# Patient Record
Sex: Female | Born: 1960
Health system: Southern US, Community
[De-identification: ages and names within clinical notes are randomized; demographics above are authoritative.]

## PROBLEM LIST (undated history)

## (undated) DIAGNOSIS — I509 Heart failure, unspecified: Secondary | ICD-10-CM

## (undated) DIAGNOSIS — M2041 Other hammer toe(s) (acquired), right foot: Secondary | ICD-10-CM

## (undated) DIAGNOSIS — Z9889 Other specified postprocedural states: Secondary | ICD-10-CM

## (undated) DIAGNOSIS — B351 Tinea unguium: Secondary | ICD-10-CM

## (undated) DIAGNOSIS — I5032 Chronic diastolic (congestive) heart failure: Secondary | ICD-10-CM

## (undated) DIAGNOSIS — K219 Gastro-esophageal reflux disease without esophagitis: Secondary | ICD-10-CM

## (undated) DIAGNOSIS — Z5189 Encounter for other specified aftercare: Secondary | ICD-10-CM

## (undated) DIAGNOSIS — N289 Disorder of kidney and ureter, unspecified: Secondary | ICD-10-CM

## (undated) DIAGNOSIS — F32A Depression, unspecified: Secondary | ICD-10-CM

## (undated) DIAGNOSIS — F419 Anxiety disorder, unspecified: Secondary | ICD-10-CM

## (undated) DIAGNOSIS — R778 Other specified abnormalities of plasma proteins: Principal | ICD-10-CM

## (undated) DIAGNOSIS — M25561 Pain in right knee: Secondary | ICD-10-CM

## (undated) DIAGNOSIS — K648 Other hemorrhoids: Secondary | ICD-10-CM

## (undated) DIAGNOSIS — D649 Anemia, unspecified: Secondary | ICD-10-CM

## (undated) DIAGNOSIS — S92353A Displaced fracture of fifth metatarsal bone, unspecified foot, initial encounter for closed fracture: Secondary | ICD-10-CM

## (undated) DIAGNOSIS — E119 Type 2 diabetes mellitus without complications: Secondary | ICD-10-CM

## (undated) DIAGNOSIS — M109 Gout, unspecified: Secondary | ICD-10-CM

## (undated) DIAGNOSIS — J449 Chronic obstructive pulmonary disease, unspecified: Secondary | ICD-10-CM

## (undated) DIAGNOSIS — Z972 Presence of dental prosthetic device (complete) (partial): Secondary | ICD-10-CM

## (undated) DIAGNOSIS — H269 Unspecified cataract: Secondary | ICD-10-CM

## (undated) DIAGNOSIS — E785 Hyperlipidemia, unspecified: Secondary | ICD-10-CM

## (undated) DIAGNOSIS — J383 Other diseases of vocal cords: Secondary | ICD-10-CM

## (undated) DIAGNOSIS — I10 Essential (primary) hypertension: Secondary | ICD-10-CM

## (undated) DIAGNOSIS — D860 Sarcoidosis of lung: Secondary | ICD-10-CM

## (undated) DIAGNOSIS — Z889 Allergy status to unspecified drugs, medicaments and biological substances status: Secondary | ICD-10-CM

## (undated) DIAGNOSIS — Z8719 Personal history of other diseases of the digestive system: Secondary | ICD-10-CM

## (undated) DIAGNOSIS — E1169 Type 2 diabetes mellitus with other specified complication: Secondary | ICD-10-CM

## (undated) DIAGNOSIS — M810 Age-related osteoporosis without current pathological fracture: Secondary | ICD-10-CM

## (undated) DIAGNOSIS — M1990 Unspecified osteoarthritis, unspecified site: Secondary | ICD-10-CM

## (undated) DIAGNOSIS — A4902 Methicillin resistant Staphylococcus aureus infection, unspecified site: Secondary | ICD-10-CM

## (undated) DIAGNOSIS — M25572 Pain in left ankle and joints of left foot: Principal | ICD-10-CM

## (undated) DIAGNOSIS — M869 Osteomyelitis, unspecified: Secondary | ICD-10-CM

## (undated) DIAGNOSIS — K760 Fatty (change of) liver, not elsewhere classified: Secondary | ICD-10-CM

## (undated) DIAGNOSIS — F329 Major depressive disorder, single episode, unspecified: Secondary | ICD-10-CM

## (undated) DIAGNOSIS — E669 Obesity, unspecified: Secondary | ICD-10-CM

## (undated) DIAGNOSIS — T7840XA Allergy, unspecified, initial encounter: Secondary | ICD-10-CM

## (undated) HISTORY — DX: Other specified abnormalities of plasma proteins: R77.8

## (undated) HISTORY — PX: ABDOMINAL HYSTERECTOMY: SHX81

## (undated) HISTORY — PX: HERNIA REPAIR: SHX51

## (undated) HISTORY — DX: Type 2 diabetes mellitus without complications: E11.9

## (undated) HISTORY — PX: TUBAL LIGATION: SHX77

## (undated) HISTORY — PX: FRACTURE SURGERY: SHX138

## (undated) HISTORY — DX: Sarcoidosis of lung: D86.0

## (undated) HISTORY — DX: Chronic diastolic (congestive) heart failure: I50.32

## (undated) HISTORY — DX: Pain in left ankle and joints of left foot: M25.572

## (undated) HISTORY — PX: APPENDECTOMY: SHX54

## (undated) HISTORY — DX: Obesity, unspecified: E66.9

## (undated) HISTORY — DX: Allergy, unspecified, initial encounter: T78.40XA

## (undated) HISTORY — DX: Unspecified cataract: H26.9

## (undated) HISTORY — DX: Displaced fracture of fifth metatarsal bone, unspecified foot, initial encounter for closed fracture: S92.353A

## (undated) HISTORY — DX: Other diseases of vocal cords: J38.3

## (undated) HISTORY — DX: Disorder of kidney and ureter, unspecified: N28.9

## (undated) HISTORY — DX: Gout, unspecified: M10.9

## (undated) HISTORY — DX: Heart failure, unspecified: I50.9

## (undated) HISTORY — DX: Type 2 diabetes mellitus with other specified complication: E11.69

## (undated) HISTORY — DX: Hyperlipidemia, unspecified: E78.5

## (undated) HISTORY — DX: Gastro-esophageal reflux disease without esophagitis: K21.9

## (undated) HISTORY — PX: JOINT REPLACEMENT: SHX530

## (undated) HISTORY — DX: Encounter for other specified aftercare: Z51.89

## (undated) HISTORY — DX: Osteomyelitis, unspecified: M86.9

## (undated) HISTORY — DX: Anemia, unspecified: D64.9

## (undated) HISTORY — PX: CATARACT EXTRACTION, BILATERAL: SHX1313

## (undated) HISTORY — DX: Fatty (change of) liver, not elsewhere classified: K76.0

## (undated) HISTORY — PX: VENTRAL HERNIA REPAIR: SHX424

## (undated) HISTORY — PX: UPPER GASTROINTESTINAL ENDOSCOPY: SHX188

## (undated) HISTORY — PX: KNEE ARTHROSCOPY: SUR90

## (undated) HISTORY — DX: Essential (primary) hypertension: I10

## (undated) HISTORY — DX: Tinea unguium: B35.1

## (undated) HISTORY — DX: Allergy status to unspecified drugs, medicaments and biological substances status: Z88.9

## (undated) HISTORY — DX: Age-related osteoporosis without current pathological fracture: M81.0

## (undated) HISTORY — DX: Pain in right knee: M25.561

## (undated) HISTORY — DX: Other hemorrhoids: K64.8

---

## 1982-10-11 HISTORY — PX: CHOLECYSTECTOMY: SHX55

## 1998-05-22 ENCOUNTER — Ambulatory Visit (HOSPITAL_COMMUNITY): Admission: RE | Admit: 1998-05-22 | Discharge: 1998-05-22 | Payer: Self-pay | Admitting: Gynecology

## 1998-10-15 ENCOUNTER — Encounter: Payer: Self-pay | Admitting: Family Medicine

## 1998-10-16 ENCOUNTER — Inpatient Hospital Stay (HOSPITAL_COMMUNITY): Admission: EM | Admit: 1998-10-16 | Discharge: 1998-10-18 | Payer: Self-pay

## 1999-01-05 ENCOUNTER — Ambulatory Visit (HOSPITAL_COMMUNITY): Admission: RE | Admit: 1999-01-05 | Discharge: 1999-01-05 | Payer: Self-pay | Admitting: Family Medicine

## 1999-01-05 ENCOUNTER — Encounter: Payer: Self-pay | Admitting: Family Medicine

## 1999-01-05 ENCOUNTER — Encounter (INDEPENDENT_AMBULATORY_CARE_PROVIDER_SITE_OTHER): Payer: Self-pay | Admitting: *Deleted

## 1999-01-20 ENCOUNTER — Emergency Department (HOSPITAL_COMMUNITY): Admission: EM | Admit: 1999-01-20 | Discharge: 1999-01-20 | Payer: Self-pay | Admitting: Emergency Medicine

## 1999-02-03 ENCOUNTER — Encounter: Payer: Self-pay | Admitting: Allergy

## 1999-02-03 ENCOUNTER — Ambulatory Visit (HOSPITAL_COMMUNITY): Admission: RE | Admit: 1999-02-03 | Discharge: 1999-02-03 | Payer: Self-pay | Admitting: Allergy

## 1999-03-15 ENCOUNTER — Inpatient Hospital Stay (HOSPITAL_COMMUNITY): Admission: EM | Admit: 1999-03-15 | Discharge: 1999-03-17 | Payer: Self-pay | Admitting: Emergency Medicine

## 1999-03-29 ENCOUNTER — Encounter: Payer: Self-pay | Admitting: Critical Care Medicine

## 1999-03-29 ENCOUNTER — Inpatient Hospital Stay (HOSPITAL_COMMUNITY): Admission: EM | Admit: 1999-03-29 | Discharge: 1999-03-30 | Payer: Self-pay | Admitting: Emergency Medicine

## 1999-04-02 ENCOUNTER — Emergency Department (HOSPITAL_COMMUNITY): Admission: EM | Admit: 1999-04-02 | Discharge: 1999-04-02 | Payer: Self-pay | Admitting: Emergency Medicine

## 1999-04-05 ENCOUNTER — Inpatient Hospital Stay (HOSPITAL_COMMUNITY): Admission: EM | Admit: 1999-04-05 | Discharge: 1999-04-07 | Payer: Self-pay | Admitting: Emergency Medicine

## 1999-04-20 ENCOUNTER — Emergency Department (HOSPITAL_COMMUNITY): Admission: EM | Admit: 1999-04-20 | Discharge: 1999-04-20 | Payer: Self-pay | Admitting: Emergency Medicine

## 1999-04-20 ENCOUNTER — Encounter: Payer: Self-pay | Admitting: Emergency Medicine

## 1999-04-22 ENCOUNTER — Encounter: Payer: Self-pay | Admitting: Internal Medicine

## 1999-04-22 ENCOUNTER — Ambulatory Visit (HOSPITAL_COMMUNITY): Admission: RE | Admit: 1999-04-22 | Discharge: 1999-04-22 | Payer: Self-pay | Admitting: Internal Medicine

## 1999-04-26 ENCOUNTER — Emergency Department (HOSPITAL_COMMUNITY): Admission: EM | Admit: 1999-04-26 | Discharge: 1999-04-26 | Payer: Self-pay | Admitting: *Deleted

## 1999-04-27 ENCOUNTER — Emergency Department (HOSPITAL_COMMUNITY): Admission: EM | Admit: 1999-04-27 | Discharge: 1999-04-27 | Payer: Self-pay | Admitting: Emergency Medicine

## 1999-05-18 ENCOUNTER — Inpatient Hospital Stay (HOSPITAL_COMMUNITY): Admission: RE | Admit: 1999-05-18 | Discharge: 1999-05-22 | Payer: Self-pay | Admitting: General Surgery

## 1999-05-18 ENCOUNTER — Encounter: Payer: Self-pay | Admitting: General Surgery

## 1999-06-12 ENCOUNTER — Ambulatory Visit (HOSPITAL_COMMUNITY): Admission: RE | Admit: 1999-06-12 | Discharge: 1999-06-12 | Payer: Self-pay | Admitting: General Surgery

## 1999-06-12 ENCOUNTER — Encounter: Payer: Self-pay | Admitting: General Surgery

## 1999-06-12 ENCOUNTER — Encounter (INDEPENDENT_AMBULATORY_CARE_PROVIDER_SITE_OTHER): Payer: Self-pay | Admitting: *Deleted

## 1999-08-04 ENCOUNTER — Encounter: Payer: Self-pay | Admitting: Cardiology

## 1999-08-04 ENCOUNTER — Encounter: Admission: RE | Admit: 1999-08-04 | Discharge: 1999-08-04 | Payer: Self-pay | Admitting: Cardiology

## 1999-09-24 ENCOUNTER — Other Ambulatory Visit: Admission: RE | Admit: 1999-09-24 | Discharge: 1999-09-24 | Payer: Self-pay | Admitting: Gynecology

## 1999-11-12 ENCOUNTER — Encounter: Payer: Self-pay | Admitting: General Surgery

## 1999-11-12 ENCOUNTER — Ambulatory Visit (HOSPITAL_COMMUNITY): Admission: RE | Admit: 1999-11-12 | Discharge: 1999-11-12 | Payer: Self-pay | Admitting: General Surgery

## 1999-12-09 ENCOUNTER — Ambulatory Visit (HOSPITAL_COMMUNITY): Admission: RE | Admit: 1999-12-09 | Discharge: 1999-12-09 | Payer: Self-pay | Admitting: Legal Medicine

## 1999-12-09 ENCOUNTER — Encounter (INDEPENDENT_AMBULATORY_CARE_PROVIDER_SITE_OTHER): Payer: Self-pay | Admitting: *Deleted

## 1999-12-11 ENCOUNTER — Emergency Department (HOSPITAL_COMMUNITY): Admission: EM | Admit: 1999-12-11 | Discharge: 1999-12-11 | Payer: Self-pay | Admitting: Emergency Medicine

## 2000-01-28 ENCOUNTER — Encounter: Payer: Self-pay | Admitting: General Surgery

## 2000-01-28 ENCOUNTER — Ambulatory Visit (HOSPITAL_COMMUNITY): Admission: RE | Admit: 2000-01-28 | Discharge: 2000-01-28 | Payer: Self-pay | Admitting: General Surgery

## 2000-02-01 ENCOUNTER — Encounter: Payer: Self-pay | Admitting: General Surgery

## 2000-02-01 ENCOUNTER — Ambulatory Visit (HOSPITAL_COMMUNITY): Admission: RE | Admit: 2000-02-01 | Discharge: 2000-02-01 | Payer: Self-pay | Admitting: General Surgery

## 2000-03-03 ENCOUNTER — Encounter (INDEPENDENT_AMBULATORY_CARE_PROVIDER_SITE_OTHER): Payer: Self-pay | Admitting: *Deleted

## 2000-03-03 ENCOUNTER — Ambulatory Visit (HOSPITAL_COMMUNITY): Admission: RE | Admit: 2000-03-03 | Discharge: 2000-03-04 | Payer: Self-pay | Admitting: General Surgery

## 2000-08-04 ENCOUNTER — Emergency Department (HOSPITAL_COMMUNITY): Admission: EM | Admit: 2000-08-04 | Discharge: 2000-08-04 | Payer: Self-pay | Admitting: Emergency Medicine

## 2000-08-04 ENCOUNTER — Encounter: Payer: Self-pay | Admitting: Emergency Medicine

## 2000-08-08 ENCOUNTER — Ambulatory Visit (HOSPITAL_COMMUNITY): Admission: RE | Admit: 2000-08-08 | Discharge: 2000-08-08 | Payer: Self-pay | Admitting: General Surgery

## 2000-08-08 ENCOUNTER — Encounter: Payer: Self-pay | Admitting: General Surgery

## 2000-08-25 ENCOUNTER — Ambulatory Visit (HOSPITAL_COMMUNITY): Admission: RE | Admit: 2000-08-25 | Discharge: 2000-08-25 | Payer: Self-pay | Admitting: Critical Care Medicine

## 2000-12-02 ENCOUNTER — Encounter: Payer: Self-pay | Admitting: Emergency Medicine

## 2000-12-02 ENCOUNTER — Emergency Department (HOSPITAL_COMMUNITY): Admission: EM | Admit: 2000-12-02 | Discharge: 2000-12-02 | Payer: Self-pay | Admitting: Emergency Medicine

## 2001-11-07 ENCOUNTER — Encounter: Admission: RE | Admit: 2001-11-07 | Discharge: 2001-11-07 | Payer: Self-pay | Admitting: Critical Care Medicine

## 2001-11-07 ENCOUNTER — Encounter: Payer: Self-pay | Admitting: Critical Care Medicine

## 2001-11-25 ENCOUNTER — Inpatient Hospital Stay (HOSPITAL_COMMUNITY): Admission: EM | Admit: 2001-11-25 | Discharge: 2001-11-26 | Payer: Self-pay

## 2001-11-25 ENCOUNTER — Encounter: Payer: Self-pay | Admitting: Internal Medicine

## 2001-12-05 ENCOUNTER — Other Ambulatory Visit: Admission: RE | Admit: 2001-12-05 | Discharge: 2001-12-05 | Payer: Self-pay | Admitting: Gynecology

## 2001-12-05 ENCOUNTER — Emergency Department (HOSPITAL_COMMUNITY): Admission: EM | Admit: 2001-12-05 | Discharge: 2001-12-05 | Payer: Self-pay | Admitting: Emergency Medicine

## 2001-12-05 ENCOUNTER — Encounter: Payer: Self-pay | Admitting: Emergency Medicine

## 2002-01-23 ENCOUNTER — Encounter: Payer: Self-pay | Admitting: Gynecology

## 2002-01-23 ENCOUNTER — Ambulatory Visit (HOSPITAL_COMMUNITY): Admission: RE | Admit: 2002-01-23 | Discharge: 2002-01-23 | Payer: Self-pay | Admitting: Gynecology

## 2002-02-05 ENCOUNTER — Encounter: Payer: Self-pay | Admitting: Critical Care Medicine

## 2002-02-05 ENCOUNTER — Encounter: Admission: RE | Admit: 2002-02-05 | Discharge: 2002-02-05 | Payer: Self-pay | Admitting: Critical Care Medicine

## 2002-03-09 ENCOUNTER — Encounter: Payer: Self-pay | Admitting: General Surgery

## 2002-03-14 ENCOUNTER — Encounter (INDEPENDENT_AMBULATORY_CARE_PROVIDER_SITE_OTHER): Payer: Self-pay | Admitting: *Deleted

## 2002-03-14 ENCOUNTER — Inpatient Hospital Stay (HOSPITAL_COMMUNITY): Admission: RE | Admit: 2002-03-14 | Discharge: 2002-03-16 | Payer: Self-pay | Admitting: General Surgery

## 2002-03-14 ENCOUNTER — Encounter: Payer: Self-pay | Admitting: General Surgery

## 2002-07-09 ENCOUNTER — Encounter: Admission: RE | Admit: 2002-07-09 | Discharge: 2002-07-09 | Payer: Self-pay | Admitting: Critical Care Medicine

## 2002-07-09 ENCOUNTER — Encounter: Payer: Self-pay | Admitting: Critical Care Medicine

## 2002-07-25 ENCOUNTER — Emergency Department (HOSPITAL_COMMUNITY): Admission: EM | Admit: 2002-07-25 | Discharge: 2002-07-25 | Payer: Self-pay | Admitting: Emergency Medicine

## 2002-07-26 ENCOUNTER — Encounter: Payer: Self-pay | Admitting: Emergency Medicine

## 2002-07-31 ENCOUNTER — Encounter: Payer: Self-pay | Admitting: Internal Medicine

## 2002-10-01 ENCOUNTER — Ambulatory Visit (HOSPITAL_COMMUNITY): Admission: RE | Admit: 2002-10-01 | Discharge: 2002-10-01 | Payer: Self-pay | Admitting: Orthopedic Surgery

## 2002-10-11 HISTORY — PX: NISSEN FUNDOPLICATION: SHX2091

## 2002-11-15 ENCOUNTER — Encounter (INDEPENDENT_AMBULATORY_CARE_PROVIDER_SITE_OTHER): Payer: Self-pay

## 2002-11-15 ENCOUNTER — Inpatient Hospital Stay (HOSPITAL_COMMUNITY): Admission: RE | Admit: 2002-11-15 | Discharge: 2002-11-17 | Payer: Self-pay | Admitting: Gynecology

## 2002-12-25 ENCOUNTER — Encounter: Admission: RE | Admit: 2002-12-25 | Discharge: 2003-03-25 | Payer: Self-pay | Admitting: Internal Medicine

## 2003-01-25 ENCOUNTER — Encounter: Payer: Self-pay | Admitting: Gynecology

## 2003-01-25 ENCOUNTER — Ambulatory Visit (HOSPITAL_COMMUNITY): Admission: RE | Admit: 2003-01-25 | Discharge: 2003-01-25 | Payer: Self-pay | Admitting: Gynecology

## 2003-04-16 ENCOUNTER — Encounter: Admission: RE | Admit: 2003-04-16 | Discharge: 2003-07-15 | Payer: Self-pay | Admitting: Internal Medicine

## 2003-08-18 ENCOUNTER — Observation Stay (HOSPITAL_COMMUNITY): Admission: EM | Admit: 2003-08-18 | Discharge: 2003-08-19 | Payer: Self-pay | Admitting: Emergency Medicine

## 2003-09-11 ENCOUNTER — Encounter: Payer: Self-pay | Admitting: Internal Medicine

## 2003-12-01 ENCOUNTER — Emergency Department (HOSPITAL_COMMUNITY): Admission: EM | Admit: 2003-12-01 | Discharge: 2003-12-01 | Payer: Self-pay | Admitting: Emergency Medicine

## 2004-01-01 ENCOUNTER — Ambulatory Visit (HOSPITAL_COMMUNITY): Admission: RE | Admit: 2004-01-01 | Discharge: 2004-01-01 | Payer: Self-pay | Admitting: Internal Medicine

## 2004-01-01 ENCOUNTER — Encounter (INDEPENDENT_AMBULATORY_CARE_PROVIDER_SITE_OTHER): Payer: Self-pay | Admitting: *Deleted

## 2004-06-15 ENCOUNTER — Emergency Department (HOSPITAL_COMMUNITY): Admission: EM | Admit: 2004-06-15 | Discharge: 2004-06-15 | Payer: Self-pay | Admitting: Emergency Medicine

## 2004-07-17 ENCOUNTER — Ambulatory Visit (HOSPITAL_COMMUNITY): Admission: RE | Admit: 2004-07-17 | Discharge: 2004-07-17 | Payer: Self-pay | Admitting: Orthopedic Surgery

## 2004-08-18 ENCOUNTER — Ambulatory Visit (HOSPITAL_COMMUNITY): Admission: RE | Admit: 2004-08-18 | Discharge: 2004-08-18 | Payer: Self-pay | Admitting: Internal Medicine

## 2004-08-18 ENCOUNTER — Ambulatory Visit: Payer: Self-pay | Admitting: Internal Medicine

## 2004-09-14 ENCOUNTER — Ambulatory Visit: Payer: Self-pay | Admitting: Internal Medicine

## 2004-09-15 ENCOUNTER — Ambulatory Visit: Payer: Self-pay | Admitting: Family Medicine

## 2004-09-21 ENCOUNTER — Ambulatory Visit: Payer: Self-pay | Admitting: Internal Medicine

## 2004-09-23 ENCOUNTER — Ambulatory Visit: Payer: Self-pay | Admitting: Internal Medicine

## 2004-10-01 ENCOUNTER — Ambulatory Visit: Payer: Self-pay | Admitting: Internal Medicine

## 2004-10-09 ENCOUNTER — Ambulatory Visit: Payer: Self-pay | Admitting: Critical Care Medicine

## 2004-10-21 ENCOUNTER — Ambulatory Visit: Payer: Self-pay | Admitting: Internal Medicine

## 2004-11-04 ENCOUNTER — Ambulatory Visit: Payer: Self-pay | Admitting: Internal Medicine

## 2004-11-19 ENCOUNTER — Ambulatory Visit: Payer: Self-pay | Admitting: Internal Medicine

## 2004-11-26 ENCOUNTER — Ambulatory Visit: Payer: Self-pay | Admitting: Internal Medicine

## 2004-11-29 ENCOUNTER — Emergency Department (HOSPITAL_COMMUNITY): Admission: EM | Admit: 2004-11-29 | Discharge: 2004-11-29 | Payer: Self-pay | Admitting: Emergency Medicine

## 2004-12-03 ENCOUNTER — Ambulatory Visit: Payer: Self-pay | Admitting: Internal Medicine

## 2004-12-09 ENCOUNTER — Encounter: Admission: RE | Admit: 2004-12-09 | Discharge: 2005-03-09 | Payer: Self-pay | Admitting: Internal Medicine

## 2004-12-09 ENCOUNTER — Ambulatory Visit: Payer: Self-pay | Admitting: Critical Care Medicine

## 2004-12-17 ENCOUNTER — Ambulatory Visit: Payer: Self-pay | Admitting: Internal Medicine

## 2004-12-29 ENCOUNTER — Ambulatory Visit: Payer: Self-pay | Admitting: Internal Medicine

## 2005-01-08 ENCOUNTER — Ambulatory Visit: Payer: Self-pay | Admitting: Critical Care Medicine

## 2005-01-13 ENCOUNTER — Ambulatory Visit: Payer: Self-pay | Admitting: Internal Medicine

## 2005-01-20 ENCOUNTER — Ambulatory Visit: Payer: Self-pay | Admitting: Internal Medicine

## 2005-01-27 ENCOUNTER — Ambulatory Visit: Payer: Self-pay | Admitting: Internal Medicine

## 2005-02-09 ENCOUNTER — Ambulatory Visit: Payer: Self-pay | Admitting: Internal Medicine

## 2005-02-17 ENCOUNTER — Ambulatory Visit: Payer: Self-pay | Admitting: Internal Medicine

## 2005-02-19 ENCOUNTER — Ambulatory Visit: Payer: Self-pay | Admitting: Internal Medicine

## 2005-02-26 ENCOUNTER — Ambulatory Visit: Payer: Self-pay | Admitting: Critical Care Medicine

## 2005-03-12 ENCOUNTER — Ambulatory Visit: Payer: Self-pay | Admitting: Internal Medicine

## 2005-03-31 ENCOUNTER — Ambulatory Visit: Payer: Self-pay | Admitting: *Deleted

## 2005-03-31 ENCOUNTER — Ambulatory Visit: Payer: Self-pay | Admitting: Internal Medicine

## 2005-04-14 ENCOUNTER — Other Ambulatory Visit: Admission: RE | Admit: 2005-04-14 | Discharge: 2005-04-14 | Payer: Self-pay | Admitting: Gynecology

## 2005-04-23 ENCOUNTER — Ambulatory Visit: Payer: Self-pay | Admitting: Internal Medicine

## 2005-04-28 ENCOUNTER — Ambulatory Visit: Payer: Self-pay | Admitting: Critical Care Medicine

## 2005-05-10 ENCOUNTER — Ambulatory Visit: Payer: Self-pay | Admitting: Internal Medicine

## 2005-05-12 ENCOUNTER — Ambulatory Visit: Payer: Self-pay | Admitting: Internal Medicine

## 2005-05-13 ENCOUNTER — Ambulatory Visit: Payer: Self-pay | Admitting: Internal Medicine

## 2005-05-31 ENCOUNTER — Ambulatory Visit: Payer: Self-pay | Admitting: Internal Medicine

## 2005-06-24 ENCOUNTER — Ambulatory Visit: Payer: Self-pay | Admitting: Internal Medicine

## 2005-07-05 ENCOUNTER — Ambulatory Visit: Payer: Self-pay | Admitting: Internal Medicine

## 2005-07-12 ENCOUNTER — Ambulatory Visit: Payer: Self-pay | Admitting: Internal Medicine

## 2005-07-21 ENCOUNTER — Ambulatory Visit: Payer: Self-pay | Admitting: Internal Medicine

## 2005-07-23 ENCOUNTER — Ambulatory Visit: Payer: Self-pay | Admitting: Critical Care Medicine

## 2005-07-26 ENCOUNTER — Ambulatory Visit (HOSPITAL_BASED_OUTPATIENT_CLINIC_OR_DEPARTMENT_OTHER): Admission: RE | Admit: 2005-07-26 | Discharge: 2005-07-26 | Payer: Self-pay | Admitting: Internal Medicine

## 2005-08-07 ENCOUNTER — Ambulatory Visit: Payer: Self-pay | Admitting: Pulmonary Disease

## 2005-08-10 ENCOUNTER — Ambulatory Visit: Payer: Self-pay | Admitting: Internal Medicine

## 2005-08-31 ENCOUNTER — Ambulatory Visit: Payer: Self-pay | Admitting: Internal Medicine

## 2005-09-09 ENCOUNTER — Ambulatory Visit: Payer: Self-pay | Admitting: Internal Medicine

## 2005-09-13 ENCOUNTER — Ambulatory Visit: Payer: Self-pay | Admitting: Internal Medicine

## 2005-09-21 ENCOUNTER — Ambulatory Visit: Payer: Self-pay | Admitting: Critical Care Medicine

## 2005-10-05 ENCOUNTER — Ambulatory Visit: Payer: Self-pay | Admitting: Internal Medicine

## 2005-10-09 IMAGING — CR DG CHEST 2V
2 series · 2 of 2 positions shown · non-contrast
Comparison: None.

CLINICAL DATA: Cough, wheezing, chest pain, shortness of breath.

CHEST - 2 VIEW  11/29/2004:

[view not recorded (1 of 2)]
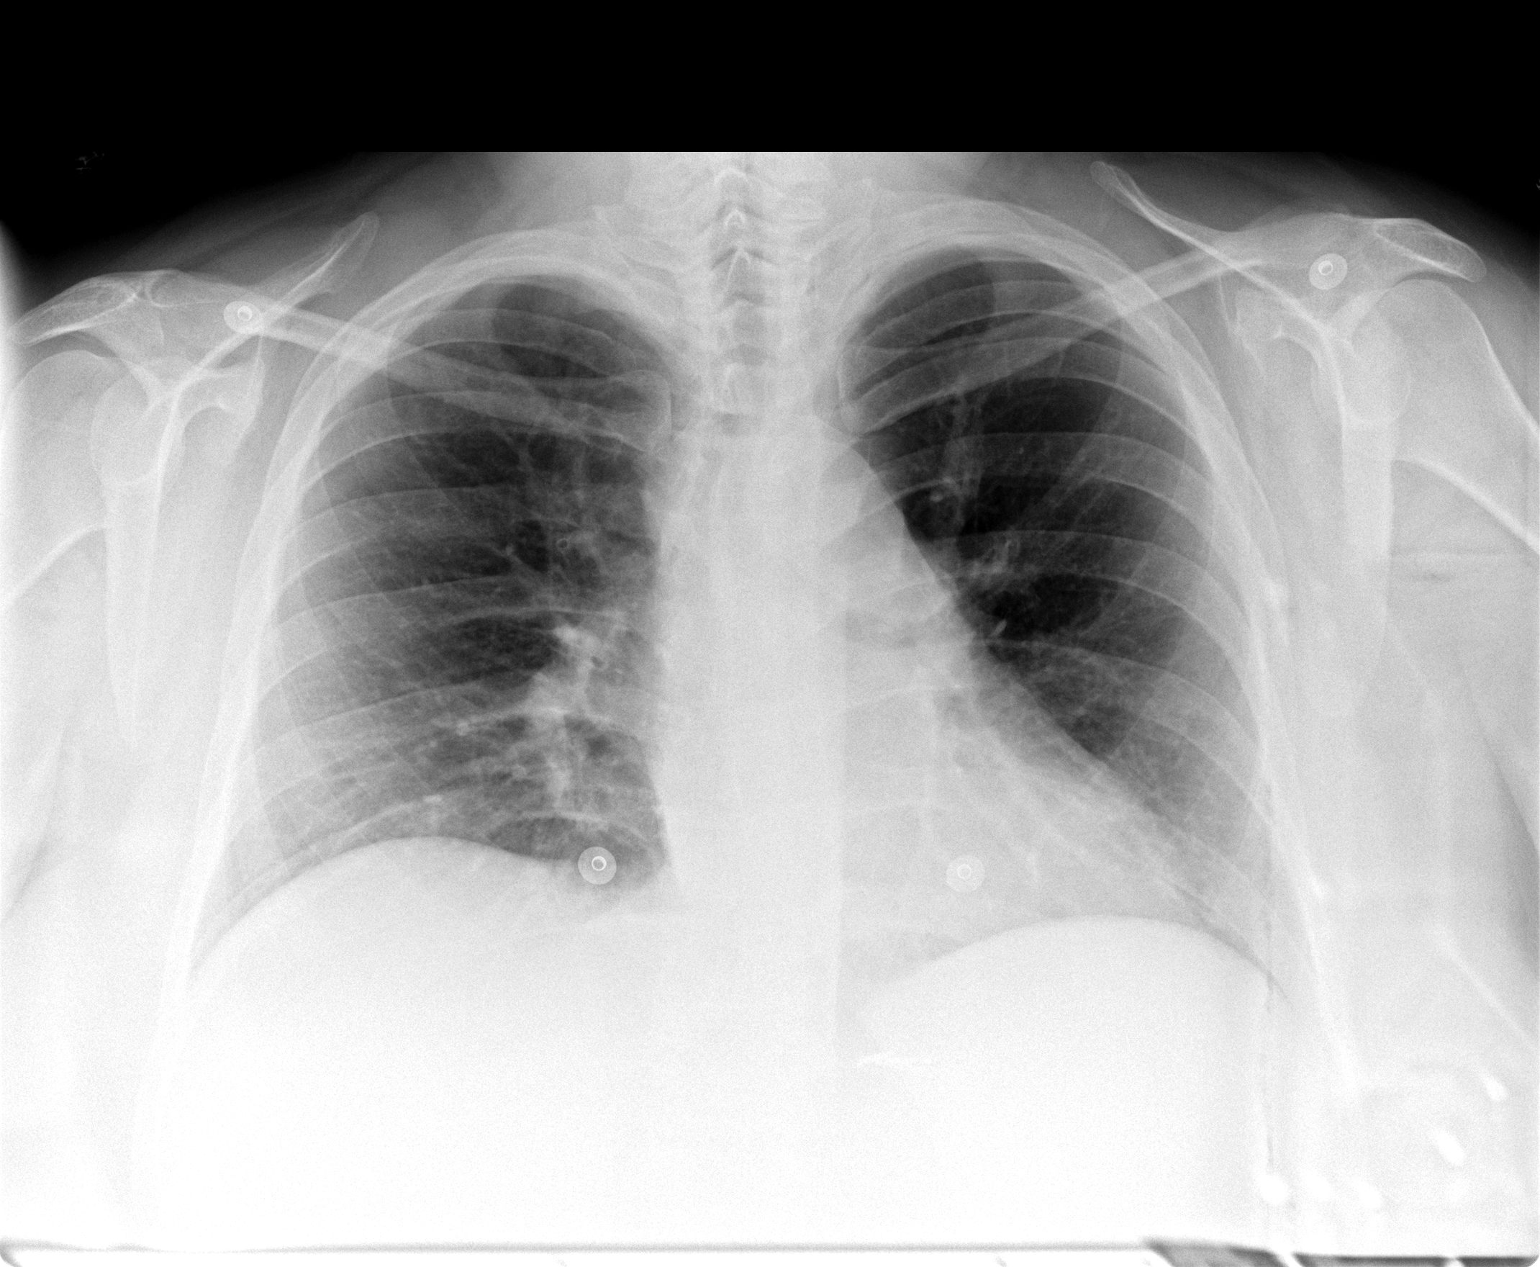

[view not recorded (2 of 2)]
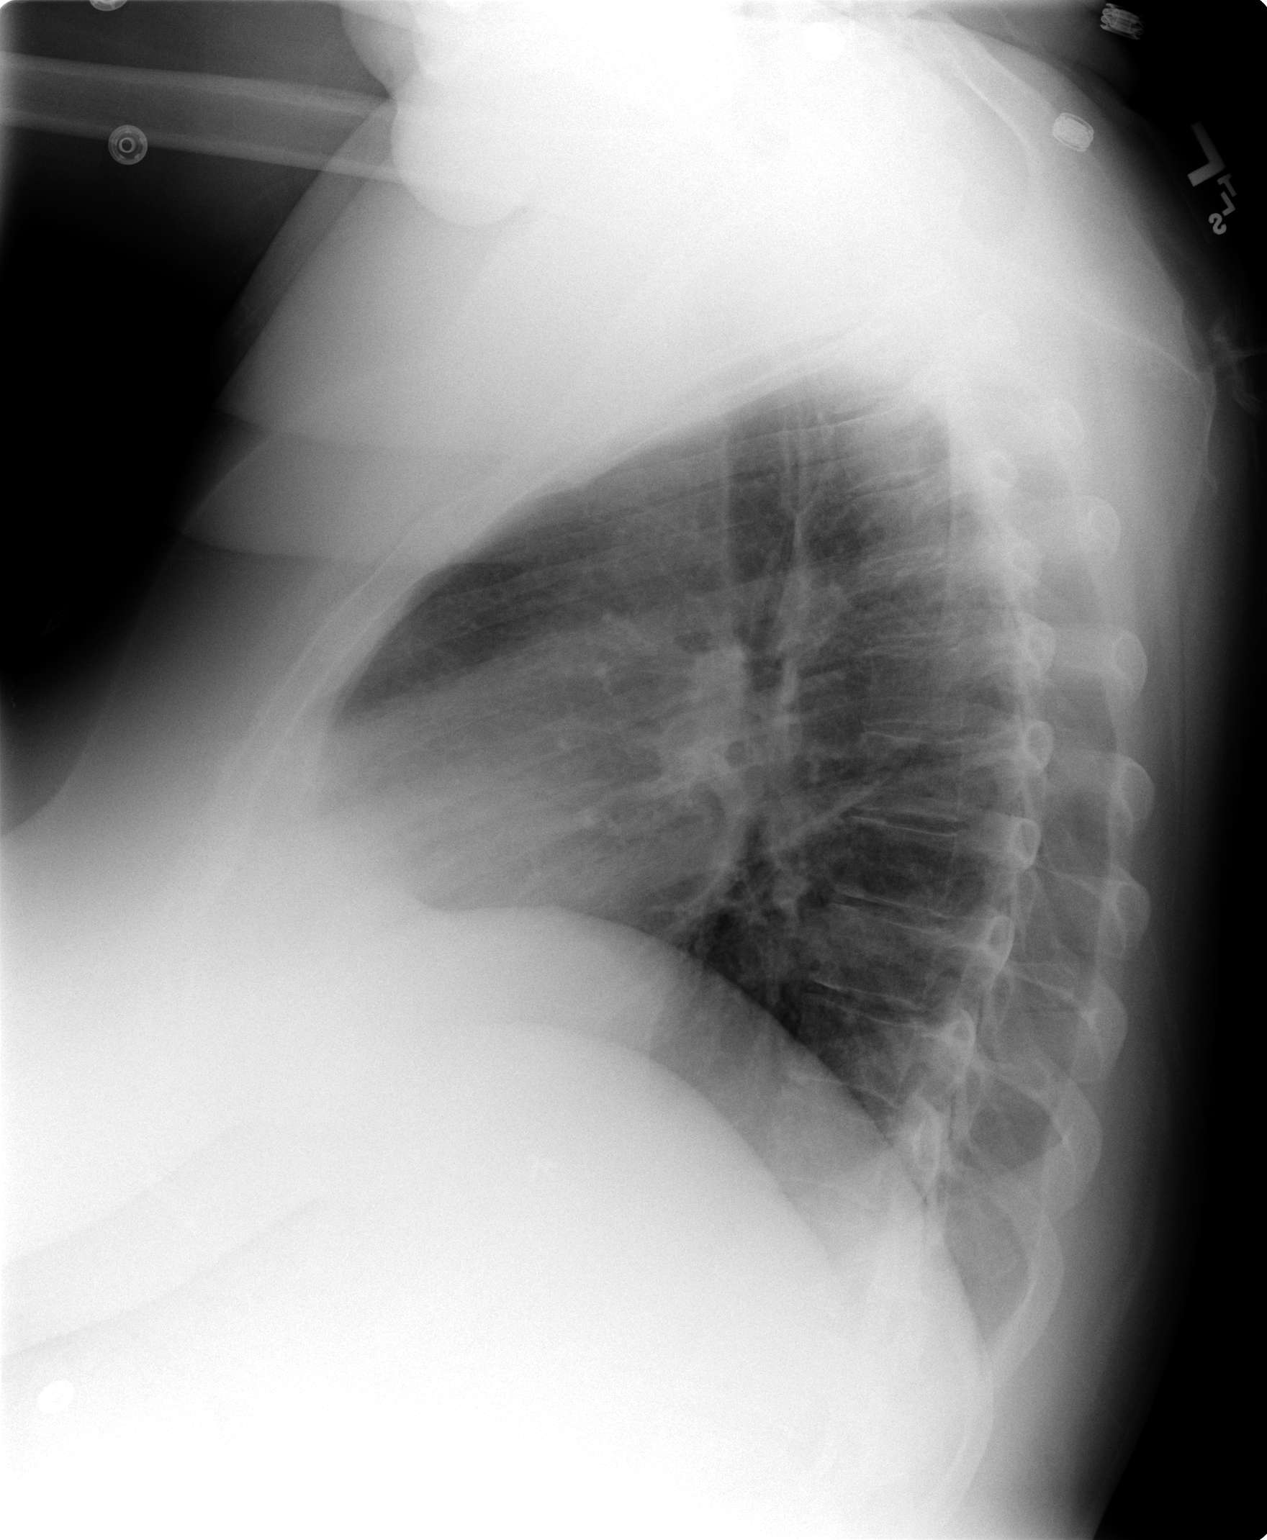

[2 of 2 positions shown; findings below may reference images not displayed]

FINDINGS: Inspiration is suboptimal in this accounts for crowding of the
bronchovascular markings in the bases. It also accentuates the heart size which
I believe is normal. The hilar and mediastinal contours are unremarkable. The
lungs appear clear. Mild central peribronchial thickening is present. Visualized
bony thorax appears intact.
IMPRESSION: Suboptimal inspiration. Mild changes of asthma versus bronchitis without
localized consolidation.

## 2005-10-19 ENCOUNTER — Ambulatory Visit: Payer: Self-pay | Admitting: Critical Care Medicine

## 2005-10-20 ENCOUNTER — Ambulatory Visit: Payer: Self-pay | Admitting: Internal Medicine

## 2005-10-25 ENCOUNTER — Observation Stay (HOSPITAL_COMMUNITY): Admission: EM | Admit: 2005-10-25 | Discharge: 2005-10-26 | Payer: Self-pay | Admitting: Emergency Medicine

## 2005-10-25 ENCOUNTER — Ambulatory Visit: Payer: Self-pay | Admitting: Internal Medicine

## 2005-10-26 ENCOUNTER — Encounter (INDEPENDENT_AMBULATORY_CARE_PROVIDER_SITE_OTHER): Payer: Self-pay | Admitting: *Deleted

## 2005-11-02 ENCOUNTER — Ambulatory Visit: Payer: Self-pay | Admitting: Internal Medicine

## 2005-11-04 ENCOUNTER — Ambulatory Visit: Payer: Self-pay

## 2005-11-08 ENCOUNTER — Ambulatory Visit: Payer: Self-pay

## 2005-11-11 ENCOUNTER — Ambulatory Visit: Payer: Self-pay | Admitting: Internal Medicine

## 2005-11-12 ENCOUNTER — Ambulatory Visit: Payer: Self-pay | Admitting: Cardiology

## 2005-11-17 ENCOUNTER — Ambulatory Visit: Payer: Self-pay | Admitting: Internal Medicine

## 2005-11-24 ENCOUNTER — Ambulatory Visit: Payer: Self-pay | Admitting: Internal Medicine

## 2005-12-03 ENCOUNTER — Ambulatory Visit: Payer: Self-pay | Admitting: Internal Medicine

## 2005-12-14 ENCOUNTER — Ambulatory Visit: Payer: Self-pay | Admitting: Critical Care Medicine

## 2005-12-17 ENCOUNTER — Ambulatory Visit: Payer: Self-pay | Admitting: Internal Medicine

## 2005-12-27 ENCOUNTER — Ambulatory Visit: Payer: Self-pay | Admitting: Endocrinology

## 2006-01-10 ENCOUNTER — Ambulatory Visit: Payer: Self-pay | Admitting: Endocrinology

## 2006-01-19 ENCOUNTER — Ambulatory Visit: Payer: Self-pay | Admitting: Internal Medicine

## 2006-01-26 ENCOUNTER — Ambulatory Visit: Payer: Self-pay | Admitting: Internal Medicine

## 2006-02-08 IMAGING — CT CT PELVIS W/ CM
1 of 4 series · 13 of 32 positions shown, 18 images · IV contrast (agent unspecified)
Comparison: None.

<!--  IDXRADR:ADDEND:BEGIN -->Addendum Begins<!--  IDXRADR:ADDEND:INNER_BEGIN -->After reviewing the patient?s clinical history, I realized that she no longer has her left ovary.  Therefore, the apparent cystic lesion in the left adnexa may likely represent a cystic or necrotic mass in addition to that seen at the apex of the vaginal cuff.  

 <!--  IDXRADR:ADDEND:INNER_END -->Addendum Ends
<!--  IDXRADR:ADDEND:END -->Clinical data:  Left lower quadrant abdominal pain, nausea, diarrhea. Surgical
history includes hysterectomy, left salpingo-oophorectomy, right salpingectomy,
cholecystectomy, hernia repair, appendectomy, and fundoplication.
CT ABDOMEN AND PELVIS WITH CONTRAST 03/31/2005:
TECHNIQUE: Multidetector helical CT of the abdomen and pelvis was performed
during bolus administration of intravenous contrast. Oral contrast was given.
Delayed imaging through the kidneys and bladder was performed.
Contrast:  125 cc Imnipaque-844.

[Series 2: abd_pel_xxl 5.0 b10f st · axial · 0.89mm/px · z∈[-494,-94]mm · 13 of 92 slices shown, 18 images]
[im 6/92  soft-tissue]
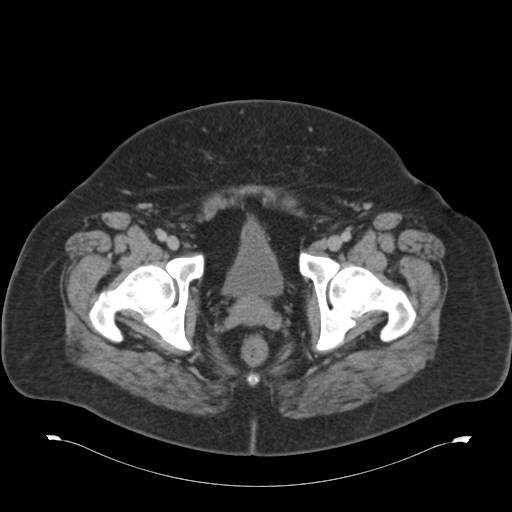
[im 6/92  bone]
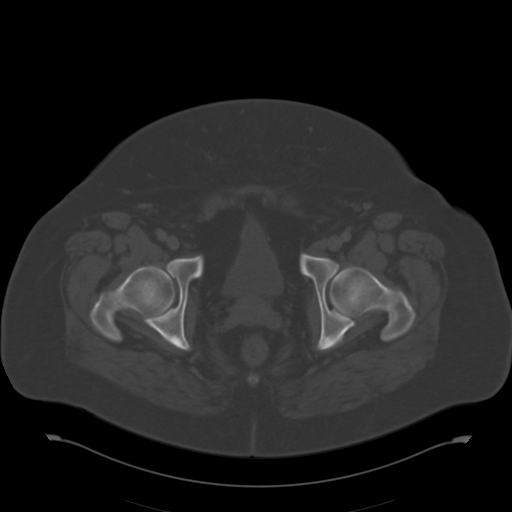
[im 17/92  soft-tissue]
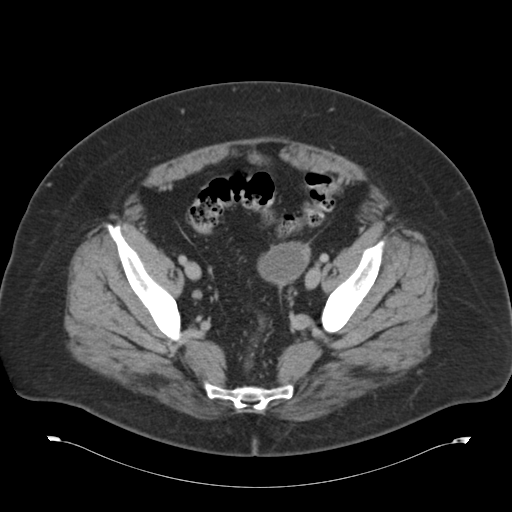
[im 22/92  soft-tissue]
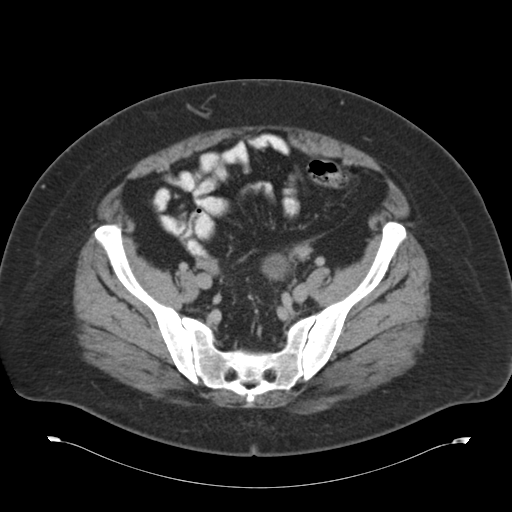
[im 27/92  soft-tissue]
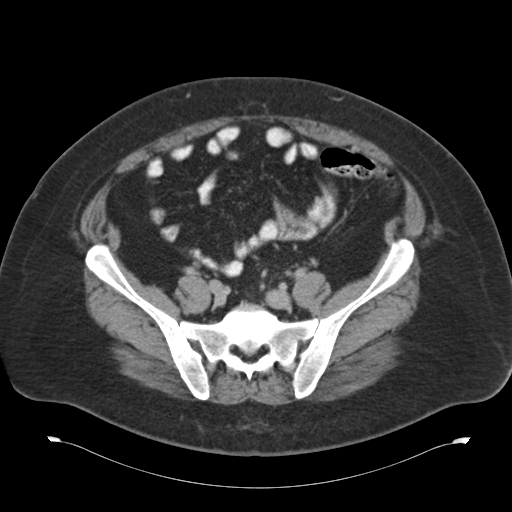
[im 38/92  soft-tissue]
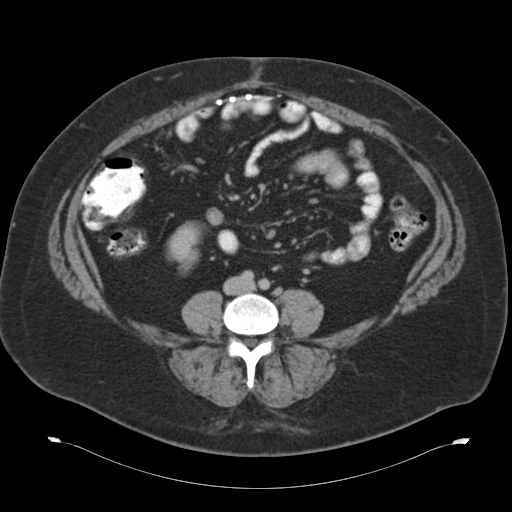
[im 43/92  soft-tissue]
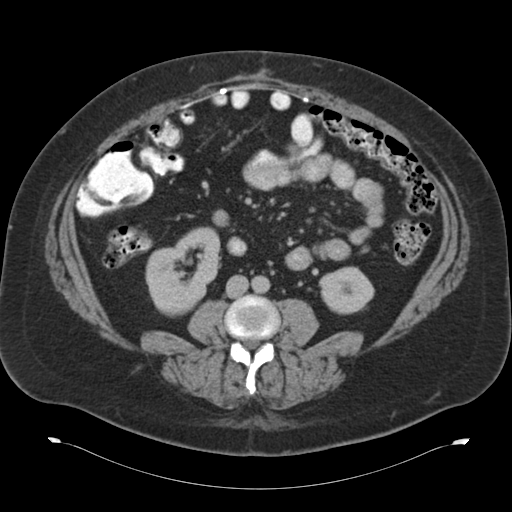
[im 49/92  soft-tissue]
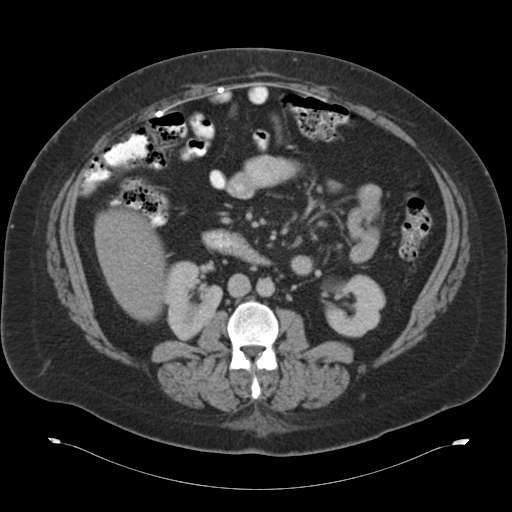
[im 59/92  soft-tissue]
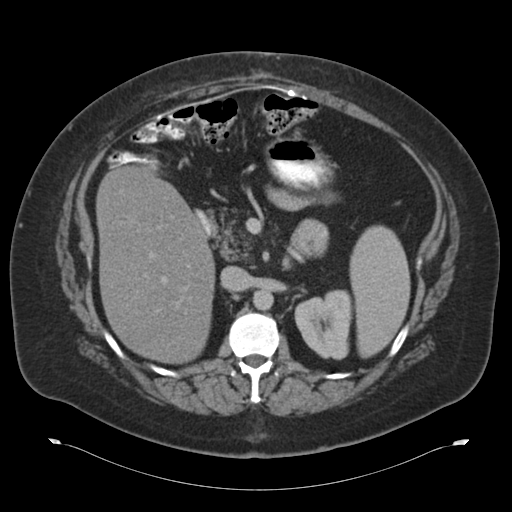
[im 65/92  soft-tissue]
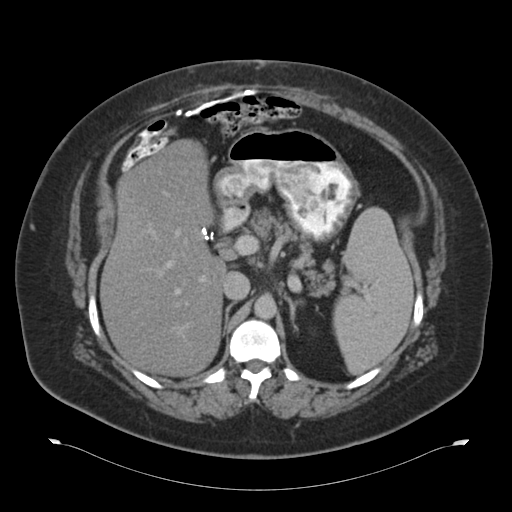
[im 65/92  bone]
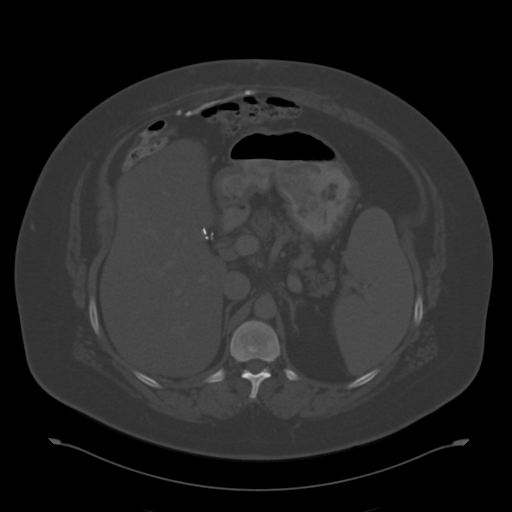
[im 70/92  soft-tissue]
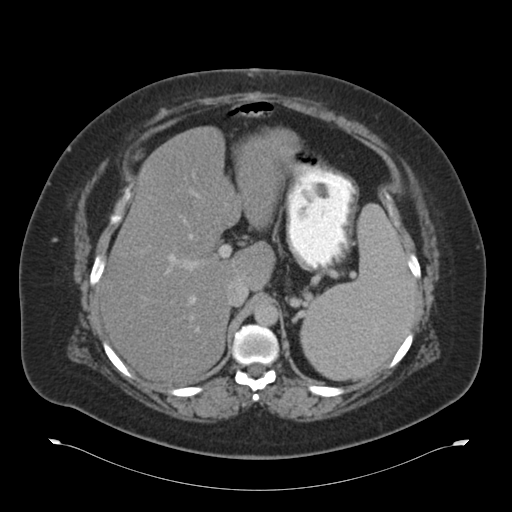
[im 70/92  lung]
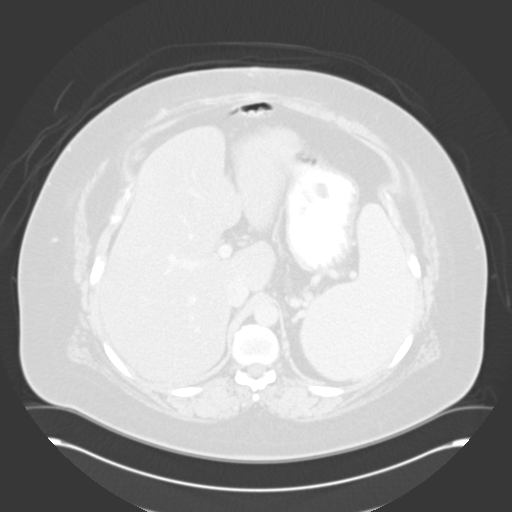
[im 75/92  lung]
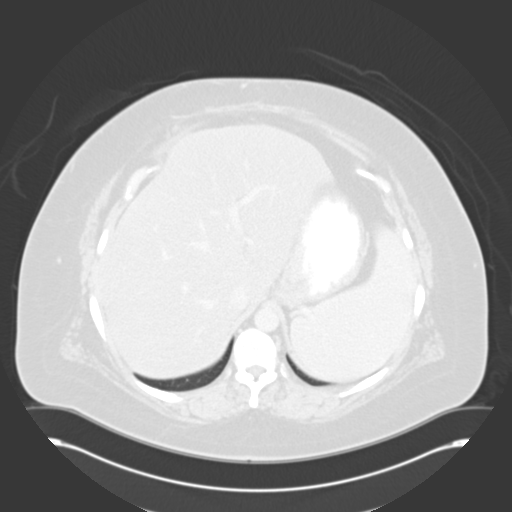
[im 81/92  soft-tissue]
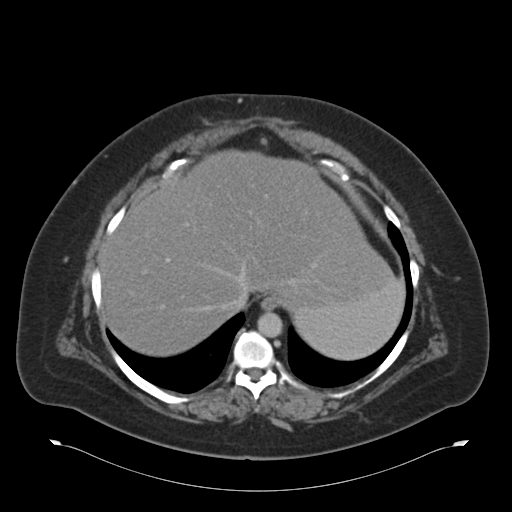
[im 81/92  lung]
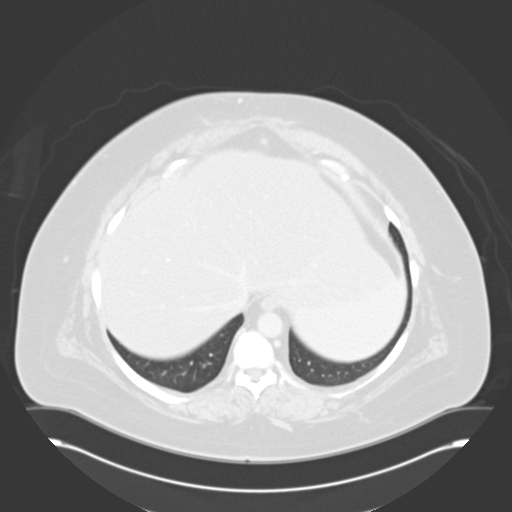
[im 86/92  soft-tissue]
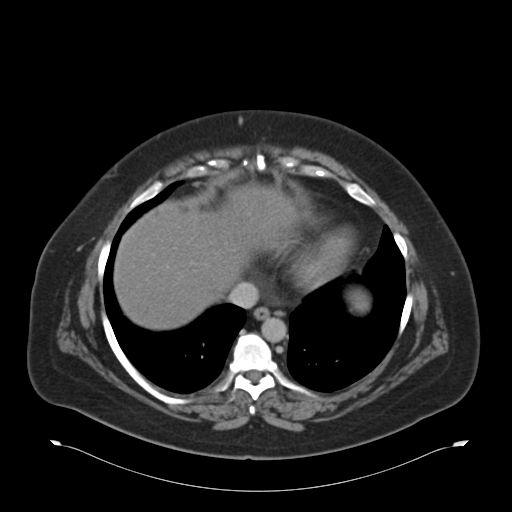
[im 86/92  lung]
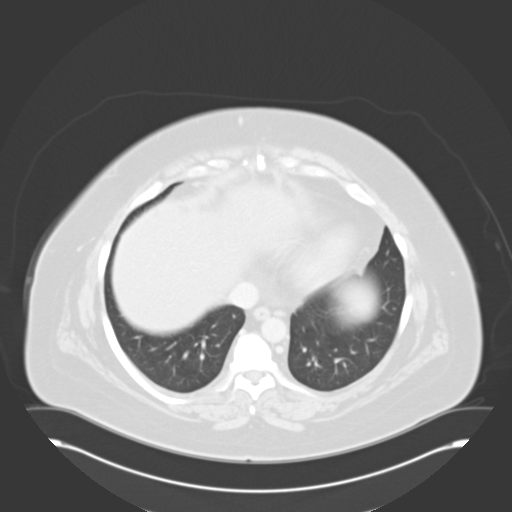

[13 of 32 positions shown; findings below may reference images not displayed]

I have the report for a previous CT abdomen and pelvis
08/08/2000 [HOSPITAL] describing a ventral hernia and no other acute
abnormalities.

CT ABDOMEN:

Mild diffuse fatty infiltration of the liver is present. There are no focal
hepatic abnormalities. The gallbladder is surgically absent. There is no biliary
ductal dilation. The spleen is upper normal in size to perhaps mildly enlarged.
The pancreas is mildly atrophic and replaced by fat. Surgical clips and the
esophagogastric junction are consistent with the prior fundoplication; I do not
see evidence for a recurrent hiatal hernia. The colon and the small bowel are
unremarkable in the upper abdomen. Both adrenal glands and both kidneys are
unremarkable. There is no significant lymphadenopathy. There is no ascites. The
abdominal aorta is normal in appearance. The visualized lung bases appear clear.
IMPRESSION: 1. Diffuse fatty infiltration of the liver without focal hepatic abnormality.
Borderline hepatomegaly.
2. Borderline to mild splenomegaly.
3. Mild pancreatic atrophy.

CT PELVIS:

There is a large left ovarian cyst measuring approximately 4.7 x 4.0 cm. There
is abnormal soft tissue superior to the vaginal cuff, situated between the
rectum and the bladder; this soft tissue mass measures approximately 5.6 x
cm. There is no significant lymphadenopathy. There is no ascites. The urinary
bladder is decompressed but otherwise normal. The colon and small bowel are
unremarkable.
IMPRESSION: 1. Soft tissue mass in the midline of the pelvis, superior to the vaginal cuff
and situated between the rectum and bladder. Measurements are given above.
2. Approximately 4-5 cm left ovarian cyst.

## 2006-02-09 ENCOUNTER — Encounter: Payer: Self-pay | Admitting: Emergency Medicine

## 2006-02-10 ENCOUNTER — Ambulatory Visit: Payer: Self-pay | Admitting: Critical Care Medicine

## 2006-02-11 ENCOUNTER — Ambulatory Visit: Payer: Self-pay | Admitting: Internal Medicine

## 2006-02-15 ENCOUNTER — Ambulatory Visit: Payer: Self-pay | Admitting: Endocrinology

## 2006-02-18 ENCOUNTER — Ambulatory Visit: Payer: Self-pay | Admitting: Internal Medicine

## 2006-03-21 ENCOUNTER — Ambulatory Visit: Payer: Self-pay | Admitting: Endocrinology

## 2006-03-21 ENCOUNTER — Emergency Department (HOSPITAL_COMMUNITY): Admission: EM | Admit: 2006-03-21 | Discharge: 2006-03-21 | Payer: Self-pay | Admitting: Family Medicine

## 2006-03-23 IMAGING — CT CT PELVIS W/ CM
1 of 4 series · 13 of 32 positions shown, 18 images · IV contrast (omnipaque)
Comparison: 03/31/05

CLINICAL DATA: Lower abdominal pain, diarrhea, weight loss, partial hysterectomy, left salpingo-oophorectomy, right salpingectomy, fundoplication, ventral hernia, cholecystectomy
 ABDOMEN CT WITH CONTRAST:
TECHNIQUE: Multidetector CT imaging of the abdomen was performed following the standard protocol during bolus administration of intravenous contrast.
 Contrast:  125 cc Omnipaque 300
TECHNIQUE: Multidetector CT imaging of the pelvis was performed following the standard protocol during bolus administration of intravenous contrast.

[Series 2: abd_pel_xxl 5.0 b10f st · axial · 0.88mm/px · z∈[-476,-56]mm · 13 of 96 slices shown, 18 images]
[im 6/96  soft-tissue]
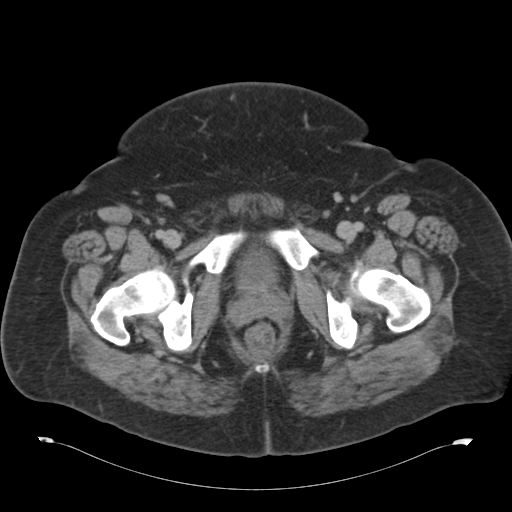
[im 6/96  bone]
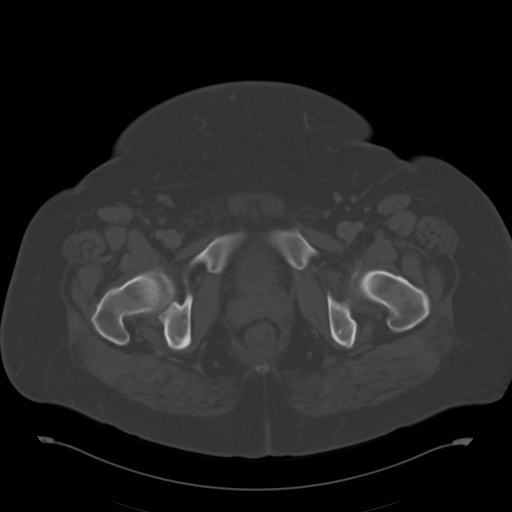
[im 16/96  soft-tissue]
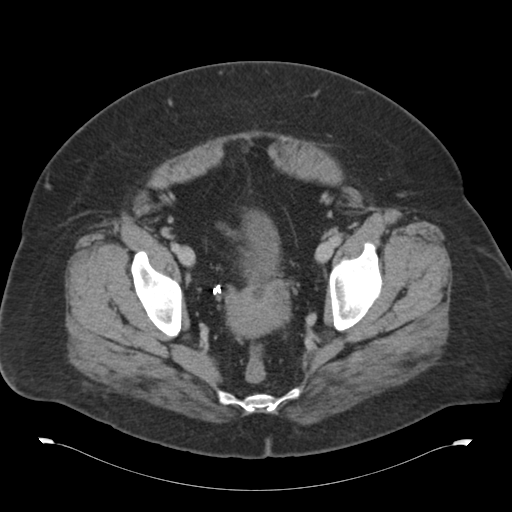
[im 22/96  soft-tissue]
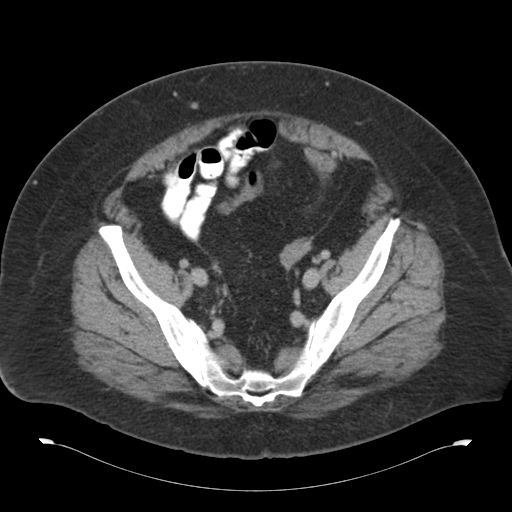
[im 27/96  soft-tissue]
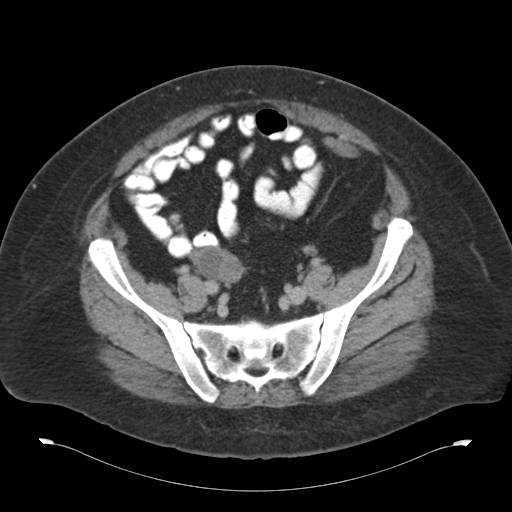
[im 37/96  soft-tissue]
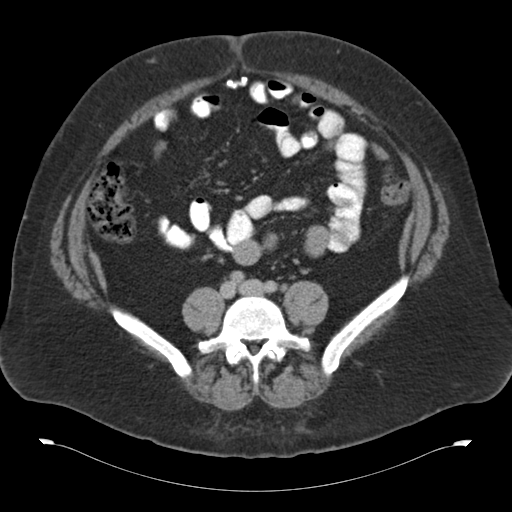
[im 43/96  soft-tissue]
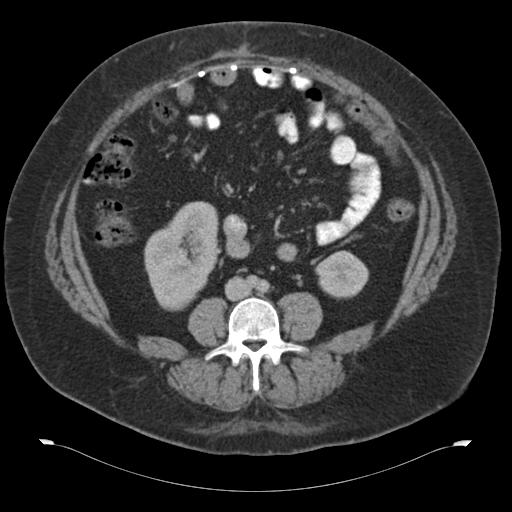
[im 53/96  soft-tissue]
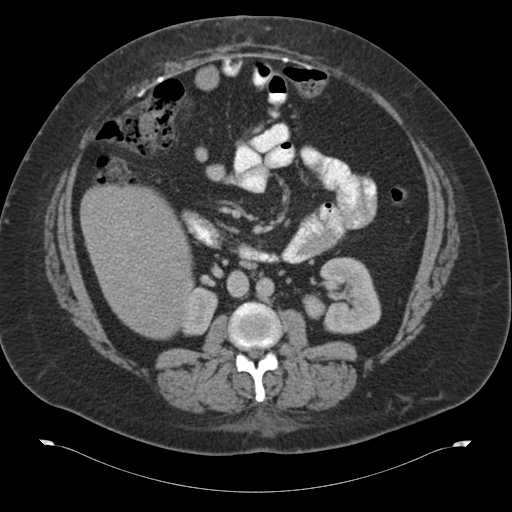
[im 59/96  soft-tissue]
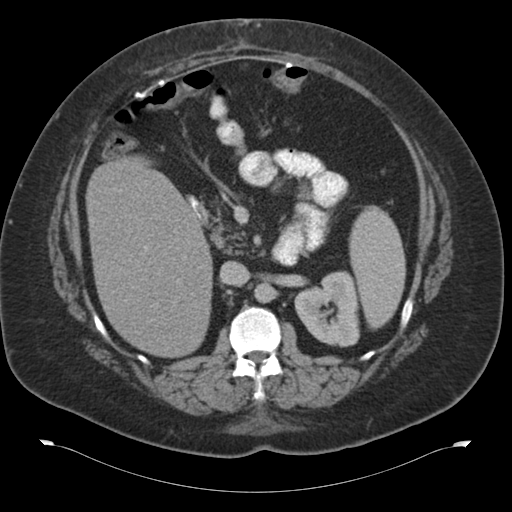
[im 69/96  soft-tissue]
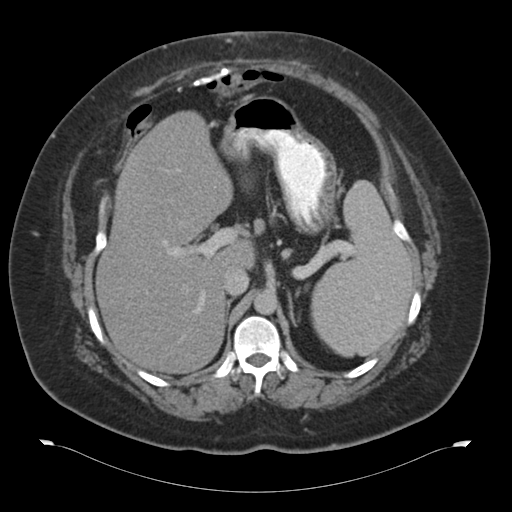
[im 69/96  bone]
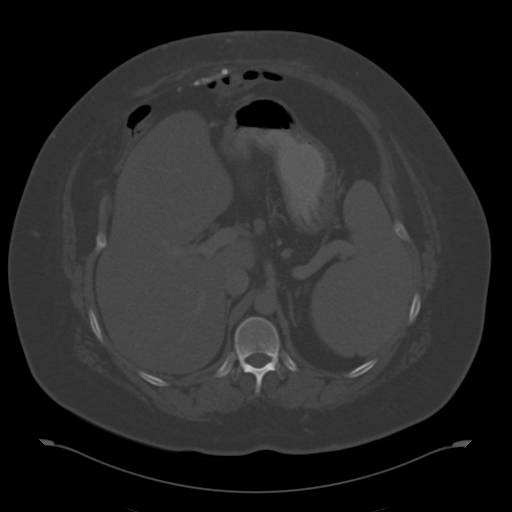
[im 74/96  soft-tissue]
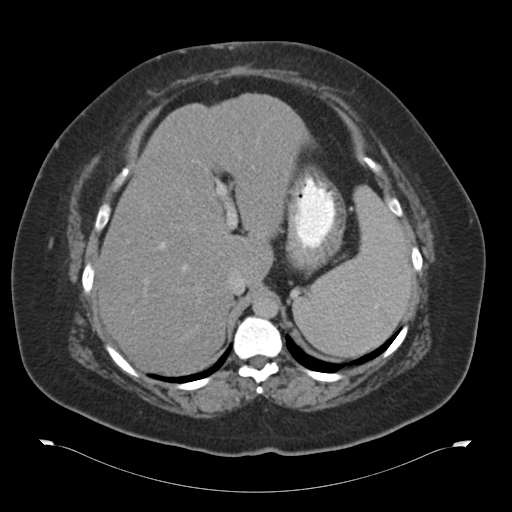
[im 74/96  lung]
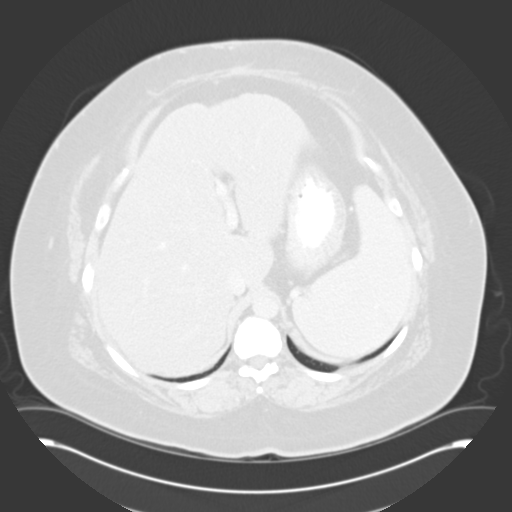
[im 80/96  soft-tissue]
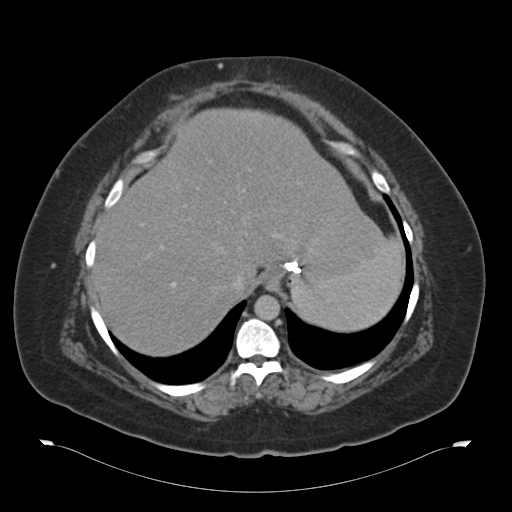
[im 80/96  lung]
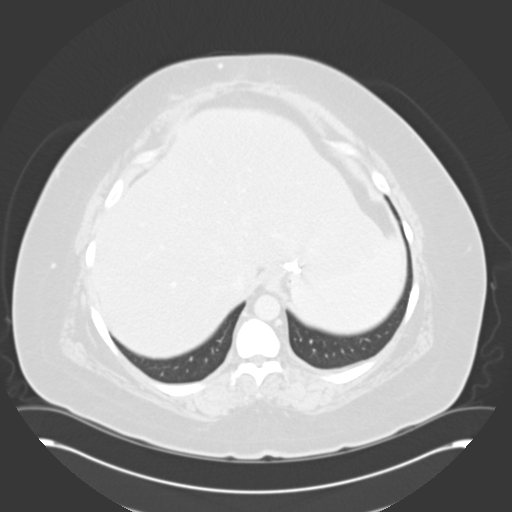
[im 85/96  lung]
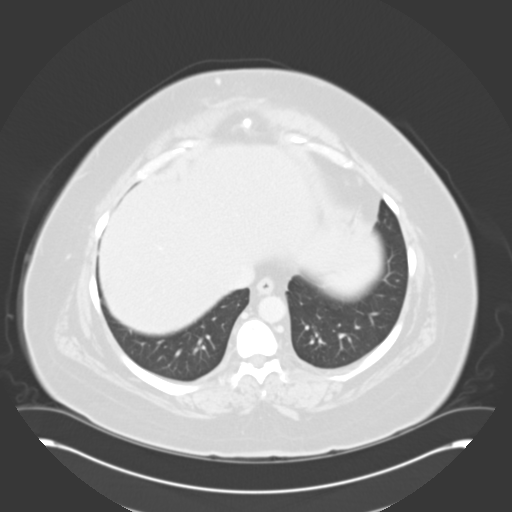
[im 90/96  soft-tissue]
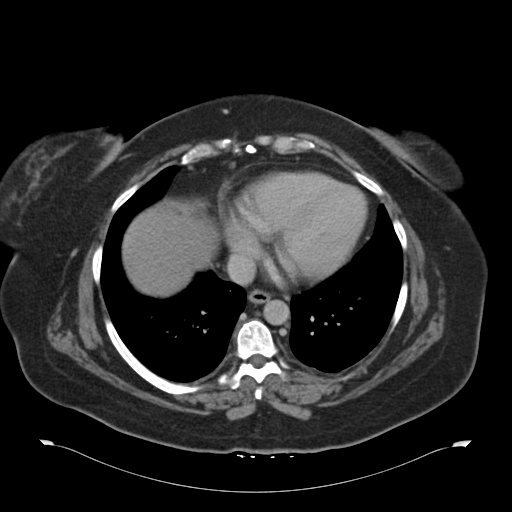
[im 90/96  lung]
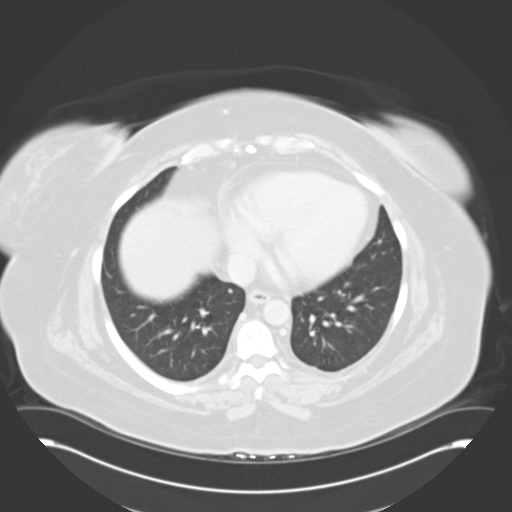

[13 of 32 positions shown; findings below may reference images not displayed]

FINDINGS: Again noted is diffuse fatty infiltration of the liver.  Laxity of the anterior abdominal wall is noted, and a hernia mesh is in place.  The spleen, adrenal glands, and kidneys appear unremarkable.  No pathologic retroperitoneal adenopathy.  The pancreas is moderately atrophic, but otherwise unremarkable.  No dilated upper abdominal bowel.  Mild colonic diverticulosis without evidence of active diverticulitis.
IMPRESSION: 1.Stable CT appearance of the upper abdomen.
 PELVIS CT WITH CONTRAST:
FINDINGS: Mass-like density at the vaginal cuff is consistent with the retained portion of the uterus as the patient only had a partial hysterectomy.
 There is a 3.4 x 1.5 cm left adnexal structure most consistent with left ovarian remnant.  The previously noted cyst in the structure has resolved.  
 A 5.1 x 2.8 cm right adnexal structure is consistent with the right ovary, and now has two internal cystic lesions, one within internal density of 30 Hounsfield units and the adjacent lesion with an internal density of 51 Hounsfield units.  These are most consistent with complex cyst of the right ovary.  Given that they have appeared over only a 5 to 6 week period, these are not considered suspicious for ovarian malignancy.  A right external iliac lymph node has transverse dimension of 1.2 cm in the short axis diameter and a similar node on the left has a transverse diameter of 1.1 cm.  These nodes may be reactive.  No pathologic inguinal adenopathy is identified.  There are some small pelvic side wall nodes including a 9 mm lymph node on the right on image 77 of series 2.  
 Clips from right-sided salpingectomy are noted.  The ureters and urinary bladder appear unremarkable.
IMPRESSION: 1.Complex cysts of the right ovary, which are new compared to the prior exam of 03/31/05.
 [DATE].Left ovarian remnant is present and measures 3.4 x 1.4 cm.
 3.Mass-like density at the vaginal cuff is consistent with the residual portion of the uterus. 
 4.No findings to explain the patient?s diarrheal process.
 5.Borderline external iliac nodes bilaterally, stable from the prior exam.

## 2006-03-26 ENCOUNTER — Emergency Department (HOSPITAL_COMMUNITY): Admission: EM | Admit: 2006-03-26 | Discharge: 2006-03-26 | Payer: Self-pay | Admitting: *Deleted

## 2006-03-28 ENCOUNTER — Ambulatory Visit: Payer: Self-pay | Admitting: Internal Medicine

## 2006-03-29 ENCOUNTER — Ambulatory Visit: Payer: Self-pay | Admitting: Critical Care Medicine

## 2006-04-20 ENCOUNTER — Ambulatory Visit: Payer: Self-pay | Admitting: Endocrinology

## 2006-04-25 ENCOUNTER — Ambulatory Visit: Payer: Self-pay | Admitting: Internal Medicine

## 2006-04-29 ENCOUNTER — Ambulatory Visit: Payer: Self-pay | Admitting: Internal Medicine

## 2006-05-04 ENCOUNTER — Ambulatory Visit: Payer: Self-pay | Admitting: Critical Care Medicine

## 2006-05-05 ENCOUNTER — Ambulatory Visit (HOSPITAL_COMMUNITY): Admission: RE | Admit: 2006-05-05 | Discharge: 2006-05-05 | Payer: Self-pay | Admitting: Cardiology

## 2006-05-11 ENCOUNTER — Ambulatory Visit: Payer: Self-pay | Admitting: Internal Medicine

## 2006-05-13 ENCOUNTER — Ambulatory Visit: Payer: Self-pay | Admitting: Internal Medicine

## 2006-05-17 ENCOUNTER — Ambulatory Visit: Payer: Self-pay | Admitting: Internal Medicine

## 2006-05-20 ENCOUNTER — Ambulatory Visit: Payer: Self-pay | Admitting: Endocrinology

## 2006-06-03 ENCOUNTER — Ambulatory Visit: Payer: Self-pay | Admitting: Endocrinology

## 2006-06-06 ENCOUNTER — Ambulatory Visit: Payer: Self-pay | Admitting: Cardiology

## 2006-06-07 ENCOUNTER — Ambulatory Visit: Payer: Self-pay | Admitting: Internal Medicine

## 2006-06-08 ENCOUNTER — Ambulatory Visit: Payer: Self-pay | Admitting: Gastroenterology

## 2006-06-16 ENCOUNTER — Encounter: Payer: Self-pay | Admitting: Gastroenterology

## 2006-06-16 ENCOUNTER — Encounter (INDEPENDENT_AMBULATORY_CARE_PROVIDER_SITE_OTHER): Payer: Self-pay | Admitting: Specialist

## 2006-06-16 ENCOUNTER — Ambulatory Visit (HOSPITAL_COMMUNITY): Admission: RE | Admit: 2006-06-16 | Discharge: 2006-06-16 | Payer: Self-pay | Admitting: Gastroenterology

## 2006-06-20 ENCOUNTER — Ambulatory Visit: Payer: Self-pay | Admitting: Gastroenterology

## 2006-06-28 ENCOUNTER — Ambulatory Visit: Payer: Self-pay | Admitting: Internal Medicine

## 2006-07-04 ENCOUNTER — Ambulatory Visit: Payer: Self-pay | Admitting: Internal Medicine

## 2006-07-06 ENCOUNTER — Ambulatory Visit: Payer: Self-pay | Admitting: Endocrinology

## 2006-07-07 ENCOUNTER — Ambulatory Visit: Payer: Self-pay | Admitting: Internal Medicine

## 2006-07-07 ENCOUNTER — Ambulatory Visit: Payer: Self-pay | Admitting: Critical Care Medicine

## 2006-07-15 ENCOUNTER — Ambulatory Visit: Payer: Self-pay | Admitting: Internal Medicine

## 2006-07-27 ENCOUNTER — Ambulatory Visit: Payer: Self-pay | Admitting: Endocrinology

## 2006-07-28 ENCOUNTER — Ambulatory Visit: Payer: Self-pay | Admitting: Cardiology

## 2006-08-03 ENCOUNTER — Emergency Department (HOSPITAL_COMMUNITY): Admission: EM | Admit: 2006-08-03 | Discharge: 2006-08-03 | Payer: Self-pay | Admitting: Emergency Medicine

## 2006-08-11 ENCOUNTER — Ambulatory Visit: Payer: Self-pay | Admitting: Critical Care Medicine

## 2006-08-21 DIAGNOSIS — I10 Essential (primary) hypertension: Secondary | ICD-10-CM | POA: Insufficient documentation

## 2006-08-21 DIAGNOSIS — E785 Hyperlipidemia, unspecified: Secondary | ICD-10-CM

## 2006-08-21 DIAGNOSIS — E1165 Type 2 diabetes mellitus with hyperglycemia: Secondary | ICD-10-CM | POA: Insufficient documentation

## 2006-08-21 DIAGNOSIS — E119 Type 2 diabetes mellitus without complications: Secondary | ICD-10-CM | POA: Insufficient documentation

## 2006-08-21 DIAGNOSIS — E1169 Type 2 diabetes mellitus with other specified complication: Secondary | ICD-10-CM | POA: Insufficient documentation

## 2006-08-21 DIAGNOSIS — K219 Gastro-esophageal reflux disease without esophagitis: Secondary | ICD-10-CM | POA: Insufficient documentation

## 2006-08-21 DIAGNOSIS — E1129 Type 2 diabetes mellitus with other diabetic kidney complication: Secondary | ICD-10-CM | POA: Insufficient documentation

## 2006-08-21 DIAGNOSIS — IMO0002 Reserved for concepts with insufficient information to code with codable children: Secondary | ICD-10-CM | POA: Insufficient documentation

## 2006-08-21 DIAGNOSIS — I152 Hypertension secondary to endocrine disorders: Secondary | ICD-10-CM | POA: Insufficient documentation

## 2006-08-21 DIAGNOSIS — E1159 Type 2 diabetes mellitus with other circulatory complications: Secondary | ICD-10-CM

## 2006-08-21 HISTORY — DX: Gastro-esophageal reflux disease without esophagitis: K21.9

## 2006-08-21 HISTORY — DX: Type 2 diabetes mellitus without complications: E11.9

## 2006-08-21 HISTORY — DX: Hyperlipidemia, unspecified: E78.5

## 2006-08-21 HISTORY — DX: Essential (primary) hypertension: I10

## 2006-08-22 ENCOUNTER — Ambulatory Visit: Payer: Self-pay | Admitting: Internal Medicine

## 2006-08-22 LAB — CONVERTED CEMR LAB
ALT: 49 units/L — ABNORMAL HIGH (ref 0–40)
AST: 42 units/L — ABNORMAL HIGH (ref 0–37)
Albumin: 3.7 g/dL (ref 3.5–5.2)
Alkaline Phosphatase: 167 units/L — ABNORMAL HIGH (ref 39–117)
BUN: 9 mg/dL (ref 6–23)
Bilirubin, Direct: 0.4 mg/dL — ABNORMAL HIGH (ref 0.0–0.3)
CO2: 33 meq/L — ABNORMAL HIGH (ref 19–32)
Calcium: 9.6 mg/dL (ref 8.4–10.5)
Chloride: 97 meq/L (ref 96–112)
Chol/HDL Ratio, serum: 7.1
Cholesterol: 228 mg/dL (ref 0–200)
Creatinine, Ser: 0.8 mg/dL (ref 0.4–1.2)
GFR calc non Af Amer: 82 mL/min
Glomerular Filtration Rate, Af Am: 99 mL/min/{1.73_m2}
Glucose, Bld: 143 mg/dL — ABNORMAL HIGH (ref 70–99)
HDL: 32 mg/dL — ABNORMAL LOW (ref >39.0)
Hgb A1c MFr Bld: 5.2 % (ref 4.6–6.0)
LDL DIRECT: 160 mg/dL
Potassium: 3.9 meq/L (ref 3.5–5.1)
Sodium: 139 meq/L (ref 135–145)
Total Bilirubin: 1.6 mg/dL — ABNORMAL HIGH (ref 0.3–1.2)
Total Protein: 7.7 g/dL (ref 6.0–8.3)
Triglyceride fasting, serum: 225 mg/dL (ref 0–149)
VLDL: 45 mg/dL — ABNORMAL HIGH (ref 0–40)

## 2006-09-08 ENCOUNTER — Ambulatory Visit: Payer: Self-pay | Admitting: Critical Care Medicine

## 2006-09-09 ENCOUNTER — Ambulatory Visit: Payer: Self-pay | Admitting: Cardiology

## 2006-09-09 ENCOUNTER — Encounter: Payer: Self-pay | Admitting: Critical Care Medicine

## 2006-09-14 ENCOUNTER — Ambulatory Visit: Payer: Self-pay | Admitting: Internal Medicine

## 2006-09-14 LAB — CONVERTED CEMR LAB
ALT: 21 units/L (ref 0–40)
AST: 21 units/L (ref 0–37)
Albumin: 4 g/dL (ref 3.5–5.2)
Alkaline Phosphatase: 167 units/L — ABNORMAL HIGH (ref 39–117)
BUN: 8 mg/dL (ref 6–23)
Bilirubin, Direct: 0.4 mg/dL — ABNORMAL HIGH (ref 0.0–0.3)
CO2: 31 meq/L (ref 19–32)
Calcium: 9.5 mg/dL (ref 8.4–10.5)
Chloride: 96 meq/L (ref 96–112)
Chol/HDL Ratio, serum: 4.4
Cholesterol: 148 mg/dL (ref 0–200)
Creatinine, Ser: 0.9 mg/dL (ref 0.4–1.2)
GFR calc non Af Amer: 71 mL/min
Glomerular Filtration Rate, Af Am: 86 mL/min/{1.73_m2}
Glucose, Bld: 150 mg/dL — ABNORMAL HIGH (ref 70–99)
HDL: 33.7 mg/dL — ABNORMAL LOW (ref >39.0)
Hgb A1c MFr Bld: 5.3 % (ref 4.6–6.0)
LDL Cholesterol: 81 mg/dL (ref 0–99)
Potassium: 3.9 meq/L (ref 3.5–5.1)
Sodium: 138 meq/L (ref 135–145)
Total Bilirubin: 1.4 mg/dL — ABNORMAL HIGH (ref 0.3–1.2)
Total Protein: 6.9 g/dL (ref 6.0–8.3)
Triglyceride fasting, serum: 168 mg/dL — ABNORMAL HIGH (ref 0–149)
VLDL: 34 mg/dL (ref 0–40)

## 2006-09-21 ENCOUNTER — Encounter: Payer: Self-pay | Admitting: Internal Medicine

## 2006-09-21 ENCOUNTER — Ambulatory Visit: Payer: Self-pay | Admitting: Internal Medicine

## 2006-09-22 IMAGING — CT CT ANGIO CHEST
2 of 5 series · 19 of 46 positions shown · IV contrast (APPLIED)
Comparison: none

HISTORY: Dyspnea, substernal chest pain, elevated d-dimer

[Series 5: pe_xxl 1.0 b20f st · axial · 0.71mm/px · z∈[-234,-14]mm · 16 of 248 slices shown]
[im 14/248  lung]
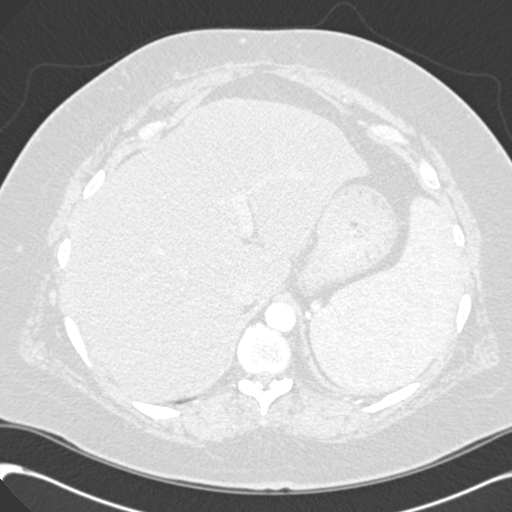
[im 28/248  soft-tissue]
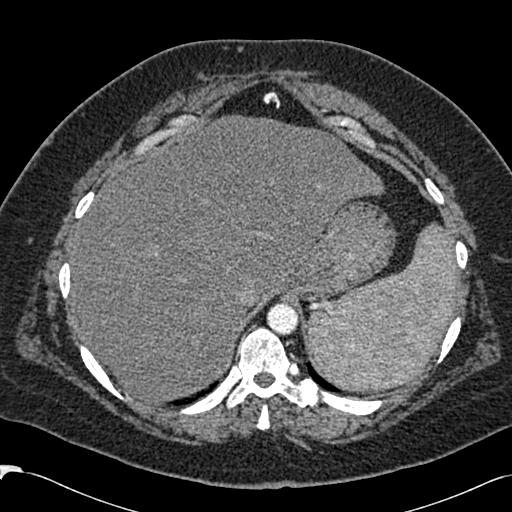
[im 42/248  lung]
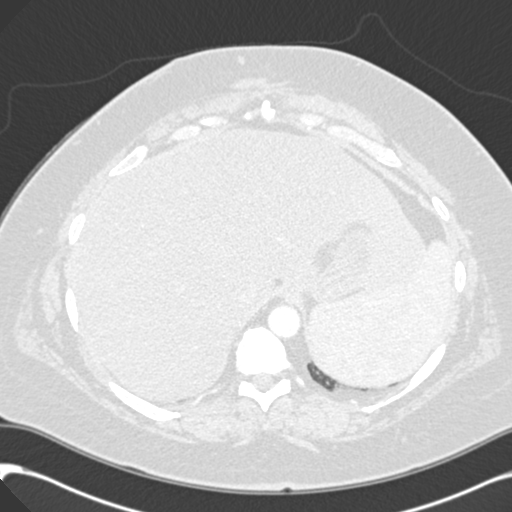
[im 55/248  soft-tissue]
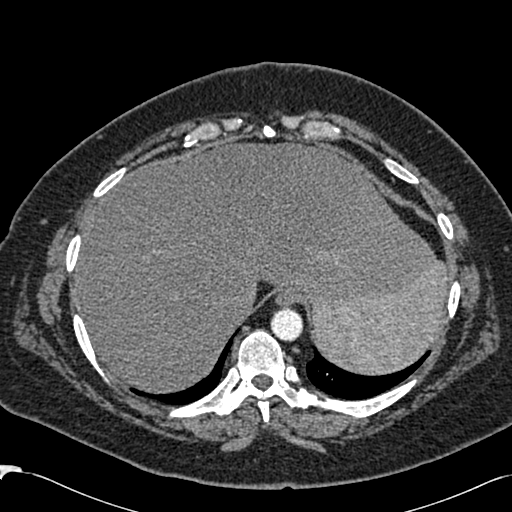
[im 69/248  lung]
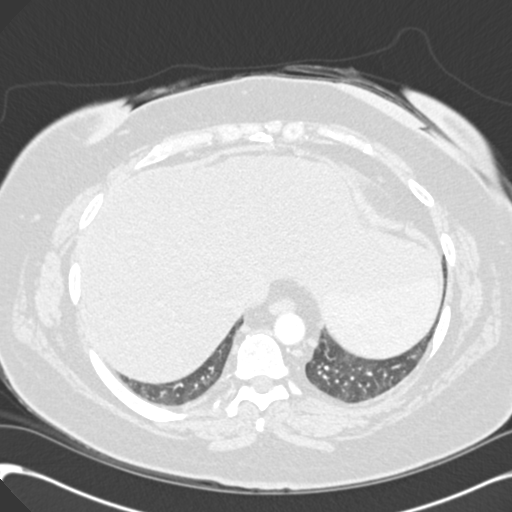
[im 83/248  soft-tissue]
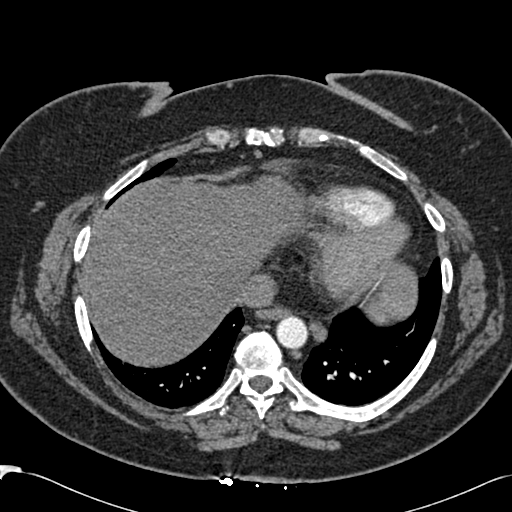
[im 97/248  lung]
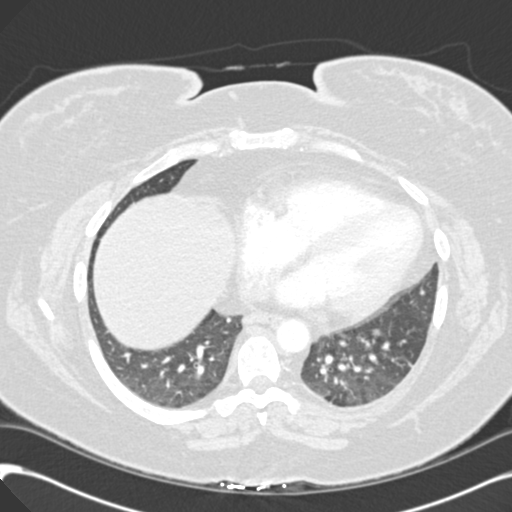
[im 110/248  soft-tissue]
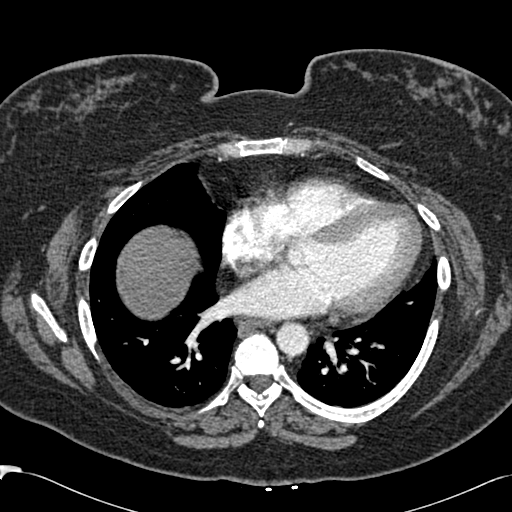
[im 138/248  lung]
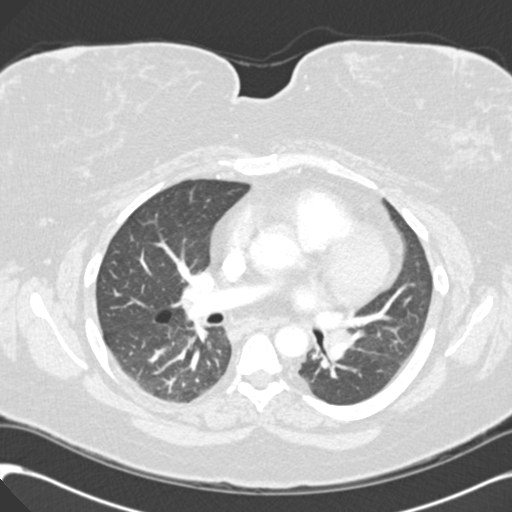
[im 151/248  soft-tissue]
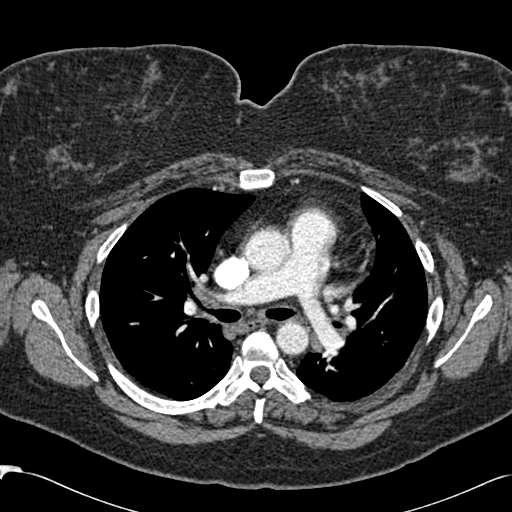
[im 165/248  lung]
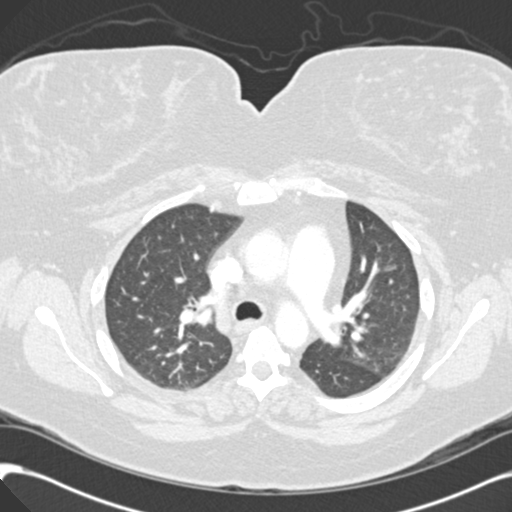
[im 179/248  soft-tissue]
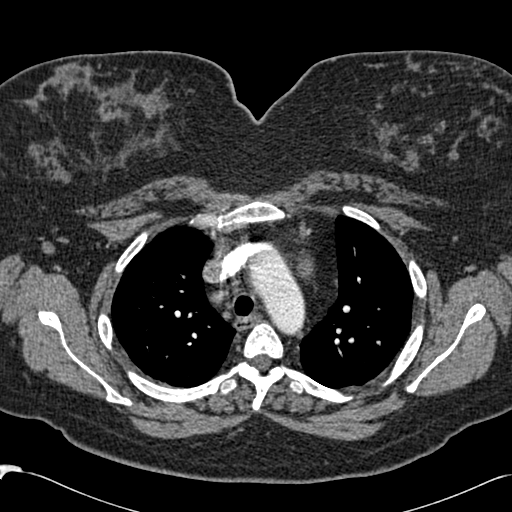
[im 193/248  lung]
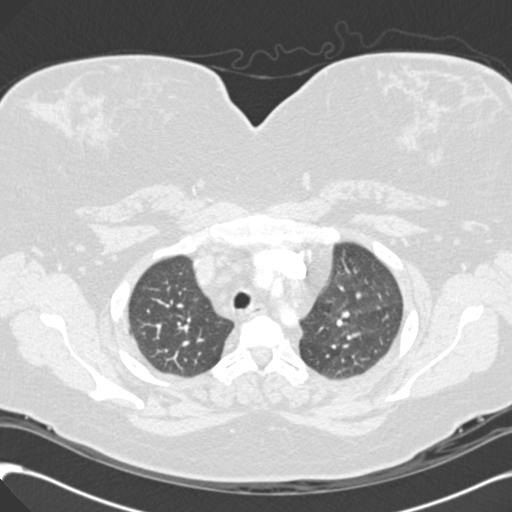
[im 206/248  soft-tissue]
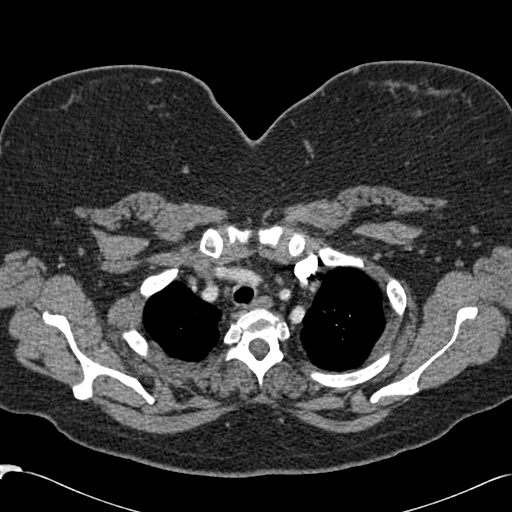
[im 220/248  lung]
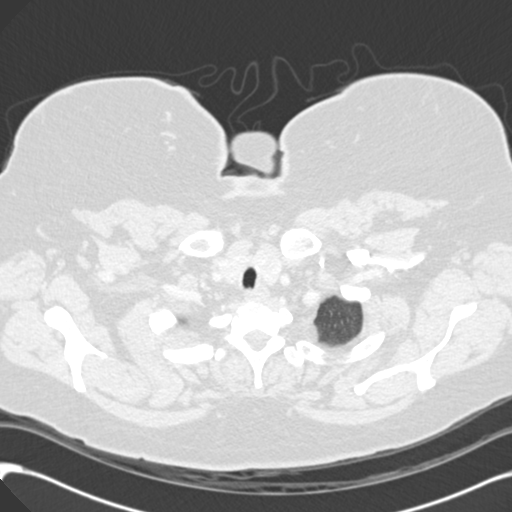
[im 234/248  soft-tissue]
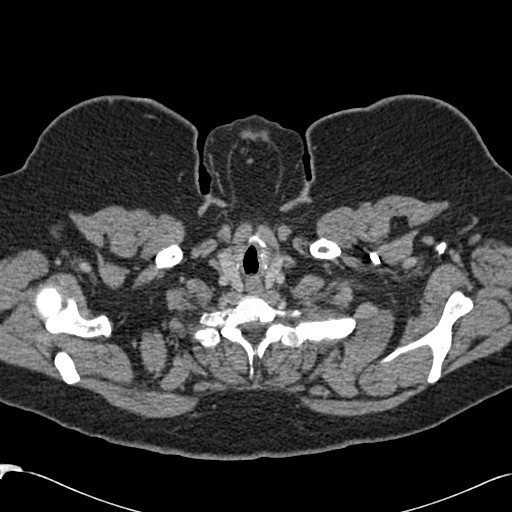

[Series 602: <mpr range> · coronal · 0.71mm/px · 3 of 39 slices shown]
[im 13/39  soft-tissue]
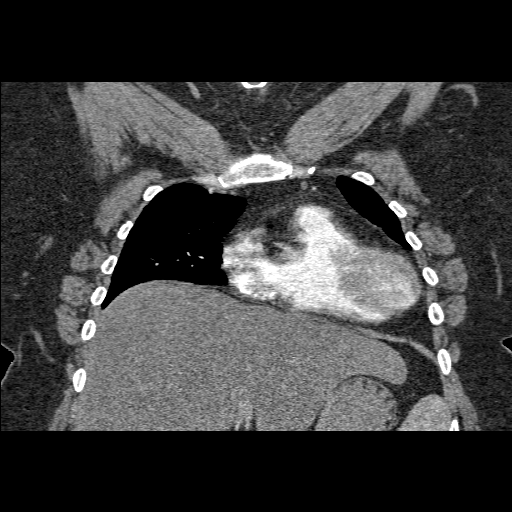
[im 17/39  soft-tissue]
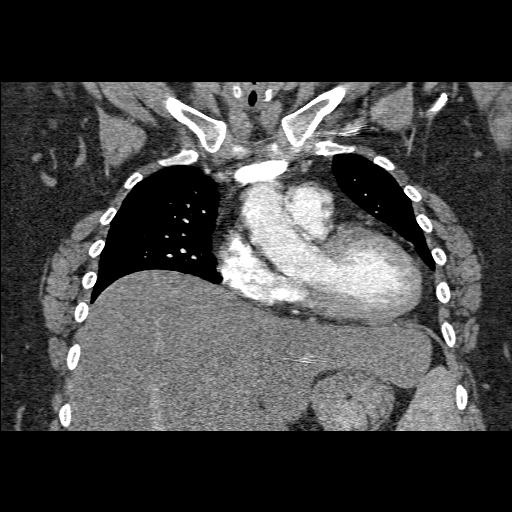
[im 22/39  soft-tissue]
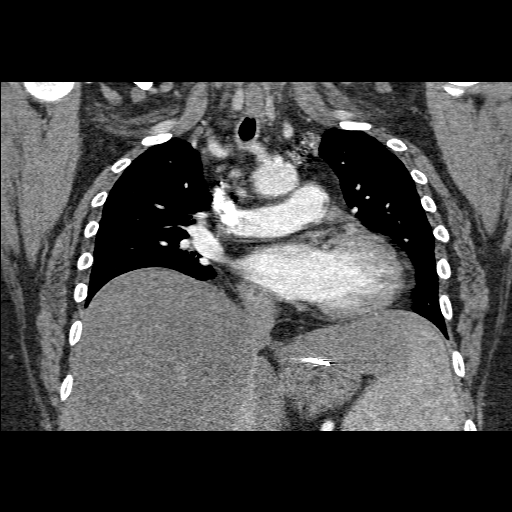

[19 of 46 positions shown; findings below may reference images not displayed]

CT ANGIO CHEST:

Multidetector helical CT imaging chest performed using pulmonary embolism
protocol following 80 cc 6mnipaque-VII. Additional coronal and sagittal images
are reconstructed from the axial data set to assist in evaluation of the
thoracic vasculature.
Prior examinations currently unavailable for comparison.

Aorta normal caliber without aneurysm or dissection.
Pulmonary arteries patent.
No evidence of pulmonary embolism.
Scattered normal and minimally enlarged lymph nodes identified within
mediastinum and hila.
AP window lymph node 11 x 15 mm image 25.
Inferior left mediastinal lymph node adjacent to descending thoracic aorta, 17 x
11 mm image 55.
No axillary adenopathy.
Spleen appears enlarged, though incompletely assessed, extending beyond exam.
Nonspecific tiny right upper lobe nodule, 4 mm diameter image 31.
No other pulmonary nodule, mass, or pleural effusion.
Minimal groundglass opacity in lower lobes dependently.
No acute bone abnormalities.
IMPRESSION: No evidence of pulmonary embolism or aortic dissection.
Tiny nonspecific pulmonary nodule right upper lobe, recommend followup in 12
months to assess stability.
Minimal ground glass opacity in lower lobes can be seen with mild edema or
infection.
Nonspecific mediastinal and hilar lymph node prominence.
Question splenomegaly

## 2006-09-23 ENCOUNTER — Ambulatory Visit: Payer: Self-pay | Admitting: Internal Medicine

## 2006-10-19 ENCOUNTER — Ambulatory Visit: Payer: Self-pay | Admitting: Internal Medicine

## 2006-10-20 ENCOUNTER — Ambulatory Visit: Payer: Self-pay | Admitting: Critical Care Medicine

## 2006-10-20 LAB — CONVERTED CEMR LAB
ALT: 18 units/L (ref 0–40)
AST: 22 units/L (ref 0–37)
Albumin: 3.6 g/dL (ref 3.5–5.2)
Alkaline Phosphatase: 142 units/L — ABNORMAL HIGH (ref 39–117)
Angiotensin 1 Converting Enzyme: 52 units/L (ref 9–67)
Bilirubin, Direct: 0.3 mg/dL (ref 0.0–0.3)
Calcium: 9.5 mg/dL (ref 8.4–10.5)
Total Bilirubin: 1 mg/dL (ref 0.3–1.2)
Total Protein: 7.5 g/dL (ref 6.0–8.3)

## 2006-10-24 ENCOUNTER — Emergency Department (HOSPITAL_COMMUNITY): Admission: EM | Admit: 2006-10-24 | Discharge: 2006-10-24 | Payer: Self-pay | Admitting: Emergency Medicine

## 2006-10-28 ENCOUNTER — Ambulatory Visit: Payer: Self-pay | Admitting: Internal Medicine

## 2006-11-07 ENCOUNTER — Encounter: Payer: Self-pay | Admitting: Gastroenterology

## 2006-11-07 ENCOUNTER — Ambulatory Visit (HOSPITAL_COMMUNITY): Admission: RE | Admit: 2006-11-07 | Discharge: 2006-11-07 | Payer: Self-pay | Admitting: Thoracic Surgery

## 2006-11-07 ENCOUNTER — Encounter (INDEPENDENT_AMBULATORY_CARE_PROVIDER_SITE_OTHER): Payer: Self-pay | Admitting: Specialist

## 2006-11-22 ENCOUNTER — Ambulatory Visit: Payer: Self-pay | Admitting: Internal Medicine

## 2006-11-23 ENCOUNTER — Ambulatory Visit: Payer: Self-pay | Admitting: Critical Care Medicine

## 2006-11-23 ENCOUNTER — Encounter: Payer: Self-pay | Admitting: Internal Medicine

## 2006-11-29 ENCOUNTER — Ambulatory Visit: Payer: Self-pay | Admitting: Internal Medicine

## 2006-11-30 ENCOUNTER — Ambulatory Visit: Payer: Self-pay | Admitting: Thoracic Surgery

## 2006-12-14 ENCOUNTER — Ambulatory Visit: Payer: Self-pay | Admitting: Internal Medicine

## 2006-12-27 ENCOUNTER — Ambulatory Visit: Payer: Self-pay | Admitting: Critical Care Medicine

## 2006-12-28 ENCOUNTER — Ambulatory Visit: Payer: Self-pay | Admitting: Internal Medicine

## 2006-12-28 LAB — CONVERTED CEMR LAB
ALT: 52 units/L — ABNORMAL HIGH (ref 0–40)
AST: 42 units/L — ABNORMAL HIGH (ref 0–37)
Albumin: 3.6 g/dL (ref 3.5–5.2)
Alkaline Phosphatase: 130 units/L — ABNORMAL HIGH (ref 39–117)
BUN: 11 mg/dL (ref 6–23)
Basophils Absolute: 0 10*3/uL (ref 0.0–0.1)
Basophils Relative: 0.2 % (ref 0.0–1.0)
Bilirubin, Direct: 0.1 mg/dL (ref 0.0–0.3)
CO2: 34 meq/L — ABNORMAL HIGH (ref 19–32)
Calcium: 9.5 mg/dL (ref 8.4–10.5)
Chloride: 100 meq/L (ref 96–112)
Cholesterol: 206 mg/dL (ref 0–200)
Creatinine, Ser: 0.8 mg/dL (ref 0.4–1.2)
Direct LDL: 125.8 mg/dL
Eosinophils Absolute: 0.3 10*3/uL (ref 0.0–0.6)
Eosinophils Relative: 1.9 % (ref 0.0–5.0)
GFR calc Af Amer: 99 mL/min
GFR calc non Af Amer: 82 mL/min
Glucose, Bld: 139 mg/dL — ABNORMAL HIGH (ref 70–99)
HCT: 39 % (ref 36.0–46.0)
HDL: 45.2 mg/dL (ref >39.0)
Hemoglobin: 13.4 g/dL (ref 12.0–15.0)
Hgb A1c MFr Bld: 6.5 % — ABNORMAL HIGH (ref 4.6–6.0)
Lymphocytes Relative: 24.8 % (ref 12.0–46.0)
MCHC: 34.3 g/dL (ref 30.0–36.0)
MCV: 87.3 fL (ref 78.0–100.0)
Monocytes Absolute: 1 10*3/uL — ABNORMAL HIGH (ref 0.2–0.7)
Monocytes Relative: 6.2 % (ref 3.0–11.0)
Neutro Abs: 11.4 10*3/uL — ABNORMAL HIGH (ref 1.4–7.7)
Neutrophils Relative %: 66.9 % (ref 43.0–77.0)
Platelets: 439 10*3/uL — ABNORMAL HIGH (ref 150–400)
Potassium: 3.5 meq/L (ref 3.5–5.1)
RBC: 4.46 M/uL (ref 3.87–5.11)
RDW: 13.7 % (ref 11.5–14.6)
Sodium: 143 meq/L (ref 135–145)
Total Bilirubin: 0.9 mg/dL (ref 0.3–1.2)
Total CHOL/HDL Ratio: 4.6
Total Protein: 6.9 g/dL (ref 6.0–8.3)
Triglycerides: 281 mg/dL (ref 0–149)
VLDL: 56 mg/dL — ABNORMAL HIGH (ref 0–40)
WBC: 16.9 10*3/uL — ABNORMAL HIGH (ref 4.5–10.5)

## 2007-01-09 ENCOUNTER — Ambulatory Visit: Payer: Self-pay | Admitting: Internal Medicine

## 2007-01-17 ENCOUNTER — Ambulatory Visit: Payer: Self-pay | Admitting: Critical Care Medicine

## 2007-02-03 IMAGING — CR DG CHEST 1V PORT
1 series · 1 of 1 positions shown · non-contrast
Comparison: 10/25/05.

CLINICAL DATA: Shortness of breath, coughing.  
 PORTABLE CHEST - 1 VIEW:

[view not recorded]
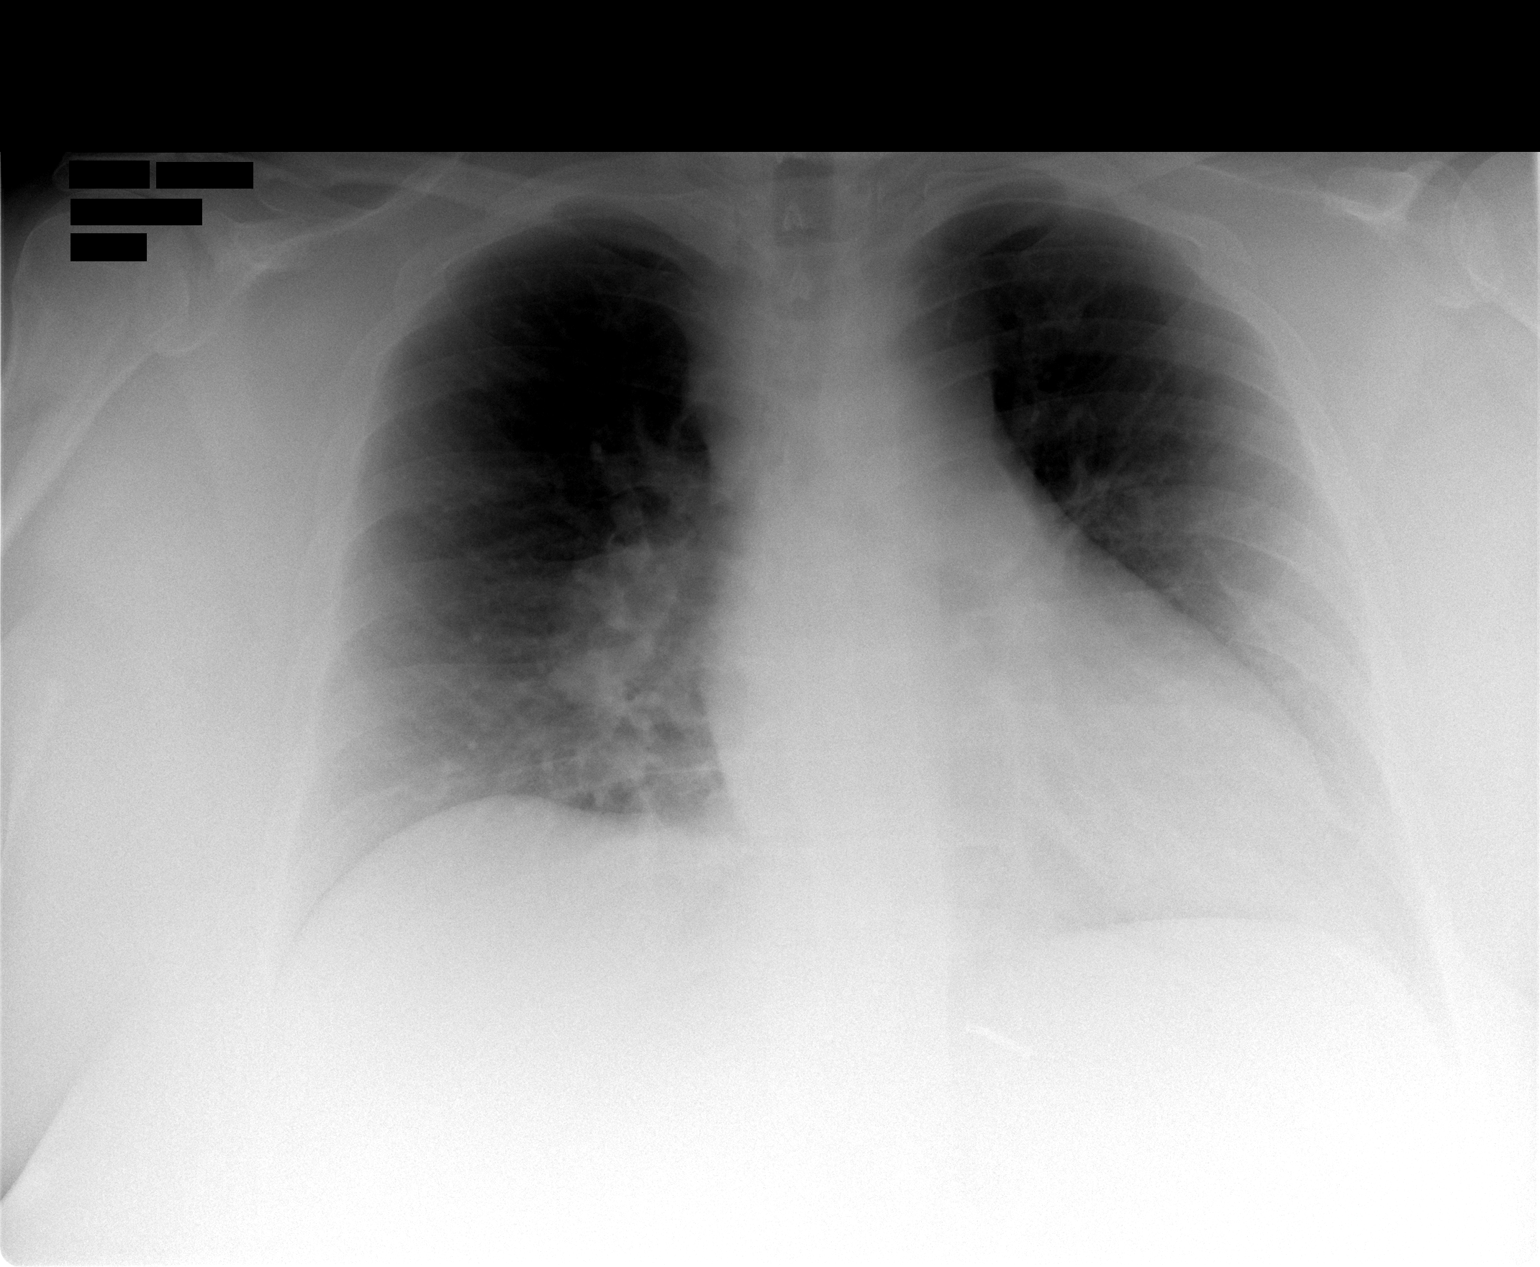

[1 of 1 positions shown; findings below may reference images not displayed]

FINDINGS: Heart size is at the upper limits of normal.  Clips are noted over the epigastrium.  The lung volumes are low with crowding of the bronchovascular markings at the lung bases.  No effusion.
IMPRESSION: Low lung volumes with crowding of the bronchovascular markings.

## 2007-02-13 ENCOUNTER — Ambulatory Visit: Payer: Self-pay | Admitting: Internal Medicine

## 2007-02-14 ENCOUNTER — Ambulatory Visit: Payer: Self-pay | Admitting: Critical Care Medicine

## 2007-02-21 ENCOUNTER — Ambulatory Visit: Payer: Self-pay | Admitting: Internal Medicine

## 2007-02-28 ENCOUNTER — Ambulatory Visit: Payer: Self-pay | Admitting: Internal Medicine

## 2007-02-28 LAB — CONVERTED CEMR LAB
BUN: 14 mg/dL (ref 6–23)
CO2: 30 meq/L (ref 19–32)
Calcium: 9.3 mg/dL (ref 8.4–10.5)
Chloride: 99 meq/L (ref 96–112)
Creatinine, Ser: 0.7 mg/dL (ref 0.4–1.2)
GFR calc Af Amer: 115 mL/min
GFR calc non Af Amer: 95 mL/min
Glucose, Bld: 123 mg/dL — ABNORMAL HIGH (ref 70–99)
Potassium: 3.3 meq/L — ABNORMAL LOW (ref 3.5–5.1)
Sodium: 139 meq/L (ref 135–145)

## 2007-03-03 ENCOUNTER — Ambulatory Visit: Payer: Self-pay | Admitting: Internal Medicine

## 2007-03-10 ENCOUNTER — Ambulatory Visit: Payer: Self-pay | Admitting: Internal Medicine

## 2007-03-15 IMAGING — CT CT ANGIO CHEST
2 of 3 series · 19 of 36 positions shown · non-contrast
Comparison: none

CLINICAL DATA: Chest pain, diabetes

[Series 4: pulm embolism 2.0 b31f st · axial · 0.71mm/px · z∈[-314,-32]mm · 16 of 153 slices shown]
[im 6/153  lung]
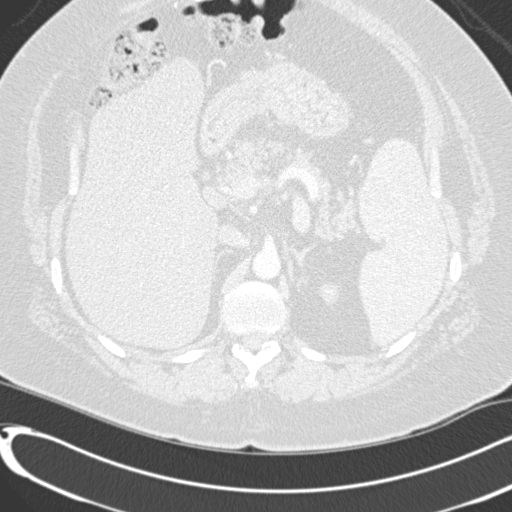
[im 17/153  mediastinal]
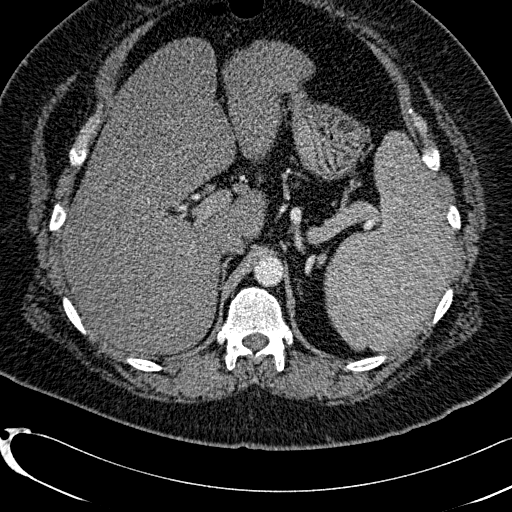
[im 29/153  lung]
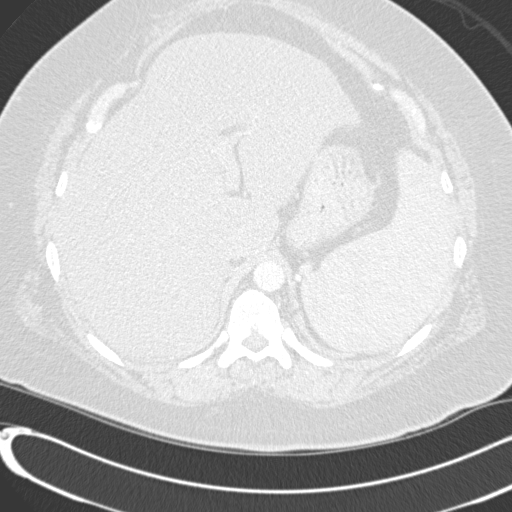
[im 34/153  mediastinal]
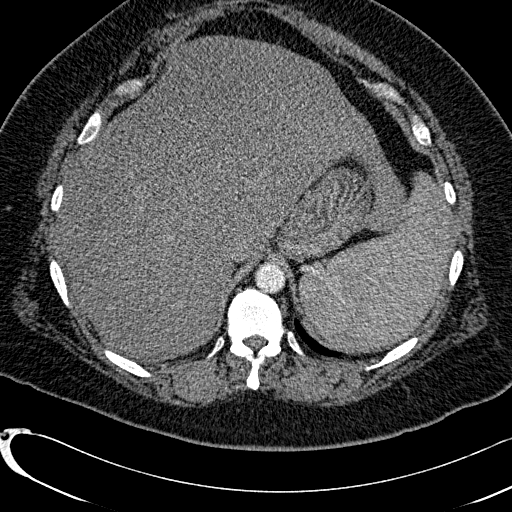
[im 46/153  lung]
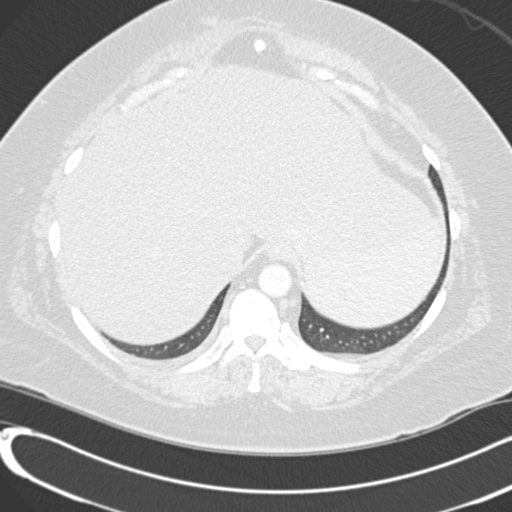
[im 51/153  mediastinal]
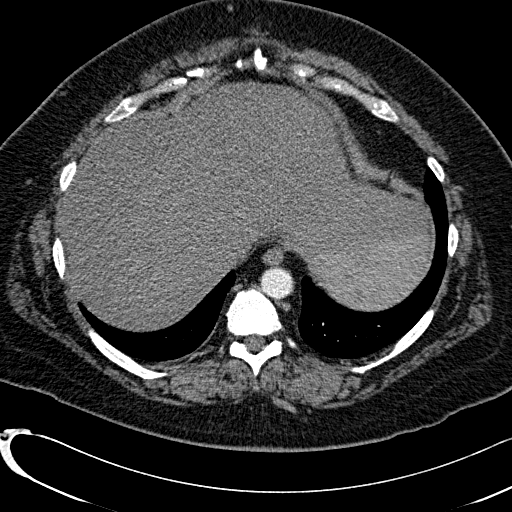
[im 62/153  lung]
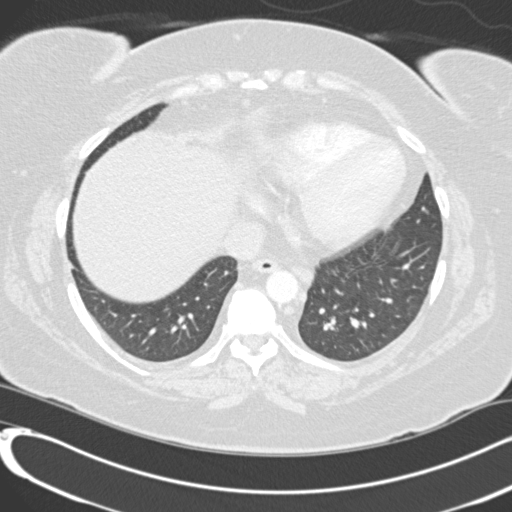
[im 74/153  mediastinal]
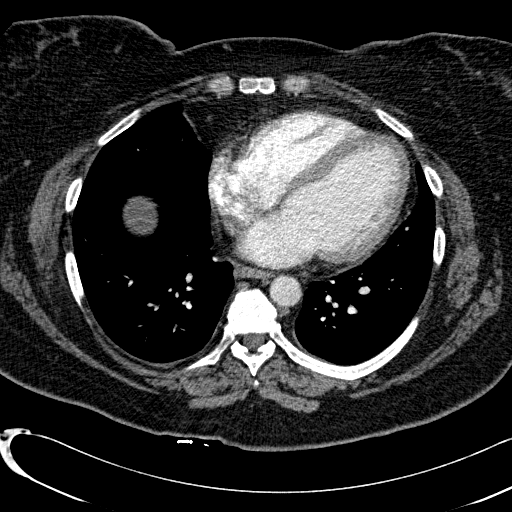
[im 79/153  lung]
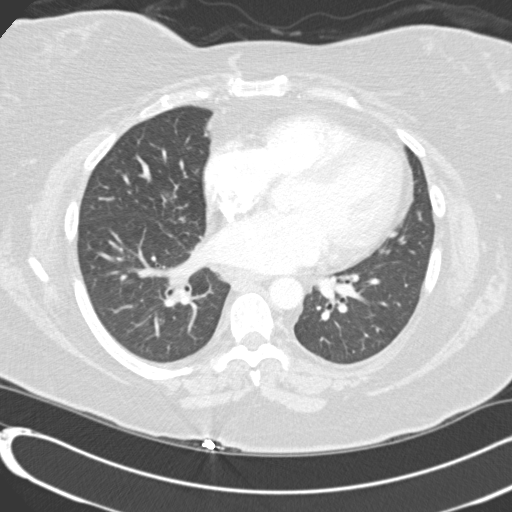
[im 91/153  mediastinal]
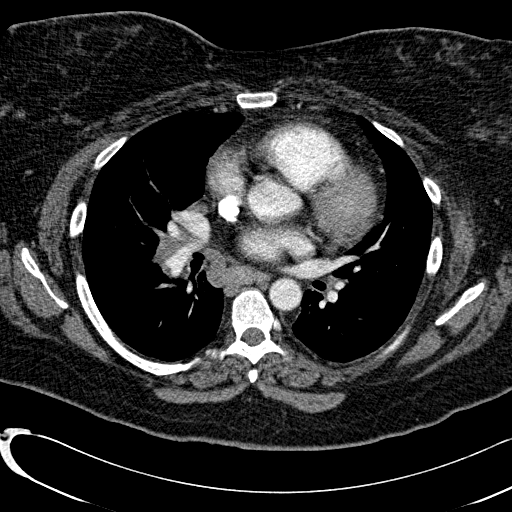
[im 102/153  lung]
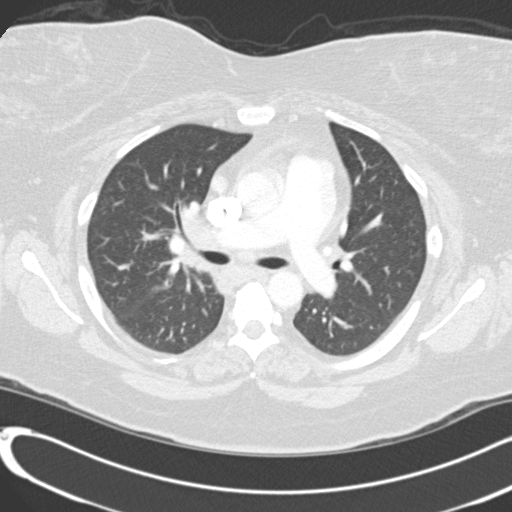
[im 107/153  mediastinal]
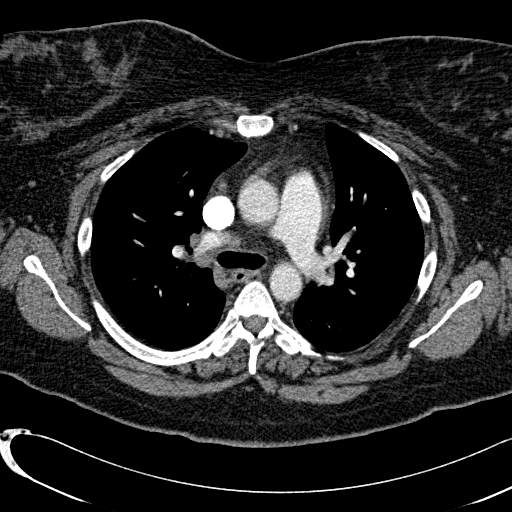
[im 119/153  lung]
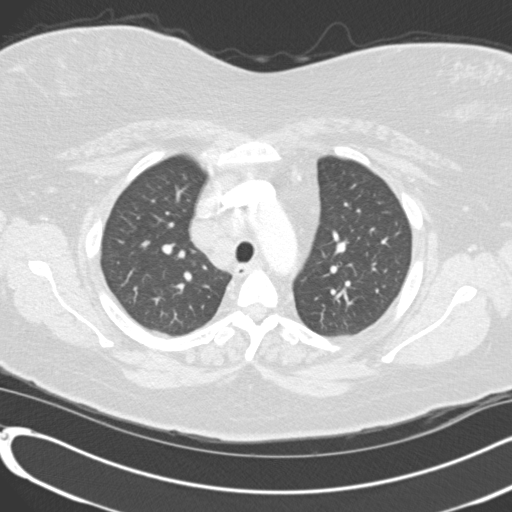
[im 124/153  mediastinal]
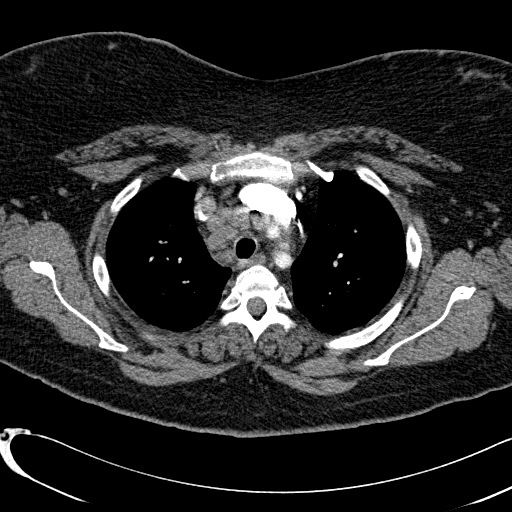
[im 136/153  lung]
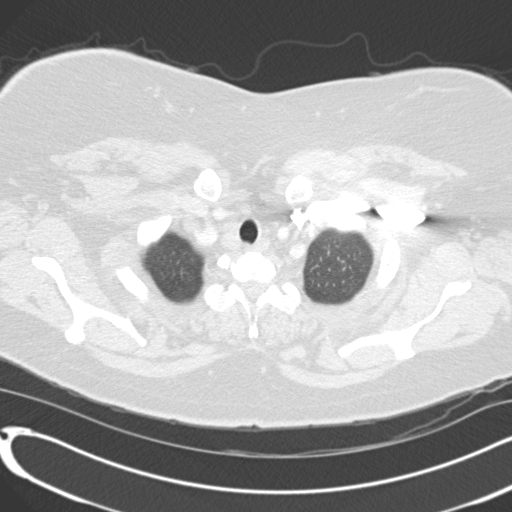
[im 147/153  mediastinal]
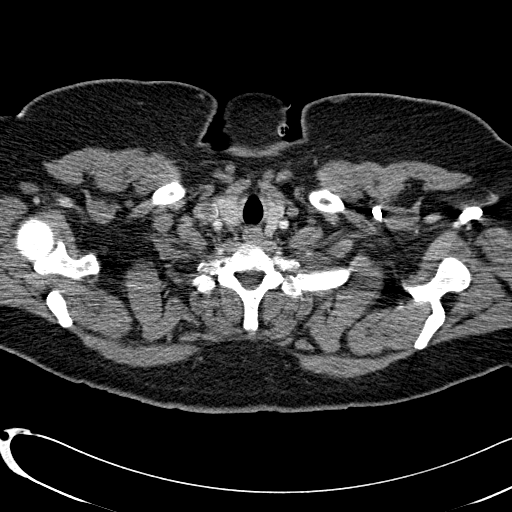

[Series 603: coronal pe · coronal · 0.71mm/px · 3 of 300 slices shown]
[im 60/300  mediastinal]
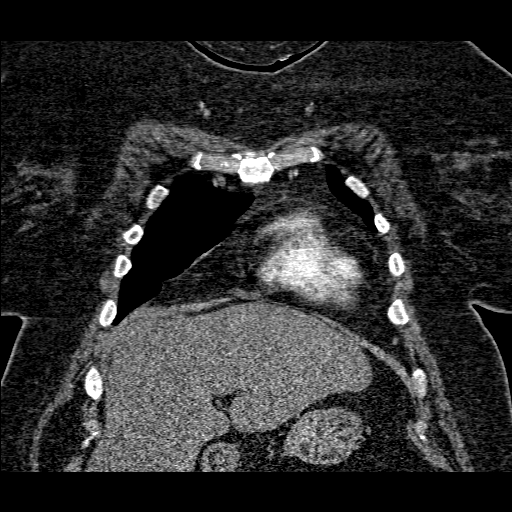
[im 120/300  mediastinal]
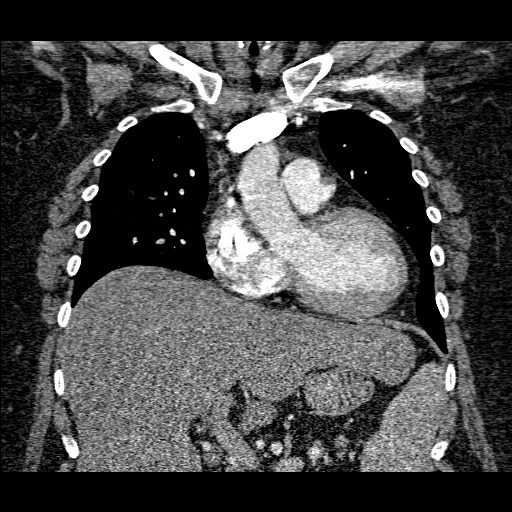
[im 180/300  mediastinal]
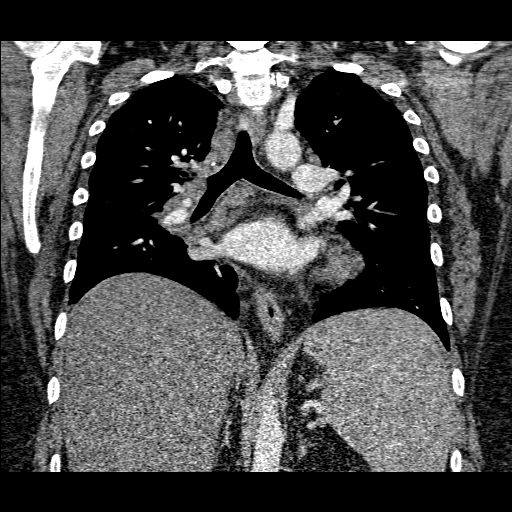

[19 of 36 positions shown; findings below may reference images not displayed]

CT angiogram chest with contrast:

Comparison 11 12 2005. Multidetector helical CT of   the thorax was obtained after
100 ml Omnipaque 300  IV. CT multiplanar reconstructions were rendered to
evaluate the vascular anatomy.
Fairly good contrast opacification of pulmonary artery branches. No definite
filling defects to suggest acute PE. Fairly good opacification of the thoracic
aorta which is normal in caliber, unremarkable. Significant interval  
progression of right hilar adenopathy with the confluence of nodes measuring up
to 4.3 cm maximum transverse diameter. There has been interval development of
bulky right paratracheal, pretracheal, precarinal, and subcarinal
lymphadenopathy. Subcentimeter paraesophageal nodes slightly increased in size
as well. No pleural or pericardial effusion. Vascular clips near the GE
junction. 4 mm right upper lobe nodule image 55 stable. No new pulmonary nodule 
evident. Coronal and sagittal reconstructions confirm the above findings.
IMPRESSION: 1. Negative for acute PE or thoracic aortic dissection.
2. Development of bulky right hilar and mediastinal adenopathy. Primary
considerations include metastatic disease versus infectious or inflammatory
etiologies. Followup recommended.
3. Stable 4 mm right upper lobe pulmonary nodule

## 2007-03-28 ENCOUNTER — Ambulatory Visit: Payer: Self-pay | Admitting: Internal Medicine

## 2007-03-30 ENCOUNTER — Ambulatory Visit: Payer: Self-pay | Admitting: Critical Care Medicine

## 2007-03-31 ENCOUNTER — Ambulatory Visit: Payer: Self-pay | Admitting: Cardiology

## 2007-04-10 ENCOUNTER — Ambulatory Visit: Payer: Self-pay | Admitting: Internal Medicine

## 2007-04-10 DIAGNOSIS — D860 Sarcoidosis of lung: Secondary | ICD-10-CM | POA: Insufficient documentation

## 2007-04-10 DIAGNOSIS — D869 Sarcoidosis, unspecified: Secondary | ICD-10-CM | POA: Insufficient documentation

## 2007-04-16 IMAGING — CT CT CHEST W/ CM
2 of 6 series · 15 of 36 positions shown, 18 images · IV contrast (omnipaque)
Comparison: 05/05/06 and 11/12/05.

CLINICAL DATA: Follow up right pulmonary nodule and hilar and mediastinal adenopathy. 
 CHEST CT WITH CONTRAST:
TECHNIQUE: Multidetector CT imaging of the chest was performed following the standard protocol during bolus administration of intravenous contrast.
 Contrast:  80 cc Omnipaque 300.

[Series 2: chest_rout_xxl 5.0 b31f st · axial · 0.82mm/px · z∈[-323,-73]mm · 12 of 56 slices shown, 15 images]
[im 3/56  mediastinal]
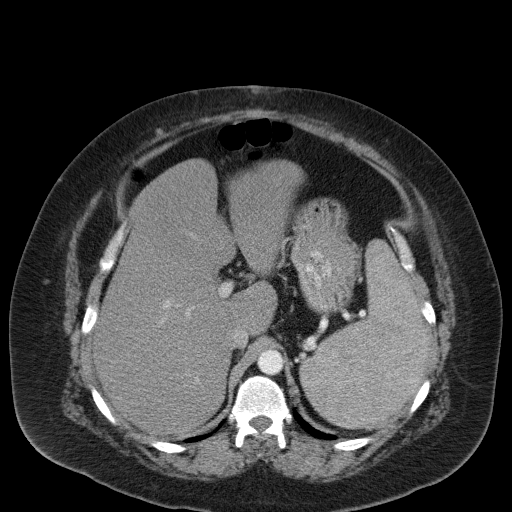
[im 3/56  lung]
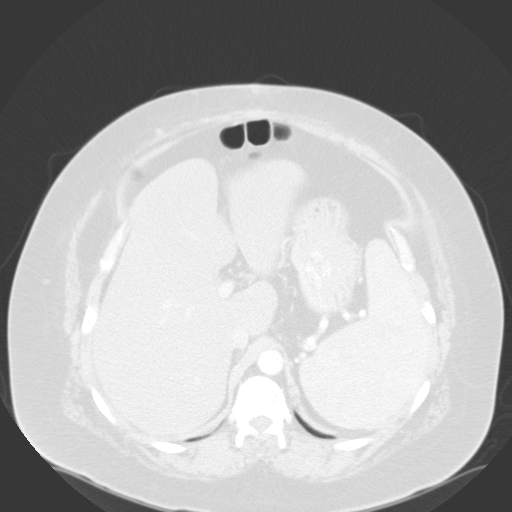
[im 7/56  lung]
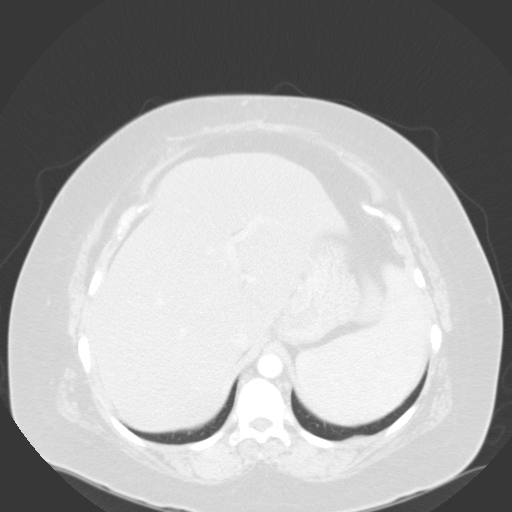
[im 12/56  lung]
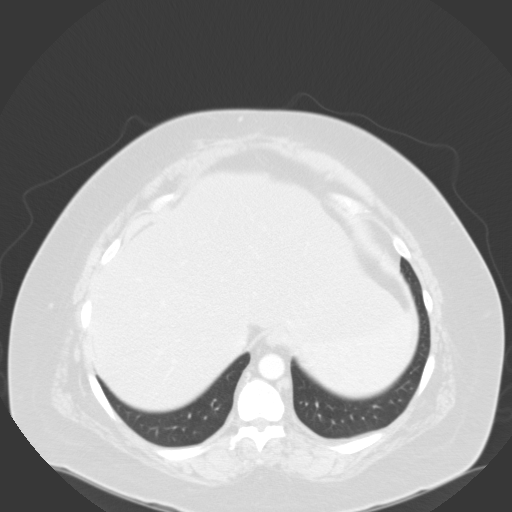
[im 17/56  lung]
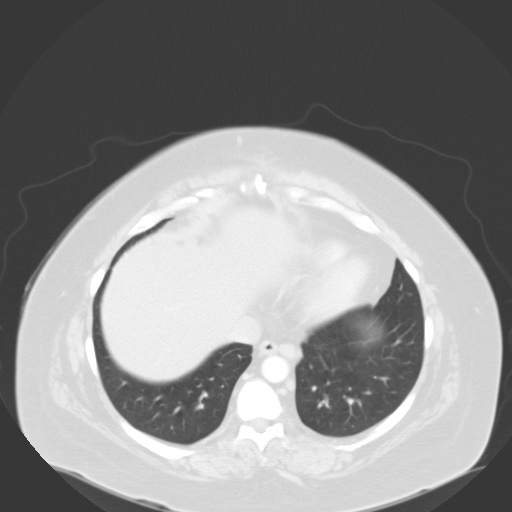
[im 21/56  mediastinal]
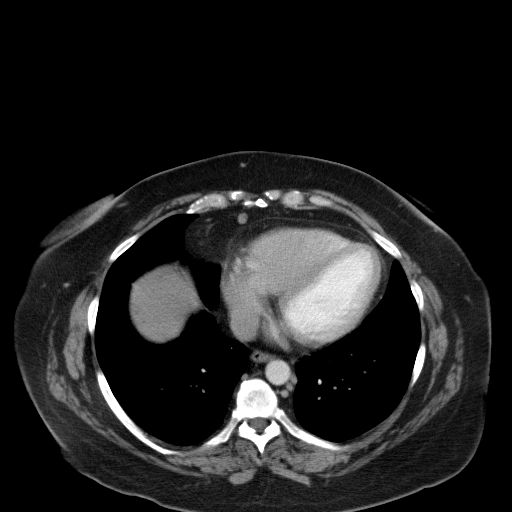
[im 21/56  lung]
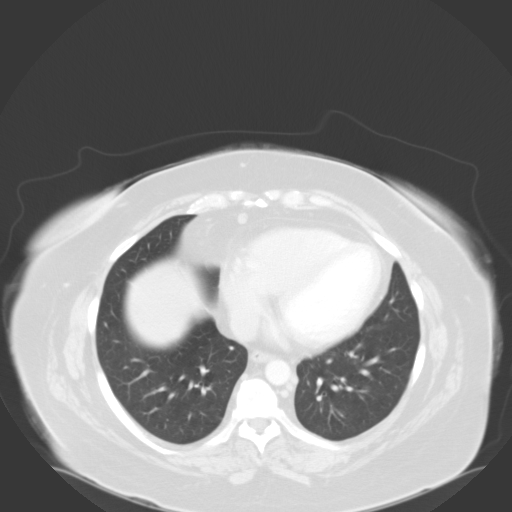
[im 26/56  lung]
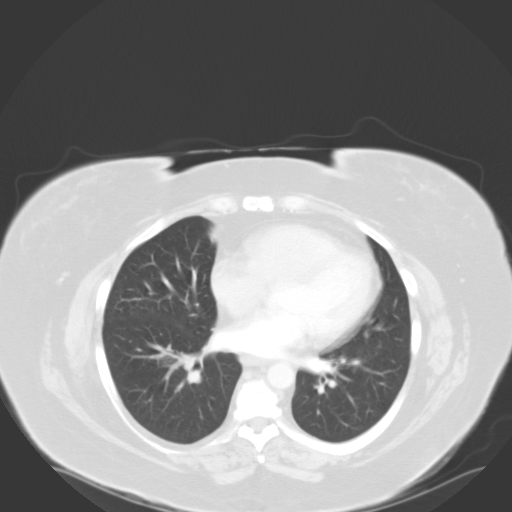
[im 30/56  lung]
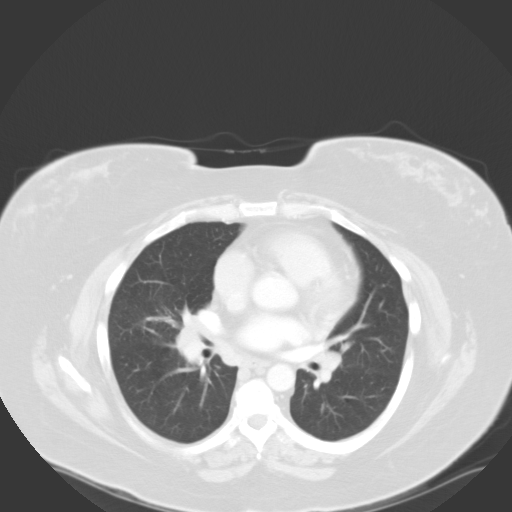
[im 35/56  lung]
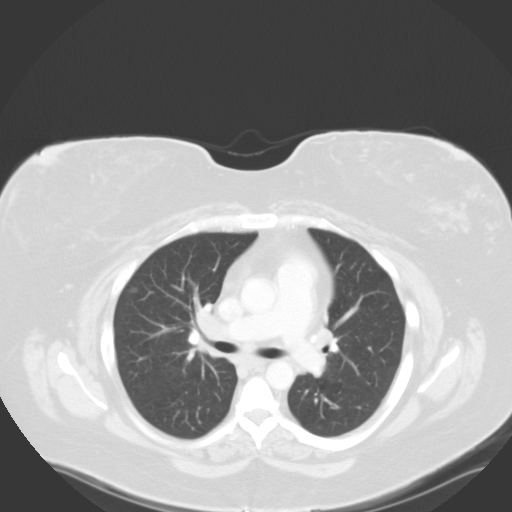
[im 39/56  mediastinal]
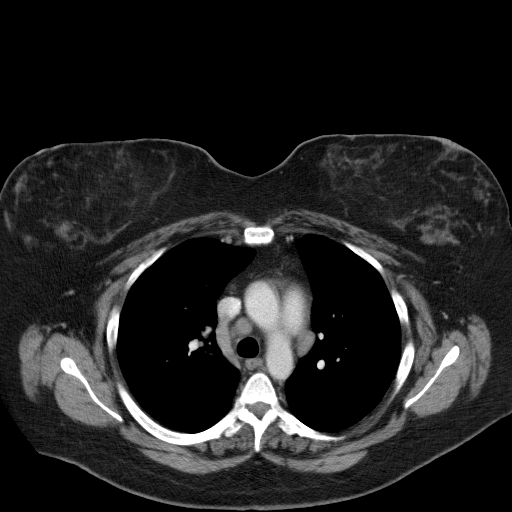
[im 39/56  lung]
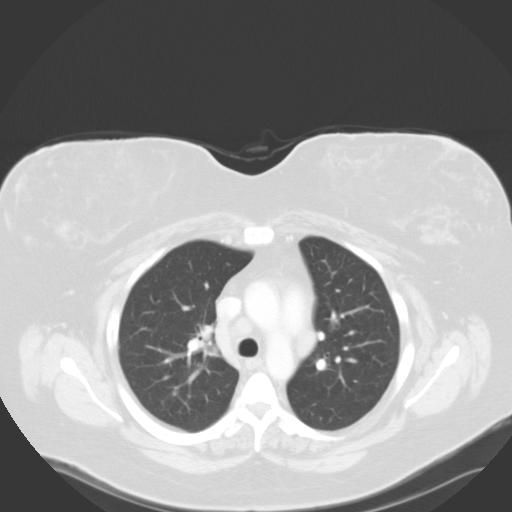
[im 44/56  lung]
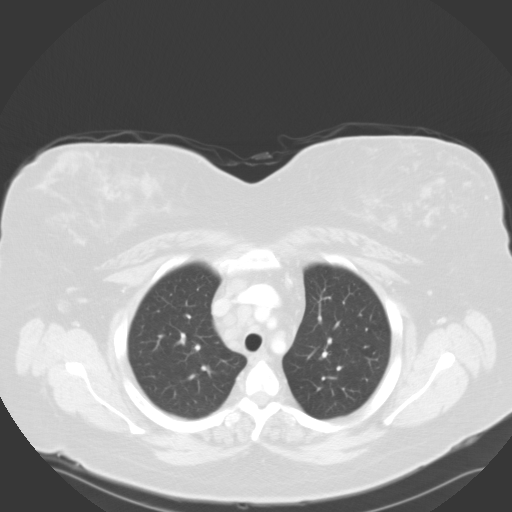
[im 49/56  lung]
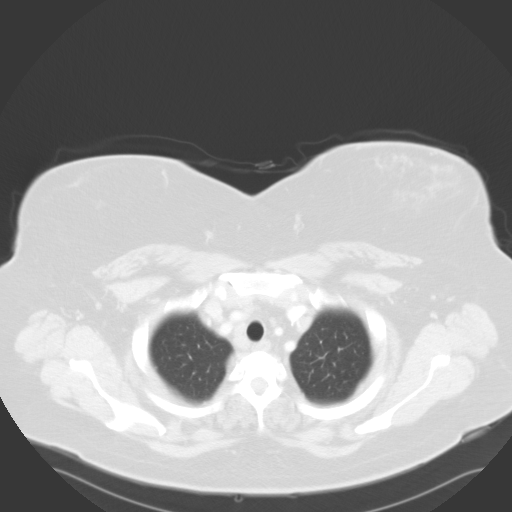
[im 53/56  lung]
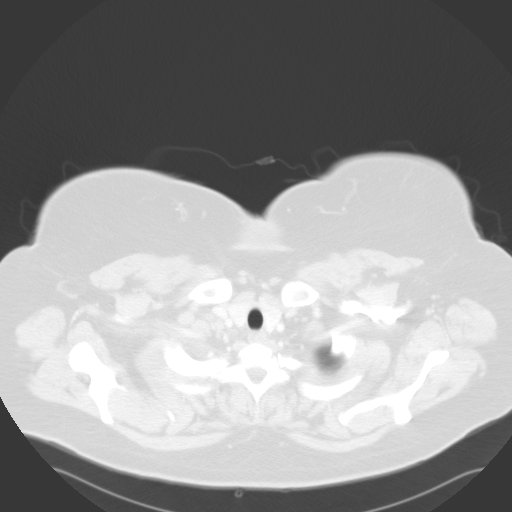

[Series 602: <mpr range> · coronal · 0.82mm/px · 3 of 39 slices shown]
[im 8/39  lung]
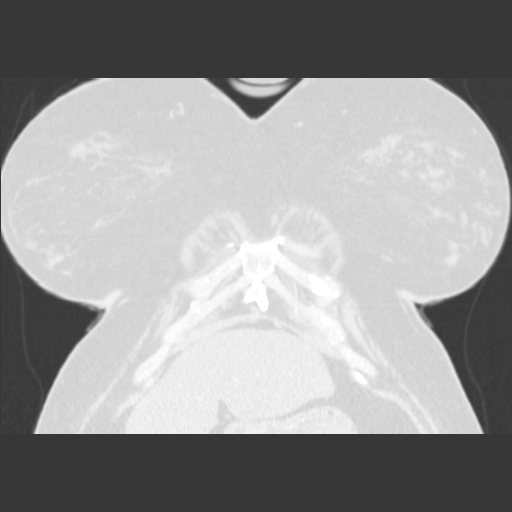
[im 16/39  lung]
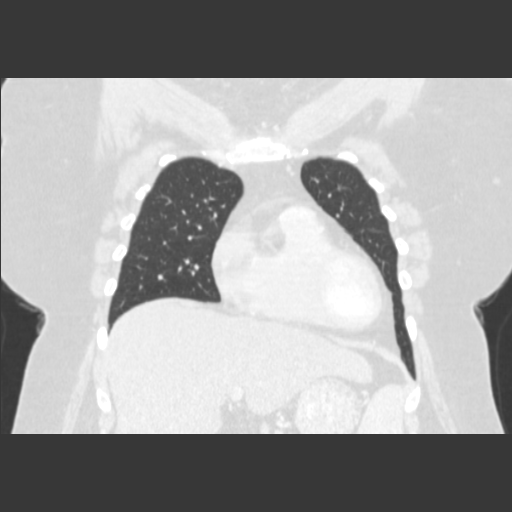
[im 23/39  lung]
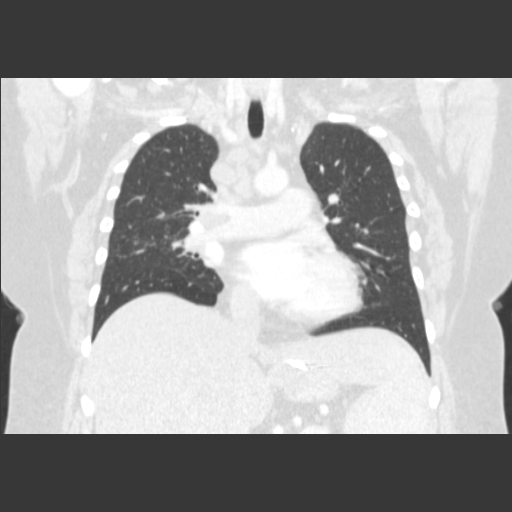

[15 of 36 positions shown; findings below may reference images not displayed]

FINDINGS: Right hilar and mediastinal adenopathy has not significantly changed since the most recent study on 05/05/06, although it has increased since prior study on 11/12/05 as noted previously.  No new or worsening areas of lymphadenopathy are identified.  A 4-5 mm nodule is again seen in the peripheral right upper lobe which remains stable.  No new or enlarging pulmonary nodules or masses are seen.  There is no evidence of pulmonary infiltrate or pleural effusion.
IMPRESSION: 1.  Stable right hilar and mediastinal adenopathy since most recent study on 05/05/06.  No new or worsening disease noted.  Consider further CT follow up in another 1 to 2 months versus biopsy.
 2.  Stable tiny right upper lobe pulmonary nodule.

## 2007-04-20 ENCOUNTER — Ambulatory Visit: Payer: Self-pay | Admitting: Critical Care Medicine

## 2007-05-24 ENCOUNTER — Ambulatory Visit: Payer: Self-pay | Admitting: Internal Medicine

## 2007-05-24 LAB — CONVERTED CEMR LAB
ALT: 23 units/L (ref 0–35)
AST: 24 units/L (ref 0–37)
Albumin: 3.8 g/dL (ref 3.5–5.2)
Alkaline Phosphatase: 111 units/L (ref 39–117)
BUN: 8 mg/dL (ref 6–23)
Bilirubin, Direct: 0.2 mg/dL (ref 0.0–0.3)
CO2: 30 meq/L (ref 19–32)
Calcium: 9.6 mg/dL (ref 8.4–10.5)
Chloride: 99 meq/L (ref 96–112)
Cholesterol: 267 mg/dL (ref 0–200)
Creatinine, Ser: 0.8 mg/dL (ref 0.4–1.2)
Creatinine,U: 139.2 mg/dL
Direct LDL: 166.4 mg/dL
GFR calc Af Amer: 99 mL/min
GFR calc non Af Amer: 82 mL/min
Glucose, Bld: 97 mg/dL (ref 70–99)
HDL: 37.5 mg/dL — ABNORMAL LOW (ref >39.0)
Hgb A1c MFr Bld: 6 % (ref 4.6–6.0)
Microalb Creat Ratio: 2.2 mg/g (ref 0.0–30.0)
Microalb, Ur: 0.3 mg/dL (ref 0.0–1.9)
Potassium: 3.7 meq/L (ref 3.5–5.1)
Sodium: 139 meq/L (ref 135–145)
Total Bilirubin: 0.9 mg/dL (ref 0.3–1.2)
Total CHOL/HDL Ratio: 7.1
Total Protein: 6.4 g/dL (ref 6.0–8.3)
Triglycerides: 349 mg/dL (ref 0–149)
VLDL: 70 mg/dL — ABNORMAL HIGH (ref 0–40)

## 2007-05-31 ENCOUNTER — Ambulatory Visit: Payer: Self-pay | Admitting: Internal Medicine

## 2007-06-07 IMAGING — CT CT CHEST W/ CM
2 of 4 series · 15 of 36 positions shown, 18 images · IV contrast (omnipaque)
Comparison: 06/06/06.

CLINICAL DATA: Mediastinal lymphadenopathy.  
CHEST CT WITH CONTRAST:
TECHNIQUE: Multidetector CT imaging of the chest was performed following the standard protocol during bolus administration of intravenous contrast.
Contrast:  80 cc Omnipaque 300.

[Series 2: chest_rout_xxl 5.0 b31f st · axial · 0.80mm/px · z∈[-316,-50]mm · 12 of 63 slices shown, 15 images]
[im 5/63  mediastinal]
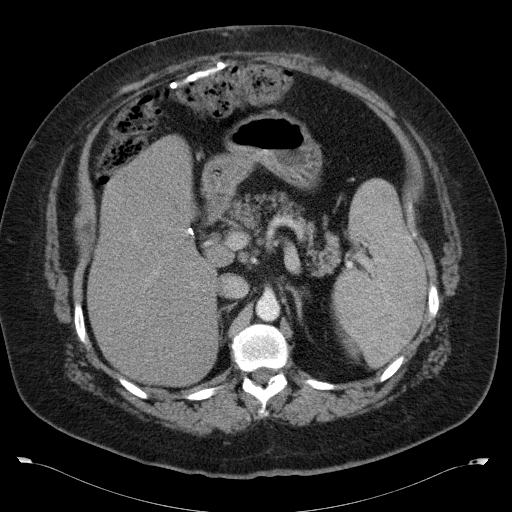
[im 5/63  lung]
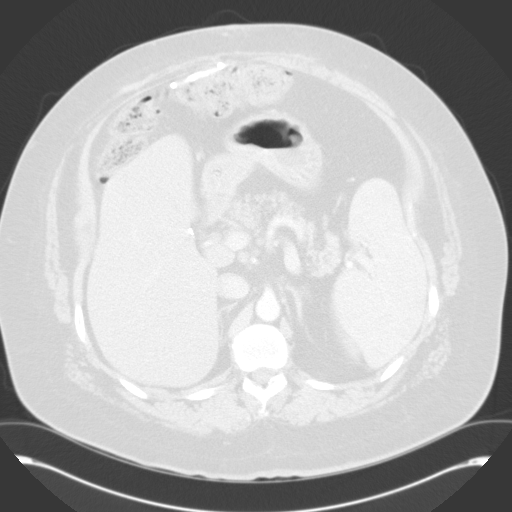
[im 10/63  lung]
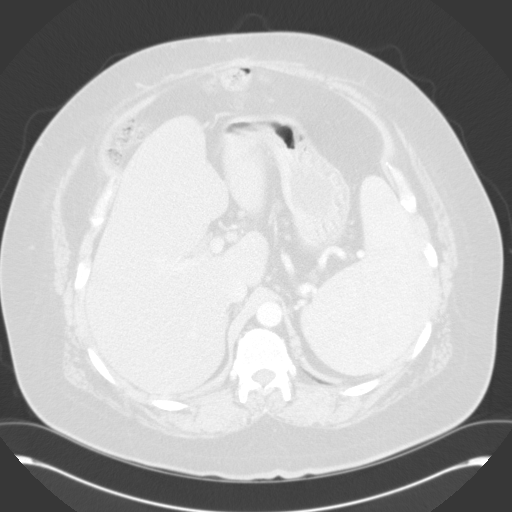
[im 15/63  lung]
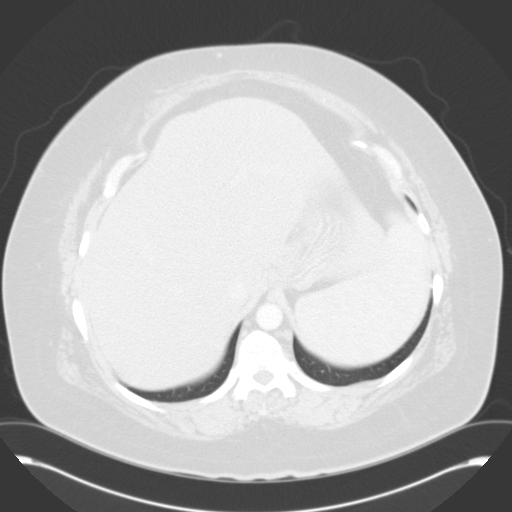
[im 20/63  lung]
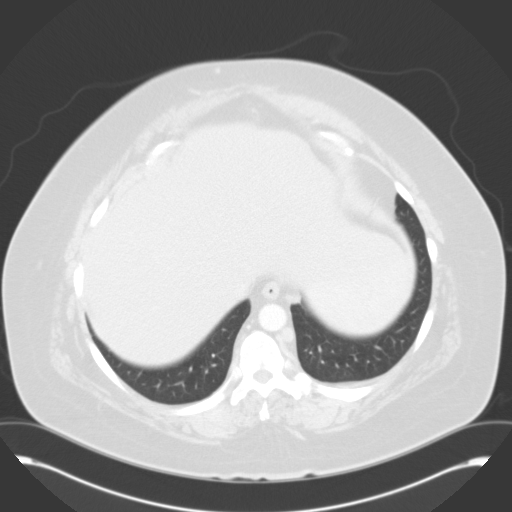
[im 24/63  mediastinal]
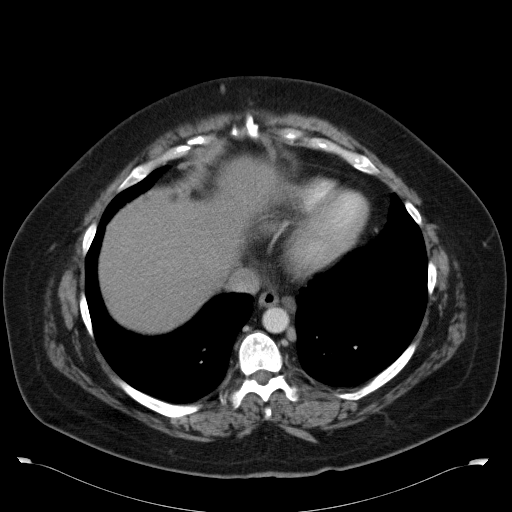
[im 24/63  lung]
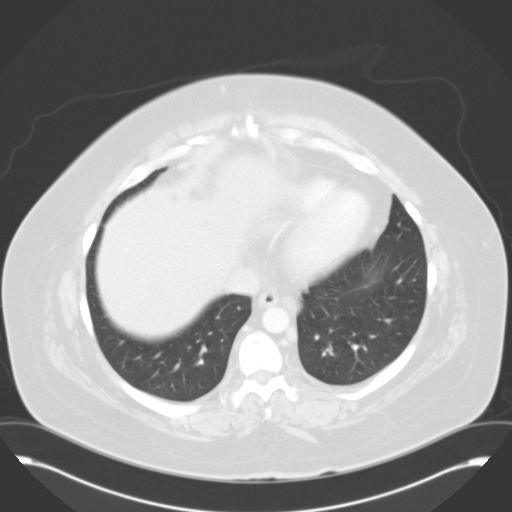
[im 29/63  lung]
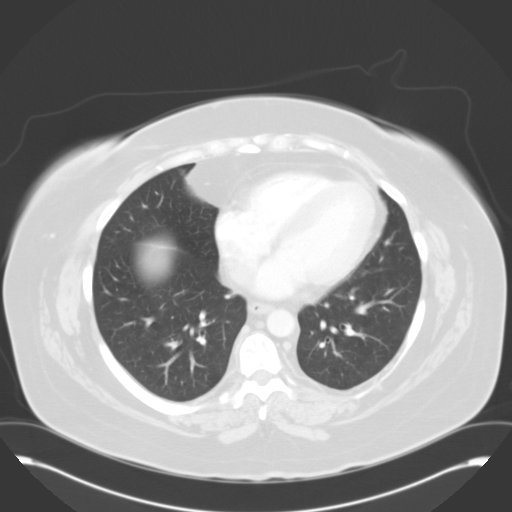
[im 34/63  lung]
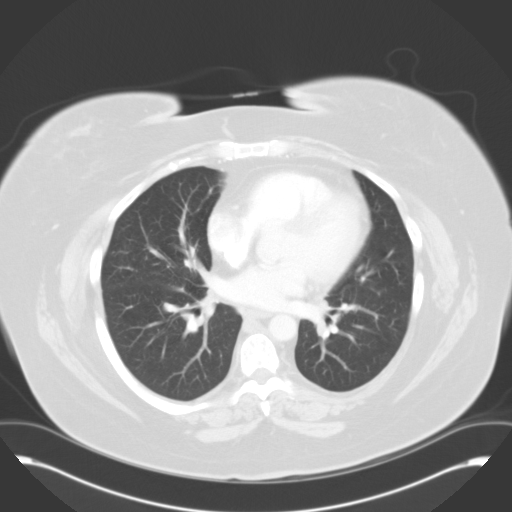
[im 39/63  lung]
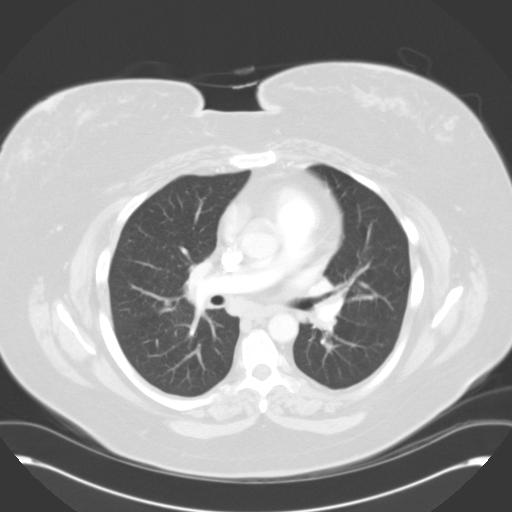
[im 43/63  mediastinal]
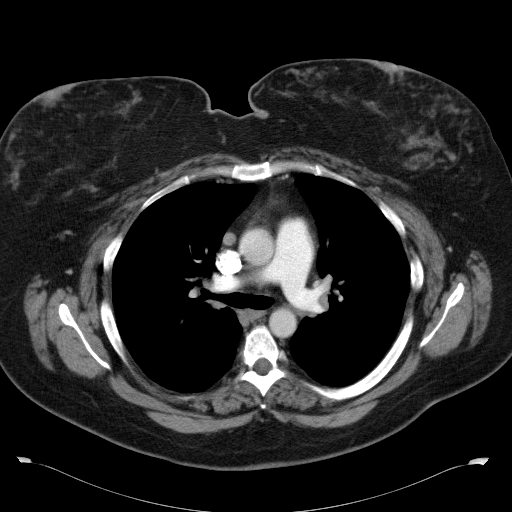
[im 43/63  lung]
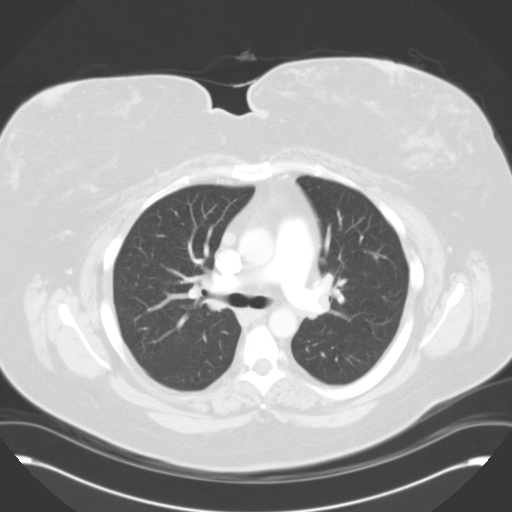
[im 48/63  lung]
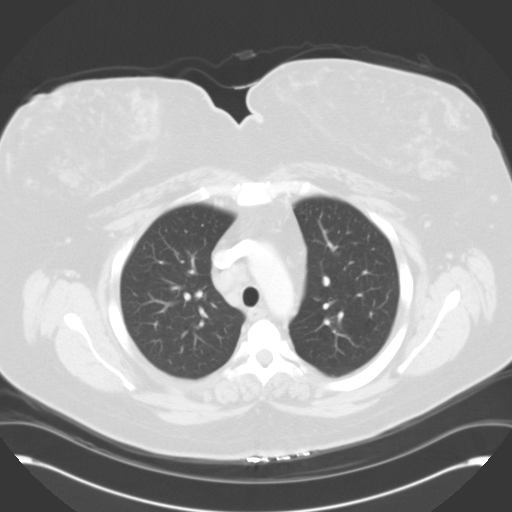
[im 53/63  lung]
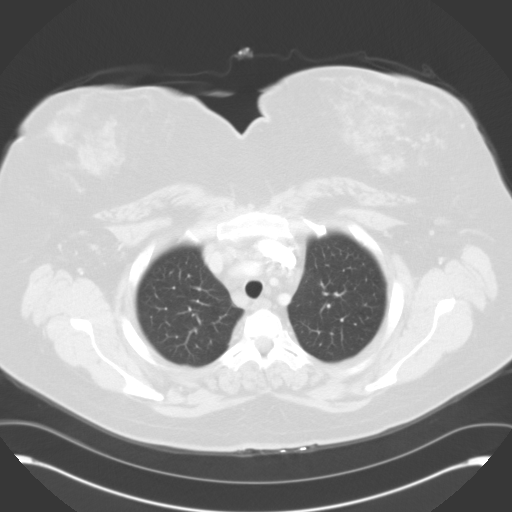
[im 58/63  lung]
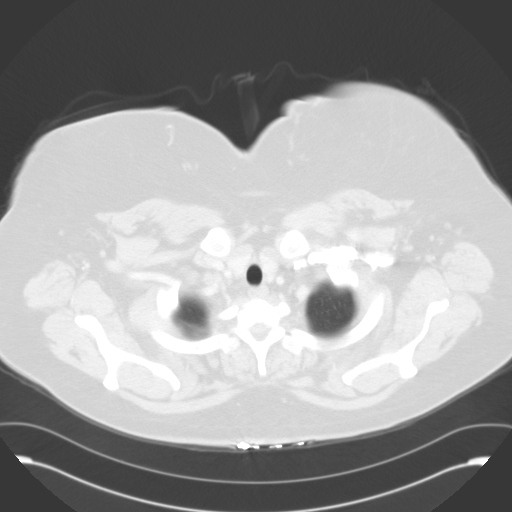

[Series 602: <mpr range> · coronal · 0.80mm/px · 3 of 39 slices shown]
[im 8/39  lung]
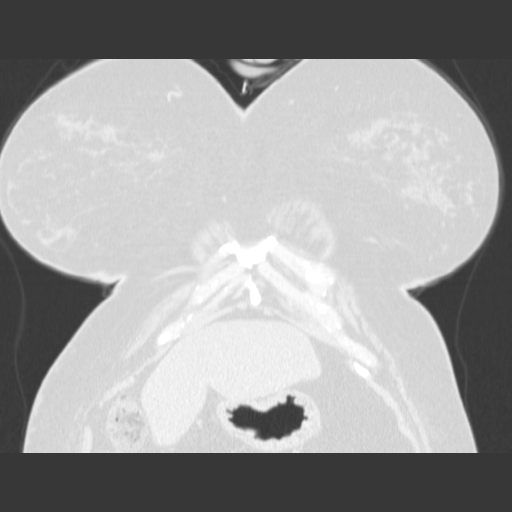
[im 16/39  lung]
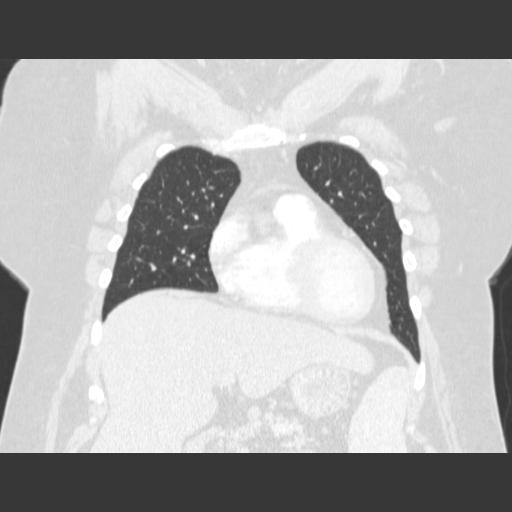
[im 23/39  lung]
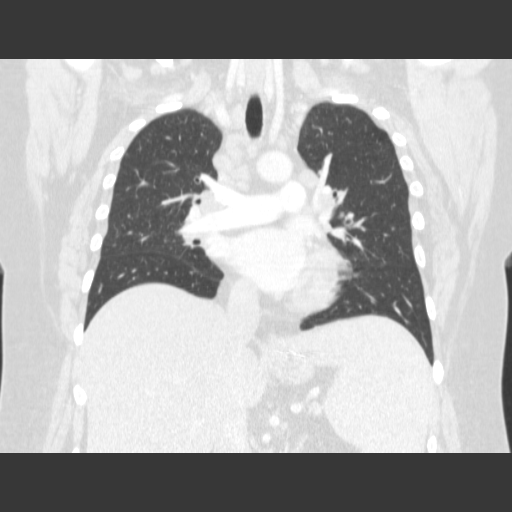

[15 of 36 positions shown; findings below may reference images not displayed]

FINDINGS: No enlarged axillary lymph nodes.  Again seen are multiple enlarged mediastinal and bilateral hilar lymph nodes.  For comparison purposes, prevascular lymph node measures 12.9 x 26.3 mm (image #8).  This is compared with 23.7 x 13.8 mm previously.  Right paratracheal lymph node measures 10.8 x 15.1 mm compared with 14.7 x 18.1 mm previously.  There is a right paratracheal lymph node which measures 16.5 x 12.7 mm compared with the same previously.  Right hilar lymph node measures 26.7 mm x 15.4 mm compared with the same previously. 
There is no pleural or pericardial effusions.  
Within the right upper lobe, there is a pulmonary nodule which measures 5.6 mm, unchanged from prior exam (image #23).  No additional pulmonary nodules or masses are noted.
Review of bone windows shows no suspicious lytic or sclerotic lesions.
IMPRESSION: 1. Stable mediastinal and hilar adenopathy.  No new or worsening disease noted.  
2. Stable right upper lobe pulmonary nodule.

## 2007-06-13 IMAGING — CR DG CHEST 1V PORT
1 series · 1 of 1 positions shown · non-contrast
Comparison: 03/26/2006

CLINICAL DATA: Chest pain

PORTABLE CHEST - 1 VIEW:

[view not recorded]
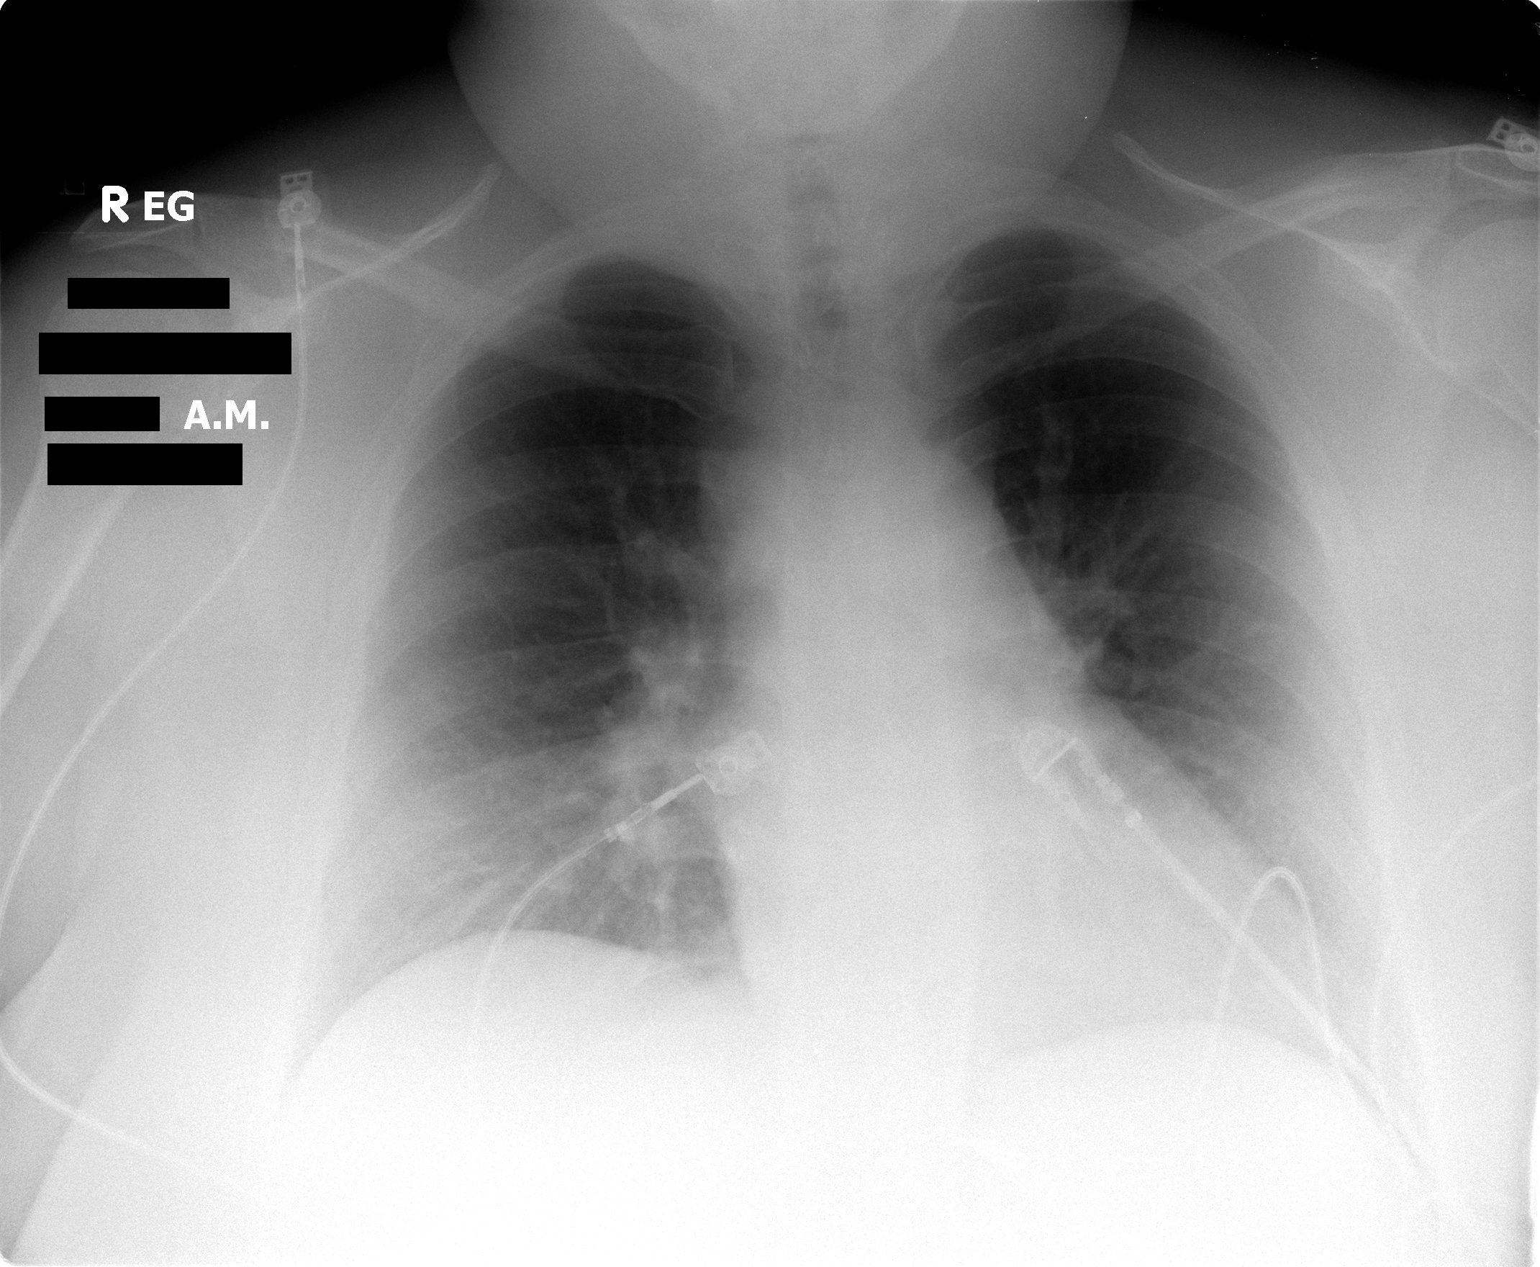

[1 of 1 positions shown; findings below may reference images not displayed]

FINDINGS: The heart size and mediastinal contours are within normal limits. 
Both lungs are clear.  No effusions. Visualized skeleton unremarkable.
IMPRESSION: No acute disease.

## 2007-06-21 ENCOUNTER — Ambulatory Visit: Payer: Self-pay | Admitting: Critical Care Medicine

## 2007-06-21 LAB — CONVERTED CEMR LAB
ALT: 28 units/L (ref 0–35)
AST: 28 units/L (ref 0–37)
Albumin: 3.7 g/dL (ref 3.5–5.2)
Alkaline Phosphatase: 95 units/L (ref 39–117)
BUN: 9 mg/dL (ref 6–23)
Basophils Absolute: 0 10*3/uL (ref 0.0–0.1)
Basophils Relative: 0.3 % (ref 0.0–1.0)
Bilirubin, Direct: 0.2 mg/dL (ref 0.0–0.3)
CO2: 30 meq/L (ref 19–32)
Calcium: 9.1 mg/dL (ref 8.4–10.5)
Chloride: 105 meq/L (ref 96–112)
Creatinine, Ser: 0.7 mg/dL (ref 0.4–1.2)
Eosinophils Absolute: 0.3 10*3/uL (ref 0.0–0.6)
Eosinophils Relative: 2 % (ref 0.0–5.0)
GFR calc Af Amer: 115 mL/min
GFR calc non Af Amer: 95 mL/min
Glucose, Bld: 112 mg/dL — ABNORMAL HIGH (ref 70–99)
HCT: 33.9 % — ABNORMAL LOW (ref 36.0–46.0)
Hemoglobin: 11.9 g/dL — ABNORMAL LOW (ref 12.0–15.0)
Lymphocytes Relative: 25.6 % (ref 12.0–46.0)
MCHC: 35.1 g/dL (ref 30.0–36.0)
MCV: 89.7 fL (ref 78.0–100.0)
Monocytes Absolute: 0.5 10*3/uL (ref 0.2–0.7)
Monocytes Relative: 3.2 % (ref 3.0–11.0)
Neutro Abs: 10.1 10*3/uL — ABNORMAL HIGH (ref 1.4–7.7)
Neutrophils Relative %: 68.9 % (ref 43.0–77.0)
Platelets: 380 10*3/uL (ref 150–400)
Potassium: 3.9 meq/L (ref 3.5–5.1)
RBC: 3.78 M/uL — ABNORMAL LOW (ref 3.87–5.11)
RDW: 12.1 % (ref 11.5–14.6)
Sodium: 141 meq/L (ref 135–145)
Total Bilirubin: 0.8 mg/dL (ref 0.3–1.2)
Total Protein: 6.9 g/dL (ref 6.0–8.3)
WBC: 14.7 10*3/uL — ABNORMAL HIGH (ref 4.5–10.5)

## 2007-06-29 ENCOUNTER — Telehealth (INDEPENDENT_AMBULATORY_CARE_PROVIDER_SITE_OTHER): Payer: Self-pay | Admitting: *Deleted

## 2007-07-05 ENCOUNTER — Ambulatory Visit: Payer: Self-pay | Admitting: Internal Medicine

## 2007-07-10 ENCOUNTER — Telehealth: Payer: Self-pay | Admitting: Internal Medicine

## 2007-07-19 ENCOUNTER — Ambulatory Visit: Payer: Self-pay | Admitting: Internal Medicine

## 2007-07-20 IMAGING — CT CT CHEST W/ CM
2 of 3 series · 15 of 36 positions shown, 18 images · IV contrast (omnipaque)
Comparison: 07/28/06.

CLINICAL DATA: Mediastinal adenopathy. Chest pain. Short of breath.
 CHEST CT WITH CONTRAST:
TECHNIQUE: Multidetector CT imaging of the chest was performed following the standard protocol during bolus administration of intravenous contrast.
 Contrast:  80 cc Omnipaque 300

[Series 2: chest_rout_xxl 5.0 b31f st · axial · 0.75mm/px · z∈[-276,-46]mm · 12 of 55 slices shown, 15 images]
[im 5/55  mediastinal]
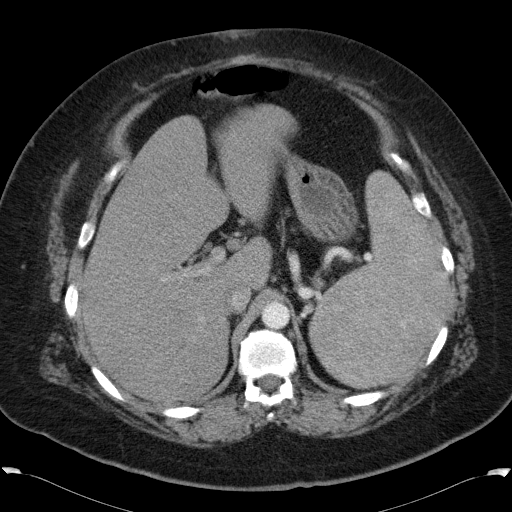
[im 5/55  lung]
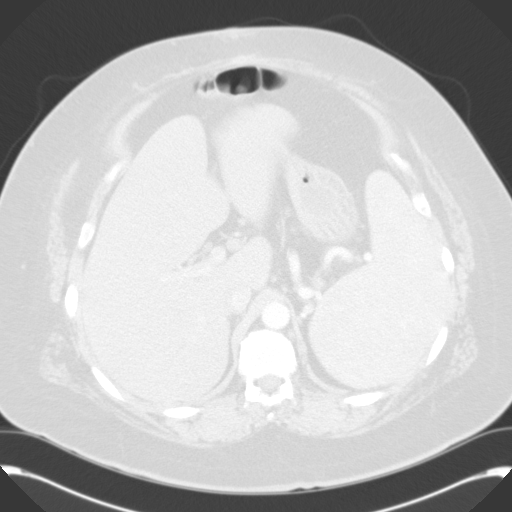
[im 9/55  lung]
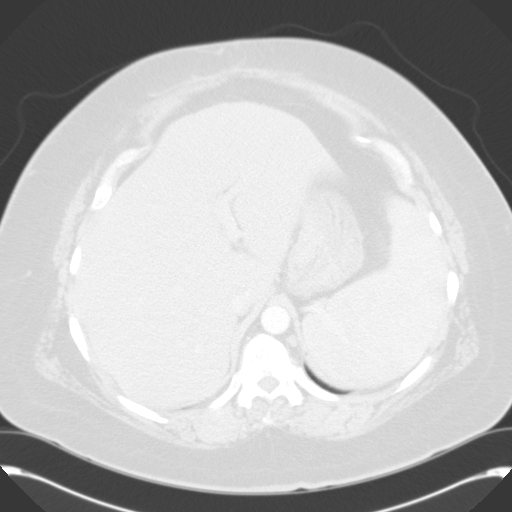
[im 13/55  lung]
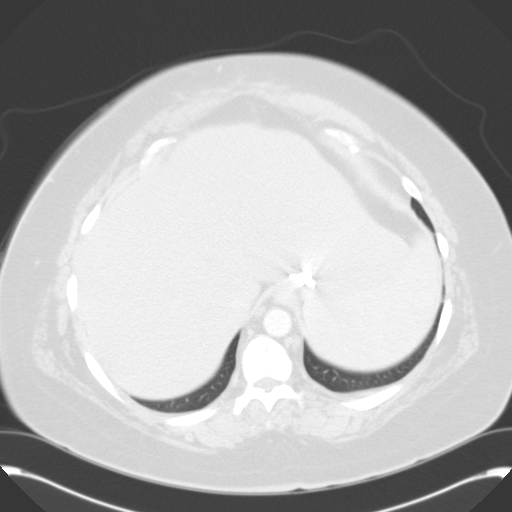
[im 17/55  lung]
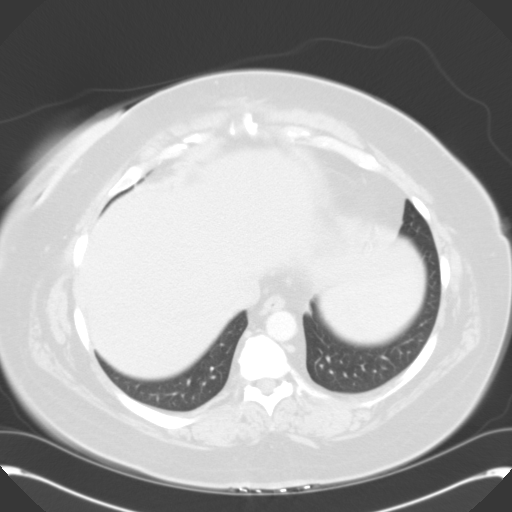
[im 21/55  mediastinal]
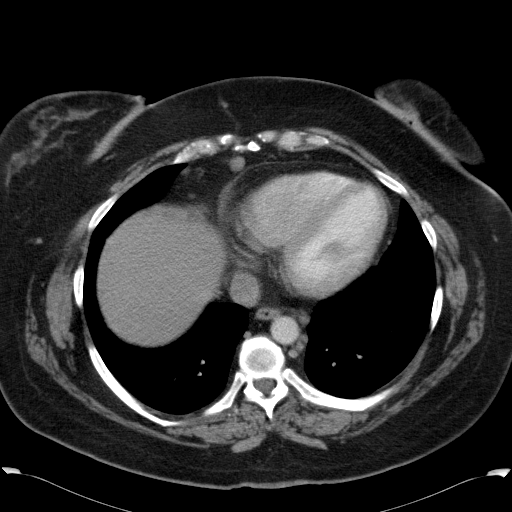
[im 21/55  lung]
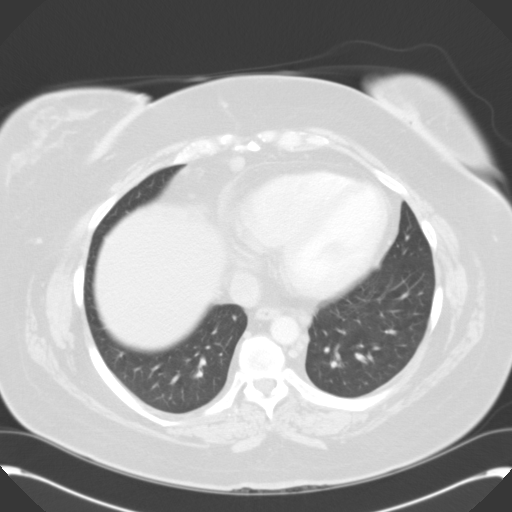
[im 25/55  lung]
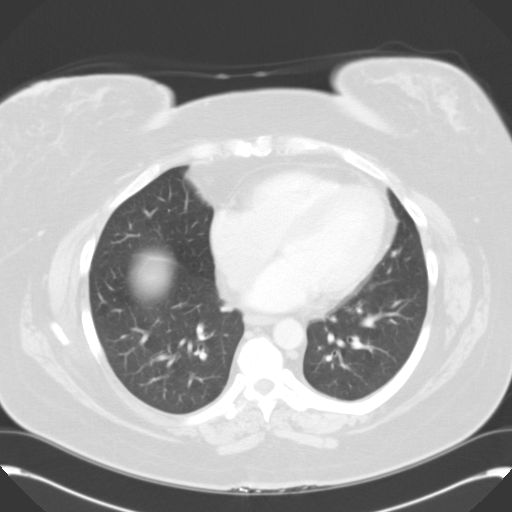
[im 31/55  lung]
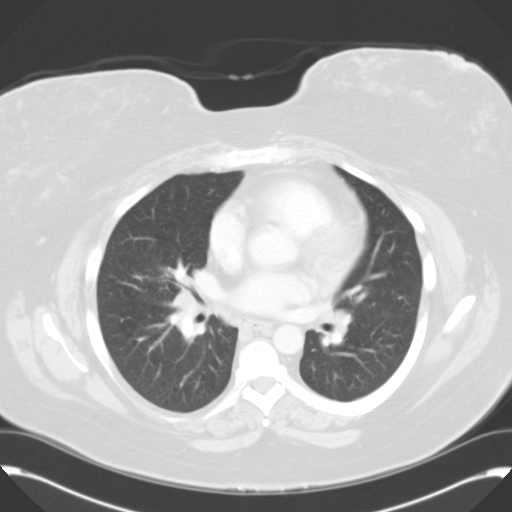
[im 35/55  lung]
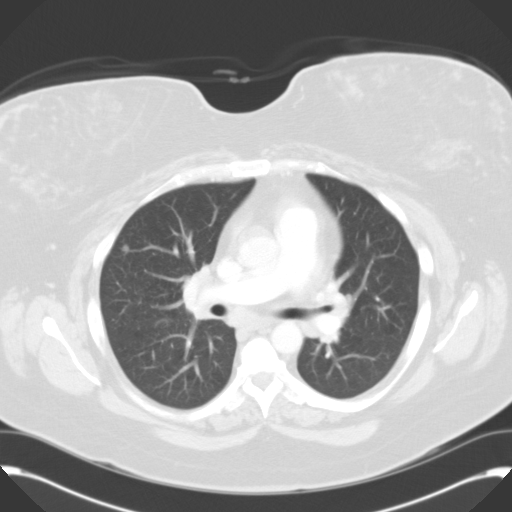
[im 39/55  mediastinal]
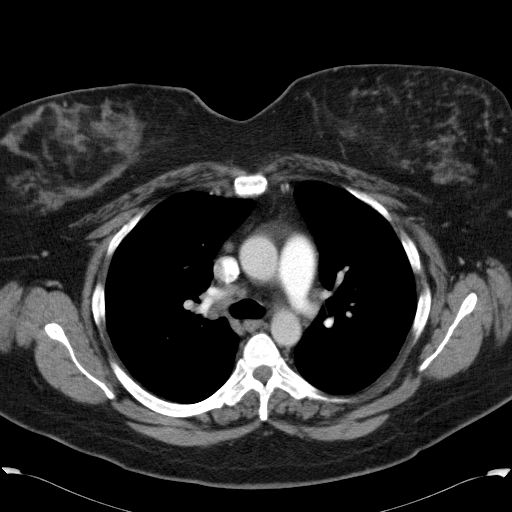
[im 39/55  lung]
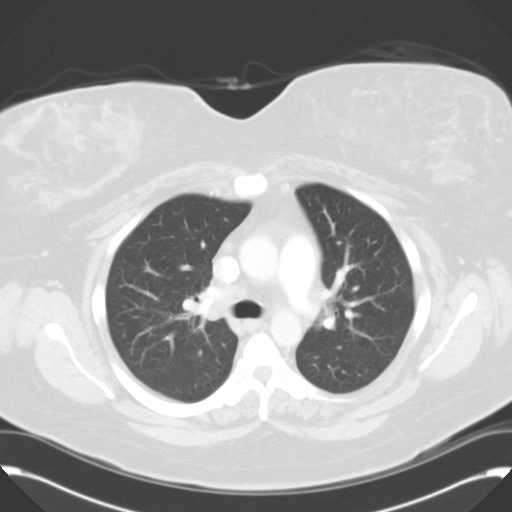
[im 43/55  lung]
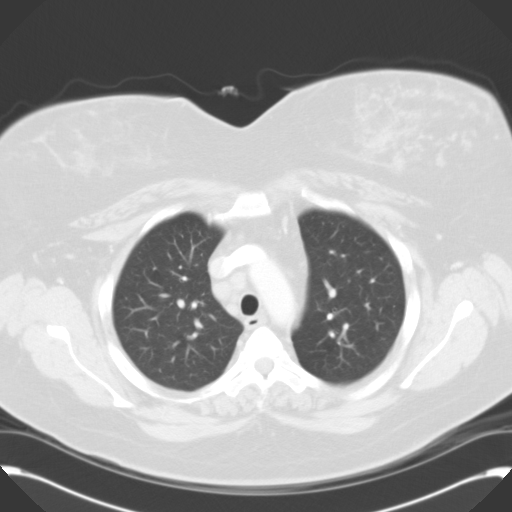
[im 47/55  lung]
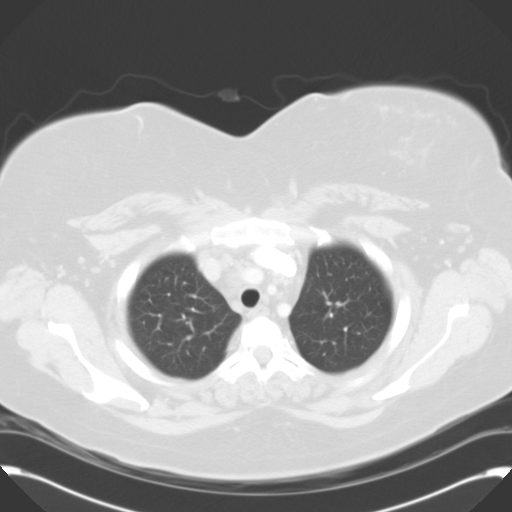
[im 51/55  lung]
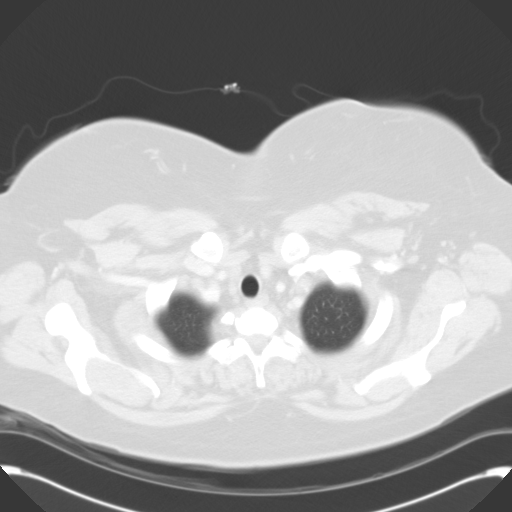

[Series 602: <mpr range> · coronal · 0.75mm/px · 3 of 39 slices shown]
[im 8/39  lung]
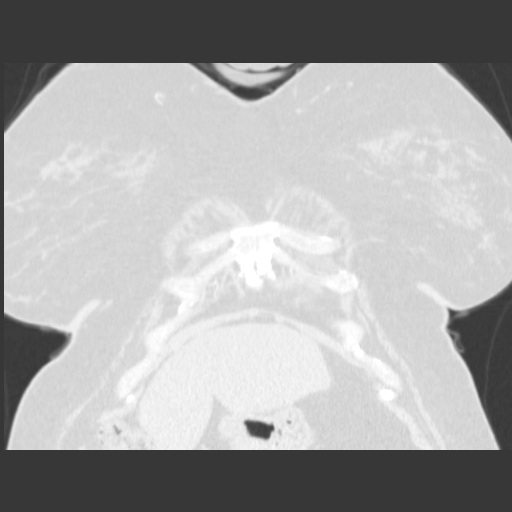
[im 16/39  lung]
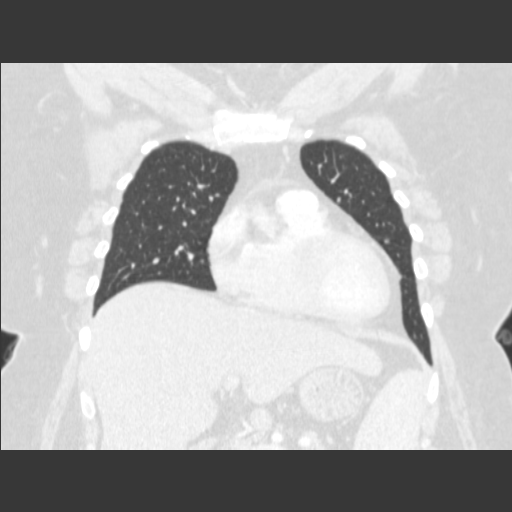
[im 23/39  lung]
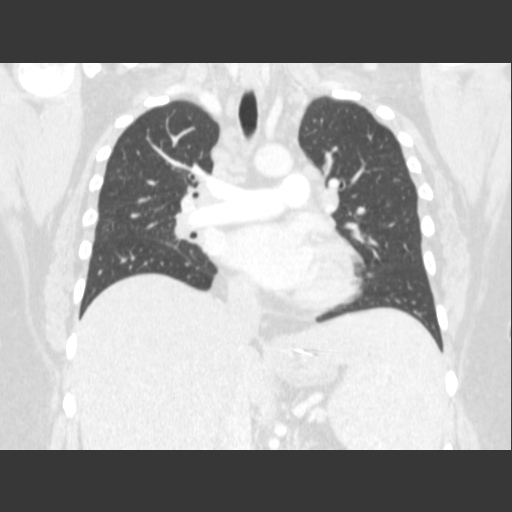

[15 of 36 positions shown; findings below may reference images not displayed]

FINDINGS: A 13 mm short axis diameter prevascular node on image 16 is stable. An 11 mm short axis diameter pretracheal node on image 16 is stable. A 15 mm short axis diameter right hilar node on image 20 is stable. An 11 mm short axis diameter left hilar node is not significantly changed. Other smaller nodes are grossly unchanged.  Negative abnormal axillary adenopathy.
 No pneumothoraces or effusions are seen.
 A 6 mm right upper lobe nodule is stable.
 In the abdomen, periportal adenopathy is present.  There is an 18 mm short axis diameter portal node on image 53.  This was partially imaged on the prior study.  Compared to the [REDACTED] study, this portal node has increased.
IMPRESSION: 1.  Stable mediastinal and hilar adenopathy.
 2.  Periportal lymph node slightly enlarged.
 3.  Stable right upper lobe nodule.

## 2007-08-15 ENCOUNTER — Ambulatory Visit: Payer: Self-pay | Admitting: Critical Care Medicine

## 2007-08-22 ENCOUNTER — Encounter: Payer: Self-pay | Admitting: Internal Medicine

## 2007-08-25 ENCOUNTER — Ambulatory Visit: Payer: Self-pay | Admitting: Internal Medicine

## 2007-08-25 LAB — CONVERTED CEMR LAB
ALT: 33 units/L (ref 0–35)
AST: 24 units/L (ref 0–37)
Albumin: 4 g/dL (ref 3.5–5.2)
Alkaline Phosphatase: 93 units/L (ref 39–117)
BUN: 14 mg/dL (ref 6–23)
Bilirubin, Direct: 0.2 mg/dL (ref 0.0–0.3)
CO2: 34 meq/L — ABNORMAL HIGH (ref 19–32)
Calcium: 9.8 mg/dL (ref 8.4–10.5)
Chloride: 95 meq/L — ABNORMAL LOW (ref 96–112)
Cholesterol: 215 mg/dL (ref 0–200)
Creatinine, Ser: 0.7 mg/dL (ref 0.4–1.2)
Direct LDL: 126.1 mg/dL
GFR calc Af Amer: 115 mL/min
GFR calc non Af Amer: 95 mL/min
Glucose, Bld: 134 mg/dL — ABNORMAL HIGH (ref 70–99)
HDL: 44.4 mg/dL (ref >39.0)
Hgb A1c MFr Bld: 7 % — ABNORMAL HIGH (ref 4.6–6.0)
Potassium: 3.7 meq/L (ref 3.5–5.1)
Sodium: 141 meq/L (ref 135–145)
Total Bilirubin: 1.1 mg/dL (ref 0.3–1.2)
Total CHOL/HDL Ratio: 4.8
Total Protein: 6.7 g/dL (ref 6.0–8.3)
Triglycerides: 360 mg/dL (ref 0–149)
VLDL: 72 mg/dL — ABNORMAL HIGH (ref 0–40)

## 2007-09-14 ENCOUNTER — Ambulatory Visit: Payer: Self-pay | Admitting: Critical Care Medicine

## 2007-09-15 ENCOUNTER — Ambulatory Visit: Payer: Self-pay | Admitting: Internal Medicine

## 2007-09-15 LAB — CONVERTED CEMR LAB
Bilirubin Urine: NEGATIVE
Blood in Urine, dipstick: NEGATIVE
Glucose, Urine, Semiquant: NEGATIVE
Nitrite: NEGATIVE
Protein, U semiquant: NEGATIVE
Specific Gravity, Urine: 1.025
Urobilinogen, UA: 0.2
pH: 5

## 2007-09-17 IMAGING — CR DG CHEST 2V
2 series · 2 of 2 positions shown · non-contrast
Comparison: 08/03/06.

CLINICAL DATA: Mediastinal adenopathy.  Sleep apnea.  Preop respiratory exam.  
CHEST - 2 VIEW:

[view not recorded (1 of 2)]
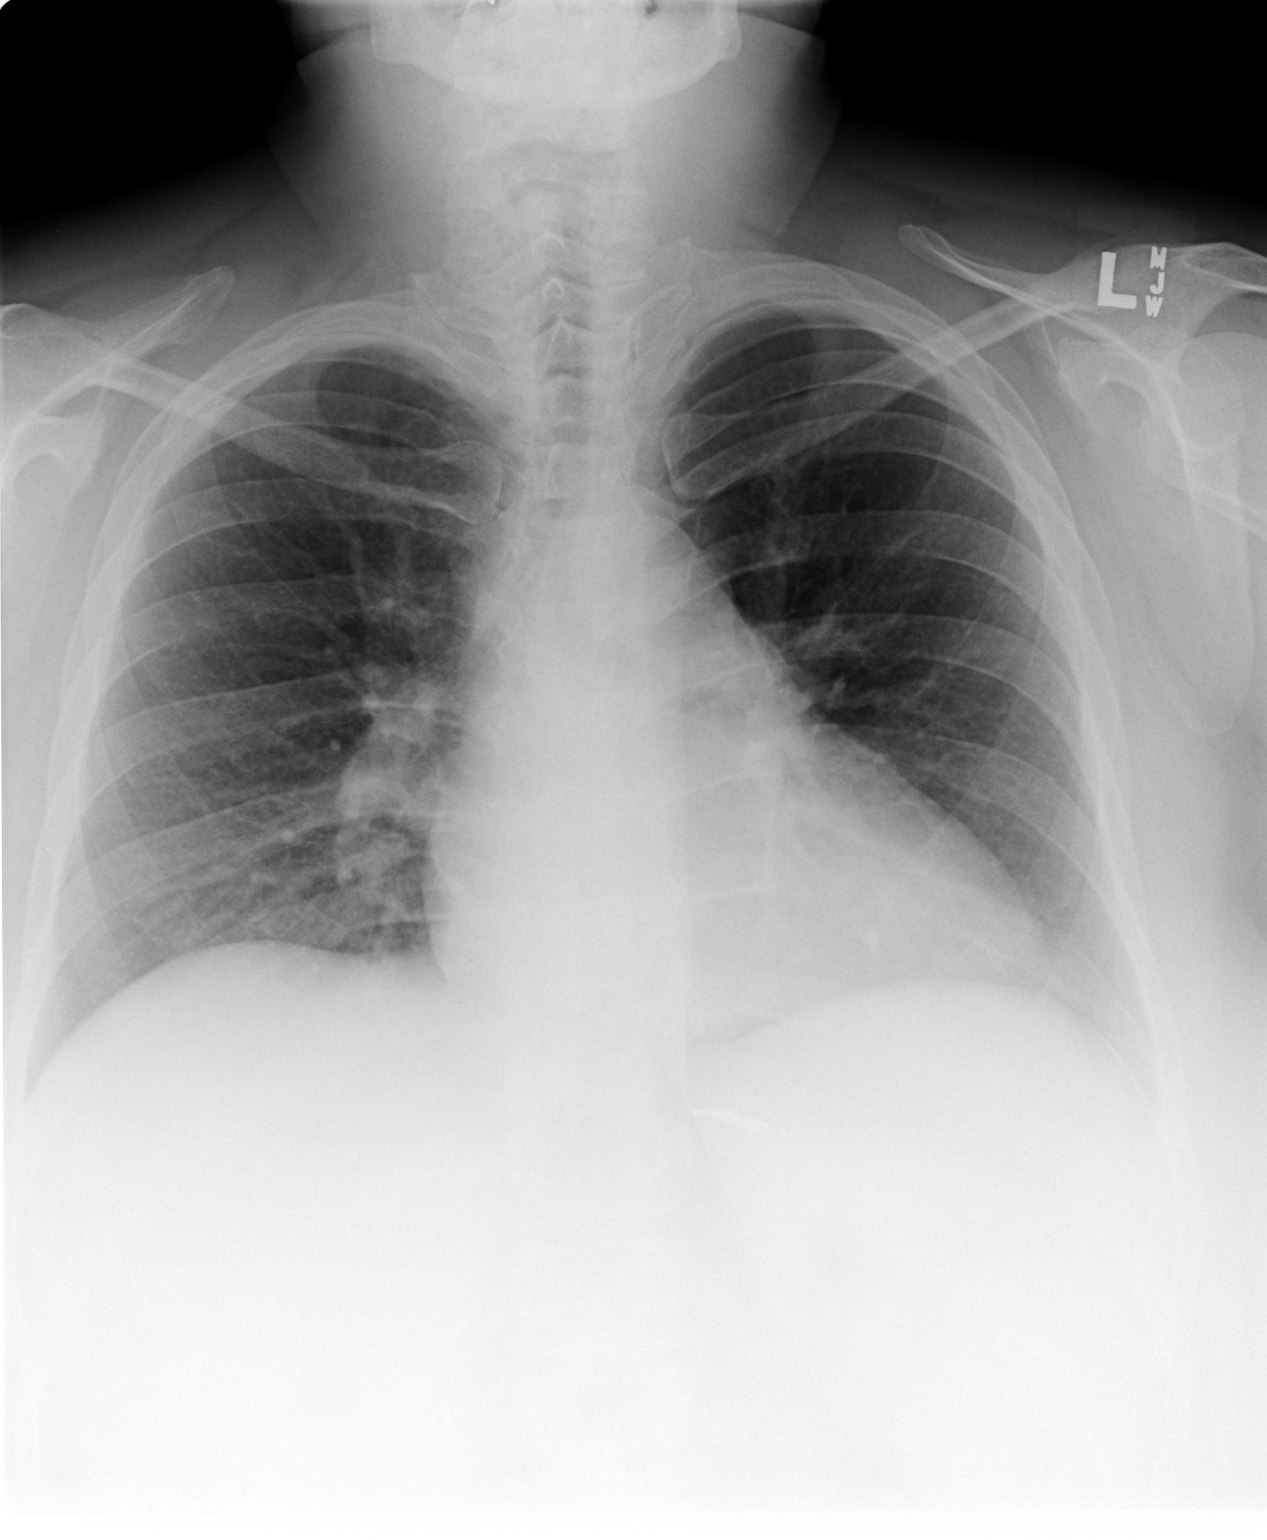

[view not recorded (2 of 2)]
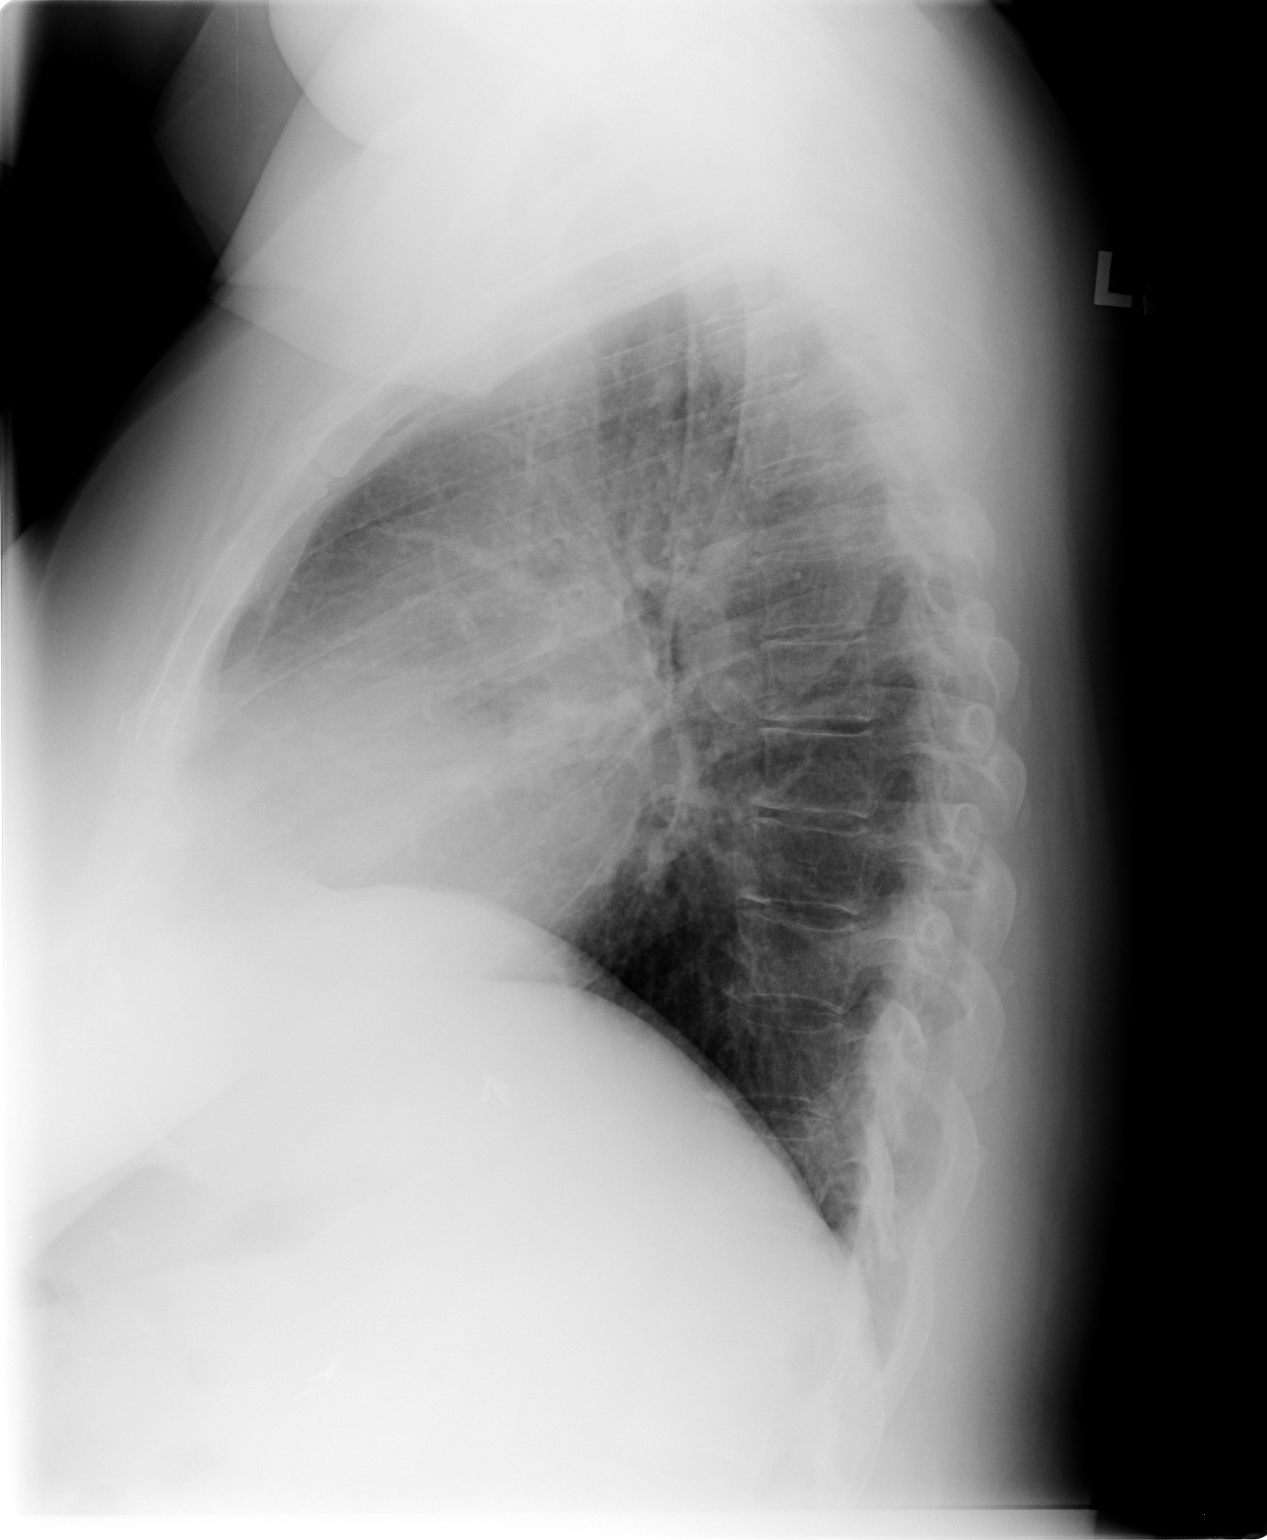

[2 of 2 positions shown; findings below may reference images not displayed]

FINDINGS: Heart size is normal.  Mild hilar soft tissue prominence is seen, consistent with mild adenopathy seen on prior CT.  Both lungs are clear.  There is no evidence of pleural effusion.
IMPRESSION: 1.  Stable mild hilar soft tissue prominence, consistent with adenopathy seen on recent CT. 
2.  No acute findings.

## 2007-09-18 ENCOUNTER — Telehealth: Payer: Self-pay | Admitting: Internal Medicine

## 2007-10-09 ENCOUNTER — Telehealth (INDEPENDENT_AMBULATORY_CARE_PROVIDER_SITE_OTHER): Payer: Self-pay | Admitting: *Deleted

## 2007-10-17 ENCOUNTER — Ambulatory Visit: Payer: Self-pay | Admitting: Internal Medicine

## 2007-10-17 LAB — CONVERTED CEMR LAB
Blood in Urine, dipstick: NEGATIVE
Glucose, Urine, Semiquant: NEGATIVE
Nitrite: NEGATIVE
Protein, U semiquant: NEGATIVE
Specific Gravity, Urine: 1.025
Urobilinogen, UA: 0.2
pH: 5.5

## 2007-10-18 ENCOUNTER — Encounter: Payer: Self-pay | Admitting: Internal Medicine

## 2007-10-20 ENCOUNTER — Telehealth (INDEPENDENT_AMBULATORY_CARE_PROVIDER_SITE_OTHER): Payer: Self-pay | Admitting: *Deleted

## 2007-10-25 ENCOUNTER — Ambulatory Visit: Payer: Self-pay | Admitting: Critical Care Medicine

## 2007-10-27 ENCOUNTER — Ambulatory Visit: Payer: Self-pay | Admitting: Internal Medicine

## 2007-10-27 LAB — CONVERTED CEMR LAB
ALT: 29 units/L (ref 0–35)
AST: 23 units/L (ref 0–37)
Albumin: 4 g/dL (ref 3.5–5.2)
Alkaline Phosphatase: 66 units/L (ref 39–117)
Bilirubin, Direct: 0.2 mg/dL (ref 0.0–0.3)
Cholesterol: 197 mg/dL (ref 0–200)
Direct LDL: 106.6 mg/dL
HDL: 41.6 mg/dL (ref >39.0)
Total Bilirubin: 1 mg/dL (ref 0.3–1.2)
Total CHOL/HDL Ratio: 4.7
Total Protein: 6.6 g/dL (ref 6.0–8.3)
Triglycerides: 353 mg/dL (ref 0–149)
VLDL: 71 mg/dL — ABNORMAL HIGH (ref 0–40)

## 2007-11-03 ENCOUNTER — Ambulatory Visit: Payer: Self-pay | Admitting: Internal Medicine

## 2007-11-08 ENCOUNTER — Ambulatory Visit: Payer: Self-pay | Admitting: Internal Medicine

## 2007-11-13 ENCOUNTER — Telehealth: Payer: Self-pay | Admitting: Internal Medicine

## 2007-11-22 ENCOUNTER — Ambulatory Visit: Payer: Self-pay | Admitting: Internal Medicine

## 2007-11-22 LAB — CONVERTED CEMR LAB
BUN: 12 mg/dL (ref 6–23)
Creatinine, Ser: 0.8 mg/dL (ref 0.4–1.2)

## 2007-11-23 ENCOUNTER — Ambulatory Visit: Payer: Self-pay | Admitting: Cardiology

## 2007-11-24 ENCOUNTER — Ambulatory Visit: Payer: Self-pay | Admitting: Internal Medicine

## 2007-12-21 ENCOUNTER — Ambulatory Visit: Payer: Self-pay | Admitting: Internal Medicine

## 2007-12-22 ENCOUNTER — Encounter: Payer: Self-pay | Admitting: Internal Medicine

## 2007-12-22 ENCOUNTER — Telehealth: Payer: Self-pay | Admitting: Internal Medicine

## 2007-12-29 ENCOUNTER — Emergency Department (HOSPITAL_COMMUNITY): Admission: EM | Admit: 2007-12-29 | Discharge: 2007-12-29 | Payer: Self-pay | Admitting: Emergency Medicine

## 2008-01-16 ENCOUNTER — Ambulatory Visit: Payer: Self-pay | Admitting: Critical Care Medicine

## 2008-01-17 ENCOUNTER — Encounter: Payer: Self-pay | Admitting: Internal Medicine

## 2008-01-17 ENCOUNTER — Encounter: Payer: Self-pay | Admitting: Critical Care Medicine

## 2008-01-19 ENCOUNTER — Ambulatory Visit: Payer: Self-pay | Admitting: Vascular Surgery

## 2008-01-19 ENCOUNTER — Ambulatory Visit: Admission: RE | Admit: 2008-01-19 | Discharge: 2008-01-19 | Payer: Self-pay | Admitting: Family Medicine

## 2008-01-23 ENCOUNTER — Telehealth: Payer: Self-pay | Admitting: Internal Medicine

## 2008-01-25 ENCOUNTER — Ambulatory Visit: Payer: Self-pay | Admitting: Internal Medicine

## 2008-01-26 LAB — CONVERTED CEMR LAB
ALT: 24 units/L (ref 0–35)
AST: 22 units/L (ref 0–37)
Albumin: 3.8 g/dL (ref 3.5–5.2)
Alkaline Phosphatase: 84 units/L (ref 39–117)
BUN: 12 mg/dL (ref 6–23)
Basophils Absolute: 0.1 10*3/uL (ref 0.0–0.1)
Basophils Relative: 0.8 % (ref 0.0–1.0)
Bilirubin, Direct: 0.1 mg/dL (ref 0.0–0.3)
CO2: 37 meq/L — ABNORMAL HIGH (ref 19–32)
Calcium: 9.3 mg/dL (ref 8.4–10.5)
Chloride: 100 meq/L (ref 96–112)
Cholesterol: 170 mg/dL (ref 0–200)
Creatinine, Ser: 0.7 mg/dL (ref 0.4–1.2)
Eosinophils Absolute: 0.3 10*3/uL (ref 0.0–0.7)
Eosinophils Relative: 1.6 % (ref 0.0–5.0)
GFR calc Af Amer: 115 mL/min
GFR calc non Af Amer: 95 mL/min
Glucose, Bld: 97 mg/dL (ref 70–99)
HCT: 38.6 % (ref 36.0–46.0)
HDL: 45.5 mg/dL (ref >39.0)
Hemoglobin: 13.1 g/dL (ref 12.0–15.0)
Hgb A1c MFr Bld: 6.9 % — ABNORMAL HIGH (ref 4.6–6.0)
LDL Cholesterol: 85 mg/dL (ref 0–99)
Lymphocytes Relative: 31.9 % (ref 12.0–46.0)
MCHC: 33.8 g/dL (ref 30.0–36.0)
MCV: 93.4 fL (ref 78.0–100.0)
Monocytes Absolute: 0.9 10*3/uL (ref 0.1–1.0)
Monocytes Relative: 5.3 % (ref 3.0–12.0)
Neutro Abs: 10.8 10*3/uL — ABNORMAL HIGH (ref 1.4–7.7)
Neutrophils Relative %: 60.4 % (ref 43.0–77.0)
Platelets: 362 10*3/uL (ref 150–400)
Potassium: 3.3 meq/L — ABNORMAL LOW (ref 3.5–5.1)
RBC: 4.13 M/uL (ref 3.87–5.11)
RDW: 12.4 % (ref 11.5–14.6)
Sodium: 144 meq/L (ref 135–145)
Total Bilirubin: 0.9 mg/dL (ref 0.3–1.2)
Total CHOL/HDL Ratio: 3.7
Total Protein: 7.2 g/dL (ref 6.0–8.3)
Triglycerides: 199 mg/dL — ABNORMAL HIGH (ref 0–149)
VLDL: 40 mg/dL (ref 0–40)
WBC: 17.8 10*3/uL — ABNORMAL HIGH (ref 4.5–10.5)

## 2008-01-29 ENCOUNTER — Ambulatory Visit: Payer: Self-pay | Admitting: Internal Medicine

## 2008-01-29 LAB — CONVERTED CEMR LAB
Blood in Urine, dipstick: NEGATIVE
Glucose, Urine, Semiquant: NEGATIVE
Nitrite: NEGATIVE
Protein, U semiquant: NEGATIVE
Specific Gravity, Urine: 1.025
Urobilinogen, UA: 0.2
pH: 5

## 2008-02-05 ENCOUNTER — Telehealth: Payer: Self-pay | Admitting: Internal Medicine

## 2008-02-06 ENCOUNTER — Ambulatory Visit: Payer: Self-pay | Admitting: Internal Medicine

## 2008-02-08 IMAGING — CT CT CHEST W/ CM
2 of 3 series · 15 of 36 positions shown, 18 images · IV contrast (omnipaque)
Comparison: 09/09/06.

CLINICAL DATA: Mid sternal chest pain, cough, shortness of breath. Pulmonary nodules.  History of sarcoid.
CHEST CT WITH CONTRAST:
TECHNIQUE: Multidetector CT imaging of the chest was performed following the standard protocol during bolus administration of intravenous contrast.
Contrast:   80 cc Omnipaque 300.

[Series 2: chest_rout_xxl 5.0 b31f st · axial · 0.83mm/px · z∈[-282,-27]mm · 12 of 61 slices shown, 15 images]
[im 5/61  mediastinal]
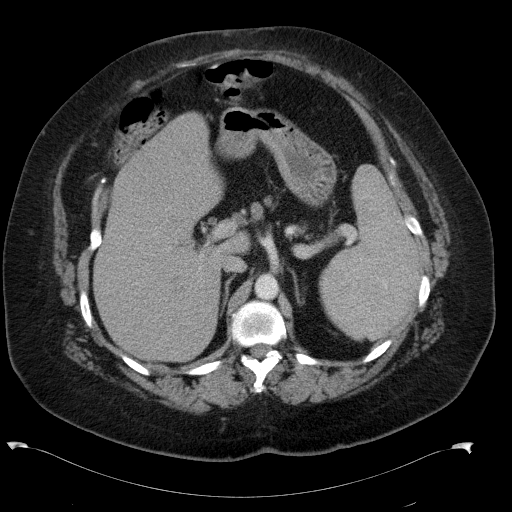
[im 5/61  lung]
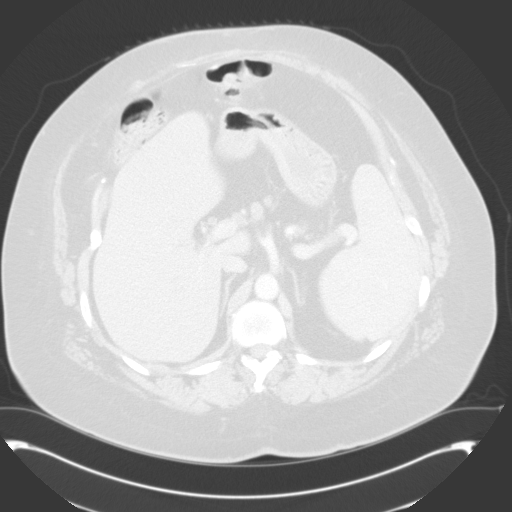
[im 9/61  lung]
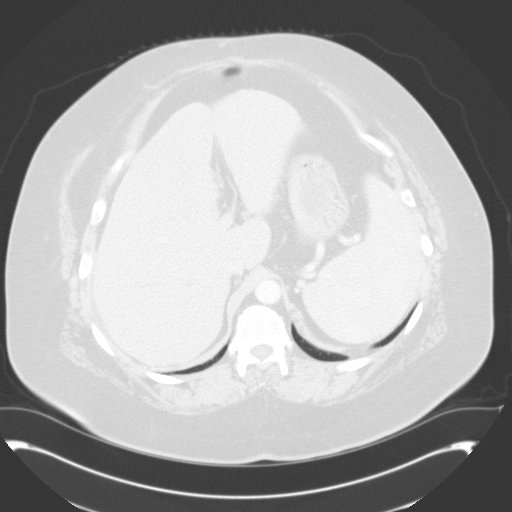
[im 14/61  lung]
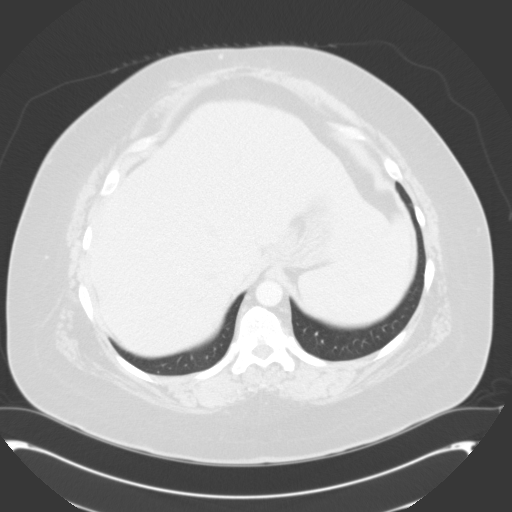
[im 18/61  lung]
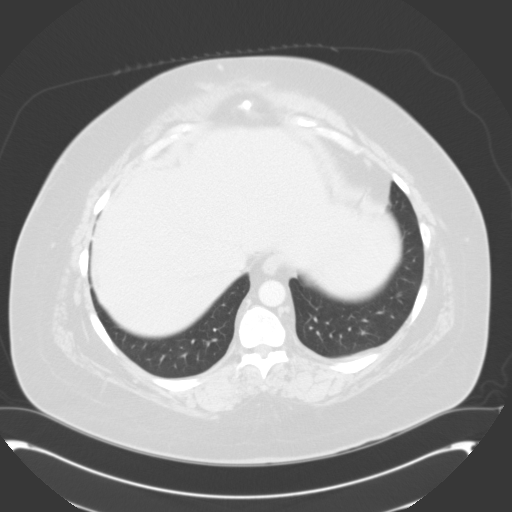
[im 23/61  mediastinal]
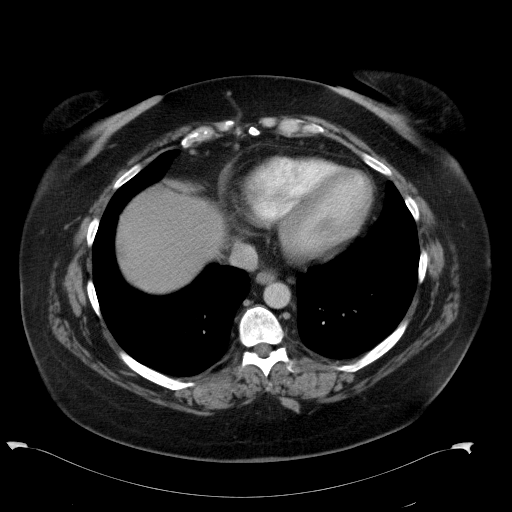
[im 23/61  lung]
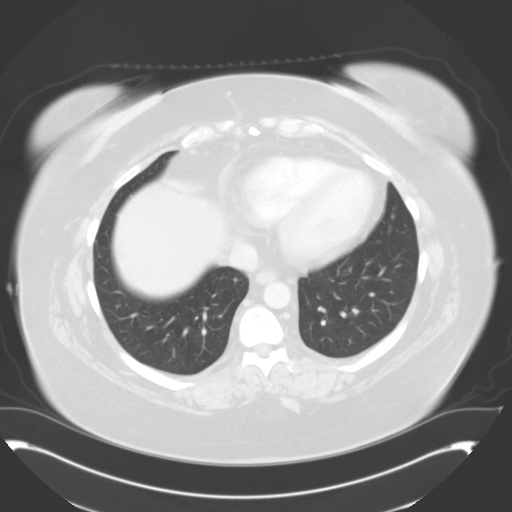
[im 27/61  lung]
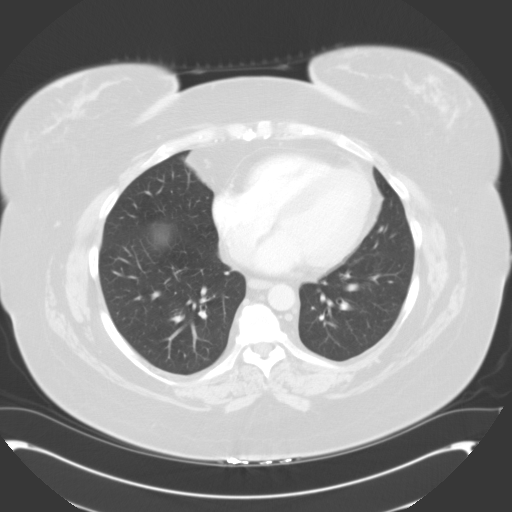
[im 34/61  lung]
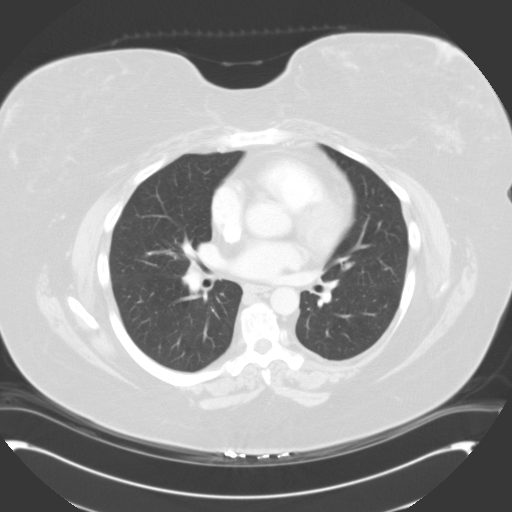
[im 38/61  lung]
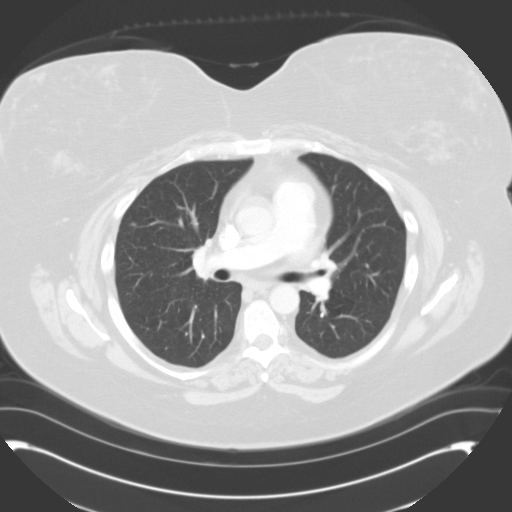
[im 43/61  mediastinal]
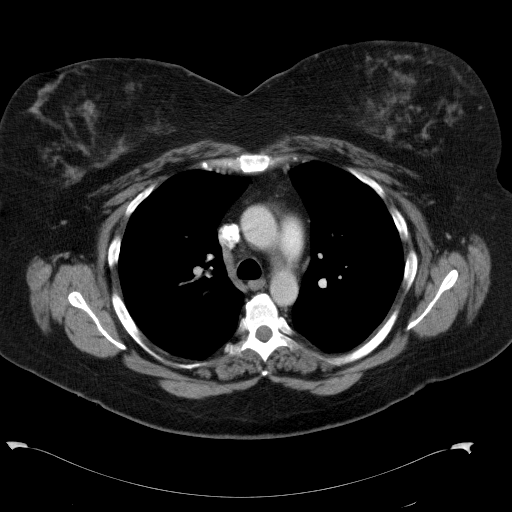
[im 43/61  lung]
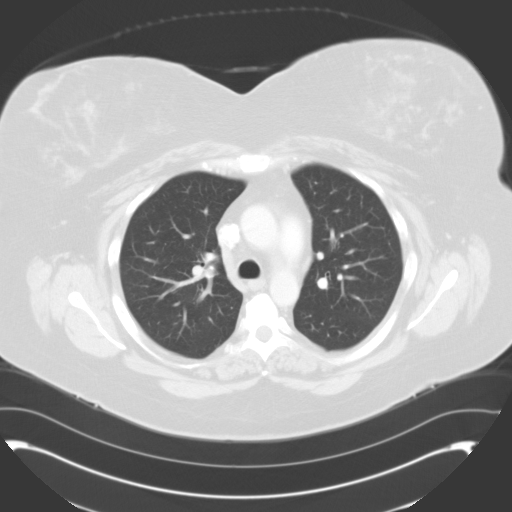
[im 47/61  lung]
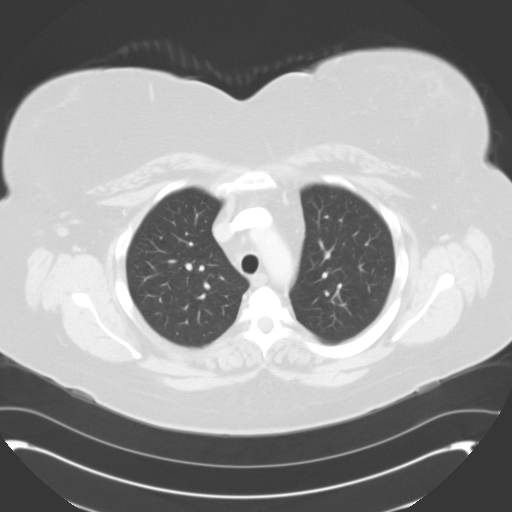
[im 52/61  lung]
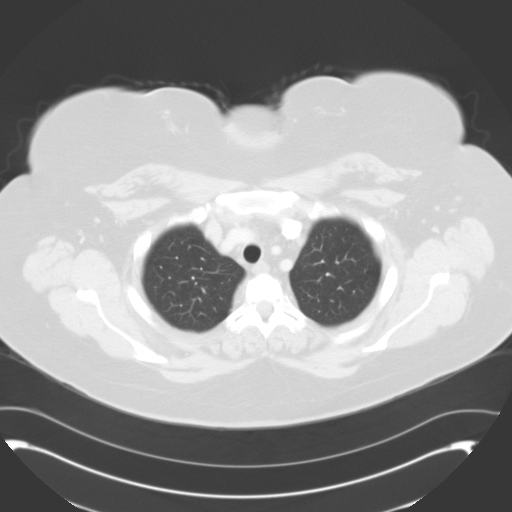
[im 56/61  lung]
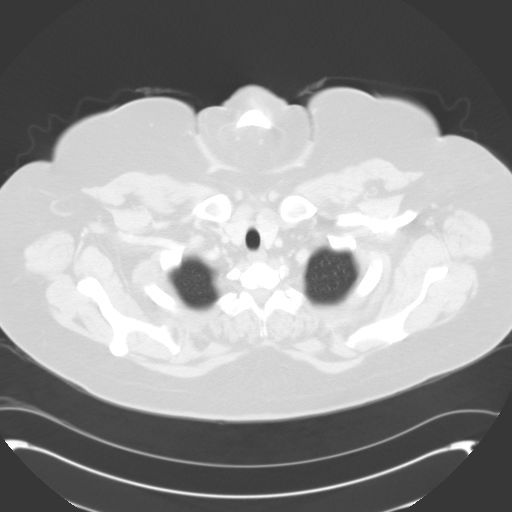

[Series 602: <mpr thick range> · coronal · 0.83mm/px · 3 of 99 slices shown]
[im 20/99  lung]
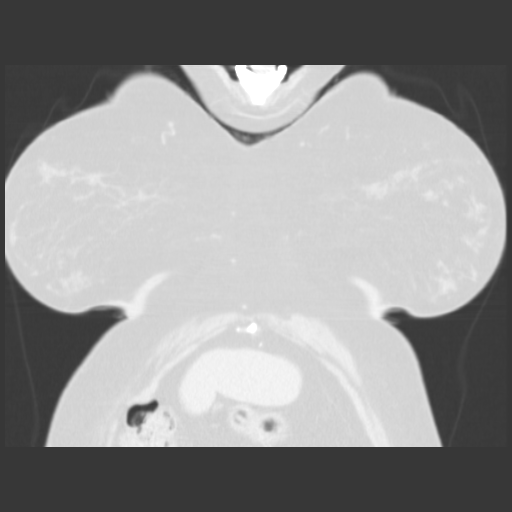
[im 40/99  lung]
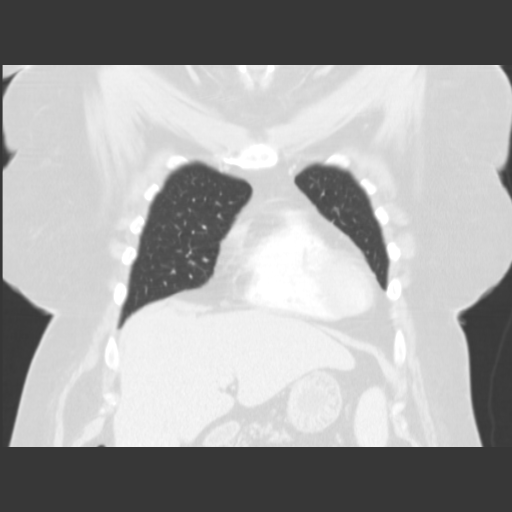
[im 59/99  lung]
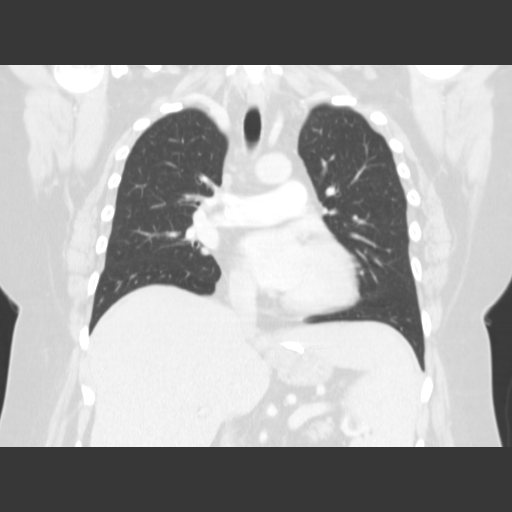

[15 of 36 positions shown; findings below may reference images not displayed]

FINDINGS: Mediastinal and hilar lymphadenopathy has resolved in the interval.  Heart size is stable.  Small pre-pericardiac and juxtadiaphragmatic lymph nodes. 
Scattered tiny pulmonary nodules have decreased in size or resolved in the interval.  Airway is unremarkable.  No pleural fluid. 
Incidental imaging of the upper abdomen shows a prominent spleen.  Gastrohepatic ligament lymph node measures 1.1 cm (previously 1.8 cm).  No worrisome lytic or sclerotic lesions.
IMPRESSION: 1. Interval resolution of mediastinal and hilar adenopathy.  Decrease in size of gastrohepatic ligament adenopathy.
2. Decrease in size or resolution of scattered small pulmonary nodules.

## 2008-02-12 ENCOUNTER — Ambulatory Visit: Payer: Self-pay | Admitting: Internal Medicine

## 2008-02-28 ENCOUNTER — Ambulatory Visit: Payer: Self-pay | Admitting: Internal Medicine

## 2008-02-28 LAB — CONVERTED CEMR LAB
BUN: 10 mg/dL (ref 6–23)
Bilirubin Urine: NEGATIVE
Blood in Urine, dipstick: NEGATIVE
CO2: 34 meq/L — ABNORMAL HIGH (ref 19–32)
Calcium: 9.3 mg/dL (ref 8.4–10.5)
Chloride: 98 meq/L (ref 96–112)
Creatinine, Ser: 0.8 mg/dL (ref 0.4–1.2)
GFR calc Af Amer: 98 mL/min
GFR calc non Af Amer: 81 mL/min
Glucose, Bld: 204 mg/dL — ABNORMAL HIGH (ref 70–99)
Glucose, Urine, Semiquant: NEGATIVE
Ketones, urine, test strip: NEGATIVE
Nitrite: NEGATIVE
Potassium: 3.4 meq/L — ABNORMAL LOW (ref 3.5–5.1)
Protein, U semiquant: NEGATIVE
Sodium: 140 meq/L (ref 135–145)
Specific Gravity, Urine: 1.01
Urobilinogen, UA: 0.2
pH: 5

## 2008-02-29 ENCOUNTER — Encounter: Payer: Self-pay | Admitting: Internal Medicine

## 2008-02-29 ENCOUNTER — Telehealth: Payer: Self-pay | Admitting: Internal Medicine

## 2008-03-05 ENCOUNTER — Telehealth: Payer: Self-pay | Admitting: Internal Medicine

## 2008-03-25 ENCOUNTER — Ambulatory Visit: Payer: Self-pay | Admitting: Internal Medicine

## 2008-03-27 ENCOUNTER — Ambulatory Visit: Payer: Self-pay | Admitting: Internal Medicine

## 2008-03-27 LAB — CONVERTED CEMR LAB
Bilirubin Urine: NEGATIVE
Ketones, urine, test strip: NEGATIVE
Nitrite: NEGATIVE
Protein, U semiquant: NEGATIVE
Specific Gravity, Urine: 1.01
Urobilinogen, UA: NEGATIVE
pH: 5.5

## 2008-03-28 ENCOUNTER — Encounter: Payer: Self-pay | Admitting: Internal Medicine

## 2008-04-18 ENCOUNTER — Ambulatory Visit: Payer: Self-pay | Admitting: Internal Medicine

## 2008-04-19 ENCOUNTER — Encounter: Payer: Self-pay | Admitting: Internal Medicine

## 2008-04-24 ENCOUNTER — Encounter: Admission: RE | Admit: 2008-04-24 | Discharge: 2008-04-24 | Payer: Self-pay | Admitting: Internal Medicine

## 2008-04-25 ENCOUNTER — Telehealth: Payer: Self-pay | Admitting: Internal Medicine

## 2008-04-26 ENCOUNTER — Ambulatory Visit: Payer: Self-pay | Admitting: Internal Medicine

## 2008-05-08 ENCOUNTER — Ambulatory Visit: Payer: Self-pay | Admitting: Internal Medicine

## 2008-05-08 ENCOUNTER — Telehealth: Payer: Self-pay | Admitting: Internal Medicine

## 2008-05-08 LAB — CONVERTED CEMR LAB
Bilirubin Urine: NEGATIVE
Blood in Urine, dipstick: NEGATIVE
Glucose, Urine, Semiquant: NEGATIVE
Ketones, urine, test strip: NEGATIVE
Nitrite: NEGATIVE
Protein, U semiquant: NEGATIVE
Specific Gravity, Urine: 1.015
Urobilinogen, UA: 0.2
WBC Urine, dipstick: NEGATIVE
pH: 7

## 2008-05-09 ENCOUNTER — Encounter: Payer: Self-pay | Admitting: Internal Medicine

## 2008-05-21 ENCOUNTER — Encounter: Payer: Self-pay | Admitting: Internal Medicine

## 2008-06-04 ENCOUNTER — Encounter: Admission: RE | Admit: 2008-06-04 | Discharge: 2008-06-04 | Payer: Self-pay

## 2008-06-05 ENCOUNTER — Ambulatory Visit: Payer: Self-pay | Admitting: Internal Medicine

## 2008-06-26 ENCOUNTER — Ambulatory Visit: Payer: Self-pay | Admitting: Critical Care Medicine

## 2008-06-26 LAB — CONVERTED CEMR LAB
Basophils Absolute: 0 10*3/uL (ref 0.0–0.1)
Basophils Relative: 0 % (ref 0.0–3.0)
Eosinophils Absolute: 0.3 10*3/uL (ref 0.0–0.7)
Eosinophils Relative: 1.3 % (ref 0.0–5.0)
HCT: 35.6 % — ABNORMAL LOW (ref 36.0–46.0)
Hemoglobin: 12.6 g/dL (ref 12.0–15.0)
Lymphocytes Relative: 18.8 % (ref 12.0–46.0)
MCHC: 35.5 g/dL (ref 30.0–36.0)
MCV: 95.6 fL (ref 78.0–100.0)
Monocytes Absolute: 0.6 10*3/uL (ref 0.1–1.0)
Monocytes Relative: 3 % (ref 3.0–12.0)
Neutro Abs: 14.9 10*3/uL — ABNORMAL HIGH (ref 1.4–7.7)
Neutrophils Relative %: 76.9 % (ref 43.0–77.0)
Platelets: 344 10*3/uL (ref 150–400)
RBC: 3.72 M/uL — ABNORMAL LOW (ref 3.87–5.11)
RDW: 12.8 % (ref 11.5–14.6)
WBC: 19.5 10*3/uL (ref 4.5–10.5)

## 2008-07-02 ENCOUNTER — Telehealth: Payer: Self-pay | Admitting: Critical Care Medicine

## 2008-07-04 ENCOUNTER — Ambulatory Visit: Payer: Self-pay | Admitting: Internal Medicine

## 2008-07-04 LAB — CONVERTED CEMR LAB
Bilirubin Urine: NEGATIVE
Blood in Urine, dipstick: NEGATIVE
Glucose, Urine, Semiquant: NEGATIVE
Ketones, urine, test strip: NEGATIVE
Nitrite: NEGATIVE
Protein, U semiquant: NEGATIVE
Specific Gravity, Urine: 1.01
Urobilinogen, UA: 0.2
pH: 6

## 2008-07-05 ENCOUNTER — Encounter: Payer: Self-pay | Admitting: Internal Medicine

## 2008-07-05 LAB — CONVERTED CEMR LAB
Basophils Absolute: 0.1 10*3/uL (ref 0.0–0.1)
Basophils Relative: 0.8 % (ref 0.0–3.0)
CK-MB: 1 ng/mL (ref 0.3–4.0)
Eosinophils Absolute: 0.3 10*3/uL (ref 0.0–0.7)
Eosinophils Relative: 1.5 % (ref 0.0–5.0)
HCT: 36.4 % (ref 36.0–46.0)
Hemoglobin: 12.8 g/dL (ref 12.0–15.0)
Lymphocytes Relative: 28.1 % (ref 12.0–46.0)
MCHC: 35.3 g/dL (ref 30.0–36.0)
MCV: 94.7 fL (ref 78.0–100.0)
Monocytes Absolute: 0.9 10*3/uL (ref 0.1–1.0)
Monocytes Relative: 5.2 % (ref 3.0–12.0)
Neutro Abs: 10.8 10*3/uL — ABNORMAL HIGH (ref 1.4–7.7)
Neutrophils Relative %: 64.4 % (ref 43.0–77.0)
Platelets: 348 10*3/uL (ref 150–400)
RBC: 3.84 M/uL — ABNORMAL LOW (ref 3.87–5.11)
RDW: 12.8 % (ref 11.5–14.6)
WBC: 16.8 10*3/uL — ABNORMAL HIGH (ref 4.5–10.5)

## 2008-07-09 ENCOUNTER — Telehealth: Payer: Self-pay | Admitting: Internal Medicine

## 2008-07-17 ENCOUNTER — Ambulatory Visit: Payer: Self-pay | Admitting: Internal Medicine

## 2008-07-18 ENCOUNTER — Telehealth: Payer: Self-pay | Admitting: Internal Medicine

## 2008-07-25 ENCOUNTER — Ambulatory Visit: Payer: Self-pay | Admitting: Internal Medicine

## 2008-07-25 LAB — CONVERTED CEMR LAB
Basophils Absolute: 0.1 10*3/uL (ref 0.0–0.1)
Basophils Relative: 0.6 % (ref 0.0–3.0)
Eosinophils Absolute: 0.3 10*3/uL (ref 0.0–0.7)
Eosinophils Relative: 1.8 % (ref 0.0–5.0)
HCT: 37.7 % (ref 36.0–46.0)
Hemoglobin: 13.1 g/dL (ref 12.0–15.0)
Lymphocytes Relative: 36.1 % (ref 12.0–46.0)
MCHC: 34.6 g/dL (ref 30.0–36.0)
MCV: 95.6 fL (ref 78.0–100.0)
Monocytes Absolute: 0.9 10*3/uL (ref 0.1–1.0)
Monocytes Relative: 5.2 % (ref 3.0–12.0)
Neutro Abs: 10.2 10*3/uL — ABNORMAL HIGH (ref 1.4–7.7)
Neutrophils Relative %: 56.3 % (ref 43.0–77.0)
Platelets: 355 10*3/uL (ref 150–400)
RBC: 3.94 M/uL (ref 3.87–5.11)
RDW: 12 % (ref 11.5–14.6)
WBC: 18 10*3/uL — ABNORMAL HIGH (ref 4.5–10.5)

## 2008-07-31 ENCOUNTER — Ambulatory Visit: Payer: Self-pay | Admitting: Internal Medicine

## 2008-08-05 ENCOUNTER — Telehealth: Payer: Self-pay | Admitting: Internal Medicine

## 2008-08-05 LAB — CONVERTED CEMR LAB
Basophils Absolute: 0.2 10*3/uL — ABNORMAL HIGH (ref 0.0–0.1)
Basophils Relative: 1.1 % (ref 0.0–3.0)
Eosinophils Absolute: 0.3 10*3/uL (ref 0.0–0.7)
Eosinophils Relative: 1.6 % (ref 0.0–5.0)
HCT: 36.5 % (ref 36.0–46.0)
Hemoglobin: 12.6 g/dL (ref 12.0–15.0)
Lymphocytes Relative: 38.8 % (ref 12.0–46.0)
MCHC: 34.6 g/dL (ref 30.0–36.0)
MCV: 95.4 fL (ref 78.0–100.0)
Monocytes Absolute: 1 10*3/uL (ref 0.1–1.0)
Monocytes Relative: 5.8 % (ref 3.0–12.0)
Neutro Abs: 9.2 10*3/uL — ABNORMAL HIGH (ref 1.4–7.7)
Neutrophils Relative %: 52.7 % (ref 43.0–77.0)
Platelets: 356 10*3/uL (ref 150–400)
RBC: 3.82 M/uL — ABNORMAL LOW (ref 3.87–5.11)
RDW: 12 % (ref 11.5–14.6)
WBC: 17.5 10*3/uL — ABNORMAL HIGH (ref 4.5–10.5)

## 2008-08-12 ENCOUNTER — Ambulatory Visit: Payer: Self-pay | Admitting: Internal Medicine

## 2008-08-13 ENCOUNTER — Encounter: Payer: Self-pay | Admitting: Internal Medicine

## 2008-08-28 ENCOUNTER — Encounter: Payer: Self-pay | Admitting: Internal Medicine

## 2008-08-29 ENCOUNTER — Encounter: Payer: Self-pay | Admitting: Critical Care Medicine

## 2008-09-09 ENCOUNTER — Telehealth: Payer: Self-pay | Admitting: Critical Care Medicine

## 2008-09-24 ENCOUNTER — Ambulatory Visit: Payer: Self-pay | Admitting: Critical Care Medicine

## 2008-09-30 ENCOUNTER — Ambulatory Visit: Payer: Self-pay | Admitting: Internal Medicine

## 2008-09-30 LAB — CONVERTED CEMR LAB
Bilirubin Urine: NEGATIVE
Blood in Urine, dipstick: NEGATIVE
Glucose, Urine, Semiquant: NEGATIVE
Ketones, urine, test strip: NEGATIVE
Nitrite: NEGATIVE
Protein, U semiquant: NEGATIVE
Specific Gravity, Urine: 1.025
Urobilinogen, UA: 0.2
WBC Urine, dipstick: NEGATIVE
pH: 5.5

## 2008-10-01 ENCOUNTER — Encounter: Payer: Self-pay | Admitting: Internal Medicine

## 2008-10-03 LAB — CONVERTED CEMR LAB
ALT: 24 units/L (ref 0–35)
AST: 24 units/L (ref 0–37)
BUN: 14 mg/dL (ref 6–23)
CO2: 32 meq/L (ref 19–32)
Calcium: 9.6 mg/dL (ref 8.4–10.5)
Chloride: 101 meq/L (ref 96–112)
Cholesterol: 255 mg/dL (ref 0–200)
Creatinine, Ser: 0.8 mg/dL (ref 0.4–1.2)
Direct LDL: 157.5 mg/dL
GFR calc Af Amer: 98 mL/min
GFR calc non Af Amer: 81 mL/min
Glucose, Bld: 138 mg/dL — ABNORMAL HIGH (ref 70–99)
HDL: 43.5 mg/dL (ref >39.0)
Hgb A1c MFr Bld: 6.7 % — ABNORMAL HIGH (ref 4.6–6.0)
Potassium: 3.4 meq/L — ABNORMAL LOW (ref 3.5–5.1)
Sodium: 141 meq/L (ref 135–145)
Total CHOL/HDL Ratio: 5.9
Triglycerides: 376 mg/dL (ref 0–149)
VLDL: 75 mg/dL — ABNORMAL HIGH (ref 0–40)

## 2008-10-10 ENCOUNTER — Encounter: Admission: RE | Admit: 2008-10-10 | Discharge: 2008-10-10 | Payer: Self-pay | Admitting: Orthopedic Surgery

## 2008-10-22 ENCOUNTER — Encounter: Payer: Self-pay | Admitting: Internal Medicine

## 2008-10-30 ENCOUNTER — Ambulatory Visit: Payer: Self-pay | Admitting: Internal Medicine

## 2008-11-11 ENCOUNTER — Encounter: Admission: RE | Admit: 2008-11-11 | Discharge: 2008-11-11 | Payer: Self-pay | Admitting: Orthopedic Surgery

## 2008-11-12 ENCOUNTER — Ambulatory Visit (HOSPITAL_BASED_OUTPATIENT_CLINIC_OR_DEPARTMENT_OTHER): Admission: RE | Admit: 2008-11-12 | Discharge: 2008-11-13 | Payer: Self-pay | Admitting: Orthopedic Surgery

## 2008-11-18 ENCOUNTER — Encounter: Payer: Self-pay | Admitting: Internal Medicine

## 2008-11-22 ENCOUNTER — Telehealth (INDEPENDENT_AMBULATORY_CARE_PROVIDER_SITE_OTHER): Payer: Self-pay | Admitting: *Deleted

## 2008-12-02 ENCOUNTER — Ambulatory Visit: Payer: Self-pay

## 2008-12-18 ENCOUNTER — Ambulatory Visit: Payer: Self-pay | Admitting: Critical Care Medicine

## 2008-12-24 ENCOUNTER — Ambulatory Visit: Payer: Self-pay | Admitting: Internal Medicine

## 2008-12-25 ENCOUNTER — Encounter: Payer: Self-pay | Admitting: Internal Medicine

## 2008-12-26 LAB — CONVERTED CEMR LAB
ALT: 14 units/L (ref 0–35)
AST: 19 units/L (ref 0–37)
Albumin: 3.7 g/dL (ref 3.5–5.2)
Alkaline Phosphatase: 82 units/L (ref 39–117)
BUN: 11 mg/dL (ref 6–23)
Bilirubin, Direct: 0.2 mg/dL (ref 0.0–0.3)
CO2: 27 meq/L (ref 19–32)
Calcium: 9.4 mg/dL (ref 8.4–10.5)
Chloride: 101 meq/L (ref 96–112)
Cholesterol: 150 mg/dL (ref 0–200)
Creatinine, Ser: 0.6 mg/dL (ref 0.4–1.2)
Direct LDL: 72.2 mg/dL
GFR calc non Af Amer: 112.75 mL/min (ref >60)
Glucose, Bld: 142 mg/dL — ABNORMAL HIGH (ref 70–99)
HDL: 36.1 mg/dL — ABNORMAL LOW (ref >39.00)
Hgb A1c MFr Bld: 5.9 % (ref 4.6–6.5)
Potassium: 3.2 meq/L — ABNORMAL LOW (ref 3.5–5.1)
Sodium: 140 meq/L (ref 135–145)
Total Bilirubin: 1 mg/dL (ref 0.3–1.2)
Total CHOL/HDL Ratio: 4
Total Protein: 7.2 g/dL (ref 6.0–8.3)
Triglycerides: 352 mg/dL — ABNORMAL HIGH (ref 0.0–149.0)
VLDL: 70.4 mg/dL — ABNORMAL HIGH (ref 0.0–40.0)

## 2008-12-30 ENCOUNTER — Telehealth: Payer: Self-pay | Admitting: Internal Medicine

## 2008-12-31 ENCOUNTER — Ambulatory Visit: Payer: Self-pay | Admitting: Internal Medicine

## 2009-01-01 ENCOUNTER — Telehealth: Payer: Self-pay | Admitting: Internal Medicine

## 2009-01-02 ENCOUNTER — Emergency Department (HOSPITAL_COMMUNITY): Admission: EM | Admit: 2009-01-02 | Discharge: 2009-01-02 | Payer: Self-pay | Admitting: Emergency Medicine

## 2009-01-23 ENCOUNTER — Encounter: Admission: RE | Admit: 2009-01-23 | Discharge: 2009-01-23 | Payer: Self-pay | Admitting: Orthopedic Surgery

## 2009-01-29 ENCOUNTER — Encounter: Payer: Self-pay | Admitting: Internal Medicine

## 2009-03-11 ENCOUNTER — Encounter: Payer: Self-pay | Admitting: Internal Medicine

## 2009-03-18 ENCOUNTER — Ambulatory Visit: Payer: Self-pay | Admitting: Internal Medicine

## 2009-03-19 ENCOUNTER — Encounter: Payer: Self-pay | Admitting: Internal Medicine

## 2009-03-26 ENCOUNTER — Ambulatory Visit: Payer: Self-pay | Admitting: Internal Medicine

## 2009-04-08 ENCOUNTER — Ambulatory Visit: Payer: Self-pay | Admitting: Critical Care Medicine

## 2009-04-08 ENCOUNTER — Ambulatory Visit (HOSPITAL_COMMUNITY): Admission: RE | Admit: 2009-04-08 | Discharge: 2009-04-08 | Payer: Self-pay | Admitting: Critical Care Medicine

## 2009-04-14 IMAGING — MR MR HEAD WO/W CM
8 of 10 series · 26 of 48 positions shown · IV contrast (multihance)
Comparison: No comparison MR brain.

CLINICAL DATA: Headaches.  Question neurosarcoidosis?

MRI HEAD WITHOUT AND WITH CONTRAST
TECHNIQUE: Multiplanar, multiecho pulse sequences of the brain and
surrounding structures were obtained according to standard protocol
without and with intravenous contrast
Contrast: 20 ml MultiHance.

[Series 2: t1_se_sag · sagittal · 5.0mm · 0.45mm/px · 3 of 19 slices shown]
[im 1/19]
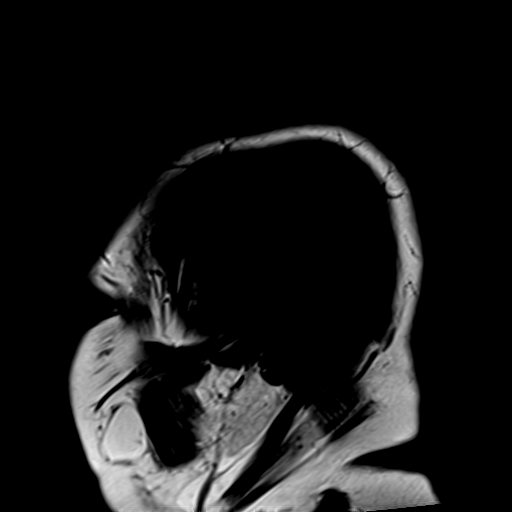
[im 10/19]
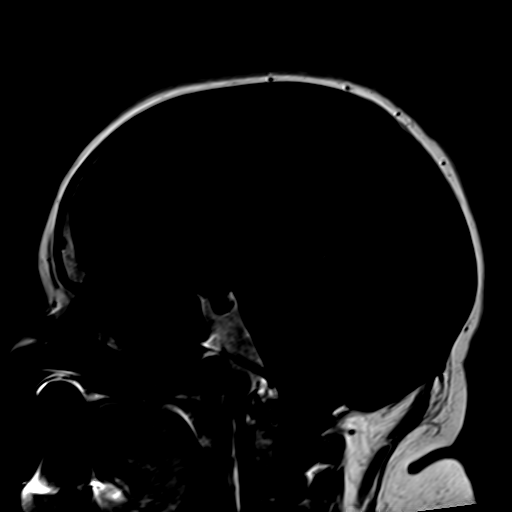
[im 19/19]
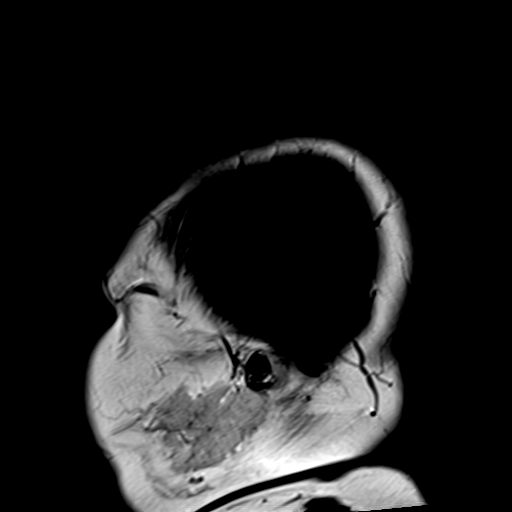

[Series 3: ep2d_diff_(id)_trace · axial · 5.0mm · 1.80mm/px · z∈[-76,+73]mm · 7 of 48 slices shown]
[im 1/48]
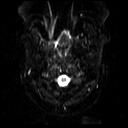
[im 8/48]
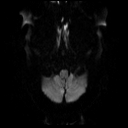
[im 16/48]
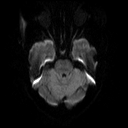
[im 24/48]
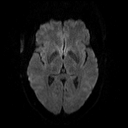
[im 32/48]
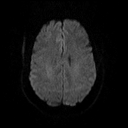
[im 40/48]
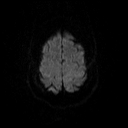
[im 48/48]
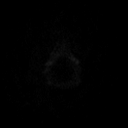

[Series 4: ep2d_diff_(id)_trace_adc · axial · 5.0mm · 1.80mm/px · z∈[-76,+73]mm · 3 of 24 slices shown]
[im 1/24]
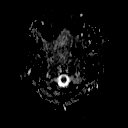
[im 12/24]
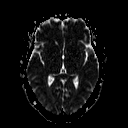
[im 24/24]
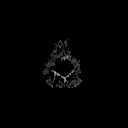

[Series 5: t2_tse_tra_512 · axial · 5.0mm · 0.30mm/px · z∈[-76,+73]mm · 3 of 24 slices shown]
[im 1/24]
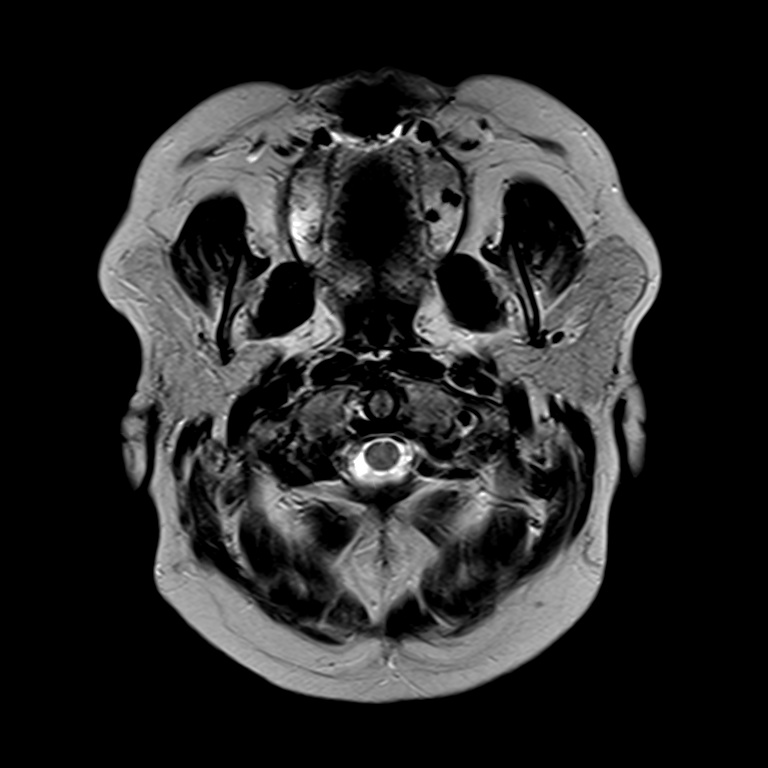
[im 12/24]
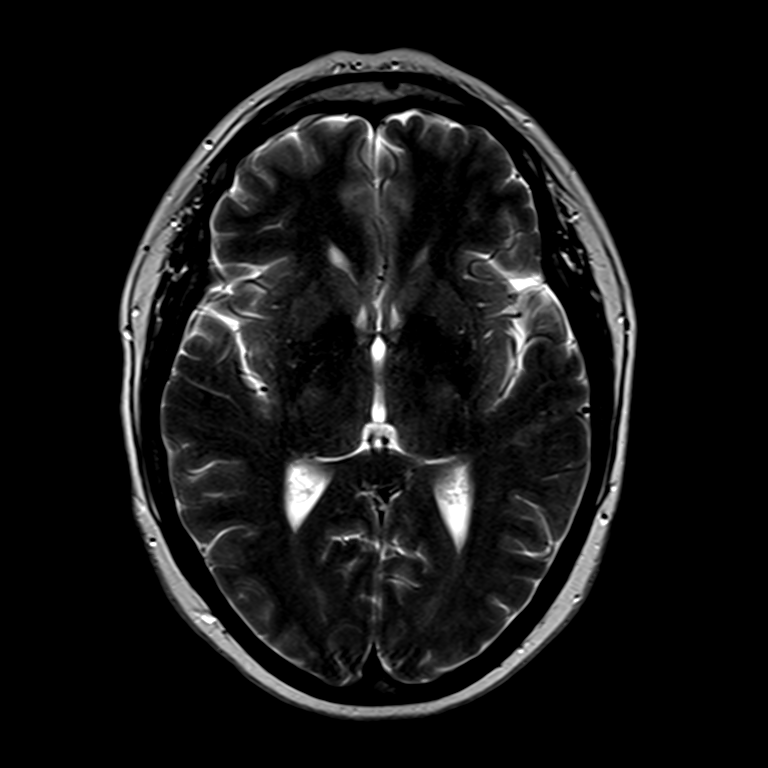
[im 24/24]
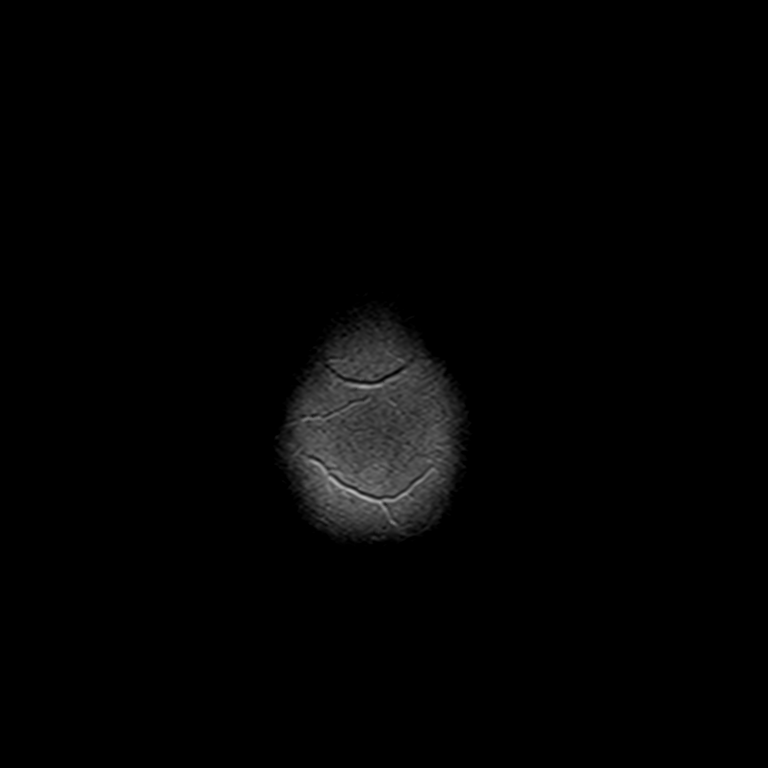

[Series 6: FLAIR · axial · 5.0mm · 0.45mm/px · z∈[-76,+73]mm · 3 of 24 slices shown]
[im 1/24]
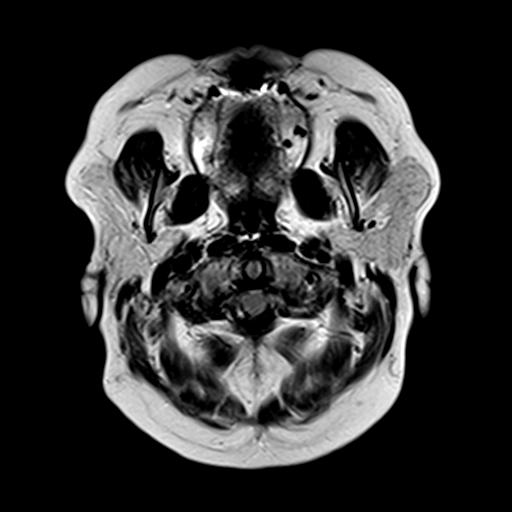
[im 12/24]
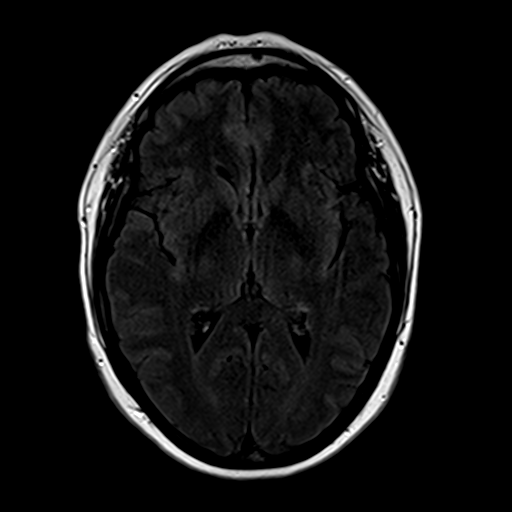
[im 24/24]
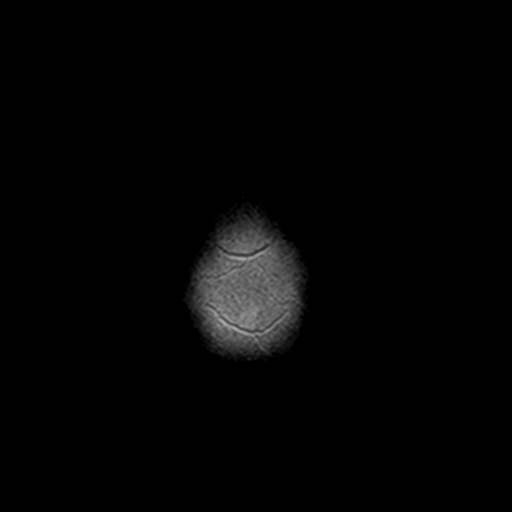

[Series 7: t1_mpr_tra · axial · 2.0mm · 0.45mm/px · 1 of 72 slices shown]
[im 1/72]
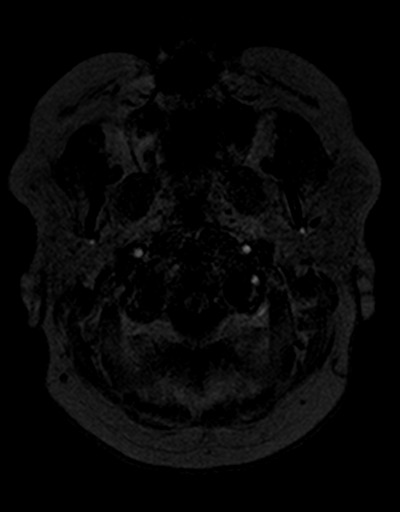

[Series 8: GRE · axial · 5.0mm · 0.45mm/px · z∈[-76,+73]mm · 3 of 24 slices shown]
[im 1/24]
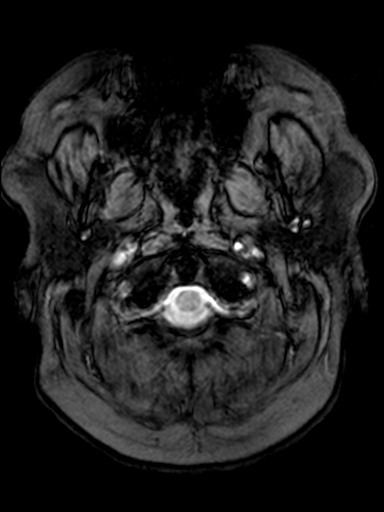
[im 12/24]
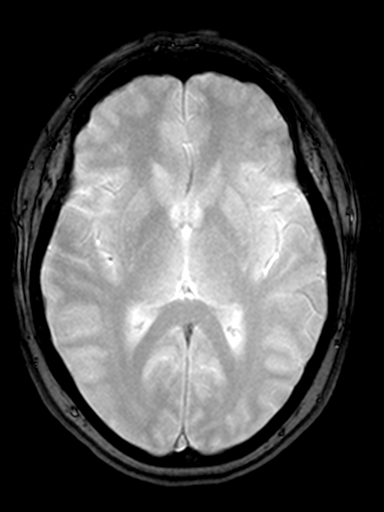
[im 24/24]
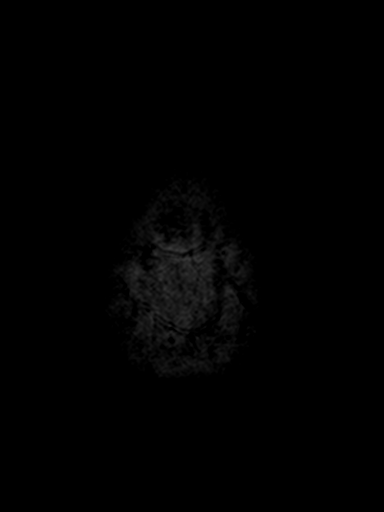

[Series 9: T2 · coronal · non-contrast · 5.0mm · 0.45mm/px · 3 of 22 slices shown]
[im 1/22]
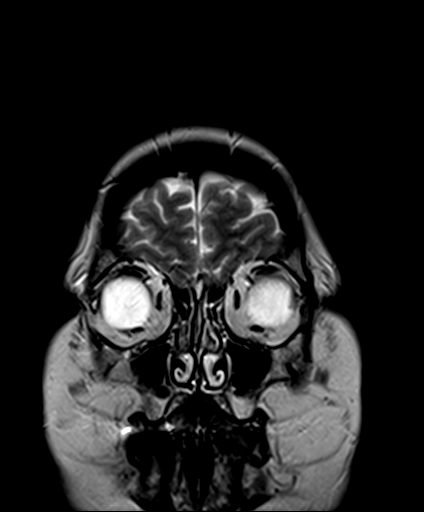
[im 11/22]
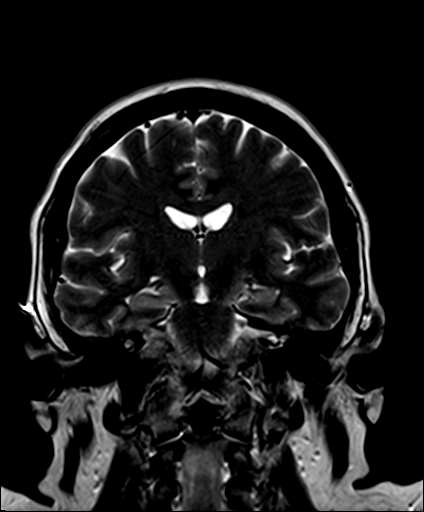
[im 22/22]
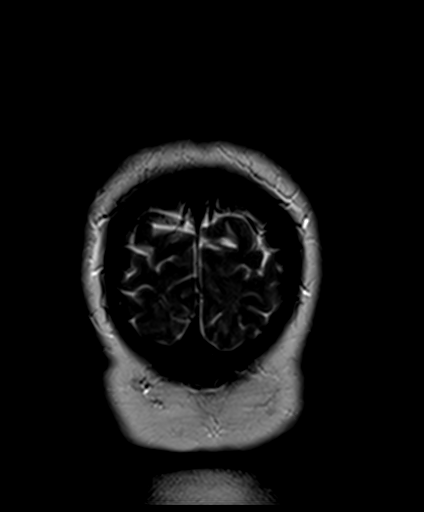

[26 of 48 positions shown; findings below may reference images not displayed]

FINDINGS: No acute infarct. No hydrocephalus.  No intracranial
hemorrhage.  No abnormal intracranial enhancing lesion.  Minimal
nonspecific white matter type changes right frontal lobe.  Although
this can be seen with sarcoidosis, this is minimal in size and has
been described in several other settings include that related to;
migraine headaches, small vessel disease, vasculitis or
demyelinating process.

Heterogeneous appearance of bone marrow.  This may be related to
the patient's habitus however, correlation with CBC and
differential recommend to exclude underlying anemia or infiltrative
contributing to this appearance.

The cerebellar tonsils are minimally low-lying but within range
normal limits. Pituitary region and pineal region unremarkable.
Orbital structures appear intact.  Minimal paranasal sinus mucosal
thickening. Major intracranial vascular structures are patent.
IMPRESSION: Minimal nonspecific white matter type changes right frontal lobe
otherwise no findings to suggest neurosarcoidosis.  See above.

Heterogeneous appearance of bone marrow.  Follow up as noted above.

## 2009-04-21 ENCOUNTER — Ambulatory Visit: Payer: Self-pay | Admitting: Internal Medicine

## 2009-04-21 DIAGNOSIS — G4730 Sleep apnea, unspecified: Secondary | ICD-10-CM | POA: Insufficient documentation

## 2009-04-22 ENCOUNTER — Encounter: Payer: Self-pay | Admitting: Internal Medicine

## 2009-04-22 LAB — CONVERTED CEMR LAB
ALT: 21 units/L (ref 0–35)
BUN: 12 mg/dL (ref 6–23)
CO2: 34 meq/L — ABNORMAL HIGH (ref 19–32)
Calcium: 9.2 mg/dL (ref 8.4–10.5)
Chloride: 98 meq/L (ref 96–112)
Cholesterol: 158 mg/dL (ref 0–200)
Creatinine, Ser: 0.7 mg/dL (ref 0.4–1.2)
Direct LDL: 83.9 mg/dL
GFR calc non Af Amer: 94.25 mL/min (ref >60)
Glucose, Bld: 184 mg/dL — ABNORMAL HIGH (ref 70–99)
HDL: 39.9 mg/dL (ref >39.00)
Hgb A1c MFr Bld: 6.5 % (ref 4.6–6.5)
Potassium: 3.3 meq/L — ABNORMAL LOW (ref 3.5–5.1)
Sodium: 142 meq/L (ref 135–145)
Total CHOL/HDL Ratio: 4
Total CK: 40 units/L (ref 7–177)
Triglycerides: 319 mg/dL — ABNORMAL HIGH (ref 0.0–149.0)
VLDL: 63.8 mg/dL — ABNORMAL HIGH (ref 0.0–40.0)

## 2009-04-23 ENCOUNTER — Telehealth (INDEPENDENT_AMBULATORY_CARE_PROVIDER_SITE_OTHER): Payer: Self-pay | Admitting: *Deleted

## 2009-04-24 ENCOUNTER — Telehealth (INDEPENDENT_AMBULATORY_CARE_PROVIDER_SITE_OTHER): Payer: Self-pay | Admitting: *Deleted

## 2009-04-27 ENCOUNTER — Ambulatory Visit: Payer: Self-pay | Admitting: Internal Medicine

## 2009-04-28 ENCOUNTER — Inpatient Hospital Stay (HOSPITAL_COMMUNITY): Admission: EM | Admit: 2009-04-28 | Discharge: 2009-04-30 | Payer: Self-pay | Admitting: Emergency Medicine

## 2009-04-28 ENCOUNTER — Encounter (INDEPENDENT_AMBULATORY_CARE_PROVIDER_SITE_OTHER): Payer: Self-pay | Admitting: *Deleted

## 2009-04-30 ENCOUNTER — Telehealth (INDEPENDENT_AMBULATORY_CARE_PROVIDER_SITE_OTHER): Payer: Self-pay | Admitting: *Deleted

## 2009-05-01 ENCOUNTER — Ambulatory Visit: Payer: Self-pay | Admitting: Adult Health

## 2009-05-01 ENCOUNTER — Telehealth (INDEPENDENT_AMBULATORY_CARE_PROVIDER_SITE_OTHER): Payer: Self-pay | Admitting: *Deleted

## 2009-05-07 ENCOUNTER — Ambulatory Visit: Payer: Self-pay | Admitting: Critical Care Medicine

## 2009-05-08 ENCOUNTER — Ambulatory Visit: Payer: Self-pay | Admitting: Internal Medicine

## 2009-05-09 ENCOUNTER — Telehealth (INDEPENDENT_AMBULATORY_CARE_PROVIDER_SITE_OTHER): Payer: Self-pay | Admitting: *Deleted

## 2009-05-09 LAB — CONVERTED CEMR LAB
ALT: 19 units/L (ref 0–35)
AST: 24 units/L (ref 0–37)
Albumin: 4.1 g/dL (ref 3.5–5.2)
Alkaline Phosphatase: 69 units/L (ref 39–117)
BUN: 14 mg/dL (ref 6–23)
Bilirubin, Direct: 0.1 mg/dL (ref 0.0–0.3)
CO2: 31 meq/L (ref 19–32)
Calcium: 9.1 mg/dL (ref 8.4–10.5)
Chloride: 99 meq/L (ref 96–112)
Cholesterol: 184 mg/dL (ref 0–200)
Creatinine, Ser: 0.7 mg/dL (ref 0.4–1.2)
Direct LDL: 100.1 mg/dL
GFR calc non Af Amer: 94.23 mL/min (ref >60)
Glucose, Bld: 131 mg/dL — ABNORMAL HIGH (ref 70–99)
HDL: 45.8 mg/dL (ref >39.00)
Hgb A1c MFr Bld: 6.5 % (ref 4.6–6.5)
Potassium: 3.2 meq/L — ABNORMAL LOW (ref 3.5–5.1)
Sodium: 140 meq/L (ref 135–145)
Total Bilirubin: 0.9 mg/dL (ref 0.3–1.2)
Total CHOL/HDL Ratio: 4
Total Protein: 7.6 g/dL (ref 6.0–8.3)
Triglycerides: 383 mg/dL — ABNORMAL HIGH (ref 0.0–149.0)
VLDL: 76.6 mg/dL — ABNORMAL HIGH (ref 0.0–40.0)

## 2009-05-12 ENCOUNTER — Ambulatory Visit: Payer: Self-pay | Admitting: Internal Medicine

## 2009-05-27 IMAGING — CR DG HIP (WITH OR WITHOUT PELVIS) 2-3V*L*
3 series · 3 of 3 positions shown · non-contrast
Comparison: None

CLINICAL DATA: Left leg and hip pain.

LEFT HIP - COMPLETE 2+ VIEW

[view not recorded (1 of 3)]
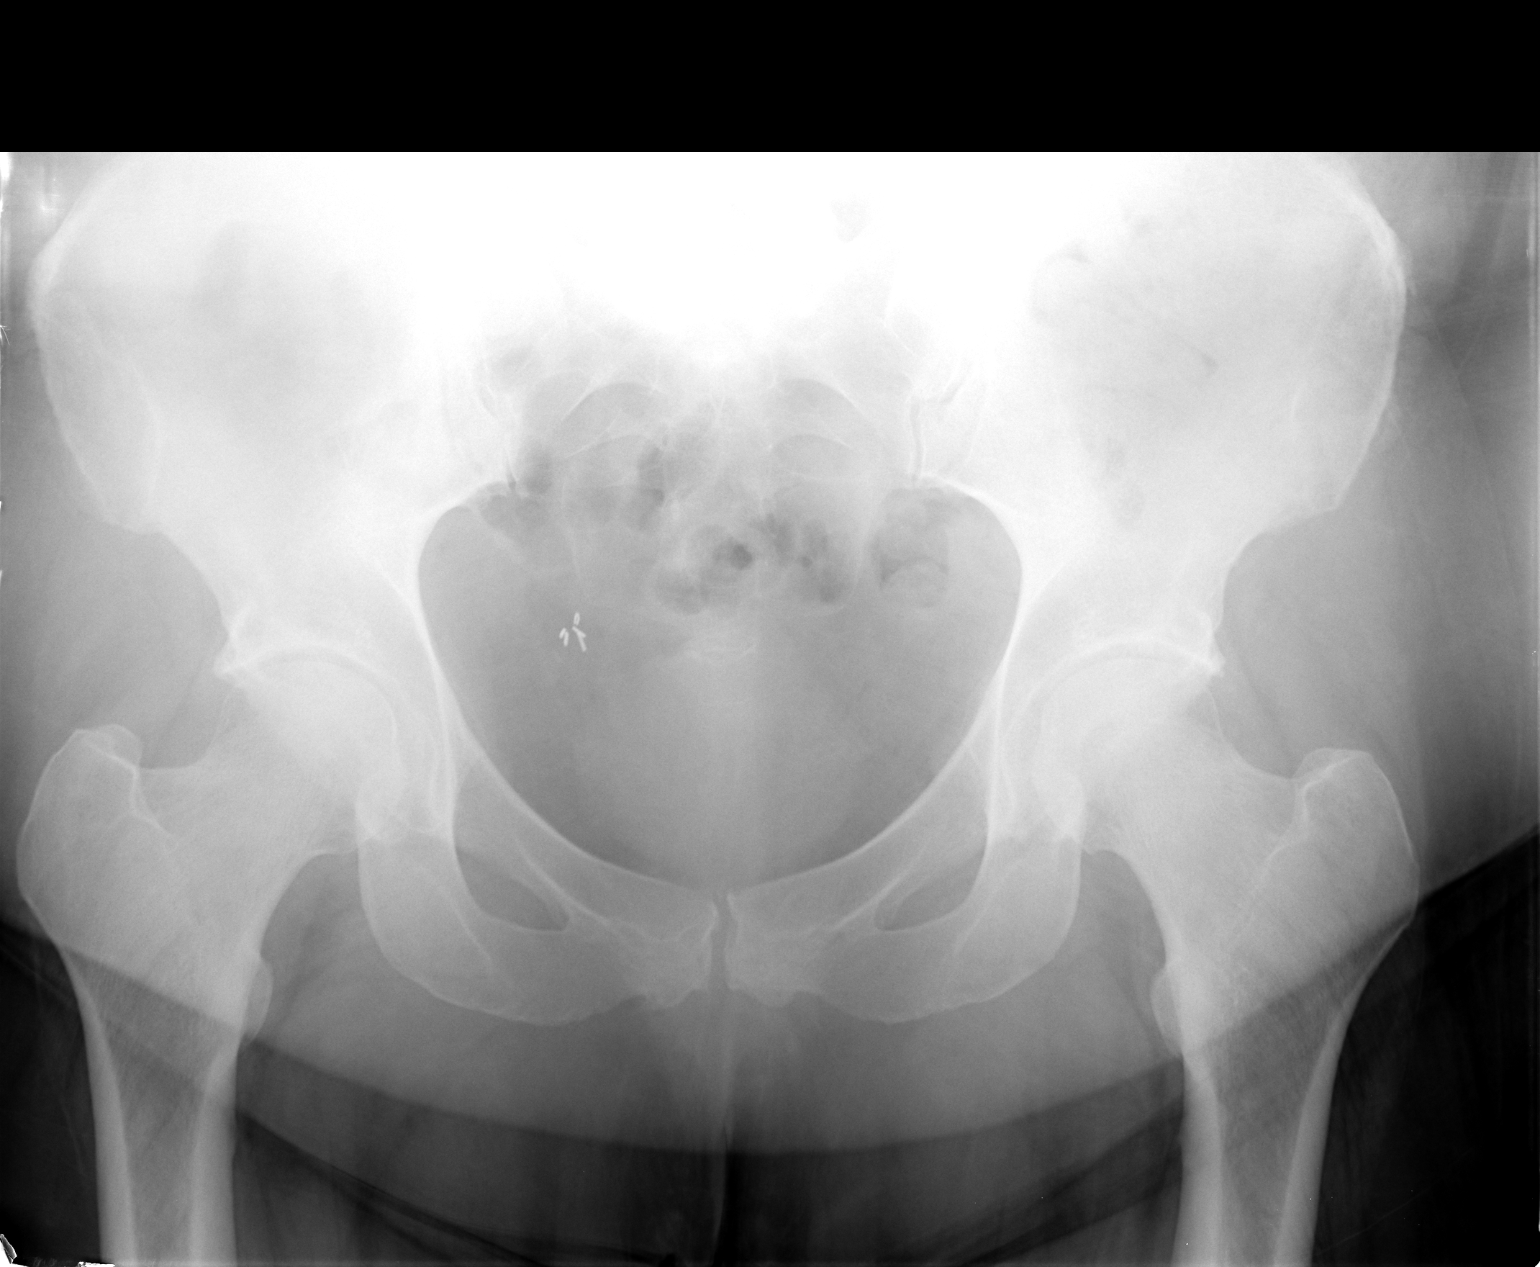

[view not recorded (2 of 3)]
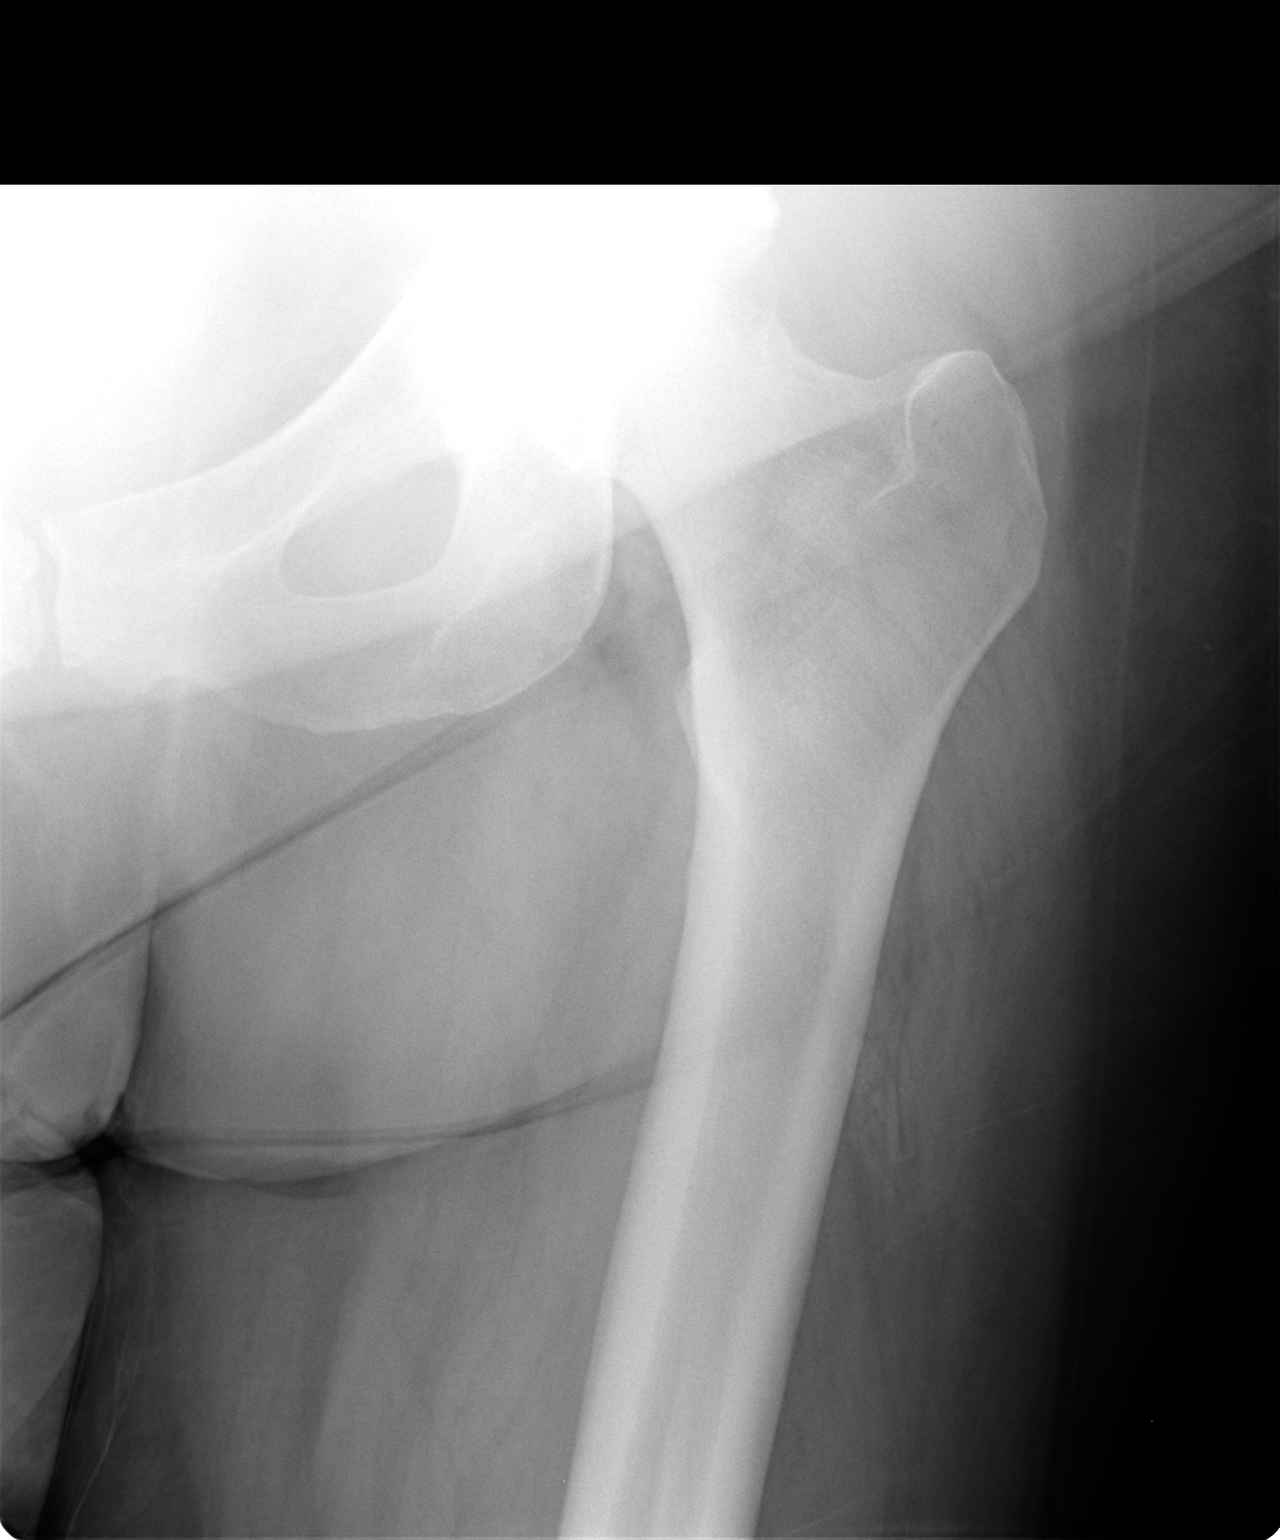

[view not recorded (3 of 3)]
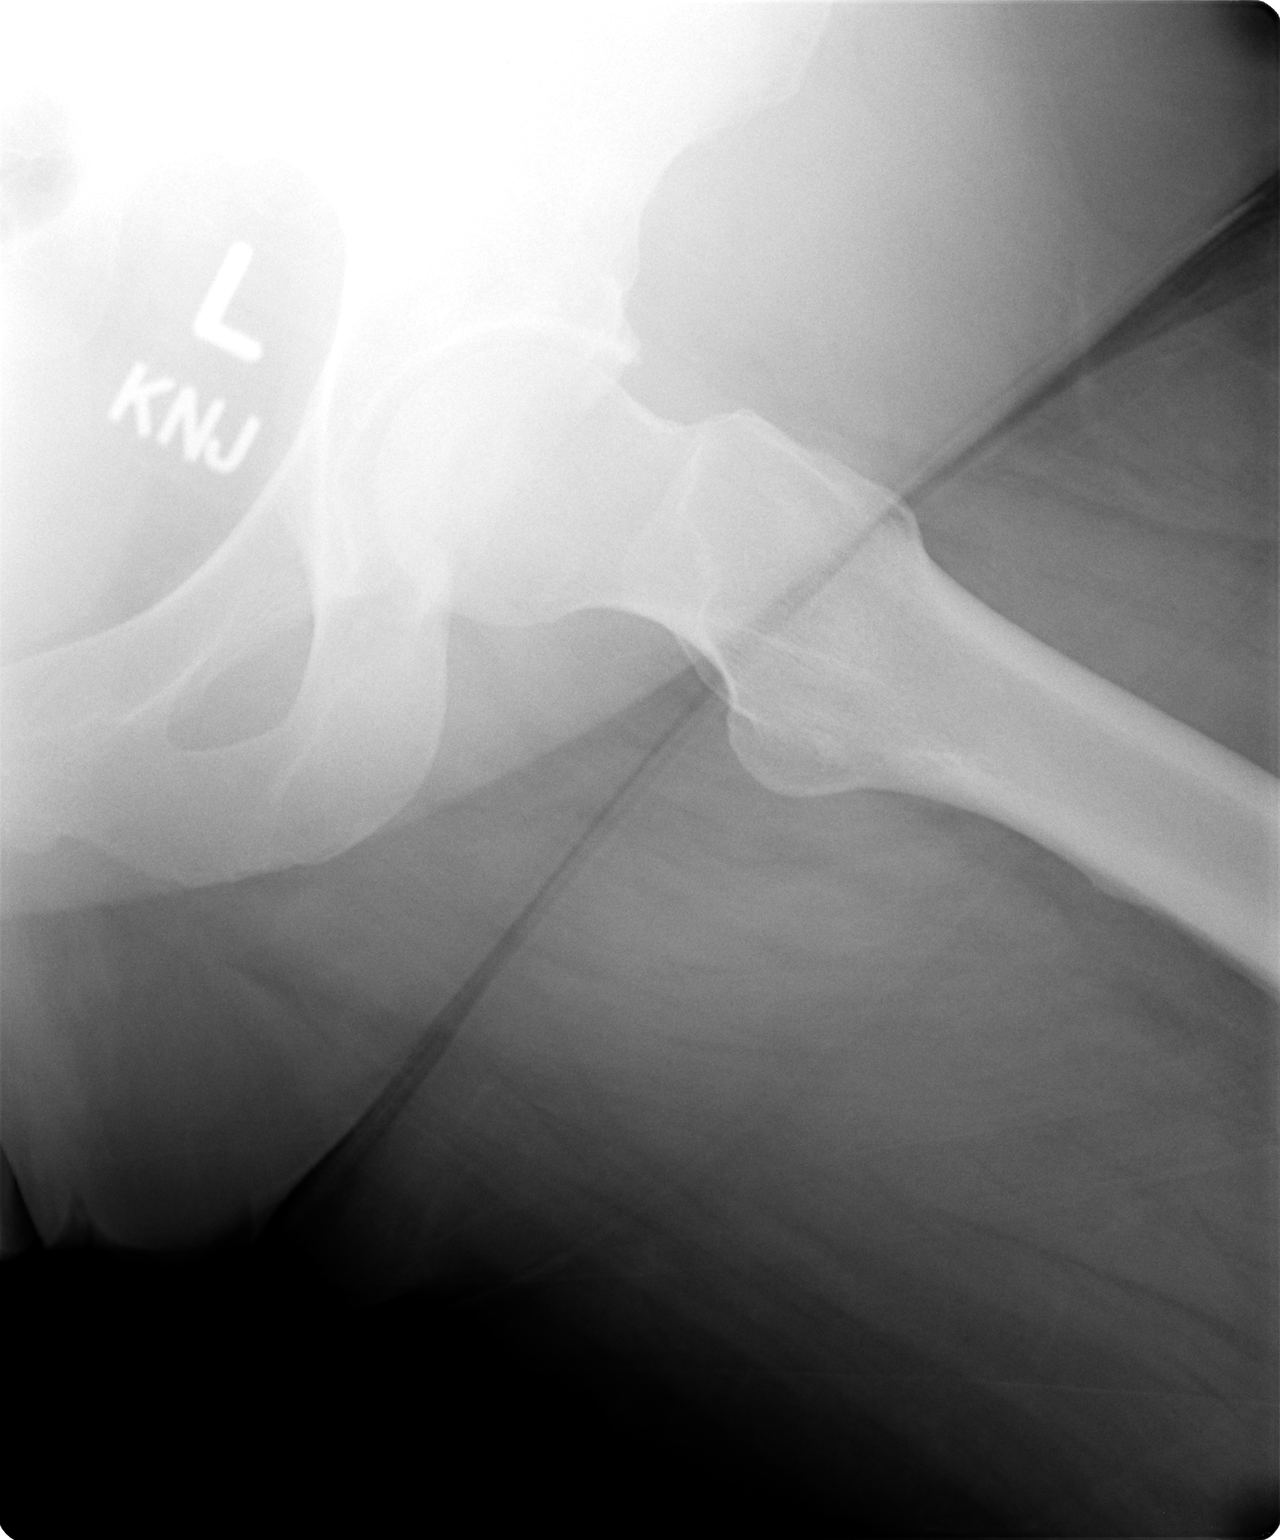

[3 of 3 positions shown; findings below may reference images not displayed]

FINDINGS: No fracture or dislocation.  There is mild joint space
narrowing bilaterally, with subchondral sclerosis and
osteophytosis.
IMPRESSION: Mild and symmetric bilateral hip osteoarthritis.

## 2009-05-27 IMAGING — CR DG KNEE COMPLETE 4+V*L*
4 series · 4 of 4 positions shown · non-contrast
Comparison: None

CLINICAL DATA: Left leg pain.

LEFT KNEE - COMPLETE 4+ VIEW

[view not recorded (1 of 4)]
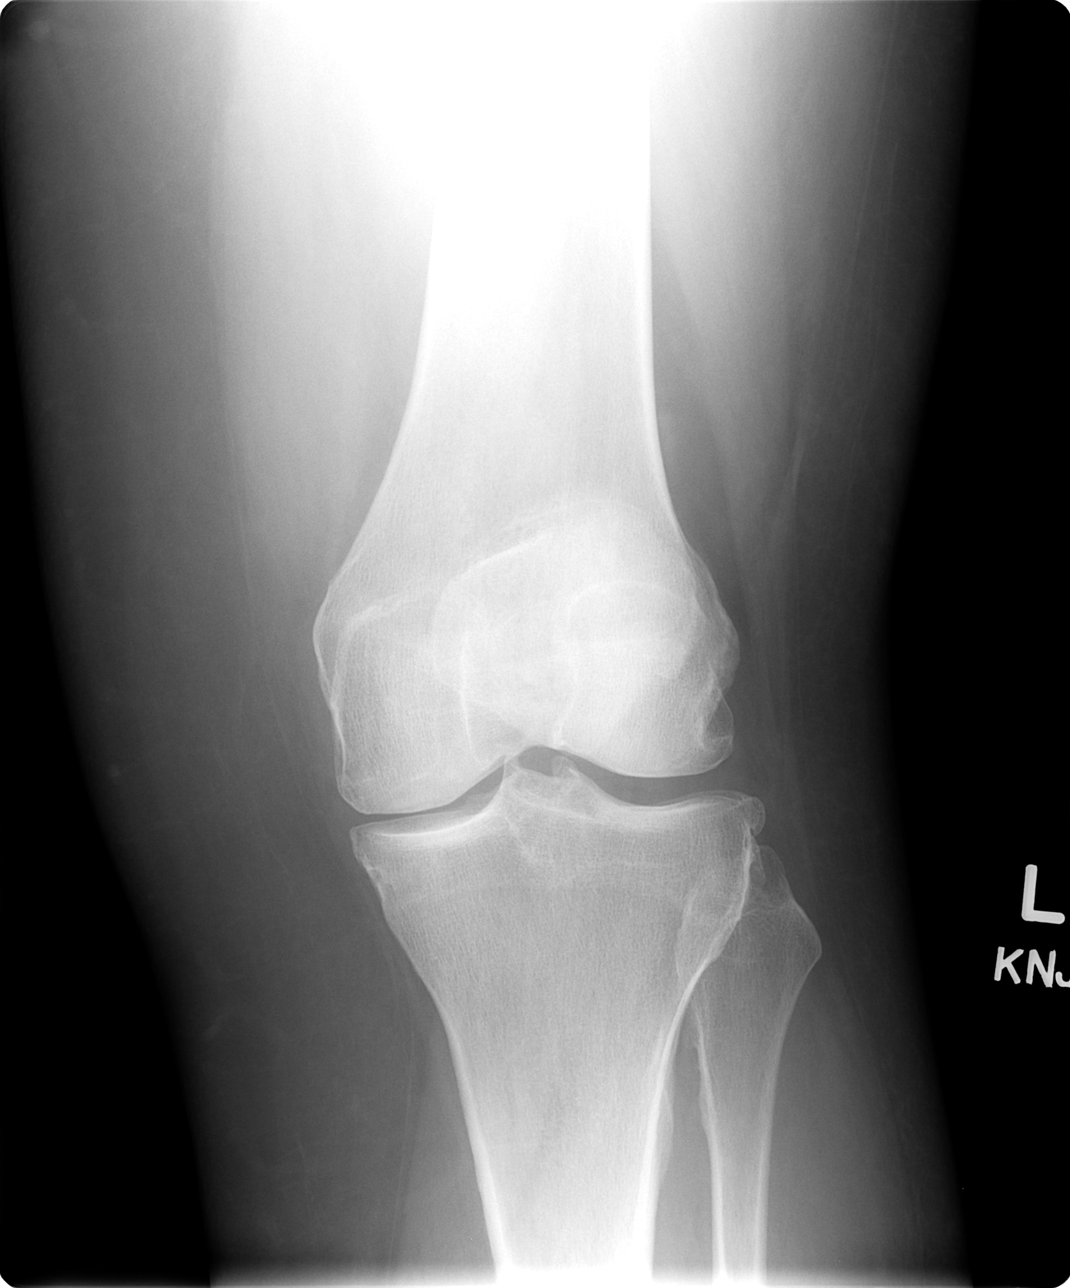

[view not recorded (2 of 4)]
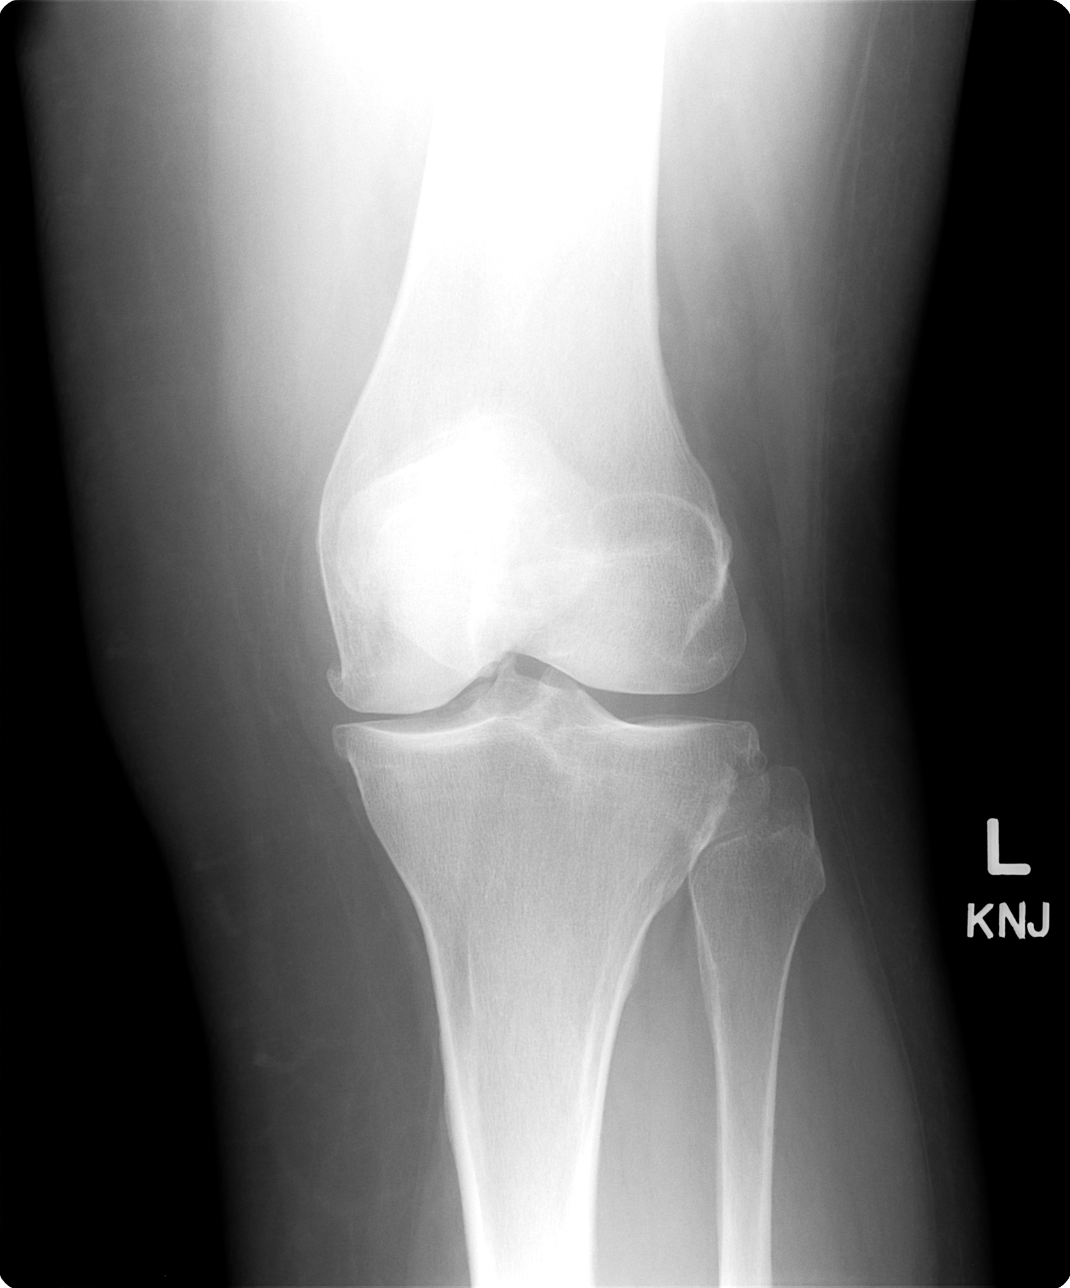

[view not recorded (3 of 4)]
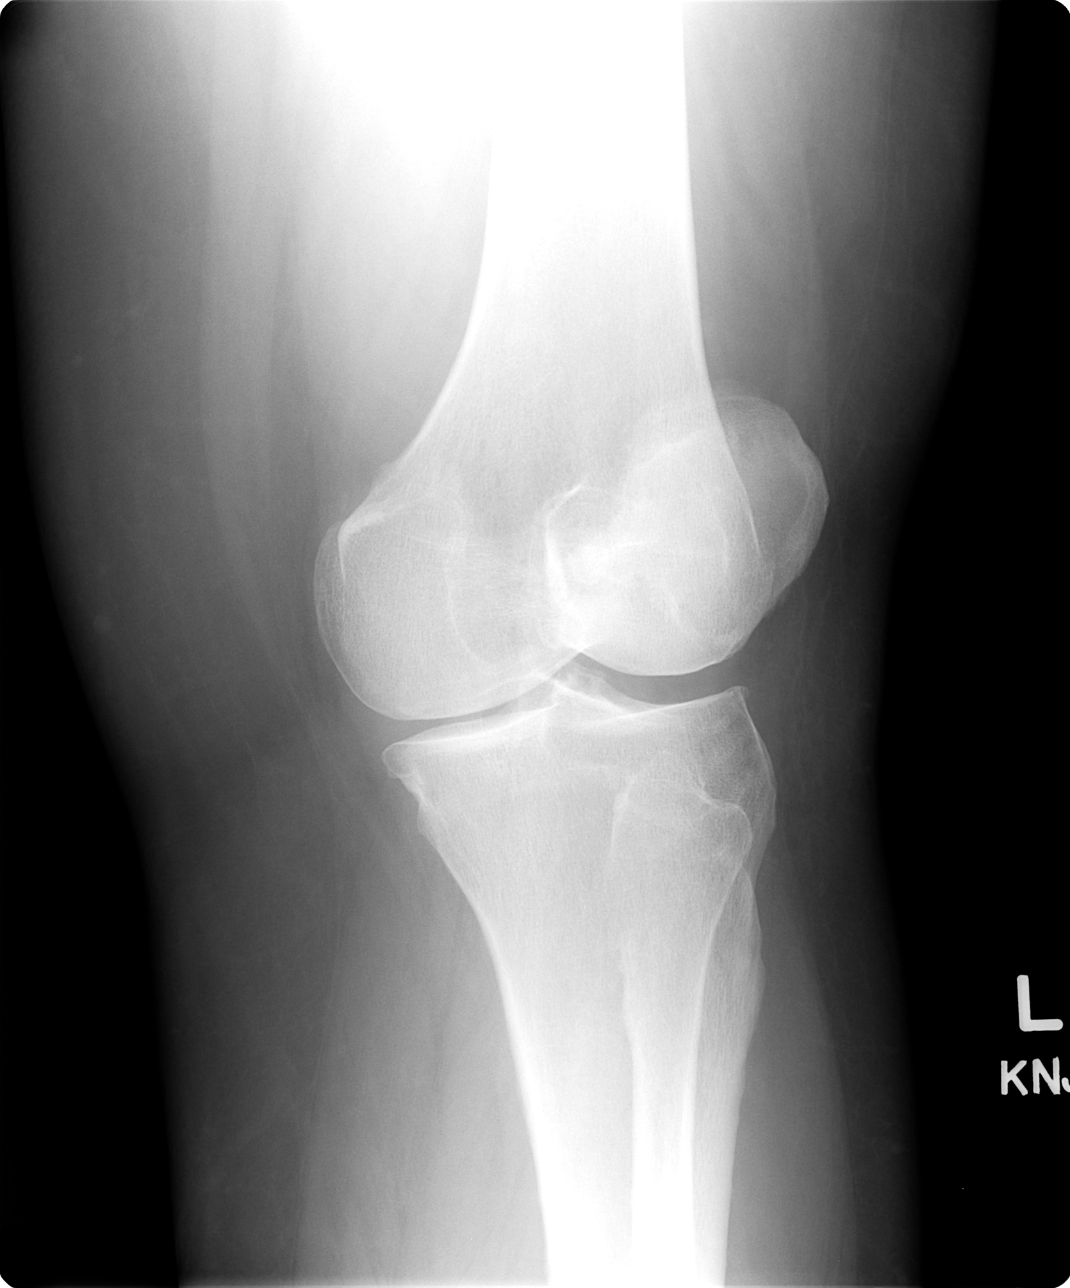

[view not recorded (4 of 4)]
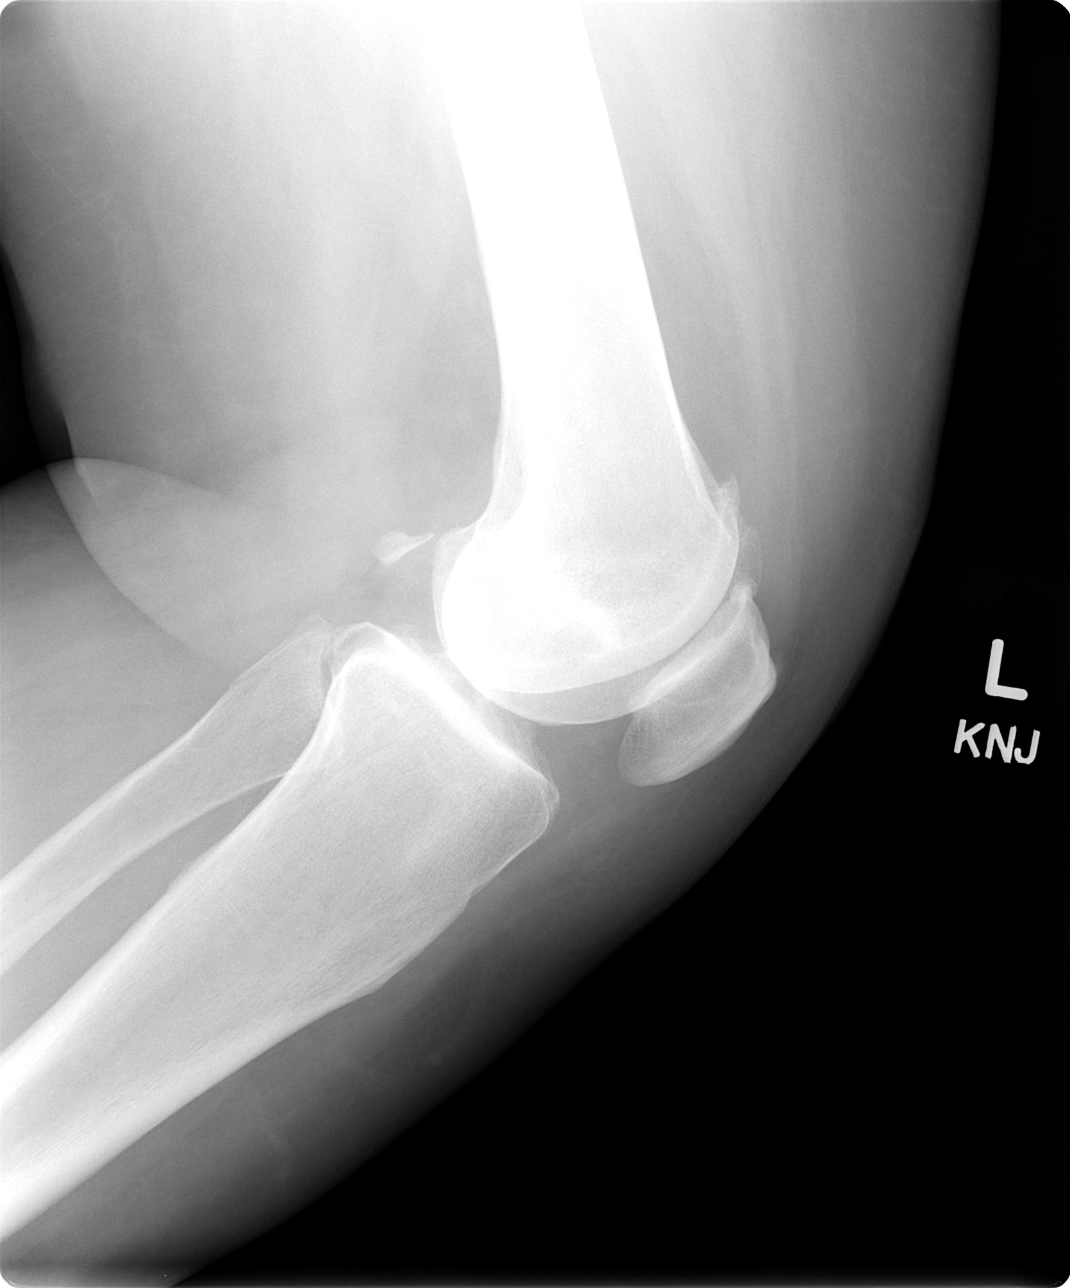

[4 of 4 positions shown; findings below may reference images not displayed]

FINDINGS: No joint effusion or fracture.  Mild tricompartment
osteophytosis with peaking of the tibial spines.  There may be
medial joint space narrowing.
IMPRESSION: Osteoarthritis without acute finding.

## 2009-05-30 ENCOUNTER — Ambulatory Visit: Payer: Self-pay | Admitting: Critical Care Medicine

## 2009-05-30 ENCOUNTER — Ambulatory Visit: Payer: Self-pay | Admitting: Internal Medicine

## 2009-05-30 LAB — CONVERTED CEMR LAB
BUN: 13 mg/dL (ref 6–23)
CO2: 33 meq/L — ABNORMAL HIGH (ref 19–32)
Calcium: 8.8 mg/dL (ref 8.4–10.5)
Chloride: 106 meq/L (ref 96–112)
Creatinine, Ser: 0.7 mg/dL (ref 0.4–1.2)
GFR calc non Af Amer: 94.21 mL/min (ref >60)
Glucose, Bld: 131 mg/dL — ABNORMAL HIGH (ref 70–99)
Potassium: 3.7 meq/L (ref 3.5–5.1)
Sodium: 145 meq/L (ref 135–145)

## 2009-06-03 ENCOUNTER — Encounter: Payer: Self-pay | Admitting: Internal Medicine

## 2009-06-03 ENCOUNTER — Ambulatory Visit: Payer: Self-pay | Admitting: Internal Medicine

## 2009-06-04 ENCOUNTER — Ambulatory Visit: Payer: Self-pay | Admitting: Internal Medicine

## 2009-06-04 DIAGNOSIS — E669 Obesity, unspecified: Secondary | ICD-10-CM

## 2009-06-04 HISTORY — DX: Obesity, unspecified: E66.9

## 2009-06-04 LAB — CONVERTED CEMR LAB
Bilirubin Urine: NEGATIVE
Blood in Urine, dipstick: NEGATIVE
Glucose, Urine, Semiquant: NEGATIVE
Ketones, urine, test strip: NEGATIVE
Nitrite: NEGATIVE
Protein, U semiquant: NEGATIVE
Specific Gravity, Urine: 1.025
Urobilinogen, UA: 0.2
pH: 5.5

## 2009-06-05 ENCOUNTER — Encounter: Payer: Self-pay | Admitting: Internal Medicine

## 2009-06-05 ENCOUNTER — Telehealth (INDEPENDENT_AMBULATORY_CARE_PROVIDER_SITE_OTHER): Payer: Self-pay | Admitting: *Deleted

## 2009-06-10 ENCOUNTER — Ambulatory Visit: Payer: Self-pay | Admitting: Critical Care Medicine

## 2009-06-25 ENCOUNTER — Ambulatory Visit: Payer: Self-pay | Admitting: Internal Medicine

## 2009-06-26 ENCOUNTER — Encounter: Payer: Self-pay | Admitting: Internal Medicine

## 2009-06-26 LAB — CONVERTED CEMR LAB
ALT: 19 units/L (ref 0–35)
AST: 21 units/L (ref 0–37)
Albumin: 3.6 g/dL (ref 3.5–5.2)
Alkaline Phosphatase: 64 units/L (ref 39–117)
BUN: 17 mg/dL (ref 6–23)
Basophils Absolute: 0.1 10*3/uL (ref 0.0–0.1)
Basophils Relative: 0.4 % (ref 0.0–3.0)
Bilirubin, Direct: 0 mg/dL (ref 0.0–0.3)
CO2: 32 meq/L (ref 19–32)
Calcium: 8.8 mg/dL (ref 8.4–10.5)
Chloride: 101 meq/L (ref 96–112)
Creatinine, Ser: 0.7 mg/dL (ref 0.4–1.2)
Eosinophils Absolute: 0.2 10*3/uL (ref 0.0–0.7)
Eosinophils Relative: 1.2 % (ref 0.0–5.0)
GFR calc non Af Amer: 94.18 mL/min (ref >60)
Glucose, Bld: 168 mg/dL — ABNORMAL HIGH (ref 70–99)
HCT: 37.2 % (ref 36.0–46.0)
Hemoglobin: 12.7 g/dL (ref 12.0–15.0)
Lymphocytes Relative: 21.5 % (ref 12.0–46.0)
Lymphs Abs: 3.5 10*3/uL (ref 0.7–4.0)
MCHC: 34 g/dL (ref 30.0–36.0)
MCV: 96.5 fL (ref 78.0–100.0)
Monocytes Absolute: 0.9 10*3/uL (ref 0.1–1.0)
Monocytes Relative: 5.6 % (ref 3.0–12.0)
Neutro Abs: 11.6 10*3/uL — ABNORMAL HIGH (ref 1.4–7.7)
Neutrophils Relative %: 71.3 % (ref 43.0–77.0)
Platelets: 276 10*3/uL (ref 150.0–400.0)
Potassium: 3.7 meq/L (ref 3.5–5.1)
RBC: 3.85 M/uL — ABNORMAL LOW (ref 3.87–5.11)
RDW: 12.8 % (ref 11.5–14.6)
Sodium: 141 meq/L (ref 135–145)
Total Bilirubin: 0.7 mg/dL (ref 0.3–1.2)
Total Protein: 6.8 g/dL (ref 6.0–8.3)
WBC: 16.3 10*3/uL — ABNORMAL HIGH (ref 4.5–10.5)

## 2009-07-13 ENCOUNTER — Encounter: Payer: Self-pay | Admitting: Internal Medicine

## 2009-07-14 ENCOUNTER — Ambulatory Visit: Payer: Self-pay | Admitting: Internal Medicine

## 2009-07-23 ENCOUNTER — Encounter: Payer: Self-pay | Admitting: Critical Care Medicine

## 2009-07-23 ENCOUNTER — Ambulatory Visit: Payer: Self-pay | Admitting: Internal Medicine

## 2009-08-01 ENCOUNTER — Ambulatory Visit: Payer: Self-pay | Admitting: Critical Care Medicine

## 2009-08-04 ENCOUNTER — Telehealth: Payer: Self-pay | Admitting: Internal Medicine

## 2009-08-04 ENCOUNTER — Ambulatory Visit: Payer: Self-pay | Admitting: Internal Medicine

## 2009-08-05 ENCOUNTER — Ambulatory Visit: Payer: Self-pay | Admitting: Critical Care Medicine

## 2009-08-11 ENCOUNTER — Telehealth: Payer: Self-pay | Admitting: Internal Medicine

## 2009-08-16 ENCOUNTER — Emergency Department (HOSPITAL_COMMUNITY): Admission: EM | Admit: 2009-08-16 | Discharge: 2009-08-16 | Payer: Self-pay | Admitting: Emergency Medicine

## 2009-09-01 ENCOUNTER — Encounter (INDEPENDENT_AMBULATORY_CARE_PROVIDER_SITE_OTHER): Payer: Self-pay | Admitting: *Deleted

## 2009-09-02 ENCOUNTER — Ambulatory Visit: Payer: Self-pay | Admitting: Internal Medicine

## 2009-09-02 LAB — CONVERTED CEMR LAB
ALT: 31 units/L (ref 0–35)
AST: 26 units/L (ref 0–37)
Albumin: 3.9 g/dL (ref 3.5–5.2)
Alkaline Phosphatase: 80 units/L (ref 39–117)
BUN: 10 mg/dL (ref 6–23)
Bilirubin, Direct: 0 mg/dL (ref 0.0–0.3)
CO2: 32 meq/L (ref 19–32)
Calcium: 9 mg/dL (ref 8.4–10.5)
Chloride: 99 meq/L (ref 96–112)
Cholesterol: 275 mg/dL — ABNORMAL HIGH (ref 0–200)
Creatinine, Ser: 0.7 mg/dL (ref 0.4–1.2)
Direct LDL: 176.1 mg/dL
GFR calc non Af Amer: 94.11 mL/min (ref >60)
Glucose, Bld: 133 mg/dL — ABNORMAL HIGH (ref 70–99)
HDL: 41.1 mg/dL (ref >39.00)
Hgb A1c MFr Bld: 7.1 % — ABNORMAL HIGH (ref 4.6–6.5)
Potassium: 3.6 meq/L (ref 3.5–5.1)
Sodium: 142 meq/L (ref 135–145)
Total Bilirubin: 1 mg/dL (ref 0.3–1.2)
Total CHOL/HDL Ratio: 7
Total Protein: 7.3 g/dL (ref 6.0–8.3)
Triglycerides: 456 mg/dL — ABNORMAL HIGH (ref 0.0–149.0)
VLDL: 91.2 mg/dL — ABNORMAL HIGH (ref 0.0–40.0)

## 2009-09-03 ENCOUNTER — Encounter: Payer: Self-pay | Admitting: Internal Medicine

## 2009-09-08 ENCOUNTER — Ambulatory Visit: Payer: Self-pay | Admitting: Critical Care Medicine

## 2009-09-08 ENCOUNTER — Telehealth: Payer: Self-pay | Admitting: Internal Medicine

## 2009-09-09 ENCOUNTER — Ambulatory Visit: Payer: Self-pay | Admitting: Internal Medicine

## 2009-09-10 ENCOUNTER — Emergency Department (HOSPITAL_COMMUNITY): Admission: EM | Admit: 2009-09-10 | Discharge: 2009-09-10 | Payer: Self-pay | Admitting: Emergency Medicine

## 2009-09-10 ENCOUNTER — Encounter (INDEPENDENT_AMBULATORY_CARE_PROVIDER_SITE_OTHER): Payer: Self-pay | Admitting: *Deleted

## 2009-09-11 ENCOUNTER — Telehealth: Payer: Self-pay | Admitting: Internal Medicine

## 2009-09-15 ENCOUNTER — Telehealth: Payer: Self-pay | Admitting: Internal Medicine

## 2009-09-18 ENCOUNTER — Ambulatory Visit: Payer: Self-pay | Admitting: Internal Medicine

## 2009-09-18 DIAGNOSIS — D539 Nutritional anemia, unspecified: Secondary | ICD-10-CM | POA: Insufficient documentation

## 2009-09-18 DIAGNOSIS — D649 Anemia, unspecified: Secondary | ICD-10-CM | POA: Insufficient documentation

## 2009-09-18 HISTORY — DX: Anemia, unspecified: D64.9

## 2009-09-20 ENCOUNTER — Encounter (INDEPENDENT_AMBULATORY_CARE_PROVIDER_SITE_OTHER): Payer: Self-pay | Admitting: *Deleted

## 2009-09-20 ENCOUNTER — Emergency Department (HOSPITAL_COMMUNITY): Admission: EM | Admit: 2009-09-20 | Discharge: 2009-09-20 | Payer: Self-pay | Admitting: Emergency Medicine

## 2009-09-20 ENCOUNTER — Encounter: Payer: Self-pay | Admitting: Nurse Practitioner

## 2009-09-21 IMAGING — CR DG CHEST 2V
2 series · 2 of 2 positions shown · non-contrast
Comparison: Chest x-ray of 11/07/2006

CLINICAL DATA: Preop for shoulder impingement

CHEST - 2 VIEW

[w chest pa]
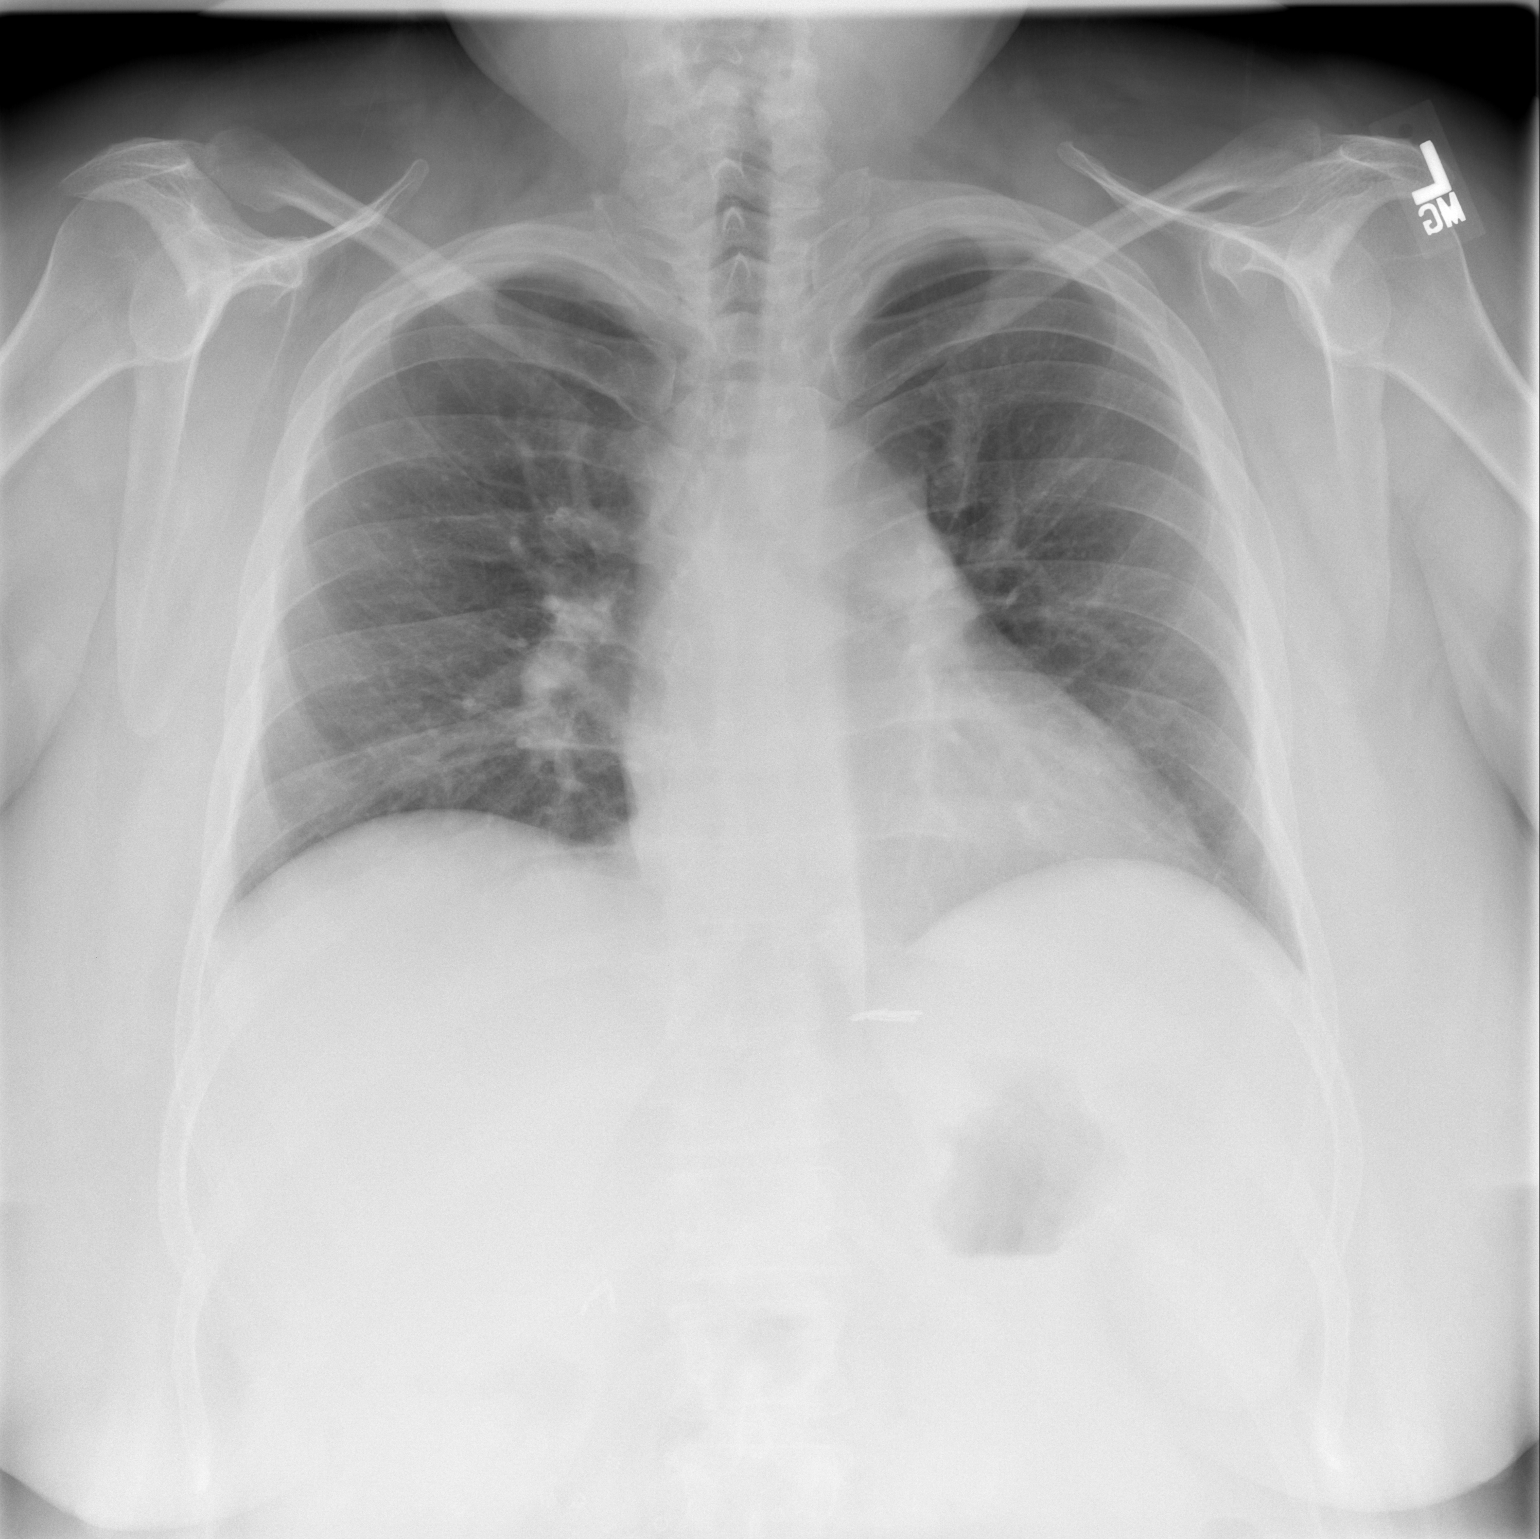

[w chest lat]
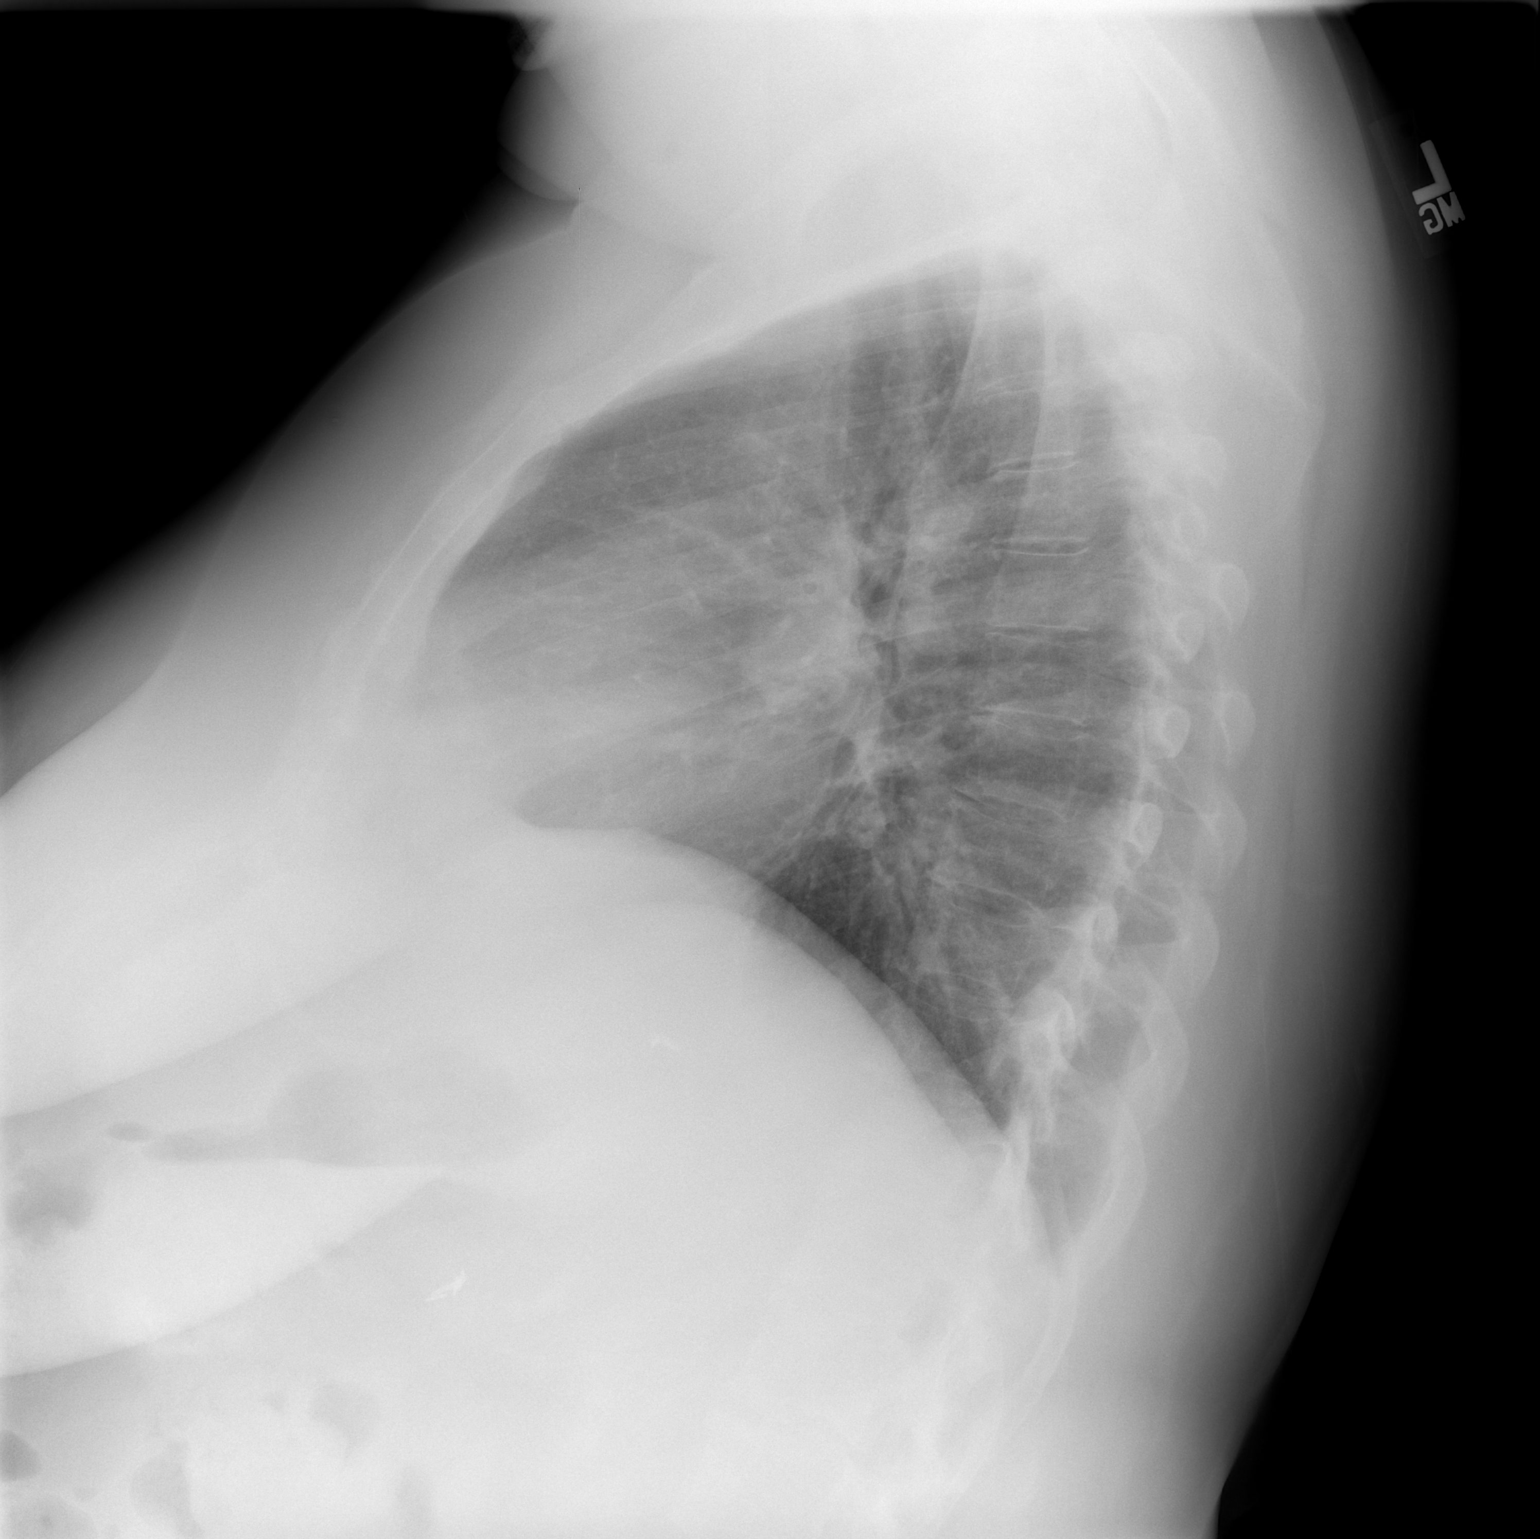

[2 of 2 positions shown; findings below may reference images not displayed]

FINDINGS: The lungs are clear.  Mild peribronchial thickening
remains.  The heart is within upper limits of normal.  Surgical
clips are noted around the GE junction.
IMPRESSION: Stable chest x-ray with borderline cardiomegaly.  No active lung
disease.

## 2009-09-22 ENCOUNTER — Telehealth: Payer: Self-pay | Admitting: Internal Medicine

## 2009-09-22 ENCOUNTER — Ambulatory Visit: Payer: Self-pay | Admitting: Internal Medicine

## 2009-09-29 LAB — CONVERTED CEMR LAB
Basophils Absolute: 0 10*3/uL (ref 0.0–0.1)
Basophils Relative: 0 % (ref 0.0–3.0)
Eosinophils Absolute: 0.2 10*3/uL (ref 0.0–0.7)
Eosinophils Relative: 1.5 % (ref 0.0–5.0)
HCT: 37.4 % (ref 36.0–46.0)
Hemoglobin: 12.8 g/dL (ref 12.0–15.0)
Lymphocytes Relative: 28.3 % (ref 12.0–46.0)
Lymphs Abs: 4.7 10*3/uL — ABNORMAL HIGH (ref 0.7–4.0)
MCHC: 34.2 g/dL (ref 30.0–36.0)
MCV: 95.8 fL (ref 78.0–100.0)
Monocytes Absolute: 1 10*3/uL (ref 0.1–1.0)
Monocytes Relative: 6.2 % (ref 3.0–12.0)
Neutro Abs: 10.6 10*3/uL — ABNORMAL HIGH (ref 1.4–7.7)
Neutrophils Relative %: 64 % (ref 43.0–77.0)
Platelets: 310 10*3/uL (ref 150.0–400.0)
RBC: 3.9 M/uL (ref 3.87–5.11)
RDW: 12.5 % (ref 11.5–14.6)
WBC: 16.5 10*3/uL — ABNORMAL HIGH (ref 4.5–10.5)

## 2009-10-06 ENCOUNTER — Encounter: Admission: RE | Admit: 2009-10-06 | Discharge: 2009-10-07 | Payer: Self-pay | Admitting: Internal Medicine

## 2009-10-13 ENCOUNTER — Encounter (INDEPENDENT_AMBULATORY_CARE_PROVIDER_SITE_OTHER): Payer: Self-pay | Admitting: *Deleted

## 2009-10-14 ENCOUNTER — Ambulatory Visit: Payer: Self-pay | Admitting: Internal Medicine

## 2009-10-15 ENCOUNTER — Ambulatory Visit (HOSPITAL_BASED_OUTPATIENT_CLINIC_OR_DEPARTMENT_OTHER): Admission: RE | Admit: 2009-10-15 | Discharge: 2009-10-15 | Payer: Self-pay | Admitting: Critical Care Medicine

## 2009-10-15 ENCOUNTER — Encounter: Payer: Self-pay | Admitting: Critical Care Medicine

## 2009-10-16 ENCOUNTER — Encounter: Admission: RE | Admit: 2009-10-16 | Discharge: 2009-10-23 | Payer: Self-pay | Admitting: Internal Medicine

## 2009-10-20 ENCOUNTER — Ambulatory Visit: Payer: Self-pay | Admitting: Pulmonary Disease

## 2009-10-21 ENCOUNTER — Ambulatory Visit: Payer: Self-pay | Admitting: Pulmonary Disease

## 2009-10-22 ENCOUNTER — Encounter: Payer: Self-pay | Admitting: Internal Medicine

## 2009-10-28 ENCOUNTER — Emergency Department (HOSPITAL_COMMUNITY): Admission: EM | Admit: 2009-10-28 | Discharge: 2009-10-28 | Payer: Self-pay | Admitting: Emergency Medicine

## 2009-10-28 ENCOUNTER — Encounter: Payer: Self-pay | Admitting: Internal Medicine

## 2009-10-28 ENCOUNTER — Encounter (INDEPENDENT_AMBULATORY_CARE_PROVIDER_SITE_OTHER): Payer: Self-pay | Admitting: *Deleted

## 2009-10-28 ENCOUNTER — Ambulatory Visit: Payer: Self-pay | Admitting: Family Medicine

## 2009-11-05 ENCOUNTER — Ambulatory Visit: Payer: Self-pay | Admitting: Internal Medicine

## 2009-11-07 ENCOUNTER — Ambulatory Visit: Payer: Self-pay | Admitting: Critical Care Medicine

## 2009-11-10 ENCOUNTER — Telehealth: Payer: Self-pay | Admitting: Internal Medicine

## 2009-11-11 ENCOUNTER — Ambulatory Visit: Payer: Self-pay | Admitting: Internal Medicine

## 2009-11-14 ENCOUNTER — Ambulatory Visit: Payer: Self-pay | Admitting: Internal Medicine

## 2009-11-17 ENCOUNTER — Encounter: Payer: Self-pay | Admitting: Pulmonary Disease

## 2009-11-19 ENCOUNTER — Encounter: Payer: Self-pay | Admitting: Pulmonary Disease

## 2009-11-25 ENCOUNTER — Ambulatory Visit: Payer: Self-pay | Admitting: Internal Medicine

## 2009-12-02 ENCOUNTER — Ambulatory Visit: Payer: Self-pay | Admitting: Internal Medicine

## 2009-12-02 LAB — CONVERTED CEMR LAB
ALT: 16 units/L (ref 0–35)
AST: 19 units/L (ref 0–37)
Albumin: 3.8 g/dL (ref 3.5–5.2)
Alkaline Phosphatase: 85 units/L (ref 39–117)
BUN: 6 mg/dL (ref 6–23)
Bilirubin, Direct: 0.1 mg/dL (ref 0.0–0.3)
CO2: 32 meq/L (ref 19–32)
Calcium: 9 mg/dL (ref 8.4–10.5)
Chloride: 104 meq/L (ref 96–112)
Cholesterol: 171 mg/dL (ref 0–200)
Creatinine, Ser: 0.7 mg/dL (ref 0.4–1.2)
Direct LDL: 107.7 mg/dL
GFR calc non Af Amer: 94.01 mL/min (ref >60)
Glucose, Bld: 114 mg/dL — ABNORMAL HIGH (ref 70–99)
HDL: 44.1 mg/dL (ref >39.00)
Hgb A1c MFr Bld: 6.6 % — ABNORMAL HIGH (ref 4.6–6.5)
Potassium: 3.4 meq/L — ABNORMAL LOW (ref 3.5–5.1)
Sodium: 143 meq/L (ref 135–145)
Total Bilirubin: 0.5 mg/dL (ref 0.3–1.2)
Total CHOL/HDL Ratio: 4
Total Protein: 7.2 g/dL (ref 6.0–8.3)
Triglycerides: 314 mg/dL — ABNORMAL HIGH (ref 0.0–149.0)
VLDL: 62.8 mg/dL — ABNORMAL HIGH (ref 0.0–40.0)

## 2009-12-09 ENCOUNTER — Ambulatory Visit: Payer: Self-pay | Admitting: Internal Medicine

## 2009-12-09 ENCOUNTER — Ambulatory Visit: Payer: Self-pay | Admitting: Pulmonary Disease

## 2009-12-15 ENCOUNTER — Encounter: Payer: Self-pay | Admitting: Pulmonary Disease

## 2009-12-17 ENCOUNTER — Ambulatory Visit: Payer: Self-pay | Admitting: Family Medicine

## 2009-12-18 ENCOUNTER — Telehealth: Payer: Self-pay | Admitting: Family Medicine

## 2009-12-22 ENCOUNTER — Encounter: Payer: Self-pay | Admitting: Critical Care Medicine

## 2009-12-23 ENCOUNTER — Ambulatory Visit: Payer: Self-pay | Admitting: Critical Care Medicine

## 2009-12-31 ENCOUNTER — Encounter: Payer: Self-pay | Admitting: Internal Medicine

## 2010-01-06 ENCOUNTER — Encounter: Payer: Self-pay | Admitting: Internal Medicine

## 2010-01-07 ENCOUNTER — Encounter: Payer: Self-pay | Admitting: Internal Medicine

## 2010-01-12 ENCOUNTER — Encounter: Payer: Self-pay | Admitting: Pulmonary Disease

## 2010-01-13 ENCOUNTER — Ambulatory Visit: Payer: Self-pay | Admitting: Internal Medicine

## 2010-01-13 LAB — CONVERTED CEMR LAB
Glucose, Urine, Semiquant: NEGATIVE
Nitrite: NEGATIVE
Protein, U semiquant: NEGATIVE
Specific Gravity, Urine: 1.015
Urobilinogen, UA: 0.2
pH: 6

## 2010-01-16 ENCOUNTER — Encounter: Admission: RE | Admit: 2010-01-16 | Discharge: 2010-01-16 | Payer: Self-pay | Admitting: Obstetrics and Gynecology

## 2010-01-20 ENCOUNTER — Ambulatory Visit: Payer: Self-pay | Admitting: Internal Medicine

## 2010-01-29 ENCOUNTER — Telehealth: Payer: Self-pay | Admitting: Internal Medicine

## 2010-02-04 ENCOUNTER — Ambulatory Visit: Payer: Self-pay | Admitting: Internal Medicine

## 2010-02-10 ENCOUNTER — Emergency Department (HOSPITAL_COMMUNITY): Admission: EM | Admit: 2010-02-10 | Discharge: 2010-02-10 | Payer: Self-pay | Admitting: Emergency Medicine

## 2010-02-12 ENCOUNTER — Encounter: Payer: Self-pay | Admitting: Critical Care Medicine

## 2010-02-16 IMAGING — CR DG LUMBAR SPINE COMPLETE 4+V
5 series · 5 of 5 positions shown · non-contrast
Comparison: None

CLINICAL DATA: Bilateral low back pain for 6 months, no acute
trauma

LUMBAR SPINE - COMPLETE 4+ VIEW

[t l-spine a.p.]
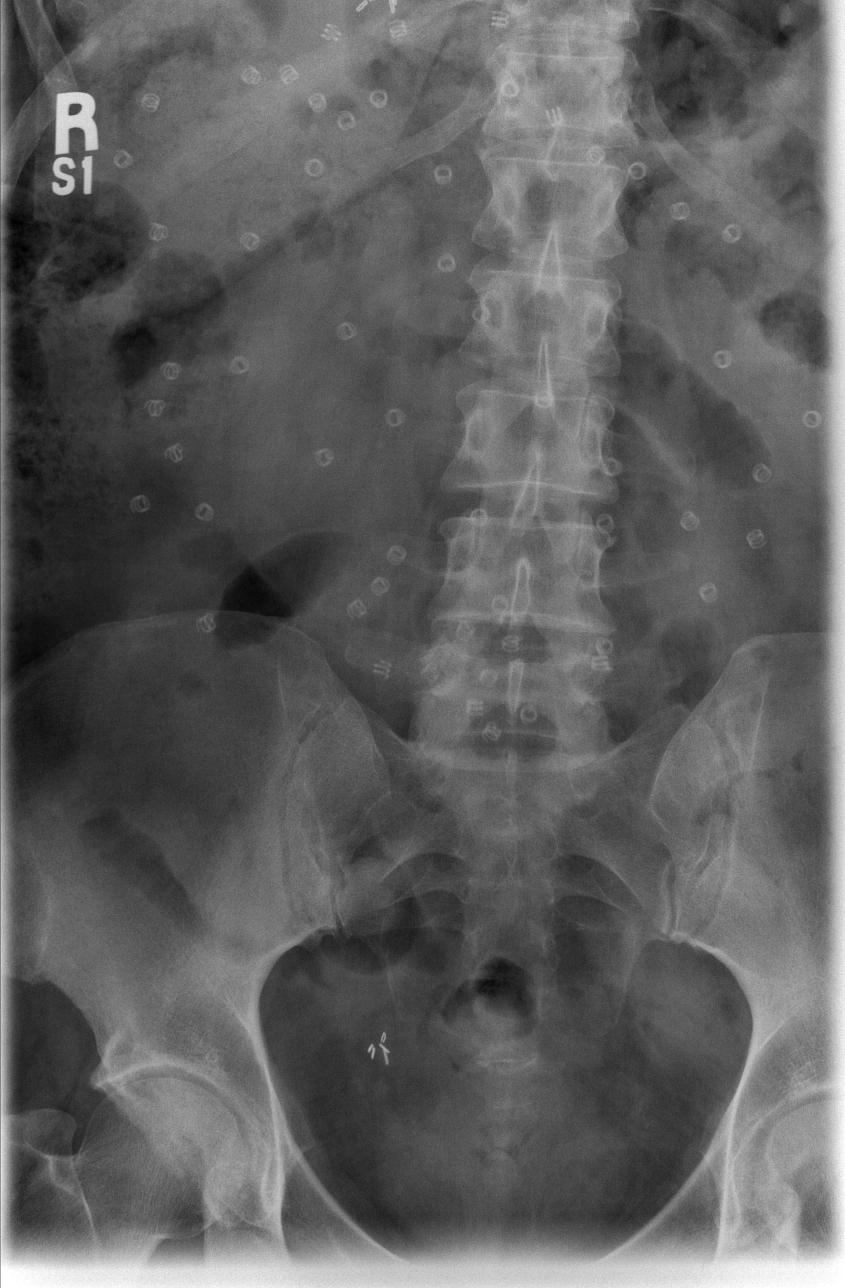

[t l-spine oblique exposure (1 of 2)]
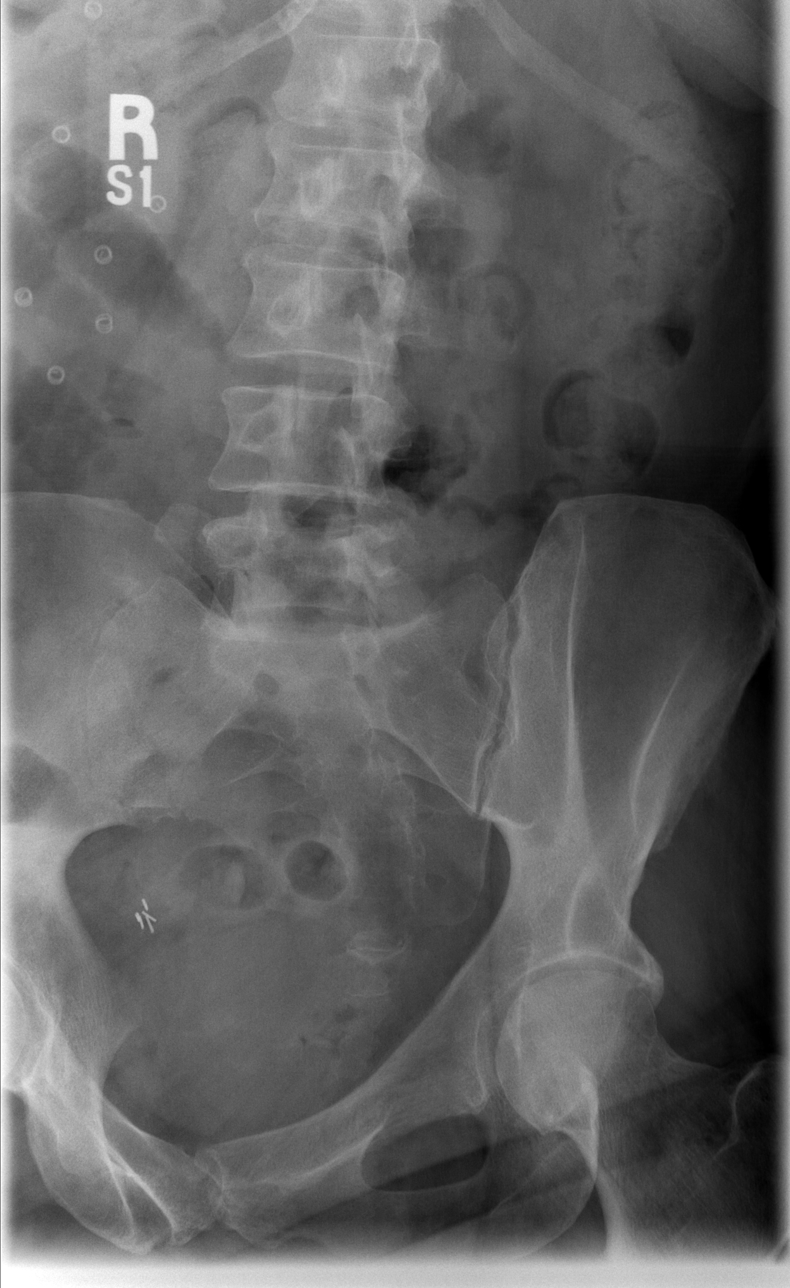

[t l-spine oblique exposure (2 of 2)]
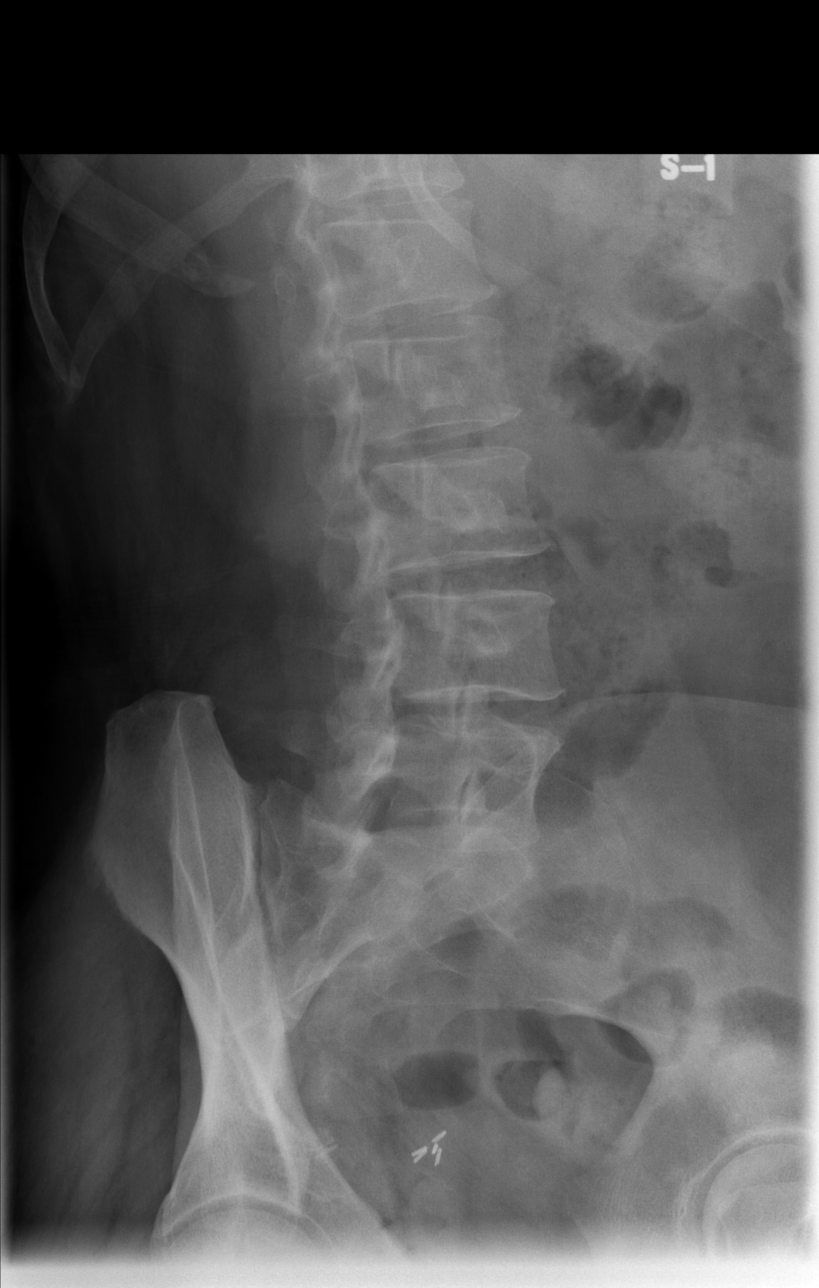

[t l-spine lat]
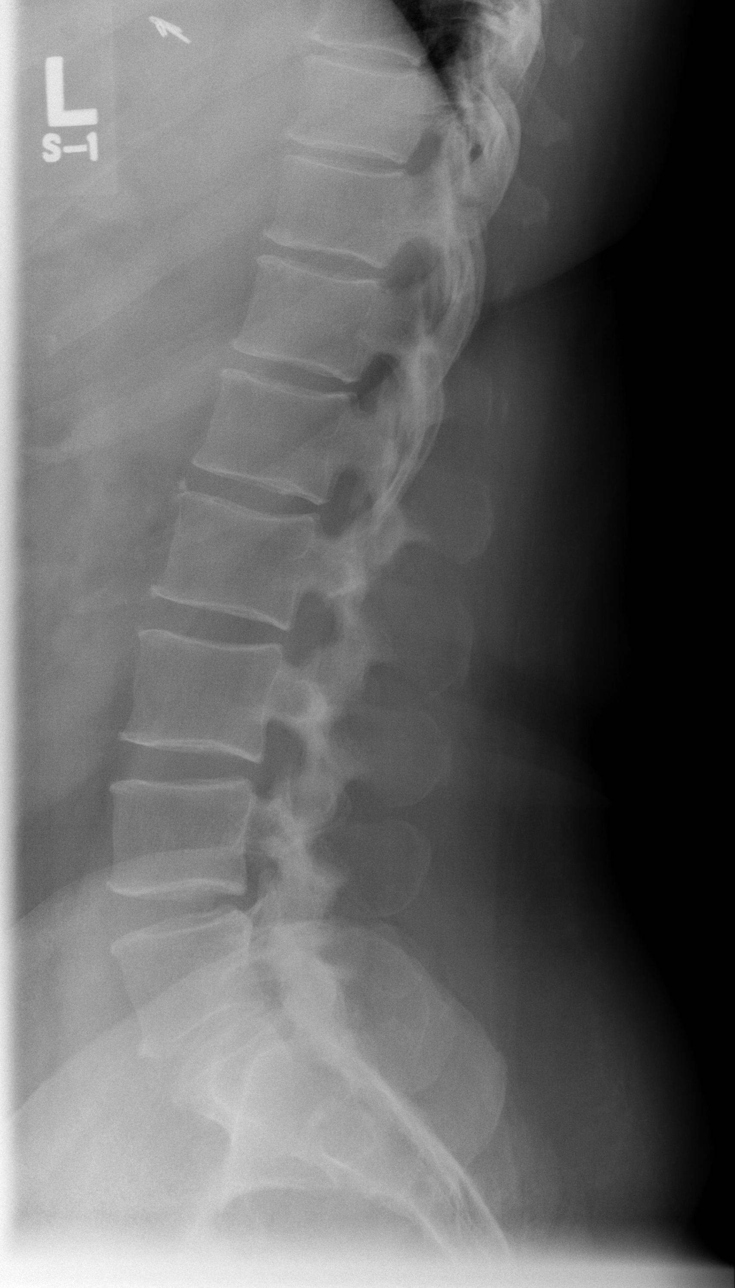

[t l-spine l5-s1 spot]
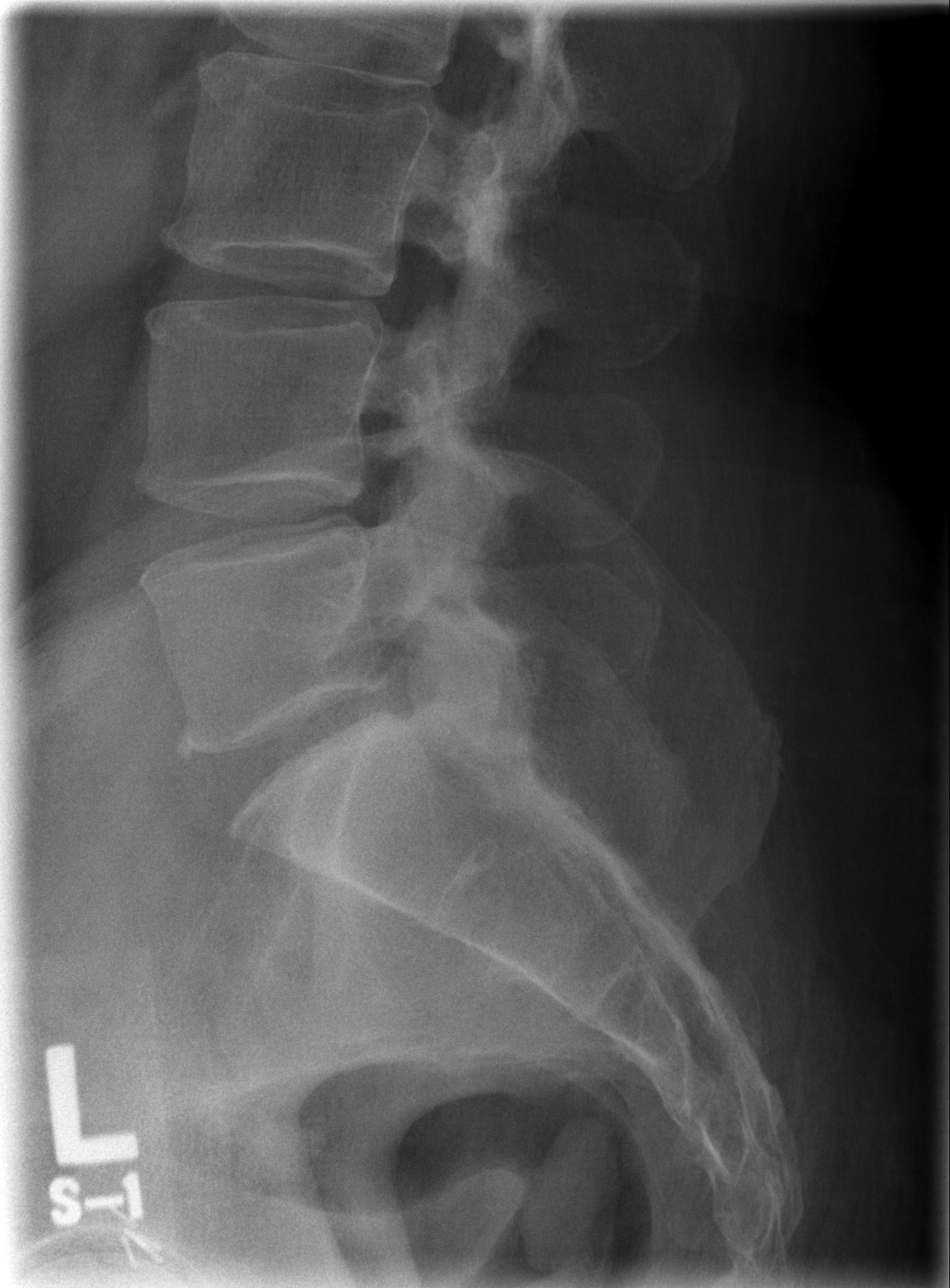

[5 of 5 positions shown; findings below may reference images not displayed]

FINDINGS: The lumbar vertebrae are in normal alignment.  No
compression deformity is seen.  Only mild degenerative change is
noted involving the facet joints of L5-S1.  The SI joints appear
normal.
IMPRESSION: Normal alignment with normal disc spaces.  No acute abnormality.

## 2010-02-18 ENCOUNTER — Ambulatory Visit: Payer: Self-pay | Admitting: Internal Medicine

## 2010-02-23 ENCOUNTER — Telehealth: Payer: Self-pay | Admitting: Internal Medicine

## 2010-02-25 ENCOUNTER — Ambulatory Visit: Payer: Self-pay | Admitting: Family Medicine

## 2010-02-26 ENCOUNTER — Encounter: Payer: Self-pay | Admitting: Internal Medicine

## 2010-03-07 IMAGING — CR DG CHEST 1V PORT
1 series · 1 of 1 positions shown · non-contrast
Comparison: 11/11/2008.

CLINICAL DATA: Coughing.  Chest tightness.  Short of breath.

PORTABLE CHEST - 1 VIEW

[view not recorded]
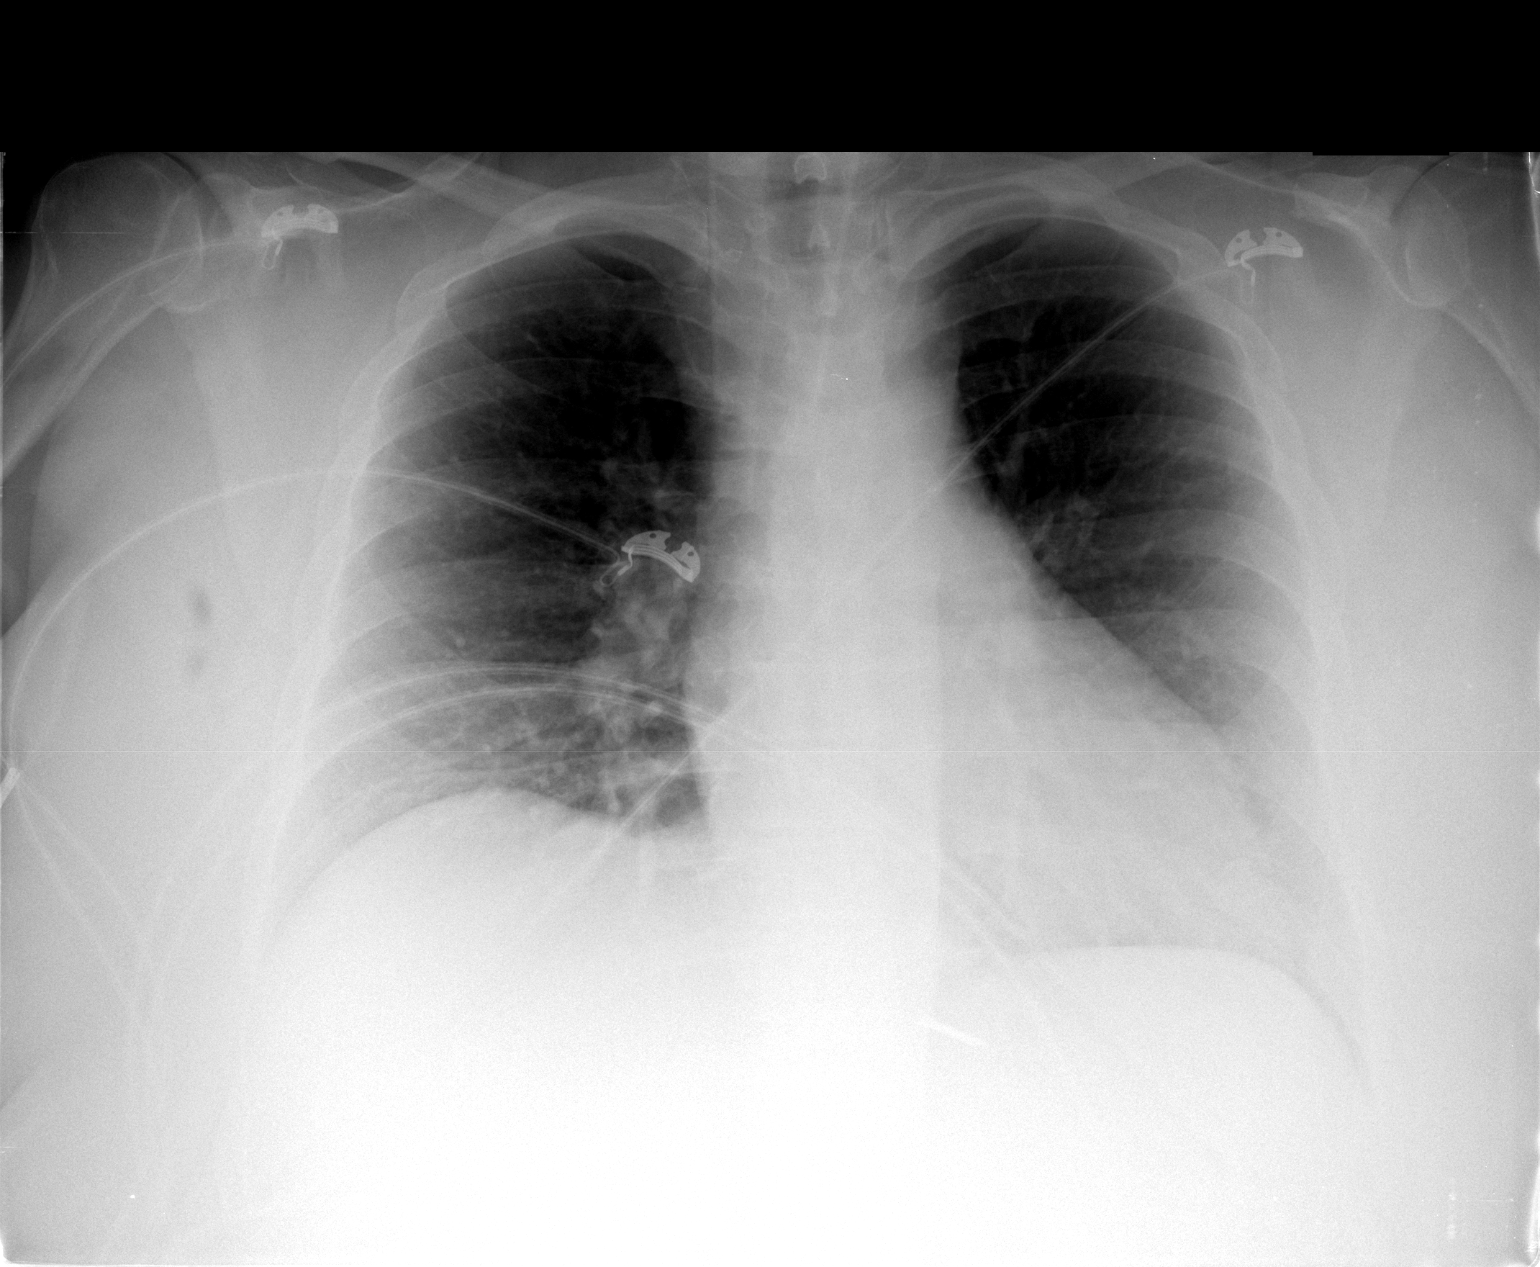

[1 of 1 positions shown; findings below may reference images not displayed]

FINDINGS: Cardiopericardial silhouette is upper limits of normal
for projection.  Monitoring leads are projected over the chest.
Trachea midline.  Mediastinal contours unchanged compared to prior
examination.  Radiopaque material is projected over the tracheal
air shadow, likely representing object external to the patient.
Mild bibasilar atelectasis.  Lung volumes are slightly lower than
on prior exam. Surgical clips identified near the gastroesophageal
junction.
IMPRESSION: Slightly low lung volumes with bibasilar atelectasis.

## 2010-03-08 IMAGING — CT CT ANGIO CHEST
3 of 11 series · 18 of 38 positions shown · IV contrast (APPLIED)
Comparison: CT chest 03/31/2007, CT abdomen and pelvis 05/13/2005

CTA CHEST

CLINICAL DATA: Shortness of breath, cough, leukocytosis, elevated
lactic acid, abnormal LFTs, history bronchitis, sarcoidosis, chest
pain

CT ANGIOGRAPHY CHEST
CT ABDOMEN AND PELVIS WITH CONTRAST
TECHNIQUE: Multidetector CT imaging of the chest was performed
using the standard protocol during bolus administration of
intravenous contrast. Multiplanar CT image reconstructions
including MIPs were obtained to evaluate the vascular anatomy.
Multidetector CT imaging of the abdomen and pelvis was performed
intravenous contrast. Breast shield utilized.  Sagittal and coronal
MPR images of the abdomen and pelvis reconstructed from axial data
set.
Contrast: Dilute oral contrast and 100 ml Smnipaque-GXX IV

[Series 6: abd/pelv with 5.0 b31f st · axial · 0.77mm/px · z∈[-600,-260]mm · 5 of 103 slices shown]
[im 18/103  lung]
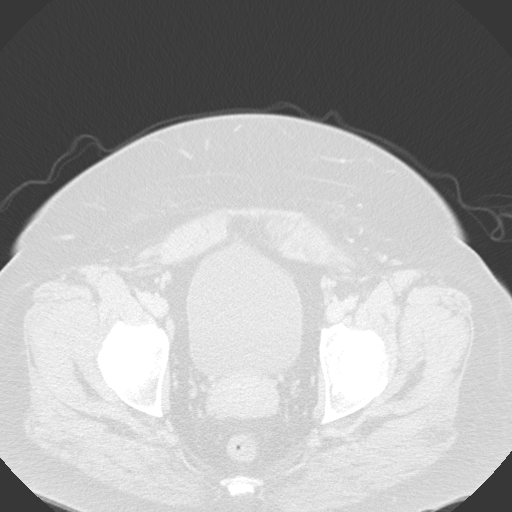
[im 35/103  lung]
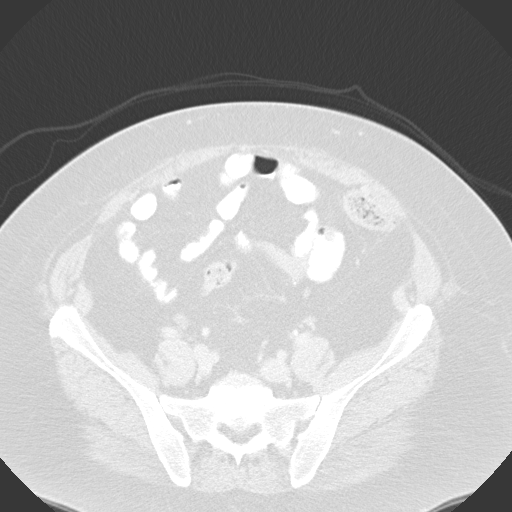
[im 52/103  lung]
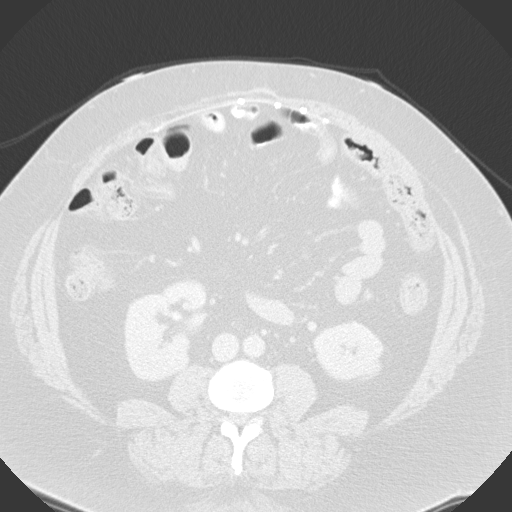
[im 69/103  lung]
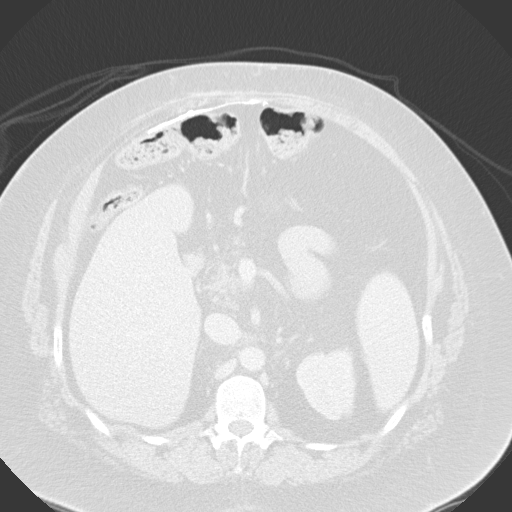
[im 86/103  lung]
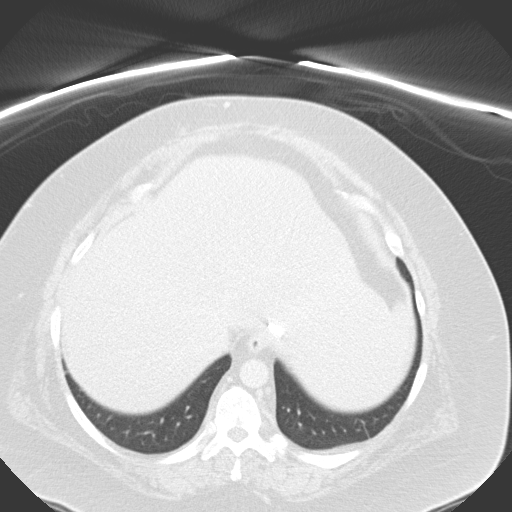

[Series 12: pulm embolism 1.0 b25f thins · axial · 0.70mm/px · z∈[-235,-63]mm · 12 of 204 slices shown]
[im 16/204  lung]
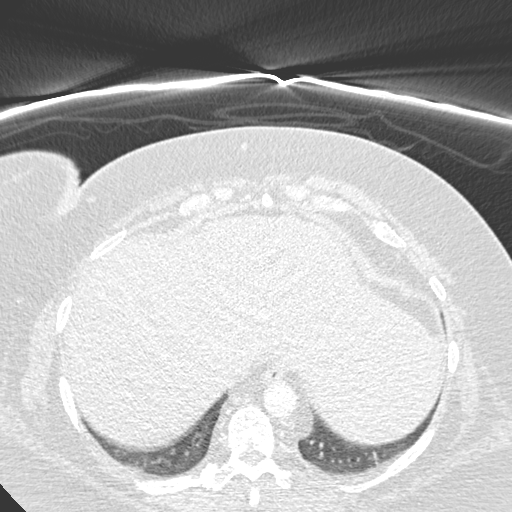
[im 32/204  mediastinal]
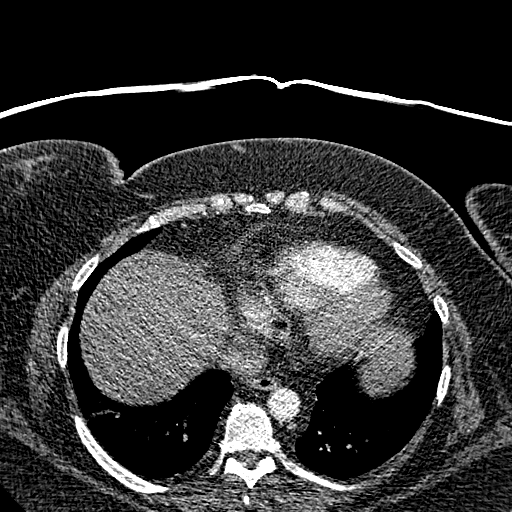
[im 47/204  lung]
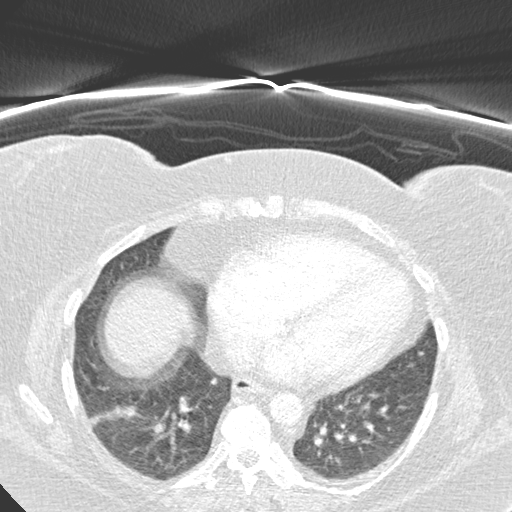
[im 63/204  mediastinal]
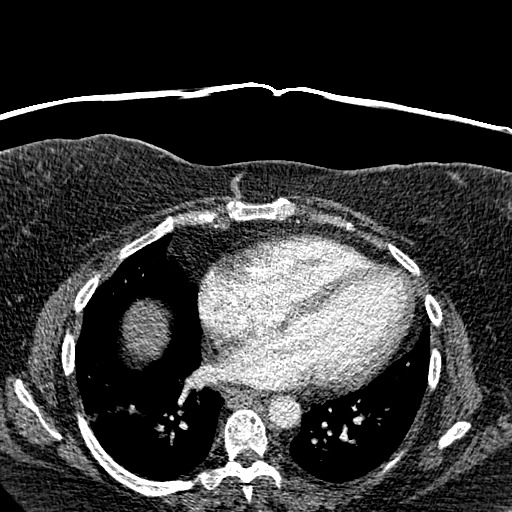
[im 79/204  lung]
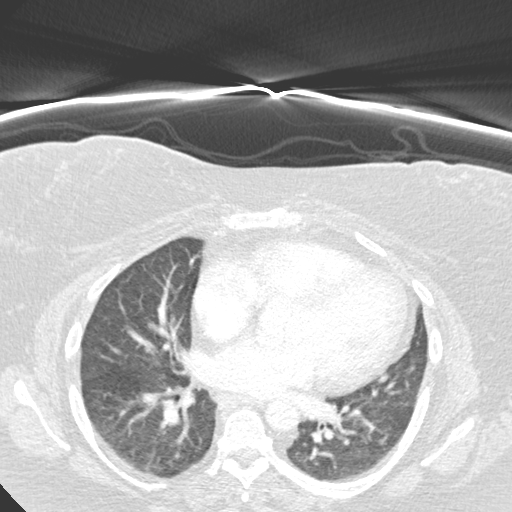
[im 94/204  mediastinal]
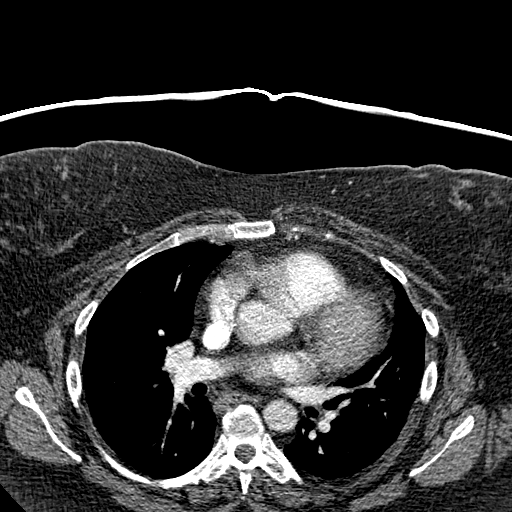
[im 110/204  lung]
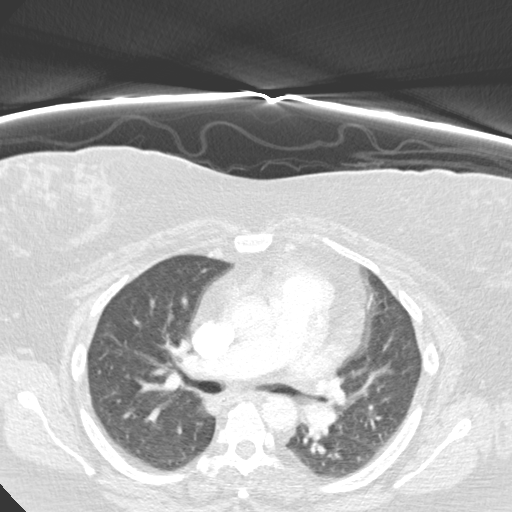
[im 125/204  mediastinal]
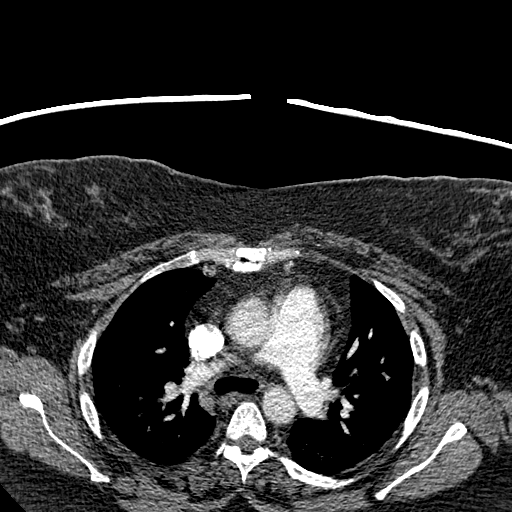
[im 141/204  lung]
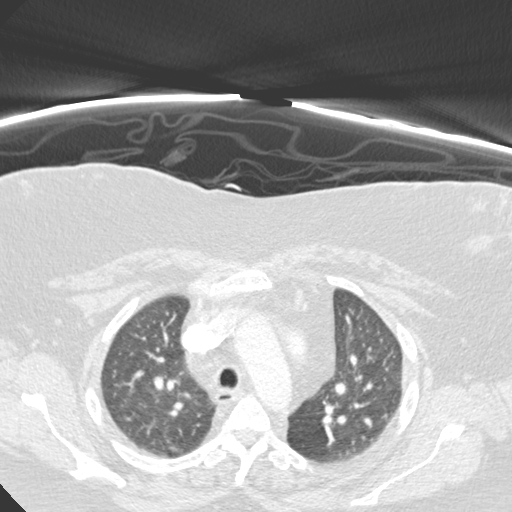
[im 157/204  mediastinal]
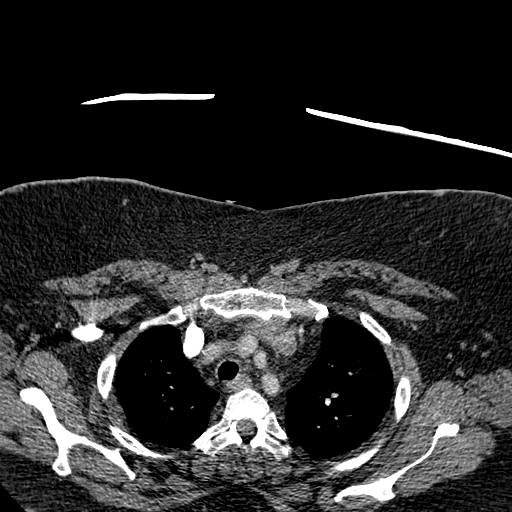
[im 172/204  lung]
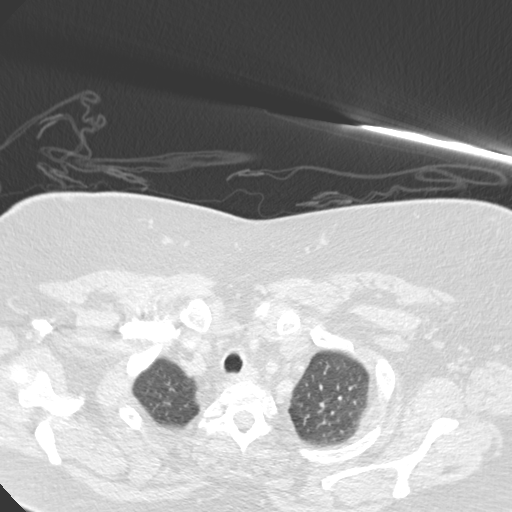
[im 188/204  mediastinal]
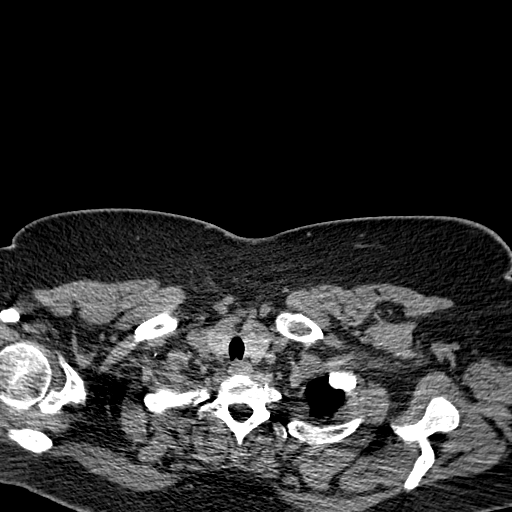

[Series 607: cor a/p · coronal · 1.00mm/px · 1 of 158 slices shown]
[im 79/158  mediastinal]
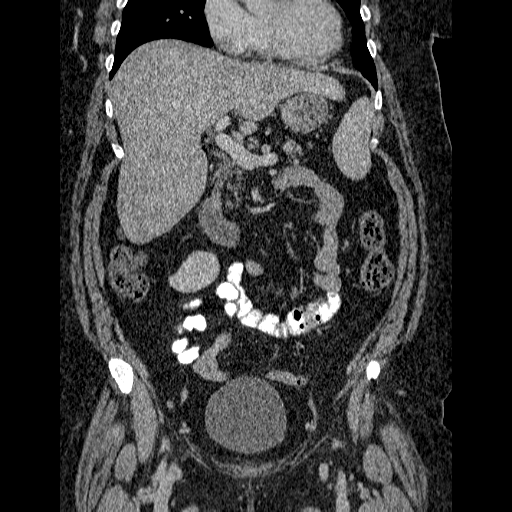

[18 of 38 positions shown; findings below may reference images not displayed]

FINDINGS: Aorta normal caliber without aneurysm or dissection.
Heart appears minimally enlarged.
Pulmonary arteries grossly patent.
No evidence of pulmonary embolism.
Beam hardening artifacts secondary to patient's size.
Lungs grossly clear.
Bones unremarkable.

Review of the MIP images confirms the above findings.
IMPRESSION: No evidence of pulmonary embolism.
No acute intrathoracic abnormalities.

CT ABDOMEN
FINDINGS: Status post cholecystectomy.
No focal abnormalities of liver, spleen, pancreas, kidneys, or
adrenal glands.
Prior ventral hernia repair with mesh graft.
Large and small bowel loops in upper abdomen normal.
No upper abdominal mass, adenopathy, or free fluid.
No acute bony findings.
IMPRESSION: No acute upper abdominal abnormalities.
Status post cholecystectomy and ventral hernia repair.

CT PELVIS
FINDINGS: Bladder and pelvic bowel loops unremarkable.
Appendix not identified.
No pelvic mass, adenopathy, free fluid, or inflammatory process.
Bones unremarkable.
IMPRESSION: No acute intrapelvic abnormalities.

## 2010-03-11 ENCOUNTER — Ambulatory Visit: Payer: Self-pay | Admitting: Internal Medicine

## 2010-03-13 ENCOUNTER — Encounter (INDEPENDENT_AMBULATORY_CARE_PROVIDER_SITE_OTHER): Payer: Self-pay | Admitting: *Deleted

## 2010-03-15 ENCOUNTER — Emergency Department (HOSPITAL_COMMUNITY): Admission: EM | Admit: 2010-03-15 | Discharge: 2010-03-15 | Payer: Self-pay | Admitting: Emergency Medicine

## 2010-03-16 ENCOUNTER — Ambulatory Visit: Payer: Self-pay | Admitting: Internal Medicine

## 2010-03-17 ENCOUNTER — Encounter: Payer: Self-pay | Admitting: Internal Medicine

## 2010-03-25 ENCOUNTER — Telehealth: Payer: Self-pay | Admitting: Internal Medicine

## 2010-03-27 ENCOUNTER — Encounter: Payer: Self-pay | Admitting: Internal Medicine

## 2010-04-07 ENCOUNTER — Ambulatory Visit: Payer: Self-pay | Admitting: Critical Care Medicine

## 2010-04-10 ENCOUNTER — Telehealth (INDEPENDENT_AMBULATORY_CARE_PROVIDER_SITE_OTHER): Payer: Self-pay | Admitting: *Deleted

## 2010-04-15 ENCOUNTER — Ambulatory Visit: Payer: Self-pay | Admitting: Internal Medicine

## 2010-04-15 LAB — CONVERTED CEMR LAB
ALT: 11 units/L (ref 0–35)
AST: 17 units/L (ref 0–37)
Albumin: 4.5 g/dL (ref 3.5–5.2)
Alkaline Phosphatase: 72 units/L (ref 39–117)
BUN: 15 mg/dL (ref 6–23)
Bilirubin, Direct: 0.2 mg/dL (ref 0.0–0.3)
CO2: 32 meq/L (ref 19–32)
Calcium: 9.6 mg/dL (ref 8.4–10.5)
Chloride: 98 meq/L (ref 96–112)
Cholesterol: 190 mg/dL (ref 0–200)
Creatinine, Ser: 0.6 mg/dL (ref 0.4–1.2)
Direct LDL: 110.6 mg/dL
GFR calc non Af Amer: 114.35 mL/min (ref >60)
Glucose, Bld: 127 mg/dL — ABNORMAL HIGH (ref 70–99)
HDL: 41.7 mg/dL (ref >39.00)
Hgb A1c MFr Bld: 6.8 % — ABNORMAL HIGH (ref 4.6–6.5)
Potassium: 4.1 meq/L (ref 3.5–5.1)
Sodium: 143 meq/L (ref 135–145)
Total Bilirubin: 0.8 mg/dL (ref 0.3–1.2)
Total CHOL/HDL Ratio: 5
Total Protein: 7.9 g/dL (ref 6.0–8.3)
Triglycerides: 333 mg/dL — ABNORMAL HIGH (ref 0.0–149.0)
VLDL: 66.6 mg/dL — ABNORMAL HIGH (ref 0.0–40.0)

## 2010-04-16 ENCOUNTER — Encounter (INDEPENDENT_AMBULATORY_CARE_PROVIDER_SITE_OTHER): Payer: Self-pay | Admitting: *Deleted

## 2010-04-20 ENCOUNTER — Ambulatory Visit: Payer: Self-pay | Admitting: Internal Medicine

## 2010-04-23 ENCOUNTER — Ambulatory Visit: Payer: Self-pay | Admitting: Internal Medicine

## 2010-04-27 LAB — CONVERTED CEMR LAB
Basophils Absolute: 0.1 10*3/uL (ref 0.0–0.1)
Basophils Relative: 0.3 % (ref 0.0–3.0)
Eosinophils Absolute: 0.4 10*3/uL (ref 0.0–0.7)
Eosinophils Relative: 2.2 % (ref 0.0–5.0)
HCT: 39.4 % (ref 36.0–46.0)
Hemoglobin: 13.7 g/dL (ref 12.0–15.0)
Lipase: 5 units/L — ABNORMAL LOW (ref 11.0–59.0)
Lymphocytes Relative: 27.7 % (ref 12.0–46.0)
Lymphs Abs: 4.9 10*3/uL — ABNORMAL HIGH (ref 0.7–4.0)
MCHC: 34.7 g/dL (ref 30.0–36.0)
MCV: 95.6 fL (ref 78.0–100.0)
Monocytes Absolute: 0.9 10*3/uL (ref 0.1–1.0)
Monocytes Relative: 5 % (ref 3.0–12.0)
Neutro Abs: 11.6 10*3/uL — ABNORMAL HIGH (ref 1.4–7.7)
Neutrophils Relative %: 64.8 % (ref 43.0–77.0)
Platelets: 344 10*3/uL (ref 150.0–400.0)
RBC: 4.12 M/uL (ref 3.87–5.11)
RDW: 12.8 % (ref 11.5–14.6)
Sed Rate: 31 mm/hr — ABNORMAL HIGH (ref 0–22)
WBC: 17.9 10*3/uL — ABNORMAL HIGH (ref 4.5–10.5)

## 2010-04-28 ENCOUNTER — Telehealth: Payer: Self-pay | Admitting: Internal Medicine

## 2010-04-29 ENCOUNTER — Ambulatory Visit: Payer: Self-pay | Admitting: Internal Medicine

## 2010-04-29 HISTORY — PX: COLONOSCOPY: SHX174

## 2010-04-29 LAB — HM COLONOSCOPY

## 2010-05-11 ENCOUNTER — Ambulatory Visit (HOSPITAL_BASED_OUTPATIENT_CLINIC_OR_DEPARTMENT_OTHER): Admission: RE | Admit: 2010-05-11 | Discharge: 2010-05-11 | Payer: Self-pay | Admitting: Orthopedic Surgery

## 2010-05-21 ENCOUNTER — Ambulatory Visit: Payer: Self-pay | Admitting: Internal Medicine

## 2010-05-24 IMAGING — CT CT HEAD W/O CM
1 series · 16 of 30 positions shown, 20 images · non-contrast
Comparison: MRI brain 06/04/2008.

CLINICAL DATA: Severe headache.

CT HEAD WITHOUT CONTRAST
TECHNIQUE: Contiguous axial images were obtained from the base of
the skull through the vertex without contrast.

[Series 2: head_seq 4.5 h37s st · axial · 0.42mm/px · z∈[-163,-37]mm · 16 of 32 slices shown, 20 images]
[im 2/32  brain]
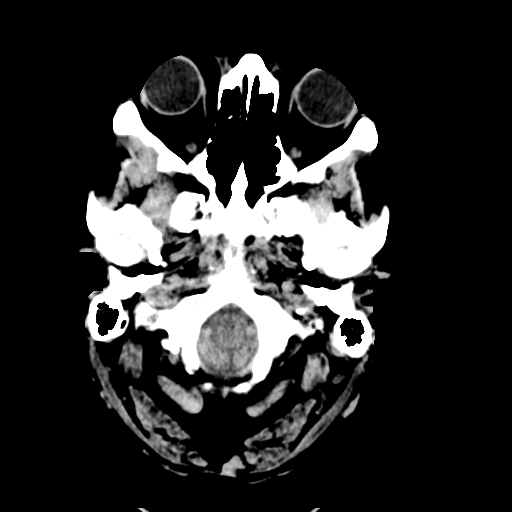
[im 2/32  bone]
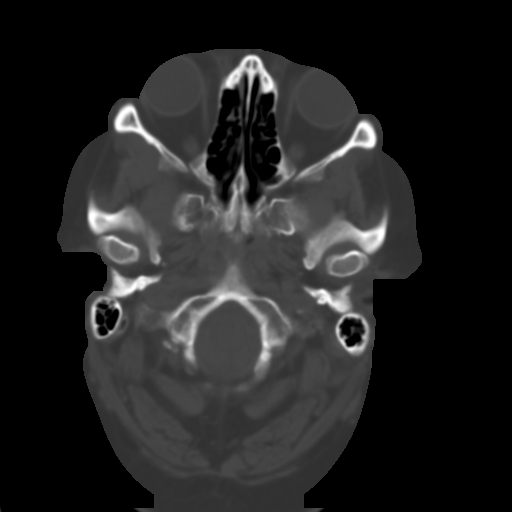
[im 4/32  brain]
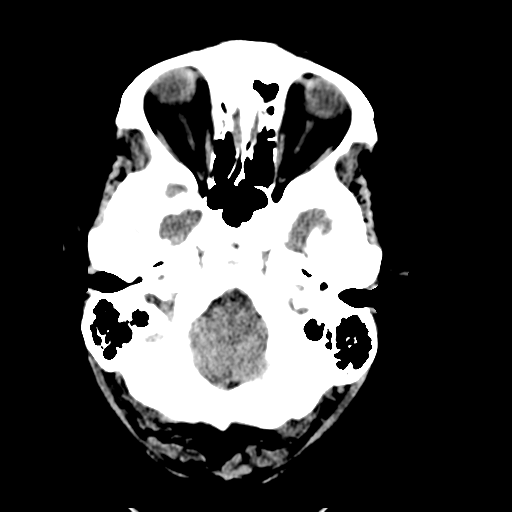
[im 6/32  brain]
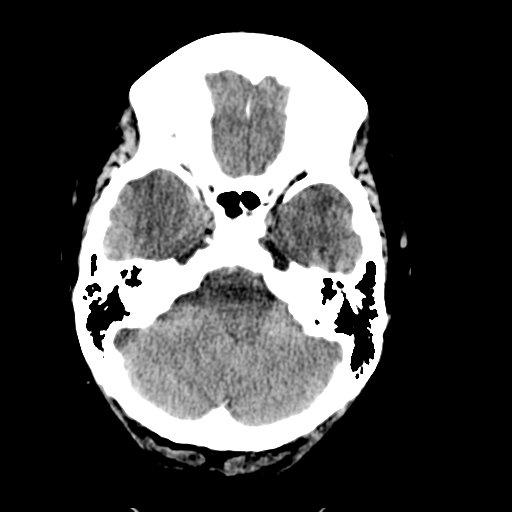
[im 8/32  brain]
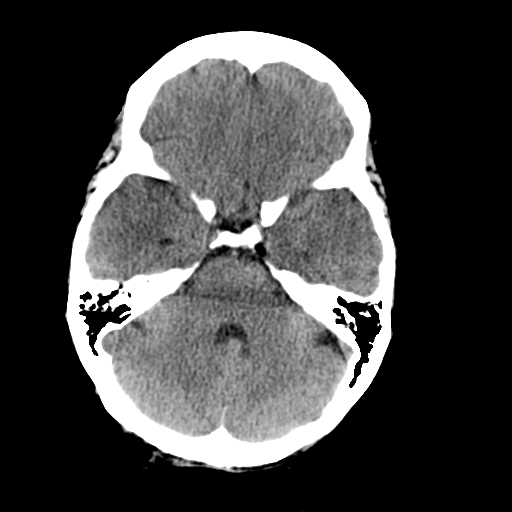
[im 9/32  brain]
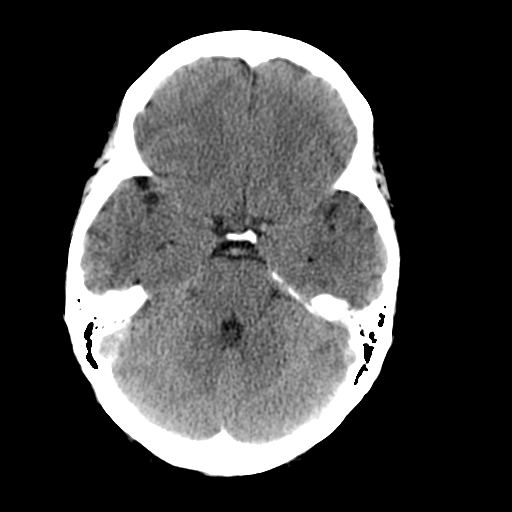
[im 9/32  bone]
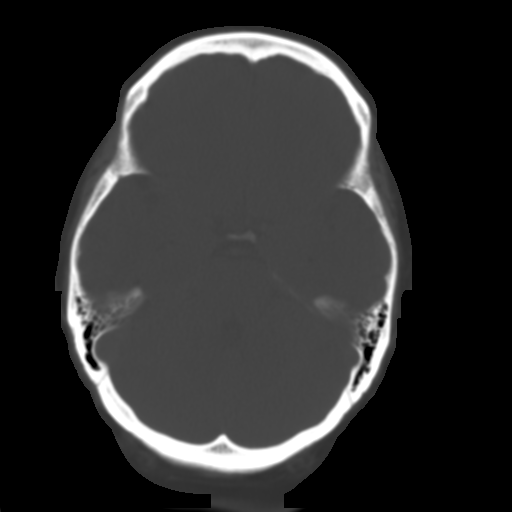
[im 11/32  brain]
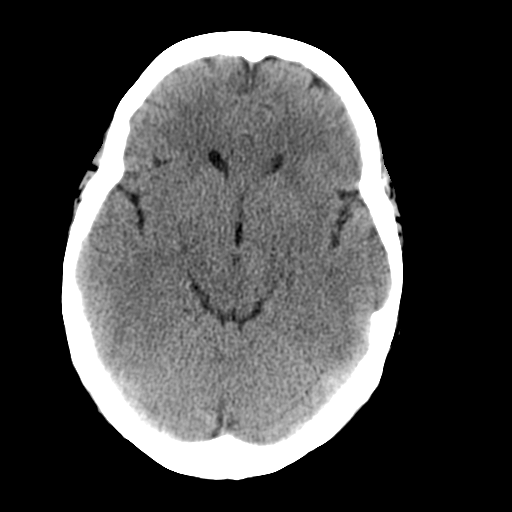
[im 13/32  brain]
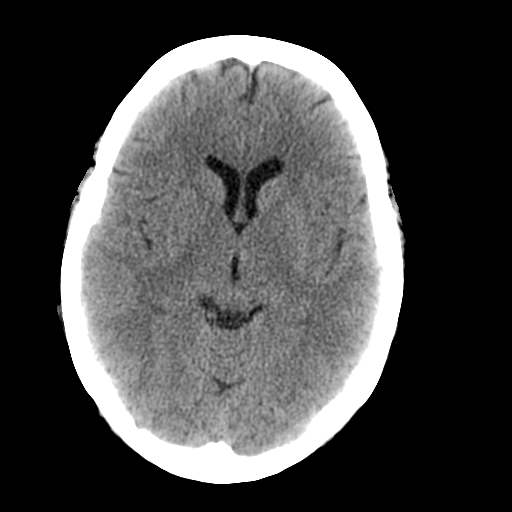
[im 15/32  brain]
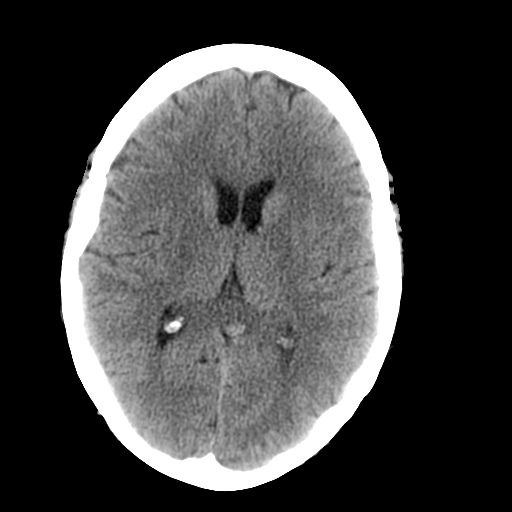
[im 17/32  brain]
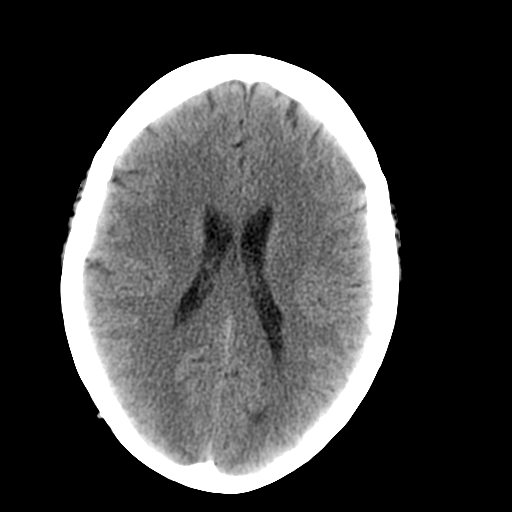
[im 17/32  bone]
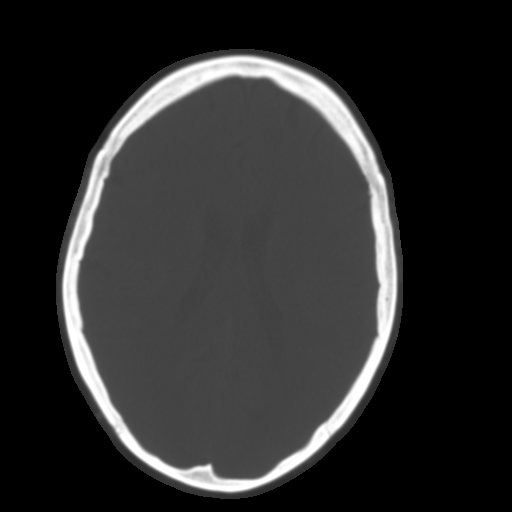
[im 19/32  brain]
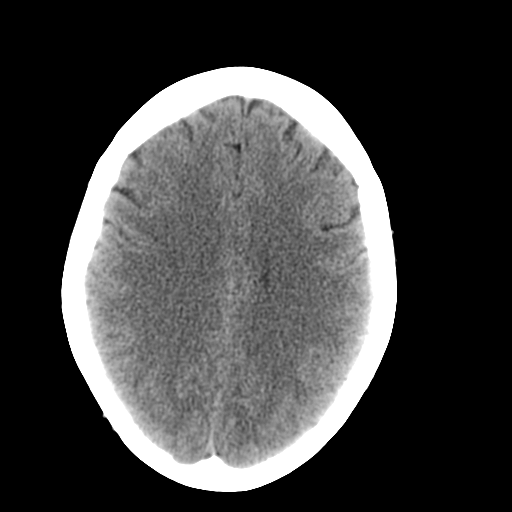
[im 21/32  brain]
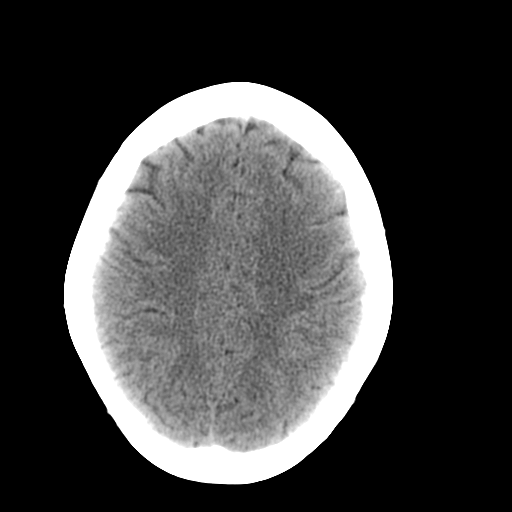
[im 23/32  brain]
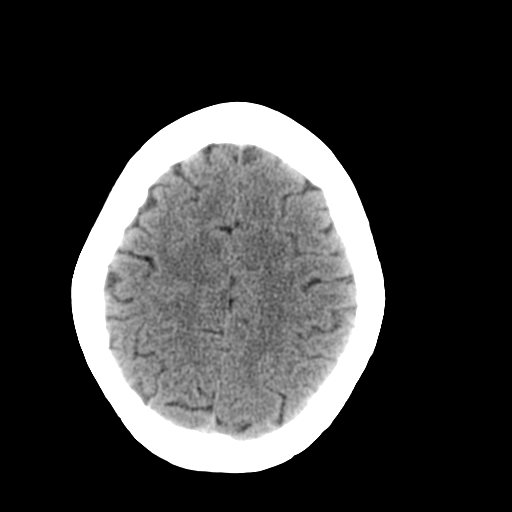
[im 24/32  brain]
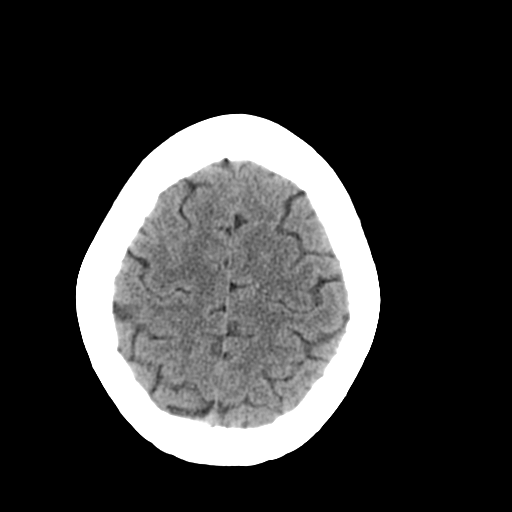
[im 24/32  bone]
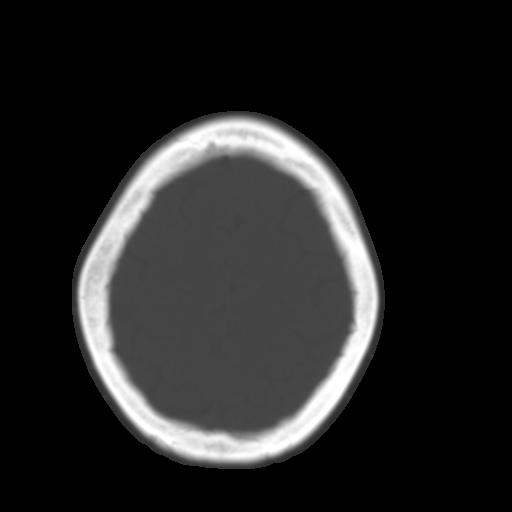
[im 26/32  brain]
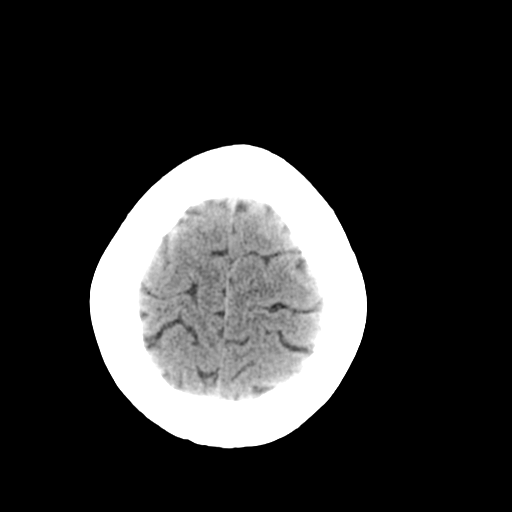
[im 28/32  brain]
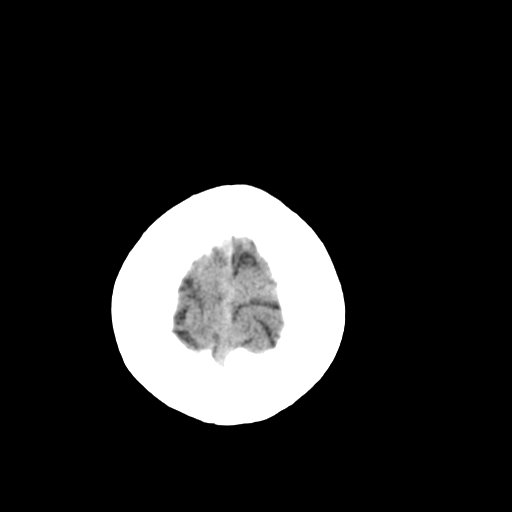
[im 30/32  brain]
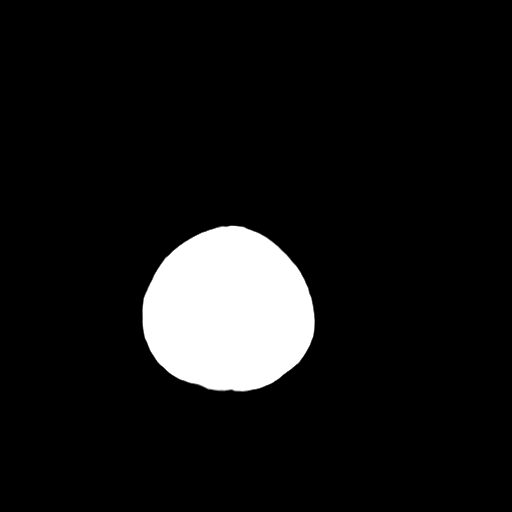

[16 of 30 positions shown; findings below may reference images not displayed]

FINDINGS: The ventricles are normal.  No extra-axial fluid
collections are seen.  The brainstem and cerebellum are
unremarkable.  No acute intracranial findings such as infarction or
hemorrhage.  No mass lesions.

The bony calvarium is intact.  The visualized paranasal sinuses and
mastoid air cells are clear.
IMPRESSION: No acute intracranial findings or mass lesions.

## 2010-06-03 ENCOUNTER — Ambulatory Visit: Payer: Self-pay | Admitting: Internal Medicine

## 2010-06-03 ENCOUNTER — Telehealth: Payer: Self-pay | Admitting: Internal Medicine

## 2010-06-05 ENCOUNTER — Telehealth: Payer: Self-pay | Admitting: Internal Medicine

## 2010-06-08 LAB — CONVERTED CEMR LAB
ALT: 14 units/L (ref 0–35)
AST: 17 units/L (ref 0–37)
Albumin: 4.3 g/dL (ref 3.5–5.2)
Alkaline Phosphatase: 81 units/L (ref 39–117)
BUN: 13 mg/dL (ref 6–23)
Basophils Absolute: 0.1 10*3/uL (ref 0.0–0.1)
Basophils Relative: 0.3 % (ref 0.0–3.0)
Bilirubin, Direct: 0.2 mg/dL (ref 0.0–0.3)
CO2: 32 meq/L (ref 19–32)
Calcium: 9.6 mg/dL (ref 8.4–10.5)
Chloride: 95 meq/L — ABNORMAL LOW (ref 96–112)
Creatinine, Ser: 0.6 mg/dL (ref 0.4–1.2)
Eosinophils Absolute: 0.1 10*3/uL (ref 0.0–0.7)
Eosinophils Relative: 0.7 % (ref 0.0–5.0)
GFR calc non Af Amer: 123.93 mL/min (ref >60)
Glucose, Bld: 150 mg/dL — ABNORMAL HIGH (ref 70–99)
HCT: 39.1 % (ref 36.0–46.0)
Hemoglobin: 13.6 g/dL (ref 12.0–15.0)
Lymphocytes Relative: 18.5 % (ref 12.0–46.0)
Lymphs Abs: 3.5 10*3/uL (ref 0.7–4.0)
MCHC: 34.8 g/dL (ref 30.0–36.0)
MCV: 95.2 fL (ref 78.0–100.0)
Monocytes Absolute: 0.8 10*3/uL (ref 0.1–1.0)
Monocytes Relative: 4.5 % (ref 3.0–12.0)
Neutro Abs: 14.2 10*3/uL — ABNORMAL HIGH (ref 1.4–7.7)
Neutrophils Relative %: 76 % (ref 43.0–77.0)
Platelets: 367 10*3/uL (ref 150.0–400.0)
Potassium: 4.4 meq/L (ref 3.5–5.1)
RBC: 4.1 M/uL (ref 3.87–5.11)
RDW: 12.4 % (ref 11.5–14.6)
Sodium: 141 meq/L (ref 135–145)
TSH: 1.17 microintl units/mL (ref 0.35–5.50)
Total Bilirubin: 0.7 mg/dL (ref 0.3–1.2)
Total Protein: 7.5 g/dL (ref 6.0–8.3)
WBC: 18.9 10*3/uL (ref 4.5–10.5)

## 2010-06-17 ENCOUNTER — Ambulatory Visit: Payer: Self-pay | Admitting: Critical Care Medicine

## 2010-06-22 ENCOUNTER — Ambulatory Visit: Payer: Self-pay | Admitting: Internal Medicine

## 2010-06-22 LAB — CONVERTED CEMR LAB
Basophils Absolute: 0 10*3/uL (ref 0.0–0.1)
Basophils Relative: 0.3 % (ref 0.0–3.0)
Eosinophils Absolute: 0.3 10*3/uL (ref 0.0–0.7)
Eosinophils Relative: 2.1 % (ref 0.0–5.0)
HCT: 36.1 % (ref 36.0–46.0)
Hemoglobin: 12.5 g/dL (ref 12.0–15.0)
Lymphocytes Relative: 26.9 % (ref 12.0–46.0)
Lymphs Abs: 3.9 10*3/uL (ref 0.7–4.0)
MCHC: 34.5 g/dL (ref 30.0–36.0)
MCV: 95 fL (ref 78.0–100.0)
Monocytes Absolute: 0.7 10*3/uL (ref 0.1–1.0)
Monocytes Relative: 5.1 % (ref 3.0–12.0)
Neutro Abs: 9.6 10*3/uL — ABNORMAL HIGH (ref 1.4–7.7)
Neutrophils Relative %: 65.6 % (ref 43.0–77.0)
Platelets: 299 10*3/uL (ref 150.0–400.0)
RBC: 3.8 M/uL — ABNORMAL LOW (ref 3.87–5.11)
RDW: 12.7 % (ref 11.5–14.6)
WBC: 14.6 10*3/uL — ABNORMAL HIGH (ref 4.5–10.5)

## 2010-07-15 ENCOUNTER — Ambulatory Visit: Payer: Self-pay | Admitting: Vascular Surgery

## 2010-07-15 ENCOUNTER — Encounter (INDEPENDENT_AMBULATORY_CARE_PROVIDER_SITE_OTHER): Payer: Self-pay | Admitting: Internal Medicine

## 2010-07-15 ENCOUNTER — Observation Stay (HOSPITAL_COMMUNITY): Admission: EM | Admit: 2010-07-15 | Discharge: 2010-07-20 | Payer: Self-pay | Admitting: Emergency Medicine

## 2010-07-16 ENCOUNTER — Encounter: Payer: Self-pay | Admitting: Cardiovascular Disease

## 2010-07-16 ENCOUNTER — Encounter (INDEPENDENT_AMBULATORY_CARE_PROVIDER_SITE_OTHER): Payer: Self-pay | Admitting: Internal Medicine

## 2010-07-20 ENCOUNTER — Other Ambulatory Visit: Payer: Self-pay | Admitting: Cardiovascular Disease

## 2010-07-20 HISTORY — PX: LEFT HEART CATH AND CORONARY ANGIOGRAPHY: CATH118249

## 2010-07-20 IMAGING — CR DG LUMBAR SPINE COMPLETE 4+V
5 series · 5 of 5 positions shown · non-contrast
Comparison: None

CLINICAL DATA: Low back pain

LUMBAR SPINE - COMPLETE 4+ VIEW

[view not recorded (1 of 5)]
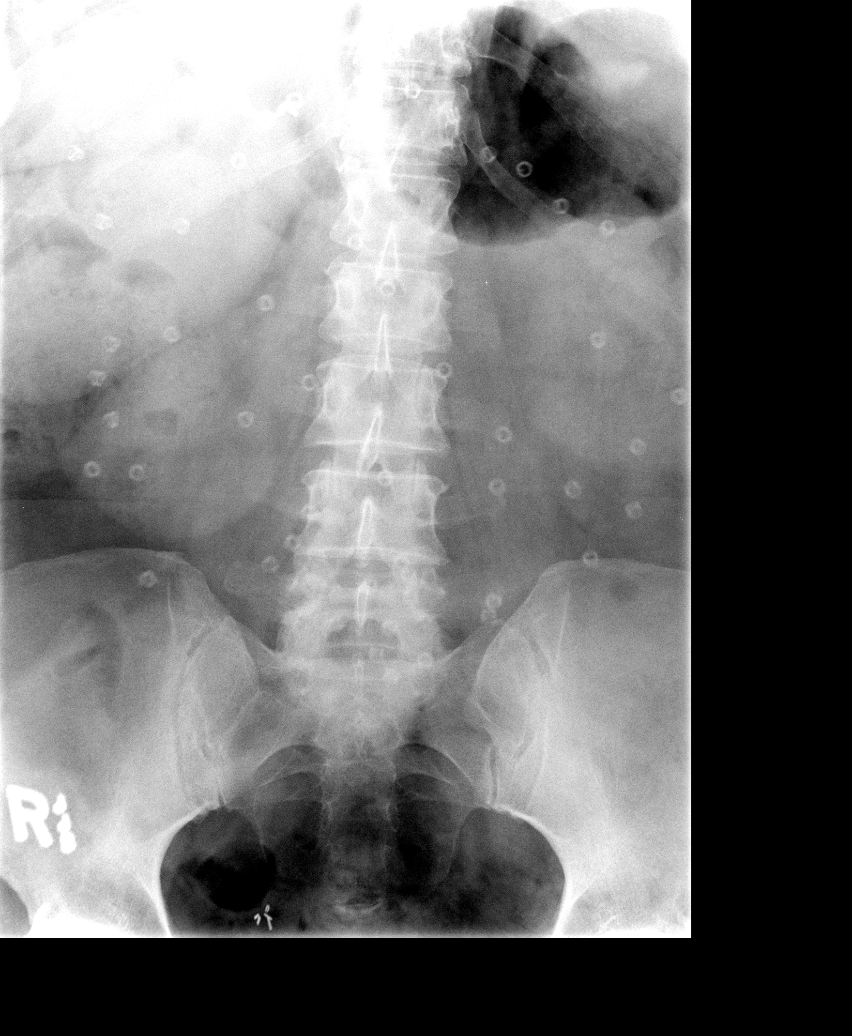

[view not recorded (2 of 5)]
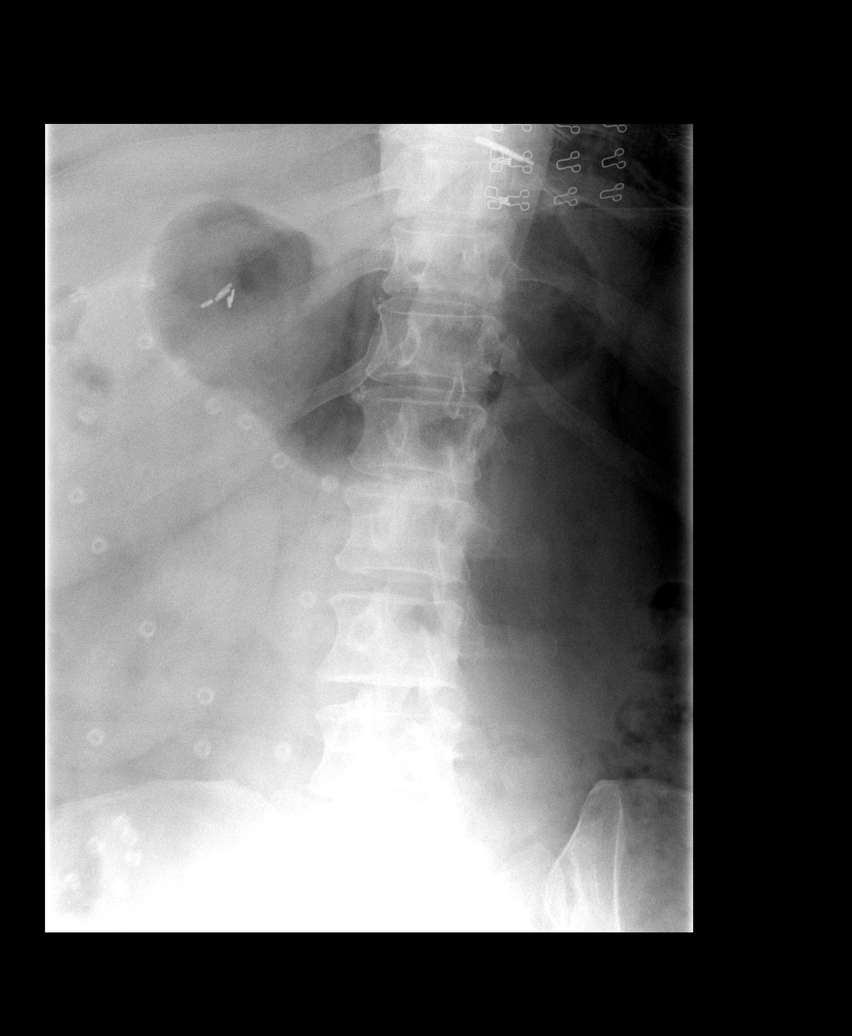

[view not recorded (3 of 5)]
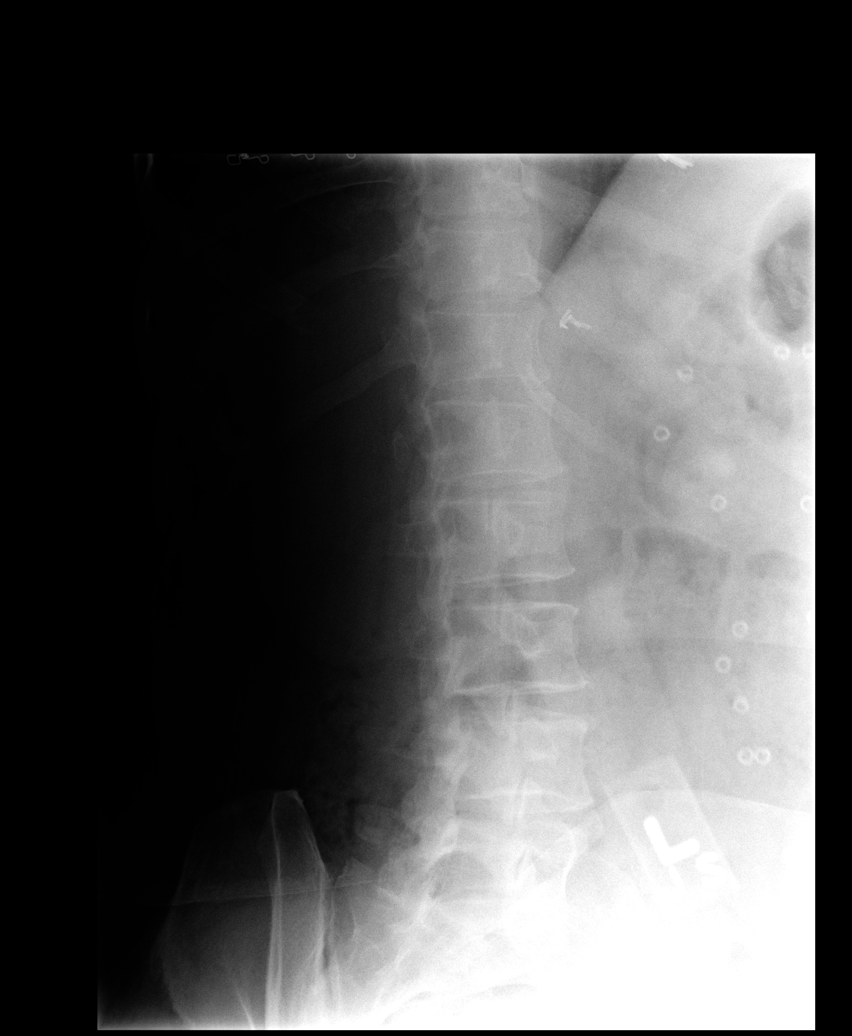

[view not recorded (4 of 5)]
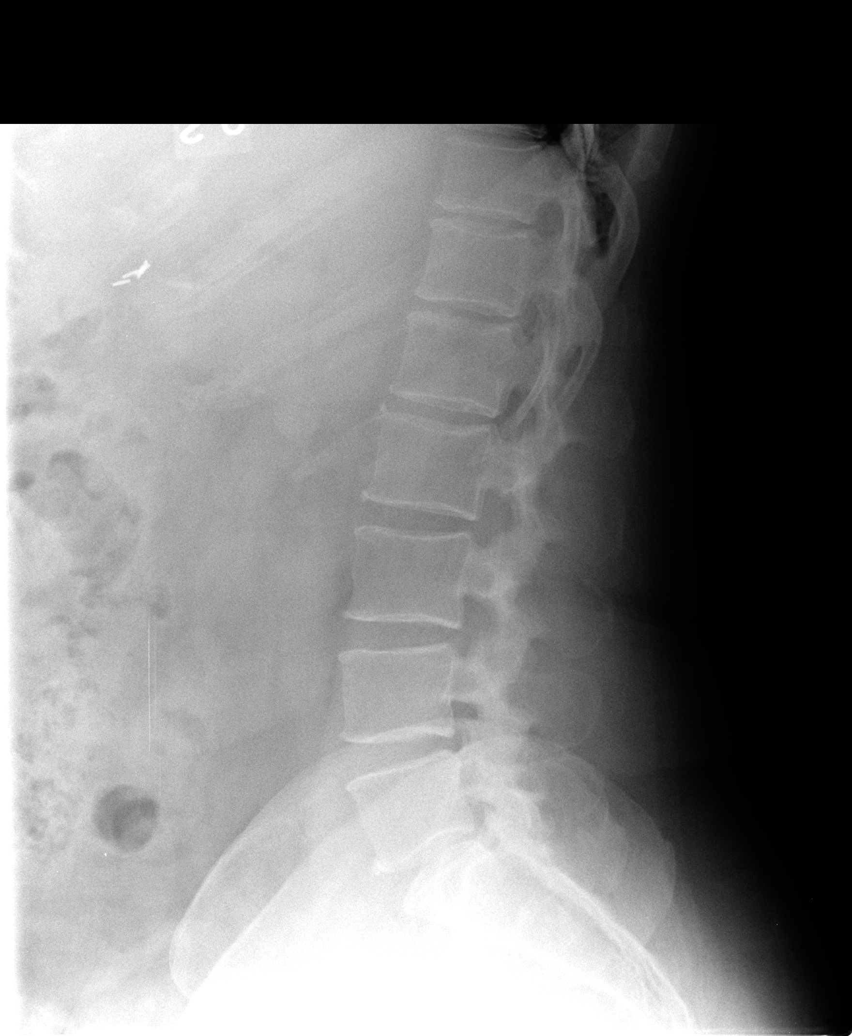

[view not recorded (5 of 5)]
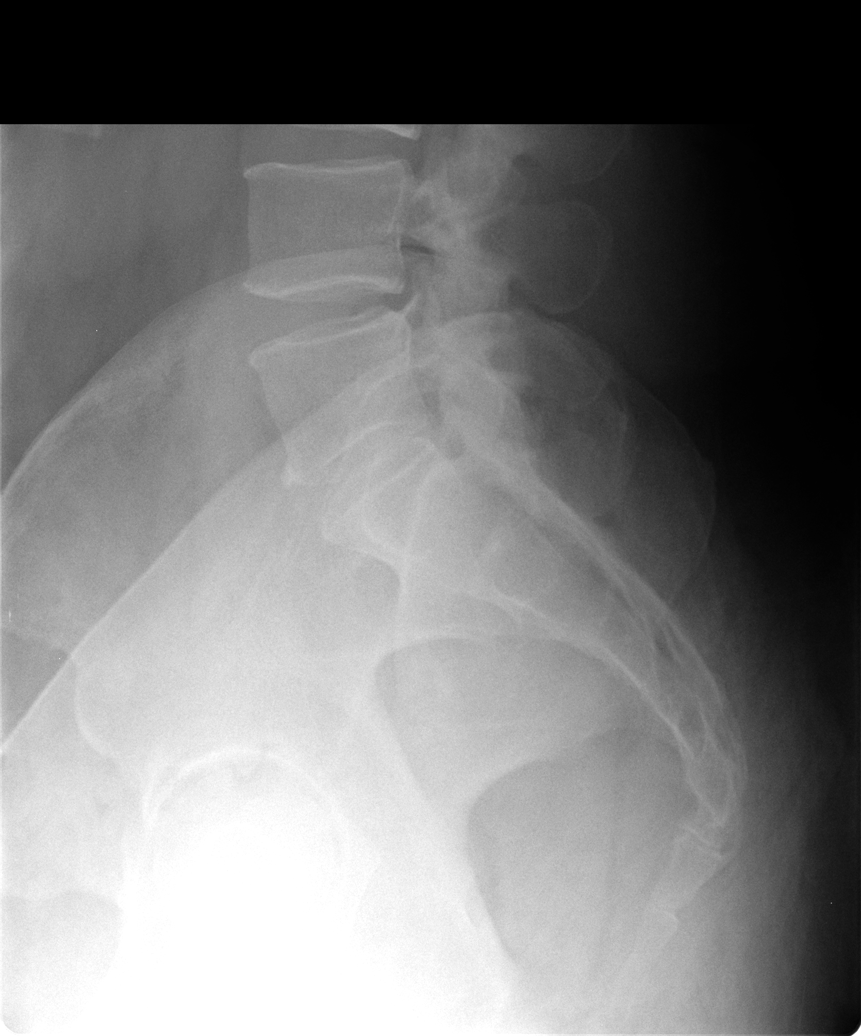

[5 of 5 positions shown; findings below may reference images not displayed]

FINDINGS: There is no evidence of lumbar spine fracture.  Alignment
is normal.  Intervertebral disc spaces are maintained.
IMPRESSION: Negative.

## 2010-07-21 LAB — CONVERTED CEMR LAB: Hgb A1c MFr Bld: 7.1 %

## 2010-07-21 IMAGING — CR DG ABDOMEN ACUTE W/ 1V CHEST
4 series · 4 of 4 positions shown · non-contrast
Comparison: CT of the chest, abdomen and pelvis performed
04/28/2009

CLINICAL DATA: Mid abdominal pain, nausea, vomiting and diarrhea.

ACUTE ABDOMEN SERIES (ABDOMEN 2 VIEW & CHEST 1 VIEW)

[w chest pa]
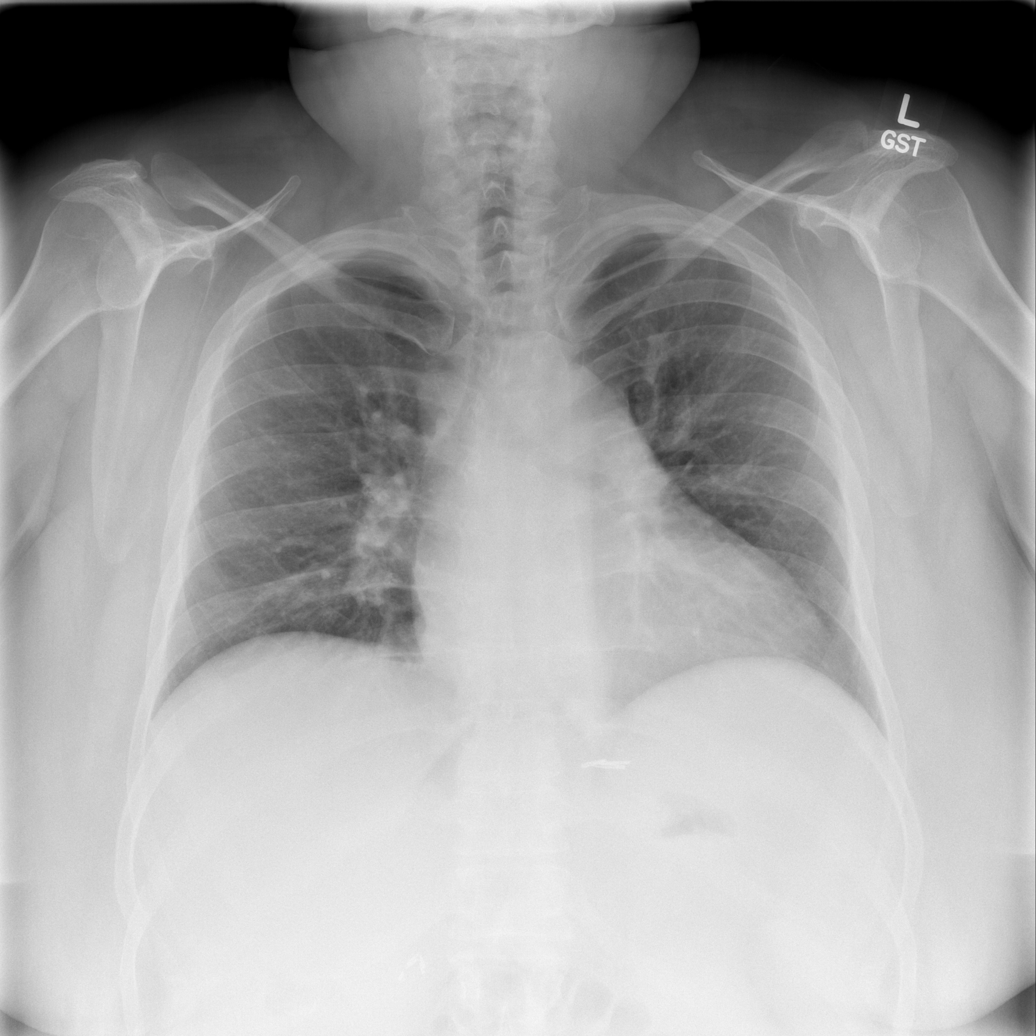

[w abdomen upright]
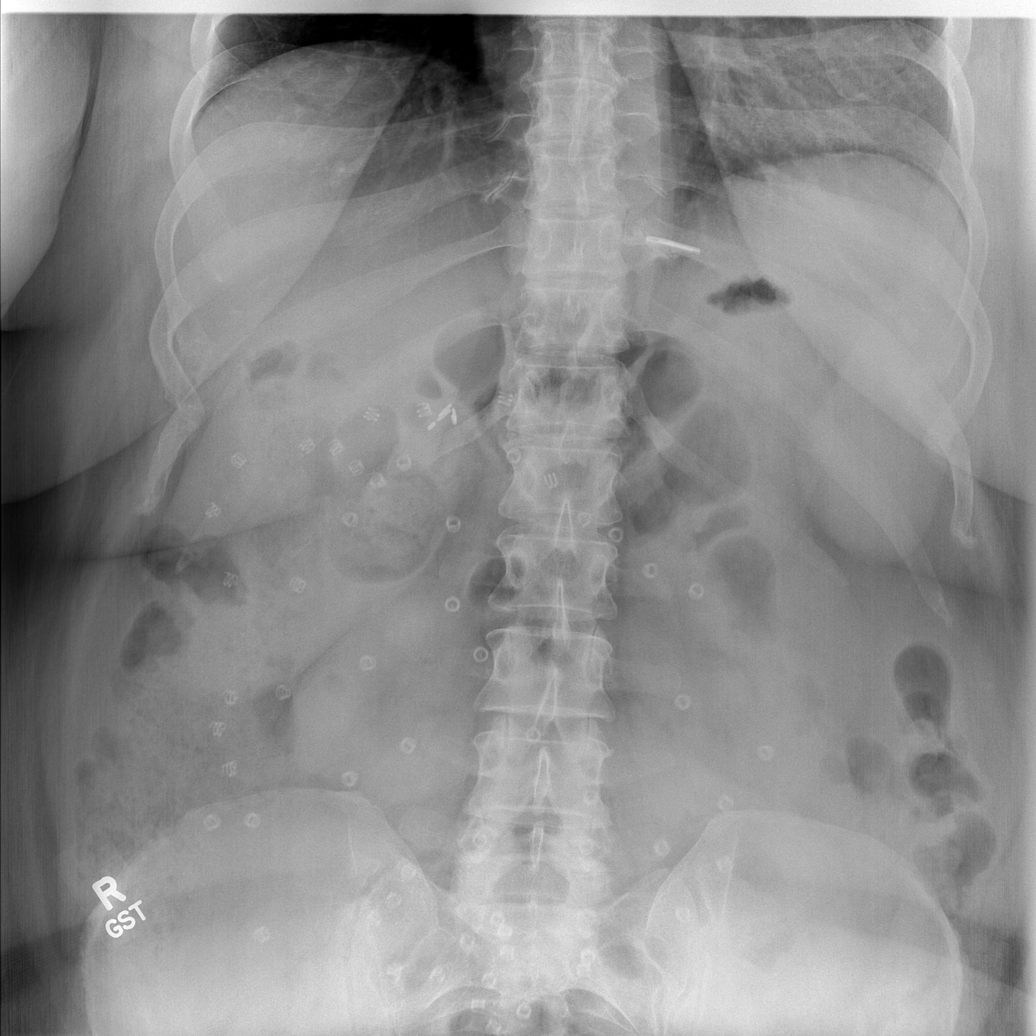

[t abdomen supine (1 of 2)]
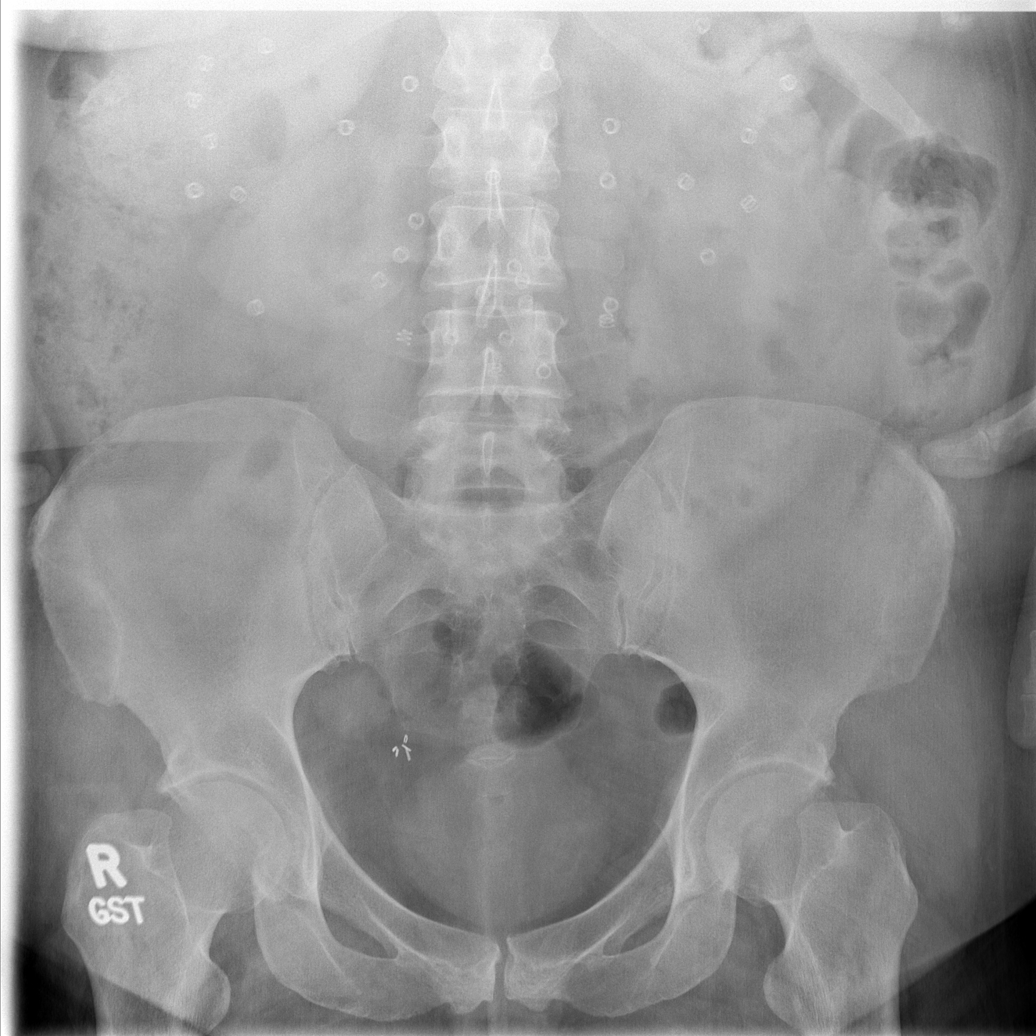

[t abdomen supine (2 of 2)]
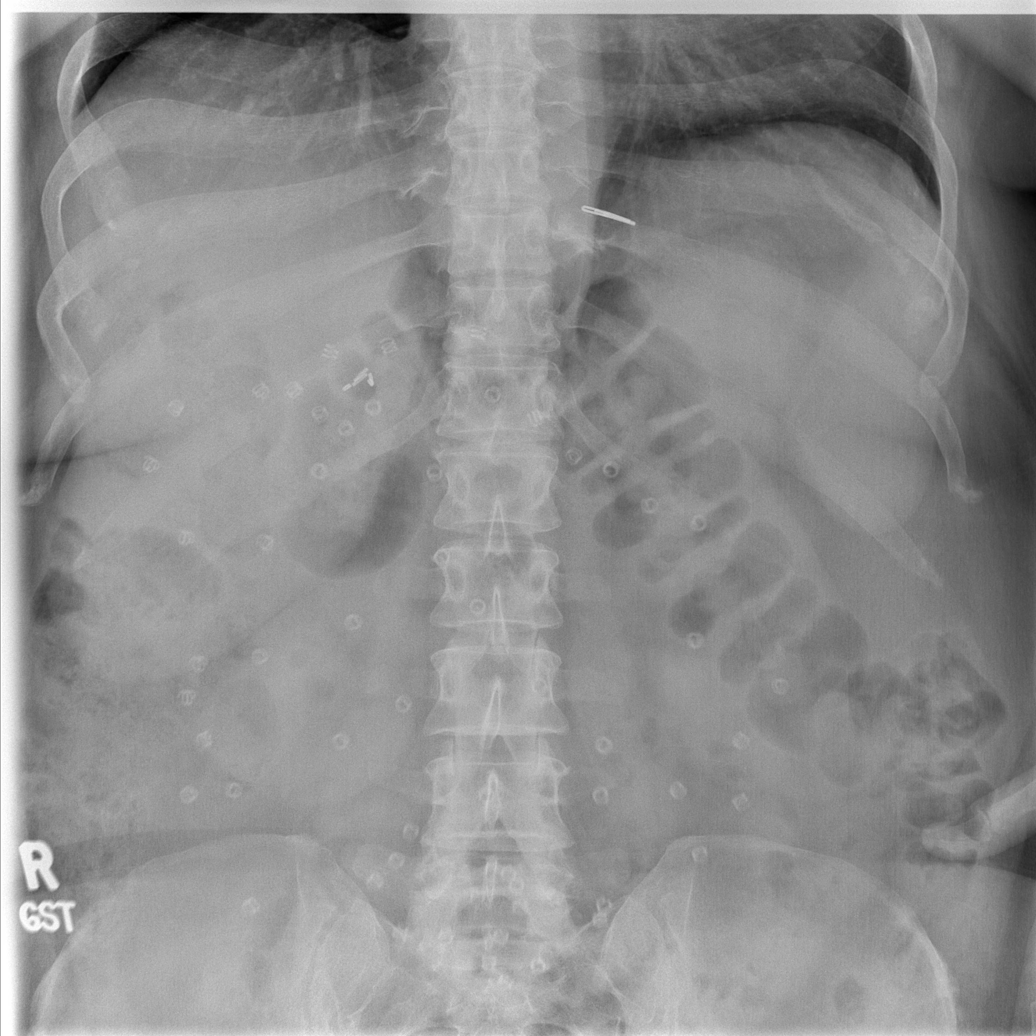

[4 of 4 positions shown; findings below may reference images not displayed]

FINDINGS: The lungs are mildly hypoexpanded but appear clear.
There is no evidence of focal opacification, pleural effusion or
pneumothorax.  The cardiomediastinal silhouette is within normal
limits. Clips are noted at the gastroesophageal junction.

The visualized bowel gas pattern is unremarkable. Stool and air are
noted throughout the colon; there is no evidence of small bowel
dilatation to suggest obstruction.  Clips are noted within the
right upper quadrant, reflecting prior cholecystectomy. Multiple
abdominal wall meshes are again identified.  No free intra-
abdominal air is identified on the provided upright view.

No acute osseous abnormalities are seen; the sacroiliac joints are
unremarkable in appearance.
IMPRESSION: 1.  Unremarkable bowel gas pattern; no free intra-abdominal air
seen.
2.  Mildly hypoexpanded but clear lungs.

## 2010-07-21 IMAGING — CT CT ABDOMEN W/ CM
3 of 5 series · 14 of 32 positions shown, 18 images · IV contrast (water & 100 ML OMNI 300)
Comparison: Abdominal radiograph performed earlier today at [DATE]
a.m., and CT of the abdomen and pelvis performed 04/28/2009

CT ABDOMEN

CLINICAL DATA: Abdominal pain, nausea and vomiting.

CT ABDOMEN AND PELVIS WITH CONTRAST
TECHNIQUE: Multidetector CT imaging of the abdomen and pelvis was
performed using the standard protocol following bolus
administration of intravenous contrast.
Contrast: 100 mL Omnipaque 300 IV contrast

[Series 2: routine abdomen · axial · 0.86mm/px · z∈[-450,-195]mm · 3 of 99 slices shown, 7 images]
[im 25/99  soft-tissue]
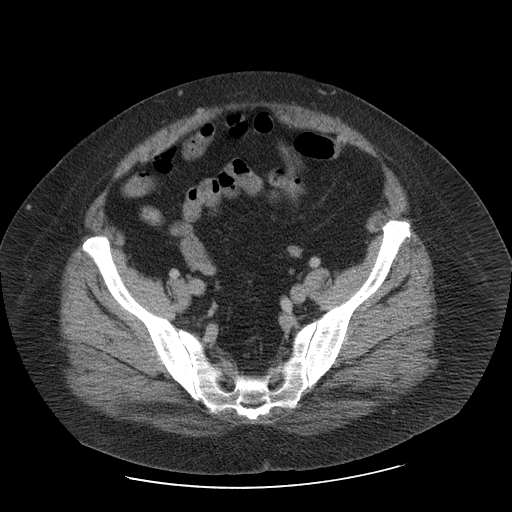
[im 25/99  lung]
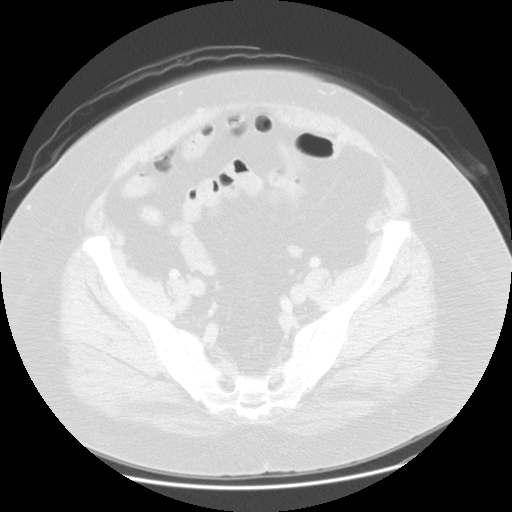
[im 25/99  bone]
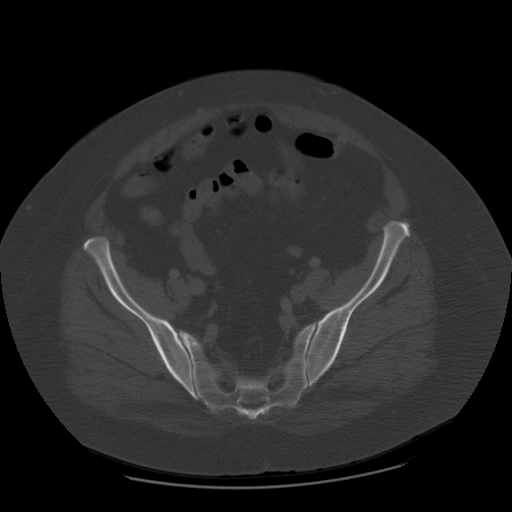
[im 50/99  soft-tissue]
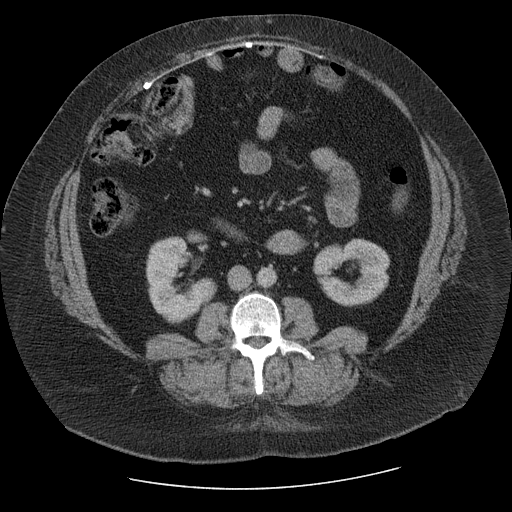
[im 50/99  lung]
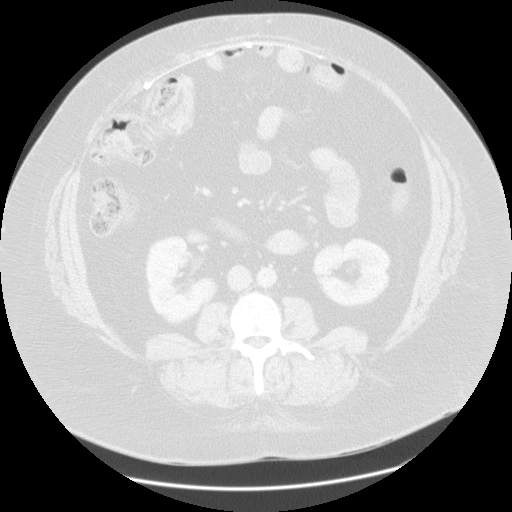
[im 74/99  soft-tissue]
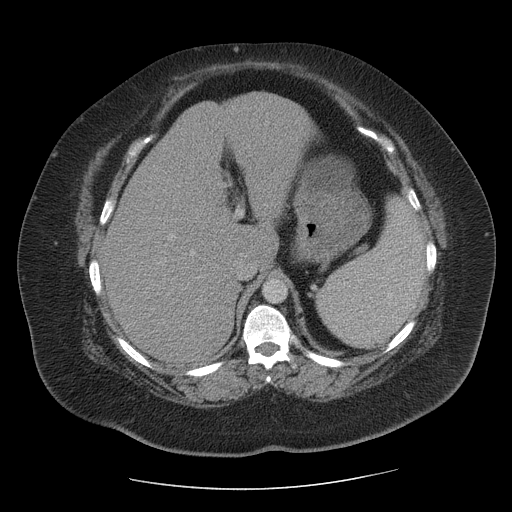
[im 74/99  lung]
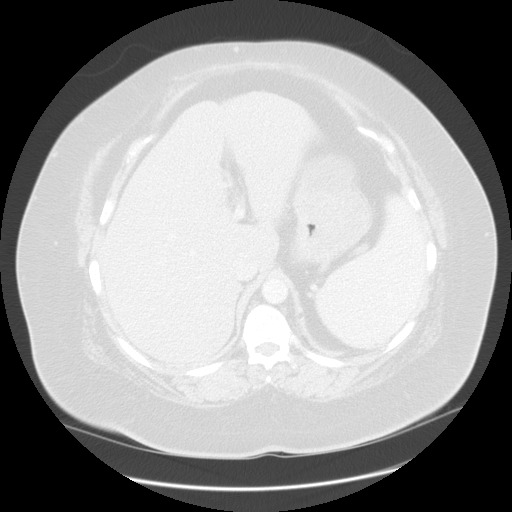

[Series 400: reformatted · sagittal · 1.00mm/px · 8 of 216 slices shown (1 of 2)]
[im 20/216  soft-tissue]
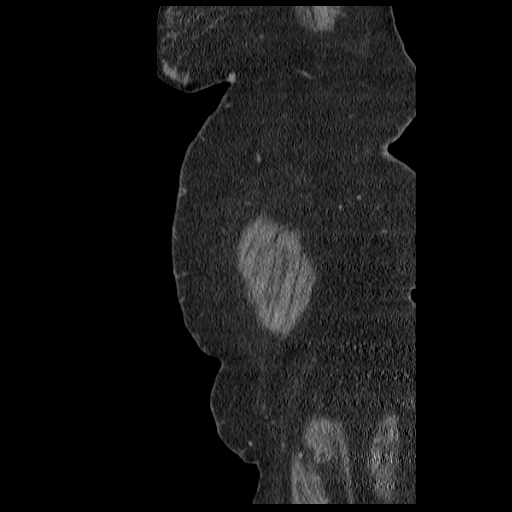
[im 40/216  soft-tissue]
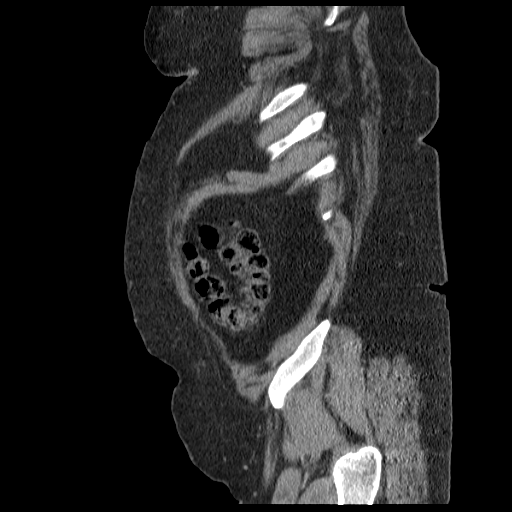
[im 79/216  soft-tissue]
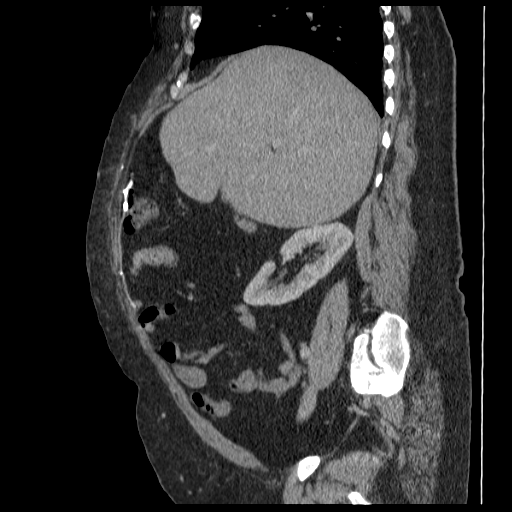
[im 98/216  soft-tissue]
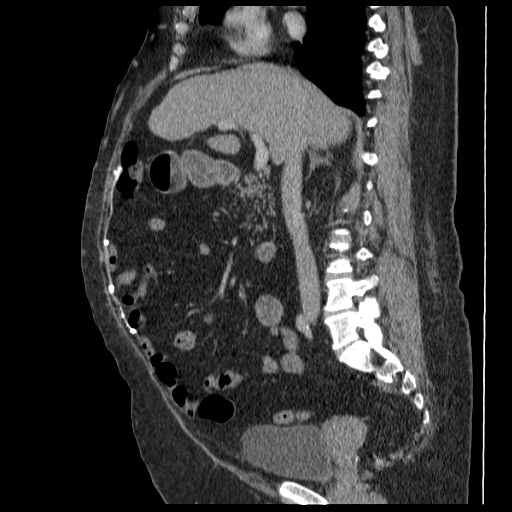
[im 118/216  soft-tissue]
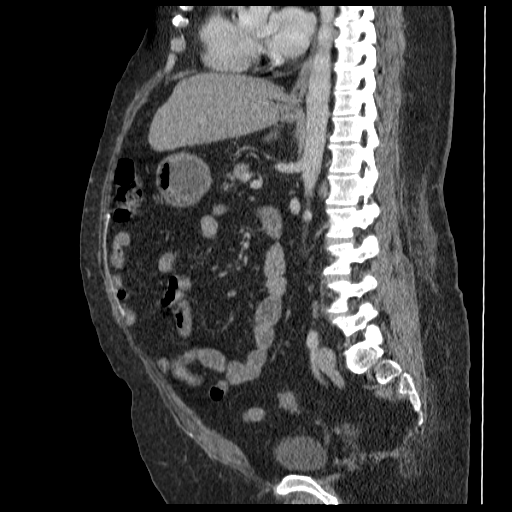
[im 137/216  soft-tissue]
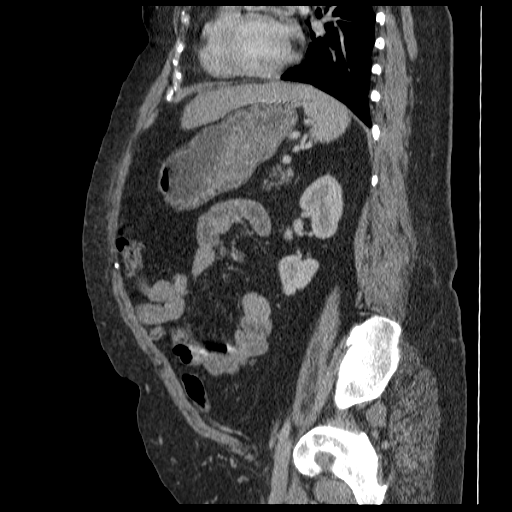
[im 176/216  soft-tissue]
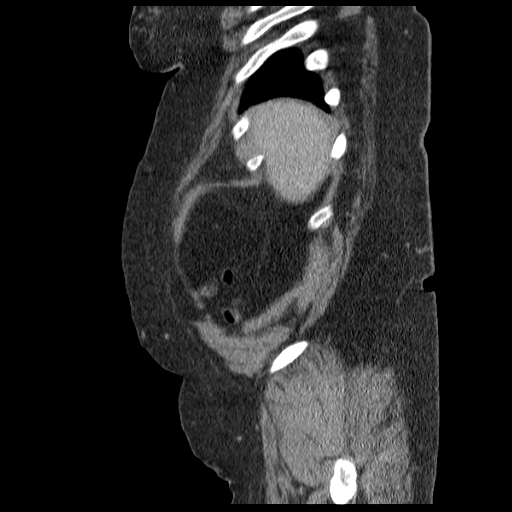
[im 196/216  soft-tissue]
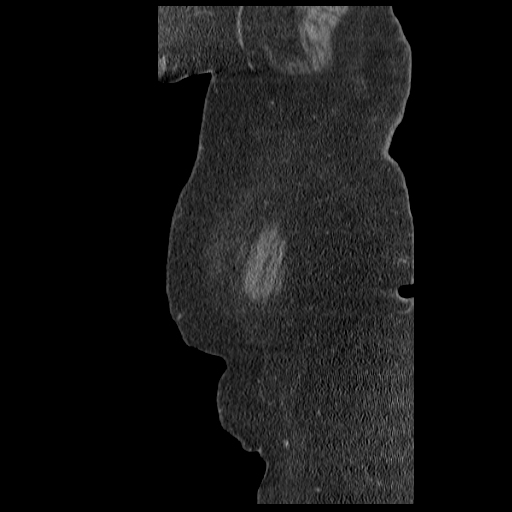

[Series 401: reformatted · coronal · 1.00mm/px · 3 of 208 slices shown (2 of 2)]
[im 21/208  soft-tissue]
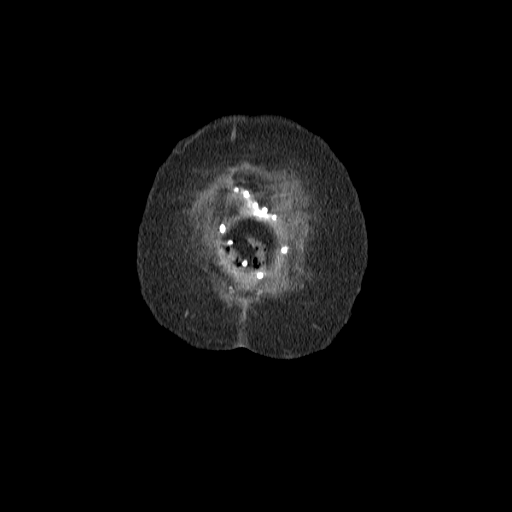
[im 42/208  soft-tissue]
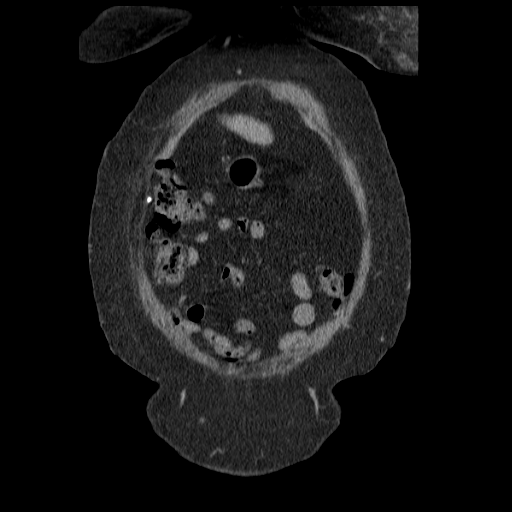
[im 63/208  soft-tissue]
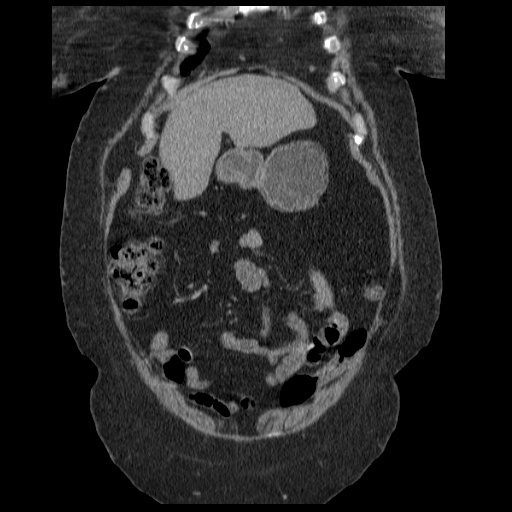

[14 of 32 positions shown; findings below may reference images not displayed]

FINDINGS: The visualized lung bases are clear.

The liver and spleen are unremarkable in appearance.  The patient
is status post cholecystectomy, with clips at the gallbladder
fossa.  The pancreas and adrenal glands are unremarkable.  Mild
bilateral renal scarring is noted; the kidneys are otherwise within
normal limits.  No hydronephrosis or perinephric stranding is seen.

No free fluid is identified.  The small bowel is unremarkable in
appearance.  Clips are noted at the gastroesophageal junction; the
stomach is within normal limits.  No acute vascular abnormalities
are seen. A right anterolateral abdominal wall hernia is noted
overlying the liver, with associated herniated omental fat. An
anterior abdominal wall mesh is noted more inferiorly, near the
midline.

No acute osseous abnormalities are identified.
IMPRESSION: 1.  Right anterolateral abdominal wall hernia overlying the liver,
with associated herniated omental fat.
2.  Anterior abdominal wall mesh noted near the midline, without
evidence of recurrent hernia.
3.  Mild bilateral renal scarring.

CT PELVIS
FINDINGS: No free fluid is identified within the pelvis.  The colon
is unremarkable in appearance; the appendix is not seen, but there
is no evidence of acute appendicitis.

The bladder is mildly distended and within normal limits. The
uterus is unremarkable in appearance; the adnexa are within normal
limits.  No adnexal masses are seen.  Several clips are seen
adjacent to the distal right ureter; the right ureter is
unremarkable in appearance.  There is no evidence of inguinal
lymphadenopathy.

No acute osseous abnormalities are seen.
IMPRESSION: No acute abnormalities identified within the pelvis.

## 2010-07-23 ENCOUNTER — Ambulatory Visit: Payer: Self-pay | Admitting: Internal Medicine

## 2010-07-24 LAB — CONVERTED CEMR LAB
BUN: 16 mg/dL (ref 6–23)
CO2: 30 meq/L (ref 19–32)
Calcium: 9.6 mg/dL (ref 8.4–10.5)
Chloride: 95 meq/L — ABNORMAL LOW (ref 96–112)
Cholesterol: 212 mg/dL — ABNORMAL HIGH (ref 0–200)
Creatinine, Ser: 0.8 mg/dL (ref 0.4–1.2)
Direct LDL: 111.9 mg/dL
GFR calc non Af Amer: 77.04 mL/min (ref >60)
Glucose, Bld: 273 mg/dL — ABNORMAL HIGH (ref 70–99)
HDL: 46.6 mg/dL (ref >39.00)
Potassium: 4.3 meq/L (ref 3.5–5.1)
Sodium: 136 meq/L (ref 135–145)
Total CHOL/HDL Ratio: 5
Triglycerides: 491 mg/dL — ABNORMAL HIGH (ref 0.0–149.0)
VLDL: 98.2 mg/dL — ABNORMAL HIGH (ref 0.0–40.0)

## 2010-07-31 IMAGING — CR DG FOOT COMPLETE 3+V*R*
3 series · 3 of 3 positions shown · non-contrast
Comparison: None

CLINICAL DATA: Lateral right foot pain

RIGHT FOOT COMPLETE - 3+ VIEW

[view not recorded (1 of 3)]
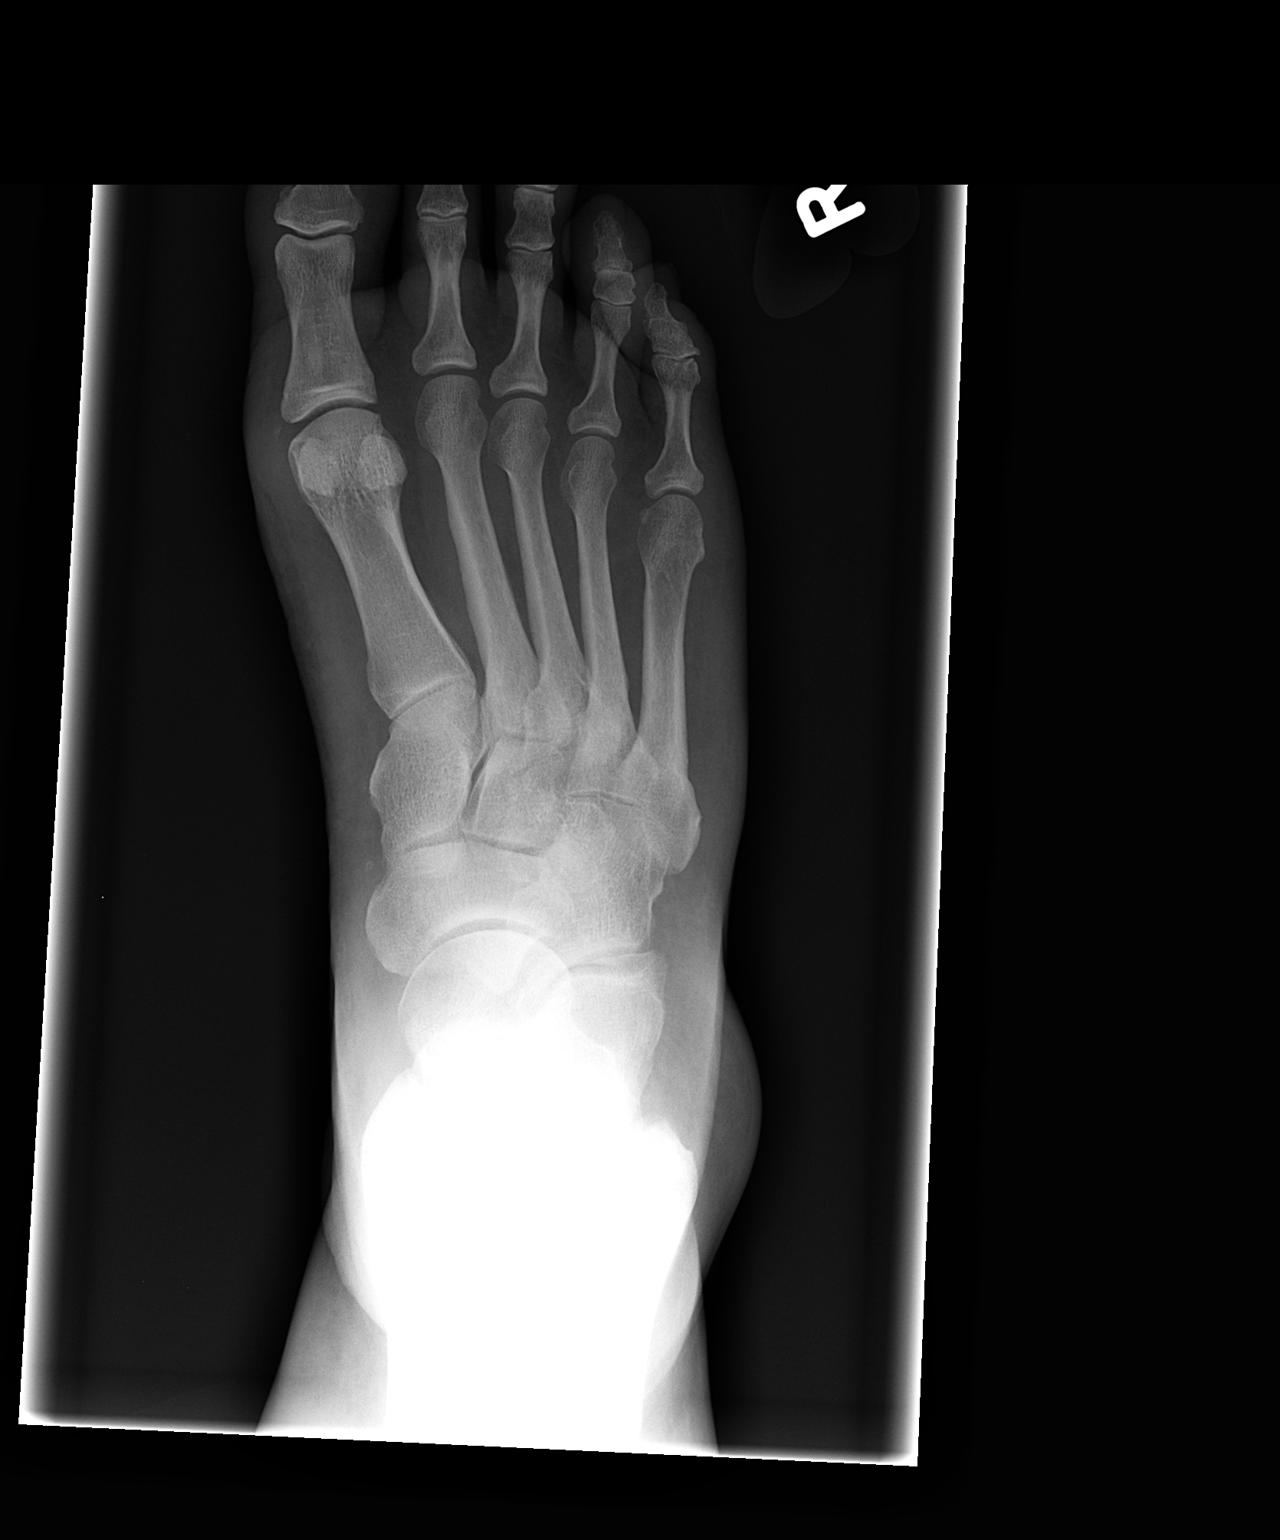

[view not recorded (2 of 3)]
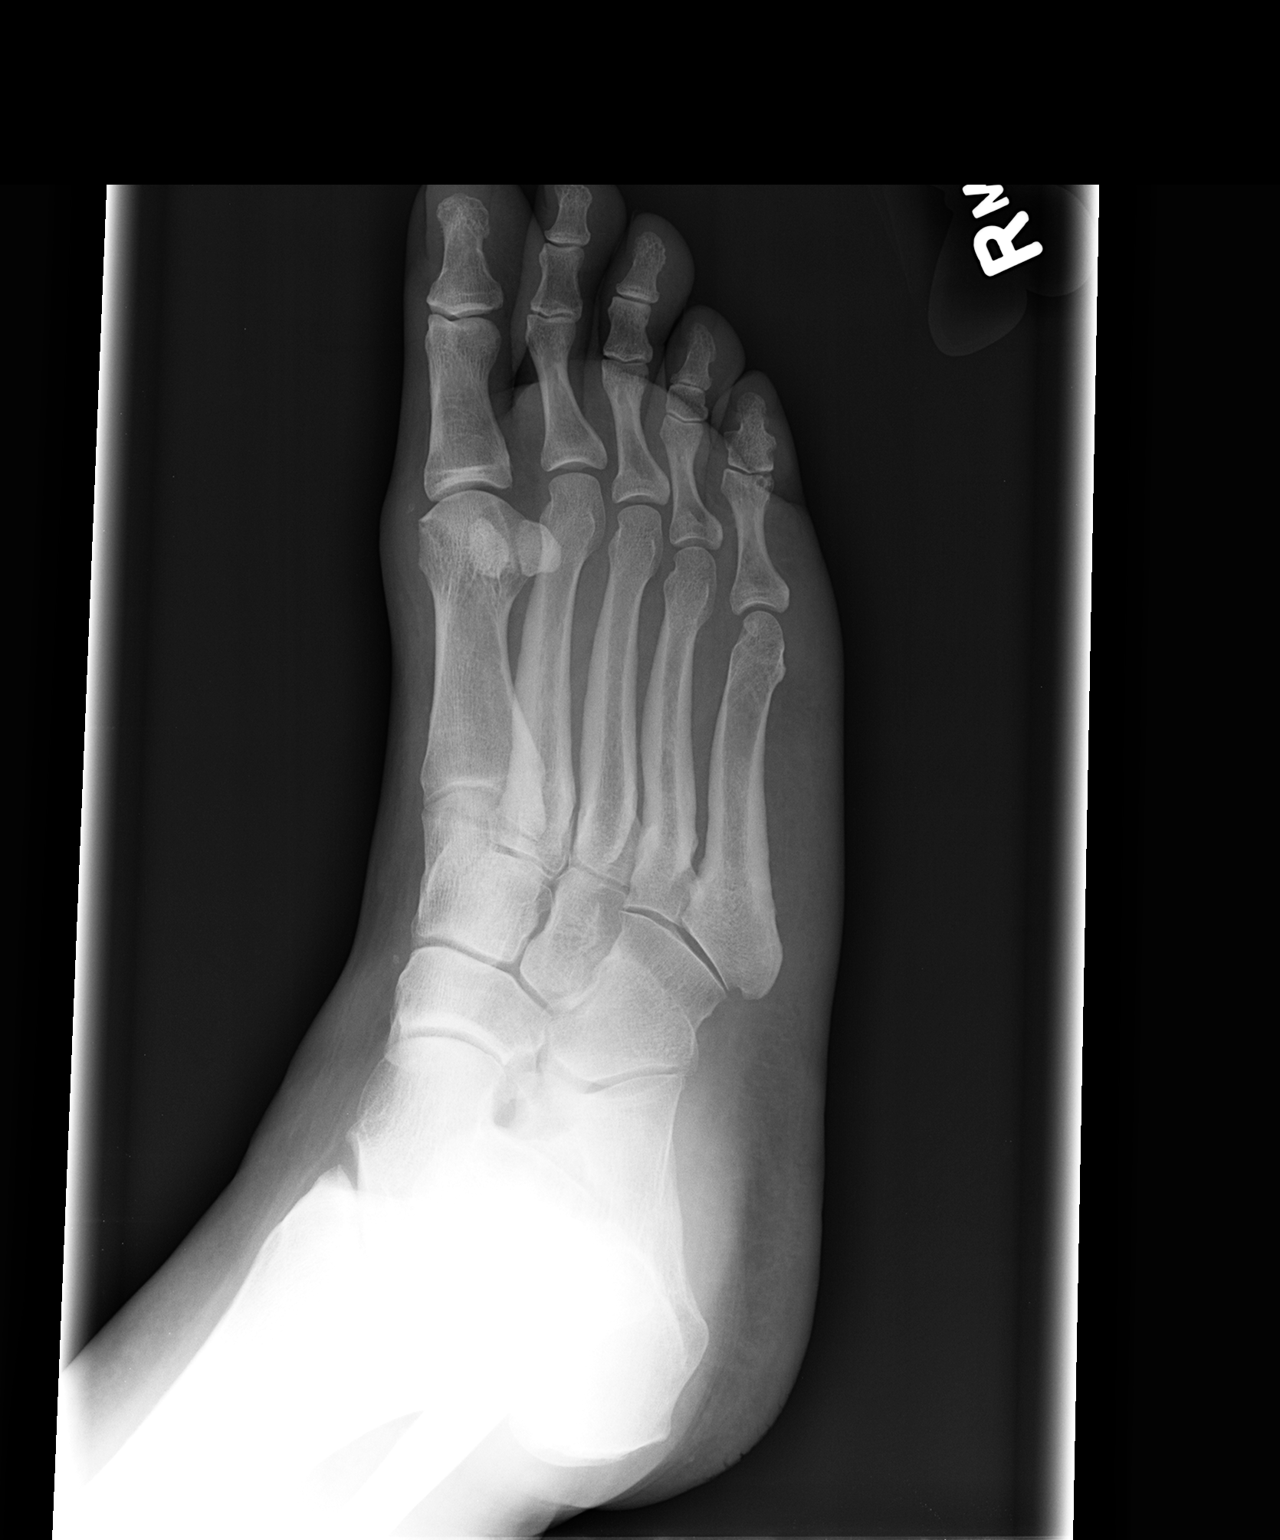

[view not recorded (3 of 3)]
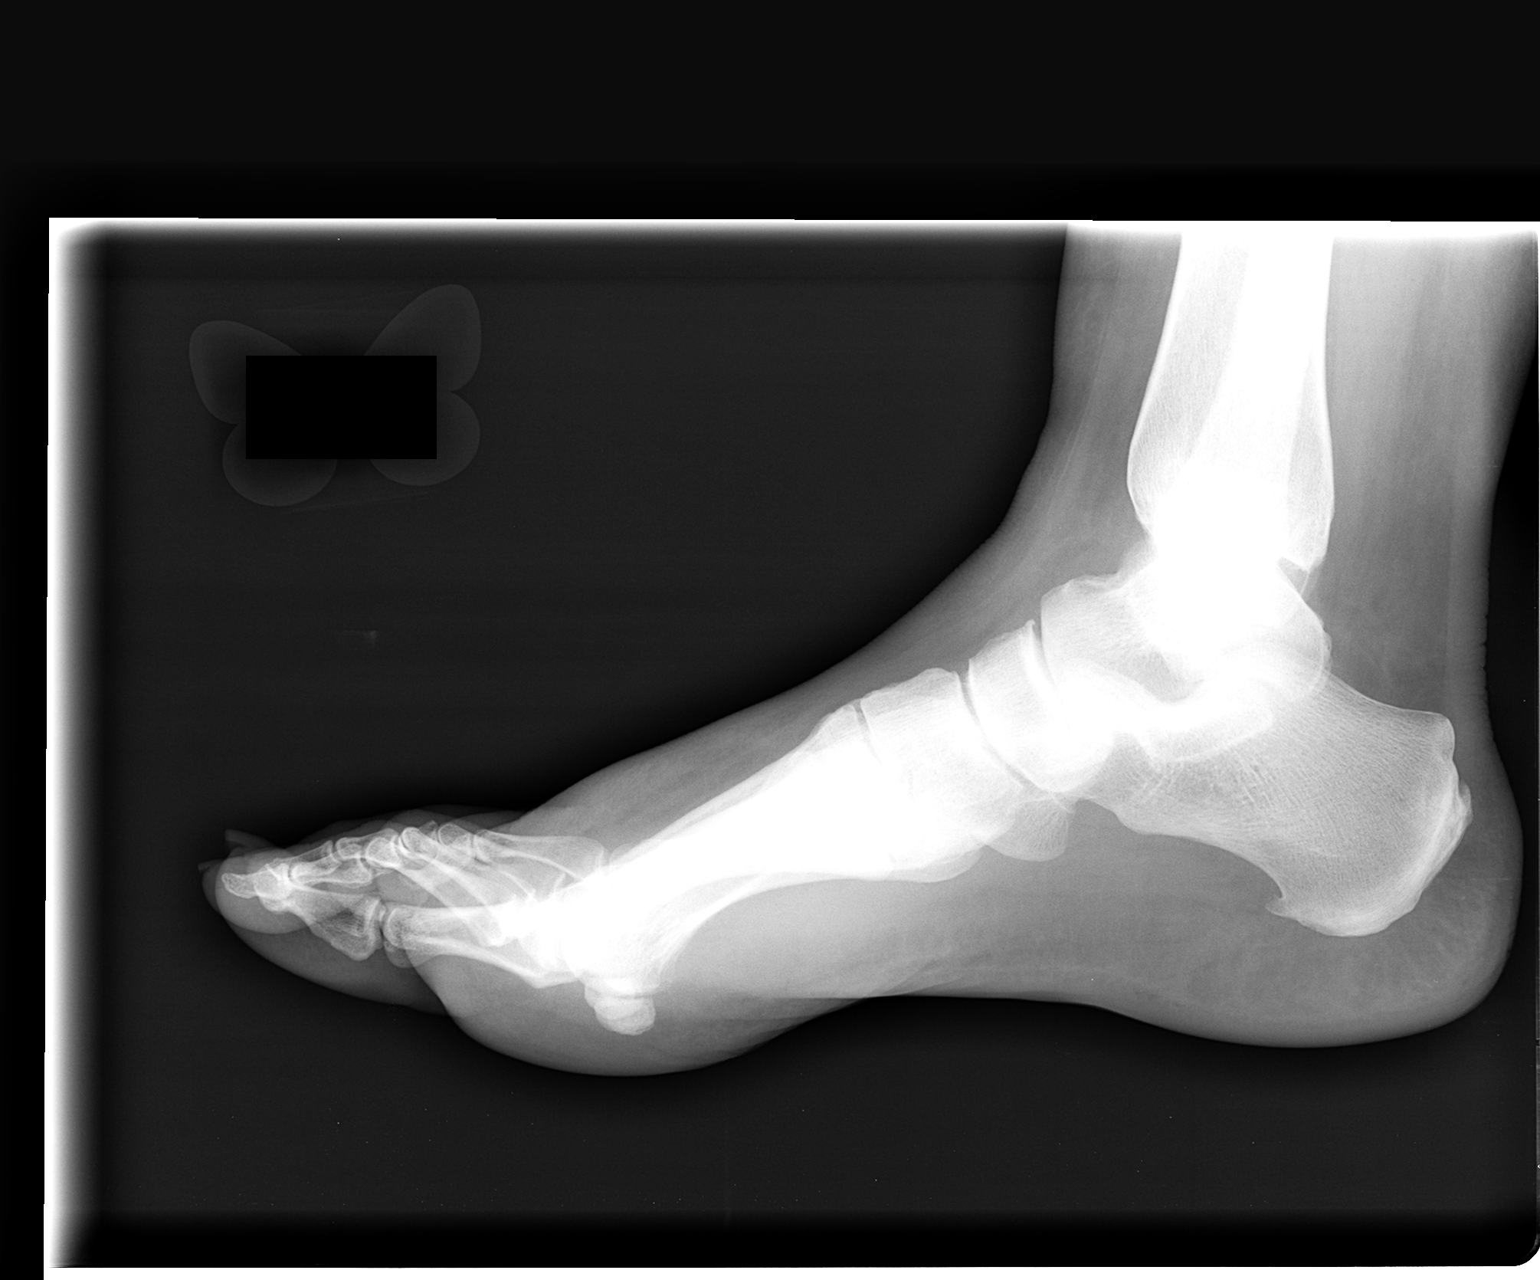

[3 of 3 positions shown; findings below may reference images not displayed]

FINDINGS: Tiny plantar calcaneal spur.
Dorsal soft tissue swelling of mid to distal foot.
Joint spaces preserved.
Bone mineralization grossly normal.
No acute fracture, dislocation, or bone destruction.
IMPRESSION: No acute bony abnormalities.

## 2010-08-01 ENCOUNTER — Emergency Department (HOSPITAL_COMMUNITY): Admission: EM | Admit: 2010-08-01 | Discharge: 2010-08-02 | Payer: Self-pay | Admitting: Emergency Medicine

## 2010-08-04 ENCOUNTER — Ambulatory Visit: Payer: Self-pay | Admitting: Critical Care Medicine

## 2010-08-05 ENCOUNTER — Ambulatory Visit: Payer: Self-pay | Admitting: Internal Medicine

## 2010-08-05 LAB — CONVERTED CEMR LAB
BUN: 12 mg/dL (ref 6–23)
Bilirubin Urine: NEGATIVE
Blood in Urine, dipstick: NEGATIVE
CO2: 32 meq/L (ref 19–32)
Calcium: 9.3 mg/dL (ref 8.4–10.5)
Chloride: 98 meq/L (ref 96–112)
Cortisol, Plasma: 7.5 ug/dL
Creatinine, Ser: 0.7 mg/dL (ref 0.4–1.2)
GFR calc non Af Amer: 95.33 mL/min (ref >60)
Glucose, Bld: 208 mg/dL — ABNORMAL HIGH (ref 70–99)
Glucose, Urine, Semiquant: NEGATIVE
Ketones, urine, test strip: NEGATIVE
Nitrite: NEGATIVE
Potassium: 3.8 meq/L (ref 3.5–5.1)
Protein, U semiquant: NEGATIVE
Sodium: 138 meq/L (ref 135–145)
Specific Gravity, Urine: 1.015
Urobilinogen, UA: 0.2
WBC Urine, dipstick: NEGATIVE
pH: 7

## 2010-08-10 ENCOUNTER — Ambulatory Visit: Payer: Self-pay | Admitting: Internal Medicine

## 2010-08-11 ENCOUNTER — Ambulatory Visit: Payer: Self-pay | Admitting: Internal Medicine

## 2010-08-20 ENCOUNTER — Ambulatory Visit: Payer: Self-pay | Admitting: Internal Medicine

## 2010-08-20 DIAGNOSIS — M109 Gout, unspecified: Secondary | ICD-10-CM | POA: Insufficient documentation

## 2010-08-20 HISTORY — DX: Gout, unspecified: M10.9

## 2010-08-30 ENCOUNTER — Emergency Department (HOSPITAL_COMMUNITY): Admission: EM | Admit: 2010-08-30 | Discharge: 2010-08-30 | Payer: Self-pay | Admitting: Emergency Medicine

## 2010-09-07 IMAGING — CR DG ABDOMEN ACUTE W/ 1V CHEST
4 series · 4 of 4 positions shown · non-contrast
Comparison: 09/10/2009

CLINICAL DATA: Abdominal pain

ACUTE ABDOMEN SERIES (ABDOMEN 2 VIEW & CHEST 1 VIEW)

[w chest pa]
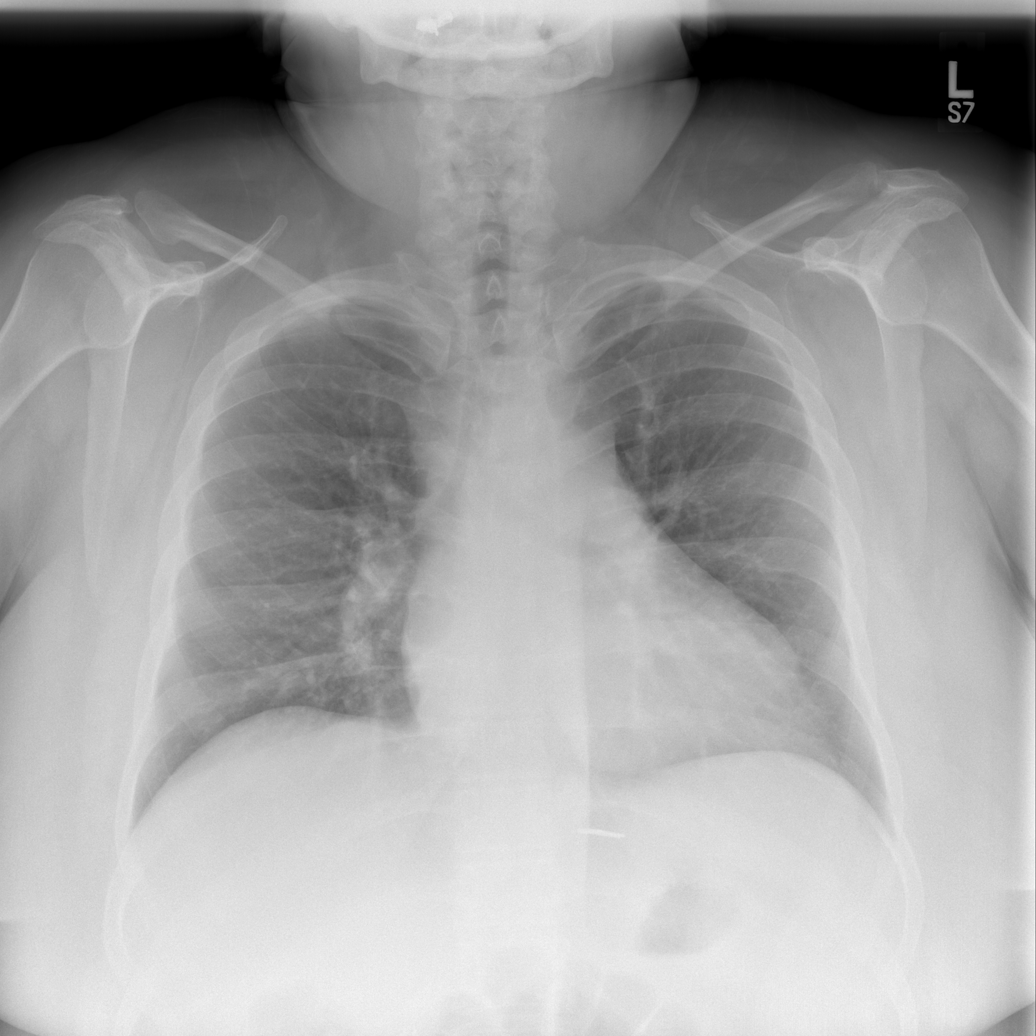

[w abdomen upright *]
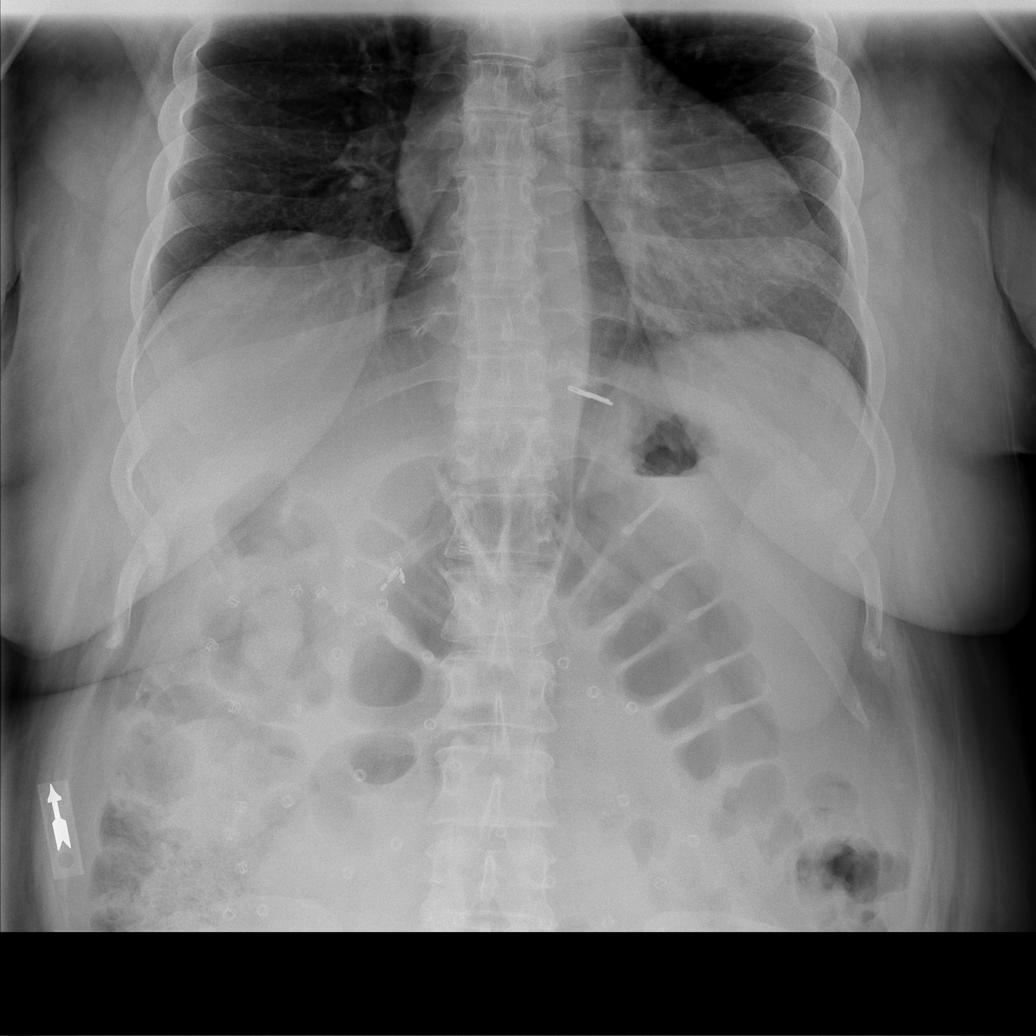

[t abdomen supine (1 of 2)]
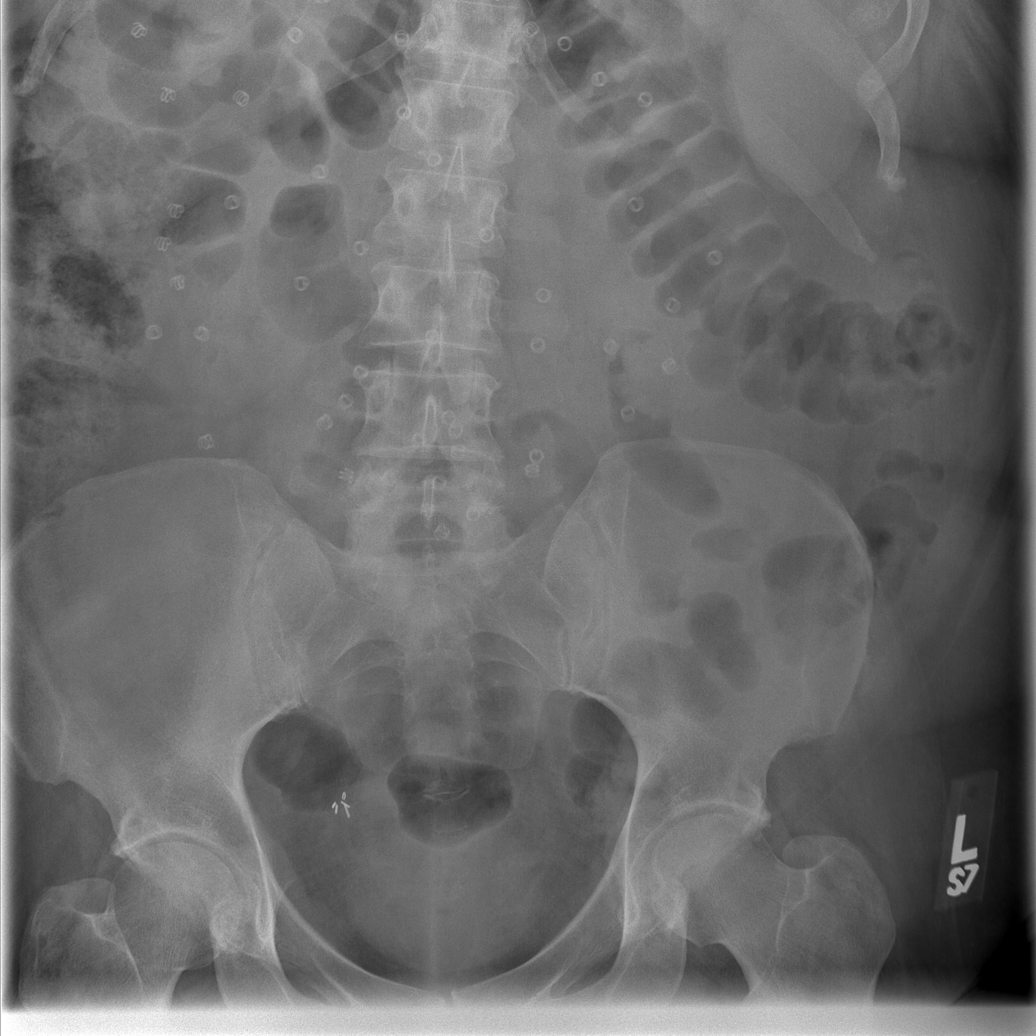

[t abdomen supine (2 of 2)]
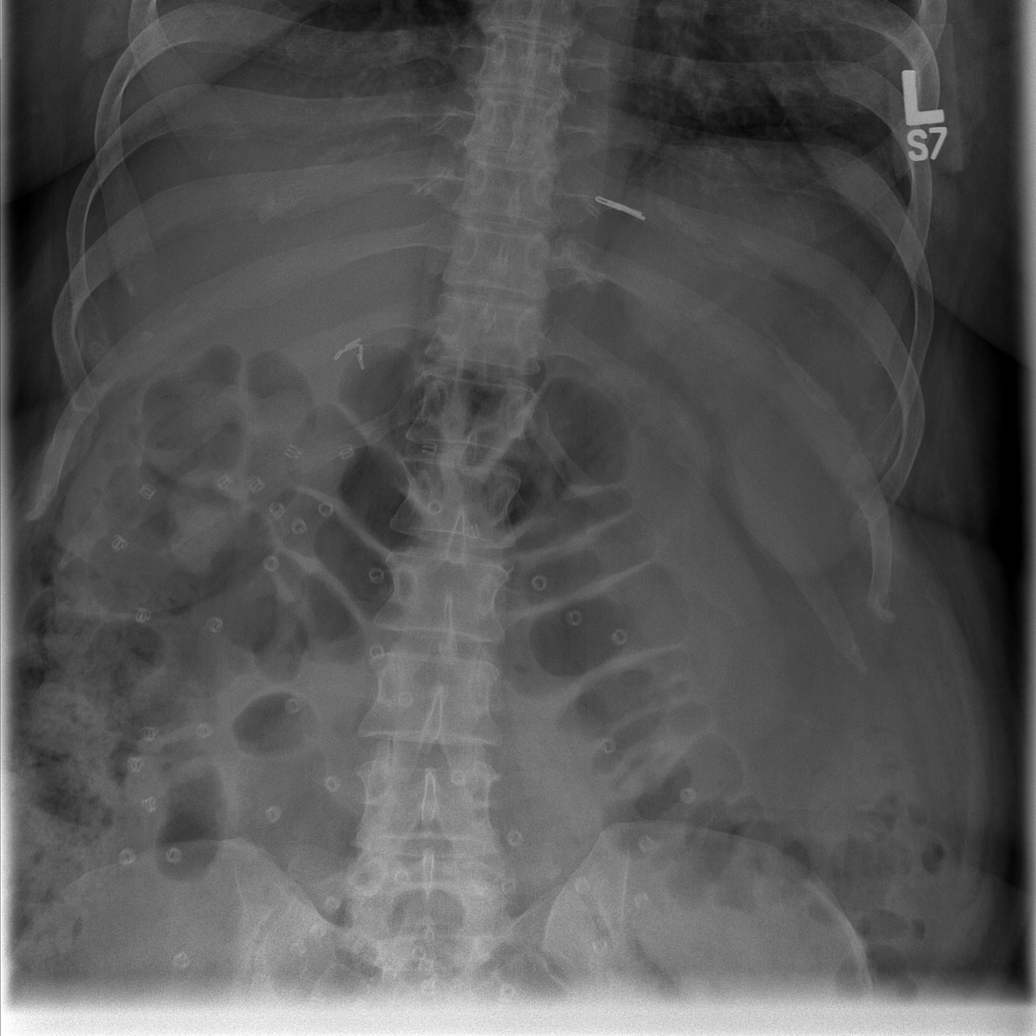

[4 of 4 positions shown; findings below may reference images not displayed]

FINDINGS: Borderline cardiomegaly without congestive heart failure
or pneumonia in one-view.

No acute or specific abnormality of the bowel gas pattern.
Postsurgical changes noted related to hernia repair.  There are
also surgical clips in the region of the GE junction and in the
right pelvis.  No pathological calcifications or bony lesions.
There are degenerative changes of the hips.
IMPRESSION: 1.  Borderline cardiomegaly - no active pulmonary disease in one-
view.
2.  Postsurgical changes but no acute or specific abdominal
findings.

## 2010-09-14 ENCOUNTER — Encounter: Payer: Self-pay | Admitting: Internal Medicine

## 2010-09-22 ENCOUNTER — Encounter: Payer: Self-pay | Admitting: Critical Care Medicine

## 2010-09-23 ENCOUNTER — Ambulatory Visit: Payer: Self-pay | Admitting: Family Medicine

## 2010-09-24 IMAGING — CT CT PARANASAL SINUSES LIMITED
1 series · 16 of 27 positions shown, 20 images · non-contrast
Comparison: None.

CLINICAL DATA: Chronic sinusitis.

CT PARANASAL SINUS LIMITED WITHOUT CONTRAST
TECHNIQUE: Multidetector CT images of the paranasal sinuses were
obtained in a single plane without contrast.

[Series 3: ltd sinus 3.0 h30s · axial · 0.25mm/px · z∈[+1149,+1261]mm · 16 of 27 slices shown, 20 images]
[im 2/27  brain]
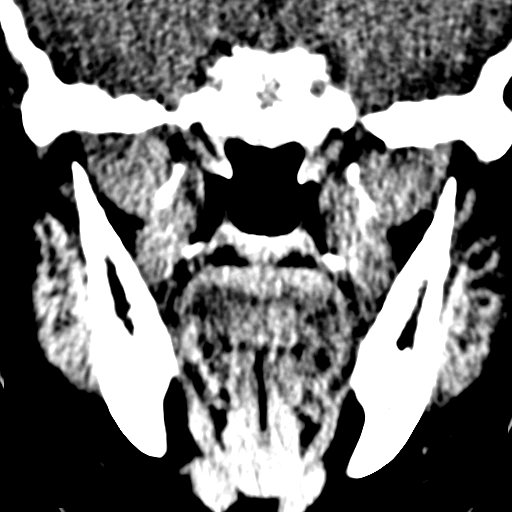
[im 2/27  bone]
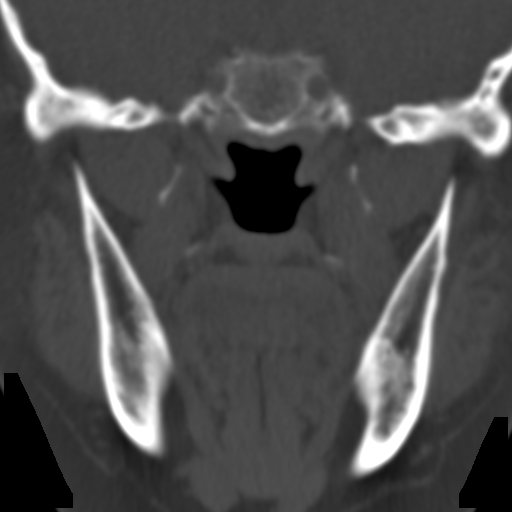
[im 4/27  bone]
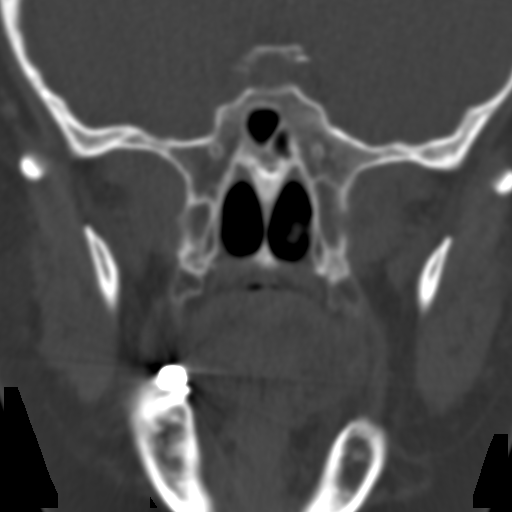
[im 5/27  bone]
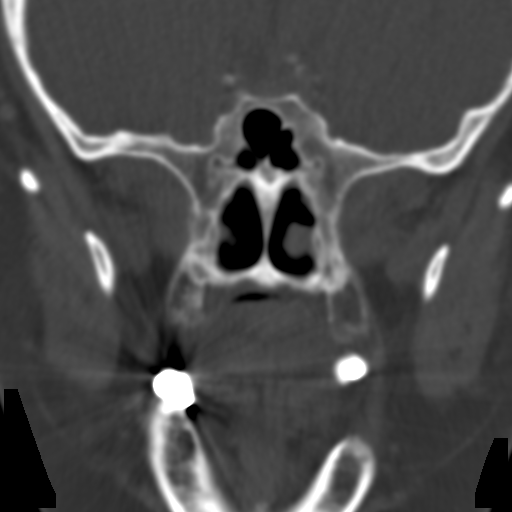
[im 7/27  bone]
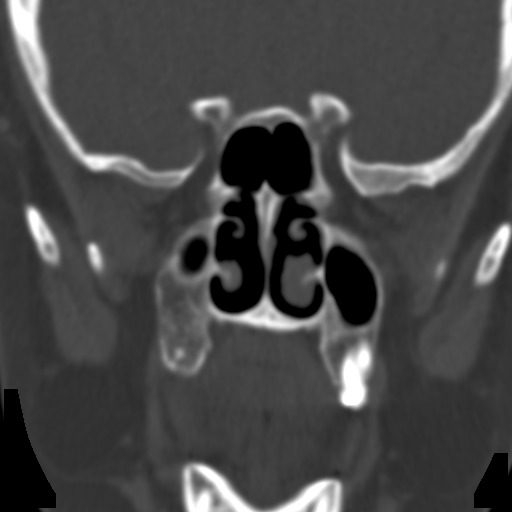
[im 9/27  brain]
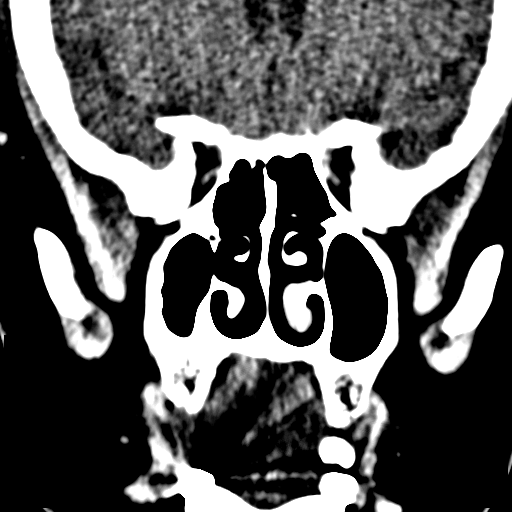
[im 9/27  bone]
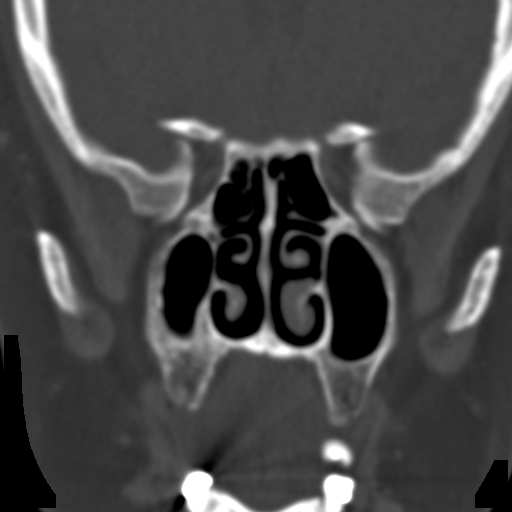
[im 10/27  bone]
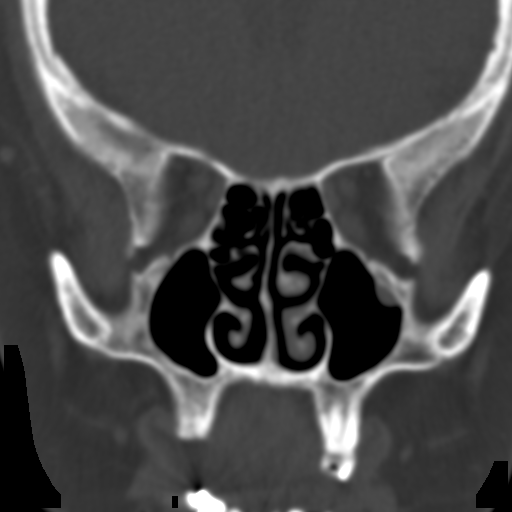
[im 12/27  bone]
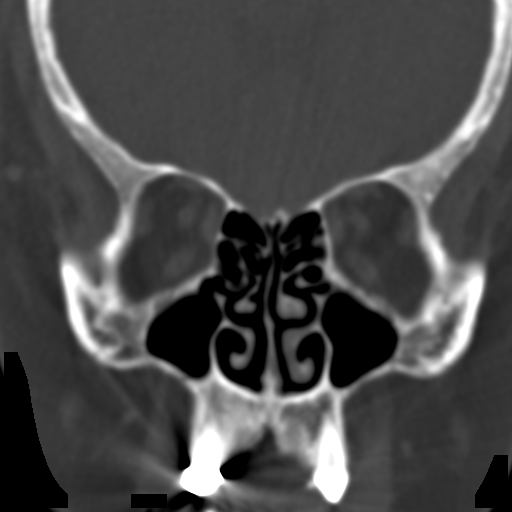
[im 13/27  bone]
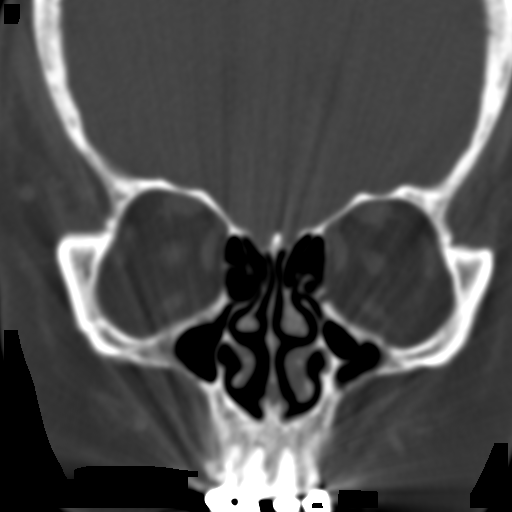
[im 15/27  brain]
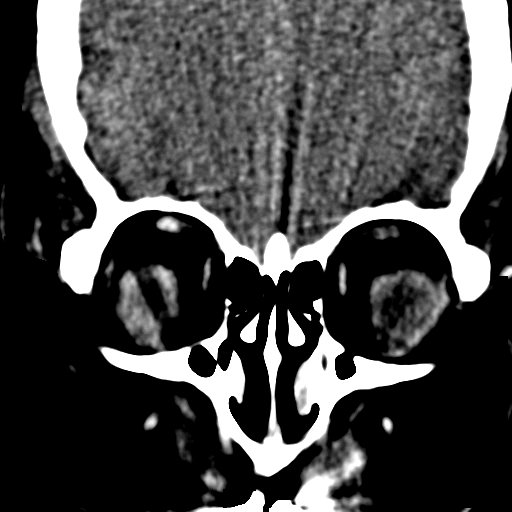
[im 15/27  bone]
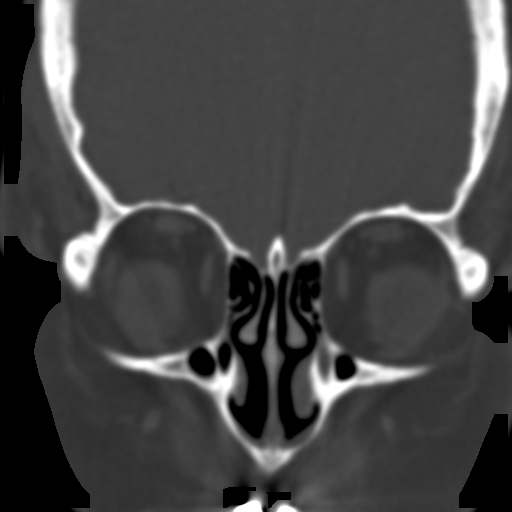
[im 16/27  bone]
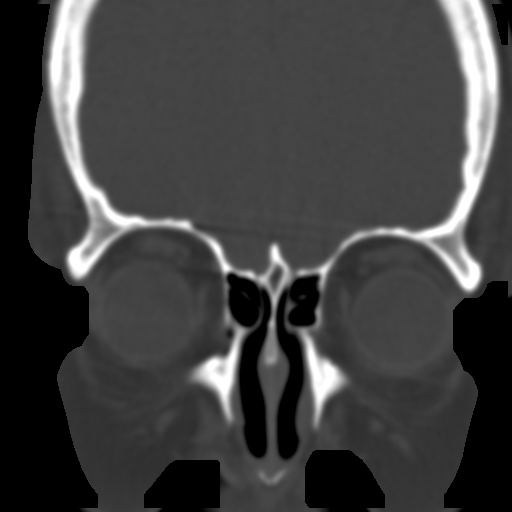
[im 18/27  bone]
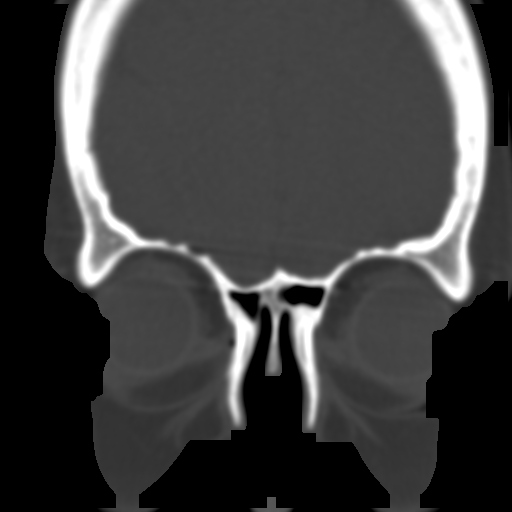
[im 19/27  bone]
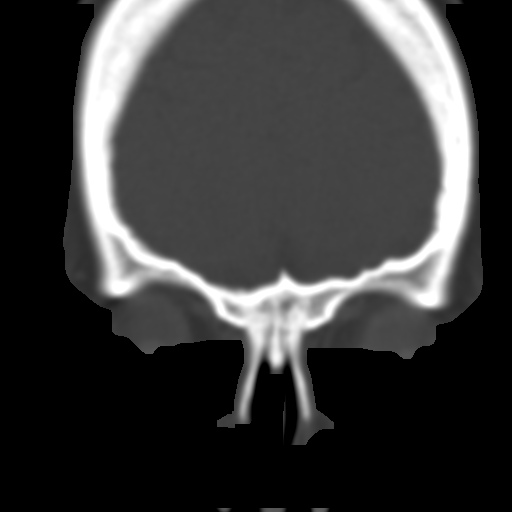
[im 21/27  brain]
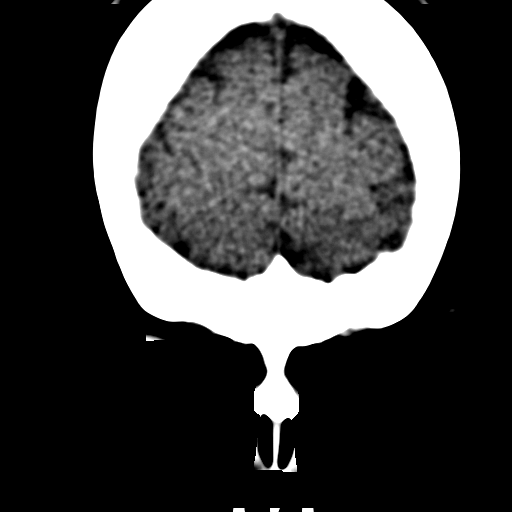
[im 21/27  bone]
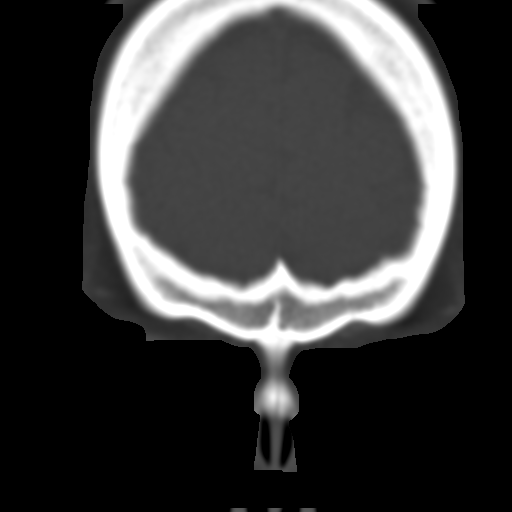
[im 23/27  bone]
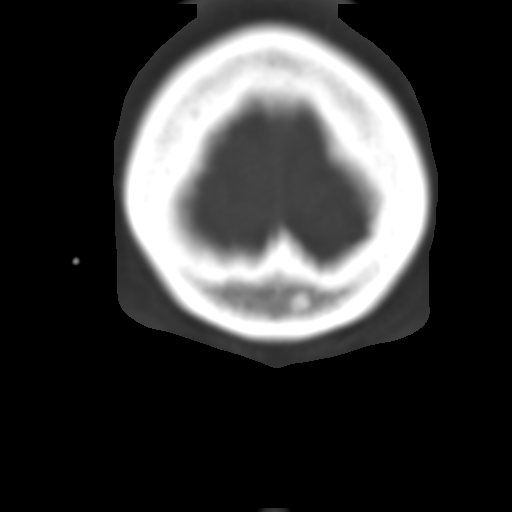
[im 24/27  bone]
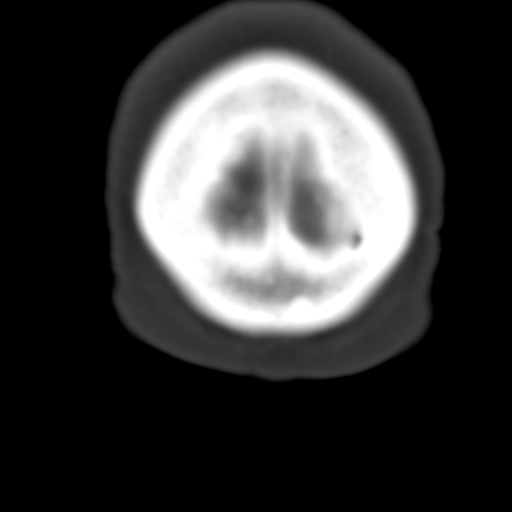
[im 26/27  bone]
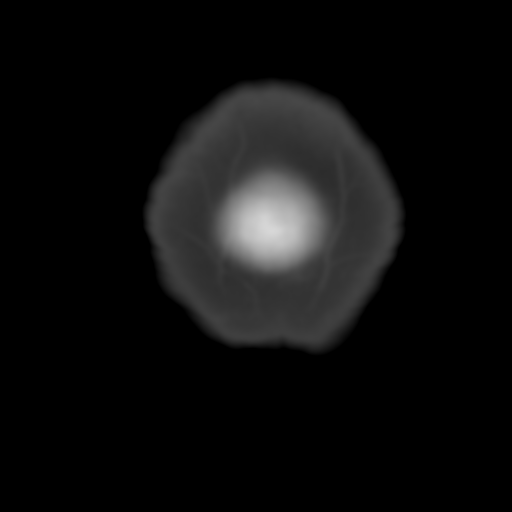

[16 of 27 positions shown; findings below may reference images not displayed]

FINDINGS: The frontal sinuses hypoplastic.  Normal aeration of the
ethmoid, sphenoid, and maxillary sinuses.  Normal nasal cavity.
Bony sinus walls are intact.
IMPRESSION: Negative CT of the sinuses.

## 2010-10-11 LAB — HM PAP SMEAR

## 2010-10-12 ENCOUNTER — Encounter: Payer: Self-pay | Admitting: Internal Medicine

## 2010-10-13 ENCOUNTER — Ambulatory Visit
Admission: RE | Admit: 2010-10-13 | Discharge: 2010-10-13 | Payer: Self-pay | Source: Home / Self Care | Attending: Internal Medicine | Admitting: Internal Medicine

## 2010-10-13 LAB — CONVERTED CEMR LAB
ALT: 41 units/L — ABNORMAL HIGH (ref 0–35)
AST: 31 units/L (ref 0–37)
Albumin: 3.6 g/dL (ref 3.5–5.2)
Alkaline Phosphatase: 86 units/L (ref 39–117)
BUN: 12 mg/dL (ref 6–23)
Bilirubin, Direct: 0.2 mg/dL (ref 0.0–0.3)
CO2: 29 meq/L (ref 19–32)
Calcium: 9.1 mg/dL (ref 8.4–10.5)
Chloride: 100 meq/L (ref 96–112)
Cholesterol: 251 mg/dL — ABNORMAL HIGH (ref 0–200)
Creatinine, Ser: 0.5 mg/dL (ref 0.4–1.2)
Direct LDL: 169 mg/dL
GFR calc non Af Amer: 152.09 mL/min (ref >60.00)
Glucose, Bld: 156 mg/dL — ABNORMAL HIGH (ref 70–99)
HDL: 41.8 mg/dL (ref >39.00)
Hgb A1c MFr Bld: 8.2 % — ABNORMAL HIGH (ref 4.6–6.5)
Potassium: 4.2 meq/L (ref 3.5–5.1)
Sodium: 138 meq/L (ref 135–145)
Total Bilirubin: 0.9 mg/dL (ref 0.3–1.2)
Total CHOL/HDL Ratio: 6
Total Protein: 6.8 g/dL (ref 6.0–8.3)
Triglycerides: 296 mg/dL — ABNORMAL HIGH (ref 0.0–149.0)
VLDL: 59.2 mg/dL — ABNORMAL HIGH (ref 0.0–40.0)

## 2010-10-19 ENCOUNTER — Ambulatory Visit
Admission: RE | Admit: 2010-10-19 | Discharge: 2010-10-19 | Payer: Self-pay | Source: Home / Self Care | Attending: Critical Care Medicine | Admitting: Critical Care Medicine

## 2010-10-20 ENCOUNTER — Encounter: Payer: Self-pay | Admitting: Internal Medicine

## 2010-10-20 ENCOUNTER — Telehealth: Payer: Self-pay | Admitting: Critical Care Medicine

## 2010-10-20 ENCOUNTER — Other Ambulatory Visit: Payer: Self-pay | Admitting: Internal Medicine

## 2010-10-20 ENCOUNTER — Ambulatory Visit: Payer: Self-pay | Admitting: Internal Medicine

## 2010-10-20 ENCOUNTER — Ambulatory Visit
Admission: RE | Admit: 2010-10-20 | Discharge: 2010-10-20 | Payer: Self-pay | Source: Home / Self Care | Attending: Internal Medicine | Admitting: Internal Medicine

## 2010-10-20 LAB — CONVERTED CEMR LAB
Bilirubin Urine: NEGATIVE
Blood in Urine, dipstick: NEGATIVE
Glucose, Urine, Semiquant: NEGATIVE
Ketones, urine, test strip: NEGATIVE
Nitrite: NEGATIVE
Protein, U semiquant: NEGATIVE
Specific Gravity, Urine: 1.01
Urobilinogen, UA: 0.2
pH: 5

## 2010-10-20 LAB — SEDIMENTATION RATE: Sed Rate: 25 mm/hr — ABNORMAL HIGH (ref 0–22)

## 2010-10-20 NOTE — Assessment & Plan Note (Signed)
Recent steroid use may explain increase A1C

## 2010-10-20 NOTE — Assessment & Plan Note (Signed)
Not as well controlled She has continued to concentrate on weight---congratulated

## 2010-10-21 ENCOUNTER — Encounter: Payer: Self-pay | Admitting: Internal Medicine

## 2010-10-29 ENCOUNTER — Encounter: Payer: Self-pay | Admitting: Critical Care Medicine

## 2010-11-08 LAB — CONVERTED CEMR LAB
ALT: 17 units/L (ref 0–35)
AST: 20 units/L (ref 0–37)
BUN: 13 mg/dL (ref 6–23)
CO2: 34 meq/L — ABNORMAL HIGH (ref 19–32)
Calcium: 9.2 mg/dL (ref 8.4–10.5)
Chloride: 105 meq/L (ref 96–112)
Cholesterol: 153 mg/dL (ref 0–200)
Creatinine, Ser: 0.8 mg/dL (ref 0.4–1.2)
Direct LDL: 72.4 mg/dL
GFR calc Af Amer: 98 mL/min
GFR calc non Af Amer: 81 mL/min
Glucose, Bld: 176 mg/dL — ABNORMAL HIGH (ref 70–99)
HDL: 46.6 mg/dL (ref >39.0)
Hgb A1c MFr Bld: 6.3 % — ABNORMAL HIGH (ref 4.6–6.0)
Potassium: 3.8 meq/L (ref 3.5–5.1)
Sodium: 145 meq/L (ref 135–145)
Total CHOL/HDL Ratio: 3.3
Triglycerides: 401 mg/dL (ref 0–149)
VLDL: 80 mg/dL — ABNORMAL HIGH (ref 0–40)

## 2010-11-08 NOTE — Progress Notes (Signed)
  Subjective:    Patient ID: [name], female    DOB: Nov 23, 1960, 50 y.o.   MRN: [id number]  HPI    Review of Systems     Objective:   Physical Exam        Assessment & Plan:

## 2010-11-12 NOTE — Assessment & Plan Note (Signed)
Summary: leg pain/mhf   Vital Signs:  Patient Profile:   50 Years Old Female Height:     66 inches Weight:      296 pounds Temp:     98.2 degrees F oral BP sitting:   130 / 72  (left arm) Cuff size:   regular  Vitals Entered By: Sid Falcon LPN (April 26, 2008 4:07 PM)                 Referred by:  Chase Picket PCP:  Dr. Birdie Sons  Chief Complaint:  Bil leg pain and ankles and feet swelling X 1 week.  History of Present Illness: pt has noted edema of bilateral lower extremities- no recent med changes---long term use amlodipine. no excesive nsaid use. Chronic SOB, no change  Past Medical History: Anemia-NOS Diabetes mellitus, type II GERD Hyperlipidemia Hypertension pulmonary nodule OSA Vocal Cord dysfunction with recurrent cough Asthma vs Vocal cord dysfundtion SARCOID   Past Surgical History: Ventral Hernia Repair Nissen Fundoplication Cholecystectomy Hysterectomy  Social History: Married Never Smoked Regular exercise-no Alcohol use-no  Family History: father-alive MI, DM mother alive and well uncle with SARCOID     Current Allergies (reviewed today): ! METHOTREXATE     Review of Systems       no other complaints in a complete ROS    Physical Exam  General:     alert and well-developed.   Head:     normocephalic and atraumatic.   Eyes:     pupils equal and pupils round.   Ears:     R ear normal and L ear normal.   Neck:     No deformities, masses, or tenderness noted. Chest Wall:     No deformities, masses, or tenderness noted. Lungs:     Normal respiratory effort, chest expands symmetrically. Lungs are clear to auscultation, no crackles or wheezes. Heart:     Normal rate and regular rhythm. S1 and S2 normal without gallop, murmur, click, rub or other extra sounds.    Impression & Recommendations:  Problem # 1:  EDEMA (ICD-782.3) likely medication related d/c amlodipine  Her updated medication list for this problem  includes:    Atenolol-chlorthalidone 50-25 Mg Tabs (Atenolol-chlorthalidone) .[name]... 1/1/2 daily    Furosemide 20 Mg Tabs (Furosemide) .[name]... Take one (1) by mouth daily   Problem # 2:  HYPERTENSION (ICD-401.9)  The following medications were removed from the medication list:    Amlodipine Besylate 10 Mg Tabs (Amlodipine besylate) .[name]... Take 1 tablet by mouth once a day  Her updated medication list for this problem includes:    Atenolol-chlorthalidone 50-25 Mg Tabs (Atenolol-chlorthalidone) .[name]... 1/1/2 daily    Furosemide 20 Mg Tabs (Furosemide) .[name]... Take one (1) by mouth daily    Benicar 40 Mg Tabs (Olmesartan medoxomil) .[name]... 1 by mouth once daily  BP today: 130/72 Prior BP: 146/82 (04/18/2008)  Prior 10 Yr Risk Heart Disease: 8 % (09/21/2006)  Labs Reviewed: Creat: 0.8 (02/28/2008) Chol: 170 (01/25/2008)   HDL: 45.5 (01/25/2008)   LDL: 85 (01/25/2008)   TG: 199 (01/25/2008)   Complete Medication List: 1)  Proair Hfa 108 (90 Base) Mcg/act Aers (Albuterol sulfate) .... 2 puffs three times a day as needed 2)  Atenolol-chlorthalidone 50-25 Mg Tabs (Atenolol-chlorthalidone) .... 1/1/2 daily 3)  Budeprion Xl 300 Mg Tb24 (Bupropion hcl) .... Take 1 tablet by mouth once a day 4)  Effexor Xr 75 Mg Cp24 (Venlafaxine hcl) .... Take 1 capsule by mouth once a day  5)  Lorazepam 1 Mg Tabs (Lorazepam) .... Prn 6)  Novolog Flexpen 100 Unit/ml Soln (Insulin aspart) .... Three times a day as directed 7)  Onetouch Ultra Test Strp (Glucose blood) .... Tid 8)  Potassium Chloride Crys Cr 20 Meq Tbcr (Potassium chloride crys cr) .... Take 1 tablet by mouth two times a day 9)  Prednisone 10 Mg Tabs (Prednisone) .[name].. 1 1/2 once daily 10)  Zegerid 40-1100 Mg Caps (Omeprazole-sodium bicarbonate) .... 2 by mouth daily 11)  Crestor 20 Mg Tabs (Rosuvastatin calcium) .[name].. 1 by mouth once daily 12)  Qvar 80 Mcg/act Aers (Beclomethasone dipropionate) .... Two puffs twice daily 13)  Pen Needles 31g X 8 Mm Misc  (Insulin pen needle) .... Use as directed 14)  Premarin 0.3 Mg Tabs (Estrogens conjugated) .... Take two daily 15)  Remicade 100 Mg Solr (Infliximab) .... Injection every 6 weeks 16)  Fluticasone Propionate 50 Mcg/act Susp (Fluticasone propionate) .... 2 sprays each nostril once daily 17)  Furosemide 20 Mg Tabs (Furosemide) .... Take one (1) by mouth daily 18)  Vicodin 5-500 Mg Tabs (Hydrocodone-acetaminophen) .... One tab every 6 hours as needed 19)  Vitamin D 1000 Unit Caps (Cholecalciferol) .... Two times a day 20)  Benicar 40 Mg Tabs (Olmesartan medoxomil) .[name].. 1 by mouth once daily   Patient Instructions: 1)  4-6 weeks   Prescriptions: BENICAR 40 MG  TABS (OLMESARTAN MEDOXOMIL) 1 by mouth once daily  #90 x 3   Entered and Authorized by:   Birdie Sons MD   Signed by:   Birdie Sons MD on 04/30/2008   Method used:   Samples Given   RxID:   [id number]  ]

## 2010-11-12 NOTE — Assessment & Plan Note (Signed)
Summary: 3 MO ROV/MM   Vital Signs:  Patient profile:   50 year old female Weight:      264 pounds Temp:     97.9 degrees F oral Pulse rate:   72 / minute Pulse rhythm:   regular Resp:     20 per minute BP sitting:   118 / 72  (left arm) Cuff size:   large  Vitals Entered By: Gladis Riffle, RN (December 09, 2009 9:47 AM) CC: 3 month rov--c/o frequency and right flank pain x 1 week Is Patient Diabetic? Yes Did you bring your meter with you today? No Comments CBGs around 157 at home   Primary Care Provider:  Birdie Sons, MD   CC:  3 month rov--c/o frequency and right flank pain x 1 week.  History of Present Illness:  Follow-Up Visit      [name] who presents for Follow-up visit.  The patient denies chest pain and palpitations.  Since the last visit the patient notes no new problems or concerns.  The patient reports taking meds as prescribed.  When questioned about possible medication side effects, the patient notes none.    All other systems reviewed and were negative   Preventive Screening-Counseling & Management  Alcohol-Tobacco     Smoking Status: never  Current Problems (verified): 1)  Dysphagia Unspecified  (ICD-787.20) 2)  Anemia-unspecified  (ICD-285.9) 3)  Back Pain  (ICD-724.5) 4)  Obesity  (ICD-278.00) 5)  Sleep Apnea  (ICD-780.57) 6)  Menopausal Syndrome  (ICD-627.2) 7)  Sarcoidosis  (ICD-135) 8)  Hypertension  (ICD-401.9) 9)  Hyperlipidemia  (ICD-272.4) 10)  Gerd  (ICD-530.81) 11)  Diabetes Mellitus, Type II  (ICD-250.00)  Current Medications (verified): 1)  Atenolol-Chlorthalidone 50-25 Mg Tabs (Atenolol-Chlorthalidone) .... Take 1 Tablet By Mouth Once A Day 2)  Effexor Xr 75 Mg Cp24 (Venlafaxine Hcl) .... Take 1 Tablet By Mouth Once A Day 3)  Lorazepam 1 Mg Tabs (Lorazepam) .... One Every 6 Hrs As Needed 4)  Novolog Flexpen 100 Unit/ml Soln (Insulin Aspart) .... Three Times A Day As Directed 5)  Onetouch Ultra Test  Strp (Glucose  Blood) .... Tid 6)  Potassium Chloride Crys Cr 20 Meq Tbcr (Potassium Chloride Crys Cr) .[name].. 1 Three Times A Day 7)  Prednisone 1 Mg Tabs (Prednisone) .... Take 9 Mg By Mouth For 2 Weeks 8)  Zegerid 40-1100 Mg Caps (Omeprazole-Sodium Bicarbonate) .... Take 1 Tablet By Mouth Once A Day 9)  Pen Needles 31g X 8 Mm  Misc (Insulin Pen Needle) .... Use As Directed 10)  Remicade 100 Mg  Solr (Infliximab) .... Injection Every 6 Weeks 11)  Fluticasone Propionate 50 Mcg/act  Susp (Fluticasone Propionate) .... 2 Sprays Each Nostril Once Daily 12)  Furosemide 20 Mg Tabs (Furosemide) .... Take One (1) By Mouth Daily 13)  Vicodin 5-500 Mg  Tabs (Hydrocodone-Acetaminophen) .... One Tab Every 6 Hours As Needed 14)  Estrace 1 Mg Tabs (Estradiol) .... Take 1 Tablet By Mouth Once A Day 15)  Restasis 0.05 % Emul (Cyclosporine) .[name].. 1 Drop Each Eye Daily 16)  Lotemax 0.5 % Susp (Loteprednol Etabonate) .[name].. 1 Drop Each Eye Every 6 Hours As Needed 17)  Tussionex Pennkinetic Er 8-10 Mg/34ml Lqcr (Chlorpheniramine-Hydrocodone) .[name].. 1 Tsp Every 12 Hr As Needed For Cough, May Make You Sleepy 18)  Simvastatin 40 Mg Tabs (Simvastatin) .... Take One Tablet At Bedtime Start One Week After Completing Diflucan 19)  Magnesium Oxide 250 Mg Tabs (Magnesium  Oxide) .... One Tablet By Mouth Once Daily 20)  Levbid 0.375 Mg Xr12h-Tab (Hyoscyamine Sulfate) .[name].. 1 By Mouth Two Times A Day As Needed 21)  Prednisone 5 Mg Tabs (Prednisone) .... Take 1 Tablet By Mouth Once A Day  Allergies (verified): 1)  ! Methotrexate  Past History:  Past Medical History: Last updated: 10/13/2009 EDEMA (ICD-782.3) HEADACHE (ICD-784.0) MENOPAUSAL SYNDROME (ICD-627.2) UTI (ICD-599.0) SARCOIDOSIS (ICD-135)   -CT Chest 6/08 improvement in Mediastinal LAN   -11/08 start of remecade Rx per [facility] PULMONARY NODULE (ICD-518.89)    -CT Chest 6/08 reduction in size of nodules due to Arava and Pred RX     -probable granulomas due to  Sarcoidosis HYPERTENSION (ICD-401.9) HYPERLIPIDEMIA (ICD-272.4) GERD (ICD-530.81) Sleep APnea DIABETES MELLITUS, TYPE II (ICD-250.00) ANEMIA-NOS (ICD-285.9)  Fundic Gland Polyp  Past Surgical History: Last updated: 08/21/2006 Ventral Hernia Repair Nissen Fundoplication Cholecystectomy Hysterectomy  Family History: Last updated: 10/20/2009 father-alive MI, DM mother alive and well uncle with SARCOID No FH of Colon Cancer: Family History Emphysema-Mother, Father  Family History Asthma-Asthma Family History COPD -mother  Social History: Last updated: 10/20/2009 Married, live with husband Never Smoked Regular exercise-no Alcohol use-no Illicit Drug Use - no  Risk Factors: Exercise: no (05/31/2007)  Risk Factors: Smoking Status: never (12/09/2009)  Review of Systems       All other systems reviewed and were negative   Physical Exam  General:  Well-developed,well-nourished,in no acute distress; alert,appropriate and cooperative throughout examination Head:  normocephalic and atraumatic.   Eyes:  pupils equal and pupils round.   Ears:  R ear normal and L ear normal.   Neck:  no masses, thyromegaly, or abnormal cervical nodes Chest Wall:  No deformities, masses, or tenderness noted. Lungs:  Normal respiratory effort, chest expands symmetrically. Lungs are clear to auscultation, no crackles or wheezes. Heart:  normal rate and regular rhythm.   Abdomen:  the abdomen is markedly bloated.  There is high-pitched tinkling bowel sounds.  Diffuse abdominal pain.  No rebound Msk:  No deformity or scoliosis noted of thoracic or lumbar spine.   Neurologic:  cranial nerves II-XII intact and gait normal.   Skin:  turgor normal and color normal.   Psych:  memory intact for recent and remote and normally interactive.     Impression & Recommendations:  Problem # 1:  OBESITY (ICD-278.00) this is her main problem advised aggressive diet/exercise and weight loss she has started  weight watchers (20 pound loss at weight watchers)  Problem # 2:  ANEMIA-UNSPECIFIED (ICD-285.9) chronic sxs no further evaluation  Problem # 3:  SLEEP APNEA (ICD-780.57) followed by pulmonary and tolerating CPAP---still has some fatigue  Problem # 4:  DIABETES MELLITUS, TYPE II (ICD-250.00) improved I suspect she could get off meds with aggressive weight loss Her updated medication list for this problem includes:    Novolog Flexpen 100 Unit/ml Soln (Insulin aspart) .[name]... Three times a day as directed  Labs Reviewed: Creat: 0.7 (12/02/2009)     Last Eye Exam: normal-pt's report (07/11/2008) Reviewed HgBA1c results: 6.6 (12/02/2009)  7.1 (09/02/2009)  Problem # 5:  GERD (ICD-530.81) tolerating meds and not having any sxs Her updated medication list for this problem includes:    Zegerid 40-1100 Mg Caps (Omeprazole-sodium bicarbonate) .[name]... Take 1 tablet by mouth once a day    Magnesium Oxide 250 Mg Tabs (Magnesium oxide) ..... One tablet by mouth once daily    Levbid 0.375 Mg Xr12h-tab (Hyoscyamine sulfate) .[name]... 1 by mouth two times  a day as needed  EGD: DONE (11/25/2009)  Labs Reviewed: Hgb: 12.8 (09/18/2009)   Hct: 37.4 (09/18/2009)  Problem # 6:  SARCOIDOSIS (ICD-135) remicade q 6 weeks with rheumatology  Problem # 7:  HYPERTENSION (ICD-401.9) wellcontrolled continue current medications  (note dual diuretic---may simplify in the future, note potassium) Her updated medication list for this problem includes:    Atenolol-chlorthalidone 50-25 Mg Tabs (Atenolol-chlorthalidone) .[name]... Take 1 tablet by mouth once a day    Furosemide 20 Mg Tabs (Furosemide) .[name]... Take one (1) by mouth daily  BP today: 118/72 Prior BP: 138/80 (11/11/2009)  Prior 10 Yr Risk Heart Disease: 8 % (09/21/2006)  Labs Reviewed: K+: 3.4 (12/02/2009) Creat: : 0.7 (12/02/2009)   Chol: 171 (12/02/2009)   HDL: 44.10 (12/02/2009)   LDL: DEL (09/30/2008)   TG: 314.0 (12/02/2009)  Complete Medication  List: 1)  Atenolol-chlorthalidone 50-25 Mg Tabs (Atenolol-chlorthalidone) .... Take 1 tablet by mouth once a day 2)  Effexor Xr 75 Mg Cp24 (Venlafaxine hcl) .... Take 1 tablet by mouth once a day 3)  Lorazepam 1 Mg Tabs (Lorazepam) .... One every 6 hrs as needed 4)  Novolog Flexpen 100 Unit/ml Soln (Insulin aspart) .... Three times a day as directed 5)  Onetouch Ultra Test Strp (Glucose blood) .... Tid 6)  Potassium Chloride Crys Cr 20 Meq Tbcr (Potassium chloride crys cr) .[name].. 1 three times a day 7)  Prednisone 1 Mg Tabs (Prednisone) .... Take 9 mg by mouth for 2 weeks 8)  Zegerid 40-1100 Mg Caps (Omeprazole-sodium bicarbonate) .... Take 1 tablet by mouth once a day 9)  Pen Needles 31g X 8 Mm Misc (Insulin pen needle) .... Use as directed 10)  Remicade 100 Mg Solr (Infliximab) .... Injection every 6 weeks 11)  Fluticasone Propionate 50 Mcg/act Susp (Fluticasone propionate) .... 2 sprays each nostril once daily 12)  Furosemide 20 Mg Tabs (Furosemide) .... Take one (1) by mouth daily 13)  Vicodin 5-500 Mg Tabs (Hydrocodone-acetaminophen) .... One tab every 6 hours as needed 14)  Estrace 1 Mg Tabs (Estradiol) .... Take 1 tablet by mouth once a day 15)  Restasis 0.05 % Emul (Cyclosporine) .[name].. 1 drop each eye daily 16)  Lotemax 0.5 % Susp (Loteprednol etabonate) .[name].. 1 drop each eye every 6 hours as needed 17)  Tussionex Pennkinetic Er 8-10 Mg/28ml Lqcr (Chlorpheniramine-hydrocodone) .[name].. 1 tsp every 12 hr as needed for cough, may make you sleepy 18)  Simvastatin 40 Mg Tabs (Simvastatin) .... Take one tablet at bedtime start one week after completing diflucan 19)  Magnesium Oxide 250 Mg Tabs (Magnesium oxide) .... One tablet by mouth once daily 20)  Levbid 0.375 Mg Xr12h-tab (Hyoscyamine sulfate) .[name].. 1 by mouth two times a day as needed 21)  Prednisone 5 Mg Tabs (Prednisone) .... Take 1 tablet by mouth once a day

## 2010-11-12 NOTE — Progress Notes (Signed)
Summary: Abd Pain   Phone Note Call from Patient Call back at Work Phone [phone]   Call For: Dr Marina Goodell Summary of Call: Really bad abd pains-would like to be seen prior to next available on 10-02-09 Initial call taken by: Leanor Kail [facility]  September 15, 2009 4:23 PM  Follow-up for Phone Call        Pt. requestibg to be seen.Says epigastric pain started DEC.1 after eating evening mea. Pain isIs intermittent but occurs after eating. Describes pain level up to an 8 and it lasts about 10 minutes.CT. neg. except for.anterolateral abd. wall hernia overlying liver with associated herniated omental fat. Follow-up by: Teryl Lucy RN,  September 15, 2009 4:52 PM  Additional Follow-up for Phone Call Additional follow up Details #1::        you can work her in with me if I have a cancellation or have her see one of the extenders Additional Follow-up by: Hilarie Fredrickson MD,  September 15, 2009 5:06 PM    Additional Follow-up for Phone Call Additional follow up Details #2::    pt. scheduled for appt.with NP on 09/18/09. Follow-up by: Teryl Lucy RN,  September 16, 2009 8:36 AM

## 2010-11-12 NOTE — Letter (Signed)
Summary: [facility]   Imported By: Maryln Gottron 10/27/2010 15:33:34  _____________________________________________________________________  External Attachment:    Type:   Image     Comment:   External Document

## 2010-11-12 NOTE — Letter (Signed)
Summary: Letter of Medical Necessity for Diabetic Testing Supplies  Letter of Medical Necessity for Diabetic Testing Supplies   Imported By: Maryln Gottron 04/28/2009 11:23:44  _____________________________________________________________________  External Attachment:    Type:   Image     Comment:   External Document

## 2010-11-12 NOTE — Progress Notes (Signed)
Summary: no rx e scribed yesterday?  Phone Note Call from Patient Call back at [phone] ext [phone]   Caller: pt live Call For: Swords Summary of Call: Dr Cato Mulligan said he would e scribe an rx to [facility] yesterday for this pt's UTI.  Do not see in the chart where the e scribe was done and the drugstore said they never recieved anything.   Initial call taken by: Roselle Locus,  Feb 29, 2008 11:09 AM  Follow-up for Phone Call        Rx on note from ov, telephoned to pharmacy.Patient notified.  Follow-up by: Gladis Riffle, RN,  Mar 01, 2008 10:24 AM

## 2010-11-12 NOTE — Miscellaneous (Signed)
Summary: diabetic eye exam   Clinical Lists Changes       Diabetes Management History:      She is (or has been) enrolled in the "Diabetic Education Program".  She says that she is not exercising regularly.    Diabetes Management Exam:    Eye Exam:       Eye Exam done elsewhere          Date: 09/01/2009          Results: normal          Done by: angela martinek OD

## 2010-11-12 NOTE — Assessment & Plan Note (Signed)
Summary: Pulmonary OV   Copy to:  [name] Primary [name]/Referring [name]:  [name] Sons, MD   CC:  Acute Visit.  increased SOB when laying down, nonprod cough, and chest tightness -- started yesterday.  Denies f/c/[name].  [name]  History of Present Illness: 50 year old, white female with chronic pulmonary sarcoidosis.  This is been manifested by cyclic cough and mediastinal adenopathy with associated chest pain.  There are also stable pulmonary nodules.  She has been intolerant of systemic corticosteroids &  had adverse side effects to Arava(leflunamide). She was referred the patient in November 2008  to [name],  for Remicade injections.  There has been a positive response to Rx. She had severe GERD [name]/p fundoplication in 2001.  June 17, 2010 3:50 PM The pt started back coughing and  having difficulty breathing.  The pt notes some heartburn. The pt has not been compliant with reflux diet.  Pt notes upper airway wheeze. There is some chest pain ,  the pt is dyspneic.      Preventive Screening-Counseling & Management  Alcohol-Tobacco     Smoking Status: never  Current Medications (verified): 1)  Atenolol-Chlorthalidone 50-25 Mg Tabs (Atenolol-Chlorthalidone) .... Take 1 Tablet By Mouth Once A Day 2)  Effexor Xr 75 Mg Cp24 (Venlafaxine Hcl) .... Take 1 Tablet By Mouth Once A Day 3)  Lorazepam 1 Mg Tabs (Lorazepam) .... One Every 6 Hrs As Needed 4)  Remicade 100 Mg  Solr (Infliximab) .... Injection Every 6 Weeks 5)  Omnaris 50 Mcg/act Susp (Ciclesonide) .... One Spray Each Nostril Once Daily 6)  Estrace 1 Mg Tabs (Estradiol) .... Take 1 Tablet By Mouth Once A Day 7)  Restasis 0.05 % Emul (Cyclosporine) .[name].. 1 Drop Each Eye Daily 8)  Lotemax 0.5 % Susp (Loteprednol Etabonate) .[name].. 1 Drop Each Eye Every 6 Hours As Needed 9)  Simvastatin 40 Mg Tabs (Simvastatin) .... Take One Tablet At Bedtime Start One Week After Completing Diflucan 10)  Levbid 0.375 Mg Xr12h-Tab  (Hyoscyamine Sulfate) .[name].. 1 By Mouth Two Times A Day As Needed 11)  Prednisone 5 Mg Tabs (Prednisone) .... Take 1 Tablet By Mouth Every Other Day 12)  Cpap 7 Cm 13)  Nystatin-Triamcinolone 100000-0.1 Unit/gm-% Crea (Nystatin-Triamcinolone) .... Apply Two Times A Day To Affected Area 14)  Hydromet 5-1.5 Mg/32ml Syrp (Hydrocodone-Homatropine) .[name].. 1 Tsp Three Times A Day As Needed Cough 15)  Furosemide 40 Mg Tabs (Furosemide) .... Qd 16)  Klor-Con M20 20 Meq Cr-Tabs (Potassium Chloride Crys Cr) .... Take 3 Tablet By Mouth Once A Day 17)  Budeprion Xl 300 Mg Xr24h-Tab (Bupropion Hcl) .... Take 1 Tablet By Mouth Once A Day  Allergies (verified): 1)  ! Methotrexate  Past History:  Past medical, surgical, family and social histories (including risk factors) reviewed, and no changes noted (except as noted below).  Past Medical History: Reviewed history from 05/21/2010 and no changes required.  SARCOIDOSIS (ICD-135)   -CT Chest 6/08 improvement in Mediastinal LAN   -11/08 start of remecade Rx per [name]/[name] Rheum PULMONARY NODULE (ICD-518.89)    -CT Chest 6/08 reduction in size of nodules due to Arava and Pred RX     -probable granulomas due to Sarcoidosis HYPERTENSION (ICD-401.9) HYPERLIPIDEMIA (ICD-272.4) GERD (ICD-530.81) Sleep APnea DIABETES MELLITUS, TYPE II (ICD-250.00) ANEMIA-NOS (ICD-285.9)  Fundic Gland Polyp  Past Surgical History: Reviewed history from 05/21/2010 and no changes required. Ventral Hernia Repair Nissen Fundoplication Cholecystectomy Hysterectomy right knee arthroscopy May 11, 2010  Family History:  Reviewed history from 10/20/2009 and no changes required. father-alive MI, DM mother alive and well uncle with SARCOID No FH of Colon Cancer: Family History Emphysema-Mother, Father  Family History Asthma-Asthma Family History COPD -mother  Social History: Reviewed history from 10/20/2009 and no changes required. Married, live with husband Never  Smoked Regular exercise-no Alcohol use-no Illicit Drug Use - no  Review of Systems       The patient complains of shortness of breath with activity, shortness of breath at rest, non-productive cough, chest pain, acid heartburn, indigestion, difficulty swallowing, and sore throat.  The patient denies productive cough, coughing up blood, irregular heartbeats, loss of appetite, weight change, abdominal pain, tooth/dental problems, headaches, nasal congestion/difficulty breathing through nose, sneezing, itching, ear ache, anxiety, depression, hand/feet swelling, joint stiffness or pain, rash, change in color of mucus, and fever.    Vital Signs:  Patient profile:   50 year old female Menstrual status:  hysterectomy Height:      66 inches Weight:      239.25 pounds BMI:     38.76 O2 Sat:      98 % on Room air Temp:     98.0 degrees F oral Pulse rate:   80 / minute BP sitting:   120 / 74  (left arm) Cuff size:   large  Vitals Entered By: [name] Dimitri RN (June 17, 2010 3:43 PM)  O2 Flow:  Room air CC: Acute Visit.  increased SOB when laying down, nonprod cough, chest tightness -- started yesterday.  Denies f/c/[name].   Comments Medications reviewed with patient Daytime contact number verified with patient. [name] Dimitri RN  June 17, 2010 3:43 PM    Physical Exam  Additional Exam:   GEN: A/Ox3; pleasant , NAD, obese female.  HEENT:  [place]/AT, , EACs-clear, TMs-wnl, NOSE-mild erythema, purulence seen R>L THROAT-clear NECK:  Supple w/ fair ROM; no JVD; normal carotid impulses w/o bruits; no thyromegaly or nodules palpated; no lymphadenopathy. RESP  Coarse BS w/ no wheeizng, has some mild upper airway psuedowheezing on forced exp.  CARD:  RRR, no m/r/g   Musco: Warm bil,  no calf tenderness edema, clubbing, pulses intact    Impression & Recommendations:  Problem # 1:  GERD (ICD-530.81) Assessment Deteriorated severe GERD symptoms with overt upper airway instability.  I am  concerned her esophageal wrap performed in 2001 may have become loose plan  resume dexilant may need GI reeval follow reflux diet in a strict way Her updated medication list for this problem includes:    Levbid 0.375 Mg Xr12h-tab (Hyoscyamine sulfate) .[name]... 1 by mouth two times a day as needed    Dexilant 60 Mg Cpdr (Dexlansoprazole) ..... One by mouth daily failed omeprazole severe reflux disease  Orders: Est. Patient Level IV (98119)  Problem # 2:  COUGH (ICD-786.2) Assessment: Deteriorated Cough due to upper airway instability syndrome and vocal cord dysfunction I doubt true asthma plan cough protocol wiht tussicaps/tessalon perles Orders: Est. Patient Level IV (14782)  Medications Added to Medication List This Visit: 1)  Klor-con M20 20 Meq Cr-tabs (Potassium chloride crys cr) .... Take 3 tablet by mouth once a day 2)  Tussicaps 10-8 Mg Xr12h-cap (Hydrocod polst-chlorphen polst) .... One by mouth two times a day as needed cough 3)  Benzonatate 100 Mg Caps (Benzonatate) .... Take one every 4hours as needed for cough 4)  Dexilant 60 Mg Cpdr (Dexlansoprazole) .... One by mouth daily failed omeprazole severe reflux disease  Complete Medication List: 1)  Atenolol-chlorthalidone 50-25 Mg Tabs (Atenolol-chlorthalidone) .... Take 1 tablet by mouth once a day 2)  Effexor Xr 75 Mg Cp24 (Venlafaxine hcl) .... Take 1 tablet by mouth once a day 3)  Lorazepam 1 Mg Tabs (Lorazepam) .... One every 6 hrs as needed 4)  Remicade 100 Mg Solr (Infliximab) .... Injection every 6 weeks 5)  Omnaris 50 Mcg/act Susp (Ciclesonide) .... One spray each nostril once daily 6)  Estrace 1 Mg Tabs (Estradiol) .... Take 1 tablet by mouth once a day 7)  Restasis 0.05 % Emul (Cyclosporine) .[name].. 1 drop each eye daily 8)  Lotemax 0.5 % Susp (Loteprednol etabonate) .[name].. 1 drop each eye every 6 hours as needed 9)  Simvastatin 40 Mg Tabs (Simvastatin) .... Take one tablet at bedtime start one week after completing  diflucan 10)  Levbid 0.375 Mg Xr12h-tab (Hyoscyamine sulfate) .[name].. 1 by mouth two times a day as needed 11)  Prednisone 5 Mg Tabs (Prednisone) .... Take 1 tablet by mouth every other day 12)  Cpap 7 Cm  13)  Nystatin-triamcinolone 100000-0.1 Unit/gm-% Crea (Nystatin-triamcinolone) .... Apply two times a day to affected area 14)  Furosemide 40 Mg Tabs (Furosemide) .... Qd 15)  Klor-con M20 20 Meq Cr-tabs (Potassium chloride crys cr) .... Take 3 tablet by mouth once a day 16)  Budeprion Xl 300 Mg Xr24h-tab (Bupropion hcl) .... Take 1 tablet by mouth once a day 17)  Tussicaps 10-8 Mg Xr12h-cap (Hydrocod polst-chlorphen polst) .... One by mouth two times a day as needed cough 18)  Benzonatate 100 Mg Caps (Benzonatate) .... Take one every 4hours as needed for cough 19)  Dexilant 60 Mg Cpdr (Dexlansoprazole) .... One by mouth daily failed omeprazole severe reflux disease  Patient Instructions: 1)  Start Dexilant one daily 2)  Follow reflux diet 3)  Use cyclic cough protocol with tussicaps and benzonatate 4)  No other medication changes 5)  Return 1 month Prescriptions: DEXILANT 60 MG CPDR (DEXLANSOPRAZOLE) One by mouth daily failed omeprazole Severe reflux disease  #30 x 6   Entered and Authorized by:   Storm Frisk MD   Signed by:   Storm Frisk MD on 06/17/2010   Method used:   Electronically to        [facility] (retail)       [address]       [place], [place]  [address]       Ph: [phone]       Fax: [phone]   RxID:   [id number] BENZONATATE 100 MG CAPS (BENZONATATE) take one every 4hours as needed for cough  #90 x 6   Entered and Authorized by:   Storm Frisk MD   Signed by:   Storm Frisk MD on 06/17/2010   Method used:   Electronically to        [facility] (retail)       [address]       [place], [place]  [address]       Ph: [phone]       Fax: [phone]   RxID:    [id number] TUSSICAPS 10-8 MG XR12H-CAP (HYDROCOD POLST-CHLORPHEN POLST) One by mouth two times a day as needed cough  #20 x 0   Entered and Authorized by:   Storm Frisk MD   Signed by:   Storm Frisk MD on 06/17/2010  Method used:   Print then Give to Patient   RxID:   [id number]

## 2010-11-12 NOTE — Medication Information (Signed)
Summary: Order for Glucose Testing Supplies  Order for Glucose Testing Supplies   Imported By: Maryln Gottron 03/30/2010 09:59:19  _____________________________________________________________________  External Attachment:    Type:   Image     Comment:   External Document

## 2010-11-12 NOTE — Assessment & Plan Note (Signed)
Summary: surgical clearance (shoulder)/et   Vital Signs:  Patient Profile:   50 Years Old Female Height:     66 inches Weight:      283 pounds Temp:     98.3 degrees F Pulse rate:   80 / minute BP sitting:   106 / 66  (left arm) Cuff size:   large  Vitals Entered By: Gladis Riffle, RN (October 30, 2008 11:11 AM)                 Referred by:  Chase Picket PCP:  Dr. Birdie Sons  Chief Complaint:  surgical clearance for right shoulder surgery.  History of Present Illness: Asked to see by dr. Thurston Hole for medical clearance prior to right shoulder surgery pt is very complicated with multiple medical problems she denies any previous trouble with general anesthesia she denies any current cardiopulmonary complaints Current Problems:  SARCOIDOSIS (ICD-135)---followed by dr. Delford Field and receiving remicade from dr. Dareen Piano (rheumatology) HYPERTENSION (ICD-401.9)---tolerating meds without difficulty HYPERLIPIDEMIA (ICD-272.4)---tolerating meds DIABETES MELLITUS, TYPE II (ICD-250.00)---no sxs on meds...cbgs 100-140  Past Medical History: EDEMA (ICD-782.3) HEADACHE (ICD-784.0) MENOPAUSAL SYNDROME (ICD-627.2) UTI (ICD-599.0) SARCOIDOSIS (ICD-135)   -CT Chest 6/08 improvement in Mediastinal LAN   -11/08 start of remecade Rx per [facility] PULMONARY NODULE (ICD-518.89)    -CT Chest 6/08 reduction in size of nodules due to Arava and Pred RX     -probable granulomas due to Sarcoidosis HYPERTENSION (ICD-401.9) HYPERLIPIDEMIA (ICD-272.4) GERD (ICD-530.81) DIABETES MELLITUS, TYPE II (ICD-250.00) ANEMIA-NOS (ICD-285.9)    Past Surgical History: Ventral Hernia Repair Nissen Fundoplication Cholecystectomy Hysterectomy  Social History: Married Never Smoked Regular exercise-no Alcohol use-no  Family History: father-alive MI, DM mother alive and well uncle with SARCOID no other complaints in a complete ROS       Updated Prior Medication List: PROAIR HFA 108 (90  BASE) MCG/ACT  AERS (ALBUTEROL SULFATE) 2 puffs three times a day as needed ATENOLOL-CHLORTHALIDONE 50-25 MG TABS (ATENOLOL-CHLORTHALIDONE) Take 1 tablet by mouth once a day BUDEPRION XL 300 MG TB24 (BUPROPION HCL) Take 1 tablet by mouth once a day EFFEXOR XR 75 MG CP24 (VENLAFAXINE HCL) Take 1 capsule by mouth once a day LORAZEPAM 1 MG TABS (LORAZEPAM) prn NOVOLOG FLEXPEN 100 UNIT/ML SOLN (INSULIN ASPART) three times a day as directed ONETOUCH ULTRA TEST  STRP (GLUCOSE BLOOD) tid POTASSIUM CHLORIDE CRYS CR 20 MEQ TBCR (POTASSIUM CHLORIDE CRYS CR) Take 1 tablet by mouth two times a day PREDNISONE 5 MG TABS (PREDNISONE) Take 1 tablet by mouth once a day ZEGERID 40-1100 MG CAPS (OMEPRAZOLE-SODIUM BICARBONATE) 2 by mouth daily CRESTOR 20 MG TABS (ROSUVASTATIN CALCIUM) 1 by mouth once daily QVAR 80 MCG/ACT  AERS (BECLOMETHASONE DIPROPIONATE) two puffs twice daily PEN NEEDLES 31G X 8 MM  MISC (INSULIN PEN NEEDLE) USE AS DIRECTED REMICADE 100 MG  SOLR (INFLIXIMAB) Injection every 6 weeks FLUTICASONE PROPIONATE 50 MCG/ACT  SUSP (FLUTICASONE PROPIONATE) 2 sprays each nostril once daily FUROSEMIDE 20 MG TABS (FUROSEMIDE) Take one (1) by mouth daily VICODIN 5-500 MG  TABS (HYDROCODONE-ACETAMINOPHEN) One tab every 6 hours as needed VITAMIN D 1000 UNIT  CAPS (CHOLECALCIFEROL) two times a day BENICAR 40 MG  TABS (OLMESARTAN MEDOXOMIL) 1 by mouth once daily PREMARIN 0.9 MG TABS (ESTROGENS CONJUGATED) Take 1 tablet by mouth once a day SLOW RELEASE IRON 47.5 MG CR-TABS (FERROUS SULFATE) once daily MULTIPLE VITAMIN  TABS (MULTIPLE VITAMIN) once daily TUSSIONEX PENNKINETIC ER 8-10 MG/5ML LQCR (CHLORPHENIRAMINE-HYDROCODONE) Take 1 teaspoon every 12 hrs as needed  cough RESTASIS 0.05 % EMUL (CYCLOSPORINE) 1 drop each eye daily FISH OIL 500 MG CAPS (OMEGA-3 FATTY ACIDS) once daily LOTEMAX 0.5 % SUSP (LOTEPREDNOL ETABONATE) 1 drop each eye every 6 hours as needed UTAC 500-500 MG TABS (METHENAMINE MAND-SOD  PHOSPHATE) two times a day  Current Allergies (reviewed today): ! METHOTREXATE      Physical Exam  General:     cushingoid faciesobese.   Head:     normocephalic and atraumatic Eyes:     PERRLA/EOM intact; conjunctiva and sclera clear Ears:     TMs intact and clear with normal canals Nose:     no deformity, discharge, inflammation, or lesions Neck:     no masses, thyromegaly, or abnormal cervical nodes Chest Wall:     no deformities noted Lungs:     normal respiratory effort, no intercostal retractions, no accessory muscle use, no dullness, and no fremitus.   Abdomen:     soft and non-tender.  obese Msk:     No deformity or scoliosis noted of thoracic or lumbar spine.   Pulses:     R radial normal and L radial normal.   Extremities:     no clubbing, cyanosis, edema, or deformity noted Skin:     turgor normal and color normal.   Psych:     normally interactive and good eye contact.      Impression & Recommendations:  Problem # 1:  SHOULDER PAIN, RIGHT (ICD-719.41) patient will be scheduled for surgery with dr. Thurston Hole risk of surgery is higher than age matched controls but her risk is acceptable  Her updated medication list for this problem includes:    Vicodin 5-500 Mg Tabs (Hydrocodone-acetaminophen) ..... One tab every 6 hours as needed  cc: wainer   Problem # 2:  SARCOIDOSIS (ICD-135) f/u dr Delford Field  Problem # 3:  HYPERTENSION (ICD-401.9) controlled Her updated medication list for this problem includes:    Atenolol-chlorthalidone 50-25 Mg Tabs (Atenolol-chlorthalidone) .[name]... Take 1 tablet by mouth once a day    Furosemide 20 Mg Tabs (Furosemide) .[name]... Take one (1) by mouth daily    Benicar 40 Mg Tabs (Olmesartan medoxomil) .[name]... 1 by mouth once daily  BP today: 106/66 Prior BP: 100/62 (09/30/2008)  Prior 10 Yr Risk Heart Disease: 8 % (09/21/2006)  Labs Reviewed: Creat: 0.8 (09/30/2008) Chol: 255 (09/30/2008)   HDL: 43.5 (09/30/2008)   LDL: 157.5  (09/30/2008)   TG: 376 (09/30/2008)   Problem # 4:  DIABETES MELLITUS, TYPE II (ICD-250.00) controlled Her updated medication list for this problem includes:    Novolog Flexpen 100 Unit/ml Soln (Insulin aspart) .[name]... Three times a day as directed    Benicar 40 Mg Tabs (Olmesartan medoxomil) .[name]... 1 by mouth once daily  Labs Reviewed: HgBA1c: 6.7 (09/30/2008)   Creat: 0.8 (09/30/2008)   Microalbumin: 0.3 (05/24/2007)  Last Eye Exam: normal-pt's report (07/11/2008)   Complete Medication List: 1)  Proair Hfa 108 (90 Base) Mcg/act Aers (Albuterol sulfate) .... 2 puffs three times a day as needed 2)  Atenolol-chlorthalidone 50-25 Mg Tabs (Atenolol-chlorthalidone) .... Take 1 tablet by mouth once a day 3)  Budeprion Xl 300 Mg Tb24 (Bupropion hcl) .... Take 1 tablet by mouth once a day 4)  Effexor Xr 75 Mg Cp24 (Venlafaxine hcl) .... Take 1 capsule by mouth once a day 5)  Lorazepam 1 Mg Tabs (Lorazepam) .... Prn 6)  Novolog Flexpen 100 Unit/ml Soln (Insulin aspart) .... Three times a day as directed 7)  Onetouch Ultra Test  Strp (Glucose blood) .... Tid 8)  Potassium Chloride Crys Cr 20 Meq Tbcr (Potassium chloride crys cr) .... Take 1 tablet by mouth two times a day 9)  Prednisone 5 Mg Tabs (Prednisone) .... Take 1 tablet by mouth once a day 10)  Zegerid 40-1100 Mg Caps (Omeprazole-sodium bicarbonate) .... 2 by mouth daily 11)  Crestor 20 Mg Tabs (Rosuvastatin calcium) .[name].. 1 by mouth once daily 12)  Qvar 80 Mcg/act Aers (Beclomethasone dipropionate) .... Two puffs twice daily 13)  Pen Needles 31g X 8 Mm Misc (Insulin pen needle) .... Use as directed 14)  Remicade 100 Mg Solr (Infliximab) .... Injection every 6 weeks 15)  Fluticasone Propionate 50 Mcg/act Susp (Fluticasone propionate) .... 2 sprays each nostril once daily 16)  Furosemide 20 Mg Tabs (Furosemide) .... Take one (1) by mouth daily 17)  Vicodin 5-500 Mg Tabs (Hydrocodone-acetaminophen) .... One tab every 6 hours as needed 18)   Vitamin D 1000 Unit Caps (Cholecalciferol) .... Two times a day 19)  Benicar 40 Mg Tabs (Olmesartan medoxomil) .[name].. 1 by mouth once daily 20)  Premarin 0.9 Mg Tabs (Estrogens conjugated) .... Take 1 tablet by mouth once a day 21)  Slow Release Iron 47.5 Mg Cr-tabs (Ferrous sulfate) .... Once daily 22)  Multiple Vitamin Tabs (Multiple vitamin) .... Once daily 23)  Tussionex Pennkinetic Er 8-10 Mg/61ml Lqcr (Chlorpheniramine-hydrocodone) .... Take 1 teaspoon every 12 hrs as needed cough 24)  Restasis 0.05 % Emul (Cyclosporine) .[name].. 1 drop each eye daily 25)  Fish Oil 500 Mg Caps (Omega-3 fatty acids) .... Once daily 26)  Lotemax 0.5 % Susp (Loteprednol etabonate) .[name].. 1 drop each eye every 6 hours as needed 27)  Utac 500-500 Mg Tabs (Methenamine mand-sod phosphate) .... Two times a day  Other Orders: EKG w/ Interpretation (93000)      EKG Report  Procedure date:  10/30/2008  Findings:      normal EKG

## 2010-11-12 NOTE — Assessment & Plan Note (Signed)
Summary: UTI CHECK/PS   Vital Signs:  Patient Profile:   50 Years Old Female Height:     66 inches Weight:      289 pounds Temp:     97.9 degrees F oral Pulse rate:   60 / minute Pulse rhythm:   regular BP sitting:   120 / 80  (left arm) Cuff size:   regular  Vitals Entered By: Kern Reap CMA (May 08, 2008 2:05 PM)                 Referred by:  Chase Picket PCP:  Dr. Birdie Sons  Chief Complaint:  uti and Dysuria.  History of Present Illness:  Dysuria      [name] who presents with Dysuria.  The patient reports burning with urination, urinary frequency, and urgency, but denies hematuria, vaginal discharge, vaginal itching, vaginal sores, and penile discharge.  The patient denies the following associated symptoms: nausea, vomiting, fever, shaking chills, flank pain, abdominal pain, back pain, pelvic pain, and arthralgias.  Risk factors for urinary tract infection include prior antibiotics.  The patient denies the following risk factors: diabetes, immunosuppression, history of GU anomaly, history of pyelonephritis, pregnancy, history of STD, and analgesic abuse.  History is significant for recent UTI and > 3 UTIs in one year.    Past Medical History: Anemia-NOS Diabetes mellitus, type II GERD Hyperlipidemia Hypertension pulmonary nodule OSA Vocal Cord dysfunction with recurrent cough Asthma vs Vocal cord dysfundtion SARCOID   Past Surgical History: Ventral Hernia Repair Nissen Fundoplication Cholecystectomy Hysterectomy  Social History: Married Never Smoked Regular exercise-no Alcohol use-no  Family History: father-alive MI, DM mother alive and well uncle with SARCOID  no other complaints in a complete ROS     Prior Medications Reviewed Using: Patient Recall  Prior Medication List:  PROAIR HFA 108 (90 BASE) MCG/ACT  AERS (ALBUTEROL SULFATE) 2 puffs three times a day as needed ATENOLOL-CHLORTHALIDONE 50-25 MG TABS  (ATENOLOL-CHLORTHALIDONE) 1/1/2 daily BUDEPRION XL 300 MG TB24 (BUPROPION HCL) Take 1 tablet by mouth once a day EFFEXOR XR 75 MG CP24 (VENLAFAXINE HCL) Take 1 capsule by mouth once a day LORAZEPAM 1 MG TABS (LORAZEPAM) prn NOVOLOG FLEXPEN 100 UNIT/ML SOLN (INSULIN ASPART) three times a day as directed ONETOUCH ULTRA TEST  STRP (GLUCOSE BLOOD) tid POTASSIUM CHLORIDE CRYS CR 20 MEQ TBCR (POTASSIUM CHLORIDE CRYS CR) Take 1 tablet by mouth two times a day PREDNISONE 10 MG  TABS (PREDNISONE) 1 1/2 once daily ZEGERID 40-1100 MG CAPS (OMEPRAZOLE-SODIUM BICARBONATE) 2 by mouth daily CRESTOR 20 MG TABS (ROSUVASTATIN CALCIUM) 1 by mouth once daily QVAR 80 MCG/ACT  AERS (BECLOMETHASONE DIPROPIONATE) two puffs twice daily PEN NEEDLES 31G X 8 MM  MISC (INSULIN PEN NEEDLE) USE AS DIRECTED PREMARIN 0.3 MG TABS (ESTROGENS CONJUGATED) take two daily REMICADE 100 MG  SOLR (INFLIXIMAB) Injection every 6 weeks FLUTICASONE PROPIONATE 50 MCG/ACT  SUSP (FLUTICASONE PROPIONATE) 2 sprays each nostril once daily FUROSEMIDE 20 MG TABS (FUROSEMIDE) Take one (1) by mouth daily VICODIN 5-500 MG  TABS (HYDROCODONE-ACETAMINOPHEN) One tab every 6 hours as needed VITAMIN D 1000 UNIT  CAPS (CHOLECALCIFEROL) two times a day BENICAR 40 MG  TABS (OLMESARTAN MEDOXOMIL) 1 by mouth once daily   Current Allergies (reviewed today): ! METHOTREXATE      Physical Exam  General:     Well-developed,well-nourished,in no acute distress; alert,appropriate and cooperative throughout examination Head:     normocephalic and atraumatic.   Abdomen:  Bowel sounds positive,abdomen soft and non-tender without masses, organomegaly or hernias noted.  obese Genitalia:     no suprapubic or falnk tendernes to palpation    Impression & Recommendations:  Problem # 1:  UTI (ICD-599.0) Recurrent urology appt Orders: UA Dipstick w/o Micro (manual) (40981) T-Culture, Urine ([id number]) Urology Referral (Urology)  Her updated  medication list for this problem includes:    Cipro 500 Mg Tabs (Ciprofloxacin hcl) .[name]... Take one (1) by mouth twice a day   Complete Medication List: 1)  Proair Hfa 108 (90 Base) Mcg/act Aers (Albuterol sulfate) .... 2 puffs three times a day as needed 2)  Atenolol-chlorthalidone 50-25 Mg Tabs (Atenolol-chlorthalidone) .... 1/1/2 daily 3)  Budeprion Xl 300 Mg Tb24 (Bupropion hcl) .... Take 1 tablet by mouth once a day 4)  Effexor Xr 75 Mg Cp24 (Venlafaxine hcl) .... Take 1 capsule by mouth once a day 5)  Lorazepam 1 Mg Tabs (Lorazepam) .... Prn 6)  Novolog Flexpen 100 Unit/ml Soln (Insulin aspart) .... Three times a day as directed 7)  Onetouch Ultra Test Strp (Glucose blood) .... Tid 8)  Potassium Chloride Crys Cr 20 Meq Tbcr (Potassium chloride crys cr) .... Take 1 tablet by mouth two times a day 9)  Prednisone 10 Mg Tabs (Prednisone) .[name].. 1 1/2 once daily 10)  Zegerid 40-1100 Mg Caps (Omeprazole-sodium bicarbonate) .... 2 by mouth daily 11)  Crestor 20 Mg Tabs (Rosuvastatin calcium) .[name].. 1 by mouth once daily 12)  Qvar 80 Mcg/act Aers (Beclomethasone dipropionate) .... Two puffs twice daily 13)  Pen Needles 31g X 8 Mm Misc (Insulin pen needle) .... Use as directed 14)  Premarin 0.3 Mg Tabs (Estrogens conjugated) .... Take two daily 15)  Remicade 100 Mg Solr (Infliximab) .... Injection every 6 weeks 16)  Fluticasone Propionate 50 Mcg/act Susp (Fluticasone propionate) .... 2 sprays each nostril once daily 17)  Furosemide 20 Mg Tabs (Furosemide) .... Take one (1) by mouth daily 18)  Vicodin 5-500 Mg Tabs (Hydrocodone-acetaminophen) .... One tab every 6 hours as needed 19)  Vitamin D 1000 Unit Caps (Cholecalciferol) .... Two times a day 20)  Benicar 40 Mg Tabs (Olmesartan medoxomil) .[name].. 1 by mouth once daily 21)  Cipro 500 Mg Tabs (Ciprofloxacin hcl) .... Take one (1) by mouth twice a day    Prescriptions: CIPRO 500 MG TABS (CIPROFLOXACIN HCL) Take one (1) by mouth twice a day  #14 x  0   Entered and Authorized by:   Birdie Sons MD   Signed by:   Birdie Sons MD on 05/08/2008   Method used:   Electronically sent to ...       [facility]       [address]       [place], [place]  [address]       Ph: [phone]       Fax: [phone]   RxID:   [id number]  ] Laboratory Results   Urine Tests    Routine Urinalysis   Color: yellow Appearance: Clear Glucose: negative   (Normal Range: Negative) Bilirubin: negative   (Normal Range: Negative) Ketone: negative   (Normal Range: Negative) Spec. Gravity: 1.015   (Normal Range: 1.003-1.035) Blood: negative   (Normal Range: Negative) pH: 7.0   (Normal Range: 5.0-8.0) Protein: negative   (Normal Range: Negative) Urobilinogen: 0.2   (Normal Range: 0-1) Nitrite: negative   (Normal Range: Negative) Leukocyte Esterace: negative   (Normal Range: Negative)  Comments: Milica Zimonjic  May 08, 2008 2:53 PM

## 2010-11-12 NOTE — Progress Notes (Signed)
Summary: Potassium refill  Phone Note Call from Patient   Summary of Call: Patient informed of script at pharmacy. Initial call taken by: Darra Lis RMA,  May 09, 2009 8:55 AM    Prescriptions: POTASSIUM CHLORIDE CRYS CR 20 MEQ TBCR (POTASSIUM CHLORIDE CRYS CR) 1 three times a day  #100 x 3   Entered by:   Darra Lis RMA   Authorized by:   Birdie Sons MD   Signed by:   Darra Lis RMA on 05/09/2009   Method used:   Electronically to        [facility] (retail)       [address]       [place], [place]  [address]       Ph: [phone]       Fax: [phone]   RxID:   [id number]

## 2010-11-12 NOTE — Assessment & Plan Note (Signed)
Summary: Pulmonary OV   Referred by:  Chase Picket PCP:  Dr. Birdie Sons  Chief Complaint:  SOB w/ exertion and swelling in feet and legs.  History of Present Illness: This is a 50 year old, white female with chronic pulmonary sarcoidosis.  This is been manifested by cyclic cough and mediastinal adenopathy with associated chest pain.  There are also stable pulmonary nodules.  The patient's-day-old systemic corticosteroids.  She also had adverse side effects to a raise the.  On this basis we referred the patient in November to Dr. Chase Picket, for Remicade injections.  Since initiating these injections in November.  The patient now is seeing improvement.  There is less cough.  There is left shoulder and joint pain.  She does note increased lower extremity edema.  Her shortness of breath is less.  She did receive an antibiotic and a Depo-Medrol injection at an urgent care center two weeks ago.  She has less mucus production at this time.  Overall, she is at a stable baseline.  Patient is here for pulmonary follow-up.       Current Allergies: ! METHOTREXATE  Past Medical History:    Reviewed history from 09/15/2007 and no changes required:       Anemia-NOS       Diabetes mellitus, type II       GERD       Hyperlipidemia       Hypertension       pulmonary nodule       OSA       Vocal Cord dysfunction with recurrent cough       Asthma vs Vocal cord dysfundtion       SARCOID              Current Meds:        PROAIR HFA 108 (90 BASE) MCG/ACT  AERS (ALBUTEROL SULFATE) 2 puffs three times a day as needed       ATENOLOL-CHLORTHALIDONE 50-25 MG TABS (ATENOLOL-CHLORTHALIDONE) 1/1/2 daily       BUDEPRION XL 300 MG TB24 (BUPROPION HCL) Take 1 tablet by mouth once a day       EFFEXOR XR 75 MG CP24 (VENLAFAXINE HCL) Take 1 capsule by mouth once a day       LORAZEPAM 1 MG TABS (LORAZEPAM) prn       NOVOLOG FLEXPEN 100 UNIT/ML SOLN (INSULIN ASPART) three times a day as directed       ONETOUCH  ULTRA TEST  STRP (GLUCOSE BLOOD) tid       POTASSIUM CHLORIDE CRYS CR 20 MEQ TBCR (POTASSIUM CHLORIDE CRYS CR) Take 1 tablet by mouth once a day       PREDNISONE 10 MG  TABS (PREDNISONE) 1 once daily       REGLAN 10 MG TABS (METOCLOPRAMIDE HCL) Take 1 tablet by mouth twice a day       ZEGERID 40-1100 MG CAPS (OMEPRAZOLE-SODIUM BICARBONATE) 2 by mouth daily       CRESTOR 20 MG TABS (ROSUVASTATIN CALCIUM) 1 by mouth once daily       QVAR 80 MCG/ACT  AERS (BECLOMETHASONE DIPROPIONATE) two puffs twice daily       PEN NEEDLES 31G X 8 MM  MISC (INSULIN PEN NEEDLE) USE AS DIRECTED NEED F/U APPT B-4 ADDTIONAL REFILLS       PREMARIN 0.3 MG TABS (ESTROGENS CONJUGATED) once daily       REMICADE 100 MG  SOLR (INFLIXIMAB) Injection every 6 weeks  FLUTICASONE PROPIONATE 50 MCG/ACT  SUSP (FLUTICASONE PROPIONATE) 2 sprays each nostril once daily         Past Surgical History:    Reviewed history from 08/21/2006 and no changes required:       Ventral Hernia Repair       Nissen Fundoplication       Cholecystectomy       Hysterectomy   Family History:    Reviewed history from 09/15/2007 and no changes required:       father-alive MI, DM       mother alive and well       uncle with SARCOID  Social History:    Reviewed history from 05/31/2007 and no changes required:       Married       Never Smoked       Regular exercise-no       Alcohol use-no    Review of Systems      See HPI   Vital Signs:  Patient Profile:   50 Years Old Female Height:     66 inches Weight:      295 pounds O2 Sat:      90 % O2 treatment:    Room Air Temp:     98.3 degrees F oral Pulse rate:   86 / minute BP sitting:   130 / 80  (left arm)  Vitals Entered By: Michel Bickers CMA (January 16, 2008 4:22 PM)             Comments Medications reviewed with patient ..................................................................Marland KitchenMichel Bickers CMA  January 16, 2008 4:24 PM     Physical Exam  Gen: obese cushingoid  WF     in NAD    NCAT Heent:  no jvd, no TMG, no cervical LNademopathy, orophyx clear,  nares with clear watery drainage. Cor: RRR nl s1/s2  no s3/s4  no m r h g Abd: soft NT BSA   no masses  No HSM  no rebound or guarding Ext perfused with no c v e v.d Neuro: intact, moves all 4s, CN II-XII intact, DTRs intact Chest: distant BS  no wheezes, rales, rhonchi   no egophony  no consolidative breath sounds, mild hyperresonance to percussion Skin: clear  Genital/Rectal :deferred      Problem # 1:  SARCOIDOSIS (ICD-135) Assessment: Improved Severe pulmonary sarcoidosis stage III, with joint and pulmonary involvement.  No other systemic involvement has been discerned. Plan maintain Remicade per rheumatology on an every 6 week basis.  Will reduce prednisone to 10 mg a day.  Continue Qvar on a topical basis.  Return for recheck in three months   check labs Her updated medication list for this problem includes:    Proair Hfa 108 (90 Base) Mcg/act Aers (Albuterol sulfate) .[name]... 2 puffs three times a day as needed    Prednisone 10 Mg Tabs (Prednisone) .[name]... 1 once daily    Qvar 80 Mcg/act Aers (Beclomethasone dipropionate) .[name]..[name] Two puffs twice daily  Orders: Est. Patient Level IV ([address]) TLB-CBC Platelet - w/Differential (85025-CBCD)   Medications Added to Medication List This Visit: 1)  Proair Hfa 108 (90 Base) Mcg/act Aers (Albuterol sulfate) .... 2 puffs three times a day as needed 2)  Prednisone 10 Mg Tabs (Prednisone) .[name].. 1 once daily 3)  Remicade 100 Mg Solr (Infliximab) .... Injection every 6 weeks   Patient Instructions: 1)  Reduce Prednisone to 10mg  daily 2)  No other medication changes 3)  Will obtain a  CBC with other lab draws 4)  Please schedule a follow-up appointment in 3 months.    ]

## 2010-11-12 NOTE — Assessment & Plan Note (Signed)
Summary: 6 WKS ROV/MM/PT RSC/CJR   Vital Signs:  Patient profile:   50 year old female Height:      66 inches (167.64 cm) Weight:      233.31 pounds (106.05 kg) Temp:     97.6 degrees F (36.44 degrees C) oral Pulse rate:   75 / minute BP sitting:   122 / 82  (left arm) Cuff size:   large  Vitals Entered By: Josph Macho RMA (April 23, 2010 10:59 AM) CC: 6 week office visit/ pt states she went back on Furosemide due to feet swelling/ CF Is Patient Diabetic? Yes   Primary Care Provider:  Birdie Sons, MD   CC:  6 week office visit/ pt states she went back on Furosemide due to feet swelling/ CF.  History of Present Illness:  Follow-Up Visit      [name] who presents for Follow-up visit.  The patient denies chest pain and palpitations.  Since the last visit the patient notes no new problems or concerns-- see dr wright's note...headaches are some better.  The patient reports taking meds as prescribed.  When questioned about possible medication side effects, the patient notes none.    Current Medications (verified): 1)  Atenolol-Chlorthalidone 50-25 Mg Tabs (Atenolol-Chlorthalidone) .... Take 1 Tablet By Mouth Once A Day 2)  Effexor Xr 75 Mg Cp24 (Venlafaxine Hcl) .... Take 1 Tablet By Mouth Once A Day 3)  Lorazepam 1 Mg Tabs (Lorazepam) .... One Every 6 Hrs As Needed 4)  Remicade 100 Mg  Solr (Infliximab) .... Injection Every 6 Weeks 5)  Omnaris 50 Mcg/act Susp (Ciclesonide) .... One Spray Each Nostril Once Daily 6)  Vicodin 5-500 Mg  Tabs (Hydrocodone-Acetaminophen) .... One Tab Every 6 Hours As Needed 7)  Estrace 1 Mg Tabs (Estradiol) .... Take 1 Tablet By Mouth Once A Day 8)  Restasis 0.05 % Emul (Cyclosporine) .[name].. 1 Drop Each Eye Daily 9)  Lotemax 0.5 % Susp (Loteprednol Etabonate) .[name].. 1 Drop Each Eye Every 6 Hours As Needed 10)  Simvastatin 40 Mg Tabs (Simvastatin) .... Take One Tablet At Bedtime Start One Week After Completing Diflucan 11)  Levbid 0.375  Mg Xr12h-Tab (Hyoscyamine Sulfate) .[name].. 1 By Mouth Two Times A Day As Needed 12)  Prednisone 5 Mg Tabs (Prednisone) .... Take 1 Tablet By Mouth Every Other Day 13)  Cpap 7 Cm 14)  Nystatin-Triamcinolone 100000-0.1 Unit/gm-% Crea (Nystatin-Triamcinolone) .... Apply Two Times A Day To Affected Area 15)  Hydromet 5-1.5 Mg/79ml Syrp (Hydrocodone-Homatropine) .[name].. 1 Tsp Three Times A Day As Needed Cough 16)  Valtrex 1 Gm Tabs (Valacyclovir Hcl) .... Take 1 Tablet By Mouth Three Times A Day 17)  Tussionex Pennkinetic Er 8-10 Mg/7ml Lqcr (Chlorpheniramine-Hydrocodone) .[name].. 1 Tsp Every 6 Hours As Needed 18)  Tussionex Pennkinetic Er 8-10 Mg/92ml Lqcr (Chlorpheniramine-Hydrocodone) .[name].. 1 Tsp Every 12 Hours As Needed For Cough 19)  Moviprep 100 Gm  Solr (Peg-Kcl-Nacl-Nasulf-Na Asc-C) .... As Per Prep Instructions. 20)  Furosemide 40 Mg Tabs (Furosemide) .... Qd  Allergies (verified): 1)  ! Methotrexate  Past History:  Past Medical History: Last updated: 10/13/2009 EDEMA (ICD-782.3) HEADACHE (ICD-784.0) MENOPAUSAL SYNDROME (ICD-627.2) UTI (ICD-599.0) SARCOIDOSIS (ICD-135)   -CT Chest 6/08 improvement in Mediastinal LAN   -11/08 start of remecade Rx per [facility] PULMONARY NODULE (ICD-518.89)    -CT Chest 6/08 reduction in size of nodules due to Arava and Pred RX     -probable granulomas due to Sarcoidosis HYPERTENSION (ICD-401.9)  HYPERLIPIDEMIA (ICD-272.4) GERD (ICD-530.81) Sleep APnea DIABETES MELLITUS, TYPE II (ICD-250.00) ANEMIA-NOS (ICD-285.9)  Fundic Gland Polyp  Past Surgical History: Last updated: 08/21/2006 Ventral Hernia Repair Nissen Fundoplication Cholecystectomy Hysterectomy  Family History: Last updated: 10/20/2009 father-alive MI, DM mother alive and well uncle with SARCOID No FH of Colon Cancer: Family History Emphysema-Mother, Father  Family History Asthma-Asthma Family History COPD -mother  Social History: Last updated: 10/20/2009 Married,  live with husband Never Smoked Regular exercise-no Alcohol use-no Illicit Drug Use - no  Risk Factors: Exercise: no (05/31/2007)  Risk Factors: Smoking Status: never (04/07/2010)  Physical Exam  General:  alert and well-developed.   Head:  normocephalic and atraumatic.   Eyes:  pupils equal and pupils round.   Ears:  R ear normal and L ear normal.   Neck:  supple.   Lungs:  normal respiratory effort, no intercostal retractions, no dullness, and no fremitus.   Heart:  normal rate and regular rhythm.   Abdomen:  soft and non-tender.   Msk:  No deformity or scoliosis noted of thoracic or lumbar spine.   Neurologic:  cranial nerves II-XII intact and gait normal.     Impression & Recommendations:  Problem # 1:  OBESITY (ICD-278.00) she has made dramatic improvement! congratulated and encouraged to keep losing weight  Problem # 2:  HYPERTENSION (ICD-401.9) controlled continue current medications  may b e able to decrease meds futher with continued weight loss Her updated medication list for this problem includes:    Atenolol-chlorthalidone 50-25 Mg Tabs (Atenolol-chlorthalidone) .[name]... Take 1 tablet by mouth once a day    Furosemide 40 Mg Tabs (Furosemide) ..... Qd  BP today: 122/82 Prior BP: 138/74 (04/07/2010)  Prior 10 Yr Risk Heart Disease: 8 % (09/21/2006)  Labs Reviewed: K+: 4.1 (04/15/2010) Creat: : 0.6 (04/15/2010)   Chol: 190 (04/15/2010)   HDL: 41.70 (04/15/2010)   LDL: DEL (09/30/2008)   TG: 333.0 (04/15/2010)  Problem # 3:  HYPERLIPIDEMIA (ICD-272.4) controlled adequatedly for now continue current medications  Her updated medication list for this problem includes:    Simvastatin 40 Mg Tabs (Simvastatin) .[name]... Take one tablet at bedtime start one week after completing diflucan  Labs Reviewed: SGOT: 17 (04/15/2010)   SGPT: 11 (04/15/2010)  Prior 10 Yr Risk Heart Disease: 8 % (09/21/2006)   HDL:41.70 (04/15/2010), 44.10 (12/02/2009)  LDL:DEL (09/30/2008),  DEL (06/05/2008)  Chol:190 (04/15/2010), 171 (12/02/2009)  Trig:333.0 (04/15/2010), 314.0 (12/02/2009)  Problem # 4:  DIABETES MELLITUS, TYPE II (ICD-250.00) much improved and on no meds all related to purposeful weight loss Labs Reviewed: Creat: 0.6 (04/15/2010)     Last Eye Exam: normalpt's report (10/11/2009) Reviewed HgBA1c results: 6.8 (04/15/2010)  6.6 (12/02/2009)  Complete Medication List: 1)  Atenolol-chlorthalidone 50-25 Mg Tabs (Atenolol-chlorthalidone) .... Take 1 tablet by mouth once a day 2)  Effexor Xr 75 Mg Cp24 (Venlafaxine hcl) .... Take 1 tablet by mouth once a day 3)  Lorazepam 1 Mg Tabs (Lorazepam) .... One every 6 hrs as needed 4)  Remicade 100 Mg Solr (Infliximab) .... Injection every 6 weeks 5)  Omnaris 50 Mcg/act Susp (Ciclesonide) .... One spray each nostril once daily 6)  Vicodin 5-500 Mg Tabs (Hydrocodone-acetaminophen) .... One tab every 6 hours as needed 7)  Estrace 1 Mg Tabs (Estradiol) .... Take 1 tablet by mouth once a day 8)  Restasis 0.05 % Emul (Cyclosporine) .[name].. 1 drop each eye daily 9)  Lotemax 0.5 % Susp (Loteprednol etabonate) .[name].. 1 drop each eye every 6 hours as needed  10)  Simvastatin 40 Mg Tabs (Simvastatin) .... Take one tablet at bedtime start one week after completing diflucan 11)  Levbid 0.375 Mg Xr12h-tab (Hyoscyamine sulfate) .[name].. 1 by mouth two times a day as needed 12)  Prednisone 5 Mg Tabs (Prednisone) .... Take 1 tablet by mouth every other day 13)  Cpap 7 Cm  14)  Nystatin-triamcinolone 100000-0.1 Unit/gm-% Crea (Nystatin-triamcinolone) .... Apply two times a day to affected area 15)  Hydromet 5-1.5 Mg/76ml Syrp (Hydrocodone-homatropine) .[name].. 1 tsp three times a day as needed cough 16)  Valtrex 1 Gm Tabs (Valacyclovir hcl) .... Take 1 tablet by mouth three times a day 17)  Tussionex Pennkinetic Er 8-10 Mg/69ml Lqcr (Chlorpheniramine-hydrocodone) .[name].. 1 tsp every 6 hours as needed 18)  Tussionex Pennkinetic Er 8-10 Mg/64ml Lqcr  (Chlorpheniramine-hydrocodone) .[name].. 1 tsp every 12 hours as needed for cough 19)  Moviprep 100 Gm Solr (Peg-kcl-nacl-nasulf-na asc-c) .... As per prep instructions. 20)  Furosemide 40 Mg Tabs (Furosemide) .... Qd  Other Orders: Venipuncture (16109) TLB-Lipase (83690-LIPASE) TLB-CBC Platelet - w/Differential (85025-CBCD) TLB-Sedimentation Rate (ESR) (85652-ESR)  Patient Instructions: 1)  Please schedule a follow-up appointment in 1 month.

## 2010-11-12 NOTE — Consult Note (Signed)
Summary: [facility]  [facility]   Imported By: Kassie Mends 06/12/2008 09:19:44  _____________________________________________________________________  External Attachment:    Type:   Image     Comment:   [facility]

## 2010-11-12 NOTE — Progress Notes (Signed)
Summary: yeast infection?  Phone Note Call from Patient   Caller: Spouse Call For: Birdie Sons MD Summary of Call: Pt's husband is calling to ask Dr. Cato Mulligan if he would send RX for "yeast" under pt's breasts.  [name])  Pt states she has had this before and knows it is yeast. [phone] Initial call taken by: Lynann Beaver CMA,  January 29, 2010 11:43 AM  Follow-up for Phone Call        see rx Follow-up by: Birdie Sons MD,  January 29, 2010 12:38 PM  Additional Follow-up for Phone Call Additional follow up Details #1::        Patient notified.  Additional Follow-up by: Gladis Riffle, RN,  January 29, 2010 1:42 PM    New/Updated Medications: NYSTATIN-TRIAMCINOLONE 100000-0.1 UNIT/GM-% CREA (NYSTATIN-TRIAMCINOLONE) apply two times a day to affected area Prescriptions: NYSTATIN-TRIAMCINOLONE 100000-0.1 UNIT/GM-% CREA (NYSTATIN-TRIAMCINOLONE) apply two times a day to affected area  #30 grams x 1   Entered and Authorized by:   Birdie Sons MD   Signed by:   Birdie Sons MD on 01/29/2010   Method used:   Electronically to        [facility] (retail)       [address]       [place], [place]  [address]       Ph: [phone]       Fax: [phone]   RxID:   [id number]

## 2010-11-12 NOTE — Progress Notes (Signed)
Summary: NEEDS TO BE WORKED IN WITH SWORDS   Phone Note Call from Patient Call back at [phone]   Caller: PT LIVE Call For: SWORDS Summary of Call: BAD COUGH HOARSE, NOT COUGHING UP ANYTHING.  NEEDS TO BE WORKED IN WITH SWORDS OR HAVE SOMETHING CALLED IN  Initial call taken by: Roselle Locus,  February 05, 2008 9:40 AM  Follow-up for Phone Call        see me tomorrow at 1:45 pm Follow-up by: Birdie Sons MD,  February 05, 2008 4:34 PM  Additional Follow-up for Phone Call Additional follow up Details #1::        scheduled appt. Additional Follow-up by: Lynann Beaver CMA,  February 05, 2008 4:57 PM

## 2010-11-12 NOTE — Assessment & Plan Note (Signed)
Summary: Pulmonary OV   Copy to:  Chase Picket Primary Provider/Referring Provider:  Dr. Birdie Sons  CC:  3 month sarcoid follow-up.   Pt c/o heartburn.   Pt says the pharmacy has only been filling the Zegerid for once daily.   The pt's brother, age 52, and passed away from lung cancer 1 week ago.[name]  History of Present Illness: Pulmonary OV    This is a 50 year old, white female with chronic pulmonary sarcoidosis.  This is been manifested by cyclic cough and mediastinal adenopathy with associated chest pain.  There are also stable pulmonary nodules.  The patient has been intolerant of systemic corticosteroids.  She also had adverse side effects to Arava(leflunamide).    On this basis we referred the patient in November 2008  to Dr. Azzie Roup,  for Remicade injections.  There has been a positive response to Rx.  December 18, 2008 11:05 AM now on pred 5/d on remecade every 6weeks  The pts cough is better.  There is some chest pain.  Pt notes some sinus pressure.  Some postnasal drip.  On Flonase only. sob is persisting awakens in am with nausea and sweats notes sinus pressure and pain  notes postnasal drip on flonase only  April 08, 2009 11:44 AM Noting more pain in chest and arms.  Notes sl cough.  Difficult time with heat .  No mucous. Has dry cough.  Remecade has helped. Noting more low back pain with negative urologic work up per PCP. Pt denies any significant sore throat, nasal congestion or excess secretions, fever, chills, sweats, unintended weight loss, pleurtic or exertional chest pain, orthopnea PND, or leg swelling   Preventive Screening-Counseling & Management  Alcohol-Tobacco     Smoking Status: never   Current Medications (verified): 1)  Proair Hfa 108 (90 Base) Mcg/act  Aers (Albuterol Sulfate) .... 2 Puffs Three Times A Day As Needed 2)  Atenolol-Chlorthalidone 50-25 Mg Tabs (Atenolol-Chlorthalidone) .... Take 1 Tablet By Mouth Once A Day 3)  Budeprion Xl 300 Mg  Tb24 (Bupropion Hcl) .... Take 1 Tablet By Mouth Once A Day 4)  Effexor Xr 75 Mg Cp24 (Venlafaxine Hcl) .... Take 1 Capsule By Mouth Two Times A Day 5)  Lorazepam 1 Mg Tabs (Lorazepam) .... Prn 6)  Novolog Flexpen 100 Unit/ml Soln (Insulin Aspart) .... Three Times A Day As Directed 7)  Onetouch Ultra Test  Strp (Glucose Blood) .... Tid 8)  Potassium Chloride Crys Cr 20 Meq Tbcr (Potassium Chloride Crys Cr) .... Take 1 Tablet By Mouth Two Times A Day 9)  Prednisone 5 Mg Tabs (Prednisone) .... Take 1 Tablet By Mouth Once A Day 10)  Zegerid 40-1100 Mg Caps (Omeprazole-Sodium Bicarbonate) .... 2 By Mouth Daily 11)  Crestor 20 Mg Tabs (Rosuvastatin Calcium) .[name].. 1 By Mouth Once Daily 12)  Qvar 80 Mcg/act  Aers (Beclomethasone Dipropionate) .... Two Puffs Twice Daily 13)  Pen Needles 31g X 8 Mm  Misc (Insulin Pen Needle) .... Use As Directed 14)  Remicade 100 Mg  Solr (Infliximab) .... Injection Every 6 Weeks 15)  Fluticasone Propionate 50 Mcg/act  Susp (Fluticasone Propionate) .... 2 Sprays Each Nostril Once Daily 16)  Furosemide 20 Mg Tabs (Furosemide) .... Take One (1) By Mouth Daily 17)  Vicodin 5-500 Mg  Tabs (Hydrocodone-Acetaminophen) .... One Tab Every 6 Hours As Needed 18)  Vitamin D 1000 Unit  Caps (Cholecalciferol) .... Two Times A Day 19)  Benicar 40 Mg  Tabs (Olmesartan Medoxomil) .[name].. 1 By Mouth  Once Daily 20)  Estrace 1 Mg Tabs (Estradiol) .... Take 1 Tablet By Mouth Once A Day 21)  Slow Release Iron 47.5 Mg Cr-Tabs (Ferrous Sulfate) .... Once Daily 22)  Multiple Vitamin  Tabs (Multiple Vitamin) .... Once Daily 23)  Tussionex Pennkinetic Er 8-10 Mg/34ml Lqcr (Chlorpheniramine-Hydrocodone) .... Take 1 Teaspoon Every 12 Hrs As Needed Cough 24)  Restasis 0.05 % Emul (Cyclosporine) .[name].. 1 Drop Each Eye Daily 25)  Fish Oil 500 Mg Caps (Omega-3 Fatty Acids) .... Once Daily 26)  Lotemax 0.5 % Susp (Loteprednol Etabonate) .[name].. 1 Drop Each Eye Every 6 Hours As Needed 27)  Utac 500-500 Mg Tabs  (Methenamine Mand-Sod Phosphate) .... Two Times A Day 28)  Diclofenac Sodium 50 Mg Tbec (Diclofenac Sodium) .[name].. 1 By Mouth 2 Times Daily. Take With Food  Allergies (verified): 1)  ! Methotrexate  Past History:  Past medical, surgical, family and social histories (including risk factors) reviewed, and no changes noted (except as noted below).  Past Medical History: Reviewed history from 09/24/2008 and no changes required. EDEMA (ICD-782.3) HEADACHE (ICD-784.0) MENOPAUSAL SYNDROME (ICD-627.2) UTI (ICD-599.0) SARCOIDOSIS (ICD-135)   -CT Chest 6/08 improvement in Mediastinal LAN   -11/08 start of remecade Rx per [facility] PULMONARY NODULE (ICD-518.89)    -CT Chest 6/08 reduction in size of nodules due to Arava and Pred RX     -probable granulomas due to Sarcoidosis HYPERTENSION (ICD-401.9) HYPERLIPIDEMIA (ICD-272.4) GERD (ICD-530.81) DIABETES MELLITUS, TYPE II (ICD-250.00) ANEMIA-NOS (ICD-285.9)    Past Surgical History: Reviewed history from 08/21/2006 and no changes required. Ventral Hernia Repair Nissen Fundoplication Cholecystectomy Hysterectomy  Family History: Reviewed history from 09/15/2007 and no changes required. father-alive MI, DM mother alive and well uncle with SARCOID  Social History: Reviewed history from 05/31/2007 and no changes required. Married Never Smoked Regular exercise-no Alcohol use-no  Review of Systems       The patient complains of shortness of breath with activity, chest pain, and acid heartburn.  The patient denies shortness of breath at rest, productive cough, non-productive cough, coughing up blood, irregular heartbeats, indigestion, loss of appetite, weight change, abdominal pain, difficulty swallowing, sore throat, tooth/dental problems, headaches, nasal congestion/difficulty breathing through nose, sneezing, itching, ear ache, anxiety, depression, hand/feet swelling, joint stiffness or pain, rash, change in color of  mucus, and fever.    Vital Signs:  Patient profile:   50 year old female Height:      66 inches (167.64 cm) Weight:      277 pounds (125.91 kg) BMI:     44.87 O2 Sat:      98 % on Room air Temp:     97.4 degrees F (36.33 degrees C) oral Pulse rate:   107 / minute BP sitting:   116 / 80  (left arm) Cuff size:   large  Vitals Entered By: Michel Bickers CMA (April 08, 2009 11:33 AM)  O2 Flow:  Room air  Physical Exam  Additional Exam:  Gen: Pleasant, obese , in no distress ENT: no lesions, no postnasal drip Neck: No JVD, no TMG, no carotid bruits Lungs: no use of accessory muscles, no dullness to percussion,  distant BS,  no wheeze or rhonchi Cardiovascular: RRR, heart sounds normal, no murmurs or gallops, no peripheral edema Musculoskeletal: No deformities, no cyanosis or clubbing     X-ray Musculoskeletal  Procedure date:  04/08/2009  Findings:       Exam Type: L-Spine   Findings: The lumbar vertebrae are in normal alignment.  No compression deformity is seen.  Only mild degenerative change is noted involving the facet joints of L5-S1.  The SI joints appear normal.   IMPRESSION: Normal alignment with normal disc spaces.  No acute abnormality.  Impression & Recommendations:  Problem # 1:  BACK PAIN (ICD-724.5) Assessment Unchanged Low back pain  ? weight related.  L/S spine series normal plan: per PCP  Orders: Est. Patient Level IV ([address]) Radiology Referral (Radiology)  Problem # 2:  SARCOIDOSIS (ICD-135) Assessment: Unchanged Sarcoidosis with improvement in symptoms with remecade plan cont remecade/steroids  per rheum  Problem # 3:  GERD (ICD-530.81) Assessment: Unchanged GERD likely a ppt factor with cyclic cough plan cont GERD program Her updated medication list for this problem includes:    Zegerid 40-1100 Mg Caps (Omeprazole-sodium bicarbonate) ..... One twice daily  Orders: Est. Patient Level IV ([address]8119)  Medications Added to Medication List  This Visit: 1)  Zegerid 40-1100 Mg Caps (Omeprazole-sodium bicarbonate) .... One twice daily  Complete Medication List: 1)  Proair Hfa 108 (90 Base) Mcg/act Aers (Albuterol sulfate) .... 2 puffs three times a day as needed 2)  Atenolol-chlorthalidone 50-25 Mg Tabs (Atenolol-chlorthalidone) .... Take 1 tablet by mouth once a day 3)  Budeprion Xl 300 Mg Tb24 (Bupropion hcl) .... Take 1 tablet by mouth once a day 4)  Effexor Xr 75 Mg Cp24 (Venlafaxine hcl) .... Take 1 capsule by mouth two times a day 5)  Lorazepam 1 Mg Tabs (Lorazepam) .... Prn 6)  Novolog Flexpen 100 Unit/ml Soln (Insulin aspart) .... Three times a day as directed 7)  Onetouch Ultra Test Strp (Glucose blood) .... Tid 8)  Potassium Chloride Crys Cr 20 Meq Tbcr (Potassium chloride crys cr) .... Take 1 tablet by mouth two times a day 9)  Prednisone 5 Mg Tabs (Prednisone) .... Take 1 tablet by mouth once a day 10)  Zegerid 40-1100 Mg Caps (Omeprazole-sodium bicarbonate) .... One twice daily 11)  Crestor 20 Mg Tabs (Rosuvastatin calcium) .[name].. 1 by mouth once daily 12)  Qvar 80 Mcg/act Aers (Beclomethasone dipropionate) .... Two puffs twice daily 13)  Pen Needles 31g X 8 Mm Misc (Insulin pen needle) .... Use as directed 14)  Remicade 100 Mg Solr (Infliximab) .... Injection every 6 weeks 15)  Fluticasone Propionate 50 Mcg/act Susp (Fluticasone propionate) .... 2 sprays each nostril once daily 16)  Furosemide 20 Mg Tabs (Furosemide) .... Take one (1) by mouth daily 17)  Vicodin 5-500 Mg Tabs (Hydrocodone-acetaminophen) .... One tab every 6 hours as needed 18)  Vitamin D 1000 Unit Caps (Cholecalciferol) .... Two times a day 19)  Benicar 40 Mg Tabs (Olmesartan medoxomil) .[name].. 1 by mouth once daily 20)  Estrace 1 Mg Tabs (Estradiol) .... Take 1 tablet by mouth once a day 21)  Slow Release Iron 47.5 Mg Cr-tabs (Ferrous sulfate) .... Once daily 22)  Multiple Vitamin Tabs (Multiple vitamin) .... Once daily 23)  Tussionex Pennkinetic Er  8-10 Mg/73ml Lqcr (Chlorpheniramine-hydrocodone) .... Take 1 teaspoon every 12 hrs as needed cough 24)  Restasis 0.05 % Emul (Cyclosporine) .[name].. 1 drop each eye daily 25)  Fish Oil 500 Mg Caps (Omega-3 fatty acids) .... Once daily 26)  Lotemax 0.5 % Susp (Loteprednol etabonate) .[name].. 1 drop each eye every 6 hours as needed 27)  Utac 500-500 Mg Tabs (Methenamine mand-sod phosphate) .... Two times a day 28)  Diclofenac Sodium 50 Mg Tbec (Diclofenac sodium) .[name].. 1 by mouth 2 times daily. take with food  Patient Instructions: 1)  Increase  zegerid to one twice daily 2)  No other medication changes 3)  Lumbosacral spine series will be obtained 4)  Return two months Prescriptions: ZEGERID 40-1100 MG CAPS (OMEPRAZOLE-SODIUM BICARBONATE) One twice daily Brand medically necessary #60 x 6   Entered and Authorized by:   Storm Frisk MD   Signed by:   Storm Frisk MD on 04/08/2009   Method used:   Electronically to        [facility] (retail)       [address]       [place], [place]  [address]       Ph: [phone]       Fax: [phone]   RxID:   [id number]

## 2010-11-12 NOTE — Op Note (Signed)
Summary: Operative Report                    [place]  Patient:    [name], [name]                     MRN: [id number] Proc. Date: 03/03/00 Adm. Date:  [id number] Disc. Date: [id number] Attending:  Brandy Hale CC:         Valetta Mole. Swords, M.D. [facility]             John N. Eda Keys., M.D. [facility]                           Operative Report  PREOPERATIVE DIAGNOSIS:  Ventral incisional hernia.  POSTOPERATIVE DIAGNOSIS:  Ventral incisional hernia.  OPERATION PERFORMED:  Laparoscopic repair of ventral hernia with Dual mesh.  SURGEON:  [name]. [name], M.D.  ASSISTANT:  Velora Heckler, M.D.  ANESTHESIA:  INDICATIONS FOR PROCEDURE:  The patient is a 50 year old white female who is morbidly obese.  She has had an open cholecystectomy in the past.  She has had an open Nissen fundoplication in the past.  She presented in April of this year with abdominal cramps and pain above her umbilicus and was found to have a slightly tender reducible incisional hernia about three inches above the umbilicus.  There was no hernia or weakness in her right subcostal incision. She has had extensive GI work-up which is otherwise negative.  She is brought to the operating room electively for repair of her symptomatic ventral hernia.  OPERATIVE FINDINGS:  The patient had a hernia defect above her umbilicus which was about three to four inches in diameter with a hernia sac which was much larger.  Up along the midline all the way to the subxiphoid area there were areas of weakness which were noted upon visualization.  She had moderate adhesions in the upper midline which were soft and chronic and were taken down sharply and bluntly.  Her small intestine and large intestine were otherwise grossly normal to inspection.  DESCRIPTION OF PROCEDURE:  Following induction of general endotracheal anesthesia, the patients abdomen was prepped and draped in sterile fashion. A Foley catheter  had been previously inserted.  0.25% Marcaine with epinephrine was used as a local infiltration anesthetic.  A short transverse incision was made in the left flank anterior axillary line midway between the costal margin and the anterior superior iliac spine.  Dissection was carried down to the external oblique muscle which was incised and we went through the muscle layers under direct vision until we could elevate the peritoneum and make a small nick in the peritoneum and enter the peritoneal cavity under direct vision.  A 10 mm Hasson trocar was inserted into the peritoneal cavity and secured with a pursestring suture of 0 Vicryl on the fascia. Pneumoperitoneum was created.  Video camera was inserted with visualization and with the findings as described above.  We placed two 5 mm trocars on the left side, one in the left lower quadrant and one in the left upper quadrant and ultimately a 5 mm trocar in the right midabdomen.  Photographs were taken of the hernia defect.  We had to clear a fair amount of adhesions from the upper midline away from the falciform ligament to prepare for placement of the mesh.  After we had all the adhesions taken down, we placed spinal  needles through the abdominal wall to outline the margins of all the hernia defects. We then measured 3 to 4 cm peripheral to this and marked on the abdominal wall an elliptical shaped area for the intended mesh.  The size of the mesh turned out to be 15 cm transversely by 25 cm vertically.  We brought a large sheet of Dual mesh to the operative field and cut the mesh in the exact shape that had been marked on the abdominal wall as a template.  We marked the mesh in the 12 oclock, 3 oclock, 6 oclock and 9 oclock positions.  We placed sutures of 0 Novofil in the mesh at the 12, 3, 6 and 9 oclock positions and then rolled the mesh up and inserted it into the abdominal cavity.  The mesh was spread out and oriented properly.  We made  small incisions about 2 to 3 cm peripheral to the marked ring on the abdominal wall and in all four locations we passed the endoclose device and brought out both ends of the Novofil suture.  Once we had all of these sutures passed through the abdominal wall, we pulled the mesh up and found that it covered the defect quite nicely and there was no redundancy to the mesh.  We tied all four sutures which fixed the dual mesh to the abdominal wall.  Great care was taken to make sure that this smooth side of the mesh was toward the intestinal contents and the rough side of the mesh was toward the abdominal wall.  We then further secured the mesh to the adbominal wall with the 5 mm tacking device.  Two concentric rings of tacks were placed, one just at the edge of the mesh and then another concentric ring about 3 cm internally and we fired about 45 to 50 tacks to thoroughly secure the mesh to the abdominal wall.  After this was done, photographs were taken. We inspected all the areas of dissection and everything was hemostatic and there was no evidence of any intestinal injury.  The trocars were removed under direct vision and there was no bleeding from trocar sites.  The pneumoperitoneum was released.  The fascia down in the left flank trocar site was closed with 0 Vicryl suture.  The skin incisions were closed with subcuticular sutures of 4-0 Vicryl and Steri-Strips.  Clean bandages were placed and the patient taken to the recovery room in stable condition. Estimated blood loss was about 15 to 20 cc.  Complications were none.  Sponge, needle and instrument counts were correct. DD:  03/03/00 TD:  03/07/00 Job: [id number]

## 2010-11-12 NOTE — Assessment & Plan Note (Signed)
Summary: leg pain/dm   Vital Signs:  Patient Profile:   50 Years Old Female Height:     66 inches Pulse rate:   72 / minute Resp:     20 per minute  Vitals Entered By: Gladis Riffle, RN (July 17, 2008 9:17 AM)                 Referred by:  Chase Picket PCP:  Dr. Birdie Sons  Chief Complaint:  c/o left leg pain x 2 weeks and Lower Extremity Joint pain.  History of Present Illness:  Lower Extremity Joint Pain      [name] who presents with Lower Extremity Joint pain.  The patient denies swelling, redness, giving away, locking, popping, stiffness for >1 hr, decreased ROM, and weakness.  The pain is located in the left hip and left knee.  The pain began gradually and with no injury.  The pain is described as dull and aching.  Evaluation to date has included no evaluation.  The patient denies the following symptoms: fever, rash, eye symptoms, diarrhea, and dysuria.    Past Medical History: EDEMA (ICD-782.3) HEADACHE (ICD-784.0) MENOPAUSAL SYNDROME (ICD-627.2) UTI (ICD-599.0) SARCOIDOSIS (ICD-135) PULMONARY NODULE (ICD-518.89) HYPERTENSION (ICD-401.9) HYPERLIPIDEMIA (ICD-272.4) GERD (ICD-530.81) DIABETES MELLITUS, TYPE II (ICD-250.00) ANEMIA-NOS (ICD-285.9)    Past Surgical History: Ventral Hernia Repair Nissen Fundoplication Cholecystectomy Hysterectomy  Social History: Married Never Smoked Regular exercise-no Alcohol use-no  Family History: father-alive MI, DM mother alive and well uncle with SARCOID no other complaints in a complete ROS     Updated Prior Medication List: PROAIR HFA 108 (90 BASE) MCG/ACT  AERS (ALBUTEROL SULFATE) 2 puffs three times a day as needed ATENOLOL-CHLORTHALIDONE 50-25 MG TABS (ATENOLOL-CHLORTHALIDONE) 1/1/2 daily BUDEPRION XL 300 MG TB24 (BUPROPION HCL) Take 1 tablet by mouth once a day EFFEXOR XR 75 MG CP24 (VENLAFAXINE HCL) Take 1 capsule by mouth once a day LORAZEPAM 1 MG TABS (LORAZEPAM) prn NOVOLOG FLEXPEN  100 UNIT/ML SOLN (INSULIN ASPART) three times a day as directed ONETOUCH ULTRA TEST  STRP (GLUCOSE BLOOD) tid POTASSIUM CHLORIDE CRYS CR 20 MEQ TBCR (POTASSIUM CHLORIDE CRYS CR) Take 1 tablet by mouth two times a day PREDNISONE 10 MG  TABS (PREDNISONE) Take 1 tablet by mouth once a day ZEGERID 40-1100 MG CAPS (OMEPRAZOLE-SODIUM BICARBONATE) 2 by mouth daily CRESTOR 20 MG TABS (ROSUVASTATIN CALCIUM) 1 by mouth once daily QVAR 80 MCG/ACT  AERS (BECLOMETHASONE DIPROPIONATE) two puffs twice daily PEN NEEDLES 31G X 8 MM  MISC (INSULIN PEN NEEDLE) USE AS DIRECTED REMICADE 100 MG  SOLR (INFLIXIMAB) Injection every 6 weeks FLUTICASONE PROPIONATE 50 MCG/ACT  SUSP (FLUTICASONE PROPIONATE) 2 sprays each nostril once daily FUROSEMIDE 20 MG TABS (FUROSEMIDE) Take one (1) by mouth daily VICODIN 5-500 MG  TABS (HYDROCODONE-ACETAMINOPHEN) One tab every 6 hours as needed VITAMIN D 1000 UNIT  CAPS (CHOLECALCIFEROL) two times a day BENICAR 40 MG  TABS (OLMESARTAN MEDOXOMIL) 1 by mouth once daily PREMARIN 0.625 MG TABS (ESTROGENS CONJUGATED) take one tab by mouth once daily SLOW RELEASE IRON 47.5 MG CR-TABS (FERROUS SULFATE) once daily MULTIPLE VITAMIN  TABS (MULTIPLE VITAMIN) once daily TUSSIONEX PENNKINETIC ER 8-10 MG/5ML LQCR (CHLORPHENIRAMINE-HYDROCODONE) as needed  Current Allergies (reviewed today): ! METHOTREXATE      Physical Exam  General:     Well-developed,well-nourished,in no acute distress; alert,appropriate and cooperative throughout examination Head:     normocephalic and atraumatic.   Eyes:     pupils equal and pupils round.  Neck:     No deformities, masses, or tenderness noted. Chest Wall:     No deformities, masses, or tenderness noted. Abdomen:     Bowel sounds positive,abdomen soft and non-tender without masses, organomegaly or hernias noted.  obese Msk:     No deformity or scoliosis noted of thoracic or lumbar spine.    pain with flexion, extension, internal and  external rotation of left hip pain with full flexion of left knee Pulses:     R radial normal and L radial normal.      Impression & Recommendations:  Problem # 1:  LEG PAIN, LEFT (ICD-729.5) unclear etiology could be local mechanical problem related to kinee and hip could be radiculopthy trial mobic (note risks---discussed with patient) Orders: T-Knee Comp Left 4 Views (73564TC) T-Hip Comp Left Min 2-views (73510TC)   Complete Medication List: 1)  Proair Hfa 108 (90 Base) Mcg/act Aers (Albuterol sulfate) .... 2 puffs three times a day as needed 2)  Atenolol-chlorthalidone 50-25 Mg Tabs (Atenolol-chlorthalidone) .... 1/1/2 daily 3)  Budeprion Xl 300 Mg Tb24 (Bupropion hcl) .... Take 1 tablet by mouth once a day 4)  Effexor Xr 75 Mg Cp24 (Venlafaxine hcl) .... Take 1 capsule by mouth once a day 5)  Lorazepam 1 Mg Tabs (Lorazepam) .... Prn 6)  Novolog Flexpen 100 Unit/ml Soln (Insulin aspart) .... Three times a day as directed 7)  Onetouch Ultra Test Strp (Glucose blood) .... Tid 8)  Potassium Chloride Crys Cr 20 Meq Tbcr (Potassium chloride crys cr) .... Take 1 tablet by mouth two times a day 9)  Prednisone 10 Mg Tabs (Prednisone) .... Take 1 tablet by mouth once a day 10)  Zegerid 40-1100 Mg Caps (Omeprazole-sodium bicarbonate) .... 2 by mouth daily 11)  Crestor 20 Mg Tabs (Rosuvastatin calcium) .[name].. 1 by mouth once daily 12)  Qvar 80 Mcg/act Aers (Beclomethasone dipropionate) .... Two puffs twice daily 13)  Pen Needles 31g X 8 Mm Misc (Insulin pen needle) .... Use as directed 14)  Remicade 100 Mg Solr (Infliximab) .... Injection every 6 weeks 15)  Fluticasone Propionate 50 Mcg/act Susp (Fluticasone propionate) .... 2 sprays each nostril once daily 16)  Furosemide 20 Mg Tabs (Furosemide) .... Take one (1) by mouth daily 17)  Vicodin 5-500 Mg Tabs (Hydrocodone-acetaminophen) .... One tab every 6 hours as needed 18)  Vitamin D 1000 Unit Caps (Cholecalciferol) .... Two times a  day 19)  Benicar 40 Mg Tabs (Olmesartan medoxomil) .[name].. 1 by mouth once daily 20)  Premarin 0.625 Mg Tabs (Estrogens conjugated) .... Take one tab by mouth once daily 21)  Slow Release Iron 47.5 Mg Cr-tabs (Ferrous sulfate) .... Once daily 22)  Multiple Vitamin Tabs (Multiple vitamin) .... Once daily 23)  Tussionex Pennkinetic Er 8-10 Mg/94ml Lqcr (Chlorpheniramine-hydrocodone) .... As needed 24)  Meloxicam 7.5 Mg Tabs (Meloxicam) .... One by mouth daily    Prescriptions: MELOXICAM 7.5 MG  TABS (MELOXICAM) one by mouth daily  #10 x 0   Entered and Authorized by:   Birdie Sons MD   Signed by:   Birdie Sons MD on 07/17/2008   Method used:   Electronically to        [facility] (retail)       [address]       [place], [place]  [address]       Ph: [phone]       Fax: [phone]   RxID:   [id number]  ]

## 2010-11-12 NOTE — Assessment & Plan Note (Signed)
Summary: back pain/njr   Vital Signs:  Patient profile:   50 year old female Temp:     98.5 degrees F Pulse rate:   76 / minute Resp:     16 per minute BP sitting:   128 / 70  (left arm) Cuff size:   large  Vitals Entered By: Gladis Riffle, RN (March 26, 2009 11:27 AM)  Primary Care Mohan Erven:  Dr. Birdie Sons   History of Present Illness: multiple complaints Back Pain--- few weeks of sxs bilateral low back pain. no fever or dsuria---see previous note and ua results. She has long hx of intermittent back pain. Pain started gradually. she now rates 6/10 pain. no associated neurolgoic sxs, no radiation.  Cough---long hx---has sarcoid. She says cough worse over past 5 days. non productive, no fever or chills.  Sarcoid---low dose prednison---q 6 week remicade  All other systems reviewed and were negative   Past Medical History: EDEMA (ICD-782.3) HEADACHE (ICD-784.0) MENOPAUSAL SYNDROME (ICD-627.2) UTI (ICD-599.0) SARCOIDOSIS (ICD-135)   -CT Chest 6/08 improvement in Mediastinal LAN   -11/08 start of remecade Rx per [facility] PULMONARY NODULE (ICD-518.89)    -CT Chest 6/08 reduction in size of nodules due to Arava and Pred RX     -probable granulomas due to Sarcoidosis HYPERTENSION (ICD-401.9) HYPERLIPIDEMIA (ICD-272.4) GERD (ICD-530.81) DIABETES MELLITUS, TYPE II (ICD-250.00) ANEMIA-NOS (ICD-285.9)    Past Surgical History: Ventral Hernia Repair Nissen Fundoplication Cholecystectomy Hysterectomy  Social History: Married Never Smoked Regular exercise-no Alcohol use-no  Family History: father-alive MI, DM mother alive and well uncle with SARCOID All other systems reviewed and were negative   Allergies: 1)  ! Methotrexate  Comments:  Nurse/Medical Assistant: c/o right flank pain x 2 weeks--also states mucinex not helping with head congestion, having headaches The patient's medications and allergies were reviewed with the patient and were updated in  the Medication and Allergy Lists. Gladis Riffle, RN (March 26, 2009 11:28 AM)  Physical Exam  General:  nad, obese Head:  normocephalic and atraumatic Eyes:  pupils equal and pupils round.   Ears:  R ear normal and L ear normal.   Nose:  no external deformity and no external erythema.   Neck:  no masses, thyromegaly, or abnormal cervical nodes Chest Wall:  No deformities, masses, or tenderness noted. Lungs:  normal respiratory effort and no intercostal retractions.   Abdomen:  soft and non-tender.  obese Msk:  No deformity or scoliosis noted of thoracic or lumbar spine.   Neurologic:  cranial nerves II-XII intact and gait normal.   DTRs at knee and ankle -2/-2 bilaterally Skin:  turgor normal and color normal.   Psych:  normally interactive and good eye contact.     Impression & Recommendations:  Problem # 1:  BACK PAIN (ICD-724.5) reviewed recent urine culture Her updated medication list for this problem includes:    Vicodin 5-500 Mg Tabs (Hydrocodone-acetaminophen) ..... One tab every 6 hours as needed    Diclofenac Sodium 50 Mg Tbec (Diclofenac sodium) .[name]... 1 by mouth 2 times daily. take with food  Problem # 2:  URI (ICD-465.9) no evidence of bacterial infection. call for any concerns, increased sxs, fever, persistence of sxs, wheeze, SOB.  trial maxichlor dm two times a day  call if sxs persist Her updated medication list for this problem includes:    Tussionex Pennkinetic Er 8-10 Mg/33ml Lqcr (Chlorpheniramine-hydrocodone) .[name]... Take 1 teaspoon every 12 hrs as needed cough    Diclofenac Sodium 50 Mg Tbec (Diclofenac  sodium) .[name]... 1 by mouth 2 times daily. take with food  Complete Medication List: 1)  Proair Hfa 108 (90 Base) Mcg/act Aers (Albuterol sulfate) .... 2 puffs three times a day as needed 2)  Atenolol-chlorthalidone 50-25 Mg Tabs (Atenolol-chlorthalidone) .... Take 1 tablet by mouth once a day 3)  Budeprion Xl 300 Mg Tb24 (Bupropion hcl) .... Take 1 tablet by mouth  once a day 4)  Effexor Xr 75 Mg Cp24 (Venlafaxine hcl) .... Take 1 capsule by mouth two times a day 5)  Lorazepam 1 Mg Tabs (Lorazepam) .... Prn 6)  Novolog Flexpen 100 Unit/ml Soln (Insulin aspart) .... Three times a day as directed 7)  Onetouch Ultra Test Strp (Glucose blood) .... Tid 8)  Potassium Chloride Crys Cr 20 Meq Tbcr (Potassium chloride crys cr) .... Take 1 tablet by mouth two times a day 9)  Prednisone 5 Mg Tabs (Prednisone) .... Take 1 tablet by mouth once a day 10)  Zegerid 40-1100 Mg Caps (Omeprazole-sodium bicarbonate) .... 2 by mouth daily 11)  Crestor 20 Mg Tabs (Rosuvastatin calcium) .[name].. 1 by mouth once daily 12)  Qvar 80 Mcg/act Aers (Beclomethasone dipropionate) .... Two puffs twice daily 13)  Pen Needles 31g X 8 Mm Misc (Insulin pen needle) .... Use as directed 14)  Remicade 100 Mg Solr (Infliximab) .... Injection every 6 weeks 15)  Fluticasone Propionate 50 Mcg/act Susp (Fluticasone propionate) .... 2 sprays each nostril once daily 16)  Furosemide 20 Mg Tabs (Furosemide) .... Take one (1) by mouth daily 17)  Vicodin 5-500 Mg Tabs (Hydrocodone-acetaminophen) .... One tab every 6 hours as needed 18)  Vitamin D 1000 Unit Caps (Cholecalciferol) .... Two times a day 19)  Benicar 40 Mg Tabs (Olmesartan medoxomil) .[name].. 1 by mouth once daily 20)  Estrace 1 Mg Tabs (Estradiol) .... Take 1 tablet by mouth once a day 21)  Slow Release Iron 47.5 Mg Cr-tabs (Ferrous sulfate) .... Once daily 22)  Multiple Vitamin Tabs (Multiple vitamin) .... Once daily 23)  Tussionex Pennkinetic Er 8-10 Mg/80ml Lqcr (Chlorpheniramine-hydrocodone) .... Take 1 teaspoon every 12 hrs as needed cough 24)  Restasis 0.05 % Emul (Cyclosporine) .[name].. 1 drop each eye daily 25)  Fish Oil 500 Mg Caps (Omega-3 fatty acids) .... Once daily 26)  Lotemax 0.5 % Susp (Loteprednol etabonate) .[name].. 1 drop each eye every 6 hours as needed 27)  Utac 500-500 Mg Tabs (Methenamine mand-sod phosphate) .... Two times a day 28)   Diclofenac Sodium 50 Mg Tbec (Diclofenac sodium) .[name].. 1 by mouth 2 times daily. take with food Prescriptions: DICLOFENAC SODIUM 50 MG TBEC (DICLOFENAC SODIUM) 1 by mouth 2 times daily. take with food  #30 x 0   Entered by:   Lynann Beaver CMA   Authorized by:   Birdie Sons MD   Signed by:   Lynann Beaver CMA on 03/27/2009   Method used:   Electronically to        [name] ([facility])       [address]       [place], [place]  [address]       Ph: [phone]       Fax: [phone]   RxID:   [id number]

## 2010-11-12 NOTE — Progress Notes (Signed)
Summary: Dexilant PA approved 10/29/2010-07/24/2013  Phone Note Outgoing Call   Call placed by: Michel Bickers CMA,  October 20, 2010 10:23 AM Summary of Call: PA initiated for Dexilant 60mg .  Patient ID# is [id number].  Dr. Gershon Mussel # is [phone].  Awaitng faxed form.  Form received and given to Crystal. Initial call taken by: Michel Bickers CMA,  October 20, 2010 11:27 AM  Follow-up for Phone Call        Form completed by Dr. Delford Field and faxed back to [facility] at [phone].  Forms plcaed in traige with the awaiting approval/denial forms. Gweneth Dimitri RN  October 28, 2010 4:31 PM    [facility] [address] and pt informed. Form given to Bobbye Morton to scan into pt's chart. Zackery Barefoot CMA  October 29, 2010 10:08 AM

## 2010-11-12 NOTE — Progress Notes (Signed)
Summary: lab results  Phone Note Call from Patient   Summary of Call: [phone] Requesting lab results. Initial call taken by: Lynann Beaver CMA,  August 05, 2008 1:08 PM  Follow-up for Phone Call        see append to labs   Pt given results. Lynann Beaver [facility]  August 05, 2008 4:32 PM Follow-up by: Birdie Sons MD,  August 05, 2008 4:20 PM

## 2010-11-12 NOTE — Letter (Signed)
Summary: [facility]   Imported By: Lennie Odor 10/13/2010 14:58:39  _____________________________________________________________________  External Attachment:    Type:   Image     Comment:   External Document

## 2010-11-12 NOTE — Assessment & Plan Note (Signed)
Summary: 6 WK FOLLOW-UP // RS--- PT RSC (BMP) // RS   Vital Signs:  Patient profile:   50 year old female Weight:      238 pounds BMI:     38.55 Temp:     97.9 degrees F oral Pulse rate:   80 / minute Pulse rhythm:   regular Resp:     24 per minute BP sitting:   120 / 72  (left arm) Cuff size:   large  Vitals Entered By: Gladis Riffle, RN (March 11, 2010 12:21 PM) CC: 6 week rov, omnaris working well, needs more--states CBGs "good" at home Is Patient Diabetic? Yes Did you bring your meter with you today? No   Primary Care Provider:  Birdie Sons, MD   CC:  6 week rov, omnaris working well, and needs more--states CBGs "good" at home.  History of Present Illness:  Follow-Up Visit      [name] who presents for Follow-up visit.  The patient denies chest pain and palpitations.  Since the last visit the patient notes no new problems or concerns.  The patient reports taking meds as prescribed.  When questioned about possible medication side effects, the patient notes none.  nasal congestion much better on omnaris she is feeling well and continues to lose weight.  All other systems reviewed and were negative   Preventive Screening-Counseling & Management  Alcohol-Tobacco     Smoking Status: never  Current Medications (verified): 1)  Atenolol-Chlorthalidone 50-25 Mg Tabs (Atenolol-Chlorthalidone) .... Take 1 Tablet By Mouth Once A Day 2)  Effexor Xr 75 Mg Cp24 (Venlafaxine Hcl) .... Take 1 Tablet By Mouth Once A Day 3)  Lorazepam 1 Mg Tabs (Lorazepam) .... One Every 6 Hrs As Needed 4)  Potassium Chloride Crys Cr 20 Meq Tbcr (Potassium Chloride Crys Cr) .[name].. 1 Three Times A Day 5)  Zegerid 40-1100 Mg Caps (Omeprazole-Sodium Bicarbonate) .... Take 1 Tablet By Mouth Once A Day 6)  Remicade 100 Mg  Solr (Infliximab) .... Injection Every 6 Weeks 7)  Omnaris 50 Mcg/act Susp (Ciclesonide) .... One Spray Each Nostril Once Daily 8)  Furosemide 20 Mg Tabs (Furosemide) ....  Take One (1) By Mouth Daily 9)  Vicodin 5-500 Mg  Tabs (Hydrocodone-Acetaminophen) .... One Tab Every 6 Hours As Needed 10)  Estrace 1 Mg Tabs (Estradiol) .... Take 1 Tablet By Mouth Once A Day 11)  Restasis 0.05 % Emul (Cyclosporine) .[name].. 1 Drop Each Eye Daily 12)  Lotemax 0.5 % Susp (Loteprednol Etabonate) .[name].. 1 Drop Each Eye Every 6 Hours As Needed 13)  Simvastatin 40 Mg Tabs (Simvastatin) .... Take One Tablet At Bedtime Start One Week After Completing Diflucan 14)  Levbid 0.375 Mg Xr12h-Tab (Hyoscyamine Sulfate) .[name].. 1 By Mouth Two Times A Day As Needed 15)  Prednisone 5 Mg Tabs (Prednisone) .... Take 1 Tablet By Mouth Every Other Day 16)  Cpap 7 Cm 17)  Nystatin-Triamcinolone 100000-0.1 Unit/gm-% Crea (Nystatin-Triamcinolone) .... Apply Two Times A Day To Affected Area 18)  Hydromet 5-1.5 Mg/76ml Syrp (Hydrocodone-Homatropine) .[name].. 1 Tsp Three Times A Day As Needed Cough 19)  Valtrex 1 Gm Tabs (Valacyclovir Hcl) .... Take 1 Tablet By Mouth Three Times A Day  Allergies: 1)  ! Methotrexate  Physical Exam  General:  Well-developed,well-nourished,in no acute distress; alert,appropriate and cooperative throughout examination Head:  normocephalic and atraumatic.   Eyes:  pupils equal and pupils round.   Ears:  R ear normal and L ear normal.  Neck:  supple.   Chest Wall:  marked tenderness costochondral joints 2-7 Lungs:  Normal respiratory effort, chest expands symmetrically. Lungs are clear to auscultation, no crackles or wheezes. Heart:  normal rate and regular rhythm.   Abdomen:  soft and non-tender.   Skin:  turgor normal and color normal.     Impression & Recommendations:  Problem # 1:  URTICARIA (ICD-708.9) resolved  Problem # 2:  DIABETES MELLITUS, TYPE II (ICD-250.00) she is off all meds--- essentially cured due to weight loss.  Labs Reviewed: Creat: 0.7 (12/02/2009)     Last Eye Exam: normalpt's report (10/11/2009) Reviewed HgBA1c results: 6.6 (12/02/2009)  7.1  (09/02/2009)  Problem # 3:  HYPERLIPIDEMIA (ICD-272.4) continue current medications  may be able to get off meds Her updated medication list for this problem includes:    Simvastatin 40 Mg Tabs (Simvastatin) .[name]... Take one tablet at bedtime start one week after completing diflucan  Labs Reviewed: SGOT: 19 (12/02/2009)   SGPT: 16 (12/02/2009)  Prior 10 Yr Risk Heart Disease: 8 % (09/21/2006)   HDL:44.10 (12/02/2009), 41.10 (09/02/2009)  LDL:DEL (09/30/2008), DEL (06/05/2008)  Chol:171 (12/02/2009), 275 (09/02/2009)  Trig:314.0 (12/02/2009), 456.0 (09/02/2009)  Problem # 4:  HYPERTENSION (ICD-401.9) stay on same meds for now except will stop furosemide (i suspect previous fluid retention related to weight) The following medications were removed from the medication list:    Furosemide 20 Mg Tabs (Furosemide) .[name]... Take one (1) by mouth daily Her updated medication list for this problem includes:    Atenolol-chlorthalidone 50-25 Mg Tabs (Atenolol-chlorthalidone) .[name]... Take 1 tablet by mouth once a day  BP today: 120/72 Prior BP: 120/80 (02/25/2010)  Prior 10 Yr Risk Heart Disease: 8 % (09/21/2006)  Labs Reviewed: K+: 3.4 (12/02/2009) Creat: : 0.7 (12/02/2009)   Chol: 171 (12/02/2009)   HDL: 44.10 (12/02/2009)   LDL: DEL (09/30/2008)   TG: 314.0 (12/02/2009)  Complete Medication List: 1)  Atenolol-chlorthalidone 50-25 Mg Tabs (Atenolol-chlorthalidone) .... Take 1 tablet by mouth once a day 2)  Effexor Xr 75 Mg Cp24 (Venlafaxine hcl) .... Take 1 tablet by mouth once a day 3)  Lorazepam 1 Mg Tabs (Lorazepam) .... One every 6 hrs as needed 4)  Zegerid 40-1100 Mg Caps (Omeprazole-sodium bicarbonate) .... Take 1 tablet by mouth once a day 5)  Remicade 100 Mg Solr (Infliximab) .... Injection every 6 weeks 6)  Omnaris 50 Mcg/act Susp (Ciclesonide) .... One spray each nostril once daily 7)  Vicodin 5-500 Mg Tabs (Hydrocodone-acetaminophen) .... One tab every 6 hours as needed 8)  Estrace 1  Mg Tabs (Estradiol) .... Take 1 tablet by mouth once a day 9)  Restasis 0.05 % Emul (Cyclosporine) .[name].. 1 drop each eye daily 10)  Lotemax 0.5 % Susp (Loteprednol etabonate) .[name].. 1 drop each eye every 6 hours as needed 11)  Simvastatin 40 Mg Tabs (Simvastatin) .... Take one tablet at bedtime start one week after completing diflucan 12)  Levbid 0.375 Mg Xr12h-tab (Hyoscyamine sulfate) .[name].. 1 by mouth two times a day as needed 13)  Prednisone 5 Mg Tabs (Prednisone) .... Take 1 tablet by mouth every other day 14)  Cpap 7 Cm  15)  Nystatin-triamcinolone 100000-0.1 Unit/gm-% Crea (Nystatin-triamcinolone) .... Apply two times a day to affected area 16)  Hydromet 5-1.5 Mg/72ml Syrp (Hydrocodone-homatropine) .[name].. 1 tsp three times a day as needed cough 17)  Valtrex 1 Gm Tabs (Valacyclovir hcl) .... Take 1 tablet by mouth three times a day  Patient Instructions: 1)  see me 6  weeks 2)  labs one week prior to visit 3)  lipids---272.4 4)  lfts-995.2 5)  bmet-995.2 6)  A1C-250.02 7)     Prescriptions: OMNARIS 50 MCG/ACT SUSP (CICLESONIDE) one spray each nostril once daily  #1 x PRN   Entered and Authorized by:   Birdie Sons MD   Signed by:   Birdie Sons MD on 03/11/2010   Method used:   Electronically to        [facility] (retail)       [address]       [place], [place]  [address]       Ph: [phone]       Fax: [phone]   RxID:   [id number]

## 2010-11-12 NOTE — Letter (Signed)
Summary: [facility]  [facility]   Imported By: Maryln Gottron 12/04/2008 14:11:43  _____________________________________________________________________  External Attachment:    Type:   Image     Comment:   External Document

## 2010-11-12 NOTE — Op Note (Signed)
Summary: Operative Report                    [facility]  Patient:    [name], [name] Visit Number: [id number] MRN: [id number]          Service Type: DSU Location: [id number] Attending Physician:  Brandy Hale Dictated by:   [name]. [name], M.D. Proc. Date: 03/14/02 Admit Date:  03/14/2002   CC:         Bruce H. Swords, M.D. [facility]  Luisa Hart E. Delford Field, M.D. [facility]  John N. Eda Keys., M.D. [facility]   Operative Report  PREOPERATIVE DIAGNOSIS: Recurrent incarcerated ventral hernia, complex.  POSTOPERATIVE DIAGNOSIS: Recurrent incarcerated ventral hernia, complex.  OPERATION PERFORMED: Repair, recurrent incarcerated ventral hernia with 12 x 12 inch polypropylene mesh.  SURGEON: [name]. [name], M.D.  PREOPERATIVE INDICATIONS: The patient is a 50 year old white female who has had multiple abdominal operations. She has had a cholecystectomy. She has had a laparoscopic Nissen fundoplication. She has had a laparoscopic ventral hernia repair with Gore-Tex mesh. She has had a laparoscopic tubal. She has morbid obesity, hypertension, chronic cough and vocal cord dysfunction. She presented last month with a ventral hernia. On examination she has multiple scars noted. There is a hernia in the midline above the umbilicus which is moderately large and reducible clinically. She states that this has been getting larger and is uncomfortable and wants to have this repaired.  OPERATIVE FINDINGS: The patient had a complex ventral hernia. The Gore-Tex mesh was noted in the subfascial location and a large incarcerated hernia had protruded above the upper edge of this. There was preperitoneal fat and transverse colon incarcerated in this, necessitating extensive dissection. She had multiple other defects in the midline, which required about 1 hour and 15 minutes of tedious dissection to untangle the multiple incarcerated areas of preperitoneal fat and defining areas  of fascial herniation. The incision ultimately went about from the xiphoid all the way down to the umbilicus and with extensive undermining in all directions to define and reduce all of the abnormal areas. The transverse colon itself looked normal.  OPERATIVE TECHNIQUE: Following the induction of general endotracheal anesthesia the patients abdomen was prepped and draped in a sterile fashion. We ultimately excised the midline scar from just below the xiphoid all the way down to the supraumbilical area. Dissection was carried down through the subcutaneous tissue all the way down to the midline fascia.  We identified a large hernia about 6 cm in diameter or greater with incarcerated transverse colon. This was dissected away from the fascia. We entered the hernia sac, identified the GI tract and reduced all of this and dissected all of the adhesions away until everything was completely reduced. We undermined the subcutaneous tissue circumferentially until we had about 4 cm to 5 cm normal fascia in all directions. We found multiple other defects in the midline and had to debride the incarcerated fat out of these which was somewhat difficult because of the scar tissue. Ultimately we had a very large wound. We closed the major defect in the fascia with about 10 interrupted sutures of #1 Novofil. This closed that defect back in the midline.  We took a 12 inch x 12 inch piece of polypropylene mesh and cut this into an elliptical shape. This was sutured in place on top of the fascia with about 16 interrupted mattress sutures of 0 Prolene. The mesh covered the midline all the way  from the umbilicus to the xiphoid and was about 8 inches wide at its widest point, and was about 12 to 14 inches long in the sagittal orientation.  The wound was copiously irrigated with 2 liters of saline. Hemostasis was very good, achieved with electrocautery. The wound was then irrigated with antibiotic irrigating  solution. Two 19 French Blake drains were placed in the wound and brought out through separate stab incisions in the upper abdomen. These drains were sutured to the skin with nylon sutures and connected to suction bulbs.  I chose not to close the subcutaneous tissue because of concern that this would cause tissue necrosis and infection. I simply closed the skin with skin staples. A clean bandage was placed. The Alco abdominal band binder was placed. The patient was taken to the recovery room in stable condition. Estimated blood loss was about 250 cc. Complications were none. Sponge and instrument counts were correct.        Dictated by:   [name]. [name], M.D.  Attending Physician:  Brandy Hale DD:  03/14/02 TD:  03/15/02 Job: [id number]

## 2010-11-12 NOTE — Assessment & Plan Note (Signed)
Summary: abd. pain    History of Present Illness Visit Type: follow up  Primary GI MD: Yancey Flemings MD Primary Jafet Wissing: Birdie Sons, MD  Requesting Zayra Devito: n/a Chief Complaint: Generalized abd pain  History of Present Illness:   Patient is a 50 year-old white female with multiple medical problems including obesity, hypertension, hyperlipidemia, diabetes mellitus, pulmonary sarcoid, GERD status post fundoplication, chronic atypical chest pain, and recurrent problems with bronchospasm and respiratory failure. She is followed by Dr. Marina Goodell for GERD. Last EGD Aug. of this year was unremarkable (fundoplication intact, fundic gland polyps)  Dec. 1st went to ER for diffuse abdominal pain. CT Scan neg. Lipase, LFTs normal except albumin 3.4. CBC pertinent for WBC of 17.1 (on chronic steroids, Hgb 11.9, mcv 95. Renal function and electrolytes normal except K+ 2.9. U/A showed moderate Leuk, 3-6 WBCs rare bacteria, neg. nitrites. Released home, pain still present but not as often. Never had this pain before Tuesday, started abruptly, was non-radiating, constant. Since leaving ER pain comes and goes, not related to meals but often gets better with defecation. Also, stools more loose and frequent than normal.  Initially was having 4-6 loose BMs a day but now down to 2-3 a day. No fevers. No rectal  bleeding. Has some nausea and feels pain would abate if she could vomit.   On Remicade for sarcoid.     GI Review of Systems    Reports abdominal pain and  nausea.     Location of  Abdominal pain: Diffuse.    Denies acid reflux, belching, bloating, chest pain, dysphagia with liquids, dysphagia with solids, heartburn, loss of appetite, vomiting, vomiting blood, weight loss, and  weight gain.      Reports diarrhea.     Denies anal fissure, black tarry stools, change in bowel habit, constipation, diverticulosis, fecal incontinence, heme positive stool, hemorrhoids, irritable bowel syndrome, jaundice, light color  stool, liver problems, rectal bleeding, and  rectal pain.    Current Medications (verified): 1)  Atenolol-Chlorthalidone 50-25 Mg Tabs (Atenolol-Chlorthalidone) .... Take 1 Tablet By Mouth Once A Day 2)  Effexor Xr 75 Mg Cp24 (Venlafaxine Hcl) .... Take 1 Tablet By Mouth Once A Day 3)  Lorazepam 1 Mg Tabs (Lorazepam) .... One Every 6 Hrs As Needed 4)  Novolog Flexpen 100 Unit/ml Soln (Insulin Aspart) .... Three Times A Day As Directed 5)  Onetouch Ultra Test  Strp (Glucose Blood) .... Tid 6)  Potassium Chloride Crys Cr 20 Meq Tbcr (Potassium Chloride Crys Cr) .[name].. 1 Three Times A Day 7)  Prednisone 10 Mg Tabs (Prednisone) .... Take As Directed. 8)  Zegerid 40-1100 Mg Caps (Omeprazole-Sodium Bicarbonate) .... Take 1 Tablet By Mouth Once A Day 9)  Pen Needles 31g X 8 Mm  Misc (Insulin Pen Needle) .... Use As Directed 10)  Remicade 100 Mg  Solr (Infliximab) .... Injection Every 6 Weeks 11)  Fluticasone Propionate 50 Mcg/act  Susp (Fluticasone Propionate) .... 2 Sprays Each Nostril Once Daily 12)  Furosemide 20 Mg Tabs (Furosemide) .... Take One (1) By Mouth Daily 13)  Vicodin 5-500 Mg  Tabs (Hydrocodone-Acetaminophen) .... One Tab Every 6 Hours As Needed 14)  Estrace 1 Mg Tabs (Estradiol) .... Take 1 Tablet By Mouth Once A Day 15)  Restasis 0.05 % Emul (Cyclosporine) .[name].. 1 Drop Each Eye Daily 16)  Lotemax 0.5 % Susp (Loteprednol Etabonate) .[name].. 1 Drop Each Eye Every 6 Hours As Needed 17)  Utac 500-500 Mg Tabs (Methenamine Mand-Sod Phosphate) .... Two Times A Day  18)  Tussionex Pennkinetic Er 8-10 Mg/44ml Lqcr (Chlorpheniramine-Hydrocodone) .[name].. 1 Tsp Every 12 Hr As Needed For Cough, May Make You Sleepy 19)  Simvastatin 40 Mg Tabs (Simvastatin) .... Take One Tablet At Bedtime Start One Week After Completing Diflucan 20)  Magnesium Oxide 250 Mg Tabs (Magnesium Oxide) .... One Tablet By Mouth Once Daily  Allergies: 1)  ! Methotrexate  Past History:  Past Medical History: Reviewed history from  04/21/2009 and no changes required. EDEMA (ICD-782.3) HEADACHE (ICD-784.0) MENOPAUSAL SYNDROME (ICD-627.2) UTI (ICD-599.0) SARCOIDOSIS (ICD-135)   -CT Chest 6/08 improvement in Mediastinal LAN   -11/08 start of remecade Rx per [facility] PULMONARY NODULE (ICD-518.89)    -CT Chest 6/08 reduction in size of nodules due to Arava and Pred RX     -probable granulomas due to Sarcoidosis HYPERTENSION (ICD-401.9) HYPERLIPIDEMIA (ICD-272.4) GERD (ICD-530.81) Sleep APnea DIABETES MELLITUS, TYPE II (ICD-250.00) ANEMIA-NOS (ICD-285.9)    Past Surgical History: Reviewed history from 08/21/2006 and no changes required. Ventral Hernia Repair Nissen Fundoplication Cholecystectomy Hysterectomy  Family History: Reviewed history from 05/12/2009 and no changes required. father-alive MI, DM mother alive and well uncle with SARCOID No FH of Colon Cancer:  Social History: Reviewed history from 05/12/2009 and no changes required. Married Never Smoked Regular exercise-no Alcohol use-no Illicit Drug Use - no  Review of Systems  The patient denies allergy/sinus, anemia, anxiety-new, arthritis/joint pain, back pain, blood in urine, breast changes/lumps, change in vision, confusion, cough, coughing up blood, depression-new, fainting, fatigue, fever, headaches-new, hearing problems, heart murmur, heart rhythm changes, itching, menstrual pain, muscle pains/cramps, night sweats, nosebleeds, pregnancy symptoms, shortness of breath, skin rash, sleeping problems, sore throat, swelling of feet/legs, swollen lymph glands, thirst - excessive , urination - excessive , urination changes/pain, urine leakage, vision changes, and voice change.    Vital Signs:  Patient profile:   50 year old female Height:      66 inches Weight:      272 pounds BMI:     44.06 BSA:     2.28 Pulse rate:   66 / minute Pulse rhythm:   regular BP sitting:   124 / 72  (left arm) Cuff size:   large  Vitals Entered  By: Ok Anis CMA (September 18, 2009 2:05 PM)  Physical Exam  General:  Well developed, well nourished, no acute distress. Head:  Normocephalic and atraumatic. Eyes:  Conjunctiva pink, no icterus.  Neck:  no obvious masses  Lungs:  Clear throughout to auscultation. Heart:  Regular rate and rhythm; no murmurs, rubs,  or bruits. Abdomen:  Abdomen soft,  nondistended. Mild diffuse tenderness in all but RLQ.  No obvious masses or hepatomegaly.Normal bowel sounds.  Extremities:  No palmar erythema, no edema.  Neurologic:  Alert and  oriented x4;  grossly normal neurologically. Psych:  Alert and cooperative. Normal mood and affect.   Impression & Recommendations:  Problem # 1:  ABDOMINAL PAIN -GENERALIZED (ICD-789.07) Assessment New Eight day history of diffuse abdominal pain, nausea and diarrhea.  ER visit- Negative CTscan. No significant lab abnormalities (WBC high on chronic steroids). I don't see where stool studies were done. Pain more intermittent now, still having 2-3 loose BMs / day. Etiolgy not clear, possibly gastroenteritis. Patient chronically immunosuppressed so need to collect stool studies to look for infectious process given duration of symptoms. Will treat empirically with Flagyl. Hyoscyamine for pain. WBC seems a little high for such a low dose of Prednisone so will recheck today. Patient will follow up  with Dr. Marina Goodell.  Orders: TLB-CBC Platelet - w/Differential ([id number]-[id number]) T-Fecal WBC ([id number]) T-Culture, Stool ([id number])  Problem # 2:  SARCOIDOSIS (ICD-135) Assessment: Comment Only  Problem # 3:  DIABETES MELLITUS, TYPE II (ICD-250.00) Assessment: Comment Only  Problem # 4:  ANEMIA-UNSPECIFIED (ICD-285.9) Assessment: Comment Only Mild normocytic anemia, no overt GI blood loss. I don't believe patient has had a colonoscopy but this can be addressed at next visit with Dr. Marina Goodell.  Other Orders: T-Culture, C-Diff Toxin A/B ([id number])  Patient  Instructions: 1)  Please go to the lab, basement level. 2)  We prescribed and sent to your pharmacy Flagyl and Bentyl. 3)  We made a follow up appointment with Dr. Marina Goodell on 10-14-2009 at 9:45 Am. 4)  Copy sent to : Birdie Sons, Md 5)  The medication list was reviewed and reconciled.  All changed / newly prescribed medications were explained.  A complete medication list was provided to the patient / caregiver. Prescriptions: BENTYL 10 MG CAPS (DICYCLOMINE HCL) Take 1 tab twice daily  #60 x 0   Entered by:   Lowry Ram NCMA   Authorized by:   Willette Cluster NP   Signed by:   Lowry Ram NCMA on 09/18/2009   Method used:   Electronically to        [facility] (retail)       [address]       [place], [place]  [address]       Ph: [phone]       Fax: [phone]   RxID:   [id number] FLAGYL 500 MG TABS (METRONIDAZOLE) Take 1 tab 3 times daily x 10 days  #30 x 0   Entered by:   Lowry Ram NCMA   Authorized by:   Willette Cluster NP   Signed by:   Lowry Ram NCMA on 09/18/2009   Method used:   Electronically to        [facility] (retail)       [address]       [place], [place]  [address]       Ph: [phone]       Fax: [phone]   RxID:   [id number]  [id number]_[id number]  CTScan

## 2010-11-12 NOTE — Progress Notes (Signed)
Summary: chest pains  Phone Note Call from Patient Call back at [phone]   Caller: Patient Call For: wright Summary of Call: pt have chest pains is there something she can take [facility] Initial call taken by: Rickard Patience,  May 01, 2009 2:59 PM  Follow-up for Phone Call        Spoke with pt.  She states that she was d/c'ed from hospital yesterday and today is having CP upon inspiration.  OV with TP in [place] this afternoon at 4 pm.  Pt aware of were [place] office is located. Follow-up by: Vernie Murders,  May 01, 2009 3:15 PM

## 2010-11-12 NOTE — Assessment & Plan Note (Signed)
Summary: CONGESTION BACK PAIN/MHF   Vital Signs:  Patient profile:   50 year old female Temp:     98.3 degrees F Pulse rate:   70 / minute Pulse rhythm:   regular BP sitting:   130 / 90  Primary Care Provider:  Dr. Birdie Sons  CC:  URI symptoms.  History of Present Illness:  URI Symptoms      [name] who presents with URI symptoms.  The symptoms began 3 days ago.  The severity is described as mild.  The patient reports nasal congestion and dry cough, but denies clear nasal discharge, purulent nasal discharge, sore throat, productive cough, earache, and sick contacts.  The patient denies fever and stiff neck.  The patient also reports itchy watery eyes.  The patient denies the following risk factors for Strep sinusitis: unilateral facial pain and Strep exposure.    Current Problems (verified): 1)  Allergic Rhinitis  (ICD-477.9) 2)  Nonspecific Abnormal Toxicological Findings  (ICD-796.0) 3)  Headache  (ICD-784.0) 4)  Menopausal Syndrome  (ICD-627.2) 5)  Sarcoidosis  (ICD-135) 6)  Hypertension  (ICD-401.9) 7)  Hyperlipidemia  (ICD-272.4) 8)  Gerd  (ICD-530.81) 9)  Diabetes Mellitus, Type II  (ICD-250.00)  Current Medications (verified): 1)  Proair Hfa 108 (90 Base) Mcg/act  Aers (Albuterol Sulfate) .... 2 Puffs Three Times A Day As Needed 2)  Atenolol-Chlorthalidone 50-25 Mg Tabs (Atenolol-Chlorthalidone) .... Take 1 Tablet By Mouth Once A Day 3)  Budeprion Xl 300 Mg Tb24 (Bupropion Hcl) .... Take 1 Tablet By Mouth Once A Day 4)  Effexor Xr 75 Mg Cp24 (Venlafaxine Hcl) .... Take 1 Capsule By Mouth Two Times A Day 5)  Lorazepam 1 Mg Tabs (Lorazepam) .... Prn 6)  Novolog Flexpen 100 Unit/ml Soln (Insulin Aspart) .... Three Times A Day As Directed 7)  Onetouch Ultra Test  Strp (Glucose Blood) .... Tid 8)  Potassium Chloride Crys Cr 20 Meq Tbcr (Potassium Chloride Crys Cr) .... Take 1 Tablet By Mouth Two Times A Day 9)  Prednisone 5 Mg Tabs (Prednisone) ....  Take 1 Tablet By Mouth Once A Day 10)  Zegerid 40-1100 Mg Caps (Omeprazole-Sodium Bicarbonate) .... 2 By Mouth Daily 11)  Crestor 20 Mg Tabs (Rosuvastatin Calcium) .[name].. 1 By Mouth Once Daily 12)  Qvar 80 Mcg/act  Aers (Beclomethasone Dipropionate) .... Two Puffs Twice Daily 13)  Pen Needles 31g X 8 Mm  Misc (Insulin Pen Needle) .... Use As Directed 14)  Remicade 100 Mg  Solr (Infliximab) .... Injection Every 6 Weeks 15)  Fluticasone Propionate 50 Mcg/act  Susp (Fluticasone Propionate) .... 2 Sprays Each Nostril Once Daily 16)  Furosemide 20 Mg Tabs (Furosemide) .... Take One (1) By Mouth Daily 17)  Vicodin 5-500 Mg  Tabs (Hydrocodone-Acetaminophen) .... One Tab Every 6 Hours As Needed 18)  Vitamin D 1000 Unit  Caps (Cholecalciferol) .... Two Times A Day 19)  Benicar 40 Mg  Tabs (Olmesartan Medoxomil) .[name].. 1 By Mouth Once Daily 20)  Estrace 1 Mg Tabs (Estradiol) .... Take 1 Tablet By Mouth Once A Day 21)  Slow Release Iron 47.5 Mg Cr-Tabs (Ferrous Sulfate) .... Once Daily 22)  Multiple Vitamin  Tabs (Multiple Vitamin) .... Once Daily 23)  Tussionex Pennkinetic Er 8-10 Mg/51ml Lqcr (Chlorpheniramine-Hydrocodone) .... Take 1 Teaspoon Every 12 Hrs As Needed Cough 24)  Restasis 0.05 % Emul (Cyclosporine) .[name].. 1 Drop Each Eye Daily 25)  Fish Oil 500 Mg Caps (Omega-3 Fatty Acids) .... Once Daily 26)  Lotemax 0.5 % Susp (Loteprednol Etabonate) .[name].. 1 Drop Each Eye Every 6 Hours As Needed 27)  Utac 500-500 Mg Tabs (Methenamine Mand-Sod Phosphate) .... Two Times A Day  Allergies (verified): 1)  ! Methotrexate  Past History:  Past Medical History: Last updated: 09/24/2008 EDEMA (ICD-782.3) HEADACHE (ICD-784.0) MENOPAUSAL SYNDROME (ICD-627.2) UTI (ICD-599.0) SARCOIDOSIS (ICD-135)   -CT Chest 6/08 improvement in Mediastinal LAN   -11/08 start of remecade Rx per [facility] PULMONARY NODULE (ICD-518.89)    -CT Chest 6/08 reduction in size of nodules due to Arava and Pred RX      -probable granulomas due to Sarcoidosis HYPERTENSION (ICD-401.9) HYPERLIPIDEMIA (ICD-272.4) GERD (ICD-530.81) DIABETES MELLITUS, TYPE II (ICD-250.00) ANEMIA-NOS (ICD-285.9)    Past Surgical History: Last updated: 08/21/2006 Ventral Hernia Repair Nissen Fundoplication Cholecystectomy Hysterectomy  Family History: Last updated: 09/15/2007 father-alive MI, DM mother alive and well uncle with SARCOID  Social History: Last updated: 05/31/2007 Married Never Smoked Regular exercise-no Alcohol use-no  Risk Factors: Smoking Status: never (08/22/2006)  Physical Exam  General:  nad, obese Head:  normocephalic and atraumatic Eyes:  pupils equal and pupils round.   Ears:  R ear normal and L ear normal.   Neck:  no masses, thyromegaly, or abnormal cervical nodes Chest Wall:  No deformities, masses, or tenderness noted. Lungs:  normal respiratory effort and no intercostal retractions.   Abdomen:  soft and non-tender.  obese Msk:  No deformity or scoliosis noted of thoracic or lumbar spine.   Neurologic:  cranial nerves II-XII intact and gait normal.   Skin:  turgor normal and color normal.   Psych:  memory intact for recent and remote and normally interactive.     Impression & Recommendations:  Problem # 1:  URI (ICD-465.9) could be allergic trial Her updated medication list for this problem includes:    Tussionex Pennkinetic Er 8-10 Mg/68ml Lqcr (Chlorpheniramine-hydrocodone) .[name]... Take 1 teaspoon every 12 hrs as needed cough  Problem # 2:  BACK PAIN (ICD-724.5)  recurrent uti history  Her updated medication list for this problem includes:    Vicodin 5-500 Mg Tabs (Hydrocodone-acetaminophen) ..... One tab every 6 hours as needed  Orders: T-Culture, Urine ([id number])  Complete Medication List: 1)  Proair Hfa 108 (90 Base) Mcg/act Aers (Albuterol sulfate) .... 2 puffs three times a day as needed 2)  Atenolol-chlorthalidone 50-25 Mg Tabs (Atenolol-chlorthalidone)  .... Take 1 tablet by mouth once a day 3)  Budeprion Xl 300 Mg Tb24 (Bupropion hcl) .... Take 1 tablet by mouth once a day 4)  Effexor Xr 75 Mg Cp24 (Venlafaxine hcl) .... Take 1 capsule by mouth two times a day 5)  Lorazepam 1 Mg Tabs (Lorazepam) .... Prn 6)  Novolog Flexpen 100 Unit/ml Soln (Insulin aspart) .... Three times a day as directed 7)  Onetouch Ultra Test Strp (Glucose blood) .... Tid 8)  Potassium Chloride Crys Cr 20 Meq Tbcr (Potassium chloride crys cr) .... Take 1 tablet by mouth two times a day 9)  Prednisone 5 Mg Tabs (Prednisone) .... Take 1 tablet by mouth once a day 10)  Zegerid 40-1100 Mg Caps (Omeprazole-sodium bicarbonate) .... 2 by mouth daily 11)  Crestor 20 Mg Tabs (Rosuvastatin calcium) .[name].. 1 by mouth once daily 12)  Qvar 80 Mcg/act Aers (Beclomethasone dipropionate) .... Two puffs twice daily 13)  Pen Needles 31g X 8 Mm Misc (Insulin pen needle) .... Use as directed 14)  Remicade 100 Mg Solr (Infliximab) .... Injection every 6 weeks 15)  Fluticasone Propionate  50 Mcg/act Susp (Fluticasone propionate) .... 2 sprays each nostril once daily 16)  Furosemide 20 Mg Tabs (Furosemide) .... Take one (1) by mouth daily 17)  Vicodin 5-500 Mg Tabs (Hydrocodone-acetaminophen) .... One tab every 6 hours as needed 18)  Vitamin D 1000 Unit Caps (Cholecalciferol) .... Two times a day 19)  Benicar 40 Mg Tabs (Olmesartan medoxomil) .[name].. 1 by mouth once daily 20)  Estrace 1 Mg Tabs (Estradiol) .... Take 1 tablet by mouth once a day 21)  Slow Release Iron 47.5 Mg Cr-tabs (Ferrous sulfate) .... Once daily 22)  Multiple Vitamin Tabs (Multiple vitamin) .... Once daily 23)  Tussionex Pennkinetic Er 8-10 Mg/69ml Lqcr (Chlorpheniramine-hydrocodone) .... Take 1 teaspoon every 12 hrs as needed cough 24)  Restasis 0.05 % Emul (Cyclosporine) .[name].. 1 drop each eye daily 25)  Fish Oil 500 Mg Caps (Omega-3 fatty acids) .... Once daily 26)  Lotemax 0.5 % Susp (Loteprednol etabonate) .[name].. 1 drop each  eye every 6 hours as needed 27)  Utac 500-500 Mg Tabs (Methenamine mand-sod phosphate) .... Two times a day

## 2010-11-12 NOTE — Assessment & Plan Note (Signed)
Summary: FUP ON RESULTS/CCM  Medications Added NOVOLOG FLEXPEN 100 UNIT/ML SOLN (INSULIN ASPART) three times a day as directed        Vital Signs:  Patient Profile:   50 Years Old Female Height:     66 inches Pulse rate:   74 / minute Resp:     16 per minute BP sitting:   124 / 78  (left arm) Cuff size:   large  Vitals Entered By: Gladis Riffle, RN (November 24, 2007 12:06 PM)                 Chief Complaint:  review results labs and CT neck--also wants ears checked.    Current Allergies (reviewed today): ! METHOTREXATE        Impression & Recommendations:  Problem # 1:  SHOULDER PAIN, RIGHT (ICD-719.41) reviewed CT of the neck.  Dementia not been read yet.  I reviewed actual images on image cast.  No obvious abnormalities.  I told Miss Elim Delcarlo that I would call her if the radiologist read any other significant abnormality.  Total time 15 minutes The following medications were removed from the medication list:    Diclofenac Sodium 75 Mg Tbec (Diclofenac sodium) .[name]... 1 by mouth two times a day with food for 7 days   Complete Medication List: 1)  Albuterol 90 Mcg/act Aers (Albuterol) .... Inhale 2 puff as directed three times a day 2)  Atenolol-chlorthalidone 50-25 Mg Tabs (Atenolol-chlorthalidone) .... 1/1/2 daily 3)  Budeprion Xl 300 Mg Tb24 (Bupropion hcl) .... Take 1 tablet by mouth once a day 4)  Effexor Xr 75 Mg Cp24 (Venlafaxine hcl) .... Take 1 capsule by mouth once a day 5)  Lorazepam 1 Mg Tabs (Lorazepam) .... Prn 6)  Novolog Flexpen 100 Unit/ml Soln (Insulin aspart) .... Three times a day as directed 7)  Onetouch Ultra Test Strp (Glucose blood) 8)  Potassium Chloride Crys Cr 20 Meq Tbcr (Potassium chloride crys cr) .... Take 1 tablet by mouth once a day 9)  Prednisone 10 Mg Tabs (Prednisone) .[name].. 1 1/2 once daily 10)  Reglan 10 Mg Tabs (Metoclopramide hcl) .... Take 1 tablet by mouth twice a day 11)  Zegerid 40-1100 Mg Caps (Omeprazole-sodium  bicarbonate) .... 2 by mouth daily 12)  Crestor 20 Mg Tabs (Rosuvastatin calcium) .[name].. 1 by mouth once daily 13)  Qvar 80 Mcg/act Aers (Beclomethasone dipropionate) .... Two puffs twice daily 14)  Pen Needles 31g X 8 Mm Misc (Insulin pen needle) .... Use as directed need f/u appt b-4 addtional refills 15)  Premarin 0.3 Mg Tabs (Estrogens conjugated) .... Once daily 16)  Remicade 100 Mg Solr (Infliximab) .... Injection every 8weeks     ]

## 2010-11-12 NOTE — Progress Notes (Signed)
Summary: labs  Phone Note Call from Patient Call back at Work Phone [phone]   Caller: Patient Call For: wright Reason for Call: Lab or Test Results Summary of Call: pt called re: lab results from 06/27/2008 Initial call taken by: Eugene Gavia,  July 02, 2008 2:14 PM  Follow-up for Phone Call        Pt wants lab results from 06-27-08. Please advise. Michel Bickers [facility] [place]  July 02, 2008 2:20 PM  Additional Follow-up for Phone Call Additional follow up Details #1::        tell her white count was a little elevated.  rest of lab was ok    needs to f/u with Dr swords on this  Additional Follow-up by: Storm Frisk MD,  July 03, 2008 1:19 AM    Additional Follow-up for Phone Call Additional follow up Details #2::    PT aware WBC elevated and she needs to f/u with Dr. Cato Mulligan about that. Follow-up by: Michel Bickers CMA,  July 03, 2008 9:04 AM

## 2010-11-12 NOTE — Progress Notes (Signed)
Summary: Triage / loose stool   Phone Note Call from Patient Call back at [phone]   Caller: Patient Call For: Dr. Marina Goodell Reason for Call: Talk to Doctor Summary of Call: Calling about some bloodwork and stool results Initial call taken by: Karna Christmas,  September 22, 2009 10:06 AM  Follow-up for Phone Call        Given available stool results which are negative so far-culture is preliminary..Continues to have 4-5 soft stools a day and a lot of cramping.Was seen by Dr Kirtland Bouchard. this a.m.has cellulitis  of foot and was put on Amoxicillin by him.Was put on Bentyl and Flagyl by Gunnar Fusi on 09/18/09. Follow-up by: Teryl Lucy RN,  September 22, 2009 3:44 PM  Additional Follow-up for Phone Call Additional follow up Details #1::        Give her another 10 day course of flagyl and ok to use immodium 1-2 two times a day as needed. Additional Follow-up by: Hilarie Fredrickson MD,  September 23, 2009 8:25 AM    Additional Follow-up for Phone Call Additional follow up Details #2::    Pt. ntfd. of Dr.Perry's orders and given lab results .Final stool culture results are still pending. Follow-up by: Teryl Lucy RN,  September 23, 2009 9:44 AM  Prescriptions: FLAGYL 500 MG TABS (METRONIDAZOLE) Take 1 tab 3 times daily x 10 days  #30 x 0   Entered by:   Teryl Lucy RN   Authorized by:   Hilarie Fredrickson MD   Signed by:   Teryl Lucy RN on 09/23/2009   Method used:   Electronically to        [facility] (retail)       [address]       [place], [place]  [address]       Ph: [phone]       Fax: [phone]   RxID:   [id number]   Appended Document: Triage / loose stool Elnita Maxwell, final stool culture negative. Please make sure she is using hyoscyamine for pain and that she keeps her follow up with Dr. Marina Goodell. Thanks  Appended Document: Triage / loose stool Pt. made aware of culture results and she will keep appt. with Dr.Perry.

## 2010-11-12 NOTE — Assessment & Plan Note (Signed)
Summary: SINUSES/CCM  Medications Added PREDNISONE 10 MG  TABS (PREDNISONE) 1 1/2 once daily DICLOFENAC SODIUM 75 MG TBEC (DICLOFENAC SODIUM) 1 by mouth two times a day with food CIPRO 500 MG TABS (CIPROFLOXACIN HCL) 1 by mouth two times a day        Vital Signs:  Patient Profile:   50 Years Old Female Temp:     99 degrees F Pulse rate:   96 / minute BP sitting:   142 / 88 Cuff size:   large  Vitals Entered By: Gladis Riffle, RN (October 17, 2007 4:36 PM)                 Chief Complaint:  1.  c/o head congestion, sinus pressure, and achy arms X 1 week; denies fever  2. c/o lower back pain with frequency.  History of Present Illness: very complicated patient with multiple medical problems including sarcoid comes in complaining of diffuse headache and neck discomfort.  States the headache is mostly occipital and the pain seems to radiate down the right side of her neck to the right scapular area.  She denies any neurologic deficits or any other concerns.  She denies any trauma.  There is not any rash or any other change in treatment.  It is worth noting that several weeks ago perhaps several months ago she started Remicade injections by rheumatology for treatment of sarcoid.  Current Allergies (reviewed today): ! METHOTREXATE  Past Medical History:    Reviewed history from 09/15/2007 and no changes required:       Anemia-NOS       Diabetes mellitus, type II       GERD       Hyperlipidemia       Hypertension       pulmonary nodule       OSA       Vocal Cord dysfunction with recurrent cough       Asthma vs Vocal cord dysfundtion       SARCOID  Past Surgical History:    Reviewed history from 08/21/2006 and no changes required:       Ventral Hernia Repair       Nissen Fundoplication       Cholecystectomy       Hysterectomy   Family History:    Reviewed history from 09/15/2007 and no changes required:       father-alive MI, DM       mother alive and well       uncle  with SARCOID  Social History:    Reviewed history from 05/31/2007 and no changes required:       Married       Never Smoked       Regular exercise-no       Alcohol use-no    Review of Systems       no other complaints in a complete ROS    Physical Exam  General:     Well-developed,well-nourished,in no acute distress; alert,appropriate and cooperative throughout examination Head:     normocephalic and atraumatic.   Neck:     No deformities, masses, or tenderness noted. Lungs:     Normal respiratory effort, chest expands symmetrically. Lungs are clear to auscultation, no crackles or wheezes. Heart:     Normal rate and regular rhythm. S1 and S2 normal without gallop, murmur, click, rub or other extra sounds.    Impression & Recommendations:  Problem # 1:  HEADACHE (ICD-784.0) headache is  associated with neck discomfort.  I suspect this is just a tension headache.  If her symptoms persist or worsen she'll need further evaluation.  I doubt that her symptoms are related to her other significant diagnoses. Her updated medication list for this problem includes:    Atenolol-chlorthalidone 50-25 Mg Tabs (Atenolol-chlorthalidone) .[name]... 1/1/2 daily    Diclofenac Sodium 75 Mg Tbec (Diclofenac sodium) .[name]... 1 by mouth two times a day with food   Problem # 2:  UTI (ICD-599.0) urinalysis is remarkable for UTI.  Will treat as such. Orders: UA Dipstick w/o Micro (81002) T-Culture, Urine ([id number])  Her updated medication list for this problem includes:    Cipro 500 Mg Tabs (Ciprofloxacin hcl) .[name]... 1 by mouth two times a day   Complete Medication List: 1)  Albuterol 90 Mcg/act Aers (Albuterol) .... Inhale 2 puff as directed three times a day 2)  Atenolol-chlorthalidone 50-25 Mg Tabs (Atenolol-chlorthalidone) .... 1/1/2 daily 3)  Budeprion Xl 300 Mg Tb24 (Bupropion hcl) .... Take 1 tablet by mouth once a day 4)  Effexor Xr 75 Mg Cp24 (Venlafaxine hcl) .... Take 1 capsule by  mouth once a day 5)  Lorazepam 1 Mg Tabs (Lorazepam) .... Prn 6)  Novolog Flexpen 100 Unit/ml Soln (Insulin aspart) 7)  Onetouch Ultra Test Strp (Glucose blood) 8)  Potassium Chloride Crys Cr 20 Meq Tbcr (Potassium chloride crys cr) .... Take 1 tablet by mouth once a day 9)  Prednisone 10 Mg Tabs (Prednisone) .[name].. 1 1/2 once daily 10)  Reglan 10 Mg Tabs (Metoclopramide hcl) .... Take 1 tablet by mouth twice a day 11)  Zegerid 40-1100 Mg Caps (Omeprazole-sodium bicarbonate) .... 2 by mouth daily 12)  Crestor 20 Mg Tabs (Rosuvastatin calcium) .[name].. 1 by mouth once daily 13)  Qvar 80 Mcg/act Aers (Beclomethasone dipropionate) .... Two puffs twice daily 14)  Amlodipine Besylate 10 Mg Tabs (Amlodipine besylate) .... One tablet daily 15)  Pen Needles 31g X 8 Mm Misc (Insulin pen needle) .... Use as directed need f/u appt b-4 addtional refills 16)  Premarin 0.3 Mg Tabs (Estrogens conjugated) .... Once daily 17)  Diclofenac Sodium 75 Mg Tbec (Diclofenac sodium) .[name].. 1 by mouth two times a day with food 18)  Cipro 500 Mg Tabs (Ciprofloxacin hcl) .[name].. 1 by mouth two times a day     Prescriptions: CIPRO 500 MG TABS (CIPROFLOXACIN HCL) 1 by mouth two times a day  #10 x 0   Entered and Authorized by:   Birdie Sons MD   Signed by:   Birdie Sons MD on 10/19/2007   Method used:   Electronically sent to ...       [facility]       [address]       [place], [place]  [address]       Ph: [phone]       Fax: [phone]   RxID:   [id number] DICLOFENAC SODIUM 75 MG TBEC (DICLOFENAC SODIUM) 1 by mouth two times a day with food  #60 x 0   Entered and Authorized by:   Birdie Sons MD   Signed by:   Birdie Sons MD on 10/17/2007   Method used:   Electronically sent to ...       [facility]       [address]       [place], [place]  [address]  Ph: [phone]       Fax: [phone]   RxID:    [id number]  ] Laboratory Results   Urine Tests    Routine Urinalysis   Color: yellow Appearance: Clear Glucose: negative   (Normal Range: Negative) Bilirubin: 1+   (Normal Range: Negative) Ketone: trace (5)   (Normal Range: Negative) Spec. Gravity: 1.025   (Normal Range: 1.003-1.035) Blood: negative   (Normal Range: Negative) pH: 5.5   (Normal Range: 5.0-8.0) Protein: negative   (Normal Range: Negative) Urobilinogen: 0.2   (Normal Range: 0-1) Nitrite: negative   (Normal Range: Negative) Leukocyte Esterace: 3+   (Normal Range: Negative)    Comments: ...................................................................Milica Zimonjic  October 17, 2007 5:03 PM     Appended Document: SINUSES/CCM palpation.  She does have a UTI.  [facility].  Appended Document: SINUSES/CCM Patient aware & will get med for UTI.

## 2010-11-12 NOTE — Letter (Signed)
Summary: EGD Instructions  [facility]  [address] [place], [place] [address]   Phone: [phone]  Fax: [phone]       [name]    08/09/1961    MRN: [id number]       Procedure Day /Date:MONDAY, 11/17/09     Arrival Time: 8:00 AM     Procedure Time:9:00 AM     Location of Procedure:                    [facility] (4th Floor)  PREPARATION FOR ENDOSCOPY   On MONDAY THE DAY OF THE PROCEDURE:  1.   No solid foods, milk or milk products are allowed after midnight the night before your procedure.  2.   Do not drink anything colored red or purple.  Avoid juices with pulp.  No orange juice.  3.  You may drink clear liquids until7:00 AM, which is 2 hours before your procedure.                                                                                                CLEAR LIQUIDS INCLUDE: Water Jello Ice Popsicles Tea (sugar ok, no milk/cream) Powdered fruit flavored drinks Coffee (sugar ok, no milk/cream) Gatorade Juice: apple, white grape, white cranberry  Lemonade Clear bullion, consomm, broth Carbonated beverages (any kind) Strained chicken noodle soup Hard Candy   MEDICATION INSTRUCTIONS  Unless otherwise instructed, you should take regular prescription medications with a small sip of water as early as possible the morning of your procedure.  HOLD METFORMIN MORNING OF PROCEDURE PER DR. Caidyn Henricksen             OTHER INSTRUCTIONS  You will need a responsible adult at least 52 years of age to accompany you and drive you home.   This person must remain in the waiting room during your procedure.  Wear loose fitting clothing that is easily removed.  Leave jewelry and other valuables at home.  However, you may wish to bring a book to read or an iPod/MP3 player to listen to music as you wait for your procedure to start.  Remove all body piercing jewelry and leave at home.  Total time from sign-in until discharge is  approximately 2-3 hours.  You should go home directly after your procedure and rest.  You can resume normal activities the day after your procedure.  The day of your procedure you should not:   Drive   Make legal decisions   Operate machinery   Drink alcohol   Return to work  You will receive specific instructions about eating, activities and medications before you leave.    The above instructions have been reviewed and explained to me by   _______________________    I fully understand and can verbalize these instructions _____________________________ Date _________

## 2010-11-12 NOTE — Assessment & Plan Note (Signed)
Summary: 3 month rov/njr   Vital Signs:  Patient profile:   50 year old female Weight:      280 pounds Temp:     98.1 degrees F Pulse rate:   80 / minute Resp:     28 per minute BP sitting:   134 / 78  (left arm) Cuff size:   large  Vitals Entered By: Gladis Riffle, RN (December 24, 2008 8:38 AM)  Reason for Visit 3 month rov, S/P right shoulder surgery  Primary Care Provider:  Dr. Birdie Sons   History of Present Illness:  Follow-Up Visit      [name] who presents for Follow-up visit.  The patient denies chest pain, palpitations, dizziness, syncope, low blood sugar symptoms, high blood sugar symptoms, edema, SOB, DOE, PND, and orthopnea.  Since the last visit the patient notes no new problems or concerns.  The patient reports taking meds as prescribed and not monitoring BP.  When questioned about possible medication side effects, the patient notes none.     Hot falshes for 3 weeks---hx of hysterectomy (has at least one ovary---her report)  Past Medical History: EDEMA (ICD-782.3) HEADACHE (ICD-784.0) MENOPAUSAL SYNDROME (ICD-627.2) UTI (ICD-599.0) SARCOIDOSIS (ICD-135)   -CT Chest 6/08 improvement in Mediastinal LAN   -11/08 start of remecade Rx per [facility] PULMONARY NODULE (ICD-518.89)    -CT Chest 6/08 reduction in size of nodules due to Arava and Pred RX     -probable granulomas due to Sarcoidosis HYPERTENSION (ICD-401.9) HYPERLIPIDEMIA (ICD-272.4) GERD (ICD-530.81) DIABETES MELLITUS, TYPE II (ICD-250.00) ANEMIA-NOS (ICD-285.9)    Past Surgical History: Ventral Hernia Repair Nissen Fundoplication Cholecystectomy Hysterectomy  Social History: Married Never Smoked Regular exercise-no Alcohol use-no  Family History: father-alive MI, DM mother alive and well uncle with SARCOID no other complaints in a complete ROS   Medications Prior to Update: 1)  Proair Hfa 108 (90 Base) Mcg/act  Aers (Albuterol Sulfate) .... 2 Puffs Three  Times A Day As Needed 2)  Atenolol-Chlorthalidone 50-25 Mg Tabs (Atenolol-Chlorthalidone) .... Take 1 Tablet By Mouth Once A Day 3)  Budeprion Xl 300 Mg Tb24 (Bupropion Hcl) .... Take 1 Tablet By Mouth Once A Day 4)  Effexor Xr 75 Mg Cp24 (Venlafaxine Hcl) .... Take 1 Capsule By Mouth Once A Day 5)  Lorazepam 1 Mg Tabs (Lorazepam) .... Prn 6)  Novolog Flexpen 100 Unit/ml Soln (Insulin Aspart) .... Three Times A Day As Directed 7)  Onetouch Ultra Test  Strp (Glucose Blood) .... Tid 8)  Potassium Chloride Crys Cr 20 Meq Tbcr (Potassium Chloride Crys Cr) .... Take 1 Tablet By Mouth Two Times A Day 9)  Prednisone 5 Mg Tabs (Prednisone) .... Take 1 Tablet By Mouth Once A Day 10)  Zegerid 40-1100 Mg Caps (Omeprazole-Sodium Bicarbonate) .... 2 By Mouth Daily 11)  Crestor 20 Mg Tabs (Rosuvastatin Calcium) .[name].. 1 By Mouth Once Daily 12)  Qvar 80 Mcg/act  Aers (Beclomethasone Dipropionate) .... Two Puffs Twice Daily 13)  Pen Needles 31g X 8 Mm  Misc (Insulin Pen Needle) .... Use As Directed 14)  Remicade 100 Mg  Solr (Infliximab) .... Injection Every 6 Weeks 15)  Fluticasone Propionate 50 Mcg/act  Susp (Fluticasone Propionate) .... 2 Sprays Each Nostril Once Daily 16)  Furosemide 20 Mg Tabs (Furosemide) .... Take One (1) By Mouth Daily 17)  Vicodin 5-500 Mg  Tabs (Hydrocodone-Acetaminophen) .... One Tab Every 6 Hours As Needed 18)  Vitamin D 1000 Unit  Caps (Cholecalciferol) .Marland KitchenMarland KitchenMarland Kitchen  Two Times A Day 19)  Benicar 40 Mg  Tabs (Olmesartan Medoxomil) .[name].. 1 By Mouth Once Daily 20)  Premarin 0.9 Mg Tabs (Estrogens Conjugated) .... Take 1 Tablet By Mouth Once A Day 21)  Slow Release Iron 47.5 Mg Cr-Tabs (Ferrous Sulfate) .... Once Daily 22)  Multiple Vitamin  Tabs (Multiple Vitamin) .... Once Daily 23)  Tussionex Pennkinetic Er 8-10 Mg/1ml Lqcr (Chlorpheniramine-Hydrocodone) .... Take 1 Teaspoon Every 12 Hrs As Needed Cough 24)  Restasis 0.05 % Emul (Cyclosporine) .[name].. 1 Drop Each Eye Daily 25)  Fish Oil 500 Mg  Caps (Omega-3 Fatty Acids) .... Once Daily 26)  Lotemax 0.5 % Susp (Loteprednol Etabonate) .[name].. 1 Drop Each Eye Every 6 Hours As Needed 27)  Utac 500-500 Mg Tabs (Methenamine Mand-Sod Phosphate) .... Two Times A Day 28)  Xyzal 5 Mg  Tabs (Levocetirizine Dihydrochloride) .... By Mouth Daily  Current Medications (verified): 1)  Proair Hfa 108 (90 Base) Mcg/act  Aers (Albuterol Sulfate) .... 2 Puffs Three Times A Day As Needed 2)  Atenolol-Chlorthalidone 50-25 Mg Tabs (Atenolol-Chlorthalidone) .... Take 1 Tablet By Mouth Once A Day 3)  Budeprion Xl 300 Mg Tb24 (Bupropion Hcl) .... Take 1 Tablet By Mouth Once A Day 4)  Effexor Xr 75 Mg Cp24 (Venlafaxine Hcl) .... Take 1 Capsule By Mouth Once A Day 5)  Lorazepam 1 Mg Tabs (Lorazepam) .... Prn 6)  Novolog Flexpen 100 Unit/ml Soln (Insulin Aspart) .... Three Times A Day As Directed 7)  Onetouch Ultra Test  Strp (Glucose Blood) .... Tid 8)  Potassium Chloride Crys Cr 20 Meq Tbcr (Potassium Chloride Crys Cr) .... Take 1 Tablet By Mouth Two Times A Day 9)  Prednisone 5 Mg Tabs (Prednisone) .... Take 1 Tablet By Mouth Once A Day 10)  Zegerid 40-1100 Mg Caps (Omeprazole-Sodium Bicarbonate) .... 2 By Mouth Daily 11)  Crestor 20 Mg Tabs (Rosuvastatin Calcium) .[name].. 1 By Mouth Once Daily 12)  Qvar 80 Mcg/act  Aers (Beclomethasone Dipropionate) .... Two Puffs Twice Daily 13)  Pen Needles 31g X 8 Mm  Misc (Insulin Pen Needle) .... Use As Directed 14)  Remicade 100 Mg  Solr (Infliximab) .... Injection Every 6 Weeks 15)  Fluticasone Propionate 50 Mcg/act  Susp (Fluticasone Propionate) .... 2 Sprays Each Nostril Once Daily 16)  Furosemide 20 Mg Tabs (Furosemide) .... Take One (1) By Mouth Daily 17)  Vicodin 5-500 Mg  Tabs (Hydrocodone-Acetaminophen) .... One Tab Every 6 Hours As Needed 18)  Vitamin D 1000 Unit  Caps (Cholecalciferol) .... Two Times A Day 19)  Benicar 40 Mg  Tabs (Olmesartan Medoxomil) .[name].. 1 By Mouth Once Daily 20)  Premarin 0.9 Mg Tabs  (Estrogens Conjugated) .... Take 1 Tablet By Mouth Once A Day 21)  Slow Release Iron 47.5 Mg Cr-Tabs (Ferrous Sulfate) .... Once Daily 22)  Multiple Vitamin  Tabs (Multiple Vitamin) .... Once Daily 23)  Tussionex Pennkinetic Er 8-10 Mg/54ml Lqcr (Chlorpheniramine-Hydrocodone) .... Take 1 Teaspoon Every 12 Hrs As Needed Cough 24)  Restasis 0.05 % Emul (Cyclosporine) .[name].. 1 Drop Each Eye Daily 25)  Fish Oil 500 Mg Caps (Omega-3 Fatty Acids) .... Once Daily 26)  Lotemax 0.5 % Susp (Loteprednol Etabonate) .[name].. 1 Drop Each Eye Every 6 Hours As Needed 27)  Utac 500-500 Mg Tabs (Methenamine Mand-Sod Phosphate) .... Two Times A Day 28)  Xyzal 5 Mg  Tabs (Levocetirizine Dihydrochloride) .... By Mouth Daily  Allergies (verified): 1)  ! Methotrexate  Physical Exam  General:  cushingoid faciesobese.   Head:  normocephalic and atraumatic Eyes:  pupils equal and pupils round.   Ears:  R ear normal and L ear normal.   Nose:  no deformity, discharge, inflammation, or lesions Neck:  no masses, thyromegaly, or abnormal cervical nodes Chest Wall:  no deformities noted Lungs:  normal respiratory effort and no intercostal retractions.   Heart:  normal rate and regular rhythm.   Abdomen:  soft and non-tender.  obese Msk:  No deformity or scoliosis noted of thoracic or lumbar spine.   Neurologic:  cranial nerves II-XII intact and gait normal.   Skin:  turgor normal and color normal.   Psych:  normally interactive and good eye contact.     Impression & Recommendations:  Problem # 1:  HEADACHE (ICD-784.0) resolved Her updated medication list for this problem includes:    Atenolol-chlorthalidone 50-25 Mg Tabs (Atenolol-chlorthalidone) .[name]... Take 1 tablet by mouth once a day    Vicodin 5-500 Mg Tabs (Hydrocodone-acetaminophen) ..... One tab every 6 hours as needed  Problem # 2:  SARCOIDOSIS (ICD-135) f/u by rheumatology (remicade) and dr Delford Field  Problem # 3:  DIABETES MELLITUS, TYPE II  (ICD-250.00)  needs labs today previously fairly well controlled Her updated medication list for this problem includes:    Novolog Flexpen 100 Unit/ml Soln (Insulin aspart) .[name]... Three times a day as directed    Benicar 40 Mg Tabs (Olmesartan medoxomil) .[name]... 1 by mouth once daily  Labs Reviewed: Creat: 0.8 (09/30/2008)     Last Eye Exam: normal-pt's report (07/11/2008) HgBA1c: 6.7 (09/30/2008)  6.3 (06/05/2008)  Orders: Venipuncture ([address]) TLB-Lipid Panel (80061-LIPID) TLB-Hepatic/Liver Function Pnl (80076-HEPATIC) TLB-BMP (Basic Metabolic Panel-BMET) (80048-METABOL) TLB-A1C / Hgb A1C (Glycohemoglobin) (83036-A1C)  Problem # 4:  GERD (ICD-530.81) continue current medications \ sxs are well controlled Her updated medication list for this problem includes:    Zegerid 40-1100 Mg Caps (Omeprazole-sodium bicarbonate) .[name]... 2 by mouth daily  Labs Reviewed: Hgb: 12.6 (07/31/2008)   Hct: 36.5 (07/31/2008)  Problem # 5:  HYPERLIPIDEMIA (ICD-272.4)  Her updated medication list for this problem includes:    Crestor 20 Mg Tabs (Rosuvastatin calcium) .[name]... 1 by mouth once daily  Labs Reviewed: Chol: 255 (09/30/2008)   HDL: 43.5 (09/30/2008)   LDL: DEL (09/30/2008)   TG: 376 (09/30/2008) SGOT: 24 (09/30/2008)   SGPT: 24 (09/30/2008)  Prior 10 Yr Risk Heart Disease: 8 % (09/21/2006)  Problem # 6:  HYPERTENSION (ICD-401.9) fair control continue current medications  Her updated medication list for this problem includes:    Atenolol-chlorthalidone 50-25 Mg Tabs (Atenolol-chlorthalidone) .[name]... Take 1 tablet by mouth once a day    Furosemide 20 Mg Tabs (Furosemide) .[name]... Take one (1) by mouth daily    Benicar 40 Mg Tabs (Olmesartan medoxomil) .[name]... 1 by mouth once daily  BP today: 134/78 Prior BP: 120/76 (12/18/2008)  Prior 10 Yr Risk Heart Disease: 8 % (09/21/2006)  Labs Reviewed: Creat: 0.8 (09/30/2008) Chol: 255 (09/30/2008)   HDL: 43.5 (09/30/2008)   LDL: DEL (09/30/2008)    TG: 376 (09/30/2008)  Problem # 7:  MENOPAUSAL SYNDROME (ICD-627.2) already on premarin---has worsening menopausal symptoms (doubt any other cause of hot flashes) will change premarin Her updated medication list for this problem includes:    Estrace 1 Mg Tabs (Estradiol) .[name]... Take 1 tablet by mouth once a day side effects discussed  Complete Medication List: 1)  Proair Hfa 108 (90 Base) Mcg/act Aers (Albuterol sulfate) .... 2 puffs three times a day as needed 2)  Atenolol-chlorthalidone 50-25 Mg Tabs (Atenolol-chlorthalidone) .Marland KitchenMarland KitchenMarland Kitchen  Take 1 tablet by mouth once a day 3)  Budeprion Xl 300 Mg Tb24 (Bupropion hcl) .... Take 1 tablet by mouth once a day 4)  Effexor Xr 75 Mg Cp24 (Venlafaxine hcl) .... Take 1 capsule by mouth once a day 5)  Lorazepam 1 Mg Tabs (Lorazepam) .... Prn 6)  Novolog Flexpen 100 Unit/ml Soln (Insulin aspart) .... Three times a day as directed 7)  Onetouch Ultra Test Strp (Glucose blood) .... Tid 8)  Potassium Chloride Crys Cr 20 Meq Tbcr (Potassium chloride crys cr) .... Take 1 tablet by mouth two times a day 9)  Prednisone 5 Mg Tabs (Prednisone) .... Take 1 tablet by mouth once a day 10)  Zegerid 40-1100 Mg Caps (Omeprazole-sodium bicarbonate) .... 2 by mouth daily 11)  Crestor 20 Mg Tabs (Rosuvastatin calcium) .[name].. 1 by mouth once daily 12)  Qvar 80 Mcg/act Aers (Beclomethasone dipropionate) .... Two puffs twice daily 13)  Pen Needles 31g X 8 Mm Misc (Insulin pen needle) .... Use as directed 14)  Remicade 100 Mg Solr (Infliximab) .... Injection every 6 weeks 15)  Fluticasone Propionate 50 Mcg/act Susp (Fluticasone propionate) .... 2 sprays each nostril once daily 16)  Furosemide 20 Mg Tabs (Furosemide) .... Take one (1) by mouth daily 17)  Vicodin 5-500 Mg Tabs (Hydrocodone-acetaminophen) .... One tab every 6 hours as needed 18)  Vitamin D 1000 Unit Caps (Cholecalciferol) .... Two times a day 19)  Benicar 40 Mg Tabs (Olmesartan medoxomil) .[name].. 1 by mouth once  daily 20)  Estrace 1 Mg Tabs (Estradiol) .... Take 1 tablet by mouth once a day 21)  Slow Release Iron 47.5 Mg Cr-tabs (Ferrous sulfate) .... Once daily 22)  Multiple Vitamin Tabs (Multiple vitamin) .... Once daily 23)  Tussionex Pennkinetic Er 8-10 Mg/79ml Lqcr (Chlorpheniramine-hydrocodone) .... Take 1 teaspoon every 12 hrs as needed cough 24)  Restasis 0.05 % Emul (Cyclosporine) .[name].. 1 drop each eye daily 25)  Fish Oil 500 Mg Caps (Omega-3 fatty acids) .... Once daily 26)  Lotemax 0.5 % Susp (Loteprednol etabonate) .[name].. 1 drop each eye every 6 hours as needed 27)  Utac 500-500 Mg Tabs (Methenamine mand-sod phosphate) .... Two times a day 28)  Xyzal 5 Mg Tabs (Levocetirizine dihydrochloride) .... By mouth daily  Patient Instructions: 1)  Please schedule a follow-up appointment in 4 months. Prescriptions: ESTRACE 1 MG TABS (ESTRADIOL) Take 1 tablet by mouth once a day  #30 x 11   Entered and Authorized by:   Birdie Sons MD   Signed by:   Birdie Sons MD on 12/24/2008   Method used:   Electronically to        [facility] (retail)       [address]       [place], [place]  [address]       Ph: [phone]       Fax: [phone]   RxID:   [id number]   Appended Document: 3 month rov/njr

## 2010-11-12 NOTE — Assessment & Plan Note (Signed)
Summary: 2 month fup//ccm   Vital Signs:  Patient profile:   50 year old female Menstrual status:  hysterectomy Weight:      230 pounds Temp:     98.1 degrees F oral Pulse rate:   80 / minute Pulse rhythm:   regular BP sitting:   122 / 80  (left arm) Cuff size:   large  Vitals Entered By: Alfred Levins, CMA (October 20, 2010 8:42 AM) CC: f/u, dysuria   Primary Care Provider:  Birdie Sons, MD   CC:  f/u and dysuria.  History of Present Illness:  Follow-Up Visit      [name] who presents for Follow-up visit.  The patient denies chest pain and palpitations.  Since the last visit the patient notes no new problems or concerns.  The patient reports taking meds as prescribed, not monitoring BP, not monitoring blood sugars, and dietary compliance.  When questioned about possible medication side effects, the patient notes none.   has been on steroids for sarcoid  All other systems reviewed and were negative except mild dysuria  Current Problems (verified): 1)  Gout  (ICD-274.9) 2)  Anemia-unspecified  (ICD-285.9) 3)  Obesity  (ICD-278.00) 4)  Sleep Apnea  (ICD-780.57) 5)  Sarcoidosis  (ICD-135) 6)  Hypertension  (ICD-401.9) 7)  Hyperlipidemia  (ICD-272.4) 8)  Gerd  (ICD-530.81) 9)  Diabetes Mellitus, Type II  (ICD-250.00)  Current Medications (verified): 1)  Atenolol-Chlorthalidone 50-25 Mg Tabs (Atenolol-Chlorthalidone) .... Take 1 Tablet By Mouth Once A Day 2)  Effexor Xr 75 Mg Cp24 (Venlafaxine Hcl) .... Take 1 Tablet By Mouth Once A Day 3)  Lorazepam 1 Mg Tabs (Lorazepam) .... One Every 6 Hrs As Needed 4)  Remicade 100 Mg  Solr (Infliximab) .... Injection Every 6 Weeks 5)  Omnaris 50 Mcg/act Susp (Ciclesonide) .... One Spray Each Nostril Once Daily 6)  Estrace 1 Mg Tabs (Estradiol) .... Take 1 Tablet By Mouth Once A Day 7)  Restasis 0.05 % Emul (Cyclosporine) .[name].. 1 Drop Each Eye Daily 8)  Lotemax 0.5 % Susp (Loteprednol Etabonate) .[name].. 1 Drop  Each Eye Every 6 Hours As Needed 9)  Simvastatin 40 Mg Tabs (Simvastatin) .... Take One Tablet At Bedtime 10)  Levbid 0.375 Mg Xr12h-Tab (Hyoscyamine Sulfate) .[name].. 1 By Mouth Two Times A Day As Needed 11)  Prednisone 5 Mg Tabs (Prednisone) .... Take 1 Tablet By Mouth Every Other Day 12)  Cpap 7 Cm 13)  Nystatin-Triamcinolone 100000-0.1 Unit/gm-% Crea (Nystatin-Triamcinolone) .... Apply Two Times A Day To Affected Area As Needed 14)  Furosemide 20 Mg Tabs (Furosemide) .... Once Daily 15)  Klor-Con M20 20 Meq Cr-Tabs (Potassium Chloride Crys Cr) .... Take 3 Tablet By Mouth Once A Day 16)  Budeprion Xl 300 Mg Xr24h-Tab (Bupropion Hcl) .... Take 1 Tablet By Mouth Once A Day 17)  Tussicaps 10-8 Mg Xr12h-Cap (Hydrocod Polst-Chlorphen Polst) .... One By Mouth Two Times A Day As Needed Cough 18)  Benzonatate 100 Mg Caps (Benzonatate) .... Take One Every 4hours As Needed For Cough 19)  Dexilant 60 Mg Cpdr (Dexlansoprazole) .... Take 1 Capsule By Mouth Once A Day 20)  Tramadol Hcl 50 Mg Tabs (Tramadol Hcl) .[name].. 1-2 By Mouth Q 6 Hours As Needed Severe Headache  Allergies (verified): 1)  ! Methotrexate  Past History:  Past Medical History: Last updated: 08/20/2010 SARCOIDOSIS (ICD-135)   -CT Chest 6/08 improvement in Mediastinal LAN   -11/08 start of remecade Rx per [facility] PULMONARY NODULE (ICD-518.89)    -CT Chest 6/08 reduction in size of nodules due to Arava and Pred RX     -probable granulomas due to Sarcoidosis HYPERTENSION (ICD-401.9) HYPERLIPIDEMIA (ICD-272.4) GERD (ICD-530.81) Sleep APnea DIABETES MELLITUS, TYPE II (ICD-250.00) ANEMIA-NOS (ICD-285.9)  Fundic Gland Polyp Atypical Chest pain    -10/11 cardiac cath neg for CAD Gout  Past Surgical History: Last updated: 05/21/2010 Ventral Hernia Repair Nissen Fundoplication Cholecystectomy Hysterectomy right knee arthroscopy May 11, 2010  Family History: Last updated: 10/20/2009 father-alive MI, DM mother  alive and well uncle with SARCOID No FH of Colon Cancer: Family History Emphysema-Mother, Father  Family History Asthma-Asthma Family History COPD -mother  Social History: Last updated: 10/20/2009 Married, live with husband Never Smoked Regular exercise-no Alcohol use-no Illicit Drug Use - no  Risk Factors: Exercise: no (05/31/2007)  Risk Factors: Smoking Status: never (06/17/2010)  Physical Exam  General:  alert and well-developed.   Eyes:  pupils equal and pupils round.   Ears:  R ear normal and L ear normal.   Neck:  No deformities, masses, or tenderness noted. Lungs:  Normal respiratory effort, chest expands symmetrically. Lungs are clear to auscultation, no crackles or wheezes. Heart:  normal rate and regular rhythm.   Abdomen:  Bowel sounds positive,abdomen soft and non-tender without masses, organomegaly or hernias noted. Neurologic:  cranial nerves II-XII intact and gait normal.    Diabetes Management Exam:    Eye Exam:       Eye Exam done elsewhere          Date: 10/11/2010          Results: normal-pt's report          Done by: ophthalm   Impression & Recommendations:  Problem # 1:  GOUT (ICD-274.9) no recurrence and not on any meds  Problem # 2:  SARCOIDOSIS (ICD-135) currently doing well Orders: Venipuncture (40981) Specimen Handling (19147) TLB-Sedimentation Rate (ESR) (85652-ESR)  Problem # 3:  HYPERTENSION (ICD-401.9) controlled continue current medications  Her updated medication list for this problem includes:    Atenolol-chlorthalidone 50-25 Mg Tabs (Atenolol-chlorthalidone) .[name]... Take 1 tablet by mouth once a day    Furosemide 20 Mg Tabs (Furosemide) ..... Once daily  BP today: 122/80 Prior BP: 130/76 (09/23/2010)  Prior 10 Yr Risk Heart Disease: 8 % (09/21/2006)  Labs Reviewed: K+: 4.2 (10/13/2010) Creat: : 0.5 (10/13/2010)   Chol: 251 (10/13/2010)   HDL: 41.80 (10/13/2010)   LDL: DEL (09/30/2008)   TG: 296.0 (10/13/2010)  Problem  # 4:  DIABETES MELLITUS, TYPE II (ICD-250.00) not as well controlled could be related to steroids encouraged continued weight loss Labs Reviewed: Creat: 0.5 (10/13/2010)     Last Eye Exam: normal-pt's report (10/11/2010) Reviewed HgBA1c results: 8.2 (10/13/2010)  7.1 (07/21/2010)  Complete Medication List: 1)  Atenolol-chlorthalidone 50-25 Mg Tabs (Atenolol-chlorthalidone) .... Take 1 tablet by mouth once a day 2)  Effexor Xr 75 Mg Cp24 (Venlafaxine hcl) .... Take 1 tablet by mouth once a day 3)  Lorazepam 1 Mg Tabs (Lorazepam) .... One every 6 hrs as needed 4)  Remicade 100 Mg Solr (Infliximab) .... Injection every 6 weeks 5)  Omnaris 50 Mcg/act Susp (Ciclesonide) .... One spray each nostril once daily 6)  Estrace 1 Mg Tabs (Estradiol) .... Take 1 tablet by mouth once a day 7)  Restasis 0.05 % Emul (Cyclosporine) .[name].. 1 drop each eye daily 8)  Lotemax 0.5 % Susp (Loteprednol etabonate) .[name].. 1 drop each eye every 6  hours as needed 9)  Levbid 0.375 Mg Xr12h-tab (Hyoscyamine sulfate) .[name].. 1 by mouth two times a day as needed 10)  Prednisone 5 Mg Tabs (Prednisone) .... Take 1 tablet by mouth every other day 11)  Cpap 7 Cm  12)  Nystatin-triamcinolone 100000-0.1 Unit/gm-% Crea (Nystatin-triamcinolone) .... Apply two times a day to affected area as needed 13)  Furosemide 20 Mg Tabs (Furosemide) .... Once daily 14)  Klor-con M20 20 Meq Cr-tabs (Potassium chloride crys cr) .... Take 3 tablet by mouth once a day 15)  Budeprion Xl 300 Mg Xr24h-tab (Bupropion hcl) .... Take 1 tablet by mouth once a day 16)  Tussicaps 10-8 Mg Xr12h-cap (Hydrocod polst-chlorphen polst) .... One by mouth two times a day as needed cough 17)  Benzonatate 100 Mg Caps (Benzonatate) .... Take one every 4hours as needed for cough 18)  Dexilant 60 Mg Cpdr (Dexlansoprazole) .... Take 1 capsule by mouth once a day 19)  Tramadol Hcl 50 Mg Tabs (Tramadol hcl) .[name].. 1-2 by mouth q 6 hours as needed severe headache 20)  Lipitor  40 Mg Tabs (Atorvastatin calcium) .... Take 1 tab by mouth daily  Other Orders: UA Dipstick w/o Micro (automated)  (81003) T-Culture, Urine ([id number]) Prescriptions: LIPITOR 40 MG TABS (ATORVASTATIN CALCIUM) Take 1 tab by mouth daily  #90 x 3   Entered and Authorized by:   Birdie Sons MD   Signed by:   Birdie Sons MD on 10/20/2010   Method used:   Electronically to        [facility] (retail)       [address]       [place], [place]  [address]       Ph: [phone]       Fax: [phone]   RxID:   [id number]    Orders Added: 1)  UA Dipstick w/o Micro (automated)  [81003] 2)  T-Culture, Urine [[id number]] 3)  Est. Patient Level IV [34742] 4)  Venipuncture [59563] 5)  Specimen Handling [99000] 6)  TLB-Sedimentation Rate (ESR) [85652-ESR]    Laboratory Results   Urine Tests  Date/Time Received: October 20, 2010 8:43 AM Date/Time Reported: October 20, 2010 8:43 AM  Routine Urinalysis   Color: yellow Appearance: Clear Glucose: negative   (Normal Range: Negative) Bilirubin: negative   (Normal Range: Negative) Ketone: negative   (Normal Range: Negative) Spec. Gravity: 1.010   (Normal Range: 1.003-1.035) Blood: negative   (Normal Range: Negative) pH: 5.0   (Normal Range: 5.0-8.0) Protein: negative   (Normal Range: Negative) Urobilinogen: 0.2   (Normal Range: 0-1) Nitrite: negative   (Normal Range: Negative) Leukocyte Esterace: trace   (Normal Range: Negative)    Comments: Alfred Levins, CMA  October 20, 2010 8:44 AM

## 2010-11-12 NOTE — Assessment & Plan Note (Signed)
Summary: ROA X 1 MTH / RS   Vital Signs:  Patient profile:   50 year old female Weight:      272 pounds Temp:     97.9 degrees F Pulse rate:   64 / minute Resp:     14 per minute BP sitting:   130 / 72  (right arm) Cuff size:   large  Vitals Entered By: Gladis Riffle, RN (September 09, 2009 8:04 AM)   Primary Care Provider:  Leeanne Deed   History of Present Illness:  Follow-Up Visit      [name] who presents for Follow-up visit.  The patient complains of SOB, but denies chest pain, palpitations, dizziness, syncope, edema, DOE, PND, and orthopnea.  Since the last visit the patient notes no new problems or concerns and being seen by a specialist.  The patient reports taking meds as prescribed.  When questioned about possible medication side effects, the patient notes none. reviewed dr Delford Field' note    Preventive Screening-Counseling & Management  Alcohol-Tobacco     Smoking Status: never  Current Problems (verified): 1)  Headache  (ICD-784.0) 2)  Obesity  (ICD-278.00) 3)  Sleep Apnea  (ICD-780.57) 4)  Nonspecific Abnormal Toxicological Findings  (ICD-796.0) 5)  Menopausal Syndrome  (ICD-627.2) 6)  Sarcoidosis  (ICD-135) 7)  Hypertension  (ICD-401.9) 8)  Hyperlipidemia  (ICD-272.4) 9)  Gerd  (ICD-530.81) 10)  Diabetes Mellitus, Type II  (ICD-250.00)  Current Medications (verified): 1)  Atenolol-Chlorthalidone 50-25 Mg Tabs (Atenolol-Chlorthalidone) .... Take 1 Tablet By Mouth Once A Day 2)  Effexor Xr 75 Mg Cp24 (Venlafaxine Hcl) .... Take 1 Tablet By Mouth Once A Day 3)  Lorazepam 1 Mg Tabs (Lorazepam) .... One Every 6 Hrs As Needed 4)  Novolog Flexpen 100 Unit/ml Soln (Insulin Aspart) .... Three Times A Day As Directed 5)  Onetouch Ultra Test  Strp (Glucose Blood) .... Tid 6)  Potassium Chloride Crys Cr 20 Meq Tbcr (Potassium Chloride Crys Cr) .[name].. 1 Three Times A Day 7)  Prednisone 10 Mg Tabs (Prednisone) .... Take As Directed. 8)  Zegerid 40-1100  Mg Caps (Omeprazole-Sodium Bicarbonate) .... Take 1 Tablet By Mouth Once A Day 9)  Pen Needles 31g X 8 Mm  Misc (Insulin Pen Needle) .... Use As Directed 10)  Remicade 100 Mg  Solr (Infliximab) .... Injection Every 6 Weeks 11)  Fluticasone Propionate 50 Mcg/act  Susp (Fluticasone Propionate) .... 2 Sprays Each Nostril Once Daily 12)  Furosemide 20 Mg Tabs (Furosemide) .... Take One (1) By Mouth Daily 13)  Vicodin 5-500 Mg  Tabs (Hydrocodone-Acetaminophen) .... One Tab Every 6 Hours As Needed 14)  Estrace 1 Mg Tabs (Estradiol) .... Take 1 Tablet By Mouth Once A Day 15)  Restasis 0.05 % Emul (Cyclosporine) .[name].. 1 Drop Each Eye Daily 16)  Lotemax 0.5 % Susp (Loteprednol Etabonate) .[name].. 1 Drop Each Eye Every 6 Hours As Needed 17)  Utac 500-500 Mg Tabs (Methenamine Mand-Sod Phosphate) .... Two Times A Day 18)  Tussionex Pennkinetic Er 8-10 Mg/11ml Lqcr (Chlorpheniramine-Hydrocodone) .[name].. 1 Tsp Every 12 Hr As Needed For Cough, May Make You Sleepy 19)  Diflucan 100 Mg Tabs (Fluconazole) .... Take Two Now X One Dose and Then Take 1 Tablet By Mouth Once A Day Until Bottle Empty  Allergies: 1)  ! Methotrexate  Comments:  Nurse/Medical Assistant: 1 month rov, has been to urgent care and put on antibiotic for sinus infection  The patient's medications and allergies were reviewed  with the patient and were updated in the Medication and Allergy Lists. Gladis Riffle, RN (September 09, 2009 8:05 AM)  Past History:  Past Medical History: Last updated: 04/21/2009 EDEMA (ICD-782.3) HEADACHE (ICD-784.0) MENOPAUSAL SYNDROME (ICD-627.2) UTI (ICD-599.0) SARCOIDOSIS (ICD-135)   -CT Chest 6/08 improvement in Mediastinal LAN   -11/08 start of remecade Rx per [facility] PULMONARY NODULE (ICD-518.89)    -CT Chest 6/08 reduction in size of nodules due to Arava and Pred RX     -probable granulomas due to Sarcoidosis HYPERTENSION (ICD-401.9) HYPERLIPIDEMIA (ICD-272.4) GERD (ICD-530.81) Sleep  APnea DIABETES MELLITUS, TYPE II (ICD-250.00) ANEMIA-NOS (ICD-285.9)    Past Surgical History: Last updated: 08/21/2006 Ventral Hernia Repair Nissen Fundoplication Cholecystectomy Hysterectomy  Family History: Last updated: 05/12/2009 father-alive MI, DM mother alive and well uncle with SARCOID No FH of Colon Cancer:  Social History: Last updated: 05/12/2009 Married Never Smoked Regular exercise-no Alcohol use-no Illicit Drug Use - no  Risk Factors: Exercise: no (05/31/2007)  Risk Factors: Smoking Status: never (09/09/2009)  Review of Systems       All other systems reviewed and were negative   Physical Exam  General:  nad, obese Head:  normocephalic and atraumatic.   Eyes:  pupils equal and pupils round.   Ears:  R ear normal and L ear normal.   Neck:  no masses, thyromegaly, or abnormal cervical nodes Chest Wall:  No deformities, masses, or tenderness noted. Lungs:  Normal respiratory effort, chest expands symmetrically. Lungs are clear to auscultation, no crackles or wheezes. Heart:  normal rate and no murmur.   Abdomen:  Bowel sounds positive,abdomen soft and non-tender without masses, organomegaly or hernias noted. unable to ilicit pain  obese Msk:  No deformity or scoliosis noted of thoracic or lumbar spine.   no joint swelling or erythema Pulses:  R radial normal and L radial normal.   Neurologic:  cranial nerves II-XII intact and gait normal.     Impression & Recommendations:  Problem # 1:  HYPERLIPIDEMIA (ICD-272.4)  needs treatment meds were stopped in an attempt to identify cause of headaches trial simvastain 40mg  by mouth once daily  Labs Reviewed: SGOT: 26 (09/02/2009)   SGPT: 31 (09/02/2009)  Prior 10 Yr Risk Heart Disease: 8 % (09/21/2006)   HDL:41.10 (09/02/2009), 45.80 (05/08/2009)  LDL:DEL (09/30/2008), DEL (06/05/2008)  Chol:275 (09/02/2009), 184 (05/08/2009)  Trig:456.0 (09/02/2009), 383.0 (05/08/2009)  Her updated medication  list for this problem includes:    Simvastatin 40 Mg Tabs (Simvastatin) .[name]... Take one tablet at bedtime start one week after completing diflucan  Problem # 2:  DIABETES MELLITUS, TYPE II (ICD-250.00) in the past 3months she had been on a higher dose of steroids.  Her updated medication list for this problem includes:    Novolog Flexpen 100 Unit/ml Soln (Insulin aspart) .[name]... Three times a day as directed  Labs Reviewed: Creat: 0.7 (09/02/2009)     Last Eye Exam: normal-pt's report (07/11/2008) Reviewed HgBA1c results: 7.1 (09/02/2009)  6.5 (05/08/2009)  Problem # 3:  HYPERTENSION (ICD-401.9) controlled, on meds Her updated medication list for this problem includes:    Atenolol-chlorthalidone 50-25 Mg Tabs (Atenolol-chlorthalidone) .[name]... Take 1 tablet by mouth once a day    Furosemide 20 Mg Tabs (Furosemide) .[name]... Take one (1) by mouth daily  BP today: 130/72 Prior BP: 124/66 (08/05/2009)  Prior 10 Yr Risk Heart Disease: 8 % (09/21/2006)  Labs Reviewed: K+: 3.6 (09/02/2009) Creat: : 0.7 (09/02/2009)   Chol: 275 (09/02/2009)   HDL: 41.10 (09/02/2009)  LDL: DEL (09/30/2008)   TG: 456.0 (09/02/2009)  Problem # 4:  SLEEP APNEA (ICD-780.57) CPAP--not using she is scheduled for another sleep test  Problem # 5:  SARCOIDOSIS (ICD-135) followed by dr. Delford Field and rheumatology  Problem # 6:  BACK PAIN (ICD-724.5)  ongoing and progressive check xray discussed above with patient and husband (he was present during hx and exam and discussion) Her updated medication list for this problem includes:    Vicodin 5-500 Mg Tabs (Hydrocodone-acetaminophen) ..... One tab every 6 hours as needed  Orders: T-Lumbar Spine Complete, 5 Views (71110TC)  Complete Medication List: 1)  Atenolol-chlorthalidone 50-25 Mg Tabs (Atenolol-chlorthalidone) .... Take 1 tablet by mouth once a day 2)  Effexor Xr 75 Mg Cp24 (Venlafaxine hcl) .... Take 1 tablet by mouth once a day 3)  Lorazepam 1 Mg Tabs  (Lorazepam) .... One every 6 hrs as needed 4)  Novolog Flexpen 100 Unit/ml Soln (Insulin aspart) .... Three times a day as directed 5)  Onetouch Ultra Test Strp (Glucose blood) .... Tid 6)  Potassium Chloride Crys Cr 20 Meq Tbcr (Potassium chloride crys cr) .[name].. 1 three times a day 7)  Prednisone 10 Mg Tabs (Prednisone) .... Take as directed. 8)  Zegerid 40-1100 Mg Caps (Omeprazole-sodium bicarbonate) .... Take 1 tablet by mouth once a day 9)  Pen Needles 31g X 8 Mm Misc (Insulin pen needle) .... Use as directed 10)  Remicade 100 Mg Solr (Infliximab) .... Injection every 6 weeks 11)  Fluticasone Propionate 50 Mcg/act Susp (Fluticasone propionate) .... 2 sprays each nostril once daily 12)  Furosemide 20 Mg Tabs (Furosemide) .... Take one (1) by mouth daily 13)  Vicodin 5-500 Mg Tabs (Hydrocodone-acetaminophen) .... One tab every 6 hours as needed 14)  Estrace 1 Mg Tabs (Estradiol) .... Take 1 tablet by mouth once a day 15)  Restasis 0.05 % Emul (Cyclosporine) .[name].. 1 drop each eye daily 16)  Lotemax 0.5 % Susp (Loteprednol etabonate) .[name].. 1 drop each eye every 6 hours as needed 17)  Utac 500-500 Mg Tabs (Methenamine mand-sod phosphate) .... Two times a day 18)  Tussionex Pennkinetic Er 8-10 Mg/54ml Lqcr (Chlorpheniramine-hydrocodone) .[name].. 1 tsp every 12 hr as needed for cough, may make you sleepy 19)  Diflucan 100 Mg Tabs (Fluconazole) .... Take two now x one dose and then take 1 tablet by mouth once a day until bottle empty 20)  Simvastatin 40 Mg Tabs (Simvastatin) .... Take one tablet at bedtime start one week after completing diflucan  Patient Instructions: 1)  Please schedule a follow-up appointment in 3 months. 2)  labs one week prior to visit 3)  lipids---272.4 4)  lfts-995.2 5)  bmet-995.2 6)  A1C-250.02 7)     Prescriptions: SIMVASTATIN 40 MG TABS (SIMVASTATIN) Take one tablet at bedtime start one week after completing diflucan  #90 x 3   Entered and Authorized by:   Birdie Sons  MD   Signed by:   Birdie Sons MD on 09/09/2009   Method used:   Electronically to        [facility] (retail)       [address]       [place], [place]  [address]       Ph: [phone]       Fax: [phone]   RxID:   [id number]

## 2010-11-12 NOTE — Assessment & Plan Note (Signed)
Summary: ELEVATED WBC & AUGMENTIN/PER DR J/PS   Vital Signs:  Patient Profile:   50 Years Old Female Height:     66 inches Weight:      286 pounds Temp:     98.3 degrees F Pulse rate:   64 / minute BP sitting:   108 / 60  (left arm) Cuff size:   large  Vitals Entered By: Gladis Riffle, RN (July 31, 2008 11:26 AM)                 Referred by:  Chase Picket PCP:  Dr. Birdie Sons  Chief Complaint:  told had elevated WBC and put on augmentin--FU.  History of Present Illness: elevated white count emperic antibiotic---see dr Lovell Sheehan note  she has had an elevated WBC---unclear etiology she feels well denies any infectious sxs no fever, chills, sweats no associated sxs  Past Medical History: EDEMA (ICD-782.3) HEADACHE (ICD-784.0) MENOPAUSAL SYNDROME (ICD-627.2) UTI (ICD-599.0) SARCOIDOSIS (ICD-135) PULMONARY NODULE (ICD-518.89) HYPERTENSION (ICD-401.9) HYPERLIPIDEMIA (ICD-272.4) GERD (ICD-530.81) DIABETES MELLITUS, TYPE II (ICD-250.00) ANEMIA-NOS (ICD-285.9)    Past Surgical History: Ventral Hernia Repair Nissen Fundoplication Cholecystectomy Hysterectomy  Social History: Married Never Smoked Regular exercise-no Alcohol use-no  Family History: father-alive MI, DM mother alive and well uncle with SARCOID no other complaints in a complete ROS      Updated Prior Medication List: PROAIR HFA 108 (90 BASE) MCG/ACT  AERS (ALBUTEROL SULFATE) 2 puffs three times a day as needed ATENOLOL-CHLORTHALIDONE 50-25 MG TABS (ATENOLOL-CHLORTHALIDONE) 1/1/2 daily BUDEPRION XL 300 MG TB24 (BUPROPION HCL) Take 1 tablet by mouth once a day EFFEXOR XR 75 MG CP24 (VENLAFAXINE HCL) Take 1 capsule by mouth once a day LORAZEPAM 1 MG TABS (LORAZEPAM) prn NOVOLOG FLEXPEN 100 UNIT/ML SOLN (INSULIN ASPART) three times a day as directed ONETOUCH ULTRA TEST  STRP (GLUCOSE BLOOD) tid POTASSIUM CHLORIDE CRYS CR 20 MEQ TBCR (POTASSIUM CHLORIDE CRYS CR) Take 1 tablet by mouth two times a  day PREDNISONE 10 MG  TABS (PREDNISONE) Take 1 tablet by mouth once a day ZEGERID 40-1100 MG CAPS (OMEPRAZOLE-SODIUM BICARBONATE) 2 by mouth daily CRESTOR 20 MG TABS (ROSUVASTATIN CALCIUM) 1 by mouth once daily QVAR 80 MCG/ACT  AERS (BECLOMETHASONE DIPROPIONATE) two puffs twice daily PEN NEEDLES 31G X 8 MM  MISC (INSULIN PEN NEEDLE) USE AS DIRECTED REMICADE 100 MG  SOLR (INFLIXIMAB) Injection every 6 weeks FLUTICASONE PROPIONATE 50 MCG/ACT  SUSP (FLUTICASONE PROPIONATE) 2 sprays each nostril once daily FUROSEMIDE 20 MG TABS (FUROSEMIDE) Take one (1) by mouth daily VICODIN 5-500 MG  TABS (HYDROCODONE-ACETAMINOPHEN) One tab every 6 hours as needed VITAMIN D 1000 UNIT  CAPS (CHOLECALCIFEROL) two times a day BENICAR 40 MG  TABS (OLMESARTAN MEDOXOMIL) 1 by mouth once daily PREMARIN 0.625 MG TABS (ESTROGENS CONJUGATED) take one tab by mouth once daily SLOW RELEASE IRON 47.5 MG CR-TABS (FERROUS SULFATE) once daily MULTIPLE VITAMIN  TABS (MULTIPLE VITAMIN) once daily TUSSIONEX PENNKINETIC ER 8-10 MG/5ML LQCR (CHLORPHENIRAMINE-HYDROCODONE) as needed AUGMENTIN 875-125 MG TABS (AMOXICILLIN-POT CLAVULANATE) two times a day for 10 days NITROFURANTOIN MACROCRYSTAL 100 MG CAPS (NITROFURANTOIN MACROCRYSTAL) Take 1 capsule by mouth at bedtime  Current Allergies (reviewed today): ! METHOTREXATE     Review of Systems       today has mild ST--no fever/ chills   Physical Exam  General:     Well-developed,well-nourished,in no acute distress; alert,appropriate and cooperative throughout examination Neck:     No deformities, masses, or tenderness noted. Lungs:     Normal respiratory effort, chest expands  symmetrically. Lungs are clear to auscultation, no crackles or wheezes. Heart:     Normal rate and regular rhythm. S1 and S2 normal without gallop, murmur, click, rub or other extra sounds. Abdomen:     Bowel sounds positive,abdomen soft and non-tender without masses, organomegaly or hernias  noted. obese    Impression & Recommendations:  Problem # 1:  LEUKOCYTOSIS (ICD-288.60) unclear etiology suspect related to her medical conditions or one of her multiple dzs processes (sarcoid) will recheck WBC, but will not further evaluate no concerns for malignancy Orders: Venipuncture ([address]) TLB-CBC Platelet - w/Differential (85025-CBCD)   Complete Medication List: 1)  Proair Hfa 108 (90 Base) Mcg/act Aers (Albuterol sulfate) .... 2 puffs three times a day as needed 2)  Atenolol-chlorthalidone 50-25 Mg Tabs (Atenolol-chlorthalidone) .... 1/1/2 daily 3)  Budeprion Xl 300 Mg Tb24 (Bupropion hcl) .... Take 1 tablet by mouth once a day 4)  Effexor Xr 75 Mg Cp24 (Venlafaxine hcl) .... Take 1 capsule by mouth once a day 5)  Lorazepam 1 Mg Tabs (Lorazepam) .... Prn 6)  Novolog Flexpen 100 Unit/ml Soln (Insulin aspart) .... Three times a day as directed 7)  Onetouch Ultra Test Strp (Glucose blood) .... Tid 8)  Potassium Chloride Crys Cr 20 Meq Tbcr (Potassium chloride crys cr) .... Take 1 tablet by mouth two times a day 9)  Prednisone 10 Mg Tabs (Prednisone) .... Take 1 tablet by mouth once a day 10)  Zegerid 40-1100 Mg Caps (Omeprazole-sodium bicarbonate) .... 2 by mouth daily 11)  Crestor 20 Mg Tabs (Rosuvastatin calcium) .[name].. 1 by mouth once daily 12)  Qvar 80 Mcg/act Aers (Beclomethasone dipropionate) .... Two puffs twice daily 13)  Pen Needles 31g X 8 Mm Misc (Insulin pen needle) .... Use as directed 14)  Remicade 100 Mg Solr (Infliximab) .... Injection every 6 weeks 15)  Fluticasone Propionate 50 Mcg/act Susp (Fluticasone propionate) .... 2 sprays each nostril once daily 16)  Furosemide 20 Mg Tabs (Furosemide) .... Take one (1) by mouth daily 17)  Vicodin 5-500 Mg Tabs (Hydrocodone-acetaminophen) .... One tab every 6 hours as needed 18)  Vitamin D 1000 Unit Caps (Cholecalciferol) .... Two times a day 19)  Benicar 40 Mg Tabs (Olmesartan medoxomil) .[name].. 1 by mouth once daily 20)   Premarin 0.625 Mg Tabs (Estrogens conjugated) .... Take one tab by mouth once daily 21)  Slow Release Iron 47.5 Mg Cr-tabs (Ferrous sulfate) .... Once daily 22)  Multiple Vitamin Tabs (Multiple vitamin) .... Once daily 23)  Tussionex Pennkinetic Er 8-10 Mg/67ml Lqcr (Chlorpheniramine-hydrocodone) .... As needed 24)  Augmentin 875-125 Mg Tabs (Amoxicillin-pot clavulanate) .... Two times a day for 10 days 25)  Nitrofurantoin Macrocrystal 100 Mg Caps (Nitrofurantoin macrocrystal) .... Take 1 capsule by mouth at bedtime    ]

## 2010-11-12 NOTE — Letter (Signed)
Summary: Colonoscopy Letter  [facility]  [address] [place], [place] [address]   Phone: [phone]  Fax: [phone]      March 13, 2010 MRN: [id number]   [facility] [address] [place], [place]  [address]   Dear [name],   According to your medical record, it is time for you to schedule a Colonoscopy. The American Cancer Society recommends this procedure as a method to detect early colon cancer. Patients with a family history of colon cancer, or a personal history of colon polyps or inflammatory bowel disease are at increased risk.  This letter has been generated based on the recommendations made at the time of your procedure. If you feel that in your particular situation this may no longer apply, please contact our office.  Please call our office at [phone] to schedule this appointment or to update your records at your earliest convenience.  Thank you for cooperating with [place] to provide you with the very best care possible.   Sincerely,  Wilhemina Bonito. Marina Goodell, M.D.  [facility] [phone]

## 2010-11-12 NOTE — Assessment & Plan Note (Signed)
Summary: hematuria/dm   Vital Signs:  Patient Profile:   50 Years Old Female Height:     66 inches Temp:     98.4 degrees F Pulse rate:   80 / minute BP sitting:   136 / 80  (left arm)  Vitals Entered By: Gladis Riffle, RN (March 27, 2008 3:43 PM)                 Referred by:  Chase Picket PCP:  Dr. Birdie Sons  Chief Complaint:  c/o right flank pain and burning on urination, also blood in tissue when wipes--worried as told side effect of remicade can be kidney infection, and Dysuria.  History of Present Illness:  Dysuria      [name] who presents with Dysuria.  The patient reports burning with urination, urinary frequency, and hematuria, but denies urgency, vaginal discharge, vaginal itching, and vaginal sores.  The patient denies the following associated symptoms: nausea, vomiting, fever, shaking chills, flank pain, abdominal pain, back pain, pelvic pain, and arthralgias.  Risk factors for urinary tract infection include diabetes.  History is significant for recent UTI.    Past Medical History: Anemia-NOS Diabetes mellitus, type II GERD Hyperlipidemia Hypertension pulmonary nodule OSA Vocal Cord dysfunction with recurrent cough Asthma vs Vocal cord dysfundtion SARCOID   Past Surgical History: Ventral Hernia Repair Nissen Fundoplication Cholecystectomy Hysterectomy   Family History: father-alive MI, DM mother alive and well uncle with SARCOID Social History: Married Never Smoked Regular exercise-no Alcohol use-no  Current Meds:  PROAIR HFA 108 (90 BASE) MCG/ACT  AERS (ALBUTEROL SULFATE) 2 puffs three times a day as needed ATENOLOL-CHLORTHALIDONE 50-25 MG TABS (ATENOLOL-CHLORTHALIDONE) 1/1/2 daily BUDEPRION XL 300 MG TB24 (BUPROPION HCL) Take 1 tablet by mouth once a day EFFEXOR XR 75 MG CP24 (VENLAFAXINE HCL) Take 1 capsule by mouth once a day LORAZEPAM 1 MG TABS (LORAZEPAM) prn NOVOLOG FLEXPEN 100 UNIT/ML SOLN (INSULIN ASPART) three times  a day as directed ONETOUCH ULTRA TEST  STRP (GLUCOSE BLOOD) tid POTASSIUM CHLORIDE CRYS CR 20 MEQ TBCR (POTASSIUM CHLORIDE CRYS CR) Take 1 tablet by mouth two times a day PREDNISONE 10 MG  TABS (PREDNISONE) 1 1/2 once daily ZEGERID 40-1100 MG CAPS (OMEPRAZOLE-SODIUM BICARBONATE) 2 by mouth daily CRESTOR 20 MG TABS (ROSUVASTATIN CALCIUM) 1 by mouth once daily QVAR 80 MCG/ACT  AERS (BECLOMETHASONE DIPROPIONATE) two puffs twice daily PEN NEEDLES 31G X 8 MM  MISC (INSULIN PEN NEEDLE) USE AS DIRECTED NEED F/U APPT B-4 ADDTIONAL REFILLS PREMARIN 0.3 MG TABS (ESTROGENS CONJUGATED) take two daily REMICADE 100 MG  SOLR (INFLIXIMAB) Injection every 6 weeks FLUTICASONE PROPIONATE 50 MCG/ACT  SUSP (FLUTICASONE PROPIONATE) 2 sprays each nostril once daily TUSSIONEX PENNKINETIC ER 8-10 MG/5ML LQCR (CHLORPHENIRAMINE-HYDROCODONE) 1 tsp two times a day as needed FUROSEMIDE 20 MG TABS (FUROSEMIDE) Take one (1) by mouth daily VICODIN 5-500 MG  TABS (HYDROCODONE-ACETAMINOPHEN) One tab every 6 hours as needed AMLODIPINE BESYLATE 10 MG  TABS (AMLODIPINE BESYLATE) Take 1 tablet by mouth once a day      Current Allergies (reviewed today): ! METHOTREXATE  Past Medical History:    Anemia-NOS    Diabetes mellitus, type II    GERD    Hyperlipidemia    Hypertension    pulmonary nodule    OSA    Vocal Cord dysfunction with recurrent cough    Asthma vs Vocal cord dysfundtion    SARCOID         Review of Systems  no other complaints in a complete ROS    Physical Exam  General:     Well-developed,well-nourished,in no acute distress; alert,appropriate and cooperative throughout examination Head:     normocephalic and atraumatic.   Neck:     No deformities, masses, or tenderness noted. Chest Wall:     No deformities, masses, or tenderness noted. Genitalia:     no cva or suprapubic tenderness    Impression & Recommendations:  Problem # 1:  UTI (ICD-599.0) side effects discussed unclea  if related to sarcoid treatment Orders: UA Dipstick w/o Micro (automated)  (81003) T-Culture, Urine ([address]-60454)  Her updated medication list for this problem includes:    Septra Ds 800-160 Mg Tabs (Sulfamethoxazole-trimethoprim) .[name]... 1 by mouth twice daily   Complete Medication List: 1)  Proair Hfa 108 (90 Base) Mcg/act Aers (Albuterol sulfate) .... 2 puffs three times a day as needed 2)  Atenolol-chlorthalidone 50-25 Mg Tabs (Atenolol-chlorthalidone) .... 1/1/2 daily 3)  Budeprion Xl 300 Mg Tb24 (Bupropion hcl) .... Take 1 tablet by mouth once a day 4)  Effexor Xr 75 Mg Cp24 (Venlafaxine hcl) .... Take 1 capsule by mouth once a day 5)  Lorazepam 1 Mg Tabs (Lorazepam) .... Prn 6)  Novolog Flexpen 100 Unit/ml Soln (Insulin aspart) .... Three times a day as directed 7)  Onetouch Ultra Test Strp (Glucose blood) .... Tid 8)  Potassium Chloride Crys Cr 20 Meq Tbcr (Potassium chloride crys cr) .... Take 1 tablet by mouth two times a day 9)  Prednisone 10 Mg Tabs (Prednisone) .[name].. 1 1/2 once daily 10)  Zegerid 40-1100 Mg Caps (Omeprazole-sodium bicarbonate) .... 2 by mouth daily 11)  Crestor 20 Mg Tabs (Rosuvastatin calcium) .[name].. 1 by mouth once daily 12)  Qvar 80 Mcg/act Aers (Beclomethasone dipropionate) .... Two puffs twice daily 13)  Pen Needles 31g X 8 Mm Misc (Insulin pen needle) .... Use as directed need f/u appt b-4 addtional refills 14)  Premarin 0.3 Mg Tabs (Estrogens conjugated) .... Take two daily 15)  Remicade 100 Mg Solr (Infliximab) .... Injection every 6 weeks 16)  Fluticasone Propionate 50 Mcg/act Susp (Fluticasone propionate) .... 2 sprays each nostril once daily 17)  Tussionex Pennkinetic Er 8-10 Mg/66ml Lqcr (Chlorpheniramine-hydrocodone) .[name].. 1 tsp two times a day as needed 18)  Furosemide 20 Mg Tabs (Furosemide) .... Take one (1) by mouth daily 19)  Vicodin 5-500 Mg Tabs (Hydrocodone-acetaminophen) .... One tab every 6 hours as needed 20)  Amlodipine Besylate 10 Mg Tabs  (Amlodipine besylate) .... Take 1 tablet by mouth once a day 21)  Septra Ds 800-160 Mg Tabs (Sulfamethoxazole-trimethoprim) .[name].. 1 by mouth twice daily    Prescriptions: SEPTRA DS 800-160 MG TABS (SULFAMETHOXAZOLE-TRIMETHOPRIM) 1 by mouth twice daily  #10 x 0   Entered and Authorized by:   Birdie Sons MD   Signed by:   Nira Conn on 03/27/2008   Method used:   Electronically sent to ...       [facility]       [address]       [place], [place]  [address]       Ph: [phone]       Fax: [phone]   RxID:   [id number]  ] Laboratory Results   Urine Tests    Routine Urinalysis   Color: yellow Appearance: Clear Glucose: trace   (Normal Range: Negative) Bilirubin: negative   (Normal Range: Negative) Ketone: negative   (Normal Range: Negative)  Spec. Gravity: 1.010   (Normal Range: 1.003-1.035) Blood: 3+   (Normal Range: Negative) pH: 5.5   (Normal Range: 5.0-8.0) Protein: negative   (Normal Range: Negative) Urobilinogen: negative   (Normal Range: 0-1) Nitrite: negative   (Normal Range: Negative) Leukocyte Esterace: 3+   (Normal Range: Negative)    Comments: Milica Zimonjic  March 27, 2008 5:01 PM     Appended Document: hematuria/dm

## 2010-11-12 NOTE — Procedures (Signed)
Summary: Endoscopy   EGD  Procedure date:  06/03/2009  Findings:      Location: [facility]   ENDOSCOPY PROCEDURE REPORT  PATIENT:  [name], [name]  MR#:  [id number] BIRTHDATE:   [birth year], 47 yrs. old   GENDER:   female  ENDOSCOPIST:   Wilhemina Bonito. Eda Keys, MD Referred by: Office   PROCEDURE DATE:  06/03/2009 PROCEDURE:  EGD with biopsy ASA CLASS:   Class III INDICATIONS: GERD, dysphagia   MEDICATIONS:    Fentanyl 75 mcg IV, Versed 9 mg IV TOPICAL ANESTHETIC:   Exactacain Spray  DESCRIPTION OF PROCEDURE:   After the risks benefits and alternatives of the procedure were thoroughly explained, informed consent was obtained.  The LB [id number] [id number] endoscope was introduced through the mouth and advanced to the second portion of the duodenum, without limitations.  The instrument was slowly withdrawn as the mucosa was fully examined. <<PROCEDUREIMAGES>>            <<OLD IMAGES>>  The esophagus and gastroesophageal junction were completely normal in appearance. No Candida esophagitis or reflux esophagitis. S/P fundoplication with intact wrap.  There were multiple small fundic gland type polyps identified in the body of the stomach. Several were biopsied. Otherwise the examination was normal.    Retroflexed views revealed Retroflexion exam demonstrated intact wrap.    The scope was then withdrawn from the patient and the procedure completed.  COMPLICATIONS:   None  ENDOSCOPIC IMPRESSION:  1) Normal esophagus  2) S/p fundoplication with intact wrap  3) Polyps, multiple small benign in the body of the stomach -BX  4) Otherwise normal examination 5) NO CONVINCING EVIDENCE AT THIS TIME THAT GERD IS RESPONSIBLE FOR ACUTE RECURRENT PULMONARY PROBLEMS   RECOMMENDATIONS:  1) continue current GI meds w/o change.    _______________________________ Wilhemina Bonito. Eda Keys, MD    CC: Lindley Magnus, MD, Storm Frisk, MD, The Patient       REPORT OF SURGICAL  PATHOLOGY   Case #: [id number] Patient Name: [name], [name]. Office Chart Number:  N/A [id number] MRN: [id number] Pathologist: H. Hollice Espy, MD DOB/Age  06-Jun-1961 (Age: 89)    Gender: F Date Taken:  06/03/2009 Date Received: 06/04/2009   FINAL DIAGNOSIS   ***MICROSCOPIC EXAMINATION AND DIAGNOSIS***   STOMACH, POLYP, BIOPSY:  FUNDIC GLAND POLYP   COMMENT A Warthin-Starry stain is performed to determine the possibility of the presence of Helicobacter pylori. The Warthin-Starry stain is negative for organisms of Helicobacter pylori. The control(s) stained appropriately.   gdt Date Reported:  06/05/2009     H. Hollice Espy, MD *** Electronically Signed Out By [facility] ***   June 05, 2009 MRN: [id number]    [facility] [address] [place], [place]  [address]    Dear [name],  I am pleased to inform you that the biopsies taken during your recent endoscopic examination revealed only a benign stomach polyp.   Additional information/recommendations:  __No further action is needed at this time.  Please follow-up with      your primary care physician for your other healthcare needs.     Please call us if you are having persistent problems or have questions about your condition that have not been fully answered at this time.  Sincerely,  Hilarie Fredrickson MD  This letter has been electronically signed by your physician.   Signed by Hilarie Fredrickson MD on 06/05/2009 at 3:03 PM   This report was created from the original endoscopy report,  which was reviewed and signed by the above listed endoscopist.

## 2010-11-12 NOTE — Assessment & Plan Note (Signed)
Summary: 6 wk rov/njr   Vital Signs:  Patient profile:   50 year old female Weight:      244.5 pounds Temp:     97.7 degrees F oral Pulse rate:   80 / minute Pulse rhythm:   regular Resp:     14 per minute BP sitting:   132 / 88  (left arm) Cuff size:   large  Vitals Entered By: Gladis Riffle, RN (January 20, 2010 11:14 AM) CC: 6 week rov--CBGs 125-130 at home--asking if should be on Vit D pills Is Patient Diabetic? Yes Did you bring your meter with you today? No Comments c/o lump right lower arm    Primary Care Provider:  Birdie Sons, MD   CC:  6 week rov--CBGs 125-130 at home--asking if should be on Vit D pills.  History of Present Illness:  Follow-Up Visit      [name] who presents for Follow-up visit.  The patient denies chest pain and palpitations.  Since the last visit the patient notes no new problems or concerns.  The patient reports taking meds as prescribed, monitoring blood sugars, and dietary compliance.  When questioned about possible medication side effects, the patient notes none.  she is losing weight with [employer].  All other systems reviewed and were negative   Preventive Screening-Counseling & Management  Alcohol-Tobacco     Smoking Status: never  Current Medications (verified): 1)  Atenolol-Chlorthalidone 50-25 Mg Tabs (Atenolol-Chlorthalidone) .... Take 1 Tablet By Mouth Once A Day 2)  Effexor Xr 75 Mg Cp24 (Venlafaxine Hcl) .... Take 1 Tablet By Mouth Once A Day 3)  Lorazepam 1 Mg Tabs (Lorazepam) .... One Every 6 Hrs As Needed 4)  Onetouch Ultra Test  Strp (Glucose Blood) .... Tid 5)  Potassium Chloride Crys Cr 20 Meq Tbcr (Potassium Chloride Crys Cr) .[name].. 1 Three Times A Day 6)  Zegerid 40-1100 Mg Caps (Omeprazole-Sodium Bicarbonate) .... Take 1 Tablet By Mouth Once A Day 7)  Pen Needles 31g X 8 Mm  Misc (Insulin Pen Needle) .... Use As Directed 8)  Remicade 100 Mg  Solr (Infliximab) .... Injection Every 6 Weeks 9)   Fluticasone Propionate 50 Mcg/act  Susp (Fluticasone Propionate) .... 2 Sprays Each Nostril Once Daily 10)  Furosemide 20 Mg Tabs (Furosemide) .... Take One (1) By Mouth Daily 11)  Vicodin 5-500 Mg  Tabs (Hydrocodone-Acetaminophen) .... One Tab Every 6 Hours As Needed 12)  Estrace 1 Mg Tabs (Estradiol) .... Take 1 Tablet By Mouth Once A Day 13)  Restasis 0.05 % Emul (Cyclosporine) .[name].. 1 Drop Each Eye Daily 14)  Lotemax 0.5 % Susp (Loteprednol Etabonate) .[name].. 1 Drop Each Eye Every 6 Hours As Needed 15)  Tussionex Pennkinetic Er 8-10 Mg/52ml Lqcr (Chlorpheniramine-Hydrocodone) .[name].. 1 Tsp Every 12 Hr As Needed For Cough, May Make You Sleepy 16)  Simvastatin 40 Mg Tabs (Simvastatin) .... Take One Tablet At Bedtime Start One Week After Completing Diflucan 17)  Magnesium Oxide 250 Mg Tabs (Magnesium Oxide) .... One Tablet By Mouth Once Daily 18)  Levbid 0.375 Mg Xr12h-Tab (Hyoscyamine Sulfate) .[name].. 1 By Mouth Two Times A Day As Needed 19)  Prednisone 5 Mg Tabs (Prednisone) .... Take 1 Tablet By Mouth Every Other Day 20)  Cpap 7 Cm 21)  Diclofenac Sodium 75 Mg Tbec (Diclofenac Sodium) .[name].. 1 By Mouth Two Times A Day After Meals As Needed  Allergies: 1)  ! Methotrexate  Past History:  Past Medical History: Last  updated: 10/13/2009 EDEMA (ICD-782.3) HEADACHE (ICD-784.0) MENOPAUSAL SYNDROME (ICD-627.2) UTI (ICD-599.0) SARCOIDOSIS (ICD-135)   -CT Chest 6/08 improvement in Mediastinal LAN   -11/08 start of remecade Rx per [facility] PULMONARY NODULE (ICD-518.89)    -CT Chest 6/08 reduction in size of nodules due to Arava and Pred RX     -probable granulomas due to Sarcoidosis HYPERTENSION (ICD-401.9) HYPERLIPIDEMIA (ICD-272.4) GERD (ICD-530.81) Sleep APnea DIABETES MELLITUS, TYPE II (ICD-250.00) ANEMIA-NOS (ICD-285.9)  Fundic Gland Polyp  Past Surgical History: Last updated: 08/21/2006 Ventral Hernia Repair Nissen Fundoplication Cholecystectomy Hysterectomy  Family  History: Last updated: 10/20/2009 father-alive MI, DM mother alive and well uncle with SARCOID No FH of Colon Cancer: Family History Emphysema-Mother, Father  Family History Asthma-Asthma Family History COPD -mother  Social History: Last updated: 10/20/2009 Married, live with husband Never Smoked Regular exercise-no Alcohol use-no Illicit Drug Use - no  Risk Factors: Exercise: no (05/31/2007)  Risk Factors: Smoking Status: never (01/20/2010)  Physical Exam  General:  Well-developed,well-nourished,in no acute distress; alert,appropriate and cooperative throughout examinationoverweight-appearing.   Head:  normocephalic and atraumatic.   Eyes:  pupils equal and pupils round.   Ears:  R ear normal and L ear normal.   Abdomen:  soft and non-tender.   Msk:  No deformity or scoliosis noted of thoracic or lumbar spine.   Neurologic:  cranial nerves II-XII intact and gait normal.    Diabetes Management Exam:    Eye Exam:       Eye Exam done elsewhere          Date: 10/11/2009          Results: normalpt's report          Done by: ophthalm   Impression & Recommendations:  Problem # 1:  DIABETES MELLITUS, TYPE II (ICD-250.00) significant improvement given weight loss.  We'll continue to follow. Labs Reviewed: Creat: 0.7 (12/02/2009)     Last Eye Exam: normalpt's report (10/11/2009) Reviewed HgBA1c results: 6.6 (12/02/2009)  7.1 (09/02/2009)  Problem # 2:  ANEMIA-UNSPECIFIED (ICD-285.9) result. Hgb: 12.8 (09/18/2009)   Hct: 37.4 (09/18/2009)   Platelets: 310.0 (09/18/2009) RBC: 3.90 (09/18/2009)   RDW: 12.5 (09/18/2009)   WBC: 16.5 (09/18/2009) MCV: 95.8 (09/18/2009)   MCHC: 34.2 (09/18/2009)  Problem # 3:  SARCOIDOSIS (ICD-135) followed by Dr. Delford Field.  Complete Medication List: 1)  Atenolol-chlorthalidone 50-25 Mg Tabs (Atenolol-chlorthalidone) .... Take 1 tablet by mouth once a day 2)  Effexor Xr 75 Mg Cp24 (Venlafaxine hcl) .... Take 1 tablet by mouth once a  day 3)  Lorazepam 1 Mg Tabs (Lorazepam) .... One every 6 hrs as needed 4)  Potassium Chloride Crys Cr 20 Meq Tbcr (Potassium chloride crys cr) .[name].. 1 three times a day 5)  Zegerid 40-1100 Mg Caps (Omeprazole-sodium bicarbonate) .... Take 1 tablet by mouth once a day 6)  Remicade 100 Mg Solr (Infliximab) .... Injection every 6 weeks 7)  Fluticasone Propionate 50 Mcg/act Susp (Fluticasone propionate) .... 2 sprays each nostril once daily 8)  Furosemide 20 Mg Tabs (Furosemide) .... Take one (1) by mouth daily 9)  Vicodin 5-500 Mg Tabs (Hydrocodone-acetaminophen) .... One tab every 6 hours as needed 10)  Estrace 1 Mg Tabs (Estradiol) .... Take 1 tablet by mouth once a day 11)  Restasis 0.05 % Emul (Cyclosporine) .[name].. 1 drop each eye daily 12)  Lotemax 0.5 % Susp (Loteprednol etabonate) .[name].. 1 drop each eye every 6 hours as needed 13)  Simvastatin 40 Mg Tabs (Simvastatin) .... Take one tablet at  bedtime start one week after completing diflucan 14)  Levbid 0.375 Mg Xr12h-tab (Hyoscyamine sulfate) .[name].. 1 by mouth two times a day as needed 15)  Prednisone 5 Mg Tabs (Prednisone) .... Take 1 tablet by mouth every other day 16)  Cpap 7 Cm  17)  Nystatin-triamcinolone 100000-0.1 Unit/gm-% Crea (Nystatin-triamcinolone) .... Apply two times a day to affected area  Patient Instructions: 1)  6 weeks  Prevention & Chronic Care Immunizations   Influenza vaccine: Fluvax 3+  (07/23/2009)   Influenza vaccine due: 06/26/2009    Tetanus booster: Not documented    Pneumococcal vaccine: Pneumovax  (09/30/2008)  Colorectal Screening   Hemoccult: Not documented    Colonoscopy: Location:  [facility].  Results: Normal.   (02/19/2005)   Colonoscopy due: 02/2010  Other Screening   Pap smear: Not documented    Mammogram: Not documented   Smoking status: never  (01/20/2010)  Diabetes Mellitus   HgbA1C: 6.6  (12/02/2009)   Hemoglobin A1C due: 09/05/2008    Eye exam: normalpt's report   (10/11/2009)   Eye exam due: 10/2010    Foot exam: Not documented   High risk foot: Not documented   Foot care education: Not documented    Urine microalbumin/creatinine ratio: 2.2  (05/24/2007)   Urine microalbumin/cr due: 05/23/2008  Lipids   Total Cholesterol: 171  (12/02/2009)   LDL: DEL  (09/30/2008)   LDL Direct: 107.7  (12/02/2009)   HDL: 44.10  (12/02/2009)   Triglycerides: 314.0  (12/02/2009)    SGOT (AST): 19  (12/02/2009)   SGPT (ALT): 16  (12/02/2009)   Alkaline phosphatase: 85  (12/02/2009)   Total bilirubin: 0.5  (12/02/2009)  Hypertension   Last Blood Pressure: 132 / 88  (01/20/2010)   Serum creatinine: 0.7  (12/02/2009)   Serum potassium 3.4  (12/02/2009)  Self-Management Support :    Diabetes self-management support: Not documented    Hypertension self-management support: Not documented    Lipid self-management support: Not documented

## 2010-11-12 NOTE — Progress Notes (Signed)
Summary: chest discomfort continues  Phone Note Call from Patient Call back at [phone] or [phone]   Caller: Spouse Call For: Dr Cato Mulligan Summary of Call: pt was seen on 9/24  was told to give a call back if still  having chest discomfort. pls call pt  Initial call taken by: Shan Levans,  July 10, 2007 11:36 AM  Follow-up for Phone Call        Pt was called back and she reports she has been taking two of the Prednisone 10 mg as instructed on 9/24 with no improvement.  Afibrile, but c/o feeling  'tired and achy all over".  [facility] Follow-up by: Sid Falcon LPN,  July 10, 2007 12:38 PM  Additional Follow-up for Phone Call Additional follow up Details #1::        decrease prednisone to 10 mg by mouth once daily.  trial etodolac 300 mg by mouth two times a day for 14 days Additional Follow-up by: Birdie Sons MD,  July 10, 2007 1:00 PM    Additional Follow-up for Phone Call Additional follow up Details #2::    Rx called in, pt informed to decrease prednisone to 10 mg one time daily.  Pt voiced her understanding. Follow-up by: Sid Falcon LPN,  July 10, 2007 4:19 PM

## 2010-11-12 NOTE — Progress Notes (Signed)
Summary: mouth ulcers  Phone Note Call from Patient   Caller: Patient Call For: Birdie Sons MD Summary of Call: Mouth is better, but is still having pain when eats or brushes teeth.  Is wondering what she should do next?  Wants to know what it is, and if it is dental related? [phone] Initial call taken by: Lynann Beaver CMA,  Feb 23, 2010 8:34 AM  Follow-up for Phone Call        doubt dental related---refer to ENT Follow-up by: Birdie Sons MD,  Feb 23, 2010 12:52 PM  Additional Follow-up for Phone Call Additional follow up Details #1::        Patient aware & says okay.  Does not have an ENT.   Additional Follow-up by: Rudy Jew, RN,  Feb 23, 2010 12:57 PM  New Problems: OTHER ACUTE PAIN (ICD-338.19)   New Problems: OTHER ACUTE PAIN (ICD-338.19)

## 2010-11-12 NOTE — Assessment & Plan Note (Signed)
Summary: ABD PAIN /MM   Vital Signs:  Patient profile:   50 year old female Weight:      279 pounds Temp:     97.7 degrees F oral BP sitting:   110 / 80  (left arm) Cuff size:   large  Vitals Entered By: Kern Reap CMA Duncan Dull) (October 28, 2009 9:39 AM)  Reason for Visit abd pain, nausea, HA  Primary Care Provider:  Birdie Sons, MD    History of Present Illness: [name] is a 50 year old, married female, nonsmoker, who comes in today for evaluation of abdominal pain, nausea, vomiting, and diarrhea for 3 days.  The GI but has been going through their household.  She developed nausea, vomiting, and diarrhea starting Sunday.  However, last night she noticed her abdomen became bloated she's been up all night with abdominal pain, unable to rest.  She's had two major abdominal surgeries in the past one was a hiatal hernia surgery.  A second was a gallbladder operation.    Allergies: 1)  ! Methotrexate  Past History:  Past medical, surgical, family and social histories (including risk factors) reviewed, and no changes noted (except as noted below).  Past Medical History: Reviewed history from 10/13/2009 and no changes required. EDEMA (ICD-782.3) HEADACHE (ICD-784.0) MENOPAUSAL SYNDROME (ICD-627.2) UTI (ICD-599.0) SARCOIDOSIS (ICD-135)   -CT Chest 6/08 improvement in Mediastinal LAN   -11/08 start of remecade Rx per [facility] PULMONARY NODULE (ICD-518.89)    -CT Chest 6/08 reduction in size of nodules due to Arava and Pred RX     -probable granulomas due to Sarcoidosis HYPERTENSION (ICD-401.9) HYPERLIPIDEMIA (ICD-272.4) GERD (ICD-530.81) Sleep APnea DIABETES MELLITUS, TYPE II (ICD-250.00) ANEMIA-NOS (ICD-285.9)  Fundic Gland Polyp  Past Surgical History: Reviewed history from 08/21/2006 and no changes required. Ventral Hernia Repair Nissen Fundoplication Cholecystectomy Hysterectomy  Family History: Reviewed history from 10/20/2009 and no changes  required. father-alive MI, DM mother alive and well uncle with SARCOID No FH of Colon Cancer: Family History Emphysema-Mother, Father  Family History Asthma-Asthma Family History COPD -mother  Social History: Reviewed history from 10/20/2009 and no changes required. Married, live with husband Never Smoked Regular exercise-no Alcohol use-no Illicit Drug Use - no  Review of Systems      See HPI  Physical Exam  General:  Well-developed,well-nourished,in no acute distress; alert,appropriate and cooperative throughout examination Abdomen:  the abdomen is markedly bloated.  There is high-pitched tinkling bowel sounds.  Diffuse abdominal pain.  No rebound   Impression & Recommendations:  Problem # 1:  ABDOMINAL PAIN -GENERALIZED (ICD-789.07) Assessment Comment Only  Her updated medication list for this problem includes:    Remicade 100 Mg Solr (Infliximab) ..... Injection every 6 weeks  Complete Medication List: 1)  Atenolol-chlorthalidone 50-25 Mg Tabs (Atenolol-chlorthalidone) .... Take 1 tablet by mouth once a day 2)  Effexor Xr 75 Mg Cp24 (Venlafaxine hcl) .... Take 1 tablet by mouth once a day 3)  Lorazepam 1 Mg Tabs (Lorazepam) .... One every 6 hrs as needed 4)  Novolog Flexpen 100 Unit/ml Soln (Insulin aspart) .... Three times a day as directed 5)  Onetouch Ultra Test Strp (Glucose blood) .... Tid 6)  Potassium Chloride Crys Cr 20 Meq Tbcr (Potassium chloride crys cr) .[name].. 1 three times a day 7)  Prednisone 10 Mg Tabs (Prednisone) .... Take as directed. 8)  Zegerid 40-1100 Mg Caps (Omeprazole-sodium bicarbonate) .... Take 1 tablet by mouth once a day 9)  Pen Needles 31g X 8 Mm Misc (Insulin pen  needle) .... Use as directed 10)  Remicade 100 Mg Solr (Infliximab) .... Injection every 6 weeks 11)  Fluticasone Propionate 50 Mcg/act Susp (Fluticasone propionate) .... 2 sprays each nostril once daily 12)  Furosemide 20 Mg Tabs (Furosemide) .... Take one (1) by mouth daily 13)   Vicodin 5-500 Mg Tabs (Hydrocodone-acetaminophen) .... One tab every 6 hours as needed 14)  Estrace 1 Mg Tabs (Estradiol) .... Take 1 tablet by mouth once a day 15)  Restasis 0.05 % Emul (Cyclosporine) .[name].. 1 drop each eye daily 16)  Lotemax 0.5 % Susp (Loteprednol etabonate) .[name].. 1 drop each eye every 6 hours as needed 17)  Tussionex Pennkinetic Er 8-10 Mg/34ml Lqcr (Chlorpheniramine-hydrocodone) .[name].. 1 tsp every 12 hr as needed for cough, may make you sleepy 18)  Simvastatin 40 Mg Tabs (Simvastatin) .... Take one tablet at bedtime start one week after completing diflucan 19)  Magnesium Oxide 250 Mg Tabs (Magnesium oxide) .... One tablet by mouth once daily 20)  Levbid 0.375 Mg Xr12h-tab (Hyoscyamine sulfate) .[name].. 1 by mouth two times a day as needed  Patient Instructions: 1)  go directly to [facility] long emergency room for evaluation now

## 2010-11-12 NOTE — Assessment & Plan Note (Signed)
Summary: cough, congestion   Vital Signs:  Patient profile:   50 year old female O2 Sat:      97 % Temp:     98.2 degrees F Pulse rate:   100 / minute Resp:     20 per minute BP sitting:   132 / 78  (left arm) Cuff size:   large  Vitals Entered By: Gladis Riffle, RN (December 31, 2008 11:12 AM)  Primary Care Provider:  Dr. Birdie Sons  CC:  URI symptoms.  History of Present Illness:  URI Symptoms      [name] who presents with URI symptoms.  The symptoms began 5 days ago.  The severity is described as moderate.  The patient reports sore throat and dry cough, but denies nasal congestion, clear nasal discharge, purulent nasal discharge, productive cough, and sick contacts.  The patient denies fever, stiff neck, and dyspnea.  The patient also reports itchy throat.  The patient denies the following risk factors for Strep sinusitis: unilateral facial pain, tooth pain, and Strep exposure.    Past Medical History: EDEMA (ICD-782.3) HEADACHE (ICD-784.0) MENOPAUSAL SYNDROME (ICD-627.2) UTI (ICD-599.0) SARCOIDOSIS (ICD-135)   -CT Chest 6/08 improvement in Mediastinal LAN   -11/08 start of remecade Rx per [facility] PULMONARY NODULE (ICD-518.89)    -CT Chest 6/08 reduction in size of nodules due to Arava and Pred RX     -probable granulomas due to Sarcoidosis HYPERTENSION (ICD-401.9) HYPERLIPIDEMIA (ICD-272.4) GERD (ICD-530.81) DIABETES MELLITUS, TYPE II (ICD-250.00) ANEMIA-NOS (ICD-285.9)    Past Surgical History: Ventral Hernia Repair Nissen Fundoplication Cholecystectomy Hysterectomy  Social History: Married Never Smoked Regular exercise-no Alcohol use-no  Family History: father-alive MI, DM mother alive and well uncle with SARCOID no other complaints in a complete ROS   Medications Prior to Update: 1)  Proair Hfa 108 (90 Base) Mcg/act  Aers (Albuterol Sulfate) .... 2 Puffs Three Times A Day As Needed 2)  Atenolol-Chlorthalidone 50-25 Mg  Tabs (Atenolol-Chlorthalidone) .... Take 1 Tablet By Mouth Once A Day 3)  Budeprion Xl 300 Mg Tb24 (Bupropion Hcl) .... Take 1 Tablet By Mouth Once A Day 4)  Effexor Xr 75 Mg Cp24 (Venlafaxine Hcl) .... Take 1 Capsule By Mouth Once A Day 5)  Lorazepam 1 Mg Tabs (Lorazepam) .... Prn 6)  Novolog Flexpen 100 Unit/ml Soln (Insulin Aspart) .... Three Times A Day As Directed 7)  Onetouch Ultra Test  Strp (Glucose Blood) .... Tid 8)  Potassium Chloride Crys Cr 20 Meq Tbcr (Potassium Chloride Crys Cr) .... Take 1 Tablet By Mouth Two Times A Day 9)  Prednisone 5 Mg Tabs (Prednisone) .... Take 1 Tablet By Mouth Once A Day 10)  Zegerid 40-1100 Mg Caps (Omeprazole-Sodium Bicarbonate) .... 2 By Mouth Daily 11)  Crestor 20 Mg Tabs (Rosuvastatin Calcium) .[name].. 1 By Mouth Once Daily 12)  Qvar 80 Mcg/act  Aers (Beclomethasone Dipropionate) .... Two Puffs Twice Daily 13)  Pen Needles 31g X 8 Mm  Misc (Insulin Pen Needle) .... Use As Directed 14)  Remicade 100 Mg  Solr (Infliximab) .... Injection Every 6 Weeks 15)  Fluticasone Propionate 50 Mcg/act  Susp (Fluticasone Propionate) .... 2 Sprays Each Nostril Once Daily 16)  Furosemide 20 Mg Tabs (Furosemide) .... Take One (1) By Mouth Daily 17)  Vicodin 5-500 Mg  Tabs (Hydrocodone-Acetaminophen) .... One Tab Every 6 Hours As Needed 18)  Vitamin D 1000 Unit  Caps (Cholecalciferol) .... Two Times A Day 19)  Benicar 40  Mg  Tabs (Olmesartan Medoxomil) .[name].. 1 By Mouth Once Daily 20)  Estrace 1 Mg Tabs (Estradiol) .... Take 1 Tablet By Mouth Once A Day 21)  Slow Release Iron 47.5 Mg Cr-Tabs (Ferrous Sulfate) .... Once Daily 22)  Multiple Vitamin  Tabs (Multiple Vitamin) .... Once Daily 23)  Tussionex Pennkinetic Er 8-10 Mg/37ml Lqcr (Chlorpheniramine-Hydrocodone) .... Take 1 Teaspoon Every 12 Hrs As Needed Cough 24)  Restasis 0.05 % Emul (Cyclosporine) .[name].. 1 Drop Each Eye Daily 25)  Fish Oil 500 Mg Caps (Omega-3 Fatty Acids) .... Once Daily 26)  Lotemax 0.5 % Susp  (Loteprednol Etabonate) .[name].. 1 Drop Each Eye Every 6 Hours As Needed 27)  Utac 500-500 Mg Tabs (Methenamine Mand-Sod Phosphate) .... Two Times A Day 28)  Xyzal 5 Mg  Tabs (Levocetirizine Dihydrochloride) .... By Mouth Daily  Allergies (verified): 1)  ! Methotrexate  Comments:  Nurse/Medical Assistant: c/o cough, chest congestion, sore throat--no relief with cough syrup The patient's medications and allergies were reviewed with the patient and were updated in the Medication and Allergy Lists. Gladis Riffle, RN (December 31, 2008 11:11 AM)  Physical Exam  General:  nad, obese Head:  normocephalic and atraumatic Eyes:  pupils equal and pupils round.   Ears:  R ear normal and L ear normal.   Nose:  no external deformity and no external erythema.   Neck:  no masses, thyromegaly, or abnormal cervical nodes Chest Wall:  No deformities, masses, or tenderness noted. Lungs:  normal respiratory effort and no intercostal retractions.   Heart:  normal rate and regular rhythm.   Abdomen:  soft and non-tender.  obese Msk:  No deformity or scoliosis noted of thoracic or lumbar spine.   Pulses:  R radial normal and L radial normal.   Extremities:  trace left pedal edema and trace right pedal edema.   Skin:  turgor normal and color normal.   Psych:  memory intact for recent and remote and normally interactive.     Impression & Recommendations:  Problem # 1:  COUGH (ICD-786.2) suspect viral URI no evidence of bacterial infection. call for any concerns, increased sxs, fever, persistence of sxs, wheeze, SOB.  maxifed g two times a day (samples given, side effects reviewed)---10 days  Complete Medication List: 1)  Proair Hfa 108 (90 Base) Mcg/act Aers (Albuterol sulfate) .... 2 puffs three times a day as needed 2)  Atenolol-chlorthalidone 50-25 Mg Tabs (Atenolol-chlorthalidone) .... Take 1 tablet by mouth once a day 3)  Budeprion Xl 300 Mg Tb24 (Bupropion hcl) .... Take 1 tablet by mouth once a  day 4)  Effexor Xr 75 Mg Cp24 (Venlafaxine hcl) .... Take 1 capsule by mouth once a day 5)  Lorazepam 1 Mg Tabs (Lorazepam) .... Prn 6)  Novolog Flexpen 100 Unit/ml Soln (Insulin aspart) .... Three times a day as directed 7)  Onetouch Ultra Test Strp (Glucose blood) .... Tid 8)  Potassium Chloride Crys Cr 20 Meq Tbcr (Potassium chloride crys cr) .... Take 1 tablet by mouth two times a day 9)  Prednisone 5 Mg Tabs (Prednisone) .... Take 1 tablet by mouth once a day 10)  Zegerid 40-1100 Mg Caps (Omeprazole-sodium bicarbonate) .... 2 by mouth daily 11)  Crestor 20 Mg Tabs (Rosuvastatin calcium) .[name].. 1 by mouth once daily 12)  Qvar 80 Mcg/act Aers (Beclomethasone dipropionate) .... Two puffs twice daily 13)  Pen Needles 31g X 8 Mm Misc (Insulin pen needle) .... Use as directed 14)  Remicade 100 Mg Solr (Infliximab) .Marland KitchenMarland KitchenMarland Kitchen  Injection every 6 weeks 15)  Fluticasone Propionate 50 Mcg/act Susp (Fluticasone propionate) .... 2 sprays each nostril once daily 16)  Furosemide 20 Mg Tabs (Furosemide) .... Take one (1) by mouth daily 17)  Vicodin 5-500 Mg Tabs (Hydrocodone-acetaminophen) .... One tab every 6 hours as needed 18)  Vitamin D 1000 Unit Caps (Cholecalciferol) .... Two times a day 19)  Benicar 40 Mg Tabs (Olmesartan medoxomil) .[name].. 1 by mouth once daily 20)  Estrace 1 Mg Tabs (Estradiol) .... Take 1 tablet by mouth once a day 21)  Slow Release Iron 47.5 Mg Cr-tabs (Ferrous sulfate) .... Once daily 22)  Multiple Vitamin Tabs (Multiple vitamin) .... Once daily 23)  Tussionex Pennkinetic Er 8-10 Mg/31ml Lqcr (Chlorpheniramine-hydrocodone) .... Take 1 teaspoon every 12 hrs as needed cough 24)  Restasis 0.05 % Emul (Cyclosporine) .[name].. 1 drop each eye daily 25)  Fish Oil 500 Mg Caps (Omega-3 fatty acids) .... Once daily 26)  Lotemax 0.5 % Susp (Loteprednol etabonate) .[name].. 1 drop each eye every 6 hours as needed 27)  Utac 500-500 Mg Tabs (Methenamine mand-sod phosphate) .... Two times a day 28)  Xyzal 5 Mg  Tabs (Levocetirizine dihydrochloride) .... By mouth daily

## 2010-11-12 NOTE — Letter (Signed)
Summary: [facility]   Imported By: Maryln Gottron 09/24/2010 15:53:56  _____________________________________________________________________  External Attachment:    Type:   Image     Comment:   External Document

## 2010-11-12 NOTE — Assessment & Plan Note (Signed)
Summary: 5 wk rov/njr   Vital Signs:  Patient Profile:   50 Years Old Female Height:     66 inches Weight:      290 pounds Temp:     98.3 degrees F oral Pulse rate:   64 / minute Pulse rhythm:   regular BP sitting:   110 / 80  (left arm) Cuff size:   large  Vitals Entered By: Kern Reap CMA (June 05, 2008 10:55 AM)               Vision Comments: 07/2009   Referred by:  Chase Picket PCP:  Dr. Birdie Sons  Chief Complaint:  follow up office visit.  History of Present Illness:  Follow-Up Visit      [name] who presents for Follow-up visit.  The patient denies chest pain, palpitations, dizziness, syncope, low blood sugar symptoms, high blood sugar symptoms, edema, SOB, DOE, PND, and orthopnea.  Since the last visit the patient notes no new problems or concerns.  The patient reports taking meds as prescribed, not monitoring BP, monitoring blood sugars, and dietary noncompliance.  When questioned about possible medication side effects, the patient notes none.  HOME CBGS : 60-200, she can feel hypoglycemia (maybe two times weekly)  Past Medical History: Anemia-NOS Diabetes mellitus, type II GERD Hyperlipidemia Hypertension pulmonary nodule OSA Vocal Cord dysfunction with recurrent cough Asthma vs Vocal cord dysfundtion SARCOID   Past Surgical History: Ventral Hernia Repair Nissen Fundoplication Cholecystectomy Hysterectomy  Social History: Married Never Smoked Regular exercise-no Alcohol use-no  Family History: father-alive MI, DM mother alive and well uncle with SARCOID no other complaints in a complete ROS   Diabetes Management History:      She is (or has been) enrolled in the "Diabetic Education Program".  She says that she is not exercising regularly.       Current Allergies (reviewed today): ! METHOTREXATE      Physical Exam  General:     Well-developed,well-nourished,in no acute distress; alert,appropriate and  cooperative throughout examination Head:     normocephalic and no abnormalities observed.   Eyes:     pupils equal and pupils round.   Ears:     R ear normal and L ear normal.   Neck:     No deformities, masses, or tenderness noted. Chest Wall:     No deformities, masses, or tenderness noted. Lungs:     Normal respiratory effort, chest expands symmetrically. Lungs are clear to auscultation, no crackles or wheezes. Heart:     Normal rate and regular rhythm. S1 and S2 normal without gallop, murmur, click, rub or other extra sounds. Abdomen:     Bowel sounds positive,abdomen soft and non-tender without masses, organomegaly or hernias noted. morbidly obese Msk:     No deformity or scoliosis noted of thoracic or lumbar spine.   Pulses:     R and L carotid,radial,femoral,dorsalis pedis and posterior tibial pulses are full and equal bilaterally Extremities:     No clubbing, cyanosis, edema, or deformity noted  Neurologic:     cranial nerves II-XII intact and gait normal.   Skin:     turgor normal and color normal.   Psych:     normally interactive and good eye contact.    Diabetes Management Exam:    Eye Exam:       Eye Exam done elsewhere          Date: 07/11/2008  Results: normal-pt's report          Done by: ophthalmologist    Impression & Recommendations:  Problem # 1:  SARCOIDOSIS (ICD-135) f/u rheumatology and pulmonary  Problem # 2:  HYPERLIPIDEMIA (ICD-272.4) continue current meds Her updated medication list for this problem includes:    Crestor 20 Mg Tabs (Rosuvastatin calcium) .[name]... 1 by mouth once daily  Labs Reviewed: Chol: 170 (01/25/2008)   HDL: 45.5 (01/25/2008)   LDL: 85 (01/25/2008)   TG: 199 (01/25/2008) SGOT: 22 (01/25/2008)   SGPT: 24 (01/25/2008)  Prior 10 Yr Risk Heart Disease: 8 % (09/21/2006)   Problem # 3:  DIABETES MELLITUS, TYPE II (ICD-250.00) adequate control continue current medications  Her updated medication list for this  problem includes:    Novolog Flexpen 100 Unit/ml Soln (Insulin aspart) .[name]... Three times a day as directed    Benicar 40 Mg Tabs (Olmesartan medoxomil) .[name]... 1 by mouth once daily  Labs Reviewed: HgBA1c: 6.9 (01/25/2008)   Creat: 0.8 (02/28/2008)   Microalbumin: 0.3 (05/24/2007)  Last Eye Exam: normal-pt's report (07/11/2008)  Orders: TLB-A1C / Hgb A1C (Glycohemoglobin) (83036-A1C) TLB-BMP (Basic Metabolic Panel-BMET) (80048-METABOL) TLB-Lipid Panel (80061-LIPID) TLB-ALT (SGPT) (84460-ALT) TLB-AST (SGOT) (84450-SGOT)   Problem # 4:  ANEMIA-NOS (ICD-285.9) resolved Her updated medication list for this problem includes:    Slow Release Iron 47.5 Mg Cr-tabs (Ferrous sulfate) ..... Once daily Hgb: 13.1 (01/25/2008)   Hct: 38.6 (01/25/2008)   RDW: 12.4 (01/25/2008)   MCV: 93.4 (01/25/2008)   MCHC: 33.8 (01/25/2008)      Complete Medication List: 1)  Proair Hfa 108 (90 Base) Mcg/act Aers (Albuterol sulfate) .... 2 puffs three times a day as needed 2)  Atenolol-chlorthalidone 50-25 Mg Tabs (Atenolol-chlorthalidone) .... 1/1/2 daily 3)  Budeprion Xl 300 Mg Tb24 (Bupropion hcl) .... Take 1 tablet by mouth once a day 4)  Effexor Xr 75 Mg Cp24 (Venlafaxine hcl) .... Take 1 capsule by mouth once a day 5)  Lorazepam 1 Mg Tabs (Lorazepam) .... Prn 6)  Novolog Flexpen 100 Unit/ml Soln (Insulin aspart) .... Three times a day as directed 7)  Onetouch Ultra Test Strp (Glucose blood) .... Tid 8)  Potassium Chloride Crys Cr 20 Meq Tbcr (Potassium chloride crys cr) .... Take 1 tablet by mouth two times a day 9)  Prednisone 10 Mg Tabs (Prednisone) .... Take 1 tablet by mouth once a day 10)  Zegerid 40-1100 Mg Caps (Omeprazole-sodium bicarbonate) .... 2 by mouth daily 11)  Crestor 20 Mg Tabs (Rosuvastatin calcium) .[name].. 1 by mouth once daily 12)  Qvar 80 Mcg/act Aers (Beclomethasone dipropionate) .... Two puffs twice daily 13)  Pen Needles 31g X 8 Mm Misc (Insulin pen needle) .... Use as  directed 14)  Remicade 100 Mg Solr (Infliximab) .... Injection every 6 weeks 15)  Fluticasone Propionate 50 Mcg/act Susp (Fluticasone propionate) .... 2 sprays each nostril once daily 16)  Furosemide 20 Mg Tabs (Furosemide) .... Take one (1) by mouth daily 17)  Vicodin 5-500 Mg Tabs (Hydrocodone-acetaminophen) .... One tab every 6 hours as needed 18)  Vitamin D 1000 Unit Caps (Cholecalciferol) .... Two times a day 19)  Benicar 40 Mg Tabs (Olmesartan medoxomil) .[name].. 1 by mouth once daily 20)  Premarin 0.625 Mg Tabs (Estrogens conjugated) .... Take one tab by mouth once daily 21)  Slow Release Iron 47.5 Mg Cr-tabs (Ferrous sulfate) .... Once daily 22)  Multiple Vitamin Tabs (Multiple vitamin) .... Once daily 23)  Tussionex Pennkinetic Er 8-10 Mg/79ml Lqcr (Chlorpheniramine-hydrocodone) .... As  needed  Diabetes Management Assessment/Plan:      The following lipid goals have been established for the patient: Total cholesterol goal of 200; LDL cholesterol goal of 100; HDL cholesterol goal of 40; Triglyceride goal of 200.      ]

## 2010-11-12 NOTE — Letter (Signed)
Summary: [facility]  [facility]   Imported By: Sherian Rein 08/06/2009 09:28:51  _____________________________________________________________________  External Attachment:    Type:   Image     Comment:   External Document

## 2010-11-12 NOTE — Assessment & Plan Note (Signed)
Summary: headaches-earaches//ccm   Vital Signs:  Patient profile:   50 year old female Weight:      249 pounds Temp:     97.8 degrees F oral Pulse rate:   68 / minute Pulse rhythm:   regular Resp:     14 per minute BP sitting:   114 / 72  (left arm) Cuff size:   large  Vitals Entered By: Gladis Riffle, RN (January 13, 2010 10:07 AM) CC: c/o headaches at sinuses with bloody discharge x 2 days, also sharp pain at times at back of head with bilateral earaches Is Patient Diabetic? Yes Comments also c/o flank pain x 3 days with frequency   Primary Care Provider:  Birdie Sons, MD   CC:  c/o headaches at sinuses with bloody discharge x 2 days and also sharp pain at times at back of head with bilateral earaches.  History of Present Illness: 5 day hx of sinus congestion and bilateral ear pain 4 days ago with epistaxis---resolved no fever or chills complicated patient: hx of sarcoid---currently on remicade  weight loss---weight watchers  Preventive Screening-Counseling & Management  Alcohol-Tobacco     Smoking Status: never  Current Medications (verified): 1)  Atenolol-Chlorthalidone 50-25 Mg Tabs (Atenolol-Chlorthalidone) .... Take 1 Tablet By Mouth Once A Day 2)  Effexor Xr 75 Mg Cp24 (Venlafaxine Hcl) .... Take 1 Tablet By Mouth Once A Day 3)  Lorazepam 1 Mg Tabs (Lorazepam) .... One Every 6 Hrs As Needed 4)  Novolog Flexpen 100 Unit/ml Soln (Insulin Aspart) .... Three Times A Day As Directed 5)  Onetouch Ultra Test  Strp (Glucose Blood) .... Tid 6)  Potassium Chloride Crys Cr 20 Meq Tbcr (Potassium Chloride Crys Cr) .[name].. 1 Three Times A Day 7)  Zegerid 40-1100 Mg Caps (Omeprazole-Sodium Bicarbonate) .... Take 1 Tablet By Mouth Once A Day 8)  Pen Needles 31g X 8 Mm  Misc (Insulin Pen Needle) .... Use As Directed 9)  Remicade 100 Mg  Solr (Infliximab) .... Injection Every 6 Weeks 10)  Fluticasone Propionate 50 Mcg/act  Susp (Fluticasone Propionate) .... 2 Sprays Each Nostril Once  Daily 11)  Furosemide 20 Mg Tabs (Furosemide) .... Take One (1) By Mouth Daily 12)  Vicodin 5-500 Mg  Tabs (Hydrocodone-Acetaminophen) .... One Tab Every 6 Hours As Needed 13)  Estrace 1 Mg Tabs (Estradiol) .... Take 1 Tablet By Mouth Once A Day 14)  Restasis 0.05 % Emul (Cyclosporine) .[name].. 1 Drop Each Eye Daily 15)  Lotemax 0.5 % Susp (Loteprednol Etabonate) .[name].. 1 Drop Each Eye Every 6 Hours As Needed 16)  Tussionex Pennkinetic Er 8-10 Mg/68ml Lqcr (Chlorpheniramine-Hydrocodone) .[name].. 1 Tsp Every 12 Hr As Needed For Cough, May Make You Sleepy 17)  Simvastatin 40 Mg Tabs (Simvastatin) .... Take One Tablet At Bedtime Start One Week After Completing Diflucan 18)  Magnesium Oxide 250 Mg Tabs (Magnesium Oxide) .... One Tablet By Mouth Once Daily 19)  Levbid 0.375 Mg Xr12h-Tab (Hyoscyamine Sulfate) .[name].. 1 By Mouth Two Times A Day As Needed 20)  Prednisone 5 Mg Tabs (Prednisone) .... Take 1 Tablet By Mouth Every Other Day 21)  Cpap 7 Cm 22)  Diclofenac Sodium 75 Mg Tbec (Diclofenac Sodium) .[name].. 1 By Mouth Two Times A Day After Meals  Allergies: 1)  ! Methotrexate  Physical Exam  General:  Well-developed,well-nourished,in no acute distress; alert,appropriate and cooperative throughout examinationoverweight-appearing.   Head:  normocephalic and atraumatic.   Ears:  Tms retracted bilaterally Neck:  no masses, thyromegaly, or abnormal  cervical nodes Lungs:  Normal respiratory effort, chest expands symmetrically. Lungs are clear to auscultation, no crackles or wheezes.   Impression & Recommendations:  Problem # 1:  URI (ICD-465.9) no evidence of bacterial infection. call for any concerns, increased sxs, fever, persistence of sxs, wheeze, SOB.   mucinex dm as needed use fluticasone as prescribed note weight loss she is having hypoglycemia on insulin---stop insulin  Complete Medication List: 1)  Atenolol-chlorthalidone 50-25 Mg Tabs (Atenolol-chlorthalidone) .... Take 1 tablet by mouth once a  day 2)  Effexor Xr 75 Mg Cp24 (Venlafaxine hcl) .... Take 1 tablet by mouth once a day 3)  Lorazepam 1 Mg Tabs (Lorazepam) .... One every 6 hrs as needed 4)  Onetouch Ultra Test Strp (Glucose blood) .... Tid 5)  Potassium Chloride Crys Cr 20 Meq Tbcr (Potassium chloride crys cr) .[name].. 1 three times a day 6)  Zegerid 40-1100 Mg Caps (Omeprazole-sodium bicarbonate) .... Take 1 tablet by mouth once a day 7)  Pen Needles 31g X 8 Mm Misc (Insulin pen needle) .... Use as directed 8)  Remicade 100 Mg Solr (Infliximab) .... Injection every 6 weeks 9)  Fluticasone Propionate 50 Mcg/act Susp (Fluticasone propionate) .... 2 sprays each nostril once daily 10)  Furosemide 20 Mg Tabs (Furosemide) .... Take one (1) by mouth daily 11)  Vicodin 5-500 Mg Tabs (Hydrocodone-acetaminophen) .... One tab every 6 hours as needed 12)  Estrace 1 Mg Tabs (Estradiol) .... Take 1 tablet by mouth once a day 13)  Restasis 0.05 % Emul (Cyclosporine) .[name].. 1 drop each eye daily 14)  Lotemax 0.5 % Susp (Loteprednol etabonate) .[name].. 1 drop each eye every 6 hours as needed 15)  Tussionex Pennkinetic Er 8-10 Mg/61ml Lqcr (Chlorpheniramine-hydrocodone) .[name].. 1 tsp every 12 hr as needed for cough, may make you sleepy 16)  Simvastatin 40 Mg Tabs (Simvastatin) .... Take one tablet at bedtime start one week after completing diflucan 17)  Magnesium Oxide 250 Mg Tabs (Magnesium oxide) .... One tablet by mouth once daily 18)  Levbid 0.375 Mg Xr12h-tab (Hyoscyamine sulfate) .[name].. 1 by mouth two times a day as needed 19)  Prednisone 5 Mg Tabs (Prednisone) .... Take 1 tablet by mouth every other day 20)  Cpap 7 Cm  21)  Diclofenac Sodium 75 Mg Tbec (Diclofenac sodium) .[name].. 1 by mouth two times a day after meals  Other Orders: UA Dipstick w/o Micro (automated)  (16109)  Prevention & Chronic Care Immunizations   Influenza vaccine: Fluvax 3+  (07/23/2009)   Influenza vaccine due: 06/26/2009    Tetanus booster: Not documented    Pneumococcal  vaccine: Pneumovax  (09/30/2008)  Colorectal Screening   Hemoccult: Not documented    Colonoscopy: Location:  [facility].  Results: Normal.   (02/19/2005)   Colonoscopy due: 02/2010  Other Screening   Pap smear: Not documented    Mammogram: Not documented   Smoking status: never  (01/13/2010)  Diabetes Mellitus   HgbA1C: 6.6  (12/02/2009)   Hemoglobin A1C due: 09/05/2008    Eye exam: normal-pt's report  (07/11/2008)   Eye exam due: 07/11/2009    Foot exam: Not documented   High risk foot: Not documented   Foot care education: Not documented    Urine microalbumin/creatinine ratio: 2.2  (05/24/2007)   Urine microalbumin/cr due: 05/23/2008  Lipids   Total Cholesterol: 171  (12/02/2009)   LDL: DEL  (09/30/2008)   LDL Direct: 107.7  (12/02/2009)   HDL: 44.10  (12/02/2009)   Triglycerides: 314.0  (12/02/2009)  SGOT (AST): 19  (12/02/2009)   SGPT (ALT): 16  (12/02/2009)   Alkaline phosphatase: 85  (12/02/2009)   Total bilirubin: 0.5  (12/02/2009)  Hypertension   Last Blood Pressure: 114 / 72  (01/13/2010)   Serum creatinine: 0.7  (12/02/2009)   Serum potassium 3.4  (12/02/2009)  Self-Management Support :    Diabetes self-management support: Not documented    Hypertension self-management support: Not documented    Lipid self-management support: Not documented    Laboratory Results   Urine Tests    Routine Urinalysis   Color: yellow Appearance: Clear Glucose: negative   (Normal Range: Negative) Bilirubin: 1+   (Normal Range: Negative) Ketone: trace (5)   (Normal Range: Negative) Spec. Gravity: 1.015   (Normal Range: 1.003-1.035) Blood: trace-intact   (Normal Range: Negative) pH: 6.0   (Normal Range: 5.0-8.0) Protein: negative   (Normal Range: Negative) Urobilinogen: 0.2   (Normal Range: 0-1) Nitrite: negative   (Normal Range: Negative) Leukocyte Esterace: trace   (Normal Range: Negative)    Comments: Rita Ohara  January 13, 2010  10:22 AM

## 2010-11-12 NOTE — Assessment & Plan Note (Signed)
Summary: headaches/mhf   Vital Signs:  Patient Profile:   50 Years Old Female Height:     66 inches Temp:     98.4 degrees F Pulse rate:   80 / minute BP sitting:   132 / 66  (left arm) Cuff size:   large  Vitals Entered By: Gladis Riffle, RN (March 25, 2008 2:46 PM)                 Referred by:  Chase Picket PCP:  Dr. Birdie Sons  Chief Complaint:  c/o headache, getting worse, no relief from OTC, and Headaches.  History of Present Illness:  Headaches      [name] who presents with Headaches.  The symptoms began 1 week ago.  On a scale of 1 to 10, the intensity is described as a 4.  The patient denies nausea, vomiting, sweats, tearing of eyes, nasal congestion, sinus pain, sinus pressure, photophobia, and phonophobia.  The headache is described as intermittent.  The location of the pain is bitemporal.  The patient denies the following high-risk features: fever, neck pain/stiffness, vision loss or change, focal weakness, altered mental status, rash, trauma, pain worse with exertion, new type of headache, age >50 years, immunosuppression, concomitant infection, and anticoagulation use.  Prior treatment has included a decongestant, a NSAID, and acetaminophen.   pain waxes and wanes but is daily and bilateral. no neurologic deficits, no visual deficits  Past Medical History: Anemia-NOS Diabetes mellitus, type II GERD Hyperlipidemia Hypertension pulmonary nodule OSA Vocal Cord dysfunction with recurrent cough Asthma vs Vocal cord dysfundtion SARCOID  Social History: Married Never Smoked Regular exercise-no Alcohol use-no  Family History: father-alive MI, DM mother alive and well uncle with SARCOID Current Meds:  PROAIR HFA 108 (90 BASE) MCG/ACT  AERS (ALBUTEROL SULFATE) 2 puffs three times a day as needed ATENOLOL-CHLORTHALIDONE 50-25 MG TABS (ATENOLOL-CHLORTHALIDONE) 1/1/2 daily BUDEPRION XL 300 MG TB24 (BUPROPION HCL) Take 1 tablet by mouth once a  day EFFEXOR XR 75 MG CP24 (VENLAFAXINE HCL) Take 1 capsule by mouth once a day LORAZEPAM 1 MG TABS (LORAZEPAM) prn NOVOLOG FLEXPEN 100 UNIT/ML SOLN (INSULIN ASPART) three times a day as directed ONETOUCH ULTRA TEST  STRP (GLUCOSE BLOOD) tid POTASSIUM CHLORIDE CRYS CR 20 MEQ TBCR (POTASSIUM CHLORIDE CRYS CR) Take 1 tablet by mouth two times a day PREDNISONE 10 MG  TABS (PREDNISONE) 1 1/2 once daily REGLAN 10 MG TABS (METOCLOPRAMIDE HCL) Take 1 tablet by mouth twice a day ZEGERID 40-1100 MG CAPS (OMEPRAZOLE-SODIUM BICARBONATE) 2 by mouth daily CRESTOR 20 MG TABS (ROSUVASTATIN CALCIUM) 1 by mouth once daily QVAR 80 MCG/ACT  AERS (BECLOMETHASONE DIPROPIONATE) two puffs twice daily PEN NEEDLES 31G X 8 MM  MISC (INSULIN PEN NEEDLE) USE AS DIRECTED NEED F/U APPT B-4 ADDTIONAL REFILLS PREMARIN 0.3 MG TABS (ESTROGENS CONJUGATED) take two daily REMICADE 100 MG  SOLR (INFLIXIMAB) Injection every 6 weeks FLUTICASONE PROPIONATE 50 MCG/ACT  SUSP (FLUTICASONE PROPIONATE) 2 sprays each nostril once daily TUSSIONEX PENNKINETIC ER 8-10 MG/5ML LQCR (CHLORPHENIRAMINE-HYDROCODONE) 1 tsp two times a day as needed FUROSEMIDE 20 MG TABS (FUROSEMIDE) Take one (1) by mouth daily KEFLEX 500 MG  CAPS (CEPHALEXIN) One three times a day x 7 days. VICODIN 5-500 MG  TABS (HYDROCODONE-ACETAMINOPHEN) One tab every 6 hours as needed AMLODIPINE BESYLATE 10 MG  TABS (AMLODIPINE BESYLATE) Take 1 tablet by mouth once a day      Current Allergies (reviewed today): ! METHOTREXATE     Review  of Systems       no other complaints in a complete ROS    Physical Exam  General:     Well-developed,well-nourished,in no acute distress; alert,appropriate and cooperative throughout examination Head:     normocephalic.  temporal pulses normal Eyes:     pupils equal, pupils round, and pupils reactive to light.   Nose:     no external deformity and no external erythema.   Neck:     No deformities, masses, or tenderness  noted. Lungs:     Normal respiratory effort, chest expands symmetrically. Lungs are clear to auscultation, no crackles or wheezes. Heart:     Normal rate and regular rhythm. S1 and S2 normal without gallop, murmur, click, rub or other extra sounds.    Impression & Recommendations:  Problem # 1:  HEADACHE (ICD-784.0) unclear cause could be efexor/budeprion-decrease both to every other day (she will alternate days) discussed with patient and husband Her updated medication list for this problem includes:    Atenolol-chlorthalidone 50-25 Mg Tabs (Atenolol-chlorthalidone) .[name]... 1/1/2 daily    Vicodin 5-500 Mg Tabs (Hydrocodone-acetaminophen) ..... One tab every 6 hours as needed   Complete Medication List: 1)  Proair Hfa 108 (90 Base) Mcg/act Aers (Albuterol sulfate) .... 2 puffs three times a day as needed 2)  Atenolol-chlorthalidone 50-25 Mg Tabs (Atenolol-chlorthalidone) .... 1/1/2 daily 3)  Budeprion Xl 300 Mg Tb24 (Bupropion hcl) .... Take 1 tablet by mouth once a day 4)  Effexor Xr 75 Mg Cp24 (Venlafaxine hcl) .... Take 1 capsule by mouth once a day 5)  Lorazepam 1 Mg Tabs (Lorazepam) .... Prn 6)  Novolog Flexpen 100 Unit/ml Soln (Insulin aspart) .... Three times a day as directed 7)  Onetouch Ultra Test Strp (Glucose blood) .... Tid 8)  Potassium Chloride Crys Cr 20 Meq Tbcr (Potassium chloride crys cr) .... Take 1 tablet by mouth two times a day 9)  Prednisone 10 Mg Tabs (Prednisone) .[name].. 1 1/2 once daily 10)  Zegerid 40-1100 Mg Caps (Omeprazole-sodium bicarbonate) .... 2 by mouth daily 11)  Crestor 20 Mg Tabs (Rosuvastatin calcium) .[name].. 1 by mouth once daily 12)  Qvar 80 Mcg/act Aers (Beclomethasone dipropionate) .... Two puffs twice daily 13)  Pen Needles 31g X 8 Mm Misc (Insulin pen needle) .... Use as directed need f/u appt b-4 addtional refills 14)  Premarin 0.3 Mg Tabs (Estrogens conjugated) .... Take two daily 15)  Remicade 100 Mg Solr (Infliximab) .... Injection every 6  weeks 16)  Fluticasone Propionate 50 Mcg/act Susp (Fluticasone propionate) .... 2 sprays each nostril once daily 17)  Tussionex Pennkinetic Er 8-10 Mg/12ml Lqcr (Chlorpheniramine-hydrocodone) .[name].. 1 tsp two times a day as needed 18)  Furosemide 20 Mg Tabs (Furosemide) .... Take one (1) by mouth daily 19)  Vicodin 5-500 Mg Tabs (Hydrocodone-acetaminophen) .... One tab every 6 hours as needed 20)  Amlodipine Besylate 10 Mg Tabs (Amlodipine besylate) .... Take 1 tablet by mouth once a day    Prescriptions: REGLAN 10 MG TABS (METOCLOPRAMIDE HCL) Take 1 tablet by mouth twice a day  #100 x 3   Entered and Authorized by:   Birdie Sons MD   Signed by:   Birdie Sons MD on 03/25/2008   Method used:   Print then Give to Patient   RxID:   [id number] AMLODIPINE BESYLATE 10 MG  TABS (AMLODIPINE BESYLATE) Take 1 tablet by mouth once a day  #100 x 3   Entered and Authorized by:   Birdie Sons MD  Signed by:   Birdie Sons MD on 03/25/2008   Method used:   Print then Give to Patient   RxID:   [id number] VICODIN 5-500 MG  TABS (HYDROCODONE-ACETAMINOPHEN) One tab every 6 hours as needed  #20 x 0   Entered and Authorized by:   Birdie Sons MD   Signed by:   Birdie Sons MD on 03/25/2008   Method used:   Print then Give to Patient   RxID:   [id number] BUDEPRION XL 300 MG TB24 (BUPROPION HCL) Take 1 tablet by mouth once a day  #100 Tablet x 3   Entered and Authorized by:   Birdie Sons MD   Signed by:   Birdie Sons MD on 03/25/2008   Method used:   Electronically sent to ...       [facility]       [address]       [place], [place]  [address]       Ph: [phone]       Fax: [phone]   RxID:   [id number] EFFEXOR XR 75 MG CP24 (VENLAFAXINE HCL) Take 1 capsule by mouth once a day  #30 Capsule x 11   Entered and Authorized by:   Birdie Sons MD   Signed by:   Birdie Sons MD on 03/25/2008   Method used:   Electronically sent to  ...       [facility]       [address]       [place], [place]  [address]       Ph: [phone]       Fax: [phone]   RxID:   [id number]  ]

## 2010-11-12 NOTE — Progress Notes (Signed)
Summary: Tussionex refill  Phone Note Call from Patient Call back at [phone] EX [phone]   Caller: Spouse Call For: [name] Summary of Call: NEED PRESCRIPT FOR TUSSINEX CALLED TO PHARMACY Initial call taken by: Rickard Patience,  September 09, 2008 2:25 PM  Follow-up for Phone Call        Pt requesting a refill on her as needed Tussionex.  Is this OK to refill?  Please advise. Thanks. Follow-up by: [name] RN,  September 09, 2008 3:15 PM  Additional Follow-up for Phone Call Additional follow up Details #1::        yes ok to do so   Additional Follow-up by: Storm Frisk MD,  September 09, 2008 4:44 PM    Additional Follow-up for Phone Call Additional follow up Details #2::    Called spoke with pt's husband.  Advised refill called into pharmacy as requested.  Follow-up by: [name] RN,  September 09, 2008 4:56 PM  New/Updated Medications: Sandria Senter ER 8-10 MG/5ML LQCR (CHLORPHENIRAMINE-HYDROCODONE) Take 1 teaspoon every 12 hrs as needed cough   Prescriptions: TUSSIONEX PENNKINETIC ER 8-10 MG/5ML LQCR (CHLORPHENIRAMINE-HYDROCODONE) Take 1 teaspoon every 12 hrs as needed cough  #4 oz x 0   Entered by:   [name] RN   Authorized by:   Storm Frisk MD   Signed by:   [name] RN on 09/09/2008   Method used:   Telephoned to ...       [facility] (retail)       [address]       [place], [place]  [address]       Ph: [phone]       Fax: [phone]   RxID:   [id number]

## 2010-11-12 NOTE — Progress Notes (Signed)
Summary: call back later today 10-9  Phone Note Call from Patient Call back at [phone]   Caller: pt husband Call For: Swords Summary of Call: Saw Dr Cato Mulligan yesterday and she had xray done yesterday.  She would like something for pain called in,  [facility] [phone]. Initial call taken by: Celine Ahr,  July 18, 2008 3:19 PM  Follow-up for Phone Call        Reviewed and forwarded to Dr Tomasa Rand LPN  July 18, 2008 4:44 PM  continue meloxicam...  Follow-up by: Birdie Sons MD,  July 19, 2008 10:16 AM  Additional Follow-up for Phone Call Additional follow up Details #1::        Elderly lady siad she's not there & to call back later Rudy Jew, RN  July 19, 2008 12:39 PM  Patient aware.   Additional Follow-up by: Rudy Jew, RN,  July 19, 2008 3:16 PM

## 2010-11-12 NOTE — Assessment & Plan Note (Signed)
Summary: 23mo rov/mm rsc bmp/njr/PT RESCD//CCM   Vital Signs:  Patient profile:   50 year old female Weight:      268 pounds Temp:     97.5 degrees F Pulse rate:   60 / minute BP sitting:   122 / 74  (left arm) Cuff size:   large  Vitals Entered By: Gladis Riffle, RN (June 04, 2009 8:23 AM)  Primary Care Provider:  Leeanne Deed   History of Present Illness:  Follow-Up Visit      [name] who presents for Follow-up visit.  The patient denies chest pain, palpitations, dizziness, syncope, edema, SOB, DOE, PND, and orthopnea.  Since the last visit the patient notes being seen by a specialist.  The patient reports taking meds as prescribed.  When questioned about possible medication side effects, the patient notes none.    egd--see results  Current Problems (verified): 1)  Candidiasis  (ICD-112.9) 2)  Myalgia  (ICD-729.1) 3)  Sleep Apnea  (ICD-780.57) 4)  Nonspecific Abnormal Toxicological Findings  (ICD-796.0) 5)  Headache  (ICD-784.0) 6)  Menopausal Syndrome  (ICD-627.2) 7)  Sarcoidosis  (ICD-135) 8)  Hypertension  (ICD-401.9) 9)  Hyperlipidemia  (ICD-272.4) 10)  Gerd  (ICD-530.81) 11)  Diabetes Mellitus, Type II  (ICD-250.00)  Current Medications (verified): 1)  Atenolol-Chlorthalidone 50-25 Mg Tabs (Atenolol-Chlorthalidone) .... Take 1 Tablet By Mouth Once A Day 2)  Budeprion Xl 300 Mg Tb24 (Bupropion Hcl) .... Take 1 Tablet By Mouth Once A Day 3)  Effexor Xr 75 Mg Cp24 (Venlafaxine Hcl) .... Take 1 Capsule By Mouth Two Times A Day 4)  Lorazepam 1 Mg Tabs (Lorazepam) .... One Every 6 Hrs As Needed 5)  Novolog Flexpen 100 Unit/ml Soln (Insulin Aspart) .... Three Times A Day As Directed 6)  Onetouch Ultra Test  Strp (Glucose Blood) .... Tid 7)  Potassium Chloride Crys Cr 20 Meq Tbcr (Potassium Chloride Crys Cr) .[name].. 1 Three Times A Day 8)  Prednisone 10 Mg Tabs (Prednisone) .[name].. 1 in Am   1/2 in Pm 9)  Zegerid 40-1100 Mg Caps (Omeprazole-Sodium  Bicarbonate) .... One Twice Daily 10)  Crestor 20 Mg Tabs (Rosuvastatin Calcium) .[name].. 1 By Mouth Once Daily 11)  Pen Needles 31g X 8 Mm  Misc (Insulin Pen Needle) .... Use As Directed 12)  Remicade 100 Mg  Solr (Infliximab) .... Injection Every 6 Weeks 13)  Fluticasone Propionate 50 Mcg/act  Susp (Fluticasone Propionate) .... 2 Sprays Each Nostril Once Daily 14)  Furosemide 20 Mg Tabs (Furosemide) .... Take One (1) By Mouth Daily 15)  Vicodin 5-500 Mg  Tabs (Hydrocodone-Acetaminophen) .... One Tab Every 6 Hours As Needed 16)  Vitamin D 1000 Unit  Caps (Cholecalciferol) .... Two Times A Day 17)  Estrace 1 Mg Tabs (Estradiol) .... Take 1 Tablet By Mouth Once A Day 18)  Slow Release Iron 47.5 Mg Cr-Tabs (Ferrous Sulfate) .... Once Daily 19)  Multiple Vitamin  Tabs (Multiple Vitamin) .... Once Daily 20)  Tussionex Pennkinetic Er 8-10 Mg/28ml Lqcr (Chlorpheniramine-Hydrocodone) .... Take 1 Teaspoon Every 12 Hrs As Needed Cough 21)  Restasis 0.05 % Emul (Cyclosporine) .[name].. 1 Drop Each Eye Daily 22)  Lotemax 0.5 % Susp (Loteprednol Etabonate) .[name].. 1 Drop Each Eye Every 6 Hours As Needed 23)  Utac 500-500 Mg Tabs (Methenamine Mand-Sod Phosphate) .... Two Times A Day 24)  Zantac 150 Mg Tabs (Ranitidine Hcl) .... One Daily 25)  Tessalon Perles 100 Mg  Caps (Benzonatate) .... One To  Two By Mouth 3-4 Times Daily  Allergies: 1)  ! Methotrexate  Comments:  Nurse/Medical Assistant: 1 month rov, labs done--  The patient's medications and allergies were reviewed with the patient and were updated in the Medication and Allergy Lists. Gladis Riffle, RN (June 04, 2009 8:25 AM)  Past History:  Past Medical History: Last updated: 04/21/2009 EDEMA (ICD-782.3) HEADACHE (ICD-784.0) MENOPAUSAL SYNDROME (ICD-627.2) UTI (ICD-599.0) SARCOIDOSIS (ICD-135)   -CT Chest 6/08 improvement in Mediastinal LAN   -11/08 start of remecade Rx per [facility] PULMONARY NODULE (ICD-518.89)    -CT Chest 6/08  reduction in size of nodules due to Arava and Pred RX     -probable granulomas due to Sarcoidosis HYPERTENSION (ICD-401.9) HYPERLIPIDEMIA (ICD-272.4) GERD (ICD-530.81) Sleep APnea DIABETES MELLITUS, TYPE II (ICD-250.00) ANEMIA-NOS (ICD-285.9)    Past Surgical History: Last updated: 08/21/2006 Ventral Hernia Repair Nissen Fundoplication Cholecystectomy Hysterectomy  Family History: Last updated: 05/12/2009 father-alive MI, DM mother alive and well uncle with SARCOID No FH of Colon Cancer:  Social History: Last updated: 05/12/2009 Married Never Smoked Regular exercise-no Alcohol use-no Illicit Drug Use - no  Risk Factors: Exercise: no (05/31/2007)  Risk Factors: Smoking Status: never (05/30/2009)  Review of Systems       All other systems reviewed and were negative   Physical Exam  General:  nad, obese Head:  normocephalic and atraumatic.   Eyes:  pupils equal and pupils round.   Ears:  R ear normal and L ear normal.   Neck:  no masses, thyromegaly, or abnormal cervical nodes Chest Wall:  No deformities, masses, or tenderness noted. Lungs:  Normal respiratory effort, chest expands symmetrically. Lungs are clear to auscultation, no crackles or wheezes. Heart:  normal rate and no murmur.   Abdomen:  Bowel sounds positive,abdomen soft and non-tender without masses, organomegaly or hernias noted.  obese Msk:  No deformity or scoliosis noted of thoracic or lumbar spine.   no joint swelling or erythema Neurologic:  cranial nerves II-XII intact and gait normal.   Skin:  turgor normal and color normal.   Psych:  normally interactive and good eye contact.     Impression & Recommendations:  Problem # 1:  OBESITY (ICD-278.00) this is her main problem---must lose weight , she voices understanding  Problem # 2:  HYPERTENSION (ICD-401.9) well controlled continue current medications  Her updated medication list for this problem includes:     Atenolol-chlorthalidone 50-25 Mg Tabs (Atenolol-chlorthalidone) .[name]... Take 1 tablet by mouth once a day    Furosemide 20 Mg Tabs (Furosemide) .[name]... Take one (1) by mouth daily  BP today: 122/74 Prior BP: 116/72 (05/30/2009)  Prior 10 Yr Risk Heart Disease: 8 % (09/21/2006)  Labs Reviewed: K+: 3.7 (05/30/2009) Creat: : 0.7 (05/30/2009)   Chol: 184 (05/08/2009)   HDL: 45.80 (05/08/2009)   LDL: DEL (09/30/2008)   TG: 383.0 (05/08/2009)  Problem # 3:  SARCOIDOSIS (ICD-135) no current sxs  followed by dr. Delford Field  Problem # 4:  HYPERLIPIDEMIA (ICD-272.4) fair control continue current medications  Her updated medication list for this problem includes:    Crestor 20 Mg Tabs (Rosuvastatin calcium) .[name]... 1 by mouth once daily  Labs Reviewed: SGOT: 24 (05/08/2009)   SGPT: 19 (05/08/2009)  Prior 10 Yr Risk Heart Disease: 8 % (09/21/2006)   HDL:45.80 (05/08/2009), 39.90 (04/21/2009)  LDL:DEL (09/30/2008), DEL (06/05/2008)  Chol:184 (05/08/2009), 158 (04/21/2009)  Trig:383.0 (05/08/2009), 319.0 (04/21/2009)  Problem # 5:  DIABETES MELLITUS, TYPE II (ICD-250.00) good  control on current  insulin regimen Her updated medication list for this problem includes:    Novolog Flexpen 100 Unit/ml Soln (Insulin aspart) .[name]... Three times a day as directed  Labs Reviewed: Creat: 0.7 (05/30/2009)     Last Eye Exam: normal-pt's report (07/11/2008) Reviewed HgBA1c results: 6.5 (05/08/2009)  6.5 (04/21/2009)  Complete Medication List: 1)  Atenolol-chlorthalidone 50-25 Mg Tabs (Atenolol-chlorthalidone) .... Take 1 tablet by mouth once a day 2)  Budeprion Xl 300 Mg Tb24 (Bupropion hcl) .... Take 1 tablet by mouth once a day 3)  Effexor Xr 75 Mg Cp24 (Venlafaxine hcl) .... Take 1 capsule by mouth two times a day 4)  Lorazepam 1 Mg Tabs (Lorazepam) .... One every 6 hrs as needed 5)  Novolog Flexpen 100 Unit/ml Soln (Insulin aspart) .... Three times a day as directed 6)  Onetouch Ultra Test Strp (Glucose  blood) .... Tid 7)  Potassium Chloride Crys Cr 20 Meq Tbcr (Potassium chloride crys cr) .[name].. 1 three times a day 8)  Prednisone 10 Mg Tabs (Prednisone) .[name].. 1 in am   1/2 in pm 9)  Zegerid 40-1100 Mg Caps (Omeprazole-sodium bicarbonate) .... One twice daily 10)  Crestor 20 Mg Tabs (Rosuvastatin calcium) .[name].. 1 by mouth once daily 11)  Pen Needles 31g X 8 Mm Misc (Insulin pen needle) .... Use as directed 12)  Remicade 100 Mg Solr (Infliximab) .... Injection every 6 weeks 13)  Fluticasone Propionate 50 Mcg/act Susp (Fluticasone propionate) .... 2 sprays each nostril once daily 14)  Furosemide 20 Mg Tabs (Furosemide) .... Take one (1) by mouth daily 15)  Vicodin 5-500 Mg Tabs (Hydrocodone-acetaminophen) .... One tab every 6 hours as needed 16)  Vitamin D 1000 Unit Caps (Cholecalciferol) .... Two times a day 17)  Estrace 1 Mg Tabs (Estradiol) .... Take 1 tablet by mouth once a day 18)  Slow Release Iron 47.5 Mg Cr-tabs (Ferrous sulfate) .... Once daily 19)  Multiple Vitamin Tabs (Multiple vitamin) .... Once daily 20)  Tussionex Pennkinetic Er 8-10 Mg/23ml Lqcr (Chlorpheniramine-hydrocodone) .... Take 1 teaspoon every 12 hrs as needed cough 21)  Restasis 0.05 % Emul (Cyclosporine) .[name].. 1 drop each eye daily 22)  Lotemax 0.5 % Susp (Loteprednol etabonate) .[name].. 1 drop each eye every 6 hours as needed 23)  Utac 500-500 Mg Tabs (Methenamine mand-sod phosphate) .... Two times a day 24)  Zantac 150 Mg Tabs (Ranitidine hcl) .... One daily 25)  Tessalon Perles 100 Mg Caps (Benzonatate) .... One to two by mouth 3-4 times daily  Other Orders: UA Dipstick w/Micro (automated) (81001) T-Culture, Urine ([id number])  Patient Instructions: 1)  see me 6 weeks  Laboratory Results   Urine Tests    Routine Urinalysis   Color: yellow Appearance: Cloudy Glucose: negative   (Normal Range: Negative) Bilirubin: negative   (Normal Range: Negative) Ketone: negative   (Normal Range: Negative) Spec. Gravity:  1.025   (Normal Range: 1.003-1.035) Blood: negative   (Normal Range: Negative) pH: 5.5   (Normal Range: 5.0-8.0) Protein: negative   (Normal Range: Negative) Urobilinogen: 0.2   (Normal Range: 0-1) Nitrite: negative   (Normal Range: Negative) Leukocyte Esterace: trace   (Normal Range: Negative)    Comments: Rita Ohara  June 04, 2009 8:38 AM

## 2010-11-12 NOTE — Op Note (Signed)
Summary: EGD-[facility]                    . [facility]  Patient:    [name], [name]                     MRN: [id number] Proc. Date: 12/09/99 Adm. Date:  [id number] Attending:  Abigail Miyamoto CC:         [name]. [name], M.D.             [name], M.D. [facility]             [name]. Abigail Miyamoto, M.D.                           Operative Report  PROCEDURE:  Esophagogastroduodenoscopy.  INDICATIONS:  Dysphagia.  HISTORY:  This is a 50 year old female with a history of reflux disease, who is  status post open Nissen fundoplication in August 2000.  She was recently evaluated in the office for dysphagia. (see that dictation for details)  She is now admitted for endoscopy.  The nature of the procedure, as well as the  risks, benefits and alternatives were reviewed. The patient seemed to understand agreed to proceed.  PHYSICAL EXAMINATION:  GENERAL:  A well-appearing female, in no acute distress.  She is alert and oriented  VITAL SIGNS:  Stable.  LUNGS:  Clear.  HEART:  Regular.  ABDOMEN:  Obese and soft.  DESCRIPTION OF PROCEDURE:  After informed consent was obtained, the patient was  sedated with 80 mg of Demerol, 9 mg of Versed IV.  The Olympus endoscope was passed orally under direct vision into the esophagus.  The esophagus was normal throughout, no evidence of esophagitis or obstructing lesions.  No evidence of stricture.  The gastroesophageal junction was in normal anatomic position. Minimal resistance was required to enter the stomach.  The stomach revealed evidence of  intact wrap.  No other abnormalities.  The duodenal bulb and posterior bulb and  duodenum were normal.  IMPRESSION:  Normal upper endoscopy, post-fundoplication.  Fundoplication wrap intact.  I expect her symptoms or sensation is secondary to her wrap and will improve with time.   I provided reassurance.  No other therapies or medications  indicated at this  time.  RECOMMENDATIONS:  Follow up p.r.n. DD:  12/09/99 TD:  12/09/99 Job: [id number]

## 2010-11-12 NOTE — Letter (Signed)
Summary: [facility]  [facility] Provider: This provider was preselected by the workflow.  Signature: The signature status of this document was preset by the workflow  Processed by [employer] Local Indexer Client @ Friday, September 12, 2009 12:50:51 PM using version:2010.1.2.11(2.4)   Manually Indexed By: [id number]  idlBatchDetail: [id number]   _____________________________________________________________________  External Attachment:    Type:   Image     Comment:   External Document

## 2010-11-12 NOTE — Procedures (Signed)
Summary: EUS   EUS  Procedure date:  06/16/2006  Findings:      Location: [facility]   Patient Name: [name], [name] MRN:  Procedure Procedures: Panendoscopy with EUSCPT: [id number].   with Fine Needle Aspiration Personnel: Endoscopist: Rachael Fee, MD.  Exam Location: Exam performed in Endoscopy Suite. Outpatient  Patient Consent: Procedure, Alternatives, Risks and Benefits discussed, consent obtained, from patient. Consent was obtained by the RN.  Indications  Assessment: mediastinal adenopathy in pateint with chronic cough.  History  Current Medications: Patient is not currently taking Coumadin.  Pre-Exam Physical: Performed Jun 16, 2006. Entire physical exam was normal. Cardio- pulmonary exam WNL. Abdominal exam abnormal. Mental status exam WNL. Abnormal PE findings include: Obese.  Exam Exam: Images were taken.  Patient: ASA Classification: II. Tolerance: good.  Sedation Meds:  ~OBJECTIVE5Sedation Meds Patient assessed and found to be appropriate for moderate (conscious) sedation. Fentanyl 125 mcg. given IV. Versed 12 given IV.  Monitoring: BP and pulse monitoring done. Oximetry was used. Supplemental O2 given.  EUS Scopes: Curvilinear Array Echoendoscope used   Comments: Endoscopic examination: 1. Normal esophagus. 2. Normal stomach. 3. Normal duodenum.  EUS examination: 1. Benign appearing subcarinal adenopathy.  The adenopathy measured 1. 7cm maximally, was hypoechoic and irregularly shaped.  There were several small cysts adjacent to the lymphnode (each measuring 5mm maximally).  FNA was performed on the subcarinal LN using 2 passes of 19guage Echo Tip FNA needle under color Doppler guidance to avoid nearby vessels.  Preliminary cytology review showed benign appearing lymphocytes. 2. Limited views of liver, spleen, pancreas, portal vein were all normal. Assessment Upper GI Abnormal examination, see findings above.   Comments: Subcarinal adenopathy, sampled during this examination.  Preliminary cytology reveiw suggest benign lymphocytes.  There was a collection of 3-4 small cysts adjancent to the subcarinal LN, these are of unclear clinical significance. Events  Unplanned Intervention: No intervention was required.  Unplanned Events: There were no complications. Plans Comments: Await final cytology.  I will communicate the results directly with [name] and Dr. Cato Mulligan.  This report was created from the original endoscopy report, which was reviewed and signed by the above listed endoscopist.   cc:  Birdie Sons, MD      Shan Levans, MD      The Patient

## 2010-11-12 NOTE — Progress Notes (Signed)
Summary: uti needs to be worked in Therapist, art from Patient Call back at [phone]   Caller: pt live Call For: Swords Summary of Call: UTI hurt in her back and side and private area.  Hurts to go to the bathroom.  Does not have a fever.   Initial call taken by: Roselle Locus,  May 08, 2008 9:36 AM  Follow-up for Phone Call        2:15 QUICK CHECK.  pATIENT AWARE. Follow-up by: Rudy Jew, RN,  May 08, 2008 10:01 AM

## 2010-11-12 NOTE — Progress Notes (Signed)
Summary: Med question?   Phone Note Call from Patient   Caller: Spouse-William Call For: Birdie Sons MD Summary of Call: Pt's spouse called and they were wondering does she need to continue taking the Crestor? Ms.Moehle was under the impression that was what the blood test was for. Said she was taking the Potassium three times a day per the last phone call they got. Call pt back on cell phone. [phone] Initial call taken by: Army Fossa CMA,  April 23, 2009 2:16 PM  Follow-up for Phone Call        stay on crestor Follow-up by: Birdie Sons MD,  April 24, 2009 11:09 AM  Additional Follow-up for Phone Call Additional follow up Details #1::        Patient informed. Additional Follow-up by: Darra Lis RMA,  April 25, 2009 10:10 AM

## 2010-11-12 NOTE — Progress Notes (Signed)
Summary: Tussinex for cough  Phone Note Call from Patient   Caller: Patient Call For: wright Summary of Call: pt's spouse calling re: rx for cough. pt was seen recently and spouse is not sure where this was sent (or needs to be called in to). [facility] ph# [phone] Initial call taken by: Tivis Ringer, CNA,  April 10, 2010 12:18 PM  Follow-up for Phone Call        pt is requesting something for cough--re:  cough meds.  is this ok to call in--looks like she has tussionex on her med list.  please advise. thanks Randell Loop CMA  April 10, 2010 12:50 PM   Additional Follow-up for Phone Call Additional follow up Details #1::        tussionex ok  5ml by mouth two times a day as needed cough   #281ml 1 refill Additional Follow-up by: Storm Frisk MD,  April 14, 2010 9:35 AM    Additional Follow-up for Phone Call Additional follow up Details #2::    LMOMTCB. Need to confirm which pharmacy this needs to go to.Michel Bickers CMA  April 14, 2010 9:39 AM  Returning call, call her at [phone]. Darletta Moll  April 14, 2010 10:32 AM  The patient is aware of Tussinex RX and wants this called to [facility] on [address].  Michel Bickers CMA  April 14, 2010 10:54 AM   New/Updated Medications: Sandria Senter ER 8-10 MG/5ML LQCR (CHLORPHENIRAMINE-HYDROCODONE) 1 tsp every 12 hours as needed for cough Prescriptions: TUSSIONEX PENNKINETIC ER 8-10 MG/5ML LQCR (CHLORPHENIRAMINE-HYDROCODONE) 1 tsp every 12 hours as needed for cough  #240 x 1   Entered by:   Michel Bickers CMA   Authorized by:   Storm Frisk MD   Signed by:   Michel Bickers CMA on 04/14/2010   Method used:   Telephoned to ...       [facility] [address] (retail)       [address]       [place], [place]  [address]       Ph: [phone]       Fax: [phone]   RxID:   [id number]

## 2010-11-12 NOTE — Assessment & Plan Note (Signed)
Summary: possible kidney infection/jls   Vital Signs:  Patient Profile:   50 Years Old Female Height:     66 inches Temp:     98.5 degrees F Pulse rate:   96 / minute BP sitting:   146 / 82  (left arm) Cuff size:   large  Vitals Entered By: Gladis Riffle, RN (April 18, 2008 4:34 PM)                 Referred by:  Chase Picket PCP:  Dr. Birdie Sons  Chief Complaint:  c/o pain left flank, bilateral sides and bladder; c/o overall not feeling well, and Dysuria.  History of Present Illness:  Dysuria      [name] who presents with Dysuria.  The patient reports urinary frequency, but denies burning with urination, urgency, hematuria, and vaginal discharge.  Associated symptoms include flank pain.  The patient denies the following associated symptoms: nausea, vomiting, fever, shaking chills, abdominal pain, back pain, pelvic pain, and arthralgias.  Risk factors for urinary tract infection include diabetes and prior antibiotics.  The patient denies the following risk factors: immunosuppression, history of GU anomaly, history of pyelonephritis, pregnancy, history of STD, and analgesic abuse.      Updated Prior Medication List: PROAIR HFA 108 (90 BASE) MCG/ACT  AERS (ALBUTEROL SULFATE) 2 puffs three times a day as needed ATENOLOL-CHLORTHALIDONE 50-25 MG TABS (ATENOLOL-CHLORTHALIDONE) 1/1/2 daily BUDEPRION XL 300 MG TB24 (BUPROPION HCL) Take 1 tablet by mouth once a day EFFEXOR XR 75 MG CP24 (VENLAFAXINE HCL) Take 1 capsule by mouth once a day LORAZEPAM 1 MG TABS (LORAZEPAM) prn NOVOLOG FLEXPEN 100 UNIT/ML SOLN (INSULIN ASPART) three times a day as directed ONETOUCH ULTRA TEST  STRP (GLUCOSE BLOOD) tid POTASSIUM CHLORIDE CRYS CR 20 MEQ TBCR (POTASSIUM CHLORIDE CRYS CR) Take 1 tablet by mouth two times a day PREDNISONE 10 MG  TABS (PREDNISONE) 1 1/2 once daily ZEGERID 40-1100 MG CAPS (OMEPRAZOLE-SODIUM BICARBONATE) 2 by mouth daily CRESTOR 20 MG TABS (ROSUVASTATIN CALCIUM)  1 by mouth once daily QVAR 80 MCG/ACT  AERS (BECLOMETHASONE DIPROPIONATE) two puffs twice daily PEN NEEDLES 31G X 8 MM  MISC (INSULIN PEN NEEDLE) USE AS DIRECTED PREMARIN 0.3 MG TABS (ESTROGENS CONJUGATED) take two daily REMICADE 100 MG  SOLR (INFLIXIMAB) Injection every 6 weeks FLUTICASONE PROPIONATE 50 MCG/ACT  SUSP (FLUTICASONE PROPIONATE) 2 sprays each nostril once daily TUSSIONEX PENNKINETIC ER 8-10 MG/5ML LQCR (CHLORPHENIRAMINE-HYDROCODONE) 1 tsp two times a day as needed FUROSEMIDE 20 MG TABS (FUROSEMIDE) Take one (1) by mouth daily VICODIN 5-500 MG  TABS (HYDROCODONE-ACETAMINOPHEN) One tab every 6 hours as needed AMLODIPINE BESYLATE 10 MG  TABS (AMLODIPINE BESYLATE) Take 1 tablet by mouth once a day VITAMIN D 1000 UNIT  CAPS (CHOLECALCIFEROL) two times a day  Current Allergies (reviewed today): ! METHOTREXATE  Past Medical History:    Reviewed history from 03/27/2008 and no changes required:       Anemia-NOS       Diabetes mellitus, type II       GERD       Hyperlipidemia       Hypertension       pulmonary nodule       OSA       Vocal Cord dysfunction with recurrent cough       Asthma vs Vocal cord dysfundtion       SARCOID         Past Surgical History:    Reviewed  history from 08/21/2006 and no changes required:       Ventral Hernia Repair       Nissen Fundoplication       Cholecystectomy       Hysterectomy   Family History:    Reviewed history from 09/15/2007 and no changes required:       father-alive MI, DM       mother alive and well       uncle with SARCOID  Social History:    Reviewed history from 05/31/2007 and no changes required:       Married       Never Smoked       Regular exercise-no       Alcohol use-no    Review of Systems       no other complaints in a complete ROS    Physical Exam  General:     Well-developed,well-nourished,in no acute distress; alert,appropriate and cooperative throughout examination Head:     normocephalic and  atraumatic.   Abdomen:     no flank pain no suprapubic discomfort    Impression & Recommendations:  Problem # 1:  UTI (ICD-599.0) call for worsening of sxs Her updated medication list for this problem includes:    Septra Ds 800-160 Mg Tabs (Sulfamethoxazole-trimethoprim) .[name]... 1 by mouth twice daily  Orders: UA Dipstick w/o Micro (automated)  (81003) T-Culture, Urine ([id number])   Complete Medication List: 1)  Proair Hfa 108 (90 Base) Mcg/act Aers (Albuterol sulfate) .... 2 puffs three times a day as needed 2)  Atenolol-chlorthalidone 50-25 Mg Tabs (Atenolol-chlorthalidone) .... 1/1/2 daily 3)  Budeprion Xl 300 Mg Tb24 (Bupropion hcl) .... Take 1 tablet by mouth once a day 4)  Effexor Xr 75 Mg Cp24 (Venlafaxine hcl) .... Take 1 capsule by mouth once a day 5)  Lorazepam 1 Mg Tabs (Lorazepam) .... Prn 6)  Novolog Flexpen 100 Unit/ml Soln (Insulin aspart) .... Three times a day as directed 7)  Onetouch Ultra Test Strp (Glucose blood) .... Tid 8)  Potassium Chloride Crys Cr 20 Meq Tbcr (Potassium chloride crys cr) .... Take 1 tablet by mouth two times a day 9)  Prednisone 10 Mg Tabs (Prednisone) .[name].. 1 1/2 once daily 10)  Zegerid 40-1100 Mg Caps (Omeprazole-sodium bicarbonate) .... 2 by mouth daily 11)  Crestor 20 Mg Tabs (Rosuvastatin calcium) .[name].. 1 by mouth once daily 12)  Qvar 80 Mcg/act Aers (Beclomethasone dipropionate) .... Two puffs twice daily 13)  Pen Needles 31g X 8 Mm Misc (Insulin pen needle) .... Use as directed 14)  Premarin 0.3 Mg Tabs (Estrogens conjugated) .... Take two daily 15)  Remicade 100 Mg Solr (Infliximab) .... Injection every 6 weeks 16)  Fluticasone Propionate 50 Mcg/act Susp (Fluticasone propionate) .... 2 sprays each nostril once daily 17)  Tussionex Pennkinetic Er 8-10 Mg/53ml Lqcr (Chlorpheniramine-hydrocodone) .[name].. 1 tsp two times a day as needed 18)  Furosemide 20 Mg Tabs (Furosemide) .... Take one (1) by mouth daily 19)  Vicodin 5-500 Mg Tabs  (Hydrocodone-acetaminophen) .... One tab every 6 hours as needed 20)  Amlodipine Besylate 10 Mg Tabs (Amlodipine besylate) .... Take 1 tablet by mouth once a day 21)  Vitamin D 1000 Unit Caps (Cholecalciferol) .... Two times a day 22)  Septra Ds 800-160 Mg Tabs (Sulfamethoxazole-trimethoprim) .[name].. 1 by mouth twice daily    Prescriptions: SEPTRA DS 800-160 MG TABS (SULFAMETHOXAZOLE-TRIMETHOPRIM) 1 by mouth twice daily  #10 x 0   Entered and Authorized by:   Birdie Sons MD  Signed by:   Birdie Sons MD on 04/18/2008   Method used:   Electronically sent to ...       [facility]       [address]       [place], [place]  [address]       Ph: [phone]       Fax: [phone]   RxID:   [id number]  ]

## 2010-11-12 NOTE — Letter (Signed)
Summary: EGD Instructions  [facility]  [address] [place], [place] [address]   Phone: [phone]  Fax: [phone]       [name]    18-Oct-1960    MRN: [id number]       Procedure Day /Date:TUESDAY 08/11/10     Arrival Time: 9:30 AM     Procedure Time:10:30 AM     Location of Procedure:                    [facility] (4th Floor)   PREPARATION FOR ENDOSCOPY   On 08/11/10 THE DAY OF THE PROCEDURE:  1.   No solid foods, milk or milk products are allowed after midnight the night before your procedure.  2.   Do not drink anything colored red or purple.  Avoid juices with pulp.  No orange juice.  3.  You may drink clear liquids until 8:30 AM, which is 2 hours before your procedure.                                                                                                CLEAR LIQUIDS INCLUDE: Water Jello Ice Popsicles Tea (sugar ok, no milk/cream) Powdered fruit flavored drinks Coffee (sugar ok, no milk/cream) Gatorade Juice: apple, white grape, white cranberry  Lemonade Clear bullion, consomm, broth Carbonated beverages (any kind) Strained chicken noodle soup Hard Candy   MEDICATION INSTRUCTIONS  Unless otherwise instructed, you should take regular prescription medications with a small sip of water as early as possible the morning of your procedure.         OTHER INSTRUCTIONS  You will need a responsible adult at least 52 years of age to accompany you and drive you home.   This person must remain in the waiting room during your procedure.  Wear loose fitting clothing that is easily removed.  Leave jewelry and other valuables at home.  However, you may wish to bring a book to read or an iPod/MP3 player to listen to music as you wait for your procedure to start.  Remove all body piercing jewelry and leave at home.  Total time from sign-in until discharge is approximately 2-3 hours.  You should go home directly after  your procedure and rest.  You can resume normal activities the day after your procedure.  The day of your procedure you should not:   Drive   Make legal decisions   Operate machinery   Drink alcohol   Return to work  You will receive specific instructions about eating, activities and medications before you leave.    The above instructions have been reviewed and explained to me by   _______________________    I fully understand and can verbalize these instructions _____________________________ Date _________

## 2010-11-12 NOTE — Assessment & Plan Note (Signed)
Summary: EMERGENCY ROOM F-UP    History of Present Illness Visit Type: Follow-up Visit Primary GI MD: Yancey Flemings MD Primary Provider: Birdie Sons, MD  Requesting Provider: n/a Chief Complaint: abdominal pain epigastric, F/U ER History of Present Illness:   50 year old female with multiple significant medical problems including morbid obesity, hypertension, diabetes mellitus, hyperlipidemia, pulmonary sarcoidosis on prednisone, chronic atypical chest pain, recurrent problems with bronchospasm and respiratory failure, GERD status post fundoplication, and sleep apnea. She was last evaluated in the office October 14, 2009 regarding abdominal discomfort urgency and loose stools. See that dictation for details. She was prescribed a line and Levbid. She tells me that these therapies help her symptoms. She is seen today after a bout of acute abdominal pain and send her to the emergency room on January 18. The workup at that time was negative including normal laboratories (except for elevated glucose), urinalysis, and plain films of the abdomen. She was treated nonspecifically with pain medication and antiemetics. The discomfort resolved without recurrence. She describes the discomfort as a generalized "aching hurt" lasting for about one day. Nothing seemed to make it better or worse. She has had no recurrence since her ER visit. She does have a new complaint of intermittent solid food dysphagia pills and solids. This has occurred over the past 2-3 weeks. She has required prior upper endoscopy with esophageal dilation, most recently in 2004 which this helped similar symptoms.. She is accompanied by her husband. He participates in the encounter.   GI Review of Systems    Reports abdominal pain, acid reflux, and  dysphagia with solids.     Location of  Abdominal pain: generalized.    Denies belching, bloating, chest pain, dysphagia with liquids, heartburn, loss of appetite, nausea, vomiting, vomiting blood,  weight loss, and  weight gain.        Denies anal fissure, black tarry stools, change in bowel habit, constipation, diarrhea, diverticulosis, fecal incontinence, heme positive stool, hemorrhoids, irritable bowel syndrome, jaundice, light color stool, liver problems, rectal bleeding, and  rectal pain.    Current Medications (verified): 1)  Atenolol-Chlorthalidone 50-25 Mg Tabs (Atenolol-Chlorthalidone) .... Take 1 Tablet By Mouth Once A Day 2)  Effexor Xr 75 Mg Cp24 (Venlafaxine Hcl) .... Take 1 Tablet By Mouth Once A Day 3)  Lorazepam 1 Mg Tabs (Lorazepam) .... One Every 6 Hrs As Needed 4)  Novolog Flexpen 100 Unit/ml Soln (Insulin Aspart) .... Three Times A Day As Directed 5)  Onetouch Ultra Test  Strp (Glucose Blood) .... Tid 6)  Potassium Chloride Crys Cr 20 Meq Tbcr (Potassium Chloride Crys Cr) .[name].. 1 Three Times A Day 7)  Prednisone 1 Mg Tabs (Prednisone) .... Take 9 Mg By Mouth For 2 Weeks 8)  Zegerid 40-1100 Mg Caps (Omeprazole-Sodium Bicarbonate) .... Take 1 Tablet By Mouth Once A Day 9)  Pen Needles 31g X 8 Mm  Misc (Insulin Pen Needle) .... Use As Directed 10)  Remicade 100 Mg  Solr (Infliximab) .... Injection Every 6 Weeks 11)  Fluticasone Propionate 50 Mcg/act  Susp (Fluticasone Propionate) .... 2 Sprays Each Nostril Once Daily 12)  Furosemide 20 Mg Tabs (Furosemide) .... Take One (1) By Mouth Daily 13)  Vicodin 5-500 Mg  Tabs (Hydrocodone-Acetaminophen) .... One Tab Every 6 Hours As Needed 14)  Estrace 1 Mg Tabs (Estradiol) .... Take 1 Tablet By Mouth Once A Day 15)  Restasis 0.05 % Emul (Cyclosporine) .[name].. 1 Drop Each Eye Daily 16)  Lotemax 0.5 % Susp (Loteprednol Etabonate) .Marland KitchenMarland KitchenMarland Kitchen 1  Drop Each Eye Every 6 Hours As Needed 17)  Tussionex Pennkinetic Er 8-10 Mg/25ml Lqcr (Chlorpheniramine-Hydrocodone) .[name].. 1 Tsp Every 12 Hr As Needed For Cough, May Make You Sleepy 18)  Simvastatin 40 Mg Tabs (Simvastatin) .... Take One Tablet At Bedtime Start One Week After Completing Diflucan 19)   Magnesium Oxide 250 Mg Tabs (Magnesium Oxide) .... One Tablet By Mouth Once Daily 20)  Levbid 0.375 Mg Xr12h-Tab (Hyoscyamine Sulfate) .[name].. 1 By Mouth Two Times A Day As Needed 21)  Prednisone 1 Mg Tabs (Prednisone) .... Use For Prednisone Taper As Directed  Allergies (verified): 1)  ! Methotrexate  Past History:  Past Medical History: Reviewed history from 10/13/2009 and no changes required. EDEMA (ICD-782.3) HEADACHE (ICD-784.0) MENOPAUSAL SYNDROME (ICD-627.2) UTI (ICD-599.0) SARCOIDOSIS (ICD-135)   -CT Chest 6/08 improvement in Mediastinal LAN   -11/08 start of remecade Rx per [facility] PULMONARY NODULE (ICD-518.89)    -CT Chest 6/08 reduction in size of nodules due to Arava and Pred RX     -probable granulomas due to Sarcoidosis HYPERTENSION (ICD-401.9) HYPERLIPIDEMIA (ICD-272.4) GERD (ICD-530.81) Sleep APnea DIABETES MELLITUS, TYPE II (ICD-250.00) ANEMIA-NOS (ICD-285.9)  Fundic Gland Polyp  Past Surgical History: Reviewed history from 08/21/2006 and no changes required. Ventral Hernia Repair Nissen Fundoplication Cholecystectomy Hysterectomy  Family History: Reviewed history from 10/20/2009 and no changes required. father-alive MI, DM mother alive and well uncle with SARCOID No FH of Colon Cancer: Family History Emphysema-Mother, Father  Family History Asthma-Asthma Family History COPD -mother  Social History: Reviewed history from 10/20/2009 and no changes required. Married, live with husband Never Smoked Regular exercise-no Alcohol use-no Illicit Drug Use - no  Review of Systems       anxiety/depression, shortness of breath, joint aches. All other systems reviewed and reported as negative  Vital Signs:  Patient profile:   50 year old female Height:      66 inches Weight:      268 pounds BMI:     43.41 Pulse rate:   80 / minute Pulse rhythm:   regular BP sitting:   138 / 80  (left arm) Cuff size:   large  Vitals Entered By: June  McMurray CMA Duncan Dull) (November 11, 2009 10:17 AM)  Physical Exam  General:  Well developed,obese,well nourished, no acute distress. Head:  Normocephalic and atraumatic. Eyes:  PERRLA, no icterus. Nose:  No deformity, discharge,  or lesions. Mouth:  No deformity or lesions,  Neck:  Supple; no masses or thyromegaly. Lungs:  Clear throughout to auscultation. Heart:  Regular rate and rhythm; no murmurs, rubs,  or bruits. Abdomen:  Soft, nontender and nondistended. No masses, hepatosplenomegaly or hernias noted. Normal bowel sounds. Msk:  Symmetrical with no gross deformities. Normal posture. Pulses:  Normal pulses noted. Extremities:  No clubbing, cyanosis, edema or deformities noted. Neurologic:  Alert and  oriented x4;   Skin:  Intact without significant lesions or rashes. Psych:  Alert and cooperative. Normal mood and affect.   Impression & Recommendations:  Problem # 1:  ABDOMINAL PAIN -GENERALIZED (ICD-789.07) Episode of abdominal pain, evaluated in the emergency room January 18. Negative workup. No recurrence. No evidence for obstruction. Multiple abdominal surgeries previously. At this point, expectant management.  Problem # 2:  DYSPHAGIA UNSPECIFIED (ICD-787.20) intermittent solid food and pill dysphagia 3 weeks duration. Suspect recurrent peptic stricture. She has benefited from endoscopy with esophageal dilation previously. Patient is at HIGH RISK GIVEN HER PULMONARY PROBLEMS AND BODY HABITUS. As well, we will need to  adjust her diabetic medications for the exam.   Plan #1. Upper endoscopy with esophageal dilation. The nature of the procedure as well as the risks, benefits, and alternatives were reviewed. She understood and agreed to proceed #2. Continue Zegerid #3. Hold diabetic medications the morning of the examination. We will monitor her blood sugar immediately before and after her procedure.  Problem # 3:  GERD (ICD-530.81) GERD status post fundoplication. Now with  recurrent dysphagia likely due to recurrent stricture.  Plan: #1. The reflux precautions with attention to weight loss #2. Continue Zegerid  Other Orders: EGD (EGD)  Patient Instructions: 1)  EGD [facility] 11/17/09 [facility] 9:00 am  2)  Hold Insulin evening before procedure and morning of procedure. 3)  Insulin instructions given to patient. 4)  Upper Endoscopy brochure given.  5)  The medication list was reviewed and reconciled.  All changed / newly prescribed medications were explained.  A complete medication list was provided to the patient / caregiver. 6)  Copy: Dr. Birdie Sons

## 2010-11-12 NOTE — Letter (Signed)
Summary: [facility] medical note  [facility] medical note   Imported By: Kassie Mends 09/28/2007 13:30:50  _____________________________________________________________________  External Attachment:    Type:   Image     Comment:   Reading medical note

## 2010-11-12 NOTE — Progress Notes (Signed)
Summary: NEED RX CALLED IN    Phone Note Call from Patient Call back at [phone]   Caller: PATIENT HUSBAND TRIAGE MESSAGE Call For: Birdie Sons MD Summary of Call: WAS IN LAST WEEK AND GOT NEW RX FOR ANTIBIOTIC AND SOME SAMPLES WAS TO CALL IN PREMARIN  [facility] [address]  WHEN THEY WENT [facility] THE ONLY THING THERE WAS ANTIBIOTIC Initial call taken by: Roselle Locus,  September 18, 2007 2:01 PM  Follow-up for Phone Call        Rx done electronically.Patient notified.  Follow-up by: Gladis Riffle, RN,  September 18, 2007 5:57 PM

## 2010-11-12 NOTE — Assessment & Plan Note (Signed)
Summary: er fup//ccm   Vital Signs:  Patient profile:   50 year old female Weight:      240 pounds BMI:     38.88 Pulse rate:   68 / minute Pulse rhythm:   regular BP sitting:   126 / 78  (left arm) Cuff size:   large  Vitals Entered By: [name], RN (March 16, 2010 10:20 AM) CC: ER f/u for painful bruised area R groin.   Primary Care Provider:  Birdie Sons, MD   CC:  ER f/u for painful bruised area R groin.[name]  History of Present Illness: painful groin---saturday night located right groin---says a deep ache went to ED---reviewed CT  reviewed labs---note WBC and abnormal UA  no dysuria---she does admit to some increased urinary frequency  All other systems reviewed and were negative   Allergies: 1)  ! Methotrexate  Physical Exam  General:  alert and well-developed.   Head:  normocephalic and atraumatic.   Neck:  supple.   Chest Wall:  marked tenderness costochondral joints 2-7 Lungs:  normal respiratory effort and no intercostal retractions.   Heart:  normal rate and regular rhythm.   Abdomen:  soft and non-tender.   Skin:  2x7cm bruise--right groin---appears to be clearing Cervical Nodes:  no anterior cervical adenopathy and no posterior cervical adenopathy.     Impression & Recommendations:  Problem # 1:  LEG PAIN (ICD-729.5) has an unexplained groin bruise---likely the cause of her sxs I don't think any further eval necessary i have reviewed CT---no pathology identified  Problem # 2:  UTI (ICD-599.0) UA abnormal her sxs are minimal ED reports states culture ordered---I don't see results order culture Orders: T-Culture, Urine ([id number])  Complete Medication List: 1)  Atenolol-chlorthalidone 50-25 Mg Tabs (Atenolol-chlorthalidone) .... Take 1 tablet by mouth once a day 2)  Effexor Xr 75 Mg Cp24 (Venlafaxine hcl) .... Take 1 tablet by mouth once a day 3)  Lorazepam 1 Mg Tabs (Lorazepam) .... One every 6 hrs as needed 4)  Zegerid 40-1100 Mg  Caps (Omeprazole-sodium bicarbonate) .... Take 1 tablet by mouth once a day 5)  Remicade 100 Mg Solr (Infliximab) .... Injection every 6 weeks 6)  Omnaris 50 Mcg/act Susp (Ciclesonide) .... One spray each nostril once daily 7)  Vicodin 5-500 Mg Tabs (Hydrocodone-acetaminophen) .... One tab every 6 hours as needed 8)  Estrace 1 Mg Tabs (Estradiol) .... Take 1 tablet by mouth once a day 9)  Restasis 0.05 % Emul (Cyclosporine) .[name].. 1 drop each eye daily 10)  Lotemax 0.5 % Susp (Loteprednol etabonate) .[name].. 1 drop each eye every 6 hours as needed 11)  Simvastatin 40 Mg Tabs (Simvastatin) .... Take one tablet at bedtime start one week after completing diflucan 12)  Levbid 0.375 Mg Xr12h-tab (Hyoscyamine sulfate) .[name].. 1 by mouth two times a day as needed 13)  Prednisone 5 Mg Tabs (Prednisone) .... Take 1 tablet by mouth every other day 14)  Cpap 7 Cm  15)  Nystatin-triamcinolone 100000-0.1 Unit/gm-% Crea (Nystatin-triamcinolone) .... Apply two times a day to affected area 16)  Hydromet 5-1.5 Mg/65ml Syrp (Hydrocodone-homatropine) .[name].. 1 tsp three times a day as needed cough 17)  Valtrex 1 Gm Tabs (Valacyclovir hcl) .... Take 1 tablet by mouth three times a day

## 2010-11-12 NOTE — Assessment & Plan Note (Signed)
Summary: Pulmonary OV work in    Copy to:  Adella Hare Primary Provider/Referring Provider:  Leeanne Deed  CC:  Follow up.  Pt c/o productive cough with yellow mucus, wheezing, chest tightness, neck tenderness x2days.  denies chills, and fever.[name]  History of Present Illness:  50 year old, white female with chronic pulmonary sarcoidosis.  This is been manifested by cyclic cough and mediastinal adenopathy with associated chest pain.  There are also stable pulmonary nodules.  The patient has been intolerant of systemic corticosteroids.  She also had adverse side effects to Arava(leflunamide).    On this basis we referred the patient in November 2008  to Dr. Azzie Roup,  for Remicade injections.  There has been a positive response to Rx.  December 18, 2008-now on pred 5/d  on remecade every 6weeks The pts cough is better.  There is some chest pain.  Pt notes some sinus pressure.  Some postnasal drip.  May 01, 2009 --Presents for an acute office visit. Admitted 04/27/2009- 04/30/2009 for  Sarcoidosis with acute exacerbation.   She was admitted w/ increased cough, dyspnea and weakness. CT chestt was     negative for PE and showed no intrathoracic abnormalities.   Sputum cx was neg. She was treated w/ steorids, aggressive pulmonary toilet. She did have a lactic acidosis.  w/ lactic acid level as high as 8, last check showed at 2.6. Felt to be from r hyperventilation at the time of admission. She did improve but cough and tightness did not totally resolve. Today she says she has been having dry cough. Discharge summary says pred 10mg  however she is on 5mg  once daily. Dyspnea is worse in heat (temps >90 outside). Denies orthopnea, hemoptysis, fever, n/v/d, edema.  Pain on inspiration and with cough.     May 07, 2009 10:03  at last ov with NP pt was seen post hosp: Stop fish oil.  Change QVAR to Symbicort 160.4.52mcg 2 puffs two times a day (brush, rinse and gargle after use) Increase  Prednisone 20mg  once daily for 1 week, then hold at 10mg  once daily until next office viist.  Mucinex DM two times a day as needed cough/congestion Still on prednisone 20mg /d.  Still with dry cough.  Still with heartburn.  Has dysphagia and hoarseness. No loose stools.  Still dyspneic at rest.   May 30, 2009 10:31 AM Still with cough that is dry.  Cough is much less than one month ago with hosp admit. To have endo on 8/24 Still with breakthrough heartburn Pt denies any significant sore throat, nasal congestion or excess secretions, fever, chills, sweats, unintended weight loss, pleurtic or exertional chest pain, orthopnea PND, or leg swelling Pt denies any increase in rescue therapy over baseline, denies waking up needing it or having any early am or nocturnal exacerbations of coughing/wheezing/or dyspnea. There are not any alleviating or precipitating factors noted.  The symptoms do not generally fluctuate.  June 10, 2009 2:50 PM At last ov we reduced pred to 15mg /d Still with pndrip. Pt now with cough is dry and occ yellow. There is some pharygeal drainage The pt is on generic flonase Dyspnea persists    Current Medications (verified): 1)  Atenolol-Chlorthalidone 50-25 Mg Tabs (Atenolol-Chlorthalidone) .... Take 1 Tablet By Mouth Once A Day 2)  Budeprion Xl 300 Mg Tb24 (Bupropion Hcl) .... Take 1 Tablet By Mouth Once A Day 3)  Effexor Xr 75 Mg Cp24 (Venlafaxine Hcl) .... Take 1 Capsule By Mouth Two Times A Day 4)  Lorazepam 1 Mg Tabs (Lorazepam) .... One Every 6 Hrs As Needed 5)  Novolog Flexpen 100 Unit/ml Soln (Insulin Aspart) .... Three Times A Day As Directed 6)  Onetouch Ultra Test  Strp (Glucose Blood) .... Tid 7)  Potassium Chloride Crys Cr 20 Meq Tbcr (Potassium Chloride Crys Cr) .[name].. 1 Three Times A Day 8)  Prednisone 10 Mg Tabs (Prednisone) .[name].. 1 in Am   1/2 in Pm 9)  Zegerid 40-1100 Mg Caps (Omeprazole-Sodium Bicarbonate) .... One Twice Daily 10)  Crestor 20 Mg  Tabs (Rosuvastatin Calcium) .[name].. 1 By Mouth Once Daily 11)  Pen Needles 31g X 8 Mm  Misc (Insulin Pen Needle) .... Use As Directed 12)  Remicade 100 Mg  Solr (Infliximab) .... Injection Every 6 Weeks 13)  Fluticasone Propionate 50 Mcg/act  Susp (Fluticasone Propionate) .... 2 Sprays Each Nostril Once Daily 14)  Furosemide 20 Mg Tabs (Furosemide) .... Take One (1) By Mouth Daily 15)  Vicodin 5-500 Mg  Tabs (Hydrocodone-Acetaminophen) .... One Tab Every 6 Hours As Needed 16)  Vitamin D 1000 Unit  Caps (Cholecalciferol) .... Two Times A Day 17)  Estrace 1 Mg Tabs (Estradiol) .... Take 1 Tablet By Mouth Once A Day 18)  Slow Release Iron 47.5 Mg Cr-Tabs (Ferrous Sulfate) .... Once Daily 19)  Multiple Vitamin  Tabs (Multiple Vitamin) .... Once Daily 20)  Tussionex Pennkinetic Er 8-10 Mg/39ml Lqcr (Chlorpheniramine-Hydrocodone) .... Take 1 Teaspoon Every 12 Hrs As Needed Cough 21)  Restasis 0.05 % Emul (Cyclosporine) .[name].. 1 Drop Each Eye Daily 22)  Lotemax 0.5 % Susp (Loteprednol Etabonate) .[name].. 1 Drop Each Eye Every 6 Hours As Needed 23)  Utac 500-500 Mg Tabs (Methenamine Mand-Sod Phosphate) .... Two Times A Day 24)  Zantac 150 Mg Tabs (Ranitidine Hcl) .... One Daily 25)  Tessalon Perles 100 Mg  Caps (Benzonatate) .... One To Two By Mouth 3-4 Times Daily  Allergies (verified): 1)  ! Methotrexate  Past History:  Past medical, surgical, family and social histories (including risk factors) reviewed, and no changes noted (except as noted below).  Past Medical History: Reviewed history from 04/21/2009 and no changes required. EDEMA (ICD-782.3) HEADACHE (ICD-784.0) MENOPAUSAL SYNDROME (ICD-627.2) UTI (ICD-599.0) SARCOIDOSIS (ICD-135)   -CT Chest 6/08 improvement in Mediastinal LAN   -11/08 start of remecade Rx per [facility] PULMONARY NODULE (ICD-518.89)    -CT Chest 6/08 reduction in size of nodules due to Arava and Pred RX     -probable granulomas due to Sarcoidosis HYPERTENSION  (ICD-401.9) HYPERLIPIDEMIA (ICD-272.4) GERD (ICD-530.81) Sleep APnea DIABETES MELLITUS, TYPE II (ICD-250.00) ANEMIA-NOS (ICD-285.9)    Past Surgical History: Reviewed history from 08/21/2006 and no changes required. Ventral Hernia Repair Nissen Fundoplication Cholecystectomy Hysterectomy  Family History: Reviewed history from 05/12/2009 and no changes required. father-alive MI, DM mother alive and well uncle with SARCOID No FH of Colon Cancer:  Social History: Reviewed history from 05/12/2009 and no changes required. Married Never Smoked Regular exercise-no Alcohol use-no Illicit Drug Use - no  Review of Systems       The patient complains of shortness of breath with activity, shortness of breath at rest, productive cough, non-productive cough, and nasal congestion/difficulty breathing through nose.  The patient denies coughing up blood, chest pain, irregular heartbeats, acid heartburn, indigestion, loss of appetite, weight change, abdominal pain, difficulty swallowing, sore throat, tooth/dental problems, headaches, sneezing, itching, ear ache, anxiety, depression, hand/feet swelling, joint stiffness or pain, rash, change in color of mucus, and fever.    Vital  Signs:  Patient profile:   50 year old female Height:      66 inches Weight:      276.25 pounds BMI:     44.75 O2 Sat:      97 % on Room air Temp:     97.5 degrees F oral Pulse rate:   83 / minute BP sitting:   130 / 82  (right arm) Cuff size:   large  Vitals Entered By: Gweneth Dimitri RN (June 10, 2009 2:43 PM)  O2 Flow:  Room air CC: Follow up.  Pt c/o productive cough with yellow mucus, wheezing, chest tightness, neck tenderness x2days.  denies chills, fever. Comments Medications reviewed with patient Gweneth Dimitri RN  June 10, 2009 2:46 PM    Physical Exam  Additional Exam:   GEN: A/Ox3; pleasant , NAD, obese female.  HEENT:  [place]/AT, , EACs-clear, TMs-wnl, NOSE-purulence in nares,  nasal edema  THROAT-clear NECK:  Supple w/ fair ROM; no JVD; normal carotid impulses w/o bruits; no thyromegaly or nodules palpated; no lymphadenopathy. RESP  Coarse BS w/ no wheeizng, has some mild upper airway psuedowheezing on forced exp.  CARD:  RRR, no m/r/g   GI:   Soft & nt; nml bowel sounds; no organomegaly or masses detected. Musco: Warm bil,  no calf tenderness edema, clubbing, pulses intact Neuro: intact w/ no focal deficits.    Impression & Recommendations:  Problem # 1:  OTHER ACUTE SINUSITIS (ICD-461.8) Assessment Deteriorated Acute sinusitis with bronchtic flare plan: augmentin for 7 days Depomedrol injection  Her updated medication list for this problem includes:    Fluticasone Propionate 50 Mcg/act Susp (Fluticasone propionate) .[name]... 2 sprays each nostril once daily    Tussionex Pennkinetic Er 8-10 Mg/40ml Lqcr (Chlorpheniramine-hydrocodone) .[name]... Take 1 teaspoon every 12 hrs as needed cough    Tessalon Perles 100 Mg Caps (Benzonatate) ..... One to two by mouth 3-4 times daily    Augmentin 875-125 Mg Tabs (Amoxicillin-pot clavulanate) ..... By mouth twice daily  Orders: Est. Patient Level IV ([address])  Problem # 2:  CANDIDIASIS (ICD-112.9) Assessment: Unchanged Ongoing oral candidiasis plan diflucan for 5 days Orders: Est. Patient Level IV ([address])  Medications Added to Medication List This Visit: 1)  Augmentin 875-125 Mg Tabs (Amoxicillin-pot clavulanate) .... By mouth twice daily 2)  Diflucan 100 Mg Tabs (Fluconazole) .... Take two by mouth times one dose then one daily until gone  Complete Medication List: 1)  Atenolol-chlorthalidone 50-25 Mg Tabs (Atenolol-chlorthalidone) .... Take 1 tablet by mouth once a day 2)  Budeprion Xl 300 Mg Tb24 (Bupropion hcl) .... Take 1 tablet by mouth once a day 3)  Effexor Xr 75 Mg Cp24 (Venlafaxine hcl) .... Take 1 capsule by mouth two times a day 4)  Lorazepam 1 Mg Tabs (Lorazepam) .... One every 6 hrs as needed 5)  Novolog Flexpen 100  Unit/ml Soln (Insulin aspart) .... Three times a day as directed 6)  Onetouch Ultra Test Strp (Glucose blood) .... Tid 7)  Potassium Chloride Crys Cr 20 Meq Tbcr (Potassium chloride crys cr) .[name].. 1 three times a day 8)  Prednisone 10 Mg Tabs (Prednisone) .[name].. 1 in am   1/2 in pm 9)  Zegerid 40-1100 Mg Caps (Omeprazole-sodium bicarbonate) .... One twice daily 10)  Crestor 20 Mg Tabs (Rosuvastatin calcium) .[name].. 1 by mouth once daily 11)  Pen Needles 31g X 8 Mm Misc (Insulin pen needle) .... Use as directed 12)  Remicade 100 Mg Solr (Infliximab) .... Injection every 6 weeks  13)  Fluticasone Propionate 50 Mcg/act Susp (Fluticasone propionate) .... 2 sprays each nostril once daily 14)  Furosemide 20 Mg Tabs (Furosemide) .... Take one (1) by mouth daily 15)  Vicodin 5-500 Mg Tabs (Hydrocodone-acetaminophen) .... One tab every 6 hours as needed 16)  Vitamin D 1000 Unit Caps (Cholecalciferol) .... Two times a day 17)  Estrace 1 Mg Tabs (Estradiol) .... Take 1 tablet by mouth once a day 18)  Slow Release Iron 47.5 Mg Cr-tabs (Ferrous sulfate) .... Once daily 19)  Multiple Vitamin Tabs (Multiple vitamin) .... Once daily 20)  Tussionex Pennkinetic Er 8-10 Mg/45ml Lqcr (Chlorpheniramine-hydrocodone) .... Take 1 teaspoon every 12 hrs as needed cough 21)  Restasis 0.05 % Emul (Cyclosporine) .[name].. 1 drop each eye daily 22)  Lotemax 0.5 % Susp (Loteprednol etabonate) .[name].. 1 drop each eye every 6 hours as needed 23)  Utac 500-500 Mg Tabs (Methenamine mand-sod phosphate) .... Two times a day 24)  Zantac 150 Mg Tabs (Ranitidine hcl) .... One daily 25)  Tessalon Perles 100 Mg Caps (Benzonatate) .... One to two by mouth 3-4 times daily 26)  Augmentin 875-125 Mg Tabs (Amoxicillin-pot clavulanate) .... By mouth twice daily 27)  Diflucan 100 Mg Tabs (Fluconazole) .... Take two by mouth times one dose then one daily until gone  Other Orders: Depo- Medrol 80mg  (J1040) Depo- Medrol 40mg  (J1030) Admin of Therapeutic  Inj  intramuscular or subcutaneous ([address])  Patient Instructions: 1)  A depomedrol injection will be given  2)  Take Augmentin 875mg  twice daily for 7 days 3)  Use flonase twice a day two sprays each nostril for 4 days then reduce to once daily 4)  Stay on prednisone 15mg  daily 5)  Use the NeilMed nasal rinse at least daily washing out both nares thoroughly.  Place one packet of Sinus Wash ingredients into the nasal wash bottle then fill to the dotted line with lukewarm tap water.  Lean over the sink and rinse each nostril out thoroughly and avoid letting the rinse go into the throat.   6)  Diflucan two first day then one a day until gone 7)  Return one month Prescriptions: DIFLUCAN 100 MG TABS (FLUCONAZOLE) Take two by mouth times one dose then one daily until gone  #7 x 0   Entered and Authorized by:   Storm Frisk MD   Signed by:   Storm Frisk MD on 06/10/2009   Method used:   Electronically to        [facility] (retail)       [address]       [place], [place]  [address]       Ph: [phone]       Fax: [phone]   RxID:   [id number] AUGMENTIN 875-125 MG  TABS (AMOXICILLIN-POT CLAVULANATE) By mouth twice daily  #14 x 0   Entered and Authorized by:   Storm Frisk MD   Signed by:   Storm Frisk MD on 06/10/2009   Method used:   Electronically to        [facility] (retail)       [address]       [place], [place]  [address]       Ph: [phone]       Fax: [phone]   RxID:   [id number]    Medication Administration  Injection # 1:  Medication: Depo- Medrol 80mg     Diagnosis: SARCOIDOSIS (ICD-135)    Route: IM    Site: RUOQ gluteus    Exp Date: 08/12/2011    Lot #: [id number]    Mfr: Pharmacia    Patient tolerated injection without complications    Given by: Vernie Murders (June 10, 2009 3:04 PM)  Injection # 2:    Medication: Depo- Medrol 40mg      Diagnosis: SARCOIDOSIS (ICD-135)    Route: IM    Site: RUOQ gluteus    Exp Date: 08/12/2011    Lot #: [id number]    Mfr: Pharmacia    Patient tolerated injection without complications    Given by: Vernie Murders (June 10, 2009 3:06 PM)  Orders Added: 1)  Est. Patient Level IV [03474] 2)  Depo- Medrol 80mg  [J1040] 3)  Depo- Medrol 40mg  [J1030] 4)  Admin of Therapeutic Inj  intramuscular or subcutaneous [25956]

## 2010-11-12 NOTE — Assessment & Plan Note (Signed)
Summary: Pulmonary OV   Copy to:  Adella Hare Primary Avaleen Brownley/Referring Anselm Aumiller:  Leeanne Deed  CC:  1 mo follow up.  states breathing is doing well overall.  [name]  History of Present Illness:  50 year old, white female with chronic pulmonary sarcoidosis.  This is been manifested by cyclic cough and mediastinal adenopathy with associated chest pain.  There are also stable pulmonary nodules.  The patient has been intolerant of systemic corticosteroids.  She also had adverse side effects to Arava(leflunamide).    On this basis we referred the patient in November 2008  to Dr. Azzie Roup,  for Remicade injections.  There has been a positive response to Rx.   August 01, 2009 Last visit , Depo medrol shot,  Augmentin 875mg  twice daily for 7 days , Use flonase twice a day two sprays each nostril for 4 days then reduce to once daily Stay on prednisone 15mg  daily diflucan for 5 days Now not as much cough,  not as dyspneic.  Some pndrip and has hoarseness  August 05, 2009--Returns for follow up and prod cough with yellow mucus, increased SOB, some wheezing x2days. Last visit Prednisone decreased to 10mg  once daily . breathing has worsened. for last 2 days. Denies chest pain,  , orthopnea, hemoptysis, fever, n/v/d, edema, headache.   September 08, 2009 11:01 AM The pt has been ill for two - three weeks .  [name] tired and achy.  There is excess daytime somnolence. The pt saw [facility] and rx: 1)  Increase Prednisone 40mg  once daily for 3 days, then 30mg  once daily for 3 days, then 20mg  once daily for 1 week, then 15mg  once daily for week then 10mg  once daily and hold.  2)  Mucinex DM two times a day as needed cough/congestion 3)  Tessalon three times a day as needed cough 4)  Tussionex 1 tsp every 12 hr as needed couigh, may make you sleepy.  The pt then developed yeast in mouth The cough is now dry. The pt  is  using mucinex DM and tussionex and is tired with this. No wheeze. The pt is   now down to 10mg /d pred The pt notes sl amount of heartburn The pt has headaches and sinus pressure and post nasal drip. The mucous is clear.     Preventive Screening-Counseling & Management  Alcohol-Tobacco     Smoking Status: never  Current Medications (verified): 1)  Atenolol-Chlorthalidone 50-25 Mg Tabs (Atenolol-Chlorthalidone) .... Take 1 Tablet By Mouth Once A Day 2)  Effexor Xr 75 Mg Cp24 (Venlafaxine Hcl) .... Take 1 Tablet By Mouth Once A Day 3)  Lorazepam 1 Mg Tabs (Lorazepam) .... One Every 6 Hrs As Needed 4)  Novolog Flexpen 100 Unit/ml Soln (Insulin Aspart) .... Three Times A Day As Directed 5)  Onetouch Ultra Test  Strp (Glucose Blood) .... Tid 6)  Potassium Chloride Crys Cr 20 Meq Tbcr (Potassium Chloride Crys Cr) .[name].. 1 Three Times A Day 7)  Prednisone 10 Mg Tabs (Prednisone) .... Take As Directed. 8)  Zegerid 40-1100 Mg Caps (Omeprazole-Sodium Bicarbonate) .... Take 1 Tablet By Mouth Once A Day 9)  Pen Needles 31g X 8 Mm  Misc (Insulin Pen Needle) .... Use As Directed 10)  Remicade 100 Mg  Solr (Infliximab) .... Injection Every 6 Weeks 11)  Fluticasone Propionate 50 Mcg/act  Susp (Fluticasone Propionate) .... 2 Sprays Each Nostril Once Daily 12)  Furosemide 20 Mg Tabs (Furosemide) .... Take One (1) By Mouth Daily 13)  Vicodin  5-500 Mg  Tabs (Hydrocodone-Acetaminophen) .... One Tab Every 6 Hours As Needed 14)  Estrace 1 Mg Tabs (Estradiol) .... Take 1 Tablet By Mouth Once A Day 15)  Restasis 0.05 % Emul (Cyclosporine) .[name].. 1 Drop Each Eye Daily 16)  Lotemax 0.5 % Susp (Loteprednol Etabonate) .[name].. 1 Drop Each Eye Every 6 Hours As Needed 17)  Utac 500-500 Mg Tabs (Methenamine Mand-Sod Phosphate) .... Two Times A Day 18)  Tussionex Pennkinetic Er 8-10 Mg/22ml Lqcr (Chlorpheniramine-Hydrocodone) .[name].. 1 Tsp Every 12 Hr As Needed For Cough, May Make You Sleepy  Allergies (verified): 1)  ! Methotrexate  Past History:  Past medical, surgical, family and social histories  (including risk factors) reviewed, and no changes noted (except as noted below).  Past Medical History: Reviewed history from 04/21/2009 and no changes required. EDEMA (ICD-782.3) HEADACHE (ICD-784.0) MENOPAUSAL SYNDROME (ICD-627.2) UTI (ICD-599.0) SARCOIDOSIS (ICD-135)   -CT Chest 6/08 improvement in Mediastinal LAN   -11/08 start of remecade Rx per [facility] PULMONARY NODULE (ICD-518.89)    -CT Chest 6/08 reduction in size of nodules due to Arava and Pred RX     -probable granulomas due to Sarcoidosis HYPERTENSION (ICD-401.9) HYPERLIPIDEMIA (ICD-272.4) GERD (ICD-530.81) Sleep APnea DIABETES MELLITUS, TYPE II (ICD-250.00) ANEMIA-NOS (ICD-285.9)    Past Surgical History: Reviewed history from 08/21/2006 and no changes required. Ventral Hernia Repair Nissen Fundoplication Cholecystectomy Hysterectomy  Family History: Reviewed history from 05/12/2009 and no changes required. father-alive MI, DM mother alive and well uncle with SARCOID No FH of Colon Cancer:  Social History: Reviewed history from 05/12/2009 and no changes required. Married Never Smoked Regular exercise-no Alcohol use-no Illicit Drug Use - no  Review of Systems       The patient complains of shortness of breath with activity, non-productive cough, acid heartburn, indigestion, and nasal congestion/difficulty breathing through nose.  The patient denies shortness of breath at rest, productive cough, coughing up blood, chest pain, irregular heartbeats, loss of appetite, weight change, abdominal pain, difficulty swallowing, sore throat, tooth/dental problems, headaches, sneezing, itching, ear ache, anxiety, depression, hand/feet swelling, joint stiffness or pain, rash, change in color of mucus, and fever.    Vital Signs:  Patient profile:   50 year old female Height:      66 inches Weight:      278.25 pounds BMI:     45.07 O2 Sat:      97 % on Room air Temp:     97.6 degrees F oral Pulse  rate:   77 / minute BP sitting:   134 / 82  (left arm) Cuff size:   large  Vitals Entered By: Gweneth Dimitri RN (September 08, 2009 10:50 AM)  O2 Flow:  Room air CC: 1 mo follow up.  states breathing is doing well overall.   Comments Medications reviewed with patient Gweneth Dimitri RN  September 08, 2009 10:50 AM    Physical Exam  Additional Exam:   GEN: A/Ox3; pleasant , NAD, obese female.  HEENT:  [place]/AT, , EACs-clear, TMs-wnl, NOSE-purulence in nares,  nasal edema THROAT-clear NECK:  Supple w/ fair ROM; no JVD; normal carotid impulses w/o bruits; no thyromegaly or nodules palpated; no lymphadenopathy. RESP  Coarse BS w/ no wheeizng, has some mild upper airway psuedowheezing on forced exp.  CARD:  RRR, no m/r/g   GI:   Soft & nt; nml bowel sounds; no organomegaly or masses detected. [place]co: Warm bil,  no calf tenderness edema, clubbing, pulses intact Neuro: intact w/ no focal deficits.  Impression & Recommendations:  Problem # 1:  SLEEP APNEA (ICD-780.57) Assessment Deteriorated This pt exhibits progressive symptoms of sleep apnea. There is nasal drying but no active sinus infection. The narcotics in tussionex are likely exhibiting depression.  GERD is playing a role here as well.  plan A sleep study will be scheduled Use Lloyd Huger med sinus rinse twice a day to moisturize the sinuses No change in medications Try to minimize Tussionex use Return one month  Follow Reflux diet  Orders: Est. Patient Level IV (16109) Sleep Disorder Referral (Sleep Disorder)  Complete Medication List: 1)  Atenolol-chlorthalidone 50-25 Mg Tabs (Atenolol-chlorthalidone) .... Take 1 tablet by mouth once a day 2)  Effexor Xr 75 Mg Cp24 (Venlafaxine hcl) .... Take 1 tablet by mouth once a day 3)  Lorazepam 1 Mg Tabs (Lorazepam) .... One every 6 hrs as needed 4)  Novolog Flexpen 100 Unit/ml Soln (Insulin aspart) .... Three times a day as directed 5)  Onetouch Ultra Test Strp (Glucose blood) ....  Tid 6)  Potassium Chloride Crys Cr 20 Meq Tbcr (Potassium chloride crys cr) .[name].. 1 three times a day 7)  Prednisone 10 Mg Tabs (Prednisone) .... Take as directed. 8)  Zegerid 40-1100 Mg Caps (Omeprazole-sodium bicarbonate) .... Take 1 tablet by mouth once a day 9)  Pen Needles 31g X 8 Mm Misc (Insulin pen needle) .... Use as directed 10)  Remicade 100 Mg Solr (Infliximab) .... Injection every 6 weeks 11)  Fluticasone Propionate 50 Mcg/act Susp (Fluticasone propionate) .... 2 sprays each nostril once daily 12)  Furosemide 20 Mg Tabs (Furosemide) .... Take one (1) by mouth daily 13)  Vicodin 5-500 Mg Tabs (Hydrocodone-acetaminophen) .... One tab every 6 hours as needed 14)  Estrace 1 Mg Tabs (Estradiol) .... Take 1 tablet by mouth once a day 15)  Restasis 0.05 % Emul (Cyclosporine) .[name].. 1 drop each eye daily 16)  Lotemax 0.5 % Susp (Loteprednol etabonate) .[name].. 1 drop each eye every 6 hours as needed 17)  Utac 500-500 Mg Tabs (Methenamine mand-sod phosphate) .... Two times a day 18)  Tussionex Pennkinetic Er 8-10 Mg/22ml Lqcr (Chlorpheniramine-hydrocodone) .[name].. 1 tsp every 12 hr as needed for cough, may make you sleepy 19)  Diflucan 100 Mg Tabs (Fluconazole) .... Take two now x one dose and then take 1 tablet by mouth once a day until bottle empty 20)  Simvastatin 40 Mg Tabs (Simvastatin) .... Take one tablet at bedtime start one week after completing diflucan  Patient Instructions: 1)  A sleep study will be scheduled 2)  Use Lloyd Huger med sinus rinse twice a day to moisturize the sinuses 3)  No change in medications 4)  Try to minimize Tussionex use 5)  Return one month  6)  Follow Reflux diet

## 2010-11-12 NOTE — Procedures (Signed)
Summary: EGD/[facility]  EGD/[facility]   Imported By: Sherian Rein 10/15/2009 09:27:39  _____________________________________________________________________  External Attachment:    Type:   Image     Comment:   External Document

## 2010-11-12 NOTE — Assessment & Plan Note (Signed)
Summary: osa per pew   Visit Type:  Initial Consult Copy to:  Dr. Delford Field Primary Provider/Referring Provider:  Birdie Sons, MD   CC:  Pt  here for sleep consult.  History of Present Illness:  50 year old, white female with chronic pulmonary sarcoidosis.  This is been manifested by cyclic cough and mediastinal adenopathy with associated chest pain.  There are also stable pulmonary nodules.  She has been intolerant of systemic corticosteroids &  had adverse side effects to Arava(leflunamide).    On this basis we referred the patient in November 2008  to Dr. Azzie Roup,  for Remicade injections.  There has been a positive response to Rx.  October 20, 2009 5:17 PM  Sleep consult Epworth Sleepiness Score 9/24. Sleep latency x 30 mins, 2 awakenings for BR visits, no post void latency, snoring +, occasionally wakes herself up, no witnessed apneas, Wakes up at 7 A feeling tired, dryness +, occ. headaches.  Gained 10 lbs in the last 2 years. Reviewed overnight PSG >> moderate obstructive sleep apnea with predominant hypopneas & RERAs, RDI 25/h,AHI 15/h with nadir desatn to 89%, no arrythmias, PLms. There is no history suggestive of cataplexy, sleep paralysis or parasomnias  She had severe GERD s/p fundoplication in 2001.    What time do you typically go to bed?(between what hours): 10:00-11:00  How long does it take you to fall asleep?  How many times during the night do you wake up? 2  What time do you get out of bed to start your day? 7:00  Do you drive or operate heavy machinery in your occupation? no-disability  How much has your weight changed (up or down) over the past two years? (in pounds): 10 pounds  Have you ever had a sleep study before?  If yes,when and where: no  Do you currently use CPAP ? If so , at what pressure? no  Do you wear oxygen at any time? If yes, how many liters per minute? no Current Medications (verified): 1)  Atenolol-Chlorthalidone 50-25 Mg Tabs  (Atenolol-Chlorthalidone) .... Take 1 Tablet By Mouth Once A Day 2)  Effexor Xr 75 Mg Cp24 (Venlafaxine Hcl) .... Take 1 Tablet By Mouth Once A Day 3)  Lorazepam 1 Mg Tabs (Lorazepam) .... One Every 6 Hrs As Needed 4)  Novolog Flexpen 100 Unit/ml Soln (Insulin Aspart) .... Three Times A Day As Directed 5)  Onetouch Ultra Test  Strp (Glucose Blood) .... Tid 6)  Potassium Chloride Crys Cr 20 Meq Tbcr (Potassium Chloride Crys Cr) .[name].. 1 Three Times A Day 7)  Prednisone 10 Mg Tabs (Prednisone) .... Take As Directed. 8)  Zegerid 40-1100 Mg Caps (Omeprazole-Sodium Bicarbonate) .... Take 1 Tablet By Mouth Once A Day 9)  Pen Needles 31g X 8 Mm  Misc (Insulin Pen Needle) .... Use As Directed 10)  Remicade 100 Mg  Solr (Infliximab) .... Injection Every 6 Weeks 11)  Fluticasone Propionate 50 Mcg/act  Susp (Fluticasone Propionate) .... 2 Sprays Each Nostril Once Daily 12)  Furosemide 20 Mg Tabs (Furosemide) .... Take One (1) By Mouth Daily 13)  Vicodin 5-500 Mg  Tabs (Hydrocodone-Acetaminophen) .... One Tab Every 6 Hours As Needed 14)  Estrace 1 Mg Tabs (Estradiol) .... Take 1 Tablet By Mouth Once A Day 15)  Restasis 0.05 % Emul (Cyclosporine) .[name].. 1 Drop Each Eye Daily 16)  Lotemax 0.5 % Susp (Loteprednol Etabonate) .[name].. 1 Drop Each Eye Every 6 Hours As Needed 17)  Tussionex Pennkinetic Er 8-10 Mg/60ml Lqcr (  Chlorpheniramine-Hydrocodone) .[name].. 1 Tsp Every 12 Hr As Needed For Cough, May Make You Sleepy 18)  Simvastatin 40 Mg Tabs (Simvastatin) .... Take One Tablet At Bedtime Start One Week After Completing Diflucan 19)  Magnesium Oxide 250 Mg Tabs (Magnesium Oxide) .... One Tablet By Mouth Once Daily 20)  Levbid 0.375 Mg Xr12h-Tab (Hyoscyamine Sulfate) .[name].. 1 By Mouth Two Times A Day As Needed  Allergies (verified): 1)  ! Methotrexate  Past History:  Past Medical History: Last updated: 10/13/2009 EDEMA (ICD-782.3) HEADACHE (ICD-784.0) MENOPAUSAL SYNDROME (ICD-627.2) UTI (ICD-599.0) SARCOIDOSIS  (ICD-135)   -CT Chest 6/08 improvement in Mediastinal LAN   -11/08 start of remecade Rx per [facility] PULMONARY NODULE (ICD-518.89)    -CT Chest 6/08 reduction in size of nodules due to Arava and Pred RX     -probable granulomas due to Sarcoidosis HYPERTENSION (ICD-401.9) HYPERLIPIDEMIA (ICD-272.4) GERD (ICD-530.81) Sleep APnea DIABETES MELLITUS, TYPE II (ICD-250.00) ANEMIA-NOS (ICD-285.9)  Fundic Gland Polyp  Past Surgical History: Last updated: 08/21/2006 Ventral Hernia Repair Nissen Fundoplication Cholecystectomy Hysterectomy  Family History: Last updated: 10/20/2009 father-alive MI, DM mother alive and well uncle with SARCOID No FH of Colon Cancer: Family History Emphysema-Mother, Father  Family History Asthma-Asthma Family History COPD -mother  Social History: Last updated: 10/20/2009 Married, live with husband Never Smoked Regular exercise-no Alcohol use-no Illicit Drug Use - no  Family History: father-alive MI, DM mother alive and well uncle with SARCOID No FH of Colon Cancer: Family History Emphysema-Mother, Father  Family History Asthma-Asthma Family History COPD -mother  Social History: Married, live with husband Never Smoked Regular exercise-no Alcohol use-no Illicit Drug Use - no  Review of Systems       The patient complains of shortness of breath with activity, non-productive cough, acid heartburn, indigestion, anxiety, and depression.  The patient denies shortness of breath at rest, productive cough, coughing up blood, chest pain, irregular heartbeats, loss of appetite, weight change, abdominal pain, difficulty swallowing, sore throat, tooth/dental problems, headaches, nasal congestion/difficulty breathing through nose, sneezing, itching, ear ache, hand/feet swelling, joint stiffness or pain, rash, change in color of mucus, and fever.    Vital Signs:  Patient profile:   50 year old female Height:      66 inches Weight:       276.50 pounds O2 Sat:      97 % on Room air Temp:     97.7 degrees F oral Pulse rate:   76 / minute BP sitting:   120 / 72  (left arm) Cuff size:   large  Vitals Entered By: Zackery Barefoot CMA (October 20, 2009 1:32 PM)  O2 Flow:  Room air CC: Pt  here for sleep consult Comments Medications reviewed with patient Zackery Barefoot CMA  October 20, 2009 1:35 PM    Physical Exam  Additional Exam:   GEN: A/Ox3; pleasant , NAD, obese female.  HEENT:  [place]/AT, , EACs-clear, TMs-wnl, NOSE-purulence in nares,  nasal edema THROAT-clear NECK:  Supple w/ fair ROM; no JVD; normal carotid impulses w/o bruits; no thyromegaly or nodules palpated; no lymphadenopathy. RESP  Coarse BS w/ no wheeizng, has some mild upper airway psuedowheezing on forced exp.  CARD:  RRR, no m/r/g   GI:   Soft & nt; nml bowel sounds; no organomegaly or masses detected. Musco: Warm bil,  no calf tenderness edema, clubbing, pulses intact Neuro: intact w/ no focal deficits.    Impression & Recommendations:  Problem # 1:  SLEEP APNEA (ICD-780.57) The pathophysiology of  obstructive sleep apnea, it's cardiovascular consequences and modes of treatment including CPAP were discussed with the patient in great detail.  Will start autoCPAP 5-15 cm with small full face mask. Compliance encouraged, wt loss emphasized, asked to avoid meds with sedative side effects, cautioned against driving when sleepy.   Orders: Est. Patient Level IV (09811) DME Referral (DME)  Patient Instructions: 1)  Please schedule a follow-up appointment in 1 month. 2)  We will check downlaod & make adjustments as needed then  Appended Document: Orders Update reviewed download 1/27 - 2/9 >> avg pr 7 cm, no residual events, needs to improve usage    Clinical Lists Changes  Orders: Added new Referral order of DME Referral (DME) - Signed

## 2010-11-12 NOTE — Assessment & Plan Note (Signed)
Summary: ear issues//ccm   Vital Signs:  Patient profile:   50 year old female Pulse rate:   84 / minute Resp:     14 per minute BP sitting:   142 / 76  (left arm) Cuff size:   large  Vitals Entered By: Gladis Riffle, RN (November 05, 2009 11:38 AM)   Primary Care Provider:  Birdie Sons, MD    History of Present Illness: sinus congestion with pressure as well as ear congestion no fever or chills hearing is normal duration 5 days no ill contacts  All other systems reviewed and were negative   Preventive Screening-Counseling & Management  Alcohol-Tobacco     Smoking Status: never  Current Problems (verified): 1)  Sinusitis, Chronic  (ICD-473.9) 2)  Anemia-unspecified  (ICD-285.9) 3)  Back Pain  (ICD-724.5) 4)  Obesity  (ICD-278.00) 5)  Sleep Apnea  (ICD-780.57) 6)  Menopausal Syndrome  (ICD-627.2) 7)  Sarcoidosis  (ICD-135) 8)  Hypertension  (ICD-401.9) 9)  Hyperlipidemia  (ICD-272.4) 10)  Gerd  (ICD-530.81) 11)  Diabetes Mellitus, Type II  (ICD-250.00)  Allergies: 1)  ! Methotrexate  Comments:  Nurse/Medical Assistant: c/o  bilateral ear congestion with swollen lymph gland left side of neck  The patient's medications and allergies were reviewed with the patient and were updated in the Medication and Allergy Lists. Gladis Riffle, RN (November 05, 2009 11:39 AM)  Past History:  Past Medical History: Last updated: 10/13/2009 EDEMA (ICD-782.3) HEADACHE (ICD-784.0) MENOPAUSAL SYNDROME (ICD-627.2) UTI (ICD-599.0) SARCOIDOSIS (ICD-135)   -CT Chest 6/08 improvement in Mediastinal LAN   -11/08 start of remecade Rx per [facility] PULMONARY NODULE (ICD-518.89)    -CT Chest 6/08 reduction in size of nodules due to Arava and Pred RX     -probable granulomas due to Sarcoidosis HYPERTENSION (ICD-401.9) HYPERLIPIDEMIA (ICD-272.4) GERD (ICD-530.81) Sleep APnea DIABETES MELLITUS, TYPE II (ICD-250.00) ANEMIA-NOS (ICD-285.9)  Fundic Gland Polyp  Past  Surgical History: Last updated: 08/21/2006 Ventral Hernia Repair Nissen Fundoplication Cholecystectomy Hysterectomy  Family History: Last updated: 10/20/2009 father-alive MI, DM mother alive and well uncle with SARCOID No FH of Colon Cancer: Family History Emphysema-Mother, Father  Family History Asthma-Asthma Family History COPD -mother  Social History: Last updated: 10/20/2009 Married, live with husband Never Smoked Regular exercise-no Alcohol use-no Illicit Drug Use - no  Risk Factors: Exercise: no (05/31/2007)  Risk Factors: Smoking Status: never (11/05/2009)  Physical Exam  General:  Well-developed,well-nourished,in no acute distress; alert,appropriate and cooperative throughout examination Head:  normocephalic and atraumatic.   Ears:  R ear normal, L ear normal, and no external deformities.   Nose:  no external deformity, no external erythema, and nasal dischargemucosal pallor.   Neck:  no masses, thyromegaly, or abnormal cervical nodes Chest Wall:  No deformities, masses, or tenderness noted. Heart:  normal rate and regular rhythm.     Impression & Recommendations:  Problem # 1:  SINUSITIS, CHRONIC (ICD-473.9) i don't think infectious she will call if sxs persist or worsen may need imaging---will avoid for now Her updated medication list for this problem includes:    Fluticasone Propionate 50 Mcg/act Susp (Fluticasone propionate) .[name]... 2 sprays each nostril once daily    Tussionex Pennkinetic Er 8-10 Mg/73ml Lqcr (Chlorpheniramine-hydrocodone) .[name]... 1 tsp every 12 hr as needed for cough, may make you sleepy  Complete Medication List: 1)  Atenolol-chlorthalidone 50-25 Mg Tabs (Atenolol-chlorthalidone) .... Take 1 tablet by mouth once a day 2)  Effexor Xr 75 Mg Cp24 (Venlafaxine hcl) .... Take 1 tablet by  mouth once a day 3)  Lorazepam 1 Mg Tabs (Lorazepam) .... One every 6 hrs as needed 4)  Novolog Flexpen 100 Unit/ml Soln (Insulin aspart) .... Three times  a day as directed 5)  Onetouch Ultra Test Strp (Glucose blood) .... Tid 6)  Potassium Chloride Crys Cr 20 Meq Tbcr (Potassium chloride crys cr) .[name].. 1 three times a day 7)  Prednisone 10 Mg Tabs (Prednisone) .... Take as directed. 8)  Zegerid 40-1100 Mg Caps (Omeprazole-sodium bicarbonate) .... Take 1 tablet by mouth once a day 9)  Pen Needles 31g X 8 Mm Misc (Insulin pen needle) .... Use as directed 10)  Remicade 100 Mg Solr (Infliximab) .... Injection every 6 weeks 11)  Fluticasone Propionate 50 Mcg/act Susp (Fluticasone propionate) .... 2 sprays each nostril once daily 12)  Furosemide 20 Mg Tabs (Furosemide) .... Take one (1) by mouth daily 13)  Vicodin 5-500 Mg Tabs (Hydrocodone-acetaminophen) .... One tab every 6 hours as needed 14)  Estrace 1 Mg Tabs (Estradiol) .... Take 1 tablet by mouth once a day 15)  Restasis 0.05 % Emul (Cyclosporine) .[name].. 1 drop each eye daily 16)  Lotemax 0.5 % Susp (Loteprednol etabonate) .[name].. 1 drop each eye every 6 hours as needed 17)  Tussionex Pennkinetic Er 8-10 Mg/45ml Lqcr (Chlorpheniramine-hydrocodone) .[name].. 1 tsp every 12 hr as needed for cough, may make you sleepy 18)  Simvastatin 40 Mg Tabs (Simvastatin) .... Take one tablet at bedtime start one week after completing diflucan 19)  Magnesium Oxide 250 Mg Tabs (Magnesium oxide) .... One tablet by mouth once daily 20)  Levbid 0.375 Mg Xr12h-tab (Hyoscyamine sulfate) .[name].. 1 by mouth two times a day as needed 21)  Prednisone 1 Mg Tabs (Prednisone) .... Use for prednisone taper as directed

## 2010-11-12 NOTE — Assessment & Plan Note (Signed)
Summary: rov 5 wks/ cpap///kp   Visit Type:  Follow-up Copy to:  Dr. Delford Field Primary Provider/Referring Provider:  Birdie Sons, MD   CC:  Pt here for follow up. Pt states is using CPAP machine every night approx 7 to 8 hours. Pt states feels tired some mornings. Pt c/o of area under nose being sore some mornings when waking up and headaches..  History of Present Illness: 50 year old, white female with chronic pulmonary sarcoidosis.  This is been manifested by cyclic cough and mediastinal adenopathy with associated chest pain.  There are also stable pulmonary nodules.  She has been intolerant of systemic corticosteroids &  had adverse side effects to Arava(leflunamide). She was referred the patient in November 2008  to Dr. Azzie Roup,  for Remicade injections.  There has been a positive response to Rx. She had severe GERD s/p fundoplication in 2001.  October 20, 2009 5:17 PM  Sleep consult Epworth Sleepiness Score 9/24.  Gained 10 lbs in the last 2 years. Reviewed overnight PSG >> moderate obstructive sleep apnea with predominant hypopneas & RERAs, RDI 25/h,AHI 15/h with nadir desatn to 89%, no arrythmias, PLms.  December 09, 2009 3:59 PM  reviewed & discussed download 1/27 - 2/9 >> avg pr 7 cm, no residual events, needs to improve usage wt 268, lost 20 lbs on wt watchers. Mask Ok, pressure Ok  Current Medications (verified): 1)  Atenolol-Chlorthalidone 50-25 Mg Tabs (Atenolol-Chlorthalidone) .... Take 1 Tablet By Mouth Once A Day 2)  Effexor Xr 75 Mg Cp24 (Venlafaxine Hcl) .... Take 1 Tablet By Mouth Once A Day 3)  Lorazepam 1 Mg Tabs (Lorazepam) .... One Every 6 Hrs As Needed 4)  Novolog Flexpen 100 Unit/ml Soln (Insulin Aspart) .... Three Times A Day As Directed 5)  Onetouch Ultra Test  Strp (Glucose Blood) .... Tid 6)  Potassium Chloride Crys Cr 20 Meq Tbcr (Potassium Chloride Crys Cr) .[name].. 1 Three Times A Day 7)  Zegerid 40-1100 Mg Caps (Omeprazole-Sodium Bicarbonate) .... Take 1  Tablet By Mouth Once A Day 8)  Pen Needles 31g X 8 Mm  Misc (Insulin Pen Needle) .... Use As Directed 9)  Remicade 100 Mg  Solr (Infliximab) .... Injection Every 6 Weeks 10)  Fluticasone Propionate 50 Mcg/act  Susp (Fluticasone Propionate) .... 2 Sprays Each Nostril Once Daily 11)  Furosemide 20 Mg Tabs (Furosemide) .... Take One (1) By Mouth Daily 12)  Vicodin 5-500 Mg  Tabs (Hydrocodone-Acetaminophen) .... One Tab Every 6 Hours As Needed 13)  Estrace 1 Mg Tabs (Estradiol) .... Take 1 Tablet By Mouth Once A Day 14)  Restasis 0.05 % Emul (Cyclosporine) .[name].. 1 Drop Each Eye Daily 15)  Lotemax 0.5 % Susp (Loteprednol Etabonate) .[name].. 1 Drop Each Eye Every 6 Hours As Needed 16)  Tussionex Pennkinetic Er 8-10 Mg/13ml Lqcr (Chlorpheniramine-Hydrocodone) .[name].. 1 Tsp Every 12 Hr As Needed For Cough, May Make You Sleepy 17)  Simvastatin 40 Mg Tabs (Simvastatin) .... Take One Tablet At Bedtime Start One Week After Completing Diflucan 18)  Magnesium Oxide 250 Mg Tabs (Magnesium Oxide) .... One Tablet By Mouth Once Daily 19)  Levbid 0.375 Mg Xr12h-Tab (Hyoscyamine Sulfate) .[name].. 1 By Mouth Two Times A Day As Needed 20)  Prednisone 5 Mg Tabs (Prednisone) .... Take 1 Tablet By Mouth Once A Day  Allergies (verified): 1)  ! Methotrexate  Past History:  Past Medical History: Last updated: 10/13/2009 EDEMA (ICD-782.3) HEADACHE (ICD-784.0) MENOPAUSAL SYNDROME (ICD-627.2) UTI (ICD-599.0) SARCOIDOSIS (ICD-135)   -CT Chest 6/08  improvement in Mediastinal LAN   -11/08 start of remecade Rx per [facility] PULMONARY NODULE (ICD-518.89)    -CT Chest 6/08 reduction in size of nodules due to Arava and Pred RX     -probable granulomas due to Sarcoidosis HYPERTENSION (ICD-401.9) HYPERLIPIDEMIA (ICD-272.4) GERD (ICD-530.81) Sleep APnea DIABETES MELLITUS, TYPE II (ICD-250.00) ANEMIA-NOS (ICD-285.9)  Fundic Gland Polyp  Social History: Last updated: 10/20/2009 Married, live with husband Never  Smoked Regular exercise-no Alcohol use-no Illicit Drug Use - no  Risk Factors: Smoking Status: never (11/07/2009)  Review of Systems  The patient denies anorexia, fever, weight loss, weight gain, vision loss, decreased hearing, hoarseness, chest pain, syncope, dyspnea on exertion, peripheral edema, prolonged cough, headaches, hemoptysis, abdominal pain, melena, hematochezia, severe indigestion/heartburn, hematuria, muscle weakness, suspicious skin lesions, difficulty walking, depression, unusual weight change, and abnormal bleeding.    Vital Signs:  Patient profile:   50 year old female Height:      66 inches Weight:      268 pounds O2 Sat:      96 % on Room air Temp:     98.1 degrees F oral Pulse rate:   85 / minute BP sitting:   124 / 76  (left arm) Cuff size:   large  Vitals Entered By: Zackery Barefoot CMA (December 09, 2009 3:48 PM)  O2 Flow:  Room air CC: Pt here for follow up. Pt states is using CPAP machine every night approx 7 to 8 hours. Pt states feels tired some mornings. Pt c/o of area under nose being sore some mornings when waking up and headaches. Comments Medications reviewed with patient Verified contact number and pharmacy with patient Zackery Barefoot CMA  December 09, 2009 3:49 PM    Physical Exam  Additional Exam:   GEN: A/Ox3; pleasant , NAD, obese female.  HEENT:  [place]/AT, , EACs-clear, TMs-wnl, NOSE-mild erythema THROAT-clear NECK:  Supple w/ fair ROM; no JVD; normal carotid impulses w/o bruits; no thyromegaly or nodules palpated; no lymphadenopathy. RESP  Coarse BS w/ no wheeizng, has some mild upper airway psuedowheezing on forced exp.  CARD:  RRR, no m/r/g   Musco: Warm bil,  no calf tenderness edema, clubbing, pulses intact    Impression & Recommendations:  Problem # 1:  SARCOIDOSIS (ICD-135) Assessment Comment Only remicaide injections plan seems to be to taper steroids to off - hopefullt this will help with wt loss  Problem # 2:  SLEEP APNEA  (ICD-780.57) Compliance encouraged, wt loss emphasized, asked to avoid meds with sedative side effects, cautioned against driving when sleepy.  ct cpap 7 cm Orders: Est. Patient Level III (16109) Est. Patient Level III (60454) DME Referral (DME)  Medications Added to Medication List This Visit: 1)  Cpap 7 Cm   Patient Instructions: 1)  Copy sent to: Dr Delford Field 2)  Please schedule a follow-up appointment in 1 year.  Appended Document: rov 5 wks/ cpap///kp reviewed 2/21- 3/7 >> compliance 10/15 ds, avg use 2 h, pr 7 cm, no residual events  pl ask her to try & increase usage of CPPA to 4 h/ night at least  Appended Document: rov 5 wks/ cpap///kp Left message with family member to call back.  Appended Document: rov 5 wks/ cpap///kp Left message with family member x 2.   Appended Document: rov 5 wks/ cpap///kp Letter mailed.  Appended Document: rov 5 wks/ cpap///kp  spoke with pt.  Pt informed of above statement per RA. she verbalized understanding and had  no questions.

## 2010-11-12 NOTE — Assessment & Plan Note (Signed)
Summary: knot on side of right foot/painful/red/cjr   Vital Signs:  Patient profile:   50 year old female Weight:      275 pounds BMI:     44.55 Temp:     97.8 degrees F oral BP sitting:   150 / 88  (left arm) Cuff size:   regular  Vitals Entered By: [name], RN (September 22, 2009 9:33 AM) CC: C/o R foot red, painful and swollen. Taking Indocin from Urgent Care and not better. Is Patient Diabetic? Yes   Primary Care Provider:  Birdie Sons, MD   CC:  C/o R foot red and painful and swollen. Taking Indocin from Urgent Care and not better.[name]  History of Present Illness: 50 year old patient who has a history of pulmonary sarcoid treated with prednisone  10 mg daily, as well as Remicade.  Three days ago, she had the onset of age, hematocrit, right foot pain involving the lateral aspect of the foot.  There worsening pain and swelling, she was seen in urgent care where an x-ray was negative.  For the past 3 days, she has been on indomethacin without benefit.  she does have a history of gout, and use of his response to anti-inflammatories within the first 24 hours.  She does not feel this is typical for her gouty arthriti. For the past two days, there is an increase in pain, swelling, and redness.  There's been no fever or chills.  She has been on Flagyl recently by GI.  Due to suspected diverticulitis.  She states that she is seen in the ER about 6 weeks ago and her white count was elevated out of proportion to her chronic prednisone use. She has type 2 diabetes which has been stable.  She denies any fever or chills  Allergies: 1)  ! Methotrexate  Past History:  Past Medical History: Reviewed history from 04/21/2009 and no changes required. EDEMA (ICD-782.3) HEADACHE (ICD-784.0) MENOPAUSAL SYNDROME (ICD-627.2) UTI (ICD-599.0) SARCOIDOSIS (ICD-135)   -CT Chest 6/08 improvement in Mediastinal LAN   -11/08 start of remecade Rx per [facility] PULMONARY NODULE  (ICD-518.89)    -CT Chest 6/08 reduction in size of nodules due to Arava and Pred RX     -probable granulomas due to Sarcoidosis HYPERTENSION (ICD-401.9) HYPERLIPIDEMIA (ICD-272.4) GERD (ICD-530.81) Sleep APnea DIABETES MELLITUS, TYPE II (ICD-250.00) ANEMIA-NOS (ICD-285.9)    Review of Systems       The patient complains of suspicious skin lesions and difficulty walking.  The patient denies anorexia, fever, weight loss, weight gain, vision loss, decreased hearing, hoarseness, chest pain, syncope, dyspnea on exertion, peripheral edema, prolonged cough, headaches, hemoptysis, abdominal pain, melena, hematochezia, severe indigestion/heartburn, hematuria, incontinence, genital sores, muscle weakness, transient blindness, depression, unusual weight change, abnormal bleeding, enlarged lymph nodes, angioedema, and breast masses.    Physical Exam  General:  overweight-appearing.  no distress Msk:  patient has some swelling involving her right lateral foot region and the dorsum of the foot; area of patchy erythema without fluctuance involving the lateral midfoot region.  The area is warm to touch and tender.  The ankle was not involved.  No tenderness about the MTP joints   Impression & Recommendations:  Problem # 1:  CELLULITIS, FOOT, RIGHT (ICD-682.7)  Her updated medication list for this problem includes:    Flagyl 500 Mg Tabs (Metronidazole) .[name]... Take 1 tab 3 times daily x 10 days    Amoxicillin-pot Clavulanate 875-125 Mg Tabs (Amoxicillin-pot clavulanate) ..... One twice daily  Orders: Prescription Created Electronically [phone])  Problem # 2:  DIABETES MELLITUS, TYPE II (ICD-250.00)  Her updated medication list for this problem includes:    Novolog Flexpen 100 Unit/ml Soln (Insulin aspart) .[name]... Three times a day as directed  Complete Medication List: 1)  Atenolol-chlorthalidone 50-25 Mg Tabs (Atenolol-chlorthalidone) .... Take 1 tablet by mouth once a day 2)  Effexor Xr 75 Mg  Cp24 (Venlafaxine hcl) .... Take 1 tablet by mouth once a day 3)  Lorazepam 1 Mg Tabs (Lorazepam) .... One every 6 hrs as needed 4)  Novolog Flexpen 100 Unit/ml Soln (Insulin aspart) .... Three times a day as directed 5)  Onetouch Ultra Test Strp (Glucose blood) .... Tid 6)  Potassium Chloride Crys Cr 20 Meq Tbcr (Potassium chloride crys cr) .[name].. 1 three times a day 7)  Prednisone 10 Mg Tabs (Prednisone) .... Take as directed. 8)  Zegerid 40-1100 Mg Caps (Omeprazole-sodium bicarbonate) .... Take 1 tablet by mouth once a day 9)  Pen Needles 31g X 8 Mm Misc (Insulin pen needle) .... Use as directed 10)  Remicade 100 Mg Solr (Infliximab) .... Injection every 6 weeks 11)  Fluticasone Propionate 50 Mcg/act Susp (Fluticasone propionate) .... 2 sprays each nostril once daily 12)  Furosemide 20 Mg Tabs (Furosemide) .... Take one (1) by mouth daily 13)  Vicodin 5-500 Mg Tabs (Hydrocodone-acetaminophen) .... One tab every 6 hours as needed 14)  Estrace 1 Mg Tabs (Estradiol) .... Take 1 tablet by mouth once a day 15)  Restasis 0.05 % Emul (Cyclosporine) .[name].. 1 drop each eye daily 16)  Lotemax 0.5 % Susp (Loteprednol etabonate) .[name].. 1 drop each eye every 6 hours as needed 17)  Utac 500-500 Mg Tabs (Methenamine mand-sod phosphate) .... Two times a day 18)  Tussionex Pennkinetic Er 8-10 Mg/4ml Lqcr (Chlorpheniramine-hydrocodone) .[name].. 1 tsp every 12 hr as needed for cough, may make you sleepy 19)  Simvastatin 40 Mg Tabs (Simvastatin) .... Take one tablet at bedtime start one week after completing diflucan 20)  Magnesium Oxide 250 Mg Tabs (Magnesium oxide) .... One tablet by mouth once daily 21)  Flagyl 500 Mg Tabs (Metronidazole) .... Take 1 tab 3 times daily x 10 days 22)  Bentyl 10 Mg Caps (Dicyclomine hcl) .... Take 1 tab twice daily 23)  Amoxicillin-pot Clavulanate 875-125 Mg Tabs (Amoxicillin-pot clavulanate) .... One twice daily  Patient Instructions: 1)  call if  there is worsening pain, swelling, or  redness or if you develop fever or chills 2)  Take your antibiotic as prescribed until ALL of it is gone, but stop if you develop a rash or swelling and contact our office as soon as possible. Prescriptions: AMOXICILLIN-POT CLAVULANATE 875-125 MG TABS (AMOXICILLIN-POT CLAVULANATE) one twice daily  #20 x 0   Entered and Authorized by:   Gordy Savers  MD   Signed by:   Gordy Savers  MD on 09/22/2009   Method used:   Print then Give to Patient   RxID:   [id number] AMOXICILLIN-POT CLAVULANATE 875-125 MG TABS (AMOXICILLIN-POT CLAVULANATE) one twice daily  #20 x 0   Entered and Authorized by:   Gordy Savers  MD   Signed by:   Gordy Savers  MD on 09/22/2009   Method used:   Electronically to        [facility] (retail)       [address]       [place], [place]  [address]       Ph: [phone]       Fax: [phone]   RxID:   [id number]

## 2010-11-12 NOTE — Assessment & Plan Note (Signed)
Summary: bruises on arms/njr 1.30p  Medications Added ONETOUCH ULTRA TEST  STRP (GLUCOSE BLOOD) tid VERAMYST 27.5 MCG/SPRAY  SUSP (FLUTICASONE FUROATE) as needed AZITHROMYCIN 250 MG  TABS (AZITHROMYCIN) 2 by  mouth today and then 1 daily for 4 days        Vital Signs:  Patient Profile:   50 Years Old Female Height:     66 inches Weight:      283 pounds Temp:     98.4 degrees F Pulse rate:   88 / minute BP sitting:   150 / 90  (left arm) Cuff size:   large  Vitals Entered By: Gladis Riffle, RN (December 21, 2007 1:48 PM)                 Chief Complaint:  c/o bruising on arms after last iv Rx and was told Rx does not cause this and to see PCP--also cold with headache, ear pain, and and chest pain x 2 days.  History of Present Illness: one bruise on each arm---medial aspect of arm-close to medial epicondle--no known trauma. no weakness  also has mild cough--no fever or chills  Acute Visit History:      The patient complains of cough.  She denies fever.  Other comments include: HX OF SARCOID---ALSO PREVIOUS DX OF VOCAL CORD DYSFUNCTION.        The character of the cough is described as nonproductive.  There is no history of wheezing, sleep interference, shortness of breath, respiratory retractions, tachypnea, cyanosis, or interference with oral intake associated with her cough.           Current Allergies (reviewed today): ! METHOTREXATE  Past Medical History:    Reviewed history from 09/15/2007 and no changes required:       Anemia-NOS       Diabetes mellitus, type II       GERD       Hyperlipidemia       Hypertension       pulmonary nodule       OSA       Vocal Cord dysfunction with recurrent cough       Asthma vs Vocal cord dysfundtion       SARCOID  Past Surgical History:    Reviewed history from 08/21/2006 and no changes required:       Ventral Hernia Repair       Nissen Fundoplication       Cholecystectomy       Hysterectomy   Family History:    Reviewed  history from 09/15/2007 and no changes required:       father-alive MI, DM       mother alive and well       uncle with SARCOID  Social History:    Reviewed history from 05/31/2007 and no changes required:       Married       Never Smoked       Regular exercise-no       Alcohol use-no    Review of Systems       no other complaints in a complete ROS    Physical Exam  General:     Well-developed,well-nourished,in no acute distress; alert,appropriate and cooperative throughout examination Head:     Normocephalic and atraumatic without obvious abnormalities. No apparent alopecia or balding. Neck:     No deformities, masses, or tenderness noted. Lungs:     few rhonchi bilaterally Skin:     bruising medial forearm  bilaterally minimal tenderness    Impression & Recommendations:  Problem # 1:  BRONCHITIS-ACUTE (ICD-466.0) Assessment: Comment Only  Her updated medication list for this problem includes:    Albuterol 90 Mcg/act Aers (Albuterol) ..... Inhale 2 puff as directed three times a day    Qvar 80 Mcg/act Aers (Beclomethasone dipropionate) .[name]..[name] Two puffs twice daily    Azithromycin 250 Mg Tabs (Azithromycin) .[name]... 2 by  mouth today and then 1 daily for 4 days  bruising--if worsens will need further evaluation  Complete Medication List: 1)  Albuterol 90 Mcg/act Aers (Albuterol) .... Inhale 2 puff as directed three times a day 2)  Atenolol-chlorthalidone 50-25 Mg Tabs (Atenolol-chlorthalidone) .... 1/1/2 daily 3)  Budeprion Xl 300 Mg Tb24 (Bupropion hcl) .... Take 1 tablet by mouth once a day 4)  Effexor Xr 75 Mg Cp24 (Venlafaxine hcl) .... Take 1 capsule by mouth once a day 5)  Lorazepam 1 Mg Tabs (Lorazepam) .... Prn 6)  Novolog Flexpen 100 Unit/ml Soln (Insulin aspart) .... Three times a day as directed 7)  Onetouch Ultra Test Strp (Glucose blood) .... Tid 8)  Potassium Chloride Crys Cr 20 Meq Tbcr (Potassium chloride crys cr) .... Take 1 tablet by mouth once a  day 9)  Prednisone 10 Mg Tabs (Prednisone) .[name].. 1 1/2 once daily 10)  Reglan 10 Mg Tabs (Metoclopramide hcl) .... Take 1 tablet by mouth twice a day 11)  Zegerid 40-1100 Mg Caps (Omeprazole-sodium bicarbonate) .... 2 by mouth daily 12)  Crestor 20 Mg Tabs (Rosuvastatin calcium) .[name].. 1 by mouth once daily 13)  Qvar 80 Mcg/act Aers (Beclomethasone dipropionate) .... Two puffs twice daily 14)  Pen Needles 31g X 8 Mm Misc (Insulin pen needle) .... Use as directed need f/u appt b-4 addtional refills 15)  Premarin 0.3 Mg Tabs (Estrogens conjugated) .... Once daily 16)  Remicade 100 Mg Solr (Infliximab) .... Injection every 8weeks 17)  Veramyst 27.5 Mcg/spray Susp (Fluticasone furoate) .... As needed 18)  Azithromycin 250 Mg Tabs (Azithromycin) .... 2 by  mouth today and then 1 daily for 4 days     Prescriptions: VERAMYST 27.5 MCG/SPRAY  SUSP (FLUTICASONE FUROATE) as needed  #1 x 3   Entered and Authorized by:   Birdie Sons MD   Signed by:   Birdie Sons MD on 12/21/2007   Method used:   Electronically sent to ...       [facility]       [address]       [place], [place]  [address]       Ph: [phone]       Fax: [phone]   RxID:   [id number] AZITHROMYCIN 250 MG  TABS (AZITHROMYCIN) 2 by  mouth today and then 1 daily for 4 days  #6 x 0   Entered and Authorized by:   Birdie Sons MD   Signed by:   Birdie Sons MD on 12/21/2007   Method used:   Electronically sent to ...       [facility]       [address]       [place], [place]  [address]       Ph: [phone]       Fax: [phone]   RxID:   [id number]  ]

## 2010-11-12 NOTE — Progress Notes (Signed)
Summary: prep concern   Phone Note Call from Patient Call back at Home Phone [phone]   Caller: husband Call For: Dr. Marina Goodell Reason for Call: Talk to Nurse Summary of Call: prep concern Initial call taken by: Vallarie Mare,  April 28, 2010 3:02 PM  Follow-up for Phone Call        Nauseated, has Reglan on-hand, plans to take 30 min prior to starting prep, will call again if no improvement/worsening of symptoms.  Flagged MD for confirmation. Follow-up by: Doristine Church RN II,  April 28, 2010 3:37 PM  Additional Follow-up for Phone Call Additional follow up Details #1::        Advised pt to suck on hard candy while doing prep and agreed with use of Reglan (per MD). Additional Follow-up by: Doristine Church RN II,  April 28, 2010 3:43 PM

## 2010-11-12 NOTE — Assessment & Plan Note (Signed)
Summary: hosp fup/esophogus pain/low potassium/cjr   Vital Signs:  Patient profile:   50 year old female Menstrual status:  hysterectomy Weight:      241 pounds Temp:     97.6 degrees F oral Pulse rate:   80 / minute BP sitting:   134 / 82  (left arm) Cuff size:   large  Vitals Entered By: Alfred Levins, CMA (August 05, 2010 9:12 AM) CC: hosp f/u   Primary Care Provider:  Birdie Sons, MD   CC:  hosp f/u.  History of Present Illness: f/u after ED visit reviewed Dr Harmon Pier and Dr. Lynelle Doctor note she continues to have a chronic, stabbing chest pain. thought to be GI source---has f/u with dr Marina Goodell next week.   DM---cbgs have been < 150 (note 208 yesterday)  htn---tolerating meds. note hypokalemia (on chlorthalidone)  All other systems reviewed and were negative   Current Problems (verified): 1)  Hypokalemia  (ICD-276.8) 2)  Glucocorticoid Deficiency  (ICD-255.41) 3)  Cough  (ICD-786.2) 4)  Anemia-unspecified  (ICD-285.9) 5)  Obesity  (ICD-278.00) 6)  Sleep Apnea  (ICD-780.57) 7)  Menopausal Syndrome  (ICD-627.2) 8)  Sarcoidosis  (ICD-135) 9)  Hypertension  (ICD-401.9) 10)  Hyperlipidemia  (ICD-272.4) 11)  Gerd  (ICD-530.81) 12)  Diabetes Mellitus, Type II  (ICD-250.00)  Current Medications (verified): 1)  Atenolol-Chlorthalidone 50-25 Mg Tabs (Atenolol-Chlorthalidone) .... Take 1 Tablet By Mouth Once A Day 2)  Effexor Xr 75 Mg Cp24 (Venlafaxine Hcl) .... Take 1 Tablet By Mouth Once A Day 3)  Lorazepam 1 Mg Tabs (Lorazepam) .... One Every 6 Hrs As Needed 4)  Remicade 100 Mg  Solr (Infliximab) .... Injection Every 6 Weeks 5)  Omnaris 50 Mcg/act Susp (Ciclesonide) .... One Spray Each Nostril Once Daily 6)  Estrace 1 Mg Tabs (Estradiol) .... Take 1 Tablet By Mouth Once A Day 7)  Restasis 0.05 % Emul (Cyclosporine) .[name].. 1 Drop Each Eye Daily 8)  Lotemax 0.5 % Susp (Loteprednol Etabonate) .[name].. 1 Drop Each Eye Every 6 Hours As Needed 9)  Simvastatin 40 Mg Tabs (Simvastatin)  .... Take One Tablet At Bedtime Start One Week After Completing Diflucan 10)  Levbid 0.375 Mg Xr12h-Tab (Hyoscyamine Sulfate) .[name].. 1 By Mouth Two Times A Day As Needed 11)  Prednisone 5 Mg Tabs (Prednisone) .... Take 1 Tablet By Mouth Every Other Day 12)  Cpap 7 Cm 13)  Nystatin-Triamcinolone 100000-0.1 Unit/gm-% Crea (Nystatin-Triamcinolone) .... Apply Two Times A Day To Affected Area As Needed 14)  Furosemide 20 Mg Tabs (Furosemide) .... Once Daily 15)  Klor-Con M20 20 Meq Cr-Tabs (Potassium Chloride Crys Cr) .... Take 3 Tablet By Mouth Once A Day 16)  Budeprion Xl 300 Mg Xr24h-Tab (Bupropion Hcl) .... Take 1 Tablet By Mouth Once A Day 17)  Tussicaps 10-8 Mg Xr12h-Cap (Hydrocod Polst-Chlorphen Polst) .... One By Mouth Two Times A Day As Needed Cough 18)  Benzonatate 100 Mg Caps (Benzonatate) .... Take One Every 4hours As Needed For Cough 19)  Dexilant 60 Mg Cpdr (Dexlansoprazole) .... One By Mouth Daily Failed Omeprazole Severe Reflux Disease  Allergies (verified): 1)  ! Methotrexate  Past History:  Past Medical History: Last updated: 05/21/2010  SARCOIDOSIS (ICD-135)   -CT Chest 6/08 improvement in Mediastinal LAN   -11/08 start of remecade Rx per [facility] PULMONARY NODULE (ICD-518.89)    -CT Chest 6/08 reduction in size of nodules due to Arava and Pred RX     -probable granulomas due to Sarcoidosis HYPERTENSION (ICD-401.9) HYPERLIPIDEMIA (  ICD-272.4) GERD (ICD-530.81) Sleep APnea DIABETES MELLITUS, TYPE II (ICD-250.00) ANEMIA-NOS (ICD-285.9)  Fundic Gland Polyp  Past Surgical History: Last updated: 05/21/2010 Ventral Hernia Repair Nissen Fundoplication Cholecystectomy Hysterectomy right knee arthroscopy May 11, 2010  Family History: Last updated: 10/20/2009 father-alive MI, DM mother alive and well uncle with SARCOID No FH of Colon Cancer: Family History Emphysema-Mother, Father  Family History Asthma-Asthma Family History COPD -mother  Social  History: Last updated: 10/20/2009 Married, live with husband Never Smoked Regular exercise-no Alcohol use-no Illicit Drug Use - no  Risk Factors: Exercise: no (05/31/2007)  Risk Factors: Smoking Status: never (06/17/2010)  Physical Exam  General:  alert and well-developed.   Head:  normocephalic and atraumatic.   Eyes:  pupils equal and pupils round.   Ears:  R ear normal and L ear normal.   Neck:  supple.   Lungs:  normal respiratory effort and no intercostal retractions.   Heart:  normal rate and regular rhythm.   Abdomen:  Bowel sounds positive,abdomen soft and non-tender without masses, organomegaly or hernias noted. Skin:  Intact without suspicious lesions or rashes Psych:  memory intact for recent and remote and normally interactive.     Impression & Recommendations:  Problem # 1:  CHEST PAIN (ICD-786.50) pt wonders whether CP sxs are worse since starting dexilant (instead of zegerid)  Problem # 2:  HYPOKALEMIA (ICD-276.8) resolved (signed labs)  Problem # 3:  GLUCOCORTICOID DEFICIENCY (ICD-255.41) cortisol normal will remove dx from problem list  Problem # 4:  GERD (ICD-530.81) unclear whether timing of dexilant is contributing to sxs will try zegerid bid The following medications were removed from the medication list:    Dexilant 60 Mg Cpdr (Dexlansoprazole) ..... One by mouth daily failed omeprazole severe reflux disease Her updated medication list for this problem includes:    Levbid 0.375 Mg Xr12h-tab (Hyoscyamine sulfate) .[name]... 1 by mouth two times a day as needed    Zegerid 40-1100 Mg Caps (Omeprazole-sodium bicarbonate) .[name]... Take 1 tablet by mouth two times a day  Complete Medication List: 1)  Atenolol-chlorthalidone 50-25 Mg Tabs (Atenolol-chlorthalidone) .... Take 1 tablet by mouth once a day 2)  Effexor Xr 75 Mg Cp24 (Venlafaxine hcl) .... Take 1 tablet by mouth once a day 3)  Lorazepam 1 Mg Tabs (Lorazepam) .... One every 6 hrs as needed 4)   Remicade 100 Mg Solr (Infliximab) .... Injection every 6 weeks 5)  Omnaris 50 Mcg/act Susp (Ciclesonide) .... One spray each nostril once daily 6)  Estrace 1 Mg Tabs (Estradiol) .... Take 1 tablet by mouth once a day 7)  Restasis 0.05 % Emul (Cyclosporine) .[name].. 1 drop each eye daily 8)  Lotemax 0.5 % Susp (Loteprednol etabonate) .[name].. 1 drop each eye every 6 hours as needed 9)  Simvastatin 40 Mg Tabs (Simvastatin) .... Take one tablet at bedtime start one week after completing diflucan 10)  Levbid 0.375 Mg Xr12h-tab (Hyoscyamine sulfate) .[name].. 1 by mouth two times a day as needed 11)  Prednisone 5 Mg Tabs (Prednisone) .... Take 1 tablet by mouth every other day 12)  Cpap 7 Cm  13)  Nystatin-triamcinolone 100000-0.1 Unit/gm-% Crea (Nystatin-triamcinolone) .... Apply two times a day to affected area as needed 14)  Furosemide 20 Mg Tabs (Furosemide) .... Once daily 15)  Klor-con M20 20 Meq Cr-tabs (Potassium chloride crys cr) .... Take 3 tablet by mouth once a day 16)  Budeprion Xl 300 Mg Xr24h-tab (Bupropion hcl) .... Take 1 tablet by mouth once a day 17)  Tussicaps 10-8 Mg Xr12h-cap (Hydrocod polst-chlorphen polst) .... One by mouth two times a day as needed cough 18)  Benzonatate 100 Mg Caps (Benzonatate) .... Take one every 4hours as needed for cough 19)  Zegerid 40-1100 Mg Caps (Omeprazole-sodium bicarbonate) .... Take 1 tablet by mouth two times a day  Other Orders: UA Dipstick w/o Micro (manual) (04540)  Patient Instructions: 1)  2 weeks   Orders Added: 1)  UA Dipstick w/o Micro (manual) [81002] 2)  Est. Patient Level IV [98119]    Laboratory Results   Urine Tests    Routine Urinalysis   Color: yellow Appearance: Clear Glucose: negative   (Normal Range: Negative) Bilirubin: negative   (Normal Range: Negative) Ketone: negative   (Normal Range: Negative) Spec. Gravity: 1.015   (Normal Range: 1.003-1.035) Blood: negative   (Normal Range: Negative) pH: 7.0   (Normal Range:  5.0-8.0) Protein: negative   (Normal Range: Negative) Urobilinogen: 0.2   (Normal Range: 0-1) Nitrite: negative   (Normal Range: Negative) Leukocyte Esterace: negative   (Normal Range: Negative)    Comments: Rita Ohara  August 05, 2010 9:11 AM

## 2010-11-12 NOTE — Assessment & Plan Note (Signed)
History of Present Illness:       The patient comes in today for a Follow-up visit.  The patient denies chest pain, palpitations, dizziness, syncope, low blood sugar symptoms, high blood sugar symptoms, edema, SOB, DOE, PND, and orthopnea.  Since the last visit patient notes no new problems or concerns.  The patient admits to taking medications as prescribed and not monitoring BP.  When questioned about medication side effects, patient notes none.    here for f/u of vocal cord dysfunction, cough, wheeze, htn  Past Medical History:    Reviewed history from 08/21/2006 and no changes required:       Anemia-NOS       Diabetes mellitus, type II       GERD       Hyperlipidemia       Hypertension       pulmonary nodule       OSA       Vocal Cord dysfunction with recurrent cough       Asthma vs Vocal cord dysfundtion  Past Surgical History:    Reviewed history from 08/21/2006 and no changes required:       Ventral Hernia Repair       Nissen Fundoplication       Cholecystectomy       Hysterectomy  Social History:    Married    Never Smoked  Risk Factors:  Tobacco use:  never  Review of Systems  General      Denies fatigue and fever.  CV      Denies chest pain or discomfort and difficulty breathing at night.  Resp      Denies cough and coughing up blood.  GI      Denies change in bowel habits and diarrhea.  GU      Denies dysuria and hematuria.  MS      Denies joint pain and joint swelling.  Derm      Denies dryness.  Neuro      Denies falling down and headaches.  Endo      Denies excessive hunger and excessive thirst.  Physical Exam  General:     Well-developed,well-nourished,in no acute distress; alert,appropriate and cooperative throughout examination See vitals and med sheet Head:     Normocephalic and atraumatic without obvious abnormalities. No apparent alopecia or balding. Mouth:     Oral mucosa and oropharynx without lesions or exudates.   Teeth in good repair. Neck:     No deformities, masses, or tenderness noted. Heart:     Normal rate and regular rhythm. S1 and S2 normal without gallop, murmur, click, rub or other extra sounds. Abdomen:     Bowel sounds positive,abdomen soft and non-tender without masses, organomegaly or hernias noted. Msk:     No deformity or scoliosis noted of thoracic or lumbar spine.   Neurologic:     No cranial nerve deficits noted. Station and gait are normal. Plantar reflexes are down-going bilaterally. DTRs are symmetrical throughout. Sensory, motor and coordinative functions appear intact. Skin:     Intact without suspicious lesions or rashes Cervical Nodes:     No lymphadenopathy noted Axillary Nodes:     No palpable lymphadenopathy Psych:     Cognition and judgment appear intact. Alert and cooperative with normal attention span and concentration. No apparent delusions, illusions, hallucinations   Impression & Recommendations:  Problem # 1:  DISEASE, VOCAL CORD NEC (ICD-478.5) no cough currently she feels well. Continue current meds  Problem # 2:  PULMONARY NODULE (ICD-518.89) f/u with dr Delford Field Note hx of abnormal CT scan. she has had nondiagnostic biopsy  Problem # 3:  HYPERTENSION (ICD-401.9) fair control continue current meds  Problem # 4:  DIABETES MELLITUS, TYPE II (ICD-250.00) Continue current meds. She says her home BPs are in the 100-120 range  Problem # 7:  GERD (ICD-530.81) no current symptoms on PPI

## 2010-11-12 NOTE — Assessment & Plan Note (Signed)
Summary: roa/db   Vital Signs:  Patient Profile:   50 Years Old Female Weight:      272 pounds Temp:     98.2 degrees F oral Pulse rate:   76 / minute Pulse rhythm:   regular Resp:     16 per minute BP sitting:   158 / 80  Vitals Entered By: Lynann Beaver CMA (May 31, 2007 1:21 PM)               Chief Complaint:  rov.  History of Present Illness: here for f/u htn,  dm, lipids she complains of calfs hurting on and off for several months. no known modifying factors..no known associated factors.  Follow-Up Visit      [name] who presents for Follow-up visit.  The patient denies chest pain, palpitations, dizziness, syncope, low blood sugar symptoms, high blood sugar symptoms, edema, SOB, DOE, PND, and orthopnea.  Since the last visit the patient notes no new problems or concerns.  The patient reports taking meds as prescribed, not monitoring BP, and monitoring blood sugars.  When questioned about possible medication side effects, the patient notes none.    Current Allergies: No known allergies   Past Medical History:    Reviewed history from 08/21/2006 and no changes required:       Anemia-NOS       Diabetes mellitus, type II       GERD       Hyperlipidemia       Hypertension       pulmonary nodule       OSA       Vocal Cord dysfunction with recurrent cough       Asthma vs Vocal cord dysfundtion  Past Surgical History:    Reviewed history from 08/21/2006 and no changes required:       Ventral Hernia Repair       Nissen Fundoplication       Cholecystectomy       Hysterectomy   Family History:    father-alive MI, DM    mother alive and well  Social History:    Married    Never Smoked    Regular exercise-no    Alcohol use-no   Risk Factors:  Alcohol use:  no Exercise:  no   Review of Systems  The patient denies anorexia, fever, weight loss, vision loss, decreased hearing, hoarseness, chest pain, syncope, dyspnea on exhertion,  peripheral edema, prolonged cough, hemoptysis, abdominal pain, melena, hematochezia, severe indigestion/heartburn, hematuria, incontinence, genital sores, muscle weakness, suspicious skin lesions, transient blindness, difficulty walking, depression, unusual weight change, abnormal bleeding, enlarged lymph nodes, angioedema, and breast masses.     Physical Exam  General:     Well-developed,well-nourished,in no acute distress; alert,appropriate and cooperative throughout examination Head:     Normocephalic and atraumatic without obvious abnormalities. No apparent alopecia or balding. Nose:     External nasal examination shows no deformity or inflammation. Nasal mucosa are pink and moist without lesions or exudates. Mouth:     Oral mucosa and oropharynx without lesions or exudates.  Teeth in good repair. Neck:     No deformities, masses, or tenderness noted. Chest Wall:     No deformities, masses, or tenderness noted. Lungs:     Normal respiratory effort, chest expands symmetrically. Lungs are clear to auscultation, no crackles or wheezes. Heart:     Normal rate and regular rhythm. S1 and S2 normal without gallop, murmur, click,  rub or other extra sounds. Abdomen:     overwightsoft, non-tender, normal bowel sounds, no distention, no masses, and no guarding.   Msk:     No deformity or scoliosis noted of thoracic or lumbar spine.   Neurologic:     No cranial nerve deficits noted. Station and gait are normal.  Sensory, motor and coordinative functions appear intact.    Impression & Recommendations:  Problem # 1:  SARCOIDOSIS (ICD-135) she is still on prednisone 5 mg by mouth once daily she continues to have some chest discomfort---I'm not sure the two problems are related--she has had a thorough CV evaluation  Problem # 2:  HYPERTENSION (ICD-401.9)  The following medications were removed from the medication list:    Hydrochlorothiazide 25 Mg Tabs (Hydrochlorothiazide)    Norvasc 10  Mg Tabs (Amlodipine besylate) .[name]... Take 1 tablet by mouth once a day  Her updated medication list for this problem includes:    Atenolol-chlorthalidone 50-25 Mg Tabs (Atenolol-chlorthalidone) .[name]... 1/1/2 daily  BP today: 158/80 Prior BP: 118/76 (09/21/2006)  Prior 10 Yr Risk Heart Disease: 8 % (09/21/2006)  Labs Reviewed: Creat: 0.8 (05/24/2007) Chol: 267 (05/24/2007)   HDL: 37.5 (05/24/2007)   LDL: DEL (05/24/2007)   TG: 349 (05/24/2007)   Problem # 3:  DIABETES MELLITUS, TYPE II (ICD-250.00)  Her updated medication list for this problem includes:    Novolog Flexpen 100 Unit/ml Soln (Insulin aspart)  Labs Reviewed: HgBA1c: 6.0 (05/24/2007)   Creat: 0.8 (05/24/2007)      Problem # 4:  HYPERLIPIDEMIA (ICD-272.4) Retrial of statin--start simvastatin The following medications were removed from the medication list:    Vytorin 10-80 Mg Tabs (Ezetimibe-simvastatin) .[name]... Take 1 tablet by mouth once a day  Her updated medication list for this problem includes:    Simvastatin 40 Mg Tabs (Simvastatin) .[name]... Take one tablet at bedtime  Labs Reviewed: Chol: 267 (05/24/2007)   HDL: 37.5 (05/24/2007)   LDL: DEL (05/24/2007)   TG: 349 (05/24/2007) SGOT: 24 (05/24/2007)   SGPT: 23 (05/24/2007)  Prior 10 Yr Risk Heart Disease: 8 % (09/21/2006)   Complete Medication List: 1)  Albuterol 90 Mcg/act Aers (Albuterol) .... Inhale 2 puff as directed three times a day 2)  Atenolol-chlorthalidone 50-25 Mg Tabs (Atenolol-chlorthalidone) .... 1/1/2 daily 3)  Budeprion Xl 300 Mg Tb24 (Bupropion hcl) .... Take 1 tablet by mouth once a day 4)  Effexor Xr 75 Mg Cp24 (Venlafaxine hcl) .... Take 1 capsule by mouth once a day 5)  Lorazepam 1 Mg Tabs (Lorazepam) .... Prn 6)  Novolog Flexpen 100 Unit/ml Soln (Insulin aspart) 7)  Onetouch Ultra Test Strp (Glucose blood) 8)  Potassium Chloride Crys Cr 20 Meq Tbcr (Potassium chloride crys cr) .... Take 1 tablet by mouth once a day 9)  Prednisone 10 Mg Tabs  (Prednisone) .... 1/2 daily 10)  Reglan 10 Mg Tabs (Metoclopramide hcl) .... Take 1 tablet by mouth twice a day 11)  Zegerid 40-1100 Mg Caps (Omeprazole-sodium bicarbonate) .... 2 by mouth daily 12)  Simvastatin 40 Mg Tabs (Simvastatin) .... Take one tablet at bedtime   Patient Instructions: 1)  insulin: take 35/40/35 2)  Please schedule a follow-up appointment in 3 months. 3)  BMP prior to visit, ICD-9: 4)  Hepatic Panel prior to visit, ICD-9: 5)  Lipid Panel prior to visit, ICD-9: 6)  HbgA1C prior to visit, ICD-9: 7)  Urine Microalbumin prior to visit, ICD-9:    Prescriptions: SIMVASTATIN 40 MG TABS (SIMVASTATIN) Take one tablet at bedtime  #  30 x 11   Entered and Authorized by:   Birdie Sons MD   Signed by:   Birdie Sons MD on 05/31/2007   Method used:   Electronically sent to ...       [facility]*       [address]       [place], [place]  [address]       Ph: [phone]       Fax: [phone]   RxID:   [id number]

## 2010-11-12 NOTE — Assessment & Plan Note (Signed)
Summary: 3 MONTH ROA/JLS RSC PER PT HUS/NJR   Vital Signs:  Patient Profile:   50 Years Old Female Height:     66 inches Weight:      290 pounds Temp:     98.3 degrees F Pulse rate:   843 / minute BP sitting:   128 / 78  (left arm) Cuff size:   large  Vitals Entered By: Gladis Riffle, RN (Feb 12, 2008 4:01 PM)                 Referred by:  Chase Picket PCP:  Dr. Birdie Sons  Chief Complaint:  3 month rov--states cough slightly better--c/o bilateral foot swelling for "a while".  History of Present Illness:  Follow-Up Visit      [name] who presents for Follow-up visit.  The patient denies chest pain, palpitations, dizziness, syncope, low blood sugar symptoms, high blood sugar symptoms, edema, SOB, DOE, PND, and orthopnea.  Since the last visit the patient notes no new problems or concerns.  The patient reports taking meds as prescribed and monitoring BP.  When questioned about possible medication side effects, the patient notes none.    Past Medical History: Anemia-NOS Diabetes mellitus, type II GERD Hyperlipidemia Hypertension pulmonary nodule OSA Vocal Cord dysfunction with recurrent cough Asthma vs Vocal cord dysfundtion SARCOID  Current Meds:  PROAIR HFA 108 (90 BASE) MCG/ACT  AERS (ALBUTEROL SULFATE) 2 puffs three times a day as needed ATENOLOL-CHLORTHALIDONE 50-25 MG TABS (ATENOLOL-CHLORTHALIDONE) 1/1/2 daily BUDEPRION XL 300 MG TB24 (BUPROPION HCL) Take 1 tablet by mouth once a day EFFEXOR XR 75 MG CP24 (VENLAFAXINE HCL) Take 1 capsule by mouth once a day LORAZEPAM 1 MG TABS (LORAZEPAM) prn NOVOLOG FLEXPEN 100 UNIT/ML SOLN (INSULIN ASPART) three times a day as directed ONETOUCH ULTRA TEST  STRP (GLUCOSE BLOOD) tid POTASSIUM CHLORIDE CRYS CR 20 MEQ TBCR (POTASSIUM CHLORIDE CRYS CR) Take 1 tablet by mouth once a day PREDNISONE 10 MG  TABS (PREDNISONE) 1 once daily REGLAN 10 MG TABS (METOCLOPRAMIDE HCL) Take 1 tablet by mouth twice a  day ZEGERID 40-1100 MG CAPS (OMEPRAZOLE-SODIUM BICARBONATE) 2 by mouth daily CRESTOR 20 MG TABS (ROSUVASTATIN CALCIUM) 1 by mouth once daily QVAR 80 MCG/ACT  AERS (BECLOMETHASONE DIPROPIONATE) two puffs twice daily PEN NEEDLES 31G X 8 MM  MISC (INSULIN PEN NEEDLE) USE AS DIRECTED NEED F/U APPT B-4 ADDTIONAL REFILLS PREMARIN 0.3 MG TABS (ESTROGENS CONJUGATED) once daily REMICADE 100 MG  SOLR (INFLIXIMAB) Injection every 6 weeks FLUTICASONE PROPIONATE 50 MCG/ACT  SUSP (FLUTICASONE PROPIONATE) 2 sprays each nostril once daily   Past Sur    Current Allergies (reviewed today): ! METHOTREXATE   Family History:    Reviewed history from 09/15/2007 and no changes required:       father-alive MI, DM       mother alive and well       uncle with SARCOID  Social History:    Reviewed history from 05/31/2007 and no changes required:       Married       Never Smoked       Regular exercise-no       Alcohol use-no     Physical Exam  General:     Well-developed,well-nourished,in no acute distress; alert,appropriate and cooperative throughout examination Head:     normocephalic and atraumatic.   Eyes:     pupils equal and pupils round.   Ears:     R ear normal and  L ear normal.   Neck:     No deformities, masses, or tenderness noted. Chest Wall:     No deformities, masses, or tenderness noted. Lungs:     normal respiratory effort and no intercostal retractions.   Heart:     normal rate, regular rhythm, and no gallop.   Abdomen:     Bowel sounds positive,abdomen soft and non-tender without masses, organomegaly or hernias noted. Msk:     No deformity or scoliosis noted of thoracic or lumbar spine.   Extremities:     No clubbing, cyanosis, edema, or deformity noted  Neurologic:     cranial nerves II-XII intact and gait normal.      Impression & Recommendations:  Problem # 1:  EDEMA (ICD-782.3) add lasix will consider d/c chlorthalidone next ov Her updated medication list  for this problem includes:    Atenolol-chlorthalidone 50-25 Mg Tabs (Atenolol-chlorthalidone) .[name]... 1/1/2 daily    Furosemide 20 Mg Tabs (Furosemide) .[name]... Take one (1) by mouth daily   Complete Medication List: 1)  Proair Hfa 108 (90 Base) Mcg/act Aers (Albuterol sulfate) .... 2 puffs three times a day as needed 2)  Atenolol-chlorthalidone 50-25 Mg Tabs (Atenolol-chlorthalidone) .... 1/1/2 daily 3)  Budeprion Xl 300 Mg Tb24 (Bupropion hcl) .... Take 1 tablet by mouth once a day 4)  Effexor Xr 75 Mg Cp24 (Venlafaxine hcl) .... Take 1 capsule by mouth once a day 5)  Lorazepam 1 Mg Tabs (Lorazepam) .... Prn 6)  Novolog Flexpen 100 Unit/ml Soln (Insulin aspart) .... Three times a day as directed 7)  Onetouch Ultra Test Strp (Glucose blood) .... Tid 8)  Potassium Chloride Crys Cr 20 Meq Tbcr (Potassium chloride crys cr) .... Take 1 tablet by mouth two times a day 9)  Prednisone 10 Mg Tabs (Prednisone) .[name].. 1 1/2 once daily 10)  Reglan 10 Mg Tabs (Metoclopramide hcl) .... Take 1 tablet by mouth twice a day 11)  Zegerid 40-1100 Mg Caps (Omeprazole-sodium bicarbonate) .... 2 by mouth daily 12)  Crestor 20 Mg Tabs (Rosuvastatin calcium) .[name].. 1 by mouth once daily 13)  Qvar 80 Mcg/act Aers (Beclomethasone dipropionate) .... Two puffs twice daily 14)  Pen Needles 31g X 8 Mm Misc (Insulin pen needle) .... Use as directed need f/u appt b-4 addtional refills 15)  Premarin 0.3 Mg Tabs (Estrogens conjugated) .... Once daily 16)  Remicade 100 Mg Solr (Infliximab) .... Injection every 6 weeks 17)  Fluticasone Propionate 50 Mcg/act Susp (Fluticasone propionate) .... 2 sprays each nostril once daily 18)  Tussionex Pennkinetic Er 8-10 Mg/23ml Lqcr (Chlorpheniramine-hydrocodone) .[name].. 1 tsp two times a day as needed 19)  Furosemide 20 Mg Tabs (Furosemide) .... Take one (1) by mouth daily   Patient Instructions: 1)  1-2 weeks    Prescriptions: POTASSIUM CHLORIDE CRYS CR 20 MEQ TBCR (POTASSIUM CHLORIDE CRYS  CR) Take 1 tablet by mouth two times a day  #200 x 3   Entered and Authorized by:   Birdie Sons MD   Signed by:   Birdie Sons MD on 02/12/2008   Method used:   Electronically sent to ...       [facility]*       [address]       [place], [place]  [address]       Ph: [phone]       Fax: [phone]   RxID:   [id number] FUROSEMIDE 20 MG TABS (FUROSEMIDE) Take one (1)  by mouth daily  #30 x 3   Entered and Authorized by:   Birdie Sons MD   Signed by:   Birdie Sons MD on 02/12/2008   Method used:   Electronically sent to ...       [facility]*       [address]       [place], [place]  [address]       Ph: [phone]       Fax: [phone]   RxID:   [id number]  ]

## 2010-11-12 NOTE — Assessment & Plan Note (Signed)
Summary: laryngitis/dm   Vital Signs:  Patient Profile:   50 Years Old Female Height:     66 inches Temp:     98.3 degrees F Pulse rate:   82 / minute BP sitting:   138 / 78  (left arm)  Vitals Entered By: Gladis Riffle, RN (February 06, 2008 2:08 PM)                 Chief Complaint:  c/o laryngitis and cough x 2-3 days --worsening.  Acute Visit History:      The patient complains of cough and sore throat.  These symptoms began 3 days ago.  She denies fever.  Other comments include: one day of larynigitis.        The cough interferes with her sleep.  The character of the cough is described as nonproductive.  There is no history of wheezing, shortness of breath, respiratory retractions, tachypnea, cyanosis, or interference with oral intake associated with her cough.        There is no history of dysphagia, drooling, cold/URI symptoms, or recent exposure to strep associated with the sore throat.           Current Allergies (reviewed today): ! METHOTREXATE  Past Medical History:    Reviewed history from 01/16/2008 and no changes required:       Anemia-NOS       Diabetes mellitus, type II       GERD       Hyperlipidemia       Hypertension       pulmonary nodule       OSA       Vocal Cord dysfunction with recurrent cough       Asthma vs Vocal cord dysfundtion       SARCOID              Current Meds:        PROAIR HFA 108 (90 BASE) MCG/ACT  AERS (ALBUTEROL SULFATE) 2 puffs three times a day as needed       ATENOLOL-CHLORTHALIDONE 50-25 MG TABS (ATENOLOL-CHLORTHALIDONE) 1/1/2 daily       BUDEPRION XL 300 MG TB24 (BUPROPION HCL) Take 1 tablet by mouth once a day       EFFEXOR XR 75 MG CP24 (VENLAFAXINE HCL) Take 1 capsule by mouth once a day       LORAZEPAM 1 MG TABS (LORAZEPAM) prn       NOVOLOG FLEXPEN 100 UNIT/ML SOLN (INSULIN ASPART) three times a day as directed       ONETOUCH ULTRA TEST  STRP (GLUCOSE BLOOD) tid       POTASSIUM CHLORIDE CRYS CR 20 MEQ TBCR (POTASSIUM  CHLORIDE CRYS CR) Take 1 tablet by mouth once a day       PREDNISONE 10 MG  TABS (PREDNISONE) 1 once daily       REGLAN 10 MG TABS (METOCLOPRAMIDE HCL) Take 1 tablet by mouth twice a day       ZEGERID 40-1100 MG CAPS (OMEPRAZOLE-SODIUM BICARBONATE) 2 by mouth daily       CRESTOR 20 MG TABS (ROSUVASTATIN CALCIUM) 1 by mouth once daily       QVAR 80 MCG/ACT  AERS (BECLOMETHASONE DIPROPIONATE) two puffs twice daily       PEN NEEDLES 31G X 8 MM  MISC (INSULIN PEN NEEDLE) USE AS DIRECTED NEED F/U APPT B-4 ADDTIONAL REFILLS       PREMARIN 0.3 MG TABS (ESTROGENS CONJUGATED) once daily  REMICADE 100 MG  SOLR (INFLIXIMAB) Injection every 6 weeks       FLUTICASONE PROPIONATE 50 MCG/ACT  SUSP (FLUTICASONE PROPIONATE) 2 sprays each nostril once daily         Past Surgical History:    Reviewed history from 08/21/2006 and no changes required:       Ventral Hernia Repair       Nissen Fundoplication       Cholecystectomy       Hysterectomy   Family History:    Reviewed history from 09/15/2007 and no changes required:       father-alive MI, DM       mother alive and well       uncle with SARCOID  Social History:    Reviewed history from 05/31/2007 and no changes required:       Married       Never Smoked       Regular exercise-no       Alcohol use-no    Review of Systems       chronic fatigue,no other complaints in a complete ROS    Physical Exam  General:     Well-developed,well-nourished,in no acute distress; alert,appropriate and cooperative throughout examination Head:     normocephalic and atraumatic.   Eyes:     pupils equal and pupils round.   Ears:     R ear normal and L ear normal.   Neck:     No deformities, masses, or tenderness noted. Lungs:     Normal respiratory effort, chest expands symmetrically. Lungs are clear to auscultation, no crackles or wheezes. Heart:     Normal rate and regular rhythm. S1 and S2 normal without gallop, murmur, click, rub or other  extra sounds.    Impression & Recommendations:  Problem # 1:  URI (ICD-465.9) I think she has a viral upper respiratory infection. No evidence of bacterial infection. Will treat as such. She will call me for fever, chills, shortness of breath or any other concerns. Her updated medication list for this problem includes:    Maxichlor Dm 4-20 Mg Tabs (Chlorpheniramine-dm) .[name]..[name] Two times a day    Tussionex Pennkinetic Er 8-10 Mg/81ml Lqcr (Chlorpheniramine-hydrocodone) .[name]... 1 tsp two times a day as needed   Complete Medication List: 1)  Proair Hfa 108 (90 Base) Mcg/act Aers (Albuterol sulfate) .... 2 puffs three times a day as needed 2)  Atenolol-chlorthalidone 50-25 Mg Tabs (Atenolol-chlorthalidone) .... 1/1/2 daily 3)  Budeprion Xl 300 Mg Tb24 (Bupropion hcl) .... Take 1 tablet by mouth once a day 4)  Effexor Xr 75 Mg Cp24 (Venlafaxine hcl) .... Take 1 capsule by mouth once a day 5)  Lorazepam 1 Mg Tabs (Lorazepam) .... Prn 6)  Novolog Flexpen 100 Unit/ml Soln (Insulin aspart) .... Three times a day as directed 7)  Onetouch Ultra Test Strp (Glucose blood) .... Tid 8)  Potassium Chloride Crys Cr 20 Meq Tbcr (Potassium chloride crys cr) .... Take 1 tablet by mouth once a day 9)  Prednisone 10 Mg Tabs (Prednisone) .[name].. 1 1/2 once daily 10)  Reglan 10 Mg Tabs (Metoclopramide hcl) .... Take 1 tablet by mouth twice a day 11)  Zegerid 40-1100 Mg Caps (Omeprazole-sodium bicarbonate) .... 2 by mouth daily 12)  Crestor 20 Mg Tabs (Rosuvastatin calcium) .[name].. 1 by mouth once daily 13)  Qvar 80 Mcg/act Aers (Beclomethasone dipropionate) .... Two puffs twice daily 14)  Pen Needles 31g X 8 Mm Misc (Insulin pen needle) .... Use as  directed need f/u appt b-4 addtional refills 15)  Premarin 0.3 Mg Tabs (Estrogens conjugated) .... Once daily 16)  Remicade 100 Mg Solr (Infliximab) .... Injection every 6 weeks 17)  Fluticasone Propionate 50 Mcg/act Susp (Fluticasone propionate) .... 2 sprays each nostril  once daily 18)  Maxichlor Dm 4-20 Mg Tabs (Chlorpheniramine-dm) .... Two times a day 19)  Tussionex Pennkinetic Er 8-10 Mg/60ml Lqcr (Chlorpheniramine-hydrocodone) .[name].. 1 tsp two times a day as needed     Prescriptions: TUSSIONEX PENNKINETIC ER 8-10 MG/5ML LQCR (CHLORPHENIRAMINE-HYDROCODONE) 1 tsp two times a day as needed  #4 oz x 0   Entered and Authorized by:   Birdie Sons MD   Signed by:   Birdie Sons MD on 02/06/2008   Method used:   Print then Give to Patient   RxID:   [id number] MAXICHLOR DM 4-20 MG  TABS (CHLORPHENIRAMINE-DM) two times a day  #10 x 0   Entered and Authorized by:   Birdie Sons MD   Signed by:   Birdie Sons MD on 02/06/2008   Method used:   Electronically sent to ...       [facility]       [address]       [place], [place]  [address]       Ph: [phone]       Fax: [phone]   RxID:   [id number]  ]

## 2010-11-12 NOTE — Assessment & Plan Note (Signed)
Summary: HEAD CONGESTION/ST/NJR   Vital Signs:  Patient Profile:   50 Years Old Female Weight:      271 pounds Temp:     97.8 degrees F oral BP sitting:   132 / 74  Vitals Entered By: Lynann Beaver CMA (July 05, 2007 8:13 AM)                 Chief Complaint:  URI and chest congestion.  Acute Visit History:      The patient complains of cough.  She denies fever.  Other comments include: Patient with long hx of cough---etiology unclear: hx of sarcoid, vocal cord dysfunction. Cough recurred approx 2-3 days ago. She has also re-developed chest pain. She reports starting MTX for sarcoid---reaction of mouth sores. Stopped MTX one week. She currently is on prednisone 10 mg by mouth once daily. .        The character of the cough is described as nonproductive.  There is no history of wheezing, sleep interference, shortness of breath, respiratory retractions, tachypnea, cyanosis, or interference with oral intake associated with her cough.         Current Allergies (reviewed today): ! METHOTREXATE  Past Medical History:    Reviewed history from 08/21/2006 and no changes required:       Anemia-NOS       Diabetes mellitus, type II       GERD       Hyperlipidemia       Hypertension       pulmonary nodule       OSA       Vocal Cord dysfunction with recurrent cough       Asthma vs Vocal cord dysfundtion  Past Surgical History:    Reviewed history from 08/21/2006 and no changes required:       Ventral Hernia Repair       Nissen Fundoplication       Cholecystectomy       Hysterectomy   Family History:    Reviewed history from 05/31/2007 and no changes required:       father-alive MI, DM       mother alive and well  Social History:    Reviewed history from 05/31/2007 and no changes required:       Married       Never Smoked       Regular exercise-no       Alcohol use-no    Review of Systems       no other complaints.    Physical Exam  General:  Well-developed,well-nourished,in no acute distress; alert,appropriate and cooperative throughout examination Head:     normocephalic and atraumatic.   bleeding lesion upper lip on left. Eyes:     pupils equal and pupils round.   Ears:     R ear normal and L ear normal.   Neck:     No deformities, masses, or tenderness noted. Chest Wall:     No deformities, masses, or tenderness noted. Lungs:     Normal respiratory effort, chest expands symmetrically. Lungs are clear to auscultation, no crackles or wheezes. Heart:     Normal rate and regular rhythm. S1 and S2 normal without gallop, murmur, click, rub or other extra sounds. Abdomen:     Bowel sounds positive,abdomen soft and non-tender without masses, organomegaly or hernias noted.  obese Msk:     No deformity or scoliosis noted of thoracic or lumbar spine.   Pulses:     R  and L carotid,radial,femoral,dorsalis pedis and posterior tibial pulses are full and equal bilaterally Neurologic:     No cranial nerve deficits noted. Station and gait are normal.Sensory, motor and coordinative functions appear intact. Skin:     Intact without suspicious lesions or rashes Cervical Nodes:     No lymphadenopathy noted Axillary Nodes:     No palpable lymphadenopathy Inguinal Nodes:     No significant adenopathy Psych:     Cognition and judgment appear intact. Alert and cooperative with normal attention span and concentration. No apparent delusions, illusions, hallucinations    Impression & Recommendations:  Problem # 1:  SARCOIDOSIS (ICD-135) probably the cause of her chest discomfort and recurrent cough. However, she does have documented vocal cord dysfunction. For now will increase Prednsione to 20 mg by mouth once daily---she has f/u with dr Delford Field 10/8. I'll see her back shortly after that.  Problem # 2:  DIABETES MELLITUS, TYPE II (ICD-250.00) CBGs are in the 110-150 range. i;ve asked her to follow closely while on higher dose  prednisone.  Her updated medication list for this problem includes:    Novolog Flexpen 100 Unit/ml Soln (Insulin aspart)   Problem # 3:  HYPERTENSION (ICD-401.9)  Her updated medication list for this problem includes:    Atenolol-chlorthalidone 50-25 Mg Tabs (Atenolol-chlorthalidone) .[name]... 1/1/2 daily    Amlodipine Besylate 10 Mg Tabs (Amlodipine besylate) ..... One tablet daily  BP today: 132/74 Prior BP: 158/80 (05/31/2007)  Prior 10 Yr Risk Heart Disease: 8 % (09/21/2006)  Labs Reviewed: Creat: 0.7 (06/21/2007) Chol: 267 (05/24/2007)   HDL: 37.5 (05/24/2007)   LDL: DEL (05/24/2007)   TG: 349 (05/24/2007)   Problem # 4:  HYPERLIPIDEMIA (ICD-272.4)  The following medications were removed from the medication list:    Simvastatin 40 Mg Tabs (Simvastatin) ..... One tablet daily  Her updated medication list for this problem includes:    Simvastatin 40 Mg Tabs (Simvastatin) .[name]... Take one tablet at bedtime   Complete Medication List: 1)  Albuterol 90 Mcg/act Aers (Albuterol) .... Inhale 2 puff as directed three times a day 2)  Atenolol-chlorthalidone 50-25 Mg Tabs (Atenolol-chlorthalidone) .... 1/1/2 daily 3)  Budeprion Xl 300 Mg Tb24 (Bupropion hcl) .... Take 1 tablet by mouth once a day 4)  Effexor Xr 75 Mg Cp24 (Venlafaxine hcl) .... Take 1 capsule by mouth once a day 5)  Lorazepam 1 Mg Tabs (Lorazepam) .... Prn 6)  Novolog Flexpen 100 Unit/ml Soln (Insulin aspart) 7)  Onetouch Ultra Test Strp (Glucose blood) 8)  Potassium Chloride Crys Cr 20 Meq Tbcr (Potassium chloride crys cr) .... Take 1 tablet by mouth once a day 9)  Prednisone 20 Mg Tabs (Prednisone) .[name].. 1 by mouth once daily 10)  Reglan 10 Mg Tabs (Metoclopramide hcl) .... Take 1 tablet by mouth twice a day 11)  Zegerid 40-1100 Mg Caps (Omeprazole-sodium bicarbonate) .... 2 by mouth daily 12)  Simvastatin 40 Mg Tabs (Simvastatin) .... Take one tablet at bedtime 13)  Qvar 80 Mcg/act Aers (Beclomethasone dipropionate)  .... Two puffs twice daily 14)  Amlodipine Besylate 10 Mg Tabs (Amlodipine besylate) .... One tablet daily     Prescriptions: PREDNISONE 20 MG TABS (PREDNISONE) 1 by mouth once daily  #30 x 2   Entered and Authorized by:   Birdie Sons MD   Signed by:   Birdie Sons MD on 07/05/2007   Method used:   Electronically sent to ...       [facility] [address]  [address]       [place], [place]  [address]       Ph: [phone]       Fax: [phone]   RxID:   [id number]  ]

## 2010-11-12 NOTE — Progress Notes (Signed)
Summary: hosp test results  Phone Note Call from Patient Call back at [phone]   Caller: Spouse-William Call For: wright Reason for Call: Talk to Doctor Summary of Call: pt's husband ask that you call him re: wife's hosp/ stay.  What was found?  Kept her 2-3 days for coughing and wheezing.  Would like to know what sputem test found. Initial call taken by: Eugene Gavia,  April 30, 2009 1:46 PM  Follow-up for Phone Call        [facility]. Gweneth Dimitri RN  April 30, 2009 2:11 PM  Spoke with pt's husband and made aware that PW ut of the office today, will return tommorrow.  Will route msg to PW.  Please advise sputum cx results thanks! Vernie Murders  April 30, 2009 3:36 PM   Additional Follow-up for Phone Call Additional follow up Details #1::        Sputum culture was negative. She does not have an infection No antibiotic was needed She had cyclic cough and volume depletion from viral gastroenteritis.   No other pathology found keep f/u appts  pw Additional Follow-up by: Storm Frisk MD,  April 30, 2009 3:47 PM    Additional Follow-up for Phone Call Additional follow up Details #2::    Spoke with pt's husband and informed him of results per PW.  He will make sure pt keep her f/u appts. Follow-up by: Vernie Murders,  April 30, 2009 3:55 PM

## 2010-11-12 NOTE — Progress Notes (Signed)
Summary: Macrodantin  Phone Note Call from Patient   Caller: Patient Call For: Dr. Cato Mulligan Summary of Call: [name] on Nitrofurantin 100 mg. from her Urologist.  Do you still want her to take the Macrodantin? [phone] Initial call taken by: Lynann Beaver CMA,  July 09, 2008 10:11 AM  Follow-up for Phone Call        yes---per my instructions Follow-up by: Birdie Sons MD,  July 09, 2008 10:18 AM  Additional Follow-up for Phone Call Additional follow up Details #1::        Pt given Dr. Cato Mulligan recommendations. Additional Follow-up by: Lynann Beaver CMA,  July 09, 2008 11:03 AM

## 2010-11-12 NOTE — Assessment & Plan Note (Signed)
Summary: Pulmonary OV   Copy to:  Dr. Delford Field Primary Provider/Referring Provider:  Birdie Sons, MD   CC:  2 month followup.  Pt states that her breathing has been okay.  She still c/o "soreness in chest"- pt states this is unchanges since last ov.  She denies any new complaints today.[name]  History of Present Illness: 50 year old, white female with chronic pulmonary sarcoidosis.  This is been manifested by cyclic cough and mediastinal adenopathy with associated chest pain.  There are also stable pulmonary nodules.  She has been intolerant of systemic corticosteroids &  had adverse side effects to Arava(leflunamide). She was referred the patient in November 2008  to Dr. Azzie Roup,  for Remicade injections.  There has been a positive response to Rx. She had severe GERD s/p fundoplication in 2001.  October 20, 2009 5:17 PM  Sleep consult Epworth Sleepiness Score 9/24.  Gained 10 lbs in the last 2 years. Reviewed overnight PSG >> moderate obstructive sleep apnea with predominant hypopneas & RERAs, RDI 25/h,AHI 15/h with nadir desatn to 89%, no arrythmias, PLms.  December 09, 2009 3:59 PM  reviewed & discussed download 1/27 - 2/9 >> avg pr 7 cm, no residual events, needs to improve usage wt 268, lost 20 lbs on wt watchers. Mask Ok, pressure Ok  December 23, 2009 10:32 AM  f/u and cough is ok.   dyspnea is ok.  notes some ant chest discomfort,  is ache like pain.  on weight watchers and lost 20#  Preventive Screening-Counseling & Management  Alcohol-Tobacco     Smoking Status: never  Current Medications (verified): 1)  Atenolol-Chlorthalidone 50-25 Mg Tabs (Atenolol-Chlorthalidone) .... Take 1 Tablet By Mouth Once A Day 2)  Effexor Xr 75 Mg Cp24 (Venlafaxine Hcl) .... Take 1 Tablet By Mouth Once A Day 3)  Lorazepam 1 Mg Tabs (Lorazepam) .... One Every 6 Hrs As Needed 4)  Novolog Flexpen 100 Unit/ml Soln (Insulin Aspart) .... Three Times A Day As Directed 5)  Onetouch Ultra Test  Strp (Glucose  Blood) .... Tid 6)  Potassium Chloride Crys Cr 20 Meq Tbcr (Potassium Chloride Crys Cr) .[name].. 1 Three Times A Day 7)  Zegerid 40-1100 Mg Caps (Omeprazole-Sodium Bicarbonate) .... Take 1 Tablet By Mouth Once A Day 8)  Pen Needles 31g X 8 Mm  Misc (Insulin Pen Needle) .... Use As Directed 9)  Remicade 100 Mg  Solr (Infliximab) .... Injection Every 6 Weeks 10)  Fluticasone Propionate 50 Mcg/act  Susp (Fluticasone Propionate) .... 2 Sprays Each Nostril Once Daily 11)  Furosemide 20 Mg Tabs (Furosemide) .... Take One (1) By Mouth Daily 12)  Vicodin 5-500 Mg  Tabs (Hydrocodone-Acetaminophen) .... One Tab Every 6 Hours As Needed 13)  Estrace 1 Mg Tabs (Estradiol) .... Take 1 Tablet By Mouth Once A Day 14)  Restasis 0.05 % Emul (Cyclosporine) .[name].. 1 Drop Each Eye Daily 15)  Lotemax 0.5 % Susp (Loteprednol Etabonate) .[name].. 1 Drop Each Eye Every 6 Hours As Needed 16)  Tussionex Pennkinetic Er 8-10 Mg/67ml Lqcr (Chlorpheniramine-Hydrocodone) .[name].. 1 Tsp Every 12 Hr As Needed For Cough, May Make You Sleepy 17)  Simvastatin 40 Mg Tabs (Simvastatin) .... Take One Tablet At Bedtime Start One Week After Completing Diflucan 18)  Magnesium Oxide 250 Mg Tabs (Magnesium Oxide) .... One Tablet By Mouth Once Daily 19)  Levbid 0.375 Mg Xr12h-Tab (Hyoscyamine Sulfate) .[name].. 1 By Mouth Two Times A Day As Needed 20)  Prednisone 5 Mg Tabs (Prednisone) .... Take 1 Tablet  By Mouth Once A Day 21)  Cpap 7 Cm 22)  Diclofenac Sodium 75 Mg Tbec (Diclofenac Sodium) .[name].. 1 By Mouth Two Times A Day After Meals  Allergies (verified): 1)  ! Methotrexate  Past History:  Past medical, surgical, family and social histories (including risk factors) reviewed, and no changes noted (except as noted below).  Past Medical History: Reviewed history from 10/13/2009 and no changes required. EDEMA (ICD-782.3) HEADACHE (ICD-784.0) MENOPAUSAL SYNDROME (ICD-627.2) UTI (ICD-599.0) SARCOIDOSIS (ICD-135)   -CT Chest 6/08 improvement in  Mediastinal LAN   -11/08 start of remecade Rx per [facility] PULMONARY NODULE (ICD-518.89)    -CT Chest 6/08 reduction in size of nodules due to Arava and Pred RX     -probable granulomas due to Sarcoidosis HYPERTENSION (ICD-401.9) HYPERLIPIDEMIA (ICD-272.4) GERD (ICD-530.81) Sleep APnea DIABETES MELLITUS, TYPE II (ICD-250.00) ANEMIA-NOS (ICD-285.9)  Fundic Gland Polyp  Past Surgical History: Reviewed history from 08/21/2006 and no changes required. Ventral Hernia Repair Nissen Fundoplication Cholecystectomy Hysterectomy  Family History: Reviewed history from 10/20/2009 and no changes required. father-alive MI, DM mother alive and well uncle with SARCOID No FH of Colon Cancer: Family History Emphysema-Mother, Father  Family History Asthma-Asthma Family History COPD -mother  Social History: Reviewed history from 10/20/2009 and no changes required. Married, live with husband Never Smoked Regular exercise-no Alcohol use-no Illicit Drug Use - no  Review of Systems       The patient complains of shortness of breath with activity and chest pain.  The patient denies shortness of breath at rest, productive cough, non-productive cough, coughing up blood, irregular heartbeats, acid heartburn, indigestion, loss of appetite, weight change, abdominal pain, difficulty swallowing, sore throat, tooth/dental problems, headaches, nasal congestion/difficulty breathing through nose, sneezing, itching, ear ache, anxiety, depression, hand/feet swelling, joint stiffness or pain, rash, change in color of mucus, and fever.    Vital Signs:  Patient profile:   50 year old female Weight:      259 pounds O2 Sat:      97 % on Room air Temp:     97.7 degrees F oral Pulse rate:   70 / minute BP sitting:   120 / 80  (left arm)  Vitals Entered By: Vernie Murders (December 23, 2009 10:24 AM)  O2 Flow:  Room air  Physical Exam  Additional Exam:   GEN: A/Ox3; pleasant , NAD, obese  female.  HEENT:  [place]/AT, , EACs-clear, TMs-wnl, NOSE-mild erythema THROAT-clear NECK:  Supple w/ fair ROM; no JVD; normal carotid impulses w/o bruits; no thyromegaly or nodules palpated; no lymphadenopathy. RESP  Coarse BS w/ no wheeizng, has some mild upper airway psuedowheezing on forced exp.  CARD:  RRR, no m/r/g   [place]: Warm bil,  no calf tenderness edema, clubbing, pulses intact    Impression & Recommendations:  Problem # 1:  SARCOIDOSIS (ICD-135) Assessment Improved improved pulm sarcoidosis on remecade  plan cont remecade reduce pred to 5mg  every other day   Problem # 2:  GERD (ICD-530.81) Assessment: Improved stable gerd  Her updated medication list for this problem includes:    Zegerid 40-1100 Mg Caps (Omeprazole-sodium bicarbonate) .[name]... Take 1 tablet by mouth once a day    Magnesium Oxide 250 Mg Tabs (Magnesium oxide) ..... One tablet by mouth once daily    Levbid 0.375 Mg Xr12h-tab (Hyoscyamine sulfate) .[name]... 1 by mouth two times a day as needed  Medications Added to Medication List This Visit: 1)  Prednisone 5 Mg Tabs (Prednisone) .... Take 1 tablet  by mouth every other day  Complete Medication List: 1)  Atenolol-chlorthalidone 50-25 Mg Tabs (Atenolol-chlorthalidone) .... Take 1 tablet by mouth once a day 2)  Effexor Xr 75 Mg Cp24 (Venlafaxine hcl) .... Take 1 tablet by mouth once a day 3)  Lorazepam 1 Mg Tabs (Lorazepam) .... One every 6 hrs as needed 4)  Novolog Flexpen 100 Unit/ml Soln (Insulin aspart) .... Three times a day as directed 5)  Onetouch Ultra Test Strp (Glucose blood) .... Tid 6)  Potassium Chloride Crys Cr 20 Meq Tbcr (Potassium chloride crys cr) .[name].. 1 three times a day 7)  Zegerid 40-1100 Mg Caps (Omeprazole-sodium bicarbonate) .... Take 1 tablet by mouth once a day 8)  Pen Needles 31g X 8 Mm Misc (Insulin pen needle) .... Use as directed 9)  Remicade 100 Mg Solr (Infliximab) .... Injection every 6 weeks 10)  Fluticasone Propionate 50 Mcg/act  Susp (Fluticasone propionate) .... 2 sprays each nostril once daily 11)  Furosemide 20 Mg Tabs (Furosemide) .... Take one (1) by mouth daily 12)  Vicodin 5-500 Mg Tabs (Hydrocodone-acetaminophen) .... One tab every 6 hours as needed 13)  Estrace 1 Mg Tabs (Estradiol) .... Take 1 tablet by mouth once a day 14)  Restasis 0.05 % Emul (Cyclosporine) .[name].. 1 drop each eye daily 15)  Lotemax 0.5 % Susp (Loteprednol etabonate) .[name].. 1 drop each eye every 6 hours as needed 16)  Tussionex Pennkinetic Er 8-10 Mg/46ml Lqcr (Chlorpheniramine-hydrocodone) .[name].. 1 tsp every 12 hr as needed for cough, may make you sleepy 17)  Simvastatin 40 Mg Tabs (Simvastatin) .... Take one tablet at bedtime start one week after completing diflucan 18)  Magnesium Oxide 250 Mg Tabs (Magnesium oxide) .... One tablet by mouth once daily 19)  Levbid 0.375 Mg Xr12h-tab (Hyoscyamine sulfate) .[name].. 1 by mouth two times a day as needed 20)  Prednisone 5 Mg Tabs (Prednisone) .... Take 1 tablet by mouth every other day 21)  Cpap 7 Cm  22)  Diclofenac Sodium 75 Mg Tbec (Diclofenac sodium) .[name].. 1 by mouth two times a day after meals  Other Orders: Est. Patient Level III (16109)  Patient Instructions: 1)  Reduce prednisone to 5mg  every other day 2)  No other medication changes 3)  Return 3 months

## 2010-11-12 NOTE — Progress Notes (Signed)
Summary: xray  Phone Note Call from Patient   Caller: Patient Call For: Birdie Sons MD Reason for Call: Lab or Test Results Summary of Call: Pt given normal reading on her back xrays. Initial call taken by: Lynann Beaver CMA,  September 11, 2009 10:45 AM

## 2010-11-12 NOTE — Assessment & Plan Note (Signed)
Summary: Pulmonary OV   Copy to:  Chase Picket Primary Sheana Bir/Referring Terren Haberle:  Dr. Birdie Sons  CC:  HFU. Pt states SOB has improved some and but is not gone. pt also states she is still having some chest pain in center of chest when she inhales. Pt also c/o still having dry cough since discharge. [name]  History of Present Illness:  50 year old, white female with chronic pulmonary sarcoidosis.  This is been manifested by cyclic cough and mediastinal adenopathy with associated chest pain.  There are also stable pulmonary nodules.  The patient has been intolerant of systemic corticosteroids.  She also had adverse side effects to Arava(leflunamide).    On this basis we referred the patient in November 2008  to Dr. Azzie Roup,  for Remicade injections.  There has been a positive response to Rx.  December 18, 2008-now on pred 5/d  on remecade every 6weeks The pts cough is better.  There is some chest pain.  Pt notes some sinus pressure.  Some postnasal drip.  May 01, 2009 --Presents for an acute office visit. Admitted 04/27/2009- 04/30/2009 for  Sarcoidosis with acute exacerbation.   She was admitted w/ increased cough, dyspnea and weakness. CT chestt was     negative for PE and showed no intrathoracic abnormalities.   Sputum cx was neg. She was treated w/ steorids, aggressive pulmonary toilet. She did have a lactic acidosis.  w/ lactic acid level as high as 8, last check showed at 2.6. Felt to be from r hyperventilation at the time of admission. She did improve but cough and tightness did not totally resolve. Today she says she has been having dry cough. Discharge summary says pred 10mg  however she is on 5mg  once daily. Dyspnea is worse in heat (temps >90 outside). Denies orthopnea, hemoptysis, fever, n/v/d, edema.  Pain on inspiration and with cough.     May 07, 2009 10:03  at last ov with NP pt was seen post hosp: Stop fish oil.  Change QVAR to Symbicort 160.4.35mcg 2 puffs two times a day  (brush, rinse and gargle after use) Increase Prednisone 20mg  once daily for 1 week, then hold at 10mg  once daily until next office viist.  Mucinex DM two times a day as needed cough/congestion Still on prednisone 20mg /d.  Still with dry cough.  Still with heartburn.  Has dysphagia and hoarseness. No loose stools.  Still dyspneic at rest.      Preventive Screening-Counseling & Management  Alcohol-Tobacco     Smoking Status: never   Current Medications (verified): 1)  Proair Hfa 108 (90 Base) Mcg/act  Aers (Albuterol Sulfate) .... 2 Puffs Three Times A Day As Needed 2)  Atenolol-Chlorthalidone 50-25 Mg Tabs (Atenolol-Chlorthalidone) .... Take 1 Tablet By Mouth Once A Day 3)  Budeprion Xl 300 Mg Tb24 (Bupropion Hcl) .... Take 1 Tablet By Mouth Once A Day 4)  Effexor Xr 75 Mg Cp24 (Venlafaxine Hcl) .... Take 1 Capsule By Mouth Two Times A Day 5)  Lorazepam 1 Mg Tabs (Lorazepam) .... Prn 6)  Novolog Flexpen 100 Unit/ml Soln (Insulin Aspart) .... Three Times A Day As Directed 7)  Onetouch Ultra Test  Strp (Glucose Blood) .... Tid 8)  Potassium Chloride Crys Cr 20 Meq Tbcr (Potassium Chloride Crys Cr) .[name].. 1 Three Times A Day 9)  Prednisone 20 Mg Tabs (Prednisone) .... Take One Tablet Once A Day X 1 Week, Then Half A Tablet Once A Day 10)  Zegerid 40-1100 Mg Caps (Omeprazole-Sodium Bicarbonate) .... One  Twice Daily 11)  Crestor 20 Mg Tabs (Rosuvastatin Calcium) .[name].. 1 By Mouth Once Daily 12)  Pen Needles 31g X 8 Mm  Misc (Insulin Pen Needle) .... Use As Directed 13)  Remicade 100 Mg  Solr (Infliximab) .... Injection Every 6 Weeks 14)  Fluticasone Propionate 50 Mcg/act  Susp (Fluticasone Propionate) .... 2 Sprays Each Nostril Once Daily 15)  Furosemide 20 Mg Tabs (Furosemide) .... Take One (1) By Mouth Daily 16)  Vicodin 5-500 Mg  Tabs (Hydrocodone-Acetaminophen) .... One Tab Every 6 Hours As Needed 17)  Vitamin D 1000 Unit  Caps (Cholecalciferol) .... Two Times A Day 18)  Benicar 40 Mg   Tabs (Olmesartan Medoxomil) .[name].. 1 By Mouth Once Daily 19)  Estrace 1 Mg Tabs (Estradiol) .... Take 1 Tablet By Mouth Once A Day 20)  Slow Release Iron 47.5 Mg Cr-Tabs (Ferrous Sulfate) .... Once Daily 21)  Multiple Vitamin  Tabs (Multiple Vitamin) .... Once Daily 22)  Tussionex Pennkinetic Er 8-10 Mg/87ml Lqcr (Chlorpheniramine-Hydrocodone) .... Take 1 Teaspoon Every 12 Hrs As Needed Cough 23)  Restasis 0.05 % Emul (Cyclosporine) .[name].. 1 Drop Each Eye Daily 24)  Fish Oil 500 Mg Caps (Omega-3 Fatty Acids) .... Once Daily 25)  Lotemax 0.5 % Susp (Loteprednol Etabonate) .[name].. 1 Drop Each Eye Every 6 Hours As Needed 26)  Utac 500-500 Mg Tabs (Methenamine Mand-Sod Phosphate) .... Two Times A Day 27)  Symbicort 160-4.5 Mcg/act Aero (Budesonide-Formoterol Fumarate) .... 2 Puffs Two Times A Day  Allergies (verified): 1)  ! Methotrexate  Past History:  Past medical, surgical, family and social histories (including risk factors) reviewed, and no changes noted (except as noted below).  Past Medical History: Reviewed history from 04/21/2009 and no changes required. EDEMA (ICD-782.3) HEADACHE (ICD-784.0) MENOPAUSAL SYNDROME (ICD-627.2) UTI (ICD-599.0) SARCOIDOSIS (ICD-135)   -CT Chest 6/08 improvement in Mediastinal LAN   -11/08 start of remecade Rx per [facility] PULMONARY NODULE (ICD-518.89)    -CT Chest 6/08 reduction in size of nodules due to Arava and Pred RX     -probable granulomas due to Sarcoidosis HYPERTENSION (ICD-401.9) HYPERLIPIDEMIA (ICD-272.4) GERD (ICD-530.81) Sleep APnea DIABETES MELLITUS, TYPE II (ICD-250.00) ANEMIA-NOS (ICD-285.9)    Past Surgical History: Reviewed history from 08/21/2006 and no changes required. Ventral Hernia Repair Nissen Fundoplication Cholecystectomy Hysterectomy  Family History: Reviewed history from 09/15/2007 and no changes required. father-alive MI, DM mother alive and well uncle with SARCOID  Social History: Reviewed  history from 05/31/2007 and no changes required. Married Never Smoked Regular exercise-no Alcohol use-no  Review of Systems       The patient complains of shortness of breath with activity, shortness of breath at rest, non-productive cough, acid heartburn, indigestion, loss of appetite, difficulty swallowing, sore throat, and nasal congestion/difficulty breathing through nose.  The patient denies productive cough, coughing up blood, chest pain, irregular heartbeats, weight change, abdominal pain, tooth/dental problems, headaches, sneezing, itching, ear ache, anxiety, depression, hand/feet swelling, joint stiffness or pain, rash, change in color of mucus, and fever.    Vital Signs:  Patient profile:   50 year old female Height:      66 inches Weight:      276 pounds O2 Sat:      98 % on Room air Temp:     98.1 degrees F oral Pulse rate:   80 / minute BP sitting:   118 / 68  (left arm) Cuff size:   regular  Vitals Entered By: Carron Curie CMA (May 07, 2009 9:56  AM)  O2 Flow:  Room air CC: HFU. Pt states SOB has improved some, but is not gone. pt also states she is still having some chest pain in center of chest when she inhales. Pt also c/o still having dry cough since discharge.  Comments Medications reviewed with patient Carron Curie CMA  May 07, 2009 9:56 AM    Physical Exam  Additional Exam:   GEN: A/Ox3; pleasant , NAD, obese female.  HEENT:  [place]/AT, , EACs-clear, TMs-wnl, NOSE-clear, THROAT-clear NECK:  Supple w/ fair ROM; no JVD; normal carotid impulses w/o bruits; no thyromegaly or nodules palpated; no lymphadenopathy. RESP  Coarse BS w/ no wheeizng, has some mild upper airway psuedowheezing on forced exp.  CARD:  RRR, no m/r/g   GI:   Soft & nt; nml bowel sounds; no organomegaly or masses detected. Musco: Warm bil,  no calf tenderness edema, clubbing, pulses intact Neuro: intact w/ no focal deficits.    Pulmonary Function Test Date: 05/07/2009 Gender:  Female  Pre-Spirometry FVC    Value: 2.30 L/min   Pred: 3.56 L/min     % Pred: 64 % FEV1    Value: 1.84 L     Pred: 2.72 L     % Pred: 67 % FEV1/FVC  Value: 80 %     Pred: 107 %     Comments: Restrictive defect,  no obstruction  Impression & Recommendations:  Problem # 1:  CANDIDIASIS (ICD-112.9) Assessment Deteriorated Oral candidiasis and upper airway instability with cyclical cough syndrome.  Severe GERD and possible esophageal candidiasis as a factor  plan: GI referral Oral diflucan Cyclic cough protocol reduce prednisone dosage add zantac to zegerid stop symbicort   Orders: Est. Patient Level V (21308) Prescription Created Electronically [phone]) Gastroenterology Referral (GI)  Medications Added to Medication List This Visit: 1)  Prednisone 20 Mg Tabs (Prednisone) .... Take one tablet once a day x 1 week, then half a tablet once a day 2)  Prednisone 20 Mg Tabs (Prednisone) .... One half daily 3)  Tussionex Pennkinetic Er 8-10 Mg/57ml Lqcr (Chlorpheniramine-hydrocodone) .... Take 1 teaspoon every 12 hrs as needed cough 4)  Symbicort 160-4.5 Mcg/act Aero (Budesonide-formoterol fumarate) .... 2 puffs two times a day 5)  Zantac 150 Mg Tabs (Ranitidine hcl) .... One daily 6)  Tessalon Perles 100 Mg Caps (Benzonatate) .... One to two by mouth 3-4 times daily 7)  Diflucan 100 Mg Tabs (Fluconazole) .... Take two by mouth times one dose then one daily until gone  Complete Medication List: 1)  Proair Hfa 108 (90 Base) Mcg/act Aers (Albuterol sulfate) .... 2 puffs three times a day as needed 2)  Atenolol-chlorthalidone 50-25 Mg Tabs (Atenolol-chlorthalidone) .... Take 1 tablet by mouth once a day 3)  Budeprion Xl 300 Mg Tb24 (Bupropion hcl) .... Take 1 tablet by mouth once a day 4)  Effexor Xr 75 Mg Cp24 (Venlafaxine hcl) .... Take 1 capsule by mouth two times a day 5)  Lorazepam 1 Mg Tabs (Lorazepam) .... Prn 6)  Novolog Flexpen 100 Unit/ml Soln (Insulin aspart) .... Three times a  day as directed 7)  Onetouch Ultra Test Strp (Glucose blood) .... Tid 8)  Potassium Chloride Crys Cr 20 Meq Tbcr (Potassium chloride crys cr) .[name].. 1 three times a day 9)  Prednisone 20 Mg Tabs (Prednisone) .... One half daily 10)  Zegerid 40-1100 Mg Caps (Omeprazole-sodium bicarbonate) .... One twice daily 11)  Crestor 20 Mg Tabs (Rosuvastatin calcium) .[name].. 1 by mouth once daily 12)  Pen Needles 31g X 8 Mm Misc (Insulin pen needle) .... Use as directed 13)  Remicade 100 Mg Solr (Infliximab) .... Injection every 6 weeks 14)  Fluticasone Propionate 50 Mcg/act Susp (Fluticasone propionate) .... 2 sprays each nostril once daily 15)  Furosemide 20 Mg Tabs (Furosemide) .... Take one (1) by mouth daily 16)  Vicodin 5-500 Mg Tabs (Hydrocodone-acetaminophen) .... One tab every 6 hours as needed 17)  Vitamin D 1000 Unit Caps (Cholecalciferol) .... Two times a day 18)  Estrace 1 Mg Tabs (Estradiol) .... Take 1 tablet by mouth once a day 19)  Slow Release Iron 47.5 Mg Cr-tabs (Ferrous sulfate) .... Once daily 20)  Multiple Vitamin Tabs (Multiple vitamin) .... Once daily 21)  Tussionex Pennkinetic Er 8-10 Mg/6ml Lqcr (Chlorpheniramine-hydrocodone) .... Take 1 teaspoon every 12 hrs as needed cough 22)  Restasis 0.05 % Emul (Cyclosporine) .[name].. 1 drop each eye daily 23)  Lotemax 0.5 % Susp (Loteprednol etabonate) .[name].. 1 drop each eye every 6 hours as needed 24)  Utac 500-500 Mg Tabs (Methenamine mand-sod phosphate) .... Two times a day 25)  Zantac 150 Mg Tabs (Ranitidine hcl) .... One daily 26)  Tessalon Perles 100 Mg Caps (Benzonatate) .... One to two by mouth 3-4 times daily 27)  Diflucan 100 Mg Tabs (Fluconazole) .... Take two by mouth times one dose then one daily until gone  Other Orders: Spirometry w/Graph (94010)  Patient Instructions: 1)  Stop fish oil.  2)  Stop symbicort 3)  Reduce prednisone to 10mg  daily 4)  Diflucan 200mg  one day then 100mg  daily until gone 5)  Add Zantac 150mg  daily to  zegerid 6)  Appt with Dr Marina Goodell GI 7)  Follow Reflux diet 8)  Cyclic cough protocol 9)  Return 2-3 weeks Prescriptions: DIFLUCAN 100 MG TABS (FLUCONAZOLE) Take two by mouth times one dose then one daily until gone  #7 x 0   Entered and Authorized by:   Storm Frisk MD   Signed by:   Storm Frisk MD on 05/07/2009   Method used:   Electronically to        [facility] (retail)       [address]       [place], [place]  [address]       Ph: [phone]       Fax: [phone]   RxID:   [id number] TESSALON PERLES 100 MG  CAPS (BENZONATATE) One to two by mouth 3-4 times daily  #90 x 6   Entered and Authorized by:   Storm Frisk MD   Signed by:   Storm Frisk MD on 05/07/2009   Method used:   Electronically to        [facility] (retail)       [address]       [place], [place]  [address]       Ph: [phone]       Fax: [phone]   RxID:   [id number] Sandria Senter ER 8-10 MG/5ML LQCR (CHLORPHENIRAMINE-HYDROCODONE) Take 1 teaspoon every 12 hrs as needed cough  #240 ml x 1   Entered and Authorized by:   Storm Frisk MD   Signed by:   Storm Frisk MD on 05/07/2009   Method used:   Print then Give to Patient   RxID:   [id number] ZANTAC 150 MG TABS (RANITIDINE HCL) One daily  #  30 x 6   Entered and Authorized by:   Storm Frisk MD   Signed by:   Storm Frisk MD on 05/07/2009   Method used:   Electronically to        [facility] (retail)       [address]       [place], [place]  [address]       Ph: [phone]       Fax: [phone]   RxID:   [id number]

## 2010-11-12 NOTE — Letter (Signed)
Summary: Diabetic Instructions  [facility]  [address] [place], [place] [address]   Phone: [phone]  Fax: [phone]    TEIANA HAJDUK 10/14/1960 MRN: [id number]     x INSULIN (SHORT ACTING) MEDICATION INSTRUCTIONS (Regular, Humulog, Novolog)   The day before your procedure:   Do not take your evening dose   The day of your procedure:   Do not take your morning dose

## 2010-11-12 NOTE — Progress Notes (Signed)
Summary: pain in neck not better   Phone Note Call from Patient Call back at [phone]   Caller: patient live Call For: [place] Summary of Call: Was in last Tuesday with pain in her neck.  Was told to call if not feeling better.  She is not feeling better  Initial call taken by: Roselle Locus,  November 13, 2007 10:18 AM  Follow-up for Phone Call        schedule CT soft tissue of neck with contrast  request sent to [facility] Follow-up by: Birdie Sons MD,  November 13, 2007 1:13 PM  Additional Follow-up for Phone Call Additional follow up Details #1::        Notified pt that Patsy Lager will call her with CT appt. Additional Follow-up by: Lynann Beaver CMA,  November 13, 2007 1:55 PM         Appended Document: pain in neck not better 02/12 @ 2:00  patient aware

## 2010-11-12 NOTE — Assessment & Plan Note (Signed)
Summary: congestion//ccm   Vital Signs:  Patient profile:   50 year old female Pulse rate:   80 / minute Pulse rhythm:   regular Resp:     20 per minute BP sitting:   120 / 74  (left arm) Cuff size:   large  Vitals Entered By: Gladis Riffle, RN (February 04, 2010 11:39 AM) CC: c/o congestion and cough x 2 days with runny nose Is Patient Diabetic? Yes Did you bring your meter with you today? No   Primary Care Provider:  Birdie Sons, MD   CC:  c/o congestion and cough x 2 days with runny nose.  History of Present Illness: sinus congestion ear pressure dry cough for 2 days   no fever or chills no ill contacts  All other systems reviewed and were negative   Preventive Screening-Counseling & Management  Alcohol-Tobacco     Smoking Status: never  Current Problems (verified): 1)  Dysphagia Unspecified  (ICD-787.20) 2)  Anemia-unspecified  (ICD-285.9) 3)  Back Pain  (ICD-724.5) 4)  Obesity  (ICD-278.00) 5)  Sleep Apnea  (ICD-780.57) 6)  Menopausal Syndrome  (ICD-627.2) 7)  Sarcoidosis  (ICD-135) 8)  Hypertension  (ICD-401.9) 9)  Hyperlipidemia  (ICD-272.4) 10)  Gerd  (ICD-530.81) 11)  Diabetes Mellitus, Type II  (ICD-250.00)  Current Medications (verified): 1)  Atenolol-Chlorthalidone 50-25 Mg Tabs (Atenolol-Chlorthalidone) .... Take 1 Tablet By Mouth Once A Day 2)  Effexor Xr 75 Mg Cp24 (Venlafaxine Hcl) .... Take 1 Tablet By Mouth Once A Day 3)  Lorazepam 1 Mg Tabs (Lorazepam) .... One Every 6 Hrs As Needed 4)  Potassium Chloride Crys Cr 20 Meq Tbcr (Potassium Chloride Crys Cr) .[name].. 1 Three Times A Day 5)  Zegerid 40-1100 Mg Caps (Omeprazole-Sodium Bicarbonate) .... Take 1 Tablet By Mouth Once A Day 6)  Remicade 100 Mg  Solr (Infliximab) .... Injection Every 6 Weeks 7)  Fluticasone Propionate 50 Mcg/act  Susp (Fluticasone Propionate) .... 2 Sprays Each Nostril Once Daily 8)  Furosemide 20 Mg Tabs (Furosemide) .... Take One (1) By Mouth Daily 9)  Vicodin 5-500 Mg  Tabs  (Hydrocodone-Acetaminophen) .... One Tab Every 6 Hours As Needed 10)  Estrace 1 Mg Tabs (Estradiol) .... Take 1 Tablet By Mouth Once A Day 11)  Restasis 0.05 % Emul (Cyclosporine) .[name].. 1 Drop Each Eye Daily 12)  Lotemax 0.5 % Susp (Loteprednol Etabonate) .[name].. 1 Drop Each Eye Every 6 Hours As Needed 13)  Simvastatin 40 Mg Tabs (Simvastatin) .... Take One Tablet At Bedtime Start One Week After Completing Diflucan 14)  Levbid 0.375 Mg Xr12h-Tab (Hyoscyamine Sulfate) .[name].. 1 By Mouth Two Times A Day As Needed 15)  Prednisone 5 Mg Tabs (Prednisone) .... Take 1 Tablet By Mouth Every Other Day 16)  Cpap 7 Cm 17)  Nystatin-Triamcinolone 100000-0.1 Unit/gm-% Crea (Nystatin-Triamcinolone) .... Apply Two Times A Day To Affected Area  Allergies: 1)  ! Methotrexate  Past History:  Past Medical History: Last updated: 10/13/2009 EDEMA (ICD-782.3) HEADACHE (ICD-784.0) MENOPAUSAL SYNDROME (ICD-627.2) UTI (ICD-599.0) SARCOIDOSIS (ICD-135)   -CT Chest 6/08 improvement in Mediastinal LAN   -11/08 start of remecade Rx per [facility] PULMONARY NODULE (ICD-518.89)    -CT Chest 6/08 reduction in size of nodules due to Arava and Pred RX     -probable granulomas due to Sarcoidosis HYPERTENSION (ICD-401.9) HYPERLIPIDEMIA (ICD-272.4) GERD (ICD-530.81) Sleep APnea DIABETES MELLITUS, TYPE II (ICD-250.00) ANEMIA-NOS (ICD-285.9)  Fundic Gland Polyp  Past Surgical History: Last updated: 08/21/2006 Ventral Hernia Repair Nissen Fundoplication Cholecystectomy  Hysterectomy  Family History: Last updated: 10/20/2009 father-alive MI, DM mother alive and well uncle with SARCOID No FH of Colon Cancer: Family History Emphysema-Mother, Father  Family History Asthma-Asthma Family History COPD -mother  Social History: Last updated: 10/20/2009 Married, live with husband Never Smoked Regular exercise-no Alcohol use-no Illicit Drug Use - no  Risk Factors: Exercise: no (05/31/2007)  Risk  Factors: Smoking Status: never (02/04/2010)  Physical Exam  General:  Well-developed,well-nourished,in no acute distress; alert,appropriate and cooperative throughout examinationoverweight-appearing.   Neck:  No deformities, masses, or tenderness noted. Lungs:  normal respiratory effort, no intercostal retractions, normal breath sounds, and no dullness.   Heart:  normal rate and regular rhythm.     Impression & Recommendations:  Problem # 1:  URI (ICD-465.9)  no evidence of bacterial infection. call for any concerns, increased sxs, fever, persistence of sxs, wheeze, SOB.    Her updated medication list for this problem includes:    Hydromet 5-1.5 Mg/54ml Syrp (Hydrocodone-homatropine) .[name]... 1 tsp three times a day as needed cough  Complete Medication List: 1)  Atenolol-chlorthalidone 50-25 Mg Tabs (Atenolol-chlorthalidone) .... Take 1 tablet by mouth once a day 2)  Effexor Xr 75 Mg Cp24 (Venlafaxine hcl) .... Take 1 tablet by mouth once a day 3)  Lorazepam 1 Mg Tabs (Lorazepam) .... One every 6 hrs as needed 4)  Potassium Chloride Crys Cr 20 Meq Tbcr (Potassium chloride crys cr) .[name].. 1 three times a day 5)  Zegerid 40-1100 Mg Caps (Omeprazole-sodium bicarbonate) .... Take 1 tablet by mouth once a day 6)  Remicade 100 Mg Solr (Infliximab) .... Injection every 6 weeks 7)  Fluticasone Propionate 50 Mcg/act Susp (Fluticasone propionate) .... 2 sprays each nostril once daily 8)  Furosemide 20 Mg Tabs (Furosemide) .... Take one (1) by mouth daily 9)  Vicodin 5-500 Mg Tabs (Hydrocodone-acetaminophen) .... One tab every 6 hours as needed 10)  Estrace 1 Mg Tabs (Estradiol) .... Take 1 tablet by mouth once a day 11)  Restasis 0.05 % Emul (Cyclosporine) .[name].. 1 drop each eye daily 12)  Lotemax 0.5 % Susp (Loteprednol etabonate) .[name].. 1 drop each eye every 6 hours as needed 13)  Simvastatin 40 Mg Tabs (Simvastatin) .... Take one tablet at bedtime start one week after completing diflucan 14)  Levbid  0.375 Mg Xr12h-tab (Hyoscyamine sulfate) .[name].. 1 by mouth two times a day as needed 15)  Prednisone 5 Mg Tabs (Prednisone) .... Take 1 tablet by mouth every other day 16)  Cpap 7 Cm  17)  Nystatin-triamcinolone 100000-0.1 Unit/gm-% Crea (Nystatin-triamcinolone) .... Apply two times a day to affected area 18)  Hydromet 5-1.5 Mg/23ml Syrp (Hydrocodone-homatropine) .[name].. 1 tsp three times a day as needed cough Prescriptions: HYDROMET 5-1.5 MG/5ML SYRP (HYDROCODONE-HOMATROPINE) 1 tsp three times a day as needed cough  #120cc x 1   Entered and Authorized by:   Birdie Sons MD   Signed by:   Birdie Sons MD on 02/04/2010   Method used:   Print then Give to Patient   RxID:   [id number]

## 2010-11-12 NOTE — Progress Notes (Signed)
Summary: rx  Phone Note Call from Patient Call back at Home Phone [phone]   Caller: Patient Call For: wright Reason for Call: Refill Medication, Talk to Nurse Summary of Call: need new rx for prednisone sent in.  She was taking 5mg  but [facility]W upped them to 10 mg. [facility] [address] Initial call taken by: Eugene Gavia,  June 05, 2009 9:47 AM  Follow-up for Phone Call        Last ov with PW showed that pt should be on Prednisone 15mg /day.  What strength does she want called in? [id number] Follow-up by: Abigail Miyamoto RN,  June 05, 2009 10:01 AM  Additional Follow-up for Phone Call Additional follow up Details #1::        Pt instructed that [facility] [address]. Abigail Miyamoto RN  June 05, 2009 10:14 AM     Prescriptions: PREDNISONE 10 MG TABS (PREDNISONE) 1 in am   1/2 in pm  #45 x 6   Entered by:   Abigail Miyamoto RN   Authorized by:   Storm Frisk MD   Signed by:   Abigail Miyamoto RN on 06/05/2009   Method used:   Electronically to        [facility] (retail)       [address]       [place], [place]  [address]       Ph: [phone]       Fax: [phone]   RxID:   [id number]

## 2010-11-12 NOTE — Assessment & Plan Note (Signed)
Summary: PER DR. Delford Field ELEV WBC & TO FU DR. SW/PS   Vital Signs:  Patient Profile:   50 Years Old Female Height:     66 inches Weight:      284 pounds Temp:     98.3 degrees F Pulse rate:   96 / minute BP sitting:   118 / 76  (left arm) Cuff size:   large  Vitals Entered By: Gladis Riffle, RN (July 04, 2008 10:13 AM)                 Referred by:  Chase Picket PCP:  Dr. Birdie Sons  Chief Complaint:  appt per dr Delford Field for elevated WBC to be evaluated--c/o flank pain x 1 week and pian upper arms and behind both knees.  History of Present Illness: elevated white count---no signs of sxs of infection, she does have diffuse myalgias no other sxs no rash  Past Medical History: EDEMA (ICD-782.3) HEADACHE (ICD-784.0) MENOPAUSAL SYNDROME (ICD-627.2) UTI (ICD-599.0) SARCOIDOSIS (ICD-135) PULMONARY NODULE (ICD-518.89) HYPERTENSION (ICD-401.9) HYPERLIPIDEMIA (ICD-272.4) GERD (ICD-530.81) DIABETES MELLITUS, TYPE II (ICD-250.00) ANEMIA-NOS (ICD-285.9)    Past Surgical History: Ventral Hernia Repair Nissen Fundoplication Cholecystectomy Hysterectomy  Social History: Married Never Smoked Regular exercise-no Alcohol use-no  Family History: father-alive MI, DM mother alive and well uncle with SARCOID no other complaints in a complete ROS     Prior Medications Reviewed Using: Patient Recall  Prior Medication List:  PROAIR HFA 108 (90 BASE) MCG/ACT  AERS (ALBUTEROL SULFATE) 2 puffs three times a day as needed ATENOLOL-CHLORTHALIDONE 50-25 MG TABS (ATENOLOL-CHLORTHALIDONE) 1/1/2 daily BUDEPRION XL 300 MG TB24 (BUPROPION HCL) Take 1 tablet by mouth once a day EFFEXOR XR 75 MG CP24 (VENLAFAXINE HCL) Take 1 capsule by mouth once a day LORAZEPAM 1 MG TABS (LORAZEPAM) prn NOVOLOG FLEXPEN 100 UNIT/ML SOLN (INSULIN ASPART) three times a day as directed ONETOUCH ULTRA TEST  STRP (GLUCOSE BLOOD) tid POTASSIUM CHLORIDE CRYS CR 20 MEQ TBCR (POTASSIUM CHLORIDE CRYS CR) Take 1  tablet by mouth two times a day PREDNISONE 10 MG  TABS (PREDNISONE) Take 1 tablet by mouth once a day ZEGERID 40-1100 MG CAPS (OMEPRAZOLE-SODIUM BICARBONATE) 2 by mouth daily CRESTOR 20 MG TABS (ROSUVASTATIN CALCIUM) 1 by mouth once daily QVAR 80 MCG/ACT  AERS (BECLOMETHASONE DIPROPIONATE) two puffs twice daily PEN NEEDLES 31G X 8 MM  MISC (INSULIN PEN NEEDLE) USE AS DIRECTED REMICADE 100 MG  SOLR (INFLIXIMAB) Injection every 6 weeks FLUTICASONE PROPIONATE 50 MCG/ACT  SUSP (FLUTICASONE PROPIONATE) 2 sprays each nostril once daily FUROSEMIDE 20 MG TABS (FUROSEMIDE) Take one (1) by mouth daily VICODIN 5-500 MG  TABS (HYDROCODONE-ACETAMINOPHEN) One tab every 6 hours as needed VITAMIN D 1000 UNIT  CAPS (CHOLECALCIFEROL) two times a day BENICAR 40 MG  TABS (OLMESARTAN MEDOXOMIL) 1 by mouth once daily PREMARIN 0.625 MG TABS (ESTROGENS CONJUGATED) take one tab by mouth once daily SLOW RELEASE IRON 47.5 MG CR-TABS (FERROUS SULFATE) once daily MULTIPLE VITAMIN  TABS (MULTIPLE VITAMIN) once daily TUSSIONEX PENNKINETIC ER 8-10 MG/5ML LQCR (CHLORPHENIRAMINE-HYDROCODONE) as needed MACRODANTIN 100 MG CAPS (NITROFURANTOIN MACROCRYSTAL) Take 1 capsule by mouth at bedtime   Current Allergies (reviewed today): ! METHOTREXATE      Physical Exam  General:     Well-developed,well-nourished,in no acute distress; alert,appropriate and cooperative throughout examination Head:     Normocephalic and atraumatic without obvious abnormalities. No apparent alopecia or balding. Ears:     External ear exam shows no significant lesions or deformities.  Otoscopic examination reveals clear canals, tympanic  membranes are intact bilaterally without bulging, retraction, inflammation or discharge. Hearing is grossly normal bilaterally. Mouth:     Oral mucosa and oropharynx without lesions or exudates.  Teeth in good repair. Neck:     No deformities, masses, or tenderness noted. Lungs:     Normal respiratory effort,  chest expands symmetrically. Lungs are clear to auscultation, no crackles or wheezes. Heart:     Normal rate and regular rhythm. S1 and S2 normal without gallop, murmur, click, rub or other extra sounds.    Impression & Recommendations:  Problem # 1:  LEUKOCYTOSIS (ICD-288.60) asymptomatic on steroids---doubt the only cause will check labs Orders: T-Culture, Urine ([id number]) TLB-CBC Platelet - w/Differential (85025-CBCD)   Problem # 2:  MYALGIA (ICD-729.1)  Her updated medication list for this problem includes:    Vicodin 5-500 Mg Tabs (Hydrocodone-acetaminophen) ..... One tab every 6 hours as needed  Orders: Venipuncture (44010) Sedimentation Rate, non-automated (27253) TLB-CK-MB (Creatine Kinase MB) (82553-CKMB)   Complete Medication List: 1)  Proair Hfa 108 (90 Base) Mcg/act Aers (Albuterol sulfate) .... 2 puffs three times a day as needed 2)  Atenolol-chlorthalidone 50-25 Mg Tabs (Atenolol-chlorthalidone) .... 1/1/2 daily 3)  Budeprion Xl 300 Mg Tb24 (Bupropion hcl) .... Take 1 tablet by mouth once a day 4)  Effexor Xr 75 Mg Cp24 (Venlafaxine hcl) .... Take 1 capsule by mouth once a day 5)  Lorazepam 1 Mg Tabs (Lorazepam) .... Prn 6)  Novolog Flexpen 100 Unit/ml Soln (Insulin aspart) .... Three times a day as directed 7)  Onetouch Ultra Test Strp (Glucose blood) .... Tid 8)  Potassium Chloride Crys Cr 20 Meq Tbcr (Potassium chloride crys cr) .... Take 1 tablet by mouth two times a day 9)  Prednisone 10 Mg Tabs (Prednisone) .... Take 1 tablet by mouth once a day 10)  Zegerid 40-1100 Mg Caps (Omeprazole-sodium bicarbonate) .... 2 by mouth daily 11)  Crestor 20 Mg Tabs (Rosuvastatin calcium) .[name].. 1 by mouth once daily 12)  Qvar 80 Mcg/act Aers (Beclomethasone dipropionate) .... Two puffs twice daily 13)  Pen Needles 31g X 8 Mm Misc (Insulin pen needle) .... Use as directed 14)  Remicade 100 Mg Solr (Infliximab) .... Injection every 6 weeks 15)  Fluticasone Propionate 50  Mcg/act Susp (Fluticasone propionate) .... 2 sprays each nostril once daily 16)  Furosemide 20 Mg Tabs (Furosemide) .... Take one (1) by mouth daily 17)  Vicodin 5-500 Mg Tabs (Hydrocodone-acetaminophen) .... One tab every 6 hours as needed 18)  Vitamin D 1000 Unit Caps (Cholecalciferol) .... Two times a day 19)  Benicar 40 Mg Tabs (Olmesartan medoxomil) .[name].. 1 by mouth once daily 20)  Premarin 0.625 Mg Tabs (Estrogens conjugated) .... Take one tab by mouth once daily 21)  Slow Release Iron 47.5 Mg Cr-tabs (Ferrous sulfate) .... Once daily 22)  Multiple Vitamin Tabs (Multiple vitamin) .... Once daily 23)  Tussionex Pennkinetic Er 8-10 Mg/12ml Lqcr (Chlorpheniramine-hydrocodone) .... As needed 24)  Macrodantin 100 Mg Caps (Nitrofurantoin macrocrystal) .... Take 1 capsule by mouth at bedtime  Other Orders: UA Dipstick w/o Micro (automated)  [phone])    ] Laboratory Results   Urine Tests   Date/Time Reported: July 04, 2008 1:51 PM   Routine Urinalysis   Color: yellow Appearance: Clear Glucose: negative   (Normal Range: Negative) Bilirubin: negative   (Normal Range: Negative) Ketone: negative   (Normal Range: Negative) Spec. Gravity: 1.010   (Normal Range: 1.003-1.035) Blood: negative   (Normal Range: Negative) pH: 6.0   (Normal Range: 5.0-8.0)  Protein: negative   (Normal Range: Negative) Urobilinogen: 0.2   (Normal Range: 0-1) Nitrite: negative   (Normal Range: Negative) Leukocyte Esterace: trace   (Normal Range: Negative)    Comments: Wynona Canes, CMA  July 04, 2008 1:52 PM   Blood Tests    Date/Time Reported: July 04, 2008 12:02 PM   SED rate: 40  Comments: Wynona Canes, CMA  July 04, 2008 12:02 PM

## 2010-11-12 NOTE — Consult Note (Signed)
[facility]: [facility]   Imported By: Kassie Mends 02/16/2008 13:03:06  _____________________________________________________________________  External Attachment:    Type:   Image     Comment:   [facility]

## 2010-11-12 NOTE — Assessment & Plan Note (Signed)
Summary: 2 wk rov/njr/resch from bump list/jls   Vital Signs:  Patient Profile:   50 Years Old Female Height:     66 inches Weight:      287 pounds Temp:     98.1 degrees F Pulse rate:   76 / minute BP sitting:   136 / 88  (left arm) Cuff size:   large  Vitals Entered By: Gladis Riffle, RN (Feb 28, 2008 11:39 AM)                 Referred by:  Chase Picket PCP:  Dr. Birdie Sons  Chief Complaint:  2 week rov, states cough better--c/o right flank pain, not emptying bladder, frequency, and denies burning.  History of Present Illness: edema---significantly better on furosemide  in addition has urinary complaints---r flank discomfort, incomplete emptying of bladder and frequency.   Past Medical History: Anemia-NOS Diabetes mellitus, type II GERD Hyperlipidemia Hypertension pulmonary nodule OSA Vocal Cord dysfunction with recurrent cough Asthma vs Vocal cord dysfundtion SARCOID  Current Meds:  PROAIR HFA 108 (90 BASE) MCG/ACT  AERS (ALBUTEROL SULFATE) 2 puffs three times a day as needed ATENOLOL-CHLORTHALIDONE 50-25 MG TABS (ATENOLOL-CHLORTHALIDONE) 1/1/2 daily BUDEPRION XL 300 MG TB24 (BUPROPION HCL) Take 1 tablet by mouth once a day EFFEXOR XR 75 MG CP24 (VENLAFAXINE HCL) Take 1 capsule by mouth once a day LORAZEPAM 1 MG TABS (LORAZEPAM) prn NOVOLOG FLEXPEN 100 UNIT/ML SOLN (INSULIN ASPART) three times a day as directed ONETOUCH ULTRA TEST  STRP (GLUCOSE BLOOD) tid POTASSIUM CHLORIDE CRYS CR 20 MEQ TBCR (POTASSIUM CHLORIDE CRYS CR) Take 1 tablet by mouth once a day PREDNISONE 10 MG  TABS (PREDNISONE) 1 once daily REGLAN 10 MG TABS (METOCLOPRAMIDE HCL) Take 1 tablet by mouth twice a day ZEGERID 40-1100 MG CAPS (OMEPRAZOLE-SODIUM BICARBONATE) 2 by mouth daily CRESTOR 20 MG TABS (ROSUVASTATIN CALCIUM) 1 by mouth once daily QVAR 80 MCG/ACT  AERS (BECLOMETHASONE DIPROPIONATE) two puffs twice daily PEN NEEDLES 31G X 8 MM  MISC (INSULIN PEN NEEDLE) USE AS DIRECTED NEED F/U  APPT B-4 ADDTIONAL REFILLS PREMARIN 0.3 MG TABS (ESTROGENS CONJUGATED) once daily REMICADE 100 MG  SOLR (INFLIXIMAB) Injection every 6 weeks FLUTICASONE PROPIONATE 50 MCG/ACT  SUSP (FLUTICASONE PROPIONATE) 2 sprays each nostril once daily   Past Surgical History: Ventral Hernia Repair Nissen Fundoplication Cholecystectomy Hysterectomy  Social History: Married Never Smoked Regular exercise-no Alcohol use-no  Family History: father-alive MI, DM mother alive and well uncle with SARCOID Current Meds:  PROAIR HFA 108 (90 BASE) MCG/ACT  AERS (ALBUTEROL SULFATE) 2 puffs three times a day as needed ATENOLOL-CHLORTHALIDONE 50-25 MG TABS (ATENOLOL-CHLORTHALIDONE) 1/1/2 daily BUDEPRION XL 300 MG TB24 (BUPROPION HCL) Take 1 tablet by mouth once a day EFFEXOR XR 75 MG CP24 (VENLAFAXINE HCL) Take 1 capsule by mouth once a day LORAZEPAM 1 MG TABS (LORAZEPAM) prn NOVOLOG FLEXPEN 100 UNIT/ML SOLN (INSULIN ASPART) three times a day as directed ONETOUCH ULTRA TEST  STRP (GLUCOSE BLOOD) tid POTASSIUM CHLORIDE CRYS CR 20 MEQ TBCR (POTASSIUM CHLORIDE CRYS CR) Take 1 tablet by mouth two times a day PREDNISONE 10 MG  TABS (PREDNISONE) 1 1/2 once daily REGLAN 10 MG TABS (METOCLOPRAMIDE HCL) Take 1 tablet by mouth twice a day ZEGERID 40-1100 MG CAPS (OMEPRAZOLE-SODIUM BICARBONATE) 2 by mouth daily CRESTOR 20 MG TABS (ROSUVASTATIN CALCIUM) 1 by mouth once daily QVAR 80 MCG/ACT  AERS (BECLOMETHASONE DIPROPIONATE) two puffs twice daily PEN NEEDLES 31G X 8 MM  MISC (INSULIN PEN NEEDLE) USE AS DIRECTED NEED F/U APPT  B-4 ADDTIONAL REFILLS PREMARIN 0.3 MG TABS (ESTROGENS CONJUGATED) once daily REMICADE 100 MG  SOLR (INFLIXIMAB) Injection every 6 weeks FLUTICASONE PROPIONATE 50 MCG/ACT  SUSP (FLUTICASONE PROPIONATE) 2 sprays each nostril once daily TUSSIONEX PENNKINETIC ER 8-10 MG/5ML LQCR (CHLORPHENIRAMINE-HYDROCODONE) 1 tsp two times a day as needed FUROSEMIDE 20 MG TABS (FUROSEMIDE) Take one (1) by mouth  daily CIPRO 500 MG TABS (CIPROFLOXACIN HCL) 1 by mouth 2 times daily      Current Allergies (reviewed today): ! METHOTREXATE     Review of Systems       see hpi no other complaints in a complete ROS    Physical Exam  General:     Well-developed,well-nourished,in no acute distress; alert,appropriate and cooperative throughout examination Head:     normocephalic and atraumatic.   Eyes:     pupils equal and pupils round.   Neck:     No deformities, masses, or tenderness noted. Chest Wall:     No deformities, masses, or tenderness noted. Abdomen:     pain or suprapubic tenderness.    Impression & Recommendations:  Problem # 1:  EDEMA (ICD-782.3)  Her updated medication list for this problem includes:    Atenolol-chlorthalidone 50-25 Mg Tabs (Atenolol-chlorthalidone) .[name]... 1/1/2 daily    Furosemide 20 Mg Tabs (Furosemide) .[name]... Take one (1) by mouth daily  Orders: Venipuncture (60454) TLB-BMP (Basic Metabolic Panel-BMET) (80048-METABOL)   Problem # 2:  UTI (ICD-599.0)  Her updated medication list for this problem includes:    Cipro 500 Mg Tabs (Ciprofloxacin hcl) .[name]... 1 by mouth 2 times daily  Orders: UA Dipstick w/o Micro (automated)  (81003) T-Culture, Urine ([id number]) T-Urine Culture, Bacteria (29562) TLB-BMP (Basic Metabolic Panel-BMET) (80048-METABOL)   Complete Medication List: 1)  Proair Hfa 108 (90 Base) Mcg/act Aers (Albuterol sulfate) .... 2 puffs three times a day as needed 2)  Atenolol-chlorthalidone 50-25 Mg Tabs (Atenolol-chlorthalidone) .... 1/1/2 daily 3)  Budeprion Xl 300 Mg Tb24 (Bupropion hcl) .... Take 1 tablet by mouth once a day 4)  Effexor Xr 75 Mg Cp24 (Venlafaxine hcl) .... Take 1 capsule by mouth once a day 5)  Lorazepam 1 Mg Tabs (Lorazepam) .... Prn 6)  Novolog Flexpen 100 Unit/ml Soln (Insulin aspart) .... Three times a day as directed 7)  Onetouch Ultra Test Strp (Glucose blood) .... Tid 8)  Potassium Chloride Crys Cr 20  Meq Tbcr (Potassium chloride crys cr) .... Take 1 tablet by mouth two times a day 9)  Prednisone 10 Mg Tabs (Prednisone) .[name].. 1 1/2 once daily 10)  Reglan 10 Mg Tabs (Metoclopramide hcl) .... Take 1 tablet by mouth twice a day 11)  Zegerid 40-1100 Mg Caps (Omeprazole-sodium bicarbonate) .... 2 by mouth daily 12)  Crestor 20 Mg Tabs (Rosuvastatin calcium) .[name].. 1 by mouth once daily 13)  Qvar 80 Mcg/act Aers (Beclomethasone dipropionate) .... Two puffs twice daily 14)  Pen Needles 31g X 8 Mm Misc (Insulin pen needle) .... Use as directed need f/u appt b-4 addtional refills 15)  Premarin 0.3 Mg Tabs (Estrogens conjugated) .... Once daily 16)  Remicade 100 Mg Solr (Infliximab) .... Injection every 6 weeks 17)  Fluticasone Propionate 50 Mcg/act Susp (Fluticasone propionate) .... 2 sprays each nostril once daily 18)  Tussionex Pennkinetic Er 8-10 Mg/106ml Lqcr (Chlorpheniramine-hydrocodone) .[name].. 1 tsp two times a day as needed 19)  Furosemide 20 Mg Tabs (Furosemide) .... Take one (1) by mouth daily 20)  Cipro 500 Mg Tabs (Ciprofloxacin hcl) .[name].. 1 by mouth 2 times daily  Prescriptions: CIPRO 500 MG TABS (CIPROFLOXACIN HCL) 1 by mouth 2 times daily  #6 x 0   Entered and Authorized by:   Birdie Sons MD   Signed by:   Birdie Sons MD on 03/01/2008   Method used:   Electronically sent to ...       [facility] [address]       [address]       [place], [place]  [address]       Ph: [phone]       Fax: [phone]   RxID:   [id number]  ] Laboratory Results   Urine Tests   Date/Time Reported: Feb 28, 2008 11:56 AM   Routine Urinalysis   Color: yellow Appearance: Clear Glucose: negative   (Normal Range: Negative) Bilirubin: negative   (Normal Range: Negative) Ketone: negative   (Normal Range: Negative) Spec. Gravity: 1.010   (Normal Range: 1.003-1.035) Blood: negative   (Normal Range: Negative) pH: 5.0   (Normal Range: 5.0-8.0) Protein: negative    (Normal Range: Negative) Urobilinogen: 0.2   (Normal Range: 0-1) Nitrite: negative   (Normal Range: Negative) Leukocyte Esterace: 1+   (Normal Range: Negative)

## 2010-11-12 NOTE — Miscellaneous (Signed)
Summary: Orders Update  Clinical Lists Changes  Orders: Added new Service order of Est. Patient Level III (99213) - Signed 

## 2010-11-12 NOTE — Letter (Signed)
Summary: [facility]   Imported By: Maryln Gottron 08/21/2010 15:11:12  _____________________________________________________________________  External Attachment:    Type:   Image     Comment:   External Document

## 2010-11-12 NOTE — Assessment & Plan Note (Signed)
Summary: 1 month rov/njr--? discuss elevated white count? was to be 2 ...   Vital Signs:  Patient profile:   50 year old female Weight:      232 pounds Temp:     98.1 degrees F oral Pulse rate:   64 / minute Pulse rhythm:   regular Resp:     16 per minute BP sitting:   116 / 70  (left arm) Cuff size:   large  Vitals Entered By: Gladis Riffle, RN (May 21, 2010 8:53 AM) CC: ROV Is Patient Diabetic? Yes Did you bring your meter with you today? No   Primary Care Provider:  Birdie Sons, MD   CC:  ROV.  History of Present Illness:  Follow-Up Visit      [name] who presents for Follow-up visit.  The patient denies chest pain and palpitations.  Since the last visit the patient notes no new problems or concerns.  The patient reports taking meds as prescribed.  When questioned about possible medication side effects, the patient notes none.    All other systems reviewed and were negative   Preventive Screening-Counseling & Management  Alcohol-Tobacco     Smoking Status: never  Current Problems (verified): 1)  Anemia-unspecified  (ICD-285.9) 2)  Obesity  (ICD-278.00) 3)  Sleep Apnea  (ICD-780.57) 4)  Menopausal Syndrome  (ICD-627.2) 5)  Sarcoidosis  (ICD-135) 6)  Hypertension  (ICD-401.9) 7)  Hyperlipidemia  (ICD-272.4) 8)  Gerd  (ICD-530.81) 9)  Diabetes Mellitus, Type II  (ICD-250.00)  Current Medications (verified): 1)  Atenolol-Chlorthalidone 50-25 Mg Tabs (Atenolol-Chlorthalidone) .... Take 1 Tablet By Mouth Once A Day 2)  Effexor Xr 75 Mg Cp24 (Venlafaxine Hcl) .... Take 1 Tablet By Mouth Once A Day 3)  Lorazepam 1 Mg Tabs (Lorazepam) .... One Every 6 Hrs As Needed 4)  Remicade 100 Mg  Solr (Infliximab) .... Injection Every 6 Weeks 5)  Omnaris 50 Mcg/act Susp (Ciclesonide) .... One Spray Each Nostril Once Daily 6)  Estrace 1 Mg Tabs (Estradiol) .... Take 1 Tablet By Mouth Once A Day 7)  Restasis 0.05 % Emul (Cyclosporine) .[name].. 1 Drop Each Eye  Daily 8)  Lotemax 0.5 % Susp (Loteprednol Etabonate) .[name].. 1 Drop Each Eye Every 6 Hours As Needed 9)  Simvastatin 40 Mg Tabs (Simvastatin) .... Take One Tablet At Bedtime Start One Week After Completing Diflucan 10)  Levbid 0.375 Mg Xr12h-Tab (Hyoscyamine Sulfate) .[name].. 1 By Mouth Two Times A Day As Needed 11)  Prednisone 5 Mg Tabs (Prednisone) .... Take 1 Tablet By Mouth Every Other Day 12)  Cpap 7 Cm 13)  Nystatin-Triamcinolone 100000-0.1 Unit/gm-% Crea (Nystatin-Triamcinolone) .... Apply Two Times A Day To Affected Area 14)  Hydromet 5-1.5 Mg/56ml Syrp (Hydrocodone-Homatropine) .[name].. 1 Tsp Three Times A Day As Needed Cough 15)  Valtrex 1 Gm Tabs (Valacyclovir Hcl) .... Take 1 Tablet By Mouth Three Times A Day 16)  Furosemide 40 Mg Tabs (Furosemide) .... Qd  Allergies: 1)  ! Methotrexate  Past History:  Past Medical History:  SARCOIDOSIS (ICD-135)   -CT Chest 6/08 improvement in Mediastinal LAN   -11/08 start of remecade Rx per [facility] PULMONARY NODULE (ICD-518.89)    -CT Chest 6/08 reduction in size of nodules due to Arava and Pred RX     -probable granulomas due to Sarcoidosis HYPERTENSION (ICD-401.9) HYPERLIPIDEMIA (ICD-272.4) GERD (ICD-530.81) Sleep APnea DIABETES MELLITUS, TYPE II (ICD-250.00) ANEMIA-NOS (ICD-285.9)  Fundic Gland Polyp  Past Surgical History: Ventral Hernia  Repair Nissen Fundoplication Cholecystectomy Hysterectomy right knee arthroscopy May 11, 2010  Family History: Reviewed history from 10/20/2009 and no changes required. father-alive MI, DM mother alive and well uncle with SARCOID No FH of Colon Cancer: Family History Emphysema-Mother, Father  Family History Asthma-Asthma Family History COPD -mother  Social History: Reviewed history from 10/20/2009 and no changes required. Married, live with husband Never Smoked Regular exercise-no Alcohol use-no Illicit Drug Use - no  Physical Exam  General:  alert and well-developed.    Head:  normocephalic and atraumatic.   Eyes:  pupils equal and pupils round.   Ears:  R ear normal and L ear normal.   Neck:  supple.   Lungs:  normal respiratory effort, no intercostal retractions, no dullness, and no fremitus.   Heart:  normal rate and regular rhythm.   Abdomen:  soft and non-tender.   Msk:  No deformity or scoliosis noted of thoracic or lumbar spine.   Neurologic:  cranial nerves II-XII intact and gait normal.   Skin:  turgor normal and color normal.     Impression & Recommendations:  Problem # 1:  SARCOIDOSIS (ICD-135) symptomaticllay doing well low dose steroids  NOTE chronic leukocytosis---suspect related to sarcoid---will continue to follow  Problem # 2:  HYPERTENSION (ICD-401.9) controlled continue current medications  note hypokalemia resume potassium as she has resumed lasix Her updated medication list for this problem includes:    Atenolol-chlorthalidone 50-25 Mg Tabs (Atenolol-chlorthalidone) .[name]... Take 1 tablet by mouth once a day    Furosemide 40 Mg Tabs (Furosemide) ..... Qd  BP today: 116/70 Prior BP: 122/82 (04/23/2010)  Prior 10 Yr Risk Heart Disease: 8 % (09/21/2006)  Labs Reviewed: K+: 4.1 (04/15/2010) Creat: : 0.6 (04/15/2010)   Chol: 190 (04/15/2010)   HDL: 41.70 (04/15/2010)   LDL: DEL (09/30/2008)   TG: 333.0 (04/15/2010)  Problem # 3:  DIABETES MELLITUS, TYPE II (ICD-250.00)  encouraged even more weight loss she has done a GREAT job  Labs Reviewed: Creat: 0.6 (04/15/2010)     Last Eye Exam: normalpt's report (10/11/2009) Reviewed HgBA1c results: 6.8 (04/15/2010)  6.6 (12/02/2009)  Problem # 4:  HYPERLIPIDEMIA (ICD-272.4)  Her updated medication list for this problem includes:    Simvastatin 40 Mg Tabs (Simvastatin) .[name]... Take one tablet at bedtime start one week after completing diflucan  Labs Reviewed: SGOT: 17 (04/15/2010)   SGPT: 11 (04/15/2010)  Prior 10 Yr Risk Heart Disease: 8 % (09/21/2006)   HDL:41.70  (04/15/2010), 44.10 (12/02/2009)  LDL:DEL (09/30/2008), DEL (06/05/2008)  Chol:190 (04/15/2010), 171 (12/02/2009)  Trig:333.0 (04/15/2010), 314.0 (12/02/2009)  Complete Medication List: 1)  Atenolol-chlorthalidone 50-25 Mg Tabs (Atenolol-chlorthalidone) .... Take 1 tablet by mouth once a day 2)  Effexor Xr 75 Mg Cp24 (Venlafaxine hcl) .... Take 1 tablet by mouth once a day 3)  Lorazepam 1 Mg Tabs (Lorazepam) .... One every 6 hrs as needed 4)  Remicade 100 Mg Solr (Infliximab) .... Injection every 6 weeks 5)  Omnaris 50 Mcg/act Susp (Ciclesonide) .... One spray each nostril once daily 6)  Estrace 1 Mg Tabs (Estradiol) .... Take 1 tablet by mouth once a day 7)  Restasis 0.05 % Emul (Cyclosporine) .[name].. 1 drop each eye daily 8)  Lotemax 0.5 % Susp (Loteprednol etabonate) .[name].. 1 drop each eye every 6 hours as needed 9)  Simvastatin 40 Mg Tabs (Simvastatin) .... Take one tablet at bedtime start one week after completing diflucan 10)  Levbid 0.375 Mg Xr12h-tab (Hyoscyamine sulfate) .[name].. 1 by mouth two times a  day as needed 11)  Prednisone 5 Mg Tabs (Prednisone) .... Take 1 tablet by mouth every other day 12)  Cpap 7 Cm  13)  Nystatin-triamcinolone 100000-0.1 Unit/gm-% Crea (Nystatin-triamcinolone) .... Apply two times a day to affected area 14)  Hydromet 5-1.5 Mg/72ml Syrp (Hydrocodone-homatropine) .[name].. 1 tsp three times a day as needed cough 15)  Valtrex 1 Gm Tabs (Valacyclovir hcl) .... Take 1 tablet by mouth three times a day 16)  Furosemide 40 Mg Tabs (Furosemide) .... Qd 17)  Klor-con M20 20 Meq Cr-tabs (Potassium chloride crys cr) .... Take 1 tablet by mouth once a day  Patient Instructions: 1)  see me 2 months 2)  labs one week prior to visit 3)  lipids---272.4 4)  lfts-995.2 5)  bmet-995.2 6)  A1C-250.02 7)    8)  cbc -285.9  Appended Document: 1 month rov/njr--? discuss elevated white count? was to be 2 ...   Immunizations Administered:  Tetanus Vaccine:    Vaccine Type: Tdap     Site: left deltoid    Mfr: GlaxoSmithKline    Dose: 0.5 ml    Route: IM    Given by: Gladis Riffle, RN    Exp. Date: 04/09/2012    Lot #: [id number]

## 2010-11-12 NOTE — Progress Notes (Signed)
Summary: Needs increase in Potassium  Phone Note Call from Patient   Summary of Call: Patient needs a refill and an increase in her potassium script. Patient states her dose was increased to three tablets a day. [facility] [employer]. Patient can be reached at [phone]. Initial call taken by: Darra Lis RMA,  April 24, 2009 10:41 AM  Follow-up for Phone Call        ok Follow-up by: Birdie Sons MD,  April 24, 2009 11:13 AM  Additional Follow-up for Phone Call Additional follow up Details #1::        Patient informed. Additional Follow-up by: Darra Lis RMA,  April 24, 2009 11:20 AM    New/Updated Medications: POTASSIUM CHLORIDE CRYS CR 20 MEQ TBCR (POTASSIUM CHLORIDE CRYS CR) 1 three times a day Prescriptions: POTASSIUM CHLORIDE CRYS CR 20 MEQ TBCR (POTASSIUM CHLORIDE CRYS CR) 1 three times a day  #100 x 3   Entered by:   Darra Lis RMA   Authorized by:   Birdie Sons MD   Signed by:   Darra Lis RMA on 04/24/2009   Method used:   Electronically to        [facility] (retail)       [address]       [place], [place]  [address]       Ph: [phone]       Fax: [phone]   RxID:   [id number]

## 2010-11-12 NOTE — Miscellaneous (Signed)
Summary: [facility] Previsit/prep  Clinical Lists Changes  Medications: Added new medication of MOVIPREP 100 GM  SOLR (PEG-KCL-NACL-NASULF-NA ASC-C) As per prep instructions. - Signed Rx of MOVIPREP 100 GM  SOLR (PEG-KCL-NACL-NASULF-NA ASC-C) As per prep instructions.;  #1 x 0;  Signed;  Entered by: Wyona Almas RN;  Authorized by: Hilarie Fredrickson MD;  Method used: Electronically to [facility] [address], [place], [place], [place]  [address], Ph: [phone], Fax: [phone] Observations: Added new observation of ALLERGY REV: Done (04/20/2010 8:27)    Prescriptions: MOVIPREP 100 GM  SOLR (PEG-KCL-NACL-NASULF-NA ASC-C) As per prep instructions.  #1 x 0   Entered by:   Wyona Almas RN   Authorized by:   Hilarie Fredrickson MD   Signed by:   Wyona Almas RN on 04/20/2010   Method used:   Electronically to        [facility] (retail)       [address]       [place], [place]  [address]       Ph: [phone]       Fax: [phone]   RxID:   [id number]

## 2010-11-12 NOTE — Letter (Signed)
Summary: [facility]   Imported By: Esmeralda Links D'jimraou 09/26/2008 14:14:20  _____________________________________________________________________  External Attachment:    Type:   Image     Comment:   External Document

## 2010-11-12 NOTE — Progress Notes (Signed)
Summary: Rx request  Phone Note Call from Patient Call back at [phone]   Call For: SWORDS Summary of Call: PT HAS FEVER BLISTERS ON HER MOUTH  WOULD LIKE FOR SOMEHING TO BE CALLED IN PHARMACY [facility]  Initial call taken by: Shan Levans,  June 29, 2007 11:59 AM  Follow-up for Phone Call        acyclovir cream 5 x's daily for 5 days. 5 grams/0refills Follow-up by: Birdie Sons MD,  June 29, 2007 9:53 PM  Additional Follow-up for Phone Call Additional follow up Details #1::        Rx called, in Pt informed Additional Follow-up by: Sid Falcon LPN,  June 30, 2007 9:12 AM

## 2010-11-12 NOTE — Medication Information (Signed)
Summary: Order for Diabetic Testing Supplies  Order for Diabetic Testing Supplies   Imported By: Maryln Gottron 01/08/2010 11:10:53  _____________________________________________________________________  External Attachment:    Type:   Image     Comment:   External Document

## 2010-11-12 NOTE — Medication Information (Signed)
Summary: Tax adviser   Imported By: Valinda Hoar 10/29/2010 10:57:14  _____________________________________________________________________  External Attachment:    Type:   Image     Comment:   External Document

## 2010-11-12 NOTE — Procedures (Signed)
Summary: Colonoscopy   Colonoscopy  Procedure date:  02/19/2005  Findings:      Location:  [facility].  Results: Normal.   Procedures Next Due Date:    Colonoscopy: 02/2010 Patient Name: [name], [name] MRN:  Procedure Procedures: Colonoscopy CPT: 95284.  Personnel: Endoscopist: Wilhemina Bonito. Marina Goodell, MD.  Referred By: Valetta Mole Swords, MD.  Exam Location: Exam performed in Outpatient Clinic. Outpatient  Patient Consent: Procedure, Alternatives, Risks and Benefits discussed, consent obtained, from patient. Consent was obtained by the RN.  Indications Symptoms: Hematochezia.  History  Current Medications: Patient is not currently taking Coumadin.  Pre-Exam Physical: Performed Feb 19, 2005. Entire physical exam was normal. Abnormal PE findings include: Obese.  Exam Exam: Extent of exam reached: Cecum, extent intended: Cecum.  The cecum was identified by appendiceal orifice and IC valve. Patient position: on left side. Colon retroflexion performed. Images taken. ASA Classification: II. Tolerance: excellent.  Monitoring: Pulse and BP monitoring, Oximetry used. Supplemental O2 given.  Colon Prep Used Miralax for colon prep. Prep results: excellent.  Sedation Meds: Patient assessed and found to be appropriate for moderate (conscious) sedation. Fentanyl 100 mcg. given IV. Versed 10 mg. given IV.  Findings NORMAL EXAM: Cecum to Rectum.   Assessment Normal examination.  Comments: SUSPECT BLEEDING DUE TO FISSURE (NOW RESOLVED) Events  Unplanned Interventions: No intervention was required.  Unplanned Events: There were no complications. Plans Disposition: After procedure patient sent to recovery. After recovery patient sent home.  Scheduling/Referral: Colonoscopy, to Wilhemina Bonito. Marina Goodell, MD, IN 5 YEARS,    This report was created from the original endoscopy report, which was reviewed and signed by the above listed endoscopist.   cc: Birdie Sons, MD   The Patient

## 2010-11-12 NOTE — Progress Notes (Signed)
Summary: Pt req lab results. Pls call today.  Phone Note Call from Patient Call back at [phone] cell   Caller: Patient Summary of Call: Pt called and is req lab results. Pt leaving to go out of town for the weekend. Pls call today.    Initial call taken by: Lucy Antigua,  June 05, 2010 10:33 AM  Follow-up for Phone Call        Dr. Cato Mulligan is going to talk to Dr. Delford Field about wbc, otherwise labs are ok. Follow-up by: Lynann Beaver CMA,  June 05, 2010 11:45 AM  Additional Follow-up for Phone Call Additional follow up Details #1::        Pt notified. Additional Follow-up by: Lynann Beaver CMA,  June 05, 2010 2:09 PM

## 2010-11-12 NOTE — Progress Notes (Signed)
Summary: oral yeast  Phone Note Call from Patient   Caller: husband,wm,[phone] Call For: swords Summary of Call: Using the inhaler & prednisone for her asthma has caused yeast infection mouth.  Requesting the swish & swallow med used before.  Different [facility] today.   Initial call taken by: Rudy Jew, RN,  August 11, 2009 9:50 AM  Follow-up for Phone Call        see rx Follow-up by: Birdie Sons MD,  August 11, 2009 1:33 PM  Additional Follow-up for Phone Call Additional follow up Details #1::        Mr. Economou notified.   Additional Follow-up by: Rudy Jew, RN,  August 11, 2009 4:49 PM    New/Updated Medications: CLOTRIMAZOLE 10 MG LOZG (CLOTRIMAZOLE) dissolve in mouth four times a day for 7 days Prescriptions: CLOTRIMAZOLE 10 MG LOZG (CLOTRIMAZOLE) dissolve in mouth four times a day for 7 days  #28 x 0   Entered and Authorized by:   Birdie Sons MD   Signed by:   Birdie Sons MD on 08/11/2009   Method used:   Electronically to        [facility] (retail)       [address]       [place], [place]  [address]       Ph: [phone]       Fax: [phone]   RxID:   [id number]

## 2010-11-12 NOTE — Assessment & Plan Note (Signed)
Summary: post hos fup/njr   Vital Signs:  Patient profile:   50 year old female Weight:      269 pounds BMI:     43.57 Temp:     98.1 degrees F oral Pulse rate:   78 / minute Pulse rhythm:   regular BP sitting:   128 / 78  (left arm) Cuff size:   large  Vitals Entered By: [name] Ache, RN (May 08, 2009 8:38 AM)  Primary Care Provider:  Dr. Birdie [name]  CC:  Hosp f/u and saw Dr [name] yesterday. BS- 134.[name]  History of Present Illness:  Follow-Up Visit      [name] who presents for Follow-up visit.  The patient denies chest pain, palpitations, dizziness, syncope, edema, SOB, DOE, PND, and orthopnea.  Since the last visit the patient notes a recent hospitalization secondary to bronchospasm---Records and IMaging reviewed.  The patient reports taking meds as prescribed, not monitoring BP, and dietary noncompliance.  When questioned about possible medication side effects, the patient notes none.   Reviewed Dr. Lynelle [name] note from yesterday  All other systems reviewed and were negative   Current Problems (verified): 1)  Candidiasis  (ICD-112.9) 2)  Myalgia  (ICD-729.1) 3)  Sleep Apnea  (ICD-780.57) 4)  Nonspecific Abnormal Toxicological Findings  (ICD-796.0) 5)  Headache  (ICD-784.0) 6)  Menopausal Syndrome  (ICD-627.2) 7)  Sarcoidosis  (ICD-135) 8)  Hypertension  (ICD-401.9) 9)  Hyperlipidemia  (ICD-272.4) 10)  Gerd  (ICD-530.81) 11)  Diabetes Mellitus, Type II  (ICD-250.00)  Current Medications (verified): 1)  Proair Hfa 108 (90 Base) Mcg/act  Aers (Albuterol Sulfate) .... 2 Puffs Three Times A Day As Needed 2)  Atenolol-Chlorthalidone 50-25 Mg Tabs (Atenolol-Chlorthalidone) .... Take 1 Tablet By Mouth Once A Day 3)  Budeprion Xl 300 Mg Tb24 (Bupropion Hcl) .... Take 1 Tablet By Mouth Once A Day 4)  Effexor Xr 75 Mg Cp24 (Venlafaxine Hcl) .... Take 1 Capsule By Mouth Two Times A Day 5)  Lorazepam 1 Mg Tabs (Lorazepam) .... Prn 6)  Novolog Flexpen 100  Unit/ml Soln (Insulin Aspart) .... Three Times A Day As Directed 7)  Onetouch Ultra Test  Strp (Glucose Blood) .... Tid 8)  Potassium Chloride Crys Cr 20 Meq Tbcr (Potassium Chloride Crys Cr) .[name].. 1 Three Times A Day 9)  Prednisone 20 Mg Tabs (Prednisone) .... One Half Daily 10)  Zegerid 40-1100 Mg Caps (Omeprazole-Sodium Bicarbonate) .... One Twice Daily 11)  Crestor 20 Mg Tabs (Rosuvastatin Calcium) .[name].. 1 By Mouth Once Daily 12)  Pen Needles 31g X 8 Mm  Misc (Insulin Pen Needle) .... Use As Directed 13)  Remicade 100 Mg  Solr (Infliximab) .... Injection Every 6 Weeks 14)  Fluticasone Propionate 50 Mcg/act  Susp (Fluticasone Propionate) .... 2 Sprays Each Nostril Once Daily 15)  Furosemide 20 Mg Tabs (Furosemide) .... Take One (1) By Mouth Daily 16)  Vicodin 5-500 Mg  Tabs (Hydrocodone-Acetaminophen) .... One Tab Every 6 Hours As Needed 17)  Vitamin D 1000 Unit  Caps (Cholecalciferol) .... Two Times A Day 18)  Estrace 1 Mg Tabs (Estradiol) .... Take 1 Tablet By Mouth Once A Day 19)  Slow Release Iron 47.5 Mg Cr-Tabs (Ferrous Sulfate) .... Once Daily 20)  Multiple Vitamin  Tabs (Multiple Vitamin) .... Once Daily 21)  Tussionex Pennkinetic Er 8-10 Mg/30ml Lqcr (Chlorpheniramine-Hydrocodone) .... Take 1 Teaspoon Every 12 Hrs As Needed Cough 22)  Restasis 0.05 % Emul (Cyclosporine) .[name].. 1 Drop Each Eye  Daily 23)  Lotemax 0.5 % Susp (Loteprednol Etabonate) .[name].. 1 Drop Each Eye Every 6 Hours As Needed 24)  Utac 500-500 Mg Tabs (Methenamine Mand-Sod Phosphate) .... Two Times A Day 25)  Zantac 150 Mg Tabs (Ranitidine Hcl) .... One Daily 26)  Tessalon Perles 100 Mg  Caps (Benzonatate) .... One To Two By Mouth 3-4 Times Daily 27)  Diflucan 100 Mg Tabs (Fluconazole) .... Take Two By Mouth Times One Dose Then One Daily Until Gone  Allergies (verified): 1)  ! Methotrexate  Past History:  Past Medical History: Last updated: 04/21/2009 EDEMA (ICD-782.3) HEADACHE (ICD-784.0) MENOPAUSAL SYNDROME  (ICD-627.2) UTI (ICD-599.0) SARCOIDOSIS (ICD-135)   -CT Chest 6/08 improvement in Mediastinal LAN   -11/08 start of remecade Rx per [name]/[name] Rheum PULMONARY NODULE (ICD-518.89)    -CT Chest 6/08 reduction in size of nodules due to Arava and Pred RX     -probable granulomas due to Sarcoidosis HYPERTENSION (ICD-401.9) HYPERLIPIDEMIA (ICD-272.4) GERD (ICD-530.81) Sleep APnea DIABETES MELLITUS, TYPE II (ICD-250.00) ANEMIA-NOS (ICD-285.9)    Past Surgical History: Last updated: 08/21/2006 Ventral Hernia Repair Nissen Fundoplication Cholecystectomy Hysterectomy  Family History: Last updated: 09/15/2007 father-alive MI, DM mother alive and well uncle with SARCOID  Social History: Last updated: 05/31/2007 Married Never Smoked Regular exercise-no Alcohol use-no  Risk Factors: Exercise: no (05/31/2007)  Risk Factors: Smoking Status: never (04/08/2009)  Review of Systems       All other systems reviewed and were negative   Physical Exam  General:  nad, obese Head:  normocephalic and atraumatic.   Eyes:  pupils equal and pupils round.   Ears:  R ear normal and L ear normal.   Nose:  no external deformity and no external erythema.   Neck:  no masses, thyromegaly, or abnormal cervical nodes Chest Wall:  No deformities, masses, or tenderness noted. Lungs:  Normal respiratory effort, chest expands symmetrically. Lungs are clear to auscultation, no crackles or wheezes. Heart:  normal rate and no murmur.   Abdomen:  Bowel sounds positive,abdomen soft and non-tender without masses, organomegaly or hernias noted.  obese Msk:  No deformity or scoliosis noted of thoracic or lumbar spine.   no joint swelling or erythema Pulses:  R radial normal and L radial normal.   Neurologic:  cranial nerves II-XII intact and gait normal.   Skin:  turgor normal and color normal.   Psych:  memory intact for recent and remote and good eye contact.     Impression &  Recommendations:  Problem # 1:  GERD (ICD-530.81) could be related to bronchospasm agree with H2 blocker trial  consider ENDO with dr. Marina [name] Her updated medication list for this problem includes:    Zegerid 40-1100 Mg Caps (Omeprazole-sodium bicarbonate) ..... One twice daily    Zantac 150 Mg Tabs (Ranitidine hcl) ..... One daily  Problem # 2:  HYPERTENSION (ICD-401.9)  controlled continue current medications  Her updated medication list for this problem includes:    Atenolol-chlorthalidone 50-25 Mg Tabs (Atenolol-chlorthalidone) .[name]... Take 1 tablet by mouth once a day    Furosemide 20 Mg Tabs (Furosemide) .[name]... Take one (1) by mouth daily  BP today: 128/78 Prior BP: 128/82 (05/01/2009)  Prior 10 Yr Risk Heart Disease: 8 % (09/21/2006)  Labs Reviewed: K+: 3.3 (04/21/2009) Creat: : 0.7 (04/21/2009)   Chol: 158 (04/21/2009)   HDL: 39.90 (04/21/2009)   LDL: DEL (09/30/2008)   TG: 319.0 (04/21/2009)  Orders: Venipuncture (82956) TLB-BMP (Basic Metabolic Panel-BMET) (80048-METABOL)  Problem # 3:  DIABETES  MELLITUS, TYPE II (ICD-250.00)  controlled continue current medications  Her updated medication list for this problem includes:    Novolog Flexpen 100 Unit/ml Soln (Insulin aspart) .[name]... Three times a day as directed  Labs Reviewed: Creat: 0.7 (04/21/2009)     Last Eye Exam: normal-pt'[name] report (07/11/2008) Reviewed HgBA1c results: 6.5 (04/21/2009)  5.9 (12/24/2008)  Orders: TLB-A1C / Hgb A1C (Glycohemoglobin) (83036-A1C)  Problem # 4:  HYPERLIPIDEMIA (ICD-272.4)  controlled continue current medications  Her updated medication list for this problem includes:    Crestor 20 Mg Tabs (Rosuvastatin calcium) .[name]... 1 by mouth once daily  Labs Reviewed: SGOT: 19 (12/24/2008)   SGPT: 21 (04/21/2009)  Prior 10 Yr Risk Heart Disease: 8 % (09/21/2006)   HDL:39.90 (04/21/2009), 36.10 (12/24/2008)  LDL:DEL (09/30/2008), DEL (06/05/2008)  Chol:158 (04/21/2009), 150  (12/24/2008)  Trig:319.0 (04/21/2009), 352.0 (12/24/2008)  Orders: TLB-Hepatic/Liver Function Pnl (80076-HEPATIC) TLB-Lipid Panel (80061-LIPID)  Complete Medication List: 1)  Proair Hfa 108 (90 Base) Mcg/act Aers (Albuterol sulfate) .... 2 puffs three times a day as needed 2)  Atenolol-chlorthalidone 50-25 Mg Tabs (Atenolol-chlorthalidone) .... Take 1 tablet by mouth once a day 3)  Budeprion Xl 300 Mg Tb24 (Bupropion hcl) .... Take 1 tablet by mouth once a day 4)  Effexor Xr 75 Mg Cp24 (Venlafaxine hcl) .... Take 1 capsule by mouth two times a day 5)  Lorazepam 1 Mg Tabs (Lorazepam) .... Prn 6)  Novolog Flexpen 100 Unit/ml Soln (Insulin aspart) .... Three times a day as directed 7)  Onetouch Ultra Test Strp (Glucose blood) .... Tid 8)  Potassium Chloride Crys Cr 20 Meq Tbcr (Potassium chloride crys cr) .[name].. 1 three times a day 9)  Prednisone 20 Mg Tabs (Prednisone) .... One half daily 10)  Zegerid 40-1100 Mg Caps (Omeprazole-sodium bicarbonate) .... One twice daily 11)  Crestor 20 Mg Tabs (Rosuvastatin calcium) .[name].. 1 by mouth once daily 12)  Pen Needles 31g X 8 Mm Misc (Insulin pen needle) .... Use as directed 13)  Remicade 100 Mg Solr (Infliximab) .... Injection every 6 weeks 14)  Fluticasone Propionate 50 Mcg/act Susp (Fluticasone propionate) .... 2 sprays each nostril once daily 15)  Furosemide 20 Mg Tabs (Furosemide) .... Take one (1) by mouth daily 16)  Vicodin 5-500 Mg Tabs (Hydrocodone-acetaminophen) .... One tab every 6 hours as needed 17)  Vitamin D 1000 Unit Caps (Cholecalciferol) .... Two times a day 18)  Estrace 1 Mg Tabs (Estradiol) .... Take 1 tablet by mouth once a day 19)  Slow Release Iron 47.5 Mg Cr-tabs (Ferrous sulfate) .... Once daily 20)  Multiple Vitamin Tabs (Multiple vitamin) .... Once daily 21)  Tussionex Pennkinetic Er 8-10 Mg/20ml Lqcr (Chlorpheniramine-hydrocodone) .... Take 1 teaspoon every 12 hrs as needed cough 22)  Restasis 0.05 % Emul (Cyclosporine) .[name]..  1 drop each eye daily 23)  Lotemax 0.5 % Susp (Loteprednol etabonate) .[name].. 1 drop each eye every 6 hours as needed 24)  Utac 500-500 Mg Tabs (Methenamine mand-sod phosphate) .... Two times a day 25)  Zantac 150 Mg Tabs (Ranitidine hcl) .... One daily 26)  Tessalon Perles 100 Mg Caps (Benzonatate) .... One to two by mouth 3-4 times daily 27)  Diflucan 100 Mg Tabs (Fluconazole) .... Take two by mouth times one dose then one daily until gone  Patient Instructions: 1)  Please schedule a follow-up appointment in 1 month.

## 2010-11-12 NOTE — Assessment & Plan Note (Signed)
Summary: sob/ mbw   Copy to:  Adella Hare Primary Provider/Referring Provider:  Leeanne Deed  CC:  prod cough with yellow mucus, increased SOB, and some wheezing x2days.  History of Present Illness:  50 year old, white female with chronic pulmonary sarcoidosis.  This is been manifested by cyclic cough and mediastinal adenopathy with associated chest pain.  There are also stable pulmonary nodules.  The patient has been intolerant of systemic corticosteroids.  She also had adverse side effects to Arava(leflunamide).    On this basis we referred the patient in November 2008  to Dr. Azzie Roup,  for Remicade injections.  There has been a positive response to Rx.  December 18, 2008-now on pred 5/d  on remecade every 6weeks The pts cough is better.  There is some chest pain.  Pt notes some sinus pressure.  Some postnasal drip.  May 01, 2009 --Presents for an acute office visit. Admitted 04/27/2009- 04/30/2009 for  Sarcoidosis with acute exacerbation.   She was admitted w/ increased cough, dyspnea and weakness. CT chest was     negative for PE and showed no intrathoracic abnormalities.   Sputum cx was neg. She was treated w/ steorids, aggressive pulmonary toilet. She did have a lactic acidosis.  w/ lactic acid level as high as 8, last check showed at 2.6. Felt to be from r hyperventilation at the time of admission. She did improve but cough and tightness did not totally resolve. Today she says she has been having dry cough. Discharge summary says pred 10mg  however she is on 5mg  once daily. Dyspnea is worse in heat (temps >90 outside).    May 07, 2009-change QVAR to Symbicort 160.4.8mcg 2 puffs two times a day (brush, rinse and gargle after use)Increase Prednisone 20mg  once daily for 1 week, then hold at 10mg  once daily until next office viist. Still dyspneic at rest.  May 30, 2009 Still with cough that is dry.  Cough is much less than one month ago with hosp admit. To have endo on  8/24   June 10, 2009 -At last ov we reduced pred to 15mg /d. Pt now with cough is dry and occ yellow.Dyspnea persists  August 01, 2009 Last visit , Depo medrol shot,  Augmentin 875mg  twice daily for 7 days , Use flonase twice a day two sprays each nostril for 4 days then reduce to once daily Stay on prednisone 15mg  daily diflucan for 5 days Now not as much cough,  not as dyspneic.  Some pndrip and has hoarseness  August 05, 2009--Returns for follow up and prod cough with yellow mucus, increased SOB, some wheezing x2days. Last visit Prednisone decreased to 10mg  once daily . breathing has worsened. for last 2 days. Denies chest pain,  , orthopnea, hemoptysis, fever, n/v/d, edema, headache. August 05, 2009 --Presents for   Medications Prior to Update: 1)  Atenolol-Chlorthalidone 50-25 Mg Tabs (Atenolol-Chlorthalidone) .... Take 1 Tablet By Mouth Once A Day 2)  Effexor Xr 75 Mg Cp24 (Venlafaxine Hcl) .... Take 1 Tablet By Mouth Once A Day 3)  Lorazepam 1 Mg Tabs (Lorazepam) .... One Every 6 Hrs As Needed 4)  Novolog Flexpen 100 Unit/ml Soln (Insulin Aspart) .... Three Times A Day As Directed 5)  Onetouch Ultra Test  Strp (Glucose Blood) .... Tid 6)  Potassium Chloride Crys Cr 20 Meq Tbcr (Potassium Chloride Crys Cr) .[name].. 1 Three Times A Day 7)  Prednisone 10 Mg Tabs (Prednisone) .[name].. 1  Once Daily 8)  Zegerid 40-1100 Mg Caps (Omeprazole-Sodium  Bicarbonate) .... Take 1 Tablet By Mouth Once A Day 9)  Pen Needles 31g X 8 Mm  Misc (Insulin Pen Needle) .... Use As Directed 10)  Remicade 100 Mg  Solr (Infliximab) .... Injection Every 6 Weeks 11)  Fluticasone Propionate 50 Mcg/act  Susp (Fluticasone Propionate) .... 2 Sprays Each Nostril Once Daily 12)  Furosemide 20 Mg Tabs (Furosemide) .... Take One (1) By Mouth Daily 13)  Vicodin 5-500 Mg  Tabs (Hydrocodone-Acetaminophen) .... One Tab Every 6 Hours As Needed 14)  Estrace 1 Mg Tabs (Estradiol) .... Take 1 Tablet By Mouth Once A Day 15)  Restasis  0.05 % Emul (Cyclosporine) .[name].. 1 Drop Each Eye Daily 16)  Lotemax 0.5 % Susp (Loteprednol Etabonate) .[name].. 1 Drop Each Eye Every 6 Hours As Needed 17)  Utac 500-500 Mg Tabs (Methenamine Mand-Sod Phosphate) .... Two Times A Day 18)  Fluticasone Propionate 50 Mcg/act Susp (Fluticasone Propionate) .... 2 Sprays Each Nostril Daily  Current Medications (verified): 1)  Atenolol-Chlorthalidone 50-25 Mg Tabs (Atenolol-Chlorthalidone) .... Take 1 Tablet By Mouth Once A Day 2)  Effexor Xr 75 Mg Cp24 (Venlafaxine Hcl) .... Take 1 Tablet By Mouth Once A Day 3)  Lorazepam 1 Mg Tabs (Lorazepam) .... One Every 6 Hrs As Needed 4)  Novolog Flexpen 100 Unit/ml Soln (Insulin Aspart) .... Three Times A Day As Directed 5)  Onetouch Ultra Test  Strp (Glucose Blood) .... Tid 6)  Potassium Chloride Crys Cr 20 Meq Tbcr (Potassium Chloride Crys Cr) .[name].. 1 Three Times A Day 7)  Prednisone 10 Mg Tabs (Prednisone) .[name].. 1  Once Daily 8)  Zegerid 40-1100 Mg Caps (Omeprazole-Sodium Bicarbonate) .... Take 1 Tablet By Mouth Once A Day 9)  Pen Needles 31g X 8 Mm  Misc (Insulin Pen Needle) .... Use As Directed 10)  Remicade 100 Mg  Solr (Infliximab) .... Injection Every 6 Weeks 11)  Fluticasone Propionate 50 Mcg/act  Susp (Fluticasone Propionate) .... 2 Sprays Each Nostril Once Daily 12)  Furosemide 20 Mg Tabs (Furosemide) .... Take One (1) By Mouth Daily 13)  Vicodin 5-500 Mg  Tabs (Hydrocodone-Acetaminophen) .... One Tab Every 6 Hours As Needed 14)  Estrace 1 Mg Tabs (Estradiol) .... Take 1 Tablet By Mouth Once A Day 15)  Restasis 0.05 % Emul (Cyclosporine) .[name].. 1 Drop Each Eye Daily 16)  Lotemax 0.5 % Susp (Loteprednol Etabonate) .[name].. 1 Drop Each Eye Every 6 Hours As Needed 17)  Utac 500-500 Mg Tabs (Methenamine Mand-Sod Phosphate) .... Two Times A Day 18)  Fluticasone Propionate 50 Mcg/act Susp (Fluticasone Propionate) .... 2 Sprays Each Nostril Daily  Allergies (verified): 1)  ! Methotrexate  Past History:  Past  Medical History: Last updated: 04/21/2009 EDEMA (ICD-782.3) HEADACHE (ICD-784.0) MENOPAUSAL SYNDROME (ICD-627.2) UTI (ICD-599.0) SARCOIDOSIS (ICD-135)   -CT Chest 6/08 improvement in Mediastinal LAN   -11/08 start of remecade Rx per [facility] PULMONARY NODULE (ICD-518.89)    -CT Chest 6/08 reduction in size of nodules due to Arava and Pred RX     -probable granulomas due to Sarcoidosis HYPERTENSION (ICD-401.9) HYPERLIPIDEMIA (ICD-272.4) GERD (ICD-530.81) Sleep APnea DIABETES MELLITUS, TYPE II (ICD-250.00) ANEMIA-NOS (ICD-285.9)    Past Surgical History: Last updated: 08/21/2006 Ventral Hernia Repair Nissen Fundoplication Cholecystectomy Hysterectomy  Family History: Last updated: 05/12/2009 father-alive MI, DM mother alive and well uncle with SARCOID No FH of Colon Cancer:  Social History: Last updated: 05/12/2009 Married Never Smoked Regular exercise-no Alcohol use-no Illicit Drug Use - no  Risk Factors: Exercise: no (05/31/2007)  Risk Factors: Smoking Status: never (08/04/2009)  Review of Systems      See HPI  Vital Signs:  Patient profile:   50 year old female Height:      66 inches Weight:      280 pounds BMI:     45.36 O2 Sat:      97 % on Room air Pulse rate:   91 / minute BP sitting:   124 / 66  (left arm) Cuff size:   large  Vitals Entered By: Boone Master CNA (August 05, 2009 4:19 PM)  O2 Flow:  Room air CC: prod cough with yellow mucus, increased SOB, some wheezing x2days Is Patient Diabetic? No Comments Medications reviewed with patient Daytime contact number verified with patient. Boone Master CNA  August 05, 2009 4:19 PM    Physical Exam  Additional Exam:   GEN: A/Ox3; pleasant , NAD, obese female.  HEENT:  [place]/AT, , EACs-clear, TMs-wnl, NOSE-purulence in nares,  nasal edema THROAT-clear NECK:  Supple w/ fair ROM; no JVD; normal carotid impulses w/o bruits; no thyromegaly or nodules palpated; no  lymphadenopathy. RESP  Coarse BS w/ no wheeizng, has some mild upper airway psuedowheezing on forced exp.  CARD:  RRR, no m/r/g   GI:   Soft & nt; nml bowel sounds; no organomegaly or masses detected. [place]: Warm bil,  no calf tenderness edema, clubbing, pulses intact Neuro: intact w/ no focal deficits.    Impression & Recommendations:  Problem # 1:  SARCOIDOSIS (ICD-135) Sarcoid w/ bronchitic flare.  REC:  Increase Prednisone 40mg  once daily for 3 days, then 30mg  once daily for 3 days, then 20mg  once daily for 1 week, then 15mg  once daily for week then 10mg  once daily and hold.  Mucinex DM two times a day as needed cough/congestion Tessalon three times a day as needed cough Tussionex 1 tsp every 12 hr as needed couigh, may make you sleepy.  Please contact office for sooner follow up if symptoms do not improve or worsen  follow up Dr. Delford Field in 1 month.   Orders: Nebulizer Tx (40981) Est. Patient Level IV (19147)  Medications Added to Medication List This Visit: 1)  Prednisone 10 Mg Tabs (Prednisone) .... Take as directed. 2)  Tussionex Pennkinetic Er 8-10 Mg/27ml Lqcr (Chlorpheniramine-hydrocodone) .[name].. 1 tsp every 12 hr as needed for cough, may make you sleepy  Complete Medication List: 1)  Atenolol-chlorthalidone 50-25 Mg Tabs (Atenolol-chlorthalidone) .... Take 1 tablet by mouth once a day 2)  Effexor Xr 75 Mg Cp24 (Venlafaxine hcl) .... Take 1 tablet by mouth once a day 3)  Lorazepam 1 Mg Tabs (Lorazepam) .... One every 6 hrs as needed 4)  Novolog Flexpen 100 Unit/ml Soln (Insulin aspart) .... Three times a day as directed 5)  Onetouch Ultra Test Strp (Glucose blood) .... Tid 6)  Potassium Chloride Crys Cr 20 Meq Tbcr (Potassium chloride crys cr) .[name].. 1 three times a day 7)  Prednisone 10 Mg Tabs (Prednisone) .... Take as directed. 8)  Zegerid 40-1100 Mg Caps (Omeprazole-sodium bicarbonate) .... Take 1 tablet by mouth once a day 9)  Pen Needles 31g X 8 Mm Misc (Insulin pen  needle) .... Use as directed 10)  Remicade 100 Mg Solr (Infliximab) .... Injection every 6 weeks 11)  Fluticasone Propionate 50 Mcg/act Susp (Fluticasone propionate) .... 2 sprays each nostril once daily 12)  Furosemide 20 Mg Tabs (Furosemide) .... Take one (1) by mouth daily 13)  Vicodin 5-500 Mg Tabs (Hydrocodone-acetaminophen) .... One tab  every 6 hours as needed 14)  Estrace 1 Mg Tabs (Estradiol) .... Take 1 tablet by mouth once a day 15)  Restasis 0.05 % Emul (Cyclosporine) .[name].. 1 drop each eye daily 16)  Lotemax 0.5 % Susp (Loteprednol etabonate) .[name].. 1 drop each eye every 6 hours as needed 17)  Utac 500-500 Mg Tabs (Methenamine mand-sod phosphate) .... Two times a day 18)  Fluticasone Propionate 50 Mcg/act Susp (Fluticasone propionate) .... 2 sprays each nostril daily 19)  Fluticasone Propionate 50 Mcg/act Susp (Fluticasone propionate) .... 2 sprays per nostril once daily 20)  Tussionex Pennkinetic Er 8-10 Mg/21ml Lqcr (Chlorpheniramine-hydrocodone) .[name].. 1 tsp every 12 hr as needed for cough, may make you sleepy  Patient Instructions: 1)  Increase Prednisone 40mg  once daily for 3 days, then 30mg  once daily for 3 days, then 20mg  once daily for 1 week, then 15mg  once daily for week then 10mg  once daily and hold.  2)  Mucinex DM two times a day as needed cough/congestion 3)  Tessalon three times a day as needed cough 4)  Tussionex 1 tsp every 12 hr as needed couigh, may make you sleepy.  5)  Please contact office for sooner follow up if symptoms do not improve or worsen  6)  follow up Dr. Delford Field in 1 month.  Prescriptions: PREDNISONE 10 MG TABS (PREDNISONE) take as directed.  #60 x 2   Entered and Authorized by:   Rubye Oaks NP   Signed by:   Rubye Oaks NP on 08/05/2009   Method used:   Electronically to        [facility] [address] (retail)       [id number]       [place], [place]  [address]       Ph: [phone]       Fax: [phone]   RxID:    [id number] Sandria Senter ER 8-10 MG/5ML LQCR (CHLORPHENIRAMINE-HYDROCODONE) 1 tsp every 12 hr as needed for cough, may make you sleepy  #4 oz x 0   Entered and Authorized by:   Rubye Oaks NP   Signed by:   Tammy Parrett NP on 08/05/2009   Method used:   Print then Give to Patient   RxID:   [id number]

## 2010-11-12 NOTE — Assessment & Plan Note (Signed)
Summary: 4 month rov/njr   Vital Signs:  Patient Profile:   50 Years Old Female Height:     66 inches Weight:      276 pounds Temp:     98.4 degrees F Pulse rate:   78 / minute BP sitting:   100 / 62  (left arm) Cuff size:   large  Vitals Entered By: Gladis Riffle, RN (September 30, 2008 8:49 AM)                  Referred by:  Chase Picket PCP:  Dr. Birdie Sons  Chief Complaint:  4 month rov, c/o frequency, and pain side and back x 1 week--CBGs dropping to 62 some nights.  History of Present Illness:  Follow-Up Visit      [name] who presents for Follow-up visit.  The patient denies chest pain, palpitations, dizziness, syncope, low blood sugar symptoms, high blood sugar symptoms, edema, SOB, DOE, PND, and orthopnea.  Since the last visit the patient notes no new problems or concerns.  The patient reports taking meds as prescribed, monitoring BP, monitoring blood sugars, and dietary noncompliance.  When questioned about possible medication side effects, the patient notes none.   novolog 35/40/35 at respective meals--has had some low cbgs at night has some dysuria has some sinus sxs---drainage for 3-4 weeks  Past Medical History: EDEMA (ICD-782.3) HEADACHE (ICD-784.0) MENOPAUSAL SYNDROME (ICD-627.2) UTI (ICD-599.0) SARCOIDOSIS (ICD-135)   -CT Chest 6/08 improvement in Mediastinal LAN   -11/08 start of remecade Rx per [facility] PULMONARY NODULE (ICD-518.89)    -CT Chest 6/08 reduction in size of nodules due to Arava and Pred RX     -probable granulomas due to Sarcoidosis HYPERTENSION (ICD-401.9) HYPERLIPIDEMIA (ICD-272.4) GERD (ICD-530.81) DIABETES MELLITUS, TYPE II (ICD-250.00) ANEMIA-NOS (ICD-285.9)    Past Surgical History: Ventral Hernia Repair Nissen Fundoplication Cholecystectomy Hysterectomy  Social History: Married Never Smoked Regular exercise-no Alcohol use-no  Family History: father-alive MI, DM mother alive and  well uncle with SARCOID  no other complaints in a complete ROS     Prior Medication List:  PROAIR HFA 108 (90 BASE) MCG/ACT  AERS (ALBUTEROL SULFATE) 2 puffs three times a day as needed ATENOLOL-CHLORTHALIDONE 50-25 MG TABS (ATENOLOL-CHLORTHALIDONE) 1/1/2 daily BUDEPRION XL 300 MG TB24 (BUPROPION HCL) Take 1 tablet by mouth once a day EFFEXOR XR 75 MG CP24 (VENLAFAXINE HCL) Take 1 capsule by mouth once a day LORAZEPAM 1 MG TABS (LORAZEPAM) prn NOVOLOG FLEXPEN 100 UNIT/ML SOLN (INSULIN ASPART) three times a day as directed ONETOUCH ULTRA TEST  STRP (GLUCOSE BLOOD) tid POTASSIUM CHLORIDE CRYS CR 20 MEQ TBCR (POTASSIUM CHLORIDE CRYS CR) Take 1 tablet by mouth two times a day PREDNISONE 10 MG  TABS (PREDNISONE) 7.5 mg Take 3/4 tablet by mouth once a day and as directed ZEGERID 40-1100 MG CAPS (OMEPRAZOLE-SODIUM BICARBONATE) 2 by mouth daily CRESTOR 20 MG TABS (ROSUVASTATIN CALCIUM) 1 by mouth once daily QVAR 80 MCG/ACT  AERS (BECLOMETHASONE DIPROPIONATE) two puffs twice daily PEN NEEDLES 31G X 8 MM  MISC (INSULIN PEN NEEDLE) USE AS DIRECTED REMICADE 100 MG  SOLR (INFLIXIMAB) Injection every 6 weeks FLUTICASONE PROPIONATE 50 MCG/ACT  SUSP (FLUTICASONE PROPIONATE) 2 sprays each nostril once daily FUROSEMIDE 20 MG TABS (FUROSEMIDE) Take one (1) by mouth daily VICODIN 5-500 MG  TABS (HYDROCODONE-ACETAMINOPHEN) One tab every 6 hours as needed VITAMIN D 1000 UNIT  CAPS (CHOLECALCIFEROL) two times a day BENICAR 40 MG  TABS (OLMESARTAN MEDOXOMIL) 1  by mouth once daily PREMARIN 0.625 MG TABS (ESTROGENS CONJUGATED) take one tab by mouth once daily SLOW RELEASE IRON 47.5 MG CR-TABS (FERROUS SULFATE) once daily MULTIPLE VITAMIN  TABS (MULTIPLE VITAMIN) once daily TUSSIONEX PENNKINETIC ER 8-10 MG/5ML LQCR (CHLORPHENIRAMINE-HYDROCODONE) Take 1 teaspoon every 12 hrs as needed cough RESTASIS 0.05 % EMUL (CYCLOSPORINE) 1 drop each eye daily FISH OIL 500 MG CAPS (OMEGA-3 FATTY ACIDS) once daily LOTEMAX 0.5  % SUSP (LOTEPREDNOL ETABONATE) 1 drop each eye every 6 hours as needed UTAC 500-500 MG TABS (METHENAMINE MAND-SOD PHOSPHATE) two times a day   Current Allergies (reviewed today): ! METHOTREXATE      Physical Exam  General:     cushingoid faciesobese.   Head:     normocephalic and atraumatic Eyes:     PERRLA/EOM intact; conjunctiva and sclera clear Ears:     TMs intact and clear with normal canals Nose:     no deformity, discharge, inflammation, or lesions Neck:     no masses, thyromegaly, or abnormal cervical nodes Chest Wall:     no deformities noted Lungs:     normal respiratory effort, no intercostal retractions, no accessory muscle use, no dullness, and no fremitus.   Heart:     normal rate and regular rhythm.   Abdomen:     soft and non-tender.  obese Msk:     No deformity or scoliosis noted of thoracic or lumbar spine.   Pulses:     R radial normal and L radial normal.   Neurologic:     cranial nerves II-XII intact and gait normal.      Impression & Recommendations:  Problem # 1:  UTI (ICD-599.0) u/a is ok check culture Her updated medication list for this problem includes:    Utac 500-500 Mg Tabs (Methenamine mand-sod phosphate) .[name]..[name] Two times a day  Orders: UA Dipstick w/o Micro (automated)  (81003) T-Culture, Urine (04540-98119)      Problem # 2:  HYPERTENSION (ICD-401.9) Assessment: Improved  Her updated medication list for this problem includes:    Atenolol-chlorthalidone 50-25 Mg Tabs (Atenolol-chlorthalidone) .[name]... 1/1/2 daily    Furosemide 20 Mg Tabs (Furosemide) .[name]... Take one (1) by mouth daily    Benicar 40 Mg Tabs (Olmesartan medoxomil) .[name]... 1 by mouth once daily  BP today: 100/62 Prior BP: 110/60 (09/24/2008)  Prior 10 Yr Risk Heart Disease: 8 % (09/21/2006)  Labs Reviewed: Creat: 0.8 (06/05/2008) Chol: 153 (06/05/2008)   HDL: 46.6 (06/05/2008)   LDL: 72.4 (06/05/2008)   TG: 401 (06/05/2008)  Orders: Venipuncture  ([address]782)   Problem # 3:  HYPERLIPIDEMIA (ICD-272.4) check labs today. Her updated medication list for this problem includes:    Crestor 20 Mg Tabs (Rosuvastatin calcium) .[name]... 1 by mouth once daily  Labs Reviewed: Chol: 153 (06/05/2008)   HDL: 46.6 (06/05/2008)   LDL: 72.4 (06/05/2008)   TG: 401 (06/05/2008) SGOT: 20 (06/05/2008)   SGPT: 17 (06/05/2008)  Prior 10 Yr Risk Heart Disease: 8 % (09/21/2006)   Problem # 4:  DIABETES MELLITUS, TYPE II (ICD-250.00)  Her updated medication list for this problem includes:    Novolog Flexpen 100 Unit/ml Soln (Insulin aspart) .[name]... Three times a day as directed    Benicar 40 Mg Tabs (Olmesartan medoxomil) .[name]... 1 by mouth once daily  Labs Reviewed: HgBA1c: 6.3 (06/05/2008)   Creat: 0.8 (06/05/2008)   Microalbumin: 0.3 (05/24/2007)  Last Eye Exam: normal-pt's report (07/11/2008)  Orders: TLB-A1C / Hgb A1C (Glycohemoglobin) (83036-A1C) TLB-BMP (Basic Metabolic Panel-BMET) (80048-METABOL) TLB-Lipid  Panel (80061-LIPID) TLB-ALT (SGPT) (84460-ALT) TLB-AST (SGOT) (84450-SGOT)   Complete Medication List: 1)  Proair Hfa 108 (90 Base) Mcg/act Aers (Albuterol sulfate) .... 2 puffs three times a day as needed 2)  Atenolol-chlorthalidone 50-25 Mg Tabs (Atenolol-chlorthalidone) .... 1/1/2 daily 3)  Budeprion Xl 300 Mg Tb24 (Bupropion hcl) .... Take 1 tablet by mouth once a day 4)  Effexor Xr 75 Mg Cp24 (Venlafaxine hcl) .... Take 1 capsule by mouth once a day 5)  Lorazepam 1 Mg Tabs (Lorazepam) .... Prn 6)  Novolog Flexpen 100 Unit/ml Soln (Insulin aspart) .... Three times a day as directed 7)  Onetouch Ultra Test Strp (Glucose blood) .... Tid 8)  Potassium Chloride Crys Cr 20 Meq Tbcr (Potassium chloride crys cr) .... Take 1 tablet by mouth two times a day 9)  Prednisone 10 Mg Tabs (Prednisone) .... 7.5 mg take 3/4 tablet by mouth once a day and as directed 10)  Zegerid 40-1100 Mg Caps (Omeprazole-sodium bicarbonate) .... 2 by mouth daily 11)   Crestor 20 Mg Tabs (Rosuvastatin calcium) .[name].. 1 by mouth once daily 12)  Qvar 80 Mcg/act Aers (Beclomethasone dipropionate) .... Two puffs twice daily 13)  Pen Needles 31g X 8 Mm Misc (Insulin pen needle) .... Use as directed 14)  Remicade 100 Mg Solr (Infliximab) .... Injection every 6 weeks 15)  Fluticasone Propionate 50 Mcg/act Susp (Fluticasone propionate) .... 2 sprays each nostril once daily 16)  Furosemide 20 Mg Tabs (Furosemide) .... Take one (1) by mouth daily 17)  Vicodin 5-500 Mg Tabs (Hydrocodone-acetaminophen) .... One tab every 6 hours as needed 18)  Vitamin D 1000 Unit Caps (Cholecalciferol) .... Two times a day 19)  Benicar 40 Mg Tabs (Olmesartan medoxomil) .[name].. 1 by mouth once daily 20)  Premarin 0.9 Mg Tabs (Estrogens conjugated) .... Take 1 tablet by mouth once a day 21)  Slow Release Iron 47.5 Mg Cr-tabs (Ferrous sulfate) .... Once daily 22)  Multiple Vitamin Tabs (Multiple vitamin) .... Once daily 23)  Tussionex Pennkinetic Er 8-10 Mg/71ml Lqcr (Chlorpheniramine-hydrocodone) .... Take 1 teaspoon every 12 hrs as needed cough 24)  Restasis 0.05 % Emul (Cyclosporine) .[name].. 1 drop each eye daily 25)  Fish Oil 500 Mg Caps (Omega-3 fatty acids) .... Once daily 26)  Lotemax 0.5 % Susp (Loteprednol etabonate) .[name].. 1 drop each eye every 6 hours as needed 27)  Utac 500-500 Mg Tabs (Methenamine mand-sod phosphate) .... Two times a day  Other Orders: Pneumococcal Vaccine (04540) Admin 1st Vaccine (98119)   Patient Instructions: 1)  Please schedule a follow-up appointment in 3 months.   Prescriptions: PREMARIN 0.9 MG TABS (ESTROGENS CONJUGATED) Take 1 tablet by mouth once a day  #90 x 3   Entered and Authorized by:   Birdie Sons MD   Signed by:   Birdie Sons MD on 09/30/2008   Method used:   Electronically to        [facility] (retail)       [address]       [place], [place]  [address]       Ph: [phone]       Fax:  [phone]   RxID:   [id number]  ]  Pneumovax Vaccine    Vaccine Type: Pneumovax    Site: left deltoid    Mfr: Merck    Dose: 0.5 ml    Route: IM    Given by: Gladis Riffle, RN    Exp. Date: 05/08/2009  Lot #: [id number]   Laboratory Results   Urine Tests    Routine Urinalysis   Color: yellow Appearance: Clear Glucose: negative   (Normal Range: Negative) Bilirubin: negative   (Normal Range: Negative) Ketone: negative   (Normal Range: Negative) Spec. Gravity: 1.025   (Normal Range: 1.003-1.035) Blood: negative   (Normal Range: Negative) pH: 5.5   (Normal Range: 5.0-8.0) Protein: negative   (Normal Range: Negative) Urobilinogen: 0.2   (Normal Range: 0-1) Nitrite: negative   (Normal Range: Negative) Leukocyte Esterace: negative   (Normal Range: Negative)    Comments: Rita Ohara  September 30, 2008 9:40 AM      Orders Added: 1)  UA Dipstick w/o Micro (automated)  [81003] 2)  TLB-A1C / Hgb A1C (Glycohemoglobin) [83036-A1C] 3)  TLB-BMP (Basic Metabolic Panel-BMET) [80048-METABOL] 4)  TLB-Lipid Panel [80061-LIPID] 5)  TLB-ALT (SGPT) [84460-ALT] 6)  TLB-AST (SGOT) [84450-SGOT] 7)  T-Culture, Urine [04540-98119] 8)  Venipuncture [14782] 9)  Est. Patient Level IV [95621] 10)  Pneumococcal Vaccine [90732] 11)  Admin 1st Vaccine [30865]

## 2010-11-12 NOTE — Assessment & Plan Note (Signed)
Summary: Pulmonary OV   Copy to:  Dr. Delford Field Primary Provider/Referring Provider:  Birdie Sons, MD   CC:  3 month follow up, Pt states she has bad headaches a lot more lately and pt states she has been having severe pain under her arm, pain in chest, 6/10, x 1 week, and pt states since the hot weather she has SOB with and without activity.  History of Present Illness: 50 year old, white female with chronic pulmonary sarcoidosis.  This is been manifested by cyclic cough and mediastinal adenopathy with associated chest pain.  There are also stable pulmonary nodules.  She has been intolerant of systemic corticosteroids &  had adverse side effects to Arava(leflunamide). She was referred the patient in November 2008  to Dr. Azzie Roup,  for Remicade injections.  There has been a positive response to Rx. She had severe GERD s/p fundoplication in 2001.  October 20, 2009 5:17 PM  Sleep consult Epworth Sleepiness Score 9/24.  Gained 10 lbs in the last 2 years. Reviewed overnight PSG >> moderate obstructive sleep apnea with predominant hypopneas & RERAs, RDI 25/h,AHI 15/h with nadir desatn to 89%, no arrythmias, PLms.  December 09, 2009 3:59 PM  reviewed & discussed download 1/27 - 2/9 >> avg pr 7 cm, no residual events, needs to improve usage wt 268, lost 20 lbs on wt watchers. Mask Ok, pressure Ok  December 23, 2009 10:32 AM  f/u and cough is ok.   dyspnea is ok.  notes some ant chest discomfort,  is ache like pain.  on weight watchers and lost 20#  April 07, 2010 4:56 PM Chest pain and arm pain for one week.  Cough is the same.  No real wheeze. No use of tussionex No f/c/s.  Notes headaches.  Notes some pndrip.    Preventive Screening-Counseling & Management  Alcohol-Tobacco     Smoking Status: never  Current Medications (verified): 1)  Atenolol-Chlorthalidone 50-25 Mg Tabs (Atenolol-Chlorthalidone) .... Take 1 Tablet By Mouth Once A Day 2)  Effexor Xr 75 Mg Cp24 (Venlafaxine Hcl) .... Take  1 Tablet By Mouth Once A Day 3)  Lorazepam 1 Mg Tabs (Lorazepam) .... One Every 6 Hrs As Needed 4)  Zegerid 40-1100 Mg Caps (Omeprazole-Sodium Bicarbonate) .... Take 1 Tablet By Mouth Once A Day 5)  Remicade 100 Mg  Solr (Infliximab) .... Injection Every 6 Weeks 6)  Omnaris 50 Mcg/act Susp (Ciclesonide) .... One Spray Each Nostril Once Daily 7)  Vicodin 5-500 Mg  Tabs (Hydrocodone-Acetaminophen) .... One Tab Every 6 Hours As Needed 8)  Estrace 1 Mg Tabs (Estradiol) .... Take 1 Tablet By Mouth Once A Day 9)  Restasis 0.05 % Emul (Cyclosporine) .[name].. 1 Drop Each Eye Daily 10)  Lotemax 0.5 % Susp (Loteprednol Etabonate) .[name].. 1 Drop Each Eye Every 6 Hours As Needed 11)  Simvastatin 40 Mg Tabs (Simvastatin) .... Take One Tablet At Bedtime Start One Week After Completing Diflucan 12)  Levbid 0.375 Mg Xr12h-Tab (Hyoscyamine Sulfate) .[name].. 1 By Mouth Two Times A Day As Needed 13)  Prednisone 5 Mg Tabs (Prednisone) .... Take 1 Tablet By Mouth Every Other Day 14)  Cpap 7 Cm 15)  Nystatin-Triamcinolone 100000-0.1 Unit/gm-% Crea (Nystatin-Triamcinolone) .... Apply Two Times A Day To Affected Area 16)  Hydromet 5-1.5 Mg/68ml Syrp (Hydrocodone-Homatropine) .[name].. 1 Tsp Three Times A Day As Needed Cough 17)  Valtrex 1 Gm Tabs (Valacyclovir Hcl) .... Take 1 Tablet By Mouth Three Times A Day 18)  Tussionex Pennkinetic Er 8-10 Mg/26ml  Lqcr (Chlorpheniramine-Hydrocodone) .[name].. 1 Tsp Every 6 Hours As Needed  Allergies (verified): 1)  ! Methotrexate  Past History:  Past medical, surgical, family and social histories (including risk factors) reviewed, and no changes noted (except as noted below).  Past Medical History: Reviewed history from 10/13/2009 and no changes required. EDEMA (ICD-782.3) HEADACHE (ICD-784.0) MENOPAUSAL SYNDROME (ICD-627.2) UTI (ICD-599.0) SARCOIDOSIS (ICD-135)   -CT Chest 6/08 improvement in Mediastinal LAN   -11/08 start of remecade Rx per [facility] PULMONARY NODULE  (ICD-518.89)    -CT Chest 6/08 reduction in size of nodules due to Arava and Pred RX     -probable granulomas due to Sarcoidosis HYPERTENSION (ICD-401.9) HYPERLIPIDEMIA (ICD-272.4) GERD (ICD-530.81) Sleep APnea DIABETES MELLITUS, TYPE II (ICD-250.00) ANEMIA-NOS (ICD-285.9)  Fundic Gland Polyp  Past Surgical History: Reviewed history from 08/21/2006 and no changes required. Ventral Hernia Repair Nissen Fundoplication Cholecystectomy Hysterectomy  Family History: Reviewed history from 10/20/2009 and no changes required. father-alive MI, DM mother alive and well uncle with SARCOID No FH of Colon Cancer: Family History Emphysema-Mother, Father  Family History Asthma-Asthma Family History COPD -mother  Social History: Reviewed history from 10/20/2009 and no changes required. Married, live with husband Never Smoked Regular exercise-no Alcohol use-no Illicit Drug Use - no  Review of Systems       The patient complains of non-productive cough and nasal congestion/difficulty breathing through nose.  The patient denies shortness of breath with activity, shortness of breath at rest, productive cough, coughing up blood, chest pain, irregular heartbeats, acid heartburn, indigestion, loss of appetite, weight change, abdominal pain, difficulty swallowing, sore throat, tooth/dental problems, headaches, sneezing, itching, ear ache, anxiety, depression, hand/feet swelling, joint stiffness or pain, rash, change in color of mucus, and fever.    Vital Signs:  Patient profile:   50 year old female Height:      66 inches Weight:      236 pounds BMI:     38.23 O2 Sat:      99 % on Room air Temp:     98.4 degrees F oral Pulse rate:   87 / minute BP sitting:   138 / 74  (right arm) Cuff size:   large  Vitals Entered By: Carver Fila MA (April 07, 2010 4:20 PM)  O2 Flow:  Room air CC: 3 month follow up, Pt states she has bad headaches a lot more lately and pt states she has been having  severe pain under her arm, pain in chest, 6/10, x 1 week, pt states since the hot weather she has SOB with and without activity Comments Meds and allergies updated Carver Fila MA April 07, 2010 4:26 PM    Physical Exam  Additional Exam:   GEN: A/Ox3; pleasant , NAD, obese female.  HEENT:  [place]/AT, , EACs-clear, TMs-wnl, NOSE-mild erythema, purulence seen R>L THROAT-clear NECK:  Supple w/ fair ROM; no JVD; normal carotid impulses w/o bruits; no thyromegaly or nodules palpated; no lymphadenopathy. RESP  Coarse BS w/ no wheeizng, has some mild upper airway psuedowheezing on forced exp.  CARD:  RRR, no m/r/g   Musco: Warm bil,  no calf tenderness edema, clubbing, pulses intact    Impression & Recommendations:  Problem # 1:  OTHER ACUTE SINUSITIS (ICD-461.8) Assessment Deteriorated acute sinusitis with bronchial flare plan Use veramyst two sprays each nostril daily Augmentin one twice daily for 7 days Use saline rinse twice daily both sinuses No other medication changes Return 4 months or sooner if unimproved Use tylenol or advil  for chest wall pain Her updated medication list for this problem includes:    Omnaris 50 Mcg/act Susp (Ciclesonide) ..... One spray each nostril once daily    Hydromet 5-1.5 Mg/42ml Syrp (Hydrocodone-homatropine) .[name]... 1 tsp three times a day as needed cough    Tussionex Pennkinetic Er 8-10 Mg/68ml Lqcr (Chlorpheniramine-hydrocodone) .[name]... 1 tsp every 6 hours as needed    Amoxicillin-pot Clavulanate 875-125 Mg Tabs (Amoxicillin-pot clavulanate) .[name]... Take one by mouth two times a day    Veramyst 27.5 Mcg/spray Susp (Fluticasone furoate) .[name]..[name] Two puffs each nostril daily  Orders: Est. Patient Level IV (04540)  Problem # 2:  SARCOIDOSIS (ICD-135) Assessment: Unchanged stable   Medications Added to Medication List This Visit: 1)  Tussionex Pennkinetic Er 8-10 Mg/52ml Lqcr (Chlorpheniramine-hydrocodone) .[name].. 1 tsp every 6 hours as needed 2)  Amoxicillin-pot  Clavulanate 875-125 Mg Tabs (Amoxicillin-pot clavulanate) .... Take one by mouth two times a day 3)  Veramyst 27.5 Mcg/spray Susp (Fluticasone furoate) .... Two puffs each nostril daily  Complete Medication List: 1)  Atenolol-chlorthalidone 50-25 Mg Tabs (Atenolol-chlorthalidone) .... Take 1 tablet by mouth once a day 2)  Effexor Xr 75 Mg Cp24 (Venlafaxine hcl) .... Take 1 tablet by mouth once a day 3)  Lorazepam 1 Mg Tabs (Lorazepam) .... One every 6 hrs as needed 4)  Remicade 100 Mg Solr (Infliximab) .... Injection every 6 weeks 5)  Omnaris 50 Mcg/act Susp (Ciclesonide) .... One spray each nostril once daily 6)  Vicodin 5-500 Mg Tabs (Hydrocodone-acetaminophen) .... One tab every 6 hours as needed 7)  Estrace 1 Mg Tabs (Estradiol) .... Take 1 tablet by mouth once a day 8)  Restasis 0.05 % Emul (Cyclosporine) .[name].. 1 drop each eye daily 9)  Lotemax 0.5 % Susp (Loteprednol etabonate) .[name].. 1 drop each eye every 6 hours as needed 10)  Simvastatin 40 Mg Tabs (Simvastatin) .... Take one tablet at bedtime start one week after completing diflucan 11)  Levbid 0.375 Mg Xr12h-tab (Hyoscyamine sulfate) .[name].. 1 by mouth two times a day as needed 12)  Prednisone 5 Mg Tabs (Prednisone) .... Take 1 tablet by mouth every other day 13)  Cpap 7 Cm  14)  Nystatin-triamcinolone 100000-0.1 Unit/gm-% Crea (Nystatin-triamcinolone) .... Apply two times a day to affected area 15)  Hydromet 5-1.5 Mg/67ml Syrp (Hydrocodone-homatropine) .[name].. 1 tsp three times a day as needed cough 16)  Valtrex 1 Gm Tabs (Valacyclovir hcl) .... Take 1 tablet by mouth three times a day 17)  Tussionex Pennkinetic Er 8-10 Mg/40ml Lqcr (Chlorpheniramine-hydrocodone) .[name].. 1 tsp every 6 hours as needed 18)  Amoxicillin-pot Clavulanate 875-125 Mg Tabs (Amoxicillin-pot clavulanate) .... Take one by mouth two times a day 19)  Veramyst 27.5 Mcg/spray Susp (Fluticasone furoate) .... Two puffs each nostril daily  Patient Instructions: 1)  Use veramyst  two sprays each nostril daily 2)  Augmentin one twice daily for 7 days 3)  Use saline rinse twice daily both sinuses 4)  No other medication changes 5)  Return 4 months or sooner if unimproved 6)  Use tylenol or advil for chest wall pain Prescriptions: AMOXICILLIN-POT CLAVULANATE 875-125 MG TABS (AMOXICILLIN-POT CLAVULANATE) take one by mouth two times a day  #14 x 0   Entered and Authorized by:   Storm Frisk MD   Signed by:   Storm Frisk MD on 04/07/2010   Method used:   Electronically to        [facility] (retail)       [address]  [place], [place]  [address]       Ph: [phone]       Fax: [phone]   RxID:   [id number]

## 2010-11-12 NOTE — Assessment & Plan Note (Signed)
Summary: 7 wks ov/nta    Vital Signs:  Patient Profile:   50 Years Old Female Height:     66 inches Weight:      272 pounds Temp:     98.2 degrees F oral Pulse rate:   80 / minute Pulse rhythm:   regular Resp:     16 per minute BP sitting:   142 / 82  Vitals Entered By: Lynann Beaver CMA (November 03, 2007 9:32 AM)                 Chief Complaint:  re check.  History of Present Illness:  SARCOIDOSIS (ICD-135)-followed by Dr. Milinda Pointer been getting remicade every 8 weeks tolerating meds without dificulty---she understands the side efects and when to sek medical attention MEDIASTINAL LYMPHADENOPATHY (ICD-785.6)-likely related to sarcoid HYPERTENSION (ICD-401.9)-tolerating meds, no homeBPS HYPERLIPIDEMIA (ICD-272.4)-doing well on current meds. (crestor) GERD (ICD-530.81)-tolerating PPI DIABETES MELLITUS, TYPE II (ICD-250.00)-tolerating meds without difficulty, home CBGs---down to 15 mg of prednisone ANEMIA-NOS (ICD-285.9)  Current Meds:  ALBUTEROL 90 MCG/ACT AERS (ALBUTEROL) Inhale 2 puff as directed three times a day ATENOLOL-CHLORTHALIDONE 50-25 MG TABS (ATENOLOL-CHLORTHALIDONE) 1/1/2 daily BUDEPRION XL 300 MG TB24 (BUPROPION HCL) Take 1 tablet by mouth once a day EFFEXOR XR 75 MG CP24 (VENLAFAXINE HCL) Take 1 capsule by mouth once a day LORAZEPAM 1 MG TABS (LORAZEPAM) prn NOVOLOG FLEXPEN 100 UNIT/ML SOLN (INSULIN ASPART)  ONETOUCH ULTRA TEST  STRP (GLUCOSE BLOOD)  POTASSIUM CHLORIDE CRYS CR 20 MEQ TBCR (POTASSIUM CHLORIDE CRYS CR) Take 1 tablet by mouth once a day PREDNISONE 10 MG  TABS (PREDNISONE) 1 1/2 once daily REGLAN 10 MG TABS (METOCLOPRAMIDE HCL) Take 1 tablet by mouth twice a day ZEGERID 40-1100 MG CAPS (OMEPRAZOLE-SODIUM BICARBONATE) 2 by mouth daily CRESTOR 20 MG TABS (ROSUVASTATIN CALCIUM) 1 by mouth once daily QVAR 80 MCG/ACT  AERS (BECLOMETHASONE DIPROPIONATE) two puffs twice daily PEN NEEDLES 31G X 8 MM  MISC (INSULIN PEN NEEDLE) USE AS DIRECTED NEED  F/U APPT B-4 ADDTIONAL REFILLS PREMARIN 0.3 MG TABS (ESTROGENS CONJUGATED) once daily CEFTIN 250 MG  TABS (CEFUROXIME AXETIL) one by mouth bid REMICADE 100 MG  SOLR (INFLIXIMAB) Injection every 2 weeks      Current Allergies: ! METHOTREXATE  Past Medical History:    Reviewed history from 09/15/2007 and no changes required:       Anemia-NOS       Diabetes mellitus, type II       GERD       Hyperlipidemia       Hypertension       pulmonary nodule       OSA       Vocal Cord dysfunction with recurrent cough       Asthma vs Vocal cord dysfundtion       SARCOID  Past Surgical History:    Reviewed history from 08/21/2006 and no changes required:       Ventral Hernia Repair       Nissen Fundoplication       Cholecystectomy       Hysterectomy   Family History:    Reviewed history from 09/15/2007 and no changes required:       father-alive MI, DM       mother alive and well       uncle with SARCOID  Social History:    Reviewed history from 05/31/2007 and no changes required:       Married       Never Smoked  Regular exercise-no       Alcohol use-no    Review of Systems       no other complaints in a complete ROS    Physical Exam  General:     Well-developed,well-nourished,in no acute distress; alert,appropriate and cooperative throughout examination Head:     normocephalic and atraumatic.   Eyes:     pupils equal and pupils round.   Neck:     No deformities, masses, or tenderness noted. Chest Wall:     no deformities and no tenderness.   Lungs:     Normal respiratory effort, chest expands symmetrically. Lungs are clear to auscultation, no crackles or wheezes. Heart:     Normal rate and regular rhythm. S1 and S2 normal without gallop, murmur, click, rub or other extra sounds. Abdomen:     Bowel sounds positive,abdomen soft and non-tender without masses, organomegaly or hernias noted. Msk:     No deformity or scoliosis noted of thoracic or lumbar  spine.   Pulses:     R radial normal and L radial normal.   Extremities:     No clubbing, cyanosis, edema, or deformity noted   Neurologic:     cranial nerves II-XII intact and gait normal.       Impression & Recommendations:  Problem # 1:  DIABETES MELLITUS, TYPE II (ICD-250.00) continue current meds Her updated medication list for this problem includes:    Novolog Flexpen 100 Unit/ml Soln (Insulin aspart)  Labs Reviewed: HgBA1c: 7.0 (08/25/2007)   Creat: 0.7 (08/25/2007)      Problem # 2:  HYPERTENSION (ICD-401.9) tolerating meds monito HOME BPS Her updated medication list for this problem includes:    Atenolol-chlorthalidone 50-25 Mg Tabs (Atenolol-chlorthalidone) .[name]... 1/1/2 daily  BP today: 142/82 Prior BP: 132/80 (10/25/2007)  Prior 10 Yr Risk Heart Disease: 8 % (09/21/2006)  Labs Reviewed: Creat: 0.7 (08/25/2007) Chol: 197 (10/27/2007)   HDL: 41.6 (10/27/2007)   LDL: DEL (10/27/2007)   TG: 353 (10/27/2007)   Problem # 3:  GERD (ICD-530.81) doing well on current meds Her updated medication list for this problem includes:    Zegerid 40-1100 Mg Caps (Omeprazole-sodium bicarbonate) .[name]... 2 by mouth daily   Problem # 4:  HYPERLIPIDEMIA (ICD-272.4) continue current meds Her updated medication list for this problem includes:    Crestor 20 Mg Tabs (Rosuvastatin calcium) .[name]... 1 by mouth once daily  Labs Reviewed: Chol: 197 (10/27/2007)   HDL: 41.6 (10/27/2007)   LDL: DEL (10/27/2007)   TG: 353 (10/27/2007) SGOT: 23 (10/27/2007)   SGPT: 29 (10/27/2007)  Prior 10 Yr Risk Heart Disease: 8 % (09/21/2006)   Complete Medication List: 1)  Albuterol 90 Mcg/act Aers (Albuterol) .... Inhale 2 puff as directed three times a day 2)  Atenolol-chlorthalidone 50-25 Mg Tabs (Atenolol-chlorthalidone) .... 1/1/2 daily 3)  Budeprion Xl 300 Mg Tb24 (Bupropion hcl) .... Take 1 tablet by mouth once a day 4)  Effexor Xr 75 Mg Cp24 (Venlafaxine hcl) .... Take 1 capsule by mouth  once a day 5)  Lorazepam 1 Mg Tabs (Lorazepam) .... Prn 6)  Novolog Flexpen 100 Unit/ml Soln (Insulin aspart) 7)  Onetouch Ultra Test Strp (Glucose blood) 8)  Potassium Chloride Crys Cr 20 Meq Tbcr (Potassium chloride crys cr) .... Take 1 tablet by mouth once a day 9)  Prednisone 10 Mg Tabs (Prednisone) .[name].. 1 1/2 once daily 10)  Reglan 10 Mg Tabs (Metoclopramide hcl) .... Take 1 tablet by mouth twice a day 11)  Zegerid 40-1100 Mg  Caps (Omeprazole-sodium bicarbonate) .... 2 by mouth daily 12)  Crestor 20 Mg Tabs (Rosuvastatin calcium) .[name].. 1 by mouth once daily 13)  Qvar 80 Mcg/act Aers (Beclomethasone dipropionate) .... Two puffs twice daily 14)  Pen Needles 31g X 8 Mm Misc (Insulin pen needle) .... Use as directed need f/u appt b-4 addtional refills 15)  Premarin 0.3 Mg Tabs (Estrogens conjugated) .... Once daily 16)  Ceftin 250 Mg Tabs (Cefuroxime axetil) .... One by mouth bid 17)  Remicade 100 Mg Solr (Infliximab) .... Injection every 2 weeks   Patient Instructions: 1)  Please schedule a follow-up appointment in 3 months. 2)  labs one week prior to visit 3)  lipids---272.4 4)  lfts-995.2 5)  bmet-995.2 6)  A1C-250.02 7)       Prescriptions: CRESTOR 20 MG TABS (ROSUVASTATIN CALCIUM) 1 by mouth once daily  #30 x 11   Entered and Authorized by:   Birdie Sons MD   Signed by:   Birdie Sons MD on 11/03/2007   Method used:   Electronically sent to ...       [facility]*       [address]       [place], [place]  [address]       Ph: [phone]       Fax: [phone]   RxID:   [id number]  ]

## 2010-11-12 NOTE — Assessment & Plan Note (Signed)
Summary: consult re: earache/cjr   Vital Signs:  Patient profile:   50 year old female Menstrual status:  hysterectomy Weight:      234 pounds Temp:     98.3 degrees F oral Pulse rate:   64 / minute Pulse rhythm:   regular Resp:     16 per minute BP sitting:   128 / 80  (left arm) Cuff size:   large  Vitals Entered By: Gladis Riffle, RN (June 03, 2010 11:39 AM) CC: c/o bilateral earache down into neck--alsonot feeling well since remicade on Mon Is Patient Diabetic? Yes Did you bring your meter with you today? No Comments CBGs 120 or lower at home     Menstrual Status hysterectomy   Primary Care Provider:  Birdie Sons, MD   CC:  c/o bilateral earache down into neck--alsonot feeling well since remicade on Mon.  History of Present Illness: Remicade on monday (sarcoid) since then has felt generally poorly: fatigue, some intermittnet abdominal pain (at previous hernia repair site), earache no fever or chills Remicade has not caused simialr sxs previously  All other systems reviewed and were negative   Preventive Screening-Counseling & Management  Alcohol-Tobacco     Smoking Status: never  Current Medications (verified): 1)  Atenolol-Chlorthalidone 50-25 Mg Tabs (Atenolol-Chlorthalidone) .... Take 1 Tablet By Mouth Once A Day 2)  Effexor Xr 75 Mg Cp24 (Venlafaxine Hcl) .... Take 1 Tablet By Mouth Once A Day 3)  Lorazepam 1 Mg Tabs (Lorazepam) .... One Every 6 Hrs As Needed 4)  Remicade 100 Mg  Solr (Infliximab) .... Injection Every 6 Weeks 5)  Omnaris 50 Mcg/act Susp (Ciclesonide) .... One Spray Each Nostril Once Daily 6)  Estrace 1 Mg Tabs (Estradiol) .... Take 1 Tablet By Mouth Once A Day 7)  Restasis 0.05 % Emul (Cyclosporine) .[name].. 1 Drop Each Eye Daily 8)  Lotemax 0.5 % Susp (Loteprednol Etabonate) .[name].. 1 Drop Each Eye Every 6 Hours As Needed 9)  Simvastatin 40 Mg Tabs (Simvastatin) .... Take One Tablet At Bedtime Start One Week After Completing Diflucan 10)  Levbid  0.375 Mg Xr12h-Tab (Hyoscyamine Sulfate) .[name].. 1 By Mouth Two Times A Day As Needed 11)  Prednisone 5 Mg Tabs (Prednisone) .... Take 1 Tablet By Mouth Every Other Day 12)  Cpap 7 Cm 13)  Nystatin-Triamcinolone 100000-0.1 Unit/gm-% Crea (Nystatin-Triamcinolone) .... Apply Two Times A Day To Affected Area 14)  Hydromet 5-1.5 Mg/61ml Syrp (Hydrocodone-Homatropine) .[name].. 1 Tsp Three Times A Day As Needed Cough 15)  Valtrex 1 Gm Tabs (Valacyclovir Hcl) .... Take 1 Tablet By Mouth Three Times A Day 16)  Furosemide 40 Mg Tabs (Furosemide) .... Qd 17)  Klor-Con M20 20 Meq Cr-Tabs (Potassium Chloride Crys Cr) .... Take 1 Tablet By Mouth Once A Day 18)  Budeprion Xl 300 Mg Xr24h-Tab (Bupropion Hcl) .... Take 1 Tablet By Mouth Once A Day  Allergies: 1)  ! Methotrexate  Physical Exam  General:  alert and well-developed.   Ears:  R ear normal and L ear normal.   Mouth:  pharynx pink and moist.   Neck:  supple.   Chest Wall:  marked tenderness costochondral joints 2-7 Lungs:  normal respiratory effort and no intercostal retractions.   Heart:  normal rate and regular rhythm.   Abdomen:  soft and non-tender.   Msk:  No deformity or scoliosis noted of thoracic or lumbar spine.   Neurologic:  cranial nerves II-XII intact and gait normal.     Impression & Recommendations:  Problem # 1:  FATIGUE (ICD-780.79) suspect viral syndrome--doubt remicade related but needs further evaluation check labs  emperically increase prednisone to 20 mg by mouth once daily for 5 days and then resume usual dose side effects discussed. Orders: Venipuncture (46270) TLB-BMP (Basic Metabolic Panel-BMET) (80048-METABOL) TLB-Hepatic/Liver Function Pnl (80076-HEPATIC) TLB-CBC Platelet - w/Differential (85025-CBCD) TLB-TSH (Thyroid Stimulating Hormone) (84443-TSH)  Complete Medication List: 1)  Atenolol-chlorthalidone 50-25 Mg Tabs (Atenolol-chlorthalidone) .... Take 1 tablet by mouth once a day 2)  Effexor Xr 75 Mg Cp24  (Venlafaxine hcl) .... Take 1 tablet by mouth once a day 3)  Lorazepam 1 Mg Tabs (Lorazepam) .... One every 6 hrs as needed 4)  Remicade 100 Mg Solr (Infliximab) .... Injection every 6 weeks 5)  Omnaris 50 Mcg/act Susp (Ciclesonide) .... One spray each nostril once daily 6)  Estrace 1 Mg Tabs (Estradiol) .... Take 1 tablet by mouth once a day 7)  Restasis 0.05 % Emul (Cyclosporine) .[name].. 1 drop each eye daily 8)  Lotemax 0.5 % Susp (Loteprednol etabonate) .[name].. 1 drop each eye every 6 hours as needed 9)  Simvastatin 40 Mg Tabs (Simvastatin) .... Take one tablet at bedtime start one week after completing diflucan 10)  Levbid 0.375 Mg Xr12h-tab (Hyoscyamine sulfate) .[name].. 1 by mouth two times a day as needed 11)  Prednisone 5 Mg Tabs (Prednisone) .... Take 1 tablet by mouth every other day 12)  Cpap 7 Cm  13)  Nystatin-triamcinolone 100000-0.1 Unit/gm-% Crea (Nystatin-triamcinolone) .... Apply two times a day to affected area 14)  Hydromet 5-1.5 Mg/59ml Syrp (Hydrocodone-homatropine) .[name].. 1 tsp three times a day as needed cough 15)  Valtrex 1 Gm Tabs (Valacyclovir hcl) .... Take 1 tablet by mouth three times a day 16)  Furosemide 40 Mg Tabs (Furosemide) .... Qd 17)  Klor-con M20 20 Meq Cr-tabs (Potassium chloride crys cr) .... Take 1 tablet by mouth once a day 18)  Budeprion Xl 300 Mg Xr24h-tab (Bupropion hcl) .... Take 1 tablet by mouth once a day 19)  Prednisone 20 Mg Tabs (Prednisone) .... Take 1 tablet by mouth once a day for 5 days Prescriptions: PREDNISONE 20 MG TABS (PREDNISONE) Take 1 tablet by mouth once a day for 5 days  #5 x 0   Entered and Authorized by:   Birdie Sons MD   Signed by:   Birdie Sons MD on 06/03/2010   Method used:   Electronically to        [facility]* (retail)       [address]       [place], [place]  [address]       Ph: [phone]       Fax: [phone]   RxID:   [id number]

## 2010-11-12 NOTE — Discharge Summary (Signed)
Summary: Discharge Summary   NAME:  [name], [name]              ACCOUNT NO.:  [id number]   MEDICAL RECORD NO.:  [id number]          PATIENT TYPE:  INP   LOCATION:  4704                         FACILITY:  [facility]   PHYSICIAN:  Rene Paci, M.D. LHCDATE OF BIRTH:  06-Jan-1961   DATE OF ADMISSION:  10/25/2005  DATE OF DISCHARGE:  10/26/2005                                 DISCHARGE SUMMARY   DISCHARGE DIAGNOSIS:  1.  Atypical chest pain.  2.  Mild leukocytosis, questionable urinary tract infection.  3.  History of gastroesophageal reflux disease status post Nissen      fundoplication.   HISTORY OF PRESENT ILLNESS:  The patient is a 50 year old female who  reported becoming increasingly diaphoretic yesterday evening with nausea,  headache, and crushing chest pain which is radiating to the left arm.  She  also noted some tingling in the fingers of the left hand.  This became  progressively worse on the day of admission and is accompanied by shortness  of breath prompting her ER visit.   PAST MEDICAL HISTORY:  1.  Mild obstructive sleep apnea.  2.  Anxiety.  3.  Vocal cord dysfunction.  4.  Severe gastroesophageal reflux disease.  5.  Diabetes type 2.  6.  Morbid obesity.  7.  Hypertension.  8.  Hypercholesterolemia.  9.  Irritable bowel syndrome.   HOSPITAL COURSE:  Problem 1:  Atypical chest pain.  The patient was admitted and she underwent  serial cardiac enzymes which were negative x 4.  In addition, D-dimer was  sent which was also negative.  D-dimer was less than 0.22.  Serial cardiac  enzymes were negative.  Of note, the patient reports worsening of these  types of symptoms after large meals.  She was seen by Dr. Marina Goodell from GI  secondary to GERD and she has a history of a Nissen fundoplication in 2003.  She may need further GI evaluation and was instructed to contact Dr. Marina Goodell  for a follow up appointment.   Problem 2:  Mild leukocytosis, questionable UTI.  The  patient was noted to  have mild leukocytosis with a value of 12.7.  She was noted to have 3-6  white blood cells and trace leukocytes on urinalysis.  She will be given a  short course of Cipro for probable UTI.  Culture is still pending at the  time of this dictation.   In addition, the patient is followed by Dr. Julieanne Manson from [facility].  She is noted to have cholesterol level of 154 with elevated  triglycerides of 427 and a depressed HDL of 24.  She is instructed to also  follow up with Dr. Clarene Duke and may benefit from outpatient stress test, will  defer to cardiology.   DISCHARGE MEDICATIONS:  K-Dur 40 mEq p.o. daily, Benzoate 100 mg p.o.  b.i.d., Cipro 500 mg p.o. b.i.d. for three days, Avandia 2 mg p.o. daily,  Metformin 1000 mg p.o. daily, Glipizide 5 mg p.o. daily, Tenoretic 50/25 one  tab p.o. daily, Reglan 10 mg p.o. before meals and at bedtime, HCTZ 25 mg  p.o. daily, Norvasc 5 mg p.o. daily, Yaz 3 mg p.o. daily, Zygaret 40/1100  once daily as before, Ultram 50 mg four times daily as needed, Effexor XR 75  mg p.o. daily.   DISCHARGE LABORATORY DATA:  Hemoglobin 12.6, hematocrit 35.8, white blood  cell count 12.7, platelets 372, BUN 11, creatinine 0.8.   FOLLOW UP:  The patient is instructed to follow up with Dr. Birdie Sons in  1-2 weeks, Dr. Julieanne Manson in 1-2 weeks, and Dr. Marina Goodell from GI.  She is  instructed to call for an appointment.  The patient is instructed to call  Dr. Cato Mulligan or go to  the emergency room  should she develop worsening chest  pain or shortness of breath.      Melissa S. Peggyann Juba, NP      Rene Paci, M.D. [facility]  Electronically Signed    MSO/MEDQ  D:  10/26/2005  T:  10/26/2005  Job:  [id number]   cc:   Thereasa Solo. Little, M.D.  Fax: [phone]   Valetta Mole. Swords, M.D. [facility]  [address] [place]  [place] [address]   Wilhemina Bonito. Marina Goodell, M.D. [facility]  [address]  [place]  [place] [address]

## 2010-11-12 NOTE — Assessment & Plan Note (Signed)
Summary: Pulmonary OV   Copy to:  Adella Hare Primary Nickalaus Crooke/Referring Kinsley Nicklaus:  Leeanne Deed  CC:  2 mo follow up.  Pt states breathing is doing well overall.  c/o dry cough, pressure headache, runny nose, and and hoarse x1wk.[name]  History of Present Illness:  50 year old, white female with chronic pulmonary sarcoidosis.  This is been manifested by cyclic cough and mediastinal adenopathy with associated chest pain.  There are also stable pulmonary nodules.  The patient has been intolerant of systemic corticosteroids.  She also had adverse side effects to Arava(leflunamide).    On this basis we referred the patient in November 2008  to Dr. Azzie Roup,  for Remicade injections.  There has been a positive response to Rx.  December 18, 2008-now on pred 5/d  on remecade every 6weeks The pts cough is better.  There is some chest pain.  Pt notes some sinus pressure.  Some postnasal drip.  May 01, 2009 --Presents for an acute office visit. Admitted 04/27/2009- 04/30/2009 for  Sarcoidosis with acute exacerbation.   She was admitted w/ increased cough, dyspnea and weakness. CT chestt was     negative for PE and showed no intrathoracic abnormalities.   Sputum cx was neg. She was treated w/ steorids, aggressive pulmonary toilet. She did have a lactic acidosis.  w/ lactic acid level as high as 8, last check showed at 2.6. Felt to be from r hyperventilation at the time of admission. She did improve but cough and tightness did not totally resolve. Today she says she has been having dry cough. Discharge summary says pred 10mg  however she is on 5mg  once daily. Dyspnea is worse in heat (temps >90 outside). Denies orthopnea, hemoptysis, fever, n/v/d, edema.  Pain on inspiration and with cough.     May 07, 2009 10:03  at last ov with NP pt was seen post hosp: Stop fish oil.  Change QVAR to Symbicort 160.4.13mcg 2 puffs two times a day (brush, rinse and gargle after use) Increase Prednisone 20mg  once  daily for 1 week, then hold at 10mg  once daily until next office viist.  Mucinex DM two times a day as needed cough/congestion Still on prednisone 20mg /d.  Still with dry cough.  Still with heartburn.  Has dysphagia and hoarseness. No loose stools.  Still dyspneic at rest.   May 30, 2009 10:31 AM Still with cough that is dry.  Cough is much less than one month ago with hosp admit. To have endo on 8/24 Still with breakthrough heartburn Pt denies any significant sore throat, nasal congestion or excess secretions, fever, chills, sweats, unintended weight loss, pleurtic or exertional chest pain, orthopnea PND, or leg swelling Pt denies any increase in rescue therapy over baseline, denies waking up needing it or having any early am or nocturnal exacerbations of coughing/wheezing/or dyspnea. There are not any alleviating or precipitating factors noted.  The symptoms do not generally fluctuate.  June 10, 2009 2:50 PM At last ov we reduced pred to 15mg /d Still with pndrip. Pt now with cough is dry and occ yellow. There is some pharygeal drainage The pt is on generic flonase Dyspnea persists  August 01, 2009 12:04 PM At last ov we rx: A depomedrol injection will be given  Take Augmentin 875mg  twice daily for 7 days Use flonase twice a day two sprays each nostril for 4 days then reduce to once daily Stay on prednisone 15mg  daily diflucan for 5 days Now not as much cough,  not as dyspneic.  Some pndrip and has hoarseness  Preventive Screening-Counseling & Management  Alcohol-Tobacco     Smoking Status: never  Current Medications (verified): 1)  Atenolol-Chlorthalidone 50-25 Mg Tabs (Atenolol-Chlorthalidone) .... Take 1 Tablet By Mouth Once A Day 2)  Budeprion Xl 300 Mg Tb24 (Bupropion Hcl) .... Take 1/2 Tablet By Mouth Once A Day 3)  Effexor Xr 75 Mg Cp24 (Venlafaxine Hcl) .... Take 1 Tablet By Mouth Once A Day 4)  Lorazepam 1 Mg Tabs (Lorazepam) .... One Every 6 Hrs As Needed 5)   Novolog Flexpen 100 Unit/ml Soln (Insulin Aspart) .... Three Times A Day As Directed 6)  Onetouch Ultra Test  Strp (Glucose Blood) .... Tid 7)  Potassium Chloride Crys Cr 20 Meq Tbcr (Potassium Chloride Crys Cr) .[name].. 1 Three Times A Day 8)  Prednisone 10 Mg Tabs (Prednisone) .[name].. 1-1/2 Tab Once Daily 9)  Zegerid 40-1100 Mg Caps (Omeprazole-Sodium Bicarbonate) .... Take 1 Tablet By Mouth Once A Day 10)  Pen Needles 31g X 8 Mm  Misc (Insulin Pen Needle) .... Use As Directed 11)  Remicade 100 Mg  Solr (Infliximab) .... Injection Every 6 Weeks 12)  Fluticasone Propionate 50 Mcg/act  Susp (Fluticasone Propionate) .... 2 Sprays Each Nostril Once Daily 13)  Furosemide 20 Mg Tabs (Furosemide) .... Take One (1) By Mouth Daily 14)  Vicodin 5-500 Mg  Tabs (Hydrocodone-Acetaminophen) .... One Tab Every 6 Hours As Needed 15)  Estrace 1 Mg Tabs (Estradiol) .... Take 1 Tablet By Mouth Once A Day 16)  Restasis 0.05 % Emul (Cyclosporine) .[name].. 1 Drop Each Eye Daily 17)  Lotemax 0.5 % Susp (Loteprednol Etabonate) .[name].. 1 Drop Each Eye Every 6 Hours As Needed 18)  Utac 500-500 Mg Tabs (Methenamine Mand-Sod Phosphate) .... Two Times A Day  Allergies (verified): 1)  ! Methotrexate  Past History:  Past medical, surgical, family and social histories (including risk factors) reviewed, and no changes noted (except as noted below).  Past Medical History: Reviewed history from 04/21/2009 and no changes required. EDEMA (ICD-782.3) HEADACHE (ICD-784.0) MENOPAUSAL SYNDROME (ICD-627.2) UTI (ICD-599.0) SARCOIDOSIS (ICD-135)   -CT Chest 6/08 improvement in Mediastinal LAN   -11/08 start of remecade Rx per [facility] PULMONARY NODULE (ICD-518.89)    -CT Chest 6/08 reduction in size of nodules due to Arava and Pred RX     -probable granulomas due to Sarcoidosis HYPERTENSION (ICD-401.9) HYPERLIPIDEMIA (ICD-272.4) GERD (ICD-530.81) Sleep APnea DIABETES MELLITUS, TYPE II (ICD-250.00) ANEMIA-NOS  (ICD-285.9)    Past Surgical History: Reviewed history from 08/21/2006 and no changes required. Ventral Hernia Repair Nissen Fundoplication Cholecystectomy Hysterectomy  Family History: Reviewed history from 05/12/2009 and no changes required. father-alive MI, DM mother alive and well uncle with SARCOID No FH of Colon Cancer:  Social History: Reviewed history from 05/12/2009 and no changes required. Married Never Smoked Regular exercise-no Alcohol use-no Illicit Drug Use - no  Review of Systems       The patient complains of shortness of breath with activity, non-productive cough, and nasal congestion/difficulty breathing through nose.  The patient denies shortness of breath at rest, productive cough, coughing up blood, chest pain, irregular heartbeats, acid heartburn, indigestion, loss of appetite, weight change, abdominal pain, difficulty swallowing, sore throat, tooth/dental problems, headaches, sneezing, itching, ear ache, anxiety, depression, hand/feet swelling, joint stiffness or pain, rash, change in color of mucus, and fever.    Vital Signs:  Patient profile:   50 year old female Height:      66 inches Weight:  279.38 pounds BMI:     45.26 O2 Sat:      97 % on Room air Temp:     97.8 degrees F oral Pulse rate:   84 / minute BP sitting:   118 / 74  (left arm) Cuff size:   large  Vitals Entered By: Gweneth Dimitri RN (August 01, 2009 11:55 AM)  O2 Flow:  Room air CC: 2 mo follow up.  Pt states breathing is doing well overall.  c/o dry cough, pressure headache, runny nose, and hoarse x1wk. Comments Medications reviewed with patient Gweneth Dimitri RN  August 01, 2009 11:58 AM    Physical Exam  Additional Exam:   GEN: A/Ox3; pleasant , NAD, obese female.  HEENT:  [place]/AT, , EACs-clear, TMs-wnl, NOSE-purulence in nares,  nasal edema THROAT-clear NECK:  Supple w/ fair ROM; no JVD; normal carotid impulses w/o bruits; no thyromegaly or nodules palpated; no  lymphadenopathy. RESP  Coarse BS w/ no wheeizng, has some mild upper airway psuedowheezing on forced exp.  CARD:  RRR, no m/r/g   GI:   Soft & nt; nml bowel sounds; no organomegaly or masses detected. [place]: Warm bil,  no calf tenderness edema, clubbing, pulses intact Neuro: intact w/ no focal deficits.    Impression & Recommendations:  Problem # 1:  SARCOIDOSIS (ICD-135) Assessment Unchanged Stable sarcoidosis plan continue remecade rx taper pred to 10mg /d  Problem # 2:  GERD (ICD-530.81) Assessment: Unchanged Severe GERD that ppts cough plan  cont zegerid daily and reflux diet Her updated medication list for this problem includes:    Zegerid 40-1100 Mg Caps (Omeprazole-sodium bicarbonate) .[name]... Take 1 tablet by mouth once a day  Medications Added to Medication List This Visit: 1)  Prednisone 10 Mg Tabs (Prednisone) .[name].. 1-1/2 tab once daily 2)  Prednisone 10 Mg Tabs (Prednisone) .[name].. 1  once daily  Complete Medication List: 1)  Atenolol-chlorthalidone 50-25 Mg Tabs (Atenolol-chlorthalidone) .... Take 1 tablet by mouth once a day 2)  Budeprion Xl 300 Mg Tb24 (Bupropion hcl) .... Take 1/2 tablet by mouth once a day 3)  Effexor Xr 75 Mg Cp24 (Venlafaxine hcl) .... Take 1 tablet by mouth once a day 4)  Lorazepam 1 Mg Tabs (Lorazepam) .... One every 6 hrs as needed 5)  Novolog Flexpen 100 Unit/ml Soln (Insulin aspart) .... Three times a day as directed 6)  Onetouch Ultra Test Strp (Glucose blood) .... Tid 7)  Potassium Chloride Crys Cr 20 Meq Tbcr (Potassium chloride crys cr) .[name].. 1 three times a day 8)  Prednisone 10 Mg Tabs (Prednisone) .[name].. 1  once daily 9)  Zegerid 40-1100 Mg Caps (Omeprazole-sodium bicarbonate) .... Take 1 tablet by mouth once a day 10)  Pen Needles 31g X 8 Mm Misc (Insulin pen needle) .... Use as directed 11)  Remicade 100 Mg Solr (Infliximab) .... Injection every 6 weeks 12)  Fluticasone Propionate 50 Mcg/act Susp (Fluticasone propionate) .... 2 sprays  each nostril once daily 13)  Furosemide 20 Mg Tabs (Furosemide) .... Take one (1) by mouth daily 14)  Vicodin 5-500 Mg Tabs (Hydrocodone-acetaminophen) .... One tab every 6 hours as needed 15)  Estrace 1 Mg Tabs (Estradiol) .... Take 1 tablet by mouth once a day 16)  Restasis 0.05 % Emul (Cyclosporine) .[name].. 1 drop each eye daily 17)  Lotemax 0.5 % Susp (Loteprednol etabonate) .[name].. 1 drop each eye every 6 hours as needed 18)  Utac 500-500 Mg Tabs (Methenamine mand-sod phosphate) .... Two times a day  Patient  Instructions: 1)  Reduce prednisone to one daily 2)  Return one month

## 2010-11-12 NOTE — Miscellaneous (Signed)
Summary: Initial Summary for PT Services/[facility]  Initial Summary for PT Services/[facility]   Imported By: Maryln Gottron 10/24/2009 10:39:18  _____________________________________________________________________  External Attachment:    Type:   Image     Comment:   External Document

## 2010-11-12 NOTE — Assessment & Plan Note (Signed)
Summary: Non cardiac chest pain    History of Present Illness Visit Type: Initial Visit Primary GI MD: Yancey Flemings MD Primary Provider: Birdie Sons, MD  Requesting Provider: n/a Chief Complaint: chest pain non-cardiac, since Oct. 5, 2011 History of Present Illness:   50 year old female with multiple significant medical problems including morbid obesity, hypertension, diabetes mellitus, hyperlipidemia, pulmonary sarcoid, chronic atypical chest pain, recurrent problems with bronchospasm and respiratory failure, and GERD for which she is status post fundoplication. I have seen her on multiple occasions regarding GERD, atypical chest pain, and other complaints. She is accompanied by her mother. She said today regarding noncardiac chest pain, question GI cause. This problem began October 5. She describes intense sharp chest pain that lasts approximately 15-20 minutes. This occurs 4-5 times daily. This can awaken her from sleep. There is associated tingling in the arms and fingers bilaterally. The discomfort is not affected by deep breathing, meals, or movement. She has recently had her PPI therapy altered. She's not sure if this helps. She does describe "some reflux". Her last upper endoscopy was performed in February 2011. This was a normal examination post fundoplication. She did undergo him. Dilation with a Maloney dilator for complaints of dysphagia. She still has complaints of dysphagia. Earlier today she was switched from Dexilant 60 mg daily to Zegerid 40 mg b.i.d. She is on immunosuppressant therapy in the form of Remicade and prednisone. She was recently seen by her pulmonologist, Dr. Delford Field who did not feel that her symptoms were related to her pulmonary disease. She was also hospitalized and underwent cardiac catheterization which was negative. She was told by her cardiologist that he felt her pain was GI in origin. She denies any pain with swallowing. Pain pills (OxyContin) help.[name]   GI Review of  Systems    Reports abdominal pain, acid reflux, chest pain, and  dysphagia with solids.     Location of  Abdominal pain: epigastric area.    Denies belching, bloating, dysphagia with liquids, heartburn, loss of appetite, nausea, vomiting, vomiting blood, weight loss, and  weight gain.        Denies anal fissure, black tarry stools, change in bowel habit, constipation, diarrhea, diverticulosis, fecal incontinence, heme positive stool, hemorrhoids, irritable bowel syndrome, jaundice, light color stool, liver problems, rectal bleeding, and  rectal pain.    Current Medications (verified): 1)  Atenolol-Chlorthalidone 50-25 Mg Tabs (Atenolol-Chlorthalidone) .... Take 1 Tablet By Mouth Once A Day 2)  Effexor Xr 75 Mg Cp24 (Venlafaxine Hcl) .... Take 1 Tablet By Mouth Once A Day 3)  Lorazepam 1 Mg Tabs (Lorazepam) .... One Every 6 Hrs As Needed 4)  Remicade 100 Mg  Solr (Infliximab) .... Injection Every 6 Weeks 5)  Omnaris 50 Mcg/act Susp (Ciclesonide) .... One Spray Each Nostril Once Daily 6)  Estrace 1 Mg Tabs (Estradiol) .... Take 1 Tablet By Mouth Once A Day 7)  Restasis 0.05 % Emul (Cyclosporine) .[name].. 1 Drop Each Eye Daily 8)  Lotemax 0.5 % Susp (Loteprednol Etabonate) .[name].. 1 Drop Each Eye Every 6 Hours As Needed 9)  Simvastatin 40 Mg Tabs (Simvastatin) .... Take One Tablet At Bedtime Start One Week After Completing Diflucan 10)  Levbid 0.375 Mg Xr12h-Tab (Hyoscyamine Sulfate) .[name].. 1 By Mouth Two Times A Day As Needed 11)  Prednisone 5 Mg Tabs (Prednisone) .... Take 1 Tablet By Mouth Every Other Day 12)  Cpap 7 Cm 13)  Nystatin-Triamcinolone 100000-0.1 Unit/gm-% Crea (Nystatin-Triamcinolone) .... Apply Two Times A Day To Affected Area As  Needed 14)  Furosemide 20 Mg Tabs (Furosemide) .... Once Daily 15)  Klor-Con M20 20 Meq Cr-Tabs (Potassium Chloride Crys Cr) .... Take 3 Tablet By Mouth Once A Day 16)  Budeprion Xl 300 Mg Xr24h-Tab (Bupropion Hcl) .... Take 1 Tablet By Mouth Once A Day 17)   Tussicaps 10-8 Mg Xr12h-Cap (Hydrocod Polst-Chlorphen Polst) .... One By Mouth Two Times A Day As Needed Cough 18)  Benzonatate 100 Mg Caps (Benzonatate) .... Take One Every 4hours As Needed For Cough 19)  Dexilant 60 Mg Cpdr (Dexlansoprazole) .... One By Mouth Daily Failed Omeprazole Severe Reflux Disease  Allergies (verified): 1)  ! Methotrexate  Past History:  Past Medical History: Reviewed history from 08/04/2010 and no changes required.  SARCOIDOSIS (ICD-135)   -CT Chest 6/08 improvement in Mediastinal LAN   -11/08 start of remecade Rx per [facility] PULMONARY NODULE (ICD-518.89)    -CT Chest 6/08 reduction in size of nodules due to Arava and Pred RX     -probable granulomas due to Sarcoidosis HYPERTENSION (ICD-401.9) HYPERLIPIDEMIA (ICD-272.4) GERD (ICD-530.81) Sleep APnea DIABETES MELLITUS, TYPE II (ICD-250.00) ANEMIA-NOS (ICD-285.9)  Fundic Gland Polyp Atypical Chest pain    -10/11 cardiac cath neg for CAD  Past Surgical History: Reviewed history from 05/21/2010 and no changes required. Ventral Hernia Repair Nissen Fundoplication Cholecystectomy Hysterectomy right knee arthroscopy May 11, 2010  Family History: Reviewed history from 10/20/2009 and no changes required. father-alive MI, DM mother alive and well uncle with SARCOID No FH of Colon Cancer: Family History Emphysema-Mother, Father  Family History Asthma-Asthma Family History COPD -mother  Social History: Reviewed history from 10/20/2009 and no changes required. Married, live with husband Never Smoked Regular exercise-no Alcohol use-no Illicit Drug Use - no  Review of Systems       The patient complains of muscle pains/cramps, sleeping problems, thirst - excessive, and voice change.  The patient denies allergy/sinus, anemia, anxiety-new, arthritis/joint pain, back pain, blood in urine, breast changes/lumps, change in vision, confusion, cough, coughing up blood, depression-new,  fainting, fatigue, fever, headaches-new, hearing problems, heart murmur, heart rhythm changes, itching, menstrual pain, night sweats, nosebleeds, pregnancy symptoms, shortness of breath, skin rash, sore throat, swelling of feet/legs, swollen lymph glands, thirst - excessive , urination - excessive , urination changes/pain, urine leakage, and vision changes.    Vital Signs:  Patient profile:   50 year old female Menstrual status:  hysterectomy Height:      66 inches Weight:      244.50 pounds BMI:     39.61 Pulse rate:   88 / minute Pulse rhythm:   regular BP sitting:   110 / 68  (left arm) Cuff size:   large  Vitals Entered By: June McMurray CMA Duncan Dull) (August 10, 2010 2:35 PM)  Physical Exam  General:  Well developed, obese,well nourished, no acute distress. Head:  Normocephalic and atraumatic. Eyes:  PERRLA, no icterus. Mouth:  No deformity or lesions. Neck:  thick Lungs:  Clear throughout to auscultation. Heart:  Regular rate and rhythm; no murmurs, rubs,  or bruits. Abdomen:  Soft,obese, nontender and nondistended. No masses, hepatosplenomegaly or hernias noted. Normal bowel sounds. Msk:  normal posture Pulses:  Normal pulses noted. Extremities:  no edema Neurologic:  alert and oriented Skin:  Intact without significant lesions or rashes. Psych:  Alert and cooperative. Seems a bit anxious possibly depressed.   Impression & Recommendations:  Problem # 1:  CHEST PAIN UNSPECIFIED (ICD-786.50) atypical chest pain. Has a history of chronic atypical  chest pain. Not at all clear to me that this is GI in origin. Normal EGD in February. However, given the severe nature of her pain and the fact that she is on immunosuppressants, we should rule out infectious esophagitis is a possibility. Thus we'll set her up for an EGD. The nature of the procedure as well as the risks, benefits, and alternatives were reviewed. She understands and agrees to proceed. If this examination is negative,  then I would recommend investigating other possibilities such as musculoskeletal pain or functional pain.  Other Orders: EGD (EGD)  Patient Instructions: 1)  Copy sent to : Birdie Sons, M.D., Shan Levans, M.D.,Jonathan Allyson Sabal M.D. 2)  EGD [facility] 08/11/10 10:30 am arrive at 9:30 am 3)  Upper Endoscopy brochure given.  4)  The medication list was reviewed and reconciled.  All changed / newly prescribed medications were explained.  A complete medication list was provided to the patient / caregiver.

## 2010-11-12 NOTE — Assessment & Plan Note (Signed)
Summary: FOLLOWUP (abdominal discomfort, loose stools) -saw Gunnar Fusi 09-18-09    History of Present Illness Visit Type: Follow-up Visit Primary GI MD: Yancey Flemings MD Primary Provider: Birdie Sons, MD  Requesting Provider: n/a Chief Complaint: abdominal pain umbilical area, urgency, loose stools.also brb on tissue when wiping History of Present Illness:   50 year old female with multiple significant medical problems including morbid obesity, hypertension, diabetes mellitus, hyperlipidemia, pulmonary sarcoid, chronic atypical chest pain, recurrent problems with bronchospasm and respiratory failure, and GERD for which she is status post fundoplication. She last underwent upper endoscopy in August of 2010. This revealed an intact fundoplication and fundic gland polyps. The examination was performed to evaluate GERD and dysphagia, as well as recurrent pulmonary symptoms. She was subsequently seen in the office September 18, 2009 abdominal pain and loose stools.. She reports this to be a new problem. She describes postprandial abdominal cramping followed by urgency and loose stools. Defecation improves abdominal cramping discomfort. Aside from a minor amount of red blood on the tissue once, no bleeding. She denies upper abdominal pain or vomiting. Laboratories were remarkable for a mild elevation of white blood cell count (on prednisone) and potassium 2.9 and albumin 3.4. Liver function tests and lipase were normal. Recent CT scan pregnancies reviewed) for abdominal pain was negative.She was treated with 2 courses of metronidazole which she thinks has been helpful. No nocturnal symptoms. A colonoscopy was performed in May of 2006 for rectal bleeding. Examination was entirely normal. Patient has been off and on antibiotics for respiratory problems. As well, recent antibiotics for cellulitis. No antibiotics at this time. She has also been on magnesium oxide. She is accompanied by her mother.   GI Review of Systems    Reports abdominal pain and  acid reflux.     Location of  Abdominal pain: lower abdomen.    Denies belching, bloating, chest pain, dysphagia with liquids, dysphagia with solids, heartburn, loss of appetite, nausea, vomiting, vomiting blood, weight loss, and  weight gain.      Reports change in bowel habits, diarrhea, and  rectal bleeding.     Denies anal fissure, black tarry stools, constipation, diverticulosis, fecal incontinence, heme positive stool, hemorrhoids, irritable bowel syndrome, jaundice, light color stool, liver problems, and  rectal pain.    Current Medications (verified): 1)  Atenolol-Chlorthalidone 50-25 Mg Tabs (Atenolol-Chlorthalidone) .... Take 1 Tablet By Mouth Once A Day 2)  Effexor Xr 75 Mg Cp24 (Venlafaxine Hcl) .... Take 1 Tablet By Mouth Once A Day 3)  Lorazepam 1 Mg Tabs (Lorazepam) .... One Every 6 Hrs As Needed 4)  Novolog Flexpen 100 Unit/ml Soln (Insulin Aspart) .... Three Times A Day As Directed 5)  Onetouch Ultra Test  Strp (Glucose Blood) .... Tid 6)  Potassium Chloride Crys Cr 20 Meq Tbcr (Potassium Chloride Crys Cr) .[name].. 1 Three Times A Day 7)  Prednisone 10 Mg Tabs (Prednisone) .... Take As Directed. 8)  Zegerid 40-1100 Mg Caps (Omeprazole-Sodium Bicarbonate) .... Take 1 Tablet By Mouth Once A Day 9)  Pen Needles 31g X 8 Mm  Misc (Insulin Pen Needle) .... Use As Directed 10)  Remicade 100 Mg  Solr (Infliximab) .... Injection Every 6 Weeks 11)  Fluticasone Propionate 50 Mcg/act  Susp (Fluticasone Propionate) .... 2 Sprays Each Nostril Once Daily 12)  Furosemide 20 Mg Tabs (Furosemide) .... Take One (1) By Mouth Daily 13)  Vicodin 5-500 Mg  Tabs (Hydrocodone-Acetaminophen) .... One Tab Every 6 Hours As Needed 14)  Estrace 1 Mg Tabs (Estradiol) .Marland KitchenMarland KitchenMarland Kitchen  Take 1 Tablet By Mouth Once A Day 15)  Restasis 0.05 % Emul (Cyclosporine) .[name].. 1 Drop Each Eye Daily 16)  Lotemax 0.5 % Susp (Loteprednol Etabonate) .[name].. 1 Drop Each Eye Every 6 Hours As Needed 17)  Tussionex  Pennkinetic Er 8-10 Mg/34ml Lqcr (Chlorpheniramine-Hydrocodone) .[name].. 1 Tsp Every 12 Hr As Needed For Cough, May Make You Sleepy 18)  Simvastatin 40 Mg Tabs (Simvastatin) .... Take One Tablet At Bedtime Start One Week After Completing Diflucan 19)  Magnesium Oxide 250 Mg Tabs (Magnesium Oxide) .... One Tablet By Mouth Once Daily 20)  Flagyl 500 Mg Tabs (Metronidazole) .... Take 1 Tab 3 Times Daily X 10 Days  Allergies (verified): 1)  ! Methotrexate  Past History:  Past Medical History: Reviewed history from 10/13/2009 and no changes required. EDEMA (ICD-782.3) HEADACHE (ICD-784.0) MENOPAUSAL SYNDROME (ICD-627.2) UTI (ICD-599.0) SARCOIDOSIS (ICD-135)   -CT Chest 6/08 improvement in Mediastinal LAN   -11/08 start of remecade Rx per [facility] PULMONARY NODULE (ICD-518.89)    -CT Chest 6/08 reduction in size of nodules due to Arava and Pred RX     -probable granulomas due to Sarcoidosis HYPERTENSION (ICD-401.9) HYPERLIPIDEMIA (ICD-272.4) GERD (ICD-530.81) Sleep APnea DIABETES MELLITUS, TYPE II (ICD-250.00) ANEMIA-NOS (ICD-285.9)  Fundic Gland Polyp  Past Surgical History: Reviewed history from 08/21/2006 and no changes required. Ventral Hernia Repair Nissen Fundoplication Cholecystectomy Hysterectomy  Family History: Reviewed history from 05/12/2009 and no changes required. father-alive MI, DM mother alive and well uncle with SARCOID No FH of Colon Cancer:  Social History: Reviewed history from 05/12/2009 and no changes required. Married Never Smoked Regular exercise-no Alcohol use-no Illicit Drug Use - no  Review of Systems       shortness of breath, foot infection, joint aches. Anxiety/depression. All other systems reviewed and reported as negative.  Vital Signs:  Patient profile:   50 year old female Height:      66 inches Weight:      273 pounds BMI:     44.22 Pulse rate:   80 / minute Pulse rhythm:   regular BP sitting:   134 / 82  (right  arm) Cuff size:   large  Vitals Entered By: June McMurray CMA Duncan Dull) (October 14, 2009 9:57 AM)  Physical Exam  General:  OBESE,Well developed, well nourished, no acute distress. Head:  Normocephalic and atraumatic. Eyes:  PERRLA, no icterus. Nose:  No deformity, discharge,  or lesions. Mouth:  No deformity or lesions,  Neck:  thick neck Lungs:  Clear throughout to auscultation. Heart:  Regular rate and rhythm; no murmurs, rubs,  or bruits. Abdomen:  obese,Soft, nontender and nondistended. No masses, hepatosplenomegaly or hernias noted. Normal bowel sounds. Msk:  Symmetrical with no gross deformities. Normal posture. Pulses:  Normal pulses noted. Extremities:  No clubbing, cyanosis, edema or deformities noted. Neurologic:  Alert and  oriented x4;  grossly normal neurologically. Skin:  Intact without significant lesions or rashes. Psych:  Alert and cooperative. Normal mood and affect.   Impression & Recommendations:  Problem # 1:  DIARRHEA (ICD-787.91) New-onset diarrhea occurring postprandially and associated with abdominal cramping discomfort (789.07). Suspect an acute gastroenteritis with current symptoms suggesting postinfectious irritable bowel syndrome. Negative stool studies. 2 courses of metronidazole.  Plan:. #1. Prescribe probiotic align one daily for 3 weeks. Samples given. #2. Prescribed Levbid 0.375 mg p.o. b.i.d. p.r.n. #3. If symptoms persist, I would recommend holding magnesium oxide. Beyond that, we could consider colonoscopy with biopsies. However, I would not recommend this  at the present time. #4. Differential followup p.r.n.  Problem # 2:  RECTAL BLEEDING (ICD-569.3) minor rectal bleeding on one occasion. Entirely negative colonoscopy in 2006. Suspect benign anorectal pathology. Recommend observation at the current time  Problem # 3:  GERD (ICD-530.81) status post fundoplication. No classic symptoms on medication. Recent endoscopy as discussed. Continue  current medications for GERD. Reflux precautions with attention to weight loss stressed.  Patient Instructions: 1)  Levbid 0.375 mg 1 by mouth two times a day as needed 2)  # 60 x 6 RFs. 3)  Align samples given. 4)  Please schedule a follow-up appointment as needed.  5)  The medication list was reviewed and reconciled.  All changed / newly prescribed medications were explained.  A complete medication list was provided to the patient / caregiver. 6)  copy: Dr. Birdie Sons Prescriptions: LEVBID 0.375 MG XR12H-TAB (HYOSCYAMINE SULFATE) 1 by mouth two times a day as needed  #60 x 6   Entered by:   [name] NCMA   Authorized by:   Hilarie Fredrickson MD   Signed by:   [name] NCMA on 10/14/2009   Method used:   Electronically to        [facility] (retail)       [address]       [place], [place]  [address]       Ph: [phone]       Fax: [phone]   RxID:   [id number]

## 2010-11-12 NOTE — Progress Notes (Signed)
Summary: missing Rx  diclofenac   Phone Note Call from Patient Call back at Home Phone [phone]   Caller: Spouse Call For: Dr Scotty Court Summary of Call: was seen yesterday, med not sent to pharmacy- supposed to be Diclofenac, [facility] Initial call taken by: [name], RN,  December 18, 2009 3:10 PM  Follow-up for Phone Call        ok just sent thanks  Follow-up by: [name], RN,  December 18, 2009 3:15 PM    New/Updated Medications: DICLOFENAC SODIUM 75 MG TBEC (DICLOFENAC SODIUM) take 1 tablet by mouth two times a day after meals. DICLOFENAC SODIUM 75 MG TBEC (DICLOFENAC SODIUM) 1 by mouth two times a day after meals Prescriptions: DICLOFENAC SODIUM 75 MG TBEC (DICLOFENAC SODIUM) 1 by mouth two times a day after meals  #60 x 6   Entered by:   [name], RN   Authorized by:   [name] MD   Signed by:   [name], RN on 12/18/2009   Method used:   Electronically to        [name] ([facility])       [address]       [place], [place]  [address]       Ph: [phone]       Fax: [phone]   RxID:   [id number] DICLOFENAC SODIUM 75 MG TBEC (DICLOFENAC SODIUM) take 1 tablet by mouth two times a day after meals.  #60 x 3   Entered by:   [name], RN   Authorized by:   [name] MD   Signed by:   [name], RN on 12/18/2009   Method used:   Electronically to        [facility] (retail)       [address]       [place], [place]  [address]       Ph: [phone]       Fax: [phone]   RxID:   [id number]    [facility] ..gh rn.........[name]

## 2010-11-12 NOTE — Assessment & Plan Note (Signed)
Summary: 1 mnth rov//slm/pt rsc/cjr   Vital Signs:  Patient profile:   50 year old female Menstrual status:  hysterectomy Weight:      243 pounds Temp:     97.7 degrees F oral BP sitting:   124 / 74  (left arm) Cuff size:   large  Vitals Entered By: Alfred Levins, CMA (August 20, 2010 8:41 AM) CC: 1wk f/u   Primary Care Provider:  Birdie Sons, MD   CC:  1wk f/u.  History of Present Illness:  Follow-Up Visit      [name] who presents for Follow-up visit.  The patient denies chest pain and palpitations.  Since the last visit the patient notes no new problems or concerns.  The patient reports taking meds as prescribed.  When questioned about possible medication side effects, the patient notes none.    All other systems reviewed and were negative except for chronic fatigue, chronic, intermittent cough.  Current Problems (verified): 1)  Gout  (ICD-274.9) 2)  Anemia-unspecified  (ICD-285.9) 3)  Obesity  (ICD-278.00) 4)  Sleep Apnea  (ICD-780.57) 5)  Sarcoidosis  (ICD-135) 6)  Hypertension  (ICD-401.9) 7)  Hyperlipidemia  (ICD-272.4) 8)  Gerd  (ICD-530.81) 9)  Diabetes Mellitus, Type II  (ICD-250.00)  Current Medications (verified): 1)  Atenolol-Chlorthalidone 50-25 Mg Tabs (Atenolol-Chlorthalidone) .... Take 1 Tablet By Mouth Once A Day 2)  Effexor Xr 75 Mg Cp24 (Venlafaxine Hcl) .... Take 1 Tablet By Mouth Once A Day 3)  Lorazepam 1 Mg Tabs (Lorazepam) .... One Every 6 Hrs As Needed 4)  Remicade 100 Mg  Solr (Infliximab) .... Injection Every 6 Weeks 5)  Omnaris 50 Mcg/act Susp (Ciclesonide) .... One Spray Each Nostril Once Daily 6)  Estrace 1 Mg Tabs (Estradiol) .... Take 1 Tablet By Mouth Once A Day 7)  Restasis 0.05 % Emul (Cyclosporine) .[name].. 1 Drop Each Eye Daily 8)  Lotemax 0.5 % Susp (Loteprednol Etabonate) .[name].. 1 Drop Each Eye Every 6 Hours As Needed 9)  Simvastatin 40 Mg Tabs (Simvastatin) .... Take One Tablet At Bedtime Start One Week After  Completing Diflucan 10)  Levbid 0.375 Mg Xr12h-Tab (Hyoscyamine Sulfate) .[name].. 1 By Mouth Two Times A Day As Needed 11)  Prednisone 5 Mg Tabs (Prednisone) .... Take 1 Tablet By Mouth Every Other Day 12)  Cpap 7 Cm 13)  Nystatin-Triamcinolone 100000-0.1 Unit/gm-% Crea (Nystatin-Triamcinolone) .... Apply Two Times A Day To Affected Area As Needed 14)  Furosemide 20 Mg Tabs (Furosemide) .... Once Daily 15)  Klor-Con M20 20 Meq Cr-Tabs (Potassium Chloride Crys Cr) .... Take 3 Tablet By Mouth Once A Day 16)  Budeprion Xl 300 Mg Xr24h-Tab (Bupropion Hcl) .... Take 1 Tablet By Mouth Once A Day 17)  Tussicaps 10-8 Mg Xr12h-Cap (Hydrocod Polst-Chlorphen Polst) .... One By Mouth Two Times A Day As Needed Cough 18)  Benzonatate 100 Mg Caps (Benzonatate) .... Take One Every 4hours As Needed For Cough 19)  Zegerid 40-1100 Mg Caps (Omeprazole-Sodium Bicarbonate) .... Take 1 Tablet By Mouth Two Times A Day  Allergies (verified): 1)  ! Methotrexate  Past History:  Past Surgical History: Last updated: 05/21/2010 Ventral Hernia Repair Nissen Fundoplication Cholecystectomy Hysterectomy right knee arthroscopy May 11, 2010  Family History: Last updated: 10/20/2009 father-alive MI, DM mother alive and well uncle with SARCOID No FH of Colon Cancer: Family History Emphysema-Mother, Father  Family History Asthma-Asthma Family History COPD -mother  Social History: Last updated: 10/20/2009 Married, live with husband Never Smoked  Regular exercise-no Alcohol use-no Illicit Drug Use - no  Risk Factors: Exercise: no (05/31/2007)  Risk Factors: Smoking Status: never (06/17/2010)  Past Medical History: SARCOIDOSIS (ICD-135)   -CT Chest 6/08 improvement in Mediastinal LAN   -11/08 start of remecade Rx per [facility] PULMONARY NODULE (ICD-518.89)    -CT Chest 6/08 reduction in size of nodules due to Arava and Pred RX     -probable granulomas due to Sarcoidosis HYPERTENSION  (ICD-401.9) HYPERLIPIDEMIA (ICD-272.4) GERD (ICD-530.81) Sleep APnea DIABETES MELLITUS, TYPE II (ICD-250.00) ANEMIA-NOS (ICD-285.9)  Fundic Gland Polyp Atypical Chest pain    -10/11 cardiac cath neg for CAD Gout  Physical Exam  General:  overweight female in no acute distress. HEENT exam atraumatic, normocephalic symmetric her muscles are intact. Neck is supple. Chest heart auscultation. Cardiac exam S1-S2 are regular. Abdominal examination sounds, soft. Overweight. Extremities no clubbing cyanosis or edema. she does have some erythema over the right great toe.   Impression & Recommendations:  Problem # 1:  SARCOIDOSIS (ICD-135) followed by Dr. Delford Field.  Problem # 2:  HYPERTENSION (ICD-401.9) controlled. Continue current medications. Her updated medication list for this problem includes:    Atenolol-chlorthalidone 50-25 Mg Tabs (Atenolol-chlorthalidone) .[name]... Take 1 tablet by mouth once a day    Furosemide 20 Mg Tabs (Furosemide) ..... Once daily  BP today: 124/74 Prior BP: 110/68 (08/10/2010)  Prior 10 Yr Risk Heart Disease: 8 % (09/21/2006)  Labs Reviewed: K+: 3.8 (08/04/2010) Creat: : 0.7 (08/04/2010)   Chol: 212 (07/23/2010)   HDL: 46.60 (07/23/2010)   LDL: DEL (09/30/2008)   TG: 491.0 (07/23/2010)  Problem # 3:  HYPERLIPIDEMIA (ICD-272.4) controlled continue current medications  Her updated medication list for this problem includes:    Simvastatin 40 Mg Tabs (Simvastatin) .[name]... Take one tablet at bedtime start one week after completing diflucan  Labs Reviewed: SGOT: 17 (06/03/2010)   SGPT: 14 (06/03/2010)  Prior 10 Yr Risk Heart Disease: 8 % (09/21/2006)   HDL:46.60 (07/23/2010), 41.70 (04/15/2010)  LDL:DEL (09/30/2008), DEL (06/05/2008)  Chol:212 (07/23/2010), 190 (04/15/2010)  Trig:491.0 (07/23/2010), 333.0 (04/15/2010)  Problem # 4:  GOUT (ICD-274.9) she has had some left foot pain---treated for infection by orth-- there is some swelling over 5th mtp joint and  metatatarsals--? infectios vs gout.  Currently on keflex--- change to doxycycline and increase prednisone to 20 mg for 5 days  Complete Medication List: 1)  Atenolol-chlorthalidone 50-25 Mg Tabs (Atenolol-chlorthalidone) .... Take 1 tablet by mouth once a day 2)  Effexor Xr 75 Mg Cp24 (Venlafaxine hcl) .... Take 1 tablet by mouth once a day 3)  Lorazepam 1 Mg Tabs (Lorazepam) .... One every 6 hrs as needed 4)  Remicade 100 Mg Solr (Infliximab) .... Injection every 6 weeks 5)  Omnaris 50 Mcg/act Susp (Ciclesonide) .... One spray each nostril once daily 6)  Estrace 1 Mg Tabs (Estradiol) .... Take 1 tablet by mouth once a day 7)  Restasis 0.05 % Emul (Cyclosporine) .[name].. 1 drop each eye daily 8)  Lotemax 0.5 % Susp (Loteprednol etabonate) .[name].. 1 drop each eye every 6 hours as needed 9)  Simvastatin 40 Mg Tabs (Simvastatin) .... Take one tablet at bedtime start one week after completing diflucan 10)  Levbid 0.375 Mg Xr12h-tab (Hyoscyamine sulfate) .[name].. 1 by mouth two times a day as needed 11)  Prednisone 5 Mg Tabs (Prednisone) .... Take 1 tablet by mouth every other day 12)  Cpap 7 Cm  13)  Nystatin-triamcinolone 100000-0.1 Unit/gm-% Crea (Nystatin-triamcinolone) .... Apply two times  a day to affected area as needed 14)  Furosemide 20 Mg Tabs (Furosemide) .... Once daily 15)  Klor-con M20 20 Meq Cr-tabs (Potassium chloride crys cr) .... Take 3 tablet by mouth once a day 16)  Budeprion Xl 300 Mg Xr24h-tab (Bupropion hcl) .... Take 1 tablet by mouth once a day 17)  Tussicaps 10-8 Mg Xr12h-cap (Hydrocod polst-chlorphen polst) .... One by mouth two times a day as needed cough 18)  Benzonatate 100 Mg Caps (Benzonatate) .... Take one every 4hours as needed for cough 19)  Zegerid 40-1100 Mg Caps (Omeprazole-sodium bicarbonate) .... Take 1 tablet by mouth two times a day 20)  Diflucan 100 Mg Tabs (Fluconazole) .... Take one now  Patient Instructions: 1)  increase prednisone t o 20 mg by mouth once  daily for 5 days.  2)  Please schedule a follow-up appointment in 2 months. 3)  labs one week prior to visit 4)  lipids---272.4 5)  lfts-995.2 6)  bmet-995.2 7)  A1C-250.02 8)   cbc diff  Prescriptions: DOXYCYCLINE HYCLATE 100 MG CAPS (DOXYCYCLINE HYCLATE) Take 1 tab twice a day  #14 x 0   Entered and Authorized by:   Birdie Sons MD   Signed by:   Birdie Sons MD on 08/20/2010   Method used:   Electronically to        [facility] (retail)       [address]       [place], [place]  [address]       Ph: [phone]       Fax: [phone]   RxID:   [id number]    Orders Added: 1)  Est. Patient Level IV [87564]

## 2010-11-12 NOTE — Progress Notes (Signed)
Summary: headache & sinus pressure headache not better  Phone Note Call from Patient Call back at [phone] her cell   Caller: husband,[phone] bill at work Call For: swords Summary of Call: Saw Dr. Armanda Magic last week for headache & sinus congestion & pressure.  Not better.  What to do?  [facility]ses [facility].  Let us know which he sends to.   Initial call taken by: Rudy Jew, RN,  November 10, 2009 12:34 PM  Follow-up for Phone Call        CT sinuses indication chronic sinusitis Follow-up by: Birdie Sons MD,  November 10, 2009 12:36 PM  Additional Follow-up for Phone Call Additional follow up Details #1::        Phone Call Completed Additional Follow-up by: Rudy Jew, RN,  November 10, 2009 1:59 PM  New Problems: SINUSITIS, CHRONIC (ICD-473.9)   New Problems: SINUSITIS, CHRONIC (ICD-473.9)

## 2010-11-12 NOTE — Assessment & Plan Note (Signed)
Summary: AB PAIN//CCM   Vital Signs:  Patient profile:   50 year old female Weight:      273 pounds Temp:     97.9 degrees F Pulse rate:   66 / minute Resp:     24 per minute BP sitting:   138 / 86  (left arm) Cuff size:   large  Vitals Entered By: Gladis Riffle, RN (June 25, 2009 9:17 AM)  Primary Care Provider:  Leeanne Deed  CC:  Abdominal pain.  History of Present Illness:  Abdominal Pain      [name] who presents with Abdominal pain.  The patient reports nausea and vomiting, but denies diarrhea, constipation, melena, hematochezia, anorexia, and hematemesis.  The location of the pain is epigastric.  The pain is described as intermittent.  The patient denies the following symptoms: fever and weight loss.  The pain is worse with food.   hx of fundoplication and cholecystectomy and appendectomy  All other systems reviewed and were negative   Allergies: 1)  ! Methotrexate  Comments:  Nurse/Medical Assistant: abd pain  The patient's medications and allergies were reviewed with the patient and were updated in the Medication and Allergy Lists. Gladis Riffle, RN (June 25, 2009 9:02 AM)  Physical Exam  General:  nad, obese Head:  normocephalic and atraumatic.   Eyes:  pupils equal and pupils round.  no icterus Ears:  R ear normal and L ear normal.   Neck:  no masses, thyromegaly, or abnormal cervical nodes Chest Wall:  No deformities, masses, or tenderness noted. Lungs:  Normal respiratory effort, chest expands symmetrically. Lungs are clear to auscultation, no crackles or wheezes. Heart:  normal rate and no murmur.   Abdomen:  Bowel sounds positive,abdomen soft and non-tender without masses, organomegaly or hernias noted. unable to ilicit pain  obese Msk:  No deformity or scoliosis noted of thoracic or lumbar spine.   no joint swelling or erythema Extremities:  trace left pedal edema and trace right pedal edema.   Neurologic:  cranial  nerves II-XII intact.     Impression & Recommendations:  Problem # 1:  ABDOMINAL PAIN (ICD-789.00)  unclear cause subacute onset  hx of multiple surgeries will check labs may need to consider retained CBD stone, mesenteric ischemia Her updated medication list for this problem includes:    Remicade 100 Mg Solr (Infliximab) ..... Injection every 6 weeks  Orders: Venipuncture (21308) TLB-BMP (Basic Metabolic Panel-BMET) (80048-METABOL) TLB-CBC Platelet - w/Differential (85025-CBCD) TLB-Hepatic/Liver Function Pnl (80076-HEPATIC) T-Culture, Urine (65784-69629)  Problem # 2:  SLEEP APNEA (ICD-780.57) she complains of fatigue---I suspect this is the underlying cause. She will try to use CPAP again (has been noncompliant)  Complete Medication List: 1)  Atenolol-chlorthalidone 50-25 Mg Tabs (Atenolol-chlorthalidone) .... Take 1 tablet by mouth once a day 2)  Budeprion Xl 300 Mg Tb24 (Bupropion hcl) .... Take 1 tablet by mouth once a day 3)  Effexor Xr 75 Mg Cp24 (Venlafaxine hcl) .... Take 1 capsule by mouth two times a day 4)  Lorazepam 1 Mg Tabs (Lorazepam) .... One every 6 hrs as needed 5)  Novolog Flexpen 100 Unit/ml Soln (Insulin aspart) .... Three times a day as directed 6)  Onetouch Ultra Test Strp (Glucose blood) .... Tid 7)  Potassium Chloride Crys Cr 20 Meq Tbcr (Potassium chloride crys cr) .[name].. 1 three times a day 8)  Prednisone 10 Mg Tabs (Prednisone) .[name].. 1 in am   1/2 in pm 9)  Zegerid  40-1100 Mg Caps (Omeprazole-sodium bicarbonate) .... One twice daily 10)  Crestor 20 Mg Tabs (Rosuvastatin calcium) .[name].. 1 by mouth once daily 11)  Pen Needles 31g X 8 Mm Misc (Insulin pen needle) .... Use as directed 12)  Remicade 100 Mg Solr (Infliximab) .... Injection every 6 weeks 13)  Fluticasone Propionate 50 Mcg/act Susp (Fluticasone propionate) .... 2 sprays each nostril once daily 14)  Furosemide 20 Mg Tabs (Furosemide) .... Take one (1) by mouth daily 15)  Vicodin 5-500 Mg Tabs  (Hydrocodone-acetaminophen) .... One tab every 6 hours as needed 16)  Vitamin D 1000 Unit Caps (Cholecalciferol) .... Two times a day 17)  Estrace 1 Mg Tabs (Estradiol) .... Take 1 tablet by mouth once a day 18)  Slow Release Iron 47.5 Mg Cr-tabs (Ferrous sulfate) .... Once daily 19)  Multiple Vitamin Tabs (Multiple vitamin) .... Once daily 20)  Tussionex Pennkinetic Er 8-10 Mg/31ml Lqcr (Chlorpheniramine-hydrocodone) .... Take 1 teaspoon every 12 hrs as needed cough 21)  Restasis 0.05 % Emul (Cyclosporine) .[name].. 1 drop each eye daily 22)  Lotemax 0.5 % Susp (Loteprednol etabonate) .[name].. 1 drop each eye every 6 hours as needed 23)  Utac 500-500 Mg Tabs (Methenamine mand-sod phosphate) .... Two times a day 24)  Zantac 150 Mg Tabs (Ranitidine hcl) .... One daily 25)  Tessalon Perles 100 Mg Caps (Benzonatate) .... One to two by mouth 3-4 times daily

## 2010-11-12 NOTE — Assessment & Plan Note (Signed)
Summary: SW PT/SARCOIDOSIS PAIN CHEST TO BACK/PS   Vital Signs:  Patient profile:   50 year old female Weight:      254.6 pounds O2 Sat:      94 % Temp:     98.6 degrees F Pulse rate:   93 / minute BP sitting:   130 / 84  (left arm) Cuff size:   large  Vitals Entered By: [name], RN (December 17, 2009 2:59 PM) CC: seen by dr swords March 1 and by Dr Vassie Loll. c/o pain rt chest area with rt axialle pain has been coughing for about 2 days    History of Present Illness: This 50 year old white female patient of Dr. Birdie Sons and Dr. Regino Bellow, pulmonologist,and Dr. Danise Mina and has lipoma on the 14th Patient has sarcoidosis, hypertension GERD as well as a diabetic2 her CBGs have been ranging 120-125 Her main complaint today is the fact that she has had pain over the right anterior chest for the past 2-3 days and also has been coughing a nonproductive cough. The pain is in the anterior chest and she relates it radiates to her back She can have the pain on deep inspiration or on movement No symptoms of GERD nor other gastrointestinal symptoms Pain is relieved with hydrocodone  Allergies: 1)  ! Methotrexate  Past History:  Past Medical History: Last updated: 10/13/2009 EDEMA (ICD-782.3) HEADACHE (ICD-784.0) MENOPAUSAL SYNDROME (ICD-627.2) UTI (ICD-599.0) SARCOIDOSIS (ICD-135)   -CT Chest 6/08 improvement in Mediastinal LAN   -11/08 start of remecade Rx per [facility] PULMONARY NODULE (ICD-518.89)    -CT Chest 6/08 reduction in size of nodules due to Arava and Pred RX     -probable granulomas due to Sarcoidosis HYPERTENSION (ICD-401.9) HYPERLIPIDEMIA (ICD-272.4) GERD (ICD-530.81) Sleep APnea DIABETES MELLITUS, TYPE II (ICD-250.00) ANEMIA-NOS (ICD-285.9)  Fundic Gland Polyp  Past Surgical History: Last updated: 08/21/2006 Ventral Hernia Repair Nissen Fundoplication Cholecystectomy Hysterectomy  Social History: Last updated: 10/20/2009 Married, live with  husband Never Smoked Regular exercise-no Alcohol use-no Illicit Drug Use - no  Risk Factors: Smoking Status: never (11/07/2009)  Review of Systems  The patient denies anorexia, fever, weight loss, weight gain, vision loss, decreased hearing, hoarseness, chest pain, syncope, dyspnea on exertion, peripheral edema, prolonged cough, headaches, hemoptysis, abdominal pain, melena, hematochezia, severe indigestion/heartburn, hematuria, incontinence, genital sores, muscle weakness, suspicious skin lesions, transient blindness, difficulty walking, depression, unusual weight change, abnormal bleeding, enlarged lymph nodes, angioedema, breast masses, and testicular masses.    Physical Exam  General:  Well-developed,well-nourished,in no acute distress; alert,appropriate and cooperative throughout examinationoverweight-appearing.   Chest Wall:  marked tenderness costochondral joints 2-7 Lungs:  Normal respiratory effort, chest expands symmetrically. Lungs are clear to auscultation, no crackles or wheezes. Heart:  Normal rate and regular rhythm. S1 and S2 normal without gallop, murmur, click, rub or other extra sounds.   Impression & Recommendations:  Problem # 1:  COSTOCHONDRITIS (ICD-733.6) Assessment New  Orders: Depo- Medrol 80mg  (J1040) Depo- Medrol 40mg  (J1030) Admin of Therapeutic Inj  intramuscular or subcutaneous ([address]) diclofenac 75 mg b.i.d.  Problem # 2:  OBESITY (ICD-278.00) Assessment: Unchanged  Problem # 3:  SLEEP APNEA (ICD-780.57) Assessment: Unchanged  Problem # 4:  SARCOIDOSIS (ICD-135) Assessment: Unchanged  Problem # 5:  DIABETES MELLITUS, TYPE II (ICD-250.00) Assessment: Improved  Her updated medication list for this problem includes:    Novolog Flexpen 100 Unit/ml Soln (Insulin aspart) .[name]... Three times a day as directed  Problem # 6:  HYPERTENSION (ICD-401.9)  Assessment: Improved  Her updated medication list for this problem includes:     Atenolol-chlorthalidone 50-25 Mg Tabs (Atenolol-chlorthalidone) .[name]... Take 1 tablet by mouth once a day    Furosemide 20 Mg Tabs (Furosemide) .[name]... Take one (1) by mouth daily  Complete Medication List: 1)  Atenolol-chlorthalidone 50-25 Mg Tabs (Atenolol-chlorthalidone) .... Take 1 tablet by mouth once a day 2)  Effexor Xr 75 Mg Cp24 (Venlafaxine hcl) .... Take 1 tablet by mouth once a day 3)  Lorazepam 1 Mg Tabs (Lorazepam) .... One every 6 hrs as needed 4)  Novolog Flexpen 100 Unit/ml Soln (Insulin aspart) .... Three times a day as directed 5)  Onetouch Ultra Test Strp (Glucose blood) .... Tid 6)  Potassium Chloride Crys Cr 20 Meq Tbcr (Potassium chloride crys cr) .[name].. 1 three times a day 7)  Zegerid 40-1100 Mg Caps (Omeprazole-sodium bicarbonate) .... Take 1 tablet by mouth once a day 8)  Pen Needles 31g X 8 Mm Misc (Insulin pen needle) .... Use as directed 9)  Remicade 100 Mg Solr (Infliximab) .... Injection every 6 weeks 10)  Fluticasone Propionate 50 Mcg/act Susp (Fluticasone propionate) .... 2 sprays each nostril once daily 11)  Furosemide 20 Mg Tabs (Furosemide) .... Take one (1) by mouth daily 12)  Vicodin 5-500 Mg Tabs (Hydrocodone-acetaminophen) .... One tab every 6 hours as needed 13)  Estrace 1 Mg Tabs (Estradiol) .... Take 1 tablet by mouth once a day 14)  Restasis 0.05 % Emul (Cyclosporine) .[name].. 1 drop each eye daily 15)  Lotemax 0.5 % Susp (Loteprednol etabonate) .[name].. 1 drop each eye every 6 hours as needed 16)  Tussionex Pennkinetic Er 8-10 Mg/62ml Lqcr (Chlorpheniramine-hydrocodone) .[name].. 1 tsp every 12 hr as needed for cough, may make you sleepy 17)  Simvastatin 40 Mg Tabs (Simvastatin) .... Take one tablet at bedtime start one week after completing diflucan 18)  Magnesium Oxide 250 Mg Tabs (Magnesium oxide) .... One tablet by mouth once daily 19)  Levbid 0.375 Mg Xr12h-tab (Hyoscyamine sulfate) .[name].. 1 by mouth two times a day as needed 20)  Prednisone 5 Mg Tabs (Prednisone)  .... Take 1 tablet by mouth once a day 21)  Cpap 7 Cm   Patient Instructions: 1)  You have costochondritis rt chest  2)  depomedrol 120 mg IM for inflammation 3)  diclofenac 75 mgt two times a day after meals  4)  use hydrocodone as needed for pain 5)  avoid lifting objects with rt arm 1-2 wks   Medication Administration  Injection # 1:    Medication: Depo- Medrol 80mg     Diagnosis: COSTOCHONDRITIS (ICD-733.6)    Route: IM    Site: RUOQ gluteus    Exp Date: 07/2010    Lot #: [id number]    Mfr: [employer]    Patient tolerated injection without complications    Given by: [name], RN (December 17, 2009 4:06 PM)  Injection # 2:    Medication: Depo- Medrol 40mg     Diagnosis: COSTOCHONDRITIS (ICD-733.6)    Route: IM    Site: RUOQ gluteus    Exp Date: 07/2010    Lot #: [id number]    Mfr: [employer]    Patient tolerated injection without complications    Given by: [name], RN (December 17, 2009 4:07 PM)  Orders Added: 1)  Depo- Medrol 80mg  [J1040] 2)  Depo- Medrol 40mg  [J1030] 3)  Admin of Therapeutic Inj  intramuscular or subcutaneous [96372] 4)  Est. Patient Level IV [[address]]

## 2010-11-12 NOTE — Assessment & Plan Note (Signed)
Summary: ROA//SLM  Medications Added CRESTOR 20 MG TABS (ROSUVASTATIN CALCIUM) 1 by mouth once daily CIPRO 250 MG TABS (CIPROFLOXACIN HCL) 1 two times a day 5d PREMARIN 0.3 MG TABS (ESTROGENS CONJUGATED) once daily        Vital Signs:  Patient Profile:   50 Years Old Female Weight:      275 pounds Temp:     98 degrees F Pulse rate:   70 / minute BP supine:   136 / 80                 History of Present Illness:  Follow-Up Visit: dm, sarcoid, cough (much improved)      [name] who presents for Follow-up visit.  The patient denies chest pain, palpitations, dizziness, syncope, low blood sugar symptoms, high blood sugar symptoms, edema, SOB, DOE, PND, and orthopnea.  Since the last visit the patient notes no new problems or concerns.  The patient reports taking meds as prescribed.  When questioned about possible medication side effects, the patient notes none.    UTI-she thinks she has one for the past 2 days (frequency, urgency)  Current Allergies: ! METHOTREXATE  Past Medical History:    Reviewed history from 08/21/2006 and no changes required:       Anemia-NOS       Diabetes mellitus, type II       GERD       Hyperlipidemia       Hypertension       pulmonary nodule       OSA       Vocal Cord dysfunction with recurrent cough       Asthma vs Vocal cord dysfundtion       SARCOID  Past Surgical History:    Reviewed history from 08/21/2006 and no changes required:       Ventral Hernia Repair       Nissen Fundoplication       Cholecystectomy       Hysterectomy   Family History:    father-alive MI, DM    mother alive and well    uncle with SARCOID  Social History:    Reviewed history from 05/31/2007 and no changes required:       Married       Never Smoked       Regular exercise-no       Alcohol use-no    Review of Systems       no other complaints in a complete ROS    Physical Exam  General:     Well-developed,well-nourished,in  no acute distress; alert,appropriate and cooperative throughout examination Head:     normocephalic and atraumatic.   Neck:     No deformities, masses, or tenderness noted. Chest Wall:     No deformities, masses, or tenderness noted. Lungs:     Normal respiratory effort, chest expands symmetrically. Lungs are clear to auscultation, no crackles or wheezes. Heart:     Normal rate and regular rhythm. S1 and S2 normal without gallop, murmur, click, rub or other extra sounds. Abdomen:     Bowel sounds positive,abdomen soft and non-tender without masses, organomegaly or hernias noted. Msk:     No deformity or scoliosis noted of thoracic or lumbar spine.   Extremities:     No clubbing, cyanosis, edema, or deformity noted  Neurologic:     No cranial nerve deficits noted. Station and gait are normal. . Sensory, motor and coordinative  functions appear intact. Skin:     Intact without suspicious lesions or rashes Psych:     Cognition and judgment appear intact. Alert and cooperative with normal attention span and concentration. No apparent delusions, illusions, hallucinations    Impression & Recommendations:  Problem # 1:  SARCOIDOSIS (ICD-135) has started remicade---also prednisone 15 mg by mouth once daily   Problem # 2:  HYPERTENSION (ICD-401.9) continue curent medications Her updated medication list for this problem includes:    Atenolol-chlorthalidone 50-25 Mg Tabs (Atenolol-chlorthalidone) .[name]... 1/1/2 daily    Amlodipine Besylate 10 Mg Tabs (Amlodipine besylate) ..... One tablet daily  Prior BP: 132/74 (07/05/2007)  Prior 10 Yr Risk Heart Disease: 8 % (09/21/2006)  Labs Reviewed: Creat: 0.7 (08/25/2007) Chol: 215 (08/25/2007)   HDL: 44.4 (08/25/2007)   LDL: DEL (08/25/2007)   TG: 360 (08/25/2007)   Problem # 3:  HYPERLIPIDEMIA (ICD-272.4) continue current medications Her updated medication list for this problem includes:    Crestor 20 Mg Tabs (Rosuvastatin calcium) .[name]... 1  by mouth once daily  Labs Reviewed: Chol: 215 (08/25/2007)   HDL: 44.4 (08/25/2007)   LDL: DEL (08/25/2007)   TG: 360 (08/25/2007) SGOT: 24 (08/25/2007)   SGPT: 33 (08/25/2007)  Prior 10 Yr Risk Heart Disease: 8 % (09/21/2006)   Problem # 4:  DIABETES MELLITUS, TYPE II (ICD-250.00) fair control Her updated medication list for this problem includes:    Novolog Flexpen 100 Unit/ml Soln (Insulin aspart)  Labs Reviewed: HgBA1c: 7.0 (08/25/2007)   Creat: 0.7 (08/25/2007)      Problem # 5:  UTI (ICD-599.0)  Her updated medication list for this problem includes:    Cipro 250 Mg Tabs (Ciprofloxacin hcl) .[name]... 1 two times a day 5d   Problem # 6:  MENOPAUSAL SYNDROME (ICD-627.2)  Her updated medication list for this problem includes:    Premarin 0.3 Mg Tabs (Estrogens conjugated) ..... Once daily   Complete Medication List: 1)  Albuterol 90 Mcg/act Aers (Albuterol) .... Inhale 2 puff as directed three times a day 2)  Atenolol-chlorthalidone 50-25 Mg Tabs (Atenolol-chlorthalidone) .... 1/1/2 daily 3)  Budeprion Xl 300 Mg Tb24 (Bupropion hcl) .... Take 1 tablet by mouth once a day 4)  Effexor Xr 75 Mg Cp24 (Venlafaxine hcl) .... Take 1 capsule by mouth once a day 5)  Lorazepam 1 Mg Tabs (Lorazepam) .... Prn 6)  Novolog Flexpen 100 Unit/ml Soln (Insulin aspart) 7)  Onetouch Ultra Test Strp (Glucose blood) 8)  Potassium Chloride Crys Cr 20 Meq Tbcr (Potassium chloride crys cr) .... Take 1 tablet by mouth once a day 9)  Prednisone 20 Mg Tabs (Prednisone) .[name].. 1 by mouth once daily 10)  Reglan 10 Mg Tabs (Metoclopramide hcl) .... Take 1 tablet by mouth twice a day 11)  Zegerid 40-1100 Mg Caps (Omeprazole-sodium bicarbonate) .... 2 by mouth daily 12)  Crestor 20 Mg Tabs (Rosuvastatin calcium) .[name].. 1 by mouth once daily 13)  Qvar 80 Mcg/act Aers (Beclomethasone dipropionate) .... Two puffs twice daily 14)  Amlodipine Besylate 10 Mg Tabs (Amlodipine besylate) .... One tablet daily 15)  Pen  Needles 31g X 8 Mm Misc (Insulin pen needle) .... Use as directed need f/u appt b-4 addtional refills 16)  Cipro 250 Mg Tabs (Ciprofloxacin hcl) .[name].. 1 two times a day 5d 17)  Premarin 0.3 Mg Tabs (Estrogens conjugated) .... Once daily   Patient Instructions: 1)  7 week follow up     Prescriptions: PREMARIN 0.3 MG TABS (ESTROGENS CONJUGATED) once daily  #30 x  11   Entered and Authorized by:   Birdie Sons MD   Signed by:   Birdie Sons MD on 09/18/2007   Method used:   Electronically sent to ...       [facility]       [address]       [place], [place]  [address]       Ph: [phone]       Fax: [phone]   RxID:   [id number] CIPRO 250 MG TABS (CIPROFLOXACIN HCL) 1 two times a day 5d  #10 x 0   Entered and Authorized by:   Birdie Sons MD   Signed by:   Birdie Sons MD on 09/15/2007   Method used:   Electronically sent to ...       [facility]       [address]       [place], [place]  [address]       Ph: [phone]       Fax: [phone]   RxID:   [id number]  ] Laboratory Results   Urine Tests   Date/Time Reported: September 15, 2007 3:32 PM   Routine Urinalysis   Color: yellow Appearance: Clear Glucose: negative   (Normal Range: Negative) Bilirubin: negative   (Normal Range: Negative) Ketone: trace (5)   (Normal Range: Negative) Spec. Gravity: 1.025   (Normal Range: 1.003-1.035) Blood: negative   (Normal Range: Negative) pH: 5.0   (Normal Range: 5.0-8.0) Protein: negative   (Normal Range: Negative) Urobilinogen: 0.2   (Normal Range: 0-1) Nitrite: negative   (Normal Range: Negative) Leukocyte Esterace: 1+   (Normal Range: Negative)    Comments: ..................................................................Marland KitchenWynona Canes, CMA  September 15, 2007 3:32 PM

## 2010-11-12 NOTE — Progress Notes (Signed)
Summary: still has headache  Medications Added CEFTIN 250 MG  TABS (CEFUROXIME AXETIL) one by mouth bid       Phone Note Call from Patient Call back at Home Phone [phone]   Caller: patient triage message Call For: Swords Summary of Call: Was in last week to see Dr Cato Mulligan about a headache.  Was told to call back on Friday if she still has a headache.  She is still having a headache.  [facility] [address]  Initial call taken by: Roselle Locus,  October 20, 2007 8:52 AM  Follow-up for Phone Call        ceftin 250 mg by mouth two times a day for 10 days---? sinusitis Follow-up by: Birdie Sons MD,  October 20, 2007 2:18 PM  Additional Follow-up for Phone Call Additional follow up Details #1::        Husband called and I advised Dr Cato Mulligan had suggested a medicine and the triage nurse would call it in to [facility] for him to pick up  Additional Follow-up by: Roselle Locus,  October 20, 2007 2:59 PM    New/Updated Medications: CEFTIN 250 MG  TABS (CEFUROXIME AXETIL) one by mouth bid   Prescriptions: CEFTIN 250 MG  TABS (CEFUROXIME AXETIL) one by mouth bid  #20 x 0   Entered by:   Lynann Beaver CMA   Authorized by:   Birdie Sons MD   Signed by:   Lynann Beaver CMA on 10/20/2007   Method used:   Electronically sent to ...       [facility]       [address]       [place], [place]  [address]       Ph: [phone]       Fax: [phone]   RxID:   [id number]

## 2010-11-12 NOTE — Miscellaneous (Signed)
Summary: Discharge Summary for PT [facility]  Discharge Summary for PT Services/[place]   Imported By: Maryln Gottron 11/14/2009 14:04:42  _____________________________________________________________________  External Attachment:    Type:   Image     Comment:   External Document

## 2010-11-12 NOTE — Assessment & Plan Note (Signed)
Summary: Pulmonary OV   Copy to:  Dr. Delford Field Primary Provider/Referring Provider:  Birdie Sons, MD   CC:  2 mo follow up.  c/o sharp pain in the middle of chest x2days that "comes and goes."  states pain does not radiate. c/o nasal and sinus pressure and PND x2days states breathing is the same-no better and no worse. [name]  History of Present Illness:  50 year old, white female with chronic pulmonary sarcoidosis.  This is been manifested by cyclic cough and mediastinal adenopathy with associated chest pain.  There are also stable pulmonary nodules.  She has been intolerant of systemic corticosteroids &  had adverse side effects to Arava(leflunamide).    On this basis we referred the patient in November 2008  to Dr. Azzie Roup,  for Remicade injections.  There has been a positive response to Rx.  September 08, 2009 11:01 AM The pt has been ill for two - three weeks .  [name] tired and achy.  There is excess daytime somnolence. The pt saw [facility] and rx: 1)  Increase Prednisone 40mg  once daily for 3 days, then 30mg  once daily for 3 days, then 20mg  once daily for 1 week, then 15mg  once daily for week then 10mg  once daily and hold.  2)  Mucinex DM two times a day as needed cough/congestion 3)  Tessalon three times a day as needed cough 4)  Tussionex 1 tsp every 12 hr as needed couigh, may make you sleepy.  The pt then developed yeast in mouth The cough is now dry. The pt  is  using mucinex DM and tussionex and is tired with this. No wheeze. The pt is  now down to 10mg /d pred The pt notes sl amount of heartburn The pt has headaches and sinus pressure and post nasal drip. The mucous is clear.      October 20, 2009 5:17 PM  Sleep consult Epworth Sleepiness Score 9/24. Sleep latency x 30 mins, 2 awakenings for BR visits, no post void latency, snoring +, occasionally wakes herself up, no witnessed apneas, Wakes up at 7 A feeling tired, dryness +, occ. headaches.  Gained 10 lbs in the last 2  years. Reviewed overnight PSG >> moderate obstructive sleep apnea with predominant hypopneas & RERAs, RDI 25/h,AHI 15/h with nadir desatn to 89%, no arrythmias, PLms. There is no history suggestive of cataplexy, sleep paralysis or parasomnias  She had severe GERD s/p fundoplication in 2001.  November 07, 2009 9:51 AM Having more chest pain.  Now not much cough.  Pred is down to 10mg /d.  Still on remecade for sarcoid.  No real mucous.  Now on cpap per dr Vassie Loll   Preventive Screening-Counseling & Management  Alcohol-Tobacco     Smoking Status: never  Current Medications (verified): 1)  Atenolol-Chlorthalidone 50-25 Mg Tabs (Atenolol-Chlorthalidone) .... Take 1 Tablet By Mouth Once A Day 2)  Effexor Xr 75 Mg Cp24 (Venlafaxine Hcl) .... Take 1 Tablet By Mouth Once A Day 3)  Lorazepam 1 Mg Tabs (Lorazepam) .... One Every 6 Hrs As Needed 4)  Novolog Flexpen 100 Unit/ml Soln (Insulin Aspart) .... Three Times A Day As Directed 5)  Onetouch Ultra Test  Strp (Glucose Blood) .... Tid 6)  Potassium Chloride Crys Cr 20 Meq Tbcr (Potassium Chloride Crys Cr) .[name].. 1 Three Times A Day 7)  Prednisone 10 Mg Tabs (Prednisone) .... Take As Directed. 8)  Zegerid 40-1100 Mg Caps (Omeprazole-Sodium Bicarbonate) .... Take 1 Tablet By Mouth Once A Day 9)  Pen Needles 31g X 8 Mm  Misc (Insulin Pen Needle) .... Use As Directed 10)  Remicade 100 Mg  Solr (Infliximab) .... Injection Every 6 Weeks 11)  Fluticasone Propionate 50 Mcg/act  Susp (Fluticasone Propionate) .... 2 Sprays Each Nostril Once Daily 12)  Furosemide 20 Mg Tabs (Furosemide) .... Take One (1) By Mouth Daily 13)  Vicodin 5-500 Mg  Tabs (Hydrocodone-Acetaminophen) .... One Tab Every 6 Hours As Needed 14)  Estrace 1 Mg Tabs (Estradiol) .... Take 1 Tablet By Mouth Once A Day 15)  Restasis 0.05 % Emul (Cyclosporine) .[name].. 1 Drop Each Eye Daily 16)  Lotemax 0.5 % Susp (Loteprednol Etabonate) .[name].. 1 Drop Each Eye Every 6 Hours As Needed 17)  Tussionex  Pennkinetic Er 8-10 Mg/21ml Lqcr (Chlorpheniramine-Hydrocodone) .[name].. 1 Tsp Every 12 Hr As Needed For Cough, May Make You Sleepy 18)  Simvastatin 40 Mg Tabs (Simvastatin) .... Take One Tablet At Bedtime Start One Week After Completing Diflucan 19)  Magnesium Oxide 250 Mg Tabs (Magnesium Oxide) .... One Tablet By Mouth Once Daily 20)  Levbid 0.375 Mg Xr12h-Tab (Hyoscyamine Sulfate) .[name].. 1 By Mouth Two Times A Day As Needed  Allergies (verified): 1)  ! Methotrexate  Past History:  Past medical, surgical, family and social histories (including risk factors) reviewed, and no changes noted (except as noted below).  Past Medical History: Reviewed history from 10/13/2009 and no changes required. EDEMA (ICD-782.3) HEADACHE (ICD-784.0) MENOPAUSAL SYNDROME (ICD-627.2) UTI (ICD-599.0) SARCOIDOSIS (ICD-135)   -CT Chest 6/08 improvement in Mediastinal LAN   -11/08 start of remecade Rx per [facility] PULMONARY NODULE (ICD-518.89)    -CT Chest 6/08 reduction in size of nodules due to Arava and Pred RX     -probable granulomas due to Sarcoidosis HYPERTENSION (ICD-401.9) HYPERLIPIDEMIA (ICD-272.4) GERD (ICD-530.81) Sleep APnea DIABETES MELLITUS, TYPE II (ICD-250.00) ANEMIA-NOS (ICD-285.9)  Fundic Gland Polyp  Past Surgical History: Reviewed history from 08/21/2006 and no changes required. Ventral Hernia Repair Nissen Fundoplication Cholecystectomy Hysterectomy  Family History: Reviewed history from 10/20/2009 and no changes required. father-alive MI, DM mother alive and well uncle with SARCOID No FH of Colon Cancer: Family History Emphysema-Mother, Father  Family History Asthma-Asthma Family History COPD -mother  Social History: Reviewed history from 10/20/2009 and no changes required. Married, live with husband Never Smoked Regular exercise-no Alcohol use-no Illicit Drug Use - no  Review of Systems       The patient complains of shortness of breath with activity,  non-productive cough, acid heartburn, and indigestion.  The patient denies shortness of breath at rest, productive cough, coughing up blood, chest pain, irregular heartbeats, loss of appetite, weight change, abdominal pain, difficulty swallowing, sore throat, tooth/dental problems, headaches, nasal congestion/difficulty breathing through nose, sneezing, itching, ear ache, anxiety, depression, hand/feet swelling, joint stiffness or pain, rash, change in color of mucus, and fever.    Vital Signs:  Patient profile:   50 year old female Height:      66 inches Weight:      271.13 pounds BMI:     43.92 O2 Sat:      98 % on Room air Temp:     98.1 degrees F oral Pulse rate:   80 / minute BP sitting:   120 / 74  (right arm) Cuff size:   large  Vitals Entered By: Gweneth Dimitri RN (November 07, 2009 9:34 AM)  O2 Flow:  Room air CC: 2 mo follow up.  c/o sharp pain in the middle of chest x2days that "  comes and goes."  states pain does not radiate. c/o nasal and sinus pressure and PND x2days states breathing is the same-no better, no worse.  Comments Medications reviewed with patient Daytime contact number verified with patient. Gweneth Dimitri RN  November 07, 2009 9:36 AM    Physical Exam  Additional Exam:   GEN: A/Ox3; pleasant , NAD, obese female.  HEENT:  [place]/AT, , EACs-clear, TMs-wnl, NOSE-mild erythema THROAT-clear NECK:  Supple w/ fair ROM; no JVD; normal carotid impulses w/o bruits; no thyromegaly or nodules palpated; no lymphadenopathy. RESP  Coarse BS w/ no wheeizng, has some mild upper airway psuedowheezing on forced exp.  CARD:  RRR, no m/r/g   GI:   Soft & nt; nml bowel sounds; no organomegaly or masses detected. [place]: Warm bil,  no calf tenderness edema, clubbing, pulses intact Neuro: intact w/ no focal deficits.    Impression & Recommendations:  Problem # 1:  SARCOIDOSIS (ICD-135) Assessment Unchanged Stable pulmonary sarcoid,  cyclic cough due to microaspiration with  GERD plan Use 1mg  prednisone to taper prednisone as follows: Reduce to 9mg  per day prednisone for 10 days then reduce every 10days by 1mg  until you are at 5mg  per day and stay No other medication changes  Medications Added to Medication List This Visit: 1)  Prednisone 1 Mg Tabs (Prednisone) .... Use for prednisone taper as directed  Other Orders: Est. Patient Level III (16109)  Patient Instructions: 1)  Use 1mg  prednisone to taper prednisone as follows: 2)  Reduce to 9mg  per day prednisone for 10 days then reduce every 10days by 1mg  until you are at 5mg  per day and stay 3)  No other medication changes 4)  Return two months 5)  Prednisone 1mg  rx sent to pharmacy Prescriptions: PREDNISONE 1 MG TABS (PREDNISONE) Use for prednisone taper as directed  #60 x 6   Entered and Authorized by:   Storm Frisk MD   Signed by:   Storm Frisk MD on 11/07/2009   Method used:   Electronically to        [facility] [address] (retail)       [address]       [place], [place]  [address]       Ph: [phone]       Fax: [phone]   RxID:   [id number]

## 2010-11-12 NOTE — Assessment & Plan Note (Signed)
Summary: CONSTANT ITCHING ON ARMS (ALLERGIC REACTION?) // RS   Vital Signs:  Patient profile:   50 year old female Temp:     98.0 degrees F oral BP sitting:   120 / 80  (left arm) Cuff size:   large  Vitals Entered By: Sid Falcon LPN (Feb 25, 2010 3:49 PM) CC: itiching X 3 days, rash on arms   History of Present Illness: Patient seen with complaint of rash and increased pruritus arms and legs over the past couple days. Recent Valtrex but symptoms did not start until she was finishing Valtrex. No recent change in soaps or detergent. Taken Benadryl without relief. No shortness of breath or wheezing. No angioedema symptoms. No history of similar rash in the past.  Allergies: 1)  ! Methotrexate  Past History:  Past Medical History: Last updated: 10/13/2009 EDEMA (ICD-782.3) HEADACHE (ICD-784.0) MENOPAUSAL SYNDROME (ICD-627.2) UTI (ICD-599.0) SARCOIDOSIS (ICD-135)   -CT Chest 6/08 improvement in Mediastinal LAN   -11/08 start of remecade Rx per [facility] PULMONARY NODULE (ICD-518.89)    -CT Chest 6/08 reduction in size of nodules due to Arava and Pred RX     -probable granulomas due to Sarcoidosis HYPERTENSION (ICD-401.9) HYPERLIPIDEMIA (ICD-272.4) GERD (ICD-530.81) Sleep APnea DIABETES MELLITUS, TYPE II (ICD-250.00) ANEMIA-NOS (ICD-285.9)  Fundic Gland Polyp PMH reviewed for relevance  Review of Systems  The patient denies fever, dyspnea on exertion, peripheral edema, prolonged cough, and headaches.    Physical Exam  General:  Well-developed,well-nourished,in no acute distress; alert,appropriate and cooperative throughout examination Mouth:  Oral mucosa and oropharynx without lesions or exudates.  Teeth in good repair. Lungs:  Normal respiratory effort, chest expands symmetrically. Lungs are clear to auscultation, no crackles or wheezes. Heart:  Normal rate and regular rhythm. S1 and S2 normal without gallop, murmur, click, rub or other extra  sounds. Skin:  slighltly raised erythematous urticaria which blanch with pressure arms, forearms, and legs. Cervical Nodes:  No lymphadenopathy noted   Impression & Recommendations:  Problem # 1:  URTICARIA (ICD-708.9)  no clear etiology. Try H2 blocker with antihistamine and given severity of symptoms Depo-Medrol 80 mg given  Orders: Depo- Medrol 80mg  (J1040) Admin of Therapeutic Inj  intramuscular or subcutaneous (78469)  Complete Medication List: 1)  Atenolol-chlorthalidone 50-25 Mg Tabs (Atenolol-chlorthalidone) .... Take 1 tablet by mouth once a day 2)  Effexor Xr 75 Mg Cp24 (Venlafaxine hcl) .... Take 1 tablet by mouth once a day 3)  Lorazepam 1 Mg Tabs (Lorazepam) .... One every 6 hrs as needed 4)  Potassium Chloride Crys Cr 20 Meq Tbcr (Potassium chloride crys cr) .[name].. 1 three times a day 5)  Zegerid 40-1100 Mg Caps (Omeprazole-sodium bicarbonate) .... Take 1 tablet by mouth once a day 6)  Remicade 100 Mg Solr (Infliximab) .... Injection every 6 weeks 7)  Fluticasone Propionate 50 Mcg/act Susp (Fluticasone propionate) .... 2 sprays each nostril once daily 8)  Furosemide 20 Mg Tabs (Furosemide) .... Take one (1) by mouth daily 9)  Vicodin 5-500 Mg Tabs (Hydrocodone-acetaminophen) .... One tab every 6 hours as needed 10)  Estrace 1 Mg Tabs (Estradiol) .... Take 1 tablet by mouth once a day 11)  Restasis 0.05 % Emul (Cyclosporine) .[name].. 1 drop each eye daily 12)  Lotemax 0.5 % Susp (Loteprednol etabonate) .[name].. 1 drop each eye every 6 hours as needed 13)  Simvastatin 40 Mg Tabs (Simvastatin) .... Take one tablet at bedtime start one week after completing diflucan 14)  Levbid 0.375 Mg Xr12h-tab (Hyoscyamine  sulfate) .[name].. 1 by mouth two times a day as needed 15)  Prednisone 5 Mg Tabs (Prednisone) .... Take 1 tablet by mouth every other day 16)  Cpap 7 Cm  17)  Nystatin-triamcinolone 100000-0.1 Unit/gm-% Crea (Nystatin-triamcinolone) .... Apply two times a day to affected area 18)   Hydromet 5-1.5 Mg/82ml Syrp (Hydrocodone-homatropine) .[name].. 1 tsp three times a day as needed cough 19)  Valtrex 1 Gm Tabs (Valacyclovir hcl) .... Take 1 tablet by mouth three times a day  Patient Instructions: 1)  try over-the-counter Zantac or Pepcid in addition to Benadryl   Medication Administration  Injection # 1:    Medication: Depo- Medrol 80mg     Diagnosis: URTICARIA (ICD-708.9)    Route: IM    Site: RUOQ gluteus    Exp Date: 09/10/2010    Lot #: [id number]    Mfr: [employer]    Patient tolerated injection without complications    Given by: Sid Falcon LPN (Feb 25, 2010 4:27 PM)  Orders Added: 1)  Est. Patient Level III [08657] 2)  Depo- Medrol 80mg  [J1040] 3)  Admin of Therapeutic Inj  intramuscular or subcutaneous [84696]

## 2010-11-12 NOTE — Medication Information (Signed)
Summary: authorization  authorization   Imported By: Kassie Mends 01/05/2008 08:54:45  _____________________________________________________________________  External Attachment:    Type:   Image     Comment:   authorization

## 2010-11-12 NOTE — Consult Note (Signed)
Summary: [facility]   Imported By: Esmeralda Links D'jimraou 02/05/2008 10:51:58  _____________________________________________________________________  External Attachment:    Type:   Image     Comment:   External Document

## 2010-11-12 NOTE — Assessment & Plan Note (Signed)
Summary: Pulmonary OV   Referred by:  Chase Picket PCP:  Dr. Birdie Sons  Chief Complaint:  3 month fu........SOB with and without exertion......Marland Kitchenreviewed meds...very tired.  History of Present Illness: Pulmonary OV    This is a 50 year old, white female with chronic pulmonary sarcoidosis.  This is been manifested by cyclic cough and mediastinal adenopathy with associated chest pain.  There are also stable pulmonary nodules.  The patient has been intolerant of systemic corticosteroids.  She also had adverse side effects to Arava(leflunamide).    On this basis we referred the patient in November 2008  to Dr. Azzie Roup,  for Remicade injections.  There has been a positive response to Rx.   September 24, 2008 9:47 AM No change since 9/09 OV On remecade q6wks and cont to improve.  CT Chest last Spring 09 showed resolution of Med LAN and reduction in size of pulm nodules. Less cough.  Refluxes with dietary indiscretion. Still dyspneic.  Pred down to 7.5mg  3/4 tab daily for past three weeks.       Current Allergies: ! METHOTREXATE  Past Medical History:    Reviewed history from 06/26/2008 and no changes required:       EDEMA (ICD-782.3)       HEADACHE (ICD-784.0)       MENOPAUSAL SYNDROME (ICD-627.2)       UTI (ICD-599.0)       SARCOIDOSIS (ICD-135)         -CT Chest 6/08 improvement in Mediastinal LAN         -11/08 start of remecade Rx per [facility]       PULMONARY NODULE (ICD-518.89)          -CT Chest 6/08 reduction in size of nodules due to Arava and Pred RX           -probable granulomas due to Sarcoidosis       HYPERTENSION (ICD-401.9)       HYPERLIPIDEMIA (ICD-272.4)       GERD (ICD-530.81)       DIABETES MELLITUS, TYPE II (ICD-250.00)       ANEMIA-NOS (ICD-285.9)             Review of Systems       The patient complains of hoarseness, chest pain, dyspnea on exertion, and severe indigestion/heartburn.  The patient denies anorexia, fever, syncope,  peripheral edema, prolonged cough, headaches, hemoptysis, muscle weakness, abnormal bleeding, enlarged lymph nodes, and angioedema.     Vital Signs:  Patient Profile:   50 Years Old Female Height:     66 inches Weight:      285 pounds BMI:     46.17 O2 Sat:      98 % O2 treatment:    Room Air Temp:     97.6 degrees F oral Pulse rate:   70 / minute BP sitting:   110 / 60  (left arm) Cuff size:   regular  Pt. in pain?   no  Vitals Entered By: Clarise Cruz Duncan Dull) (September 24, 2008 9:22 AM)                  Physical Exam  General:     cushingoid faciesobese.   Head:     normocephalic and atraumatic Eyes:     PERRLA/EOM intact; conjunctiva and sclera clear Ears:     TMs intact and clear with normal canals Nose:     no deformity, discharge, inflammation, or lesions Mouth:  no deformity or lesions Neck:     no masses, thyromegaly, or abnormal cervical nodes Chest Wall:     no deformities noted Lungs:     decreased BS bilateral.   Heart:     regular rate and rhythm, S1, S2 without murmurs, rubs, gallops, or clicks Abdomen:     bowel sounds positive; abdomen soft and non-tender without masses, or organomegaly Msk:     no deformity or scoliosis noted with normal posture Pulses:     pulses normal Extremities:     no clubbing, cyanosis, edema, or deformity noted Neurologic:     CN II-XII grossly intact with normal reflexes, coordination, muscle strength and tone Skin:     intact without lesions or rashes Cervical Nodes:     no significant adenopathy Axillary Nodes:     no significant adenopathy Psych:     alert and cooperative; normal mood and affect; normal attention span and concentration      Impression & Recommendations:  Problem # 1:  SARCOIDOSIS (ICD-135) Assessment: Improved  Severe Stage III sarcoidosis with associated airway inflammatory component plan:  cont remecade IV per Rheumatology cont ICS H1N1 vaccine today pred at 7.5mg   3/4 tab/d taper per Rheum rov three months  Problem # 2:  GERD (ICD-530.81) Assessment: Unchanged GERD will aggravate lower airway inflammation  doing well on current meds Her updated medication list for this problem includes:    Zegerid 40-1100 Mg Caps (Omeprazole-sodium bicarbonate) .[name]... 2 by mouth daily   Medications Added to Medication List This Visit: 1)  Prednisone 10 Mg Tabs (Prednisone) .... 7.5 mg take 3/4 tablet by mouth once a day and as directed 2)  Restasis 0.05 % Emul (Cyclosporine) .[name].. 1 drop each eye daily 3)  Fish Oil 500 Mg Caps (Omega-3 fatty acids) .... Once daily 4)  Lotemax 0.5 % Susp (Loteprednol etabonate) .[name].. 1 drop each eye every 6 hours as needed 5)  Utac 500-500 Mg Tabs (Methenamine mand-sod phosphate) .... Two times a day  Complete Medication List: 1)  Proair Hfa 108 (90 Base) Mcg/act Aers (Albuterol sulfate) .... 2 puffs three times a day as needed 2)  Atenolol-chlorthalidone 50-25 Mg Tabs (Atenolol-chlorthalidone) .... 1/1/2 daily 3)  Budeprion Xl 300 Mg Tb24 (Bupropion hcl) .... Take 1 tablet by mouth once a day 4)  Effexor Xr 75 Mg Cp24 (Venlafaxine hcl) .... Take 1 capsule by mouth once a day 5)  Lorazepam 1 Mg Tabs (Lorazepam) .... Prn 6)  Novolog Flexpen 100 Unit/ml Soln (Insulin aspart) .... Three times a day as directed 7)  Onetouch Ultra Test Strp (Glucose blood) .... Tid 8)  Potassium Chloride Crys Cr 20 Meq Tbcr (Potassium chloride crys cr) .... Take 1 tablet by mouth two times a day 9)  Prednisone 10 Mg Tabs (Prednisone) .... 7.5 mg take 3/4 tablet by mouth once a day and as directed 10)  Zegerid 40-1100 Mg Caps (Omeprazole-sodium bicarbonate) .... 2 by mouth daily 11)  Crestor 20 Mg Tabs (Rosuvastatin calcium) .[name].. 1 by mouth once daily 12)  Qvar 80 Mcg/act Aers (Beclomethasone dipropionate) .... Two puffs twice daily 13)  Pen Needles 31g X 8 Mm Misc (Insulin pen needle) .... Use as directed 14)  Remicade 100 Mg Solr (Infliximab) ....  Injection every 6 weeks 15)  Fluticasone Propionate 50 Mcg/act Susp (Fluticasone propionate) .... 2 sprays each nostril once daily 16)  Furosemide 20 Mg Tabs (Furosemide) .... Take one (1) by mouth daily 17)  Vicodin 5-500 Mg Tabs (Hydrocodone-acetaminophen) .Marland KitchenMarland KitchenMarland Kitchen  One tab every 6 hours as needed 18)  Vitamin D 1000 Unit Caps (Cholecalciferol) .... Two times a day 19)  Benicar 40 Mg Tabs (Olmesartan medoxomil) .[name].. 1 by mouth once daily 20)  Premarin 0.625 Mg Tabs (Estrogens conjugated) .... Take one tab by mouth once daily 21)  Slow Release Iron 47.5 Mg Cr-tabs (Ferrous sulfate) .... Once daily 22)  Multiple Vitamin Tabs (Multiple vitamin) .... Once daily 23)  Tussionex Pennkinetic Er 8-10 Mg/60ml Lqcr (Chlorpheniramine-hydrocodone) .... Take 1 teaspoon every 12 hrs as needed cough 24)  Restasis 0.05 % Emul (Cyclosporine) .[name].. 1 drop each eye daily 25)  Fish Oil 500 Mg Caps (Omega-3 fatty acids) .... Once daily 26)  Lotemax 0.5 % Susp (Loteprednol etabonate) .[name].. 1 drop each eye every 6 hours as needed 27)  Utac 500-500 Mg Tabs (Methenamine mand-sod phosphate) .... Two times a day  Other Orders: H1N1 vaccine (H4742) Influenza A (H1N1) w/ Phy couseling (V9563)   Patient Instructions: 1)  No changes in medications 2)  H1N1 vaccine today 3)  Return 3 months Flu Vaccine Consent Questions     Do you have a history of severe allergic reactions to this vaccine? no    Any prior history of allergic reactions to egg and/or gelatin? no    Do you have a sensitivity to the preservative Thimersol? no    Do you have a past history of Guillan-Barre Syndrome? no    Do you currently have an acute febrile illness? no    Have you ever had a severe reaction to latex? no    Vaccine information given and explained to patient? yes    Are you currently pregnant? no   Do you have Asthma? no   Lot Number: [id number]   Exp Date:04/09/2009   Site Given  Right Deltoid Michel Bickers CMA  September 24, 2008 12:51 PM       ]

## 2010-11-12 NOTE — Procedures (Signed)
Summary: EGD   EGD  Procedure date:  07/31/2002  Findings:      Location: [facility]  GERD Patient Name: [name], [name] MRN:  Procedure Procedures: Panendoscopy (EGD) CPT: 43235.    with esophageal dilation. CPT: G9296129.  Personnel: Endoscopist: Wilhemina Bonito. Marina Goodell, MD.  Exam Location: Exam performed in Outpatient Clinic. Outpatient  Patient Consent: Procedure, Alternatives, Risks and Benefits discussed, consent obtained, from patient. Consent was obtained by the RN.  Indications Symptoms: Dysphagia.  History  Current Medications: Patient is not currently taking Coumadin.  Pre-Exam Physical: Performed Sep 11, 2003  Entire physical exam was normal. Abnormal PE findings include: Obese.  Exam Exam Info: Maximum depth of insertion Duodenum, intended Duodenum. Patient position: on left side. Vocal cords visualized. Gastric retroflexion performed. Images taken. ASA Classification: II. Tolerance: excellent.  Sedation Meds: Patient assessed and found to be appropriate for moderate (conscious) sedation. Fentanyl 100 mcg. given IV. Versed 10 mg. given IV. Cetacaine Spray given aerosolized.  Monitoring: BP and pulse monitoring done. Oximetry used. Supplemental O2 given  Findings Normal: Distal Esophagus.  - Dilation: Cardia. for dysphagia . Maloney dilator used, Diameter: 54 F, No Resistance, No Heme present on extraction. 1  total dilators used. Patient tolerance excellent.  PRIOR SURGERY: Cardia. Anti-Reflux Surgery.   Assessment Normal examination.  Diagnoses: 530.81: GERD.   Events  Unplanned Intervention: No unplanned interventions were required.  Unplanned Events: There were no complications. Plans Instructions: Nothing to eat or drink for 1 hr.  Clear or full liquids: 2 hrs. Resume previous diet: am.  Medication(s): Continue current medications.  Disposition: After procedure patient sent to recovery. After recovery patient sent home.    Scheduling: Follow-up prn.   This report was created from the original endoscopy report, which was reviewed and signed by the above listed endoscopist.   cc:  Shan Levans, MD      Claud Kelp, MD

## 2010-11-12 NOTE — Assessment & Plan Note (Signed)
Vital Signs:  Patient Profile:   50 Years Old Female BP sitting:   118 / 76                  History of Present Illness: Vocal cord dysfunction---she seems to be doing quite well with very few paroxysms of coughing.  She has multiple lymph nodes in chest----followed by Dr. Delford Field       The patient comes in today for a Follow-up visit.  The patient complains of chest pain, but denies palpitations, dizziness, syncope, low blood sugar symptoms, high blood sugar symptoms, edema, SOB, DOE, PND, and orthopnea.  Since the last visit patient notes no new problems or concerns.  The patient admits to taking medications as prescribed and not monitoring BP.  When questioned about medication side effects, patient notes none.  Chest pain has been evaluated by CV, GI and pulmonary. Etiology is unclear but thought not to be cardiac  Diabetes Management History:      She is (or has been) enrolled in the "Diabetic Education Program".  She states understanding of dietary principles but she is not following the appropriate diet.  No sensory loss is reported.  Self foot exams are being performed.        Reported hypoglycemic symptoms include sweats and nausea.  No hyperglycemic symptoms are reported.        There are no symptoms to suggest diabetic complications.  Other questions/concerns include: DM meds followed by Dr. Everardo All.  Treatment plan changes were initiated by MD.    Hypertension History:      She denies headache, chest pain, palpitations, dyspnea with exertion, orthopnea, PND, peripheral edema, visual symptoms, neurologic problems, syncope, and side effects from treatment.        Positive major cardiovascular risk factors include diabetes, hyperlipidemia, and hypertension.  Negative major cardiovascular risk factors include female age less than 35 years old and non-tobacco-user status.      Past Medical History:    Reviewed history from 08/21/2006 and no changes required:       Anemia-NOS       Diabetes mellitus, type II       GERD       Hyperlipidemia       Hypertension       pulmonary nodule       OSA       Vocal Cord dysfunction with recurrent cough       Asthma vs Vocal cord dysfundtion  Past Surgical History:    Reviewed history from 08/21/2006 and no changes required:       Ventral Hernia Repair       Nissen Fundoplication       Cholecystectomy       Hysterectomy  Social History:    Reviewed history from 08/22/2006 and no changes required:       Married       Never Smoked  Physical Exam  General:     Well-developed,well-nourished,in no acute distress; alert,appropriate and cooperative throughout examination  see vitals and med sheet Head:     Normocephalic and atraumatic without obvious abnormalities. No apparent alopecia or balding. Mouth:     Oral mucosa and oropharynx without lesions or exudates.  Teeth in good repair. Neck:     No deformities, masses, or tenderness noted. Chest Wall:     No deformities, masses, or tenderness noted. Lungs:     Normal respiratory effort, chest expands symmetrically. Lungs are clear to auscultation, no crackles or  wheezes. Heart:     Normal rate and regular rhythm. S1 and S2 normal without gallop, murmur, click, rub or other extra sounds. Abdomen:     Bowel sounds positive,abdomen soft and non-tender without masses, organomegaly or hernias noted.  obese Msk:     No deformity or scoliosis noted of thoracic or lumbar spine.   Neurologic:     No cranial nerve deficits noted. Station and gait are normal. Plantar reflexes are down-going bilaterally. DTRs are symmetrical throughout. Sensory, motor and coordinative functions appear intact. Skin:     Intact without suspicious lesions or rashes Inguinal Nodes:     No significant adenopathy   Review of Systems  General      Complains of fatigue.  ENT      Denies difficulty swallowing.  CV      Complains of chest pain or discomfort.      Denies difficulty  breathing while lying down.  Resp      Denies coughing up blood and excessive snoring.  GI      Denies abdominal pain and bloody stools.  MS      Denies joint redness and muscle aches.  Derm      Denies dryness and flushing.  Neuro      Denies falling down and headaches.  Heme      Denies bleeding.  Impression & Recommendations:  Problem # 1:  DISEASE, VOCAL CORD NEC (ICD-478.5) She has done well Continue current meds  Problem # 2:  MEDIASTINAL LYMPHADENOPATHY (ICD-785.6) Has had follow up scans and she will continue to need f/u scans. She does not have a diagnosis. I have told her that the nodes have been stable and there is a very low likely hood of malignancy  Problem # 3:  DIABETES MELLITUS, TYPE II (ICD-250.00)  Labs Reviewed: HgBA1c: 5.3 (09/14/2006)   Creat: 0.9 (09/14/2006)        Problem # 4:  HYPERTENSION (ICD-401.9)  BP today: 118/76  10 Yr Risk Heart Disease: 8 %  Labs Reviewed: Creat: 0.9 (09/14/2006) Chol: 148 (09/14/2006)   HDL: 33.7 (09/14/2006)   LDL: 81 (09/14/2006)   TG: 168 (09/14/2006)   Diabetes Management Assessment/Plan:      The following lipid goals have been established for the patient: Total cholesterol goal of 200; LDL cholesterol goal of 100; HDL cholesterol goal of 40; Triglyceride goal of 200.    Hypertension Assessment/Plan:      The patient's hypertensive risk group is category C: Target organ damage and/or diabetes.  Her calculated 10 year risk of coronary heart disease is 8 %.  Today's blood pressure is 118/76.    Patient Instructions: 1)  Please schedule a follow-up appointment in 3 months.

## 2010-11-12 NOTE — Letter (Signed)
Summary: Patient [facility] Biopsy Results  [facility]  [address] [place], [place] [address]   Phone: [phone]  Fax: [phone]        June 05, 2009 MRN: [id number]    [facility] [address] [place], [place]  [address]    Dear Ms. Bradway,  I am pleased to inform you that the biopsies taken during your recent endoscopic examination revealed only a benign stomach polyp.   Additional information/recommendations:  __No further action is needed at this time.  Please follow-up with      your primary care physician for your other healthcare needs.     Please call us if you are having persistent problems or have questions about your condition that have not been fully answered at this time.  Sincerely,  Hilarie Fredrickson MD  This letter has been electronically signed by your physician.

## 2010-11-12 NOTE — Procedures (Signed)
Summary: Upper Endoscopy  Patient: [name] Note: All result statuses are Final unless otherwise noted.  Tests: (1) Upper Endoscopy (EGD)   EGD Upper Endoscopy       DONE (C)     [facility]     [address]     [place], [place]  [address]           ENDOSCOPY PROCEDURE REPORT           PATIENT:  [name], [name]  MR#:  [id number]     BIRTHDATE:  [birth year], 49 yrs. old  GENDER:  female           ENDOSCOPIST:  Wilhemina Bonito. Eda Keys, MD     Referred by:  Office           PROCEDURE DATE:  08/11/2010     PROCEDURE:  EGD, diagnostic 27253     ASA CLASS:  Class III     INDICATIONS:  chest pain           MEDICATIONS:   Fentanyl 50 mcg IV, Versed 5 mg IV, Benadryl 25 mg     IV     TOPICAL ANESTHETIC:  Exactacain Spray           DESCRIPTION OF PROCEDURE:   After the risks benefits and     alternatives of the procedure were thoroughly explained, informed     consent was obtained.  The LB [id number]-H180 [id number] endoscope was     introduced through the mouth and advanced to the second portion of     the duodenum, without limitations.  The instrument was slowly     withdrawn as the mucosa was fully examined.     <<PROCEDUREIMAGES>>           The upper, middle, and distal third of the esophagus were     carefully inspected and no abnormalities were noted. The z-line     was well seen at the GEJ. The endoscope was pushed into the fundus     which was normal including a retroflexed view. The antrum,gastric     body, first and second part of the duodenum were unremarkable.     Retroflexed views revealed prior fundoplication.    The scope was then     withdrawn from the patient and the procedure completed.           COMPLICATIONS:  None           ENDOSCOPIC IMPRESSION:     1) Normal EGD     2) S/p fundoplication     RECOMMENDATIONS:     1) Return to the care of Dr Cato Mulligan for furhter management of     noncardiac / nonGI chest pain.           ____________________________  Wilhemina Bonito. Eda Keys, MD           CC:  Lindley Magnus, MD, The Patient, Storm Frisk, MD           n.     REVISED:  08/11/2010 12:06 PM     eSIGNED:   Wilhemina Bonito. Eda Keys at 08/11/2010 12:06 PM           Waldo Laine, [id number]  Note: An exclamation mark (!) indicates a result that was not dispersed into the flowsheet. Document Creation Date: 08/11/2010 12:06 PM _______________________________________________________________________  (1) Order result status: Final Collection or observation date-time: 08/11/2010 11:11 Requested date-time:  Receipt date-time:  Reported date-time:  Referring Physician:   Ordering Physician: Fransico Setters [phone]) Specimen Source:  Source: Launa Grill Order Number: [id number] Lab site:

## 2010-11-12 NOTE — Medication Information (Signed)
Summary: Prior Authorization Request & Approval for Zegerid/[employer]  Prior Authorization Request & Approval for Zegerid/[employer]   Imported By: Maryln Gottron 01/08/2009 12:15:56  _____________________________________________________________________  External Attachment:    Type:   Image     Comment:   External Document

## 2010-11-12 NOTE — Assessment & Plan Note (Signed)
Summary: Pulmonary OV   Copy to:  Adella Hare Primary Provider/Referring Provider:  Leeanne Deed  CC:  2 mo f/u.  Pt states breathing is a little better however still having SOB while outside or when walking a lot.  c/o dry cough x2days.[name]  History of Present Illness:  50 year old, white female with chronic pulmonary sarcoidosis.  This is been manifested by cyclic cough and mediastinal adenopathy with associated chest pain.  There are also stable pulmonary nodules.  The patient has been intolerant of systemic corticosteroids.  She also had adverse side effects to Arava(leflunamide).    On this basis we referred the patient in November 2008  to Dr. Azzie Roup,  for Remicade injections.  There has been a positive response to Rx.  December 18, 2008-now on pred 5/d  on remecade every 6weeks The pts cough is better.  There is some chest pain.  Pt notes some sinus pressure.  Some postnasal drip.  May 01, 2009 --Presents for an acute office visit. Admitted 04/27/2009- 04/30/2009 for  Sarcoidosis with acute exacerbation.   She was admitted w/ increased cough, dyspnea and weakness. CT chestt was     negative for PE and showed no intrathoracic abnormalities.   Sputum cx was neg. She was treated w/ steorids, aggressive pulmonary toilet. She did have a lactic acidosis.  w/ lactic acid level as high as 8, last check showed at 2.6. Felt to be from r hyperventilation at the time of admission. She did improve but cough and tightness did not totally resolve. Today she says she has been having dry cough. Discharge summary says pred 10mg  however she is on 5mg  once daily. Dyspnea is worse in heat (temps >90 outside). Denies orthopnea, hemoptysis, fever, n/v/d, edema.  Pain on inspiration and with cough.     May 07, 2009 10:03  at last ov with NP pt was seen post hosp: Stop fish oil.  Change QVAR to Symbicort 160.4.75mcg 2 puffs two times a day (brush, rinse and gargle after use) Increase Prednisone 20mg   once daily for 1 week, then hold at 10mg  once daily until next office viist.  Mucinex DM two times a day as needed cough/congestion Still on prednisone 20mg /d.  Still with dry cough.  Still with heartburn.  Has dysphagia and hoarseness. No loose stools.  Still dyspneic at rest.   May 30, 2009 10:31 AM Still with cough that is dry.  Cough is much less than one month ago with hosp admit. To have endo on 8/24 Still with breakthrough heartburn Pt denies any significant sore throat, nasal congestion or excess secretions, fever, chills, sweats, unintended weight loss, pleurtic or exertional chest pain, orthopnea PND, or leg swelling Pt denies any increase in rescue therapy over baseline, denies waking up needing it or having any early am or nocturnal exacerbations of coughing/wheezing/or dyspnea. There are not any alleviating or precipitating factors noted.  The symptoms do not generally fluctuate.   Preventive Screening-Counseling & Management  Alcohol-Tobacco     Smoking Status: never  Current Medications (verified): 1)  Atenolol-Chlorthalidone 50-25 Mg Tabs (Atenolol-Chlorthalidone) .... Take 1 Tablet By Mouth Once A Day 2)  Budeprion Xl 300 Mg Tb24 (Bupropion Hcl) .... Take 1 Tablet By Mouth Once A Day 3)  Effexor Xr 75 Mg Cp24 (Venlafaxine Hcl) .... Take 1 Capsule By Mouth Two Times A Day 4)  Lorazepam 1 Mg Tabs (Lorazepam) .... One Every 6 Hrs As Needed 5)  Novolog Flexpen 100 Unit/ml Soln (Insulin Aspart) .... Three  Times A Day As Directed 6)  Onetouch Ultra Test  Strp (Glucose Blood) .... Tid 7)  Potassium Chloride Crys Cr 20 Meq Tbcr (Potassium Chloride Crys Cr) .[name].. 1 Three Times A Day 8)  Prednisone 10 Mg Tabs (Prednisone) .[name].. 1 By Mouth Two Times A Day 9)  Zegerid 40-1100 Mg Caps (Omeprazole-Sodium Bicarbonate) .... One Twice Daily 10)  Crestor 20 Mg Tabs (Rosuvastatin Calcium) .[name].. 1 By Mouth Once Daily 11)  Pen Needles 31g X 8 Mm  Misc (Insulin Pen Needle) .... Use As  Directed 12)  Remicade 100 Mg  Solr (Infliximab) .... Injection Every 6 Weeks 13)  Fluticasone Propionate 50 Mcg/act  Susp (Fluticasone Propionate) .... 2 Sprays Each Nostril Once Daily 14)  Furosemide 20 Mg Tabs (Furosemide) .... Take One (1) By Mouth Daily 15)  Vicodin 5-500 Mg  Tabs (Hydrocodone-Acetaminophen) .... One Tab Every 6 Hours As Needed 16)  Vitamin D 1000 Unit  Caps (Cholecalciferol) .... Two Times A Day 17)  Estrace 1 Mg Tabs (Estradiol) .... Take 1 Tablet By Mouth Once A Day 18)  Slow Release Iron 47.5 Mg Cr-Tabs (Ferrous Sulfate) .... Once Daily 19)  Multiple Vitamin  Tabs (Multiple Vitamin) .... Once Daily 20)  Tussionex Pennkinetic Er 8-10 Mg/2ml Lqcr (Chlorpheniramine-Hydrocodone) .... Take 1 Teaspoon Every 12 Hrs As Needed Cough 21)  Restasis 0.05 % Emul (Cyclosporine) .[name].. 1 Drop Each Eye Daily 22)  Lotemax 0.5 % Susp (Loteprednol Etabonate) .[name].. 1 Drop Each Eye Every 6 Hours As Needed 23)  Utac 500-500 Mg Tabs (Methenamine Mand-Sod Phosphate) .... Two Times A Day 24)  Zantac 150 Mg Tabs (Ranitidine Hcl) .... One Daily 25)  Tessalon Perles 100 Mg  Caps (Benzonatate) .... One To Two By Mouth 3-4 Times Daily  Allergies (verified): 1)  ! Methotrexate  Past History:  Past medical, surgical, family and social histories (including risk factors) reviewed, and no changes noted (except as noted below).  Past Medical History: Reviewed history from 04/21/2009 and no changes required. EDEMA (ICD-782.3) HEADACHE (ICD-784.0) MENOPAUSAL SYNDROME (ICD-627.2) UTI (ICD-599.0) SARCOIDOSIS (ICD-135)   -CT Chest 6/08 improvement in Mediastinal LAN   -11/08 start of remecade Rx per [facility] PULMONARY NODULE (ICD-518.89)    -CT Chest 6/08 reduction in size of nodules due to Arava and Pred RX     -probable granulomas due to Sarcoidosis HYPERTENSION (ICD-401.9) HYPERLIPIDEMIA (ICD-272.4) GERD (ICD-530.81) Sleep APnea DIABETES MELLITUS, TYPE II  (ICD-250.00) ANEMIA-NOS (ICD-285.9)    Past Surgical History: Reviewed history from 08/21/2006 and no changes required. Ventral Hernia Repair Nissen Fundoplication Cholecystectomy Hysterectomy  Family History: Reviewed history from 05/12/2009 and no changes required. father-alive MI, DM mother alive and well uncle with SARCOID No FH of Colon Cancer:  Social History: Reviewed history from 05/12/2009 and no changes required. Married Never Smoked Regular exercise-no Alcohol use-no Illicit Drug Use - no  Review of Systems       The patient complains of shortness of breath with activity, non-productive cough, acid heartburn, indigestion, and joint stiffness or pain.  The patient denies shortness of breath at rest, productive cough, coughing up blood, chest pain, irregular heartbeats, loss of appetite, weight change, abdominal pain, difficulty swallowing, sore throat, tooth/dental problems, headaches, nasal congestion/difficulty breathing through nose, sneezing, itching, ear ache, anxiety, depression, hand/feet swelling, rash, change in color of mucus, and fever.    Vital Signs:  Patient profile:   50 year old female Height:      66 inches Weight:  276.25 pounds BMI:     44.75 O2 Sat:      96 % on Room air Temp:     98.0 degrees F oral Pulse rate:   72 / minute BP sitting:   116 / 72  (left arm) Cuff size:   large  Vitals Entered By: Gweneth Dimitri RN (May 30, 2009 10:14 AM)  O2 Flow:  Room air CC: 2 mo f/u.  Pt states breathing is a little better however still having SOB while outside or when walking a lot.  c/o dry cough x2days. Comments Medications reviewed with patient Gweneth Dimitri RN  May 30, 2009 10:14 AM    Physical Exam  Additional Exam:   GEN: A/Ox3; pleasant , NAD, obese female.  HEENT:  [place]/AT, , EACs-clear, TMs-wnl, NOSE-clear, THROAT-clear NECK:  Supple w/ fair ROM; no JVD; normal carotid impulses w/o bruits; no thyromegaly or nodules palpated;  no lymphadenopathy. RESP  Coarse BS w/ no wheeizng, has some mild upper airway psuedowheezing on forced exp.  CARD:  RRR, no m/r/g   GI:   Soft & nt; nml bowel sounds; no organomegaly or masses detected. [place]: Warm bil,  no calf tenderness edema, clubbing, pulses intact Neuro: intact w/ no focal deficits.    Impression & Recommendations:  Problem # 1:  GERD (ICD-530.81) Assessment Unchanged Severe GERD ppt cyclic cough plan  per GI The following medications were removed from the medication list:    Zantac 150 Mg Tabs (Ranitidine hcl) .[name]... 1 by mouth once daily Her updated medication list for this problem includes:    Zegerid 40-1100 Mg Caps (Omeprazole-sodium bicarbonate) ..... One twice daily    Zantac 150 Mg Tabs (Ranitidine hcl) ..... One daily  Problem # 2:  SARCOIDOSIS (ICD-135) Assessment: Unchanged stable pulmonary sarcoidosis plan cont low dose pred, taper dose down remecade q6weeks  Medications Added to Medication List This Visit: 1)  Lorazepam 1 Mg Tabs (Lorazepam) .... One every 6 hrs as needed 2)  Prednisone 10 Mg Tabs (Prednisone) .[name].. 1 in am   1/2 in pm  Complete Medication List: 1)  Atenolol-chlorthalidone 50-25 Mg Tabs (Atenolol-chlorthalidone) .... Take 1 tablet by mouth once a day 2)  Budeprion Xl 300 Mg Tb24 (Bupropion hcl) .... Take 1 tablet by mouth once a day 3)  Effexor Xr 75 Mg Cp24 (Venlafaxine hcl) .... Take 1 capsule by mouth two times a day 4)  Lorazepam 1 Mg Tabs (Lorazepam) .... One every 6 hrs as needed 5)  Novolog Flexpen 100 Unit/ml Soln (Insulin aspart) .... Three times a day as directed 6)  Onetouch Ultra Test Strp (Glucose blood) .... Tid 7)  Potassium Chloride Crys Cr 20 Meq Tbcr (Potassium chloride crys cr) .[name].. 1 three times a day 8)  Prednisone 10 Mg Tabs (Prednisone) .[name].. 1 in am   1/2 in pm 9)  Zegerid 40-1100 Mg Caps (Omeprazole-sodium bicarbonate) .... One twice daily 10)  Crestor 20 Mg Tabs (Rosuvastatin calcium) .[name].. 1 by  mouth once daily 11)  Pen Needles 31g X 8 Mm Misc (Insulin pen needle) .... Use as directed 12)  Remicade 100 Mg Solr (Infliximab) .... Injection every 6 weeks 13)  Fluticasone Propionate 50 Mcg/act Susp (Fluticasone propionate) .... 2 sprays each nostril once daily 14)  Furosemide 20 Mg Tabs (Furosemide) .... Take one (1) by mouth daily 15)  Vicodin 5-500 Mg Tabs (Hydrocodone-acetaminophen) .... One tab every 6 hours as needed 16)  Vitamin D 1000 Unit Caps (Cholecalciferol) .... Two times a day 17)  Estrace 1 Mg Tabs (Estradiol) .... Take 1 tablet by mouth once a day 18)  Slow Release Iron 47.5 Mg Cr-tabs (Ferrous sulfate) .... Once daily 19)  Multiple Vitamin Tabs (Multiple vitamin) .... Once daily 20)  Tussionex Pennkinetic Er 8-10 Mg/57ml Lqcr (Chlorpheniramine-hydrocodone) .... Take 1 teaspoon every 12 hrs as needed cough 21)  Restasis 0.05 % Emul (Cyclosporine) .[name].. 1 drop each eye daily 22)  Lotemax 0.5 % Susp (Loteprednol etabonate) .[name].. 1 drop each eye every 6 hours as needed 23)  Utac 500-500 Mg Tabs (Methenamine mand-sod phosphate) .... Two times a day 24)  Zantac 150 Mg Tabs (Ranitidine hcl) .... One daily 25)  Tessalon Perles 100 Mg Caps (Benzonatate) .... One to two by mouth 3-4 times daily  Other Orders: Est. Patient Level III (56387)  Patient Instructions: 1)  Reduce prednisone to 15mg  daily 2)  Return two months Prescriptions: LORAZEPAM 1 MG TABS (LORAZEPAM) one every 6 hrs as needed  #60 x 1   Entered and Authorized by:   Storm Frisk MD   Signed by:   Storm Frisk MD on 05/30/2009   Method used:   Print then Give to Patient   RxID:   [id number]

## 2010-11-12 NOTE — Letter (Signed)
Summary: Generic Letter  [facility]  [address] [place], [place] [address]   Phone: [phone]  Fax: [phone]    09/22/2009  [name] [address] [place], [place]  [address]  Dear [name]:  [name] and was evaluated today for an acute medical problem.  She is being treated for acute cellulitis involving right foot and is on antibiotic therapy.  It is recommended that she has stay off her feet and keep right leg elevated as much as possible for the next few days. Due to her present medical status, it is respectably requested that she be excused from her jury service and postponed to a later time.       Sincerely,   [name]  MD

## 2010-11-12 NOTE — Consult Note (Signed)
Summary: [facility]  [facility]   Imported By: Kassie Mends 06/06/2008 11:27:20  _____________________________________________________________________  External Attachment:    Type:   Image     Comment:   [facility]

## 2010-11-12 NOTE — Progress Notes (Signed)
Summary: yeast infection  Phone Note Call from Patient Call back at [phone]   Caller: Patient Call For: Birdie Sons MD Summary of Call: Pt requesting prescription for a yeast infection. Pt has been on antibiotics. [facility] [address] is her pharmacy. Initial call taken by: Verdell Face,  September 08, 2009 4:38 PM  Follow-up for Phone Call        Rx done.  left message on personal voice mail. Follow-up by: Gladis Riffle, RN,  September 08, 2009 4:52 PM    New/Updated Medications: DIFLUCAN 100 MG TABS (FLUCONAZOLE) Take two now x one dose and then Take 1 tablet by mouth once a day until bottle empty Prescriptions: DIFLUCAN 100 MG TABS (FLUCONAZOLE) Take two now x one dose and then Take 1 tablet by mouth once a day until bottle empty  #7 x 0   Entered by:   Gladis Riffle, RN   Authorized by:   Birdie Sons MD   Signed by:   Gladis Riffle, RN on 09/08/2009   Method used:   Electronically to        [facility] (retail)       [address]       [place], [place]  [address]       Ph: [phone]       Fax: [phone]   RxID:   [id number]

## 2010-11-12 NOTE — Progress Notes (Signed)
Summary: wants appt  Phone Note Call from Patient Call back at [phone]   Caller: Patient-live call Call For: dr swords Summary of Call: Wants to see Dr swords tomorrow. Has a cough and congestion. Please advise. Initial call taken by: Warnell Forester,  December 30, 2008 2:34 PM  Follow-up for Phone Call        No appts available, do you want to see her or send her to another provider? Follow-up by: Gladis Riffle, RN,  December 30, 2008 2:45 PM  Additional Follow-up for Phone Call Additional follow up Details #1::        see me at open appt tomorrow 11:15 Additional Follow-up by: Birdie Sons MD,  December 30, 2008 3:40 PM    Additional Follow-up for Phone Call Additional follow up Details #2::    Patient notified. Appt made for 11:15 on 3/23 Follow-up by: Gladis Riffle, RN,  December 30, 2008 4:01 PM

## 2010-11-12 NOTE — Procedures (Signed)
Summary: EGD  [View Reports-CCC] Patient Name: [name], [name] MRN:  Procedure Procedures: Panendoscopy (EGD) CPT: 43235.    with esophageal dilation. CPT: G9296129.  Personnel: Endoscopist: Wilhemina Bonito. Marina Goodell, MD.  Exam Location: Exam performed in Outpatient Clinic. Outpatient  Patient Consent: Procedure, Alternatives, Risks and Benefits discussed, consent obtained, from patient. Consent was obtained by the RN.  Indications Symptoms: Dysphagia.  History  Current Medications: Patient is not currently taking Coumadin.  Pre-Exam Physical: Performed Sep 11, 2003  Entire physical exam was normal. Abnormal PE findings include: Obese.  Exam Exam Info: Maximum depth of insertion Duodenum, intended Duodenum. Patient position: on left side. Vocal cords visualized. Gastric retroflexion performed. Images taken. ASA Classification: II. Tolerance: excellent.  Sedation Meds: Patient assessed and found to be appropriate for moderate (conscious) sedation. Fentanyl 100 mcg. given IV. Versed 10 mg. given IV. Cetacaine Spray given aerosolized.  Monitoring: BP and pulse monitoring done. Oximetry used. Supplemental O2 given  Findings Normal: Distal Esophagus.  - Dilation: Cardia. for dysphagia . Maloney dilator used, Diameter: 54 F, No Resistance, No Heme present on extraction. 1  total dilators used. Patient tolerance excellent.  PRIOR SURGERY: Cardia. Anti-Reflux Surgery.   Assessment Normal examination.  Diagnoses: 530.81: GERD.   Events  Unplanned Intervention: No unplanned interventions were required.  Unplanned Events: There were no complications. Plans Instructions: Nothing to eat or drink for 1 hr.  Clear or full liquids: 2 hrs. Resume previous diet: am.  Medication(s): Continue current medications.  Disposition: After procedure patient sent to recovery. After recovery patient sent home.  Scheduling: Follow-up prn.   This report was created from the original endoscopy  report, which was reviewed and signed by the above listed endoscopist.   cc: Birdie Sons, MD     Shan Levans, MD     Claud Kelp, MD     The Patient

## 2010-11-12 NOTE — Letter (Signed)
Summary: [facility]  [facility]   Imported By: Maryln Gottron 11/01/2008 15:07:59  _____________________________________________________________________  External Attachment:    Type:   Image     Comment:   External Document

## 2010-11-12 NOTE — Assessment & Plan Note (Signed)
Summary: Pulmonary OV   Referred by:  Chase Picket PCP:  Dr. Birdie Sons  Chief Complaint:  dry cough, wheezing, and increased SOB x2 days.  History of Present Illness: Pulmonary OV  01/16/08:  This is a 50 year old, white female with chronic pulmonary sarcoidosis.  This is been manifested by cyclic cough and mediastinal adenopathy with associated chest pain.  There are also stable pulmonary nodules.  The patient has been intolerant of systemic corticosteroids.  She also had adverse side effects to Arava(leflunamide).    On this basis we referred the patient in November to Dr. Chase Picket, for Remicade injections.  Since initiating these injections in November, the patient now is seeing improvement.  There is less cough.  There is left shoulder and joint pain.  She does note increased lower extremity edema.  Her shortness of breath is less.    9/16: last ov 01/16/08: at last ov: We reduced pred to 10mg  per day.  She remains on remecade and doing well.  Now on chronic macrodantin for chronic uti.   Pt denies any significant sore throat, nasal congestion or excess secretions, fever, chills, sweats, unintended weight loss, pleurtic or exertional chest pain, orthopnea PND, or leg swelling Pt denies any increase in rescue therapy over baseline, denies waking up needing it or having any early am or nocturnal exacerbations of coughing/wheezing/or dyspnea. Pt also denies any obvious fluctuation in symptoms wiht weather or environmental change or other alleviating or aggravating factors       Prior Medications Reviewed Using: List Brought by Patient  Updated Prior Medication List: PROAIR HFA 108 (90 BASE) MCG/ACT  AERS (ALBUTEROL SULFATE) 2 puffs three times a day as needed ATENOLOL-CHLORTHALIDONE 50-25 MG TABS (ATENOLOL-CHLORTHALIDONE) 1/1/2 daily BUDEPRION XL 300 MG TB24 (BUPROPION HCL) Take 1 tablet by mouth once a day EFFEXOR XR 75 MG CP24 (VENLAFAXINE HCL) Take 1 capsule by mouth once a  day LORAZEPAM 1 MG TABS (LORAZEPAM) prn NOVOLOG FLEXPEN 100 UNIT/ML SOLN (INSULIN ASPART) three times a day as directed ONETOUCH ULTRA TEST  STRP (GLUCOSE BLOOD) tid POTASSIUM CHLORIDE CRYS CR 20 MEQ TBCR (POTASSIUM CHLORIDE CRYS CR) Take 1 tablet by mouth two times a day PREDNISONE 10 MG  TABS (PREDNISONE) Take 1 tablet by mouth once a day ZEGERID 40-1100 MG CAPS (OMEPRAZOLE-SODIUM BICARBONATE) 2 by mouth daily CRESTOR 20 MG TABS (ROSUVASTATIN CALCIUM) 1 by mouth once daily QVAR 80 MCG/ACT  AERS (BECLOMETHASONE DIPROPIONATE) two puffs twice daily PEN NEEDLES 31G X 8 MM  MISC (INSULIN PEN NEEDLE) USE AS DIRECTED REMICADE 100 MG  SOLR (INFLIXIMAB) Injection every 6 weeks FLUTICASONE PROPIONATE 50 MCG/ACT  SUSP (FLUTICASONE PROPIONATE) 2 sprays each nostril once daily FUROSEMIDE 20 MG TABS (FUROSEMIDE) Take one (1) by mouth daily VICODIN 5-500 MG  TABS (HYDROCODONE-ACETAMINOPHEN) One tab every 6 hours as needed VITAMIN D 1000 UNIT  CAPS (CHOLECALCIFEROL) two times a day BENICAR 40 MG  TABS (OLMESARTAN MEDOXOMIL) 1 by mouth once daily PREMARIN 0.625 MG TABS (ESTROGENS CONJUGATED) take one tab by mouth once daily SLOW RELEASE IRON 47.5 MG CR-TABS (FERROUS SULFATE) once daily MULTIPLE VITAMIN  TABS (MULTIPLE VITAMIN) once daily TUSSIONEX PENNKINETIC ER 8-10 MG/5ML LQCR (CHLORPHENIRAMINE-HYDROCODONE) as needed MACRODANTIN 100 MG CAPS (NITROFURANTOIN MACROCRYSTAL) Take 1 capsule by mouth at bedtime  Current Allergies: ! METHOTREXATE  Past Medical History:    Reviewed history from 03/27/2008 and no changes required:       EDEMA (ICD-782.3)       HEADACHE (ICD-784.0)  MENOPAUSAL SYNDROME (ICD-627.2)       UTI (ICD-599.0)       SARCOIDOSIS (ICD-135)       PULMONARY NODULE (ICD-518.89)       HYPERTENSION (ICD-401.9)       HYPERLIPIDEMIA (ICD-272.4)       GERD (ICD-530.81)       DIABETES MELLITUS, TYPE II (ICD-250.00)       ANEMIA-NOS (ICD-285.9)             Review of Systems       See HPI   Vital Signs:  Patient Profile:   50 Years Old Female Height:     66 inches Weight:      298.13 pounds O2 Sat:      97 % O2 treatment:    Room Air Temp:     97.7 degrees F Pulse rate:   79 / minute BP sitting:   120 / 64  (left arm) Cuff size:   large  Vitals Entered By: Boone Master CNA (June 26, 2008 1:30 PM)             Is Patient Diabetic? Yes  Comments Medications reviewed with patient Boone Master CNA  June 26, 2008 1:33 PM      Physical Exam  Gen: obese cushingoid WF     in NAD    NCAT Heent:  no jvd, no TMG, no cervical LNademopathy, orophyx clear,  nares with clear watery drainage. Cor: RRR nl s1/s2  no s3/s4  no m r h g Abd: soft NT BSA   no masses  No HSM  no rebound or guarding Ext perfused with no c v e v.d Neuro: intact, moves all 4s, CN II-XII intact, DTRs intact Chest: distant BS  no wheezes, rales, rhonchi   no egophony  no consolidative breath sounds, mild hyperresonance to percussion Skin: clear  Genital/Rectal :deferred    WHITE CELL COUNT     [HH] 19.5 K/uL                   4.5-10.5     RECHECKED CALLED TO MEGAN@ 2:50 ON 06/26/08 BY HINSHAW T   RED CELL COUNT       [L]  3.72 Mil/uL                 3.87-5.11   HEMOGLOBIN                12.6 g/dL                   16.1-09.6   HEMATOCRIT           [L]  35.6 %                      36.0-46.0   MCV                       95.6 fl                     78.0-100.0   MCHC                      35.5 g/dL                   04.5-40.9   RDW                       12.8 %  11.5-14.6   PLATELET COUNT            344 K/uL                    150-400   NEUTROPHIL %              76.9 %                      43.0-77.0     CONFIRMED BY MANUAL SMEAR   LYMPHOCYTE %              18.8 %                      12.0-46.0   MONOCYTE %                3.0 %                       3.0-12.0   EOSINOPHILS %             1.3 %                       0.0-5.0   BASOPHILS %               0.0 %                        0.0-3.0  NEUTROPHILS, ABSOLUTE                        [H]  14.9 K/uL                   1.4-7.7   MONOCYTES, ABSOLUTE       0.6 K/uL                    0.1-1.0  EOSINOPHILS, ABSOLUTE                             0.3 K/uL                    0.0-0.7   BASOPHILS, ABSOLUTE       0.0 K/uL                    0.0-0.1    Problem # 1:  SARCOIDOSIS (ICD-135) Assessment: Improved Severe Stage III sarcoidosis with associated airway inflammatory component plan:  cont remecade IV per Rheumatology cont ICS Flu vaccine today rov three months  Medications Added to Medication List This Visit: 1)  Proair Hfa 108 (90 Base) Mcg/act Aers (Albuterol sulfate) .... 2 puffs three times a day as needed 2)  Macrodantin 100 Mg Caps (Nitrofurantoin macrocrystal) .... Take 1 capsule by mouth at bedtime  Complete Medication List: 1)  Proair Hfa 108 (90 Base) Mcg/act Aers (Albuterol sulfate) .... 2 puffs three times a day as needed 2)  Atenolol-chlorthalidone 50-25 Mg Tabs (Atenolol-chlorthalidone) .... 1/1/2 daily 3)  Budeprion Xl 300 Mg Tb24 (Bupropion hcl) .... Take 1 tablet by mouth once a day 4)  Effexor Xr 75 Mg Cp24 (Venlafaxine hcl) .... Take 1 capsule by mouth once a day 5)  Lorazepam 1 Mg Tabs (Lorazepam) .... Prn 6)  Novolog Flexpen 100 Unit/ml Soln (Insulin aspart) .... Three times a day as directed 7)  Onetouch  Ultra Test Strp (Glucose blood) .... Tid 8)  Potassium Chloride Crys Cr 20 Meq Tbcr (Potassium chloride crys cr) .... Take 1 tablet by mouth two times a day 9)  Prednisone 10 Mg Tabs (Prednisone) .... Take 1 tablet by mouth once a day 10)  Zegerid 40-1100 Mg Caps (Omeprazole-sodium bicarbonate) .... 2 by mouth daily 11)  Crestor 20 Mg Tabs (Rosuvastatin calcium) .[name].. 1 by mouth once daily 12)  Qvar 80 Mcg/act Aers (Beclomethasone dipropionate) .... Two puffs twice daily 13)  Pen Needles 31g X 8 Mm Misc (Insulin pen needle) .... Use as directed 14)  Remicade 100 Mg Solr  (Infliximab) .... Injection every 6 weeks 15)  Fluticasone Propionate 50 Mcg/act Susp (Fluticasone propionate) .... 2 sprays each nostril once daily 16)  Furosemide 20 Mg Tabs (Furosemide) .... Take one (1) by mouth daily 17)  Vicodin 5-500 Mg Tabs (Hydrocodone-acetaminophen) .... One tab every 6 hours as needed 18)  Vitamin D 1000 Unit Caps (Cholecalciferol) .... Two times a day 19)  Benicar 40 Mg Tabs (Olmesartan medoxomil) .[name].. 1 by mouth once daily 20)  Premarin 0.625 Mg Tabs (Estrogens conjugated) .... Take one tab by mouth once daily 21)  Slow Release Iron 47.5 Mg Cr-tabs (Ferrous sulfate) .... Once daily 22)  Multiple Vitamin Tabs (Multiple vitamin) .... Once daily 23)  Tussionex Pennkinetic Er 8-10 Mg/65ml Lqcr (Chlorpheniramine-hydrocodone) .... As needed 24)  Macrodantin 100 Mg Caps (Nitrofurantoin macrocrystal) .... Take 1 capsule by mouth at bedtime   Patient Instructions: 1)  No change in medications 2)  Return three months 3)  Flu vaccine today 4)  labs today   ]  Influenza Vaccine    Vaccine Type: Fluvax 3+    Site: left deltoid    Mfr: [employer]    Dose: 0.5 ml    Route: IM    Given by: Boone Master CNA    Exp. Date: 04/09/2009    Lot #: [id number]    VIS given: 05/04/07 version given June 26, 2008.  Flu Vaccine Consent Questions    Do you have a history of severe allergic reactions to this vaccine? no    Any prior history of allergic reactions to egg and/or gelatin? no    Do you have a sensitivity to the preservative Thimersol? no    Do you have a past history of Guillan-Barre Syndrome? no    Do you currently have an acute febrile illness? no    Have you ever had a severe reaction to latex? no    Vaccine information given and explained to patient? yes    Are you currently pregnant? no  Appended Document: Pulmonary OV fax tom rowe

## 2010-11-12 NOTE — Assessment & Plan Note (Signed)
Summary: Pulmonary OV   Copy to:  Dr. Delford Field Primary Provider/Referring Provider:  Birdie Sons, MD   CC:  1 month follow up.  Pt states breathing is doing well overall.  Nonprod cough at times.  Denies SOB and wheezing.  [name]  History of Present Illness: 50 year old, white female with chronic pulmonary sarcoidosis.  This is been manifested by cyclic cough and mediastinal adenopathy with associated chest pain.  There are also stable pulmonary nodules.  She has been intolerant of systemic corticosteroids &  had adverse side effects to Arava(leflunamide). She was referred the patient in November 2008  to Dr. Azzie Roup,  for Remicade injections.  There has been a positive response to Rx. She had severe GERD s/p fundoplication in 2001.  June 17, 2010 3:50 PM The pt started back coughing and  having difficulty breathing.  The pt notes some heartburn. The pt has not been compliant with reflux diet.  Pt notes upper airway wheeze. There is some chest pain ,  the pt is dyspneic.    August 04, 2010 2:53 PM Hosp 10/5 - 10/10 and cath was neg.  Had chest pain.  The pain radiated into L Cardiac cath  was clean.  No CAD. Pt notes symptoms are  still present.   the pt went back to ED 10/22.  K2.9 .  Pt still with cramping in chest,  Pt c/o dyspnea as well.  Pt still wiht some dysphagia.  D dimer was neg.  CTA chest neg for PE Pt given KCL.    Pt remains very weak.  Current Medications (verified): 1)  Atenolol-Chlorthalidone 50-25 Mg Tabs (Atenolol-Chlorthalidone) .... Take 1 Tablet By Mouth Once A Day 2)  Effexor Xr 75 Mg Cp24 (Venlafaxine Hcl) .... Take 1 Tablet By Mouth Once A Day 3)  Lorazepam 1 Mg Tabs (Lorazepam) .... One Every 6 Hrs As Needed 4)  Remicade 100 Mg  Solr (Infliximab) .... Injection Every 6 Weeks 5)  Omnaris 50 Mcg/act Susp (Ciclesonide) .... One Spray Each Nostril Once Daily 6)  Estrace 1 Mg Tabs (Estradiol) .... Take 1 Tablet By Mouth Once A Day 7)  Restasis 0.05 %  Emul (Cyclosporine) .[name].. 1 Drop Each Eye Daily 8)  Lotemax 0.5 % Susp (Loteprednol Etabonate) .[name].. 1 Drop Each Eye Every 6 Hours As Needed 9)  Simvastatin 40 Mg Tabs (Simvastatin) .... Take One Tablet At Bedtime Start One Week After Completing Diflucan 10)  Levbid 0.375 Mg Xr12h-Tab (Hyoscyamine Sulfate) .[name].. 1 By Mouth Two Times A Day As Needed 11)  Prednisone 5 Mg Tabs (Prednisone) .... Take 1 Tablet By Mouth Every Other Day 12)  Cpap 7 Cm 13)  Nystatin-Triamcinolone 100000-0.1 Unit/gm-% Crea (Nystatin-Triamcinolone) .... Apply Two Times A Day To Affected Area As Needed 14)  Furosemide 20 Mg Tabs (Furosemide) .... Once Daily 15)  Klor-Con M20 20 Meq Cr-Tabs (Potassium Chloride Crys Cr) .... Take 3 Tablet By Mouth Once A Day 16)  Budeprion Xl 300 Mg Xr24h-Tab (Bupropion Hcl) .... Take 1 Tablet By Mouth Once A Day 17)  Tussicaps 10-8 Mg Xr12h-Cap (Hydrocod Polst-Chlorphen Polst) .... One By Mouth Two Times A Day As Needed Cough 18)  Benzonatate 100 Mg Caps (Benzonatate) .... Take One Every 4hours As Needed For Cough 19)  Dexilant 60 Mg Cpdr (Dexlansoprazole) .... One By Mouth Daily Failed Omeprazole Severe Reflux Disease  Allergies (verified): 1)  ! Methotrexate  Past History:  Past medical, surgical, family and social histories (including risk factors) reviewed, and no  changes noted (except as noted below).  Past Medical History:  SARCOIDOSIS (ICD-135)   -CT Chest 6/08 improvement in Mediastinal LAN   -11/08 start of remecade Rx per [facility] PULMONARY NODULE (ICD-518.89)    -CT Chest 6/08 reduction in size of nodules due to Arava and Pred RX     -probable granulomas due to Sarcoidosis HYPERTENSION (ICD-401.9) HYPERLIPIDEMIA (ICD-272.4) GERD (ICD-530.81) Sleep APnea DIABETES MELLITUS, TYPE II (ICD-250.00) ANEMIA-NOS (ICD-285.9)  Fundic Gland Polyp Atypical Chest pain    -10/11 cardiac cath neg for CAD  Past Surgical History: Reviewed history from 05/21/2010 and  no changes required. Ventral Hernia Repair Nissen Fundoplication Cholecystectomy Hysterectomy right knee arthroscopy May 11, 2010  Family History: Reviewed history from 10/20/2009 and no changes required. father-alive MI, DM mother alive and well uncle with SARCOID No FH of Colon Cancer: Family History Emphysema-Mother, Father  Family History Asthma-Asthma Family History COPD -mother  Social History: Reviewed history from 10/20/2009 and no changes required. Married, live with husband Never Smoked Regular exercise-no Alcohol use-no Illicit Drug Use - no  Review of Systems       The patient complains of shortness of breath with activity, chest pain, acid heartburn, indigestion, and difficulty swallowing.  The patient denies shortness of breath at rest, productive cough, non-productive cough, coughing up blood, irregular heartbeats, loss of appetite, weight change, abdominal pain, sore throat, tooth/dental problems, headaches, nasal congestion/difficulty breathing through nose, sneezing, itching, ear ache, anxiety, depression, hand/feet swelling, joint stiffness or pain, rash, change in color of mucus, and fever.    Vital Signs:  Patient profile:   50 year old female Menstrual status:  hysterectomy Height:      66 inches Weight:      243.13 pounds BMI:     39.38 O2 Sat:      99 % on Room air Temp:     97.8 degrees F oral Pulse rate:   86 / minute BP sitting:   112 / 70  (left arm) Cuff size:   large  Vitals Entered By: Gweneth Dimitri RN (August 04, 2010 2:37 PM)  O2 Flow:  Room air CC: 1 month follow up.  Pt states breathing is doing well overall.  Nonprod cough at times.  Denies SOB and wheezing.   Comments Medications reviewed with patient Daytime contact number verified with patient. Gweneth Dimitri RN  August 04, 2010 2:39 PM    Physical Exam  Additional Exam:   GEN: A/Ox3; pleasant , NAD, obese female.  HEENT:  [place]/AT, , EACs-clear, TMs-wnl, NOSE-mild erythema,  purulence seen R>L THROAT-clear NECK:  Supple w/ fair ROM; no JVD; normal carotid impulses w/o bruits; no thyromegaly or nodules palpated; no lymphadenopathy. RESP  Coarse BS w/ no wheeizng, has some mild upper airway psuedowheezing on forced exp.  CARD:  RRR, no m/r/g   Musco: Warm bil,  no calf tenderness edema, clubbing, pulses intact    Impression & Recommendations:  Problem # 1:  SARCOIDOSIS (ICD-135) Assessment Unchanged Atypical chest pain.  Weakness noted. Repeat K level normal and cortisol AM normal today. thus glucocorticoid def is r/o. I suspect this is due to esophageal spasm and GERD. This pt has never been compliant wiht diet. I doubt current syndrome is due to sarcoidosis. plan keep pred at current low dosage consider GI reevaluation>>switch to dexilant for now and d/c omeprazole  Problem # 2:  GERD (ICD-530.81) Assessment: Deteriorated As above.  Her updated medication list for this problem includes:    Levbid  0.375 Mg Xr12h-tab (Hyoscyamine sulfate) .[name]... 1 by mouth two times a day as needed    Dexilant 60 Mg Cpdr (Dexlansoprazole) ..... One by mouth daily failed omeprazole severe reflux disease  Orders: Est. Patient Level IV (16109)  Medications Added to Medication List This Visit: 1)  Nystatin-triamcinolone 100000-0.1 Unit/gm-% Crea (Nystatin-triamcinolone) .... Apply two times a day to affected area as needed  Complete Medication List: 1)  Atenolol-chlorthalidone 50-25 Mg Tabs (Atenolol-chlorthalidone) .... Take 1 tablet by mouth once a day 2)  Effexor Xr 75 Mg Cp24 (Venlafaxine hcl) .... Take 1 tablet by mouth once a day 3)  Lorazepam 1 Mg Tabs (Lorazepam) .... One every 6 hrs as needed 4)  Remicade 100 Mg Solr (Infliximab) .... Injection every 6 weeks 5)  Omnaris 50 Mcg/act Susp (Ciclesonide) .... One spray each nostril once daily 6)  Estrace 1 Mg Tabs (Estradiol) .... Take 1 tablet by mouth once a day 7)  Restasis 0.05 % Emul (Cyclosporine) .[name].. 1 drop  each eye daily 8)  Lotemax 0.5 % Susp (Loteprednol etabonate) .[name].. 1 drop each eye every 6 hours as needed 9)  Simvastatin 40 Mg Tabs (Simvastatin) .... Take one tablet at bedtime start one week after completing diflucan 10)  Levbid 0.375 Mg Xr12h-tab (Hyoscyamine sulfate) .[name].. 1 by mouth two times a day as needed 11)  Prednisone 5 Mg Tabs (Prednisone) .... Take 1 tablet by mouth every other day 12)  Cpap 7 Cm  13)  Nystatin-triamcinolone 100000-0.1 Unit/gm-% Crea (Nystatin-triamcinolone) .... Apply two times a day to affected area as needed 14)  Furosemide 20 Mg Tabs (Furosemide) .... Once daily 15)  Klor-con M20 20 Meq Cr-tabs (Potassium chloride crys cr) .... Take 3 tablet by mouth once a day 16)  Budeprion Xl 300 Mg Xr24h-tab (Bupropion hcl) .... Take 1 tablet by mouth once a day 17)  Tussicaps 10-8 Mg Xr12h-cap (Hydrocod polst-chlorphen polst) .... One by mouth two times a day as needed cough 18)  Benzonatate 100 Mg Caps (Benzonatate) .... Take one every 4hours as needed for cough 19)  Dexilant 60 Mg Cpdr (Dexlansoprazole) .... One by mouth daily failed omeprazole severe reflux disease  Other Orders: TLB-BMP (Basic Metabolic Panel-BMET) (80048-METABOL) TLB-Cortisol (82533-CORT)  Patient Instructions: 1)  No change in medications 2)  Labs today to check potassium and cortisol level 3)  I will call with results

## 2010-11-12 NOTE — Progress Notes (Signed)
Summary: critical lab [facility] pt  Phone Note From Other Clinic   Caller: gwenn received call Call For: jenkins for [facility] Summary of Call: Critical lab:  WBC 18.9   Initial call taken by: Rudy Jew, RN,  June 03, 2010 4:14 PM

## 2010-11-12 NOTE — Progress Notes (Signed)
Summary: returning call  Phone Note Call from Patient Call back at Home Phone [phone]   Caller: Patient Summary of Call: returning Norma's call about medication.  Please call her tomorrow. Initial call taken by: Gladis Riffle, RN,  January 01, 2009 1:58 PM  Follow-up for Phone Call        pt is aware zegerid has been approved until 12-25-2009 and pharmacy is aware patty [facility] tech at [facility] [address] Follow-up by: Heron Sabins,  January 02, 2009 8:45 AM

## 2010-11-12 NOTE — Assessment & Plan Note (Signed)
Summary: severe esophagitis..? egd...em    History of Present Illness Visit Type: Follow-up Visit Primary GI MD: Yancey Flemings MD Primary Provider: Leeanne Deed Requesting Provider: Adella Hare Chief Complaint: Severe Esophagitis History of Present Illness:   complicated 50 year old white female with multiple medical problems including morbid obesity, hypertension, hyperlipidemia, diabetes mellitus, pulmonary sarcoid, GERD status post fundoplication, chronic atypical chest pain, and recurrent problems with bronchospasm and respiratory failure. She is accompanied by her mother.She was hospitalized last month for the latter. Chills me that there was some concern that her symptoms may be related to GERD. She was noted to have oral candidiasis. She has had some breakthrough heartburn. She is now on b.i.d. Zegerid as well as at night Zantac. This has helped the symptoms. She has vague dysphagia which is quite mild. Ongoing chronic chest pain. She has had prior upper endoscopy and colonoscopy. Her last upper endoscopy was in 2004. This showed evidence of prior antireflux surgery. She has also had pH studies performed post fundoplication with minimal reflux symptoms noted.   GI Review of Systems    Reports acid reflux, bloating, chest pain, dysphagia with liquids, dysphagia with solids, and  nausea.      Denies abdominal pain, belching, heartburn, loss of appetite, vomiting, vomiting blood, weight loss, and  weight gain.        Denies anal fissure, black tarry stools, change in bowel habit, constipation, diarrhea, diverticulosis, fecal incontinence, heme positive stool, hemorrhoids, irritable bowel syndrome, jaundice, light color stool, liver problems, rectal bleeding, and  rectal pain.    Current Medications (verified): 1)  Proair Hfa 108 (90 Base) Mcg/act  Aers (Albuterol Sulfate) .... 2 Puffs Three Times A Day As Needed 2)  Atenolol-Chlorthalidone 50-25 Mg Tabs (Atenolol-Chlorthalidone)  .... Take 1 Tablet By Mouth Once A Day 3)  Budeprion Xl 300 Mg Tb24 (Bupropion Hcl) .... Take 1 Tablet By Mouth Once A Day 4)  Effexor Xr 75 Mg Cp24 (Venlafaxine Hcl) .... Take 1 Capsule By Mouth Two Times A Day 5)  Lorazepam 1 Mg Tabs (Lorazepam) .... Prn 6)  Novolog Flexpen 100 Unit/ml Soln (Insulin Aspart) .... Three Times A Day As Directed 7)  Onetouch Ultra Test  Strp (Glucose Blood) .... Tid 8)  Potassium Chloride Crys Cr 20 Meq Tbcr (Potassium Chloride Crys Cr) .[name].. 1 Three Times A Day 9)  Prednisone 10 Mg Tabs (Prednisone) .[name].. 1 By Mouth Two Times A Day 10)  Zegerid 40-1100 Mg Caps (Omeprazole-Sodium Bicarbonate) .... One Twice Daily 11)  Crestor 20 Mg Tabs (Rosuvastatin Calcium) .[name].. 1 By Mouth Once Daily 12)  Pen Needles 31g X 8 Mm  Misc (Insulin Pen Needle) .... Use As Directed 13)  Remicade 100 Mg  Solr (Infliximab) .... Injection Every 6 Weeks 14)  Fluticasone Propionate 50 Mcg/act  Susp (Fluticasone Propionate) .... 2 Sprays Each Nostril Once Daily 15)  Furosemide 20 Mg Tabs (Furosemide) .... Take One (1) By Mouth Daily 16)  Vicodin 5-500 Mg  Tabs (Hydrocodone-Acetaminophen) .... One Tab Every 6 Hours As Needed 17)  Vitamin D 1000 Unit  Caps (Cholecalciferol) .... Two Times A Day 18)  Estrace 1 Mg Tabs (Estradiol) .... Take 1 Tablet By Mouth Once A Day 19)  Slow Release Iron 47.5 Mg Cr-Tabs (Ferrous Sulfate) .... Once Daily 20)  Multiple Vitamin  Tabs (Multiple Vitamin) .... Once Daily 21)  Tussionex Pennkinetic Er 8-10 Mg/55ml Lqcr (Chlorpheniramine-Hydrocodone) .... Take 1 Teaspoon Every 12 Hrs As Needed Cough 22)  Restasis 0.05 % Emul (Cyclosporine) .Marland KitchenMarland KitchenMarland Kitchen  1 Drop Each Eye Daily 23)  Lotemax 0.5 % Susp (Loteprednol Etabonate) .[name].. 1 Drop Each Eye Every 6 Hours As Needed 24)  Utac 500-500 Mg Tabs (Methenamine Mand-Sod Phosphate) .... Two Times A Day 25)  Zantac 150 Mg Tabs (Ranitidine Hcl) .... One Daily 26)  Tessalon Perles 100 Mg  Caps (Benzonatate) .... One To Two By Mouth 3-4  Times Daily 27)  Diflucan 100 Mg Tabs (Fluconazole) .[name].. 1 By Mouth For 7 Days 28)  Zantac 150 Mg Tabs (Ranitidine Hcl) .[name].. 1 By Mouth Once Daily  Allergies (verified): 1)  ! Methotrexate  Past History:  Past Medical History: Last updated: 04/21/2009 EDEMA (ICD-782.3) HEADACHE (ICD-784.0) MENOPAUSAL SYNDROME (ICD-627.2) UTI (ICD-599.0) SARCOIDOSIS (ICD-135)   -CT Chest 6/08 improvement in Mediastinal LAN   -11/08 start of remecade Rx per [facility] PULMONARY NODULE (ICD-518.89)    -CT Chest 6/08 reduction in size of nodules due to Arava and Pred RX     -probable granulomas due to Sarcoidosis HYPERTENSION (ICD-401.9) HYPERLIPIDEMIA (ICD-272.4) GERD (ICD-530.81) Sleep APnea DIABETES MELLITUS, TYPE II (ICD-250.00) ANEMIA-NOS (ICD-285.9)    Past Surgical History: Last updated: 08/21/2006 Ventral Hernia Repair Nissen Fundoplication Cholecystectomy Hysterectomy  Family History: father-alive MI, DM mother alive and well uncle with SARCOID No FH of Colon Cancer:  Social History: Married Never Smoked Regular exercise-no Alcohol use-no Illicit Drug Use - no Drug Use:  no  Review of Systems       joint aches, shortness of breath, back pain. All other systems reviewed and reported as negative.  Vital Signs:  Patient profile:   50 year old female Height:      66 inches Weight:      268.38 pounds BMI:     43.47 BSA:     2.27 Pulse rate:   84 / minute Pulse rhythm:   regular BP sitting:   128 / 78  (left arm)  Vitals Entered By: Merri Ray CMA Duncan Dull) (May 12, 2009 3:49 PM)  Physical Exam  General:  obese female in no acute distress she is alert and oriented Head:  Normocephalic and atraumatic. Eyes:  PERRLA, no icterus. Nose:  No deformity, discharge,  or lesions. Mouth:  No deformity or lesions, dentition normal. Neck:  Supple; no masses or thyromegaly. Lungs:  Clear throughout to auscultation. Heart:  Regular rate and rhythm; no  murmurs, rubs,  or bruits. Abdomen:  obese,Soft, nontender and nondistended. No masses, hepatosplenomegaly or hernias noted. Normal bowel sounds. Msk:  Symmetrical with no gross deformities. Normal posture. Pulses:  Normal pulses noted. Extremities:  No clubbing, cyanosis, edema or deformities noted. Neurologic:  Alert and  oriented x4;  grossly normal neurologically. Skin:  Intact without significant lesions or rashes. Psych:  Alert and cooperative. Normal mood and affect.   Impression & Recommendations:  Problem # 1:  GERD (ICD-530.81) Very complicated patient with multiple medical problems. Recent hospitalization with bronchospasm. Multiple previous admissions for similar problems. History of GERD as well as prior fundoplication. No convincing evidence that prior aggressive treatment for GERD including surgery his help her pulmonary issues. Question now been raised as to whether or not her plate a role in her recent respiratory decompensation. Extremely difficult to know and more difficult to prove. It has been a while since her fundoplication she does report some classic reflux symptoms which have responded to PPI therapy. Also recent candidiasis. At this point I would advocate continuing acid suppressive therapy without change as this controls classic symptoms. We could  perform upper endoscopy to assess for "slipped" fundoplication wrap as well as any evidence of persistent candidiasis (given complains of mild dysphagia and chest pain-know she has had these in the past). She is interested in upper endoscopy. The major the procedure as well as the risks, benefits, and alternatives have been reviewed. She understood and agreed to proceed. The patient is HIGH RISK given her pulmonary problems. As well, we will need to manage her diabetes/insulin for the procedure. She has been instructed to hold her insulin the day of the exam while n.p.o.  Other Orders: EGD (EGD)  Patient Instructions: 1)  EGD  [facility] 06/03/09 3:00 pm  2)  Upper Endoscopy brochure given.  3)  Hold diabetic meds day of procedure 4)  The medication list was reviewed and reconciled.  All changed / newly prescribed medications were explained.  A complete medication list was provided to the patient / caregiver.

## 2010-11-12 NOTE — Progress Notes (Signed)
Summary: pt called will questions re: Dulera she was given today  Phone Note Call from Patient   Caller: Patient Summary of Call: Pt called and said that she thought she had been given a nasal spray at her appt today, but when pt opened the sample it was some kind of Inhaler call Dulera. Please call pt and clarify what she should do.  Initial call taken by: Lucy Antigua,  August 04, 2009 9:58 AM  Follow-up for Phone Call        my mistake--- fluticasone 2 sprays each nostril daily  #1/3 refills Follow-up by: Birdie Sons MD,  August 04, 2009 4:13 PM  Additional Follow-up for Phone Call Additional follow up Details #1::        Phone Call Completed Additional Follow-up by: Kern Reap CMA Duncan Dull),  August 05, 2009 3:36 PM    New/Updated Medications: FLUTICASONE PROPIONATE 50 MCG/ACT SUSP (FLUTICASONE PROPIONATE) 2 sprays per nostril once daily Prescriptions: FLUTICASONE PROPIONATE 50 MCG/ACT SUSP (FLUTICASONE PROPIONATE) 2 sprays per nostril once daily  #1 x 3   Entered by:   Kern Reap CMA (AAMA)   Authorized by:   Birdie Sons MD   Signed by:   Kern Reap CMA (AAMA) on 08/05/2009   Method used:   Electronically to        [facility] (retail)       [address]       [place], [place]  [address]       Ph: [phone]       Fax: [phone]   RxID:   [id number]

## 2010-11-12 NOTE — Letter (Signed)
Summary: [facility]   Imported By: Maryln Gottron 01/05/2010 15:46:55  _____________________________________________________________________  External Attachment:    Type:   Image     Comment:   External Document

## 2010-11-12 NOTE — Assessment & Plan Note (Signed)
Summary: 6 WEEKS/APC  Medications Added REMICADE 100 MG  SOLR (INFLIXIMAB) Injection every 2 weeks        Chief Complaint:  arms, legs, and chest pains, headache, earache, and no sputum....fatigue.....  History of Present Illness: This is a 50 year old, white female with chronic pulmonary sarcoidosis.  This is been manifested by cyclic cough and mediastinal adenopathy with associated chest pain.  There are also stable pulmonary nodules.  The patient's-day-old systemic corticosteroids.  She also had adverse side effects to a raise the.  On this basis we referred the patient in November to Dr. Chase Picket, for Remicade injections.  She states so far, there has not been significant benefit from the Remicade injections.  She is a following with another injection in the next week.  She has had no adverse side effects from the Remicade injections.  She notes chronic leg shoulder and arm pain.  There is still a slight cough.  She is still short of breath.  She recently having acute sinusitis and is treated with Ceftin.  The patient is here today for return follow-up.     Current Allergies: ! METHOTREXATE  Past Medical History:    Reviewed history from 09/15/2007 and no changes required:       Anemia-NOS       Diabetes mellitus, type II       GERD       Hyperlipidemia       Hypertension       pulmonary nodule       OSA       Vocal Cord dysfunction with recurrent cough       Asthma vs Vocal cord dysfundtion       SARCOID   Family History:    Reviewed history from 09/15/2007 and no changes required:       father-alive MI, DM       mother alive and well       uncle with SARCOID  Social History:    Reviewed history from 05/31/2007 and no changes required:       Married       Never Smoked       Regular exercise-no       Alcohol use-no    Review of Systems      See HPI  General      Denies fever and chills.  ENT      Denies post nasal drip.  CV      Denies swelling of  hands or feet.   Vital Signs:  Patient Profile:   50 Years Old Female Height:     66 inches Weight:      278.8 pounds BMI:     45.16 O2 Sat:      97 % O2 treatment:    Room Air Temp:     98.3 degrees F oral Pulse rate:   82 / minute BP sitting:   132 / 80  (left arm) Cuff size:   regular  Pt. in pain?   no  Vitals Entered By: Clarise Cruz Duncan Dull) (October 25, 2007 4:11 PM)                  Physical Exam  Gen obese WF         in NAD    NCAT Heent:  no jvd, no TMG, no cervical LNademopathy, orophyx clear,  nares with clear watery drainage. Cor: RRR nl s1/s2  no s3/s4  no m r h g Abd: soft  NT BSA   no masses  No HSM  no rebound or guarding Ext perfused with no c v e v.d Neuro: intact, moves all 4s, CN II-XII intact, DTRs intact Chest: distant BS  no wheezes, rales, rhonchi   no egophony  no consolidative breath sounds, mild hyperresonance to percussion Skin: clear  Genital/Rectal :deferred      Impression & Recommendations:  Problem # 1:  SARCOIDOSIS (ICD-135) Pulmonary sarcoidosis with associated chronic chest wall pain and associated mediastinal adenopathy. Plan continue Remicade injections every two weeks. Continue prednisone therapy at a dosage of 15 mg daily The following medications were removed from the medication list:    Diclofenac Sodium 75 Mg Tbec (Diclofenac sodium) .[name]... 1 by mouth two times a day with food  Her updated medication list for this problem includes:    Albuterol 90 Mcg/act Aers (Albuterol) ..... Inhale 2 puff as directed three times a day    Prednisone 10 Mg Tabs (Prednisone) .[name]... 1 1/2 once daily    Qvar 80 Mcg/act Aers (Beclomethasone dipropionate) .[name]..[name] Two puffs twice daily    Ceftin 250 Mg Tabs (Cefuroxime axetil) ..... One by mouth bid  Orders: Est. Patient Level III ([address])   Problem # 2:  GERD (ICD-530.81) Assessment: Unchanged Gastroesophageal reflux disease, exacerbated by chronic steroid use. Plan continue Zegerid  on a twice-daily basis along with Reglan Her updated medication list for this problem includes:    Zegerid 40-1100 Mg Caps (Omeprazole-sodium bicarbonate) .[name]... 2 by mouth daily   Medications Added to Medication List This Visit: 1)  Remicade 100 Mg Solr (Infliximab) .... Injection every 2 weeks  Complete Medication List: 1)  Albuterol 90 Mcg/act Aers (Albuterol) .... Inhale 2 puff as directed three times a day 2)  Atenolol-chlorthalidone 50-25 Mg Tabs (Atenolol-chlorthalidone) .... 1/1/2 daily 3)  Budeprion Xl 300 Mg Tb24 (Bupropion hcl) .... Take 1 tablet by mouth once a day 4)  Effexor Xr 75 Mg Cp24 (Venlafaxine hcl) .... Take 1 capsule by mouth once a day 5)  Lorazepam 1 Mg Tabs (Lorazepam) .... Prn 6)  Novolog Flexpen 100 Unit/ml Soln (Insulin aspart) 7)  Onetouch Ultra Test Strp (Glucose blood) 8)  Potassium Chloride Crys Cr 20 Meq Tbcr (Potassium chloride crys cr) .... Take 1 tablet by mouth once a day 9)  Prednisone 10 Mg Tabs (Prednisone) .[name].. 1 1/2 once daily 10)  Reglan 10 Mg Tabs (Metoclopramide hcl) .... Take 1 tablet by mouth twice a day 11)  Zegerid 40-1100 Mg Caps (Omeprazole-sodium bicarbonate) .... 2 by mouth daily 12)  Crestor 20 Mg Tabs (Rosuvastatin calcium) .[name].. 1 by mouth once daily 13)  Qvar 80 Mcg/act Aers (Beclomethasone dipropionate) .... Two puffs twice daily 14)  Pen Needles 31g X 8 Mm Misc (Insulin pen needle) .... Use as directed need f/u appt b-4 addtional refills 15)  Premarin 0.3 Mg Tabs (Estrogens conjugated) .... Once daily 16)  Ceftin 250 Mg Tabs (Cefuroxime axetil) .... One by mouth bid 17)  Remicade 100 Mg Solr (Infliximab) .... Injection every 2 weeks   Patient Instructions: 1)  Please schedule a follow-up appointment in 2 months. 2)  No change in medications 3)  Stay on Remecade for now    ]

## 2010-11-12 NOTE — Assessment & Plan Note (Signed)
Summary: 4 MONTH ROV/NJR/HEADACHES/DIZZYNESS/RSC/CJR   Vital Signs:  Patient profile:   50 year old female Weight:      273 pounds Temp:     98.2 degrees F Pulse rate:   72 / minute Resp:     16 per minute BP sitting:   122 / 78  (left arm) Cuff size:   large  Vitals Entered By: Gladis Riffle, RN (April 21, 2009 8:14 AM)  Primary Care Provider:  Dr. Birdie Sons   History of Present Illness:  Follow-Up Visit      [name] who presents for Follow-up visit.  The patient denies chest pain, palpitations, dizziness, syncope, low blood sugar symptoms, high blood sugar symptoms, edema, SOB, DOE, PND, and orthopnea.  The patient reports taking meds as prescribed, not monitoring BP, and dietary noncompliance.  When questioned about possible medication side effects, the patient notes none.    She feels fatigued---note hx of OSA--on CPAP. tolerating the settings well  Headache---seems to coincide with fatigue Sarcoid---ollowed by Dr. Delford Field and receives remicade (dr Dareen Piano) pt accompanied by husband who provides part of the hx  Allergies: 1)  ! Methotrexate  Comments:  Nurse/Medical Assistant: 4 month rov--c/o pain down left arm and chest pain x 1 week, also aches in arms and legs--CBGs 160-188 at home--states feels tired The patient's medications and allergies were reviewed with the patient and were updated in the Medication and Allergy Lists. Gladis Riffle, RN (April 21, 2009 8:15 AM)  Past History:  Past Surgical History: Last updated: 08/21/2006 Ventral Hernia Repair Nissen Fundoplication Cholecystectomy Hysterectomy  Family History: Last updated: 09/15/2007 father-alive MI, DM mother alive and well uncle with SARCOID  Social History: Last updated: 05/31/2007 Married Never Smoked Regular exercise-no Alcohol use-no  Risk Factors: Exercise: no (05/31/2007)  Risk Factors: Smoking Status: never (04/08/2009)  Past Medical History: EDEMA  (ICD-782.3) HEADACHE (ICD-784.0) MENOPAUSAL SYNDROME (ICD-627.2) UTI (ICD-599.0) SARCOIDOSIS (ICD-135)   -CT Chest 6/08 improvement in Mediastinal LAN   -11/08 start of remecade Rx per [facility] PULMONARY NODULE (ICD-518.89)    -CT Chest 6/08 reduction in size of nodules due to Arava and Pred RX     -probable granulomas due to Sarcoidosis HYPERTENSION (ICD-401.9) HYPERLIPIDEMIA (ICD-272.4) GERD (ICD-530.81) Sleep APnea DIABETES MELLITUS, TYPE II (ICD-250.00) ANEMIA-NOS (ICD-285.9)    Review of Systems       All other systems reviewed and were negative   Physical Exam  General:  nad, obese Head:  normocephalic and atraumatic Eyes:  pupils equal and pupils round.   Ears:  R ear normal and L ear normal.   Neck:  no masses, thyromegaly, or abnormal cervical nodes Lungs:  Normal respiratory effort, chest expands symmetrically. Lungs are clear to auscultation, no crackles or wheezes. Heart:  Normal rate and regular rhythm. S1 and S2 normal without gallop, murmur, click, rub or other extra sounds. Abdomen:  soft and non-tender.  obese Msk:  No deformity or scoliosis noted of thoracic or lumbar spine.   no joint swelling or erythema Pulses:  R radial normal and L radial normal.   Extremities:  trace left pedal edema and trace right pedal edema.   Neurologic:  cranial nerves II-XII intact and gait normal.     Impression & Recommendations:  Problem # 1:  SLEEP APNEA (ICD-780.57)  this could explain many of her sxs: headache, fatigue, muscle aches etc  Orders: Sleep Disorder Referral (Sleep Disorder)  Problem # 2:  MYALGIA (ICD-729.1)  some ofher sxs are short lived, isolated to left arem and resolving other of her myalgia-like complaints are generalized note she is on statin will continue statin for now but may need to d/c check CK Her updated medication list for this problem includes:    Vicodin 5-500 Mg Tabs (Hydrocodone-acetaminophen) ..... One tab every  6 hours as needed    Diclofenac Sodium 50 Mg Tbec (Diclofenac sodium) .[name]... 1 by mouth 2 times daily. take with food  Orders: TLB-CK Total Only(Creatine Kinase/CPK) (82550-CK)  Problem # 3:  DIABETES MELLITUS, TYPE II (ICD-250.00) check labs today   Her updated medication list for this problem includes:    Novolog Flexpen 100 Unit/ml Soln (Insulin aspart) .[name]... Three times a day as directed    Benicar 40 Mg Tabs (Olmesartan medoxomil) .[name]... 1 by mouth once daily  Labs Reviewed: Creat: 0.6 (12/24/2008)     Last Eye Exam: normal-pt's report (07/11/2008) Reviewed HgBA1c results: 5.9 (12/24/2008)  6.7 (09/30/2008)  Problem # 4:  HYPERLIPIDEMIA (ICD-272.4) check labs today Her updated medication list for this problem includes:    Crestor 20 Mg Tabs (Rosuvastatin calcium) .[name]... 1 by mouth once daily  Labs Reviewed: SGOT: 19 (12/24/2008)   SGPT: 14 (12/24/2008)  Prior 10 Yr Risk Heart Disease: 8 % (09/21/2006)   HDL:36.10 (12/24/2008), 43.5 (09/30/2008)  LDL:DEL (09/30/2008), DEL (06/05/2008)  Chol:150 (12/24/2008), 255 (09/30/2008)  Trig:352.0 (12/24/2008), 376 (09/30/2008)  Problem # 5:  HEADACHE (ICD-784.0)  ? related to meds or possibly sleepapnea check ESR reviewed previous MRI brain (2008) Her updated medication list for this problem includes:    Atenolol-chlorthalidone 50-25 Mg Tabs (Atenolol-chlorthalidone) .[name]... Take 1 tablet by mouth once a day    Vicodin 5-500 Mg Tabs (Hydrocodone-acetaminophen) ..... One tab every 6 hours as needed    Diclofenac Sodium 50 Mg Tbec (Diclofenac sodium) .[name]... 1 by mouth 2 times daily. take with food  Orders: Sedimentation Rate, non-automated ([address])  Complete Medication List: 1)  Proair Hfa 108 (90 Base) Mcg/act Aers (Albuterol sulfate) .... 2 puffs three times a day as needed 2)  Atenolol-chlorthalidone 50-25 Mg Tabs (Atenolol-chlorthalidone) .... Take 1 tablet by mouth once a day 3)  Budeprion Xl 300 Mg Tb24 (Bupropion hcl) ....  Take 1 tablet by mouth once a day 4)  Effexor Xr 75 Mg Cp24 (Venlafaxine hcl) .... Take 1 capsule by mouth two times a day 5)  Lorazepam 1 Mg Tabs (Lorazepam) .... Prn 6)  Novolog Flexpen 100 Unit/ml Soln (Insulin aspart) .... Three times a day as directed 7)  Onetouch Ultra Test Strp (Glucose blood) .... Tid 8)  Potassium Chloride Crys Cr 20 Meq Tbcr (Potassium chloride crys cr) .... Take 1 tablet by mouth two times a day 9)  Prednisone 5 Mg Tabs (Prednisone) .... Take 1 tablet by mouth once a day 10)  Zegerid 40-1100 Mg Caps (Omeprazole-sodium bicarbonate) .... One twice daily 11)  Crestor 20 Mg Tabs (Rosuvastatin calcium) .[name].. 1 by mouth once daily 12)  Qvar 80 Mcg/act Aers (Beclomethasone dipropionate) .... Two puffs twice daily 13)  Pen Needles 31g X 8 Mm Misc (Insulin pen needle) .... Use as directed 14)  Remicade 100 Mg Solr (Infliximab) .... Injection every 6 weeks 15)  Fluticasone Propionate 50 Mcg/act Susp (Fluticasone propionate) .... 2 sprays each nostril once daily 16)  Furosemide 20 Mg Tabs (Furosemide) .... Take one (1) by mouth daily 17)  Vicodin 5-500 Mg Tabs (Hydrocodone-acetaminophen) .... One tab every 6 hours as needed 18)  Vitamin D 1000  Unit Caps (Cholecalciferol) .... Two times a day 19)  Benicar 40 Mg Tabs (Olmesartan medoxomil) .[name].. 1 by mouth once daily 20)  Estrace 1 Mg Tabs (Estradiol) .... Take 1 tablet by mouth once a day 21)  Slow Release Iron 47.5 Mg Cr-tabs (Ferrous sulfate) .... Once daily 22)  Multiple Vitamin Tabs (Multiple vitamin) .... Once daily 23)  Tussionex Pennkinetic Er 8-10 Mg/72ml Lqcr (Chlorpheniramine-hydrocodone) .... Take 1 teaspoon every 12 hrs as needed cough 24)  Restasis 0.05 % Emul (Cyclosporine) .[name].. 1 drop each eye daily 25)  Fish Oil 500 Mg Caps (Omega-3 fatty acids) .... Once daily 26)  Lotemax 0.5 % Susp (Loteprednol etabonate) .[name].. 1 drop each eye every 6 hours as needed 27)  Utac 500-500 Mg Tabs (Methenamine mand-sod phosphate)  .... Two times a day 28)  Diclofenac Sodium 50 Mg Tbec (Diclofenac sodium) .[name].. 1 by mouth 2 times daily. take with food  Other Orders: TLB-A1C / Hgb A1C (Glycohemoglobin) (83036-A1C) TLB-BMP (Basic Metabolic Panel-BMET) (80048-METABOL) TLB-Lipid Panel (80061-LIPID) TLB-ALT (SGPT) (84460-ALT)  Appended Document: 4 MONTH ROV/NJR/HEADACHES/DIZZYNESS/RSC/CJR  Laboratory Results   Blood Tests     SED rate: 36 mm/hr  Comments: Joanne Chars CMA  April 21, 2009 9:32 AM

## 2010-11-12 NOTE — Letter (Signed)
Summary: [facility]  [facility]   Imported By: Maryln Gottron 09/06/2008 15:32:28  _____________________________________________________________________  External Attachment:    Type:   Image     Comment:   External Document

## 2010-11-12 NOTE — Medication Information (Signed)
Summary: Order for Diabetic Testing Supplies  Order for Diabetic Testing Supplies   Imported By: Maryln Gottron 01/12/2010 12:24:11  _____________________________________________________________________  External Attachment:    Type:   Image     Comment:   External Document

## 2010-11-12 NOTE — Assessment & Plan Note (Signed)
Summary: 2 month fup//ccm--Rm 14   Vital Signs:  Patient profile:   50 year old female Menstrual status:  hysterectomy Height:      66 inches Weight:      236 pounds BMI:     38.23 Temp:     98.8 degrees F oral Pulse rate:   78 / minute Pulse rhythm:   regular Resp:     16 per minute BP sitting:   150 / 88  (right arm) Cuff size:   large  Vitals Entered By: Mervin Kung CMA Duncan Dull) (July 23, 2010 8:35 AM) CC: Rm 14  2 month f/u. Pt recently in [place]. Had cardiac cath but told it was negative and symptoms likely due to reflux. Is Patient Diabetic? Yes Pain Assessment Patient in pain? yes     Location: chest discomfort Type: dull, aching Onset of pain  Started Tuesday. Comments Pt agrees all med doses and directions are correct. Mervin Kung CMA Duncan Dull)  July 23, 2010 8:41 AM    Primary Care Provider:  Birdie Sons, MD   CC:  Rm 14  2 month f/u. Pt recently in [place]. Had cardiac cath but told it was negative and symptoms likely due to reflux.[name]  History of Present Illness:  Follow-Up Visit      [name] who presents for Follow-up visit.  The patient denies chest pain and palpitations.  Since the last visit the patient notes a recent hospitilization and being seen by a specialist.  The patient reports taking meds as prescribed.  When questioned about possible medication side effects, the patient notes none.  Hospitalized for CP---Cath by dr. barry---normal  reviewed labs---note TG of 740!  All other systems reviewed and were negative   Allergies: 1)  ! Methotrexate  Past History:  Past Medical History: Last updated: 05/21/2010  SARCOIDOSIS (ICD-135)   -CT Chest 6/08 improvement in Mediastinal LAN   -11/08 start of remecade Rx per [facility] PULMONARY NODULE (ICD-518.89)    -CT Chest 6/08 reduction in size of nodules due to Arava and Pred RX     -probable granulomas due to Sarcoidosis HYPERTENSION  (ICD-401.9) HYPERLIPIDEMIA (ICD-272.4) GERD (ICD-530.81) Sleep APnea DIABETES MELLITUS, TYPE II (ICD-250.00) ANEMIA-NOS (ICD-285.9)  Fundic Gland Polyp  Past Surgical History: Last updated: 05/21/2010 Ventral Hernia Repair Nissen Fundoplication Cholecystectomy Hysterectomy right knee arthroscopy May 11, 2010  Family History: Last updated: 10/20/2009 father-alive MI, DM mother alive and well uncle with SARCOID No FH of Colon Cancer: Family History Emphysema-Mother, Father  Family History Asthma-Asthma Family History COPD -mother  Social History: Last updated: 10/20/2009 Married, live with husband Never Smoked Regular exercise-no Alcohol use-no Illicit Drug Use - no  Risk Factors: Exercise: no (05/31/2007)  Risk Factors: Smoking Status: never (06/17/2010)  Physical Exam  General:  overweight female in no acute distress. HEENT exam atraumatic, normocephalic. Neck is supple. Chest clear to auscultation without increased work of breathing. Cardiac exam S1-S2 are regular. Abdominal exam obese, bowel sounds, soft. Extremities there is no clubbing cyanosis. There is 1+ edema ankles.   Impression & Recommendations:  Problem # 1:  COUGH (ICD-786.2) better today chronic cough? reflux vs sarcoid no change in Rx  Problem # 2:  HYPERLIPIDEMIA (ICD-272.4)  note elevated TG recheck today Her updated medication list for this problem includes:    Simvastatin 40 Mg Tabs (Simvastatin) .[name]... Take one tablet at bedtime start one week after completing diflucan  Orders: Venipuncture ([address]) Specimen Handling (  [address]) TLB-Lipid Panel (80061-LIPID)  Problem # 3:  DIABETES MELLITUS, TYPE II (ICD-250.00) no meds A1C in hospital 7.1 Labs Reviewed: Creat: 0.6 (06/03/2010)     Last Eye Exam: normalpt's report (10/11/2009) Reviewed HgBA1c results: 6.8 (04/15/2010)  6.6 (12/02/2009)  Complete Medication List: 1)  Atenolol-chlorthalidone 50-25 Mg Tabs (Atenolol-chlorthalidone)  .... Take 1 tablet by mouth once a day 2)  Effexor Xr 75 Mg Cp24 (Venlafaxine hcl) .... Take 1 tablet by mouth once a day 3)  Lorazepam 1 Mg Tabs (Lorazepam) .... One every 6 hrs as needed 4)  Remicade 100 Mg Solr (Infliximab) .... Injection every 6 weeks 5)  Omnaris 50 Mcg/act Susp (Ciclesonide) .... One spray each nostril once daily 6)  Estrace 1 Mg Tabs (Estradiol) .... Take 1 tablet by mouth once a day 7)  Restasis 0.05 % Emul (Cyclosporine) .[name].. 1 drop each eye daily 8)  Lotemax 0.5 % Susp (Loteprednol etabonate) .[name].. 1 drop each eye every 6 hours as needed 9)  Simvastatin 40 Mg Tabs (Simvastatin) .... Take one tablet at bedtime start one week after completing diflucan 10)  Levbid 0.375 Mg Xr12h-tab (Hyoscyamine sulfate) .[name].. 1 by mouth two times a day as needed 11)  Prednisone 5 Mg Tabs (Prednisone) .... Take 1 tablet by mouth every other day 12)  Cpap 7 Cm  13)  Nystatin-triamcinolone 100000-0.1 Unit/gm-% Crea (Nystatin-triamcinolone) .... Apply two times a day to affected area 14)  Furosemide 20 Mg Tabs (Furosemide) .... Once daily 15)  Klor-con M20 20 Meq Cr-tabs (Potassium chloride crys cr) .... Take 3 tablet by mouth once a day 16)  Budeprion Xl 300 Mg Xr24h-tab (Bupropion hcl) .... Take 1 tablet by mouth once a day 17)  Tussicaps 10-8 Mg Xr12h-cap (Hydrocod polst-chlorphen polst) .... One by mouth two times a day as needed cough 18)  Benzonatate 100 Mg Caps (Benzonatate) .... Take one every 4hours as needed for cough 19)  Dexilant 60 Mg Cpdr (Dexlansoprazole) .... One by mouth daily failed omeprazole severe reflux disease  Other Orders: TLB-BMP (Basic Metabolic Panel-BMET) (80048-METABOL)  Patient Instructions: 1)  Please schedule a follow-up appointment in 1 month.   Immunization History:  Influenza Immunization History:    Influenza:  historical (07/16/2010)   Current Allergies (reviewed today): ! METHOTREXATE    Echocardiogram Report  Procedure date:   07/21/2010  Findings:       Study Conclusions    - Left ventricle: The cavity size was normal. Wall thickness was     increased in a pattern of mild LVH. Systolic function was normal.     The estimated ejection fraction was in the range of 60% to 65%.     Doppler parameters are consistent with abnormal left ventricular     relaxation (grade 1 diastolic dysfunction).   - Aortic valve: Trivial regurgitation.   - Right ventricle: The cavity size was mildly dilated.   Transthoracic echocardiography. M-mode, complete 2D, spectral   Doppler, and color Doppler. Height: Height: 170.2cm. Height: 67in.   Weight: Weight: 104.6kg. Weight: 230.1lb. Body mass index: BMI:   36.1kg/m^2. Body surface area: BSA: 2.5m^2. Blood pressure: 136/78.   Patient status: Inpatient. Location: Bedside.  Cardiac Catheterization Report  Procedure date:  07/21/2010  Findings:       SELECTIVE CORONARY ANGIOGRAPHY:   1. Left main normal.   2. LAD normal.   3. Left circumflex normal.   4. Right coronary is dominant and normal.   5. Ventriculography; RAO left ventriculogram was performed using 25 mL  of Visipaque dye at 12 mL per second.  The overall LVEF was       estimated at approximately 50-55% with very mild global       hypokinesia.      IMPRESSION:  Ms. Loveland has essentially normal coronaries and normal   left ventricular function.  I believe her chest pain is noncardiac.  The   sheath was removed and sealed with a NexFoam hemostasis device.  She   left lab in stable condition.  She will be gently hydrated overnight and   returned to [facility] where continued workup will be pursued   by the [facility].

## 2010-11-12 NOTE — Assessment & Plan Note (Signed)
Summary: HEADACHE // RS   Vital Signs:  Patient profile:   50 year old female Menstrual status:  hysterectomy Temp:     98.5 degrees F oral BP sitting:   130 / 76  (left arm) Cuff size:   large  Vitals Entered By: Sid Falcon LPN (September 23, 2010 1:48 PM)  History of Present Illness: patient seen with 4 day history of headache.  recent history is that she had 20 some teeth extracted on 8th of this month. Dentist did not feel headache was related. She describes a dull bifrontal headache off and on. 8 out 10 severity at times. Tylenol and Advil without relief. No aggravating factors. Denies any fever or, sore throat, chills, or any sinusitis symptoms. No focal neurologic symptoms.  Other medical problems include sarcoidosis, Type 2 diabetes, gout, and hypertension.  Chronic meds reviewed.  Denies any med side effects.  No symptoms of hyperglycemia.  Headache HPI:      Duration of headaches: 1 week.        On a scale of 1-10, she describes the headache as a 4.  The location of the headaches are bitemporal.  Headache quality is intermittent.        Allergies: 1)  ! Methotrexate  Past History:  Past Medical History: Last updated: 08/20/2010 SARCOIDOSIS (ICD-135)   -CT Chest 6/08 improvement in Mediastinal LAN   -11/08 start of remecade Rx per [facility] PULMONARY NODULE (ICD-518.89)    -CT Chest 6/08 reduction in size of nodules due to Arava and Pred RX     -probable granulomas due to Sarcoidosis HYPERTENSION (ICD-401.9) HYPERLIPIDEMIA (ICD-272.4) GERD (ICD-530.81) Sleep APnea DIABETES MELLITUS, TYPE II (ICD-250.00) ANEMIA-NOS (ICD-285.9)  Fundic Gland Polyp Atypical Chest pain    -10/11 cardiac cath neg for CAD Gout  Past Surgical History: Last updated: 05/21/2010 Ventral Hernia Repair Nissen Fundoplication Cholecystectomy Hysterectomy right knee arthroscopy May 11, 2010  Family History: Last updated: 10/20/2009 father-alive MI, DM mother alive  and well uncle with SARCOID No FH of Colon Cancer: Family History Emphysema-Mother, Father  Family History Asthma-Asthma Family History COPD -mother  Social History: Last updated: 10/20/2009 Married, live with husband Never Smoked Regular exercise-no Alcohol use-no Illicit Drug Use - no  Risk Factors: Exercise: no (05/31/2007)  Risk Factors: Smoking Status: never (06/17/2010) PMH-FH-SH reviewed for relevance  Review of Systems  The patient denies anorexia, fever, weight loss, vision loss, decreased hearing, chest pain, syncope, dyspnea on exertion, peripheral edema, prolonged cough, hemoptysis, abdominal pain, muscle weakness, and enlarged lymph nodes.    Physical Exam  General:  Well-developed,well-nourished,in no acute distress; alert,appropriate and cooperative throughout examination Head:  normocephalic and atraumatic.  she has some mild bruising left chin region from recent tooth extraction otherwise no facial swelling Eyes:  No corneal or conjunctival inflammation noted. EOMI. Perrla. Funduscopic exam benign, without hemorrhages, exudates or papilledema. Vision grossly normal. Ears:  External ear exam shows no significant lesions or deformities.  Otoscopic examination reveals clear canals, tympanic membranes are intact bilaterally without bulging, retraction, inflammation or discharge. Hearing is grossly normal bilaterally. Mouth:  Oral mucosa and oropharynx without lesions or exudates.  Teeth in good repair. Neck:  No deformities, masses, or tenderness noted. Lungs:  Normal respiratory effort, chest expands symmetrically. Lungs are clear to auscultation, no crackles or wheezes. Heart:  normal rate and regular rhythm.   Neurologic:  alert & oriented X3, cranial nerves II-XII intact, strength normal in all extremities, and gait normal.   Skin:  no rashes and no suspicious lesions.   Cervical Nodes:  No lymphadenopathy noted   Impression & Recommendations:  Problem # 1:   HEADACHE (ICD-784.0) ?tension type.  Trial of tramadol short term.  Labs and further eval if persists. Her updated medication list for this problem includes:    Atenolol-chlorthalidone 50-25 Mg Tabs (Atenolol-chlorthalidone) .[name]... Take 1 tablet by mouth once a day    Tramadol Hcl 50 Mg Tabs (Tramadol hcl) .[name]... 1-2 by mouth q 6 hours as needed severe headache  Problem # 2:  SARCOIDOSIS (ICD-135)  Problem # 3:  HYPERTENSION (ICD-401.9)  Her updated medication list for this problem includes:    Atenolol-chlorthalidone 50-25 Mg Tabs (Atenolol-chlorthalidone) .[name]... Take 1 tablet by mouth once a day    Furosemide 20 Mg Tabs (Furosemide) ..... Once daily  Problem # 4:  GOUT (ICD-274.9)  Complete Medication List: 1)  Atenolol-chlorthalidone 50-25 Mg Tabs (Atenolol-chlorthalidone) .... Take 1 tablet by mouth once a day 2)  Effexor Xr 75 Mg Cp24 (Venlafaxine hcl) .... Take 1 tablet by mouth once a day 3)  Lorazepam 1 Mg Tabs (Lorazepam) .... One every 6 hrs as needed 4)  Remicade 100 Mg Solr (Infliximab) .... Injection every 6 weeks 5)  Omnaris 50 Mcg/act Susp (Ciclesonide) .... One spray each nostril once daily 6)  Estrace 1 Mg Tabs (Estradiol) .... Take 1 tablet by mouth once a day 7)  Restasis 0.05 % Emul (Cyclosporine) .[name].. 1 drop each eye daily 8)  Lotemax 0.5 % Susp (Loteprednol etabonate) .[name].. 1 drop each eye every 6 hours as needed 9)  Simvastatin 40 Mg Tabs (Simvastatin) .... Take one tablet at bedtime start one week after completing diflucan 10)  Levbid 0.375 Mg Xr12h-tab (Hyoscyamine sulfate) .[name].. 1 by mouth two times a day as needed 11)  Prednisone 5 Mg Tabs (Prednisone) .... Take 1 tablet by mouth every other day 12)  Cpap 7 Cm  13)  Nystatin-triamcinolone 100000-0.1 Unit/gm-% Crea (Nystatin-triamcinolone) .... Apply two times a day to affected area as needed 14)  Furosemide 20 Mg Tabs (Furosemide) .... Once daily 15)  Klor-con M20 20 Meq Cr-tabs (Potassium chloride crys cr) ....  Take 3 tablet by mouth once a day 16)  Budeprion Xl 300 Mg Xr24h-tab (Bupropion hcl) .... Take 1 tablet by mouth once a day 17)  Tussicaps 10-8 Mg Xr12h-cap (Hydrocod polst-chlorphen polst) .... One by mouth two times a day as needed cough 18)  Benzonatate 100 Mg Caps (Benzonatate) .... Take one every 4hours as needed for cough 19)  Zegerid 40-1100 Mg Caps (Omeprazole-sodium bicarbonate) .... Take 1 tablet by mouth two times a day 20)  Diflucan 100 Mg Tabs (Fluconazole) .... Take one now 21)  Tramadol Hcl 50 Mg Tabs (Tramadol hcl) .[name].. 1-2 by mouth q 6 hours as needed severe headache  Patient Instructions: 1)  Followup with Dr Cato Mulligan if you have persistent or worsening headache or any new symptoms Prescriptions: TRAMADOL HCL 50 MG TABS (TRAMADOL HCL) 1-2 by mouth q 6 hours as needed severe headache  #40 x 0   Entered and Authorized by:   Evelena Peat MD   Signed by:   Evelena Peat MD on 09/23/2010   Method used:   Electronically to        [facility] (retail)       [address]       [place], [place]  [address]       Ph:  [phone]       Fax: [phone]   RxID:   [id number]    Orders Added: 1)  Est. Patient Level IV [41324]

## 2010-11-12 NOTE — Progress Notes (Signed)
Summary: question about aching in leg  Phone Note Call from Patient Call back at Home Phone [phone]   Caller: pt vm triage Call For: Swords Summary of Call: question about aching in leg  Initial call taken by: Roselle Locus,  April 25, 2008 3:35 PM  Follow-up for Phone Call        Attempted to gather additional information.  Pt states this asking is new, started since 7/9 OV.  Aching occurs sitting or standing.  Denies swelling, pain, cramping sx. Follow-up by: Sid Falcon LPN,  April 25, 2008 4:41 PM  Additional Follow-up for Phone Call Additional follow up Details #1::        unsure what the cause is...if concerned---OV no urgency Additional Follow-up by: Birdie Sons MD,  April 26, 2008 8:11 AM    Additional Follow-up for Phone Call Additional follow up Details #2::    LM to schedule ov at [phone]. Follow-up by: Rudy Jew, RN,  April 26, 2008 8:39 AM

## 2010-11-12 NOTE — Letter (Signed)
Summary: [facility] Instructions  [facility]  [address] [place], [place] [address]   Phone: [phone]  Fax: [phone]       [name]    October 29, 1960    MRN: [id number]        Procedure Day /Date: 04/29/10  Wednesday     Arrival Time:  12:30pm      Procedure Time: 1:30pm     Location of Procedure:                    _x _  [facility] (4th Floor)                        PREPARATION FOR COLONOSCOPY WITH MOVIPREP   Starting 5 days prior to your procedure _7/15/11 _ do not eat nuts, seeds, popcorn, corn, beans, peas,  salads, or any raw vegetables.  Do not take any fiber supplements (e.g. Metamucil, Citrucel, and Benefiber).  THE DAY BEFORE YOUR PROCEDURE         DATE:   04/28/10  DAY:  Tuesday  1.  Drink clear liquids the entire day-NO SOLID FOOD  2.  Do not drink anything colored red or purple.  Avoid juices with pulp.  No orange juice.  3.  Drink at least 64 oz. (8 glasses) of fluid/clear liquids during the day to prevent dehydration and help the prep work efficiently.  CLEAR LIQUIDS INCLUDE: Water Jello Ice Popsicles Tea (sugar ok, no milk/cream) Powdered fruit flavored drinks Coffee (sugar ok, no milk/cream) Gatorade Juice: apple, white grape, white cranberry  Lemonade Clear bullion, consomm, broth Carbonated beverages (any kind) Strained chicken noodle soup Hard Candy                             4.  In the morning, mix first dose of MoviPrep solution:    Empty 1 Pouch A and 1 Pouch B into the disposable container    Add lukewarm drinking water to the top line of the container. Mix to dissolve    Refrigerate (mixed solution should be used within 24 hrs)  5.  Begin drinking the prep at 5:00 p.m. The MoviPrep container is divided by 4 marks.   Every 15 minutes drink the solution down to the next mark (approximately 8 oz) until the full liter is complete.   6.  Follow completed prep with 16 oz of clear liquid of your  choice (Nothing red or purple).  Continue to drink clear liquids until bedtime.  7.  Before going to bed, mix second dose of MoviPrep solution:    Empty 1 Pouch A and 1 Pouch B into the disposable container    Add lukewarm drinking water to the top line of the container. Mix to dissolve    Refrigerate  THE DAY OF YOUR PROCEDURE      DATE:   04/29/10  DAY:  Wednesday  Beginning at  8:30 a.m. (5 hours before procedure):         1. Every 15 minutes, drink the solution down to the next mark (approx 8 oz) until the full liter is complete.  2. Follow completed prep with 16 oz. of clear liquid of your choice.    3. You may drink clear liquids until  11:30am  (2 HOURS BEFORE PROCEDURE).   MEDICATION INSTRUCTIONS  Unless otherwise instructed, you should take regular prescription medications with a small sip  of water   as early as possible the morning of your procedure.    Additional medication instructions: Hold Furosemide and Atenolo-Chlorthalidone the morning of procedure.         OTHER INSTRUCTIONS  You will need a responsible adult at least 52 years of age to accompany you and drive you home.   This person must remain in the waiting room during your procedure.  Wear loose fitting clothing that is easily removed.  Leave jewelry and other valuables at home.  However, you may wish to bring a book to read or  an iPod/MP3 player to listen to music as you wait for your procedure to start.  Remove all body piercing jewelry and leave at home.  Total time from sign-in until discharge is approximately 2-3 hours.  You should go home directly after your procedure and rest.  You can resume normal activities the  day after your procedure.  The day of your procedure you should not:   Drive   Make legal decisions   Operate machinery   Drink alcohol   Return to work  You will receive specific instructions about eating, activities and medications before you leave.    The  above instructions have been reviewed and explained to me by   Wyona Almas RN  April 20, 2010 9:08 AM     I fully understand and can verbalize these instructions _____________________________ Date _________

## 2010-11-12 NOTE — Assessment & Plan Note (Signed)
Summary: Pulmonary OV   Copy to:  Chase Picket Primary Keeva Reisen/Referring Teasha Murrillo:  Dr. Birdie Sons  CC:  Sarcoid f/u.  Pt c/o sinus pain/pressure x1 week and headaches.  She says there is no change in her breathing.[name]  History of Present Illness: Pulmonary OV    This is a 50 year old, white female with chronic pulmonary sarcoidosis.  This is been manifested by cyclic cough and mediastinal adenopathy with associated chest pain.  There are also stable pulmonary nodules.  The patient has been intolerant of systemic corticosteroids.  She also had adverse side effects to Arava(leflunamide).    On this basis we referred the patient in November 2008  to Dr. Azzie Roup,  for Remicade injections.  There has been a positive response to Rx.  December 18, 2008 11:05 AM now on pred 5/d on remecade every 6weeks  The pts cough is better.  There is some chest pain.  Pt notes some sinus pressure.  Some postnasal drip.  On Flonase only. sob is persisting awakens in am with nausea and sweats notes sinus pressure and pain  notes postnasal drip on flonase only    Current Medications (verified): 1)  Proair Hfa 108 (90 Base) Mcg/act  Aers (Albuterol Sulfate) .... 2 Puffs Three Times A Day As Needed 2)  Atenolol-Chlorthalidone 50-25 Mg Tabs (Atenolol-Chlorthalidone) .... Take 1 Tablet By Mouth Once A Day 3)  Budeprion Xl 300 Mg Tb24 (Bupropion Hcl) .... Take 1 Tablet By Mouth Once A Day 4)  Effexor Xr 75 Mg Cp24 (Venlafaxine Hcl) .... Take 1 Capsule By Mouth Once A Day 5)  Lorazepam 1 Mg Tabs (Lorazepam) .... Prn 6)  Novolog Flexpen 100 Unit/ml Soln (Insulin Aspart) .... Three Times A Day As Directed 7)  Onetouch Ultra Test  Strp (Glucose Blood) .... Tid 8)  Potassium Chloride Crys Cr 20 Meq Tbcr (Potassium Chloride Crys Cr) .... Take 1 Tablet By Mouth Two Times A Day 9)  Prednisone 5 Mg Tabs (Prednisone) .... Take 1 Tablet By Mouth Once A Day 10)  Zegerid 40-1100 Mg Caps (Omeprazole-Sodium  Bicarbonate) .... 2 By Mouth Daily 11)  Crestor 20 Mg Tabs (Rosuvastatin Calcium) .[name].. 1 By Mouth Once Daily 12)  Qvar 80 Mcg/act  Aers (Beclomethasone Dipropionate) .... Two Puffs Twice Daily 13)  Pen Needles 31g X 8 Mm  Misc (Insulin Pen Needle) .... Use As Directed 14)  Remicade 100 Mg  Solr (Infliximab) .... Injection Every 6 Weeks 15)  Fluticasone Propionate 50 Mcg/act  Susp (Fluticasone Propionate) .... 2 Sprays Each Nostril Once Daily 16)  Furosemide 20 Mg Tabs (Furosemide) .... Take One (1) By Mouth Daily 17)  Vicodin 5-500 Mg  Tabs (Hydrocodone-Acetaminophen) .... One Tab Every 6 Hours As Needed 18)  Vitamin D 1000 Unit  Caps (Cholecalciferol) .... Two Times A Day 19)  Benicar 40 Mg  Tabs (Olmesartan Medoxomil) .[name].. 1 By Mouth Once Daily 20)  Premarin 0.9 Mg Tabs (Estrogens Conjugated) .... Take 1 Tablet By Mouth Once A Day 21)  Slow Release Iron 47.5 Mg Cr-Tabs (Ferrous Sulfate) .... Once Daily 22)  Multiple Vitamin  Tabs (Multiple Vitamin) .... Once Daily 23)  Tussionex Pennkinetic Er 8-10 Mg/65ml Lqcr (Chlorpheniramine-Hydrocodone) .... Take 1 Teaspoon Every 12 Hrs As Needed Cough 24)  Restasis 0.05 % Emul (Cyclosporine) .[name].. 1 Drop Each Eye Daily 25)  Fish Oil 500 Mg Caps (Omega-3 Fatty Acids) .... Once Daily 26)  Lotemax 0.5 % Susp (Loteprednol Etabonate) .[name].. 1 Drop Each Eye Every 6 Hours As Needed  27)  Utac 500-500 Mg Tabs (Methenamine Mand-Sod Phosphate) .... Two Times A Day  Allergies (verified): 1)  ! Methotrexate  Past History  Past Medical History: EDEMA (ICD-782.3) HEADACHE (ICD-784.0) MENOPAUSAL SYNDROME (ICD-627.2) UTI (ICD-599.0) SARCOIDOSIS (ICD-135)   -CT Chest 6/08 improvement in Mediastinal LAN   -11/08 start of remecade Rx per [facility] PULMONARY NODULE (ICD-518.89)    -CT Chest 6/08 reduction in size of nodules due to Arava and Pred RX     -probable granulomas due to Sarcoidosis HYPERTENSION (ICD-401.9) HYPERLIPIDEMIA (ICD-272.4) GERD  (ICD-530.81) DIABETES MELLITUS, TYPE II (ICD-250.00) ANEMIA-NOS (ICD-285.9)    (09/24/2008)  Review of Systems       The patient complains of shortness of breath with activity, shortness of breath at rest, non-productive cough, and indigestion.  The patient denies productive cough, coughing up blood, chest pain, irregular heartbeats, acid heartburn, loss of appetite, weight change, abdominal pain, difficulty swallowing, sore throat, tooth/dental problems, headaches, nasal congestion/difficulty breathing through nose, sneezing, itching, ear ache, anxiety, depression, hand/feet swelling, joint stiffness or pain, rash, change in color of mucus, and fever.    Vital Signs:  Patient profile:   50 year old female Height:      66 inches (167.64 cm) Weight:      285.50 pounds (129.77 kg) BMI:     46.25 O2 Sat:      96 % Temp:     97.6 degrees F (36.44 degrees C) oral Pulse rate:   86 / minute BP sitting:   120 / 76  (left arm) Cuff size:   large  Vitals Entered By: Michel Bickers CMA (December 18, 2008 10:46 AM)  O2 Sat at Rest %:  96 O2 Flow:  room air  Physical Exam  Additional Exam:  Gen: Pleasant, obese , in no distress ENT: no lesions, no postnasal drip Neck: No JVD, no TMG, no carotid bruits Lungs: no use of accessory muscles, no dullness to percussion,  distant BS,  no wheeze or rhonchi Cardiovascular: RRR, heart sounds normal, no murmurs or gallops, no peripheral edema Musculoskeletal: No deformities, no cyanosis or clubbing     Pulmonary Function Test Height (in.): 66 Gender: Female  Impression & Recommendations:  Problem # 1:  SARCOIDOSIS (ICD-135) Assessment Improved Stable sarcoidosis with positive response to Remecade   plan: maintain pred  at 5mg /d remecade per Rheumatology  Problem # 2:  ALLERGIC RHINITIS (ICD-477.9) Assessment: Deteriorated severe allergic rhinitis with flare plan: xyzal daily  Her updated medication list for this problem includes:     Fluticasone Propionate 50 Mcg/act Susp (Fluticasone propionate) .[name]... 2 sprays each nostril once daily    Xyzal 5 Mg Tabs (Levocetirizine dihydrochloride) ..... By mouth daily  Medications Added to Medication List This Visit: 1)  Xyzal 5 Mg Tabs (Levocetirizine dihydrochloride) .... By mouth daily  Complete Medication List: 1)  Proair Hfa 108 (90 Base) Mcg/act Aers (Albuterol sulfate) .... 2 puffs three times a day as needed 2)  Atenolol-chlorthalidone 50-25 Mg Tabs (Atenolol-chlorthalidone) .... Take 1 tablet by mouth once a day 3)  Budeprion Xl 300 Mg Tb24 (Bupropion hcl) .... Take 1 tablet by mouth once a day 4)  Effexor Xr 75 Mg Cp24 (Venlafaxine hcl) .... Take 1 capsule by mouth once a day 5)  Lorazepam 1 Mg Tabs (Lorazepam) .... Prn 6)  Novolog Flexpen 100 Unit/ml Soln (Insulin aspart) .... Three times a day as directed 7)  Onetouch Ultra Test Strp (Glucose blood) .... Tid 8)  Potassium Chloride Crys Cr  20 Meq Tbcr (Potassium chloride crys cr) .... Take 1 tablet by mouth two times a day 9)  Prednisone 5 Mg Tabs (Prednisone) .... Take 1 tablet by mouth once a day 10)  Zegerid 40-1100 Mg Caps (Omeprazole-sodium bicarbonate) .... 2 by mouth daily 11)  Crestor 20 Mg Tabs (Rosuvastatin calcium) .[name].. 1 by mouth once daily 12)  Qvar 80 Mcg/act Aers (Beclomethasone dipropionate) .... Two puffs twice daily 13)  Pen Needles 31g X 8 Mm Misc (Insulin pen needle) .... Use as directed 14)  Remicade 100 Mg Solr (Infliximab) .... Injection every 6 weeks 15)  Fluticasone Propionate 50 Mcg/act Susp (Fluticasone propionate) .... 2 sprays each nostril once daily 16)  Furosemide 20 Mg Tabs (Furosemide) .... Take one (1) by mouth daily 17)  Vicodin 5-500 Mg Tabs (Hydrocodone-acetaminophen) .... One tab every 6 hours as needed 18)  Vitamin D 1000 Unit Caps (Cholecalciferol) .... Two times a day 19)  Benicar 40 Mg Tabs (Olmesartan medoxomil) .[name].. 1 by mouth once daily 20)  Premarin 0.9 Mg Tabs (Estrogens  conjugated) .... Take 1 tablet by mouth once a day 21)  Slow Release Iron 47.5 Mg Cr-tabs (Ferrous sulfate) .... Once daily 22)  Multiple Vitamin Tabs (Multiple vitamin) .... Once daily 23)  Tussionex Pennkinetic Er 8-10 Mg/65ml Lqcr (Chlorpheniramine-hydrocodone) .... Take 1 teaspoon every 12 hrs as needed cough 24)  Restasis 0.05 % Emul (Cyclosporine) .[name].. 1 drop each eye daily 25)  Fish Oil 500 Mg Caps (Omega-3 fatty acids) .... Once daily 26)  Lotemax 0.5 % Susp (Loteprednol etabonate) .[name].. 1 drop each eye every 6 hours as needed 27)  Utac 500-500 Mg Tabs (Methenamine mand-sod phosphate) .... Two times a day 28)  Xyzal 5 Mg Tabs (Levocetirizine dihydrochloride) .... By mouth daily  Other Orders: Est. Patient Level IV ([address])  Patient Instructions: 1)  Trial Xyzal one daily until samples gone 2)  No other medication changes 3)  Return 3 months 4)  The medication list was reviewed and reconciled.  All changed / newly prescribed medications were explained.  A complete medication list was provided to the patient / caregiver.   Appended Document: Pulmonary OV fax Azzie Roup

## 2010-11-12 NOTE — Assessment & Plan Note (Signed)
Summary: back problem/possible bladder infection/jls   Vital Signs:  Patient Profile:   50 Years Old Female Height:     66 inches Weight:      185 pounds Temp:     97.9 degrees F Pulse rate:   82 / minute BP sitting:   138 / 82  (left arm) Cuff size:   large  Vitals Entered By: Gladis Riffle, RN (August 12, 2008 8:49 AM)                 Referred by:  Chase Picket PCP:  Dr. Birdie Sons  Chief Complaint:  thinks has uti, c/o flank pain since last week, and Back pain.  History of Present Illness:  Back Pain      [name] who presents with Back pain.  The symptoms began 5 days ago.  The intensity is described as moderate.  The patient reports dysuria, but denies fever, chills, weakness, loss of sensation, fecal incontinence, urinary incontinence, urinary retention, rest pain, inability to work, and inability to care for self.  The pain is located in the right low back.  The pain began gradually.    Past Medical History: EDEMA (ICD-782.3) HEADACHE (ICD-784.0) MENOPAUSAL SYNDROME (ICD-627.2) UTI (ICD-599.0) SARCOIDOSIS (ICD-135) PULMONARY NODULE (ICD-518.89) HYPERTENSION (ICD-401.9) HYPERLIPIDEMIA (ICD-272.4) GERD (ICD-530.81) DIABETES MELLITUS, TYPE II (ICD-250.00) ANEMIA-NOS (ICD-285.9)    Past Surgical History: Ventral Hernia Repair Nissen Fundoplication Cholecystectomy Hysterectomy  Social History: Married Never Smoked Regular exercise-no Alcohol use-no  Family History: father-alive MI, DM mother alive and well uncle with SARCOID no other complaints in a complete ROS     Updated Prior Medication List: PROAIR HFA 108 (90 BASE) MCG/ACT  AERS (ALBUTEROL SULFATE) 2 puffs three times a day as needed ATENOLOL-CHLORTHALIDONE 50-25 MG TABS (ATENOLOL-CHLORTHALIDONE) 1/1/2 daily BUDEPRION XL 300 MG TB24 (BUPROPION HCL) Take 1 tablet by mouth once a day EFFEXOR XR 75 MG CP24 (VENLAFAXINE HCL) Take 1 capsule by mouth once a day LORAZEPAM 1 MG TABS  (LORAZEPAM) prn NOVOLOG FLEXPEN 100 UNIT/ML SOLN (INSULIN ASPART) three times a day as directed ONETOUCH ULTRA TEST  STRP (GLUCOSE BLOOD) tid POTASSIUM CHLORIDE CRYS CR 20 MEQ TBCR (POTASSIUM CHLORIDE CRYS CR) Take 1 tablet by mouth two times a day PREDNISONE 10 MG  TABS (PREDNISONE) Take 1 tablet by mouth once a day ZEGERID 40-1100 MG CAPS (OMEPRAZOLE-SODIUM BICARBONATE) 2 by mouth daily CRESTOR 20 MG TABS (ROSUVASTATIN CALCIUM) 1 by mouth once daily QVAR 80 MCG/ACT  AERS (BECLOMETHASONE DIPROPIONATE) two puffs twice daily PEN NEEDLES 31G X 8 MM  MISC (INSULIN PEN NEEDLE) USE AS DIRECTED REMICADE 100 MG  SOLR (INFLIXIMAB) Injection every 6 weeks FLUTICASONE PROPIONATE 50 MCG/ACT  SUSP (FLUTICASONE PROPIONATE) 2 sprays each nostril once daily FUROSEMIDE 20 MG TABS (FUROSEMIDE) Take one (1) by mouth daily VICODIN 5-500 MG  TABS (HYDROCODONE-ACETAMINOPHEN) One tab every 6 hours as needed VITAMIN D 1000 UNIT  CAPS (CHOLECALCIFEROL) two times a day BENICAR 40 MG  TABS (OLMESARTAN MEDOXOMIL) 1 by mouth once daily PREMARIN 0.625 MG TABS (ESTROGENS CONJUGATED) take one tab by mouth once daily SLOW RELEASE IRON 47.5 MG CR-TABS (FERROUS SULFATE) once daily MULTIPLE VITAMIN  TABS (MULTIPLE VITAMIN) once daily TUSSIONEX PENNKINETIC ER 8-10 MG/5ML LQCR (CHLORPHENIRAMINE-HYDROCODONE) as needed NITROFURANTOIN MACROCRYSTAL 100 MG CAPS (NITROFURANTOIN MACROCRYSTAL) Take 1 capsule by mouth at bedtime  Current Allergies (reviewed today): ! METHOTREXATE      Physical Exam  General:     Well-developed,well-nourished,in no acute distress; alert,appropriate and  cooperative throughout examination obese Neck:     No deformities, masses, or tenderness noted. Lungs:     Normal respiratory effort, chest expands symmetrically. Lungs are clear to auscultation, no crackles or wheezes. Abdomen:     Bowel sounds positive,abdomen soft and non-tender without masses, organomegaly or hernias  noted. overweight Genitalia:     no cva or suprapubic discomofort    Impression & Recommendations:  Problem # 1:  UTI (ICD-599.0)  The following medications were removed from the medication list:    Augmentin 875-125 Mg Tabs (Amoxicillin-pot clavulanate) .[name]..[name] Two times a day for 10 days  Her updated medication list for this problem includes:    Nitrofurantoin Macrocrystal 100 Mg Caps (Nitrofurantoin macrocrystal) .[name]... Take 1 capsule by mouth at bedtime    Cipro 500 Mg Tabs (Ciprofloxacin hcl) .[name]... 1 by mouth 2 times daily  Orders: UA Dipstick w/o Micro (automated)  (81003) T-Culture, Urine ([id number])   Complete Medication List: 1)  Proair Hfa 108 (90 Base) Mcg/act Aers (Albuterol sulfate) .... 2 puffs three times a day as needed 2)  Atenolol-chlorthalidone 50-25 Mg Tabs (Atenolol-chlorthalidone) .... 1/1/2 daily 3)  Budeprion Xl 300 Mg Tb24 (Bupropion hcl) .... Take 1 tablet by mouth once a day 4)  Effexor Xr 75 Mg Cp24 (Venlafaxine hcl) .... Take 1 capsule by mouth once a day 5)  Lorazepam 1 Mg Tabs (Lorazepam) .... Prn 6)  Novolog Flexpen 100 Unit/ml Soln (Insulin aspart) .... Three times a day as directed 7)  Onetouch Ultra Test Strp (Glucose blood) .... Tid 8)  Potassium Chloride Crys Cr 20 Meq Tbcr (Potassium chloride crys cr) .... Take 1 tablet by mouth two times a day 9)  Prednisone 10 Mg Tabs (Prednisone) .... Take 1 tablet by mouth once a day 10)  Zegerid 40-1100 Mg Caps (Omeprazole-sodium bicarbonate) .... 2 by mouth daily 11)  Crestor 20 Mg Tabs (Rosuvastatin calcium) .[name].. 1 by mouth once daily 12)  Qvar 80 Mcg/act Aers (Beclomethasone dipropionate) .... Two puffs twice daily 13)  Pen Needles 31g X 8 Mm Misc (Insulin pen needle) .... Use as directed 14)  Remicade 100 Mg Solr (Infliximab) .... Injection every 6 weeks 15)  Fluticasone Propionate 50 Mcg/act Susp (Fluticasone propionate) .... 2 sprays each nostril once daily 16)  Furosemide 20 Mg Tabs (Furosemide)  .... Take one (1) by mouth daily 17)  Vicodin 5-500 Mg Tabs (Hydrocodone-acetaminophen) .... One tab every 6 hours as needed 18)  Vitamin D 1000 Unit Caps (Cholecalciferol) .... Two times a day 19)  Benicar 40 Mg Tabs (Olmesartan medoxomil) .[name].. 1 by mouth once daily 20)  Premarin 0.625 Mg Tabs (Estrogens conjugated) .... Take one tab by mouth once daily 21)  Slow Release Iron 47.5 Mg Cr-tabs (Ferrous sulfate) .... Once daily 22)  Multiple Vitamin Tabs (Multiple vitamin) .... Once daily 23)  Tussionex Pennkinetic Er 8-10 Mg/20ml Lqcr (Chlorpheniramine-hydrocodone) .... As needed 24)  Nitrofurantoin Macrocrystal 100 Mg Caps (Nitrofurantoin macrocrystal) .... Take 1 capsule by mouth at bedtime 25)  Cipro 500 Mg Tabs (Ciprofloxacin hcl) .[name].. 1 by mouth 2 times daily   Patient Instructions: 1)  hold macrodanitn while on CIPRO   Prescriptions: CIPRO 500 MG TABS (CIPROFLOXACIN HCL) 1 by mouth 2 times daily  #10 x 0   Entered and Authorized by:   Birdie Sons MD   Signed by:   Birdie Sons MD on 08/12/2008   Method used:   Electronically to        [facility] (retail)  [address]       [place], [place]  [address]       Ph: [phone]       Fax: [phone]   RxID:   [id number]  ]  Appended Document: back problem/possible bladder infection/jls  Laboratory Results   Urine Tests   Date/Time Reported: August 12, 2008 11:40 AM   Routine Urinalysis   Color: yellow Appearance: Clear Glucose: negative   (Normal Range: Negative) Bilirubin: 1+   (Normal Range: Negative) Ketone: negative   (Normal Range: Negative) Spec. Gravity: 1.020   (Normal Range: 1.003-1.035) Blood: trace-intact   (Normal Range: Negative) pH: 7.0   (Normal Range: 5.0-8.0) Protein: trace   (Normal Range: Negative) Urobilinogen: 0.2   (Normal Range: 0-1) Nitrite: negative   (Normal Range: Negative) Leukocyte Esterace: 1+   (Normal Range: Negative)     Comments: Rita Ohara  August 12, 2008 11:40 AM

## 2010-11-12 NOTE — Medication Information (Signed)
Summary: Order for Diabetic Testing Supplies  Order for Diabetic Testing Supplies   Imported By: Maryln Gottron 01/08/2010 13:22:18  _____________________________________________________________________  External Attachment:    Type:   Image     Comment:   External Document

## 2010-11-12 NOTE — Progress Notes (Signed)
Summary: New flutter valve     [name] aware she may come by to pick up a new flutter valve. Michel Bickers [facility]  November 22, 2008 1:57 PM     ---- Converted from flag ---- ---- 11/21/2008 5:32 PM, Storm Frisk MD wrote: it is a flutter valve  ok for her to have one to use qid   pw  ---- 11/21/2008 2:37 PM, Michel Bickers CMA wrote: pw,  I spoke with Ms. Dols and she said that she saw you at the hospital and you told her she could come by the office to pick something up to help her breathing. She could not remember what the item was called. Please help/advise.  lc ------------------------------

## 2010-11-12 NOTE — Assessment & Plan Note (Signed)
Summary: neck pain/ccm  Medications Added REMICADE 100 MG  SOLR (INFLIXIMAB) Injection every 8weeks DICLOFENAC SODIUM 75 MG TBEC (DICLOFENAC SODIUM) 1 by mouth two times a day with food for 7 days        Vital Signs:  Patient Profile:   50 Years Old Female Height:     66 inches Weight:      276 pounds Temp:     98.4 degrees F Pulse rate:   92 / minute BP sitting:   142 / 88  (left arm) Cuff size:   large  Vitals Entered By: Gladis Riffle, RN (November 08, 2007 10:19 AM)                 Chief Complaint:  c/o right neck pain X 2-3 days--states CBG's are doing well.  History of Present Illness: 3 DAY history of neck pain. Right sided only. pain is present at rest and with movement. No trauma. No other assodicated sxs. No modifying factors. Sleeping well. Intensity 8/10.   Current Allergies (reviewed today): ! METHOTREXATE  Past Medical History:    Reviewed history from 09/15/2007 and no changes required:       Anemia-NOS       Diabetes mellitus, type II       GERD       Hyperlipidemia       Hypertension       pulmonary nodule       OSA       Vocal Cord dysfunction with recurrent cough       Asthma vs Vocal cord dysfundtion       SARCOID  Past Surgical History:    Reviewed history from 08/21/2006 and no changes required:       Ventral Hernia Repair       Nissen Fundoplication       Cholecystectomy       Hysterectomy   Family History:    Reviewed history from 09/15/2007 and no changes required:       father-alive MI, DM       mother alive and well       uncle with SARCOID  Social History:    Reviewed history from 05/31/2007 and no changes required:       Married       Never Smoked       Regular exercise-no       Alcohol use-no    Review of Systems       no other complaints in a complete ROS    Physical Exam  General:     Well-developed,well-nourished,in no acute distress; alert,appropriate and cooperative throughout examination Head:  normocephalic and atraumatic.   Eyes:     pupils equal and pupils round.   Ears:     R ear normal and L ear normal.   Neck:     No deformities, masses, or tenderness noted. Chest Wall:     No deformities, masses, or tenderness noted. Lungs:     Normal respiratory effort, chest expands symmetrically. Lungs are clear to auscultation, no crackles or wheezes. Heart:     Normal rate and regular rhythm. S1 and S2 normal without gallop, murmur, click, rub or other extra sounds.    Impression & Recommendations:  Problem # 1:  SHOULDER PAIN, RIGHT (ICD-719.41) unclear etiology---pain is really located in supraclavicular area only trial short course of NSAIDS---if pain persists she will need CT-soft tissue neck and supraclavicular area.  Her updated medication list for this problem  includes:    Diclofenac Sodium 75 Mg Tbec (Diclofenac sodium) .[name]... 1 by mouth two times a day with food for 7 days   Complete Medication List: 1)  Albuterol 90 Mcg/act Aers (Albuterol) .... Inhale 2 puff as directed three times a day 2)  Atenolol-chlorthalidone 50-25 Mg Tabs (Atenolol-chlorthalidone) .... 1/1/2 daily 3)  Budeprion Xl 300 Mg Tb24 (Bupropion hcl) .... Take 1 tablet by mouth once a day 4)  Effexor Xr 75 Mg Cp24 (Venlafaxine hcl) .... Take 1 capsule by mouth once a day 5)  Lorazepam 1 Mg Tabs (Lorazepam) .... Prn 6)  Novolog Flexpen 100 Unit/ml Soln (Insulin aspart) 7)  Onetouch Ultra Test Strp (Glucose blood) 8)  Potassium Chloride Crys Cr 20 Meq Tbcr (Potassium chloride crys cr) .... Take 1 tablet by mouth once a day 9)  Prednisone 10 Mg Tabs (Prednisone) .[name].. 1 1/2 once daily 10)  Reglan 10 Mg Tabs (Metoclopramide hcl) .... Take 1 tablet by mouth twice a day 11)  Zegerid 40-1100 Mg Caps (Omeprazole-sodium bicarbonate) .... 2 by mouth daily 12)  Crestor 20 Mg Tabs (Rosuvastatin calcium) .[name].. 1 by mouth once daily 13)  Qvar 80 Mcg/act Aers (Beclomethasone dipropionate) .... Two puffs twice  daily 14)  Pen Needles 31g X 8 Mm Misc (Insulin pen needle) .... Use as directed need f/u appt b-4 addtional refills 15)  Premarin 0.3 Mg Tabs (Estrogens conjugated) .... Once daily 16)  Remicade 100 Mg Solr (Infliximab) .... Injection every 8weeks 17)  Diclofenac Sodium 75 Mg Tbec (Diclofenac sodium) .[name].. 1 by mouth two times a day with food for 7 days     Prescriptions: DICLOFENAC SODIUM 75 MG TBEC (DICLOFENAC SODIUM) 1 by mouth two times a day with food for 7 days  #14 x 0   Entered and Authorized by:   Birdie Sons MD   Signed by:   Birdie Sons MD on 11/08/2007   Method used:   Electronically sent to ...       [facility]       [address]       [place], [place]  [address]       Ph: [phone]       Fax: [phone]   RxID:   [id number]  ]

## 2010-11-12 NOTE — Medication Information (Signed)
Summary: Duaine Dredge for Zegerid/[facility]  Refill Autho for Zegerid/[facility]   Imported By: Sherian Rein 02/19/2010 10:31:56  _____________________________________________________________________  External Attachment:    Type:   Image     Comment:   External Document

## 2010-11-12 NOTE — Progress Notes (Signed)
Summary: gout?  Phone Note Call from Patient   Caller: Patient Call For: Birdie Sons MD Summary of Call: [name] having a flare of gout left foot and would like RX. [name]) [phone] Husband called again to check.  Rudy Jew, RN  March 25, 2010 2:41 PM  Initial call taken by: Lynann Beaver CMA,  March 25, 2010 8:09 AM  Follow-up for Phone Call        see Rx---prednisone Follow-up by: Birdie Sons MD,  March 25, 2010 3:42 PM  Additional Follow-up for Phone Call Additional follow up Details #1::        Phone Call Completed Additional Follow-up by: Rudy Jew, RN,  March 25, 2010 4:51 PM    New/Updated Medications: PREDNISONE 10 MG  TABS (PREDNISONE) 3 po qd for 3 days, then 2 po qd for 3 days, then 1 po qd for 3 days, then 1/2 po qd for 3 days , then resume usual prednisone dose Prescriptions: PREDNISONE 10 MG  TABS (PREDNISONE) 3 po qd for 3 days, then 2 po qd for 3 days, then 1 po qd for 3 days, then 1/2 po qd for 3 days , then resume usual prednisone dose  #25 x 0   Entered and Authorized by:   Birdie Sons MD   Signed by:   Birdie Sons MD on 03/25/2010   Method used:   Electronically to        [facility] (retail)       [address]       [place], [place]  [address]       Ph: [phone]       Fax: [phone]   RxID:   [id number]   Appended Document: gout? confirmed that the prednisone is for gout.  Pt's husband was questioning what it was for?

## 2010-11-12 NOTE — Assessment & Plan Note (Signed)
Summary: headaches//ccm   Vital Signs:  Patient profile:   50 year old female Temp:     97.8 degrees F Pulse rate:   80 / minute Resp:     20 per minute BP sitting:   118 / 78  (left arm) Cuff size:   large  Vitals Entered By: Gladis Riffle, RN (July 23, 2009 12:01 PM)   Primary Care Provider:  Leeanne Deed   History of Present Illness: Still with headache---reviewed previous notes, reviewed CT scan. headache is centrally located behind eyes.  pain is intermittent---she is unable to predict when no known causative agents no new exposures  All other systems reviewed and were negative   Preventive Screening-Counseling & Management  Alcohol-Tobacco     Smoking Status: never  Current Problems (verified): 1)  Headache  (ICD-784.0) 2)  Obesity  (ICD-278.00) 3)  Sleep Apnea  (ICD-780.57) 4)  Nonspecific Abnormal Toxicological Findings  (ICD-796.0) 5)  Menopausal Syndrome  (ICD-627.2) 6)  Sarcoidosis  (ICD-135) 7)  Hypertension  (ICD-401.9) 8)  Hyperlipidemia  (ICD-272.4) 9)  Gerd  (ICD-530.81) 10)  Diabetes Mellitus, Type II  (ICD-250.00)  Current Medications (verified): 1)  Atenolol-Chlorthalidone 50-25 Mg Tabs (Atenolol-Chlorthalidone) .... Take 1 Tablet By Mouth Once A Day 2)  Budeprion Xl 300 Mg Tb24 (Bupropion Hcl) .... Take 1 Tablet By Mouth Once A Day 3)  Effexor Xr 75 Mg Cp24 (Venlafaxine Hcl) .... Take 1 Capsule By Mouth Two Times A Day 4)  Lorazepam 1 Mg Tabs (Lorazepam) .... One Every 6 Hrs As Needed 5)  Novolog Flexpen 100 Unit/ml Soln (Insulin Aspart) .... Three Times A Day As Directed 6)  Onetouch Ultra Test  Strp (Glucose Blood) .... Tid 7)  Potassium Chloride Crys Cr 20 Meq Tbcr (Potassium Chloride Crys Cr) .[name].. 1 Three Times A Day 8)  Prednisone 10 Mg Tabs (Prednisone) .[name].. 1 in Am   1/2 in Pm 9)  Zegerid 40-1100 Mg Caps (Omeprazole-Sodium Bicarbonate) .... One Twice Daily 10)  Crestor 20 Mg Tabs (Rosuvastatin Calcium) .[name].. 1 By Mouth Once Daily 11)   Pen Needles 31g X 8 Mm  Misc (Insulin Pen Needle) .... Use As Directed 12)  Remicade 100 Mg  Solr (Infliximab) .... Injection Every 6 Weeks 13)  Fluticasone Propionate 50 Mcg/act  Susp (Fluticasone Propionate) .... 2 Sprays Each Nostril Once Daily 14)  Furosemide 20 Mg Tabs (Furosemide) .... Take One (1) By Mouth Daily 15)  Vicodin 5-500 Mg  Tabs (Hydrocodone-Acetaminophen) .... One Tab Every 6 Hours As Needed 16)  Vitamin D 1000 Unit  Caps (Cholecalciferol) .... Two Times A Day 17)  Estrace 1 Mg Tabs (Estradiol) .... Take 1 Tablet By Mouth Once A Day 18)  Slow Release Iron 47.5 Mg Cr-Tabs (Ferrous Sulfate) .... Once Daily 19)  Multiple Vitamin  Tabs (Multiple Vitamin) .... Once Daily 20)  Tussionex Pennkinetic Er 8-10 Mg/15ml Lqcr (Chlorpheniramine-Hydrocodone) .... Take 1 Teaspoon Every 12 Hrs As Needed Cough 21)  Restasis 0.05 % Emul (Cyclosporine) .[name].. 1 Drop Each Eye Daily 22)  Lotemax 0.5 % Susp (Loteprednol Etabonate) .[name].. 1 Drop Each Eye Every 6 Hours As Needed 23)  Utac 500-500 Mg Tabs (Methenamine Mand-Sod Phosphate) .... Two Times A Day 24)  Zantac 150 Mg Tabs (Ranitidine Hcl) .... One Daily 25)  Tessalon Perles 100 Mg  Caps (Benzonatate) .... One To Two By Mouth 3-4 Times Daily 26)  Nystatin 100000 Unit/ml Susp (Nystatin) .... Swish and Swallow 5ml Qid 27)  Vicodin 5-500 Mg Tabs (Hydrocodone-Acetaminophen) .... Take  1 Tablet By Mouth Four Times A Day As Needed Headache  Allergies: 1)  ! Methotrexate  Comments:  Nurse/Medical Assistant: c/o chest, nasal and ear congestion with headache and slight cough x 1 week  The patient's medications and allergies were reviewed with the patient and were updated in the Medication and Allergy Lists. Gladis Riffle, RN (July 23, 2009 12:03 PM)  Flu Vaccine Consent Questions     Do you have a history of severe allergic reactions to this vaccine? no    Any prior history of allergic reactions to egg and/or gelatin? no    Do you have a  sensitivity to the preservative Thimersol? no    Do you have a past history of Guillan-Barre Syndrome? no    Do you currently have an acute febrile illness? no    Have you ever had a severe reaction to latex? no    Vaccine information given and explained to patient? yes    Are you currently pregnant? no    Lot Number:[id number]   Exp Date:04/09/2010   Site Given  Left Deltoid IM    Impression & Recommendations:  Problem # 1:  HEADACHE (ICD-784.0) unclear etiology will try to address headache by eliminating or decreaseing a number of medications---see new med list  total time 20 minutes allcounselling she will keep headache record and see me next week Her updated medication list for this problem includes:    Atenolol-chlorthalidone 50-25 Mg Tabs (Atenolol-chlorthalidone) .[name]... Take 1 tablet by mouth once a day    Vicodin 5-500 Mg Tabs (Hydrocodone-acetaminophen) ..... One tab every 6 hours as needed  Complete Medication List: 1)  Atenolol-chlorthalidone 50-25 Mg Tabs (Atenolol-chlorthalidone) .... Take 1 tablet by mouth once a day 2)  Budeprion Xl 300 Mg Tb24 (Bupropion hcl) .... Take 1/2 tablet by mouth once a day 3)  Effexor Xr 75 Mg Cp24 (Venlafaxine hcl) .... Take 1 tablet by mouth once a day 4)  Lorazepam 1 Mg Tabs (Lorazepam) .... One every 6 hrs as needed 5)  Novolog Flexpen 100 Unit/ml Soln (Insulin aspart) .... Three times a day as directed 6)  Onetouch Ultra Test Strp (Glucose blood) .... Tid 7)  Potassium Chloride Crys Cr 20 Meq Tbcr (Potassium chloride crys cr) .[name].. 1 three times a day 8)  Prednisone 10 Mg Tabs (Prednisone) .[name].. 1 in am   1/2 in pm 9)  Zegerid 40-1100 Mg Caps (Omeprazole-sodium bicarbonate) .... Take 1 tablet by mouth once a day 10)  Pen Needles 31g X 8 Mm Misc (Insulin pen needle) .... Use as directed 11)  Remicade 100 Mg Solr (Infliximab) .... Injection every 6 weeks 12)  Fluticasone Propionate 50 Mcg/act Susp (Fluticasone propionate) .... 2 sprays each  nostril once daily 13)  Furosemide 20 Mg Tabs (Furosemide) .... Take one (1) by mouth daily 14)  Vicodin 5-500 Mg Tabs (Hydrocodone-acetaminophen) .... One tab every 6 hours as needed 15)  Estrace 1 Mg Tabs (Estradiol) .... Take 1 tablet by mouth once a day 16)  Restasis 0.05 % Emul (Cyclosporine) .[name].. 1 drop each eye daily 17)  Lotemax 0.5 % Susp (Loteprednol etabonate) .[name].. 1 drop each eye every 6 hours as needed 18)  Utac 500-500 Mg Tabs (Methenamine mand-sod phosphate) .... Two times a day  Other Orders: Admin 1st Vaccine ([address]) Flu Vaccine 80yrs + ([address])  Patient Instructions: 1)  see me next week

## 2010-11-12 NOTE — Progress Notes (Signed)
Summary: different rx  Phone Note Call from Patient Call back at Home Phone [phone]   Caller: patient vm triage  Call For: swords  Summary of Call: swords called in Veramist yesterday.  It is not covered by insurance any more.  Could [facility] call in something the [facility]  Initial call taken by: Roselle Locus,  December 22, 2007 9:46 AM  Follow-up for Phone Call        LM to use Veramyst meanwhile until Dr. Cato Mulligan returns. ..................................................................Marland KitchenRudy Jew, RN  December 22, 2007 10:17 AM   Additional Follow-up for Phone Call Additional follow up Details #1::        Patient aware of Dr. Cato Mulligan sending in med to [facility]. Additional Follow-up by: Rudy Jew, RN,  December 25, 2007 8:04 AM    New/Updated Medications: FLUTICASONE PROPIONATE 50 MCG/ACT  SUSP (FLUTICASONE PROPIONATE) 2 sprays each nostril once daily   Prescriptions: FLUTICASONE PROPIONATE 50 MCG/ACT  SUSP (FLUTICASONE PROPIONATE) 2 sprays each nostril once daily  #1 vial x 3   Entered and Authorized by:   Birdie Sons MD   Signed by:   Birdie Sons MD on 12/23/2007   Method used:   Electronically sent to ...       [facility]       [address]       [place], [place]  [address]       Ph: [phone]       Fax: [phone]   RxID:   [id number]

## 2010-11-12 NOTE — Letter (Signed)
Summary: CMN for CPAP Supplies/[facility]  CMN for CPAP Supplies/[facility]   Imported By: Sherian Rein 11/24/2009 10:12:57  _____________________________________________________________________  External Attachment:    Type:   Image     Comment:   External Document

## 2010-11-12 NOTE — Progress Notes (Signed)
Summary: headache, Rx Vicodin  Phone Note Call from Patient   Caller: Patient Call For: Dr. Cato Mulligan Summary of Call: Pt. calls stating she is having headaches unrelieved with Tylenol, and wants to know if she can have a Rx [facility]) [phone] Initial call taken by: Lynann Beaver CMA,  Mar 05, 2008 3:19 PM  Follow-up for Phone Call        vicodin 5/500 1 by mouth q 6 hours as needed #10/0refills Follow-up by: Birdie Sons MD,  Mar 05, 2008 5:13 PM  Additional Follow-up for Phone Call Additional follow up Details #1::        Rx called in, pt informed Additional Follow-up by: Sid Falcon LPN,  Mar 05, 2008 5:19 PM    New/Updated Medications: VICODIN 5-500 MG  TABS (HYDROCODONE-ACETAMINOPHEN) One tab every 6 hours as needed   Prescriptions: VICODIN 5-500 MG  TABS (HYDROCODONE-ACETAMINOPHEN) One tab every 6 hours as needed  #10 x 0   Entered by:   Sid Falcon LPN   Authorized by:   Birdie Sons MD   Signed by:   Sid Falcon LPN on 62/13/0865   Method used:   Telephoned to ...       [facility]       [address]       [place], [place]  [address]       Ph: [phone]       Fax: [phone]   RxID:   [id number]

## 2010-11-12 NOTE — Assessment & Plan Note (Signed)
Summary: Pulmonary OV   Copy to:  Dr. Delford Field Primary Provider/Referring Provider:  Birdie Sons, MD   CC:  Follow up.  Pt states breathing is doing well overall.  Slight nonprod cough x 2 wks.  Denies SOB, wheezing, and chest tightness. [name]  History of Present Illness: 50 year old, white female with chronic pulmonary sarcoidosis.  This is been manifested by cyclic cough and mediastinal adenopathy with associated chest pain.  There are also stable pulmonary nodules.  She has been intolerant of systemic corticosteroids &  had adverse side effects to Arava(leflunamide). She was referred the patient in November 2008  to Dr. Azzie Roup,  for Remicade injections.  There has been a positive response to Rx. She had severe GERD s/p fundoplication in 2001.  October 19, 2010 9:52 AM Recently doing ok, has min cough.  Dyspnea is at baseline .  No other new issues. Pt denies any increase in rescue therapy over baseline, denies waking up needing it or having any early am or nocturnal exacerbations of coughing/wheezing/or dyspnea. Pt denies any significant sore throat, nasal congestion or excess secretions, fever, chills, sweats, unintended weight loss, pleurtic or exertional chest pain, orthopnea PND, or leg swelling   Current Medications (verified): 1)  Atenolol-Chlorthalidone 50-25 Mg Tabs (Atenolol-Chlorthalidone) .... Take 1 Tablet By Mouth Once A Day 2)  Effexor Xr 75 Mg Cp24 (Venlafaxine Hcl) .... Take 1 Tablet By Mouth Once A Day 3)  Lorazepam 1 Mg Tabs (Lorazepam) .... One Every 6 Hrs As Needed 4)  Remicade 100 Mg  Solr (Infliximab) .... Injection Every 6 Weeks 5)  Omnaris 50 Mcg/act Susp (Ciclesonide) .... One Spray Each Nostril Once Daily 6)  Estrace 1 Mg Tabs (Estradiol) .... Take 1 Tablet By Mouth Once A Day 7)  Restasis 0.05 % Emul (Cyclosporine) .[name].. 1 Drop Each Eye Daily 8)  Lotemax 0.5 % Susp (Loteprednol Etabonate) .[name].. 1 Drop Each Eye Every 6 Hours As Needed 9)  Simvastatin 40 Mg  Tabs (Simvastatin) .... Take One Tablet At Bedtime Start One Week After Completing Diflucan 10)  Levbid 0.375 Mg Xr12h-Tab (Hyoscyamine Sulfate) .[name].. 1 By Mouth Two Times A Day As Needed 11)  Prednisone 5 Mg Tabs (Prednisone) .... Take 1 Tablet By Mouth Every Other Day 12)  Cpap 7 Cm 13)  Nystatin-Triamcinolone 100000-0.1 Unit/gm-% Crea (Nystatin-Triamcinolone) .... Apply Two Times A Day To Affected Area As Needed 14)  Furosemide 20 Mg Tabs (Furosemide) .... Once Daily 15)  Klor-Con M20 20 Meq Cr-Tabs (Potassium Chloride Crys Cr) .... Take 3 Tablet By Mouth Once A Day 16)  Budeprion Xl 300 Mg Xr24h-Tab (Bupropion Hcl) .... Take 1 Tablet By Mouth Once A Day 17)  Tussicaps 10-8 Mg Xr12h-Cap (Hydrocod Polst-Chlorphen Polst) .... One By Mouth Two Times A Day As Needed Cough 18)  Benzonatate 100 Mg Caps (Benzonatate) .... Take One Every 4hours As Needed For Cough 19)  Dexilant 60 Mg Cpdr (Dexlansoprazole) .... Take 1 Capsule By Mouth Once A Day 20)  Tramadol Hcl 50 Mg Tabs (Tramadol Hcl) .[name].. 1-2 By Mouth Q 6 Hours As Needed Severe Headache  Allergies (verified): 1)  ! Methotrexate  Past History:  Past medical, surgical, family and social histories (including risk factors) reviewed, and no changes noted (except as noted below).  Past Medical History: Reviewed history from 08/20/2010 and no changes required. SARCOIDOSIS (ICD-135)   -CT Chest 6/08 improvement in Mediastinal LAN   -11/08 start of remecade Rx per [facility] PULMONARY NODULE (ICD-518.89)    -  CT Chest 6/08 reduction in size of nodules due to Arava and Pred RX     -probable granulomas due to Sarcoidosis HYPERTENSION (ICD-401.9) HYPERLIPIDEMIA (ICD-272.4) GERD (ICD-530.81) Sleep APnea DIABETES MELLITUS, TYPE II (ICD-250.00) ANEMIA-NOS (ICD-285.9)  Fundic Gland Polyp Atypical Chest pain    -10/11 cardiac cath neg for CAD Gout  Past Surgical History: Reviewed history from 05/21/2010 and no changes  required. Ventral Hernia Repair Nissen Fundoplication Cholecystectomy Hysterectomy right knee arthroscopy May 11, 2010  Family History: Reviewed history from 10/20/2009 and no changes required. father-alive MI, DM mother alive and well uncle with SARCOID No FH of Colon Cancer: Family History Emphysema-Mother, Father  Family History Asthma-Asthma Family History COPD -mother  Social History: Reviewed history from 10/20/2009 and no changes required. Married, live with husband Never Smoked Regular exercise-no Alcohol use-no Illicit Drug Use - no  Review of Systems       The patient complains of shortness of breath with activity.  The patient denies shortness of breath at rest, productive cough, non-productive cough, coughing up blood, chest pain, irregular heartbeats, acid heartburn, indigestion, loss of appetite, weight change, abdominal pain, difficulty swallowing, sore throat, tooth/dental problems, headaches, nasal congestion/difficulty breathing through nose, sneezing, itching, ear ache, anxiety, depression, hand/feet swelling, joint stiffness or pain, rash, change in color of mucus, and fever.    Vital Signs:  Patient profile:   50 year old female Menstrual status:  hysterectomy Height:      66 inches Weight:      234 pounds BMI:     37.91 O2 Sat:      99 % on Room air Temp:     98.0 degrees F oral Pulse rate:   72 / minute BP sitting:   140 / 84  (right arm) Cuff size:   large  Vitals Entered By: Gweneth Dimitri RN (October 19, 2010 9:46 AM)  O2 Flow:  Room air CC: Follow up.  Pt states breathing is doing well overall.  Slight nonprod cough x 2 wks.  Denies SOB, wheezing, chest tightness.  Comments Medications reviewed with patient Daytime contact number verified with patient. Gweneth Dimitri RN  October 19, 2010 9:46 AM    Physical Exam  Additional Exam:   GEN: A/Ox3; pleasant , NAD, obese female.  HEENT:  [place]/AT, , EACs-clear, TMs-wnl, NOSE-mild erythema,  purulence seen R>L THROAT-clear NECK:  Supple w/ fair ROM; no JVD; normal carotid impulses w/o bruits; no thyromegaly or nodules palpated; no lymphadenopathy. RESP  Coarse BS w/ no wheeizng, has some mild upper airway psuedowheezing on forced exp.  CARD:  RRR, no m/r/g   [place]co: Warm bil,  no calf tenderness edema, clubbing, pulses intact    Impression & Recommendations:  Problem # 1:  SARCOIDOSIS (ICD-135) Assessment Unchanged stable sarcoidosis plan  cont remecade   Problem # 2:  SLEEP APNEA (ICD-780.57) Assessment: Unchanged no change in osa cont cpap  Medications Added to Medication List This Visit: 1)  Simvastatin 40 Mg Tabs (Simvastatin) .... Take one tablet at bedtime 2)  Dexilant 60 Mg Cpdr (Dexlansoprazole) .... Take 1 capsule by mouth once a day  Other Orders: Est. Patient Level III ([address])  Patient Instructions: 1)  No change in medications 2)  Return in     4     months

## 2010-11-12 NOTE — Procedures (Signed)
Summary: Upper Endoscopy  Patient: [name] Note: All result statuses are Final unless otherwise noted.  Tests: (1) Upper Endoscopy (EGD)   EGD Upper Endoscopy       DONE     [facility]     [address]     [place], [place]  [address]           ENDOSCOPY PROCEDURE REPORT           PATIENT:  [name], [name]  MR#:  [id number]     BIRTHDATE:  07-07-1961, 48 yrs. old  GENDER:  female           ENDOSCOPIST:  Wilhemina Bonito. Eda Keys, MD     Referred by:  Office           PROCEDURE DATE:  11/25/2009     PROCEDURE:  EGD, diagnostic,     Maloney Dilation of Esophagus -     82F     ASA CLASS:  Class III     INDICATIONS:  dysphagia, dilation of esophageal stricture           MEDICATIONS:   Fentanyl 100 mcg IV, Versed 8 mg IV, Benadryl 25 mg     IV     TOPICAL ANESTHETIC:  Exactacain Spray           DESCRIPTION OF PROCEDURE:   After the risks benefits and     alternatives of the procedure were thoroughly explained, informed     consent was obtained.  The Pentax Gastroscope [id number] endoscope     was introduced through the mouth and advanced to the second     portion of the duodenum, without limitations.  The instrument was     slowly withdrawn as the mucosa was fully examined.     <<PROCEDUREIMAGES>>           The esophagus was normal with some resistance at the GE junction.     The stomach was normal s/p fundoplication.  Otherwise the     examination was normal.    Retroflexed views revealed the prior     fundoplication.    The scope was then withdrawn from the patient     and the procedure completed.           COMPLICATIONS:  None           ENDOSCOPIC IMPRESSION:     1) S/p fundoplication     2) Otherwise normal examination     3) S/P Maloney dilation to 82F           RECOMMENDATIONS:     1) Clear liquids until 4 pm, then soft foods rest of day. Resume     prior diet tomorrow.     2) continue current medications     3) Follow up prn        ______________________________     Wilhemina Bonito. Eda Keys, MD           CC:  Lindley Magnus, MD, The Patient           n.     eSIGNEDWilhemina Bonito. Eda Keys at 11/25/2009 02:38 PM           Waldo Laine, [phone]  Note: An exclamation mark (!) indicates a result that was not dispersed into the flowsheet. Document Creation Date: 11/25/2009 2:38 PM _______________________________________________________________________  (1) Order result status: Final Collection or observation date-time: 11/25/2009 14:31 Requested date-time:  Receipt date-time:  Reported date-time:  Referring Physician:   Ordering Physician: Fransico Setters [id number]) Specimen Source:  Source: Launa Grill Order Number: [id number] Lab site:

## 2010-11-12 NOTE — Consult Note (Signed)
Summary: ENT-Dr. Narda Bonds  ENT-Dr. Narda Bonds   Imported By: Maryln Gottron 03/25/2010 14:50:37  _____________________________________________________________________  External Attachment:    Type:   Image     Comment:   External Document

## 2010-11-12 NOTE — Letter (Signed)
Summary: [facility] Results Follow Up Letter  [facility]  [address]   [place], [place] [address]   Phone: [phone]  Fax: [phone]    01/12/2010 MRN: [id number]  [name] [address] [place], [place]  [address]  Dear [name],  We have received the results from your recent tests and have been unable to contact you.  Please call our office at [phone] so that Dr. Vassie Loll or his nurse may review the results with you.    Thank you,  [facility]  Appended Document: [facility] [place] Results Follow Up Letter attempted to contact pt x 2. letter sent in mail  reviewed 2/21- 3/7 >> compliance 10/15 ds, avg use 2 h, pr 7 cm, no residual events  pl ask her to try & increase usage of CPPA to 4 h/ night at least

## 2010-11-12 NOTE — Assessment & Plan Note (Signed)
Summary: bump on arm//ccm   Vital Signs:  Patient profile:   50 year old female Temp:     98.7 degrees F oral Pulse rate:   72 / minute Pulse rhythm:   regular Resp:     16 per minute BP sitting:   128 / 76  (left arm) Cuff size:   large  Vitals Entered By: Gladis Riffle, RN (Feb 18, 2010 9:24 AM) CC: went to urgent care for mouth sores and given chlorhex glu 0.12% solution with no relief--also bump right lower arm x 2 days with total arm pain Is Patient Diabetic? Yes Did you bring your meter with you today? No Comments CBGs 125-130 at home   Primary Care Provider:  Birdie Sons, MD   CC:  went to urgent care for mouth sores and given chlorhex glu 0.12% solution with no relief--also bump right lower arm x 2 days with total arm pain.  History of Present Illness: mouth ulcers for 10 days went to [facility]---no results with meds no associated symptoms pain with eating/drinking  no fever or chills no new contacts  4 day hx of right arm pain (forearm). She notes a "small bump" no trauma no associated sxs  All other systems reviewed and were negative   Preventive Screening-Counseling & Management  Alcohol-Tobacco     Smoking Status: never  Current Problems (verified): 1)  Anemia-unspecified  (ICD-285.9) 2)  Back Pain  (ICD-724.5) 3)  Obesity  (ICD-278.00) 4)  Sleep Apnea  (ICD-780.57) 5)  Menopausal Syndrome  (ICD-627.2) 6)  Sarcoidosis  (ICD-135) 7)  Hypertension  (ICD-401.9) 8)  Hyperlipidemia  (ICD-272.4) 9)  Gerd  (ICD-530.81) 10)  Diabetes Mellitus, Type II  (ICD-250.00)  Current Medications (verified): 1)  Atenolol-Chlorthalidone 50-25 Mg Tabs (Atenolol-Chlorthalidone) .... Take 1 Tablet By Mouth Once A Day 2)  Effexor Xr 75 Mg Cp24 (Venlafaxine Hcl) .... Take 1 Tablet By Mouth Once A Day 3)  Lorazepam 1 Mg Tabs (Lorazepam) .... One Every 6 Hrs As Needed 4)  Potassium Chloride Crys Cr 20 Meq Tbcr (Potassium Chloride Crys Cr) .[name].. 1 Three Times A Day 5)  Zegerid  40-1100 Mg Caps (Omeprazole-Sodium Bicarbonate) .... Take 1 Tablet By Mouth Once A Day 6)  Remicade 100 Mg  Solr (Infliximab) .... Injection Every 6 Weeks 7)  Fluticasone Propionate 50 Mcg/act  Susp (Fluticasone Propionate) .... 2 Sprays Each Nostril Once Daily 8)  Furosemide 20 Mg Tabs (Furosemide) .... Take One (1) By Mouth Daily 9)  Vicodin 5-500 Mg  Tabs (Hydrocodone-Acetaminophen) .... One Tab Every 6 Hours As Needed 10)  Estrace 1 Mg Tabs (Estradiol) .... Take 1 Tablet By Mouth Once A Day 11)  Restasis 0.05 % Emul (Cyclosporine) .[name].. 1 Drop Each Eye Daily 12)  Lotemax 0.5 % Susp (Loteprednol Etabonate) .[name].. 1 Drop Each Eye Every 6 Hours As Needed 13)  Simvastatin 40 Mg Tabs (Simvastatin) .... Take One Tablet At Bedtime Start One Week After Completing Diflucan 14)  Levbid 0.375 Mg Xr12h-Tab (Hyoscyamine Sulfate) .[name].. 1 By Mouth Two Times A Day As Needed 15)  Prednisone 5 Mg Tabs (Prednisone) .... Take 1 Tablet By Mouth Every Other Day 16)  Cpap 7 Cm 17)  Nystatin-Triamcinolone 100000-0.1 Unit/gm-% Crea (Nystatin-Triamcinolone) .... Apply Two Times A Day To Affected Area 18)  Hydromet 5-1.5 Mg/26ml Syrp (Hydrocodone-Homatropine) .[name].. 1 Tsp Three Times A Day As Needed Cough  Allergies: 1)  ! Methotrexate  Physical Exam  General:  Well-developed,well-nourished,in no acute distress; alert,appropriate and cooperative throughout examinationoverweight-appearing.  Head:  normocephalic and atraumatic.   Eyes:  pupils equal and pupils round.   Ears:  R ear normal and L ear normal.   Nose:  no external deformity and no external erythema.   Mouth:  small blisters buccal mucosa of lips Neck:  supple.   Chest Wall:  marked tenderness costochondral joints 2-7 Lungs:  normal respiratory effort and no intercostal retractions.   Heart:  normal rate and regular rhythm.   Abdomen:  soft and non-tender.   Msk:  FROM both arms slight tenderness to palpation right forearm Neurologic:  cranial nerves  II-XII intact and gait normal.   Skin:  turgor normal and color normal.     Impression & Recommendations:  Problem # 1:  UNSPECIFIED VIRAL EXANTHEM (ICD-057.9) could be herpetic valtrex--side effects discussed arm pain suspect will self resolve  Complete Medication List: 1)  Atenolol-chlorthalidone 50-25 Mg Tabs (Atenolol-chlorthalidone) .... Take 1 tablet by mouth once a day 2)  Effexor Xr 75 Mg Cp24 (Venlafaxine hcl) .... Take 1 tablet by mouth once a day 3)  Lorazepam 1 Mg Tabs (Lorazepam) .... One every 6 hrs as needed 4)  Potassium Chloride Crys Cr 20 Meq Tbcr (Potassium chloride crys cr) .[name].. 1 three times a day 5)  Zegerid 40-1100 Mg Caps (Omeprazole-sodium bicarbonate) .... Take 1 tablet by mouth once a day 6)  Remicade 100 Mg Solr (Infliximab) .... Injection every 6 weeks 7)  Fluticasone Propionate 50 Mcg/act Susp (Fluticasone propionate) .... 2 sprays each nostril once daily 8)  Furosemide 20 Mg Tabs (Furosemide) .... Take one (1) by mouth daily 9)  Vicodin 5-500 Mg Tabs (Hydrocodone-acetaminophen) .... One tab every 6 hours as needed 10)  Estrace 1 Mg Tabs (Estradiol) .... Take 1 tablet by mouth once a day 11)  Restasis 0.05 % Emul (Cyclosporine) .[name].. 1 drop each eye daily 12)  Lotemax 0.5 % Susp (Loteprednol etabonate) .[name].. 1 drop each eye every 6 hours as needed 13)  Simvastatin 40 Mg Tabs (Simvastatin) .... Take one tablet at bedtime start one week after completing diflucan 14)  Levbid 0.375 Mg Xr12h-tab (Hyoscyamine sulfate) .[name].. 1 by mouth two times a day as needed 15)  Prednisone 5 Mg Tabs (Prednisone) .... Take 1 tablet by mouth every other day 16)  Cpap 7 Cm  17)  Nystatin-triamcinolone 100000-0.1 Unit/gm-% Crea (Nystatin-triamcinolone) .... Apply two times a day to affected area 18)  Hydromet 5-1.5 Mg/84ml Syrp (Hydrocodone-homatropine) .[name].. 1 tsp three times a day as needed cough 19)  Valtrex 1 Gm Tabs (Valacyclovir hcl) .... Take 1 tablet by mouth three times a  day Prescriptions: VALTREX 1 GM TABS (VALACYCLOVIR HCL) Take 1 tablet by mouth three times a day  #21 x 0   Entered and Authorized by:   Birdie Sons MD   Signed by:   Birdie Sons MD on 02/18/2010   Method used:   [facility]        [name]       [address]       [place], [place]  [address]       Ph: [phone]       Fax: [phone]   RxID:   [id number]

## 2010-11-12 NOTE — Assessment & Plan Note (Signed)
Summary: 1 wk rov/njr/pt rsc/cjr   Vital Signs:  Patient profile:   50 year old female Weight:      278 pounds Temp:     98.0 degrees F oral BP sitting:   124 / 84  (left arm) Cuff size:   large  Vitals Entered By: Kern Reap CMA Duncan Dull) (August 04, 2009 8:19 AM)  Reason for Visit 1 week follow up   Primary Care Provider:  Leeanne Deed   History of Present Illness: Headache---resolved-Reviewed previous notes--note discontinuation of meds previously.   Today she complains of sinus congestion and minimal cough--non productive..no fever, no chills  Sarcoid---reviewed dr [employer] previous note Mood disorder---doing well on current meds  All other systems reviewed and were negative   Preventive Screening-Counseling & Management  Alcohol-Tobacco     Smoking Status: never  Current Problems (verified): 1)  Headache  (ICD-784.0) 2)  Obesity  (ICD-278.00) 3)  Sleep Apnea  (ICD-780.57) 4)  Nonspecific Abnormal Toxicological Findings  (ICD-796.0) 5)  Menopausal Syndrome  (ICD-627.2) 6)  Sarcoidosis  (ICD-135) 7)  Hypertension  (ICD-401.9) 8)  Hyperlipidemia  (ICD-272.4) 9)  Gerd  (ICD-530.81) 10)  Diabetes Mellitus, Type II  (ICD-250.00)  Current Medications (verified): 1)  Atenolol-Chlorthalidone 50-25 Mg Tabs (Atenolol-Chlorthalidone) .... Take 1 Tablet By Mouth Once A Day 2)  Budeprion Xl 300 Mg Tb24 (Bupropion Hcl) .... Take 1/2 Tablet By Mouth Once A Day 3)  Effexor Xr 75 Mg Cp24 (Venlafaxine Hcl) .... Take 1 Tablet By Mouth Once A Day 4)  Lorazepam 1 Mg Tabs (Lorazepam) .... One Every 6 Hrs As Needed 5)  Novolog Flexpen 100 Unit/ml Soln (Insulin Aspart) .... Three Times A Day As Directed 6)  Onetouch Ultra Test  Strp (Glucose Blood) .... Tid 7)  Potassium Chloride Crys Cr 20 Meq Tbcr (Potassium Chloride Crys Cr) .[name].. 1 Three Times A Day 8)  Prednisone 10 Mg Tabs (Prednisone) .[name].. 1  Once Daily 9)  Zegerid 40-1100 Mg Caps (Omeprazole-Sodium Bicarbonate) ....  Take 1 Tablet By Mouth Once A Day 10)  Pen Needles 31g X 8 Mm  Misc (Insulin Pen Needle) .... Use As Directed 11)  Remicade 100 Mg  Solr (Infliximab) .... Injection Every 6 Weeks 12)  Fluticasone Propionate 50 Mcg/act  Susp (Fluticasone Propionate) .... 2 Sprays Each Nostril Once Daily 13)  Furosemide 20 Mg Tabs (Furosemide) .... Take One (1) By Mouth Daily 14)  Vicodin 5-500 Mg  Tabs (Hydrocodone-Acetaminophen) .... One Tab Every 6 Hours As Needed 15)  Estrace 1 Mg Tabs (Estradiol) .... Take 1 Tablet By Mouth Once A Day 16)  Restasis 0.05 % Emul (Cyclosporine) .[name].. 1 Drop Each Eye Daily 17)  Lotemax 0.5 % Susp (Loteprednol Etabonate) .[name].. 1 Drop Each Eye Every 6 Hours As Needed 18)  Utac 500-500 Mg Tabs (Methenamine Mand-Sod Phosphate) .... Two Times A Day  Allergies (verified): 1)  ! Methotrexate  Physical Exam  General:  nad, obese Head:  normocephalic and atraumatic.   Eyes:  pupils equal and pupils round.   Ears:  R ear normal and L ear normal.   Nose:  no external deformity and no external erythema.   Neck:  no masses, thyromegaly, or abnormal cervical nodes Chest Wall:  No deformities, masses, or tenderness noted. Lungs:  Normal respiratory effort, chest expands symmetrically. Lungs are clear to auscultation, no crackles or wheezes. Abdomen:  Bowel sounds positive,abdomen soft and non-tender without masses, organomegaly or hernias noted. unable to ilicit pain  obese Msk:  No deformity  or scoliosis noted of thoracic or lumbar spine.   no joint swelling or erythema Neurologic:  gait normal and finger-to-nose normal.   Skin:  turgor normal and color normal.     Impression & Recommendations:  Problem # 1:  HEADACHE (ICD-784.0) resolved---probably because of disconinuation of decreasing multiple meds--see preious ofice note Her updated medication list for this problem includes:    Atenolol-chlorthalidone 50-25 Mg Tabs (Atenolol-chlorthalidone) .[name]... Take 1 tablet by mouth  once a day    Vicodin 5-500 Mg Tabs (Hydrocodone-acetaminophen) ..... One tab every 6 hours as needed  Problem # 2:  HYPERTENSION (ICD-401.9) controlled Her updated medication list for this problem includes:    Atenolol-chlorthalidone 50-25 Mg Tabs (Atenolol-chlorthalidone) .[name]... Take 1 tablet by mouth once a day    Furosemide 20 Mg Tabs (Furosemide) .[name]... Take one (1) by mouth daily  BP today: 124/84 Prior BP: 118/74 (08/01/2009)  Prior 10 Yr Risk Heart Disease: 8 % (09/21/2006)  Labs Reviewed: K+: 3.7 (06/25/2009) Creat: : 0.7 (06/25/2009)   Chol: 184 (05/08/2009)   HDL: 45.80 (05/08/2009)   LDL: DEL (09/30/2008)   TG: 383.0 (05/08/2009)  Problem # 3:  SARCOIDOSIS (ICD-135) reviewed previous note by dr Delford Field prednsione (10 mg by mouth once daily)  Problem # 4:  DIABETES MELLITUS, TYPE II (ICD-250.00)  Her updated medication list for this problem includes:    Novolog Flexpen 100 Unit/ml Soln (Insulin aspart) .[name]... Three times a day as directed  Labs Reviewed: Creat: 0.7 (06/25/2009)     Last Eye Exam: normal-pt's report (07/11/2008) Reviewed HgBA1c results: 6.5 (05/08/2009)  6.5 (04/21/2009)  Complete Medication List: 1)  Atenolol-chlorthalidone 50-25 Mg Tabs (Atenolol-chlorthalidone) .... Take 1 tablet by mouth once a day 2)  Effexor Xr 75 Mg Cp24 (Venlafaxine hcl) .... Take 1 tablet by mouth once a day 3)  Lorazepam 1 Mg Tabs (Lorazepam) .... One every 6 hrs as needed 4)  Novolog Flexpen 100 Unit/ml Soln (Insulin aspart) .... Three times a day as directed 5)  Onetouch Ultra Test Strp (Glucose blood) .... Tid 6)  Potassium Chloride Crys Cr 20 Meq Tbcr (Potassium chloride crys cr) .[name].. 1 three times a day 7)  Prednisone 10 Mg Tabs (Prednisone) .[name].. 1  once daily 8)  Zegerid 40-1100 Mg Caps (Omeprazole-sodium bicarbonate) .... Take 1 tablet by mouth once a day 9)  Pen Needles 31g X 8 Mm Misc (Insulin pen needle) .... Use as directed 10)  Remicade 100 Mg Solr (Infliximab)  .... Injection every 6 weeks 11)  Fluticasone Propionate 50 Mcg/act Susp (Fluticasone propionate) .... 2 sprays each nostril once daily 12)  Furosemide 20 Mg Tabs (Furosemide) .... Take one (1) by mouth daily 13)  Vicodin 5-500 Mg Tabs (Hydrocodone-acetaminophen) .... One tab every 6 hours as needed 14)  Estrace 1 Mg Tabs (Estradiol) .... Take 1 tablet by mouth once a day 15)  Restasis 0.05 % Emul (Cyclosporine) .[name].. 1 drop each eye daily 16)  Lotemax 0.5 % Susp (Loteprednol etabonate) .[name].. 1 drop each eye every 6 hours as needed 17)  Utac 500-500 Mg Tabs (Methenamine mand-sod phosphate) .... Two times a day  Patient Instructions: 1)  Please schedule a follow-up appointment in 1 month. 2)  labs one week prior to visit 3)  lipids---272.4 4)  lfts-995.2 5)  bmet-995.2 6)  A1C-250.02 7)

## 2010-11-12 NOTE — Procedures (Signed)
Summary: Colonoscopy  Patient: Ardell Aaronson Note: All result statuses are Final unless otherwise noted.  Tests: (1) Colonoscopy (COL)   COL Colonoscopy           DONE     [facility]     [address]     [place], [place]  [address]           COLONOSCOPY PROCEDURE REPORT           PATIENT:  [name], [name]  MR#:  [id number]     BIRTHDATE:  May 19, 1961, 48 yrs. old  GENDER:  female     ENDOSCOPIST:  Wilhemina Bonito. Eda Keys, MD     REF. BY:  Screening Recall     PROCEDURE DATE:  04/29/2010     PROCEDURE:  Average-risk screening colonoscopy     G0121     ASA CLASS:  Class III     INDICATIONS:  Routine Risk Screening     MEDICATIONS:   Fentanyl 100 mcg IV, Versed 10 mg IV, Benadryl 25     mg IV           DESCRIPTION OF PROCEDURE:   After the risks benefits and     alternatives of the procedure were thoroughly explained, informed     consent was obtained.  Digital rectal exam was performed and     revealed no abnormalities.   The LB [id number] endoscope     was introduced through the anus and advanced to the cecum, which     was identified by both the appendix and ileocecal valve, without     limitations.Time to cecum = 2:53 min. The quality of the prep was     excellent, using MoviPrep.  The instrument was then slowly     withdrawn (time = 12:41 min) as the colon was fully examined.     <<PROCEDUREIMAGES>>           FINDINGS:  A normal appearing cecum, ileocecal valve, and     appendiceal orifice were identified. The ascending, hepatic     flexure, transverse, splenic flexure, descending, sigmoid colon,     and rectum appeared unremarkable.  Mild Melanosis coli was     present.   Retroflexed views in the rectum revealed internal     hemorrhoids.    The scope was then withdrawn from the patient and     the procedure completed.           COMPLICATIONS:  None     ENDOSCOPIC IMPRESSION:     1) Normal colon     2) Mild Melanosis coli     3) Small Internal  hemorrhoids     4) NO POLYPS OR CANCERS           RECOMMENDATIONS:     1) Continue current colorectal screening recommendations for     "routine risk" patients with a repeat colonoscopy in 10 years.           ______________________________     Wilhemina Bonito. Eda Keys, MD           CC:  Stephanny Hedges. Plotnikov, MD;The Patient           n.     eSIGNED:   John N. Eda Keys at 04/29/2010 02:10 PM           Waldo Laine, [phone]  Note: An exclamation mark (!) indicates a result that was not dispersed into the flowsheet. Document Creation Date: 04/29/2010  2:10 PM _______________________________________________________________________  (1) Order result status: Final Collection or observation date-time: 04/29/2010 13:59 Requested date-time:  Receipt date-time:  Reported date-time:  Referring Physician:   Ordering Physician: Fransico Setters [phone]) Specimen Source:  Source: Launa Grill Order Number: [id number] Lab site:   Appended Document: Colonoscopy    Clinical Lists Changes  Observations: Added new observation of COLONNXTDUE: 04/2020 (04/29/2010 16:15)

## 2010-11-12 NOTE — Progress Notes (Signed)
Summary: LMTCB questions about med       21  22  23  24  25   Phone Note Call from Patient Call back at [phone]   Caller: Spouse Call For: wright Summary of Call: have questions about prednisone prescript Patient's chart has been requested.  Initial call taken by: Rickard Patience,  October 09, 2007 1:11 PM  Follow-up for Phone Call        LM with family member TCB. ..................................................................Marland KitchenCloyde Reams RN  October 09, 2007 1:17 PM  Spoke with pt's husband confirmed pt is supposed to be taking prednisone 15mg  once daily. Follow-up by: [name] RN,  October 09, 2007 2:08 PM

## 2010-11-12 NOTE — Letter (Signed)
Summary: [facility]  [facility]   Imported By: Maryln Gottron 03/26/2009 09:36:12  _____________________________________________________________________  External Attachment:    Type:   Image     Comment:   External Document

## 2010-11-12 NOTE — Progress Notes (Signed)
Summary: rx verification   Phone Note From Pharmacy Call back at [phone]   Caller: [facility] Call For: swords  Summary of Call: wants to verify that the potassium is a qty of 90 with 3 refills  Initial call taken by: Roselle Locus,  January 23, 2008 1:44 PM  Follow-up for Phone Call        clarified yes with pharmacist Follow-up by: Gladis Riffle, RN,  January 23, 2008 5:29 PM

## 2010-11-12 NOTE — Assessment & Plan Note (Signed)
Summary: severe headaches/cjr   Vital Signs:  Patient profile:   49 year old female Temp:     98.1 degrees F Pulse rate:   72 / minute Resp:     22 per minute BP sitting:   124 / 82  (left arm) Cuff size:   large  Vitals Entered By: Gladis Riffle, RN (July 14, 2009 12:17 PM)  Primary Care Provider:  Leeanne Deed   History of Present Illness: Headache for 2-3 weeks has been to [facility] practice--told she had sinus infection---initially treated with ceftin---no relief---levaquin started yesterday. no neurologic sxs reviewed sinus film interpretation and CBC results  All other systems reviewed and were negative   Allergies: 1)  ! Methotrexate  Comments:  Nurse/Medical Assistant: c/o headache x 1-2 weeks, went to urgent care yesterday and given nystatin mouth rinse for thrush, also given fluconazole and levaquin--sinus films done, lab work done  The patient's medications and allergies were reviewed with the patient and were updated in the Medication and Allergy Lists. Gladis Riffle, RN (July 14, 2009 12:24 PM)  Review of Systems       All other systems reviewed and were negative   Physical Exam  General:  nad, obese Head:  normocephalic and atraumatic.   Eyes:  pupils equal and pupils round.   no papilledema Neck:  no masses, thyromegaly, or abnormal cervical nodes Lungs:  Normal respiratory effort, chest expands symmetrically. Lungs are clear to auscultation, no crackles or wheezes. Heart:  normal rate and no murmur.     Impression & Recommendations:  Problem # 1:  HEADACHE (ICD-784.0)  currently on levaquin--doubt her sxs are related to sinus infection---stop levaquin check labs check CT head Her updated medication list for this problem includes:    Atenolol-chlorthalidone 50-25 Mg Tabs (Atenolol-chlorthalidone) .[name]... Take 1 tablet by mouth once a day    Vicodin 5-500 Mg Tabs (Hydrocodone-acetaminophen) ..... One tab every 6 hours as needed  Vicodin 5-500 Mg Tabs (Hydrocodone-acetaminophen) .[name]... Take 1 tablet by mouth four times a day as needed headache  Orders: Sedimentation Rate, non-automated (26948) Venipuncture (54627) Radiology Referral (Radiology)  Complete Medication List: 1)  Atenolol-chlorthalidone 50-25 Mg Tabs (Atenolol-chlorthalidone) .... Take 1 tablet by mouth once a day 2)  Budeprion Xl 300 Mg Tb24 (Bupropion hcl) .... Take 1 tablet by mouth once a day 3)  Effexor Xr 75 Mg Cp24 (Venlafaxine hcl) .... Take 1 capsule by mouth two times a day 4)  Lorazepam 1 Mg Tabs (Lorazepam) .... One every 6 hrs as needed 5)  Novolog Flexpen 100 Unit/ml Soln (Insulin aspart) .... Three times a day as directed 6)  Onetouch Ultra Test Strp (Glucose blood) .... Tid 7)  Potassium Chloride Crys Cr 20 Meq Tbcr (Potassium chloride crys cr) .[name].. 1 three times a day 8)  Prednisone 10 Mg Tabs (Prednisone) .[name].. 1 in am   1/2 in pm 9)  Zegerid 40-1100 Mg Caps (Omeprazole-sodium bicarbonate) .... One twice daily 10)  Crestor 20 Mg Tabs (Rosuvastatin calcium) .[name].. 1 by mouth once daily 11)  Pen Needles 31g X 8 Mm Misc (Insulin pen needle) .... Use as directed 12)  Remicade 100 Mg Solr (Infliximab) .... Injection every 6 weeks 13)  Fluticasone Propionate 50 Mcg/act Susp (Fluticasone propionate) .... 2 sprays each nostril once daily 14)  Furosemide 20 Mg Tabs (Furosemide) .... Take one (1) by mouth daily 15)  Vicodin 5-500 Mg Tabs (Hydrocodone-acetaminophen) .... One tab every 6 hours as needed 16)  Vitamin D  1000 Unit Caps (Cholecalciferol) .... Two times a day 17)  Estrace 1 Mg Tabs (Estradiol) .... Take 1 tablet by mouth once a day 18)  Slow Release Iron 47.5 Mg Cr-tabs (Ferrous sulfate) .... Once daily 19)  Multiple Vitamin Tabs (Multiple vitamin) .... Once daily 20)  Tussionex Pennkinetic Er 8-10 Mg/56ml Lqcr (Chlorpheniramine-hydrocodone) .... Take 1 teaspoon every 12 hrs as needed cough 21)  Restasis 0.05 % Emul (Cyclosporine) .[name].. 1  drop each eye daily 22)  Lotemax 0.5 % Susp (Loteprednol etabonate) .[name].. 1 drop each eye every 6 hours as needed 23)  Utac 500-500 Mg Tabs (Methenamine mand-sod phosphate) .... Two times a day 24)  Zantac 150 Mg Tabs (Ranitidine hcl) .... One daily 25)  Tessalon Perles 100 Mg Caps (Benzonatate) .... One to two by mouth 3-4 times daily 26)  Nystatin 100000 Unit/ml Susp (Nystatin) .... Swish and swallow 5ml qid 27)  Vicodin 5-500 Mg Tabs (Hydrocodone-acetaminophen) .... Take 1 tablet by mouth four times a day as needed headache Prescriptions: VICODIN 5-500 MG TABS (HYDROCODONE-ACETAMINOPHEN) Take 1 tablet by mouth four times a day as needed headache  #10 x 2   Entered by:   Gladis Riffle, RN   Authorized by:   Birdie Sons MD   Signed by:   Gladis Riffle, RN on 07/15/2009   Method used:   Telephoned to ...       [facility] (retail)       [address]       [place], [place]  [address]       Ph: [phone]       Fax: [phone]   RxID:   [id number] NOVOLOG FLEXPEN 100 UNIT/ML SOLN (INSULIN ASPART) three times a day as directed  #5 boxes x 11   Entered and Authorized by:   Birdie Sons MD   Signed by:   Birdie Sons MD on 07/14/2009   Method used:   Electronically to        [facility] (retail)       [address]       [place], [place]  [address]       Ph: [phone]       Fax: [phone]   RxID:   [id number]   Laboratory Results   Blood Tests     SED rate: 20 mm/hr  Comments: Rita Ohara  July 14, 2009 1:31 PM

## 2010-11-12 NOTE — Assessment & Plan Note (Signed)
Summary: cp/lmr   Copy to:  [facility] Primary Provider/Referring Provider:  Dr. Birdie Sons  CC:  was dc'd from hosp yesterday and having increased SOB and chest discomfort with inhalation onset 11pm last night.  History of Present Illness:  50 year old, white female with chronic pulmonary sarcoidosis.  This is been manifested by cyclic cough and mediastinal adenopathy with associated chest pain.  There are also stable pulmonary nodules.  The patient has been intolerant of systemic corticosteroids.  She also had adverse side effects to Arava(leflunamide).    On this basis we referred the patient in November 2008  to Dr. Azzie Roup,  for Remicade injections.  There has been a positive response to Rx.  December 18, 2008-now on pred 5/d  on remecade every 6weeks The pts cough is better.  There is some chest pain.  Pt notes some sinus pressure.  Some postnasal drip.  May 01, 2009 --Presents for an acute office visit. Admitted 04/27/2009- 04/30/2009 for  Sarcoidosis with acute exacerbation.   She was admitted w/ increased cough, dyspnea and weakness. CT chestt was     negative for PE and showed no intrathoracic abnormalities.   Sputum cx was neg. She was treated w/ steorids, aggressive pulmonary toilet. She did have a lactic acidosis.  w/ lactic acid level as high as 8, last check showed at 2.6. Felt to be from r hyperventilation at the time of admission. She did improve but cough and tightness did not totally resolve. Today she says she has been having dry cough. Discharge summary says pred 10mg  however she is on 5mg  once daily. Dyspnea is worse in heat (temps >90 outside). Denies orthopnea, hemoptysis, fever, n/v/d, edema.  Pain on inspiration and with cough.   April 08, 2009 11:44 AM Noting more pain in chest and arms.  Notes sl cough.  Difficult time with heat .  No mucous. Has dry cough.  Remecade has helped. Noting more low back pain with negative urologic work up per PCP. Pt denies any  significant sore throat, nasal congestion or excess secretions, fever, chills, sweats, unintended weight loss, pleurtic or exertional chest pain, orthopnea PND, or leg swelling   Medications Prior to Update: 1)  Proair Hfa 108 (90 Base) Mcg/act  Aers (Albuterol Sulfate) .... 2 Puffs Three Times A Day As Needed 2)  Atenolol-Chlorthalidone 50-25 Mg Tabs (Atenolol-Chlorthalidone) .... Take 1 Tablet By Mouth Once A Day 3)  Budeprion Xl 300 Mg Tb24 (Bupropion Hcl) .... Take 1 Tablet By Mouth Once A Day 4)  Effexor Xr 75 Mg Cp24 (Venlafaxine Hcl) .... Take 1 Capsule By Mouth Two Times A Day 5)  Lorazepam 1 Mg Tabs (Lorazepam) .... Prn 6)  Novolog Flexpen 100 Unit/ml Soln (Insulin Aspart) .... Three Times A Day As Directed 7)  Onetouch Ultra Test  Strp (Glucose Blood) .... Tid 8)  Potassium Chloride Crys Cr 20 Meq Tbcr (Potassium Chloride Crys Cr) .[name].. 1 Three Times A Day 9)  Prednisone 5 Mg Tabs (Prednisone) .... Take 1 Tablet By Mouth Once A Day 10)  Zegerid 40-1100 Mg Caps (Omeprazole-Sodium Bicarbonate) .... One Twice Daily 11)  Crestor 20 Mg Tabs (Rosuvastatin Calcium) .[name].. 1 By Mouth Once Daily 12)  Qvar 80 Mcg/act  Aers (Beclomethasone Dipropionate) .... Two Puffs Twice Daily 13)  Pen Needles 31g X 8 Mm  Misc (Insulin Pen Needle) .... Use As Directed 14)  Remicade 100 Mg  Solr (Infliximab) .... Injection Every 6 Weeks 15)  Fluticasone Propionate 50 Mcg/act  Susp (  Fluticasone Propionate) .... 2 Sprays Each Nostril Once Daily 16)  Furosemide 20 Mg Tabs (Furosemide) .... Take One (1) By Mouth Daily 17)  Vicodin 5-500 Mg  Tabs (Hydrocodone-Acetaminophen) .... One Tab Every 6 Hours As Needed 18)  Vitamin D 1000 Unit  Caps (Cholecalciferol) .... Two Times A Day 19)  Benicar 40 Mg  Tabs (Olmesartan Medoxomil) .[name].. 1 By Mouth Once Daily 20)  Estrace 1 Mg Tabs (Estradiol) .... Take 1 Tablet By Mouth Once A Day 21)  Slow Release Iron 47.5 Mg Cr-Tabs (Ferrous Sulfate) .... Once Daily 22)  Multiple  Vitamin  Tabs (Multiple Vitamin) .... Once Daily 23)  Tussionex Pennkinetic Er 8-10 Mg/34ml Lqcr (Chlorpheniramine-Hydrocodone) .... Take 1 Teaspoon Every 12 Hrs As Needed Cough 24)  Restasis 0.05 % Emul (Cyclosporine) .[name].. 1 Drop Each Eye Daily 25)  Fish Oil 500 Mg Caps (Omega-3 Fatty Acids) .... Once Daily 26)  Lotemax 0.5 % Susp (Loteprednol Etabonate) .[name].. 1 Drop Each Eye Every 6 Hours As Needed 27)  Utac 500-500 Mg Tabs (Methenamine Mand-Sod Phosphate) .... Two Times A Day  Current Medications (verified): 1)  Proair Hfa 108 (90 Base) Mcg/act  Aers (Albuterol Sulfate) .... 2 Puffs Three Times A Day As Needed 2)  Atenolol-Chlorthalidone 50-25 Mg Tabs (Atenolol-Chlorthalidone) .... Take 1 Tablet By Mouth Once A Day 3)  Budeprion Xl 300 Mg Tb24 (Bupropion Hcl) .... Take 1 Tablet By Mouth Once A Day 4)  Effexor Xr 75 Mg Cp24 (Venlafaxine Hcl) .... Take 1 Capsule By Mouth Two Times A Day 5)  Lorazepam 1 Mg Tabs (Lorazepam) .... Prn 6)  Novolog Flexpen 100 Unit/ml Soln (Insulin Aspart) .... Three Times A Day As Directed 7)  Onetouch Ultra Test  Strp (Glucose Blood) .... Tid 8)  Potassium Chloride Crys Cr 20 Meq Tbcr (Potassium Chloride Crys Cr) .[name].. 1 Three Times A Day 9)  Prednisone 5 Mg Tabs (Prednisone) .... Take 1 Tablet By Mouth Once A Day 10)  Zegerid 40-1100 Mg Caps (Omeprazole-Sodium Bicarbonate) .... One Twice Daily 11)  Crestor 20 Mg Tabs (Rosuvastatin Calcium) .[name].. 1 By Mouth Once Daily 12)  Qvar 80 Mcg/act  Aers (Beclomethasone Dipropionate) .... Two Puffs Twice Daily 13)  Pen Needles 31g X 8 Mm  Misc (Insulin Pen Needle) .... Use As Directed 14)  Remicade 100 Mg  Solr (Infliximab) .... Injection Every 6 Weeks 15)  Fluticasone Propionate 50 Mcg/act  Susp (Fluticasone Propionate) .... 2 Sprays Each Nostril Once Daily 16)  Furosemide 20 Mg Tabs (Furosemide) .... Take One (1) By Mouth Daily 17)  Vicodin 5-500 Mg  Tabs (Hydrocodone-Acetaminophen) .... One Tab Every 6 Hours As Needed 18)   Vitamin D 1000 Unit  Caps (Cholecalciferol) .... Two Times A Day 19)  Benicar 40 Mg  Tabs (Olmesartan Medoxomil) .[name].. 1 By Mouth Once Daily 20)  Estrace 1 Mg Tabs (Estradiol) .... Take 1 Tablet By Mouth Once A Day 21)  Slow Release Iron 47.5 Mg Cr-Tabs (Ferrous Sulfate) .... Once Daily 22)  Multiple Vitamin  Tabs (Multiple Vitamin) .... Once Daily 23)  Tussionex Pennkinetic Er 8-10 Mg/6ml Lqcr (Chlorpheniramine-Hydrocodone) .... Take 1 Teaspoon Every 12 Hrs As Needed Cough 24)  Restasis 0.05 % Emul (Cyclosporine) .[name].. 1 Drop Each Eye Daily 25)  Fish Oil 500 Mg Caps (Omega-3 Fatty Acids) .... Once Daily 26)  Lotemax 0.5 % Susp (Loteprednol Etabonate) .[name].. 1 Drop Each Eye Every 6 Hours As Needed 27)  Utac 500-500 Mg Tabs (Methenamine Mand-Sod Phosphate) .... Two Times A Day  Allergies: 1)  ! Methotrexate  Past History:  Past Medical History: Last updated: 04/21/2009 EDEMA (ICD-782.3) HEADACHE (ICD-784.0) MENOPAUSAL SYNDROME (ICD-627.2) UTI (ICD-599.0) SARCOIDOSIS (ICD-135)   -CT Chest 6/08 improvement in Mediastinal LAN   -11/08 start of remecade Rx per [facility] PULMONARY NODULE (ICD-518.89)    -CT Chest 6/08 reduction in size of nodules due to Arava and Pred RX     -probable granulomas due to Sarcoidosis HYPERTENSION (ICD-401.9) HYPERLIPIDEMIA (ICD-272.4) GERD (ICD-530.81) Sleep APnea DIABETES MELLITUS, TYPE II (ICD-250.00) ANEMIA-NOS (ICD-285.9)    Past Surgical History: Last updated: 08/21/2006 Ventral Hernia Repair Nissen Fundoplication Cholecystectomy Hysterectomy  Family History: Last updated: 09/15/2007 father-alive MI, DM mother alive and well uncle with SARCOID  Social History: Last updated: 05/31/2007 Married Never Smoked Regular exercise-no Alcohol use-no  Risk Factors: Exercise: no (05/31/2007)  Risk Factors: Smoking Status: never (04/08/2009)  Review of Systems      See HPI  Vital Signs:  Patient profile:   50 year old  female Height:      66 inches Weight:      278 pounds BMI:     45.03 O2 Sat:      98 % on Room air Temp:     98.4 degrees F oral Pulse rate:   72 / minute BP sitting:   128 / 82  (left arm) Cuff size:   large  Vitals Entered By: Boone Master CNA (May 01, 2009 4:13 PM)  O2 Flow:  Room air CC: was dc'd from hosp yesterday, having increased SOB and chest discomfort with inhalation onset 11pm last night Is Patient Diabetic? Yes  Comments Medications reviewed with patient Daytime phone number verified with patient. Boone Master CNA  May 01, 2009 4:18 PM    Physical Exam  Additional Exam:   GEN: A/Ox3; pleasant , NAD, obese female.  HEENT:  [place]/AT, , EACs-clear, TMs-wnl, NOSE-clear, THROAT-clear NECK:  Supple w/ fair ROM; no JVD; normal carotid impulses w/o bruits; no thyromegaly or nodules palpated; no lymphadenopathy. RESP  Coarse BS w/ no wheeizng, has some mild upper airway psuedowheezing on forced exp.  CARD:  RRR, no m/r/g   GI:   Soft & nt; nml bowel sounds; no organomegaly or masses detected. [place]: Warm bil,  no calf tenderness edema, clubbing, pulses intact Neuro: intact w/ no focal deficits.    Impression & Recommendations:  Problem # 1:  SARCOIDOSIS (ICD-135)  Exacerbation , slow to resolve w/ recent hospitalization REC: steroid challenge w/ slow taper Stop fish oil.  Change QVAR to Symbicort 160.4.79mcg 2 puffs two times a day (brush, rinse and gargle after use) Increase Prednisone 20mg  once daily for 1 week, then hold at 10mg  once daily until next office viist.  Mucinex DM two times a day as needed cough/congestion Please contact office for sooner follow up if symptoms do not improve or worsen  follow up next week Dr. Delford Field as scheduled.   Orders: Est. Patient Level IV ([address])  Medications Added to Medication List This Visit: 1)  Prednisone 5 Mg Tabs (Prednisone) .... Take 1 tablet by mouth once a day and as directed.  Complete Medication List: 1)   Proair Hfa 108 (90 Base) Mcg/act Aers (Albuterol sulfate) .... 2 puffs three times a day as needed 2)  Atenolol-chlorthalidone 50-25 Mg Tabs (Atenolol-chlorthalidone) .... Take 1 tablet by mouth once a day 3)  Budeprion Xl 300 Mg Tb24 (Bupropion hcl) .... Take 1 tablet by mouth once a day 4)  Effexor Xr 75 Mg  Cp24 (Venlafaxine hcl) .... Take 1 capsule by mouth two times a day 5)  Lorazepam 1 Mg Tabs (Lorazepam) .... Prn 6)  Novolog Flexpen 100 Unit/ml Soln (Insulin aspart) .... Three times a day as directed 7)  Onetouch Ultra Test Strp (Glucose blood) .... Tid 8)  Potassium Chloride Crys Cr 20 Meq Tbcr (Potassium chloride crys cr) .[name].. 1 three times a day 9)  Prednisone 5 Mg Tabs (Prednisone) .... Take 1 tablet by mouth once a day and as directed. 10)  Zegerid 40-1100 Mg Caps (Omeprazole-sodium bicarbonate) .... One twice daily 11)  Crestor 20 Mg Tabs (Rosuvastatin calcium) .[name].. 1 by mouth once daily 12)  Qvar 80 Mcg/act Aers (Beclomethasone dipropionate) .... Two puffs twice daily 13)  Pen Needles 31g X 8 Mm Misc (Insulin pen needle) .... Use as directed 14)  Remicade 100 Mg Solr (Infliximab) .... Injection every 6 weeks 15)  Fluticasone Propionate 50 Mcg/act Susp (Fluticasone propionate) .... 2 sprays each nostril once daily 16)  Furosemide 20 Mg Tabs (Furosemide) .... Take one (1) by mouth daily 17)  Vicodin 5-500 Mg Tabs (Hydrocodone-acetaminophen) .... One tab every 6 hours as needed 18)  Vitamin D 1000 Unit Caps (Cholecalciferol) .... Two times a day 19)  Benicar 40 Mg Tabs (Olmesartan medoxomil) .[name].. 1 by mouth once daily 20)  Estrace 1 Mg Tabs (Estradiol) .... Take 1 tablet by mouth once a day 21)  Slow Release Iron 47.5 Mg Cr-tabs (Ferrous sulfate) .... Once daily 22)  Multiple Vitamin Tabs (Multiple vitamin) .... Once daily 23)  Tussionex Pennkinetic Er 8-10 Mg/33ml Lqcr (Chlorpheniramine-hydrocodone) .... Take 1 teaspoon every 12 hrs as needed cough 24)  Restasis 0.05 % Emul  (Cyclosporine) .[name].. 1 drop each eye daily 25)  Fish Oil 500 Mg Caps (Omega-3 fatty acids) .... Once daily 26)  Lotemax 0.5 % Susp (Loteprednol etabonate) .[name].. 1 drop each eye every 6 hours as needed 27)  Utac 500-500 Mg Tabs (Methenamine mand-sod phosphate) .... Two times a day  Patient Instructions: 1)  Stop fish oil.  2)  Change QVAR to Symbicort 160.4.37mcg 2 puffs two times a day (brush, rinse and gargle after use) 3)  Increase Prednisone 20mg  once daily for 1 week, then hold at 10mg  once daily until next office viist.  4)  Mucinex DM two times a day as needed cough/congestion 5)  Please contact office for sooner follow up if symptoms do not improve or worsen  6)  follow up next week Dr. Delford Field as scheduled.  Prescriptions: PREDNISONE 5 MG TABS (PREDNISONE) Take 1 tablet by mouth once a day and as directed.  #60 x 5   Entered and Authorized by:   Rubye Oaks NP   Signed by:   Rubye Oaks NP on 05/01/2009   Method used:   Electronically to        [facility] (retail)       [address]       [place], [place]  [address]       Ph: [phone]       Fax: [phone]   RxID:   [id number]

## 2010-11-24 ENCOUNTER — Ambulatory Visit: Payer: Self-pay | Admitting: Internal Medicine

## 2010-12-08 ENCOUNTER — Encounter: Payer: Self-pay | Admitting: Internal Medicine

## 2010-12-08 ENCOUNTER — Ambulatory Visit (INDEPENDENT_AMBULATORY_CARE_PROVIDER_SITE_OTHER): Payer: BC Managed Care – PPO | Admitting: Internal Medicine

## 2010-12-08 DIAGNOSIS — R079 Chest pain, unspecified: Secondary | ICD-10-CM

## 2010-12-08 DIAGNOSIS — K219 Gastro-esophageal reflux disease without esophagitis: Secondary | ICD-10-CM

## 2010-12-08 LAB — SEDIMENTATION RATE: Sed Rate: 20 mm/hr (ref 0–22)

## 2010-12-08 MED ORDER — HYDROCODONE-ACETAMINOPHEN 5-325 MG PO TABS: 2.0000 | ORAL_TABLET | Freq: Four times a day (QID) | ORAL | Status: AC | PRN

## 2010-12-09 LAB — CK TOTAL AND CKMB (NOT AT ARMC)
CK, MB: 1.5 ng/mL (ref 0.3–4.0)
Total CK: 49 U/L (ref 7–177)

## 2010-12-09 LAB — D-DIMER, QUANTITATIVE: D-Dimer, Quant: 0.22 ug/mL-FEU (ref 0.00–0.48)

## 2010-12-10 ENCOUNTER — Other Ambulatory Visit: Payer: Self-pay | Admitting: *Deleted

## 2010-12-10 DIAGNOSIS — J40 Bronchitis, not specified as acute or chronic: Secondary | ICD-10-CM

## 2010-12-10 MED ORDER — PREDNISONE 10 MG PO TABS: 10.0000 mg | ORAL_TABLET | Freq: Every day | ORAL | Status: AC

## 2010-12-10 MED ORDER — PREDNISONE 10 MG PO TABS: 10.0000 mg | ORAL_TABLET | Freq: Every day | ORAL | Status: DC

## 2010-12-10 NOTE — Telephone Encounter (Signed)
Pt's husband is calling to let Dr. Cato Mulligan know that pt is still having chest and back pain.  Meds are not helping, and she would like lab results.

## 2010-12-10 NOTE — Telephone Encounter (Signed)
Trial prednisone 10 mg by mouth daily for 10 days.

## 2010-12-13 NOTE — Progress Notes (Signed)
  Subjective:    Patient ID: [name], female    DOB: 08-29-1961, 50 y.o.   MRN: [id number]  HPI   the patient comes in evaluation for chest pain. Patient comes in for evaluation of chest pain. She is a long history of intermittent chest pain. This time it seems to be associated with some epigastric discomfort and left shoulder discomfort. I have reviewed her chart and outside information. Has had an evaluation for coronary disease including angiography. See that report.  Patient has had 3 days of constant chest discomfort. Some epigastric discomfort. There is no exacerbating or alleviating factors. Pain can be intense at times up to an 8/10. She has no associated symptoms such as shortness of breath, PND, orthopnea. She and her husband deny that the discomfort has awakened her from sleep.   Past Medical History  Diagnosis Date  . Sarcoidosis 04/10/2007  . DIABETES MELLITUS, TYPE II 08/21/2006  . HYPERLIPIDEMIA 08/21/2006  . GOUT 08/20/2010  . OBESITY 06/04/2009  . ANEMIA-UNSPECIFIED 09/18/2009  . HYPERTENSION 08/21/2006  . GERD 08/21/2006  . SLEEP APNEA 04/21/2009   Past Surgical History  Procedure Date  . Ventral hernia repair   . Nissen fundoplication   . Cholecystectomy   . Abdominal hysterectomy   . Knee arthroscopy     right    reports that she has never smoked. She does not have any smokeless tobacco history on file. She reports that she does not drink alcohol or use illicit drugs. family history includes Asthma in her mother; COPD in her mother; Diabetes in her father; Emphysema in her mother; Heart attack in her father; and Sarcoidosis in her maternal uncle.    Allergies  Allergen Reactions  . Methotrexate     REACTION: peri-oral and buccal lesions.     Review of Systems  patient denies chest pain, shortness of breath, orthopnea. Denies lower extremity edema, abdominal pain, change in appetite, change in bowel movements. Patient denies rashes, musculoskeletal  complaints. No other specific complaints in a complete review of systems.      Objective:   Physical Exam  Well-developed well-nourished female in no acute distress. HEENT exam atraumatic, normocephalic, extraocular muscles are intact. Neck is supple. No jugular venous distention no thyromegaly. Chest clear to auscultation without increased work of breathing. Cardiac exam S1 and S2 are regular. Abdominal exam active bowel sounds, soft, nontender. Extremities no edema. Neurologic exam she is alert without any motor sensory deficits. Gait is normal.      Assessment & Plan:

## 2010-12-13 NOTE — Assessment & Plan Note (Signed)
I suspect gastroesophageal reflux disease as the cause of most of her chest discomfort. It is worth noting that she has a history of sarcoid. That has been well controlled. I am not concerned that she has coronary artery disease. It's reasonable to check some lab work. If that is unremarkable then I do not think any further evaluation necessary. I will give her some pain medications temporarily.

## 2010-12-14 NOTE — Progress Notes (Signed)
Pt aware.

## 2010-12-15 ENCOUNTER — Ambulatory Visit (INDEPENDENT_AMBULATORY_CARE_PROVIDER_SITE_OTHER): Payer: BC Managed Care – PPO | Admitting: Internal Medicine

## 2010-12-15 ENCOUNTER — Encounter: Payer: Self-pay | Admitting: Internal Medicine

## 2010-12-15 DIAGNOSIS — M79673 Pain in unspecified foot: Secondary | ICD-10-CM

## 2010-12-15 DIAGNOSIS — M79609 Pain in unspecified limb: Secondary | ICD-10-CM

## 2010-12-15 MED ORDER — PREDNISONE 10 MG PO TABS: ORAL_TABLET | ORAL | Status: AC

## 2010-12-21 ENCOUNTER — Encounter: Payer: Self-pay | Admitting: Internal Medicine

## 2010-12-21 NOTE — Progress Notes (Signed)
  Subjective:    Patient ID: [name], female    DOB: 1961-04-05, 50 y.o.   MRN: [id number]  HPI Pt presents to clinic for evaluation of foot pain. Notes several day h/o left foot pain without injury/trauma. Denies erythema,warmth or swelling. No difficulty with wt bearing. No alleviating or exacerbating factors. Has h/o gout but does not feel like gout attack. Currently on prednisone chronically with daily dose 10mg . No other complaints.   Reviewed pmh, medications and allergies   Review of Systems  Constitutional: Negative for fever and chills.  Musculoskeletal: Positive for arthralgias. Negative for myalgias and joint swelling.  Skin: Negative for color change and rash.       Objective:   Physical Exam  Nursing note and vitals reviewed. Constitutional: She appears well-developed and well-nourished. No distress.  HENT:  Head: Normocephalic and atraumatic.  Right Ear: External ear normal.  Left Ear: External ear normal.  Musculoskeletal:       Right foot: She exhibits tenderness. She exhibits normal range of motion, no bony tenderness, no swelling, no crepitus, no deformity and no laceration.  Skin: Skin is warm and dry. No rash noted. She is not diaphoretic. No erythema.          Assessment & Plan:

## 2010-12-23 LAB — POCT I-STAT, CHEM 8
BUN: 13 mg/dL (ref 6–23)
Calcium, Ion: 1.05 mmol/L — ABNORMAL LOW (ref 1.12–1.32)
Chloride: 98 mEq/L (ref 96–112)
Creatinine, Ser: 0.6 mg/dL (ref 0.4–1.2)
Glucose, Bld: 211 mg/dL — ABNORMAL HIGH (ref 70–99)
HCT: 37 % (ref 36.0–46.0)
Hemoglobin: 12.6 g/dL (ref 12.0–15.0)
Potassium: 2.9 mEq/L — ABNORMAL LOW (ref 3.5–5.1)
Sodium: 138 mEq/L (ref 135–145)
TCO2: 28 mmol/L (ref 0–100)

## 2010-12-23 LAB — POCT CARDIAC MARKERS
CKMB, poc: <1 ng/mL — ABNORMAL LOW (ref 1.0–8.0)
CKMB, poc: <1 ng/mL — ABNORMAL LOW (ref 1.0–8.0)
Myoglobin, poc: 44.2 ng/mL (ref 12–200)
Myoglobin, poc: 46.1 ng/mL (ref 12–200)
Troponin i, poc: <0.05 ng/mL (ref 0.00–0.09)
Troponin i, poc: <0.05 ng/mL (ref 0.00–0.09)

## 2010-12-24 LAB — BASIC METABOLIC PANEL
BUN: 11 mg/dL (ref 6–23)
BUN: 12 mg/dL (ref 6–23)
BUN: 9 mg/dL (ref 6–23)
CO2: 29 mEq/L (ref 19–32)
CO2: 30 mEq/L (ref 19–32)
CO2: 35 mEq/L — ABNORMAL HIGH (ref 19–32)
Calcium: 8 mg/dL — ABNORMAL LOW (ref 8.4–10.5)
Calcium: 8.4 mg/dL (ref 8.4–10.5)
Calcium: 8.9 mg/dL (ref 8.4–10.5)
Chloride: 102 mEq/L (ref 96–112)
Chloride: 96 mEq/L (ref 96–112)
Chloride: 97 mEq/L (ref 96–112)
Creatinine, Ser: 0.56 mg/dL (ref 0.4–1.2)
Creatinine, Ser: 0.61 mg/dL (ref 0.4–1.2)
Creatinine, Ser: 0.68 mg/dL (ref 0.4–1.2)
GFR calc Af Amer: >60 mL/min (ref >60)
GFR calc Af Amer: >60 mL/min (ref >60)
GFR calc Af Amer: >60 mL/min (ref >60)
GFR calc non Af Amer: >60 mL/min (ref >60)
GFR calc non Af Amer: >60 mL/min (ref >60)
GFR calc non Af Amer: >60 mL/min (ref >60)
Glucose, Bld: 172 mg/dL — ABNORMAL HIGH (ref 70–99)
Glucose, Bld: 181 mg/dL — ABNORMAL HIGH (ref 70–99)
Glucose, Bld: 195 mg/dL — ABNORMAL HIGH (ref 70–99)
Potassium: 3.3 mEq/L — ABNORMAL LOW (ref 3.5–5.1)
Potassium: 3.5 mEq/L (ref 3.5–5.1)
Potassium: 4.5 mEq/L (ref 3.5–5.1)
Sodium: 138 mEq/L (ref 135–145)
Sodium: 138 mEq/L (ref 135–145)
Sodium: 140 mEq/L (ref 135–145)

## 2010-12-24 LAB — GLUCOSE, CAPILLARY
Glucose-Capillary: 134 mg/dL — ABNORMAL HIGH (ref 70–99)
Glucose-Capillary: 137 mg/dL — ABNORMAL HIGH (ref 70–99)
Glucose-Capillary: 143 mg/dL — ABNORMAL HIGH (ref 70–99)
Glucose-Capillary: 143 mg/dL — ABNORMAL HIGH (ref 70–99)
Glucose-Capillary: 145 mg/dL — ABNORMAL HIGH (ref 70–99)
Glucose-Capillary: 146 mg/dL — ABNORMAL HIGH (ref 70–99)
Glucose-Capillary: 146 mg/dL — ABNORMAL HIGH (ref 70–99)
Glucose-Capillary: 153 mg/dL — ABNORMAL HIGH (ref 70–99)
Glucose-Capillary: 157 mg/dL — ABNORMAL HIGH (ref 70–99)
Glucose-Capillary: 158 mg/dL — ABNORMAL HIGH (ref 70–99)
Glucose-Capillary: 159 mg/dL — ABNORMAL HIGH (ref 70–99)
Glucose-Capillary: 160 mg/dL — ABNORMAL HIGH (ref 70–99)
Glucose-Capillary: 165 mg/dL — ABNORMAL HIGH (ref 70–99)
Glucose-Capillary: 175 mg/dL — ABNORMAL HIGH (ref 70–99)
Glucose-Capillary: 176 mg/dL — ABNORMAL HIGH (ref 70–99)
Glucose-Capillary: 177 mg/dL — ABNORMAL HIGH (ref 70–99)
Glucose-Capillary: 177 mg/dL — ABNORMAL HIGH (ref 70–99)
Glucose-Capillary: 181 mg/dL — ABNORMAL HIGH (ref 70–99)
Glucose-Capillary: 191 mg/dL — ABNORMAL HIGH (ref 70–99)
Glucose-Capillary: 205 mg/dL — ABNORMAL HIGH (ref 70–99)
Glucose-Capillary: 205 mg/dL — ABNORMAL HIGH (ref 70–99)
Glucose-Capillary: 211 mg/dL — ABNORMAL HIGH (ref 70–99)
Glucose-Capillary: 221 mg/dL — ABNORMAL HIGH (ref 70–99)
Glucose-Capillary: 250 mg/dL — ABNORMAL HIGH (ref 70–99)
Glucose-Capillary: 254 mg/dL — ABNORMAL HIGH (ref 70–99)

## 2010-12-24 LAB — URINALYSIS, ROUTINE W REFLEX MICROSCOPIC
Bilirubin Urine: NEGATIVE
Bilirubin Urine: NEGATIVE
Glucose, UA: NEGATIVE mg/dL
Glucose, UA: NEGATIVE mg/dL
Hgb urine dipstick: NEGATIVE
Hgb urine dipstick: NEGATIVE
Ketones, ur: NEGATIVE mg/dL
Ketones, ur: NEGATIVE mg/dL
Nitrite: NEGATIVE
Nitrite: NEGATIVE
Protein, ur: NEGATIVE mg/dL
Protein, ur: NEGATIVE mg/dL
Specific Gravity, Urine: 1.007 (ref 1.005–1.030)
Specific Gravity, Urine: 1.013 (ref 1.005–1.030)
Urobilinogen, UA: 0.2 mg/dL (ref 0.0–1.0)
Urobilinogen, UA: 0.2 mg/dL (ref 0.0–1.0)
pH: 6 (ref 5.0–8.0)
pH: 7 (ref 5.0–8.0)

## 2010-12-24 LAB — TRIGLYCERIDES: Triglycerides: 740 mg/dL — ABNORMAL HIGH (ref <150)

## 2010-12-24 LAB — CARDIAC PANEL(CRET KIN+CKTOT+MB+TROPI)
CK, MB: 0.8 ng/mL (ref 0.3–4.0)
CK, MB: 0.9 ng/mL (ref 0.3–4.0)
CK, MB: 1 ng/mL (ref 0.3–4.0)
CK, MB: 1 ng/mL (ref 0.3–4.0)
Relative Index: INVALID (ref 0.0–2.5)
Relative Index: INVALID (ref 0.0–2.5)
Relative Index: INVALID (ref 0.0–2.5)
Relative Index: INVALID (ref 0.0–2.5)
Total CK: 27 U/L (ref 7–177)
Total CK: 33 U/L (ref 7–177)
Total CK: 38 U/L (ref 7–177)
Total CK: 39 U/L (ref 7–177)
Troponin I: 0.01 ng/mL (ref 0.00–0.06)
Troponin I: 0.02 ng/mL (ref 0.00–0.06)
Troponin I: 0.02 ng/mL (ref 0.00–0.06)
Troponin I: 0.02 ng/mL (ref 0.00–0.06)

## 2010-12-24 LAB — LIPID PANEL
Cholesterol: 171 mg/dL (ref 0–200)
HDL: 36 mg/dL — ABNORMAL LOW (ref >39)
LDL Cholesterol: UNDETERMINED mg/dL (ref 0–99)
Total CHOL/HDL Ratio: 4.8 RATIO
Triglycerides: 495 mg/dL — ABNORMAL HIGH (ref <150)
VLDL: UNDETERMINED mg/dL (ref 0–40)

## 2010-12-24 LAB — CBC
HCT: 31.6 % — ABNORMAL LOW (ref 36.0–46.0)
HCT: 32.6 % — ABNORMAL LOW (ref 36.0–46.0)
HCT: 35.8 % — ABNORMAL LOW (ref 36.0–46.0)
Hemoglobin: 10.9 g/dL — ABNORMAL LOW (ref 12.0–15.0)
Hemoglobin: 11.2 g/dL — ABNORMAL LOW (ref 12.0–15.0)
Hemoglobin: 12.2 g/dL (ref 12.0–15.0)
MCH: 32.1 pg (ref 26.0–34.0)
MCH: 32.3 pg (ref 26.0–34.0)
MCH: 32.4 pg (ref 26.0–34.0)
MCHC: 34.1 g/dL (ref 30.0–36.0)
MCHC: 34.4 g/dL (ref 30.0–36.0)
MCHC: 34.4 g/dL (ref 30.0–36.0)
MCV: 93.8 fL (ref 78.0–100.0)
MCV: 94 fL (ref 78.0–100.0)
MCV: 94.2 fL (ref 78.0–100.0)
Platelets: 247 10*3/uL (ref 150–400)
Platelets: 248 10*3/uL (ref 150–400)
Platelets: 295 10*3/uL (ref 150–400)
RBC: 3.36 MIL/uL — ABNORMAL LOW (ref 3.87–5.11)
RBC: 3.48 MIL/uL — ABNORMAL LOW (ref 3.87–5.11)
RBC: 3.81 MIL/uL — ABNORMAL LOW (ref 3.87–5.11)
RDW: 12.6 % (ref 11.5–15.5)
RDW: 12.7 % (ref 11.5–15.5)
RDW: 12.8 % (ref 11.5–15.5)
WBC: 11.2 10*3/uL — ABNORMAL HIGH (ref 4.0–10.5)
WBC: 11.7 10*3/uL — ABNORMAL HIGH (ref 4.0–10.5)
WBC: 9.4 10*3/uL (ref 4.0–10.5)

## 2010-12-24 LAB — POCT CARDIAC MARKERS
CKMB, poc: <1 ng/mL — ABNORMAL LOW (ref 1.0–8.0)
Myoglobin, poc: 42.6 ng/mL (ref 12–200)
Troponin i, poc: <0.05 ng/mL (ref 0.00–0.09)

## 2010-12-24 LAB — DIFFERENTIAL
Basophils Absolute: 0 10*3/uL (ref 0.0–0.1)
Basophils Relative: 0 % (ref 0–1)
Eosinophils Absolute: 0.1 10*3/uL (ref 0.0–0.7)
Eosinophils Relative: 1 % (ref 0–5)
Lymphocytes Relative: 19 % (ref 12–46)
Lymphs Abs: 2.2 10*3/uL (ref 0.7–4.0)
Monocytes Absolute: 0.4 10*3/uL (ref 0.1–1.0)
Monocytes Relative: 4 % (ref 3–12)
Neutro Abs: 8.9 10*3/uL — ABNORMAL HIGH (ref 1.7–7.7)
Neutrophils Relative %: 77 % (ref 43–77)

## 2010-12-24 LAB — PROTIME-INR
INR: 0.92 (ref 0.00–1.49)
INR: 1.03 (ref 0.00–1.49)
Prothrombin Time: 12.6 seconds (ref 11.6–15.2)
Prothrombin Time: 13.7 seconds (ref 11.6–15.2)

## 2010-12-24 LAB — URINE MICROSCOPIC-ADD ON

## 2010-12-24 LAB — HEMOGLOBIN A1C
Hgb A1c MFr Bld: 7.2 % — ABNORMAL HIGH (ref <5.7)
Mean Plasma Glucose: 160 mg/dL — ABNORMAL HIGH (ref <117)

## 2010-12-24 LAB — URINE CULTURE
Colony Count: >=100000
Culture  Setup Time: 201110060120
Special Requests: NEGATIVE

## 2010-12-24 LAB — HEPARIN LEVEL (UNFRACTIONATED): Heparin Unfractionated: 0.16 IU/mL — ABNORMAL LOW (ref 0.30–0.70)

## 2010-12-24 LAB — D-DIMER, QUANTITATIVE: D-Dimer, Quant: 0.28 ug/mL-FEU (ref 0.00–0.48)

## 2010-12-24 LAB — APTT: aPTT: 29 seconds (ref 24–37)

## 2010-12-25 LAB — POCT I-STAT, CHEM 8
BUN: 14 mg/dL (ref 6–23)
Calcium, Ion: 1.17 mmol/L (ref 1.12–1.32)
Chloride: 98 mEq/L (ref 96–112)
Creatinine, Ser: 0.5 mg/dL (ref 0.4–1.2)
Glucose, Bld: 142 mg/dL — ABNORMAL HIGH (ref 70–99)
HCT: 41 % (ref 36.0–46.0)
Hemoglobin: 13.9 g/dL (ref 12.0–15.0)
Potassium: 3.8 mEq/L (ref 3.5–5.1)
Sodium: 140 mEq/L (ref 135–145)
TCO2: 34 mmol/L (ref 0–100)

## 2010-12-26 LAB — POCT I-STAT, CHEM 8
BUN: 13 mg/dL (ref 6–23)
Calcium, Ion: 1.06 mmol/L — ABNORMAL LOW (ref 1.12–1.32)
Chloride: 96 mEq/L (ref 96–112)
Creatinine, Ser: 0.7 mg/dL (ref 0.4–1.2)
Glucose, Bld: 128 mg/dL — ABNORMAL HIGH (ref 70–99)
HCT: 40 % (ref 36.0–46.0)
Hemoglobin: 13.6 g/dL (ref 12.0–15.0)
Potassium: 3.1 mEq/L — ABNORMAL LOW (ref 3.5–5.1)
Sodium: 140 mEq/L (ref 135–145)
TCO2: 32 mmol/L (ref 0–100)

## 2010-12-28 LAB — CBC
HCT: 36.3 % (ref 36.0–46.0)
HCT: 37 % (ref 36.0–46.0)
Hemoglobin: 12.5 g/dL (ref 12.0–15.0)
Hemoglobin: 12.5 g/dL (ref 12.0–15.0)
MCHC: 34.3 g/dL (ref 30.0–36.0)
MCHC: 33.7 g/dL (ref 30.0–36.0)
MCV: 93.1 fL (ref 78.0–100.0)
MCV: 96.6 fL (ref 78.0–100.0)
Platelets: 302 10*3/uL (ref 150–400)
Platelets: 332 10*3/uL (ref 150–400)
RBC: 3.9 MIL/uL (ref 3.87–5.11)
RBC: 3.83 MIL/uL — ABNORMAL LOW (ref 3.87–5.11)
RDW: 12.6 % (ref 11.5–15.5)
RDW: 13.4 % (ref 11.5–15.5)
WBC: 13.2 10*3/uL — ABNORMAL HIGH (ref 4.0–10.5)
WBC: 17.2 10*3/uL — ABNORMAL HIGH (ref 4.0–10.5)

## 2010-12-28 LAB — URINALYSIS, ROUTINE W REFLEX MICROSCOPIC
Bilirubin Urine: NEGATIVE
Bilirubin Urine: NEGATIVE
Glucose, UA: NEGATIVE mg/dL
Glucose, UA: NEGATIVE mg/dL
Hgb urine dipstick: NEGATIVE
Ketones, ur: NEGATIVE mg/dL
Ketones, ur: NEGATIVE mg/dL
Nitrite: NEGATIVE
Nitrite: NEGATIVE
Protein, ur: NEGATIVE mg/dL
Protein, ur: NEGATIVE mg/dL
Specific Gravity, Urine: 1.022 (ref 1.005–1.030)
Specific Gravity, Urine: 1.028 (ref 1.005–1.030)
Urobilinogen, UA: 0.2 mg/dL (ref 0.0–1.0)
Urobilinogen, UA: 0.2 mg/dL (ref 0.0–1.0)
pH: 6 (ref 5.0–8.0)
pH: 5.5 (ref 5.0–8.0)

## 2010-12-28 LAB — POCT I-STAT, CHEM 8
BUN: 16 mg/dL (ref 6–23)
Calcium, Ion: 1.03 mmol/L — ABNORMAL LOW (ref 1.12–1.32)
Chloride: 97 mEq/L (ref 96–112)
Creatinine, Ser: 0.7 mg/dL (ref 0.4–1.2)
Glucose, Bld: 163 mg/dL — ABNORMAL HIGH (ref 70–99)
HCT: 37 % (ref 36.0–46.0)
Hemoglobin: 12.6 g/dL (ref 12.0–15.0)
Potassium: 3.1 mEq/L — ABNORMAL LOW (ref 3.5–5.1)
Sodium: 139 mEq/L (ref 135–145)
TCO2: 32 mmol/L (ref 0–100)

## 2010-12-28 LAB — BASIC METABOLIC PANEL
BUN: 11 mg/dL (ref 6–23)
CO2: 26 mEq/L (ref 19–32)
Calcium: 9.2 mg/dL (ref 8.4–10.5)
Chloride: 100 mEq/L (ref 96–112)
Creatinine, Ser: 0.63 mg/dL (ref 0.4–1.2)
GFR calc Af Amer: >60 mL/min (ref >60)
GFR calc non Af Amer: >60 mL/min (ref >60)
Glucose, Bld: 222 mg/dL — ABNORMAL HIGH (ref 70–99)
Potassium: 4.1 mEq/L (ref 3.5–5.1)
Sodium: 137 mEq/L (ref 135–145)

## 2010-12-28 LAB — URINE MICROSCOPIC-ADD ON

## 2010-12-28 LAB — GLUCOSE, CAPILLARY
Glucose-Capillary: 176 mg/dL — ABNORMAL HIGH (ref 70–99)
Glucose-Capillary: 224 mg/dL — ABNORMAL HIGH (ref 70–99)

## 2010-12-28 LAB — DIFFERENTIAL
Basophils Absolute: 0 10*3/uL (ref 0.0–0.1)
Basophils Absolute: 0.1 10*3/uL (ref 0.0–0.1)
Basophils Relative: 0 % (ref 0–1)
Basophils Relative: 0 % (ref 0–1)
Eosinophils Absolute: 0.1 10*3/uL (ref 0.0–0.7)
Eosinophils Absolute: 0.2 10*3/uL (ref 0.0–0.7)
Eosinophils Relative: 1 % (ref 0–5)
Eosinophils Relative: 1 % (ref 0–5)
Lymphocytes Relative: 19 % (ref 12–46)
Lymphocytes Relative: 30 % (ref 12–46)
Lymphs Abs: 2.6 10*3/uL (ref 0.7–4.0)
Lymphs Abs: 5.1 10*3/uL — ABNORMAL HIGH (ref 0.7–4.0)
Monocytes Absolute: 0.6 10*3/uL (ref 0.1–1.0)
Monocytes Absolute: 0.9 10*3/uL (ref 0.1–1.0)
Monocytes Relative: 4 % (ref 3–12)
Monocytes Relative: 5 % (ref 3–12)
Neutro Abs: 9.9 10*3/uL — ABNORMAL HIGH (ref 1.7–7.7)
Neutro Abs: 10.9 10*3/uL — ABNORMAL HIGH (ref 1.7–7.7)
Neutrophils Relative %: 75 % (ref 43–77)
Neutrophils Relative %: 63 % (ref 43–77)

## 2010-12-28 LAB — HEPATIC FUNCTION PANEL
ALT: 18 U/L (ref 0–35)
AST: 27 U/L (ref 0–37)
Albumin: 3.8 g/dL (ref 3.5–5.2)
Alkaline Phosphatase: 66 U/L (ref 39–117)
Bilirubin, Direct: 0.2 mg/dL (ref 0.0–0.3)
Indirect Bilirubin: 0.4 mg/dL (ref 0.3–0.9)
Total Bilirubin: 0.6 mg/dL (ref 0.3–1.2)
Total Protein: 7.1 g/dL (ref 6.0–8.3)

## 2010-12-28 LAB — URINE CULTURE
Colony Count: 30000
Colony Count: 50000

## 2010-12-30 ENCOUNTER — Emergency Department (HOSPITAL_COMMUNITY)
Admission: EM | Admit: 2010-12-30 | Discharge: 2010-12-31 | Disposition: A | Discharge status: Home / Self Care | Payer: BC Managed Care – PPO | Attending: Emergency Medicine | Admitting: Emergency Medicine

## 2010-12-30 ENCOUNTER — Emergency Department (HOSPITAL_COMMUNITY): Payer: BC Managed Care – PPO

## 2010-12-30 DIAGNOSIS — R209 Unspecified disturbances of skin sensation: Secondary | ICD-10-CM | POA: Insufficient documentation

## 2010-12-30 DIAGNOSIS — I10 Essential (primary) hypertension: Secondary | ICD-10-CM | POA: Insufficient documentation

## 2010-12-30 DIAGNOSIS — E785 Hyperlipidemia, unspecified: Secondary | ICD-10-CM | POA: Insufficient documentation

## 2010-12-30 DIAGNOSIS — D869 Sarcoidosis, unspecified: Secondary | ICD-10-CM | POA: Insufficient documentation

## 2010-12-30 DIAGNOSIS — F3289 Other specified depressive episodes: Secondary | ICD-10-CM | POA: Insufficient documentation

## 2010-12-30 DIAGNOSIS — R079 Chest pain, unspecified: Secondary | ICD-10-CM | POA: Insufficient documentation

## 2010-12-30 DIAGNOSIS — Z8639 Personal history of other endocrine, nutritional and metabolic disease: Secondary | ICD-10-CM | POA: Insufficient documentation

## 2010-12-30 DIAGNOSIS — F329 Major depressive disorder, single episode, unspecified: Secondary | ICD-10-CM | POA: Insufficient documentation

## 2010-12-30 DIAGNOSIS — E119 Type 2 diabetes mellitus without complications: Secondary | ICD-10-CM | POA: Insufficient documentation

## 2010-12-30 DIAGNOSIS — R110 Nausea: Secondary | ICD-10-CM | POA: Insufficient documentation

## 2010-12-30 DIAGNOSIS — Z862 Personal history of diseases of the blood and blood-forming organs and certain disorders involving the immune mechanism: Secondary | ICD-10-CM | POA: Insufficient documentation

## 2010-12-30 LAB — POCT CARDIAC MARKERS
CKMB, poc: <1 ng/mL — ABNORMAL LOW (ref 1.0–8.0)
Myoglobin, poc: 45.8 ng/mL (ref 12–200)
Troponin i, poc: <0.05 ng/mL (ref 0.00–0.09)

## 2010-12-30 LAB — DIFFERENTIAL
Basophils Absolute: 0.1 10*3/uL (ref 0.0–0.1)
Basophils Relative: 0 % (ref 0–1)
Eosinophils Absolute: 0.3 10*3/uL (ref 0.0–0.7)
Eosinophils Relative: 2 % (ref 0–5)
Lymphocytes Relative: 36 % (ref 12–46)
Lymphs Abs: 5.7 10*3/uL — ABNORMAL HIGH (ref 0.7–4.0)
Monocytes Absolute: 0.9 10*3/uL (ref 0.1–1.0)
Monocytes Relative: 6 % (ref 3–12)
Neutro Abs: 8.9 10*3/uL — ABNORMAL HIGH (ref 1.7–7.7)
Neutrophils Relative %: 56 % (ref 43–77)

## 2010-12-30 LAB — BASIC METABOLIC PANEL
BUN: 15 mg/dL (ref 6–23)
CO2: 29 mEq/L (ref 19–32)
Calcium: 8.6 mg/dL (ref 8.4–10.5)
Chloride: 96 mEq/L (ref 96–112)
Creatinine, Ser: 0.7 mg/dL (ref 0.4–1.2)
GFR calc Af Amer: >60 mL/min (ref >60)
GFR calc non Af Amer: >60 mL/min (ref >60)
Glucose, Bld: 258 mg/dL — ABNORMAL HIGH (ref 70–99)
Potassium: 3.6 mEq/L (ref 3.5–5.1)
Sodium: 136 mEq/L (ref 135–145)

## 2010-12-30 LAB — CBC
HCT: 38.5 % (ref 36.0–46.0)
Hemoglobin: 13.4 g/dL (ref 12.0–15.0)
MCH: 31.3 pg (ref 26.0–34.0)
MCHC: 34.8 g/dL (ref 30.0–36.0)
MCV: 90 fL (ref 78.0–100.0)
Platelets: 297 10*3/uL (ref 150–400)
RBC: 4.28 MIL/uL (ref 3.87–5.11)
RDW: 12.9 % (ref 11.5–15.5)
WBC: 15.9 10*3/uL — ABNORMAL HIGH (ref 4.0–10.5)

## 2011-01-01 LAB — GLUCOSE, CAPILLARY
Glucose-Capillary: 122 mg/dL — ABNORMAL HIGH (ref 70–99)
Glucose-Capillary: 133 mg/dL — ABNORMAL HIGH (ref 70–99)

## 2011-01-12 LAB — DIFFERENTIAL
Basophils Absolute: 0.3 10*3/uL — ABNORMAL HIGH (ref 0.0–0.1)
Basophils Relative: 2 % — ABNORMAL HIGH (ref 0–1)
Eosinophils Absolute: 0.2 10*3/uL (ref 0.0–0.7)
Eosinophils Relative: 1 % (ref 0–5)
Lymphocytes Relative: 26 % (ref 12–46)
Lymphs Abs: 4.4 10*3/uL — ABNORMAL HIGH (ref 0.7–4.0)
Monocytes Absolute: 1 10*3/uL (ref 0.1–1.0)
Monocytes Relative: 6 % (ref 3–12)
Neutro Abs: 11.2 10*3/uL — ABNORMAL HIGH (ref 1.7–7.7)
Neutrophils Relative %: 66 % (ref 43–77)

## 2011-01-12 LAB — CBC
HCT: 33.9 % — ABNORMAL LOW (ref 36.0–46.0)
Hemoglobin: 11.9 g/dL — ABNORMAL LOW (ref 12.0–15.0)
MCHC: 35 g/dL (ref 30.0–36.0)
MCV: 95.1 fL (ref 78.0–100.0)
Platelets: 330 10*3/uL (ref 150–400)
RBC: 3.57 MIL/uL — ABNORMAL LOW (ref 3.87–5.11)
RDW: 12.8 % (ref 11.5–15.5)
WBC: 17 10*3/uL — ABNORMAL HIGH (ref 4.0–10.5)

## 2011-01-12 LAB — URINE MICROSCOPIC-ADD ON

## 2011-01-12 LAB — COMPREHENSIVE METABOLIC PANEL
ALT: 30 U/L (ref 0–35)
AST: 19 U/L (ref 0–37)
Albumin: 3.4 g/dL — ABNORMAL LOW (ref 3.5–5.2)
Alkaline Phosphatase: 87 U/L (ref 39–117)
BUN: 13 mg/dL (ref 6–23)
CO2: 30 mEq/L (ref 19–32)
Calcium: 8.5 mg/dL (ref 8.4–10.5)
Chloride: 100 mEq/L (ref 96–112)
Creatinine, Ser: 0.63 mg/dL (ref 0.4–1.2)
GFR calc Af Amer: >60 mL/min (ref >60)
GFR calc non Af Amer: >60 mL/min (ref >60)
Glucose, Bld: 139 mg/dL — ABNORMAL HIGH (ref 70–99)
Potassium: 2.9 mEq/L — ABNORMAL LOW (ref 3.5–5.1)
Sodium: 138 mEq/L (ref 135–145)
Total Bilirubin: 0.4 mg/dL (ref 0.3–1.2)
Total Protein: 6.7 g/dL (ref 6.0–8.3)

## 2011-01-12 LAB — LIPASE, BLOOD: Lipase: 22 U/L (ref 11–59)

## 2011-01-12 LAB — URINALYSIS, ROUTINE W REFLEX MICROSCOPIC
Bilirubin Urine: NEGATIVE
Glucose, UA: NEGATIVE mg/dL
Hgb urine dipstick: NEGATIVE
Ketones, ur: NEGATIVE mg/dL
Nitrite: NEGATIVE
Protein, ur: NEGATIVE mg/dL
Specific Gravity, Urine: 1.019 (ref 1.005–1.030)
Urobilinogen, UA: 0.2 mg/dL (ref 0.0–1.0)
pH: 6.5 (ref 5.0–8.0)

## 2011-01-12 LAB — GLUCOSE, CAPILLARY: Glucose-Capillary: 163 mg/dL — ABNORMAL HIGH (ref 70–99)

## 2011-01-13 LAB — POCT URINALYSIS DIP (DEVICE)
Bilirubin Urine: NEGATIVE
Glucose, UA: NEGATIVE mg/dL
Nitrite: NEGATIVE
Protein, ur: NEGATIVE mg/dL
Specific Gravity, Urine: 1.02 (ref 1.005–1.030)
Urobilinogen, UA: 0.2 mg/dL (ref 0.0–1.0)
pH: 5 (ref 5.0–8.0)

## 2011-01-16 LAB — GLUCOSE, CAPILLARY
Glucose-Capillary: 154 mg/dL — ABNORMAL HIGH (ref 70–99)
Glucose-Capillary: 164 mg/dL — ABNORMAL HIGH (ref 70–99)

## 2011-01-17 LAB — DIFFERENTIAL
Basophils Absolute: 0 10*3/uL (ref 0.0–0.1)
Basophils Absolute: 0.2 10*3/uL — ABNORMAL HIGH (ref 0.0–0.1)
Basophils Relative: 0 % (ref 0–1)
Basophils Relative: 1 % (ref 0–1)
Eosinophils Absolute: 0 10*3/uL (ref 0.0–0.7)
Eosinophils Absolute: 0.3 10*3/uL (ref 0.0–0.7)
Eosinophils Relative: 0 % (ref 0–5)
Eosinophils Relative: 2 % (ref 0–5)
Lymphocytes Relative: 38 % (ref 12–46)
Lymphocytes Relative: 7 % — ABNORMAL LOW (ref 12–46)
Lymphs Abs: 0.8 10*3/uL (ref 0.7–4.0)
Lymphs Abs: 6 10*3/uL — ABNORMAL HIGH (ref 0.7–4.0)
Monocytes Absolute: 0.1 10*3/uL (ref 0.1–1.0)
Monocytes Absolute: 0.8 10*3/uL (ref 0.1–1.0)
Monocytes Relative: 1 % — ABNORMAL LOW (ref 3–12)
Monocytes Relative: 5 % (ref 3–12)
Neutro Abs: 11.1 10*3/uL — ABNORMAL HIGH (ref 1.7–7.7)
Neutro Abs: 8.5 10*3/uL — ABNORMAL HIGH (ref 1.7–7.7)
Neutrophils Relative %: 54 % (ref 43–77)
Neutrophils Relative %: 92 % — ABNORMAL HIGH (ref 43–77)

## 2011-01-17 LAB — EXPECTORATED SPUTUM ASSESSMENT W GRAM STAIN, RFLX TO RESP C

## 2011-01-17 LAB — BASIC METABOLIC PANEL
BUN: 16 mg/dL (ref 6–23)
BUN: 17 mg/dL (ref 6–23)
CO2: 28 mEq/L (ref 19–32)
CO2: 31 mEq/L (ref 19–32)
Calcium: 8.4 mg/dL (ref 8.4–10.5)
Calcium: 9 mg/dL (ref 8.4–10.5)
Chloride: 100 mEq/L (ref 96–112)
Chloride: 100 mEq/L (ref 96–112)
Creatinine, Ser: 0.75 mg/dL (ref 0.4–1.2)
Creatinine, Ser: 0.79 mg/dL (ref 0.4–1.2)
GFR calc Af Amer: >60 mL/min (ref >60)
GFR calc Af Amer: >60 mL/min (ref >60)
GFR calc non Af Amer: >60 mL/min (ref >60)
GFR calc non Af Amer: >60 mL/min (ref >60)
Glucose, Bld: 169 mg/dL — ABNORMAL HIGH (ref 70–99)
Glucose, Bld: 189 mg/dL — ABNORMAL HIGH (ref 70–99)
Potassium: 3.6 mEq/L (ref 3.5–5.1)
Potassium: 4 mEq/L (ref 3.5–5.1)
Sodium: 134 mEq/L — ABNORMAL LOW (ref 135–145)
Sodium: 139 mEq/L (ref 135–145)

## 2011-01-17 LAB — CBC
HCT: 32.1 % — ABNORMAL LOW (ref 36.0–46.0)
HCT: 34.2 % — ABNORMAL LOW (ref 36.0–46.0)
HCT: 35 % — ABNORMAL LOW (ref 36.0–46.0)
HCT: 35.6 % — ABNORMAL LOW (ref 36.0–46.0)
Hemoglobin: 11 g/dL — ABNORMAL LOW (ref 12.0–15.0)
Hemoglobin: 11.8 g/dL — ABNORMAL LOW (ref 12.0–15.0)
Hemoglobin: 12 g/dL (ref 12.0–15.0)
Hemoglobin: 12.4 g/dL (ref 12.0–15.0)
MCHC: 34.3 g/dL (ref 30.0–36.0)
MCHC: 34.3 g/dL (ref 30.0–36.0)
MCHC: 34.3 g/dL (ref 30.0–36.0)
MCHC: 35 g/dL (ref 30.0–36.0)
MCV: 92.9 fL (ref 78.0–100.0)
MCV: 93.4 fL (ref 78.0–100.0)
MCV: 94 fL (ref 78.0–100.0)
MCV: 94 fL (ref 78.0–100.0)
Platelets: 256 10*3/uL (ref 150–400)
Platelets: 298 10*3/uL (ref 150–400)
Platelets: 299 10*3/uL (ref 150–400)
Platelets: 312 10*3/uL (ref 150–400)
RBC: 3.41 MIL/uL — ABNORMAL LOW (ref 3.87–5.11)
RBC: 3.66 MIL/uL — ABNORMAL LOW (ref 3.87–5.11)
RBC: 3.72 MIL/uL — ABNORMAL LOW (ref 3.87–5.11)
RBC: 3.84 MIL/uL — ABNORMAL LOW (ref 3.87–5.11)
RDW: 13.2 % (ref 11.5–15.5)
RDW: 13.3 % (ref 11.5–15.5)
RDW: 13.5 % (ref 11.5–15.5)
RDW: 13.8 % (ref 11.5–15.5)
WBC: 12.1 10*3/uL — ABNORMAL HIGH (ref 4.0–10.5)
WBC: 13.8 10*3/uL — ABNORMAL HIGH (ref 4.0–10.5)
WBC: 15.8 10*3/uL — ABNORMAL HIGH (ref 4.0–10.5)
WBC: 18.5 10*3/uL — ABNORMAL HIGH (ref 4.0–10.5)

## 2011-01-17 LAB — POCT CARDIAC MARKERS
CKMB, poc: <1 ng/mL — ABNORMAL LOW (ref 1.0–8.0)
Myoglobin, poc: 57 ng/mL (ref 12–200)
Troponin i, poc: <0.05 ng/mL (ref 0.00–0.09)

## 2011-01-17 LAB — CULTURE, BLOOD (ROUTINE X 2)
Culture: NO GROWTH
Culture: NO GROWTH

## 2011-01-17 LAB — BLOOD GAS, ARTERIAL
Acid-Base Excess: 0.3 mmol/L (ref 0.0–2.0)
Acid-Base Excess: 3.1 mmol/L — ABNORMAL HIGH (ref 0.0–2.0)
Bicarbonate: 24.4 mEq/L — ABNORMAL HIGH (ref 20.0–24.0)
Bicarbonate: 27.5 mEq/L — ABNORMAL HIGH (ref 20.0–24.0)
FIO2: 0.21 %
FIO2: 21 %
O2 Saturation: 92.8 %
O2 Saturation: 96.6 %
Patient temperature: 98.6
Patient temperature: 98.6
TCO2: 25.6 mmol/L (ref 0–100)
TCO2: 28.8 mmol/L (ref 0–100)
pCO2 arterial: 39.5 mmHg (ref 35.0–45.0)
pCO2 arterial: 45 mmHg (ref 35.0–45.0)
pH, Arterial: 7.403 — ABNORMAL HIGH (ref 7.350–7.400)
pH, Arterial: 7.408 — ABNORMAL HIGH (ref 7.350–7.400)
pO2, Arterial: 65.4 mmHg — ABNORMAL LOW (ref 80.0–100.0)
pO2, Arterial: 79.5 mmHg — ABNORMAL LOW (ref 80.0–100.0)

## 2011-01-17 LAB — COMPREHENSIVE METABOLIC PANEL
ALT: 21 U/L (ref 0–35)
ALT: 21 U/L (ref 0–35)
AST: 38 U/L — ABNORMAL HIGH (ref 0–37)
AST: 45 U/L — ABNORMAL HIGH (ref 0–37)
Albumin: 3.8 g/dL (ref 3.5–5.2)
Albumin: 4 g/dL (ref 3.5–5.2)
Alkaline Phosphatase: 79 U/L (ref 39–117)
Alkaline Phosphatase: 81 U/L (ref 39–117)
BUN: 11 mg/dL (ref 6–23)
BUN: 9 mg/dL (ref 6–23)
CO2: 22 mEq/L (ref 19–32)
CO2: 24 mEq/L (ref 19–32)
Calcium: 8.8 mg/dL (ref 8.4–10.5)
Calcium: 9.3 mg/dL (ref 8.4–10.5)
Chloride: 97 mEq/L (ref 96–112)
Chloride: 98 mEq/L (ref 96–112)
Creatinine, Ser: 0.66 mg/dL (ref 0.4–1.2)
Creatinine, Ser: 0.94 mg/dL (ref 0.4–1.2)
GFR calc Af Amer: >60 mL/min (ref >60)
GFR calc Af Amer: >60 mL/min (ref >60)
GFR calc non Af Amer: >60 mL/min (ref >60)
GFR calc non Af Amer: >60 mL/min (ref >60)
Glucose, Bld: 166 mg/dL — ABNORMAL HIGH (ref 70–99)
Glucose, Bld: 291 mg/dL — ABNORMAL HIGH (ref 70–99)
Potassium: 2.9 mEq/L — ABNORMAL LOW (ref 3.5–5.1)
Potassium: 3.9 mEq/L (ref 3.5–5.1)
Sodium: 135 mEq/L (ref 135–145)
Sodium: 135 mEq/L (ref 135–145)
Total Bilirubin: 0.7 mg/dL (ref 0.3–1.2)
Total Bilirubin: 1.5 mg/dL — ABNORMAL HIGH (ref 0.3–1.2)
Total Protein: 7.6 g/dL (ref 6.0–8.3)
Total Protein: 7.7 g/dL (ref 6.0–8.3)

## 2011-01-17 LAB — PROTIME-INR
INR: 1 (ref 0.00–1.49)
Prothrombin Time: 13.3 seconds (ref 11.6–15.2)

## 2011-01-17 LAB — URINE CULTURE: Colony Count: 30000

## 2011-01-17 LAB — GLUCOSE, CAPILLARY
Glucose-Capillary: 139 mg/dL — ABNORMAL HIGH (ref 70–99)
Glucose-Capillary: 143 mg/dL — ABNORMAL HIGH (ref 70–99)
Glucose-Capillary: 153 mg/dL — ABNORMAL HIGH (ref 70–99)
Glucose-Capillary: 154 mg/dL — ABNORMAL HIGH (ref 70–99)
Glucose-Capillary: 180 mg/dL — ABNORMAL HIGH (ref 70–99)
Glucose-Capillary: 187 mg/dL — ABNORMAL HIGH (ref 70–99)
Glucose-Capillary: 190 mg/dL — ABNORMAL HIGH (ref 70–99)
Glucose-Capillary: 208 mg/dL — ABNORMAL HIGH (ref 70–99)
Glucose-Capillary: 212 mg/dL — ABNORMAL HIGH (ref 70–99)
Glucose-Capillary: 251 mg/dL — ABNORMAL HIGH (ref 70–99)

## 2011-01-17 LAB — POCT I-STAT 3, ART BLOOD GAS (G3+)
Acid-Base Excess: 5 mmol/L — ABNORMAL HIGH (ref 0.0–2.0)
Bicarbonate: 23.5 mEq/L (ref 20.0–24.0)
O2 Saturation: 99 %
TCO2: 24 mmol/L (ref 0–100)
pCO2 arterial: 20.7 mmHg — ABNORMAL LOW (ref 35.0–45.0)
pH, Arterial: 7.661 (ref 7.350–7.400)
pO2, Arterial: 103 mmHg — ABNORMAL HIGH (ref 80.0–100.0)

## 2011-01-17 LAB — URINALYSIS, ROUTINE W REFLEX MICROSCOPIC
Bilirubin Urine: NEGATIVE
Glucose, UA: NEGATIVE mg/dL
Hgb urine dipstick: NEGATIVE
Ketones, ur: NEGATIVE mg/dL
Nitrite: NEGATIVE
Protein, ur: NEGATIVE mg/dL
Specific Gravity, Urine: 1.024 (ref 1.005–1.030)
Urobilinogen, UA: 0.2 mg/dL (ref 0.0–1.0)
pH: 6.5 (ref 5.0–8.0)

## 2011-01-17 LAB — LACTIC ACID, PLASMA
Lactic Acid, Venous: 2.6 mmol/L — ABNORMAL HIGH (ref 0.5–2.2)
Lactic Acid, Venous: 4.8 mmol/L — ABNORMAL HIGH (ref 0.5–2.2)
Lactic Acid, Venous: 8 mmol/L — ABNORMAL HIGH (ref 0.5–2.2)

## 2011-01-17 LAB — CULTURE, RESPIRATORY W GRAM STAIN: Culture: NORMAL

## 2011-01-17 LAB — LIPASE, BLOOD: Lipase: 12 U/L (ref 11–59)

## 2011-01-17 LAB — APTT: aPTT: 25 seconds (ref 24–37)

## 2011-01-17 LAB — D-DIMER, QUANTITATIVE: D-Dimer, Quant: 0.26 ug/mL-FEU (ref 0.00–0.48)

## 2011-01-17 LAB — ANGIOTENSIN CONVERTING ENZYME: Angiotensin-Converting Enzyme: 19 U/L (ref 9–67)

## 2011-01-17 LAB — AMYLASE: Amylase: 27 U/L (ref 27–131)

## 2011-01-20 ENCOUNTER — Ambulatory Visit (INDEPENDENT_AMBULATORY_CARE_PROVIDER_SITE_OTHER): Payer: BC Managed Care – PPO | Admitting: Internal Medicine

## 2011-01-20 ENCOUNTER — Encounter: Payer: Self-pay | Admitting: Internal Medicine

## 2011-01-20 ENCOUNTER — Other Ambulatory Visit: Payer: Self-pay | Admitting: Internal Medicine

## 2011-01-20 DIAGNOSIS — E119 Type 2 diabetes mellitus without complications: Secondary | ICD-10-CM

## 2011-01-20 DIAGNOSIS — E785 Hyperlipidemia, unspecified: Secondary | ICD-10-CM

## 2011-01-20 DIAGNOSIS — J99 Respiratory disorders in diseases classified elsewhere: Secondary | ICD-10-CM

## 2011-01-20 DIAGNOSIS — G4730 Sleep apnea, unspecified: Secondary | ICD-10-CM

## 2011-01-20 DIAGNOSIS — I10 Essential (primary) hypertension: Secondary | ICD-10-CM

## 2011-01-20 DIAGNOSIS — M109 Gout, unspecified: Secondary | ICD-10-CM

## 2011-01-20 DIAGNOSIS — D860 Sarcoidosis of lung: Secondary | ICD-10-CM

## 2011-01-20 DIAGNOSIS — D869 Sarcoidosis, unspecified: Secondary | ICD-10-CM

## 2011-01-20 LAB — BASIC METABOLIC PANEL
BUN: 13 mg/dL (ref 6–23)
CO2: 35 mEq/L — ABNORMAL HIGH (ref 19–32)
Calcium: 8.7 mg/dL (ref 8.4–10.5)
Chloride: 93 mEq/L — ABNORMAL LOW (ref 96–112)
Creatinine, Ser: 0.6 mg/dL (ref 0.4–1.2)
GFR: 116.27 mL/min (ref >60.00)
Glucose, Bld: 215 mg/dL — ABNORMAL HIGH (ref 70–99)
Potassium: 4 mEq/L (ref 3.5–5.1)
Sodium: 137 mEq/L (ref 135–145)

## 2011-01-20 LAB — LIPID PANEL
Cholesterol: 156 mg/dL (ref 0–200)
HDL: 39.9 mg/dL (ref >39.00)
Total CHOL/HDL Ratio: 4
Triglycerides: 416 mg/dL — ABNORMAL HIGH (ref 0.0–149.0)
VLDL: 83.2 mg/dL — ABNORMAL HIGH (ref 0.0–40.0)

## 2011-01-20 LAB — HEPATIC FUNCTION PANEL
ALT: 19 U/L (ref 0–35)
AST: 22 U/L (ref 0–37)
Albumin: 3.8 g/dL (ref 3.5–5.2)
Alkaline Phosphatase: 85 U/L (ref 39–117)
Bilirubin, Direct: 0.2 mg/dL (ref 0.0–0.3)
Total Bilirubin: 1.1 mg/dL (ref 0.3–1.2)
Total Protein: 6.9 g/dL (ref 6.0–8.3)

## 2011-01-20 LAB — HEMOGLOBIN A1C: Hgb A1c MFr Bld: 8.7 % — ABNORMAL HIGH (ref 4.6–6.5)

## 2011-01-20 LAB — LDL CHOLESTEROL, DIRECT: Direct LDL: 73.1 mg/dL

## 2011-01-20 NOTE — Telephone Encounter (Signed)
Opened in error

## 2011-01-20 NOTE — Assessment & Plan Note (Signed)
Remicade has recently been discontinued. The patient will follow up with Dr. Delford Field. It is unclear to me that any of her symptoms of fatigue or myalgias are related to sarcoid. I suspect symptoms are not related to sarcoid.

## 2011-01-20 NOTE — Assessment & Plan Note (Signed)
Previously controlled Check labs today 

## 2011-01-20 NOTE — Progress Notes (Signed)
  Subjective:    Patient ID: [name], female    DOB: 24-Jul-1961, 50 y.o.   MRN: [id number]  HPI  Pt has developed diffuse pain---she is being evaluated by rheumatology.   Sarcoid: remicade was discontinued by rheumatology---she states that it is no longer "working"  Fatigue---seems to be worsening  HTN---tolerating meds without difficulty  Chronically elevated WBC---she brings in labs from [facility] documenting WBC of 13.9K Past Medical History  Diagnosis Date  . Sarcoidosis 04/10/2007  . DIABETES MELLITUS, TYPE II 08/21/2006  . HYPERLIPIDEMIA 08/21/2006  . GOUT 08/20/2010  . OBESITY 06/04/2009  . ANEMIA-UNSPECIFIED 09/18/2009  . HYPERTENSION 08/21/2006  . GERD 08/21/2006  . SLEEP APNEA 04/21/2009   Past Surgical History  Procedure Date  . Ventral hernia repair   . Nissen fundoplication   . Cholecystectomy   . Abdominal hysterectomy   . Knee arthroscopy     right    reports that she has never smoked. She does not have any smokeless tobacco history on file. She reports that she does not drink alcohol or use illicit drugs. family history includes Asthma in her mother; COPD in her mother; Diabetes in her father; Emphysema in her mother; Heart attack in her father; and Sarcoidosis in her maternal uncle. Allergies  Allergen Reactions  . Methotrexate     REACTION: peri-oral and buccal lesions.     Review of Systems  patient denies chest pain, shortness of breath, orthopnea. Denies lower extremity edema, abdominal pain, change in appetite, change in bowel movements. Patient denies rashes, musculoskeletal complaints. No other specific complaints in a complete review of systems.      Objective:   Physical Exam  Well-developed well-nourished female in no acute distress. HEENT exam atraumatic, normocephalic, extraocular muscles are intact. Neck is supple. No jugular venous distention no thyromegaly. Chest clear to auscultation without increased work of breathing. Cardiac exam S1  and S2 are regular. Abdominal exam active bowel sounds, soft, nontender, overweight. . Extremities no edema. Neurologic exam she is alert without any motor sensory deficits. Gait is normal.        Assessment & Plan:

## 2011-01-20 NOTE — Assessment & Plan Note (Signed)
Reasonable control. Continue current medications. 

## 2011-01-20 NOTE — Assessment & Plan Note (Signed)
I have reviewed previous medications and previous laboratory work. She is due to have labs checked today. Those will be drawn in the office today.

## 2011-01-21 LAB — RAPID STREP SCREEN (MED CTR MEBANE ONLY): Streptococcus, Group A Screen (Direct): NEGATIVE

## 2011-01-22 ENCOUNTER — Other Ambulatory Visit: Payer: Self-pay | Admitting: Internal Medicine

## 2011-01-22 DIAGNOSIS — E119 Type 2 diabetes mellitus without complications: Secondary | ICD-10-CM

## 2011-01-25 ENCOUNTER — Encounter: Payer: Self-pay | Admitting: Internal Medicine

## 2011-01-26 LAB — GLUCOSE, CAPILLARY
Glucose-Capillary: 115 mg/dL — ABNORMAL HIGH (ref 70–99)
Glucose-Capillary: 145 mg/dL — ABNORMAL HIGH (ref 70–99)
Glucose-Capillary: 160 mg/dL — ABNORMAL HIGH (ref 70–99)
Glucose-Capillary: 168 mg/dL — ABNORMAL HIGH (ref 70–99)
Glucose-Capillary: 173 mg/dL — ABNORMAL HIGH (ref 70–99)

## 2011-01-26 LAB — POCT I-STAT, CHEM 8
BUN: 18 mg/dL (ref 6–23)
Calcium, Ion: 0.95 mmol/L — ABNORMAL LOW (ref 1.12–1.32)
Chloride: 100 mEq/L (ref 96–112)
Creatinine, Ser: 0.7 mg/dL (ref 0.4–1.2)
Glucose, Bld: 203 mg/dL — ABNORMAL HIGH (ref 70–99)
HCT: 36 % (ref 36.0–46.0)
Hemoglobin: 12.2 g/dL (ref 12.0–15.0)
Potassium: 3.5 mEq/L (ref 3.5–5.1)
Sodium: 136 mEq/L (ref 135–145)
TCO2: 28 mmol/L (ref 0–100)

## 2011-01-26 LAB — POCT HEMOGLOBIN-HEMACUE: Hemoglobin: 12.1 g/dL (ref 12.0–15.0)

## 2011-02-01 ENCOUNTER — Encounter: Payer: Self-pay | Admitting: Pulmonary Disease

## 2011-02-01 ENCOUNTER — Ambulatory Visit (INDEPENDENT_AMBULATORY_CARE_PROVIDER_SITE_OTHER): Payer: BC Managed Care – PPO | Admitting: Pulmonary Disease

## 2011-02-01 ENCOUNTER — Other Ambulatory Visit: Payer: Self-pay | Admitting: Internal Medicine

## 2011-02-01 VITALS — BP 124/72 | HR 88 | Temp 98.2°F | Ht 66.0 in | Wt 242.8 lb

## 2011-02-01 DIAGNOSIS — J019 Acute sinusitis, unspecified: Secondary | ICD-10-CM

## 2011-02-01 MED ORDER — CEFDINIR 300 MG PO CAPS: 300.0000 mg | ORAL_CAPSULE | Freq: Two times a day (BID) | ORAL | Status: AC

## 2011-02-01 NOTE — Assessment & Plan Note (Signed)
The pt's history is most c/w acute sinobronchitis.  She will need a course of abx, as well as aggressive nasal hygiene regimen.  She is already on chronic prednisone at moderate dose, and I am hesitant to increase further.  I suspect she will do fine with abx alone at this point, but she is to call if not improving

## 2011-02-01 NOTE — Patient Instructions (Signed)
Will treat with omnicef 300mg  2 each am for 7 days mucinex dm extra strength  One am and pm until better Nasal saline spray am and pm to clean out nose Keep followup apptm with Dr. Delford Field.

## 2011-02-01 NOTE — Progress Notes (Signed)
  Subjective:    Patient ID: [name], female    DOB: 1961/01/24, 50 y.o.   MRN: [id number]  HPI The pt comes in today for an acute sick visit.  She has pulmonary sarcoidosis, and currently is on humira with moderate doses of prednisone under the direction of rheum.  She gives a 4 day h/o worsening sinus congestion, purulence from nares, ear discomfort, and now chest congestion with cough productive of green mucus at times.  Some increased sob.  Denies f/c/s.     Review of Systems  Constitutional: Negative for fever and unexpected weight change.  HENT: Positive for ear pain, congestion, sore throat and sinus pressure. Negative for nosebleeds, rhinorrhea, sneezing, trouble swallowing, dental problem and postnasal drip.   Eyes: Negative for redness and itching.  Respiratory: Positive for cough, chest tightness, shortness of breath and wheezing.   Cardiovascular: Negative for palpitations and leg swelling.  Gastrointestinal: Negative for nausea and vomiting.  Genitourinary: Negative for dysuria.  Musculoskeletal: Negative for joint swelling.  Skin: Negative for rash.  Neurological: Positive for headaches.  Hematological: Does not bruise/bleed easily.  Psychiatric/Behavioral: Negative for dysphoric mood. The patient is not nervous/anxious.        Objective:   Physical Exam Obese female in nad Nares with purulence bilat and erythematous mucosa OP with no exudates or lesions seen Chest fairly clear to auscultation.  No wheezing Cor with rrr LE with mild edema, no cyanosis  Alert and oriented, moves all 4        Assessment & Plan:

## 2011-02-02 ENCOUNTER — Encounter: Payer: Self-pay | Admitting: Internal Medicine

## 2011-02-07 ENCOUNTER — Emergency Department (HOSPITAL_COMMUNITY)
Admission: EM | Admit: 2011-02-07 | Discharge: 2011-02-07 | Disposition: A | Discharge status: Home / Self Care | Payer: Medicare Other | Attending: Emergency Medicine | Admitting: Emergency Medicine

## 2011-02-07 ENCOUNTER — Emergency Department (HOSPITAL_COMMUNITY): Payer: Medicare Other

## 2011-02-07 DIAGNOSIS — H9209 Otalgia, unspecified ear: Secondary | ICD-10-CM | POA: Insufficient documentation

## 2011-02-07 DIAGNOSIS — I10 Essential (primary) hypertension: Secondary | ICD-10-CM | POA: Insufficient documentation

## 2011-02-07 DIAGNOSIS — R059 Cough, unspecified: Secondary | ICD-10-CM | POA: Insufficient documentation

## 2011-02-07 DIAGNOSIS — J3489 Other specified disorders of nose and nasal sinuses: Secondary | ICD-10-CM | POA: Insufficient documentation

## 2011-02-07 DIAGNOSIS — K219 Gastro-esophageal reflux disease without esophagitis: Secondary | ICD-10-CM | POA: Insufficient documentation

## 2011-02-07 DIAGNOSIS — E119 Type 2 diabetes mellitus without complications: Secondary | ICD-10-CM | POA: Insufficient documentation

## 2011-02-07 DIAGNOSIS — E785 Hyperlipidemia, unspecified: Secondary | ICD-10-CM | POA: Insufficient documentation

## 2011-02-07 DIAGNOSIS — R0982 Postnasal drip: Secondary | ICD-10-CM | POA: Insufficient documentation

## 2011-02-07 DIAGNOSIS — R05 Cough: Secondary | ICD-10-CM | POA: Insufficient documentation

## 2011-02-07 DIAGNOSIS — J40 Bronchitis, not specified as acute or chronic: Secondary | ICD-10-CM | POA: Insufficient documentation

## 2011-02-07 LAB — CBC
HCT: 36.1 % (ref 36.0–46.0)
Hemoglobin: 12.5 g/dL (ref 12.0–15.0)
MCH: 31.4 pg (ref 26.0–34.0)
MCHC: 34.6 g/dL (ref 30.0–36.0)
MCV: 90.7 fL (ref 78.0–100.0)
Platelets: 266 10*3/uL (ref 150–400)
RBC: 3.98 MIL/uL (ref 3.87–5.11)
RDW: 12.7 % (ref 11.5–15.5)
WBC: 15.2 10*3/uL — ABNORMAL HIGH (ref 4.0–10.5)

## 2011-02-07 LAB — DIFFERENTIAL
Basophils Absolute: 0.1 10*3/uL (ref 0.0–0.1)
Basophils Relative: 0 % (ref 0–1)
Eosinophils Absolute: 0.3 10*3/uL (ref 0.0–0.7)
Eosinophils Relative: 2 % (ref 0–5)
Lymphocytes Relative: 22 % (ref 12–46)
Lymphs Abs: 3.4 10*3/uL (ref 0.7–4.0)
Monocytes Absolute: 1 10*3/uL (ref 0.1–1.0)
Monocytes Relative: 6 % (ref 3–12)
Neutro Abs: 10.5 10*3/uL — ABNORMAL HIGH (ref 1.7–7.7)
Neutrophils Relative %: 69 % (ref 43–77)

## 2011-02-07 LAB — BASIC METABOLIC PANEL
BUN: 10 mg/dL (ref 6–23)
CO2: 27 mEq/L (ref 19–32)
Calcium: 8.4 mg/dL (ref 8.4–10.5)
Chloride: 94 mEq/L — ABNORMAL LOW (ref 96–112)
Creatinine, Ser: 0.56 mg/dL (ref 0.4–1.2)
GFR calc Af Amer: >60 mL/min (ref >60)
GFR calc non Af Amer: >60 mL/min (ref >60)
Glucose, Bld: 236 mg/dL — ABNORMAL HIGH (ref 70–99)
Potassium: 2.9 mEq/L — ABNORMAL LOW (ref 3.5–5.1)
Sodium: 132 mEq/L — ABNORMAL LOW (ref 135–145)

## 2011-02-10 ENCOUNTER — Encounter: Payer: Self-pay | Admitting: Critical Care Medicine

## 2011-02-15 ENCOUNTER — Other Ambulatory Visit (INDEPENDENT_AMBULATORY_CARE_PROVIDER_SITE_OTHER): Payer: BC Managed Care – PPO

## 2011-02-15 ENCOUNTER — Encounter: Payer: Self-pay | Admitting: Critical Care Medicine

## 2011-02-15 ENCOUNTER — Other Ambulatory Visit: Payer: Self-pay | Admitting: Internal Medicine

## 2011-02-15 ENCOUNTER — Ambulatory Visit (INDEPENDENT_AMBULATORY_CARE_PROVIDER_SITE_OTHER): Payer: BC Managed Care – PPO | Admitting: Critical Care Medicine

## 2011-02-15 VITALS — BP 120/70 | HR 83 | Temp 98.2°F | Ht 66.0 in | Wt 239.8 lb

## 2011-02-15 DIAGNOSIS — E876 Hypokalemia: Secondary | ICD-10-CM

## 2011-02-15 DIAGNOSIS — D869 Sarcoidosis, unspecified: Secondary | ICD-10-CM

## 2011-02-15 DIAGNOSIS — J99 Respiratory disorders in diseases classified elsewhere: Secondary | ICD-10-CM

## 2011-02-15 DIAGNOSIS — D860 Sarcoidosis of lung: Secondary | ICD-10-CM

## 2011-02-15 LAB — BASIC METABOLIC PANEL
BUN: 11 mg/dL (ref 6–23)
CO2: 32 mEq/L (ref 19–32)
Calcium: 9.2 mg/dL (ref 8.4–10.5)
Chloride: 93 mEq/L — ABNORMAL LOW (ref 96–112)
Creatinine, Ser: 0.6 mg/dL (ref 0.4–1.2)
GFR: 105.66 mL/min (ref >60.00)
Glucose, Bld: 264 mg/dL — ABNORMAL HIGH (ref 70–99)
Potassium: 3.6 mEq/L (ref 3.5–5.1)
Sodium: 136 mEq/L (ref 135–145)

## 2011-02-15 NOTE — Patient Instructions (Signed)
Labs today, will call with results No change in medications. Return in       2 months

## 2011-02-15 NOTE — Progress Notes (Signed)
Subjective:    Patient ID: [name], female    DOB: [birth year], 50 y.o.   MRN: [id number]  HPI 50 y.o. WF with Sarcoid, GERD, VCD, cyclical cough 02/15/2011 Pt was seen 4/23 by [facility] for acute OV. Pt then went to ED 4/29 for same: cough and dyspnea. Rx was Abx Cefdinir. and no change in pred.  Pt got worse and went to ED .  K2.9 WBC 15K  CXR thickened airways.  Rx ventolin, pred, Kcl, Zpak. Since ED, cough is clearer, mucus was green.  No fever.  Still with heartburn.  Noted some chest pain . Remecade stopped, not helping as much and now on Humira.  Takes this every two weeks  Review of Systems  Constitutional: Negative for fever and unexpected weight change.  HENT: Negative for ear pain, nosebleeds, congestion, sore throat, rhinorrhea, sneezing, trouble swallowing, dental problem, postnasal drip and sinus pressure.   Eyes: Negative for redness and itching.  Respiratory: Positive for cough. Negative for chest tightness, shortness of breath and wheezing.        Cough is improved   Cardiovascular: Negative for palpitations and leg swelling.  Gastrointestinal: Negative for nausea and vomiting.  Genitourinary: Negative for dysuria.  Musculoskeletal: Negative for joint swelling.  Skin: Negative for rash.  Neurological: Negative for headaches.  Hematological: Does not bruise/bleed easily.  Psychiatric/Behavioral: Negative for dysphoric mood. The patient is not nervous/anxious.        Objective:   Physical Exam Obese female in nad Filed Vitals:   02/15/11 1053  BP: 120/70  Pulse: 83  Temp: 98.2 F (36.8 C)  TempSrc: Oral  Height: 5\' 6"  (1.676 m)  Weight: 239 lb 12.8 oz (108.773 kg)  SpO2: 99%    Gen: Pleasant, obese  in no distress,  Flat  affect  ENT: No lesions,  mouth clear,  oropharynx clear, no postnasal drip  Neck: No JVD, no TMG, no carotid bruits  Lungs: No use of accessory muscles, no dullness to percussion, clear without rales or rhonchi  Cardiovascular: RRR,  heart sounds normal, no murmur or gallops, no peripheral edema  Abdomen: soft and NT, no HSM,  BS normal  Musculoskeletal: No deformities, no cyanosis or clubbing  Neuro: alert, non focal  Skin: Warm, no lesions or rashes    Assessment & Plan:   Sarcoidosis of lung Stable Sarcoidosis, now on humira as remicade stopped. Cyclical cough due to upper airway instability syndrome, post nasal drip, GERD, vocal cord dysfunction.  Doubt true asthma. Recent ED visits.   Plan Recheck K levels>>>normal  No change in medications. Return in              4 months    Updated Medication List Outpatient Encounter Prescriptions as of 02/15/2011  Medication Sig Dispense Refill  . Adalimumab (HUMIRA PEN [place]) Injection every 2 weeks.       [name] atenolol-chlorthalidone (TENORETIC) 50-25 MG per tablet Take 1 tablet by mouth daily.        [name] atorvastatin (LIPITOR) 40 MG tablet Take 40 mg by mouth daily.        . benzonatate (TESSALON) 100 MG capsule Take 100 mg by mouth 3 (three) times daily as needed.        [name] buPROPion (WELLBUTRIN XL) 300 MG 24 hr tablet Take 300 mg by mouth daily.        . ciclesonide (OMNARIS) 50 MCG/ACT nasal spray 2 sprays by Each Nare route daily.        [name]  cloNIDine (CATAPRES) 0.1 MG tablet Once daily      . cycloSPORINE (RESTASIS) 0.05 % ophthalmic emulsion Place 1 drop into both eyes daily.        [name] Dexlansoprazole 60 MG capsule Take 60 mg by mouth daily.        [name] estradiol (ESTRACE) 1 MG tablet Take 2 mg by mouth daily.       . furosemide (LASIX) 20 MG tablet Take 20 mg by mouth 2 (two) times daily.        . Hydrocod Polst-Chlorphen Polst (TUSSICAPS) 10-8 MG CP12 Take 8 mg by mouth 2 (two) times daily as needed. For cough       . HYDROcodone-acetaminophen (NORCO) 5-325 MG per tablet Take 2 tablets by mouth every 6 (six) hours as needed for pain.  20 tablet  0  . hyoscyamine (LEVBID) 0.375 MG 12 hr tablet Take 0.375 mg by mouth 2 (two) times daily.        [name] LORazepam (ATIVAN) 1 MG  tablet Take 1 mg by mouth every 6 (six) hours as needed.        . loteprednol (LOTEMAX) 0.5 % ophthalmic suspension Place 1 drop into both eyes every 6 (six) hours.        [name] nystatin-triamcinolone (MYCOLOG II) cream Apply 100,000 g topically 2 (two) times daily as needed.        . predniSONE (DELTASONE) 5 MG tablet Take 10 mg by mouth daily.       . simvastatin (ZOCOR) 40 MG tablet Take 40 mg by mouth at bedtime.        . traMADol (ULTRAM) 50 MG tablet Take 50 mg by mouth every 6 (six) hours as needed.        . venlafaxine (EFFEXOR-XR) 75 MG 24 hr capsule TAKE ONE CAPSULE BY MOUTH TWICE DAILY  60 capsule  2  . DISCONTD: potassium chloride SA (K-DUR,KLOR-CON) 20 MEQ tablet TAKE ONE TABLET BY MOUTH THREE TIMES DAILY  30 tablet  2  . venlafaxine (EFFEXOR) 75 MG tablet Take 75 mg by mouth 2 (two) times daily.

## 2011-02-16 NOTE — Assessment & Plan Note (Signed)
Stable Sarcoidosis, now on humira as remicade stopped. Cyclical cough due to upper airway instability syndrome, post nasal drip, GERD, vocal cord dysfunction.  Doubt true asthma. Recent ED visits.   Plan Recheck K levels>>>normal  No change in medications. Return in              4 months

## 2011-02-17 ENCOUNTER — Emergency Department (EMERGENCY_DEPARTMENT_HOSPITAL): Payer: BC Managed Care – PPO

## 2011-02-17 ENCOUNTER — Ambulatory Visit: Payer: BC Managed Care – PPO | Admitting: Family Medicine

## 2011-02-17 ENCOUNTER — Inpatient Hospital Stay (HOSPITAL_COMMUNITY): Payer: BC Managed Care – PPO

## 2011-02-17 ENCOUNTER — Emergency Department (INDEPENDENT_AMBULATORY_CARE_PROVIDER_SITE_OTHER): Payer: BC Managed Care – PPO

## 2011-02-17 ENCOUNTER — Emergency Department (HOSPITAL_BASED_OUTPATIENT_CLINIC_OR_DEPARTMENT_OTHER): Payer: BC Managed Care – PPO

## 2011-02-17 ENCOUNTER — Observation Stay (HOSPITAL_COMMUNITY)
Admission: AD | Admit: 2011-02-17 | Discharge: 2011-02-21 | DRG: 832 | Disposition: A | Discharge status: Home / Self Care | Payer: BC Managed Care – PPO | Source: Other Acute Inpatient Hospital | Attending: Internal Medicine | Admitting: Internal Medicine

## 2011-02-17 ENCOUNTER — Emergency Department (HOSPITAL_BASED_OUTPATIENT_CLINIC_OR_DEPARTMENT_OTHER)
Admission: EM | Admit: 2011-02-17 | Discharge: 2011-02-17 | Disposition: A | Discharge status: Other Acute Inpatient Hospital | Payer: BC Managed Care – PPO | Source: Home / Self Care | Attending: Emergency Medicine | Admitting: Emergency Medicine

## 2011-02-17 DIAGNOSIS — R109 Unspecified abdominal pain: Secondary | ICD-10-CM | POA: Insufficient documentation

## 2011-02-17 DIAGNOSIS — K219 Gastro-esophageal reflux disease without esophagitis: Secondary | ICD-10-CM | POA: Insufficient documentation

## 2011-02-17 DIAGNOSIS — Z79899 Other long term (current) drug therapy: Secondary | ICD-10-CM | POA: Insufficient documentation

## 2011-02-17 DIAGNOSIS — K5900 Constipation, unspecified: Secondary | ICD-10-CM | POA: Insufficient documentation

## 2011-02-17 DIAGNOSIS — I10 Essential (primary) hypertension: Secondary | ICD-10-CM | POA: Insufficient documentation

## 2011-02-17 DIAGNOSIS — E669 Obesity, unspecified: Secondary | ICD-10-CM | POA: Insufficient documentation

## 2011-02-17 DIAGNOSIS — G459 Transient cerebral ischemic attack, unspecified: Secondary | ICD-10-CM | POA: Insufficient documentation

## 2011-02-17 DIAGNOSIS — D869 Sarcoidosis, unspecified: Secondary | ICD-10-CM | POA: Insufficient documentation

## 2011-02-17 DIAGNOSIS — R51 Headache: Secondary | ICD-10-CM

## 2011-02-17 DIAGNOSIS — R079 Chest pain, unspecified: Secondary | ICD-10-CM | POA: Insufficient documentation

## 2011-02-17 DIAGNOSIS — Z7983 Long term (current) use of bisphosphonates: Secondary | ICD-10-CM | POA: Insufficient documentation

## 2011-02-17 DIAGNOSIS — IMO0001 Reserved for inherently not codable concepts without codable children: Secondary | ICD-10-CM | POA: Insufficient documentation

## 2011-02-17 DIAGNOSIS — F29 Unspecified psychosis not due to a substance or known physiological condition: Secondary | ICD-10-CM | POA: Insufficient documentation

## 2011-02-17 DIAGNOSIS — E876 Hypokalemia: Secondary | ICD-10-CM | POA: Insufficient documentation

## 2011-02-17 DIAGNOSIS — R4789 Other speech disturbances: Secondary | ICD-10-CM | POA: Insufficient documentation

## 2011-02-17 DIAGNOSIS — E785 Hyperlipidemia, unspecified: Secondary | ICD-10-CM | POA: Insufficient documentation

## 2011-02-17 DIAGNOSIS — E871 Hypo-osmolality and hyponatremia: Secondary | ICD-10-CM | POA: Insufficient documentation

## 2011-02-17 DIAGNOSIS — E119 Type 2 diabetes mellitus without complications: Secondary | ICD-10-CM | POA: Insufficient documentation

## 2011-02-17 DIAGNOSIS — R110 Nausea: Secondary | ICD-10-CM | POA: Insufficient documentation

## 2011-02-17 LAB — DIFFERENTIAL
Basophils Absolute: 0 10*3/uL (ref 0.0–0.1)
Basophils Relative: 0 % (ref 0–1)
Eosinophils Absolute: 0.2 10*3/uL (ref 0.0–0.7)
Eosinophils Relative: 2 % (ref 0–5)
Lymphocytes Relative: 27 % (ref 12–46)
Lymphs Abs: 3.1 10*3/uL (ref 0.7–4.0)
Monocytes Absolute: 0.8 10*3/uL (ref 0.1–1.0)
Monocytes Relative: 7 % (ref 3–12)
Neutro Abs: 7.2 10*3/uL (ref 1.7–7.7)
Neutrophils Relative %: 64 % (ref 43–77)

## 2011-02-17 LAB — BASIC METABOLIC PANEL
BUN: 11 mg/dL (ref 6–23)
BUN: 9 mg/dL (ref 6–23)
CO2: 29 mEq/L (ref 19–32)
CO2: 29 mEq/L (ref 19–32)
Calcium: 8.3 mg/dL — ABNORMAL LOW (ref 8.4–10.5)
Calcium: 7.8 mg/dL — ABNORMAL LOW (ref 8.4–10.5)
Chloride: 90 mEq/L — ABNORMAL LOW (ref 96–112)
Chloride: 94 mEq/L — ABNORMAL LOW (ref 96–112)
Creatinine, Ser: <0.47 mg/dL (ref 0.4–1.2)
Creatinine, Ser: <0.47 mg/dL (ref 0.4–1.2)
Glucose, Bld: 194 mg/dL — ABNORMAL HIGH (ref 70–99)
Glucose, Bld: 161 mg/dL — ABNORMAL HIGH (ref 70–99)
Potassium: 2.4 mEq/L — CL (ref 3.5–5.1)
Potassium: 2.9 mEq/L — ABNORMAL LOW (ref 3.5–5.1)
Sodium: 136 mEq/L (ref 135–145)
Sodium: 138 mEq/L (ref 135–145)

## 2011-02-17 LAB — CK TOTAL AND CKMB (NOT AT ARMC)
CK, MB: 1.4 ng/mL (ref 0.3–4.0)
Relative Index: INVALID (ref 0.0–2.5)
Total CK: 38 U/L (ref 7–177)

## 2011-02-17 LAB — GLUCOSE, CAPILLARY: Glucose-Capillary: 225 mg/dL — ABNORMAL HIGH (ref 70–99)

## 2011-02-17 LAB — CBC
HCT: 36.5 % (ref 36.0–46.0)
Hemoglobin: 12.8 g/dL (ref 12.0–15.0)
MCH: 31 pg (ref 26.0–34.0)
MCHC: 35.1 g/dL (ref 30.0–36.0)
MCV: 88.4 fL (ref 78.0–100.0)
Platelets: 220 10*3/uL (ref 150–400)
RBC: 4.13 MIL/uL (ref 3.87–5.11)
RDW: 12.4 % (ref 11.5–15.5)
WBC: 11.3 10*3/uL — ABNORMAL HIGH (ref 4.0–10.5)

## 2011-02-17 LAB — HEPATIC FUNCTION PANEL
ALT: 23 U/L (ref 0–35)
AST: 37 U/L (ref 0–37)
Albumin: 3.9 g/dL (ref 3.5–5.2)
Alkaline Phosphatase: 93 U/L (ref 39–117)
Bilirubin, Direct: 0.2 mg/dL (ref 0.0–0.3)
Indirect Bilirubin: 0.5 mg/dL (ref 0.3–0.9)
Total Bilirubin: 0.7 mg/dL (ref 0.3–1.2)
Total Protein: 6.8 g/dL (ref 6.0–8.3)

## 2011-02-17 LAB — PROTIME-INR
INR: 1.04 (ref 0.00–1.49)
Prothrombin Time: 13.8 seconds (ref 11.6–15.2)

## 2011-02-17 LAB — APTT: aPTT: 28 seconds (ref 24–37)

## 2011-02-17 LAB — TROPONIN I: Troponin I: <0.3 ng/mL (ref <0.30)

## 2011-02-18 ENCOUNTER — Inpatient Hospital Stay (HOSPITAL_COMMUNITY): Payer: BC Managed Care – PPO

## 2011-02-18 DIAGNOSIS — M79609 Pain in unspecified limb: Secondary | ICD-10-CM

## 2011-02-18 DIAGNOSIS — G459 Transient cerebral ischemic attack, unspecified: Secondary | ICD-10-CM

## 2011-02-18 HISTORY — PX: OTHER SURGICAL HISTORY: SHX169

## 2011-02-18 LAB — URINALYSIS, ROUTINE W REFLEX MICROSCOPIC
Bilirubin Urine: NEGATIVE
Glucose, UA: NEGATIVE mg/dL
Hgb urine dipstick: NEGATIVE
Ketones, ur: NEGATIVE mg/dL
Nitrite: NEGATIVE
Protein, ur: NEGATIVE mg/dL
Specific Gravity, Urine: 1.006 (ref 1.005–1.030)
Urobilinogen, UA: 0.2 mg/dL (ref 0.0–1.0)
pH: 7 (ref 5.0–8.0)

## 2011-02-18 LAB — LIPID PANEL
Cholesterol: 135 mg/dL (ref 0–200)
HDL: 27 mg/dL — ABNORMAL LOW (ref >39)
LDL Cholesterol: UNDETERMINED mg/dL (ref 0–99)
Total CHOL/HDL Ratio: 5 RATIO
Triglycerides: 562 mg/dL — ABNORMAL HIGH (ref <150)
VLDL: UNDETERMINED mg/dL (ref 0–40)

## 2011-02-18 LAB — COMPREHENSIVE METABOLIC PANEL
ALT: 25 U/L (ref 0–35)
AST: 39 U/L — ABNORMAL HIGH (ref 0–37)
Albumin: 3.2 g/dL — ABNORMAL LOW (ref 3.5–5.2)
Alkaline Phosphatase: 89 U/L (ref 39–117)
BUN: 11 mg/dL (ref 6–23)
CO2: 31 mEq/L (ref 19–32)
Calcium: 7.7 mg/dL — ABNORMAL LOW (ref 8.4–10.5)
Chloride: 95 mEq/L — ABNORMAL LOW (ref 96–112)
Creatinine, Ser: 0.48 mg/dL (ref 0.4–1.2)
GFR calc Af Amer: >60 mL/min (ref >60)
GFR calc non Af Amer: >60 mL/min (ref >60)
Glucose, Bld: 233 mg/dL — ABNORMAL HIGH (ref 70–99)
Potassium: 2.8 mEq/L — ABNORMAL LOW (ref 3.5–5.1)
Sodium: 136 mEq/L (ref 135–145)
Total Bilirubin: 0.5 mg/dL (ref 0.3–1.2)
Total Protein: 5.8 g/dL — ABNORMAL LOW (ref 6.0–8.3)

## 2011-02-18 LAB — BASIC METABOLIC PANEL
BUN: 10 mg/dL (ref 6–23)
CO2: 31 mEq/L (ref 19–32)
Calcium: 7.9 mg/dL — ABNORMAL LOW (ref 8.4–10.5)
Chloride: 98 mEq/L (ref 96–112)
Creatinine, Ser: <0.47 mg/dL (ref 0.4–1.2)
Glucose, Bld: 176 mg/dL — ABNORMAL HIGH (ref 70–99)
Potassium: 3.5 mEq/L (ref 3.5–5.1)
Sodium: 139 mEq/L (ref 135–145)

## 2011-02-18 LAB — CBC
HCT: 35 % — ABNORMAL LOW (ref 36.0–46.0)
Hemoglobin: 11.6 g/dL — ABNORMAL LOW (ref 12.0–15.0)
MCH: 30.3 pg (ref 26.0–34.0)
MCHC: 33.1 g/dL (ref 30.0–36.0)
MCV: 91.4 fL (ref 78.0–100.0)
Platelets: 207 10*3/uL (ref 150–400)
RBC: 3.83 MIL/uL — ABNORMAL LOW (ref 3.87–5.11)
RDW: 12.6 % (ref 11.5–15.5)
WBC: 7.3 10*3/uL (ref 4.0–10.5)

## 2011-02-18 LAB — GLUCOSE, CAPILLARY
Glucose-Capillary: 176 mg/dL — ABNORMAL HIGH (ref 70–99)
Glucose-Capillary: 205 mg/dL — ABNORMAL HIGH (ref 70–99)
Glucose-Capillary: 172 mg/dL — ABNORMAL HIGH (ref 70–99)

## 2011-02-18 LAB — AMYLASE: Amylase: 45 U/L (ref 0–105)

## 2011-02-18 LAB — HEMOGLOBIN A1C
Hgb A1c MFr Bld: 8.6 % — ABNORMAL HIGH (ref <5.7)
Mean Plasma Glucose: 200 mg/dL — ABNORMAL HIGH (ref <117)

## 2011-02-18 LAB — LIPASE, BLOOD: Lipase: 18 U/L (ref 11–59)

## 2011-02-18 MED ORDER — IOHEXOL 300 MG/ML  SOLN
100.0000 mL | Freq: Once | INTRAMUSCULAR | Status: AC | PRN
  Administered 2011-02-18: 100 mL via INTRAVENOUS

## 2011-02-19 ENCOUNTER — Inpatient Hospital Stay (HOSPITAL_COMMUNITY): Payer: BC Managed Care – PPO

## 2011-02-19 LAB — GLUCOSE, CAPILLARY
Glucose-Capillary: 236 mg/dL — ABNORMAL HIGH (ref 70–99)
Glucose-Capillary: 126 mg/dL — ABNORMAL HIGH (ref 70–99)
Glucose-Capillary: 169 mg/dL — ABNORMAL HIGH (ref 70–99)
Glucose-Capillary: 247 mg/dL — ABNORMAL HIGH (ref 70–99)

## 2011-02-19 LAB — COMPREHENSIVE METABOLIC PANEL
ALT: 29 U/L (ref 0–35)
AST: 38 U/L — ABNORMAL HIGH (ref 0–37)
Albumin: 3.3 g/dL — ABNORMAL LOW (ref 3.5–5.2)
Alkaline Phosphatase: 90 U/L (ref 39–117)
BUN: 7 mg/dL (ref 6–23)
CO2: 30 mEq/L (ref 19–32)
Calcium: 7.6 mg/dL — ABNORMAL LOW (ref 8.4–10.5)
Chloride: 102 mEq/L (ref 96–112)
Creatinine, Ser: 0.47 mg/dL (ref 0.4–1.2)
GFR calc Af Amer: >60 mL/min (ref >60)
GFR calc non Af Amer: >60 mL/min (ref >60)
Glucose, Bld: 130 mg/dL — ABNORMAL HIGH (ref 70–99)
Potassium: 3.7 mEq/L (ref 3.5–5.1)
Sodium: 141 mEq/L (ref 135–145)
Total Bilirubin: 0.4 mg/dL (ref 0.3–1.2)
Total Protein: 6 g/dL (ref 6.0–8.3)

## 2011-02-19 LAB — CBC
HCT: 36.5 % (ref 36.0–46.0)
Hemoglobin: 12.2 g/dL (ref 12.0–15.0)
MCH: 30.7 pg (ref 26.0–34.0)
MCHC: 33.4 g/dL (ref 30.0–36.0)
MCV: 91.7 fL (ref 78.0–100.0)
Platelets: 243 10*3/uL (ref 150–400)
RBC: 3.98 MIL/uL (ref 3.87–5.11)
RDW: 12.7 % (ref 11.5–15.5)
WBC: 9.8 10*3/uL (ref 4.0–10.5)

## 2011-02-19 LAB — CARDIAC PANEL(CRET KIN+CKTOT+MB+TROPI)
CK, MB: 1.5 ng/mL (ref 0.3–4.0)
CK, MB: 1.5 ng/mL (ref 0.3–4.0)
Relative Index: INVALID (ref 0.0–2.5)
Relative Index: INVALID (ref 0.0–2.5)
Total CK: 50 U/L (ref 7–177)
Total CK: 47 U/L (ref 7–177)
Troponin I: <0.3 ng/mL (ref <0.30)
Troponin I: <0.3 ng/mL (ref <0.30)

## 2011-02-19 LAB — CORTISOL: Cortisol, Plasma: 1.7 ug/dL

## 2011-02-19 LAB — LIPASE, BLOOD: Lipase: 19 U/L (ref 11–59)

## 2011-02-19 LAB — AMYLASE: Amylase: 23 U/L (ref 0–105)

## 2011-02-20 LAB — CARDIAC PANEL(CRET KIN+CKTOT+MB+TROPI)
CK, MB: 1.3 ng/mL (ref 0.3–4.0)
CK, MB: 1.4 ng/mL (ref 0.3–4.0)
Relative Index: INVALID (ref 0.0–2.5)
Relative Index: INVALID (ref 0.0–2.5)
Total CK: 41 U/L (ref 7–177)
Total CK: 54 U/L (ref 7–177)
Troponin I: <0.3 ng/mL (ref <0.30)
Troponin I: <0.3 ng/mL (ref <0.30)

## 2011-02-20 LAB — GLUCOSE, CAPILLARY
Glucose-Capillary: 153 mg/dL — ABNORMAL HIGH (ref 70–99)
Glucose-Capillary: 171 mg/dL — ABNORMAL HIGH (ref 70–99)
Glucose-Capillary: 252 mg/dL — ABNORMAL HIGH (ref 70–99)
Glucose-Capillary: 213 mg/dL — ABNORMAL HIGH (ref 70–99)
Glucose-Capillary: 215 mg/dL — ABNORMAL HIGH (ref 70–99)
Glucose-Capillary: 130 mg/dL — ABNORMAL HIGH (ref 70–99)
Glucose-Capillary: 170 mg/dL — ABNORMAL HIGH (ref 70–99)

## 2011-02-20 LAB — BASIC METABOLIC PANEL
BUN: 7 mg/dL (ref 6–23)
CO2: 30 mEq/L (ref 19–32)
Calcium: 8.1 mg/dL — ABNORMAL LOW (ref 8.4–10.5)
Chloride: 98 mEq/L (ref 96–112)
Creatinine, Ser: 0.49 mg/dL (ref 0.4–1.2)
GFR calc Af Amer: >60 mL/min (ref >60)
GFR calc non Af Amer: >60 mL/min (ref >60)
Glucose, Bld: 174 mg/dL — ABNORMAL HIGH (ref 70–99)
Potassium: 4 mEq/L (ref 3.5–5.1)
Sodium: 136 mEq/L (ref 135–145)

## 2011-02-20 LAB — CORTISOL
Cortisol, Plasma: 1.3 ug/dL
Cortisol, Plasma: 20.7 ug/dL

## 2011-02-21 LAB — GLUCOSE, CAPILLARY
Glucose-Capillary: 137 mg/dL — ABNORMAL HIGH (ref 70–99)
Glucose-Capillary: 164 mg/dL — ABNORMAL HIGH (ref 70–99)
Glucose-Capillary: 157 mg/dL — ABNORMAL HIGH (ref 70–99)

## 2011-02-21 LAB — BASIC METABOLIC PANEL
BUN: 8 mg/dL (ref 6–23)
CO2: 32 mEq/L (ref 19–32)
Calcium: 8.9 mg/dL (ref 8.4–10.5)
Chloride: 98 mEq/L (ref 96–112)
Creatinine, Ser: 0.5 mg/dL (ref 0.4–1.2)
GFR calc Af Amer: >60 mL/min (ref >60)
GFR calc non Af Amer: >60 mL/min (ref >60)
Glucose, Bld: 159 mg/dL — ABNORMAL HIGH (ref 70–99)
Potassium: 4.3 mEq/L (ref 3.5–5.1)
Sodium: 137 mEq/L (ref 135–145)

## 2011-02-23 NOTE — Assessment & Plan Note (Signed)
[facility]                             PULMONARY OFFICE NOTE   NAME:[name], [name] ROBARTS                     MRN:          [id number]  DATE:03/30/2007                            DOB:          1961-04-11    [name] is a 50 year old white female with history of vocal cord  dysfunction syndrome, allergic rhinitis, mediastinal lymphadenopathy due  to sarcoidosis, positive biopsy in September 2007.  The patient is  having increased chest discomfort for the last 2 weeks, noting some  knots on the arms, no cough, having increased shortness of breath with  exertion.  She is on prednisone 50 mg a day.  Many of her medicines have  been stopped, including the benzonatate, HCTZ, metoclopramide, Vytorin,  Brovex, and Veramyst.  She is on the Qvar 80 mcg strength 2 sprays  b.i.d.   PHYSICAL EXAMINATION:  VITAL SIGNS:  Temperature is 98, blood pressure  134/82, pulse 92, saturation 95% in room air.  CHEST:  Showed distant breath sounds.  No wheeze or rhonchi.  CARDIAC:  Showed a regular rate and rhythm, without S3.  Normal S1, S2.  ABDOMEN:  Soft, nontender.  EXTREMITIES:  Showed no edema or clubbing.  SKIN:  Showed nodular changes in both antecubital areas in the upper  arms.  HEENT:  Oropharynx was clear.  NECK:  Supple.   IMPRESSION:  Sarcoidosis with flare.  She is due to have a CT scan of  the chest in the next 24 hours.  Plan in the interim is to increase  prednisone to 40 mg a day, taper slowly down to 20 mg a day and hold,  and will follow up the results of the CT scan.     Charlcie Cradle Delford Field, MD, [facility]  Electronically Signed    PEW/MedQ  DD: 03/30/2007  DT: 03/31/2007  Job #: [id number]

## 2011-02-23 NOTE — Discharge Summary (Signed)
[name], [name]              ACCOUNT NO.:  [id number]   MEDICAL RECORD NO.:  [id number]          PATIENT TYPE:  INP   LOCATION:  5504                         FACILITY:  [facility]   PHYSICIAN:  Oretha Milch, MD      DATE OF BIRTH:  [birth year]   DATE OF ADMISSION:  04/27/2009  DATE OF DISCHARGE:  04/30/2009                               DISCHARGE SUMMARY   DISCHARGE DIAGNOSES:  1. Sarcoidosis with acute exacerbation.  2. Diabetes.  3. Lactic acidosis.   HISTORY OF PRESENT ILLNESS:  Please see complete history and physical  dictated by Dr. Jetty Duhamel on the day of admission.  [name] is  a pleasant 50 year old woman followed by Dr. Delford Field for Pulmonary who  presented to the emergency room on July 18 with chief complaint of  increased shortness of breath and cough.  She has had increased symptoms  of variable cough, sinus pressure, postnasal drip, and shortness of  breath intermittently over the last few months which became worse over  the last few days prior to admission.  Pulmonary was called to admit the  patient from the emergency room at this time.   LABORATORY DATA:  On the date of admission, July 18, D-dimer 0.26.  PT  13.3 and INR 1.0.  Complete metabolic panel; sodium 135, potassium 3.9,  glucose 166, BUN 9, and creatinine 0.66.  CBC; white blood cells 15.8,  hemoglobin 12.4, hematocrit 35.6, and platelets 312.  Urinalysis was  negative.  Blood gases at the time of admission; pH 7.66, PCO2 of 20.7,  PO2 of 103, and bicarb 23.5.  Lactic acid was 4.8 and reached as high as  8.0 on July 19.  Lipase was 12 and amylase 27.  Most recent laboratory  data from July 21, basic metabolic panel; sodium 139, potassium 4,  glucose 169, BUN 17, and creatinine 0.75.  White blood cells 13.8,  hemoglobin 11.8, hematocrit 34.2, and platelets 298.   MICRO DATA:  Respiratory culture from July 19 shows normal oropharyngeal  flora.  Blood cultures x2 drawn on July 19, preliminary report  is no  growth to date.   RADIOLOGY DATA:  At the time of admission, portable chest showed  slightly low-lung volumes with bibasilar atelectasis.  A CT of the chest  was negative for pulmonary embolism and no acute intrathoracic  abnormalities   HOSPITAL COURSE BY DISCHARGE DIAGNOSES:  1. Sarcoidosis with acute exacerbation.  This is much improved.  The      patient continued to get bronchodilators as well as her home      medication regimen.  At the time of discharge, her dyspnea is much      improved.  Leukocytosis has improved and culture data negative so      far.  The patient did not require antibiotics during this admission      and had subsequent resolution or improvement rather in      leukocytosis.  However, given that she is on long-term steroids as      well as Remicade injections every 6 weeks, it is hard to know  her      baseline white blood cell count would be.  CT of the chest was      negative for PE and showed no intrathoracic abnormalities.  The      patient at the time of discharge is back to her baseline      respiratory status and will follow up on an outpatient setting with      her pulmonologist, Dr. Delford Field.  2. Diabetes.  The patient has a known history of diabetes.  This was      treated as an inpatient with sliding scale insulin, and this will      be followed up on an outpatient basis with her primary care doctor,      Dr. Cato Mulligan.  3. Lactic acidosis.  This is improved.  Lactic acid level as high as 8      and at last check on July 20 was 2.6.  This is likely due to some      level to her hyperventilation at the time of admission and again,      this has resolved.   DISCHARGE MEDICATIONS:  1. Atenolol 50 mg p.o. daily.  2. Buspirone 30 mg p.o. daily.  3. Crestor 20 mg p.o. daily.  4. Effexor XR 75 mg p.o. daily.  5. Lasix 20 mg p.o. daily.  6. Iron 47.5 mg p.o. daily.  7. Prednisone 5 mg p.o. b.i.d.  8. Zegerid 40 mg p.o. b.i.d.  9. Potassium 20 mEq  p.o. b.i.d.  10.Ativan 1 mg p.o. t.i.d. p.r.n.  11.Tussionex 5 mL p.o. q.12 h p.r.n.  12.Benicar 40 mg p.o. daily.  13.Flonase 2 sprays each nostril daily.  14.Chlorthalidone 25 mg p.o. daily.  15.QVAR 80 mcg 2 puffs b.i.d.  16.Restasis eye drops 1 drop both eyes daily.  17.Diclofenac 50 mg p.o. b.i.d.  18.Estradiol 1 mg p.o. daily.   DISCHARGE DIET:  The patient is to be discharged on a heart-healthy,  carb-modified diet.   FOLLOWUP:  The patient is to follow up with primary care physician, Dr.  Cato Mulligan in the next 2-3 weeks.  She is also to follow up with Dr. Delford Field  in the Pulmonary office as previously scheduled during the month of  August.   DISPOSITION:  The patient has reached maximum benefit from her inpatient  hospitalization.  Her acute exacerbation of sarcoidosis is improved.  Her lactic acidosis is resolved and her diabetes is an ongoing problem  that has been managed closely on an inpatient level and will continued  to be followed up on an outpatient basis with Dr. Cato Mulligan.  The patient  is to call our office or Dr. Cato Mulligan' office if she has any further  problems prior to outpatient followup appointment.      Dirk Dress, NP      Oretha Milch, MD  Electronically Signed    KW/MEDQ  D:  04/30/2009  T:  04/30/2009  Job:  [id number]   cc:   Charlcie Cradle. Delford Field, MD, FCCP  Bruce H. Swords, MD

## 2011-02-23 NOTE — Assessment & Plan Note (Signed)
[facility]                             PULMONARY OFFICE NOTE   NAME:Holzschuh, MARLIS OLDAKER                     MRN:          [id number]  DATE:08/15/2007                            DOB:          05/18/1961    Ms. Brownley returns in followup.  This is a 50 year old white female  with pulmonary sarcoidosis, possible joint involvement.  She has noted  increased joint pain, chest discomfort, neck pain.  We had her on the  Arava at 20 mg daily.  Unfortunately, when we began the [place] in  October, last month, after a period of 2 weeks she began having  headaches and hair loss which were side effects from this.  She had to  stop this medication.  Previous to this, she had adverse reactions to  methotrexate including mucositis.  She previously has been intolerant to  Imuran.  Therefore, we have maintained the prednisone at 5 mg daily and  are now seeking other therapies for this patient's sarcoidosis. Her  sarcoidosis dates back to positive mediastinal lymph node biopsy in  January 2008 which revealed mediastinal lymphadenopathy with  noncaseating granulomas apparent.   PHYSICAL EXAMINATION:  VITAL SIGNS: Temperature today is 98, blood  pressure 124/92, pulse 91, saturation 97% on room air.  CHEST:  Clear without evidence of wheeze or rhonchi.  CARDIAC:  Exam showed a regular rate and rhythm without S3.  Normal S1  and S2.  ABDOMEN:  Soft, nontender.  EXTREMITIES:  Showed no edema or clubbing.   IMPRESSION:  Pulmonary sarcoidosis with primary mediastinal adenopathy  and pulmonary parenchymal involvement as well as probable joint  involvement.   PLAN:  Refer to Dr. Chase Picket for consideration of Remicade therapy as  a way of steroid sparing in this patient who has had difficulty with  chronic steroids.  In the interim, she will increase prednisone to 40 mg  a day, taper down 10 mg every 5 days until she gets to 20 mg a day and  hold until she sees Dr.  Phylliss Bob.   __________  Please send the last several dictations in the chart as well as  pathology and x-ray reports, CT scan reports, and copy all of this on  this letter and send it to Dr. Chase Picket.     Charlcie Cradle Delford Field, MD, [facility]  Electronically Signed    PEW/MedQ  DD: 08/15/2007  DT: 08/15/2007  Job #: [id number]   cc:   Valetta Mole. Cato Mulligan, MD  Areatha Keas, M.D.

## 2011-02-23 NOTE — Assessment & Plan Note (Signed)
[facility]                             PULMONARY OFFICE NOTE   NAME:[name], [name]                     MRN:          [id number]  DATE:07/19/2007                            DOB:          [birth year]    [name] is a 50 year old female with severe sarcoidosis with  mediastinal involvement.  She is having increased chest pain, shortness  of breath, and cough.  She is on prednisone 10 mg a day.  When we have  pulsed prednisone before, she did not tolerate this.  We attempted to  begin methotrexate in September.  She had oral sores and mucositis with  this.  We had to stop the methotrexate.   EXAM:  Temperature 98, blood pressure 104/60, pulse 76, saturation 97%  on room air.  CHEST:  Showed diminished breath sounds without evidence of wheeze or  rhonchi.  CARDIAC:  Regular rate and rhythm without S3.  Normal S1, S2.  ABDOMEN:  Soft, nontender.  EXTREMITIES:  No edema or clubbing.  SKIN:  Clear.   IMPRESSION:  Adverse reactions to methotrexate with mucositis and  ongoing sarcoidosis.   PLAN:  The patient is to begin Arava 100 mg for 3 days, then 20 mg a day  thereafter.  In attempts to reduce steroids further, we will reduce  prednisone in this regard to 5 mg a day.  If she is unable to afford  this medication, we may need to give all due consideration to re-pulsing  prednisone in this patient with difficult-to-control sarcoidosis.     Charlcie Cradle Delford Field, MD, [facility]  Electronically Signed    PEW/MedQ  DD: 07/19/2007  DT: 07/19/2007  Job #: [id number]   cc:   Valetta Mole. Swords, MD

## 2011-02-23 NOTE — Assessment & Plan Note (Signed)
[facility]                             PULMONARY OFFICE NOTE   NAME:Kolek, ELGIE LANDINO                     MRN:          [id number]  DATE:04/20/2007                            DOB:          Jul 02, 1961    Ms. Ishibashi is a 50 year old white female with a history of allergic  rhinitis, vocal cord dysfunction syndrome, reflux disease and now  sarcoidosis based on recent positive mediastinal lymph node biopsy in  September 2007, with sarcoidosis evident. She has been on a pulsing  taper of prednisone and is doing well with less shortness of breath,  decreased cough and decreased chest pain. She is now down to 15 mg a day  on prednisone.   Other medications include Qvar two sprays b.i.d. 80 mcg strength,  amlodipine 10 mg daily, atenolol/chlorthalidone 50/25 one and a half  daily, bupropion 300 mg daily, Effexor 75 mg daily. NovoLog three times  daily, potassium twice daily, Zegerid 40 mg twice daily, prednisone is  at 15 mg daily, etodolac 300 mg b.i.d.   PHYSICAL EXAMINATION:  Temperature 98.0, blood pressure 120/80, pulse  73, saturation is 97% on room air.  CHEST: Showed distant breath sounds. No evidence of wheeze or rhonchi.  CARDIAC: Showed a regular rate and rhythm without S3. Normal S1, S2.  ABDOMEN: Soft, nontender.  EXTREMITIES: Showed no edema or clubbing.  SKIN: Was clear.   IMPRESSION:  Is that of sarcoidosis with mediastinal adenopathy.   PLAN:  For this patient is to taper prednisone further down to 5 mg a  day over the next three weeks and hold at that level. Will see the  patient back in return followup.     Charlcie Cradle Delford Field, MD, [facility]  Electronically Signed    PEW/MedQ  DD: 04/20/2007  DT: 04/20/2007  Job #: [id number]

## 2011-02-23 NOTE — Assessment & Plan Note (Signed)
[facility]                             PULMONARY OFFICE NOTE   NAME:[name], [name]                     MRN:          [id number]  DATE:09/14/2007                            DOB:          1961-06-06    [name] returns in followup.  This is a 50 year old white female  history of sarcoidosis with lower airway inflammation.  She is now  receiving the Remicade injection per Dr. Phylliss Bob and is doing well with  this.  She has had 1 injection so far with no adverse side effects.  Patient maintains prednisone 20 mg daily, Zegerid 40 mg b.i.d., QVAR two  sprays b.i.d., 80 mcg strength, Tussionex p.r.n.   EXAM:  Temp 98, blood pressure 120/74, pulse 74, saturation 97% on room  air.  CHEST:  Distant breath sounds without evidence of wheeze or rhonchi.  CARDIAC EXAM:  A regular rate and rhythm without S3, normal S1 S2.  ABDOMEN:  Soft, nontender.  EXTREMITIES:  Showed no edema or clubbing.  SKIN:  Clear.   IMPRESSION:  Sarcoidosis, mediastinal adenopathy and airway involvement.   PLAN:  1. Continue Remicade type treatments per Dr. Phylliss Bob.  2. Continue steroid taper, prednisone down to 15 mg a day and hold.  3. Will see the patient back in followup in 1 month.     Charlcie Cradle Delford Field, MD, [facility]  Electronically Signed    PEW/MedQ  DD: 09/14/2007  DT: 09/14/2007  Job #: [id number]   cc:   Areatha Keas, M.D.  Bruce Rexene Edison Swords, MD

## 2011-02-23 NOTE — Assessment & Plan Note (Signed)
[facility]                             PULMONARY OFFICE NOTE   NAME:[name], [name]                     MRN:          [id number]  DATE:02/14/2007                            DOB:          December 04, 1960    [name] is a 50 year old white female with a history of sarcoidosis  with mediastinal adenopathy.  Overall, her cough and chest pain are  better on prednisone 20 mg a day.  She is less short of breath, having  less chest congestion.   MEDICATIONS:  1. The patient maintains prednisone 20 mg daily.  2. Viramist 2 sprays each nostril daily.  3. Zegerid 40 mg b.i.d.  4. Brovex daily.  5. Benzonatate 200 mg b.i.d.  Other maintenance medicines listed in the chart and correct as reviewed.   EXAM:  Temp 98, blood pressure 112/80, pulse 67, saturation 98% on room  air.  CHEST:  Clear with distant breath sounds.  No wheezes or rhonchi were  noted.  CARDIAC:  Showed a regular rate and rhythm without S3.  Normal S1, S2.  ABDOMEN:  Soft, nontender.  EXTREMITIES:  No edema or clubbing, or venous disease.  SKIN:  Clear.  NEUROLOGIC:  Intact.  HEENT:  No jugular venous distension.  No lymphadenopathy.  Oropharynx  clear.  NECK:  Supple.   IMPRESSION:  Sarcoidosis with mediastinal adenopathy.   PLAN:  Reduce prednisone to 15 mg a day, maintain Qvar 2 sprays b.i.d.  80 mcg strength, and we will see the patient back in return followup in  2 months.     Charlcie Cradle Delford Field, MD, [facility]  Electronically Signed    PEW/MedQ  DD: 02/14/2007  DT: 02/14/2007  Job #: [id number]

## 2011-02-23 NOTE — Assessment & Plan Note (Signed)
[facility]                             PULMONARY OFFICE NOTE   NAME:[name], [name]                     MRN:          [id number]  DATE:06/21/2007                            DOB:          05-Aug-1961    [name] returns and is a 50 year old female with a history of  allergic rhinitis, vocal cord dysfunction, reflux disease, and now  progressive sarcoidosis with positive mediastinal biopsy.  She is now  having increased chest tightness, shortness of breath, body aches, and  dry cough.  She had gotten her prednisone dose down to 5 mg daily.   MEDICATIONS:  1. Maintains Qvar 2 sprays b.i.d. 80 mcg strength.  2. Amlodipine 10 mg daily.  3. Atenolol chlorthalidone 50/25 one-and-a-half daily.  4. Effexor 75 mg daily.  5. Potassium b.i.d.  6. Zegerid 40 mg b.i.d.  7. Etodolac 300 mg off.  8. Simvastatin 40 mg daily.   EXAM:  This is an obese female in no acute distress.  Temperature 97, blood pressure 124/82, pulse 94, saturation 98% on room  air.  CHEST:  Showed diminished breath sounds with prolonged expiratory phase.  No wheeze or rhonchi noted.  CARDIAC:  Showed a regular rate and rhythm without S3.  Normal S1, S2.  ABDOMEN:  Soft and nontender.  EXTREMITIES:  No edema or clubbing.  SKIN:  Clear.  NEUROLOGIC:  Intact.  HEENT:  No jugular venous distension or lymphadenopathy.  Oropharynx  clear.  NECK:  Supple.   IMPRESSION:  Sarcoidosis with mild flare.   PLAN:  Would be to begin methotrexate 10 mg weekly.  Baseline labs are  obtained showing white count 14,000.  Normal liver function and renal  function.  The patient will pulse taper prednisone back up to 20 mg a  day, then taper back down from there.  We will see the patient back in  return followup.     [name] Cradle Delford Field, MD, [facility]  Electronically Signed   PEW/MedQ  DD: 06/22/2007  DT: 06/23/2007  Job #: [id number]   cc:   Valetta Mole. Swords, MD

## 2011-02-23 NOTE — H&P (Signed)
[name], [name]              ACCOUNT NO.:  [id number]   MEDICAL RECORD NO.:  [id number]          PATIENT TYPE:  INP   LOCATION:  2902                         FACILITY:  [facility]   PHYSICIAN:  Clinton D. Maple Hudson, MD, FCCP, FACPDATE OF BIRTH:  10-28-58   DATE OF ADMISSION:  04/27/2009  DATE OF DISCHARGE:                              HISTORY & PHYSICAL   ADMITTING DIAGNOSES:  1. Acute bronchitis.  2. Pulmonary sarcoid with exacerbation.  3. Diabetes.  4. Lactic acidosis.   HISTORY OF PRESENT ILLNESS:  [name] followed by Dr.  Cato Mulligan for primary care, Dr. Delford Field for pulmonary, and Dr. Azzie Roup for Rheumatology who presented to the emergency room with her  husband tonight with a chief complaint of cough and dyspnea.  I was  called for admission.  She had last seen Dr. Delford Field June 29 and those  records are reviewed.  No acute change in care was needed at that time.  She has had a history of cyclical cough and mediastinal adenopathy with  chest pain and stable pulmonary nodules.  She had not tolerated systemic  steroids well and had had adverse side effects with leflunomide.  Because of that since 2008, she has been managed with Remicade injection  every 6 weeks and had seemed to do well with that.  She has also been  managed on prednisone 5 mg daily.  She says she was in her usual health  about 8 to 10 days ago and was helping her mother move furnishings after  a new carpet was put down in her mother's home.  She fell and injured  her arm, went to an Urgent Care, where they were more impressed with her  cough and ended up giving her Z-Pak and a ProAir inhaler.  The  implications is that she had been coughing and had been putting up with  it.  Since that time, she has had persistent cough and wheeze, but  without fever, chills, purulent or bloody sputum or other systemic  change.  She had had no sick exposure or travel and had not been  outdoors in  environmental air quality issues significantly.  Current  symptoms are similar to but worse than her usual exacerbations.  She was  given continuous albuterol nebulizer, 125 mg of Solu-Medrol without  substantial improvement in cough.  Chart review indicates symptoms over  the last 3-4 months of variable cough, some chest pains, sinus pressure,  postnasal drip, shortness of breath, nausea, and sweats.   REVIEW OF SYSTEMS:  Now is significant for complaint of splitting  headache and cough, anxiety, and dyspnea.  She denies rash or  adenopathy, fever, or chills, purulent or bloody sputum, nausea or  vomiting, abdominal pain, dysuria, joint pain, leg tightness, or calf  pain.  She says stools have been loose today.   PAST MEDICAL HISTORY:  Headache, peripheral edema, menopause, urinary  tract infection, sarcoid with pulmonary nodules and mediastinal  adenopathy, hypertension, hyperlipidemia, GERD, diabetes mellitus type  2, and a nonspecific anemia.   PAST SURGICAL HISTORY:  Ventral hernia repair, Nissen  fundoplication,  cholecystectomy, hysterectomy.   FAMILY HISTORY:  Parents are alive.  An uncle has sarcoid.  A brother  died of lung cancer.   SOCIAL HISTORY:  Married.  Never smoked, on disability.   OBJECTIVE:  GENERAL APPEARANCE:  Markedly overweight woman who was  finishing a nebulizer treatment when I entered the room.  VITAL SIGNS:  Heart rhythm by monitor regular 112 per minute, oxygen  saturation 100% on nebulizer treatment, remained at 100% when I switched  her to 4 L nasal prongs, respiratory rate about 32 per minute,  temperature 97.1, BP 135/81.  NEUROLOGIC:  Oriented to person, place, and circumstance.  Able to move  all extremities, fine tremor.  HEENT:  Pupils equal and reactive.  Tongue protrudes midline.  Oral  mucosa clear.  She has her own teeth.  NECK:  Veins are not distended.  There is no stridor or thyromegaly.  CHEST:  Active dry cough, somewhat labored with  no rub or dullness,  slight wheeze in the bases.  Heart sound is regular without murmur,  gallop, or rub.  There is no peripheral edema.  ABDOMEN:  Markedly obese, soft.  I cannot feel liver or spleen.  Nontender.  Bowel sounds are present.  BREASTS:  Not examined at this time.  GENITALIA:  Not examined at this time.  RECTAL:  Not examined at this time.  EXTREMITIES:  Heavy legs without cords.  Pulses are intact without  clubbing.   LABORATORY DATA:  Chest x-ray reviewed, bibasilar atelectasis,  borderline cardiac enlargement.  ABG on uncertain FIO2 showed pH of 7.66  with PT of 20.7, pO2 of 103, bicarbonate 23.  Interpretation, acute  respiratory alkalosis.  A gradient reflected a pO2 of only 103 with this  degree of hyperventilation on supplemental oxygen.  Note that she is not  on home oxygen.  Urinalysis is negative with pH of 6.5.  WBC 15,800,  hemoglobin 12.4, platelet count 312,000.  Sodium 135, potassium 3.9,  chloride 98, bicarbonate 24, BUN 9, creatinine 0.66, glucose 166.  LFTs  are normal.  Anion gap is about 17.  INR 1.0 with PT 13.3.  Troponin I  less than 0.05, D-dimer 0.26 indicating very low probability for  pulmonary embolism.   IMPRESSION:  Significant cough and dyspnea, may be an exacerbation of  sarcoid or an acute tracheal bronchitis superimposed on sarcoid.  Her  degree of hyperventilation suggests a significant anxiety component.  Leukocytosis might be from steroids, but infection is not yet excluded  and the significance of lactic acid in this woman on oral hypoglycemics  is not yet established.  She has already been given one dose of Solu-  Medrol.  She apparently does not tolerate systemic high dose steroids  well but since this is on board, we will be able to watch for effect.  I  will admit her,  anticipating [facility] may be able to provide the level  of attention she will need tonight.  I will get cultures, provide  supplemental oxygen, and check her ACE  level and repeat abnormal labs.  Without a specific target, I am going to wait on antibiotics tonight.  She is admitted to the service of Dr. Delford Field.  Blood sugars will be  managed by sliding scale.      Clinton D. Maple Hudson, MD, Tonny Bollman, FACP  Electronically Signed     CDY/MEDQ  D:  04/28/2009  T:  04/28/2009  Job:  [id number]

## 2011-02-23 NOTE — Op Note (Signed)
[name], [name]              ACCOUNT NO.:  [id number]   MEDICAL RECORD NO.:  [id number]          PATIENT TYPE:  AMB   LOCATION:  DSC                          FACILITY:  [facility]   PHYSICIAN:  Robert A. Thurston Hole, M.D. DATE OF BIRTH:  1960/10/26   DATE OF PROCEDURE:  11/12/2008  DATE OF DISCHARGE:                               OPERATIVE REPORT   DATE OF SURGERY:  October 12, 2008.   PREOPERATIVE DIAGNOSES:  1. Right shoulder rotator cuff tendinitis with impingement.  2. Right shoulder partial ligament tear.  3. Right shoulder acromioclavicular joint degenerative joint disease      and spurring.   POSTOPERATIVE DIAGNOSES:  1. Right shoulder rotator cuff tendinitis with impingement.  2. Right shoulder partial ligament tear.  3. Right shoulder acromioclavicular joint degenerative joint disease      and spurring.   PROCEDURES:  1. Right shoulder exam under anesthesia followed by subacromial      decompression.  2. Right shoulder partial ligament tear debridement.  3. Right shoulder distal clavicle excision.   SURGEON:  Elana Alm. Thurston Hole, M.D.   ANESTHESIA:  General.   OPERATIVE TIME:  45 minutes.   COMPLICATIONS:  None.   INDICATIONS FOR PROCEDURE:  [name] is a 50 year old woman who has  had significant right shoulder pain for the past 6-9 months increasing  in nature with examined MRI documenting rotator cuff tendinitis with  impingement, with significant AC joint  arthropathy.  She has failed  conservative care and is now to undergo arthroscopy.   DESCRIPTION:  [name] was brought to the operating room on November 12, 2008, placed on operative table in supine position.  After being  placed under general anesthesia, her right shoulder was examined.  She  had full range of motion.  Her shoulder was stable to ligamentous exam.  She received Ancef 1 g IV preoperatively for prophylaxis.  The shoulder  was sterilely injected with 0.25% Marcaine with epinephrine.  She  was  then placed on beach chair position, and her shoulder and arm were  prepped using sterile DuraPrep and draped using sterile technique.  Originally, through the posterior arthroscopic portal, the arthroscope  with a pump attached was placed into an anterior portal, and  arthroscopic probe was placed.  On initial inspection, the articular  cartilage and glenohumeral joint was intact.  Anterior labrum, partial  tearing 25% which was debrided.  Anteroinferior labrum and  anteroinferior glenohumeral ligament complex was intact.  Biceps tendon  anchor and biceps tendon was intact.  Posterior labrum moderate fraying  25% which was debrided.  Rotator cuff on the articular surface showed no  evidence of a tear.  The inferior  capsular recess  is free pathology.  Subacromial space was entered and a lateral arthroscopic portal was  made.  Significant bursitis was resected.  The rotator cuff was somewhat  inflamed and thickened on the bursal surface, but no evidence of a tear.  Impingement was noted and a subacromial decompression was carried out  removing 6 mm to 8 mm of the undersurface of the anterior, anterolateral  and anteromedial  acromion, and CA ligament release carried out as well.  The [facility] joint showed significant spurring, synovitis, and degenerative  changes, and distal 6-mm clavicle was resected with a 6-mm burr.  After  this was done, the shoulder could be brought through a full range of  motion with no impingement on the rotator cuff.  At this point, it is  felt that all pathology have been satisfactorily addressed.  The  instruments were removed.  The portals were closed with 3-0 nylon  sutures and injected 0.25% Marcaine with epinephrine.  Sterile dressings  and a sling applied, and the patient awakened and taken to the recovery  room in stable condition.   FOLLOWUP CARE:  [name] will be followed overnight in [facility] for IV pain control, neurovascular  monitoring, and monitoring of  her oxygenation.  She will be discharged tomorrow on Percocet and  Robaxin with early physical therapy.  She will be back in our office for  sutures out and followup.      Robert A. Thurston Hole, M.D.  Electronically Signed     RAW/MEDQ  D:  11/12/2008  T:  11/13/2008  Job:  [id number]

## 2011-02-25 ENCOUNTER — Encounter: Payer: Self-pay | Admitting: Internal Medicine

## 2011-02-25 ENCOUNTER — Ambulatory Visit (INDEPENDENT_AMBULATORY_CARE_PROVIDER_SITE_OTHER): Payer: BC Managed Care – PPO | Admitting: Internal Medicine

## 2011-02-25 DIAGNOSIS — E119 Type 2 diabetes mellitus without complications: Secondary | ICD-10-CM

## 2011-02-25 DIAGNOSIS — I10 Essential (primary) hypertension: Secondary | ICD-10-CM

## 2011-02-25 DIAGNOSIS — D860 Sarcoidosis of lung: Secondary | ICD-10-CM

## 2011-02-26 NOTE — Discharge Summary (Signed)
   [name], [name]                        ACCOUNT NO.:  [id number]   MEDICAL RECORD NO.:  [id number]                   PATIENT TYPE:  INP   LOCATION:  9323                                 FACILITY:  [facility]   PHYSICIAN:  Timothy P. Fontaine, M.D.           DATE OF BIRTH:  10/29/1960   DATE OF ADMISSION:  11/15/2002  DATE OF DISCHARGE:  11/17/2002                                 DISCHARGE SUMMARY   DISCHARGE DIAGNOSES:  1. Dysmenorrhea.  2. Menorrhagia.  3. Leiomyomata.  4. Bilateral hydrosalpinx.  5. Suspected left ovarian endometrioma.  6. Status post supracervical hysterectomy, left salpingo-oophorectomy, right     salpingectomy by Dr. Colin Broach on November 15, 2002.   HISTORY:  This is a 41-years-of-age female gravida 2 para 2 with enlarging  leiomyomata, increasing dysmenorrhea, and menorrhagia for a total abdominal  hysterectomy.   HOSPITAL COURSE:  On November 15, 2002 the patient was admitted and  subsequently underwent a supracervical hysterectomy, left salpingo-  oophorectomy, right salpingectomy.  The findings were that the uterus was  grossly normal in size; the left adnexa, however, was obliterated with  hydrosalpinx and adhesive mass; the ovary not really identified; right  hydrosalpinx with normal-appearing adjacent ovary, large 12+ cm lower  uterine segment leiomyoma filling the pelvis; filmy adhesions bilaterally  involving large intestine, small intestine was all sharply lysed; chocolate-  material extruding from the left adnexa suspicious for endometrioma although  ovarian tissue was not clearly identified.  Postoperatively it was noted  that potassium was 3.2 which was added to the IV, increased to 40 mEq per  liter.  On November 16, 2002 the patient was afebrile, in stable condition.  On November 16, 2002 the patient continued to do well.  On November 17, 2002  the patient was afebrile, voiding, in stable condition.  JP drain was  removed.  The patient  was felt in satisfactory condition for discharge.   ACCESSORY CLINICAL FINDINGS/LABORATORY DATA:  On November 16, 2002 hemoglobin  was 9.6.   DISPOSITION:  1. The patient is to follow up the following Monday for staple removal.  2. Given Tylox p.r.n. pain.     Susa Loffler, P.A.                    Timothy P. Fontaine, M.D.    TSG/MEDQ  D:  12/21/2002  T:  12/21/2002  Job:  [id number]

## 2011-02-26 NOTE — Op Note (Signed)
[name], [name]                        ACCOUNT NO.:  [id number]   MEDICAL RECORD NO.:  [id number]                   PATIENT TYPE:  INP   LOCATION:  9323                                 FACILITY:  [facility]   PHYSICIAN:  Timothy P. Fontaine, M.D.           DATE OF BIRTH:  1961-01-12   DATE OF PROCEDURE:  11/15/2002  DATE OF DISCHARGE:                                 OPERATIVE REPORT   PREOPERATIVE DIAGNOSES:  1. Dysmenorrhea.  2. Menorrhagia.  3. Leiomyomata.   POSTOPERATIVE DIAGNOSES:  1. Dysmenorrhea.  2. Menorrhagia.  3. Leiomyomata.  4. Bilateral hydrosalpinx.  5. Suspected left ovarian endometrioma.   PROCEDURE:  1. Supracervical hysterectomy.  2. Left salpingo-oophorectomy.  3. Right salpingectomy.   SURGEON:  Timothy P. Fontaine, M.D.   ASSISTANT:  Katy Fitch, M.D.   ANESTHESIA:  General endotracheal.   ESTIMATED BLOOD LOSS:  600-800 mL.   COMPLICATIONS:  None.   SPECIMENS:  Supracervical uterus, myoma with multiple fragments, left  adnexa, right hydrosalpinx.   FINDINGS:  Uterus grossly normal in size.  Left adnexa obliterated with  hydrosalpinx and adhesive mass; ovary not clearly identified.  Right  hydrosalpinx with normal appearing adjacent ovary.  Large 12+ cm lower  uterine segment myoma, filling the pelvis.  Filmy adnexal adhesions  bilaterally involving large intestine, epiploica, and small intestine, were  all sharply lysed.  Chocolate-like material extruding from left adnexa,  suspicious for endometrioma; although ovarian tissue not clearly identified.   DESCRIPTION OF PROCEDURE:  The patient was taken to the operating room and  underwent general endotracheal anesthesia.  She was placed in the supine  position, received an abdominal and vaginal preparation with Betadine  solution.  The bladder was emptied with an indwelling Foley catheter placed  in a sterile technique.   The patient was draped in the usual fashion, and the abdomen was  sharply  entered through a Pfannenstiel incision, achieving adequate hemostasis at  all levels.  A Balfour retractor and bladder blade were placed within the  incision, and the intestines were packed from the operative field using an  upper arm on the Balfour retractor for retention.   Examination of the pelvic organs revealed a somewhat normal appearing uterus  with a large lower uterine segment myoma filling the pelvis, right and left  hydrosalpinx, and obliterated left adnexa where the normal ovary could not  be appreciated, and a normal appearing right ovary.  There were large and  small intestines adherent to both adnexa with filmy adhesions, which were  sharply lysed.   Due to the large nature of the myoma, it was quickly apparent that uterine  vasculature could not be isolated and clamped.  It was decided to initiate  the hysterectomy from above.   The left adnexa was inspected and upon freeing this from the adhesions,  chocolate-like material extruded from this adnexa, consistent with  endometrioma, although ovarian tissue could not be  clearly identified due to  the adhesive mass.  We decided to sacrifice this adnexa, given the gross  abnormality both in the hydrosalpinx and adhesive disease.  The  infundibulopelvic ligament and vessels were identified, doubly clamped, cut  and ligated.   The right adnexa was inspected and initially the uterine ovarian pedicle was  doubly clamped, cut and ligated using #0 Vicryl suture.  It was apparent  from the large myoma that we could no longer progress laterally with  identification and isolation of the uterine vessels, and that a myomectomy  would be necessary to expose the pelvis.   The right and left lateral ligaments were identified and were transected  using electrocautery.  The bladder flap was sharply developed anteriorly,  and subsequently, using a mixture of Pitressin with 20 units/50 mL normal  saline, the serosa overlying the  lower uterine segment myoma was infiltrated  in the midline.  Subsequently, using electrocautery, the serosa was  transected into the myoma.  Through sharp and blunt dissection, the myoma  was initially freed from the surrounding uterine tissue.  Due to the large  size of the myoma, several passes were made to core out large fragments of  the myoma to allow the surrounding myoma to collapse to allow better lateral  visualization.  Through progressive passes the myoma was subsequently  excised and this allowed visualization of both parametrial regions.  Subsequently, the uterine vessels on both the right and left were  identified, clamped, cut and ligated using #0 Vicryl suture.   Due to the deep pelvis, again limited visualization due to significant  adipose tissue within the pelvis, a supracervical hysterectomy was performed  to allow better visualization.  On reinspection of the pelvis and the  anatomy, it became apparent that a supracervical hysterectomy was indicated  for fear that continued progress in an attempt to remove the cervix would be  extremely difficult and put the patient at risk.  The endocervical canal was  identified and the Bovie was passed into the canal on coagulation mode to  obliterate as much glandular lining as possible.  The stump of the cervix  was also cauterized to achieve ultimate hemostasis.  There was some bleeding  from generous vasculature in the right bladder pillar and several initial  attempts to ligate these vessels was unsuccessful.  Subsequently, medium  surgical clips were applied in this region to achieve ultimate hemostasis.   The pelvis was copiously irrigated.  One area of ooze at the left bladder  pillar was addressed with Surgicel, which achieved ultimate hemostasis.  The  pelvis again was irrigated and adequate hemostasis was visualized.  Attention was then turned to the right hydrosalpinx, and on reinspection,  the right ovary appeared  normal and separate from this.  The mesosalpinx was  clamped and the hydrosalpinx was excised.  The mesosalpinx was then ligated  using #0 Vicryl suture in suture ligature.  The pedicles were all then  reinspected; adequate hemostasis was visualized.  The upper arm was removed  from the [place].  The bowel packing removed.  The Balfour and bladder blade  removed.   The anterior fascia was then reapproximated using #0 Vicryl in a running  stitch, starting at the end and meeting in the middle.  A Jackson-Pratt  drain was placed in the subcutaneous tissues due to the wide adipose layer.  The overlying skin was reapproximated using staples.  A sterile dressing was  applied.   The patient was taken to  the recovery room in good condition, having  tolerated the procedure well.  The patient did receive Cefotan IV  prophylaxis and thigh-high PAS intermittent compression stockings.                                               Timothy P. Audie Box, M.D.    TPF/MEDQ  D:  11/15/2002  T:  11/16/2002  Job:  [id number]

## 2011-02-26 NOTE — Op Note (Signed)
[place]. [facility]  Patient:    [name], [name]                     MRN: [id number] Proc. Date: 03/03/00 Adm. Date:  [id number] Disc. Date: [id number] Attending:  Brandy Hale CC:         Valetta Mole. Swords, M.D. [facility]             John N. Eda Keys., M.D. [facility]                           Operative Report  PREOPERATIVE DIAGNOSIS:  Ventral incisional hernia.  POSTOPERATIVE DIAGNOSIS:  Ventral incisional hernia.  OPERATION PERFORMED:  Laparoscopic repair of ventral hernia with Dual mesh.  SURGEON:  [name]. [name], M.D.  ASSISTANT:  Velora Heckler, M.D.  ANESTHESIA:  INDICATIONS FOR PROCEDURE:  The patient is a 50 year old white female who is morbidly obese.  She has had an open cholecystectomy in the past.  She has had an open Nissen fundoplication in the past.  She presented in April of this year with abdominal cramps and pain above her umbilicus and was found to have a slightly tender reducible incisional hernia about three inches above the umbilicus.  There was no hernia or weakness in her right subcostal incision. She has had extensive GI work-up which is otherwise negative.  She is brought to the operating room electively for repair of her symptomatic ventral hernia.  OPERATIVE FINDINGS:  The patient had a hernia defect above her umbilicus which was about three to four inches in diameter with a hernia sac which was much larger.  Up along the midline all the way to the subxiphoid area there were areas of weakness which were noted upon visualization.  She had moderate adhesions in the upper midline which were soft and chronic and were taken down sharply and bluntly.  Her small intestine and large intestine were otherwise grossly normal to inspection.  DESCRIPTION OF PROCEDURE:  Following induction of general endotracheal anesthesia, the patients abdomen was prepped and draped in sterile fashion. A Foley catheter had been previously inserted.   0.25% Marcaine with epinephrine was used as a local infiltration anesthetic.  A short transverse incision was made in the left flank anterior axillary line midway between the costal margin and the anterior superior iliac spine.  Dissection was carried down to the external oblique muscle which was incised and we went through the muscle layers under direct vision until we could elevate the peritoneum and make a small nick in the peritoneum and enter the peritoneal cavity under direct vision.  A 10 mm Hasson trocar was inserted into the peritoneal cavity and secured with a pursestring suture of 0 Vicryl on the fascia. Pneumoperitoneum was created.  Video camera was inserted with visualization and with the findings as described above.  We placed two 5 mm trocars on the left side, one in the left lower quadrant and one in the left upper quadrant and ultimately a 5 mm trocar in the right midabdomen.  Photographs were taken of the hernia defect.  We had to clear a fair amount of adhesions from the upper midline away from the falciform ligament to prepare for placement of the mesh.  After we had all the adhesions taken down, we placed spinal needles through the abdominal wall to outline the margins of all the hernia defects. We then measured 3 to 4 cm peripheral  to this and marked on the abdominal wall an elliptical shaped area for the intended mesh.  The size of the mesh turned out to be 15 cm transversely by 25 cm vertically.  We brought a large sheet of Dual mesh to the operative field and cut the mesh in the exact shape that had been marked on the abdominal wall as a template.  We marked the mesh in the 12 oclock, 3 oclock, 6 oclock and 9 oclock positions.  We placed sutures of 0 Novofil in the mesh at the 12, 3, 6 and 9 oclock positions and then rolled the mesh up and inserted it into the abdominal cavity.  The mesh was spread out and oriented properly.  We made small incisions about 2 to 3 cm  peripheral to the marked ring on the abdominal wall and in all four locations we passed the endoclose device and brought out both ends of the Novofil suture.  Once we had all of these sutures passed through the abdominal wall, we pulled the mesh up and found that it covered the defect quite nicely and there was no redundancy to the mesh.  We tied all four sutures which fixed the dual mesh to the abdominal wall.  Great care was taken to make sure that this smooth side of the mesh was toward the intestinal contents and the rough side of the mesh was toward the abdominal wall.  We then further secured the mesh to the adbominal wall with the 5 mm tacking device.  Two concentric rings of tacks were placed, one just at the edge of the mesh and then another concentric ring about 3 cm internally and we fired about 45 to 50 tacks to thoroughly secure the mesh to the abdominal wall.  After this was done, photographs were taken. We inspected all the areas of dissection and everything was hemostatic and there was no evidence of any intestinal injury.  The trocars were removed under direct vision and there was no bleeding from trocar sites.  The pneumoperitoneum was released.  The fascia down in the left flank trocar site was closed with 0 Vicryl suture.  The skin incisions were closed with subcuticular sutures of 4-0 Vicryl and Steri-Strips.  Clean bandages were placed and the patient taken to the recovery room in stable condition. Estimated blood loss was about 15 to 20 cc.  Complications were none.  Sponge, needle and instrument counts were correct. DD:  03/03/00 TD:  03/07/00 Job: [id number]

## 2011-02-26 NOTE — Assessment & Plan Note (Signed)
[place] [facility]                               PULMONARY OFFICE NOTE   NAME:Dorrance, SHAYONA HIBBITTS                     MRN:          [id number]  DATE:08/11/2006                            DOB:          Mar 30, 1961    Ms. Tison is a 50 year old, white female with history of mediastinal  adenopathy, vocal cord dysfunction syndrome, reflux disease and obstructive  sleep apnea.  The patient continues to have some cough and upper airway  irritability, but is improved overall.  She is here for followup of her  recent CT scan.  This was reviewed and showed stable mediastinal adenopathy  with no new or worsening disease seen.   CURRENT MEDICATIONS:  Unchanged, reviewed and is listed in the chart.   PHYSICAL EXAMINATION:  VITAL SIGNS:  Temperature 98, blood pressure 124/80,  pulse 92, saturations 97% on room air.  CHEST:  Clear with no evidence of wheeze, rale, rhonchi or other  adventitious breath sounds.  CARDIAC:  Regular rate and rhythm without S3.  Normal S1, S2.  ABDOMEN:  Soft, nontender.  EXTREMITIES:  No edema or clubbing.  SKIN:  Clear.  NEUROLOGIC:  Intact.  NECK:  No jugular venous distention, no lymphadenopathy.  Supple.  HEENT:  Oropharynx clear.   IMPRESSION:  Stable mediastinal adenopathy, likely due to chronic  inflammation, possibly sarcoidosis.   PLAN:  Repeat CT scan in 3 months to assess for stability.  No indication  for mediastinoscopy is held and will see the patient back in return  followup.     Charlcie Cradle Delford Field, MD, [facility]  Electronically Signed    PEW/MedQ  DD: 08/11/2006  DT: 08/12/2006  Job #: [id number]   cc:   Valetta Mole. Swords, MD

## 2011-02-26 NOTE — H&P (Signed)
[name], [name]                        ACCOUNT NO.:  [id number]   MEDICAL RECORD NO.:  [id number]                   PATIENT TYPE:  EMS   LOCATION:  [facility]JO                                 FACILITY:  [facility]   PHYSICIAN:  Lonzo Cloud. Kriste Basque, M.D. [facility]            DATE OF BIRTH:  03/11/1961   DATE OF ADMISSION:  08/18/2003  DATE OF DISCHARGE:                                HISTORY & PHYSICAL   HISTORY:  The patient is a 50 year old white female patient of Dr. Birdie Sons with multiple medical problems and a long history of vocal cord  dysfunction.  She has had multiple previous evaluations from multiple  pulmonologists (most recently Dr. Delford Field; several ENT specialists (including  speech pathology and workups at [facility]);  gastroenterology and general surgery with 2 previous Nissen fundoplications;  and psychiatry with Effexor and Paxil currently prescribed. She states that  she has had a several day history of increasing tightness in her throat  followed by increasing shortness of breath, severe, dyspnea, wheezing, very  severe this morning, prompting her to come to [facility] Emergency  Room where she was evaluated by Dr. Freida Busman of the ER staff. He was unable to  relieve her dyspnea despite handheld nebulizer treatments, but to 7 mg  Ativan given IV slowly in multiple doses, IV morphine, Tussionex, etc.  He,  therefore, referred her for admission for refractory dyspnea and vocal cord  dysfunction.  As noted, the old chart is replete with similar admissions for  this problem.   PAST MEDICAL HISTORY:  1. She has a history of obesity and diabetes for which she is on diet and     Avandamet.  2. She has history of hypertension controlled on Norvasc and Tenoretic.  3. She has a history of severe reflux with 2 previous Nissen     fundoplications, the last was a redo procedure in June 2003.  She is on     large dose of Aciphex and ranitidine.  4. Irritable  bowel syndrome on Zelnorm.  5. History of hypercholesterolemia on Lipitor.  6. History of severe anxiety on Effexor and Paxil, but I do not see a     benzodiazepine on her current list.  7. Supracervical hysterectomy, left ovary and tube and right tube removed     February 2004 by Dr. Audie Box because of fibroids.  8. Status post cholecystectomy, 2 previous Nissen fundoplications as noted,     and appendectomy.  9. She had a ventral hernia repair in June 2003.   MEDICATIONS:  1. Tessalon Perles 1-2 tablets p.o. q.4h. as needed for cough.  2. Tussionex 5 cc p.o. q.12h. as needed for cough.  3. Aciphex 20 mg tablets she takes 3 tablets twice a day!  4. Ranitidine 300 mg p.o. q.h.s.  5. Reglan 10 mg p.o. q.i.d.  6. Zelnorm 2 mg tablets, 2 tablets p.o. b.i.d.  7.  Norvasc 10 mg p.o. daily.  8. Tenoretic 50/12.5 1 tablet p.o. daily.  9. K-Dur 20 1 tablet p.o. daily.  10.      Effexor XR 75 1 tablet p.o. daily.  11.      Paxil 40 mg p.o. daily .  12.      Avandamet 1000/2, one tablet p.o. b.i.d.  13.      Lipitor 40 mg p.o. q.h.s.   FAMILY HISTORY:  Family history is negative for respiratory disease, but  positive for diabetes and heart disease.   SOCIAL HISTORY:  The patient is a nonsmoker.  She denies any alcohol.  She  used to work in Audiological scientist at [employer].   PHYSICAL EXAMINATION:  GENERAL:  Physical examination an obese, 50 year old  white female, short of breath; and classically hyperventilating in the  emergency room. It is interesting to note that the hyperventilation totally  extinguished when she answered questions or was distracted.  VITAL SIGNS:  Blood pressure 124/64, pulse 90 up to 120/minute and regular.  Respirations 32 up to 36 per minute and deep with a saturation of 100%.  Temperature 97.1.  HEENT:  Negative except for dry mucous membranes due to mouth breathing.  NECK:  Negative with no jugular distention, no carotid bruits, no  thyromegaly or  lymphadenopathy.  CHEST:  Chest exam was clear to percussion and auscultation.  No wheezing or  stridor noted.  She did have some expiratory wheezing with forced expiratory  maneuver at the cords.  CARDIOVASCULAR:  Cardiac exam revealed a regular rhythm with a grade 1/6  systolic ejection murmur at the left sternal border.  No rubs or gallops  heard.  ABDOMEN:  The abdomen was obese with 2 surgical scars.  Soft, minimal  tenderness in the epigastrium on palpation. No evidence of organomegaly or  masses.  EXTREMITIES:  Extremities showed no clubbing, cyanosis, or edema.  There was  some venous insufficiency.  Neurologic examination was intact without focal  abnormality detected.   IMPRESSION:  A patient with severe vocal cord dysfunction, worsening over  the last 3 days and severe this morning.  The ER staff had trouble breaking  her, despite 7 mg of Ativan IV and morphine.  She is being admitted for  continued treatment.                                                Lonzo Cloud. Kriste Basque, M.D. [facility]    SMN/MEDQ  D:  08/18/2003  T:  08/18/2003  Job:  [id number]   cc:   Valetta Mole. Swords, M.D. [facility]

## 2011-02-26 NOTE — Op Note (Signed)
[name], [name]              ACCOUNT NO.:  [id number]   MEDICAL RECORD NO.:  [id number]          PATIENT TYPE:  AMB   LOCATION:  SDS                          FACILITY:  [facility]   PHYSICIAN:  Ines Bloomer, M.D. DATE OF BIRTH:  [birth year]   DATE OF PROCEDURE:  11/07/2006  DATE OF DISCHARGE:  11/07/2006                               OPERATIVE REPORT   PREOPERATIVE DIAGNOSIS:  Mediastinal adenopathy.   POSTOPERATIVE DIAGNOSIS:  Mediastinal adenopathy.   OPERATIONS PERFORMED:  Fiberoptic bronchoscopy and mediastinoscopy.   SURGEON:  Ines Bloomer, M.D.   DESCRIPTION OF PROCEDURES:  After general anesthesia, the fiberoptic  bronchoscope was passed into the endotracheal tube.  The right upper  lobe, the right middle lobe and the right lower lobe orifices were  patent.  No endobronchial lesion was seen, but there was a lot of  inflammation.  The carina was in the midline.  The left main stem, left  upper lobe and left lower lobe orifices were also normal, with a lot of  bronchitis and inflammation.  Cultures and cytologies were taken.  The  video bronchoscope was removed.  The anterior neck was prepped and  draped in the usual sterile manner.  A transverse incision was made at  the sternal notch and carried down directly through subcutaneous tissues  and fascia.  The tracheal fascia was entered, and biopsies of 4R x2 and  a 2R node were done.  Frozen section revealed nonencaseating  granulomatous disease, consistent with sarcoid.  The strap muscles were  closed with 2-0 Vicryl, the subcutaneous tissues with 3-0 Vicryl, and  Dermabond for the skin.  The patient returned to the recovery room in  stable condition.      Ines Bloomer, M.D.  Electronically Signed     DPB/MEDQ  D:  11/07/2006  T:  11/07/2006  Job:  [id number]   cc:   Dorita Sciara, MD  Charlcie Cradle. Delford Field, MD, FCCP

## 2011-02-26 NOTE — Assessment & Plan Note (Signed)
[place] [facility]                             PULMONARY OFFICE NOTE   NAME:Bannister, [name] YEARWOOD                     MRN:          [id number]  DATE:10/20/2006                            DOB:          [birth year]    Ms. Leoni is seen today in followup.  Still complaining of chest  pain, chest discomfort, but her shortness of breath is satisfactory.  She has progressive mediastinal adenopathy and is concerned over the  etiology of same.   On exam temp 98, blood pressure 130/80, pulse 90, saturation 98% room  air.  CHEST:  Show diminished breath sounds with prolonged expiratory phase.  No wheeze or rhonchi.  CARDIAC:  Showed a regular rate and rhythm without S3.  Normal S1, S2.  ABDOMEN:  Was soft, nontender.  EXTREMITIES:  Showed no edema or clubbing.  SKIN:  Was clear.   IMPRESSION:  Is that of mediastinal adenopathy with progression.   PLAN:  For this patient is to refer to Dr. Karle Plumber for  mediastinoscopy.  He will see her on November 02, 2006.     Charlcie Cradle Delford Field, MD, [facility]  Electronically Signed    PEW/MedQ  DD: 10/26/2006  DT: 10/27/2006  Job #: [id number]   cc:   Valetta Mole. Cato Mulligan, MD  Ines Bloomer, M.D.

## 2011-02-26 NOTE — Assessment & Plan Note (Signed)
[name]                               PULMONARY OFFICE NOTE   NAME:[name], [name]                     MRN:          [id number]  DATE:05/04/2006                            DOB:          1961/06/29    [name] is a 50 year old white female, history of severe vocal cord  dysfunction syndrome and cyclical cough.  Patient notes continued shortness  of breath, dry cough, coughing paroxysms worse in the current heat  environment.  Patient maintains Flonase, 2 sprays each nostril daily, but is  running out of this.  Maintains Tussionex as needed.  Benzonatate 200 mg  t.i.d., metoclopramide 10 mg t.i.d., BroveX 5 cc daily.   PHYSICAL EXAMINATION:  VITAL SIGNS:  Temp 98, blood pressure 130/80, pulse  88, saturation 98% on room air.  CHEST:  Clear without evidence of any wheeze or rhonchi.  CARDIAC:  Regular rate and rhythm without S3.  Normal S1 and S2.  ABDOMEN:  Protuberant.  Bowel sounds are active.  EXTREMITIES:  No clubbing or edema.  SKIN:  Clear.  NEUROLOGIC:  Intact.  HEENT/NECK  No jugular venous distention.  No lymphadenopathy.  Oropharynx  clear.  Neck supple.   IMPRESSION:  Severe cyclical cough syndrome with vocal cord dysfunction  syndrome.   PLAN:  Patient will use Mepergan Forte as needed for cough.  Increase  Tussionex to 5 cc t.i.d. on a cyclic cough protocol.  Maintain benzonatate  as is.  Switch to __________ 2 sprays each nostril daily.  Increase BroveX  to 5 cc b.i.d.  Will see the patient back in followup in a month.                                   Charlcie Cradle Delford Field, MD, FCCP   PEW/MedQ  DD:  05/04/2006  DT:  05/04/2006  Job #:  [id number]

## 2011-02-26 NOTE — Op Note (Signed)
[place]. [facility]  Patient:    [name], [name]                     MRN: [id number] Proc. Date: 08/25/00 Adm. Date:  [id number] Attending:  Caleb Popp CC:         Sidney Ace, M.D. [facility]  Orie Rout, M.D., [facility], ENT  [place]. [name], M.D.   Operative Report  INDICATIONS:  Evaluate for vocal cord dysfunction and parodoxical vocal cord motion, evaluate for upper airway obstruction.  PROCEDURE:  Bronchoscopy.  SURGEON:  Charlcie Cradle. Delford Field, M.D. [facility]  ANESTHESIA:  1% Xylocaine local.  PREOPERATIVE MEDICATIONS:  Demerol 30 mg IV push, Versed 3 mg IV push.  DESCRIPTION OF PROCEDURE:  The Olympus video bronchoscope was introduced through the right nares.  The upper airways were visualized.  The vocal cords were not edematous, however, with inspiration, there was a very slight degree of narrowing at the apex of the true vocal cords, however, this was not to a significant degree.  This was during inspiration.  During expiration, the vocal cords easily opened without evidence of overt obstruction.  The scope was then passed beyond the vocal cords into the upper airway.  The trachea and other bronchial anatomy was carefully examined.  No significant evidence of airway inflammation was identified and no evidence of tracheobronchitis was seen.  No evidence for upper airway obstruction was identified, specifically the trachea and right and left mainstem bronchi were widely patent.  No specimens were obtained.  COMPLICATIONS:  None.  IMPRESSION:  Mild vocal cord dysfunction with parodoxic motion during inspiration even under the effects of 3 mg of Versed, but only to a mild degree without evidence of significant upper airway inflammation consistent with good laryngopharyngeal reflux control with medication therapy and previous Nissen fundoplication.  No evidence of lower airway obstruction in the tracheobronchial tree and no  evidence of lower airway inflammation.  RECOMMENDATIONS:  Continue aggressive antireflux measures with current medical therapy.  No indication for asthma therapy and will consider sending the patient to a local speech language pathologist for vocal hygeine measures for the patients vocal cord dysfunction.  Also will give due consideration for psychiatric intervention as well for the patients anxiety syndrome.  Also will continue aggressive antitussive measures to prevent further vocal cord edema and irritation. DD:  08/25/00 TD:  08/25/00 Job: [id number]

## 2011-02-26 NOTE — H&P (Signed)
   NAMEMEKAYLAH, KLICH                          ACCOUNT NO.:  [id number]   MEDICAL RECORD NO.:  [id number]                   PATIENT TYPE:   LOCATION:                                       FACILITY:   PHYSICIAN:  Timothy P. Fontaine, M.D.           DATE OF BIRTH:   DATE OF ADMISSION:  DATE OF DISCHARGE:                                HISTORY & PHYSICAL   I discussed with the patient, the absolute irreversibility of sterility  associated with hysterectomy and they understand and accept this. I  discussed sexuality following hysterectomy to include orgasmic dysfunction  as well as persistent dyspareunia, all of which was understood and accepted.  Lastly, I discussed the choice of incision, midline versus Pfannenstiel and  they understand that at the time intraoperatively, after the patient is  prepared for surgery, that I will make the final decision as to which I feel  is the best incision and they understand and accept either choice. The  patient's questions are answered to her satisfaction and she is ready to  proceed with surgery.                                               Timothy P. Audie Box, M.D.    TPF/MEDQ  D:  11/14/2002  T:  11/14/2002  Job:  [id number]

## 2011-02-26 NOTE — Assessment & Plan Note (Signed)
[place] [facility]                             PULMONARY OFFICE NOTE   NAME:[name], [name] MATHIA                     MRN:          [id number]  DATE:09/08/2006                            DOB:          Nov 08, 1960    [name] returns today in followup complaining of increasing chest pain  in the upper chest, worse on inspiration. It is an ache-like pain, no  change with Tylenol, there is no cough, no wheeze or shortness of  breath. She has a history of mediastinal lymphadenopathy last checked in  October with no change in mediastinal nodes. Recent endoscopic  ultrasound biopsy of the nodes were negative for malignancy. The pain is  chronic but much worse now. She maintains Norvasc 10 mg daily, HCTZ 25  mg daily, atenolol 50 mg daily, Effexor 75 mg daily, potassium daily,  Zegerid 40 mg b.i.d., metoclopramide 10 mg q.i.d., benzonatate 2 t.i.d.,  Tussionex 1 teaspoon t.i.d., NovoLog daily, Yaz daily, bupropion 300 mg  daily, Brovex 5 mL b.i.d.   PHYSICAL EXAMINATION:  GENERAL:  This is an obese white female in no  distress.  VITAL SIGNS:  Temperature 97, blood pressure 120/74, pulse 108,  saturation 96% on room air.  CHEST:  Showed to be clear with no evidence of pseudowheeze or active  wheezing noted today.  CARDIAC:  Showed a regular rate and rhythm without S3, normal S1, S2.  ABDOMEN:  Soft, protuberant, bowel sounds active.  EXTREMITIES:  Showed no edema or clubbing.  SKIN:  Clear.  NEUROLOGIC:  Intact.  HEENT:  Show no jugular venous distention or lymphadenopathy. Oropharynx  clear.  NECK:  Supple.   IMPRESSION:  Mediastinal adenopathy with ongoing chest pain syndrome,  concern over progression of mediastinal nodes as a cause for chest pain  is raised.   PLAN:  Obtain a CTA of the chest on November 30. Once the results of  this are available, further recommendations will follow.     Charlcie Cradle Delford Field, MD, [facility]  Electronically Signed    PEW/MedQ   DD: 09/08/2006  DT: 09/09/2006  Job #: [id number]   cc:   Valetta Mole. Swords, MD

## 2011-02-26 NOTE — Assessment & Plan Note (Signed)
[facility]                             PULMONARY OFFICE NOTE   NAME:[name], [name]                     MRN:          [id number]  DATE:12/27/2006                            DOB:          Feb 21, 1961    [name] returns today in followup.  This is a 50 year old white  female with sarcoidosis with mediastinal involvement, reflux disease,  allergic rhinitis, and vocal cord dysfunction syndrome.  She is  breathing better and having decreased chest pain on prednisone therapy.  1. She is now down to 10 mg a day on prednisone.  2. Maintains Norvasc 10 mg daily.  3. Hydrochlorothiazide 25 mg daily.  4. Atenolol 25 mg daily.  5. Effexor 75 mg daily.  6. Potassium 40 mEq daily.  7. Zegerid 40 mg b.i.d.  8. Metoclopramide 10 mg t.i.d.  9. Benzonatate 10 mg t.i.d.  10.Tussionex 1 teaspoon t.i.d.  11.NovoLog 40/45/40 daily.  12.Bupropion 300 mg daily.  13.Brovex 5 cc b.i.d.  14.Vytorin 10/40 daily.  15.Qvar 2 sprays b.i.d. 80 mcg strength.  16.Prednisone 10 mg daily.  17.Viramist 2 sprays daily.   EXAM:  This is an obese female in no distress.  Temperature 98, blood pressure 124/80, pulse 86, saturation 96% on room  air.  CHEST:  Clear without evidence of wheeze, rales, or rhonchi.  CARDIAC:  Regular rate and rhythm without S3.  Normal S1, S2.  ABDOMEN:  Soft and nontender.  EXTREMITIES:  No edema or clubbing.  SKIN:  Clear.   IMPRESSION:  1. Sarcoidosis.  2. Mediastinal adenopathy.  3. Reflux disease.  4. Vocal cord dysfunction syndrome.  5. Allergic rhinitis.   PLAN:  Maintain prednisone 10 mg daily.  Attempt to taper Tussionex  until off.  Maintain other medicines as prescribed.  Will see the  patient back in 2 months.     Charlcie Cradle Delford Field, MD, [facility]  Electronically Signed    PEW/MedQ  DD: 12/27/2006  DT: 12/28/2006  Job #: [id number]   cc:   Valetta Mole. Swords, MD

## 2011-02-26 NOTE — H&P (Signed)
[name], [name]                          ACCOUNT NO.:  [id number]   MEDICAL RECORD NO.:  [id number]                   PATIENT TYPE:   LOCATION:                                       FACILITY:   PHYSICIAN:  Timothy P. Fontaine, M.D.           DATE OF BIRTH:   DATE OF ADMISSION:  DATE OF DISCHARGE:                                HISTORY & PHYSICAL   CHIEF COMPLAINT:  Increasing dysmenorrhea and menorrhagia.   HISTORY OF PRESENT ILLNESS:  The patient is a 50 year old [place], Para  II female with enlarging leiomyomata, increasing dysmenorrhea, and  menorrhagia for total abdominal hysterectomy. Alternatives were reviewed  with the patient and her husband to include more conservative approaches up  to and including hysterectomy. Child bearing is not an issue and she wants  to proceed with hysterectomy for definitive surgery. Ultrasonography shows a  large single enlarging myoma, initially at 62 mm in February. Now at 85 mm  in December. The patient is admitted at this time for total abdominal  hysterectomy.   PAST MEDICAL HISTORY:  Significant for hypertension, reflux, depression.   PAST SURGICAL HISTORY:  Includes hiatal hernia surgery, cholecystectomy,  stomach plication, appendectomy, bilateral tubal ligation and subsequent  tubal reanastomosis.   CURRENT MEDICATIONS:  Benzonate 100 mg 1-2 every four hours as needed,  Atenolol, Chloracol 50/25 one by mouth each day, Aciphex 20 mg two twice a  day, Ranitidine 300 mg each day, Effexor XR 75 mg each day, Metoclopramide  10 mg each meal and at bedtime, Zelnorm 2 mg two tabs twice a day, Norvasc  10 mg each day, Paxil 40 mg each day, and Tussionex cough syrup as needed.   ALLERGIES:  No known drug allergies.   REVIEW OF SYSTEMS:  None.   SOCIAL HISTORY:  Noncontributory.   PHYSICAL EXAMINATION:  VITAL SIGNS: Afebrile. Vital signs stable.  HEENT: Normal.  LUNGS: Clear.  CARDIAC: Regular rate and rhythm. Without murmur,  rub, or gallop.  ABDOMEN: Benign examination.  PELVIC: External genitalia and vagina normal. Uterus enlarged. Difficult to  palpate secondary to abdominal girth. Adnexa without gross masses or  tenderness.   ASSESSMENT:  A 50 year old [place] II, Para II female with enlarging  leiomyomata, increasing dysmenorrhea and menorrhagia, for total abdominal  hysterectomy. I reviewed with the patient and her husband, the expected  intraoperative postoperative course. The ovarian conservation issue was  reviewed with her and the option of keeping both ovaries with the risk of re-  operation for problems with her ovaries in the future such as pain or benign  cystic changes as well as ovarian cancer and the risks of removing them and  facing the hormone replacement issue were all reviewed with them. The  patient desires to keep both ovaries, although gives me permission to remove  one or both if significant disease is encountered. The immediate  intraoperative postoperative risks are reviewed to include bleeding,  transfusion, with risk of transfusion reaction, hepatitis, HIV, mad cow  disease, and other unknown entities reviewed. Infections both inside  requiring prolonged antibiotics, abscess formation requiring opening and  draining of abscesses, wound complications requiring opening and draining of  incision and closure by secondary and intentional were all reviewed with  them. The risks of inadvertent injury to internal organs including bowel,  bladder, ureters, vessels, and nerves necessitating major exploratory repair  with surgery in the future and surgery including ostomy formation, ureteral  reconstruction were all reviewed, understood, and accepted. The risks of  thrombosis, deep vein thrombosis, pulmonary embolism, were also reviewed  with them. I anticipate hospitalization for two days. Will plan on PAF,  thigh high stockings and prophylactic antibiotics.                                                Timothy P. Audie Box, M.D.    TPF/MEDQ  D:  11/14/2002  T:  11/14/2002  Job:  [id number]

## 2011-02-26 NOTE — Assessment & Plan Note (Signed)
[facility]                             PULMONARY OFFICE NOTE   NAME:Barthelemy, NOEL HENANDEZ                     MRN:          [id number]  DATE:10/20/2005                            DOB:          02-21-1961    Ms. Arps returns today in followup, and is complaining of continued  chest pain, soreness, shortness of breath, though is fair.  She is  having significant fatigue.  She has history of cyclic cough and vocal  cord dysfunction syndrome.   Maintains:  1. Norvasc 10 mg daily.  2. Hydrochlorothiazide 25 mg daily.  3. Atenolol 25 mg daily.  4. Effexor 75 mg daily.  5. Potassium 40 mg mEq daily.  6. Zegerid 40 mg b.i.d.  7. Metoclopramide 10 mg t.i.d.  8. Benzonatate 200 mg t.i.d.  9. Tussionex 1 teaspoon 3 times daily.  10.NovoLog 35 a.m., 30 p.m., 50 nightly units.  11.YAZ daily.  12.Bupropion 300 mg daily.  13.Brove X 5 cc b.i.d.  14.Vytorin 10/40 daily.   EXAMINATION:  This is an obese, white female, in no distress.  Temperature showed 98.  Blood pressure 130/80.  Pulse 90.  Saturation  98% on room air.  CHEST:  Showed diminished breath sounds with no evidence of wheeze or  rhonchi.  CARDIAC EXAM:  Showed a regular rate and rhythm without S3.  Normal S1  and S2.  ABDOMEN:  Soft and non-tender.  EXTREMITIES:  Showed no edema or clubbing.  SKIN:  Was clear.  NEUROLOGIC EXAM:  Was intact.  HEENT EXAM:  Showed no jugular venous distention.  No lymphadenopathy.  Oropharynx clear.  NECK:  Supple.   IMPRESSION:  Mediastinal lymphadenopathy with persisting symptoms of  chest pain and shortness of breath.  Rule out sarcoidosis.   PLAN:  Obtain an ACE level, CMET with calcium levels and liver function  profile.  Refer to Dr. Karle Plumber for mediastinoscopy.  We are in  need of tissue sampling of the lymph nodes in this case.     Charlcie Cradle Delford Field, MD, [facility]  Electronically Signed    PEW/MedQ  DD: 10/20/2006  DT: 10/20/2006  Job #:  [id number]   cc:   Valetta Mole. Cato Mulligan, MD  Ines Bloomer, M.D.

## 2011-02-26 NOTE — Assessment & Plan Note (Signed)
[facility]                               PULMONARY OFFICE NOTE   NAME:Schulte, KAMALI SAKATA                     MRN:          [id number]  DATE:07/07/2006                            DOB:          April 03, 1961    Ms. Mccullar is a 50 year old white female with a history of vocal cord  dysfunction syndrome, reflux disease, allergic rhinitis, type 2 diabetes,  obesity, obstructive sleep apnea.  Now has mediastinal lymphadenopathy.  Had  a negative endoscopic ultrasonography and transesophageal needle aspiration  earlier this month.  She now has a dry cough, continued to have hoarseness.  No heartburn.  Some dyspnea.  Some chest discomfort.  Some sharp chest pain.   The patient maintains  1. BroveX 5 cc daily.  2. Tussionex 1 teaspoon b.i.d.  3. Benzonatate 200 mg t.i.d.  4. Metoclopramide 10 mg q.i.d.  5. Zegerid 40 mg b.i.d.  Other maintenance medicines listed in the chart are correct as reviewed.   ON EXAM:  GENERAL:  This is an obese white female in no distress.  VITAL SIGNS:  Temperature 98.  Blood pressure 118/76.  Pulse 78.  Saturation  98% on room air.  CHEST:  Showed to be clear without evidence of wheeze or rhonchi.  CARDIAC EXAM:  Showed a regular rate and rhythm without S3.  Normal S1, S2.  ABDOMEN:  Soft, nontender.  EXTREMITIES:  Showed no edema or clubbing.  SKIN:  Clear.  NARES:  Showed mild nasal inflammation.  Postnasal pharyngeal drainage is  noted.  CHEST:  Clear without evidence of wheeze or rhonchi.   IMPRESSION:  1. Allergic rhinitis.  2. Reflux disease.  3. Vocal cord disease syndrome.  4. No true asthma.  5. Possibly mild sinusitis.   PLAN:  The patient was to complete the course of Avelox that the patient was  given by Dr. Cato Mulligan.  The patient is also to complete the course of  Pulmicort nebs given by Dr. Cato Mulligan.  The patient will increase BroveX to 5  cc b.i.d. p.r.n.  Will receive a Depo-Medrol injection, 80 mg IM, and  will  continue other maintenance medicines as prescribed.  As for the mediastinal  adenopathy, I do not believe it has anything to do with her other current  symptoms complex.  It is likely benign in nature.  She will have a follow-up  CT scan in October.       Charlcie Cradle Delford Field, MD, FCCP      PEW/MedQ  DD:  07/07/2006  DT:  07/08/2006  Job #:  [id number]   cc:   Valetta Mole. Swords, MD

## 2011-02-26 NOTE — Discharge Summary (Signed)
NAMEALETHEA, TERHAAR              ACCOUNT NO.:  [id number]   MEDICAL RECORD NO.:  [id number]          PATIENT TYPE:  INP   LOCATION:  4704                         FACILITY:  [facility]   PHYSICIAN:  Rene Paci, M.D. LHCDATE OF BIRTH:  May 01, 1961   DATE OF ADMISSION:  10/25/2005  DATE OF DISCHARGE:  10/26/2005                                 DISCHARGE SUMMARY   DISCHARGE DIAGNOSIS:  1.  Atypical chest pain.  2.  Mild leukocytosis, questionable urinary tract infection.  3.  History of gastroesophageal reflux disease status post Nissen      fundoplication.   HISTORY OF PRESENT ILLNESS:  The patient is a 50 year old female who  reported becoming increasingly diaphoretic yesterday evening with nausea,  headache, and crushing chest pain which is radiating to the left arm.  She  also noted some tingling in the fingers of the left hand.  This became  progressively worse on the day of admission and is accompanied by shortness  of breath prompting her ER visit.   PAST MEDICAL HISTORY:  1.  Mild obstructive sleep apnea.  2.  Anxiety.  3.  Vocal cord dysfunction.  4.  Severe gastroesophageal reflux disease.  5.  Diabetes type 2.  6.  Morbid obesity.  7.  Hypertension.  8.  Hypercholesterolemia.  9.  Irritable bowel syndrome.   HOSPITAL COURSE:  Problem 1:  Atypical chest pain.  The patient was admitted and she underwent  serial cardiac enzymes which were negative x 4.  In addition, D-dimer was  sent which was also negative.  D-dimer was less than 0.22.  Serial cardiac  enzymes were negative.  Of note, the patient reports worsening of these  types of symptoms after large meals.  She was seen by Dr. Marina Goodell from GI  secondary to GERD and she has a history of a Nissen fundoplication in 2003.  She may need further GI evaluation and was instructed to contact Dr. Marina Goodell  for a follow up appointment.   Problem 2:  Mild leukocytosis, questionable UTI.  The patient was noted to  have mild  leukocytosis with a value of 12.7.  She was noted to have 3-6  white blood cells and trace leukocytes on urinalysis.  She will be given a  short course of Cipro for probable UTI.  Culture is still pending at the  time of this dictation.   In addition, the patient is followed by Dr. Julieanne Manson from [facility].  She is noted to have cholesterol level of 154 with elevated  triglycerides of 427 and a depressed HDL of 24.  She is instructed to also  follow up with Dr. Clarene Duke and may benefit from outpatient stress test, will  defer to cardiology.   DISCHARGE MEDICATIONS:  K-Dur 40 mEq p.o. daily, Benzoate 100 mg p.o.  b.i.d., Cipro 500 mg p.o. b.i.d. for three days, Avandia 2 mg p.o. daily,  Metformin 1000 mg p.o. daily, Glipizide 5 mg p.o. daily, Tenoretic 50/25 one  tab p.o. daily, Reglan 10 mg p.o. before meals and at bedtime, HCTZ 25 mg  p.o. daily, Norvasc 5  mg p.o. daily, Yaz 3 mg p.o. daily, Zygaret 40/1100  once daily as before, Ultram 50 mg four times daily as needed, Effexor XR 75  mg p.o. daily.   DISCHARGE LABORATORY DATA:  Hemoglobin 12.6, hematocrit 35.8, white blood  cell count 12.7, platelets 372, BUN 11, creatinine 0.8.   FOLLOW UP:  The patient is instructed to follow up with Dr. Birdie Sons in  1-2 weeks, Dr. Julieanne Manson in 1-2 weeks, and Dr. Marina Goodell from GI.  She is  instructed to call for an appointment.  The patient is instructed to call  Dr. Cato Mulligan or go to  the emergency room  should she develop worsening chest  pain or shortness of breath.      Melissa S. Peggyann Juba, NP      Rene Paci, M.D. [facility]  Electronically Signed    MSO/MEDQ  D:  10/26/2005  T:  10/26/2005  Job:  [id number]   cc:   Thereasa Solo. Little, M.D.  Fax: [phone]   Valetta Mole. Swords, M.D. [facility]  [address] [place]  [place] [address]   Wilhemina Bonito. Marina Goodell, M.D. [facility]  [address]  [place]  [place] [address]

## 2011-02-26 NOTE — Discharge Summary (Signed)
[name], [name]                        ACCOUNT NO.:  [id number]   MEDICAL RECORD NO.:  [id number]                   PATIENT TYPE:  INP   LOCATION:  3040                                 FACILITY:  [facility]   PHYSICIAN:  Rene Paci, M.D. [facility]          DATE OF BIRTH:  Nov 30, 1960   DATE OF ADMISSION:  08/18/2003  DATE OF DISCHARGE:  08/19/2003                                 DISCHARGE SUMMARY   DISCHARGE DIAGNOSES:  1. Anxiety.  2. Dyspnea.   HISTORY OF PRESENT ILLNESS:  The patient is a 50 year old white female with  a long history of vocal cord dysfunction. She has had an extensive workup  both in [place] and at [facility].  On the day of  admission, she states that she had a several-day history of increasing  tightness in her throat associated with shortness of breath, wheezing.  She  presented to the emergency room where her dyspnea was eventually relieved  with 7 mg of Ativan, IV morphine, Tussionex, etc.  The patient was admitted  for refractory dyspnea and vocal cord dysfunction.   PAST MEDICAL HISTORY:  1. History of refractory vocal cord dysfunction, evaluated by multiple     pulmonologists including Dr. Delford Field and several ENT specialists including     speech pathology workups at [facility].  2. Severe gastroesophageal reflux disease, status post Nissen     fundoplications x2. The redo procedure was in June of 2003.  3. Irritable bowel syndrome.  4. Adult onset diabetes mellitus.  5. Morbid obesity.  6. Hypertension.  7. Hypercholesterolemia.  8. Severe anxiety.  9. Supracervical hysterectomy, left ovary and tube, and right tube removed     in February of 2004, secondary to fibroids.  10.      Status post cholecystectomy.  11.      Status post appendectomy.  12.      History of a ventral hernia repair in 2003.   HOSPITAL COURSE:  Problem 1.  Pulmonary.  The patient presented with  refractory dyspnea and vocal  cord dysfunction without evidence of hypoxemia  or respiratory insufficiency.  There was no other workup to do and  symptomatically she states she feels better.  O2 saturations remained  greater than 95% during this admission.   Problem 2.  Hypokalemia.  This is being replaced.   DISCHARGE LABORATORY DATA:  Potassium 3, hemoglobin A1C 6%.  TSH 2.634.  Sed  rate 43.   DISCHARGE MEDICATIONS:  Resume home medications which are as follows:  1. Aciphex 20 mg three tablets b.i.d.  2. Ranitidine 300 mg q.h.s.  3. Reglan 10 mg p.o. q.h.s.  4. Zelnorm 2 mg two tablets b.i.d.  5. Norvasc 10 mg daily.  6. Tenoretic 50/12.5 mg daily.  7. K-Dur 20 mEq b.i.d.  8. Effexor XR 75 mg daily.  9. Paxil 40 mg daily.  10.      Avandamet 1  gram/2 b.i.d.  11.      Lipitor 40 mg q.h.s.  12.      Tessalon Perles and Tussionex as at home.   FOLLOW UP:  Follow up with Dr. Cato Mulligan in two to three weeks.      Cornell Barman, P.A. [facility]                  Rene Paci, M.D. [facility]    LC/MEDQ  D:  08/19/2003  T:  08/19/2003  Job:  [id number]   cc:   Shan Levans, M.D. [facility]   Bruce H. Swords, M.D. [facility]

## 2011-02-26 NOTE — Assessment & Plan Note (Signed)
[facility]                         GASTROENTEROLOGY OFFICE NOTE   NAME:Drennen, AARVI STOTTS                     MRN:          [id number]  DATE:09/23/2006                            DOB:          1960-12-01    REFERRING PHYSICIAN:  Dr. Shan Levans.   REASON FOR CONSULTATION:  Chest pain.   HISTORY:  This is a 50 year old female with morbid obesity,  hypertension, vocal cord dysfunction, chronic cough, gastroesophageal  reflux disease status post fundoplication, and type 2 diabetes mellitus.  She is now referred regarding chest pain.  The patient has been  evaluated in the past for chest pain.  Last GI evaluation for that  complaint was in January of 2007.  At that time, her symptoms were felt  to be most consistent with a musculoskeletal etiology.  Since that time,  she was seen by her cardiologist.  Her pain was not felt to be cardiac.  She was, however, noted to have enlarged mediastinal lymph nodes, for  which she underwent upper endoscopy with endoscopic ultrasound and  biopsies in September 2007.  The esophageus, stomach, and duodenum were  normal.  Aspiration of the lymph nodes was benign.  As her lymph nodes  were not felt to be cause for her chest pain, she is referred to this  office.  Careful questioning finds her pain to be the same as it was  earlier in the year.  In particular, this pain is most prominent with  certain activities, such as lifting her child or applying direct  pressure.  There is no change in symptoms with meals.  She has been on  Zegerid 40 mg b.i.d., and metoclopramide 10 mg t.i.d. for reflux.  On  this regimen, she does have occasional breakthrough symptoms.  She  continues to struggle with her weight.   She has no known drug allergies.   CURRENT MEDICATIONS:  Listed as Norvasc 10 mg daily, hydrochlorothiazide  25 mg daily, atenolol 50 or 25 mg daily, Effexor XR 75 mg daily,  potassium chloride 40 mEq daily,  Zegerid 40 mg b.i.d., metoclopramide 10  mg t.i.d., benzoate 200 mg t.i.d., Tussionex t.i.d., NovoLog insulin as  directed 3 times daily, Yaz, Brovex, Vytorin 10/40 mg daily, ___ XL 300  mg daily.  She also uses lorazepam, Astelin, and viramist p.r.n.   PHYSICAL EXAM:  Somewhat depressed-appearing female in no acute  distress.  Blood pressure 126/82, heart rate is 80 and regular.  Weight is 265  pounds.  HEENT:  Sclerae anicteric, conjunctivae pink.  Oral mucosa is intact.  There is no thrush.  LUNGS:  Clear.  HEART:  Regular.  ABDOMEN:  Obese and soft without tenderness, mass, or hernia.   IMPRESSION:  1. Chest pain.  Symptoms most consistent with musculoskeletal      etiology.  2. Mediastinal adenopathy and chronic cough, under the care of Dr.      Delford Field.  3. Gastroesophageal reflux disease status post fundoplication.  As      well, on high-dose proton pump inhibitor and prokinetic therapy.  4. Multiple general medical problems.   RECOMMENDATIONS:  1.  No change in GI regimen recommended.  2. Prescribe Darvocet as needed for pain relief.  3. GI followup p.r.n.     Wilhemina Bonito. Marina Goodell, MD  Electronically Signed    JNP/MedQ  DD: 09/25/2006  DT: 09/25/2006  Job #: [id number]   cc:   Charlcie Cradle. Delford Field, MD, FCCP  Bruce H. Swords, MD  Thereasa Solo. Little, M.D.

## 2011-02-26 NOTE — H&P (Signed)
[place]. [facility]  Patient:    [name], BRUNSMAN Visit Number: [id number] MRN: [id number]          Service Type: MED Location: [phone] Attending Physician:  Corwin Levins Dictated by:   Corwin Levins, M.D. [facility] Admit Date:  11/24/2001   CC:         Bruce H. Swords, M.D. [facility]  Julieanne Manson, M.D.  Charlcie Cradle Delford Field, M.D. [facility]   History and Physical  CHIEF COMPLAINT:  Chest pain on and off for five days.  HISTORY OF PRESENT ILLNESS:  The history is per the patient.  No old chart. [name] is a 50 year old white female who presents with chest pain on and off for the last five days, described as more or less more of a dull pressure mid-substernal, increasing with exertion but not pleuritic, associated with shortness of breath, nausea, sweating, and radiation to the left arm.  Using the vacuum made it worse.  There is some tenderness in the anterior chest wall and baseline severe anxiety problems.  She has also has known pulmonary nodules, due to follow up apparently with a chest CT in April 2003, with Dr. Charlcie Cradle. Delford Field.  She had a similar discomfort in the chest approximately three years ago.  Apparently had an abnormal stress test, but subsequent cardiac catheterization was normal per the patient.  No history of a deep venous thrombosis or pulmonary embolus.  PAST MEDICAL HISTORY 1. History of GERD, status post Nissen. 2. History of vocal cord dysfunction. 3. Sever anxiety. 4. Hypertension. 5. History of pulmonary nodule, unclear details. 6. Morbid obesity.  PAST SURGICAL HISTORY 1. Status post ventral hernia repair. 2. Status post cholecystectomy. 3. Status post appendectomy.  ALLERGIES:  No known drug allergies.  CURRENT MEDICATIONS 1. Protonix 40 mg p.o. q.d. 2. Norvasc 10 mg p.o. q.d. 3. Paxil 40 mg p.o. q.d. 4. Reglan 10 mg q.i.d. 5. _____________ on a p.r.n. basis.  SOCIAL HISTORY:  No tobacco, no alcohol.  Lives in  [place] with her husband.  Currently on medical leave from [employer].  FAMILY HISTORY:  Significant for heart disease and diabetes mellitus.  REVIEW OF SYSTEMS:  She denies specifically any GU symptoms.  She states her blood sugar was 224 earlier this evening by a Glucometer at her fathers house, which is brand new for her.  PHYSICAL EXAMINATION  GENERAL:  [name] is a 50 year old white female, massively obese.  She is severely anxious.  VITAL SIGNS:  Blood pressure 147/70, heart rate 111, respirations 24, afebrile.  O2 saturation is 94% on room air.  HEENT:  Sclerae clear.  Tympanic membranes clear.  Pharynx benign.  NECK:  Without lymphadenopathy, jugular venous distention.  CHEST:  No rales or wheezing.  CARDIAC:  A regular rate and rhythm.  No murmur.  ABDOMEN:  Soft, nontender.  Positive bowel sounds.  No organomegaly or masses.  EXTREMITIES:  No edema.  There is some tenderness to the right mid-parasternal area.  NEUROLOGIC:  Cranial nerves II-XII intact, alert, nonfocal.  LABORATORY DATA:  White blood cell count 13.7, hemoglobin 11.5.  CMET with glucose 111.  Liver function tests and electrolytes, BUN, and creatinine otherwise normal.  Urinalysis with moderate leukocyte esterase and 7-10 white blood cells, otherwise negative.  No glucose or protein.  CPK-MB 37 and 0.7, troponin I of 0.01.  Chest x-ray:  No active disease, borderline cardiomegaly.  Electrocardiogram:  Sinus rhythm, no acute  changes.  ASSESSMENT/PLAN 1. Chest pain.  Overall it is thought this is probably musculoskeletal in    the setting of severe anxiety, but given the associated symptoms, cannot    confidently rule out cardiac or pulmonary embolus.  PLAN:  She is to be admitted to telemetry and given O2.  Rule out a myocardial infarction with cardiac enzymes.  Given her previously-abnormal stress test, she likely need to follow up with either  Dr. Valetta Mole. Swords or Dr. Julieanne Manson as an outpatient, for consideration of a further evaluation.  2. Pulmonary nodules.  PLAN:  Check a chest CT to follow up the nodules and rule out a pulmonary embolus.  3. Hypertension. 4. Gastroesophageal reflux disease, status post a Nissen procedure, on a    proton pump inhibitor, doubt the current source of pain. 5. Questionable urinary tract infection.  PLAN:  Check a urine culture.  6. Questionable diabetes mellitus.  PLAN:  Will check a hemoglobin A1c and CBG for 48 hours. Dictated by:   Corwin Levins, M.D. [facility] Attending Physician:  Corwin Levins DD:  11/25/01 TD:  11/25/01 Job: [id number]

## 2011-02-26 NOTE — Assessment & Plan Note (Signed)
 HEALTHCARE                             PULMONARY OFFICE NOTE   NAME:[name], [name]                     MRN:          [id number]  DATE:01/17/2007                            DOB:          1960/12/25    [name] is a 50 year old, white female, history of morbid obesity,  sarcoidosis, mediastinal adenopathy.  She is having increased chest  pain, nonproductive cough, fatigue, increased dyspnea.  Symptoms are  worse over the last three to four days.  Her prednisone has been down to  10 mg for the last two weeks.  She was on Tussionex but is now fully off  this.  She maintains QVAR 80 mcg strength, two sprays b.i.d., prednisone  10 mg a day, Veramyst daily two sprays each nostril, amlodipine 10 mg  daily, atenolol/chlorthalidone 50/25 one and a half daily, benzonatate  200 mg twice daily, bupropion 300 mg daily, Effexor 75 mg daily,  hydrochlorothiazide 25 mg daily, metoclopramide 10 mg four three times  daily, NovoLog sliding scale, potassium 20 mEq b.i.d., Vytorin 10/40  daily, Zegerid 40 mg daily, Brovex once daily.   PHYSICAL EXAMINATION:  GENERAL:  This is a morbidly obese female in no  distress.  VITAL SIGNS:  Temperature 98.  Blood pressure 120/76.  Pulse 70.  Saturation was 96 percent on room air.  CHEST:  Distant breath sounds with no evidence of wheeze or rhonchi.  CARDIAC:  Regular rate and rhythm without S3.  Normal S1, S2.  ABDOMEN:  Protuberant.  __________   IMPRESSION:  Sarcoidosis, mild flare, increased chest pain.   PLAN:  Begin pulsed prednisone 40 mg per day with slow taper back to  __________. No other treatment plan changes today. __________.     Charlcie Cradle Delford Field, MD, [facility]  Electronically Signed    PEW/MedQ  DD: 01/18/2007  DT: 01/18/2007  Job #: [id number]   cc:   Valetta Mole. Swords, MD

## 2011-02-26 NOTE — Consult Note (Signed)
NAMEBETZABE, BEVANS                        ACCOUNT NO.:  [id number]   MEDICAL RECORD NO.:  [id number]                   PATIENT TYPE:  EMS   LOCATION:  ED                                   FACILITY[facility]  [facility]   PHYSICIAN:  Charlaine Dalton. Sherene Sires, M.D. [facility]           DATE OF BIRTH:  18-Dec-1960   DATE OF CONSULTATION:  07/26/2002  DATE OF DISCHARGE:  07/25/2002                                   CONSULTATION   REASON FOR CONSULTATION:  Refractory wheezing.   HISTORY OF PRESENT ILLNESS:  This is a 50 year old white female well-known  to the pulmonary service, followed by Shan Levans, M.D., with classic  VCD, status post redo Nissen fundoplication in June 2003, with continued  episodes of paroxysms of dyspnea associated with a hacking, dry upper airway  cough and prominent wheezing.  We have not recently felt that she had any  active asthma.  She was seen by Dr. Delford Field most recently on 07/05/02.  She  was seen on 9/25, status post Nissen fundoplication in June 2003, with  continued episodes of reflux, on Aciphex at 40 mg b.i.d. and Reglan at 10 mg  q.i.d., and therefore Zelnorm was added to the regimen at 4 mg b.i.d.  She  had no more symptoms until the yesterday afternoon, when she was in church  and stated she began coughing and then abruptly developed dyspnea with  severe wheezing and was brought to the emergency room.  In the emergency  room she has been treated with multiple nebulizers, Decadron, IV Decadron,  magnesium sulfate, and I was asked to see her because her wheezing did not  appear to respond to treatment.   When I arrived in the room, you could hear her wheezing from across the  emergency room, but when I asked her to quit making that noise with your  voice so I can talk to you for a minute, the wheezing abruptly stopped.  In  fact, it also stops with purse-lip maneuver but as soon as she stops purse-  lip maneuver and does not concentrate on the purse-lip maneuver or  talking,  she is wheezing again.   She denies any sputum production, chest pain, fevers, chills, bouts of PND,  or leg swelling.   PAST MEDICAL HISTORY:  1. GERD, status post two Nissen fundoplications, the most recent in June     2003.  2. Severe anxiety.  3. Hypertension.  4. Morbid obesity.  5. Status post cholecystectomy and appendectomy.   ALLERGIES:  None known.   MEDICATIONS:  Presently she is on Paxil 40 mg per day, Norvasc 10 mg per  day, metoclopramide 10 mg q.i.d., benzoate 100 mg two tablets three times a  day, and Tussionex 5 cc t.i.d., iron tablets two tablets three times a  day, Aciphex 40 mg b.i.d. (apparently she was taking this after meals,  however), Zelnorm 2 mg one b.i.d.   SOCIAL HISTORY:  She has never smoked.  She denies any alcohol use.  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   FAMILY HISTORY:  Negative for respiratory disease, positive for diabetes and  heart disease.   REVIEW OF SYSTEMS:  Taken in detail and essentially negative except as noted  above.   PHYSICAL EXAMINATION:  GENERAL:  This is an anxious white female who is as  described, afebrile, with normal vital signs.  HEENT:  Oropharynx is clear.  I do not see any evidence of postnasal  drainage or erythema.  Dentition is intact.  Nasal turbinates normal.  Ear  canals are clear bilaterally.  CHEST:  Lung fields perfectly clear bilaterally to auscultation and  percussion once she did purse-lip maneuver.  CARDIAC:  Regular rate and rhythm without murmur, gallop, or rub present.  ABDOMEN:  Obese, soft, benign, with no palpable organomegaly, masses, or  tenderness.  EXTREMITIES:  Warm without calf tenderness, cyanosis, clubbing, or edema.   LABORATORY DATA:  Hemoglobin saturation is adequate on room air.   Chest x-ray is normal.   IMPRESSION:  Refractory wheeze classic for vocal cord dysfunction syndrome  in this patient with redo  Nissen fundoplication in August 2003 and major  issues related to anxiety.  Although I am not convinced reflux is the  mechanism driving her present exacerbation, my understanding is that Aciphex  works better if taken 30 minutes before meals, and I have asked her to  switch this.  I have also added Zantac 300 mg q.h.s. empirically.   In addition, since her wheezing appears to be under voluntary control, I  spent extra time with her teaching purse-lip maneuver, which she appeared to  master until I walked out of the room and then began wheezing again.  To  help her with this, I am going to recommend flutter valve training today  (Dr. Delford Field informs me that she has flutter valves at home but she did not  recognize the term when I asked her about it).  We will have her trained in  flutter valve use and document that she has received it today for follow-up  in the office by Dr. Delford Field.   I do not recommend any other changes in medication and continue to feel that  this patient's asthma and wheezing are entirely explained by vocal cord  dysfunction syndrome with major functional components but feel that once she  receives her flutter valve training here in the ER, she should be discharged  to follow up with Dr. Delford Field.                                                Charlaine Dalton. Sherene Sires, M.D. [facility]    MBW/MEDQ  D:  07/27/2002  T:  07/28/2002  Job:  [id number]

## 2011-02-26 NOTE — Op Note (Signed)
. [facility]  Patient:    [name], [name]                     MRN: [id number] Proc. Date: 12/09/99 Adm. Date:  [id number] Attending:  Abigail Miyamoto CC:         [name]. [name], M.D.             Charlcie Cradle Delford Field, M.D. [facility]             Robert K. Abigail Miyamoto, M.D.                           Operative Report  PROCEDURE:  Esophagogastroduodenoscopy.  INDICATIONS:  Dysphagia.  HISTORY:  This is a 50 year old female with a history of reflux disease, who is  status post open Nissen fundoplication in August 2000.  She was recently evaluated in the office for dysphagia. (see that dictation for details)  She is now admitted for endoscopy.  The nature of the procedure, as well as the  risks, benefits and alternatives were reviewed. The patient seemed to understand agreed to proceed.  PHYSICAL EXAMINATION:  GENERAL:  A well-appearing female, in no acute distress.  She is alert and oriented  VITAL SIGNS:  Stable.  LUNGS:  Clear.  HEART:  Regular.  ABDOMEN:  Obese and soft.  DESCRIPTION OF PROCEDURE:  After informed consent was obtained, the patient was  sedated with 80 mg of Demerol, 9 mg of Versed IV.  The Olympus endoscope was passed orally under direct vision into the esophagus.  The esophagus was normal throughout, no evidence of esophagitis or obstructing lesions.  No evidence of stricture.  The gastroesophageal junction was in normal anatomic position. Minimal resistance was required to enter the stomach.  The stomach revealed evidence of  intact wrap.  No other abnormalities.  The duodenal bulb and posterior bulb and  duodenum were normal.  IMPRESSION:  Normal upper endoscopy, post-fundoplication.  Fundoplication wrap intact.  I expect her symptoms or sensation is secondary to her wrap and will improve with time.   I provided reassurance.  No other therapies or medications  indicated at this time.  RECOMMENDATIONS:  Follow  up p.r.n. DD:  12/09/99 TD:  12/09/99 Job: [id number]

## 2011-02-26 NOTE — Consult Note (Signed)
[place]. [facility]  Patient:    [name], [name]                     MRN: [id number] Proc. Date: 11/30/00 Adm. Date:  [id number] Attending:  Cathren Laine CC:         Osvaldo Human, M.D.  Charlcie Cradle Delford Field, M.D. [facility]  John N. Eda Keys., M.D. [facility]  Dr. Quita Skye; [facility]   Consultation Report  HISTORY OF PRESENT ILLNESS:  I have been asked to see this 50 year old female who is well known to our service with GERD, VCD, and upper airway irritability syndrome.  She has been seen by multiple physicians including the voice disorder center at [facility] and speech therapy as well as gastroenterologists and multiple pulmonologists.  She has a very large anxitic component to her disease.  The patient presents after awakening at approximately 4 a.m. this morning but she has done this on two consecutive mornings with a feeling of choking/tickling which leads to intractable cough.  This then sets off her vocal cord dysfunction and leads to dyspnea and severe anxiety.  She presented to the emergency room today where her saturations have been excellent and neck films were done that were normal.  The patient is managed on b.i.d. protime pump inhibitor and was on Zantac at night, but has stopped this the last two weeks on recommendations by her pulmonologist at [facility]. Apparently, a pH probe has not shown acid breakthrough.  However, the patient sounds as though she may be having alkaline reflux.  PAST MEDICAL HISTORY: 1. Vocal cord dysfunction with pseudo wheezing, severe anxiety. 2. Upper airway irritability syndrome. 3. History of gastroesophageal reflux disease.  SOCIAL HISTORY:  She is married and her husband is here and is very supportive.  She does not smoke.  FAMILY HISTORY:  Noncontributory.  REVIEW OF SYSTEMS:  As per history of present illness.  PHYSICAL EXAMINATION  GENERAL:  She is an obese, white female in mild  respiratory distress. However, she has severe vocal cord dysfunction with pseudo wheezing.  There is no strider.  VITAL SIGNS:  Blood pressure 149/94, pulse 97, temperature 96.2, respiratory rate approximately 28-30, O2 saturation on room air 97%.  HEENT:  Pupils are equal, round and reactive to light and accommodation. Extraocular muscles are intact.  Nares are patent without discharge. Oropharynx is clear.  NECK:  Supple without JVD or lymphadenopathy.  No palpable thyromegaly.  CHEST:  Totally clear to auscultation.  There is very prominent pseudo wheezing in her laryngeal area.  CARDIAC:  Regular rate and rhythm.  No murmurs, rubs, or gallops.  ABDOMEN:  Soft, nontender with good bowel sounds.  GENITAL:  Not done and not indicated.  BREASTS:  Not done and not indicated.  RECTAL:  Not done and not indicated.  EXTREMITIES:  Lower extremities are without edema.  Good pulses distally.  No calf tenderness.  NEUROLOGIC:  She is alert and oriented x 3.  No obvious motor deficits.  LABORATORIES:  Neck films showed no acute pathology.  IMPRESSION:  Severe vocal cord dysfunction with cough paroxysms that I suspect is being exacerbated by gastroesophageal reflux disease.  The patient is on b.i.d. protime pump inhibitors but this does not exclude alkaline reflux. The patient gives a classic history for early morning reflux that is setting of her cough and vocal cord dysfunction.  She is on b.i.d. protime pump inhibitor but I think she would benefit from a  promotility agent such as Reglan.  She also needs better control of her anxiety.  PLAN: 1. Will initiate Reglan at 10 mg a.c. and h.s. 2. The patient has cough suppression at home in the form of Mepergan Forte and    also Tussionex.  She is to use this to keep the cough suppressed and to    have strict voice rest. 3. Relaxation techniques are to be used as taught to her by speech therapy.    The husband states that she really  has not been very compliant with this.    The patient is trying to get in to see Dr. Quita Skye at [facility].  I have asked her to do so. DD:  12/02/00 TD:  12/03/00 Job: [id number]

## 2011-02-26 NOTE — Procedures (Signed)
[name], [name]              ACCOUNT NO.:  [id number]   MEDICAL RECORD NO.:  [id number]          PATIENT TYPE:  OUT   LOCATION:  SLEEP CENTER                 FACILITY:  [facility]   PHYSICIAN:  Marcelyn Bruins, M.D. [facility] DATE OF BIRTH:  [birth year]   DATE OF STUDY:  07/26/2005                              NOCTURNAL POLYSOMNOGRAM   REFERRING PHYSICIAN:  Bruce H. Swords, M.D. [facility].   INDICATION FOR STUDY:  Persistent disorder of initiating and maintaining  wakefulness.   EPWORTH SLEEPINESS SCORE:  12.   SLEEP ARCHITECTURE:  The patient had a total sleep time of 392 minutes with  decreased slow wave sleep but adequate REM. Sleep onset latency was normal  at 21 minutes, however, REM onset was somewhat prolonged at 135 minutes.  Sleep efficiency was fairly good at 92%.   RESPIRATORY DATA:  The patient underwent a split night protocol where she  was found to have 41 obstructive events in the first 164 minutes of sleep.  This gave the patient a respiratory disturbance index of 15 events per hour  and O2 desaturation as low as 87%. It should be noted the patient slept  nearly the whole night in the supine position and was found to have moderate  snoring. By protocol, the patient was then placed on a Respironics nasal  mask and C-PAP titration was initiated. At a final pressure of 8 cm, there  was excellent control of both obstructive events and snoring with a  significant REM time being noted.   OXYGEN DATA:  The patient had O2 desaturation as low as 87% associated with  her obstructive events.   CARDIAC DATA:  Rare PVCs were noted.   MOVEMENT/PARASOMNIA:  The patient was found to have 302 leg jerks with three  per hour causing arousal or awakening. This is clinically significant,  however, the leg jerks greatly decreased in frequency by the time the  patient reached optimal C-PAP pressure.   IMPRESSION/RECOMMENDATIONS:  1.  Mild obstructive sleep apnea with excellent control noted by  treatment      with C-PAP at 8 cm of water pressure. Other alternatives for this degree      of sleep apnea may include weight loss alone if appropriate, oral      appliance, upper airway surgery.  2.  Large numbers of leg jerks with significant sleep disruption. However,      there was significant improvement      toward the end of the study with C-PAP pressure optimization. I would      recommend at this time treatment of the patient's obstructive sleep      apnea and, if remains symptomatic, would consider treatment of her leg      jerks.           ______________________________  Marcelyn Bruins, M.D. [facility]  Diplomate, American Board of Sleep  Medicine     KC/MEDQ  D:  08/06/2005 15:33:11  T:  08/06/2005 20:58:16  Job:  [id number]

## 2011-02-26 NOTE — Op Note (Signed)
. [facility]  Patient:    [name], [name] Visit Number: [id number] MRN: [id number]          Service Type: DSU Location: [phone] Attending Physician:  Brandy Hale Dictated by:   [name]. [name], M.D. Proc. Date: 03/14/02 Admit Date:  03/14/2002   CC:         Bruce H. Swords, M.D. [facility]  Luisa Hart E. Delford Field, M.D. [facility]  John N. Eda Keys., M.D. [facility]   Operative Report  PREOPERATIVE DIAGNOSIS: Recurrent incarcerated ventral hernia, complex.  POSTOPERATIVE DIAGNOSIS: Recurrent incarcerated ventral hernia, complex.  OPERATION PERFORMED: Repair, recurrent incarcerated ventral hernia with 12 x 12 inch polypropylene mesh.  SURGEON: [name]. [name], M.D.  PREOPERATIVE INDICATIONS: The patient is a 50 year old white female who has had multiple abdominal operations. She has had a cholecystectomy. She has had a laparoscopic Nissen fundoplication. She has had a laparoscopic ventral hernia repair with Gore-Tex mesh. She has had a laparoscopic tubal. She has morbid obesity, hypertension, chronic cough and vocal cord dysfunction. She presented last month with a ventral hernia. On examination she has multiple scars noted. There is a hernia in the midline above the umbilicus which is moderately large and reducible clinically. She states that this has been getting larger and is uncomfortable and wants to have this repaired.  OPERATIVE FINDINGS: The patient had a complex ventral hernia. The Gore-Tex mesh was noted in the subfascial location and a large incarcerated hernia had protruded above the upper edge of this. There was preperitoneal fat and transverse colon incarcerated in this, necessitating extensive dissection. She had multiple other defects in the midline, which required about 1 hour and 15 minutes of tedious dissection to untangle the multiple incarcerated areas of preperitoneal fat and defining areas of fascial herniation. The  incision ultimately went about from the xiphoid all the way down to the umbilicus and with extensive undermining in all directions to define and reduce all of the abnormal areas. The transverse colon itself looked normal.  OPERATIVE TECHNIQUE: Following the induction of general endotracheal anesthesia the patients abdomen was prepped and draped in a sterile fashion. We ultimately excised the midline scar from just below the xiphoid all the way down to the supraumbilical area. Dissection was carried down through the subcutaneous tissue all the way down to the midline fascia.  We identified a large hernia about 6 cm in diameter or greater with incarcerated transverse colon. This was dissected away from the fascia. We entered the hernia sac, identified the GI tract and reduced all of this and dissected all of the adhesions away until everything was completely reduced. We undermined the subcutaneous tissue circumferentially until we had about 4 cm to 5 cm normal fascia in all directions. We found multiple other defects in the midline and had to debride the incarcerated fat out of these which was somewhat difficult because of the scar tissue. Ultimately we had a very large wound. We closed the major defect in the fascia with about 10 interrupted sutures of #1 Novofil. This closed that defect back in the midline.  We took a 12 inch x 12 inch piece of polypropylene mesh and cut this into an elliptical shape. This was sutured in place on top of the fascia with about 16 interrupted mattress sutures of 0 Prolene. The mesh covered the midline all the way from the umbilicus to the xiphoid and was about 8 inches wide at its widest point, and was about 12 to 14  inches long in the sagittal orientation.  The wound was copiously irrigated with 2 liters of saline. Hemostasis was very good, achieved with electrocautery. The wound was then irrigated with antibiotic irrigating solution. Two 19 French Blake  drains were placed in the wound and brought out through separate stab incisions in the upper abdomen. These drains were sutured to the skin with nylon sutures and connected to suction bulbs.  I chose not to close the subcutaneous tissue because of concern that this would cause tissue necrosis and infection. I simply closed the skin with skin staples. A clean bandage was placed. The Alco abdominal band binder was placed. The patient was taken to the recovery room in stable condition. Estimated blood loss was about 250 cc. Complications were none. Sponge and instrument counts were correct.        Dictated by:   [name]. [name], M.D.  Attending Physician:  Brandy Hale DD:  03/14/02 TD:  03/15/02 Job: [id number]

## 2011-02-26 NOTE — Assessment & Plan Note (Signed)
[facility]                             PULMONARY OFFICE NOTE   NAME:Rodgers, PEMA THOMURE                     MRN:          [id number]  DATE:11/24/2006                            DOB:          12/05/1960    Ms. Thorson has returned today in followup and underwent  mediastinoscopy in January.  Biopsies were positive for noncaseating  granulomas, compatible with sarcoidosis.  Cultures are negative to date.  She is still having difficulty with breathing and chest pain is  continuing.   PHYSICAL EXAMINATION:  VITAL SIGNS:  Temp 98, blood pressure 110/68,  pulse 88, saturation 97% room air.  CHEST:  Showed clear except for a few scattered wheeze.  CARDIAC:  Showed a regular rate and rhythm without S3.  Normal S1 S2.  ABDOMEN:  Soft, nontender.  EXTREMITIES:  Showed no edema or clubbing.  SKIN:  Clear.   IMPRESSION:  1. Sarcoidosis with mediastinal adenopathy and likely some degree of      lower airway inflammation.  2. Reflux disease.  3. Vocal cord dysfunction syndrome.  4. Allergic rhinitis.  5. Obesity with obstructive sleep apnea.   PLAN:  1. Initiate prednisone therapy at 40 mg a day, tapering down by 10 mg      every four days until she gets to 10 mg a day and hold.  2. The patient will also received QVAR 80 mcg strength at 2 sprays      b.i.d.  3. We will return this patient for followup in 1 month.     Charlcie Cradle Delford Field, MD, [facility]  Electronically Signed    PEW/MedQ  DD: 11/24/2006  DT: 11/24/2006  Job #: [id number]   cc:   Valetta Mole. Cato Mulligan, MD  Ines Bloomer, M.D.

## 2011-02-26 NOTE — Discharge Summary (Signed)
[place]  Patient:    [name], [name] Visit Number: [id number] MRN: [id number]          Service Type: MED Location: [phone] Attending Physician:  Corwin Levins Dictated by:   Claretta Fraise, M.D. [facility] Admit Date:  11/24/2001 Discharge Date: 11/26/2001   CC:         Bruce H. Swords, M.D.  Julieanne Manson, M.D.  Charlcie Cradle Delford Field, M.D.   Discharge Summary  DISCHARGE DIAGNOSES: 1. Atypical chest pain most consistent with musculoskeletal chest pain.  The    patient was ruled out for a myocardial infarction by cardiac enzymes, also    ruled out for pulmonary embolism by CT of her chest and CT of her lower    extremities for deep venous thrombosis. 2. Urinary tract infection.  Cultures were ordered, but never obtained or    never done.  The patient will complete a three day course of Keflex since    it is an uncomplicated urinary tract infection. 3. Iron-deficiency anemia.  There is a possibility she might have some anemia    of chronic disease. 4. History of hypertension. 5. History of pulmonary nodule with followup chest CT to be done as an    outpatient, and this has apparently already been scheduled, and it is due    for April 2003, Dr. Delford Field for this. 6. History of anxiety. 7. History of gastroesophageal reflux disease, status post Nissen    fundoplication procedure.  DISCHARGE MEDICATIONS: 1. Celebrex 200 mg p.o. b.i.d. x4 days, and then she will be on one p.o. q.d.    x6 days. 2. Keflex 500 mg two p.o. b.i.d., the last dose being tomorrow. 3. Iron sulfate one p.o. t.i.d. 4. She will stay on her home medications of Protonix 40 mg q.d., Norvasc 10 mg    q.d., Paxil 40 mg q.d., Reglan 10 mg p.o. q.a.c. and q.h.s.  She also has    benzonatate and Tussionex p.r.n. that she takes at home.  FOLLOWUP: 1. Dr. Birdie Sons in his clinic at the [facility] primary care    office in 1 to 2 weeks.  She may need an outpatient  cardiac stress test.    Dr. Cato Mulligan can decide whether the stress test will be done through Dr.    Darrol Poke office or through our [place] office. 2. Dr. Delford Field as previously determined for her pulmonary nodule. 3. Dr. Clarene Duke, her cardiologist, as needed.  HOSPITAL COURSE:  The patient was admitted by Dr. Jonny Ruiz on 11/25/01, with complaints of chest pain which she described as pressure initially with some radiation into her left upper extremity and some tingling into her hands.  The patient at that time had associated symptoms of shortness of breath, nausea, and some sweating.  The patient noted that using the vacuum had made it worse. She did have some tenderness on the anterior chest wall, and baseline severe anxiety.  Please refer to the initial H&P for details of her presentation. Subsequently, the patient was admitted, watched on telemetry.  Serial cardiac enzymes were obtained, all of which have come back negative, first one being 37, then 32, and 30.  Troponins all in the 0.01 range.  The patient continued to have chest pain during her hospital stay here, and continued to be tender on examination of anterior chest wall.  She does note with movement it can bring on the chest pain.  She describes the chest pain now more  as sharp in nature, and with radiation to the left arm, and some tingling into her hands. She denies any other associated symptoms.  She was started on Vioxx yesterday, which she notes does help with the pain, nitroglycerin did not relieve her pain.  I have also given her some morphine sulfate.  The patient is actually stable for discharge to home.  Actually, all of her EKGs have come back with no ST-T wave changes, suggestive of an acute cardiac event.  In fact, it was normal sinus rhythm.  She also had CT scan of her chest to rule out a pulmonary embolism, and also CT of her lower extremities to rule out deep venous thrombosis.  These were read as being negative.  The  patient at the time of admission had complained of some frequency of urination.  An urinalysis was obtained, and that was suggestive of an urinary tract infection.  Dr. Rosanne Ashing had ordered urine cultures, however, these were never done.  The patient was subsequently started on Keflex 500 mg two p.o. b.i.d., and she is to complete a full 3 day course for an uncomplicated urinary tract infection.  The patient was also noted to be anemic at the time of admission with a hemoglobin of 11.5, hematocrit 33.3, MCV was normal at 79.9, MCHC was also normal at 34.5.  Iron studies were done, and her total serum iron was slightly decreased at 39.  Her serum ferratin actually was normal at 24.  TIBC was normal also.  Percent saturation was decreased at 11.  Most of the picture comes back suggestive of iron-deficiency anemia, although the serum ferratin of 24 would go against it.  Could act as an acute phase reactant.  I went ahead and started her on serum iron, have not obtained a TSH on her.  This can be done as an outpatient.  There was a question that was brought up by Dr. Jonny Ruiz as to whether she might have diabetes mellitus.  Her non-fasting sugar was actually technically normal at 111, but he obtained a hemoglobin A1C and that came back at 5.7, and she certainly is not diabetic at this point in time.  On the CT of her lower extremities, there is mention that her uterus was slightly enlarged and some contours, and the radiologist thinks it may be a possibility of her having uterine fibroids.  The patient says that her gynecologist has told her years ago that there might be a possibility of that. She denies any heavy bleeding.  I recommended that she follow up with her gynecologist.  LABORATORY DATA:  The patients initial laboratory workup showed her electrolytes to be fairly unremarkable with a sodium of 137, potassium 3.8,  chloride 103, CO2 26, glucose 111.  BUN and creatinine were 8 and  0.8. Calcium of 9.1.  Her liver function tests were really normal.  As mentioned earlier, her initial CPK was 30, troponin-I 0.01, MB less than 0.03.  Her white count was slightly elevated at 13.7, with a hemoglobin and hematocrit of 11.5 and 33.3, respectively.  MCV of 79.9, MCHC 34.5.  She had fairly normal differential.  Chest x-ray showed no acute processes, it was a portable x-ray, and they mentioned some mild borderline cardiomegaly.  The CT scan of her chest and lower extremities as previously mentioned in the hospital course.  In regards to her iron-deficiency anemia, I will leave it up to Dr. Cato Mulligan as to whether he would initiate any further workup, and as mentioned  earlier, if not done at [facility], then this can be done as an outpatient.  CONDITION ON DISCHARGE:  The patient is being discharged to home in stable condition.  FOLLOWUP:  As previously mentioned. Dictated by:   Claretta Fraise, M.D. [facility] Attending Physician:  Corwin Levins DD:  11/26/01 TD:  11/26/01 Job: [id number]

## 2011-03-01 ENCOUNTER — Encounter: Payer: BC Managed Care – PPO | Attending: Internal Medicine

## 2011-03-01 DIAGNOSIS — E119 Type 2 diabetes mellitus without complications: Secondary | ICD-10-CM | POA: Insufficient documentation

## 2011-03-01 DIAGNOSIS — Z713 Dietary counseling and surveillance: Secondary | ICD-10-CM | POA: Insufficient documentation

## 2011-03-01 NOTE — H&P (Signed)
NAMELAVONDA, [name]              ACCOUNT NO.:  [id number]  MEDICAL RECORD NO.:  [id number]           PATIENT TYPE:  I  LOCATION:  1427                         FACILITY[facility]  [facility]  PHYSICIAN:  Zannie Cove, MD     DATE OF BIRTH:  1961/04/08  DATE OF ADMISSION:  02/17/2011 DATE OF DISCHARGE:                             HISTORY & PHYSICAL   PRIMARY CARE PHYSICIAN:  Valetta Mole. Swords, MD  PULMONOLOGIST:  Charlcie Cradle. Delford Field, MD, [facility]  RHEUMATOLOGIST:  Lurena Nida, MD  CHIEF COMPLAINT:  Multiple symptoms.  HISTORY OF PRESENTING ILLNESS:  [name] is a 50 year old pleasant Caucasian female with history of pulmonary sarcoidosis, type 2 diabetes, hypertension, GERD, obesity, etc., was shopping with her friend today when she suddenly started experiencing abdominal pain associated with nausea, diaphoresis, and headache.  Due to these constellation of symptoms which were rather acute this afternoon, she decided to go get out of the shopping center and go and sit in her car.  She went outside and could not find her car, was a little confused, subsequently found by her friend and noted that her speech was a little slurred, these symptoms lasted for 3-5 minutes and subsequently resolved.  They called Dr. Cato Mulligan' office who asked her to come to the emergency room for further evaluation.  She was sent to [facility] where there was a concern for TIA and hence transferred to [facility] for further workup.  At this point, her confusion, slurred speech has completely resolved.  She did not notice any other neurological symptoms namely weakness or tingling, numbness, etc. in any of her arms or legs. She denied any history of trauma, any recent changes in medication except the fact that she was taken off Imuran due to the fact that it was not felt to be working and received her second dose of Humira on Friday through Dr. Ewell Poe office.  In addition, the  patient's abdominal pain has significantly improved with the pain medicine shot that she got in the ER; however, she does still endorse some generalized upper abdominal pain which was initially 10/10 and sharp and constant, however, currently down to 3/10.  She describes it as nonradiating and sharp in quality, no associated exacerbating factors.  The patient reports last bowel movement was yesterday.  She had an x-ray done in the ER, which showed plenty of stool in the colon, but otherwise no acute abnormalities.  Of note, she had a colonoscopy 1 year ago by Dr. Marina Goodell which was reportedly normal except for a fissure.  PAST MEDICAL HISTORY:  Significant for: 1. Pulmonary sarcoidosis. 2. Diabetes. 3. Dyslipidemia. 4. Obesity. 5. GERD, status post Nissen fundoplication. 6. History of obstructive sleep apnea. 7. History of cholecystectomy. 8. History of hernia repair. 9. History of hysterectomy. 10.History of cardiac cath in October 2011, which was normal.  MEDICATIONS: 1. Humira one injection subcu q.2 weeks. 2. Vitamin D 1000 units one capsule b.i.d. 3. Lipitor 40 mg daily. 4. Restasis ophthalmic one drop p.r.n. 5. Prednisone 10 mg daily. 6. Potassium chloride 20 mEq 1.5 tablets b.i.d. 7. Omnaris 50 mcg both nostrils  one spray daily. 8. Lotemax 0.5% both eyes one drop q.6 h. p.r.n. 9. Hydrocodone/APAP 5/325 one to two tablets q.4 h. p.r.n. 10.Lasix 40 mg daily. 11.Estrace 2 mg one tablet nightly. 12.Effexor 75 mg two tablets nightly. 13.Dexilant 60 mg daily. 14.Bupropion XL 300 mg daily. 15.Benzonatate 100 mg p.o. q.4 h. p.r.n. 16.Atenolol/hydrochlorothiazide 50/25 one tablet daily at bedtime.  ALLERGIES:  METHOTREXATE which causes mouth sores.  SOCIAL HISTORY:  Married, lives with her husband.  No history of alcohol or tobacco use.  Used to work at [employer].  FAMILY HISTORY:  Significant for coronary artery disease in her father and brother deceased  secondary lung CA.  REVIEW OF SYSTEMS:  Negative except per HPI.  PHYSICAL EXAMINATION:  VITAL SIGNS:  Temperature is 98.5, pulse is 94, blood pressure 141/92, respirations 18, satting 93% on room air. GENERAL:  She is an obese Caucasian female laying in the stretcher in no acute distress. HEENT:  Pupils are round and reactive to light.  Extraocular movements intact.  Oral mucosa is moist and pink. NECK:  No JVD, lymphadenopathy, or thyromegaly. CARDIOVASCULAR SYSTEM:  S1-S2 regular, rate and rhythm. LUNGS:  Clear to auscultation bilaterally. ABDOMEN:  Soft, obese, slightly distended.  There are scars from prior cholecystectomy as well as hernia repair.  There is minimal tenderness in the supraumbilical area.  Positive normal bowel sounds. EXTREMITIES:  No edema, clubbing, or cyanosis. NEUROLOGIC:  Cranial nerves II-XII grossly intact.  Strength 5/5 in all extremities.  Plantars are downgoing.  Reflexes 2+.  Review of laboratory data shows white count of 11.3, hemoglobin 12.8, platelets are 220.  Chemistries; sodium 138, potassium 2.9, chloride 94, bicarb 29, BUN 9, creatinine 0.4, glucose 161, calcium 7.8.  Images shows CT of the abdomen and pelvis on Feb 17, 2011 was normal.  KUB Feb 17, 2011, moderate amount of feces throughout the colon.  No bowel obstruction or free air.  No active lung disease.  ASSESSMENT AND PLAN:  [name] is a 50 year old female with 1. Suspected transient ischemic attack versus acute confusional state     of unclear etiology, possibly panic attack.  However, due to her     multiple cerebrovascular accident risk factors, we will admit her     to telemetry today, start her on full-strength aspirin.  Check a 2-     D echo, get carotid Dopplers, check her lipids as well as     hemoglobin A1c. 2. Abdominal pain.  At this time, it is nonspecific and could be     secondary to constipation.  We will keep her on a clear liquid diet     and try laxatives  and stool softener trial.  If there is no     improvement, we will consider checking a CT of the abdomen and     pelvis tomorrow due to history of being on immunosuppressants due     to sarcoidosis. 3. For diabetes, we will put her on sliding scale insulin. 4. Hypokalemia, seems like this is chronic since she takes 30 mEq     twice a day.  We will replace this and follow her BMET closely. 5. Further management as condition evolves.     Zannie Cove, MD     PJ/MEDQ  D:  02/17/2011  T:  02/17/2011  Job:  [id number]  cc:   Valetta Mole. Swords, MD [address] [place] [address]  Lurena Nida, MD Fax: [phone]  Electronically Signed by Zannie Cove  on 03/01/2011 08:09:43 PM

## 2011-03-04 NOTE — Discharge Summary (Signed)
[name], [name]              ACCOUNT NO.:  [id number]  MEDICAL RECORD NO.:  [id number]           PATIENT TYPE:  I  LOCATION:  1427                         [facility]ACILITY[facility]  [facility]  PHYSICIAN:  Sol Odor I Advay Volante, MD      DATE OF BIRTH:  11/01/1960  DATE OF ADMISSION:  02/17/2011 DATE OF DISCHARGE:  02/19/2011                              DISCHARGE SUMMARY   DISCHARGE DIAGNOSES: 1. Transient ischemic attack, vasovagal event, resolved. 2. Abdominal pain, felt to be secondary to constipation. 3. Hypokalemia. 4. Hyperlipidemia with elevated triglycerides. 5. Type 2 diabetes mellitus, uncontrolled with hemoglobin A1c of 8.1. 6. Truncal obesity. 7. Hypertension.  CONSULTATIONS:  None.  PROCEDURE: 1. CT abdomen and pelvis, no acute abnormalities. 2. MRI of the brain, negative. 3. Venous Doppler of bilateral lower extremities, negative for DVT. 4. Carotid duplex, no evidence of significant ICA stenosis. 5. A 2-D, EF 50% to 60%, no wall motion abnormalities.  HISTORY OF PRESENT ILLNESS:  This is a 50 year old female with history of diabetes mellitus, hypertension, obesity, sarcoidosis where the patient developed acute onset of headaches followed by nausea and abdominal pain and diaphoresis.  She felt confused for few minutes and friend said she had some slurred speech.  The patient sent for TIA workup.  EKG, sinus rhythm, rule out inferior infarct.  The patient denies any chest pain.  HOSPITAL COURSE: 1. Period of confusion and diaphoresis.  The patient admitted to     Telemetry.  MRI of the brain was negative for any acute event.  The     patient was placed on aspirin 325 mg p.o. daily for prophylactic.     Speech was clear.  There is no neurological deficit.  A 2-D echo     did not show any wall motion abnormalities.  The patient noticed to     have elevated hemoglobin A1c of 8.3 and triglycerides of 562.  The     patient is to resume Lipitor 40 mg p.o. daily.  On EKG, there is no   evidence of ST depression or elevation.  Abdominal pain felt to be     due to constipation and abdominal pain completely resolved as per     patient having bowel movement. 2. Type 2 diabetes mellitus.  The patient started on Tradjenta 5 mg     p.o. daily and primary care could start metformin 500 mg p.o.     b.i.d.  Metformin was not started secondary to the patient's recent     contrast.  The patient was educated about symptoms of hypoglycemia     and hyperglycemia. 3. Hypokalemia.  The patient continued to complain of hypokalemia.     Looking at the patient's medication, she is on long-acting     hydrochlorothiazide and chlorthalidone which will be stopped and     the patient will be discharged with atenolol 50 mg daily and     lisinopril 5 mg p.o. daily. 4. Hyperlipidemia.  The patient will continue her Lipitor and further     adjustment to be done as an outpatient. 5. Obesity.  Counseling was done.  DIET:  The patient will be discharged with diabetic-healthy heart diet.  ACTIVITY:  As tolerated.  FOLLOWUP:  The patient needs to follow up with her physician on Monday, Feb 22, 2011.  The patient was informed to check her potassium.  Further workup of her hypokalemia could be addressed after adjusting her medication.     Robert Sperl Bosie Helper, MD     HIE/MEDQ  D:  02/19/2011  T:  02/20/2011  Job:  [id number]  Electronically Signed by Ebony Cargo MD on 03/04/2011 09:45:57 AM

## 2011-03-09 NOTE — Assessment & Plan Note (Signed)
Followed by Dr [name] 

## 2011-03-09 NOTE — Assessment & Plan Note (Signed)
Adequate control. Continue current medications. 

## 2011-03-09 NOTE — Progress Notes (Signed)
  Subjective:    Patient ID: [name], female    DOB: [birth year], 50 y.o.   MRN: [id number]  HPI  Patient is here for followup after hospitalization. The following are her discharge diagnoses. DISCHARGE DIAGNOSES:  1. Transient ischemic attack, vasovagal event, resolved. -- I reviewed MRI no indication of stroke. 2. Abdominal pain, felt to be secondary to constipation. --- Has resolved. 3. Hypokalemia. ----Will need followup. 4. Hyperlipidemia with elevated triglycerides.--- Lipitor resumed in the hospital.  5. Type 2 diabetes mellitus, uncontrolled with hemoglobin A1c of 8.1. --- She has not been concentrating on weight loss or a low fat, low calorie, low carbohydrate diet. 6. Truncal obesity. --- She has had some success with weight loss in the past, none recently. 7. Hypertension. --- She is tolerating medications without difficulty.  Past Medical History  Diagnosis Date  . Sarcoidosis 04/10/2007  . DIABETES MELLITUS, TYPE II 08/21/2006  . HYPERLIPIDEMIA 08/21/2006  . GOUT 08/20/2010  . OBESITY 06/04/2009  . ANEMIA-UNSPECIFIED 09/18/2009  . HYPERTENSION 08/21/2006  . GERD 08/21/2006  . SLEEP APNEA 04/21/2009   Past Surgical History  Procedure Date  . Ventral hernia repair   . Nissen fundoplication   . Cholecystectomy   . Abdominal hysterectomy   . Knee arthroscopy     right    reports that she has never smoked. She has never used smokeless tobacco. She reports that she does not drink alcohol or use illicit drugs. family history includes Asthma in her mother; COPD in her mother; Diabetes in her father; Emphysema in her mother; Heart attack in her father; and Sarcoidosis in her maternal uncle. Allergies  Allergen Reactions  . Methotrexate     REACTION: peri-oral and buccal lesions.      Review of Systems  patient denies chest pain, shortness of breath, orthopnea. Denies lower extremity edema, abdominal pain, change in appetite, change in bowel movements. Patient  denies rashes, musculoskeletal complaints. No other specific complaints in a complete review of systems.      Objective:   Physical Exam  Well-developed well-nourished female in no acute distress. HEENT exam atraumatic, normocephalic, extraocular muscles are intact. Neck is supple. No jugular venous distention no thyromegaly. Chest clear to auscultation without increased work of breathing. Cardiac exam S1 and S2 are regular. Abdominal exam active bowel sounds, soft, nontender, obese . Extremities no edema. Neurologic exam she is alert without any motor sensory deficits. Gait is normal.        Assessment & Plan:

## 2011-03-09 NOTE — Assessment & Plan Note (Signed)
I again discussed need for aggressive weight loss. She states that she is motivated to start her weight loss program again.

## 2011-03-10 ENCOUNTER — Ambulatory Visit (INDEPENDENT_AMBULATORY_CARE_PROVIDER_SITE_OTHER): Payer: BC Managed Care – PPO | Admitting: Internal Medicine

## 2011-03-10 ENCOUNTER — Encounter: Payer: Self-pay | Admitting: Internal Medicine

## 2011-03-10 VITALS — BP 126/74 | Temp 98.7°F | Wt 239.0 lb

## 2011-03-10 DIAGNOSIS — R5381 Other malaise: Secondary | ICD-10-CM

## 2011-03-10 DIAGNOSIS — R5383 Other fatigue: Secondary | ICD-10-CM

## 2011-03-10 NOTE — Progress Notes (Signed)
Subjective:    Patient ID: [name], female    DOB: 12-02-1960, 50 y.o.   MRN: [id number]  HPI Patient comes in with her mother for followup. She is recently been hospitalized. The hospitalization reviewed. His main complaint is fatigue. She states that she just doesn't have the energy to get up and go. She has multiple medical problems and is taking numerous medications. Patient has diabetes, hypertension, hyperlipidemia, sarcoidosis. She is being actively treated for all of these. She is on multiple medications.  Past Medical History  Diagnosis Date  . Sarcoidosis 04/10/2007  . DIABETES MELLITUS, TYPE II 08/21/2006  . HYPERLIPIDEMIA 08/21/2006  . GOUT 08/20/2010  . OBESITY 06/04/2009  . ANEMIA-UNSPECIFIED 09/18/2009  . HYPERTENSION 08/21/2006  . GERD 08/21/2006  . SLEEP APNEA 04/21/2009   Past Surgical History  Procedure Date  . Ventral hernia repair   . Nissen fundoplication   . Cholecystectomy   . Abdominal hysterectomy   . Knee arthroscopy     right    reports that she has never smoked. She has never used smokeless tobacco. She reports that she does not drink alcohol or use illicit drugs. family history includes Asthma in her mother; COPD in her mother; Diabetes in her father; Emphysema in her mother; Heart attack in her father; and Sarcoidosis in her maternal uncle. Allergies  Allergen Reactions  . Methotrexate     REACTION: peri-oral and buccal lesions.      Review of Systems  patient denies chest pain, shortness of breath, orthopnea. Denies lower extremity edema, abdominal pain, change in appetite, change in bowel movements. Patient denies rashes, musculoskeletal complaints. No other specific complaints in a complete review of systems.      Objective:   Physical Exam  Well-developed well-nourished female in no acute distress. HEENT exam atraumatic, normocephalic, extraocular muscles are intact. Neck is supple. No jugular venous distention no thyromegaly. Chest  clear to auscultation without increased work of breathing. Cardiac exam S1 and S2 are regular. Abdominal exam active bowel sounds, soft, nontender, obese. Extremities no edema. Neurologic exam she is alert without any motor sensory deficits. Gait is normal.        Assessment & Plan:  Fatigue---reviewed meds---note many discontinued She will likely need ot resume some but will stop several today and f/u in 2 weeks  It is unclear to me what is causing her fatigue. I doubt that is specifically related to sarcoid, diabetes or any other of her numerous medical problems. I think it's much more likely that her fatigue is related to overmedication. I reviewed all of her medications in detail with the patient and her mother. I will follow up with the patient in 2 weeks. Numerous other medications were discontinued during this office visit.  Current outpatient prescriptions:Adalimumab (HUMIRA PEN [place]), Injection every 2 weeks. , Disp: , Rfl: ;  aspirin 81 MG tablet, Take 81 mg by mouth daily.  , Disp: , Rfl: ;  atenolol (TENORMIN) 50 MG tablet, Take 50 mg by mouth daily.  , Disp: , Rfl: ;  benzonatate (TESSALON) 100 MG capsule, Take 100 mg by mouth 3 (three) times daily as needed.  , Disp: , Rfl:  Cholecalciferol (GNP VITAMIN D) 1000 UNITS tablet, Take 1,000 Units by mouth daily.  , Disp: , Rfl: ;  ciclesonide (OMNARIS) 50 MCG/ACT nasal spray, 2 sprays by Each Nare route daily.  , Disp: , Rfl: ;  cycloSPORINE (RESTASIS) 0.05 % ophthalmic emulsion, Place 1 drop into both eyes daily.  ,  Disp: , Rfl: ;  Dexlansoprazole 60 MG capsule, Take 60 mg by mouth daily.  , Disp: , Rfl:  estradiol (ESTRACE) 1 MG tablet, Take 2 mg by mouth daily. , Disp: , Rfl: ;  furosemide (LASIX) 20 MG tablet, Take 20 mg by mouth 2 (two) times daily.  , Disp: , Rfl: ;  HYDROcodone-acetaminophen (NORCO) 5-325 MG per tablet, Take 2 tablets by mouth every 6 (six) hours as needed for pain., Disp: 20 tablet, Rfl: 0;  lisinopril (PRINIVIL,ZESTRIL)  5 MG tablet, Take 5 mg by mouth daily.  , Disp: , Rfl:  LORazepam (ATIVAN) 1 MG tablet, Take 1 mg by mouth every 6 (six) hours as needed.  , Disp: , Rfl: ;  loteprednol (LOTEMAX) 0.5 % ophthalmic suspension, Place 1 drop into both eyes every 6 (six) hours.  , Disp: , Rfl: ;  metFORMIN (GLUCOPHAGE) 500 MG tablet, Take 500 mg by mouth 2 (two) times daily with a meal.  , Disp: , Rfl: ;  nystatin-triamcinolone (MYCOLOG II) cream, Apply 100,000 g topically 2 (two) times daily as needed.  , Disp: , Rfl:  potassium chloride SA (K-DUR,KLOR-CON) 20 MEQ tablet, TAKE ONE TABLET BY MOUTH THREE TIMES DAILY, Disp: 90 tablet, Rfl: 2;  predniSONE (DELTASONE) 5 MG tablet, Take 10 mg by mouth daily. , Disp: , Rfl:   Medications Discontinued During This Encounter  Medication Reason  . simvastatin (ZOCOR) 40 MG tablet completed  . venlafaxine (EFFEXOR) 75 MG tablet completed  . venlafaxine (EFFEXOR-XR) 75 MG 24 hr capsule Therapy completed  . traMADol (ULTRAM) 50 MG tablet Therapy completed  . hyoscyamine (LEVBID) 0.375 MG 12 hr tablet Therapy completed  . Hydrocod Polst-Chlorphen Polst (TUSSICAPS) 10-8 MG CP12 Therapy completed  . cloNIDine (CATAPRES) 0.1 MG tablet Therapy completed  . buPROPion (WELLBUTRIN XL) 300 MG 24 hr tablet Therapy completed  . atorvastatin (LIPITOR) 40 MG tablet Therapy completed

## 2011-03-16 ENCOUNTER — Other Ambulatory Visit: Payer: Self-pay | Admitting: *Deleted

## 2011-03-16 ENCOUNTER — Other Ambulatory Visit: Payer: Self-pay | Admitting: Internal Medicine

## 2011-03-16 MED ORDER — METFORMIN HCL 500 MG PO TABS: 500.0000 mg | ORAL_TABLET | Freq: Two times a day (BID) | ORAL | Status: DC

## 2011-03-16 NOTE — Telephone Encounter (Signed)
Pt needs script for Tussinex cough syrup called in to [facility] on [address].

## 2011-03-17 ENCOUNTER — Emergency Department (HOSPITAL_COMMUNITY)
Admission: EM | Admit: 2011-03-17 | Discharge: 2011-03-17 | Disposition: A | Discharge status: Home / Self Care | Payer: BC Managed Care – PPO | Attending: Emergency Medicine | Admitting: Emergency Medicine

## 2011-03-17 DIAGNOSIS — I10 Essential (primary) hypertension: Secondary | ICD-10-CM | POA: Insufficient documentation

## 2011-03-17 DIAGNOSIS — E785 Hyperlipidemia, unspecified: Secondary | ICD-10-CM | POA: Insufficient documentation

## 2011-03-17 DIAGNOSIS — E119 Type 2 diabetes mellitus without complications: Secondary | ICD-10-CM | POA: Insufficient documentation

## 2011-03-17 DIAGNOSIS — R300 Dysuria: Secondary | ICD-10-CM | POA: Insufficient documentation

## 2011-03-17 DIAGNOSIS — N949 Unspecified condition associated with female genital organs and menstrual cycle: Secondary | ICD-10-CM | POA: Insufficient documentation

## 2011-03-17 DIAGNOSIS — B3731 Acute candidiasis of vulva and vagina: Secondary | ICD-10-CM | POA: Insufficient documentation

## 2011-03-17 DIAGNOSIS — K219 Gastro-esophageal reflux disease without esophagitis: Secondary | ICD-10-CM | POA: Insufficient documentation

## 2011-03-17 DIAGNOSIS — B373 Candidiasis of vulva and vagina: Secondary | ICD-10-CM | POA: Insufficient documentation

## 2011-03-17 LAB — URINALYSIS, ROUTINE W REFLEX MICROSCOPIC
Bilirubin Urine: NEGATIVE
Glucose, UA: NEGATIVE mg/dL
Hgb urine dipstick: NEGATIVE
Ketones, ur: NEGATIVE mg/dL
Nitrite: NEGATIVE
Protein, ur: NEGATIVE mg/dL
Specific Gravity, Urine: 1.024 (ref 1.005–1.030)
Urobilinogen, UA: 0.2 mg/dL (ref 0.0–1.0)
pH: 5.5 (ref 5.0–8.0)

## 2011-03-17 LAB — WET PREP, GENITAL
Trich, Wet Prep: NONE SEEN
WBC, Wet Prep HPF POC: NONE SEEN
Yeast Wet Prep HPF POC: NONE SEEN

## 2011-03-17 NOTE — Telephone Encounter (Signed)
rx called in, pt aware 

## 2011-03-17 NOTE — Telephone Encounter (Signed)
Ok 1 tsp po bid for 7 days 120 cc/ 1 refill

## 2011-03-24 ENCOUNTER — Encounter: Payer: Self-pay | Admitting: Internal Medicine

## 2011-03-24 ENCOUNTER — Ambulatory Visit (INDEPENDENT_AMBULATORY_CARE_PROVIDER_SITE_OTHER): Payer: BC Managed Care – PPO | Admitting: Internal Medicine

## 2011-03-24 VITALS — BP 134/76 | Temp 98.3°F | Wt 244.0 lb

## 2011-03-24 DIAGNOSIS — D860 Sarcoidosis of lung: Secondary | ICD-10-CM

## 2011-03-24 DIAGNOSIS — J99 Respiratory disorders in diseases classified elsewhere: Secondary | ICD-10-CM

## 2011-03-24 DIAGNOSIS — D869 Sarcoidosis, unspecified: Secondary | ICD-10-CM

## 2011-03-24 MED ORDER — PREDNISONE 10 MG PO TABS: ORAL_TABLET | ORAL | Status: DC

## 2011-03-24 NOTE — Progress Notes (Signed)
  Subjective:    Patient ID: [name], female    DOB: [birth year], 50 y.o.   MRN: [id number]  HPI  Multiple medical problems Last OV i discontinued several medications. She has resumed wellbutrin and effexor (mood is better on meds).   She has developed a cough (recurring issue). Nonproductive usually but can be thick mucous at times. She has had some wheezing. Has tried OTC meds and tussionex without results. No fever or chills. She does have sarcoid (thought to be inactive).  Past Medical History  Diagnosis Date  . Sarcoidosis 04/10/2007  . DIABETES MELLITUS, TYPE II 08/21/2006  . HYPERLIPIDEMIA 08/21/2006  . GOUT 08/20/2010  . OBESITY 06/04/2009  . ANEMIA-UNSPECIFIED 09/18/2009  . HYPERTENSION 08/21/2006  . GERD 08/21/2006  . SLEEP APNEA 04/21/2009   Past Surgical History  Procedure Date  . Ventral hernia repair   . Nissen fundoplication   . Cholecystectomy   . Abdominal hysterectomy   . Knee arthroscopy     right    reports that she has never smoked. She has never used smokeless tobacco. She reports that she does not drink alcohol or use illicit drugs. family history includes Asthma in her mother; COPD in her mother; Diabetes in her father; Emphysema in her mother; Heart attack in her father; and Sarcoidosis in her maternal uncle. Allergies  Allergen Reactions  . Methotrexate     REACTION: peri-oral and buccal lesions.     Review of Systems  patient denies chest pain, shortness of breath, orthopnea. Denies lower extremity edema, abdominal pain, change in appetite, change in bowel movements. Patient denies rashes, musculoskeletal complaints. No other specific complaints in a complete review of systems.      Objective:   Physical Exam  Well-developed well-nourished female in no acute distress. HEENT exam atraumatic, normocephalic, extraocular muscles are intact. Neck is supple. No jugular venous distention no thyromegaly. Chest clear to auscultation without  increased work of breathing. Cardiac exam S1 and S2 are regular. Abdominal exam active bowel sounds, soft, nontender. Extremities no edema. Neurologic exam she is alert without any motor sensory deficits. Gait is normal.        Assessment & Plan:

## 2011-03-24 NOTE — Patient Instructions (Signed)
Call in one week if cough not significantly better

## 2011-03-24 NOTE — Assessment & Plan Note (Signed)
She has a recurring cough. i doubt related to cough Short course prednisone She understands relation between prednisone and DM---she will call with any questions.

## 2011-03-25 ENCOUNTER — Telehealth: Payer: Self-pay | Admitting: *Deleted

## 2011-03-25 DIAGNOSIS — I10 Essential (primary) hypertension: Secondary | ICD-10-CM

## 2011-03-25 MED ORDER — GLUCOSE BLOOD VI STRP: ORAL_STRIP | Status: DC

## 2011-03-25 MED ORDER — LISINOPRIL 5 MG PO TABS: 5.0000 mg | ORAL_TABLET | Freq: Every day | ORAL | Status: DC

## 2011-03-25 MED ORDER — ATENOLOL 50 MG PO TABS: 50.0000 mg | ORAL_TABLET | Freq: Every day | ORAL | Status: DC

## 2011-03-25 NOTE — Telephone Encounter (Signed)
Pt needs refill on Lisinopril 5 mg. And Atenolol 50 mg to [facility] [name])  Also, Freestyle Freedom Lite test strips

## 2011-03-25 NOTE — Telephone Encounter (Signed)
rx sent into pharmacy

## 2011-03-30 ENCOUNTER — Other Ambulatory Visit: Payer: Self-pay | Admitting: Critical Care Medicine

## 2011-03-30 ENCOUNTER — Ambulatory Visit: Payer: BC Managed Care – PPO | Admitting: Internal Medicine

## 2011-03-31 ENCOUNTER — Encounter: Payer: Self-pay | Admitting: Internal Medicine

## 2011-03-31 ENCOUNTER — Ambulatory Visit (INDEPENDENT_AMBULATORY_CARE_PROVIDER_SITE_OTHER): Payer: BC Managed Care – PPO | Admitting: Internal Medicine

## 2011-03-31 DIAGNOSIS — E119 Type 2 diabetes mellitus without complications: Secondary | ICD-10-CM

## 2011-03-31 DIAGNOSIS — D869 Sarcoidosis, unspecified: Secondary | ICD-10-CM

## 2011-03-31 DIAGNOSIS — I10 Essential (primary) hypertension: Secondary | ICD-10-CM

## 2011-03-31 DIAGNOSIS — J99 Respiratory disorders in diseases classified elsewhere: Secondary | ICD-10-CM

## 2011-03-31 DIAGNOSIS — D860 Sarcoidosis of lung: Secondary | ICD-10-CM

## 2011-03-31 MED ORDER — HYDROCOD POLST-CHLORPHEN POLST 10-8 MG/5ML PO LQCR: 5.0000 mL | Freq: Two times a day (BID) | ORAL | Status: DC

## 2011-03-31 NOTE — Progress Notes (Signed)
  Subjective:    Patient ID: [name], female    DOB: Mar 13, 1961, 50 y.o.   MRN: [id number]  HPI  50 year old patient who has a history of sarcoid lung disease. She was seen here one week ago with a chief complaint of cough and was treated with a modest prednisone taper. She has not improved. Cough is nonproductive she denies any fever or significant sputum production. Denies any postnasal drip; she is on a nasal steroid. She has treated hypertension which includes low-dose ACE inhibition. She is leaving for the beach for one week tomorrow and wishes to have relief from her refractory cough. She has been treated with Tussionex in the past with improvement. She has maintained good glycemic control and site of her recent prednisone Dosepak  Review of Systems  Constitutional: Negative.   HENT: Negative for hearing loss, congestion, sore throat, rhinorrhea, dental problem, sinus pressure and tinnitus.   Eyes: Negative for pain, discharge and visual disturbance.  Respiratory: Positive for cough. Negative for shortness of breath.   Cardiovascular: Negative for chest pain, palpitations and leg swelling.  Gastrointestinal: Negative for nausea, vomiting, abdominal pain, diarrhea, constipation, blood in stool and abdominal distention.  Genitourinary: Negative for dysuria, urgency, frequency, hematuria, flank pain, vaginal bleeding, vaginal discharge, difficulty urinating, vaginal pain and pelvic pain.  Musculoskeletal: Negative for joint swelling, arthralgias and gait problem.  Skin: Negative for rash.  Neurological: Negative for dizziness, syncope, speech difficulty, weakness, numbness and headaches.  Hematological: Negative for adenopathy.  Psychiatric/Behavioral: Negative for behavioral problems, dysphoric mood and agitation. The patient is not nervous/anxious.        Objective:   Physical Exam  Constitutional: She is oriented to person, place, and time. She appears well-developed and  well-nourished. No distress.       Obese Blood pressure 118/78  HENT:  Head: Normocephalic.  Right Ear: External ear normal.  Left Ear: External ear normal.  Mouth/Throat: Oropharynx is clear and moist.  Eyes: Conjunctivae and EOM are normal. Pupils are equal, round, and reactive to light.  Neck: Normal range of motion. Neck supple. No thyromegaly present.  Cardiovascular: Normal rate, regular rhythm, normal heart sounds and intact distal pulses.   Pulmonary/Chest: Effort normal. She has wheezes.       Patient had a prolonged expiratory phase with some faint wheezing  Abdominal: Soft. Bowel sounds are normal. She exhibits no mass. There is no tenderness.  Musculoskeletal: Normal range of motion.  Lymphadenopathy:    She has no cervical adenopathy.  Neurological: She is alert and oriented to person, place, and time.  Skin: Skin is warm and dry. No rash noted.  Psychiatric: She has a normal mood and affect. Her behavior is normal.          Assessment & Plan:    sarcoid lung disease.   We'll increase her prednisone to 5 mg twice a day until she  returns from the beach. We'll treat with Tussionex twice a day;  continue nasal steroids ; will add  Qvar but unclear  benefit with her systemic steroid treatment. Blood pressure is  well controlled;  we'll give her a trial off lisinopril which may be a complicating factor. Blood pressure will be reassessed next month at the time of her pulmonary consultation

## 2011-03-31 NOTE — Patient Instructions (Signed)
Increase prednisone to 5 mg twice daily for one week Qvar 2 puffs daily  Hold lisinopril Tussionex 1 teaspoon twice daily  Pulmonary followup next month as scheduled  Call or return to clinic prn if these symptoms worsen or fail to improve as anticipated.

## 2011-04-02 NOTE — Telephone Encounter (Signed)
Pt was started on this by PW on 06/17/10.  At OV with Dr. Cato Mulligan on 08/05/10, she was told to stop this and try zegerid 40/1100.  On 10/19/10 at OV with PW, pt reported she was taking dexilant instead of zegerid and this was changed in med list.  Dr. Delford Field, pls advise - would you like to refill the dexilant?

## 2011-04-20 ENCOUNTER — Ambulatory Visit: Payer: BC Managed Care – PPO

## 2011-04-24 ENCOUNTER — Other Ambulatory Visit: Payer: Self-pay | Admitting: Internal Medicine

## 2011-04-27 ENCOUNTER — Other Ambulatory Visit: Payer: Self-pay | Admitting: *Deleted

## 2011-04-27 ENCOUNTER — Ambulatory Visit: Payer: BC Managed Care – PPO

## 2011-04-27 MED ORDER — LORAZEPAM 1 MG PO TABS: 1.0000 mg | ORAL_TABLET | Freq: Two times a day (BID) | ORAL | Status: DC | PRN

## 2011-04-27 NOTE — Telephone Encounter (Signed)
OK 

## 2011-04-27 NOTE — Telephone Encounter (Signed)
rx faxed in 

## 2011-04-27 NOTE — Telephone Encounter (Signed)
Refill lorazepam 1 mg 1 po bid prn

## 2011-04-29 ENCOUNTER — Encounter: Payer: Self-pay | Admitting: Internal Medicine

## 2011-04-29 ENCOUNTER — Ambulatory Visit (INDEPENDENT_AMBULATORY_CARE_PROVIDER_SITE_OTHER): Payer: BC Managed Care – PPO | Admitting: Internal Medicine

## 2011-04-29 VITALS — BP 120/80 | Temp 98.3°F | Wt 250.0 lb

## 2011-04-29 DIAGNOSIS — D869 Sarcoidosis, unspecified: Secondary | ICD-10-CM

## 2011-04-29 DIAGNOSIS — E119 Type 2 diabetes mellitus without complications: Secondary | ICD-10-CM

## 2011-04-29 DIAGNOSIS — I10 Essential (primary) hypertension: Secondary | ICD-10-CM

## 2011-04-29 DIAGNOSIS — D860 Sarcoidosis of lung: Secondary | ICD-10-CM

## 2011-04-29 DIAGNOSIS — J99 Respiratory disorders in diseases classified elsewhere: Secondary | ICD-10-CM

## 2011-04-29 DIAGNOSIS — M549 Dorsalgia, unspecified: Secondary | ICD-10-CM

## 2011-04-29 DIAGNOSIS — E669 Obesity, unspecified: Secondary | ICD-10-CM

## 2011-04-29 LAB — POCT URINALYSIS DIPSTICK
Bilirubin, UA: NEGATIVE
Blood, UA: NEGATIVE
Glucose, UA: NEGATIVE
Ketones, UA: NEGATIVE
Nitrite, UA: NEGATIVE
Protein, UA: NEGATIVE
Spec Grav, UA: 1.01
Urobilinogen, UA: 0.2
pH, UA: 5

## 2011-04-29 MED ORDER — METOCLOPRAMIDE HCL 10 MG PO TABS: ORAL_TABLET | ORAL | Status: DC

## 2011-04-29 NOTE — Patient Instructions (Signed)
Limit your sodium (Salt) intake  Metoclopramide as directedDiabetes and Gastroparesis   Gastroparesis is also called slowed stomach emptying (delayed gastric emptying). It is a condition in which the stomach takes too long to empty its contents. It often happens in people with diabetes.    CAUSES Gastroparesis happens when nerves to the stomach are damaged or stop working. When the nerves are damaged, the muscles of the stomach and intestines do not work normally. Then, the movement of food is slowed or stopped. High blood glucose (sugar) causes changes in nerves and can damage the blood vessels that carry oxygen and nutrients to the nerves. RISK FACTORS  Diabetes.   Post-viral syndromes.   Eating disorders (anorexia or bulimia).   Surgery on the stomach or vagus nerve.   Gastroesophageal reflux disease (rarely).   Smooth muscle disorders such as amyloidosis and scleroderma.   Metabolic disorders, including hypothyroidism.   Parkinson's disease.  SYMPTOMS  Heartburn.   Feeling sick to your stomach (nausea).   Vomiting of undigested food.   An early feeling of fullness when eating.   Weight loss.   Belly (abdominal) bloating.   Erratic blood glucose levels.   Lack of appetite.   Gastroesophageal reflux.   Spasms of the stomach wall.  COMPLICATIONS  Bacterial overgrowth in stomach. Food stays in the stomach and can ferment and cause germs (bacteria) to grow.   Weight loss. You have difficulty digesting and absorbing nutrients.   Vomiting.   Obstruction in the stomach. Undigested food can harden and cause nausea and vomiting.   Blood glucose fluctuations caused by inconsistent food absorption.  DIAGNOSIS The diagnosis of gastroparesis is confirmed through one or more of the following tests:  Barium X-rays and scans. These tests look at how long it takes for food to move through the stomach.   Gastric manometry. This test measures electrical and muscular  activity in the stomach. A thin tube is passed down the throat into the stomach. The tube contains a wire that takes measurements of the stomach's electrical and muscular activity as it digests liquids and solid food.   An endoscopy is a procedure done with a long, thin tube called an endoscope. It is passed through the mouth and gently guides it down the esophagus into the stomach. This tubes helps the caregiver look at the lining of the stomach to check for any abnormalities.   Ultrasound. This can rule out gallbladder disease or pancreatitis. This test will outline and define the shape of the gallbladder and pancreas.  TREATMENT  The primary treatment is to identify the problem (i.e. diabetes) and to help control blood glucose levels. Treatments include:   Exercise.   Medications to control nausea and vomiting.   Medications to stimulate stomach muscles.   Changes in what and when you eat.   Having smaller meals more often.   Eating low fiber forms of high fiber foods (i.e. cooked vegetables instead of raw).   Eating low fat foods.   Liquids are easier to digest.   In severe cases, feeding tubes and intravenous feeding may be needed.   It is important to note that in most cases, treatment does not cure gastroparesis. It is usually a lasting (chronic) condition. Treatment helps you manage the condition so that you can be as healthy and comfortable as possible.  NEW TREATMENTS  A gastric neurostimulator has been developed to assist people with gastroparesis. The battery-operated device is surgically implanted. It emits mild electrical pulses to help improve  stomach emptying and to control nausea and vomiting.   The use of botulinum toxin has been shown to improve stomach emptying by decreasing the prolonged contractions of the muscle between the stomach and the small intestine (pyloric sphincter). The benefits are temporary.  SEEK MEDICAL CARE IF:  You are having problems keeping  your blood glucose in goal range.   You are having nausea, vomiting, bloating or feelings of fullness with eating.   Your symptoms do not change with a change in diet.  Document Released: 09/27/2005 Document Re-Released: 07/25/2009 [facility] Patient Information 2011 [place], [place].

## 2011-04-29 NOTE — Progress Notes (Signed)
  Subjective:    Patient ID: [name], female    DOB: 12-13-1960, 50 y.o.   MRN: [id number]  HPI 50 year old patient who is seen today with a chief complaint of midabdominal pain. This has been a problem for approximately 2 weeks. She has a complex past medical history including sarcoid lung disease for which she is on chronic prednisone. She has a history of type 2 diabetes of 11 years duration. She complains of crampy abdominal pain which is associated with meals and postprandial. She has nausea but no vomiting. She describes no change in her weight or bowel habits. Associated symptoms include bloating   Review of Systems  Constitutional: Negative.   HENT: Negative for hearing loss, congestion, sore throat, rhinorrhea, dental problem, sinus pressure and tinnitus.   Eyes: Negative for pain, discharge and visual disturbance.  Respiratory: Negative for cough and shortness of breath.   Cardiovascular: Negative for chest pain, palpitations and leg swelling.  Gastrointestinal: Positive for nausea, abdominal pain and abdominal distention. Negative for vomiting, diarrhea, constipation and blood in stool.  Genitourinary: Negative for dysuria, urgency, frequency, hematuria, flank pain, vaginal bleeding, vaginal discharge, difficulty urinating, vaginal pain and pelvic pain.  Musculoskeletal: Negative for joint swelling, arthralgias and gait problem.  Skin: Negative for rash.  Neurological: Negative for dizziness, syncope, speech difficulty, weakness, numbness and headaches.  Hematological: Negative for adenopathy.  Psychiatric/Behavioral: Negative for behavioral problems, dysphoric mood and agitation. The patient is not nervous/anxious.        Objective:   Physical Exam  Constitutional: She is oriented to person, place, and time. She appears well-developed and well-nourished. No distress.  HENT:  Head: Normocephalic.  Right Ear: External ear normal.  Left Ear: External ear normal.    Mouth/Throat: Oropharynx is clear and moist.  Eyes: Conjunctivae and EOM are normal. Pupils are equal, round, and reactive to light.  Neck: Normal range of motion. Neck supple. No thyromegaly present.  Cardiovascular: Normal rate, regular rhythm, normal heart sounds and intact distal pulses.   Pulmonary/Chest: Effort normal and breath sounds normal.  Abdominal: Soft. Bowel sounds are normal. She exhibits no distension and no mass. There is no tenderness. There is no rebound and no guarding.       Bowel sounds active  Musculoskeletal: Normal range of motion.  Lymphadenopathy:    She has no cervical adenopathy.  Neurological: She is alert and oriented to person, place, and time.  Skin: Skin is warm and dry. No rash noted.  Psychiatric: She has a normal mood and affect. Her behavior is normal.          Assessment & Plan:   Postprandial abdominal pain and nausea. Possible diabetic gastroparesis. We'll give a 10 day trial of Reglan. If improved will refer to Dr. Marina Goodell her gastroenterologist Diabetes mellitus hypertension Obesity

## 2011-05-12 ENCOUNTER — Other Ambulatory Visit: Payer: Self-pay | Admitting: Internal Medicine

## 2011-05-17 ENCOUNTER — Telehealth: Payer: Self-pay | Admitting: Internal Medicine

## 2011-05-17 NOTE — Telephone Encounter (Signed)
Left message for pt to call back.  Pt c/o abdominal pain, states this has been going on for about 3 weeks. Pt states she is having about 4 stools a day. Pt states it isn't diarrhea, it is in between. Pt requests to be seen. Pt scheduled to see Willette Cluster NP 05/19/11@11am . Pt aware of appt date and time. Pt was last seen by Dr. Marina Goodell in October of 2011 for noncardiac chest pain. Pt had an endo 08/2010.

## 2011-05-19 ENCOUNTER — Other Ambulatory Visit (INDEPENDENT_AMBULATORY_CARE_PROVIDER_SITE_OTHER): Payer: BC Managed Care – PPO

## 2011-05-19 ENCOUNTER — Ambulatory Visit: Payer: Medicare Other | Admitting: Internal Medicine

## 2011-05-19 ENCOUNTER — Ambulatory Visit (INDEPENDENT_AMBULATORY_CARE_PROVIDER_SITE_OTHER): Payer: BC Managed Care – PPO | Admitting: Nurse Practitioner

## 2011-05-19 ENCOUNTER — Encounter: Payer: Self-pay | Admitting: Nurse Practitioner

## 2011-05-19 VITALS — BP 110/72 | HR 72 | Ht 67.0 in | Wt 249.0 lb

## 2011-05-19 DIAGNOSIS — R194 Change in bowel habit: Secondary | ICD-10-CM

## 2011-05-19 DIAGNOSIS — R1084 Generalized abdominal pain: Secondary | ICD-10-CM | POA: Insufficient documentation

## 2011-05-19 DIAGNOSIS — R198 Other specified symptoms and signs involving the digestive system and abdomen: Secondary | ICD-10-CM

## 2011-05-19 LAB — CBC WITH DIFFERENTIAL/PLATELET
Basophils Absolute: 0 10*3/uL (ref 0.0–0.1)
Basophils Relative: 0.3 % (ref 0.0–3.0)
Eosinophils Absolute: 0.1 10*3/uL (ref 0.0–0.7)
Eosinophils Relative: 1.1 % (ref 0.0–5.0)
HCT: 37.3 % (ref 36.0–46.0)
Hemoglobin: 12.5 g/dL (ref 12.0–15.0)
Lymphocytes Relative: 22.7 % (ref 12.0–46.0)
Lymphs Abs: 3 10*3/uL (ref 0.7–4.0)
MCHC: 33.5 g/dL (ref 30.0–36.0)
MCV: 96.8 fl (ref 78.0–100.0)
Monocytes Absolute: 0.5 10*3/uL (ref 0.1–1.0)
Monocytes Relative: 3.8 % (ref 3.0–12.0)
Neutro Abs: 9.7 10*3/uL — ABNORMAL HIGH (ref 1.4–7.7)
Neutrophils Relative %: 72.1 % (ref 43.0–77.0)
Platelets: 287 10*3/uL (ref 150.0–400.0)
RBC: 3.86 Mil/uL — ABNORMAL LOW (ref 3.87–5.11)
RDW: 13.3 % (ref 11.5–14.6)
WBC: 13.4 10*3/uL — ABNORMAL HIGH (ref 4.5–10.5)

## 2011-05-19 LAB — BASIC METABOLIC PANEL
BUN: 14 mg/dL (ref 6–23)
CO2: 29 mEq/L (ref 19–32)
Calcium: 8.8 mg/dL (ref 8.4–10.5)
Chloride: 100 mEq/L (ref 96–112)
Creatinine, Ser: 0.7 mg/dL (ref 0.4–1.2)
GFR: 93.47 mL/min (ref >60.00)
Glucose, Bld: 153 mg/dL — ABNORMAL HIGH (ref 70–99)
Potassium: 3.8 mEq/L (ref 3.5–5.1)
Sodium: 139 mEq/L (ref 135–145)

## 2011-05-19 MED ORDER — HYDROCORTISONE ACETATE 25 MG RE SUPP: RECTAL | Status: DC

## 2011-05-19 MED ORDER — METRONIDAZOLE 250 MG PO TABS: 250.0000 mg | ORAL_TABLET | Freq: Three times a day (TID) | ORAL | Status: AC

## 2011-05-19 MED ORDER — PEG-KCL-NACL-NASULF-NA ASC-C 100 G PO SOLR: ORAL | Status: DC

## 2011-05-19 MED ORDER — CIPROFLOXACIN HCL 250 MG PO TABS: 250.0000 mg | ORAL_TABLET | Freq: Two times a day (BID) | ORAL | Status: AC

## 2011-05-19 NOTE — Progress Notes (Signed)
. 05/19/2011 [name] [id number] 1961-07-01   HISTORY OF PRESENT ILLNESS:  Ms. Achey is a 50 year old female followed by Dr. Marina Goodell for history of GERD. She has a history of Nissen fundoplication.  Patient has multiple, significant medical problems and is on chronic immunosuppressants in the form of Prednisone and Humira for sarcoidosis. Patient presents with a three week history of diffuse, mid abdominal pain associated with increased bowel movements without diarrhea. Stools are "fuzzy".  Pain constant, wakes her up at night and is unrelieved with defecation. She has seen some blood on the tissue. Symptoms reminiscent of when she had salmonella years ago. No fevers, no nausea. No sick contacts. No recent antibiotics. She has been visiting her mom in the hospital recently but symptoms predate that.  Also, having some chest discomfort relieved with nitroglycerin.   Past Medical History  Diagnosis Date  . Sarcoidosis 04/10/2007  . DIABETES MELLITUS, TYPE II 08/21/2006  . HYPERLIPIDEMIA 08/21/2006  . GOUT 08/20/2010  . OBESITY 06/04/2009  . ANEMIA-UNSPECIFIED 09/18/2009  . HYPERTENSION 08/21/2006  . GERD 08/21/2006  . SLEEP APNEA 04/21/2009   Past Surgical History  Procedure Date  . Ventral hernia repair   . Nissen fundoplication   . Cholecystectomy   . Abdominal hysterectomy   . Knee arthroscopy     right    reports that she has never smoked. She has never used smokeless tobacco. She reports that she does not drink alcohol or use illicit drugs. family history includes Asthma in her mother; COPD in her mother; Diabetes in her father; Emphysema in her mother; Heart attack in her father; and Sarcoidosis in her maternal uncle. Allergies  Allergen Reactions  . Methotrexate     REACTION: peri-oral and buccal lesions.      Outpatient Encounter Prescriptions as of 05/19/2011  Medication Sig Dispense Refill  . Adalimumab (HUMIRA PEN Wel[place]) Injection every 2 weeks.       [name] allopurinol  (ZYLOPRIM) 100 MG tablet       . aspirin 81 MG tablet Take 81 mg by mouth daily.        [name] atenolol (TENORMIN) 50 MG tablet Take 1 tablet (50 mg total) by mouth daily.  30 tablet  5  . benzonatate (TESSALON) 100 MG capsule Take 100 mg by mouth 3 (three) times daily as needed.        [name] buPROPion (WELLBUTRIN XL) 300 MG 24 hr tablet TAKE ONE TABLET BY MOUTH ONE TIME DAILY  30 tablet  3  . chlorpheniramine-HYDROcodone (TUSSIONEX) 10-8 MG/5ML LQCR Take 5 mLs by mouth every 12 (twelve) hours.  140 mL  1  . Cholecalciferol (GNP VITAMIN D) 1000 UNITS tablet Take 1,000 Units by mouth daily.        . ciclesonide (OMNARIS) 50 MCG/ACT nasal spray 2 sprays by Each Nare route daily.        . cloNIDine (CATAPRES) 0.1 MG tablet       . DEXILANT 60 MG capsule TAKE ONE CAPSULE BY MOUTH ONE TIME DAILY  30 capsule  11  . estradiol (ESTRACE) 1 MG tablet Take 2 mg by mouth daily.       . furosemide (LASIX) 20 MG tablet TAKE ONE TABLET BY MOUTH ONE TIME DAILY  30 tablet  2  . glucose blood (FREESTYLE LITE) test strip Use as instructed  100 each  12  . HYDROcodone-acetaminophen (NORCO) 5-325 MG per tablet Take 2 tablets by mouth every 6 (six) hours as needed for pain.  20  tablet  0  . lisinopril (PRINIVIL,ZESTRIL) 5 MG tablet Take 1 tablet (5 mg total) by mouth daily.  30 tablet  5  . LORazepam (ATIVAN) 1 MG tablet Take 1 tablet (1 mg total) by mouth 2 (two) times daily as needed.  30 tablet  0  . metFORMIN (GLUCOPHAGE) 500 MG tablet Take 1 tablet (500 mg total) by mouth 2 (two) times daily with a meal.  60 tablet  5  . metoCLOPramide (REGLAN) 10 MG tablet 1 tablet 30 minutes before meals and at bedtime  50 tablet  0  . nystatin-triamcinolone (MYCOLOG II) cream Apply 100,000 g topically 2 (two) times daily as needed.        . potassium chloride SA (K-DUR,KLOR-CON) 20 MEQ tablet TAKE ONE TABLET BY MOUTH THREE TIMES DAILY  90 tablet  2  . predniSONE (DELTASONE) 5 MG tablet Take 5 mg by mouth daily.       [name] venlafaxine  (EFFEXOR) 75 MG tablet Take 75 mg by mouth 2 (two) times daily.        . cycloSPORINE (RESTASIS) 0.05 % ophthalmic emulsion Place 1 drop into both eyes daily.        [name] loteprednol (LOTEMAX) 0.5 % ophthalmic suspension Place 1 drop into both eyes every 6 (six) hours.        . predniSONE (DELTASONE) 10 MG tablet 4, 2, 1 daily each for 4 days and then resume usual dose of 5 mg daily  30 tablet  0     REVIEW OF SYSTEMS  : All other systems reviewed and negative except where noted in the History of Present Illness.   PHYSICAL EXAM: General:  Pleasant, obese, white female in no acute distress Head: Normocephalic and atraumatic Eyes:  sclerae anicteric,conjunctive pink. Ears: Normal auditory acuity Mouth: No deformity or lesions Neck: Supple, no masses.  Lungs: Clear throughout to auscultation Heart: Regular rate and rhythm; no murmurs heard Abdomen: Soft, obese, mild diffuse mid abdominal tenderness. No masses or hepatomegaly noted. Normal Bowel sounds Rectal: Slightly edematous anal canal with a few swollen hemorrhoids. Musculoskeletal: Symmetrical with no gross deformities  Skin: No lesions on visible extremities Extremities: No edema or deformities noted Neurological: Alert oriented x 4, grossly non focal Cervical Nodes:  No significant cervical adenopathy Psychological:  Alert and cooperative. Normal mood and affect  ASSESSMENT AND PLAN;

## 2011-05-19 NOTE — Assessment & Plan Note (Addendum)
Increased frequency of stools associated with mid abdominal pain. Stools are soft, unformed. Rule out infection etiology in this immunocompromise patient. Will check stool studies. She's having some mild rectal bleeding which is likely perianal in nature. Check CBC, electrolytes. Bland diet. Patient has multiple medical problems and is on multiple medications. Once stool studies submitted will give 10 day course of Cipro and Flagyl. Further recommendations based on test results / clinical course. Of note, patient doesn't think she has had a colonoscopy in several years. She had one in 2006 with plans to repeat in five years. If this is the case then she is due for repeat colonoscopy.   Note:  After patient left I discovered that patient already had her follow up colonoscopy July 2011 so she is up to date on CRC screenings. Will call patient and cancel colonoscopy.

## 2011-05-20 ENCOUNTER — Other Ambulatory Visit: Payer: BC Managed Care – PPO

## 2011-05-20 ENCOUNTER — Telehealth: Payer: Self-pay | Admitting: Nurse Practitioner

## 2011-05-20 DIAGNOSIS — R194 Change in bowel habit: Secondary | ICD-10-CM

## 2011-05-20 DIAGNOSIS — R1084 Generalized abdominal pain: Secondary | ICD-10-CM

## 2011-05-20 NOTE — Assessment & Plan Note (Addendum)
Three-week history of diffuse mid abdominal pain and increased frequency of stools without diarrhea. Suspect infectious etiology in this patient who is chronically immunosuppressed. See "bowel change"

## 2011-05-20 NOTE — Progress Notes (Signed)
Agree with Ms. Dorice Lamas assessment and plans.

## 2011-05-20 NOTE — Telephone Encounter (Signed)
Spoke with patient and gave her lab results. She reports she is still having pain and frequent BM's. She sent the stool specimens in today. She will start the antibiotics. Scheduled patient with Dr. Marina Goodell on 06/15/11 at 11:15AM.

## 2011-05-20 NOTE — Telephone Encounter (Signed)
Message copied by Daphine Deutscher on Thu May 20, 2011  3:36 PM ------      Message from: Meredith Pel      Created: Thu May 20, 2011  1:31 PM       Rene Kocher, her WBC is high (could be steroid related though WBC wasn't high before). Please see how patient is doing and if still having pain and frequent BMs then have her start the Cipro and Flagyl we sent to pharmacy (assuming she has submitted her stool studies). Also, please make sure she has a follow up appt. With primary GI, or me in about 2-3 weeks. Thanks

## 2011-05-21 LAB — FECAL LACTOFERRIN, QUANT: Lactoferrin: NEGATIVE

## 2011-05-21 LAB — CLOSTRIDIUM DIFFICILE BY PCR: Toxigenic C. Difficile by PCR: NOT DETECTED

## 2011-05-21 LAB — OVA AND PARASITE SCREEN: OP: NONE SEEN

## 2011-05-24 LAB — STOOL CULTURE

## 2011-05-25 IMAGING — CR DG CHEST 2V
2 series · 2 of 2 positions shown · non-contrast
Comparison: 04/28/2009

CLINICAL DATA: Chest pain

CHEST - 2 VIEW

[w chest pa]
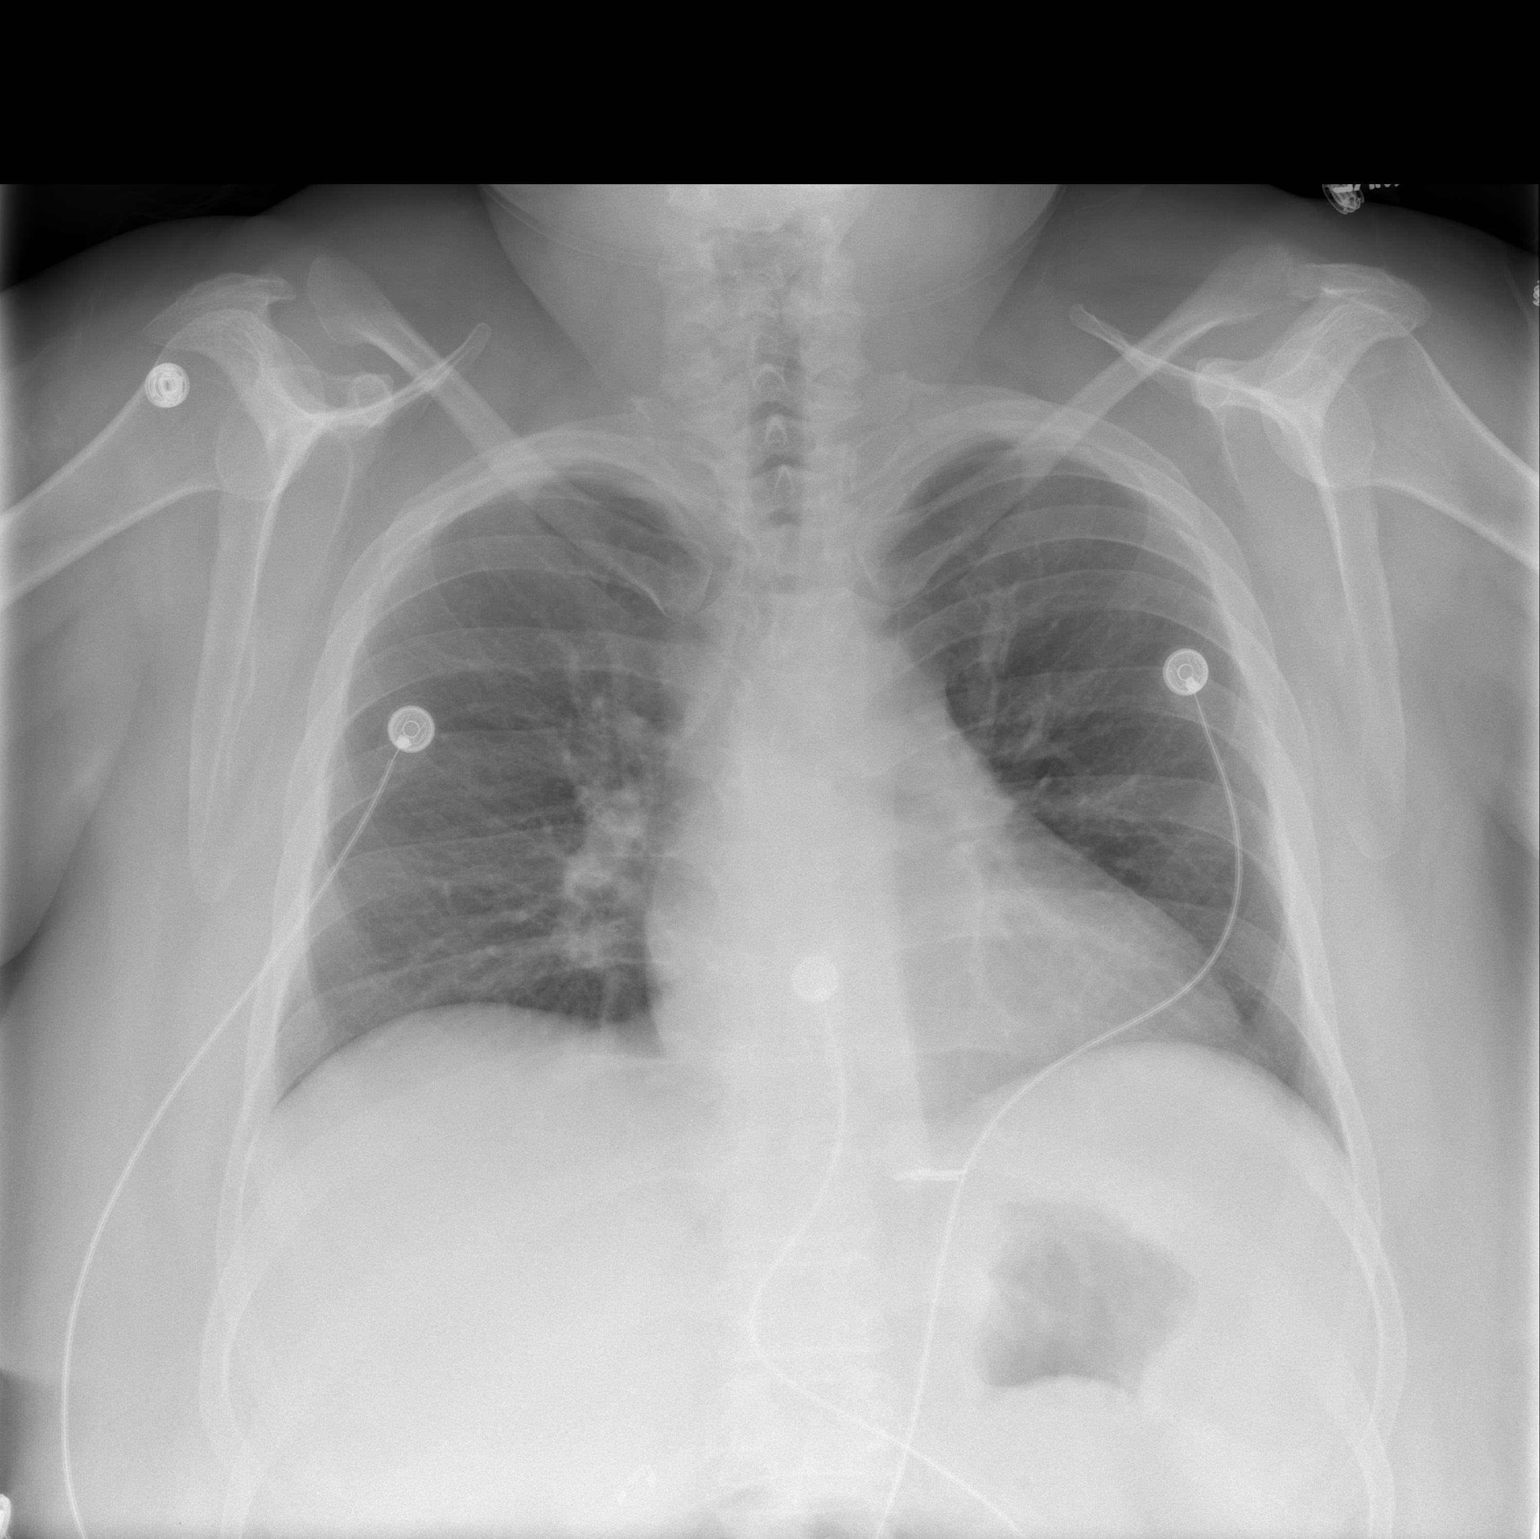

[w chest lat]
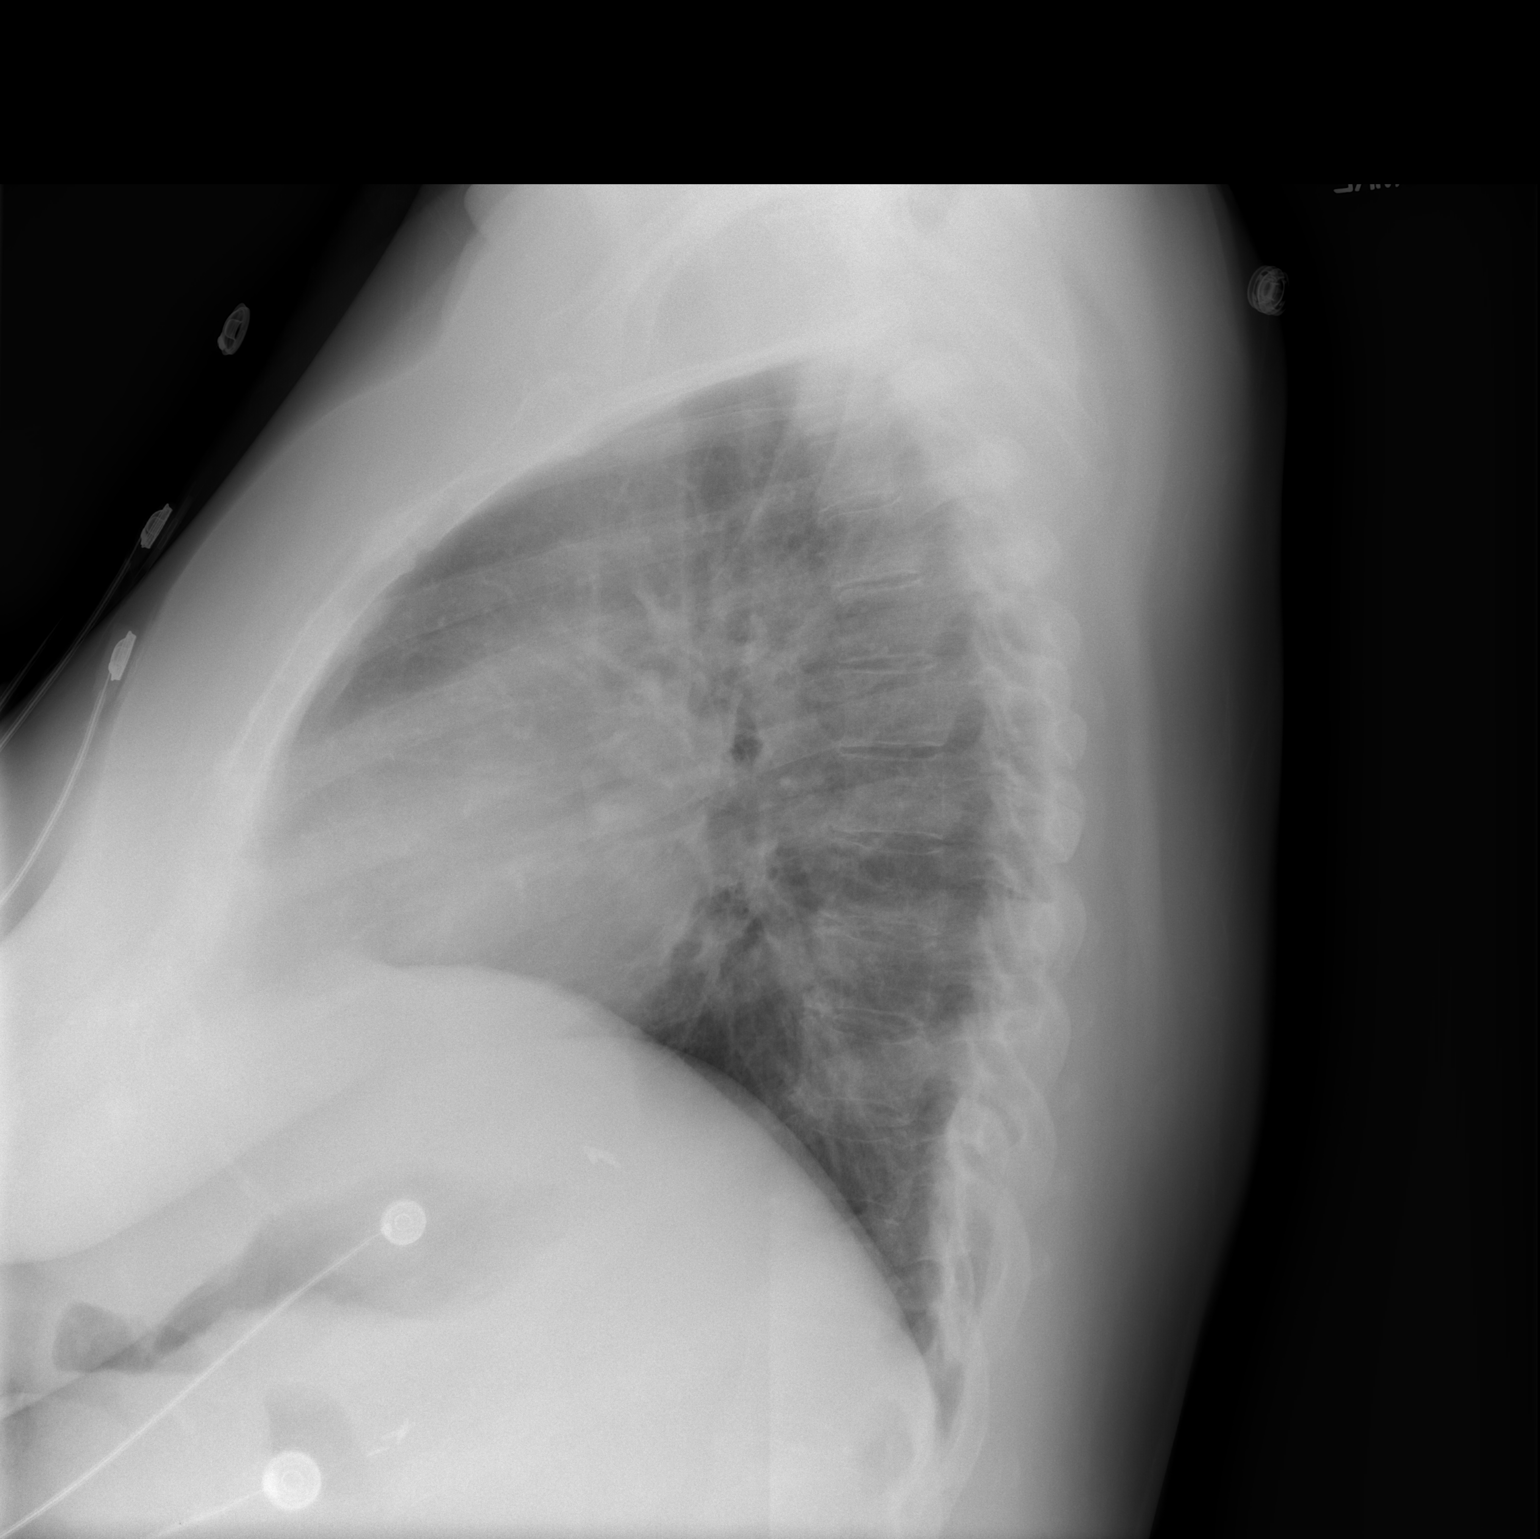

[2 of 2 positions shown; findings below may reference images not displayed]

FINDINGS: Low lung volumes.  Vascular clips in the upper abdomen.
Lungs clear.  Heart size and pulmonary vascularity normal.  No
effusion.  Visualized bones unremarkable.
IMPRESSION: No acute disease

## 2011-05-26 ENCOUNTER — Telehealth: Payer: Self-pay | Admitting: Internal Medicine

## 2011-05-26 IMAGING — CR DG ANKLE COMPLETE 3+V*L*
1 series · 3 of 3 positions shown · non-contrast
Comparison: None.

CLINICAL DATA: Ankle pain and swelling, no injury

LEFT ANKLE COMPLETE - 3+ VIEW

[Series 1: series (date) · left · 3 of 3 slices shown]
[im 1/3]
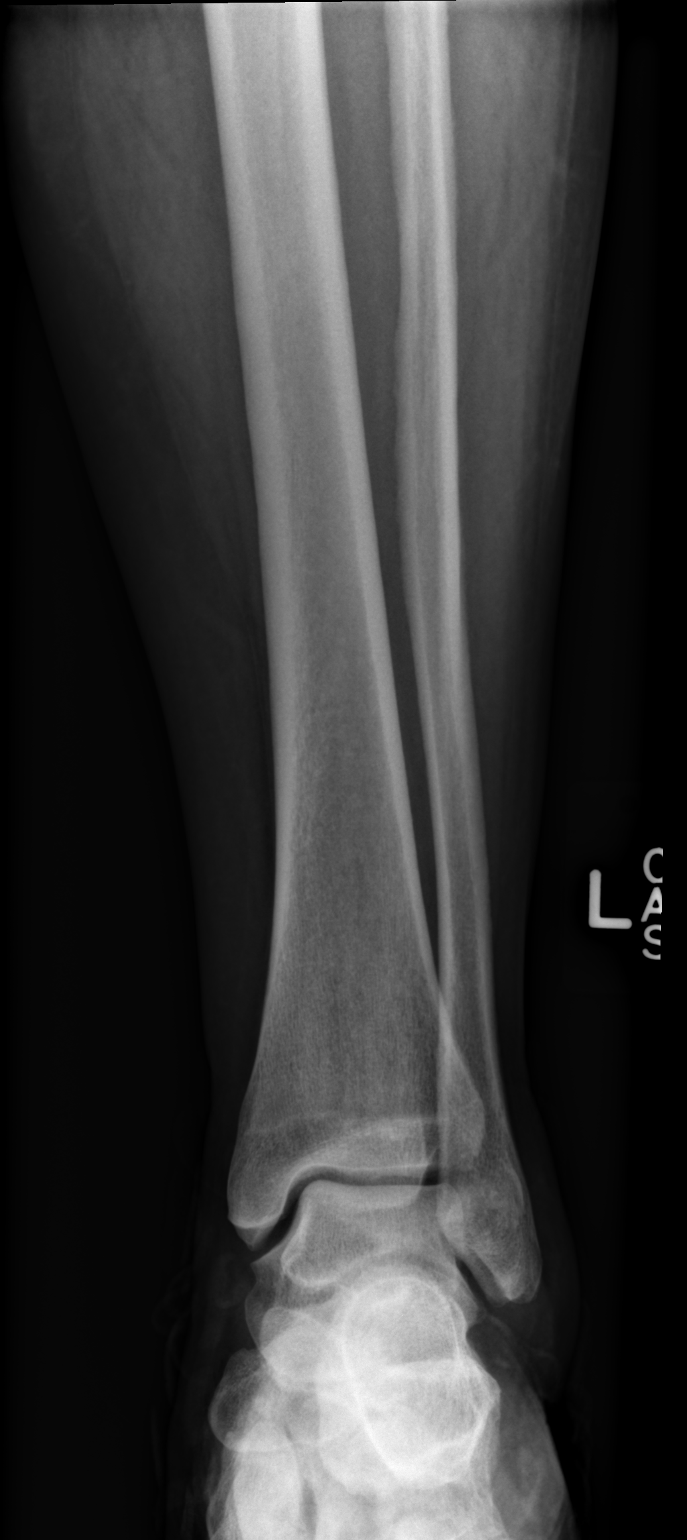
[im 2/3]
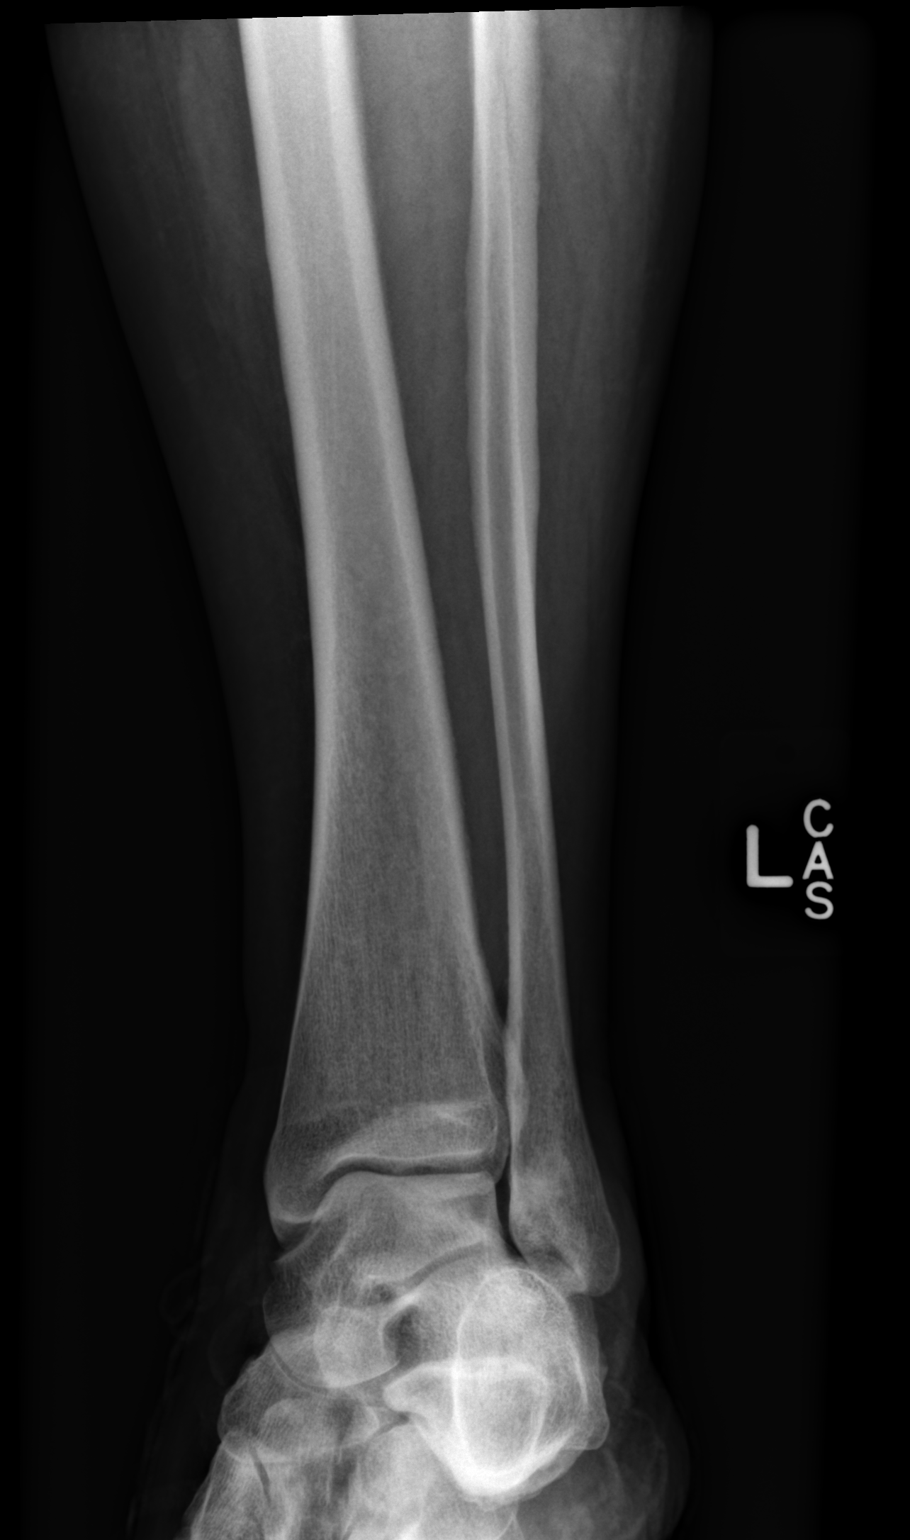
[im 3/3]
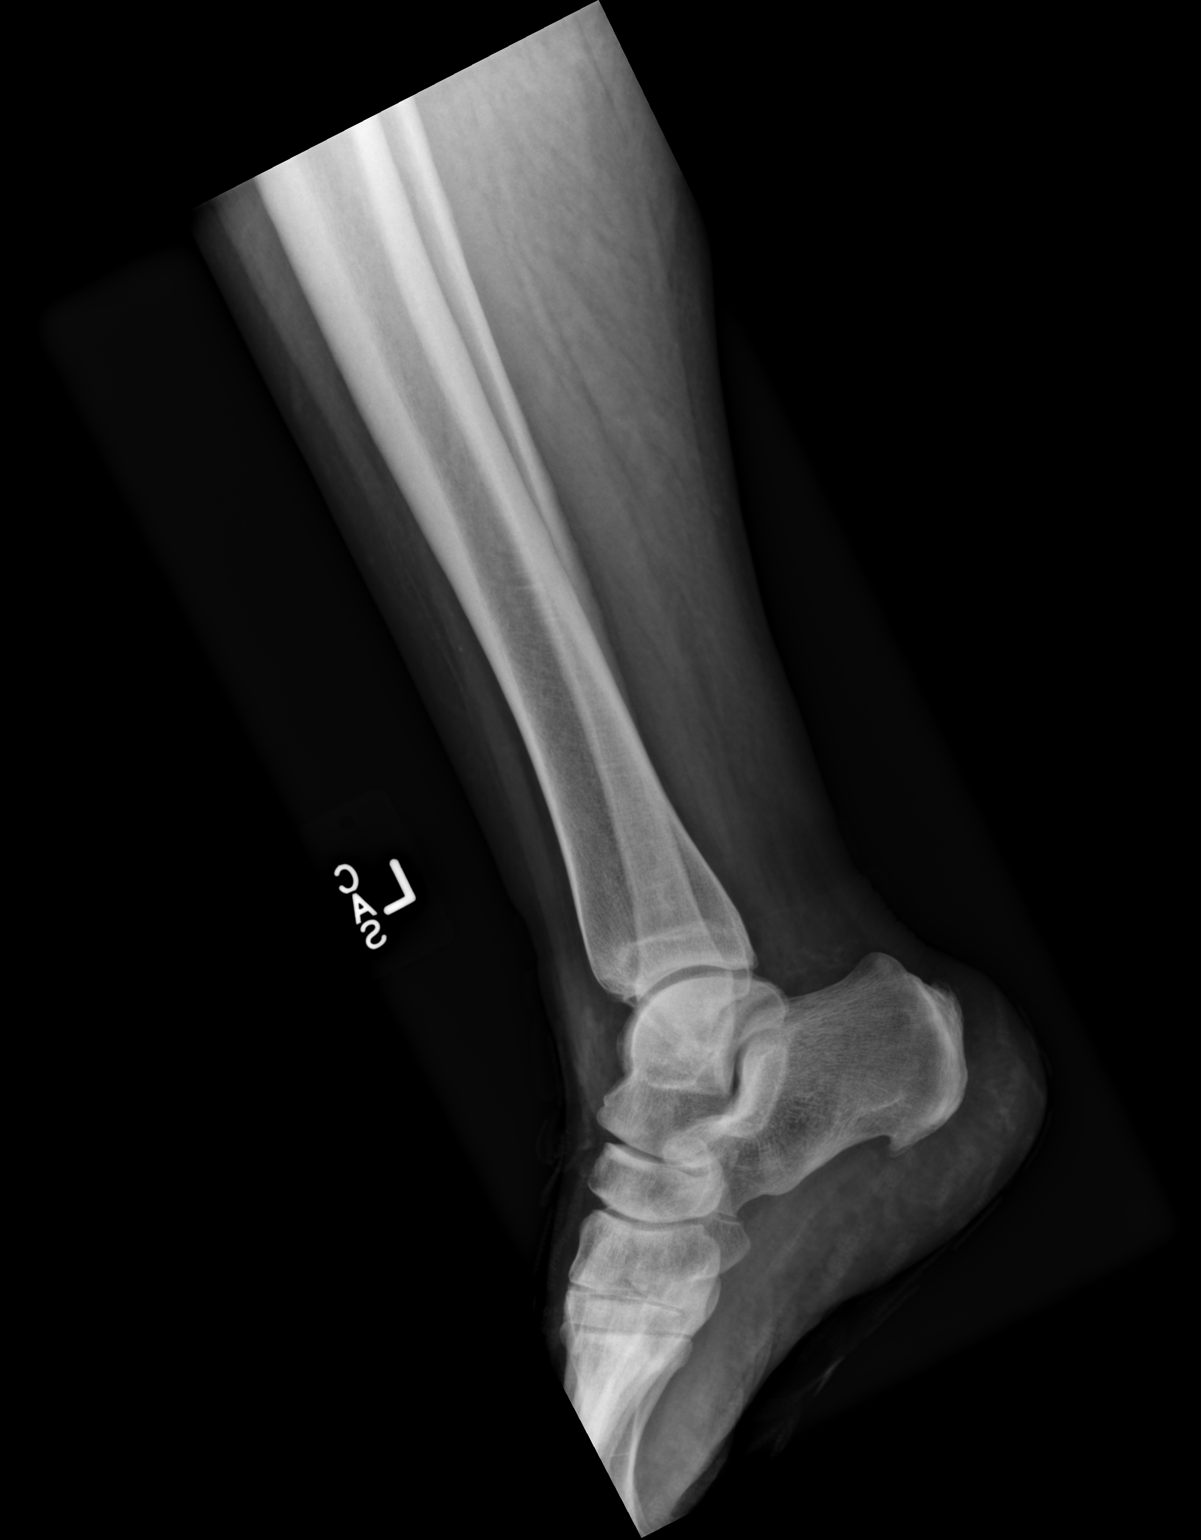

[3 of 3 positions shown; findings below may reference images not displayed]

FINDINGS: The ankle joint is normal.  Alignment is normal.  No
acute bony abnormality is seen.  A small plantar calcaneal
degenerative spur is noted.
IMPRESSION: No acute abnormality.

## 2011-05-26 NOTE — Telephone Encounter (Signed)
[name] complaining of the same pain that she had when she saw Willette Cluster NP. State she is still going to the bathroom with loose stools 3-4 times a day. States the pain comes and goes. Pt reports this is the same pain she had when she had a CT scan done in May. That CT showed hepatic steatosis. Spoke with Willette Cluster NP, she wants pt to come in a have her CBC and CMET checked, want to check white count. All stool cultures negative. Pt aware.

## 2011-05-26 NOTE — Telephone Encounter (Signed)
Left message for pt to call back  °

## 2011-05-27 ENCOUNTER — Observation Stay (HOSPITAL_COMMUNITY)
Admission: EM | Admit: 2011-05-27 | Discharge: 2011-05-28 | DRG: 143 | Disposition: A | Discharge status: Home / Self Care | Payer: BC Managed Care – PPO | Attending: Cardiology | Admitting: Cardiology

## 2011-05-27 ENCOUNTER — Encounter (HOSPITAL_COMMUNITY): Payer: Self-pay | Admitting: Radiology

## 2011-05-27 ENCOUNTER — Emergency Department (HOSPITAL_COMMUNITY): Payer: BC Managed Care – PPO

## 2011-05-27 ENCOUNTER — Other Ambulatory Visit (INDEPENDENT_AMBULATORY_CARE_PROVIDER_SITE_OTHER): Payer: BC Managed Care – PPO

## 2011-05-27 DIAGNOSIS — E669 Obesity, unspecified: Secondary | ICD-10-CM | POA: Diagnosis present

## 2011-05-27 DIAGNOSIS — D869 Sarcoidosis, unspecified: Secondary | ICD-10-CM | POA: Diagnosis present

## 2011-05-27 DIAGNOSIS — E785 Hyperlipidemia, unspecified: Secondary | ICD-10-CM | POA: Diagnosis present

## 2011-05-27 DIAGNOSIS — I2789 Other specified pulmonary heart diseases: Secondary | ICD-10-CM | POA: Diagnosis present

## 2011-05-27 DIAGNOSIS — M109 Gout, unspecified: Secondary | ICD-10-CM | POA: Diagnosis present

## 2011-05-27 DIAGNOSIS — K219 Gastro-esophageal reflux disease without esophagitis: Secondary | ICD-10-CM | POA: Diagnosis present

## 2011-05-27 DIAGNOSIS — IMO0002 Reserved for concepts with insufficient information to code with codable children: Secondary | ICD-10-CM

## 2011-05-27 DIAGNOSIS — Z794 Long term (current) use of insulin: Secondary | ICD-10-CM

## 2011-05-27 DIAGNOSIS — E876 Hypokalemia: Secondary | ICD-10-CM | POA: Diagnosis not present

## 2011-05-27 DIAGNOSIS — R0789 Other chest pain: Principal | ICD-10-CM | POA: Diagnosis present

## 2011-05-27 DIAGNOSIS — E119 Type 2 diabetes mellitus without complications: Secondary | ICD-10-CM | POA: Diagnosis present

## 2011-05-27 DIAGNOSIS — R109 Unspecified abdominal pain: Secondary | ICD-10-CM

## 2011-05-27 DIAGNOSIS — I10 Essential (primary) hypertension: Secondary | ICD-10-CM | POA: Diagnosis present

## 2011-05-27 DIAGNOSIS — G4733 Obstructive sleep apnea (adult) (pediatric): Secondary | ICD-10-CM | POA: Diagnosis present

## 2011-05-27 DIAGNOSIS — Z79899 Other long term (current) drug therapy: Secondary | ICD-10-CM

## 2011-05-27 DIAGNOSIS — F3289 Other specified depressive episodes: Secondary | ICD-10-CM | POA: Diagnosis present

## 2011-05-27 DIAGNOSIS — F329 Major depressive disorder, single episode, unspecified: Secondary | ICD-10-CM | POA: Diagnosis present

## 2011-05-27 LAB — COMPREHENSIVE METABOLIC PANEL
ALT: 20 U/L (ref 0–35)
ALT: 20 U/L (ref 0–35)
AST: 21 U/L (ref 0–37)
AST: 20 U/L (ref 0–37)
Albumin: 3.9 g/dL (ref 3.5–5.2)
Albumin: 4 g/dL (ref 3.5–5.2)
Alkaline Phosphatase: 61 U/L (ref 39–117)
Alkaline Phosphatase: 68 U/L (ref 39–117)
BUN: 14 mg/dL (ref 6–23)
BUN: 11 mg/dL (ref 6–23)
CO2: 31 mEq/L (ref 19–32)
CO2: 30 mEq/L (ref 19–32)
Calcium: 8.8 mg/dL (ref 8.4–10.5)
Calcium: 9.5 mg/dL (ref 8.4–10.5)
Chloride: 98 mEq/L (ref 96–112)
Chloride: 95 mEq/L — ABNORMAL LOW (ref 96–112)
Creatinine, Ser: 0.7 mg/dL (ref 0.4–1.2)
Creatinine, Ser: 0.5 mg/dL (ref 0.50–1.10)
GFR calc Af Amer: >60 mL/min (ref >60)
GFR calc non Af Amer: >60 mL/min (ref >60)
GFR: 96.64 mL/min (ref >60.00)
Glucose, Bld: 198 mg/dL — ABNORMAL HIGH (ref 70–99)
Glucose, Bld: 161 mg/dL — ABNORMAL HIGH (ref 70–99)
Potassium: 4.4 mEq/L (ref 3.5–5.1)
Potassium: 3.8 mEq/L (ref 3.5–5.1)
Sodium: 137 mEq/L (ref 135–145)
Sodium: 136 mEq/L (ref 135–145)
Total Bilirubin: 0.4 mg/dL (ref 0.3–1.2)
Total Bilirubin: 0.4 mg/dL (ref 0.3–1.2)
Total Protein: 6.7 g/dL (ref 6.0–8.3)
Total Protein: 7.1 g/dL (ref 6.0–8.3)

## 2011-05-27 LAB — CBC WITH DIFFERENTIAL/PLATELET
Basophils Absolute: 0.1 10*3/uL (ref 0.0–0.1)
Basophils Relative: 0.6 % (ref 0.0–3.0)
Eosinophils Absolute: 0.3 10*3/uL (ref 0.0–0.7)
Eosinophils Relative: 2.6 % (ref 0.0–5.0)
HCT: 37.3 % (ref 36.0–46.0)
Hemoglobin: 12.4 g/dL (ref 12.0–15.0)
Lymphocytes Relative: 37.6 % (ref 12.0–46.0)
Lymphs Abs: 3.8 10*3/uL (ref 0.7–4.0)
MCHC: 33.3 g/dL (ref 30.0–36.0)
MCV: 96.9 fl (ref 78.0–100.0)
Monocytes Absolute: 0.6 10*3/uL (ref 0.1–1.0)
Monocytes Relative: 6 % (ref 3.0–12.0)
Neutro Abs: 5.4 10*3/uL (ref 1.4–7.7)
Neutrophils Relative %: 53.2 % (ref 43.0–77.0)
Platelets: 246 10*3/uL (ref 150.0–400.0)
RBC: 3.85 Mil/uL — ABNORMAL LOW (ref 3.87–5.11)
RDW: 12.9 % (ref 11.5–14.6)
WBC: 10.1 10*3/uL (ref 4.5–10.5)

## 2011-05-27 LAB — DIFFERENTIAL
Basophils Absolute: 0.1 10*3/uL (ref 0.0–0.1)
Basophils Relative: 0 % (ref 0–1)
Eosinophils Absolute: 0.2 10*3/uL (ref 0.0–0.7)
Eosinophils Relative: 2 % (ref 0–5)
Lymphocytes Relative: 32 % (ref 12–46)
Lymphs Abs: 3.8 10*3/uL (ref 0.7–4.0)
Monocytes Absolute: 0.6 10*3/uL (ref 0.1–1.0)
Monocytes Relative: 5 % (ref 3–12)
Neutro Abs: 7.3 10*3/uL (ref 1.7–7.7)
Neutrophils Relative %: 61 % (ref 43–77)

## 2011-05-27 LAB — CBC
HCT: 36.9 % (ref 36.0–46.0)
Hemoglobin: 13 g/dL (ref 12.0–15.0)
MCH: 32.6 pg (ref 26.0–34.0)
MCHC: 35.2 g/dL (ref 30.0–36.0)
MCV: 92.5 fL (ref 78.0–100.0)
Platelets: 300 10*3/uL (ref 150–400)
RBC: 3.99 MIL/uL (ref 3.87–5.11)
RDW: 12.7 % (ref 11.5–15.5)
WBC: 11.9 10*3/uL — ABNORMAL HIGH (ref 4.0–10.5)

## 2011-05-27 LAB — CK TOTAL AND CKMB (NOT AT ARMC)
CK, MB: 2.2 ng/mL (ref 0.3–4.0)
Relative Index: INVALID (ref 0.0–2.5)
Total CK: 38 U/L (ref 7–177)

## 2011-05-27 LAB — POCT I-STAT TROPONIN I
Troponin i, poc: 0 ng/mL (ref 0.00–0.08)
Troponin i, poc: 0 ng/mL (ref 0.00–0.08)

## 2011-05-27 LAB — LIPASE, BLOOD: Lipase: 25 U/L (ref 11–59)

## 2011-05-27 LAB — TROPONIN I: Troponin I: <0.3 ng/mL (ref <0.30)

## 2011-05-27 LAB — PRO B NATRIURETIC PEPTIDE: Pro B Natriuretic peptide (BNP): 76.1 pg/mL (ref 0–125)

## 2011-05-27 IMAGING — CT CT CHEST W/ CM
1 series · 16 of 31 positions shown, 20 images · IV contrast (agent unspecified)
Comparison: 04/28/2009

CLINICAL DATA: Left-sided chest pain.  Sarcoidosis.

CT CHEST WITH CONTRAST
TECHNIQUE: Multidetector CT imaging of the chest was performed Bim
Ban intravenous contrast   administration.
Contrast: 80 ml Qmnipaque-4II

[Series 2: rtn chest with st · axial · 0.70mm/px · z∈[-500,-224]mm · 16 of 61 slices shown, 20 images]
[im 3/61  mediastinal]
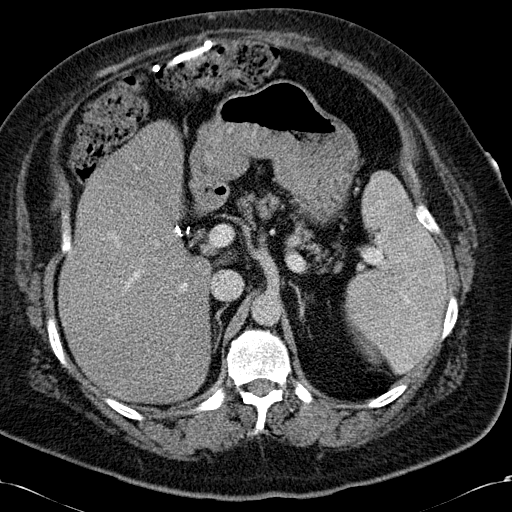
[im 3/61  lung]
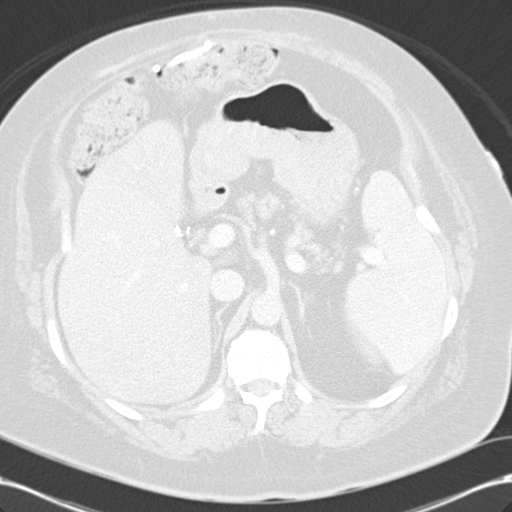
[im 7/61  lung]
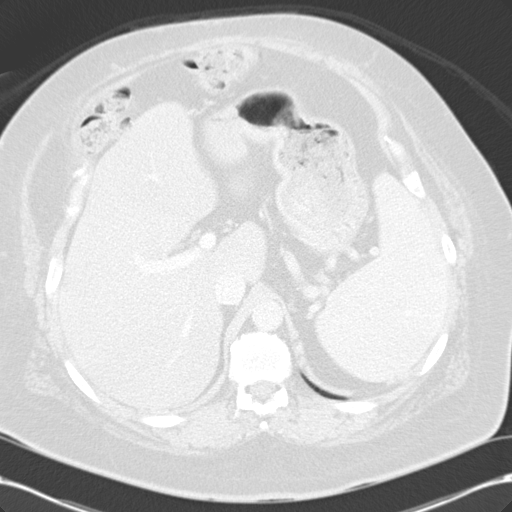
[im 12/61  lung]
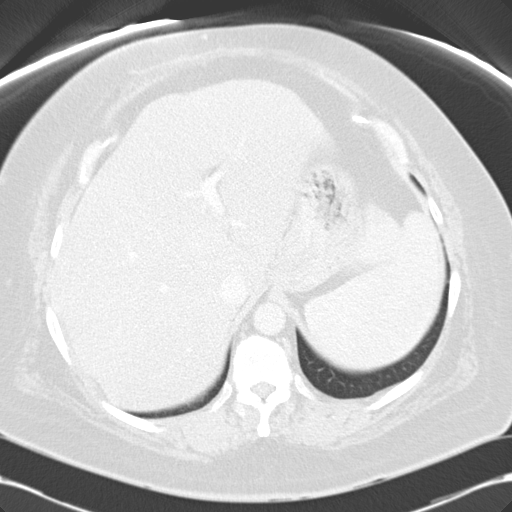
[im 14/61  lung]
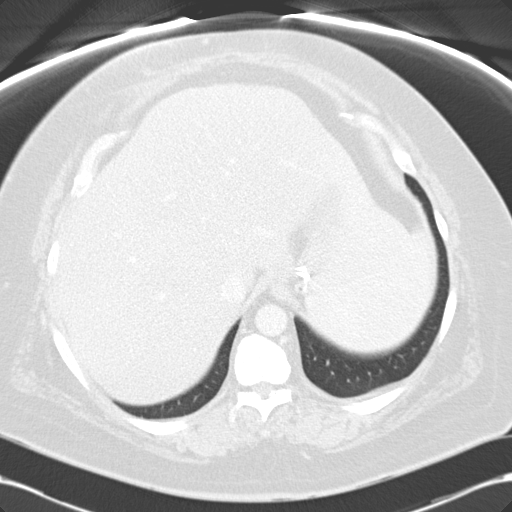
[im 18/61  mediastinal]
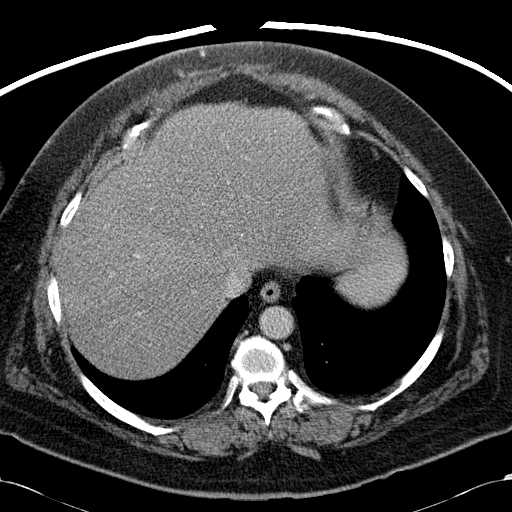
[im 18/61  lung]
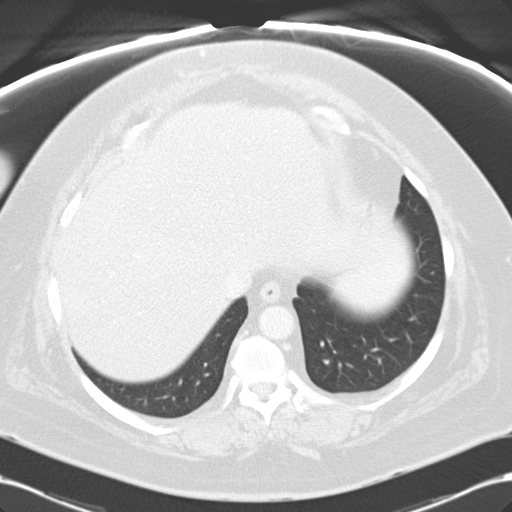
[im 21/61  lung]
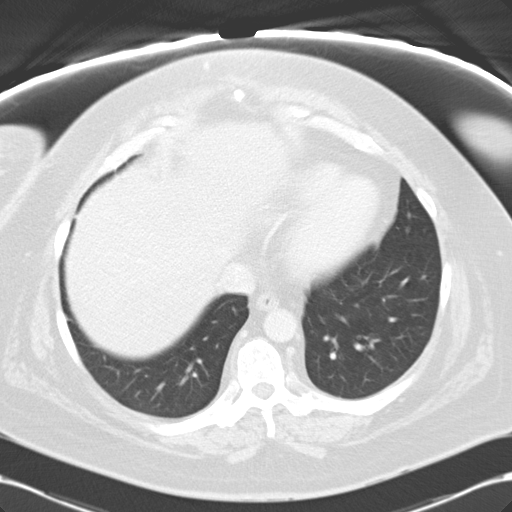
[im 25/61  lung]
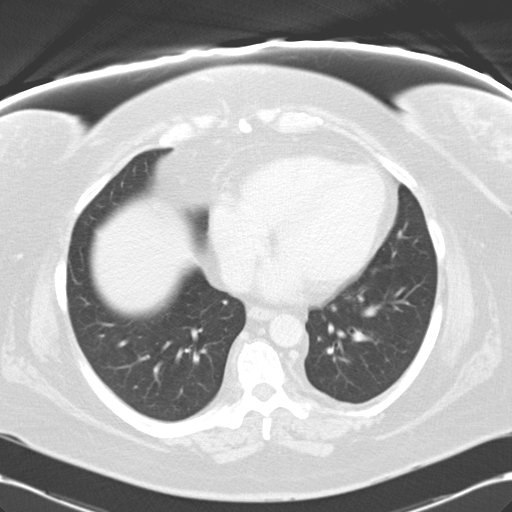
[im 29/61  lung]
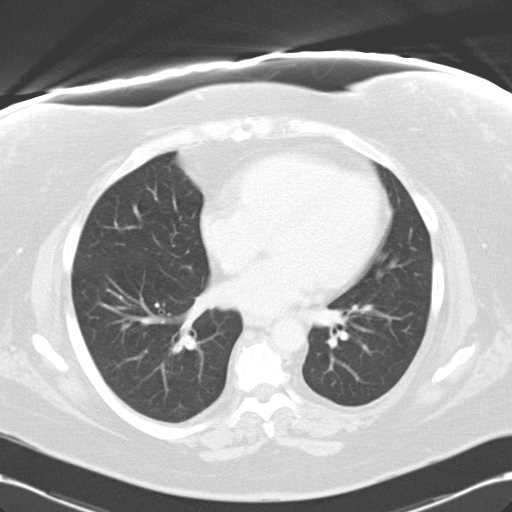
[im 33/61  mediastinal]
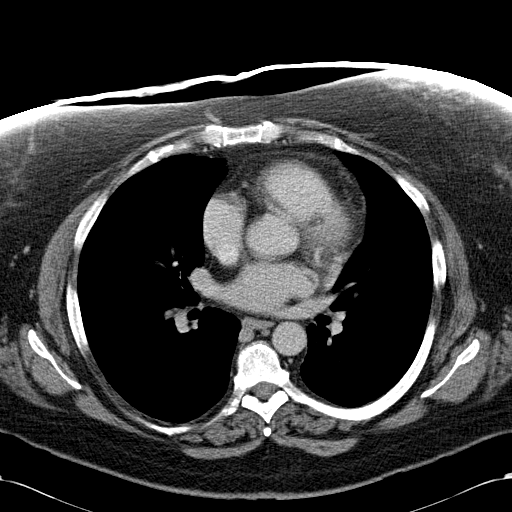
[im 33/61  lung]
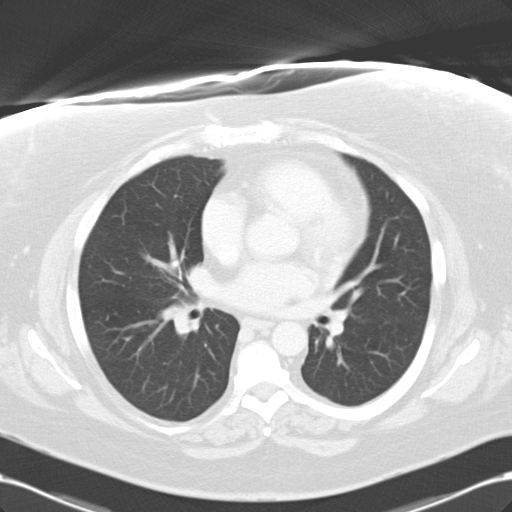
[im 36/61  lung]
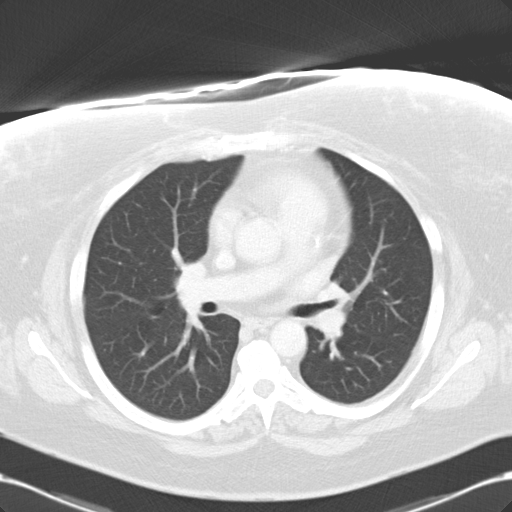
[im 38/61  lung]
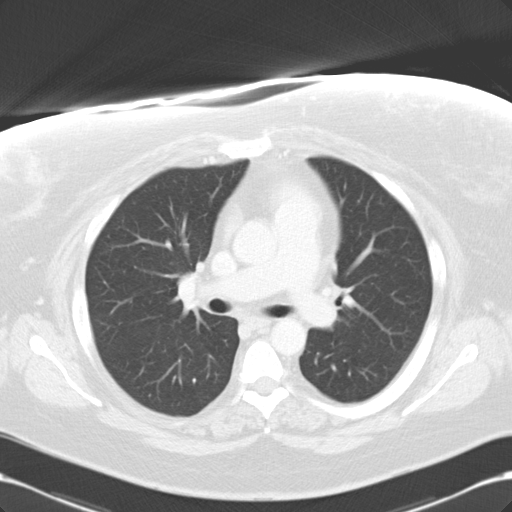
[im 41/61  lung]
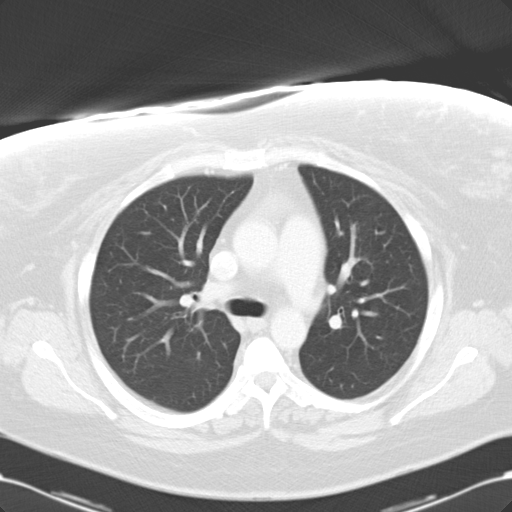
[im 45/61  mediastinal]
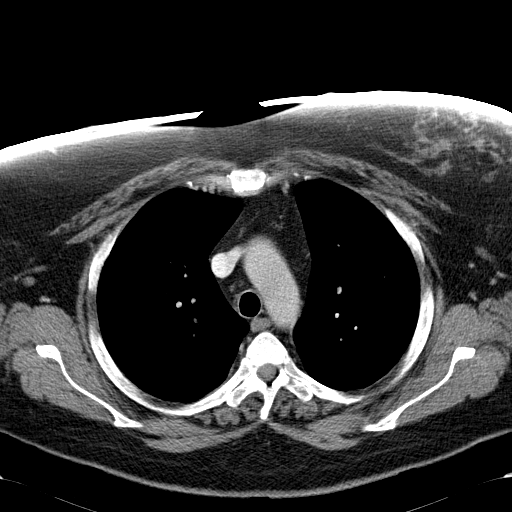
[im 45/61  lung]
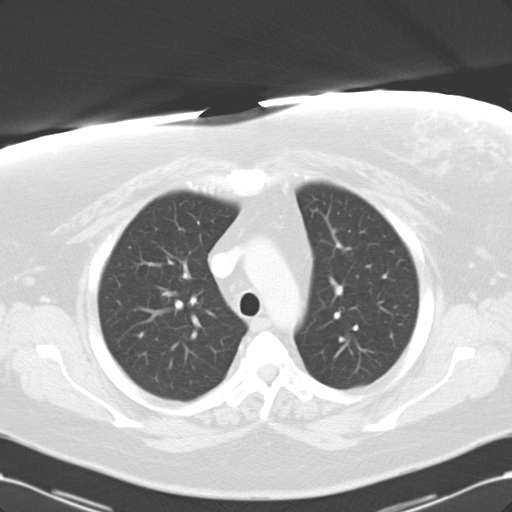
[im 49/61  lung]
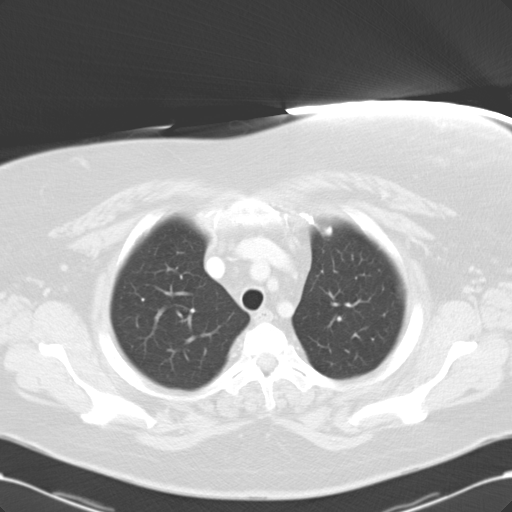
[im 54/61  lung]
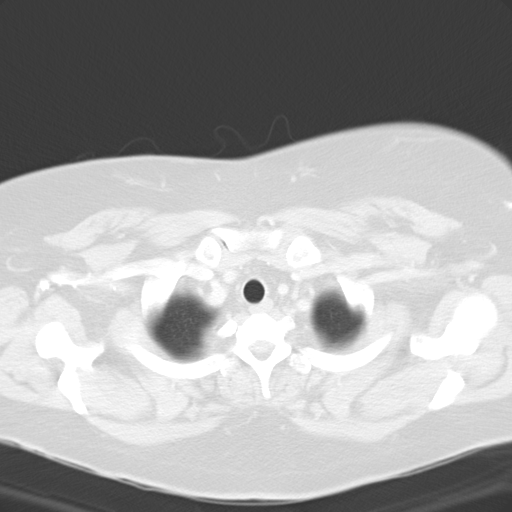
[im 58/61  lung]
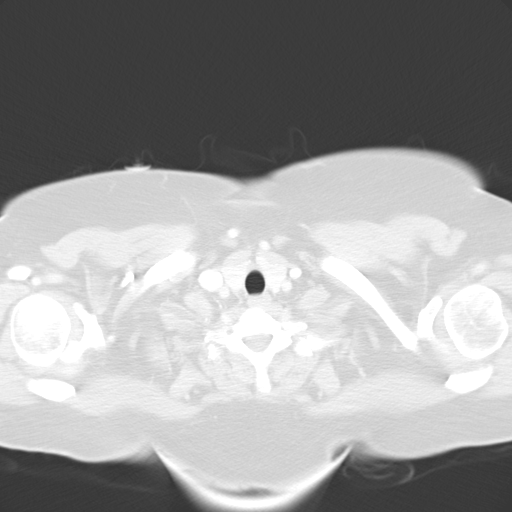

[16 of 31 positions shown; findings below may reference images not displayed]

FINDINGS: No evidence of mediastinal or hilar masses.  No
adenopathy elsewhere within the thorax.

No evidence of pleural or pericardial effusion.  No evidence of
pulmonary infiltrate or mass.  Hepatic steatosis incidentally
noted.
IMPRESSION: 1.  No evidence of lymphadenopathy or other active disease within
the thorax.
2.  Hepatic steatosis incidentally noted.

## 2011-05-27 IMAGING — CT CT CERVICAL SPINE W/O CM
3 series · 13 of 20 positions shown, 15 images · non-contrast
Comparison: None

CLINICAL DATA: Left arm and shoulder numbness.

CT CERVICAL SPINE WITHOUT CONTRAST
TECHNIQUE: Multidetector CT imaging of the cervical spine was
performed without intravenous contrast.  Multiplanar CT image
reconstructions were also generated.

[Series 3: c_spine 2.0 b31s · axial · 0.23mm/px · z∈[-242,-116]mm · 5 of 95 slices shown]
[im 16/95  bone]
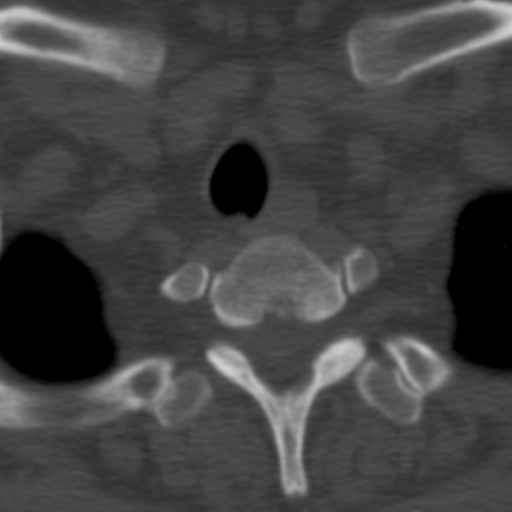
[im 32/95  bone]
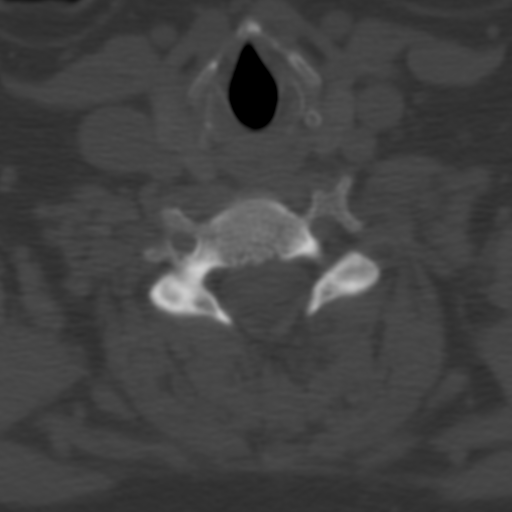
[im 48/95  bone]
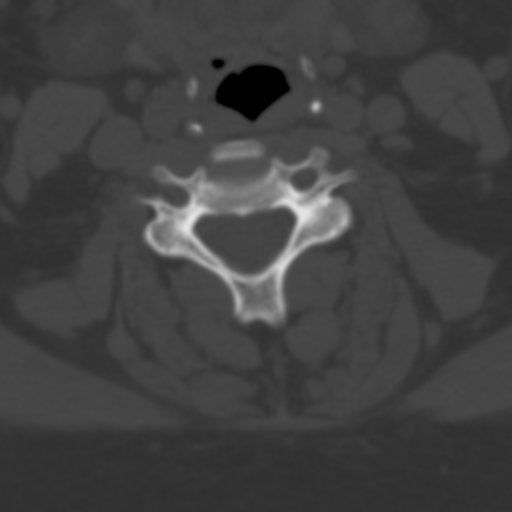
[im 63/95  bone]
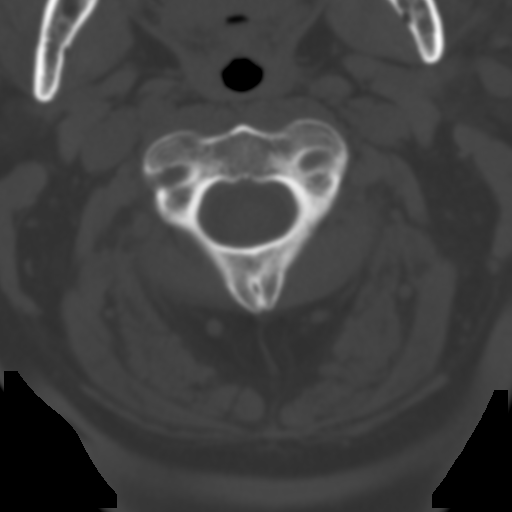
[im 79/95  bone]
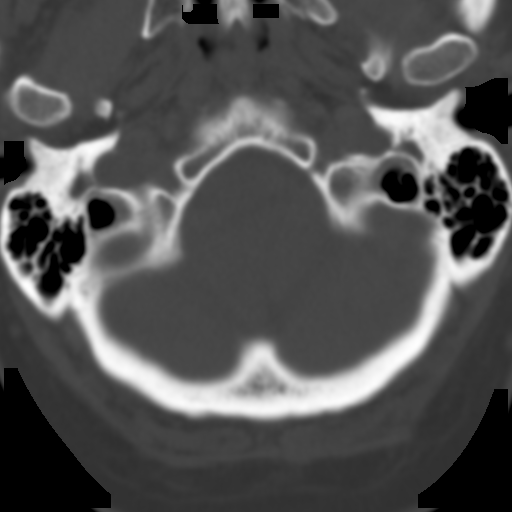

[Series 602: <mpr thick range> · coronal · 0.37mm/px · 3 of 49 slices shown]
[im 10/49  bone]
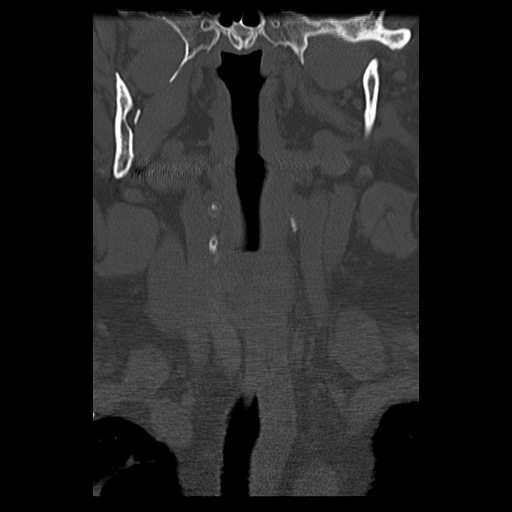
[im 20/49  bone]
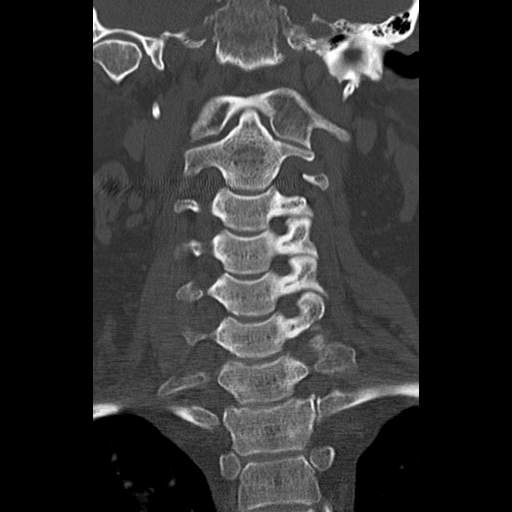
[im 29/49  bone]
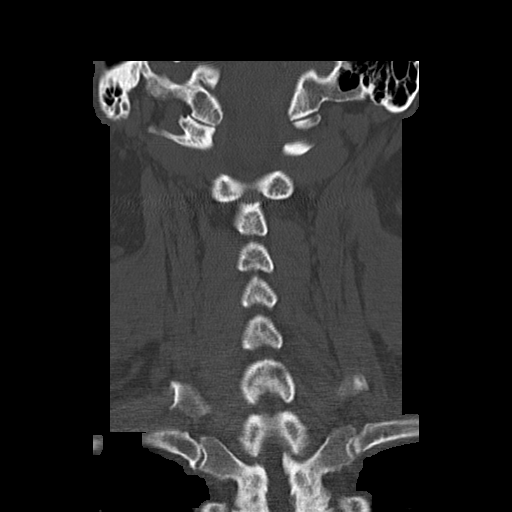

[Series 603: <mpr thick range(1)> · axial · 0.37mm/px · z∈[-278,-172]mm · 5 of 88 slices shown, 7 images]
[im 15/88  soft-tissue]
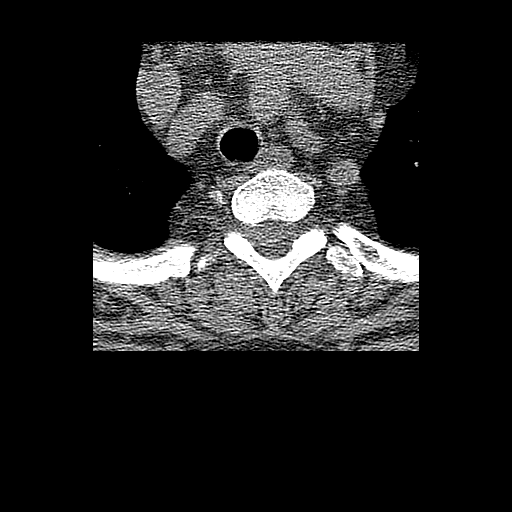
[im 15/88  bone]
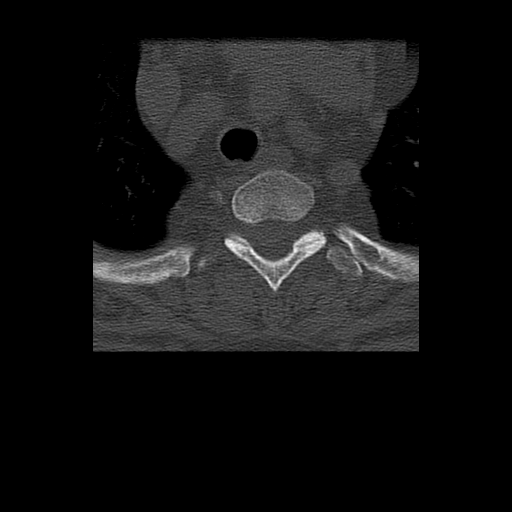
[im 30/88  bone]
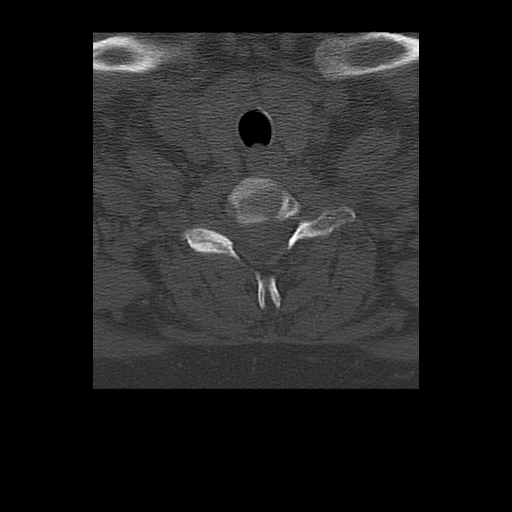
[im 44/88  bone]
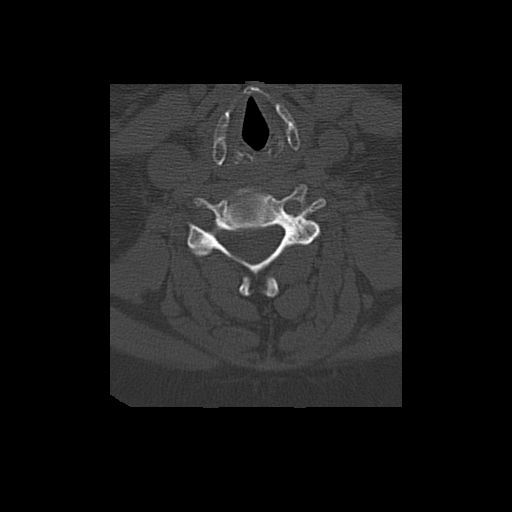
[im 59/88  bone]
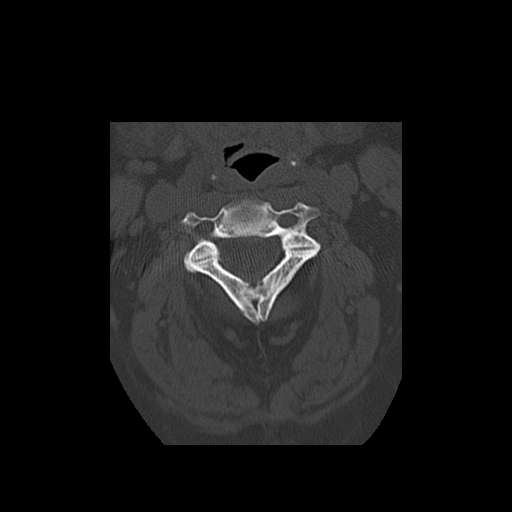
[im 73/88  soft-tissue]
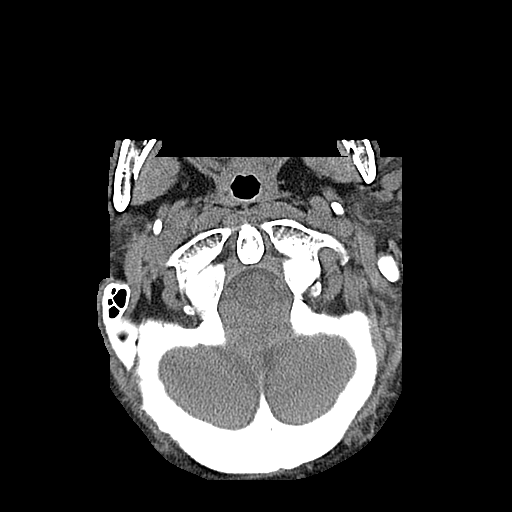
[im 73/88  bone]
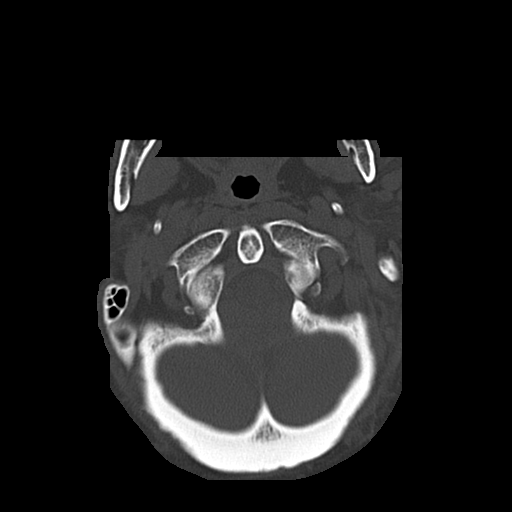

[13 of 20 positions shown; findings below may reference images not displayed]

FINDINGS: The foramen magnum is widely patent.  There is facet
degeneration bilaterally at C2-3 but no slippage or stenosis.

C3-4:  Normal interspace.

C4-5:  Normal interspace.

C5-6:  Normal interspace.

C6-7:  Normal interspace.

C7-T1:  There is mild facet degeneration but no slippage or
stenosis.  Canal and foramina appear sufficiently patent.
IMPRESSION: No distinct cause of the patient's described symptoms is
identified.  There is some facet degeneration at C2-3 without
slippage or apparent encroachment upon the neural spaces.

## 2011-05-27 MED ORDER — IOHEXOL 350 MG/ML SOLN
100.0000 mL | Freq: Once | INTRAVENOUS | Status: AC | PRN
  Administered 2011-05-27: 100 mL via INTRAVENOUS

## 2011-05-28 LAB — GLUCOSE, CAPILLARY
Glucose-Capillary: 161 mg/dL — ABNORMAL HIGH (ref 70–99)
Glucose-Capillary: 135 mg/dL — ABNORMAL HIGH (ref 70–99)
Glucose-Capillary: 161 mg/dL — ABNORMAL HIGH (ref 70–99)

## 2011-05-28 LAB — BASIC METABOLIC PANEL
BUN: 11 mg/dL (ref 6–23)
CO2: 32 mEq/L (ref 19–32)
Calcium: 8.7 mg/dL (ref 8.4–10.5)
Chloride: 99 mEq/L (ref 96–112)
Creatinine, Ser: 0.5 mg/dL (ref 0.50–1.10)
GFR calc Af Amer: >60 mL/min (ref >60)
GFR calc non Af Amer: >60 mL/min (ref >60)
Glucose, Bld: 146 mg/dL — ABNORMAL HIGH (ref 70–99)
Potassium: 3.4 mEq/L — ABNORMAL LOW (ref 3.5–5.1)
Sodium: 139 mEq/L (ref 135–145)

## 2011-05-28 LAB — MAGNESIUM
Magnesium: 1.3 mg/dL — ABNORMAL LOW (ref 1.5–2.5)
Magnesium: 1.5 mg/dL (ref 1.5–2.5)

## 2011-05-28 LAB — CBC
HCT: 32.1 % — ABNORMAL LOW (ref 36.0–46.0)
Hemoglobin: 11.3 g/dL — ABNORMAL LOW (ref 12.0–15.0)
MCH: 32.6 pg (ref 26.0–34.0)
MCHC: 35.2 g/dL (ref 30.0–36.0)
MCV: 92.5 fL (ref 78.0–100.0)
Platelets: 253 10*3/uL (ref 150–400)
RBC: 3.47 MIL/uL — ABNORMAL LOW (ref 3.87–5.11)
RDW: 12.8 % (ref 11.5–15.5)
WBC: 10.1 10*3/uL (ref 4.0–10.5)

## 2011-05-28 LAB — LIPID PANEL
Cholesterol: 142 mg/dL (ref 0–200)
HDL: 34 mg/dL — ABNORMAL LOW (ref >39)
LDL Cholesterol: UNDETERMINED mg/dL (ref 0–99)
Total CHOL/HDL Ratio: 4.2 RATIO
Triglycerides: 524 mg/dL — ABNORMAL HIGH (ref <150)
VLDL: UNDETERMINED mg/dL (ref 0–40)

## 2011-05-28 LAB — CARDIAC PANEL(CRET KIN+CKTOT+MB+TROPI)
CK, MB: 1.9 ng/mL (ref 0.3–4.0)
CK, MB: 1.9 ng/mL (ref 0.3–4.0)
Relative Index: INVALID (ref 0.0–2.5)
Relative Index: INVALID (ref 0.0–2.5)
Total CK: 36 U/L (ref 7–177)
Total CK: 37 U/L (ref 7–177)
Troponin I: <0.3 ng/mL (ref <0.30)
Troponin I: <0.3 ng/mL (ref <0.30)

## 2011-05-28 LAB — HEMOGLOBIN A1C
Hgb A1c MFr Bld: 7.1 % — ABNORMAL HIGH (ref <5.7)
Mean Plasma Glucose: 157 mg/dL — ABNORMAL HIGH (ref <117)

## 2011-05-28 LAB — TSH: TSH: 2.093 u[IU]/mL (ref 0.350–4.500)

## 2011-05-31 ENCOUNTER — Telehealth: Payer: Self-pay | Admitting: *Deleted

## 2011-05-31 NOTE — Telephone Encounter (Signed)
Left a message with results on patient's cell phone

## 2011-05-31 NOTE — Telephone Encounter (Signed)
Message copied by Daphine Deutscher on Mon May 31, 2011 12:35 PM ------      Message from: Meredith Pel      Created: Mon May 31, 2011 11:27 AM       Great, her WBC is back down to normal. Please let her know. I believe she has a follow up appt. Thanks

## 2011-06-01 ENCOUNTER — Other Ambulatory Visit: Payer: Self-pay | Admitting: Internal Medicine

## 2011-06-07 NOTE — Discharge Summary (Signed)
NAMEPAIGHTON, GODETTE NO.:  [id number]  MEDICAL RECORD NO.:  [id number]  LOCATION:  3743                         FACILITY:  [facility]  PHYSICIAN:  [place] Gavon Majano, MD         DATE OF BIRTH:  05/17/1961  DATE OF ADMISSION:  05/27/2011 DATE OF DISCHARGE:  05/28/2011                              DISCHARGE SUMMARY   DISCHARGE DIAGNOSES: 1. Chest pain cardiac enzymes negative x3.  BNP 76.1.  Negative     catheterization in 2011. 2. Possible pulmonary hypertension. 3. Hypertriglyceridemia. 4. Diabetes mellitus type 2. 5. Hypertension. 6. Obesity. 7. Hypokalemia, repleted.  HOSPITAL COURSE:  Ms. Greggs is a 50 year old Caucasian female who has a history of obesity, sarcoidosis, hypertension, diabetes mellitus, dyslipidemia, gastroesophageal reflux disease, hypertension, gout, depression, obstructive sleep apnea, and cholecystectomy.  She [facility] ED with complaints of chest pain, shortness of breath 2-3 days with pain radiating down her left arm.  She was admitted to rule out acute coronary syndrome.  Her home meds were continued.  She was started on nitroglycerin paste q. 6 hour, Lovenox.  Troponin was negative 4 times.  BNP 76.1.  Chest x-ray:  Lungs were clear.  No pleural effusion were identified.  There was no significant abnormality identified.  X-ray of the abdomen two view borderline prominent stool left colon.  Otherwise, normal bowel gas pattern.  Hernia mesh noted. CT angiogram of the chest.  No evidence of pulmonary embolism to the level of the subsegmental pulmonary arteries.  Interval apparent increase in cardiac size and caliber of the main pulmonary artery. Further evaluation performed with echocardiogram.  There was no discrete evidence of pulmonary sarcoidosis.  Currently, Ms. Lacerte is chest pain-free.  She had no events overnight.  She will be seen by Dr. Rennis Golden because she was stable for discharge home with outpatient  2-D echocardiogram and follow up with Dr. Clarene Duke.  He will also increase her atenolol to 100 mg daily.  DISCHARGE LABORATORY DATA:  WBCs 10.1, hemoglobin 11.3, hematocrit 32.1, platelets 253.  Sodium 139, potassium 3.4, chloride 99, carbon dioxide 32, glucose 146, BUN 11, creatinine 0.50, hemoglobin A1c 7.4, lipase 25. Total bilirubin 0.4, alkaline phosphatase 68, AST 20, ALT 20.  Total protein 7.1, albumin 4.0, calcium 8.7, and magnesium 1.5.  Total cholesterol 142, triglycerides 524, HDL 34, total cholesterol to HDL ratio was 4.2.  Thyroid stimulated hormone 2.093.  STUDIES/PROCEDURES:  See hospital course as detailed above.  DISCHARGE MEDICATIONS: 1. Atenolol 100 mg 1 tablet by mouth daily. 2. Aspirin 81 mg 1 tablet by mouth daily. 3. Nitroglycerin sublingual 0.4 mg 1 tablet every 5 minutes up to 3     tablets total for chest pain. 4. Benzonatate 100 mg 1 tablet by mouth 3 times a day for increased     cough. 5. Clonidine 0.1 mg 1 tablet by mouth daily. 6. Dexilant 60 mg 1 tablet by mouth every morning. 7. Effexor 75 mg 1 tablet by mouth every evening. 8. Estradiol 2 mg 1 tablet by mouth every evening. 9. Furosemide 20 mg 1 tablet by mouth every morning. 10.Humira 40 mg injection with pen, 1 pen subcutaneously every 2  weeks. 11.Lisinopril 5 mg 1 tablet by mouth every morning. 12.Lorazepam 1 mg 1 tablet by mouth twice daily as needed for spasm. 13.Metformin 500 mg 3 tablets by mouth twice daily. 14.Ciclesonide 50 mcg per nasal spray 2 sprays nasally daily as needed     for allergies. 15.Potassium chloride 20 mEq 1 tablet by mouth three times a day. 16.Prednisone 5 mg 1 tablet by mouth daily. 17.Reglan 10 mg 1 tablet by mouth four times daily. 18.Tussionex extended release oral suspension. 19.Hydrocodone/chlorpheniramine 10 mg/8 mg/5 mL 1 teaspoon by mouth     twice daily as needed for cough. 20.Vitamin D 1000 units over-the-counter 1 tablet by mouth every      morning. 21.Wellbutrin XL 1 tablet by mouth every morning up to 300 mg. 22.Allopurinol 100 mg 1 tablet by mouth every evening. 23.Lovaza 1 g capsules 1 capsule by mouth twice daily.  DISPOSITION:  Ms. Grunwald will be discharged home in stable condition. Actually, she has no restrictions.  Recommend she eats a heart-healthy diet low in carbohydrate.  She will follow up with [facility] for 2-D echocardiogram on Tuesday, June 01, 2011, at 3 p.m. and then with Dr. Clarene Duke thereafter, Monday June 07, 2011, at 4 p.m.    ______________________________ Wilburt Finlay, PA   ______________________________ [place] Loray Akard, MD    BH/MEDQ  D:  05/28/2011  T:  05/28/2011  Job:  [id number]  cc:   Dr. Clarene Duke  Electronically Signed by Wilburt Finlay PA on 06/07/2011 01:18:50 PM Electronically Signed by Kirtland Bouchard. Hady Niemczyk M.D. on 06/07/2011 32:44:01 PM

## 2011-06-13 NOTE — Discharge Summary (Signed)
  NAMESHAMYA, MACFADDEN              ACCOUNT NO.:  [id number]  MEDICAL RECORD NO.:  [id number]  LOCATION:  3743                         FACILITY:  [facility]  PHYSICIAN:  Thereasa Solo. Varetta Chavers, M.D. DATE OF BIRTH:  26-Aug-1961  DATE OF ADMISSION:  05/27/2011 DATE OF DISCHARGE:  05/28/2011                              DISCHARGE SUMMARY   ADDENDUM  Correction to medical reconciliation, the patient will not be started on Lovaza, but will be started on TriCor 145 mg daily for elevated triglycerides.    ______________________________ Wilburt Finlay, PA   ______________________________ Thereasa Solo. Vanna Sailer, M.D.    Jane Canary  D:  05/28/2011  T:  05/28/2011  Job:  [id number]  Electronically Signed by Wilburt Finlay PA on 06/07/2011 01:19:28 PM Electronically Signed by Julieanne Manson M.D. on 06/13/2011 02:29:02 PM

## 2011-06-15 ENCOUNTER — Ambulatory Visit: Payer: BC Managed Care – PPO | Admitting: Internal Medicine

## 2011-06-16 ENCOUNTER — Ambulatory Visit (INDEPENDENT_AMBULATORY_CARE_PROVIDER_SITE_OTHER): Payer: BC Managed Care – PPO | Admitting: Internal Medicine

## 2011-06-16 ENCOUNTER — Encounter: Payer: Self-pay | Admitting: Internal Medicine

## 2011-06-16 DIAGNOSIS — D649 Anemia, unspecified: Secondary | ICD-10-CM

## 2011-06-16 DIAGNOSIS — J99 Respiratory disorders in diseases classified elsewhere: Secondary | ICD-10-CM

## 2011-06-16 DIAGNOSIS — D869 Sarcoidosis, unspecified: Secondary | ICD-10-CM

## 2011-06-16 DIAGNOSIS — I10 Essential (primary) hypertension: Secondary | ICD-10-CM

## 2011-06-16 DIAGNOSIS — D860 Sarcoidosis of lung: Secondary | ICD-10-CM

## 2011-06-16 DIAGNOSIS — E785 Hyperlipidemia, unspecified: Secondary | ICD-10-CM

## 2011-06-16 DIAGNOSIS — E669 Obesity, unspecified: Secondary | ICD-10-CM

## 2011-06-16 NOTE — Assessment & Plan Note (Signed)
Has resolved . Last hgb of 11.3 likely related to iv fluids and frequent lab draws I'll remove problem from list

## 2011-06-16 NOTE — Assessment & Plan Note (Signed)
Wt Readings from Last 3 Encounters:  06/16/11 252 lb (114.306 kg)  05/19/11 249 lb (112.946 kg)  04/29/11 250 lb (113.399 kg)   She understands need for weight loss

## 2011-06-16 NOTE — Assessment & Plan Note (Signed)
Started on fenofibrate Has f/u with dr. Clarene Duke

## 2011-06-16 NOTE — Progress Notes (Signed)
  Subjective:    Patient ID: [name], female    DOB: 12/22/1960, 50 y.o.   MRN: [id number]  HPI   patient comes in for followup of multiple medical problems including type 2 diabetes, hyperlipidemia, hypertension. The patient does not check blood sugar or blood pressure at home. The patetient does not follow an exercise or diet program. The patient denies any polyuria, polydipsia.  In the past the patient has gone to diabetic treatment center. The patient is tolerating medications  Without difficulty. The patient does admit to medication compliance.   She has been on a slow steroid taper prescribed by dr. Ephriam Knuckles hospital record. Note hospital d/c summary states prednisone 5mg  daily.   Past Medical History  Diagnosis Date  . Sarcoidosis 04/10/2007  . DIABETES MELLITUS, TYPE II 08/21/2006  . HYPERLIPIDEMIA 08/21/2006  . GOUT 08/20/2010  . OBESITY 06/04/2009  . ANEMIA-UNSPECIFIED 09/18/2009  . HYPERTENSION 08/21/2006  . GERD 08/21/2006  . SLEEP APNEA 04/21/2009   Past Surgical History  Procedure Date  . Ventral hernia repair   . Nissen fundoplication   . Cholecystectomy   . Abdominal hysterectomy   . Knee arthroscopy     right    reports that she has never smoked. She has never used smokeless tobacco. She reports that she does not drink alcohol or use illicit drugs. family history includes Asthma in her mother; COPD in her mother; Diabetes in her father; Emphysema in her mother; Heart attack in her father; and Sarcoidosis in her maternal uncle.  There is no history of Colon cancer. Allergies  Allergen Reactions  . Methotrexate     REACTION: peri-oral and buccal lesions.     Review of Systems  patient denies chest pain, shortness of breath, orthopnea. Denies lower extremity edema, abdominal pain, change in appetite, change in bowel movements. Patient denies rashes, musculoskeletal complaints. No other specific complaints in a complete review of systems.        Objective:   Physical Exam  Well-developed well-nourished female in no acute distress. HEENT exam atraumatic, normocephalic, extraocular muscles are intact. Neck is supple. No jugular venous distention no thyromegaly. Chest clear to auscultation without increased work of breathing. Cardiac exam S1 and S2 are regular. Abdominal exam active bowel sounds, soft, nontender. Extremities no edema. Neurologic exam she is alert without any motor sensory deficits. Gait is normal.        Assessment & Plan:

## 2011-06-16 NOTE — Assessment & Plan Note (Signed)
BP: 104/70 mmHg  bp controlled Continue same meds. Reviewed recent bmet

## 2011-06-16 NOTE — Assessment & Plan Note (Signed)
Followed by dr Delford Field and rheumatology Off of remicade and on humira

## 2011-06-28 ENCOUNTER — Telehealth: Payer: Self-pay | Admitting: Internal Medicine

## 2011-06-28 ENCOUNTER — Other Ambulatory Visit: Payer: BC Managed Care – PPO | Admitting: Internal Medicine

## 2011-06-29 ENCOUNTER — Telehealth: Payer: Self-pay | Admitting: *Deleted

## 2011-06-29 NOTE — Telephone Encounter (Signed)
Called pt to do follow up call and pt stated that her procedure yesterday was cancelled due to not time yet for recall. EWM

## 2011-07-02 ENCOUNTER — Ambulatory Visit: Payer: BC Managed Care – PPO | Admitting: Critical Care Medicine

## 2011-07-07 ENCOUNTER — Encounter: Payer: Self-pay | Admitting: Critical Care Medicine

## 2011-07-07 ENCOUNTER — Ambulatory Visit (INDEPENDENT_AMBULATORY_CARE_PROVIDER_SITE_OTHER): Payer: BC Managed Care – PPO | Admitting: Critical Care Medicine

## 2011-07-07 VITALS — BP 124/68 | HR 62 | Temp 98.1°F | Ht 66.0 in | Wt 254.2 lb

## 2011-07-07 DIAGNOSIS — D860 Sarcoidosis of lung: Secondary | ICD-10-CM

## 2011-07-07 DIAGNOSIS — Z23 Encounter for immunization: Secondary | ICD-10-CM

## 2011-07-07 DIAGNOSIS — J99 Respiratory disorders in diseases classified elsewhere: Secondary | ICD-10-CM

## 2011-07-07 DIAGNOSIS — D869 Sarcoidosis, unspecified: Secondary | ICD-10-CM

## 2011-07-07 NOTE — Assessment & Plan Note (Signed)
Pulmonary sarcoidosis with associated hilar reflux disease and associated cyclical cough The cyclical cough may be exacerbated by underlying ACE inhibitor use Plan Consider discontinuance of ACE inhibitor and  switch to an ARB Continue Humira per  rheumatology

## 2011-07-07 NOTE — Patient Instructions (Signed)
No change in medications. Return in         4 months Flu vaccine today I will discuss with Dr Clarene Duke about your lisinopril medication

## 2011-07-07 NOTE — Progress Notes (Signed)
Subjective:    Patient ID: [name], female    DOB: [birth year], 50 y.o.   MRN: [id number]  HPI 50 y.o. WF with Sarcoid, GERD, VCD, cyclical cough   07/07/2011 Doing well ,  Min cough,  Dyspnea ok,  Some pndrip noted.  Occ QHS gasping for breath Pt denies any significant sore throat, nasal congestion or excess secretions, fever, chills, sweats, unintended weight loss, pleurtic or exertional chest pain, orthopnea PND, or leg swelling Pt denies any increase in rescue therapy over baseline, denies waking up needing it or having any early am or nocturnal exacerbations of coughing/wheezing/or dyspnea. Pt also denies any obvious fluctuation in symptoms with  weather or environmental change or other alleviating or aggravating factors   Review of Systems  Constitutional: Negative for fever and unexpected weight change.  HENT: Negative for ear pain, nosebleeds, congestion, sore throat, rhinorrhea, sneezing, trouble swallowing, dental problem, postnasal drip and sinus pressure.   Eyes: Negative for redness and itching.  Respiratory: Positive for cough. Negative for chest tightness, shortness of breath and wheezing.        Cough is improved   Cardiovascular: Negative for palpitations and leg swelling.  Gastrointestinal: Negative for nausea and vomiting.  Genitourinary: Negative for dysuria.  Musculoskeletal: Negative for joint swelling.  Skin: Negative for rash.  Neurological: Negative for headaches.  Hematological: Does not bruise/bleed easily.  Psychiatric/Behavioral: Negative for dysphoric mood. The patient is not nervous/anxious.        Objective:   Physical Exam  Obese female in nad [place] Vitals:   07/07/11 1136  BP: 124/68  Pulse: 62  Temp: 98.1 F (36.7 C)  TempSrc: Oral  Height: 5\' 6"  (1.676 m)  Weight: 254 lb 3.2 oz (115.304 kg)  SpO2: 99%    Gen: Pleasant, obese  in no distress,  Flat  affect  ENT: No lesions,  mouth clear,  oropharynx clear, no postnasal  drip  Neck: No JVD, no TMG, no carotid bruits  Lungs: No use of accessory muscles, no dullness to percussion, clear without rales or rhonchi, mild pseudowheeze  Cardiovascular: RRR, heart sounds normal, no murmur or gallops, no peripheral edema  Abdomen: soft and NT, no HSM,  BS normal  Musculoskeletal: No deformities, no cyanosis or clubbing  Neuro: alert, non focal  Skin: Warm, no lesions or rashes    Assessment & Plan:   Sarcoidosis of lung Pulmonary sarcoidosis with associated hilar reflux disease and associated cyclical cough The cyclical cough may be exacerbated by underlying ACE inhibitor use Plan Consider discontinuance of ACE inhibitor and  switch to an ARB Continue Humira per  rheumatology     Updated Medication List Outpatient Encounter Prescriptions as of 07/07/2011  Medication Sig Dispense Refill  . Adalimumab (HUMIRA PEN [place]) Injection every 2 weeks.       [name] allopurinol (ZYLOPRIM) 100 MG tablet Take 100 mg by mouth daily.       [name] aspirin 81 MG tablet Take 81 mg by mouth daily.        [name] atenolol (TENORMIN) 50 MG tablet Take 100 mg by mouth daily.        . benzonatate (TESSALON) 100 MG capsule Take 100 mg by mouth 3 (three) times daily as needed.        [name] buPROPion (WELLBUTRIN XL) 300 MG 24 hr tablet TAKE ONE TABLET BY MOUTH ONE TIME DAILY  30 tablet  3  . chlorpheniramine-HYDROcodone (TUSSIONEX) 10-8 MG/5ML LQCR Take 5 mLs by mouth every 12 (twelve)  hours as needed.        . Cholecalciferol (GNP VITAMIN D) 1000 UNITS tablet Take 1,000 Units by mouth daily.        . ciclesonide (OMNARIS) 50 MCG/ACT nasal spray 2 sprays by Each Nare route daily.        . cloNIDine (CATAPRES) 0.1 MG tablet Take 0.1 mg by mouth daily.       [name] COLCRYS 0.6 MG tablet Take 1 tablet by mouth Daily.      [name] DEXILANT 60 MG capsule TAKE ONE CAPSULE BY MOUTH ONE TIME DAILY  30 capsule  11  . estradiol (ESTRACE) 1 MG tablet Take 2 mg by mouth daily.       . fenofibrate (TRICOR) 145 MG tablet  Take 145 mg by mouth daily.        . furosemide (LASIX) 20 MG tablet TAKE ONE TABLET BY MOUTH ONE TIME DAILY  30 tablet  2  . glucose blood (FREESTYLE LITE) test strip Use as instructed  100 each  12  . HYDROcodone-acetaminophen (NORCO) 5-325 MG per tablet Take 2 tablets by mouth every 6 (six) hours as needed for pain.  20 tablet  0  . lisinopril (PRINIVIL,ZESTRIL) 5 MG tablet Take 1 tablet (5 mg total) by mouth daily.  30 tablet  5  . LORazepam (ATIVAN) 1 MG tablet Take 1 tablet (1 mg total) by mouth 2 (two) times daily as needed.  30 tablet  0  . metFORMIN (GLUCOPHAGE) 500 MG tablet Take 1 tablet (500 mg total) by mouth 2 (two) times daily with a meal.  60 tablet  5  . metoCLOPramide (REGLAN) 10 MG tablet 1 tablet 30 minutes before meals and at bedtime  50 tablet  0  . nitroGLYCERIN (NITROSTAT) 0.4 MG SL tablet Place 0.4 mg under the tongue every 5 (five) minutes as needed.        . nystatin-triamcinolone (MYCOLOG II) cream Apply 100,000 g topically 2 (two) times daily as needed.        [name] PATADAY 0.2 % SOLN Place 1 drop into both eyes Daily.      . potassium chloride SA (K-DUR,KLOR-CON) 20 MEQ tablet TAKE ONE TABLET BY MOUTH THREE TIMES DAILY  90 tablet  1  . predniSONE (DELTASONE) 5 MG tablet Take 5 mg by mouth daily.       [name] venlafaxine (EFFEXOR) 75 MG tablet Take 75 mg by mouth 2 (two) times daily.        [name] DISCONTD: chlorpheniramine-HYDROcodone (TUSSIONEX) 10-8 MG/5ML LQCR Take 5 mLs by mouth every 12 (twelve) hours.  140 mL  1  . DISCONTD: predniSONE (DELTASONE) 10 MG tablet 4, 2, 1 daily each for 4 days and then resume usual dose of 5 mg daily  30 tablet  0  . DISCONTD: predniSONE (DELTASONE) 10 MG tablet Take 5 mg by mouth daily.        [name] DISCONTD: cycloSPORINE (RESTASIS) 0.05 % ophthalmic emulsion Place 1 drop into both eyes daily.        [name] DISCONTD: hydrocortisone (ANUSOL-HC) 25 MG suppository Use 1 suppository at bedtime for 7 nights  7 suppository  1  . DISCONTD: loteprednol (LOTEMAX)  0.5 % ophthalmic suspension Place 1 drop into both eyes every 6 (six) hours.        [name] DISCONTD: peg 3350 powder (MOVIPREP) 100 G SOLR Moviprep Take as directed.  1 kit  0

## 2011-07-10 IMAGING — CR DG FOOT COMPLETE 3+V*R*
3 series · 3 of 3 positions shown · non-contrast
Comparison: 09/20/2009

CLINICAL DATA: Pain, no known trauma

RIGHT FOOT COMPLETE - 3+ VIEW

[t foot ap right]
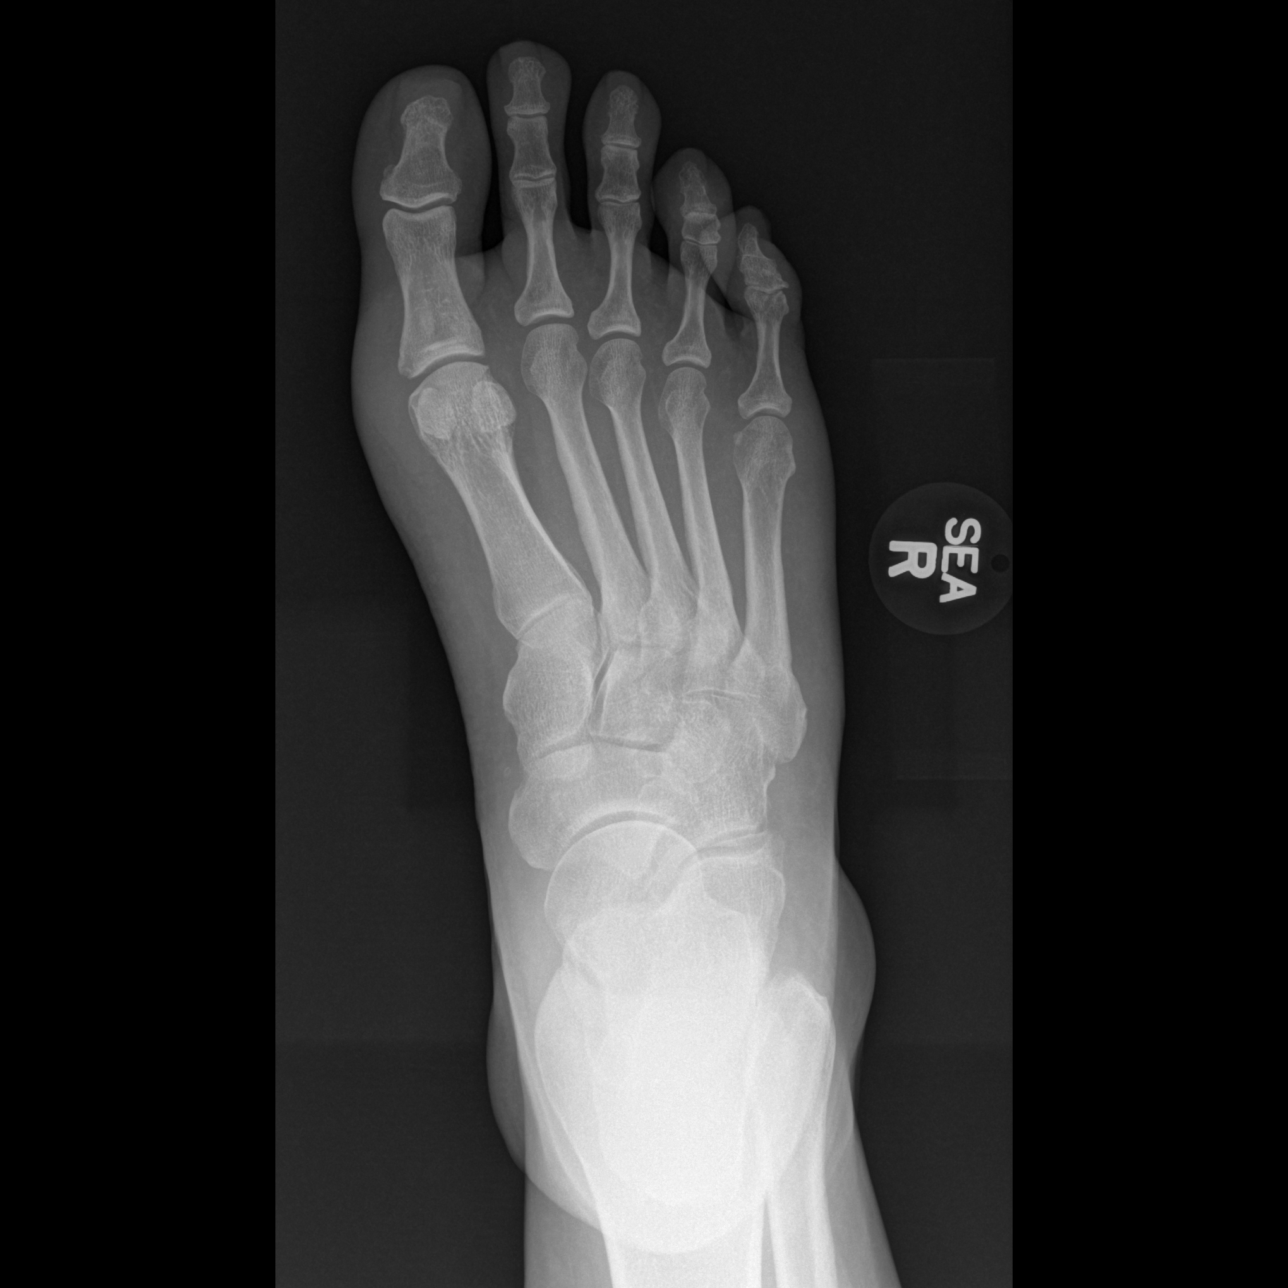

[t foot oblique right]
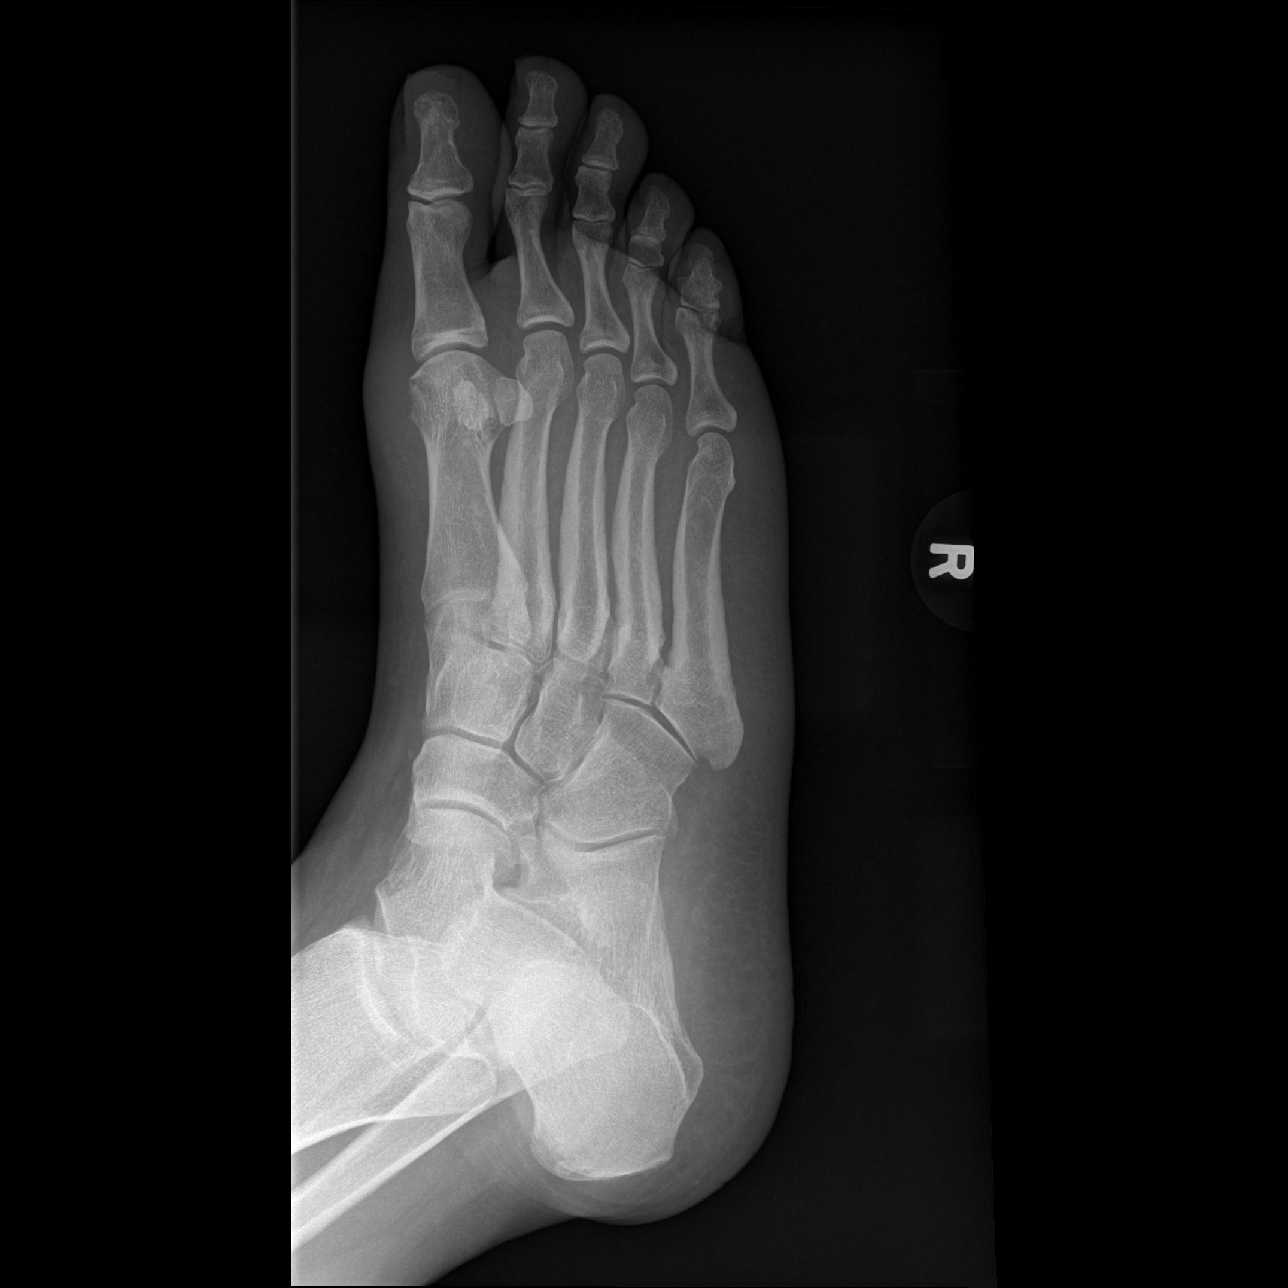

[t foot lat right]
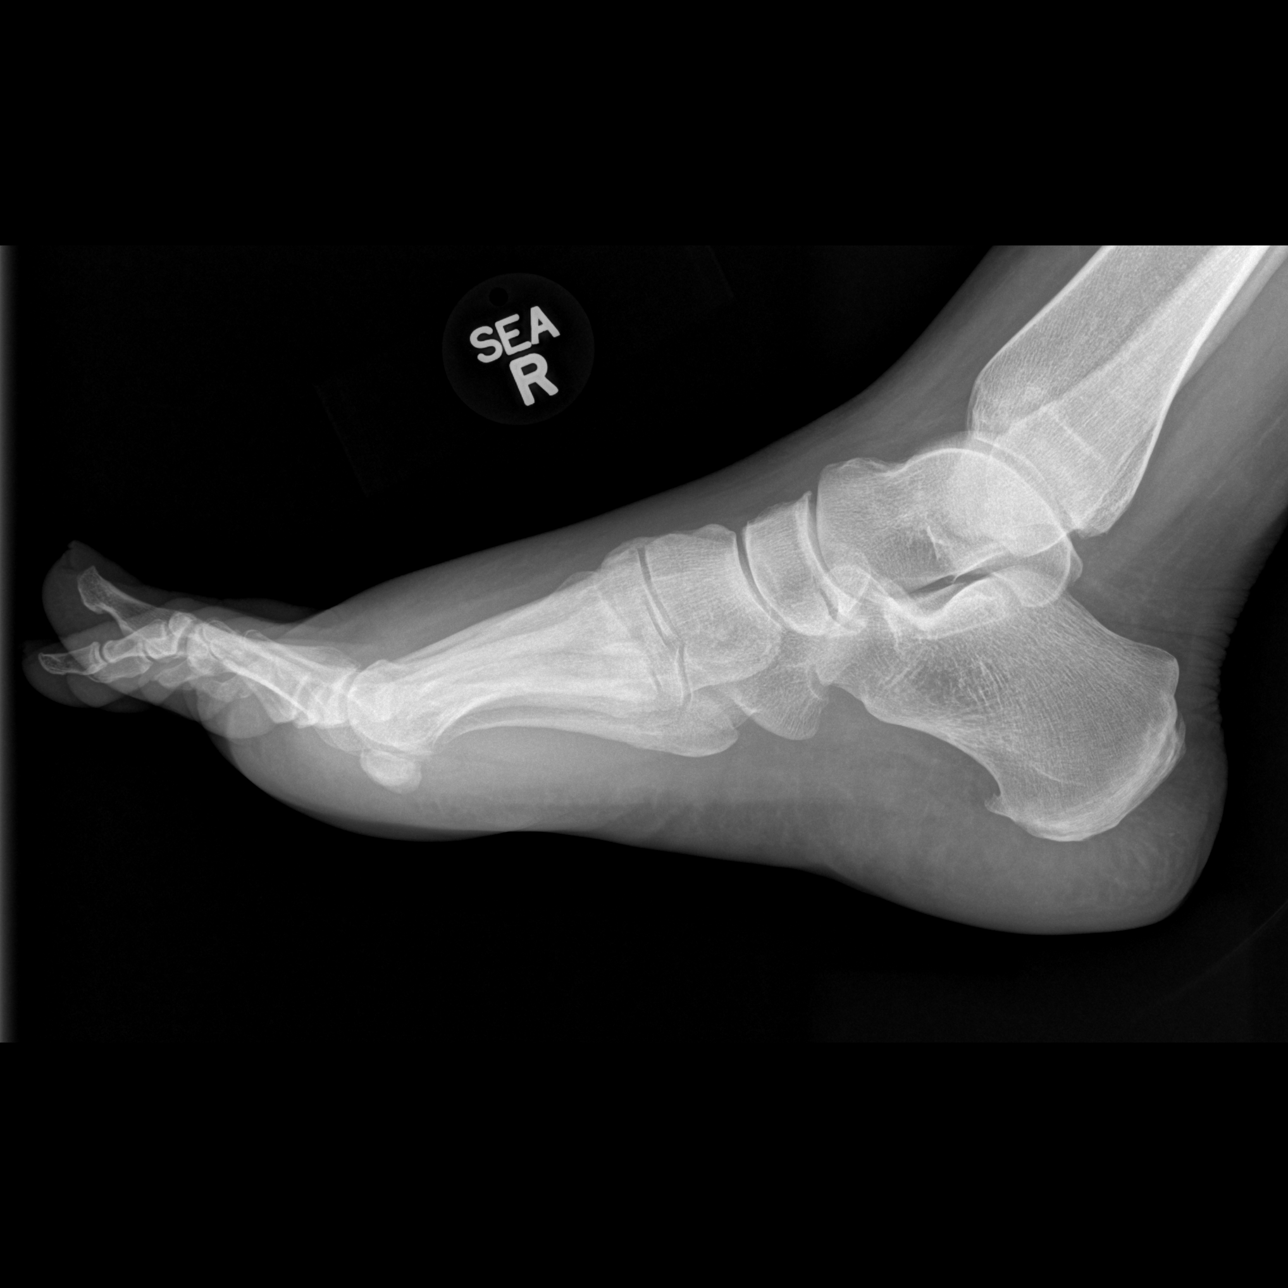

[3 of 3 positions shown; findings below may reference images not displayed]

FINDINGS: Three views of the right foot submitted.  No acute
fracture or subluxation.  Plantar spur of the calcaneus is noted.
IMPRESSION: No acute fracture or subluxation.  Plantar spur of the calcaneus is
noted.

## 2011-07-13 ENCOUNTER — Encounter: Payer: Self-pay | Admitting: Internal Medicine

## 2011-07-13 ENCOUNTER — Ambulatory Visit (INDEPENDENT_AMBULATORY_CARE_PROVIDER_SITE_OTHER): Payer: BC Managed Care – PPO | Admitting: Internal Medicine

## 2011-07-13 DIAGNOSIS — D860 Sarcoidosis of lung: Secondary | ICD-10-CM

## 2011-07-13 DIAGNOSIS — D869 Sarcoidosis, unspecified: Secondary | ICD-10-CM

## 2011-07-13 DIAGNOSIS — I10 Essential (primary) hypertension: Secondary | ICD-10-CM

## 2011-07-13 DIAGNOSIS — E119 Type 2 diabetes mellitus without complications: Secondary | ICD-10-CM

## 2011-07-13 DIAGNOSIS — J99 Respiratory disorders in diseases classified elsewhere: Secondary | ICD-10-CM

## 2011-07-13 MED ORDER — HYDROCOD POLST-CHLORPHEN POLST 10-8 MG/5ML PO LQCR: 5.0000 mL | Freq: Two times a day (BID) | ORAL | Status: DC | PRN

## 2011-07-13 MED ORDER — BENZONATATE 100 MG PO CAPS: 100.0000 mg | ORAL_CAPSULE | Freq: Three times a day (TID) | ORAL | Status: DC | PRN

## 2011-07-13 NOTE — Patient Instructions (Signed)
Get plenty of rest, Drink lots of  clear liquids, and use Tylenol or ibuprofen for fever and discomfort.    Call or return to clinic prn if these symptoms worsen or fail to improve as anticipated.  nasonex use daily

## 2011-07-13 NOTE — Progress Notes (Signed)
  Subjective:    Patient ID: [name], female    DOB: November 05, 1960, 50 y.o.   MRN: [id number]  HPI  50 year old patient who has treated hypertension and diabetes. She is also followed by pulmonary medicine due to sarcoidosis of her lungs. For the past 2 days she's had increasing cough and chest congestion cough is quite bothersome but nonproductive no fever chills shortness of breath or wheezing.    Review of Systems  Constitutional: Positive for fatigue. Negative for fever and chills.  HENT: Positive for congestion and rhinorrhea. Negative for hearing loss, sore throat, dental problem, sinus pressure and tinnitus.   Eyes: Negative for pain, discharge and visual disturbance.  Respiratory: Positive for cough. Negative for shortness of breath.   Cardiovascular: Negative for chest pain, palpitations and leg swelling.  Gastrointestinal: Negative for nausea, vomiting, abdominal pain, diarrhea, constipation, blood in stool and abdominal distention.  Genitourinary: Negative for dysuria, urgency, frequency, hematuria, flank pain, vaginal bleeding, vaginal discharge, difficulty urinating, vaginal pain and pelvic pain.  Musculoskeletal: Negative for joint swelling, arthralgias and gait problem.  Skin: Negative for rash.  Neurological: Negative for dizziness, syncope, speech difficulty, weakness, numbness and headaches.  Hematological: Negative for adenopathy.  Psychiatric/Behavioral: Negative for behavioral problems, dysphoric mood and agitation. The patient is not nervous/anxious.        Objective:   Physical Exam  Constitutional: She is oriented to person, place, and time. She appears well-developed and well-nourished. No distress.       Obese with frequent paroxysms of coughing. Blood pressure 112/80  HENT:  Head: Normocephalic.  Right Ear: External ear normal.  Left Ear: External ear normal.  Mouth/Throat: Oropharynx is clear and moist.  Eyes: Conjunctivae and EOM are normal. Pupils  are equal, round, and reactive to light.  Neck: Normal range of motion. Neck supple. No thyromegaly present.  Cardiovascular: Normal rate, regular rhythm, normal heart sounds and intact distal pulses.   Pulmonary/Chest: Effort normal and breath sounds normal. No respiratory distress. She has no wheezes. She has no rales.       Oxygen saturation 98% Frequent paroxysms of coughing  Abdominal: Soft. Bowel sounds are normal. She exhibits no mass. There is no tenderness.  Musculoskeletal: Normal range of motion.  Lymphadenopathy:    She has no cervical adenopathy.  Neurological: She is alert and oriented to person, place, and time.  Skin: Skin is warm and dry. No rash noted.  Psychiatric: She has a normal mood and affect. Her behavior is normal.          Assessment & Plan:   Viral URI. Pulmonary sarcoidosis  We'll treat symptomatically we'll refill her Tussionex as well as her Tessalon. We'll also give samples of a nasal steroid. She will call as she fails to improve

## 2011-07-23 ENCOUNTER — Other Ambulatory Visit: Payer: Self-pay | Admitting: Internal Medicine

## 2011-08-04 ENCOUNTER — Other Ambulatory Visit: Payer: Self-pay | Admitting: Internal Medicine

## 2011-08-31 ENCOUNTER — Other Ambulatory Visit: Payer: Self-pay | Admitting: Obstetrics and Gynecology

## 2011-09-07 ENCOUNTER — Encounter: Payer: Self-pay | Admitting: Family Medicine

## 2011-09-07 ENCOUNTER — Ambulatory Visit (INDEPENDENT_AMBULATORY_CARE_PROVIDER_SITE_OTHER): Payer: BC Managed Care – PPO | Admitting: Family Medicine

## 2011-09-07 VITALS — BP 128/80 | HR 78 | Temp 98.4°F | Wt 262.0 lb

## 2011-09-07 DIAGNOSIS — R079 Chest pain, unspecified: Secondary | ICD-10-CM

## 2011-09-07 DIAGNOSIS — M940 Chondrocostal junction syndrome [Tietze]: Secondary | ICD-10-CM

## 2011-09-07 DIAGNOSIS — J329 Chronic sinusitis, unspecified: Secondary | ICD-10-CM

## 2011-09-07 MED ORDER — HYDROCOD POLST-CHLORPHEN POLST 10-8 MG/5ML PO LQCR: 5.0000 mL | Freq: Two times a day (BID) | ORAL | Status: DC | PRN

## 2011-09-07 MED ORDER — AZITHROMYCIN 250 MG PO TABS: ORAL_TABLET | ORAL | Status: DC

## 2011-09-07 NOTE — Progress Notes (Signed)
  Subjective:    Patient ID: [name], female    DOB: 1961-05-19, 50 y.o.   MRN: [id number]  HPI Here for 3 days of sinus pressure, PND, ST, and coughing up green sputum. No fevers. Also for 2 days she has had sharp chest pains centered around the sternum which may radiate around to the back. She has been mildly SOB. She has known pulmonary sarcoidosis. On Tylenol.    Review of Systems  Constitutional: Negative.   HENT: Positive for congestion, postnasal drip and sinus pressure.   Eyes: Negative.   Respiratory: Positive for cough and shortness of breath. Negative for wheezing.   Cardiovascular: Positive for chest pain. Negative for palpitations and leg swelling.       Objective:   Physical Exam  Constitutional: She appears well-developed and well-nourished. No distress.  HENT:  Right Ear: External ear normal.  Left Ear: External ear normal.  Nose: Nose normal.  Mouth/Throat: Oropharynx is clear and moist. No oropharyngeal exudate.  Eyes: Conjunctivae are normal.  Neck: No thyromegaly present.  Cardiovascular: Normal rate, regular rhythm and intact distal pulses.  Exam reveals no gallop and no friction rub.   No murmur heard.      EKG normal   Pulmonary/Chest: Effort normal. No respiratory distress. She has no wheezes. She has no rales.       Scattered dry crackles and rhonchi. She is tender over both sternal margins   Lymphadenopathy:    She has no cervical adenopathy.          Assessment & Plan:  This is sinusitis with some costochondritis from coughing. Stay on Tylenol. Treat with a Zpack. Use cough meds

## 2011-09-09 ENCOUNTER — Emergency Department (HOSPITAL_COMMUNITY): Payer: BC Managed Care – PPO

## 2011-09-09 ENCOUNTER — Inpatient Hospital Stay (HOSPITAL_COMMUNITY)
Admission: EM | Admit: 2011-09-09 | Discharge: 2011-09-12 | DRG: 143 | Disposition: A | Discharge status: Home / Self Care | Payer: BC Managed Care – PPO | Attending: Internal Medicine | Admitting: Internal Medicine

## 2011-09-09 ENCOUNTER — Encounter (HOSPITAL_COMMUNITY): Payer: Self-pay | Admitting: *Deleted

## 2011-09-09 DIAGNOSIS — R0602 Shortness of breath: Secondary | ICD-10-CM | POA: Diagnosis present

## 2011-09-09 DIAGNOSIS — D869 Sarcoidosis, unspecified: Secondary | ICD-10-CM | POA: Diagnosis present

## 2011-09-09 DIAGNOSIS — E1159 Type 2 diabetes mellitus with other circulatory complications: Secondary | ICD-10-CM | POA: Diagnosis present

## 2011-09-09 DIAGNOSIS — E1129 Type 2 diabetes mellitus with other diabetic kidney complication: Secondary | ICD-10-CM | POA: Diagnosis present

## 2011-09-09 DIAGNOSIS — Z6841 Body Mass Index (BMI) 40.0 and over, adult: Secondary | ICD-10-CM

## 2011-09-09 DIAGNOSIS — R0789 Other chest pain: Principal | ICD-10-CM | POA: Diagnosis present

## 2011-09-09 DIAGNOSIS — E1169 Type 2 diabetes mellitus with other specified complication: Secondary | ICD-10-CM | POA: Diagnosis present

## 2011-09-09 DIAGNOSIS — K219 Gastro-esophageal reflux disease without esophagitis: Secondary | ICD-10-CM | POA: Diagnosis present

## 2011-09-09 DIAGNOSIS — M109 Gout, unspecified: Secondary | ICD-10-CM | POA: Diagnosis present

## 2011-09-09 DIAGNOSIS — I152 Hypertension secondary to endocrine disorders: Secondary | ICD-10-CM | POA: Diagnosis present

## 2011-09-09 DIAGNOSIS — D860 Sarcoidosis of lung: Secondary | ICD-10-CM | POA: Diagnosis present

## 2011-09-09 DIAGNOSIS — J019 Acute sinusitis, unspecified: Secondary | ICD-10-CM | POA: Diagnosis present

## 2011-09-09 DIAGNOSIS — Z833 Family history of diabetes mellitus: Secondary | ICD-10-CM

## 2011-09-09 DIAGNOSIS — E119 Type 2 diabetes mellitus without complications: Secondary | ICD-10-CM | POA: Diagnosis present

## 2011-09-09 DIAGNOSIS — E785 Hyperlipidemia, unspecified: Secondary | ICD-10-CM | POA: Diagnosis present

## 2011-09-09 DIAGNOSIS — G4730 Sleep apnea, unspecified: Secondary | ICD-10-CM | POA: Diagnosis present

## 2011-09-09 DIAGNOSIS — I10 Essential (primary) hypertension: Secondary | ICD-10-CM | POA: Diagnosis present

## 2011-09-09 LAB — DIFFERENTIAL
Basophils Absolute: 0.1 10*3/uL (ref 0.0–0.1)
Basophils Relative: 1 % (ref 0–1)
Eosinophils Absolute: 0.3 10*3/uL (ref 0.0–0.7)
Eosinophils Relative: 2 % (ref 0–5)
Lymphocytes Relative: 28 % (ref 12–46)
Lymphs Abs: 3.2 10*3/uL (ref 0.7–4.0)
Monocytes Absolute: 0.7 10*3/uL (ref 0.1–1.0)
Monocytes Relative: 6 % (ref 3–12)
Neutro Abs: 7.4 10*3/uL (ref 1.7–7.7)
Neutrophils Relative %: 64 % (ref 43–77)

## 2011-09-09 LAB — BASIC METABOLIC PANEL
BUN: 11 mg/dL (ref 6–23)
CO2: 27 mEq/L (ref 19–32)
Calcium: 9.3 mg/dL (ref 8.4–10.5)
Chloride: 99 mEq/L (ref 96–112)
Creatinine, Ser: 0.55 mg/dL (ref 0.50–1.10)
GFR calc Af Amer: >90 mL/min (ref >90)
GFR calc non Af Amer: >90 mL/min (ref >90)
Glucose, Bld: 160 mg/dL — ABNORMAL HIGH (ref 70–99)
Potassium: 3.9 mEq/L (ref 3.5–5.1)
Sodium: 138 mEq/L (ref 135–145)

## 2011-09-09 LAB — CBC
HCT: 34.9 % — ABNORMAL LOW (ref 36.0–46.0)
Hemoglobin: 11.7 g/dL — ABNORMAL LOW (ref 12.0–15.0)
MCH: 31 pg (ref 26.0–34.0)
MCHC: 33.5 g/dL (ref 30.0–36.0)
MCV: 92.3 fL (ref 78.0–100.0)
Platelets: 285 10*3/uL (ref 150–400)
RBC: 3.78 MIL/uL — ABNORMAL LOW (ref 3.87–5.11)
RDW: 12.5 % (ref 11.5–15.5)
WBC: 11.7 10*3/uL — ABNORMAL HIGH (ref 4.0–10.5)

## 2011-09-09 LAB — CARDIAC PANEL(CRET KIN+CKTOT+MB+TROPI)
CK, MB: 2.1 ng/mL (ref 0.3–4.0)
Relative Index: INVALID (ref 0.0–2.5)
Total CK: 30 U/L (ref 7–177)
Troponin I: <0.3 ng/mL (ref <0.30)

## 2011-09-09 LAB — GLUCOSE, CAPILLARY
Glucose-Capillary: 168 mg/dL — ABNORMAL HIGH (ref 70–99)
Glucose-Capillary: 207 mg/dL — ABNORMAL HIGH (ref 70–99)

## 2011-09-09 LAB — PROTIME-INR
INR: 0.99 (ref 0.00–1.49)
Prothrombin Time: 13.3 seconds (ref 11.6–15.2)

## 2011-09-09 LAB — APTT: aPTT: 28 seconds (ref 24–37)

## 2011-09-09 LAB — LIPASE, BLOOD: Lipase: 18 U/L (ref 11–59)

## 2011-09-09 LAB — D-DIMER, QUANTITATIVE: D-Dimer, Quant: 0.27 ug/mL-FEU (ref 0.00–0.48)

## 2011-09-09 MED ORDER — HYDROMORPHONE HCL PF 1 MG/ML IJ SOLN
INTRAMUSCULAR | Status: AC
  Administered 2011-09-09: 1 mg
  Filled 2011-09-09: qty 1

## 2011-09-09 MED ORDER — POLYETHYLENE GLYCOL 3350 17 G PO PACK
17.0000 g | PACK | Freq: Every day | ORAL | Status: DC | PRN
  Filled 2011-09-09: qty 1

## 2011-09-09 MED ORDER — LOSARTAN POTASSIUM 50 MG PO TABS
50.0000 mg | ORAL_TABLET | Freq: Every day | ORAL | Status: DC
  Administered 2011-09-09 – 2011-09-12 (×4): 50 mg via ORAL
  Filled 2011-09-09 (×4): qty 1

## 2011-09-09 MED ORDER — ATENOLOL 50 MG PO TABS
50.0000 mg | ORAL_TABLET | Freq: Every day | ORAL | Status: DC
  Administered 2011-09-09 – 2011-09-12 (×4): 50 mg via ORAL
  Filled 2011-09-09 (×4): qty 1

## 2011-09-09 MED ORDER — ONDANSETRON HCL 4 MG PO TABS: 4.0000 mg | ORAL_TABLET | Freq: Four times a day (QID) | ORAL | Status: DC | PRN

## 2011-09-09 MED ORDER — LORAZEPAM 1 MG PO TABS: 1.0000 mg | ORAL_TABLET | Freq: Two times a day (BID) | ORAL | Status: DC | PRN

## 2011-09-09 MED ORDER — HEPARIN BOLUS VIA INFUSION
4000.0000 [IU] | Freq: Once | INTRAVENOUS | Status: AC
  Administered 2011-09-10: 4000 [IU] via INTRAVENOUS
  Filled 2011-09-09: qty 4000

## 2011-09-09 MED ORDER — ALLOPURINOL 100 MG PO TABS
100.0000 mg | ORAL_TABLET | Freq: Every day | ORAL | Status: DC
  Administered 2011-09-09 – 2011-09-12 (×4): 100 mg via ORAL
  Filled 2011-09-09 (×4): qty 1

## 2011-09-09 MED ORDER — OLOPATADINE HCL 0.2 % OP SOLN: 1.0000 [drp] | Freq: Every day | OPHTHALMIC | Status: DC | PRN

## 2011-09-09 MED ORDER — GI COCKTAIL ~~LOC~~
30.0000 mL | Freq: Once | ORAL | Status: AC
  Administered 2011-09-09: 30 mL via ORAL
  Filled 2011-09-09: qty 30

## 2011-09-09 MED ORDER — VENLAFAXINE HCL ER 75 MG PO CP24
75.0000 mg | ORAL_CAPSULE | Freq: Every day | ORAL | Status: DC
  Administered 2011-09-09 – 2011-09-12 (×4): 75 mg via ORAL
  Filled 2011-09-09 (×4): qty 1

## 2011-09-09 MED ORDER — CLONIDINE HCL 0.1 MG PO TABS
0.1000 mg | ORAL_TABLET | Freq: Every day | ORAL | Status: DC
  Administered 2011-09-09 – 2011-09-12 (×4): 0.1 mg via ORAL
  Filled 2011-09-09 (×4): qty 1

## 2011-09-09 MED ORDER — HEPARIN SOD (PORCINE) IN D5W 100 UNIT/ML IV SOLN
2300.0000 [IU]/h | INTRAVENOUS | Status: DC
  Administered 2011-09-10 (×2): 1250 [IU]/h via INTRAVENOUS
  Administered 2011-09-10: 1950 [IU]/h via INTRAVENOUS
  Administered 2011-09-10: 1600 [IU]/h via INTRAVENOUS
  Administered 2011-09-11: 2500 [IU]/h via INTRAVENOUS
  Administered 2011-09-11: 1950 [IU]/h via INTRAVENOUS
  Administered 2011-09-12: 2500 [IU]/h via INTRAVENOUS
  Administered 2011-09-12: 2700 [IU]/h via INTRAVENOUS
  Filled 2011-09-09 (×7): qty 250

## 2011-09-09 MED ORDER — ONDANSETRON HCL 4 MG/2ML IJ SOLN: 4.0000 mg | Freq: Four times a day (QID) | INTRAMUSCULAR | Status: DC | PRN

## 2011-09-09 MED ORDER — FLEET ENEMA 7-19 GM/118ML RE ENEM
1.0000 | ENEMA | Freq: Once | RECTAL | Status: AC | PRN
  Filled 2011-09-09: qty 1

## 2011-09-09 MED ORDER — HYDROMORPHONE HCL PF 1 MG/ML IJ SOLN
1.0000 mg | Freq: Once | INTRAMUSCULAR | Status: AC
  Administered 2011-09-09: 1 mg via INTRAVENOUS
  Filled 2011-09-09: qty 1

## 2011-09-09 MED ORDER — FUROSEMIDE 20 MG PO TABS
20.0000 mg | ORAL_TABLET | Freq: Every day | ORAL | Status: DC
  Administered 2011-09-09 – 2011-09-12 (×4): 20 mg via ORAL
  Filled 2011-09-09 (×4): qty 1

## 2011-09-09 MED ORDER — SODIUM CHLORIDE 0.9 % IV SOLN
Freq: Once | INTRAVENOUS | Status: AC
  Administered 2011-09-09: 09:00:00 via INTRAVENOUS

## 2011-09-09 MED ORDER — OLOPATADINE HCL 0.1 % OP SOLN
1.0000 [drp] | Freq: Two times a day (BID) | OPHTHALMIC | Status: DC | PRN
  Filled 2011-09-09: qty 5

## 2011-09-09 MED ORDER — PREDNISONE 5 MG PO TABS
5.0000 mg | ORAL_TABLET | Freq: Every day | ORAL | Status: DC
  Administered 2011-09-09 – 2011-09-12 (×4): 5 mg via ORAL
  Filled 2011-09-09 (×4): qty 1

## 2011-09-09 MED ORDER — PANTOPRAZOLE SODIUM 40 MG PO TBEC
40.0000 mg | DELAYED_RELEASE_TABLET | Freq: Every day | ORAL | Status: DC
  Administered 2011-09-09 – 2011-09-12 (×4): 40 mg via ORAL
  Filled 2011-09-09 (×4): qty 1

## 2011-09-09 MED ORDER — BENZONATATE 100 MG PO CAPS
100.0000 mg | ORAL_CAPSULE | Freq: Three times a day (TID) | ORAL | Status: DC | PRN
  Filled 2011-09-09: qty 1

## 2011-09-09 MED ORDER — AZITHROMYCIN 250 MG PO TABS
250.0000 mg | ORAL_TABLET | Freq: Every day | ORAL | Status: AC
  Administered 2011-09-10 – 2011-09-11 (×3): 250 mg via ORAL
  Filled 2011-09-09 (×3): qty 1

## 2011-09-09 MED ORDER — HYDROMORPHONE HCL PF 1 MG/ML IJ SOLN
1.0000 mg | Freq: Once | INTRAMUSCULAR | Status: AC
  Administered 2011-09-09: 1 mg via INTRAVENOUS

## 2011-09-09 MED ORDER — NITROGLYCERIN 0.4 MG SL SUBL
0.4000 mg | SUBLINGUAL_TABLET | SUBLINGUAL | Status: DC | PRN
  Administered 2011-09-09 – 2011-09-11 (×2): 0.4 mg via SUBLINGUAL
  Filled 2011-09-09 (×2): qty 25

## 2011-09-09 MED ORDER — ASPIRIN 81 MG PO CHEW
CHEWABLE_TABLET | ORAL | Status: AC
  Filled 2011-09-09: qty 4

## 2011-09-09 MED ORDER — MORPHINE SULFATE 4 MG/ML IJ SOLN
4.0000 mg | INTRAMUSCULAR | Status: DC | PRN
  Administered 2011-09-09 – 2011-09-10 (×4): 4 mg via INTRAVENOUS
  Filled 2011-09-09 (×4): qty 1

## 2011-09-09 MED ORDER — IOHEXOL 350 MG/ML SOLN
150.0000 mL | Freq: Once | INTRAVENOUS | Status: AC | PRN
  Administered 2011-09-09: 150 mL via INTRAVENOUS

## 2011-09-09 MED ORDER — CHOLECALCIFEROL 25 MCG (1000 UT) PO TABS
2000.0000 [IU] | ORAL_TABLET | Freq: Every day | ORAL | Status: DC
  Administered 2011-09-10 – 2011-09-12 (×3): 2000 [IU] via ORAL
  Filled 2011-09-09 (×4): qty 2

## 2011-09-09 MED ORDER — ASPIRIN 81 MG PO CHEW
324.0000 mg | CHEWABLE_TABLET | Freq: Once | ORAL | Status: AC
  Administered 2011-09-09: 324 mg via ORAL

## 2011-09-09 MED ORDER — FENOFIBRATE 160 MG PO TABS
160.0000 mg | ORAL_TABLET | Freq: Every day | ORAL | Status: DC
  Administered 2011-09-09 – 2011-09-12 (×4): 160 mg via ORAL
  Filled 2011-09-09 (×4): qty 1

## 2011-09-09 MED ORDER — BUPROPION HCL ER (XL) 300 MG PO TB24
300.0000 mg | ORAL_TABLET | Freq: Every day | ORAL | Status: DC
  Administered 2011-09-09 – 2011-09-12 (×4): 300 mg via ORAL
  Filled 2011-09-09 (×4): qty 1

## 2011-09-09 MED ORDER — SENNA 8.6 MG PO TABS
1.0000 | ORAL_TABLET | Freq: Two times a day (BID) | ORAL | Status: DC
  Administered 2011-09-09 – 2011-09-12 (×6): 8.6 mg via ORAL
  Filled 2011-09-09 (×7): qty 1

## 2011-09-09 MED ORDER — POTASSIUM CHLORIDE CRYS ER 20 MEQ PO TBCR
20.0000 meq | EXTENDED_RELEASE_TABLET | Freq: Every day | ORAL | Status: DC
  Administered 2011-09-09 – 2011-09-12 (×4): 20 meq via ORAL
  Filled 2011-09-09 (×4): qty 1

## 2011-09-09 MED ORDER — NITROGLYCERIN 0.4 MG SL SUBL
0.4000 mg | SUBLINGUAL_TABLET | Freq: Once | SUBLINGUAL | Status: AC
  Administered 2011-09-09: 0.4 mg via SUBLINGUAL
  Filled 2011-09-09: qty 25

## 2011-09-09 NOTE — ED Notes (Signed)
Pain med given.  Pt taken to c-t

## 2011-09-09 NOTE — ED Notes (Signed)
CBG 168. 

## 2011-09-09 NOTE — ED Provider Notes (Addendum)
Medical screening examination/treatment/procedure(s) were performed by non-physician practitioner and as supervising physician I was immediately available for consultation/collaboration.   Benny Lennert, MD 09/09/11 1044   I saw and evaluated the patient, reviewed the resident's note and I agree with the findings and plan.   Pt complains of chest pain and pe lungs clear heart rrr  Benny Lennert, MD 09/09/11 1546

## 2011-09-09 NOTE — ED Provider Notes (Addendum)
History     CSN: [id number] Arrival date & time: 09/09/2011  7:22 AM   First MD Initiated Contact with Patient 09/09/11 0732      No chief complaint on file.   (Consider location/radiation/quality/duration/timing/severity/associated sxs/prior treatment) HPI  This is a 50 year old female with PMH of HTN, DM type II, Obesity and sarcoidosis who presents to the ED with Chest pain. The history is provided by patient. Patient states that she started to have left sided chest pain at 7 pm last night when she was [place] the [place]ighborhood. Sudden onset, sharp pain 9-10/10, constant, radiating to her left arm and back, accompanied with heart racing at times and shortness of breath with ambulation. No aggravating or alleviating factors. Pt took some tylenol and Daxilant without any relief. Denies sick contact or recent travel. Denies any associated anxious or stressful events. Denies diaphoresis or chest pressure. Denies any anxious or stressful events. Denies fever, chills or sore throat. Denies nausea, vomiting or abdominal pain. Denies diarrhea or constipation. Denies weakness, tingling or numbness.  Of note, Pt saw her PCP on 09/07/2011 for 3-day cough and sinus problem with 2-day history of sharp chest pain around sternum radiating to her back, which subsided on the day of her office visit. She was given Z-pack and started her first dose last night.   Her most recent CTA chest on 05/27/2011 shows no evidence of PE or pulmonary sarcoidosis.      Past Medical History  Diagnosis Date  . Sarcoidosis 04/10/2007  . DIABETES MELLITUS, TYPE II 08/21/2006  . HYPERLIPIDEMIA 08/21/2006  . GOUT 08/20/2010  . OBESITY 06/04/2009  . ANEMIA-UNSPECIFIED 09/18/2009  . HYPERTENSION 08/21/2006  . GERD 08/21/2006  . SLEEP APNEA 04/21/2009    Past Surgical History  Procedure Date  . Ventral hernia repair   . Nissen fundoplication   . Cholecystectomy   . Abdominal hysterectomy   . Knee arthroscopy       right    Family History  Problem Relation Age of Onset  . Diabetes Father   . Heart attack Father   . COPD Mother   . Emphysema Mother   . Asthma Mother   . Sarcoidosis Maternal Uncle   . Colon cancer Neg Hx     History  Substance Use Topics  . Smoking status: Never Smoker   . Smokeless tobacco: Never Used  . Alcohol Use: No    OB History    Grav Para Term Preterm Abortions TAB SAB Ect Mult Living                  Review of Systems  see HPI  Allergies  Methotrexate  Home Medications   Current Outpatient Rx  Name Route Sig Dispense Refill  . HUMIRA PEN [place]  Injection every 2 weeks.     . ASPIRIN 81 MG PO TABS Oral Take 81 mg by mouth daily.      . ATENOLOL 50 MG PO TABS Oral Take 50 mg by mouth 2 (two) times daily.     . AZITHROMYCIN 250 MG PO TABS  Take 2 tablets (500 mg) on  Day 1,  followed by 1 tablet (250 mg) once daily on Days 2 through 5. 6 each 0  . BENZONATATE 100 MG PO CAPS Oral Take 1 capsule (100 mg total) by mouth 3 (three) times daily as needed. 30 capsule 3  . BUPROPION HCL ER (XL) 300 MG PO TB24  TAKE ONE TABLET BY MOUTH ONE TIME  DAILY 30 tablet 3  . HYDROCOD POLST-CHLORPHEN POLST 10-8 MG/5ML PO LQCR Oral Take 5 mLs by mouth every 12 (twelve) hours as needed. 140 mL 1  . HYDROCOD POLST-CHLORPHEN POLST 10-8 MG/5ML PO LQCR Oral Take 5 mLs by mouth every 12 (twelve) hours as needed (cough). 240 mL 0  . CHOLECALCIFEROL 1000 UNITS PO TABS Oral Take 1,000 Units by mouth daily.      [name] CICLESONIDE 50 MCG/ACT Mansoor Hillyard SUSP Each Nare Place 2 sprays into both nostrils daily.     [name] CLONIDINE HCL 0.1 MG PO TABS Oral Take 0.1 mg by mouth daily.     [name] COLCRYS 0.6 MG PO TABS Oral Take 1 tablet by mouth Daily.    [name] DEXILANT 60 MG PO CPDR  TAKE ONE CAPSULE BY MOUTH ONE TIME DAILY 30 capsule 11  . ESTRADIOL 1 MG PO TABS Oral Take 2 mg by mouth daily.     . FENOFIBRATE 145 MG PO TABS Oral Take 145 mg by mouth daily.      . FUROSEMIDE 20 MG PO TABS  TAKE ONE TABLET BY MOUTH  ONE TIME DAILY 30 tablet 1  . GLUCOSE BLOOD VI STRP  Use as instructed 100 each 12  . HYDROCODONE-ACETAMINOPHEN 5-325 MG PO TABS Oral Take 2 tablets by mouth every 6 (six) hours as needed for pain. 20 tablet 0  . LISINOPRIL 5 MG PO TABS Oral Take 1 tablet (5 mg total) by mouth daily. 30 tablet 5  . LORAZEPAM 1 MG PO TABS Oral Take 1 tablet (1 mg total) by mouth 2 (two) times daily as needed. 30 tablet 0  . METFORMIN HCL 500 MG PO TABS Oral Take 1 tablet (500 mg total) by mouth 2 (two) times daily with a meal. 60 tablet 5  . NITROGLYCERIN 0.4 MG SL SUBL Sublingual Place 0.4 mg under the tongue every 5 (five) minutes as needed.      . NYSTATIN-TRIAMCINOLONE 100000-0.1 UNIT/GM-% EX CREA Topical Apply 100,000 g topically 2 (two) times daily as needed.      [name] PATADAY 0.2 % OP SOLN Both Eyes Place 1 drop into both eyes Daily.    [name] POTASSIUM CHLORIDE CRYS CR 20 MEQ PO TBCR  TAKE ONE TABLET BY MOUTH THREE TIMES DAILY 90 tablet 0  . PREDNISONE 5 MG PO TABS Oral Take 5 mg by mouth daily.     . VENLAFAXINE HCL 75 MG PO CP24  TAKE ONE CAPSULE BY MOUTH TWICE DAILY 60 capsule 1    BP 142/55  Pulse 77  Temp(Src) 97.9 F (36.6 C) (Oral)  Resp 18  SpO2 98%  Physical Exam General: NAD. alert, well-developed, and cooperative to examination.  Head: normocephalic and atraumatic.  Eyes: vision grossly intact, pupils equal, pupils round, pupils reactive to light, no injection and anicteric.  Mouth: pharynx pink and moist, no erythema, and no exudates.  Neck: supple, full ROM, no thyromegaly, no JVD, and no carotid bruits.  Lungs: normal respiratory effort, no accessory muscle use, normal breath sounds, no crackles, and no wheezes. Heart: normal rate, regular rhythm, no murmur, no gallop, and no rub.  Abdomen: soft, non-tender, normal bowel sounds, no distention, no guarding, no rebound tenderness, no hepatomegaly, and no splenomegaly.  Msk: no joint swelling, no joint warmth, and no redness over joints.    Pulses: 2+ DP/PT pulses bilaterally Extremities: No cyanosis, clubbing, edema Neurologic: alert & oriented X3, cranial nerves II-XII intact, strength normal in all extremities, sensation intact to light touch,  and gait normal.  Skin: turgor normal and no rashes.  Psych: Oriented X3, memory intact for recent and remote, normally interactive, good eye contact, not anxious appearing, and not depressed appearing.   ED Course  Procedures (including critical care time)  Date: 09/09/2011  Rate: 82  Rhythm: normal sinus rhythm  QRS Axis: normal  Intervals: normal  ST/T Wave abnormalities: normal  Conduction Disutrbances:none  Narrative Interpretation:   Old EKG Reviewed: unchanged  Results for orders placed during the hospital encounter of 09/09/11  CBC      Component Value Range   WBC 11.7 (*) 4.0 - 10.5 (K/uL)   RBC 3.78 (*) 3.87 - 5.11 (MIL/uL)   Hemoglobin 11.7 (*) 12.0 - 15.0 (g/dL)   HCT 40.9 (*) 81.1 - 46.0 (%)   MCV 92.3  78.0 - 100.0 (fL)   MCH 31.0  26.0 - 34.0 (pg)   MCHC 33.5  30.0 - 36.0 (g/dL)   RDW 91.4  78.2 - 95.6 (%)   Platelets 285  150 - 400 (K/uL)  DIFFERENTIAL      Component Value Range   Neutrophils Relative 64  43 - 77 (%)   Neutro Abs 7.4  1.7 - 7.7 (K/uL)   Lymphocytes Relative 28  12 - 46 (%)   Lymphs Abs 3.2  0.7 - 4.0 (K/uL)   Monocytes Relative 6  3 - 12 (%)   Monocytes Absolute 0.7  0.1 - 1.0 (K/uL)   Eosinophils Relative 2  0 - 5 (%)   Eosinophils Absolute 0.3  0.0 - 0.7 (K/uL)   Basophils Relative 1  0 - 1 (%)   Basophils Absolute 0.1  0.0 - 0.1 (K/uL)  BASIC METABOLIC PANEL      Component Value Range   Sodium 138  135 - 145 (mEq/L)   Potassium 3.9  3.5 - 5.1 (mEq/L)   Chloride 99  96 - 112 (mEq/L)   CO2 27  19 - 32 (mEq/L)   Glucose, Bld 160 (*) 70 - 99 (mg/dL)   BUN 11  6 - 23 (mg/dL)   Creatinine, Ser 2.13  0.50 - 1.10 (mg/dL)   Calcium 9.3  8.4 - 08.6 (mg/dL)   GFR calc non Af Amer >90  >90 (mL/min)   GFR calc Af Amer >90  >90  (mL/min)  CARDIAC PANEL(CRET KIN+CKTOT+MB+TROPI)      Component Value Range   Total CK 30  7 - 177 (U/L)   CK, MB 2.1  0.3 - 4.0 (ng/mL)   Troponin I <0.30  <0.30 (ng/mL)   Relative Index RELATIVE INDEX IS INVALID  0.0 - 2.5   APTT      Component Value Range   aPTT 28  24 - 37 (seconds)  PROTIME-INR      Component Value Range   Prothrombin Time 13.3  11.6 - 15.2 (seconds)   INR 0.99  0.00 - 1.49   D-DIMER, QUANTITATIVE      Component Value Range   D-Dimer, Quant 0.27  0.00 - 0.48 (ug/mL-FEU)  GLUCOSE, CAPILLARY      Component Value Range   Glucose-Capillary 168 (*) 70 - 99 (mg/dL)   Dg Chest 2 View  57/84/6962  *RADIOLOGY REPORT*  Clinical Data: Chest pain.  CHEST - 2 VIEW  Comparison: 05/27/2011  Findings: Heart and mediastinal contours are within normal limits. No focal opacities or effusions.  No acute bony abnormality.  IMPRESSION: No active cardiopulmonary disease.  Original Report Authenticated By: Cyndie Chime, M.D.  MDM  1. Chest pain Pt presents to the ED with acute onset chest sharp pain x 1 day with radiation to her left arm and back, accompanied with Shortness of breath at times. Recent history of URI with mid sternum chest sharp pain evaluated and treated with Z-pack by her PCP on 09/07/11.  She also has History of Sarcoidosis.  The DDX include cardiac source, vascular source like aortic dissection, respiratory infection or PE. The characteristic of her chest pain is not consistent with cardiac ischemic problem and her EKG is normal, however, we will check her cardiac markers given the fact that she has multiple risk factors of CAD. Pt states that her HTN has been well controlled and denies any diaphoresis or anxiety since the initiation of her chest pain. The possibility of aortic dissection is very low and we will check her chest X ray. We also need to rule out PE given her multiple risk factors including sarcoidosis. - check cardiac markers, D-dimer -  check chest X ray -low threshold for CTA if her D dimer is positive        Dede Query, MD 09/09/11 4098  Dede Query, MD 09/09/11 1191  Dede Query, MD 09/09/11 1012  Dede Query, MD 09/09/11 1224

## 2011-09-09 NOTE — ED Notes (Signed)
The rn from 3700 came down to check appropriateness for 3700.  At present the pt continues to have chest pain.  3700 not comfortable to take

## 2011-09-09 NOTE — ED Notes (Signed)
Morphine given for  4/10 chest pain.  Eating snacks

## 2011-09-09 NOTE — Consult Note (Signed)
ANTICOAGULATION CONSULT NOTE - Initial Consult  Pharmacy Consult for: Heparin Indication: chest pain/ACS  Allergies  Allergen Reactions  . Lisinopril Cough  . Methotrexate     REACTION: peri-oral and buccal lesions.    Patient Measurements: Height: 5\' 6"  (167.6 cm) Weight: 262 lb 12.6 oz (119.2 kg) IBW/kg (Calculated) : 59.3  Heparin dosing weight: 87.6kg  Vital Signs: Temp: 98.5 F (36.9 C) (11/29 1954) Temp src: Oral (11/29 1954) BP: 151/80 mmHg (11/29 2121) Pulse Rate: 99  (11/29 2121)  Labs:  Basename 09/09/11 0748  HGB 11.7*  HCT 34.9*  PLT 285  APTT 28  LABPROT 13.3  INR 0.99  HEPARINUNFRC --  CREATININE 0.55  CKTOTAL 30  CKMB 2.1  TROPONINI <0.30   Estimated Creatinine Clearance: 108.2 ml/min (by C-G formula based on Cr of 0.55).  Medical History: Past Medical History  Diagnosis Date  . Sarcoidosis 04/10/2007  . DIABETES MELLITUS, TYPE II 08/21/2006  . HYPERLIPIDEMIA 08/21/2006  . GOUT 08/20/2010  . OBESITY 06/04/2009  . ANEMIA-UNSPECIFIED 09/18/2009  . HYPERTENSION 08/21/2006  . GERD 08/21/2006  . SLEEP APNEA 04/21/2009  . Shortness of breath    Assessment: 50yof to begin heparin for chest pain. CT angio negative for aortic dissection.  Goal of Therapy:  Heparin level 0.3-0.7 units/ml   Plan:  1) Heparin bolus 4000 units x 1 2) Heparin drip at 1250 units/hr 3) 6h heparin level 4) Daily heparin level and CBC  Fredrik Rigger 09/09/2011,10:25 PM

## 2011-09-09 NOTE — Consult Note (Signed)
Reason for Consult: Chest pain   Referring Physician: Dr. Dede Query  ER- MD    [name] is an 49 y.o. female.    Chief Complaint: chest pain associated with SOB.     HPI: 51 year old, white, married female, with 2 children presents to ER  After developing chest pain yesterday. She was at her mothers yesterday moving closes to a closet and became significantly short of breath and was diaphoretic. Approximately at the same time she developed left anterior chest pain with radiation through to her back to just under her scapula on the left. This is a sharp pain she took Tylenol without relief. She presented to the emergency room today for continued pain here she was given sublingual nitroglycerin with mild relief and has had 2 doses of IV morphine since that time. Her pain continues despite the morphine. There is much better than it was on admission. Associated symptoms included shortness of breath diaphoresis initially and continued queasy feeling.  Patient has had a cardiac catheterization 07/20/2010 for chest pain and that test was normal with patent coronary arteries and an EF of 50-55% with very mild global hypokinesis. She was recently at [facility] with chest pain in August of this year cardiac enzymes were negative and she has followed up with Dr. Clarene Duke since that episode. During that hospitalization she had a CT scan of her chest that was negative for pulmonary emboli and revealed no evidence of pulmonary sarcoid but she has pulmonary sarcoid and has been treated for this for several years. During that hospitalization her prednisone was increased and her pain resolved. Though she tells me today the pain she's currently having is not like the pain she was seen for in August of 2012.  She also had a 2-D echo 06/01/2011 EF is greater than 55% there are no acute changes from previous echoes.     Past Medical History  Diagnosis Date  . Sarcoidosis 04/10/2007  . DIABETES MELLITUS,  TYPE II 08/21/2006  . HYPERLIPIDEMIA 08/21/2006  . GOUT 08/20/2010  . OBESITY 06/04/2009  . ANEMIA-UNSPECIFIED 09/18/2009  . HYPERTENSION 08/21/2006  . GERD 08/21/2006  . SLEEP APNEA 04/21/2009  . Shortness of breath     Past Surgical History  Procedure Date  . Ventral hernia repair   . Nissen fundoplication   . Cholecystectomy   . Abdominal hysterectomy   . Knee arthroscopy     right    Family History  Problem Relation Age of Onset  . Diabetes Father   . Heart attack Father   . Coronary artery disease Father   . COPD Mother   . Emphysema Mother   . Asthma Mother   . Sarcoidosis Maternal Uncle   . Colon cancer Neg Hx   . Lung cancer Brother    Social History:  reports that she has never smoked. She has never used smokeless tobacco. She reports that she does not drink alcohol or use illicit drugs.  Allergies:  Allergies  Allergen Reactions  . Lisinopril Cough  . Methotrexate     REACTION: peri-oral and buccal lesions.    Medications Prior to Admission  Medication Dose Route Frequency Provider Last Rate Last Dose  . 0.9 %  sodium chloride infusion   Intravenous Once Scarlette Calico C. [place], [place] 20 mL/hr at 09/09/11 0845    . aspirin chewable tablet 324 mg  324 mg Oral Once Na Li, MD   324 mg at 09/09/11 0809  . gi cocktail  30 mL Oral Once Leone Brand, NP      . HYDROmorphone (DILAUDID) 1 MG/ML injection        1 mg at 09/09/11 0930  . HYDROmorphone (DILAUDID) injection 1 mg  1 mg Intravenous Once Na Li, MD   1 mg at 09/09/11 0846  . HYDROmorphone (DILAUDID) injection 1 mg  1 mg Intravenous Once Na Li, MD   1 mg at 09/09/11 1136  . nitroGLYCERIN (NITROSTAT) SL tablet 0.4 mg  0.4 mg Sublingual Once Na Li, MD   0.4 mg at 09/09/11 1137   Medications Prior to Admission  Medication Sig Dispense Refill  . Adalimumab (HUMIRA PEN ) Inject 40 mg into the skin every 14 (fourteen) days. Injection every 2 weeks.      [name] aspirin 81 MG tablet Take 81 mg by mouth daily.        [name]  atenolol (TENORMIN) 50 MG tablet Take 50 mg by mouth daily.       [name] azithromycin (ZITHROMAX Z-PAK) 250 MG tablet Take 2 tablets (500 mg) on  Day 1,  followed by 1 tablet (250 mg) once daily on Days 2 through 5.  6 each  0  . benzonatate (TESSALON) 100 MG capsule Take 1 capsule (100 mg total) by mouth 3 (three) times daily as needed.  30 capsule  3  . buPROPion (WELLBUTRIN XL) 300 MG 24 hr tablet TAKE ONE TABLET BY MOUTH ONE TIME DAILY  30 tablet  3  . chlorpheniramine-HYDROcodone (TUSSIONEX) 10-8 MG/5ML LQCR Take 5 mLs by mouth every 12 (twelve) hours as needed.  140 mL  1  . Cholecalciferol (GNP VITAMIN D) 1000 UNITS tablet Take 2,000 Units by mouth daily.       . ciclesonide (OMNARIS) 50 MCG/ACT nasal spray Place 2 sprays into both nostrils daily.       . cloNIDine (CATAPRES) 0.1 MG tablet Take 0.1 mg by mouth daily.       [name] DEXILANT 60 MG capsule TAKE ONE CAPSULE BY MOUTH ONE TIME DAILY  30 capsule  11  . estradiol (ESTRACE) 1 MG tablet Take 2 mg by mouth daily.       . fenofibrate (TRICOR) 145 MG tablet Take 145 mg by mouth daily.        . furosemide (LASIX) 20 MG tablet TAKE ONE TABLET BY MOUTH ONE TIME DAILY  30 tablet  1  . HYDROcodone-acetaminophen (NORCO) 5-325 MG per tablet Take 2 tablets by mouth every 6 (six) hours as needed for pain.  20 tablet  0  . LORazepam (ATIVAN) 1 MG tablet Take 1 tablet (1 mg total) by mouth 2 (two) times daily as needed.  30 tablet  0  . metFORMIN (GLUCOPHAGE) 500 MG tablet Take 1 tablet (500 mg total) by mouth 2 (two) times daily with a meal.  60 tablet  5  . nitroGLYCERIN (NITROSTAT) 0.4 MG SL tablet Place 0.4 mg under the tongue every 5 (five) minutes as needed.        [name] PATADAY 0.2 % SOLN Place 1 drop into both eyes daily as needed.       . potassium chloride SA (K-DUR,KLOR-CON) 20 MEQ tablet TAKE ONE TABLET BY MOUTH THREE TIMES DAILY  90 tablet  0  . predniSONE (DELTASONE) 5 MG tablet Take 5 mg by mouth daily.       [name] glucose blood (FREESTYLE LITE) test  strip Use as instructed  100 each  12    Results for orders  placed during the hospital encounter of 09/09/11 (from the past 48 hour(s))  CBC     Status: Abnormal   Collection Time   09/09/11  7:48 AM      Component Value Range Comment   WBC 11.7 (*) 4.0 - 10.5 (K/uL)    RBC 3.78 (*) 3.87 - 5.11 (MIL/uL)    Hemoglobin 11.7 (*) 12.0 - 15.0 (g/dL)    HCT 16.1 (*) 09.6 - 46.0 (%)    MCV 92.3  78.0 - 100.0 (fL)    MCH 31.0  26.0 - 34.0 (pg)    MCHC 33.5  30.0 - 36.0 (g/dL)    RDW 04.5  40.9 - 81.1 (%)    Platelets 285  150 - 400 (K/uL)   DIFFERENTIAL     Status: Normal   Collection Time   09/09/11  7:48 AM      Component Value Range Comment   Neutrophils Relative 64  43 - 77 (%)    Neutro Abs 7.4  1.7 - 7.7 (K/uL)    Lymphocytes Relative 28  12 - 46 (%)    Lymphs Abs 3.2  0.7 - 4.0 (K/uL)    Monocytes Relative 6  3 - 12 (%)    Monocytes Absolute 0.7  0.1 - 1.0 (K/uL)    Eosinophils Relative 2  0 - 5 (%)    Eosinophils Absolute 0.3  0.0 - 0.7 (K/uL)    Basophils Relative 1  0 - 1 (%)    Basophils Absolute 0.1  0.0 - 0.1 (K/uL)   BASIC METABOLIC PANEL     Status: Abnormal   Collection Time   09/09/11  7:48 AM      Component Value Range Comment   Sodium 138  135 - 145 (mEq/L)    Potassium 3.9  3.5 - 5.1 (mEq/L)    Chloride 99  96 - 112 (mEq/L)    CO2 27  19 - 32 (mEq/L)    Glucose, Bld 160 (*) 70 - 99 (mg/dL)    BUN 11  6 - 23 (mg/dL)    Creatinine, Ser 9.14  0.50 - 1.10 (mg/dL)    Calcium 9.3  8.4 - 10.5 (mg/dL)    GFR calc non Af Amer >90  >90 (mL/min)    GFR calc Af Amer >90  >90 (mL/min)   CARDIAC PANEL(CRET KIN+CKTOT+MB+TROPI)     Status: Normal   Collection Time   09/09/11  7:48 AM      Component Value Range Comment   Total CK 30  7 - 177 (U/L)    CK, MB 2.1  0.3 - 4.0 (ng/mL)    Troponin I <0.30  <0.30 (ng/mL)    Relative Index RELATIVE INDEX IS INVALID  0.0 - 2.5    APTT     Status: Normal   Collection Time   09/09/11  7:48 AM      Component Value Range Comment    aPTT 28  24 - 37 (seconds)   PROTIME-INR     Status: Normal   Collection Time   09/09/11  7:48 AM      Component Value Range Comment   Prothrombin Time 13.3  11.6 - 15.2 (seconds)    INR 0.99  0.00 - 1.49    D-DIMER, QUANTITATIVE     Status: Normal   Collection Time   09/09/11  7:48 AM      Component Value Range Comment   D-Dimer, Quant 0.27  0.00 - 0.48 (ug/mL-FEU)  LIPASE, BLOOD     Status: Normal   Collection Time   09/09/11  7:48 AM      Component Value Range Comment   Lipase 18  11 - 59 (U/L)   GLUCOSE, CAPILLARY     Status: Abnormal   Collection Time   09/09/11  8:05 AM      Component Value Range Comment   Glucose-Capillary 168 (*) 70 - 99 (mg/dL)    Dg Chest 2 View  16/07/9603  *RADIOLOGY REPORT*  Clinical Data: Chest pain.  CHEST - 2 VIEW  Comparison: 05/27/2011  Findings: Heart and mediastinal contours are within normal limits. No focal opacities or effusions.  No acute bony abnormality.  IMPRESSION: No active cardiopulmonary disease.  Original Report Authenticated By: Cyndie Chime, M.D.    ROS: General: Been treated for sinusitis and at that visit Dr. Clent Ridges noted chest wall pain costochondritis. Skin without rashes or ulcers. HEENT: No blurred vision or double vision. Cardiovascular: Chest pain as described. Pulmonary: Complained of shortness of breath. GI: pt. Stated if  she could belch she would feel better.  no diarrhea, no constipation, no melena. GU: No hematuria no dysuria. Musculoskeletal: currently negative.  Neuro: No syncope No long trips recently   Blood pressure 158/81, pulse 83, temperature 98.5 F (36.9 C), temperature source Oral, resp. rate 20, SpO2 100.00%. PE: general: Alert oriented white female obviously not feeling well for pleasant affect. Skin: Warm and dry brisk to refill. HEENT: Normocephalic sclera clear. Heart: S1-S2 regular rate and rhythm without murmur gallop rub or click. Lungs: Clear without rales rhonchi or  wheezes. Abdomen: Obese soft nontender positive bowel sounds in palpate liver spleen or masses. Musculoskeletal no lower extremity edema, positive pedal pulses one plus. Neuro: Alert oriented x3, moves all extremities follows commands.   Assessment/Plan Patient Active Problem List  Diagnoses  . Sarcoidosis of lung  . HYPERLIPIDEMIA  . GOUT  . OBESITY  . HYPERTENSION  . GERD  . SLEEP APNEA  . DIABETES MELLITUS, TYPE II  . Chest pain at rest   PLAN:  Per dr, Christelle Igoe.  [name],[name] 09/09/2011, 3:04 PM   Agree with note written by Nada Boozer RNP  Pt known to our service. She sees Dr. Mindi Junker. In the office. I cathed her 1 year ago and found nl cors and LV. She also has Pulm sarcoidosis. Other probs as out lined. Mult + CRF. She developed sharp mid sternal CP radiating to her back and LUE with SOB yesterday which has persisted through today. She appears uncomfortable. Exam benign. EKG without acute changes. Enz - x1 and CXR nl. It is unlikely that this is heart related but not impossible. Rec CTA to [name]/O Ao dissecion, If neg would heparinize, ROMI, Rx for GERD with BID PPI. Decide re cath in AM.  Runell Gess 09/09/2011 3:26 PM

## 2011-09-09 NOTE — ED Notes (Signed)
Pt resting and watching tv; states "I'm feeling a little better now"; family at bedside; pt has no needs at this time

## 2011-09-09 NOTE — ED Notes (Signed)
The pt is drowsy but arrouses with minimal stimulation..  The family at the bedside.

## 2011-09-09 NOTE — H&P (Signed)
Hospital Admission Note Date: 09/09/2011  Patient name: [name] Medical record number: [id number] Date of birth: 28-Jul-1961 Age: 50 y.o. Gender: female PCP: [name] Petit, MD, MD  Attending physician: [name]. [name]  Chief Complaint: Chest pain  History of Present Illness: [name] is a 50 year old morbidly obese female who states that she started having chest pain approximately 2 days ago when she was assisting her mother with lifting clothes out of a closet. She states that the pain is in her left anterior chest and radiating directly backwards to her back. She rates the pain as a 9/10 and constant without any palliative or provocative features. The patient states that IV pain medication has brought her pain from a 9 to approximately 5/10. The pain at the time of onset was associated with some shortness of breath. The patient also states that she's noted that approximately 2 weeks prior to this and she is having shortness of breath with walking usual distances. She's had no dizziness, no lightheadedness, no near syncope, or syncope. She's had no fever chills no nausea, vomiting, or diarrhea. She's had no recent weight loss or weight gain she's had no dysuria urgency or frequency. The patient does have a history of pulmonary sarcoidosis and is followed by Dr. Shan [name]. She also was seen by Dr. Caprice [name] in the cardiology office where she had a cardiac cath done approximately one year ago which showed normal coronary arteries. Scheduled Meds:   . sodium chloride   Intravenous Once  . aspirin  324 mg Oral Once  . gi cocktail  30 mL Oral Once  . HYDROmorphone      .  HYDROmorphone (DILAUDID) injection  1 mg Intravenous Once  .  HYDROmorphone (DILAUDID) injection  1 mg Intravenous Once  . nitroGLYCERIN  0.4 mg Sublingual Once   Continuous Infusions:  PRN Meds:.iohexol, morphine injection Allergies: Lisinopril and Methotrexate Past Medical History  Diagnosis Date  .  Sarcoidosis 04/10/2007  . DIABETES MELLITUS, TYPE II 08/21/2006  . HYPERLIPIDEMIA 08/21/2006  . GOUT 08/20/2010  . OBESITY 06/04/2009  . ANEMIA-UNSPECIFIED 09/18/2009  . HYPERTENSION 08/21/2006  . GERD 08/21/2006  . SLEEP APNEA 04/21/2009  . Shortness of breath    Past Surgical History  Procedure Date  . Ventral hernia repair   . Nissen fundoplication   . Cholecystectomy   . Abdominal hysterectomy   . Knee arthroscopy     right  . Tubal ligation    Family History  Problem Relation Age of Onset  . Diabetes Father   . Heart attack Father   . Coronary artery disease Father   . COPD Mother   . Emphysema Mother   . Asthma Mother   . Sarcoidosis Maternal Uncle   . Colon cancer Neg Hx   . Lung cancer Brother    History   Social History  . Marital Status: Married    Spouse Name: N/A    Number of Children: N/A  . Years of Education: N/A   Occupational History  . DISABLED    Social History Main Topics  . Smoking status: Never Smoker   . Smokeless tobacco: Never Used  . Alcohol Use: No  . Drug Use: No  . Sexually Active: Yes   Other Topics Concern  . Not on file   Social History Narrative  . No narrative on file   Review of Systems: A comprehensive review of systems was negative except as noted in the history of present illness. Physical Exam: No  intake or output data in the 24 hours ending 09/09/11 2026 General: Obese, Alert, awake, oriented x3, in no acute distress.  HEENT: [name]/AT PEERL, EOMI Neck: Trachea midline,  no masses, no thyromegal,y no JVD, no carotid bruit OROPHARYNX:  Moist, No exudate/ erythema/lesions.  Heart: Regular rate and rhythm, without murmurs, rubs, gallops, PMI non-displaced, no heaves or thrills on palpation.  Lungs: Clear to auscultation, no wheezing or rhonchi noted. No increased vocal fremitus resonant to percussion  Abdomen: Soft, nontender, nondistended, positive bowel sounds, no masses no hepatosplenomegaly noted..  Neuro: No focal  neurological deficits noted cranial nerves II through XII grossly intact. DTRs 2+ bilaterally upper and lower extremities. Strength 5 out of 5 in bilateral upper and lower extremities. Musculoskeletal: No warm swelling or erythema around joints, no spinal tenderness noted. Psychiatric: Patient alert and oriented x3, good insight and cognition, good recent to remote recall. Lymph node survey: No cervical axillary or inguinal lymphadenopathy noted.  Lab results:  [facility] 09/09/11 0748  NA 138  K 3.9  CL 99  CO2 27  GLUCOSE 160*  BUN 11  CREATININE 0.55  CALCIUM 9.3  MG --  PHOS --   No results found for this basename: AST:2,ALT:2,ALKPHOS:2,BILITOT:2,PROT:2,ALBUMIN:2 in the last 72 hours  Basename 09/09/11 0748  LIPASE 18  AMYLASE --    Basename 09/09/11 0748  WBC 11.7*  NEUTROABS 7.4  HGB 11.7*  HCT 34.9*  MCV 92.3  PLT 285    Basename 09/09/11 0748  CKTOTAL 30  CKMB 2.1  CKMBINDEX --  TROPONINI <0.30   No results found for this basename: POCBNP:3 in the last 72 hours  Basename 09/09/11 0748  DDIMER 0.27   No results found for this basename: HGBA1C:2 in the last 72 hours No results found for this basename: CHOL:2,HDL:2,LDLCALC:2,TRIG:2,CHOLHDL:2,LDLDIRECT:2 in the last 72 hours No results found for this basename: TSH,T4TOTAL,FREET3,T3FREE,THYROIDAB in the last 72 hours No results found for this basename: VITAMINB12:2,FOLATE:2,FERRITIN:2,TIBC:2,IRON:2,RETICCTPCT:2 in the last 72 hours Imaging results:  Dg Chest 2 View  09/09/2011  *RADIOLOGY REPORT*  Clinical Data: Chest pain.  CHEST - 2 VIEW  Comparison: 05/27/2011  Findings: Heart and mediastinal contours are within normal limits. No focal opacities or effusions.  No acute bony abnormality.  IMPRESSION: No active cardiopulmonary disease.  Original Report Authenticated By: Cyndie Chime, M.D.   Ct Angio Chest W/cm &/or Wo Cm  09/09/2011  *RADIOLOGY REPORT*  Clinical Data:  Left chest pain radiating into back.   CT ANGIOGRAPHY CHEST WITH CONTRAST  Technique:  Multidetector CT imaging of the chest was performed using the standard protocol during bolus administration of intravenous contrast.  Multiplanar CT image reconstructions including MIPs were obtained to evaluate the vascular anatomy.  Contrast: OMNIPAQUE IOHEXOL 350 MG/ML IV SOLN  Comparison:  05/27/2011  Findings:  Initial scan only included the aortic arch and part of the ascending and descending thoracic aorta before there was inadvertent stoppage of the CT scanner.  The patient was reinjected with contrast for a second scan.  The aorta is of normal caliber. No evidence of aortic dissection.  Central pulmonary arteries show normal patency.  There is no evidence of pulmonary edema, infiltrate, pneumothorax or pulmonary nodule.  No pleural or pericardial fluid.  No enlarged lymph nodes identified.  Focal calcified plaque present in the region of the LAD or diagonal branch.  Visualized liver shows diffuse fatty infiltration.  There are clips in the region of the GE junction from prior surgery.  Review of the MIP images  confirms the above findings.  IMPRESSION:  1.  No evidence of acute aortic dissection. 2.  No acute findings. 3.  Focal calcified plaque in the region of the LAD or a diagonal branch.  Original Report Authenticated By: Reola Calkins, M.D.   Other results: EKG: normal EKG, normal sinus rhythm, unchanged from previous tracings.   Patient Active Hospital Problem List: Chest pain at rest (09/09/2011)   Assessment: Patient has chest image I believe to be atypical and noncardiac. There is a consideration whether or not this patient may have an aortic dissection. However her hemodynamic presentation is not consistent with aortic dissection. Nevertheless given her risk I will go ahead and get a CT angiogram to further evaluate. The patient be followed by Dr. Nanetta Batty will hospitalize for further cardiac evaluation as he deems necessary.       Sarcoidosis of lung (04/10/2007)   Assessment: Stable    HYPERLIPIDEMIA (08/21/2006)   Assessment: Noted   HYPERTENSION (08/21/2006)   Assessment: Blood pressures controlled    GERD (08/21/2006)  continue PPIs    DIABETES MELLITUS, TYPE II (08/21/2006)   Assessment: In anticipation contrast study will D/C Glucophage and use sliding scale insulin.      Zorian Gunderman A. 09/09/2011, 8:26 PM

## 2011-09-09 NOTE — ED Notes (Signed)
Pt. Developed generalized chest pain yesterday while walking to her mothers.  The pain continued thru the night.  She describes the chest pain as sharp, non - radiaiting.  Pt. Having SOB on Exertion

## 2011-09-09 NOTE — ED Notes (Signed)
Returned from CT scan.

## 2011-09-09 NOTE — ED Notes (Signed)
The pt returned from c-t c/o some minor chest pain 3/10 food given

## 2011-09-09 NOTE — ED Provider Notes (Deleted)
Medical screening examination/treatment/procedure(s) were performed by non-physician practitioner and as supervising physician I was immediately available for consultation/collaboration.   Sharone Almond L Richmond Coldren, MD 09/09/11 1500 

## 2011-09-09 NOTE — ED Notes (Signed)
The zosyn has been added to iv to run over one hour

## 2011-09-09 NOTE — ED Notes (Signed)
The pt is waiting for c-t of the chest.  C/o being hungry.

## 2011-09-09 NOTE — ED Notes (Signed)
Received report from Lori, RN and Jordan, NT 

## 2011-09-09 NOTE — Progress Notes (Signed)
Pt complained of 5/10 chest pain when ambulating to the bathroom. 1 sl ntg given and relieved pain. bp 151/80 hr 99. Pt stated pain has gone away. Will continue to monitor.

## 2011-09-10 ENCOUNTER — Encounter (HOSPITAL_COMMUNITY)
Admission: EM | Disposition: A | Discharge status: Home / Self Care | Payer: Self-pay | Source: Home / Self Care | Attending: Internal Medicine

## 2011-09-10 LAB — CBC
HCT: 35.2 % — ABNORMAL LOW (ref 36.0–46.0)
Hemoglobin: 12 g/dL (ref 12.0–15.0)
MCH: 31.5 pg (ref 26.0–34.0)
MCHC: 34.1 g/dL (ref 30.0–36.0)
MCV: 92.4 fL (ref 78.0–100.0)
Platelets: 311 10*3/uL (ref 150–400)
RBC: 3.81 MIL/uL — ABNORMAL LOW (ref 3.87–5.11)
RDW: 12.5 % (ref 11.5–15.5)
WBC: 12.3 10*3/uL — ABNORMAL HIGH (ref 4.0–10.5)

## 2011-09-10 LAB — CARDIAC PANEL(CRET KIN+CKTOT+MB+TROPI)
CK, MB: 1.9 ng/mL (ref 0.3–4.0)
CK, MB: 1.8 ng/mL (ref 0.3–4.0)
CK, MB: 1.8 ng/mL (ref 0.3–4.0)
Relative Index: INVALID (ref 0.0–2.5)
Relative Index: INVALID (ref 0.0–2.5)
Relative Index: INVALID (ref 0.0–2.5)
Total CK: 37 U/L (ref 7–177)
Total CK: 27 U/L (ref 7–177)
Total CK: 30 U/L (ref 7–177)
Troponin I: <0.3 ng/mL (ref <0.30)
Troponin I: <0.3 ng/mL (ref <0.30)
Troponin I: <0.3 ng/mL (ref <0.30)

## 2011-09-10 LAB — GLUCOSE, CAPILLARY: Glucose-Capillary: 206 mg/dL — ABNORMAL HIGH (ref 70–99)

## 2011-09-10 LAB — HEPARIN LEVEL (UNFRACTIONATED)
Heparin Unfractionated: <0.1 IU/mL — ABNORMAL LOW (ref 0.30–0.70)
Heparin Unfractionated: <0.1 IU/mL — ABNORMAL LOW (ref 0.30–0.70)
Heparin Unfractionated: 0.11 IU/mL — ABNORMAL LOW (ref 0.30–0.70)

## 2011-09-10 SURGERY — LEFT HEART CATHETERIZATION WITH CORONARY ANGIOGRAM
Anesthesia: LOCAL

## 2011-09-10 MED ORDER — ASPIRIN EC 81 MG PO TBEC
81.0000 mg | DELAYED_RELEASE_TABLET | Freq: Every day | ORAL | Status: DC
  Administered 2011-09-10 – 2011-09-12 (×3): 81 mg via ORAL
  Filled 2011-09-10 (×3): qty 1

## 2011-09-10 MED ORDER — MORPHINE SULFATE 4 MG/ML IJ SOLN
4.0000 mg | INTRAMUSCULAR | Status: DC | PRN
  Administered 2011-09-10 – 2011-09-12 (×4): 4 mg via INTRAVENOUS
  Filled 2011-09-10 (×4): qty 1

## 2011-09-10 MED ORDER — INSULIN GLARGINE 100 UNIT/ML ~~LOC~~ SOLN
10.0000 [IU] | Freq: Every day | SUBCUTANEOUS | Status: DC
  Administered 2011-09-10 – 2011-09-11 (×2): 10 [IU] via SUBCUTANEOUS
  Filled 2011-09-10: qty 3

## 2011-09-10 MED ORDER — INSULIN ASPART 100 UNIT/ML ~~LOC~~ SOLN
0.0000 [IU] | Freq: Every day | SUBCUTANEOUS | Status: DC
  Administered 2011-09-10: 2 [IU] via SUBCUTANEOUS
  Filled 2011-09-10: qty 3

## 2011-09-10 MED ORDER — HEPARIN BOLUS VIA INFUSION
2000.0000 [IU] | Freq: Once | INTRAVENOUS | Status: AC
  Administered 2011-09-10: 2000 [IU] via INTRAVENOUS
  Filled 2011-09-10: qty 2000

## 2011-09-10 MED ORDER — INSULIN ASPART 100 UNIT/ML ~~LOC~~ SOLN
0.0000 [IU] | Freq: Three times a day (TID) | SUBCUTANEOUS | Status: DC
  Administered 2011-09-11: 2 [IU] via SUBCUTANEOUS
  Administered 2011-09-11: 5 [IU] via SUBCUTANEOUS
  Administered 2011-09-11: 3 [IU] via SUBCUTANEOUS
  Administered 2011-09-12: 15 [IU] via SUBCUTANEOUS
  Administered 2011-09-12 (×2): 2 [IU] via SUBCUTANEOUS
  Filled 2011-09-10: qty 3

## 2011-09-10 MED ORDER — CYCLOBENZAPRINE HCL 10 MG PO TABS
10.0000 mg | ORAL_TABLET | Freq: Three times a day (TID) | ORAL | Status: DC | PRN
  Filled 2011-09-10: qty 1

## 2011-09-10 NOTE — Progress Notes (Signed)
Subjective: CT angiograms it shows no dissection. There is no evidence of pulmonary embolus. Patient continues to have pain in the left anterior wall and in the posterior chest wall. Cardiology plans and recommendations noted. Objective: Filed Vitals:   09/09/11 2121 09/09/11 2226 09/10/11 0547 09/10/11 1400  BP: 151/80 142/80 114/76 123/86  Pulse: 99 89 71 73  Temp:   98.6 F (37 C) 98.4 F (36.9 C)  TempSrc:      Resp:   14 18  Height: 5\' 6"  (1.676 m)     Weight: 119.2 kg (262 lb 12.6 oz)     SpO2:   98% 98%   Weight change:   Intake/Output Summary (Last 24 hours) at 09/10/11 1754 Last data filed at 09/10/11 1500  Gross per 24 hour  Intake    254 ml  Output      0 ml  Net    254 ml    General: Obese, Alert, awake, oriented x3, in no acute distress.  HEENT: Orrum/AT PEERL, EOMI  Neck: Trachea midline, no masses, no thyromegal,y no JVD, no carotid bruit  OROPHARYNX: Moist, No exudate/ erythema/lesions.  Heart: Regular rate and rhythm, without murmurs, rubs, gallops, PMI non-displaced, no heaves or thrills on palpation.  Lungs: Clear to auscultation, no wheezing or rhonchi noted. No increased vocal fremitus resonant to percussion  Abdomen: Soft, nontender, nondistended, positive bowel sounds, no masses no hepatosplenomegaly noted..  Neuro: No focal neurological deficits noted cranial nerves II through XII grossly intact. DTRs 2+ bilaterally upper and lower extremities. Strength 5 out of 5 in bilateral upper and lower extremities.  Musculoskeletal: No warm swelling or erythema around joints, no spinal tenderness noted. Pain and spasm noted in the paraspinal and periscapular musculature. Psychiatric: Patient alert and oriented x3, good insight and cognition, good recent to remote recall.  Lymph node survey: No cervical axillary or inguinal lymphadenopathy noted.      Lab Results:  [facility] 09/09/11 0748  NA 138  K 3.9  CL 99  CO2 27  GLUCOSE 160*  BUN 11  CREATININE 0.55   CALCIUM 9.3  MG --  PHOS --   No results found for this basename: AST:2,ALT:2,ALKPHOS:2,BILITOT:2,PROT:2,ALBUMIN:2 in the last 72 hours  Basename 09/09/11 0748  LIPASE 18  AMYLASE --    Basename 09/10/11 0525 09/09/11 0748  WBC 12.3* 11.7*  NEUTROABS -- 7.4  HGB 12.0 11.7*  HCT 35.2* 34.9*  MCV 92.4 92.3  PLT 311 285    Basename 09/10/11 1524 09/10/11 0915 09/09/11 2323  CKTOTAL 30 27 37  CKMB 1.8 1.8 1.9  CKMBINDEX -- -- --  TROPONINI <0.30 <0.30 <0.30   No results found for this basename: POCBNP:3 in the last 72 hours  Basename 09/09/11 0748  DDIMER 0.27   No results found for this basename: HGBA1C:2 in the last 72 hours No results found for this basename: CHOL:2,HDL:2,LDLCALC:2,TRIG:2,CHOLHDL:2,LDLDIRECT:2 in the last 72 hours No results found for this basename: TSH,T4TOTAL,FREET3,T3FREE,THYROIDAB in the last 72 hours No results found for this basename: VITAMINB12:2,FOLATE:2,FERRITIN:2,TIBC:2,IRON:2,RETICCTPCT:2 in the last 72 hours  Micro Results: No results found for this or any previous visit (from the past 240 hour(s)).  Studies/Results: Dg Chest 2 View  09/09/2011  *RADIOLOGY REPORT*  Clinical Data: Chest pain.  CHEST - 2 VIEW  Comparison: 05/27/2011  Findings: Heart and mediastinal contours are within normal limits. No focal opacities or effusions.  No acute bony abnormality.  IMPRESSION: No active cardiopulmonary disease.  Original Report Authenticated By: Cyndie Chime, M.D.  Ct Angio Chest W/cm &/or Wo Cm  09/09/2011  *RADIOLOGY REPORT*  Clinical Data:  Left chest pain radiating into back.  CT ANGIOGRAPHY CHEST WITH CONTRAST  Technique:  Multidetector CT imaging of the chest was performed using the standard protocol during bolus administration of intravenous contrast.  Multiplanar CT image reconstructions including MIPs were obtained to evaluate the vascular anatomy.  Contrast: OMNIPAQUE IOHEXOL 350 MG/ML IV SOLN  Comparison:  05/27/2011  Findings:   Initial scan only included the aortic arch and part of the ascending and descending thoracic aorta before there was inadvertent stoppage of the CT scanner.  The patient was reinjected with contrast for a second scan.  The aorta is of normal caliber. No evidence of aortic dissection.  Central pulmonary arteries show normal patency.  There is no evidence of pulmonary edema, infiltrate, pneumothorax or pulmonary nodule.  No pleural or pericardial fluid.  No enlarged lymph nodes identified.  Focal calcified plaque present in the region of the LAD or diagonal branch.  Visualized liver shows diffuse fatty infiltration.  There are clips in the region of the GE junction from prior surgery.  Review of the MIP images confirms the above findings.  IMPRESSION:  1.  No evidence of acute aortic dissection. 2.  No acute findings. 3.  Focal calcified plaque in the region of the LAD or a diagonal branch.  Original Report Authenticated By: Reola Calkins, M.D.    Medications: I have reviewed the patient's current medications. Scheduled Meds:   . allopurinol  100 mg Oral Daily  . aspirin EC  81 mg Oral Daily  . atenolol  50 mg Oral Daily  . azithromycin  250 mg Oral Daily  . buPROPion  300 mg Oral Daily  . Cholecalciferol  2,000 Units Oral Daily  . cloNIDine  0.1 mg Oral Daily  . fenofibrate  160 mg Oral Daily  . furosemide  20 mg Oral Daily  . heparin  2,000 Units Intravenous Once  . heparin  4,000 Units Intravenous Once  . losartan  50 mg Oral Daily  . pantoprazole  40 mg Oral Q1200  . potassium chloride SA  20 mEq Oral Daily  . predniSONE  5 mg Oral Daily  . senna  1 tablet Oral BID  . venlafaxine  75 mg Oral Daily   Continuous Infusions:   . heparin 1,600 Units/hr (09/10/11 1334)   PRN Meds:.benzonatate, cyclobenzaprine, iohexol, LORazepam, morphine injection, nitroGLYCERIN, olopatadine, ondansetron (ZOFRAN) IV, ondansetron, polyethylene glycol, sodium phosphate, DISCONTD:  morphine injection,  DISCONTD: Olopatadine HCl Assessment/Plan: Patient Active Hospital Problem List: Chest pain at rest (09/09/2011)   Assessment: Low feel that this chest pain is noncardiac and related to musculoskeletal pathology. We'll increase the frequency of the morphine sulfate and Flexeril.    Sarcoidosis of lung (04/10/2007)   Assessment: Continue patient's home regimen of medications    HYPERLIPIDEMIA (08/21/2006)   Assessment: Continue anti-lipidemia medications    HYPERTENSION (08/21/2006)   Assessment: Blood pressure well-controlled    GERD (08/21/2006)   Assessment: No active complaint    DIABETES MELLITUS, TYPE II (08/21/2006)   Assessment: We'll treat with insulin. Continue to hold metformin since patient had a dye study done in the last 24 hours.       LOS: 1 day

## 2011-09-10 NOTE — Consult Note (Signed)
ANTICOAGULATION CONSULT NOTE - Follow Up Consult  Pharmacy Consult for Heparin Indication: USAP  Allergies  Allergen Reactions  . Lisinopril Cough  . Methotrexate     REACTION: peri-oral and buccal lesions.    Patient Measurements: Height: 5\' 6"  (167.6 cm) Weight: 262 lb 12.6 oz (119.2 kg) IBW/kg (Calculated) : 59.3    Vital Signs: Temp: 98.1 F (36.7 C) (11/30 1810) Temp src: Oral (11/30 1810) BP: 119/78 mmHg (11/30 1810) Pulse Rate: 70  (11/30 1810)  Labs:  Basename 09/10/11 1926 09/10/11 1524 09/10/11 1210 09/10/11 0915 09/10/11 0525 09/09/11 2323 09/09/11 0748  HGB -- -- -- -- 12.0 -- 11.7*  HCT -- -- -- -- 35.2* -- 34.9*  PLT -- -- -- -- 311 -- 285  APTT -- -- -- -- -- -- 28  LABPROT -- -- -- -- -- -- 13.3  INR -- -- -- -- -- -- 0.99  HEPARINUNFRC 0.11* -- <0.10* -- <0.10* -- --  CREATININE -- -- -- -- -- -- 0.55  CKTOTAL -- 30 -- 27 -- 37 --  CKMB -- 1.8 -- 1.8 -- 1.9 --  TROPONINI -- <0.30 -- <0.30 -- <0.30 --   Estimated Creatinine Clearance: 108.2 ml/min (by C-G formula based on Cr of 0.55).   Medications:  Scheduled:    . allopurinol  100 mg Oral Daily  . aspirin EC  81 mg Oral Daily  . atenolol  50 mg Oral Daily  . azithromycin  250 mg Oral Daily  . buPROPion  300 mg Oral Daily  . Cholecalciferol  2,000 Units Oral Daily  . cloNIDine  0.1 mg Oral Daily  . fenofibrate  160 mg Oral Daily  . furosemide  20 mg Oral Daily  . heparin  2,000 Units Intravenous Once  . heparin  2,000 Units Intravenous Once  . heparin  4,000 Units Intravenous Once  . insulin aspart  0-15 Units Subcutaneous TID WC  . insulin aspart  0-5 Units Subcutaneous QHS  . insulin glargine  10 Units Subcutaneous QHS  . losartan  50 mg Oral Daily  . pantoprazole  40 mg Oral Q1200  . potassium chloride SA  20 mEq Oral Daily  . predniSONE  5 mg Oral Daily  . senna  1 tablet Oral BID  . venlafaxine  75 mg Oral Daily   Infusions:    . heparin 1,600 Units/hr (09/10/11 1334)     Assessment: Patient receiving Heparin via infusion for USAP.  Heparin level remains subtherapeutic despite rate increases.  Goal of Therapy:  Heparin level  0.3 - 0.7   Plan:  Heparin 2000 unit bolus. Increase Heparin to 1950 units/hr. Next Heparin level in 6 hours.  Jaclynn Laumann, Elisha Headland, Pharm.D. 09/10/2011 8:24 PM

## 2011-09-10 NOTE — Progress Notes (Signed)
   CARE MANAGEMENT NOTE 09/10/2011  Patient:  [name], [name]   Account Number:  [id number]  Date Initiated:  09/10/2011  Documentation initiated by:  Onnie Boer  Subjective/Objective Assessment:   PT WAS ADMITTED WITH CP.     Action/Plan:   PROGRESSION OF CARE AND DISCAHRGE PLANNING   Anticipated DC Date:  09/13/2011   Anticipated DC Plan:  HOME/SELF CARE      DC Planning Services  CM consult      Choice offered to / List presented to:             Status of service:  In process, will continue to follow Medicare Important Message given?   (If response is "NO", the following Medicare IM given date fields will be blank) Date Medicare IM given:   Date Additional Medicare IM given:    Discharge Disposition:    Per UR Regulation:  Reviewed for med. necessity/level of care/duration of stay  Comments:  UR COMPLETED 09/10/2011 Quill Grinder,RN, BSN 1158 PT WAS ADMITTED WITH CP.  PTA PT WAS AT HOME WITH SELF CARE.  PT MAY NEED A CATH, WILL F/U.

## 2011-09-10 NOTE — Progress Notes (Signed)
ANTICOAGULATION CONSULT NOTE - Follow Up Consult  Pharmacy Consult for Heparin Indication: chest pain/ACS  Allergies  Allergen Reactions  . Lisinopril Cough  . Methotrexate     REACTION: peri-oral and buccal lesions.    Patient Measurements: Height: 5\' 6"  (167.6 cm) Weight: 262 lb 12.6 oz (119.2 kg) IBW/kg (Calculated) : 59.3    Vital Signs: Temp: 98.6 F (37 C) (11/30 0547) BP: 114/76 mmHg (11/30 0547) Pulse Rate: 71  (11/30 0547)  Labs:  Basename 09/10/11 1210 09/10/11 0915 09/10/11 0525 09/09/11 2323 09/09/11 0748  HGB -- -- 12.0 -- 11.7*  HCT -- -- 35.2* -- 34.9*  PLT -- -- 311 -- 285  APTT -- -- -- -- 28  LABPROT -- -- -- -- 13.3  INR -- -- -- -- 0.99  HEPARINUNFRC <0.10* -- <0.10* -- --  CREATININE -- -- -- -- 0.55  CKTOTAL -- 27 -- 37 30  CKMB -- 1.8 -- 1.9 2.1  TROPONINI -- <0.30 -- <0.30 <0.30   Estimated Creatinine Clearance: 108.2 ml/min (by C-G formula based on Cr of 0.55).   Medications:  Scheduled:    . allopurinol  100 mg Oral Daily  . atenolol  50 mg Oral Daily  . azithromycin  250 mg Oral Daily  . buPROPion  300 mg Oral Daily  . Cholecalciferol  2,000 Units Oral Daily  . cloNIDine  0.1 mg Oral Daily  . fenofibrate  160 mg Oral Daily  . furosemide  20 mg Oral Daily  . gi cocktail  30 mL Oral Once  . heparin  4,000 Units Intravenous Once  . losartan  50 mg Oral Daily  . pantoprazole  40 mg Oral Q1200  . potassium chloride SA  20 mEq Oral Daily  . predniSONE  5 mg Oral Daily  . senna  1 tablet Oral BID  . venlafaxine  75 mg Oral Daily    Assessment: 50 y/o female patient admitted with chest pain, cardiac enzymes negative, requiring anticoagulation . No bleeding reported, no plans for cath. Heparin level subtherapeutic, will bolus and increase rate..  Goal of Therapy:  Heparin level 0.3-0.7 units/ml   Plan:  Heparin 2000 unit IV bolus then increase gtt rate to 1600 units/hr. Check 6 hour heparin level. No ASA ordered, will start 81mg   daily.  Verlene Mayer, PharmD, BCPS Pager [phone]  09/10/2011,1:02 PM

## 2011-09-10 NOTE — Progress Notes (Signed)
Pharmacy Consult for :Heparin  Pt initiated on heparin last PM for chest pain/ACS.  AM level was undectable.  The nurse informed me that the patient lost their IV access from ~ 0400 to 0445 prior to the 0530 lab draw.  IV has been replaced and heparin restarted.  Will not change rate at this time.  Recheck heparin level at noon.  [employer], Pharm.D., BCPS Clinical Pharmacist Pager [phone]

## 2011-09-10 NOTE — Progress Notes (Signed)
Inpatient Diabetes Program Recommendations  AACE/ADA: New Consensus Statement on Inpatient Glycemic Control (2009)  Target Ranges:  Prepandial:   less than 140 mg/dL      Peak postprandial:   less than 180 mg/dL (1-2 hours)      Critically ill patients:  140 - 180 mg/dL   Reason for Visit: CBG 207 last p.m.  Inpatient Diabetes Program Recommendations Correction (SSI): Start moderate correction TID

## 2011-09-10 NOTE — Progress Notes (Signed)
Patient ID: [name], female   DOB: [birth year], 50 y.o.   MRN: [id number]   [facility]  Subjective: Still with 7/10, "sharp" CP radiating to her back.  Objective: Vital signs in last 24 hours: Temp:  [97.7 F (36.5 C)-98.6 F (37 C)] 98.6 F (37 C) (11/30 0547) Pulse Rate:  [71-114] 71  (11/30 0547) Resp:  [14-22] 14  (11/30 0547) BP: (114-158)/(47-81) 114/76 mmHg (11/30 0547) SpO2:  [96 %-100 %] 98 % (11/30 0547) Weight:  [119.2 kg (262 lb 12.6 oz)] 262 lb 12.6 oz (119.2 kg) (11/29 2121) Last BM Date: 09/08/11  Intake/Output from previous day:   Intake/Output this shift:    Medications Current Facility-Administered Medications  Medication Dose Route Frequency Provider Last Rate Last Dose  . allopurinol (ZYLOPRIM) tablet 100 mg  100 mg Oral Daily Michelle A. Matthews   100 mg at 09/09/11 2229  . atenolol (TENORMIN) tablet 50 mg  50 mg Oral Daily Michelle A. Matthews   50 mg at 09/09/11 2228  . azithromycin (ZITHROMAX) tablet 250 mg  250 mg Oral Daily Michelle A. Matthews   250 mg at 09/10/11 0039  . benzonatate (TESSALON) capsule 100 mg  100 mg Oral TID PRN Michelle A. Ashley Royalty      . buPROPion (WELLBUTRIN XL) 24 hr tablet 300 mg  300 mg Oral Daily Michelle A. Matthews   300 mg at 09/09/11 2232  . Cholecalciferol 2,000 Units  2,000 Units Oral Daily Michelle A. Ashley Royalty      . cloNIDine (CATAPRES) tablet 0.1 mg  0.1 mg Oral Daily Michelle A. Matthews   0.1 mg at 09/09/11 2228  . fenofibrate tablet 160 mg  160 mg Oral Daily Michelle A. Matthews   160 mg at 09/09/11 2228  . furosemide (LASIX) tablet 20 mg  20 mg Oral Daily Michelle A. Matthews   20 mg at 09/09/11 2227  . gi cocktail  30 mL Oral Once Leone Brand, NP   30 mL at 09/09/11 1504  . heparin 100 units/mL bolus via infusion 4,000 Units  4,000 Units Intravenous Once Fredrik Rigger, PHARMD   4,000 Units at 09/10/11 0039  . heparin ADULT infusion 100 units/ml (25000 units/250 ml)   1,250 Units/hr Intravenous Continuous Fredrik Rigger, PHARMD 12.5 mL/hr at 09/10/11 0039 1,250 Units/hr at 09/10/11 0039  . HYDROmorphone (DILAUDID) 1 MG/ML injection        1 mg at 09/09/11 0930  . HYDROmorphone (DILAUDID) injection 1 mg  1 mg Intravenous Once Na Li, MD   1 mg at 09/09/11 1136  . iohexol (OMNIPAQUE) 350 MG/ML injection 150 mL  150 mL Intravenous Once PRN Medication Radiologist   150 mL at 09/09/11 1852  . LORazepam (ATIVAN) tablet 1 mg  1 mg Oral BID PRN Michelle A. Ashley Royalty      . losartan (COZAAR) tablet 50 mg  50 mg Oral Daily Michelle A. Matthews   50 mg at 09/09/11 2227  . morphine 4 MG/ML injection 4 mg  4 mg Intravenous Q4H PRN Michelle A. Matthews   4 mg at 09/10/11 0344  . nitroGLYCERIN (NITROSTAT) SL tablet 0.4 mg  0.4 mg Sublingual Once Na Li, MD   0.4 mg at 09/09/11 1137  . nitroGLYCERIN (NITROSTAT) SL tablet 0.4 mg  0.4 mg Sublingual Q5 min PRN Michelle A. Matthews   0.4 mg at 09/09/11 2118  . olopatadine (PATANOL) 0.1 % ophthalmic solution 1 drop  1 drop Both Eyes BID PRN  Garth Bigness Frens, PHARMD      . ondansetron [facility]) tablet 4 mg  4 mg Oral Q6H PRN Michelle A. Matthews       Or  . ondansetron (ZOFRAN) injection 4 mg  4 mg Intravenous Q6H PRN Michelle A. Ashley Royalty      . pantoprazole (PROTONIX) EC tablet 40 mg  40 mg Oral Q1200 Michelle A. Matthews   40 mg at 09/09/11 2227  . polyethylene glycol (MIRALAX / GLYCOLAX) packet 17 g  17 g Oral Daily PRN Michelle A. Ashley Royalty      . potassium chloride SA (K-DUR,KLOR-CON) CR tablet 20 mEq  20 mEq Oral Daily Michelle A. Matthews   20 mEq at 09/09/11 2226  . predniSONE (DELTASONE) tablet 5 mg  5 mg Oral Daily Michelle A. Matthews   5 mg at 09/09/11 2227  . senna (SENOKOT) tablet 8.6 mg  1 tablet Oral BID Michelle A. Matthews   8.6 mg at 09/09/11 2229  . sodium phosphate (FLEET) 7-19 GM/118ML enema 1 enema  1 enema Rectal Once PRN Michelle A. Ashley Royalty      . venlafaxine (EFFEXOR-XR) 24 hr capsule 75 mg  75 mg Oral  Daily Michelle A. Matthews   75 mg at 09/09/11 2227  . DISCONTD: Olopatadine HCl 0.2 % SOLN 1 drop  1 drop Both Eyes Daily PRN Michelle A. Ashley Royalty        PE: General appearance: alert, cooperative and no distress Lungs: clear to auscultation bilaterally Heart: regular rate and rhythm, S1, S2 normal, no murmur, click, rub or gallop Extremities: No Edema Pulses: 2+ radials, 1+ right DP, 2+ left DP.  Lab Results:   [facility] 09/10/11 0525 09/09/11 0748  WBC 12.3* 11.7*  HGB 12.0 11.7*  HCT 35.2* 34.9*  PLT 311 285   BMET  Basename 09/09/11 0748  NA 138  K 3.9  CL 99  CO2 27  GLUCOSE 160*  BUN 11  CREATININE 0.55  CALCIUM 9.3   PT/INR  Basename 09/09/11 0748  LABPROT 13.3  INR 0.99   Cholesterol No results found for this basename: CHOL in the last 72 hours Cardiac Enzymes No components found with this basename: TROPONIN:3, CKMB:3  Studies/Results: CT ANGIOGRAPHY CHEST WITH CONTRAST  Findings: Initial scan only included the aortic arch and part of  the ascending and descending thoracic aorta before there was  inadvertent stoppage of the CT scanner. The patient was reinjected  with contrast for a second scan. The aorta is of normal caliber.  No evidence of aortic dissection. Central pulmonary arteries show  normal patency. There is no evidence of pulmonary edema,  infiltrate, pneumothorax or pulmonary nodule. No pleural or  pericardial fluid.  No enlarged lymph nodes identified. Focal calcified plaque present  in the region of the LAD or diagonal branch. Visualized liver  shows diffuse fatty infiltration. There are clips in the region of  the GE junction from prior surgery.  Review of the MIP images confirms the above findings.   IMPRESSION:  1. No evidence of acute aortic dissection.  2. No acute findings.  3. Focal calcified plaque in the region of the LAD or a diagonal  branch.  Assessment/Plan  Principal Problem:  *Chest pain at rest Active  Problems:  Sarcoidosis of lung  HYPERLIPIDEMIA  HYPERTENSION  GERD  DIABETES MELLITUS, TYPE II  Plan: Cardiac enzymes negative times 2.  BP and HR controlled and stable.  The patient reports that nitro in the ED eased the pain. Calcified plaque in  the region of LAD/Diag on CTA.  No acute EKG changes.  Cath from OCT 2011: Normal Coronaries.  EF50-55%, very mild global hypokinesis. May need left heart cath.  Agree with above. Card cath 10-11 no cad. Cardiac markers -.   Will check 2D Echo. Can not justify cath unless wall motion changes or ekg changes.   LOS: 1 day    HAGER,BRYAN W 09/10/2011 9:03 AM  LITTLE, ALFRED B 10:21 AM

## 2011-09-11 ENCOUNTER — Other Ambulatory Visit: Payer: Self-pay

## 2011-09-11 LAB — HEPARIN LEVEL (UNFRACTIONATED)
Heparin Unfractionated: 0.24 IU/mL — ABNORMAL LOW (ref 0.30–0.70)
Heparin Unfractionated: 0.24 IU/mL — ABNORMAL LOW (ref 0.30–0.70)

## 2011-09-11 LAB — HEMOGLOBIN A1C
Hgb A1c MFr Bld: 7.5 % — ABNORMAL HIGH (ref <5.7)
Mean Plasma Glucose: 169 mg/dL — ABNORMAL HIGH (ref <117)

## 2011-09-11 LAB — GLUCOSE, CAPILLARY
Glucose-Capillary: 142 mg/dL — ABNORMAL HIGH (ref 70–99)
Glucose-Capillary: 190 mg/dL — ABNORMAL HIGH (ref 70–99)
Glucose-Capillary: 236 mg/dL — ABNORMAL HIGH (ref 70–99)
Glucose-Capillary: 199 mg/dL — ABNORMAL HIGH (ref 70–99)

## 2011-09-11 LAB — CBC
HCT: 34 % — ABNORMAL LOW (ref 36.0–46.0)
Hemoglobin: 10.8 g/dL — ABNORMAL LOW (ref 12.0–15.0)
MCH: 29.8 pg (ref 26.0–34.0)
MCHC: 31.8 g/dL (ref 30.0–36.0)
MCV: 93.7 fL (ref 78.0–100.0)
Platelets: 287 10*3/uL (ref 150–400)
RBC: 3.63 MIL/uL — ABNORMAL LOW (ref 3.87–5.11)
RDW: 12.6 % (ref 11.5–15.5)
WBC: 11 10*3/uL — ABNORMAL HIGH (ref 4.0–10.5)

## 2011-09-11 MED ORDER — ACETAMINOPHEN 325 MG PO TABS
650.0000 mg | ORAL_TABLET | ORAL | Status: DC | PRN
  Administered 2011-09-11: 650 mg via ORAL
  Filled 2011-09-11: qty 2

## 2011-09-11 MED ORDER — HEPARIN BOLUS VIA INFUSION
2000.0000 [IU] | Freq: Once | INTRAVENOUS | Status: AC
  Administered 2011-09-11: 2000 [IU] via INTRAVENOUS
  Filled 2011-09-11: qty 2000

## 2011-09-11 NOTE — Progress Notes (Signed)
  Echocardiogram 2D Echocardiogram has been performed.  [name], Real Cons 09/11/2011, 10:15 AM

## 2011-09-11 NOTE — Progress Notes (Signed)
Called to patient's room @ 1700 d/t pt losing IV access.  Pt on IV heparin.  Total duration of IV heparin being off was 52 minutes.  Pharmacy notified of time off heparin.

## 2011-09-11 NOTE — Progress Notes (Signed)
[facility] Progress Note  Subjective:  Pt still complains of cp radiateng to back, described as sharp and intermittent.  Objective:   Vital Signs in the last 24 hours: Temp:  [98 F (36.7 C)-98.4 F (36.9 C)] 98.1 F (36.7 C) (12/01 0552) Pulse Rate:  [67-76] 76  (12/01 0552) Resp:  [16-20] 18  (12/01 0552) BP: (119-145)/(78-86) 125/81 mmHg (12/01 0552) SpO2:  [93 %-98 %] 93 % (12/01 0552)  Intake/Output from previous day: 11/30 0701 - 12/01 0700 In: 254 [P.O.:254] Out: -   Scheduled:   . allopurinol  100 mg Oral Daily  . aspirin EC  81 mg Oral Daily  . atenolol  50 mg Oral Daily  . azithromycin  250 mg Oral Daily  . buPROPion  300 mg Oral Daily  . Cholecalciferol  2,000 Units Oral Daily  . cloNIDine  0.1 mg Oral Daily  . fenofibrate  160 mg Oral Daily  . furosemide  20 mg Oral Daily  . heparin  2,000 Units Intravenous Once  . heparin  2,000 Units Intravenous Once  . insulin aspart  0-15 Units Subcutaneous TID WC  . insulin aspart  0-5 Units Subcutaneous QHS  . insulin glargine  10 Units Subcutaneous QHS  . losartan  50 mg Oral Daily  . pantoprazole  40 mg Oral Q1200  . potassium chloride SA  20 mEq Oral Daily  . predniSONE  5 mg Oral Daily  . senna  1 tablet Oral BID  . venlafaxine  75 mg Oral Daily   Continuous:   . heparin 2,200 Units/hr (09/11/11 0442)   ZOX:[id number], cyclobenzaprine, LORazepam, morphine injection, nitroGLYCERIN, olopatadine, ondansetron (ZOFRAN) IV, ondansetron, polyethylene glycol, DISCONTD:  morphine injection  Physical Exam:   General appearance: alert, cooperative, appears stated age and no distress Neck: no adenopathy, no carotid bruit, no JVD, supple, symmetrical, trachea midline and thyroid not enlarged, symmetric, no tenderness/mass/nodules Lungs: clear to auscultation bilaterally Heart: regular rate and rhythm, S1, S2 normal, no murmur, click, rub or gallop.  No chest wall tenderness to  palpation. Abdomen: soft, non-tender; bowel sounds normal; no masses,  no organomegaly Extremities: extremities normal, atraumatic, no cyanosis or edema   Rate: 70  Rhythm: normal sinus rhythm  Lab Results: Wbc 11.0  H10.8 hct34  Basename 09/09/11 0748  NA 138  K 3.9  CL 99  CO2 27  GLUCOSE 160*  BUN 11  CREATININE 0.55    Basename 09/10/11 1524 09/10/11 0915  TROPONINI <0.30 <0.30   Hepatic Function Panel No results found for this basename: PROT,ALBUMIN,AST,ALT,ALKPHOS,BILITOT,BILIDIR,IBILI in the last 72 hours Cholesterol, Triglycerides, LDL, HDL Basename 09/09/11 0748  INR 0.99      Imaging:  Dg Chest 2 View  09/09/2011  *RADIOLOGY REPORT*  Clinical Data: Chest pain.  CHEST - 2 VIEW  Comparison: 05/27/2011  Findings: Heart and mediastinal contours are within normal limits. No focal opacities or effusions.  No acute bony abnormality.  IMPRESSION: No active cardiopulmonary disease.  Original Report Authenticated By: Cyndie Chime, M.D.   Ct Angio Chest W/cm &/or Wo Cm  09/09/2011  *RADIOLOGY REPORT*  Clinical Data:  Left chest pain radiating into back.  CT ANGIOGRAPHY CHEST WITH CONTRAST  Technique:  Multidetector CT imaging of the chest was performed using the standard protocol during bolus administration of intravenous contrast.  Multiplanar CT image reconstructions including MIPs were obtained to evaluate the vascular anatomy.  Contrast: OMNIPAQUE IOHEXOL 350 MG/ML IV SOLN  Comparison:  05/27/2011  Findings:  Initial scan only included the aortic arch and part of the ascending and descending thoracic aorta before there was inadvertent stoppage of the CT scanner.  The patient was reinjected with contrast for a second scan.  The aorta is of normal caliber. No evidence of aortic dissection.  Central pulmonary arteries show normal patency.  There is no evidence of pulmonary edema, infiltrate, pneumothorax or pulmonary nodule.  No pleural or pericardial fluid.  No  enlarged lymph nodes identified.  Focal calcified plaque present in the region of the LAD or diagonal branch.  Visualized liver shows diffuse fatty infiltration.  There are clips in the region of the GE junction from prior surgery.  Review of the MIP images confirms the above findings.  IMPRESSION:  1.  No evidence of acute aortic dissection. 2.  No acute findings. 3.  Focal calcified plaque in the region of the LAD or a diagonal branch.  Original Report Authenticated By: Reola Calkins, M.D.    Cardiac Studies:  2D Echocardiogram  Still not done    Assessment/Plan:   Principal Problem:  *Chest pain at rest Active Problems:  Sarcoidosis of lung  HYPERLIPIDEMIA  HYPERTENSION  GERD  DIABETES MELLITUS, TYPE II  Doubt ischemic chest pain.  Had cath 07/2010 with nl coronaries.  For echo today.  CT neg for dissection. Coronary plaque noted on CT.  Consider myoview.    Lennette Bihari, MD, [facility] 09/11/2011, 8:49 AM

## 2011-09-11 NOTE — Progress Notes (Signed)
Subjective: . Patient has continued to have anterior chest wall pain which is mediated by the pain and sometimes by nitroglycerin. Echo results pending   Objective: Filed Vitals:   09/10/11 1810 09/10/11 2153 09/11/11 0552 09/11/11 1337  BP: 119/78 145/84 125/81 132/81  Pulse: 70 67 76 72  Temp: 98.1 F (36.7 C) 98 F (36.7 C) 98.1 F (36.7 C) 98 F (36.7 C)  TempSrc: Oral Oral Oral Oral  Resp: 16 20 18 18   Height:      Weight:      SpO2: 98% 97% 93% 97%   Weight change:   Intake/Output Summary (Last 24 hours) at 09/11/11 1940 Last data filed at 09/11/11 1300  Gross per 24 hour  Intake    480 ml  Output      0 ml  Net    480 ml    General: Obese, Alert, awake, oriented x3, in no acute distress.  HEENT: [place]/AT PEERL, EOMI  Neck: Trachea midline, no masses, no thyromegal,y no JVD, no carotid bruit  OROPHARYNX: Moist, No exudate/ erythema/lesions.  Heart: Regular rate and rhythm, without murmurs, rubs, gallops, PMI non-displaced, no heaves or thrills on palpation.  Lungs: Clear to auscultation, no wheezing or rhonchi noted. No increased vocal fremitus resonant to percussion  Abdomen: Soft, nontender, nondistended, positive bowel sounds, no masses no hepatosplenomegaly noted..  Neuro: No focal neurological deficits noted cranial nerves II through XII grossly intact. DTRs 2+ bilaterally upper and lower extremities. Strength 5 out of 5 in bilateral upper and lower extremities.  Musculoskeletal: No warm swelling or erythema around joints, no spinal tenderness noted. Pain and spasm noted in the paraspinal and periscapular musculature. Psychiatric: Patient alert and oriented x3, good insight and cognition, good recent to remote recall.  Lymph node survey: No cervical axillary or inguinal lymphadenopathy noted.      Lab Results:  [facility] 09/09/11 0748  NA 138  K 3.9  CL 99  CO2 27  GLUCOSE 160*  BUN 11  CREATININE 0.55  CALCIUM 9.3  MG --  PHOS --   No results found  for this basename: AST:2,ALT:2,ALKPHOS:2,BILITOT:2,PROT:2,ALBUMIN:2 in the last 72 hours  Basename 09/09/11 0748  LIPASE 18  AMYLASE --    Basename 09/11/11 0530 09/10/11 0525 09/09/11 0748  WBC 11.0* 12.3* --  NEUTROABS -- -- 7.4  HGB 10.8* 12.0 --  HCT 34.0* 35.2* --  MCV 93.7 92.4 --  PLT 287 311 --    Basename 09/10/11 1524 09/10/11 0915 09/09/11 2323  CKTOTAL 30 27 37  CKMB 1.8 1.8 1.9  CKMBINDEX -- -- --  TROPONINI <0.30 <0.30 <0.30   No results found for this basename: POCBNP:3 in the last 72 hours  Basename 09/09/11 0748  DDIMER 0.27    Basename 09/10/11 1926  HGBA1C 7.5*   No results found for this basename: CHOL:2,HDL:2,LDLCALC:2,TRIG:2,CHOLHDL:2,LDLDIRECT:2 in the last 72 hours No results found for this basename: TSH,T4TOTAL,FREET3,T3FREE,THYROIDAB in the last 72 hours No results found for this basename: VITAMINB12:2,FOLATE:2,FERRITIN:2,TIBC:2,IRON:2,RETICCTPCT:2 in the last 72 hours  Micro Results: No results found for this or any previous visit (from the past 240 hour(s)).  Studies/Results: Dg Chest 2 View  09/09/2011  *RADIOLOGY REPORT*  Clinical Data: Chest pain.  CHEST - 2 VIEW  Comparison: 05/27/2011  Findings: Heart and mediastinal contours are within normal limits. No focal opacities or effusions.  No acute bony abnormality.  IMPRESSION: No active cardiopulmonary disease.  Original Report Authenticated By: Cyndie Chime, M.D.   Ct Angio Chest W/cm &/or  Wo Cm  09/09/2011  *RADIOLOGY REPORT*  Clinical Data:  Left chest pain radiating into back.  CT ANGIOGRAPHY CHEST WITH CONTRAST  Technique:  Multidetector CT imaging of the chest was performed using the standard protocol during bolus administration of intravenous contrast.  Multiplanar CT image reconstructions including MIPs were obtained to evaluate the vascular anatomy.  Contrast: OMNIPAQUE IOHEXOL 350 MG/ML IV SOLN  Comparison:  05/27/2011  Findings:  Initial scan only included the aortic arch  and part of the ascending and descending thoracic aorta before there was inadvertent stoppage of the CT scanner.  The patient was reinjected with contrast for a second scan.  The aorta is of normal caliber. No evidence of aortic dissection.  Central pulmonary arteries show normal patency.  There is no evidence of pulmonary edema, infiltrate, pneumothorax or pulmonary nodule.  No pleural or pericardial fluid.  No enlarged lymph nodes identified.  Focal calcified plaque present in the region of the LAD or diagonal branch.  Visualized liver shows diffuse fatty infiltration.  There are clips in the region of the GE junction from prior surgery.  Review of the MIP images confirms the above findings.  IMPRESSION:  1.  No evidence of acute aortic dissection. 2.  No acute findings. 3.  Focal calcified plaque in the region of the LAD or a diagonal branch.  Original Report Authenticated By: Reola Calkins, M.D.    Medications: I have reviewed the patient's current medications. Scheduled Meds:    . allopurinol  100 mg Oral Daily  . aspirin EC  81 mg Oral Daily  . atenolol  50 mg Oral Daily  . azithromycin  250 mg Oral Daily  . buPROPion  300 mg Oral Daily  . Cholecalciferol  2,000 Units Oral Daily  . cloNIDine  0.1 mg Oral Daily  . fenofibrate  160 mg Oral Daily  . furosemide  20 mg Oral Daily  . heparin  2,000 Units Intravenous Once  . heparin  2,000 Units Intravenous Once  . insulin aspart  0-15 Units Subcutaneous TID WC  . insulin aspart  0-5 Units Subcutaneous QHS  . insulin glargine  10 Units Subcutaneous QHS  . losartan  50 mg Oral Daily  . pantoprazole  40 mg Oral Q1200  . potassium chloride SA  20 mEq Oral Daily  . predniSONE  5 mg Oral Daily  . senna  1 tablet Oral BID  . venlafaxine  75 mg Oral Daily   Continuous Infusions:    . heparin 2,500 Units/hr (09/11/11 1435)   PRN Meds:.acetaminophen, benzonatate, cyclobenzaprine, LORazepam, morphine injection, nitroGLYCERIN, olopatadine,  ondansetron (ZOFRAN) IV, ondansetron, polyethylene glycol Assessment/Plan: Patient Active Hospital Problem List: Chest pain at rest (09/09/2011)   Assessment: Low feel that this chest pain is noncardiac and related to musculoskeletal pathology. Continue the present frequency of the morphine sulfate and Flexeril.    Sarcoidosis of lung (04/10/2007)   Assessment: Continue patient's home regimen of medications    HYPERLIPIDEMIA (08/21/2006)   Assessment: Continue anti-lipidemia medications    HYPERTENSION (08/21/2006)   Assessment: Blood pressure well-controlled    GERD (08/21/2006)   Assessment: No active complaint    DIABETES MELLITUS, TYPE II (08/21/2006)   Assessment: We'll treat with insulin. Continue to hold metformin since patient had a dye study done in the last 24 hours.       LOS: 2 days

## 2011-09-11 NOTE — Consult Note (Signed)
ANTICOAGULATION CONSULT NOTE - Follow Up Consult  Pharmacy Consult for Heparin Indication: USAP  Allergies  Allergen Reactions  . Lisinopril Cough  . Methotrexate     REACTION: peri-oral and buccal lesions.    Patient Measurements: Height: 5\' 6"  (167.6 cm) Weight: 262 lb 12.6 oz (119.2 kg) IBW/kg (Calculated) : 59.3    Vital Signs: Temp: 98 F (36.7 C) (11/30 2153) Temp src: Oral (11/30 2153) BP: 145/84 mmHg (11/30 2153) Pulse Rate: 67  (11/30 2153)  Labs:  Alvira Philips 09/11/11 0233 09/10/11 1926 09/10/11 1524 09/10/11 1210 09/10/11 0915 09/10/11 0525 09/09/11 2323 09/09/11 0748  HGB -- -- -- -- -- 12.0 -- 11.7*  HCT -- -- -- -- -- 35.2* -- 34.9*  PLT -- -- -- -- -- 311 -- 285  APTT -- -- -- -- -- -- -- 28  LABPROT -- -- -- -- -- -- -- 13.3  INR -- -- -- -- -- -- -- 0.99  HEPARINUNFRC 0.24* 0.11* -- <0.10* -- -- -- --  CREATININE -- -- -- -- -- -- -- 0.55  CKTOTAL -- -- 30 -- 27 -- 37 --  CKMB -- -- 1.8 -- 1.8 -- 1.9 --  TROPONINI -- -- <0.30 -- <0.30 -- <0.30 --   Estimated Creatinine Clearance: 108.2 ml/min (by C-G formula based on Cr of 0.55).   Medications:  Scheduled:     . allopurinol  100 mg Oral Daily  . aspirin EC  81 mg Oral Daily  . atenolol  50 mg Oral Daily  . azithromycin  250 mg Oral Daily  . buPROPion  300 mg Oral Daily  . Cholecalciferol  2,000 Units Oral Daily  . cloNIDine  0.1 mg Oral Daily  . fenofibrate  160 mg Oral Daily  . furosemide  20 mg Oral Daily  . heparin  2,000 Units Intravenous Once  . heparin  2,000 Units Intravenous Once  . insulin aspart  0-15 Units Subcutaneous TID WC  . insulin aspart  0-5 Units Subcutaneous QHS  . insulin glargine  10 Units Subcutaneous QHS  . losartan  50 mg Oral Daily  . pantoprazole  40 mg Oral Q1200  . potassium chloride SA  20 mEq Oral Daily  . predniSONE  5 mg Oral Daily  . senna  1 tablet Oral BID  . venlafaxine  75 mg Oral Daily   Infusions:     . heparin 1,950 Units/hr (09/11/11 0129)      Assessment: Patient receiving Heparin via infusion for USAP.  Heparin level approaching goal after rate increases.  Goal of Therapy:  Heparin level  0.3 - 0.7   Plan:  Increase Heparin to 2200 units/hr. Next Heparin level in 6 hours.  [employer], Pharm.D., BCPS Clinical Pharmacist Pager [phone]   09/11/2011 4:28 AM

## 2011-09-11 NOTE — Progress Notes (Signed)
ANTICOAGULATION CONSULT NOTE - Follow Up Consult  Pharmacy Consult for Heparin Indication: chest pain/ACS  Allergies  Allergen Reactions  . Lisinopril Cough  . Methotrexate     REACTION: peri-oral and buccal lesions.    Patient Measurements: Height: 5\' 6"  (167.6 cm) Weight: 262 lb 12.6 oz (119.2 kg) IBW/kg (Calculated) : 59.3  Adjusted Body Weight: 87.6 kg  Vital Signs: Temp: 98.1 F (36.7 C) (12/01 0552) Temp src: Oral (12/01 0552) BP: 125/81 mmHg (12/01 0552) Pulse Rate: 76  (12/01 0552)  Labs:  Basename 09/11/11 1100 09/11/11 0530 09/11/11 0233 09/10/11 1926 09/10/11 1524 09/10/11 0915 09/10/11 0525 09/09/11 2323 09/09/11 0748  HGB -- 10.8* -- -- -- -- 12.0 -- --  HCT -- 34.0* -- -- -- -- 35.2* -- 34.9*  PLT -- 287 -- -- -- -- 311 -- 285  APTT -- -- -- -- -- -- -- -- 28  LABPROT -- -- -- -- -- -- -- -- 13.3  INR -- -- -- -- -- -- -- -- 0.99  HEPARINUNFRC 0.24* -- 0.24* 0.11* -- -- -- -- --  CREATININE -- -- -- -- -- -- -- -- 0.55  CKTOTAL -- -- -- -- 30 27 -- 37 --  CKMB -- -- -- -- 1.8 1.8 -- 1.9 --  TROPONINI -- -- -- -- <0.30 <0.30 -- <0.30 --   Estimated Creatinine Clearance: 108.2 ml/min (by C-G formula based on Cr of 0.55).   Medications:  Scheduled:    . allopurinol  100 mg Oral Daily  . aspirin EC  81 mg Oral Daily  . atenolol  50 mg Oral Daily  . azithromycin  250 mg Oral Daily  . buPROPion  300 mg Oral Daily  . Cholecalciferol  2,000 Units Oral Daily  . cloNIDine  0.1 mg Oral Daily  . fenofibrate  160 mg Oral Daily  . furosemide  20 mg Oral Daily  . heparin  2,000 Units Intravenous Once  . insulin aspart  0-15 Units Subcutaneous TID WC  . insulin aspart  0-5 Units Subcutaneous QHS  . insulin glargine  10 Units Subcutaneous QHS  . losartan  50 mg Oral Daily  . pantoprazole  40 mg Oral Q1200  . potassium chloride SA  20 mEq Oral Daily  . predniSONE  5 mg Oral Daily  . senna  1 tablet Oral BID  . venlafaxine  75 mg Oral Daily     Assessment: 50 y/o female patient admitted with chest pain receiving heparin gtt at 2200 units/hr. Xa level is subtherapeutic, will rebolus and increase rate. No bleeding reported.  Goal of Therapy:  Heparin level 0.3-0.7 units/ml   Plan:  Heparin 2000 unit IV bolus then increase gtt to 2500 units/hr. Check 6 hour heparin level.  Verlene Mayer, PharmD, BCPS Pager [phone]  09/11/2011,1:15 PM

## 2011-09-12 LAB — CBC
HCT: 35.4 % — ABNORMAL LOW (ref 36.0–46.0)
Hemoglobin: 12 g/dL (ref 12.0–15.0)
MCH: 31.5 pg (ref 26.0–34.0)
MCHC: 33.9 g/dL (ref 30.0–36.0)
MCV: 92.9 fL (ref 78.0–100.0)
Platelets: 293 10*3/uL (ref 150–400)
RBC: 3.81 MIL/uL — ABNORMAL LOW (ref 3.87–5.11)
RDW: 12.6 % (ref 11.5–15.5)
WBC: 12 10*3/uL — ABNORMAL HIGH (ref 4.0–10.5)

## 2011-09-12 LAB — BASIC METABOLIC PANEL
BUN: 14 mg/dL (ref 6–23)
CO2: 28 mEq/L (ref 19–32)
Calcium: 9.1 mg/dL (ref 8.4–10.5)
Chloride: 97 mEq/L (ref 96–112)
Creatinine, Ser: 0.58 mg/dL (ref 0.50–1.10)
GFR calc Af Amer: >90 mL/min (ref >90)
GFR calc non Af Amer: >90 mL/min (ref >90)
Glucose, Bld: 136 mg/dL — ABNORMAL HIGH (ref 70–99)
Potassium: 3.6 mEq/L (ref 3.5–5.1)
Sodium: 136 mEq/L (ref 135–145)

## 2011-09-12 LAB — HEPARIN LEVEL (UNFRACTIONATED)
Heparin Unfractionated: 0.3 IU/mL (ref 0.30–0.70)
Heparin Unfractionated: 0.81 IU/mL — ABNORMAL HIGH (ref 0.30–0.70)
Heparin Unfractionated: 0.69 IU/mL (ref 0.30–0.70)

## 2011-09-12 LAB — GLUCOSE, CAPILLARY
Glucose-Capillary: 134 mg/dL — ABNORMAL HIGH (ref 70–99)
Glucose-Capillary: 142 mg/dL — ABNORMAL HIGH (ref 70–99)
Glucose-Capillary: 355 mg/dL — ABNORMAL HIGH (ref 70–99)

## 2011-09-12 MED ORDER — OMEGA-3-ACID ETHYL ESTERS 1 G PO CAPS: 2.0000 g | ORAL_CAPSULE | Freq: Two times a day (BID) | ORAL | Status: DC

## 2011-09-12 MED ORDER — CYCLOBENZAPRINE HCL 10 MG PO TABS
10.0000 mg | ORAL_TABLET | Freq: Three times a day (TID) | ORAL | Status: AC | PRN
Start: 2011-09-12 — End: 2011-09-22

## 2011-09-12 NOTE — Discharge Summary (Signed)
[name] MRN: [id number] DOB/AGE: 52/52/1962 52 y.o.  Admit date: 09/09/2011 Discharge date: 09/12/2011  Primary Care Physician:  [name], MD, MD   Discharge Diagnoses:   Patient Active Problem List  Diagnoses  . Sarcoidosis of lung  . HYPERLIPIDEMIA  . GOUT  . OBESITY  . HYPERTENSION  . GERD  . SLEEP APNEA  . DIABETES MELLITUS, TYPE II  . Chest wall pain  . Acute sinusitis    DISCHARGE MEDICATION: Current Discharge Medication List    START taking these medications   Details  cyclobenzaprine (FLEXERIL) 10 MG tablet Take 1 tablet (10 mg total) by mouth 3 (three) times daily as needed for muscle spasms. Qty: 90 tablet, Refills: 0      CONTINUE these medications which have NOT CHANGED   Details  Adalimumab (HUMIRA PEN [place]) Inject 40 mg into the skin every 14 (fourteen) days. Injection every 2 weeks.    allopurinol (ZYLOPRIM) 100 MG tablet Take 100 mg by mouth daily.      aspirin 81 MG tablet Take 81 mg by mouth daily.      atenolol (TENORMIN) 50 MG tablet Take 50 mg by mouth daily.     benzonatate (TESSALON) 100 MG capsule Take 1 capsule (100 mg total) by mouth 3 (three) times daily as needed. Qty: 30 capsule, Refills: 3    buPROPion (WELLBUTRIN XL) 300 MG 24 hr tablet TAKE ONE TABLET BY MOUTH ONE TIME DAILY Qty: 30 tablet, Refills: 3    chlorpheniramine-HYDROcodone (TUSSIONEX) 10-8 MG/5ML LQCR Take 5 mLs by mouth every 12 (twelve) hours as needed. Qty: 140 mL, Refills: 1    Cholecalciferol (GNP VITAMIN D) 1000 UNITS tablet Take 2,000 Units by mouth daily.     ciclesonide (OMNARIS) 50 MCG/ACT nasal spray Place 2 sprays into both nostrils daily.     cloNIDine (CATAPRES) 0.1 MG tablet Take 0.1 mg by mouth daily.     DEXILANT 60 MG capsule TAKE ONE CAPSULE BY MOUTH ONE TIME DAILY Qty: 30 capsule, Refills: 11    estradiol (ESTRACE) 1 MG tablet Take 2 mg by mouth daily.     fenofibrate (TRICOR) 145 MG tablet Take 145 mg by mouth daily.        furosemide (LASIX) 20 MG tablet TAKE ONE TABLET BY MOUTH ONE TIME DAILY Qty: 30 tablet, Refills: 1    HYDROcodone-acetaminophen (NORCO) 5-325 MG per tablet Take 2 tablets by mouth every 6 (six) hours as needed for pain. Qty: 20 tablet, Refills: 0   Associated Diagnoses: Chest pain    LORazepam (ATIVAN) 1 MG tablet Take 1 tablet (1 mg total) by mouth 2 (two) times daily as needed. Qty: 30 tablet, Refills: 0    losartan (COZAAR) 50 MG tablet Take 50 mg by mouth daily.      metFORMIN (GLUCOPHAGE) 500 MG tablet Take 1 tablet (500 mg total) by mouth 2 (two) times daily with a meal. Qty: 60 tablet, Refills: 5   Associated Diagnoses: Diabetes mellitus    nitroGLYCERIN (NITROSTAT) 0.4 MG SL tablet Place 0.4 mg under the tongue every 5 (five) minutes as needed.      PATADAY 0.2 % SOLN Place 1 drop into both eyes daily as needed.     potassium chloride SA (K-DUR,KLOR-CON) 20 MEQ tablet TAKE ONE TABLET BY MOUTH THREE TIMES DAILY Qty: 90 tablet, Refills: 0    predniSONE (DELTASONE) 5 MG tablet Take 5 mg by mouth daily.     venlafaxine (EFFEXOR-XR) 75 MG 24 hr capsule Take  75 mg by mouth daily.      glucose blood (FREESTYLE LITE) test strip Use as instructed Qty: 100 each, Refills: 12   Associated Diagnoses: Diabetes mellitus      STOP taking these medications     azithromycin (ZITHROMAX Z-PAK) 250 MG tablet            Consults: Treatment Team:  Runell Gess, MD   SIGNIFICANT DIAGNOSTIC STUDIES:  Dg Chest 2 View  09/09/2011  *RADIOLOGY REPORT*  Clinical Data: Chest pain.  CHEST - 2 VIEW  Comparison: 05/27/2011  Findings: Heart and mediastinal contours are within normal limits. No focal opacities or effusions.  No acute bony abnormality.  IMPRESSION: No active cardiopulmonary disease.  Original Report Authenticated By: Cyndie Chime, M.D.   Ct Angio Chest W/cm &/or Wo Cm  09/09/2011  *RADIOLOGY REPORT*  Clinical Data:  Left chest pain radiating into back.  CT ANGIOGRAPHY  CHEST WITH CONTRAST  Technique:  Multidetector CT imaging of the chest was performed using the standard protocol during bolus administration of intravenous contrast.  Multiplanar CT image reconstructions including MIPs were obtained to evaluate the vascular anatomy.  Contrast: OMNIPAQUE IOHEXOL 350 MG/ML IV SOLN  Comparison:  05/27/2011  Findings:  Initial scan only included the aortic arch and part of the ascending and descending thoracic aorta before there was inadvertent stoppage of the CT scanner.  The patient was reinjected with contrast for a second scan.  The aorta is of normal caliber. No evidence of aortic dissection.  Central pulmonary arteries show normal patency.  There is no evidence of pulmonary edema, infiltrate, pneumothorax or pulmonary nodule.  No pleural or pericardial fluid.  No enlarged lymph nodes identified.  Focal calcified plaque present in the region of the LAD or diagonal branch.  Visualized liver shows diffuse fatty infiltration.  There are clips in the region of the GE junction from prior surgery.  Review of the MIP images confirms the above findings.  IMPRESSION:  1.  No evidence of acute aortic dissection. 2.  No acute findings. 3.  Focal calcified plaque in the region of the LAD or a diagonal branch.  Original Report Authenticated By: Reola Calkins, M.D.     ECHO:   Left ventricle: The cavity size was normal. Wall thickness was increased in a pattern of mild LVH. There was mild concentric hypertrophy. Systolic function was normal. The estimated ejection fraction was in the range of 55% to 65%. Wall motion was normal; there were no regional wall motion abnormalities. Doppler parameters are consistent with abnormal left ventricular relaxation (grade 1 diastolic dysfunction). Doppler parameters are consistent with elevated mean left atrial filling pressure. - Right ventricle: Systolic pressure was increased. - Atrial septum: No defect or patent foramen ovale  was - The right ventricular systolic pressure was increased consistent with mild pulmonary hypertension.      No results found for this or any previous visit (from the past 240 hour(s)).  BRIEF ADMITTING H & P: Ms. Zaragosa is a 50 year old morbidly obese female who states that she started having chest pain approximately 2 days ago when she was assisting her mother with lifting clothes out of a closet. She states that the pain is in her left anterior chest and radiating directly backwards to her back. She rates the pain as a 9/10 and constant without any palliative or provocative features. The patient states that IV pain medication has brought her pain from a 9 to approximately 5/10. The pain at  the time of onset was associated with some shortness of breath. The patient also states that she's noted that approximately 2 weeks prior to this and she is having shortness of breath with walking usual distances. She's had no dizziness, no lightheadedness, no near syncope, or syncope. She's had no fever chills no nausea, vomiting, or diarrhea. She's had no recent weight loss or weight gain she's had no dysuria urgency or frequency. The patient does have a history of pulmonary sarcoidosis and is followed by Dr. Shan Levans. She also was seen by Dr. Caprice Kluver in the cardiology office where she had a cardiac cath done approximately one year ago which showed normal coronary arteries.   Hospital Course:  Present on Admission:  .Chest wall pain: Ms. Batterson was admitted to the hospital and evaluated for her chest pain with serial cardiac enzymes. Patient also underwent a CT angiogram and was ruled out for pulmonary embolus or aortic dissection as a cause for her pain. Her cardiologist were called while she was in the emergency room and Dr. Corrie Dandy who is attending felt the patient should have an evaluation with a 2-D echocardiogram. He was also considering cardiac cath on this patient depending upon her clinical  course appear as he initiated IV heparin. The patient underwent 2-D echocardiogram which is felt to be unchanged from previous appear her chest wall pain improved with Flexeril and morphine. It is felt to musculoskeletal nature. Dr. Tresa Endo who found for the cardiologist today to commence with the patient off Dr. Clarene Duke in the clinic possibly for a stress test. At the time of discharge the patient's chest wall pain is resolved.  Marland KitchenDIABETES MELLITUS, TYPE II: Due to the fact that the patient had a contrast study of admission she was managed with insulin while hospitalized. The patient is now more than 72 hours out from her contrast study will be resumed on her Glucophage is an outpatient. I will defer to her primary care physician for further titration of her medications  .GERD: The patient was resumed on her protonix. Marland KitchenHYPERLIPIDEMIA: The patient had significantly increased triglycerides and her cholesterol panel. I Dr. Tresa Endo recommended the vasa 2 caps twice a day in addition to her fenofibrate.  Marland KitchenHYPERTENSION: Blood pressures well-controlled  .Sarcoidosis of lung: Resume prehospital medications and follow up with pulmonologist.  Disposition and Follow-up: Patient to followup with her primary care physician Dr. Smitty Cords [facility] service in one week. She's also followup with Dr. Julious Oka office within a week for consideration of outpatient stress test.  Discharge Orders    Future Appointments: Provider: Department: Dept Phone: Center:   10/18/2011 10:30 AM Bruce Sherolyn Buba, MD [facility] [phone] [facility]     Future Orders Please Complete By Expires   Diet Carb Modified      Scheduling Instructions:   Heart healthy   Increase activity slowly      Discharge instructions      Comments:   An appointment to see Dr. Clarene Duke to schedule outpatient stress test in one week . Please call office  tomorrow to schedule appointment.      DISCHARGE EXAM:  General: Obese, Alert, awake, oriented x3, in no acute  distress.  Blood pressure 138/73, pulse 75, temperature 98 F (36.7 C), temperature source Oral, resp. rate 19, height 5\' 6"  (1.676 m), weight 119.2 kg (262 lb 12.6 oz), SpO2 96.00%. HEENT: [place]/AT PEERL, EOMI  Neck: Trachea midline, no masses, no thyromegal,y no JVD, no carotid bruit  OROPHARYNX: Moist, No exudate/ erythema/lesions.  Heart:  Regular rate and rhythm, without murmurs, rubs, gallops, PMI non-displaced, no heaves or thrills on palpation.  Lungs: Clear to auscultation, no wheezing or rhonchi noted. No increased vocal fremitus resonant to percussion  Abdomen: Soft, nontender, nondistended, positive bowel sounds, no masses no hepatosplenomegaly noted..  Neuro: No focal neurological deficits noted cranial nerves II through XII grossly intact. DTRs 2+ bilaterally upper and lower extremities. Strength 5 out of 5 in bilateral upper and lower extremities.  Musculoskeletal: No warm swelling or erythema around joints, no spinal tenderness noted. Pain and spasm noted in the paraspinal and periscapular musculature.  Psychiatric: Patient alert and oriented x3, good insight and cognition, good recent to remote recall.  Lymph node survey: No cervical axillary or inguinal lymphadenopathy noted.   Basename 09/12/11 0500  NA 136  K 3.6  CL 97  CO2 28  GLUCOSE 136*  BUN 14  CREATININE 0.58  CALCIUM 9.1  MG --  PHOS --   No results found for this basename: AST:2,ALT:2,ALKPHOS:2,BILITOT:2,PROT:2,ALBUMIN:2 in the last 72 hours No results found for this basename: LIPASE:2,AMYLASE:2 in the last 72 hours  Basename 09/12/11 0500 09/11/11 0530  WBC 12.0* 11.0*  NEUTROABS -- --  HGB 12.0 10.8*  HCT 35.4* 34.0*  MCV 92.9 93.7  PLT 293 287   Total time for discharge process including face-to-face time approximately 35 minutes  Signed: MATTHEWS,MICHELLE A. 09/12/2011, 5:54 PM

## 2011-09-12 NOTE — Progress Notes (Signed)
Subjective: Recurrent chest pain yesterday.  Patient has had a cardiac catheterization 07/20/2010 for chest pain and that test was normal with patent coronary arteries and an EF of 50-55% with very mild global hypokinesis. She was recently at [facility] with chest pain in August of this year cardiac enzymes were negative and she has followed up with Dr. Clarene Duke since that episode. During that hospitalization she had a CT scan of her chest that was negative for pulmonary emboli and revealed no evidence of pulmonary sarcoid but she has pulmonary sarcoid and has been treated for this for several years. During that hospitalization her prednisone was increased and her pain resolved. Though she tells me today the pain she's currently having is not like the pain she was seen for in August of 2012.  She also had a 2-D echo 06/01/2011 EF is greater than 55% there are no acute changes from previous echoes.     Objective: Vital signs in last 24 hours: Temp:  [97.6 F (36.4 C)-98 F (36.7 C)] 98 F (36.7 C) (12/02 0530) Pulse Rate:  [69-75] 75  (12/02 0530) Resp:  [18-20] 19  (12/02 0530) BP: (121-138)/(73-81) 138/73 mmHg (12/02 0530) SpO2:  [95 %-97 %] 96 % (12/02 0530) Weight change:  Last BM Date: 09/11/11 Intake/Output from previous day: +840 12/01 0701 - 12/02 0700 In: 1080.4 [P.O.:720; I.V.:360.4] Out: -  Intake/Output this shift:    PE: General: Heart: Lungs: Abd: Ext:   IV heparin continues. Lab Results:  Basename 09/12/11 0500 09/11/11 0530  WBC 12.0* 11.0*  HGB 12.0 10.8*  HCT 35.4* 34.0*  PLT 293 287   BMET  Basename 09/12/11 0500  NA 136  K 3.6  CL 97  CO2 28  GLUCOSE 136*  BUN 14  CREATININE 0.58  CALCIUM 9.1    Basename 09/10/11 1524 09/10/11 0915  TROPONINI <0.30 <0.30    Lab Results  Component Value Date   CHOL 142 05/28/2011   HDL 34* 05/28/2011   LDLCALC UNABLE TO CALCULATE IF TRIGLYCERIDE OVER 400 mg/dL 1/61/0960   LDLDIRECT 45.4 01/20/2011    TRIG 524* 05/28/2011   CHOLHDL 4.2 05/28/2011   Lab Results  Component Value Date   HGBA1C 7.5* 09/10/2011     Lab Results  Component Value Date   TSH 2.093 05/27/2011    Hepatic Function Panel No results found for this basename: PROT,ALBUMIN,AST,ALT,ALKPHOS,BILITOT,BILIDIR,IBILI in the last 72 hours No results found for this basename: CHOL in the last 72 hours No results found for this basename: PROTIME in the last 72 hours    EKG: Orders placed in visit on 09/11/11  . EKG 12-LEAD    Studies/Results: No results found.  Medications: I have reviewed the patient's current medications.      [name] allopurinol  100 mg Oral Daily  . aspirin EC  81 mg Oral Daily  . atenolol  50 mg Oral Daily  . azithromycin  250 mg Oral Daily  . buPROPion  300 mg Oral Daily  . Cholecalciferol  2,000 Units Oral Daily  . cloNIDine  0.1 mg Oral Daily  . fenofibrate  160 mg Oral Daily  . furosemide  20 mg Oral Daily  . heparin  2,000 Units Intravenous Once  . insulin aspart  0-15 Units Subcutaneous TID WC  . insulin aspart  0-5 Units Subcutaneous QHS  . insulin glargine  10 Units Subcutaneous QHS  . losartan  50 mg Oral Daily  . pantoprazole  40 mg Oral Q1200  .  potassium chloride SA  20 mEq Oral Daily  . predniSONE  5 mg Oral Daily  . senna  1 tablet Oral BID  . venlafaxine  75 mg Oral Daily    Assessment/Plan: Patient Active Problem List  Diagnoses  . Sarcoidosis of lung  . HYPERLIPIDEMIA  . GOUT  . OBESITY  . HYPERTENSION  . GERD  . SLEEP APNEA  . DIABETES MELLITUS, TYPE II  . Chest pain at rest  . Acute sinusitis   PLAN:  2D Echo Pending.   LOS: 3 days   INGOLD,LAURA R 09/12/2011, 11:04 AM     Patient seen and examined. Agree with assessment and plan.  Echo shows EF 55-65% without wall motion abnormalities. Doubt ischemic cp.  Will add lovaza 2 caps bid to fenofibrate for sig increased TG's.  PPI for GERD.  Plan d/c later today with outpt myoview scan and f/u with Dr.  Clarene Duke.   Lennette Bihari, MD, [facility] 09/12/2011 11:33 AM

## 2011-09-12 NOTE — Progress Notes (Signed)
ANTICOAGULATION CONSULT NOTE - Follow Up Consult  Pharmacy Consult for Heparin Indication: chest pain/ACS  Allergies  Allergen Reactions  . Lisinopril Cough  . Methotrexate     REACTION: peri-oral and buccal lesions.    Patient Measurements: Height: 5\' 6"  (167.6 cm) Weight: 262 lb 12.6 oz (119.2 kg) IBW/kg (Calculated) : 59.3  Adjusted Body Weight: 87.6 kg  Vital Signs: Temp: 97.6 F (36.4 C) (12/01 2200) Temp src: Oral (12/01 2200) BP: 121/73 mmHg (12/01 2200) Pulse Rate: 69  (12/01 2200)  Labs:  Basename 09/11/11 2324 09/11/11 1100 09/11/11 0530 09/11/11 0233 09/10/11 1524 09/10/11 0915 09/10/11 0525 09/09/11 2323 09/09/11 0748  HGB -- -- 10.8* -- -- -- 12.0 -- --  HCT -- -- 34.0* -- -- -- 35.2* -- 34.9*  PLT -- -- 287 -- -- -- 311 -- 285  APTT -- -- -- -- -- -- -- -- 28  LABPROT -- -- -- -- -- -- -- -- 13.3  INR -- -- -- -- -- -- -- -- 0.99  HEPARINUNFRC 0.30 0.24* -- 0.24* -- -- -- -- --  CREATININE -- -- -- -- -- -- -- -- 0.55  CKTOTAL -- -- -- -- 30 27 -- 37 --  CKMB -- -- -- -- 1.8 1.8 -- 1.9 --  TROPONINI -- -- -- -- <0.30 <0.30 -- <0.30 --   Estimated Creatinine Clearance: 108.2 ml/min (by C-G formula based on Cr of 0.55).   Medications:  Scheduled:     . allopurinol  100 mg Oral Daily  . aspirin EC  81 mg Oral Daily  . atenolol  50 mg Oral Daily  . azithromycin  250 mg Oral Daily  . buPROPion  300 mg Oral Daily  . Cholecalciferol  2,000 Units Oral Daily  . cloNIDine  0.1 mg Oral Daily  . fenofibrate  160 mg Oral Daily  . furosemide  20 mg Oral Daily  . heparin  2,000 Units Intravenous Once  . insulin aspart  0-15 Units Subcutaneous TID WC  . insulin aspart  0-5 Units Subcutaneous QHS  . insulin glargine  10 Units Subcutaneous QHS  . losartan  50 mg Oral Daily  . pantoprazole  40 mg Oral Q1200  . potassium chloride SA  20 mEq Oral Daily  . predniSONE  5 mg Oral Daily  . senna  1 tablet Oral BID  . venlafaxine  75 mg Oral Daily     Goal of  Therapy:  Heparin level 0.3-0.7 units/ml  Assessment: 50 y/o female patient admitted with chest pain receiving heparin gtt at 2500 units/hr. Lost IV access earlier this evening delaying heparin level.  After restart heparin level is therapeutic at low-end of goal range.   Plan:  Increase Heparin gtt to 2700 units/hr to maintain goal. Follow-up with AM labs.  [employer], Pharm.D., BCPS Clinical Pharmacist Pager [phone]  09/12/2011,1:27 AM

## 2011-09-12 NOTE — Progress Notes (Signed)
ANTICOAGULATION CONSULT NOTE - Follow Up Consult  Pharmacy Consult for Heparin Indication: chest pain/ACS  Allergies  Allergen Reactions  . Lisinopril Cough  . Methotrexate     REACTION: peri-oral and buccal lesions.    Patient Measurements: Height: 5\' 6"  (167.6 cm) Weight: 262 lb 12.6 oz (119.2 kg) IBW/kg (Calculated) : 59.3   Vital Signs: Temp: 98 F (36.7 C) (12/02 0530) Temp src: Oral (12/02 0530) BP: 138/73 mmHg (12/02 0530) Pulse Rate: 75  (12/02 0530)  Labs:  Basename 09/12/11 0500 09/11/11 2324 09/11/11 1100 09/11/11 0530 09/10/11 1524 09/10/11 0915 09/10/11 0525 09/09/11 2323  HGB 12.0 -- -- 10.8* -- -- -- --  HCT 35.4* -- -- 34.0* -- -- 35.2* --  PLT 293 -- -- 287 -- -- 311 --  APTT -- -- -- -- -- -- -- --  LABPROT -- -- -- -- -- -- -- --  INR -- -- -- -- -- -- -- --  HEPARINUNFRC 0.81* 0.30 0.24* -- -- -- -- --  CREATININE 0.58 -- -- -- -- -- -- --  CKTOTAL -- -- -- -- 30 27 -- 37  CKMB -- -- -- -- 1.8 1.8 -- 1.9  TROPONINI -- -- -- -- <0.30 <0.30 -- <0.30   Estimated Creatinine Clearance: 108.2 ml/min (by C-G formula based on Cr of 0.58).   Medications:  Scheduled:    . allopurinol  100 mg Oral Daily  . aspirin EC  81 mg Oral Daily  . atenolol  50 mg Oral Daily  . azithromycin  250 mg Oral Daily  . buPROPion  300 mg Oral Daily  . Cholecalciferol  2,000 Units Oral Daily  . cloNIDine  0.1 mg Oral Daily  . fenofibrate  160 mg Oral Daily  . furosemide  20 mg Oral Daily  . heparin  2,000 Units Intravenous Once  . insulin aspart  0-15 Units Subcutaneous TID WC  . insulin aspart  0-5 Units Subcutaneous QHS  . insulin glargine  10 Units Subcutaneous QHS  . losartan  50 mg Oral Daily  . pantoprazole  40 mg Oral Q1200  . potassium chloride SA  20 mEq Oral Daily  . predniSONE  5 mg Oral Daily  . senna  1 tablet Oral BID  . venlafaxine  75 mg Oral Daily    Assessment: 50 y/o female patient admitted with chest pain receiving heparin for r/o MI. CE  negative, persistant chest pain. Heparin supratherapeutic today, will decrease rate. No bleeding reported.   Goal of Therapy:  Heparin level 0.3-0.7 units/ml   Plan:  Decrease heparin to 2500 units/hr and check 6 hour heparin level.  Verlene Mayer, PharmD, BCPS Pager [phone]  09/12/2011,8:03 AM

## 2011-09-13 ENCOUNTER — Telehealth: Payer: Self-pay | Admitting: Internal Medicine

## 2011-09-13 NOTE — Telephone Encounter (Signed)
Pt was discharged from hospital yesterday  And needs to be seen for a hospital follow up asap. Please advise

## 2011-09-13 NOTE — Progress Notes (Signed)
   CARE MANAGEMENT NOTE 09/13/2011  Patient:  [name], [name]   Account Number:  [id number]  Date Initiated:  09/10/2011  Documentation initiated by:  Onnie Boer  Subjective/Objective Assessment:   PT WAS ADMITTED WITH CP.     Action/Plan:   PROGRESSION OF CARE AND DISCAHRGE PLANNING   Anticipated DC Date:  09/13/2011   Anticipated DC Plan:  HOME/SELF CARE      DC Planning Services  CM consult      Choice offered to / List presented to:             Status of service:  Completed, signed off Medicare Important Message given?   (If response is "NO", the following Medicare IM given date fields will be blank) Date Medicare IM given:   Date Additional Medicare IM given:    Discharge Disposition:  HOME/SELF CARE  Per UR Regulation:  Reviewed for med. necessity/level of care/duration of stay  Comments:  UR COMPLETED 09/10/2011 Rielle Schlauch,RN, BSN 1158 PT WAS ADMITTED WITH CP.  PTA PT WAS AT HOME WITH SELF CARE.  PT MAY NEED A CATH, WILL F/U.

## 2011-10-04 ENCOUNTER — Encounter: Payer: Self-pay | Admitting: Internal Medicine

## 2011-10-08 ENCOUNTER — Telehealth: Payer: Self-pay

## 2011-10-08 MED ORDER — FLUCONAZOLE 150 MG PO TABS: 150.0000 mg | ORAL_TABLET | Freq: Once | ORAL | Status: AC

## 2011-10-08 NOTE — Telephone Encounter (Signed)
Pt states she has been on antibotics and now has a yeast infection.  Pt request an rx be sent to [facility] on [address]. Pls advise.

## 2011-10-08 NOTE — Telephone Encounter (Signed)
Here it is

## 2011-10-08 NOTE — Telephone Encounter (Signed)
Ok per Dr Lovell Sheehan, rx sent in electronically, pt aware

## 2011-10-12 LAB — HM MAMMOGRAPHY

## 2011-10-13 ENCOUNTER — Ambulatory Visit (INDEPENDENT_AMBULATORY_CARE_PROVIDER_SITE_OTHER): Payer: BC Managed Care – PPO | Admitting: Family

## 2011-10-13 ENCOUNTER — Encounter: Payer: Self-pay | Admitting: Family

## 2011-10-13 VITALS — BP 120/60 | HR 75 | Temp 98.3°F | Wt 255.0 lb

## 2011-10-13 DIAGNOSIS — R062 Wheezing: Secondary | ICD-10-CM

## 2011-10-13 DIAGNOSIS — R05 Cough: Secondary | ICD-10-CM

## 2011-10-13 DIAGNOSIS — D869 Sarcoidosis, unspecified: Secondary | ICD-10-CM | POA: Diagnosis not present

## 2011-10-13 DIAGNOSIS — R059 Cough, unspecified: Secondary | ICD-10-CM

## 2011-10-13 MED ORDER — ALBUTEROL SULFATE (2.5 MG/3ML) 0.083% IN NEBU: 2.5000 mg | INHALATION_SOLUTION | Freq: Four times a day (QID) | RESPIRATORY_TRACT | Status: DC | PRN

## 2011-10-13 MED ORDER — PREDNISONE 20 MG PO TABS: ORAL_TABLET | ORAL | Status: DC

## 2011-10-13 NOTE — Patient Instructions (Signed)
1. Hold daily Prednisone x 5 day.  2. Albuterol Nebs as needed every 6 hours.   Bronchitis Bronchitis is the body's way of reacting to injury and/or infection (inflammation) of the bronchi. Bronchi are the air tubes that extend from the windpipe into the lungs. If the inflammation becomes severe, it may cause shortness of breath. CAUSES  Inflammation may be caused by:  A virus.   Germs (bacteria).   Dust.   Allergens.   Pollutants and many other irritants.  The cells lining the bronchial tree are covered with tiny hairs (cilia). These constantly beat upward, away from the lungs, toward the mouth. This keeps the lungs free of pollutants. When these cells become too irritated and are unable to do their job, mucus begins to develop. This causes the characteristic cough of bronchitis. The cough clears the lungs when the cilia are unable to do their job. Without either of these protective mechanisms, the mucus would settle in the lungs. Then you would develop pneumonia. Smoking is a common cause of bronchitis and can contribute to pneumonia. Stopping this habit is the single most important thing you can do to help yourself. TREATMENT   Your caregiver may prescribe an antibiotic if the cough is caused by bacteria. Also, medicines that open up your airways make it easier to breathe. Your caregiver may also recommend or prescribe an expectorant. It will loosen the mucus to be coughed up. Only take over-the-counter or prescription medicines for pain, discomfort, or fever as directed by your caregiver.   Removing whatever causes the problem (smoking, for example) is critical to preventing the problem from getting worse.   Cough suppressants may be prescribed for relief of cough symptoms.   Inhaled medicines may be prescribed to help with symptoms now and to help prevent problems from returning.   For those with recurrent (chronic) bronchitis, there may be a need for steroid medicines.  SEEK  IMMEDIATE MEDICAL CARE IF:   During treatment, you develop more pus-like mucus (purulent sputum).   You have a fever.   Your baby is older than 3 months with a rectal temperature of 102 F (38.9 C) or higher.   Your baby is 23 months old or younger with a rectal temperature of 100.4 F (38 C) or higher.   You become progressively more ill.   You have increased difficulty breathing, wheezing, or shortness of breath.  It is necessary to seek immediate medical care if you are elderly or sick from any other disease. MAKE SURE YOU:   Understand these instructions.   Will watch your condition.   Will get help right away if you are not doing well or get worse.  Document Released: 09/27/2005 Document Revised: 06/09/2011 Document Reviewed: 08/06/2008 [facility] Patient Information 2012 [place], [place].

## 2011-10-13 NOTE — Progress Notes (Signed)
Subjective:    Patient ID: [name], female    DOB: 09/20/1961, 51 y.o.   MRN: [id number]  HPI DC-year-old white female, nonsmoker, patient of Dr. Cato Mulligan is in today with complaints of cough, congestion, wheezing and sore throat and one off for one week and worsening. She's been taking over-the-counter medication with no relief. She's also been using an albuterol inhaler that helped some. Cough is productive with green phlegm. She denies any sick contacts. She has a history of sarcoidosis.   Review of Systems  Constitutional: Positive for fatigue.  HENT: Negative.   Respiratory: Positive for cough, shortness of breath and wheezing.   Cardiovascular: Negative.   Gastrointestinal: Negative.   Genitourinary: Negative.   Musculoskeletal: Negative.   Neurological: Negative.   Hematological: Negative.        Past Medical History  Diagnosis Date  . Sarcoidosis 04/10/2007  . DIABETES MELLITUS, TYPE II 08/21/2006  . HYPERLIPIDEMIA 08/21/2006  . GOUT 08/20/2010  . OBESITY 06/04/2009  . ANEMIA-UNSPECIFIED 09/18/2009  . HYPERTENSION 08/21/2006  . GERD 08/21/2006  . SLEEP APNEA 04/21/2009  . Shortness of breath     History   Social History  . Marital Status: Married    Spouse Name: N/A    Number of Children: N/A  . Years of Education: N/A   Occupational History  . DISABLED    Social History Main Topics  . Smoking status: Never Smoker   . Smokeless tobacco: Never Used  . Alcohol Use: No  . Drug Use: No  . Sexually Active: Yes   Other Topics Concern  . Not on file   Social History Narrative  . No narrative on file    Past Surgical History  Procedure Date  . Ventral hernia repair   . Nissen fundoplication   . Cholecystectomy   . Abdominal hysterectomy   . Knee arthroscopy     right  . Tubal ligation     Family History  Problem Relation Age of Onset  . Diabetes Father   . Heart attack Father   . Coronary artery disease Father   . COPD Mother   .  Emphysema Mother   . Asthma Mother   . Sarcoidosis Maternal Uncle   . Colon cancer Neg Hx   . Lung cancer Brother     Allergies  Allergen Reactions  . Lisinopril Cough  . Methotrexate     REACTION: peri-oral and buccal lesions.    Current Outpatient Prescriptions on File Prior to Visit  Medication Sig Dispense Refill  . Adalimumab (HUMIRA PEN [place]) Inject 40 mg into the skin every 14 (fourteen) days. Injection every 2 weeks.      [name] allopurinol (ZYLOPRIM) 100 MG tablet Take 100 mg by mouth daily.        [name] aspirin 81 MG tablet Take 81 mg by mouth daily.        [name] atenolol (TENORMIN) 50 MG tablet Take 50 mg by mouth daily.       . benzonatate (TESSALON) 100 MG capsule Take 1 capsule (100 mg total) by mouth 3 (three) times daily as needed.  30 capsule  3  . buPROPion (WELLBUTRIN XL) 300 MG 24 hr tablet TAKE ONE TABLET BY MOUTH ONE TIME DAILY  30 tablet  3  . chlorpheniramine-HYDROcodone (TUSSIONEX) 10-8 MG/5ML LQCR Take 5 mLs by mouth every 12 (twelve) hours as needed.  140 mL  1  . Cholecalciferol (GNP VITAMIN D) 1000 UNITS tablet Take 2,000 Units by mouth daily.       [name]  ciclesonide (OMNARIS) 50 MCG/ACT nasal spray Place 2 sprays into both nostrils daily.       . cloNIDine (CATAPRES) 0.1 MG tablet Take 0.1 mg by mouth daily.       [name] DEXILANT 60 MG capsule TAKE ONE CAPSULE BY MOUTH ONE TIME DAILY  30 capsule  11  . estradiol (ESTRACE) 1 MG tablet Take 2 mg by mouth daily.       . fenofibrate (TRICOR) 145 MG tablet Take 145 mg by mouth daily.        . furosemide (LASIX) 20 MG tablet TAKE ONE TABLET BY MOUTH ONE TIME DAILY  30 tablet  1  . glucose blood (FREESTYLE LITE) test strip Use as instructed  100 each  12  . HYDROcodone-acetaminophen (NORCO) 5-325 MG per tablet Take 2 tablets by mouth every 6 (six) hours as needed for pain.  20 tablet  0  . LORazepam (ATIVAN) 1 MG tablet Take 1 tablet (1 mg total) by mouth 2 (two) times daily as needed.  30 tablet  0  . losartan (COZAAR) 50 MG tablet  Take 50 mg by mouth daily.        . metFORMIN (GLUCOPHAGE) 500 MG tablet Take 1 tablet (500 mg total) by mouth 2 (two) times daily with a meal.  60 tablet  5  . nitroGLYCERIN (NITROSTAT) 0.4 MG SL tablet Place 0.4 mg under the tongue every 5 (five) minutes as needed.        [name] omega-3 acid ethyl esters (LOVAZA) 1 G capsule Take 2 capsules (2 g total) by mouth 2 (two) times daily.  60 capsule  0  . PATADAY 0.2 % SOLN Place 1 drop into both eyes daily as needed.       . potassium chloride SA (K-DUR,KLOR-CON) 20 MEQ tablet TAKE ONE TABLET BY MOUTH THREE TIMES DAILY  90 tablet  0  . predniSONE (DELTASONE) 5 MG tablet Take 5 mg by mouth daily.       [name] venlafaxine (EFFEXOR-XR) 75 MG 24 hr capsule Take 75 mg by mouth daily.          BP 120/60  Pulse 75  Temp(Src) 98.3 F (36.8 C) (Oral)  Wt 255 lb (115.667 kg)  SpO2 97%chart Objective:   Physical Exam  Constitutional: She is oriented to person, place, and time. She appears well-developed and well-nourished.  HENT:  Right Ear: External ear normal.  Left Ear: External ear normal.  Nose: Nose normal.  Mouth/Throat: Oropharynx is clear and moist.  Eyes: Conjunctivae and EOM are normal. Pupils are equal, round, and reactive to light.  Neck: Normal range of motion. Neck supple.  Cardiovascular: Normal rate and normal heart sounds.   Pulmonary/Chest: Effort normal. She has wheezes.  Musculoskeletal: Normal range of motion.  Neurological: She is alert and oriented to person, place, and time.  Skin: Skin is warm and dry.          Assessment & Plan:  Assessment: Acute bronchitis, cough, sarcoidosis  Plan: Nebulizer treatment given in the office and effective. Continue at-home meds. Continue albuterol when necessary. Prednisone 60 mg by mouth every morning x5 days. Patient to call if symptoms worsen or persist, recheck as scheduled or when necessary.

## 2011-10-15 ENCOUNTER — Ambulatory Visit: Payer: BC Managed Care – PPO | Admitting: Internal Medicine

## 2011-10-18 ENCOUNTER — Encounter: Payer: Self-pay | Admitting: Internal Medicine

## 2011-10-18 ENCOUNTER — Ambulatory Visit (INDEPENDENT_AMBULATORY_CARE_PROVIDER_SITE_OTHER): Payer: BC Managed Care – PPO | Admitting: Internal Medicine

## 2011-10-18 DIAGNOSIS — D869 Sarcoidosis, unspecified: Secondary | ICD-10-CM

## 2011-10-18 DIAGNOSIS — J99 Respiratory disorders in diseases classified elsewhere: Secondary | ICD-10-CM

## 2011-10-18 DIAGNOSIS — I10 Essential (primary) hypertension: Secondary | ICD-10-CM

## 2011-10-18 DIAGNOSIS — D860 Sarcoidosis of lung: Secondary | ICD-10-CM

## 2011-10-18 MED ORDER — DOXYCYCLINE HYCLATE 100 MG PO TABS: 100.0000 mg | ORAL_TABLET | Freq: Two times a day (BID) | ORAL | Status: AC

## 2011-10-18 NOTE — Progress Notes (Signed)
Patient ID: [name], female   DOB: [birth year], 51 y.o.   MRN: [id number] Chest wall pain---resolved---reviewed hospitalization records Sarcoid--patient states she is going to [facility] for opinion related to sarcoid DM-- Lab Results  Component Value Date   HGBA1C 7.5* 09/10/2011   Tolerating meds--no insulin at this time  HTN---tolerating meds  Cough/uri--reviewed last week's note--prednisone had no effect on cough. She continues to have mildly productive cough.  Past Medical History  Diagnosis Date  . Sarcoidosis 04/10/2007  . DIABETES MELLITUS, TYPE II 08/21/2006  . HYPERLIPIDEMIA 08/21/2006  . GOUT 08/20/2010  . OBESITY 06/04/2009  . ANEMIA-UNSPECIFIED 09/18/2009  . HYPERTENSION 08/21/2006  . GERD 08/21/2006  . SLEEP APNEA 04/21/2009  . Shortness of breath     History   Social History  . Marital Status: Married    Spouse Name: N/A    Number of Children: N/A  . Years of Education: N/A   Occupational History  . DISABLED    Social History Main Topics  . Smoking status: Never Smoker   . Smokeless tobacco: Never Used  . Alcohol Use: No  . Drug Use: No  . Sexually Active: Yes   Other Topics Concern  . Not on file   Social History Narrative  . No narrative on file    Past Surgical History  Procedure Date  . Ventral hernia repair   . Nissen fundoplication   . Cholecystectomy   . Abdominal hysterectomy   . Knee arthroscopy     right  . Tubal ligation     Family History  Problem Relation Age of Onset  . Diabetes Father   . Heart attack Father   . Coronary artery disease Father   . COPD Mother   . Emphysema Mother   . Asthma Mother   . Sarcoidosis Maternal Uncle   . Colon cancer Neg Hx   . Lung cancer Brother     Allergies  Allergen Reactions  . Lisinopril Cough  . Methotrexate     REACTION: peri-oral and buccal lesions.    Current Outpatient Prescriptions on File Prior to Visit  Medication Sig Dispense Refill  . Adalimumab (HUMIRA PEN [place])  Inject 40 mg into the skin every 14 (fourteen) days. Injection every 2 weeks.      [name] albuterol (PROVENTIL) (2.5 MG/3ML) 0.083% nebulizer solution Take 3 mLs (2.5 mg total) by nebulization every 6 (six) hours as needed for wheezing.  150 mL  1  . allopurinol (ZYLOPRIM) 100 MG tablet Take 100 mg by mouth daily.        [name] aspirin 81 MG tablet Take 81 mg by mouth daily.        [name] atenolol (TENORMIN) 50 MG tablet Take 50 mg by mouth daily.       . benzonatate (TESSALON) 100 MG capsule Take 1 capsule (100 mg total) by mouth 3 (three) times daily as needed.  30 capsule  3  . buPROPion (WELLBUTRIN XL) 300 MG 24 hr tablet TAKE ONE TABLET BY MOUTH ONE TIME DAILY  30 tablet  3  . chlorpheniramine-HYDROcodone (TUSSIONEX) 10-8 MG/5ML LQCR Take 5 mLs by mouth every 12 (twelve) hours as needed.  140 mL  1  . Cholecalciferol (GNP VITAMIN D) 1000 UNITS tablet Take 2,000 Units by mouth daily.       . ciclesonide (OMNARIS) 50 MCG/ACT nasal spray Place 2 sprays into both nostrils daily.       . cloNIDine (CATAPRES) 0.1 MG tablet Take 0.1 mg by mouth  daily.       [name] DEXILANT 60 MG capsule TAKE ONE CAPSULE BY MOUTH ONE TIME DAILY  30 capsule  11  . estradiol (ESTRACE) 1 MG tablet Take 2 mg by mouth daily.       . fenofibrate (TRICOR) 145 MG tablet Take 145 mg by mouth daily.        . furosemide (LASIX) 20 MG tablet TAKE ONE TABLET BY MOUTH ONE TIME DAILY  30 tablet  1  . glucose blood (FREESTYLE LITE) test strip Use as instructed  100 each  12  . HYDROcodone-acetaminophen (NORCO) 5-325 MG per tablet Take 2 tablets by mouth every 6 (six) hours as needed for pain.  20 tablet  0  . LORazepam (ATIVAN) 1 MG tablet Take 1 tablet (1 mg total) by mouth 2 (two) times daily as needed.  30 tablet  0  . losartan (COZAAR) 50 MG tablet Take 50 mg by mouth daily.        . metFORMIN (GLUCOPHAGE) 500 MG tablet Take 1 tablet (500 mg total) by mouth 2 (two) times daily with a meal.  60 tablet  5  . nitroGLYCERIN (NITROSTAT) 0.4 MG SL  tablet Place 0.4 mg under the tongue every 5 (five) minutes as needed.        [name] omega-3 acid ethyl esters (LOVAZA) 1 G capsule Take 2 capsules (2 g total) by mouth 2 (two) times daily.  60 capsule  0  . PATADAY 0.2 % SOLN Place 1 drop into both eyes daily as needed.       . potassium chloride SA (K-DUR,KLOR-CON) 20 MEQ tablet TAKE ONE TABLET BY MOUTH THREE TIMES DAILY  90 tablet  0  . venlafaxine (EFFEXOR-XR) 75 MG 24 hr capsule Take 75 mg by mouth daily.           patient denies chest pain, shortness of breath, orthopnea. Denies lower extremity edema, abdominal pain, change in appetite, change in bowel movements. Patient denies rashes, musculoskeletal complaints. No other specific complaints in a complete review of systems.   BP 162/84  Pulse 101  Temp(Src) 98.2 F (36.8 C) (Oral)  Wt 257 lb (116.574 kg)  Well-developed well-nourished female in no acute distress. HEENT exam atraumatic, normocephalic, extraocular muscles are intact. Neck is supple. No jugular venous distention no thyromegaly. Chest clear to auscultation without increased work of breathing. Cardiac exam S1 and S2 are regular. Abdominal exam active bowel sounds, soft, nontender. Extremities no edema. Neurologic exam she is alert without any motor sensory deficits. Gait is normal.

## 2011-10-19 NOTE — Assessment & Plan Note (Signed)
BP Readings from Last 3 Encounters:  10/18/11 140/74  10/13/11 120/60  09/12/11 138/73  reasonable control Continue same meds

## 2011-10-19 NOTE — Assessment & Plan Note (Addendum)
She has f/u at [facility] Today she has significant cough Trial abx today

## 2011-10-20 ENCOUNTER — Other Ambulatory Visit: Payer: Self-pay | Admitting: *Deleted

## 2011-10-20 DIAGNOSIS — N393 Stress incontinence (female) (male): Secondary | ICD-10-CM | POA: Diagnosis not present

## 2011-10-20 MED ORDER — BUPROPION HCL ER (XL) 300 MG PO TB24: 300.0000 mg | ORAL_TABLET | Freq: Every day | ORAL | Status: DC

## 2011-10-20 MED ORDER — FUROSEMIDE 20 MG PO TABS: 20.0000 mg | ORAL_TABLET | Freq: Every day | ORAL | Status: DC

## 2011-10-20 MED ORDER — POTASSIUM CHLORIDE CRYS ER 20 MEQ PO TBCR: 20.0000 meq | EXTENDED_RELEASE_TABLET | Freq: Three times a day (TID) | ORAL | Status: DC

## 2011-10-21 ENCOUNTER — Other Ambulatory Visit: Payer: Self-pay | Admitting: Obstetrics and Gynecology

## 2011-10-26 DIAGNOSIS — Z9889 Other specified postprocedural states: Secondary | ICD-10-CM | POA: Diagnosis not present

## 2011-10-26 DIAGNOSIS — D869 Sarcoidosis, unspecified: Secondary | ICD-10-CM | POA: Diagnosis not present

## 2011-10-26 DIAGNOSIS — I10 Essential (primary) hypertension: Secondary | ICD-10-CM | POA: Diagnosis not present

## 2011-10-26 DIAGNOSIS — IMO0002 Reserved for concepts with insufficient information to code with codable children: Secondary | ICD-10-CM | POA: Diagnosis not present

## 2011-10-26 DIAGNOSIS — Z6841 Body Mass Index (BMI) 40.0 and over, adult: Secondary | ICD-10-CM | POA: Diagnosis not present

## 2011-10-26 DIAGNOSIS — E119 Type 2 diabetes mellitus without complications: Secondary | ICD-10-CM | POA: Diagnosis not present

## 2011-10-26 DIAGNOSIS — E7800 Pure hypercholesterolemia, unspecified: Secondary | ICD-10-CM | POA: Diagnosis not present

## 2011-10-26 DIAGNOSIS — R071 Chest pain on breathing: Secondary | ICD-10-CM | POA: Diagnosis not present

## 2011-10-26 DIAGNOSIS — Z79899 Other long term (current) drug therapy: Secondary | ICD-10-CM | POA: Diagnosis not present

## 2011-11-01 ENCOUNTER — Encounter (HOSPITAL_COMMUNITY): Payer: Self-pay | Admitting: Pharmacist

## 2011-11-04 ENCOUNTER — Encounter (HOSPITAL_COMMUNITY)
Admission: RE | Admit: 2011-11-04 | Discharge: 2011-11-04 | Disposition: A | Discharge status: Home / Self Care | Payer: BC Managed Care – PPO | Source: Ambulatory Visit | Attending: Obstetrics and Gynecology | Admitting: Obstetrics and Gynecology

## 2011-11-04 ENCOUNTER — Encounter (HOSPITAL_COMMUNITY): Payer: Self-pay

## 2011-11-04 LAB — CBC
HCT: 35.5 % — ABNORMAL LOW (ref 36.0–46.0)
Hemoglobin: 11.6 g/dL — ABNORMAL LOW (ref 12.0–15.0)
MCH: 30.2 pg (ref 26.0–34.0)
MCHC: 32.7 g/dL (ref 30.0–36.0)
MCV: 92.4 fL (ref 78.0–100.0)
Platelets: 256 10*3/uL (ref 150–400)
RBC: 3.84 MIL/uL — ABNORMAL LOW (ref 3.87–5.11)
RDW: 12.7 % (ref 11.5–15.5)
WBC: 12.4 10*3/uL — ABNORMAL HIGH (ref 4.0–10.5)

## 2011-11-04 NOTE — Patient Instructions (Addendum)
20 [name]  11/04/2011   Your procedure is scheduled on:  11/11/11  Enter through the Main Entrance of [facility] at 6 AM.  Pick up the phone at the desk and dial [phone].   Call this number if you have problems the morning of surgery: [phone]   Remember:   Do not eat food:After Midnight.  Do not drink clear liquids: After Midnight.  Take these medicines the morning of surgery with A SIP OF WATER: Hold Metformin for 24hrs prior to surgery. Take AM Blood pressure medications early as possible day of surgery. Hold Welbutrin am of surgery.   Do not wear jewelry, make-up or nail polish.  Do not wear lotions, powders, or perfumes. You may wear deodorant.  Do not shave 48 hours prior to surgery.  Do not bring valuables to the hospital.  Contacts, dentures or bridgework may not be worn into surgery.  Leave suitcase in the car. After surgery it may be brought to your room.  For patients admitted to the hospital, checkout time is 11:00 AM the day of discharge.   Patients discharged the day of surgery will not be allowed to drive home.  Name and phone number of your driver: NA  Special Instructions: CHG Shower Use Special Wash: 1/2 bottle night before surgery and 1/2 bottle morning of surgery.   Please read over the following fact sheets that you were given: Surgical Site Infection Prevention

## 2011-11-04 NOTE — Anesthesia Preprocedure Evaluation (Addendum)
Anesthesia Evaluation  Patient identified by MRN, date of birth, ID band Patient awake    Reviewed: Allergy & Precautions, H&P , NPO status , Patient's Chart, lab work & pertinent test results, reviewed documented beta blocker date and time   History of Anesthesia Complications Negative for: history of anesthetic complications  Airway Mallampati: I TM Distance: >3 FB Neck ROM: full    Dental  (+) Edentulous Upper and Edentulous Lower   Pulmonary shortness of breath and with exertion, Recent URI  (cold 3 weeks ago, never febrile, took antibiotics), Resolved,  Pulmonary sarcoidosis, on prednisone continuously since 2000, also on humira, uses albuterol rescue inhaler - last used last week clear to auscultation  Pulmonary exam normal       Cardiovascular hypertension (BP today 178/98.  has not taken morning meds today.  Per pt this is high for her - relates to nervousness), On Home Beta Blockers + angina (cardiac work-up in May and Dec of 2012 was negative (cardiac cath at [facility] in 10/11, stress test 12/12, echo 12/12)  Steward Drone is obtaining these records) at rest regular Normal    Neuro/Psych Negative Neurological ROS  Negative Psych ROS   GI/Hepatic Neg liver ROS, GERD-  Medicated,  Endo/Other  Diabetes mellitus-, Type 2, Oral Hypoglycemic AgentsMorbid obesity  Renal/GU negative Renal ROS  Female GU complaint     Musculoskeletal  (+) Arthritis - (gout - not currently active),   Abdominal   Peds  Hematology negative hematology ROS (+)   Anesthesia Other Findings   Reproductive/Obstetrics negative OB ROS                           Anesthesia Physical Anesthesia Plan  ASA: III  Anesthesia Plan: Spinal and General LMA   Post-op Pain Management:    Induction:   Airway Management Planned:   Additional Equipment:   Intra-op Plan:   Post-operative Plan:   Informed Consent: I have reviewed the  patients History and Physical, chart, labs and discussed the procedure including the risks, benefits and alternatives for the proposed anesthesia with the patient or authorized representative who has indicated his/her understanding and acceptance.   Dental Advisory Given  Plan Discussed with: CRNA and Surgeon  Anesthesia Plan Comments: (Patient deciding spinal vs GA with LMA.  11/11/11 - patient has decided she wants to have GA rather than spinal.  Understands there is greater chance of pulmonary complications with GA.  Lungs sound clear today and she will use inhaler prior to going to OR.  Jasmine December, MD)       Anesthesia Quick Evaluation

## 2011-11-09 ENCOUNTER — Ambulatory Visit (INDEPENDENT_AMBULATORY_CARE_PROVIDER_SITE_OTHER): Payer: BC Managed Care – PPO | Admitting: Critical Care Medicine

## 2011-11-09 ENCOUNTER — Telehealth: Payer: Self-pay | Admitting: *Deleted

## 2011-11-09 ENCOUNTER — Encounter: Payer: Self-pay | Admitting: Critical Care Medicine

## 2011-11-09 DIAGNOSIS — D869 Sarcoidosis, unspecified: Secondary | ICD-10-CM | POA: Diagnosis not present

## 2011-11-09 DIAGNOSIS — D860 Sarcoidosis of lung: Secondary | ICD-10-CM

## 2011-11-09 DIAGNOSIS — J99 Respiratory disorders in diseases classified elsewhere: Secondary | ICD-10-CM | POA: Diagnosis not present

## 2011-11-09 NOTE — Patient Instructions (Signed)
No change in medications. Return in          4 months You are clear for bladder surgery, we will let Dr Dareen Piano know this information

## 2011-11-09 NOTE — Telephone Encounter (Signed)
Spoke with Lequita Halt.  Requested we fax a note to [phone] stating this. Advised I would fax OV note from today and if anything further is needed to please call back.

## 2011-11-09 NOTE — Progress Notes (Signed)
Subjective:    Patient ID: [name], female    DOB: [birth year], 51 y.o.   MRN: [id number]  HPI 51 y.o. WF with Sarcoid, GERD, VCD, cyclical cough   9/26 Doing well ,  Min cough,  Dyspnea ok,  Some pndrip noted.  Occ QHS gasping for breath Pt denies any significant sore throat, nasal congestion or excess secretions, fever, chills, sweats, unintended weight loss, pleurtic or exertional chest pain, orthopnea PND, or leg swelling Pt denies any increase in rescue therapy over baseline, denies waking up needing it or having any early am or nocturnal exacerbations of coughing/wheezing/or dyspnea. Pt also denies any obvious fluctuation in symptoms with  weather or environmental change or other alleviating or aggravating factors  1/29 [facility]  Since last ov 9/12 has been stable.  Not much cough.  No real wheeze, dyspnea is at baseline.  Went to [facility]  For second opinion. No med changes.     Past Medical History  Diagnosis Date  . Sarcoidosis 04/10/2007  . DIABETES MELLITUS, TYPE II 08/21/2006  . HYPERLIPIDEMIA 08/21/2006  . GOUT 08/20/2010  . OBESITY 06/04/2009  . ANEMIA-UNSPECIFIED 09/18/2009  . HYPERTENSION 08/21/2006  . GERD 08/21/2006  . Shortness of breath   . SLEEP APNEA 04/21/2009    last testing was negative     Family History  Problem Relation Age of Onset  . Diabetes Father   . Heart attack Father   . Coronary artery disease Father   . COPD Mother   . Emphysema Mother   . Asthma Mother   . Sarcoidosis Maternal Uncle   . Colon cancer Neg Hx   . Lung cancer Brother      History   Social History  . Marital Status: Married    Spouse Name: N/A    Number of Children: N/A  . Years of Education: N/A   Occupational History  . DISABLED    Social History Main Topics  . Smoking status: Never Smoker   . Smokeless tobacco: Never Used  . Alcohol Use: No  . Drug Use: No  . Sexually Active: Yes   Other Topics Concern  .  Not on file   Social History Narrative  . No narrative on file     Allergies  Allergen Reactions  . Lisinopril Cough  . Methotrexate     REACTION: peri-oral and buccal lesions.     Outpatient Prescriptions Prior to Visit  Medication Sig Dispense Refill  . Adalimumab (HUMIRA PEN [place]) Inject 40 mg into the skin every 14 (fourteen) days. Injection every 2 weeks.      [name] albuterol (PROVENTIL) (2.5 MG/3ML) 0.083% nebulizer solution Take 3 mLs (2.5 mg total) by nebulization every 6 (six) hours as needed for wheezing.  150 mL  1  . allopurinol (ZYLOPRIM) 100 MG tablet Take 100 mg by mouth daily.        [name] aspirin 81 MG tablet Take 81 mg by mouth daily.        [name] atenolol (TENORMIN) 50 MG tablet Take 50 mg by mouth daily.       . benzonatate (TESSALON) 100 MG capsule Take 1 capsule (100 mg total) by mouth 3 (three) times daily as needed.  30 capsule  3  . buPROPion (WELLBUTRIN XL) 300 MG 24 hr tablet Take 1 tablet (300 mg total) by mouth daily.  30 tablet  5  . chlorpheniramine-HYDROcodone (TUSSIONEX) 10-8 MG/5ML LQCR Take 5 mLs  by mouth every 12 (twelve) hours as needed.  140 mL  1  . Cholecalciferol (GNP VITAMIN D) 1000 UNITS tablet Take 2,000 Units by mouth daily.       . ciclesonide (OMNARIS) 50 MCG/ACT nasal spray Place 1 spray into both nostrils daily.       . cloNIDine (CATAPRES) 0.1 MG tablet Take 0.1 mg by mouth daily.       [name] DEXILANT 60 MG capsule TAKE ONE CAPSULE BY MOUTH ONE TIME DAILY  30 capsule  11  . estradiol (ESTRACE) 1 MG tablet Take 2 mg by mouth daily.       . fenofibrate (TRICOR) 145 MG tablet Take 145 mg by mouth daily.        . furosemide (LASIX) 20 MG tablet Take 1 tablet (20 mg total) by mouth daily.  30 tablet  5  . glucose blood (FREESTYLE LITE) test strip Use as instructed  100 each  12  . HYDROcodone-acetaminophen (NORCO) 5-325 MG per tablet Take 2 tablets by mouth every 6 (six) hours as needed for pain.  20 tablet  0  . LORazepam (ATIVAN) 1 MG tablet Take 1 tablet  (1 mg total) by mouth 2 (two) times daily as needed.  30 tablet  0  . losartan (COZAAR) 50 MG tablet Take 50 mg by mouth daily.        . metFORMIN (GLUCOPHAGE) 500 MG tablet Take 1 tablet (500 mg total) by mouth 2 (two) times daily with a meal.  60 tablet  5  . nitroGLYCERIN (NITROSTAT) 0.4 MG SL tablet Place 0.4 mg under the tongue every 5 (five) minutes as needed.        [name] PATADAY 0.2 % SOLN Place 1 drop into both eyes daily as needed.       . potassium chloride SA (K-DUR,KLOR-CON) 20 MEQ tablet Take 1 tablet (20 mEq total) by mouth 3 (three) times daily.  90 tablet  5  . predniSONE (DELTASONE) 10 MG tablet Take 10 mg by mouth daily.      [name] venlafaxine (EFFEXOR-XR) 75 MG 24 hr capsule Take 75 mg by mouth 2 (two) times daily.          Review of Systems  Constitutional: Negative for fever and unexpected weight change.  HENT: Negative for ear pain, nosebleeds, congestion, sore throat, rhinorrhea, sneezing, trouble swallowing, dental problem, postnasal drip and sinus pressure.   Eyes: Negative for redness and itching.  Respiratory: Positive for cough. Negative for chest tightness, shortness of breath and wheezing.        Cough is improved   Cardiovascular: Negative for palpitations and leg swelling.  Gastrointestinal: Negative for nausea and vomiting.  Genitourinary: Negative for dysuria.  Musculoskeletal: Negative for joint swelling.  Skin: Negative for rash.  Neurological: Negative for headaches.  Hematological: Does not bruise/bleed easily.  Psychiatric/Behavioral: Negative for dysphoric mood. The patient is not nervous/anxious.        Objective:   Physical Exam  Obese female in nad Filed Vitals:   11/09/11 1432  BP: 146/86  Pulse: 102  Temp: 98.1 F (36.7 C)  TempSrc: Oral  Height: 5\' 6"  (1.676 m)  Weight: 118.933 kg (262 lb 3.2 oz)  SpO2: 97%    Gen: Pleasant, obese  in no distress,  Flat  affect  ENT: No lesions,  mouth clear,  oropharynx clear, no postnasal  drip  Neck: No JVD, no TMG, no carotid bruits  Lungs: No use of accessory muscles, no dullness to  percussion, clear without rales or rhonchi, mild pseudowheeze  Cardiovascular: RRR, heart sounds normal, no murmur or gallops, no peripheral edema  Abdomen: soft and NT, no HSM,  BS normal  Musculoskeletal: No deformities, no cyanosis or clubbing  Neuro: alert, non focal  Skin: Warm, no lesions or rashes    Assessment & Plan:   Sarcoidosis of lung Pulmonary sarcoidosis with associated  reflux disease and associated cyclical cough Stable on Humira GERD a major factor Plan No change in medications. Return in            4 months The pt is clear for planned bladder surgery     Updated Medication List Outpatient Encounter Prescriptions as of 11/09/2011  Medication Sig Dispense Refill  . Adalimumab (HUMIRA PEN [place]) Inject 40 mg into the skin every 14 (fourteen) days. Injection every 2 weeks.      [name] albuterol (PROVENTIL) (2.5 MG/3ML) 0.083% nebulizer solution Take 3 mLs (2.5 mg total) by nebulization every 6 (six) hours as needed for wheezing.  150 mL  1  . allopurinol (ZYLOPRIM) 100 MG tablet Take 100 mg by mouth daily.        [name] aspirin 81 MG tablet Take 81 mg by mouth daily.        [name] atenolol (TENORMIN) 50 MG tablet Take 50 mg by mouth daily.       . benzonatate (TESSALON) 100 MG capsule Take 1 capsule (100 mg total) by mouth 3 (three) times daily as needed.  30 capsule  3  . buPROPion (WELLBUTRIN XL) 300 MG 24 hr tablet Take 1 tablet (300 mg total) by mouth daily.  30 tablet  5  . chlorpheniramine-HYDROcodone (TUSSIONEX) 10-8 MG/5ML LQCR Take 5 mLs by mouth every 12 (twelve) hours as needed.  140 mL  1  . Cholecalciferol (GNP VITAMIN D) 1000 UNITS tablet Take 2,000 Units by mouth daily.       . ciclesonide (OMNARIS) 50 MCG/ACT nasal spray Place 1 spray into both nostrils daily.       . cloNIDine (CATAPRES) 0.1 MG tablet Take 0.1 mg by mouth daily.       [name] DEXILANT 60 MG capsule  TAKE ONE CAPSULE BY MOUTH ONE TIME DAILY  30 capsule  11  . estradiol (ESTRACE) 1 MG tablet Take 2 mg by mouth daily.       . fenofibrate (TRICOR) 145 MG tablet Take 145 mg by mouth daily.        . furosemide (LASIX) 20 MG tablet Take 1 tablet (20 mg total) by mouth daily.  30 tablet  5  . glucose blood (FREESTYLE LITE) test strip Use as instructed  100 each  12  . HYDROcodone-acetaminophen (NORCO) 5-325 MG per tablet Take 2 tablets by mouth every 6 (six) hours as needed for pain.  20 tablet  0  . LORazepam (ATIVAN) 1 MG tablet Take 1 tablet (1 mg total) by mouth 2 (two) times daily as needed.  30 tablet  0  . losartan (COZAAR) 50 MG tablet Take 50 mg by mouth daily.        . metFORMIN (GLUCOPHAGE) 500 MG tablet Take 1 tablet (500 mg total) by mouth 2 (two) times daily with a meal.  60 tablet  5  . nitroGLYCERIN (NITROSTAT) 0.4 MG SL tablet Place 0.4 mg under the tongue every 5 (five) minutes as needed.        [name] PATADAY 0.2 % SOLN Place 1 drop into both eyes daily as needed.       [name]  potassium chloride SA (K-DUR,KLOR-CON) 20 MEQ tablet Take 1 tablet (20 mEq total) by mouth 3 (three) times daily.  90 tablet  5  . predniSONE (DELTASONE) 10 MG tablet Take 10 mg by mouth daily.      [name] venlafaxine (EFFEXOR-XR) 75 MG 24 hr capsule Take 75 mg by mouth 2 (two) times daily.

## 2011-11-10 MED ORDER — DEXTROSE 5 % IV SOLN
1.0000 g | INTRAVENOUS | Status: DC
  Filled 2011-11-10: qty 1

## 2011-11-10 NOTE — H&P (Signed)
NAMECHRISTON, [name] NO.:  [id number]  MEDICAL RECORD NO.:  [id number]  LOCATION:  PERIO                         FACILITY:  [facility]  PHYSICIAN:  Malva Limes, M.D.    DATE OF BIRTH:  Oct 06, 1961  DATE OF ADMISSION:  10/20/2011 DATE OF DISCHARGE:                             HISTORY & PHYSICAL   HISTORY OF PRESENT ILLNESS:  [name] is a 51 year old, white female, G4, P2-0-2-2, who presents to [facility] for placement of transvaginal tape, secondary to a worsening history of stress urinary incontinence.  The patient states that her urinary incontinence has been present for over 5 years.  She states that in the last year to year and a half, she has had worsening symptoms.  The patient states that it significantly interferes with her lifestyle.  She cannot jump, cough, sneeze, laugh without leaking urine.  She did undergo a cystometrogram which verified the diagnosis of simple stress urinary incontinence. Prior to having the procedure performed, she expressed her understanding of the procedure, the risk and complication.  PAST MEDICAL HISTORY:  The patient has an allergy to METHOTREXATE.  She does not smoke, drink or use illicit drugs.  A list of her current medications are available on the computer.  PAST SURGICAL HISTORY:  Supracervical hysterectomy with left salpingo- oophorectomy, a hernia repair, cholecystectomy and fundoplication of her stomach along with a tubal ligation.  She has sarcoidosis, diabetes mellitus and hypertension.  PHYSICAL EXAMINATION:  GENERAL:  The patient is an overweight white female, in no apparent distress. HEENT:  Within normal limits. LUNGS:  Clear to auscultation. CARDIOVASCULAR:  Regular rate and rhythm without murmur. BREASTS:  Without masses or tenderness.  There is no lymphadenopathy. ABDOMEN:  Obese, nontender and nondistended.  There is no guarding or rebound.  There is no organomegaly. EXTREMITIES:  Within normal  limits. PELVIC:  Normal external genitalia.  The vagina are without lesions or discharge.  The cervix is parous.  There is no significant prolapse of the cervix.  There are no palpable pelvic masses. RECTAL:  Within normal limits.  IMPRESSION:  Stress urinary incontinence.  PLAN:  Proceed with transurethral sling.         ______________________________ Malva Limes, M.D.    MA/MEDQ  D:  11/09/2011  T:  11/10/2011  Job:  [id number]

## 2011-11-10 NOTE — Assessment & Plan Note (Signed)
Pulmonary sarcoidosis with associated  reflux disease and associated cyclical cough Stable on Humira GERD a major factor Plan No change in medications. Return in            4 months The pt is clear for planned bladder surgery

## 2011-11-11 ENCOUNTER — Ambulatory Visit (HOSPITAL_COMMUNITY)
Admission: RE | Admit: 2011-11-11 | Discharge: 2011-11-12 | Disposition: A | Discharge status: Home / Self Care | Payer: BC Managed Care – PPO | Source: Ambulatory Visit | Attending: Obstetrics and Gynecology | Admitting: Obstetrics and Gynecology

## 2011-11-11 ENCOUNTER — Encounter (HOSPITAL_COMMUNITY): Payer: Self-pay | Admitting: Anesthesiology

## 2011-11-11 ENCOUNTER — Ambulatory Visit (HOSPITAL_COMMUNITY): Payer: BC Managed Care – PPO | Admitting: Anesthesiology

## 2011-11-11 ENCOUNTER — Encounter (HOSPITAL_COMMUNITY): Payer: Self-pay | Admitting: *Deleted

## 2011-11-11 ENCOUNTER — Encounter (HOSPITAL_COMMUNITY)
Admission: RE | Disposition: A | Discharge status: Home / Self Care | Payer: Self-pay | Source: Ambulatory Visit | Attending: Obstetrics and Gynecology

## 2011-11-11 DIAGNOSIS — N393 Stress incontinence (female) (male): Secondary | ICD-10-CM | POA: Diagnosis not present

## 2011-11-11 DIAGNOSIS — Z01812 Encounter for preprocedural laboratory examination: Secondary | ICD-10-CM | POA: Insufficient documentation

## 2011-11-11 DIAGNOSIS — Z01818 Encounter for other preprocedural examination: Secondary | ICD-10-CM | POA: Insufficient documentation

## 2011-11-11 HISTORY — PX: CYSTOSCOPY: SHX5120

## 2011-11-11 HISTORY — PX: BLADDER SUSPENSION: SHX72

## 2011-11-11 LAB — BASIC METABOLIC PANEL
BUN: 14 mg/dL (ref 6–23)
CO2: 26 mEq/L (ref 19–32)
Calcium: 8.7 mg/dL (ref 8.4–10.5)
Chloride: 99 mEq/L (ref 96–112)
Creatinine, Ser: 0.48 mg/dL — ABNORMAL LOW (ref 0.50–1.10)
GFR calc Af Amer: >90 mL/min (ref >90)
GFR calc non Af Amer: >90 mL/min (ref >90)
Glucose, Bld: 157 mg/dL — ABNORMAL HIGH (ref 70–99)
Potassium: 3.8 mEq/L (ref 3.5–5.1)
Sodium: 137 mEq/L (ref 135–145)

## 2011-11-11 LAB — GLUCOSE, CAPILLARY
Glucose-Capillary: 145 mg/dL — ABNORMAL HIGH (ref 70–99)
Glucose-Capillary: 193 mg/dL — ABNORMAL HIGH (ref 70–99)

## 2011-11-11 SURGERY — TRANSVAGINAL TAPE (TVT) PROCEDURE
Anesthesia: General | Site: Vagina | Wound class: Clean Contaminated

## 2011-11-11 MED ORDER — INDIGOTINDISULFONATE SODIUM 8 MG/ML IJ SOLN
INTRAMUSCULAR | Status: AC
  Filled 2011-11-11: qty 5

## 2011-11-11 MED ORDER — FENTANYL CITRATE 0.05 MG/ML IJ SOLN
INTRAMUSCULAR | Status: AC
  Filled 2011-11-11: qty 5

## 2011-11-11 MED ORDER — INDIGOTINDISULFONATE SODIUM 8 MG/ML IJ SOLN
INTRAMUSCULAR | Status: DC | PRN
  Administered 2011-11-11: 5 mL via INTRAVENOUS

## 2011-11-11 MED ORDER — FUROSEMIDE 20 MG PO TABS
20.0000 mg | ORAL_TABLET | Freq: Every day | ORAL | Status: DC
  Administered 2011-11-12: 20 mg via ORAL
  Filled 2011-11-11: qty 1

## 2011-11-11 MED ORDER — ALBUTEROL SULFATE (5 MG/ML) 0.5% IN NEBU
2.5000 mg | INHALATION_SOLUTION | RESPIRATORY_TRACT | Status: DC | PRN
  Filled 2011-11-11: qty 0.5

## 2011-11-11 MED ORDER — KETOROLAC TROMETHAMINE 30 MG/ML IJ SOLN
15.0000 mg | Freq: Once | INTRAMUSCULAR | Status: AC | PRN
  Administered 2011-11-11: 30 mg via INTRAVENOUS

## 2011-11-11 MED ORDER — LABETALOL HCL 5 MG/ML IV SOLN
10.0000 mg | Freq: Once | INTRAVENOUS | Status: AC
  Administered 2011-11-11: 10 mg via INTRAVENOUS

## 2011-11-11 MED ORDER — PNEUMOCOCCAL VAC POLYVALENT 25 MCG/0.5ML IJ INJ
0.5000 mL | INJECTION | INTRAMUSCULAR | Status: DC
  Filled 2011-11-11: qty 0.5

## 2011-11-11 MED ORDER — STERILE WATER FOR IRRIGATION IR SOLN
Status: DC | PRN
  Administered 2011-11-11: 1000 mL

## 2011-11-11 MED ORDER — POTASSIUM CHLORIDE CRYS ER 20 MEQ PO TBCR
20.0000 meq | EXTENDED_RELEASE_TABLET | Freq: Three times a day (TID) | ORAL | Status: DC
  Administered 2011-11-11 – 2011-11-12 (×3): 20 meq via ORAL
  Filled 2011-11-11 (×3): qty 1

## 2011-11-11 MED ORDER — LOSARTAN POTASSIUM 50 MG PO TABS
50.0000 mg | ORAL_TABLET | Freq: Every day | ORAL | Status: DC
  Administered 2011-11-12: 50 mg via ORAL
  Filled 2011-11-11: qty 1

## 2011-11-11 MED ORDER — VENLAFAXINE HCL ER 75 MG PO CP24
75.0000 mg | ORAL_CAPSULE | Freq: Two times a day (BID) | ORAL | Status: DC
Start: 2011-11-11 — End: 2011-11-12
  Administered 2011-11-11 – 2011-11-12 (×2): 75 mg via ORAL
  Filled 2011-11-11 (×2): qty 1

## 2011-11-11 MED ORDER — LACTATED RINGERS IV SOLN
INTRAVENOUS | Status: DC
  Administered 2011-11-11: 125 mL/h via INTRAVENOUS
  Administered 2011-11-11: 07:00:00 via INTRAVENOUS

## 2011-11-11 MED ORDER — KETOROLAC TROMETHAMINE 30 MG/ML IJ SOLN
INTRAMUSCULAR | Status: AC
  Administered 2011-11-11: 30 mg via INTRAVENOUS
  Filled 2011-11-11: qty 1

## 2011-11-11 MED ORDER — DEXAMETHASONE SODIUM PHOSPHATE 10 MG/ML IJ SOLN
INTRAMUSCULAR | Status: AC
  Filled 2011-11-11: qty 1

## 2011-11-11 MED ORDER — LIDOCAINE-EPINEPHRINE 1 %-1:100000 IJ SOLN
INTRAMUSCULAR | Status: DC | PRN
  Administered 2011-11-11: 40 mL

## 2011-11-11 MED ORDER — CLONIDINE HCL 0.1 MG PO TABS
0.1000 mg | ORAL_TABLET | Freq: Every day | ORAL | Status: DC
  Administered 2011-11-12: 0.1 mg via ORAL
  Filled 2011-11-11: qty 1

## 2011-11-11 MED ORDER — FENTANYL CITRATE 0.05 MG/ML IJ SOLN: 25.0000 ug | INTRAMUSCULAR | Status: DC | PRN

## 2011-11-11 MED ORDER — OXYCODONE-ACETAMINOPHEN 5-325 MG PO TABS
1.0000 | ORAL_TABLET | ORAL | Status: DC | PRN
  Administered 2011-11-11: 1 via ORAL
  Administered 2011-11-11: 2 via ORAL
  Filled 2011-11-11: qty 1
  Filled 2011-11-11: qty 2

## 2011-11-11 MED ORDER — ONDANSETRON HCL 4 MG/2ML IJ SOLN
INTRAMUSCULAR | Status: AC
  Filled 2011-11-11: qty 2

## 2011-11-11 MED ORDER — ESTRADIOL 0.1 MG/GM VA CREA
TOPICAL_CREAM | VAGINAL | Status: AC
  Filled 2011-11-11: qty 42.5

## 2011-11-11 MED ORDER — MIDAZOLAM HCL 2 MG/2ML IJ SOLN
INTRAMUSCULAR | Status: AC
  Filled 2011-11-11: qty 2

## 2011-11-11 MED ORDER — LIDOCAINE HCL (CARDIAC) 20 MG/ML IV SOLN
INTRAVENOUS | Status: AC
  Filled 2011-11-11: qty 5

## 2011-11-11 MED ORDER — DEXTROSE IN LACTATED RINGERS 5 % IV SOLN
INTRAVENOUS | Status: DC
  Administered 2011-11-11 – 2011-11-12 (×2): via INTRAVENOUS

## 2011-11-11 MED ORDER — PROPOFOL 10 MG/ML IV EMUL
INTRAVENOUS | Status: AC
  Filled 2011-11-11: qty 20

## 2011-11-11 MED ORDER — ATENOLOL 50 MG PO TABS
50.0000 mg | ORAL_TABLET | Freq: Every day | ORAL | Status: DC
  Administered 2011-11-12: 50 mg via ORAL
  Filled 2011-11-11: qty 1

## 2011-11-11 MED ORDER — PREDNISONE 10 MG PO TABS
10.0000 mg | ORAL_TABLET | Freq: Every day | ORAL | Status: DC
  Administered 2011-11-12: 10 mg via ORAL
  Filled 2011-11-11: qty 1

## 2011-11-11 MED ORDER — BUPROPION HCL ER (XL) 300 MG PO TB24
300.0000 mg | ORAL_TABLET | Freq: Every day | ORAL | Status: DC
  Administered 2011-11-12: 300 mg via ORAL
  Filled 2011-11-11: qty 1

## 2011-11-11 MED ORDER — ALLOPURINOL 100 MG PO TABS
100.0000 mg | ORAL_TABLET | Freq: Every day | ORAL | Status: DC
  Administered 2011-11-12: 100 mg via ORAL
  Filled 2011-11-11: qty 1

## 2011-11-11 MED ORDER — FENOFIBRATE 160 MG PO TABS
160.0000 mg | ORAL_TABLET | Freq: Every day | ORAL | Status: DC
  Administered 2011-11-11 – 2011-11-12 (×2): 160 mg via ORAL
  Filled 2011-11-11 (×2): qty 1

## 2011-11-11 MED ORDER — LABETALOL HCL 5 MG/ML IV SOLN
INTRAVENOUS | Status: AC
  Administered 2011-11-11: 10 mg via INTRAVENOUS
  Filled 2011-11-11: qty 4

## 2011-11-11 MED ORDER — METFORMIN HCL 500 MG PO TABS
500.0000 mg | ORAL_TABLET | Freq: Two times a day (BID) | ORAL | Status: DC
  Administered 2011-11-11 – 2011-11-12 (×2): 500 mg via ORAL
  Filled 2011-11-11 (×2): qty 1

## 2011-11-11 MED ORDER — FENTANYL CITRATE 0.05 MG/ML IJ SOLN
INTRAMUSCULAR | Status: AC
  Filled 2011-11-11: qty 2

## 2011-11-11 MED ORDER — 0.9 % SODIUM CHLORIDE (POUR BTL) OPTIME
TOPICAL | Status: DC | PRN
  Administered 2011-11-11: 1000 mL

## 2011-11-11 SURGICAL SUPPLY — 36 items
ADH SKN CLS APL DERMABOND .7 (GAUZE/BANDAGES/DRESSINGS) ×2
CANISTER SUCTION 2500CC (MISCELLANEOUS) ×3 IMPLANT
CATH BONANNO SUPRAPUBIC 14G (CATHETERS) IMPLANT
CATH FOLEY 2WAY SLVR  5CC 14FR (CATHETERS) ×2
CATH FOLEY 2WAY SLVR  5CC 18FR (CATHETERS) ×1
CATH FOLEY 2WAY SLVR 5CC 14FR (CATHETERS) ×4 IMPLANT
CATH FOLEY 2WAY SLVR 5CC 18FR (CATHETERS) ×2 IMPLANT
CATH ROBINSON RED A/P 16FR (CATHETERS) IMPLANT
CLOTH BEACON ORANGE TIMEOUT ST (SAFETY) ×3 IMPLANT
DECANTER SPIKE VIAL GLASS SM (MISCELLANEOUS) IMPLANT
DERMABOND ADVANCED (GAUZE/BANDAGES/DRESSINGS) ×1
DERMABOND ADVANCED .7 DNX12 (GAUZE/BANDAGES/DRESSINGS) ×2 IMPLANT
GAUZE PACKING 2X5 YD STERILE (GAUZE/BANDAGES/DRESSINGS) ×3 IMPLANT
GAUZE PACKING IODOFORM 2 (PACKING) IMPLANT
GLOVE BIO SURGEON STRL SZ 6.5 (GLOVE) ×3 IMPLANT
GLOVE BIOGEL PI IND STRL 6.5 (GLOVE) ×6 IMPLANT
GLOVE BIOGEL PI INDICATOR 6.5 (GLOVE) ×3
GLOVE ECLIPSE 7.0 STRL STRAW (GLOVE) ×6 IMPLANT
GOWN PREVENTION PLUS LG XLONG (DISPOSABLE) ×6 IMPLANT
GOWN STRL REIN XL XLG (GOWN DISPOSABLE) ×3 IMPLANT
NEEDLE HYPO 22GX1.5 SAFETY (NEEDLE) IMPLANT
NEEDLE HYPO 25X1 1.5 SAFETY (NEEDLE) ×3 IMPLANT
NS IRRIG 1000ML POUR BTL (IV SOLUTION) ×3 IMPLANT
PACK VAGINAL WOMENS (CUSTOM PROCEDURE TRAY) ×3 IMPLANT
SLING TVT EXACT (Sling) ×3 IMPLANT
SUT VIC AB 2-0 CT1 27 (SUTURE) ×3
SUT VIC AB 2-0 CT1 TAPERPNT 27 (SUTURE) ×2 IMPLANT
SUT VIC AB 2-0 UR5 27 (SUTURE) IMPLANT
SUT VIC AB 2-0 UR6 27 (SUTURE) IMPLANT
SUT VICRYL 0 UR6 27IN ABS (SUTURE) IMPLANT
SUT VICRYL RAPIDE 3 0 (SUTURE) IMPLANT
TOWEL OR 17X24 6PK STRL BLUE (TOWEL DISPOSABLE) ×6 IMPLANT
TRAY FOLEY CATH 14FR (SET/KITS/TRAYS/PACK) ×3 IMPLANT
TUBING CONNECTOR 18X5MM (MISCELLANEOUS) ×3 IMPLANT
WATER STERILE IRR 1000ML POUR (IV SOLUTION) ×3 IMPLANT
YANKAUER SUCT BULB TIP NO VENT (SUCTIONS) ×3 IMPLANT

## 2011-11-11 NOTE — Progress Notes (Signed)
Pt has been reexamined. Chart reviewed. No change in plan of care.

## 2011-11-11 NOTE — Anesthesia Postprocedure Evaluation (Signed)
  Anesthesia Post-op Note  Patient: [name]  Procedure(s) Performed:  TRANSVAGINAL TAPE (TVT) PROCEDURE; CYSTOSCOPY  Patient Location: 315  Anesthesia Type: General  Level of Consciousness: awake, alert  and oriented  Airway and Oxygen Therapy: Patient Spontanous Breathing and Patient connected to nasal cannula oxygen  Post-op Pain: none  Post-op Assessment: Post-op Vital signs reviewed and Patient's Cardiovascular Status Stable  Post-op Vital Signs: Reviewed and stable  Complications: No apparent anesthesia complications

## 2011-11-12 ENCOUNTER — Encounter (HOSPITAL_COMMUNITY): Payer: Self-pay | Admitting: Obstetrics and Gynecology

## 2011-11-12 MED ORDER — MENTHOL 3 MG MT LOZG
1.0000 | LOZENGE | OROMUCOSAL | Status: DC | PRN
  Administered 2011-11-12: 3 mg via ORAL
  Filled 2011-11-12: qty 9

## 2011-11-12 NOTE — Transfer of Care (Signed)
PLEASE SEE PAPER CHART _ ANESTHESIA RECORD WAS CHARTED ON PAPER DURING EPIC DOWNTIME

## 2011-11-12 NOTE — Progress Notes (Signed)
Voiding trials:  4 voids between 8:00am and 12:00pm  Void  50    PVR  0  Void  50 PVR 41  Void 100 PVR 9  Void 300 PVR 0  Pt discharged to home with husband.  Condition stable.  Pt to car via wheelchair with Sharyon Cable, RN.  No equipment for home ordered at discharge.

## 2011-11-12 NOTE — Anesthesia Postprocedure Evaluation (Signed)
  Anesthesia Post-op Note  Patient: [name]  Procedure(s) Performed:  TRANSVAGINAL TAPE (TVT) PROCEDURE; CYSTOSCOPY  [facility]  Anesthesia Type: General  Level of Consciousness: awake  Airway and Oxygen Therapy: Patient Spontanous Breathing  Post-op Pain: none  Post-op Assessment: Patient's Cardiovascular Status Stable, Respiratory Function Stable and Pain level controlled  Post-op Vital Signs: Reviewed and stable  Complications: No apparent anesthesia complications

## 2011-11-13 NOTE — Discharge Summary (Signed)
NAMESHANEEQUA, BAHNER NO.:  [id number]  MEDICAL RECORD NO.:  [id number]  LOCATION:  9315                          FACILITY:  [facility]  PHYSICIAN:  Malva Limes, M.D.    DATE OF BIRTH:  Dec 03, 1960  DATE OF ADMISSION:  11/11/2011 DATE OF DISCHARGE:  11/12/2011                              DISCHARGE SUMMARY   PRINCIPAL DISCHARGE DIAGNOSIS:  Stress urinary incontinence.  PRINCIPLE PROCEDURES:  Placement of transvaginal tape urethral sling.  HOSPITAL COURSE:  Ms. Whitacre is a 51 year old, white female, G4 P2-0-2- 2, who was admitted to [facility] after transvaginal urethral sling was performed.  Prior to the admission, the patient had a workup, which included cystometrics.  This confirmed the diagnosis of simple urinary stress incontinence.  The patient had an uncomplicated procedure.  For complete description of this, please see the dictated operative note.  On postop day #1, the patient was ambulating without difficulty, eating a regular diet.  Her Foley catheter had been removed and she was voiding without difficulty.  The patient will be discharged to home today.  We are awaiting her final postvoid residual to determine whether she will have a catheter placed on discharge or not.  The patient will be discharged to home.  She will be discharged to follow up in the office in 4 weeks.  She was told to call the office with any severe pain, vaginal bleeding, or difficulty voiding.  She was also informed about possible UTI symptoms.  The patient will be sent home with Percocet to take p.r.n.          ______________________________ Malva Limes, M.D.     MA/MEDQ  D:  11/12/2011  T:  11/13/2011  Job:  [id number]

## 2011-11-13 NOTE — Op Note (Signed)
[name], PHILLIPS NO.:  [id number]  MEDICAL RECORD NO.:  [id number]  LOCATION:  9315                          FACILITY:  [facility]  PHYSICIAN:  Malva Limes, M.D.    DATE OF BIRTH:  [birth year]  DATE OF PROCEDURE: DATE OF DISCHARGE:  11/12/2011                              OPERATIVE REPORT   PREOPERATIVE DIAGNOSIS:  Stress urinary incontinence.  POSTOP DIAGNOSIS:  Stress urinary incontinence.  PROCEDURE:  Placement TVT Exact.  SURGEON:  Malva Limes, MD  ASSISTANT:  Carrington Clamp, MD  ANESTHESIA:  General and local.  ANTIBIOTICS:  Ancef 1 g.  DRAINS:  Foley to bedside drainage.  ESTIMATED BLOOD LOSS:  30 mL.  COMPLICATIONS:  None.  SPECIMENS:  None.  PROCEDURE:  The patient was taken to the operating room, where general anesthetic was administered without difficulty.  She was then placed in the dorsal lithotomy position.  She was prepped and draped in usual fashion for this procedure.  Her bladder was drained with a Foley catheter.  At this point, her suprapubic region was marked for the exit sites of the trocar.  A hydrodissection was performed at this point using 1% lidocaine and saline.  The anterior vaginal wall was also injected with 20 mL of 1% lidocaine.  A vertical incision was made approximately 1 cm from the urethral meatus.  The periurethral space was dissected.  At this point, the transvaginal tape was placed on the patient's right and then on the left.  At this point, a cystoscopy was performed.  No evidence of any foreign objects were in the bladder.  The dome was visualized with an air bubble.  Both ureters were identified. Indigo carmine was given and this was seen coming from both ureters. Following this, the cystoscope was removed.  The plastic sheath over the tape were removed and the tapes were tightened.  Once it was felt that the tape was in the proper position, the vaginal incision was closed using 2-0 Vicryl in  a running, locking fashion.  A Foley catheter was replaced.  The patient was awoken and taken to recovery room in stable condition.  Instrument, lap count was correct x2.  Vaginal packing was also placed prior to the patient being taken to the recovery room.          ______________________________ Malva Limes, M.D.     MA/MEDQ  D:  11/12/2011  T:  11/13/2011  Job:  [id number]

## 2011-11-22 ENCOUNTER — Other Ambulatory Visit: Payer: Self-pay | Admitting: Internal Medicine

## 2011-11-24 DIAGNOSIS — D869 Sarcoidosis, unspecified: Secondary | ICD-10-CM | POA: Diagnosis not present

## 2011-11-24 DIAGNOSIS — E2839 Other primary ovarian failure: Secondary | ICD-10-CM | POA: Diagnosis not present

## 2011-11-27 ENCOUNTER — Encounter (HOSPITAL_COMMUNITY): Payer: Self-pay | Admitting: *Deleted

## 2011-11-27 ENCOUNTER — Emergency Department (HOSPITAL_COMMUNITY): Payer: BC Managed Care – PPO

## 2011-11-27 ENCOUNTER — Emergency Department (HOSPITAL_COMMUNITY)
Admission: EM | Admit: 2011-11-27 | Discharge: 2011-11-27 | Disposition: A | Discharge status: Home / Self Care | Payer: BC Managed Care – PPO | Attending: Emergency Medicine | Admitting: Emergency Medicine

## 2011-11-27 DIAGNOSIS — X58XXXA Exposure to other specified factors, initial encounter: Secondary | ICD-10-CM | POA: Insufficient documentation

## 2011-11-27 DIAGNOSIS — R269 Unspecified abnormalities of gait and mobility: Secondary | ICD-10-CM | POA: Insufficient documentation

## 2011-11-27 DIAGNOSIS — D869 Sarcoidosis, unspecified: Secondary | ICD-10-CM | POA: Insufficient documentation

## 2011-11-27 DIAGNOSIS — Z8639 Personal history of other endocrine, nutritional and metabolic disease: Secondary | ICD-10-CM | POA: Insufficient documentation

## 2011-11-27 DIAGNOSIS — S86912A Strain of unspecified muscle(s) and tendon(s) at lower leg level, left leg, initial encounter: Secondary | ICD-10-CM

## 2011-11-27 DIAGNOSIS — Y9229 Other specified public building as the place of occurrence of the external cause: Secondary | ICD-10-CM | POA: Insufficient documentation

## 2011-11-27 DIAGNOSIS — E119 Type 2 diabetes mellitus without complications: Secondary | ICD-10-CM | POA: Insufficient documentation

## 2011-11-27 DIAGNOSIS — Z862 Personal history of diseases of the blood and blood-forming organs and certain disorders involving the immune mechanism: Secondary | ICD-10-CM | POA: Insufficient documentation

## 2011-11-27 DIAGNOSIS — E785 Hyperlipidemia, unspecified: Secondary | ICD-10-CM | POA: Insufficient documentation

## 2011-11-27 DIAGNOSIS — IMO0002 Reserved for concepts with insufficient information to code with codable children: Secondary | ICD-10-CM | POA: Insufficient documentation

## 2011-11-27 DIAGNOSIS — M79609 Pain in unspecified limb: Secondary | ICD-10-CM | POA: Insufficient documentation

## 2011-11-27 DIAGNOSIS — I10 Essential (primary) hypertension: Secondary | ICD-10-CM | POA: Insufficient documentation

## 2011-11-27 DIAGNOSIS — M25469 Effusion, unspecified knee: Secondary | ICD-10-CM | POA: Insufficient documentation

## 2011-11-27 DIAGNOSIS — M25569 Pain in unspecified knee: Secondary | ICD-10-CM | POA: Insufficient documentation

## 2011-11-27 MED ORDER — HYDROMORPHONE HCL PF 1 MG/ML IJ SOLN
1.0000 mg | Freq: Once | INTRAMUSCULAR | Status: AC
Start: 2011-11-27 — End: 2011-11-27
  Administered 2011-11-27: 1 mg via INTRAMUSCULAR
  Filled 2011-11-27: qty 1

## 2011-11-27 MED ORDER — OXYCODONE-ACETAMINOPHEN 5-325 MG PO TABS: 1.0000 | ORAL_TABLET | ORAL | Status: AC | PRN

## 2011-11-27 NOTE — ED Notes (Signed)
Alert, NAD, calm, interactive, lying supine on stretcher, husband at [facility], [facility].

## 2011-11-27 NOTE — ED Provider Notes (Signed)
History     CSN: [id number]  Arrival date & time 11/27/11  0125   First MD Initiated Contact with Patient 11/27/11 0206      Chief Complaint  Patient presents with  . Leg Pain    (Consider location/radiation/quality/duration/timing/severity/associated sxs/prior treatment) HPI Comments: Patient here with acute onset of left knee pain while [place] - she reports pain increased since then - states she took a norco without relief of the pain - states pain feels like it is behind the knee - she denies falling on the knee, denies sensation that the knee will give out - no redness, swelling, fever or chills.    Patient is a 51 y.o. female presenting with leg pain. The history is provided by the patient and the spouse. No language interpreter was used.  Leg Pain  The incident occurred 3 to 5 hours ago. The incident occurred at home. There was no injury mechanism. The pain is present in the left knee. The quality of the pain is described as sharp. The pain is at a severity of 8/10. The pain is moderate. The pain has been constant since onset. Pertinent negatives include no numbness, no inability to bear weight, no loss of motion, no muscle weakness, no loss of sensation and no tingling. She reports no foreign bodies present. She has tried acetaminophen for the symptoms. The treatment provided no relief.    Past Medical History  Diagnosis Date  . Sarcoidosis 04/10/2007  . DIABETES MELLITUS, TYPE II 08/21/2006  . HYPERLIPIDEMIA 08/21/2006  . GOUT 08/20/2010  . OBESITY 06/04/2009  . ANEMIA-UNSPECIFIED 09/18/2009  . HYPERTENSION 08/21/2006  . GERD 08/21/2006  . Shortness of breath   . SLEEP APNEA 04/21/2009    last testing was negative    Past Surgical History  Procedure Date  . Ventral hernia repair   . Nissen fundoplication   . Cholecystectomy   . Abdominal hysterectomy   . Knee arthroscopy     right  . Tubal ligation   . Bladder  suspension 11/11/2011    Procedure: TRANSVAGINAL TAPE (TVT) PROCEDURE;  Surgeon: Levi Aland, MD;  Location: [facility];  Service: Gynecology;  Laterality: N/A;  . Cystoscopy 11/11/2011    Procedure: CYSTOSCOPY;  Surgeon: Levi Aland, MD;  Location: [facility];  Service: Gynecology;  Laterality: N/A;    Family History  Problem Relation Age of Onset  . Diabetes Father   . Heart attack Father   . Coronary artery disease Father   . Heart failure Father   . COPD Mother   . Emphysema Mother   . Asthma Mother   . Heart failure Mother   . Sarcoidosis Maternal Uncle   . Colon cancer Neg Hx   . Lung cancer Brother   . Cancer Brother     History  Substance Use Topics  . Smoking status: Never Smoker   . Smokeless tobacco: Never Used  . Alcohol Use: No    OB History    Grav Para Term Preterm Abortions TAB SAB Ect Mult Living                  Review of Systems  Constitutional: Negative for fever and chills.  HENT: Negative for neck pain and neck stiffness.   Eyes: Negative for pain.  Respiratory: Negative for chest tightness and shortness of breath.   Cardiovascular: Negative for chest pain.  Gastrointestinal: Negative for abdominal pain.  Genitourinary:  Negative for dysuria and flank pain.  Musculoskeletal: Positive for joint swelling, arthralgias and gait problem.  Neurological: Negative for tingling, numbness and headaches.  All other systems reviewed and are negative.    Allergies  Lisinopril and Methotrexate  Home Medications   Current Outpatient Rx  Name Route Sig Dispense Refill  . HUMIRA PEN [place] Subcutaneous Inject 40 mg into the skin every 14 (fourteen) days. Injection every 2 weeks.    . ALBUTEROL SULFATE (2.5 MG/3ML) 0.083% IN NEBU Nebulization Take 3 mLs (2.5 mg total) by nebulization every 6 (six) hours as needed for wheezing. 150 mL 1  . ALLOPURINOL 100 MG PO TABS Oral Take 100 mg by mouth daily.      . ASPIRIN 81 MG PO TABS Oral Take 81 mg by mouth daily.       . ATENOLOL 50 MG PO TABS Oral Take 50 mg by mouth daily.     [name] BENZONATATE 100 MG PO CAPS Oral Take 100 mg by mouth 3 (three) times daily as needed. For cough    . BUPROPION HCL ER (XL) 300 MG PO TB24 Oral Take 1 tablet (300 mg total) by mouth daily. 30 tablet 5  . HYDROCOD POLST-CPM POLST ER 10-8 MG/5ML PO LQCR Oral Take 5 mLs by mouth every 12 (twelve) hours as needed. For cough & pain    . CHOLECALCIFEROL 1000 UNITS PO TABS Oral Take 2,000 Units by mouth daily.     [name] CICLESONIDE 50 MCG/ACT NA SUSP Each Nare Place 1 spray into both nostrils daily.     [name] CLONIDINE HCL 0.1 MG PO TABS Oral Take 0.1 mg by mouth daily.     [name] DEXILANT 60 MG PO CPDR  TAKE ONE CAPSULE BY MOUTH ONE TIME DAILY 30 capsule 11  . ESTRADIOL 1 MG PO TABS Oral Take 2 mg by mouth daily.     . FENOFIBRATE 145 MG PO TABS Oral Take 145 mg by mouth daily.      . FUROSEMIDE 20 MG PO TABS Oral Take 1 tablet (20 mg total) by mouth daily. 30 tablet 5  . HYDROCODONE-ACETAMINOPHEN 5-325 MG PO TABS Oral Take 2 tablets by mouth every 6 (six) hours as needed for pain. 20 tablet 0  . LORAZEPAM 1 MG PO TABS Oral Take 1 mg by mouth 2 (two) times daily as needed. For anxiety    . LOSARTAN POTASSIUM 50 MG PO TABS Oral Take 50 mg by mouth daily.      [name] METFORMIN HCL 500 MG PO TABS Oral Take 1 tablet (500 mg total) by mouth 2 (two) times daily with a meal. 60 tablet 5  . POTASSIUM CHLORIDE CRYS ER 20 MEQ PO TBCR Oral Take 1 tablet (20 mEq total) by mouth 3 (three) times daily. 90 tablet 5  . PREDNISONE 10 MG PO TABS Oral Take 10 mg by mouth daily.    . VENLAFAXINE HCL ER 75 MG PO CP24 Oral Take 75 mg by mouth 2 (two) times daily.     [name] GLUCOSE BLOOD VI STRP  Use as instructed 100 each 12  . NITROGLYCERIN 0.4 MG SL SUBL Sublingual Place 0.4 mg under the tongue every 5 (five) minutes as needed. For chest pain    . OXYCODONE-ACETAMINOPHEN 5-325 MG PO TABS Oral Take 1 tablet by mouth every 4 (four) hours as needed for pain. 30 tablet 0    BP  159/76  Pulse 88  Temp(Src) 98 F (36.7 C) (Oral)  Resp 18  SpO2 99%  Physical Exam  Nursing note and vitals reviewed. Constitutional: She is oriented to person, place, and time. She appears well-developed and well-nourished. No distress.  HENT:  Head: Normocephalic and atraumatic.  Right Ear: External ear normal.  Left Ear: External ear normal.  Nose: Nose normal.  Mouth/Throat: Oropharynx is clear and moist. No oropharyngeal exudate.  Eyes: Conjunctivae are normal. Pupils are equal, round, and reactive to light.  Neck: Normal range of motion. Neck supple.  Cardiovascular: Normal rate, regular rhythm and normal heart sounds.  Exam reveals no gallop and no friction rub.   No murmur heard. Pulmonary/Chest: Effort normal and breath sounds normal. She exhibits no tenderness.  Abdominal: Soft. Bowel sounds are normal. She exhibits no distension. There is no tenderness.  Musculoskeletal:       Left knee: She exhibits decreased range of motion and effusion. She exhibits no swelling, no deformity, no LCL laxity and no MCL laxity. tenderness found. Medial joint line tenderness noted. No patellar tendon tenderness noted.  Lymphadenopathy:    She has no cervical adenopathy.  Neurological: She is alert and oriented to person, place, and time. No cranial nerve deficit.  Skin: Skin is warm and dry.  Psychiatric: She has a normal mood and affect. Her behavior is normal. Judgment and thought content normal.    ED Course  Procedures (including critical care time)  Labs Reviewed - No data to display Dg Knee Complete 4 Views Left  11/27/2011  *RADIOLOGY REPORT*  Clinical Data: Left knee and proximal lower leg pain.  LEFT KNEE - COMPLETE 4+ VIEW  Comparison: Left knee radiographs performed 07/17/2008  Findings: There is no evidence of fracture or dislocation.  The joint spaces are preserved. Mild degenerative change is noted at the patellofemoral compartment, with mild osteophyte formation seen  superiorly.  A fabella is noted.  A small joint effusion is seen.  The visualized soft tissues are otherwise unremarkable in appearance.  IMPRESSION:  1.  No evidence of fracture or dislocation. 2.  Mild degenerative change at the patellofemoral compartment. 3.  Small knee joint effusion seen.  Original Report Authenticated By: Tonia Ghent, M.D.     1. Strain of left knee and leg       MDM  Patient with likely strain of left knee - placed in knee sleeve and crutches and will follow up with Dr. Thurston Hole whom she already sees for orthopedic issues.  Given oxycodone for pain to last through the weekend.        Izola Price [place], [place] 11/27/11 [phone]  Medical screening examination/treatment/procedure(s) were performed by non-physician practitioner and as supervising physician I was immediately available for consultation/collaboration.  Sunnie Nielsen, MD 11/27/11 [phone]

## 2011-11-27 NOTE — ED Notes (Signed)
Crutches in hand and knee immobilzer in place, out in w/c with RN and husband.

## 2011-11-27 NOTE — Discharge Instructions (Signed)
Joint Sprain A sprain is a tear or stretch in the ligaments that hold a joint together. Severe sprains may need as long as 3-6 weeks of immobilization and/or exercises to heal completely. Sprained joints should be rested and protected. If not, they can become unstable and prone to re-injury. Proper treatment can reduce your pain, shorten the period of disability, and reduce the risk of repeated injuries. TREATMENT   Rest and elevate the injured joint to reduce pain and swelling.   Apply ice packs to the injury for 20-30 minutes every 2-3 hours for the next 2-3 days.   Keep the injury wrapped in a compression bandage or splint as long as the joint is painful or as instructed by your caregiver.   Do not use the injured joint until it is completely healed to prevent re-injury and chronic instability. Follow the instructions of your caregiver.   Long-term sprain management may require exercises and/or treatment by a physical therapist. Taping or special braces may help stabilize the joint until it is completely better.  SEEK MEDICAL CARE IF:   You develop increased pain or swelling of the joint.   You develop increasing redness and warmth of the joint.   You develop a fever.   It becomes stiff.   Your hand or foot gets cold or numb.  Document Released: 11/04/2004 Document Revised: 06/09/2011 Document Reviewed: 10/14/2008 [facility] Patient Information 2012 [place], [place].Knee Sprain You have a knee sprain. Sprains are painful injuries to the joints. A sprain is a partial or complete tearing of ligaments. Ligaments are tough, fibrous tissues that hold bones together at the joints. A strain (sprain) has occurred when a ligament is stretched or damaged. This injury may take several weeks to heal. This is often the same length of time as a bone fracture (break in bone) takes to heal. Even though a fracture (bone break) may not have occurred, the recovery times may be similar. HOME CARE INSTRUCTIONS    Rest the injured area for as long as directed by your caregiver. Then slowly start using the joint as directed by your caregiver and as the pain allows. Use crutches as directed. If the knee was splinted or casted, continue use and care as directed. If an ace bandage has been applied today, it should be removed and reapplied every 3 to 4 hours. It should not be applied tightly, but firmly enough to keep swelling down. Watch toes and feet for swelling, bluish discoloration, coldness, numbness or excessive pain. If any of these symptoms occur, remove the ace bandage and reapply more loosely.If these symptoms persist, seek medical attention.   For the first 24 hours, lie down. Keep the injured extremity elevated on two pillows.   Apply ice to the injured area for 15 to 20 minutes every couple hours. Repeat this 3 to 4 times per day for the first 48 hours. Put the ice in a plastic bag and place a towel between the bag of ice and your skin.   Wear any splinting, casting, or elastic bandage applications as instructed.   Only take over-the-counter or prescription medicines for pain, discomfort, or fever as directed by your caregiver. Do not use aspirin immediately after the injury unless instructed by your caregiver. Aspirin can cause increased bleeding and bruising of the tissues.   If you were given crutches, continue to use them as instructed. Do not resume weight bearing on the affected extremity until instructed.  Persistent pain and inability to use the injured area  as directed for more than 2 to 3 days are warning signs. If this happens you should see a caregiver for a follow-up visit as soon as possible. Initially, a hairline fracture (this is the same as a broken bone) may not be evident on x-rays. Persistent pain and swelling indicate that further evaluation, non-weight bearing (use of crutches as instructed), and/or further x-rays are indicated. X-rays may sometimes not show a small fracture until  a week or ten days later. Make a follow-up appointment with your own caregiver or one to whom we have referred you. A radiologist (specialist in reading x-rays) may re-read your X-rays. Make sure you know how you are to get your x-ray results. Do not assume everything is normal if you do not hear from [place]. SEEK MEDICAL CARE IF:   Bruising, swelling, or pain increases.   You have cold or numb toes   You have continuing difficulty or pain with walking.  SEEK IMMEDIATE MEDICAL CARE IF:   Your toes are cold, numb or blue.   The pain is not responding to medications and continues to stay the same or get worse.  MAKE SURE YOU:   Understand these instructions.   Will watch your condition.   Will get help right away if you are not doing well or get worse.  Document Released: 09/27/2005 Document Revised: 06/09/2011 Document Reviewed: 09/11/2007 [facility] Patient Information 2012 [place], [place].

## 2011-11-27 NOTE — ED Notes (Addendum)
Was at a concert tonight, developed L knee pain after going down steps, no known injury, no relief with rest, mobic, vicodin. Also pain in L shin area, behind knee and calf. "Unable to walk". Appears unremarkable. Here with family.

## 2011-11-27 NOTE — ED Notes (Signed)
Ortho tech at [facility] 

## 2011-11-29 DIAGNOSIS — S83289A Other tear of lateral meniscus, current injury, unspecified knee, initial encounter: Secondary | ICD-10-CM | POA: Diagnosis not present

## 2011-12-03 ENCOUNTER — Other Ambulatory Visit: Payer: Self-pay | Admitting: Family

## 2011-12-07 DIAGNOSIS — S83289A Other tear of lateral meniscus, current injury, unspecified knee, initial encounter: Secondary | ICD-10-CM | POA: Diagnosis not present

## 2011-12-11 ENCOUNTER — Other Ambulatory Visit: Payer: Self-pay | Admitting: Internal Medicine

## 2011-12-11 DIAGNOSIS — M25569 Pain in unspecified knee: Secondary | ICD-10-CM | POA: Diagnosis not present

## 2011-12-18 IMAGING — CR DG CHEST 2V
2 series · 2 of 2 positions shown · non-contrast
Comparison: 12/30/2010

CLINICAL DATA: Cough.  Congestion.  History sarcoidosis.

CHEST - 2 VIEW

[w chest pa]
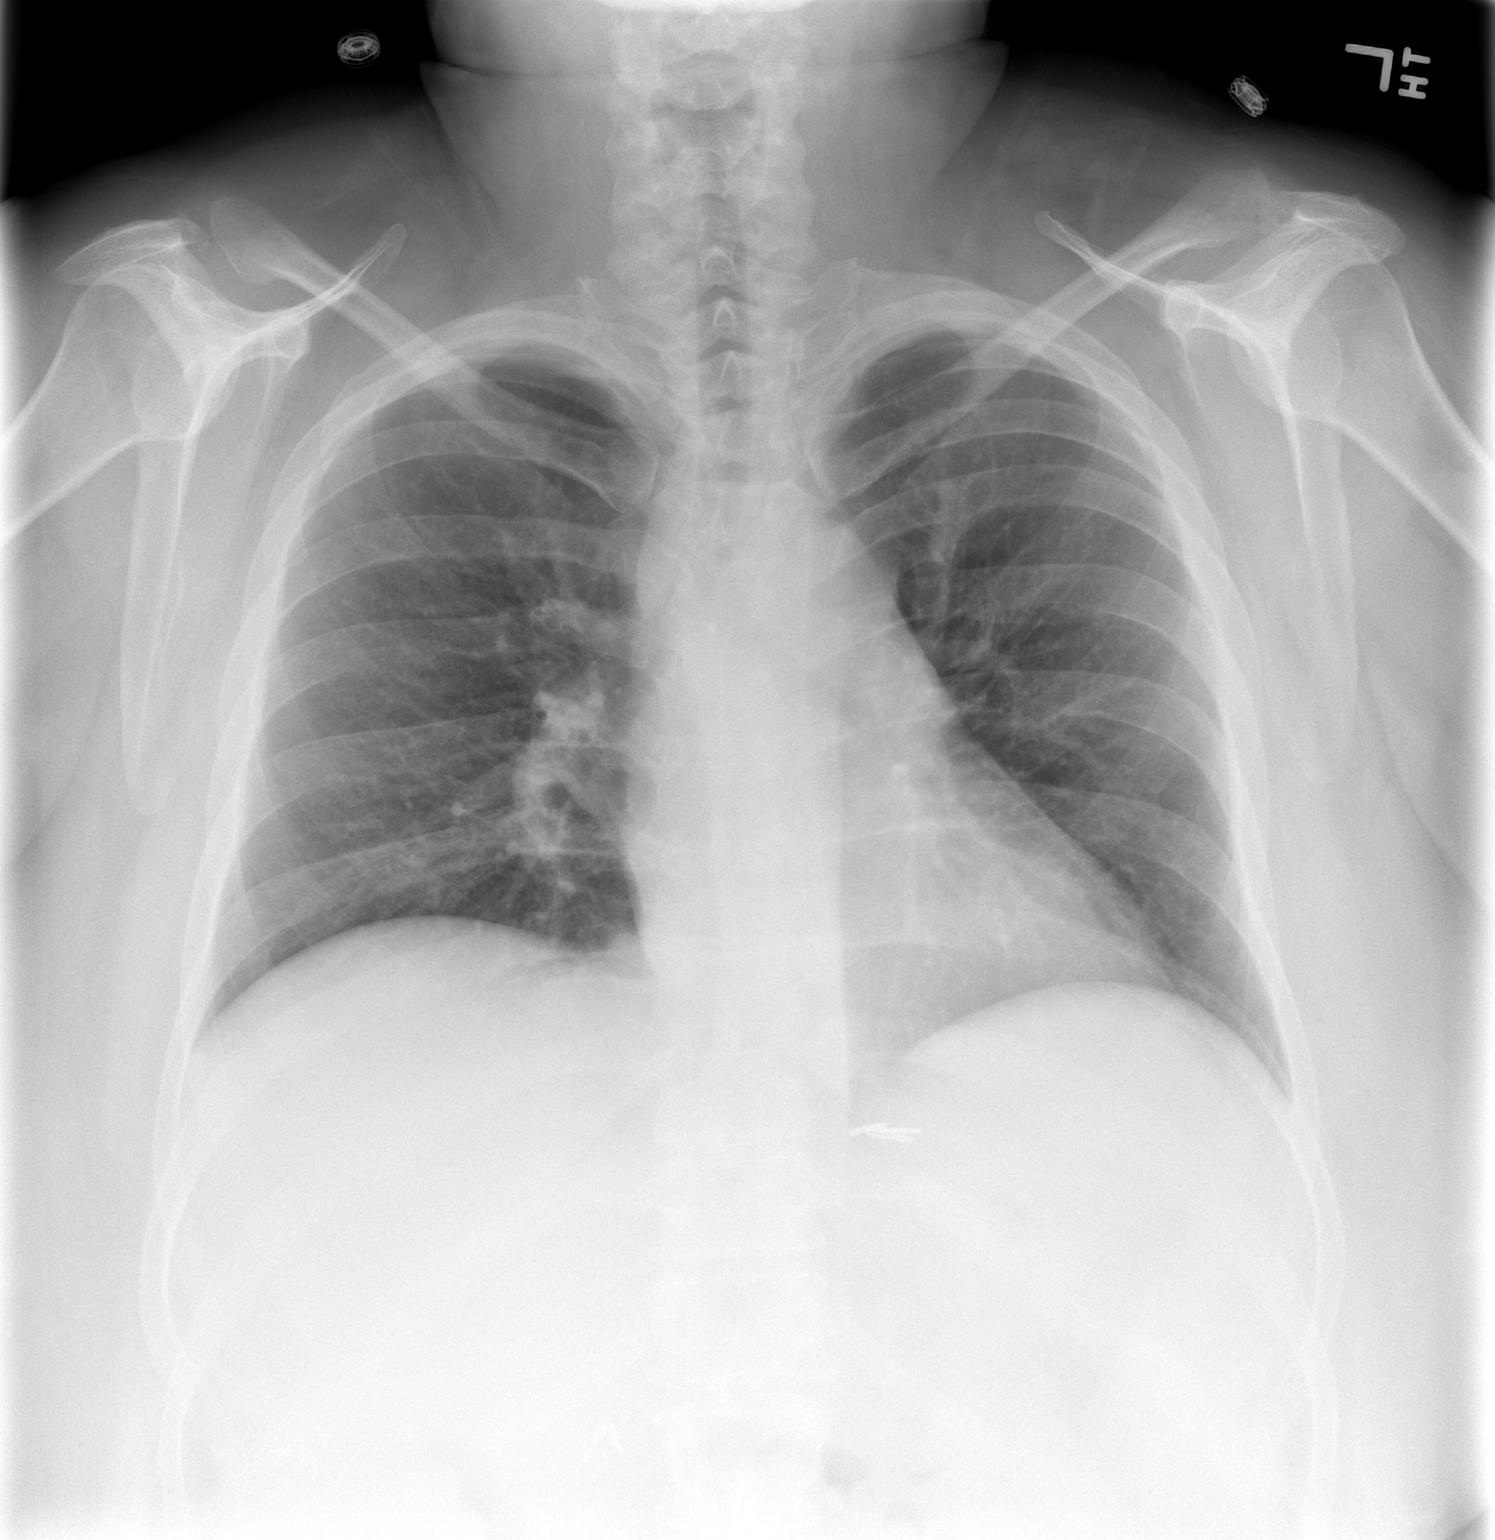

[w chest lat]
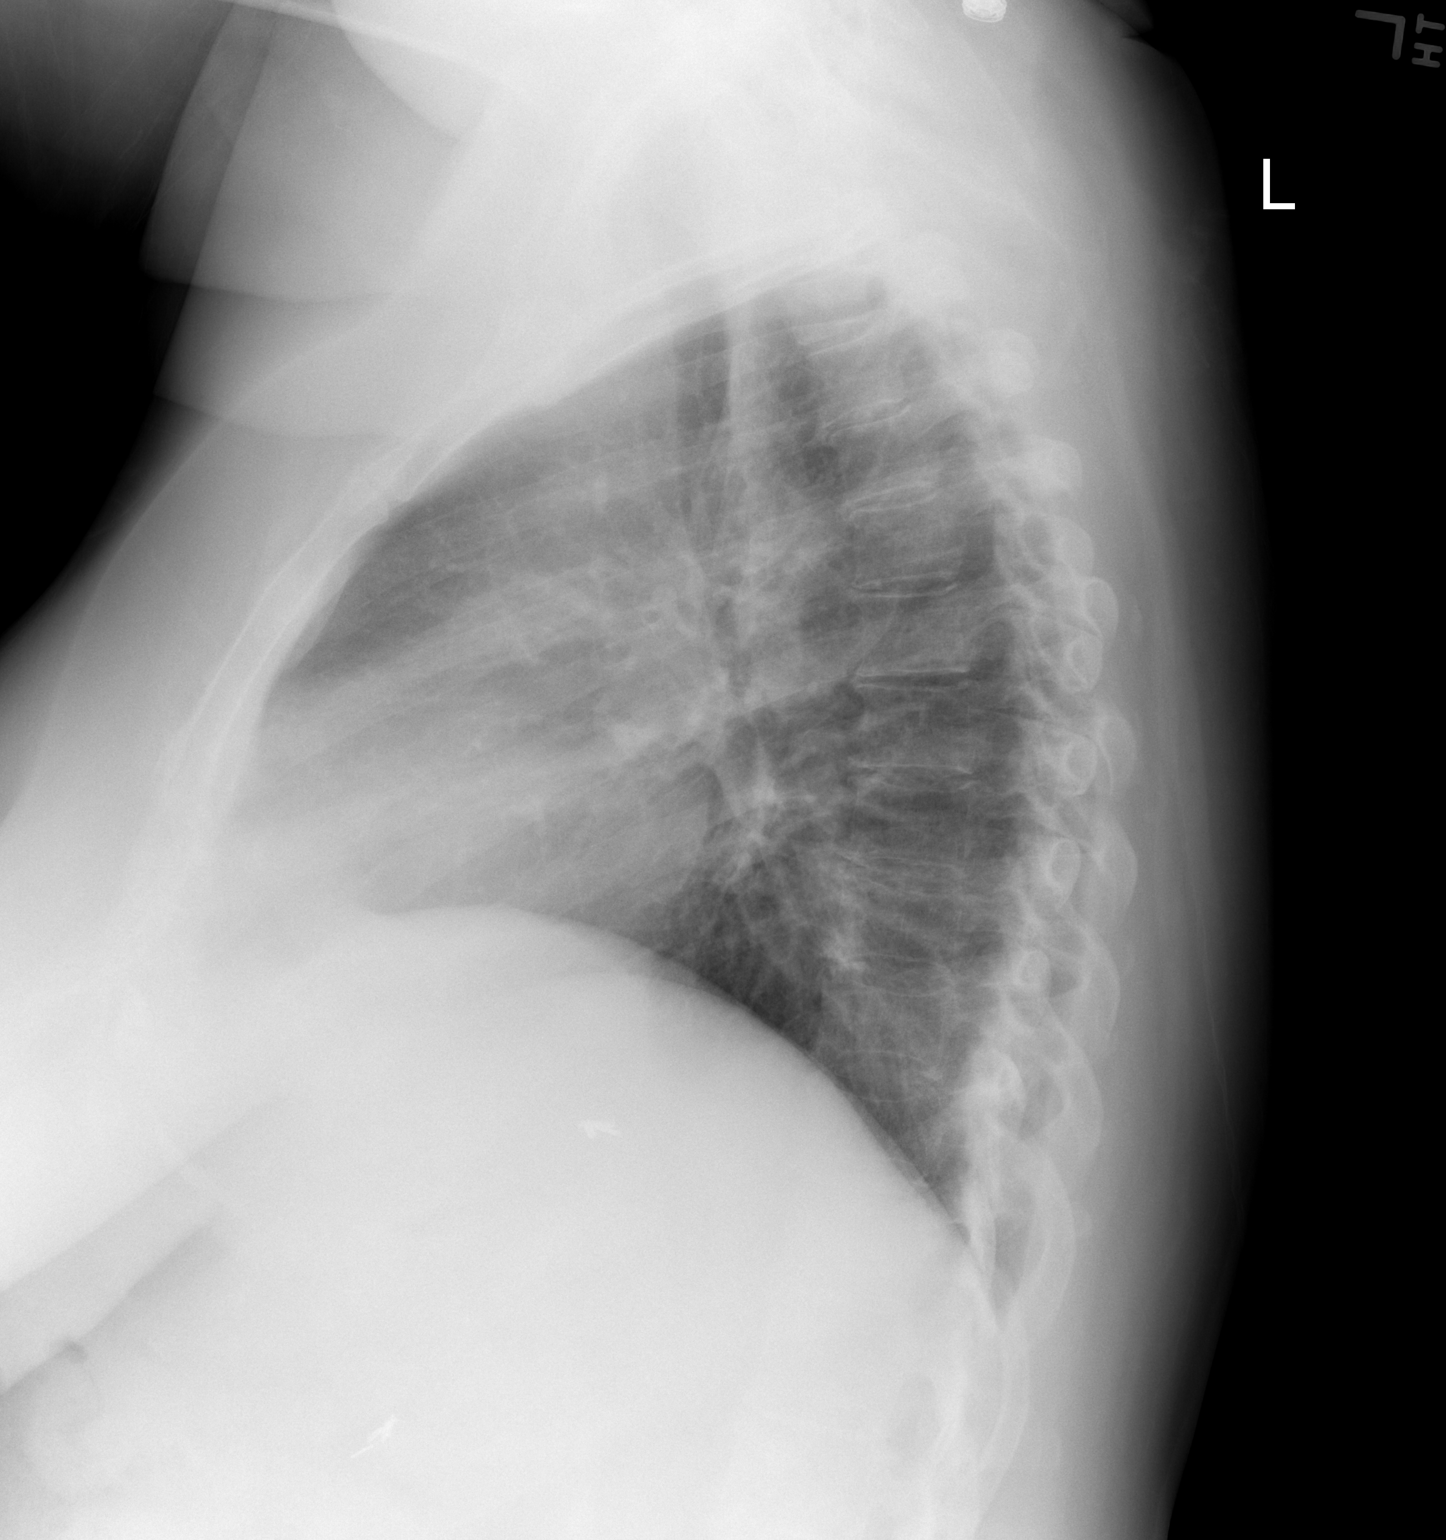

[2 of 2 positions shown; findings below may reference images not displayed]

FINDINGS: Borderline airway thickening noted.  No airspace opacity
is identified.  No pleural effusion noted.  The lungs appear
otherwise clear.

No definite adenopathy. Cardiac and mediastinal contours appear
unremarkable.
IMPRESSION: 1.  Mild airway thickening may reflect bronchitis or reactive
airways disease.

## 2011-12-24 DIAGNOSIS — S83289A Other tear of lateral meniscus, current injury, unspecified knee, initial encounter: Secondary | ICD-10-CM | POA: Diagnosis not present

## 2011-12-24 DIAGNOSIS — IMO0002 Reserved for concepts with insufficient information to code with codable children: Secondary | ICD-10-CM | POA: Diagnosis not present

## 2011-12-27 ENCOUNTER — Ambulatory Visit (INDEPENDENT_AMBULATORY_CARE_PROVIDER_SITE_OTHER): Payer: BC Managed Care – PPO | Admitting: Family

## 2011-12-27 ENCOUNTER — Encounter: Payer: Self-pay | Admitting: Family

## 2011-12-27 VITALS — BP 120/68 | Temp 98.0°F | Wt 259.0 lb

## 2011-12-27 DIAGNOSIS — R059 Cough, unspecified: Secondary | ICD-10-CM

## 2011-12-27 DIAGNOSIS — J209 Acute bronchitis, unspecified: Secondary | ICD-10-CM | POA: Diagnosis not present

## 2011-12-27 DIAGNOSIS — R05 Cough: Secondary | ICD-10-CM

## 2011-12-27 MED ORDER — PREDNISONE 20 MG PO TABS: 20.0000 mg | ORAL_TABLET | Freq: Every day | ORAL | Status: DC

## 2011-12-27 MED ORDER — AMOXICILLIN 500 MG PO TABS: 1000.0000 mg | ORAL_TABLET | Freq: Two times a day (BID) | ORAL | Status: DC

## 2011-12-27 NOTE — Progress Notes (Signed)
Subjective:    Patient ID: [name], female    DOB: November 16, 1960, 51 y.o.   MRN: [id number]  HPI 51 year old white female, nonsmoker, patient of Dr. Cato Mulligan is in today with complaints of cough, congestion, wheezing daughter-in-law for 4 days. Cough is productive of yellow phlegm. She can take an over-the-counter cough medication with no relief. As of note, her husband was treated last week for sinusitis. Patient has a past medical history sarcoidosis. She also had a knee surgery 4 days ago.   Review of Systems  Constitutional: Negative.   HENT: Positive for congestion, sneezing and postnasal drip.   Respiratory: Positive for cough and wheezing.   Cardiovascular: Negative.  Negative for chest pain.  Musculoskeletal: Negative.   Skin: Negative.   Neurological: Negative.   Hematological: Negative.   Psychiatric/Behavioral: Negative.    Past Medical History  Diagnosis Date  . Sarcoidosis 04/10/2007  . DIABETES MELLITUS, TYPE II 08/21/2006  . HYPERLIPIDEMIA 08/21/2006  . GOUT 08/20/2010  . OBESITY 06/04/2009  . ANEMIA-UNSPECIFIED 09/18/2009  . HYPERTENSION 08/21/2006  . GERD 08/21/2006  . Shortness of breath   . SLEEP APNEA 04/21/2009    last testing was negative    History   Social History  . Marital Status: Married    Spouse Name: N/A    Number of Children: N/A  . Years of Education: N/A   Occupational History  . DISABLED    Social History Main Topics  . Smoking status: Never Smoker   . Smokeless tobacco: Never Used  . Alcohol Use: No  . Drug Use: No  . Sexually Active: Yes   Other Topics Concern  . Not on file   Social History Narrative  . No narrative on file    Past Surgical History  Procedure Date  . Ventral hernia repair   . Nissen fundoplication   . Cholecystectomy   . Abdominal hysterectomy   . Knee arthroscopy     right  . Tubal ligation   . Bladder suspension 11/11/2011    Procedure: TRANSVAGINAL TAPE (TVT) PROCEDURE;  Surgeon: Levi Aland, MD;  Location: [facility];  Service: Gynecology;  Laterality: N/A;  . Cystoscopy 11/11/2011    Procedure: CYSTOSCOPY;  Surgeon: Levi Aland, MD;  Location: [facility];  Service: Gynecology;  Laterality: N/A;    Family History  Problem Relation Age of Onset  . Diabetes Father   . Heart attack Father   . Coronary artery disease Father   . Heart failure Father   . COPD Mother   . Emphysema Mother   . Asthma Mother   . Heart failure Mother   . Sarcoidosis Maternal Uncle   . Colon cancer Neg Hx   . Lung cancer Brother   . Cancer Brother     Allergies  Allergen Reactions  . Lisinopril Cough  . Methotrexate     REACTION: peri-oral and buccal lesions.    Current Outpatient Prescriptions on File Prior to Visit  Medication Sig Dispense Refill  . Adalimumab (HUMIRA PEN [place]) Inject 40 mg into the skin every 14 (fourteen) days. Injection every 2 weeks.      [name] albuterol (PROVENTIL) (2.5 MG/3ML) 0.083% nebulizer solution Take 3 mLs (2.5 mg total) by nebulization every 6 (six) hours as needed for wheezing.  150 mL  1  . allopurinol (ZYLOPRIM) 100 MG tablet Take 100 mg by mouth daily.        [name] aspirin 81 MG tablet Take 81 mg by mouth daily.        [name]  atenolol (TENORMIN) 50 MG tablet Take 50 mg by mouth daily.       [name] atorvastatin (LIPITOR) 40 MG tablet TAKE ONE TABLET BY MOUTH ONE TIME DAILY  90 tablet  1  . benzonatate (TESSALON) 100 MG capsule Take 100 mg by mouth 3 (three) times daily as needed. For cough      . buPROPion (WELLBUTRIN XL) 300 MG 24 hr tablet Take 1 tablet (300 mg total) by mouth daily.  30 tablet  5  . chlorpheniramine-HYDROcodone (TUSSIONEX) 10-8 MG/5ML LQCR Take 5 mLs by mouth every 12 (twelve) hours as needed. For cough & pain      . Cholecalciferol (GNP VITAMIN D) 1000 UNITS tablet Take 2,000 Units by mouth daily.       . ciclesonide (OMNARIS) 50 MCG/ACT nasal spray Place 1 spray into both nostrils daily.       . cloNIDine (CATAPRES) 0.1 MG tablet Take 0.1 mg by  mouth daily.       [name] DEXILANT 60 MG capsule TAKE ONE CAPSULE BY MOUTH ONE TIME DAILY  30 capsule  11  . estradiol (ESTRACE) 1 MG tablet Take 2 mg by mouth daily.       . fenofibrate (TRICOR) 145 MG tablet Take 145 mg by mouth daily.        . furosemide (LASIX) 20 MG tablet Take 1 tablet (20 mg total) by mouth daily.  30 tablet  5  . glucose blood (FREESTYLE LITE) test strip Use as instructed  100 each  12  . LORazepam (ATIVAN) 1 MG tablet Take 1 mg by mouth 2 (two) times daily as needed. For anxiety      . losartan (COZAAR) 50 MG tablet Take 50 mg by mouth daily.        . metFORMIN (GLUCOPHAGE) 500 MG tablet Take 1 tablet (500 mg total) by mouth 2 (two) times daily with a meal.  60 tablet  5  . nitroGLYCERIN (NITROSTAT) 0.4 MG SL tablet Place 0.4 mg under the tongue every 5 (five) minutes as needed. For chest pain      . potassium chloride SA (K-DUR,KLOR-CON) 20 MEQ tablet Take 1 tablet (20 mEq total) by mouth 3 (three) times daily.  90 tablet  5  . predniSONE (DELTASONE) 10 MG tablet Take 10 mg by mouth daily.      . predniSONE (DELTASONE) 20 MG tablet TAKE THREE TABLETS BY MOUTH EVERY MORNING  30 tablet  0  . venlafaxine (EFFEXOR-XR) 75 MG 24 hr capsule Take 75 mg by mouth 2 (two) times daily.         BP 120/68  Temp(Src) 98 F (36.7 C) (Oral)  Wt 259 lb (117.482 kg)chart    Objective:   Physical Exam  Constitutional: She is oriented to person, place, and time. She appears well-developed and well-nourished.  HENT:  Right Ear: External ear normal.  Left Ear: External ear normal.  Nose: Nose normal.  Mouth/Throat: Oropharynx is clear and moist.  Neck: Normal range of motion. Neck supple.  Cardiovascular: Normal rate, regular rhythm and normal heart sounds.   Pulmonary/Chest: Effort normal. She has wheezes.  Musculoskeletal: Normal range of motion.  Neurological: She is alert and oriented to person, place, and time.  Skin: Skin is warm and dry.  Psychiatric: She has a normal mood  and affect.          Assessment & Plan:  Assessment: Bronchitis, cough  Plan: Prednisone 40 mg once a day x5 days. Hold her 5  mg dosage that she takes on a daily basis. Amoxicillin 500 mg 2 caps by mouth twice a day x10 days. Rest. Drink plenty of fluids. Call the office if symptoms worsen or persist, recheck as scheduled or when necessary.

## 2011-12-27 NOTE — Patient Instructions (Signed)
Bronchitis Bronchitis is the body's way of reacting to injury and/or infection (inflammation) of the bronchi. Bronchi are the air tubes that extend from the windpipe into the lungs. If the inflammation becomes severe, it may cause shortness of breath. CAUSES  Inflammation may be caused by:  A virus.   Germs (bacteria).   Dust.   Allergens.   Pollutants and many other irritants.  The cells lining the bronchial tree are covered with tiny hairs (cilia). These constantly beat upward, away from the lungs, toward the mouth. This keeps the lungs free of pollutants. When these cells become too irritated and are unable to do their job, mucus begins to develop. This causes the characteristic cough of bronchitis. The cough clears the lungs when the cilia are unable to do their job. Without either of these protective mechanisms, the mucus would settle in the lungs. Then you would develop pneumonia. Smoking is a common cause of bronchitis and can contribute to pneumonia. Stopping this habit is the single most important thing you can do to help yourself. TREATMENT   Your caregiver may prescribe an antibiotic if the cough is caused by bacteria. Also, medicines that open up your airways make it easier to breathe. Your caregiver may also recommend or prescribe an expectorant. It will loosen the mucus to be coughed up. Only take over-the-counter or prescription medicines for pain, discomfort, or fever as directed by your caregiver.   Removing whatever causes the problem (smoking, for example) is critical to preventing the problem from getting worse.   Cough suppressants may be prescribed for relief of cough symptoms.   Inhaled medicines may be prescribed to help with symptoms now and to help prevent problems from returning.   For those with recurrent (chronic) bronchitis, there may be a need for steroid medicines.  SEEK IMMEDIATE MEDICAL CARE IF:   During treatment, you develop more pus-like mucus  (purulent sputum).   You have a fever.   Your baby is older than 3 months with a rectal temperature of 102 F (38.9 C) or higher.   Your baby is 3 months old or younger with a rectal temperature of 100.4 F (38 C) or higher.   You become progressively more ill.   You have increased difficulty breathing, wheezing, or shortness of breath.  It is necessary to seek immediate medical care if you are elderly or sick from any other disease. MAKE SURE YOU:   Understand these instructions.   Will watch your condition.   Will get help right away if you are not doing well or get worse.  Document Released: 09/27/2005 Document Revised: 09/16/2011 Document Reviewed: 08/06/2008 [employer] Patient Information 2012 [employer]. 

## 2011-12-28 IMAGING — CR DG ABDOMEN ACUTE W/ 1V CHEST
4 series · 4 of 4 positions shown · non-contrast
Comparison: Chest and acute abdomen of 10/28/2009

CLINICAL DATA: Mid abdominal pain since yesterday, history of
sarcoidosis

ACUTE ABDOMEN SERIES (ABDOMEN 2 VIEW & CHEST 1 VIEW)

[w chest pa]
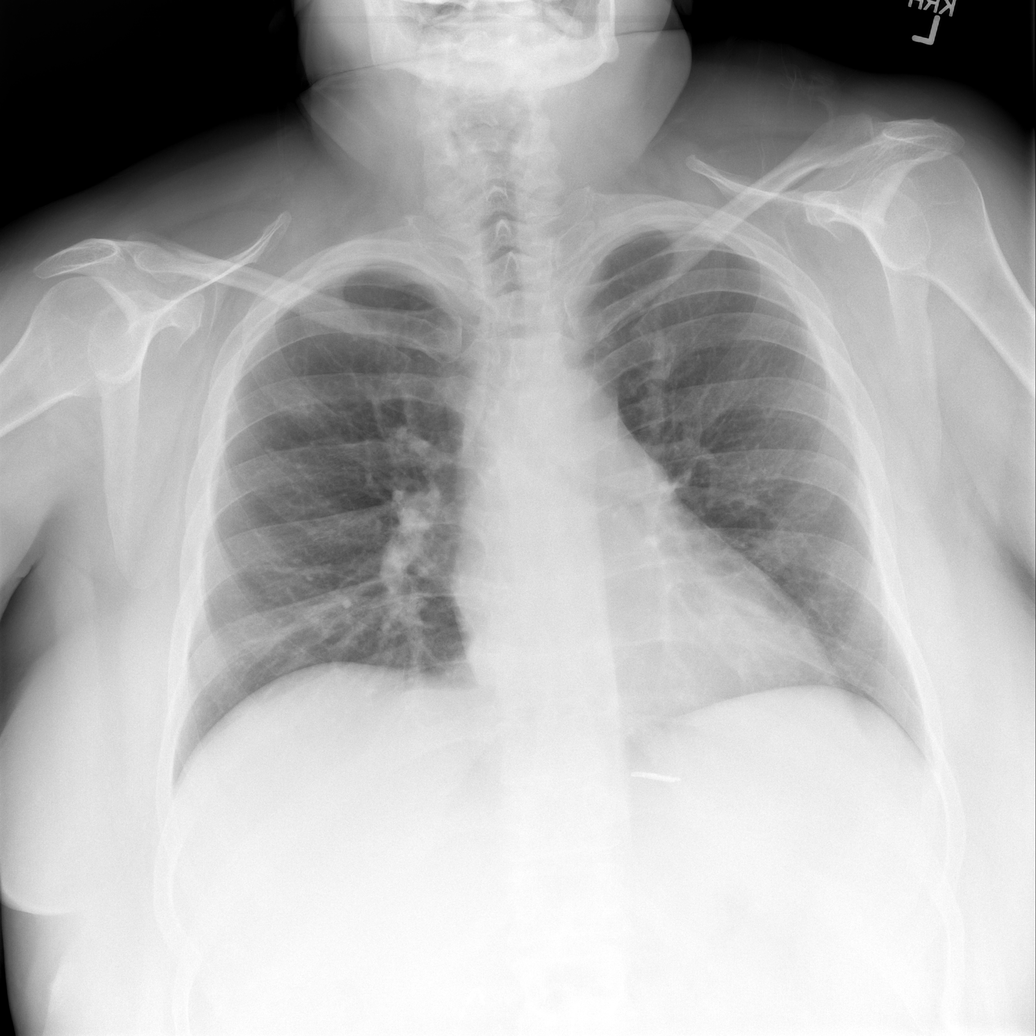

[w abdomen upright]
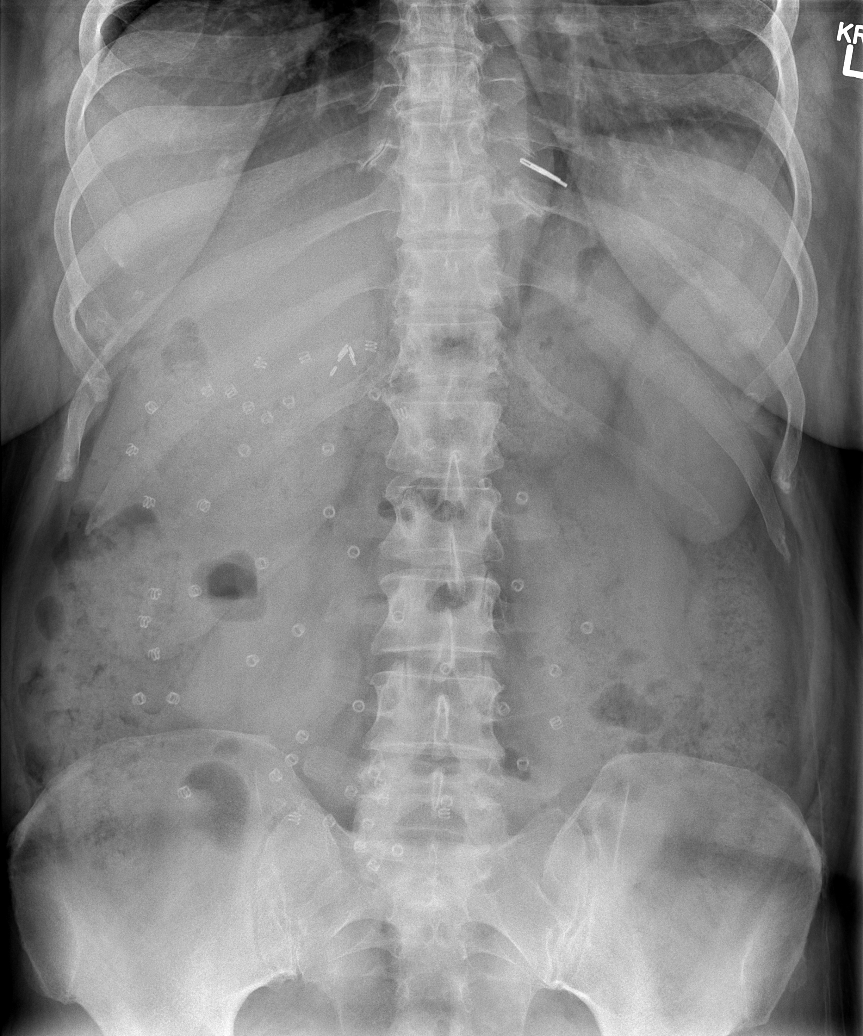

[t abdomen supine (1 of 2)]
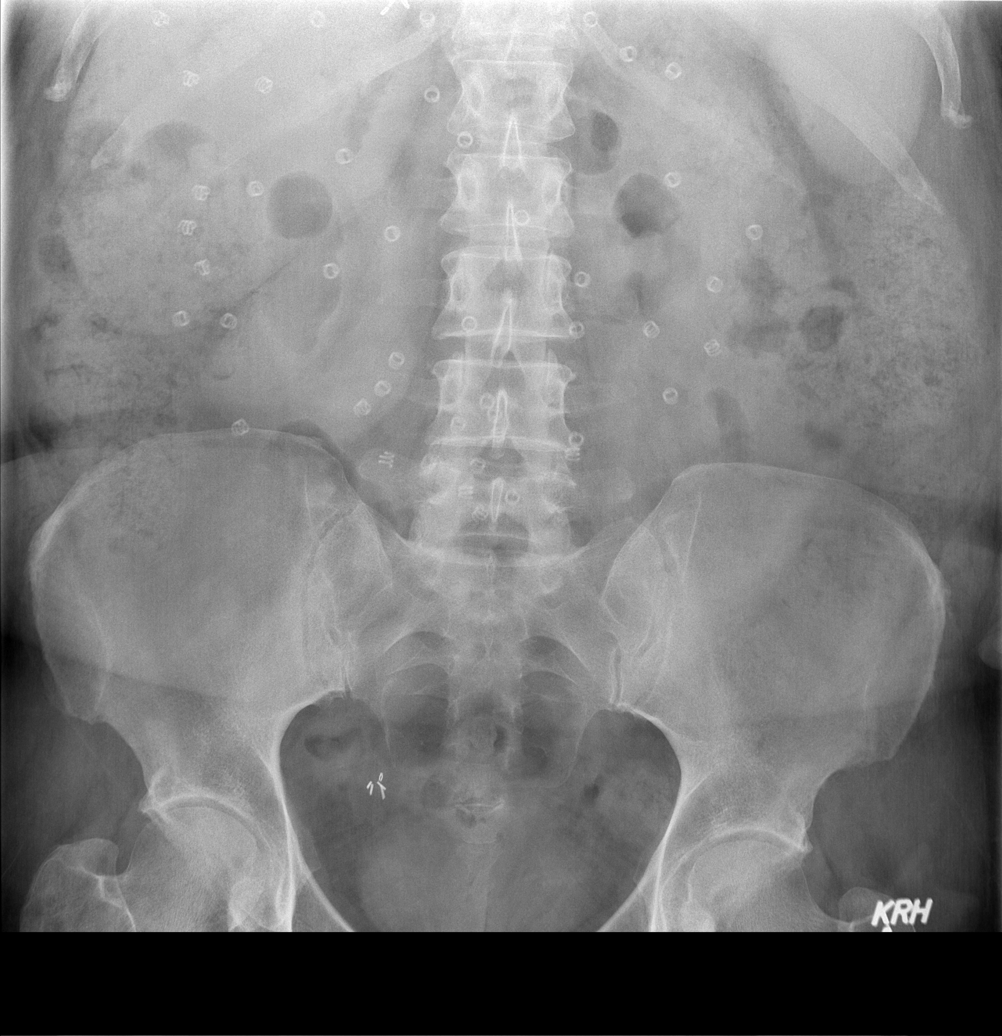

[t abdomen supine (2 of 2)]
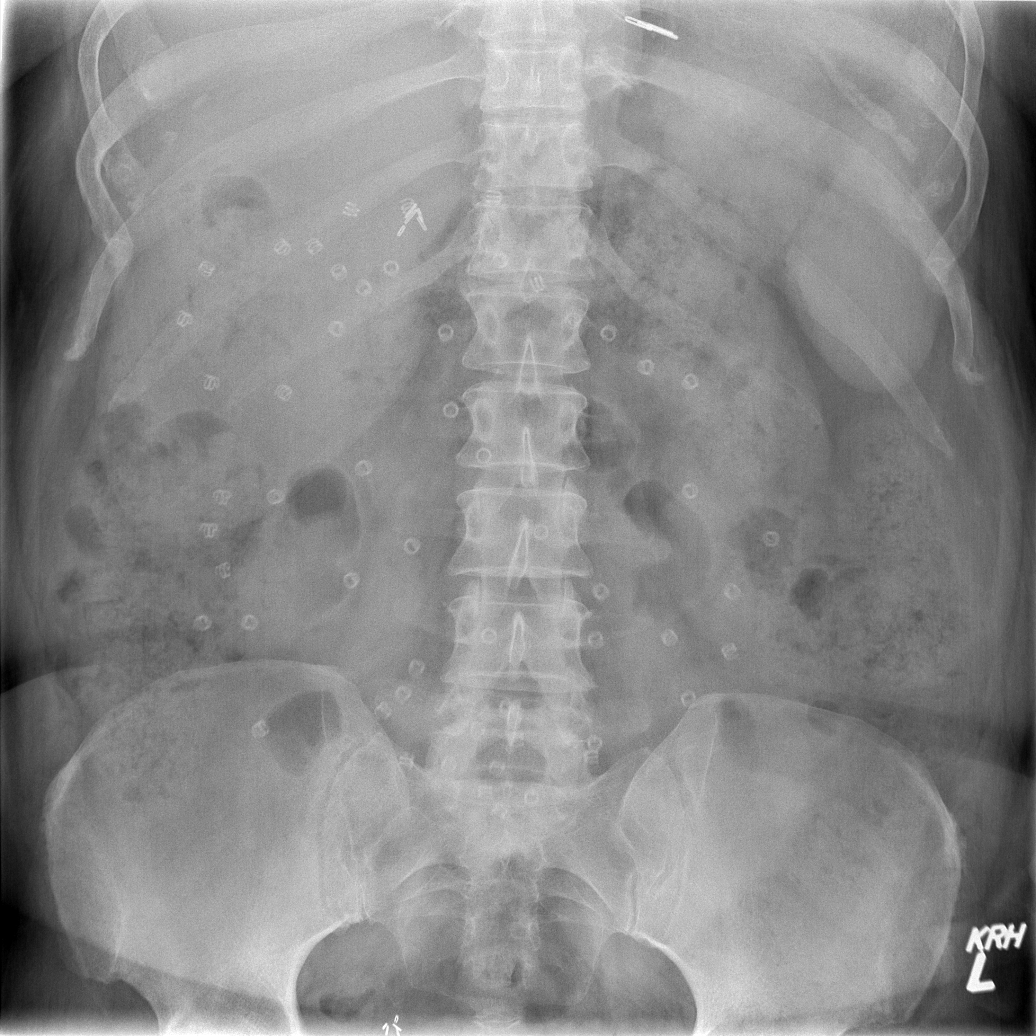

[4 of 4 positions shown; findings below may reference images not displayed]

FINDINGS: The lungs are clear.  The heart is within upper limits of
normal.  Mediastinal contours are stable.  A surgical clip overlies
the gastroesophageal junction.

Supine and erect views of the abdomen show a moderate amount of
feces throughout the entire colon.  No bowel obstruction is seen.
No free air is noted.  Coils from surgical mesh overlies the mid
abdomen.  No opaque calculi are seen.
IMPRESSION: 1.  Moderate to large amount of feces throughout the colon.  No
bowel obstruction or free air.
2.  No active lung disease.

## 2011-12-28 IMAGING — CT CT HEAD W/O CM
1 series · 16 of 30 positions shown, 20 images · non-contrast
Comparison: CT 07/14/2009

CLINICAL DATA: Headache and confusion

CT HEAD WITHOUT CONTRAST
TECHNIQUE: Contiguous axial images were obtained from the base of
the skull through the vertex without contrast.

[Series 2: head 4.8 h37s · axial · 0.45mm/px · z∈[-47,+105]mm · 16 of 36 slices shown, 20 images]
[im 2/36  brain]
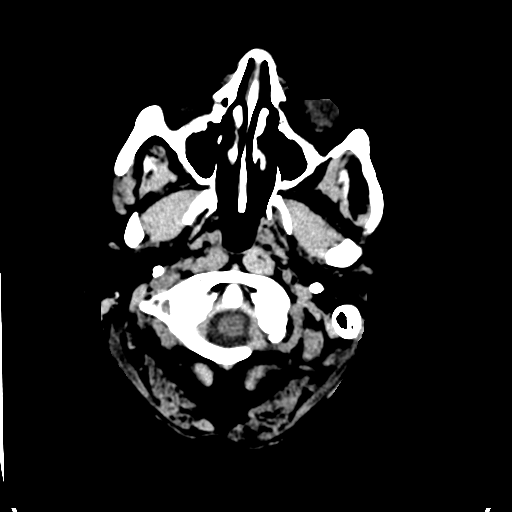
[im 2/36  bone]
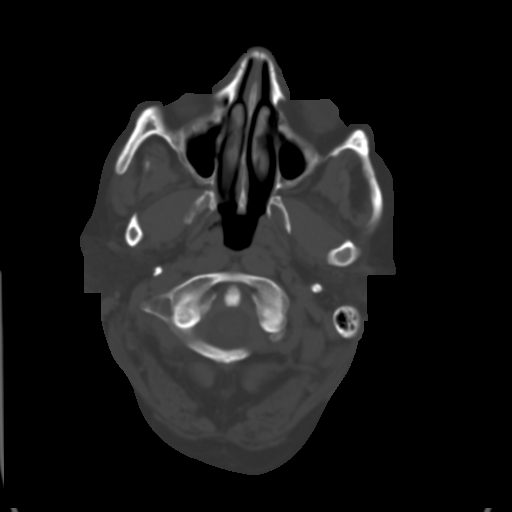
[im 4/36  brain]
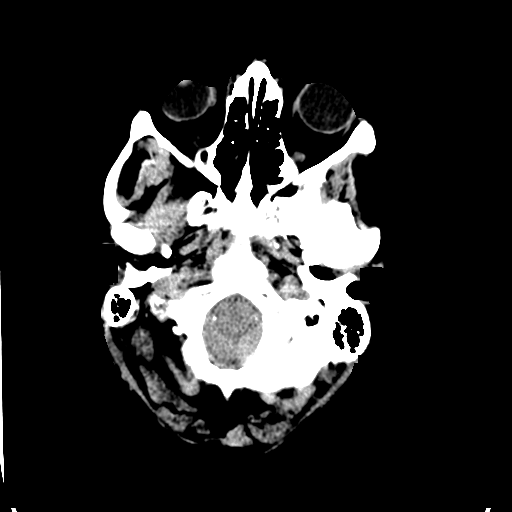
[im 7/36  brain]
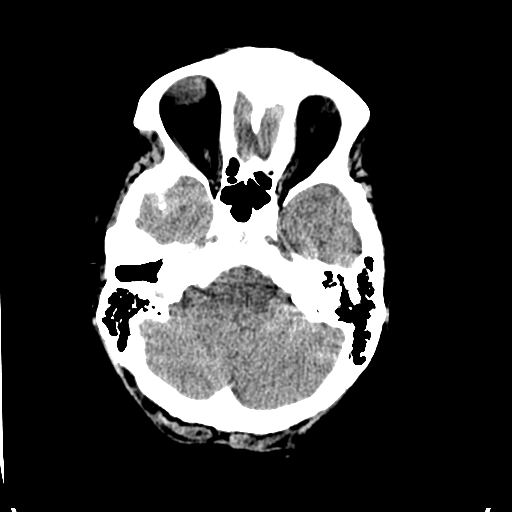
[im 9/36  brain]
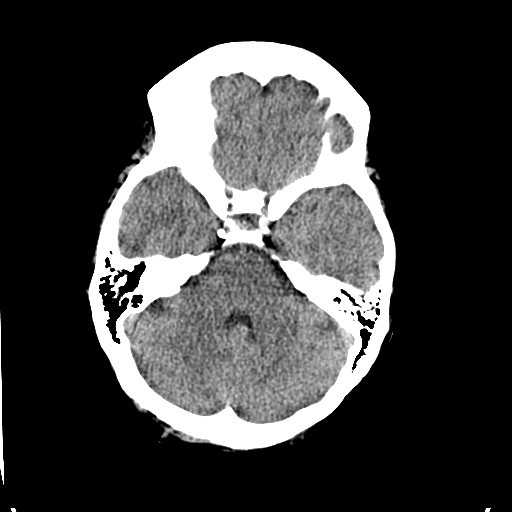
[im 10/36  brain]
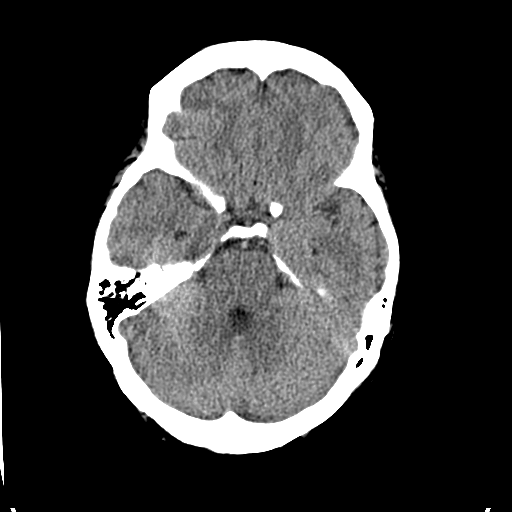
[im 10/36  bone]
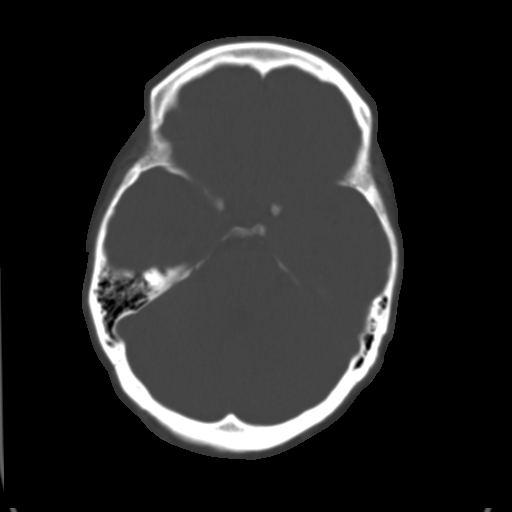
[im 13/36  brain]
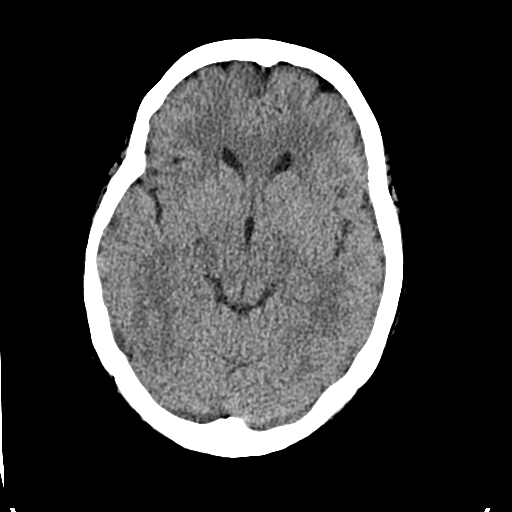
[im 15/36  brain]
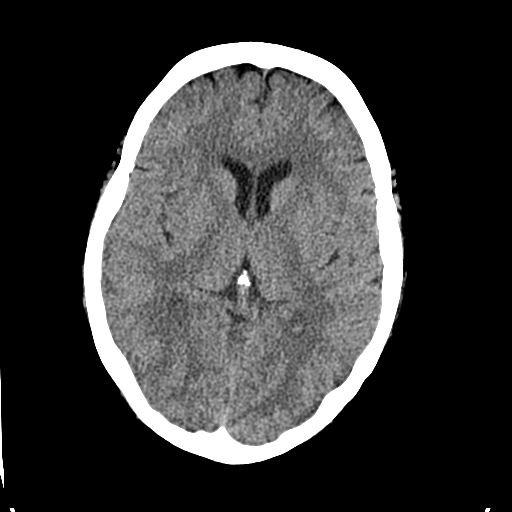
[im 17/36  brain]
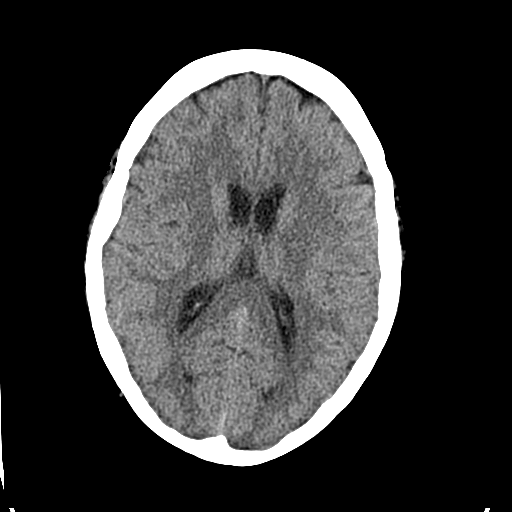
[im 19/36  brain]
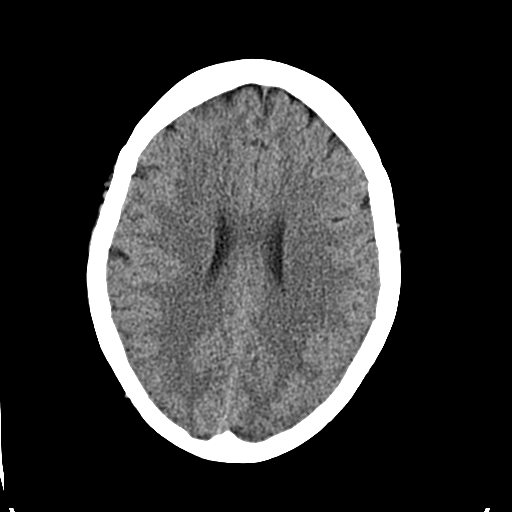
[im 19/36  bone]
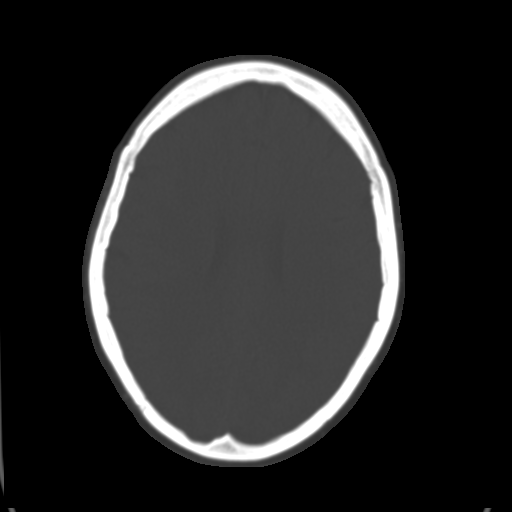
[im 21/36  brain]
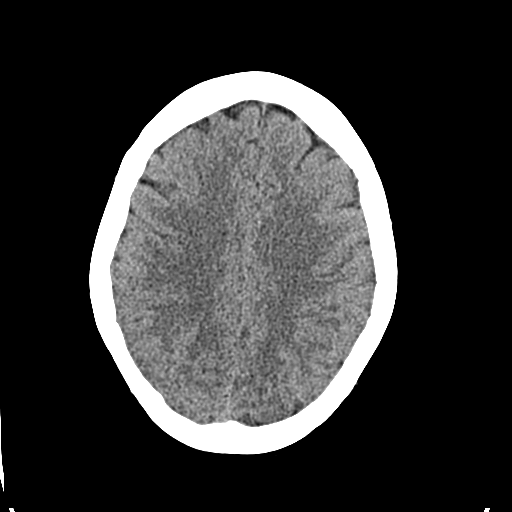
[im 23/36  brain]
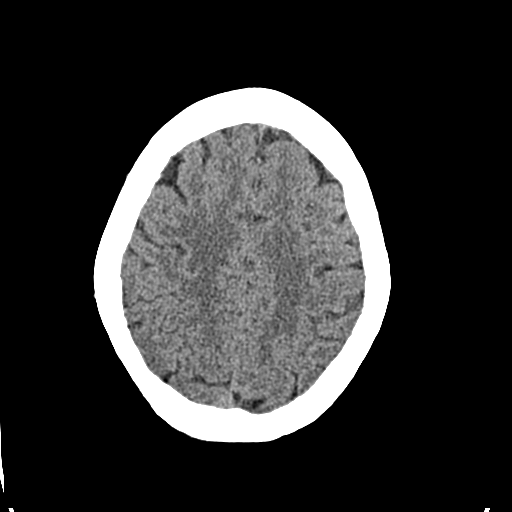
[im 26/36  brain]
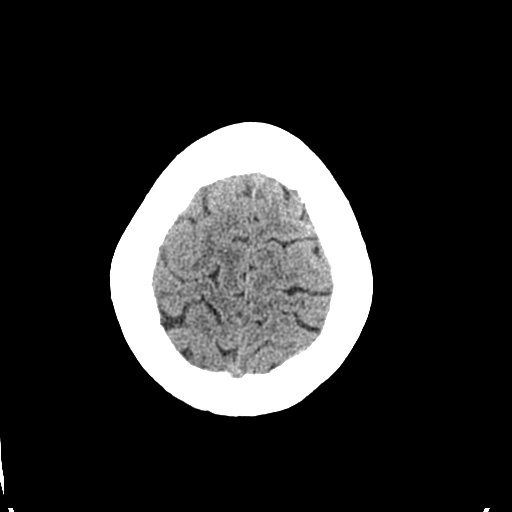
[im 27/36  brain]
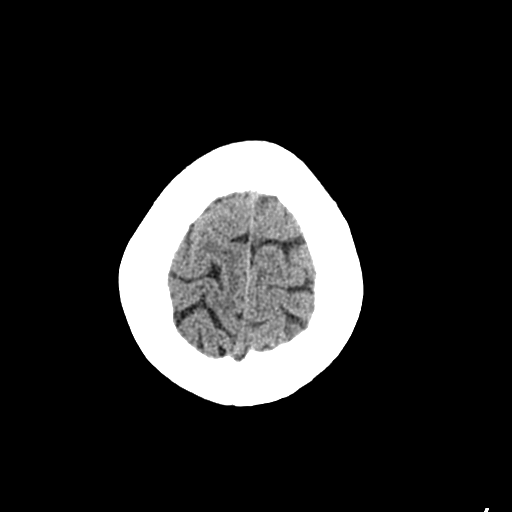
[im 27/36  bone]
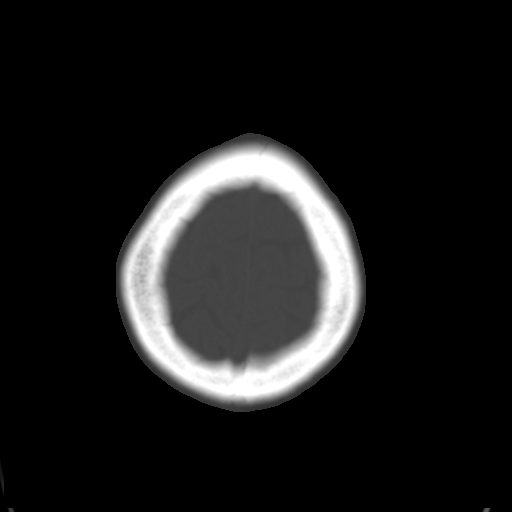
[im 29/36  brain]
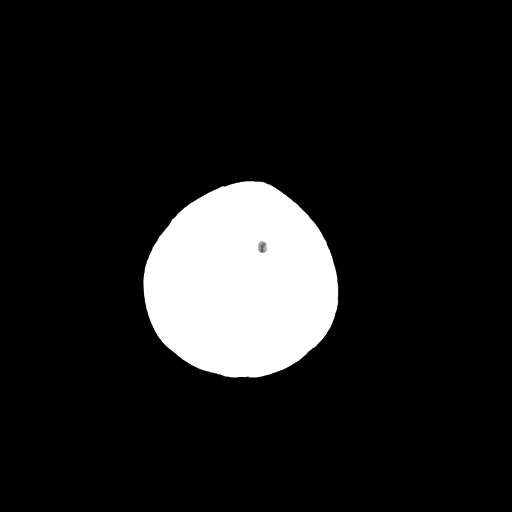
[im 32/36  brain]
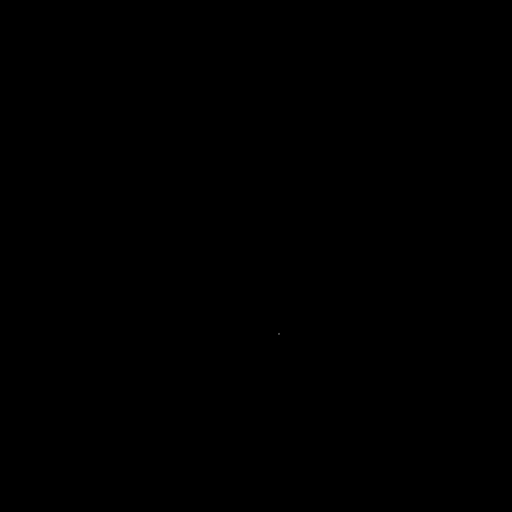
[im 34/36  brain]
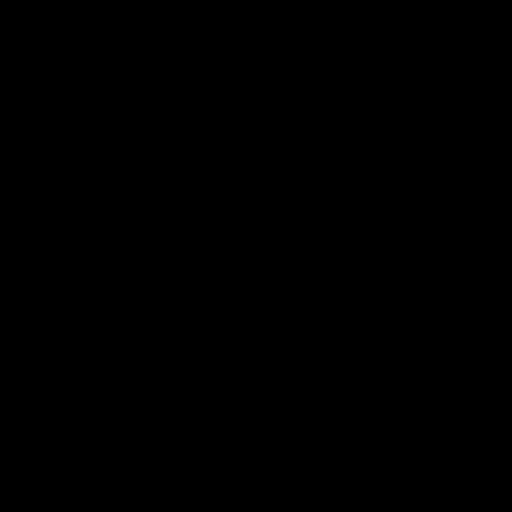

[16 of 30 positions shown; findings below may reference images not displayed]

FINDINGS: Ventricle size is normal.  Negative for acute or chronic
infarct.  Negative for mass or edema.  Negative for hemorrhage.
Calvarium is intact.
IMPRESSION: Normal

## 2011-12-29 IMAGING — MR MR HEAD W/O CM
7 of 9 series · 25 of 48 positions shown · IV contrast (Yes)
Comparison: Head CTs 02/17/2011 and earlier.  Brain MRI 06/04/2008.
COMPARISON: None.

CLINICAL DATA: 49-year-old female with sudden onset pain, nausea,
diaphoresis, slurred speech, confusion.

MRI HEAD WITHOUT CONTRAST
TECHNIQUE: Multiplanar, multiecho pulse sequences of the brain and
surrounding structures were obtained according to standard protocol
without intravenous contrast.
TECHNIQUE: Angiographic images of the Circle of Willis were
obtained using MRA technique without  intravenous contrast.

[Series 3: T1 · sagittal · 5.0mm · 0.47mm/px · 2 of 23 slices shown]
[im 1/23]
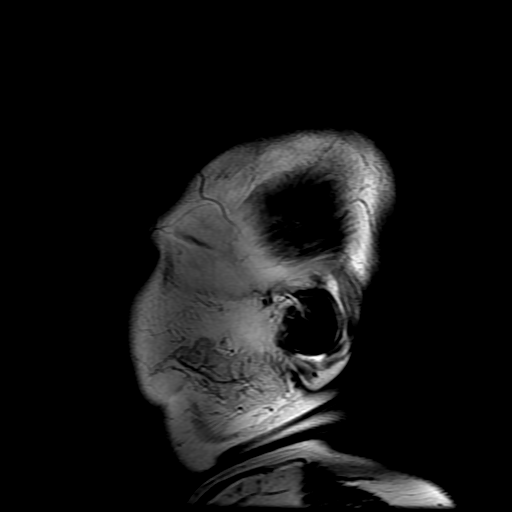
[im 23/23]
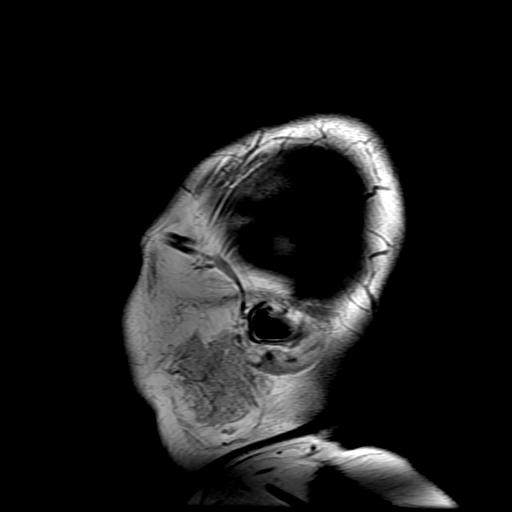

[Series 4: DWI · axial · 5.0mm · 1.09mm/px · z∈[-12,+133]mm · 6 of 60 slices shown (1 of 2)]
[im 1/60]
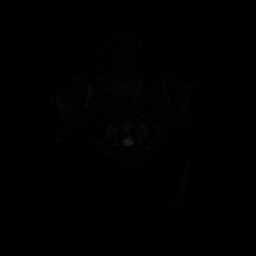
[im 12/60]
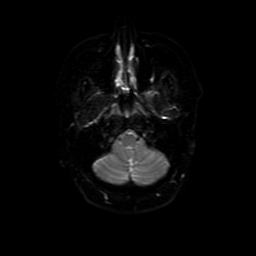
[im 24/60]
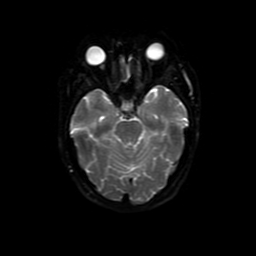
[im 36/60]
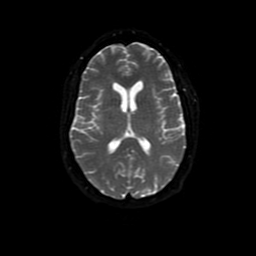
[im 48/60]
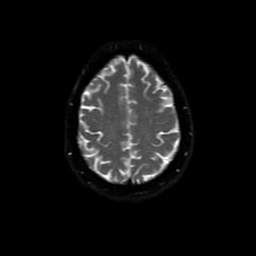
[im 60/60]
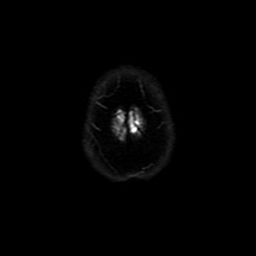

[Series 5: (id) mt fs · axial · 1.4mm · 0.39mm/px · z∈[-11,+25]mm · 4 of 139 slices shown]
[im 9/139]
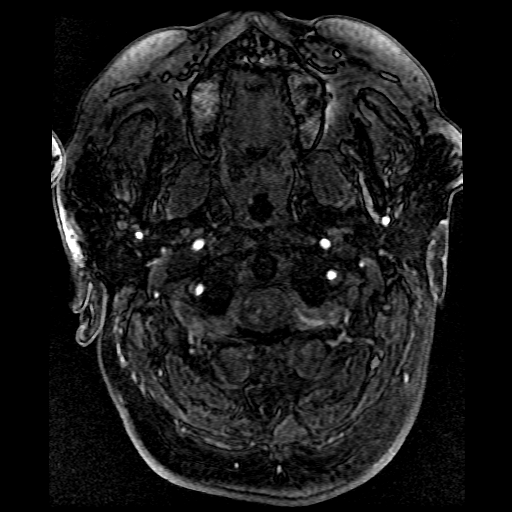
[im 26/139]
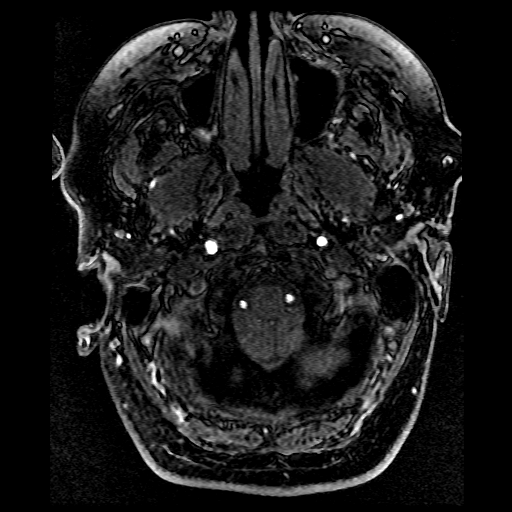
[im 44/139]
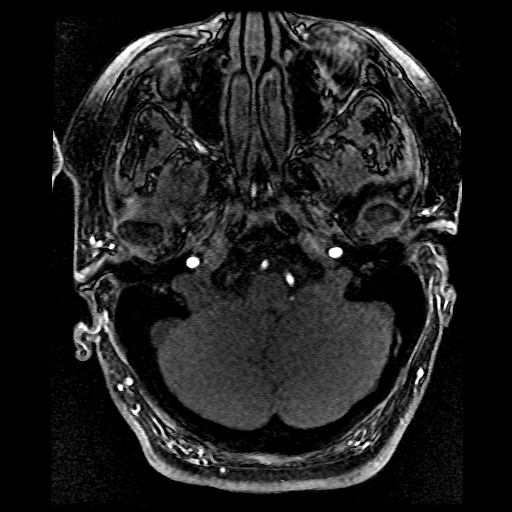
[im 61/139]
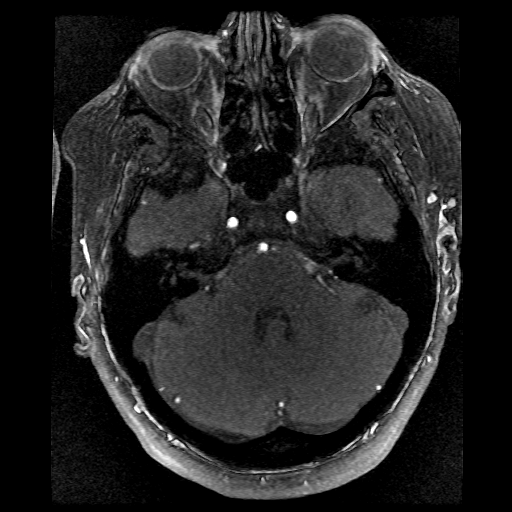

[Series 6: T2 · axial · 5.0mm · 0.45mm/px · z∈[-10,+133]mm · 3 of 23 slices shown (1 of 2)]
[im 1/23]
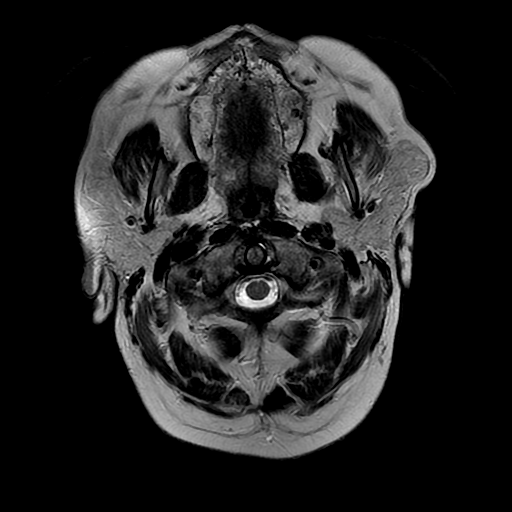
[im 12/23]
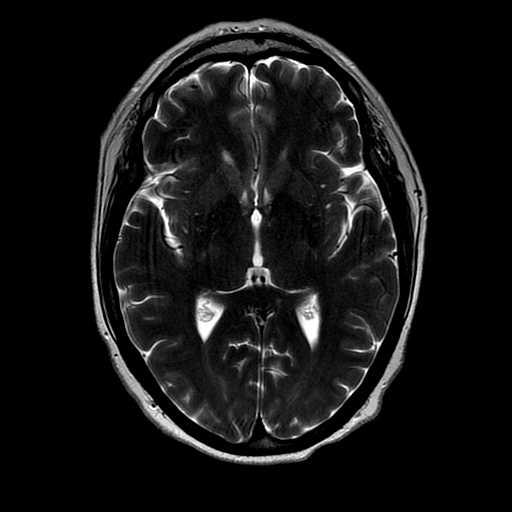
[im 23/23]
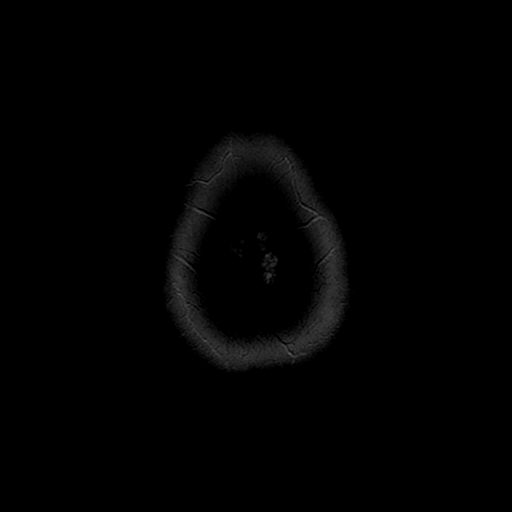

[Series 7: FLAIR · axial · 5.0mm · 0.45mm/px · z∈[-19,+127]mm · 3 of 22 slices shown]
[im 1/22]
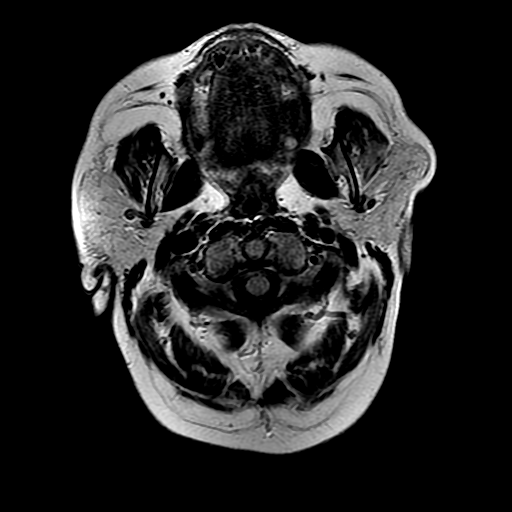
[im 11/22]
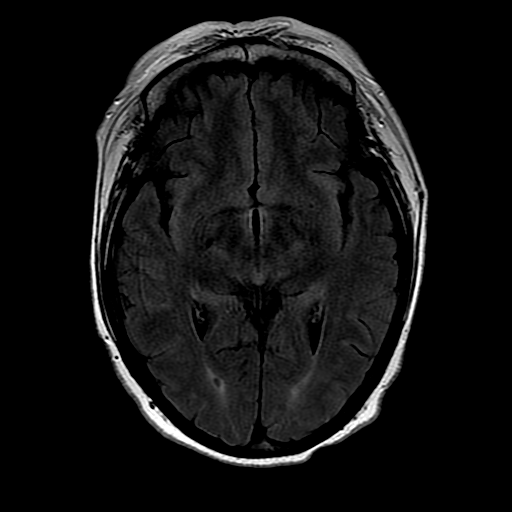
[im 22/22]
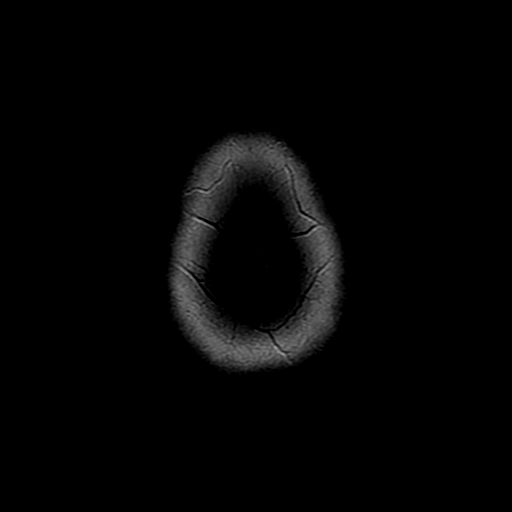

[Series 10: T2 · coronal · 5.0mm · 0.45mm/px · 3 of 27 slices shown (2 of 2)]
[im 1/27]
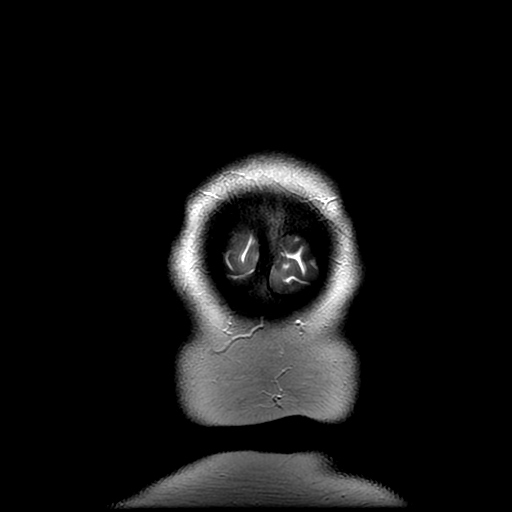
[im 14/27]
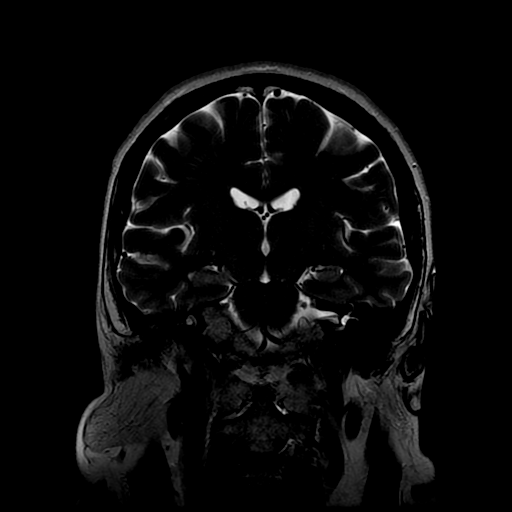
[im 27/27]
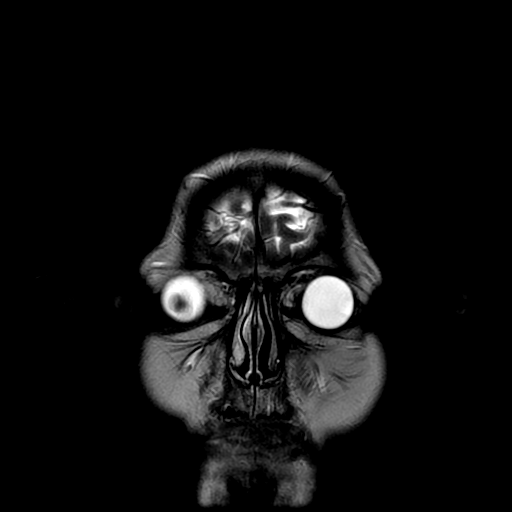

[Series 400: DWI · axial · 5.0mm · 1.09mm/px · z∈[-12,+133]mm · 4 of 30 slices shown (2 of 2)]
[im 1/30]
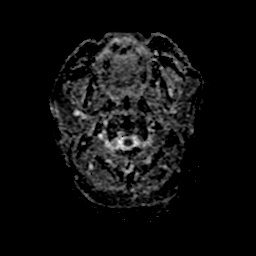
[im 10/30]
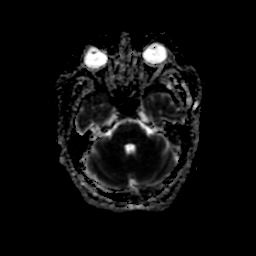
[im 20/30]
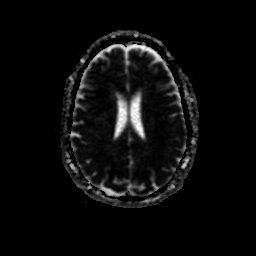
[im 30/30]
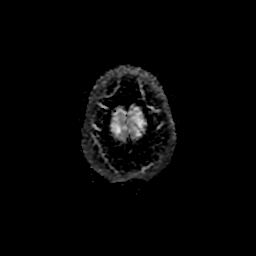

[25 of 48 positions shown; findings below may reference images not displayed]

FINDINGS: No restricted diffusion to suggest acute infarction.  No
midline shift, mass effect, evidence of mass lesion,
ventriculomegaly, extra-axial collection or acute intracranial
hemorrhage.  Cervicomedullary junction and pituitary are within
normal limits.  Major intracranial vascular flow voids are
preserved.

Stable, normal for age gray-white matter signal throughout the
brain.  Normal cerebral volume.  Negative visualized cervical
spine.

Mild heterogeneity of bone marrow signal in the clivus is similar
to the prior study, differences may be related to imaging
technique.  Overall bone marrow signal is within normal limits.
Tiny chronic mucous retention cyst in the left maxillary sinus.
Other Visualized paranasal sinuses and mastoids are clear.
Visualized orbits and scalp soft tissues are within normal limits.
IMPRESSION: Stable, negative noncontrast MRI appearance of the brain for age.
MRA findings are below.

MRA HEAD WITHOUT CONTRAST
FINDINGS: Antegrade flow in the posterior circulation codominant
distal vertebral arteries.  Normal left PICA.  Dominant right AICA.
Normal vertebrobasilar junction, basilar artery, superior
cerebellar arteries and PCA origins.  Bilateral PCA branches within
normal limits.  Normal right posterior communicating artery, the
left is diminutive or absent.

Antegrade flow in both ICA siphons which are mildly irregular but
without hemodynamically significant stenosis.  Normal ophthalmic
and right posterior communicating artery origins.  Normal carotid
termini, MCA and ACA origins.  Diminutive anterior communicating
artery.  Visualized ACA branches are within normal limits.
Visualized bilateral MCA branches are within normal limits.
IMPRESSION: Negative intracranial MRA.

## 2011-12-29 IMAGING — CT CT ABD-PELV W/ CM
1 of 2 series · 15 of 32 positions shown, 19 images · IV contrast (agent unspecified)
Comparison: 03/15/2010, 09/10/2009

CLINICAL DATA: Abdominal pain, nausea, prior hernia repair

CT ABDOMEN AND PELVIS WITH CONTRAST
TECHNIQUE: Multidetector CT imaging of the abdomen and pelvis was
performed following the standard protocol during bolus
administration of intravenous contrast.
Contrast: 100 ml [id number]

[Series 2: rtn ap with st · axial · 0.79mm/px · z∈[+228,+693]mm · 15 of 103 slices shown, 19 images]
[im 5/103  soft-tissue]
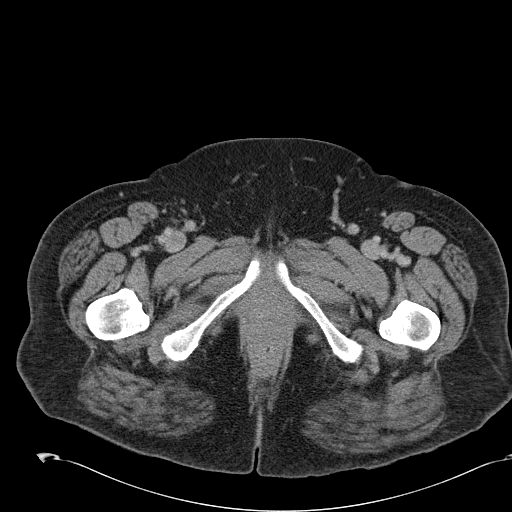
[im 5/103  bone]
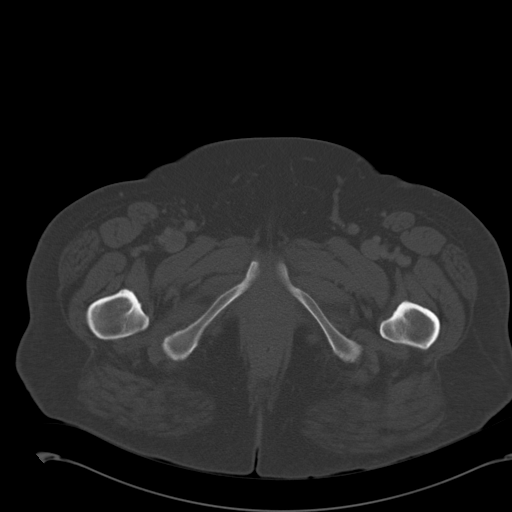
[im 14/103  soft-tissue]
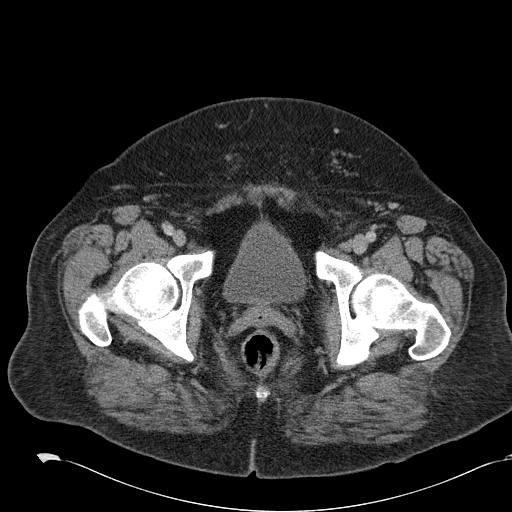
[im 23/103  soft-tissue]
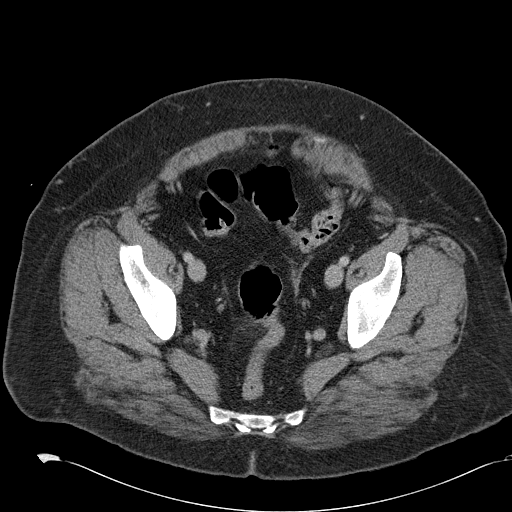
[im 27/103  soft-tissue]
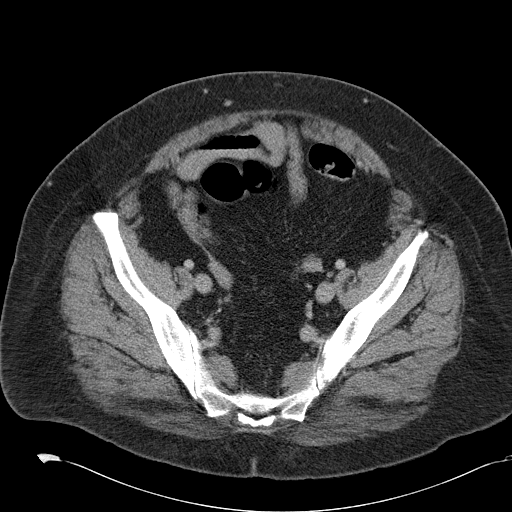
[im 36/103  soft-tissue]
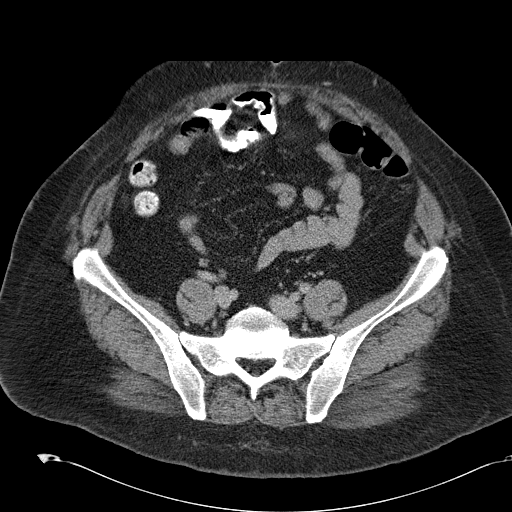
[im 45/103  soft-tissue]
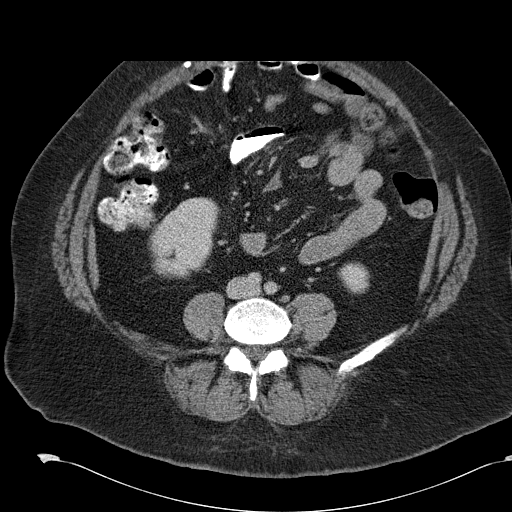
[im 54/103  soft-tissue]
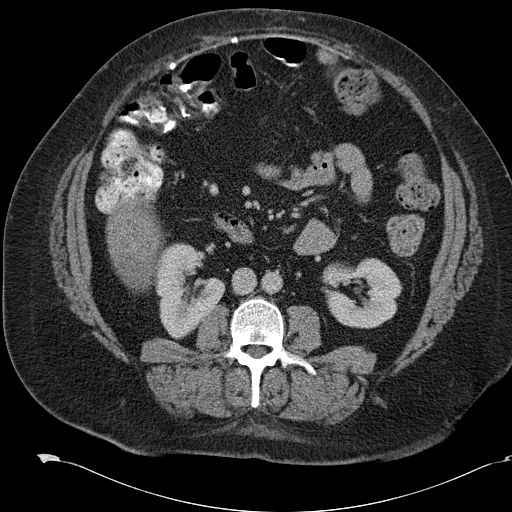
[im 58/103  soft-tissue]
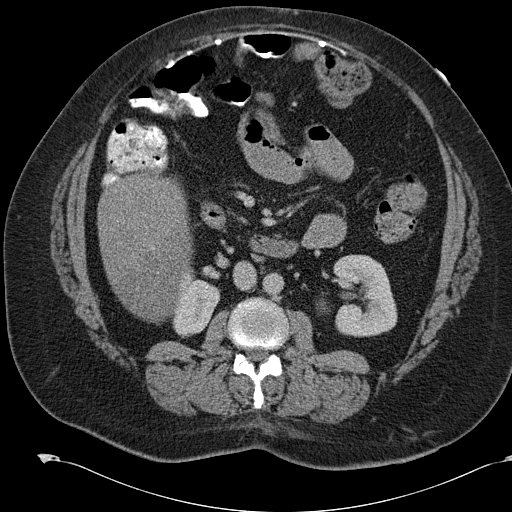
[im 67/103  soft-tissue]
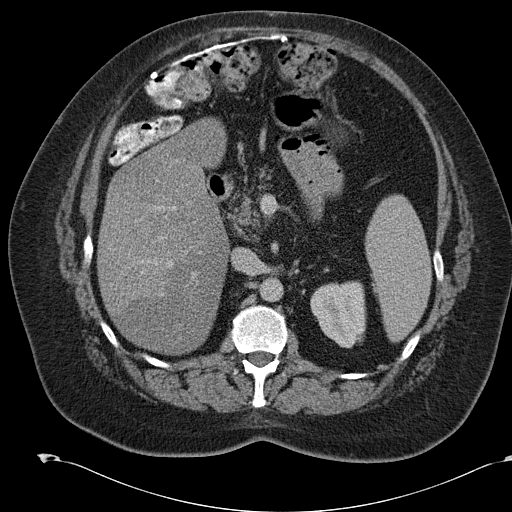
[im 67/103  bone]
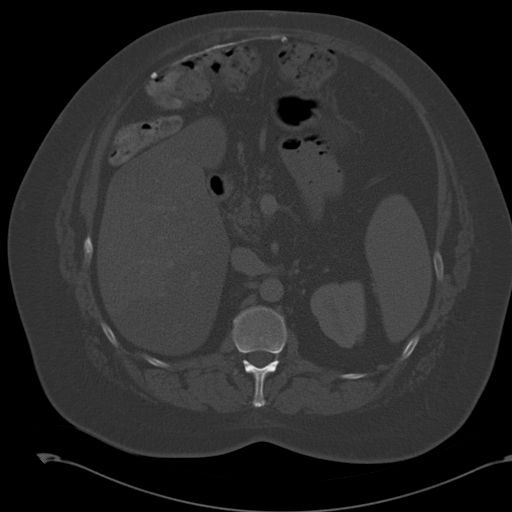
[im 76/103  soft-tissue]
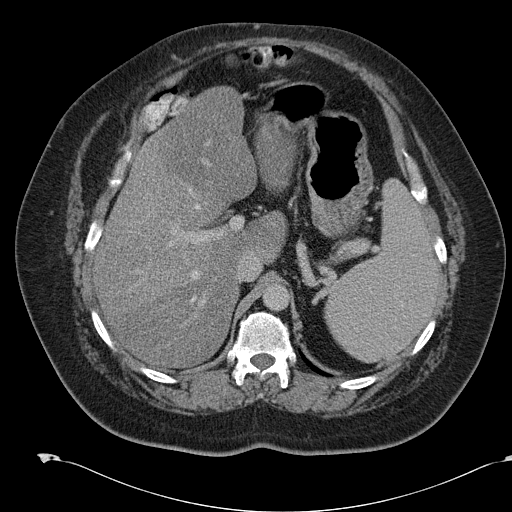
[im 80/103  soft-tissue]
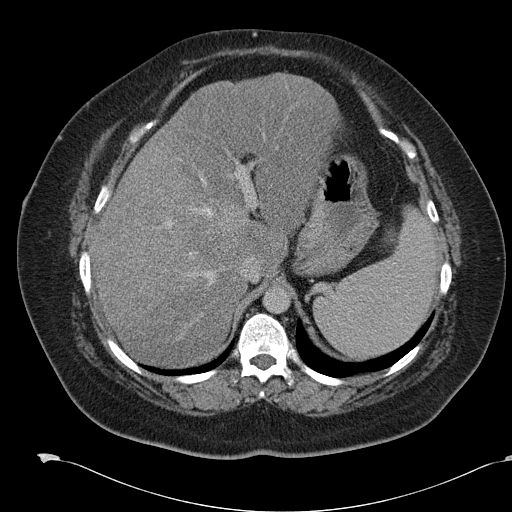
[im 85/103  lung]
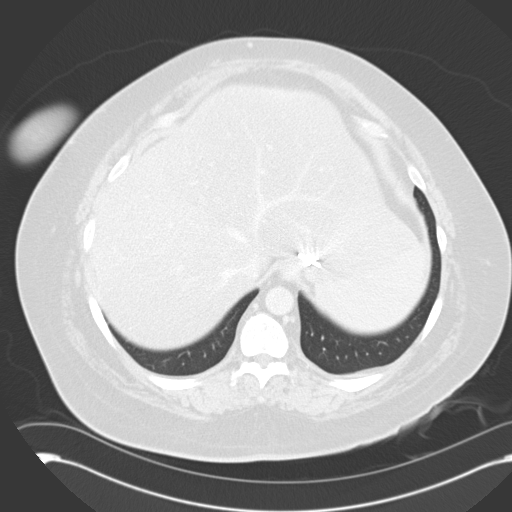
[im 89/103  soft-tissue]
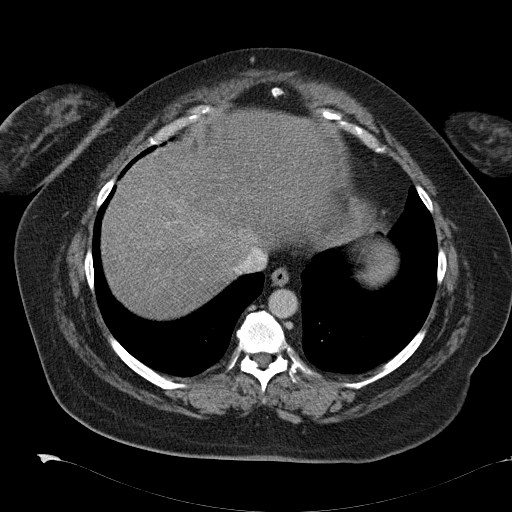
[im 89/103  lung]
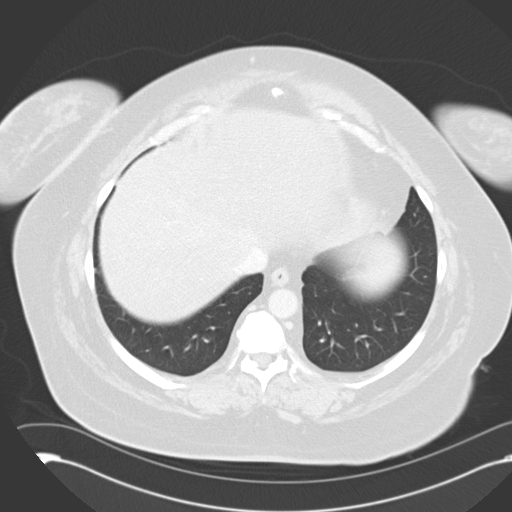
[im 94/103  lung]
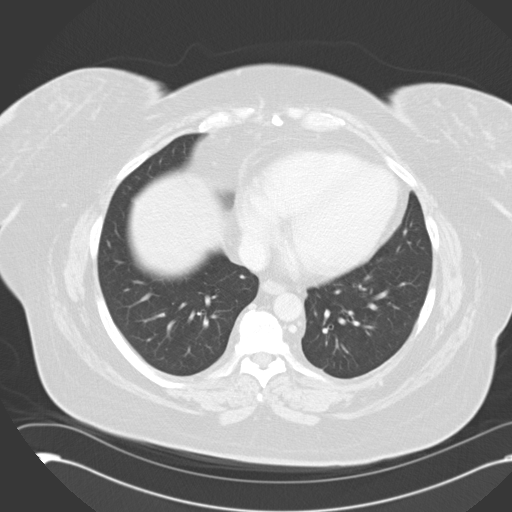
[im 98/103  soft-tissue]
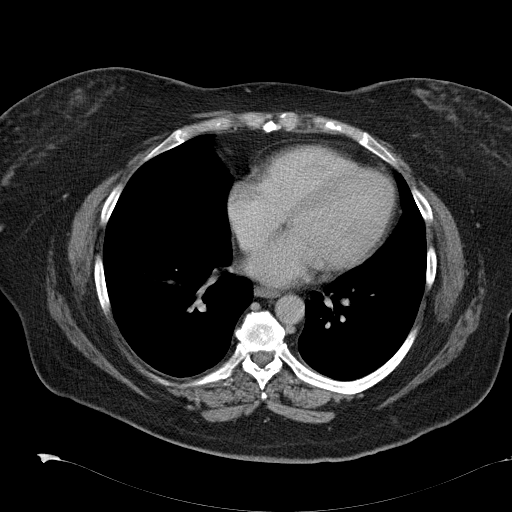
[im 98/103  lung]
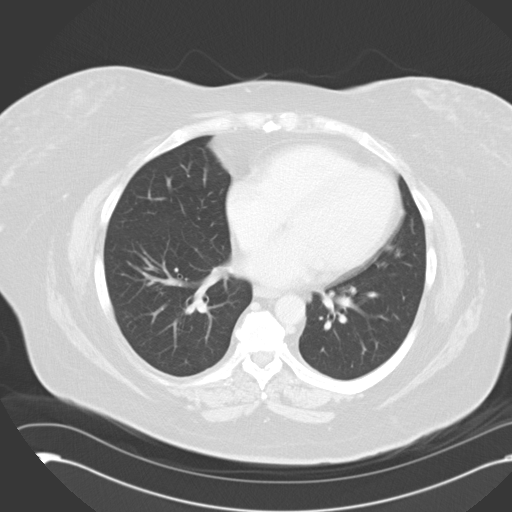

[15 of 32 positions shown; findings below may reference images not displayed]

FINDINGS: Lung bases clear.  Normal heart size.  No pericardial or
pleural effusion.

Abdomen:  Liver is enlarged measuring 22 cm in length.  Diffuse
geographic fatty infiltration throughout the liver.  Hepatic and
portal veins remain patent.  No biliary dilatation.  Prior
cholecystectomy evident.  Diffuse fatty replacement of the
pancreas.

The spleen, adrenal glands, and kidneys demonstrate no acute
finding and are within normal limits for age.  No hydronephrosis or
obstructing urinary tract calculus.

Postop staples at the GE junction.  No bowel obstruction,
dilatation, ileus, or free air. Anterior abdominal wall ventral
hernia repair.  No recurrent hernia evident.  Subcutaneous
injection site noted in the right abdomen, image 64.  Retained
stool throughout the colon.

Pelvis:  Unremarkable bladder.  Previous hysterectomy noted.  Small
left ovarian follicle noted, 12 mm, image 78.  No adnexal
abnormality.  The distal colon is relatively collapsed.  No pelvic
free fluid, fluid collection, hemorrhage, abnormal adenopathy, or
inguinal abnormality.  No distal ureteral calculus or UVJ
abnormality.  Mild degenerative changes of the L5 S1 level.
IMPRESSION: Hepatomegaly and diffuse geographic hepatic steatosis.

Postop changes from Nissen fundoplication, cholecystectomy, and
ventral hernia repair.  No recurrent abdominal wall hernia or bowel
obstruction.

No acute intra-abdominal or pelvic finding.

## 2011-12-30 ENCOUNTER — Inpatient Hospital Stay (HOSPITAL_COMMUNITY)
Admission: EM | Admit: 2011-12-30 | Discharge: 2012-01-03 | DRG: 092 | Disposition: A | Discharge status: Home / Self Care | Payer: BC Managed Care – PPO | Attending: Internal Medicine | Admitting: Internal Medicine

## 2011-12-30 ENCOUNTER — Emergency Department (HOSPITAL_COMMUNITY): Payer: BC Managed Care – PPO

## 2011-12-30 ENCOUNTER — Observation Stay (HOSPITAL_COMMUNITY): Payer: BC Managed Care – PPO

## 2011-12-30 ENCOUNTER — Encounter (HOSPITAL_COMMUNITY): Payer: Self-pay | Admitting: Emergency Medicine

## 2011-12-30 DIAGNOSIS — D860 Sarcoidosis of lung: Secondary | ICD-10-CM

## 2011-12-30 DIAGNOSIS — IMO0002 Reserved for concepts with insufficient information to code with codable children: Secondary | ICD-10-CM

## 2011-12-30 DIAGNOSIS — E119 Type 2 diabetes mellitus without complications: Secondary | ICD-10-CM

## 2011-12-30 DIAGNOSIS — J4489 Other specified chronic obstructive pulmonary disease: Secondary | ICD-10-CM | POA: Diagnosis present

## 2011-12-30 DIAGNOSIS — R05 Cough: Secondary | ICD-10-CM | POA: Insufficient documentation

## 2011-12-30 DIAGNOSIS — Z79899 Other long term (current) drug therapy: Secondary | ICD-10-CM

## 2011-12-30 DIAGNOSIS — K219 Gastro-esophageal reflux disease without esophagitis: Secondary | ICD-10-CM

## 2011-12-30 DIAGNOSIS — Z794 Long term (current) use of insulin: Secondary | ICD-10-CM

## 2011-12-30 DIAGNOSIS — E785 Hyperlipidemia, unspecified: Secondary | ICD-10-CM

## 2011-12-30 DIAGNOSIS — D869 Sarcoidosis, unspecified: Principal | ICD-10-CM | POA: Diagnosis present

## 2011-12-30 DIAGNOSIS — J449 Chronic obstructive pulmonary disease, unspecified: Secondary | ICD-10-CM | POA: Diagnosis present

## 2011-12-30 DIAGNOSIS — I10 Essential (primary) hypertension: Secondary | ICD-10-CM | POA: Diagnosis present

## 2011-12-30 DIAGNOSIS — D72829 Elevated white blood cell count, unspecified: Secondary | ICD-10-CM | POA: Diagnosis present

## 2011-12-30 DIAGNOSIS — R0602 Shortness of breath: Secondary | ICD-10-CM | POA: Diagnosis present

## 2011-12-30 DIAGNOSIS — J40 Bronchitis, not specified as acute or chronic: Secondary | ICD-10-CM

## 2011-12-30 DIAGNOSIS — E1129 Type 2 diabetes mellitus with other diabetic kidney complication: Secondary | ICD-10-CM | POA: Diagnosis present

## 2011-12-30 DIAGNOSIS — R053 Chronic cough: Secondary | ICD-10-CM | POA: Diagnosis present

## 2011-12-30 DIAGNOSIS — G4730 Sleep apnea, unspecified: Secondary | ICD-10-CM

## 2011-12-30 DIAGNOSIS — M109 Gout, unspecified: Secondary | ICD-10-CM | POA: Diagnosis present

## 2011-12-30 DIAGNOSIS — Z6841 Body Mass Index (BMI) 40.0 and over, adult: Secondary | ICD-10-CM

## 2011-12-30 DIAGNOSIS — E669 Obesity, unspecified: Secondary | ICD-10-CM | POA: Diagnosis present

## 2011-12-30 DIAGNOSIS — R059 Cough, unspecified: Secondary | ICD-10-CM | POA: Insufficient documentation

## 2011-12-30 DIAGNOSIS — Z7982 Long term (current) use of aspirin: Secondary | ICD-10-CM

## 2011-12-30 DIAGNOSIS — J99 Respiratory disorders in diseases classified elsewhere: Secondary | ICD-10-CM | POA: Diagnosis present

## 2011-12-30 LAB — POCT I-STAT, CHEM 8
BUN: 17 mg/dL (ref 6–23)
Calcium, Ion: 1.15 mmol/L (ref 1.12–1.32)
Chloride: 101 mEq/L (ref 96–112)
Creatinine, Ser: 0.7 mg/dL (ref 0.50–1.10)
Glucose, Bld: 330 mg/dL — ABNORMAL HIGH (ref 70–99)
HCT: 38 % (ref 36.0–46.0)
Hemoglobin: 12.9 g/dL (ref 12.0–15.0)
Potassium: 4 mEq/L (ref 3.5–5.1)
Sodium: 140 mEq/L (ref 135–145)
TCO2: 23 mmol/L (ref 0–100)

## 2011-12-30 LAB — DIFFERENTIAL
Basophils Absolute: 0 10*3/uL (ref 0.0–0.1)
Basophils Relative: 0 % (ref 0–1)
Eosinophils Absolute: 0 10*3/uL (ref 0.0–0.7)
Eosinophils Relative: 0 % (ref 0–5)
Lymphocytes Relative: 8 % — ABNORMAL LOW (ref 12–46)
Lymphs Abs: 1.3 10*3/uL (ref 0.7–4.0)
Monocytes Absolute: 0.9 10*3/uL (ref 0.1–1.0)
Monocytes Relative: 5 % (ref 3–12)
Neutro Abs: 14.9 10*3/uL — ABNORMAL HIGH (ref 1.7–7.7)
Neutrophils Relative %: 87 % — ABNORMAL HIGH (ref 43–77)

## 2011-12-30 LAB — CBC
HCT: 35.2 % — ABNORMAL LOW (ref 36.0–46.0)
Hemoglobin: 11.9 g/dL — ABNORMAL LOW (ref 12.0–15.0)
MCH: 30.8 pg (ref 26.0–34.0)
MCHC: 33.8 g/dL (ref 30.0–36.0)
MCV: 91.2 fL (ref 78.0–100.0)
Platelets: 373 10*3/uL (ref 150–400)
RBC: 3.86 MIL/uL — ABNORMAL LOW (ref 3.87–5.11)
RDW: 13 % (ref 11.5–15.5)
WBC: 17.2 10*3/uL — ABNORMAL HIGH (ref 4.0–10.5)

## 2011-12-30 LAB — POCT I-STAT 3, ART BLOOD GAS (G3+)
Acid-base deficit: 2 mmol/L (ref 0.0–2.0)
Bicarbonate: 23.5 mEq/L (ref 20.0–24.0)
Bicarbonate: 23.9 mEq/L (ref 20.0–24.0)
O2 Saturation: 79 %
O2 Saturation: 99 %
Patient temperature: 98.6
Patient temperature: 98.6
TCO2: 25 mmol/L (ref 0–100)
TCO2: 25 mmol/L (ref 0–100)
pCO2 arterial: 36.1 mmHg (ref 35.0–45.0)
pCO2 arterial: 39.9 mmHg (ref 35.0–45.0)
pH, Arterial: 7.379 (ref 7.350–7.400)
pH, Arterial: 7.43 — ABNORMAL HIGH (ref 7.350–7.400)
pO2, Arterial: 155 mmHg — ABNORMAL HIGH (ref 80.0–100.0)
pO2, Arterial: 44 mmHg — ABNORMAL LOW (ref 80.0–100.0)

## 2011-12-30 IMAGING — CR DG CHEST 2V
2 series · 2 of 2 positions shown · non-contrast
Comparison: 02/07/2011

CLINICAL DATA: Chest pain.

CHEST - 2 VIEW

[w chest pa]
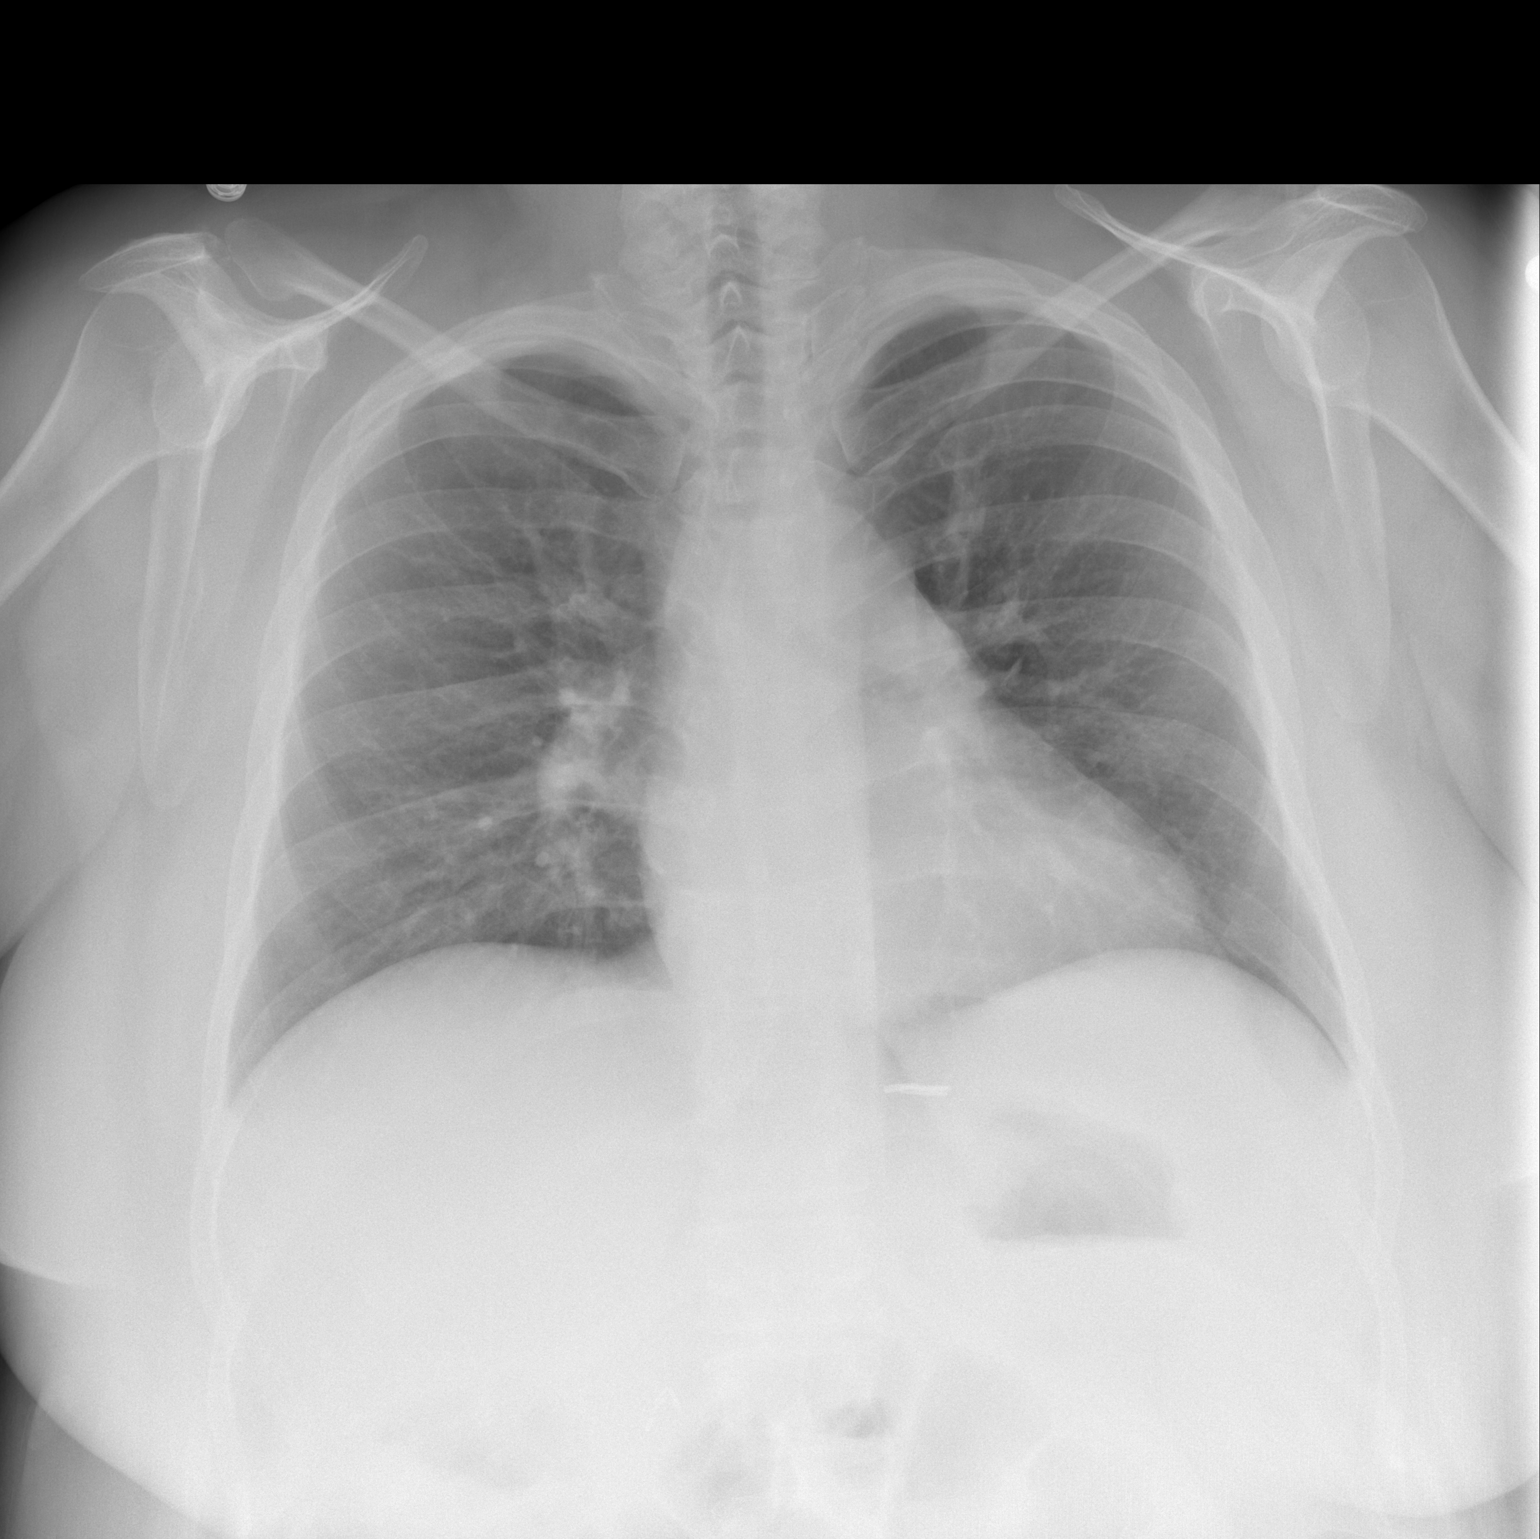

[w chest lat]
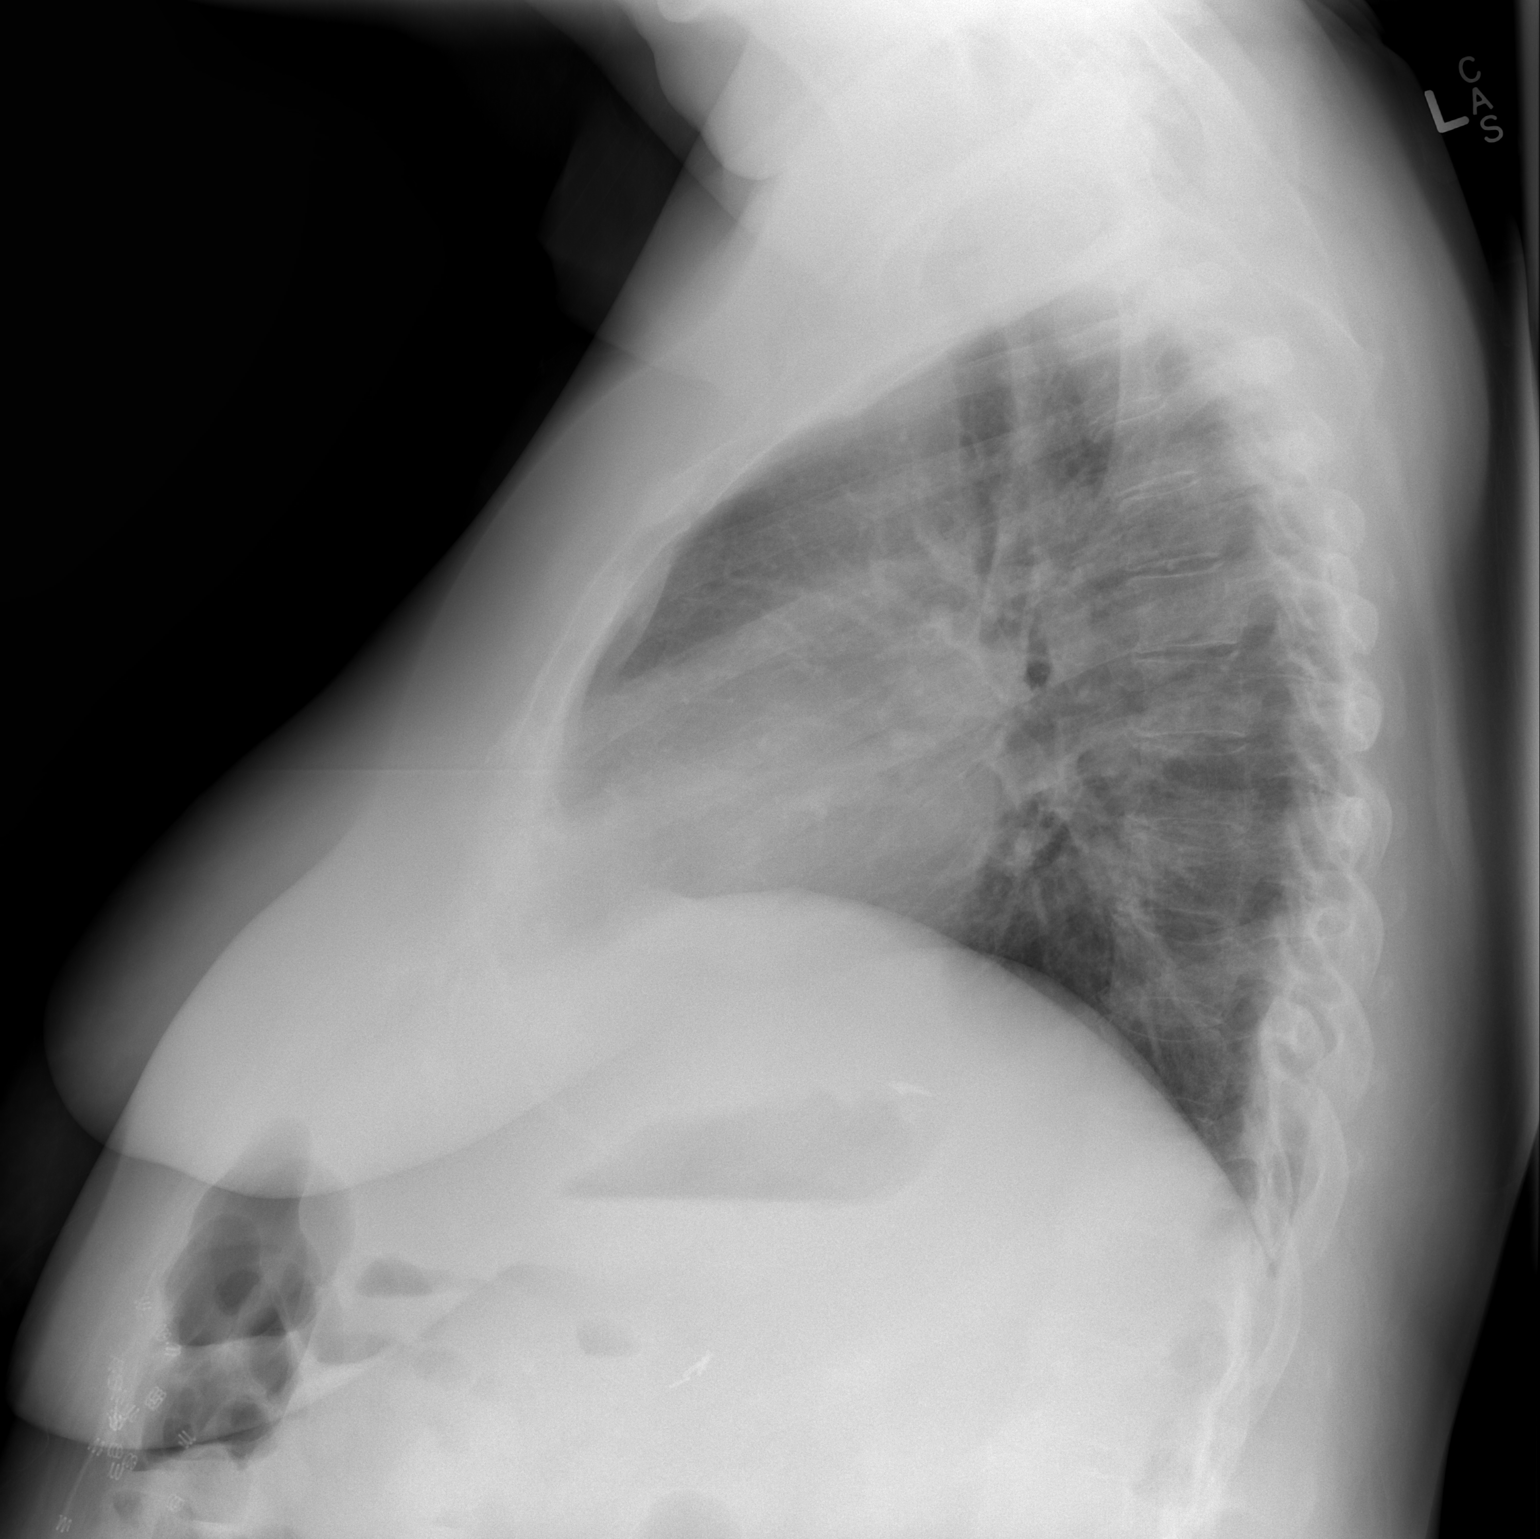

[2 of 2 positions shown; findings below may reference images not displayed]

FINDINGS: Slightly hypoaerated results in interstitial crowding.
Otherwise, clear lungs.  No focal consolidation, pleural effusion,
or pneumothorax.  Stable cardiomediastinal contours, within normal
limits.  Surgical clip projects over the GE junction.  Otherwise,
soft tissues and osseous structures are within normal limits.
IMPRESSION: No acute cardiopulmonary process.

## 2011-12-30 MED ORDER — ALBUTEROL (5 MG/ML) CONTINUOUS INHALATION SOLN
10.0000 mg/h | INHALATION_SOLUTION | Freq: Once | RESPIRATORY_TRACT | Status: AC
  Administered 2011-12-30: 10 mg/h via RESPIRATORY_TRACT
  Filled 2011-12-30: qty 20

## 2011-12-30 MED ORDER — ACETAMINOPHEN 325 MG PO TABS
650.0000 mg | ORAL_TABLET | Freq: Once | ORAL | Status: AC
  Administered 2011-12-30: 650 mg via ORAL

## 2011-12-30 MED ORDER — ACETAMINOPHEN 325 MG PO TABS
ORAL_TABLET | ORAL | Status: AC
  Filled 2011-12-30: qty 2

## 2011-12-30 MED ORDER — ALBUTEROL (5 MG/ML) CONTINUOUS INHALATION SOLN
10.0000 mg/h | INHALATION_SOLUTION | Freq: Once | RESPIRATORY_TRACT | Status: AC
  Administered 2011-12-30: 10 mg/h via RESPIRATORY_TRACT

## 2011-12-30 MED ORDER — IPRATROPIUM BROMIDE 0.02 % IN SOLN
0.5000 mg | Freq: Four times a day (QID) | RESPIRATORY_TRACT | Status: DC | PRN
  Administered 2011-12-30: 0.5 mg via RESPIRATORY_TRACT
  Filled 2011-12-30 (×4): qty 2.5

## 2011-12-30 MED ORDER — ALBUTEROL SULFATE (5 MG/ML) 0.5% IN NEBU
INHALATION_SOLUTION | RESPIRATORY_TRACT | Status: AC
  Filled 2011-12-30: qty 2

## 2011-12-30 MED ORDER — LORAZEPAM 2 MG/ML IJ SOLN
0.5000 mg | Freq: Once | INTRAMUSCULAR | Status: AC
  Administered 2011-12-30: 0.5 mg via INTRAVENOUS
  Filled 2011-12-30: qty 1

## 2011-12-30 MED ORDER — ALBUTEROL SULFATE (5 MG/ML) 0.5% IN NEBU: 2.5000 mg | INHALATION_SOLUTION | Freq: Once | RESPIRATORY_TRACT | Status: DC

## 2011-12-30 MED ORDER — LORAZEPAM 1 MG PO TABS
1.0000 mg | ORAL_TABLET | Freq: Once | ORAL | Status: AC
  Administered 2011-12-30: 1 mg via ORAL
  Filled 2011-12-30: qty 1

## 2011-12-30 MED ORDER — MAGNESIUM SULFATE 40 MG/ML IJ SOLN
2.0000 g | Freq: Once | INTRAMUSCULAR | Status: AC
  Administered 2011-12-30: 2 g via INTRAVENOUS
  Filled 2011-12-30: qty 50

## 2011-12-30 MED ORDER — PREDNISONE 20 MG PO TABS
60.0000 mg | ORAL_TABLET | Freq: Once | ORAL | Status: AC
  Administered 2011-12-30: 60 mg via ORAL
  Filled 2011-12-30: qty 3

## 2011-12-30 MED ORDER — ALBUTEROL SULFATE (5 MG/ML) 0.5% IN NEBU
5.0000 mg | INHALATION_SOLUTION | RESPIRATORY_TRACT | Status: DC | PRN
  Administered 2011-12-30: 5 mg via RESPIRATORY_TRACT
  Filled 2011-12-30: qty 1
  Filled 2011-12-30: qty 0.5

## 2011-12-30 MED ORDER — IPRATROPIUM BROMIDE 0.02 % IN SOLN: 0.5000 mg | Freq: Once | RESPIRATORY_TRACT | Status: DC

## 2011-12-30 MED ORDER — ALBUTEROL SULFATE (5 MG/ML) 0.5% IN NEBU
2.5000 mg | INHALATION_SOLUTION | Freq: Once | RESPIRATORY_TRACT | Status: AC
  Administered 2011-12-30: 2.5 mg via RESPIRATORY_TRACT

## 2011-12-30 MED ORDER — ALBUTEROL SULFATE (5 MG/ML) 0.5% IN NEBU
2.5000 mg | INHALATION_SOLUTION | Freq: Once | RESPIRATORY_TRACT | Status: AC
  Administered 2011-12-30: 5 mg via RESPIRATORY_TRACT

## 2011-12-30 MED ORDER — IOHEXOL 350 MG/ML SOLN
100.0000 mL | Freq: Once | INTRAVENOUS | Status: AC | PRN
  Administered 2011-12-30: 100 mL via INTRAVENOUS

## 2011-12-30 MED ORDER — INSULIN GLARGINE 100 UNIT/ML ~~LOC~~ SOLN
5.0000 [IU] | Freq: Every day | SUBCUTANEOUS | Status: DC
  Administered 2011-12-30: 5 [IU] via SUBCUTANEOUS
  Filled 2011-12-30 (×2): qty 1

## 2011-12-30 MED ORDER — IPRATROPIUM BROMIDE 0.02 % IN SOLN
0.5000 mg | Freq: Once | RESPIRATORY_TRACT | Status: AC
  Administered 2011-12-30: 0.5 mg via RESPIRATORY_TRACT
  Filled 2011-12-30: qty 2.5

## 2011-12-30 MED ORDER — ALBUTEROL SULFATE (5 MG/ML) 0.5% IN NEBU
INHALATION_SOLUTION | RESPIRATORY_TRACT | Status: AC
  Filled 2011-12-30: qty 1

## 2011-12-30 MED ORDER — ONDANSETRON 4 MG PO TBDP
8.0000 mg | ORAL_TABLET | Freq: Once | ORAL | Status: AC
  Administered 2011-12-30: 8 mg via ORAL
  Filled 2011-12-30: qty 2

## 2011-12-30 MED ORDER — MORPHINE SULFATE 4 MG/ML IJ SOLN
4.0000 mg | Freq: Once | INTRAMUSCULAR | Status: AC
  Administered 2011-12-30: 4 mg via INTRAVENOUS
  Filled 2011-12-30: qty 1

## 2011-12-30 MED ORDER — ALBUTEROL SULFATE (5 MG/ML) 0.5% IN NEBU
2.5000 mg | INHALATION_SOLUTION | Freq: Once | RESPIRATORY_TRACT | Status: DC
  Filled 2011-12-30: qty 0.5

## 2011-12-30 MED ORDER — ALBUTEROL SULFATE (5 MG/ML) 0.5% IN NEBU
5.0000 mg | INHALATION_SOLUTION | Freq: Once | RESPIRATORY_TRACT | Status: AC
  Administered 2011-12-30: 5 mg via RESPIRATORY_TRACT
  Filled 2011-12-30: qty 1

## 2011-12-30 NOTE — H&P (Addendum)
Patient's PCP: [name], MD, MD  Chief Complaint: SOB  History of Present Illness: [name] is a 51 y.o. white  female with a history of sarcoidosis followed by Dr. Delford Field.  Presents with cough, wheezing and SOB.  The current episode started 5 days ago. The problem has been gradually worsening. The cough is non-productive. There has been no fever. Associated symptoms include shortness of breath and wheezing. Pertinent negatives include no chest pain, no chills, no ear congestion, no headaches, no rhinorrhea, no sore throat, no myalgias and no eye redness. Patient called her PCP and got prescriptions for prednisone and amoxicillin. The treatment provided no relief.  Patient was placed in CDU in ER but has not improved and hospitalist were called to admit.  CTA negative for PE    Meds: Scheduled Meds:    . albuterol  2.5 mg Nebulization Once  . albuterol  2.5 mg Nebulization Once  . albuterol  5 mg Nebulization Once  . albuterol      . albuterol  10 mg/hr Nebulization Once  . albuterol  10 mg/hr Nebulization Once  . ipratropium  0.5 mg Nebulization Once  . LORazepam  0.5 mg Intravenous Once  . LORazepam  1 mg Oral Once  . magnesium sulfate  2 g Intravenous Once  .  morphine injection  4 mg Intravenous Once  . ondansetron  8 mg Oral Once  . predniSONE  60 mg Oral Once  . DISCONTD: albuterol  2.5 mg Nebulization Once  . DISCONTD: albuterol  2.5 mg Nebulization Once  . DISCONTD: albuterol  2.5 mg Nebulization Once  . DISCONTD: ipratropium  0.5 mg Nebulization Once   Continuous Infusions:  PRN Meds:.albuterol, iohexol, ipratropium Allergies: Lisinopril and Methotrexate Past Medical History  Diagnosis Date  . Sarcoidosis 04/10/2007  . DIABETES MELLITUS, TYPE II 08/21/2006  . HYPERLIPIDEMIA 08/21/2006  . GOUT 08/20/2010  . OBESITY 06/04/2009  . ANEMIA-UNSPECIFIED 09/18/2009  . HYPERTENSION 08/21/2006  . GERD 08/21/2006  . Shortness of breath   . SLEEP APNEA  04/21/2009    last testing was negative   Past Surgical History  Procedure Date  . Ventral hernia repair   . Nissen fundoplication   . Cholecystectomy   . Abdominal hysterectomy   . Knee arthroscopy     right  . Tubal ligation   . Bladder suspension 11/11/2011    Procedure: TRANSVAGINAL TAPE (TVT) PROCEDURE;  Surgeon: Levi Aland, MD;  Location: [facility];  Service: Gynecology;  Laterality: N/A;  . Cystoscopy 11/11/2011    Procedure: CYSTOSCOPY;  Surgeon: Levi Aland, MD;  Location: [facility];  Service: Gynecology;  Laterality: N/A;   Family History  Problem Relation Age of Onset  . Diabetes Father   . Heart attack Father   . Coronary artery disease Father   . Heart failure Father   . COPD Mother   . Emphysema Mother   . Asthma Mother   . Heart failure Mother   . Sarcoidosis Maternal Uncle   . Colon cancer Neg Hx   . Lung cancer Brother   . Cancer Brother    History   Social History  . Marital Status: Married    Spouse Name: N/A    Number of Children: N/A  . Years of Education: N/A   Occupational History  . DISABLED    Social History Main Topics  . Smoking status: Never Smoker   . Smokeless tobacco: Never Used  . Alcohol Use: No  . Drug Use:  No  . Sexually Active: Yes   Other Topics Concern  . Not on file   Social History Narrative  . No narrative on file   Review of Systems: All systems reviewed with the patient and positive as per history of present illness, otherwise all other systems are negative.   Physical Exam: Blood pressure 148/71, pulse 104, temperature 97.8 F (36.6 C), temperature source Oral, resp. rate 25, SpO2 98.00%. General: Awake, Oriented x3, No acute distress.- sitting on side of bed eating dinner HEENT: EOMI, Moist mucous membranes Neck: Supple CV: S1 and S2, tachy Lungs: expiratory wheezing B/L- able to speak in sentences with minimal SOB Abdomen: Soft, Nontender, Nondistended, +bowel sounds, obese Ext: Good pulses. Trace  edema. No clubbing or cyanosis noted. Neuro: Cranial Nerves II-XII grossly intact. Has 5/5 motor strength in upper and lower extremities.    Lab results:  [facility] 12/30/11 1544  NA 140  K 4.0  CL 101  CO2 --  GLUCOSE 330*  BUN 17  CREATININE 0.70  CALCIUM --  MG --  PHOS --   No results found for this basename: AST:2,ALT:2,ALKPHOS:2,BILITOT:2,PROT:2,ALBUMIN:2 in the last 72 hours No results found for this basename: LIPASE:2,AMYLASE:2 in the last 72 hours  Basename 12/30/11 1544 12/30/11 1348  WBC -- 17.2*  NEUTROABS -- 14.9*  HGB 12.9 11.9*  HCT 38.0 35.2*  MCV -- 91.2  PLT -- 373   No results found for this basename: CKTOTAL:3,CKMB:3,CKMBINDEX:3,TROPONINI:3 in the last 72 hours No components found with this basename: POCBNP:3 No results found for this basename: DDIMER in the last 72 hours No results found for this basename: HGBA1C:2 in the last 72 hours No results found for this basename: CHOL:2,HDL:2,LDLCALC:2,TRIG:2,CHOLHDL:2,LDLDIRECT:2 in the last 72 hours No results found for this basename: TSH,T4TOTAL,FREET3,T3FREE,THYROIDAB in the last 72 hours No results found for this basename: VITAMINB12:2,FOLATE:2,FERRITIN:2,TIBC:2,IRON:2,RETICCTPCT:2 in the last 72 hours Imaging results:  Dg Chest 2 View  12/30/2011  *RADIOLOGY REPORT*  Clinical Data: Fall.  SOB  CHEST - 2 VIEW  Comparison: 07/23/2012  Findings: Heart size is mildly enlarged.  No pleural effusion or edema.  No airspace consolidation identified.  Review of the visualized osseous structures is unremarkable.  IMPRESSION:  1.  No acute cardiopulmonary abnormalities.  Original Report Authenticated By: Rosealee Albee, M.D.   Ct Angio Chest W/cm &/or Wo Cm  12/30/2011  *RADIOLOGY REPORT*  Clinical Data: Chest pain and shortness of breath.  CT ANGIOGRAPHY CHEST  Technique:  Multidetector CT imaging of the chest using the standard protocol during bolus administration of intravenous contrast. Multiplanar reconstructed  images including MIPs were obtained and reviewed to evaluate the vascular anatomy.  Contrast: OMNIPAQUE IOHEXOL 350 MG/ML IV SOLN  Comparison: 09/09/2011.  Findings: No pulmonary embolus.  No pathologically enlarged mediastinal, hilar or axillary lymph nodes.  Heart is enlarged.  No pericardial effusion. There is focal left coronary artery calcification.  Added density in the lungs bilaterally is likely due to expiratory phase imaging.  Minimal subsegmental atelectasis in the lower lobes.  There may be clustered peribronchovascular nodularity in the left lower lobe (images 34-36), new from the prior study and likely infectious or post infectious in etiology.  No pleural fluid.  Airway is unremarkable.  Incidental imaging of the upper abdomen shows no acute findings. No worrisome lytic or sclerotic lesions.  IMPRESSION:  1.  No pulmonary embolus. 2.  No acute findings to explain the patient's given symptoms. 3.  Focal left coronary artery calcification.  Original Report Authenticated By:  Reyes Ivan, M.D.   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  Peightyn Roberson, DO 12/30/2011, 6:45 PM

## 2011-12-30 NOTE — ED Provider Notes (Signed)
Medical screening examination/treatment/procedure(s) were performed by non-physician practitioner and as supervising physician I was immediately available for consultation/collaboration.   Shantay Sonn A. Patrica Duel, MD 12/30/11 1610

## 2011-12-30 NOTE — ED Notes (Signed)
The pt has difficulty breathing tonight.  She appears very anxious jittery.   The breathing noise sounds like it is in her throat.

## 2011-12-30 NOTE — ED Notes (Signed)
Chad, PA-C informed patient's respiratory status deteriorating, PA in to see patient at this time

## 2011-12-30 NOTE — ED Provider Notes (Signed)
History     CSN: [id number]  Arrival date & time 12/30/11  0059   First MD Initiated Contact with Patient 12/30/11 [phone]      Chief Complaint  Patient presents with  . Cough    (Consider location/radiation/quality/duration/timing/severity/associated sxs/prior treatment) Patient is a 51 y.o. female presenting with cough. The history is provided by the patient. No language interpreter was used.  Cough This is a new problem. The current episode started more than 2 days ago (5 days ago). The problem has been gradually worsening. The cough is non-productive. There has been no fever. Associated symptoms include shortness of breath and wheezing. Pertinent negatives include no chest pain, no chills, no ear congestion, no headaches, no rhinorrhea, no sore throat, no myalgias and no eye redness. Treatments tried: prednisone and amoxicillin. The treatment provided no relief. Past medical history comments: sarcoid.    Past Medical History  Diagnosis Date  . Sarcoidosis 04/10/2007  . DIABETES MELLITUS, TYPE II 08/21/2006  . HYPERLIPIDEMIA 08/21/2006  . GOUT 08/20/2010  . OBESITY 06/04/2009  . ANEMIA-UNSPECIFIED 09/18/2009  . HYPERTENSION 08/21/2006  . GERD 08/21/2006  . Shortness of breath   . SLEEP APNEA 04/21/2009    last testing was negative    Past Surgical History  Procedure Date  . Ventral hernia repair   . Nissen fundoplication   . Cholecystectomy   . Abdominal hysterectomy   . Knee arthroscopy     right  . Tubal ligation   . Bladder suspension 11/11/2011    Procedure: TRANSVAGINAL TAPE (TVT) PROCEDURE;  Surgeon: Levi Aland, MD;  Location: [facility];  Service: Gynecology;  Laterality: N/A;  . Cystoscopy 11/11/2011    Procedure: CYSTOSCOPY;  Surgeon: Levi Aland, MD;  Location: [facility];  Service: Gynecology;  Laterality: N/A;    Family History  Problem Relation Age of Onset  . Diabetes Father   . Heart attack Father   . Coronary artery disease Father   . Heart failure  Father   . COPD Mother   . Emphysema Mother   . Asthma Mother   . Heart failure Mother   . Sarcoidosis Maternal Uncle   . Colon cancer Neg Hx   . Lung cancer Brother   . Cancer Brother     History  Substance Use Topics  . Smoking status: Never Smoker   . Smokeless tobacco: Never Used  . Alcohol Use: No    OB History    Grav Para Term Preterm Abortions TAB SAB Ect Mult Living                  Review of Systems  Constitutional: Negative for fever, chills, activity change, appetite change and fatigue.  HENT: Positive for congestion. Negative for sore throat, rhinorrhea, neck pain and neck stiffness.   Eyes: Negative for redness.  Respiratory: Positive for cough, shortness of breath and wheezing.   Cardiovascular: Negative for chest pain and palpitations.  Gastrointestinal: Negative for nausea, vomiting and abdominal pain.  Genitourinary: Negative for dysuria, urgency, frequency and flank pain.  Musculoskeletal: Negative for myalgias.  Neurological: Negative for dizziness, weakness, light-headedness, numbness and headaches.  All other systems reviewed and are negative.    Allergies  Lisinopril and Methotrexate  Home Medications   Current Outpatient Rx  Name Route Sig Dispense Refill  . HUMIRA PEN [place] Subcutaneous Inject 40 mg into the skin every 14 (fourteen) days. Injection every 2 weeks.    . ALBUTEROL SULFATE (2.5 MG/3ML) 0.083% IN NEBU Nebulization  Take 3 mLs (2.5 mg total) by nebulization every 6 (six) hours as needed for wheezing. 150 mL 1  . AMOXICILLIN 500 MG PO TABS Oral Take 2 tablets (1,000 mg total) by mouth 2 (two) times daily. 40 tablet 0  . ASPIRIN 81 MG PO TABS Oral Take 81 mg by mouth daily.      . ATENOLOL 50 MG PO TABS Oral Take 50 mg by mouth daily.     . BUPROPION HCL ER (XL) 300 MG PO TB24 Oral Take 1 tablet (300 mg total) by mouth daily. 30 tablet 5  . HYDROCOD POLST-CPM POLST ER 10-8 MG/5ML PO LQCR Oral Take 5 mLs by mouth every 12 (twelve) hours  as needed. For cough & pain    . CHOLECALCIFEROL 1000 UNITS PO TABS Oral Take 2,000 Units by mouth daily.     [name] CICLESONIDE 50 MCG/ACT NA SUSP Each Nare Place 1 spray into both nostrils daily.     [name] CLONIDINE HCL 0.1 MG PO TABS Oral Take 0.1 mg by mouth daily.     [name] DEXILANT 60 MG PO CPDR  TAKE ONE CAPSULE BY MOUTH ONE TIME DAILY 30 capsule 11  . ESTRADIOL 1 MG PO TABS Oral Take 2 mg by mouth daily.     . FENOFIBRATE 145 MG PO TABS Oral Take 145 mg by mouth daily.      . FUROSEMIDE 20 MG PO TABS Oral Take 1 tablet (20 mg total) by mouth daily. 30 tablet 5  . LOSARTAN POTASSIUM 50 MG PO TABS Oral Take 50 mg by mouth daily.      [name] METFORMIN HCL 500 MG PO TABS Oral Take 1 tablet (500 mg total) by mouth 2 (two) times daily with a meal. 60 tablet 5  . POTASSIUM CHLORIDE CRYS ER 20 MEQ PO TBCR Oral Take 1 tablet (20 mEq total) by mouth 3 (three) times daily. 90 tablet 5  . PREDNISONE 10 MG PO TABS Oral Take 10 mg by mouth daily.    [name] PREDNISONE 20 MG PO TABS Oral Take 1 tablet (20 mg total) by mouth daily. 10 tablet 0  . PREDNISONE 20 MG PO TABS Oral Take 40 mg by mouth daily. For 5 days, started 12/27/2011    . VENLAFAXINE HCL ER 75 MG PO CP24 Oral Take 150 mg by mouth 2 (two) times daily.     [name] GLUCOSE BLOOD VI STRP  Use as instructed 100 each 12  . NITROGLYCERIN 0.4 MG SL SUBL Sublingual Place 0.4 mg under the tongue every 5 (five) minutes as needed. For chest pain      BP 150/77  Pulse 74  Temp(Src) 98.3 F (36.8 C) (Oral)  Resp 26  SpO2 97%  Physical Exam  Nursing note and vitals reviewed. Constitutional: She is oriented to person, place, and time. She appears well-developed and well-nourished. No distress.  HENT:  Head: Normocephalic and atraumatic.  Mouth/Throat: Oropharynx is clear and moist. No oropharyngeal exudate.  Eyes: Conjunctivae and EOM are normal. Pupils are equal, round, and reactive to light.  Neck: Normal range of motion. Neck supple.  Cardiovascular: Normal rate,  regular rhythm, normal heart sounds and intact distal pulses.  Exam reveals no gallop and no friction rub.   No murmur heard. Pulmonary/Chest: Effort normal. No respiratory distress. She has wheezes.       Patient also with upper airway sounds likely from vocal cord dysfunction  Abdominal: Soft. Bowel sounds are normal. There is no tenderness.  Musculoskeletal: Normal  range of motion. She exhibits no tenderness.  Neurological: She is alert and oriented to person, place, and time. No cranial nerve deficit.  Skin: Skin is warm and dry. No rash noted.    ED Course  Procedures (including critical care time)  Labs Reviewed - No data to display Dg Chest 2 View  12/30/2011  *RADIOLOGY REPORT*  Clinical Data: Fall.  SOB  CHEST - 2 VIEW  Comparison: 07/23/2012  Findings: Heart size is mildly enlarged.  No pleural effusion or edema.  No airspace consolidation identified.  Review of the visualized osseous structures is unremarkable.  IMPRESSION:  1.  No acute cardiopulmonary abnormalities.  Original Report Authenticated By: Rosealee Albee, M.D.     1. Bronchitis       MDM  Bronchitis. She received 2 breathing treatments prior to my assessment. She was given prednisone he was an hour-long albuterol treatment. Chest x-ray unremarkable. She's also given a dose of Ativan for possible vocal cord dysfunction. She'll be placed in the CDU on the asthma protocol. With improvement she'll be discharged home.  Discussed with the cdu PA emily west.  Dr Radford Pax also informed of the plan of care        Dayton Bailiff, MD 12/30/11 [phone]

## 2011-12-30 NOTE — ED Notes (Signed)
PT. REPORTS PROGRESSING SOB WITH PRODUCTIVE COUGH / WHEEZING AND SOB ONSET LAST WEEK SEEN BY PCP PRESCRIBED WITH PREDNISONE AND AMOXICILLIN WITH NO RELIEF.

## 2011-12-30 NOTE — ED Provider Notes (Signed)
Medical screening examination/treatment/procedure(s) were performed by non-physician practitioner and as supervising physician I was immediately available for consultation/collaboration.   Florentina Marquart A. Katelyne Galster, MD 12/30/11 1918 

## 2011-12-30 NOTE — ED Provider Notes (Signed)
Patient in CDU awaiting completion of diagnostic testing in the evaluation of wheezing and dyspnea.  Patient with 5 day history of mild dyspnea and wheezing.  Patient seen by her PCP and started on amoxicillin and prednisone.  Patient continued to have wheezing and exertional dyspnea and presented to the ED today.  Patient initially started on asthma exacerbation protocol, received numerous neb treatments, as well as steroids, magnesium, but continues to wheeze, has increased work of breathing and tachycardia with exertion.   Patient has returned from CT--results reviewed, no indication of pulmonary embolism or pneumonia.  Spoke with hospitalist, will admit.  Jimmye Norman, NP 12/30/11 [phone]

## 2011-12-30 NOTE — ED Notes (Signed)
Meal tray ordered for pt  

## 2011-12-30 NOTE — ED Provider Notes (Signed)
10:45 AM Patient is pending placement in CDU for asthma protocol.  This is a shared visit with Dr Brooke Dare. Pt w/ bronchitis and cough, with wheezing and SOB.  Pt with hx sarcoidosis. Patient's last treatment was at 7:30am.  States that she gets relief from the treatments, but the improvement "wears off" quickly.  Reports continued SOB.  On exam, pt is A&Ox4, NAD, tachycardic 110s, tachypneic with diffuse expiratory wheezes.  Have asked Alycia Rossetti to bring patient to CDU for protocol, will continue to follow.    12:53 PM Patient with continued wheezing and SOB.  Nurse Lorin Picket) ambulated patient and found that her oxygen saturation drops to 88% on room air.  Pt states she has been sick for approximately 10 days, that this has been gradual in onset.  She has been treated with prednisone and amoxicillin without relief.  Will continue wheeze protocol in CDU.  If patient continues to desaturate after multiple treatments, she may require admission.  Discussed this with patient who agrees with plan.    2:32 PM Patient's breathing is getting worse.  Patient with tachypnea, working hard to breath, decreased air movement throughout with end expiratory wheezes.  Patient does note that she had arthroscopic surgery on left knee Friday and breathing got increasingly worse a few days later.  Dr Patrica Duel present in CDU and went in to see the patient given concern about worsening SOB.  Per Dr Patrica Duel, patient to be given 0.5 ativan and 2mg  magnesium, ABG.  I have confirmed that patient is to be given ativan, given increased work of breathing- Dr Patrica Duel does recommend this.  Given recent orthopedic surgery, Dr Patrica Duel agrees with CT angio chest.  I have ordered blood work, anticipating admission for worsening bronchospasm, (hx asthma, sarcoidosis), hypoxia.  (Pt O2 saturation dropping despite nebulizer treatments, desats to 88% with ambulation).    3:57 PM Discussed patient with Felicie Morn, NP, who assumes care of patient at change of shift.     Rise Patience, PA 12/30/11 1558  Dillard Cannon [place], [place] 12/30/11 1559

## 2011-12-30 NOTE — ED Notes (Signed)
Patient placed on oxygen 2 liters via nasal cannula

## 2011-12-31 ENCOUNTER — Telehealth: Payer: Self-pay | Admitting: Internal Medicine

## 2011-12-31 ENCOUNTER — Encounter (HOSPITAL_COMMUNITY): Payer: Self-pay

## 2011-12-31 LAB — CBC
HCT: 32.2 % — ABNORMAL LOW (ref 36.0–46.0)
Hemoglobin: 10.7 g/dL — ABNORMAL LOW (ref 12.0–15.0)
MCH: 30.9 pg (ref 26.0–34.0)
MCHC: 33.2 g/dL (ref 30.0–36.0)
MCV: 93.1 fL (ref 78.0–100.0)
Platelets: 295 10*3/uL (ref 150–400)
RBC: 3.46 MIL/uL — ABNORMAL LOW (ref 3.87–5.11)
RDW: 13.2 % (ref 11.5–15.5)
WBC: 12.7 10*3/uL — ABNORMAL HIGH (ref 4.0–10.5)

## 2011-12-31 LAB — BASIC METABOLIC PANEL
BUN: 16 mg/dL (ref 6–23)
CO2: 29 mEq/L (ref 19–32)
Calcium: 9.2 mg/dL (ref 8.4–10.5)
Chloride: 99 mEq/L (ref 96–112)
Creatinine, Ser: 0.6 mg/dL (ref 0.50–1.10)
GFR calc Af Amer: >90 mL/min (ref >90)
GFR calc non Af Amer: >90 mL/min (ref >90)
Glucose, Bld: 169 mg/dL — ABNORMAL HIGH (ref 70–99)
Potassium: 3.8 mEq/L (ref 3.5–5.1)
Sodium: 139 mEq/L (ref 135–145)

## 2011-12-31 LAB — GLUCOSE, CAPILLARY
Glucose-Capillary: 180 mg/dL — ABNORMAL HIGH (ref 70–99)
Glucose-Capillary: 189 mg/dL — ABNORMAL HIGH (ref 70–99)
Glucose-Capillary: 267 mg/dL — ABNORMAL HIGH (ref 70–99)
Glucose-Capillary: 228 mg/dL — ABNORMAL HIGH (ref 70–99)
Glucose-Capillary: 224 mg/dL — ABNORMAL HIGH (ref 70–99)

## 2011-12-31 MED ORDER — FENOFIBRATE 54 MG PO TABS
54.0000 mg | ORAL_TABLET | Freq: Every day | ORAL | Status: DC
  Administered 2011-12-31 – 2012-01-03 (×4): 54 mg via ORAL
  Filled 2011-12-31 (×4): qty 1

## 2011-12-31 MED ORDER — VITAMIN D3 25 MCG (1000 UNIT) PO TABS
2000.0000 [IU] | ORAL_TABLET | Freq: Every day | ORAL | Status: DC
  Administered 2011-12-31 – 2012-01-03 (×4): 2000 [IU] via ORAL
  Filled 2011-12-31 (×4): qty 2

## 2011-12-31 MED ORDER — BUPROPION HCL ER (XL) 300 MG PO TB24
300.0000 mg | ORAL_TABLET | Freq: Every day | ORAL | Status: DC
  Administered 2011-12-31 – 2012-01-03 (×4): 300 mg via ORAL
  Filled 2011-12-31 (×4): qty 1

## 2011-12-31 MED ORDER — ESTRADIOL 2 MG PO TABS
2.0000 mg | ORAL_TABLET | Freq: Every day | ORAL | Status: DC
  Administered 2011-12-31 – 2012-01-03 (×4): 2 mg via ORAL
  Filled 2011-12-31 (×4): qty 1

## 2011-12-31 MED ORDER — ASPIRIN 81 MG PO TABS: 81.0000 mg | ORAL_TABLET | Freq: Every day | ORAL | Status: DC

## 2011-12-31 MED ORDER — ASPIRIN 81 MG PO CHEW
81.0000 mg | CHEWABLE_TABLET | Freq: Every day | ORAL | Status: DC
  Administered 2011-12-31 – 2012-01-03 (×4): 81 mg via ORAL
  Filled 2011-12-31 (×4): qty 1

## 2011-12-31 MED ORDER — PANTOPRAZOLE SODIUM 40 MG PO TBEC
40.0000 mg | DELAYED_RELEASE_TABLET | Freq: Every day | ORAL | Status: DC
  Administered 2011-12-31 – 2012-01-02 (×3): 40 mg via ORAL
  Filled 2011-12-31 (×2): qty 1

## 2011-12-31 MED ORDER — LOSARTAN POTASSIUM 50 MG PO TABS
50.0000 mg | ORAL_TABLET | Freq: Every day | ORAL | Status: DC
  Administered 2011-12-31 – 2012-01-03 (×4): 50 mg via ORAL
  Filled 2011-12-31 (×4): qty 1

## 2011-12-31 MED ORDER — FUROSEMIDE 20 MG PO TABS
20.0000 mg | ORAL_TABLET | Freq: Every day | ORAL | Status: DC
  Administered 2011-12-31 – 2012-01-03 (×4): 20 mg via ORAL
  Filled 2011-12-31 (×4): qty 1

## 2011-12-31 MED ORDER — ENOXAPARIN SODIUM 40 MG/0.4ML ~~LOC~~ SOLN
40.0000 mg | SUBCUTANEOUS | Status: DC
  Administered 2011-12-31 – 2012-01-03 (×4): 40 mg via SUBCUTANEOUS
  Filled 2011-12-31 (×4): qty 0.4

## 2011-12-31 MED ORDER — DEXTROSE 5 % IV SOLN
1.0000 g | INTRAVENOUS | Status: DC
  Administered 2011-12-31 – 2012-01-01 (×2): 1 g via INTRAVENOUS
  Filled 2011-12-31 (×2): qty 10

## 2011-12-31 MED ORDER — CHOLECALCIFEROL 25 MCG (1000 UT) PO TABS: 2000.0000 [IU] | ORAL_TABLET | Freq: Every day | ORAL | Status: DC

## 2011-12-31 MED ORDER — VENLAFAXINE HCL ER 150 MG PO CP24
150.0000 mg | ORAL_CAPSULE | Freq: Two times a day (BID) | ORAL | Status: DC
  Administered 2011-12-31 – 2012-01-03 (×8): 150 mg via ORAL
  Filled 2011-12-31 (×8): qty 1

## 2011-12-31 MED ORDER — POTASSIUM CHLORIDE CRYS ER 20 MEQ PO TBCR
20.0000 meq | EXTENDED_RELEASE_TABLET | Freq: Three times a day (TID) | ORAL | Status: DC
  Administered 2011-12-31 – 2012-01-03 (×11): 20 meq via ORAL
  Filled 2011-12-31 (×13): qty 1

## 2011-12-31 MED ORDER — HYDROCOD POLST-CHLORPHEN POLST 10-8 MG/5ML PO LQCR: 5.0000 mL | Freq: Two times a day (BID) | ORAL | Status: DC | PRN

## 2011-12-31 MED ORDER — AZITHROMYCIN 500 MG IV SOLR
500.0000 mg | INTRAVENOUS | Status: DC
  Administered 2011-12-31 – 2012-01-01 (×2): 500 mg via INTRAVENOUS
  Filled 2011-12-31 (×2): qty 500

## 2011-12-31 MED ORDER — ATENOLOL 50 MG PO TABS
50.0000 mg | ORAL_TABLET | Freq: Every day | ORAL | Status: DC
  Administered 2011-12-31 – 2012-01-03 (×4): 50 mg via ORAL
  Filled 2011-12-31 (×4): qty 1

## 2011-12-31 MED ORDER — INSULIN GLARGINE 100 UNIT/ML ~~LOC~~ SOLN
10.0000 [IU] | Freq: Every day | SUBCUTANEOUS | Status: DC
  Administered 2011-12-31 – 2012-01-02 (×3): 10 [IU] via SUBCUTANEOUS

## 2011-12-31 MED ORDER — METHYLPREDNISOLONE SODIUM SUCC 125 MG IJ SOLR
60.0000 mg | Freq: Four times a day (QID) | INTRAMUSCULAR | Status: DC
  Administered 2011-12-31 – 2012-01-01 (×4): 60 mg via INTRAVENOUS
  Administered 2012-01-01: 125 mg via INTRAVENOUS
  Filled 2011-12-31 (×8): qty 0.96
  Filled 2011-12-31: qty 2

## 2011-12-31 MED ORDER — INSULIN ASPART 100 UNIT/ML ~~LOC~~ SOLN
0.0000 [IU] | Freq: Three times a day (TID) | SUBCUTANEOUS | Status: DC
  Administered 2011-12-31 (×2): 7 [IU] via SUBCUTANEOUS
  Administered 2011-12-31: 11 [IU] via SUBCUTANEOUS
  Administered 2012-01-01: 7 [IU] via SUBCUTANEOUS
  Administered 2012-01-01: 11 [IU] via SUBCUTANEOUS
  Administered 2012-01-01 – 2012-01-02 (×2): 7 [IU] via SUBCUTANEOUS
  Administered 2012-01-02: 11 [IU] via SUBCUTANEOUS

## 2011-12-31 MED ORDER — IPRATROPIUM BROMIDE 0.02 % IN SOLN
0.5000 mg | Freq: Four times a day (QID) | RESPIRATORY_TRACT | Status: DC
  Administered 2011-12-31 – 2012-01-02 (×10): 0.5 mg via RESPIRATORY_TRACT
  Filled 2011-12-31 (×8): qty 2.5

## 2011-12-31 MED ORDER — NITROGLYCERIN 0.4 MG SL SUBL: 0.4000 mg | SUBLINGUAL_TABLET | SUBLINGUAL | Status: DC | PRN

## 2011-12-31 MED ORDER — CICLESONIDE 50 MCG/ACT NA SUSP: 1.0000 | Freq: Every day | NASAL | Status: DC

## 2011-12-31 MED ORDER — CLONIDINE HCL 0.1 MG PO TABS
0.1000 mg | ORAL_TABLET | Freq: Every day | ORAL | Status: DC
  Administered 2011-12-31 – 2012-01-03 (×4): 0.1 mg via ORAL
  Filled 2011-12-31 (×4): qty 1

## 2011-12-31 MED ORDER — FLUTICASONE PROPIONATE 50 MCG/ACT NA SUSP
1.0000 | Freq: Every day | NASAL | Status: DC
  Administered 2011-12-31 – 2012-01-03 (×4): 1 via NASAL
  Filled 2011-12-31: qty 16

## 2011-12-31 MED ORDER — ALBUTEROL SULFATE (5 MG/ML) 0.5% IN NEBU
2.5000 mg | INHALATION_SOLUTION | Freq: Four times a day (QID) | RESPIRATORY_TRACT | Status: DC
  Administered 2011-12-31 – 2012-01-02 (×10): 2.5 mg via RESPIRATORY_TRACT
  Filled 2011-12-31 (×10): qty 0.5

## 2011-12-31 MED ORDER — ALBUTEROL SULFATE (5 MG/ML) 0.5% IN NEBU: 2.5000 mg | INHALATION_SOLUTION | RESPIRATORY_TRACT | Status: DC | PRN

## 2011-12-31 MED ORDER — INSULIN ASPART 100 UNIT/ML ~~LOC~~ SOLN
0.0000 [IU] | Freq: Every day | SUBCUTANEOUS | Status: DC
  Administered 2011-12-31: 2 [IU] via SUBCUTANEOUS

## 2011-12-31 NOTE — Telephone Encounter (Signed)
Per MD patient needs post hospital fup. lmo cell for pt to callback to sch

## 2011-12-31 NOTE — Progress Notes (Signed)
   CARE MANAGEMENT NOTE 12/31/2011  Patient:  [name], [name]   Account Number:  [id number]  Date Initiated:  12/31/2011  Documentation initiated by:  Letha Cape  Subjective/Objective Assessment:   dx sob, copd ex  admit- lives with spouse.     Action/Plan:   Anticipated DC Date:  01/03/2012   Anticipated DC Plan:  HOME/SELF CARE      DC Planning Services  CM consult      Choice offered to / List presented to:             Status of service:  In process, will continue to follow Medicare Important Message given?   (If response is "NO", the following Medicare IM given date fields will be blank) Date Medicare IM given:   Date Additional Medicare IM given:    Discharge Disposition:    Per UR Regulation:    If discussed at Long Length of Stay Meetings, dates discussed:    Comments:  12/31/11 16:26 Letha Cape RN, BSN [phone] patient lives with spouse.  NCM will continue to follow for dc needs.

## 2011-12-31 NOTE — Progress Notes (Signed)
Inpatient Diabetes Program Recommendations  AACE/ADA: New Consensus Statement on Inpatient Glycemic Control (2009)  Target Ranges:  Prepandial:   less than 140 mg/dL      Peak postprandial:   less than 180 mg/dL (1-2 hours)      Critically ill patients:  140 - 180 mg/dL   CBGs today: 829/ 562 mg/dl  Inpatient Diabetes Program Recommendations Insulin - Basal: Patient on IV steroids- Please increase Lantus to 15 units QHS.  Note: Will follow. Ambrose Finland RN, MSN, CDE Diabetes Coordinator Inpatient Diabetes Program [phone]

## 2011-12-31 NOTE — ED Notes (Signed)
Report given to [facility] on floor and patient moved.

## 2011-12-31 NOTE — Progress Notes (Signed)
Subjective: Breathing better, still with some SOB/wheezing Requiring 2L O2   Objective: Vital signs in last 24 hours: Filed Vitals:   12/31/11 0120 12/31/11 0255 12/31/11 0630 12/31/11 0918  BP: 166/82  148/82   Pulse: 73 100 72   Temp: 98.4 F (36.9 C)  98.2 F (36.8 C)   TempSrc: Oral  Oral   Resp: 20 18 20    SpO2: 98%  100% 97%   Weight change:   Intake/Output Summary (Last 24 hours) at 12/31/11 1143 Last data filed at 12/31/11 0800  Gross per 24 hour  Intake    360 ml  Output      0 ml  Net    360 ml    Physical Exam: General: Awake, Oriented, No acute distress. HEENT: EOMI. Neck: Supple CV: S1 and S2, rrr Lungs: wheezing bilaterally Abdomen: Soft, Nontender, Nondistended, +bowel sounds. Ext: Good pulses. Trace edema.   Lab Results:  Basename 12/31/11 0530 12/30/11 1544  NA 139 140  K 3.8 4.0  CL 99 101  CO2 29 --  GLUCOSE 169* 330*  BUN 16 17  CREATININE 0.60 0.70  CALCIUM 9.2 --  MG -- --  PHOS -- --   No results found for this basename: AST:2,ALT:2,ALKPHOS:2,BILITOT:2,PROT:2,ALBUMIN:2 in the last 72 hours No results found for this basename: LIPASE:2,AMYLASE:2 in the last 72 hours  Basename 12/31/11 0530 12/30/11 1544 12/30/11 1348  WBC 12.7* -- 17.2*  NEUTROABS -- -- 14.9*  HGB 10.7* 12.9 --  HCT 32.2* 38.0 --  MCV 93.1 -- 91.2  PLT 295 -- 373   No results found for this basename: CKTOTAL:3,CKMB:3,CKMBINDEX:3,TROPONINI:3 in the last 72 hours No components found with this basename: POCBNP:3 No results found for this basename: DDIMER:2 in the last 72 hours No results found for this basename: HGBA1C:2 in the last 72 hours No results found for this basename: CHOL:2,HDL:2,LDLCALC:2,TRIG:2,CHOLHDL:2,LDLDIRECT:2 in the last 72 hours No results found for this basename: TSH,T4TOTAL,FREET3,T3FREE,THYROIDAB in the last 72 hours No results found for this basename: VITAMINB12:2,FOLATE:2,FERRITIN:2,TIBC:2,IRON:2,RETICCTPCT:2 in the last 72 hours  Micro  Results: No results found for this or any previous visit (from the past 240 hour(s)).  Studies/Results: Dg Chest 2 View  12/30/2011  *RADIOLOGY REPORT*  Clinical Data: Fall.  SOB  CHEST - 2 VIEW  Comparison: 07/23/2012  Findings: Heart size is mildly enlarged.  No pleural effusion or edema.  No airspace consolidation identified.  Review of the visualized osseous structures is unremarkable.  IMPRESSION:  1.  No acute cardiopulmonary abnormalities.  Original Report Authenticated By: Rosealee Albee, M.D.   Ct Angio Chest W/cm &/or Wo Cm  12/30/2011  *RADIOLOGY REPORT*  Clinical Data: Chest pain and shortness of breath.  CT ANGIOGRAPHY CHEST  Technique:  Multidetector CT imaging of the chest using the standard protocol during bolus administration of intravenous contrast. Multiplanar reconstructed images including MIPs were obtained and reviewed to evaluate the vascular anatomy.  Contrast: OMNIPAQUE IOHEXOL 350 MG/ML IV SOLN  Comparison: 09/09/2011.  Findings: No pulmonary embolus.  No pathologically enlarged mediastinal, hilar or axillary lymph nodes.  Heart is enlarged.  No pericardial effusion. There is focal left coronary artery calcification.  Added density in the lungs bilaterally is likely due to expiratory phase imaging.  Minimal subsegmental atelectasis in the lower lobes.  There may be clustered peribronchovascular nodularity in the left lower lobe (images 34-36), new from the prior study and likely infectious or post infectious in etiology.  No pleural fluid.  Airway is unremarkable.  Incidental imaging  of the upper abdomen shows no acute findings. No worrisome lytic or sclerotic lesions.  IMPRESSION:  1.  No pulmonary embolus. 2.  No acute findings to explain the patient's given symptoms. 3.  Focal left coronary artery calcification.  Original Report Authenticated By: Reyes Ivan, M.D.    Medications: I have reviewed the patient's current medications. Scheduled Meds:   .  acetaminophen  650 mg Oral Once  . albuterol  2.5 mg Nebulization Q6H  . albuterol      . albuterol  10 mg/hr Nebulization Once  . aspirin  81 mg Oral Daily  . atenolol  50 mg Oral Daily  . azithromycin  500 mg Intravenous Q24H  . buPROPion  300 mg Oral Daily  . cefTRIAXone (ROCEPHIN)  IV  1 g Intravenous Q24H  . cholecalciferol  2,000 Units Oral Daily  . cloNIDine  0.1 mg Oral Daily  . enoxaparin  40 mg Subcutaneous Q24H  . estradiol  2 mg Oral Daily  . fenofibrate  54 mg Oral Daily  . fluticasone  1 spray Each Nare Daily  . furosemide  20 mg Oral Daily  . insulin aspart  0-20 Units Subcutaneous TID WC  . insulin aspart  0-5 Units Subcutaneous QHS  . insulin glargine  5 Units Subcutaneous QHS  . ipratropium  0.5 mg Nebulization Q6H  . LORazepam  0.5 mg Intravenous Once  . losartan  50 mg Oral Daily  . magnesium sulfate  2 g Intravenous Once  . methylPREDNISolone (SOLU-MEDROL) injection  60 mg Intravenous Q6H  .  morphine injection  4 mg Intravenous Once  . pantoprazole  40 mg Oral Q1200  . potassium chloride SA  20 mEq Oral TID  . venlafaxine  150 mg Oral BID  . DISCONTD: acetaminophen      . DISCONTD: aspirin  81 mg Oral Daily  . DISCONTD: Cholecalciferol  2,000 Units Oral Daily  . DISCONTD: ciclesonide  1 spray Each Nare Daily   Continuous Infusions:  PRN Meds:.albuterol, albuterol, chlorpheniramine-HYDROcodone, iohexol, ipratropium, nitroGLYCERIN  Assessment/Plan:   Sarcoidosis of lung/asthma- has specialists she see in [place] and here in , steroids/abx- failed treatment in ER   DIABETES MELLITUS, TYPE II- SSI, on steroids so will probably need extra   SOB (shortness of breath)- requiring O2   Cough- non productive     LOS: 1 day  Colson Barco, DO 12/31/2011, 11:43 AM

## 2011-12-31 NOTE — Progress Notes (Signed)
Utilization Review Completed.[name] T3/22/2013   

## 2012-01-01 LAB — GLUCOSE, CAPILLARY
Glucose-Capillary: 254 mg/dL — ABNORMAL HIGH (ref 70–99)
Glucose-Capillary: 230 mg/dL — ABNORMAL HIGH (ref 70–99)
Glucose-Capillary: 274 mg/dL — ABNORMAL HIGH (ref 70–99)
Glucose-Capillary: 245 mg/dL — ABNORMAL HIGH (ref 70–99)

## 2012-01-01 MED ORDER — WHITE PETROLATUM GEL
Status: AC
  Administered 2012-01-01: 17:00:00
  Filled 2012-01-01: qty 5

## 2012-01-01 MED ORDER — METFORMIN HCL 500 MG PO TABS
500.0000 mg | ORAL_TABLET | Freq: Two times a day (BID) | ORAL | Status: DC
  Administered 2012-01-01 – 2012-01-03 (×5): 500 mg via ORAL
  Filled 2012-01-01 (×7): qty 1

## 2012-01-01 MED ORDER — PREDNISONE 50 MG PO TABS
50.0000 mg | ORAL_TABLET | Freq: Every day | ORAL | Status: DC
  Administered 2012-01-02 – 2012-01-03 (×2): 50 mg via ORAL
  Filled 2012-01-01 (×3): qty 1

## 2012-01-01 NOTE — Progress Notes (Signed)
Subjective: Off O2 Wheezing gone Patient says she is better but not back to baseline yet No CP, no fevers, no chills  Objective: Vital signs in last 24 hours: Filed Vitals:   12/31/11 1410 12/31/11 2141 01/01/12 0122 01/01/12 0550  BP: 126/58   167/91  Pulse: 70   73  Temp: 98.4 F (36.9 C)   98 F (36.7 C)  TempSrc: Oral   Oral  Resp: 20   18  SpO2: 97% 97% 99% 98%   Weight change:   Intake/Output Summary (Last 24 hours) at 01/01/12 0854 Last data filed at 01/01/12 0824  Gross per 24 hour  Intake   1060 ml  Output      0 ml  Net   1060 ml    Physical Exam: General: Awake, Oriented, No acute distress. HEENT: EOMI. Neck: Supple CV: S1 and S2, rrr Lungs: CTA x 2, no wheezing Abdomen: Soft, Nontender, Nondistended, +bowel sounds, obese Ext: Good pulses. Trace edema.   Lab Results:  Basename 12/31/11 0530 12/30/11 1544  NA 139 140  K 3.8 4.0  CL 99 101  CO2 29 --  GLUCOSE 169* 330*  BUN 16 17  CREATININE 0.60 0.70  CALCIUM 9.2 --  MG -- --  PHOS -- --   No results found for this basename: AST:2,ALT:2,ALKPHOS:2,BILITOT:2,PROT:2,ALBUMIN:2 in the last 72 hours No results found for this basename: LIPASE:2,AMYLASE:2 in the last 72 hours  Basename 12/31/11 0530 12/30/11 1544 12/30/11 1348  WBC 12.7* -- 17.2*  NEUTROABS -- -- 14.9*  HGB 10.7* 12.9 --  HCT 32.2* 38.0 --  MCV 93.1 -- 91.2  PLT 295 -- 373   No results found for this basename: CKTOTAL:3,CKMB:3,CKMBINDEX:3,TROPONINI:3 in the last 72 hours No components found with this basename: POCBNP:3 No results found for this basename: DDIMER:2 in the last 72 hours No results found for this basename: HGBA1C:2 in the last 72 hours No results found for this basename: CHOL:2,HDL:2,LDLCALC:2,TRIG:2,CHOLHDL:2,LDLDIRECT:2 in the last 72 hours No results found for this basename: TSH,T4TOTAL,FREET3,T3FREE,THYROIDAB in the last 72 hours No results found for this basename:  VITAMINB12:2,FOLATE:2,FERRITIN:2,TIBC:2,IRON:2,RETICCTPCT:2 in the last 72 hours  Micro Results: No results found for this or any previous visit (from the past 240 hour(s)).  Studies/Results: Ct Angio Chest W/cm &/or Wo Cm  12/30/2011  *RADIOLOGY REPORT*  Clinical Data: Chest pain and shortness of breath.  CT ANGIOGRAPHY CHEST  Technique:  Multidetector CT imaging of the chest using the standard protocol during bolus administration of intravenous contrast. Multiplanar reconstructed images including MIPs were obtained and reviewed to evaluate the vascular anatomy.  Contrast: OMNIPAQUE IOHEXOL 350 MG/ML IV SOLN  Comparison: 09/09/2011.  Findings: No pulmonary embolus.  No pathologically enlarged mediastinal, hilar or axillary lymph nodes.  Heart is enlarged.  No pericardial effusion. There is focal left coronary artery calcification.  Added density in the lungs bilaterally is likely due to expiratory phase imaging.  Minimal subsegmental atelectasis in the lower lobes.  There may be clustered peribronchovascular nodularity in the left lower lobe (images 34-36), new from the prior study and likely infectious or post infectious in etiology.  No pleural fluid.  Airway is unremarkable.  Incidental imaging of the upper abdomen shows no acute findings. No worrisome lytic or sclerotic lesions.  IMPRESSION:  1.  No pulmonary embolus. 2.  No acute findings to explain the patient's given symptoms. 3.  Focal left coronary artery calcification.  Original Report Authenticated By: Reyes Ivan, M.D.    Medications: I have reviewed the  patient's current medications. Scheduled Meds:    . albuterol  2.5 mg Nebulization Q6H  . aspirin  81 mg Oral Daily  . atenolol  50 mg Oral Daily  . azithromycin  500 mg Intravenous Q24H  . buPROPion  300 mg Oral Daily  . cefTRIAXone (ROCEPHIN)  IV  1 g Intravenous Q24H  . cholecalciferol  2,000 Units Oral Daily  . cloNIDine  0.1 mg Oral Daily  . enoxaparin  40 mg  Subcutaneous Q24H  . estradiol  2 mg Oral Daily  . fenofibrate  54 mg Oral Daily  . fluticasone  1 spray Each Nare Daily  . furosemide  20 mg Oral Daily  . insulin aspart  0-20 Units Subcutaneous TID WC  . insulin aspart  0-5 Units Subcutaneous QHS  . insulin glargine  10 Units Subcutaneous QHS  . ipratropium  0.5 mg Nebulization Q6H  . losartan  50 mg Oral Daily  . methylPREDNISolone (SOLU-MEDROL) injection  60 mg Intravenous Q6H  . pantoprazole  40 mg Oral Q1200  . potassium chloride SA  20 mEq Oral TID  . venlafaxine  150 mg Oral BID  . DISCONTD: insulin glargine  5 Units Subcutaneous QHS   Continuous Infusions:  PRN Meds:.albuterol, albuterol, chlorpheniramine-HYDROcodone, ipratropium, nitroGLYCERIN  Assessment/Plan:   Sarcoidosis of lung/asthma- has specialists she see in [place] and here in [place], change IV steroids to PO steroids and IV abx to PO abx   DIABETES MELLITUS, TYPE II- SSI, on steroids so will probably need extra lantus, SSI   SOB (shortness of breath)- off O2   Cough- non productive- better     LOS: 2 days  Aquanetta Schwarz, DO 01/01/2012, 8:54 AM

## 2012-01-02 LAB — GLUCOSE, CAPILLARY
Glucose-Capillary: 180 mg/dL — ABNORMAL HIGH (ref 70–99)
Glucose-Capillary: 117 mg/dL — ABNORMAL HIGH (ref 70–99)
Glucose-Capillary: 279 mg/dL — ABNORMAL HIGH (ref 70–99)
Glucose-Capillary: 242 mg/dL — ABNORMAL HIGH (ref 70–99)
Glucose-Capillary: 192 mg/dL — ABNORMAL HIGH (ref 70–99)

## 2012-01-02 MED ORDER — ACETAMINOPHEN 325 MG PO TABS
650.0000 mg | ORAL_TABLET | Freq: Four times a day (QID) | ORAL | Status: DC | PRN
  Administered 2012-01-02: 650 mg via ORAL
  Filled 2012-01-02: qty 2

## 2012-01-02 MED ORDER — ALBUTEROL SULFATE (5 MG/ML) 0.5% IN NEBU
2.5000 mg | INHALATION_SOLUTION | Freq: Two times a day (BID) | RESPIRATORY_TRACT | Status: DC
  Administered 2012-01-02 – 2012-01-03 (×2): 2.5 mg via RESPIRATORY_TRACT
  Filled 2012-01-02: qty 0.5

## 2012-01-02 MED ORDER — IPRATROPIUM BROMIDE 0.02 % IN SOLN
0.5000 mg | Freq: Two times a day (BID) | RESPIRATORY_TRACT | Status: DC
  Administered 2012-01-02 – 2012-01-03 (×2): 0.5 mg via RESPIRATORY_TRACT
  Filled 2012-01-02: qty 2.5

## 2012-01-02 NOTE — Progress Notes (Signed)
Tested patient's oxygenation while ambulating with a portable oximetry probe. Patient ambulated approx 150 feet with RN, and O2 sats maintained around 98% on RA, with a low of 95%. Patient tolerated well, no complications.   Will continue to monitor.  Minor, [name]

## 2012-01-02 NOTE — Progress Notes (Signed)
Subjective: Off O2 Wheezing mildly Patient says she is better but not back to baseline yet about 60% No CP, no fevers, no chills  Objective: Vital signs in last 24 hours: Filed Vitals:   01/01/12 2206 01/02/12 0154 01/02/12 0608 01/02/12 0903  BP: 150/91  155/76   Pulse: 79  74   Temp: 97.7 F (36.5 C)  98.2 F (36.8 C)   TempSrc: Oral  Oral   Resp: 22  18   Weight:      SpO2: 97% 96% 97% 97%   Weight change:   Intake/Output Summary (Last 24 hours) at 01/02/12 0945 Last data filed at 01/02/12 0912  Gross per 24 hour  Intake   1220 ml  Output      0 ml  Net   1220 ml    Physical Exam: General: Awake, Oriented, No acute distress. HEENT: EOMI. Neck: Supple CV: S1 and S2, rrr Lungs: mild expiratory wheezing Abdomen: Soft, Nontender, Nondistended, +bowel sounds, obese Ext: Good pulses. Trace edema.   Lab Results:  Basename 12/31/11 0530 12/30/11 1544  NA 139 140  K 3.8 4.0  CL 99 101  CO2 29 --  GLUCOSE 169* 330*  BUN 16 17  CREATININE 0.60 0.70  CALCIUM 9.2 --  MG -- --  PHOS -- --   No results found for this basename: AST:2,ALT:2,ALKPHOS:2,BILITOT:2,PROT:2,ALBUMIN:2 in the last 72 hours No results found for this basename: LIPASE:2,AMYLASE:2 in the last 72 hours  Basename 12/31/11 0530 12/30/11 1544 12/30/11 1348  WBC 12.7* -- 17.2*  NEUTROABS -- -- 14.9*  HGB 10.7* 12.9 --  HCT 32.2* 38.0 --  MCV 93.1 -- 91.2  PLT 295 -- 373   No results found for this basename: CKTOTAL:3,CKMB:3,CKMBINDEX:3,TROPONINI:3 in the last 72 hours No components found with this basename: POCBNP:3 No results found for this basename: DDIMER:2 in the last 72 hours No results found for this basename: HGBA1C:2 in the last 72 hours No results found for this basename: CHOL:2,HDL:2,LDLCALC:2,TRIG:2,CHOLHDL:2,LDLDIRECT:2 in the last 72 hours No results found for this basename: TSH,T4TOTAL,FREET3,T3FREE,THYROIDAB in the last 72 hours No results found for this basename:  VITAMINB12:2,FOLATE:2,FERRITIN:2,TIBC:2,IRON:2,RETICCTPCT:2 in the last 72 hours  Micro Results: No results found for this or any previous visit (from the past 240 hour(s)).  Studies/Results: No results found.  Medications: I have reviewed the patient's current medications. Scheduled Meds:    . albuterol  2.5 mg Nebulization BID  . aspirin  81 mg Oral Daily  . atenolol  50 mg Oral Daily  . buPROPion  300 mg Oral Daily  . cholecalciferol  2,000 Units Oral Daily  . cloNIDine  0.1 mg Oral Daily  . enoxaparin  40 mg Subcutaneous Q24H  . estradiol  2 mg Oral Daily  . fenofibrate  54 mg Oral Daily  . fluticasone  1 spray Each Nare Daily  . furosemide  20 mg Oral Daily  . insulin aspart  0-20 Units Subcutaneous TID WC  . insulin aspart  0-5 Units Subcutaneous QHS  . insulin glargine  10 Units Subcutaneous QHS  . ipratropium  0.5 mg Nebulization BID  . losartan  50 mg Oral Daily  . metFORMIN  500 mg Oral BID WC  . pantoprazole  40 mg Oral Q1200  . potassium chloride SA  20 mEq Oral TID  . predniSONE  50 mg Oral Q breakfast  . venlafaxine  150 mg Oral BID  . white petrolatum      . DISCONTD: albuterol  2.5 mg Nebulization Q6H  .  DISCONTD: ipratropium  0.5 mg Nebulization Q6H   Continuous Infusions:  PRN Meds:.albuterol, albuterol, chlorpheniramine-HYDROcodone, ipratropium, nitroGLYCERIN  Assessment/Plan:   Sarcoidosis of lung/asthma- has specialists she see in [place] and here in [place], change IV steroids to PO steroids and IV abx to PO abx   DIABETES MELLITUS, TYPE II- SSI, on steroids so will probably need extra lantus, SSI   SOB (shortness of breath)- off O2   Cough- non productive- better  Need to check O2 with ambulation and hope for D/C in AM    LOS: 3 days  Aleea Hendry, DO 01/02/2012, 9:45 AM

## 2012-01-03 LAB — GLUCOSE, CAPILLARY: Glucose-Capillary: 94 mg/dL (ref 70–99)

## 2012-01-03 MED ORDER — PREDNISONE 50 MG PO TABS: ORAL_TABLET | ORAL | Status: AC

## 2012-01-03 MED ORDER — ALBUTEROL SULFATE HFA 108 (90 BASE) MCG/ACT IN AERS: 2.0000 | INHALATION_SPRAY | Freq: Four times a day (QID) | RESPIRATORY_TRACT | Status: DC | PRN

## 2012-01-03 NOTE — Progress Notes (Signed)
Patient is d/c today with condition stable, assesments remained unchanged prior to d/c. She was wheeled down by a volunteer with family members. D/c papers and prescriptions given to her

## 2012-01-03 NOTE — Telephone Encounter (Signed)
[name] sch for 01-06-2012 9.30am. Pt hus is aware

## 2012-01-03 NOTE — Discharge Summary (Signed)
Discharge Summary  [name] MR#: [id number]  DOB:1961-01-16  Date of Admission: 12/30/2011 Date of Discharge: 01/03/2012  Patient's PCP: [name], MD, MD  Attending Physician:Laverta Harnisch   Discharge Diagnoses: Active Problems:  Sarcoidosis of lung  DIABETES MELLITUS, TYPE II  SOB (shortness of breath)  Cough   Brief Admitting History and Physical [name] is a 51 y.o. white female with a history of sarcoidosis followed by Dr. Delford Field. Presents with cough, wheezing and SOB. The current episode started 5 days ago. The problem has been gradually worsening. The cough is non-productive. There has been no fever. Associated symptoms include shortness of breath and wheezing. Pertinent negatives include no chest pain, no chills, no ear congestion, no headaches, no rhinorrhea, no sore throat, no myalgias and no eye redness. Patient called her PCP and got prescriptions for prednisone and amoxicillin. The treatment provided no relief.  Patient was placed in CDU in ER but has not improved and hospitalist were called to admit. CTA negative for PE   Discharge Medications Medication List  As of 01/03/2012  9:20 AM   ASK your doctor about these medications         albuterol (2.5 MG/3ML) 0.083% nebulizer solution   Commonly known as: PROVENTIL   Take 3 mLs (2.5 mg total) by nebulization every 6 (six) hours as needed for wheezing.      amoxicillin 500 MG tablet   Commonly known as: AMOXIL   Take 2 tablets (1,000 mg total) by mouth 2 (two) times daily.      aspirin 81 MG tablet   Take 81 mg by mouth daily.      atenolol 50 MG tablet   Commonly known as: TENORMIN   Take 50 mg by mouth daily.      buPROPion 300 MG 24 hr tablet   Commonly known as: WELLBUTRIN XL   Take 1 tablet (300 mg total) by mouth daily.      chlorpheniramine-HYDROcodone 10-8 MG/5ML Lqcr   Commonly known as: TUSSIONEX   Take 5 mLs by mouth every 12 (twelve) hours as needed. For cough & pain      ciclesonide 50 MCG/ACT nasal spray   Commonly known as: OMNARIS   Place 1 spray into both nostrils daily.      cloNIDine 0.1 MG tablet   Commonly known as: CATAPRES   Take 0.1 mg by mouth daily.      DEXILANT 60 MG capsule   Generic drug: dexlansoprazole   TAKE ONE CAPSULE BY MOUTH ONE TIME DAILY      estradiol 1 MG tablet   Commonly known as: ESTRACE   Take 2 mg by mouth daily.      fenofibrate 145 MG tablet   Commonly known as: TRICOR   Take 145 mg by mouth daily.      furosemide 20 MG tablet   Commonly known as: LASIX   Take 1 tablet (20 mg total) by mouth daily.      glucose blood test strip   Use as instructed      GNP VITAMIN D 1000 UNITS tablet   Generic drug: Cholecalciferol   Take 2,000 Units by mouth daily.      HUMIRA PEN [place]   Inject 40 mg into the skin every 14 (fourteen) days. Injection every 2 weeks.      losartan 50 MG tablet   Commonly known as: COZAAR   Take 50 mg by mouth daily.      metFORMIN 500  MG tablet   Commonly known as: GLUCOPHAGE   Take 1 tablet (500 mg total) by mouth 2 (two) times daily with a meal.      nitroGLYCERIN 0.4 MG SL tablet   Commonly known as: NITROSTAT   Place 0.4 mg under the tongue every 5 (five) minutes as needed. For chest pain      potassium chloride SA 20 MEQ tablet   Commonly known as: K-DUR,KLOR-CON   Take 1 tablet (20 mEq total) by mouth 3 (three) times daily.      predniSONE 10 MG tablet   Commonly known as: DELTASONE   Take 10 mg by mouth daily.      predniSONE 20 MG tablet   Commonly known as: DELTASONE   Take 40 mg by mouth daily. For 5 days, started 12/27/2011      predniSONE 20 MG tablet   Commonly known as: DELTASONE   Take 1 tablet (20 mg total) by mouth daily.      venlafaxine 75 MG 24 hr capsule   Commonly known as: EFFEXOR-XR   Take 150 mg by mouth 2 (two) times daily.            Hospital Course: Sarcoidosis of lung/asthma- has specialists she see in [place] and here in  , steroids, follow up with lung specialists DIABETES MELLITUS, TYPE II- SSI, on steroids so once finished, should return to normal SOB (shortness of breath)- off O2, CTA negative Cough- non productive- better recent knee surgery- stiches to be removed today     Day of Discharge BP 152/65  Pulse 74  Temp(Src) 98 F (36.7 C) (Oral)  Resp 18  Wt 114.533 kg (252 lb 8 oz)  SpO2 96% Patient feeling much better, occasional expiratory wheeze- thinks she is back to her baseline rrr -c/c/e A+Ox3 Results for orders placed during the hospital encounter of 12/30/11 (from the past 48 hour(s))  GLUCOSE, CAPILLARY     Status: Abnormal   Collection Time   01/01/12 11:32 AM      Component Value Range Comment   Glucose-Capillary 274 (*) 70 - 99 (mg/dL)   GLUCOSE, CAPILLARY     Status: Abnormal   Collection Time   01/01/12  4:37 PM      Component Value Range Comment   Glucose-Capillary 245 (*) 70 - 99 (mg/dL)   GLUCOSE, CAPILLARY     Status: Abnormal   Collection Time   01/01/12 10:38 PM      Component Value Range Comment   Glucose-Capillary 180 (*) 70 - 99 (mg/dL)   GLUCOSE, CAPILLARY     Status: Abnormal   Collection Time   01/02/12  7:13 AM      Component Value Range Comment   Glucose-Capillary 117 (*) 70 - 99 (mg/dL)   GLUCOSE, CAPILLARY     Status: Abnormal   Collection Time   01/02/12 11:24 AM      Component Value Range Comment   Glucose-Capillary 279 (*) 70 - 99 (mg/dL)   GLUCOSE, CAPILLARY     Status: Abnormal   Collection Time   01/02/12  4:17 PM      Component Value Range Comment   Glucose-Capillary 242 (*) 70 - 99 (mg/dL)   GLUCOSE, CAPILLARY     Status: Abnormal   Collection Time   01/02/12 10:05 PM      Component Value Range Comment   Glucose-Capillary 192 (*) 70 - 99 (mg/dL)    Comment 1 Documented in Chart  Comment 2 Notify RN     GLUCOSE, CAPILLARY     Status: Normal   Collection Time   01/03/12  7:30 AM      Component Value Range Comment    Glucose-Capillary 94  70 - 99 (mg/dL)     Dg Chest 2 View  1/61/0960  *RADIOLOGY REPORT*  Clinical Data: Fall.  SOB  CHEST - 2 VIEW  Comparison: 07/23/2012  Findings: Heart size is mildly enlarged.  No pleural effusion or edema.  No airspace consolidation identified.  Review of the visualized osseous structures is unremarkable.  IMPRESSION:  1.  No acute cardiopulmonary abnormalities.  Original Report Authenticated By: Rosealee Albee, M.D.   Ct Angio Chest W/cm &/or Wo Cm  12/30/2011  *RADIOLOGY REPORT*  Clinical Data: Chest pain and shortness of breath.  CT ANGIOGRAPHY CHEST  Technique:  Multidetector CT imaging of the chest using the standard protocol during bolus administration of intravenous contrast. Multiplanar reconstructed images including MIPs were obtained and reviewed to evaluate the vascular anatomy.  Contrast: OMNIPAQUE IOHEXOL 350 MG/ML IV SOLN  Comparison: 09/09/2011.  Findings: No pulmonary embolus.  No pathologically enlarged mediastinal, hilar or axillary lymph nodes.  Heart is enlarged.  No pericardial effusion. There is focal left coronary artery calcification.  Added density in the lungs bilaterally is likely due to expiratory phase imaging.  Minimal subsegmental atelectasis in the lower lobes.  There may be clustered peribronchovascular nodularity in the left lower lobe (images 34-36), new from the prior study and likely infectious or post infectious in etiology.  No pleural fluid.  Airway is unremarkable.  Incidental imaging of the upper abdomen shows no acute findings. No worrisome lytic or sclerotic lesions.  IMPRESSION:  1.  No pulmonary embolus. 2.  No acute findings to explain the patient's given symptoms. 3.  Focal left coronary artery calcification.  Original Report Authenticated By: Reyes Ivan, M.D.     Disposition: home  Diet: cardiac/diabetic  Activity: as tolerated, gradually increase   Follow-up Appts: PCP- 1 week Dr. Delford Field (lung doctor)- 1  week Discharge Orders    Future Appointments: Provider: Department: Dept Phone: Center:   02/14/2012 8:45 AM Lindley Magnus, MD [facility] [phone] [facility]       Time spent on discharge, talking to the patient, and coordinating care: 45 mins.   SignedMarlin Canary, DO 01/03/2012, 9:20 AM

## 2012-01-03 NOTE — Progress Notes (Signed)
   CARE MANAGEMENT NOTE 01/03/2012  Patient:  [name], [name]   Account Number:  [id number]  Date Initiated:  12/31/2011  Documentation initiated by:  Letha Cape  Subjective/Objective Assessment:   dx sob, copd ex  admit- lives with spouse.     Action/Plan:   Anticipated DC Date:  01/03/2012   Anticipated DC Plan:  HOME/SELF CARE      DC Planning Services  CM consult      Choice offered to / List presented to:             Status of service:  Completed, signed off Medicare Important Message given?   (If response is "NO", the following Medicare IM given date fields will be blank) Date Medicare IM given:   Date Additional Medicare IM given:    Discharge Disposition:  HOME/SELF CARE  Per UR Regulation:    If discussed at Long Length of Stay Meetings, dates discussed:    Comments:  01/03/12- 1100- Donn Pierini RN ,BSN [phone] Pt for discharge today, no d/c needs noted.  12/31/11 16:26 Letha Cape RN, BSN [phone] patient lives with spouse.  NCM will continue to follow for dc needs.

## 2012-01-04 ENCOUNTER — Other Ambulatory Visit: Payer: Self-pay | Admitting: Internal Medicine

## 2012-01-06 ENCOUNTER — Ambulatory Visit (INDEPENDENT_AMBULATORY_CARE_PROVIDER_SITE_OTHER): Payer: BC Managed Care – PPO | Admitting: Internal Medicine

## 2012-01-06 ENCOUNTER — Encounter: Payer: Self-pay | Admitting: Internal Medicine

## 2012-01-06 VITALS — BP 134/82 | HR 80 | Temp 98.2°F | Wt 254.0 lb

## 2012-01-06 DIAGNOSIS — D869 Sarcoidosis, unspecified: Secondary | ICD-10-CM

## 2012-01-06 DIAGNOSIS — D860 Sarcoidosis of lung: Secondary | ICD-10-CM

## 2012-01-06 DIAGNOSIS — J99 Respiratory disorders in diseases classified elsewhere: Secondary | ICD-10-CM | POA: Diagnosis not present

## 2012-01-06 NOTE — Progress Notes (Signed)
Patient ID: GEORGIANA SPILLANE, female   DOB: 12-Feb-1961, 51 y.o.   MRN: [id number]  Recent hospitalization- reviewed records and imaging studies. She feels well Cough resolvied  CBGs on steroids have been less than 150.  Past Medical History  Diagnosis Date  . Sarcoidosis 04/10/2007  . DIABETES MELLITUS, TYPE II 08/21/2006  . HYPERLIPIDEMIA 08/21/2006  . GOUT 08/20/2010  . OBESITY 06/04/2009  . ANEMIA-UNSPECIFIED 09/18/2009  . HYPERTENSION 08/21/2006  . GERD 08/21/2006  . Shortness of breath   . SLEEP APNEA 04/21/2009    last testing was negative    History   Social History  . Marital Status: Married    Spouse Name: N/A    Number of Children: N/A  . Years of Education: N/A   Occupational History  . DISABLED    Social History Main Topics  . Smoking status: Never Smoker   . Smokeless tobacco: Never Used  . Alcohol Use: No  . Drug Use: No  . Sexually Active: Yes   Other Topics Concern  . Not on file   Social History Narrative  . No narrative on file    Past Surgical History  Procedure Date  . Ventral hernia repair   . Nissen fundoplication   . Cholecystectomy   . Abdominal hysterectomy   . Knee arthroscopy     right  . Tubal ligation   . Bladder suspension 11/11/2011    Procedure: TRANSVAGINAL TAPE (TVT) PROCEDURE;  Surgeon: Levi Aland, MD;  Location: [facility];  Service: Gynecology;  Laterality: N/A;  . Cystoscopy 11/11/2011    Procedure: CYSTOSCOPY;  Surgeon: Levi Aland, MD;  Location: [facility];  Service: Gynecology;  Laterality: N/A;    Family History  Problem Relation Age of Onset  . Diabetes Father   . Heart attack Father   . Coronary artery disease Father   . Heart failure Father   . COPD Mother   . Emphysema Mother   . Asthma Mother   . Heart failure Mother   . Sarcoidosis Maternal Uncle   . Colon cancer Neg Hx   . Lung cancer Brother   . Cancer Brother     Allergies  Allergen Reactions  . Lisinopril Cough  . Methotrexate    REACTION: peri-oral and buccal lesions.    Current Outpatient Prescriptions on File Prior to Visit  Medication Sig Dispense Refill  . Adalimumab (HUMIRA PEN [place]) Inject 40 mg into the skin every 14 (fourteen) days. Injection every 2 weeks.      [name] albuterol (PROVENTIL HFA;VENTOLIN HFA) 108 (90 BASE) MCG/ACT inhaler Inhale 2 puffs into the lungs every 6 (six) hours as needed for wheezing.  1 Inhaler  2  . albuterol (PROVENTIL) (2.5 MG/3ML) 0.083% nebulizer solution Take 3 mLs (2.5 mg total) by nebulization every 6 (six) hours as needed for wheezing.  150 mL  1  . aspirin 81 MG tablet Take 81 mg by mouth daily.        [name] atenolol (TENORMIN) 50 MG tablet Take 50 mg by mouth daily.       [name] atorvastatin (LIPITOR) 40 MG tablet Take 1 tablet by mouth daily.      [name] buPROPion (WELLBUTRIN XL) 300 MG 24 hr tablet Take 1 tablet (300 mg total) by mouth daily.  30 tablet  5  . chlorpheniramine-HYDROcodone (TUSSIONEX) 10-8 MG/5ML LQCR Take 5 mLs by mouth every 12 (twelve) hours as needed. For cough & pain      . Cholecalciferol (GNP VITAMIN  D) 1000 UNITS tablet Take 2,000 Units by mouth daily.       . ciclesonide (OMNARIS) 50 MCG/ACT nasal spray Place 1 spray into both nostrils daily.       . cloNIDine (CATAPRES) 0.1 MG tablet Take 0.1 mg by mouth daily.       [name] DETROL LA 4 MG 24 hr capsule Take 1 tablet by mouth every other day.      [name] DEXILANT 60 MG capsule TAKE ONE CAPSULE BY MOUTH ONE TIME DAILY  30 capsule  11  . estradiol (ESTRACE) 1 MG tablet Take 2 mg by mouth daily.       . fenofibrate (TRICOR) 145 MG tablet Take 145 mg by mouth daily.        . furosemide (LASIX) 20 MG tablet Take 1 tablet (20 mg total) by mouth daily.  30 tablet  5  . glucose blood (FREESTYLE LITE) test strip Use as instructed  100 each  12  . losartan (COZAAR) 50 MG tablet Take 50 mg by mouth daily.        . metFORMIN (GLUCOPHAGE) 500 MG tablet TAKE ONE TABLET TWICE DAILY WITH MEALS  60 tablet  4  . nitroGLYCERIN (NITROSTAT) 0.4 MG  SL tablet Place 0.4 mg under the tongue every 5 (five) minutes as needed. For chest pain      . potassium chloride SA (K-DUR,KLOR-CON) 20 MEQ tablet Take 1 tablet (20 mEq total) by mouth 3 (three) times daily.  90 tablet  5  . predniSONE (DELTASONE) 50 MG tablet Take for 5 days and then stop  5 tablet  0  . venlafaxine (EFFEXOR-XR) 75 MG 24 hr capsule Take 150 mg by mouth 2 (two) times daily.       [name] DISCONTD: allopurinol (ZYLOPRIM) 100 MG tablet Take 100 mg by mouth daily.           patient denies chest pain, shortness of breath, orthopnea. Denies lower extremity edema, abdominal pain, change in appetite, change in bowel movements. Patient denies rashes, musculoskeletal complaints. No other specific complaints in a complete review of systems.   BP 134/82  Pulse 80  Temp(Src) 98.2 F (36.8 C) (Oral)  Wt 254 lb (115.214 kg) Overweight,  Well-developed well-nourished female in no acute distress. HEENT exam atraumatic, normocephalic, extraocular muscles are intact. Neck is supple. No jugular venous distention no thyromegaly. Chest clear to auscultation without increased work of breathing. Cardiac exam S1 and S2 are regular. Abdominal exam active bowel sounds, soft, nontender. Extremities no edema.

## 2012-01-06 NOTE — Assessment & Plan Note (Signed)
She feels well Continue steroid taper and f/u with [facility] No change to medications  She has regular f/u scheduled for her multiple other medical problems

## 2012-01-13 ENCOUNTER — Encounter: Payer: Self-pay | Admitting: *Deleted

## 2012-01-13 ENCOUNTER — Other Ambulatory Visit: Payer: Self-pay | Admitting: Critical Care Medicine

## 2012-01-13 ENCOUNTER — Other Ambulatory Visit: Payer: Self-pay | Admitting: Internal Medicine

## 2012-01-13 NOTE — Telephone Encounter (Signed)
Ok to refill 

## 2012-01-13 NOTE — Telephone Encounter (Signed)
Dr. Delford Field, pt was last seen by you 11/09/11.  Since last OV with you, benzonatate has been removed from her med list.  Pls advise if ok to refill this medication.  Thank you.

## 2012-01-13 NOTE — Telephone Encounter (Signed)
Error

## 2012-01-30 ENCOUNTER — Emergency Department (HOSPITAL_COMMUNITY)
Admission: EM | Admit: 2012-01-30 | Discharge: 2012-01-30 | Disposition: A | Discharge status: Home / Self Care | Payer: BC Managed Care – PPO | Attending: Emergency Medicine | Admitting: Emergency Medicine

## 2012-01-30 ENCOUNTER — Encounter (HOSPITAL_COMMUNITY): Payer: Self-pay | Admitting: *Deleted

## 2012-01-30 DIAGNOSIS — K219 Gastro-esophageal reflux disease without esophagitis: Secondary | ICD-10-CM | POA: Insufficient documentation

## 2012-01-30 DIAGNOSIS — E119 Type 2 diabetes mellitus without complications: Secondary | ICD-10-CM | POA: Insufficient documentation

## 2012-01-30 DIAGNOSIS — R062 Wheezing: Secondary | ICD-10-CM | POA: Insufficient documentation

## 2012-01-30 DIAGNOSIS — D869 Sarcoidosis, unspecified: Secondary | ICD-10-CM | POA: Insufficient documentation

## 2012-01-30 DIAGNOSIS — Z7982 Long term (current) use of aspirin: Secondary | ICD-10-CM | POA: Insufficient documentation

## 2012-01-30 DIAGNOSIS — I10 Essential (primary) hypertension: Secondary | ICD-10-CM | POA: Insufficient documentation

## 2012-01-30 DIAGNOSIS — Z79899 Other long term (current) drug therapy: Secondary | ICD-10-CM | POA: Insufficient documentation

## 2012-01-30 DIAGNOSIS — J383 Other diseases of vocal cords: Secondary | ICD-10-CM

## 2012-01-30 MED ORDER — IPRATROPIUM BROMIDE 0.02 % IN SOLN
0.5000 mg | Freq: Once | RESPIRATORY_TRACT | Status: AC
  Administered 2012-01-30: 0.5 mg via RESPIRATORY_TRACT

## 2012-01-30 MED ORDER — ALBUTEROL SULFATE (5 MG/ML) 0.5% IN NEBU
2.5000 mg | INHALATION_SOLUTION | Freq: Once | RESPIRATORY_TRACT | Status: AC
  Administered 2012-01-30: 5 mg via RESPIRATORY_TRACT

## 2012-01-30 MED ORDER — IPRATROPIUM BROMIDE 0.02 % IN SOLN
RESPIRATORY_TRACT | Status: AC
  Filled 2012-01-30: qty 2.5

## 2012-01-30 MED ORDER — SODIUM CHLORIDE 0.9 % IV SOLN
Freq: Once | INTRAVENOUS | Status: AC
  Administered 2012-01-30: 13:00:00 via INTRAVENOUS

## 2012-01-30 MED ORDER — ALBUTEROL SULFATE (5 MG/ML) 0.5% IN NEBU
INHALATION_SOLUTION | RESPIRATORY_TRACT | Status: AC
  Filled 2012-01-30: qty 1

## 2012-01-30 MED ORDER — LORAZEPAM 2 MG/ML IJ SOLN
1.0000 mg | Freq: Once | INTRAMUSCULAR | Status: AC
  Administered 2012-01-30: 1 mg via INTRAVENOUS
  Filled 2012-01-30: qty 1

## 2012-01-30 NOTE — ED Notes (Signed)
Pt reports she was dx 9 years ago w/vocal cord spasms, pt reports receiving minimal hospital treatment for this dx and last time was several years ago. Pt seen here last month for the same but was informed it was more bronchial spasms than vocal cords

## 2012-01-30 NOTE — Discharge Instructions (Signed)
Return here as needed. Follow up with your doctor for a recheck. °

## 2012-01-30 NOTE — ED Provider Notes (Signed)
History     CSN: [id number]  Arrival date & time 01/30/12  1153   First MD Initiated Contact with Patient 01/30/12 1218      Chief Complaint  Patient presents with  . Wheezing    (Consider location/radiation/quality/duration/timing/severity/associated sxs/prior treatment) HPI Patient presents to the emergency department with wheezing that started while at church, this morning.  The patient has had a history of vocal cord dysfunction that causes wheezing that is similar to this.  States that she only gets relief with Ativan.  The patient states that she is not feeling short of breath, but just wheezing and her upper airway.  The patient denies chest pain, weakness, vomiting, nausea, diarrhea, abdominal pain, headache, dizziness, back pain, or fever.  States she has had an episode like this in a while.  She states that this can make the condition worse.  She did try an albuterol inhaler, which did not seem to help with her symptoms. Past Medical History  Diagnosis Date  . Sarcoidosis 04/10/2007  . DIABETES MELLITUS, TYPE II 08/21/2006  . HYPERLIPIDEMIA 08/21/2006  . GOUT 08/20/2010  . OBESITY 06/04/2009  . ANEMIA-UNSPECIFIED 09/18/2009  . HYPERTENSION 08/21/2006  . GERD 08/21/2006  . Shortness of breath   . SLEEP APNEA 04/21/2009    last testing was negative    Past Surgical History  Procedure Date  . Ventral hernia repair   . Nissen fundoplication   . Cholecystectomy   . Abdominal hysterectomy   . Knee arthroscopy     right  . Tubal ligation   . Bladder suspension 11/11/2011    Procedure: TRANSVAGINAL TAPE (TVT) PROCEDURE;  Surgeon: Levi Aland, MD;  Location: [facility];  Service: Gynecology;  Laterality: N/A;  . Cystoscopy 11/11/2011    Procedure: CYSTOSCOPY;  Surgeon: Levi Aland, MD;  Location: [facility];  Service: Gynecology;  Laterality: N/A;    Family History  Problem Relation Age of Onset  . Diabetes Father   . Heart attack Father   . Coronary artery disease  Father   . Heart failure Father   . COPD Mother   . Emphysema Mother   . Asthma Mother   . Heart failure Mother   . Sarcoidosis Maternal Uncle   . Colon cancer Neg Hx   . Lung cancer Brother   . Cancer Brother     History  Substance Use Topics  . Smoking status: Never Smoker   . Smokeless tobacco: Never Used  . Alcohol Use: No    OB History    Grav Para Term Preterm Abortions TAB SAB Ect Mult Living                  Review of Systems All pertinent positives and negatives reviewed in the history of present illness  Allergies  Lisinopril and Methotrexate  Home Medications   Current Outpatient Rx  Name Route Sig Dispense Refill  . ADALIMUMAB 40 MG/0.8ML [place] KIT Subcutaneous Inject 40 mg into the skin every 14 (fourteen) days. Inject every other Friday.    . ALBUTEROL SULFATE HFA 108 (90 BASE) MCG/ACT IN AERS Inhalation Inhale 2 puffs into the lungs every 6 (six) hours as needed. For wheezing.    . ALBUTEROL SULFATE (5 MG/ML) 0.5% IN NEBU Nebulization Take 2.5 mg by nebulization every 6 (six) hours as needed. For wheezing.    . ASPIRIN EC 81 MG PO TBEC Oral Take 81 mg by mouth daily.    . ATENOLOL 50 MG PO TABS Oral  Take 50 mg by mouth daily.    . BUPROPION HCL ER (XL) 300 MG PO TB24 Oral Take 300 mg by mouth daily.    [name] HYDROCOD POLST-CPM POLST ER 10-8 MG/5ML PO LQCR Oral Take 10 mLs by mouth every 12 (twelve) hours as needed. For cough.    [name] VITAMIN D 1000 UNITS PO TABS Oral Take 1,000 Units by mouth 2 (two) times daily.    [name] CICLESONIDE 50 MCG/ACT NA SUSP Each Nare Place 1 spray into both nostrils daily.     [name] CICLESONIDE 50 MCG/ACT NA SUSP Each Nare Place 2 sprays into both nostrils daily.    [name] CLONIDINE HCL 0.1 MG PO TABS Oral Take 0.1 mg by mouth daily.     . DEXLANSOPRAZOLE 60 MG PO CPDR Oral Take 60 mg by mouth daily.    [name] ESTRADIOL 2 MG PO TABS Oral Take 2 mg by mouth daily.    . FENOFIBRATE 145 MG PO TABS Oral Take 145 mg by mouth daily.      . FUROSEMIDE 20 MG  PO TABS Oral Take 20 mg by mouth 2 (two) times daily.    [name] LORAZEPAM 1 MG PO TABS Oral Take 1 mg by mouth every 8 (eight) hours as needed. For anxiety.    [name] LOSARTAN POTASSIUM 50 MG PO TABS Oral Take 50 mg by mouth at bedtime.    [name] METFORMIN HCL 500 MG PO TABS Oral Take 500 mg by mouth 2 (two) times daily with a meal.    . NITROGLYCERIN 0.4 MG SL SUBL Sublingual Place 0.4 mg under the tongue every 5 (five) minutes as needed. For chest pain.    . OXYCODONE-ACETAMINOPHEN 5-325 MG PO TABS Oral Take 1 tablet by mouth as needed.    [name] POTASSIUM CHLORIDE CRYS ER 20 MEQ PO TBCR Oral Take 20 mEq by mouth 3 (three) times daily.    [name] PREDNISONE 10 MG PO TABS Oral Take 10 mg by mouth daily.    . VENLAFAXINE HCL ER 75 MG PO CP24 Oral Take 75 mg by mouth 2 (two) times daily.      BP 128/58  Pulse 75  Temp 97.6 F (36.4 C)  Resp 18  SpO2 96%  Physical Exam  Nursing note and vitals reviewed.  Physical Examination: General appearance - alert, well appearing, and in no distress and oriented to person, place, and time Mental status - alert, oriented to person, place, and time, normal mood, behavior, speech, dress, motor activity, and thought processes Eyes - pupils equal and reactive, extraocular eye movements intact Ears - bilateral TM's and external ear canals normal Nose - normal and patent, no erythema, discharge or polyps Mouth - mucous membranes moist, pharynx normal without lesions Neck - supple, no significant adenopathy Chest - clear to auscultation, no wheezes, rales or rhonchi, symmetric air entry Heart - normal rate, regular rhythm, normal S1, S2, no murmurs, rubs, clicks or gallops The patient has upper airway wheezy type sounds on exam. Her lungs are clear. ED Course  Procedures (including critical care time) She was given a breathing treatment did not help with her symptoms here.  Patient has been given Ativan IV and her symptoms have totally resolved.  She is feeling completely better  at this time.  She is advised to return here for any worsening in her condition.  She is told to follow up with her primary care doctor for a recheck.  No further testing was done based on the back  of the patient's had similar symptoms responded to treatment of Ativan.   Filed Vitals:   01/30/12 1200 01/30/12 1308 01/30/12 1337  BP: 124/77  128/58  Pulse: 77 72 75  Temp: 97.6 F (36.4 C)    Resp: 36 18 18  SpO2: 97% 97% 96%     MDM          Carlyle Dolly, PA-C 01/30/12 1535

## 2012-01-30 NOTE — ED Notes (Addendum)
Pt reports sudden onset of coughing and gagging while in church this am, pt unable to cough anything up, pt seen here for the same 1 month ago, pt dx w/vocal cord spasms approx 9 years ago, pt reports hx of Sarcoidosis, used at home inhalers w/no relief. Bilateral lungs are clear, wheezing noted to upper airway.

## 2012-01-30 NOTE — ED Notes (Signed)
Reports onset of wheezing and sob this am while at church, no relief with inhalers at home, hx of sarcoidosis, spo2 98% at triage, audible wheezing noted, started breathing tx.

## 2012-01-31 NOTE — ED Provider Notes (Signed)
Medical screening examination/treatment/procedure(s) were performed by non-physician practitioner and as supervising physician I was immediately available for consultation/collaboration.  Geoffery Lyons, MD 01/31/12 [phone]

## 2012-02-01 DIAGNOSIS — H1045 Other chronic allergic conjunctivitis: Secondary | ICD-10-CM | POA: Diagnosis not present

## 2012-02-01 DIAGNOSIS — E119 Type 2 diabetes mellitus without complications: Secondary | ICD-10-CM | POA: Diagnosis not present

## 2012-02-01 DIAGNOSIS — H16229 Keratoconjunctivitis sicca, not specified as Sjogren's, unspecified eye: Secondary | ICD-10-CM | POA: Diagnosis not present

## 2012-02-01 DIAGNOSIS — D869 Sarcoidosis, unspecified: Secondary | ICD-10-CM | POA: Diagnosis not present

## 2012-02-08 DIAGNOSIS — J42 Unspecified chronic bronchitis: Secondary | ICD-10-CM | POA: Diagnosis not present

## 2012-02-08 DIAGNOSIS — D869 Sarcoidosis, unspecified: Secondary | ICD-10-CM | POA: Diagnosis not present

## 2012-02-14 ENCOUNTER — Ambulatory Visit (INDEPENDENT_AMBULATORY_CARE_PROVIDER_SITE_OTHER): Payer: BC Managed Care – PPO | Admitting: Internal Medicine

## 2012-02-14 ENCOUNTER — Encounter: Payer: Self-pay | Admitting: Internal Medicine

## 2012-02-14 VITALS — BP 136/80 | Temp 97.8°F | Wt 265.0 lb

## 2012-02-14 DIAGNOSIS — M549 Dorsalgia, unspecified: Secondary | ICD-10-CM

## 2012-02-14 DIAGNOSIS — E669 Obesity, unspecified: Secondary | ICD-10-CM | POA: Diagnosis not present

## 2012-02-14 DIAGNOSIS — E785 Hyperlipidemia, unspecified: Secondary | ICD-10-CM

## 2012-02-14 DIAGNOSIS — E119 Type 2 diabetes mellitus without complications: Secondary | ICD-10-CM

## 2012-02-14 LAB — POCT URINALYSIS DIPSTICK
Bilirubin, UA: NEGATIVE
Blood, UA: NEGATIVE
Glucose, UA: NEGATIVE
Ketones, UA: NEGATIVE
Leukocytes, UA: NEGATIVE
Nitrite, UA: NEGATIVE
Protein, UA: NEGATIVE
Spec Grav, UA: 1.015
Urobilinogen, UA: 0.2
pH, UA: 7

## 2012-02-14 LAB — LDL CHOLESTEROL, DIRECT: Direct LDL: 82.4 mg/dL

## 2012-02-14 LAB — BASIC METABOLIC PANEL
BUN: 12 mg/dL (ref 6–23)
CO2: 28 mEq/L (ref 19–32)
Calcium: 8.7 mg/dL (ref 8.4–10.5)
Chloride: 99 mEq/L (ref 96–112)
Creatinine, Ser: 0.7 mg/dL (ref 0.4–1.2)
GFR: 99.75 mL/min (ref >60.00)
Glucose, Bld: 201 mg/dL — ABNORMAL HIGH (ref 70–99)
Potassium: 4.3 mEq/L (ref 3.5–5.1)
Sodium: 139 mEq/L (ref 135–145)

## 2012-02-14 LAB — HEPATIC FUNCTION PANEL
ALT: 37 U/L — ABNORMAL HIGH (ref 0–35)
AST: 56 U/L — ABNORMAL HIGH (ref 0–37)
Albumin: 3.7 g/dL (ref 3.5–5.2)
Alkaline Phosphatase: 71 U/L (ref 39–117)
Bilirubin, Direct: 0.2 mg/dL (ref 0.0–0.3)
Total Bilirubin: 0.6 mg/dL (ref 0.3–1.2)
Total Protein: 6.8 g/dL (ref 6.0–8.3)

## 2012-02-14 LAB — LIPID PANEL
Cholesterol: 172 mg/dL (ref 0–200)
HDL: 42.1 mg/dL (ref >39.00)
Total CHOL/HDL Ratio: 4
Triglycerides: 437 mg/dL — ABNORMAL HIGH (ref 0.0–149.0)
VLDL: 87.4 mg/dL — ABNORMAL HIGH (ref 0.0–40.0)

## 2012-02-14 LAB — HEMOGLOBIN A1C: Hgb A1c MFr Bld: 8.3 % — ABNORMAL HIGH (ref 4.6–6.5)

## 2012-02-14 NOTE — Assessment & Plan Note (Signed)
Tolerating meds (she is not clear if she is currently on lipitor) Will check labs today

## 2012-02-14 NOTE — Progress Notes (Signed)
Patient ID: [name], female   DOB: [birth year], 51 y.o.   MRN: [id number]   patient comes in for followup of multiple medical problems including type 2 diabetes, hyperlipidemia, hypertension. The patient does not check blood sugar or blood pressure at home. The patetient does not follow an exercise or diet program. The patient denies any polyuria, polydipsia.  In the past the patient has gone to diabetic treatment center. The patient is tolerating medications  Without difficulty. The patient does admit to medication compliance.   Has recurrent LBP-- no radiation of pain  Sarcoid-- has regular f/u with pulmonary  Sinus congestion today (past one week)- no fever or chills, no sweats.  Past Medical History  Diagnosis Date  . Sarcoidosis 04/10/2007  . DIABETES MELLITUS, TYPE II 08/21/2006  . HYPERLIPIDEMIA 08/21/2006  . GOUT 08/20/2010  . OBESITY 06/04/2009  . ANEMIA-UNSPECIFIED 09/18/2009  . HYPERTENSION 08/21/2006  . GERD 08/21/2006  . Shortness of breath   . SLEEP APNEA 04/21/2009    last testing was negative    History   Social History  . Marital Status: Married    Spouse Name: N/A    Number of Children: N/A  . Years of Education: N/A   Occupational History  . DISABLED    Social History Main Topics  . Smoking status: Never Smoker   . Smokeless tobacco: Never Used  . Alcohol Use: No  . Drug Use: No  . Sexually Active: Yes   Other Topics Concern  . Not on file   Social History Narrative  . No narrative on file    Past Surgical History  Procedure Date  . Ventral hernia repair   . Nissen fundoplication   . Cholecystectomy   . Abdominal hysterectomy   . Knee arthroscopy     right  . Tubal ligation   . Bladder suspension 11/11/2011    Procedure: TRANSVAGINAL TAPE (TVT) PROCEDURE;  Surgeon: Levi Aland, MD;  Location: [facility];  Service: Gynecology;  Laterality: N/A;  . Cystoscopy 11/11/2011    Procedure: CYSTOSCOPY;  Surgeon: Levi Aland, MD;  Location:  [facility];  Service: Gynecology;  Laterality: N/A;    Family History  Problem Relation Age of Onset  . Diabetes Father   . Heart attack Father   . Coronary artery disease Father   . Heart failure Father   . COPD Mother   . Emphysema Mother   . Asthma Mother   . Heart failure Mother   . Sarcoidosis Maternal Uncle   . Colon cancer Neg Hx   . Lung cancer Brother   . Cancer Brother     Allergies  Allergen Reactions  . Lisinopril Cough  . Methotrexate     REACTION: peri-oral and buccal lesions.    Current Outpatient Prescriptions on File Prior to Visit  Medication Sig Dispense Refill  . adalimumab (HUMIRA) 40 MG/0.8ML injection Inject 40 mg into the skin every 14 (fourteen) days. Inject every other Friday.      [name] albuterol (PROVENTIL HFA;VENTOLIN HFA) 108 (90 BASE) MCG/ACT inhaler Inhale 2 puffs into the lungs every 6 (six) hours as needed. For wheezing.      [name] albuterol (PROVENTIL) (5 MG/ML) 0.5% nebulizer solution Take 2.5 mg by nebulization every 6 (six) hours as needed. For wheezing.      [name] aspirin EC 81 MG tablet Take 81 mg by mouth daily.      [name] atenolol (TENORMIN) 50 MG tablet Take 50 mg by mouth daily.      [name]  buPROPion (WELLBUTRIN XL) 300 MG 24 hr tablet Take 300 mg by mouth daily.      . chlorpheniramine-HYDROcodone (TUSSIONEX) 10-8 MG/5ML LQCR Take 10 mLs by mouth every 12 (twelve) hours as needed. For cough.      . cholecalciferol (VITAMIN D) 1000 UNITS tablet Take 1,000 Units by mouth 2 (two) times daily.      . cloNIDine (CATAPRES) 0.1 MG tablet Take 0.1 mg by mouth daily.       [name] dexlansoprazole (DEXILANT) 60 MG capsule Take 60 mg by mouth daily.      [name] estradiol (ESTRACE) 2 MG tablet Take 2 mg by mouth daily.      . fenofibrate (TRICOR) 145 MG tablet Take 145 mg by mouth daily.        . furosemide (LASIX) 20 MG tablet Take 20 mg by mouth 2 (two) times daily.      [name] LORazepam (ATIVAN) 1 MG tablet Take 1 mg by mouth every 8 (eight) hours as needed. For anxiety.      [name]  losartan (COZAAR) 50 MG tablet Take 50 mg by mouth at bedtime.      . metFORMIN (GLUCOPHAGE) 500 MG tablet Take 500 mg by mouth 2 (two) times daily with a meal.      . nitroGLYCERIN (NITROSTAT) 0.4 MG SL tablet Place 0.4 mg under the tongue every 5 (five) minutes as needed. For chest pain.      [name] oxyCODONE-acetaminophen (PERCOCET) 5-325 MG per tablet Take 1 tablet by mouth as needed.      . potassium chloride SA (K-DUR,KLOR-CON) 20 MEQ tablet Take 20 mEq by mouth 3 (three) times daily.      . predniSONE (DELTASONE) 10 MG tablet Take 10 mg by mouth daily.      . ciclesonide (OMNARIS) 50 MCG/ACT nasal spray Place 1 spray into both nostrils daily.       [name] venlafaxine XR (EFFEXOR-XR) 75 MG 24 hr capsule Take 75 mg by mouth 2 (two) times daily.      [name] DISCONTD: allopurinol (ZYLOPRIM) 100 MG tablet Take 100 mg by mouth daily.        [name] DISCONTD: ciclesonide (OMNARIS) 50 MCG/ACT nasal spray Place 2 sprays into both nostrils daily.         patient denies chest pain, shortness of breath, orthopnea. Denies lower extremity edema, abdominal pain, change in appetite, change in bowel movements. Patient denies rashes, musculoskeletal complaints. No other specific complaints in a complete review of systems.   BP 136/80  Temp(Src) 97.8 F (36.6 C) (Oral)  Wt 265 lb (120.203 kg)  Well-developed well-nourished female in no acute distress. HEENT exam atraumatic, normocephalic, extraocular muscles are intact. Neck is supple. No jugular venous distention no thyromegaly. Chest clear to auscultation without increased work of breathing. Cardiac exam S1 and S2 are regular. Abdominal exam: overweight active bowel sounds, soft, nontender. Extremities no edema. Neurologic exam she is alert without any motor sensory deficits. Gait is normal.

## 2012-02-14 NOTE — Assessment & Plan Note (Signed)
She understands need to lose weight 

## 2012-02-14 NOTE — Assessment & Plan Note (Signed)
States cbgs are less than 140 Check labs today

## 2012-02-15 ENCOUNTER — Other Ambulatory Visit: Payer: Self-pay | Admitting: *Deleted

## 2012-02-15 MED ORDER — GLYBURIDE 2.5 MG PO TABS: 2.5000 mg | ORAL_TABLET | Freq: Every day | ORAL | Status: DC

## 2012-02-15 MED ORDER — METFORMIN HCL 1000 MG PO TABS: 1000.0000 mg | ORAL_TABLET | Freq: Two times a day (BID) | ORAL | Status: DC

## 2012-03-03 ENCOUNTER — Encounter: Payer: Self-pay | Admitting: Critical Care Medicine

## 2012-03-03 ENCOUNTER — Ambulatory Visit (INDEPENDENT_AMBULATORY_CARE_PROVIDER_SITE_OTHER): Payer: BC Managed Care – PPO | Admitting: Critical Care Medicine

## 2012-03-03 VITALS — BP 132/70 | HR 71 | Temp 98.2°F | Ht 66.0 in | Wt 261.2 lb

## 2012-03-03 DIAGNOSIS — D869 Sarcoidosis, unspecified: Secondary | ICD-10-CM

## 2012-03-03 DIAGNOSIS — D860 Sarcoidosis of lung: Secondary | ICD-10-CM

## 2012-03-03 DIAGNOSIS — J99 Respiratory disorders in diseases classified elsewhere: Secondary | ICD-10-CM | POA: Diagnosis not present

## 2012-03-03 MED ORDER — CHLORPHENIRAMINE TANNATE 12 MG PO TABS: 1.0000 | ORAL_TABLET | Freq: Every day | ORAL | Status: DC

## 2012-03-03 NOTE — Assessment & Plan Note (Signed)
With rreflux disease and associated cyclic cough.  Remicade discontinued April 2012 per rheumatology due to ineffectiveness. Humira started 02/2011 Followed at [facility] Stable at this time Plan Per rheumatology

## 2012-03-03 NOTE — Progress Notes (Signed)
Subjective:    Patient ID: [name], female    DOB: [birth year], 51 y.o.   MRN: [id number]  HPI 51 y.o. WF with Sarcoid, GERD, VCD, cyclical cough   9/26 Doing well ,  Min cough,  Dyspnea ok,  Some pndrip noted.  Occ QHS gasping for breath Pt denies any significant sore throat, nasal congestion or excess secretions, fever, chills, sweats, unintended weight loss, pleurtic or exertional chest pain, orthopnea PND, or leg swelling Pt denies any increase in rescue therapy over baseline, denies waking up needing it or having any early am or nocturnal exacerbations of coughing/wheezing/or dyspnea. Pt also denies any obvious fluctuation in symptoms with  weather or environmental change or other alleviating or aggravating factors  1/29 [facility]  Since last ov 9/12 has been stable.  Not much cough.  No real wheeze, dyspnea is at baseline.  Went to [facility]  For second opinion. No med changes.      03/03/2012 Pt notes more pndrip with pollen and also dyspnea. COugh is controlled.  Eyes are watery and itchy No real wheeze Pt denies any significant sore throat, nasal congestion or excess secretions, fever, chills, sweats, unintended weight loss, pleurtic or exertional chest pain, orthopnea PND, or leg swelling Pt denies any increase in rescue therapy over baseline, denies waking up needing it or having any early am or nocturnal exacerbations of coughing/wheezing/or dyspnea. Pt also denies any obvious fluctuation in symptoms with  weather or environmental change or other alleviating or aggravating factors   Past Medical History  Diagnosis Date  . Sarcoidosis 04/10/2007  . DIABETES MELLITUS, TYPE II 08/21/2006  . HYPERLIPIDEMIA 08/21/2006  . GOUT 08/20/2010  . OBESITY 06/04/2009  . ANEMIA-UNSPECIFIED 09/18/2009  . HYPERTENSION 08/21/2006  . GERD 08/21/2006  . Shortness of breath   . SLEEP APNEA 04/21/2009    last testing was negative     Family  History  Problem Relation Age of Onset  . Diabetes Father   . Heart attack Father   . Coronary artery disease Father   . Heart failure Father   . COPD Mother   . Emphysema Mother   . Asthma Mother   . Heart failure Mother   . Sarcoidosis Maternal Uncle   . Colon cancer Neg Hx   . Lung cancer Brother   . Cancer Brother      History   Social History  . Marital Status: Married    Spouse Name: N/A    Number of Children: N/A  . Years of Education: N/A   Occupational History  . DISABLED    Social History Main Topics  . Smoking status: Never Smoker   . Smokeless tobacco: Never Used  . Alcohol Use: No  . Drug Use: No  . Sexually Active: Yes   Other Topics Concern  . Not on file   Social History Narrative  . No narrative on file     Allergies  Allergen Reactions  . Lisinopril Cough  . Methotrexate     REACTION: peri-oral and buccal lesions.     Outpatient Prescriptions Prior to Visit  Medication Sig Dispense Refill  . adalimumab (HUMIRA) 40 MG/0.8ML injection Inject 40 mg into the skin every 14 (fourteen) days. Inject every other Friday.      [name] albuterol (PROVENTIL HFA;VENTOLIN HFA) 108 (90 BASE) MCG/ACT inhaler Inhale 2 puffs into the lungs every 6 (six) hours as needed. For wheezing.      [name]  albuterol (PROVENTIL) (5 MG/ML) 0.5% nebulizer solution Take 2.5 mg by nebulization every 6 (six) hours as needed. For wheezing.      [name] aspirin EC 81 MG tablet Take 81 mg by mouth daily.      [name] atenolol (TENORMIN) 50 MG tablet Take 50 mg by mouth daily.      . benzonatate (TESSALON) 100 MG capsule Take 100 mg by mouth 3 (three) times daily as needed.       [name] buPROPion (WELLBUTRIN XL) 300 MG 24 hr tablet Take 300 mg by mouth daily.      . chlorpheniramine-HYDROcodone (TUSSIONEX) 10-8 MG/5ML LQCR Take 10 mLs by mouth every 12 (twelve) hours as needed. For cough.      . cholecalciferol (VITAMIN D) 1000 UNITS tablet Take 1,000 Units by mouth 2 (two) times daily.      . ciclesonide  (OMNARIS) 50 MCG/ACT nasal spray Place 1 spray into both nostrils daily.       . cloNIDine (CATAPRES) 0.1 MG tablet Take 0.1 mg by mouth daily.       [name] dexlansoprazole (DEXILANT) 60 MG capsule Take 60 mg by mouth daily.      [name] estradiol (ESTRACE) 2 MG tablet Take 2 mg by mouth daily.      . fenofibrate (TRICOR) 145 MG tablet Take 145 mg by mouth daily.        . furosemide (LASIX) 20 MG tablet Take 20 mg by mouth 2 (two) times daily.      [name] glyBURIDE (DIABETA) 2.5 MG tablet Take 1 tablet (2.5 mg total) by mouth daily with breakfast.  30 tablet  5  . LORazepam (ATIVAN) 1 MG tablet Take 1 mg by mouth every 8 (eight) hours as needed. For anxiety.      [name] losartan (COZAAR) 50 MG tablet Take 50 mg by mouth at bedtime.      . metFORMIN (GLUCOPHAGE) 1000 MG tablet Take 1 tablet (1,000 mg total) by mouth 2 (two) times daily with a meal.  180 tablet  3  . nitroGLYCERIN (NITROSTAT) 0.4 MG SL tablet Place 0.4 mg under the tongue every 5 (five) minutes as needed. For chest pain.      [name] PATADAY 0.2 % SOLN daily.       . potassium chloride SA (K-DUR,KLOR-CON) 20 MEQ tablet Take 20 mEq by mouth 3 (three) times daily.      . predniSONE (DELTASONE) 10 MG tablet Take 10 mg by mouth daily.      [name] venlafaxine XR (EFFEXOR-XR) 75 MG 24 hr capsule Take 75 mg by mouth 2 (two) times daily.         Review of Systems  Constitutional: Negative for fever and unexpected weight change.  HENT: Negative for ear pain, nosebleeds, congestion, sore throat, rhinorrhea, sneezing, trouble swallowing, dental problem, postnasal drip and sinus pressure.   Eyes: Negative for redness and itching.  Respiratory: Positive for cough. Negative for chest tightness, shortness of breath and wheezing.        Cough is improved   Cardiovascular: Negative for palpitations and leg swelling.  Gastrointestinal: Negative for nausea and vomiting.  Genitourinary: Negative for dysuria.  Musculoskeletal: Negative for joint swelling.  Skin: Negative for  rash.  Neurological: Negative for headaches.  Hematological: Does not bruise/bleed easily.  Psychiatric/Behavioral: Negative for dysphoric mood. The patient is not nervous/anxious.        Objective:   Physical Exam  Obese female in nad [place] Vitals:   03/03/12 1137  BP:  132/70  Pulse: 71  Temp: 98.2 F (36.8 C)  TempSrc: Oral  Height: 5\' 6"  (1.676 m)  Weight: 118.48 kg (261 lb 3.2 oz)  SpO2: 99%    Gen: Pleasant, obese  in no distress,  Flat  affect  ENT: No lesions,  mouth clear,  oropharynx clear, no postnasal drip  Neck: No JVD, no TMG, no carotid bruits  Lungs: No use of accessory muscles, no dullness to percussion, clear without rales or rhonchi, mild pseudowheeze  Cardiovascular: RRR, heart sounds normal, no murmur or gallops, no peripheral edema  Abdomen: soft and NT, no HSM,  BS normal  Musculoskeletal: No deformities, no cyanosis or clubbing  Neuro: alert, non focal  Skin: Warm, no lesions or rashes    Assessment & Plan:   Sarcoidosis of lung With rreflux disease and associated cyclic cough.  Remicade discontinued April 2012 per rheumatology due to ineffectiveness. Humira started 02/2011 Followed at [facility] Stable at this time Plan Per rheumatology      Updated Medication List Outpatient Encounter Prescriptions as of 03/03/2012  Medication Sig Dispense Refill  . adalimumab (HUMIRA) 40 MG/0.8ML injection Inject 40 mg into the skin every 14 (fourteen) days. Inject every other Friday.      [name] albuterol (PROVENTIL HFA;VENTOLIN HFA) 108 (90 BASE) MCG/ACT inhaler Inhale 2 puffs into the lungs every 6 (six) hours as needed. For wheezing.      [name] albuterol (PROVENTIL) (5 MG/ML) 0.5% nebulizer solution Take 2.5 mg by nebulization every 6 (six) hours as needed. For wheezing.      [name] aspirin EC 81 MG tablet Take 81 mg by mouth daily.      [name] atenolol (TENORMIN) 50 MG tablet Take 50 mg by mouth daily.      . benzonatate (TESSALON) 100 MG capsule Take 100  mg by mouth 3 (three) times daily as needed.       [name] buPROPion (WELLBUTRIN XL) 300 MG 24 hr tablet Take 300 mg by mouth daily.      . chlorpheniramine-HYDROcodone (TUSSIONEX) 10-8 MG/5ML LQCR Take 10 mLs by mouth every 12 (twelve) hours as needed. For cough.      . cholecalciferol (VITAMIN D) 1000 UNITS tablet Take 1,000 Units by mouth 2 (two) times daily.      . ciclesonide (OMNARIS) 50 MCG/ACT nasal spray Place 1 spray into both nostrils daily.       . cloNIDine (CATAPRES) 0.1 MG tablet Take 0.1 mg by mouth daily.       [name] dexlansoprazole (DEXILANT) 60 MG capsule Take 60 mg by mouth daily.      [name] estradiol (ESTRACE) 2 MG tablet Take 2 mg by mouth daily.      . fenofibrate (TRICOR) 145 MG tablet Take 145 mg by mouth daily.        . furosemide (LASIX) 20 MG tablet Take 20 mg by mouth 2 (two) times daily.      [name] glyBURIDE (DIABETA) 2.5 MG tablet Take 1 tablet (2.5 mg total) by mouth daily with breakfast.  30 tablet  5  . LORazepam (ATIVAN) 1 MG tablet Take 1 mg by mouth every 8 (eight) hours as needed. For anxiety.      [name] losartan (COZAAR) 50 MG tablet Take 50 mg by mouth at bedtime.      . metFORMIN (GLUCOPHAGE) 1000 MG tablet Take 1 tablet (1,000 mg total) by mouth 2 (two) times daily with a meal.  180 tablet  3  . nitroGLYCERIN (NITROSTAT) 0.4 MG  SL tablet Place 0.4 mg under the tongue every 5 (five) minutes as needed. For chest pain.      [name] PATADAY 0.2 % SOLN daily.       . potassium chloride SA (K-DUR,KLOR-CON) 20 MEQ tablet Take 20 mEq by mouth 3 (three) times daily.      . predniSONE (DELTASONE) 10 MG tablet Take 10 mg by mouth daily.      [name] venlafaxine XR (EFFEXOR-XR) 75 MG 24 hr capsule Take 75 mg by mouth 2 (two) times daily.      . Chlorpheniramine Tannate 12 MG TABS Take 1 tablet (12 mg total) by mouth at bedtime.  30 each  6

## 2012-03-03 NOTE — Patient Instructions (Signed)
Use chlorpheniramine 12mg  at bedtime daily, sent to pharmacy No other medication changes Stay on [facility]mn[facility] Return 4 months

## 2012-03-10 ENCOUNTER — Ambulatory Visit: Payer: BC Managed Care – PPO | Admitting: Critical Care Medicine

## 2012-03-15 ENCOUNTER — Ambulatory Visit (INDEPENDENT_AMBULATORY_CARE_PROVIDER_SITE_OTHER): Payer: BC Managed Care – PPO | Admitting: Family

## 2012-03-15 ENCOUNTER — Encounter: Payer: Self-pay | Admitting: Family

## 2012-03-15 VITALS — BP 122/74 | HR 97 | Temp 97.8°F | Wt 262.0 lb

## 2012-03-15 DIAGNOSIS — R079 Chest pain, unspecified: Secondary | ICD-10-CM | POA: Diagnosis not present

## 2012-03-15 DIAGNOSIS — J069 Acute upper respiratory infection, unspecified: Secondary | ICD-10-CM | POA: Diagnosis not present

## 2012-03-15 DIAGNOSIS — D869 Sarcoidosis, unspecified: Secondary | ICD-10-CM | POA: Diagnosis not present

## 2012-03-15 DIAGNOSIS — R059 Cough, unspecified: Secondary | ICD-10-CM

## 2012-03-15 DIAGNOSIS — R05 Cough: Secondary | ICD-10-CM

## 2012-03-15 MED ORDER — PREDNISONE 20 MG PO TABS: 60.0000 mg | ORAL_TABLET | Freq: Every day | ORAL | Status: DC

## 2012-03-15 MED ORDER — HYDROCOD POLST-CHLORPHEN POLST 10-8 MG/5ML PO LQCR: 10.0000 mL | Freq: Two times a day (BID) | ORAL | Status: DC | PRN

## 2012-03-15 NOTE — Progress Notes (Signed)
Subjective:    Patient ID: [name], female    DOB: March 15, 1961, 51 y.o.   MRN: [id number]  HPI  51 year old Caucasian female, nonsmoker, presents today with complaints of nasal congestion and cough. She says symptoms began about 3 days ago. States she feels increased shortness of breath from her baseline. Has been using albuterol inhaler in the morning and night, and her nebulizer at night with some improvement to her shortness of breath and cough. Has a history of sarcoidosis and last saw her pulmonologist at the end of May.    Review of Systems  Constitutional: Negative.  Negative for fever, chills and fatigue.  HENT: Positive for congestion, postnasal drip and sinus pressure. Negative for sore throat.   Eyes: Negative.   Respiratory: Positive for cough, chest tightness and shortness of breath. Negative for wheezing.   Cardiovascular: Negative.  Negative for chest pain and palpitations.  Gastrointestinal: Negative.   Genitourinary: Negative.   Musculoskeletal: Negative.   Skin: Negative.   Neurological: Negative.   Hematological: Negative.   Psychiatric/Behavioral: Negative.    Past Medical History  Diagnosis Date  . Sarcoidosis 04/10/2007  . DIABETES MELLITUS, TYPE II 08/21/2006  . HYPERLIPIDEMIA 08/21/2006  . GOUT 08/20/2010  . OBESITY 06/04/2009  . ANEMIA-UNSPECIFIED 09/18/2009  . HYPERTENSION 08/21/2006  . GERD 08/21/2006  . Shortness of breath   . SLEEP APNEA 04/21/2009    last testing was negative    History   Social History  . Marital Status: Married    Spouse Name: N/A    Number of Children: N/A  . Years of Education: N/A   Occupational History  . DISABLED    Social History Main Topics  . Smoking status: Never Smoker   . Smokeless tobacco: Never Used  . Alcohol Use: No  . Drug Use: No  . Sexually Active: Yes   Other Topics Concern  . Not on file   Social History Narrative  . No narrative on file    Past Surgical History  Procedure Date    . Ventral hernia repair   . Nissen fundoplication   . Cholecystectomy   . Abdominal hysterectomy   . Knee arthroscopy     right  . Tubal ligation   . Bladder suspension 11/11/2011    Procedure: TRANSVAGINAL TAPE (TVT) PROCEDURE;  Surgeon: Levi Aland, MD;  Location: [facility];  Service: Gynecology;  Laterality: N/A;  . Cystoscopy 11/11/2011    Procedure: CYSTOSCOPY;  Surgeon: Levi Aland, MD;  Location: [facility];  Service: Gynecology;  Laterality: N/A;    Family History  Problem Relation Age of Onset  . Diabetes Father   . Heart attack Father   . Coronary artery disease Father   . Heart failure Father   . COPD Mother   . Emphysema Mother   . Asthma Mother   . Heart failure Mother   . Sarcoidosis Maternal Uncle   . Colon cancer Neg Hx   . Lung cancer Brother   . Cancer Brother     Allergies  Allergen Reactions  . Lisinopril Cough  . Methotrexate     REACTION: peri-oral and buccal lesions.    Current Outpatient Prescriptions on File Prior to Visit  Medication Sig Dispense Refill  . adalimumab (HUMIRA) 40 MG/0.8ML injection Inject 40 mg into the skin every 14 (fourteen) days. Inject every other Friday.      [name] albuterol (PROVENTIL HFA;VENTOLIN HFA) 108 (90 BASE) MCG/ACT inhaler Inhale 2 puffs into the  lungs every 6 (six) hours as needed. For wheezing.      [name] albuterol (PROVENTIL) (5 MG/ML) 0.5% nebulizer solution Take 2.5 mg by nebulization every 6 (six) hours as needed. For wheezing.      [name] aspirin EC 81 MG tablet Take 81 mg by mouth daily.      [name] atenolol (TENORMIN) 50 MG tablet Take 50 mg by mouth daily.      . benzonatate (TESSALON) 100 MG capsule Take 100 mg by mouth 3 (three) times daily as needed.       [name] buPROPion (WELLBUTRIN XL) 300 MG 24 hr tablet Take 300 mg by mouth daily.      . Chlorpheniramine Tannate 12 MG TABS Take 1 tablet (12 mg total) by mouth at bedtime.  30 each  6  . cholecalciferol (VITAMIN D) 1000 UNITS tablet Take 1,000 Units by mouth 2 (two)  times daily.      . ciclesonide (OMNARIS) 50 MCG/ACT nasal spray Place 1 spray into both nostrils daily.       . cloNIDine (CATAPRES) 0.1 MG tablet Take 0.1 mg by mouth daily.       [name] dexlansoprazole (DEXILANT) 60 MG capsule Take 60 mg by mouth daily.      [name] estradiol (ESTRACE) 2 MG tablet Take 2 mg by mouth daily.      . fenofibrate (TRICOR) 145 MG tablet Take 145 mg by mouth daily.        . furosemide (LASIX) 20 MG tablet Take 20 mg by mouth 2 (two) times daily.      [name] glyBURIDE (DIABETA) 2.5 MG tablet Take 1 tablet (2.5 mg total) by mouth daily with breakfast.  30 tablet  5  . LORazepam (ATIVAN) 1 MG tablet Take 1 mg by mouth every 8 (eight) hours as needed. For anxiety.      [name] losartan (COZAAR) 50 MG tablet Take 50 mg by mouth at bedtime.      . metFORMIN (GLUCOPHAGE) 1000 MG tablet Take 1 tablet (1,000 mg total) by mouth 2 (two) times daily with a meal.  180 tablet  3  . nitroGLYCERIN (NITROSTAT) 0.4 MG SL tablet Place 0.4 mg under the tongue every 5 (five) minutes as needed. For chest pain.      [name] PATADAY 0.2 % SOLN daily.       . potassium chloride SA (K-DUR,KLOR-CON) 20 MEQ tablet Take 20 mEq by mouth 3 (three) times daily.      . predniSONE (DELTASONE) 10 MG tablet Take 10 mg by mouth daily.      [name] venlafaxine XR (EFFEXOR-XR) 75 MG 24 hr capsule Take 75 mg by mouth 2 (two) times daily.      [name] DISCONTD: allopurinol (ZYLOPRIM) 100 MG tablet Take 100 mg by mouth daily.          BP 122/74  Pulse 97  Temp(Src) 97.8 F (36.6 C) (Oral)  Wt 262 lb (118.842 kg)  SpO2 98%chart    Objective:   Physical Exam  Constitutional: She is oriented to person, place, and time. She appears well-developed and well-nourished.  HENT:  Head: Normocephalic.  Right Ear: External ear normal.  Left Ear: External ear normal.  Nose: Right sinus exhibits maxillary sinus tenderness and frontal sinus tenderness. Left sinus exhibits maxillary sinus tenderness and frontal sinus tenderness.  Mouth/Throat:  Oropharynx is clear and moist. No oropharyngeal exudate.  Neck: Neck supple.  Cardiovascular: Normal rate, regular rhythm and normal heart sounds.  Exam reveals no gallop  and no friction rub.   No murmur heard. Pulmonary/Chest: Breath sounds normal. She has no wheezes. She has no rales.  Abdominal: Soft.  Lymphadenopathy:    She has no cervical adenopathy.  Neurological: She is alert and oriented to person, place, and time.  Skin: Skin is warm.          Assessment & Plan:  Assessment: Upper Respiratory Infection, Cough  Plan: Increase prednisone to 60 mg daily for the next 10 days. Refill Tussionex every 12 hours as needed for cough. Advised to call the office if she develops a fever or if symptoms persist or worsen. Will follow up as needed.

## 2012-03-15 NOTE — Patient Instructions (Signed)

## 2012-03-29 ENCOUNTER — Other Ambulatory Visit: Payer: Self-pay | Admitting: Family

## 2012-03-30 DIAGNOSIS — M109 Gout, unspecified: Secondary | ICD-10-CM | POA: Diagnosis not present

## 2012-04-05 IMAGING — CR DG CHEST 2V
2 series · 2 of 2 positions shown · non-contrast
Comparison: 02/19/2011

CLINICAL DATA: Abdominal pain and distention.  Shortness of breath.
Hypertension.

CHEST - 2 VIEW

[w chest pa]
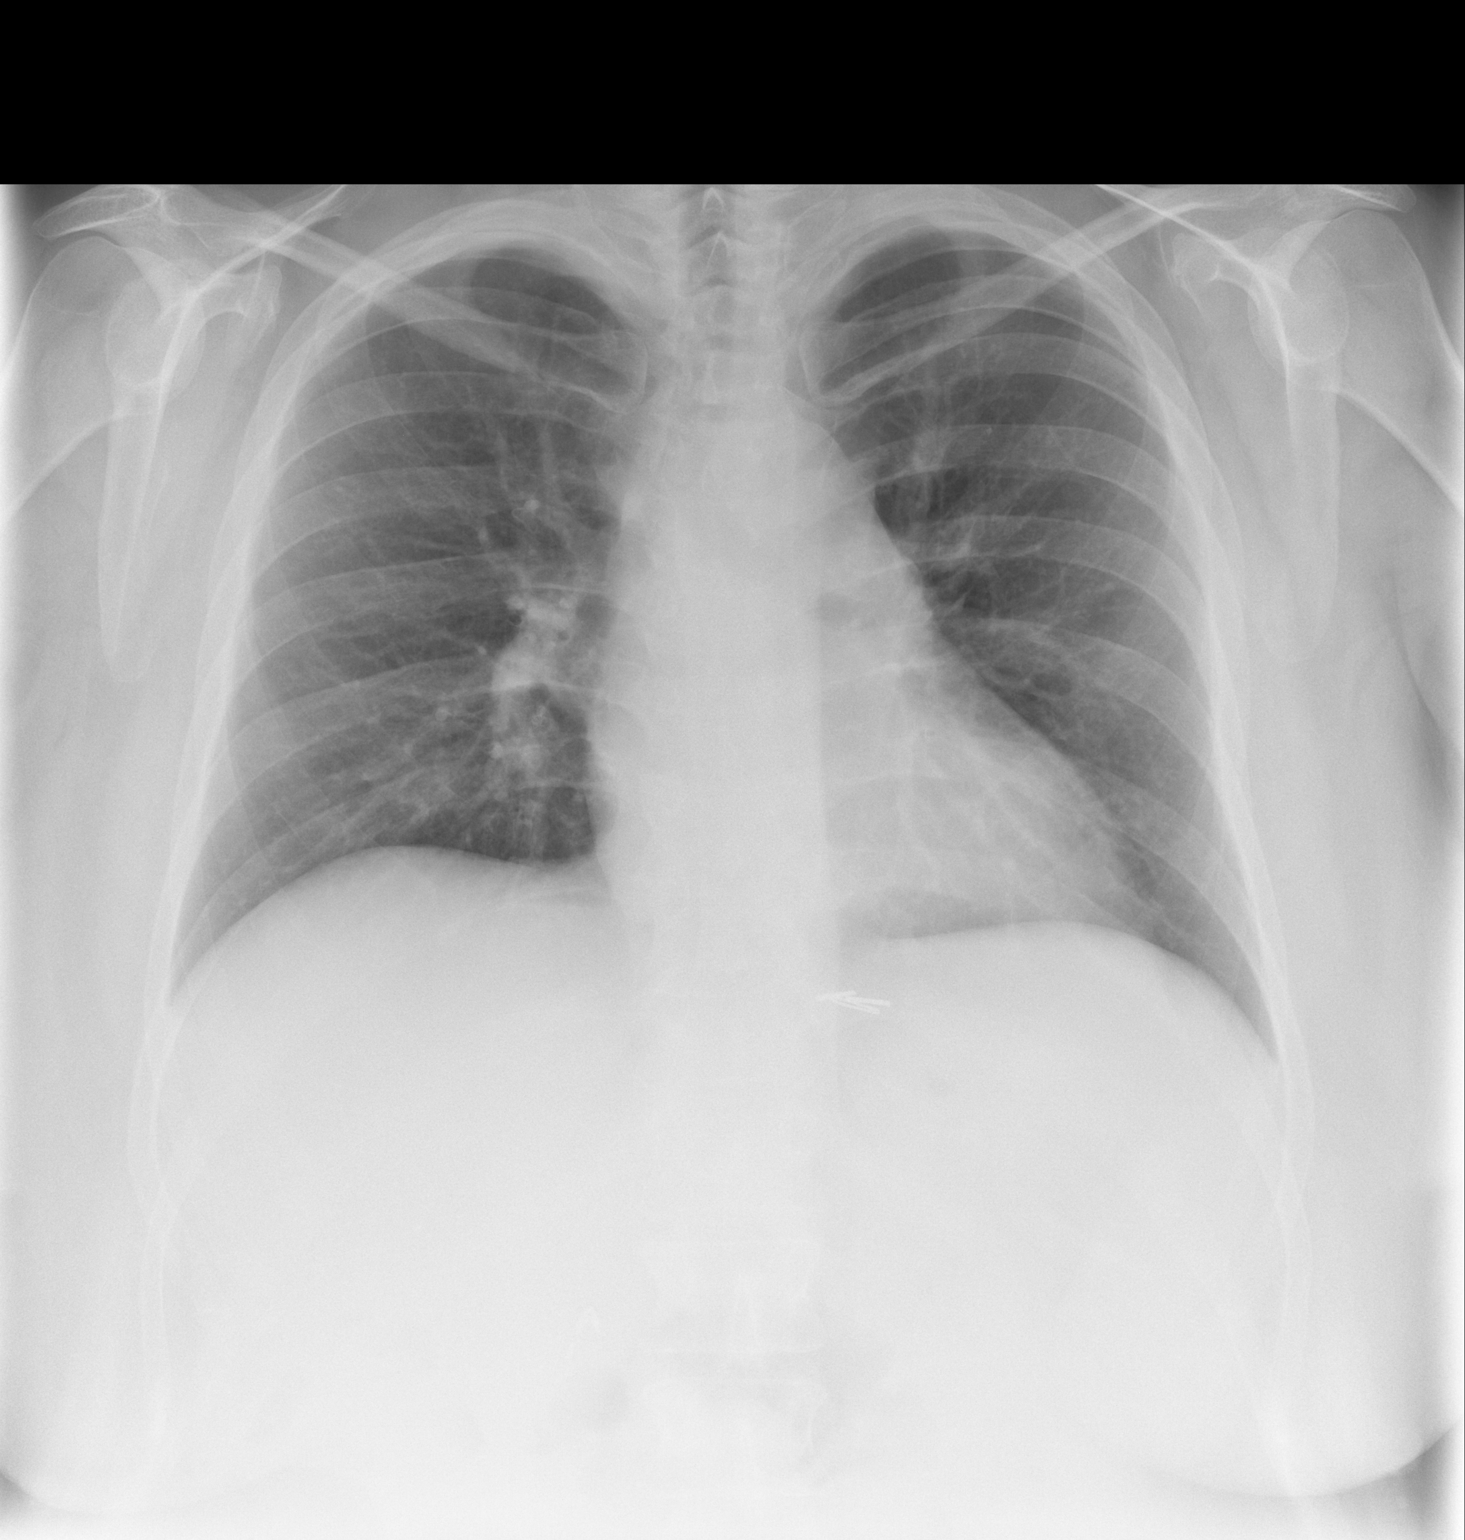

[w chest lat]
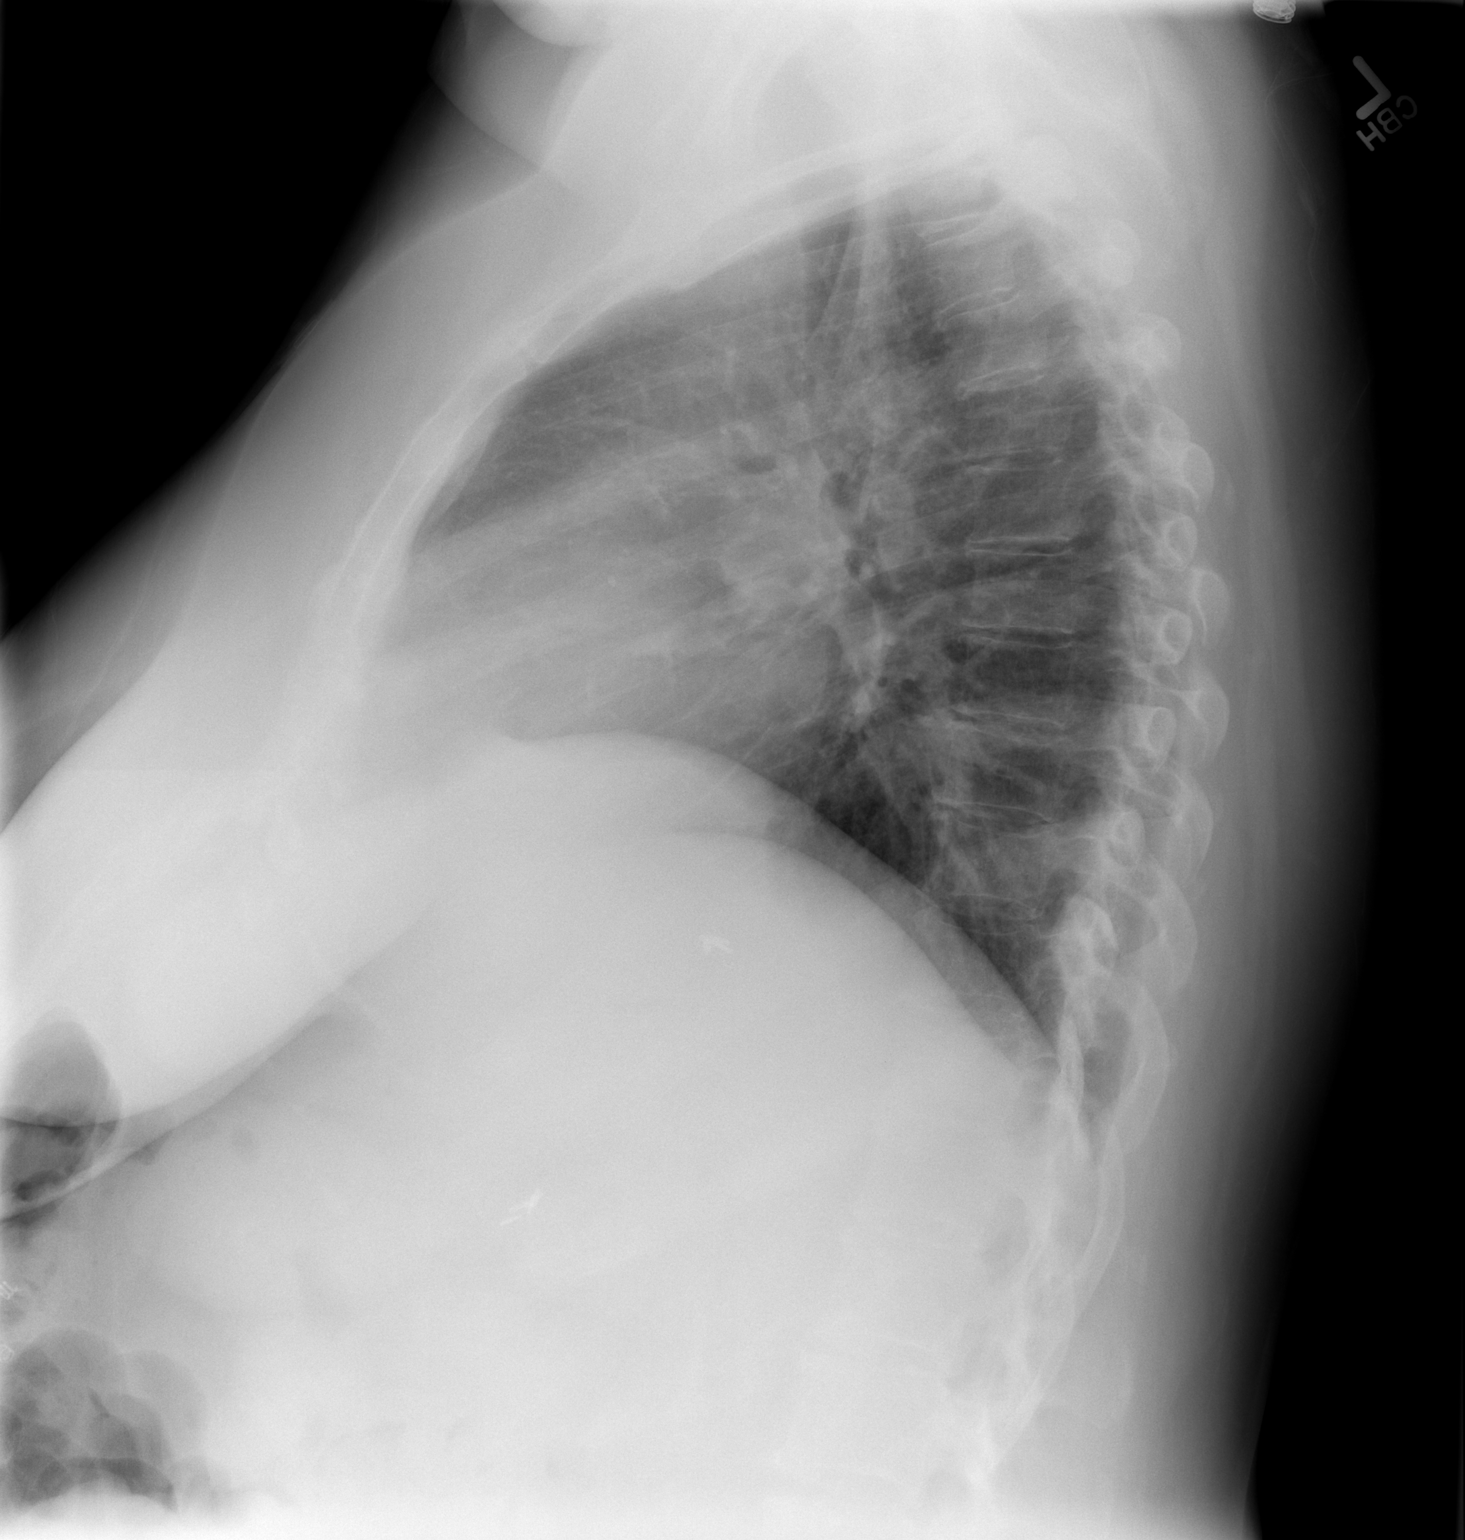

[2 of 2 positions shown; findings below may reference images not displayed]

FINDINGS: Low lung volumes are present, causing crowding of the
pulmonary vasculature.  Cardiac and mediastinal contours appear
normal.

The lungs appear clear.

No pleural effusion is identified.

Mild thoracic spondylosis noted.
IMPRESSION: 1.  No significant abnormality identified.

## 2012-04-05 IMAGING — CT CT ANGIO CHEST
2 of 6 series · 18 of 46 positions shown · IV contrast (omnipaque)
Comparison: Chest CT - 07/17/2010; 04/28/2009

CLINICAL DATA: Chest pain for 3 to 4 days, intermittent shortness
of breath, history of sarcoidosis

CT ANGIOGRAPHY CHEST WITH CONTRAST
TECHNIQUE: Multidetector CT imaging of the chest was performed
using the standard protocol during bolus administration of
intravenous contrast.  Multiplanar CT image reconstructions
including MIPs were obtained to evaluate the vascular anatomy.
Contrast:  100 ml Omnipaque 350

[Series 8: pulm embolism 1.0 b25f thin · axial · 0.65mm/px · z∈[-234,-40]mm · 15 of 214 slices shown]
[im 10/214  lung]
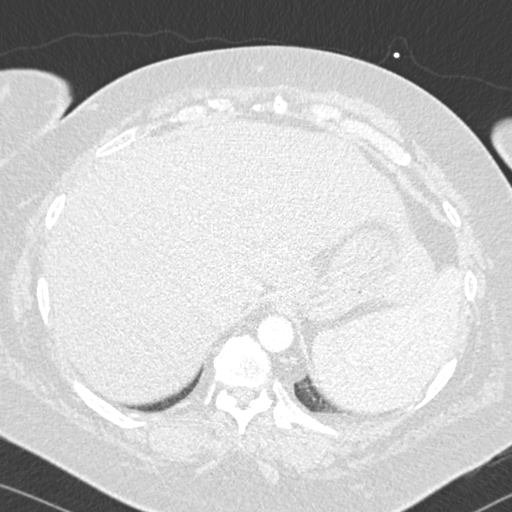
[im 28/214  soft-tissue]
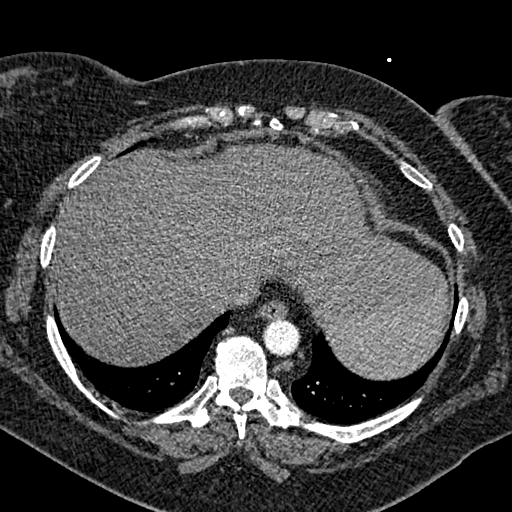
[im 38/214  lung]
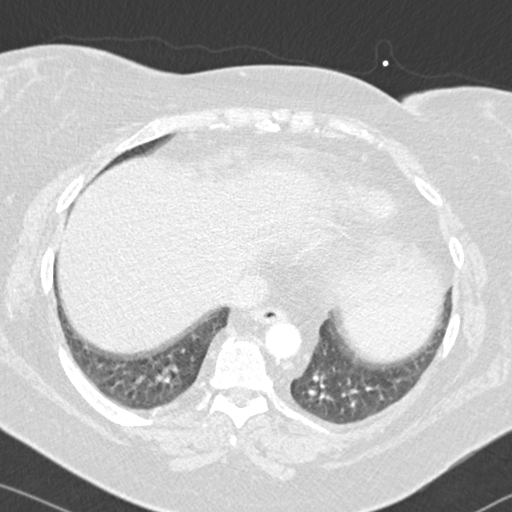
[im 56/214  soft-tissue]
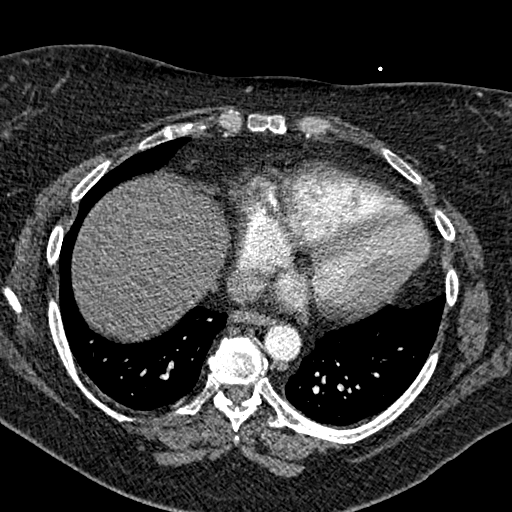
[im 65/214  lung]
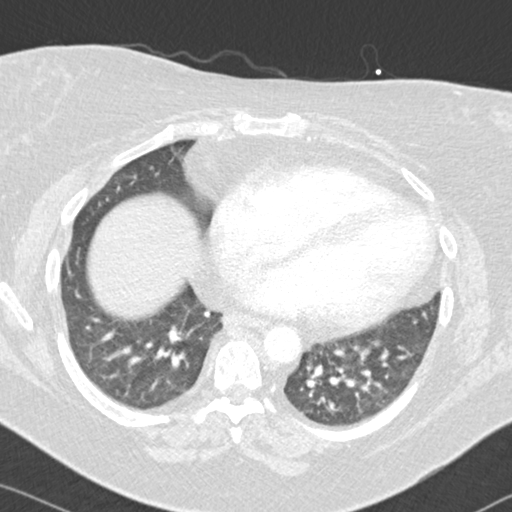
[im 84/214  soft-tissue]
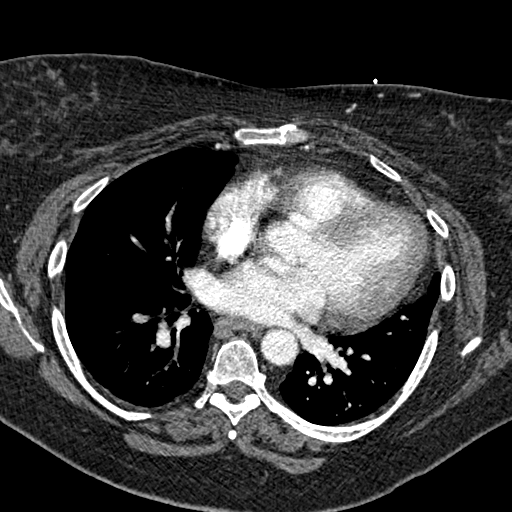
[im 93/214  lung]
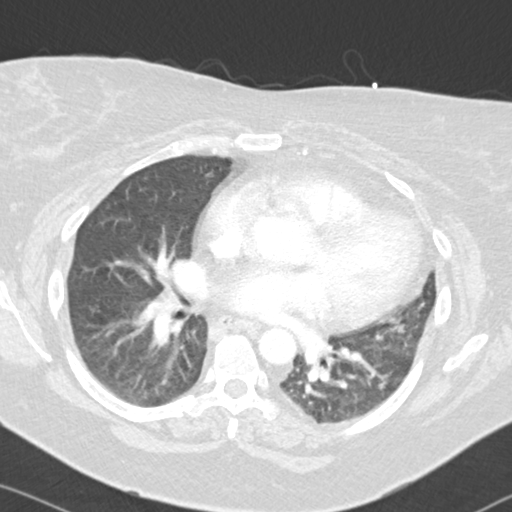
[im 112/214  soft-tissue]
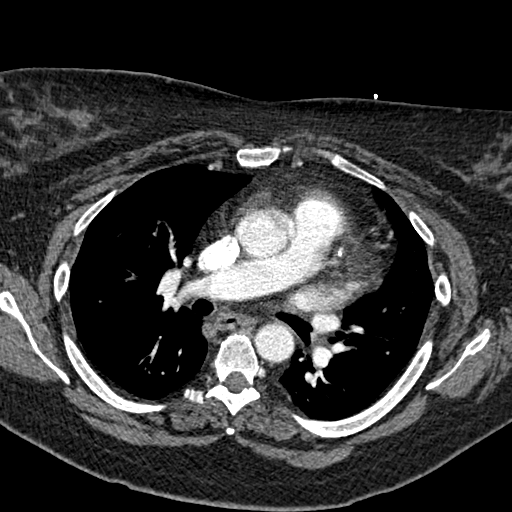
[im 121/214  lung]
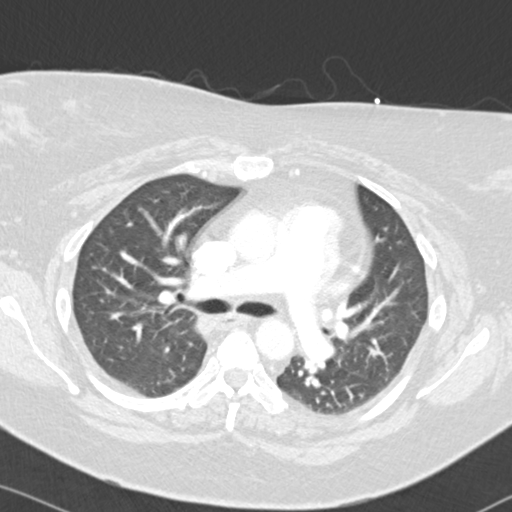
[im 130/214  soft-tissue]
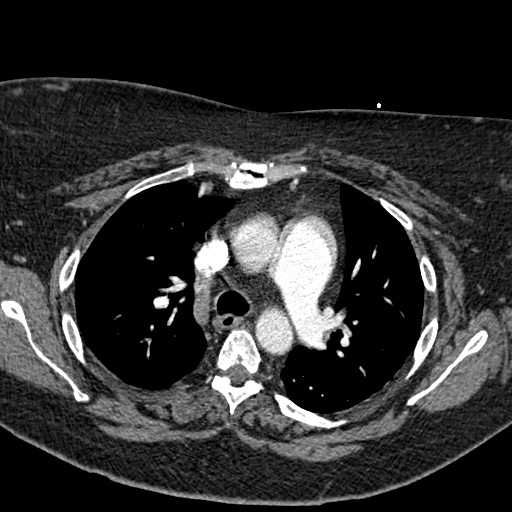
[im 149/214  lung]
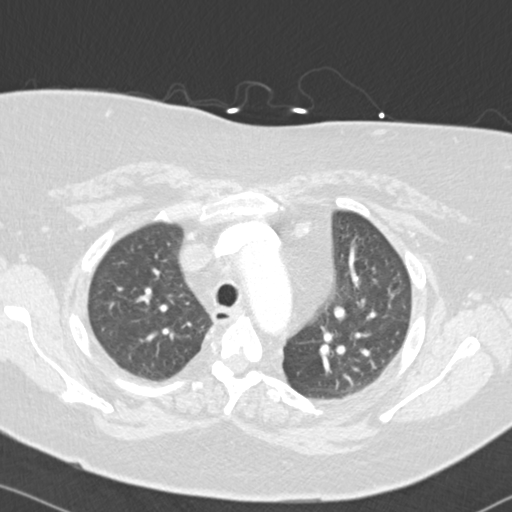
[im 158/214  soft-tissue]
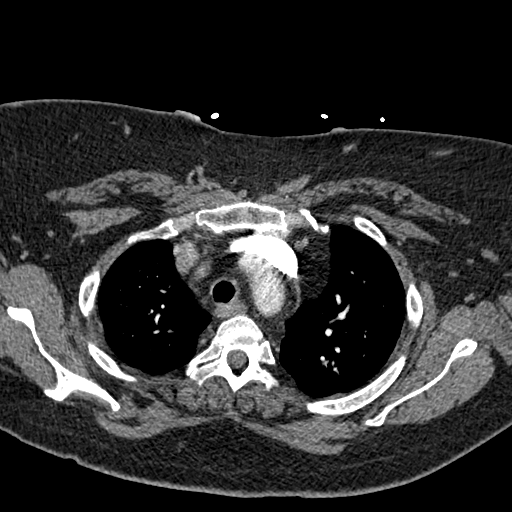
[im 176/214  lung]
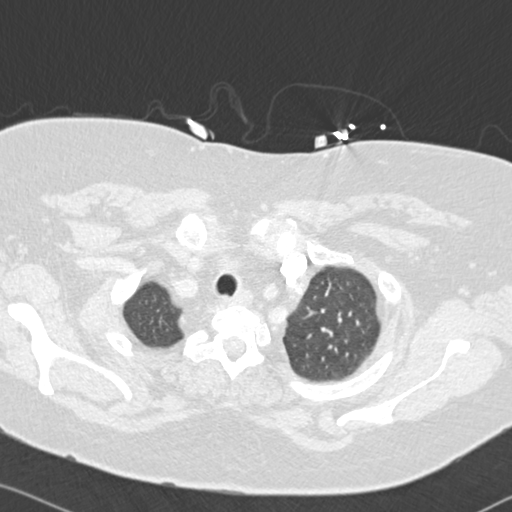
[im 186/214  soft-tissue]
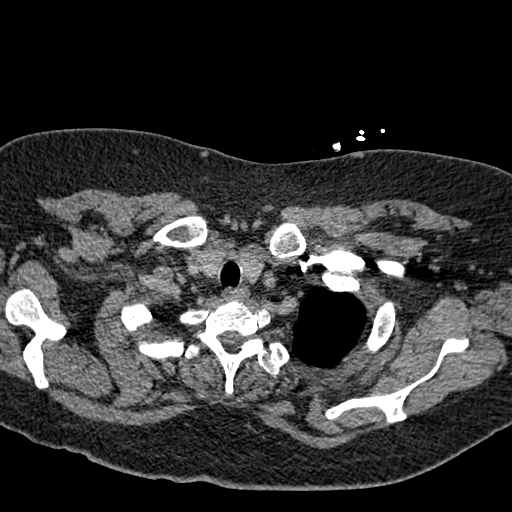
[im 204/214  lung]
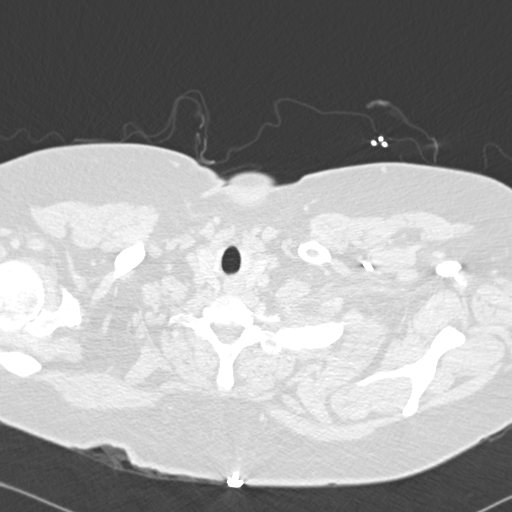

[Series 602: cor · coronal · 0.65mm/px · 3 of 101 slices shown]
[im 26/101  soft-tissue]
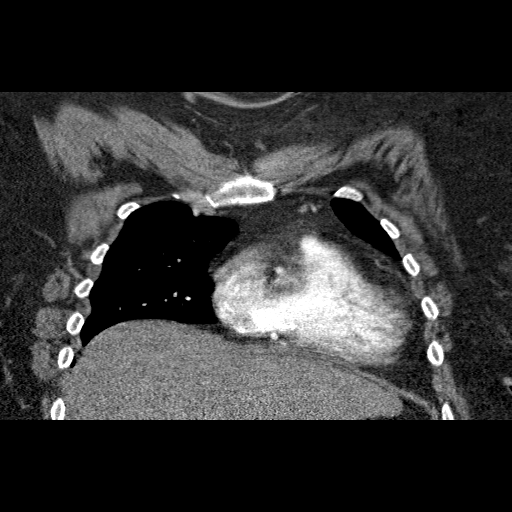
[im 51/101  soft-tissue]
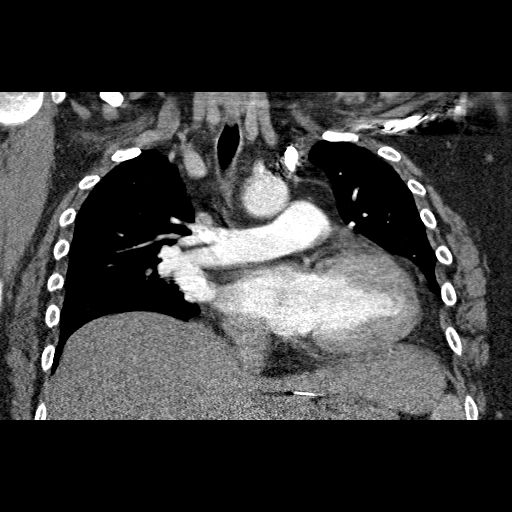
[im 76/101  soft-tissue]
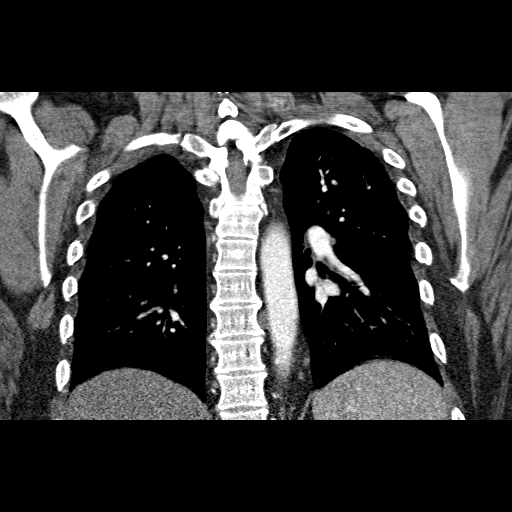

[18 of 46 positions shown; findings below may reference images not displayed]

FINDINGS: There is adequate opacification of the pulmonary arteries with the
main pulmonary artery measuring 260 Hounsfield units, though
examination somewhat limited secondary to patient body habitus and
quantum model artifact.  There are no discrete filling defects in
the main, right, left or major segmental branches of the pulmonary
arteries.  Evaluation of the subsegmental pulmonary arteries is
limited secondary to patient motion artifact.  There is been
interval apparent increase in the caliber of the main pulmonary
artery, now measuring approximate three 3.6 cm in greatest
transverse axial dimension, previously, 3.2 cm, though note, exact
measurements are difficult secondary to pulsatility artifact.

No focal airspace opacity.  Central airways are patent.
Redemonstrated approximately 1.1 x 0.8 cm pneumatocele about the
right major fissure (image 113).  No pneumothorax or pleural
effusion.  No mediastinal, hilar or axillary lymphadenopathy.

Interval apparent increase in cardiac size.  No pericardial
effusion. Normal configuration of the aortic arch.  Normal caliber
of the descending thoracic aorta.

Early arterial phase evaluation of the upper abdomen redemonstrates
surgical clips about the GE junction.  No acute aggressive osseous
abnormalities.

Review of the MIP images confirms the above findings.
IMPRESSION: 1.    No evidence of pulmonary embolism to the level of the
      subsegmental pulmonary arteries.

2.    Interval apparent increase in cardiac size and caliber of the
main pulmonary artery.  Further evaluation may performed with a
cardiac echocardiogram as clinically indicated.
3.    No discrete evidence of pulmonary sarcoidosis.

## 2012-04-05 IMAGING — CR DG ABDOMEN 2V
2 series · 2 of 2 positions shown · non-contrast
Comparison: 02/18/2011

CLINICAL DATA: Abdominal pain.

ABDOMEN - 2 VIEW

[w abdomen upright]
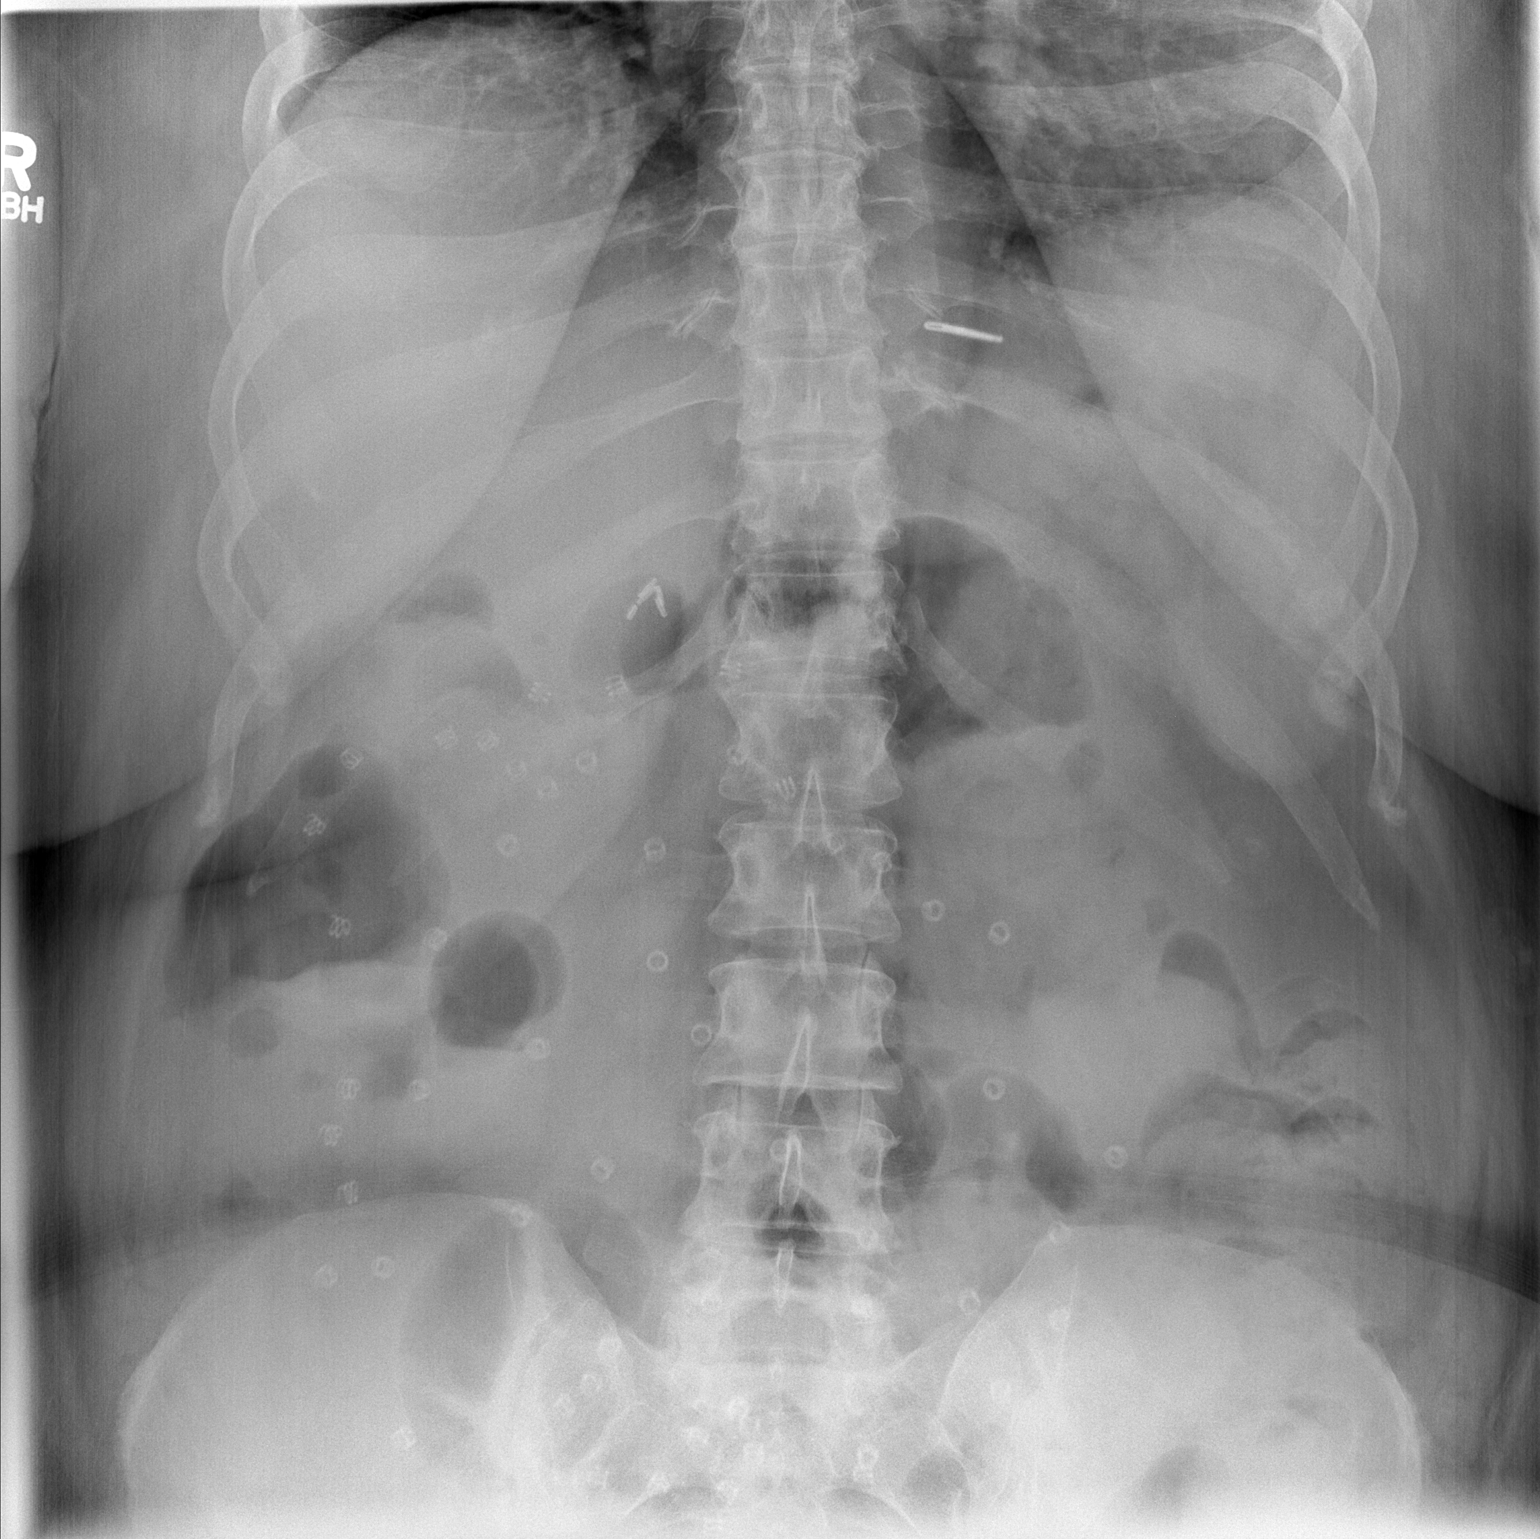

[t abdomen supine]
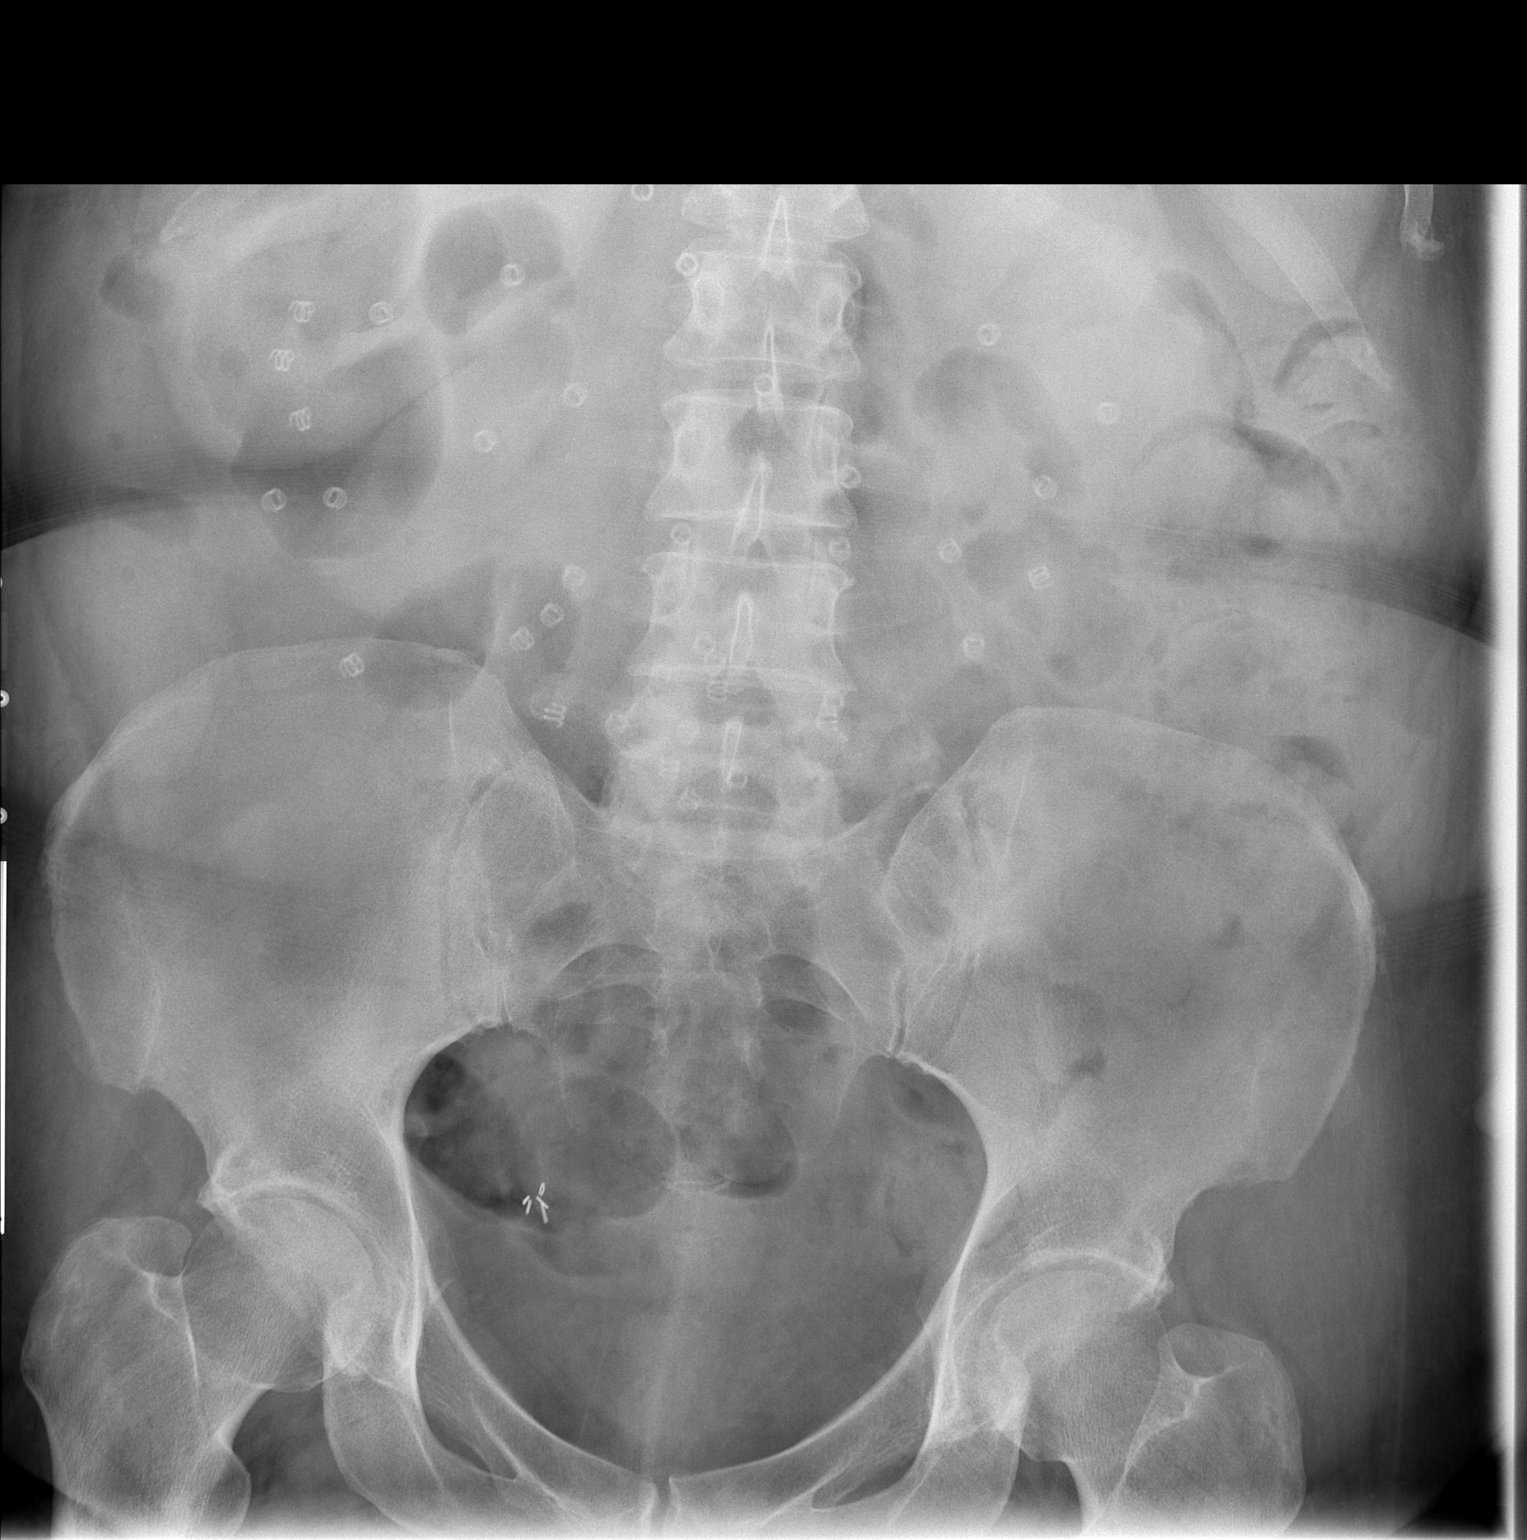

[2 of 2 positions shown; findings below may reference images not displayed]

FINDINGS: Hernia mesh noted.  Clips from prior cholecystectomy and
Nissen fundoplication noted.

There is borderline prominence of stool in the left colon.  No
dilated small bowel identified.

No significant abnormal air-fluid levels.
IMPRESSION: 1.  Borderline prominence stool in the left colon.  Otherwise
normal bowel gas pattern.
2.  Hernia mesh noted.

## 2012-04-12 ENCOUNTER — Other Ambulatory Visit: Payer: Self-pay | Admitting: Critical Care Medicine

## 2012-05-13 ENCOUNTER — Other Ambulatory Visit: Payer: Self-pay | Admitting: Internal Medicine

## 2012-05-15 ENCOUNTER — Other Ambulatory Visit: Payer: Self-pay

## 2012-05-15 MED ORDER — FUROSEMIDE 20 MG PO TABS: 20.0000 mg | ORAL_TABLET | Freq: Two times a day (BID) | ORAL | Status: DC

## 2012-05-15 MED ORDER — BUPROPION HCL ER (XL) 300 MG PO TB24: 300.0000 mg | ORAL_TABLET | Freq: Every day | ORAL | Status: DC

## 2012-05-15 NOTE — Telephone Encounter (Signed)
Patient request Furosemide 20 mg #30 and Bupropion 300 mg 24HR #30. Ok to refill?

## 2012-05-15 NOTE — Telephone Encounter (Signed)
Rx called in to [facility] as directed.

## 2012-05-19 ENCOUNTER — Encounter: Payer: Self-pay | Admitting: Family Medicine

## 2012-05-19 ENCOUNTER — Ambulatory Visit (INDEPENDENT_AMBULATORY_CARE_PROVIDER_SITE_OTHER): Payer: BC Managed Care – PPO | Admitting: Family Medicine

## 2012-05-19 VITALS — BP 122/80 | Temp 97.9°F | Wt 263.0 lb

## 2012-05-19 DIAGNOSIS — J069 Acute upper respiratory infection, unspecified: Secondary | ICD-10-CM

## 2012-05-19 DIAGNOSIS — J329 Chronic sinusitis, unspecified: Secondary | ICD-10-CM

## 2012-05-19 MED ORDER — AZITHROMYCIN 250 MG PO TABS: ORAL_TABLET | ORAL | Status: AC

## 2012-05-19 MED ORDER — METHYLPREDNISOLONE ACETATE 80 MG/ML IJ SUSP
80.0000 mg | Freq: Once | INTRAMUSCULAR | Status: AC
  Administered 2012-05-19: 80 mg via INTRAMUSCULAR

## 2012-05-19 MED ORDER — BENZONATATE 100 MG PO CAPS: 100.0000 mg | ORAL_CAPSULE | Freq: Four times a day (QID) | ORAL | Status: DC | PRN

## 2012-05-19 NOTE — Progress Notes (Signed)
  Subjective:    Patient ID: [name], female    DOB: 1961/04/18, 51 y.o.   MRN: [id number]  HPI Here for one week of stuffy head, PND, ST, HA, and a dry cough.    Review of Systems  HENT: Positive for congestion, postnasal drip and sinus pressure.   Eyes: Negative.   Respiratory: Positive for cough.        Objective:   Physical Exam  Constitutional: She appears well-developed and well-nourished.  HENT:  Right Ear: External ear normal.  Left Ear: External ear normal.  Nose: Nose normal.  Mouth/Throat: Oropharynx is clear and moist.  Eyes: Conjunctivae are normal.  Neck: No thyromegaly present.  Pulmonary/Chest: Effort normal and breath sounds normal.  Lymphadenopathy:    She has no cervical adenopathy.          Assessment & Plan:  Recheck prn. Add Mucinex

## 2012-05-22 ENCOUNTER — Encounter: Payer: Self-pay | Admitting: Family Medicine

## 2012-05-25 ENCOUNTER — Encounter (HOSPITAL_COMMUNITY): Payer: Self-pay | Admitting: Emergency Medicine

## 2012-05-25 DIAGNOSIS — R062 Wheezing: Secondary | ICD-10-CM | POA: Diagnosis not present

## 2012-05-25 DIAGNOSIS — Z79899 Other long term (current) drug therapy: Secondary | ICD-10-CM | POA: Insufficient documentation

## 2012-05-25 DIAGNOSIS — R0602 Shortness of breath: Secondary | ICD-10-CM | POA: Diagnosis not present

## 2012-05-25 DIAGNOSIS — Z7982 Long term (current) use of aspirin: Secondary | ICD-10-CM | POA: Insufficient documentation

## 2012-05-25 DIAGNOSIS — K219 Gastro-esophageal reflux disease without esophagitis: Secondary | ICD-10-CM | POA: Insufficient documentation

## 2012-05-25 DIAGNOSIS — Z888 Allergy status to other drugs, medicaments and biological substances status: Secondary | ICD-10-CM | POA: Insufficient documentation

## 2012-05-25 DIAGNOSIS — E119 Type 2 diabetes mellitus without complications: Secondary | ICD-10-CM | POA: Insufficient documentation

## 2012-05-25 DIAGNOSIS — E785 Hyperlipidemia, unspecified: Secondary | ICD-10-CM | POA: Insufficient documentation

## 2012-05-25 DIAGNOSIS — I10 Essential (primary) hypertension: Secondary | ICD-10-CM | POA: Insufficient documentation

## 2012-05-25 MED ORDER — ALBUTEROL SULFATE (5 MG/ML) 0.5% IN NEBU
2.5000 mg | INHALATION_SOLUTION | Freq: Once | RESPIRATORY_TRACT | Status: AC
  Administered 2012-05-25: 2.5 mg via RESPIRATORY_TRACT

## 2012-05-25 MED ORDER — ALBUTEROL SULFATE (5 MG/ML) 0.5% IN NEBU
2.5000 mg | INHALATION_SOLUTION | Freq: Once | RESPIRATORY_TRACT | Status: AC
  Administered 2012-05-25: 2.5 mg via RESPIRATORY_TRACT
  Filled 2012-05-25: qty 1

## 2012-05-25 NOTE — ED Notes (Signed)
PT. RECEIVING ALBUTEROL 5 MG NEBULIZER TREATMENT AT TRIAGE 3.

## 2012-05-25 NOTE — ED Notes (Signed)
PT. REPORTS WHEEZING WITH DRY COUGH AND SOB ONSET THIS EVENING , RECENTLY DIAGNOSED WITH BRONCHITIS TREATED WITH Z-PACK .

## 2012-05-26 ENCOUNTER — Encounter: Payer: Self-pay | Admitting: Family

## 2012-05-26 ENCOUNTER — Emergency Department (HOSPITAL_COMMUNITY): Payer: BC Managed Care – PPO

## 2012-05-26 ENCOUNTER — Emergency Department (HOSPITAL_COMMUNITY)
Admit: 2012-05-26 | Discharge: 2012-05-26 | Disposition: A | Discharge status: Home / Self Care | Payer: BC Managed Care – PPO

## 2012-05-26 ENCOUNTER — Emergency Department (HOSPITAL_COMMUNITY)
Admission: EM | Admit: 2012-05-26 | Discharge: 2012-05-26 | Disposition: A | Discharge status: Home / Self Care | Payer: BC Managed Care – PPO | Attending: Emergency Medicine | Admitting: Emergency Medicine

## 2012-05-26 ENCOUNTER — Ambulatory Visit (INDEPENDENT_AMBULATORY_CARE_PROVIDER_SITE_OTHER): Payer: BC Managed Care – PPO | Admitting: Family

## 2012-05-26 VITALS — BP 136/74 | HR 88 | Temp 98.0°F | Wt 258.0 lb

## 2012-05-26 DIAGNOSIS — R05 Cough: Secondary | ICD-10-CM | POA: Diagnosis not present

## 2012-05-26 DIAGNOSIS — J209 Acute bronchitis, unspecified: Secondary | ICD-10-CM

## 2012-05-26 DIAGNOSIS — R062 Wheezing: Secondary | ICD-10-CM

## 2012-05-26 DIAGNOSIS — R059 Cough, unspecified: Secondary | ICD-10-CM

## 2012-05-26 DIAGNOSIS — R350 Frequency of micturition: Secondary | ICD-10-CM | POA: Diagnosis not present

## 2012-05-26 LAB — POCT URINALYSIS DIPSTICK
Bilirubin, UA: NEGATIVE
Blood, UA: NEGATIVE
Ketones, UA: NEGATIVE
Leukocytes, UA: NEGATIVE
Nitrite, UA: NEGATIVE
Protein, UA: NEGATIVE
Spec Grav, UA: 1.02
Urobilinogen, UA: 1
pH, UA: 6

## 2012-05-26 MED ORDER — ALBUTEROL SULFATE (5 MG/ML) 0.5% IN NEBU: 2.5000 mg | INHALATION_SOLUTION | Freq: Once | RESPIRATORY_TRACT | Status: DC

## 2012-05-26 MED ORDER — LORAZEPAM 2 MG/ML IJ SOLN
1.0000 mg | Freq: Once | INTRAMUSCULAR | Status: AC
  Administered 2012-05-26: 1 mg via INTRAVENOUS
  Filled 2012-05-26: qty 1

## 2012-05-26 MED ORDER — SODIUM CHLORIDE 0.9 % IV BOLUS (SEPSIS)
1000.0000 mL | Freq: Once | INTRAVENOUS | Status: AC
  Administered 2012-05-26: 1000 mL via INTRAVENOUS

## 2012-05-26 MED ORDER — PREDNISONE 20 MG PO TABS: ORAL_TABLET | ORAL | Status: AC

## 2012-05-26 MED ORDER — DEXAMETHASONE SODIUM PHOSPHATE 10 MG/ML IJ SOLN
10.0000 mg | Freq: Once | INTRAMUSCULAR | Status: AC
  Administered 2012-05-26: 10 mg via INTRAVENOUS
  Filled 2012-05-26: qty 1

## 2012-05-26 NOTE — Progress Notes (Signed)
Subjective:    Patient ID: [name], female    DOB: August 29, 1961, 51 y.o.   MRN: [id number]  HPI 51 year old white female, nonsmoker, patient of Dr. Lovell Sheehan is in today with complaints of cough, wheezing, congestion x2 weeks. She saw Dr. Clent Ridges last week and was treated with a Z-Pak and a shot of Depo-Medrol 80 mg x1. She went to the emergency department last night, was given a nebulizer treatments and medications for IV. Chest x-ray was normal. However she is no better. Denies any fever, muscle aches or pain, no lightheadedness or dizziness    Review of Systems  Constitutional: Negative.   HENT: Negative.   Respiratory: Positive for shortness of breath and wheezing.   Cardiovascular: Negative.   Musculoskeletal: Negative.   Skin: Negative.   Neurological: Negative.   Hematological: Negative.   Psychiatric/Behavioral: Negative.    Past Medical History  Diagnosis Date  . Sarcoidosis 04/10/2007  . DIABETES MELLITUS, TYPE II 08/21/2006  . HYPERLIPIDEMIA 08/21/2006  . GOUT 08/20/2010  . OBESITY 06/04/2009  . ANEMIA-UNSPECIFIED 09/18/2009  . HYPERTENSION 08/21/2006  . GERD 08/21/2006  . Shortness of breath   . SLEEP APNEA 04/21/2009    last testing was negative    History   Social History  . Marital Status: Married    Spouse Name: N/A    Number of Children: N/A  . Years of Education: N/A   Occupational History  . DISABLED    Social History Main Topics  . Smoking status: Never Smoker   . Smokeless tobacco: Never Used  . Alcohol Use: No  . Drug Use: No  . Sexually Active: Yes   Other Topics Concern  . Not on file   Social History Narrative  . No narrative on file    Past Surgical History  Procedure Date  . Ventral hernia repair   . Nissen fundoplication   . Cholecystectomy   . Abdominal hysterectomy   . Knee arthroscopy     right  . Tubal ligation   . Bladder suspension 11/11/2011    Procedure: TRANSVAGINAL TAPE (TVT) PROCEDURE;  Surgeon: Levi Aland,  MD;  Location: [facility];  Service: Gynecology;  Laterality: N/A;  . Cystoscopy 11/11/2011    Procedure: CYSTOSCOPY;  Surgeon: Levi Aland, MD;  Location: [facility];  Service: Gynecology;  Laterality: N/A;    Family History  Problem Relation Age of Onset  . Diabetes Father   . Heart attack Father   . Coronary artery disease Father   . Heart failure Father   . COPD Mother   . Emphysema Mother   . Asthma Mother   . Heart failure Mother   . Sarcoidosis Maternal Uncle   . Colon cancer Neg Hx   . Lung cancer Brother   . Cancer Brother     Allergies  Allergen Reactions  . Lisinopril Cough  . Methotrexate     REACTION: peri-oral and buccal lesions.    Current Outpatient Prescriptions on File Prior to Visit  Medication Sig Dispense Refill  . adalimumab (HUMIRA) 40 MG/0.8ML injection Inject 40 mg into the skin every 14 (fourteen) days. Inject every other Friday.      [name] albuterol (PROVENTIL HFA;VENTOLIN HFA) 108 (90 BASE) MCG/ACT inhaler Inhale 2 puffs into the lungs every 6 (six) hours as needed. For wheezing.      [name] albuterol (PROVENTIL) (5 MG/ML) 0.5% nebulizer solution Take 2.5 mg by nebulization every 6 (six) hours as needed. For wheezing.      [name]  aspirin EC 81 MG tablet Take 81 mg by mouth daily.      [name] atenolol (TENORMIN) 50 MG tablet Take 50 mg by mouth daily.      . benzonatate (TESSALON) 100 MG capsule Take 1 capsule (100 mg total) by mouth every 6 (six) hours as needed for cough.  60 capsule  0  . buPROPion (WELLBUTRIN XL) 300 MG 24 hr tablet Take 1 tablet (300 mg total) by mouth daily.  30 tablet  4  . Chlorpheniramine Tannate 12 MG TABS Take 1 tablet (12 mg total) by mouth at bedtime.  30 each  6  . chlorpheniramine-HYDROcodone (TUSSIONEX) 10-8 MG/5ML LQCR Take 10 mLs by mouth every 12 (twelve) hours as needed. For cough.  140 mL  0  . cholecalciferol (VITAMIN D) 1000 UNITS tablet Take 1,000 Units by mouth 2 (two) times daily.      . ciclesonide (OMNARIS) 50 MCG/ACT nasal spray  Place 1 spray into both nostrils daily.       . cloNIDine (CATAPRES) 0.1 MG tablet Take 0.1 mg by mouth daily.       [name] DEXILANT 60 MG capsule TAKE ONE CAPSULE BY MOUTH ONE TIME DAILY  30 capsule  6  . dexlansoprazole (DEXILANT) 60 MG capsule Take 60 mg by mouth daily.      [name] estradiol (ESTRACE) 2 MG tablet Take 2 mg by mouth daily.      . fenofibrate (TRICOR) 145 MG tablet Take 145 mg by mouth daily.        . furosemide (LASIX) 20 MG tablet Take 1 tablet (20 mg total) by mouth 2 (two) times daily.  30 tablet  4  . glyBURIDE (DIABETA) 2.5 MG tablet Take 1 tablet (2.5 mg total) by mouth daily with breakfast.  30 tablet  5  . LORazepam (ATIVAN) 1 MG tablet Take 1 mg by mouth every 8 (eight) hours as needed. For anxiety.      [name] losartan (COZAAR) 50 MG tablet Take 50 mg by mouth at bedtime.      . metFORMIN (GLUCOPHAGE) 1000 MG tablet Take 1 tablet (1,000 mg total) by mouth 2 (two) times daily with a meal.  180 tablet  3  . nitroGLYCERIN (NITROSTAT) 0.4 MG SL tablet Place 0.4 mg under the tongue every 5 (five) minutes as needed. For chest pain.      . potassium chloride SA (K-DUR,KLOR-CON) 20 MEQ tablet Take 20 mEq by mouth 3 (three) times daily.      . predniSONE (DELTASONE) 10 MG tablet Take 10 mg by mouth daily.      [name] venlafaxine XR (EFFEXOR-XR) 75 MG 24 hr capsule Take 75 mg by mouth 2 (two) times daily.      [name] DISCONTD: allopurinol (ZYLOPRIM) 100 MG tablet Take 100 mg by mouth daily.         Current Facility-Administered Medications on File Prior to Visit  Medication Dose Route Frequency Provider Last Rate Last Dose  . albuterol (PROVENTIL) (5 MG/ML) 0.5% nebulizer solution 2.5 mg  2.5 mg Nebulization Once Suzi Roots, MD   2.5 mg at 05/25/12 2309  . albuterol (PROVENTIL) (5 MG/ML) 0.5% nebulizer solution 2.5 mg  2.5 mg Nebulization Once Suzi Roots, MD   2.5 mg at 05/25/12 2311  . dexamethasone (DECADRON) injection 10 mg  10 mg Intravenous Once Grant Fontana, PA-C   10 mg at  05/26/12 0051  . LORazepam (ATIVAN) injection 1 mg  1 mg Intravenous Once Grant Fontana, PA-C  1 mg at 05/26/12 0052  . sodium chloride 0.9 % bolus 1,000 mL  1,000 mL Intravenous Once Grant Fontana, PA-C   1,000 mL at 05/26/12 0051    BP 136/74  Pulse 88  Temp 98 F (36.7 C) (Oral)  Wt 258 lb (117.028 kg)  SpO2 97%chart    Objective:   Physical Exam  Constitutional: She is oriented to person, place, and time. She appears well-developed and well-nourished.  HENT:  Right Ear: External ear normal.  Left Ear: External ear normal.  Nose: Nose normal.  Mouth/Throat: Oropharynx is clear and moist.  Neck: Normal range of motion. Neck supple.  Cardiovascular: Normal rate, regular rhythm and normal heart sounds.   Pulmonary/Chest: Effort normal. She has wheezes.  Neurological: She is alert and oriented to person, place, and time.  Skin: Skin is warm and dry.  Psychiatric: She has a normal mood and affect.        UA is normal Assessment & Plan:  Assessment: Acute bronchitis, cough, dysuria  Plan: Prednisone 20mg  tab 60mg  x 3 days, 40mg  x 3 days, 20mg  x 3days. Symbicort 160/4.5 2 puffs twice a day. Call the office if symptoms worsen or persist.

## 2012-05-26 NOTE — Patient Instructions (Addendum)
Bronchitis Bronchitis is the body's way of reacting to injury and/or infection (inflammation) of the bronchi. Bronchi are the air tubes that extend from the windpipe into the lungs. If the inflammation becomes severe, it may cause shortness of breath. CAUSES  Inflammation may be caused by:  A virus.   Germs (bacteria).   Dust.   Allergens.   Pollutants and many other irritants.  The cells lining the bronchial tree are covered with tiny hairs (cilia). These constantly beat upward, away from the lungs, toward the mouth. This keeps the lungs free of pollutants. When these cells become too irritated and are unable to do their job, mucus begins to develop. This causes the characteristic cough of bronchitis. The cough clears the lungs when the cilia are unable to do their job. Without either of these protective mechanisms, the mucus would settle in the lungs. Then you would develop pneumonia. Smoking is a common cause of bronchitis and can contribute to pneumonia. Stopping this habit is the single most important thing you can do to help yourself. TREATMENT   Your caregiver may prescribe an antibiotic if the cough is caused by bacteria. Also, medicines that open up your airways make it easier to breathe. Your caregiver may also recommend or prescribe an expectorant. It will loosen the mucus to be coughed up. Only take over-the-counter or prescription medicines for pain, discomfort, or fever as directed by your caregiver.   Removing whatever causes the problem (smoking, for example) is critical to preventing the problem from getting worse.   Cough suppressants may be prescribed for relief of cough symptoms.   Inhaled medicines may be prescribed to help with symptoms now and to help prevent problems from returning.   For those with recurrent (chronic) bronchitis, there may be a need for steroid medicines.  SEEK IMMEDIATE MEDICAL CARE IF:   During treatment, you develop more pus-like mucus  (purulent sputum).   You have a fever.   Your baby is older than 3 months with a rectal temperature of 102 F (38.9 C) or higher.   Your baby is 3 months old or younger with a rectal temperature of 100.4 F (38 C) or higher.   You become progressively more ill.   You have increased difficulty breathing, wheezing, or shortness of breath.  It is necessary to seek immediate medical care if you are elderly or sick from any other disease. MAKE SURE YOU:   Understand these instructions.   Will watch your condition.   Will get help right away if you are not doing well or get worse.  Document Released: 09/27/2005 Document Revised: 09/16/2011 Document Reviewed: 08/06/2008 [employer] Patient Information 2012 [employer]. 

## 2012-05-26 NOTE — Addendum Note (Signed)
Addended by: Raj Janus on: 05/26/2012 04:00 PM   Modules accepted: Orders

## 2012-05-26 NOTE — ED Notes (Signed)
Patient ambulated appx 60 feet on RA around SPO2 93-97%, HR 119-130. Pt tolerated ok. Pt continues to wheeze and sob. NAD. Pt now resting comfortably in bed.

## 2012-05-26 NOTE — ED Notes (Signed)
Patient transported to CT 

## 2012-05-29 NOTE — ED Provider Notes (Signed)
History     CSN: [id number]  Arrival date & time 05/25/12  2301   First MD Initiated Contact with Patient 05/26/12 0019      Chief Complaint  Patient presents with  . Wheezing    (Consider location/radiation/quality/duration/timing/severity/associated sxs/prior treatment) Patient is a 51 y.o. female presenting with wheezing.  Wheezing  Associated symptoms include wheezing.   Hx from pt. [name] is a 51 y.o. female with hx sarcoidosis who presents with c/o wheezing. States she was seen last week at her PCP's office for a nonproductive cough and shortness of breath; she was diagnosed with bronchitis and treated with a z-pack. She was told that she was wheezing while she was there and was given neb tx x 1 and shot of solumedrol. She is chronically on 10 mg of prednisone for her sarcoidosis so was not sent home with steroids. Pt reports that this evening she had return of her dry cough and felt as if she was wheezing again and short of breath. No associated chest pain, nausea, vomiting, diaphoresis, palpitations, fever, chills. Patient has no prior history of DVT/PE. Denies recent trauma, surgery, or prolonged immobilization. Denies hemoptysis, leg swelling, or use of exogenous estrogen.  Pt does have a pulmonologist, Dr. Delford Field.  Was given albuterol neb while in ED triage.  Past Medical History  Diagnosis Date  . Sarcoidosis 04/10/2007  . DIABETES MELLITUS, TYPE II 08/21/2006  . HYPERLIPIDEMIA 08/21/2006  . GOUT 08/20/2010  . OBESITY 06/04/2009  . ANEMIA-UNSPECIFIED 09/18/2009  . HYPERTENSION 08/21/2006  . GERD 08/21/2006  . Shortness of breath   . SLEEP APNEA 04/21/2009    last testing was negative    Past Surgical History  Procedure Date  . Ventral hernia repair   . Nissen fundoplication   . Cholecystectomy   . Abdominal hysterectomy   . Knee arthroscopy     right  . Tubal ligation   . Bladder suspension 11/11/2011    Procedure: TRANSVAGINAL TAPE (TVT) PROCEDURE;   Surgeon: Levi Aland, MD;  Location: [facility];  Service: Gynecology;  Laterality: N/A;  . Cystoscopy 11/11/2011    Procedure: CYSTOSCOPY;  Surgeon: Levi Aland, MD;  Location: [facility];  Service: Gynecology;  Laterality: N/A;    Family History  Problem Relation Age of Onset  . Diabetes Father   . Heart attack Father   . Coronary artery disease Father   . Heart failure Father   . COPD Mother   . Emphysema Mother   . Asthma Mother   . Heart failure Mother   . Sarcoidosis Maternal Uncle   . Colon cancer Neg Hx   . Lung cancer Brother   . Cancer Brother     History  Substance Use Topics  . Smoking status: Never Smoker   . Smokeless tobacco: Never Used  . Alcohol Use: No    OB History    Grav Para Term Preterm Abortions TAB SAB Ect Mult Living                  Review of Systems  Respiratory: Positive for wheezing.   All other systems reviewed and are negative.    Allergies  Lisinopril and Methotrexate  Home Medications   Current Outpatient Rx  Name Route Sig Dispense Refill  . ADALIMUMAB 40 MG/0.8ML [place] KIT Subcutaneous Inject 40 mg into the skin every 14 (fourteen) days. Inject every other Friday.    . ALBUTEROL SULFATE HFA 108 (90 BASE) MCG/ACT IN AERS Inhalation Inhale  2 puffs into the lungs every 6 (six) hours as needed. For wheezing.    . ALBUTEROL SULFATE (5 MG/ML) 0.5% IN NEBU Nebulization Take 2.5 mg by nebulization every 6 (six) hours as needed. For wheezing.    . ASPIRIN EC 81 MG PO TBEC Oral Take 81 mg by mouth daily.    . ATENOLOL 50 MG PO TABS Oral Take 50 mg by mouth daily.    [name] BENZONATATE 100 MG PO CAPS Oral Take 1 capsule (100 mg total) by mouth every 6 (six) hours as needed for cough. 60 capsule 0  . BUPROPION HCL ER (XL) 300 MG PO TB24 Oral Take 1 tablet (300 mg total) by mouth daily. 30 tablet 4  . CHLORPHENIRAMINE TANNATE 12 MG PO TABS Oral Take 1 tablet (12 mg total) by mouth at bedtime. 30 each 6  . HYDROCOD POLST-CPM POLST ER 10-8 MG/5ML PO  LQCR Oral Take 10 mLs by mouth every 12 (twelve) hours as needed. For cough. 140 mL 0  . VITAMIN D 1000 UNITS PO TABS Oral Take 1,000 Units by mouth 2 (two) times daily.    [name] CICLESONIDE 50 MCG/ACT NA SUSP Each Nare Place 1 spray into both nostrils daily.     [name] CLONIDINE HCL 0.1 MG PO TABS Oral Take 0.1 mg by mouth daily.     [name] DEXILANT 60 MG PO CPDR  TAKE ONE CAPSULE BY MOUTH ONE TIME DAILY 30 capsule 6  . DEXLANSOPRAZOLE 60 MG PO CPDR Oral Take 60 mg by mouth daily.    [name] ESTRADIOL 2 MG PO TABS Oral Take 2 mg by mouth daily.    . FENOFIBRATE 145 MG PO TABS Oral Take 145 mg by mouth daily.      . FUROSEMIDE 20 MG PO TABS Oral Take 1 tablet (20 mg total) by mouth 2 (two) times daily. 30 tablet 4  . GLYBURIDE 2.5 MG PO TABS Oral Take 1 tablet (2.5 mg total) by mouth daily with breakfast. 30 tablet 5  . LORAZEPAM 1 MG PO TABS Oral Take 1 mg by mouth every 8 (eight) hours as needed. For anxiety.    [name] LOSARTAN POTASSIUM 50 MG PO TABS Oral Take 50 mg by mouth at bedtime.    [name] METFORMIN HCL 1000 MG PO TABS Oral Take 1 tablet (1,000 mg total) by mouth 2 (two) times daily with a meal. 180 tablet 3  . NITROGLYCERIN 0.4 MG SL SUBL Sublingual Place 0.4 mg under the tongue every 5 (five) minutes as needed. For chest pain.    [name] POTASSIUM CHLORIDE CRYS ER 20 MEQ PO TBCR Oral Take 20 mEq by mouth 3 (three) times daily.    [name] PREDNISONE 10 MG PO TABS Oral Take 10 mg by mouth daily.    . VENLAFAXINE HCL ER 75 MG PO CP24 Oral Take 75 mg by mouth 2 (two) times daily.    [name] PREDNISONE 20 MG PO TABS  60mg  PO qam x 3 days, 40mg  po qam x 3 days, 20mg  qam x 3 days 18 tablet 0    BP 134/85  Pulse 76  Temp 97.3 F (36.3 C) (Oral)  Resp 18  SpO2 97%  Physical Exam  Nursing note and vitals reviewed. Constitutional: She appears well-developed and well-nourished. No distress.       obese  HENT:  Head: Normocephalic and atraumatic.  Eyes:       Normal appearance  Neck: Normal range of motion. Neck supple.    Cardiovascular: Normal rate,  regular rhythm and normal heart sounds.   Pulmonary/Chest: Effort normal and breath sounds normal.       Pt noted to have audible upper airway "wheeze." No stridor noted. Lungs are clear to auscultation bilaterally without adventitious breath sounds. No respiratory distress.  Abdominal: Soft. Bowel sounds are normal. There is no tenderness. There is no rebound and no guarding.  Musculoskeletal: Normal range of motion.  Neurological: She is alert.  Skin: Skin is warm and dry. She is not diaphoretic.  Psychiatric: She has a normal mood and affect.    ED Course  Procedures (including critical care time)  Labs Reviewed - No data to display No results found. DG Neck Soft Tissue (Final result)   Result time:05/26/12 0251    Final result by Rad Results In Interface (05/26/12 02:51:44)    Narrative:   *RADIOLOGY REPORT*  Clinical Data: Short of breath. Wheezing.  NECK SOFT TISSUES - 1+ VIEW  Comparison: None.  Findings: Epiglottis and aryepiglottic folds grossly appear within normal limits. Visualized prevertebral soft tissues normal. No radiopaque foreign body. The lower cervical soft tissues are obscured by artifact and overlapping soft tissue from the shoulders. The patient is edentulous. Hypopharynx appears within normal limits.  IMPRESSION: No acute abnormality. Normal appearance of the epiglottis. Poor visualization of the lower soft tissues of the neck.  Original Report Authenticated By: Andreas Newport, M.D.            DG Chest 2 View (Final result)   Result time:05/26/12 0021    Final result by Rad Results In Interface (05/26/12 00:21:03)    Narrative:   *RADIOLOGY REPORT*  Clinical Data: Wheezing, shortness of breath.  CHEST - 2 VIEW  Comparison: 12/30/2011  Findings: Vascular clips near the GE junction. Heart size upper limits normal. Lungs are clear. No effusion. Regional bones unremarkable.  IMPRESSION:  No acute  disease  Original Report Authenticated By: Thora Lance III, M.D.      1. Wheezing       MDM  Pt with reported wheezing and cough. On exam, this seems to be from the upper airway. No overt wheezes heard on auscultation of lung fields. Pt's imaging studies negative. Pt instructed to continue her home medications for cough and congestion, continue home dose of prednisone, and follow up with her PCP. Reasons to return to ED discussed.       Grant Fontana, PA-C 05/29/12 2301

## 2012-05-30 NOTE — ED Provider Notes (Signed)
Medical screening examination/treatment/procedure(s) were conducted as a shared visit with non-physician practitioner(s) and myself.  I personally evaluated the patient during the encounter Pt w hx sarcoid, notes ?wheezing/sob earlier. Now breathing at baseline. No cp no fever. No prod cough. Chest cta. Recent notes reviewed ?gerd as well.   Suzi Roots, MD 05/30/12 1114

## 2012-06-06 ENCOUNTER — Other Ambulatory Visit: Payer: Self-pay | Admitting: Internal Medicine

## 2012-06-14 ENCOUNTER — Telehealth: Payer: Self-pay | Admitting: Family Medicine

## 2012-06-14 NOTE — Telephone Encounter (Signed)
Refill request for Tussionex 10-8mg /46ml

## 2012-06-15 NOTE — Telephone Encounter (Signed)
I left a voice message with below information. 

## 2012-06-15 NOTE — Telephone Encounter (Signed)
No refills, she needs to follow up with Dr. Cato Mulligan

## 2012-06-20 ENCOUNTER — Telehealth: Payer: Self-pay | Admitting: Critical Care Medicine

## 2012-06-20 NOTE — Telephone Encounter (Signed)
Called pt x3 and left messages to schd a follow up apt. No return call back. Sent letter 06/13/12. °

## 2012-06-26 ENCOUNTER — Other Ambulatory Visit: Payer: Self-pay

## 2012-06-26 NOTE — Progress Notes (Signed)
Opened in error

## 2012-07-07 ENCOUNTER — Other Ambulatory Visit: Payer: Self-pay | Admitting: Internal Medicine

## 2012-07-11 DIAGNOSIS — D869 Sarcoidosis, unspecified: Secondary | ICD-10-CM | POA: Diagnosis not present

## 2012-07-11 LAB — HM DIABETES EYE EXAM

## 2012-07-14 ENCOUNTER — Encounter: Payer: Self-pay | Admitting: Internal Medicine

## 2012-07-14 ENCOUNTER — Ambulatory Visit (INDEPENDENT_AMBULATORY_CARE_PROVIDER_SITE_OTHER): Payer: BC Managed Care – PPO | Admitting: Internal Medicine

## 2012-07-14 VITALS — BP 160/86 | HR 95 | Temp 98.2°F | Wt 266.0 lb

## 2012-07-14 DIAGNOSIS — J209 Acute bronchitis, unspecified: Secondary | ICD-10-CM | POA: Diagnosis not present

## 2012-07-14 DIAGNOSIS — E119 Type 2 diabetes mellitus without complications: Secondary | ICD-10-CM

## 2012-07-14 MED ORDER — MOMETASONE FURO-FORMOTEROL FUM 200-5 MCG/ACT IN AERO: 2.0000 | INHALATION_SPRAY | Freq: Two times a day (BID) | RESPIRATORY_TRACT | Status: DC

## 2012-07-14 MED ORDER — LEVOFLOXACIN 500 MG PO TABS: 500.0000 mg | ORAL_TABLET | Freq: Every day | ORAL | Status: DC

## 2012-07-14 MED ORDER — PREDNISONE 20 MG PO TABS: ORAL_TABLET | ORAL | Status: DC

## 2012-07-14 NOTE — Assessment & Plan Note (Signed)
Patient reports her blood sugars and I greater than 200 with using prednisone. Patient understands to contact her office if hyperglycemia worsens. She may need short-term use of Lantus.

## 2012-07-14 NOTE — Progress Notes (Signed)
Subjective:    Patient ID: [name] Mohs, female    DOB: September 10, 1961, 51 y.o.   MRN: [id number]  HPI  50 year old white female with history of sarcoidosis, type 2 diabetes and hypertension complains of cough and wheezing. Patient was seen in mid to late August by nurse practitioner and prescribed prednisone. Patient reports her symptoms got better then had recurrence of symptoms last week. She was seen at local urgent care on Sunday. Patient reports she couldn't breathe. She was given steroid injection and started on azithromycin. She has been using her albuterol treatments without any improvement. He she describes dry hacking cough and chest tightness. She denies fever or chills.  Patient is also diabetic. Patient reports her blood sugars haven't been greater than 200 since using prednisone.  Review of Systems Negative for fever or chills  Past Medical History  Diagnosis Date  . Sarcoidosis 04/10/2007  . DIABETES MELLITUS, TYPE II 08/21/2006  . HYPERLIPIDEMIA 08/21/2006  . GOUT 08/20/2010  . OBESITY 06/04/2009  . ANEMIA-UNSPECIFIED 09/18/2009  . HYPERTENSION 08/21/2006  . GERD 08/21/2006  . Shortness of breath   . SLEEP APNEA 04/21/2009    last testing was negative    History   Social History  . Marital Status: Married    Spouse Name: N/A    Number of Children: N/A  . Years of Education: N/A   Occupational History  . DISABLED    Social History Main Topics  . Smoking status: Never Smoker   . Smokeless tobacco: Never Used  . Alcohol Use: No  . Drug Use: No  . Sexually Active: Yes   Other Topics Concern  . Not on file   Social History Narrative  . No narrative on file    Past Surgical History  Procedure Date  . Ventral hernia repair   . Nissen fundoplication   . Cholecystectomy   . Abdominal hysterectomy   . Knee arthroscopy     right  . Tubal ligation   . Bladder suspension 11/11/2011    Procedure: TRANSVAGINAL TAPE (TVT) PROCEDURE;  Surgeon: Levi Aland, MD;  Location: [facility];  Service: Gynecology;  Laterality: N/A;  . Cystoscopy 11/11/2011    Procedure: CYSTOSCOPY;  Surgeon: Levi Aland, MD;  Location: [facility];  Service: Gynecology;  Laterality: N/A;    Family History  Problem Relation Age of Onset  . Diabetes Father   . Heart attack Father   . Coronary artery disease Father   . Heart failure Father   . COPD Mother   . Emphysema Mother   . Asthma Mother   . Heart failure Mother   . Sarcoidosis Maternal Uncle   . Colon cancer Neg Hx   . Lung cancer Brother   . Cancer Brother     Allergies  Allergen Reactions  . Lisinopril Cough  . Methotrexate     REACTION: peri-oral and buccal lesions.    Current Outpatient Prescriptions on File Prior to Visit  Medication Sig Dispense Refill  . adalimumab (HUMIRA) 40 MG/0.8ML injection Inject 40 mg into the skin every 14 (fourteen) days. Inject every other Friday.      [name] albuterol (PROVENTIL HFA;VENTOLIN HFA) 108 (90 BASE) MCG/ACT inhaler Inhale 2 puffs into the lungs every 6 (six) hours as needed. For wheezing.      [name] albuterol (PROVENTIL) (5 MG/ML) 0.5% nebulizer solution Take 2.5 mg by nebulization every 6 (six) hours as needed. For wheezing.      [name] aspirin EC 81 MG  tablet Take 81 mg by mouth daily.      [name] atenolol (TENORMIN) 50 MG tablet Take 50 mg by mouth daily.      [name] atorvastatin (LIPITOR) 40 MG tablet TAKE ONE TABLET BY MOUTH ONE TIME DAILY  90 tablet  0  . benzonatate (TESSALON) 100 MG capsule Take 1 capsule (100 mg total) by mouth every 6 (six) hours as needed for cough.  60 capsule  0  . buPROPion (WELLBUTRIN XL) 300 MG 24 hr tablet Take 1 tablet (300 mg total) by mouth daily.  30 tablet  4  . Chlorpheniramine Tannate 12 MG TABS Take 1 tablet (12 mg total) by mouth at bedtime.  30 each  6  . chlorpheniramine-HYDROcodone (TUSSIONEX) 10-8 MG/5ML LQCR Take 10 mLs by mouth every 12 (twelve) hours as needed. For cough.  140 mL  0  . cholecalciferol (VITAMIN D) 1000 UNITS  tablet Take 1,000 Units by mouth 2 (two) times daily.      . ciclesonide (OMNARIS) 50 MCG/ACT nasal spray Place 1 spray into both nostrils daily.       . cloNIDine (CATAPRES) 0.1 MG tablet Take 0.1 mg by mouth daily.       [name] DEXILANT 60 MG capsule TAKE ONE CAPSULE BY MOUTH ONE TIME DAILY  30 capsule  6  . dexlansoprazole (DEXILANT) 60 MG capsule Take 60 mg by mouth daily.      [name] estradiol (ESTRACE) 2 MG tablet Take 2 mg by mouth daily.      . fenofibrate (TRICOR) 145 MG tablet Take 145 mg by mouth daily.        . furosemide (LASIX) 20 MG tablet Take 1 tablet (20 mg total) by mouth 2 (two) times daily.  30 tablet  4  . glyBURIDE (DIABETA) 2.5 MG tablet Take 1 tablet (2.5 mg total) by mouth daily with breakfast.  30 tablet  5  . LORazepam (ATIVAN) 1 MG tablet Take 1 mg by mouth every 8 (eight) hours as needed. For anxiety.      [name] losartan (COZAAR) 50 MG tablet Take 50 mg by mouth at bedtime.      . metFORMIN (GLUCOPHAGE) 1000 MG tablet Take 1 tablet (1,000 mg total) by mouth 2 (two) times daily with a meal.  180 tablet  3  . Mometasone Furo-Formoterol Fum (DULERA) 200-5 MCG/ACT AERO Inhale 2 puffs into the lungs 2 (two) times daily.  8.8 g  1  . nitroGLYCERIN (NITROSTAT) 0.4 MG SL tablet Place 0.4 mg under the tongue every 5 (five) minutes as needed. For chest pain.      . potassium chloride SA (K-DUR,KLOR-CON) 20 MEQ tablet Take 20 mEq by mouth 3 (three) times daily.      . potassium chloride SA (K-DUR,KLOR-CON) 20 MEQ tablet TAKE ONE TABLET BY MOUTH THREE TIMES DAILY  90 tablet  3  . predniSONE (DELTASONE) 10 MG tablet Take 10 mg by mouth daily.      [name] venlafaxine XR (EFFEXOR-XR) 75 MG 24 hr capsule Take 75 mg by mouth 2 (two) times daily.      [name] DISCONTD: allopurinol (ZYLOPRIM) 100 MG tablet Take 100 mg by mouth daily.         Current Facility-Administered Medications on File Prior to Visit  Medication Dose Route Frequency Provider Last Rate Last Dose  . albuterol (PROVENTIL) (5 MG/ML) 0.5%  nebulizer solution 2.5 mg  2.5 mg Nebulization Once Baker Pierini, FNP        BP 160/86  Pulse 95  Temp 98.2 F (36.8 C) (Oral)  Wt 266 lb (120.657 kg)  SpO2 98%       Objective:   Physical Exam  Constitutional: She is oriented to person, place, and time.       Pleasant, obese, sweaty  HENT:  Head: Normocephalic and atraumatic.  Right Ear: External ear normal.  Left Ear: External ear normal.  Mouth/Throat: Oropharynx is clear and moist.  Cardiovascular: Normal rate, regular rhythm and normal heart sounds.   Pulmonary/Chest: Effort normal.       Decreased breath sounds throughout. Faint high-pitched expiratory wheezing  Musculoskeletal: She exhibits no edema.  Neurological: She is alert and oriented to person, place, and time.  Skin: Skin is warm and dry.  Psychiatric: She has a normal mood and affect. Her behavior is normal.          Assessment & Plan:

## 2012-07-14 NOTE — Assessment & Plan Note (Signed)
51 year old white female with history of sarcoidosis with acute asthmatic bronchitis flare. She was seen in urgent care on Sunday and started on azithromycin and prednisone. Patient experiencing treatment failure. I suggest we switch to Levaquin 500 milligrams once daily for 10 days. Continue prednisone taper for additional 10 days.  Samples of Dulera provided.  Patient advised to call office if symptoms persist or worsen.  Reassess in 1 week.

## 2012-07-16 ENCOUNTER — Emergency Department (HOSPITAL_COMMUNITY)
Admission: EM | Admit: 2012-07-16 | Discharge: 2012-07-17 | Disposition: A | Discharge status: Home / Self Care | Payer: BC Managed Care – PPO | Attending: Emergency Medicine | Admitting: Emergency Medicine

## 2012-07-16 ENCOUNTER — Encounter (HOSPITAL_COMMUNITY): Payer: Self-pay | Admitting: Emergency Medicine

## 2012-07-16 DIAGNOSIS — R0602 Shortness of breath: Secondary | ICD-10-CM | POA: Diagnosis not present

## 2012-07-16 DIAGNOSIS — Z79899 Other long term (current) drug therapy: Secondary | ICD-10-CM | POA: Insufficient documentation

## 2012-07-16 DIAGNOSIS — D869 Sarcoidosis, unspecified: Secondary | ICD-10-CM | POA: Diagnosis not present

## 2012-07-16 DIAGNOSIS — M109 Gout, unspecified: Secondary | ICD-10-CM | POA: Diagnosis not present

## 2012-07-16 DIAGNOSIS — H538 Other visual disturbances: Secondary | ICD-10-CM | POA: Insufficient documentation

## 2012-07-16 DIAGNOSIS — R42 Dizziness and giddiness: Secondary | ICD-10-CM | POA: Insufficient documentation

## 2012-07-16 DIAGNOSIS — K117 Disturbances of salivary secretion: Secondary | ICD-10-CM | POA: Insufficient documentation

## 2012-07-16 DIAGNOSIS — R631 Polydipsia: Secondary | ICD-10-CM | POA: Insufficient documentation

## 2012-07-16 DIAGNOSIS — IMO0002 Reserved for concepts with insufficient information to code with codable children: Secondary | ICD-10-CM | POA: Insufficient documentation

## 2012-07-16 DIAGNOSIS — T380X5A Adverse effect of glucocorticoids and synthetic analogues, initial encounter: Secondary | ICD-10-CM

## 2012-07-16 DIAGNOSIS — R110 Nausea: Secondary | ICD-10-CM | POA: Diagnosis not present

## 2012-07-16 DIAGNOSIS — J9801 Acute bronchospasm: Secondary | ICD-10-CM | POA: Diagnosis not present

## 2012-07-16 DIAGNOSIS — E669 Obesity, unspecified: Secondary | ICD-10-CM | POA: Diagnosis not present

## 2012-07-16 DIAGNOSIS — I10 Essential (primary) hypertension: Secondary | ICD-10-CM | POA: Insufficient documentation

## 2012-07-16 DIAGNOSIS — Z7982 Long term (current) use of aspirin: Secondary | ICD-10-CM | POA: Insufficient documentation

## 2012-07-16 DIAGNOSIS — K219 Gastro-esophageal reflux disease without esophagitis: Secondary | ICD-10-CM | POA: Insufficient documentation

## 2012-07-16 DIAGNOSIS — E119 Type 2 diabetes mellitus without complications: Secondary | ICD-10-CM | POA: Insufficient documentation

## 2012-07-16 DIAGNOSIS — R739 Hyperglycemia, unspecified: Secondary | ICD-10-CM

## 2012-07-16 LAB — COMPREHENSIVE METABOLIC PANEL
ALT: 17 U/L (ref 0–35)
AST: 16 U/L (ref 0–37)
Albumin: 4 g/dL (ref 3.5–5.2)
Alkaline Phosphatase: 77 U/L (ref 39–117)
BUN: 18 mg/dL (ref 6–23)
CO2: 26 mEq/L (ref 19–32)
Calcium: 10 mg/dL (ref 8.4–10.5)
Chloride: 94 mEq/L — ABNORMAL LOW (ref 96–112)
Creatinine, Ser: 0.61 mg/dL (ref 0.50–1.10)
GFR calc Af Amer: >90 mL/min (ref >90)
GFR calc non Af Amer: >90 mL/min (ref >90)
Glucose, Bld: 311 mg/dL — ABNORMAL HIGH (ref 70–99)
Potassium: 4 mEq/L (ref 3.5–5.1)
Sodium: 135 mEq/L (ref 135–145)
Total Bilirubin: 0.4 mg/dL (ref 0.3–1.2)
Total Protein: 7.3 g/dL (ref 6.0–8.3)

## 2012-07-16 LAB — CBC
HCT: 35.5 % — ABNORMAL LOW (ref 36.0–46.0)
Hemoglobin: 11.9 g/dL — ABNORMAL LOW (ref 12.0–15.0)
MCH: 30.6 pg (ref 26.0–34.0)
MCHC: 33.5 g/dL (ref 30.0–36.0)
MCV: 91.3 fL (ref 78.0–100.0)
Platelets: 409 10*3/uL — ABNORMAL HIGH (ref 150–400)
RBC: 3.89 MIL/uL (ref 3.87–5.11)
RDW: 13.9 % (ref 11.5–15.5)
WBC: 18.4 10*3/uL — ABNORMAL HIGH (ref 4.0–10.5)

## 2012-07-16 LAB — GLUCOSE, CAPILLARY: Glucose-Capillary: 301 mg/dL — ABNORMAL HIGH (ref 70–99)

## 2012-07-16 MED ORDER — SODIUM CHLORIDE 0.9 % IV SOLN
INTRAVENOUS | Status: AC
  Administered 2012-07-17: 1.7 [IU]/h via INTRAVENOUS
  Filled 2012-07-16: qty 1

## 2012-07-16 MED ORDER — IPRATROPIUM BROMIDE 0.02 % IN SOLN
0.5000 mg | Freq: Once | RESPIRATORY_TRACT | Status: AC
  Administered 2012-07-17: 0.5 mg via RESPIRATORY_TRACT
  Filled 2012-07-16: qty 2.5

## 2012-07-16 MED ORDER — SODIUM CHLORIDE 0.9 % IV BOLUS (SEPSIS)
1000.0000 mL | Freq: Once | INTRAVENOUS | Status: AC
  Administered 2012-07-16: 1000 mL via INTRAVENOUS

## 2012-07-16 MED ORDER — ALBUTEROL SULFATE (5 MG/ML) 0.5% IN NEBU
5.0000 mg | INHALATION_SOLUTION | Freq: Once | RESPIRATORY_TRACT | Status: AC
  Administered 2012-07-17: 5 mg via RESPIRATORY_TRACT
  Filled 2012-07-16: qty 40

## 2012-07-16 MED ORDER — ONDANSETRON HCL 4 MG/2ML IJ SOLN
4.0000 mg | Freq: Once | INTRAMUSCULAR | Status: AC
  Administered 2012-07-17: 4 mg via INTRAVENOUS
  Filled 2012-07-16: qty 2

## 2012-07-16 NOTE — ED Provider Notes (Signed)
History     CSN: [id number]  Arrival date & time 07/16/12  2238   First MD Initiated Contact with Patient 07/16/12 2329      Chief Complaint  Patient presents with  . Hyperglycemia    (Consider location/radiation/quality/duration/timing/severity/associated sxs/prior treatment) HPI This is a 51-year-old white female with a history of diabetes as well as sarcoidosis. Due to respiratory difficulty she was recently placed on prednisone 20 mg twice daily. She's been on this for about a week. As a result she has noticed her sugars have been uncontrolled. Her sugar was 343 earlier today. She complains of dry mouth, frequent urination, excessive thirst, blurred vision, nausea, sweating and lightheadedness. She has shortness of breath but this is consistent with her chronic sarcoidosis. She also states that she has had fidgety legs for about the past month. She denies abdominal pain.  Past Medical History  Diagnosis Date  . Sarcoidosis 04/10/2007  . DIABETES MELLITUS, TYPE II 08/21/2006  . HYPERLIPIDEMIA 08/21/2006  . GOUT 08/20/2010  . OBESITY 06/04/2009  . ANEMIA-UNSPECIFIED 09/18/2009  . HYPERTENSION 08/21/2006  . GERD 08/21/2006  . Shortness of breath   . SLEEP APNEA 04/21/2009    last testing was negative    Past Surgical History  Procedure Date  . Ventral hernia repair   . Nissen fundoplication   . Cholecystectomy   . Abdominal hysterectomy   . Knee arthroscopy     right  . Tubal ligation   . Bladder suspension 11/11/2011    Procedure: TRANSVAGINAL TAPE (TVT) PROCEDURE;  Surgeon: Levi Aland, MD;  Location: [facility];  Service: Gynecology;  Laterality: N/A;  . Cystoscopy 11/11/2011    Procedure: CYSTOSCOPY;  Surgeon: Levi Aland, MD;  Location: [facility];  Service: Gynecology;  Laterality: N/A;    Family History  Problem Relation Age of Onset  . Diabetes Father   . Heart attack Father   . Coronary artery disease Father   . Heart failure Father   . COPD Mother   .  Emphysema Mother   . Asthma Mother   . Heart failure Mother   . Sarcoidosis Maternal Uncle   . Colon cancer Neg Hx   . Lung cancer Brother   . Cancer Brother     History  Substance Use Topics  . Smoking status: Never Smoker   . Smokeless tobacco: Never Used  . Alcohol Use: No    OB History    Grav Para Term Preterm Abortions TAB SAB Ect Mult Living                  Review of Systems  All other systems reviewed and are negative.    Allergies  Lisinopril and Methotrexate  Home Medications   Current Outpatient Rx  Name Route Sig Dispense Refill  . ADALIMUMAB 40 MG/0.8ML [place] KIT Subcutaneous Inject 40 mg into the skin every 14 (fourteen) days. Inject every other Friday.    . ALBUTEROL SULFATE HFA 108 (90 BASE) MCG/ACT IN AERS Inhalation Inhale 2 puffs into the lungs every 6 (six) hours as needed. For wheezing.    . ALBUTEROL SULFATE (5 MG/ML) 0.5% IN NEBU Nebulization Take 2.5 mg by nebulization every 6 (six) hours as needed. For wheezing.    . ASPIRIN EC 81 MG PO TBEC Oral Take 81 mg by mouth daily.    . ATENOLOL 50 MG PO TABS Oral Take 50 mg by mouth daily.    . ATORVASTATIN CALCIUM 40 MG PO TABS  TAKE ONE  TABLET BY MOUTH ONE TIME DAILY 90 tablet 0  . BENZONATATE 100 MG PO CAPS Oral Take 1 capsule (100 mg total) by mouth every 6 (six) hours as needed for cough. 60 capsule 0  . BUPROPION HCL ER (XL) 300 MG PO TB24 Oral Take 1 tablet (300 mg total) by mouth daily. 30 tablet 4  . CHLORPHENIRAMINE TANNATE 12 MG PO TABS Oral Take 1 tablet (12 mg total) by mouth at bedtime. 30 each 6  . HYDROCOD POLST-CPM POLST ER 10-8 MG/5ML PO LQCR Oral Take 10 mLs by mouth every 12 (twelve) hours as needed. For cough. 140 mL 0  . VITAMIN D 1000 UNITS PO TABS Oral Take 1,000 Units by mouth 2 (two) times daily.    [name] CICLESONIDE 50 MCG/ACT NA SUSP Each Nare Place 1 spray into both nostrils daily.     [name] CLONIDINE HCL 0.1 MG PO TABS Oral Take 0.1 mg by mouth daily.     . DEXLANSOPRAZOLE 60 MG PO  CPDR Oral Take 60 mg by mouth daily.    [name] ESTRADIOL 2 MG PO TABS Oral Take 2 mg by mouth daily.    . FENOFIBRATE 145 MG PO TABS Oral Take 145 mg by mouth daily.      . FUROSEMIDE 20 MG PO TABS Oral Take 1 tablet (20 mg total) by mouth 2 (two) times daily. 30 tablet 4  . GLYBURIDE 2.5 MG PO TABS Oral Take 1 tablet (2.5 mg total) by mouth daily with breakfast. 30 tablet 5  . LEVOFLOXACIN 500 MG PO TABS Oral Take 1 tablet (500 mg total) by mouth daily. 10 tablet 0  . LORAZEPAM 1 MG PO TABS Oral Take 1 mg by mouth every 8 (eight) hours as needed. For anxiety.    [name] LOSARTAN POTASSIUM 50 MG PO TABS Oral Take 50 mg by mouth at bedtime.    [name] METFORMIN HCL 1000 MG PO TABS Oral Take 1 tablet (1,000 mg total) by mouth 2 (two) times daily with a meal. 180 tablet 3  . MOMETASONE FURO-FORMOTEROL FUM 200-5 MCG/ACT IN AERO Inhalation Inhale 2 puffs into the lungs 2 (two) times daily. 8.8 g 1  . NITROGLYCERIN 0.4 MG SL SUBL Sublingual Place 0.4 mg under the tongue every 5 (five) minutes as needed. For chest pain.    [name] POTASSIUM CHLORIDE CRYS ER 20 MEQ PO TBCR Oral Take 20 mEq by mouth 3 (three) times daily.    [name] PREDNISONE 20 MG PO TABS  One tab twice daily for 5 days, then one tab for 5 days 15 tablet 0  . VENLAFAXINE HCL ER 75 MG PO CP24 Oral Take 75 mg by mouth 2 (two) times daily.      BP 142/77  Pulse 81  Temp 97.8 F (36.6 C) (Oral)  Resp 22  SpO2 100%  Physical Exam General: Well-developed, well-nourished female in no acute distress; appearance consistent with age of record HENT: normocephalic, atraumatic Eyes: pupils equal round and reactive to light; extraocular muscles intact Neck: supple Heart: regular rate and rhythm Lungs: Wheezing on expiration Abdomen: soft; obese; nontender; bowel sounds present Extremities: No deformity; full range of motion; pulses normal; trace edema of lower legs Neurologic: Awake, alert and oriented; motor function intact in all extremities and symmetric; no  facial droop; constant movement of lower extremity Skin: Warm and dry Psychiatric: Normal mood and affect    ED Course  Procedures (including critical care time)     MDM   Nursing notes and  vitals signs, including pulse oximetry, reviewed.  Summary of this visit's results, reviewed by myself:  Labs:  Results for orders placed during the hospital encounter of 07/16/12  GLUCOSE, CAPILLARY      Component Value Range   Glucose-Capillary 301 (*) 70 - 99 mg/dL  CBC      Component Value Range   WBC 18.4 (*) 4.0 - 10.5 K/uL   RBC 3.89  3.87 - 5.11 MIL/uL   Hemoglobin 11.9 (*) 12.0 - 15.0 g/dL   HCT 45.4 (*) 09.8 - 11.9 %   MCV 91.3  78.0 - 100.0 fL   MCH 30.6  26.0 - 34.0 pg   MCHC 33.5  30.0 - 36.0 g/dL   RDW 14.7  82.9 - 56.2 %   Platelets 409 (*) 150 - 400 K/uL  COMPREHENSIVE METABOLIC PANEL      Component Value Range   Sodium 135  135 - 145 mEq/L   Potassium 4.0  3.5 - 5.1 mEq/L   Chloride 94 (*) 96 - 112 mEq/L   CO2 26  19 - 32 mEq/L   Glucose, Bld 311 (*) 70 - 99 mg/dL   BUN 18  6 - 23 mg/dL   Creatinine, Ser 1.30  0.50 - 1.10 mg/dL   Calcium 86.5  8.4 - 78.4 mg/dL   Total Protein 7.3  6.0 - 8.3 g/dL   Albumin 4.0  3.5 - 5.2 g/dL   AST 16  0 - 37 U/L   ALT 17  0 - 35 U/L   Alkaline Phosphatase 77  39 - 117 U/L   Total Bilirubin 0.4  0.3 - 1.2 mg/dL   GFR calc non Af Amer >90  >90 mL/min   GFR calc Af Amer >90  >90 mL/min  URINALYSIS, ROUTINE W REFLEX MICROSCOPIC      Component Value Range   Color, Urine YELLOW  YELLOW   APPearance CLOUDY (*) CLEAR   Specific Gravity, Urine 1.040 (*) 1.005 - 1.030   pH 5.0  5.0 - 8.0   Glucose, UA >1000 (*) NEGATIVE mg/dL   Hgb urine dipstick NEGATIVE  NEGATIVE   Bilirubin Urine NEGATIVE  NEGATIVE   Ketones, ur 15 (*) NEGATIVE mg/dL   Protein, ur NEGATIVE  NEGATIVE mg/dL   Urobilinogen, UA 0.2  0.0 - 1.0 mg/dL   Nitrite NEGATIVE  NEGATIVE   Leukocytes, UA NEGATIVE  NEGATIVE  GLUCOSE, CAPILLARY      Component Value Range    Glucose-Capillary 231 (*) 70 - 99 mg/dL   Comment 1 D    URINE MICROSCOPIC-ADD ON      Component Value Range   Squamous Epithelial / LPF FEW (*) RARE   WBC, UA 0-2  <3 WBC/hpf   RBC / HPF 0-2  <3 RBC/hpf   Bacteria, UA RARE  RARE   4:17 AM Patient sugar now under control after IV fluid bolus and IV insulin. Bronchospasm has been treated with albuterol and Atrovent. Patient is now comfortable. Lungs clear.         Hanley Seamen, MD 07/17/12 [phone]

## 2012-07-16 NOTE — ED Notes (Signed)
[name] presently being tx for resp infection with levaquin and a z-pack and steroids.  D/t the steroids the pt blood sugar has been increasingly elevated.  Pt cbg was in 300's at home, even after taking her nightly 1000 mg of metformin.  She has been feeling dizzy, nauseated with intermittent diaphoresis and was told by her RN to come to ED.

## 2012-07-16 NOTE — ED Notes (Signed)
Pt reports on prednisone d/t upper respiratory infection, and has increased her sugar to 342 at home; having blurred vision, nausea, increased urination, and increased thirst

## 2012-07-17 ENCOUNTER — Telehealth: Payer: Self-pay | Admitting: Internal Medicine

## 2012-07-17 LAB — GLUCOSE, CAPILLARY
Glucose-Capillary: 200 mg/dL — ABNORMAL HIGH (ref 70–99)
Glucose-Capillary: 231 mg/dL — ABNORMAL HIGH (ref 70–99)
Glucose-Capillary: 239 mg/dL — ABNORMAL HIGH (ref 70–99)

## 2012-07-17 LAB — URINALYSIS, ROUTINE W REFLEX MICROSCOPIC
Bilirubin Urine: NEGATIVE
Glucose, UA: >1000 mg/dL — AB
Hgb urine dipstick: NEGATIVE
Ketones, ur: 15 mg/dL — AB
Leukocytes, UA: NEGATIVE
Nitrite: NEGATIVE
Protein, ur: NEGATIVE mg/dL
Specific Gravity, Urine: 1.04 — ABNORMAL HIGH (ref 1.005–1.030)
Urobilinogen, UA: 0.2 mg/dL (ref 0.0–1.0)
pH: 5 (ref 5.0–8.0)

## 2012-07-17 LAB — URINE MICROSCOPIC-ADD ON

## 2012-07-17 MED ORDER — ACETAMINOPHEN 325 MG PO TABS
975.0000 mg | ORAL_TABLET | Freq: Once | ORAL | Status: AC
  Administered 2012-07-17: 975 mg via ORAL
  Filled 2012-07-17: qty 3

## 2012-07-17 MED ORDER — ALBUTEROL SULFATE (5 MG/ML) 0.5% IN NEBU
5.0000 mg | INHALATION_SOLUTION | Freq: Once | RESPIRATORY_TRACT | Status: AC
  Administered 2012-07-17: 5 mg via RESPIRATORY_TRACT
  Filled 2012-07-17: qty 1

## 2012-07-17 MED ORDER — IPRATROPIUM BROMIDE 0.02 % IN SOLN
0.5000 mg | Freq: Once | RESPIRATORY_TRACT | Status: AC
  Administered 2012-07-17: 0.5 mg via RESPIRATORY_TRACT
  Filled 2012-07-17: qty 2.5

## 2012-07-17 MED ORDER — ALBUTEROL SULFATE (5 MG/ML) 0.5% IN NEBU
5.0000 mg | INHALATION_SOLUTION | Freq: Once | RESPIRATORY_TRACT | Status: AC
  Administered 2012-07-17: 5 mg via RESPIRATORY_TRACT
  Filled 2012-07-17: qty 40

## 2012-07-17 MED ORDER — ACETAMINOPHEN 500 MG PO TABS: 1000.0000 mg | ORAL_TABLET | Freq: Once | ORAL | Status: DC

## 2012-07-17 NOTE — ED Notes (Signed)
Pt c/o increased sob.  Lung sounds diminished from last ascultation.  Dr Read Drivers notified.

## 2012-07-17 NOTE — ED Notes (Signed)
Pt states she feels decreased sob after most recent nebx.  Lung sounds improved.  Dr Read Drivers notified.

## 2012-07-17 NOTE — Telephone Encounter (Signed)
Caller: William/Spouse; Patient Name: [name]; PCP: Birdie Sons (Adults only); Best Callback Phone Number: [phone] Follow up from 10-6 call regarding elevated Blood Sugar.  Pt was seen at [facility] after triaged by RN on 10-6, insulin and fluids given.  ER MD advised Patient to follow up with PCP to see if MD wants to increase Metformin.  FSBS 259 on 10-7. [name] not with Spouse currently.  Called Pt directly, [phone].  Pt on Prednisone for URI, given by Urgent Care on 9-29, seen in office on 10-4, Pt has 2 more weeks of Prednisone.  All emergent symptoms ruled out per Diabetes Control Problems, call provider due to taking pills and blood sugar 250 or higher for 2 measurements at least 8 hrs apart.  Please review with Dr Cato Mulligan and follow up with Pt if MD would like to increase Metformin while on Prednisone.

## 2012-07-17 NOTE — Telephone Encounter (Signed)
Please advise 

## 2012-07-17 NOTE — Telephone Encounter (Signed)
Increasing metformin will not help. Probably worth starting lantus 20 units daily- Monitor CBG 3 times daily-- call in one week with results of CBGs

## 2012-07-17 NOTE — Telephone Encounter (Signed)
[facility] Triage Call Report Triage Record Num: [id number] Operator: Rebeca Allegra Patient Name: [name] Call Date & Time: 07/16/2012 8:28:01PM Patient Phone: [phone] PCP: Valetta Mole. Swords Patient Gender: Female PCP Fax : [phone] Patient DOB: [birth year] [facility] Name[facility]  Reason for Call:  Caller: Lilli Few; PCP: Birdie Sons (Adults only); CB#: [phone]; Call regarding Diabetes; 07/16/12 1930 BG:330; Pt was feeling "shaky" and slightly light-headed, which have now subsided. [name] on Prednisone daily for Sarcoidosis. Pt was increased to 40mg  from 10mg  daily x2 weeks for an URI. 07/16/12 2040 BG: 314 with slight dizziness. All emergent symptoms ruled out per Diabetes: Control problems protocol. Pt has not yet taken her evening Metformin dose. Follow up call to recheck pts BG set up. Emergent and call back parameters given. Follow up call made: 07/16/12 2215: Metformin taken 07/16/12 2115. 07/16/12 BG 314 and now reports blurry vision and a headache. Weber Cooks to have pt seen at [facility].  Protocol(s) Used: Diabetes: Control Problems Recommended Outcome per Protocol: See Provider within 24 hours Reason for Outcome: All other situations  Care Advice: Go to ED immediately if blood sugar is 300 mg/dl or more AND symptoms are present including: large urine ketones, rapid, deep breathing, fruity breath, nausea, vomiting or abdominal pain; confused thinking, dry, flushed skin; extreme thirst, extreme fatigue or weakness; or frequent urination. ~ Blood Sugar and Ketone Testing: - Check blood sugar as recommended, and more often whenever it has been high or low, any change to the treatment plan, increased stress, illness, infection, or a change to the regular schedule. - Test for urine ketones anytime the blood sugar is above 250 mg/dl, when there is stress, acute illness, nausea, vomiting, or abdominal pain. - Follow action plan for  illness. - Record blood sugar and ketones in meter log or separate logbook, and take to all provider visits. - Drink extra water and other non-sugar fluids to prevent dehydration when the blood sugar is high and urine ketones

## 2012-07-18 DIAGNOSIS — J45909 Unspecified asthma, uncomplicated: Secondary | ICD-10-CM | POA: Diagnosis not present

## 2012-07-18 DIAGNOSIS — D869 Sarcoidosis, unspecified: Secondary | ICD-10-CM | POA: Diagnosis not present

## 2012-07-18 DIAGNOSIS — J209 Acute bronchitis, unspecified: Secondary | ICD-10-CM | POA: Diagnosis not present

## 2012-07-18 MED ORDER — INSULIN GLARGINE 100 UNIT/ML ~~LOC~~ SOLN: 20.0000 [IU] | Freq: Every day | SUBCUTANEOUS | Status: DC

## 2012-07-18 MED ORDER — INSULIN PEN NEEDLE 31G X 8 MM MISC: 1.0000 | Freq: Every day | Status: DC

## 2012-07-18 NOTE — Telephone Encounter (Signed)
Pt aware, rx sent in electronically 

## 2012-07-19 ENCOUNTER — Encounter: Payer: Self-pay | Admitting: Critical Care Medicine

## 2012-07-19 ENCOUNTER — Ambulatory Visit (INDEPENDENT_AMBULATORY_CARE_PROVIDER_SITE_OTHER): Payer: BC Managed Care – PPO | Admitting: Critical Care Medicine

## 2012-07-19 VITALS — BP 150/80 | HR 92 | Temp 98.7°F | Ht 66.5 in | Wt 266.0 lb

## 2012-07-19 DIAGNOSIS — J209 Acute bronchitis, unspecified: Secondary | ICD-10-CM | POA: Diagnosis not present

## 2012-07-19 IMAGING — CT CT ANGIO CHEST
2 of 8 series · 12 of 36 positions shown · IV contrast (CONTRAST)
Comparison: 05/27/2011

CLINICAL DATA: Left chest pain radiating into back.

CT ANGIOGRAPHY CHEST WITH CONTRAST
TECHNIQUE: Multidetector CT imaging of the chest was performed
using the standard protocol during bolus administration of
intravenous contrast.  Multiplanar CT image reconstructions
including MIPs were obtained to evaluate the vascular anatomy.
Contrast: 150mL OMNIPAQUE IOHEXOL 350 MG/ML IV SOLN

[Series 5: taa · axial · 0.74mm/px · z∈[-286,-46]mm · 11 of 145 slices shown]
[im 13/145  lung]
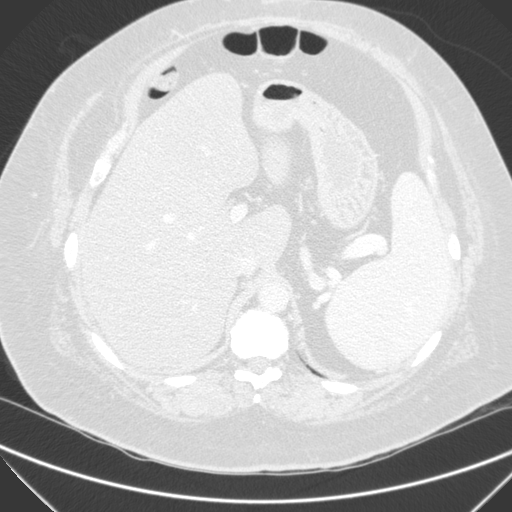
[im 25/145  mediastinal]
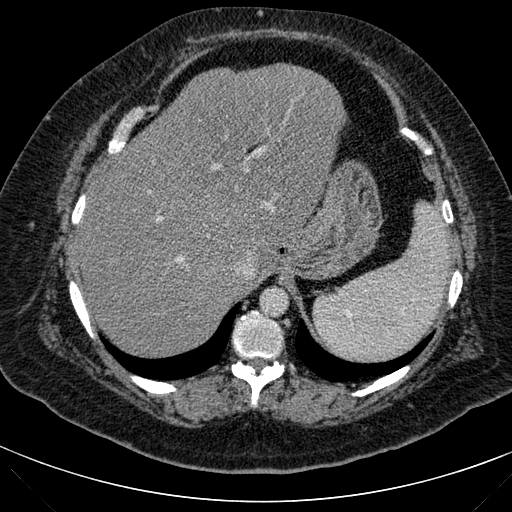
[im 37/145  lung]
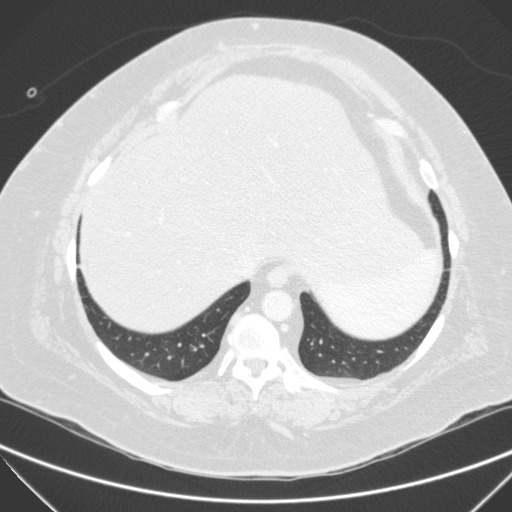
[im 49/145  mediastinal]
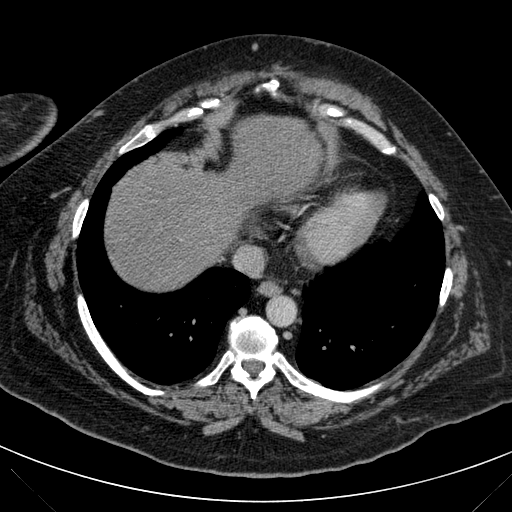
[im 61/145  lung]
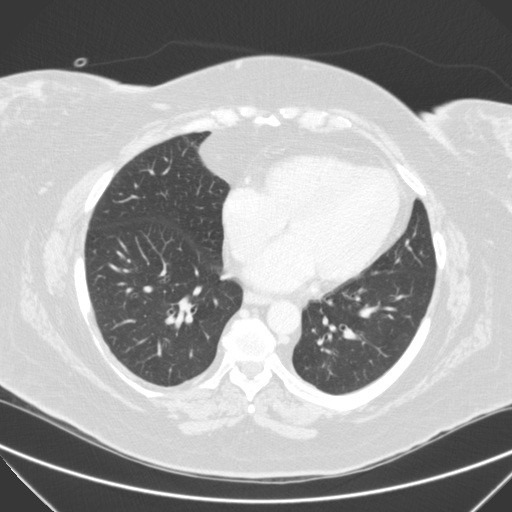
[im 73/145  mediastinal]
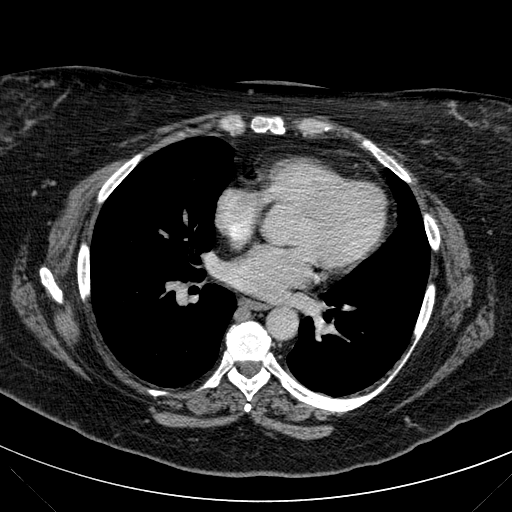
[im 85/145  lung]
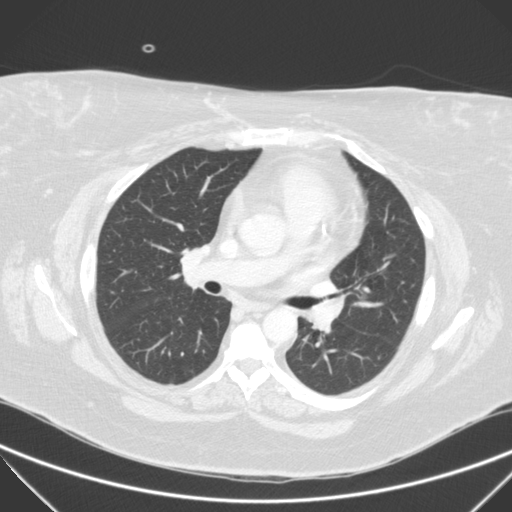
[im 97/145  mediastinal]
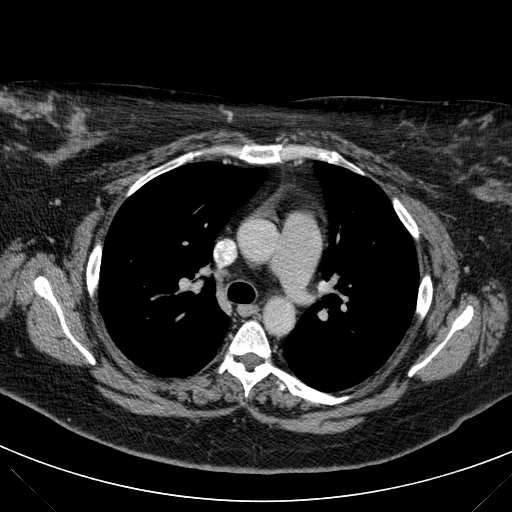
[im 109/145  lung]
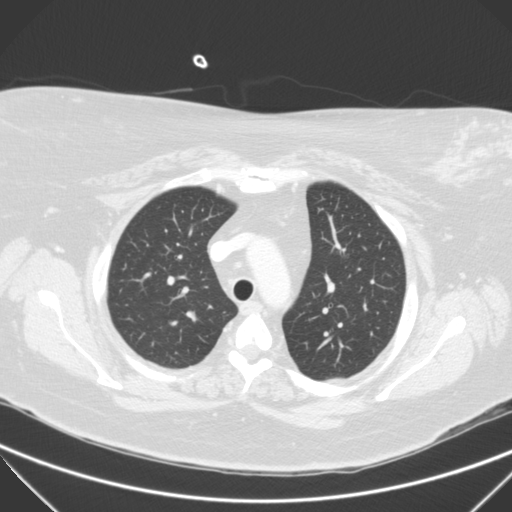
[im 121/145  mediastinal]
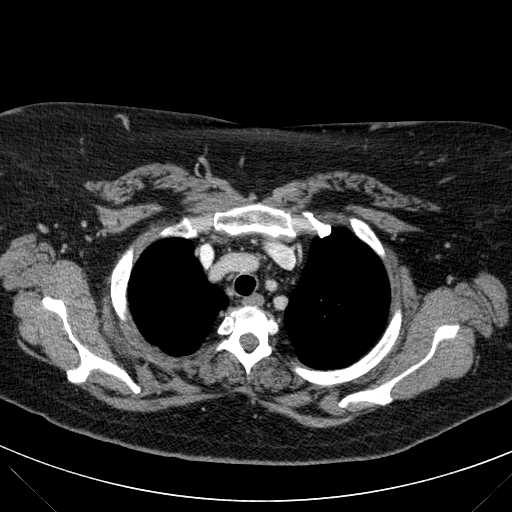
[im 133/145  lung]
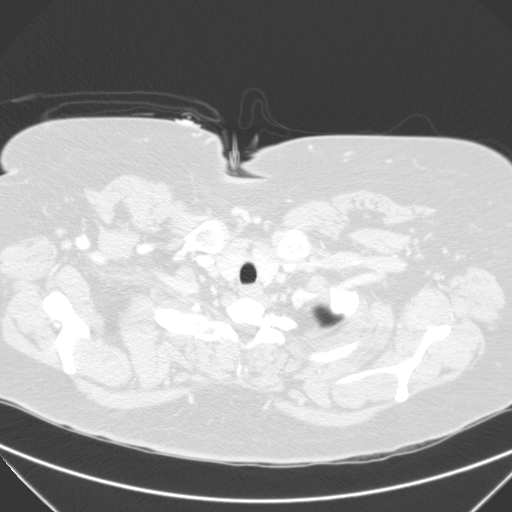

[mpr, coronal mpr, coronal · coronal · 0.74mm/px · 1 of 151 slices shown]
[im 76/151  mediastinal]
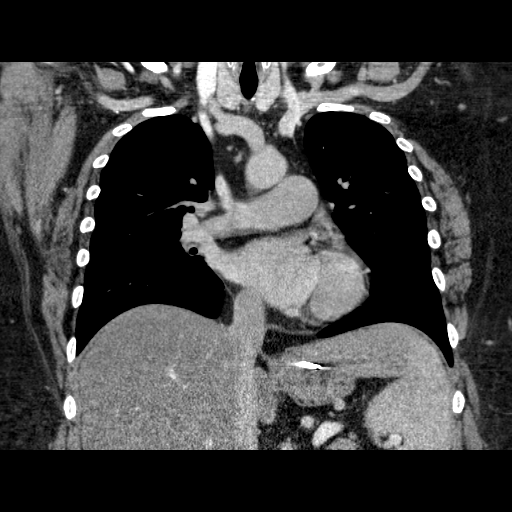

[12 of 36 positions shown; findings below may reference images not displayed]

FINDINGS: Initial scan only included the aortic arch and part of
the ascending and descending thoracic aorta before there was
inadvertent stoppage of the CT scanner.  The patient was reinjected
with contrast for a second scan.  The aorta is of normal caliber.
No evidence of aortic dissection.  Central pulmonary arteries show
normal patency.  There is no evidence of pulmonary edema,
infiltrate, pneumothorax or pulmonary nodule.  No pleural or
pericardial fluid.

No enlarged lymph nodes identified.  Focal calcified plaque present
in the region of the LAD or diagonal branch.  Visualized liver
shows diffuse fatty infiltration.  There are clips in the region of
the GE junction from prior surgery.

Review of the MIP images confirms the above findings.
IMPRESSION: 1.  No evidence of acute aortic dissection.
2.  No acute findings.
3.  Focal calcified plaque in the region of the LAD or a diagonal
branch.

## 2012-07-19 IMAGING — CR DG CHEST 2V
2 series · 2 of 2 positions shown · non-contrast
Comparison: 05/27/2011

CLINICAL DATA: Chest pain.

CHEST - 2 VIEW

[w chest pa]
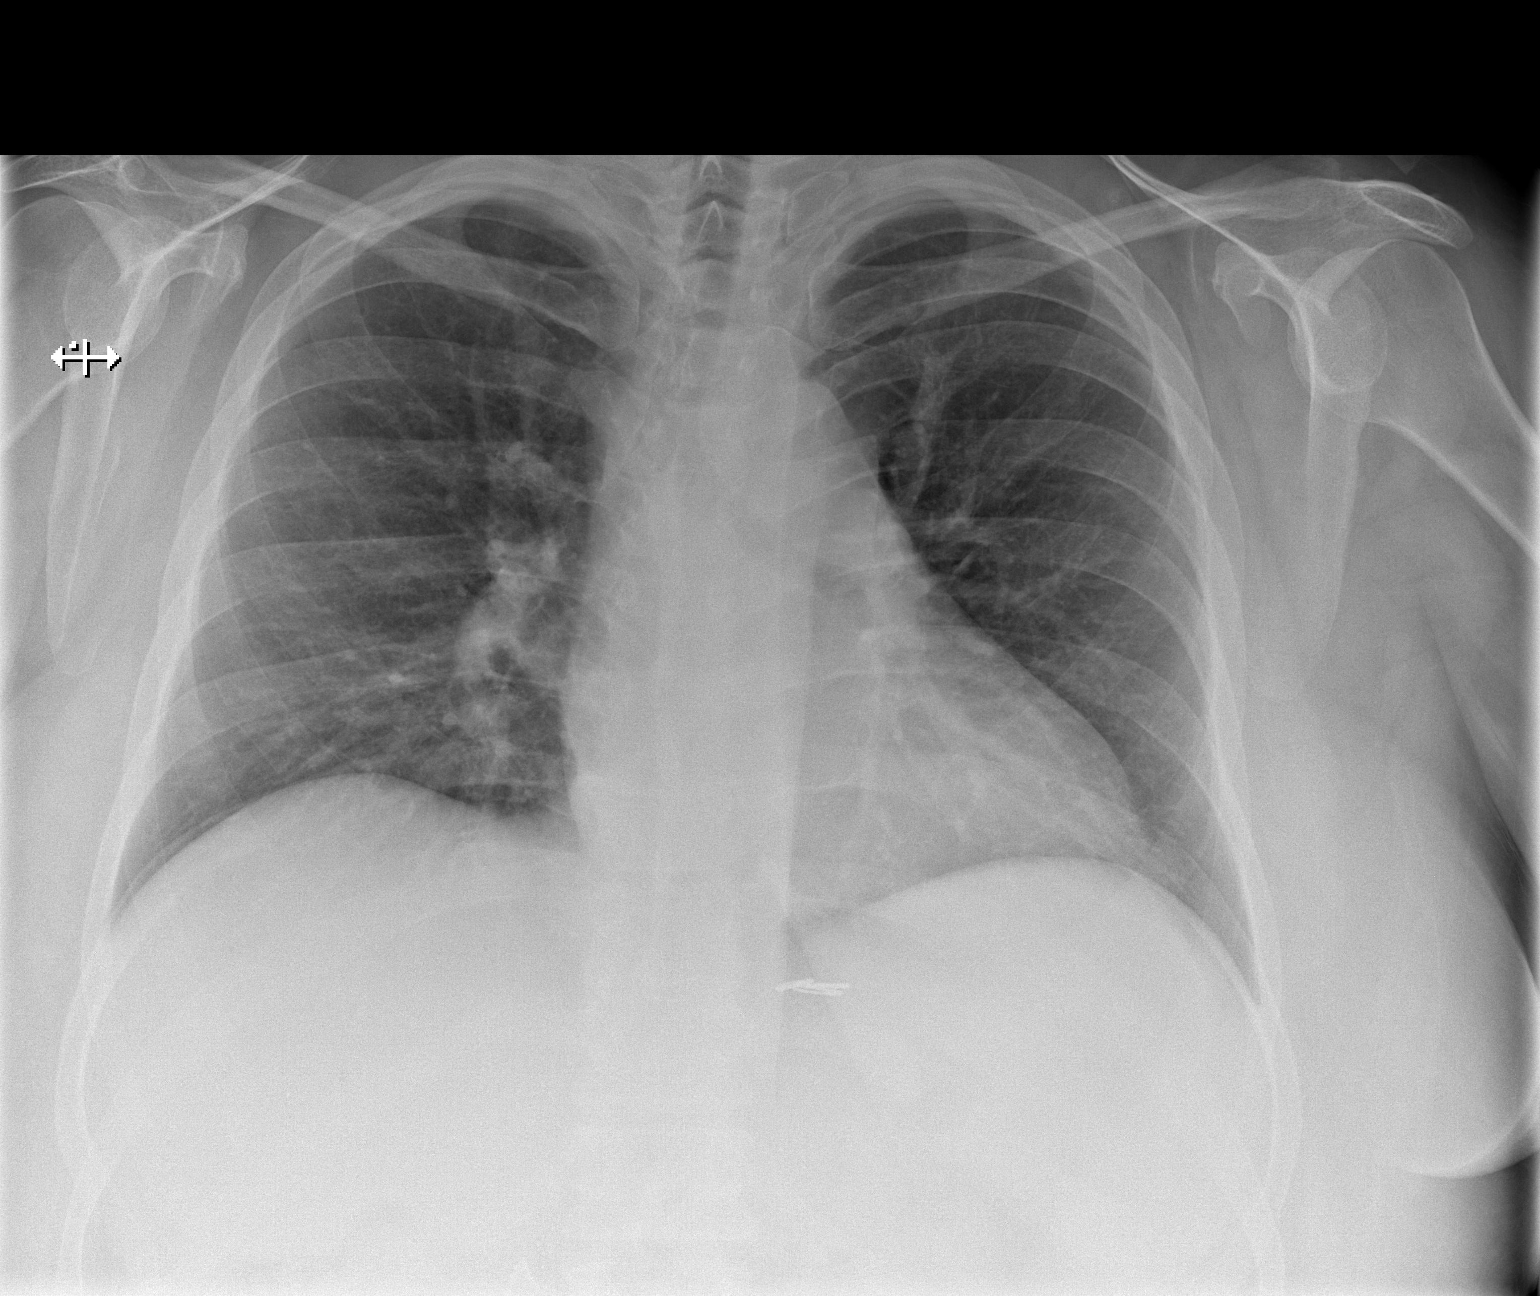

[w chest lat]
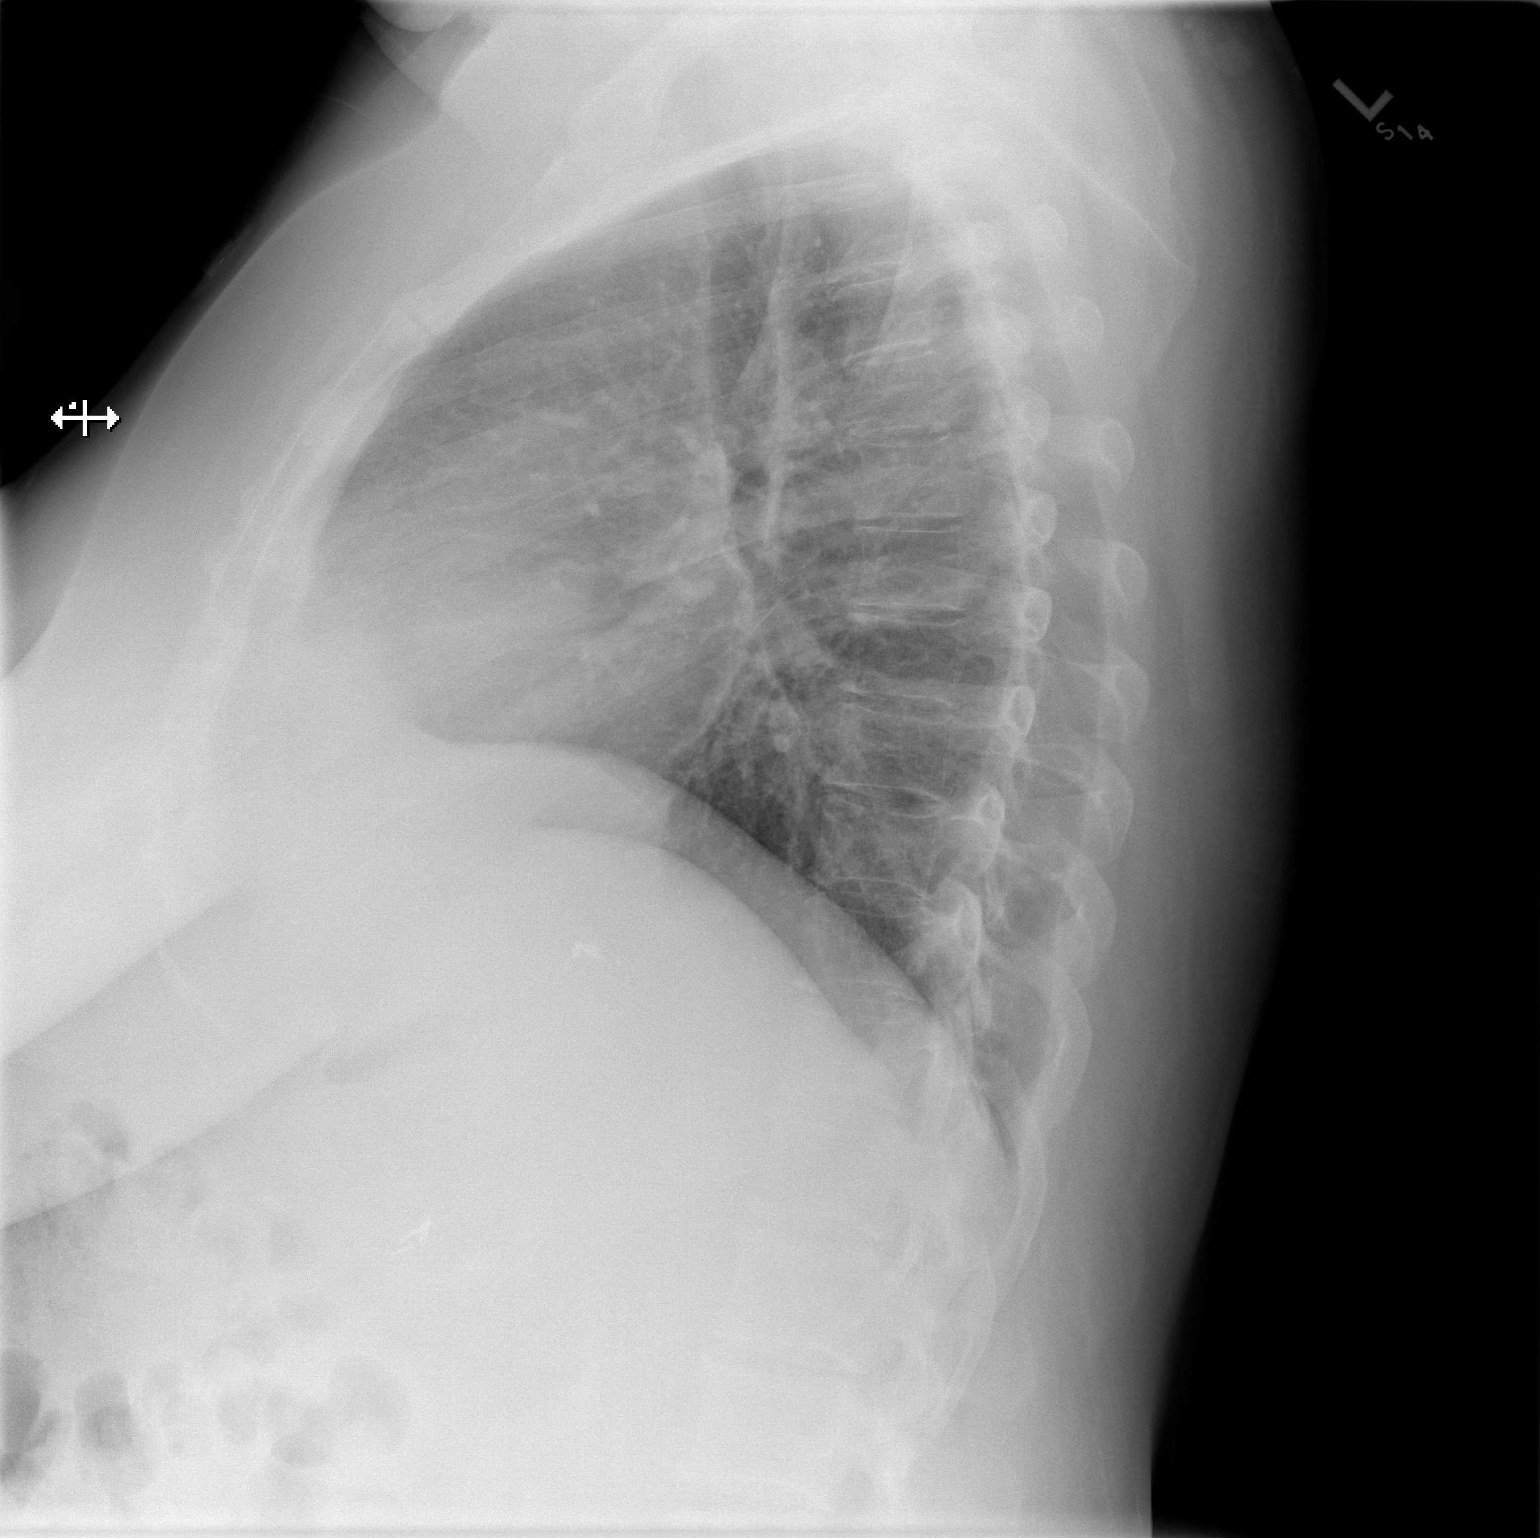

[2 of 2 positions shown; findings below may reference images not displayed]

FINDINGS: Heart and mediastinal contours are within normal limits.
No focal opacities or effusions.  No acute bony abnormality.
IMPRESSION: No active cardiopulmonary disease.

## 2012-07-19 MED ORDER — HYDROCOD POLST-CHLORPHEN POLST 10-8 MG/5ML PO LQCR: 10.0000 mL | Freq: Two times a day (BID) | ORAL | Status: DC | PRN

## 2012-07-19 MED ORDER — MOMETASONE FUROATE 50 MCG/ACT NA SUSP: 2.0000 | Freq: Every day | NASAL | Status: DC

## 2012-07-19 MED ORDER — BENZONATATE 100 MG PO CAPS: 100.0000 mg | ORAL_CAPSULE | Freq: Four times a day (QID) | ORAL | Status: DC | PRN

## 2012-07-19 MED ORDER — CHLORPHENIRAMINE TANNATE 12 MG PO TABS: ORAL_TABLET | ORAL | Status: DC

## 2012-07-19 MED ORDER — AMOXICILLIN-POT CLAVULANATE 875-125 MG PO TABS: 1.0000 | ORAL_TABLET | Freq: Two times a day (BID) | ORAL | Status: DC

## 2012-07-19 NOTE — Assessment & Plan Note (Addendum)
Acute tracheobronchitis with flare with associated sinusitis flare Plan Start cyclic cough protocol with tussionex/tessalon Start Augmentin twice daily for 10days, finish levaquin Finish prednisone , no refill Nasonex daily Stop chlorpheniramine for two weeks then can restart Strict reflux diet Return 2 weeks

## 2012-07-19 NOTE — Progress Notes (Signed)
Subjective:    Patient ID: [name], female    DOB: 1961/08/21, 51 y.o.   MRN: [id number]  HPI 51 y.o. WF with Sarcoid, GERD, VCD, cyclical cough  07/19/2012 Ill with cough. Rx pred : 40x 5days then 20/d for 5days. Plus levaquin x 10days (2 left).  Pt ill since 2 weeks. First went to Urgent Care.  Rx steroids.  BS up and down.  3 ED visits in 6 months Now on insulin.    Now: cough is dry.  Denies heartburn.  No prob swallowing.  Notes qhs dyspnea and cough.  Notes dyspnea on exertion.  No chest xray has been done. No f/c/s   Past Medical History  Diagnosis Date  . Sarcoidosis 04/10/2007  . DIABETES MELLITUS, TYPE II 08/21/2006  . HYPERLIPIDEMIA 08/21/2006  . GOUT 08/20/2010  . OBESITY 06/04/2009  . ANEMIA-UNSPECIFIED 09/18/2009  . HYPERTENSION 08/21/2006  . GERD 08/21/2006  . Shortness of breath   . SLEEP APNEA 04/21/2009    last testing was negative     Family History  Problem Relation Age of Onset  . Diabetes Father   . Heart attack Father   . Coronary artery disease Father   . Heart failure Father   . COPD Mother   . Emphysema Mother   . Asthma Mother   . Heart failure Mother   . Sarcoidosis Maternal Uncle   . Colon cancer Neg Hx   . Lung cancer Brother   . Cancer Brother      History   Social History  . Marital Status: Married    Spouse Name: N/A    Number of Children: N/A  . Years of Education: N/A   Occupational History  . DISABLED    Social History Main Topics  . Smoking status: Never Smoker   . Smokeless tobacco: Never Used  . Alcohol Use: No  . Drug Use: No  . Sexually Active: Yes   Other Topics Concern  . Not on file   Social History Narrative  . No narrative on file     Allergies  Allergen Reactions  . Lisinopril Cough  . Methotrexate     REACTION: peri-oral and buccal lesions.     Outpatient Prescriptions Prior to Visit  Medication Sig Dispense Refill  . adalimumab (HUMIRA) 40 MG/0.8ML injection Inject 40 mg into the skin  every 14 (fourteen) days. Inject every other Friday.      [name] albuterol (PROVENTIL HFA;VENTOLIN HFA) 108 (90 BASE) MCG/ACT inhaler Inhale 2 puffs into the lungs every 6 (six) hours as needed. For wheezing.      [name] albuterol (PROVENTIL) (5 MG/ML) 0.5% nebulizer solution Take 2.5 mg by nebulization every 6 (six) hours as needed. For wheezing.      [name] aspirin EC 81 MG tablet Take 81 mg by mouth daily.      [name] atenolol (TENORMIN) 50 MG tablet Take 50 mg by mouth daily.      [name] atorvastatin (LIPITOR) 40 MG tablet TAKE ONE TABLET BY MOUTH ONE TIME DAILY  90 tablet  0  . buPROPion (WELLBUTRIN XL) 300 MG 24 hr tablet Take 1 tablet (300 mg total) by mouth daily.  30 tablet  4  . cholecalciferol (VITAMIN D) 1000 UNITS tablet Take 1,000 Units by mouth 2 (two) times daily.      . ciclesonide (OMNARIS) 50 MCG/ACT nasal spray Place 1 spray into both nostrils daily.       . cloNIDine (CATAPRES) 0.1 MG tablet Take 0.1  mg by mouth daily.       [name] dexlansoprazole (DEXILANT) 60 MG capsule Take 60 mg by mouth daily.      [name] estradiol (ESTRACE) 2 MG tablet Take 2 mg by mouth daily.      . fenofibrate (TRICOR) 145 MG tablet Take 145 mg by mouth daily.        . furosemide (LASIX) 20 MG tablet Take 1 tablet (20 mg total) by mouth 2 (two) times daily.  30 tablet  4  . glyBURIDE (DIABETA) 2.5 MG tablet Take 1 tablet (2.5 mg total) by mouth daily with breakfast.  30 tablet  5  . insulin glargine (LANTUS SOLOSTAR) 100 UNIT/ML injection Inject 20 Units into the skin at bedtime.  1 pen  3  . Insulin Pen Needle 31G X 8 MM MISC 1 each by Does not apply route daily.  30 each  3  . levofloxacin (LEVAQUIN) 500 MG tablet Take 1 tablet (500 mg total) by mouth daily.  10 tablet  0  . LORazepam (ATIVAN) 1 MG tablet Take 1 mg by mouth every 8 (eight) hours as needed. For anxiety.      [name] losartan (COZAAR) 50 MG tablet Take 50 mg by mouth at bedtime.      . metFORMIN (GLUCOPHAGE) 1000 MG tablet Take 1 tablet (1,000 mg total) by mouth 2 (two)  times daily with a meal.  180 tablet  3  . Mometasone Furo-Formoterol Fum (DULERA) 200-5 MCG/ACT AERO Inhale 2 puffs into the lungs 2 (two) times daily.  8.8 g  1  . nitroGLYCERIN (NITROSTAT) 0.4 MG SL tablet Place 0.4 mg under the tongue every 5 (five) minutes as needed. For chest pain.      . potassium chloride SA (K-DUR,KLOR-CON) 20 MEQ tablet Take 20 mEq by mouth 3 (three) times daily.      . predniSONE (DELTASONE) 20 MG tablet One tab twice daily for 5 days, then one tab for 5 days  15 tablet  0  . venlafaxine XR (EFFEXOR-XR) 75 MG 24 hr capsule Take 75 mg by mouth 2 (two) times daily.      . benzonatate (TESSALON) 100 MG capsule Take 1 capsule (100 mg total) by mouth every 6 (six) hours as needed for cough.  60 capsule  0  . Chlorpheniramine Tannate 12 MG TABS Take 1 tablet (12 mg total) by mouth at bedtime.  30 each  6  . chlorpheniramine-HYDROcodone (TUSSIONEX) 10-8 MG/5ML LQCR Take 10 mLs by mouth every 12 (twelve) hours as needed. For cough.  140 mL  0   Facility-Administered Medications Prior to Visit  Medication Dose Route Frequency Provider Last Rate Last Dose  . albuterol (PROVENTIL) (5 MG/ML) 0.5% nebulizer solution 2.5 mg  2.5 mg Nebulization Once Baker Pierini, FNP         Review of Systems  Constitutional: Negative for fever and unexpected weight change.  HENT: Negative for ear pain, nosebleeds, congestion, sore throat, rhinorrhea, sneezing, trouble swallowing, dental problem, postnasal drip and sinus pressure.   Eyes: Negative for redness and itching.  Respiratory: Positive for cough. Negative for chest tightness, shortness of breath and wheezing.        Cough is improved   Cardiovascular: Negative for palpitations and leg swelling.  Gastrointestinal: Negative for nausea and vomiting.  Genitourinary: Negative for dysuria.  Musculoskeletal: Negative for joint swelling.  Skin: Negative for rash.  Neurological: Negative for headaches.  Hematological: Does not  bruise/bleed easily.  Psychiatric/Behavioral: Negative for dysphoric  mood. The patient is not nervous/anxious.        Objective:   Physical Exam  Obese female in nad [place] Vitals:   07/19/12 1134  BP: 150/80  Pulse: 92  Temp: 98.7 F (37.1 C)  TempSrc: Oral  Height: 5' 6.5" (1.689 m)  Weight: 266 lb (120.657 kg)  SpO2: 97%    Gen: Pleasant, obese  in no distress,  Flat  affect  [id number] nares  Neck: No JVD, no TMG, no carotid bruits  Lungs: No use of accessory muscles, no dullness to percussion, clear without rales or rhonchi, mild pseudowheeze  Cardiovascular: RRR, heart sounds normal, no murmur or gallops, no peripheral edema  Abdomen: soft and NT, no HSM,  BS normal  Musculoskeletal: No deformities, no cyanosis or clubbing  Neuro: alert, non focal  Skin: Warm, no lesions or rashes    Assessment & Plan:   Acute bronchitis Acute tracheobronchitis with flare with associated sinusitis flare Plan Start cyclic cough protocol with tussionex/tessalon Start Augmentin twice daily for 10days, finish levaquin Finish prednisone , no refill Nasonex daily Stop chlorpheniramine for two weeks then can restart Strict reflux diet Return 2 weeks      Updated Medication List Outpatient Encounter Prescriptions as of 07/19/2012  Medication Sig Dispense Refill  . adalimumab (HUMIRA) 40 MG/0.8ML injection Inject 40 mg into the skin every 14 (fourteen) days. Inject every other Friday.      [name] albuterol (PROVENTIL HFA;VENTOLIN HFA) 108 (90 BASE) MCG/ACT inhaler Inhale 2 puffs into the lungs every 6 (six) hours as needed. For wheezing.      [name] albuterol (PROVENTIL) (5 MG/ML) 0.5% nebulizer solution Take 2.5 mg by nebulization every 6 (six) hours as needed. For wheezing.      [name] aspirin EC 81 MG tablet Take 81 mg by mouth daily.      [name] atenolol (TENORMIN) 50 MG tablet Take 50 mg by mouth daily.      [name] atorvastatin (LIPITOR) 40 MG tablet TAKE ONE TABLET BY MOUTH ONE TIME DAILY   90 tablet  0  . benzonatate (TESSALON) 100 MG capsule Take 1 capsule (100 mg total) by mouth every 6 (six) hours as needed for cough.  60 capsule  4  . buPROPion (WELLBUTRIN XL) 300 MG 24 hr tablet Take 1 tablet (300 mg total) by mouth daily.  30 tablet  4  . Chlorpheniramine Tannate 12 MG TABS HOLD for two weeks  30 each  6  . chlorpheniramine-HYDROcodone (TUSSIONEX) 10-8 MG/5ML LQCR Take 10 mLs by mouth every 12 (twelve) hours as needed. For cough.  240 mL  0  . cholecalciferol (VITAMIN D) 1000 UNITS tablet Take 1,000 Units by mouth 2 (two) times daily.      . ciclesonide (OMNARIS) 50 MCG/ACT nasal spray Place 1 spray into both nostrils daily.       . cloNIDine (CATAPRES) 0.1 MG tablet Take 0.1 mg by mouth daily.       [name] dexlansoprazole (DEXILANT) 60 MG capsule Take 60 mg by mouth daily.      [name] estradiol (ESTRACE) 2 MG tablet Take 2 mg by mouth daily.      . fenofibrate (TRICOR) 145 MG tablet Take 145 mg by mouth daily.        . furosemide (LASIX) 20 MG tablet Take 1 tablet (20 mg total) by mouth 2 (two) times daily.  30 tablet  4  . glyBURIDE (DIABETA) 2.5 MG tablet Take 1 tablet (2.5 mg total) by mouth daily with  breakfast.  30 tablet  5  . HYDROcodone-homatropine (HYCODAN) 5-1.5 MG/5ML syrup As needed      . insulin glargine (LANTUS SOLOSTAR) 100 UNIT/ML injection Inject 20 Units into the skin at bedtime.  1 pen  3  . Insulin Pen Needle 31G X 8 MM MISC 1 each by Does not apply route daily.  30 each  3  . levofloxacin (LEVAQUIN) 500 MG tablet Take 1 tablet (500 mg total) by mouth daily.  10 tablet  0  . LORazepam (ATIVAN) 1 MG tablet Take 1 mg by mouth every 8 (eight) hours as needed. For anxiety.      [name] losartan (COZAAR) 50 MG tablet Take 50 mg by mouth at bedtime.      . metFORMIN (GLUCOPHAGE) 1000 MG tablet Take 1 tablet (1,000 mg total) by mouth 2 (two) times daily with a meal.  180 tablet  3  . Mometasone Furo-Formoterol Fum (DULERA) 200-5 MCG/ACT AERO Inhale 2 puffs into the lungs 2  (two) times daily.  8.8 g  1  . nitroGLYCERIN (NITROSTAT) 0.4 MG SL tablet Place 0.4 mg under the tongue every 5 (five) minutes as needed. For chest pain.      . potassium chloride SA (K-DUR,KLOR-CON) 20 MEQ tablet Take 20 mEq by mouth 3 (three) times daily.      . predniSONE (DELTASONE) 20 MG tablet One tab twice daily for 5 days, then one tab for 5 days  15 tablet  0  . venlafaxine XR (EFFEXOR-XR) 75 MG 24 hr capsule Take 75 mg by mouth 2 (two) times daily.      [name] DISCONTD: benzonatate (TESSALON) 100 MG capsule Take 1 capsule (100 mg total) by mouth every 6 (six) hours as needed for cough.  60 capsule  0  . DISCONTD: Chlorpheniramine Tannate 12 MG TABS Take 1 tablet (12 mg total) by mouth at bedtime.  30 each  6  . DISCONTD: chlorpheniramine-HYDROcodone (TUSSIONEX) 10-8 MG/5ML LQCR Take 10 mLs by mouth every 12 (twelve) hours as needed. For cough.  140 mL  0  . amoxicillin-clavulanate (AUGMENTIN) 875-125 MG per tablet Take 1 tablet by mouth 2 (two) times daily.  20 tablet  0  . mometasone (NASONEX) 50 MCG/ACT nasal spray Place 2 sprays into the nose daily.  17 g  6  . DISCONTD: azithromycin (ZITHROMAX) 250 MG tablet On Hold -- will start after finishing levquin.  Will take on Mon, Wed, and Friday as directed by Dr. Ramond Dial      . DISCONTD: predniSONE (DELTASONE) 5 MG tablet As directed by Dr. Ramond Dial after finishing pred 20 mg from Dr. Artist Pais.       Facility-Administered Encounter Medications as of 07/19/2012  Medication Dose Route Frequency Provider Last Rate Last Dose  . DISCONTD: albuterol (PROVENTIL) (5 MG/ML) 0.5% nebulizer solution 2.5 mg  2.5 mg Nebulization Once Baker Pierini, FNP

## 2012-07-19 NOTE — Patient Instructions (Addendum)
Start cyclic cough protocol with tussionex/tessalon Start Augmentin twice daily for 10days, finish levaquin Finish prednisone , no refill Nasonex daily Stop chlorpheniramine for two weeks then can restart Strict reflux diet Return 2 weeks

## 2012-07-21 ENCOUNTER — Ambulatory Visit: Payer: BC Managed Care – PPO | Admitting: Internal Medicine

## 2012-08-02 ENCOUNTER — Encounter: Payer: Self-pay | Admitting: Adult Health

## 2012-08-02 ENCOUNTER — Ambulatory Visit (INDEPENDENT_AMBULATORY_CARE_PROVIDER_SITE_OTHER): Payer: BC Managed Care – PPO | Admitting: Adult Health

## 2012-08-02 VITALS — BP 132/70 | HR 75 | Temp 97.0°F | Ht 66.0 in | Wt 268.0 lb

## 2012-08-02 DIAGNOSIS — J99 Respiratory disorders in diseases classified elsewhere: Secondary | ICD-10-CM

## 2012-08-02 DIAGNOSIS — D860 Sarcoidosis of lung: Secondary | ICD-10-CM

## 2012-08-02 DIAGNOSIS — D869 Sarcoidosis, unspecified: Secondary | ICD-10-CM

## 2012-08-02 NOTE — Patient Instructions (Addendum)
Continue on current regimen.  follow up Dr. Delford Field  In 2-3 months and As needed

## 2012-08-02 NOTE — Progress Notes (Signed)
  Subjective:    Patient ID: [name], female    DOB: 22-Nov-1960, 51 y.o.   MRN: [id number]  HPI 52 WF with Sarcoid, GERD, VCD, cyclical cough  07/19/2012 Ill with cough. Rx pred : 40x 5days then 20/d for 5days. Plus levaquin x 10days (2 left).  Pt ill since 2 weeks. First went to Urgent Care.  Rx steroids.  BS up and down.  3 ED visits in 6 months Now on insulin.    Now: cough is dry.  Denies heartburn.  No prob swallowing.  Notes qhs dyspnea and cough.  Notes dyspnea on exertion.  No chest xray has been done. No f/c/s >>Add Augmentin and cyclical cough regimen.   08/02/2012 Follow up of Sarcoid /cough  Returns for follow up for Sarcoid and brochitis/sinusitis flare  She has now finished 10 days of Levaquin and Aumgentin  Along with steroid burst w/ cyclical cough regimen Now back on Prednisone 10mg  daily  She is feeling some better with decreased cough and congestion. Still has some intermittent nasal congestion. That is mostly clear. Has some yellow mucus, first think in am,  but seems to clear with  saline rinses. Denies any hemoptysis, orthopnea, PND, leg swelling, or fever Has returned back to Chlor-Trimeton and is using Mucinex Tessalon and Hycodan for cough control      Review of Systems Constitutional:   No  weight loss, night sweats,  Fevers, chills, fatigue, or  lassitude.  HEENT:   No headaches,  Difficulty swallowing,  Tooth/dental problems, or  Sore throat,                No sneezing, itching, ear ache,  +nasal congestion, post nasal drip,   CV:  No chest pain,  Orthopnea, PND, swelling in lower extremities, anasarca, dizziness, palpitations, syncope.   GI  No heartburn, indigestion, abdominal pain, nausea, vomiting, diarrhea, change in bowel habits, loss of appetite, bloody stools.   Resp:  ,  No coughing up of blood.   [name]  No chest wall deformity  Skin: no rash or lesions.  GU: no dysuria, change in color of urine, no urgency or frequency.  No flank  pain, no hematuria   MS:  No joint pain or swelling.  No decreased range of motion.  No back pain.  Psych:  No change in mood or affect. No depression or anxiety.  No memory loss.    '    Objective:   Physical Exam GEN: A/Ox3; pleasant , NAD   HEENT:  [place]/AT,  EACs-clear, TMs-wnl, NOSE-clear drainage THROAT-clear, no lesions, no postnasal drip or exudate noted.   NECK:  Supple w/ fair ROM; no JVD; normal carotid impulses w/o bruits; no thyromegaly or nodules palpated; no lymphadenopathy.  RESP  Coarse BS  w/o, wheezes/ rales/ or rhonchi.no accessory muscle use, no dullness to percussion  CARD:  RRR, no m/r/g  , no peripheral edema, pulses intact, no cyanosis or clubbing.  GI:   Soft & nt; nml bowel sounds; no organomegaly or masses detected.  Musco: Warm bil, no deformities or joint swelling noted.   Neuro: alert, no focal deficits noted.    Skin: Warm, no lesions or rashes         Assessment & Plan:

## 2012-08-02 NOTE — Assessment & Plan Note (Signed)
Recent exacerbation, with associated bronchitis, and sinusitis, now resolving. Patient is to continue on her current regimen at prednisone 10 mg daily along with cough control regimen with Tessalon. Mucinex DM and Hycodan as needed She does continue on Chlor-Trimeton as directed Follow back up with Dr. Delford Field in 2-3 months and as needed

## 2012-08-15 DIAGNOSIS — M25569 Pain in unspecified knee: Secondary | ICD-10-CM | POA: Diagnosis not present

## 2012-08-16 ENCOUNTER — Ambulatory Visit (INDEPENDENT_AMBULATORY_CARE_PROVIDER_SITE_OTHER): Payer: BC Managed Care – PPO | Admitting: Internal Medicine

## 2012-08-16 ENCOUNTER — Encounter: Payer: Self-pay | Admitting: Internal Medicine

## 2012-08-16 ENCOUNTER — Other Ambulatory Visit: Payer: Self-pay | Admitting: Internal Medicine

## 2012-08-16 VITALS — BP 135/70 | HR 76 | Temp 98.3°F | Wt 262.0 lb

## 2012-08-16 DIAGNOSIS — R059 Cough, unspecified: Secondary | ICD-10-CM | POA: Diagnosis not present

## 2012-08-16 DIAGNOSIS — E785 Hyperlipidemia, unspecified: Secondary | ICD-10-CM

## 2012-08-16 DIAGNOSIS — R05 Cough: Secondary | ICD-10-CM | POA: Diagnosis not present

## 2012-08-16 DIAGNOSIS — E119 Type 2 diabetes mellitus without complications: Secondary | ICD-10-CM

## 2012-08-16 DIAGNOSIS — I10 Essential (primary) hypertension: Secondary | ICD-10-CM

## 2012-08-16 DIAGNOSIS — R053 Chronic cough: Secondary | ICD-10-CM

## 2012-08-16 LAB — HM DIABETES FOOT EXAM

## 2012-08-16 LAB — LIPID PANEL
Cholesterol: 149 mg/dL (ref 0–200)
HDL: 36.4 mg/dL — ABNORMAL LOW (ref >39.00)
Total CHOL/HDL Ratio: 4
Triglycerides: 422 mg/dL — ABNORMAL HIGH (ref 0.0–149.0)
VLDL: 84.4 mg/dL — ABNORMAL HIGH (ref 0.0–40.0)

## 2012-08-16 LAB — LDL CHOLESTEROL, DIRECT: Direct LDL: 69.2 mg/dL

## 2012-08-16 LAB — HEMOGLOBIN A1C: Hgb A1c MFr Bld: 8 % — ABNORMAL HIGH (ref 4.6–6.5)

## 2012-08-16 NOTE — Assessment & Plan Note (Signed)
Home cbgs sound reasonably well controlled ocheck a1c todayn insulin

## 2012-08-16 NOTE — Assessment & Plan Note (Signed)
Likely due to sarcoid- followed by pulmonary

## 2012-08-16 NOTE — Progress Notes (Signed)
Patient ID: [name], female   DOB: 04-13-1961, 51 y.o.   MRN: [id number] Sarcoid-- follwed by Kendell Bane DM-- home cbgs- 120 on average Gout- nor recurrence htn-- no home bps Lipids- tolerating meds Weight-- she is not following a diet or exercise plan  Past Medical History  Diagnosis Date  . Sarcoidosis 04/10/2007  . DIABETES MELLITUS, TYPE II 08/21/2006  . HYPERLIPIDEMIA 08/21/2006  . GOUT 08/20/2010  . OBESITY 06/04/2009  . ANEMIA-UNSPECIFIED 09/18/2009  . HYPERTENSION 08/21/2006  . GERD 08/21/2006  . Shortness of breath   . SLEEP APNEA 04/21/2009    last testing was negative    History   Social History  . Marital Status: Married    Spouse Name: N/A    Number of Children: N/A  . Years of Education: N/A   Occupational History  . DISABLED    Social History Main Topics  . Smoking status: Never Smoker   . Smokeless tobacco: Never Used  . Alcohol Use: No  . Drug Use: No  . Sexually Active: Yes   Other Topics Concern  . Not on file   Social History Narrative  . No narrative on file    Past Surgical History  Procedure Date  . Ventral hernia repair   . Nissen fundoplication   . Cholecystectomy   . Abdominal hysterectomy   . Knee arthroscopy     right  . Tubal ligation   . Bladder suspension 11/11/2011    Procedure: TRANSVAGINAL TAPE (TVT) PROCEDURE;  Surgeon: Levi Aland, MD;  Location: [facility];  Service: Gynecology;  Laterality: N/A;  . Cystoscopy 11/11/2011    Procedure: CYSTOSCOPY;  Surgeon: Levi Aland, MD;  Location: [facility];  Service: Gynecology;  Laterality: N/A;    Family History  Problem Relation Age of Onset  . Diabetes Father   . Heart attack Father   . Coronary artery disease Father   . Heart failure Father   . COPD Mother   . Emphysema Mother   . Asthma Mother   . Heart failure Mother   . Sarcoidosis Maternal Uncle   . Colon cancer Neg Hx   . Lung cancer Brother   . Cancer Brother     Allergies  Allergen Reactions  .  Lisinopril Cough  . Methotrexate     REACTION: peri-oral and buccal lesions.    Current Outpatient Prescriptions on File Prior to Visit  Medication Sig Dispense Refill  . albuterol (PROVENTIL HFA;VENTOLIN HFA) 108 (90 BASE) MCG/ACT inhaler Inhale 2 puffs into the lungs every 6 (six) hours as needed. For wheezing.      [name] albuterol (PROVENTIL) (5 MG/ML) 0.5% nebulizer solution Take 2.5 mg by nebulization every 6 (six) hours as needed. For wheezing.      [name] allopurinol (ZYLOPRIM) 100 MG tablet Take 100 mg by mouth daily.      [name] aspirin EC 81 MG tablet Take 81 mg by mouth daily.      [name] atenolol (TENORMIN) 50 MG tablet Take 50 mg by mouth daily.      [name] atorvastatin (LIPITOR) 40 MG tablet TAKE ONE TABLET BY MOUTH ONE TIME DAILY  90 tablet  0  . benzonatate (TESSALON) 100 MG capsule Take 1 capsule (100 mg total) by mouth every 6 (six) hours as needed for cough.  60 capsule  4  . buPROPion (WELLBUTRIN XL) 300 MG 24 hr tablet Take 1 tablet (300 mg total) by mouth daily.  30 tablet  4  . Chlorpheniramine  Tannate 12 MG TABS HOLD for two weeks  30 each  6  . chlorpheniramine-HYDROcodone (TUSSIONEX) 10-8 MG/5ML LQCR Take 10 mLs by mouth every 12 (twelve) hours as needed. For cough.  240 mL  0  . cholecalciferol (VITAMIN D) 1000 UNITS tablet Take 1,000 Units by mouth 2 (two) times daily.      . ciclesonide (OMNARIS) 50 MCG/ACT nasal spray Place 1 spray into both nostrils daily.       . cloNIDine (CATAPRES) 0.1 MG tablet Take 0.1 mg by mouth daily.       [name] dexlansoprazole (DEXILANT) 60 MG capsule Take 60 mg by mouth daily.      [name] estradiol (ESTRACE) 2 MG tablet Take 2 mg by mouth daily.      . fenofibrate (TRICOR) 145 MG tablet Take 145 mg by mouth daily.        . furosemide (LASIX) 20 MG tablet Take 1 tablet (20 mg total) by mouth 2 (two) times daily.  30 tablet  4  . glyBURIDE (DIABETA) 2.5 MG tablet Take 1 tablet (2.5 mg total) by mouth daily with breakfast.  30 tablet  5  . HYDROcodone-homatropine  (HYCODAN) 5-1.5 MG/5ML syrup As needed      . insulin glargine (LANTUS SOLOSTAR) 100 UNIT/ML injection Inject 20 Units into the skin at bedtime.  1 pen  3  . Insulin Pen Needle 31G X 8 MM MISC 1 each by Does not apply route daily.  30 each  3  . LORazepam (ATIVAN) 1 MG tablet Take 1 mg by mouth every 8 (eight) hours as needed. For anxiety.      [name] losartan (COZAAR) 50 MG tablet Take 50 mg by mouth at bedtime.      . metFORMIN (GLUCOPHAGE) 1000 MG tablet Take 1 tablet (1,000 mg total) by mouth 2 (two) times daily with a meal.  180 tablet  3  . mometasone (NASONEX) 50 MCG/ACT nasal spray Place 2 sprays into the nose daily.  17 g  6  . Mometasone Furo-Formoterol Fum (DULERA) 200-5 MCG/ACT AERO Inhale 2 puffs into the lungs 2 (two) times daily.  8.8 g  1  . nitroGLYCERIN (NITROSTAT) 0.4 MG SL tablet Place 0.4 mg under the tongue every 5 (five) minutes as needed. For chest pain.      . potassium chloride SA (K-DUR,KLOR-CON) 20 MEQ tablet Take 20 mEq by mouth 3 (three) times daily.      . predniSONE (DELTASONE) 10 MG tablet Take 10 mg by mouth daily.      [name] venlafaxine XR (EFFEXOR-XR) 75 MG 24 hr capsule Take 75 mg by mouth 2 (two) times daily.         patient denies chest pain, shortness of breath, orthopnea. Denies lower extremity edema, abdominal pain, change in appetite, change in bowel movements. Patient denies rashes, musculoskeletal complaints. No other specific complaints in a complete review of systems.   BP 152/82  Pulse 76  Temp 98.3 F (36.8 C) (Oral)  Wt 262 lb (118.842 kg)  Well-developed well-nourished female in no acute distress. HEENT exam atraumatic, normocephalic, extraocular muscles are intact. Neck is supple. No jugular venous distention no thyromegaly. Chest clear to auscultation without increased work of breathing. Cardiac exam S1 and S2 are regular. Abdominal exam active bowel sounds, soft, nontender. Extremities no edema. Neurologic exam she is alert without any motor sensory  deficits. Gait is normal.

## 2012-08-16 NOTE — Assessment & Plan Note (Signed)
Liver profile ok Check lipid panel today

## 2012-08-16 NOTE — Assessment & Plan Note (Signed)
Repeat bp is better Continue same meds 

## 2012-08-18 ENCOUNTER — Telehealth: Payer: Self-pay | Admitting: Internal Medicine

## 2012-08-18 NOTE — Telephone Encounter (Signed)
Pt needs blood work results °

## 2012-08-21 NOTE — Telephone Encounter (Signed)
Labs are ok Remind her she can look at them in [place]

## 2012-08-21 NOTE — Telephone Encounter (Signed)
Pt called to get lab results. Pt has to have results before Wednesday 08/23/12. Pls call asap.

## 2012-08-21 NOTE — Telephone Encounter (Signed)
Pt informed

## 2012-09-18 ENCOUNTER — Encounter: Payer: Self-pay | Admitting: Family

## 2012-09-18 ENCOUNTER — Ambulatory Visit (INDEPENDENT_AMBULATORY_CARE_PROVIDER_SITE_OTHER): Payer: BC Managed Care – PPO | Admitting: Family

## 2012-09-18 VITALS — BP 120/84 | HR 98 | Temp 98.9°F | Wt 259.0 lb

## 2012-09-18 DIAGNOSIS — J99 Respiratory disorders in diseases classified elsewhere: Secondary | ICD-10-CM | POA: Diagnosis not present

## 2012-09-18 DIAGNOSIS — D869 Sarcoidosis, unspecified: Secondary | ICD-10-CM | POA: Diagnosis not present

## 2012-09-18 DIAGNOSIS — R059 Cough, unspecified: Secondary | ICD-10-CM | POA: Diagnosis not present

## 2012-09-18 DIAGNOSIS — D860 Sarcoidosis of lung: Secondary | ICD-10-CM

## 2012-09-18 DIAGNOSIS — R05 Cough: Secondary | ICD-10-CM | POA: Diagnosis not present

## 2012-09-18 DIAGNOSIS — J209 Acute bronchitis, unspecified: Secondary | ICD-10-CM

## 2012-09-18 MED ORDER — METHYLPREDNISOLONE ACETATE 40 MG/ML IJ SUSP
80.0000 mg | Freq: Once | INTRAMUSCULAR | Status: AC
  Administered 2012-09-18: 80 mg via INTRAMUSCULAR

## 2012-09-18 MED ORDER — METHYLPREDNISOLONE 4 MG PO KIT: PACK | ORAL | Status: AC

## 2012-09-18 NOTE — Progress Notes (Signed)
Subjective:    Patient ID: [name], female    DOB: Apr 20, 1961, 51 y.o.   MRN: [id number]  HPI 51 year old white female, nonsmoker, patient of Dr. Cato Mulligan is in today with complaints of shortness of breath, cough, chest congestion x3 days and worsening. His intake and Tussionex, Cepacol and Mucinex with no relief. Her pulmonologist had put her on erythromycin every other day but she ran out of several weeks ago. She's unsure if it has helped.  She has a history of type 2 diabetes. Reports fasting blood sugars 120 to 130s. Postprandial readings 140s to 150s. Denies any blurred vision, frequency urgency.  Review of Systems  Constitutional: Negative.   HENT: Negative.   Eyes: Negative.   Respiratory: Positive for cough, shortness of breath and wheezing.   Cardiovascular: Negative.   Musculoskeletal: Negative.   Skin: Negative.   Hematological: Negative.   Psychiatric/Behavioral: Negative.    Past Medical History  Diagnosis Date  . Sarcoidosis 04/10/2007  . DIABETES MELLITUS, TYPE II 08/21/2006  . HYPERLIPIDEMIA 08/21/2006  . GOUT 08/20/2010  . OBESITY 06/04/2009  . ANEMIA-UNSPECIFIED 09/18/2009  . HYPERTENSION 08/21/2006  . GERD 08/21/2006  . Shortness of breath   . SLEEP APNEA 04/21/2009    last testing was negative    History   Social History  . Marital Status: Married    Spouse Name: N/A    Number of Children: N/A  . Years of Education: N/A   Occupational History  . DISABLED    Social History Main Topics  . Smoking status: Never Smoker   . Smokeless tobacco: Never Used  . Alcohol Use: No  . Drug Use: No  . Sexually Active: Yes   Other Topics Concern  . Not on file   Social History Narrative  . No narrative on file    Past Surgical History  Procedure Date  . Ventral hernia repair   . Nissen fundoplication   . Cholecystectomy   . Abdominal hysterectomy   . Knee arthroscopy     right  . Tubal ligation   . Bladder suspension 11/11/2011     Procedure: TRANSVAGINAL TAPE (TVT) PROCEDURE;  Surgeon: Levi Aland, MD;  Location: [facility];  Service: Gynecology;  Laterality: N/A;  . Cystoscopy 11/11/2011    Procedure: CYSTOSCOPY;  Surgeon: Levi Aland, MD;  Location: [facility];  Service: Gynecology;  Laterality: N/A;    Family History  Problem Relation Age of Onset  . Diabetes Father   . Heart attack Father   . Coronary artery disease Father   . Heart failure Father   . COPD Mother   . Emphysema Mother   . Asthma Mother   . Heart failure Mother   . Sarcoidosis Maternal Uncle   . Colon cancer Neg Hx   . Lung cancer Brother   . Cancer Brother     Allergies  Allergen Reactions  . Lisinopril Cough  . Methotrexate     REACTION: peri-oral and buccal lesions.    Current Outpatient Prescriptions on File Prior to Visit  Medication Sig Dispense Refill  . albuterol (PROVENTIL HFA;VENTOLIN HFA) 108 (90 BASE) MCG/ACT inhaler Inhale 2 puffs into the lungs every 6 (six) hours as needed. For wheezing.      [name] albuterol (PROVENTIL) (5 MG/ML) 0.5% nebulizer solution Take 2.5 mg by nebulization every 6 (six) hours as needed. For wheezing.      [name] allopurinol (ZYLOPRIM) 100 MG tablet Take 100 mg by mouth daily.      [name]  aspirin EC 81 MG tablet Take 81 mg by mouth daily.      [name] atenolol (TENORMIN) 50 MG tablet Take 50 mg by mouth daily.      [name] atorvastatin (LIPITOR) 40 MG tablet TAKE ONE TABLET BY MOUTH ONE TIME DAILY  90 tablet  0  . azithromycin (ZITHROMAX) 250 MG tablet Take 250 mg by mouth 3 (three) times a week. Prescribed by [facility]      . benzonatate (TESSALON) 100 MG capsule Take 1 capsule (100 mg total) by mouth every 6 (six) hours as needed for cough.  60 capsule  4  . buPROPion (WELLBUTRIN XL) 300 MG 24 hr tablet Take 1 tablet (300 mg total) by mouth daily.  30 tablet  4  . Chlorpheniramine Tannate 12 MG TABS HOLD for two weeks  30 each  6  . chlorpheniramine-HYDROcodone (TUSSIONEX) 10-8 MG/5ML LQCR Take 10 mLs by mouth every 12  (twelve) hours as needed. For cough.  240 mL  0  . cholecalciferol (VITAMIN D) 1000 UNITS tablet Take 1,000 Units by mouth 2 (two) times daily.      . ciclesonide (OMNARIS) 50 MCG/ACT nasal spray Place 1 spray into both nostrils daily.       . cloNIDine (CATAPRES) 0.1 MG tablet Take 0.1 mg by mouth daily.       [name] dexlansoprazole (DEXILANT) 60 MG capsule Take 60 mg by mouth daily.      [name] estradiol (ESTRACE) 2 MG tablet Take 2 mg by mouth daily.      . fenofibrate (TRICOR) 145 MG tablet Take 145 mg by mouth daily.        . furosemide (LASIX) 20 MG tablet Take 1 tablet (20 mg total) by mouth 2 (two) times daily.  30 tablet  4  . glyBURIDE (DIABETA) 2.5 MG tablet Take 1 tablet (2.5 mg total) by mouth daily with breakfast.  30 tablet  5  . HYDROcodone-homatropine (HYCODAN) 5-1.5 MG/5ML syrup As needed      . insulin glargine (LANTUS SOLOSTAR) 100 UNIT/ML injection Inject 20 Units into the skin at bedtime.  1 pen  3  . Insulin Pen Needle 31G X 8 MM MISC 1 each by Does not apply route daily.  30 each  3  . LORazepam (ATIVAN) 1 MG tablet Take 1 mg by mouth every 8 (eight) hours as needed. For anxiety.      [name] losartan (COZAAR) 50 MG tablet Take 50 mg by mouth at bedtime.      . metFORMIN (GLUCOPHAGE) 1000 MG tablet Take 1 tablet (1,000 mg total) by mouth 2 (two) times daily with a meal.  180 tablet  3  . mometasone (NASONEX) 50 MCG/ACT nasal spray Place 2 sprays into the nose daily.  17 g  6  . Mometasone Furo-Formoterol Fum (DULERA) 200-5 MCG/ACT AERO Inhale 2 puffs into the lungs 2 (two) times daily.  8.8 g  1  . nitroGLYCERIN (NITROSTAT) 0.4 MG SL tablet Place 0.4 mg under the tongue every 5 (five) minutes as needed. For chest pain.      . potassium chloride SA (K-DUR,KLOR-CON) 20 MEQ tablet Take 20 mEq by mouth 3 (three) times daily.      . predniSONE (DELTASONE) 10 MG tablet Take 10 mg by mouth daily.      [name] venlafaxine XR (EFFEXOR-XR) 75 MG 24 hr capsule Take 75 mg by mouth 2 (two) times daily.       [name] venlafaxine XR (EFFEXOR-XR) 75 MG 24 hr capsule  TAKE ONE CAPSULE BY MOUTH TWICE DAILY  60 capsule  4   No current facility-administered medications on file prior to visit.    BP 120/84  Pulse 98  Temp 98.9 F (37.2 C) (Oral)  Wt 259 lb (117.482 kg)  SpO2 82%chart    Objective:   Physical Exam  Constitutional: She is oriented to person, place, and time. She appears well-developed and well-nourished.  HENT:  Right Ear: External ear normal.  Left Ear: External ear normal.  Nose: Nose normal.  Mouth/Throat: Oropharynx is clear and moist.  Neck: Normal range of motion. Neck supple.  Cardiovascular: Normal rate, regular rhythm and normal heart sounds.   Pulmonary/Chest: Effort normal. She has wheezes.  Abdominal: Soft.  Neurological: She is alert and oriented to person, place, and time.  Skin: Skin is warm and dry.     Depo-Medrol 80 mg IM x1        Assessment & Plan:  Assessment: Bronchitis, Cough, Sarcoidosis, Type 2 diabetes  Plan: Medrol Dosepak as directed. Continue delay her Proventil as needed. Check blood sugars 3 times a day. Patient call the office if symptoms worsen or persist. Recheck as scheduled, and when necessary.

## 2012-09-18 NOTE — Patient Instructions (Addendum)
`Sarcoidosis, Schaumann's Disease, Sarcoid of [place]eck Sarcoidosis appears briefly and heals naturally in 60 to 70 percent of cases, often without the patient knowing or doing anything about it. 20 to 30 percent of patients with sarcoidosis are left with some permanent lung damage. In 10 to 15 percent of the patients, sarcoidosis can become chronic (long lasting). When either the granulomas or fibrosis seriously affect the function of a vital organ (lungs, heart, nervous system, liver, or kidneys), sarcoidosis can be fatal. This occurs 5 to 10 percent of the time. No one can predict how sarcoidosis will progress in an individual patient. The symptoms the patient experiences, the caregiver's findings, and the patient's race can give some clues. Sarcoidosis was once considered a rare disease. We now know that it is a common chronic illness that appears all over the world. It is the most common of the fibrotic (scarring) lung disorders. Anyone can get sarcoidosis. It occurs in all races and in both sexes. The risk is greater if you are a young black adult, especially a black woman, or are of Scandinavian, German, Irish, or Puerto Rican origin. In sarcoidosis, small lumps (also called nodules or granulomas) develop in multiple organs of the body. These granulomas are small collections of inflamed cells. They commonly appear in the lungs. This is the most common organ affected. They also occur in the lymph nodes (your glands), skin, liver, and eyes. The granulomas vary in the amount of disease they produce from very little with no problems (symptoms) to causing severe illness. The cause of sarcoidosis is not known. It may be due to an abnormal immune reaction in the body. Most people will recover. A few people will develop long lasting conditions that may get worse. Women are affected more often than men. The majority of those affected are under forty years of age. Because we do not know the cause, we do not have ways to  prevent it. SYMPTOMS   Fever.  Loss of appetite.  Night sweats.  Joint pain.  Aching muscles Symptoms vary because the disease affects different parts of the body in different people. Most people who see their caregiver with sarcoidosis have lung problems. The first signs are usually a dry cough and shortness of breath. There may also be wheezing, chest pain, or a cough that brings up bloody mucus. In severe cases, lung function may become so poor that the person cannot perform even the simple routine tasks of daily life. Other symptoms of sarcoidosis are less common than lung symptoms. They can include:  Skin symptoms. Sarcoidosis can appear as a collection of tender, red bumps called erythema nodosum. These bumps usually occur on the face, shins, and arms. They can also occur as a scaly, purplish discoloration on the nose, cheeks, and ears. This is called lupus pernio. Less often, sarcoidosis causes cysts, pimples, or disfiguring over growths of skin. In many cases, the disfiguring over growths develop in areas of scars or tattoos.  Eye symptoms. These include redness, eye pain, and sensitivity to light.  Heart symptoms. These include irregular heartbeat and heart failure.  Other symptoms. A person may have paralyzed facial muscles, seizures, psychiatric symptoms, swollen salivary glands, or bone pain. DIAGNOSIS  Even when there are no symptoms, your caregiver can sometimes pick up signs of sarcoidosis during a routine examination, usually through a chest x-ray or when checking other complaints. The patient's age and race or ethnic group can raise an additional red flag that a sign or symptom could   be related to sarcoidosis.   Enlargement of the salivary or tear glands and cysts in bone tissue may also be caused by sarcoidosis.  You may have had a biopsy done that shows signs of sarcoidosis. A biopsy is a small tissue sample that is removed for laboratory testing. This tissue sample can  be taken from your lung, skin, lip, or another inflamed or abnormal area of the body.  You may have had an abnormal chest X-ray. Although you appear healthy, a chest X-ray ordered for other reasons may turn up abnormalities that suggest sarcoidosis.  Other tests may be needed. These tests may be done to rule out other illnesses or to determine the amount of organ damage caused by sarcoidosis. Some of the most common tests are:  Blood levels of calcium or angiotensin-converting enzyme may be high in people with sarcoidosis.  Blood tests to evaluate how well your liver is functioning.  Lung function tests to measure how well you are breathing.  A complete eye examination. TREATMENT  If sarcoidosis does not cause any problems, treatment may not be necessary. Your caregiver may decide to simply monitor your condition. As part of this monitoring process, you may have frequent office visits, follow-up chest X-rays, and tests of your lung function.If you have signs of moderate or severe lung disease, your doctor may recommend:  A corticosteroid drug, such as prednisone (sold under several brand names).  Corticosteroids also are used to treat sarcoidosis of the eyes, joints, skin, nerves, or heart.  Corticosteroid eye drops may be used for the eyes.  Over-the-counter medications like nonsteroidal anti-inflammatory drugs (NSAID) often are used to treat joint pain first before corticosteroids, which tend to have more side effects.  If corticosteroids are not effective or cause serious side effects, other drugs that alter or suppress the immune system may be used.  In rare cases, when sarcoidosis causes life-threatening lung disease, a lung transplant may be necessary. However, there is some risk that the new lungs also will be attacked by sarcoidosis. SEEK IMMEDIATE MEDICAL CARE IF:   You suffer from shortness of breath or a lingering cough.  You develop new problems that may be related to the  disease. Remember this disease can affect almost all organs of the body and cause many different problems. Document Released: 07/28/2004 Document Revised: 12/20/2011 Document Reviewed: 01/05/2006 Exit[employer] Patient Information 2013 [employer].  

## 2012-09-27 ENCOUNTER — Ambulatory Visit (INDEPENDENT_AMBULATORY_CARE_PROVIDER_SITE_OTHER): Payer: BC Managed Care – PPO | Admitting: Internal Medicine

## 2012-09-27 ENCOUNTER — Encounter: Payer: Self-pay | Admitting: Internal Medicine

## 2012-09-27 VITALS — BP 120/82 | HR 76 | Ht 66.0 in | Wt 252.0 lb

## 2012-09-27 DIAGNOSIS — R1310 Dysphagia, unspecified: Secondary | ICD-10-CM | POA: Diagnosis not present

## 2012-09-27 DIAGNOSIS — K219 Gastro-esophageal reflux disease without esophagitis: Secondary | ICD-10-CM

## 2012-09-27 DIAGNOSIS — E119 Type 2 diabetes mellitus without complications: Secondary | ICD-10-CM

## 2012-09-27 MED ORDER — OMEPRAZOLE 20 MG PO CPDR: 20.0000 mg | DELAYED_RELEASE_CAPSULE | Freq: Every day | ORAL | Status: DC

## 2012-09-27 NOTE — Patient Instructions (Addendum)
You have been scheduled for an endoscopy with propofol. Please follow written instructions given to you at your visit today. If you use inhalers (even only as needed) or a CPAP machine, please bring them with you on the day of your procedure.  You have been given some samples of Prilosec - take one a day along with your Dexilant

## 2012-09-27 NOTE — Progress Notes (Signed)
HISTORY OF PRESENT ILLNESS:  [name] is a 51 y.o. female with multiple significant medical problems who has been followed in this office for GERD and nonacute GI complaints. The patient is status post prior fundoplication. She is maintained on Dexilant 60 mg daily for GERD. Her chief complaint today is that of near daily esophageal burning 1 months duration. This despite PPI. Can't assess did not help. She also reports intermittent dysphagia to pills. No dysphagia to foods. No obvious odynophagia. No other complaints. She is accompanied by her husband. Last upper endoscopy November of 2011 was normal post fundoplication. Examination was being done for noncardiac chest pain. Chest pain was felt to be non-GI as well. Complete colonoscopy in July 2011 was normal except for mild melanosis. Also hemorrhoids. Chronic medical problems are stable  REVIEW OF SYSTEMS:  All non-GI ROS negative except for cough, arthritis  Past Medical History  Diagnosis Date  . Sarcoidosis 04/10/2007  . DIABETES MELLITUS, TYPE II 08/21/2006  . HYPERLIPIDEMIA 08/21/2006  . GOUT 08/20/2010  . OBESITY 06/04/2009  . ANEMIA-UNSPECIFIED 09/18/2009  . HYPERTENSION 08/21/2006  . GERD 08/21/2006  . Shortness of breath   . SLEEP APNEA 04/21/2009    last testing was negative  . Internal hemorrhoids     Past Surgical History  Procedure Date  . Ventral hernia repair   . Nissen fundoplication   . Cholecystectomy   . Abdominal hysterectomy   . Knee arthroscopy     right  . Tubal ligation   . Bladder suspension 11/11/2011    Procedure: TRANSVAGINAL TAPE (TVT) PROCEDURE;  Surgeon: Levi Aland, MD;  Location: [facility];  Service: Gynecology;  Laterality: N/A;  . Cystoscopy 11/11/2011    Procedure: CYSTOSCOPY;  Surgeon: Levi Aland, MD;  Location: [facility];  Service: Gynecology;  Laterality: N/A;    Social History [name]  reports that she has never smoked. She has never used smokeless tobacco. She reports  that she does not drink alcohol or use illicit drugs.  family history includes Asthma in her mother; COPD in her mother; Cancer in her brother; Coronary artery disease in her father; Diabetes in her father; Emphysema in her mother; Heart attack in her father; Heart failure in her father and mother; Lung cancer in her brother; and Sarcoidosis in her maternal uncle.  There is no history of Colon cancer.  Allergies  Allergen Reactions  . Lisinopril Cough  . Methotrexate     REACTION: peri-oral and buccal lesions.       PHYSICAL EXAMINATION: Vital signs: BP 120/82  Pulse 76  Ht 5\' 6"  (1.676 m)  Wt 252 lb (114.306 kg)  BMI 40.67 kg/m2 General: Well-developed, obese,well-nourished, no acute distress HEENT: Sclerae are anicteric, conjunctiva pink. Oral mucosa intact. No thrush Lungs: Clear Heart: Regular Abdomen: soft, obese,nontender, nondistended, no obvious ascites, no peritoneal signs, normal bowel sounds. No organomegaly. Extremities: No edema Psychiatric: alert and oriented x3. Cooperative     ASSESSMENT:  #1. Esophageal burning in pill dysphagia. This is a patient with known GERD. Symptoms despite PPI. On immunosuppressive therapy including prednisone. Rule out stricture, infectious esophagitis, slipped fundoplication #2. Multiple medical problems including insulin requiring diabetes   PLAN:  #1. EGD with possible esophageal dilation.The nature of the procedure, as well as the risks, benefits, and alternatives were carefully and thoroughly reviewed with the patient. Ample time for discussion and questions allowed. The patient understood, was satisfied, and agreed to proceed. The patient is high-risk given her comorbidities,  pulmonary disease, and obesity. Recommend CRNA supervised propofol administration #2. Hold diabetic medications the day of the exam until she resumes by mouth intake #3. Given multiple samples of omeprazole 20 mg. She should take Dexilant in the morning but  add omeprazole prior to the evening meal. We will see if this helps

## 2012-09-29 ENCOUNTER — Ambulatory Visit: Payer: BC Managed Care – PPO | Admitting: Critical Care Medicine

## 2012-10-06 IMAGING — CR DG KNEE COMPLETE 4+V*L*
4 series · 4 of 4 positions shown · non-contrast
Comparison: Left knee radiographs performed 07/17/2008

CLINICAL DATA: Left knee and proximal lower leg pain.

LEFT KNEE - COMPLETE 4+ VIEW

[t knee ap left *]
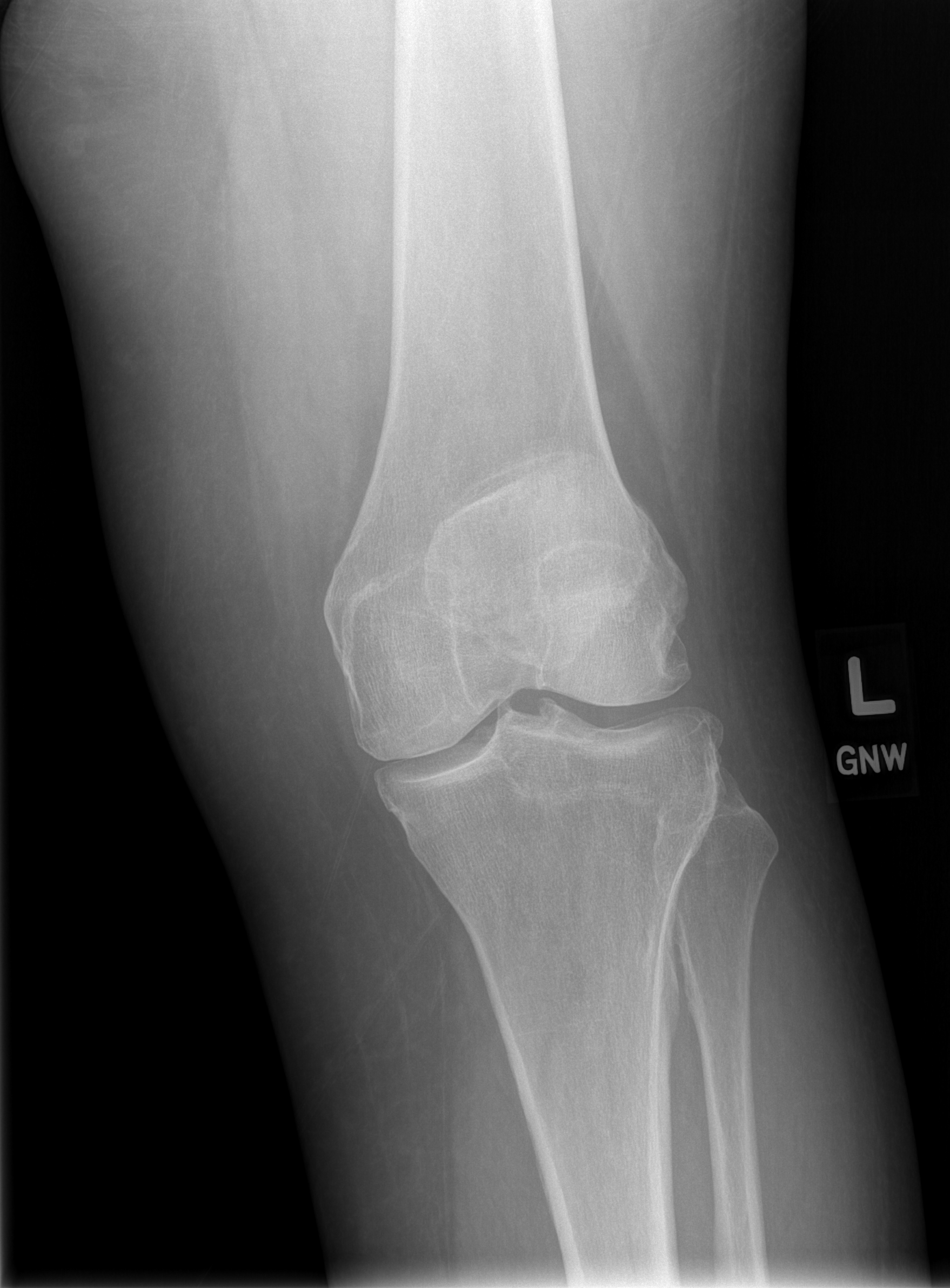

[t knee oblique left * (1 of 2)]
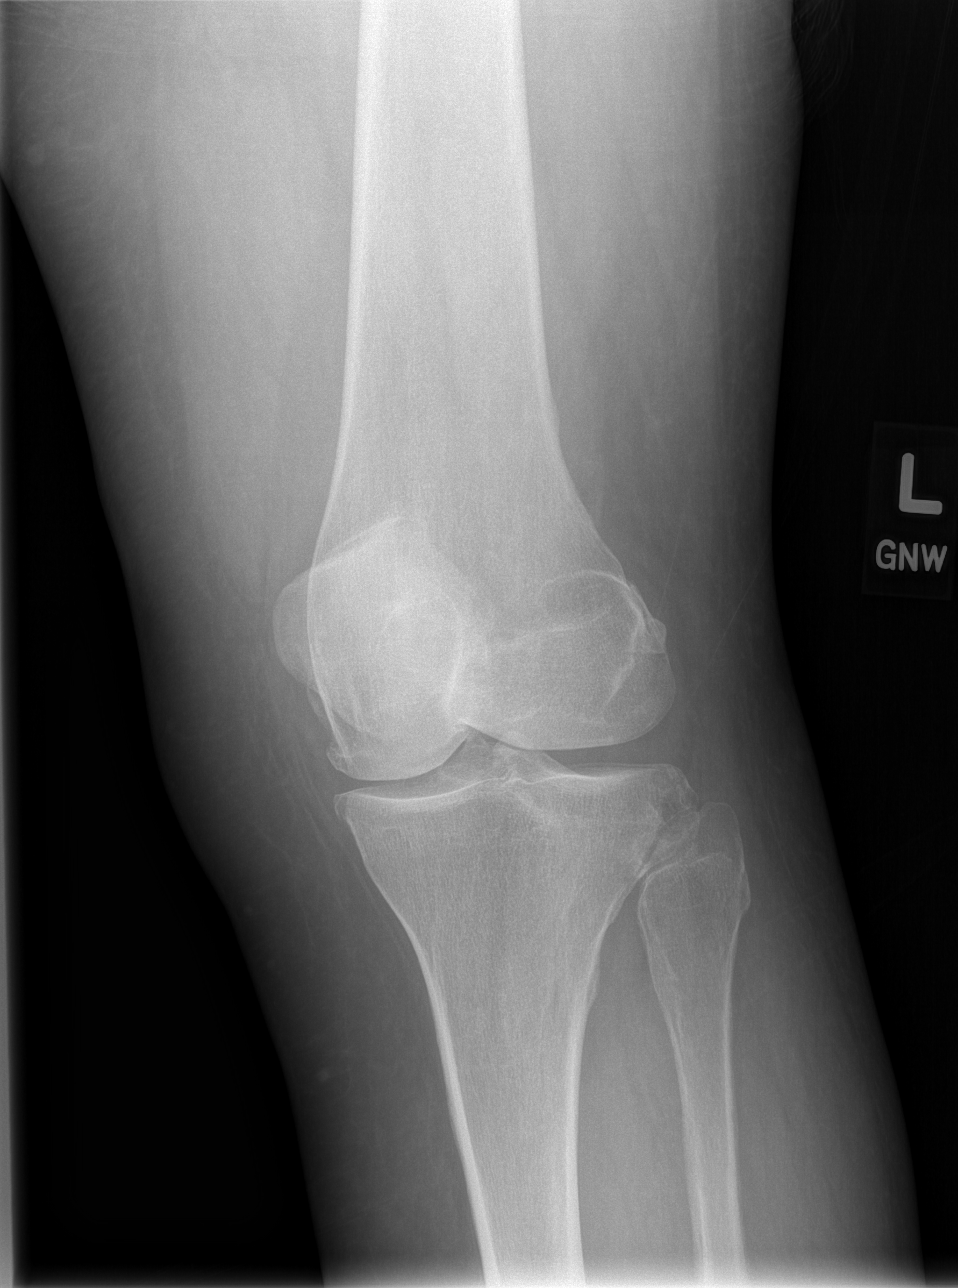

[t knee oblique left * (2 of 2)]
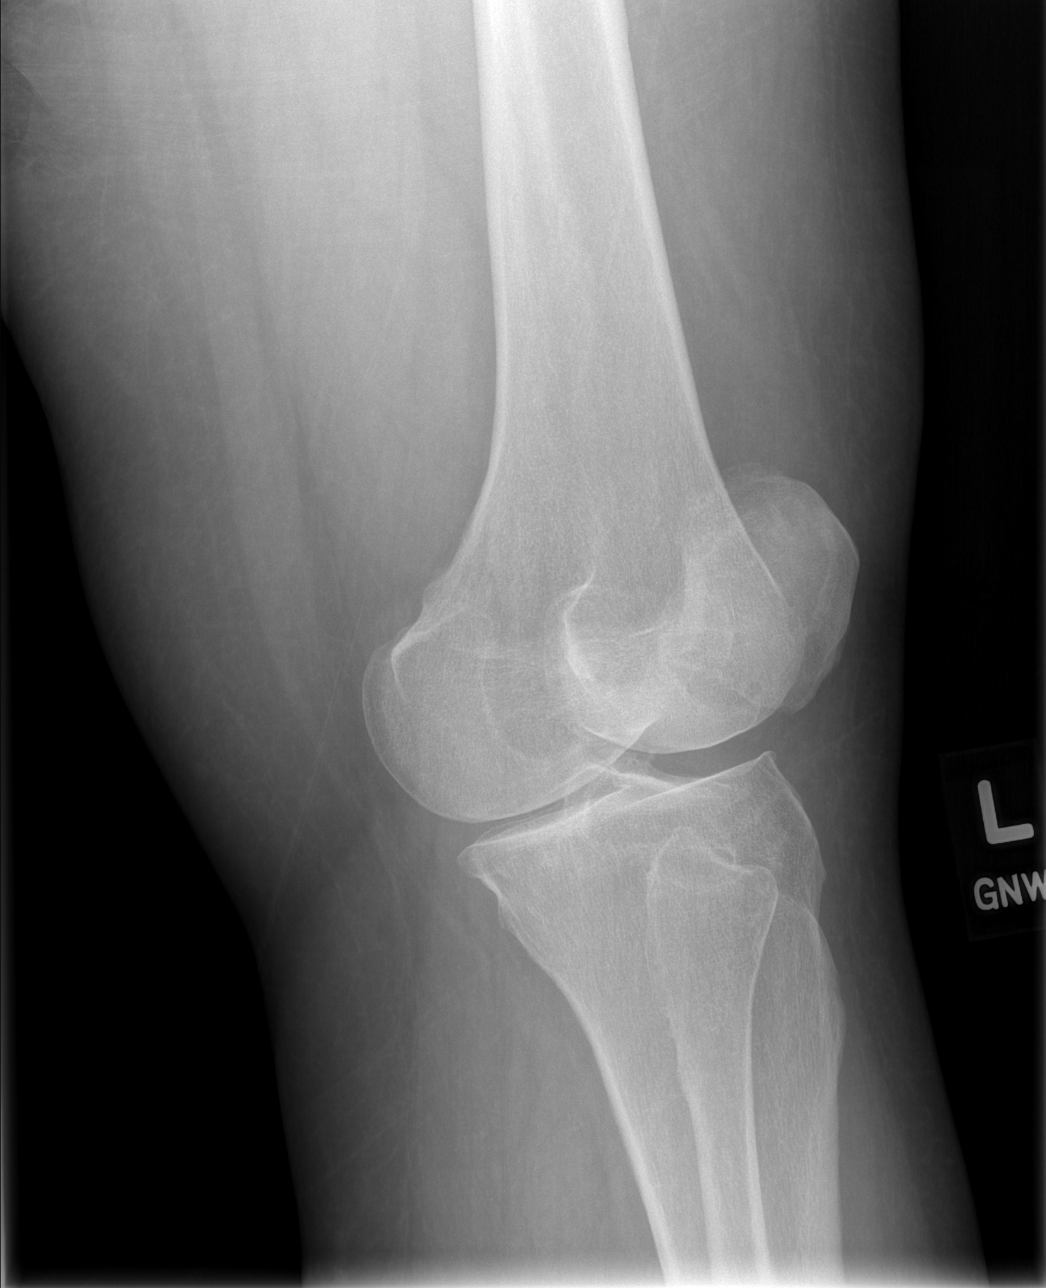

[t knee lat left *]
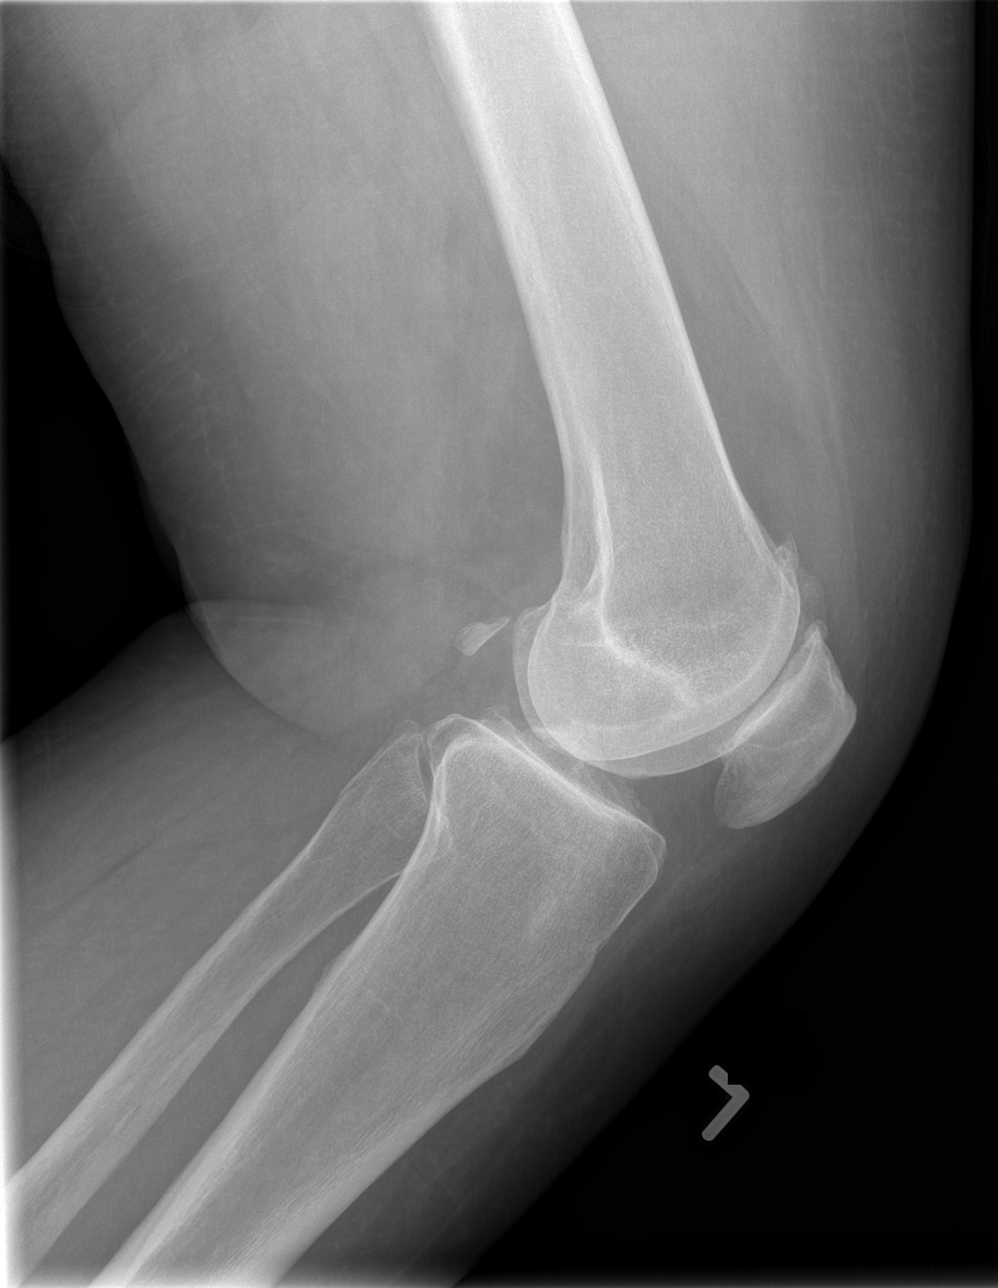

[4 of 4 positions shown; findings below may reference images not displayed]

FINDINGS: There is no evidence of fracture or dislocation.  The
joint spaces are preserved. Mild degenerative change is noted at
the patellofemoral compartment, with mild osteophyte formation seen
superiorly.  A fabella is noted.

A small joint effusion is seen.  The visualized soft tissues are
otherwise unremarkable in appearance.
IMPRESSION: 1.  No evidence of fracture or dislocation.
2.  Mild degenerative change at the patellofemoral compartment.
3.  Small knee joint effusion seen.

## 2012-10-08 ENCOUNTER — Other Ambulatory Visit: Payer: Self-pay | Admitting: Internal Medicine

## 2012-10-10 ENCOUNTER — Other Ambulatory Visit: Payer: Self-pay | Admitting: Internal Medicine

## 2012-10-17 ENCOUNTER — Other Ambulatory Visit: Payer: Self-pay | Admitting: Internal Medicine

## 2012-10-23 DIAGNOSIS — D869 Sarcoidosis, unspecified: Secondary | ICD-10-CM | POA: Diagnosis not present

## 2012-10-23 DIAGNOSIS — K219 Gastro-esophageal reflux disease without esophagitis: Secondary | ICD-10-CM | POA: Diagnosis not present

## 2012-10-26 ENCOUNTER — Encounter: Payer: Self-pay | Admitting: Internal Medicine

## 2012-10-26 ENCOUNTER — Ambulatory Visit (AMBULATORY_SURGERY_CENTER): Payer: BC Managed Care – PPO | Admitting: Internal Medicine

## 2012-10-26 VITALS — BP 148/72 | HR 73 | Temp 97.0°F | Resp 19 | Ht 66.0 in | Wt 252.0 lb

## 2012-10-26 DIAGNOSIS — R1310 Dysphagia, unspecified: Secondary | ICD-10-CM | POA: Diagnosis not present

## 2012-10-26 DIAGNOSIS — G4730 Sleep apnea, unspecified: Secondary | ICD-10-CM | POA: Diagnosis not present

## 2012-10-26 DIAGNOSIS — I10 Essential (primary) hypertension: Secondary | ICD-10-CM | POA: Diagnosis not present

## 2012-10-26 DIAGNOSIS — K219 Gastro-esophageal reflux disease without esophagitis: Secondary | ICD-10-CM

## 2012-10-26 DIAGNOSIS — Z6841 Body Mass Index (BMI) 40.0 and over, adult: Secondary | ICD-10-CM | POA: Diagnosis not present

## 2012-10-26 LAB — GLUCOSE, CAPILLARY
Glucose-Capillary: 108 mg/dL — ABNORMAL HIGH (ref 70–99)
Glucose-Capillary: 121 mg/dL — ABNORMAL HIGH (ref 70–99)

## 2012-10-26 MED ORDER — SODIUM CHLORIDE 0.9 % IV SOLN: 500.0000 mL | INTRAVENOUS | Status: DC

## 2012-10-26 NOTE — Progress Notes (Signed)
Patient did not experience any of the following events: a burn prior to discharge; a fall within the facility; wrong site/side/patient/procedure/implant event; or a hospital transfer or hospital admission upon discharge from the facility. (G8907) Patient did not have preoperative order for IV antibiotic SSI prophylaxis. (G8918)  

## 2012-10-26 NOTE — Progress Notes (Signed)
[id number] a/ox3 pleased with MAC report to April RN

## 2012-10-26 NOTE — Progress Notes (Signed)
Called to room to assist during endoscopic procedure.  Patient ID and intended procedure confirmed with present staff. Received instructions for my participation in the procedure from the performing physician. Entered documentation on dilatation only.

## 2012-10-26 NOTE — Op Note (Signed)
[facility] [address] [place], [address]   ENDOSCOPY PROCEDURE REPORT  PATIENT: [name], [name]  MR#: [id number] BIRTHDATE: 1960/10/31 , 51  yrs. old GENDER: Female ENDOSCOPIST: Roxy Cedar, MD REFERRED BY:  .  Self / Office PROCEDURE DATE:  10/26/2012 PROCEDURE:  EGD, diagnostic and Maloney dilation of esophagus  - 58 F ASA CLASS:     Class III INDICATIONS:  Chest pain (burning despite PPI).   Dysphagia to pills. MEDICATIONS: MAC sedation, administered by CRNA and propofol (Diprivan) 220mg  IV TOPICAL ANESTHETIC: Cetacaine Spray  DESCRIPTION OF PROCEDURE: After the risks benefits and alternatives of the procedure were thoroughly explained, informed consent was obtained.  The LB [id number] endoscope was introduced through the mouth and advanced to the second portion of the duodenum. Without limitations.  The instrument was slowly withdrawn as the mucosa was fully examined.      The upper, middle and distal third of the esophagus were carefully inspected and no abnormalities were noted.  The z-line was well seen at the GEJ.  The endoscope was pushed into the fundus which was normal including a retroflexed view.  The antrum, gastric body, first and second part of the duodenum were unremarkable. Retroflexed views revealed prior fundoplication.     The scope was then withdrawn from the patient and the procedure completed.  THERAPY: 87 F Maloney dilator passed w/o resistance or heme. Tolerated well  COMPLICATIONS: There were no complications. ENDOSCOPIC IMPRESSION: 1. Normal EGD post fundoplication 2. S/P dilation 5F  RECOMMENDATIONS: 1.Continue PPI therapy and reflux precautions  REPEAT EXAM:  eSigned:  Roxy Cedar, MD 10/26/2012 8:50 AM   XB:JYNWG Romilda Garret, MD and The Patient

## 2012-10-26 NOTE — Patient Instructions (Addendum)
YOU HAD AN ENDOSCOPIC PROCEDURE TODAY AT THE [facility]: Refer to the procedure report that was given to you for any specific questions about what was found during the examination.  If the procedure report does not answer your questions, please call your gastroenterologist to clarify.  If you requested that your care partner not be given the details of your procedure findings, then the procedure report has been included in a sealed envelope for you to review at your convenience later.  YOU SHOULD EXPECT: Some feelings of bloating in the abdomen. Passage of more gas than usual.  Walking can help get rid of the air that was put into your GI tract during the procedure and reduce the bloating. If you had a lower endoscopy (such as a colonoscopy or flexible sigmoidoscopy) you may notice spotting of blood in your stool or on the toilet paper. If you underwent a bowel prep for your procedure, then you may not have a normal bowel movement for a few days.  DIET: see dilation handout   ACTIVITY: Your care partner should take you home directly after the procedure.  You should plan to take it easy, moving slowly for the rest of the day.  You can resume normal activity the day after the procedure however you should NOT DRIVE or use heavy machinery for 24 hours (because of the sedation medicines used during the test).    SYMPTOMS TO REPORT IMMEDIATELY: A gastroenterologist can be reached at any hour.  During normal business hours, 8:30 AM to 5:00 PM Monday through Friday, call [phone].  After hours and on weekends, please call the GI answering service at [phone] who will take a message and have the physician on call contact you.   Following upper endoscopy (EGD)  Vomiting of blood or coffee ground material  New chest pain or pain under the shoulder blades  Painful or persistently difficult swallowing  New shortness of breath  Fever of 100F or higher  Black, tarry-looking  stools  FOLLOW UP: If any biopsies were taken you will be contacted by phone or by letter within the next 1-3 weeks.  Call your gastroenterologist if you have not heard about the biopsies in 3 weeks.  Our staff will call the home number listed on your records the next business day following your procedure to check on you and address any questions or concerns that you may have at that time regarding the information given to you following your procedure. This is a courtesy call and so if there is no answer at the home number and we have not heard from you through the emergency physician on call, we will assume that you have returned to your regular daily activities without incident.  SIGNATURES/CONFIDENTIALITY: You and/or your care partner have signed paperwork which will be entered into your electronic medical record.  These signatures attest to the fact that that the information above on your After Visit Summary has been reviewed and is understood.  Full responsibility of the confidentiality of this discharge information lies with you and/or your care-partner.   Dilation Diet-handout given  Stricture-handout given  Continue dexilant  Reflux precautions-handout given

## 2012-10-27 ENCOUNTER — Telehealth: Payer: Self-pay | Admitting: *Deleted

## 2012-10-27 NOTE — Telephone Encounter (Signed)
  Follow up Call-  Call back number 10/26/2012  Post procedure Call Back phone  # [phone]  Permission to leave phone message Yes     Patient questions:  Do you have a fever, pain , or abdominal swelling? no Pain Score  0 *  Have you tolerated food without any problems? yes  Have you been able to return to your normal activities? yes  Do you have any questions about your discharge instructions: Diet   no Medications  no Follow up visit  no  Do you have questions or concerns about your Care? no  Actions: * If pain score is 4 or above: No action needed, pain <4.

## 2012-11-03 ENCOUNTER — Encounter: Payer: Self-pay | Admitting: Critical Care Medicine

## 2012-11-03 ENCOUNTER — Ambulatory Visit (INDEPENDENT_AMBULATORY_CARE_PROVIDER_SITE_OTHER): Payer: BC Managed Care – PPO | Admitting: Critical Care Medicine

## 2012-11-03 VITALS — BP 130/74 | HR 89 | Temp 98.9°F | Ht 66.0 in | Wt 256.0 lb

## 2012-11-03 DIAGNOSIS — D869 Sarcoidosis, unspecified: Secondary | ICD-10-CM

## 2012-11-03 DIAGNOSIS — J99 Respiratory disorders in diseases classified elsewhere: Secondary | ICD-10-CM | POA: Diagnosis not present

## 2012-11-03 DIAGNOSIS — D860 Sarcoidosis of lung: Secondary | ICD-10-CM

## 2012-11-03 MED ORDER — HYDROCOD POLST-CHLORPHEN POLST 10-8 MG/5ML PO LQCR: 10.0000 mL | Freq: Two times a day (BID) | ORAL | Status: DC | PRN

## 2012-11-03 NOTE — Progress Notes (Signed)
Subjective:    Patient ID: [name], female    DOB: [birth year], 52 y.o.   MRN: [id number]  HPI  16 WF with Sarcoid, GERD, VCD, cyclical cough  07/19/2012 Ill with cough. Rx pred : 40x 5days then 20/d for 5days. Plus levaquin x 10days (2 left).  Pt ill since 2 weeks. First went to Urgent Care.  Rx steroids.  BS up and down.  3 ED visits in 6 months Now on insulin.    Now: cough is dry.  Denies heartburn.  No prob swallowing.  Notes qhs dyspnea and cough.  Notes dyspnea on exertion.  No chest xray has been done. No f/c/s >>Add Augmentin and cyclical cough regimen.   10/23 Follow up of Sarcoid /cough  Returns for follow up for Sarcoid and brochitis/sinusitis flare  She has now finished 10 days of Levaquin and Aumgentin  Along with steroid burst w/ cyclical cough regimen Now back on Prednisone 10mg  daily  She is feeling some better with decreased cough and congestion. Still has some intermittent nasal congestion. That is mostly clear. Has some yellow mucus, first think in am,  but seems to clear with  saline rinses. Denies any hemoptysis, orthopnea, PND, leg swelling, or fever Has returned back to [place] and is using Mucinex Tessalon and Hycodan for cough control  11/03/2012 F/u sarcoid. Still with a cough but manageable.  Occ wheeze.  No hemoptysis .    Chest pain is noted anterior chest . EGD done 10/26/12:  Was normal.   A dilation was performed.     Note the pt is now followed at [facility] per [facility]       Review of Systems  Constitutional:   No  weight loss, night sweats,  Fevers, chills, fatigue, or  lassitude.  HEENT:   No headaches,  Difficulty swallowing,  Tooth/dental problems, or  Sore throat,                No sneezing, itching, ear ache,  +nasal congestion, post nasal drip,   CV:  No chest pain,  Orthopnea, PND, swelling in lower extremities, anasarca, dizziness, palpitations, syncope.   GI  No heartburn, indigestion, abdominal pain, nausea, vomiting,  diarrhea, change in bowel habits, loss of appetite, bloody stools.   Resp:  ,  No coughing up of blood.   [name]  No chest wall deformity  Skin: no rash or lesions.  GU: no dysuria, change in color of urine, no urgency or frequency.  No flank pain, no hematuria   MS:  No joint pain or swelling.  No decreased range of motion.  No back pain.  Psych:  No change in mood or affect. No depression or anxiety.  No memory loss.    '    Objective:   Physical Exam BP 130/74  Pulse 89  Temp 98.9 F (37.2 C) (Oral)  Ht 5\' 6"  (1.676 m)  Wt 256 lb (116.121 kg)  BMI 41.32 kg/m2  SpO2 97%  GEN: A/Ox3; pleasant , NAD , obese  HEENT:  [place]/AT,  EACs-clear, TMs-wnl, NOSE-clear drainage THROAT-clear, no lesions, no postnasal drip or exudate noted.   NECK:  Supple w/ fair ROM; no JVD; normal carotid impulses w/o bruits; no thyromegaly or nodules palpated; no lymphadenopathy.  RESP  Coarse BS  w/o, wheezes/ rales/ or rhonchi.no accessory muscle use, no dullness to percussion  CARD:  RRR, no m/r/g  , no peripheral edema, pulses intact, no cyanosis or clubbing.  GI:   Soft &  nt; nml bowel sounds; no organomegaly or masses detected.  Musco: Warm bil, no deformities or joint swelling noted.   Neuro: alert, no focal deficits noted.    Skin: Warm, no lesions or rashes         Assessment & Plan:   Sarcoidosis of lung History of pulmonary sarcoidosis with mediastinal and hilar adenopathy. Chronic chest pain likely due to sarcoidosis. The patient's been followed by [facility] for the same condition. They did not recommend any additional therapies. She is on azithromycin 3 times weekly for infection prevention. Note pulmonary function studies in his January of 2013 revealed moderate restriction but no significant reduction in diffusion capacity. Plan Manage patient off corticosteroids and off other anti-inflammatory agents for now For the patient's cyclical cough will continue to prescribe as  needed Tussionex   Updated Medication List Outpatient Encounter Prescriptions as of 11/03/2012  Medication Sig Dispense Refill  . albuterol (PROVENTIL HFA;VENTOLIN HFA) 108 (90 BASE) MCG/ACT inhaler Inhale 2 puffs into the lungs every 6 (six) hours as needed. For wheezing.      [name] albuterol (PROVENTIL) (5 MG/ML) 0.5% nebulizer solution Take 2.5 mg by nebulization every 6 (six) hours as needed. For wheezing.      [name] allopurinol (ZYLOPRIM) 100 MG tablet Take 100 mg by mouth daily.      [name] aspirin EC 81 MG tablet Take 81 mg by mouth daily.      [name] atenolol (TENORMIN) 50 MG tablet Take 50 mg by mouth daily.      [name] atorvastatin (LIPITOR) 40 MG tablet TAKE ONE TABLET BY MOUTH ONE TIME DAILY  90 tablet  1  . azithromycin (ZITHROMAX) 250 MG tablet Take 250 mg by mouth 3 (three) times a week. Prescribed by [facility]      . benzonatate (TESSALON) 100 MG capsule Take 1 capsule (100 mg total) by mouth every 6 (six) hours as needed for cough.  60 capsule  4  . buPROPion (WELLBUTRIN XL) 300 MG 24 hr tablet TAKE ONE TABLET BY MOUTH ONE TIME DAILY  30 tablet  5  . Chlorpheniramine Tannate 12 MG TABS HOLD for two weeks  30 each  6  . chlorpheniramine-HYDROcodone (TUSSIONEX) 10-8 MG/5ML LQCR Take 10 mLs by mouth every 12 (twelve) hours as needed. For cough.  240 mL  0  . cholecalciferol (VITAMIN D) 1000 UNITS tablet Take 1,000 Units by mouth 2 (two) times daily.      . ciclesonide (OMNARIS) 50 MCG/ACT nasal spray Place 1 spray into both nostrils daily.       . cloNIDine (CATAPRES) 0.1 MG tablet Take 0.1 mg by mouth daily.       [name] dexlansoprazole (DEXILANT) 60 MG capsule Take 60 mg by mouth daily.      [name] estradiol (ESTRACE) 2 MG tablet Take 2 mg by mouth daily.      . fenofibrate (TRICOR) 145 MG tablet Take 145 mg by mouth daily.        . furosemide (LASIX) 20 MG tablet Take 1 tablet (20 mg total) by mouth 2 (two) times daily.  30 tablet  4  . glyBURIDE (DIABETA) 2.5 MG tablet TAKE 1 TABLET (2.5 MG TOTAL)  BY MOUTH DAILY  WITH BREAKFAST.  30 tablet  4  . insulin glargine (LANTUS SOLOSTAR) 100 UNIT/ML injection Inject 20 Units into the skin at bedtime.  1 pen  3  . Insulin Pen Needle 31G X 8 MM MISC 1 each by Does not apply route daily.  30 each  3  . LORazepam (ATIVAN) 1 MG tablet Take 1 mg by mouth every 8 (eight) hours as needed. For anxiety.      [name] losartan (COZAAR) 50 MG tablet Take 50 mg by mouth at bedtime.      . metFORMIN (GLUCOPHAGE) 1000 MG tablet Take 1 tablet (1,000 mg total) by mouth 2 (two) times daily with a meal.  180 tablet  3  . Mometasone Furo-Formoterol Fum (DULERA) 200-5 MCG/ACT AERO Inhale 2 puffs into the lungs 2 (two) times daily.  8.8 g  1  . nitroGLYCERIN (NITROSTAT) 0.4 MG SL tablet Place 0.4 mg under the tongue every 5 (five) minutes as needed. For chest pain.      [name] omeprazole (PRILOSEC) 20 MG capsule Take 1 capsule (20 mg total) by mouth daily.  32 capsule  0  . potassium chloride SA (K-DUR,KLOR-CON) 20 MEQ tablet Take 20 mEq by mouth 3 (three) times daily.      . predniSONE (DELTASONE) 5 MG tablet Take 12.5 mg by mouth daily.      [name] venlafaxine XR (EFFEXOR-XR) 75 MG 24 hr capsule Take 75 mg by mouth 2 (two) times daily.      . [DISCONTINUED] chlorpheniramine-HYDROcodone (TUSSIONEX) 10-8 MG/5ML LQCR Take 10 mLs by mouth every 12 (twelve) hours as needed. For cough.  240 mL  0  . [DISCONTINUED] predniSONE (DELTASONE) 10 MG tablet Take 12.5 mg by mouth daily.       . [DISCONTINUED] azithromycin (ZITHROMAX) 500 MG tablet Take 500 mg by mouth as directed.      . [DISCONTINUED] HYDROcodone-homatropine (HYCODAN) 5-1.5 MG/5ML syrup As needed      . [DISCONTINUED] mometasone (NASONEX) 50 MCG/ACT nasal spray Place 2 sprays into the nose daily.  17 g  6

## 2012-11-03 NOTE — Assessment & Plan Note (Signed)
History of pulmonary sarcoidosis with mediastinal and hilar adenopathy. Chronic chest pain likely due to sarcoidosis. The patient's been followed by [facility] for the same condition. They did not recommend any additional therapies. She is on azithromycin 3 times weekly for infection prevention. Note pulmonary function studies in his January of 2013 revealed moderate restriction but no significant reduction in diffusion capacity. Plan Manage patient off corticosteroids and off other anti-inflammatory agents for now For the patient's cyclical cough will continue to prescribe as needed Tussionex

## 2012-11-03 NOTE — Patient Instructions (Signed)
No changes in medications Refill on Tussionex as needed

## 2012-11-06 ENCOUNTER — Telehealth: Payer: Self-pay | Admitting: Internal Medicine

## 2012-11-06 ENCOUNTER — Other Ambulatory Visit: Payer: Self-pay | Admitting: *Deleted

## 2012-11-06 DIAGNOSIS — E119 Type 2 diabetes mellitus without complications: Secondary | ICD-10-CM

## 2012-11-06 DIAGNOSIS — R1310 Dysphagia, unspecified: Secondary | ICD-10-CM

## 2012-11-06 DIAGNOSIS — K219 Gastro-esophageal reflux disease without esophagitis: Secondary | ICD-10-CM

## 2012-11-06 MED ORDER — OMEPRAZOLE 20 MG PO CPDR: 20.0000 mg | DELAYED_RELEASE_CAPSULE | Freq: Every day | ORAL | Status: DC

## 2012-11-06 MED ORDER — FUROSEMIDE 20 MG PO TABS: 20.0000 mg | ORAL_TABLET | Freq: Two times a day (BID) | ORAL | Status: DC

## 2012-11-06 NOTE — Telephone Encounter (Signed)
Refilled Omeprazole 

## 2012-11-08 IMAGING — CT CT ANGIO CHEST
2 of 6 series · 19 of 46 positions shown · IV contrast (APPLIED)
Comparison: 09/09/2011.

CLINICAL DATA: Chest pain and shortness of breath.

CT ANGIOGRAPHY CHEST
TECHNIQUE: Multidetector CT imaging of the chest using the
standard protocol during bolus administration of intravenous
contrast. Multiplanar reconstructed images including MIPs were
obtained and reviewed to evaluate the vascular anatomy.
Contrast: 100mL OMNIPAQUE IOHEXOL 350 MG/ML IV SOLN

[Series 6: pulm embolism 1.0 b25f thin · axial · 0.65mm/px · z∈[+1223,+1433]mm · 16 of 232 slices shown]
[im 11/232  lung]
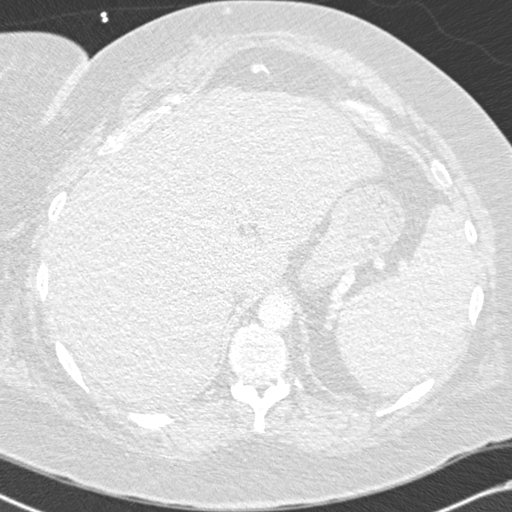
[im 31/232  soft-tissue]
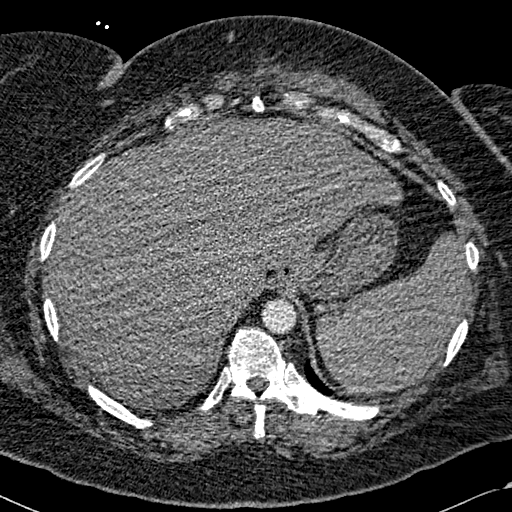
[im 41/232  lung]
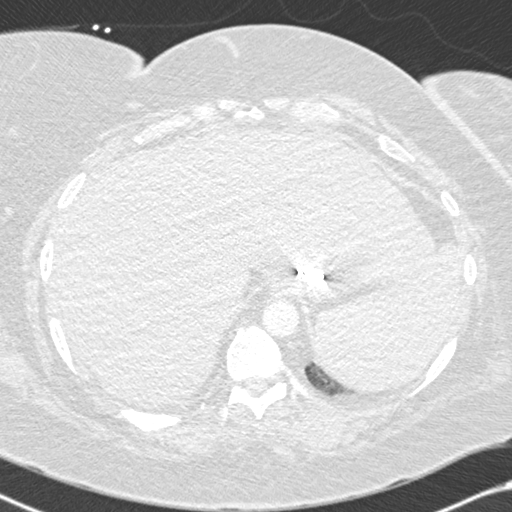
[im 51/232  soft-tissue]
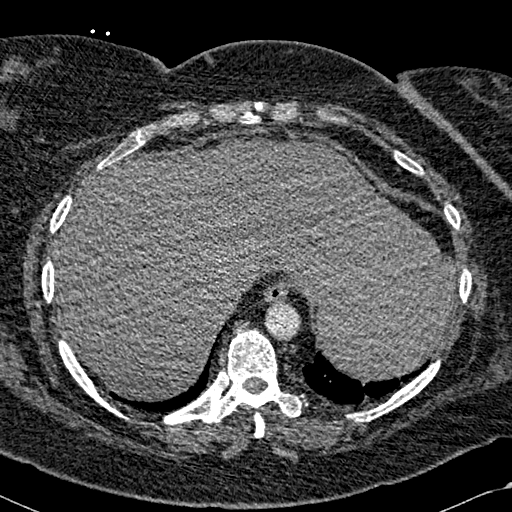
[im 71/232  lung]
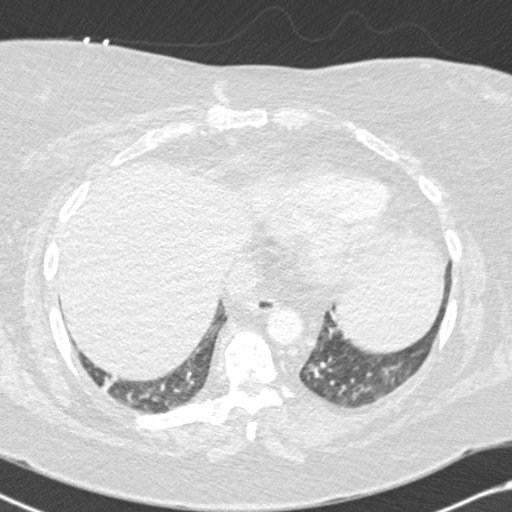
[im 81/232  soft-tissue]
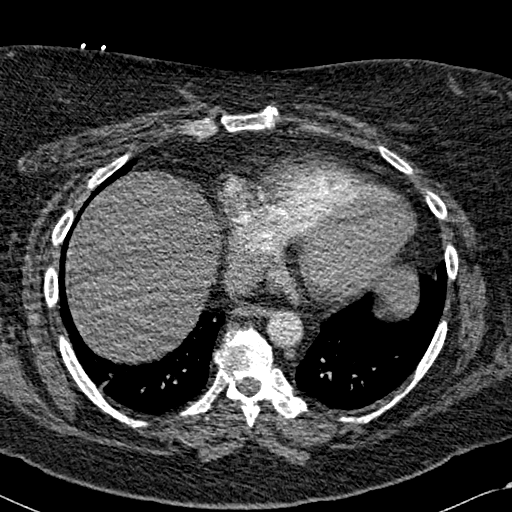
[im 91/232  lung]
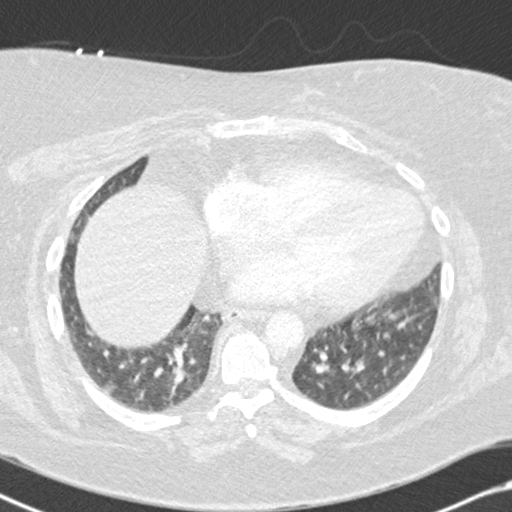
[im 111/232  soft-tissue]
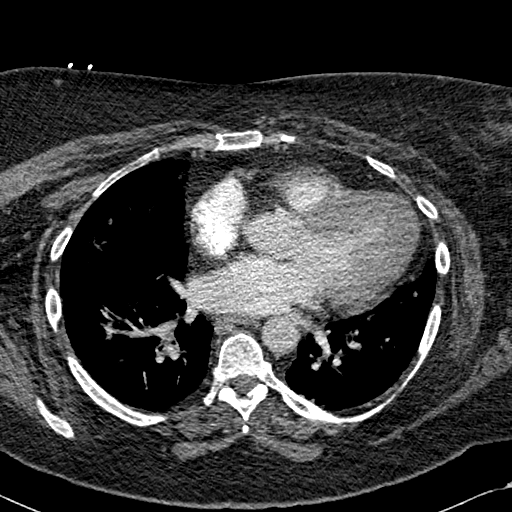
[im 121/232  lung]
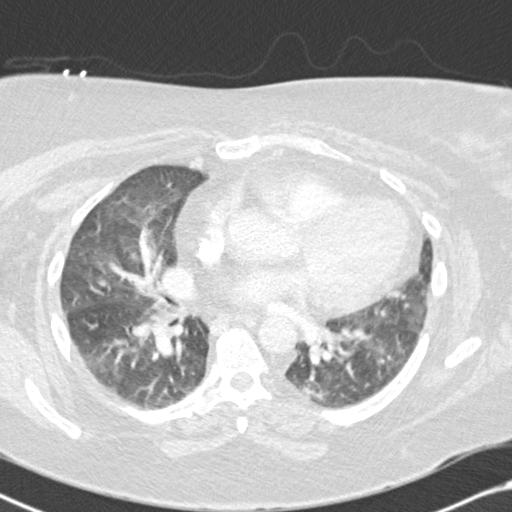
[im 141/232  soft-tissue]
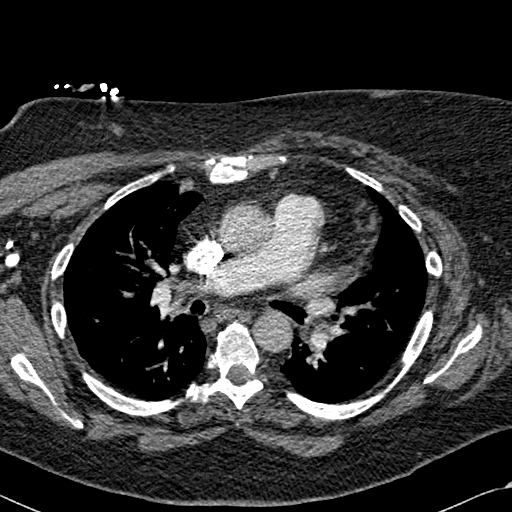
[im 151/232  lung]
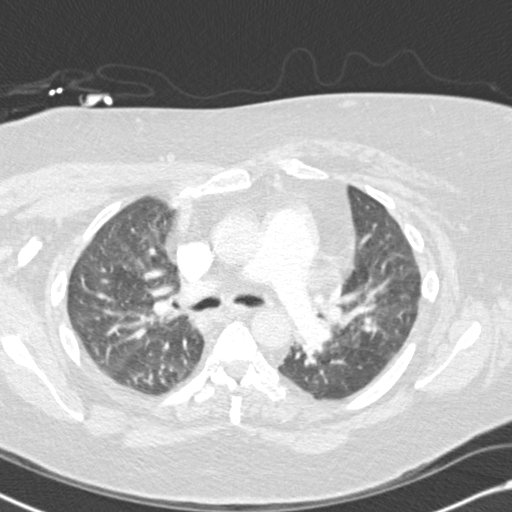
[im 161/232  soft-tissue]
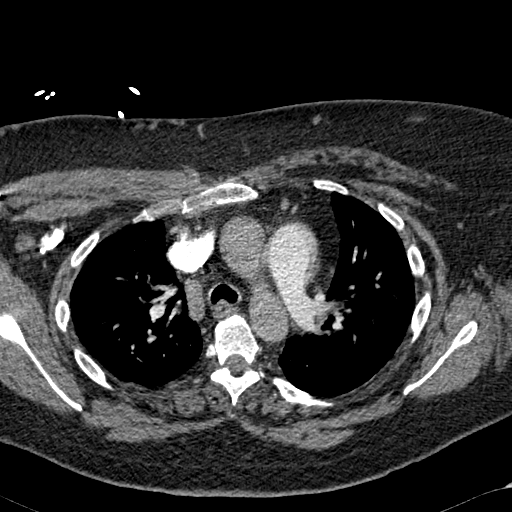
[im 181/232  lung]
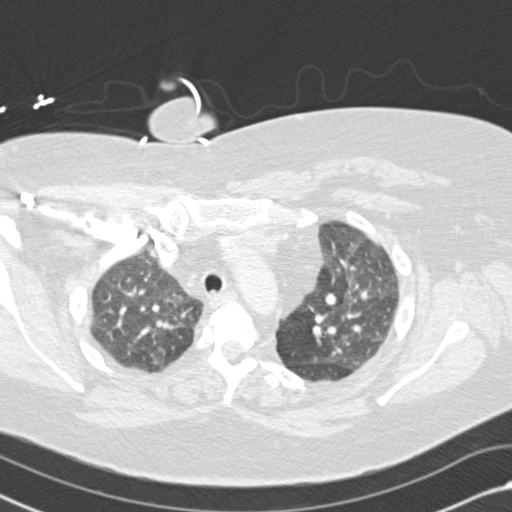
[im 191/232  soft-tissue]
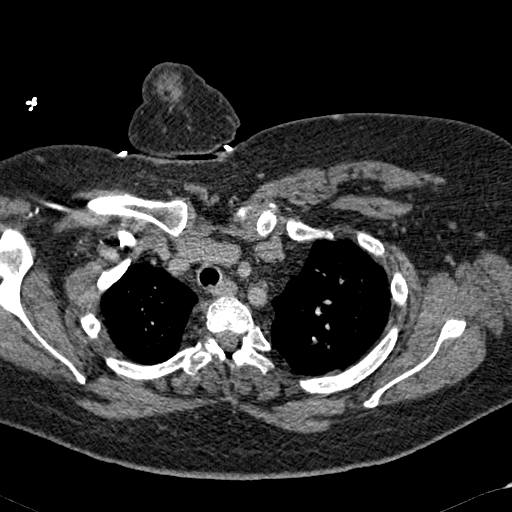
[im 201/232  lung]
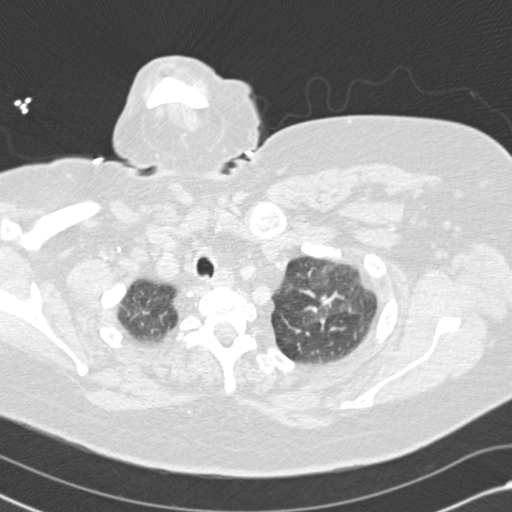
[im 221/232  soft-tissue]
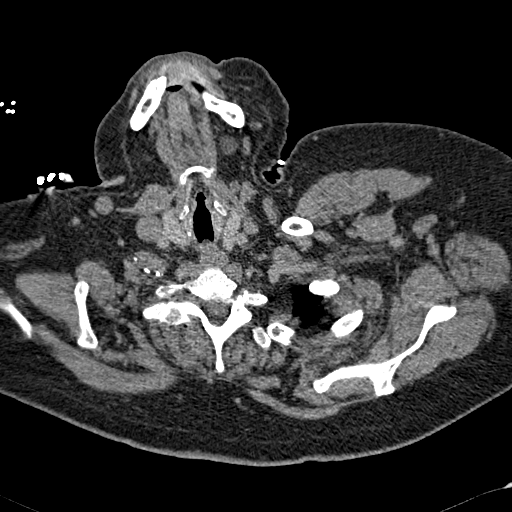

[Series 602: cor · coronal · 0.65mm/px · 3 of 123 slices shown]
[im 31/123  soft-tissue]
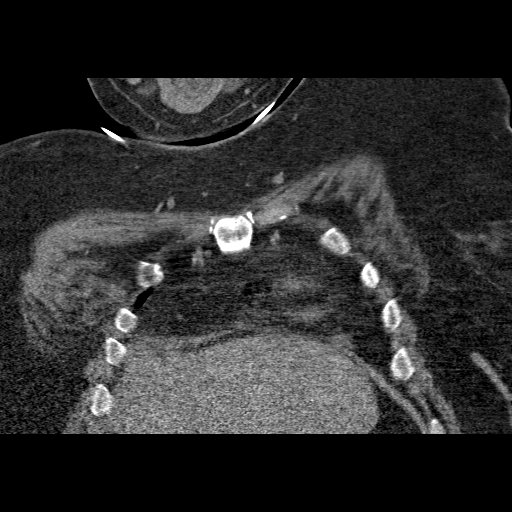
[im 62/123  soft-tissue]
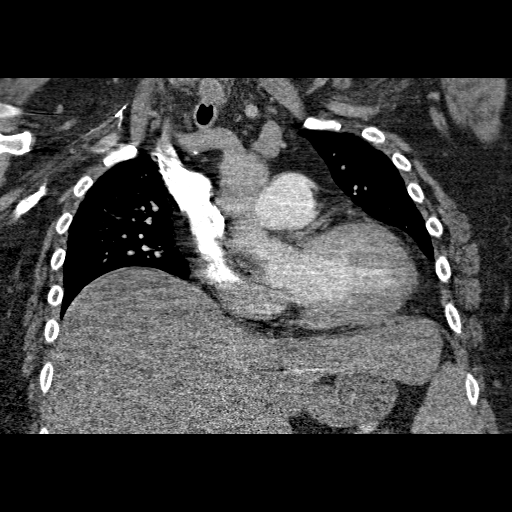
[im 92/123  soft-tissue]
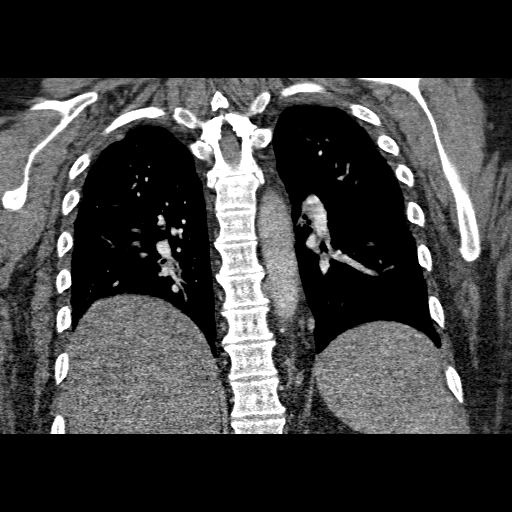

[19 of 46 positions shown; findings below may reference images not displayed]

FINDINGS: No pulmonary embolus.  No pathologically enlarged
mediastinal, hilar or axillary lymph nodes.  Heart is enlarged.  No
pericardial effusion. There is focal left coronary artery
calcification.

Added density in the lungs bilaterally is likely due to expiratory
phase imaging.  Minimal subsegmental atelectasis in the lower
lobes.  There may be clustered peribronchovascular nodularity in
the left lower lobe (images 34-36), new from the prior study and
likely infectious or post infectious in etiology.  No pleural
fluid.  Airway is unremarkable.

Incidental imaging of the upper abdomen shows no acute findings.
No worrisome lytic or sclerotic lesions.
IMPRESSION: 1.  No pulmonary embolus.
2.  No acute findings to explain the patient's given symptoms.
3.  Focal left coronary artery calcification.

## 2012-11-08 IMAGING — CR DG CHEST 2V
2 series · 2 of 2 positions shown · non-contrast
Comparison: 07/23/2012

CLINICAL DATA: Fall.  SOB

CHEST - 2 VIEW

[w chest pa]
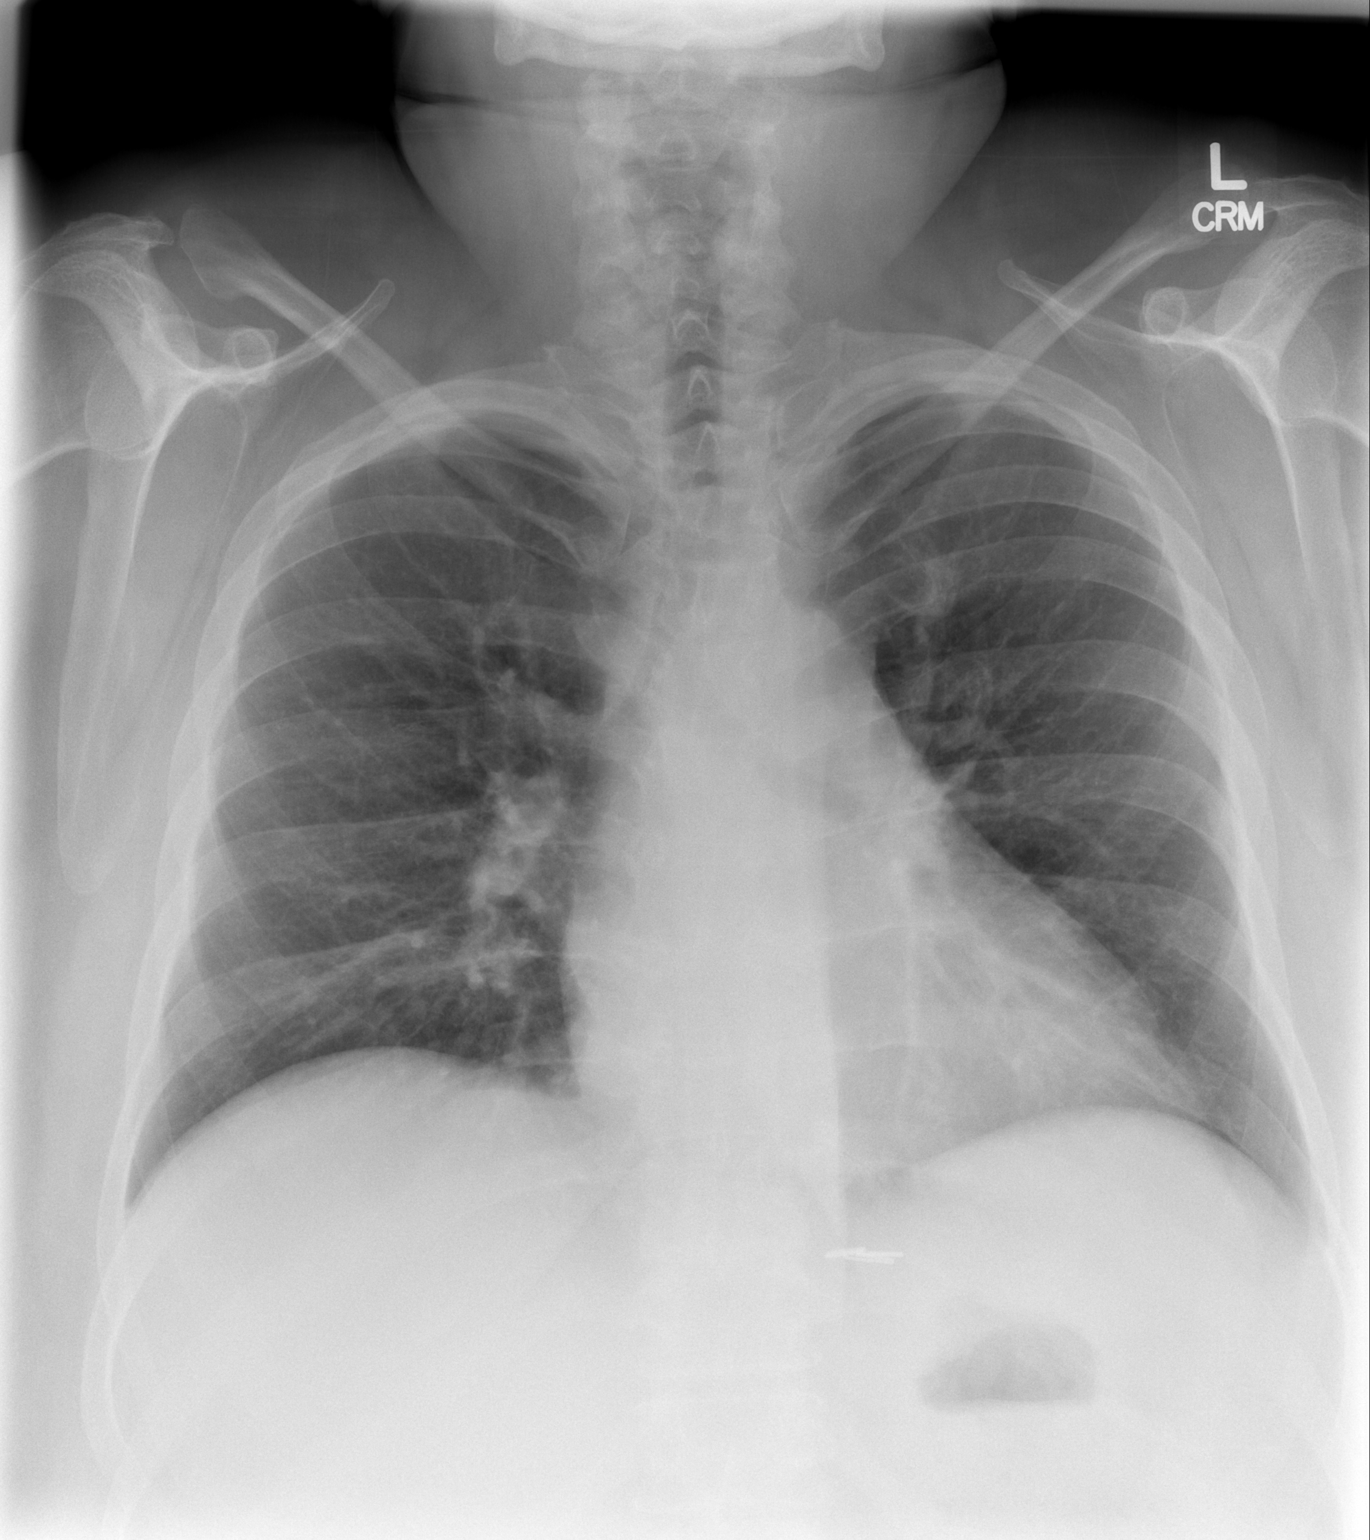

[w chest lat]
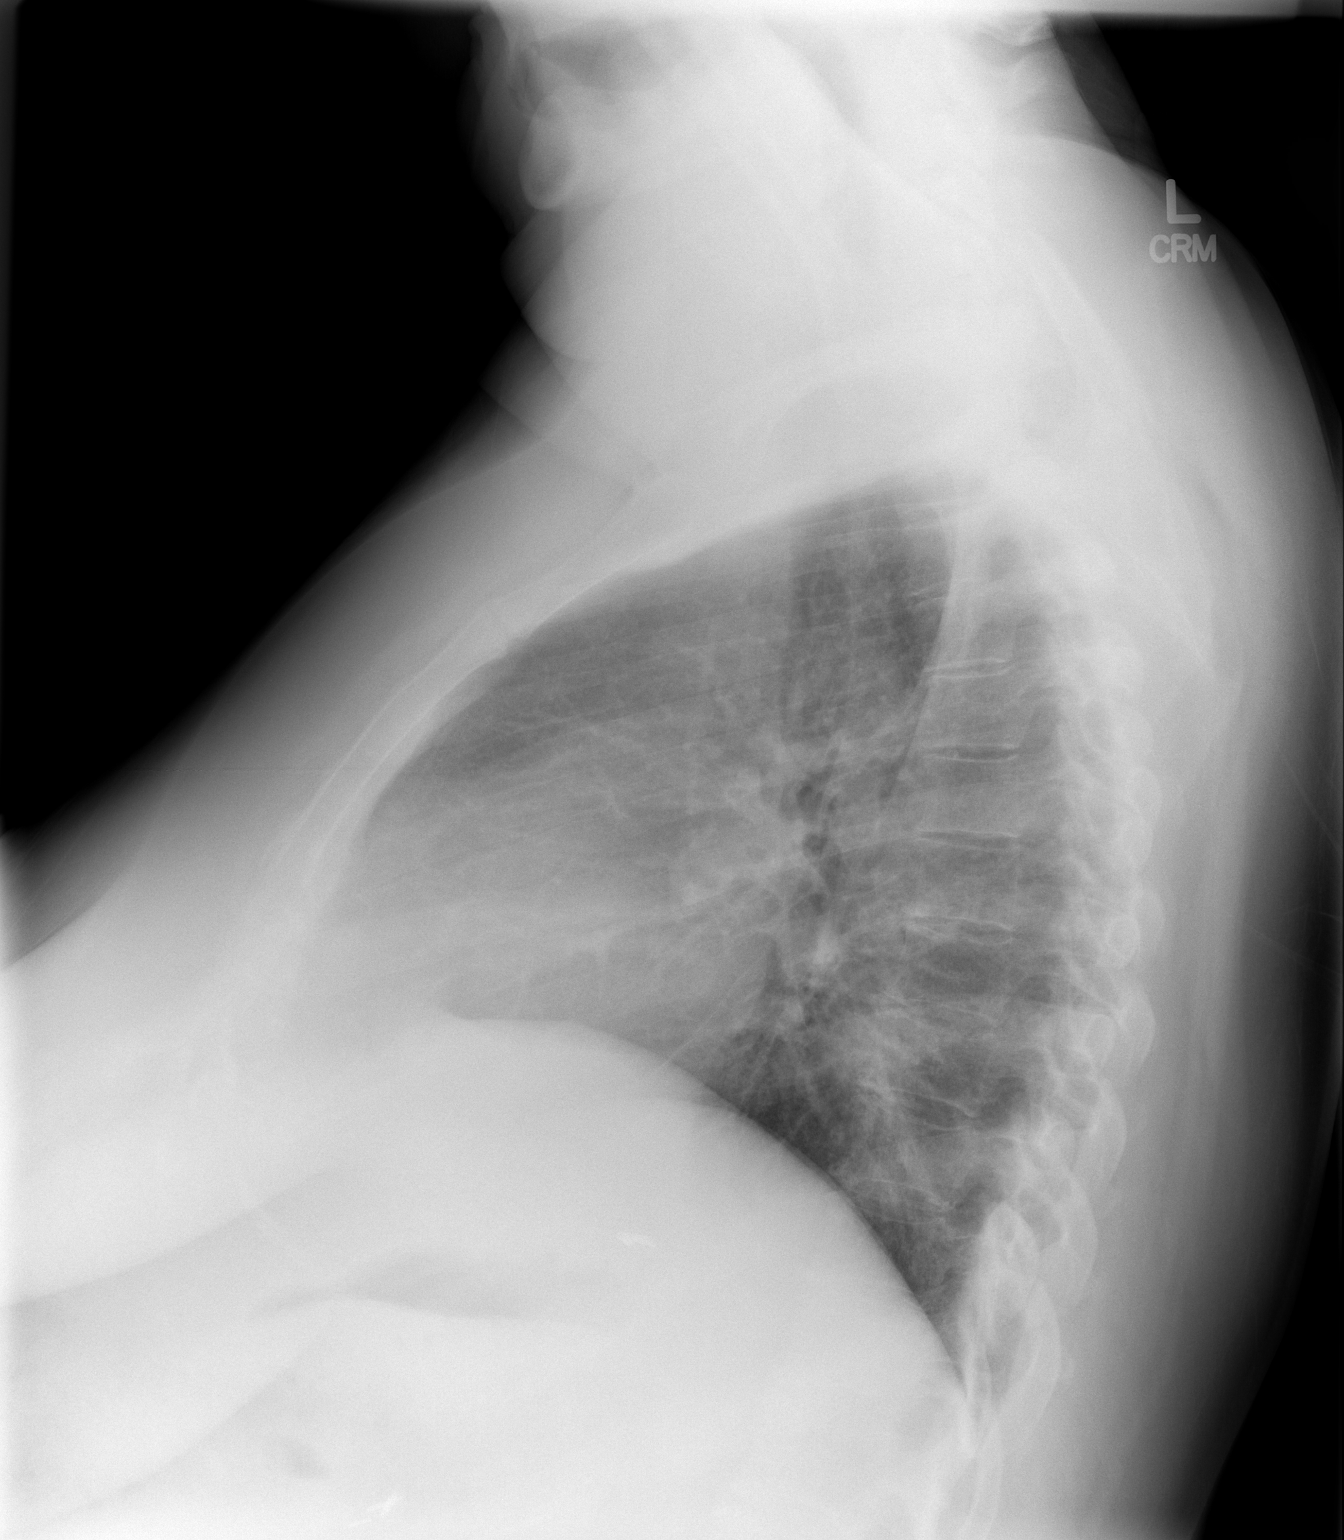

[2 of 2 positions shown; findings below may reference images not displayed]

FINDINGS: Heart size is mildly enlarged.

No pleural effusion or edema.

No airspace consolidation identified.

Review of the visualized osseous structures is unremarkable.
IMPRESSION: 1.  No acute cardiopulmonary abnormalities.

## 2012-11-15 DIAGNOSIS — M719 Bursopathy, unspecified: Secondary | ICD-10-CM | POA: Diagnosis not present

## 2012-11-15 DIAGNOSIS — M67919 Unspecified disorder of synovium and tendon, unspecified shoulder: Secondary | ICD-10-CM | POA: Diagnosis not present

## 2012-11-15 DIAGNOSIS — M25819 Other specified joint disorders, unspecified shoulder: Secondary | ICD-10-CM | POA: Diagnosis not present

## 2012-12-01 ENCOUNTER — Encounter: Payer: Self-pay | Admitting: Family Medicine

## 2012-12-01 ENCOUNTER — Ambulatory Visit (INDEPENDENT_AMBULATORY_CARE_PROVIDER_SITE_OTHER): Payer: BC Managed Care – PPO | Admitting: Family Medicine

## 2012-12-01 VITALS — BP 120/76 | HR 79 | Temp 98.3°F | Wt 257.0 lb

## 2012-12-01 DIAGNOSIS — L989 Disorder of the skin and subcutaneous tissue, unspecified: Secondary | ICD-10-CM | POA: Diagnosis not present

## 2012-12-01 MED ORDER — MUPIROCIN 2 % EX OINT: TOPICAL_OINTMENT | Freq: Three times a day (TID) | CUTANEOUS | Status: DC

## 2012-12-01 NOTE — Patient Instructions (Addendum)
-  apply ointment once daily  -keep clean and dry  -if worsening, spread or does not heal in 2 weeks call and see a dermatologist

## 2012-12-01 NOTE — Progress Notes (Signed)
Chief Complaint  Patient presents with  . ulcer that popped    nausea, redness, no drainage; swelling   . Headache    HPI:  Acute visit for Bump on Abd: -started yesterday - has looked about the same since yesterday - but a little redder today -skin lesion on abdomen -a little sore -no oozing  -no trauma or insect that she knows of -no new medicaitons -FBS have all been <130 recently -used a cream her father had - doesn't know what the cream was -denies fevers, chills, malaise, rash elsewhere - has had a little sinus congestion   ROS: See pertinent positives and negatives per HPI.  Past Medical History  Diagnosis Date  . Sarcoidosis 04/10/2007  . DIABETES MELLITUS, TYPE II 08/21/2006  . HYPERLIPIDEMIA 08/21/2006  . GOUT 08/20/2010  . OBESITY 06/04/2009  . ANEMIA-UNSPECIFIED 09/18/2009  . HYPERTENSION 08/21/2006  . GERD 08/21/2006  . Shortness of breath   . SLEEP APNEA 04/21/2009    last testing was negative  . Internal hemorrhoids     Family History  Problem Relation Age of Onset  . Diabetes Father   . Heart attack Father   . Coronary artery disease Father   . Heart failure Father   . COPD Mother   . Emphysema Mother   . Asthma Mother   . Heart failure Mother   . Sarcoidosis Maternal Uncle   . Colon cancer Neg Hx   . Lung cancer Brother   . Cancer Brother     History   Social History  . Marital Status: Married    Spouse Name: N/A    Number of Children: N/A  . Years of Education: N/A   Occupational History  . DISABLED    Social History Main Topics  . Smoking status: Never Smoker   . Smokeless tobacco: Never Used  . Alcohol Use: No  . Drug Use: No  . Sexually Active: Yes   Other Topics Concern  . None   Social History Narrative  . None    Current outpatient prescriptions:albuterol (PROVENTIL HFA;VENTOLIN HFA) 108 (90 BASE) MCG/ACT inhaler, Inhale 2 puffs into the lungs every 6 (six) hours as needed. For wheezing., Disp: , Rfl: ;  albuterol  (PROVENTIL) (5 MG/ML) 0.5% nebulizer solution, Take 2.5 mg by nebulization every 6 (six) hours as needed. For wheezing., Disp: , Rfl: ;  allopurinol (ZYLOPRIM) 100 MG tablet, Take 100 mg by mouth daily., Disp: , Rfl:  aspirin EC 81 MG tablet, Take 81 mg by mouth daily., Disp: , Rfl: ;  atenolol (TENORMIN) 50 MG tablet, Take 50 mg by mouth daily., Disp: , Rfl: ;  atorvastatin (LIPITOR) 40 MG tablet, TAKE ONE TABLET BY MOUTH ONE TIME DAILY, Disp: 90 tablet, Rfl: 1;  azithromycin (ZITHROMAX) 250 MG tablet, Take 250 mg by mouth 3 (three) times a week. Prescribed by [facility], Disp: , Rfl:  benzonatate (TESSALON) 100 MG capsule, Take 1 capsule (100 mg total) by mouth every 6 (six) hours as needed for cough., Disp: 60 capsule, Rfl: 4;  buPROPion (WELLBUTRIN XL) 300 MG 24 hr tablet, TAKE ONE TABLET BY MOUTH ONE TIME DAILY, Disp: 30 tablet, Rfl: 5;  Chlorpheniramine Tannate 12 MG TABS, HOLD for two weeks, Disp: 30 each, Rfl: 6 chlorpheniramine-HYDROcodone (TUSSIONEX) 10-8 MG/5ML LQCR, Take 10 mLs by mouth every 12 (twelve) hours as needed. For cough., Disp: 240 mL, Rfl: 0;  cholecalciferol (VITAMIN D) 1000 UNITS tablet, Take 1,000 Units by mouth 2 (two) times daily., Disp: , Rfl: ;  ciclesonide (OMNARIS) 50 MCG/ACT nasal spray, Place 1 spray into both nostrils daily. , Disp: , Rfl: ;  cloNIDine (CATAPRES) 0.1 MG tablet, Take 0.1 mg by mouth daily. , Disp: , Rfl:  dexlansoprazole (DEXILANT) 60 MG capsule, Take 60 mg by mouth daily., Disp: , Rfl: ;  estradiol (ESTRACE) 2 MG tablet, Take 2 mg by mouth daily., Disp: , Rfl: ;  fenofibrate (TRICOR) 145 MG tablet, Take 145 mg by mouth daily.  , Disp: , Rfl: ;  furosemide (LASIX) 20 MG tablet, Take 1 tablet (20 mg total) by mouth 2 (two) times daily., Disp: 60 tablet, Rfl: 4 glyBURIDE (DIABETA) 2.5 MG tablet, TAKE 1 TABLET (2.5 MG TOTAL)  BY MOUTH DAILY WITH BREAKFAST., Disp: 30 tablet, Rfl: 4;  insulin glargine (LANTUS SOLOSTAR) 100 UNIT/ML injection, Inject 20 Units into the skin  at bedtime., Disp: 1 pen, Rfl: 3;  Insulin Pen Needle 31G X 8 MM MISC, 1 each by Does not apply route daily., Disp: 30 each, Rfl: 3 LORazepam (ATIVAN) 1 MG tablet, Take 1 mg by mouth every 8 (eight) hours as needed. For anxiety., Disp: , Rfl: ;  losartan (COZAAR) 50 MG tablet, Take 50 mg by mouth at bedtime., Disp: , Rfl: ;  metFORMIN (GLUCOPHAGE) 1000 MG tablet, Take 1 tablet (1,000 mg total) by mouth 2 (two) times daily with a meal., Disp: 180 tablet, Rfl: 3 Mometasone Furo-Formoterol Fum (DULERA) 200-5 MCG/ACT AERO, Inhale 2 puffs into the lungs 2 (two) times daily., Disp: 8.8 g, Rfl: 1;  nitroGLYCERIN (NITROSTAT) 0.4 MG SL tablet, Place 0.4 mg under the tongue every 5 (five) minutes as needed. For chest pain., Disp: , Rfl: ;  omeprazole (PRILOSEC) 20 MG capsule, Take 1 capsule (20 mg total) by mouth daily., Disp: 32 capsule, Rfl: 0 omeprazole (PRILOSEC) 20 MG capsule, Take 1 capsule (20 mg total) by mouth daily., Disp: 90 capsule, Rfl: 1;  potassium chloride SA (K-DUR,KLOR-CON) 20 MEQ tablet, Take 20 mEq by mouth 3 (three) times daily., Disp: , Rfl: ;  predniSONE (DELTASONE) 5 MG tablet, Take 12.5 mg by mouth daily., Disp: , Rfl: ;  venlafaxine XR (EFFEXOR-XR) 75 MG 24 hr capsule, Take 75 mg by mouth 2 (two) times daily., Disp: , Rfl:  mupirocin ointment (BACTROBAN) 2 %, Apply topically 3 (three) times daily., Disp: 22 g, Rfl: 0  EXAM:  Filed Vitals:   12/01/12 1018  BP: 120/76  Pulse: 79  Temp: 98.3 F (36.8 C)    Body mass index is 41.5 kg/(m^2).  GENERAL: vitals reviewed and listed above, alert, oriented, appears well hydrated and in no acute distress  HEENT: atraumatic, conjunttiva clear, no obvious abnormalities on inspection of external nose and ears  NECK: no obvious masses on inspection  SKIN: 4-3cm sharply demarcated erythematous flat lesion with thin scab formation on abdomen  MS: moves all extremities without noticeable abnormality  PSYCH: pleasant and cooperative, no  obvious depression or anxiety  ASSESSMENT AND PLAN:  Discussed the following assessment and plan:  Skin lesion - Plan: mupirocin ointment (BACTROBAN) 2 % -has appearance of healing abrasion or burn - does not appear to be infected -unsure of etiology and had colleague look at this as well and he was also perplexed -will do topical antibiotic ointment and provided numbers for dermatology if worsens, spreads or does not heal in 1-2 weeks -Patient advised to return or notify a doctor immediately if symptoms worsen or persist or new concerns arise.  Patient Instructions  -apply ointment once daily  -  keep clean and dry  -if worsening, spread or does not heal in 2 weeks call and see a dermatologist     [name].

## 2012-12-05 DIAGNOSIS — E1169 Type 2 diabetes mellitus with other specified complication: Secondary | ICD-10-CM | POA: Diagnosis not present

## 2012-12-05 DIAGNOSIS — D235 Other benign neoplasm of skin of trunk: Secondary | ICD-10-CM | POA: Diagnosis not present

## 2012-12-05 DIAGNOSIS — L89899 Pressure ulcer of other site, unspecified stage: Secondary | ICD-10-CM | POA: Diagnosis not present

## 2012-12-16 ENCOUNTER — Encounter (HOSPITAL_COMMUNITY): Payer: Self-pay | Admitting: Emergency Medicine

## 2012-12-16 ENCOUNTER — Inpatient Hospital Stay (HOSPITAL_COMMUNITY)
Admission: EM | Admit: 2012-12-16 | Discharge: 2012-12-18 | DRG: 087 | Disposition: A | Discharge status: Home / Self Care | Payer: BC Managed Care – PPO | Attending: Internal Medicine | Admitting: Internal Medicine

## 2012-12-16 ENCOUNTER — Emergency Department (HOSPITAL_COMMUNITY): Payer: BC Managed Care – PPO

## 2012-12-16 DIAGNOSIS — D869 Sarcoidosis, unspecified: Secondary | ICD-10-CM | POA: Diagnosis not present

## 2012-12-16 DIAGNOSIS — E1159 Type 2 diabetes mellitus with other circulatory complications: Secondary | ICD-10-CM | POA: Diagnosis present

## 2012-12-16 DIAGNOSIS — J4489 Other specified chronic obstructive pulmonary disease: Secondary | ICD-10-CM | POA: Diagnosis present

## 2012-12-16 DIAGNOSIS — R21 Rash and other nonspecific skin eruption: Secondary | ICD-10-CM | POA: Diagnosis not present

## 2012-12-16 DIAGNOSIS — Z79899 Other long term (current) drug therapy: Secondary | ICD-10-CM

## 2012-12-16 DIAGNOSIS — R059 Cough, unspecified: Secondary | ICD-10-CM | POA: Diagnosis not present

## 2012-12-16 DIAGNOSIS — D649 Anemia, unspecified: Secondary | ICD-10-CM | POA: Diagnosis present

## 2012-12-16 DIAGNOSIS — K219 Gastro-esophageal reflux disease without esophagitis: Secondary | ICD-10-CM | POA: Diagnosis present

## 2012-12-16 DIAGNOSIS — Z888 Allergy status to other drugs, medicaments and biological substances status: Secondary | ICD-10-CM

## 2012-12-16 DIAGNOSIS — I10 Essential (primary) hypertension: Secondary | ICD-10-CM | POA: Diagnosis present

## 2012-12-16 DIAGNOSIS — R053 Chronic cough: Secondary | ICD-10-CM | POA: Diagnosis present

## 2012-12-16 DIAGNOSIS — Y9210 Unspecified residential institution as the place of occurrence of the external cause: Secondary | ICD-10-CM | POA: Diagnosis not present

## 2012-12-16 DIAGNOSIS — J9600 Acute respiratory failure, unspecified whether with hypoxia or hypercapnia: Secondary | ICD-10-CM | POA: Diagnosis not present

## 2012-12-16 DIAGNOSIS — Z794 Long term (current) use of insulin: Secondary | ICD-10-CM

## 2012-12-16 DIAGNOSIS — Z792 Long term (current) use of antibiotics: Secondary | ICD-10-CM

## 2012-12-16 DIAGNOSIS — D860 Sarcoidosis of lung: Secondary | ICD-10-CM | POA: Diagnosis present

## 2012-12-16 DIAGNOSIS — J99 Respiratory disorders in diseases classified elsewhere: Secondary | ICD-10-CM | POA: Diagnosis present

## 2012-12-16 DIAGNOSIS — E119 Type 2 diabetes mellitus without complications: Secondary | ICD-10-CM | POA: Diagnosis not present

## 2012-12-16 DIAGNOSIS — T368X5A Adverse effect of other systemic antibiotics, initial encounter: Secondary | ICD-10-CM | POA: Diagnosis not present

## 2012-12-16 DIAGNOSIS — J9620 Acute and chronic respiratory failure, unspecified whether with hypoxia or hypercapnia: Principal | ICD-10-CM | POA: Diagnosis present

## 2012-12-16 DIAGNOSIS — L989 Disorder of the skin and subcutaneous tissue, unspecified: Secondary | ICD-10-CM | POA: Diagnosis present

## 2012-12-16 DIAGNOSIS — Z8249 Family history of ischemic heart disease and other diseases of the circulatory system: Secondary | ICD-10-CM

## 2012-12-16 DIAGNOSIS — Z9089 Acquired absence of other organs: Secondary | ICD-10-CM

## 2012-12-16 DIAGNOSIS — E669 Obesity, unspecified: Secondary | ICD-10-CM | POA: Diagnosis not present

## 2012-12-16 DIAGNOSIS — E785 Hyperlipidemia, unspecified: Secondary | ICD-10-CM

## 2012-12-16 DIAGNOSIS — G4730 Sleep apnea, unspecified: Secondary | ICD-10-CM | POA: Diagnosis present

## 2012-12-16 DIAGNOSIS — IMO0002 Reserved for concepts with insufficient information to code with codable children: Secondary | ICD-10-CM

## 2012-12-16 DIAGNOSIS — R05 Cough: Secondary | ICD-10-CM | POA: Diagnosis not present

## 2012-12-16 DIAGNOSIS — I152 Hypertension secondary to endocrine disorders: Secondary | ICD-10-CM | POA: Diagnosis present

## 2012-12-16 DIAGNOSIS — Z90710 Acquired absence of both cervix and uterus: Secondary | ICD-10-CM

## 2012-12-16 DIAGNOSIS — J449 Chronic obstructive pulmonary disease, unspecified: Secondary | ICD-10-CM | POA: Diagnosis present

## 2012-12-16 DIAGNOSIS — R079 Chest pain, unspecified: Secondary | ICD-10-CM | POA: Diagnosis present

## 2012-12-16 DIAGNOSIS — E1169 Type 2 diabetes mellitus with other specified complication: Secondary | ICD-10-CM | POA: Diagnosis present

## 2012-12-16 DIAGNOSIS — E1129 Type 2 diabetes mellitus with other diabetic kidney complication: Secondary | ICD-10-CM | POA: Diagnosis present

## 2012-12-16 DIAGNOSIS — M109 Gout, unspecified: Secondary | ICD-10-CM | POA: Diagnosis present

## 2012-12-16 DIAGNOSIS — R599 Enlarged lymph nodes, unspecified: Secondary | ICD-10-CM | POA: Diagnosis present

## 2012-12-16 DIAGNOSIS — J9690 Respiratory failure, unspecified, unspecified whether with hypoxia or hypercapnia: Secondary | ICD-10-CM

## 2012-12-16 LAB — CBC
HCT: 33.8 % — ABNORMAL LOW (ref 36.0–46.0)
Hemoglobin: 11.3 g/dL — ABNORMAL LOW (ref 12.0–15.0)
MCH: 30.3 pg (ref 26.0–34.0)
MCHC: 33.4 g/dL (ref 30.0–36.0)
MCV: 90.6 fL (ref 78.0–100.0)
Platelets: 297 10*3/uL (ref 150–400)
RBC: 3.73 MIL/uL — ABNORMAL LOW (ref 3.87–5.11)
RDW: 13.7 % (ref 11.5–15.5)
WBC: 10.5 10*3/uL (ref 4.0–10.5)

## 2012-12-16 LAB — CBC WITH DIFFERENTIAL/PLATELET
Basophils Absolute: 0.1 10*3/uL (ref 0.0–0.1)
Basophils Relative: 1 % (ref 0–1)
Eosinophils Absolute: 0.3 10*3/uL (ref 0.0–0.7)
Eosinophils Relative: 2 % (ref 0–5)
HCT: 35.3 % — ABNORMAL LOW (ref 36.0–46.0)
Hemoglobin: 11.6 g/dL — ABNORMAL LOW (ref 12.0–15.0)
Lymphocytes Relative: 17 % (ref 12–46)
Lymphs Abs: 2.4 10*3/uL (ref 0.7–4.0)
MCH: 29.7 pg (ref 26.0–34.0)
MCHC: 32.9 g/dL (ref 30.0–36.0)
MCV: 90.5 fL (ref 78.0–100.0)
Monocytes Absolute: 0.7 10*3/uL (ref 0.1–1.0)
Monocytes Relative: 5 % (ref 3–12)
Neutro Abs: 10.8 10*3/uL — ABNORMAL HIGH (ref 1.7–7.7)
Neutrophils Relative %: 76 % (ref 43–77)
Platelets: 319 10*3/uL (ref 150–400)
RBC: 3.9 MIL/uL (ref 3.87–5.11)
RDW: 13.6 % (ref 11.5–15.5)
WBC: 14.2 10*3/uL — ABNORMAL HIGH (ref 4.0–10.5)

## 2012-12-16 LAB — COMPREHENSIVE METABOLIC PANEL
ALT: 21 U/L (ref 0–35)
AST: 22 U/L (ref 0–37)
Albumin: 3.7 g/dL (ref 3.5–5.2)
Alkaline Phosphatase: 61 U/L (ref 39–117)
BUN: 15 mg/dL (ref 6–23)
CO2: 23 mEq/L (ref 19–32)
Calcium: 9.3 mg/dL (ref 8.4–10.5)
Chloride: 99 mEq/L (ref 96–112)
Creatinine, Ser: 0.68 mg/dL (ref 0.50–1.10)
GFR calc Af Amer: >90 mL/min (ref >90)
GFR calc non Af Amer: >90 mL/min (ref >90)
Glucose, Bld: 183 mg/dL — ABNORMAL HIGH (ref 70–99)
Potassium: 4.3 mEq/L (ref 3.5–5.1)
Sodium: 136 mEq/L (ref 135–145)
Total Bilirubin: 0.3 mg/dL (ref 0.3–1.2)
Total Protein: 6.9 g/dL (ref 6.0–8.3)

## 2012-12-16 LAB — POCT I-STAT 3, ART BLOOD GAS (G3+)
Acid-base deficit: 2 mmol/L (ref 0.0–2.0)
Bicarbonate: 22.1 mEq/L (ref 20.0–24.0)
O2 Saturation: 96 %
Patient temperature: 98.7
TCO2: 23 mmol/L (ref 0–100)
pCO2 arterial: 33.3 mmHg — ABNORMAL LOW (ref 35.0–45.0)
pH, Arterial: 7.43 (ref 7.350–7.450)
pO2, Arterial: 81 mmHg (ref 80.0–100.0)

## 2012-12-16 LAB — GLUCOSE, CAPILLARY
Glucose-Capillary: 198 mg/dL — ABNORMAL HIGH (ref 70–99)
Glucose-Capillary: 243 mg/dL — ABNORMAL HIGH (ref 70–99)
Glucose-Capillary: 253 mg/dL — ABNORMAL HIGH (ref 70–99)
Glucose-Capillary: 329 mg/dL — ABNORMAL HIGH (ref 70–99)
Glucose-Capillary: 260 mg/dL — ABNORMAL HIGH (ref 70–99)

## 2012-12-16 LAB — CREATININE, SERUM
Creatinine, Ser: 0.55 mg/dL (ref 0.50–1.10)
GFR calc Af Amer: >90 mL/min (ref >90)
GFR calc non Af Amer: >90 mL/min (ref >90)

## 2012-12-16 LAB — POCT I-STAT TROPONIN I: Troponin i, poc: 0 ng/mL (ref 0.00–0.08)

## 2012-12-16 LAB — MRSA PCR SCREENING: MRSA by PCR: NEGATIVE

## 2012-12-16 MED ORDER — SODIUM CHLORIDE 0.9 % IV SOLN: 250.0000 mL | INTRAVENOUS | Status: DC | PRN

## 2012-12-16 MED ORDER — PAROXETINE HCL 10 MG PO TABS
10.0000 mg | ORAL_TABLET | Freq: Every day | ORAL | Status: DC
  Filled 2012-12-16: qty 1

## 2012-12-16 MED ORDER — PANTOPRAZOLE SODIUM 40 MG PO TBEC
40.0000 mg | DELAYED_RELEASE_TABLET | Freq: Every day | ORAL | Status: DC
  Administered 2012-12-16 – 2012-12-18 (×3): 40 mg via ORAL
  Filled 2012-12-16 (×3): qty 1

## 2012-12-16 MED ORDER — LEVOFLOXACIN IN D5W 500 MG/100ML IV SOLN
500.0000 mg | Freq: Once | INTRAVENOUS | Status: AC
  Administered 2012-12-16: 500 mg via INTRAVENOUS
  Filled 2012-12-16: qty 100

## 2012-12-16 MED ORDER — ONDANSETRON HCL 4 MG/2ML IJ SOLN
4.0000 mg | Freq: Four times a day (QID) | INTRAMUSCULAR | Status: DC | PRN
  Administered 2012-12-16: 4 mg via INTRAVENOUS
  Filled 2012-12-16: qty 2

## 2012-12-16 MED ORDER — INSULIN ASPART 100 UNIT/ML ~~LOC~~ SOLN
0.0000 [IU] | SUBCUTANEOUS | Status: DC
  Administered 2012-12-16: 5 [IU] via SUBCUTANEOUS
  Filled 2012-12-16: qty 1

## 2012-12-16 MED ORDER — METFORMIN HCL 500 MG PO TABS
1000.0000 mg | ORAL_TABLET | Freq: Two times a day (BID) | ORAL | Status: DC
  Administered 2012-12-16 – 2012-12-17 (×3): 1000 mg via ORAL
  Filled 2012-12-16 (×5): qty 2

## 2012-12-16 MED ORDER — BUDESONIDE 0.25 MG/2ML IN SUSP
0.2500 mg | Freq: Two times a day (BID) | RESPIRATORY_TRACT | Status: DC
  Administered 2012-12-16 – 2012-12-18 (×4): 0.25 mg via RESPIRATORY_TRACT
  Filled 2012-12-16 (×7): qty 2

## 2012-12-16 MED ORDER — ALBUTEROL SULFATE (5 MG/ML) 0.5% IN NEBU
2.5000 mg | INHALATION_SOLUTION | RESPIRATORY_TRACT | Status: DC | PRN
  Filled 2012-12-16: qty 0.5

## 2012-12-16 MED ORDER — ATENOLOL 50 MG PO TABS
50.0000 mg | ORAL_TABLET | Freq: Every day | ORAL | Status: DC
  Administered 2012-12-16 – 2012-12-18 (×3): 50 mg via ORAL
  Filled 2012-12-16 (×3): qty 1

## 2012-12-16 MED ORDER — SODIUM CHLORIDE 0.9 % IV SOLN
Freq: Once | INTRAVENOUS | Status: AC
  Administered 2012-12-16: 03:00:00 via INTRAVENOUS

## 2012-12-16 MED ORDER — IPRATROPIUM BROMIDE 0.02 % IN SOLN
0.5000 mg | Freq: Once | RESPIRATORY_TRACT | Status: AC
  Administered 2012-12-16: 0.5 mg via RESPIRATORY_TRACT
  Filled 2012-12-16: qty 2.5

## 2012-12-16 MED ORDER — PANTOPRAZOLE SODIUM 40 MG PO TBEC: 40.0000 mg | DELAYED_RELEASE_TABLET | Freq: Every day | ORAL | Status: DC

## 2012-12-16 MED ORDER — ALBUTEROL (5 MG/ML) CONTINUOUS INHALATION SOLN
10.0000 mg/h | INHALATION_SOLUTION | Freq: Once | RESPIRATORY_TRACT | Status: AC
  Administered 2012-12-16: 10 mg/h via RESPIRATORY_TRACT
  Filled 2012-12-16: qty 20

## 2012-12-16 MED ORDER — ONDANSETRON HCL 4 MG PO TABS: 4.0000 mg | ORAL_TABLET | Freq: Four times a day (QID) | ORAL | Status: DC | PRN

## 2012-12-16 MED ORDER — ATORVASTATIN CALCIUM 40 MG PO TABS
40.0000 mg | ORAL_TABLET | Freq: Every day | ORAL | Status: DC
  Administered 2012-12-16 – 2012-12-17 (×2): 40 mg via ORAL
  Filled 2012-12-16 (×3): qty 1

## 2012-12-16 MED ORDER — GLYBURIDE 2.5 MG PO TABS
2.5000 mg | ORAL_TABLET | Freq: Every day | ORAL | Status: DC
  Administered 2012-12-16 – 2012-12-18 (×3): 2.5 mg via ORAL
  Filled 2012-12-16 (×4): qty 1

## 2012-12-16 MED ORDER — INSULIN ASPART 100 UNIT/ML ~~LOC~~ SOLN
0.0000 [IU] | Freq: Three times a day (TID) | SUBCUTANEOUS | Status: DC
  Administered 2012-12-16: 8 [IU] via SUBCUTANEOUS
  Administered 2012-12-16: 11 [IU] via SUBCUTANEOUS
  Administered 2012-12-17: 8 [IU] via SUBCUTANEOUS
  Administered 2012-12-17: 11 [IU] via SUBCUTANEOUS
  Administered 2012-12-17: 5 [IU] via SUBCUTANEOUS
  Administered 2012-12-18: 3 [IU] via SUBCUTANEOUS

## 2012-12-16 MED ORDER — HYDROCOD POLST-CHLORPHEN POLST 10-8 MG/5ML PO LQCR: 5.0000 mL | Freq: Two times a day (BID) | ORAL | Status: DC | PRN

## 2012-12-16 MED ORDER — METHYLPREDNISOLONE SODIUM SUCC 125 MG IJ SOLR: 60.0000 mg | Freq: Once | INTRAMUSCULAR | Status: DC

## 2012-12-16 MED ORDER — VITAMIN D3 25 MCG (1000 UNIT) PO TABS
1000.0000 [IU] | ORAL_TABLET | Freq: Two times a day (BID) | ORAL | Status: DC
  Administered 2012-12-16 – 2012-12-18 (×5): 1000 [IU] via ORAL
  Filled 2012-12-16 (×7): qty 1

## 2012-12-16 MED ORDER — POTASSIUM CHLORIDE CRYS ER 20 MEQ PO TBCR
20.0000 meq | EXTENDED_RELEASE_TABLET | Freq: Three times a day (TID) | ORAL | Status: DC
  Administered 2012-12-16 – 2012-12-18 (×7): 20 meq via ORAL
  Filled 2012-12-16 (×10): qty 1

## 2012-12-16 MED ORDER — CICLESONIDE 50 MCG/ACT NA SUSP: 1.0000 | Freq: Every day | NASAL | Status: DC

## 2012-12-16 MED ORDER — INSULIN GLARGINE 100 UNIT/ML ~~LOC~~ SOLN
20.0000 [IU] | Freq: Every day | SUBCUTANEOUS | Status: DC
  Administered 2012-12-16 – 2012-12-17 (×2): 20 [IU] via SUBCUTANEOUS

## 2012-12-16 MED ORDER — METHYLPREDNISOLONE SODIUM SUCC 125 MG IJ SOLR
125.0000 mg | Freq: Once | INTRAMUSCULAR | Status: AC
  Administered 2012-12-16: 125 mg via INTRAVENOUS
  Filled 2012-12-16: qty 2

## 2012-12-16 MED ORDER — FENOFIBRATE 160 MG PO TABS
160.0000 mg | ORAL_TABLET | Freq: Every day | ORAL | Status: DC
  Administered 2012-12-16 – 2012-12-18 (×3): 160 mg via ORAL
  Filled 2012-12-16 (×3): qty 1

## 2012-12-16 MED ORDER — BUPROPION HCL ER (XL) 300 MG PO TB24
300.0000 mg | ORAL_TABLET | Freq: Every day | ORAL | Status: DC
  Administered 2012-12-16 – 2012-12-18 (×3): 300 mg via ORAL
  Filled 2012-12-16 (×3): qty 1

## 2012-12-16 MED ORDER — ASPIRIN EC 81 MG PO TBEC
81.0000 mg | DELAYED_RELEASE_TABLET | Freq: Every day | ORAL | Status: DC
  Administered 2012-12-16 – 2012-12-18 (×3): 81 mg via ORAL
  Filled 2012-12-16 (×3): qty 1

## 2012-12-16 MED ORDER — PAROXETINE MESYLATE 7.5 MG PO CAPS: 1.0000 | ORAL_CAPSULE | Freq: Every day | ORAL | Status: DC

## 2012-12-16 MED ORDER — SODIUM CHLORIDE 0.9 % IJ SOLN: 3.0000 mL | INTRAMUSCULAR | Status: DC | PRN

## 2012-12-16 MED ORDER — VENLAFAXINE HCL ER 150 MG PO CP24
150.0000 mg | ORAL_CAPSULE | Freq: Every day | ORAL | Status: DC
  Administered 2012-12-16 – 2012-12-17 (×2): 150 mg via ORAL
  Filled 2012-12-16 (×4): qty 1

## 2012-12-16 MED ORDER — ALBUTEROL SULFATE (5 MG/ML) 0.5% IN NEBU
2.5000 mg | INHALATION_SOLUTION | Freq: Four times a day (QID) | RESPIRATORY_TRACT | Status: DC
  Administered 2012-12-16 – 2012-12-17 (×8): 2.5 mg via RESPIRATORY_TRACT
  Filled 2012-12-16 (×7): qty 0.5

## 2012-12-16 MED ORDER — ENOXAPARIN SODIUM 40 MG/0.4ML ~~LOC~~ SOLN
40.0000 mg | Freq: Every day | SUBCUTANEOUS | Status: DC
  Administered 2012-12-16 – 2012-12-18 (×3): 40 mg via SUBCUTANEOUS
  Filled 2012-12-16 (×3): qty 0.4

## 2012-12-16 MED ORDER — SODIUM CHLORIDE 0.9 % IJ SOLN: 3.0000 mL | Freq: Two times a day (BID) | INTRAMUSCULAR | Status: DC

## 2012-12-16 MED ORDER — LEVOFLOXACIN IN D5W 500 MG/100ML IV SOLN
500.0000 mg | INTRAVENOUS | Status: DC
  Administered 2012-12-17: 500 mg via INTRAVENOUS
  Filled 2012-12-16 (×2): qty 100

## 2012-12-16 MED ORDER — SODIUM CHLORIDE 0.9 % IJ SOLN
3.0000 mL | Freq: Two times a day (BID) | INTRAMUSCULAR | Status: DC
  Administered 2012-12-16: 3 mL via INTRAVENOUS

## 2012-12-16 MED ORDER — PAROXETINE HCL 10 MG PO TABS
10.0000 mg | ORAL_TABLET | Freq: Every day | ORAL | Status: DC
  Administered 2012-12-16 – 2012-12-17 (×2): 10 mg via ORAL
  Filled 2012-12-16 (×4): qty 1

## 2012-12-16 MED ORDER — ACETAMINOPHEN 325 MG PO TABS
650.0000 mg | ORAL_TABLET | Freq: Four times a day (QID) | ORAL | Status: DC | PRN
  Administered 2012-12-16 – 2012-12-17 (×3): 650 mg via ORAL
  Filled 2012-12-16 (×3): qty 2

## 2012-12-16 MED ORDER — LOSARTAN POTASSIUM 50 MG PO TABS
50.0000 mg | ORAL_TABLET | Freq: Every day | ORAL | Status: DC
  Administered 2012-12-16 – 2012-12-17 (×2): 50 mg via ORAL
  Filled 2012-12-16 (×4): qty 1

## 2012-12-16 MED ORDER — MUPIROCIN 2 % EX OINT
TOPICAL_OINTMENT | Freq: Three times a day (TID) | CUTANEOUS | Status: DC
  Administered 2012-12-16: 10:00:00 via TOPICAL
  Filled 2012-12-16: qty 22

## 2012-12-16 MED ORDER — ALBUTEROL SULFATE (5 MG/ML) 0.5% IN NEBU
5.0000 mg | INHALATION_SOLUTION | Freq: Once | RESPIRATORY_TRACT | Status: AC
  Administered 2012-12-16: 5 mg via RESPIRATORY_TRACT
  Filled 2012-12-16: qty 1

## 2012-12-16 MED ORDER — KETOROLAC TROMETHAMINE 30 MG/ML IJ SOLN
15.0000 mg | Freq: Once | INTRAMUSCULAR | Status: AC
  Administered 2012-12-16: 15 mg via INTRAVENOUS
  Filled 2012-12-16: qty 1

## 2012-12-16 MED ORDER — MOMETASONE FURO-FORMOTEROL FUM 200-5 MCG/ACT IN AERO
2.0000 | INHALATION_SPRAY | Freq: Two times a day (BID) | RESPIRATORY_TRACT | Status: DC
  Administered 2012-12-16 – 2012-12-18 (×4): 2 via RESPIRATORY_TRACT
  Filled 2012-12-16 (×2): qty 8.8

## 2012-12-16 MED ORDER — METHYLPREDNISOLONE SODIUM SUCC 125 MG IJ SOLR
60.0000 mg | INTRAMUSCULAR | Status: DC
  Administered 2012-12-16 (×2): via INTRAVENOUS
  Administered 2012-12-16 – 2012-12-17 (×5): 60 mg via INTRAVENOUS
  Administered 2012-12-17 (×2): 62.5 mg via INTRAVENOUS
  Filled 2012-12-16: qty 0.96
  Filled 2012-12-16: qty 2
  Filled 2012-12-16 (×11): qty 0.96

## 2012-12-16 MED ORDER — FUROSEMIDE 20 MG PO TABS
20.0000 mg | ORAL_TABLET | Freq: Two times a day (BID) | ORAL | Status: DC
  Administered 2012-12-16 – 2012-12-18 (×5): 20 mg via ORAL
  Filled 2012-12-16 (×8): qty 1

## 2012-12-16 MED ORDER — LORAZEPAM 1 MG PO TABS
1.0000 mg | ORAL_TABLET | ORAL | Status: DC | PRN
  Administered 2012-12-17: 1 mg via ORAL
  Filled 2012-12-16: qty 1

## 2012-12-16 MED ORDER — OXYCODONE HCL 5 MG PO TABS
5.0000 mg | ORAL_TABLET | ORAL | Status: DC | PRN
  Administered 2012-12-17 (×2): 10 mg via ORAL
  Filled 2012-12-16 (×2): qty 2

## 2012-12-16 MED ORDER — ESTRADIOL 2 MG PO TABS
2.0000 mg | ORAL_TABLET | Freq: Every day | ORAL | Status: DC
  Administered 2012-12-16 – 2012-12-18 (×3): 2 mg via ORAL
  Filled 2012-12-16 (×3): qty 1

## 2012-12-16 MED ORDER — AZELASTINE HCL 0.1 % NA SOLN
1.0000 | Freq: Every day | NASAL | Status: DC
  Administered 2012-12-16 – 2012-12-18 (×3): 1 via NASAL
  Filled 2012-12-16: qty 30

## 2012-12-16 MED ORDER — ALLOPURINOL 100 MG PO TABS
100.0000 mg | ORAL_TABLET | Freq: Every day | ORAL | Status: DC
  Administered 2012-12-16 – 2012-12-18 (×3): 100 mg via ORAL
  Filled 2012-12-16 (×3): qty 1
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
## 2012-12-16 NOTE — Progress Notes (Signed)
Pt states shes feeling better. Taken off BIPAP and placed on 2lpm [place]. Pt in no obvious distress at this time, able to speak in full sentences, no wheeze heard at this time. RT will continue to monitor.

## 2012-12-16 NOTE — ED Notes (Signed)
Report given by Ladona Ridgel, RN, pt. Ready for transfer.
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
## 2012-12-16 NOTE — Progress Notes (Signed)
Pt transported to 2900 without incidence. RT will continue to monitor.

## 2012-12-17 ENCOUNTER — Inpatient Hospital Stay (HOSPITAL_COMMUNITY): Payer: BC Managed Care – PPO

## 2012-12-17 DIAGNOSIS — R0602 Shortness of breath: Secondary | ICD-10-CM | POA: Diagnosis not present

## 2012-12-17 DIAGNOSIS — R079 Chest pain, unspecified: Secondary | ICD-10-CM | POA: Diagnosis not present

## 2012-12-17 DIAGNOSIS — D869 Sarcoidosis, unspecified: Secondary | ICD-10-CM | POA: Diagnosis not present

## 2012-12-17 LAB — GLUCOSE, CAPILLARY
Glucose-Capillary: 270 mg/dL — ABNORMAL HIGH (ref 70–99)
Glucose-Capillary: 311 mg/dL — ABNORMAL HIGH (ref 70–99)
Glucose-Capillary: 224 mg/dL — ABNORMAL HIGH (ref 70–99)
Glucose-Capillary: 363 mg/dL — ABNORMAL HIGH (ref 70–99)

## 2012-12-17 LAB — CBC
HCT: 33.2 % — ABNORMAL LOW (ref 36.0–46.0)
Hemoglobin: 11 g/dL — ABNORMAL LOW (ref 12.0–15.0)
MCH: 30.3 pg (ref 26.0–34.0)
MCHC: 33.1 g/dL (ref 30.0–36.0)
MCV: 91.5 fL (ref 78.0–100.0)
Platelets: 308 10*3/uL (ref 150–400)
RBC: 3.63 MIL/uL — ABNORMAL LOW (ref 3.87–5.11)
RDW: 13.5 % (ref 11.5–15.5)
WBC: 15.8 10*3/uL — ABNORMAL HIGH (ref 4.0–10.5)

## 2012-12-17 LAB — TROPONIN I
Troponin I: <0.3 ng/mL (ref <0.30)
Troponin I: <0.3 ng/mL (ref <0.30)

## 2012-12-17 MED ORDER — IOHEXOL 350 MG/ML SOLN
70.0000 mL | Freq: Once | INTRAVENOUS | Status: AC | PRN
  Administered 2012-12-17: 70 mL via INTRAVENOUS

## 2012-12-17 MED ORDER — LEVOFLOXACIN 500 MG PO TABS
500.0000 mg | ORAL_TABLET | Freq: Every day | ORAL | Status: DC
  Administered 2012-12-18: 500 mg via ORAL
  Filled 2012-12-17: qty 1

## 2012-12-17 MED ORDER — GI COCKTAIL ~~LOC~~
30.0000 mL | Freq: Two times a day (BID) | ORAL | Status: DC | PRN
  Administered 2012-12-17: 30 mL via ORAL
  Filled 2012-12-17: qty 30

## 2012-12-17 MED ORDER — PREDNISONE 20 MG PO TABS
20.0000 mg | ORAL_TABLET | Freq: Every day | ORAL | Status: DC
  Administered 2012-12-17 – 2012-12-18 (×2): 20 mg via ORAL
  Filled 2012-12-17 (×4): qty 1

## 2012-12-17 MED ORDER — ALBUTEROL SULFATE (5 MG/ML) 0.5% IN NEBU
2.5000 mg | INHALATION_SOLUTION | Freq: Two times a day (BID) | RESPIRATORY_TRACT | Status: DC
  Administered 2012-12-18: 2.5 mg via RESPIRATORY_TRACT
  Filled 2012-12-17: qty 0.5

## 2012-12-17 MED ORDER — METHYLPREDNISOLONE SODIUM SUCC 125 MG IJ SOLR
60.0000 mg | INTRAMUSCULAR | Status: AC
  Administered 2012-12-17: 60 mg via INTRAVENOUS

## 2012-12-17 NOTE — Evaluation (Signed)
Physical Therapy Evaluation Patient Details Name: [name] MRN: [id number] DOB: 24-May-1961 Today's Date: 12/17/2012 Time: 1449-1500 PT Time Calculation (min): 11 min  PT Assessment / Plan / Recommendation Clinical Impression  pt presents with Sarcoidosis and COPD Exac.  pt moving well, just deconditioned.  Encouraged pt to walk halls multiple times per day to build endurance.  No further PT needs at this time.      PT Assessment  Patent does not need any further PT services    Follow Up Recommendations  No PT follow up    Does the patient have the potential to tolerate intense rehabilitation      Barriers to Discharge        Equipment Recommendations  None recommended by PT    Recommendations for Other Services     Frequency      Precautions / Restrictions Precautions Precautions: None Restrictions Weight Bearing Restrictions: No   Pertinent Vitals/Pain Denies pain.  O2 sats 99% after ambulating.        Mobility  Bed Mobility Bed Mobility: Not assessed Transfers Transfers: Stand to Sit;Sit to Stand Sit to Stand: 6: Modified independent (Device/Increase time);With upper extremity assist;From bed Stand to Sit: 6: Modified independent (Device/Increase time);With upper extremity assist;To bed Details for Transfer Assistance: pt moves slowly, but safely.   Ambulation/Gait Ambulation/Gait Assistance: 6: Modified independent (Device/Increase time) Ambulation Distance (Feet): 200 Feet Assistive device: None Ambulation/Gait Assistance Details: pt moves slowly and needs cues for pursed lip breathing only.  pt's O2 sats 99% on RA after gait.   Gait Pattern: Step-through pattern;Decreased stride length Stairs: No Wheelchair Mobility Wheelchair Mobility: No    Exercises     PT Diagnosis:    PT Problem List:   PT Treatment Interventions:     PT Goals    Visit Information  Last PT Received On: 12/17/12 Assistance Needed: +1    Subjective Data  Subjective:  I've been up in my room, but not down the hall.   Patient Stated Goal: Home   Prior Functioning  Home Living Lives With: Spouse Available Help at Discharge: Family;Available PRN/intermittently Type of Home: House Home Access: Stairs to enter [employer] of Steps: 2 Entrance Stairs-Rails: Right Home Layout: One level Bathroom Toilet: Standard Home Adaptive Equipment: None Additional Comments: pt notes she often stays at her parent's house nextdoor so she isn't alone while her husband is at work.   Prior Function Level of Independence: Needs assistance Needs Assistance: Light Housekeeping;Meal Prep Meal Prep: Minimal Light Housekeeping: Minimal Able to Take Stairs?: Yes Communication Communication: No difficulties    Cognition  Cognition Overall Cognitive Status: Appears within functional limits for tasks assessed/performed Arousal/Alertness: Awake/alert Orientation Level: Appears intact for tasks assessed Behavior During Session: [facility] for tasks performed    Extremity/Trunk Assessment Right Lower Extremity Assessment RLE ROM/Strength/Tone: WFL for tasks assessed RLE Sensation: WFL - Light Touch Left Lower Extremity Assessment LLE ROM/Strength/Tone: WFL for tasks assessed LLE Sensation: WFL - Light Touch Trunk Assessment Trunk Assessment: Normal   Balance Balance Balance Assessed: No  End of Session PT - End of Session Equipment Utilized During Treatment: Gait belt Activity Tolerance: Patient tolerated treatment well Patient left: in bed;with call bell/phone within reach;with family/visitor present (Sitting EOB) Nurse Communication: Mobility status  GP     Sunny Schlein, PT [phone] 12/17/2012, 3:53 PM
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
## 2012-12-18 ENCOUNTER — Telehealth: Payer: Self-pay | Admitting: Critical Care Medicine

## 2012-12-18 DIAGNOSIS — K219 Gastro-esophageal reflux disease without esophagitis: Secondary | ICD-10-CM | POA: Diagnosis not present

## 2012-12-18 DIAGNOSIS — J9600 Acute respiratory failure, unspecified whether with hypoxia or hypercapnia: Secondary | ICD-10-CM | POA: Diagnosis not present

## 2012-12-18 DIAGNOSIS — E119 Type 2 diabetes mellitus without complications: Secondary | ICD-10-CM | POA: Diagnosis not present

## 2012-12-18 DIAGNOSIS — D869 Sarcoidosis, unspecified: Secondary | ICD-10-CM | POA: Diagnosis not present

## 2012-12-18 LAB — GLUCOSE, CAPILLARY
Glucose-Capillary: 152 mg/dL — ABNORMAL HIGH (ref 70–99)
Glucose-Capillary: 204 mg/dL — ABNORMAL HIGH (ref 70–99)

## 2012-12-18 MED ORDER — PREDNISONE 20 MG PO TABS: 20.0000 mg | ORAL_TABLET | Freq: Every day | ORAL | Status: DC

## 2012-12-18 MED ORDER — LEVOFLOXACIN 500 MG PO TABS: 500.0000 mg | ORAL_TABLET | Freq: Every day | ORAL | Status: DC
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
## 2012-12-18 NOTE — Telephone Encounter (Signed)
LMTCB  Per her discharge summary from the hospital it states that she is to follow up with [facility] pulmonary.
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
## 2012-12-18 NOTE — Progress Notes (Signed)
Discharge instructions reviewed with pt and prescription given.  Pt verbalized understanding and had no questions.  Pt discharged in stable condition via wheelchair with family.  Moss, Jacquelyn Lindsay   

## 2012-12-19 NOTE — Telephone Encounter (Signed)
Spoke with patients spouse, informed him of discharge f/u instruction per discharge papers. Spouse verbalized understanding and nothing further needed at this time.  Recommendations for Outpatient Follow-up:  Please followup with your pulmonologist at [facility] in 1-2 weeks after discharge.  Please resume azithromycin at discharge.  Please take predisone 20 mg daily and after finishing the tablets please resume home prednisone dose.  Please home metformin till noon of 12/19/2012. You received IV contrast on 12/17/2012 which can interact with metformin.

## 2012-12-21 ENCOUNTER — Telehealth: Payer: Self-pay | Admitting: Internal Medicine

## 2012-12-21 NOTE — Telephone Encounter (Signed)
Pt was discharged last Mon from the hosp for chest congestion.  Pt would ike to see a MD before her scheduled appt Ilka Lovick 8  due to the fact pt has an going non productive cough.  Pt's husband states this appt is not so much a hosp follow up, its more because she has this bad cough. But its the cough that took her to the hospital to begin with. Hospital suggested she follow up ASAP w/ PCP.   Is it ok to schedule w/ Adline Mango so she can be seen?

## 2012-12-21 NOTE — Telephone Encounter (Signed)
Ok to see Padonda.  Dr Cato Mulligan is out for a week

## 2012-12-26 NOTE — Telephone Encounter (Signed)
Pt would like to wait until her 4/8 appt w/ Dr Cato Mulligan. She will call if she needs to come in before.

## 2012-12-28 ENCOUNTER — Other Ambulatory Visit: Payer: Self-pay | Admitting: *Deleted

## 2012-12-28 MED ORDER — DEXLANSOPRAZOLE 60 MG PO CPDR: 60.0000 mg | DELAYED_RELEASE_CAPSULE | Freq: Every day | ORAL | Status: DC

## 2013-01-01 DIAGNOSIS — D869 Sarcoidosis, unspecified: Secondary | ICD-10-CM | POA: Diagnosis not present

## 2013-01-09 DIAGNOSIS — L8990 Pressure ulcer of unspecified site, unspecified stage: Secondary | ICD-10-CM | POA: Diagnosis not present

## 2013-01-16 ENCOUNTER — Encounter: Payer: Self-pay | Admitting: Internal Medicine

## 2013-01-16 ENCOUNTER — Ambulatory Visit (INDEPENDENT_AMBULATORY_CARE_PROVIDER_SITE_OTHER): Payer: BC Managed Care – PPO | Admitting: Internal Medicine

## 2013-01-16 VITALS — BP 158/84 | HR 88 | Temp 98.4°F | Wt 271.0 lb

## 2013-01-16 DIAGNOSIS — E1159 Type 2 diabetes mellitus with other circulatory complications: Secondary | ICD-10-CM

## 2013-01-16 DIAGNOSIS — J99 Respiratory disorders in diseases classified elsewhere: Secondary | ICD-10-CM | POA: Diagnosis not present

## 2013-01-16 DIAGNOSIS — E669 Obesity, unspecified: Secondary | ICD-10-CM | POA: Diagnosis not present

## 2013-01-16 DIAGNOSIS — E785 Hyperlipidemia, unspecified: Secondary | ICD-10-CM | POA: Diagnosis not present

## 2013-01-16 DIAGNOSIS — E119 Type 2 diabetes mellitus without complications: Secondary | ICD-10-CM

## 2013-01-16 DIAGNOSIS — D869 Sarcoidosis, unspecified: Secondary | ICD-10-CM | POA: Diagnosis not present

## 2013-01-16 DIAGNOSIS — I10 Essential (primary) hypertension: Secondary | ICD-10-CM

## 2013-01-16 DIAGNOSIS — D860 Sarcoidosis of lung: Secondary | ICD-10-CM

## 2013-01-16 LAB — HEPATIC FUNCTION PANEL
ALT: 23 U/L (ref 0–35)
AST: 19 U/L (ref 0–37)
Albumin: 3.7 g/dL (ref 3.5–5.2)
Alkaline Phosphatase: 61 U/L (ref 39–117)
Bilirubin, Direct: 0.1 mg/dL (ref 0.0–0.3)
Total Bilirubin: 0.5 mg/dL (ref 0.3–1.2)
Total Protein: 6.8 g/dL (ref 6.0–8.3)

## 2013-01-16 LAB — BASIC METABOLIC PANEL
BUN: 16 mg/dL (ref 6–23)
CO2: 27 mEq/L (ref 19–32)
Calcium: 9.3 mg/dL (ref 8.4–10.5)
Chloride: 97 mEq/L (ref 96–112)
Creatinine, Ser: 0.6 mg/dL (ref 0.4–1.2)
GFR: 120.15 mL/min (ref >60.00)
Glucose, Bld: 180 mg/dL — ABNORMAL HIGH (ref 70–99)
Potassium: 4.3 mEq/L (ref 3.5–5.1)
Sodium: 138 mEq/L (ref 135–145)

## 2013-01-16 LAB — CBC WITH DIFFERENTIAL/PLATELET
Basophils Absolute: 0 10*3/uL (ref 0.0–0.1)
Basophils Relative: 0.3 % (ref 0.0–3.0)
Eosinophils Absolute: 0.2 10*3/uL (ref 0.0–0.7)
Eosinophils Relative: 1.5 % (ref 0.0–5.0)
HCT: 34.9 % — ABNORMAL LOW (ref 36.0–46.0)
Hemoglobin: 11.7 g/dL — ABNORMAL LOW (ref 12.0–15.0)
Lymphocytes Relative: 26.6 % (ref 12.0–46.0)
Lymphs Abs: 4 10*3/uL (ref 0.7–4.0)
MCHC: 33.5 g/dL (ref 30.0–36.0)
MCV: 90.5 fl (ref 78.0–100.0)
Monocytes Absolute: 0.8 10*3/uL (ref 0.1–1.0)
Monocytes Relative: 5.3 % (ref 3.0–12.0)
Neutro Abs: 10 10*3/uL — ABNORMAL HIGH (ref 1.4–7.7)
Neutrophils Relative %: 66.3 % (ref 43.0–77.0)
Platelets: 367 10*3/uL (ref 150.0–400.0)
RBC: 3.86 Mil/uL — ABNORMAL LOW (ref 3.87–5.11)
RDW: 14.3 % (ref 11.5–14.6)
WBC: 15 10*3/uL — ABNORMAL HIGH (ref 4.5–10.5)

## 2013-01-16 LAB — TSH: TSH: 2.92 u[IU]/mL (ref 0.35–5.50)

## 2013-01-16 LAB — LIPID PANEL
Cholesterol: 166 mg/dL (ref 0–200)
HDL: 36.9 mg/dL — ABNORMAL LOW (ref >39.00)
Total CHOL/HDL Ratio: 4
Triglycerides: 489 mg/dL — ABNORMAL HIGH (ref 0.0–149.0)
VLDL: 97.8 mg/dL — ABNORMAL HIGH (ref 0.0–40.0)

## 2013-01-16 LAB — LDL CHOLESTEROL, DIRECT: Direct LDL: 74 mg/dL

## 2013-01-16 LAB — HEMOGLOBIN A1C: Hgb A1c MFr Bld: 8.3 % — ABNORMAL HIGH (ref 4.6–6.5)

## 2013-01-16 NOTE — Progress Notes (Signed)
Very complicated patient Known sarcoid followed at    . She required hospitalization  Lipids- tolerating meds  htn-- no home bps, tolerating meds  DM-- note weight. She is taking meds and monitoring home bps. She needs f/u labs Lab Results  Component Value Date   HGBA1C 8.0* 08/16/2012   Past Medical History  Diagnosis Date  . Sarcoidosis 04/10/2007  . DIABETES MELLITUS, TYPE II 08/21/2006  . HYPERLIPIDEMIA 08/21/2006  . GOUT 08/20/2010  . OBESITY 06/04/2009  . ANEMIA-UNSPECIFIED 09/18/2009  . HYPERTENSION 08/21/2006  . GERD 08/21/2006  . Shortness of breath   . SLEEP APNEA 04/21/2009    last testing was negative  . Internal hemorrhoids     History   Social History  . Marital Status: Married    Spouse Name: N/A    Number of Children: N/A  . Years of Education: N/A   Occupational History  . DISABLED    Social History Main Topics  . Smoking status: Never Smoker   . Smokeless tobacco: Never Used  . Alcohol Use: No  . Drug Use: No  . Sexually Active: Yes   Other Topics Concern  . Not on file   Social History Narrative  . No narrative on file    Past Surgical History  Procedure Laterality Date  . Ventral hernia repair    . Nissen fundoplication    . Cholecystectomy    . Abdominal hysterectomy    . Knee arthroscopy      right  . Tubal ligation    . Bladder suspension  11/11/2011    Procedure: TRANSVAGINAL TAPE (TVT) PROCEDURE;  Surgeon: Levi Aland, MD;  Location: [facility];  Service: Gynecology;  Laterality: N/A;  . Cystoscopy  11/11/2011    Procedure: CYSTOSCOPY;  Surgeon: Levi Aland, MD;  Location: [facility];  Service: Gynecology;  Laterality: N/A;    Family History  Problem Relation Age of Onset  . Diabetes Father   . Heart attack Father   . Coronary artery disease Father   . Heart failure Father   . COPD Mother   . Emphysema Mother   . Asthma Mother   . Heart failure Mother   . Sarcoidosis Maternal Uncle   . Colon cancer Neg Hx   . Lung  cancer Brother   . Cancer Brother     Allergies  Allergen Reactions  . Lisinopril Cough  . Methotrexate     REACTION: peri-oral and buccal lesions.    Current Outpatient Prescriptions on File Prior to Visit  Medication Sig Dispense Refill  . albuterol (PROVENTIL HFA;VENTOLIN HFA) 108 (90 BASE) MCG/ACT inhaler Inhale 2 puffs into the lungs every 6 (six) hours as needed. For wheezing.      [name] albuterol (PROVENTIL) (5 MG/ML) 0.5% nebulizer solution Take 2.5 mg by nebulization every 6 (six) hours as needed. For wheezing.      [name] allopurinol (ZYLOPRIM) 100 MG tablet Take 100 mg by mouth daily.      [name] aspirin EC 81 MG tablet Take 81 mg by mouth daily.      [name] atenolol (TENORMIN) 50 MG tablet Take 50 mg by mouth daily.      [name] atorvastatin (LIPITOR) 40 MG tablet TAKE ONE TABLET BY MOUTH ONE TIME DAILY  90 tablet  1  . azithromycin (ZITHROMAX) 250 MG tablet Take 250 mg by mouth every Monday, Wednesday, and Friday. Prescribed by [facility] For bronchitis/respiratory infection prevention      . benzonatate (TESSALON) 100 MG capsule  Take 1 capsule (100 mg total) by mouth every 6 (six) hours as needed for cough.  60 capsule  4  . buPROPion (WELLBUTRIN XL) 300 MG 24 hr tablet TAKE ONE TABLET BY MOUTH ONE TIME DAILY  30 tablet  5  . chlorpheniramine-HYDROcodone (TUSSIONEX) 10-8 MG/5ML LQCR Take 10 mLs by mouth every 12 (twelve) hours as needed. For cough.  240 mL  0  . cholecalciferol (VITAMIN D) 1000 UNITS tablet Take 1,000 Units by mouth 2 (two) times daily.      . ciclesonide (OMNARIS) 50 MCG/ACT nasal spray Place 1 spray into both nostrils daily.       [name] dexlansoprazole (DEXILANT) 60 MG capsule Take 1 capsule (60 mg total) by mouth daily.  30 capsule  6  . estradiol (ESTRACE) 2 MG tablet Take 2 mg by mouth daily.      . fenofibrate (TRICOR) 145 MG tablet Take 145 mg by mouth daily.        . furosemide (LASIX) 20 MG tablet Take 1 tablet (20 mg total) by mouth 2 (two) times daily.  60 tablet  4  . glyBURIDE  (DIABETA) 2.5 MG tablet TAKE 1 TABLET (2.5 MG TOTAL)  BY MOUTH DAILY WITH BREAKFAST.  30 tablet  4  . HYDROcodone-homatropine (HYDROMET) 5-1.5 MG/5ML syrup Take by mouth every 6 (six) hours as needed for cough.      . insulin glargine (LANTUS SOLOSTAR) 100 UNIT/ML injection Inject 20 Units into the skin at bedtime.  1 pen  3  . Insulin Pen Needle 31G X 8 MM MISC 1 each by Does not apply route daily.  30 each  3  . LORazepam (ATIVAN) 1 MG tablet Take 1 mg by mouth every 8 (eight) hours as needed. For anxiety.      [name] losartan (COZAAR) 50 MG tablet Take 50 mg by mouth at bedtime.      . metFORMIN (GLUCOPHAGE) 1000 MG tablet Take 1 tablet (1,000 mg total) by mouth 2 (two) times daily with a meal.  180 tablet  3  . Mometasone Furo-Formoterol Fum (DULERA) 200-5 MCG/ACT AERO Inhale 2 puffs into the lungs 2 (two) times daily.  8.8 g  1  . mupirocin ointment (BACTROBAN) 2 % Apply topically 3 (three) times daily.  22 g  0  . nitroGLYCERIN (NITROSTAT) 0.4 MG SL tablet Place 0.4 mg under the tongue every 5 (five) minutes as needed. For chest pain.      [name] omeprazole (PRILOSEC) 20 MG capsule Take 20 mg by mouth at bedtime.      [name] PARoxetine Mesylate (BRISDELLE) 7.5 MG CAPS Take 1 capsule by mouth daily.      . potassium chloride SA (K-DUR,KLOR-CON) 20 MEQ tablet Take 20 mEq by mouth 3 (three) times daily.      . predniSONE (DELTASONE) 20 MG tablet Take 1 tablet (20 mg total) by mouth daily with breakfast. Please resume home prednisone after completing this course of prednisone.  4 tablet  0  . predniSONE (DELTASONE) 5 MG tablet Take 12.5 mg by mouth daily.      [name] venlafaxine XR (EFFEXOR-XR) 75 MG 24 hr capsule Take 150 mg by mouth at bedtime.        No current facility-administered medications on file prior to visit.     patient denies chest pain, shortness of breath, orthopnea. Denies lower extremity edema, abdominal pain, change in appetite, change in bowel movements. Patient denies rashes, musculoskeletal  complaints. No other specific complaints in a complete  review of systems.   There were no vitals taken for this visit.  obese female in no acute distress. HEENT exam atraumatic, normocephalic, extraocular muscles are intact. Neck is supple. No jugular venous distention no thyromegaly. Chest clear to auscultation without increased work of breathing. Cardiac exam S1 and S2 are regular. Abdominal exam active bowel sounds, soft, nontender. Extremities no edema. Neurologic exam she is alert without any motor sensory deficits. Gait is normal.

## 2013-01-16 NOTE — Assessment & Plan Note (Signed)
Adequate control Continue meds 

## 2013-01-16 NOTE — Assessment & Plan Note (Signed)
She has f/u with pulmonary

## 2013-01-16 NOTE — Assessment & Plan Note (Signed)
Check labs today Discussed need for aggressive weight loss

## 2013-01-16 NOTE — Assessment & Plan Note (Signed)
Lipid Panel     Component Value Date/Time   CHOL 149 08/16/2012 0945   TRIG 422.0 Triglyceride is over 400; calculations on Lipids are invalid.* 08/16/2012 0945   HDL 36.40* 08/16/2012 0945   CHOLHDL 4 08/16/2012 0945   VLDL 84.4* 08/16/2012 0945   LDLCALC UNABLE TO CALCULATE IF TRIGLYCERIDE OVER 400 mg/dL 1/61/0960 4540   Previously uncontrolled Check labs Continue meds

## 2013-01-16 NOTE — Assessment & Plan Note (Signed)
She needs to lose weight Daily exercise Low calorie diet

## 2013-01-23 ENCOUNTER — Other Ambulatory Visit: Payer: Self-pay | Admitting: *Deleted

## 2013-01-23 MED ORDER — LORAZEPAM 1 MG PO TABS: 1.0000 mg | ORAL_TABLET | Freq: Three times a day (TID) | ORAL | Status: DC | PRN

## 2013-01-23 MED ORDER — LORAZEPAM 1 MG PO TABS: 1.0000 mg | ORAL_TABLET | Freq: Two times a day (BID) | ORAL | Status: DC

## 2013-02-01 ENCOUNTER — Encounter: Payer: Self-pay | Admitting: Family Medicine

## 2013-02-01 ENCOUNTER — Ambulatory Visit (INDEPENDENT_AMBULATORY_CARE_PROVIDER_SITE_OTHER): Payer: BC Managed Care – PPO | Admitting: Family Medicine

## 2013-02-01 VITALS — BP 138/80 | HR 84 | Temp 97.8°F | Wt 272.0 lb

## 2013-02-01 DIAGNOSIS — B9789 Other viral agents as the cause of diseases classified elsewhere: Secondary | ICD-10-CM | POA: Diagnosis not present

## 2013-02-01 DIAGNOSIS — B349 Viral infection, unspecified: Secondary | ICD-10-CM

## 2013-02-01 NOTE — Patient Instructions (Signed)
-   plenty of fluids with electrolytes - can do soup broth, bland foods  -no dairy while sick with diarrhea  -can use imodium for the diarrhea  -can use afrin for the nasal congestion for 3-4 days then STOP  -follow up as needed

## 2013-02-01 NOTE — Progress Notes (Signed)
Chief Complaint  Patient presents with  . stomach pain and cramps    diarrhea x Sunday     HPI:  Acute visit for diarrhea: -about 3-4 days -symptoms: nasal congestion, ears stopped up, cough, drainage in throat, watery diarrhea - several times per day, nausea -denies: fevers, vomiting, hematochezia, severe abd pain, mucus in stools -has pollen allergies - takes nasaonex for this -sick contacts: not that she knows of  ROS: See pertinent positives and negatives per HPI.  Past Medical History  Diagnosis Date  . Sarcoidosis 04/10/2007  . DIABETES MELLITUS, TYPE II 08/21/2006  . HYPERLIPIDEMIA 08/21/2006  . GOUT 08/20/2010  . OBESITY 06/04/2009  . ANEMIA-UNSPECIFIED 09/18/2009  . HYPERTENSION 08/21/2006  . GERD 08/21/2006  . Shortness of breath   . SLEEP APNEA 04/21/2009    last testing was negative  . Internal hemorrhoids     Family History  Problem Relation Age of Onset  . Diabetes Father   . Heart attack Father   . Coronary artery disease Father   . Heart failure Father   . COPD Mother   . Emphysema Mother   . Asthma Mother   . Heart failure Mother   . Sarcoidosis Maternal Uncle   . Colon cancer Neg Hx   . Lung cancer Brother   . Cancer Brother     History   Social History  . Marital Status: Married    Spouse Name: N/A    Number of Children: N/A  . Years of Education: N/A   Occupational History  . DISABLED    Social History Main Topics  . Smoking status: Never Smoker   . Smokeless tobacco: Never Used  . Alcohol Use: No  . Drug Use: No  . Sexually Active: Yes   Other Topics Concern  . None   Social History Narrative  . None    Current outpatient prescriptions:adalimumab (HUMIRA) 40 MG/0.8ML injection, Inject 40 mg into the skin every 14 (fourteen) days., Disp: , Rfl: ;  albuterol (PROVENTIL HFA;VENTOLIN HFA) 108 (90 BASE) MCG/ACT inhaler, Inhale 2 puffs into the lungs every 6 (six) hours as needed. For wheezing., Disp: , Rfl: ;  albuterol (PROVENTIL)  (5 MG/ML) 0.5% nebulizer solution, Take 2.5 mg by nebulization every 6 (six) hours as needed. For wheezing., Disp: , Rfl:  allopurinol (ZYLOPRIM) 100 MG tablet, Take 100 mg by mouth daily., Disp: , Rfl: ;  aspirin EC 81 MG tablet, Take 81 mg by mouth daily., Disp: , Rfl: ;  atenolol (TENORMIN) 50 MG tablet, Take 50 mg by mouth daily., Disp: , Rfl: ;  atorvastatin (LIPITOR) 40 MG tablet, TAKE ONE TABLET BY MOUTH ONE TIME DAILY, Disp: 90 tablet, Rfl: 1 benzonatate (TESSALON) 100 MG capsule, Take 1 capsule (100 mg total) by mouth every 6 (six) hours as needed for cough., Disp: 60 capsule, Rfl: 4;  buPROPion (WELLBUTRIN XL) 300 MG 24 hr tablet, TAKE ONE TABLET BY MOUTH ONE TIME DAILY, Disp: 30 tablet, Rfl: 5;  chlorpheniramine-HYDROcodone (TUSSIONEX) 10-8 MG/5ML LQCR, Take 10 mLs by mouth every 12 (twelve) hours as needed. For cough., Disp: 240 mL, Rfl: 0 cholecalciferol (VITAMIN D) 1000 UNITS tablet, Take 1,000 Units by mouth 2 (two) times daily., Disp: , Rfl: ;  ciclesonide (OMNARIS) 50 MCG/ACT nasal spray, Place 1 spray into both nostrils daily. , Disp: , Rfl: ;  dexlansoprazole (DEXILANT) 60 MG capsule, Take 1 capsule (60 mg total) by mouth daily., Disp: 30 capsule, Rfl: 6;  estradiol (ESTRACE) 2 MG tablet, Take 2  mg by mouth daily., Disp: , Rfl:  fenofibrate (TRICOR) 145 MG tablet, Take 145 mg by mouth daily.  , Disp: , Rfl: ;  furosemide (LASIX) 20 MG tablet, Take 1 tablet (20 mg total) by mouth 2 (two) times daily., Disp: 60 tablet, Rfl: 4;  glyBURIDE (DIABETA) 2.5 MG tablet, TAKE 1 TABLET (2.5 MG TOTAL)  BY MOUTH DAILY WITH BREAKFAST., Disp: 30 tablet, Rfl: 4;  insulin glargine (LANTUS SOLOSTAR) 100 UNIT/ML injection, Inject 20 Units into the skin at bedtime., Disp: 1 pen, Rfl: 3 Insulin Pen Needle 31G X 8 MM MISC, 1 each by Does not apply route daily., Disp: 30 each, Rfl: 3;  LORazepam (ATIVAN) 1 MG tablet, Take 1 tablet (1 mg total) by mouth 2 (two) times daily. For anxiety., Disp: 30 tablet, Rfl: 1;   losartan (COZAAR) 50 MG tablet, Take 50 mg by mouth at bedtime., Disp: , Rfl: ;  metFORMIN (GLUCOPHAGE) 1000 MG tablet, Take 1 tablet (1,000 mg total) by mouth 2 (two) times daily with a meal., Disp: 180 tablet, Rfl: 3 Mometasone Furo-Formoterol Fum (DULERA) 200-5 MCG/ACT AERO, Inhale 2 puffs into the lungs 2 (two) times daily., Disp: 8.8 g, Rfl: 1;  nitroGLYCERIN (NITROSTAT) 0.4 MG SL tablet, Place 0.4 mg under the tongue every 5 (five) minutes as needed. For chest pain., Disp: , Rfl: ;  omeprazole (PRILOSEC) 20 MG capsule, Take 20 mg by mouth at bedtime., Disp: , Rfl:  potassium chloride SA (K-DUR,KLOR-CON) 20 MEQ tablet, Take 20 mEq by mouth 3 (three) times daily., Disp: , Rfl: ;  predniSONE (DELTASONE) 5 MG tablet, Take 12.5 mg by mouth daily., Disp: , Rfl: ;  venlafaxine XR (EFFEXOR-XR) 75 MG 24 hr capsule, Take 150 mg by mouth at bedtime. , Disp: , Rfl:   EXAM:  Filed Vitals:   02/01/13 1348  BP: 138/80  Pulse: 84  Temp: 97.8 F (36.6 C)    Body mass index is 43.92 kg/(m^2).  GENERAL: vitals reviewed and listed above, alert, oriented, appears well hydrated and in no acute distress  HEENT: atraumatic, conjunttiva clear, no obvious abnormalities on inspection of external nose and ears, normal appearance of ear canals and TMs, clear nasal congestion, mild post oropharyngeal erythema with PND, no tonsillar edema or exudate, no sinus TTP  NECK: no obvious masses on inspection  LUNGS: clear to auscultation bilaterally, no wheezes, rales or rhonchi, good air movement  CV: HRRR, no peripheral edema  ABD: soft, BS+, NTTP  MS: moves all extremities without noticeable abnormality  PSYCH: pleasant and cooperative, no obvious depression or anxiety  ASSESSMENT AND PLAN:  Discussed the following assessment and plan:  Viral syndrome  -benign exam, hx and exam c/w with viral illness - supportive care, oral rehydration and return precautions advised -Patient advised to return or notify a  doctor immediately if symptoms worsen or persist or new concerns arise.  Patient Instructions  - plenty of fluids with electrolytes - can do soup broth, bland foods  -no dairy while sick with diarrhea  -can use imodium for the diarrhea  -can use afrin for the nasal congestion for 3-4 days then STOP  -follow up as needed     KIM, HANNAH R.

## 2013-02-02 ENCOUNTER — Encounter (HOSPITAL_COMMUNITY): Payer: Self-pay | Admitting: Adult Health

## 2013-02-02 ENCOUNTER — Emergency Department (HOSPITAL_COMMUNITY)
Admission: EM | Admit: 2013-02-02 | Discharge: 2013-02-03 | Disposition: A | Discharge status: Home / Self Care | Payer: BC Managed Care – PPO | Attending: Emergency Medicine | Admitting: Emergency Medicine

## 2013-02-02 DIAGNOSIS — Z8619 Personal history of other infectious and parasitic diseases: Secondary | ICD-10-CM | POA: Insufficient documentation

## 2013-02-02 DIAGNOSIS — Z7982 Long term (current) use of aspirin: Secondary | ICD-10-CM | POA: Insufficient documentation

## 2013-02-02 DIAGNOSIS — K219 Gastro-esophageal reflux disease without esophagitis: Secondary | ICD-10-CM | POA: Insufficient documentation

## 2013-02-02 DIAGNOSIS — M109 Gout, unspecified: Secondary | ICD-10-CM | POA: Diagnosis not present

## 2013-02-02 DIAGNOSIS — I10 Essential (primary) hypertension: Secondary | ICD-10-CM | POA: Insufficient documentation

## 2013-02-02 DIAGNOSIS — E785 Hyperlipidemia, unspecified: Secondary | ICD-10-CM | POA: Diagnosis not present

## 2013-02-02 DIAGNOSIS — Z8679 Personal history of other diseases of the circulatory system: Secondary | ICD-10-CM | POA: Insufficient documentation

## 2013-02-02 DIAGNOSIS — R079 Chest pain, unspecified: Secondary | ICD-10-CM | POA: Diagnosis not present

## 2013-02-02 DIAGNOSIS — R0602 Shortness of breath: Secondary | ICD-10-CM | POA: Insufficient documentation

## 2013-02-02 DIAGNOSIS — E119 Type 2 diabetes mellitus without complications: Secondary | ICD-10-CM | POA: Insufficient documentation

## 2013-02-02 DIAGNOSIS — E669 Obesity, unspecified: Secondary | ICD-10-CM | POA: Diagnosis not present

## 2013-02-02 DIAGNOSIS — L03119 Cellulitis of unspecified part of limb: Secondary | ICD-10-CM | POA: Diagnosis not present

## 2013-02-02 DIAGNOSIS — Z794 Long term (current) use of insulin: Secondary | ICD-10-CM | POA: Diagnosis not present

## 2013-02-02 DIAGNOSIS — R000 Tachycardia, unspecified: Secondary | ICD-10-CM | POA: Insufficient documentation

## 2013-02-02 DIAGNOSIS — L02419 Cutaneous abscess of limb, unspecified: Secondary | ICD-10-CM | POA: Diagnosis not present

## 2013-02-02 DIAGNOSIS — M7989 Other specified soft tissue disorders: Secondary | ICD-10-CM | POA: Diagnosis not present

## 2013-02-02 DIAGNOSIS — R21 Rash and other nonspecific skin eruption: Secondary | ICD-10-CM | POA: Diagnosis not present

## 2013-02-02 DIAGNOSIS — Z8669 Personal history of other diseases of the nervous system and sense organs: Secondary | ICD-10-CM | POA: Diagnosis not present

## 2013-02-02 DIAGNOSIS — IMO0002 Reserved for concepts with insufficient information to code with codable children: Secondary | ICD-10-CM | POA: Diagnosis not present

## 2013-02-02 DIAGNOSIS — Z79899 Other long term (current) drug therapy: Secondary | ICD-10-CM | POA: Insufficient documentation

## 2013-02-02 DIAGNOSIS — L0390 Cellulitis, unspecified: Secondary | ICD-10-CM

## 2013-02-02 NOTE — ED Notes (Signed)
Presents with central chest pain that radiates to back and shoulder described as crushing and associated with SOB and bilateral leg swelling. Pain began yesterday and is constant.

## 2013-02-03 ENCOUNTER — Emergency Department (HOSPITAL_COMMUNITY): Payer: BC Managed Care – PPO

## 2013-02-03 LAB — CBC
HCT: 33.3 % — ABNORMAL LOW (ref 36.0–46.0)
Hemoglobin: 11.3 g/dL — ABNORMAL LOW (ref 12.0–15.0)
MCH: 30.1 pg (ref 26.0–34.0)
MCHC: 33.9 g/dL (ref 30.0–36.0)
MCV: 88.6 fL (ref 78.0–100.0)
Platelets: 306 10*3/uL (ref 150–400)
RBC: 3.76 MIL/uL — ABNORMAL LOW (ref 3.87–5.11)
RDW: 14.3 % (ref 11.5–15.5)
WBC: 12.8 10*3/uL — ABNORMAL HIGH (ref 4.0–10.5)

## 2013-02-03 LAB — POCT I-STAT TROPONIN I
Troponin i, poc: 0 ng/mL (ref 0.00–0.08)
Troponin i, poc: 0.01 ng/mL (ref 0.00–0.08)

## 2013-02-03 LAB — PRO B NATRIURETIC PEPTIDE: Pro B Natriuretic peptide (BNP): 108.8 pg/mL (ref 0–125)

## 2013-02-03 LAB — BASIC METABOLIC PANEL
BUN: 7 mg/dL (ref 6–23)
CO2: 27 mEq/L (ref 19–32)
Calcium: 8.5 mg/dL (ref 8.4–10.5)
Chloride: 97 mEq/L (ref 96–112)
Creatinine, Ser: 0.49 mg/dL — ABNORMAL LOW (ref 0.50–1.10)
GFR calc Af Amer: >90 mL/min (ref >90)
GFR calc non Af Amer: >90 mL/min (ref >90)
Glucose, Bld: 161 mg/dL — ABNORMAL HIGH (ref 70–99)
Potassium: 3.7 mEq/L (ref 3.5–5.1)
Sodium: 138 mEq/L (ref 135–145)

## 2013-02-03 LAB — D-DIMER, QUANTITATIVE: D-Dimer, Quant: 0.27 ug/mL-FEU (ref 0.00–0.48)

## 2013-02-03 MED ORDER — MORPHINE SULFATE 4 MG/ML IJ SOLN
4.0000 mg | Freq: Once | INTRAMUSCULAR | Status: AC
  Administered 2013-02-03: 4 mg via INTRAVENOUS
  Filled 2013-02-03: qty 1

## 2013-02-03 MED ORDER — HYDROMORPHONE HCL PF 1 MG/ML IJ SOLN
1.0000 mg | Freq: Once | INTRAMUSCULAR | Status: AC
  Administered 2013-02-03: 1 mg via INTRAVENOUS
  Filled 2013-02-03: qty 1

## 2013-02-03 MED ORDER — CLINDAMYCIN HCL 300 MG PO CAPS
300.0000 mg | ORAL_CAPSULE | Freq: Once | ORAL | Status: AC
  Administered 2013-02-03: 300 mg via ORAL
  Filled 2013-02-03: qty 1

## 2013-02-03 MED ORDER — CLINDAMYCIN HCL 150 MG PO CAPS: 150.0000 mg | ORAL_CAPSULE | Freq: Four times a day (QID) | ORAL | Status: DC

## 2013-02-03 MED ORDER — GI COCKTAIL ~~LOC~~
30.0000 mL | Freq: Once | ORAL | Status: AC
  Administered 2013-02-03: 30 mL via ORAL
  Filled 2013-02-03: qty 30

## 2013-02-03 MED ORDER — HYDROCODONE-ACETAMINOPHEN 5-325 MG PO TABS: 2.0000 | ORAL_TABLET | ORAL | Status: DC | PRN

## 2013-02-03 MED ORDER — IOHEXOL 350 MG/ML SOLN
100.0000 mL | Freq: Once | INTRAVENOUS | Status: AC | PRN
  Administered 2013-02-03: 100 mL via INTRAVENOUS

## 2013-02-03 MED ORDER — MORPHINE SULFATE 4 MG/ML IJ SOLN
4.0000 mg | INTRAMUSCULAR | Status: DC | PRN
  Administered 2013-02-03: 4 mg via INTRAVENOUS
  Filled 2013-02-03: qty 1

## 2013-02-03 MED ORDER — ONDANSETRON HCL 4 MG/2ML IJ SOLN
4.0000 mg | Freq: Once | INTRAMUSCULAR | Status: AC
  Administered 2013-02-03: 4 mg via INTRAVENOUS
  Filled 2013-02-03: qty 2

## 2013-02-03 NOTE — ED Notes (Signed)
States still has pain in her back between shoulder blades but states the pain she was feeling over her chest/epigastric area is gone.

## 2013-02-03 NOTE — ED Provider Notes (Signed)
History     CSN: [id number]  Arrival date & time 02/02/13  2336   First MD Initiated Contact with Patient 02/03/13 0004      Chief Complaint  Patient presents with  . Chest Pain    (Consider location/radiation/quality/duration/timing/severity/associated sxs/prior treatment) HPI History provided by patient. Chest pain radiates to upper back both right and left side, onset tonight. Has some associate shortness of breath and bilateral leg swelling. Pain is sharp in quality. She has history of sarcoid but denies any similar symptoms in the past. They moderate in severity and sharp in quality. No nausea vomiting or diaphoresis. Patient is also develop a rash to bilateral lower extremities. She denies any fevers or chills. No trauma. No recent travel. Past Medical History  Diagnosis Date  . Sarcoidosis 04/10/2007  . DIABETES MELLITUS, TYPE II 08/21/2006  . HYPERLIPIDEMIA 08/21/2006  . GOUT 08/20/2010  . OBESITY 06/04/2009  . ANEMIA-UNSPECIFIED 09/18/2009  . HYPERTENSION 08/21/2006  . GERD 08/21/2006  . Shortness of breath   . SLEEP APNEA 04/21/2009    last testing was negative  . Internal hemorrhoids     Past Surgical History  Procedure Laterality Date  . Ventral hernia repair    . Nissen fundoplication    . Cholecystectomy    . Abdominal hysterectomy    . Knee arthroscopy      right  . Tubal ligation    . Bladder suspension  11/11/2011    Procedure: TRANSVAGINAL TAPE (TVT) PROCEDURE;  Surgeon: Levi Aland, MD;  Location: [facility];  Service: Gynecology;  Laterality: N/A;  . Cystoscopy  11/11/2011    Procedure: CYSTOSCOPY;  Surgeon: Levi Aland, MD;  Location: [facility];  Service: Gynecology;  Laterality: N/A;    Family History  Problem Relation Age of Onset  . Diabetes Father   . Heart attack Father   . Coronary artery disease Father   . Heart failure Father   . COPD Mother   . Emphysema Mother   . Asthma Mother   . Heart failure Mother   . Sarcoidosis Maternal Uncle    . Colon cancer Neg Hx   . Lung cancer Brother   . Cancer Brother     History  Substance Use Topics  . Smoking status: Never Smoker   . Smokeless tobacco: Never Used  . Alcohol Use: No    OB History   Grav Para Term Preterm Abortions TAB SAB Ect Mult Living                  Review of Systems  Constitutional: Negative for fever and chills.  HENT: Negative for neck pain and neck stiffness.   Eyes: Negative for pain.  Respiratory: Positive for shortness of breath.   Cardiovascular: Positive for chest pain and leg swelling. Negative for palpitations.  Gastrointestinal: Negative for vomiting and abdominal pain.  Genitourinary: Negative for dysuria.  Musculoskeletal: Negative for back pain.  Skin: Positive for rash.  Neurological: Negative for headaches.  All other systems reviewed and are negative.    Allergies  Lisinopril and Methotrexate  Home Medications   Current Outpatient Rx  Name  Route  Sig  Dispense  Refill  . adalimumab (HUMIRA) 40 MG/0.8ML injection   Subcutaneous   Inject 40 mg into the skin every 14 (fourteen) days.         [name] albuterol (PROVENTIL HFA;VENTOLIN HFA) 108 (90 BASE) MCG/ACT inhaler   Inhalation   Inhale 2 puffs into the lungs every 6 (six)  hours as needed. For wheezing.         [name] albuterol (PROVENTIL) (5 MG/ML) 0.5% nebulizer solution   Nebulization   Take 2.5 mg by nebulization every 6 (six) hours as needed. For wheezing.         [name] allopurinol (ZYLOPRIM) 100 MG tablet   Oral   Take 100 mg by mouth daily.         [name] aspirin EC 81 MG tablet   Oral   Take 81 mg by mouth daily.         [name] atenolol (TENORMIN) 50 MG tablet   Oral   Take 50 mg by mouth daily.         [name] atorvastatin (LIPITOR) 40 MG tablet      TAKE ONE TABLET BY MOUTH ONE TIME DAILY   90 tablet   1   . benzonatate (TESSALON) 100 MG capsule   Oral   Take 1 capsule (100 mg total) by mouth every 6 (six) hours as needed for cough.   60 capsule   4   .  buPROPion (WELLBUTRIN XL) 300 MG 24 hr tablet      TAKE ONE TABLET BY MOUTH ONE TIME DAILY   30 tablet   5   . chlorpheniramine-HYDROcodone (TUSSIONEX) 10-8 MG/5ML LQCR   Oral   Take 10 mLs by mouth every 12 (twelve) hours as needed. For cough.   240 mL   0   . cholecalciferol (VITAMIN D) 1000 UNITS tablet   Oral   Take 1,000 Units by mouth 2 (two) times daily.         . ciclesonide (OMNARIS) 50 MCG/ACT nasal spray   Each Nare   Place 1 spray into both nostrils daily.          [name] dexlansoprazole (DEXILANT) 60 MG capsule   Oral   Take 1 capsule (60 mg total) by mouth daily.   30 capsule   6   . estradiol (ESTRACE) 2 MG tablet   Oral   Take 2 mg by mouth daily.         . fenofibrate (TRICOR) 145 MG tablet   Oral   Take 145 mg by mouth daily.           . furosemide (LASIX) 20 MG tablet   Oral   Take 1 tablet (20 mg total) by mouth 2 (two) times daily.   60 tablet   4   . glyBURIDE (DIABETA) 2.5 MG tablet      TAKE 1 TABLET (2.5 MG TOTAL)  BY MOUTH DAILY WITH BREAKFAST.   30 tablet   4   . insulin glargine (LANTUS SOLOSTAR) 100 UNIT/ML injection   Subcutaneous   Inject 20 Units into the skin at bedtime.   1 pen   3   . Insulin Pen Needle 31G X 8 MM MISC   Does not apply   1 each by Does not apply route daily.   30 each   3   . LORazepam (ATIVAN) 1 MG tablet   Oral   Take 1 tablet (1 mg total) by mouth 2 (two) times daily. For anxiety.   30 tablet   1   . losartan (COZAAR) 50 MG tablet   Oral   Take 50 mg by mouth at bedtime.         . metFORMIN (GLUCOPHAGE) 1000 MG tablet   Oral   Take 1 tablet (1,000 mg total) by mouth 2 (two) times daily  with a meal.   180 tablet   3   . Mometasone Furo-Formoterol Fum (DULERA) 200-5 MCG/ACT AERO   Inhalation   Inhale 2 puffs into the lungs 2 (two) times daily.   8.8 g   1   . nitroGLYCERIN (NITROSTAT) 0.4 MG SL tablet   Sublingual   Place 0.4 mg under the tongue every 5 (five) minutes as needed.  For chest pain.         [name] omeprazole (PRILOSEC) 20 MG capsule   Oral   Take 20 mg by mouth at bedtime.         . potassium chloride SA (K-DUR,KLOR-CON) 20 MEQ tablet   Oral   Take 20 mEq by mouth 3 (three) times daily.         . predniSONE (DELTASONE) 5 MG tablet   Oral   Take 12.5 mg by mouth daily.         [name] venlafaxine XR (EFFEXOR-XR) 75 MG 24 hr capsule   Oral   Take 150 mg by mouth at bedtime.            BP 150/83  Pulse 108  Temp(Src) 98.1 F (36.7 C) (Oral)  Resp 18  SpO2 100%  Physical Exam  Constitutional: She is oriented to person, place, and time. She appears well-developed and well-nourished.  HENT:  Head: Normocephalic and atraumatic.  Mouth/Throat: No oropharyngeal exudate.  Eyes: Conjunctivae and EOM are normal. Pupils are equal, round, and reactive to light.  Neck: Normal range of motion. Neck supple. No tracheal deviation present.  Cardiovascular: Regular rhythm and intact distal pulses.   tachycardic  Pulmonary/Chest: Effort normal and breath sounds normal. No stridor. No respiratory distress. She has no wheezes. She has no rales. She exhibits no tenderness.  Abdominal: Soft. Bowel sounds are normal. She exhibits no distension. There is no tenderness.  Musculoskeletal: Normal range of motion.  erythema to bilateral lower extremities with increased warmth to touch, distal neurovascular intact, mild pretibial edema. No significant calf tenderness, no cords  Neurological: She is alert and oriented to person, place, and time.  Skin: Skin is warm and dry.    ED Course  Procedures (including critical care time)  Results for orders placed during the hospital encounter of 02/02/13  CBC      Result Value Range   WBC 12.8 (*) 4.0 - 10.5 K/uL   RBC 3.76 (*) 3.87 - 5.11 MIL/uL   Hemoglobin 11.3 (*) 12.0 - 15.0 g/dL   HCT 16.1 (*) 09.6 - 04.5 %   MCV 88.6  78.0 - 100.0 fL   MCH 30.1  26.0 - 34.0 pg   MCHC 33.9  30.0 - 36.0 g/dL   RDW 40.9  81.1  - 91.4 %   Platelets 306  150 - 400 K/uL  BASIC METABOLIC PANEL      Result Value Range   Sodium 138  135 - 145 mEq/L   Potassium 3.7  3.5 - 5.1 mEq/L   Chloride 97  96 - 112 mEq/L   CO2 27  19 - 32 mEq/L   Glucose, Bld 161 (*) 70 - 99 mg/dL   BUN 7  6 - 23 mg/dL   Creatinine, Ser 7.82 (*) 0.50 - 1.10 mg/dL   Calcium 8.5  8.4 - 95.6 mg/dL   GFR calc non Af Amer >90  >90 mL/min   GFR calc Af Amer >90  >90 mL/min  PRO B NATRIURETIC PEPTIDE      Result Value  Range   Pro B Natriuretic peptide (BNP) 108.8  0 - 125 pg/mL  D-DIMER, QUANTITATIVE      Result Value Range   D-Dimer, Quant 0.27  0.00 - 0.48 ug/mL-FEU  POCT I-STAT TROPONIN I      Result Value Range   Troponin i, poc 0.01  0.00 - 0.08 ng/mL   Comment 3           POCT I-STAT TROPONIN I      Result Value Range   Troponin i, poc 0.00  0.00 - 0.08 ng/mL   Comment 3            Dg Chest 2 View  02/03/2013  *RADIOLOGY REPORT*  Clinical Data: Chest pain  CHEST - 2 VIEW  Comparison: CT 12/17/2012, chest radiograph 12/16/2012  Findings: Epigastric clips are noted. Cardiomediastinal silhouette is within normal limits. The lungs are clear. No pleural effusion. No pneumothorax.  No acute osseous abnormality.  Lungs are hypoaerated.  IMPRESSION: No acute cardiopulmonary process.   Original Report Authenticated By: Christiana Pellant, M.D.    Ct Angio Chest Pe W/cm &/or Wo Cm  02/03/2013  *RADIOLOGY REPORT*  Clinical Data: Back pain  CT ANGIOGRAPHY CHEST  Technique:  Multidetector CT imaging of the chest using the standard protocol during bolus administration of intravenous contrast. Multiplanar reconstructed images including MIPs were obtained and reviewed to evaluate the vascular anatomy.  Contrast: OMNIPAQUE IOHEXOL 350 MG/ML SOLN  Comparison: 12/17/2012  Findings: Lungs/pleura: There is no pleural effusion identified. No airspace consolidation or atelectasis identified.  There is no suspicious pulmonary parenchymal nodule or mass  identified.  Heart/Mediastinum: Moderate cardiac enlargement.  Calcifications within the LAD coronary artery noted.  No pericardial effusion.  No enlarged mediastinal or hilar lymph nodes.  The main pulmonary artery is patent.  There is no abnormal filling defect within the lumbar or segmental pulmonary arteries to suggest a clinically significant pulmonary embolus.  Upper abdomen: Mild diffuse low attenuation throughout the liver parenchyma is identified consistent with fatty infiltration.  Bones/Musculoskeletal:  Review of the visualized osseous structures is remarkable for spondylosis within the thoracic spine.  No aggressive bone lesions identified.  IMPRESSION:  1.  No evidence for acute pulmonary embolus. 2.  Cardiac enlargement.   Original Report Authenticated By: Signa Kell, M.D.        Date: 02/03/2013  Rate: 107  Rhythm: sinus tachycardia  QRS Axis: normal  Intervals: normal  ST/T Wave abnormalities: nonspecific ST changes  Conduction Disutrbances:none  Narrative Interpretation:   Old EKG Reviewed: none available  IV narcotics pain control. ABx provided for cellulitis bilateral lower extremities  5:22 AM on recheck remains symptomatic, heart rate 110s, old records reviewed and has had CT scans in the past with no history of PE. Given symptoms, CT scan ordered to further evaluate.   7am recheck - LE cellulitis now much more erythematous and warm. Still mildly tachycardic I suspect due to infection.  No emesis or indication for admit at this time. ABx provided, no dissection on CT scan. Pain improved. Plan f/u PCP for recheck. Outpatient referral provided.   MDM  CP/ back pain h/o Sarcoiditis and also LE celluitis tonight.   Labs, Imaging, ECG. IV narcotics and medications provided  Improved symptoms, stable for discharge home. VS and nursing notes reviewed.       Sunnie Nielsen, MD 02/03/13 [phone]

## 2013-02-03 NOTE — ED Notes (Signed)
Patient transported to CT 

## 2013-02-04 ENCOUNTER — Other Ambulatory Visit: Payer: Self-pay | Admitting: Internal Medicine

## 2013-02-04 DIAGNOSIS — H1010 Acute atopic conjunctivitis, unspecified eye: Secondary | ICD-10-CM | POA: Diagnosis not present

## 2013-02-04 DIAGNOSIS — L02419 Cutaneous abscess of limb, unspecified: Secondary | ICD-10-CM | POA: Diagnosis not present

## 2013-02-04 DIAGNOSIS — L03119 Cellulitis of unspecified part of limb: Secondary | ICD-10-CM | POA: Diagnosis not present

## 2013-02-07 ENCOUNTER — Ambulatory Visit (INDEPENDENT_AMBULATORY_CARE_PROVIDER_SITE_OTHER): Payer: BC Managed Care – PPO | Admitting: Family Medicine

## 2013-02-07 ENCOUNTER — Encounter: Payer: Self-pay | Admitting: Family Medicine

## 2013-02-07 VITALS — BP 136/84 | HR 96 | Temp 98.0°F | Wt 279.0 lb

## 2013-02-07 DIAGNOSIS — R609 Edema, unspecified: Secondary | ICD-10-CM | POA: Diagnosis not present

## 2013-02-07 DIAGNOSIS — L0390 Cellulitis, unspecified: Secondary | ICD-10-CM

## 2013-02-07 DIAGNOSIS — L0291 Cutaneous abscess, unspecified: Secondary | ICD-10-CM | POA: Diagnosis not present

## 2013-02-07 DIAGNOSIS — R600 Localized edema: Secondary | ICD-10-CM

## 2013-02-07 MED ORDER — DOXYCYCLINE HYCLATE 100 MG PO TABS: 100.0000 mg | ORAL_TABLET | Freq: Two times a day (BID) | ORAL | Status: DC

## 2013-02-07 NOTE — Progress Notes (Signed)
Chief Complaint  Patient presents with  . post er follow up cellulitis    HPI:  Complicated patient of Dr. Cato Mulligan with PMH sarcoid, DM and below her for follow up for Cellulitis: -seen in ED 4/25 for CP and LE rash -neg work up for CP with trops, BNP, labs, EKG, CXR, CTA -LE cellulitis tx with Clindamycin - thinks may be allergic because 2 days ago started having itching on legs and buttocks with rash -currently reports: legs still a bit swollen and a little red -denies: CP, SOB, fevers, weight loss -diarrhea has improved since last week, some nausea still with emesis yesterday morning -She just recently saw her PCP for follow up chronic medical problems -she has had swelling issues in the past and is on lasix 20mg  bid -she is followed at [facility] by pulm for her sarcoid  ROS: See pertinent positives and negatives per HPI.  Past Medical History  Diagnosis Date  . Sarcoidosis 04/10/2007  . DIABETES MELLITUS, TYPE II 08/21/2006  . HYPERLIPIDEMIA 08/21/2006  . GOUT 08/20/2010  . OBESITY 06/04/2009  . ANEMIA-UNSPECIFIED 09/18/2009  . HYPERTENSION 08/21/2006  . GERD 08/21/2006  . Shortness of breath   . SLEEP APNEA 04/21/2009    last testing was negative  . Internal hemorrhoids     Family History  Problem Relation Age of Onset  . Diabetes Father   . Heart attack Father   . Coronary artery disease Father   . Heart failure Father   . COPD Mother   . Emphysema Mother   . Asthma Mother   . Heart failure Mother   . Sarcoidosis Maternal Uncle   . Colon cancer Neg Hx   . Lung cancer Brother   . Cancer Brother     History   Social History  . Marital Status: Married    Spouse Name: N/A    Number of Children: N/A  . Years of Education: N/A   Occupational History  . DISABLED    Social History Main Topics  . Smoking status: Never Smoker   . Smokeless tobacco: Never Used  . Alcohol Use: No  . Drug Use: No  . Sexually Active: Yes   Other Topics Concern  . None   Social  History Narrative  . None    Current outpatient prescriptions:adalimumab (HUMIRA) 40 MG/0.8ML injection, Inject 40 mg into the skin every 14 (fourteen) days., Disp: , Rfl: ;  albuterol (PROVENTIL HFA;VENTOLIN HFA) 108 (90 BASE) MCG/ACT inhaler, Inhale 2 puffs into the lungs every 6 (six) hours as needed. For wheezing., Disp: , Rfl: ;  albuterol (PROVENTIL) (5 MG/ML) 0.5% nebulizer solution, Take 2.5 mg by nebulization every 6 (six) hours as needed. For wheezing., Disp: , Rfl:  allopurinol (ZYLOPRIM) 100 MG tablet, Take 100 mg by mouth daily., Disp: , Rfl: ;  aspirin EC 81 MG tablet, Take 81 mg by mouth daily., Disp: , Rfl: ;  atenolol (TENORMIN) 50 MG tablet, Take 50 mg by mouth daily., Disp: , Rfl: ;  atorvastatin (LIPITOR) 40 MG tablet, TAKE ONE TABLET BY MOUTH ONE TIME DAILY, Disp: 90 tablet, Rfl: 1 benzonatate (TESSALON) 100 MG capsule, Take 1 capsule (100 mg total) by mouth every 6 (six) hours as needed for cough., Disp: 60 capsule, Rfl: 4;  Bepotastine Besilate (BEPREVE) 1.5 % SOLN, Place 1 drop into both eyes 2 (two) times daily., Disp: , Rfl: ;  buPROPion (WELLBUTRIN XL) 300 MG 24 hr tablet, TAKE ONE TABLET BY MOUTH ONE TIME DAILY, Disp: 30 tablet,  Rfl: 5 chlorpheniramine-HYDROcodone (TUSSIONEX) 10-8 MG/5ML LQCR, Take 10 mLs by mouth every 12 (twelve) hours as needed. For cough., Disp: 240 mL, Rfl: 0;  cholecalciferol (VITAMIN D) 1000 UNITS tablet, Take 1,000 Units by mouth 2 (two) times daily., Disp: , Rfl: ;  ciclesonide (OMNARIS) 50 MCG/ACT nasal spray, Place 1 spray into both nostrils daily. , Disp: , Rfl:  clindamycin (CLEOCIN) 150 MG capsule, Take 1 capsule (150 mg total) by mouth every 6 (six) hours., Disp: 28 capsule, Rfl: 0;  dexlansoprazole (DEXILANT) 60 MG capsule, Take 1 capsule (60 mg total) by mouth daily., Disp: 30 capsule, Rfl: 6;  estradiol (ESTRACE) 2 MG tablet, Take 2 mg by mouth daily., Disp: , Rfl: ;  fenofibrate (TRICOR) 145 MG tablet, Take 145 mg by mouth daily.  , Disp: , Rfl:   furosemide (LASIX) 20 MG tablet, Take 1 tablet (20 mg total) by mouth 2 (two) times daily., Disp: 60 tablet, Rfl: 4;  glyBURIDE (DIABETA) 2.5 MG tablet, TAKE 1 TABLET (2.5 MG TOTAL)  BY MOUTH DAILY WITH BREAKFAST., Disp: 30 tablet, Rfl: 4;  HYDROcodone-acetaminophen (NORCO/VICODIN) 5-325 MG per tablet, Take 2 tablets by mouth every 4 (four) hours as needed for pain., Disp: 10 tablet, Rfl: 0 insulin glargine (LANTUS SOLOSTAR) 100 UNIT/ML injection, Inject 20 Units into the skin at bedtime., Disp: 1 pen, Rfl: 3;  Insulin Pen Needle 31G X 8 MM MISC, 1 each by Does not apply route daily., Disp: 30 each, Rfl: 3;  LORazepam (ATIVAN) 1 MG tablet, Take 1 tablet (1 mg total) by mouth 2 (two) times daily. For anxiety., Disp: 30 tablet, Rfl: 1;  losartan (COZAAR) 50 MG tablet, Take 50 mg by mouth at bedtime., Disp: , Rfl:  metFORMIN (GLUCOPHAGE) 1000 MG tablet, Take 1 tablet (1,000 mg total) by mouth 2 (two) times daily with a meal., Disp: 180 tablet, Rfl: 3;  Mometasone Furo-Formoterol Fum (DULERA) 200-5 MCG/ACT AERO, Inhale 2 puffs into the lungs 2 (two) times daily., Disp: 8.8 g, Rfl: 1;  naproxen (NAPROSYN) 500 MG tablet, Take 500 mg by mouth. Take 1 tablet twice daily for one week. Then as needed for muscle joint pain., Disp: , Rfl:  nitroGLYCERIN (NITROSTAT) 0.4 MG SL tablet, Place 0.4 mg under the tongue every 5 (five) minutes as needed. For chest pain., Disp: , Rfl: ;  omeprazole (PRILOSEC) 20 MG capsule, Take 20 mg by mouth at bedtime., Disp: , Rfl: ;  potassium chloride SA (K-DUR,KLOR-CON) 20 MEQ tablet, TAKE ONE TABLET BY MOUTH THREE TIMES DAILY, Disp: 90 tablet, Rfl: 5;  predniSONE (DELTASONE) 5 MG tablet, Take 12.5 mg by mouth daily., Disp: , Rfl:  venlafaxine XR (EFFEXOR-XR) 75 MG 24 hr capsule, TAKE ONE CAPSULE BY MOUTH TWICE DAILY, Disp: 60 capsule, Rfl: 5;  doxycycline (VIBRA-TABS) 100 MG tablet, Take 1 tablet (100 mg total) by mouth 2 (two) times daily., Disp: 10 tablet, Rfl: 0  EXAM:  Filed Vitals:    02/07/13 0948  BP: 136/84  Pulse:   Temp:     Body mass index is 45.05 kg/(m^2).  GENERAL: vitals reviewed and listed above, alert, oriented, appears well hydrated and in no acute distress  HEENT: atraumatic, conjunttiva clear, no obvious abnormalities on inspection of external nose and ears  NECK: no obvious masses on inspection  LUNGS: clear to auscultation bilaterally, no wheezes, rales or rhonchi, good air movement  CV: HRRR, 1+ blat pitting edema in LE to mid calf  ABD: soft, BS +, NTTP  SKIN: mildly erythematous skin bilat  LE in ankle and lower calf region  MS: moves all extremities without noticeable abnormality  PSYCH: pleasant and cooperative, no obvious depression or anxiety  ASSESSMENT AND PLAN:  Discussed the following assessment and plan:  Cellulitis - Plan: DISCONTINUED: doxycycline (VIBRA-TABS) 100 MG tablet  Lower extremity edema  -legs really do not look infected today - she has chronic LE edema on lasix per her report and this is more likely venous stasis dermatitis. Will DC clinda as I feel she may be having a mild reaction to this causing nausea, rash. Will do 5 days of doxy in case this ws cellulitis. Advised increased to 40mg  lasix in am for a few days from her normal 20mg  dose and elevating legs bid. Also advised compression once redness resolved - Rx provided.  -follow up next available with PCP and prn. -Patient advised to return or notify a doctor immediately if symptoms worsen or persist or new concerns arise.  Patient Instructions  -stop the clindamycin  -start the doxycycline -As we discussed, we have prescribed a new medication for you at this appointment. We discussed the common and serious potential adverse effects of this medication and you can review these and more with the pharmacist when you pick up your medication.  Please follow the instructions for use carefully and notify us immediately if you have any problems taking this  medication.  -increase lasix to 40mg  in the morning for the next 3 days  -elevate few above waist with warm compresses for 30 minutes twice dialy  -if itchy rash persists in 1 week or worsens see you dermatologist  -get compression stockings  -follow up with your doctor at next available appointment or sooner if needed or if symptoms worsen or persist     KIM, HANNAH R.

## 2013-02-07 NOTE — Patient Instructions (Addendum)
-  stop the clindamycin  -start the doxycycline -As we discussed, we have prescribed a new medication for you at this appointment. We discussed the common and serious potential adverse effects of this medication and you can review these and more with the pharmacist when you pick up your medication.  Please follow the instructions for use carefully and notify us immediately if you have any problems taking this medication.  -increase lasix to 40mg  in the morning for the next 3 days  -elevate few above waist with warm compresses for 30 minutes twice dialy  -if itchy rash persists in 1 week or worsens see you dermatologist  -get compression stockings  -follow up with your doctor at next available appointment or sooner if needed or if symptoms worsen or persist

## 2013-02-08 ENCOUNTER — Ambulatory Visit: Payer: BC Managed Care – PPO | Admitting: Family Medicine

## 2013-02-10 DIAGNOSIS — R0609 Other forms of dyspnea: Secondary | ICD-10-CM | POA: Insufficient documentation

## 2013-02-10 DIAGNOSIS — K219 Gastro-esophageal reflux disease without esophagitis: Secondary | ICD-10-CM | POA: Diagnosis not present

## 2013-02-10 DIAGNOSIS — E785 Hyperlipidemia, unspecified: Secondary | ICD-10-CM | POA: Insufficient documentation

## 2013-02-10 DIAGNOSIS — R0602 Shortness of breath: Secondary | ICD-10-CM | POA: Insufficient documentation

## 2013-02-10 DIAGNOSIS — E669 Obesity, unspecified: Secondary | ICD-10-CM | POA: Insufficient documentation

## 2013-02-10 DIAGNOSIS — D869 Sarcoidosis, unspecified: Secondary | ICD-10-CM | POA: Diagnosis not present

## 2013-02-10 DIAGNOSIS — I10 Essential (primary) hypertension: Secondary | ICD-10-CM | POA: Insufficient documentation

## 2013-02-10 DIAGNOSIS — E119 Type 2 diabetes mellitus without complications: Secondary | ICD-10-CM | POA: Insufficient documentation

## 2013-02-10 DIAGNOSIS — Z79899 Other long term (current) drug therapy: Secondary | ICD-10-CM | POA: Insufficient documentation

## 2013-02-10 DIAGNOSIS — IMO0002 Reserved for concepts with insufficient information to code with codable children: Secondary | ICD-10-CM | POA: Diagnosis not present

## 2013-02-10 DIAGNOSIS — R0989 Other specified symptoms and signs involving the circulatory and respiratory systems: Secondary | ICD-10-CM | POA: Diagnosis not present

## 2013-02-10 DIAGNOSIS — M7989 Other specified soft tissue disorders: Secondary | ICD-10-CM | POA: Insufficient documentation

## 2013-02-10 DIAGNOSIS — I509 Heart failure, unspecified: Secondary | ICD-10-CM | POA: Diagnosis not present

## 2013-02-10 DIAGNOSIS — Z6841 Body Mass Index (BMI) 40.0 and over, adult: Secondary | ICD-10-CM | POA: Diagnosis not present

## 2013-02-10 DIAGNOSIS — I5033 Acute on chronic diastolic (congestive) heart failure: Secondary | ICD-10-CM | POA: Insufficient documentation

## 2013-02-10 DIAGNOSIS — M109 Gout, unspecified: Secondary | ICD-10-CM | POA: Diagnosis not present

## 2013-02-10 DIAGNOSIS — Z23 Encounter for immunization: Secondary | ICD-10-CM | POA: Diagnosis not present

## 2013-02-10 NOTE — ED Notes (Signed)
Bilateral lower extremity swelling and redness with behind the knees, with SOB during exertion. Was seen last Friday and diagnosed cellulitis, which had started to improve but worsening Thursday. SOB started Friday.

## 2013-02-11 ENCOUNTER — Observation Stay (HOSPITAL_COMMUNITY)
Admission: EM | Admit: 2013-02-11 | Discharge: 2013-02-13 | Disposition: A | Discharge status: Home Health | Payer: BC Managed Care – PPO | Attending: Family Medicine | Admitting: Family Medicine

## 2013-02-11 ENCOUNTER — Encounter (HOSPITAL_COMMUNITY): Payer: Self-pay | Admitting: Emergency Medicine

## 2013-02-11 ENCOUNTER — Emergency Department (HOSPITAL_COMMUNITY): Payer: BC Managed Care – PPO

## 2013-02-11 DIAGNOSIS — R0609 Other forms of dyspnea: Secondary | ICD-10-CM

## 2013-02-11 DIAGNOSIS — G4730 Sleep apnea, unspecified: Secondary | ICD-10-CM

## 2013-02-11 DIAGNOSIS — I5033 Acute on chronic diastolic (congestive) heart failure: Secondary | ICD-10-CM

## 2013-02-11 DIAGNOSIS — I10 Essential (primary) hypertension: Secondary | ICD-10-CM

## 2013-02-11 DIAGNOSIS — E1159 Type 2 diabetes mellitus with other circulatory complications: Secondary | ICD-10-CM | POA: Diagnosis present

## 2013-02-11 DIAGNOSIS — M109 Gout, unspecified: Secondary | ICD-10-CM

## 2013-02-11 DIAGNOSIS — E1169 Type 2 diabetes mellitus with other specified complication: Secondary | ICD-10-CM | POA: Diagnosis present

## 2013-02-11 DIAGNOSIS — E669 Obesity, unspecified: Secondary | ICD-10-CM

## 2013-02-11 DIAGNOSIS — K219 Gastro-esophageal reflux disease without esophagitis: Secondary | ICD-10-CM

## 2013-02-11 DIAGNOSIS — I509 Heart failure, unspecified: Secondary | ICD-10-CM

## 2013-02-11 DIAGNOSIS — R0600 Dyspnea, unspecified: Secondary | ICD-10-CM

## 2013-02-11 DIAGNOSIS — R053 Chronic cough: Secondary | ICD-10-CM

## 2013-02-11 DIAGNOSIS — E785 Hyperlipidemia, unspecified: Secondary | ICD-10-CM

## 2013-02-11 DIAGNOSIS — R05 Cough: Secondary | ICD-10-CM

## 2013-02-11 DIAGNOSIS — I152 Hypertension secondary to endocrine disorders: Secondary | ICD-10-CM | POA: Diagnosis present

## 2013-02-11 DIAGNOSIS — E1129 Type 2 diabetes mellitus with other diabetic kidney complication: Secondary | ICD-10-CM | POA: Diagnosis present

## 2013-02-11 DIAGNOSIS — E119 Type 2 diabetes mellitus without complications: Secondary | ICD-10-CM

## 2013-02-11 DIAGNOSIS — J99 Respiratory disorders in diseases classified elsewhere: Secondary | ICD-10-CM

## 2013-02-11 DIAGNOSIS — D869 Sarcoidosis, unspecified: Secondary | ICD-10-CM

## 2013-02-11 DIAGNOSIS — Z23 Encounter for immunization: Secondary | ICD-10-CM | POA: Diagnosis not present

## 2013-02-11 DIAGNOSIS — R0989 Other specified symptoms and signs involving the circulatory and respiratory systems: Secondary | ICD-10-CM | POA: Diagnosis not present

## 2013-02-11 DIAGNOSIS — R600 Localized edema: Secondary | ICD-10-CM

## 2013-02-11 DIAGNOSIS — D860 Sarcoidosis of lung: Secondary | ICD-10-CM

## 2013-02-11 LAB — TROPONIN I
Troponin I: <0.3 ng/mL (ref <0.30)
Troponin I: <0.3 ng/mL (ref <0.30)
Troponin I: <0.3 ng/mL (ref <0.30)

## 2013-02-11 LAB — CREATININE, SERUM
Creatinine, Ser: 0.59 mg/dL (ref 0.50–1.10)
GFR calc Af Amer: >90 mL/min (ref >90)
GFR calc non Af Amer: >90 mL/min (ref >90)

## 2013-02-11 LAB — CBC
HCT: 32.5 % — ABNORMAL LOW (ref 36.0–46.0)
HCT: 34.9 % — ABNORMAL LOW (ref 36.0–46.0)
Hemoglobin: 10.9 g/dL — ABNORMAL LOW (ref 12.0–15.0)
Hemoglobin: 11.7 g/dL — ABNORMAL LOW (ref 12.0–15.0)
MCH: 30.3 pg (ref 26.0–34.0)
MCH: 29.8 pg (ref 26.0–34.0)
MCHC: 33.5 g/dL (ref 30.0–36.0)
MCHC: 33.5 g/dL (ref 30.0–36.0)
MCV: 90.3 fL (ref 78.0–100.0)
MCV: 88.8 fL (ref 78.0–100.0)
Platelets: 354 10*3/uL (ref 150–400)
Platelets: 366 10*3/uL (ref 150–400)
RBC: 3.6 MIL/uL — ABNORMAL LOW (ref 3.87–5.11)
RBC: 3.93 MIL/uL (ref 3.87–5.11)
RDW: 14.9 % (ref 11.5–15.5)
RDW: 15 % (ref 11.5–15.5)
WBC: 15.1 10*3/uL — ABNORMAL HIGH (ref 4.0–10.5)
WBC: 13.7 10*3/uL — ABNORMAL HIGH (ref 4.0–10.5)

## 2013-02-11 LAB — GLUCOSE, CAPILLARY
Glucose-Capillary: 141 mg/dL — ABNORMAL HIGH (ref 70–99)
Glucose-Capillary: 171 mg/dL — ABNORMAL HIGH (ref 70–99)
Glucose-Capillary: 175 mg/dL — ABNORMAL HIGH (ref 70–99)
Glucose-Capillary: 239 mg/dL — ABNORMAL HIGH (ref 70–99)

## 2013-02-11 LAB — COMPREHENSIVE METABOLIC PANEL
ALT: 37 U/L — ABNORMAL HIGH (ref 0–35)
AST: 31 U/L (ref 0–37)
Albumin: 3.8 g/dL (ref 3.5–5.2)
Alkaline Phosphatase: 144 U/L — ABNORMAL HIGH (ref 39–117)
BUN: 15 mg/dL (ref 6–23)
CO2: 28 mEq/L (ref 19–32)
Calcium: 8.9 mg/dL (ref 8.4–10.5)
Chloride: 97 mEq/L (ref 96–112)
Creatinine, Ser: 0.56 mg/dL (ref 0.50–1.10)
GFR calc Af Amer: >90 mL/min (ref >90)
GFR calc non Af Amer: >90 mL/min (ref >90)
Glucose, Bld: 220 mg/dL — ABNORMAL HIGH (ref 70–99)
Potassium: 4 mEq/L (ref 3.5–5.1)
Sodium: 140 mEq/L (ref 135–145)
Total Bilirubin: 0.5 mg/dL (ref 0.3–1.2)
Total Protein: 7.2 g/dL (ref 6.0–8.3)

## 2013-02-11 LAB — URINALYSIS, ROUTINE W REFLEX MICROSCOPIC
Bilirubin Urine: NEGATIVE
Glucose, UA: NEGATIVE mg/dL
Hgb urine dipstick: NEGATIVE
Ketones, ur: NEGATIVE mg/dL
Leukocytes, UA: NEGATIVE
Nitrite: NEGATIVE
Protein, ur: NEGATIVE mg/dL
Specific Gravity, Urine: 1.025 (ref 1.005–1.030)
Urobilinogen, UA: 0.2 mg/dL (ref 0.0–1.0)
pH: 5 (ref 5.0–8.0)

## 2013-02-11 LAB — MAGNESIUM: Magnesium: 1.2 mg/dL — ABNORMAL LOW (ref 1.5–2.5)

## 2013-02-11 LAB — TSH: TSH: 4.705 u[IU]/mL — ABNORMAL HIGH (ref 0.350–4.500)

## 2013-02-11 LAB — PRO B NATRIURETIC PEPTIDE: Pro B Natriuretic peptide (BNP): 437.1 pg/mL — ABNORMAL HIGH (ref 0–125)

## 2013-02-11 LAB — POCT I-STAT TROPONIN I: Troponin i, poc: 0.01 ng/mL (ref 0.00–0.08)

## 2013-02-11 LAB — D-DIMER, QUANTITATIVE: D-Dimer, Quant: 0.64 ug/mL-FEU — ABNORMAL HIGH (ref 0.00–0.48)

## 2013-02-11 MED ORDER — NITROGLYCERIN 0.4 MG SL SUBL
0.4000 mg | SUBLINGUAL_TABLET | SUBLINGUAL | Status: DC | PRN
Start: 2013-02-11 — End: 2013-02-13
  Filled 2013-02-11: qty 25

## 2013-02-11 MED ORDER — ATORVASTATIN CALCIUM 40 MG PO TABS
40.0000 mg | ORAL_TABLET | Freq: Every day | ORAL | Status: DC
  Administered 2013-02-11 – 2013-02-12 (×2): 40 mg via ORAL
  Filled 2013-02-11 (×3): qty 1

## 2013-02-11 MED ORDER — VENLAFAXINE HCL ER 75 MG PO CP24
75.0000 mg | ORAL_CAPSULE | Freq: Two times a day (BID) | ORAL | Status: DC
  Administered 2013-02-11 – 2013-02-13 (×5): 75 mg via ORAL
  Filled 2013-02-11 (×6): qty 1

## 2013-02-11 MED ORDER — BUPROPION HCL ER (XL) 300 MG PO TB24
300.0000 mg | ORAL_TABLET | Freq: Every day | ORAL | Status: DC
  Administered 2013-02-11 – 2013-02-13 (×3): 300 mg via ORAL
  Filled 2013-02-11 (×3): qty 1

## 2013-02-11 MED ORDER — DOXYCYCLINE HYCLATE 100 MG PO TABS
100.0000 mg | ORAL_TABLET | Freq: Two times a day (BID) | ORAL | Status: DC
  Administered 2013-02-11 – 2013-02-12 (×3): 100 mg via ORAL
  Filled 2013-02-11 (×4): qty 1

## 2013-02-11 MED ORDER — INSULIN ASPART 100 UNIT/ML ~~LOC~~ SOLN
0.0000 [IU] | Freq: Three times a day (TID) | SUBCUTANEOUS | Status: DC
  Administered 2013-02-11: 17:00:00 via SUBCUTANEOUS
  Administered 2013-02-11: 3 [IU] via SUBCUTANEOUS
  Administered 2013-02-12: 5 [IU] via SUBCUTANEOUS
  Administered 2013-02-12: 8 [IU] via SUBCUTANEOUS
  Administered 2013-02-12: 2 [IU] via SUBCUTANEOUS
  Administered 2013-02-13: 5 [IU] via SUBCUTANEOUS
  Administered 2013-02-13: 2 [IU] via SUBCUTANEOUS

## 2013-02-11 MED ORDER — VITAMIN D3 25 MCG (1000 UNIT) PO TABS
1000.0000 [IU] | ORAL_TABLET | Freq: Two times a day (BID) | ORAL | Status: DC
  Administered 2013-02-11 – 2013-02-13 (×5): 1000 [IU] via ORAL
  Filled 2013-02-11 (×6): qty 1

## 2013-02-11 MED ORDER — BENZONATATE 100 MG PO CAPS
100.0000 mg | ORAL_CAPSULE | Freq: Four times a day (QID) | ORAL | Status: DC | PRN
  Filled 2013-02-11: qty 1

## 2013-02-11 MED ORDER — ASPIRIN EC 81 MG PO TBEC
81.0000 mg | DELAYED_RELEASE_TABLET | Freq: Every day | ORAL | Status: DC
  Administered 2013-02-11 – 2013-02-13 (×3): 81 mg via ORAL
  Filled 2013-02-11 (×3): qty 1

## 2013-02-11 MED ORDER — OXYCODONE HCL 5 MG PO TABS
5.0000 mg | ORAL_TABLET | Freq: Once | ORAL | Status: AC
  Administered 2013-02-11: 5 mg via ORAL
  Filled 2013-02-11: qty 1

## 2013-02-11 MED ORDER — OLOPATADINE HCL 0.1 % OP SOLN
1.0000 [drp] | Freq: Two times a day (BID) | OPHTHALMIC | Status: DC
  Administered 2013-02-11 – 2013-02-13 (×5): 1 [drp] via OPHTHALMIC
  Filled 2013-02-11: qty 5

## 2013-02-11 MED ORDER — GLYBURIDE 2.5 MG PO TABS
2.5000 mg | ORAL_TABLET | Freq: Every day | ORAL | Status: DC
  Administered 2013-02-12 – 2013-02-13 (×2): 2.5 mg via ORAL
  Filled 2013-02-11 (×3): qty 1

## 2013-02-11 MED ORDER — IOHEXOL 350 MG/ML SOLN
100.0000 mL | Freq: Once | INTRAVENOUS | Status: AC | PRN
  Administered 2013-02-11: 100 mL via INTRAVENOUS

## 2013-02-11 MED ORDER — HEPARIN SODIUM (PORCINE) 5000 UNIT/ML IJ SOLN
5000.0000 [IU] | Freq: Three times a day (TID) | INTRAMUSCULAR | Status: DC
  Administered 2013-02-11 – 2013-02-13 (×7): 5000 [IU] via SUBCUTANEOUS
  Filled 2013-02-11 (×10): qty 1

## 2013-02-11 MED ORDER — SODIUM CHLORIDE 0.9 % IJ SOLN: 3.0000 mL | INTRAMUSCULAR | Status: DC | PRN

## 2013-02-11 MED ORDER — ONDANSETRON HCL 4 MG/2ML IJ SOLN: 4.0000 mg | Freq: Three times a day (TID) | INTRAMUSCULAR | Status: DC | PRN

## 2013-02-11 MED ORDER — PANTOPRAZOLE SODIUM 40 MG PO TBEC
40.0000 mg | DELAYED_RELEASE_TABLET | Freq: Every day | ORAL | Status: DC
  Administered 2013-02-11 – 2013-02-13 (×3): 40 mg via ORAL
  Filled 2013-02-11 (×2): qty 1

## 2013-02-11 MED ORDER — CICLESONIDE 50 MCG/ACT NA SUSP: 1.0000 | Freq: Every day | NASAL | Status: DC

## 2013-02-11 MED ORDER — LORAZEPAM 0.5 MG PO TABS: 1.0000 mg | ORAL_TABLET | Freq: Two times a day (BID) | ORAL | Status: DC | PRN

## 2013-02-11 MED ORDER — ONDANSETRON HCL 4 MG/2ML IJ SOLN: 4.0000 mg | Freq: Four times a day (QID) | INTRAMUSCULAR | Status: DC | PRN

## 2013-02-11 MED ORDER — MOMETASONE FURO-FORMOTEROL FUM 200-5 MCG/ACT IN AERO
2.0000 | INHALATION_SPRAY | Freq: Two times a day (BID) | RESPIRATORY_TRACT | Status: DC
  Administered 2013-02-11 – 2013-02-13 (×5): 2 via RESPIRATORY_TRACT
  Filled 2013-02-11: qty 8.8

## 2013-02-11 MED ORDER — ALLOPURINOL 100 MG PO TABS
100.0000 mg | ORAL_TABLET | Freq: Every day | ORAL | Status: DC
  Administered 2013-02-11 – 2013-02-13 (×3): 100 mg via ORAL
  Filled 2013-02-11 (×3): qty 1

## 2013-02-11 MED ORDER — PREDNISONE 2.5 MG PO TABS
12.5000 mg | ORAL_TABLET | Freq: Every day | ORAL | Status: DC
  Administered 2013-02-11 – 2013-02-13 (×3): 12.5 mg via ORAL
  Filled 2013-02-11 (×5): qty 1

## 2013-02-11 MED ORDER — ACETAMINOPHEN 325 MG PO TABS
650.0000 mg | ORAL_TABLET | ORAL | Status: DC | PRN
  Administered 2013-02-11: 650 mg via ORAL
  Filled 2013-02-11: qty 2

## 2013-02-11 MED ORDER — HYDROCODONE-ACETAMINOPHEN 5-325 MG PO TABS
2.0000 | ORAL_TABLET | ORAL | Status: DC | PRN
  Administered 2013-02-11 – 2013-02-12 (×2): 2 via ORAL
  Filled 2013-02-11 (×2): qty 2

## 2013-02-11 MED ORDER — ALBUTEROL SULFATE (5 MG/ML) 0.5% IN NEBU: 2.5000 mg | INHALATION_SOLUTION | RESPIRATORY_TRACT | Status: AC | PRN

## 2013-02-11 MED ORDER — INSULIN GLARGINE 100 UNIT/ML ~~LOC~~ SOLN
20.0000 [IU] | Freq: Every day | SUBCUTANEOUS | Status: DC
  Administered 2013-02-11 – 2013-02-12 (×2): 20 [IU] via SUBCUTANEOUS
  Filled 2013-02-11 (×3): qty 0.2

## 2013-02-11 MED ORDER — FUROSEMIDE 10 MG/ML IJ SOLN
60.0000 mg | Freq: Once | INTRAMUSCULAR | Status: AC
  Administered 2013-02-11: 60 mg via INTRAVENOUS
  Filled 2013-02-11: qty 6

## 2013-02-11 MED ORDER — ALBUTEROL SULFATE HFA 108 (90 BASE) MCG/ACT IN AERS
2.0000 | INHALATION_SPRAY | Freq: Four times a day (QID) | RESPIRATORY_TRACT | Status: DC | PRN
  Filled 2013-02-11: qty 6.7

## 2013-02-11 MED ORDER — SODIUM CHLORIDE 0.9 % IV SOLN: 250.0000 mL | INTRAVENOUS | Status: DC | PRN

## 2013-02-11 MED ORDER — FLUTICASONE PROPIONATE 50 MCG/ACT NA SUSP
2.0000 | Freq: Every day | NASAL | Status: DC
  Administered 2013-02-11 – 2013-02-13 (×2): 2 via NASAL
  Filled 2013-02-11: qty 16

## 2013-02-11 MED ORDER — BEPOTASTINE BESILATE 1.5 % OP SOLN: 1.0000 [drp] | Freq: Two times a day (BID) | OPHTHALMIC | Status: DC

## 2013-02-11 MED ORDER — HYDROCODONE-ACETAMINOPHEN 5-325 MG PO TABS
2.0000 | ORAL_TABLET | Freq: Once | ORAL | Status: AC
  Administered 2013-02-11: 2 via ORAL
  Filled 2013-02-11: qty 2

## 2013-02-11 MED ORDER — FUROSEMIDE 10 MG/ML IJ SOLN
40.0000 mg | Freq: Two times a day (BID) | INTRAMUSCULAR | Status: DC
  Administered 2013-02-11 – 2013-02-13 (×5): 40 mg via INTRAVENOUS
  Filled 2013-02-11 (×5): qty 4

## 2013-02-11 MED ORDER — LOSARTAN POTASSIUM 50 MG PO TABS
50.0000 mg | ORAL_TABLET | Freq: Every day | ORAL | Status: DC
  Administered 2013-02-11 – 2013-02-12 (×2): 50 mg via ORAL
  Filled 2013-02-11 (×3): qty 1

## 2013-02-11 MED ORDER — SODIUM CHLORIDE 0.9 % IJ SOLN
3.0000 mL | Freq: Two times a day (BID) | INTRAMUSCULAR | Status: DC
  Administered 2013-02-11 – 2013-02-12 (×4): 3 mL via INTRAVENOUS

## 2013-02-11 MED ORDER — ESTRADIOL 2 MG PO TABS
2.0000 mg | ORAL_TABLET | Freq: Every day | ORAL | Status: DC
  Administered 2013-02-11 – 2013-02-13 (×3): 2 mg via ORAL
  Filled 2013-02-11 (×3): qty 1

## 2013-02-11 NOTE — H&P (Signed)
[facility] History and Physical  [name] EAV:[id number] DOB: 12-08-1960 DOA: 02/11/2013  Referring physician: Dr. Rulon Abide PCP: [name], MD  Specialists: none currently involved in case  Chief Complaint: SOB, DOE  HPI: [name] is a 52 y.o. female  With h/o Diastolic CHF grade I, HTN, DM, HPL, who presented to the ED complaining of worsening SOB since 3 days ago.  She reports 3 pillow orthopnea.  The problem has been persistent and gradually getting worse. Nothing she does helps her condition get better. Walking or exertion makes her SOB worse. Denies any fevers, chills, or cough.    While in the ED was found to have an elevated BNP of 437, negative troponin, elevated D dimer with subsequent CT angiogram which was negative for PE.  We were consulted for further evaluation and recommendations for suspected acute CHF exacerbation  Review of Systems: 10 point review of system reviewed and negative unless otherwise mentioned above.  Past Medical History  Diagnosis Date  . Sarcoidosis 04/10/2007  . DIABETES MELLITUS, TYPE II 08/21/2006  . HYPERLIPIDEMIA 08/21/2006  . GOUT 08/20/2010  . OBESITY 06/04/2009  . ANEMIA-UNSPECIFIED 09/18/2009  . HYPERTENSION 08/21/2006  . GERD 08/21/2006  . Shortness of breath   . SLEEP APNEA 04/21/2009    last testing was negative  . Internal hemorrhoids    Past Surgical History  Procedure Laterality Date  . Ventral hernia repair    . Nissen fundoplication    . Cholecystectomy    . Abdominal hysterectomy    . Knee arthroscopy      right  . Tubal ligation    . Bladder suspension  11/11/2011    Procedure: TRANSVAGINAL TAPE (TVT) PROCEDURE;  Surgeon: Levi Aland, MD;  Location: [facility];  Service: Gynecology;  Laterality: N/A;  . Cystoscopy  11/11/2011    Procedure: CYSTOSCOPY;  Surgeon: Levi Aland, MD;  Location: [facility];  Service: Gynecology;  Laterality: N/A;   Social History:  reports that she has never smoked. She  has never used smokeless tobacco. She reports that she does not drink alcohol or use illicit drugs.  where does patient live--home, ALF, SNF? and with whom if at home? Lives at home with husband  Can patient participate in ADLs? yes  Allergies  Allergen Reactions  . Clindamycin/Lincomycin     Nausea, rash  . Lisinopril Cough  . Methotrexate     REACTION: peri-oral and buccal lesions.    Family History  Problem Relation Age of Onset  . Diabetes Father   . Heart attack Father   . Coronary artery disease Father   . Heart failure Father   . COPD Mother   . Emphysema Mother   . Asthma Mother   . Heart failure Mother   . Sarcoidosis Maternal Uncle   . Colon cancer Neg Hx   . Lung cancer Brother   . Cancer Brother   None other reported  Prior to Admission medications   Medication Sig Start Date End Date Taking? Authorizing Provider  adalimumab (HUMIRA) 40 MG/0.8ML injection Inject 40 mg into the skin every 14 (fourteen) days.   Yes Historical Provider, MD  albuterol (PROVENTIL HFA;VENTOLIN HFA) 108 (90 BASE) MCG/ACT inhaler Inhale 2 puffs into the lungs every 6 (six) hours as needed. For wheezing.   Yes Historical Provider, MD  albuterol (PROVENTIL) (5 MG/ML) 0.5% nebulizer solution Take 2.5 mg by nebulization every 6 (six) hours as needed. For wheezing.   Yes Historical Provider, MD  allopurinol (ZYLOPRIM) 100 MG tablet Take 100 mg by mouth daily.   Yes Historical Provider, MD  aspirin EC 81 MG tablet Take 81 mg by mouth daily.   Yes Historical Provider, MD  atenolol (TENORMIN) 50 MG tablet Take 50 mg by mouth daily.   Yes Historical Provider, MD  atorvastatin (LIPITOR) 40 MG tablet Take 40 mg by mouth at bedtime.   Yes Historical Provider, MD  benzonatate (TESSALON) 100 MG capsule Take 1 capsule (100 mg total) by mouth every 6 (six) hours as needed for cough. 07/19/12  Yes Storm Frisk, MD  Bepotastine Besilate (BEPREVE) 1.5 % SOLN Place 1 drop into both eyes 2 (two) times daily.    Yes Historical Provider, MD  buPROPion (WELLBUTRIN XL) 300 MG 24 hr tablet Take 300 mg by mouth daily.   Yes Historical Provider, MD  chlorpheniramine-HYDROcodone (TUSSIONEX) 10-8 MG/5ML LQCR Take 10 mLs by mouth every 12 (twelve) hours as needed. For cough. 11/03/12  Yes Storm Frisk, MD  cholecalciferol (VITAMIN D) 1000 UNITS tablet Take 1,000 Units by mouth 2 (two) times daily.   Yes Historical Provider, MD  ciclesonide (OMNARIS) 50 MCG/ACT nasal spray Place 1 spray into both nostrils daily.    Yes Historical Provider, MD  dexlansoprazole (DEXILANT) 60 MG capsule Take 1 capsule (60 mg total) by mouth daily. 12/28/12  Yes Storm Frisk, MD  doxycycline (VIBRA-TABS) 100 MG tablet Take 1 tablet (100 mg total) by mouth 2 (two) times daily. 02/07/13  Yes Terressa Koyanagi, DO  estradiol (ESTRACE) 2 MG tablet Take 2 mg by mouth daily.   Yes Historical Provider, MD  fenofibrate (TRICOR) 145 MG tablet Take 145 mg by mouth daily.     Yes Historical Provider, MD  furosemide (LASIX) 20 MG tablet Take 1 tablet (20 mg total) by mouth 2 (two) times daily. 11/06/12  Yes Bruce Romilda Garret, MD  glyBURIDE (DIABETA) 2.5 MG tablet Take 2.5 mg by mouth daily with breakfast.   Yes Historical Provider, MD  HYDROcodone-acetaminophen (NORCO/VICODIN) 5-325 MG per tablet Take 2 tablets by mouth every 4 (four) hours as needed for pain. 02/03/13  Yes Sunnie Nielsen, MD  insulin glargine (LANTUS SOLOSTAR) 100 UNIT/ML injection Inject 20 Units into the skin at bedtime. 07/18/12  Yes Bruce Romilda Garret, MD  LORazepam (ATIVAN) 1 MG tablet Take 1 mg by mouth 2 (two) times daily as needed for anxiety.   Yes Historical Provider, MD  losartan (COZAAR) 50 MG tablet Take 50 mg by mouth at bedtime.   Yes Historical Provider, MD  metFORMIN (GLUCOPHAGE) 1000 MG tablet Take 1 tablet (1,000 mg total) by mouth 2 (two) times daily with a meal. 02/15/12 02/14/13 Yes Bruce Romilda Garret, MD  Mometasone Furo-Formoterol Fum (DULERA) 200-5 MCG/ACT AERO Inhale 2 puffs  into the lungs 2 (two) times daily. 07/14/12  Yes Doe-Hyun R Artist Pais, DO  naproxen (NAPROSYN) 500 MG tablet Take 500 mg by mouth. Take 1 tablet twice daily for one week. Then as needed for muscle joint pain.   Yes Historical Provider, MD  nitroGLYCERIN (NITROSTAT) 0.4 MG SL tablet Place 0.4 mg under the tongue every 5 (five) minutes as needed. For chest pain.   Yes Historical Provider, MD  omeprazole (PRILOSEC) 20 MG capsule Take 20 mg by mouth at bedtime.   Yes Historical Provider, MD  potassium chloride SA (K-DUR,KLOR-CON) 20 MEQ tablet Take 20 mEq by mouth 3 (three) times daily.   Yes Historical Provider, MD  predniSONE (DELTASONE) 5 MG tablet  Take 12.5 mg by mouth daily.   Yes Historical Provider, MD  venlafaxine XR (EFFEXOR-XR) 75 MG 24 hr capsule Take 75 mg by mouth 2 (two) times daily.   Yes Historical Provider, MD   Physical Exam: Filed Vitals:   02/11/13 0600 02/11/13 0615 02/11/13 0645 02/11/13 0707  BP: 145/74 148/79  146/70  Pulse: 102 107 103   Temp:      TempSrc:      Resp: 20 20 19    SpO2: 96% 96% 95%      General:  Pt in NAD, Alert and Awake  Eyes: EOMI, non icteric  ENT: normal exterior appearance  Neck: supple, no goiter  Cardiovascular: RRR, No MRG  Respiratory: Rhales at bases R> L, no wheezes  Abdomen: soft, Nt, nd  Skin: warm and dry  Musculoskeletal: no cyanosis or clubbing  Psychiatric: mood and affect appropriate  Neurologic: moves all extremities equally, no facial asymmetry  Labs on Admission:  Basic Metabolic Panel:  Recent Labs Lab 02/11/13 0058  NA 140  K 4.0  CL 97  CO2 28  GLUCOSE 220*  BUN 15  CREATININE 0.56  CALCIUM 8.9   Liver Function Tests:  Recent Labs Lab 02/11/13 0058  AST 31  ALT 37*  ALKPHOS 144*  BILITOT 0.5  PROT 7.2  ALBUMIN 3.8   No results found for this basename: LIPASE, AMYLASE,  in the last 168 hours No results found for this basename: AMMONIA,  in the last 168 hours CBC:  Recent Labs Lab  02/11/13 0058  WBC 15.1*  HGB 10.9*  HCT 32.5*  MCV 90.3  PLT 354   Cardiac Enzymes: No results found for this basename: CKTOTAL, CKMB, CKMBINDEX, TROPONINI,  in the last 168 hours  BNP (last 3 results)  Recent Labs  02/02/13 2353 02/11/13 0058  PROBNP 108.8 437.1*   CBG: No results found for this basename: GLUCAP,  in the last 168 hours  Radiological Exams on Admission: Ct Angio Chest Pe W/cm &/or Wo Cm  02/11/2013  *RADIOLOGY REPORT*  Clinical Data: Shortness of breath on exertion  CT ANGIOGRAPHY CHEST  Technique:  Multidetector CT imaging of the chest using the standard protocol during bolus administration of intravenous contrast. Multiplanar reconstructed images including MIPs were obtained and reviewed to evaluate the vascular anatomy.  Contrast: OMNIPAQUE IOHEXOL 350 MG/ML SOLN  Comparison: 02/03/2013  Findings: Enlarged main pulmonary artery, measuring 3.4 cm.  The pulmonary arterial branch vessels are patent.  Normal caliber aorta.  Mild cardiomegaly.  Coronary artery calcification.  No pleural or pericardial effusion.  Postoperative changes at the GE junction.  Hepatic steatosis.  No intrathoracic lymphadenopathy.  Central airways are patent.  No pneumothorax.  No confluent airspace opacity.  5 mm right upper lobe nodular density on series 5 image 44.  No acute osseous finding.  IMPRESSION: No pulmonary embolism or acute intrathoracic process.  5 mm right upper lobe nodular density. If the patient is at high risk for bronchogenic carcinoma, follow-up chest CT at 6-12 months is recommended.  If the patient is at low risk for bronchogenic carcinoma, follow-up chest CT at 12 months is recommended.  This recommendation follows the consensus statement: Guidelines for Management of Small Pulmonary Nodules Detected on CT Scans: A Statement from the Fleischner Society as published in Radiology 2005; 237:395-400.  Cardiomegaly with coronary artery calcification.  Hepatic steatosis.    Original Report Authenticated By: Jearld Lesch, M.D.     EKG: Independently reviewed. Sinus rhythm with ectopic  beats, no ST elevation or depression  Assessment/Plan Active Problems:  1. Acute on Chronic diastolic CHF - Telemetry - troponin q 6 hr - daily weights, strict I/o's - lasix 40 mg IV bid - hold B blocker given acute decompensation  2. DM - glyburide, lantus, diabetic diet - monitor cbg's, continue SSI  3. GERD - Protonix - stable  4. Sarcoidosis of lung - continue home regimen and prednisone  5. HPL - Continue Statin while in house.  - continue ASA - stable  6. Gout - stable continue home regimen - stop nsaid given # 1   Code Status: Full code Family Communication: Discussed with patient and husband at bedside Disposition Plan:  Pending improvement in respiratory condition. Likely d/c in 1-2 days  Time spent: > 60 minutes  Penny Pia [facility] Pager [phone]  If 7PM-7AM, please contact night-coverage [email or web address] Password [facility] 02/11/2013, 8:27 AM

## 2013-02-11 NOTE — ED Notes (Signed)
Pt amb to BR states she voided a moderate amount.  Pt states she is feeling better. Is awaiting hospitalist for admission.

## 2013-02-11 NOTE — ED Notes (Signed)
Patient transported to CT 

## 2013-02-11 NOTE — ED Notes (Signed)
Family at bedside. 

## 2013-02-11 NOTE — ED Provider Notes (Signed)
History     CSN: [id number]  Arrival date & time 02/10/13  2327   First MD Initiated Contact with Patient 02/11/13 0244      Chief Complaint  Patient presents with  . Recurrent Skin Infections   HPI [name] is a 52 y.o. female history of sarcoidosis, diabetes mellitus on chronic prednisone therapy presents with lower extremity swelling. Patient's been evaluated in the emergency department for chest pain and lower extremity swelling and has seen her primary care physician Dr. Cato Mulligan for the same. Patient has had 2 courses of antibiotics for suspected lower extremity cellulitis. Patient presents today with worsening lower extremity swelling and pain, HEENT is throbbing, lower extremity, worse on walking, associated with shortness of breath and dyspnea on exertion, she has 3 pillow orthopnea, denies chest pain today, no nausea vomiting or diaphoresis.  No abdominal pain, headaches, fevers or chills.  Patient is also taking Lasix 40 mg twice a day that has failed to reduce her lower extremity edema. Patient last had an echocardiogram 2011 showing a normal EF. No history of hemoptysis, no history of venous thromboembolic disease, no history of cancer, no recent long periods of immobilization or long bone fractures.  Past Medical History  Diagnosis Date  . Sarcoidosis 04/10/2007  . DIABETES MELLITUS, TYPE II 08/21/2006  . HYPERLIPIDEMIA 08/21/2006  . GOUT 08/20/2010  . OBESITY 06/04/2009  . ANEMIA-UNSPECIFIED 09/18/2009  . HYPERTENSION 08/21/2006  . GERD 08/21/2006  . Shortness of breath   . SLEEP APNEA 04/21/2009    last testing was negative  . Internal hemorrhoids     Past Surgical History  Procedure Laterality Date  . Ventral hernia repair    . Nissen fundoplication    . Cholecystectomy    . Abdominal hysterectomy    . Knee arthroscopy      right  . Tubal ligation    . Bladder suspension  11/11/2011    Procedure: TRANSVAGINAL TAPE (TVT) PROCEDURE;  Surgeon: Levi Aland,  MD;  Location: [facility];  Service: Gynecology;  Laterality: N/A;  . Cystoscopy  11/11/2011    Procedure: CYSTOSCOPY;  Surgeon: Levi Aland, MD;  Location: [facility];  Service: Gynecology;  Laterality: N/A;    Family History  Problem Relation Age of Onset  . Diabetes Father   . Heart attack Father   . Coronary artery disease Father   . Heart failure Father   . COPD Mother   . Emphysema Mother   . Asthma Mother   . Heart failure Mother   . Sarcoidosis Maternal Uncle   . Colon cancer Neg Hx   . Lung cancer Brother   . Cancer Brother     History  Substance Use Topics  . Smoking status: Never Smoker   . Smokeless tobacco: Never Used  . Alcohol Use: No    OB History   Grav Para Term Preterm Abortions TAB SAB Ect Mult Living                  Review of Systems At least 10pt or greater review of systems completed and are negative except where specified in the HPI.  Allergies  Clindamycin/lincomycin; Lisinopril; and Methotrexate  Home Medications   Current Outpatient Rx  Name  Route  Sig  Dispense  Refill  . adalimumab (HUMIRA) 40 MG/0.8ML injection   Subcutaneous   Inject 40 mg into the skin every 14 (fourteen) days.         [name] albuterol (PROVENTIL HFA;VENTOLIN HFA)  108 (90 BASE) MCG/ACT inhaler   Inhalation   Inhale 2 puffs into the lungs every 6 (six) hours as needed. For wheezing.         [name] albuterol (PROVENTIL) (5 MG/ML) 0.5% nebulizer solution   Nebulization   Take 2.5 mg by nebulization every 6 (six) hours as needed. For wheezing.         [name] allopurinol (ZYLOPRIM) 100 MG tablet   Oral   Take 100 mg by mouth daily.         [name] aspirin EC 81 MG tablet   Oral   Take 81 mg by mouth daily.         [name] atenolol (TENORMIN) 50 MG tablet   Oral   Take 50 mg by mouth daily.         [name] atorvastatin (LIPITOR) 40 MG tablet   Oral   Take 40 mg by mouth at bedtime.         . benzonatate (TESSALON) 100 MG capsule   Oral   Take 1 capsule (100 mg total) by  mouth every 6 (six) hours as needed for cough.   60 capsule   4   . Bepotastine Besilate (BEPREVE) 1.5 % SOLN   Both Eyes   Place 1 drop into both eyes 2 (two) times daily.         [name] buPROPion (WELLBUTRIN XL) 300 MG 24 hr tablet   Oral   Take 300 mg by mouth daily.         . chlorpheniramine-HYDROcodone (TUSSIONEX) 10-8 MG/5ML LQCR   Oral   Take 10 mLs by mouth every 12 (twelve) hours as needed. For cough.   240 mL   0   . cholecalciferol (VITAMIN D) 1000 UNITS tablet   Oral   Take 1,000 Units by mouth 2 (two) times daily.         . ciclesonide (OMNARIS) 50 MCG/ACT nasal spray   Each Nare   Place 1 spray into both nostrils daily.          [name] dexlansoprazole (DEXILANT) 60 MG capsule   Oral   Take 1 capsule (60 mg total) by mouth daily.   30 capsule   6   . doxycycline (VIBRA-TABS) 100 MG tablet   Oral   Take 1 tablet (100 mg total) by mouth 2 (two) times daily.   10 tablet   0   . estradiol (ESTRACE) 2 MG tablet   Oral   Take 2 mg by mouth daily.         . fenofibrate (TRICOR) 145 MG tablet   Oral   Take 145 mg by mouth daily.           . furosemide (LASIX) 20 MG tablet   Oral   Take 1 tablet (20 mg total) by mouth 2 (two) times daily.   60 tablet   4   . glyBURIDE (DIABETA) 2.5 MG tablet   Oral   Take 2.5 mg by mouth daily with breakfast.         . HYDROcodone-acetaminophen (NORCO/VICODIN) 5-325 MG per tablet   Oral   Take 2 tablets by mouth every 4 (four) hours as needed for pain.   10 tablet   0   . insulin glargine (LANTUS SOLOSTAR) 100 UNIT/ML injection   Subcutaneous   Inject 20 Units into the skin at bedtime.   1 pen   3   . LORazepam (ATIVAN) 1 MG tablet   Oral  Take 1 mg by mouth 2 (two) times daily as needed for anxiety.         [name] losartan (COZAAR) 50 MG tablet   Oral   Take 50 mg by mouth at bedtime.         . metFORMIN (GLUCOPHAGE) 1000 MG tablet   Oral   Take 1 tablet (1,000 mg total) by mouth 2 (two) times  daily with a meal.   180 tablet   3   . Mometasone Furo-Formoterol Fum (DULERA) 200-5 MCG/ACT AERO   Inhalation   Inhale 2 puffs into the lungs 2 (two) times daily.   8.8 g   1   . naproxen (NAPROSYN) 500 MG tablet   Oral   Take 500 mg by mouth. Take 1 tablet twice daily for one week. Then as needed for muscle joint pain.         . nitroGLYCERIN (NITROSTAT) 0.4 MG SL tablet   Sublingual   Place 0.4 mg under the tongue every 5 (five) minutes as needed. For chest pain.         [name] omeprazole (PRILOSEC) 20 MG capsule   Oral   Take 20 mg by mouth at bedtime.         . potassium chloride SA (K-DUR,KLOR-CON) 20 MEQ tablet   Oral   Take 20 mEq by mouth 3 (three) times daily.         . predniSONE (DELTASONE) 5 MG tablet   Oral   Take 12.5 mg by mouth daily.         [name] venlafaxine XR (EFFEXOR-XR) 75 MG 24 hr capsule   Oral   Take 75 mg by mouth 2 (two) times daily.           BP 137/68  Pulse 100  Temp(Src) 98 F (36.7 C) (Oral)  Resp 21  SpO2 98%  Physical Exam  Nursing notes reviewed.  Electronic medical record reviewed. VITAL SIGNS:   Filed Vitals:   02/11/13 0330 02/11/13 0400 02/11/13 0430 02/11/13 0500  BP: 134/81 145/70 141/61 137/68  Pulse: 103 97 105 100  Temp:      TempSrc:      Resp: 16 22 18 21   SpO2: 97% 97% 95% 98%   CONSTITUTIONAL: Awake, oriented, appears nontoxic but uncomfortable, cushingoid HENT: Atraumatic, normocephalic, oral mucosa pink and moist, airway patent. Nares patent without drainage. External ears normal. EYES: Conjunctiva clear, EOMI, PERRLA NECK: Trachea midline, non-tender, supple CARDIOVASCULAR: Normal heart rate, Normal rhythm, No murmurs, rubs, gallops PULMONARY/CHEST: Clear to auscultation, no rhonchi, wheezes, or rales. Symmetrical breath sounds. Non-tender. ABDOMINAL: Non-distended, obese, soft, non-tender - no rebound or guarding.  BS normal. NEUROLOGIC: Non-focal, moving all four extremities, no gross sensory or  motor deficits. EXTREMITIES: No clubbing, cyanosis. 3+ lower extremity pitting edema mild petechial changes the lower extremities, some redness suggestive of venous stasis SKIN: Warm, Dry, No erythema, No rash  ED Course  Procedures (including critical care time)  Date: 02/11/2013  Rate: 104  Rhythm: sinus tachycardia  QRS Axis: normal  Intervals: QTC is 500  ST/T Wave abnormalities: normal  Conduction Disutrbances: none  Narrative Interpretation: Sinus tachycardia     Labs Reviewed  PRO B NATRIURETIC PEPTIDE - Abnormal; Notable for the following:    Pro B Natriuretic peptide (BNP) 437.1 (*)    All other components within normal limits  CBC - Abnormal; Notable for the following:    WBC 15.1 (*)    RBC 3.60 (*)  Hemoglobin 10.9 (*)    HCT 32.5 (*)    All other components within normal limits  COMPREHENSIVE METABOLIC PANEL - Abnormal; Notable for the following:    Glucose, Bld 220 (*)    ALT 37 (*)    Alkaline Phosphatase 144 (*)    All other components within normal limits  D-DIMER, QUANTITATIVE - Abnormal; Notable for the following:    D-Dimer, Quant 0.64 (*)    All other components within normal limits  URINALYSIS, ROUTINE W REFLEX MICROSCOPIC  POCT I-STAT TROPONIN I   Ct Angio Chest Pe W/cm &/or Wo Cm  02/11/2013  *RADIOLOGY REPORT*  Clinical Data: Shortness of breath on exertion  CT ANGIOGRAPHY CHEST  Technique:  Multidetector CT imaging of the chest using the standard protocol during bolus administration of intravenous contrast. Multiplanar reconstructed images including MIPs were obtained and reviewed to evaluate the vascular anatomy.  Contrast: OMNIPAQUE IOHEXOL 350 MG/ML SOLN  Comparison: 02/03/2013  Findings: Enlarged main pulmonary artery, measuring 3.4 cm.  The pulmonary arterial branch vessels are patent.  Normal caliber aorta.  Mild cardiomegaly.  Coronary artery calcification.  No pleural or pericardial effusion.  Postoperative changes at the GE junction.   Hepatic steatosis.  No intrathoracic lymphadenopathy.  Central airways are patent.  No pneumothorax.  No confluent airspace opacity.  5 mm right upper lobe nodular density on series 5 image 44.  No acute osseous finding.  IMPRESSION: No pulmonary embolism or acute intrathoracic process.  5 mm right upper lobe nodular density. If the patient is at high risk for bronchogenic carcinoma, follow-up chest CT at 6-12 months is recommended.  If the patient is at low risk for bronchogenic carcinoma, follow-up chest CT at 12 months is recommended.  This recommendation follows the consensus statement: Guidelines for Management of Small Pulmonary Nodules Detected on CT Scans: A Statement from the Fleischner Society as published in Radiology 2005; 237:395-400.  Cardiomegaly with coronary artery calcification.  Hepatic steatosis.   Original Report Authenticated By: Jearld Lesch, M.D.      1. Dyspnea on exertion   2. Lower extremity edema   3. Sarcoid       MDM  [name] is a 52 y.o. female presenting with painful swollen lower extremities and dyspnea on exertion, 3 pillow orthopnea, concern for new onset or progression of heart failure.  Also consider pulmonary embolism-d-dimer was mildly elevated, with the patient's tachycardia and dyspnea obtain CT angiogram of the chest which was negative for acute pulmonary embolism. No pneumonia detected. Patient's BNP was elevated over prior values.  Patient was ambulate with SpO2 monitor, patient was tachycardic into the 130s on ambulation and severely dyspneic after walking 20 feet. Troponin is unremarkable, patient is otherwise hemodynamically stable, patient will be admitted to the hospital for further workup and diuresis.           Jones Skene, MD 02/11/13 [phone]

## 2013-02-11 NOTE — ED Notes (Signed)
Pt c/o that her legs are more swollen than before despite following all of the directions she was given before.  Also c/o shob with walking. Husband at bedside.

## 2013-02-11 NOTE — ED Notes (Signed)
Dr. Bonk at bedside 

## 2013-02-11 NOTE — ED Notes (Signed)
Pt sat 98 with ambulation HR up to 128. Pt c/o shob and had to stop and sit after approx 100 ft

## 2013-02-12 DIAGNOSIS — I5033 Acute on chronic diastolic (congestive) heart failure: Secondary | ICD-10-CM

## 2013-02-12 DIAGNOSIS — D869 Sarcoidosis, unspecified: Secondary | ICD-10-CM

## 2013-02-12 DIAGNOSIS — Z23 Encounter for immunization: Secondary | ICD-10-CM | POA: Diagnosis not present

## 2013-02-12 DIAGNOSIS — E119 Type 2 diabetes mellitus without complications: Secondary | ICD-10-CM

## 2013-02-12 DIAGNOSIS — M109 Gout, unspecified: Secondary | ICD-10-CM

## 2013-02-12 LAB — BASIC METABOLIC PANEL
BUN: 12 mg/dL (ref 6–23)
CO2: 30 mEq/L (ref 19–32)
Calcium: 8.5 mg/dL (ref 8.4–10.5)
Chloride: 95 mEq/L — ABNORMAL LOW (ref 96–112)
Creatinine, Ser: 0.49 mg/dL — ABNORMAL LOW (ref 0.50–1.10)
GFR calc Af Amer: >90 mL/min (ref >90)
GFR calc non Af Amer: >90 mL/min (ref >90)
Glucose, Bld: 145 mg/dL — ABNORMAL HIGH (ref 70–99)
Potassium: 3.6 mEq/L (ref 3.5–5.1)
Sodium: 138 mEq/L (ref 135–145)

## 2013-02-12 LAB — GLUCOSE, CAPILLARY
Glucose-Capillary: 131 mg/dL — ABNORMAL HIGH (ref 70–99)
Glucose-Capillary: 231 mg/dL — ABNORMAL HIGH (ref 70–99)
Glucose-Capillary: 272 mg/dL — ABNORMAL HIGH (ref 70–99)
Glucose-Capillary: 218 mg/dL — ABNORMAL HIGH (ref 70–99)

## 2013-02-12 MED ORDER — PNEUMOCOCCAL VAC POLYVALENT 25 MCG/0.5ML IJ INJ
0.5000 mL | INJECTION | INTRAMUSCULAR | Status: AC
  Administered 2013-02-13: 0.5 mL via INTRAMUSCULAR
  Filled 2013-02-12: qty 0.5

## 2013-02-12 NOTE — Progress Notes (Signed)
[facility] PROGRESS NOTE  [name] ZOX:[id number] DOB: 09/18/1961 DOA: 02/11/2013 PCP: [name], MD  Assessment/Plan: 1. Acute on Chronic diastolic CHF - Telemetry  - troponin q 6 hr x 3 negative. - daily weights, strict I/o's, net loss 1,725 ml - will continue lasix 40 mg IV bid  - hold B blocker given acute decompensation  - Echocardiogram pending  2. DM  - glyburide, lantus, diabetic diet  - monitor cbg's, continue SSI   3. GERD  - Protonix  - stable   4. Sarcoidosis of lung  - continue home regimen and prednisone   5. HPL - Continue Statin while in house.  - continue ASA  - stable   6. Gout  - stable continue home regimen  - stop nsaid given # 1  Code Status: full Family Communication:  No family at bedside Disposition Plan: Pending improvement in condition.   Consultants:  None  Procedures:  Echocardiogram   Antibiotics:   (indicate start date, and stop date if known)  HPI/Subjective: States that her legs feel better. No acute issues overnight. Reported.  Objective: Filed Vitals:   02/11/13 2020 02/12/13 0153 02/12/13 0732 02/12/13 1028  BP: 159/56 155/79  133/76  Pulse: 96 103  101  Temp: 98.4 F (36.9 C) 98.4 F (36.9 C)    TempSrc: Oral Oral    Resp: 20 19    Height:      Weight:      SpO2: 98% 97% 99%     Intake/Output Summary (Last 24 hours) at 02/12/13 1503 Last data filed at 02/12/13 1400  Gross per 24 hour  Intake   1020 ml  Output   1800 ml  Net   -780 ml   Filed Weights   02/11/13 0929  Weight: 124.4 kg (274 lb 4 oz)    Exam:   General:  Pt in NAD, Alert and Awake  Cardiovascular: RRR, No rubs  Respiratory: no wheezes, decreased breath sounds at bases  Abdomen: soft, NT, ND  Musculoskeletal: no cyanosis or clubbing   Data Reviewed: Basic Metabolic Panel:  Recent Labs Lab 02/11/13 0058 02/11/13 0922 02/12/13 0500  NA 140  --  138  K 4.0  --  3.6  CL 97  --  95*  CO2 28  --   30  GLUCOSE 220*  --  145*  BUN 15  --  12  CREATININE 0.56 0.59 0.49*  CALCIUM 8.9  --  8.5  MG  --  1.2*  --    Liver Function Tests:  Recent Labs Lab 02/11/13 0058  AST 31  ALT 37*  ALKPHOS 144*  BILITOT 0.5  PROT 7.2  ALBUMIN 3.8   No results found for this basename: LIPASE, AMYLASE,  in the last 168 hours No results found for this basename: AMMONIA,  in the last 168 hours CBC:  Recent Labs Lab 02/11/13 0058 02/11/13 0922  WBC 15.1* 13.7*  HGB 10.9* 11.7*  HCT 32.5* 34.9*  MCV 90.3 88.8  PLT 354 366   Cardiac Enzymes:  Recent Labs Lab 02/11/13 0922 02/11/13 1450 02/11/13 2030  TROPONINI <0.30 <0.30 <0.30   BNP (last 3 results)  Recent Labs  02/02/13 2353 02/11/13 0058  PROBNP 108.8 437.1*   CBG:  Recent Labs Lab 02/11/13 1116 02/11/13 1617 02/11/13 2050 02/12/13 0615 02/12/13 1128  GLUCAP 171* 175* 239* 131* 231*    No results found for this or any previous visit (from the past 240 hour(s)).  Studies: Ct Angio Chest Pe W/cm &/or Wo Cm  02/11/2013  *RADIOLOGY REPORT*  Clinical Data: Shortness of breath on exertion  CT ANGIOGRAPHY CHEST  Technique:  Multidetector CT imaging of the chest using the standard protocol during bolus administration of intravenous contrast. Multiplanar reconstructed images including MIPs were obtained and reviewed to evaluate the vascular anatomy.  Contrast: OMNIPAQUE IOHEXOL 350 MG/ML SOLN  Comparison: 02/03/2013  Findings: Enlarged main pulmonary artery, measuring 3.4 cm.  The pulmonary arterial branch vessels are patent.  Normal caliber aorta.  Mild cardiomegaly.  Coronary artery calcification.  No pleural or pericardial effusion.  Postoperative changes at the GE junction.  Hepatic steatosis.  No intrathoracic lymphadenopathy.  Central airways are patent.  No pneumothorax.  No confluent airspace opacity.  5 mm right upper lobe nodular density on series 5 image 44.  No acute osseous finding.  IMPRESSION: No  pulmonary embolism or acute intrathoracic process.  5 mm right upper lobe nodular density. If the patient is at high risk for bronchogenic carcinoma, follow-up chest CT at 6-12 months is recommended.  If the patient is at low risk for bronchogenic carcinoma, follow-up chest CT at 12 months is recommended.  This recommendation follows the consensus statement: Guidelines for Management of Small Pulmonary Nodules Detected on CT Scans: A Statement from the Fleischner Society as published in Radiology 2005; 237:395-400.  Cardiomegaly with coronary artery calcification.  Hepatic steatosis.   Original Report Authenticated By: Jearld Lesch, M.D.     Scheduled Meds: . allopurinol  100 mg Oral Daily  . aspirin EC  81 mg Oral Daily  . atorvastatin  40 mg Oral QHS  . buPROPion  300 mg Oral Daily  . cholecalciferol  1,000 Units Oral BID  . doxycycline  100 mg Oral BID  . estradiol  2 mg Oral Daily  . fluticasone  2 spray Each Nare Daily  . furosemide  40 mg Intravenous BID  . glyBURIDE  2.5 mg Oral Q breakfast  . heparin  5,000 Units Subcutaneous Q8H  . insulin aspart  0-15 Units Subcutaneous TID WC  . insulin glargine  20 Units Subcutaneous QHS  . losartan  50 mg Oral QHS  . mometasone-formoterol  2 puff Inhalation BID  . olopatadine  1 drop Both Eyes BID  . pantoprazole  40 mg Oral Daily  . predniSONE  12.5 mg Oral Q breakfast  . sodium chloride  3 mL Intravenous Q12H  . venlafaxine XR  75 mg Oral BID   Continuous Infusions:   Active Problems:   Sarcoidosis of lung   HYPERLIPIDEMIA   GOUT   HYPERTENSION   GERD   DIABETES MELLITUS, TYPE II   Acute on chronic diastolic congestive heart failure    Time spent: > 35 minutes    Penny Pia  [facility] Pager [phone] If 7PM-7AM, please contact night-coverage at [email or web address], password [facility] 02/12/2013, 3:03 PM  LOS: 1 day

## 2013-02-12 NOTE — Care Management Note (Signed)
    Page 1 of 1   02/12/2013     10:27:38 AM   CARE MANAGEMENT NOTE 02/12/2013  Patient:  ODALYS, WIN   Account Number:  [id number]  Date Initiated:  02/12/2013  Documentation initiated by:  [facility]  Subjective/Objective Assessment:   52 y.o. female  With h/o Diastolic CHF grade I, HTN, DM, HPL, who presented to the ED complaining of worsening SOB     Action/Plan:   Diurese/ Home with Home Health [facility])   Anticipated DC Date:  02/14/2013   Anticipated DC Plan:  HOME W HOME HEALTH SERVICES      DC Planning Services  CM consult      [facility] Choice  HOME HEALTH   Choice offered to / List presented to:  C-1 Patient        HH arranged  HH-1 RN  HH-10 DISEASE MANAGEMENT      HH agency  [facility]   Status of service:  Completed, signed off Medicare Important Message given?   (If response is "NO", the following Medicare IM given date fields will be blank) Date Medicare IM given:   Date Additional Medicare IM given:    Discharge Disposition:    Per UR Regulation:  Reviewed for med. necessity/level of care/duration of stay  If discussed at Long Length of Stay Meetings, dates discussed:    Comments:  02/12/13 @ 1000.Marland KitchenMarland KitchenOletta Cohn, RN, BSN, [employer] [phone] Spoke with pt regarding discharge planning.  Offered list of [facility] agencies.  Pt chose [facility] to render services.  Corrie Dandy of [name] notified.  No DME needs identified at this time.

## 2013-02-12 NOTE — Progress Notes (Signed)
*  PRELIMINARY RESULTS* Echocardiogram 2D Echocardiogram has been performed.  [name] 02/12/2013, 2:47 PM

## 2013-02-12 NOTE — Progress Notes (Signed)
Utilization Review Completed Debi Cousin J. Wandy Bossler, RN, BSN, NCM [phone]  

## 2013-02-13 DIAGNOSIS — R0609 Other forms of dyspnea: Secondary | ICD-10-CM | POA: Diagnosis not present

## 2013-02-13 DIAGNOSIS — R059 Cough, unspecified: Secondary | ICD-10-CM | POA: Diagnosis not present

## 2013-02-13 DIAGNOSIS — Z23 Encounter for immunization: Secondary | ICD-10-CM | POA: Diagnosis not present

## 2013-02-13 DIAGNOSIS — M109 Gout, unspecified: Secondary | ICD-10-CM | POA: Diagnosis not present

## 2013-02-13 DIAGNOSIS — I5033 Acute on chronic diastolic (congestive) heart failure: Secondary | ICD-10-CM | POA: Diagnosis not present

## 2013-02-13 DIAGNOSIS — R05 Cough: Secondary | ICD-10-CM | POA: Diagnosis not present

## 2013-02-13 DIAGNOSIS — R0989 Other specified symptoms and signs involving the circulatory and respiratory systems: Secondary | ICD-10-CM | POA: Diagnosis not present

## 2013-02-13 LAB — BASIC METABOLIC PANEL
BUN: 14 mg/dL (ref 6–23)
CO2: 31 mEq/L (ref 19–32)
Calcium: 8.7 mg/dL (ref 8.4–10.5)
Chloride: 98 mEq/L (ref 96–112)
Creatinine, Ser: 0.55 mg/dL (ref 0.50–1.10)
GFR calc Af Amer: >90 mL/min (ref >90)
GFR calc non Af Amer: >90 mL/min (ref >90)
Glucose, Bld: 145 mg/dL — ABNORMAL HIGH (ref 70–99)
Potassium: 3.9 mEq/L (ref 3.5–5.1)
Sodium: 141 mEq/L (ref 135–145)

## 2013-02-13 LAB — GLUCOSE, CAPILLARY
Glucose-Capillary: 131 mg/dL — ABNORMAL HIGH (ref 70–99)
Glucose-Capillary: 224 mg/dL — ABNORMAL HIGH (ref 70–99)

## 2013-02-13 MED ORDER — FUROSEMIDE 20 MG PO TABS: 30.0000 mg | ORAL_TABLET | Freq: Two times a day (BID) | ORAL | Status: DC

## 2013-02-13 NOTE — Discharge Summary (Signed)
Physician Discharge Summary  BREANNE OLVERA UJW:[id number] DOB: [birth year] DOA: 02/11/2013  PCP: [name], MD  Admit date: 02/11/2013 Discharge date: 02/13/2013  Time spent: > 35 minutes  Recommendations for Outpatient Follow-up:  1) Please be sure to follow creatinine level although on day of discharge was 0.55 despite being on lasix 40 mg IV BID while in house 2.) Also be sure to f/u with blood sugars 3) Adjust diuretics as needed based on clinical presentation  Discharge Diagnoses:  Active Problems:   Sarcoidosis of lung   HYPERLIPIDEMIA   GOUT   HYPERTENSION   GERD   DIABETES MELLITUS, TYPE II   Acute on chronic diastolic congestive heart failure   Discharge Condition: Stable  Diet recommendation: Heart healthy/diabetic diet  Filed Weights   02/11/13 0929 02/13/13 0620  Weight: 124.4 kg (274 lb 4 oz) 121.02 kg (266 lb 12.8 oz)    History of present illness:  52 y/o with h/o Diastolic CHF grade I, DM, HTN, HPL, and sarcoidosis on chronic prednisone and leukocytosis as a result.  Presenting to the ED complaining of worsening SOB for the last 3 days.  Hospital Course:  1. Acute on Chronic diastolic CHF - Telemetry  - troponin q 6 hr x 3 negative.  - daily weights, strict I/o's, net loss > 4 liters  - will continue lasix 30 mg po bid while at home.  Discussed fluid restriction and daily weights at home. - Will continue B blocker given improvement in condition. - Echocardiogram reviewed with EF 50-55% patient to follow up with cardiologist or pcp on discharge for further evaluation and recommendations.  2. DM  - Pt to continue home regimen and is to follow up with her pcp for further adjustments depending on blood sugars.  3. GERD  - stable, to continue home regimen.  4. Sarcoidosis of lung  - continue home regimen and prednisone  - chronic prednisone use most likey cause of leukocytosis. Patient afebrile while in house.  5. HPL - Continue Statin while in  house.  - continue ASA  - stable   6. Gout  - stable continue home regimen  - stop nsaid given # 1   Procedures:  2 D echocardiogram  Consultations:  None  Discharge Exam: Filed Vitals:   02/12/13 2009 02/12/13 2100 02/13/13 0620 02/13/13 0707  BP:  155/88 164/87 150/86  Pulse:  105 101 98  Temp:  97.8 F (36.6 C) 97.6 F (36.4 C)   TempSrc:  Oral Oral   Resp:  20 20   Height:      Weight:   121.02 kg (266 lb 12.8 oz)   SpO2: 98% 99% 97%     General: Pt in NAD, Alert and Awake Cardiovascular: RRR, No MRG Respiratory: CTA BL, no wheezes, no rhales  Discharge Instructions  Discharge Orders   Future Appointments Provider Department Dept Phone   03/14/2013 8:00 AM Lindley Magnus, MD [facility] [phone]   03/16/2013 1:45 PM Storm Frisk, MD [facility] [phone]   06/12/2013 9:00 AM Lindley Magnus, MD  [facility] [place] [phone]   Future Orders Complete By Expires     (HEART FAILURE PATIENTS) Call MD:  Anytime you have any of the following symptoms: 1) 3 pound weight gain in 24 hours or 5 pounds in 1 week 2) shortness of breath, with or without a dry hacking cough 3) swelling in the hands, feet or stomach 4) if you have to sleep on  extra pillows at night in order to breathe.  As directed     Call MD for:  severe uncontrolled pain  As directed     Call MD for:  temperature >100.4  As directed     Diet - low sodium heart healthy  As directed     Discharge instructions  As directed     Comments:      Please avoid NSAIDs like naproxen as these can exacerbate heart failure.  Follow up with your primary care physician on discharge in 1-2 week or sooner.  I recommend that you get your BMP checked within the next week to assess your potassium and creatinine levels.    Face-to-face encounter (required for Medicare/Medicaid patients)  As directed     Comments:      I Angelyse Heslin certify that this patient is under my  care and that I, or a nurse practitioner or physician's assistant working with me, had a face-to-face encounter that meets the physician face-to-face encounter requirements with this patient on 02/13/2013. The encounter with the patient was in whole, or in part for the following medical condition(s) which is the primary reason for home health care (List medical condition):  Patient will require physical therapy for recent CHF exacerbation.    Questions:      The encounter with the patient was in whole, or in part, for the following medical condition, which is the primary reason for home health care:  diastolic heart failure exacerbation    I certify that, based on my findings, the following services are medically necessary home health services:  Physical therapy    Nursing    My clinical findings support the need for the above services:  Shortness of breath with activity    Further, I certify that my clinical findings support that this patient is homebound due to:  Leaving home exacerbates symptoms (dyspnea, pain, anxiety, etc)    Reason for Medically Necessary Home Health Services:  Therapy- Therapeutic Exercises to Increase Strength and Endurance    Heart failure home health orders  As directed     Comments:      Heart Failure Follow-up Care:  Verify follow-up appointments per Patient Discharge Instructions. Confirm transportation arranged. Reconcile home medications with discharge medication list. Remove discontinued medications from use. Assist patient/caregiver to manage medications using pill box. Reinforce low sodium food selection Assessments: Vital signs and oxygen saturation at each visit. Assess home environment for safety concerns, caregiver support and availability of low-sodium foods. Consult Child psychotherapist, PT/OT, Dietitian, and CNA based on assessments. Perform comprehensive cardiopulmonary assessment. Notify MD for any change in condition or weight gain of 3 pounds in one day or 5  pounds in one week with symptoms. Daily Weights and Symptom Monitoring: Ensure patient has access to scales. Teach patient/caregiver to weigh daily before breakfast and after voiding using same scale and record.    Teach patient/caregiver to track weight and symptoms and when to notify Provider. Activity: Develop individualized activity plan with patient/caregiver.    Questions:      Skilled Nurse to notify MD of weight trends weekly for first 2 weeks. May fax or call:  [facility] at [phone] (fax) or [phone]    Heart Failure Follow-up Care:  [facility] ([facility] at [phone]    Home Health Visits:      Obtain the following labs:  Basic Metabolic Panel    Lab frequency:  Weekly    Fax lab results to:  [facility] at [phone]    Diet:  Low Sodium Heart Healthy    Fluid restrictions:  1800 mL Fluid    Increase activity slowly  As directed         Medication List    STOP taking these medications       naproxen 500 MG tablet  Commonly known as:  NAPROSYN      TAKE these medications       albuterol 108 (90 BASE) MCG/ACT inhaler  Commonly known as:  PROVENTIL HFA;VENTOLIN HFA  Inhale 2 puffs into the lungs every 6 (six) hours as needed. For wheezing.     albuterol (5 MG/ML) 0.5% nebulizer solution  Commonly known as:  PROVENTIL  Take 2.5 mg by nebulization every 6 (six) hours as needed. For wheezing.     allopurinol 100 MG tablet  Commonly known as:  ZYLOPRIM  Take 100 mg by mouth daily.     aspirin EC 81 MG tablet  Take 81 mg by mouth daily.     atenolol 50 MG tablet  Commonly known as:  TENORMIN  Take 50 mg by mouth daily.     atorvastatin 40 MG tablet  Commonly known as:  LIPITOR  Take 40 mg by mouth at bedtime.     benzonatate 100 MG capsule  Commonly known as:  TESSALON  Take 1 capsule (100 mg total) by mouth every 6 (six) hours as needed for cough.     BEPREVE 1.5 % Soln  Generic drug:  Bepotastine Besilate  Place 1 drop into both eyes 2 (two)  times daily.     buPROPion 300 MG 24 hr tablet  Commonly known as:  WELLBUTRIN XL  Take 300 mg by mouth daily.     chlorpheniramine-HYDROcodone 10-8 MG/5ML Lqcr  Commonly known as:  TUSSIONEX  Take 10 mLs by mouth every 12 (twelve) hours as needed. For cough.     cholecalciferol 1000 UNITS tablet  Commonly known as:  VITAMIN D  Take 1,000 Units by mouth 2 (two) times daily.     ciclesonide 50 MCG/ACT nasal spray  Commonly known as:  OMNARIS  Place 1 spray into both nostrils daily.     dexlansoprazole 60 MG capsule  Commonly known as:  DEXILANT  Take 1 capsule (60 mg total) by mouth daily.     doxycycline 100 MG tablet  Commonly known as:  VIBRA-TABS  Take 1 tablet (100 mg total) by mouth 2 (two) times daily.     estradiol 2 MG tablet  Commonly known as:  ESTRACE  Take 2 mg by mouth daily.     fenofibrate 145 MG tablet  Commonly known as:  TRICOR  Take 145 mg by mouth daily.     furosemide 20 MG tablet  Commonly known as:  LASIX  Take 1.5 tablets (30 mg total) by mouth 2 (two) times daily.     glyBURIDE 2.5 MG tablet  Commonly known as:  DIABETA  Take 2.5 mg by mouth daily with breakfast.     HUMIRA 40 MG/0.8ML injection  Generic drug:  adalimumab  Inject 40 mg into the skin every 14 (fourteen) days.     HYDROcodone-acetaminophen 5-325 MG per tablet  Commonly known as:  NORCO/VICODIN  Take 2 tablets by mouth every 4 (four) hours as needed for pain.     insulin glargine 100 UNIT/ML injection  Commonly known as:  LANTUS SOLOSTAR  Inject 20 Units into the skin at bedtime.     LORazepam  1 MG tablet  Commonly known as:  ATIVAN  Take 1 mg by mouth 2 (two) times daily as needed for anxiety.     losartan 50 MG tablet  Commonly known as:  COZAAR  Take 50 mg by mouth at bedtime.     metFORMIN 1000 MG tablet  Commonly known as:  GLUCOPHAGE  Take 1 tablet (1,000 mg total) by mouth 2 (two) times daily with a meal.     mometasone-formoterol 200-5 MCG/ACT Aero   Commonly known as:  DULERA  Inhale 2 puffs into the lungs 2 (two) times daily.     nitroGLYCERIN 0.4 MG SL tablet  Commonly known as:  NITROSTAT  Place 0.4 mg under the tongue every 5 (five) minutes as needed. For chest pain.     omeprazole 20 MG capsule  Commonly known as:  PRILOSEC  Take 20 mg by mouth at bedtime.     potassium chloride SA 20 MEQ tablet  Commonly known as:  K-DUR,KLOR-CON  Take 20 mEq by mouth 3 (three) times daily.     predniSONE 5 MG tablet  Commonly known as:  DELTASONE  Take 12.5 mg by mouth daily.     venlafaxine XR 75 MG 24 hr capsule  Commonly known as:  EFFEXOR-XR  Take 75 mg by mouth 2 (two) times daily.       Allergies  Allergen Reactions  . Clindamycin/Lincomycin     Nausea, rash  . Lisinopril Cough  . Methotrexate     REACTION: peri-oral and buccal lesions.      The results of significant diagnostics from this hospitalization (including imaging, microbiology, ancillary and laboratory) are listed below for reference.    Significant Diagnostic Studies: Dg Chest 2 View  02/03/2013  *RADIOLOGY REPORT*  Clinical Data: Chest pain  CHEST - 2 VIEW  Comparison: CT 12/17/2012, chest radiograph 12/16/2012  Findings: Epigastric clips are noted. Cardiomediastinal silhouette is within normal limits. The lungs are clear. No pleural effusion. No pneumothorax.  No acute osseous abnormality.  Lungs are hypoaerated.  IMPRESSION: No acute cardiopulmonary process.   Original Report Authenticated By: Christiana Pellant, M.D.    Ct Angio Chest Pe W/cm &/or Wo Cm  02/11/2013  *RADIOLOGY REPORT*  Clinical Data: Shortness of breath on exertion  CT ANGIOGRAPHY CHEST  Technique:  Multidetector CT imaging of the chest using the standard protocol during bolus administration of intravenous contrast. Multiplanar reconstructed images including MIPs were obtained and reviewed to evaluate the vascular anatomy.  Contrast: OMNIPAQUE IOHEXOL 350 MG/ML SOLN  Comparison:  02/03/2013  Findings: Enlarged main pulmonary artery, measuring 3.4 cm.  The pulmonary arterial branch vessels are patent.  Normal caliber aorta.  Mild cardiomegaly.  Coronary artery calcification.  No pleural or pericardial effusion.  Postoperative changes at the GE junction.  Hepatic steatosis.  No intrathoracic lymphadenopathy.  Central airways are patent.  No pneumothorax.  No confluent airspace opacity.  5 mm right upper lobe nodular density on series 5 image 44.  No acute osseous finding.  IMPRESSION: No pulmonary embolism or acute intrathoracic process.  5 mm right upper lobe nodular density. If the patient is at high risk for bronchogenic carcinoma, follow-up chest CT at 6-12 months is recommended.  If the patient is at low risk for bronchogenic carcinoma, follow-up chest CT at 12 months is recommended.  This recommendation follows the consensus statement: Guidelines for Management of Small Pulmonary Nodules Detected on CT Scans: A Statement from the Fleischner Society as published in Radiology 2005; 237:395-400.  Cardiomegaly with coronary artery calcification.  Hepatic steatosis.   Original Report Authenticated By: Jearld Lesch, M.D.    Ct Angio Chest Pe W/cm &/or Wo Cm  02/03/2013  *RADIOLOGY REPORT*  Clinical Data: Back pain  CT ANGIOGRAPHY CHEST  Technique:  Multidetector CT imaging of the chest using the standard protocol during bolus administration of intravenous contrast. Multiplanar reconstructed images including MIPs were obtained and reviewed to evaluate the vascular anatomy.  Contrast: OMNIPAQUE IOHEXOL 350 MG/ML SOLN  Comparison: 12/17/2012  Findings: Lungs/pleura: There is no pleural effusion identified. No airspace consolidation or atelectasis identified.  There is no suspicious pulmonary parenchymal nodule or mass identified.  Heart/Mediastinum: Moderate cardiac enlargement.  Calcifications within the LAD coronary artery noted.  No pericardial effusion.  No enlarged mediastinal  or hilar lymph nodes.  The main pulmonary artery is patent.  There is no abnormal filling defect within the lumbar or segmental pulmonary arteries to suggest a clinically significant pulmonary embolus.  Upper abdomen: Mild diffuse low attenuation throughout the liver parenchyma is identified consistent with fatty infiltration.  Bones/Musculoskeletal:  Review of the visualized osseous structures is remarkable for spondylosis within the thoracic spine.  No aggressive bone lesions identified.  IMPRESSION:  1.  No evidence for acute pulmonary embolus. 2.  Cardiac enlargement.   Original Report Authenticated By: Signa Kell, M.D.     Microbiology: No results found for this or any previous visit (from the past 240 hour(s)).   Labs: Basic Metabolic Panel:  Recent Labs Lab 02/11/13 0058 02/11/13 0922 02/12/13 0500 02/13/13 0430  NA 140  --  138 141  K 4.0  --  3.6 3.9  CL 97  --  95* 98  CO2 28  --  30 31  GLUCOSE 220*  --  145* 145*  BUN 15  --  12 14  CREATININE 0.56 0.59 0.49* 0.55  CALCIUM 8.9  --  8.5 8.7  MG  --  1.2*  --   --    Liver Function Tests:  Recent Labs Lab 02/11/13 0058  AST 31  ALT 37*  ALKPHOS 144*  BILITOT 0.5  PROT 7.2  ALBUMIN 3.8   No results found for this basename: LIPASE, AMYLASE,  in the last 168 hours No results found for this basename: AMMONIA,  in the last 168 hours CBC:  Recent Labs Lab 02/11/13 0058 02/11/13 0922  WBC 15.1* 13.7*  HGB 10.9* 11.7*  HCT 32.5* 34.9*  MCV 90.3 88.8  PLT 354 366   Cardiac Enzymes:  Recent Labs Lab 02/11/13 0922 02/11/13 1450 02/11/13 2030  TROPONINI <0.30 <0.30 <0.30   BNP: BNP (last 3 results)  Recent Labs  02/02/13 2353 02/11/13 0058  PROBNP 108.8 437.1*   CBG:  Recent Labs Lab 02/12/13 1128 02/12/13 1626 02/12/13 2139 02/13/13 0621 02/13/13 1133  GLUCAP 231* 272* 218* 131* 224*       Signed:  Penny Pia  [facility] 02/13/2013, 11:46 AM

## 2013-02-13 NOTE — Progress Notes (Signed)
Dc instructions given to pt at this time re:  F/u appts, diet, activity, CHF, meds, s/s of problems, my chart and community resources.  Pt verbalized understanding of all instructions.  No s/s of any acute distress noted at dc

## 2013-02-14 ENCOUNTER — Telehealth: Payer: Self-pay | Admitting: Internal Medicine

## 2013-02-14 NOTE — Telephone Encounter (Signed)
FYI:  Pt was d/c'd from hospital yesterday for congestive heart failure.  Pt to see Dr Selena Batten on 5/13 for that fup.  Pt was in hospital previously 3rd week in April and saw Dr Selena Batten for that also.

## 2013-02-15 ENCOUNTER — Telehealth: Payer: Self-pay | Admitting: Internal Medicine

## 2013-02-15 NOTE — Telephone Encounter (Signed)
[name] has opened this pt up for home health cardio pulmonary services. They will be sending orders, if any questions, please call Faith @ [phone]   Ext [phone]

## 2013-02-20 ENCOUNTER — Telehealth: Payer: Self-pay | Admitting: Family Medicine

## 2013-02-20 ENCOUNTER — Ambulatory Visit (INDEPENDENT_AMBULATORY_CARE_PROVIDER_SITE_OTHER): Payer: BC Managed Care – PPO | Admitting: Family Medicine

## 2013-02-20 VITALS — BP 144/80 | Temp 98.1°F | Wt 261.0 lb

## 2013-02-20 DIAGNOSIS — R609 Edema, unspecified: Secondary | ICD-10-CM | POA: Diagnosis not present

## 2013-02-20 DIAGNOSIS — R0609 Other forms of dyspnea: Secondary | ICD-10-CM | POA: Diagnosis not present

## 2013-02-20 DIAGNOSIS — R0600 Dyspnea, unspecified: Secondary | ICD-10-CM

## 2013-02-20 DIAGNOSIS — R197 Diarrhea, unspecified: Secondary | ICD-10-CM

## 2013-02-20 DIAGNOSIS — R911 Solitary pulmonary nodule: Secondary | ICD-10-CM

## 2013-02-20 DIAGNOSIS — R0989 Other specified symptoms and signs involving the circulatory and respiratory systems: Secondary | ICD-10-CM | POA: Diagnosis not present

## 2013-02-20 DIAGNOSIS — R600 Localized edema: Secondary | ICD-10-CM

## 2013-02-20 DIAGNOSIS — R112 Nausea with vomiting, unspecified: Secondary | ICD-10-CM | POA: Diagnosis not present

## 2013-02-20 LAB — BASIC METABOLIC PANEL
BUN: 10 mg/dL (ref 6–23)
CO2: 28 mEq/L (ref 19–32)
Calcium: 8.7 mg/dL (ref 8.4–10.5)
Chloride: 98 mEq/L (ref 96–112)
Creatinine, Ser: 0.6 mg/dL (ref 0.4–1.2)
GFR: 110.91 mL/min (ref >60.00)
Glucose, Bld: 199 mg/dL — ABNORMAL HIGH (ref 70–99)
Potassium: 3.8 mEq/L (ref 3.5–5.1)
Sodium: 139 mEq/L (ref 135–145)

## 2013-02-20 NOTE — Telephone Encounter (Signed)
Please let her know kidneys look fine. BS a little high. Advise increasing lantus by 3 units and having her monitor fasting morning BS. If running abpve 130 or having lows she should let her PCP know.

## 2013-02-20 NOTE — Telephone Encounter (Signed)
Left a message for pt to return call 

## 2013-02-20 NOTE — Progress Notes (Addendum)
Chief Complaint  Patient presents with  . POST HOSPITAL FOLLOW UP    HPI:  Hospital Follow Up: -could not get appt with PCP -admitted 02/11/13 for worsening SOB for 3 days. -ACS, PE, infection r/o and tx for AoCDCHF with lasix -discharged 5/6 -discharge note says pt needed follow up with PCP to check Cr, check on blood sugars and adjust diuretic if needed - discharged on lasix 30mg  bid -Echo showed LVH,  LV function ok with EF 50-55%, diastolic function not evaluated -CXR and CT PE study negative except incidental 5mm nodule with recs to repeat CT in 6-12 months -BNP in 400s initially -patient reports: breathing has improved, still has some fatigue, swelling in legs has improved, has had a little nausea, vomiting and intermittent diarrhea - this has been going on for almost a month, DOE mild -denies: fevers, cough, acid reflux, - did lose some weight with the diuresis, CP, orthopnea, urinary symptoms, blood in stools or melena, apnea, snoring -she is followed by Dr. Marina Goodell in GI for her GERD -blood sugars have been running 130-140  - she is on prednisone - followed by Dr. Riley Kill for her sarcoid, has appt coming up -she used to see Dr. Clarene Duke at [facility] cards in cards for heart issues in the past - reports had a cath in 2012 - reports had bad stress test results in the past   ROS: See pertinent positives and negatives per HPI.  Past Medical History  Diagnosis Date  . Sarcoidosis 04/10/2007  . DIABETES MELLITUS, TYPE II 08/21/2006  . HYPERLIPIDEMIA 08/21/2006  . GOUT 08/20/2010  . OBESITY 06/04/2009  . ANEMIA-UNSPECIFIED 09/18/2009  . HYPERTENSION 08/21/2006  . GERD 08/21/2006  . Shortness of breath   . SLEEP APNEA 04/21/2009    last testing was negative  . Internal hemorrhoids   . Acute on chronic diastolic congestive heart failure 02/11/2013    Family History  Problem Relation Age of Onset  . Diabetes Father   . Heart attack Father   . Coronary artery disease Father   .  Heart failure Father   . COPD Mother   . Emphysema Mother   . Asthma Mother   . Heart failure Mother   . Sarcoidosis Maternal Uncle   . Colon cancer Neg Hx   . Lung cancer Brother   . Cancer Brother     History   Social History  . Marital Status: Married    Spouse Name: N/A    Number of Children: N/A  . Years of Education: N/A   Occupational History  . DISABLED    Social History Main Topics  . Smoking status: Never Smoker   . Smokeless tobacco: Never Used  . Alcohol Use: No  . Drug Use: No  . Sexually Active: Yes   Other Topics Concern  . Not on file   Social History Narrative  . No narrative on file    Current outpatient prescriptions:adalimumab (HUMIRA) 40 MG/0.8ML injection, Inject 40 mg into the skin every 14 (fourteen) days., Disp: , Rfl: ;  albuterol (PROVENTIL HFA;VENTOLIN HFA) 108 (90 BASE) MCG/ACT inhaler, Inhale 2 puffs into the lungs every 6 (six) hours as needed. For wheezing., Disp: , Rfl: ;  albuterol (PROVENTIL) (5 MG/ML) 0.5% nebulizer solution, Take 2.5 mg by nebulization every 6 (six) hours as needed. For wheezing., Disp: , Rfl:  allopurinol (ZYLOPRIM) 100 MG tablet, Take 100 mg by mouth daily., Disp: , Rfl: ;  aspirin EC 81 MG tablet, Take 81 mg by  mouth daily., Disp: , Rfl: ;  atenolol (TENORMIN) 50 MG tablet, Take 50 mg by mouth daily., Disp: , Rfl: ;  atorvastatin (LIPITOR) 40 MG tablet, Take 40 mg by mouth at bedtime., Disp: , Rfl: ;  Bepotastine Besilate (BEPREVE) 1.5 % SOLN, Place 1 drop into both eyes 2 (two) times daily., Disp: , Rfl:  buPROPion (WELLBUTRIN XL) 300 MG 24 hr tablet, Take 300 mg by mouth daily., Disp: , Rfl: ;  chlorpheniramine-HYDROcodone (TUSSIONEX) 10-8 MG/5ML LQCR, Take 10 mLs by mouth every 12 (twelve) hours as needed. For cough., Disp: 240 mL, Rfl: 0;  cholecalciferol (VITAMIN D) 1000 UNITS tablet, Take 1,000 Units by mouth 2 (two) times daily., Disp: , Rfl:  ciclesonide (OMNARIS) 50 MCG/ACT nasal spray, Place 1 spray into both  nostrils daily. , Disp: , Rfl: ;  dexlansoprazole (DEXILANT) 60 MG capsule, Take 1 capsule (60 mg total) by mouth daily., Disp: 30 capsule, Rfl: 6;  estradiol (ESTRACE) 2 MG tablet, Take 2 mg by mouth daily., Disp: , Rfl: ;  fenofibrate (TRICOR) 145 MG tablet, Take 145 mg by mouth daily.  , Disp: , Rfl:  furosemide (LASIX) 20 MG tablet, Take 1.5 tablets (30 mg total) by mouth 2 (two) times daily., Disp: 60 tablet, Rfl: 0;  glyBURIDE (DIABETA) 2.5 MG tablet, Take 2.5 mg by mouth daily with breakfast., Disp: , Rfl: ;  HYDROcodone-acetaminophen (NORCO/VICODIN) 5-325 MG per tablet, Take 2 tablets by mouth every 4 (four) hours as needed for pain., Disp: 10 tablet, Rfl: 0 insulin glargine (LANTUS SOLOSTAR) 100 UNIT/ML injection, Inject 20 Units into the skin at bedtime., Disp: 1 pen, Rfl: 3;  LORazepam (ATIVAN) 1 MG tablet, Take 1 mg by mouth 2 (two) times daily as needed for anxiety., Disp: , Rfl: ;  losartan (COZAAR) 50 MG tablet, Take 50 mg by mouth at bedtime., Disp: , Rfl: ;  Mometasone Furo-Formoterol Fum (DULERA) 200-5 MCG/ACT AERO, Inhale 2 puffs into the lungs 2 (two) times daily., Disp: 8.8 g, Rfl: 1 nitroGLYCERIN (NITROSTAT) 0.4 MG SL tablet, Place 0.4 mg under the tongue every 5 (five) minutes as needed. For chest pain., Disp: , Rfl: ;  omeprazole (PRILOSEC) 20 MG capsule, Take 20 mg by mouth at bedtime., Disp: , Rfl: ;  potassium chloride SA (K-DUR,KLOR-CON) 20 MEQ tablet, Take 20 mEq by mouth 3 (three) times daily., Disp: , Rfl: ;  predniSONE (DELTASONE) 5 MG tablet, Take 12.5 mg by mouth daily., Disp: , Rfl:  venlafaxine XR (EFFEXOR-XR) 75 MG 24 hr capsule, Take 75 mg by mouth 2 (two) times daily., Disp: , Rfl: ;  benzonatate (TESSALON) 100 MG capsule, Take 1 capsule (100 mg total) by mouth every 6 (six) hours as needed for cough., Disp: 60 capsule, Rfl: 4;  doxycycline (VIBRA-TABS) 100 MG tablet, Take 1 tablet (100 mg total) by mouth 2 (two) times daily., Disp: 10 tablet, Rfl: 0 metFORMIN (GLUCOPHAGE)  1000 MG tablet, Take 1 tablet (1,000 mg total) by mouth 2 (two) times daily with a meal., Disp: 180 tablet, Rfl: 3  EXAM:  Filed Vitals:   02/20/13 0946  BP: 144/80  Temp: 98.1 F (36.7 C)    Body mass index is 42.15 kg/(m^2).  GENERAL: vitals reviewed and listed above, alert, oriented, appears well hydrated and in no acute distress  HEENT: atraumatic, conjunttiva clear, no obvious abnormalities on inspection of external nose and ears  NECK: no obvious masses on inspection, no JVD  LUNGS: clear to auscultation bilaterally, no wheezes, rales or rhonchi, good air  movement  CV: HRRR, no peripheral edema  MS: moves all extremities without noticeable abnormality  PSYCH: pleasant and cooperative, no obvious depression or anxiety  ASSESSMENT AND PLAN:  Discussed the following assessment and plan:  Solitary pulmonary nodule, on CT 02/2013 - recs to repeat imaging in 6-12 months  Dyspnea on exertion - Plan: Basic metabolic panel  Lower extremity edema - Plan: Basic metabolic panel  Nausea with vomiting  Diarrhea - Plan: Basic metabolic panel  -check BMP -continue diuretic if tolerating well for 1 week, then decrease to 20mg  bid until sees cards given improved symptoms, but continued DOE, ? Heart issues in the past, ? DHF. She will monitor weight and sodium intake and call if worsening or symptoms. -she has cardiologist, she will contact them for appointment -she will contact her gastroenterologist for evaluation for ongoing diarrhea, nausea and vomiting -BS stable home, continue current regimen and continue to monitor BS -pulm nodule added to problem list, may be related to her sarcoid - she will discuss with PCP -follow up with rheum as scheduled -follow up with PCP at next available for routine follow up -Patient advised to return or notify a doctor immediately if symptoms worsen or persist or new concerns arise.  There are no Patient Instructions on file for this  visit.   Kriste Basque R.

## 2013-02-21 NOTE — Telephone Encounter (Signed)
Called and spoke with pt and pt is aware.  

## 2013-02-23 ENCOUNTER — Telehealth: Payer: Self-pay | Admitting: *Deleted

## 2013-02-23 NOTE — Telephone Encounter (Addendum)
Roxanne, RN from [facility] called and said that pt was seen by Dr Selena Batten for post hosp f/u on 02/20/13.  Her BP is 176/94 she had SOB yesterday but resolved quickly.  She has bilateral ankle edema.  She took atenolol 50 mg and lasix 30 mg 2 hours ago.  Lungs are clear.  Pt weighs 260 this morning and weighed 261 on Tuesday at OV.  Consulted with Dr Selena Batten and she stated that it was nothing critical since this seems to be a chronic issue with pt and she needs to f/u with cardiology.  Hx of CHF.  Dr Selena Batten felt like Dr Cato Mulligan should be notified.  Caleb Popp, RN back and told her what Dr Selena Batten said and nurse stated that pt has not established with Dr Clarene Duke yet. She has appt with Dr Clarene Duke 03/06/13. Told nurse I would call pt as soon as I received instructions from Dr Cato Mulligan.

## 2013-02-26 ENCOUNTER — Encounter: Payer: Self-pay | Admitting: Internal Medicine

## 2013-02-26 ENCOUNTER — Ambulatory Visit (INDEPENDENT_AMBULATORY_CARE_PROVIDER_SITE_OTHER): Payer: BC Managed Care – PPO | Admitting: Internal Medicine

## 2013-02-26 VITALS — BP 162/88 | HR 100 | Temp 98.0°F | Wt 260.0 lb

## 2013-02-26 DIAGNOSIS — I10 Essential (primary) hypertension: Secondary | ICD-10-CM | POA: Diagnosis not present

## 2013-02-26 DIAGNOSIS — IMO0002 Reserved for concepts with insufficient information to code with codable children: Secondary | ICD-10-CM

## 2013-02-26 DIAGNOSIS — E1149 Type 2 diabetes mellitus with other diabetic neurological complication: Secondary | ICD-10-CM | POA: Diagnosis not present

## 2013-02-26 DIAGNOSIS — I5033 Acute on chronic diastolic (congestive) heart failure: Secondary | ICD-10-CM | POA: Diagnosis not present

## 2013-02-26 DIAGNOSIS — E1142 Type 2 diabetes mellitus with diabetic polyneuropathy: Secondary | ICD-10-CM | POA: Diagnosis not present

## 2013-02-26 DIAGNOSIS — I509 Heart failure, unspecified: Secondary | ICD-10-CM

## 2013-02-26 DIAGNOSIS — E1165 Type 2 diabetes mellitus with hyperglycemia: Secondary | ICD-10-CM

## 2013-02-26 MED ORDER — CARVEDILOL 6.25 MG PO TABS: 6.2500 mg | ORAL_TABLET | Freq: Two times a day (BID) | ORAL | Status: DC

## 2013-02-26 MED ORDER — VALSARTAN 160 MG PO TABS: 160.0000 mg | ORAL_TABLET | Freq: Every day | ORAL | Status: DC

## 2013-02-26 NOTE — Assessment & Plan Note (Signed)
Acute symptoms improved, however I suggest we further optimize her antihypertensive regimen. Discontinue atenolol. Switch to carvedilol 6.25 mg twice daily. Discontinue losartan. Switch to valsartan 160 mg once daily.  Patient to monitor her blood pressure and pulse at home. Reassess in 2 weeks

## 2013-02-26 NOTE — Progress Notes (Signed)
Subjective:    Patient ID: [name] Mohs, female    DOB: [birth year], 52 y.o.   MRN: [id number]  HPI  52 year old white female with complex medical history including sarcoidosis, uncontrolled type 2 diabetes, sleep apnea acute on chronic diastolic congestive heart failure for followup. Patient admitted to hospital in early May secondary to his symptoms of bilateral lower extremity swelling and dyspnea on exertion. Patient presumed to have congestive heart failure. She was diuresed approximately 10 pounds. She underwent 2D echocardiogram. It showed normal ejection fraction, however it was technically difficult due to her body habitus.  Patient reports her edema has significantly improved. She is currently taking furosemide 30 mg twice daily. She still has dyspnea with exertion.  Type 2 diabetes-blood sugars are poorly controlled. Her recent A1c 8.3.  Her blood control complicated by use of prednisone.  Lab Results  Component Value Date   HGBA1C 8.3* 01/16/2013   Hypertension - BP elevated despite taking her current Antihypertensive regimen.  Review of Systems Negative for chest pain. Her previous cardiologist - Dr. Clarene Duke.  Patient reports normal cardiac cath in the past    Past Medical History  Diagnosis Date  . Sarcoidosis 04/10/2007  . DIABETES MELLITUS, TYPE II 08/21/2006  . HYPERLIPIDEMIA 08/21/2006  . GOUT 08/20/2010  . OBESITY 06/04/2009  . ANEMIA-UNSPECIFIED 09/18/2009  . HYPERTENSION 08/21/2006  . GERD 08/21/2006  . Shortness of breath   . SLEEP APNEA 04/21/2009    last testing was negative  . Internal hemorrhoids   . Acute on chronic diastolic congestive heart failure 02/11/2013    History   Social History  . Marital Status: Married    Spouse Name: N/A    Number of Children: N/A  . Years of Education: N/A   Occupational History  . DISABLED    Social History Main Topics  . Smoking status: Never Smoker   . Smokeless tobacco: Never Used  . Alcohol Use: No  . Drug  Use: No  . Sexually Active: Yes   Other Topics Concern  . Not on file   Social History Narrative  . No narrative on file    Past Surgical History  Procedure Laterality Date  . Ventral hernia repair    . Nissen fundoplication    . Cholecystectomy    . Abdominal hysterectomy    . Knee arthroscopy      right  . Tubal ligation    . Bladder suspension  11/11/2011    Procedure: TRANSVAGINAL TAPE (TVT) PROCEDURE;  Surgeon: Levi Aland, MD;  Location: [facility];  Service: Gynecology;  Laterality: N/A;  . Cystoscopy  11/11/2011    Procedure: CYSTOSCOPY;  Surgeon: Levi Aland, MD;  Location: [facility];  Service: Gynecology;  Laterality: N/A;    Family History  Problem Relation Age of Onset  . Diabetes Father   . Heart attack Father   . Coronary artery disease Father   . Heart failure Father   . COPD Mother   . Emphysema Mother   . Asthma Mother   . Heart failure Mother   . Sarcoidosis Maternal Uncle   . Colon cancer Neg Hx   . Lung cancer Brother   . Cancer Brother     Allergies  Allergen Reactions  . Clindamycin/Lincomycin     Nausea, rash  . Lisinopril Cough  . Methotrexate     REACTION: peri-oral and buccal lesions.    Current Outpatient Prescriptions on File Prior to Visit  Medication Sig Dispense Refill  .  adalimumab (HUMIRA) 40 MG/0.8ML injection Inject 40 mg into the skin every 14 (fourteen) days.      [name] albuterol (PROVENTIL HFA;VENTOLIN HFA) 108 (90 BASE) MCG/ACT inhaler Inhale 2 puffs into the lungs every 6 (six) hours as needed. For wheezing.      [name] albuterol (PROVENTIL) (5 MG/ML) 0.5% nebulizer solution Take 2.5 mg by nebulization every 6 (six) hours as needed. For wheezing.      [name] allopurinol (ZYLOPRIM) 100 MG tablet Take 100 mg by mouth daily.      [name] aspirin EC 81 MG tablet Take 81 mg by mouth daily.      [name] atorvastatin (LIPITOR) 40 MG tablet Take 40 mg by mouth at bedtime.      . benzonatate (TESSALON) 100 MG capsule Take 1 capsule (100 mg total) by mouth  every 6 (six) hours as needed for cough.  60 capsule  4  . Bepotastine Besilate (BEPREVE) 1.5 % SOLN Place 1 drop into both eyes 2 (two) times daily.      [name] buPROPion (WELLBUTRIN XL) 300 MG 24 hr tablet Take 300 mg by mouth daily.      . chlorpheniramine-HYDROcodone (TUSSIONEX) 10-8 MG/5ML LQCR Take 10 mLs by mouth every 12 (twelve) hours as needed. For cough.  240 mL  0  . cholecalciferol (VITAMIN D) 1000 UNITS tablet Take 1,000 Units by mouth 2 (two) times daily.      . ciclesonide (OMNARIS) 50 MCG/ACT nasal spray Place 1 spray into both nostrils daily.       [name] dexlansoprazole (DEXILANT) 60 MG capsule Take 1 capsule (60 mg total) by mouth daily.  30 capsule  6  . estradiol (ESTRACE) 2 MG tablet Take 2 mg by mouth daily.      . fenofibrate (TRICOR) 145 MG tablet Take 145 mg by mouth daily.        . furosemide (LASIX) 20 MG tablet Take 1.5 tablets (30 mg total) by mouth 2 (two) times daily.  60 tablet  0  . glyBURIDE (DIABETA) 2.5 MG tablet Take 2.5 mg by mouth daily with breakfast.      . HYDROcodone-acetaminophen (NORCO/VICODIN) 5-325 MG per tablet Take 2 tablets by mouth every 4 (four) hours as needed for pain.  10 tablet  0  . insulin glargine (LANTUS SOLOSTAR) 100 UNIT/ML injection Inject 20 Units into the skin at bedtime.  1 pen  3  . LORazepam (ATIVAN) 1 MG tablet Take 1 mg by mouth 2 (two) times daily as needed for anxiety.      . Mometasone Furo-Formoterol Fum (DULERA) 200-5 MCG/ACT AERO Inhale 2 puffs into the lungs 2 (two) times daily.  8.8 g  1  . nitroGLYCERIN (NITROSTAT) 0.4 MG SL tablet Place 0.4 mg under the tongue every 5 (five) minutes as needed. For chest pain.      [name] omeprazole (PRILOSEC) 20 MG capsule Take 20 mg by mouth at bedtime.      . potassium chloride SA (K-DUR,KLOR-CON) 20 MEQ tablet Take 20 mEq by mouth 3 (three) times daily.      . predniSONE (DELTASONE) 5 MG tablet Take 12.5 mg by mouth daily.      [name] venlafaxine XR (EFFEXOR-XR) 75 MG 24 hr capsule Take 75 mg by  mouth 2 (two) times daily.       No current facility-administered medications on file prior to visit.    BP 162/88  Pulse 100  Temp(Src) 98 F (36.7 C) (Oral)  Wt 260 lb (117.935 kg)  BMI 41.99 kg/m2    Objective:   Physical Exam  Constitutional: She is oriented to person, place, and time. She appears well-developed and well-nourished.  HENT:  Head: Normocephalic and atraumatic.  Neck: Neck supple.  No carotid bruit  Cardiovascular: Normal rate and intact distal pulses.   tachycardic  Pulmonary/Chest: Effort normal and breath sounds normal. She has no wheezes. She has no rales.  Musculoskeletal:  Trace lower extremity edema bilaterally  Neurological: She is alert and oriented to person, place, and time. No cranial nerve deficit.  Psychiatric: She has a normal mood and affect. Her behavior is normal.          Assessment & Plan:

## 2013-02-26 NOTE — Assessment & Plan Note (Signed)
Patient has poorly controlled type 2 diabetes. She likely has diabetic polyneuropathy. She is having intermittent nausea. Decrease metformin to 500 mg twice daily. Refer to endocrinology for further management. Lab Results  Component Value Date   HGBA1C 8.3* 01/16/2013   Lab Results  Component Value Date   CREATININE 0.6 02/20/2013

## 2013-02-26 NOTE — Telephone Encounter (Signed)
Left message for pt to call back  °

## 2013-02-26 NOTE — Telephone Encounter (Signed)
Make sure she is doing ok.

## 2013-02-26 NOTE — Patient Instructions (Signed)
Please complete the following lab tests before your next follow up appointment: BMET, A1c, Microalb / Cr ratio - 250.02 BNP - 786.05

## 2013-02-26 NOTE — Telephone Encounter (Signed)
[name] going to see Dr Artist Pais today for hypertension

## 2013-03-02 ENCOUNTER — Encounter: Payer: Self-pay | Admitting: *Deleted

## 2013-03-03 ENCOUNTER — Encounter: Payer: Self-pay | Admitting: Cardiology

## 2013-03-06 ENCOUNTER — Ambulatory Visit (INDEPENDENT_AMBULATORY_CARE_PROVIDER_SITE_OTHER): Payer: BC Managed Care – PPO | Admitting: Cardiology

## 2013-03-06 ENCOUNTER — Encounter: Payer: Self-pay | Admitting: Cardiology

## 2013-03-06 ENCOUNTER — Other Ambulatory Visit (INDEPENDENT_AMBULATORY_CARE_PROVIDER_SITE_OTHER): Payer: BC Managed Care – PPO

## 2013-03-06 VITALS — BP 126/70 | HR 88 | Ht 66.0 in | Wt 268.2 lb

## 2013-03-06 DIAGNOSIS — E785 Hyperlipidemia, unspecified: Secondary | ICD-10-CM | POA: Diagnosis not present

## 2013-03-06 DIAGNOSIS — D860 Sarcoidosis of lung: Secondary | ICD-10-CM

## 2013-03-06 DIAGNOSIS — I10 Essential (primary) hypertension: Secondary | ICD-10-CM | POA: Diagnosis not present

## 2013-03-06 DIAGNOSIS — I509 Heart failure, unspecified: Secondary | ICD-10-CM | POA: Diagnosis not present

## 2013-03-06 DIAGNOSIS — R0602 Shortness of breath: Secondary | ICD-10-CM

## 2013-03-06 DIAGNOSIS — IMO0001 Reserved for inherently not codable concepts without codable children: Secondary | ICD-10-CM

## 2013-03-06 DIAGNOSIS — D869 Sarcoidosis, unspecified: Secondary | ICD-10-CM

## 2013-03-06 DIAGNOSIS — J99 Respiratory disorders in diseases classified elsewhere: Secondary | ICD-10-CM

## 2013-03-06 DIAGNOSIS — R079 Chest pain, unspecified: Secondary | ICD-10-CM

## 2013-03-06 DIAGNOSIS — I5033 Acute on chronic diastolic (congestive) heart failure: Secondary | ICD-10-CM

## 2013-03-06 DIAGNOSIS — E669 Obesity, unspecified: Secondary | ICD-10-CM

## 2013-03-06 LAB — BASIC METABOLIC PANEL
BUN: 15 mg/dL (ref 6–23)
CO2: 31 mEq/L (ref 19–32)
Calcium: 9 mg/dL (ref 8.4–10.5)
Chloride: 98 mEq/L (ref 96–112)
Creatinine, Ser: 0.6 mg/dL (ref 0.4–1.2)
GFR: 115.32 mL/min (ref >60.00)
Glucose, Bld: 91 mg/dL (ref 70–99)
Potassium: 3.8 mEq/L (ref 3.5–5.1)
Sodium: 139 mEq/L (ref 135–145)

## 2013-03-06 LAB — HEMOGLOBIN A1C: Hgb A1c MFr Bld: 7.2 % — ABNORMAL HIGH (ref 4.6–6.5)

## 2013-03-06 LAB — MICROALBUMIN / CREATININE URINE RATIO
Creatinine,U: 177.3 mg/dL
Microalb Creat Ratio: 0.6 mg/g (ref 0.0–30.0)
Microalb, Ur: 1 mg/dL (ref 0.0–1.9)

## 2013-03-06 MED ORDER — LOSARTAN POTASSIUM 100 MG PO TABS: 100.0000 mg | ORAL_TABLET | Freq: Every day | ORAL | Status: DC

## 2013-03-06 MED ORDER — FUROSEMIDE 40 MG PO TABS: 60.0000 mg | ORAL_TABLET | Freq: Two times a day (BID) | ORAL | Status: DC

## 2013-03-06 NOTE — Patient Instructions (Addendum)
Your physician has recommended you make the following change in your medication lasix 40 mg twice a day until  You get your dry weight  Which is 262 lbs on your scale at home.  Sliding scale Lasix: Weigh yourself when you get home, then Daily in the Morning.   If you gain more than 3 pounds from dry weight: Increase the Lasix dosing to Lasix 40 mg twice  until weight returns to baseline dry weight.  If weight gain is greater than 5 pounds in 2 days: Increased to 60 mg in the morning and 40 mg in the afternoon and contact the office for further assistance if weight does not go down the next day.  If the weight goes down more than 3 pounds from dry weight: Hold Lasix until it returns to baseline dry weight   Do not start Diovan ,restart Losartan 25 mg daily.   Your physician has requested that you have a lexiscan myoview. For further information please visit [email or web address] Please follow instruction sheet, as given.  Follow up with Dr Herbie Baltimore in 1 month.

## 2013-03-06 NOTE — Assessment & Plan Note (Addendum)
Wgt back up from d/c dry wgt of ~260-262 lb. while in the patient, she had diarrhea throughout the 11 pounds, and has now gained almost 8 back. Increase Lasix to 40 mg bid unitl back to dry wgt, then change to 40 mg AM & 20 mg PM. Sliding Scale Lasix. Sliding scale Lasix: Weigh yourself when you get home, then Daily in the Morning. Your dry weight following her hospitalization was 260 - 262 lbs.).   For weight gain more than 3 pounds from dry weight: Increase the Lasix dosing to 40 mg twice a day until weight returns to baseline dry weight.  If weight gain is greater than 5 pounds in 2 days: Increased to Lasix 60 mg in the morning and 40 mg in the afternoonand contact the office for further assistance if weight does not go down the next day.  If the weight goes down more than 3 pounds from dry weight: Hold Lasix until it returns to baseline dry weight

## 2013-03-06 NOTE — Assessment & Plan Note (Addendum)
Looks pretty good on Carvedilol 6.25 mg bid - ok to just continue Losartan -- will Rx 25 mg daily.  Can f/u BP in 1 month after Cardiolite.

## 2013-03-06 NOTE — Assessment & Plan Note (Signed)
Has had non-ischemic ST in 2012 & non-occlusive CAD on cath in 2011.  Currently notes nocturnal dyspnea with chest heaviness -- most likely related to RV volume overload & wall stress, but will need to exclude new CAD. Lexiscan Cardiolite.

## 2013-03-06 NOTE — Progress Notes (Signed)
Patient ID: [name], female   DOB: 06-27-1961, 52 y.o.   MRN: [id number] [facility]   Clinic Note: HPI: [name] is a 52 y.o. female with a PMH of obesity, pulmonary sarcoid (followed both locally by Dr. Delford Field as well as is at [facility] by Dr. Sandy Salaam), hypertension, likely chronic diastolic heart failure (with a pre-significant cardiac evaluation in the past including a negative/nonobstructive coronary disease catheterization in October 2011 -- with LVEDP of 22; she also Myoview given nonischemic in 2012.) who presents today for followup her recent hospitalization in early May for what amounted to be diastolic heart failure exacerbation notable edema and weight gain. She was diuresed roughly 11 pounds during the cardiac hospitalization. She an echogram that demonstrated a normal ejection fraction of 50-50% but likely diastolic dysfunction. Unfortunately her inpatient echocardiogram did not allow for accurate evaluation of ventricular diastolic function as well as pulmonary pressures.  Interval History: She initially did relatively well after discharge, but over last couple days she's noted worsening lower some edema with symptoms of PND and 3 pillow orthopnea (which is not necessarily new). She is also noted a heaviness sensation in her chest that comes and goes, most notable when she lies down in bed at night. It also does seem to be worsened with exertion, and she describes feeling short of breath simply walking across a room. Initially following her discharge she did not have any orthopnea or PND. Currently prior to the admission she had been evaluated for possible cellulitis and was treated with antibiotics which did not help. That's when she was admitted with the concern for possible heart failure.  The remainder of her Cardiovascular ROS is as follows:: positive for - dyspnea on exertion, edema, orthopnea, paroxysmal nocturnal dyspnea and shortness of  breath negative for - irregular heartbeat, loss of consciousness, murmur, palpitations or rapid heart rate  She denies any lightheadedness  Past Medical History  Diagnosis Date  . DIABETES MELLITUS, TYPE II 08/21/2006  . HYPERLIPIDEMIA 08/21/2006  . GOUT 08/20/2010  . OBESITY 06/04/2009  . ANEMIA-UNSPECIFIED 09/18/2009  . HYPERTENSION 08/21/2006  . GERD 08/21/2006  . Shortness of breath   . SLEEP APNEA 04/21/2009    last testing was negative  . Internal hemorrhoids   . Acute on chronic diastolic congestive heart failure 02/11/2013  . Pulmonary sarcoidosis   . Exertional chest pain     sharp, substernal, exertional  . Vocal cord dysfunction     Prior cardiac evaluation and past surgical history: Past Surgical History  Procedure Laterality Date  . Ventral hernia repair    . Nissen fundoplication  2004  . Cholecystectomy  1984  . Abdominal hysterectomy    . Knee arthroscopy      right  . Tubal ligation      with reversal in 1994  . Bladder suspension  11/11/2011    Procedure: TRANSVAGINAL TAPE (TVT) PROCEDURE;  Surgeon: Levi Aland, MD;  Location: [facility];  Service: Gynecology;  Laterality: N/A;  . Cystoscopy  11/11/2011    Procedure: CYSTOSCOPY;  Surgeon: Levi Aland, MD;  Location: [facility];  Service: Gynecology;  Laterality: N/A;  . Doppler echocardiography  09/11/11    LV FUNCTION, SIZE NORMAL; LV WALL THICKNESS WAS INCREASED IN A PATTERN OF MILD LVH; THERE WAS CONCENTRIC HYPERTROPHY. SYSTOLIC FUINCTION NORMAL; EST EF 55-65%; WALL MOTION NORMAL  . Myocardial perfusion  09/17/11    EF 69%; NO SIGNIFICANT WALL ABNORMALITIES NOTED; NORMAL MYOCARDIAL  PERFUSION STUDY  . Cardiac catheterization  07/2010    LVEF 50-55% WITH VERY MILD GLOBAL HYPOKINESIA; ESSENTIALLY NORMAL CORONARY ARTERIES; NORMAL LV FUNCTION  . Carotids  02/18/11    CAROTID DUPLEX; VERTEBRALS ARE PATENT WITH ANTEGRADE FLOW. ICA/CCA RATIO 1.61 ON RIGHT AND 0.75 ON LEFT    Allergies  Allergen Reactions  .  Clindamycin/Lincomycin     Nausea, rash  . Lisinopril Cough  . Methotrexate     REACTION: peri-oral and buccal lesions.    Current Outpatient Prescriptions  Medication Sig Dispense Refill  . adalimumab (HUMIRA) 40 MG/0.8ML injection Inject 40 mg into the skin every 14 (fourteen) days.      [name] albuterol (PROVENTIL HFA;VENTOLIN HFA) 108 (90 BASE) MCG/ACT inhaler Inhale 2 puffs into the lungs every 6 (six) hours as needed. For wheezing.      [name] albuterol (PROVENTIL) (5 MG/ML) 0.5% nebulizer solution Take 2.5 mg by nebulization every 6 (six) hours as needed. For wheezing.      [name] allopurinol (ZYLOPRIM) 100 MG tablet Take 100 mg by mouth daily.      [name] aspirin EC 81 MG tablet Take 81 mg by mouth daily.      [name] atorvastatin (LIPITOR) 40 MG tablet Take 40 mg by mouth at bedtime.      . benzonatate (TESSALON) 100 MG capsule Take 1 capsule (100 mg total) by mouth every 6 (six) hours as needed for cough.  60 capsule  4  . Bepotastine Besilate (BEPREVE) 1.5 % SOLN Place 1 drop into both eyes 2 (two) times daily.      [name] buPROPion (WELLBUTRIN XL) 300 MG 24 hr tablet Take 300 mg by mouth daily.      . carvedilol (COREG) 6.25 MG tablet Take 1 tablet (6.25 mg total) by mouth 2 (two) times daily with a meal.  60 tablet  3  . chlorpheniramine-HYDROcodone (TUSSIONEX) 10-8 MG/5ML LQCR Take 10 mLs by mouth every 12 (twelve) hours as needed. For cough.  240 mL  0  . cholecalciferol (VITAMIN D) 1000 UNITS tablet Take 1,000 Units by mouth 2 (two) times daily.      . ciclesonide (OMNARIS) 50 MCG/ACT nasal spray Place 1 spray into both nostrils daily.       [name] dexlansoprazole (DEXILANT) 60 MG capsule Take 1 capsule (60 mg total) by mouth daily.  30 capsule  6  . estradiol (ESTRACE) 2 MG tablet Take 2 mg by mouth daily.      . fenofibrate (TRICOR) 145 MG tablet Take 145 mg by mouth daily.        . furosemide (LASIX) 20 MG tablet Take 1.5 tablets (30 mg total) by mouth 2 (two) times daily.  60 tablet  0  . glyBURIDE  (DIABETA) 2.5 MG tablet Take 2.5 mg by mouth daily with breakfast.      . HYDROcodone-acetaminophen (NORCO/VICODIN) 5-325 MG per tablet Take 2 tablets by mouth every 4 (four) hours as needed for pain.  10 tablet  0  . insulin glargine (LANTUS SOLOSTAR) 100 UNIT/ML injection Inject 20 Units into the skin at bedtime.  1 pen  3  . LORazepam (ATIVAN) 1 MG tablet Take 1 mg by mouth 2 (two) times daily as needed for anxiety.      . metFORMIN (GLUCOPHAGE) 1000 MG tablet Take 0.5 tablets (500 mg total) by mouth 2 (two) times daily with a meal.  180 tablet  3  . Mometasone Furo-Formoterol Fum (DULERA) 200-5 MCG/ACT AERO Inhale 2 puffs into the lungs  2 (two) times daily.  8.8 g  1  . nitroGLYCERIN (NITROSTAT) 0.4 MG SL tablet Place 0.4 mg under the tongue every 5 (five) minutes as needed. For chest pain.      [name] omeprazole (PRILOSEC) 20 MG capsule Take 20 mg by mouth at bedtime.      . potassium chloride SA (K-DUR,KLOR-CON) 20 MEQ tablet Take 20 mEq by mouth 3 (three) times daily.      . predniSONE (DELTASONE) 5 MG tablet Take 12.5 mg by mouth daily.      [name] venlafaxine XR (EFFEXOR-XR) 75 MG 24 hr capsule Take 75 mg by mouth 2 (two) times daily.      . valsartan (DIOVAN) 160 MG tablet Take 1 tablet (160 mg total) by mouth daily.  30 tablet  2   No current facility-administered medications for this visit.    History   Social History  . Marital Status: Married    Spouse Name: DARCIA LAMPI    Number of Children: 2  . Years of Education: 12   Occupational History  . DISABLED    Social History Main Topics  . Smoking status: Never Smoker   . Smokeless tobacco: Never Used  . Alcohol Use: No  . Drug Use: No  . Sexually Active: Yes   Other Topics Concern  . Not on file   Social History Narrative  . No narrative on file   ROS: A comprehensive Review of Systems - Negative except Pertinent positives noted above. The erythema in the legs is gotten better, but the edema has returned.  PHYSICAL  EXAM BP 126/70  Pulse 88  Ht 5\' 6"  (1.676 m)  Wt 268 lb 3.2 oz (121.655 kg)  BMI 43.31 kg/m2 General appearance: alert, cooperative, appears stated age, no distress and morbidly obese Neck: no carotid bruit, supple, symmetrical, trachea midline, thyroid not enlarged, symmetric, no tenderness/mass/nodules and Unable to really assess JVD due to her body habitus was obese neck. Lungs: clear to auscultation bilaterally, normal percussion bilaterally and Nonlabored Heart: regular rate and rhythm, S1, S2 normal, no murmur, click, rub or gallop and Unable to palpate apical impulse Abdomen: soft, non-tender; bowel sounds normal; no masses,  no organomegaly and Significant truncal obesity, unable to palpate HSM or determine HJR Extremities: edema 1-2+ bilaterally, varicose veins noted and No erythema or rash. Small healing stasis ulcers Pulses: 2+ and symmetric  [id number] today: Yes Rate: 88  , Rhythm:  normal sinus ;  otherwise normal ECG   ASSESSMENT: morbidly obese woman with pulmonary sarcoidosis. She has multiple reasons for having chest discomfort however with having exertional chest heaviness with dyspnea this could be either due to recurrence of the acute on chronic diastolic failure or from of elevated pulmonary pressures. She certainly is 8 pounds back up from her new dry weight after discharge.   Patient Active Problem List   Diagnosis Date Noted  . Solitary pulmonary nodule, on CT 02/2013 - recs to repeat imaging in 6-12 months 02/20/2013  . Acute on chronic diastolic congestive heart failure 02/11/2013  . Chronic cough 12/30/2011  . GOUT 08/20/2010  . OBESITY 06/04/2009  . SLEEP APNEA 04/21/2009  . Sarcoidosis of lung 04/10/2007  . HYPERLIPIDEMIA 08/21/2006  . HYPERTENSION 08/21/2006  . GERD 08/21/2006  . Type II diabetes mellitus with neurological manifestations, uncontrolled 08/21/2006    PLAN: Per problem list. Orders Placed This Encounter  Procedures  . Myocardial  Perfusion Imaging    Standing Status: Future     Number  of Occurrences:      Standing Expiration Date: 03/06/2014    Order Specific Question:  Where should this test be performed    Answer:  [facility]    Order Specific Question:  Type of stress    Answer:  Lexiscan    Order Specific Question:  Patient weight in lbs    Answer:  268  . EKG 12-Lead   Followup:  1 month after stress test and diuresis   HARDING,DAVID W, M.D., M.S. [facility] [address] [place], [place]  [address]  [phone] Pager # [phone] 03/06/2013 2:04 PM

## 2013-03-07 ENCOUNTER — Encounter: Payer: Self-pay | Admitting: Cardiology

## 2013-03-07 ENCOUNTER — Other Ambulatory Visit: Payer: Self-pay | Admitting: Internal Medicine

## 2013-03-07 NOTE — Assessment & Plan Note (Signed)
Unfortunately, the echocardiogram was not adequate for assessing pulmonary pressures. Depending on how her nuclear stress test pans out, and possible resolution of her symptoms, she may benefit from right heart catheterization to better determine pulmonary pressures. Some of her symptoms may be related to pulmonary hypertension from her sarcoidosis. I would defer this to her pulmonologist after we had adequately diuresis her.

## 2013-03-07 NOTE — Assessment & Plan Note (Signed)
Currently on TriCor. Not on statin. I believe is being followed by her primary physician.

## 2013-03-07 NOTE — Assessment & Plan Note (Signed)
In addition to her sarcoidosis she also has a problem of obesity and some of her shortness of breath and he said to be because of that as well as obesity hypoventilation syndrome. I would defer evaluation for OSA to Dr. Delford Field

## 2013-03-08 NOTE — Telephone Encounter (Signed)
Refills sent to pharmacy. 

## 2013-03-09 ENCOUNTER — Ambulatory Visit (HOSPITAL_COMMUNITY)
Admission: RE | Admit: 2013-03-09 | Discharge: 2013-03-09 | Disposition: A | Discharge status: Home / Self Care | Payer: BC Managed Care – PPO | Source: Ambulatory Visit | Attending: Cardiovascular Disease | Admitting: Cardiovascular Disease

## 2013-03-09 DIAGNOSIS — R0609 Other forms of dyspnea: Secondary | ICD-10-CM | POA: Insufficient documentation

## 2013-03-09 DIAGNOSIS — I5033 Acute on chronic diastolic (congestive) heart failure: Secondary | ICD-10-CM | POA: Diagnosis not present

## 2013-03-09 DIAGNOSIS — E669 Obesity, unspecified: Secondary | ICD-10-CM | POA: Insufficient documentation

## 2013-03-09 DIAGNOSIS — R5383 Other fatigue: Secondary | ICD-10-CM | POA: Insufficient documentation

## 2013-03-09 DIAGNOSIS — E119 Type 2 diabetes mellitus without complications: Secondary | ICD-10-CM | POA: Insufficient documentation

## 2013-03-09 DIAGNOSIS — R079 Chest pain, unspecified: Secondary | ICD-10-CM | POA: Insufficient documentation

## 2013-03-09 DIAGNOSIS — R0602 Shortness of breath: Secondary | ICD-10-CM | POA: Insufficient documentation

## 2013-03-09 DIAGNOSIS — R42 Dizziness and giddiness: Secondary | ICD-10-CM | POA: Insufficient documentation

## 2013-03-09 DIAGNOSIS — I10 Essential (primary) hypertension: Secondary | ICD-10-CM | POA: Insufficient documentation

## 2013-03-09 DIAGNOSIS — R0989 Other specified symptoms and signs involving the circulatory and respiratory systems: Secondary | ICD-10-CM | POA: Insufficient documentation

## 2013-03-09 DIAGNOSIS — R5381 Other malaise: Secondary | ICD-10-CM | POA: Insufficient documentation

## 2013-03-09 HISTORY — PX: NM MYOVIEW LTD: HXRAD82

## 2013-03-09 LAB — BRAIN NATRIURETIC PEPTIDE: Pro B Natriuretic peptide (BNP): 96 pg/mL (ref 0.0–100.0)

## 2013-03-09 MED ORDER — AMINOPHYLLINE 25 MG/ML IV SOLN
75.0000 mg | Freq: Once | INTRAVENOUS | Status: AC
  Administered 2013-03-09: 75 mg via INTRAVENOUS

## 2013-03-09 MED ORDER — REGADENOSON 0.4 MG/5ML IV SOLN
0.4000 mg | Freq: Once | INTRAVENOUS | Status: AC
  Administered 2013-03-09: 0.4 mg via INTRAVENOUS

## 2013-03-09 MED ORDER — TECHNETIUM TC 99M SESTAMIBI GENERIC - CARDIOLITE
10.9000 | Freq: Once | INTRAVENOUS | Status: AC | PRN
  Administered 2013-03-09: 10.9 via INTRAVENOUS

## 2013-03-09 MED ORDER — TECHNETIUM TC 99M SESTAMIBI GENERIC - CARDIOLITE
30.3000 | Freq: Once | INTRAVENOUS | Status: AC | PRN
  Administered 2013-03-09: 30.3 via INTRAVENOUS

## 2013-03-09 NOTE — Procedures (Addendum)
[facility] [address] [place] [address] [place] [address] [phone]  Cardiology Nuclear Med Study  KEASHIA HASKINS is a 52 y.o. female     MRN : [id number]     DOB: [birth year]  Procedure Date: 03/09/2013  Nuclear Med Background Indication for Stress Test:  Evaluation for Ischemia and Post Hospital History:  PRIOR CATH X2 --2000 AND 2012;SARCOIDOSIS;CHF Cardiac Risk Factors: Family History - CAD, Hypertension, IDDM Type 2, Lipids and Obesity  Symptoms:  Chest Pain, DOE, Fatigue, Light-Headedness and SOB   Nuclear Pre-Procedure Caffeine/Decaff Intake:  1:00am NPO After: 11 AM   IV Site: R Forearm  IV 0.9% NS with Angio Cath:  22g  Chest Size (in):  N/A IV Started by: Emmit Pomfret, RN  Height: 5\' 6"  (1.676 m)  Cup Size: DD  BMI:  Body mass index is 43.28 kg/(m^2). Weight:  268 lb (121.564 kg)   Tech Comments:  N/A    Nuclear Med Study 1 or 2 day study: 1 day  Stress Test Type:  Lexiscan  Order Authorizing Provider:  Bryan Lemma, MD   Resting Radionuclide: Technetium 34m Sestamibi  Resting Radionuclide Dose: 10.9 mCi   Stress Radionuclide:  Technetium 54m Sestamibi  Stress Radionuclide Dose: 30.3 mCi           Stress Protocol Rest HR: 83 Stress HR:100  Rest BP: 135/83 Stress BP: 152/76  Exercise Time (min): n/a METS: n/a          Dose of Adenosine (mg):  n/a Dose of Lexiscan: 0.4 mg  Dose of Atropine (mg): n/a Dose of Dobutamine: n/a mcg/kg/min (at max HR)  Stress Test Technologist: Ernestene Mention, CCT Nuclear Technologist: Gonzella Lex, CNMT   Rest Procedure:  Myocardial perfusion imaging was performed at rest 45 minutes following the intravenous administration of Technetium 2m Sestamibi. Stress Procedure:  The patient received IV Lexiscan 0.4 mg over 15-seconds.  Technetium 30m Sestamibi injected at 30-seconds.  Due to patient's shortness of breath, feeling flushed and stomach pains, she was given IV Aminophylline  75 mg. Symptoms were resolved during recovery. There were no significant changes with Lexiscan.  Quantitative spect images were obtained after a 45 minute delay.  Transient Ischemic Dilatation (Normal <1.22):  0.94 Lung/Heart Ratio (Normal <0.45):  0.26 QGS EDV:  137 ml QGS ESV:  69 ml LV Ejection Fraction: 50%  Signed by  Gonzella Lex, CNMT  PHYSICIAN INTERPRETATION  Rest ECG: NSR - Normal EKG and NSR with non-specific ST-T wave changes  Stress ECG: No significant change from baseline ECG  QPS Raw Data Images:  There is a breast shadow that accounts for the anterior attenuation.  There is significant subdiaphragmatic tracer uptake, the the hear sitting on the diaphragm leading to increased inferior intensity, that amplifies the anterior attenuation. Stress Images:  There is decreased uptake in the anterior wall. Rest Images:  There is decreased uptake in the anterior wall.  Comparison with the stress images reveals no significant change. Subtraction (SDS):  There is a fixed anteriour defect that is most consistent with breast attenuation. No reversibility is appreciated.There is no evidence of scar or ischemia.  Impression Exercise Capacity:  Lexiscan with no exercise. BP Response:  Normal blood pressure response. Clinical Symptoms:  There is dyspnea. Nausea and abdominal discomfort ECG Impression:  No significant ECG changes with Lexiscan. Comparison with Prior Nuclear Study: No images to compare  Overall Impression:  Low risk stress nuclear study with extensive breast attenaution,but no evidence  of ischemia or infarction..  LV Wall Motion: Low NL LV Function; NL Wall Motion   HARDING,DAVID W, MD  03/09/2013 6:00 PM

## 2013-03-12 ENCOUNTER — Encounter: Payer: Self-pay | Admitting: Internal Medicine

## 2013-03-12 ENCOUNTER — Ambulatory Visit (INDEPENDENT_AMBULATORY_CARE_PROVIDER_SITE_OTHER): Payer: BC Managed Care – PPO | Admitting: Internal Medicine

## 2013-03-12 VITALS — BP 134/80 | Temp 98.6°F | Wt 265.0 lb

## 2013-03-12 DIAGNOSIS — I5033 Acute on chronic diastolic (congestive) heart failure: Secondary | ICD-10-CM

## 2013-03-12 DIAGNOSIS — I10 Essential (primary) hypertension: Secondary | ICD-10-CM | POA: Diagnosis not present

## 2013-03-12 DIAGNOSIS — I509 Heart failure, unspecified: Secondary | ICD-10-CM

## 2013-03-12 MED ORDER — CARVEDILOL 12.5 MG PO TABS: 12.5000 mg | ORAL_TABLET | Freq: Two times a day (BID) | ORAL | Status: DC

## 2013-03-12 NOTE — Assessment & Plan Note (Signed)
Stress testing was negative for ischemia. It patient has persistent symptoms, right heart cath to be considered.

## 2013-03-12 NOTE — Assessment & Plan Note (Addendum)
BP is still suboptimal. Increase carvedilol to 12.5 mg twice a day. Her heart rate is still in high 80s to 90s. Continue same dose of losartan.  Reassess in 6 weeks.  BP: 134/80 mmHg  Pulse Readings from Last 3 Encounters:  03/06/13 88  02/26/13 100  02/13/13 98   Patient understands that a nonselective beta blocker may trigger wheezing or cough.

## 2013-03-12 NOTE — Progress Notes (Signed)
Subjective:    Patient ID: [name], female    DOB: May 18, 1961, 52 y.o.   MRN: [id number]  HPI  52 year old white female with uncontrolled type 2 diabetes, sarcoidosis and acute on chronic diastolic heart failure for followup. Interval medical history-she completed cardiac stress testing. It was negative for ischemia. Her cardiologist increased her furosemide.  Patient's blood pressure significantly improved since changing her beta blocker to carvedilol. Her cardiologist maintained patient on losartan. Patient monitors her blood pressure at home. Her pulse is still elevated.  SBP in the 140s.  She has history of sarcoidosis. She is intermittent history of wheezing. She reports slight cough within the last 2 days but no chest tightness or shortness of breath.  Review of Systems Mild tightness in her legs.  She has appt with endocrinologist    Past Medical History  Diagnosis Date  . DIABETES MELLITUS, TYPE II 08/21/2006  . HYPERLIPIDEMIA 08/21/2006  . GOUT 08/20/2010  . OBESITY 06/04/2009  . ANEMIA-UNSPECIFIED 09/18/2009  . HYPERTENSION 08/21/2006  . GERD 08/21/2006  . Shortness of breath   . SLEEP APNEA 04/21/2009    last testing was negative  . Internal hemorrhoids   . Acute on chronic diastolic congestive heart failure 02/11/2013  . Pulmonary sarcoidosis   . Exertional chest pain     sharp, substernal, exertional  . Vocal cord dysfunction     History   Social History  . Marital Status: Married    Spouse Name: ALAUNA HAYDEN    Number of Children: 2  . Years of Education: 12   Occupational History  . DISABLED    Social History Main Topics  . Smoking status: Never Smoker   . Smokeless tobacco: Never Used  . Alcohol Use: No  . Drug Use: No  . Sexually Active: Yes   Other Topics Concern  . Not on file   Social History Narrative  . No narrative on file    Past Surgical History  Procedure Laterality Date  . Ventral hernia repair    . Nissen  fundoplication  2004  . Cholecystectomy  1984  . Abdominal hysterectomy    . Knee arthroscopy      right  . Tubal ligation      with reversal in 1994  . Bladder suspension  11/11/2011    Procedure: TRANSVAGINAL TAPE (TVT) PROCEDURE;  Surgeon: Levi Aland, MD;  Location: [facility];  Service: Gynecology;  Laterality: N/A;  . Cystoscopy  11/11/2011    Procedure: CYSTOSCOPY;  Surgeon: Levi Aland, MD;  Location: [facility];  Service: Gynecology;  Laterality: N/A;  . Doppler echocardiography  09/11/11    LV FUNCTION, SIZE NORMAL; LV WALL THICKNESS WAS INCREASED IN A PATTERN OF MILD LVH; THERE WAS CONCENTRIC HYPERTROPHY. SYSTOLIC FUINCTION NORMAL; EST EF 55-65%; WALL MOTION NORMAL  . Myocardial perfusion  09/17/11    EF 69%; NO SIGNIFICANT WALL ABNORMALITIES NOTED; NORMAL MYOCARDIAL PERFUSION STUDY  . Cardiac catheterization  07/2010    LVEF 50-55% WITH VERY MILD GLOBAL HYPOKINESIA; ESSENTIALLY NORMAL CORONARY ARTERIES; NORMAL LV FUNCTION  . Carotids  02/18/11    CAROTID DUPLEX; VERTEBRALS ARE PATENT WITH ANTEGRADE FLOW. ICA/CCA RATIO 1.61 ON RIGHT AND 0.75 ON LEFT    Family History  Problem Relation Age of Onset  . Diabetes Father   . Heart attack Father   . Coronary artery disease Father   . Heart failure Father   . COPD Mother   . Emphysema Mother   . Asthma Mother   .  Heart failure Mother   . Sarcoidosis Maternal Uncle   . Colon cancer Neg Hx   . Lung cancer Brother   . Cancer Brother   . Diabetes Brother   . Heart attack Maternal Grandfather     Allergies  Allergen Reactions  . Clindamycin/Lincomycin     Nausea, rash  . Lisinopril Cough  . Methotrexate     REACTION: peri-oral and buccal lesions.    Current Outpatient Prescriptions on File Prior to Visit  Medication Sig Dispense Refill  . adalimumab (HUMIRA) 40 MG/0.8ML injection Inject 40 mg into the skin every 14 (fourteen) days.      [name] albuterol (PROVENTIL HFA;VENTOLIN HFA) 108 (90 BASE) MCG/ACT inhaler Inhale 2 puffs  into the lungs every 6 (six) hours as needed. For wheezing.      [name] albuterol (PROVENTIL) (5 MG/ML) 0.5% nebulizer solution Take 2.5 mg by nebulization every 6 (six) hours as needed. For wheezing.      [name] allopurinol (ZYLOPRIM) 100 MG tablet Take 100 mg by mouth daily.      [name] aspirin EC 81 MG tablet Take 81 mg by mouth daily.      [name] atorvastatin (LIPITOR) 40 MG tablet Take 40 mg by mouth at bedtime.      . benzonatate (TESSALON) 100 MG capsule Take 1 capsule (100 mg total) by mouth every 6 (six) hours as needed for cough.  60 capsule  4  . Bepotastine Besilate (BEPREVE) 1.5 % SOLN Place 1 drop into both eyes 2 (two) times daily.      [name] buPROPion (WELLBUTRIN XL) 300 MG 24 hr tablet Take 300 mg by mouth daily.      . chlorpheniramine-HYDROcodone (TUSSIONEX) 10-8 MG/5ML LQCR Take 10 mLs by mouth every 12 (twelve) hours as needed. For cough.  240 mL  0  . cholecalciferol (VITAMIN D) 1000 UNITS tablet Take 1,000 Units by mouth 2 (two) times daily.      . ciclesonide (OMNARIS) 50 MCG/ACT nasal spray Place 1 spray into both nostrils daily.       [name] dexlansoprazole (DEXILANT) 60 MG capsule Take 1 capsule (60 mg total) by mouth daily.  30 capsule  6  . estradiol (ESTRACE) 2 MG tablet Take 2 mg by mouth daily.      . fenofibrate (TRICOR) 145 MG tablet TAKE ONE TABLET BY MOUTH ONE TIME DAILY  30 tablet  6  . furosemide (LASIX) 40 MG tablet Take 1.5 tablets (60 mg total) by mouth 2 (two) times daily.  90 tablet  6  . glyBURIDE (DIABETA) 2.5 MG tablet Take 2.5 mg by mouth daily with breakfast.      . HYDROcodone-acetaminophen (NORCO/VICODIN) 5-325 MG per tablet Take 2 tablets by mouth every 4 (four) hours as needed for pain.  10 tablet  0  . insulin glargine (LANTUS SOLOSTAR) 100 UNIT/ML injection Inject 20 Units into the skin at bedtime.  1 pen  3  . LORazepam (ATIVAN) 1 MG tablet Take 1 mg by mouth 2 (two) times daily as needed for anxiety.      [name] losartan (COZAAR) 100 MG tablet Take 1 tablet (100 mg total) by  mouth daily.  90 tablet  3  . metFORMIN (GLUCOPHAGE) 1000 MG tablet Take 0.5 tablets (500 mg total) by mouth 2 (two) times daily with a meal.  180 tablet  3  . Mometasone Furo-Formoterol Fum (DULERA) 200-5 MCG/ACT AERO Inhale 2 puffs into the lungs 2 (two) times daily.  8.8 g  1  .  nitroGLYCERIN (NITROSTAT) 0.4 MG SL tablet Place 0.4 mg under the tongue every 5 (five) minutes as needed. For chest pain.      [name] omeprazole (PRILOSEC) 20 MG capsule Take 20 mg by mouth at bedtime.      . potassium chloride SA (K-DUR,KLOR-CON) 20 MEQ tablet Take 20 mEq by mouth 3 (three) times daily.      . predniSONE (DELTASONE) 5 MG tablet Take 12.5 mg by mouth daily.      [name] venlafaxine XR (EFFEXOR-XR) 75 MG 24 hr capsule Take 75 mg by mouth 2 (two) times daily.       No current facility-administered medications on file prior to visit.    BP 134/80  Temp(Src) 98.6 F (37 C) (Oral)  Wt 265 lb (120.203 kg)  BMI 42.79 kg/m2    Objective:   Physical Exam  Constitutional: She appears well-developed and well-nourished.  Cardiovascular: Normal rate, regular rhythm and normal heart sounds.   Pulmonary/Chest: Effort normal. She has no wheezes.  Musculoskeletal:  +1 bilateral lower ext edema  Psychiatric: She has a normal mood and affect. Her behavior is normal.          Assessment & Plan:

## 2013-03-13 ENCOUNTER — Encounter: Payer: Self-pay | Admitting: Internal Medicine

## 2013-03-13 ENCOUNTER — Ambulatory Visit (INDEPENDENT_AMBULATORY_CARE_PROVIDER_SITE_OTHER): Payer: BC Managed Care – PPO | Admitting: Internal Medicine

## 2013-03-13 VITALS — BP 122/76 | HR 103 | Ht 66.0 in | Wt 264.0 lb

## 2013-03-13 DIAGNOSIS — R198 Other specified symptoms and signs involving the digestive system and abdomen: Secondary | ICD-10-CM

## 2013-03-13 DIAGNOSIS — K219 Gastro-esophageal reflux disease without esophagitis: Secondary | ICD-10-CM

## 2013-03-13 DIAGNOSIS — R110 Nausea: Secondary | ICD-10-CM | POA: Diagnosis not present

## 2013-03-13 MED ORDER — PROMETHAZINE HCL 25 MG PO TABS: 25.0000 mg | ORAL_TABLET | Freq: Four times a day (QID) | ORAL | Status: DC | PRN

## 2013-03-13 NOTE — Progress Notes (Signed)
HISTORY OF PRESENT ILLNESS:  [name] is a 52 y.o. female with multiple significant medical problems as listed below. She was last evaluated in this office 09/27/2012 with a chief complaint of esophageal burning and pill dysphagia despite PPI therapy. She subsequently underwent upper endoscopy with empiric dilation of the esophagus using a 54 French Maloney dilator. Endoscopy was normal post fundoplication. She was to continue with reflux precautions and PPI therapy. She presents today with chief complaint of nausea, since her recent hospitalization for cardia pulmonary issues. Also complains of diarrhea. Her last complete colonoscopy was 04/29/2010. This was normal except for mild melanosis and small internal hemorrhoids. Since her endoscopy, she states that her dysphagia has improved. She was hospitalized 02/11/2013 with congestive heart failure. Upon discharge there was increasing her Lasix dosage. Since discharge she has noticed nausea occurs often with meals. No vomiting. As well, change in bowel habits manifested by 4-5 soft bowel movements per day. No true diarrhea. No nocturnal component. No bleeding. She recently had a new blood pressure medicine, losartan added. She is accompanied by her husband. She tells me that she has taken Phenergan the past for nausea with good results.  REVIEW OF SYSTEMS:  All non-GI ROS negative except for shortness of breath, ankle edema, fatigue,  Past Medical History  Diagnosis Date  . DIABETES MELLITUS, TYPE II 08/21/2006  . HYPERLIPIDEMIA 08/21/2006  . GOUT 08/20/2010  . OBESITY 06/04/2009  . ANEMIA-UNSPECIFIED 09/18/2009  . HYPERTENSION 08/21/2006  . GERD 08/21/2006  . Shortness of breath   . SLEEP APNEA 04/21/2009    last testing was negative  . Internal hemorrhoids   . Acute on chronic diastolic congestive heart failure 02/11/2013  . Pulmonary sarcoidosis   . Exertional chest pain     sharp, substernal, exertional  . Vocal cord dysfunction   .  CHF (congestive heart failure)     Past Surgical History  Procedure Laterality Date  . Ventral hernia repair    . Nissen fundoplication  2004  . Cholecystectomy  1984  . Abdominal hysterectomy    . Knee arthroscopy      right  . Tubal ligation      with reversal in 1994  . Bladder suspension  11/11/2011    Procedure: TRANSVAGINAL TAPE (TVT) PROCEDURE;  Surgeon: Levi Aland, MD;  Location: [facility];  Service: Gynecology;  Laterality: N/A;  . Cystoscopy  11/11/2011    Procedure: CYSTOSCOPY;  Surgeon: Levi Aland, MD;  Location: [facility];  Service: Gynecology;  Laterality: N/A;  . Doppler echocardiography  09/11/11    LV FUNCTION, SIZE NORMAL; LV WALL THICKNESS WAS INCREASED IN A PATTERN OF MILD LVH; THERE WAS CONCENTRIC HYPERTROPHY. SYSTOLIC FUINCTION NORMAL; EST EF 55-65%; WALL MOTION NORMAL  . Myocardial perfusion  09/17/11    EF 69%; NO SIGNIFICANT WALL ABNORMALITIES NOTED; NORMAL MYOCARDIAL PERFUSION STUDY  . Cardiac catheterization  07/2010    LVEF 50-55% WITH VERY MILD GLOBAL HYPOKINESIA; ESSENTIALLY NORMAL CORONARY ARTERIES; NORMAL LV FUNCTION  . Carotids  02/18/11    CAROTID DUPLEX; VERTEBRALS ARE PATENT WITH ANTEGRADE FLOW. ICA/CCA RATIO 1.61 ON RIGHT AND 0.75 ON LEFT    Social History [name]  reports that she has never smoked. She has never used smokeless tobacco. She reports that she does not drink alcohol or use illicit drugs.  family history includes Asthma in her mother; COPD in her mother; Cancer in her brother; Coronary artery disease in her father; Diabetes in her brother and father;  Emphysema in her mother; Heart attack in her father and maternal grandfather; Heart failure in her father and mother; Lung cancer in her brother; and Sarcoidosis in her maternal uncle.  There is no history of Colon cancer.  Allergies  Allergen Reactions  . Clindamycin/Lincomycin     Nausea, rash  . Lisinopril Cough  . Methotrexate     REACTION: peri-oral and buccal lesions.        PHYSICAL EXAMINATION: Vital signs: BP 122/76  Pulse 103  Ht 5\' 6"  (1.676 m)  Wt 264 lb (119.75 kg)  BMI 42.63 kg/m2  SpO2 96% General: Obese, Well-developed, well-nourished, no acute distress HEENT: Sclerae are anicteric, conjunctiva pink. Oral mucosa intact Lungs: Clear Heart: Regular with slightly elevated rate Abdomen: soft, obese nontender, nondistended, no obvious ascites, no peritoneal signs, normal bowel sounds. No organomegaly. No succussion splash. Prior surgical incisions well-healed Extremities: Trace edema bilaterally Psychiatric: alert and oriented x3. Cooperative   ASSESSMENT:  #1. Nausea. Medical nausea related to chronic medical problems and polypharmacy. No reason to suspect primary GI disorder. On chronic PPI. Negative endoscopy earlier this year #2. GERD. Status post fundoplication. On PPI #3. Dysphagia. Improve after dilation #4. Change in bowel habits. Nonspecific. No worrisome features #5. Negative colonoscopy 2011 #6. Multiple medical problems    PLAN:  #1. Reflux precautions to be continued #2. Continue PPI #3. Prescribe Phenergan 25 mg by mouth every 6 hours when necessary nausea #4. Imodium as needed for increased frequency of bowel movements. Be careful. #5. GI followup as needed

## 2013-03-13 NOTE — Patient Instructions (Addendum)
We have sent the following medications to your pharmacy for you to pick up at your convenience:  Phenergan

## 2013-03-14 ENCOUNTER — Ambulatory Visit: Payer: BC Managed Care – PPO | Admitting: Internal Medicine

## 2013-03-15 ENCOUNTER — Ambulatory Visit: Payer: BC Managed Care – PPO | Admitting: Internal Medicine

## 2013-03-16 ENCOUNTER — Other Ambulatory Visit: Payer: Self-pay | Admitting: *Deleted

## 2013-03-16 ENCOUNTER — Ambulatory Visit: Payer: BC Managed Care – PPO | Admitting: Critical Care Medicine

## 2013-03-16 MED ORDER — HYDROCOD POLST-CHLORPHEN POLST 10-8 MG/5ML PO LQCR: 10.0000 mL | Freq: Two times a day (BID) | ORAL | Status: DC | PRN

## 2013-03-16 NOTE — Telephone Encounter (Signed)
Last OV with PW: 11/03/12; asked to f/u in 4 months. Pending OV with PW: 04/16/13 Tussionex rx last given on 11/03/12 with #240 mL x 0

## 2013-03-19 ENCOUNTER — Telehealth: Payer: Self-pay | Admitting: *Deleted

## 2013-03-19 NOTE — Telephone Encounter (Signed)
Message copied by Tobin Chad on Mon Mar 19, 2013  6:29 PM ------      Message from: [facility], DAVID      Created: Wed Mar 14, 2013  1:10 PM       This is good news.            Marykay Lex, MD      Can close encounter once pt called. ------

## 2013-03-19 NOTE — Telephone Encounter (Signed)
Left message to call back about stress test.

## 2013-03-21 ENCOUNTER — Telehealth: Payer: Self-pay | Admitting: *Deleted

## 2013-03-21 LAB — HM DIABETES FOOT EXAM

## 2013-03-21 NOTE — Telephone Encounter (Signed)
Message copied by Tobin Chad on Wed Mar 21, 2013  8:36 AM ------      Message from: [facility], DAVID      Created: Wed Mar 14, 2013  1:10 PM       This is good news.            Marykay Lex, MD      Can close encounter once pt called. ------

## 2013-03-21 NOTE — Telephone Encounter (Signed)
Pt called. Spoke to her result given.

## 2013-03-26 ENCOUNTER — Other Ambulatory Visit: Payer: Self-pay | Admitting: Internal Medicine

## 2013-03-29 ENCOUNTER — Ambulatory Visit (INDEPENDENT_AMBULATORY_CARE_PROVIDER_SITE_OTHER): Payer: BC Managed Care – PPO | Admitting: Internal Medicine

## 2013-03-29 ENCOUNTER — Encounter: Payer: Self-pay | Admitting: Internal Medicine

## 2013-03-29 VITALS — BP 140/80 | HR 102 | Temp 98.3°F | Resp 20 | Wt 264.0 lb

## 2013-03-29 DIAGNOSIS — I5033 Acute on chronic diastolic (congestive) heart failure: Secondary | ICD-10-CM

## 2013-03-29 DIAGNOSIS — D869 Sarcoidosis, unspecified: Secondary | ICD-10-CM

## 2013-03-29 DIAGNOSIS — J99 Respiratory disorders in diseases classified elsewhere: Secondary | ICD-10-CM

## 2013-03-29 DIAGNOSIS — I509 Heart failure, unspecified: Secondary | ICD-10-CM

## 2013-03-29 DIAGNOSIS — D860 Sarcoidosis of lung: Secondary | ICD-10-CM

## 2013-03-29 MED ORDER — HYDROCOD POLST-CHLORPHEN POLST 10-8 MG/5ML PO LQCR: 10.0000 mL | Freq: Two times a day (BID) | ORAL | Status: DC | PRN

## 2013-03-29 NOTE — Patient Instructions (Signed)
Acute bronchitis symptoms for less than 10 days are generally not helped by antibiotics.  Take over-the-counter expectorants and cough medications such as  Mucinex DM.  Call if there is no improvement in 5 to 7 days or if he developed worsening cough, fever, or new symptoms, such as shortness of breath or chest pain.    

## 2013-03-29 NOTE — Progress Notes (Signed)
Subjective:    Patient ID: [name], female    DOB: 1961-07-16, 52 y.o.   MRN: [id number]  HPI  52 year old patient who has a prior history of acute on chronic diastolic heart failure. She also has a history of pulmonary sarcoidosis. Medical regimen includes low-dose prednisone therapy Tessalon as well as Tussionex. She has been on albuterol.   for the past 2 days she has had increasing sore throat nonproductive cough and chest congestion. No fever or productive cough. No wheezing.   she did have a nuclear stress test performed on 03/09/2013 that was low risk. Ejection fraction was 50%. She status post heart catheterization in 2000 and 2012  Past Medical History  Diagnosis Date  . DIABETES MELLITUS, TYPE II 08/21/2006  . HYPERLIPIDEMIA 08/21/2006  . GOUT 08/20/2010  . OBESITY 06/04/2009  . ANEMIA-UNSPECIFIED 09/18/2009  . HYPERTENSION 08/21/2006  . GERD 08/21/2006  . Shortness of breath   . SLEEP APNEA 04/21/2009    last testing was negative  . Internal hemorrhoids   . Acute on chronic diastolic congestive heart failure 02/11/2013  . Pulmonary sarcoidosis   . Exertional chest pain     sharp, substernal, exertional  . Vocal cord dysfunction   . CHF (congestive heart failure)     History   Social History  . Marital Status: Married    Spouse Name: MAEOLA MCHANEY    Number of Children: 2  . Years of Education: 12   Occupational History  . DISABLED    Social History Main Topics  . Smoking status: Never Smoker   . Smokeless tobacco: Never Used  . Alcohol Use: No  . Drug Use: No  . Sexually Active: Yes   Other Topics Concern  . Not on file   Social History Narrative  . No narrative on file    Past Surgical History  Procedure Laterality Date  . Ventral hernia repair    . Nissen fundoplication  2004  . Cholecystectomy  1984  . Abdominal hysterectomy    . Knee arthroscopy      right  . Tubal ligation      with reversal in 1994  . Bladder suspension   11/11/2011    Procedure: TRANSVAGINAL TAPE (TVT) PROCEDURE;  Surgeon: Levi Aland, MD;  Location: [facility];  Service: Gynecology;  Laterality: N/A;  . Cystoscopy  11/11/2011    Procedure: CYSTOSCOPY;  Surgeon: Levi Aland, MD;  Location: [facility];  Service: Gynecology;  Laterality: N/A;  . Doppler echocardiography  09/11/11    LV FUNCTION, SIZE NORMAL; LV WALL THICKNESS WAS INCREASED IN A PATTERN OF MILD LVH; THERE WAS CONCENTRIC HYPERTROPHY. SYSTOLIC FUINCTION NORMAL; EST EF 55-65%; WALL MOTION NORMAL  . Myocardial perfusion  09/17/11    EF 69%; NO SIGNIFICANT WALL ABNORMALITIES NOTED; NORMAL MYOCARDIAL PERFUSION STUDY  . Cardiac catheterization  07/2010    LVEF 50-55% WITH VERY MILD GLOBAL HYPOKINESIA; ESSENTIALLY NORMAL CORONARY ARTERIES; NORMAL LV FUNCTION  . Carotids  02/18/11    CAROTID DUPLEX; VERTEBRALS ARE PATENT WITH ANTEGRADE FLOW. ICA/CCA RATIO 1.61 ON RIGHT AND 0.75 ON LEFT    Family History  Problem Relation Age of Onset  . Diabetes Father   . Heart attack Father   . Coronary artery disease Father   . Heart failure Father   . COPD Mother   . Emphysema Mother   . Asthma Mother   . Heart failure Mother   . Sarcoidosis Maternal Uncle   . Colon cancer Neg Hx   .  Lung cancer Brother   . Cancer Brother   . Diabetes Brother   . Heart attack Maternal Grandfather     Allergies  Allergen Reactions  . Clindamycin/Lincomycin     Nausea, rash  . Lisinopril Cough  . Methotrexate     REACTION: peri-oral and buccal lesions.    Current Outpatient Prescriptions on File Prior to Visit  Medication Sig Dispense Refill  . adalimumab (HUMIRA) 40 MG/0.8ML injection Inject 40 mg into the skin every 14 (fourteen) days.      [name] albuterol (PROVENTIL HFA;VENTOLIN HFA) 108 (90 BASE) MCG/ACT inhaler Inhale 2 puffs into the lungs every 6 (six) hours as needed. For wheezing.      [name] albuterol (PROVENTIL) (5 MG/ML) 0.5% nebulizer solution Take 2.5 mg by nebulization every 6 (six) hours as  needed. For wheezing.      [name] allopurinol (ZYLOPRIM) 100 MG tablet Take 100 mg by mouth daily.      [name] aspirin EC 81 MG tablet Take 81 mg by mouth daily.      [name] atorvastatin (LIPITOR) 40 MG tablet Take 40 mg by mouth at bedtime.      . benzonatate (TESSALON) 100 MG capsule Take 1 capsule (100 mg total) by mouth every 6 (six) hours as needed for cough.  60 capsule  4  . buPROPion (WELLBUTRIN XL) 300 MG 24 hr tablet Take 300 mg by mouth daily.      . carvedilol (COREG) 12.5 MG tablet Take 1 tablet (12.5 mg total) by mouth 2 (two) times daily with a meal.  60 tablet  3  . chlorpheniramine-HYDROcodone (TUSSIONEX) 10-8 MG/5ML LQCR Take 10 mLs by mouth every 12 (twelve) hours as needed. For cough.  240 mL  0  . cholecalciferol (VITAMIN D) 1000 UNITS tablet Take 1,000 Units by mouth 2 (two) times daily.      . ciclesonide (OMNARIS) 50 MCG/ACT nasal spray Place 1 spray into both nostrils daily.       [name] dexlansoprazole (DEXILANT) 60 MG capsule Take 1 capsule (60 mg total) by mouth daily.  30 capsule  6  . estradiol (ESTRACE) 2 MG tablet Take 2 mg by mouth daily.      . fenofibrate (TRICOR) 145 MG tablet TAKE ONE TABLET BY MOUTH ONE TIME DAILY  30 tablet  6  . furosemide (LASIX) 40 MG tablet Take 1.5 tablets (60 mg total) by mouth 2 (two) times daily.  90 tablet  6  . glyBURIDE (DIABETA) 2.5 MG tablet Take 2.5 mg by mouth daily with breakfast.      . HYDROcodone-acetaminophen (NORCO/VICODIN) 5-325 MG per tablet Take 2 tablets by mouth every 4 (four) hours as needed for pain.  10 tablet  0  . insulin glargine (LANTUS SOLOSTAR) 100 UNIT/ML injection Inject 20 Units into the skin at bedtime.  1 pen  3  . LORazepam (ATIVAN) 1 MG tablet Take 1 mg by mouth 2 (two) times daily as needed for anxiety.      [name] losartan (COZAAR) 100 MG tablet Take 1 tablet (100 mg total) by mouth daily.  90 tablet  3  . metFORMIN (GLUCOPHAGE) 1000 MG tablet TAKE ONE TABLET BY MOUTH TWICE DAILY WITH A MEAL.  180 tablet  0  . Mometasone  Furo-Formoterol Fum (DULERA) 200-5 MCG/ACT AERO Inhale 2 puffs into the lungs 2 (two) times daily.  8.8 g  1  . nitroGLYCERIN (NITROSTAT) 0.4 MG SL tablet Place 0.4 mg under the tongue every 5 (five) minutes as needed.  For chest pain.      [name] omeprazole (PRILOSEC) 20 MG capsule Take 20 mg by mouth at bedtime.      . potassium chloride SA (K-DUR,KLOR-CON) 20 MEQ tablet Take 20 mEq by mouth 3 (three) times daily.      . predniSONE (DELTASONE) 5 MG tablet Take 12.5 mg by mouth daily.      . promethazine (PHENERGAN) 25 MG tablet Take 1 tablet (25 mg total) by mouth every 6 (six) hours as needed for nausea.  60 tablet  3  . venlafaxine XR (EFFEXOR-XR) 75 MG 24 hr capsule Take 75 mg by mouth 2 (two) times daily.       No current facility-administered medications on file prior to visit.    BP 140/80  Pulse 102  Temp(Src) 98.3 F (36.8 C) (Oral)  Resp 20  Wt 264 lb (119.75 kg)  BMI 42.63 kg/m2  SpO2 97%       Review of Systems  Constitutional: Negative.   HENT: Positive for congestion and sore throat. Negative for hearing loss, rhinorrhea, dental problem, sinus pressure and tinnitus.   Eyes: Negative for pain, discharge and visual disturbance.  Respiratory: Positive for cough. Negative for shortness of breath.   Cardiovascular: Negative for chest pain, palpitations and leg swelling.  Gastrointestinal: Negative for nausea, vomiting, abdominal pain, diarrhea, constipation, blood in stool and abdominal distention.  Genitourinary: Negative for dysuria, urgency, frequency, hematuria, flank pain, vaginal bleeding, vaginal discharge, difficulty urinating, vaginal pain and pelvic pain.  Musculoskeletal: Negative for joint swelling, arthralgias and gait problem.  Skin: Negative for rash.  Neurological: Negative for dizziness, syncope, speech difficulty, weakness, numbness and headaches.  Hematological: Negative for adenopathy.  Psychiatric/Behavioral: Negative for behavioral problems, dysphoric  mood and agitation. The patient is not nervous/anxious.        Objective:   Physical Exam  Constitutional: She is oriented to person, place, and time. She appears well-developed and well-nourished. No distress.  Repeat blood pressure 120/78   HENT:  Head: Normocephalic.  Right Ear: External ear normal.  Left Ear: External ear normal.  Mouth/Throat: Oropharynx is clear and moist.  Eyes: Conjunctivae and EOM are normal. Pupils are equal, round, and reactive to light.  Neck: Normal range of motion. Neck supple. No JVD present. No thyromegaly present.  Cardiovascular: Normal rate, regular rhythm, normal heart sounds and intact distal pulses.   Pulmonary/Chest: Effort normal and breath sounds normal. No respiratory distress. She has no wheezes. She has no rales.  O2 saturation 97  Abdominal: Soft. Bowel sounds are normal. She exhibits no mass. There is no tenderness.  Musculoskeletal: Normal range of motion. She exhibits no edema.  Lymphadenopathy:    She has no cervical adenopathy.  Neurological: She is alert and oriented to person, place, and time.  Skin: Skin is warm and dry. No rash noted.  Psychiatric: She has a normal mood and affect. Her behavior is normal.          Assessment & Plan:   Viral URI with cough. We'll continue symptomatic treatment History of pulmonary sarcoidosis Diabetes History diastolic heart failure

## 2013-04-03 ENCOUNTER — Encounter: Payer: Self-pay | Admitting: Cardiology

## 2013-04-03 ENCOUNTER — Ambulatory Visit (INDEPENDENT_AMBULATORY_CARE_PROVIDER_SITE_OTHER): Payer: BC Managed Care – PPO | Admitting: Internal Medicine

## 2013-04-03 ENCOUNTER — Encounter: Payer: Self-pay | Admitting: Internal Medicine

## 2013-04-03 ENCOUNTER — Ambulatory Visit (INDEPENDENT_AMBULATORY_CARE_PROVIDER_SITE_OTHER): Payer: BC Managed Care – PPO | Admitting: Cardiology

## 2013-04-03 VITALS — BP 140/86 | HR 84 | Temp 98.0°F | Wt 261.0 lb

## 2013-04-03 VITALS — BP 100/60 | HR 96 | Ht 66.0 in | Wt 262.9 lb

## 2013-04-03 DIAGNOSIS — R079 Chest pain, unspecified: Secondary | ICD-10-CM | POA: Diagnosis not present

## 2013-04-03 DIAGNOSIS — E1142 Type 2 diabetes mellitus with diabetic polyneuropathy: Secondary | ICD-10-CM

## 2013-04-03 DIAGNOSIS — G4730 Sleep apnea, unspecified: Secondary | ICD-10-CM | POA: Diagnosis not present

## 2013-04-03 DIAGNOSIS — E1165 Type 2 diabetes mellitus with hyperglycemia: Secondary | ICD-10-CM

## 2013-04-03 DIAGNOSIS — I509 Heart failure, unspecified: Secondary | ICD-10-CM | POA: Diagnosis not present

## 2013-04-03 DIAGNOSIS — E1149 Type 2 diabetes mellitus with other diabetic neurological complication: Secondary | ICD-10-CM

## 2013-04-03 DIAGNOSIS — I5033 Acute on chronic diastolic (congestive) heart failure: Secondary | ICD-10-CM | POA: Diagnosis not present

## 2013-04-03 DIAGNOSIS — R911 Solitary pulmonary nodule: Secondary | ICD-10-CM | POA: Diagnosis not present

## 2013-04-03 DIAGNOSIS — I10 Essential (primary) hypertension: Secondary | ICD-10-CM | POA: Diagnosis not present

## 2013-04-03 DIAGNOSIS — I5032 Chronic diastolic (congestive) heart failure: Secondary | ICD-10-CM

## 2013-04-03 DIAGNOSIS — IMO0002 Reserved for concepts with insufficient information to code with codable children: Secondary | ICD-10-CM

## 2013-04-03 DIAGNOSIS — E669 Obesity, unspecified: Secondary | ICD-10-CM

## 2013-04-03 NOTE — Assessment & Plan Note (Signed)
Clinically doing well Has f/u with CV regularly

## 2013-04-03 NOTE — Patient Instructions (Addendum)
Dr. Herbie Baltimore asks to continue your sliding scale lasix.  He is happy with your weight, blood pressure, and does not believe the stress test/chest pain is cardiac in nature. If your pulmonologist would like you to have a cardiac catheterization, Dr. Herbie Baltimore will be happy to do it.   Your physician wants you to follow-up in: 6 months. You will receive a reminder letter in the mail two months in advance. If you don't receive a letter, please call our office to schedule the follow-up appointment.

## 2013-04-03 NOTE — Assessment & Plan Note (Signed)
Fu in november

## 2013-04-05 IMAGING — CR DG NECK SOFT TISSUE
1 series · 1 of 1 positions shown · non-contrast
Comparison: None.

CLINICAL DATA: Short of breath.  Wheezing.

NECK SOFT TISSUES - 1+ VIEW

[w soft tissue neck]
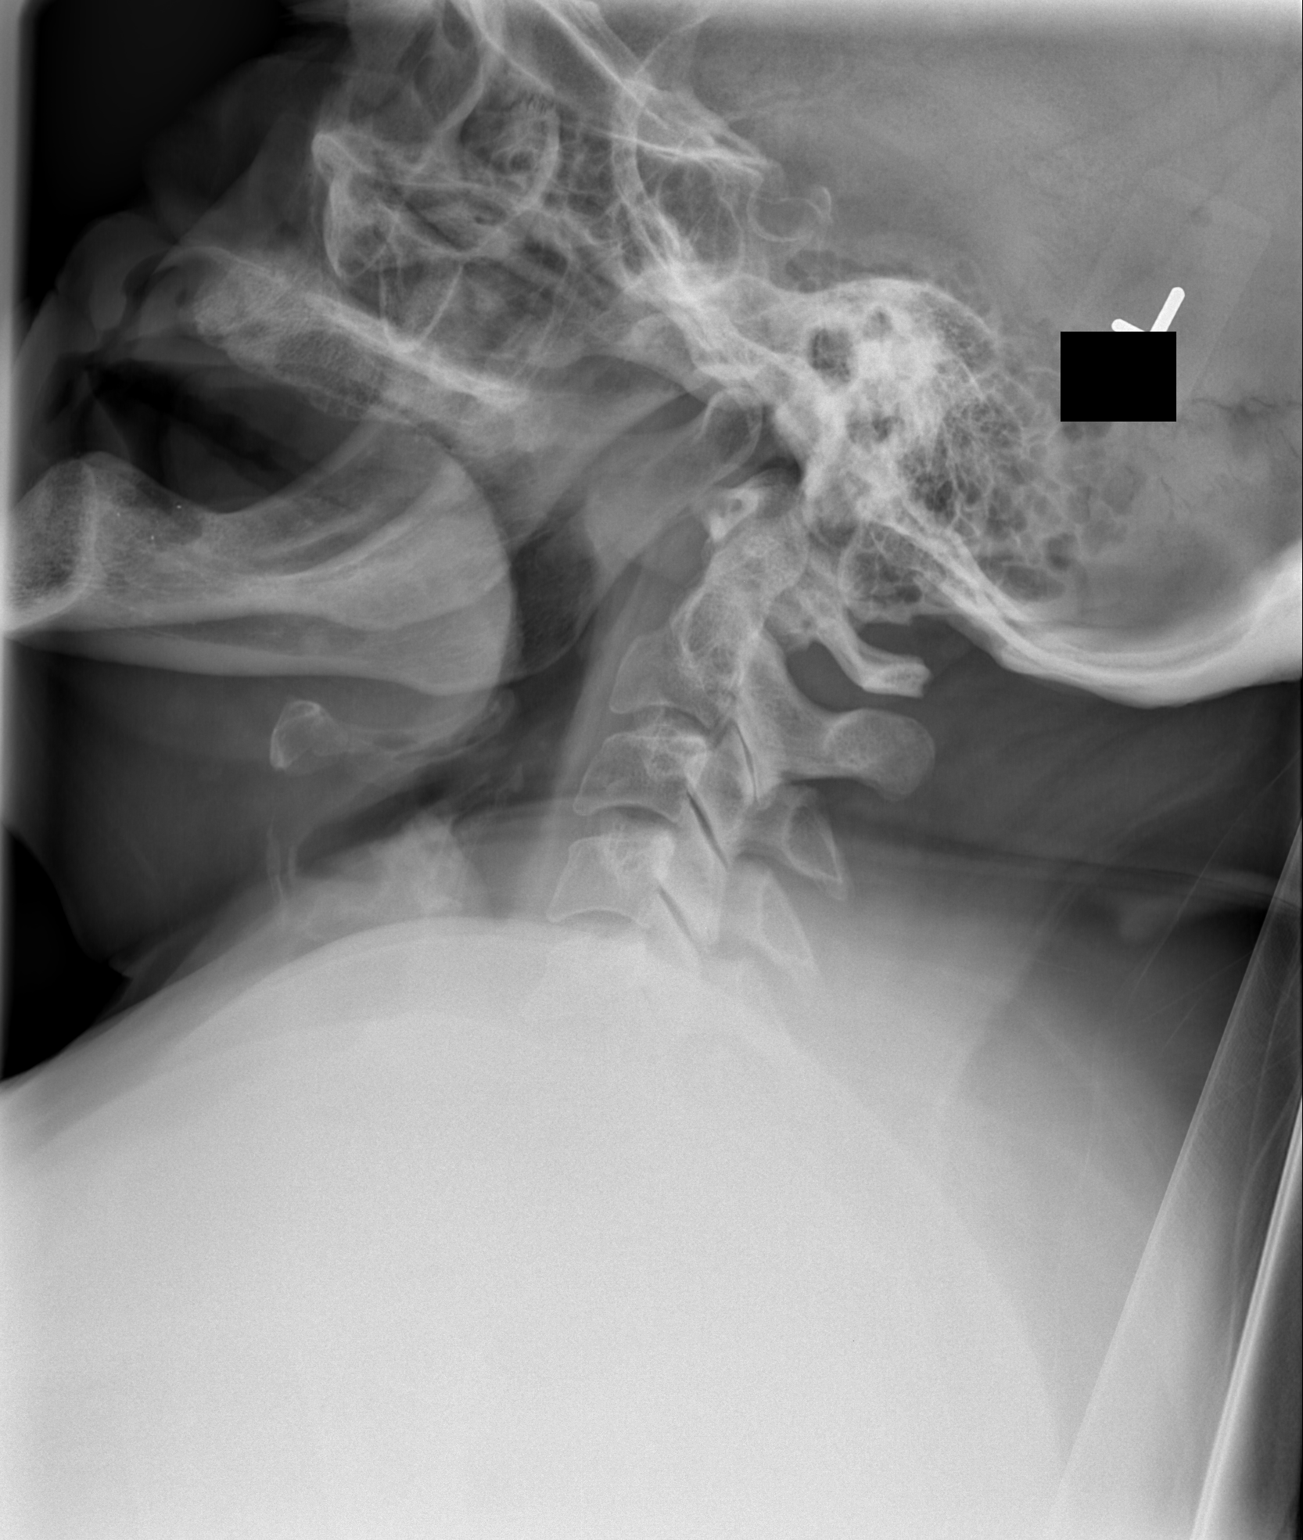

[1 of 1 positions shown; findings below may reference images not displayed]

FINDINGS: Epiglottis and aryepiglottic folds grossly appear within
normal limits.  Visualized prevertebral soft tissues normal.  No
radiopaque foreign body.  The lower cervical soft tissues are
obscured by artifact and overlapping soft tissue from the
shoulders.  The patient is edentulous.  Hypopharynx appears within
normal limits.
IMPRESSION: No acute abnormality.  Normal appearance of the epiglottis.  Poor
visualization of the lower soft tissues of the neck.

## 2013-04-05 NOTE — Assessment & Plan Note (Signed)
Lab Results  Component Value Date   HGBA1C 7.2* 03/06/2013   Will continue to follow- stay on same meds

## 2013-04-05 NOTE — Progress Notes (Signed)
Patient ID: NETTA FODGE, female   DOB: 11-26-1960, 52 y.o.   MRN: [id number] Pt here primarily for f/u dm Home cbgs in the 150-200 range No sxs related to DM Still on prednisone for SARCOID  Reviewed cv notes and echo for diastolic heart failure  Reviewed meds, pmh, psh  Ros-- fatigue and chronic cough No other complaints  Exam:  Well-developed well-nourished female in no acute distress. HEENT exam atraumatic, normocephalic, extraocular muscles are intact. Neck is supple. No jugular venous distention no thyromegaly. Chest clear to auscultation without increased work of breathing. Cardiac exam S1 and S2 are regular. Abdominal exam active bowel sounds, soft, nontender. Extremities no edema. Neurologic exam she is alert without any motor sensory deficits. Gait is normal.

## 2013-04-08 ENCOUNTER — Encounter: Payer: Self-pay | Admitting: Cardiology

## 2013-04-08 DIAGNOSIS — I5032 Chronic diastolic (congestive) heart failure: Secondary | ICD-10-CM | POA: Insufficient documentation

## 2013-04-08 DIAGNOSIS — I110 Hypertensive heart disease with heart failure: Secondary | ICD-10-CM | POA: Insufficient documentation

## 2013-04-08 NOTE — Progress Notes (Signed)
Patient ID: [name], female   DOB: [birth year], 52 y.o.   MRN: [id number]  Clinic Note: HPI: [name] is a 53 y.o. female with a PMH below who presents today for followup her echocardiogram and Myoview for chest pain and edema evaluation. I saw her on May 27 issues noticed a sensation of heaviness in her chest that comes and goes most of which was at night this is concerning for possible orthopnea related angina. This visit was in followup from a hospital admission for acute or chronic diastolic heart failure.  Her symptoms are evaluated with a LexiScan Myoview which is noted below. A transthoracic echocardiogram during hospitalization which was not helpful for his adequate evaluation of wall motion, the function and diastolic dysfunction.  Interval History: And she returns today for followup. She has been adequately diuresed and down to what seems to be her dry weight. She still has orthopnea, and that is basally her baseline. Her edema seems improved. She's lost 6 more pounds from her last visit. She still has some symptoms of that were similar to PND but not frequently. She's not had much in the way of any chest discomfort recently. This is it with either rest or exertion. She doesn't really do much activity. When she does have her chest discomfort it's intermittent heaviness again which lives at night.  She denies any operations or irregular heartbeats, no significant near syncope. No TIA or amaurosis fugax symptoms. No significant change to her chronic coughing, but denies significant wheezing either. She has baseline fatigue.   Past Medical History  Diagnosis Date  . DIABETES MELLITUS, TYPE II 08/21/2006  . HYPERLIPIDEMIA 08/21/2006  . GOUT 08/20/2010  . OBESITY 06/04/2009  . ANEMIA-UNSPECIFIED 09/18/2009  . HYPERTENSION 08/21/2006  . GERD 08/21/2006  . SLEEP APNEA 04/21/2009    last testing was negative  . Internal hemorrhoids   . Pulmonary sarcoidosis     Followed  locally by pulmonology, but also by Dr. Sandy Salaam at [facility]  . Exertional chest pain     sharp, substernal, exertional  . Vocal cord dysfunction   . Chronic diastolic heart failure, NYHA class 2    Prior Cardiac Evaluation and Past Surgical History: Past Surgical History  Procedure Laterality Date  . Ventral hernia repair    . Nissen fundoplication  2004  . Cholecystectomy  1984  . Abdominal hysterectomy    . Knee arthroscopy      right  . Tubal ligation      with reversal in 1994  . Bladder suspension  11/11/2011    Procedure: TRANSVAGINAL TAPE (TVT) PROCEDURE;  Surgeon: Levi Aland, MD;  Location: [facility];  Service: Gynecology;  Laterality: N/A;  . Cystoscopy  11/11/2011    Procedure: CYSTOSCOPY;  Surgeon: Levi Aland, MD;  Location: [facility];  Service: Gynecology;  Laterality: N/A;  . Doppler echocardiography  02/2013    LV FUNCTION, SIZE NORMAL; MILD CONCENTRIC LVH; EST EF 55-65%; WALL MOTION NORMAL  . Lexiscan myoview  02/2013    EF 50%; NORMAL MYOCARDIAL PERFUSION STUDY - breast attenuation  . Cardiac catheterization  07/2010    LVEF 50-55% WITH VERY MILD GLOBAL HYPOKINESIA; ESSENTIALLY NORMAL CORONARY ARTERIES; NORMAL LV FUNCTION  . Carotids  02/18/11    CAROTID DUPLEX; VERTEBRALS ARE PATENT WITH ANTEGRADE FLOW. ICA/CCA RATIO 1.61 ON RIGHT AND 0.75 ON LEFT    Allergies  Allergen Reactions  . Clindamycin/Lincomycin     Nausea, rash  . Lisinopril Cough  .  Methotrexate     REACTION: peri-oral and buccal lesions.    Current Outpatient Prescriptions  Medication Sig Dispense Refill  . adalimumab (HUMIRA) 40 MG/0.8ML injection Inject 40 mg into the skin every 14 (fourteen) days.      [name] albuterol (PROVENTIL HFA;VENTOLIN HFA) 108 (90 BASE) MCG/ACT inhaler Inhale 2 puffs into the lungs every 6 (six) hours as needed. For wheezing.      [name] albuterol (PROVENTIL) (5 MG/ML) 0.5% nebulizer solution Take 2.5 mg by nebulization every 6 (six) hours as needed. For  wheezing.      [name] allopurinol (ZYLOPRIM) 100 MG tablet Take 100 mg by mouth daily.      [name] aspirin EC 81 MG tablet Take 81 mg by mouth daily.      [name] atorvastatin (LIPITOR) 40 MG tablet Take 40 mg by mouth at bedtime.      . benzonatate (TESSALON) 100 MG capsule Take 1 capsule (100 mg total) by mouth every 6 (six) hours as needed for cough.  60 capsule  4  . buPROPion (WELLBUTRIN XL) 300 MG 24 hr tablet Take 300 mg by mouth daily.      . carvedilol (COREG) 12.5 MG tablet Take 1 tablet (12.5 mg total) by mouth 2 (two) times daily with a meal.  60 tablet  3  . chlorpheniramine-HYDROcodone (TUSSIONEX) 10-8 MG/5ML LQCR Take 10 mLs by mouth every 12 (twelve) hours as needed. For cough.  240 mL  0  . cholecalciferol (VITAMIN D) 1000 UNITS tablet Take 1,000 Units by mouth 2 (two) times daily.      . ciclesonide (OMNARIS) 50 MCG/ACT nasal spray Place 1 spray into both nostrils daily.       [name] dexlansoprazole (DEXILANT) 60 MG capsule Take 1 capsule (60 mg total) by mouth daily.  30 capsule  6  . estradiol (ESTRACE) 2 MG tablet Take 2 mg by mouth daily.      . fenofibrate (TRICOR) 145 MG tablet TAKE ONE TABLET BY MOUTH ONE TIME DAILY  30 tablet  6  . furosemide (LASIX) 40 MG tablet Take 40 mg by mouth 2 (two) times daily.      [name] glyBURIDE (DIABETA) 2.5 MG tablet Take 2.5 mg by mouth daily with breakfast.      . HYDROcodone-acetaminophen (NORCO/VICODIN) 5-325 MG per tablet Take 2 tablets by mouth every 4 (four) hours as needed for pain.  10 tablet  0  . insulin glargine (LANTUS SOLOSTAR) 100 UNIT/ML injection Inject 20 Units into the skin at bedtime.  1 pen  3  . LORazepam (ATIVAN) 1 MG tablet Take 1 mg by mouth 2 (two) times daily as needed for anxiety.      [name] losartan (COZAAR) 100 MG tablet Take 1 tablet (100 mg total) by mouth daily.  90 tablet  3  . metFORMIN (GLUCOPHAGE) 1000 MG tablet TAKE ONE TABLET BY MOUTH TWICE DAILY WITH A MEAL.  180 tablet  0  . Mometasone Furo-Formoterol Fum (DULERA) 200-5 MCG/ACT  AERO Inhale 2 puffs into the lungs 2 (two) times daily.  8.8 g  1  . nitroGLYCERIN (NITROSTAT) 0.4 MG SL tablet Place 0.4 mg under the tongue every 5 (five) minutes as needed. For chest pain.      [name] omeprazole (PRILOSEC) 20 MG capsule Take 20 mg by mouth at bedtime.      . potassium chloride SA (K-DUR,KLOR-CON) 20 MEQ tablet Take 20 mEq by mouth 3 (three) times daily.      . predniSONE (DELTASONE) 5  MG tablet Take 12.5 mg by mouth daily.      . promethazine (PHENERGAN) 25 MG tablet Take 1 tablet (25 mg total) by mouth every 6 (six) hours as needed for nausea.  60 tablet  3  . venlafaxine XR (EFFEXOR-XR) 75 MG 24 hr capsule Take 75 mg by mouth 2 (two) times daily.       No current facility-administered medications for this visit.   actually only taking 25 mg of losartan  History   Social History  . Marital Status: Married    Spouse Name: MARTASIA TALAMANTE    Number of Children: 2  . Years of Education: 12   Occupational History  . DISABLED    Social History Main Topics  . Smoking status: Never Smoker   . Smokeless tobacco: Never Used  . Alcohol Use: No  . Drug Use: No  . Sexually Active: Yes   Other Topics Concern  . Not on file   Social History Narrative  . No narrative on file    ROS: A comprehensive Review of Systems - Negative except pertinent positives as above.  PHYSICAL EXAM BP 100/60  Pulse 96  Ht 5\' 6"  (1.676 m)  Wt 262 lb 14.4 oz (119.251 kg)  BMI 42.45 kg/m2 General appearance: alert, cooperative, appears stated age, no distress and morbidly obese  Neck: no carotid bruit, supple, symmetrical, trachea midline, thyroid not enlarged, symmetric, no tenderness/mass/nodules and Unable to really assess JVD due to her body habitus was obese neck.  Lungs: clear to auscultation bilaterally, normal percussion bilaterally and Nonlabored  Heart: regular rate and rhythm, S1, S2 normal, no murmur, click, rub or gallop and Unable to palpate apical impulse  Abdomen: soft,  non-tender; bowel sounds normal; no masses, no organomegaly and Significant truncal obesity, unable to palpate HSM or determine HJR  Extremities: edema 1-2+ bilaterally, varicose veins noted and No erythema or rash. Small healing stasis ulcers  Pulses: 2+ and symmetric  [id number] today: No  ASSESSMENT: Overall relatively stable with no active symptoms to suggest ongoing coronary ischemia, especially in light of her negative nuclear stress test. She does have symptoms which are probably more consistent with elevated pulmonary pressures and diastolic LV dysfunction. But we do not have an accurate assessment of his pressures. I deferred decision reference right and left heart catheterization to her pulmonologist if he thinks that this would be helpful. Certainly one aspect of first dyspnea is her obesity, the other is probably sarcoid. She probably has some obesity hypoventilation syndrome. She seems to be adequately diuresis and close her dry weight. I would not make any adjustments to her current diuretic dose.  We'll simply continue her sliding scale Lasix as described last visit.  Chronic diastolic heart failure, NYHA class 2  Chest pain on exertion  HYPERTENSION  SLEEP APNEA  OBESITY   PLAN: Per problem list.  Followup: 6 months for reassessment.  Marykay Lex, M.D., M.S. [facility] [address] [place], [place]  [address]  [phone] Pager # [phone] 04/04/2013 10:36 PM

## 2013-04-11 ENCOUNTER — Encounter (HOSPITAL_COMMUNITY): Payer: Self-pay | Admitting: *Deleted

## 2013-04-11 ENCOUNTER — Emergency Department (HOSPITAL_COMMUNITY): Payer: BC Managed Care – PPO

## 2013-04-11 ENCOUNTER — Observation Stay (HOSPITAL_COMMUNITY)
Admission: EM | Admit: 2013-04-11 | Discharge: 2013-04-12 | Disposition: A | Discharge status: Home / Self Care | Payer: BC Managed Care – PPO | Attending: Internal Medicine | Admitting: Internal Medicine

## 2013-04-11 DIAGNOSIS — R079 Chest pain, unspecified: Secondary | ICD-10-CM | POA: Diagnosis not present

## 2013-04-11 DIAGNOSIS — E1165 Type 2 diabetes mellitus with hyperglycemia: Secondary | ICD-10-CM

## 2013-04-11 DIAGNOSIS — E119 Type 2 diabetes mellitus without complications: Secondary | ICD-10-CM | POA: Insufficient documentation

## 2013-04-11 DIAGNOSIS — J99 Respiratory disorders in diseases classified elsewhere: Secondary | ICD-10-CM | POA: Insufficient documentation

## 2013-04-11 DIAGNOSIS — E1149 Type 2 diabetes mellitus with other diabetic neurological complication: Secondary | ICD-10-CM | POA: Diagnosis not present

## 2013-04-11 DIAGNOSIS — I5032 Chronic diastolic (congestive) heart failure: Secondary | ICD-10-CM | POA: Insufficient documentation

## 2013-04-11 DIAGNOSIS — R05 Cough: Secondary | ICD-10-CM

## 2013-04-11 DIAGNOSIS — R053 Chronic cough: Secondary | ICD-10-CM

## 2013-04-11 DIAGNOSIS — E785 Hyperlipidemia, unspecified: Secondary | ICD-10-CM | POA: Insufficient documentation

## 2013-04-11 DIAGNOSIS — I10 Essential (primary) hypertension: Secondary | ICD-10-CM | POA: Insufficient documentation

## 2013-04-11 DIAGNOSIS — D869 Sarcoidosis, unspecified: Secondary | ICD-10-CM | POA: Insufficient documentation

## 2013-04-11 DIAGNOSIS — E1159 Type 2 diabetes mellitus with other circulatory complications: Secondary | ICD-10-CM | POA: Diagnosis present

## 2013-04-11 DIAGNOSIS — E1142 Type 2 diabetes mellitus with diabetic polyneuropathy: Secondary | ICD-10-CM

## 2013-04-11 DIAGNOSIS — E1129 Type 2 diabetes mellitus with other diabetic kidney complication: Secondary | ICD-10-CM | POA: Diagnosis present

## 2013-04-11 DIAGNOSIS — R799 Abnormal finding of blood chemistry, unspecified: Secondary | ICD-10-CM | POA: Insufficient documentation

## 2013-04-11 DIAGNOSIS — E1169 Type 2 diabetes mellitus with other specified complication: Secondary | ICD-10-CM | POA: Diagnosis present

## 2013-04-11 DIAGNOSIS — I152 Hypertension secondary to endocrine disorders: Secondary | ICD-10-CM | POA: Diagnosis present

## 2013-04-11 DIAGNOSIS — I200 Unstable angina: Secondary | ICD-10-CM | POA: Insufficient documentation

## 2013-04-11 DIAGNOSIS — K219 Gastro-esophageal reflux disease without esophagitis: Secondary | ICD-10-CM

## 2013-04-11 DIAGNOSIS — I5033 Acute on chronic diastolic (congestive) heart failure: Secondary | ICD-10-CM

## 2013-04-11 DIAGNOSIS — IMO0002 Reserved for concepts with insufficient information to code with codable children: Secondary | ICD-10-CM

## 2013-04-11 LAB — POCT I-STAT, CHEM 8
BUN: 12 mg/dL (ref 6–23)
Calcium, Ion: 1.13 mmol/L (ref 1.12–1.23)
Chloride: 96 mEq/L (ref 96–112)
Creatinine, Ser: 0.6 mg/dL (ref 0.50–1.10)
Glucose, Bld: 289 mg/dL — ABNORMAL HIGH (ref 70–99)
HCT: 37 % (ref 36.0–46.0)
Hemoglobin: 12.6 g/dL (ref 12.0–15.0)
Potassium: 3.5 mEq/L (ref 3.5–5.1)
Sodium: 136 mEq/L (ref 135–145)
TCO2: 27 mmol/L (ref 0–100)

## 2013-04-11 LAB — BASIC METABOLIC PANEL
BUN: 12 mg/dL (ref 6–23)
CO2: 30 mEq/L (ref 19–32)
Calcium: 9.3 mg/dL (ref 8.4–10.5)
Chloride: 93 mEq/L — ABNORMAL LOW (ref 96–112)
Creatinine, Ser: 0.63 mg/dL (ref 0.50–1.10)
GFR calc Af Amer: >90 mL/min (ref >90)
GFR calc non Af Amer: >90 mL/min (ref >90)
Glucose, Bld: 272 mg/dL — ABNORMAL HIGH (ref 70–99)
Potassium: 3.5 mEq/L (ref 3.5–5.1)
Sodium: 135 mEq/L (ref 135–145)

## 2013-04-11 LAB — POCT I-STAT TROPONIN I: Troponin i, poc: 0 ng/mL (ref 0.00–0.08)

## 2013-04-11 LAB — CBC
HCT: 34.4 % — ABNORMAL LOW (ref 36.0–46.0)
Hemoglobin: 11.7 g/dL — ABNORMAL LOW (ref 12.0–15.0)
MCH: 30.4 pg (ref 26.0–34.0)
MCHC: 34 g/dL (ref 30.0–36.0)
MCV: 89.4 fL (ref 78.0–100.0)
Platelets: 358 10*3/uL (ref 150–400)
RBC: 3.85 MIL/uL — ABNORMAL LOW (ref 3.87–5.11)
RDW: 13.3 % (ref 11.5–15.5)
WBC: 15.6 10*3/uL — ABNORMAL HIGH (ref 4.0–10.5)

## 2013-04-11 LAB — PRO B NATRIURETIC PEPTIDE: Pro B Natriuretic peptide (BNP): 193.5 pg/mL — ABNORMAL HIGH (ref 0–125)

## 2013-04-11 MED ORDER — INSULIN ASPART 100 UNIT/ML ~~LOC~~ SOLN
0.0000 [IU] | Freq: Three times a day (TID) | SUBCUTANEOUS | Status: DC
  Administered 2013-04-12: 2 [IU] via SUBCUTANEOUS

## 2013-04-11 MED ORDER — NITROGLYCERIN 0.4 MG SL SUBL
0.4000 mg | SUBLINGUAL_TABLET | SUBLINGUAL | Status: AC | PRN
  Administered 2013-04-11 – 2013-04-12 (×3): 0.4 mg via SUBLINGUAL
  Filled 2013-04-11 (×2): qty 25

## 2013-04-11 MED ORDER — NITROGLYCERIN 0.4 MG SL SUBL
SUBLINGUAL_TABLET | SUBLINGUAL | Status: AC
  Filled 2013-04-11: qty 25

## 2013-04-11 MED ORDER — ASPIRIN 81 MG PO CHEW
162.0000 mg | CHEWABLE_TABLET | Freq: Once | ORAL | Status: AC
  Administered 2013-04-11: 162 mg via ORAL
  Filled 2013-04-11: qty 2

## 2013-04-11 NOTE — H&P (Signed)
[facility] History and Physical  [name] MVH:[id number] DOB: 06/28/1961 DOA: 04/11/2013  Referring physician: ED. PCP: [name], MD  Specialists: [place].   Chief Complaint: Chest pain.   HPI: [name] is a 52 y.o. female with PMH significant for diastolic HF, Pulmonary sarcoidosis, Diabetes, had Myoview last month That show low risk stress nuclear study with extensive breast attenaution,but no evidence of ischemia or infarction who presents complaining of chest pain that started afternoon of admission. She had 2 episodes at home. She describes pain as pressure, left side of chest radiated to left arm. It was accompanied with diaphoresis and dyspnea. Pain was at rest. Patient took 2 dose of nitroglycerin at home which improved pain. She received one dose of nitroglycerin in the ED. Patient is chest pain free at this time.   Review of Systems: negative except as per HPI.   Past Medical History  Diagnosis Date  . DIABETES MELLITUS, TYPE II 08/21/2006  . HYPERLIPIDEMIA 08/21/2006  . GOUT 08/20/2010  . OBESITY 06/04/2009  . ANEMIA-UNSPECIFIED 09/18/2009  . HYPERTENSION 08/21/2006  . GERD 08/21/2006  . SLEEP APNEA 04/21/2009    last testing was negative  . Internal hemorrhoids   . Pulmonary sarcoidosis     Followed locally by pulmonology, but also by Dr. Sandy Salaam at [facility]  . Exertional chest pain     sharp, substernal, exertional  . Vocal cord dysfunction   . Chronic diastolic heart failure, NYHA class 2    Past Surgical History  Procedure Laterality Date  . Ventral hernia repair    . Nissen fundoplication  2004  . Cholecystectomy  1984  . Abdominal hysterectomy    . Knee arthroscopy      right  . Tubal ligation      with reversal in 1994  . Bladder suspension  11/11/2011    Procedure: TRANSVAGINAL TAPE (TVT) PROCEDURE;  Surgeon: Levi Aland, MD;  Location: [facility];  Service: Gynecology;  Laterality: N/A;  . Cystoscopy   11/11/2011    Procedure: CYSTOSCOPY;  Surgeon: Levi Aland, MD;  Location: [facility];  Service: Gynecology;  Laterality: N/A;  . Doppler echocardiography  02/2013    LV FUNCTION, SIZE NORMAL; MILD CONCENTRIC LVH; EST EF 55-65%; WALL MOTION NORMAL  . Lexiscan myoview  02/2013    EF 50%; NORMAL MYOCARDIAL PERFUSION STUDY - breast attenuation  . Cardiac catheterization  07/2010    LVEF 50-55% WITH VERY MILD GLOBAL HYPOKINESIA; ESSENTIALLY NORMAL CORONARY ARTERIES; NORMAL LV FUNCTION  . Carotids  02/18/11    CAROTID DUPLEX; VERTEBRALS ARE PATENT WITH ANTEGRADE FLOW. ICA/CCA RATIO 1.61 ON RIGHT AND 0.75 ON LEFT   Social History:  reports that she has never smoked. She has never used smokeless tobacco. She reports that she does not drink alcohol or use illicit drugs.   Allergies  Allergen Reactions  . Clindamycin/Lincomycin     Nausea, rash  . Lisinopril Cough  . Methotrexate     REACTION: peri-oral and buccal lesions.    Family History  Problem Relation Age of Onset  . Diabetes Father   . Heart attack Father   . Coronary artery disease Father   . Heart failure Father   . COPD Mother   . Emphysema Mother   . Asthma Mother   . Heart failure Mother   . Sarcoidosis Maternal Uncle   . Colon cancer Neg Hx   . Lung cancer Brother   . Cancer Brother   .  Diabetes Brother   . Heart attack Maternal Grandfather     Prior to Admission medications   Medication Sig Start Date End Date Taking? Authorizing Provider  adalimumab (HUMIRA) 40 MG/0.8ML injection Inject 40 mg into the skin every 14 (fourteen) days.   Yes Historical Provider, MD  allopurinol (ZYLOPRIM) 100 MG tablet Take 100 mg by mouth at bedtime.    Yes Historical Provider, MD  aspirin EC 81 MG tablet Take 81 mg by mouth at bedtime.    Yes Historical Provider, MD  atorvastatin (LIPITOR) 40 MG tablet Take 40 mg by mouth at bedtime.   Yes Historical Provider, MD  buPROPion (WELLBUTRIN XL) 300 MG 24 hr tablet Take 300 mg by mouth every  morning.    Yes Historical Provider, MD  carvedilol (COREG) 12.5 MG tablet Take 1 tablet (12.5 mg total) by mouth 2 (two) times daily with a meal. 03/12/13  Yes Doe-Hyun R Artist Pais, DO  chlorpheniramine-HYDROcodone (TUSSIONEX) 10-8 MG/5ML LQCR Take 10 mLs by mouth every 12 (twelve) hours as needed. For cough. 03/29/13  Yes Gordy Savers, MD  cholecalciferol (VITAMIN D) 1000 UNITS tablet Take 1,000 Units by mouth 2 (two) times daily.   Yes Historical Provider, MD  ciclesonide (OMNARIS) 50 MCG/ACT nasal spray Place 1 spray into both nostrils daily.    Yes Historical Provider, MD  dexlansoprazole (DEXILANT) 60 MG capsule Take 1 capsule (60 mg total) by mouth daily. 12/28/12  Yes Storm Frisk, MD  estradiol (ESTRACE) 2 MG tablet Take 2 mg by mouth at bedtime.    Yes Historical Provider, MD  fenofibrate (TRICOR) 145 MG tablet TAKE ONE TABLET BY MOUTH ONE TIME DAILY 03/07/13  Yes Marykay Lex, MD  furosemide (LASIX) 40 MG tablet Take 40 mg by mouth 2 (two) times daily. 03/06/13  Yes Marykay Lex, MD  glyBURIDE (DIABETA) 2.5 MG tablet Take 2.5 mg by mouth daily with breakfast.   Yes Historical Provider, MD  insulin glargine (LANTUS) 100 UNIT/ML injection Inject 23 Units into the skin at bedtime. 07/18/12  Yes Bruce Romilda Garret, MD  LORazepam (ATIVAN) 1 MG tablet Take 1 mg by mouth 2 (two) times daily as needed for anxiety.   Yes Historical Provider, MD  losartan (COZAAR) 100 MG tablet Take 100 mg by mouth at bedtime. 03/06/13  Yes Marykay Lex, MD  metFORMIN (GLUCOPHAGE) 1000 MG tablet Take 1,000 mg by mouth 2 (two) times daily with a meal.   Yes Historical Provider, MD  mometasone-formoterol (DULERA) 200-5 MCG/ACT AERO Inhale 2 puffs into the lungs 2 (two) times daily as needed. For wheezing 07/14/12  Yes Doe-Hyun R Artist Pais, DO  nitroGLYCERIN (NITROSTAT) 0.4 MG SL tablet Place 0.4 mg under the tongue every 5 (five) minutes as needed. x3 doses as needed for chest pain.   Yes Historical Provider, MD   omeprazole (PRILOSEC) 20 MG capsule Take 20 mg by mouth at bedtime.   Yes Historical Provider, MD  potassium chloride SA (K-DUR,KLOR-CON) 20 MEQ tablet Take 30 mEq by mouth 2 (two) times daily.    Yes Historical Provider, MD  predniSONE (DELTASONE) 5 MG tablet Take 12.5 mg by mouth every morning.    Yes Historical Provider, MD  venlafaxine XR (EFFEXOR-XR) 75 MG 24 hr capsule Take 150 mg by mouth at bedtime.    Yes Historical Provider, MD  albuterol (PROVENTIL HFA;VENTOLIN HFA) 108 (90 BASE) MCG/ACT inhaler Inhale 2 puffs into the lungs every 6 (six) hours as needed. For wheezing.    Historical  Provider, MD  albuterol (PROVENTIL) (5 MG/ML) 0.5% nebulizer solution Take 2.5 mg by nebulization every 6 (six) hours as needed. For wheezing.    Historical Provider, MD   Physical Exam: Filed Vitals:   04/11/13 2202 04/11/13 2204 04/11/13 2215 04/11/13 2230  BP: 155/66  156/79 131/87  Pulse:  93 91 93  Temp:      TempSrc:      Resp:   14 16  SpO2:  98% 99% 99%   General Appearance:    Alert, cooperative, no distress, appears stated age  Head:    Normocephalic, without obvious abnormality, atraumatic  Eyes:    PERRL, conjunctiva/corneas clear, EOM's intact,     Ears:    Normal TM's and external ear canals, both ears  Nose:   Nares normal, septum midline, mucosa normal, no drainage    or sinus tenderness  Throat:   Lips, mucosa, and tongue normal; teeth and gums normal  Neck:   Supple, symmetrical, trachea midline, no adenopathy;    thyroid:  no enlargement/tenderness/nodules; no carotid   bruit or JVD  Back:     Symmetric, no curvature, ROM normal, no CVA tenderness  Lungs:     Clear to auscultation bilaterally, respirations unlabored  Chest Wall:    No tenderness or deformity   Heart:    Regular rate and rhythm, S1 and S2 normal, no murmur, rub   or gallop     Abdomen:     Soft, non-tender, bowel sounds active all four quadrants,    no masses, no organomegaly        Extremities:    Extremities normal, atraumatic, no cyanosis or edema  Pulses:   2+ and symmetric all extremities  Skin:   Skin color, texture, turgor normal, no rashes or lesions  Lymph nodes:   Cervical, supraclavicular, and axillary nodes normal  Neurologic:   CNII-XII intact, normal strength, sensation and reflexes    throughout    Labs on Admission:  Basic Metabolic Panel:  Recent Labs Lab 04/11/13 2122 04/11/13 2220  NA 136 135  K 3.5 3.5  CL 96 93*  CO2  --  30  GLUCOSE 289* 272*  BUN 12 12  CREATININE 0.60 0.63  CALCIUM  --  9.3   Liver Function Tests: No results found for this basename: AST, ALT, ALKPHOS, BILITOT, PROT, ALBUMIN,  in the last 168 hours No results found for this basename: LIPASE, AMYLASE,  in the last 168 hours No results found for this basename: AMMONIA,  in the last 168 hours CBC:  Recent Labs Lab 04/11/13 2122 04/11/13 2220  WBC  --  15.6*  HGB 12.6 11.7*  HCT 37.0 34.4*  MCV  --  89.4  PLT  --  358   Cardiac Enzymes: No results found for this basename: CKTOTAL, CKMB, CKMBINDEX, TROPONINI,  in the last 168 hours  BNP (last 3 results)  Recent Labs  02/11/13 0058 03/06/13 1016 04/11/13 2117  PROBNP 437.1* 96.0 193.5*   CBG: No results found for this basename: GLUCAP,  in the last 168 hours  Radiological Exams on Admission: Dg Chest 2 View  04/11/2013   *RADIOLOGY REPORT*  Clinical Data: Chest pain and shortness of breath.  CHEST - 2 VIEW  Comparison: CT chest 02/11/2013 and PA and lateral chest 02/03/2013.  Findings: The lungs are clear.  Heart size is normal.  No pneumothorax or pleural fluid.  Surgical clips at the gastroesophageal junction noted.  IMPRESSION: No acute disease.  Original Report Authenticated By: Holley Dexter, M.D.    EKG: Independently reviewed. No st elevation.   Assessment/Plan Active Problems:   HYPERLIPIDEMIA   HYPERTENSION   Type II diabetes mellitus with neurological manifestations, uncontrolled   Chest  pain   1-Chest pain: Patient with multiple risk factors. She had a recent stress test, last month that show  Low risk stress nuclear study with extensive breast attenaution,but no evidence of ischemia or infarction..Admnit to telemetry, cycle cardiac enzymes. Continue with BB, aspirin. PRN nitroglycerin. Please inform patient cardiologist regarding admission.   2-Diabetes: continue with lantus. Hold metformin, glipizide while in he hospital.  3- Diastolic Heart Failure: Appears compensated. Continue with lasix 40 mg BID.  4-Depression: Continue with Wellbutrin.  5-Leukocytosis: repeat in am. No fever, chest x ray no evidence of infection. Check UA.      Code Status: presume full code.  Family Communication: Care discussed with patient and husband.  Disposition Plan: Admit under observation.   Time spent: 75 minutes.   Malikiah Debarr [facility] Pager [phone]  If 7PM-7AM, please contact night-coverage [email or web address] Password [facility] 04/11/2013, 11:52 PM

## 2013-04-11 NOTE — ED Notes (Signed)
Onset:  1500.  Chest pain at rest; radiating down left arm and lt. Shoulder. Pt. Became nauseated, diaphoretic.  Pt. Took x 2 ntg sl. And resolved but came back.

## 2013-04-11 NOTE — ED Provider Notes (Signed)
History    CSN: [id number] Arrival date & time 04/11/13  2044  First MD Initiated Contact with Patient 04/11/13 2209     Chief Complaint  Patient presents with  . Chest Pain   (Consider location/radiation/quality/duration/timing/severity/associated sxs/prior Treatment) The history is provided by the patient.  [name] is a 52 y.o. female hx of CHF, DM, HL here with chest pain. Left-sided chest pain starting 3 PM today. Radiate down the left arm and up her jaw. It was associated with diaphoresis as well. She took 2 nitroglycerin which helped with the pain. However afterwards the pain returned and she came to the ER. She has been follow with [facility] cardiology.   Past Medical History  Diagnosis Date  . DIABETES MELLITUS, TYPE II 08/21/2006  . HYPERLIPIDEMIA 08/21/2006  . GOUT 08/20/2010  . OBESITY 06/04/2009  . ANEMIA-UNSPECIFIED 09/18/2009  . HYPERTENSION 08/21/2006  . GERD 08/21/2006  . SLEEP APNEA 04/21/2009    last testing was negative  . Internal hemorrhoids   . Pulmonary sarcoidosis     Followed locally by pulmonology, but also by Dr. Sandy Salaam at [facility]  . Exertional chest pain     sharp, substernal, exertional  . Vocal cord dysfunction   . Chronic diastolic heart failure, NYHA class 2    Past Surgical History  Procedure Laterality Date  . Ventral hernia repair    . Nissen fundoplication  2004  . Cholecystectomy  1984  . Abdominal hysterectomy    . Knee arthroscopy      right  . Tubal ligation      with reversal in 1994  . Bladder suspension  11/11/2011    Procedure: TRANSVAGINAL TAPE (TVT) PROCEDURE;  Surgeon: Levi Aland, MD;  Location: [facility];  Service: Gynecology;  Laterality: N/A;  . Cystoscopy  11/11/2011    Procedure: CYSTOSCOPY;  Surgeon: Levi Aland, MD;  Location: [facility];  Service: Gynecology;  Laterality: N/A;  . Doppler echocardiography  02/2013    LV FUNCTION, SIZE NORMAL; MILD CONCENTRIC LVH; EST EF 55-65%; WALL MOTION  NORMAL  . Lexiscan myoview  02/2013    EF 50%; NORMAL MYOCARDIAL PERFUSION STUDY - breast attenuation  . Cardiac catheterization  07/2010    LVEF 50-55% WITH VERY MILD GLOBAL HYPOKINESIA; ESSENTIALLY NORMAL CORONARY ARTERIES; NORMAL LV FUNCTION  . Carotids  02/18/11    CAROTID DUPLEX; VERTEBRALS ARE PATENT WITH ANTEGRADE FLOW. ICA/CCA RATIO 1.61 ON RIGHT AND 0.75 ON LEFT   Family History  Problem Relation Age of Onset  . Diabetes Father   . Heart attack Father   . Coronary artery disease Father   . Heart failure Father   . COPD Mother   . Emphysema Mother   . Asthma Mother   . Heart failure Mother   . Sarcoidosis Maternal Uncle   . Colon cancer Neg Hx   . Lung cancer Brother   . Cancer Brother   . Diabetes Brother   . Heart attack Maternal Grandfather    History  Substance Use Topics  . Smoking status: Never Smoker   . Smokeless tobacco: Never Used  . Alcohol Use: No   OB History   Grav Para Term Preterm Abortions TAB SAB Ect Mult Living                 Review of Systems  Cardiovascular: Positive for chest pain.  All other systems reviewed and are negative.    Allergies  Clindamycin/lincomycin; Lisinopril; and Methotrexate  Home  Medications   Current Outpatient Rx  Name  Route  Sig  Dispense  Refill  . adalimumab (HUMIRA) 40 MG/0.8ML injection   Subcutaneous   Inject 40 mg into the skin every 14 (fourteen) days.         [name] allopurinol (ZYLOPRIM) 100 MG tablet   Oral   Take 100 mg by mouth at bedtime.          [name] aspirin EC 81 MG tablet   Oral   Take 81 mg by mouth at bedtime.          [name] atorvastatin (LIPITOR) 40 MG tablet   Oral   Take 40 mg by mouth at bedtime.         [name] buPROPion (WELLBUTRIN XL) 300 MG 24 hr tablet   Oral   Take 300 mg by mouth every morning.          . carvedilol (COREG) 12.5 MG tablet   Oral   Take 1 tablet (12.5 mg total) by mouth 2 (two) times daily with a meal.   60 tablet   3   . chlorpheniramine-HYDROcodone  (TUSSIONEX) 10-8 MG/5ML LQCR   Oral   Take 10 mLs by mouth every 12 (twelve) hours as needed. For cough.   240 mL   0   . cholecalciferol (VITAMIN D) 1000 UNITS tablet   Oral   Take 1,000 Units by mouth 2 (two) times daily.         . ciclesonide (OMNARIS) 50 MCG/ACT nasal spray   Each Nare   Place 1 spray into both nostrils daily.          [name] dexlansoprazole (DEXILANT) 60 MG capsule   Oral   Take 1 capsule (60 mg total) by mouth daily.   30 capsule   6   . estradiol (ESTRACE) 2 MG tablet   Oral   Take 2 mg by mouth at bedtime.          . fenofibrate (TRICOR) 145 MG tablet      TAKE ONE TABLET BY MOUTH ONE TIME DAILY   30 tablet   6   . furosemide (LASIX) 40 MG tablet   Oral   Take 40 mg by mouth 2 (two) times daily.         [name] glyBURIDE (DIABETA) 2.5 MG tablet   Oral   Take 2.5 mg by mouth daily with breakfast.         . insulin glargine (LANTUS) 100 UNIT/ML injection   Subcutaneous   Inject 23 Units into the skin at bedtime.         [name] LORazepam (ATIVAN) 1 MG tablet   Oral   Take 1 mg by mouth 2 (two) times daily as needed for anxiety.         [name] losartan (COZAAR) 100 MG tablet   Oral   Take 100 mg by mouth at bedtime.         . metFORMIN (GLUCOPHAGE) 1000 MG tablet   Oral   Take 1,000 mg by mouth 2 (two) times daily with a meal.         . mometasone-formoterol (DULERA) 200-5 MCG/ACT AERO   Inhalation   Inhale 2 puffs into the lungs 2 (two) times daily as needed. For wheezing         . nitroGLYCERIN (NITROSTAT) 0.4 MG SL tablet   Sublingual   Place 0.4 mg under the tongue every 5 (five) minutes as needed. x3 doses as needed  for chest pain.         [name] omeprazole (PRILOSEC) 20 MG capsule   Oral   Take 20 mg by mouth at bedtime.         . potassium chloride SA (K-DUR,KLOR-CON) 20 MEQ tablet   Oral   Take 30 mEq by mouth 2 (two) times daily.          . predniSONE (DELTASONE) 5 MG tablet   Oral   Take 12.5 mg by mouth every  morning.          . venlafaxine XR (EFFEXOR-XR) 75 MG 24 hr capsule   Oral   Take 150 mg by mouth at bedtime.          [name] albuterol (PROVENTIL HFA;VENTOLIN HFA) 108 (90 BASE) MCG/ACT inhaler   Inhalation   Inhale 2 puffs into the lungs every 6 (six) hours as needed. For wheezing.         [name] albuterol (PROVENTIL) (5 MG/ML) 0.5% nebulizer solution   Nebulization   Take 2.5 mg by nebulization every 6 (six) hours as needed. For wheezing.          BP 156/79  Pulse 91  Temp(Src) 98.3 F (36.8 C) (Oral)  Resp 14  SpO2 99% Physical Exam  Nursing note and vitals reviewed. Constitutional: She is oriented to person, place, and time. She appears well-developed and well-nourished.  Slightly uncomfortable   HENT:  Head: Normocephalic.  Mouth/Throat: Oropharynx is clear and moist.  Eyes: Conjunctivae are normal. Pupils are equal, round, and reactive to light.  Neck: Normal range of motion. Neck supple.  Cardiovascular: Normal rate, regular rhythm and normal heart sounds.   Pulmonary/Chest: Effort normal and breath sounds normal. No respiratory distress. She has no wheezes. She has no rales.  Abdominal: Soft. Bowel sounds are normal. She exhibits no distension. There is no tenderness. There is no rebound and no guarding.  Musculoskeletal: Normal range of motion.  Neurological: She is alert and oriented to person, place, and time.  Skin: Skin is warm and dry.  Psychiatric: She has a normal mood and affect. Her behavior is normal. Judgment and thought content normal.    ED Course  Procedures (including critical care time) Labs Reviewed  CBC - Abnormal; Notable for the following:    WBC 15.6 (*)    RBC 3.85 (*)    Hemoglobin 11.7 (*)    HCT 34.4 (*)    All other components within normal limits  PRO B NATRIURETIC PEPTIDE - Abnormal; Notable for the following:    Pro B Natriuretic peptide (BNP) 193.5 (*)    All other components within normal limits  POCT I-STAT, CHEM 8 - Abnormal;  Notable for the following:    Glucose, Bld 289 (*)    All other components within normal limits  BASIC METABOLIC PANEL  POCT I-STAT TROPONIN I   No results found. No diagnosis found.   Date: 04/11/2013  Rate: 98  Rhythm: normal sinus rhythm  QRS Axis: normal  Intervals: normal  ST/T Wave abnormalities: nonspecific ST changes  Conduction Disutrbances:none  Narrative Interpretation:   Old EKG Reviewed: unchanged    MDM  SHERETA CROTHERS is a 52 y.o. female here with chest pain. Given nitro and asa in the ED and is pain free. EKG unchanged. Trop neg x 1. BNP nl. Will admit given risk factors and concerning history for ACS.    Richardean Canal, MD 04/11/13 2306

## 2013-04-12 ENCOUNTER — Encounter: Payer: Self-pay | Admitting: Cardiology

## 2013-04-12 DIAGNOSIS — I200 Unstable angina: Secondary | ICD-10-CM

## 2013-04-12 DIAGNOSIS — R059 Cough, unspecified: Secondary | ICD-10-CM

## 2013-04-12 DIAGNOSIS — R079 Chest pain, unspecified: Secondary | ICD-10-CM | POA: Diagnosis not present

## 2013-04-12 DIAGNOSIS — R05 Cough: Secondary | ICD-10-CM | POA: Diagnosis not present

## 2013-04-12 LAB — URINALYSIS, ROUTINE W REFLEX MICROSCOPIC
Glucose, UA: NEGATIVE mg/dL
Hgb urine dipstick: NEGATIVE
Ketones, ur: 15 mg/dL — AB
Nitrite: NEGATIVE
Protein, ur: NEGATIVE mg/dL
Specific Gravity, Urine: 1.035 — ABNORMAL HIGH (ref 1.005–1.030)
Urobilinogen, UA: 1 mg/dL (ref 0.0–1.0)
pH: 6 (ref 5.0–8.0)

## 2013-04-12 LAB — BASIC METABOLIC PANEL
BUN: 14 mg/dL (ref 6–23)
CO2: 30 mEq/L (ref 19–32)
Calcium: 9.2 mg/dL (ref 8.4–10.5)
Chloride: 97 mEq/L (ref 96–112)
Creatinine, Ser: 0.65 mg/dL (ref 0.50–1.10)
GFR calc Af Amer: >90 mL/min (ref >90)
GFR calc non Af Amer: >90 mL/min (ref >90)
Glucose, Bld: 114 mg/dL — ABNORMAL HIGH (ref 70–99)
Potassium: 3.6 mEq/L (ref 3.5–5.1)
Sodium: 137 mEq/L (ref 135–145)

## 2013-04-12 LAB — CBC
HCT: 35.4 % — ABNORMAL LOW (ref 36.0–46.0)
Hemoglobin: 11.7 g/dL — ABNORMAL LOW (ref 12.0–15.0)
MCH: 29.6 pg (ref 26.0–34.0)
MCHC: 33.1 g/dL (ref 30.0–36.0)
MCV: 89.6 fL (ref 78.0–100.0)
Platelets: 343 10*3/uL (ref 150–400)
RBC: 3.95 MIL/uL (ref 3.87–5.11)
RDW: 13.2 % (ref 11.5–15.5)
WBC: 13.5 10*3/uL — ABNORMAL HIGH (ref 4.0–10.5)

## 2013-04-12 LAB — URINE MICROSCOPIC-ADD ON

## 2013-04-12 LAB — TROPONIN I
Troponin I: <0.3 ng/mL (ref <0.30)
Troponin I: <0.3 ng/mL (ref <0.30)
Troponin I: <0.3 ng/mL (ref <0.30)

## 2013-04-12 LAB — GLUCOSE, CAPILLARY
Glucose-Capillary: 119 mg/dL — ABNORMAL HIGH (ref 70–99)
Glucose-Capillary: 101 mg/dL — ABNORMAL HIGH (ref 70–99)
Glucose-Capillary: 162 mg/dL — ABNORMAL HIGH (ref 70–99)

## 2013-04-12 MED ORDER — MOMETASONE FURO-FORMOTEROL FUM 200-5 MCG/ACT IN AERO
2.0000 | INHALATION_SPRAY | Freq: Two times a day (BID) | RESPIRATORY_TRACT | Status: DC | PRN
  Filled 2013-04-12: qty 8.8

## 2013-04-12 MED ORDER — ALBUTEROL SULFATE HFA 108 (90 BASE) MCG/ACT IN AERS
2.0000 | INHALATION_SPRAY | Freq: Four times a day (QID) | RESPIRATORY_TRACT | Status: DC | PRN
  Filled 2013-04-12: qty 6.7

## 2013-04-12 MED ORDER — DEXLANSOPRAZOLE 60 MG PO CPDR: 60.0000 mg | DELAYED_RELEASE_CAPSULE | Freq: Two times a day (BID) | ORAL | Status: DC

## 2013-04-12 MED ORDER — PREDNISONE 2.5 MG PO TABS
12.5000 mg | ORAL_TABLET | Freq: Every morning | ORAL | Status: DC
  Administered 2013-04-12: 12.5 mg via ORAL
  Filled 2013-04-12: qty 1

## 2013-04-12 MED ORDER — ASPIRIN EC 81 MG PO TBEC
81.0000 mg | DELAYED_RELEASE_TABLET | Freq: Every day | ORAL | Status: DC
  Filled 2013-04-12: qty 1

## 2013-04-12 MED ORDER — ESTRADIOL 2 MG PO TABS
2.0000 mg | ORAL_TABLET | Freq: Every day | ORAL | Status: DC
  Filled 2013-04-12: qty 1

## 2013-04-12 MED ORDER — ATORVASTATIN CALCIUM 40 MG PO TABS
40.0000 mg | ORAL_TABLET | Freq: Every day | ORAL | Status: DC
  Filled 2013-04-12: qty 1

## 2013-04-12 MED ORDER — PANTOPRAZOLE SODIUM 40 MG PO TBEC
40.0000 mg | DELAYED_RELEASE_TABLET | Freq: Every day | ORAL | Status: DC
  Administered 2013-04-12: 40 mg via ORAL
  Filled 2013-04-12: qty 1

## 2013-04-12 MED ORDER — SODIUM CHLORIDE 0.9 % IJ SOLN: 3.0000 mL | INTRAMUSCULAR | Status: DC | PRN

## 2013-04-12 MED ORDER — SODIUM CHLORIDE 0.9 % IJ SOLN: 3.0000 mL | Freq: Two times a day (BID) | INTRAMUSCULAR | Status: DC

## 2013-04-12 MED ORDER — FUROSEMIDE 40 MG PO TABS
40.0000 mg | ORAL_TABLET | Freq: Two times a day (BID) | ORAL | Status: DC
  Administered 2013-04-12: 40 mg via ORAL
  Filled 2013-04-12 (×3): qty 1

## 2013-04-12 MED ORDER — POTASSIUM CHLORIDE CRYS ER 20 MEQ PO TBCR
30.0000 meq | EXTENDED_RELEASE_TABLET | Freq: Two times a day (BID) | ORAL | Status: DC
  Administered 2013-04-12: 30 meq via ORAL
  Filled 2013-04-12 (×2): qty 1

## 2013-04-12 MED ORDER — VENLAFAXINE HCL ER 150 MG PO CP24
150.0000 mg | ORAL_CAPSULE | Freq: Every day | ORAL | Status: DC
  Administered 2013-04-12: 150 mg via ORAL
  Filled 2013-04-12 (×2): qty 1

## 2013-04-12 MED ORDER — INSULIN GLARGINE 100 UNIT/ML ~~LOC~~ SOLN
23.0000 [IU] | Freq: Every day | SUBCUTANEOUS | Status: DC
  Administered 2013-04-12: 23 [IU] via SUBCUTANEOUS
  Filled 2013-04-12 (×2): qty 0.23

## 2013-04-12 MED ORDER — ONDANSETRON HCL 4 MG PO TABS: 4.0000 mg | ORAL_TABLET | Freq: Four times a day (QID) | ORAL | Status: DC | PRN

## 2013-04-12 MED ORDER — CARVEDILOL 12.5 MG PO TABS
12.5000 mg | ORAL_TABLET | Freq: Two times a day (BID) | ORAL | Status: DC
  Administered 2013-04-12 (×2): 12.5 mg via ORAL
  Filled 2013-04-12 (×3): qty 1

## 2013-04-12 MED ORDER — DILTIAZEM HCL ER COATED BEADS 120 MG PO CP24: 120.0000 mg | ORAL_CAPSULE | Freq: Every evening | ORAL | Status: DC

## 2013-04-12 MED ORDER — SODIUM CHLORIDE 0.9 % IJ SOLN
3.0000 mL | Freq: Two times a day (BID) | INTRAMUSCULAR | Status: DC
  Administered 2013-04-12 (×2): 3 mL via INTRAVENOUS

## 2013-04-12 MED ORDER — LORAZEPAM 1 MG PO TABS: 1.0000 mg | ORAL_TABLET | Freq: Two times a day (BID) | ORAL | Status: DC | PRN

## 2013-04-12 MED ORDER — HYDROCODONE-ACETAMINOPHEN 5-325 MG PO TABS
1.0000 | ORAL_TABLET | ORAL | Status: DC | PRN
Start: 2013-04-12 — End: 2013-04-12

## 2013-04-12 MED ORDER — SODIUM CHLORIDE 0.9 % IV SOLN: 250.0000 mL | INTRAVENOUS | Status: DC | PRN

## 2013-04-12 MED ORDER — ONDANSETRON HCL 4 MG/2ML IJ SOLN: 4.0000 mg | Freq: Four times a day (QID) | INTRAMUSCULAR | Status: DC | PRN

## 2013-04-12 MED ORDER — BUPROPION HCL ER (XL) 300 MG PO TB24
300.0000 mg | ORAL_TABLET | Freq: Every morning | ORAL | Status: DC
  Administered 2013-04-12: 300 mg via ORAL
  Filled 2013-04-12: qty 1

## 2013-04-12 MED ORDER — MORPHINE SULFATE 2 MG/ML IJ SOLN: 1.0000 mg | INTRAMUSCULAR | Status: DC | PRN

## 2013-04-12 MED ORDER — ALLOPURINOL 100 MG PO TABS
100.0000 mg | ORAL_TABLET | Freq: Every day | ORAL | Status: DC
  Filled 2013-04-12: qty 1

## 2013-04-12 MED ORDER — HEPARIN SODIUM (PORCINE) 5000 UNIT/ML IJ SOLN
5000.0000 [IU] | Freq: Three times a day (TID) | INTRAMUSCULAR | Status: DC
  Administered 2013-04-12 (×2): 5000 [IU] via SUBCUTANEOUS
  Filled 2013-04-12 (×4): qty 1

## 2013-04-12 MED ORDER — FENOFIBRATE 54 MG PO TABS
54.0000 mg | ORAL_TABLET | Freq: Every day | ORAL | Status: DC
  Administered 2013-04-12: 54 mg via ORAL
  Filled 2013-04-12: qty 1

## 2013-04-12 NOTE — Progress Notes (Addendum)
Pt seen and examined 51/F with DM, sarcoidosis, chronic diastolic CHF, low risk myoview 5/30, and normal coronaries on cath 07/2010 Admitted with chest pain/pressure last pm, EKG and 2 sets of enzymes negative so far. Another episode of chest pain, which improved with SL nitro Sarcoidosis: stable with normal Ct chest in March, April and May -s/p cholecystectomy I suspect Esophageal spasm: h/o Nissan Fundoplication, GERD -add Low dose CCB for this and PPI Will d/w [facility] and DC home later today if remains stable  Zannie Cove, MD [phone]

## 2013-04-12 NOTE — Care Management Note (Unsigned)
    Page 1 of 1   04/12/2013     3:09:45 PM   CARE MANAGEMENT NOTE 04/12/2013  Patient:  [name], [name]   Account Number:  [id number]  Date Initiated:  04/12/2013  Documentation initiated by:  Naveed Humphres  Subjective/Objective Assessment:   PT ADM ON 04/11/13 WITH CHEST PAIN.   PTA, PT INDEPENDENT, LIVES WITH SPOUSE.     Action/Plan:   WILL FOLLOW FOR HOME NEEDS AS PT PROGRESSES.   Anticipated DC Date:  04/13/2013   Anticipated DC Plan:  HOME/SELF CARE      DC Planning Services  CM consult      Choice offered to / List presented to:             Status of service:  In process, will continue to follow Medicare Important Message given?   (If response is "NO", the following Medicare IM given date fields will be blank) Date Medicare IM given:   Date Additional Medicare IM given:    Discharge Disposition:    Per UR Regulation:  Reviewed for med. necessity/level of care/duration of stay  If discussed at Long Length of Stay Meetings, dates discussed:    Comments:

## 2013-04-12 NOTE — Discharge Summary (Addendum)
Physician Discharge Summary  [name] ZOX:[id number] DOB: [birth year] DOA: 04/11/2013  PCP: [name], MD  Admit date: 04/11/2013 Discharge date: 04/12/2013  Time spent: 45 minutes  Recommendations for Outpatient Follow-up:  1. FU with Dr.HArding in 7-10days  Discharge Diagnoses:     Atypical Chest pain   HYPERLIPIDEMIA   HYPERTENSION   Type II diabetes mellitus with neurological manifestations, uncontrolled    GERD    H/o Nissan Fundoplication    Pulmonary sarcoidosis    Pulmonary hypertension    Chronic diastolic CHF  Discharge Condition: stable  Diet recommendation: low sodium, diabetic  Filed Weights   04/12/13 0100  Weight: 119.296 kg (263 lb)    History of present illness:  [name] is a 52 y.o. female with PMH significant for diastolic HF, Pulmonary sarcoidosis, Diabetes, had Myoview last month That show low risk stress nuclear study with extensive breast attenaution,but no evidence of ischemia or infarction who presents complaining of chest pain that started afternoon of admission. She had 2 episodes at home. She describes pain as pressure, left side of chest radiated to left arm. It was accompanied with diaphoresis and dyspnea. Pain was at rest. Patient took 2 dose of nitroglycerin at home which improved pain. She received one dose of nitroglycerin in the ED. Patient is chest pain free by the time of admission   Hospital Course:   She ruled out for MI based on normal cardiac enzymes x3 and benign EKG, No events on Telemetry        - Had a low risk myoview 03/09/13 and normal coronaries on cath 07/2010       -From her Heart failure standpoint she is clinically euvolemic with BNP of 190.       -Sarcoidosis has been stable with normal CT chest in March, April and May  - She is s/p cholecystectomy  -At this point I suspect Esophageal spasm given h/o improvement with SL nitro,  h/o Nissan Fundoplication, GERD  -I added Low dose CCB for this and  increased PPI to BID -I called and d/w Dr.Harding her Cardiologist who agreed with this plan, she will Fu with Dr.Harding in 7-10 days and Dr.[name] her Pulmonologist on Monday   Consultations:  None, Called and D/w Dr.Harding her Cardiologist via Telephone  Discharge Exam: Filed Vitals:   04/12/13 0822 04/12/13 0839 04/12/13 0844 04/12/13 1608  BP: 148/79 167/96 157/91 128/49  Pulse: 89  91 85  Temp:    98.2 F (36.8 C)  TempSrc:    Oral  Resp:    18  Height:      Weight:      SpO2:    99%    General: AAOx3 Cardiovascular:S1S2/RRR Respiratory: CTAB  Discharge Instructions  Discharge Orders   Future Appointments Provider Department Dept Phone   04/16/2013 3:00 PM Storm Frisk, MD [facility] [phone]   06/12/2013 9:00 AM Lindley Magnus, MD [facility] [phone]   Future Orders Complete By Expires     Diet - low sodium heart healthy  As directed     Diet Carb Modified  As directed     Increase activity slowly  As directed         Medication List         albuterol 108 (90 BASE) MCG/ACT inhaler  Commonly known as:  PROVENTIL HFA;VENTOLIN HFA  Inhale 2 puffs into the lungs every 6 (six) hours as needed. For wheezing.     albuterol (  5 MG/ML) 0.5% nebulizer solution  Commonly known as:  PROVENTIL  Take 2.5 mg by nebulization every 6 (six) hours as needed. For wheezing.     allopurinol 100 MG tablet  Commonly known as:  ZYLOPRIM  Take 100 mg by mouth at bedtime.     aspirin EC 81 MG tablet  Take 81 mg by mouth at bedtime.     atorvastatin 40 MG tablet  Commonly known as:  LIPITOR  Take 40 mg by mouth at bedtime.     buPROPion 300 MG 24 hr tablet  Commonly known as:  WELLBUTRIN XL  Take 300 mg by mouth every morning.     carvedilol 12.5 MG tablet  Commonly known as:  COREG  Take 1 tablet (12.5 mg total) by mouth 2 (two) times daily with a meal.     chlorpheniramine-HYDROcodone 10-8 MG/5ML Lqcr  Commonly known as:   TUSSIONEX  Take 10 mLs by mouth every 12 (twelve) hours as needed. For cough.     cholecalciferol 1000 UNITS tablet  Commonly known as:  VITAMIN D  Take 1,000 Units by mouth 2 (two) times daily.     ciclesonide 50 MCG/ACT nasal spray  Commonly known as:  OMNARIS  Place 1 spray into both nostrils daily.     dexlansoprazole 60 MG capsule  Commonly known as:  DEXILANT  Take 1 capsule (60 mg total) by mouth 2 (two) times daily.     diltiazem 120 MG 24 hr capsule  Commonly known as:  CARDIZEM CD  Take 1 capsule (120 mg total) by mouth every evening.     estradiol 2 MG tablet  Commonly known as:  ESTRACE  Take 2 mg by mouth at bedtime.     fenofibrate 145 MG tablet  Commonly known as:  TRICOR  TAKE ONE TABLET BY MOUTH ONE TIME DAILY     furosemide 40 MG tablet  Commonly known as:  LASIX  Take 40 mg by mouth 2 (two) times daily.     glyBURIDE 2.5 MG tablet  Commonly known as:  DIABETA  Take 2.5 mg by mouth daily with breakfast.     HUMIRA 40 MG/0.8ML injection  Generic drug:  adalimumab  Inject 40 mg into the skin every 14 (fourteen) days.     insulin glargine 100 UNIT/ML injection  Commonly known as:  LANTUS  Inject 23 Units into the skin at bedtime.     LORazepam 1 MG tablet  Commonly known as:  ATIVAN  Take 1 mg by mouth 2 (two) times daily as needed for anxiety.     losartan 100 MG tablet  Commonly known as:  COZAAR  Take 100 mg by mouth at bedtime.     metFORMIN 1000 MG tablet  Commonly known as:  GLUCOPHAGE  Take 1,000 mg by mouth 2 (two) times daily with a meal.     mometasone-formoterol 200-5 MCG/ACT Aero  Commonly known as:  DULERA  Inhale 2 puffs into the lungs 2 (two) times daily as needed. For wheezing     nitroGLYCERIN 0.4 MG SL tablet  Commonly known as:  NITROSTAT  Place 0.4 mg under the tongue every 5 (five) minutes as needed. x3 doses as needed for chest pain.     omeprazole 20 MG capsule  Commonly known as:  PRILOSEC  Take 20 mg by mouth at  bedtime.     potassium chloride SA 20 MEQ tablet  Commonly known as:  K-DUR,KLOR-CON  Take 30 mEq by mouth 2 (  two) times daily.     predniSONE 5 MG tablet  Commonly known as:  DELTASONE  Take 12.5 mg by mouth every morning.     venlafaxine XR 75 MG 24 hr capsule  Commonly known as:  EFFEXOR-XR  Take 150 mg by mouth at bedtime.       Allergies  Allergen Reactions  . Clindamycin/Lincomycin     Nausea, rash  . Lisinopril Cough  . Methotrexate     REACTION: peri-oral and buccal lesions.       Follow-up Information   Follow up with HARDING,DAVID W, MD. Schedule an appointment as soon as possible for a visit in 10 days.   Contact information:   [address] [place] [address] [phone]        The results of significant diagnostics from this hospitalization (including imaging, microbiology, ancillary and laboratory) are listed below for reference.    Significant Diagnostic Studies: Dg Chest 2 View  04/11/2013   *RADIOLOGY REPORT*  Clinical Data: Chest pain and shortness of breath.  CHEST - 2 VIEW  Comparison: CT chest 02/11/2013 and PA and lateral chest 02/03/2013.  Findings: The lungs are clear.  Heart size is normal.  No pneumothorax or pleural fluid.  Surgical clips at the gastroesophageal junction noted.  IMPRESSION: No acute disease.   Original Report Authenticated By: Holley Dexter, M.D.    Microbiology: No results found for this or any previous visit (from the past 240 hour(s)).   Labs: Basic Metabolic Panel:  Recent Labs Lab 04/11/13 2122 04/11/13 2220 04/12/13 0745  NA 136 135 137  K 3.5 3.5 3.6  CL 96 93* 97  CO2  --  30 30  GLUCOSE 289* 272* 114*  BUN 12 12 14   CREATININE 0.60 0.63 0.65  CALCIUM  --  9.3 9.2   Liver Function Tests: No results found for this basename: AST, ALT, ALKPHOS, BILITOT, PROT, ALBUMIN,  in the last 168 hours No results found for this basename: LIPASE, AMYLASE,  in the last 168 hours No results found  for this basename: AMMONIA,  in the last 168 hours CBC:  Recent Labs Lab 04/11/13 2122 04/11/13 2220 04/12/13 0745  WBC  --  15.6* 13.5*  HGB 12.6 11.7* 11.7*  HCT 37.0 34.4* 35.4*  MCV  --  89.4 89.6  PLT  --  358 343   Cardiac Enzymes:  Recent Labs Lab 04/12/13 0200 04/12/13 0745 04/12/13 1345  TROPONINI <0.30 <0.30 <0.30   BNP: BNP (last 3 results)  Recent Labs  02/11/13 0058 03/06/13 1016 04/11/13 2117  PROBNP 437.1* 96.0 193.5*   CBG:  Recent Labs Lab 04/12/13 0116 04/12/13 0600 04/12/13 1113  GLUCAP 119* 101* 162*       Signed:  Lissette Schenk  [facility] 04/12/2013, 5:26 PM

## 2013-04-12 NOTE — Assessment & Plan Note (Addendum)
Her blood pressures actually with a low today. May consider reducing her carvedilol to 6.25 mm twice a day. She is actually full dose of her losartan she's only on 25 visit until. She said to just evaluated this morning her blood pressure 140/80. So I think we'll just continue what she is right now. I did recommend that she spaces out her antihypertensives and taken her Cozaar at night.

## 2013-04-12 NOTE — Assessment & Plan Note (Signed)
I do not think this is coronary artery disease related discomfort. I think that her chest pain may very well be partially cardiac in nature with increased diastolic pressures and/or pulmonary pressures in this patient with obesity, sleep apnea and,) sarcoidosis. A number the seems to cause her symptoms. She probably has pulmonary hypertension but has not been medically evaluated. Unfortunately echocardiogram was not helpful. She's had a negative cardiac catheterization in the past followed up with Myoview to be negative for ischemia, it is unlikely that her recurrent symptoms are ischemic in nature.

## 2013-04-12 NOTE — Progress Notes (Signed)
Patient experienced another episode of chest pain.  Administered one SL Nitro and placed back on 2L of oxygen.  Now chest pain free.  Will continue to monitor.  [name]

## 2013-04-12 NOTE — Assessment & Plan Note (Addendum)
I not sure how much a complement her heart value is truly diastolic, because we really could not get a good assessment amount from her echocardiogram. It could just be simply pulmonary hypertension. To further delineate this a right heart catheterization would be necessary. I don't think that it will really changed much in the way of her treatment. If her pulmonologist would feel otherwise, be happy to perform right and left heart catheterization to clarify.  For now we'll just continue with her sliding scale Lasix, as I described her last visit.

## 2013-04-12 NOTE — ED Notes (Signed)
Report to floor. Pt transported via stretcher to floor.

## 2013-04-12 NOTE — Assessment & Plan Note (Signed)
Unfortunately, no motivation we'll do much with exercise to help her weight loss. I think any difficulty we will help. She did benefit from dietary counseling. We could consider referring her to a nutritionist either private or through the [facility] system.  I would defer this decision to her primary physician.

## 2013-04-13 NOTE — Progress Notes (Signed)
Assessment unchanged. Discussed D/C instructions with pt. Verbalized understanding. RX called into pharmacy of pt's choice. IV and tele removed. Pt left via W/C accompanied by NT.

## 2013-04-16 ENCOUNTER — Encounter: Payer: Self-pay | Admitting: Critical Care Medicine

## 2013-04-16 ENCOUNTER — Ambulatory Visit (INDEPENDENT_AMBULATORY_CARE_PROVIDER_SITE_OTHER): Payer: BC Managed Care – PPO | Admitting: Critical Care Medicine

## 2013-04-16 VITALS — BP 126/70 | HR 84 | Temp 98.0°F | Ht 66.0 in | Wt 266.2 lb

## 2013-04-16 DIAGNOSIS — D869 Sarcoidosis, unspecified: Secondary | ICD-10-CM

## 2013-04-16 DIAGNOSIS — D860 Sarcoidosis of lung: Secondary | ICD-10-CM

## 2013-04-16 DIAGNOSIS — J99 Respiratory disorders in diseases classified elsewhere: Secondary | ICD-10-CM | POA: Diagnosis not present

## 2013-04-16 NOTE — Progress Notes (Signed)
Subjective:    Patient ID: [name], female    DOB: 1961/08/27, 52 y.o.   MRN: [id number]  HPI  62 WF with Sarcoid, GERD, VCD, cyclical cough  04/17/2013 Chief Complaint  Patient presents with  . Follow-up    Has been in [facility] twice since last OV.  Breathing is "ok" now.  Does still have SOB when walking, a little coughing - dry, and chest pain.  Per pt, Dr. Dorethea Clan would like to proceed with right heart cath.   Pt has been in hosp two times since last OV 02/2013 with CHF , then this past week with chest pain but r/o coronary issues.  Sees Dr Herbie Baltimore ?[facility] to check pressures.  Notes some cough but not daily. Cough is dry and nagging.  No real wheezing.  No real edema in feet.  No difficulty swallowing.  No heartburn or indigestion.   Pt notes pressure and radiates to the arm  Review of Systems  Constitutional:   No  weight loss, night sweats,  Fevers, chills, fatigue, or  lassitude.  HEENT:   No headaches,  Difficulty swallowing,  Tooth/dental problems, or  Sore throat,                No sneezing, itching, ear ache,  +nasal congestion, post nasal drip,   CV:  No chest pain,  Orthopnea, PND, swelling in lower extremities, anasarca, dizziness, palpitations, syncope.   GI  No heartburn, indigestion, abdominal pain, nausea, vomiting, diarrhea, change in bowel habits, loss of appetite, bloody stools.   Resp:  ,  No coughing up of blood.   [name]  No chest wall deformity  Skin: no rash or lesions.  GU: no dysuria, change in color of urine, no urgency or frequency.  No flank pain, no hematuria   MS:  No joint pain or swelling.  No decreased range of motion.  No back pain.  Psych:  No change in mood or affect. No depression or anxiety.  No memory loss.     Objective:   Physical Exam BP 126/70  Pulse 84  Temp(Src) 98 F (36.7 C) (Oral)  Ht 5\' 6"  (1.676 m)  Wt 266 lb 3.2 oz (120.748 kg)  BMI 42.99 kg/m2  SpO2 96%  GEN: A/Ox3; pleasant , NAD , obese  HEENT:  [place]/AT,   EACs-clear, TMs-wnl, NOSE-clear drainage THROAT-clear, no lesions, no postnasal drip or exudate noted.   NECK:  Supple w/ fair ROM; no JVD; normal carotid impulses w/o bruits; no thyromegaly or nodules palpated; no lymphadenopathy.  RESP  Coarse BS  w/o, wheezes/ rales/ or rhonchi.no accessory muscle use, no dullness to percussion  CARD:  RRR, no m/r/g  , no peripheral edema, pulses intact, no cyanosis or clubbing.  GI:   Soft & nt; nml bowel sounds; no organomegaly or masses detected.  Musco: Warm bil, no deformities or joint swelling noted.   Neuro: alert, no focal deficits noted.    Skin: Warm, no lesions or rashes     Assessment & Plan:   Sarcoidosis of lung Sarcoid with poss pulm HTN thought echo not impressive Plan Right heart cath per cardiology Repeat PFTs to see if DLCO has fallen.   No change in medications    Updated Medication List Outpatient Encounter Prescriptions as of 04/16/2013  Medication Sig Dispense Refill  . adalimumab (HUMIRA) 40 MG/0.8ML injection Inject 40 mg into the skin every 14 (fourteen) days.      [name] albuterol (PROVENTIL HFA;VENTOLIN HFA) 108 (90  BASE) MCG/ACT inhaler Inhale 2 puffs into the lungs every 6 (six) hours as needed. For wheezing.      [name] albuterol (PROVENTIL) (5 MG/ML) 0.5% nebulizer solution Take 2.5 mg by nebulization every 6 (six) hours as needed. For wheezing.      [name] allopurinol (ZYLOPRIM) 100 MG tablet Take 100 mg by mouth at bedtime.       [name] aspirin EC 81 MG tablet Take 81 mg by mouth at bedtime.       [name] atorvastatin (LIPITOR) 40 MG tablet Take 40 mg by mouth at bedtime.      [name] buPROPion (WELLBUTRIN XL) 300 MG 24 hr tablet Take 300 mg by mouth every morning.       . carvedilol (COREG) 12.5 MG tablet Take 1 tablet (12.5 mg total) by mouth 2 (two) times daily with a meal.  60 tablet  3  . chlorpheniramine-HYDROcodone (TUSSIONEX) 10-8 MG/5ML LQCR Take 10 mLs by mouth every 12 (twelve) hours as needed. For cough.  240 mL  0  .  cholecalciferol (VITAMIN D) 1000 UNITS tablet Take 1,000 Units by mouth 2 (two) times daily.      . ciclesonide (OMNARIS) 50 MCG/ACT nasal spray Place 1 spray into both nostrils daily.       [name] dexlansoprazole (DEXILANT) 60 MG capsule Take 1 capsule (60 mg total) by mouth 2 (two) times daily.  30 capsule    . diltiazem (CARDIZEM CD) 120 MG 24 hr capsule Take 1 capsule (120 mg total) by mouth every evening.  30 capsule  0  . estradiol (ESTRACE) 2 MG tablet Take 2 mg by mouth at bedtime.       . fenofibrate (TRICOR) 145 MG tablet TAKE ONE TABLET BY MOUTH ONE TIME DAILY  30 tablet  6  . furosemide (LASIX) 40 MG tablet Take 40 mg by mouth 2 (two) times daily.      [name] glyBURIDE (DIABETA) 2.5 MG tablet Take 2.5 mg by mouth daily with breakfast.      . insulin glargine (LANTUS) 100 UNIT/ML injection Inject 23 Units into the skin at bedtime.      [name] LORazepam (ATIVAN) 1 MG tablet Take 1 mg by mouth 2 (two) times daily as needed for anxiety.      [name] losartan (COZAAR) 100 MG tablet Take 100 mg by mouth at bedtime.      . metFORMIN (GLUCOPHAGE) 1000 MG tablet Take 1,000 mg by mouth 2 (two) times daily with a meal.      . mometasone-formoterol (DULERA) 200-5 MCG/ACT AERO Inhale 2 puffs into the lungs 2 (two) times daily as needed. For wheezing      . nitroGLYCERIN (NITROSTAT) 0.4 MG SL tablet Place 0.4 mg under the tongue every 5 (five) minutes as needed. x3 doses as needed for chest pain.      [name] omeprazole (PRILOSEC) 20 MG capsule Take 20 mg by mouth at bedtime.      . potassium chloride SA (K-DUR,KLOR-CON) 20 MEQ tablet Take 30 mEq by mouth 2 (two) times daily.       . predniSONE (DELTASONE) 5 MG tablet Take 12.5 mg by mouth every morning.       . venlafaxine XR (EFFEXOR-XR) 75 MG 24 hr capsule Take 150 mg by mouth at bedtime.        No facility-administered encounter medications on file as of 04/16/2013.

## 2013-04-16 NOTE — Patient Instructions (Addendum)
Pulmonary function testing will be obtained I will notify Dr Herbie Baltimore I agree with the right heart catheterization No change in medications Return 2 months

## 2013-04-17 NOTE — Assessment & Plan Note (Signed)
Sarcoid with poss pulm HTN thought echo not impressive Plan Right heart cath per cardiology Repeat PFTs to see if DLCO has fallen.   No change in medications

## 2013-04-18 ENCOUNTER — Ambulatory Visit (INDEPENDENT_AMBULATORY_CARE_PROVIDER_SITE_OTHER): Payer: BC Managed Care – PPO | Admitting: Cardiology

## 2013-04-18 ENCOUNTER — Encounter: Payer: Self-pay | Admitting: Cardiology

## 2013-04-18 ENCOUNTER — Encounter (HOSPITAL_COMMUNITY): Payer: Self-pay | Admitting: Pharmacy Technician

## 2013-04-18 VITALS — BP 122/70 | HR 80 | Ht 66.0 in | Wt 268.5 lb

## 2013-04-18 DIAGNOSIS — Z01818 Encounter for other preprocedural examination: Secondary | ICD-10-CM

## 2013-04-18 DIAGNOSIS — I5032 Chronic diastolic (congestive) heart failure: Secondary | ICD-10-CM | POA: Diagnosis not present

## 2013-04-18 DIAGNOSIS — R079 Chest pain, unspecified: Secondary | ICD-10-CM

## 2013-04-18 DIAGNOSIS — I10 Essential (primary) hypertension: Secondary | ICD-10-CM

## 2013-04-18 DIAGNOSIS — D869 Sarcoidosis, unspecified: Secondary | ICD-10-CM | POA: Diagnosis not present

## 2013-04-18 DIAGNOSIS — G4730 Sleep apnea, unspecified: Secondary | ICD-10-CM

## 2013-04-18 DIAGNOSIS — R5383 Other fatigue: Secondary | ICD-10-CM

## 2013-04-18 DIAGNOSIS — D689 Coagulation defect, unspecified: Secondary | ICD-10-CM

## 2013-04-18 DIAGNOSIS — D860 Sarcoidosis of lung: Secondary | ICD-10-CM

## 2013-04-18 DIAGNOSIS — E785 Hyperlipidemia, unspecified: Secondary | ICD-10-CM

## 2013-04-18 DIAGNOSIS — J99 Respiratory disorders in diseases classified elsewhere: Secondary | ICD-10-CM

## 2013-04-18 DIAGNOSIS — R5381 Other malaise: Secondary | ICD-10-CM

## 2013-04-18 NOTE — Patient Instructions (Addendum)
.    Will schedule for next week.Your physician has requested that you have a cardiac catheterization. Cardiac catheterization is used to diagnose and/or treat various heart conditions. Doctors may recommend this procedure for a number of different reasons. The most common reason is to evaluate chest pain. Chest pain can be a symptom of coronary artery disease (CAD), and cardiac catheterization can show whether plaque is narrowing or blocking your heart's arteries. This procedure is also used to evaluate the valves, as well as measure the blood flow and oxygen levels in different parts of your heart. For further information please visit [email or web address] Please follow instruction sheet, as given.    Your physician recommends that you have  lab work done for cath next week.

## 2013-04-19 LAB — CBC
HCT: 33.3 % — ABNORMAL LOW (ref 36.0–46.0)
Hemoglobin: 10.9 g/dL — ABNORMAL LOW (ref 12.0–15.0)
MCH: 28.8 pg (ref 26.0–34.0)
MCHC: 32.7 g/dL (ref 30.0–36.0)
MCV: 88.1 fL (ref 78.0–100.0)
Platelets: 317 10*3/uL (ref 150–400)
RBC: 3.78 MIL/uL — ABNORMAL LOW (ref 3.87–5.11)
RDW: 13.5 % (ref 11.5–15.5)
WBC: 12.6 10*3/uL — ABNORMAL HIGH (ref 4.0–10.5)

## 2013-04-19 LAB — BASIC METABOLIC PANEL
BUN: 14 mg/dL (ref 6–23)
CO2: 26 mEq/L (ref 19–32)
Calcium: 9 mg/dL (ref 8.4–10.5)
Chloride: 101 mEq/L (ref 96–112)
Creat: 0.6 mg/dL (ref 0.50–1.10)
Glucose, Bld: 204 mg/dL — ABNORMAL HIGH (ref 70–99)
Potassium: 4.2 mEq/L (ref 3.5–5.3)
Sodium: 139 mEq/L (ref 135–145)

## 2013-04-19 LAB — PROTIME-INR
INR: 0.95 (ref <1.50)
Prothrombin Time: 12.7 seconds (ref 11.6–15.2)

## 2013-04-19 LAB — APTT: aPTT: 28 seconds (ref 24–37)

## 2013-04-19 LAB — TSH: TSH: 4.181 u[IU]/mL (ref 0.350–4.500)

## 2013-04-20 ENCOUNTER — Telehealth: Payer: Self-pay | Admitting: *Deleted

## 2013-04-20 ENCOUNTER — Other Ambulatory Visit: Payer: Self-pay | Admitting: *Deleted

## 2013-04-20 DIAGNOSIS — Z01810 Encounter for preprocedural cardiovascular examination: Secondary | ICD-10-CM

## 2013-04-20 NOTE — Telephone Encounter (Signed)
Left message that cath is a go for 7/14. Results given. Glucose up a little.

## 2013-04-20 NOTE — Telephone Encounter (Signed)
Message copied by Tobin Chad on Fri Apr 20, 2013  5:08 PM ------      Message from: [facility], DAVID      Created: Fri Apr 20, 2013  3:26 PM       Ok - sugar is up a bit.            OK for Cath ------

## 2013-04-22 ENCOUNTER — Encounter: Payer: Self-pay | Admitting: Cardiology

## 2013-04-22 DIAGNOSIS — R5383 Other fatigue: Secondary | ICD-10-CM | POA: Insufficient documentation

## 2013-04-22 DIAGNOSIS — D689 Coagulation defect, unspecified: Secondary | ICD-10-CM | POA: Insufficient documentation

## 2013-04-22 DIAGNOSIS — Z01818 Encounter for other preprocedural examination: Secondary | ICD-10-CM | POA: Insufficient documentation

## 2013-04-22 DIAGNOSIS — R5381 Other malaise: Secondary | ICD-10-CM | POA: Insufficient documentation

## 2013-04-22 NOTE — Assessment & Plan Note (Addendum)
Again not sure how much of her chest tightness or shortness of breathhistory Lee cardiac in nature. His is diastolic dysfunction versus old hypertension, right heart catheterization with left digital and diastolic pressure to correlate would be vital to make his final diagnoses.   Her weight is 4 pounds up from last visit. She continues to be on her standing dose of furosemide, does not note worsening edema.  LEFT AND RIGHT HEART CATHETERIZATION WITH CORONARY ANGIOGRAM

## 2013-04-22 NOTE — Assessment & Plan Note (Signed)
Preop Cath labs and studies:  Plan: Basic Metabolic Panel (BMET), CBC, INR/PT, PTT, TSH

## 2013-04-22 NOTE — Assessment & Plan Note (Signed)
Not sure what this is from. She is clearly deconditioned. We'll then check a chest level. In part of the pre-cath labs will be checking CBC just to rule outrelative anemia and hypothyroidism.[name]

## 2013-04-22 NOTE — Assessment & Plan Note (Addendum)
Despite normal Myoview stress test, she continues to have symptoms. With her risk factors of obesity, diabetes and dyslipidemia, think full defecation of her coronary anatomy is reasonable to confirm her current stress test as being accurate. This would serve to help future evaluation of her chest discomfort obviously ruling out coronary artery disease.  Plan:  LEFT AND RIGHT HEART CATHETERIZATION WITH CORONARY ANGIOGRAM

## 2013-04-22 NOTE — Assessment & Plan Note (Signed)
Currently followed by her PCP. On statin and fibrate.

## 2013-04-22 NOTE — Assessment & Plan Note (Signed)
Well controlled with her medications. No changes for now. We'll make further adjustments based on the cardiac cath findings.

## 2013-04-22 NOTE — Progress Notes (Signed)
 Patient ID: [name], female   DOB: 10/07/1961, 51 y.o.   MRN: [id number]  Clinic Note: HPI: [name] is a 51 y.o. female with a PMH below I last saw onJune 24 after hospitalization for atypical chest discomfort and mild diastolic heart failure. She had an echocardiogram and Myoview done to evaluate this. The results are noted below. Unfortunately the echocardiogram is not very helpful as far as evaluating pulmonate pressures, but her nuclear stress test did not show any evidence of macrovascular ischemia.  Interval History:   Despite these relatively normal studies, she continues to have chest discomfort and pressure that the is difficult to explain. In the past he has undergone cardiac catheterization which failed to show coronary artery disease in the setting of an abnormal stress test;however she does have significant risk factors of diabetes hyperlipidemia and obesity, and could have progression of disease. She saw Dr. [name] at the pulmonary medicine clinic who felt that it would be worthwhile to perform a right heart catheterization to get a better read on her pulmonary pressures with her existing sarcoidosis.this is based on my offer during the last visit to consider proceeding with a right heart catheterization plus possible [facility] to get a left ventricular end-diastolic pressures and to perform coronary angiography tube once again clarify her stress test results.  When I last saw her she denies been diuresed to her previous dry weight, however she went back again to the emergency room on July 2 for chest pain.the consideration there was a very well could be due to esophageal spasm.  This was described aschest pain that started in the afternoon that were left-sided chest pressure radiating to left arm associated with diaphoresis and dyspnea this occurred arrest. She does not exert herself very much but does not know that it is still isn't made worse or better by  exerting herself. Most often the episodes occur at night when she lies down.She said that her symptoms are improved with nitroglycerin.The most notable thing she has is the dyspnea on exertion however there is chronic.  She still has orthopnea, and that is basally her baseline. Her edema seems improved. When I last large she had been diuresis of 6 more pounds and was feeling much better, but now she is back up 4pounds from then.  She still has some symptoms of that were similar to PND but not frequently.  Interestingly, when I last saw her she denied any palpitations but now notes that she does intermittently have palpitations. No prolonged irregular heartbeats, syncope/near syncope. No TIA or amaurosis fugax symptoms. No significant change to her chronic coughing, but denies significant wheezing either. She has baseline fatigue.   Past Medical History  Diagnosis Date  . DIABETES MELLITUS, TYPE II 08/21/2006  . HYPERLIPIDEMIA 08/21/2006  . GOUT 08/20/2010  . OBESITY 06/04/2009  . ANEMIA-UNSPECIFIED 09/18/2009  . HYPERTENSION 08/21/2006  . GERD 08/21/2006  . SLEEP APNEA 04/21/2009    last testing was negative  . Internal hemorrhoids   . Pulmonary sarcoidosis     Followed locally by pulmonology, but also by Dr. Donahue at [facility]  . Exertional chest pain     sharp, substernal, exertional  . Vocal cord dysfunction   . Chronic diastolic heart failure, NYHA class 2    Prior Cardiac Evaluation and Past Surgical History: Past Surgical History  Procedure Laterality Date  . Ventral hernia repair    . Nissen fundoplication  2004  . Cholecystectomy    1984  . Abdominal hysterectomy    . Knee arthroscopy      right  . Tubal ligation      with reversal in 1994  . Bladder suspension  11/11/2011    Procedure: TRANSVAGINAL TAPE (TVT) PROCEDURE;  Surgeon: Mark E Anderson, MD;  Location: [facility];  Service: Gynecology;  Laterality: N/A;  . Cystoscopy  11/11/2011    Procedure: CYSTOSCOPY;   Surgeon: Mark E Anderson, MD;  Location: [facility];  Service: Gynecology;  Laterality: N/A;  . Doppler echocardiography  02/12/2013    LV FUNCTION, SIZE NORMAL; MILD CONCENTRIC LVH; EST EF 55-65%; WALL MOTION NORMAL  . Lexiscan myoview  03/09/2013    EF 50%; NORMAL MYOCARDIAL PERFUSION STUDY - breast attenuation  . Cardiac catheterization  07/2010    LVEF 50-55% WITH VERY MILD GLOBAL HYPOKINESIA; ESSENTIALLY NORMAL CORONARY ARTERIES; NORMAL LV FUNCTION  . Carotids  02/18/11    CAROTID DUPLEX; VERTEBRALS ARE PATENT WITH ANTEGRADE FLOW. ICA/CCA RATIO 1.61 ON RIGHT AND 0.75 ON LEFT    Allergies  Allergen Reactions  . Lisinopril Cough  . Methotrexate     REACTION: peri-oral and buccal lesions.  . Clindamycin/Lincomycin Nausea And Vomiting and Rash    Current Outpatient Prescriptions  Medication Sig Dispense Refill  . albuterol (PROVENTIL HFA;VENTOLIN HFA) 108 (90 BASE) MCG/ACT inhaler Inhale 2 puffs into the lungs every 6 (six) hours as needed. For wheezing.      . albuterol (PROVENTIL) (5 MG/ML) 0.5% nebulizer solution Take 2.5 mg by nebulization every 6 (six) hours as needed. For wheezing.      . allopurinol (ZYLOPRIM) 100 MG tablet Take 100 mg by mouth at bedtime.       . aspirin EC 81 MG tablet Take 81 mg by mouth at bedtime.       . atorvastatin (LIPITOR) 40 MG tablet Take 40 mg by mouth at bedtime.      . buPROPion (WELLBUTRIN XL) 300 MG 24 hr tablet Take 300 mg by mouth every morning.       . carvedilol (COREG) 12.5 MG tablet Take 1 tablet (12.5 mg total) by mouth 2 (two) times daily with a meal.  60 tablet  3  . chlorpheniramine-HYDROcodone (TUSSIONEX) 10-8 MG/5ML LQCR Take 10 mLs by mouth every 12 (twelve) hours as needed. For cough.  240 mL  0  . cholecalciferol (VITAMIN D) 1000 UNITS tablet Take 1,000 Units by mouth 2 (two) times daily.      . ciclesonide (OMNARIS) 50 MCG/ACT nasal spray Place 1 spray into both nostrils daily.       . dexlansoprazole (DEXILANT) 60 MG capsule Take 1  capsule (60 mg total) by mouth 2 (two) times daily.  30 capsule    . diltiazem (CARDIZEM CD) 120 MG 24 hr capsule Take 1 capsule (120 mg total) by mouth every evening.  30 capsule  0  . estradiol (ESTRACE) 2 MG tablet Take 2 mg by mouth at bedtime.       . furosemide (LASIX) 40 MG tablet Take 40 mg by mouth 2 (two) times daily.      . glyBURIDE (DIABETA) 2.5 MG tablet Take 2.5 mg by mouth daily with breakfast.      . insulin glargine (LANTUS) 100 UNIT/ML injection Inject 23 Units into the skin at bedtime.      . LORazepam (ATIVAN) 1 MG tablet Take 1 mg by mouth 2 (two) times daily as needed for anxiety.      . losartan (COZAAR) 100   MG tablet Take 100 mg by mouth at bedtime.      . metFORMIN (GLUCOPHAGE) 1000 MG tablet Take 1,000 mg by mouth 2 (two) times daily with a meal.      . mometasone-formoterol (DULERA) 200-5 MCG/ACT AERO Inhale 2 puffs into the lungs 2 (two) times daily as needed. For wheezing      . nitroGLYCERIN (NITROSTAT) 0.4 MG SL tablet Place 0.4 mg under the tongue every 5 (five) minutes as needed. x3 doses as needed for chest pain.      . omeprazole (PRILOSEC) 20 MG capsule Take 20 mg by mouth at bedtime.      . potassium chloride SA (K-DUR,KLOR-CON) 20 MEQ tablet Take 30 mEq by mouth 2 (two) times daily.       . predniSONE (DELTASONE) 5 MG tablet Take 12.5 mg by mouth every morning.       . venlafaxine XR (EFFEXOR-XR) 75 MG 24 hr capsule Take 150 mg by mouth at bedtime.       . adalimumab (HUMIRA) 40 MG/0.8ML injection Inject 40 mg into the skin every 14 (fourteen) days.      . fenofibrate (TRICOR) 145 MG tablet Take 145 mg by mouth daily.       No current facility-administered medications for this visit.   actually only taking 25 mg of losartan  History   Social History  . Marital Status: Married    Spouse Name: WILLIAM Frickey    Number of Children: 2  . Years of Education: 12   Occupational History  . DISABLED    Social History Main Topics  . Smoking status:  Never Smoker   . Smokeless tobacco: Never Used  . Alcohol Use: No  . Drug Use: No  . Sexually Active: Yes   ROS: A comprehensive Review of Systems - Negative except pertinent positives as above.  PHYSICAL EXAM BP 122/70  Pulse 80  Ht 5' 6" (1.676 m)  Wt 268 lb 8 oz (121.791 kg)  BMI 43.36 kg/m2 General appearance: A&O x3, cooperative, appears stated age, no distress and morbidly obese, with very thick neck making jugular venous distention impossible to assess. Neck: no carotid bruit, supple, symmetrical, trachea midline, thyroid not enlarged, symmetric, no tenderness/mass/nodules and Unable to really assess JVD due to her body habitus was obese neck.  Lungs:CTA B. Mild tubular breath sound., normal percussion bilaterally and Nonlabored  Heart: regular rate and rhythm, S1, S2 normal, no murmur, click, rub or gallop and Unable to palpate apical impulse  Abdomen: soft, non-tender; bowel sounds normal; no masses;  Significant truncal obesity, unable to palpate HSM or determine HJR  Extremities: edema 1-2+ bilaterally, varicose veins noted and No erythema or rash. Small healing stasis ulcers  Pulses: 2+ and symmetric  EKG:Performed today: yes - NSR, 80 bpm; Q in III along. Otherwise normal ECG.  ASSESSMENT: Unfortunately we now have a difficult situation of continued admissions and evaluations for chest pain in a patient with a negative Myoview and relatively normal echocardiogram. She continues to have symptoms, and were not sure how much of this could be due to pulmonary hypertension. After discussing with Dr. [name] from pulmonary medicine, and the best plan is to go forward with right heart catheterization to investigate her pulmonary artery pressures for clarity sake. We also discussed in the importance of having an accurate method end-diastolic pressure to correlate with a wedge pressure. This would require left heart catheterization. Simply because of her frequent visits for chest pain, it  would   be prudent to prove once and for all whether this chest pain is due to macrovascular coronary disease that would be multivessel disease not noted on the Myoview stress test. Unfortunately she would never be a good cardiac pulmonary access testing to assess for possible microvascular disease. She basically has metabolic syndrome with obesity, diabetes and dyslipidemia.  PLAN: Per problem list.  Chest pain - at rest, and with exertion; based on history moderate risk for coronary artery disease. Despite normal Myoview stress test, she continues to have symptoms. With her risk factors of obesity, diabetes and dyslipidemia, think full defecation of her coronary anatomy is reasonable to confirm her current stress test as being accurate. This would serve to help future evaluation of her chest discomfort obviously ruling out coronary artery disease.  Plan:  LEFT AND RIGHT HEART CATHETERIZATION WITH CORONARY ANGIOGRAM   Chronic diastolic heart failure, NYHA class 2 Again not sure how much of her chest tightness or shortness of breathhistory Lee cardiac in nature. His is diastolic dysfunction versus old hypertension, right heart catheterization with left digital and diastolic pressure to correlate would be vital to make his final diagnoses.   Her weight is 4 pounds up from last visit. She continues to be on her standing dose of furosemide, does not note worsening edema.  LEFT AND RIGHT HEART CATHETERIZATION WITH CORONARY ANGIOGRAM   HYPERLIPIDEMIA Currently followed by her PCP. On statin and fibrate.  Preoperative examination, unspecified Preop Cath labs and studies:  Plan: Basic Metabolic Panel (BMET), CBC, INR/PT, PTT, TSH   Sarcoidosis of lung Need to determine significance of pulmonary pressures/hypertension. Per request of Dr. [name]. Plan: LEFT AND RIGHT HEART CATHETERIZATION WITH CORONARY ANGIOGRAM  HYPERTENSION Well controlled with her medications. No changes for now. We'll make  further adjustments based on the cardiac cath findings.   Followup: post catheterization.  Tyjai Matuszak W, M.D., M.S. [facility] [address] [place], [place]  [address]  [phone] Pager # [phone] 04/04/2013 6:04 PM    

## 2013-04-22 NOTE — Assessment & Plan Note (Signed)
Need to determine significance of pulmonary pressures/hypertension. Per request of Dr. Delford Field. Plan: LEFT AND RIGHT HEART CATHETERIZATION WITH CORONARY ANGIOGRAM

## 2013-04-23 ENCOUNTER — Encounter (HOSPITAL_COMMUNITY)
Admission: RE | Disposition: A | Discharge status: Home / Self Care | Payer: Self-pay | Source: Ambulatory Visit | Attending: Cardiology

## 2013-04-23 ENCOUNTER — Ambulatory Visit (HOSPITAL_COMMUNITY)
Admission: RE | Admit: 2013-04-23 | Discharge: 2013-04-23 | Disposition: A | Discharge status: Home / Self Care | Payer: BC Managed Care – PPO | Source: Ambulatory Visit | Attending: Cardiology | Admitting: Cardiology

## 2013-04-23 DIAGNOSIS — R079 Chest pain, unspecified: Secondary | ICD-10-CM

## 2013-04-23 DIAGNOSIS — E785 Hyperlipidemia, unspecified: Secondary | ICD-10-CM | POA: Insufficient documentation

## 2013-04-23 DIAGNOSIS — Z01810 Encounter for preprocedural cardiovascular examination: Secondary | ICD-10-CM

## 2013-04-23 DIAGNOSIS — I2789 Other specified pulmonary heart diseases: Secondary | ICD-10-CM | POA: Insufficient documentation

## 2013-04-23 DIAGNOSIS — Z6841 Body Mass Index (BMI) 40.0 and over, adult: Secondary | ICD-10-CM | POA: Insufficient documentation

## 2013-04-23 DIAGNOSIS — E119 Type 2 diabetes mellitus without complications: Secondary | ICD-10-CM | POA: Insufficient documentation

## 2013-04-23 DIAGNOSIS — Z888 Allergy status to other drugs, medicaments and biological substances status: Secondary | ICD-10-CM | POA: Insufficient documentation

## 2013-04-23 DIAGNOSIS — Z794 Long term (current) use of insulin: Secondary | ICD-10-CM | POA: Insufficient documentation

## 2013-04-23 DIAGNOSIS — G4730 Sleep apnea, unspecified: Secondary | ICD-10-CM | POA: Insufficient documentation

## 2013-04-23 DIAGNOSIS — Z79899 Other long term (current) drug therapy: Secondary | ICD-10-CM | POA: Insufficient documentation

## 2013-04-23 DIAGNOSIS — Z7982 Long term (current) use of aspirin: Secondary | ICD-10-CM | POA: Insufficient documentation

## 2013-04-23 DIAGNOSIS — D649 Anemia, unspecified: Secondary | ICD-10-CM | POA: Insufficient documentation

## 2013-04-23 DIAGNOSIS — IMO0002 Reserved for concepts with insufficient information to code with codable children: Secondary | ICD-10-CM | POA: Insufficient documentation

## 2013-04-23 DIAGNOSIS — I5032 Chronic diastolic (congestive) heart failure: Secondary | ICD-10-CM | POA: Insufficient documentation

## 2013-04-23 DIAGNOSIS — D869 Sarcoidosis, unspecified: Secondary | ICD-10-CM | POA: Insufficient documentation

## 2013-04-23 DIAGNOSIS — Z881 Allergy status to other antibiotic agents status: Secondary | ICD-10-CM | POA: Insufficient documentation

## 2013-04-23 DIAGNOSIS — I8390 Asymptomatic varicose veins of unspecified lower extremity: Secondary | ICD-10-CM | POA: Insufficient documentation

## 2013-04-23 DIAGNOSIS — M109 Gout, unspecified: Secondary | ICD-10-CM | POA: Insufficient documentation

## 2013-04-23 DIAGNOSIS — I509 Heart failure, unspecified: Secondary | ICD-10-CM | POA: Insufficient documentation

## 2013-04-23 DIAGNOSIS — I10 Essential (primary) hypertension: Secondary | ICD-10-CM | POA: Insufficient documentation

## 2013-04-23 DIAGNOSIS — I279 Pulmonary heart disease, unspecified: Secondary | ICD-10-CM | POA: Diagnosis not present

## 2013-04-23 DIAGNOSIS — R0789 Other chest pain: Secondary | ICD-10-CM | POA: Insufficient documentation

## 2013-04-23 DIAGNOSIS — J99 Respiratory disorders in diseases classified elsewhere: Secondary | ICD-10-CM | POA: Insufficient documentation

## 2013-04-23 HISTORY — PX: OTHER SURGICAL HISTORY: SHX169

## 2013-04-23 HISTORY — PX: LEFT AND RIGHT HEART CATHETERIZATION WITH CORONARY ANGIOGRAM: SHX5449

## 2013-04-23 LAB — GLUCOSE, CAPILLARY
Glucose-Capillary: 184 mg/dL — ABNORMAL HIGH (ref 70–99)
Glucose-Capillary: 209 mg/dL — ABNORMAL HIGH (ref 70–99)

## 2013-04-23 LAB — POCT I-STAT 3, VENOUS BLOOD GAS (G3P V)
Acid-Base Excess: 3 mmol/L — ABNORMAL HIGH (ref 0.0–2.0)
Bicarbonate: 29.2 mEq/L — ABNORMAL HIGH (ref 20.0–24.0)
O2 Saturation: 65 %
TCO2: 31 mmol/L (ref 0–100)
pCO2, Ven: 51.9 mmHg — ABNORMAL HIGH (ref 45.0–50.0)
pH, Ven: 7.358 — ABNORMAL HIGH (ref 7.250–7.300)
pO2, Ven: 36 mmHg (ref 30.0–45.0)

## 2013-04-23 LAB — POCT I-STAT 3, ART BLOOD GAS (G3+)
Acid-Base Excess: 1 mmol/L (ref 0.0–2.0)
Bicarbonate: 26.2 mEq/L — ABNORMAL HIGH (ref 20.0–24.0)
O2 Saturation: 96 %
TCO2: 28 mmol/L (ref 0–100)
pCO2 arterial: 43.7 mmHg (ref 35.0–45.0)
pH, Arterial: 7.385 (ref 7.350–7.450)
pO2, Arterial: 86 mmHg (ref 80.0–100.0)

## 2013-04-23 SURGERY — LEFT AND RIGHT HEART CATHETERIZATION WITH CORONARY ANGIOGRAM
Anesthesia: LOCAL

## 2013-04-23 MED ORDER — SODIUM CHLORIDE 0.9 % IV SOLN: 1.0000 mL/kg/h | INTRAVENOUS | Status: DC

## 2013-04-23 MED ORDER — HEPARIN SODIUM (PORCINE) 1000 UNIT/ML IJ SOLN
INTRAMUSCULAR | Status: AC
  Filled 2013-04-23: qty 1

## 2013-04-23 MED ORDER — ACETAMINOPHEN 325 MG PO TABS: 650.0000 mg | ORAL_TABLET | ORAL | Status: DC | PRN

## 2013-04-23 MED ORDER — FENTANYL CITRATE 0.05 MG/ML IJ SOLN
INTRAMUSCULAR | Status: AC
  Filled 2013-04-23: qty 2

## 2013-04-23 MED ORDER — SODIUM CHLORIDE 0.9 % IV SOLN: INTRAVENOUS | Status: DC

## 2013-04-23 MED ORDER — LIDOCAINE HCL (PF) 1 % IJ SOLN
INTRAMUSCULAR | Status: AC
  Filled 2013-04-23: qty 30

## 2013-04-23 MED ORDER — FUROSEMIDE 40 MG PO TABS: 40.0000 mg | ORAL_TABLET | Freq: Two times a day (BID) | ORAL | Status: DC

## 2013-04-23 MED ORDER — ONDANSETRON HCL 4 MG/2ML IJ SOLN: 4.0000 mg | Freq: Four times a day (QID) | INTRAMUSCULAR | Status: DC | PRN

## 2013-04-23 MED ORDER — NITROGLYCERIN 0.2 MG/ML ON CALL CATH LAB
INTRAVENOUS | Status: AC
  Filled 2013-04-23: qty 1

## 2013-04-23 MED ORDER — HEPARIN (PORCINE) IN NACL 2-0.9 UNIT/ML-% IJ SOLN
INTRAMUSCULAR | Status: AC
  Filled 2013-04-23: qty 1000

## 2013-04-23 MED ORDER — SODIUM CHLORIDE 0.9 % IJ SOLN: 3.0000 mL | Freq: Two times a day (BID) | INTRAMUSCULAR | Status: DC

## 2013-04-23 MED ORDER — VERAPAMIL HCL 2.5 MG/ML IV SOLN
INTRAVENOUS | Status: AC
  Filled 2013-04-23: qty 2

## 2013-04-23 MED ORDER — MIDAZOLAM HCL 2 MG/2ML IJ SOLN
INTRAMUSCULAR | Status: AC
  Filled 2013-04-23: qty 2

## 2013-04-23 NOTE — CV Procedure (Signed)
CARDIAC CATHETERIZTAION REPORT  NAME:  [name]   MRN: [id number] DOB:  12/14/1960   ADMIT DATE: 04/23/2013 Procedure Date: 04/23/2013  INTERVENTIONAL CARDIOLOGIST: Marykay Lex, M.D., MS PRIMARY CARE PROVIDER: [name], MD PRIMARY CARDIOLOGIST: Marykay Lex, M.D., MS  PATIENT:  [name] is a 52 y.o. female with a PMH of obesity, DM-2, HTN, HLD & diastolic HF who has had repeated evaluations for SSCP including 2 ED visits / admissions.  She has had a relatively normal Echocardiogram & a negative Myoview NST, but continues to c/o CP & DOE. Per request from her Pulmonary Medicine MD, she is referred for RHC with LHC  To better / more accurrately assess PA/RV & Left-sided filling pressures.  With her continued CP despite negative Myoview, coronary angiography will also be performed.   PRE-OPERATIVE DIAGNOSIS:    Chest Pain -- if Angina = Class III  Pulmonary Sarcoidosis - ? Pulmonary HTN  PROCEDURES PERFORMED:    LEFT AND RIGHT CARDIAC CATHETERIZATION WITH NATIVE CORONARY ANGIOGRAPY  PROCEDURE:Consent:  Risks of procedure as well as the alternatives and risks of each were explained to the (patient/caregiver).  Consent for procedure obtained. Consent for signed by MD and patient with RN witness -- placed on chart.  PROCEDURE:  The patient was brought to the 2nd Floor [facility] in the fasting state and prepped and draped in the usual sterile fashion for Right groin or radial / brachial access.  A modified Allen's test with plethysmography was performed on the right wrist demonstrating adequate Ulnar Artery collateral flow.  Using sterile technique, a 18 gauge IV was inserted in the Right antecubital vein and capped by the cath lab RN.  Sterile technique was used including antiseptics, cap, gloves, gown, hand hygiene, mask and sheet.  Skin prep: Chlorhexidine.  Time Out: Verified patient identification, verified procedure, site/side  was marked, verified correct patient position, special equipment/implants available, medications/allergies/relevent history reviewed, required imaging and test results available.  Performed  Right Heart Catheterization The Right Antecubital IV was then re-prepped.  Using an initial set of sterile gloves and towels, an 0.18 mm wire was advanced through the IV allowing for the exchange over wire for an antimicrobial bonded/coated single lumen 5Fr short sheath in the antecubital vein.  Then a 5 Fr Theone Murdoch Catheter was advanced through the sheath, and with the balloon inflated once in the SVC, was advanced under fluoroscopic guidance into the first the Right Atrium, then through the Right Ventricle into the Main Pulmonary Artery and into the Wedge position.  Hemodynamics measurements were obtained in each location. Simultaneous Oxygen saturation were recorded in both the Pulmonary Artery and the Central Aorta.  Simultaneous pressure measurements were obtained in the PA/PCWP and RV along with LV.  Thermodilution injections were not performed to calculate Cardiac Output due to the 5 Fr system.    Then the catheter was removed completely out of the body with the balloon deflated.  The Sheath was flushed with heparinized saline.  Left Heart Catheterization The right wrist was anesthetized with 1% subcutaneous Lidocaine.  The right radial artery was accessed using the Seldinger Technique with placement of a 5 Fr Glide Sheath. The sheath was aspirated and flushed.  Then a total of 10 ml of standard Radial Artery Cocktail (see medications) was infused.  Radial Cocktail: 5 mg Verapamil, 400 mcg NTG, 2 ml 2% Lidocaine in 10 ml NS.  5000 Units of IV Heparin was administered once the catheter reached  the ascending aorta.  A 5 Fr TIG Catheter was advanced of over a Versicore wire into the ascending Aorta.  This catheter was was advanced across the Aortic Valve.  LV hemodynamics were measured and Left Ventriculography  was performed.  LV hemodynamics were then re-sampled, and the catheter was pulled back across the Aortic Valve for measurement of "pull-back" gradient.  The catheter was used to engage the Left and Right coronary artery.  Multiple cineangiographic views of the Both coronary artery system(s) were performed.  An Left Ventriculogram was not performed as an echocardiogram has been ordered.  The catheter and wire were removed completely out of the body.  Both sheaths were removed in the Cath Lab with a TR band placed at 17 ml Air at 1049 hours.  Reverse Allen's test revealed non-occlusive hemostasis and manual pressure held for the venous access.  TR Band:  1035 Hours, 15 mL air Venous sheath removed with manual pressure for hemostasis.  MEDICATIONS:  Anesthesia:  Local Lidocaine 2 ml  Sedation:  2 mg IV Versed, 25 mcg IV fentanyl ;   Premedication: 2 mg PO Valium   Omnipaque Contrast: 45 ml  Anticoagulation:  IV Heparin 5000 Units   FINDINGS:  Hemodynamics:  Findings:   SaO2%  Pressures mmHg  Mean P  mmHg  EDP  mmHg   Right Atrium   11/4  6  Right Ventricle   36/4  12   Pulmonary Artery  55% 31/18  24   PCWP   15/13 12    Central Aortic  96% 127/81 100   Left Ventricle   140/16  20         Cardiac Output:   Cardiac Index:    Fick  4.9  2.19    Left Ventriculography: not done  Coronary Anatomy: No angiographically significant CAD.  Left Main: Large caliber vessel, trifurcates into LAD, Circumflex and small Ramus Intermedius. Angiographically normal. LAD: Large caliber vessel that gives off a relatively proximal Major Diagonal branch that is larger than the follow-on LAD.  Beyond the Diag, there is a large Septal Perforator Trunk and several very small diagonal and septal perforator branches distally as the vessel tapers down to the apex from a moderate to small caliber vessel.  D1: larger caliber, bifurcating vessel.  Left Circumflex: Large caliber vessel that gives off a  lateral OM1 before diving into the AV Groove.  It then bifurcates into OM2 and LPL1.  OM1: Moderate caliber vessel that almost reaches the inferolateral apex.  OM 2: small to moderate caliber vessel that has a less extensive distribution.  Ramus intermedius: Small caliber, relatively short minor branchr   RCA: Large caliber, dominant with 2 major RV marginal branches that bifurcates distally into the Right Posterior Descending Artery and Right Posterior AV Groove Branch (RPAV).  RPDA: Moderate to large caliber.  RPL Sysytem:The RPAV moderate caliber vessel that gives off a small LPL2 distally as it terminates as moderate caliber, extensive LPL1  PATIENT DISPOSITION:    The patient was transferred to the PACU holding area in a hemodynamicaly stable, chest pain free condition.  The patient tolerated the procedure well, and there were no complications.  EBL:   < 10 ml  The patient was stable before, during, and after the procedure.  POST-OPERATIVE DIAGNOSIS:    Angiographically normal coronary arteries  Mild secondary pulmonary HTN with elevated LVEDP  Mildly reduced Cardiac Output / Index  PLAN OF CARE:  Standard post radial cath care with d/c  later today.  Will titrate up lasix for nex few days to shoot for ~2 lb lower "dry wgt"  No enough BP room to push afterload reduction further.  Will reassess clinically @ OP f/u clinic.   Marykay Lex, M.D., M.S. [facility] [address] [place], [place]  [address]  [phone]  04/23/2013 3:45 PM

## 2013-04-23 NOTE — Interval H&P Note (Signed)
History and Physical Interval Note:  04/23/2013 8:31 AM  [name]  has presented today for surgery, with the diagnosis of ANGINA, HEART FAILURE  The various methods of treatment have been discussed with the patient and family. After consideration of risks, benefits and other options for treatment, the patient has consented to  Procedure(s): LEFT AND RIGHT HEART CATHETERIZATION WITH CORONARY ANGIOGRAM (N/A) as a surgical intervention .  The patient's history has been reviewed, patient examined, no change in status, stable for surgery.  I have reviewed the patient's chart and labs.  Questions were answered to the patient's satisfaction.    Cath Lab Visit (complete for each Cath Lab visit)  Clinical Evaluation Leading to the Procedure:   ACS: no  Non-ACS:    Anginal Classification: CCS III  Anti-ischemic medical therapy: Maximal Therapy (2 or more classes of medications)  Non-Invasive Test Results: Low-risk stress test findings: cardiac mortality <1%/year  Prior CABG: No previous CABG  HARDING,DAVID W

## 2013-04-23 NOTE — H&P (View-Only) (Signed)
Patient ID: TISH BEGIN, female   DOB: November 09, 1960, 52 y.o.   MRN: [id number]  Clinic Note: HPI: [name] is a 52 y.o. female with a PMH below I last saw onJune 24 after hospitalization for atypical chest discomfort and mild diastolic heart failure. She had an echocardiogram and Myoview done to evaluate this. The results are noted below. Unfortunately the echocardiogram is not very helpful as far as evaluating pulmonate pressures, but her nuclear stress test did not show any evidence of macrovascular ischemia.  Interval History:   Despite these relatively normal studies, she continues to have chest discomfort and pressure that the is difficult to explain. In the past he has undergone cardiac catheterization which failed to show coronary artery disease in the setting of an abnormal stress test;however she does have significant risk factors of diabetes hyperlipidemia and obesity, and could have progression of disease. She saw Dr. Delford Field at the pulmonary medicine clinic who felt that it would be worthwhile to perform a right heart catheterization to get a better read on her pulmonary pressures with her existing sarcoidosis.this is based on my offer during the last visit to consider proceeding with a right heart catheterization plus possible [facility] to get a left ventricular end-diastolic pressures and to perform coronary angiography tube once again clarify her stress test results.  When I last saw her she denies been diuresed to her previous dry weight, however she went back again to the emergency room on July 2 for chest pain.the consideration there was a very well could be due to esophageal spasm.  This was described aschest pain that started in the afternoon that were left-sided chest pressure radiating to left arm associated with diaphoresis and dyspnea this occurred arrest. She does not exert herself very much but does not know that it is still isn't made worse or better by  exerting herself. Most often the episodes occur at night when she lies down.She said that her symptoms are improved with nitroglycerin.The most notable thing she has is the dyspnea on exertion however there is chronic.  She still has orthopnea, and that is basally her baseline. Her edema seems improved. When I last large she had been diuresis of 6 more pounds and was feeling much better, but now she is back up 4pounds from then.  She still has some symptoms of that were similar to PND but not frequently.  Interestingly, when I last saw her she denied any palpitations but now notes that she does intermittently have palpitations. No prolonged irregular heartbeats, syncope/near syncope. No TIA or amaurosis fugax symptoms. No significant change to her chronic coughing, but denies significant wheezing either. She has baseline fatigue.   Past Medical History  Diagnosis Date  . DIABETES MELLITUS, TYPE II 08/21/2006  . HYPERLIPIDEMIA 08/21/2006  . GOUT 08/20/2010  . OBESITY 06/04/2009  . ANEMIA-UNSPECIFIED 09/18/2009  . HYPERTENSION 08/21/2006  . GERD 08/21/2006  . SLEEP APNEA 04/21/2009    last testing was negative  . Internal hemorrhoids   . Pulmonary sarcoidosis     Followed locally by pulmonology, but also by Dr. Sandy Salaam at [facility]  . Exertional chest pain     sharp, substernal, exertional  . Vocal cord dysfunction   . Chronic diastolic heart failure, NYHA class 2    Prior Cardiac Evaluation and Past Surgical History: Past Surgical History  Procedure Laterality Date  . Ventral hernia repair    . Nissen fundoplication  2004  . Cholecystectomy  1984  . Abdominal hysterectomy    . Knee arthroscopy      right  . Tubal ligation      with reversal in 1994  . Bladder suspension  11/11/2011    Procedure: TRANSVAGINAL TAPE (TVT) PROCEDURE;  Surgeon: Levi Aland, MD;  Location: [facility];  Service: Gynecology;  Laterality: N/A;  . Cystoscopy  11/11/2011    Procedure: CYSTOSCOPY;   Surgeon: Levi Aland, MD;  Location: [facility];  Service: Gynecology;  Laterality: N/A;  . Doppler echocardiography  02/12/2013    LV FUNCTION, SIZE NORMAL; MILD CONCENTRIC LVH; EST EF 55-65%; WALL MOTION NORMAL  . Lexiscan myoview  03/09/2013    EF 50%; NORMAL MYOCARDIAL PERFUSION STUDY - breast attenuation  . Cardiac catheterization  07/2010    LVEF 50-55% WITH VERY MILD GLOBAL HYPOKINESIA; ESSENTIALLY NORMAL CORONARY ARTERIES; NORMAL LV FUNCTION  . Carotids  02/18/11    CAROTID DUPLEX; VERTEBRALS ARE PATENT WITH ANTEGRADE FLOW. ICA/CCA RATIO 1.61 ON RIGHT AND 0.75 ON LEFT    Allergies  Allergen Reactions  . Lisinopril Cough  . Methotrexate     REACTION: peri-oral and buccal lesions.  . Clindamycin/Lincomycin Nausea And Vomiting and Rash    Current Outpatient Prescriptions  Medication Sig Dispense Refill  . albuterol (PROVENTIL HFA;VENTOLIN HFA) 108 (90 BASE) MCG/ACT inhaler Inhale 2 puffs into the lungs every 6 (six) hours as needed. For wheezing.      [name] albuterol (PROVENTIL) (5 MG/ML) 0.5% nebulizer solution Take 2.5 mg by nebulization every 6 (six) hours as needed. For wheezing.      [name] allopurinol (ZYLOPRIM) 100 MG tablet Take 100 mg by mouth at bedtime.       [name] aspirin EC 81 MG tablet Take 81 mg by mouth at bedtime.       [name] atorvastatin (LIPITOR) 40 MG tablet Take 40 mg by mouth at bedtime.      [name] buPROPion (WELLBUTRIN XL) 300 MG 24 hr tablet Take 300 mg by mouth every morning.       . carvedilol (COREG) 12.5 MG tablet Take 1 tablet (12.5 mg total) by mouth 2 (two) times daily with a meal.  60 tablet  3  . chlorpheniramine-HYDROcodone (TUSSIONEX) 10-8 MG/5ML LQCR Take 10 mLs by mouth every 12 (twelve) hours as needed. For cough.  240 mL  0  . cholecalciferol (VITAMIN D) 1000 UNITS tablet Take 1,000 Units by mouth 2 (two) times daily.      . ciclesonide (OMNARIS) 50 MCG/ACT nasal spray Place 1 spray into both nostrils daily.       [name] dexlansoprazole (DEXILANT) 60 MG capsule Take 1  capsule (60 mg total) by mouth 2 (two) times daily.  30 capsule    . diltiazem (CARDIZEM CD) 120 MG 24 hr capsule Take 1 capsule (120 mg total) by mouth every evening.  30 capsule  0  . estradiol (ESTRACE) 2 MG tablet Take 2 mg by mouth at bedtime.       . furosemide (LASIX) 40 MG tablet Take 40 mg by mouth 2 (two) times daily.      [name] glyBURIDE (DIABETA) 2.5 MG tablet Take 2.5 mg by mouth daily with breakfast.      . insulin glargine (LANTUS) 100 UNIT/ML injection Inject 23 Units into the skin at bedtime.      [name] LORazepam (ATIVAN) 1 MG tablet Take 1 mg by mouth 2 (two) times daily as needed for anxiety.      [name] losartan (COZAAR) 100  MG tablet Take 100 mg by mouth at bedtime.      . metFORMIN (GLUCOPHAGE) 1000 MG tablet Take 1,000 mg by mouth 2 (two) times daily with a meal.      . mometasone-formoterol (DULERA) 200-5 MCG/ACT AERO Inhale 2 puffs into the lungs 2 (two) times daily as needed. For wheezing      . nitroGLYCERIN (NITROSTAT) 0.4 MG SL tablet Place 0.4 mg under the tongue every 5 (five) minutes as needed. x3 doses as needed for chest pain.      [name] omeprazole (PRILOSEC) 20 MG capsule Take 20 mg by mouth at bedtime.      . potassium chloride SA (K-DUR,KLOR-CON) 20 MEQ tablet Take 30 mEq by mouth 2 (two) times daily.       . predniSONE (DELTASONE) 5 MG tablet Take 12.5 mg by mouth every morning.       . venlafaxine XR (EFFEXOR-XR) 75 MG 24 hr capsule Take 150 mg by mouth at bedtime.       [name] adalimumab (HUMIRA) 40 MG/0.8ML injection Inject 40 mg into the skin every 14 (fourteen) days.      . fenofibrate (TRICOR) 145 MG tablet Take 145 mg by mouth daily.       No current facility-administered medications for this visit.   actually only taking 25 mg of losartan  History   Social History  . Marital Status: Married    Spouse Name: LYNNSIE LINDERS    Number of Children: 2  . Years of Education: 12   Occupational History  . DISABLED    Social History Main Topics  . Smoking status:  Never Smoker   . Smokeless tobacco: Never Used  . Alcohol Use: No  . Drug Use: No  . Sexually Active: Yes   ROS: A comprehensive Review of Systems - Negative except pertinent positives as above.  PHYSICAL EXAM BP 122/70  Pulse 80  Ht 5\' 6"  (1.676 m)  Wt 268 lb 8 oz (121.791 kg)  BMI 43.36 kg/m2 General appearance: A&O x3, cooperative, appears stated age, no distress and morbidly obese, with very thick neck making jugular venous distention impossible to assess. Neck: no carotid bruit, supple, symmetrical, trachea midline, thyroid not enlarged, symmetric, no tenderness/mass/nodules and Unable to really assess JVD due to her body habitus was obese neck.  Lungs:CTA B. Mild tubular breath sound., normal percussion bilaterally and Nonlabored  Heart: regular rate and rhythm, S1, S2 normal, no murmur, click, rub or gallop and Unable to palpate apical impulse  Abdomen: soft, non-tender; bowel sounds normal; no masses;  Significant truncal obesity, unable to palpate HSM or determine HJR  Extremities: edema 1-2+ bilaterally, varicose veins noted and No erythema or rash. Small healing stasis ulcers  Pulses: 2+ and symmetric  [id number] today: yes - NSR, 80 bpm; Q in III along. Otherwise normal ECG.  ASSESSMENT: Unfortunately we now have a difficult situation of continued admissions and evaluations for chest pain in a patient with a negative Myoview and relatively normal echocardiogram. She continues to have symptoms, and were not sure how much of this could be due to pulmonary hypertension. After discussing with Dr. Delford Field from pulmonary medicine, and the best plan is to go forward with right heart catheterization to investigate her pulmonary artery pressures for clarity sake. We also discussed in the importance of having an accurate method end-diastolic pressure to correlate with a wedge pressure. This would require left heart catheterization. Simply because of her frequent visits for chest pain, it  would  be prudent to prove once and for all whether this chest pain is due to macrovascular coronary disease that would be multivessel disease not noted on the Myoview stress test. Unfortunately she would never be a good cardiac pulmonary access testing to assess for possible microvascular disease. She basically has metabolic syndrome with obesity, diabetes and dyslipidemia.  PLAN: Per problem list.  Chest pain - at rest, and with exertion; based on history moderate risk for coronary artery disease. Despite normal Myoview stress test, she continues to have symptoms. With her risk factors of obesity, diabetes and dyslipidemia, think full defecation of her coronary anatomy is reasonable to confirm her current stress test as being accurate. This would serve to help future evaluation of her chest discomfort obviously ruling out coronary artery disease.  Plan:  LEFT AND RIGHT HEART CATHETERIZATION WITH CORONARY ANGIOGRAM   Chronic diastolic heart failure, NYHA class 2 Again not sure how much of her chest tightness or shortness of breathhistory Lee cardiac in nature. His is diastolic dysfunction versus old hypertension, right heart catheterization with left digital and diastolic pressure to correlate would be vital to make his final diagnoses.   Her weight is 4 pounds up from last visit. She continues to be on her standing dose of furosemide, does not note worsening edema.  LEFT AND RIGHT HEART CATHETERIZATION WITH CORONARY ANGIOGRAM   HYPERLIPIDEMIA Currently followed by her PCP. On statin and fibrate.  Preoperative examination, unspecified Preop Cath labs and studies:  Plan: Basic Metabolic Panel (BMET), CBC, INR/PT, PTT, TSH   Sarcoidosis of lung Need to determine significance of pulmonary pressures/hypertension. Per request of Dr. Delford Field. Plan: LEFT AND RIGHT HEART CATHETERIZATION WITH CORONARY ANGIOGRAM  HYPERTENSION Well controlled with her medications. No changes for now. We'll make  further adjustments based on the cardiac cath findings.   Followup: post catheterization.  Marykay Lex, M.D., M.S. [facility] [address] [place], [place]  [address]  [phone] Pager # [phone] 04/04/2013 6:04 PM

## 2013-05-01 ENCOUNTER — Other Ambulatory Visit: Payer: Self-pay | Admitting: Internal Medicine

## 2013-05-02 ENCOUNTER — Ambulatory Visit (INDEPENDENT_AMBULATORY_CARE_PROVIDER_SITE_OTHER): Payer: BC Managed Care – PPO | Admitting: Cardiology

## 2013-05-02 ENCOUNTER — Encounter: Payer: Self-pay | Admitting: Cardiology

## 2013-05-02 VITALS — BP 130/72 | HR 80 | Ht 66.0 in | Wt 271.9 lb

## 2013-05-02 DIAGNOSIS — D860 Sarcoidosis of lung: Secondary | ICD-10-CM

## 2013-05-02 DIAGNOSIS — R079 Chest pain, unspecified: Secondary | ICD-10-CM | POA: Diagnosis not present

## 2013-05-02 DIAGNOSIS — I5032 Chronic diastolic (congestive) heart failure: Secondary | ICD-10-CM

## 2013-05-02 DIAGNOSIS — D869 Sarcoidosis, unspecified: Secondary | ICD-10-CM

## 2013-05-02 DIAGNOSIS — I10 Essential (primary) hypertension: Secondary | ICD-10-CM | POA: Diagnosis not present

## 2013-05-02 DIAGNOSIS — J99 Respiratory disorders in diseases classified elsewhere: Secondary | ICD-10-CM

## 2013-05-02 DIAGNOSIS — E785 Hyperlipidemia, unspecified: Secondary | ICD-10-CM | POA: Diagnosis not present

## 2013-05-02 NOTE — Patient Instructions (Addendum)
Your physician has recommended you make the following change in your medication Lasix 80 mg in morning,then 40 mg in the evening   Your physician wants you to follow-up in 6 month Dr Herbie Baltimore.  You will receive a reminder letter in the mail two months in advance. If you don't receive a letter, please call our office to schedule the follow-up appointment.

## 2013-05-14 DIAGNOSIS — I2789 Other specified pulmonary heart diseases: Secondary | ICD-10-CM | POA: Diagnosis not present

## 2013-05-14 DIAGNOSIS — I2510 Atherosclerotic heart disease of native coronary artery without angina pectoris: Secondary | ICD-10-CM | POA: Diagnosis not present

## 2013-05-14 DIAGNOSIS — D869 Sarcoidosis, unspecified: Secondary | ICD-10-CM | POA: Diagnosis not present

## 2013-05-14 DIAGNOSIS — R0789 Other chest pain: Secondary | ICD-10-CM | POA: Diagnosis not present

## 2013-05-18 ENCOUNTER — Encounter: Payer: Self-pay | Admitting: Cardiology

## 2013-05-18 NOTE — Progress Notes (Signed)
Patient ID: [name], female   DOB: [birth year], 52 y.o.   MRN: [id number]  Clinic Note: HPI: [name] is a 52 y.o. female with a PMH below who presents today for followup post Right and Left Heart Catheterization that was done to fully delineate her coronary anatomy and to evaluate her pulmonary pressures. Basically he is relatively unrevealing and her coronaries looked fine right ventricular pressures were in the low to mid 30s, wedge pressure was low teens and LVEDP was roughly 20. There is no clear cardiac etiology for her chest discomfort that she had. These findings correlate with the echocardiogram and LexiScan Myoview that were done.  Interval History: Despite the intention of her increasing her Lasix, after discharge from catheterization, this did not happen. She is now 3 pounds up from her last weight which he felt much her dry weight.  Otherwise no we'll change to her orthopnea and edema which is stable. She still has intermittent dyspnea which is her baseline. She has the same chest tightness and pressure that she's been having that began to have much to do with any activity, but not really as much as she had been having.  When she does have her chest discomfort it's intermittent heaviness again which lives at night.  She denies any palpitations or irregular heartbeats, no significant near syncope. No TIA or amaurosis fugax symptoms. No significant change to her chronic coughing, but denies significant wheezing either. She has baseline fatigue.   Past Medical History  Diagnosis Date  . DIABETES MELLITUS, TYPE II 08/21/2006  . HYPERLIPIDEMIA 08/21/2006  . GOUT 08/20/2010  . OBESITY 06/04/2009  . ANEMIA-UNSPECIFIED 09/18/2009  . HYPERTENSION 08/21/2006  . GERD 08/21/2006  . SLEEP APNEA 04/21/2009    last testing was negative  . Internal hemorrhoids   . Pulmonary sarcoidosis     Followed locally by pulmonology, but also by Dr. Sandy Salaam at [facility]  .  Exertional chest pain     sharp, substernal, exertional  . Vocal cord dysfunction   . Chronic diastolic heart failure, NYHA class 2     LVEDP roughly 20% by cath   Prior Cardiac Evaluation and Past Surgical History: Past Surgical History  Procedure Laterality Date  . Ventral hernia repair    . Nissen fundoplication  2004  . Cholecystectomy  1984  . Abdominal hysterectomy    . Knee arthroscopy      right  . Tubal ligation      with reversal in 1994  . Bladder suspension  11/11/2011    Procedure: TRANSVAGINAL TAPE (TVT) PROCEDURE;  Surgeon: Levi Aland, MD;  Location: [facility];  Service: Gynecology;  Laterality: N/A;  . Cystoscopy  11/11/2011    Procedure: CYSTOSCOPY;  Surgeon: Levi Aland, MD;  Location: [facility];  Service: Gynecology;  Laterality: N/A;  . Doppler echocardiography  02/12/2013    LV FUNCTION, SIZE NORMAL; MILD CONCENTRIC LVH; EST EF 55-65%; WALL MOTION NORMAL  . Lexiscan myoview  03/09/2013    EF 50%; NORMAL MYOCARDIAL PERFUSION STUDY - breast attenuation  . Cardiac catheterization  07/2010    LVEF 50-55% WITH VERY MILD GLOBAL HYPOKINESIA; ESSENTIALLY NORMAL CORONARY ARTERIES; NORMAL LV FUNCTION  . Carotids  02/18/11    CAROTID DUPLEX; VERTEBRALS ARE PATENT WITH ANTEGRADE FLOW. ICA/CCA RATIO 1.61 ON RIGHT AND 0.75 ON LEFT  . Right and left cardiac catheterization  04/23/2013    Angiographic normal coronaries; LVEDP 20 mmHg, PCWP 12-14 mmHg, RAP 12 mmHg.; Fick  CO/CI 4.9/2.2    Allergies  Allergen Reactions  . Lisinopril Cough  . Methotrexate     REACTION: peri-oral and buccal lesions.  . Clindamycin/Lincomycin Nausea And Vomiting and Rash    Current Outpatient Prescriptions  Medication Sig Dispense Refill  . albuterol (PROVENTIL HFA;VENTOLIN HFA) 108 (90 BASE) MCG/ACT inhaler Inhale 2 puffs into the lungs every 6 (six) hours as needed. For wheezing.      [name] albuterol (PROVENTIL) (5 MG/ML) 0.5% nebulizer solution Take 2.5 mg by nebulization every 6 (six) hours as  needed. For wheezing.      [name] allopurinol (ZYLOPRIM) 100 MG tablet Take 100 mg by mouth at bedtime.       [name] aspirin EC 81 MG tablet Take 81 mg by mouth at bedtime.       [name] atorvastatin (LIPITOR) 40 MG tablet Take 40 mg by mouth at bedtime.      [name] buPROPion (WELLBUTRIN XL) 300 MG 24 hr tablet TAKE ONE TABLET BY MOUTH ONE TIME DAILY  30 tablet  11  . carvedilol (COREG) 12.5 MG tablet Take 1 tablet (12.5 mg total) by mouth 2 (two) times daily with a meal.  60 tablet  3  . chlorpheniramine-HYDROcodone (TUSSIONEX) 10-8 MG/5ML LQCR Take 10 mLs by mouth every 12 (twelve) hours as needed. For cough.  240 mL  0  . cholecalciferol (VITAMIN D) 1000 UNITS tablet Take 1,000 Units by mouth 2 (two) times daily.      . ciclesonide (OMNARIS) 50 MCG/ACT nasal spray Place 1 spray into both nostrils daily.       [name] dexlansoprazole (DEXILANT) 60 MG capsule Take 1 capsule (60 mg total) by mouth 2 (two) times daily.  30 capsule    . diltiazem (CARDIZEM CD) 120 MG 24 hr capsule Take 1 capsule (120 mg total) by mouth every evening.  30 capsule  0  . diltiazem (CARDIZEM) 120 MG tablet Take 120 mg by mouth Nightly.      [name] estradiol (ESTRACE) 2 MG tablet Take 2 mg by mouth at bedtime.       . fenofibrate (TRICOR) 145 MG tablet Take 145 mg by mouth daily.      . furosemide (LASIX) 40 MG tablet Take 1 tablet (40 mg total) by mouth 2 (two) times daily. Take additional 40 mg in AM of 7/15 & 7/16 - to shoot for dry wgt of ~2 lb below current weight.  45 tablet  12  . glyBURIDE (DIABETA) 2.5 MG tablet Take 2.5 mg by mouth daily with breakfast.      . insulin glargine (LANTUS) 100 UNIT/ML injection Inject 23 Units into the skin at bedtime.      [name] LORazepam (ATIVAN) 1 MG tablet Take 1 mg by mouth 2 (two) times daily as needed for anxiety.      [name] losartan (COZAAR) 100 MG tablet Take 100 mg by mouth at bedtime.      . metFORMIN (GLUCOPHAGE) 1000 MG tablet Take 1,000 mg by mouth 2 (two) times daily with a meal.      .  mometasone-formoterol (DULERA) 200-5 MCG/ACT AERO Inhale 2 puffs into the lungs 2 (two) times daily as needed. For wheezing      . nitroGLYCERIN (NITROSTAT) 0.4 MG SL tablet Place 0.4 mg under the tongue every 5 (five) minutes as needed. x3 doses as needed for chest pain.      [name] omeprazole (PRILOSEC) 20 MG capsule Take 20 mg by mouth at bedtime.      [name]  potassium chloride SA (K-DUR,KLOR-CON) 20 MEQ tablet Take 30 mEq by mouth 2 (two) times daily.       . predniSONE (DELTASONE) 5 MG tablet Take 12.5 mg by mouth every morning.       . venlafaxine XR (EFFEXOR-XR) 75 MG 24 hr capsule Take 150 mg by mouth at bedtime.       [name] adalimumab (HUMIRA) 40 MG/0.8ML injection Inject 40 mg into the skin every 14 (fourteen) days.       No current facility-administered medications for this visit.   actually only taking 25 mg of losartan  History   Social History  . Marital Status: Married    Spouse Name: KATHELINE BRENDLINGER    Number of Children: 2  . Years of Education: 12   Occupational History  . DISABLED    Social History Main Topics  . Smoking status: Never Smoker   . Smokeless tobacco: Never Used  . Alcohol Use: No  . Drug Use: No  . Sexually Active: Yes   Other Topics Concern  . Not on file   Social History Narrative  . No narrative on file   ROS: A comprehensive Review of Systems - Negative except pertinent positives as above.  PHYSICAL EXAM BP 130/72  Pulse 80  Ht 5\' 6"  (1.676 m)  Wt 271 lb 14.4 oz (123.333 kg)  BMI 43.91 kg/m2 General appearance: alert, cooperative, appears stated age, no distress and morbidly obese  Neck: no carotid bruit, supple, symmetrical, trachea midline, thyroid not enlarged, symmetric, no tenderness/mass/nodules and Unable to really assess JVD due to her body habitus was obese neck.  Lungs: clear to auscultation bilaterally, normal percussion bilaterally and Nonlabored  Heart: regular rate and rhythm, S1, S2 normal, no murmur, click, rub or gallop and Unable  to palpate apical impulse  Abdomen: soft, non-tender; bowel sounds normal; no masses, no organomegaly and Significant truncal obesity, unable to palpate HSM or determine HJR  Extremities: edema 1-2+ bilaterally, varicose veins noted and No erythema or rash. Small healing stasis ulcers  Pulses: 2+ and symmetric  [id number] today: No  ASSESSMENT: Deciliter invasive evaluation metered her noninvasive evaluation. There is no clear cardiac etiology for symptoms. Her dyspnea is clearly multifactorial, partly related to basilar lung disease with sarcoidosis, as well as obesity Possible obesity ventilation syndrome. She seems to feel better when she has been adequately diuresed and close her dry weight. Based on the cath results, I want her to increase her Lasix some with the potential for sliding scale Lasix as described last visit.  Chronic diastolic heart failure, NYHA class 2  Chest pain - at rest, and with exertion; -- angiographically normal coronary arteries, mildly elevated LVEDP  HYPERTENSION  HYPERLIPIDEMIA  Sarcoidosis of lung  PLAN: Per problem list.  Followup: 6 months for reassessment.  Marykay Lex, M.D., M.S. [facility] [address] [place], [place]  [address]  [phone] Pager # [phone] 04/04/2013 7:21 PM

## 2013-05-18 NOTE — Assessment & Plan Note (Signed)
Perhaps her chest discomfort could be due to diastolic dysfunction with endocardial ischemia, however is more likely than not, is probably muscular skeletal or intrinsic mediastinal pain. I don't think aggressive angina management will help.

## 2013-05-18 NOTE — Assessment & Plan Note (Signed)
With LVEDP of roughly 20, I think we need to increase her diuresis him. Plan was to increase Lasix to 80 mg in morning and 40 in the afternoon. W The afternoon dose can be adjusted based on the sliding scale with her morning weights. The AV if she is up more than 3 pounds, 40 if not. If she is down and hold the afternoon dose. Why she is on carvedilol at a good dose as well as diltiazem along with fullness ARB.

## 2013-05-18 NOTE — Assessment & Plan Note (Signed)
Blood pressure is good today apparently. She is taking carvedilol 12.5 mg twice a day, diltiazem 100 mg daily, and losartan 100 mg daily her heart rate is well-controlled which helps for myocardial relaxation time.

## 2013-05-18 NOTE — Assessment & Plan Note (Signed)
Monitored by her primary physician. She is on Lipitor plus TriCor.

## 2013-05-18 NOTE — Assessment & Plan Note (Signed)
Essentially mild pulmonary attention by right heart catheterization. Perhaps of mediastinal lymph nodes could be causing some discomfort in her chest.

## 2013-05-19 ENCOUNTER — Other Ambulatory Visit: Payer: Self-pay | Admitting: Internal Medicine

## 2013-06-04 ENCOUNTER — Other Ambulatory Visit: Payer: Self-pay | Admitting: *Deleted

## 2013-06-04 MED ORDER — DILTIAZEM HCL ER COATED BEADS 120 MG PO CP24: 120.0000 mg | ORAL_CAPSULE | Freq: Every evening | ORAL | Status: DC

## 2013-06-11 ENCOUNTER — Other Ambulatory Visit: Payer: Self-pay | Admitting: Internal Medicine

## 2013-06-12 ENCOUNTER — Ambulatory Visit (INDEPENDENT_AMBULATORY_CARE_PROVIDER_SITE_OTHER): Payer: BC Managed Care – PPO | Admitting: Internal Medicine

## 2013-06-12 ENCOUNTER — Encounter: Payer: Self-pay | Admitting: Internal Medicine

## 2013-06-12 VITALS — BP 162/94 | HR 104 | Temp 100.5°F | Wt 268.0 lb

## 2013-06-12 DIAGNOSIS — E669 Obesity, unspecified: Secondary | ICD-10-CM

## 2013-06-12 DIAGNOSIS — IMO0002 Reserved for concepts with insufficient information to code with codable children: Secondary | ICD-10-CM

## 2013-06-12 DIAGNOSIS — Z23 Encounter for immunization: Secondary | ICD-10-CM | POA: Diagnosis not present

## 2013-06-12 DIAGNOSIS — I5033 Acute on chronic diastolic (congestive) heart failure: Secondary | ICD-10-CM

## 2013-06-12 DIAGNOSIS — E1142 Type 2 diabetes mellitus with diabetic polyneuropathy: Secondary | ICD-10-CM

## 2013-06-12 DIAGNOSIS — E1165 Type 2 diabetes mellitus with hyperglycemia: Secondary | ICD-10-CM

## 2013-06-12 DIAGNOSIS — I509 Heart failure, unspecified: Secondary | ICD-10-CM

## 2013-06-12 DIAGNOSIS — E1149 Type 2 diabetes mellitus with other diabetic neurological complication: Secondary | ICD-10-CM

## 2013-06-12 DIAGNOSIS — E1159 Type 2 diabetes mellitus with other circulatory complications: Secondary | ICD-10-CM

## 2013-06-12 NOTE — Progress Notes (Signed)
Dm- home cbgs 120-130. Last a1c was within the past 6 months Sarcoid- has regular f/u with pulmonary (sounds like she has pfts today) htn- no sxs on meds Lipids: done in April 2014- note elevated TG. curently on lipitor. Lipid Panel     Component Value Date/Time   CHOL 166 01/16/2013 0938   TRIG 489.0 Triglyceride is over 400; calculations on Lipids are invalid.* 01/16/2013 0938   HDL 36.90* 01/16/2013 0938   CHOLHDL 4 01/16/2013 0938   VLDL 97.8* 01/16/2013 0938   LDLCALC UNABLE TO CALCULATE IF TRIGLYCERIDE OVER 400 mg/dL 1/61/0960 4540    Reviewed pmh, psh, sochx, meds Ros- chronic cough and sob. No other complaints  Reviewed vitals obese female in no acute distress. HEENT exam atraumatic, normocephalic, extraocular muscles are intact. Neck is supple. No jugular venous distention no thyromegaly. Chest clear to auscultation without increased work of breathing. Cardiac exam S1 and S2 are regular. Abdominal exam active bowel sounds, soft, nontender. Extremities no edema. Neurologic exam she is alert without any motor sensory deficits. Gait is normal.

## 2013-06-13 ENCOUNTER — Ambulatory Visit (INDEPENDENT_AMBULATORY_CARE_PROVIDER_SITE_OTHER): Payer: BC Managed Care – PPO | Admitting: Critical Care Medicine

## 2013-06-13 DIAGNOSIS — R05 Cough: Secondary | ICD-10-CM

## 2013-06-13 DIAGNOSIS — R053 Chronic cough: Secondary | ICD-10-CM

## 2013-06-13 DIAGNOSIS — R059 Cough, unspecified: Secondary | ICD-10-CM | POA: Diagnosis not present

## 2013-06-13 DIAGNOSIS — J99 Respiratory disorders in diseases classified elsewhere: Secondary | ICD-10-CM

## 2013-06-13 DIAGNOSIS — D860 Sarcoidosis of lung: Secondary | ICD-10-CM

## 2013-06-13 DIAGNOSIS — D869 Sarcoidosis, unspecified: Secondary | ICD-10-CM

## 2013-06-13 NOTE — Progress Notes (Signed)
PFT done today. 

## 2013-06-15 ENCOUNTER — Encounter: Payer: Self-pay | Admitting: Critical Care Medicine

## 2013-06-15 ENCOUNTER — Ambulatory Visit (INDEPENDENT_AMBULATORY_CARE_PROVIDER_SITE_OTHER): Payer: BC Managed Care – PPO | Admitting: Cardiology

## 2013-06-15 ENCOUNTER — Encounter: Payer: Self-pay | Admitting: Cardiology

## 2013-06-15 ENCOUNTER — Ambulatory Visit (INDEPENDENT_AMBULATORY_CARE_PROVIDER_SITE_OTHER): Payer: BC Managed Care – PPO | Admitting: Critical Care Medicine

## 2013-06-15 VITALS — BP 152/86 | HR 104 | Ht 66.0 in | Wt 266.2 lb

## 2013-06-15 VITALS — BP 140/78 | HR 107 | Temp 97.8°F | Ht 66.0 in | Wt 264.0 lb

## 2013-06-15 DIAGNOSIS — J99 Respiratory disorders in diseases classified elsewhere: Secondary | ICD-10-CM | POA: Diagnosis not present

## 2013-06-15 DIAGNOSIS — D869 Sarcoidosis, unspecified: Secondary | ICD-10-CM

## 2013-06-15 DIAGNOSIS — Z0389 Encounter for observation for other suspected diseases and conditions ruled out: Secondary | ICD-10-CM

## 2013-06-15 DIAGNOSIS — E669 Obesity, unspecified: Secondary | ICD-10-CM | POA: Diagnosis not present

## 2013-06-15 DIAGNOSIS — R609 Edema, unspecified: Secondary | ICD-10-CM

## 2013-06-15 DIAGNOSIS — E1165 Type 2 diabetes mellitus with hyperglycemia: Secondary | ICD-10-CM

## 2013-06-15 DIAGNOSIS — D860 Sarcoidosis of lung: Secondary | ICD-10-CM

## 2013-06-15 DIAGNOSIS — IMO0002 Reserved for concepts with insufficient information to code with codable children: Secondary | ICD-10-CM

## 2013-06-15 DIAGNOSIS — I10 Essential (primary) hypertension: Secondary | ICD-10-CM

## 2013-06-15 DIAGNOSIS — M791 Myalgia, unspecified site: Secondary | ICD-10-CM

## 2013-06-15 DIAGNOSIS — IMO0001 Reserved for inherently not codable concepts without codable children: Secondary | ICD-10-CM

## 2013-06-15 DIAGNOSIS — E1149 Type 2 diabetes mellitus with other diabetic neurological complication: Secondary | ICD-10-CM

## 2013-06-15 DIAGNOSIS — E1142 Type 2 diabetes mellitus with diabetic polyneuropathy: Secondary | ICD-10-CM

## 2013-06-15 MED ORDER — MOMETASONE FURO-FORMOTEROL FUM 200-5 MCG/ACT IN AERO: 2.0000 | INHALATION_SPRAY | Freq: Two times a day (BID) | RESPIRATORY_TRACT | Status: DC

## 2013-06-15 NOTE — Patient Instructions (Addendum)
No change in medications. Use Dulera twice daily Return in        4 months

## 2013-06-15 NOTE — Progress Notes (Signed)
Subjective:    Patient ID: [name], female    DOB: 03/14/1961, 52 y.o.   MRN: [id number]  HPI  52 y.o.  with Sarcoid, GERD, VCD, cyclical cough  06/15/2013 Chief Complaint  Patient presents with  . Follow-up    with PFTs.  Breathing unchanged.  Has DOE, a little wheezing, dry cough, and swelling in legs.   Pt had RHC, no PULM HTN seen PFTs stable compared to 10/2011 Notes min cough.  No real wheeze.  Sl chest pain  Dyspnea with exertion, unchanged   Review of Systems  Constitutional:   No  weight loss, night sweats,  Fevers, chills, fatigue, or  lassitude.  HEENT:   No headaches,  Difficulty swallowing,  Tooth/dental problems, or  Sore throat,                No sneezing, itching, ear ache,  +nasal congestion, post nasal drip,   CV:  No chest pain,  Orthopnea, PND, swelling in lower extremities, anasarca, dizziness, palpitations, syncope.   GI  No heartburn, indigestion, abdominal pain, nausea, vomiting, diarrhea, change in bowel habits, loss of appetite, bloody stools.   Resp:  ,  No coughing up of blood.   [name]  No chest wall deformity  Skin: no rash or lesions.  GU: no dysuria, change in color of urine, no urgency or frequency.  No flank pain, no hematuria   MS:  No joint pain or swelling.  No decreased range of motion.  No back pain.  Psych:  No change in mood or affect. No depression or anxiety.  No memory loss.     Objective:   Physical Exam BP 140/78  Pulse 107  Temp(Src) 97.8 F (36.6 C) (Oral)  Ht 5\' 6"  (1.676 m)  Wt 264 lb (119.75 kg)  BMI 42.63 kg/m2  SpO2 98%  GEN: A/Ox3; pleasant , NAD , obese  HEENT:  [place]/AT,  EACs-clear, TMs-wnl, NOSE-clear drainage THROAT-clear, no lesions, no postnasal drip or exudate noted.   NECK:  Supple w/ fair ROM; no JVD; normal carotid impulses w/o bruits; no thyromegaly or nodules palpated; no lymphadenopathy.  RESP  Coarse BS  w/o, wheezes/ rales/ or rhonchi.no accessory muscle use, no dullness to  percussion  CARD:  RRR, no m/r/g  , no peripheral edema, pulses intact, no cyanosis or clubbing.  GI:   Soft & nt; nml bowel sounds; no organomegaly or masses detected.  Musco: Warm bil, no deformities or joint swelling noted.   Neuro: alert, no focal deficits noted.    Skin: Warm, no lesions or rashes  Pulmonary functions reviewed from 06/13/2013: FEV1 68% predicted FVC 61% predicted FEV1 to FEC ratio 87% predicted total lung capacity 69% predicted diffusion capacity 72% predicted Overall these areas are essentially unchanged from January 2013    Assessment & Plan:   Sarcoidosis of lung Sarcoidosis of lung with no evidence of pulmonary hypertension and stable pulmonary function studies Plan Maintain prednisone at current dose level Maintain Dulera Administer flu vac    Updated Medication List Outpatient Encounter Prescriptions as of 06/15/2013  Medication Sig Dispense Refill  . albuterol (PROVENTIL HFA;VENTOLIN HFA) 108 (90 BASE) MCG/ACT inhaler Inhale 2 puffs into the lungs every 6 (six) hours as needed. For wheezing.      [name] albuterol (PROVENTIL) (5 MG/ML) 0.5% nebulizer solution Take 2.5 mg by nebulization every 6 (six) hours as needed. For wheezing.      [name] allopurinol (ZYLOPRIM) 100 MG tablet Take 100 mg by mouth  at bedtime.       [name] aspirin EC 81 MG tablet Take 81 mg by mouth at bedtime.       [name] atenolol (TENORMIN) 50 MG tablet Take 50 mg by mouth. Take 50 mg by mouth. Frequency:QD   Dosage:50   MG  Instructions:  Note:Dose: 50MG       . atorvastatin (LIPITOR) 40 MG tablet Take 40 mg by mouth at bedtime.      [name] buPROPion (WELLBUTRIN XL) 300 MG 24 hr tablet TAKE ONE TABLET BY MOUTH ONE TIME DAILY  30 tablet  11  . carvedilol (COREG) 12.5 MG tablet Take 1 tablet (12.5 mg total) by mouth 2 (two) times daily with a meal.  60 tablet  3  . chlorpheniramine-HYDROcodone (TUSSIONEX) 10-8 MG/5ML LQCR Take 10 mLs by mouth every 12 (twelve) hours as needed. For cough.  240 mL  0  .  cholecalciferol (VITAMIN D) 1000 UNITS tablet Take 1,000 Units by mouth 2 (two) times daily.      . ciclesonide (OMNARIS) 50 MCG/ACT nasal spray Place 1 spray into both nostrils daily.       . cloNIDine (CATAPRES) 0.1 MG tablet Take 0.1 mg by mouth. Take 0.1 mg by mouth. Frequency:QD   Dosage:0.1   MG  Instructions:  Note:Dose: 0.1MG       . dexlansoprazole (DEXILANT) 60 MG capsule Take 1 capsule (60 mg total) by mouth 2 (two) times daily.  30 capsule    . diltiazem (CARDIZEM CD) 120 MG 24 hr capsule Take 1 capsule (120 mg total) by mouth every evening.  30 capsule  11  . estradiol (ESTRACE) 2 MG tablet Take 2 mg by mouth at bedtime.       . fenofibrate (TRICOR) 145 MG tablet Take 145 mg by mouth daily.      . furosemide (LASIX) 40 MG tablet Take 1 tablet (40 mg total) by mouth 2 (two) times daily. Take additional 40 mg in AM of 7/15 & 7/16 - to shoot for dry wgt of ~2 lb below current weight.  45 tablet  12  . glyBURIDE (DIABETA) 2.5 MG tablet Take one tablet by mouth daily with breakfast  30 tablet  3  . insulin glargine (LANTUS) 100 UNIT/ML injection Inject 23 Units into the skin at bedtime.      [name] LORazepam (ATIVAN) 1 MG tablet Take 1 mg by mouth 2 (two) times daily as needed for anxiety.      [name] losartan (COZAAR) 100 MG tablet Take 100 mg by mouth at bedtime.      . metFORMIN (GLUCOPHAGE) 1000 MG tablet Take 1,000 mg by mouth 2 (two) times daily with a meal.      . mometasone-formoterol (DULERA) 200-5 MCG/ACT AERO Inhale 2 puffs into the lungs 2 (two) times daily.  1 Inhaler  6  . nitroGLYCERIN (NITROSTAT) 0.4 MG SL tablet Place 0.4 mg under the tongue every 5 (five) minutes as needed. x3 doses as needed for chest pain.      [name] omeprazole (PRILOSEC) 20 MG capsule Take one capsule by mouth one time daily  90 capsule  1  . potassium chloride SA (K-DUR,KLOR-CON) 20 MEQ tablet Take 30 mEq by mouth 2 (two) times daily.       . predniSONE (DELTASONE) 5 MG tablet Take 12.5 mg by mouth every morning.        . venlafaxine XR (EFFEXOR-XR) 75 MG 24 hr capsule Take 150 mg by mouth at bedtime.       . [  DISCONTINUED] mometasone-formoterol (DULERA) 200-5 MCG/ACT AERO Inhale 2 puffs into the lungs 2 (two) times daily as needed. For wheezing       No facility-administered encounter medications on file as of 06/15/2013.

## 2013-06-15 NOTE — Progress Notes (Signed)
06/15/2013 [name]   10/09/1961  [id number]  Primary Physicia [name], MD Primary Cardiologist: Dr Herbie Baltimore  HPI:  52 y/o morbidly obese female seen by [place] in the past for diastolic CHF. She has documented normal coronaries in Oct 2011. She just saw Dr Herbie Baltimore in July. She came to the office today to be seen for edema and leg pain prior to going to [facility] for vacation. She says she has noted leg edema bilat. She had at worst trace edema today in the office. She also complained about bilateral leg pain from the thighs down for the past few days. She denies any back pain or any history of back trouble.    Current Outpatient Prescriptions  Medication Sig Dispense Refill  . albuterol (PROVENTIL HFA;VENTOLIN HFA) 108 (90 BASE) MCG/ACT inhaler Inhale 2 puffs into the lungs every 6 (six) hours as needed. For wheezing.      [name] albuterol (PROVENTIL) (5 MG/ML) 0.5% nebulizer solution Take 2.5 mg by nebulization every 6 (six) hours as needed. For wheezing.      [name] allopurinol (ZYLOPRIM) 100 MG tablet Take 100 mg by mouth at bedtime.       [name] aspirin EC 81 MG tablet Take 81 mg by mouth at bedtime.       [name] atenolol (TENORMIN) 50 MG tablet Take 50 mg by mouth. Take 50 mg by mouth. Frequency:QD   Dosage:50   MG  Instructions:  Note:Dose: 50MG       . atorvastatin (LIPITOR) 40 MG tablet Take 40 mg by mouth at bedtime.      [name] buPROPion (WELLBUTRIN XL) 300 MG 24 hr tablet TAKE ONE TABLET BY MOUTH ONE TIME DAILY  30 tablet  11  . carvedilol (COREG) 12.5 MG tablet Take 1 tablet (12.5 mg total) by mouth 2 (two) times daily with a meal.  60 tablet  3  . chlorpheniramine-HYDROcodone (TUSSIONEX) 10-8 MG/5ML LQCR Take 10 mLs by mouth every 12 (twelve) hours as needed. For cough.  240 mL  0  . cholecalciferol (VITAMIN D) 1000 UNITS tablet Take 1,000 Units by mouth 2 (two) times daily.      . ciclesonide (OMNARIS) 50 MCG/ACT nasal spray Place 1 spray into both nostrils daily.       . cloNIDine  (CATAPRES) 0.1 MG tablet Take 0.1 mg by mouth. Take 0.1 mg by mouth. Frequency:QD   Dosage:0.1   MG  Instructions:  Note:Dose: 0.1MG       . dexlansoprazole (DEXILANT) 60 MG capsule Take 1 capsule (60 mg total) by mouth 2 (two) times daily.  30 capsule    . diltiazem (CARDIZEM CD) 120 MG 24 hr capsule Take 1 capsule (120 mg total) by mouth every evening.  30 capsule  11  . estradiol (ESTRACE) 2 MG tablet Take 2 mg by mouth at bedtime.       . fenofibrate (TRICOR) 145 MG tablet Take 145 mg by mouth daily.      . furosemide (LASIX) 40 MG tablet Take 1 tablet (40 mg total) by mouth 2 (two) times daily. Take additional 40 mg in AM of 7/15 & 7/16 - to shoot for dry wgt of ~2 lb below current weight.  45 tablet  12  . glyBURIDE (DIABETA) 2.5 MG tablet Take one tablet by mouth daily with breakfast  30 tablet  3  . insulin glargine (LANTUS) 100 UNIT/ML injection Inject 23 Units into the skin at bedtime.      [name] LORazepam (ATIVAN) 1 MG  tablet Take 1 mg by mouth 2 (two) times daily as needed for anxiety.      [name] losartan (COZAAR) 100 MG tablet Take 100 mg by mouth at bedtime.      . metFORMIN (GLUCOPHAGE) 1000 MG tablet Take 1,000 mg by mouth 2 (two) times daily with a meal.      . mometasone-formoterol (DULERA) 200-5 MCG/ACT AERO Inhale 2 puffs into the lungs 2 (two) times daily.  1 Inhaler  6  . nitroGLYCERIN (NITROSTAT) 0.4 MG SL tablet Place 0.4 mg under the tongue every 5 (five) minutes as needed. x3 doses as needed for chest pain.      [name] omeprazole (PRILOSEC) 20 MG capsule Take one capsule by mouth one time daily  90 capsule  1  . potassium chloride SA (K-DUR,KLOR-CON) 20 MEQ tablet Take 30 mEq by mouth 2 (two) times daily.       . predniSONE (DELTASONE) 5 MG tablet Take 12.5 mg by mouth every morning.       . venlafaxine XR (EFFEXOR-XR) 75 MG 24 hr capsule Take 150 mg by mouth at bedtime.        No current facility-administered medications for this visit.    Allergies  Allergen Reactions  .  Lisinopril Cough  . Methotrexate     REACTION: peri-oral and buccal lesions.  . Clindamycin/Lincomycin Nausea And Vomiting and Rash    History   Social History  . Marital Status: Married    Spouse Name: ADILEN PAVELKO    Number of Children: 2  . Years of Education: 12   Occupational History  . DISABLED    Social History Main Topics  . Smoking status: Never Smoker   . Smokeless tobacco: Never Used  . Alcohol Use: No  . Drug Use: No  . Sexual Activity: Yes   Other Topics Concern  . Not on file   Social History Narrative  . No narrative on file     Review of Systems: General: negative for chills, fever, night sweats or weight changes.  Cardiovascular: negative for chest pain, dyspnea on exertion, edema, orthopnea, palpitations, paroxysmal nocturnal dyspnea or shortness of breath Dermatological: negative for rash Respiratory: negative for cough or wheezing Urologic: negative for hematuria Abdominal: negative for nausea, vomiting, diarrhea, bright red blood per rectum, melena, or hematemesis Neurologic: negative for visual changes, syncope, or dizziness All other systems reviewed and are otherwise negative except as noted above.    Blood pressure 152/86, pulse 104, height 5\' 6"  (1.676 m), weight 266 lb 3.2 oz (120.748 kg).  General appearance: alert, cooperative, no distress and morbidly obese Lungs: clear to auscultation bilaterally Heart: regular rate and rhythm Extremities: trace edema   ASSESSMENT AND PLAN:   Edema She came to the office today complaining of lower extremity edema (no significant edema on exam)  Myalgia She is complaining of bilat leg pain from her thighs down for the last week.  OBESITY She says negative sleep study in the past  HYPERTENSION Controlled  Type II diabetes mellitus with neurological manifestations, uncontrolled .  Sarcoidosis of lung .  Normal coronary arteries- 10/11 .   PLAN  I reassured her that her edema was  not significant. I am concerned she my have statin induced myalgias and have asked her to stop her Lipitor for now to see if this gets better. She will follow up with me in a couple of weeks. I also noted that she is on both Tenormin and Coreg and I suggested she  stop her Tenormin.  [facility] 06/15/2013 4:34 PM

## 2013-06-15 NOTE — Assessment & Plan Note (Signed)
She says negative sleep study in the past

## 2013-06-15 NOTE — Assessment & Plan Note (Signed)
She is complaining of bilat leg pain from her thighs down for the last week.

## 2013-06-15 NOTE — Assessment & Plan Note (Addendum)
Sarcoidosis of lung with no evidence of pulmonary hypertension and stable pulmonary function studies Plan Maintain prednisone at current dose level Maintain Dulera Administer flu vac

## 2013-06-15 NOTE — Patient Instructions (Addendum)
Your physician recommends that you schedule a follow-up appointment in 2 weeks with [facility].   STOP Atenolol. Stop Lipitor for now. Avoid salt or excessive fluid intake.

## 2013-06-15 NOTE — Assessment & Plan Note (Addendum)
She came to the office today complaining of lower extremity edema (no significant edema on exam)

## 2013-06-15 NOTE — Assessment & Plan Note (Signed)
Controlled.  

## 2013-06-16 NOTE — Assessment & Plan Note (Signed)
This is one of her main problems She should exercise daily

## 2013-06-16 NOTE — Assessment & Plan Note (Signed)
Lab Results  Component Value Date   HGBA1C 7.2* 03/06/2013   Will check again at next OV

## 2013-06-16 NOTE — Assessment & Plan Note (Signed)
sxs are stable Continue same labs

## 2013-06-29 ENCOUNTER — Ambulatory Visit: Payer: BC Managed Care – PPO | Admitting: Cardiology

## 2013-07-03 DIAGNOSIS — D869 Sarcoidosis, unspecified: Secondary | ICD-10-CM | POA: Diagnosis not present

## 2013-07-03 DIAGNOSIS — R079 Chest pain, unspecified: Secondary | ICD-10-CM | POA: Diagnosis not present

## 2013-07-03 DIAGNOSIS — R918 Other nonspecific abnormal finding of lung field: Secondary | ICD-10-CM | POA: Diagnosis not present

## 2013-07-03 DIAGNOSIS — IMO0002 Reserved for concepts with insufficient information to code with codable children: Secondary | ICD-10-CM | POA: Diagnosis not present

## 2013-07-09 ENCOUNTER — Ambulatory Visit (INDEPENDENT_AMBULATORY_CARE_PROVIDER_SITE_OTHER): Payer: BC Managed Care – PPO | Admitting: Family Medicine

## 2013-07-09 ENCOUNTER — Encounter: Payer: Self-pay | Admitting: Family Medicine

## 2013-07-09 VITALS — BP 148/72 | HR 116 | Temp 98.7°F | Wt 258.0 lb

## 2013-07-09 DIAGNOSIS — J019 Acute sinusitis, unspecified: Secondary | ICD-10-CM

## 2013-07-09 MED ORDER — CIPROFLOXACIN HCL 500 MG PO TABS: 500.0000 mg | ORAL_TABLET | Freq: Two times a day (BID) | ORAL | Status: DC

## 2013-07-09 NOTE — Progress Notes (Signed)
  Subjective:    Patient ID: [name], female    DOB: 11/06/1960, 52 y.o.   MRN: [id number]  HPI Here for one week of sinus pressure, PND, HA, and a ST. No fever or cough. She also mentions 3 days of increased urge to urinate. No burning.    Review of Systems  Constitutional: Negative.   HENT: Positive for congestion, postnasal drip and sinus pressure.   Eyes: Negative.   Respiratory: Negative.   Genitourinary: Positive for urgency and frequency.       Objective:   Physical Exam  Constitutional: She appears well-developed and well-nourished.  HENT:  Right Ear: External ear normal.  Left Ear: External ear normal.  Nose: Nose normal.  Mouth/Throat: Oropharynx is clear and moist.  Eyes: Conjunctivae are normal.  Pulmonary/Chest: Effort normal and breath sounds normal.  Lymphadenopathy:    She has no cervical adenopathy.          Assessment & Plan:  We will treat the sinusitis with Cipro, and this should cover any early UTI as well. Add Mucinex and drink plenty of water

## 2013-07-10 ENCOUNTER — Other Ambulatory Visit: Payer: Self-pay | Admitting: Internal Medicine

## 2013-07-24 ENCOUNTER — Other Ambulatory Visit (INDEPENDENT_AMBULATORY_CARE_PROVIDER_SITE_OTHER): Payer: BC Managed Care – PPO

## 2013-07-24 DIAGNOSIS — E785 Hyperlipidemia, unspecified: Secondary | ICD-10-CM

## 2013-07-24 DIAGNOSIS — E1159 Type 2 diabetes mellitus with other circulatory complications: Secondary | ICD-10-CM | POA: Diagnosis not present

## 2013-07-24 LAB — BASIC METABOLIC PANEL
BUN: 12 mg/dL (ref 6–23)
CO2: 28 mEq/L (ref 19–32)
Calcium: 9 mg/dL (ref 8.4–10.5)
Chloride: 99 mEq/L (ref 96–112)
Creatinine, Ser: 0.7 mg/dL (ref 0.4–1.2)
GFR: 99.2 mL/min (ref >60.00)
Glucose, Bld: 149 mg/dL — ABNORMAL HIGH (ref 70–99)
Potassium: 4 mEq/L (ref 3.5–5.1)
Sodium: 141 mEq/L (ref 135–145)

## 2013-07-24 LAB — LIPID PANEL
Cholesterol: 155 mg/dL (ref 0–200)
HDL: 39.9 mg/dL (ref >39.00)
Total CHOL/HDL Ratio: 4
Triglycerides: 611 mg/dL — ABNORMAL HIGH (ref 0.0–149.0)
VLDL: 122.2 mg/dL — ABNORMAL HIGH (ref 0.0–40.0)

## 2013-07-24 LAB — TSH: TSH: 3.36 u[IU]/mL (ref 0.35–5.50)

## 2013-07-24 LAB — HEPATIC FUNCTION PANEL
ALT: 17 U/L (ref 0–35)
AST: 26 U/L (ref 0–37)
Albumin: 3.7 g/dL (ref 3.5–5.2)
Alkaline Phosphatase: 65 U/L (ref 39–117)
Bilirubin, Direct: 0.2 mg/dL (ref 0.0–0.3)
Total Bilirubin: 0.8 mg/dL (ref 0.3–1.2)
Total Protein: 6.7 g/dL (ref 6.0–8.3)

## 2013-07-24 LAB — MICROALBUMIN / CREATININE URINE RATIO
Creatinine,U: 149.7 mg/dL
Microalb Creat Ratio: 0.1 mg/g (ref 0.0–30.0)
Microalb, Ur: 0.2 mg/dL (ref 0.0–1.9)

## 2013-07-24 LAB — LDL CHOLESTEROL, DIRECT: Direct LDL: 51.6 mg/dL

## 2013-07-24 LAB — CBC
HCT: 35 % — ABNORMAL LOW (ref 36.0–46.0)
Hemoglobin: 11.6 g/dL — ABNORMAL LOW (ref 12.0–15.0)
MCHC: 33.3 g/dL (ref 30.0–36.0)
MCV: 89.6 fl (ref 78.0–100.0)
Platelets: 344 10*3/uL (ref 150.0–400.0)
RBC: 3.9 Mil/uL (ref 3.87–5.11)
RDW: 13.8 % (ref 11.5–14.6)
WBC: 11 10*3/uL — ABNORMAL HIGH (ref 4.5–10.5)

## 2013-07-24 LAB — HEMOGLOBIN A1C: Hgb A1c MFr Bld: 7.9 % — ABNORMAL HIGH (ref 4.6–6.5)

## 2013-07-25 ENCOUNTER — Other Ambulatory Visit: Payer: BC Managed Care – PPO

## 2013-07-27 ENCOUNTER — Telehealth: Payer: Self-pay | Admitting: Internal Medicine

## 2013-07-27 NOTE — Telephone Encounter (Signed)
Pt would like results of labs.done 10/15

## 2013-07-30 NOTE — Telephone Encounter (Signed)
hgba1c has gone up

## 2013-08-01 ENCOUNTER — Telehealth: Payer: Self-pay | Admitting: Internal Medicine

## 2013-08-01 NOTE — Telephone Encounter (Signed)
Pt states that for the past couple of days she has abdominal pain and diarrhea every time she eats. Pt states she has tried Imodium but it has not helped. Pt requests to be seen. Pt scheduled to see Amy Esterwood PA 08/03/13@2pm . Pt aware of appt.

## 2013-08-03 ENCOUNTER — Ambulatory Visit: Payer: BC Managed Care – PPO | Admitting: Physician Assistant

## 2013-08-27 ENCOUNTER — Other Ambulatory Visit: Payer: Self-pay | Admitting: Internal Medicine

## 2013-08-27 ENCOUNTER — Other Ambulatory Visit: Payer: Self-pay | Admitting: Critical Care Medicine

## 2013-09-13 ENCOUNTER — Other Ambulatory Visit: Payer: Self-pay | Admitting: Internal Medicine

## 2013-09-17 DIAGNOSIS — J99 Respiratory disorders in diseases classified elsewhere: Secondary | ICD-10-CM | POA: Diagnosis present

## 2013-09-17 DIAGNOSIS — I5032 Chronic diastolic (congestive) heart failure: Secondary | ICD-10-CM | POA: Diagnosis present

## 2013-09-17 DIAGNOSIS — I10 Essential (primary) hypertension: Secondary | ICD-10-CM | POA: Diagnosis present

## 2013-09-17 DIAGNOSIS — J209 Acute bronchitis, unspecified: Principal | ICD-10-CM | POA: Diagnosis present

## 2013-09-17 DIAGNOSIS — Z79899 Other long term (current) drug therapy: Secondary | ICD-10-CM

## 2013-09-17 DIAGNOSIS — Z7982 Long term (current) use of aspirin: Secondary | ICD-10-CM

## 2013-09-17 DIAGNOSIS — E785 Hyperlipidemia, unspecified: Secondary | ICD-10-CM | POA: Diagnosis present

## 2013-09-17 DIAGNOSIS — E1149 Type 2 diabetes mellitus with other diabetic neurological complication: Secondary | ICD-10-CM | POA: Diagnosis present

## 2013-09-17 DIAGNOSIS — J440 Chronic obstructive pulmonary disease with acute lower respiratory infection: Principal | ICD-10-CM | POA: Diagnosis present

## 2013-09-17 DIAGNOSIS — Z794 Long term (current) use of insulin: Secondary | ICD-10-CM

## 2013-09-17 DIAGNOSIS — E669 Obesity, unspecified: Secondary | ICD-10-CM | POA: Diagnosis present

## 2013-09-17 DIAGNOSIS — E1142 Type 2 diabetes mellitus with diabetic polyneuropathy: Secondary | ICD-10-CM | POA: Diagnosis present

## 2013-09-17 DIAGNOSIS — D869 Sarcoidosis, unspecified: Secondary | ICD-10-CM | POA: Diagnosis present

## 2013-09-17 DIAGNOSIS — K219 Gastro-esophageal reflux disease without esophagitis: Secondary | ICD-10-CM | POA: Diagnosis present

## 2013-09-17 DIAGNOSIS — T380X5A Adverse effect of glucocorticoids and synthetic analogues, initial encounter: Secondary | ICD-10-CM | POA: Diagnosis present

## 2013-09-17 DIAGNOSIS — M109 Gout, unspecified: Secondary | ICD-10-CM | POA: Diagnosis present

## 2013-09-17 DIAGNOSIS — Z6841 Body Mass Index (BMI) 40.0 and over, adult: Secondary | ICD-10-CM

## 2013-09-18 ENCOUNTER — Emergency Department (HOSPITAL_COMMUNITY): Payer: BC Managed Care – PPO

## 2013-09-18 ENCOUNTER — Encounter (HOSPITAL_COMMUNITY): Payer: Self-pay | Admitting: Emergency Medicine

## 2013-09-18 ENCOUNTER — Inpatient Hospital Stay (HOSPITAL_COMMUNITY)
Admission: EM | Admit: 2013-09-18 | Discharge: 2013-09-20 | DRG: 191 | Disposition: A | Discharge status: Home / Self Care | Payer: BC Managed Care – PPO | Attending: Critical Care Medicine | Admitting: Critical Care Medicine

## 2013-09-18 DIAGNOSIS — R05 Cough: Secondary | ICD-10-CM

## 2013-09-18 DIAGNOSIS — J99 Respiratory disorders in diseases classified elsewhere: Secondary | ICD-10-CM

## 2013-09-18 DIAGNOSIS — J449 Chronic obstructive pulmonary disease, unspecified: Secondary | ICD-10-CM | POA: Diagnosis present

## 2013-09-18 DIAGNOSIS — J441 Chronic obstructive pulmonary disease with (acute) exacerbation: Secondary | ICD-10-CM | POA: Insufficient documentation

## 2013-09-18 DIAGNOSIS — R053 Chronic cough: Secondary | ICD-10-CM

## 2013-09-18 DIAGNOSIS — E1149 Type 2 diabetes mellitus with other diabetic neurological complication: Secondary | ICD-10-CM

## 2013-09-18 DIAGNOSIS — E1165 Type 2 diabetes mellitus with hyperglycemia: Secondary | ICD-10-CM

## 2013-09-18 DIAGNOSIS — J209 Acute bronchitis, unspecified: Secondary | ICD-10-CM

## 2013-09-18 DIAGNOSIS — K219 Gastro-esophageal reflux disease without esophagitis: Secondary | ICD-10-CM

## 2013-09-18 DIAGNOSIS — IMO0002 Reserved for concepts with insufficient information to code with codable children: Secondary | ICD-10-CM

## 2013-09-18 DIAGNOSIS — I152 Hypertension secondary to endocrine disorders: Secondary | ICD-10-CM | POA: Diagnosis present

## 2013-09-18 DIAGNOSIS — J42 Unspecified chronic bronchitis: Secondary | ICD-10-CM | POA: Diagnosis present

## 2013-09-18 DIAGNOSIS — E1159 Type 2 diabetes mellitus with other circulatory complications: Secondary | ICD-10-CM | POA: Diagnosis present

## 2013-09-18 DIAGNOSIS — I5032 Chronic diastolic (congestive) heart failure: Secondary | ICD-10-CM

## 2013-09-18 DIAGNOSIS — D869 Sarcoidosis, unspecified: Secondary | ICD-10-CM

## 2013-09-18 DIAGNOSIS — R059 Cough, unspecified: Secondary | ICD-10-CM

## 2013-09-18 DIAGNOSIS — I10 Essential (primary) hypertension: Secondary | ICD-10-CM

## 2013-09-18 DIAGNOSIS — D860 Sarcoidosis of lung: Secondary | ICD-10-CM

## 2013-09-18 DIAGNOSIS — E1129 Type 2 diabetes mellitus with other diabetic kidney complication: Secondary | ICD-10-CM | POA: Diagnosis present

## 2013-09-18 DIAGNOSIS — E1142 Type 2 diabetes mellitus with diabetic polyneuropathy: Secondary | ICD-10-CM

## 2013-09-18 LAB — CBC WITH DIFFERENTIAL/PLATELET
Basophils Absolute: 0 10*3/uL (ref 0.0–0.1)
Basophils Relative: 0 % (ref 0–1)
Eosinophils Absolute: 0.3 10*3/uL (ref 0.0–0.7)
Eosinophils Relative: 1 % (ref 0–5)
HCT: 35.2 % — ABNORMAL LOW (ref 36.0–46.0)
Hemoglobin: 11.9 g/dL — ABNORMAL LOW (ref 12.0–15.0)
Lymphocytes Relative: 29 % (ref 12–46)
Lymphs Abs: 5 10*3/uL — ABNORMAL HIGH (ref 0.7–4.0)
MCH: 29.7 pg (ref 26.0–34.0)
MCHC: 33.8 g/dL (ref 30.0–36.0)
MCV: 87.8 fL (ref 78.0–100.0)
Monocytes Absolute: 1 10*3/uL (ref 0.1–1.0)
Monocytes Relative: 5 % (ref 3–12)
Neutro Abs: 11.2 10*3/uL — ABNORMAL HIGH (ref 1.7–7.7)
Neutrophils Relative %: 64 % (ref 43–77)
Platelets: 427 10*3/uL — ABNORMAL HIGH (ref 150–400)
RBC: 4.01 MIL/uL (ref 3.87–5.11)
RDW: 13.7 % (ref 11.5–15.5)
WBC: 17.5 10*3/uL — ABNORMAL HIGH (ref 4.0–10.5)

## 2013-09-18 LAB — POCT I-STAT, CHEM 8
BUN: 12 mg/dL (ref 6–23)
Calcium, Ion: 1.06 mmol/L — ABNORMAL LOW (ref 1.12–1.23)
Chloride: 96 mEq/L (ref 96–112)
Creatinine, Ser: 1 mg/dL (ref 0.50–1.10)
Glucose, Bld: 273 mg/dL — ABNORMAL HIGH (ref 70–99)
HCT: 38 % (ref 36.0–46.0)
Hemoglobin: 12.9 g/dL (ref 12.0–15.0)
Potassium: 3.4 mEq/L — ABNORMAL LOW (ref 3.5–5.1)
Sodium: 137 mEq/L (ref 135–145)
TCO2: 24 mmol/L (ref 0–100)

## 2013-09-18 LAB — GLUCOSE, CAPILLARY
Glucose-Capillary: 238 mg/dL — ABNORMAL HIGH (ref 70–99)
Glucose-Capillary: 345 mg/dL — ABNORMAL HIGH (ref 70–99)
Glucose-Capillary: 249 mg/dL — ABNORMAL HIGH (ref 70–99)
Glucose-Capillary: 334 mg/dL — ABNORMAL HIGH (ref 70–99)
Glucose-Capillary: 366 mg/dL — ABNORMAL HIGH (ref 70–99)

## 2013-09-18 LAB — POCT I-STAT TROPONIN I: Troponin i, poc: 0 ng/mL (ref 0.00–0.08)

## 2013-09-18 LAB — MRSA PCR SCREENING: MRSA by PCR: NEGATIVE

## 2013-09-18 LAB — PRO B NATRIURETIC PEPTIDE: Pro B Natriuretic peptide (BNP): 93.8 pg/mL (ref 0–125)

## 2013-09-18 MED ORDER — METHYLPREDNISOLONE SODIUM SUCC 125 MG IJ SOLR
60.0000 mg | Freq: Four times a day (QID) | INTRAMUSCULAR | Status: DC
  Administered 2013-09-18 (×2): 60 mg via INTRAVENOUS
  Filled 2013-09-18 (×5): qty 0.96

## 2013-09-18 MED ORDER — LEVOFLOXACIN IN D5W 750 MG/150ML IV SOLN
750.0000 mg | Freq: Once | INTRAVENOUS | Status: AC
  Administered 2013-09-18: 750 mg via INTRAVENOUS
  Filled 2013-09-18: qty 150

## 2013-09-18 MED ORDER — DILTIAZEM HCL ER COATED BEADS 120 MG PO CP24
120.0000 mg | ORAL_CAPSULE | Freq: Every evening | ORAL | Status: DC
Start: 2013-09-18 — End: 2013-09-20
  Administered 2013-09-18 – 2013-09-19 (×2): 120 mg via ORAL
  Filled 2013-09-18 (×3): qty 1

## 2013-09-18 MED ORDER — LEVOFLOXACIN IN D5W 750 MG/150ML IV SOLN
750.0000 mg | INTRAVENOUS | Status: DC
  Administered 2013-09-18: 750 mg via INTRAVENOUS
  Filled 2013-09-18 (×3): qty 150

## 2013-09-18 MED ORDER — INSULIN ASPART 100 UNIT/ML ~~LOC~~ SOLN
8.0000 [IU] | Freq: Three times a day (TID) | SUBCUTANEOUS | Status: DC
  Administered 2013-09-18 – 2013-09-19 (×2): 8 [IU] via SUBCUTANEOUS

## 2013-09-18 MED ORDER — INSULIN GLARGINE 100 UNIT/ML ~~LOC~~ SOLN
30.0000 [IU] | Freq: Every day | SUBCUTANEOUS | Status: DC
  Administered 2013-09-18: 30 [IU] via SUBCUTANEOUS
  Filled 2013-09-18 (×3): qty 0.3

## 2013-09-18 MED ORDER — METHYLPREDNISOLONE SODIUM SUCC 125 MG IJ SOLR
125.0000 mg | Freq: Once | INTRAMUSCULAR | Status: AC
  Administered 2013-09-18: 125 mg via INTRAVENOUS
  Filled 2013-09-18: qty 2

## 2013-09-18 MED ORDER — FENTANYL CITRATE 0.05 MG/ML IJ SOLN
50.0000 ug | INTRAMUSCULAR | Status: DC | PRN
  Administered 2013-09-18: 50 ug via INTRAVENOUS
  Filled 2013-09-18: qty 2

## 2013-09-18 MED ORDER — ALBUTEROL SULFATE (5 MG/ML) 0.5% IN NEBU
5.0000 mg | INHALATION_SOLUTION | Freq: Once | RESPIRATORY_TRACT | Status: AC
  Administered 2013-09-18: 5 mg via RESPIRATORY_TRACT

## 2013-09-18 MED ORDER — VENLAFAXINE HCL ER 75 MG PO CP24
75.0000 mg | ORAL_CAPSULE | Freq: Every day | ORAL | Status: DC
  Administered 2013-09-18 – 2013-09-19 (×2): 75 mg via ORAL
  Filled 2013-09-18 (×6): qty 1

## 2013-09-18 MED ORDER — ACETAMINOPHEN 325 MG PO TABS
650.0000 mg | ORAL_TABLET | Freq: Four times a day (QID) | ORAL | Status: DC | PRN
  Administered 2013-09-18 (×2): 650 mg via ORAL
  Filled 2013-09-18 (×3): qty 2

## 2013-09-18 MED ORDER — INSULIN GLARGINE 100 UNIT/ML ~~LOC~~ SOLN
23.0000 [IU] | Freq: Every day | SUBCUTANEOUS | Status: DC
  Filled 2013-09-18: qty 0.23

## 2013-09-18 MED ORDER — ONDANSETRON HCL 4 MG/2ML IJ SOLN
4.0000 mg | Freq: Once | INTRAMUSCULAR | Status: AC
  Administered 2013-09-18: 4 mg via INTRAVENOUS
  Filled 2013-09-18: qty 2

## 2013-09-18 MED ORDER — ALLOPURINOL 100 MG PO TABS
100.0000 mg | ORAL_TABLET | Freq: Every day | ORAL | Status: DC
  Administered 2013-09-18 – 2013-09-19 (×2): 100 mg via ORAL
  Filled 2013-09-18 (×4): qty 1

## 2013-09-18 MED ORDER — FUROSEMIDE 40 MG PO TABS
40.0000 mg | ORAL_TABLET | Freq: Two times a day (BID) | ORAL | Status: DC
  Administered 2013-09-18 – 2013-09-20 (×5): 40 mg via ORAL
  Filled 2013-09-18 (×8): qty 1

## 2013-09-18 MED ORDER — LOSARTAN POTASSIUM 50 MG PO TABS
100.0000 mg | ORAL_TABLET | Freq: Every day | ORAL | Status: DC
  Administered 2013-09-18 – 2013-09-19 (×2): 100 mg via ORAL
  Filled 2013-09-18 (×4): qty 2

## 2013-09-18 MED ORDER — SODIUM CHLORIDE 0.9 % IJ SOLN
3.0000 mL | INTRAMUSCULAR | Status: DC | PRN
  Administered 2013-09-19: 3 mL via INTRAVENOUS

## 2013-09-18 MED ORDER — VITAMIN D3 25 MCG (1000 UNIT) PO TABS
1000.0000 [IU] | ORAL_TABLET | Freq: Two times a day (BID) | ORAL | Status: DC
  Administered 2013-09-18 – 2013-09-19 (×4): 1000 [IU] via ORAL
  Filled 2013-09-18 (×7): qty 1

## 2013-09-18 MED ORDER — HYDROCOD POLST-CHLORPHEN POLST 10-8 MG/5ML PO LQCR
5.0000 mL | Freq: Two times a day (BID) | ORAL | Status: DC
  Administered 2013-09-18 – 2013-09-19 (×5): 5 mL via ORAL
  Filled 2013-09-18 (×5): qty 5

## 2013-09-18 MED ORDER — ALBUTEROL SULFATE (5 MG/ML) 0.5% IN NEBU
2.5000 mg | INHALATION_SOLUTION | RESPIRATORY_TRACT | Status: DC
  Administered 2013-09-18 – 2013-09-20 (×11): 2.5 mg via RESPIRATORY_TRACT
  Filled 2013-09-18 (×11): qty 0.5

## 2013-09-18 MED ORDER — ASPIRIN EC 81 MG PO TBEC
81.0000 mg | DELAYED_RELEASE_TABLET | Freq: Every day | ORAL | Status: DC
  Administered 2013-09-18 – 2013-09-19 (×2): 81 mg via ORAL
  Filled 2013-09-18 (×4): qty 1

## 2013-09-18 MED ORDER — SODIUM CHLORIDE 0.9 % IV SOLN: 250.0000 mL | INTRAVENOUS | Status: DC | PRN

## 2013-09-18 MED ORDER — PANTOPRAZOLE SODIUM 40 MG IV SOLR
40.0000 mg | Freq: Two times a day (BID) | INTRAVENOUS | Status: DC
  Administered 2013-09-18 (×3): 40 mg via INTRAVENOUS
  Filled 2013-09-18 (×7): qty 40

## 2013-09-18 MED ORDER — IPRATROPIUM BROMIDE 0.02 % IN SOLN
0.5000 mg | Freq: Four times a day (QID) | RESPIRATORY_TRACT | Status: DC
  Administered 2013-09-18 – 2013-09-19 (×8): 0.5 mg via RESPIRATORY_TRACT
  Filled 2013-09-18 (×8): qty 2.5

## 2013-09-18 MED ORDER — INSULIN ASPART 100 UNIT/ML ~~LOC~~ SOLN
6.0000 [IU] | Freq: Three times a day (TID) | SUBCUTANEOUS | Status: DC
  Administered 2013-09-18 (×2): 6 [IU] via SUBCUTANEOUS

## 2013-09-18 MED ORDER — INSULIN ASPART 100 UNIT/ML ~~LOC~~ SOLN
0.0000 [IU] | Freq: Three times a day (TID) | SUBCUTANEOUS | Status: DC
  Administered 2013-09-18: 15 [IU] via SUBCUTANEOUS
  Administered 2013-09-18: 7 [IU] via SUBCUTANEOUS
  Administered 2013-09-18: 20 [IU] via SUBCUTANEOUS
  Administered 2013-09-19: 15 [IU] via SUBCUTANEOUS
  Administered 2013-09-19: 11 [IU] via SUBCUTANEOUS
  Administered 2013-09-19: 7 [IU] via SUBCUTANEOUS
  Administered 2013-09-20: 3 [IU] via SUBCUTANEOUS

## 2013-09-18 MED ORDER — CARVEDILOL 12.5 MG PO TABS
12.5000 mg | ORAL_TABLET | Freq: Two times a day (BID) | ORAL | Status: DC
  Administered 2013-09-18 – 2013-09-20 (×5): 12.5 mg via ORAL
  Filled 2013-09-18 (×8): qty 1

## 2013-09-18 MED ORDER — BENZONATATE 100 MG PO CAPS
200.0000 mg | ORAL_CAPSULE | Freq: Three times a day (TID) | ORAL | Status: DC
  Administered 2013-09-18 – 2013-09-19 (×6): 200 mg via ORAL
  Filled 2013-09-18 (×10): qty 2

## 2013-09-18 MED ORDER — ATORVASTATIN CALCIUM 40 MG PO TABS
40.0000 mg | ORAL_TABLET | Freq: Every day | ORAL | Status: DC
  Administered 2013-09-18 – 2013-09-19 (×2): 40 mg via ORAL
  Filled 2013-09-18 (×4): qty 1

## 2013-09-18 MED ORDER — LORAZEPAM 1 MG PO TABS: 1.0000 mg | ORAL_TABLET | Freq: Two times a day (BID) | ORAL | Status: DC | PRN

## 2013-09-18 MED ORDER — CLONIDINE HCL 0.1 MG PO TABS
0.1000 mg | ORAL_TABLET | Freq: Every day | ORAL | Status: DC
  Administered 2013-09-18 – 2013-09-19 (×2): 0.1 mg via ORAL
  Filled 2013-09-18 (×3): qty 1

## 2013-09-18 MED ORDER — METHYLPREDNISOLONE SODIUM SUCC 40 MG IJ SOLR
40.0000 mg | Freq: Three times a day (TID) | INTRAMUSCULAR | Status: DC
  Administered 2013-09-18 – 2013-09-19 (×2): 40 mg via INTRAVENOUS
  Filled 2013-09-18 (×7): qty 1

## 2013-09-18 MED ORDER — ALBUTEROL SULFATE (5 MG/ML) 0.5% IN NEBU
5.0000 mg | INHALATION_SOLUTION | Freq: Once | RESPIRATORY_TRACT | Status: AC
  Administered 2013-09-18: 5 mg via RESPIRATORY_TRACT
  Filled 2013-09-18: qty 1

## 2013-09-18 MED ORDER — HEPARIN SODIUM (PORCINE) 5000 UNIT/ML IJ SOLN
5000.0000 [IU] | Freq: Three times a day (TID) | INTRAMUSCULAR | Status: DC
  Administered 2013-09-18 – 2013-09-20 (×7): 5000 [IU] via SUBCUTANEOUS
  Filled 2013-09-18 (×12): qty 1

## 2013-09-18 MED ORDER — BUPROPION HCL ER (XL) 300 MG PO TB24
300.0000 mg | ORAL_TABLET | Freq: Every day | ORAL | Status: DC
  Administered 2013-09-18 – 2013-09-19 (×2): 300 mg via ORAL
  Filled 2013-09-18 (×3): qty 1

## 2013-09-18 MED ORDER — FENOFIBRATE 160 MG PO TABS
160.0000 mg | ORAL_TABLET | Freq: Every day | ORAL | Status: DC
  Administered 2013-09-18 – 2013-09-19 (×2): 160 mg via ORAL
  Filled 2013-09-18 (×3): qty 1

## 2013-09-18 MED ORDER — ESTRADIOL 2 MG PO TABS
2.0000 mg | ORAL_TABLET | Freq: Every day | ORAL | Status: DC
  Administered 2013-09-18 – 2013-09-19 (×2): 2 mg via ORAL
  Filled 2013-09-18 (×4): qty 1

## 2013-09-18 MED ORDER — SODIUM CHLORIDE 0.9 % IJ SOLN
3.0000 mL | Freq: Two times a day (BID) | INTRAMUSCULAR | Status: DC
  Administered 2013-09-18 – 2013-09-19 (×3): 3 mL via INTRAVENOUS

## 2013-09-18 NOTE — H&P (Signed)
PULMONARY  / CRITICAL CARE MEDICINE  Name: [name] MRN: [id number] DOB: 1961/05/21    ADMISSION DATE:  09/18/2013   REFERRING MD :  EDP PRIMARY SERVICE:  PCCM  CHIEF COMPLAINT:   Acute shortness of breath and cough  BRIEF PATIENT DESCRIPTION:  52 year old female with severe reflux disease and sarcoidosis admitted with acute cyclical cough  SIGNIFICANT EVENTS / STUDIES:     LINES / TUBES: PIV  CULTURES: None  ANTIBIOTICS: Levaquin 09/18/2013  HISTORY OF PRESENT ILLNESS:   This is a 52 year old white female well-known to my service with history of pulmonary sarcoidosis and high level reflux disease with cyclical cough. The patient's had a two-day history of increasing cough that is dry. There is increasing chest discomfort and the patient comes emergent department with acute dyspnea and is admitted for further inpatient care  Past history compatible with diastolic heart failure, diabetes, sarcoidosis, lower airway obstruction   PAST MEDICAL HISTORY :  Past Medical History  Diagnosis Date  . DIABETES MELLITUS, TYPE II 08/21/2006  . HYPERLIPIDEMIA 08/21/2006  . GOUT 08/20/2010  . OBESITY 06/04/2009  . ANEMIA-UNSPECIFIED 09/18/2009  . HYPERTENSION 08/21/2006  . GERD 08/21/2006  . SLEEP APNEA 04/21/2009    last testing was negative  . Internal hemorrhoids   . Pulmonary sarcoidosis     Followed locally by pulmonology, but also by Dr. Sandy Salaam at [facility]  . Exertional chest pain     sharp, substernal, exertional  . Vocal cord dysfunction   . Chronic diastolic heart failure, NYHA class 2     LVEDP roughly 20% by cath   Past Surgical History  Procedure Laterality Date  . Ventral hernia repair    . Nissen fundoplication  2004  . Cholecystectomy  1984  . Abdominal hysterectomy    . Knee arthroscopy      right  . Tubal ligation      with reversal in 1994  . Bladder suspension  11/11/2011    Procedure: TRANSVAGINAL TAPE (TVT) PROCEDURE;   Surgeon: Levi Aland, MD;  Location: [facility];  Service: Gynecology;  Laterality: N/A;  . Cystoscopy  11/11/2011    Procedure: CYSTOSCOPY;  Surgeon: Levi Aland, MD;  Location: [facility];  Service: Gynecology;  Laterality: N/A;  . Doppler echocardiography  02/12/2013    LV FUNCTION, SIZE NORMAL; MILD CONCENTRIC LVH; EST EF 55-65%; WALL MOTION NORMAL  . Lexiscan myoview  03/09/2013    EF 50%; NORMAL MYOCARDIAL PERFUSION STUDY - breast attenuation  . Cardiac catheterization  07/2010    LVEF 50-55% WITH VERY MILD GLOBAL HYPOKINESIA; ESSENTIALLY NORMAL CORONARY ARTERIES; NORMAL LV FUNCTION  . Carotids  02/18/11    CAROTID DUPLEX; VERTEBRALS ARE PATENT WITH ANTEGRADE FLOW. ICA/CCA RATIO 1.61 ON RIGHT AND 0.75 ON LEFT  . Right and left cardiac catheterization  04/23/2013    Angiographic normal coronaries; LVEDP 20 mmHg, PCWP 12-14 mmHg, RAP 12 mmHg.; Fick CO/CI 4.9/2.2   Prior to Admission medications   Medication Sig Start Date End Date Taking? Authorizing Provider  albuterol (PROVENTIL HFA;VENTOLIN HFA) 108 (90 BASE) MCG/ACT inhaler Inhale 2 puffs into the lungs every 6 (six) hours as needed. For wheezing.    Historical Provider, MD  albuterol (PROVENTIL) (5 MG/ML) 0.5% nebulizer solution Take 2.5 mg by nebulization every 6 (six) hours as needed. For wheezing.    Historical Provider, MD  allopurinol (ZYLOPRIM) 100 MG tablet Take 100 mg by mouth at bedtime.     Historical Provider, MD  aspirin  EC 81 MG tablet Take 81 mg by mouth at bedtime.     Historical Provider, MD  atenolol (TENORMIN) 50 MG tablet Take 50 mg by mouth. Take 50 mg by mouth. Frequency:QD   Dosage:50   MG  Instructions:  Note:Dose: 50MG  10/26/11   Historical Provider, MD  atorvastatin (LIPITOR) 40 MG tablet Take 40 mg by mouth at bedtime.    Historical Provider, MD  B-D ULTRAFINE III SHORT PEN 31G X 8 MM MISC Follow directions provided by physician 09/13/13   Lindley Magnus, MD  carvedilol (COREG) 12.5 MG tablet Take 1 tablet (12.5 mg  total) by mouth 2 (two) times daily with a meal. 03/12/13   Doe-Hyun R Artist Pais, DO  chlorpheniramine-HYDROcodone (TUSSIONEX) 10-8 MG/5ML LQCR Take 10 mLs by mouth every 12 (twelve) hours as needed. For cough. 03/29/13   Gordy Savers, MD  cholecalciferol (VITAMIN D) 1000 UNITS tablet Take 1,000 Units by mouth 2 (two) times daily.    Historical Provider, MD  ciclesonide (OMNARIS) 50 MCG/ACT nasal spray Place 1 spray into both nostrils daily.     Historical Provider, MD  cloNIDine (CATAPRES) 0.1 MG tablet Take 0.1 mg by mouth. Take 0.1 mg by mouth. Frequency:QD   Dosage:0.1   MG  Instructions:  Note:Dose: 0.1MG  10/26/11   Historical Provider, MD  DEXILANT 60 MG capsule Take one capsule by mouth one time daily 08/27/13   Storm Frisk, MD  diltiazem (CARDIZEM CD) 120 MG 24 hr capsule Take 1 capsule (120 mg total) by mouth every evening. 06/04/13   Marykay Lex, MD  estradiol (ESTRACE) 2 MG tablet Take 2 mg by mouth at bedtime.     Historical Provider, MD  fenofibrate (TRICOR) 145 MG tablet Take 145 mg by mouth daily.    Historical Provider, MD  furosemide (LASIX) 40 MG tablet Take 1 tablet (40 mg total) by mouth 2 (two) times daily. Take additional 40 mg in AM of 7/15 & 7/16 - to shoot for dry wgt of ~2 lb below current weight. 04/23/13   Marykay Lex, MD  glyBURIDE (DIABETA) 2.5 MG tablet Take one tablet by mouth daily with breakfast 05/19/13   Lindley Magnus, MD  LORazepam (ATIVAN) 1 MG tablet Take 1 mg by mouth 2 (two) times daily as needed for anxiety.    Historical Provider, MD  losartan (COZAAR) 100 MG tablet Take 100 mg by mouth at bedtime. 03/06/13   Marykay Lex, MD  metFORMIN (GLUCOPHAGE) 1000 MG tablet TAKE ONE TABLET TWICE DAILY WITH MEALS  07/10/13   Lindley Magnus, MD  mometasone-formoterol (DULERA) 200-5 MCG/ACT AERO Inhale 2 puffs into the lungs 2 (two) times daily. 06/15/13   Storm Frisk, MD  nitroGLYCERIN (NITROSTAT) 0.4 MG SL tablet Place 0.4 mg under the tongue every 5 (five)  minutes as needed. x3 doses as needed for chest pain.    Historical Provider, MD  omeprazole (PRILOSEC) 20 MG capsule Take one capsule by mouth one time daily 06/11/13   Hilarie Fredrickson, MD  potassium chloride SA (K-DUR,KLOR-CON) 20 MEQ tablet Take one tablet by mouth three times daily 08/27/13   Lindley Magnus, MD  predniSONE (DELTASONE) 5 MG tablet Take 10 mg by mouth every morning.     Historical Provider, MD  venlafaxine XR (EFFEXOR-XR) 75 MG 24 hr capsule Take one capsule by mouth twice daily 08/27/13   Lindley Magnus, MD   Allergies  Allergen Reactions  . Lisinopril Cough  . Methotrexate  REACTION: peri-oral and buccal lesions.  . Clindamycin/Lincomycin Nausea And Vomiting and Rash    FAMILY HISTORY:  Family History  Problem Relation Age of Onset  . Diabetes Father   . Heart attack Father   . Coronary artery disease Father   . Heart failure Father   . COPD Mother   . Emphysema Mother   . Asthma Mother   . Heart failure Mother   . Sarcoidosis Maternal Uncle   . Colon cancer Neg Hx   . Lung cancer Brother   . Cancer Brother   . Diabetes Brother   . Heart attack Maternal Grandfather    SOCIAL HISTORY:  reports that she has never smoked. She has never used smokeless tobacco. She reports that she does not drink alcohol or use illicit drugs.  REVIEW OF SYSTEMS:   Constitutional: Negative for fever, chills, weight loss, malaise/fatigue and diaphoresis.  HENT: Negative for hearing loss, ear pain, nosebleeds, congestion, sore throat, neck pain, tinnitus and ear discharge.   Eyes: Negative for blurred vision, double vision, photophobia, pain, discharge and redness.  Respiratory: Negative for cough, hemoptysis, sputum production, shortness of breath, wheezing and stridor.   Cardiovascular: Negative for chest pain, palpitations, orthopnea, claudication, leg swelling and PND.  Gastrointestinal: Negative for heartburn, nausea, vomiting, abdominal pain, diarrhea, constipation, blood in  stool and melena.  Genitourinary: Negative for dysuria, urgency, frequency, hematuria and flank pain.  Musculoskeletal: Negative for myalgias, back pain, joint pain and falls.  Skin: Negative for itching and rash.  Neurological: Negative for dizziness, tingling, tremors, sensory change, speech change, focal weakness, seizures, loss of consciousness, weakness and headaches.  Endo/Heme/Allergies: Negative for environmental allergies and polydipsia. Does not bruise/bleed easily.  SUBJECTIVE:   VITAL SIGNS: Temp:  [97.6 F (36.4 C)] 97.6 F (36.4 C) (12/08 2357) Pulse Rate:  [116-117] 117 (12/09 0030) Resp:  [30-32] 30 (12/09 0030) BP: (102-130)/(43-71) 102/43 mmHg (12/09 0030) SpO2:  [98 %-100 %] 100 % (12/09 0030)  PHYSICAL EXAMINATION: General:  In no acute distress but some coughing Neuro:  Awake and alert nonfocal HEENT:  Dry mucous membranes Cardiovascular:  Regular rate and rhythm normal S1-S2 no S3 or S4 Lungs:  Expired wheezes and prominent pseudo-wheeze Abdomen:  Soft nontender bowel sounds active no organomegaly Musculoskeletal:  Full range of motion no joint deformity Skin:  Clear   Recent Labs Lab 09/18/13 0048  NA 137  K 3.4*  CL 96  BUN 12  CREATININE 1.00  GLUCOSE 273*    Recent Labs Lab 09/18/13 0048  HGB 12.9  HCT 38.0   Dg Chest Portable 1 View  09/18/2013   CLINICAL DATA:  Wheezing, shortness of breath and respiratory distress.  EXAM: PORTABLE CHEST - 1 VIEW  COMPARISON:  Chest radiograph performed 04/11/2013  FINDINGS: The lungs are hypoexpanded. Vascular crowding is seen. Mild bibasilar opacities likely reflect atelectasis, though mild left basilar pneumonia cannot be excluded. No pleural effusion or pneumothorax is seen.  The cardiomediastinal silhouette is borderline normal in size. No acute osseous abnormalities are seen.  IMPRESSION: Lungs hypoexpanded. Mild bibasilar airspace opacities likely reflect atelectasis, though mild left basilar pneumonia  cannot be excluded.   Electronically Signed   By: Roanna Raider M.D.   On: 09/18/2013 01:02    ASSESSMENT / PLAN: Principal Problem:   Obstructive chronic bronchitis with exacerbation Active Problems:   Sarcoidosis of lung   HYPERTENSION   GERD   Type II diabetes mellitus with neurological manifestations, uncontrolled   Chronic cough  Acute bronchitis   #1 history of pulmonary sarcoidosis and cyclical cough with high level reflux disease now with progressive left basilar atelectasis and cannot rule out early pneumonia with associated cyclical cough exacerbation in mild respiratory distress  Plan Administer IV Levaquin IV Medrol Nebulized therapy Cough control benzonatate and Tussionex Flutter valve  #2 diabetes type 2 with complication Plan Sliding-scale insulin protocol  #3 chronic diastolic heart failure Plan Maintain outpatient medications  #4 severe reflux disease Plan Maintain proton pump inhibitor twice daily IV  Caryl Bis  [phone]  Cell  [phone]  If no response or cell goes to voicemail, call beeper [phone]  Pulmonary and Critical Care Medicine [facility] Pager: [phone]  09/18/2013, 1:10 AM

## 2013-09-18 NOTE — Progress Notes (Signed)
Utilization review completed. Marshun Duva, RN, BSN. 

## 2013-09-18 NOTE — Progress Notes (Signed)
Pt transferred from the ED, admitted to Rm/3S04. Pt comes from home with husband. She is alert and oriented. Ambulates independantly. No skin breakdown noted. Placed on telemetry, currently ST. Oriented to room, instructed to call for assistance before getting out of bed. Resting comfortably at this time on 2L [place], no s/s of resp distress. Will continue to monitor

## 2013-09-18 NOTE — ED Provider Notes (Signed)
CSN: [id number]     Arrival date & time 09/17/13  2351 History   First MD Initiated Contact with Patient 09/18/13 0017     Chief Complaint  Patient presents with  . Shortness of Breath  . Wheezing  . Cough   (Consider location/radiation/quality/duration/timing/severity/associated sxs/prior Treatment) HPI History per patient. Shortness of breath and wheezing worse over the last few days. Has history of sarcoidosis, heart failure. She uses albuterol at home. Symptoms not improving with her medications and presents here for evaluation. She denies any recent weight gain. Has had some back pain worse with dyspnea but denies any chest pain. No fevers or chills. No known sick contacts. Symptoms moderate to severe.  Past Medical History  Diagnosis Date  . DIABETES MELLITUS, TYPE II 08/21/2006  . HYPERLIPIDEMIA 08/21/2006  . GOUT 08/20/2010  . OBESITY 06/04/2009  . ANEMIA-UNSPECIFIED 09/18/2009  . HYPERTENSION 08/21/2006  . GERD 08/21/2006  . SLEEP APNEA 04/21/2009    last testing was negative  . Internal hemorrhoids   . Pulmonary sarcoidosis     Followed locally by pulmonology, but also by Dr. Sandy Salaam at [facility]  . Exertional chest pain     sharp, substernal, exertional  . Vocal cord dysfunction   . Chronic diastolic heart failure, NYHA class 2     LVEDP roughly 20% by cath   Past Surgical History  Procedure Laterality Date  . Ventral hernia repair    . Nissen fundoplication  2004  . Cholecystectomy  1984  . Abdominal hysterectomy    . Knee arthroscopy      right  . Tubal ligation      with reversal in 1994  . Bladder suspension  11/11/2011    Procedure: TRANSVAGINAL TAPE (TVT) PROCEDURE;  Surgeon: Levi Aland, MD;  Location: [facility];  Service: Gynecology;  Laterality: N/A;  . Cystoscopy  11/11/2011    Procedure: CYSTOSCOPY;  Surgeon: Levi Aland, MD;  Location: [facility];  Service: Gynecology;  Laterality: N/A;  . Doppler echocardiography  02/12/2013    LV  FUNCTION, SIZE NORMAL; MILD CONCENTRIC LVH; EST EF 55-65%; WALL MOTION NORMAL  . Lexiscan myoview  03/09/2013    EF 50%; NORMAL MYOCARDIAL PERFUSION STUDY - breast attenuation  . Cardiac catheterization  07/2010    LVEF 50-55% WITH VERY MILD GLOBAL HYPOKINESIA; ESSENTIALLY NORMAL CORONARY ARTERIES; NORMAL LV FUNCTION  . Carotids  02/18/11    CAROTID DUPLEX; VERTEBRALS ARE PATENT WITH ANTEGRADE FLOW. ICA/CCA RATIO 1.61 ON RIGHT AND 0.75 ON LEFT  . Right and left cardiac catheterization  04/23/2013    Angiographic normal coronaries; LVEDP 20 mmHg, PCWP 12-14 mmHg, RAP 12 mmHg.; Fick CO/CI 4.9/2.2   Family History  Problem Relation Age of Onset  . Diabetes Father   . Heart attack Father   . Coronary artery disease Father   . Heart failure Father   . COPD Mother   . Emphysema Mother   . Asthma Mother   . Heart failure Mother   . Sarcoidosis Maternal Uncle   . Colon cancer Neg Hx   . Lung cancer Brother   . Cancer Brother   . Diabetes Brother   . Heart attack Maternal Grandfather    History  Substance Use Topics  . Smoking status: Never Smoker   . Smokeless tobacco: Never Used  . Alcohol Use: No   OB History   Grav Para Term Preterm Abortions TAB SAB Ect Mult Living  Review of Systems  Constitutional: Negative for fever and chills.  HENT: Negative for voice change.   Respiratory: Positive for cough, shortness of breath and wheezing.   Cardiovascular: Negative for leg swelling.  Gastrointestinal: Negative for vomiting and abdominal pain.  Genitourinary: Negative for flank pain.  Musculoskeletal: Positive for back pain. Negative for neck pain.  Skin: Negative for rash.  Neurological: Negative for headaches.  All other systems reviewed and are negative.    Allergies  Lisinopril; Methotrexate; and Clindamycin/lincomycin  Home Medications   Current Outpatient Rx  Name  Route  Sig  Dispense  Refill  . albuterol (PROVENTIL HFA;VENTOLIN HFA) 108 (90 BASE)  MCG/ACT inhaler   Inhalation   Inhale 2 puffs into the lungs every 6 (six) hours as needed. For wheezing.         [name] albuterol (PROVENTIL) (5 MG/ML) 0.5% nebulizer solution   Nebulization   Take 2.5 mg by nebulization every 6 (six) hours as needed. For wheezing.         [name] allopurinol (ZYLOPRIM) 100 MG tablet   Oral   Take 100 mg by mouth at bedtime.          [name] aspirin EC 81 MG tablet   Oral   Take 81 mg by mouth at bedtime.          [name] atenolol (TENORMIN) 50 MG tablet   Oral   Take 50 mg by mouth daily.          [name] atorvastatin (LIPITOR) 40 MG tablet   Oral   Take 40 mg by mouth at bedtime.         [name] buPROPion (WELLBUTRIN XL) 300 MG 24 hr tablet   Oral   Take 300 mg by mouth daily.         . carvedilol (COREG) 12.5 MG tablet   Oral   Take 1 tablet (12.5 mg total) by mouth 2 (two) times daily with a meal.   60 tablet   3   . chlorpheniramine-HYDROcodone (TUSSIONEX) 10-8 MG/5ML LQCR   Oral   Take 10 mLs by mouth every 12 (twelve) hours as needed. For cough.   240 mL   0   . cholecalciferol (VITAMIN D) 1000 UNITS tablet   Oral   Take 1,000 Units by mouth 2 (two) times daily.         . ciclesonide (OMNARIS) 50 MCG/ACT nasal spray   Each Nare   Place 1 spray into both nostrils daily.          . cloNIDine (CATAPRES) 0.1 MG tablet   Oral   Take 0.1 mg by mouth daily.          [name] DEXILANT 60 MG capsule      Take one capsule by mouth one time daily   30 capsule   5   . diltiazem (CARDIZEM CD) 120 MG 24 hr capsule   Oral   Take 1 capsule (120 mg total) by mouth every evening.   30 capsule   11   . estradiol (ESTRACE) 2 MG tablet   Oral   Take 2 mg by mouth at bedtime.          . fenofibrate (TRICOR) 145 MG tablet   Oral   Take 145 mg by mouth daily.         . furosemide (LASIX) 40 MG tablet   Oral   Take 1 tablet (40 mg total) by mouth 2 (two) times daily. Take additional 40  mg in AM of 7/15 & 7/16 - to shoot for dry wgt of ~2 lb  below current weight.   45 tablet   12   . glyBURIDE (DIABETA) 2.5 MG tablet      Take one tablet by mouth daily with breakfast   30 tablet   3   . insulin glargine (LANTUS) 100 UNIT/ML injection   Subcutaneous   Inject 23 Units into the skin at bedtime.         [name] LORazepam (ATIVAN) 1 MG tablet   Oral   Take 1 mg by mouth 2 (two) times daily as needed for anxiety.         [name] losartan (COZAAR) 100 MG tablet   Oral   Take 100 mg by mouth at bedtime.         . metFORMIN (GLUCOPHAGE) 500 MG tablet   Oral   Take 500 mg by mouth 2 (two) times daily with a meal.         . mometasone-formoterol (DULERA) 200-5 MCG/ACT AERO   Inhalation   Inhale 2 puffs into the lungs 2 (two) times daily.   1 Inhaler   6   . nitroGLYCERIN (NITROSTAT) 0.4 MG SL tablet   Sublingual   Place 0.4 mg under the tongue every 5 (five) minutes as needed. x3 doses as needed for chest pain.         [name] omeprazole (PRILOSEC) 20 MG capsule      Take one capsule by mouth one time daily   90 capsule   1   . potassium chloride SA (K-DUR,KLOR-CON) 20 MEQ tablet      Take one tablet by mouth three times daily   90 tablet   4   . predniSONE (DELTASONE) 5 MG tablet   Oral   Take 10 mg by mouth every morning.          . venlafaxine XR (EFFEXOR-XR) 75 MG 24 hr capsule      Take one capsule by mouth twice daily   60 capsule   4   . B-D ULTRAFINE III SHORT PEN 31G X 8 MM MISC      Follow directions provided by physician   30 each   2    BP 102/43  Pulse 117  Temp(Src) 97.6 F (36.4 C) (Oral)  Resp 30  SpO2 100% Physical Exam  Constitutional: She is oriented to person, place, and time. She appears well-developed and well-nourished.  HENT:  Head: Normocephalic and atraumatic.  Eyes: EOM are normal. Pupils are equal, round, and reactive to light.  Neck: Neck supple. No tracheal deviation present.  Cardiovascular: Regular rhythm and intact distal pulses.   Tachycardic   Pulmonary/Chest: No stridor.  Inspiratory and expiratory wheezes with tachypnea  Abdominal: Soft. She exhibits no distension. There is no tenderness.  Musculoskeletal: Normal range of motion. She exhibits no tenderness.  Neurological: She is alert and oriented to person, place, and time.  Skin: Skin is warm and dry.    ED Course  Procedures (including critical care time) Labs Review Labs Reviewed  POCT I-STAT, CHEM 8 - Abnormal; Notable for the following:    Potassium 3.4 (*)    Glucose, Bld 273 (*)    Calcium, Ion 1.06 (*)    All other components within normal limits  PRO B NATRIURETIC PEPTIDE  CBC WITH DIFFERENTIAL  POCT I-STAT TROPONIN I   Imaging Review Dg Chest Portable 1 View  09/18/2013   CLINICAL DATA:  Wheezing, shortness of breath and respiratory distress.  EXAM: PORTABLE CHEST - 1 VIEW  COMPARISON:  Chest radiograph performed 04/11/2013  FINDINGS: The lungs are hypoexpanded. Vascular crowding is seen. Mild bibasilar opacities likely reflect atelectasis, though mild left basilar pneumonia cannot be excluded. No pleural effusion or pneumothorax is seen.  The cardiomediastinal silhouette is borderline normal in size. No acute osseous abnormalities are seen.  IMPRESSION: Lungs hypoexpanded. Mild bibasilar airspace opacities likely reflect atelectasis, though mild left basilar pneumonia cannot be excluded.   Electronically Signed   By: Roanna Raider M.D.   On: 09/18/2013 01:02    EKG Interpretation    Date/Time:  Monday September 17 2013 23:59:00 EST Ventricular Rate:  116 PR Interval:  150 QRS Duration: 78 QT Interval:  342 QTC Calculation: 475 R Axis:   86 Text Interpretation:  Sinus tachycardia Otherwise normal ECG Since last tracing rate faster Confirmed by OTTER  MD, OLGA ([id number]) on 09/18/2013 12:04:03 AM           Albuterol treatment provided. IV steroids. IV antibiotics.  Dr Delford Field to admit  MDM   1. Obstructive chronic bronchitis with exacerbation   2.  Acute bronchitis   3. Chronic cough   4. Chronic diastolic heart failure, NYHA class 2   5. Esophageal reflux   6. Sarcoidosis of lung   7. Type II diabetes mellitus with neurological manifestations, uncontrolled   8. Unspecified essential hypertension      Evaluated with labs, chest x-ray and EKG. Occasions provided with persistent symptoms. MED admit    Sunnie Nielsen, MD 09/18/13 [phone]

## 2013-09-18 NOTE — ED Notes (Signed)
Portable chest xray at bedside. Dr. Dierdre Highman at bedside.

## 2013-09-18 NOTE — ED Notes (Addendum)
Pt of PCCM Dr. Delford Field. C/o non-productive cough 3-4d ago, denies fever. H/o sarcoidosis. Wheezing onset PTA. Took albuterol neb at 2300, also used her dulera.  Respiritory distress increasing, SPO2 98% RA. Pt back to room, RT, RN, cxr and EDP notified. Husband with pt. Pt back in w/cwith EMT, EKG complete. Neb in progress.

## 2013-09-18 NOTE — Progress Notes (Signed)
PULMONARY / CRITICAL CARE MEDICINE  Name: [name]  MRN: [id number]  DOB: [birth year]  ADMISSION DATE: 09/18/2013  REFERRING MD : EDP  PRIMARY SERVICE: PCCM  CHIEF COMPLAINT:  Acute shortness of breath and cough   BRIEF PATIENT DESCRIPTION:  52 year old female with severe reflux disease and sarcoidosis admitted with acute cyclical cough  SIGNIFICANT EVENTS / STUDIES:  LINES / TUBES:  PIV  CULTURES:  None  ANTIBIOTICS:  Levaquin 09/18/2013   HISTORY OF PRESENT ILLNESS:  This is a 52 year old white female well-known to my service with history of pulmonary sarcoidosis and high level reflux disease with cyclical cough. The patient's had a two-day history of increasing cough that is dry. There is increasing chest discomfort and the patient comes emergent department with acute dyspnea and is admitted for further inpatient care   SUBJECTIVE: c/o cough Mild better Afebrile C/o dyspnea, no CP   VITAL SIGNS:  Temp: [97.6 F (36.4 C)] 97.6 F (36.4 C) (12/08 2357)  Pulse Rate: [116-117] 117 (12/09 0030)  Resp: [30-32] 30 (12/09 0030)  BP: (102-130)/(43-71) 102/43 mmHg (12/09 0030)  SpO2: [98 %-100 %] 100 % (12/09 0030)  PHYSICAL EXAMINATION:  General: In no acute distress but some coughing  Neuro: Awake and alert nonfocal  HEENT: Dry mucous membranes  Cardiovascular: Regular rate and rhythm normal S1-S2 no S3 or S4  Lungs: decreased Expired wheezes and prominent pseudo-wheeze  Abdomen: Soft nontender bowel sounds active no organomegaly  Musculoskeletal: Full range of motion no joint deformity  Skin: Clear   Recent Labs  Lab  09/18/13 0048   NA  137   K  3.4*   CL  96   BUN  12   CREATININE  1.00   GLUCOSE  273*     Recent Labs  Lab  09/18/13 0048   HGB  12.9   HCT  38.0    Dg Chest Portable 1 View  09/18/2013 CLINICAL DATA: Wheezing, shortness of breath and respiratory distress. EXAM: PORTABLE CHEST - 1 VIEW COMPARISON: Chest radiograph performed 04/11/2013  FINDINGS: The lungs are hypoexpanded. Vascular crowding is seen. Mild bibasilar opacities likely reflect atelectasis, though mild left basilar pneumonia cannot be excluded. No pleural effusion or pneumothorax is seen. The cardiomediastinal silhouette is borderline normal in size. No acute osseous abnormalities are seen. IMPRESSION: Lungs hypoexpanded. Mild bibasilar airspace opacities likely reflect atelectasis, though mild left basilar pneumonia cannot be excluded. Electronically Signed By: Roanna Raider M.D. On: 09/18/2013 01:02   ASSESSMENT / PLAN:  Principal Problem:  Obstructive chronic bronchitis with exacerbation  Active Problems:  Sarcoidosis of lung  HYPERTENSION  GERD  Type II diabetes mellitus with neurological manifestations, uncontrolled  Chronic cough  Acute bronchitis   #1 history of pulmonary sarcoidosis and cyclical cough with high level reflux disease now with progressive left basilar atelectasis and cannot rule out early pneumonia with associated cyclical cough exacerbation in mild respiratory distress  Plan  Administer IV Levaquin  IV Medrol -lower to 40 q 8h -  Nebulized therapy  Cough control benzonatate and Tussionex  Flutter valve   #2 diabetes type 2 with complication -uncontroled due to steroids Plan  Sliding-scale insulin protocol -reisstant Increase lantus to 30 Increase meal coverage to 8 u  #3 chronic diastolic heart failure  Plan  Maintain outpatient medications   #4 severe reflux disease  Plan  Maintain proton pump inhibitor twice daily IV  Ok to transfer to floor, needs IV meds another 24-48h  Cyril Mourning MD. Tonny Bollman. [place]  Pulmonary & Critical care Pager [phone] If no response call [phone]

## 2013-09-19 LAB — BASIC METABOLIC PANEL
BUN: 20 mg/dL (ref 6–23)
CO2: 29 mEq/L (ref 19–32)
Calcium: 9.4 mg/dL (ref 8.4–10.5)
Chloride: 93 mEq/L — ABNORMAL LOW (ref 96–112)
Creatinine, Ser: 0.76 mg/dL (ref 0.50–1.10)
GFR calc Af Amer: >90 mL/min (ref >90)
GFR calc non Af Amer: >90 mL/min (ref >90)
Glucose, Bld: 304 mg/dL — ABNORMAL HIGH (ref 70–99)
Potassium: 4.3 mEq/L (ref 3.5–5.1)
Sodium: 137 mEq/L (ref 135–145)

## 2013-09-19 LAB — GLUCOSE, CAPILLARY
Glucose-Capillary: 284 mg/dL — ABNORMAL HIGH (ref 70–99)
Glucose-Capillary: 314 mg/dL — ABNORMAL HIGH (ref 70–99)
Glucose-Capillary: 227 mg/dL — ABNORMAL HIGH (ref 70–99)
Glucose-Capillary: 210 mg/dL — ABNORMAL HIGH (ref 70–99)

## 2013-09-19 MED ORDER — BENZONATATE 200 MG PO CAPS: 200.0000 mg | ORAL_CAPSULE | Freq: Three times a day (TID) | ORAL | Status: DC

## 2013-09-19 MED ORDER — PREDNISONE 20 MG PO TABS: ORAL_TABLET | ORAL | Status: DC

## 2013-09-19 MED ORDER — LEVOFLOXACIN 750 MG PO TABS
750.0000 mg | ORAL_TABLET | Freq: Every day | ORAL | Status: DC
  Administered 2013-09-19: 750 mg via ORAL
  Filled 2013-09-19 (×2): qty 1

## 2013-09-19 MED ORDER — PANTOPRAZOLE SODIUM 40 MG PO TBEC
40.0000 mg | DELAYED_RELEASE_TABLET | Freq: Two times a day (BID) | ORAL | Status: DC
  Administered 2013-09-19 – 2013-09-20 (×2): 40 mg via ORAL
  Filled 2013-09-19 (×2): qty 1

## 2013-09-19 MED ORDER — INSULIN GLARGINE 100 UNIT/ML ~~LOC~~ SOLN
35.0000 [IU] | Freq: Every day | SUBCUTANEOUS | Status: DC
Start: 2013-09-19 — End: 2013-09-20
  Administered 2013-09-19: 35 [IU] via SUBCUTANEOUS
  Filled 2013-09-19 (×2): qty 0.35

## 2013-09-19 MED ORDER — LEVOFLOXACIN 750 MG PO TABS: 750.0000 mg | ORAL_TABLET | Freq: Every day | ORAL | Status: DC

## 2013-09-19 MED ORDER — SODIUM CHLORIDE 0.9 % IN NEBU
INHALATION_SOLUTION | RESPIRATORY_TRACT | Status: AC
  Administered 2013-09-19
  Filled 2013-09-19: qty 3

## 2013-09-19 MED ORDER — OMEPRAZOLE 20 MG PO CPDR: 20.0000 mg | DELAYED_RELEASE_CAPSULE | Freq: Two times a day (BID) | ORAL | Status: DC

## 2013-09-19 MED ORDER — PREDNISONE 20 MG PO TABS
40.0000 mg | ORAL_TABLET | Freq: Every day | ORAL | Status: DC
  Administered 2013-09-20: 40 mg via ORAL
  Filled 2013-09-19 (×2): qty 2

## 2013-09-19 MED ORDER — INSULIN ASPART 100 UNIT/ML ~~LOC~~ SOLN
10.0000 [IU] | Freq: Three times a day (TID) | SUBCUTANEOUS | Status: DC
  Administered 2013-09-19 – 2013-09-20 (×2): 10 [IU] via SUBCUTANEOUS

## 2013-09-19 NOTE — Progress Notes (Signed)
Pt bp 156/66. MD paged. Awaiting call back.

## 2013-09-19 NOTE — Progress Notes (Signed)
Went over "Living Better with Heart Failure" booklet with pt.

## 2013-09-19 NOTE — Progress Notes (Signed)
PULMONARY / CRITICAL CARE MEDICINE  Name: [name]  MRN: [id number]  DOB: 06/14/1961  ADMISSION DATE: 09/18/2013   REFERRING MD : EDP  PRIMARY SERVICE: PCCM   CHIEF COMPLAINT:  Acute shortness of breath and cough   BRIEF PATIENT DESCRIPTION:  52 year old female with severe reflux disease and sarcoidosis admitted with acute cyclical cough   SIGNIFICANT EVENTS / STUDIES:   LINES / TUBES:  PIV   CULTURES:  None   ANTIBIOTICS:  Levaquin 12/09>>>   SUBJECTIVE:  Still c/o frequent cough and DOE above baseline when walking in hall.     VITAL SIGNS:  Temp: [97.6 F (36.4 C)] 97.6 F (36.4 C) (12/08 2357)  Pulse Rate: [116-117] 117 (12/09 0030)  Resp: [30-32] 30 (12/09 0030)  BP: (102-130)/(43-71) 102/43 mmHg (12/09 0030)  SpO2: [98 %-100 %] 100 % (12/09 0030)   PHYSICAL EXAMINATION:  General: pleasant female, NAD on side of bed, coughing  Neuro: Awake and alert nonfocal  HEENT: Dry mucous membranes  Cardiovascular: Regular rate and rhythm normal S1-S2 no S3 or S4  Lungs: resps even non labored, no audible wheeze but prominent pseudo-wheeze  Abdomen: Soft nontender bowel sounds active no organomegaly  Musculoskeletal: Full range of motion no joint deformity  Skin: Clear    Recent Labs Lab 09/18/13 0048 09/19/13 0608  NA 137 137  K 3.4* 4.3  CL 96 93*  CO2  --  29  GLUCOSE 273* 304*  BUN 12 20  CREATININE 1.00 0.76  CALCIUM  --  9.4    Recent Labs Lab 09/18/13 0019 09/18/13 0048  HGB 11.9* 12.9  HCT 35.2* 38.0  WBC 17.5*  --   PLT 427*  --    Dg Chest Portable 1 View  09/18/2013   CLINICAL DATA:  Wheezing, shortness of breath and respiratory distress.  EXAM: PORTABLE CHEST - 1 VIEW  COMPARISON:  Chest radiograph performed 04/11/2013  FINDINGS: The lungs are hypoexpanded. Vascular crowding is seen. Mild bibasilar opacities likely reflect atelectasis, though mild left basilar pneumonia cannot be excluded. No pleural effusion or pneumothorax is  seen.  The cardiomediastinal silhouette is borderline normal in size. No acute osseous abnormalities are seen.  IMPRESSION: Lungs hypoexpanded. Mild bibasilar airspace opacities likely reflect atelectasis, though mild left basilar pneumonia cannot be excluded.   Electronically Signed   By: Roanna Raider M.D.   On: 09/18/2013 01:02    ASSESSMENT / PLAN:  Principal Problem:   Obstructive chronic bronchitis with exacerbation Active Problems:   Sarcoidosis of lung   HYPERTENSION   GERD   Type II diabetes mellitus with neurological manifestations, uncontrolled   Chronic cough   Acute bronchitis   #1 history of pulmonary sarcoidosis and cyclical cough with high level reflux disease now with progressive left basilar atelectasis and cannot rule out early pneumonia with associated cyclical cough exacerbation  Plan  Cont levaquin - change to PO  Change to PO pred and taper (10mg /day baseline) Nebulized therapy  Cough control benzonatate and Tussionex  Flutter valve  Will f/u CXR in am prior to ?d/c to re-eval L basilar atx   #2 diabetes type 2 with complication -uncontroled due to steroids Plan  Sliding-scale insulin protocol -reisstant Increase lantus to 35 - watch with taper steroids  Increase meal coverage to 10 u  #3 chronic diastolic heart failure  Plan  Maintain outpatient medications   #4 severe reflux disease  Plan  Maintain proton pump inhibitor twice daily IV   Likely ready for d/c  12/11.   Will cont work on ambulation and cough control.    Danford Bad, NP 09/19/2013  11:30 AM Pager: [phone] or [phone]  *Care during the described time interval was provided by me and/or other providers on the critical care team. I have reviewed this patient's available data, including medical history, events of note, physical examination and test results as part of my evaluation.  Keaja Reaume V.

## 2013-09-19 NOTE — Progress Notes (Signed)
Pt tx per MD order, pt tol well, pt VSS, report called to receiving RN, all questions answered, pt verbalized understanding of tx

## 2013-09-19 NOTE — Discharge Summary (Signed)
Physician Discharge Summary  Patient ID: [name] MRN: [id number] DOB/AGE: 02/17/1961 52 y.o.  Admit date: 09/18/2013 Discharge date: 09/20/2013    Discharge Diagnoses:  Principal Problem:   Obstructive chronic bronchitis with exacerbation Active Problems:   Sarcoidosis of lung   HYPERTENSION   GERD   Type II diabetes mellitus with neurological manifestations, uncontrolled   Chronic cough   Acute bronchitis    Brief Summary: [name] is a 52 y.o. y/o female with a PMH of diastolic heart failure, severe reflux, sarcoidosis presented 12/9 with 2 day hx increasing dry cough and SOB.  She was admitted with exacerbation of cyclical cough and mild resp distress. She did have some L basilar atx.  She was treated with IV steroids, abx, bronchodilators, cough suppression.  F/u CXR revealed improved aeration and no suggestion of PNA .  Although she did have some increased DOE above baseline, O2 sats were ok on ambulatory desat and at time of d/c she is near baseline from resp standpoint and cough is much improved with increased GERD rx, steroids and cough suppression. She will f/u closely as an outpt.    SIGNIFICANT EVENTS / STUDIES:   LINES / TUBES:  PIV   CULTURES:  None   ANTIBIOTICS:  Levaquin 12/09>>>                                                                 D/c plan by Discharge Diagnosis  Cyclic cough with exacerbation , Bronchospasm -resolved, doubt she had pneumonia -bibasal opacities resolved Pulmonary sarcoidosis D/c Plan  Cont levaquin x 5 days total  PO pred and taper (10mg /day baseline)  Cont outpt pulm rx as before  Cough control - benzonatate and Tussionex  outpt pulm f/u 2 weeks - consider pulm rehab referral & wt loss program  diabetes type 2 with complication -uncontroled due to steroids.  Much improved with steroid taper   D/c Plan  Cont prior DM meds  Cont home lantus  PCP f/u   chronic diastolic heart failure  HTN D/c Plan   Maintain prior outpatient medications -- B blocker, asa, statin, coreg, clonidine, cardizem, lasix  PCP f/u   severe reflux disease  D/c Plan  PPI BID  Cont dexilent   Filed Vitals:   09/19/13 0917 09/19/13 1427 09/19/13 2053 09/20/13 0622  BP: 157/75 129/65 132/76 130/75  Pulse: 101 81 80 84  Temp:  97.8 F (36.6 C) 97.7 F (36.5 C) 98.3 F (36.8 C)  TempSrc:  Oral Oral Oral  Resp: 19 20 18 18   Height:      Weight:      SpO2: 97% 96% 97% 94%     Discharge Labs  BMET  Recent Labs Lab 09/18/13 0048 09/19/13 0608  NA 137 137  K 3.4* 4.3  CL 96 93*  CO2  --  29  GLUCOSE 273* 304*  BUN 12 20  CREATININE 1.00 0.76  CALCIUM  --  9.4     CBC   Recent Labs Lab 09/18/13 0019 09/18/13 0048  HGB 11.9* 12.9  HCT 35.2* 38.0  WBC 17.5*  --   PLT 427*  --    Anti-Coagulation No results found for this basename: INR,  in the last 168 hours  Future Appointments Provider Department Dept Phone   10/01/2013 1:45 PM Marykay Lex, MD [facility] [phone]   10/02/2013 2:15 PM Julio Sicks, NP [facility] [phone]   10/15/2013 10:00 AM Storm Frisk, MD [facility] [phone]   12/10/2013 8:45 AM Lindley Magnus, MD [facility] [phone]           Follow-up Information   Follow up with [name], MD. Schedule an appointment as soon as possible for a visit in 2 weeks.   Specialties:  Internal Medicine, Radiology   Contact information:   [address] [place] [address] [phone]       Follow up with Rubye Oaks, NP On 10/02/2013. (2:15pm )    Specialty:  Nurse Practitioner   Contact information:   [address] [place] [address] [phone]          Medication List         albuterol 108 (90 BASE) MCG/ACT inhaler  Commonly known as:  PROVENTIL HFA;VENTOLIN HFA  Inhale 2 puffs into the lungs every 6 (six) hours as needed. For wheezing.      albuterol (5 MG/ML) 0.5% nebulizer solution  Commonly known as:  PROVENTIL  Take 2.5 mg by nebulization every 6 (six) hours as needed. For wheezing.     allopurinol 100 MG tablet  Commonly known as:  ZYLOPRIM  Take 100 mg by mouth at bedtime.     aspirin EC 81 MG tablet  Take 81 mg by mouth at bedtime.     atenolol 50 MG tablet  Commonly known as:  TENORMIN  Take 50 mg by mouth daily.     atorvastatin 40 MG tablet  Commonly known as:  LIPITOR  Take 40 mg by mouth at bedtime.     B-D ULTRAFINE III SHORT PEN 31G X 8 MM Misc  Generic drug:  Insulin Pen Needle  Follow directions provided by physician     benzonatate 200 MG capsule  Commonly known as:  TESSALON  Take 1 capsule (200 mg total) by mouth 3 (three) times daily.     buPROPion 300 MG 24 hr tablet  Commonly known as:  WELLBUTRIN XL  Take 300 mg by mouth daily.     carvedilol 12.5 MG tablet  Commonly known as:  COREG  Take 1 tablet (12.5 mg total) by mouth 2 (two) times daily with a meal.     CATAPRES 0.1 MG tablet  Generic drug:  cloNIDine  Take 0.1 mg by mouth daily.     chlorpheniramine-HYDROcodone 10-8 MG/5ML Lqcr  Commonly known as:  TUSSIONEX  Take 10 mLs by mouth every 12 (twelve) hours as needed. For cough.     cholecalciferol 1000 UNITS tablet  Commonly known as:  VITAMIN D  Take 1,000 Units by mouth 2 (two) times daily.     ciclesonide 50 MCG/ACT nasal spray  Commonly known as:  OMNARIS  Place 1 spray into both nostrils daily.     DEXILANT 60 MG capsule  Generic drug:  dexlansoprazole  Take one capsule by mouth one time daily     diltiazem 120 MG 24 hr capsule  Commonly known as:  CARDIZEM CD  Take 1 capsule (120 mg total) by mouth every evening.     estradiol 2 MG tablet  Commonly known as:  ESTRACE  Take 2 mg by mouth at bedtime.     fenofibrate 145 MG tablet  Commonly known as:  [employer]  Take 145 mg by mouth daily.     furosemide 40 MG tablet  Commonly known as:  LASIX  Take 1  tablet (40 mg total) by mouth 2 (two) times daily. Take additional 40 mg in AM of 7/15 & 7/16 - to shoot for dry wgt of ~2 lb below current weight.     glyBURIDE 2.5 MG tablet  Commonly known as:  DIABETA  Take one tablet by mouth daily with breakfast     insulin glargine 100 UNIT/ML injection  Commonly known as:  LANTUS  Inject 23 Units into the skin at bedtime.     levofloxacin 750 MG tablet  Commonly known as:  LEVAQUIN  Take 1 tablet (750 mg total) by mouth daily.     LORazepam 1 MG tablet  Commonly known as:  ATIVAN  Take 1 mg by mouth 2 (two) times daily as needed for anxiety.     losartan 100 MG tablet  Commonly known as:  COZAAR  Take 100 mg by mouth at bedtime.     metFORMIN 500 MG tablet  Commonly known as:  GLUCOPHAGE  Take 500 mg by mouth 2 (two) times daily with a meal.     mometasone-formoterol 200-5 MCG/ACT Aero  Commonly known as:  DULERA  Inhale 2 puffs into the lungs 2 (two) times daily.     nitroGLYCERIN 0.4 MG SL tablet  Commonly known as:  NITROSTAT  Place 0.4 mg under the tongue every 5 (five) minutes as needed. x3 doses as needed for chest pain.     omeprazole 20 MG capsule  Commonly known as:  PRILOSEC  Take 1 capsule (20 mg total) by mouth 2 (two) times daily before a meal.     potassium chloride SA 20 MEQ tablet  Commonly known as:  K-DUR,KLOR-CON  Take one tablet by mouth three times daily     predniSONE 20 MG tablet  Commonly known as:  DELTASONE  4 tabs PO daily x 3 days then 3 tabs PO daily x 3 days then 2 tabs PO daily x 3 days then back to 10mg  PO daily maintenance     predniSONE 5 MG tablet  Commonly known as:  DELTASONE  Take 10 mg by mouth every morning.     venlafaxine XR 75 MG 24 hr capsule  Commonly known as:  EFFEXOR-XR  Take one capsule by mouth twice daily          Disposition: 01-Home or Self Care  Discharged Condition: [name] has met maximum benefit of inpatient care and is medically stable and cleared  for discharge.  Patient is pending follow up as above.      Time spent on disposition:  Greater than 35 minutes.   SignedDanford Bad, NP 09/20/2013  9:16 AM Pager: [phone] or [phone]  *Care during the described time interval was provided by me and/or other providers on the critical care team. I have reviewed this patient's available data, including medical history, events of note, physical examination and test results as part of my evaluation.  ALVA,RAKESH V.

## 2013-09-19 NOTE — Care Management Note (Signed)
    Page 1 of 1   09/20/2013     10:53:33 AM   CARE MANAGEMENT NOTE 09/20/2013  Patient:  [name], [name]   Account Number:  [id number]  Date Initiated:  09/19/2013  Documentation initiated by:  Letha Cape  Subjective/Objective Assessment:   dx obstructive chronic bronchitis with ex  admit- lives with spouse.     Action/Plan:   Anticipated DC Date:  09/20/2013   Anticipated DC Plan:  HOME/SELF CARE      DC Planning Services  CM consult      Choice offered to / List presented to:             Status of service:  Completed, signed off Medicare Important Message given?   (If response is "NO", the following Medicare IM given date fields will be blank) Date Medicare IM given:   Date Additional Medicare IM given:    Discharge Disposition:  HOME/SELF CARE  Per UR Regulation:  Reviewed for med. necessity/level of care/duration of stay  If discussed at Long Length of Stay Meetings, dates discussed:    Comments:  09/20/13 10;52 Letha Cape RN, BSN [phone] patient for dc today, No NCM referral.  No needs anticipated.  09/19/13 16:18 Letha Cape RN, BSN [phone] patient lives with spouse, NCM will continue to follow for dc needs.

## 2013-09-20 ENCOUNTER — Inpatient Hospital Stay (HOSPITAL_COMMUNITY): Payer: BC Managed Care – PPO

## 2013-09-20 LAB — GLUCOSE, CAPILLARY: Glucose-Capillary: 127 mg/dL — ABNORMAL HIGH (ref 70–99)

## 2013-09-20 MED ORDER — SODIUM CHLORIDE 0.9 % IN NEBU
INHALATION_SOLUTION | RESPIRATORY_TRACT | Status: AC
  Administered 2013-09-20: 05:00:00
  Filled 2013-09-20: qty 3

## 2013-09-20 MED ORDER — ALBUTEROL SULFATE (5 MG/ML) 0.5% IN NEBU: 2.5000 mg | INHALATION_SOLUTION | RESPIRATORY_TRACT | Status: DC

## 2013-09-20 MED ORDER — HYDROCOD POLST-CHLORPHEN POLST 10-8 MG/5ML PO LQCR: 10.0000 mL | Freq: Two times a day (BID) | ORAL | Status: DC | PRN

## 2013-09-20 MED ORDER — IPRATROPIUM BROMIDE 0.02 % IN SOLN: 0.5000 mg | RESPIRATORY_TRACT | Status: DC

## 2013-09-20 NOTE — Progress Notes (Signed)
NURSING PROGRESS NOTE  [name] [id number] Discharge Data: 09/20/2013 9:29 AM Attending Provider: Storm Frisk, MD WUJ:[id number],NWGNF Sherilyn Cooter, MD     Georgiann Mohs to be D/C'd Home per MD order.  Discussed with the patient the After Visit Summary and all questions fully answered. All IV's discontinued with no bleeding noted. All belongings returned to patient for patient to take home.   Last Vital Signs:  Blood pressure 130/75, pulse 84, temperature 98.3 F (36.8 C), temperature source Oral, resp. rate 18, height 5\' 6"  (1.676 m), weight 117.9 kg (259 lb 14.8 oz), SpO2 94.00%.  Discharge Medication List   Medication List         albuterol 108 (90 BASE) MCG/ACT inhaler  Commonly known as:  PROVENTIL HFA;VENTOLIN HFA  Inhale 2 puffs into the lungs every 6 (six) hours as needed. For wheezing.     albuterol (5 MG/ML) 0.5% nebulizer solution  Commonly known as:  PROVENTIL  Take 2.5 mg by nebulization every 6 (six) hours as needed. For wheezing.     allopurinol 100 MG tablet  Commonly known as:  ZYLOPRIM  Take 100 mg by mouth at bedtime.     aspirin EC 81 MG tablet  Take 81 mg by mouth at bedtime.     atenolol 50 MG tablet  Commonly known as:  TENORMIN  Take 50 mg by mouth daily.     atorvastatin 40 MG tablet  Commonly known as:  LIPITOR  Take 40 mg by mouth at bedtime.     B-D ULTRAFINE III SHORT PEN 31G X 8 MM Misc  Generic drug:  Insulin Pen Needle  Follow directions provided by physician     benzonatate 200 MG capsule  Commonly known as:  TESSALON  Take 1 capsule (200 mg total) by mouth 3 (three) times daily.     buPROPion 300 MG 24 hr tablet  Commonly known as:  WELLBUTRIN XL  Take 300 mg by mouth daily.     carvedilol 12.5 MG tablet  Commonly known as:  COREG  Take 1 tablet (12.5 mg total) by mouth 2 (two) times daily with a meal.     CATAPRES 0.1 MG tablet  Generic drug:  cloNIDine  Take 0.1 mg by mouth daily.     chlorpheniramine-HYDROcodone  10-8 MG/5ML Lqcr  Commonly known as:  TUSSIONEX  Take 10 mLs by mouth every 12 (twelve) hours as needed. For cough.     cholecalciferol 1000 UNITS tablet  Commonly known as:  VITAMIN D  Take 1,000 Units by mouth 2 (two) times daily.     ciclesonide 50 MCG/ACT nasal spray  Commonly known as:  OMNARIS  Place 1 spray into both nostrils daily.     DEXILANT 60 MG capsule  Generic drug:  dexlansoprazole  Take one capsule by mouth one time daily     diltiazem 120 MG 24 hr capsule  Commonly known as:  CARDIZEM CD  Take 1 capsule (120 mg total) by mouth every evening.     estradiol 2 MG tablet  Commonly known as:  ESTRACE  Take 2 mg by mouth at bedtime.     fenofibrate 145 MG tablet  Commonly known as:  [employer]  Take 145 mg by mouth daily.     furosemide 40 MG tablet  Commonly known as:  LASIX  Take 1 tablet (40 mg total) by mouth 2 (two) times daily. Take additional 40 mg in AM of 7/15 & 7/16 - to shoot for dry  wgt of ~2 lb below current weight.     glyBURIDE 2.5 MG tablet  Commonly known as:  DIABETA  Take one tablet by mouth daily with breakfast     insulin glargine 100 UNIT/ML injection  Commonly known as:  LANTUS  Inject 23 Units into the skin at bedtime.     levofloxacin 750 MG tablet  Commonly known as:  LEVAQUIN  Take 1 tablet (750 mg total) by mouth daily.     LORazepam 1 MG tablet  Commonly known as:  ATIVAN  Take 1 mg by mouth 2 (two) times daily as needed for anxiety.     losartan 100 MG tablet  Commonly known as:  COZAAR  Take 100 mg by mouth at bedtime.     metFORMIN 500 MG tablet  Commonly known as:  GLUCOPHAGE  Take 500 mg by mouth 2 (two) times daily with a meal.     mometasone-formoterol 200-5 MCG/ACT Aero  Commonly known as:  DULERA  Inhale 2 puffs into the lungs 2 (two) times daily.     nitroGLYCERIN 0.4 MG SL tablet  Commonly known as:  NITROSTAT  Place 0.4 mg under the tongue every 5 (five) minutes as needed. x3 doses as needed for chest pain.      omeprazole 20 MG capsule  Commonly known as:  PRILOSEC  Take 1 capsule (20 mg total) by mouth 2 (two) times daily before a meal.     potassium chloride SA 20 MEQ tablet  Commonly known as:  K-DUR,KLOR-CON  Take one tablet by mouth three times daily     predniSONE 20 MG tablet  Commonly known as:  DELTASONE  4 tabs PO daily x 3 days then 3 tabs PO daily x 3 days then 2 tabs PO daily x 3 days then back to 10mg  PO daily maintenance     predniSONE 5 MG tablet  Commonly known as:  DELTASONE  Take 10 mg by mouth every morning.     venlafaxine XR 75 MG 24 hr capsule  Commonly known as:  EFFEXOR-XR  Take one capsule by mouth twice daily

## 2013-09-28 ENCOUNTER — Other Ambulatory Visit: Payer: Self-pay | Admitting: Internal Medicine

## 2013-10-01 ENCOUNTER — Ambulatory Visit (INDEPENDENT_AMBULATORY_CARE_PROVIDER_SITE_OTHER): Payer: BC Managed Care – PPO | Admitting: Cardiology

## 2013-10-01 ENCOUNTER — Encounter: Payer: Self-pay | Admitting: Cardiology

## 2013-10-01 VITALS — BP 152/70 | HR 85 | Ht 66.0 in | Wt 255.6 lb

## 2013-10-01 DIAGNOSIS — R079 Chest pain, unspecified: Secondary | ICD-10-CM

## 2013-10-01 DIAGNOSIS — I5032 Chronic diastolic (congestive) heart failure: Secondary | ICD-10-CM

## 2013-10-01 DIAGNOSIS — I10 Essential (primary) hypertension: Secondary | ICD-10-CM

## 2013-10-01 DIAGNOSIS — E785 Hyperlipidemia, unspecified: Secondary | ICD-10-CM

## 2013-10-01 DIAGNOSIS — E669 Obesity, unspecified: Secondary | ICD-10-CM

## 2013-10-01 MED ORDER — CARVEDILOL 25 MG PO TABS: 25.0000 mg | ORAL_TABLET | Freq: Two times a day (BID) | ORAL | Status: DC

## 2013-10-01 NOTE — Progress Notes (Signed)
Patient ID: [name], female   DOB: December 19, 1960, 52 y.o.   MRN: [id number] PCP: [name], MD  Chief Complaint:  Chief Complaint  Patient presents with  . Follow-up    C/o occas chest pains and shortness of breath, lft thigh pain.   Clinic Note: HPI: [name] is a 52 y.o. female with a PMH below who presents today for ~6 months followup.  I last saw her after her Right and Left Heart Catheterization that was done to fully delineate her coronary anatomy and to evaluate her pulmonary pressures. Basically he is relatively unrevealing and her coronaries looked fine right ventricular pressures were in the low to mid 30s, wedge pressure was low teens and LVEDP was roughly 20. There is no clear cardiac etiology for her chest discomfort that she had. These findings correlate with the echocardiogram and LexiScan Myoview that were done.  Interval History: He comes in today overall feeling quite well. She has a standard baseline tightness in her chest occurs usually at rest this is also associated with severe dyspnea. But very similar to was evaluated with a cath in the past. Eyes reveal significant exertion related chest tightness or pressure. Mild intermittent edema, but otherwise no real significant edema. She is now stable daily dose of Lasix, but does take additional doses if need be. She has been working really hard to lose weight has lost 11 pounds since last visit. Mostly dietary modification. No PND or orthopnea. She denies any palpitations or irregular heartbeats, no significant near syncope. No TIA or amaurosis fugax symptoms. No significant change to her chronic coughing, but denies significant wheezing either. She has baseline fatigue.   Past Medical History  Diagnosis Date  . DIABETES MELLITUS, TYPE II 08/21/2006  . HYPERLIPIDEMIA 08/21/2006  . GOUT 08/20/2010  . OBESITY 06/04/2009  . ANEMIA-UNSPECIFIED 09/18/2009  . HYPERTENSION 08/21/2006  . GERD 08/21/2006    . SLEEP APNEA 04/21/2009    last testing was negative  . Internal hemorrhoids   . Pulmonary sarcoidosis     Followed locally by pulmonology, but also by Dr. Sandy Salaam at [facility]  . Exertional chest pain     sharp, substernal, exertional  . Vocal cord dysfunction   . Chronic diastolic heart failure, NYHA class 2     LVEDP roughly 20% by cath   Prior Cardiac Evaluation and Past Surgical History: Past Surgical History  Procedure Laterality Date  . Ventral hernia repair    . Nissen fundoplication  2004  . Cholecystectomy  1984  . Abdominal hysterectomy    . Knee arthroscopy      right  . Tubal ligation      with reversal in 1994  . Bladder suspension  11/11/2011    Procedure: TRANSVAGINAL TAPE (TVT) PROCEDURE;  Surgeon: Levi Aland, MD;  Location: [facility];  Service: Gynecology;  Laterality: N/A;  . Cystoscopy  11/11/2011    Procedure: CYSTOSCOPY;  Surgeon: Levi Aland, MD;  Location: [facility];  Service: Gynecology;  Laterality: N/A;  . Doppler echocardiography  02/12/2013    LV FUNCTION, SIZE NORMAL; MILD CONCENTRIC LVH; EST EF 55-65%; WALL MOTION NORMAL  . Lexiscan myoview  03/09/2013    EF 50%; NORMAL MYOCARDIAL PERFUSION STUDY - breast attenuation  . Cardiac catheterization  07/2010    LVEF 50-55% WITH VERY MILD GLOBAL HYPOKINESIA; ESSENTIALLY NORMAL CORONARY ARTERIES; NORMAL LV FUNCTION  . Carotids  02/18/11    CAROTID DUPLEX; VERTEBRALS ARE PATENT WITH ANTEGRADE  FLOW. ICA/CCA RATIO 1.61 ON RIGHT AND 0.75 ON LEFT  . Right and left cardiac catheterization  04/23/2013    Angiographic normal coronaries; LVEDP 20 mmHg, PCWP 12-14 mmHg, RAP 12 mmHg.; Fick CO/CI 4.9/2.2    Allergies  Allergen Reactions  . Lisinopril Cough  . Methotrexate     REACTION: peri-oral and buccal lesions.  . Clindamycin/Lincomycin Nausea And Vomiting and Rash    Current Outpatient Prescriptions  Medication Sig Dispense Refill  . albuterol (PROVENTIL HFA;VENTOLIN HFA) 108 (90 BASE)  MCG/ACT inhaler Inhale 2 puffs into the lungs every 6 (six) hours as needed. For wheezing.      [name] albuterol (PROVENTIL) (5 MG/ML) 0.5% nebulizer solution Take 2.5 mg by nebulization every 6 (six) hours as needed. For wheezing.      [name] allopurinol (ZYLOPRIM) 100 MG tablet Take 100 mg by mouth at bedtime.       [name] aspirin EC 81 MG tablet Take 81 mg by mouth at bedtime.       [name] atorvastatin (LIPITOR) 40 MG tablet Take 40 mg by mouth at bedtime.      . B-D ULTRAFINE III SHORT PEN 31G X 8 MM MISC Follow directions provided by physician  30 each  2  . benzonatate (TESSALON) 200 MG capsule Take 1 capsule (200 mg total) by mouth 3 (three) times daily.  20 capsule  0  . buPROPion (WELLBUTRIN XL) 300 MG 24 hr tablet Take 300 mg by mouth daily.      . cholecalciferol (VITAMIN D) 1000 UNITS tablet Take 1,000 Units by mouth 2 (two) times daily.      . ciclesonide (OMNARIS) 50 MCG/ACT nasal spray Place 1 spray into both nostrils daily.       . cloNIDine (CATAPRES) 0.1 MG tablet Take 0.1 mg by mouth daily.       [name] DEXILANT 60 MG capsule Take one capsule by mouth one time daily  30 capsule  5  . diltiazem (CARDIZEM CD) 120 MG 24 hr capsule Take 1 capsule (120 mg total) by mouth every evening.  30 capsule  11  . estradiol (ESTRACE) 2 MG tablet Take 2 mg by mouth at bedtime.       . fenofibrate (TRICOR) 145 MG tablet Take 145 mg by mouth daily.      . furosemide (LASIX) 40 MG tablet Take 1 tablet (40 mg total) by mouth 2 (two) times daily. Take additional 40 mg in AM of 7/15 & 7/16 - to shoot for dry wgt of ~2 lb below current weight.  45 tablet  12  . glyBURIDE (DIABETA) 2.5 MG tablet TAKE 1 TABLET BY MOUTH DAILY WITH BREAKFAST.  30 tablet  2  . insulin glargine (LANTUS) 100 UNIT/ML injection Inject 23 Units into the skin at bedtime.      [name] LORazepam (ATIVAN) 1 MG tablet Take 1 mg by mouth 2 (two) times daily as needed for anxiety.      [name] losartan (COZAAR) 100 MG tablet Take 100 mg by mouth at bedtime.      .  metFORMIN (GLUCOPHAGE) 500 MG tablet Take 500 mg by mouth 2 (two) times daily with a meal.      . mometasone-formoterol (DULERA) 200-5 MCG/ACT AERO Inhale 2 puffs into the lungs 2 (two) times daily.  1 Inhaler  6  . nitroGLYCERIN (NITROSTAT) 0.4 MG SL tablet Place 0.4 mg under the tongue every 5 (five) minutes as needed. x3 doses as needed for chest pain.      [name]  omeprazole (PRILOSEC) 20 MG capsule Take 1 capsule (20 mg total) by mouth 2 (two) times daily before a meal.  90 capsule  1  . potassium chloride SA (K-DUR,KLOR-CON) 20 MEQ tablet Take one tablet by mouth three times daily  90 tablet  4  . predniSONE (DELTASONE) 20 MG tablet 4 tabs PO daily x 3 days then 3 tabs PO daily x 3 days then 2 tabs PO daily x 3 days then back to 10mg  PO daily maintenance  30 tablet  0  . predniSONE (DELTASONE) 5 MG tablet Take 10 mg by mouth every morning.       . venlafaxine XR (EFFEXOR-XR) 75 MG 24 hr capsule Take one capsule by mouth twice daily  60 capsule  4  . azithromycin (ZITHROMAX) 250 MG tablet Take 1 tablet by mouth on monday, wednesday, and friday as directed.      . carvedilol (COREG) 25 MG tablet Take 1 tablet (25 mg total) by mouth 2 (two) times daily with a meal.  180 tablet  3  . chlorpheniramine-HYDROcodone (TUSSIONEX) 10-8 MG/5ML LQCR Take 5 mLs by mouth every 12 (twelve) hours as needed. For cough.  240 mL  0   No current facility-administered medications for this visit.   actually only taking 25 mg of losartan  History   Social History  . Marital Status: Married    Spouse Name: AKSHARA BLUMENTHAL    Number of Children: 2  . Years of Education: 12   Occupational History  . DISABLED    Social History Main Topics  . Smoking status: Never Smoker   . Smokeless tobacco: Never Used  . Alcohol Use: No  . Drug Use: No  . Sexual Activity: Yes   Other Topics Concern  . Not on file   Social History Narrative  . No narrative on file   ROS: A comprehensive Review of Systems - Negative  except pertinent positives as above.  PHYSICAL EXAM BP 152/70  Pulse 85  Ht 5\' 6"  (1.676 m)  Wt 255 lb 9.6 oz (115.939 kg)  BMI 41.27 kg/m2 General appearance: alert, cooperative, appears stated age, no distress and morbidly obese  Neck: no carotid bruit, supple, symmetrical, trachea midline, thyroid not enlarged, symmetric, no tenderness/mass/nodules and Unable to really assess JVD due to her body habitus was obese neck.  Lungs: clear to auscultation bilaterally, normal percussion bilaterally and Nonlabored  Heart: regular rate and rhythm, S1, S2 normal, no murmur, click, rub or gallop and Unable to palpate apical impulse  Abdomen: soft, non-tender; bowel sounds normal; no masses, no organomegaly and Significant truncal obesity, unable to palpate HSM or determine HJR  Extremities: edema 1-2+ bilaterally, varicose veins noted and No erythema or rash. Small healing stasis ulcers  Pulses: 2+ and symmetric  EKG: NSR, heart rate 85 bpm, nonspecific ST-T changes.  ASSESSMENT: Deciliter invasive evaluation metered her noninvasive evaluation. There is no clear cardiac etiology for symptoms. Her dyspnea is clearly multifactorial, partly related to basilar lung disease with sarcoidosis, as well as obesity Possible obesity ventilation syndrome. She seems to feel better when she has been adequately diuresed and close her dry weight. Based on the cath results, I want her to increase her Lasix some with the potential for sliding scale Lasix as described last visit.  HYPERTENSION - Plan: EKG 12-Lead  Chronic diastolic heart failure, NYHA class 2 - Plan: EKG 12-Lead  OBESITY - Plan: EKG 12-Lead  HYPERLIPIDEMIA - Plan: EKG 12-Lead  Chest pain - at rest,  and with exertion; -- angiographically normal coronary arteries, mildly elevated LVEDP - Plan: EKG 12-Lead  PLAN: HYPERTENSION Blood pressure is up a little bit today. Not sure why she is on atenolol plus carvedilol. Possibly increased carvedilol  25 twice a day and stop the atenolol. Hopefully this will help improve blood pressure control, and stream on medications.  Chronic diastolic heart failure, NYHA class 2 Increasing afterload with carvedilol. A standard dose of Lasix. Already on max ARB dose for afterload reduction. Using sliding scale Lasix as discussed before.  OBESITY She seems to be working hard for weight loss. Has lost 11 pounds since October. I encouraged her to continue doing what she's doing with increasing activity and dietary modification.  HYPERLIPIDEMIA On statin plus fenofibrate. Monitored by PCP.  Chest pain - at rest, and with exertion; -- angiographically normal coronary arteries, mildly elevated LVEDP My general talk to her chest discomfort is that this is related to musculoskeletal issues, but cannot exclude sarcoid component. Her cardiac workup is relatively benign.    Followup: 6 months for reassessment.  Marykay Lex, M.D., M.S. [facility] [address] [place], [place]  [address]  [phone] Pager # [phone] 04/04/2013 12:05 AM

## 2013-10-01 NOTE — Patient Instructions (Signed)
Stop  Atenolol   Increase Coreg 25 mg twice a day  Your physician wants you to follow-up in 6 month Dr Herbie Baltimore.  You will receive a reminder letter in the mail two months in advance. If you don't receive a letter, please call our office to schedule the follow-up appointment.

## 2013-10-02 ENCOUNTER — Telehealth: Payer: Self-pay

## 2013-10-02 ENCOUNTER — Encounter: Payer: Self-pay | Admitting: Adult Health

## 2013-10-02 ENCOUNTER — Ambulatory Visit (INDEPENDENT_AMBULATORY_CARE_PROVIDER_SITE_OTHER)
Admission: RE | Admit: 2013-10-02 | Discharge: 2013-10-02 | Disposition: A | Discharge status: Home / Self Care | Payer: BC Managed Care – PPO | Source: Ambulatory Visit | Attending: Adult Health | Admitting: Adult Health

## 2013-10-02 ENCOUNTER — Ambulatory Visit (INDEPENDENT_AMBULATORY_CARE_PROVIDER_SITE_OTHER): Payer: BC Managed Care – PPO | Admitting: Adult Health

## 2013-10-02 VITALS — BP 124/64 | HR 95 | Temp 99.3°F | Ht 66.5 in | Wt 253.8 lb

## 2013-10-02 DIAGNOSIS — D869 Sarcoidosis, unspecified: Secondary | ICD-10-CM | POA: Diagnosis not present

## 2013-10-02 DIAGNOSIS — J441 Chronic obstructive pulmonary disease with (acute) exacerbation: Secondary | ICD-10-CM | POA: Diagnosis not present

## 2013-10-02 DIAGNOSIS — J99 Respiratory disorders in diseases classified elsewhere: Secondary | ICD-10-CM | POA: Diagnosis not present

## 2013-10-02 DIAGNOSIS — D860 Sarcoidosis of lung: Secondary | ICD-10-CM

## 2013-10-02 MED ORDER — HYDROCOD POLST-CHLORPHEN POLST 10-8 MG/5ML PO LQCR: 5.0000 mL | Freq: Two times a day (BID) | ORAL | Status: DC | PRN

## 2013-10-02 NOTE — Telephone Encounter (Signed)
Message copied by Velvet Bathe on Tue Oct 02, 2013  4:59 PM ------      Message from: Julio Sicks      Created: Tue Oct 02, 2013  3:54 PM       cxr is improved       Cont w/ ov recs       Please contact office for sooner follow up if symptoms do not improve or worsen or seek emergency care        ------

## 2013-10-02 NOTE — Assessment & Plan Note (Signed)
Severe flare now resolving  After hospitalizaiton  Repeat cxr today   Plan  May use Delsym 2 tsp Twice daily   Prednisone 20mg  daily for 1 week then back to 10mg  daily  Tussionex 1 tsp Twice daily  As needed  Severe cough, may make you sleepy.  GERD diet  Refer to Pulmonary rehab.  Chest xray today  follow up Dr. Delford Field  In 4-6 weeks and As needed   Please contact office for sooner follow up if symptoms do not improve or worsen or seek emergency care

## 2013-10-02 NOTE — Progress Notes (Signed)
  Subjective:    Patient ID: [name], female    DOB: 09-Aug-1961, 52 y.o.   MRN: [id number]  HPI  57 WF with Sarcoid, GERD, VCD, cyclical cough     10/02/2013  Post Hospital follow up  Pt returns for post hospital follow up , admitted 12/9-12/11 for Bronchitis exacerbation . Tx w/ IV steroids, abx and Nebs. Aggressive cough suppression for cyclical cough. Strict GERD tx. Discharged on prednisone burst. On chronic prednisone 10mg  daily .  Since discharge she is feeling improved . Finished Levaquin last week, almost done with prednisone 20mg  to resume 10mg  QD.  taking zithromax M[facility] from [facility].  She denies hemoptysis, chest pain , orthopnea, edema or fever. Cough is better , using tussionex . Does still have dry cough on/off.       Review of Systems  Constitutional:   No  weight loss, night sweats,  Fevers, chills, fatigue, or  lassitude.  HEENT:   No headaches,  Difficulty swallowing,  Tooth/dental problems, or  Sore throat,                No sneezing, itching, ear ache,  +nasal congestion, post nasal drip,   CV:  No chest pain,  Orthopnea, PND, swelling in lower extremities, anasarca, dizziness, palpitations, syncope.   GI  No heartburn, indigestion, abdominal pain, nausea, vomiting, diarrhea, change in bowel habits, loss of appetite, bloody stools.   Resp:  ,  No coughing up of blood.   [name]  No chest wall deformity  Skin: no rash or lesions.  GU: no dysuria, change in color of urine, no urgency or frequency.  No flank pain, no hematuria   MS:  No joint pain or swelling.  No decreased range of motion.  No back pain.  Psych:  No change in mood or affect. No depression or anxiety.  No memory loss.    '    Objective:   Physical Exam  GEN: A/Ox3; pleasant , NAD   HEENT:  [place]/AT,  EACs-clear, TMs-wnl, NOSE-clear drainage THROAT-clear, no lesions, no postnasal drip or exudate noted.   NECK:  Supple w/ fair ROM; no JVD; normal carotid impulses w/o bruits; no  thyromegaly or nodules palpated; no lymphadenopathy.  RESP  CTA  w/o, wheezes/ rales/ or rhonchi.no accessory muscle use, no dullness to percussion  CARD:  RRR, no m/r/g  , no peripheral edema, pulses intact, no cyanosis or clubbing.  GI:   Soft & nt; nml bowel sounds; no organomegaly or masses detected.  Musco: Warm bil, no deformities or joint swelling noted.   Neuro: alert, no focal deficits noted.    Skin: Warm, no lesions or rashes         Assessment & Plan:

## 2013-10-02 NOTE — Telephone Encounter (Signed)
Called pt with cxr results.  She is aware.  Nothing further needed at this time. Caulfield,Ashley L

## 2013-10-02 NOTE — Patient Instructions (Signed)
May use Delsym 2 tsp Twice daily   Prednisone 20mg  daily for 1 week then back to 10mg  daily  Tussionex 1 tsp Twice daily  As needed  Severe cough, may make you sleepy.  GERD diet  Refer to Pulmonary rehab.  Chest xray today  follow up Dr. Delford Field  In 4-6 weeks and As needed   Please contact office for sooner follow up if symptoms do not improve or worsen or seek emergency care

## 2013-10-07 ENCOUNTER — Encounter: Payer: Self-pay | Admitting: Cardiology

## 2013-10-07 NOTE — Assessment & Plan Note (Signed)
Blood pressure is up a little bit today. Not sure why she is on atenolol plus carvedilol. Possibly increased carvedilol 25 twice a day and stop the atenolol. Hopefully this will help improve blood pressure control, and stream on medications.

## 2013-10-07 NOTE — Assessment & Plan Note (Signed)
Increasing afterload with carvedilol. A standard dose of Lasix. Already on max ARB dose for afterload reduction. Using sliding scale Lasix as discussed before.

## 2013-10-07 NOTE — Assessment & Plan Note (Addendum)
She seems to be working hard for weight loss. Has lost 11 pounds since October. I encouraged her to continue doing what she's doing with increasing activity and dietary modification.

## 2013-10-07 NOTE — Assessment & Plan Note (Signed)
On statin plus fenofibrate. Monitored by PCP.

## 2013-10-07 NOTE — Assessment & Plan Note (Signed)
My general talk to her chest discomfort is that this is related to musculoskeletal issues, but cannot exclude sarcoid component. Her cardiac workup is relatively benign.

## 2013-10-10 ENCOUNTER — Ambulatory Visit: Payer: BC Managed Care – PPO | Admitting: Internal Medicine

## 2013-10-11 LAB — HM MAMMOGRAPHY

## 2013-10-11 LAB — HM DIABETES EYE EXAM

## 2013-10-15 ENCOUNTER — Ambulatory Visit: Payer: BC Managed Care – PPO | Admitting: Critical Care Medicine

## 2013-10-19 ENCOUNTER — Telehealth: Payer: Self-pay | Admitting: Internal Medicine

## 2013-10-19 ENCOUNTER — Other Ambulatory Visit: Payer: Self-pay | Admitting: Internal Medicine

## 2013-10-19 MED ORDER — GLUCOSE BLOOD VI STRP: 1.0000 | ORAL_STRIP | Freq: Three times a day (TID) | Status: DC

## 2013-10-19 NOTE — Telephone Encounter (Signed)
Referral order placed.

## 2013-10-19 NOTE — Telephone Encounter (Signed)
Pt needs new rx one touch ultra test strips call into [facility] on [address]

## 2013-10-23 DIAGNOSIS — D869 Sarcoidosis, unspecified: Secondary | ICD-10-CM | POA: Diagnosis not present

## 2013-10-26 IMAGING — CR DG CHEST 1V PORT
1 series · 1 of 1 positions shown · non-contrast
Comparison: Prior chest x-ray 05/26/2012

CLINICAL DATA: Short of breath

PORTABLE CHEST - 1 VIEW

[AP]
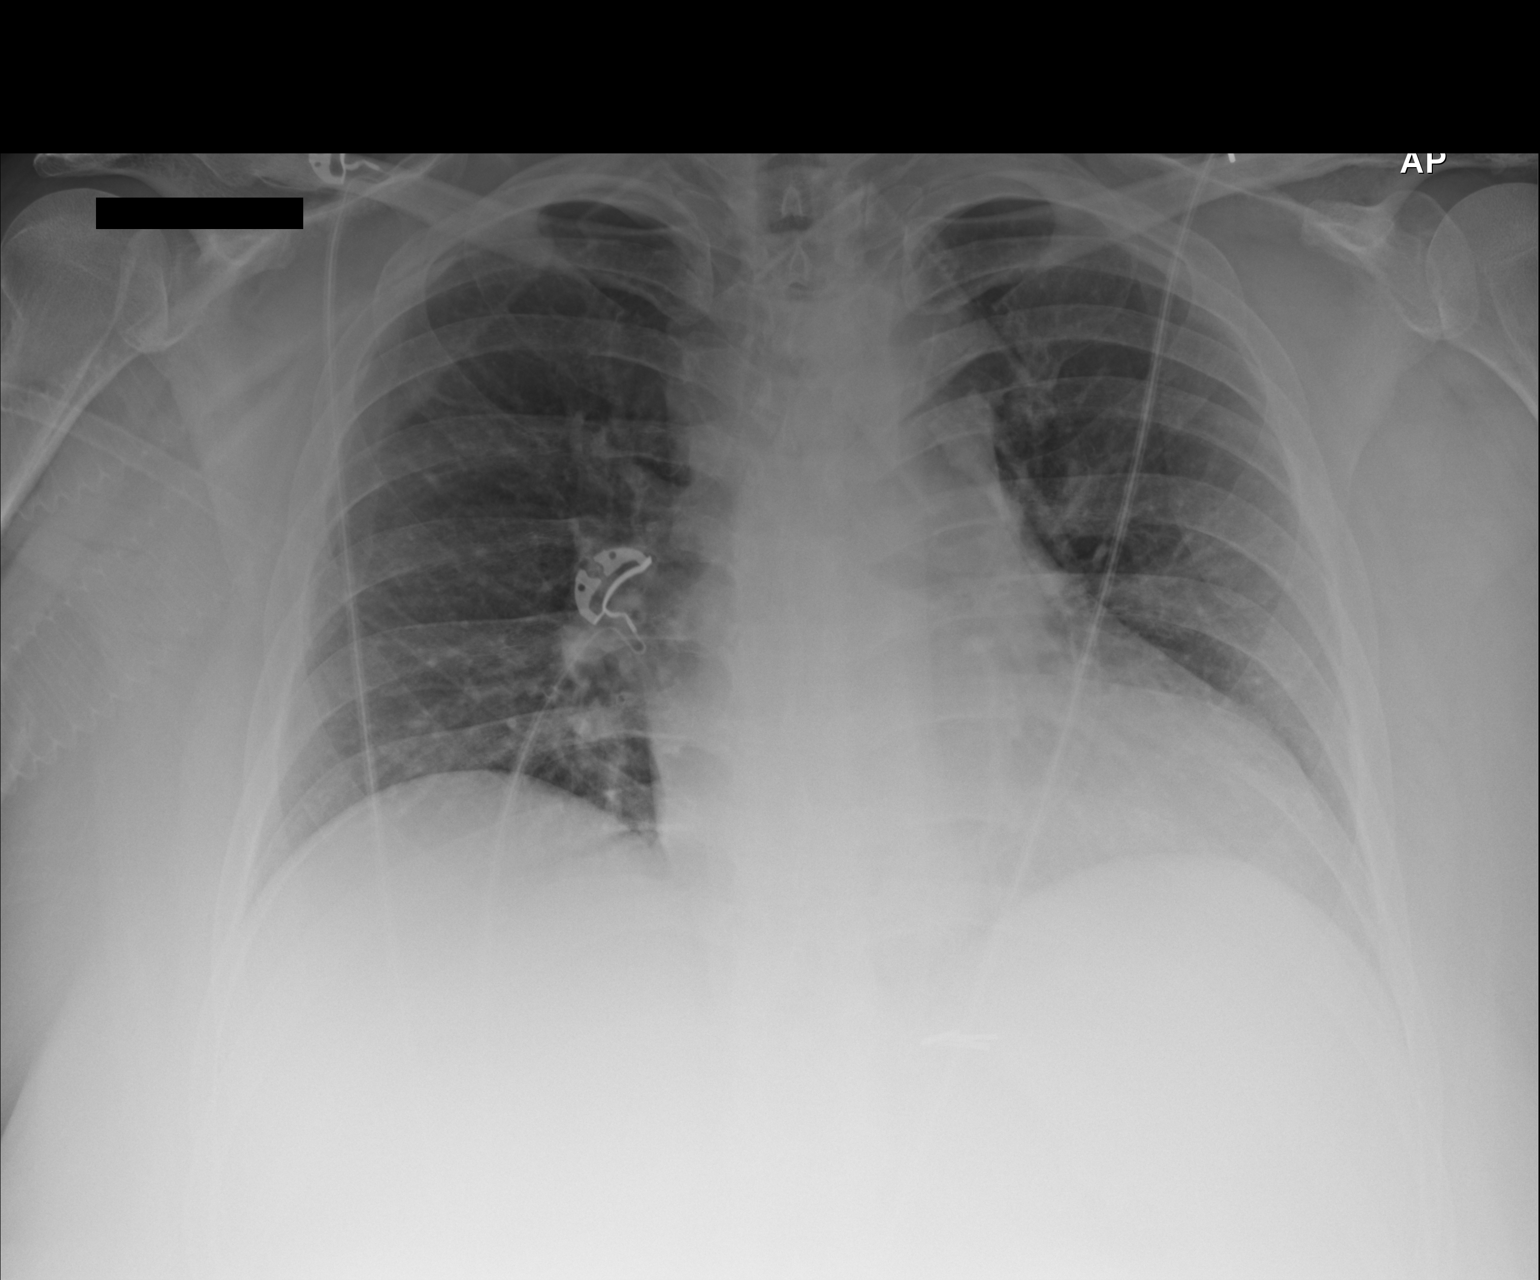

[1 of 1 positions shown; findings below may reference images not displayed]

FINDINGS: Lungs are well-aerated.  No edema, focal consolidation,
pleural effusion or pneumothorax.  Cardiac and mediastinal contours
remain within normal limits.  No acute osseous abnormality.
Surgical clips project over the GE junction.
IMPRESSION: No acute cardiopulmonary disease.

## 2013-10-27 IMAGING — CT CT ANGIO CHEST
2 of 8 series · 13 of 36 positions shown · IV contrast (CONTRAST)
Comparison: Plain film 12/16/2012.  CT 12/30/2011

CLINICAL DATA: Chest pain and shortness of breath.

CT ANGIOGRAPHY CHEST
TECHNIQUE: Multidetector CT imaging of the chest using the
standard protocol during bolus administration of intravenous
contrast. Multiplanar reconstructed images including MIPs were
obtained and reviewed to evaluate the vascular anatomy.
Contrast: 70mL OMNIPAQUE IOHEXOL 350 MG/ML SOLN

[Series 5: pe thins · axial · 0.75mm/px · z∈[-245,-16]mm · 12 of 271 slices shown]
[im 21/271  lung]
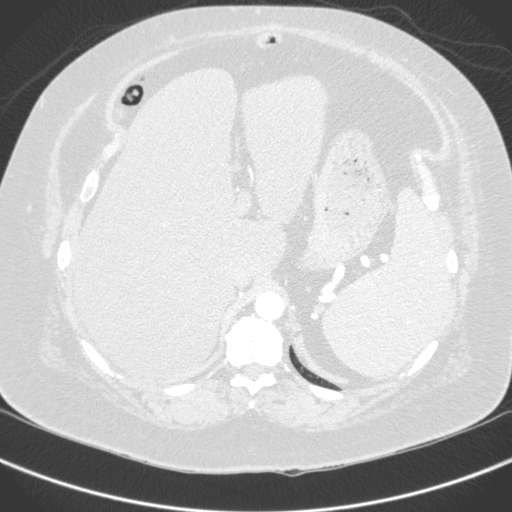
[im 42/271  mediastinal]
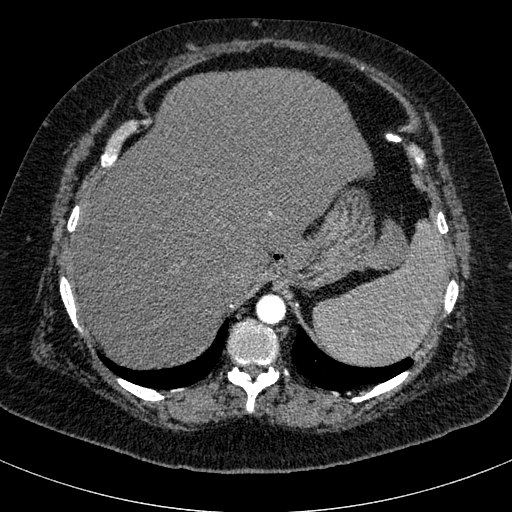
[im 63/271  lung]
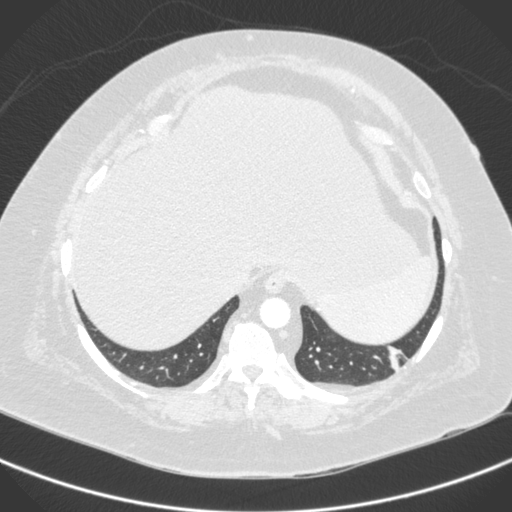
[im 84/271  mediastinal]
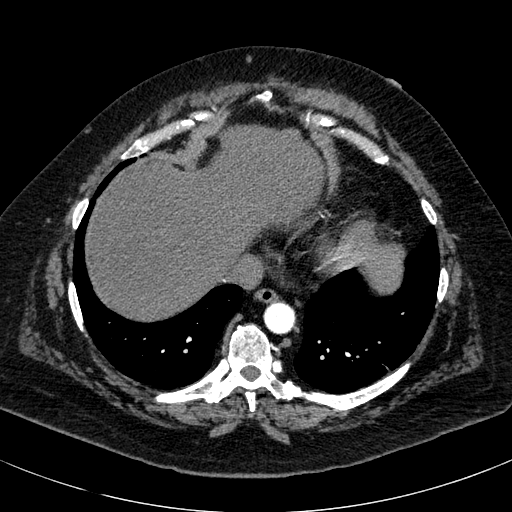
[im 104/271  lung]
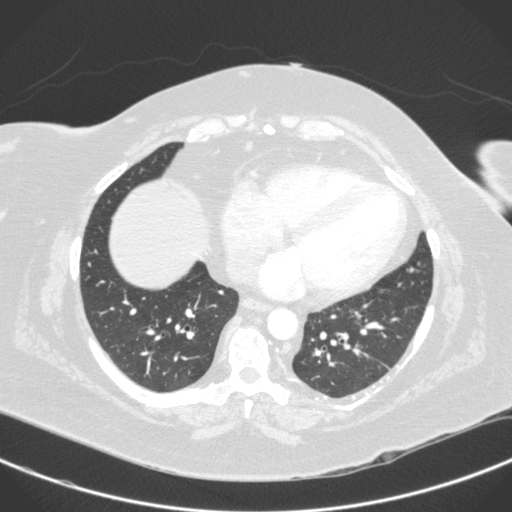
[im 125/271  mediastinal]
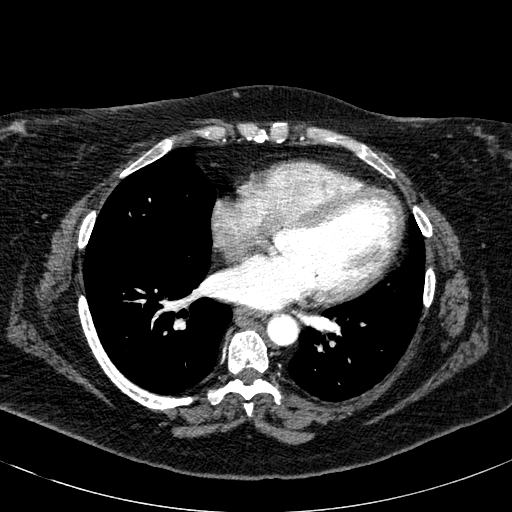
[im 146/271  lung]
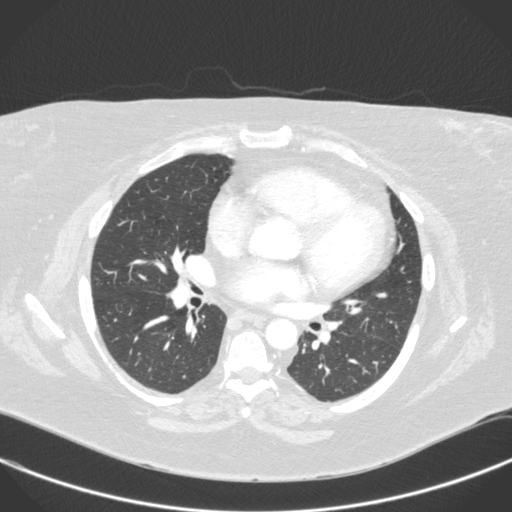
[im 167/271  mediastinal]
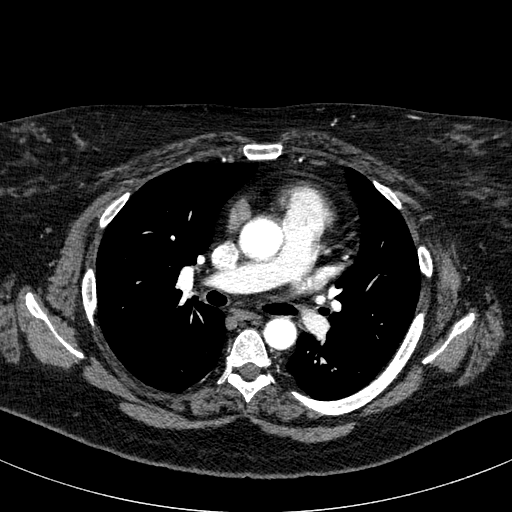
[im 187/271  lung]
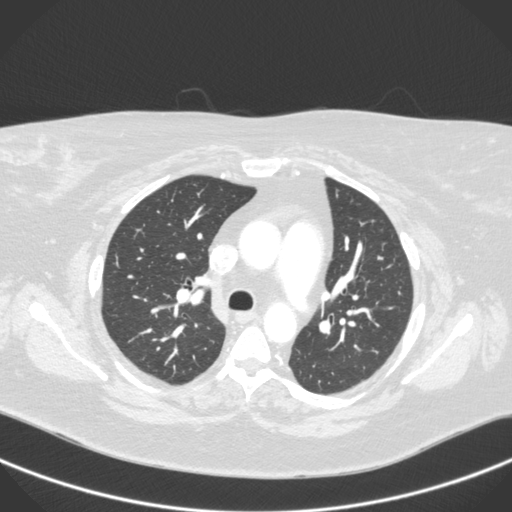
[im 208/271  mediastinal]
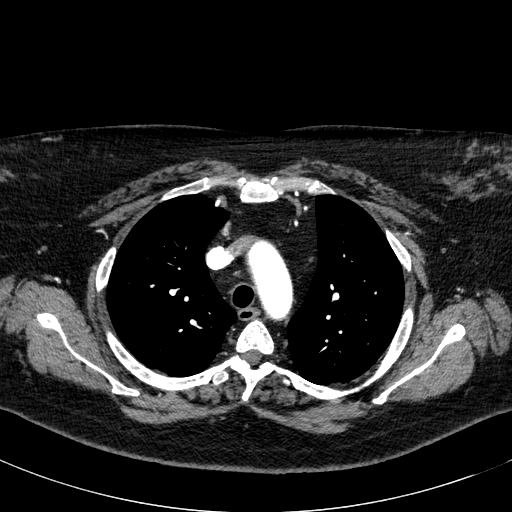
[im 229/271  lung]
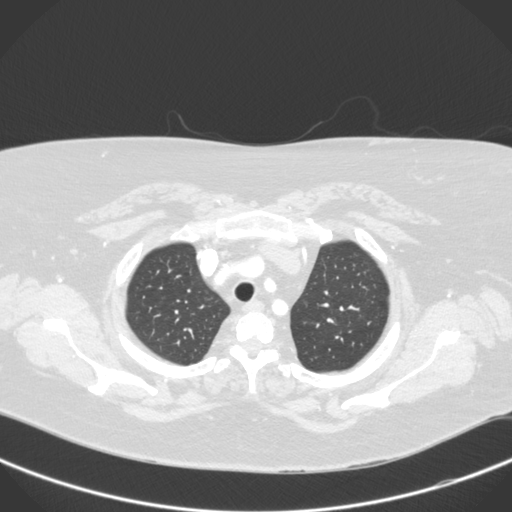
[im 250/271  mediastinal]
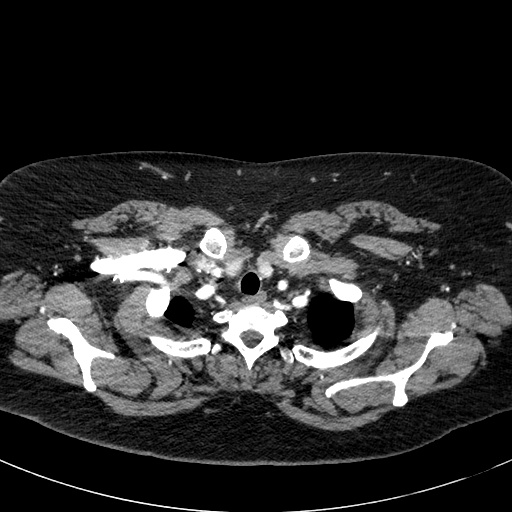

[mpr, coronals, coronal · coronal · 0.75mm/px · 1 of 143 slices shown]
[im 72/143  mediastinal]
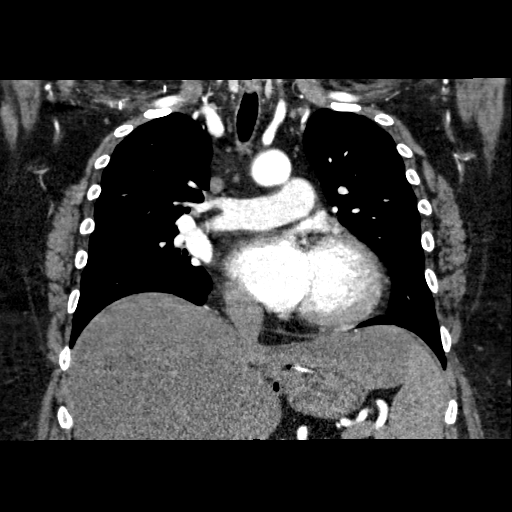

[13 of 36 positions shown; findings below may reference images not displayed]

FINDINGS: Lung windows demonstrate scarring or atelectasis at the
left lung base. No lobar consolidation.

Soft tissue windows:  The quality of this exam for evaluation of
pulmonary embolism is moderate.  Bolus is somewhat early with
contrast centered in the aorta.  No evidence of pulmonary embolism
to the large segmental level.

Normal aortic caliber without dissection.  Mild cardiomegaly.  LAD
coronary artery atherosclerosis. No pericardial or pleural
effusion.  Mild enlargement of the pulmonary outflow tract at
cm. No mediastinal or hilar adenopathy.

Limited abdominal imaging demonstrates hepatic steatosis and
hepatomegaly.

No acute osseous abnormality.
IMPRESSION: 1. No evidence of pulmonary embolism. See mild limitations above.
2. Age advanced coronary artery atherosclerosis.  Recommend
assessment of coronary risk factors and consideration of medical
therapy.
3.  Pulmonary artery enlargement suggests pulmonary arterial
hypertension.
4.  Hepatic steatosis and hepatomegaly.

## 2013-10-28 DIAGNOSIS — R5381 Other malaise: Secondary | ICD-10-CM | POA: Diagnosis not present

## 2013-10-28 DIAGNOSIS — R0982 Postnasal drip: Secondary | ICD-10-CM | POA: Diagnosis not present

## 2013-10-28 DIAGNOSIS — J019 Acute sinusitis, unspecified: Secondary | ICD-10-CM | POA: Diagnosis not present

## 2013-10-28 DIAGNOSIS — R112 Nausea with vomiting, unspecified: Secondary | ICD-10-CM | POA: Diagnosis not present

## 2013-10-31 ENCOUNTER — Encounter: Payer: Self-pay | Admitting: Critical Care Medicine

## 2013-10-31 ENCOUNTER — Ambulatory Visit (INDEPENDENT_AMBULATORY_CARE_PROVIDER_SITE_OTHER): Payer: BC Managed Care – PPO | Admitting: Critical Care Medicine

## 2013-10-31 VITALS — BP 138/66 | HR 79 | Temp 98.0°F | Ht 66.5 in | Wt 261.0 lb

## 2013-10-31 DIAGNOSIS — J441 Chronic obstructive pulmonary disease with (acute) exacerbation: Secondary | ICD-10-CM | POA: Diagnosis not present

## 2013-10-31 NOTE — Patient Instructions (Addendum)
No change in medications. Return in   3 months 

## 2013-10-31 NOTE — Progress Notes (Signed)
Subjective:    Patient ID: [name], female    DOB: [birth year], 53 y.o.   MRN: [id number]  HPI  53 y.o. F with Sarcoid, GERD, VCD, cyclical cough   9/50/9326 Chief Complaint  Patient presents with  . Follow-up    Pt c/o SOB with exertion, nonprod cough X3 days, sinus congestion and postnasal drip.    Pt still with cough and dyspnea, also flu like illness.  Pt did not have fever.  Pt with emesis/ nausea and aching. Pt notes sinus congestion and pndrip.  Notes some wheezing   Pt in hosp adm 12/9 d/c 09/20/13.  rx abx and steroids.   Pt now on axithromycin 250mg  MWF per [facility] at [facility]. Now taking daily   On pred 10/d  On dulerat 200 two puff bid Plan per [facility] is to withdraw pred if CT Chest ok in 3-4 months Pfts not yet done    Review of Systems  Constitutional:   No  weight loss, night sweats,  Fevers, chills, fatigue, or  lassitude.  HEENT:   No headaches,  Difficulty swallowing,  Tooth/dental problems, or  Sore throat,                No sneezing, itching, ear ache,  +nasal congestion, post nasal drip,   CV:  No chest pain,  Orthopnea, PND, swelling in lower extremities, anasarca, dizziness, palpitations, syncope.   GI  No heartburn, indigestion, abdominal pain, nausea, vomiting, diarrhea, change in bowel habits, loss of appetite, bloody stools.   Resp:  ,  No coughing up of blood.   [name]  No chest wall deformity  Skin: no rash or lesions.  GU: no dysuria, change in color of urine, no urgency or frequency.  No flank pain, no hematuria   MS:  No joint pain or swelling.  No decreased range of motion.  No back pain.  Psych:  No change in mood or affect. No depression or anxiety.  No memory loss.     Objective:   Physical Exam BP 138/66  Pulse 79  Temp(Src) 98 F (36.7 C) (Oral)  Ht 5' 6.5" (1.689 m)  Wt 261 lb (118.389 kg)  BMI 41.50 kg/m2  SpO2 98%  GEN: A/Ox3; pleasant , NAD   HEENT:  [place]/AT,  EACs-clear, TMs-wnl, NOSE-clear drainage THROAT-clear, no  lesions, no postnasal drip or exudate noted.   NECK:  Supple w/ fair ROM; no JVD; normal carotid impulses w/o bruits; no thyromegaly or nodules palpated; no lymphadenopathy.  RESP  CTA  w/o, wheezes/ rales/ or rhonchi.no accessory muscle use, no dullness to percussion  CARD:  RRR, no m/r/g  , no peripheral edema, pulses intact, no cyanosis or clubbing.  GI:   Soft & nt; nml bowel sounds; no organomegaly or masses detected.  Musco: Warm bil, no deformities or joint swelling noted.   Neuro: alert, no focal deficits noted.    Skin: Warm, no lesions or rashes      Assessment & Plan:   Obstructive chronic bronchitis without exacerbation Asthmatic bronchitis with recent exacerbation and associated sarcoidosis now improved Plan Maintain inhaled medications as prescribed No additional antibiotics indicated Maintain prednisone 10 mg daily   Updated Medication List Outpatient Encounter Prescriptions as of 10/31/2013  Medication Sig  . albuterol (PROVENTIL HFA;VENTOLIN HFA) 108 (90 BASE) MCG/ACT inhaler Inhale 2 puffs into the lungs every 6 (six) hours as needed. For wheezing.  [name] albuterol (PROVENTIL) (5 MG/ML) 0.5% nebulizer solution Take 2.5 mg by nebulization every 6 (  six) hours as needed. For wheezing.  [name] allopurinol (ZYLOPRIM) 100 MG tablet Take 100 mg by mouth at bedtime.   [name] aspirin EC 81 MG tablet Take 81 mg by mouth at bedtime.   [name] atorvastatin (LIPITOR) 40 MG tablet Take 40 mg by mouth at bedtime.  [name] azithromycin (ZITHROMAX) 250 MG tablet once.   . B-D ULTRAFINE III SHORT PEN 31G X 8 MM MISC Follow directions provided by physician  . benzonatate (TESSALON) 200 MG capsule Take 1 capsule (200 mg total) by mouth 3 (three) times daily.  [name] buPROPion (WELLBUTRIN XL) 300 MG 24 hr tablet Take 300 mg by mouth daily.  . carvedilol (COREG) 25 MG tablet Take 1 tablet (25 mg total) by mouth 2 (two) times daily with a meal.  . chlorpheniramine-HYDROcodone (TUSSIONEX) 10-8 MG/5ML LQCR Take 5  mLs by mouth every 12 (twelve) hours as needed. For cough.  . cholecalciferol (VITAMIN D) 1000 UNITS tablet Take 1,000 Units by mouth 2 (two) times daily.  . ciclesonide (OMNARIS) 50 MCG/ACT nasal spray Place 1 spray into both nostrils daily.   . cloNIDine (CATAPRES) 0.1 MG tablet Take 0.1 mg by mouth daily.   [name] DEXILANT 60 MG capsule Take one capsule by mouth one time daily  . diltiazem (CARDIZEM CD) 120 MG 24 hr capsule Take 1 capsule (120 mg total) by mouth every evening.  [name] estradiol (ESTRACE) 2 MG tablet Take 2 mg by mouth at bedtime.   . fenofibrate (TRICOR) 145 MG tablet Take 145 mg by mouth daily.  [name] glucose blood (ONE TOUCH ULTRA TEST) test strip 1 each by Other route 3 (three) times daily. Use as instructed  . glyBURIDE (DIABETA) 2.5 MG tablet TAKE 1 TABLET BY MOUTH DAILY WITH BREAKFAST.  [name] insulin glargine (LANTUS) 100 UNIT/ML injection Inject 23 Units into the skin at bedtime.  [name] LORazepam (ATIVAN) 1 MG tablet Take 1 mg by mouth 2 (two) times daily as needed for anxiety.  [name] losartan (COZAAR) 100 MG tablet Take 100 mg by mouth at bedtime.  . metFORMIN (GLUCOPHAGE) 500 MG tablet Take 500 mg by mouth 2 (two) times daily with a meal.  . mometasone-formoterol (DULERA) 200-5 MCG/ACT AERO Inhale 2 puffs into the lungs 2 (two) times daily.  . nitroGLYCERIN (NITROSTAT) 0.4 MG SL tablet Place 0.4 mg under the tongue every 5 (five) minutes as needed. x3 doses as needed for chest pain.  [name] omeprazole (PRILOSEC) 20 MG capsule Take 1 capsule (20 mg total) by mouth 2 (two) times daily before a meal.  . potassium chloride SA (K-DUR,KLOR-CON) 20 MEQ tablet Take one tablet by mouth three times daily  . predniSONE (DELTASONE) 5 MG tablet Take 10 mg by mouth every morning.   . venlafaxine XR (EFFEXOR-XR) 75 MG 24 hr capsule Take one capsule by mouth twice daily  . furosemide (LASIX) 40 MG tablet Take 1 tablet (40 mg total) by mouth 2 (two) times daily. Take additional 40 mg in AM of 7/15 & 7/16 - to shoot  for dry wgt of ~2 lb below current weight.  . [DISCONTINUED] predniSONE (DELTASONE) 20 MG tablet 4 tabs PO daily x 3 days then 3 tabs PO daily x 3 days then 2 tabs PO daily x 3 days then back to 10mg  PO daily maintenance

## 2013-11-02 NOTE — Assessment & Plan Note (Addendum)
Asthmatic bronchitis with recent exacerbation and associated sarcoidosis now improved Plan Maintain inhaled medications as prescribed No additional antibiotics indicated Maintain prednisone 10 mg daily

## 2013-11-04 ENCOUNTER — Other Ambulatory Visit: Payer: Self-pay | Admitting: Cardiology

## 2013-11-07 ENCOUNTER — Encounter (HOSPITAL_COMMUNITY): Payer: Self-pay

## 2013-11-07 ENCOUNTER — Encounter (HOSPITAL_COMMUNITY)
Admission: RE | Admit: 2013-11-07 | Discharge: 2013-11-07 | Disposition: A | Discharge status: Home / Self Care | Payer: BC Managed Care – PPO | Source: Ambulatory Visit | Attending: Critical Care Medicine | Admitting: Critical Care Medicine

## 2013-11-07 DIAGNOSIS — Z6841 Body Mass Index (BMI) 40.0 and over, adult: Secondary | ICD-10-CM | POA: Diagnosis not present

## 2013-11-07 DIAGNOSIS — E669 Obesity, unspecified: Secondary | ICD-10-CM | POA: Insufficient documentation

## 2013-11-07 DIAGNOSIS — I5032 Chronic diastolic (congestive) heart failure: Secondary | ICD-10-CM | POA: Diagnosis not present

## 2013-11-07 DIAGNOSIS — I509 Heart failure, unspecified: Secondary | ICD-10-CM | POA: Insufficient documentation

## 2013-11-07 DIAGNOSIS — I69939 Monoplegia of upper limb following unspecified cerebrovascular disease affecting unspecified side: Secondary | ICD-10-CM | POA: Insufficient documentation

## 2013-11-07 DIAGNOSIS — E1142 Type 2 diabetes mellitus with diabetic polyneuropathy: Secondary | ICD-10-CM | POA: Diagnosis not present

## 2013-11-07 DIAGNOSIS — E1149 Type 2 diabetes mellitus with other diabetic neurological complication: Secondary | ICD-10-CM | POA: Diagnosis not present

## 2013-11-07 DIAGNOSIS — J99 Respiratory disorders in diseases classified elsewhere: Secondary | ICD-10-CM | POA: Diagnosis not present

## 2013-11-07 DIAGNOSIS — J449 Chronic obstructive pulmonary disease, unspecified: Secondary | ICD-10-CM | POA: Diagnosis not present

## 2013-11-07 DIAGNOSIS — Z5189 Encounter for other specified aftercare: Secondary | ICD-10-CM | POA: Diagnosis not present

## 2013-11-07 DIAGNOSIS — D869 Sarcoidosis, unspecified: Secondary | ICD-10-CM | POA: Insufficient documentation

## 2013-11-07 DIAGNOSIS — J4489 Other specified chronic obstructive pulmonary disease: Secondary | ICD-10-CM | POA: Insufficient documentation

## 2013-11-07 DIAGNOSIS — I10 Essential (primary) hypertension: Secondary | ICD-10-CM | POA: Diagnosis not present

## 2013-11-07 NOTE — Progress Notes (Signed)
Ms. Guarisco arrived for orientation to Pulmonary Rehab. She is accompanied by her husband.  Neatly dressed, pleasant lady, appearing her stated age.  Skin warm and dry, color good.  She is oriented to time and place.  Reviewed with patient and her husband Pulmonary Rehab expectations and goals.  Review of medical history and medications .  Heart rhythm is regular, grips equal and strong.  Breath sounds distant and clear.  Distal pulses present . No peripheral edema noted.  Discussed diabetic status and review of department policy checking CBG pre and post exercise. We also discussed her pre exercise nutrition.  Strong family history of heart disease, reviewed symptoms and NTG use.  Advised to notify staff of any changes or discomfort with exercise.  Walk test scheduled for 11/08/2013 and she will begin exercise on 11/13/13.  We look forward to assisting this nice lady in Pulmonary Rehab and improving her QOL  10:10 am to 11:10 am.   Leverne Humbles RN.

## 2013-11-08 ENCOUNTER — Encounter (HOSPITAL_COMMUNITY): Payer: Self-pay

## 2013-11-08 ENCOUNTER — Encounter (HOSPITAL_COMMUNITY)
Admission: RE | Admit: 2013-11-08 | Discharge: 2013-11-08 | Disposition: A | Discharge status: Home / Self Care | Payer: BC Managed Care – PPO | Source: Ambulatory Visit | Attending: Critical Care Medicine | Admitting: Critical Care Medicine

## 2013-11-08 NOTE — Progress Notes (Signed)
[name] completed a Six-Minute Walk Test on 11/08/13 . [name] walked 1395 feet with 0 breaks.  The patient's lowest oxygen saturation was 94% , highest heart rate was 120bpm, and highest blood pressure was 168/78. The patient was on 0 liters. [name] stated that shortness of breath hindered their walk test.

## 2013-11-09 LAB — GLUCOSE, CAPILLARY: Glucose-Capillary: 189 mg/dL — ABNORMAL HIGH (ref 70–99)

## 2013-11-13 ENCOUNTER — Encounter (HOSPITAL_COMMUNITY)
Admission: RE | Admit: 2013-11-13 | Discharge: 2013-11-13 | Disposition: A | Discharge status: Home / Self Care | Payer: BC Managed Care – PPO | Source: Ambulatory Visit | Attending: Critical Care Medicine | Admitting: Critical Care Medicine

## 2013-11-13 DIAGNOSIS — D869 Sarcoidosis, unspecified: Secondary | ICD-10-CM | POA: Diagnosis not present

## 2013-11-14 ENCOUNTER — Ambulatory Visit (INDEPENDENT_AMBULATORY_CARE_PROVIDER_SITE_OTHER): Payer: BC Managed Care – PPO | Admitting: Family Medicine

## 2013-11-14 ENCOUNTER — Encounter: Payer: Self-pay | Admitting: Family Medicine

## 2013-11-14 VITALS — BP 120/68 | HR 91 | Temp 97.8°F | Wt 260.0 lb

## 2013-11-14 DIAGNOSIS — J329 Chronic sinusitis, unspecified: Secondary | ICD-10-CM | POA: Diagnosis not present

## 2013-11-14 MED ORDER — DOXYCYCLINE HYCLATE 100 MG PO TABS: 100.0000 mg | ORAL_TABLET | Freq: Two times a day (BID) | ORAL | Status: DC

## 2013-11-14 NOTE — Progress Notes (Signed)
Chief Complaint  Patient presents with  . Sinusitis    cough, congestion    HPI:  -started: 3 days ago and worsening -symptoms:nasal congestion, sore throat, cough, max sinus pain and pressure -denies:fever, SOB, NVD, tooth pain, body aches -has tried: cough medicine -sick contacts/travel/risks: denies flu exposure or Ebola risks -Hx of: sarcoid on chronic steroids  ROS: See pertinent positives and negatives per HPI.  Past Medical History  Diagnosis Date  . DIABETES MELLITUS, TYPE II 08/21/2006  . HYPERLIPIDEMIA 08/21/2006  . GOUT 08/20/2010  . OBESITY 06/04/2009  . ANEMIA-UNSPECIFIED 09/18/2009  . HYPERTENSION 08/21/2006  . GERD 08/21/2006  . SLEEP APNEA 04/21/2009    last testing was negative  . Internal hemorrhoids   . Pulmonary sarcoidosis     Followed locally by pulmonology, but also by Dr. Casper Harrison at [facility]  . Exertional chest pain     sharp, substernal, exertional  . Vocal cord dysfunction   . Chronic diastolic heart failure, NYHA class 2     LVEDP roughly 20% by cath  . CHF (congestive heart failure)     Past Surgical History  Procedure Laterality Date  . Ventral hernia repair    . Nissen fundoplication  4098  . Cholecystectomy  1984  . Abdominal hysterectomy    . Knee arthroscopy      right  . Tubal ligation      with reversal in 1994  . Bladder suspension  11/11/2011    Procedure: TRANSVAGINAL TAPE (TVT) PROCEDURE;  Surgeon: Olga Millers, MD;  Location: [facility];  Service: Gynecology;  Laterality: N/A;  . Cystoscopy  11/11/2011    Procedure: CYSTOSCOPY;  Surgeon: Olga Millers, MD;  Location: [facility];  Service: Gynecology;  Laterality: N/A;  . Doppler echocardiography  02/12/2013    LV FUNCTION, SIZE NORMAL; MILD CONCENTRIC LVH; EST EF 55-65%; WALL MOTION NORMAL  . Lexiscan myoview  03/09/2013    EF 50%; NORMAL MYOCARDIAL PERFUSION STUDY - breast attenuation  . Cardiac catheterization  07/2010    LVEF 50-55% WITH VERY MILD GLOBAL  HYPOKINESIA; ESSENTIALLY NORMAL CORONARY ARTERIES; NORMAL LV FUNCTION  . Carotids  02/18/11    CAROTID DUPLEX; VERTEBRALS ARE PATENT WITH ANTEGRADE FLOW. ICA/CCA RATIO 1.61 ON RIGHT AND 0.75 ON LEFT  . Right and left cardiac catheterization  04/23/2013    Angiographic normal coronaries; LVEDP 20 mmHg, PCWP 12-14 mmHg, RAP 12 mmHg.; Fick CO/CI 4.9/2.2    Family History  Problem Relation Age of Onset  . Diabetes Father   . Heart attack Father   . Coronary artery disease Father   . Heart failure Father   . COPD Mother   . Emphysema Mother   . Asthma Mother   . Heart failure Mother   . Sarcoidosis Maternal Uncle   . Colon cancer Neg Hx   . Lung cancer Brother   . Cancer Brother   . Diabetes Brother   . Heart attack Maternal Grandfather     History   Social History  . Marital Status: Married    Spouse Name: BILLY ROCCO    Number of Children: 2  . Years of Education: 12   Occupational History  . DISABLED    Social History Main Topics  . Smoking status: Never Smoker   . Smokeless tobacco: Never Used  . Alcohol Use: No  . Drug Use: No  . Sexual Activity: Yes   Other Topics Concern  . None   Social History Narrative  . None  Current outpatient prescriptions:albuterol (PROVENTIL HFA;VENTOLIN HFA) 108 (90 BASE) MCG/ACT inhaler, Inhale 2 puffs into the lungs every 6 (six) hours as needed. For wheezing., Disp: , Rfl: ;  albuterol (PROVENTIL) (5 MG/ML) 0.5% nebulizer solution, Take 2.5 mg by nebulization every 6 (six) hours as needed. For wheezing., Disp: , Rfl: ;  allopurinol (ZYLOPRIM) 100 MG tablet, Take 100 mg by mouth at bedtime. , Disp: , Rfl:  aspirin EC 81 MG tablet, Take 81 mg by mouth at bedtime. , Disp: , Rfl: ;  atorvastatin (LIPITOR) 40 MG tablet, Take 40 mg by mouth at bedtime., Disp: , Rfl: ;  azithromycin (ZITHROMAX) 250 MG tablet, once. , Disp: , Rfl: ;  B-D ULTRAFINE III SHORT PEN 31G X 8 MM MISC, Follow directions provided by physician, Disp: 30 each,  Rfl: 2 benzonatate (TESSALON) 200 MG capsule, Take 1 capsule (200 mg total) by mouth 3 (three) times daily., Disp: 20 capsule, Rfl: 0;  buPROPion (WELLBUTRIN XL) 300 MG 24 hr tablet, Take 300 mg by mouth daily., Disp: , Rfl: ;  carvedilol (COREG) 25 MG tablet, Take 1 tablet (25 mg total) by mouth 2 (two) times daily with a meal., Disp: 180 tablet, Rfl: 3 chlorpheniramine-HYDROcodone (TUSSIONEX) 10-8 MG/5ML LQCR, Take 5 mLs by mouth every 12 (twelve) hours as needed. For cough., Disp: 240 mL, Rfl: 0;  cholecalciferol (VITAMIN D) 1000 UNITS tablet, Take 1,000 Units by mouth 2 (two) times daily., Disp: , Rfl: ;  ciclesonide (OMNARIS) 50 MCG/ACT nasal spray, Place 1 spray into both nostrils daily. , Disp: , Rfl: ;  cloNIDine (CATAPRES) 0.1 MG tablet, Take 0.1 mg by mouth daily. , Disp: , Rfl:  DEXILANT 60 MG capsule, Take one capsule by mouth one time daily, Disp: 30 capsule, Rfl: 5;  diltiazem (CARDIZEM CD) 120 MG 24 hr capsule, Take 1 capsule (120 mg total) by mouth every evening., Disp: 30 capsule, Rfl: 11;  estradiol (ESTRACE) 2 MG tablet, Take 2 mg by mouth at bedtime. , Disp: , Rfl: ;  fenofibrate (TRICOR) 145 MG tablet, Take one tablet by mouth one time daily, Disp: 30 tablet, Rfl: 10 furosemide (LASIX) 40 MG tablet, Take 1 tablet (40 mg total) by mouth 2 (two) times daily. Take additional 40 mg in AM of 7/15 & 7/16 - to shoot for dry wgt of ~2 lb below current weight., Disp: 45 tablet, Rfl: 12;  glucose blood (ONE TOUCH ULTRA TEST) test strip, 1 each by Other route 3 (three) times daily. Use as instructed, Disp: 100 each, Rfl: 5 glyBURIDE (DIABETA) 2.5 MG tablet, TAKE 1 TABLET BY MOUTH DAILY WITH BREAKFAST., Disp: 30 tablet, Rfl: 2;  insulin glargine (LANTUS) 100 UNIT/ML injection, Inject 23 Units into the skin at bedtime., Disp: , Rfl: ;  LORazepam (ATIVAN) 1 MG tablet, Take 1 mg by mouth 2 (two) times daily as needed for anxiety., Disp: , Rfl: ;  losartan (COZAAR) 100 MG tablet, Take 100 mg by mouth at  bedtime., Disp: , Rfl:  metFORMIN (GLUCOPHAGE) 500 MG tablet, Take 500 mg by mouth 2 (two) times daily with a meal., Disp: , Rfl: ;  mometasone-formoterol (DULERA) 200-5 MCG/ACT AERO, Inhale 2 puffs into the lungs 2 (two) times daily., Disp: 1 Inhaler, Rfl: 6;  nitroGLYCERIN (NITROSTAT) 0.4 MG SL tablet, Place 0.4 mg under the tongue every 5 (five) minutes as needed. x3 doses as needed for chest pain., Disp: , Rfl:  omeprazole (PRILOSEC) 20 MG capsule, Take 1 capsule (20 mg total) by mouth 2 (two) times  daily before a meal., Disp: 90 capsule, Rfl: 1;  potassium chloride SA (K-DUR,KLOR-CON) 20 MEQ tablet, Take one tablet by mouth three times daily, Disp: 90 tablet, Rfl: 4;  predniSONE (DELTASONE) 5 MG tablet, Take 10 mg by mouth every morning. , Disp: , Rfl:  venlafaxine XR (EFFEXOR-XR) 75 MG 24 hr capsule, Take one capsule by mouth twice daily, Disp: 60 capsule, Rfl: 4;  doxycycline (VIBRA-TABS) 100 MG tablet, Take 1 tablet (100 mg total) by mouth 2 (two) times daily., Disp: 20 tablet, Rfl: 0  EXAM:  Filed Vitals:   11/14/13 1047  BP: 120/68  Pulse: 91  Temp: 97.8 F (36.6 C)    Body mass index is 41.34 kg/(m^2).  GENERAL: vitals reviewed and listed above, alert, oriented, appears well hydrated and in no acute distress  HEENT: atraumatic, conjunttiva clear, no obvious abnormalities on inspection of external nose and ears, normal appearance of ear canals and TMs, thick nasal congestion, mild post oropharyngeal erythema with PND, no tonsillar edema or exudate, no sinus TTP  NECK: no obvious masses on inspection  LUNGS: clear to auscultation bilaterally, no wheezes, rales or rhonchi, good air movement  CV: HRRR, no peripheral edema  MS: moves all extremities without noticeable abnormality  PSYCH: pleasant and cooperative, no obvious depression or anxiety  ASSESSMENT AND PLAN:  Discussed the following assessment and plan:  Sinusitis - Plan: doxycycline (VIBRA-TABS) 100 MG tablet  -We  discussed potential etiologies, with VURI or sinusitis being most likely -Given other medical problems and chronic steroids with worsening sinus pain opted to tx with abx in case developing sinusitis -We discussed treatment side effects, likely course, transmission, and signs of developing a serious illness. -of course, we advised to return or notify a doctor immediately if symptoms worsen or persist or new concerns arise.    Patient Instructions  -As we discussed, we have prescribed a new medication for you at this appointment. We discussed the common and serious potential adverse effects of this medication and you can review these and more with the pharmacist when you pick up your medication.  Please follow the instructions for use carefully and notify us immediately if you have any problems taking this medication.  -follow up as needed     KIM, Jarrett Soho R.

## 2013-11-14 NOTE — Progress Notes (Signed)
Pre visit review using our clinic review tool, if applicable. No additional management support is needed unless otherwise documented below in the visit note. 

## 2013-11-14 NOTE — Patient Instructions (Signed)
-  As we discussed, we have prescribed a new medication for you at this appointment. We discussed the common and serious potential adverse effects of this medication and you can review these and more with the pharmacist when you pick up your medication.  Please follow the instructions for use carefully and notify us immediately if you have any problems taking this medication.  -follow up as needed 

## 2013-11-15 ENCOUNTER — Encounter (HOSPITAL_COMMUNITY): Payer: BC Managed Care – PPO

## 2013-11-15 ENCOUNTER — Telehealth (HOSPITAL_COMMUNITY): Payer: Self-pay | Admitting: Internal Medicine

## 2013-11-20 ENCOUNTER — Encounter (HOSPITAL_COMMUNITY)
Admission: RE | Admit: 2013-11-20 | Discharge: 2013-11-20 | Disposition: A | Discharge status: Home / Self Care | Payer: BC Managed Care – PPO | Source: Ambulatory Visit | Attending: Critical Care Medicine | Admitting: Critical Care Medicine

## 2013-11-22 ENCOUNTER — Encounter (HOSPITAL_COMMUNITY): Payer: BC Managed Care – PPO

## 2013-11-22 DIAGNOSIS — S92309A Fracture of unspecified metatarsal bone(s), unspecified foot, initial encounter for closed fracture: Secondary | ICD-10-CM | POA: Diagnosis not present

## 2013-11-22 DIAGNOSIS — IMO0002 Reserved for concepts with insufficient information to code with codable children: Secondary | ICD-10-CM | POA: Diagnosis not present

## 2013-11-22 DIAGNOSIS — M1710 Unilateral primary osteoarthritis, unspecified knee: Secondary | ICD-10-CM | POA: Diagnosis not present

## 2013-11-27 ENCOUNTER — Encounter (HOSPITAL_COMMUNITY): Payer: BC Managed Care – PPO

## 2013-11-29 ENCOUNTER — Encounter (HOSPITAL_COMMUNITY): Payer: Self-pay | Admitting: Emergency Medicine

## 2013-11-29 ENCOUNTER — Ambulatory Visit (INDEPENDENT_AMBULATORY_CARE_PROVIDER_SITE_OTHER): Payer: BC Managed Care – PPO | Admitting: Internal Medicine

## 2013-11-29 ENCOUNTER — Encounter (HOSPITAL_COMMUNITY): Payer: BC Managed Care – PPO

## 2013-11-29 ENCOUNTER — Emergency Department (HOSPITAL_COMMUNITY)
Admission: EM | Admit: 2013-11-29 | Discharge: 2013-11-29 | Disposition: A | Discharge status: Home / Self Care | Payer: BC Managed Care – PPO | Attending: Emergency Medicine | Admitting: Emergency Medicine

## 2013-11-29 ENCOUNTER — Emergency Department (HOSPITAL_COMMUNITY): Payer: BC Managed Care – PPO

## 2013-11-29 ENCOUNTER — Encounter: Payer: Self-pay | Admitting: Internal Medicine

## 2013-11-29 VITALS — BP 168/88 | HR 105 | Temp 98.0°F | Resp 28 | Ht 66.0 in | Wt 262.0 lb

## 2013-11-29 DIAGNOSIS — Z794 Long term (current) use of insulin: Secondary | ICD-10-CM | POA: Insufficient documentation

## 2013-11-29 DIAGNOSIS — I10 Essential (primary) hypertension: Secondary | ICD-10-CM | POA: Diagnosis not present

## 2013-11-29 DIAGNOSIS — R0789 Other chest pain: Secondary | ICD-10-CM | POA: Diagnosis not present

## 2013-11-29 DIAGNOSIS — Z862 Personal history of diseases of the blood and blood-forming organs and certain disorders involving the immune mechanism: Secondary | ICD-10-CM | POA: Diagnosis not present

## 2013-11-29 DIAGNOSIS — M109 Gout, unspecified: Secondary | ICD-10-CM | POA: Diagnosis not present

## 2013-11-29 DIAGNOSIS — E785 Hyperlipidemia, unspecified: Secondary | ICD-10-CM | POA: Diagnosis not present

## 2013-11-29 DIAGNOSIS — R059 Cough, unspecified: Secondary | ICD-10-CM

## 2013-11-29 DIAGNOSIS — R062 Wheezing: Secondary | ICD-10-CM | POA: Diagnosis not present

## 2013-11-29 DIAGNOSIS — D860 Sarcoidosis of lung: Secondary | ICD-10-CM

## 2013-11-29 DIAGNOSIS — J99 Respiratory disorders in diseases classified elsewhere: Secondary | ICD-10-CM

## 2013-11-29 DIAGNOSIS — Z7982 Long term (current) use of aspirin: Secondary | ICD-10-CM | POA: Insufficient documentation

## 2013-11-29 DIAGNOSIS — Z792 Long term (current) use of antibiotics: Secondary | ICD-10-CM | POA: Diagnosis not present

## 2013-11-29 DIAGNOSIS — K219 Gastro-esophageal reflux disease without esophagitis: Secondary | ICD-10-CM

## 2013-11-29 DIAGNOSIS — E1149 Type 2 diabetes mellitus with other diabetic neurological complication: Secondary | ICD-10-CM

## 2013-11-29 DIAGNOSIS — R05 Cough: Secondary | ICD-10-CM | POA: Insufficient documentation

## 2013-11-29 DIAGNOSIS — Z8709 Personal history of other diseases of the respiratory system: Secondary | ICD-10-CM | POA: Insufficient documentation

## 2013-11-29 DIAGNOSIS — Z8619 Personal history of other infectious and parasitic diseases: Secondary | ICD-10-CM | POA: Insufficient documentation

## 2013-11-29 DIAGNOSIS — I5032 Chronic diastolic (congestive) heart failure: Secondary | ICD-10-CM | POA: Diagnosis not present

## 2013-11-29 DIAGNOSIS — E119 Type 2 diabetes mellitus without complications: Secondary | ICD-10-CM | POA: Diagnosis not present

## 2013-11-29 DIAGNOSIS — IMO0002 Reserved for concepts with insufficient information to code with codable children: Secondary | ICD-10-CM | POA: Insufficient documentation

## 2013-11-29 DIAGNOSIS — Z79899 Other long term (current) drug therapy: Secondary | ICD-10-CM | POA: Diagnosis not present

## 2013-11-29 DIAGNOSIS — E1165 Type 2 diabetes mellitus with hyperglycemia: Secondary | ICD-10-CM

## 2013-11-29 DIAGNOSIS — D869 Sarcoidosis, unspecified: Secondary | ICD-10-CM

## 2013-11-29 DIAGNOSIS — E669 Obesity, unspecified: Secondary | ICD-10-CM | POA: Diagnosis not present

## 2013-11-29 DIAGNOSIS — R053 Chronic cough: Secondary | ICD-10-CM

## 2013-11-29 LAB — CBC WITH DIFFERENTIAL/PLATELET
Basophils Absolute: 0.1 10*3/uL (ref 0.0–0.1)
Basophils Relative: 0 % (ref 0–1)
Eosinophils Absolute: 0.3 10*3/uL (ref 0.0–0.7)
Eosinophils Relative: 2 % (ref 0–5)
HCT: 34.7 % — ABNORMAL LOW (ref 36.0–46.0)
Hemoglobin: 11.5 g/dL — ABNORMAL LOW (ref 12.0–15.0)
Lymphocytes Relative: 27 % (ref 12–46)
Lymphs Abs: 3.7 10*3/uL (ref 0.7–4.0)
MCH: 29.6 pg (ref 26.0–34.0)
MCHC: 33.1 g/dL (ref 30.0–36.0)
MCV: 89.2 fL (ref 78.0–100.0)
Monocytes Absolute: 0.9 10*3/uL (ref 0.1–1.0)
Monocytes Relative: 6 % (ref 3–12)
Neutro Abs: 9 10*3/uL — ABNORMAL HIGH (ref 1.7–7.7)
Neutrophils Relative %: 64 % (ref 43–77)
Platelets: 379 10*3/uL (ref 150–400)
RBC: 3.89 MIL/uL (ref 3.87–5.11)
RDW: 13.9 % (ref 11.5–15.5)
WBC: 14 10*3/uL — ABNORMAL HIGH (ref 4.0–10.5)

## 2013-11-29 LAB — POCT I-STAT, CHEM 8
BUN: 18 mg/dL (ref 6–23)
Calcium, Ion: 1.19 mmol/L (ref 1.12–1.23)
Chloride: 95 mEq/L — ABNORMAL LOW (ref 96–112)
Creatinine, Ser: 0.8 mg/dL (ref 0.50–1.10)
Glucose, Bld: 291 mg/dL — ABNORMAL HIGH (ref 70–99)
HCT: 36 % (ref 36.0–46.0)
Hemoglobin: 12.2 g/dL (ref 12.0–15.0)
Potassium: 4 mEq/L (ref 3.7–5.3)
Sodium: 137 mEq/L (ref 137–147)
TCO2: 28 mmol/L (ref 0–100)

## 2013-11-29 LAB — POCT I-STAT TROPONIN I: Troponin i, poc: 0 ng/mL (ref 0.00–0.08)

## 2013-11-29 LAB — D-DIMER, QUANTITATIVE: D-Dimer, Quant: <0.27 ug/mL-FEU (ref 0.00–0.48)

## 2013-11-29 MED ORDER — SUCRALFATE 1 GM/10ML PO SUSP: 1.0000 g | Freq: Three times a day (TID) | ORAL | Status: DC

## 2013-11-29 MED ORDER — LORAZEPAM 2 MG/ML IJ SOLN
1.0000 mg | Freq: Once | INTRAMUSCULAR | Status: AC
  Administered 2013-11-29: 1 mg via INTRAVENOUS
  Filled 2013-11-29: qty 1

## 2013-11-29 MED ORDER — ALBUTEROL SULFATE (2.5 MG/3ML) 0.083% IN NEBU
5.0000 mg | INHALATION_SOLUTION | Freq: Once | RESPIRATORY_TRACT | Status: AC
  Administered 2013-11-29: 5 mg via RESPIRATORY_TRACT
  Filled 2013-11-29: qty 6

## 2013-11-29 MED ORDER — LORAZEPAM 0.5 MG PO TABS: 0.5000 mg | ORAL_TABLET | Freq: Two times a day (BID) | ORAL | Status: DC | PRN

## 2013-11-29 MED ORDER — GI COCKTAIL ~~LOC~~
30.0000 mL | Freq: Once | ORAL | Status: AC
  Administered 2013-11-29: 30 mL via ORAL
  Filled 2013-11-29: qty 30

## 2013-11-29 MED ORDER — ALBUTEROL SULFATE (2.5 MG/3ML) 0.083% IN NEBU
2.5000 mg | INHALATION_SOLUTION | Freq: Once | RESPIRATORY_TRACT | Status: AC
  Administered 2013-11-29: 2.5 mg via RESPIRATORY_TRACT
  Filled 2013-11-29: qty 3

## 2013-11-29 MED ORDER — METHYLPREDNISOLONE SODIUM SUCC 125 MG IJ SOLR
125.0000 mg | Freq: Once | INTRAMUSCULAR | Status: AC
  Administered 2013-11-29: 125 mg via INTRAVENOUS
  Filled 2013-11-29: qty 2

## 2013-11-29 MED ORDER — IPRATROPIUM BROMIDE 0.02 % IN SOLN
0.5000 mg | Freq: Once | RESPIRATORY_TRACT | Status: AC
  Administered 2013-11-29: 0.5 mg via RESPIRATORY_TRACT
  Filled 2013-11-29: qty 2.5

## 2013-11-29 MED ORDER — ALBUTEROL SULFATE (2.5 MG/3ML) 0.083% IN NEBU
INHALATION_SOLUTION | RESPIRATORY_TRACT | Status: AC
  Filled 2013-11-29: qty 3

## 2013-11-29 MED ORDER — HYDROCODONE-HOMATROPINE 5-1.5 MG/5ML PO SYRP: 5.0000 mL | ORAL_SOLUTION | Freq: Four times a day (QID) | ORAL | Status: DC | PRN

## 2013-11-29 NOTE — Progress Notes (Signed)
Pre-visit discussion using our clinic review tool. No additional management support is needed unless otherwise documented below in the visit note.  

## 2013-11-29 NOTE — ED Notes (Addendum)
Pt resting quietly at the time. Dry cough noted. Vital signs stable. Lung sounds clear bilaterally. Denies pain.

## 2013-11-29 NOTE — ED Provider Notes (Signed)
CSN: [id number]     Arrival date & time 11/29/13  0031 History   First MD Initiated Contact with Patient 11/29/13 0126     Chief Complaint  Patient presents with  . Shortness of Breath  . Chest Tightness      (Consider location/radiation/quality/duration/timing/severity/associated sxs/prior Treatment) Patient is a 53 y.o. female presenting with shortness of breath. The history is provided by the patient.  Shortness of Breath Severity:  Moderate Onset quality:  Gradual Timing:  Constant Progression:  Unchanged Chronicity:  Recurrent Context: not URI   Relieved by:  Nothing Worsened by:  Nothing tried Ineffective treatments:  None tried Associated symptoms: cough and wheezing   Associated symptoms: no chest pain and no fever   Cough:    Cough characteristics:  Non-productive   Severity:  Moderate   Onset quality:  Gradual   Timing:  Intermittent   Progression:  Unchanged   Chronicity:  Recurrent Risk factors: no recent alcohol use and no hx of PE/DVT   No long car trips or plane trips  Past Medical History  Diagnosis Date  . DIABETES MELLITUS, TYPE II 08/21/2006  . HYPERLIPIDEMIA 08/21/2006  . GOUT 08/20/2010  . OBESITY 06/04/2009  . ANEMIA-UNSPECIFIED 09/18/2009  . HYPERTENSION 08/21/2006  . GERD 08/21/2006  . SLEEP APNEA 04/21/2009    last testing was negative  . Internal hemorrhoids   . Pulmonary sarcoidosis     Followed locally by pulmonology, but also by Dr. Casper Harrison at [facility]  . Exertional chest pain     sharp, substernal, exertional  . Vocal cord dysfunction   . Chronic diastolic heart failure, NYHA class 2     LVEDP roughly 20% by cath  . CHF (congestive heart failure)    Past Surgical History  Procedure Laterality Date  . Ventral hernia repair    . Nissen fundoplication  123XX123  . Cholecystectomy  1984  . Abdominal hysterectomy    . Knee arthroscopy      right  . Tubal ligation      with reversal in 1994  . Bladder suspension   11/11/2011    Procedure: TRANSVAGINAL TAPE (TVT) PROCEDURE;  Surgeon: Olga Millers, MD;  Location: [facility];  Service: Gynecology;  Laterality: N/A;  . Cystoscopy  11/11/2011    Procedure: CYSTOSCOPY;  Surgeon: Olga Millers, MD;  Location: [facility];  Service: Gynecology;  Laterality: N/A;  . Doppler echocardiography  02/12/2013    LV FUNCTION, SIZE NORMAL; MILD CONCENTRIC LVH; EST EF 55-65%; WALL MOTION NORMAL  . Lexiscan myoview  03/09/2013    EF 50%; NORMAL MYOCARDIAL PERFUSION STUDY - breast attenuation  . Cardiac catheterization  07/2010    LVEF 50-55% WITH VERY MILD GLOBAL HYPOKINESIA; ESSENTIALLY NORMAL CORONARY ARTERIES; NORMAL LV FUNCTION  . Carotids  02/18/11    CAROTID DUPLEX; VERTEBRALS ARE PATENT WITH ANTEGRADE FLOW. ICA/CCA RATIO 1.61 ON RIGHT AND 0.75 ON LEFT  . Right and left cardiac catheterization  04/23/2013    Angiographic normal coronaries; LVEDP 20 mmHg, PCWP 12-14 mmHg, RAP 12 mmHg.; Fick CO/CI 4.9/2.2   Family History  Problem Relation Age of Onset  . Diabetes Father   . Heart attack Father   . Coronary artery disease Father   . Heart failure Father   . COPD Mother   . Emphysema Mother   . Asthma Mother   . Heart failure Mother   . Sarcoidosis Maternal Uncle   . Colon cancer Neg Hx   . Lung cancer Brother   .  Cancer Brother   . Diabetes Brother   . Heart attack Maternal Grandfather    History  Substance Use Topics  . Smoking status: Never Smoker   . Smokeless tobacco: Never Used  . Alcohol Use: No   OB History   Grav Para Term Preterm Abortions TAB SAB Ect Mult Living                 Review of Systems  Constitutional: Negative for fever.  Respiratory: Positive for cough, shortness of breath and wheezing. Negative for chest tightness.   Cardiovascular: Negative for chest pain, palpitations and leg swelling.  All other systems reviewed and are negative.      Allergies  Lisinopril; Methotrexate; and Clindamycin/lincomycin  Home Medications    Current Outpatient Rx  Name  Route  Sig  Dispense  Refill  . albuterol (PROVENTIL HFA;VENTOLIN HFA) 108 (90 BASE) MCG/ACT inhaler   Inhalation   Inhale 2 puffs into the lungs every 6 (six) hours as needed. For wheezing.         [name] albuterol (PROVENTIL) (5 MG/ML) 0.5% nebulizer solution   Nebulization   Take 2.5 mg by nebulization every 6 (six) hours as needed. For wheezing.         [name] allopurinol (ZYLOPRIM) 100 MG tablet   Oral   Take 100 mg by mouth at bedtime.          [name] aspirin EC 81 MG tablet   Oral   Take 81 mg by mouth at bedtime.          [name] atorvastatin (LIPITOR) 40 MG tablet   Oral   Take 40 mg by mouth at bedtime.         [name] azithromycin (ZITHROMAX) 250 MG tablet      once.          . B-D ULTRAFINE III SHORT PEN 31G X 8 MM MISC      Follow directions provided by physician   30 each   2   . benzonatate (TESSALON) 200 MG capsule   Oral   Take 1 capsule (200 mg total) by mouth 3 (three) times daily.   20 capsule   0   . buPROPion (WELLBUTRIN XL) 300 MG 24 hr tablet   Oral   Take 300 mg by mouth daily.         . carvedilol (COREG) 25 MG tablet   Oral   Take 1 tablet (25 mg total) by mouth 2 (two) times daily with a meal.   180 tablet   3   . chlorpheniramine-HYDROcodone (TUSSIONEX) 10-8 MG/5ML LQCR   Oral   Take 5 mLs by mouth every 12 (twelve) hours as needed. For cough.   240 mL   0   . cholecalciferol (VITAMIN D) 1000 UNITS tablet   Oral   Take 1,000 Units by mouth 2 (two) times daily.         . ciclesonide (OMNARIS) 50 MCG/ACT nasal spray   Each Nare   Place 1 spray into both nostrils daily.          . cloNIDine (CATAPRES) 0.1 MG tablet   Oral   Take 0.1 mg by mouth daily.          [name] DEXILANT 60 MG capsule      Take one capsule by mouth one time daily   30 capsule   5   . diltiazem (CARDIZEM CD) 120 MG 24 hr capsule   Oral   Take  1 capsule (120 mg total) by mouth every evening.   30 capsule   11   .  doxycycline (VIBRA-TABS) 100 MG tablet   Oral   Take 1 tablet (100 mg total) by mouth 2 (two) times daily.   20 tablet   0   . estradiol (ESTRACE) 2 MG tablet   Oral   Take 2 mg by mouth at bedtime.          . fenofibrate (TRICOR) 145 MG tablet      Take one tablet by mouth one time daily   30 tablet   10   . furosemide (LASIX) 40 MG tablet   Oral   Take 1 tablet (40 mg total) by mouth 2 (two) times daily. Take additional 40 mg in AM of 7/15 & 7/16 - to shoot for dry wgt of ~2 lb below current weight.   45 tablet   12   . glucose blood (ONE TOUCH ULTRA TEST) test strip   Other   1 each by Other route 3 (three) times daily. Use as instructed   100 each   5   . glyBURIDE (DIABETA) 2.5 MG tablet      TAKE 1 TABLET BY MOUTH DAILY WITH BREAKFAST.   30 tablet   2   . insulin glargine (LANTUS) 100 UNIT/ML injection   Subcutaneous   Inject 23 Units into the skin at bedtime.         [name] LORazepam (ATIVAN) 1 MG tablet   Oral   Take 1 mg by mouth 2 (two) times daily as needed for anxiety.         [name] losartan (COZAAR) 100 MG tablet   Oral   Take 100 mg by mouth at bedtime.         . metFORMIN (GLUCOPHAGE) 500 MG tablet   Oral   Take 500 mg by mouth 2 (two) times daily with a meal.         . mometasone-formoterol (DULERA) 200-5 MCG/ACT AERO   Inhalation   Inhale 2 puffs into the lungs 2 (two) times daily.   1 Inhaler   6   . nitroGLYCERIN (NITROSTAT) 0.4 MG SL tablet   Sublingual   Place 0.4 mg under the tongue every 5 (five) minutes as needed. x3 doses as needed for chest pain.         [name] omeprazole (PRILOSEC) 20 MG capsule   Oral   Take 1 capsule (20 mg total) by mouth 2 (two) times daily before a meal.   90 capsule   1   . potassium chloride SA (K-DUR,KLOR-CON) 20 MEQ tablet      Take one tablet by mouth three times daily   90 tablet   4   . predniSONE (DELTASONE) 5 MG tablet   Oral   Take 10 mg by mouth every morning.          . venlafaxine  XR (EFFEXOR-XR) 75 MG 24 hr capsule      Take one capsule by mouth twice daily   60 capsule   4    BP 152/86  Pulse 88  Temp(Src) 97.6 F (36.4 C) (Oral)  Resp 28  Ht 5\' 6"  (1.676 m)  Wt 261 lb (118.389 kg)  BMI 42.15 kg/m2  SpO2 100% Physical Exam  Constitutional: She is oriented to person, place, and time. She appears well-developed and well-nourished. No distress.  HENT:  Head: Normocephalic and atraumatic.  Mouth/Throat: Oropharynx is clear and moist.  Eyes: Conjunctivae are normal. Pupils are equal, round, and reactive to light.  Neck: Normal range of motion. Neck supple.  Cardiovascular: Normal rate, regular rhythm and intact distal pulses.   Pulmonary/Chest: Effort normal. No respiratory distress. She has wheezes. She has no rales. She exhibits no tenderness.  Abdominal: Soft. Bowel sounds are normal. There is no tenderness. There is no rebound and no guarding.  Musculoskeletal: Normal range of motion.  Neurological: She is alert and oriented to person, place, and time.  Skin: Skin is warm and dry.  Psychiatric: She has a normal mood and affect.    ED Course  Procedures (including critical care time) Labs Review Labs Reviewed  CBC WITH DIFFERENTIAL  D-DIMER, QUANTITATIVE   Imaging Review Dg Chest Portable 1 View  11/29/2013   CLINICAL DATA:  Shortness of breath, cough.  EXAM: PORTABLE CHEST - 1 VIEW  COMPARISON:  10/02/2013  FINDINGS: Cardiomegaly. Lungs are clear. No effusions or edema. No acute bony abnormality.  IMPRESSION: Cardiomegaly.  No active disease.   Electronically Signed   By: Rolm Baptise M.D.   On: 11/29/2013 01:25    EKG Interpretation    Date/Time:  Thursday November 29 2013 00:57:53 EST Ventricular Rate:  90 PR Interval:  152 QRS Duration: 76 QT Interval:  364 QTC Calculation: 445 R Axis:   56 Text Interpretation:  Normal sinus rhythm Low voltage QRS Confirmed by [facility]  MD, Junice Fei ([id number]) on 11/29/2013 1:34:32 AM             MDM  Rock Nephew 0 Final diagnoses:  None    Seen by the hospitalist Dr. Alcario Drought, Will treat with ativan and carafate.  Follow up with GI fo ongoing care    Rehmat Murtagh K Ineta Sinning-Rasch, MD 11/29/13 [phone]

## 2013-11-29 NOTE — Patient Instructions (Signed)
Acute bronchitis symptoms for less than 10 days are generally not helped by antibiotics.  Take over-the-counter expectorants and cough medications such as  Mucinex DM.  Call if there is no improvement in 5 to 7 days or if he developed worsening cough, fever, or new symptoms, such as shortness of breath or chest pain.  Call or return to clinic prn if these symptoms worsen or fail to improve as anticipated.

## 2013-11-29 NOTE — Discharge Instructions (Signed)
Diet for Gastroesophageal Reflux Disease, Adult °Reflux (acid reflux) is when acid from your stomach flows up into the esophagus. When acid comes in contact with the esophagus, the acid causes irritation and soreness (inflammation) in the esophagus. When reflux happens often or so severely that it causes damage to the esophagus, it is called gastroesophageal reflux disease (GERD). Nutrition therapy can help ease the discomfort of GERD. °FOODS OR DRINKS TO AVOID OR LIMIT °· Smoking or chewing tobacco. Nicotine is one of the most potent stimulants to acid production in the gastrointestinal tract. °· Caffeinated and decaffeinated coffee and black tea. °· Regular or low-calorie carbonated beverages or energy drinks (caffeine-free carbonated beverages are allowed).   °· Strong spices, such as black pepper, white pepper, red pepper, cayenne, curry powder, and chili powder. °· Peppermint or spearmint. °· Chocolate. °· High-fat foods, including meats and fried foods. Extra added fats including oils, butter, salad dressings, and nuts. Limit these to less than 8 tsp per day. °· Fruits and vegetables if they are not tolerated, such as citrus fruits or tomatoes. °· Alcohol. °· Any food that seems to aggravate your condition. °If you have questions regarding your diet, call your caregiver or a registered dietitian. °OTHER THINGS THAT MAY HELP GERD INCLUDE:  °· Eating your meals slowly, in a relaxed setting. °· Eating 5 to 6 small meals per day instead of 3 large meals. °· Eliminating food for a period of time if it causes distress. °· Not lying down until 3 hours after eating a meal. °· Keeping the head of your bed raised 6 to 9 inches (15 to 23 cm) by using a foam wedge or blocks under the legs of the bed. Lying flat may make symptoms worse. °· Being physically active. Weight loss may be helpful in reducing reflux in overweight or obese adults. °· Wear loose fitting clothing °EXAMPLE MEAL PLAN °This meal plan is approximately  2,000 calories based on [employer]My[employer]gov meal planning guidelines. °Breakfast °· ½ cup cooked oatmeal. °· 1 cup strawberries. °· 1 cup low-fat milk. °· 1 oz almonds. °Snack °· 1 cup cucumber slices. °· 6 oz yogurt (made from low-fat or fat-free milk). °Lunch °· 2 slice whole-wheat bread. °· 2½ oz sliced turkey. °· 2 tsp mayonnaise. °· 1 cup blueberries. °· 1 cup snap peas. °Snack °· 6 whole-wheat crackers. °· 1 oz string cheese. °Dinner °· ½ cup brown rice. °· 1 cup mixed veggies. °· 1 tsp olive oil. °· 3 oz grilled fish. °Document Released: 09/27/2005 Document Revised: 12/20/2011 Document Reviewed: 08/13/2011 °Exit[employer]® Patient Information ©2014 [employer]. ° °

## 2013-11-29 NOTE — ED Notes (Signed)
Pt states she feels much better after receiving ativan. Respirations unlabored. Vital signs stable. Drinking sprite at the time. Husband at bedside. No signs of acute distress noted.

## 2013-11-29 NOTE — ED Notes (Signed)
Patient presents with c/o SOB and chest tightness

## 2013-11-29 NOTE — Progress Notes (Signed)
Subjective:    Patient ID: [name], female    DOB: [birth year], 53 y.o.   MRN: [id number]  HPI  53 year old patient whose chronic medical problems include pulmonary sarcoidosis, chronic cough, and diastolic heart failure.  She presents with a three-day history of nonproductive cough and fatigue.  She was seen in the ED last night and treated with albuterol and lorazepam with improvement.  Her main symptom is chronic, refractory cough.  There has been no wheezing today. She denies any fever.  Past Medical History  Diagnosis Date  . DIABETES MELLITUS, TYPE II 08/21/2006  . HYPERLIPIDEMIA 08/21/2006  . GOUT 08/20/2010  . OBESITY 06/04/2009  . ANEMIA-UNSPECIFIED 09/18/2009  . HYPERTENSION 08/21/2006  . GERD 08/21/2006  . SLEEP APNEA 04/21/2009    last testing was negative  . Internal hemorrhoids   . Pulmonary sarcoidosis     Followed locally by pulmonology, but also by Dr. Casper Harrison at [facility]  . Exertional chest pain     sharp, substernal, exertional  . Vocal cord dysfunction   . Chronic diastolic heart failure, NYHA class 2     LVEDP roughly 20% by cath  . CHF (congestive heart failure)     History   Social History  . Marital Status: Married    Spouse Name: NAKEISHA BUNCH    Number of Children: 2  . Years of Education: 12   Occupational History  . DISABLED    Social History Main Topics  . Smoking status: Never Smoker   . Smokeless tobacco: Never Used  . Alcohol Use: No  . Drug Use: No  . Sexual Activity: Yes   Other Topics Concern  . Not on file   Social History Narrative  . No narrative on file    Past Surgical History  Procedure Laterality Date  . Ventral hernia repair    . Nissen fundoplication  123XX123  . Cholecystectomy  1984  . Abdominal hysterectomy    . Knee arthroscopy      right  . Tubal ligation      with reversal in 1994  . Bladder suspension  11/11/2011    Procedure: TRANSVAGINAL TAPE (TVT) PROCEDURE;  Surgeon: Olga Millers, MD;  Location: [facility];  Service: Gynecology;  Laterality: N/A;  . Cystoscopy  11/11/2011    Procedure: CYSTOSCOPY;  Surgeon: Olga Millers, MD;  Location: [facility];  Service: Gynecology;  Laterality: N/A;  . Doppler echocardiography  02/12/2013    LV FUNCTION, SIZE NORMAL; MILD CONCENTRIC LVH; EST EF 55-65%; WALL MOTION NORMAL  . Lexiscan myoview  03/09/2013    EF 50%; NORMAL MYOCARDIAL PERFUSION STUDY - breast attenuation  . Cardiac catheterization  07/2010    LVEF 50-55% WITH VERY MILD GLOBAL HYPOKINESIA; ESSENTIALLY NORMAL CORONARY ARTERIES; NORMAL LV FUNCTION  . Carotids  02/18/11    CAROTID DUPLEX; VERTEBRALS ARE PATENT WITH ANTEGRADE FLOW. ICA/CCA RATIO 1.61 ON RIGHT AND 0.75 ON LEFT  . Right and left cardiac catheterization  04/23/2013    Angiographic normal coronaries; LVEDP 20 mmHg, PCWP 12-14 mmHg, RAP 12 mmHg.; Fick CO/CI 4.9/2.2    Family History  Problem Relation Age of Onset  . Diabetes Father   . Heart attack Father   . Coronary artery disease Father   . Heart failure Father   . COPD Mother   . Emphysema Mother   . Asthma Mother   . Heart failure Mother   . Sarcoidosis Maternal Uncle   . Colon cancer Neg Hx   .  Lung cancer Brother   . Cancer Brother   . Diabetes Brother   . Heart attack Maternal Grandfather     Allergies  Allergen Reactions  . Lisinopril Cough  . Methotrexate     REACTION: peri-oral and buccal lesions.  . Clindamycin/Lincomycin Nausea And Vomiting and Rash    Current Outpatient Prescriptions on File Prior to Visit  Medication Sig Dispense Refill  . albuterol (PROVENTIL HFA;VENTOLIN HFA) 108 (90 BASE) MCG/ACT inhaler Inhale 2 puffs into the lungs every 6 (six) hours as needed. For wheezing.      [name] albuterol (PROVENTIL) (5 MG/ML) 0.5% nebulizer solution Take 2.5 mg by nebulization every 6 (six) hours as needed. For wheezing.      [name] allopurinol (ZYLOPRIM) 100 MG tablet Take 100 mg by mouth at bedtime.       [name] aspirin EC 81 MG tablet Take  81 mg by mouth at bedtime.       [name] atorvastatin (LIPITOR) 40 MG tablet Take 40 mg by mouth at bedtime.      [name] azithromycin (ZITHROMAX) 250 MG tablet 250 mg once. Barron Schmid, Wed, Friday as maintenance      . B-D ULTRAFINE III SHORT PEN 31G X 8 MM MISC Follow directions provided by physician  30 each  2  . benzonatate (TESSALON) 200 MG capsule Take 1 capsule (200 mg total) by mouth 3 (three) times daily.  20 capsule  0  . buPROPion (WELLBUTRIN XL) 300 MG 24 hr tablet Take 300 mg by mouth daily.      . carvedilol (COREG) 25 MG tablet Take 1 tablet (25 mg total) by mouth 2 (two) times daily with a meal.  180 tablet  3  . chlorpheniramine-HYDROcodone (TUSSIONEX) 10-8 MG/5ML LQCR Take 5 mLs by mouth every 12 (twelve) hours as needed. For cough.  240 mL  0  . cholecalciferol (VITAMIN D) 1000 UNITS tablet Take 1,000 Units by mouth 2 (two) times daily.      . ciclesonide (OMNARIS) 50 MCG/ACT nasal spray Place 1 spray into both nostrils daily.       [name] DEXILANT 60 MG capsule Take one capsule by mouth one time daily  30 capsule  5  . diltiazem (CARDIZEM CD) 120 MG 24 hr capsule Take 1 capsule (120 mg total) by mouth every evening.  30 capsule  11  . estradiol (ESTRACE) 2 MG tablet Take 2 mg by mouth at bedtime.       . fenofibrate (TRICOR) 145 MG tablet Take one tablet by mouth one time daily  30 tablet  10  . furosemide (LASIX) 40 MG tablet Take 1 tablet (40 mg total) by mouth 2 (two) times daily. Take additional 40 mg in AM of 7/15 & 7/16 - to shoot for dry wgt of ~2 lb below current weight.  45 tablet  12  . glucose blood (ONE TOUCH ULTRA TEST) test strip 1 each by Other route 3 (three) times daily. Use as instructed  100 each  5  . glyBURIDE (DIABETA) 2.5 MG tablet TAKE 1 TABLET BY MOUTH DAILY WITH BREAKFAST.  30 tablet  2  . insulin glargine (LANTUS) 100 UNIT/ML injection Inject 23 Units into the skin at bedtime.      [name] LORazepam (ATIVAN) 1 MG tablet Take 1 mg by mouth 2 (two) times daily as needed for  anxiety.      [name] losartan (COZAAR) 100 MG tablet Take 100 mg by mouth at bedtime.      [name]  metFORMIN (GLUCOPHAGE) 500 MG tablet Take 500 mg by mouth 2 (two) times daily with a meal.      . mometasone-formoterol (DULERA) 200-5 MCG/ACT AERO Inhale 2 puffs into the lungs 2 (two) times daily.  1 Inhaler  6  . nitroGLYCERIN (NITROSTAT) 0.4 MG SL tablet Place 0.4 mg under the tongue every 5 (five) minutes as needed. x3 doses as needed for chest pain.      [name] omeprazole (PRILOSEC) 20 MG capsule Take 1 capsule (20 mg total) by mouth 2 (two) times daily before a meal.  90 capsule  1  . predniSONE (DELTASONE) 5 MG tablet Take 10 mg by mouth every morning.        No current facility-administered medications on file prior to visit.    BP 168/88  Pulse 105  Temp(Src) 98 F (36.7 C) (Oral)  Resp 28  Ht 5\' 6"  (1.676 m)  Wt 262 lb (118.842 kg)  BMI 42.31 kg/m2  SpO2 97%       Review of Systems  Constitutional: Negative.   HENT: Negative for congestion, dental problem, hearing loss, rhinorrhea, sinus pressure, sore throat and tinnitus.   Eyes: Negative for pain, discharge and visual disturbance.  Respiratory: Negative for cough, shortness of breath, wheezing and stridor.   Cardiovascular: Negative for chest pain, palpitations and leg swelling.  Gastrointestinal: Negative for nausea, vomiting, abdominal pain, diarrhea, constipation, blood in stool and abdominal distention.  Genitourinary: Negative for dysuria, urgency, frequency, hematuria, flank pain, vaginal bleeding, vaginal discharge, difficulty urinating, vaginal pain and pelvic pain.  Musculoskeletal: Negative for arthralgias, gait problem and joint swelling.  Skin: Negative for rash.  Neurological: Negative for dizziness, syncope, speech difficulty, weakness, numbness and headaches.  Hematological: Negative for adenopathy.  Psychiatric/Behavioral: Negative for behavioral problems, dysphoric mood and agitation. The patient is not  nervous/anxious.        Objective:   Physical Exam  Constitutional: She is oriented to person, place, and time. She appears well-developed and well-nourished. No distress.  HENT:  Head: Normocephalic.  Right Ear: External ear normal.  Left Ear: External ear normal.  Mouth/Throat: Oropharynx is clear and moist.  Eyes: Conjunctivae and EOM are normal. Pupils are equal, round, and reactive to light.  Neck: Normal range of motion. Neck supple. No thyromegaly present.  Cardiovascular: Normal rate, regular rhythm, normal heart sounds and intact distal pulses.   Pulmonary/Chest: Effort normal and breath sounds normal.  O2 saturation 97% Pulse rate 100 Frequent paroxysms of coughing No stridor or wheezing No increased work of breathing  Abdominal: Soft. Bowel sounds are normal. She exhibits no mass. There is no tenderness.  Musculoskeletal: Normal range of motion.  Lymphadenopathy:    She has no cervical adenopathy.  Neurological: She is alert and oriented to person, place, and time.  Skin: Skin is warm and dry. No rash noted.  Psychiatric: She has a normal mood and affect. Her behavior is normal.          Assessment & Plan:   Viral tracheobronchitis.  We'll treat her cough aggressively, and an antitussives dispensed Pulmonary sarcoidosis History diastolic heart failure  Pulmonary followup Pulmonary rehabilitation Return here as needed

## 2013-12-02 ENCOUNTER — Inpatient Hospital Stay (HOSPITAL_COMMUNITY)
Admission: EM | Admit: 2013-12-02 | Discharge: 2013-12-05 | DRG: 189 | Disposition: A | Discharge status: Home / Self Care | Payer: BC Managed Care – PPO | Attending: Internal Medicine | Admitting: Internal Medicine

## 2013-12-02 ENCOUNTER — Emergency Department (HOSPITAL_COMMUNITY): Payer: BC Managed Care – PPO

## 2013-12-02 ENCOUNTER — Encounter (HOSPITAL_COMMUNITY): Payer: Self-pay | Admitting: Emergency Medicine

## 2013-12-02 DIAGNOSIS — E785 Hyperlipidemia, unspecified: Secondary | ICD-10-CM | POA: Diagnosis present

## 2013-12-02 DIAGNOSIS — IMO0002 Reserved for concepts with insufficient information to code with codable children: Secondary | ICD-10-CM

## 2013-12-02 DIAGNOSIS — Z801 Family history of malignant neoplasm of trachea, bronchus and lung: Secondary | ICD-10-CM

## 2013-12-02 DIAGNOSIS — J9601 Acute respiratory failure with hypoxia: Secondary | ICD-10-CM | POA: Diagnosis present

## 2013-12-02 DIAGNOSIS — E1159 Type 2 diabetes mellitus with other circulatory complications: Secondary | ICD-10-CM | POA: Diagnosis present

## 2013-12-02 DIAGNOSIS — E669 Obesity, unspecified: Secondary | ICD-10-CM | POA: Diagnosis present

## 2013-12-02 DIAGNOSIS — I10 Essential (primary) hypertension: Secondary | ICD-10-CM | POA: Diagnosis present

## 2013-12-02 DIAGNOSIS — Z7982 Long term (current) use of aspirin: Secondary | ICD-10-CM

## 2013-12-02 DIAGNOSIS — E1149 Type 2 diabetes mellitus with other diabetic neurological complication: Secondary | ICD-10-CM | POA: Diagnosis present

## 2013-12-02 DIAGNOSIS — R911 Solitary pulmonary nodule: Secondary | ICD-10-CM | POA: Diagnosis present

## 2013-12-02 DIAGNOSIS — I5032 Chronic diastolic (congestive) heart failure: Secondary | ICD-10-CM | POA: Diagnosis present

## 2013-12-02 DIAGNOSIS — R064 Hyperventilation: Secondary | ICD-10-CM | POA: Diagnosis present

## 2013-12-02 DIAGNOSIS — R05 Cough: Secondary | ICD-10-CM

## 2013-12-02 DIAGNOSIS — Z8249 Family history of ischemic heart disease and other diseases of the circulatory system: Secondary | ICD-10-CM

## 2013-12-02 DIAGNOSIS — R059 Cough, unspecified: Secondary | ICD-10-CM

## 2013-12-02 DIAGNOSIS — I509 Heart failure, unspecified: Secondary | ICD-10-CM | POA: Diagnosis present

## 2013-12-02 DIAGNOSIS — D72829 Elevated white blood cell count, unspecified: Secondary | ICD-10-CM | POA: Diagnosis present

## 2013-12-02 DIAGNOSIS — R079 Chest pain, unspecified: Secondary | ICD-10-CM

## 2013-12-02 DIAGNOSIS — I110 Hypertensive heart disease with heart failure: Secondary | ICD-10-CM | POA: Diagnosis present

## 2013-12-02 DIAGNOSIS — I152 Hypertension secondary to endocrine disorders: Secondary | ICD-10-CM | POA: Diagnosis present

## 2013-12-02 DIAGNOSIS — J45901 Unspecified asthma with (acute) exacerbation: Secondary | ICD-10-CM | POA: Diagnosis present

## 2013-12-02 DIAGNOSIS — R071 Chest pain on breathing: Secondary | ICD-10-CM | POA: Diagnosis present

## 2013-12-02 DIAGNOSIS — E1165 Type 2 diabetes mellitus with hyperglycemia: Secondary | ICD-10-CM

## 2013-12-02 DIAGNOSIS — J209 Acute bronchitis, unspecified: Secondary | ICD-10-CM | POA: Diagnosis present

## 2013-12-02 DIAGNOSIS — K219 Gastro-esophageal reflux disease without esophagitis: Secondary | ICD-10-CM | POA: Diagnosis present

## 2013-12-02 DIAGNOSIS — M109 Gout, unspecified: Secondary | ICD-10-CM | POA: Diagnosis present

## 2013-12-02 DIAGNOSIS — J9819 Other pulmonary collapse: Secondary | ICD-10-CM | POA: Diagnosis present

## 2013-12-02 DIAGNOSIS — J99 Respiratory disorders in diseases classified elsewhere: Secondary | ICD-10-CM | POA: Diagnosis present

## 2013-12-02 DIAGNOSIS — D860 Sarcoidosis of lung: Secondary | ICD-10-CM

## 2013-12-02 DIAGNOSIS — J4489 Other specified chronic obstructive pulmonary disease: Secondary | ICD-10-CM | POA: Diagnosis present

## 2013-12-02 DIAGNOSIS — J383 Other diseases of vocal cords: Secondary | ICD-10-CM | POA: Diagnosis present

## 2013-12-02 DIAGNOSIS — Z833 Family history of diabetes mellitus: Secondary | ICD-10-CM

## 2013-12-02 DIAGNOSIS — E1129 Type 2 diabetes mellitus with other diabetic kidney complication: Secondary | ICD-10-CM | POA: Diagnosis present

## 2013-12-02 DIAGNOSIS — J449 Chronic obstructive pulmonary disease, unspecified: Secondary | ICD-10-CM | POA: Diagnosis present

## 2013-12-02 DIAGNOSIS — J441 Chronic obstructive pulmonary disease with (acute) exacerbation: Secondary | ICD-10-CM | POA: Diagnosis present

## 2013-12-02 DIAGNOSIS — D869 Sarcoidosis, unspecified: Secondary | ICD-10-CM | POA: Diagnosis present

## 2013-12-02 DIAGNOSIS — Z794 Long term (current) use of insulin: Secondary | ICD-10-CM

## 2013-12-02 DIAGNOSIS — J9600 Acute respiratory failure, unspecified whether with hypoxia or hypercapnia: Principal | ICD-10-CM | POA: Diagnosis present

## 2013-12-02 DIAGNOSIS — J440 Chronic obstructive pulmonary disease with acute lower respiratory infection: Secondary | ICD-10-CM | POA: Diagnosis present

## 2013-12-02 DIAGNOSIS — R053 Chronic cough: Secondary | ICD-10-CM | POA: Diagnosis present

## 2013-12-02 DIAGNOSIS — G4730 Sleep apnea, unspecified: Secondary | ICD-10-CM | POA: Diagnosis present

## 2013-12-02 LAB — CBC WITH DIFFERENTIAL/PLATELET
Basophils Absolute: 0.1 10*3/uL (ref 0.0–0.1)
Basophils Relative: 1 % (ref 0–1)
Eosinophils Absolute: 0.2 10*3/uL (ref 0.0–0.7)
Eosinophils Relative: 1 % (ref 0–5)
HCT: 34.4 % — ABNORMAL LOW (ref 36.0–46.0)
Hemoglobin: 11.5 g/dL — ABNORMAL LOW (ref 12.0–15.0)
Lymphocytes Relative: 22 % (ref 12–46)
Lymphs Abs: 3.4 10*3/uL (ref 0.7–4.0)
MCH: 29.6 pg (ref 26.0–34.0)
MCHC: 33.4 g/dL (ref 30.0–36.0)
MCV: 88.4 fL (ref 78.0–100.0)
Monocytes Absolute: 0.9 10*3/uL (ref 0.1–1.0)
Monocytes Relative: 6 % (ref 3–12)
Neutro Abs: 10.5 10*3/uL — ABNORMAL HIGH (ref 1.7–7.7)
Neutrophils Relative %: 70 % (ref 43–77)
Platelets: 413 10*3/uL — ABNORMAL HIGH (ref 150–400)
RBC: 3.89 MIL/uL (ref 3.87–5.11)
RDW: 13.6 % (ref 11.5–15.5)
WBC: 15 10*3/uL — ABNORMAL HIGH (ref 4.0–10.5)

## 2013-12-02 LAB — BLOOD GAS, ARTERIAL
Acid-Base Excess: 3.4 mmol/L — ABNORMAL HIGH (ref 0.0–2.0)
Bicarbonate: 24 mEq/L (ref 20.0–24.0)
Drawn by: 235321
O2 Content: 2 L/min
O2 Saturation: 98.8 %
Patient temperature: 98.6
TCO2: 21 mmol/L (ref 0–100)
pCO2 arterial: 24.5 mmHg — ABNORMAL LOW (ref 35.0–45.0)
pH, Arterial: 7.597 — ABNORMAL HIGH (ref 7.350–7.450)
pO2, Arterial: 112 mmHg — ABNORMAL HIGH (ref 80.0–100.0)

## 2013-12-02 LAB — CBG MONITORING, ED: Glucose-Capillary: 237 mg/dL — ABNORMAL HIGH (ref 70–99)

## 2013-12-02 LAB — BASIC METABOLIC PANEL
BUN: 20 mg/dL (ref 6–23)
CO2: 25 mEq/L (ref 19–32)
Calcium: 9.7 mg/dL (ref 8.4–10.5)
Chloride: 91 mEq/L — ABNORMAL LOW (ref 96–112)
Creatinine, Ser: 0.7 mg/dL (ref 0.50–1.10)
GFR calc Af Amer: >90 mL/min (ref >90)
GFR calc non Af Amer: >90 mL/min (ref >90)
Glucose, Bld: 281 mg/dL — ABNORMAL HIGH (ref 70–99)
Potassium: 3.8 mEq/L (ref 3.7–5.3)
Sodium: 135 mEq/L — ABNORMAL LOW (ref 137–147)

## 2013-12-02 LAB — I-STAT TROPONIN, ED: Troponin i, poc: 0 ng/mL (ref 0.00–0.08)

## 2013-12-02 LAB — PRO B NATRIURETIC PEPTIDE: Pro B Natriuretic peptide (BNP): 63.5 pg/mL (ref 0–125)

## 2013-12-02 MED ORDER — IPRATROPIUM BROMIDE 0.02 % IN SOLN: 0.5000 mg | Freq: Once | RESPIRATORY_TRACT | Status: DC

## 2013-12-02 MED ORDER — LORAZEPAM 2 MG/ML IJ SOLN
0.5000 mg | Freq: Once | INTRAMUSCULAR | Status: DC
  Filled 2013-12-02: qty 1

## 2013-12-02 MED ORDER — LORAZEPAM 2 MG/ML IJ SOLN
0.5000 mg | Freq: Once | INTRAMUSCULAR | Status: AC
  Administered 2013-12-02: 0.5 mg via INTRAVENOUS
  Filled 2013-12-02: qty 1

## 2013-12-02 MED ORDER — ALBUTEROL SULFATE (2.5 MG/3ML) 0.083% IN NEBU
5.0000 mg | INHALATION_SOLUTION | Freq: Once | RESPIRATORY_TRACT | Status: AC
  Administered 2013-12-02: 5 mg via RESPIRATORY_TRACT
  Filled 2013-12-02: qty 6

## 2013-12-02 MED ORDER — IPRATROPIUM-ALBUTEROL 0.5-2.5 (3) MG/3ML IN SOLN
3.0000 mL | Freq: Once | RESPIRATORY_TRACT | Status: AC
  Administered 2013-12-02: 3 mL via RESPIRATORY_TRACT

## 2013-12-02 MED ORDER — ALBUTEROL SULFATE (2.5 MG/3ML) 0.083% IN NEBU
5.0000 mg | INHALATION_SOLUTION | Freq: Once | RESPIRATORY_TRACT | Status: DC
  Filled 2013-12-02: qty 6

## 2013-12-02 MED ORDER — ALBUTEROL (5 MG/ML) CONTINUOUS INHALATION SOLN
10.0000 mg/h | INHALATION_SOLUTION | Freq: Once | RESPIRATORY_TRACT | Status: AC
  Administered 2013-12-02: 10 mg/h via RESPIRATORY_TRACT
  Filled 2013-12-02: qty 20

## 2013-12-02 MED ORDER — PREDNISONE 20 MG PO TABS
60.0000 mg | ORAL_TABLET | Freq: Once | ORAL | Status: AC
  Administered 2013-12-02: 60 mg via ORAL
  Filled 2013-12-02: qty 3

## 2013-12-02 MED ORDER — MAGNESIUM SULFATE 40 MG/ML IJ SOLN
2.0000 g | Freq: Once | INTRAMUSCULAR | Status: AC
  Administered 2013-12-02: 2 g via INTRAVENOUS
  Filled 2013-12-02: qty 50

## 2013-12-02 MED ORDER — IPRATROPIUM BROMIDE 0.02 % IN SOLN
0.5000 mg | Freq: Once | RESPIRATORY_TRACT | Status: AC
  Administered 2013-12-02: 0.5 mg via RESPIRATORY_TRACT
  Filled 2013-12-02: qty 2.5

## 2013-12-02 NOTE — ED Provider Notes (Signed)
Complains of wheezing cough and shortness of breath onset 3 days ago. No fever. No other associated symptoms. On exam lungs longus per day for expiratory wheezes, speaks in sentences. Mild respiratory distress. Coughing frequently.  Orlie Dakin, MD 12/02/13 2124

## 2013-12-02 NOTE — ED Notes (Signed)
Went to give patient more Ativan.  Pt resting comfortably at this time.  Husband reports he doesn't think she needs anymore. Spoke with PA and PA ok'd to hold ativan for now.

## 2013-12-02 NOTE — ED Provider Notes (Signed)
Medical screening examination/treatment/procedure(s) were conducted as a shared visit with non-physician practitioner(s) and myself.  I personally evaluated the patient during the encounter.  EKG Interpretation    Date/Time:  Sunday December 02 2013 18:49:32 EST Ventricular Rate:  90 PR Interval:  146 QRS Duration: 93 QT Interval:  373 QTC Calculation: 456 R Axis:   62 Text Interpretation:  Sinus rhythm Low voltage, precordial leads No significant change since last tracing Confirmed by Winfred Leeds  MD, Rorie Delmore ([id number]) on 12/02/2013 10:52:28 PM             Orlie Dakin, MD 12/02/13 2354

## 2013-12-02 NOTE — ED Provider Notes (Signed)
CSN: [id number]     Arrival date & time 12/02/13  1842 History   First MD Initiated Contact with Patient 12/02/13 1928     Chief Complaint  Patient presents with  . Shortness of Breath  . Chest Pain     (Consider location/radiation/quality/duration/timing/severity/associated sxs/prior Treatment) HPI [name] is a 53 y.o. female who presents to ED with complaint of cough, chest tightness, shortness of breath, wheezing. Patient states her symptoms began a week ago. She was seen at [facility] ER and again by her primary care Dr. 3 days ago. She was told this is most likely exacerbation of her CircAid doses and bronchitis, she's currently being treated with albuterol, and then tonight, and excellent, and Hycodan, and she is on constant prednisone dose of 10 mg daily. She states when she was at [facility], her symptoms improved after Ativan. She was told that this is most likely related to an acid reflux. Patient states that she currently is not having any issues with acid reflux and has seen her gastroenterologist who told her that she had no evidence of acid reflux. Patient states that she's followed by Dr. Joya Gaskins, pulmonologist, who has admitted her in the past for similar presentation. Patient states she feels like she can't get enough air. She denies any fever. She states her cough is nonproductive. She reports back pain is worse with coughing.  Past Medical History  Diagnosis Date  . DIABETES MELLITUS, TYPE II 08/21/2006  . HYPERLIPIDEMIA 08/21/2006  . GOUT 08/20/2010  . OBESITY 06/04/2009  . ANEMIA-UNSPECIFIED 09/18/2009  . HYPERTENSION 08/21/2006  . GERD 08/21/2006  . SLEEP APNEA 04/21/2009    last testing was negative  . Internal hemorrhoids   . Pulmonary sarcoidosis     Followed locally by pulmonology, but also by Dr. Casper Harrison at [facility]  . Exertional chest pain     sharp, substernal, exertional  . Vocal cord dysfunction   . Chronic diastolic heart failure, NYHA  class 2     LVEDP roughly 20% by cath  . CHF (congestive heart failure)    Past Surgical History  Procedure Laterality Date  . Ventral hernia repair    . Nissen fundoplication  0272  . Cholecystectomy  1984  . Abdominal hysterectomy    . Knee arthroscopy      right  . Tubal ligation      with reversal in 1994  . Bladder suspension  11/11/2011    Procedure: TRANSVAGINAL TAPE (TVT) PROCEDURE;  Surgeon: Olga Millers, MD;  Location: [facility];  Service: Gynecology;  Laterality: N/A;  . Cystoscopy  11/11/2011    Procedure: CYSTOSCOPY;  Surgeon: Olga Millers, MD;  Location: [facility];  Service: Gynecology;  Laterality: N/A;  . Doppler echocardiography  02/12/2013    LV FUNCTION, SIZE NORMAL; MILD CONCENTRIC LVH; EST EF 55-65%; WALL MOTION NORMAL  . Lexiscan myoview  03/09/2013    EF 50%; NORMAL MYOCARDIAL PERFUSION STUDY - breast attenuation  . Cardiac catheterization  07/2010    LVEF 50-55% WITH VERY MILD GLOBAL HYPOKINESIA; ESSENTIALLY NORMAL CORONARY ARTERIES; NORMAL LV FUNCTION  . Carotids  02/18/11    CAROTID DUPLEX; VERTEBRALS ARE PATENT WITH ANTEGRADE FLOW. ICA/CCA RATIO 1.61 ON RIGHT AND 0.75 ON LEFT  . Right and left cardiac catheterization  04/23/2013    Angiographic normal coronaries; LVEDP 20 mmHg, PCWP 12-14 mmHg, RAP 12 mmHg.; Fick CO/CI 4.9/2.2   Family History  Problem Relation Age of Onset  . Diabetes Father   .  Heart attack Father   . Coronary artery disease Father   . Heart failure Father   . COPD Mother   . Emphysema Mother   . Asthma Mother   . Heart failure Mother   . Sarcoidosis Maternal Uncle   . Colon cancer Neg Hx   . Lung cancer Brother   . Cancer Brother   . Diabetes Brother   . Heart attack Maternal Grandfather    History  Substance Use Topics  . Smoking status: Never Smoker   . Smokeless tobacco: Never Used  . Alcohol Use: No   OB History   Grav Para Term Preterm Abortions TAB SAB Ect Mult Living                 Review of Systems   Constitutional: Negative for fever and chills.  Respiratory: Positive for cough, chest tightness, shortness of breath, wheezing and stridor.   Cardiovascular: Negative for chest pain, palpitations and leg swelling.  Gastrointestinal: Negative for nausea, vomiting, abdominal pain and diarrhea.  Genitourinary: Negative for dysuria, flank pain, vaginal bleeding, vaginal discharge, vaginal pain and pelvic pain.  Musculoskeletal: Negative for arthralgias, myalgias, neck pain and neck stiffness.  Skin: Negative for rash.  Neurological: Negative for dizziness, weakness and headaches.  All other systems reviewed and are negative.      Allergies  Lisinopril; Methotrexate; and Clindamycin/lincomycin  Home Medications   Current Outpatient Rx  Name  Route  Sig  Dispense  Refill  . albuterol (PROVENTIL HFA;VENTOLIN HFA) 108 (90 BASE) MCG/ACT inhaler   Inhalation   Inhale 2 puffs into the lungs every 6 (six) hours as needed. For wheezing.         [name] albuterol (PROVENTIL) (5 MG/ML) 0.5% nebulizer solution   Nebulization   Take 2.5 mg by nebulization every 6 (six) hours as needed. For wheezing.         [name] allopurinol (ZYLOPRIM) 100 MG tablet   Oral   Take 100 mg by mouth at bedtime.          [name] aspirin EC 81 MG tablet   Oral   Take 81 mg by mouth daily.          [name] atorvastatin (LIPITOR) 40 MG tablet   Oral   Take 40 mg by mouth at bedtime.         [name] azithromycin (ZITHROMAX) 250 MG tablet      250 mg once. Albertha Ghee, Wed, Friday as maintenance         . B-D ULTRAFINE III SHORT PEN 31G X 8 MM MISC      Follow directions provided by physician   30 each   2   . benzonatate (TESSALON) 200 MG capsule   Oral   Take 1 capsule (200 mg total) by mouth 3 (three) times daily.   20 capsule   0   . buPROPion (WELLBUTRIN XL) 300 MG 24 hr tablet   Oral   Take 300 mg by mouth daily.         . carvedilol (COREG) 25 MG tablet   Oral   Take 1 tablet (25 mg total) by mouth 2  (two) times daily with a meal.   180 tablet   3   . cholecalciferol (VITAMIN D) 1000 UNITS tablet   Oral   Take 1,000 Units by mouth 2 (two) times daily.         . ciclesonide (OMNARIS) 50 MCG/ACT nasal spray   Each Nare  Place 1 spray into both nostrils daily.          [name] DEXILANT 60 MG capsule      Take one capsule by mouth one time daily   30 capsule   5   . diltiazem (CARDIZEM CD) 120 MG 24 hr capsule   Oral   Take 1 capsule (120 mg total) by mouth every evening.   30 capsule   11   . estradiol (ESTRACE) 2 MG tablet   Oral   Take 2 mg by mouth at bedtime.          . fenofibrate (TRICOR) 145 MG tablet      Take one tablet by mouth one time daily   30 tablet   10   . furosemide (LASIX) 40 MG tablet   Oral   Take 1 tablet (40 mg total) by mouth 2 (two) times daily. Take additional 40 mg in AM of 7/15 & 7/16 - to shoot for dry wgt of ~2 lb below current weight.   45 tablet   12   . glucose blood (ONE TOUCH ULTRA TEST) test strip   Other   1 each by Other route 3 (three) times daily. Use as instructed   100 each   5   . glyBURIDE (DIABETA) 2.5 MG tablet      TAKE 1 TABLET BY MOUTH DAILY WITH BREAKFAST.   30 tablet   2   . HYDROcodone-homatropine (HYCODAN) 5-1.5 MG/5ML syrup   Oral   Take 5 mLs by mouth every 6 (six) hours as needed for cough.   120 mL   0   . insulin glargine (LANTUS) 100 UNIT/ML injection   Subcutaneous   Inject 23 Units into the skin at bedtime.         [name] LORazepam (ATIVAN) 0.5 MG tablet   Oral   Take 1 tablet (0.5 mg total) by mouth 2 (two) times daily as needed for anxiety.   30 tablet   1   . losartan (COZAAR) 100 MG tablet   Oral   Take 100 mg by mouth at bedtime.         . metFORMIN (GLUCOPHAGE) 500 MG tablet   Oral   Take 500 mg by mouth 2 (two) times daily with a meal.         . mometasone-formoterol (DULERA) 200-5 MCG/ACT AERO   Inhalation   Inhale 2 puffs into the lungs 2 (two) times daily.   1  Inhaler   6   . omeprazole (PRILOSEC) 20 MG capsule   Oral   Take 1 capsule (20 mg total) by mouth 2 (two) times daily before a meal.   90 capsule   1   . potassium chloride SA (K-DUR,KLOR-CON) 20 MEQ tablet   Oral   Take 30 mEq by mouth 2 (two) times daily.         . predniSONE (DELTASONE) 5 MG tablet   Oral   Take 10 mg by mouth every morning.          . sucralfate (CARAFATE) 1 GM/10ML suspension   Oral   Take 10 mLs (1 g total) by mouth 4 (four) times daily -  with meals and at bedtime.   420 mL   0   . venlafaxine XR (EFFEXOR-XR) 75 MG 24 hr capsule   Oral   Take 150 mg by mouth at bedtime.         . nitroGLYCERIN (NITROSTAT) 0.4 MG SL tablet  Sublingual   Place 0.4 mg under the tongue every 5 (five) minutes as needed. x3 doses as needed for chest pain.          BP 150/84  Pulse 93  Temp(Src) 97.6 F (36.4 C) (Oral)  Resp 22  SpO2 98% Physical Exam  Nursing note and vitals reviewed. Constitutional: She appears well-developed and well-nourished. No distress.  HENT:  Head: Normocephalic.  Eyes: Conjunctivae are normal.  Neck: Neck supple.  Cardiovascular: Normal rate, regular rhythm and normal heart sounds.   Pulmonary/Chest: Effort normal. She has wheezes. She has no rales.  Lungs clear with decreased air movement bilaterally. There is auditory expiratory wheezes that seem to come from the trachea  Abdominal: Soft. Bowel sounds are normal. She exhibits no distension. There is no tenderness. There is no rebound.  Musculoskeletal: She exhibits no edema.  Neurological: She is alert.  Skin: Skin is warm and dry.  Psychiatric: She has a normal mood and affect. Her behavior is normal.    ED Course  Procedures (including critical care time) Labs Review Labs Reviewed  CBC WITH DIFFERENTIAL - Abnormal; Notable for the following:    WBC 15.0 (*)    Hemoglobin 11.5 (*)    HCT 34.4 (*)    Platelets 413 (*)    Neutro Abs 10.5 (*)    All other components  within normal limits  BASIC METABOLIC PANEL - Abnormal; Notable for the following:    Sodium 135 (*)    Chloride 91 (*)    Glucose, Bld 281 (*)    All other components within normal limits  CBG MONITORING, ED - Abnormal; Notable for the following:    Glucose-Capillary 237 (*)    All other components within normal limits  PRO B NATRIURETIC PEPTIDE  BLOOD GAS, ARTERIAL  I-STAT TROPOININ, ED   Imaging Review Dg Chest 2 View  12/02/2013   CLINICAL DATA:  Shortness of breath, wheezing and chest pain.  EXAM: CHEST  2 VIEW  COMPARISON:  Chest radiograph performed 11/29/2013  FINDINGS: The lungs are hypoexpanded. Vascular crowding is noted. Mild left basilar opacity likely reflects atelectasis. No definite pleural effusion or pneumothorax is seen.  The heart is borderline enlarged. No acute osseous abnormalities are seen. Postoperative change is noted overlying the gastroesophageal junction.  IMPRESSION: Lungs hypoexpanded; mild left basilar airspace opacity likely reflects atelectasis. Borderline cardiomegaly noted.   Electronically Signed   By: Garald Balding M.D.   On: 12/02/2013 21:23    EKG Interpretation    Date/Time:  Sunday December 02 2013 18:49:32 EST Ventricular Rate:  90 PR Interval:  146 QRS Duration: 93 QT Interval:  373 QTC Calculation: 456 R Axis:   62 Text Interpretation:  Sinus rhythm Low voltage, precordial leads No significant change since last tracing Confirmed by JACUBOWITZ  MD, SAM ([id number]) on 12/02/2013 10:52:28 PM            MDM   Final diagnoses:  COPD (chronic obstructive pulmonary disease)   Pt in respiratory distress. Appears anxious. Expiratory audible wheezes however lungs appear clear. VS normal. Will get labs, CXR, nebs started, will give prednisone 60mg  po. She is afebrile, cough non productive, do not think pneumonia at this time, will hold antibiotics.    10:46 PM [name] not improving with regular and hour long nebs. She continues to have auditory  wheezing. She is getting fatigued breathing. Will try magnesium IV. Spoke with [facility], asked for ABG. VS remain stable.   Filed Vitals:  12/02/13 1854 12/02/13 2117 12/02/13 2152 12/02/13 2239  BP: 150/84  157/61   Pulse: 93  90   Temp: 97.6 F (36.4 C)     TempSrc: Oral     Resp: 22  24   SpO2: 98% 100% 100% 99%   11:50 PM Based on ABG most likely hyperventilating. Spoke with Dr. Jasper Loser who is admitting pt, asked to try higher dose of ativan. Pt already received 0.5mg , added another 0.5, however, when went back to look at pt, she is feeling better and is sleeping. Will hold dose.     Renold Genta, PA-C 12/02/13 2352

## 2013-12-02 NOTE — ED Notes (Signed)
Pt reports pulmonary sarcoidosis. Pt reports non productive cough since Tuesday. Was treated Wednesday night at [facility] for acid reflux. Went to pcp on Thursday, received cough medicines, no relief from cough or SOB. SOB increased, now with chest pain, pt thinks from coughing. Pain 7/10. 98% on room air. Able to speak in short sentences. Slight wheezing heard.

## 2013-12-02 NOTE — ED Notes (Signed)
Pt. Placed on 2 liter of oxygen per [place], PA.

## 2013-12-03 ENCOUNTER — Encounter (HOSPITAL_COMMUNITY): Payer: Self-pay | Admitting: Internal Medicine

## 2013-12-03 DIAGNOSIS — J45901 Unspecified asthma with (acute) exacerbation: Secondary | ICD-10-CM | POA: Diagnosis not present

## 2013-12-03 DIAGNOSIS — J449 Chronic obstructive pulmonary disease, unspecified: Secondary | ICD-10-CM | POA: Diagnosis not present

## 2013-12-03 DIAGNOSIS — R079 Chest pain, unspecified: Secondary | ICD-10-CM | POA: Diagnosis not present

## 2013-12-03 DIAGNOSIS — D869 Sarcoidosis, unspecified: Secondary | ICD-10-CM

## 2013-12-03 DIAGNOSIS — J383 Other diseases of vocal cords: Secondary | ICD-10-CM

## 2013-12-03 DIAGNOSIS — R059 Cough, unspecified: Secondary | ICD-10-CM | POA: Diagnosis not present

## 2013-12-03 DIAGNOSIS — R05 Cough: Secondary | ICD-10-CM | POA: Diagnosis not present

## 2013-12-03 LAB — EXPECTORATED SPUTUM ASSESSMENT W GRAM STAIN, RFLX TO RESP C

## 2013-12-03 LAB — TROPONIN I
Troponin I: <0.3 ng/mL (ref <0.30)
Troponin I: <0.3 ng/mL (ref <0.30)

## 2013-12-03 LAB — GLUCOSE, CAPILLARY
Glucose-Capillary: 274 mg/dL — ABNORMAL HIGH (ref 70–99)
Glucose-Capillary: 301 mg/dL — ABNORMAL HIGH (ref 70–99)
Glucose-Capillary: 319 mg/dL — ABNORMAL HIGH (ref 70–99)
Glucose-Capillary: 332 mg/dL — ABNORMAL HIGH (ref 70–99)
Glucose-Capillary: 342 mg/dL — ABNORMAL HIGH (ref 70–99)

## 2013-12-03 LAB — D-DIMER, QUANTITATIVE: D-Dimer, Quant: 0.31 ug/mL-FEU (ref 0.00–0.48)

## 2013-12-03 LAB — C-REACTIVE PROTEIN: CRP: 2.8 mg/dL — ABNORMAL HIGH (ref <0.60)

## 2013-12-03 LAB — SEDIMENTATION RATE: Sed Rate: 26 mm/hr — ABNORMAL HIGH (ref 0–22)

## 2013-12-03 LAB — MRSA PCR SCREENING: MRSA by PCR: NEGATIVE

## 2013-12-03 MED ORDER — FENOFIBRATE 160 MG PO TABS
160.0000 mg | ORAL_TABLET | Freq: Every day | ORAL | Status: DC
  Administered 2013-12-03 – 2013-12-05 (×3): 160 mg via ORAL
  Filled 2013-12-03 (×3): qty 1

## 2013-12-03 MED ORDER — ONDANSETRON HCL 4 MG/2ML IJ SOLN: 4.0000 mg | Freq: Four times a day (QID) | INTRAMUSCULAR | Status: DC | PRN

## 2013-12-03 MED ORDER — DM-GUAIFENESIN ER 30-600 MG PO TB12
1.0000 | ORAL_TABLET | Freq: Two times a day (BID) | ORAL | Status: DC
  Administered 2013-12-03 – 2013-12-05 (×5): 1 via ORAL
  Filled 2013-12-03 (×6): qty 1

## 2013-12-03 MED ORDER — ALLOPURINOL 100 MG PO TABS
100.0000 mg | ORAL_TABLET | Freq: Every day | ORAL | Status: DC
  Administered 2013-12-03 – 2013-12-04 (×3): 100 mg via ORAL
  Filled 2013-12-03 (×4): qty 1

## 2013-12-03 MED ORDER — PREDNISONE 20 MG PO TABS
40.0000 mg | ORAL_TABLET | Freq: Once | ORAL | Status: AC
  Administered 2013-12-04: 40 mg via ORAL
  Filled 2013-12-03: qty 2

## 2013-12-03 MED ORDER — PANTOPRAZOLE SODIUM 40 MG PO TBEC
40.0000 mg | DELAYED_RELEASE_TABLET | Freq: Every day | ORAL | Status: DC
  Administered 2013-12-03 – 2013-12-05 (×3): 40 mg via ORAL
  Filled 2013-12-03 (×3): qty 1

## 2013-12-03 MED ORDER — PREDNISONE 50 MG PO TABS
50.0000 mg | ORAL_TABLET | Freq: Once | ORAL | Status: DC
  Filled 2013-12-03: qty 1

## 2013-12-03 MED ORDER — PREDNISONE 10 MG PO TABS: 10.0000 mg | ORAL_TABLET | Freq: Once | ORAL | Status: DC

## 2013-12-03 MED ORDER — VENLAFAXINE HCL ER 150 MG PO CP24
150.0000 mg | ORAL_CAPSULE | Freq: Every day | ORAL | Status: DC
  Administered 2013-12-03 – 2013-12-04 (×3): 150 mg via ORAL
  Filled 2013-12-03 (×4): qty 1

## 2013-12-03 MED ORDER — GUAIFENESIN-DM 100-10 MG/5ML PO SYRP
5.0000 mL | ORAL_SOLUTION | ORAL | Status: DC | PRN
  Administered 2013-12-04: 5 mL via ORAL
  Filled 2013-12-03: qty 10

## 2013-12-03 MED ORDER — INSULIN GLARGINE 100 UNIT/ML ~~LOC~~ SOLN
23.0000 [IU] | Freq: Every day | SUBCUTANEOUS | Status: DC
  Administered 2013-12-03: 23 [IU] via SUBCUTANEOUS
  Filled 2013-12-03 (×2): qty 0.23

## 2013-12-03 MED ORDER — CARVEDILOL 25 MG PO TABS
25.0000 mg | ORAL_TABLET | Freq: Two times a day (BID) | ORAL | Status: DC
  Administered 2013-12-03 – 2013-12-05 (×4): 25 mg via ORAL
  Filled 2013-12-03 (×7): qty 1

## 2013-12-03 MED ORDER — FUROSEMIDE 40 MG PO TABS
40.0000 mg | ORAL_TABLET | Freq: Two times a day (BID) | ORAL | Status: DC
  Administered 2013-12-03 – 2013-12-05 (×4): 40 mg via ORAL
  Filled 2013-12-03 (×7): qty 1

## 2013-12-03 MED ORDER — METHYLPREDNISOLONE SODIUM SUCC 40 MG IJ SOLR
40.0000 mg | Freq: Two times a day (BID) | INTRAMUSCULAR | Status: DC
  Administered 2013-12-03 (×2): 40 mg via INTRAVENOUS
  Filled 2013-12-03 (×2): qty 1

## 2013-12-03 MED ORDER — SUCRALFATE 1 GM/10ML PO SUSP
1.0000 g | Freq: Three times a day (TID) | ORAL | Status: DC
  Administered 2013-12-03 – 2013-12-05 (×9): 1 g via ORAL
  Filled 2013-12-03 (×13): qty 10

## 2013-12-03 MED ORDER — LEVOFLOXACIN 500 MG PO TABS
500.0000 mg | ORAL_TABLET | Freq: Every day | ORAL | Status: DC
  Administered 2013-12-03 – 2013-12-05 (×3): 500 mg via ORAL
  Filled 2013-12-03 (×4): qty 1

## 2013-12-03 MED ORDER — IPRATROPIUM-ALBUTEROL 0.5-2.5 (3) MG/3ML IN SOLN
3.0000 mL | Freq: Four times a day (QID) | RESPIRATORY_TRACT | Status: DC
  Administered 2013-12-03 – 2013-12-05 (×9): 3 mL via RESPIRATORY_TRACT
  Filled 2013-12-03 (×9): qty 3

## 2013-12-03 MED ORDER — INSULIN ASPART 100 UNIT/ML ~~LOC~~ SOLN
0.0000 [IU] | Freq: Three times a day (TID) | SUBCUTANEOUS | Status: DC
Start: 2013-12-03 — End: 2013-12-04
  Administered 2013-12-03 (×3): 11 [IU] via SUBCUTANEOUS
  Administered 2013-12-04: 5 [IU] via SUBCUTANEOUS
  Administered 2013-12-04: 8 [IU] via SUBCUTANEOUS

## 2013-12-03 MED ORDER — PREDNISONE 20 MG PO TABS
30.0000 mg | ORAL_TABLET | Freq: Once | ORAL | Status: AC
  Administered 2013-12-05: 30 mg via ORAL
  Filled 2013-12-03: qty 1

## 2013-12-03 MED ORDER — METFORMIN HCL 500 MG PO TABS
500.0000 mg | ORAL_TABLET | Freq: Two times a day (BID) | ORAL | Status: DC
  Administered 2013-12-03 – 2013-12-05 (×4): 500 mg via ORAL
  Filled 2013-12-03 (×7): qty 1

## 2013-12-03 MED ORDER — SODIUM CHLORIDE 0.9 % IJ SOLN
3.0000 mL | Freq: Two times a day (BID) | INTRAMUSCULAR | Status: DC
  Administered 2013-12-03 – 2013-12-04 (×2): 3 mL via INTRAVENOUS

## 2013-12-03 MED ORDER — LEVALBUTEROL HCL 0.63 MG/3ML IN NEBU
0.6300 mg | INHALATION_SOLUTION | RESPIRATORY_TRACT | Status: DC | PRN
  Administered 2013-12-03: 0.63 mg via RESPIRATORY_TRACT
  Filled 2013-12-03: qty 3

## 2013-12-03 MED ORDER — BENZONATATE 100 MG PO CAPS
200.0000 mg | ORAL_CAPSULE | Freq: Three times a day (TID) | ORAL | Status: DC
  Administered 2013-12-03 – 2013-12-05 (×6): 200 mg via ORAL
  Filled 2013-12-03 (×9): qty 2

## 2013-12-03 MED ORDER — DILTIAZEM HCL ER COATED BEADS 120 MG PO CP24
120.0000 mg | ORAL_CAPSULE | Freq: Every evening | ORAL | Status: DC
  Administered 2013-12-03: 120 mg via ORAL
  Filled 2013-12-03 (×3): qty 1

## 2013-12-03 MED ORDER — ALBUTEROL SULFATE (2.5 MG/3ML) 0.083% IN NEBU
2.5000 mg | INHALATION_SOLUTION | Freq: Four times a day (QID) | RESPIRATORY_TRACT | Status: DC
  Administered 2013-12-03: 2.5 mg via RESPIRATORY_TRACT
  Filled 2013-12-03: qty 3

## 2013-12-03 MED ORDER — LEVOFLOXACIN IN D5W 750 MG/150ML IV SOLN
750.0000 mg | INTRAVENOUS | Status: DC
  Administered 2013-12-03: 750 mg via INTRAVENOUS
  Filled 2013-12-03 (×2): qty 150

## 2013-12-03 MED ORDER — SODIUM CHLORIDE 0.9 % IV SOLN: 250.0000 mL | INTRAVENOUS | Status: DC | PRN

## 2013-12-03 MED ORDER — BIOTENE DRY MOUTH MT LIQD
15.0000 mL | Freq: Two times a day (BID) | OROMUCOSAL | Status: DC
Start: 2013-12-03 — End: 2013-12-05
  Administered 2013-12-03 – 2013-12-05 (×4): 15 mL via OROMUCOSAL

## 2013-12-03 MED ORDER — POTASSIUM CHLORIDE CRYS ER 20 MEQ PO TBCR
30.0000 meq | EXTENDED_RELEASE_TABLET | Freq: Two times a day (BID) | ORAL | Status: DC
  Administered 2013-12-03 – 2013-12-05 (×6): 30 meq via ORAL
  Filled 2013-12-03 (×7): qty 1

## 2013-12-03 MED ORDER — SODIUM CHLORIDE 0.9 % IJ SOLN
3.0000 mL | Freq: Two times a day (BID) | INTRAMUSCULAR | Status: DC
  Administered 2013-12-04: 3 mL via INTRAVENOUS

## 2013-12-03 MED ORDER — ASPIRIN EC 81 MG PO TBEC
81.0000 mg | DELAYED_RELEASE_TABLET | Freq: Every day | ORAL | Status: DC
  Administered 2013-12-03 – 2013-12-05 (×3): 81 mg via ORAL
  Filled 2013-12-03 (×3): qty 1

## 2013-12-03 MED ORDER — LORAZEPAM 0.5 MG PO TABS
0.5000 mg | ORAL_TABLET | Freq: Two times a day (BID) | ORAL | Status: DC | PRN
  Administered 2013-12-03: 0.5 mg via ORAL
  Filled 2013-12-03: qty 1

## 2013-12-03 MED ORDER — BUPROPION HCL ER (XL) 300 MG PO TB24
300.0000 mg | ORAL_TABLET | Freq: Every day | ORAL | Status: DC
  Administered 2013-12-03 – 2013-12-05 (×3): 300 mg via ORAL
  Filled 2013-12-03 (×3): qty 1

## 2013-12-03 MED ORDER — INSULIN ASPART 100 UNIT/ML ~~LOC~~ SOLN
0.0000 [IU] | Freq: Every day | SUBCUTANEOUS | Status: DC
Start: 2013-12-03 — End: 2013-12-04
  Administered 2013-12-03: 3 [IU] via SUBCUTANEOUS
  Administered 2013-12-03: 4 [IU] via SUBCUTANEOUS

## 2013-12-03 MED ORDER — SODIUM CHLORIDE 0.9 % IJ SOLN: 3.0000 mL | INTRAMUSCULAR | Status: DC | PRN

## 2013-12-03 MED ORDER — ATORVASTATIN CALCIUM 40 MG PO TABS
40.0000 mg | ORAL_TABLET | Freq: Every day | ORAL | Status: DC
  Administered 2013-12-03 – 2013-12-04 (×3): 40 mg via ORAL
  Filled 2013-12-03 (×4): qty 1

## 2013-12-03 MED ORDER — ACETAMINOPHEN 325 MG PO TABS
650.0000 mg | ORAL_TABLET | Freq: Four times a day (QID) | ORAL | Status: DC | PRN
  Administered 2013-12-03 – 2013-12-05 (×3): 650 mg via ORAL
  Filled 2013-12-03 (×3): qty 2

## 2013-12-03 MED ORDER — LOSARTAN POTASSIUM 50 MG PO TABS
100.0000 mg | ORAL_TABLET | Freq: Every day | ORAL | Status: DC
  Administered 2013-12-03 – 2013-12-04 (×3): 100 mg via ORAL
  Filled 2013-12-03 (×4): qty 2

## 2013-12-03 MED ORDER — GLYBURIDE 2.5 MG PO TABS
2.5000 mg | ORAL_TABLET | Freq: Every day | ORAL | Status: DC
  Administered 2013-12-03: 2.5 mg via ORAL
  Filled 2013-12-03 (×2): qty 1

## 2013-12-03 MED ORDER — HYDROCODONE-HOMATROPINE 5-1.5 MG/5ML PO SYRP: 5.0000 mL | ORAL_SOLUTION | Freq: Four times a day (QID) | ORAL | Status: DC | PRN

## 2013-12-03 MED ORDER — FLUTICASONE PROPIONATE 50 MCG/ACT NA SUSP
1.0000 | Freq: Every day | NASAL | Status: DC
  Administered 2013-12-03 – 2013-12-05 (×3): 1 via NASAL
  Filled 2013-12-03: qty 16

## 2013-12-03 MED ORDER — PREDNISONE 50 MG PO TABS
50.0000 mg | ORAL_TABLET | Freq: Once | ORAL | Status: AC
  Administered 2013-12-03: 50 mg via ORAL
  Filled 2013-12-03: qty 1

## 2013-12-03 MED ORDER — MOMETASONE FURO-FORMOTEROL FUM 200-5 MCG/ACT IN AERO
2.0000 | INHALATION_SPRAY | Freq: Two times a day (BID) | RESPIRATORY_TRACT | Status: DC
  Administered 2013-12-03 – 2013-12-05 (×5): 2 via RESPIRATORY_TRACT
  Filled 2013-12-03: qty 8.8

## 2013-12-03 MED ORDER — VITAMIN D3 25 MCG (1000 UNIT) PO TABS
1000.0000 [IU] | ORAL_TABLET | Freq: Two times a day (BID) | ORAL | Status: DC
  Administered 2013-12-03 – 2013-12-05 (×5): 1000 [IU] via ORAL
  Filled 2013-12-03 (×6): qty 1

## 2013-12-03 MED ORDER — NITROGLYCERIN 0.4 MG SL SUBL: 0.4000 mg | SUBLINGUAL_TABLET | SUBLINGUAL | Status: DC | PRN

## 2013-12-03 MED ORDER — ONDANSETRON HCL 4 MG PO TABS: 4.0000 mg | ORAL_TABLET | Freq: Four times a day (QID) | ORAL | Status: DC | PRN

## 2013-12-03 MED ORDER — ENOXAPARIN SODIUM 40 MG/0.4ML ~~LOC~~ SOLN
40.0000 mg | SUBCUTANEOUS | Status: DC
  Administered 2013-12-03 – 2013-12-05 (×3): 40 mg via SUBCUTANEOUS
  Filled 2013-12-03 (×3): qty 0.4

## 2013-12-03 MED ORDER — ACETAMINOPHEN 650 MG RE SUPP: 650.0000 mg | Freq: Four times a day (QID) | RECTAL | Status: DC | PRN

## 2013-12-03 MED ORDER — PREDNISONE 20 MG PO TABS
40.0000 mg | ORAL_TABLET | Freq: Every day | ORAL | Status: DC
  Filled 2013-12-03: qty 2

## 2013-12-03 MED ORDER — PREDNISONE 20 MG PO TABS
20.0000 mg | ORAL_TABLET | Freq: Once | ORAL | Status: DC
  Filled 2013-12-03: qty 1

## 2013-12-03 MED ORDER — IPRATROPIUM BROMIDE 0.02 % IN SOLN
0.5000 mg | Freq: Four times a day (QID) | RESPIRATORY_TRACT | Status: DC
  Administered 2013-12-03 (×2): 0.5 mg via RESPIRATORY_TRACT
  Filled 2013-12-03 (×2): qty 2.5

## 2013-12-03 MED ORDER — ESTRADIOL 2 MG PO TABS
2.0000 mg | ORAL_TABLET | Freq: Every day | ORAL | Status: DC
  Administered 2013-12-03 – 2013-12-04 (×3): 2 mg via ORAL
  Filled 2013-12-03 (×4): qty 1

## 2013-12-03 NOTE — Progress Notes (Signed)
Patient ID: [name], female   DOB: 12-02-1960, 53 y.o.   MRN: [id number]  [facility] PROGRESS NOTE  ANALEIGH ARIES MLY:[id number] DOB: October 18, 1960 DOA: 12/02/2013 PCP: Chancy Hurter, MD  Brief narrative: 53 y.o. Female with sarcoidosis, asthmatic bronchitis (followed at [facility] and by Dr. Joya Gaskins), DM type II, HTN, ? VCD who presented to [facility] ED with main concern of one week duration of progressively worsening shortness of breath, exertional and at rest, associated with non productive cough, no specific alleviating or aggravating factors. No fevers, no urinary or abdominal concerns. ABG in ED --> 7.597/24.5/112/24  Active Problems:   Acute respiratory failure - pt clinically improving, maintaining oxygen sat's at target range  - d/c solumedrol and transition to oral Prednisone - continue BD's scheduled and as needed, Levaquin day #2/5 - sputum analysis pending - CT chest pending  - appreciate PCCM input    HYPERTENSION - continue Coreg, Losartan, Lasix, Cardizem     Type II diabetes mellitus  - on metformin, insulin Lantus - added SSI while inpatient    Solitary pulmonary nodule, on CT 02/2013  - CT chest pending    Chronic diastolic heart failure, NYHA class 2 - last 2 D ECHO 02/2014 with normal EF 55% but did not clearly identified diastolic failure - continue Lasix, monitor weight, today 258 lbs   Sarcoidosis - outpatient follow up    HLD - continue statin   Consultants:  PCCM Procedures/Studies:  CXR   12/02/2013  Lungs hypoexpanded; left basilar airspace opacity, c/w atelectasis Antibiotics:  Levaquin 02/22 -->  Code Status: Full Family Communication: Pt at bedside Disposition Plan: Transfer to medical floor   HPI/Subjective: No events overnight.   Objective: Filed Vitals:   12/03/13 1000 12/03/13 1100 12/03/13 1200 12/03/13 1232  BP:    165/72  Pulse: 100 99 93 95  Temp:   97.9 F (36.6 C)   TempSrc:   Oral   Resp: 16 16    Height:       Weight:      SpO2: 98% 96% 98% 95%    Intake/Output Summary (Last 24 hours) at 12/03/13 1321 Last data filed at 12/03/13 0800  Gross per 24 hour  Intake    150 ml  Output   1450 ml  Net  -1300 ml    Exam:   General:  [name] alert, follows commands appropriately, not in acute distress  Cardiovascular: Regular rate and rhythm, S1/S2, no murmurs, no rubs, no gallops  Respiratory: Clear to auscultation bilaterally, mild end expiratory wheezing at bases   Abdomen: Soft, non tender, non distended, bowel sounds present, no guarding  Extremities: No edema, pulses DP and PT palpable bilaterally  Neuro: Grossly nonfocal  Data Reviewed: Basic Metabolic Panel:  Recent Labs Lab 11/29/13 0150 12/02/13 2017  NA 137 135*  K 4.0 3.8  CL 95* 91*  CO2  --  25  GLUCOSE 291* 281*  BUN 18 20  CREATININE 0.80 0.70  CALCIUM  --  9.7   CBC:  Recent Labs Lab 11/29/13 0144 11/29/13 0150 12/02/13 2017  WBC 14.0*  --  15.0*  NEUTROABS 9.0*  --  10.5*  HGB 11.5* 12.2 11.5*  HCT 34.7* 36.0 34.4*  MCV 89.2  --  88.4  PLT 379  --  413*   Cardiac Enzymes:  Recent Labs Lab 12/03/13 0126 12/03/13 0649  TROPONINI <0.30 <0.30   CBG:  Recent Labs Lab 12/02/13 2157 12/03/13 0309 12/03/13 0722 12/03/13 1141  GLUCAP 237* 274* 301* 319*    Recent Results (from the past 240 hour(s))  MRSA PCR SCREENING     Status: None   Collection Time    12/03/13  1:52 AM      Result Value Ref Range Status   MRSA by PCR NEGATIVE  NEGATIVE Final   Comment:            The GeneXpert MRSA Assay (FDA     approved for NASAL specimens     only), is one component of a     comprehensive MRSA colonization     surveillance program. It is not     intended to diagnose MRSA     infection nor to guide or     monitor treatment for     MRSA infections.     Scheduled Meds: . allopurinol  100 mg Oral QHS  . antiseptic oral rinse  15 mL Mouth Rinse BID  . aspirin EC  81 mg Oral Daily  .  atorvastatin  40 mg Oral QHS  . benzonatate  200 mg Oral TID  . buPROPion  300 mg Oral Daily  . carvedilol  25 mg Oral BID WC  . cholecalciferol  1,000 Units Oral BID  . dextromethorphan-guaiFENesin  1 tablet Oral BID  . diltiazem  120 mg Oral QPM  . enoxaparin (LOVENOX) injection  40 mg Subcutaneous Q24H  . estradiol  2 mg Oral QHS  . fenofibrate  160 mg Oral Daily  . fluticasone  1 spray Each Nare Daily  . furosemide  40 mg Oral BID  . glyBURIDE  2.5 mg Oral Q breakfast  . insulin aspart  0-15 Units Subcutaneous TID WC  . insulin aspart  0-5 Units Subcutaneous QHS  . insulin glargine  23 Units Subcutaneous QHS  . ipratropium-albuterol  3 mL Nebulization Q6H  . levofloxacin  500 mg Oral Daily  . losartan  100 mg Oral QHS  . metFORMIN  500 mg Oral BID WC  . methylPREDNISolone (SOLU-MEDROL) injection  40 mg Intravenous Q12H  . mometasone-formoterol  2 puff Inhalation BID  . pantoprazole  40 mg Oral Daily  . potassium chloride SA  30 mEq Oral BID  . [START ON 12/04/2013] predniSONE  40 mg Oral Q breakfast  . sodium chloride  3 mL Intravenous Q12H  . sodium chloride  3 mL Intravenous Q12H  . sucralfate  1 g Oral TID WC & HS  . venlafaxine XR  150 mg Oral QHS   Continuous Infusions:   Faye Ramsay, MD  [facility] Pager [phone]  If 7PM-7AM, please contact night-coverage [email or web address] Password [email or web address] 12/03/2013, 1:21 PM   LOS: 1 day

## 2013-12-03 NOTE — Progress Notes (Signed)
CARE MANAGEMENT NOTE 12/03/2013  Patient:  [name], [name]   Account Number:  [id number]  Date Initiated:  12/03/2013  Documentation initiated by:  Elesha Thedford  Subjective/Objective Assessment:   pt with hx of asthma and sarcoidosis of the lungs presents with resp distress, placed on bronchodilators and steroids     Action/Plan:   home when stable main pulmonary md is at [place] Date:  12/06/2013   Anticipated DC Plan:  HOME/SELF CARE  In-house referral  NA      DC Planning Services  NA      [facility] Choice  NA   Choice offered to / List presented to:  NA   DME arranged  NA      DME agency  NA     [place] arranged  NA      [place] agency  NA   Status of service:  In process, will continue to follow Medicare Important Message given?  NA - LOS <3 / Initial given by admissions (If response is "NO", the following Medicare IM given date fields will be blank) Date Medicare IM given:   Date Additional Medicare IM given:    Discharge Disposition:    Per UR Regulation:  Reviewed for med. necessity/level of care/duration of stay  If discussed at [place] of Stay Meetings, dates discussed:    Comments:  [id number]/Janasia Coverdale Eldridge Dace, [place], [place] [phone] Chart Reviewed for discharge and hospital needs. Discharge needs at time of review:  None present will follow for needs. Review of patient progress due on [id number].

## 2013-12-03 NOTE — Progress Notes (Signed)
Inpatient Diabetes Program Recommendations  AACE/ADA: New Consensus Statement on Inpatient Glycemic Control (2013)  Target Ranges:  Prepandial:   less than 140 mg/dL      Peak postprandial:   less than 180 mg/dL (1-2 hours)      Critically ill patients:  140 - 180 mg/dL   Reason for Visit: Hyperglycemia  Diabetes history: Type 2 Outpatient Diabetes medications: Lantus 23 units QHS and metformin 1000 mg bid Current orders for Inpatient glycemic control: Novolog mod tidwc and hs, Lantus 23 units QHS (to start tonight) and metformin 1000 mg bid    Results for [name], [name] (MRN [id number]) as of 12/03/2013 14:35  Ref. Range 11/08/2013 15:23 12/02/2013 21:57 12/03/2013 03:09 12/03/2013 07:22 12/03/2013 11:41  Glucose-Capillary Latest Range: 70-99 mg/dL 189 (H) 237 (H) 274 (H) 301 (H) 319 (H)   53 y.o. female who presents to ED with complaint of cough, chest tightness, shortness of breath, wheezing. Hx Type 2 DM on insulin at home. Hyperglycemia in the presence of steroids.  Recommendations: HgbA1C to assess glycemic control prior to hospitalization. Change diet to CHO mod medium Add meal coverage insulin - Novolog 4 units tidwc if pt eats >50% meal.  Will continue to follow. Thank you. [name], RD, LDN, CDE Inpatient Diabetes Coordinator [phone]

## 2013-12-03 NOTE — Consult Note (Addendum)
PULMONARY  / CRITICAL CARE MEDICINE CONSULTATION  Name: [name] MRN: [id number] DOB: 1960-10-13    ADMISSION DATE:  12/02/2013 REQUESTING PHYSICIAN: Dr. Dillard [place], [facility]  CHIEF COMPLAINT:  Shortness of Breath  BRIEF PATIENT DESCRIPTION: 38 F sarcoid and asthma who presents to [facility] with SOB which began on Tuesday. Upper airway predominant wheezing. PCCM asked for consult.  SIGNIFICANT EVENTS / STUDIES:  1. ABG 7.597/24.5/112/24  LINES / TUBES: 1. PIV  CULTURES: 1. Resp Viral Panel  ANTIBIOTICS: 1. None  HISTORY OF PRESENT ILLNESS:  [name] is a 53 yo F with Sarcoidosis and Asthma managed by Dr. Joya Gaskins who presents to [facility] with a  6 day history of worsening SOB. The SOB has been gradually getting worse over the course of the week. She gets some relief from her nebulizers but it has progressed to the point that she came in to be seen tonight. She presented to the ED earlier this week for SOB and was given symtpomatic treatment. She has a cough with associated pleuritic chest pain. She denies associated fevers, chills, sick contact, LE edema, increasing abdominal girth.   PAST MEDICAL HISTORY :  Past Medical History  Diagnosis Date  . DIABETES MELLITUS, TYPE II 08/21/2006  . HYPERLIPIDEMIA 08/21/2006  . GOUT 08/20/2010  . OBESITY 06/04/2009  . ANEMIA-UNSPECIFIED 09/18/2009  . HYPERTENSION 08/21/2006  . GERD 08/21/2006  . SLEEP APNEA 04/21/2009    last testing was negative  . Internal hemorrhoids   . Pulmonary sarcoidosis     Followed locally by pulmonology, but also by Dr. Cas[facility] at [facility]  . Exertional chest pain     sharp, substernal, exertional  . Vocal cord dysfunction   . Chronic diastolic heart failure, NYHA class 2     LVEDP roughly 20% by cath  . CHF (congestive heart failure)     Past Surgical History  Procedure Laterality Date  . Ventral hernia repair    . Nissen fundoplication  0109  . Cholecystectomy  1984  . Abdominal hysterectomy     . Knee arthroscopy      right  . Tubal ligation      with reversal in 1994  . Bladder suspension  11/11/2011    Procedure: TRANSVAGINAL TAPE (TVT) PROCEDURE;  Surgeon: Olga Millers, MD;  Location: [facility];  Service: Gynecology;  Laterality: N/A;  . Cystoscopy  11/11/2011    Procedure: CYSTOSCOPY;  Surgeon: Olga Millers, MD;  Location: [facility];  Service: Gynecology;  Laterality: N/A;  . Doppler echocardiography  02/12/2013    LV FUNCTION, SIZE NORMAL; MILD CONCENTRIC LVH; EST EF 55-65%; WALL MOTION NORMAL  . Lexiscan myoview  03/09/2013    EF 50%; NORMAL MYOCARDIAL PERFUSION STUDY - breast attenuation  . Cardiac catheterization  07/2010    LVEF 50-55% WITH VERY MILD GLOBAL HYPOKINESIA; ESSENTIALLY NORMAL CORONARY ARTERIES; NORMAL LV FUNCTION  . Carotids  02/18/11    CAROTID DUPLEX; VERTEBRALS ARE PATENT WITH ANTEGRADE FLOW. ICA/CCA RATIO 1.61 ON RIGHT AND 0.75 ON LEFT  . Right and left cardiac catheterization  04/23/2013    Angiographic normal coronaries; LVEDP 20 mmHg, PCWP 12-14 mmHg, RAP 12 mmHg.; Fick CO/CI 4.9/2.2    Prior to Admission medications   Medication Sig Start Date End Date Taking? Authorizing Provider  albuterol (PROVENTIL HFA;VENTOLIN HFA) 108 (90 BASE) MCG/ACT inhaler Inhale 2 puffs into the lungs every 6 (six) hours as needed. For wheezing.   Yes Historical Provider, MD  albuterol (PROVENTIL) (5 MG/ML) 0.5%  nebulizer solution Take 2.5 mg by nebulization every 6 (six) hours as needed. For wheezing.   Yes Historical Provider, MD  allopurinol (ZYLOPRIM) 100 MG tablet Take 100 mg by mouth at bedtime.    Yes Historical Provider, MD  aspirin EC 81 MG tablet Take 81 mg by mouth daily.    Yes Historical Provider, MD  atorvastatin (LIPITOR) 40 MG tablet Take 40 mg by mouth at bedtime.   Yes Historical Provider, MD  azithromycin (ZITHROMAX) 250 MG tablet 250 mg once. Takes Mon, [place], Friday as maintenance 09/28/13  Yes Historical Provider, MD  B-D ULTRAFINE III SHORT PEN 31G X 8 MM  MISC Follow directions provided by physician 09/13/13  Yes Lisabeth Pick, MD  benzonatate (TESSALON) 200 MG capsule Take 1 capsule (200 mg total) by mouth 3 (three) times daily. 09/19/13  Yes Marijean Heath, NP  buPROPion (WELLBUTRIN XL) 300 MG 24 hr tablet Take 300 mg by mouth daily.   Yes Historical Provider, MD  carvedilol (COREG) 25 MG tablet Take 1 tablet (25 mg total) by mouth 2 (two) times daily with a meal. 10/01/13  Yes Leonie Man, MD  cholecalciferol (VITAMIN D) 1000 UNITS tablet Take 1,000 Units by mouth 2 (two) times daily.   Yes Historical Provider, MD  ciclesonide (OMNARIS) 50 MCG/ACT nasal spray Place 1 spray into both nostrils daily.    Yes Historical Provider, MD  DEXILANT 60 MG capsule Take one capsule by mouth one time daily 08/27/13  Yes Elsie Stain, MD  diltiazem [facility] CD) 120 MG 24 hr capsule Take 1 capsule (120 mg total) by mouth every evening. 06/04/13  Yes Leonie Man, MD  estradiol (ESTRACE) 2 MG tablet Take 2 mg by mouth at bedtime.    Yes Historical Provider, MD  fenofibrate (TRICOR) 145 MG tablet Take one tablet by mouth one time daily 11/04/13  Yes Leonie Man, MD  furosemide (LASIX) 40 MG tablet Take 1 tablet (40 mg total) by mouth 2 (two) times daily. Take additional 40 mg in AM of 7/15 & 7/16 - to shoot for dry wgt of ~2 lb below current weight. 04/23/13  Yes Leonie Man, MD  glucose blood (ONE TOUCH ULTRA TEST) test strip 1 each by Other route 3 (three) times daily. Use as instructed 10/19/13  Yes Bruce Lemmie Evens Swords, MD  glyBURIDE (DIABETA) 2.5 MG tablet TAKE 1 TABLET BY MOUTH DAILY WITH BREAKFAST. 09/28/13  Yes Bruce Kendall Flack, MD  HYDROcodone-homatropine (HYCODAN) 5-1.5 MG/5ML syrup Take 5 mLs by mouth every 6 (six) hours as needed for cough. 11/29/13  Yes Marletta Lor, MD  insulin glargine (LANTUS) 100 UNIT/ML injection Inject 23 Units into the skin at bedtime.   Yes Historical Provider, MD  LORazepam (ATIVAN) 0.5 MG tablet Take 1 tablet  (0.5 mg total) by mouth 2 (two) times daily as needed for anxiety. 11/29/13  Yes Marletta Lor, MD  losartan (COZAAR) 100 MG tablet Take 100 mg by mouth at bedtime. 03/06/13  Yes Leonie Man, MD  metFORMIN (GLUCOPHAGE) 500 MG tablet Take 500 mg by mouth 2 (two) times daily with a meal.   Yes Historical Provider, MD  mometasone-formoterol (DULERA) 200-5 MCG/ACT AERO Inhale 2 puffs into the lungs 2 (two) times daily. 06/15/13  Yes Elsie Stain, MD  omeprazole (PRILOSEC) 20 MG capsule Take 1 capsule (20 mg total) by mouth 2 (two) times daily before a meal. 09/19/13  Yes Marijean Heath, NP  potassium chloride SA (  K-DUR,KLOR-CON) 20 MEQ tablet Take 30 mEq by mouth 2 (two) times daily.   Yes Historical Provider, MD  predniSONE (DELTASONE) 5 MG tablet Take 10 mg by mouth every morning.    Yes Historical Provider, MD  sucralfate (CARAFATE) 1 GM/10ML suspension Take 10 mLs (1 g total) by mouth 4 (four) times daily -  with meals and at bedtime. 11/29/13  Yes April K Palumbo-Rasch, MD  venlafaxine XR (EFFEXOR-XR) 75 MG 24 hr capsule Take 150 mg by mouth at bedtime.   Yes Historical Provider, MD  nitroGLYCERIN (NITROSTAT) 0.4 MG SL tablet Place 0.4 mg under the tongue every 5 (five) minutes as needed. x3 doses as needed for chest pain.    Historical Provider, MD    Allergies  Allergen Reactions  . Lisinopril Cough  . Methotrexate     REACTION: peri-oral and buccal lesions.  . Clindamycin/Lincomycin Nausea And Vomiting and Rash    FAMILY HISTORY:  Family History  Problem Relation Age of Onset  . Diabetes Father   . Heart attack Father   . Coronary artery disease Father   . Heart failure Father   . COPD Mother   . Emphysema Mother   . Asthma Mother   . Heart failure Mother   . Sarcoidosis Maternal Uncle   . Colon cancer Neg Hx   . Lung cancer Brother   . Cancer Brother   . Diabetes Brother   . Heart attack Maternal Grandfather     SOCIAL HISTORY:  reports that she has never  smoked. She has never used smokeless tobacco. She reports that she does not drink alcohol or use illicit drugs.  REVIEW OF SYSTEMS:  A 12-system ROS was conducted and, unless otherwise specified in the HPI, was negative.   PHYSICAL EXAM  VITAL SIGNS: Temp:  [97.6 F (36.4 C)] 97.6 F (36.4 C) (02/22 1854) Pulse Rate:  [90-94] 94 (02/22 2348) Resp:  [22-24] 24 (02/22 2152) BP: (140-157)/(56-84) 140/56 mmHg (02/22 2348) SpO2:  [98 %-100 %] 99 % (02/22 2239)  HEMODYNAMICS:    VENTILATOR SETTINGS:    INTAKE / OUTPUT: Intake/Output   None     PHYSICAL EXAMINATION: General:  Obese F in NAD Neuro:  Intact HEENT:  Sclera anicteric, conjuntiva pink, MMM, OP clear Neck:  Trachea supple and midline, (-) Stridor, Upper airway wheezing noted, (-) JVD Cardiovascular:  RRR, NS1/S2, (-) MRG Lungs:  CTAB at bases, ? Upper airway predominant wheezing Abdomen:  S/NT/Obese/(+) BS Musculoskeletal:  (-) C/C/E Skin:  (-) Rash  LABS:  CBC Recent Labs     12/02/13  2017  WBC  15.0*  HGB  11.5*  HCT  34.4*  PLT  413*    Coag's No results found for this basename: APTT, INR,  in the last 72 hours  BMET Recent Labs     12/02/13  2017  NA  135*  K  3.8  CL  91*  CO2  25  BUN  20  CREATININE  0.70  GLUCOSE  281*    Electrolytes Recent Labs     12/02/13  2017  CALCIUM  9.7    Sepsis Markers No results found for this basename: LACTICACIDVEN, PROCALCITON, O2SATVEN,  in the last 72 hours  ABG Recent Labs     12/02/13  2238  PHART  7.597*  PCO2ART  24.5*  PO2ART  112.0*    Liver Enzymes No results found for this basename: AST, ALT, ALKPHOS, BILITOT, ALBUMIN,  in the last 72 hours  Cardiac Enzymes Recent Labs     12/02/13  2017  PROBNP  63.5    Glucose Recent Labs     12/02/13  2157  GLUCAP  237*    Imaging Dg Chest 2 View  12/02/2013   CLINICAL DATA:  Shortness of breath, wheezing and chest pain.  EXAM: CHEST  2 VIEW  COMPARISON:  Chest radiograph  performed 11/29/2013  FINDINGS: The lungs are hypoexpanded. Vascular crowding is noted. Mild left basilar opacity likely reflects atelectasis. No definite pleural effusion or pneumothorax is seen.  The heart is borderline enlarged. No acute osseous abnormalities are seen. Postoperative change is noted overlying the gastroesophageal junction.  IMPRESSION: Lungs hypoexpanded; mild left basilar airspace opacity likely reflects atelectasis. Borderline cardiomegaly noted.   Electronically Signed   By: Garald Balding M.D.   On: 12/02/2013 21:23   CXR: Personally reviewed. ? L basilar opacity which could be pneumonia vs. Atelectasis.  ASSESSMENT / PLAN: Active Problems:   HYPERTENSION   GERD   Type II diabetes mellitus with neurological manifestations, uncontrolled   Chronic cough   Solitary pulmonary nodule, on CT 02/2013 - recs to repeat imaging in 6-12 months   Chronic diastolic heart failure, NYHA class 2   Asthmatic bronchitis with exacerbation   Sarcoidosis   1. Acute Shortness of Breath: Unclear etiology, there is no stridor nor is there convincing lower lung wheezing. ? Upper airway lesion. Ms. Borden believes that Dr. Joya Gaskins has thought that she has vocal cord dysfunction in the past. It is possible that there is a component of this as well.   Continue prednisone therapy x 5 days  Standing Nebs  Low threshold for antibiotics  Agree with D-Dimer  Consider ENT scope vs. Bronch to evaluate for airway lesion if fails to respond to therapy  I have personally obtained a history, examined the patient, evaluated laboratory and imaging results, formulated the assessment and plan and placed orders.  Margarette Asal, MD Pulmonary and [place] Pager: [phone]  12/03/2013, 12:44 AM

## 2013-12-03 NOTE — Progress Notes (Signed)
No new complaints. No distress  Filed Vitals:   12/03/13 1000 12/03/13 1100 12/03/13 1200 12/03/13 1232  BP:    165/72  Pulse: 100 99 93 95  Temp:   97.9 F (36.6 C)   TempSrc:   Oral   Resp: 16 16    Height:      Weight:      SpO2: 98% 96% 98% 95%   NAD No JVD noted Distant transmitted pseudowheeze No bronchial wheezes notes NABS Ext warm without edema   I have reviewed all of today's lab results. Relevant abnormalities are discussed in the A/P section  CXR: NNF  IMPRESSION: Dyspnea, improved H/O of asthma - no acute bronchospasm noted H/O sarcoidosis (diagnosed by mediastinoscopy in 2008) - probably unrelated to acute presentation. Chronic Rx with pred 10 mg daily H/O VCD - Suspect significant contributor to acute presentation  Overall seems much improved and does not appear to require SDU level of care   PLAN: Transfer to med-surg Change steroids to PO and taper back to her baseline of 10 mg/d over next 5 or 7 days There is mention of a CT chest pending but I see no order for it. This is not needed and should not be re-ordered, in my opinion She may be discharged to home in the next day or two if deemed OK by primary team She should follow up with [facility] in 2-3 wks after discharge  PCCM will sign off. Please call if we can be of further assistance  Merton Border, MD ; [facility] [phone].  After 5:30 PM or weekends, call [phone]

## 2013-12-03 NOTE — H&P (Signed)
[facility] History and Physical  [name] ZOX:[id number] DOB: Sep 21, 1961 DOA: 12/02/2013  Referring physician: EDP PCP: Chancy Hurter, MD   Chief Complaint: Worsening shortness of breath  HPI: [name] is a 53 y.o. female this past medical history significant for pulmonary sarcoidosis, asthmatic bronchitis followed at [facility] and also by Dr. Joya Gaskins, diabetes mellitus, hypertension, GERD, vocal cord dysfunction who presents with complaints of worsening shortness of breath for over one week. She also admits to a nonproductive cough. She saw her PCP and was started on antitussives but with no improvement. She was seen in [facility] ED about 3 days ago>> improved with our treatment to the ED and so was discharged home. She reports that the nonproductive cough has persisted and her shortness of breath worsened today and so she came to the ED. She admits to some pleuritic pain with coughing. In the ED a chest x-ray was done that showed hypoexpanded lung with mild left basilar airspace opacity likely reflecting atelectasis, her BNP was 63.5, troponin 0. An ABG was done and shows a pH of 7.59 with a PCO2 of 24.5 PO2 of 112 O2 sat of 98.8. She was given nebs, prednisone, and Ativan in the ED with no improvement. She is admitted for further evaluation and management. She denies leg swelling, fevers, dysuria.   Review of Systems The patient denies anorexia, fever, weight loss,, vision loss, decreased hearing, syncope, dyspnea on exertion, peripheral edema, balance deficits, hemoptysis, abdominal pain, melena, hematochezia, severe indigestion/heartburn, hematuria, incontinence, genital sores, muscle weakness, suspicious skin lesions, transient blindness, difficulty walking, depression,    Past Medical History  Diagnosis Date  . DIABETES MELLITUS, TYPE II 08/21/2006  . HYPERLIPIDEMIA 08/21/2006  . GOUT 08/20/2010  . OBESITY 06/04/2009  . ANEMIA-UNSPECIFIED 09/18/2009  . HYPERTENSION  08/21/2006  . GERD 08/21/2006  . SLEEP APNEA 04/21/2009    last testing was negative  . Internal hemorrhoids   . Pulmonary sarcoidosis     Followed locally by pulmonology, but also by Dr. Casper Harrison at [facility]  . Exertional chest pain     sharp, substernal, exertional  . Vocal cord dysfunction   . Chronic diastolic heart failure, NYHA class 2     LVEDP roughly 20% by cath  . CHF (congestive heart failure)    Past Surgical History  Procedure Laterality Date  . Ventral hernia repair    . Nissen fundoplication  8119  . Cholecystectomy  1984  . Abdominal hysterectomy    . Knee arthroscopy      right  . Tubal ligation      with reversal in 1994  . Bladder suspension  11/11/2011    Procedure: TRANSVAGINAL TAPE (TVT) PROCEDURE;  Surgeon: Olga Millers, MD;  Location: [facility];  Service: Gynecology;  Laterality: N/A;  . Cystoscopy  11/11/2011    Procedure: CYSTOSCOPY;  Surgeon: Olga Millers, MD;  Location: [facility];  Service: Gynecology;  Laterality: N/A;  . Doppler echocardiography  02/12/2013    LV FUNCTION, SIZE NORMAL; MILD CONCENTRIC LVH; EST EF 55-65%; WALL MOTION NORMAL  . Lexiscan myoview  03/09/2013    EF 50%; NORMAL MYOCARDIAL PERFUSION STUDY - breast attenuation  . Cardiac catheterization  07/2010    LVEF 50-55% WITH VERY MILD GLOBAL HYPOKINESIA; ESSENTIALLY NORMAL CORONARY ARTERIES; NORMAL LV FUNCTION  . Carotids  02/18/11    CAROTID DUPLEX; VERTEBRALS ARE PATENT WITH ANTEGRADE FLOW. ICA/CCA RATIO 1.61 ON RIGHT AND 0.75 ON LEFT  . Right and left cardiac catheterization  04/23/2013    Angiographic normal coronaries; LVEDP 20 mmHg, PCWP 12-14 mmHg, RAP 12 mmHg.; Fick CO/CI 4.9/2.2   Social History:  reports that she has never smoked. She has never used smokeless tobacco. She reports that she does not drink alcohol or use illicit drugs.  Allergies  Allergen Reactions  . Lisinopril Cough  . Methotrexate     REACTION: peri-oral and buccal lesions.  .  Clindamycin/Lincomycin Nausea And Vomiting and Rash    Family History  Problem Relation Age of Onset  . Diabetes Father   . Heart attack Father   . Coronary artery disease Father   . Heart failure Father   . COPD Mother   . Emphysema Mother   . Asthma Mother   . Heart failure Mother   . Sarcoidosis Maternal Uncle   . Colon cancer Neg Hx   . Lung cancer Brother   . Cancer Brother   . Diabetes Brother   . Heart attack Maternal Grandfather      Prior to Admission medications   Medication Sig Start Date End Date Taking? Authorizing Provider  albuterol (PROVENTIL HFA;VENTOLIN HFA) 108 (90 BASE) MCG/ACT inhaler Inhale 2 puffs into the lungs every 6 (six) hours as needed. For wheezing.   Yes Historical Provider, MD  albuterol (PROVENTIL) (5 MG/ML) 0.5% nebulizer solution Take 2.5 mg by nebulization every 6 (six) hours as needed. For wheezing.   Yes Historical Provider, MD  allopurinol (ZYLOPRIM) 100 MG tablet Take 100 mg by mouth at bedtime.    Yes Historical Provider, MD  aspirin EC 81 MG tablet Take 81 mg by mouth daily.    Yes Historical Provider, MD  atorvastatin (LIPITOR) 40 MG tablet Take 40 mg by mouth at bedtime.   Yes Historical Provider, MD  azithromycin (ZITHROMAX) 250 MG tablet 250 mg once. Takes Mon, [place], Friday as maintenance 09/28/13  Yes Historical Provider, MD  B-D ULTRAFINE III SHORT PEN 31G X 8 MM MISC Follow directions provided by physician 09/13/13  Yes Lisabeth Pick, MD  benzonatate (TESSALON) 200 MG capsule Take 1 capsule (200 mg total) by mouth 3 (three) times daily. 09/19/13  Yes Marijean Heath, NP  buPROPion (WELLBUTRIN XL) 300 MG 24 hr tablet Take 300 mg by mouth daily.   Yes Historical Provider, MD  carvedilol (COREG) 25 MG tablet Take 1 tablet (25 mg total) by mouth 2 (two) times daily with a meal. 10/01/13  Yes Leonie Man, MD  cholecalciferol (VITAMIN D) 1000 UNITS tablet Take 1,000 Units by mouth 2 (two) times daily.   Yes Historical Provider, MD   ciclesonide (OMNARIS) 50 MCG/ACT nasal spray Place 1 spray into both nostrils daily.    Yes Historical Provider, MD  DEXILANT 60 MG capsule Take one capsule by mouth one time daily 08/27/13  Yes Elsie Stain, MD  diltiazem [employer] CD) 120 MG 24 hr capsule Take 1 capsule (120 mg total) by mouth every evening. 06/04/13  Yes Leonie Man, MD  estradiol (ESTRACE) 2 MG tablet Take 2 mg by mouth at bedtime.    Yes Historical Provider, MD  fenofibrate (TRICOR) 145 MG tablet Take one tablet by mouth one time daily 11/04/13  Yes Leonie Man, MD  furosemide (LASIX) 40 MG tablet Take 1 tablet (40 mg total) by mouth 2 (two) times daily. Take additional 40 mg in AM of 7/15 & 7/16 - to shoot for dry wgt of ~2 lb below current weight. 04/23/13  Yes Leonie Man, MD  glucose blood (ONE TOUCH ULTRA TEST) test strip 1 each by Other route 3 (three) times daily. Use as instructed 10/19/13  Yes Bruce Lemmie Evens Swords, MD  glyBURIDE (DIABETA) 2.5 MG tablet TAKE 1 TABLET BY MOUTH DAILY WITH BREAKFAST. 09/28/13  Yes Bruce Kendall Flack, MD  HYDROcodone-homatropine (HYCODAN) 5-1.5 MG/5ML syrup Take 5 mLs by mouth every 6 (six) hours as needed for cough. 11/29/13  Yes Marletta Lor, MD  insulin glargine (LANTUS) 100 UNIT/ML injection Inject 23 Units into the skin at bedtime.   Yes Historical Provider, MD  LORazepam (ATIVAN) 0.5 MG tablet Take 1 tablet (0.5 mg total) by mouth 2 (two) times daily as needed for anxiety. 11/29/13  Yes Marletta Lor, MD  losartan (COZAAR) 100 MG tablet Take 100 mg by mouth at bedtime. 03/06/13  Yes Leonie Man, MD  metFORMIN (GLUCOPHAGE) 500 MG tablet Take 500 mg by mouth 2 (two) times daily with a meal.   Yes Historical Provider, MD  mometasone-formoterol (DULERA) 200-5 MCG/ACT AERO Inhale 2 puffs into the lungs 2 (two) times daily. 06/15/13  Yes Elsie Stain, MD  omeprazole (PRILOSEC) 20 MG capsule Take 1 capsule (20 mg total) by mouth 2 (two) times daily before a meal. 09/19/13   Yes Marijean Heath, NP  potassium chloride SA (K-DUR,KLOR-CON) 20 MEQ tablet Take 30 mEq by mouth 2 (two) times daily.   Yes Historical Provider, MD  predniSONE (DELTASONE) 5 MG tablet Take 10 mg by mouth every morning.    Yes Historical Provider, MD  sucralfate (CARAFATE) 1 GM/10ML suspension Take 10 mLs (1 g total) by mouth 4 (four) times daily -  with meals and at bedtime. 11/29/13  Yes April K Palumbo-Rasch, MD  venlafaxine XR (EFFEXOR-XR) 75 MG 24 hr capsule Take 150 mg by mouth at bedtime.   Yes Historical Provider, MD  nitroGLYCERIN (NITROSTAT) 0.4 MG SL tablet Place 0.4 mg under the tongue every 5 (five) minutes as needed. x3 doses as needed for chest pain.    Historical Provider, MD   Physical Exam: Filed Vitals:   12/02/13 2348  BP: 140/56  Pulse: 94  Temp:   Resp:     BP 140/56  Pulse 94  Temp(Src) 97.6 F (36.4 C) (Oral)  Resp 24  SpO2 99% Constitutional: Vital signs reviewed.  Patient is a well-developed,  Obese, moderate respiratory distress cooperative with exam. Alert and oriented x3.  Head: Normocephalic and atraumatic Mouth: no erythema or exudates, MMM Eyes: PERRL, EOMI, conjunctivae normal, No scleral icterus.  Neck: Supple, Trachea midline normal ROM, No JVD, mass, thyromegaly, or carotid bruit present.  Cardiovascular: RRR, S1 normal, S2 normal, no MRG, pulses symmetric and intact bilaterally Pulmonary/Chest:  accessory muscle use,  upper airway wheezes, with clear lung fields CTAB Abdominal: Soft. Non-tender, non-distended, bowel sounds are normal, no masses, organomegaly, or guarding present.  GU: no CVA tenderness  extremities: No cyanosis and no edema   Neurological: A&O x3, Strength is normal and symmetric bilaterally, cranial nerve II-XII are grossly intact, no focal motor deficit, sensory intact to light touch bilaterally.  Skin: Warm, dry and intact. No rash, cyanosis, or clubbing.  Psychiatric: Normal mood and affect. speech and behavior is  normal.               Labs on Admission:  Basic Metabolic Panel:  Recent Labs Lab 11/29/13 0150 12/02/13 2017  NA 137 135*  K 4.0 3.8  CL 95* 91*  CO2  --  25  GLUCOSE  291* 281*  BUN 18 20  CREATININE 0.80 0.70  CALCIUM  --  9.7   Liver Function Tests: No results found for this basename: AST, ALT, ALKPHOS, BILITOT, PROT, ALBUMIN,  in the last 168 hours No results found for this basename: LIPASE, AMYLASE,  in the last 168 hours No results found for this basename: AMMONIA,  in the last 168 hours CBC:  Recent Labs Lab 11/29/13 0144 11/29/13 0150 12/02/13 2017  WBC 14.0*  --  15.0*  NEUTROABS 9.0*  --  10.5*  HGB 11.5* 12.2 11.5*  HCT 34.7* 36.0 34.4*  MCV 89.2  --  88.4  PLT 379  --  413*   Cardiac Enzymes: No results found for this basename: CKTOTAL, CKMB, CKMBINDEX, TROPONINI,  in the last 168 hours  BNP (last 3 results)  Recent Labs  04/11/13 2117 09/18/13 0019 12/02/13 2017  PROBNP 193.5* 93.8 63.5   CBG:  Recent Labs Lab 12/02/13 2157  GLUCAP 237*    Radiological Exams on Admission: Dg Chest 2 View  12/02/2013   CLINICAL DATA:  Shortness of breath, wheezing and chest pain.  EXAM: CHEST  2 VIEW  COMPARISON:  Chest radiograph performed 11/29/2013  FINDINGS: The lungs are hypoexpanded. Vascular crowding is noted. Mild left basilar opacity likely reflects atelectasis. No definite pleural effusion or pneumothorax is seen.  The heart is borderline enlarged. No acute osseous abnormalities are seen. Postoperative change is noted overlying the gastroesophageal junction.  IMPRESSION: Lungs hypoexpanded; mild left basilar airspace opacity likely reflects atelectasis. Borderline cardiomegaly noted.   Electronically Signed   By: Garald Balding M.D.   On: 12/02/2013 21:23    EKG: Independently reviewed. Normal sinus rhythm at 90 with no acute ischemic changes.  Assessment/Plan Active Problems:   Asthmatic bronchitis with exacerbation/respiratory  distress -As discussed above, will place on bronchodilators, Solu-Medrol and empiric antibiotics  -ABG consistent with hyperventilation as above Have discussed patient with Dr. Deterding-pulmonologist and on-call fellow to see patient   Sarcoidosis -Has history of pulmonary sarcoidosis and is followed at [facility] and also by the right as discussed above -Will obtain ESR and CRP, pulmonology to see as above for further recommendations -She is on prednisone 10 mg daily at home, have placed her on Solu-Medrol as above>> follow    HYPERTENSION -Continue outpatient medications   GERD -Continue PPI and outpatient medications   Type II diabetes mellitus with neurological manifestations, uncontrolled -Continue her outpatient medications -Monitor Accu-Cheks as well and cover with SSI   Solitary pulmonary nodule, on CT 02/2013  -She is followed by Dr. Joya Gaskins   Chronic diastolic heart failure, NYHA class 2 -Stable, her BNP 63 -Continue Lasix History of vocal cord dysfunction     Code Status: Full  Family Communication: family at Bedside Disposition Plan: Admit to step down  Time spent: >2mns  [facility] Pager [phone]

## 2013-12-04 ENCOUNTER — Encounter (HOSPITAL_COMMUNITY): Admission: RE | Admit: 2013-12-04 | Payer: BC Managed Care – PPO | Source: Ambulatory Visit

## 2013-12-04 DIAGNOSIS — I5032 Chronic diastolic (congestive) heart failure: Secondary | ICD-10-CM | POA: Diagnosis not present

## 2013-12-04 DIAGNOSIS — R911 Solitary pulmonary nodule: Secondary | ICD-10-CM

## 2013-12-04 DIAGNOSIS — J441 Chronic obstructive pulmonary disease with (acute) exacerbation: Secondary | ICD-10-CM

## 2013-12-04 DIAGNOSIS — J99 Respiratory disorders in diseases classified elsewhere: Secondary | ICD-10-CM

## 2013-12-04 DIAGNOSIS — J449 Chronic obstructive pulmonary disease, unspecified: Secondary | ICD-10-CM | POA: Diagnosis not present

## 2013-12-04 DIAGNOSIS — D869 Sarcoidosis, unspecified: Secondary | ICD-10-CM | POA: Diagnosis not present

## 2013-12-04 LAB — GLUCOSE, CAPILLARY
Glucose-Capillary: 214 mg/dL — ABNORMAL HIGH (ref 70–99)
Glucose-Capillary: 299 mg/dL — ABNORMAL HIGH (ref 70–99)
Glucose-Capillary: 354 mg/dL — ABNORMAL HIGH (ref 70–99)

## 2013-12-04 LAB — BASIC METABOLIC PANEL
BUN: 19 mg/dL (ref 6–23)
CO2: 25 mEq/L (ref 19–32)
Calcium: 9.2 mg/dL (ref 8.4–10.5)
Chloride: 93 mEq/L — ABNORMAL LOW (ref 96–112)
Creatinine, Ser: 0.68 mg/dL (ref 0.50–1.10)
GFR calc Af Amer: >90 mL/min (ref >90)
GFR calc non Af Amer: >90 mL/min (ref >90)
Glucose, Bld: 343 mg/dL — ABNORMAL HIGH (ref 70–99)
Potassium: 4 mEq/L (ref 3.7–5.3)
Sodium: 134 mEq/L — ABNORMAL LOW (ref 137–147)

## 2013-12-04 LAB — CBC
HCT: 33 % — ABNORMAL LOW (ref 36.0–46.0)
Hemoglobin: 10.9 g/dL — ABNORMAL LOW (ref 12.0–15.0)
MCH: 29.4 pg (ref 26.0–34.0)
MCHC: 33 g/dL (ref 30.0–36.0)
MCV: 88.9 fL (ref 78.0–100.0)
Platelets: 401 10*3/uL — ABNORMAL HIGH (ref 150–400)
RBC: 3.71 MIL/uL — ABNORMAL LOW (ref 3.87–5.11)
RDW: 13.4 % (ref 11.5–15.5)
WBC: 15.9 10*3/uL — ABNORMAL HIGH (ref 4.0–10.5)

## 2013-12-04 LAB — EXPECTORATED SPUTUM ASSESSMENT W GRAM STAIN, RFLX TO RESP C

## 2013-12-04 LAB — EXPECTORATED SPUTUM ASSESSMENT W REFEX TO RESP CULTURE

## 2013-12-04 MED ORDER — INSULIN ASPART 100 UNIT/ML ~~LOC~~ SOLN
0.0000 [IU] | Freq: Every day | SUBCUTANEOUS | Status: DC
  Administered 2013-12-04: 3 [IU] via SUBCUTANEOUS

## 2013-12-04 MED ORDER — INSULIN GLARGINE 100 UNIT/ML ~~LOC~~ SOLN
30.0000 [IU] | Freq: Every day | SUBCUTANEOUS | Status: DC
  Administered 2013-12-04: 30 [IU] via SUBCUTANEOUS
  Filled 2013-12-04 (×2): qty 0.3

## 2013-12-04 MED ORDER — INSULIN ASPART 100 UNIT/ML ~~LOC~~ SOLN
0.0000 [IU] | Freq: Three times a day (TID) | SUBCUTANEOUS | Status: DC
  Administered 2013-12-05: 7 [IU] via SUBCUTANEOUS
  Administered 2013-12-05: 3 [IU] via SUBCUTANEOUS

## 2013-12-04 NOTE — Progress Notes (Signed)
[facility] PROGRESS NOTE  [name] ZOX:[id number] DOB: [birth year] DOA: 12/02/2013 PCP: Chancy Hurter, MD  Assessment/Plan: 1. Acute respiratory failure evidence by respiratory distress, labored breathing, having a respiratory rate of 28 initially. Patient improved after the administration of steroids and nebs. She remains on empiric antibiotic therapy with Levaquin. She was seen and evaluated by pulmonary critical care medicine, stable for transfer to [place]. 2. Chronic diastolic congestive heart failure, status post transthoracic echocardiogram performed on 02/12/2013 showed an ejection fraction of 50-55%. Continue Lasix 40 mg by mouth twice a day. 3. Type 2 diabetes mellitus. Blood sugars elevated in the 300 range, I suspect steroids contributing to hyperglycemia. Will increase Lantus to 30 units subcutaneous each bedtime, continue sliding scale coverage with Accu-Cheks q. a.c. and each bedtime. 4. Leukocytosis. Patient on white count of 15,900, may be related in part to the administration of steroids. Overall though showing clinical improvement.  5. Solitary pulmonary nodule seen on CT scan in May of 2014. Will require monitoring. She was seen and evaluated by pulmonary critical care medicine. 6. History of sarcoidosis. Status post mediastinal Scott T. performed in 2008. She is on chronic steroids with prednisone 10 mg daily.  Code Status: Full code  Family Communication: Family members not present at bedside Disposition Plan: Transfer to [place] floor on 12/04/2013   Consultants:  Pulmonary critical care medicine  Antibiotics:  Levaquin 500 mg by mouth daily  HPI/Subjective: Patient is a pleasant 53 year old female with a past medical history of pulmonary sarcoid, asthma, type 2 diabetes mellitus, hypertension who was admitted to the medicine service on 12/03/2013. She presented with complaints of shortness of breath which has progressively worsened over the 2  weeks prior to hospitalization. Patient was found to be in acute respiratory failure, evidence by labored breathing, having a respiratory of 28. Initial chest x-ray on admission showed hypoexpanded lungs with mild left basilar airspace opacity that could reflect atelectasis. Pulmonary critical care medicine was consulted as she was admitted to the intensive care unit for close monitoring. She was started on prednisone and nebulizer treatments. Patient showing clinical improvement and by 12/04/2013 was transferred out of the step down unit.  Objective: Filed Vitals:   12/04/13 1417  BP: 144/52  Pulse: 85  Temp: 98 F (36.7 C)  Resp: 18    Intake/Output Summary (Last 24 hours) at 12/04/13 1424 Last data filed at 12/04/13 1000  Gross per 24 hour  Intake     10 ml  Output   2550 ml  Net  -2540 ml   Filed Weights   12/03/13 0242 12/03/13 0827 12/04/13 0400  Weight: 117.2 kg (258 lb 6.1 oz) 117.2 kg (258 lb 6.1 oz) 119.7 kg (263 lb 14.3 oz)    Exam: General: [name] alert, follows commands appropriately, not in acute distress  Cardiovascular: Regular rate and rhythm, S1/S2, no murmurs, no rubs, no gallops  Respiratory: Clear to auscultation bilaterally, mild end expiratory wheezing at bases  Abdomen: Soft, non tender, non distended, bowel sounds present, no guarding  Extremities: No edema, pulses DP and PT palpable bilaterally  Neuro: Grossly nonfocal   Data Reviewed: Basic Metabolic Panel:  Recent Labs Lab 11/29/13 0150 12/02/13 2017 12/04/13 0320  NA 137 135* 134*  K 4.0 3.8 4.0  CL 95* 91* 93*  CO2  --  25 25  GLUCOSE 291* 281* 343*  BUN 18 20 19   CREATININE 0.80 0.70 0.68  CALCIUM  --  9.7 9.2   Liver Function Tests:  No results found for this basename: AST, ALT, ALKPHOS, BILITOT, PROT, ALBUMIN,  in the last 168 hours No results found for this basename: LIPASE, AMYLASE,  in the last 168 hours No results found for this basename: AMMONIA,  in the last 168  hours CBC:  Recent Labs Lab 11/29/13 0144 11/29/13 0150 12/02/13 2017 12/04/13 0320  WBC 14.0*  --  15.0* 15.9*  NEUTROABS 9.0*  --  10.5*  --   HGB 11.5* 12.2 11.5* 10.9*  HCT 34.7* 36.0 34.4* 33.0*  MCV 89.2  --  88.4 88.9  PLT 379  --  413* 401*   Cardiac Enzymes:  Recent Labs Lab 12/03/13 0126 12/03/13 0649  TROPONINI <0.30 <0.30   BNP (last 3 results)  Recent Labs  04/11/13 2117 09/18/13 0019 12/02/13 2017  PROBNP 193.5* 93.8 63.5   CBG:  Recent Labs Lab 12/03/13 1141 12/03/13 1700 12/03/13 2114 12/04/13 0730 12/04/13 1204  GLUCAP 319* 332* 342* 214* 299*    Recent Results (from the past 240 hour(s))  MRSA PCR SCREENING     Status: None   Collection Time    12/03/13  1:52 AM      Result Value Ref Range Status   MRSA by PCR NEGATIVE  NEGATIVE Final   Comment:            The GeneXpert MRSA Assay (FDA     approved for NASAL specimens     only), is one component of a     comprehensive MRSA colonization     surveillance program. It is not     intended to diagnose MRSA     infection nor to guide or     monitor treatment for     MRSA infections.  CULTURE, EXPECTORATED SPUTUM-ASSESSMENT     Status: None   Collection Time    12/03/13  2:35 PM      Result Value Ref Range Status   Specimen Description SPUTUM   Final   Special Requests NONE   Final   Sputum evaluation     Final   Value: MICROSCOPIC FINDINGS SUGGEST THAT THIS SPECIMEN IS NOT REPRESENTATIVE OF LOWER RESPIRATORY SECRETIONS. PLEASE RECOLLECT.     Gram Stain Report Called to,Read Back By and Verified With: A.LAWSON AT 1500 ON 2.23.25 BY SHEAW   Report Status 12/03/2013 FINAL   Final  CULTURE, RESPIRATORY (NON-EXPECTORATED)     Status: None   Collection Time    12/03/13  9:41 PM      Result Value Ref Range Status   Specimen Description SPUTUM   Final   Special Requests NONE   Final   Gram Stain     Final   Value: FEW WBC PRESENT, PREDOMINANTLY PMN     FEW SQUAMOUS EPITHELIAL CELLS  PRESENT     FEW GRAM POSITIVE COCCI     IN PAIRS IN CHAINS     Performed at [facility]   Culture     Final   Value: NO GROWTH 1 DAY     Performed at [facility]   Report Status PENDING   Incomplete  CULTURE, EXPECTORATED SPUTUM-ASSESSMENT     Status: None   Collection Time    12/03/13 10:22 PM      Result Value Ref Range Status   Specimen Description SPUTUM   Final   Special Requests NONE   Final   Sputum evaluation     Final   Value: THIS SPECIMEN IS ACCEPTABLE. RESPIRATORY CULTURE REPORT TO  FOLLOW.   Report Status 12/04/2013 FINAL   Final     Studies: Dg Chest 2 View  12/02/2013   CLINICAL DATA:  Shortness of breath, wheezing and chest pain.  EXAM: CHEST  2 VIEW  COMPARISON:  Chest radiograph performed 11/29/2013  FINDINGS: The lungs are hypoexpanded. Vascular crowding is noted. Mild left basilar opacity likely reflects atelectasis. No definite pleural effusion or pneumothorax is seen.  The heart is borderline enlarged. No acute osseous abnormalities are seen. Postoperative change is noted overlying the gastroesophageal junction.  IMPRESSION: Lungs hypoexpanded; mild left basilar airspace opacity likely reflects atelectasis. Borderline cardiomegaly noted.   Electronically Signed   By: Garald Balding M.D.   On: 12/02/2013 21:23    Scheduled Meds: . allopurinol  100 mg Oral QHS  . antiseptic oral rinse  15 mL Mouth Rinse BID  . aspirin EC  81 mg Oral Daily  . atorvastatin  40 mg Oral QHS  . benzonatate  200 mg Oral TID  . buPROPion  300 mg Oral Daily  . carvedilol  25 mg Oral BID WC  . cholecalciferol  1,000 Units Oral BID  . dextromethorphan-guaiFENesin  1 tablet Oral BID  . diltiazem  120 mg Oral QPM  . enoxaparin (LOVENOX) injection  40 mg Subcutaneous Q24H  . estradiol  2 mg Oral QHS  . fenofibrate  160 mg Oral Daily  . fluticasone  1 spray Each Nare Daily  . furosemide  40 mg Oral BID  . insulin aspart  0-15 Units Subcutaneous TID WC  . insulin aspart   0-5 Units Subcutaneous QHS  . insulin glargine  30 Units Subcutaneous QHS  . ipratropium-albuterol  3 mL Nebulization Q6H  . levofloxacin  500 mg Oral Daily  . losartan  100 mg Oral QHS  . metFORMIN  500 mg Oral BID WC  . mometasone-formoterol  2 puff Inhalation BID  . pantoprazole  40 mg Oral Daily  . potassium chloride SA  30 mEq Oral BID  . [START ON 12/05/2013] predniSONE  30 mg Oral Once   Followed by  . [START ON 12/06/2013] predniSONE  20 mg Oral Once   Followed by  . [START ON 12/07/2013] predniSONE  10 mg Oral Once  . sodium chloride  3 mL Intravenous Q12H  . sodium chloride  3 mL Intravenous Q12H  . sucralfate  1 g Oral TID WC & HS  . venlafaxine XR  150 mg Oral QHS   Continuous Infusions:   Active Problems:   HYPERTENSION   GERD   Type II diabetes mellitus with neurological manifestations, uncontrolled   Chronic cough   Solitary pulmonary nodule, on CT 02/2013 - recs to repeat imaging in 6-12 months   Chronic diastolic heart failure, NYHA class 2   Asthmatic bronchitis with exacerbation   Sarcoidosis   Vocal cord dysfunction    Time spent: 35 min    Kelvin Cellar  [facility] Pager [phone]. If 7PM-7AM, please contact night-coverage at [email or web address], password [facility] 12/04/2013, 2:24 PM  LOS: 2 days

## 2013-12-04 NOTE — Progress Notes (Signed)
Inpatient Diabetes Program Recommendations  AACE/ADA: New Consensus Statement on Inpatient Glycemic Control (2013)  Target Ranges:  Prepandial:   less than 140 mg/dL      Peak postprandial:   less than 180 mg/dL (1-2 hours)      Critically ill patients:  140 - 180 mg/dL   Reason for Visit: Hyperglycemia  Diabetes history: Type 2 Current orders for Inpatient glycemic control: Lantus 23 units QHS, Novolog moderate tidwc and hs, metformin 500 mg bid  Inpatient Diabetes Program Recommendations Insulin - Basal: Increase Lantus to 30 units QHS Insulin - Meal Coverage: Add meal coverage insulin while on steroids - Novolog 4 units tidwc if po intake >50% HgbA1C: Need updated HgbA1C to assess glycemic control prior to hospitalization  Thank you. [name], RD, LDN, CDE Inpatient Diabetes Coordinator [phone]

## 2013-12-05 DIAGNOSIS — R05 Cough: Secondary | ICD-10-CM | POA: Diagnosis not present

## 2013-12-05 DIAGNOSIS — J9600 Acute respiratory failure, unspecified whether with hypoxia or hypercapnia: Principal | ICD-10-CM

## 2013-12-05 DIAGNOSIS — J9601 Acute respiratory failure with hypoxia: Secondary | ICD-10-CM | POA: Diagnosis present

## 2013-12-05 DIAGNOSIS — J45901 Unspecified asthma with (acute) exacerbation: Secondary | ICD-10-CM | POA: Diagnosis not present

## 2013-12-05 DIAGNOSIS — J449 Chronic obstructive pulmonary disease, unspecified: Secondary | ICD-10-CM | POA: Diagnosis not present

## 2013-12-05 DIAGNOSIS — R059 Cough, unspecified: Secondary | ICD-10-CM | POA: Diagnosis not present

## 2013-12-05 LAB — RESPIRATORY VIRUS PANEL
Adenovirus: NOT DETECTED
Influenza A H1: NOT DETECTED
Influenza A H3: NOT DETECTED
Influenza A: NOT DETECTED
Influenza B: NOT DETECTED
Metapneumovirus: NOT DETECTED
Parainfluenza 1: NOT DETECTED
Parainfluenza 2: NOT DETECTED
Parainfluenza 3: NOT DETECTED
Respiratory Syncytial Virus A: NOT DETECTED
Respiratory Syncytial Virus B: NOT DETECTED
Rhinovirus: DETECTED — AB

## 2013-12-05 LAB — GLUCOSE, CAPILLARY
Glucose-Capillary: 289 mg/dL — ABNORMAL HIGH (ref 70–99)
Glucose-Capillary: 122 mg/dL — ABNORMAL HIGH (ref 70–99)
Glucose-Capillary: 245 mg/dL — ABNORMAL HIGH (ref 70–99)

## 2013-12-05 MED ORDER — DM-GUAIFENESIN ER 30-600 MG PO TB12: 1.0000 | ORAL_TABLET | Freq: Two times a day (BID) | ORAL | Status: DC

## 2013-12-05 MED ORDER — HYDROCOD POLST-CHLORPHEN POLST 10-8 MG/5ML PO LQCR: 5.0000 mL | Freq: Two times a day (BID) | ORAL | Status: DC

## 2013-12-05 MED ORDER — LEVOFLOXACIN 500 MG PO TABS: 500.0000 mg | ORAL_TABLET | Freq: Every day | ORAL | Status: DC

## 2013-12-05 MED ORDER — PHENOL 1.4 % MT LIQD
1.0000 | OROMUCOSAL | Status: DC | PRN
  Filled 2013-12-05: qty 177

## 2013-12-05 MED ORDER — HYDROCOD POLST-CHLORPHEN POLST 10-8 MG/5ML PO LQCR
5.0000 mL | Freq: Two times a day (BID) | ORAL | Status: DC
  Administered 2013-12-05: 5 mL via ORAL
  Filled 2013-12-05: qty 5

## 2013-12-05 MED ORDER — MENTHOL 3 MG MT LOZG
1.0000 | LOZENGE | OROMUCOSAL | Status: DC | PRN
  Filled 2013-12-05: qty 9

## 2013-12-05 NOTE — Discharge Summary (Signed)
Physician Discharge Summary  ELFREDA MALBROUGH [id number] DOB: 1961-05-14 DOA: 12/02/2013  PCP: Chancy Hurter, MD  Admit date: 12/02/2013 Discharge date: 12/05/2013  Recommendations for Outpatient Follow-up:  1. Recommend outpatient referral to ENT for further evaluation of vocal cord dysfunction. 2. Re-image chest by May 2015 to assess stability of pulmonary nodule.  Discharge Diagnoses:  Principal Problem:    Acute respiratory failure Active Problems:    HYPERTENSION    GERD    Type II diabetes mellitus with neurological manifestations, uncontrolled    Chronic cough    Solitary pulmonary nodule, on CT 02/2013 - recs to repeat imaging in 6-12 months    Chronic diastolic heart failure, NYHA class 2    Asthmatic bronchitis with exacerbation    Sarcoidosis    Vocal cord dysfunction   Discharge Condition: Improved.  Diet recommendation: Low-sodium, heart healthy, carbohydrate modified.  History of present illness:  53 year old woman with a PMH of sarcoidosis, asthmatic bronchitis, type 2 diabetes, hypertension, and vocal cord dysfunction who was admitted on 12/03/13 with worsening shortness of breath. ABG done on presentation was consistent with hyperventilation.  Hospital Course by problem:  Principal Problem:   Acute respiratory failure secondary to asthmatic bronchitis with exacerbation and vocal cord dysfunction Patient was found to be in hyperventilation on initial presentation with respiratory rate of 28. She improved after being given steroids and nebulized bronchodilator therapy. She was also placed on empiric antibiotic therapy with Levaquin. She was seen and evaluated by the pulmonary critical care team while in hospital with recommendations to taper her steroids back to her baseline dose. She should followup with the pulmonology team and 2-3 weeks. Patient has also been referred to pulmonary rehabilitation. Active Problems:   HYPERTENSION Patient was  treated with her usual antihypertensives including Cardizem, Lasix, Cozaar and Coreg.   GERD The patient was continued on PPI therapy.   Type II diabetes mellitus with neurological manifestations, uncontrolled The patient was treated with a combination of her home medications as well as sliding scale insulin and Lantus increased to 30 units while in the hospital due to an increase in her steroid dose.   Chronic cough Patient was put on multiple anti-tussives.  She can continue Tussionex, Robitussin-DM, and Tessalon Perles as needed.   Solitary pulmonary nodule, on CT 02/2013 - recs to repeat imaging in 6-12 months Recommend repeat imaging by May 2015.   Chronic diastolic heart failure, NYHA class 2 Appears to be well compensated clinically, on Lasix twice a day.   Sarcoidosis Continue 10 mg of prednisone daily when finished with her steroid wean.  Procedures:  None  Consultations:  Dr. Merton Border, Pulmonology  Discharge Exam: Filed Vitals:   12/05/13 0557  BP: 155/72  Pulse: 82  Temp: 97.2 F (36.2 C)  Resp: 18   Filed Vitals:   12/04/13 2158 12/05/13 0230 12/05/13 0557 12/05/13 0755  BP: 147/89  155/72   Pulse: 74  82   Temp: 96.7 F (35.9 C)  97.2 F (36.2 C)   TempSrc: Oral  Oral   Resp: 18  18   Height:      Weight:   116.7 kg (257 lb 4.4 oz)   SpO2: 99% 98% 98% 99%    Gen:  NAD Cardiovascular:  RRR, No M/R/G Respiratory: Lungs with a few high pitched wheezes Gastrointestinal: Abdomen soft, NT/ND with normal active bowel sounds. Extremities: No C/E/C   Discharge Instructions      Discharge Orders   Future Appointments Provider  Department Dept Phone   12/06/2013 10:30 AM [facility] [place] [phone]   12/10/2013 8:45 AM Lisabeth Pick, MD [facility]   12/11/2013 10:30 AM [facility] [place] [phone]   12/13/2013  10:30 AM [facility] [place] [phone]   12/18/2013 10:30 AM [facility] [place] [phone]   12/20/2013 10:30 AM [facility] [place] [phone]   12/25/2013 10:30 AM [facility] [place] [phone]   12/27/2013 10:30 AM [facility] [place] [phone]   01/01/2014 10:30 AM [facility] [place] [phone]   01/03/2014 10:30 AM [facility] [place] [phone]   01/08/2014 10:30 AM [facility] [place] [phone]   01/10/2014 10:30 AM [facility] [place] [phone]   01/15/2014 10:30 AM [facility] [place] [phone]   01/17/2014 10:30 AM [facility] [place] [phone]   01/22/2014 10:30 AM [facility] [place] [phone]   01/24/2014 10:30 AM [facility] [place] [phone]   01/29/2014 10:30 AM [facility] [place] [phone]   01/31/2014 10:30 AM [facility] [place] [phone]   02/05/2014 10:30 AM [facility] [place] [phone]   02/07/2014 10:30 AM [facility] [place] [phone]   02/12/2014 10:30 AM [facility] [place] [phone]   02/14/2014 10:30 AM [facility] [place] [phone]   02/19/2014 10:30 AM [facility] [place] [phone]   02/21/2014 10:30 AM [facility] [place] [phone]   02/26/2014 10:30 AM [facility] [place] [phone]   02/28/2014 10:30 AM [facility] [place] [phone]   Future Orders Complete By Expires   (HEART FAILURE PATIENTS) Call MD:  Anytime you have any of the following symptoms: 1) 3 pound weight gain in 24 hours or 5 pounds in 1 week 2) shortness of breath, with or without a dry hacking cough 3) swelling in the hands, feet or stomach 4) if you have to sleep on extra pillows at night in order to breathe.  As directed    Call MD for:  temperature >100.4  As directed    Call MD for:  As directed    Scheduling Instructions:     Worsening shortness of breath or cough.   Diet - low sodium heart healthy  As directed    Diet Carb Modified  As directed    Discharge instructions  As directed    Comments:     Take 30 mg of Prednisone tomorrow, 20 mg on 12/06/13, then go back to your usual dose of 10 mg daily.  You were cared for by Dr. Jacquelynn Cree  (a hospitalist) during your hospital stay. If you have any questions about your discharge medications or the care you received while you were in the hospital after you are  discharged, you can call the unit and ask to speak with the hospitalist on call if the hospitalist that took care of you is not available. Once you are discharged, your primary care physician will handle any further medical issues. Please note that NO REFILLS for any discharge medications will be authorized once you are discharged, as it is imperative that you return to your primary care physician (or establish a relationship with a primary care physician if you do not  have one) for your aftercare needs so that they can reassess your need for medications and monitor your lab values.  Any outstanding tests can be reviewed by your PCP at your follow up visit.  It is also important to review any medicine changes with your PCP.  Please bring these d/c instructions with you to your next visit so your physician can review these changes with you.  If you do not have a primary care physician, you can call [phone] for a physician referral.  It is highly recommended that you obtain a PCP for hospital follow up.   Increase activity slowly  As directed        Medication List    STOP taking these medications       azithromycin 250 MG tablet  Commonly known as:  ZITHROMAX     HYDROcodone-homatropine 5-1.5 MG/5ML syrup  Commonly known as:  HYCODAN      TAKE these medications       albuterol 108 (90 BASE) MCG/ACT inhaler  Commonly known as:  PROVENTIL HFA;VENTOLIN HFA  Inhale 2 puffs into the lungs every 6 (six) hours as needed. For wheezing.     albuterol (5 MG/ML) 0.5% nebulizer solution  Commonly known as:  PROVENTIL  Take 2.5 mg by nebulization every 6 (six) hours as needed. For wheezing.     allopurinol 100 MG tablet  Commonly known as:  ZYLOPRIM  Take 100 mg by mouth at bedtime.     aspirin EC 81 MG tablet  Take 81 mg by mouth daily.     atorvastatin 40 MG tablet  Commonly known as:  LIPITOR  Take 40 mg by mouth at bedtime.     B-D ULTRAFINE III SHORT PEN 31G X 8 MM Misc  Generic drug:  Insulin Pen Needle  Follow directions provided by physician     benzonatate 200 MG capsule  Commonly known as:  TESSALON  Take 1 capsule (200 mg total) by mouth 3 (three) times daily.     buPROPion 300 MG 24 hr tablet  Commonly known as:  WELLBUTRIN XL  Take 300 mg by mouth daily.     carvedilol 25 MG tablet  Commonly known as:  COREG  Take 1 tablet (25 mg total) by mouth 2 (two) times daily with a meal.     chlorpheniramine-HYDROcodone 10-8 MG/5ML Lqcr   Commonly known as:  TUSSIONEX  Take 5 mLs by mouth every 12 (twelve) hours.     cholecalciferol 1000 UNITS tablet  Commonly known as:  VITAMIN D  Take 1,000 Units by mouth 2 (two) times daily.     ciclesonide 50 MCG/ACT nasal spray  Commonly known as:  OMNARIS  Place 1 spray into both nostrils daily.     DEXILANT 60 MG capsule  Generic drug:  dexlansoprazole  Take one capsule by mouth one time daily     dextromethorphan-guaiFENesin 30-600 MG per 12 hr tablet  Commonly known as:  MUCINEX DM  Take 1 tablet by mouth 2 (two) times  daily.     diltiazem 120 MG 24 hr capsule  Commonly known as:  CARDIZEM CD  Take 1 capsule (120 mg total) by mouth every evening.     estradiol 2 MG tablet  Commonly known as:  ESTRACE  Take 2 mg by mouth at bedtime.     fenofibrate 145 MG tablet  Commonly known as:  TRICOR  Take one tablet by mouth one time daily     furosemide 40 MG tablet  Commonly known as:  LASIX  Take 1 tablet (40 mg total) by mouth 2 (two) times daily. Take additional 40 mg in AM of 7/15 & 7/16 - to shoot for dry wgt of ~2 lb below current weight.     glucose blood test strip  Commonly known as:  ONE TOUCH ULTRA TEST  1 each by Other route 3 (three) times daily. Use as instructed     glyBURIDE 2.5 MG tablet  Commonly known as:  DIABETA  TAKE 1 TABLET BY MOUTH DAILY WITH BREAKFAST.     insulin glargine 100 UNIT/ML injection  Commonly known as:  LANTUS  Inject 23 Units into the skin at bedtime.     levofloxacin 500 MG tablet  Commonly known as:  LEVAQUIN  Take 1 tablet (500 mg total) by mouth daily.     LORazepam 0.5 MG tablet  Commonly known as:  ATIVAN  Take 1 tablet (0.5 mg total) by mouth 2 (two) times daily as needed for anxiety.     losartan 100 MG tablet  Commonly known as:  COZAAR  Take 100 mg by mouth at bedtime.     metFORMIN 500 MG tablet  Commonly known as:  GLUCOPHAGE  Take 500 mg by mouth 2 (two) times daily with a meal.      mometasone-formoterol 200-5 MCG/ACT Aero  Commonly known as:  DULERA  Inhale 2 puffs into the lungs 2 (two) times daily.     nitroGLYCERIN 0.4 MG SL tablet  Commonly known as:  NITROSTAT  Place 0.4 mg under the tongue every 5 (five) minutes as needed. x3 doses as needed for chest pain.     omeprazole 20 MG capsule  Commonly known as:  PRILOSEC  Take 1 capsule (20 mg total) by mouth 2 (two) times daily before a meal.     potassium chloride SA 20 MEQ tablet  Commonly known as:  K-DUR,KLOR-CON  Take 30 mEq by mouth 2 (two) times daily.     predniSONE 5 MG tablet  Commonly known as:  DELTASONE  Take 10 mg by mouth every morning.     sucralfate 1 GM/10ML suspension  Commonly known as:  CARAFATE  Take 10 mLs (1 g total) by mouth 4 (four) times daily -  with meals and at bedtime.     venlafaxine XR 75 MG 24 hr capsule  Commonly known as:  EFFEXOR-XR  Take 150 mg by mouth at bedtime.       Follow-up Information   Follow up with [facility] Schedule an appointment as soon as possible for a visit in 1 month. (For further evaluation of your vocal cord dysfunction.)    Contact information:   [place] [address] [phone]      Follow up with Chancy Hurter, MD. Schedule an appointment as soon as possible for a visit in 2 weeks. [facility] follow up.)    Specialties:  Internal Medicine, Radiology   Contact information:  [place] [address] [phone]        The results of significant diagnostics from this hospitalization (including imaging, microbiology, ancillary and laboratory) are listed below for reference.    Significant Diagnostic Studies: Dg Chest 2 View  12/02/2013   CLINICAL DATA:  Shortness of breath, wheezing and chest pain.  EXAM: CHEST  2 VIEW  COMPARISON:  Chest radiograph performed 11/29/2013  FINDINGS: The lungs are hypoexpanded. Vascular crowding is noted. Mild left  basilar opacity likely reflects atelectasis. No definite pleural effusion or pneumothorax is seen.  The heart is borderline enlarged. No acute osseous abnormalities are seen. Postoperative change is noted overlying the gastroesophageal junction.  IMPRESSION: Lungs hypoexpanded; mild left basilar airspace opacity likely reflects atelectasis. Borderline cardiomegaly noted.   Electronically Signed   By: Garald Balding M.D.   On: 12/02/2013 21:23   Dg Chest Portable 1 View  11/29/2013   CLINICAL DATA:  Shortness of breath, cough.  EXAM: PORTABLE CHEST - 1 VIEW  COMPARISON:  10/02/2013  FINDINGS: Cardiomegaly. Lungs are clear. No effusions or edema. No acute bony abnormality.  IMPRESSION: Cardiomegaly.  No active disease.   Electronically Signed   By: Rolm Baptise M.D.   On: 11/29/2013 01:25    Labs:  Basic Metabolic Panel:  Recent Labs Lab 11/29/13 0150 12/02/13 2017 12/04/13 0320  NA 137 135* 134*  K 4.0 3.8 4.0  CL 95* 91* 93*  CO2  --  25 25  GLUCOSE 291* 281* 343*  BUN 18 20 19   CREATININE 0.80 0.70 0.68  CALCIUM  --  9.7 9.2   GFR Estimated Creatinine Clearance: 104.4 ml/min (by C-G formula based on Cr of 0.68).  CBC:  Recent Labs Lab 11/29/13 0144 11/29/13 0150 12/02/13 2017 12/04/13 0320  WBC 14.0*  --  15.0* 15.9*  NEUTROABS 9.0*  --  10.5*  --   HGB 11.5* 12.2 11.5* 10.9*  HCT 34.7* 36.0 34.4* 33.0*  MCV 89.2  --  88.4 88.9  PLT 379  --  413* 401*   Cardiac Enzymes:  Recent Labs Lab 12/03/13 0126 12/03/13 0649  TROPONINI <0.30 <0.30   CBG:  Recent Labs Lab 12/04/13 1204 12/04/13 1700 12/04/13 2110 12/05/13 0722 12/05/13 1200  GLUCAP 299* 354* 289* 122* 245*   D-Dimer  Recent Labs  12/03/13 0126  DDIMER 0.31   Microbiology Recent Results (from the past 240 hour(s))  MRSA PCR SCREENING     Status: None   Collection Time    12/03/13  1:52 AM      Result Value Ref Range Status   MRSA by PCR NEGATIVE  NEGATIVE Final   Comment:            The  GeneXpert MRSA Assay (FDA     approved for NASAL specimens     only), is one component of a     comprehensive MRSA colonization     surveillance program. It is not     intended to diagnose MRSA     infection nor to guide or     monitor treatment for     MRSA infections.  CULTURE, EXPECTORATED SPUTUM-ASSESSMENT     Status: None   Collection Time    12/03/13  2:35 PM      Result Value Ref Range Status   Specimen Description SPUTUM   Final   Special Requests NONE   Final   Sputum evaluation     Final   Value: MICROSCOPIC FINDINGS SUGGEST  THAT THIS SPECIMEN IS NOT REPRESENTATIVE OF LOWER RESPIRATORY SECRETIONS. PLEASE RECOLLECT.     Gram Stain Report Called to,Read Back By and Verified With: A.LAWSON AT 1500 ON 2.23.25 BY SHEAW   Report Status 12/03/2013 FINAL   Final  CULTURE, RESPIRATORY (NON-EXPECTORATED)     Status: None   Collection Time    12/03/13  9:41 PM      Result Value Ref Range Status   Specimen Description SPUTUM   Final   Special Requests NONE   Final   Gram Stain     Final   Value: FEW WBC PRESENT, PREDOMINANTLY PMN     FEW SQUAMOUS EPITHELIAL CELLS PRESENT     FEW GRAM POSITIVE COCCI     IN PAIRS IN CHAINS     Performed at [facility]   Culture     Final   Value: NORMAL OROPHARYNGEAL FLORA     Performed at [facility]   Report Status PENDING   Incomplete  CULTURE, EXPECTORATED SPUTUM-ASSESSMENT     Status: None   Collection Time    12/03/13 10:22 PM      Result Value Ref Range Status   Specimen Description SPUTUM   Final   Special Requests NONE   Final   Sputum evaluation     Final   Value: THIS SPECIMEN IS ACCEPTABLE. RESPIRATORY CULTURE REPORT TO FOLLOW.   Report Status 12/04/2013 FINAL   Final    Time coordinating discharge: 35 minutes.  Signed:  RAMA,CHRISTINA  Pager [phone] [facility] 12/05/2013, 5:45 PM

## 2013-12-05 NOTE — Discharge Instructions (Signed)
Bronchospasm, Adult A bronchospasm is a spasm or tightening of the airways going into the lungs. During a bronchospasm breathing becomes more difficult because the airways get smaller. When this happens there can be coughing, a whistling sound when breathing (wheezing), and difficulty breathing. Bronchospasm is often associated with asthma, but not all patients who experience a bronchospasm have asthma. CAUSES  A bronchospasm is caused by inflammation or irritation of the airways. The inflammation or irritation may be triggered by:   Allergies (such as to animals, pollen, food, or mold). Allergens that cause bronchospasm may cause wheezing immediately after exposure or many hours later.   Infection. Viral infections are believed to be the most common cause of bronchospasm.   Exercise.   Irritants (such as pollution, cigarette smoke, strong odors, aerosol sprays, and paint fumes).   Weather changes. Winds increase molds and pollens in the air. Rain refreshes the air by washing irritants out. Cold air may cause inflammation.   Stress and emotional upset.  SIGNS AND SYMPTOMS   Wheezing.   Excessive nighttime coughing.   Frequent or severe coughing with a simple cold.   Chest tightness.   Shortness of breath.  DIAGNOSIS  Bronchospasm is usually diagnosed through a history and physical exam. Tests, such as chest X-rays, are sometimes done to look for other conditions. TREATMENT   Inhaled medicines can be given to open up your airways and help you breathe. The medicines can be given using either an inhaler or a nebulizer machine.  Corticosteroid medicines may be given for severe bronchospasm, usually when it is associated with asthma. HOME CARE INSTRUCTIONS   Always have a plan prepared for seeking medical care. Know when to call your health care provider and local emergency services (911 in the [place] Know where you can access local emergency care.  Only take medicines as  directed by your health care provider.  If you were prescribed an inhaler or nebulizer machine, ask your health care provider to explain how to use it correctly. Always use a spacer with your inhaler if you were given one.  It is necessary to remain calm during an attack. Try to relax and breathe more slowly.  Control your home environment in the following ways:   Change your heating and air conditioning filter at least once a month.   Limit your use of fireplaces and wood stoves.  Do not smoke and do not allow smoking in your home.   Avoid exposure to perfumes and fragrances.   Get rid of pests (such as roaches and mice) and their droppings.   Throw away plants if you see mold on them.   Keep your house clean and dust free.   Replace carpet with wood, tile, or vinyl flooring. Carpet can trap dander and dust.   Use allergy-proof pillows, mattress covers, and box spring covers.   Wash bed sheets and blankets every week in hot water and dry them in a dryer.   Use blankets that are made of polyester or cotton.   Wash hands frequently. SEEK MEDICAL CARE IF:   You have muscle aches.   You have chest pain.   The sputum changes from clear or white to yellow, green, gray, or bloody.   The sputum you cough up gets thicker.   There are problems that may be related to the medicine you are given, such as a rash, itching, swelling, or trouble breathing.  SEEK IMMEDIATE MEDICAL CARE IF:   You have worsening wheezing and coughing  even after taking your prescribed medicines.   You have increased difficulty breathing.   You develop severe chest pain. MAKE SURE YOU:   Understand these instructions.  Will watch your condition.  Will get help right away if you are not doing well or get worse. Document Released: 09/30/2003 Document Revised: 05/30/2013 Document Reviewed: 03/19/2013 [facility] Patient Information 2014 [place].  Cough, Adult  A cough is a  reflex that helps clear your throat and airways. It can help heal the body or may be a reaction to an irritated airway. A cough may only last 2 or 3 weeks (acute) or may last more than 8 weeks (chronic).  CAUSES Acute cough:  Viral or bacterial infections. Chronic cough:  Infections.  Allergies.  Asthma.  Post-nasal drip.  Smoking.  Heartburn or acid reflux.  Some medicines.  Chronic lung problems (COPD).  Cancer. SYMPTOMS   Cough.  Fever.  Chest pain.  Increased breathing rate.  High-pitched whistling sound when breathing (wheezing).  Colored mucus that you cough up (sputum). TREATMENT   A bacterial cough may be treated with antibiotic medicine.  A viral cough must run its course and will not respond to antibiotics.  Your caregiver may recommend other treatments if you have a chronic cough. HOME CARE INSTRUCTIONS   Only take over-the-counter or prescription medicines for pain, discomfort, or fever as directed by your caregiver. Use cough suppressants only as directed by your caregiver.  Use a cold steam vaporizer or humidifier in your bedroom or home to help loosen secretions.  Sleep in a semi-upright position if your cough is worse at night.  Rest as needed.  Stop smoking if you smoke. SEEK IMMEDIATE MEDICAL CARE IF:   You have pus in your sputum.  Your cough starts to worsen.  You cannot control your cough with suppressants and are losing sleep.  You begin coughing up blood.  You have difficulty breathing.  You develop pain which is getting worse or is uncontrolled with medicine.  You have a fever. MAKE SURE YOU:   Understand these instructions.  Will watch your condition.  Will get help right away if you are not doing well or get worse. Document Released: 03/26/2011 Document Revised: 12/20/2011 Document Reviewed: 03/26/2011 [facility] Patient Information 2014 [place].

## 2013-12-06 ENCOUNTER — Encounter (HOSPITAL_COMMUNITY): Payer: BC Managed Care – PPO

## 2013-12-06 LAB — CULTURE, RESPIRATORY W GRAM STAIN: Culture: NORMAL

## 2013-12-10 ENCOUNTER — Ambulatory Visit (INDEPENDENT_AMBULATORY_CARE_PROVIDER_SITE_OTHER): Payer: BC Managed Care – PPO | Admitting: Internal Medicine

## 2013-12-10 ENCOUNTER — Encounter: Payer: Self-pay | Admitting: Internal Medicine

## 2013-12-10 VITALS — BP 148/64 | HR 84 | Temp 98.0°F | Ht 66.0 in | Wt 262.0 lb

## 2013-12-10 DIAGNOSIS — IMO0002 Reserved for concepts with insufficient information to code with codable children: Secondary | ICD-10-CM

## 2013-12-10 DIAGNOSIS — E1149 Type 2 diabetes mellitus with other diabetic neurological complication: Secondary | ICD-10-CM

## 2013-12-10 DIAGNOSIS — E1165 Type 2 diabetes mellitus with hyperglycemia: Secondary | ICD-10-CM

## 2013-12-10 DIAGNOSIS — S92309A Fracture of unspecified metatarsal bone(s), unspecified foot, initial encounter for closed fracture: Secondary | ICD-10-CM | POA: Diagnosis not present

## 2013-12-10 LAB — HEPATIC FUNCTION PANEL
ALT: 13 U/L (ref 0–35)
AST: 10 U/L (ref 0–37)
Albumin: 3.5 g/dL (ref 3.5–5.2)
Alkaline Phosphatase: 46 U/L (ref 39–117)
Bilirubin, Direct: 0 mg/dL (ref 0.0–0.3)
Total Bilirubin: 0.4 mg/dL (ref 0.3–1.2)
Total Protein: 6.5 g/dL (ref 6.0–8.3)

## 2013-12-10 LAB — HEMOGLOBIN A1C: Hgb A1c MFr Bld: 8.7 % — ABNORMAL HIGH (ref 4.6–6.5)

## 2013-12-10 LAB — BASIC METABOLIC PANEL
BUN: 20 mg/dL (ref 6–23)
CO2: 29 mEq/L (ref 19–32)
Calcium: 9.6 mg/dL (ref 8.4–10.5)
Chloride: 98 mEq/L (ref 96–112)
Creatinine, Ser: 0.8 mg/dL (ref 0.4–1.2)
GFR: 82.92 mL/min (ref >60.00)
Glucose, Bld: 225 mg/dL — ABNORMAL HIGH (ref 70–99)
Potassium: 4.1 mEq/L (ref 3.5–5.1)
Sodium: 137 mEq/L (ref 135–145)

## 2013-12-10 LAB — LIPID PANEL
Cholesterol: 211 mg/dL — ABNORMAL HIGH (ref 0–200)
HDL: 44.8 mg/dL (ref >39.00)
Total CHOL/HDL Ratio: 5
Triglycerides: 508 mg/dL — ABNORMAL HIGH (ref 0.0–149.0)
VLDL: 101.6 mg/dL — ABNORMAL HIGH (ref 0.0–40.0)

## 2013-12-10 LAB — MICROALBUMIN / CREATININE URINE RATIO
Creatinine,U: 71.9 mg/dL
Microalb Creat Ratio: 0.3 mg/g (ref 0.0–30.0)
Microalb, Ur: 0.2 mg/dL (ref 0.0–1.9)

## 2013-12-10 LAB — LDL CHOLESTEROL, DIRECT: Direct LDL: 117.9 mg/dL

## 2013-12-10 NOTE — Progress Notes (Signed)
Pre visit review using our clinic review tool, if applicable. No additional management support is needed unless otherwise documented below in the visit note. 

## 2013-12-10 NOTE — Assessment & Plan Note (Signed)
The key long-term success his weight loss. Advised low-calorie diet and daily exercise. She is due for laboratory work today.

## 2013-12-10 NOTE — Progress Notes (Signed)
Very complicated patient. She has known sarcoidosis and multiple related complications. Recent hospitalization- reviewed records. She is scheduled for pulmonary rehab.  Left foot fracture- dr. Noemi Chapel is treating.   She is here to followup diabetes. She is not really following a diet. She does not exercise regularly. She does take her medications.  Past Medical History  Diagnosis Date  . DIABETES MELLITUS, TYPE II 08/21/2006  . HYPERLIPIDEMIA 08/21/2006  . GOUT 08/20/2010  . OBESITY 06/04/2009  . ANEMIA-UNSPECIFIED 09/18/2009  . HYPERTENSION 08/21/2006  . GERD 08/21/2006  . SLEEP APNEA 04/21/2009    last testing was negative  . Internal hemorrhoids   . Pulmonary sarcoidosis     Followed locally by pulmonology, but also by Dr. Casper Harrison at [facility]  . Exertional chest pain     sharp, substernal, exertional  . Vocal cord dysfunction   . Chronic diastolic heart failure, NYHA class 2     LVEDP roughly 20% by cath  . CHF (congestive heart failure)     History   Social History  . Marital Status: Married    Spouse Name: GARYN WAGUESPACK    Number of Children: 2  . Years of Education: 12   Occupational History  . DISABLED    Social History Main Topics  . Smoking status: Never Smoker   . Smokeless tobacco: Never Used  . Alcohol Use: No  . Drug Use: No  . Sexual Activity: Yes   Other Topics Concern  . Not on file   Social History Narrative  . No narrative on file    Past Surgical History  Procedure Laterality Date  . Ventral hernia repair    . Nissen fundoplication  8841  . Cholecystectomy  1984  . Abdominal hysterectomy    . Knee arthroscopy      right  . Tubal ligation      with reversal in 1994  . Bladder suspension  11/11/2011    Procedure: TRANSVAGINAL TAPE (TVT) PROCEDURE;  Surgeon: Olga Millers, MD;  Location: [facility];  Service: Gynecology;  Laterality: N/A;  . Cystoscopy  11/11/2011    Procedure: CYSTOSCOPY;  Surgeon: Olga Millers, MD;   Location: [facility];  Service: Gynecology;  Laterality: N/A;  . Doppler echocardiography  02/12/2013    LV FUNCTION, SIZE NORMAL; MILD CONCENTRIC LVH; EST EF 55-65%; WALL MOTION NORMAL  . Lexiscan myoview  03/09/2013    EF 50%; NORMAL MYOCARDIAL PERFUSION STUDY - breast attenuation  . Cardiac catheterization  07/2010    LVEF 50-55% WITH VERY MILD GLOBAL HYPOKINESIA; ESSENTIALLY NORMAL CORONARY ARTERIES; NORMAL LV FUNCTION  . Carotids  02/18/11    CAROTID DUPLEX; VERTEBRALS ARE PATENT WITH ANTEGRADE FLOW. ICA/CCA RATIO 1.61 ON RIGHT AND 0.75 ON LEFT  . Right and left cardiac catheterization  04/23/2013    Angiographic normal coronaries; LVEDP 20 mmHg, PCWP 12-14 mmHg, RAP 12 mmHg.; Fick CO/CI 4.9/2.2    Family History  Problem Relation Age of Onset  . Diabetes Father   . Heart attack Father   . Coronary artery disease Father   . Heart failure Father   . COPD Mother   . Emphysema Mother   . Asthma Mother   . Heart failure Mother   . Sarcoidosis Maternal Uncle   . Colon cancer Neg Hx   . Lung cancer Brother   . Cancer Brother   . Diabetes Brother   . Heart attack Maternal Grandfather     Allergies  Allergen Reactions  . Lisinopril  Cough  . Methotrexate     REACTION: peri-oral and buccal lesions.  . Clindamycin/Lincomycin Nausea And Vomiting and Rash    Current Outpatient Prescriptions on File Prior to Visit  Medication Sig Dispense Refill  . albuterol (PROVENTIL HFA;VENTOLIN HFA) 108 (90 BASE) MCG/ACT inhaler Inhale 2 puffs into the lungs every 6 (six) hours as needed. For wheezing.      [name] albuterol (PROVENTIL) (5 MG/ML) 0.5% nebulizer solution Take 2.5 mg by nebulization every 6 (six) hours as needed. For wheezing.      [name] allopurinol (ZYLOPRIM) 100 MG tablet Take 100 mg by mouth at bedtime.       [name] aspirin EC 81 MG tablet Take 81 mg by mouth daily.       [name] atorvastatin (LIPITOR) 40 MG tablet Take 40 mg by mouth at bedtime.      . B-D ULTRAFINE III SHORT PEN 31G X 8 MM MISC Follow  directions provided by physician  30 each  2  . benzonatate (TESSALON) 200 MG capsule Take 1 capsule (200 mg total) by mouth 3 (three) times daily.  20 capsule  0  . buPROPion (WELLBUTRIN XL) 300 MG 24 hr tablet Take 300 mg by mouth daily.      . carvedilol (COREG) 25 MG tablet Take 1 tablet (25 mg total) by mouth 2 (two) times daily with a meal.  180 tablet  3  . chlorpheniramine-HYDROcodone (TUSSIONEX) 10-8 MG/5ML LQCR Take 5 mLs by mouth every 12 (twelve) hours.  115 mL  0  . cholecalciferol (VITAMIN D) 1000 UNITS tablet Take 1,000 Units by mouth 2 (two) times daily.      . ciclesonide (OMNARIS) 50 MCG/ACT nasal spray Place 1 spray into both nostrils daily.       [name] DEXILANT 60 MG capsule Take one capsule by mouth one time daily  30 capsule  5  . dextromethorphan-guaiFENesin (MUCINEX DM) 30-600 MG per 12 hr tablet Take 1 tablet by mouth 2 (two) times daily.  60 tablet  0  . diltiazem (CARDIZEM CD) 120 MG 24 hr capsule Take 1 capsule (120 mg total) by mouth every evening.  30 capsule  11  . estradiol (ESTRACE) 2 MG tablet Take 2 mg by mouth at bedtime.       . fenofibrate (TRICOR) 145 MG tablet Take one tablet by mouth one time daily  30 tablet  10  . furosemide (LASIX) 40 MG tablet Take 1 tablet (40 mg total) by mouth 2 (two) times daily. Take additional 40 mg in AM of 7/15 & 7/16 - to shoot for dry wgt of ~2 lb below current weight.  45 tablet  12  . glucose blood (ONE TOUCH ULTRA TEST) test strip 1 each by Other route 3 (three) times daily. Use as instructed  100 each  5  . glyBURIDE (DIABETA) 2.5 MG tablet TAKE 1 TABLET BY MOUTH DAILY WITH BREAKFAST.  30 tablet  2  . insulin glargine (LANTUS) 100 UNIT/ML injection Inject 23 Units into the skin at bedtime.      [name] levofloxacin (LEVAQUIN) 500 MG tablet Take 1 tablet (500 mg total) by mouth daily.  3 tablet  0  . LORazepam (ATIVAN) 0.5 MG tablet Take 1 tablet (0.5 mg total) by mouth 2 (two) times daily as needed for anxiety.  30 tablet  1  .  losartan (COZAAR) 100 MG tablet Take 100 mg by mouth at bedtime.      . metFORMIN (GLUCOPHAGE) 500 MG tablet Take  500 mg by mouth 2 (two) times daily with a meal.      . mometasone-formoterol (DULERA) 200-5 MCG/ACT AERO Inhale 2 puffs into the lungs 2 (two) times daily.  1 Inhaler  6  . nitroGLYCERIN (NITROSTAT) 0.4 MG SL tablet Place 0.4 mg under the tongue every 5 (five) minutes as needed. x3 doses as needed for chest pain.      [name] omeprazole (PRILOSEC) 20 MG capsule Take 1 capsule (20 mg total) by mouth 2 (two) times daily before a meal.  90 capsule  1  . potassium chloride SA (K-DUR,KLOR-CON) 20 MEQ tablet Take 30 mEq by mouth 2 (two) times daily.      . predniSONE (DELTASONE) 5 MG tablet Take 10 mg by mouth every morning.       . sucralfate (CARAFATE) 1 GM/10ML suspension Take 10 mLs (1 g total) by mouth 4 (four) times daily -  with meals and at bedtime.  420 mL  0  . venlafaxine XR (EFFEXOR-XR) 75 MG 24 hr capsule Take 150 mg by mouth at bedtime.       No current facility-administered medications on file prior to visit.     patient denies chest pain, shortness of breath, orthopnea. Denies lower extremity edema, abdominal pain, change in appetite, change in bowel movements. Patient denies rashes, musculoskeletal complaints. No other specific complaints in a complete review of systems.   Reviewed vitals obese female in no acute distress. HEENT exam atraumatic, normocephalic, extraocular muscles are intact. Neck is supple. No jugular venous distention no thyromegaly. Chest clear to auscultation without increased work of breathing. Cardiac exam S1 and S2 are regular. Abdominal exam active bowel sounds, soft, nontender. Extremities no edema. Neurologic exam she is alert without any motor sensory deficits. Gait is normal.

## 2013-12-11 ENCOUNTER — Encounter (HOSPITAL_COMMUNITY): Payer: BC Managed Care – PPO

## 2013-12-12 ENCOUNTER — Telehealth: Payer: Self-pay

## 2013-12-12 NOTE — Telephone Encounter (Signed)
Relevant patient education assigned to patient using Emmi. ° °

## 2013-12-13 ENCOUNTER — Encounter (HOSPITAL_COMMUNITY): Payer: BC Managed Care – PPO

## 2013-12-13 ENCOUNTER — Telehealth: Payer: Self-pay | Admitting: Internal Medicine

## 2013-12-13 DIAGNOSIS — R490 Dysphonia: Secondary | ICD-10-CM | POA: Diagnosis not present

## 2013-12-13 NOTE — Telephone Encounter (Signed)
[name] requesting results from labs done on monday

## 2013-12-14 ENCOUNTER — Other Ambulatory Visit: Payer: Self-pay | Admitting: Internal Medicine

## 2013-12-14 DIAGNOSIS — E1149 Type 2 diabetes mellitus with other diabetic neurological complication: Secondary | ICD-10-CM

## 2013-12-14 DIAGNOSIS — IMO0002 Reserved for concepts with insufficient information to code with codable children: Secondary | ICD-10-CM

## 2013-12-14 DIAGNOSIS — E1165 Type 2 diabetes mellitus with hyperglycemia: Secondary | ICD-10-CM

## 2013-12-14 IMAGING — CT CT ANGIO CHEST
2 of 6 series · 19 of 46 positions shown · IV contrast (APPLIED)
Comparison: 12/17/2012

CLINICAL DATA: Back pain

CT ANGIOGRAPHY CHEST
TECHNIQUE: Multidetector CT imaging of the chest using the
standard protocol during bolus administration of intravenous
contrast. Multiplanar reconstructed images including MIPs were
obtained and reviewed to evaluate the vascular anatomy.
Contrast: 100mL OMNIPAQUE IOHEXOL 350 MG/ML SOLN

[Series 6: pulm embolism 1.0 b25f thin · axial · 0.61mm/px · z∈[-244,-28]mm · 16 of 238 slices shown]
[im 11/238  lung]
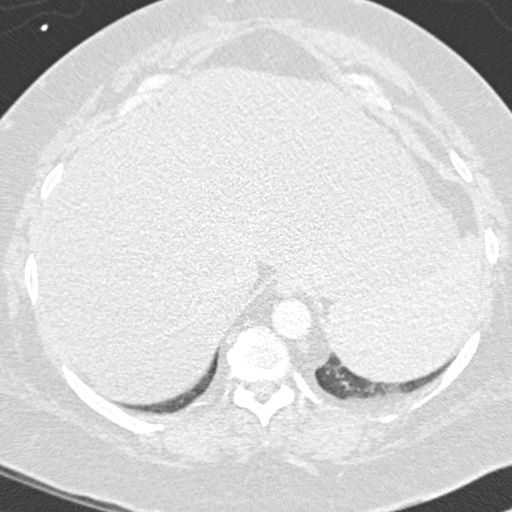
[im 31/238  soft-tissue]
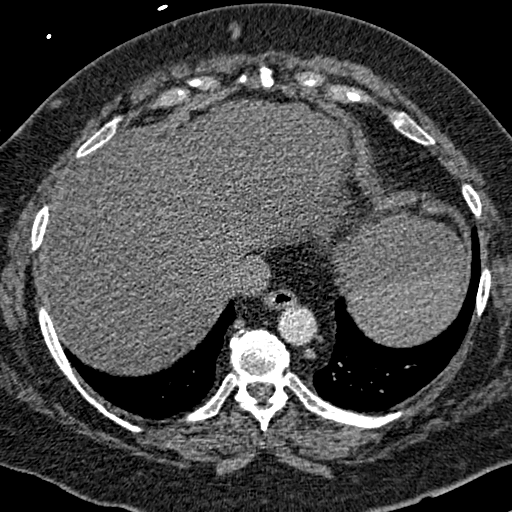
[im 42/238  lung]
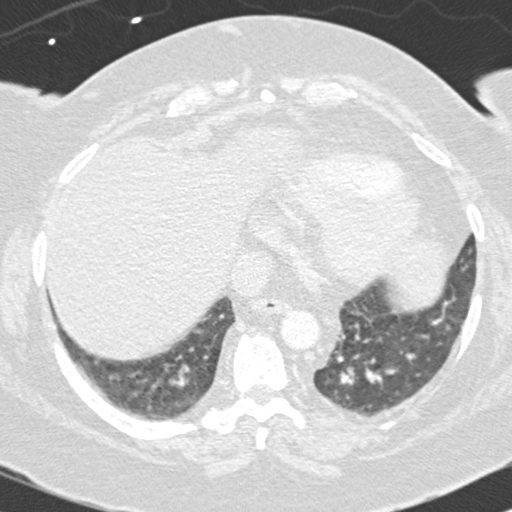
[im 52/238  soft-tissue]
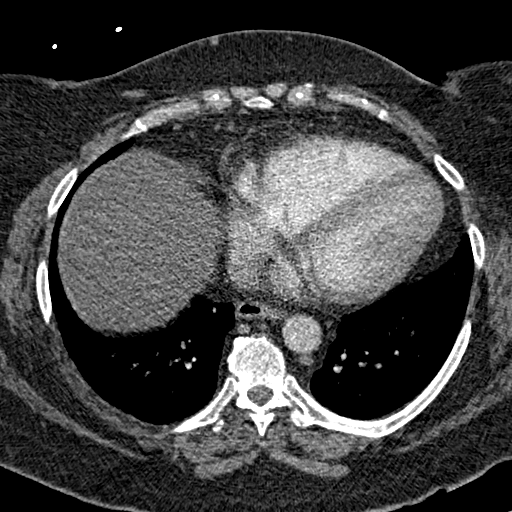
[im 73/238  lung]
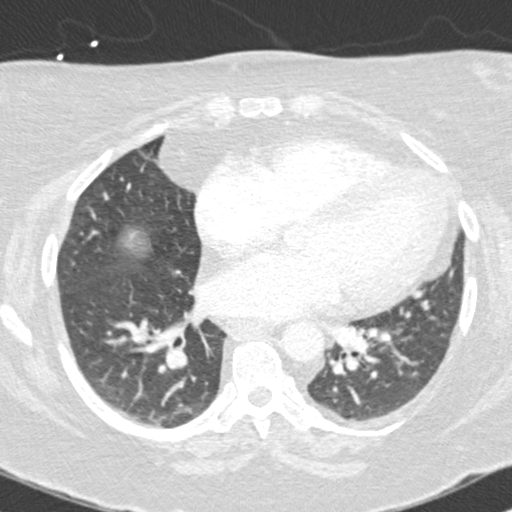
[im 83/238  soft-tissue]
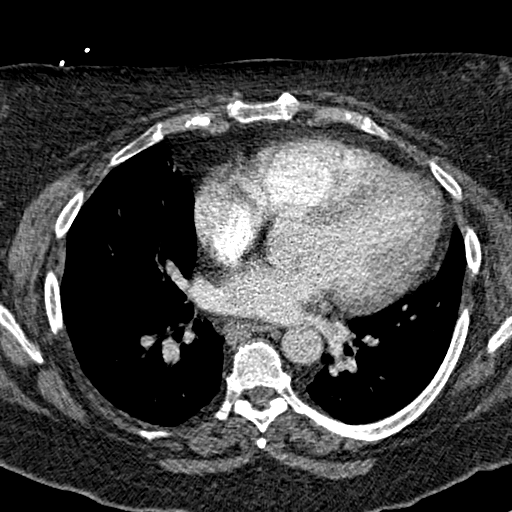
[im 93/238  lung]
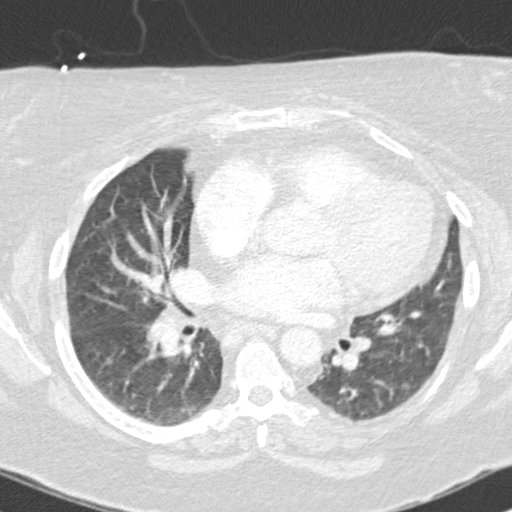
[im 114/238  soft-tissue]
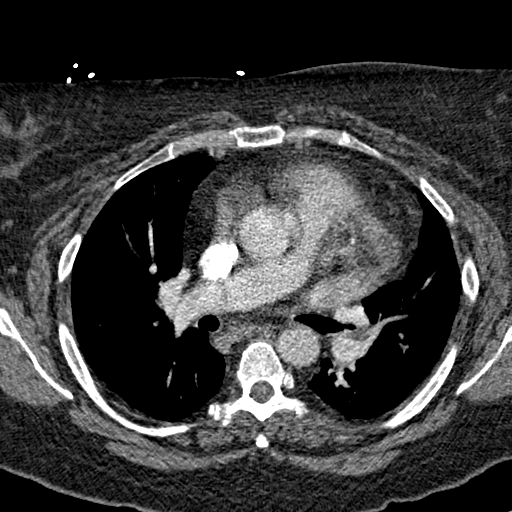
[im 124/238  lung]
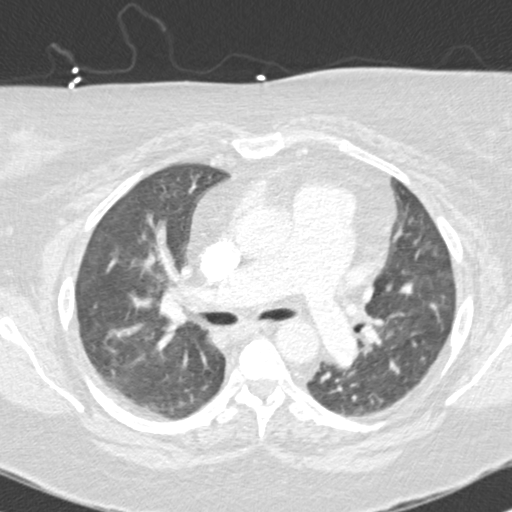
[im 145/238  soft-tissue]
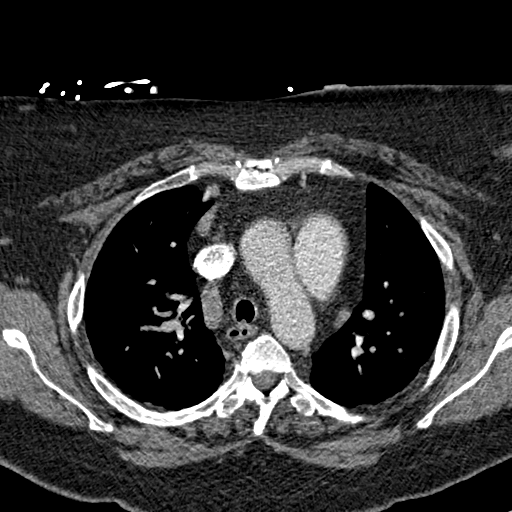
[im 155/238  lung]
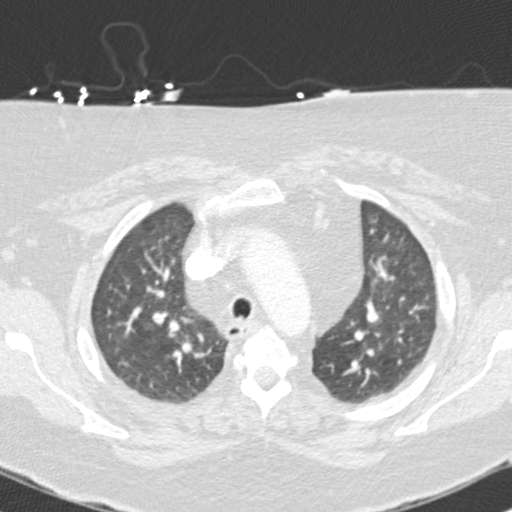
[im 165/238  soft-tissue]
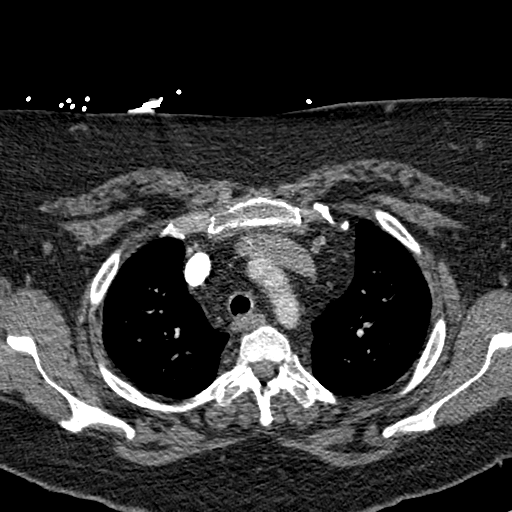
[im 186/238  lung]
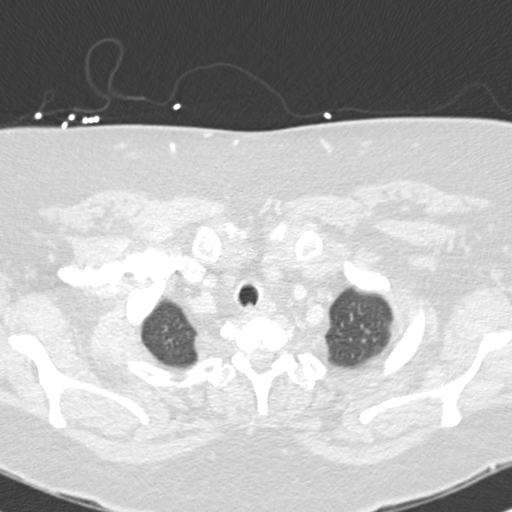
[im 196/238  soft-tissue]
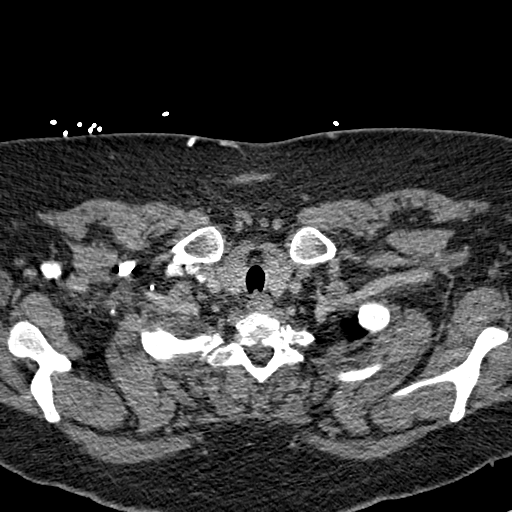
[im 207/238  lung]
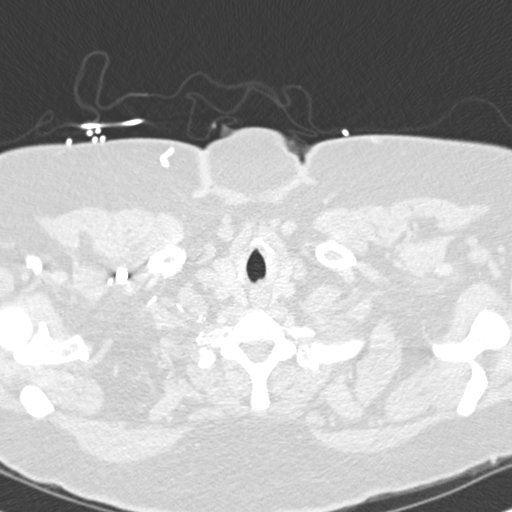
[im 227/238  soft-tissue]
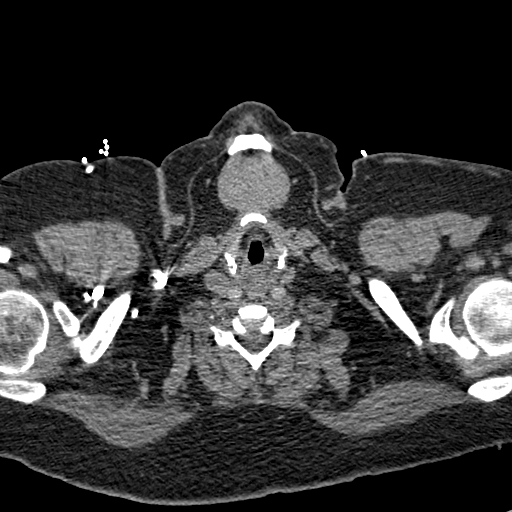

[Series 602: <mpr sagittal · coronal · 0.61mm/px · 3 of 94 slices shown]
[im 24/94  soft-tissue]
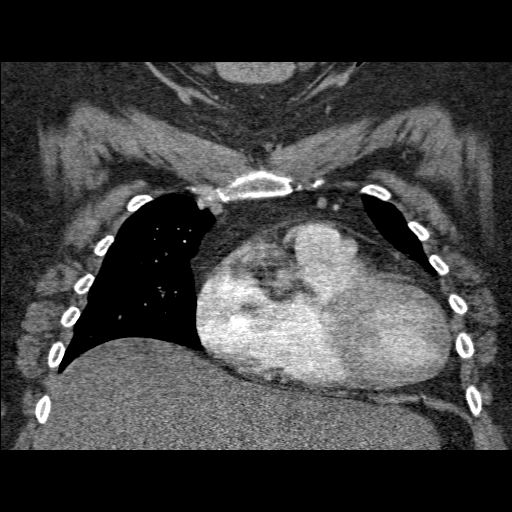
[im 47/94  soft-tissue]
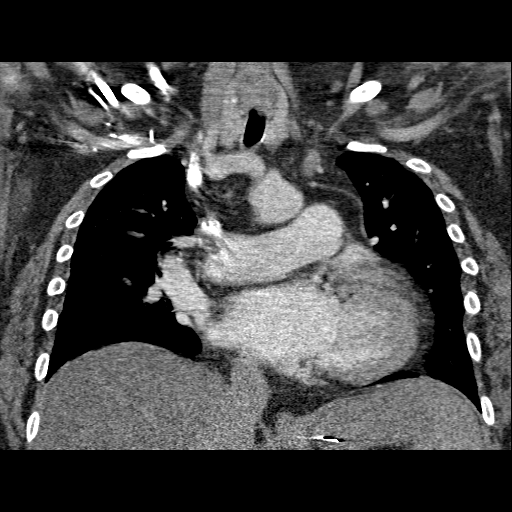
[im 70/94  soft-tissue]
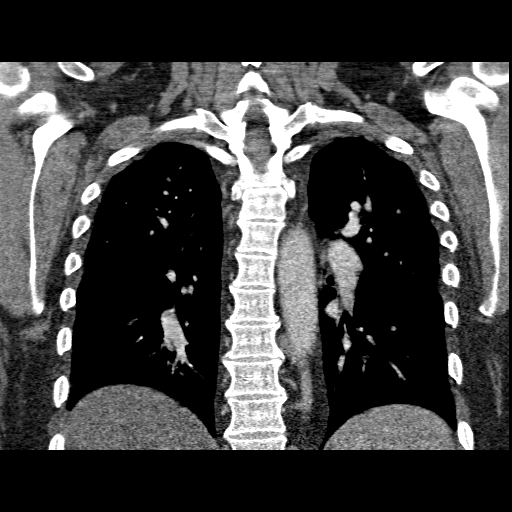

[19 of 46 positions shown; findings below may reference images not displayed]

FINDINGS: Lungs/pleura: There is no pleural effusion identified.
No airspace consolidation or atelectasis identified.  There is no
suspicious pulmonary parenchymal nodule or mass identified.

Heart/Mediastinum: Moderate cardiac enlargement.  Calcifications
within the LAD coronary artery noted.  No pericardial effusion.  No
enlarged mediastinal or hilar lymph nodes.

The main pulmonary artery is patent..  There is no abnormal filling
defect within the lumbar or segmental pulmonary arteries to suggest
a clinically significant pulmonary embolus.

Upper abdomen: Mild diffuse low attenuation throughout the liver
parenchyma is identified consistent with fatty infiltration.

Bones/Musculoskeletal:  Review of the visualized osseous structures
is remarkable for spondylosis within the thoracic spine.

No aggressive bone lesions identified.
IMPRESSION: 1.  No evidence for acute pulmonary embolus.
2.  Cardiac enlargement.

## 2013-12-14 IMAGING — CR DG CHEST 2V
2 series · 2 of 2 positions shown · non-contrast
Comparison: CT 12/17/2012, chest radiograph 12/16/2012

CLINICAL DATA: Chest pain

CHEST - 2 VIEW

[w chest pa]
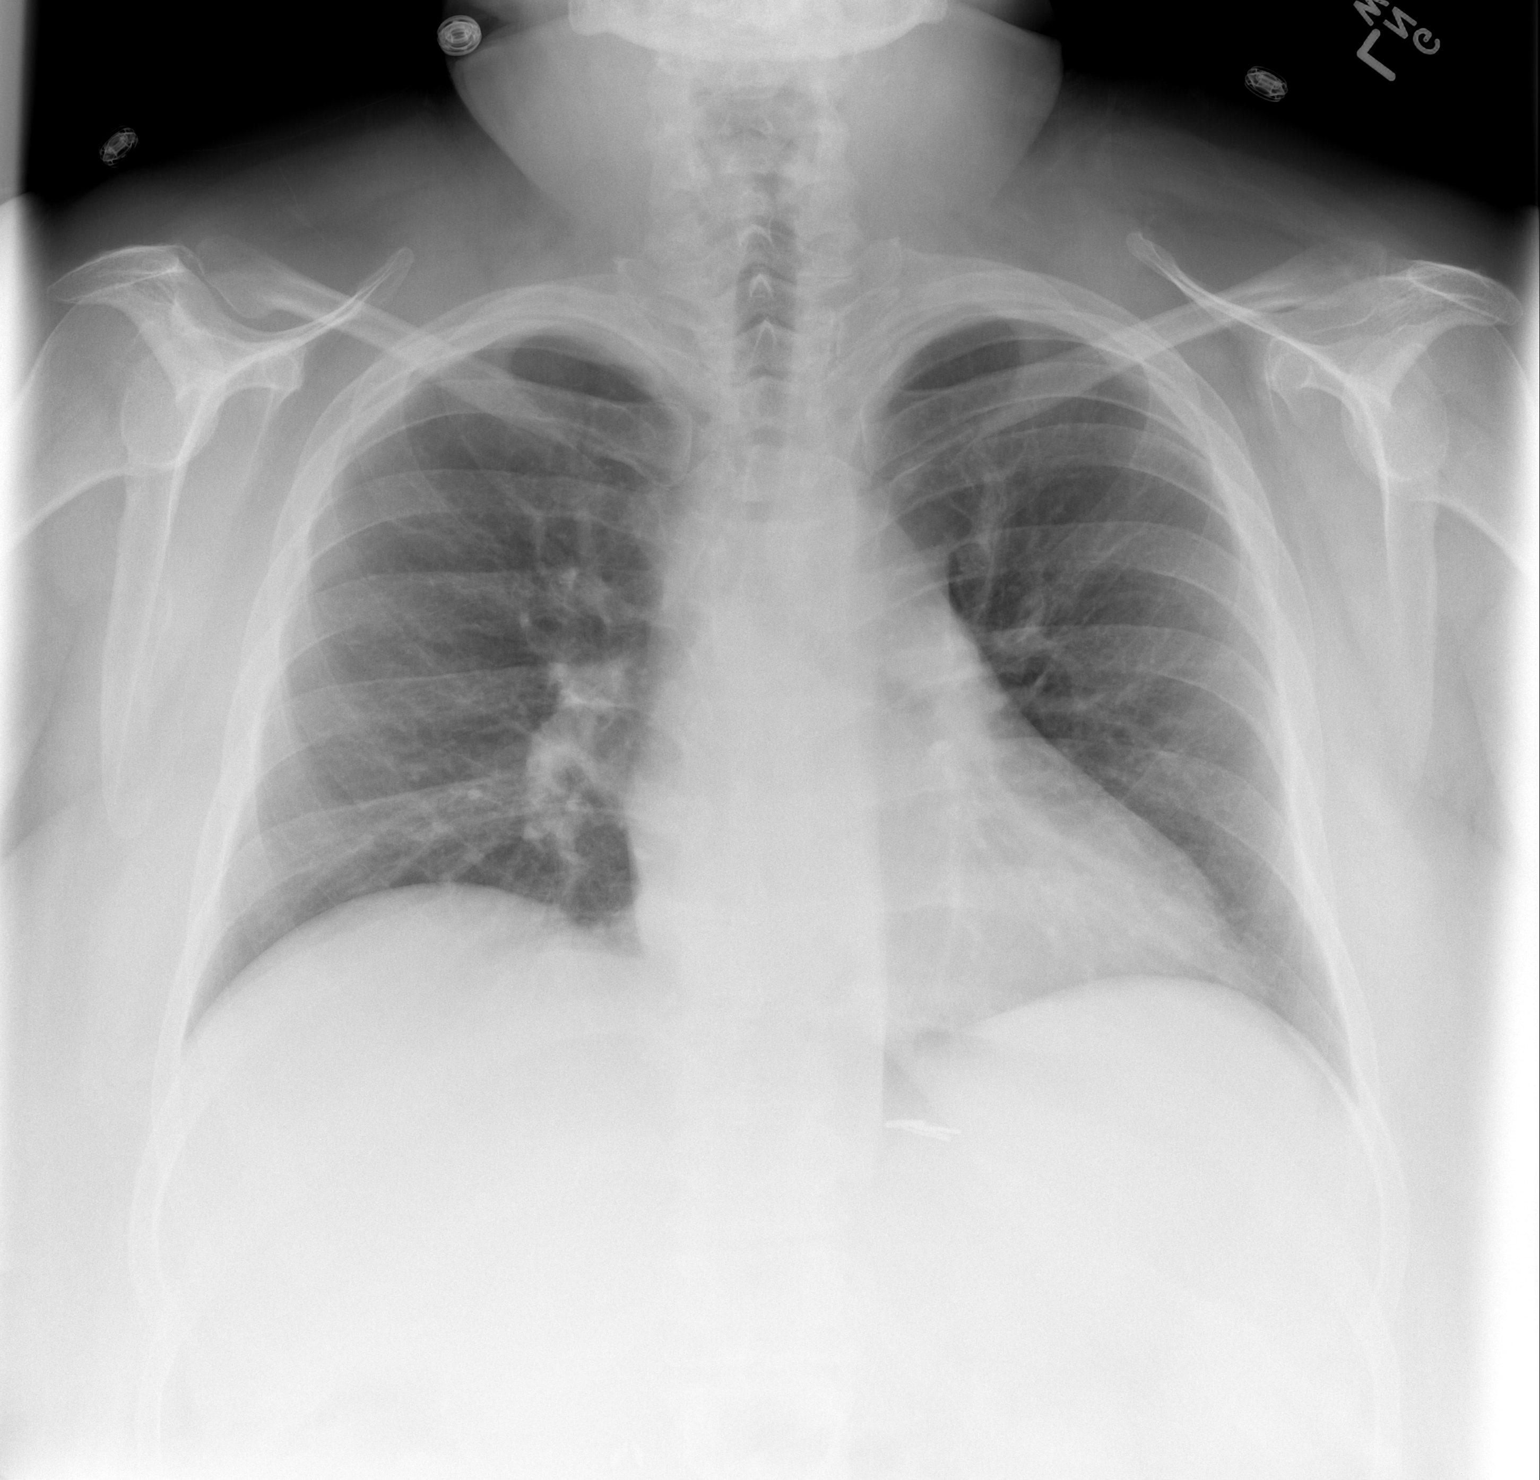

[w chest lat *]
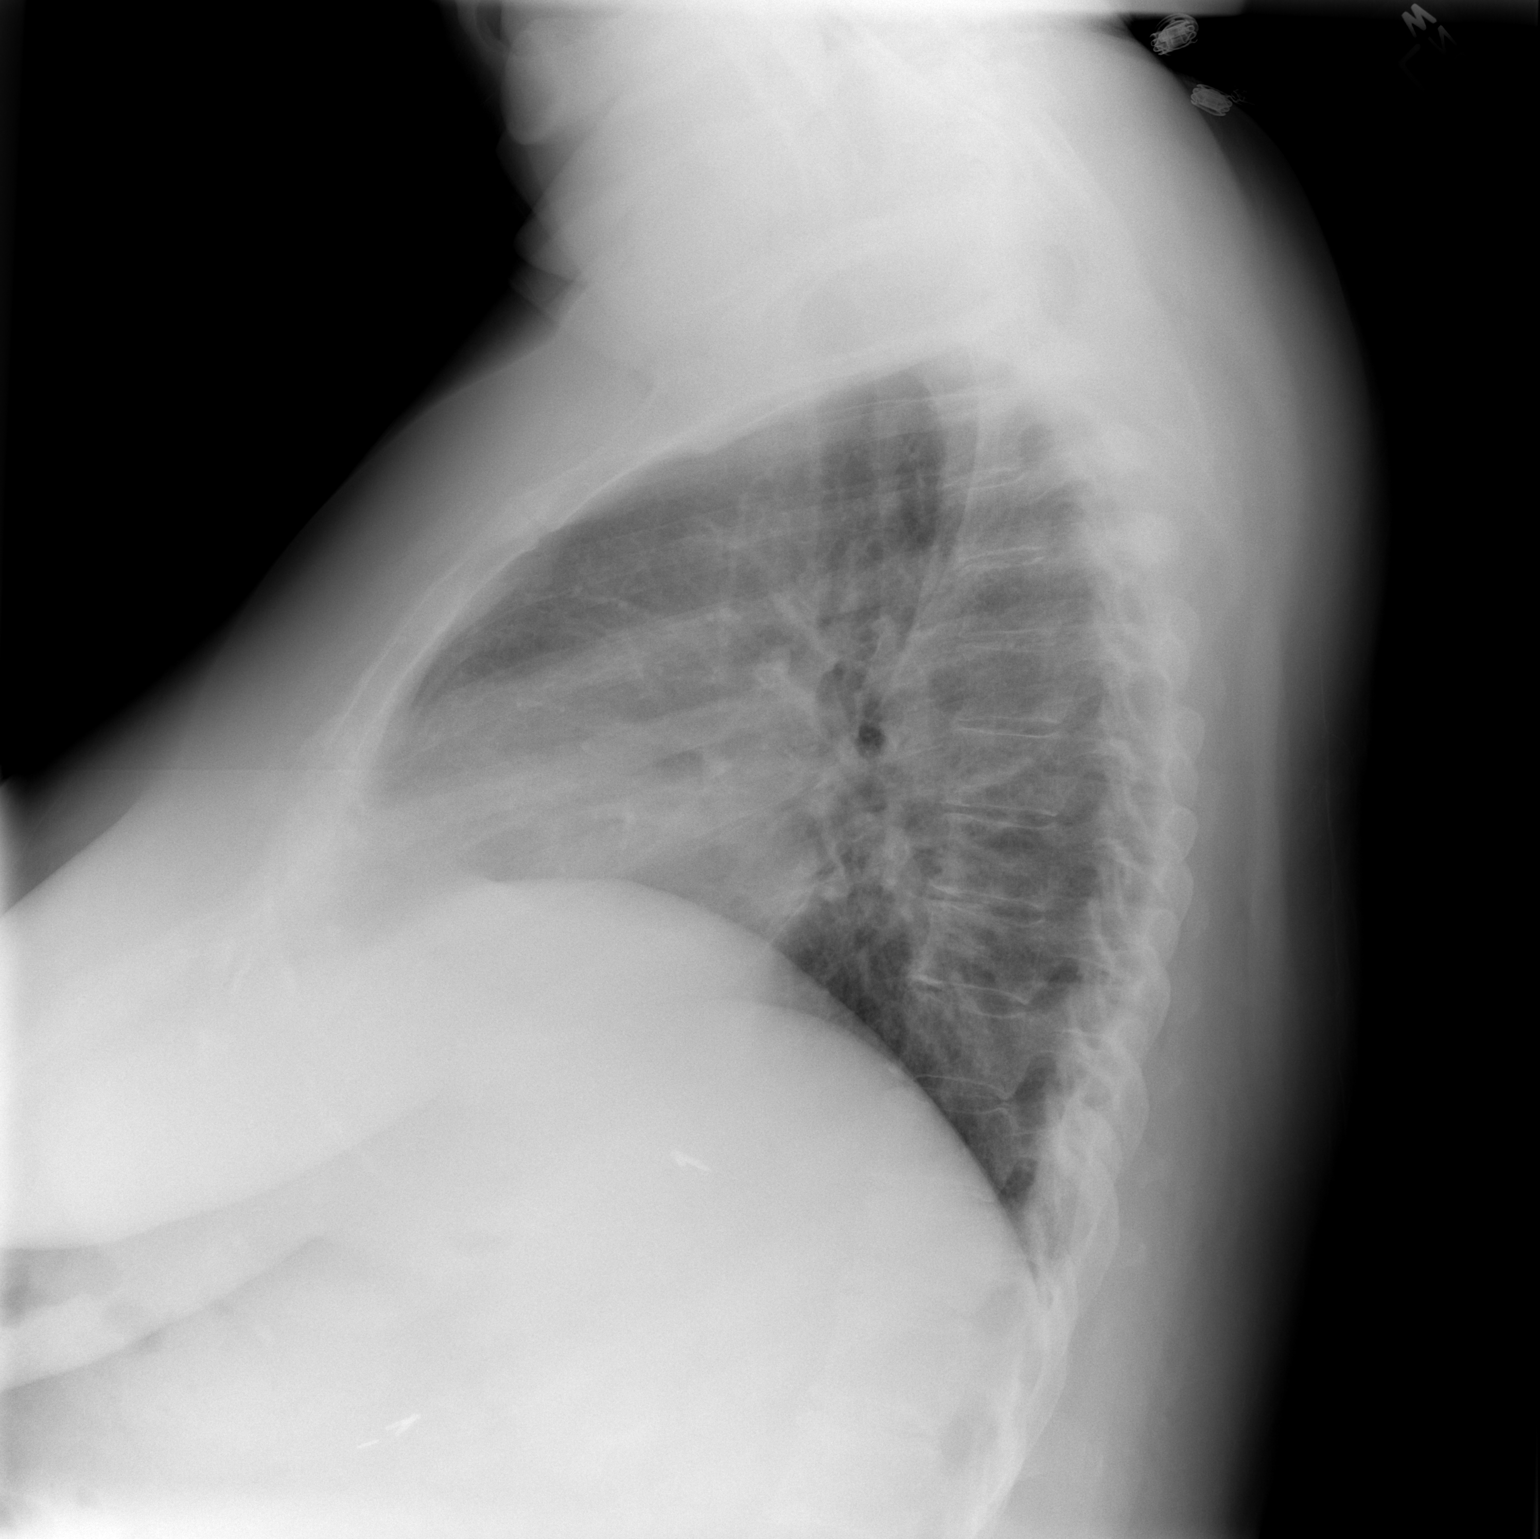

[2 of 2 positions shown; findings below may reference images not displayed]

FINDINGS: Epigastric clips are noted. Cardiomediastinal silhouette
is within normal limits. The lungs are clear. No pleural effusion.
No pneumothorax.  No acute osseous abnormality.  Lungs are
hypoaerated.
IMPRESSION: No acute cardiopulmonary process.

## 2013-12-14 NOTE — Telephone Encounter (Signed)
Pt would like a cb w/ lab results.

## 2013-12-14 NOTE — Telephone Encounter (Signed)
See result note.  

## 2013-12-18 ENCOUNTER — Encounter (HOSPITAL_COMMUNITY): Payer: BC Managed Care – PPO

## 2013-12-19 ENCOUNTER — Telehealth: Payer: Self-pay | Admitting: Internal Medicine

## 2013-12-19 NOTE — Telephone Encounter (Signed)
No samples of Lantus, husband aware

## 2013-12-19 NOTE — Telephone Encounter (Signed)
[name] being referred to endocrinology next week, however she will be out of Lantus Insulin.  She is requesting samples.

## 2013-12-20 ENCOUNTER — Encounter (HOSPITAL_COMMUNITY): Payer: BC Managed Care – PPO

## 2013-12-22 IMAGING — CT CT ANGIO CHEST
2 of 7 series · 19 of 36 positions shown · IV contrast (omnipaque)
Comparison: 02/03/2013

CLINICAL DATA: Shortness of breath on exertion

CT ANGIOGRAPHY CHEST
TECHNIQUE: Multidetector CT imaging of the chest using the
standard protocol during bolus administration of intravenous
contrast. Multiplanar reconstructed images including MIPs were
obtained and reviewed to evaluate the vascular anatomy.
Contrast: 100mL OMNIPAQUE IOHEXOL 350 MG/ML SOLN

[Series 6: pulm embolism 1.0 b25f st · axial · 0.77mm/px · z∈[+850,+1115]mm · 18 of 295 slices shown]
[im 15/295  lung]
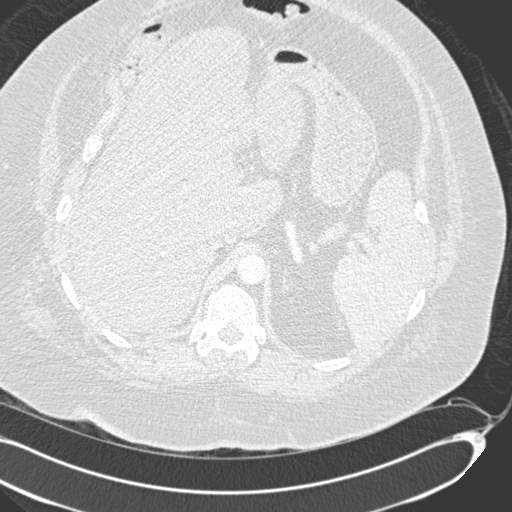
[im 30/295  mediastinal]
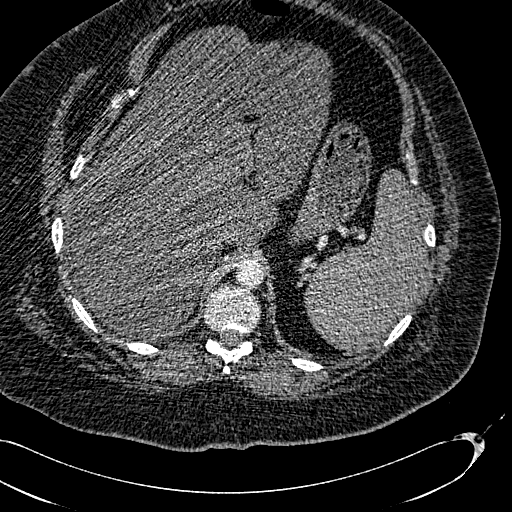
[im 45/295  lung]
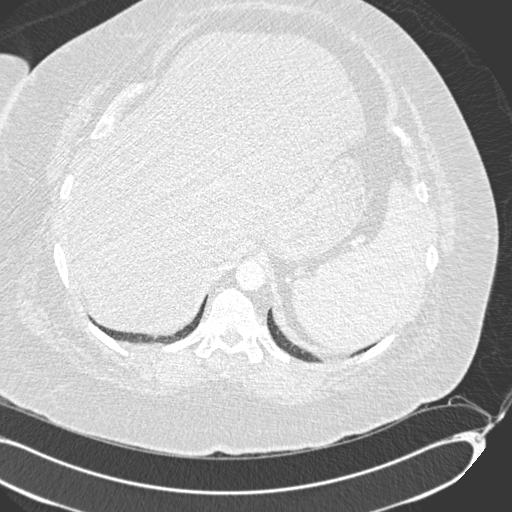
[im 59/295  mediastinal]
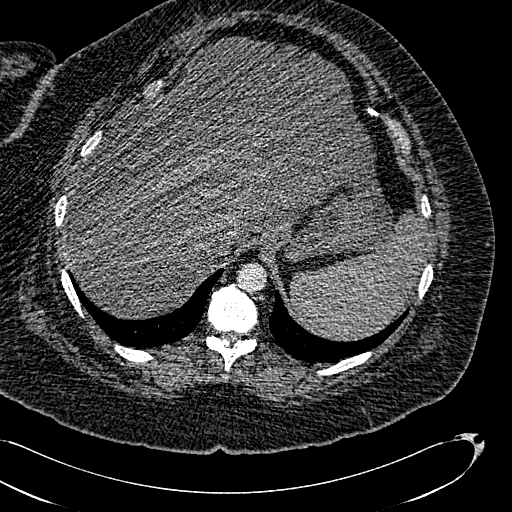
[im 74/295  lung]
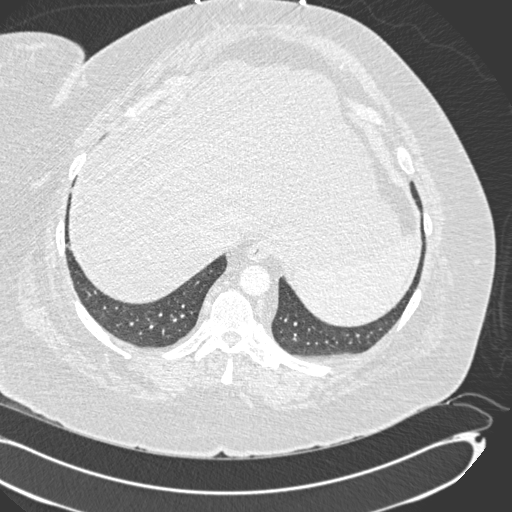
[im 89/295  mediastinal]
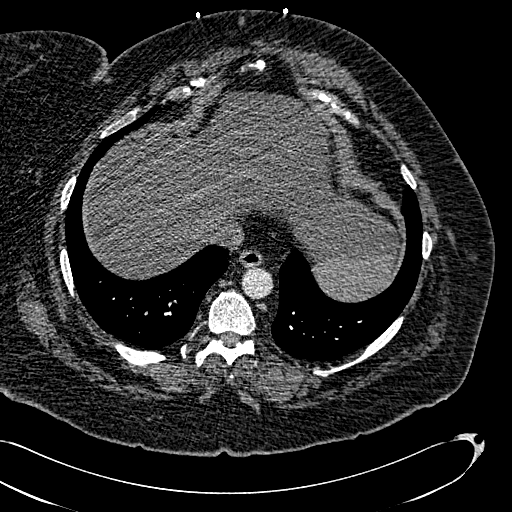
[im 103/295  lung]
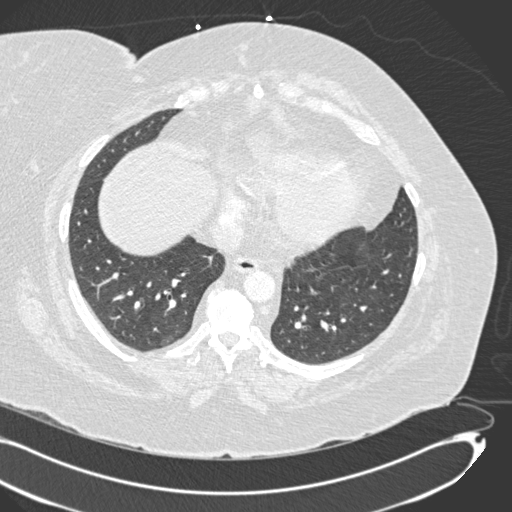
[im 118/295  mediastinal]
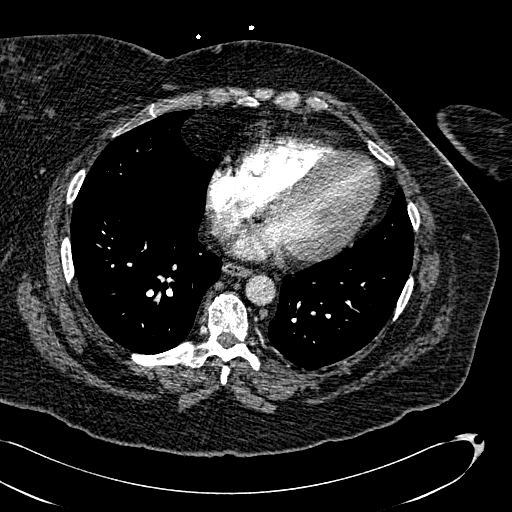
[im 133/295  lung]
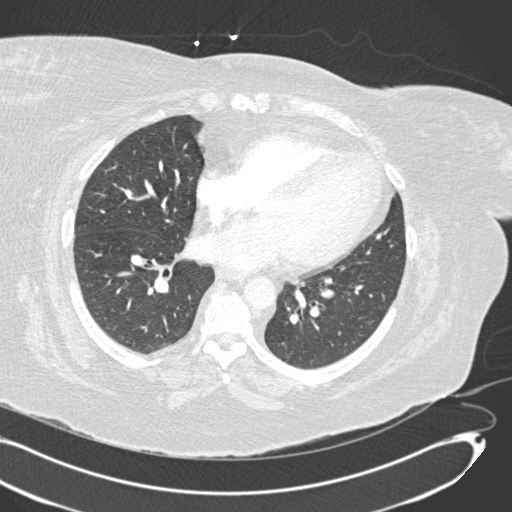
[im 162/295  mediastinal]
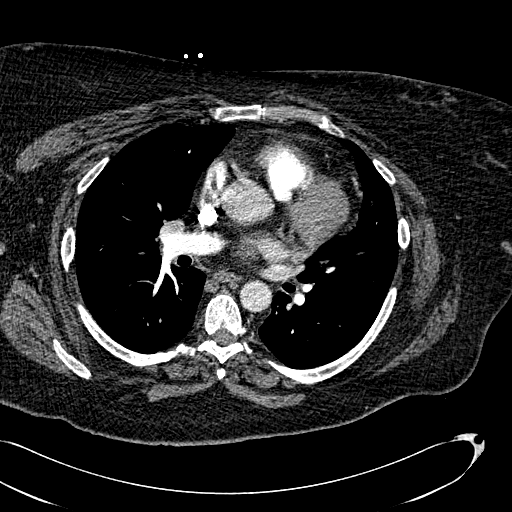
[im 177/295  lung]
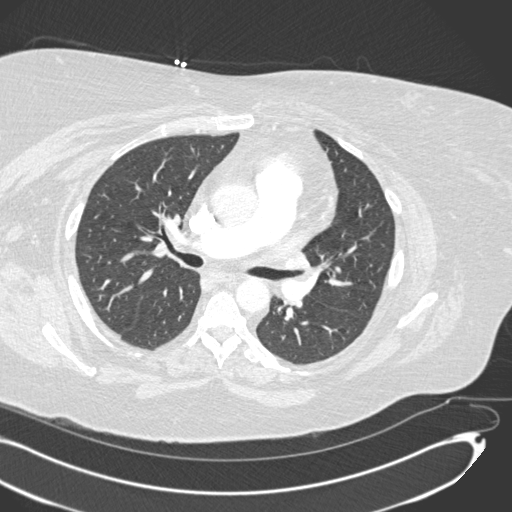
[im 192/295  mediastinal]
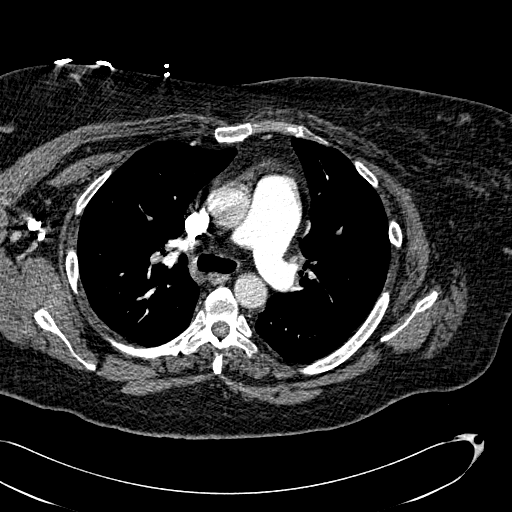
[im 206/295  lung]
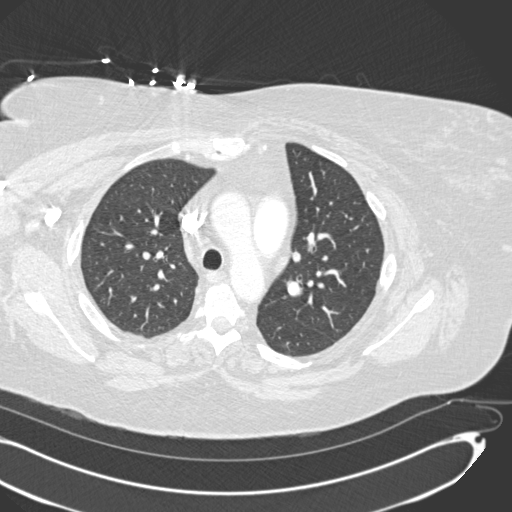
[im 221/295  mediastinal]
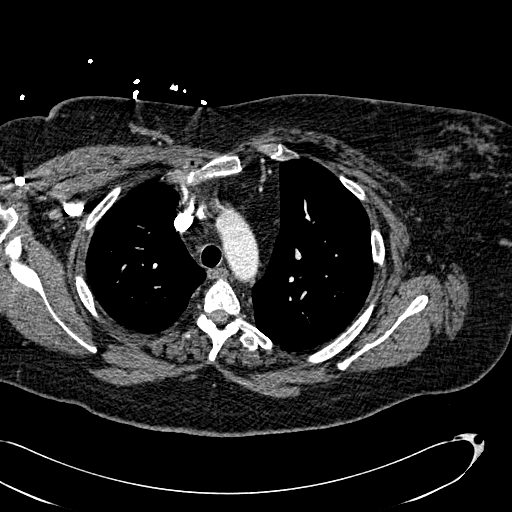
[im 236/295  lung]
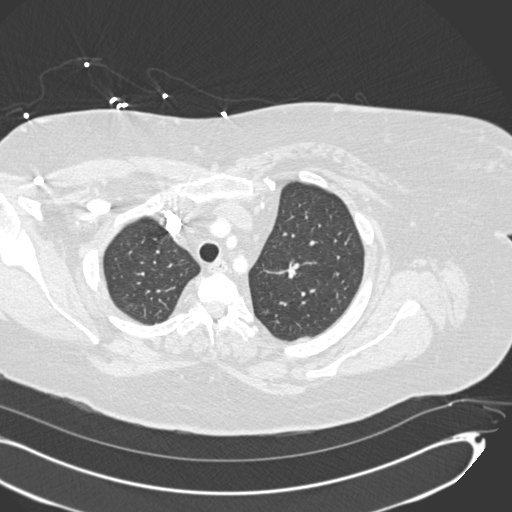
[im 250/295  mediastinal]
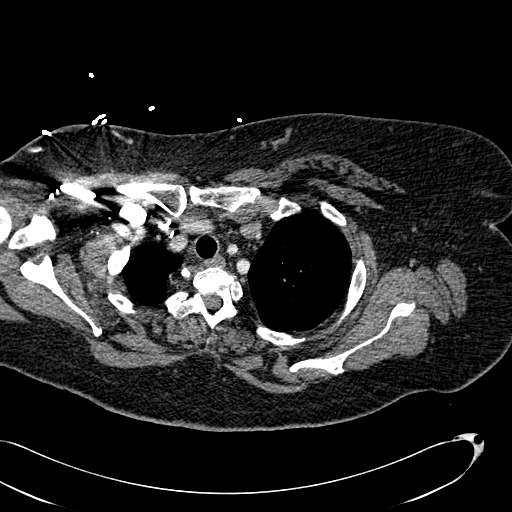
[im 265/295  lung]
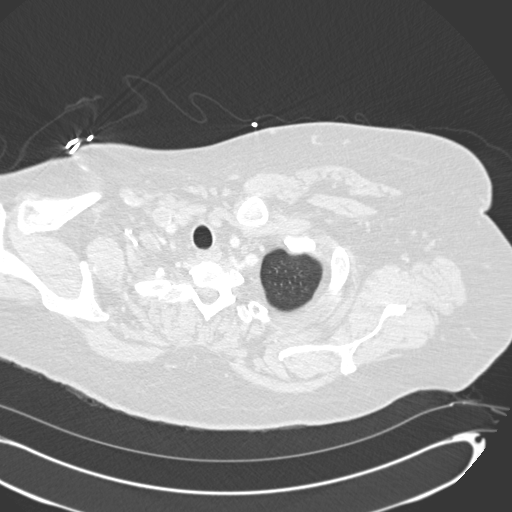
[im 280/295  mediastinal]
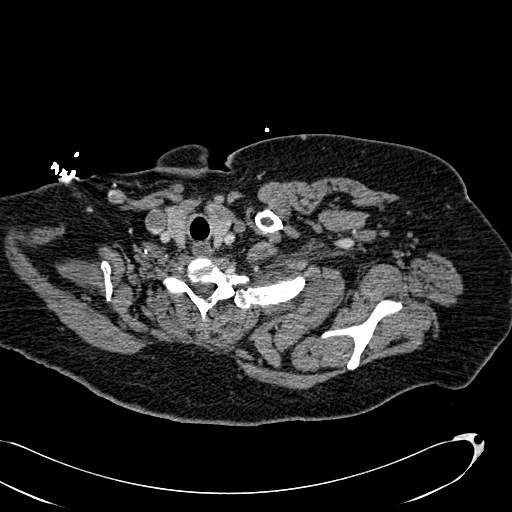

[Series 602: cor · coronal · 0.77mm/px · 1 of 122 slices shown]
[im 61/122  mediastinal]
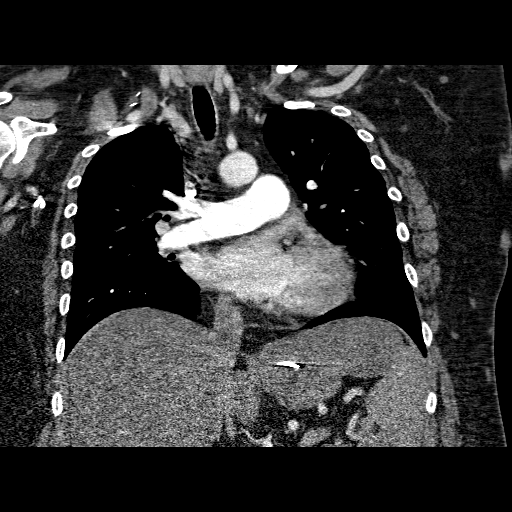

[19 of 36 positions shown; findings below may reference images not displayed]

FINDINGS: Enlarged main pulmonary artery, measuring 3.4 cm.  The
pulmonary arterial branch vessels are patent.

Normal caliber aorta.

Mild cardiomegaly.  Coronary artery calcification.

No pleural or pericardial effusion.

Postoperative changes at the GE junction.  Hepatic steatosis.

No intrathoracic lymphadenopathy.

Central airways are patent.  No pneumothorax.  No confluent
airspace opacity.  5 mm right upper lobe nodular density on series
5 image 44.

No acute osseous finding.
IMPRESSION: No pulmonary embolism or acute intrathoracic process.

5 mm right upper lobe nodular density. If the patient is at high
risk for bronchogenic carcinoma, follow-up chest CT at 6-12 months
is recommended.  If the patient is at low risk for bronchogenic
carcinoma, follow-up chest CT at 12 months is recommended.  This
recommendation follows the consensus statement: Guidelines for
Management of Small Pulmonary Nodules Detected on CT Scans: A
Statement from the [HOSPITAL] as published in Radiology
1669; [DATE].

Cardiomegaly with coronary artery calcification.

Hepatic steatosis.

## 2013-12-25 ENCOUNTER — Encounter (HOSPITAL_COMMUNITY): Payer: BC Managed Care – PPO

## 2013-12-25 DIAGNOSIS — S92309A Fracture of unspecified metatarsal bone(s), unspecified foot, initial encounter for closed fracture: Secondary | ICD-10-CM | POA: Diagnosis not present

## 2013-12-26 ENCOUNTER — Encounter: Payer: Self-pay | Admitting: Family

## 2013-12-26 ENCOUNTER — Ambulatory Visit (INDEPENDENT_AMBULATORY_CARE_PROVIDER_SITE_OTHER): Payer: BC Managed Care – PPO | Admitting: Family

## 2013-12-26 VITALS — BP 126/60 | HR 91 | Temp 97.5°F | Wt 274.0 lb

## 2013-12-26 DIAGNOSIS — B372 Candidiasis of skin and nail: Secondary | ICD-10-CM

## 2013-12-26 DIAGNOSIS — E1165 Type 2 diabetes mellitus with hyperglycemia: Secondary | ICD-10-CM

## 2013-12-26 DIAGNOSIS — IMO0001 Reserved for inherently not codable concepts without codable children: Secondary | ICD-10-CM

## 2013-12-26 DIAGNOSIS — J019 Acute sinusitis, unspecified: Secondary | ICD-10-CM

## 2013-12-26 MED ORDER — AMOXICILLIN 500 MG PO TABS: 1000.0000 mg | ORAL_TABLET | Freq: Two times a day (BID) | ORAL | Status: AC

## 2013-12-26 MED ORDER — FLUTICASONE PROPIONATE 50 MCG/ACT NA SUSP: 2.0000 | Freq: Every day | NASAL | Status: DC

## 2013-12-26 MED ORDER — NYSTATIN-TRIAMCINOLONE 100000-0.1 UNIT/GM-% EX OINT: 1.0000 "application " | TOPICAL_OINTMENT | Freq: Two times a day (BID) | CUTANEOUS | Status: DC

## 2013-12-26 NOTE — Patient Instructions (Signed)

## 2013-12-26 NOTE — Progress Notes (Signed)
Pre visit review using our clinic review tool, if applicable. No additional management support is needed unless otherwise documented below in the visit note. 

## 2013-12-26 NOTE — Progress Notes (Signed)
Subjective:    Patient ID: [name], female    DOB: Feb 15, 1961, 53 y.o.   MRN: [id number]  HPI 53 year old white female history of type 2 diabetes-uncontrolled is in today with complaints of sneezing, cough, congestion, sinus pressure and pain x2 weeks then worsening. She's been taking over-the-counter Tylenol and ibuprofen without much relief. Denies any muscle aches or pain. And    Review of Systems  Constitutional: Negative.   HENT: Positive for congestion, sinus pressure, sneezing and sore throat.   Respiratory: Positive for cough. Negative for wheezing.   Endocrine: Negative.   Musculoskeletal: Negative.   Skin: Positive for rash.       Red, itchy, rash under the breast.   Neurological: Negative.   Psychiatric/Behavioral: Negative.    Past Medical History  Diagnosis Date  . DIABETES MELLITUS, TYPE II 08/21/2006  . HYPERLIPIDEMIA 08/21/2006  . GOUT 08/20/2010  . OBESITY 06/04/2009  . ANEMIA-UNSPECIFIED 09/18/2009  . HYPERTENSION 08/21/2006  . GERD 08/21/2006  . SLEEP APNEA 04/21/2009    last testing was negative  . Internal hemorrhoids   . Pulmonary sarcoidosis     Followed locally by pulmonology, but also by Dr. Casper Harrison at [facility]  . Exertional chest pain     sharp, substernal, exertional  . Vocal cord dysfunction   . Chronic diastolic heart failure, NYHA class 2     LVEDP roughly 20% by cath  . CHF (congestive heart failure)     History   Social History  . Marital Status: Married    Spouse Name: JACKI COUSE    Number of Children: 2  . Years of Education: 12   Occupational History  . DISABLED    Social History Main Topics  . Smoking status: Never Smoker   . Smokeless tobacco: Never Used  . Alcohol Use: No  . Drug Use: No  . Sexual Activity: Yes   Other Topics Concern  . Not on file   Social History Narrative  . No narrative on file    Past Surgical History  Procedure Laterality Date  . Ventral hernia repair    .  Nissen fundoplication  2440  . Cholecystectomy  1984  . Abdominal hysterectomy    . Knee arthroscopy      right  . Tubal ligation      with reversal in 1994  . Bladder suspension  11/11/2011    Procedure: TRANSVAGINAL TAPE (TVT) PROCEDURE;  Surgeon: Olga Millers, MD;  Location: [facility];  Service: Gynecology;  Laterality: N/A;  . Cystoscopy  11/11/2011    Procedure: CYSTOSCOPY;  Surgeon: Olga Millers, MD;  Location: [facility];  Service: Gynecology;  Laterality: N/A;  . Doppler echocardiography  02/12/2013    LV FUNCTION, SIZE NORMAL; MILD CONCENTRIC LVH; EST EF 55-65%; WALL MOTION NORMAL  . Lexiscan myoview  03/09/2013    EF 50%; NORMAL MYOCARDIAL PERFUSION STUDY - breast attenuation  . Cardiac catheterization  07/2010    LVEF 50-55% WITH VERY MILD GLOBAL HYPOKINESIA; ESSENTIALLY NORMAL CORONARY ARTERIES; NORMAL LV FUNCTION  . Carotids  02/18/11    CAROTID DUPLEX; VERTEBRALS ARE PATENT WITH ANTEGRADE FLOW. ICA/CCA RATIO 1.61 ON RIGHT AND 0.75 ON LEFT  . Right and left cardiac catheterization  04/23/2013    Angiographic normal coronaries; LVEDP 20 mmHg, PCWP 12-14 mmHg, RAP 12 mmHg.; Fick CO/CI 4.9/2.2    Family History  Problem Relation Age of Onset  . Diabetes Father   . Heart attack Father   .  Coronary artery disease Father   . Heart failure Father   . COPD Mother   . Emphysema Mother   . Asthma Mother   . Heart failure Mother   . Sarcoidosis Maternal Uncle   . Colon cancer Neg Hx   . Lung cancer Brother   . Cancer Brother   . Diabetes Brother   . Heart attack Maternal Grandfather     Allergies  Allergen Reactions  . Lisinopril Cough  . Methotrexate     REACTION: peri-oral and buccal lesions.  . Clindamycin/Lincomycin Nausea And Vomiting and Rash    Current Outpatient Prescriptions on File Prior to Visit  Medication Sig Dispense Refill  . albuterol (PROVENTIL HFA;VENTOLIN HFA) 108 (90 BASE) MCG/ACT inhaler Inhale 2 puffs into the lungs every 6 (six) hours as needed.  For wheezing.      [name] albuterol (PROVENTIL) (5 MG/ML) 0.5% nebulizer solution Take 2.5 mg by nebulization every 6 (six) hours as needed. For wheezing.      [name] allopurinol (ZYLOPRIM) 100 MG tablet Take 100 mg by mouth at bedtime.       [name] aspirin EC 81 MG tablet Take 81 mg by mouth daily.       [name] atorvastatin (LIPITOR) 40 MG tablet Take 40 mg by mouth at bedtime.      . B-D ULTRAFINE III SHORT PEN 31G X 8 MM MISC Follow directions provided by physician  30 each  2  . benzonatate (TESSALON) 200 MG capsule Take 1 capsule (200 mg total) by mouth 3 (three) times daily.  20 capsule  0  . buPROPion (WELLBUTRIN XL) 300 MG 24 hr tablet Take 300 mg by mouth daily.      . carvedilol (COREG) 25 MG tablet Take 1 tablet (25 mg total) by mouth 2 (two) times daily with a meal.  180 tablet  3  . chlorpheniramine-HYDROcodone (TUSSIONEX) 10-8 MG/5ML LQCR Take 5 mLs by mouth every 12 (twelve) hours.  115 mL  0  . cholecalciferol (VITAMIN D) 1000 UNITS tablet Take 1,000 Units by mouth 2 (two) times daily.      . ciclesonide (OMNARIS) 50 MCG/ACT nasal spray Place 1 spray into both nostrils daily.       [name] DEXILANT 60 MG capsule Take one capsule by mouth one time daily  30 capsule  5  . dextromethorphan-guaiFENesin (MUCINEX DM) 30-600 MG per 12 hr tablet Take 1 tablet by mouth 2 (two) times daily.  60 tablet  0  . diltiazem (CARDIZEM CD) 120 MG 24 hr capsule Take 1 capsule (120 mg total) by mouth every evening.  30 capsule  11  . estradiol (ESTRACE) 2 MG tablet Take 2 mg by mouth at bedtime.       . fenofibrate (TRICOR) 145 MG tablet Take one tablet by mouth one time daily  30 tablet  10  . furosemide (LASIX) 40 MG tablet Take 1 tablet (40 mg total) by mouth 2 (two) times daily. Take additional 40 mg in AM of 7/15 & 7/16 - to shoot for dry wgt of ~2 lb below current weight.  45 tablet  12  . glucose blood (ONE TOUCH ULTRA TEST) test strip 1 each by Other route 3 (three) times daily. Use as instructed  100 each  5  .  glyBURIDE (DIABETA) 2.5 MG tablet TAKE 1 TABLET BY MOUTH DAILY WITH BREAKFAST.  30 tablet  2  . insulin glargine (LANTUS) 100 UNIT/ML injection Inject 23 Units into the skin at bedtime.      [name]  levofloxacin (LEVAQUIN) 500 MG tablet Take 1 tablet (500 mg total) by mouth daily.  3 tablet  0  . LORazepam (ATIVAN) 0.5 MG tablet Take 1 tablet (0.5 mg total) by mouth 2 (two) times daily as needed for anxiety.  30 tablet  1  . losartan (COZAAR) 100 MG tablet Take 100 mg by mouth at bedtime.      . metFORMIN (GLUCOPHAGE) 500 MG tablet Take 500 mg by mouth 2 (two) times daily with a meal.      . mometasone-formoterol (DULERA) 200-5 MCG/ACT AERO Inhale 2 puffs into the lungs 2 (two) times daily.  1 Inhaler  6  . nitroGLYCERIN (NITROSTAT) 0.4 MG SL tablet Place 0.4 mg under the tongue every 5 (five) minutes as needed. x3 doses as needed for chest pain.      [name] omeprazole (PRILOSEC) 20 MG capsule Take 1 capsule (20 mg total) by mouth 2 (two) times daily before a meal.  90 capsule  1  . PARoxetine Mesylate (BRISDELLE) 7.5 MG CAPS Take 7.5 mg by mouth daily.      . potassium chloride SA (K-DUR,KLOR-CON) 20 MEQ tablet Take 30 mEq by mouth 2 (two) times daily.      . predniSONE (DELTASONE) 5 MG tablet Take 10 mg by mouth every morning.       . sucralfate (CARAFATE) 1 GM/10ML suspension Take 10 mLs (1 g total) by mouth 4 (four) times daily -  with meals and at bedtime.  420 mL  0  . venlafaxine XR (EFFEXOR-XR) 75 MG 24 hr capsule Take 150 mg by mouth at bedtime.       No current facility-administered medications on file prior to visit.    BP 126/60  Pulse 91  Temp(Src) 97.5 F (36.4 C) (Oral)  Wt 274 lb (124.286 kg)  SpO2 98%chart    Objective:   Physical Exam  Constitutional: She is oriented to person, place, and time. She appears well-developed and well-nourished.  HENT:  Right Ear: External ear normal.  Left Ear: External ear normal.  Nose: Nose normal.  Mouth/Throat: Oropharynx is clear and moist.    Neck: Neck supple.  Cardiovascular: Normal rate, regular rhythm and normal heart sounds.   Pulmonary/Chest: Effort normal and breath sounds normal.  Musculoskeletal: Normal range of motion.  Neurological: She is alert and oriented to person, place, and time.  Skin: Skin is dry. Rash noted. There is erythema.  Psychiatric: She has a normal mood and affect.          Assessment & Plan:  [name] was seen today for sinusitis.  Diagnoses and associated orders for this visit:  Acute sinusitis  Candidal skin infection  Type II or unspecified type diabetes mellitus without mention of complication, uncontrolled  Other Orders - amoxicillin (AMOXIL) 500 MG tablet; Take 2 tablets (1,000 mg total) by mouth 2 (two) times daily. - fluticasone (FLONASE) 50 MCG/ACT nasal spray; Place 2 sprays into both nostrils daily. - nystatin-triamcinolone ointment (MYCOLOG); Apply 1 application topically 2 (two) times daily.   Call the office with any questions or concerns.

## 2013-12-27 ENCOUNTER — Ambulatory Visit (INDEPENDENT_AMBULATORY_CARE_PROVIDER_SITE_OTHER): Payer: BC Managed Care – PPO | Admitting: Internal Medicine

## 2013-12-27 ENCOUNTER — Encounter (HOSPITAL_COMMUNITY): Payer: BC Managed Care – PPO

## 2013-12-27 ENCOUNTER — Encounter: Payer: Self-pay | Admitting: Internal Medicine

## 2013-12-27 VITALS — BP 126/74 | HR 94 | Temp 98.5°F | Resp 12 | Ht 67.0 in | Wt 263.0 lb

## 2013-12-27 DIAGNOSIS — IMO0002 Reserved for concepts with insufficient information to code with codable children: Secondary | ICD-10-CM

## 2013-12-27 DIAGNOSIS — E1149 Type 2 diabetes mellitus with other diabetic neurological complication: Secondary | ICD-10-CM | POA: Diagnosis not present

## 2013-12-27 DIAGNOSIS — E1165 Type 2 diabetes mellitus with hyperglycemia: Secondary | ICD-10-CM

## 2013-12-27 MED ORDER — GLIPIZIDE ER 10 MG PO TB24: 10.0000 mg | ORAL_TABLET | Freq: Every day | ORAL | Status: DC

## 2013-12-27 MED ORDER — METFORMIN HCL 1000 MG PO TABS: 1000.0000 mg | ORAL_TABLET | Freq: Two times a day (BID) | ORAL | Status: DC

## 2013-12-27 MED ORDER — FREESTYLE FREEDOM LITE W/DEVICE KIT: PACK | Status: DC

## 2013-12-27 NOTE — Patient Instructions (Addendum)
Increase Metformin to 1000 mg 2x a day, with meals. Stop Glyburide. Start Glipizide XL 10 mg daily in am, right before breakfast. Please start a B complex vitamin daily. Please return in 1 month with your sugar log.    PATIENT INSTRUCTIONS FOR TYPE 2 DIABETES:  DIET AND EXERCISE Diet and exercise is an important part of diabetic treatment.  We recommended aerobic exercise in the form of brisk walking (working between 40-60% of maximal aerobic capacity, similar to brisk walking) for 150 minutes per week (such as 30 minutes five days per week) along with 3 times per week performing 'resistance' training (using various gauge rubber tubes with handles) 5-10 exercises involving the major muscle groups (upper body, lower body and core) performing 10-15 repetitions (or near fatigue) each exercise. Start at half the above goal but build slowly to reach the above goals. If limited by weight, joint pain, or disability, we recommend daily walking in a swimming pool with water up to waist to reduce pressure from joints while allow for adequate exercise.    BLOOD GLUCOSES Monitoring your blood glucoses is important for continued management of your diabetes. Please check your blood glucoses 2-4 times a day: fasting, before meals and at bedtime (you can rotate these measurements - e.g. one day check before the 3 meals, the next day check before 2 of the meals and before bedtime, etc.   HYPOGLYCEMIA (low blood sugar) Hypoglycemia is usually a reaction to not eating, exercising, or taking too much insulin/ other diabetes drugs.  Symptoms include tremors, sweating, hunger, confusion, headache, etc. Treat IMMEDIATELY with 15 grams of Carbs:   4 glucose tablets    cup regular juice/soda   2 tablespoons raisins   4 teaspoons sugar   1 tablespoon honey Recheck blood glucose in 15 mins and repeat above if still symptomatic/blood glucose <100. Please contact our office at [phone] if you have questions about  how to next handle your insulin.  RECOMMENDATIONS TO REDUCE YOUR RISK OF DIABETIC COMPLICATIONS: * Take your prescribed MEDICATION(S). * Follow a DIABETIC diet: Complex carbs, fiber rich foods, heart healthy fish twice weekly, (monounsaturated and polyunsaturated) fats * AVOID saturated/trans fats, high fat foods, >2,300 mg salt per day. * EXERCISE at least 5 times a week for 30 minutes or preferably daily.  * DO NOT SMOKE OR DRINK more than 1 drink a day. * Check your FEET every day. Do not wear tightfitting shoes. Contact us if you develop an ulcer * See your EYE doctor once a year or more if needed * Get a FLU shot once a year * Get a PNEUMONIA vaccine once before and once after age 68 years  GOALS:  * Your Hemoglobin A1c of <7%  * fasting sugars need to be <130 * after meals sugars need to be <180 (2h after you start eating) * Your Systolic BP should be 865 or lower  * Your Diastolic BP should be 80 or lower  * Your HDL (Good Cholesterol) should be 40 or higher  * Your LDL (Bad Cholesterol) should be 100 or lower  * Your Triglycerides should be 150 or lower  * Your Urine microalbumin (kidney function) should be <30 * Your Body Mass Index should be 25 or lower   We will be glad to help you achieve these goals. Our telephone number is: [phone].

## 2013-12-27 NOTE — Progress Notes (Signed)
Patient ID: [name], female   DOB: [birth year], 53 y.o.   MRN: [id number]  HPI: [name] is a 53 y.o.-year-old female, referred by her PCP, Dr.Swords, for management of DM2, insulin-dependent, uncontrolled, with complications (CHF stage 2, peripheral neuropathy).  Patient has been diagnosed with diabetes in ~2007; she started insulin. Last hemoglobin A1c was: Lab Results  Component Value Date   HGBA1C 8.7* 12/10/2013   HGBA1C 7.9* 07/24/2013   HGBA1C 7.2* 03/06/2013   She is on Prednisone 10 mg for sarcoidosis. Last increase was to 60 mg ~1 mo ago.  [name] on a regimen of: - Metformin 500 mg po bid >> tolerates it well - Glyburide 2.5 mg in am - Lantus 23 units qhs - pen  Pt checks her sugars 0-1 a day and they are: - am: 130-260 - 2h after b'fast: n/c - before lunch: n/c - 2h after lunch: n/c - before dinner: n/c - 2h after dinner: n/c - bedtime: 200s - nighttime: n/c No lows. Lowest sugar was 130; she has hypoglycemia awareness at 70s.  Highest sugar was 300s >> blurry vision and dry mouth.   Meter: Freestyle Freedom Lite.   Pt's meals are: - Breakfast: egg + bacon or if go out: bisquit - Lunch: sandwich - Dinner: meat + veggies + starch - Snacks: pork skins and cookies - 1-2 a day; also after dinner No exercise.  - no CKD, last BUN/creatinine:  Lab Results  Component Value Date   BUN 20 12/10/2013   CREATININE 0.8 12/10/2013  On Losartan. - last set of lipids: Lab Results  Component Value Date   CHOL 211* 12/10/2013   HDL 44.80 12/10/2013   LDLCALC UNABLE TO CALCULATE IF TRIGLYCERIDE OVER 400 mg/dL 05/28/2011   LDLDIRECT 117.9 12/10/2013   TRIG 508.0* 12/10/2013   CHOLHDL 5 12/10/2013  On Lipitor. - last eye exam was in 03/2013 - sees eye dr once a year. No DR.  - + numbness and tingling in her feet and legs hurt. Se has restless legs.   Does have frequent urination and nocturia 2-3/night.   Pt has FH of DM in father and brother.  She also has gout, HL,  HTN, sarcoidosis of lung, GERD, COPD, OSA >> resolved.  ROS: Constitutional: no weight gain/loss, no fatigue, no subjective hyperthermia/hypothermia, + nocturia x 2-3 Eyes: no blurry vision, no xerophthalmia ENT: no sore throat, no nodules palpated in throat, no dysphagia/odynophagia, no hoarseness Cardiovascular: no CP/SOB/palpitations/+ leg swelling Respiratory: + cough/no SOB Gastrointestinal: no N/V/D/C, + heartburn Musculoskeletal: no muscle/joint aches Skin: no rashes Neurological: no tremors/numbness/tingling/dizziness Psychiatric: + both: depression/anxiety  Past Medical History  Diagnosis Date  . DIABETES MELLITUS, TYPE II 08/21/2006  . HYPERLIPIDEMIA 08/21/2006  . GOUT 08/20/2010  . OBESITY 06/04/2009  . ANEMIA-UNSPECIFIED 09/18/2009  . HYPERTENSION 08/21/2006  . GERD 08/21/2006  . SLEEP APNEA 04/21/2009    last testing was negative  . Internal hemorrhoids   . Pulmonary sarcoidosis     Followed locally by pulmonology, but also by Dr. Casper Harrison at [facility]  . Exertional chest pain     sharp, substernal, exertional  . Vocal cord dysfunction   . Chronic diastolic heart failure, NYHA class 2     LVEDP roughly 20% by cath  . CHF (congestive heart failure)    Past Surgical History  Procedure Laterality Date  . Ventral hernia repair    . Nissen fundoplication  7619  . Cholecystectomy  1984  . Abdominal hysterectomy    .  Knee arthroscopy      right  . Tubal ligation      with reversal in 1994  . Bladder suspension  11/11/2011    Procedure: TRANSVAGINAL TAPE (TVT) PROCEDURE;  Surgeon: Olga Millers, MD;  Location: [facility];  Service: Gynecology;  Laterality: N/A;  . Cystoscopy  11/11/2011    Procedure: CYSTOSCOPY;  Surgeon: Olga Millers, MD;  Location: [facility];  Service: Gynecology;  Laterality: N/A;  . Doppler echocardiography  02/12/2013    LV FUNCTION, SIZE NORMAL; MILD CONCENTRIC LVH; EST EF 55-65%; WALL MOTION NORMAL  . Lexiscan myoview  03/09/2013     EF 50%; NORMAL MYOCARDIAL PERFUSION STUDY - breast attenuation  . Cardiac catheterization  07/2010    LVEF 50-55% WITH VERY MILD GLOBAL HYPOKINESIA; ESSENTIALLY NORMAL CORONARY ARTERIES; NORMAL LV FUNCTION  . Carotids  02/18/11    CAROTID DUPLEX; VERTEBRALS ARE PATENT WITH ANTEGRADE FLOW. ICA/CCA RATIO 1.61 ON RIGHT AND 0.75 ON LEFT  . Right and left cardiac catheterization  04/23/2013    Angiographic normal coronaries; LVEDP 20 mmHg, PCWP 12-14 mmHg, RAP 12 mmHg.; Fick CO/CI 4.9/2.2   History   Social History  . Marital Status: Married    Spouse Name: ELLAN TESS    Number of Children: 2  . Years of Education: 12   Occupational History  . DISABLED    Social History Main Topics  . Smoking status: Never Smoker   . Smokeless tobacco: Never Used  . Alcohol Use: No  . Drug Use: No  . Sexual Activity: Yes   Current Outpatient Prescriptions on File Prior to Visit  Medication Sig Dispense Refill  . albuterol (PROVENTIL HFA;VENTOLIN HFA) 108 (90 BASE) MCG/ACT inhaler Inhale 2 puffs into the lungs every 6 (six) hours as needed. For wheezing.      [name] albuterol (PROVENTIL) (5 MG/ML) 0.5% nebulizer solution Take 2.5 mg by nebulization every 6 (six) hours as needed. For wheezing.      [name] allopurinol (ZYLOPRIM) 100 MG tablet Take 100 mg by mouth at bedtime.       [name] amoxicillin (AMOXIL) 500 MG tablet Take 2 tablets (1,000 mg total) by mouth 2 (two) times daily.  40 tablet  0  . aspirin EC 81 MG tablet Take 81 mg by mouth daily.       [name] atorvastatin (LIPITOR) 40 MG tablet Take 40 mg by mouth at bedtime.      . B-D ULTRAFINE III SHORT PEN 31G X 8 MM MISC Follow directions provided by physician  30 each  2  . benzonatate (TESSALON) 200 MG capsule Take 1 capsule (200 mg total) by mouth 3 (three) times daily.  20 capsule  0  . buPROPion (WELLBUTRIN XL) 300 MG 24 hr tablet Take 300 mg by mouth daily.      . carvedilol (COREG) 25 MG tablet Take 1 tablet (25 mg total) by mouth 2 (two) times daily  with a meal.  180 tablet  3  . chlorpheniramine-HYDROcodone (TUSSIONEX) 10-8 MG/5ML LQCR Take 5 mLs by mouth every 12 (twelve) hours.  115 mL  0  . cholecalciferol (VITAMIN D) 1000 UNITS tablet Take 1,000 Units by mouth 2 (two) times daily.      . ciclesonide (OMNARIS) 50 MCG/ACT nasal spray Place 1 spray into both nostrils daily.       [name] DEXILANT 60 MG capsule Take one capsule by mouth one time daily  30 capsule  5  . dextromethorphan-guaiFENesin (MUCINEX DM) 30-600 MG per 12  hr tablet Take 1 tablet by mouth 2 (two) times daily.  60 tablet  0  . diltiazem (CARDIZEM CD) 120 MG 24 hr capsule Take 1 capsule (120 mg total) by mouth every evening.  30 capsule  11  . estradiol (ESTRACE) 2 MG tablet Take 2 mg by mouth at bedtime.       . fenofibrate (TRICOR) 145 MG tablet Take one tablet by mouth one time daily  30 tablet  10  . fluticasone (FLONASE) 50 MCG/ACT nasal spray Place 2 sprays into both nostrils daily.  16 g  2  . furosemide (LASIX) 40 MG tablet Take 1 tablet (40 mg total) by mouth 2 (two) times daily. Take additional 40 mg in AM of 7/15 & 7/16 - to shoot for dry wgt of ~2 lb below current weight.  45 tablet  12  . glucose blood (ONE TOUCH ULTRA TEST) test strip 1 each by Other route 3 (three) times daily. Use as instructed  100 each  5  . insulin glargine (LANTUS) 100 UNIT/ML injection Inject 23 Units into the skin at bedtime.      [name] levofloxacin (LEVAQUIN) 500 MG tablet Take 1 tablet (500 mg total) by mouth daily.  3 tablet  0  . LORazepam (ATIVAN) 0.5 MG tablet Take 1 tablet (0.5 mg total) by mouth 2 (two) times daily as needed for anxiety.  30 tablet  1  . losartan (COZAAR) 100 MG tablet Take 100 mg by mouth at bedtime.      . mometasone-formoterol (DULERA) 200-5 MCG/ACT AERO Inhale 2 puffs into the lungs 2 (two) times daily.  1 Inhaler  6  . nitroGLYCERIN (NITROSTAT) 0.4 MG SL tablet Place 0.4 mg under the tongue every 5 (five) minutes as needed. x3 doses as needed for chest pain.      [name]  nystatin-triamcinolone ointment (MYCOLOG) Apply 1 application topically 2 (two) times daily.  30 g  0  . omeprazole (PRILOSEC) 20 MG capsule Take 1 capsule (20 mg total) by mouth 2 (two) times daily before a meal.  90 capsule  1  . PARoxetine Mesylate (BRISDELLE) 7.5 MG CAPS Take 7.5 mg by mouth daily.      . potassium chloride SA (K-DUR,KLOR-CON) 20 MEQ tablet Take 30 mEq by mouth 2 (two) times daily.      . predniSONE (DELTASONE) 5 MG tablet Take 10 mg by mouth every morning.       . sucralfate (CARAFATE) 1 GM/10ML suspension Take 10 mLs (1 g total) by mouth 4 (four) times daily -  with meals and at bedtime.  420 mL  0  . venlafaxine XR (EFFEXOR-XR) 75 MG 24 hr capsule Take 150 mg by mouth at bedtime.       No current facility-administered medications on file prior to visit.   Allergies  Allergen Reactions  . Lisinopril Cough  . Methotrexate     REACTION: peri-oral and buccal lesions.  . Clindamycin/Lincomycin Nausea And Vomiting and Rash   Family History  Problem Relation Age of Onset  . Diabetes Father   . Heart attack Father   . Coronary artery disease Father   . Heart failure Father   . COPD Mother   . Emphysema Mother   . Asthma Mother   . Heart failure Mother   . Sarcoidosis Maternal Uncle   . Colon cancer Neg Hx   . Lung cancer Brother   . Cancer Brother   . Diabetes Brother   . Heart attack Maternal  Grandfather    PE: BP 126/74  Pulse 94  Temp(Src) 98.5 F (36.9 C) (Oral)  Resp 12  Ht 5\' 7"  (1.702 m)  Wt 263 lb (119.296 kg)  BMI 41.18 kg/m2  SpO2 95% Wt Readings from Last 3 Encounters:  12/27/13 263 lb (119.296 kg)  12/26/13 274 lb (124.286 kg)  12/10/13 262 lb (118.842 kg)  ? Fluctuation in weight?   Constitutional: obese, in NAD Eyes: PERRLA, EOMI, no exophthalmos ENT: moist mucous membranes, no thyromegaly, no cervical lymphadenopathy Cardiovascular: RRR, No MRG Respiratory: CTA B Gastrointestinal: abdomen soft, NT, ND, BS+ Musculoskeletal: no  deformities, strength intact in all 4 Skin: moist, warm, no rashes Neurological: + mild tremor with outstretched hands, DTR 2/5 in all 4  ASSESSMENT: 1. DM2, insulin-dependent, uncontrolled, with complications - CHF stage 2 - peripheral neuropathy  PLAN:  1. Patient with long-standing, recently more uncontrolled diabetes, on oral antidiabetic regimen + Basal insulin, which became insufficient - We discussed about options for treatment, and I suggested to:  Patient Instructions  Increase Metformin to 1000 mg 2x a day, with meals. Stop Glyburide. Start Glipizide XL 10 mg daily in am, right before breakfast. Please start a B complex vitamin daily. Please return in 1 month with your sugar log.  - we can try NPH in am if sugars do not improve. The insulin profile of NPH matches the prednisone concentration in blood - I referred her to nutrition. I also advised her to reduce fat in her diet (see b'fast choices) and to include more fruit and veggies - Strongly advised her to start checking sugars at different times of the day - check 2 times a day, rotating checks - given sugar log and advised how to fill it and to bring it at next appt  - given foot care handout and explained the principles  - given instructions for hypoglycemia management "15-15 rule"  - advised for yearly eye exams - she is up to date - Return to clinic in 1 mo with sugar log

## 2014-01-01 ENCOUNTER — Encounter (HOSPITAL_COMMUNITY): Payer: BC Managed Care – PPO

## 2014-01-02 ENCOUNTER — Ambulatory Visit (INDEPENDENT_AMBULATORY_CARE_PROVIDER_SITE_OTHER): Payer: BC Managed Care – PPO | Admitting: Family Medicine

## 2014-01-02 DIAGNOSIS — T148XXA Other injury of unspecified body region, initial encounter: Secondary | ICD-10-CM | POA: Diagnosis not present

## 2014-01-02 NOTE — Patient Instructions (Signed)
Contusion °A contusion is a deep bruise. Contusions are the result of an injury that caused bleeding under the skin. The contusion may turn blue, purple, or yellow. Minor injuries will give you a painless contusion, but more severe contusions may stay painful and swollen for a few weeks.  °CAUSES  °A contusion is usually caused by a blow, trauma, or direct force to an area of the body. °SYMPTOMS  °· Swelling and redness of the injured area. °· Bruising of the injured area. °· Tenderness and soreness of the injured area. °· Pain. °DIAGNOSIS  °The diagnosis can be made by taking a history and physical exam. An X-ray, CT scan, or MRI may be needed to determine if there were any associated injuries, such as fractures. °TREATMENT  °Specific treatment will depend on what area of the body was injured. In general, the best treatment for a contusion is resting, icing, elevating, and applying cold compresses to the injured area. Over-the-counter medicines may also be recommended for pain control. Ask your caregiver what the best treatment is for your contusion. °HOME CARE INSTRUCTIONS  °· Put ice on the injured area. °· Put ice in a plastic bag. °· Place a towel between your skin and the bag. °· Leave the ice on for 15-20 minutes, 03-04 times a day. °· Only take over-the-counter or prescription medicines for pain, discomfort, or fever as directed by your caregiver. Your caregiver may recommend avoiding anti-inflammatory medicines (aspirin, ibuprofen, and naproxen) for 48 hours because these medicines may increase bruising. °· Rest the injured area. °· If possible, elevate the injured area to reduce swelling. °SEEK IMMEDIATE MEDICAL CARE IF:  °· You have increased bruising or swelling. °· You have pain that is getting worse. °· Your swelling or pain is not relieved with medicines. °MAKE SURE YOU:  °· Understand these instructions. °· Will watch your condition. °· Will get help right away if you are not doing well or get  worse. °Document Released: 07/07/2005 Document Revised: 12/20/2011 Document Reviewed: 08/02/2011 °[employer]® Patient Information ©2014 [employer]. ° °

## 2014-01-02 NOTE — Progress Notes (Signed)
No chief complaint on file.   HPI:  Acute visit for:  L side pain mild to moderate Tripped over stepping stone 4 days ago and landed on L side No bruising, breathing fine, no LOC, no head injury or pain elswhere Tumbled onto the grass  ROS: See pertinent positives and negatives per HPI.  Past Medical History  Diagnosis Date  . DIABETES MELLITUS, TYPE II 08/21/2006  . HYPERLIPIDEMIA 08/21/2006  . GOUT 08/20/2010  . OBESITY 06/04/2009  . ANEMIA-UNSPECIFIED 09/18/2009  . HYPERTENSION 08/21/2006  . GERD 08/21/2006  . SLEEP APNEA 04/21/2009    last testing was negative  . Internal hemorrhoids   . Pulmonary sarcoidosis     Followed locally by pulmonology, but also by Dr. Casper Harrison at [facility]  . Exertional chest pain     sharp, substernal, exertional  . Vocal cord dysfunction   . Chronic diastolic heart failure, NYHA class 2     LVEDP roughly 20% by cath  . CHF (congestive heart failure)     Past Surgical History  Procedure Laterality Date  . Ventral hernia repair    . Nissen fundoplication  9233  . Cholecystectomy  1984  . Abdominal hysterectomy    . Knee arthroscopy      right  . Tubal ligation      with reversal in 1994  . Bladder suspension  11/11/2011    Procedure: TRANSVAGINAL TAPE (TVT) PROCEDURE;  Surgeon: Olga Millers, MD;  Location: [facility];  Service: Gynecology;  Laterality: N/A;  . Cystoscopy  11/11/2011    Procedure: CYSTOSCOPY;  Surgeon: Olga Millers, MD;  Location: [facility];  Service: Gynecology;  Laterality: N/A;  . Doppler echocardiography  02/12/2013    LV FUNCTION, SIZE NORMAL; MILD CONCENTRIC LVH; EST EF 55-65%; WALL MOTION NORMAL  . Lexiscan myoview  03/09/2013    EF 50%; NORMAL MYOCARDIAL PERFUSION STUDY - breast attenuation  . Cardiac catheterization  07/2010    LVEF 50-55% WITH VERY MILD GLOBAL HYPOKINESIA; ESSENTIALLY NORMAL CORONARY ARTERIES; NORMAL LV FUNCTION  . Carotids  02/18/11    CAROTID DUPLEX; VERTEBRALS ARE PATENT WITH  ANTEGRADE FLOW. ICA/CCA RATIO 1.61 ON RIGHT AND 0.75 ON LEFT  . Right and left cardiac catheterization  04/23/2013    Angiographic normal coronaries; LVEDP 20 mmHg, PCWP 12-14 mmHg, RAP 12 mmHg.; Fick CO/CI 4.9/2.2    Family History  Problem Relation Age of Onset  . Diabetes Father   . Heart attack Father   . Coronary artery disease Father   . Heart failure Father   . COPD Mother   . Emphysema Mother   . Asthma Mother   . Heart failure Mother   . Sarcoidosis Maternal Uncle   . Colon cancer Neg Hx   . Lung cancer Brother   . Cancer Brother   . Diabetes Brother   . Heart attack Maternal Grandfather     History   Social History  . Marital Status: Married    Spouse Name: ADRYAN DRUCKENMILLER    Number of Children: 2  . Years of Education: 12   Occupational History  . DISABLED    Social History Main Topics  . Smoking status: Never Smoker   . Smokeless tobacco: Never Used  . Alcohol Use: No  . Drug Use: No  . Sexual Activity: Yes   Other Topics Concern  . Not on file   Social History Narrative  . No narrative on file    Current outpatient prescriptions:albuterol (PROVENTIL HFA;VENTOLIN  HFA) 108 (90 BASE) MCG/ACT inhaler, Inhale 2 puffs into the lungs every 6 (six) hours as needed. For wheezing., Disp: , Rfl: ;  albuterol (PROVENTIL) (5 MG/ML) 0.5% nebulizer solution, Take 2.5 mg by nebulization every 6 (six) hours as needed. For wheezing., Disp: , Rfl: ;  allopurinol (ZYLOPRIM) 100 MG tablet, Take 100 mg by mouth at bedtime. , Disp: , Rfl:  amoxicillin (AMOXIL) 500 MG tablet, Take 2 tablets (1,000 mg total) by mouth 2 (two) times daily., Disp: 40 tablet, Rfl: 0;  aspirin EC 81 MG tablet, Take 81 mg by mouth daily. , Disp: , Rfl: ;  atorvastatin (LIPITOR) 40 MG tablet, Take 40 mg by mouth at bedtime., Disp: , Rfl: ;  B-D ULTRAFINE III SHORT PEN 31G X 8 MM MISC, Follow directions provided by physician, Disp: 30 each, Rfl: 2 benzonatate (TESSALON) 200 MG capsule, Take 1 capsule  (200 mg total) by mouth 3 (three) times daily., Disp: 20 capsule, Rfl: 0;  Blood Glucose Monitoring Suppl (FREESTYLE FREEDOM LITE) W/DEVICE KIT, Use as adviced, Disp: 1 each, Rfl: 0;  buPROPion (WELLBUTRIN XL) 300 MG 24 hr tablet, Take 300 mg by mouth daily., Disp: , Rfl:  carvedilol (COREG) 25 MG tablet, Take 1 tablet (25 mg total) by mouth 2 (two) times daily with a meal., Disp: 180 tablet, Rfl: 3;  chlorpheniramine-HYDROcodone (TUSSIONEX) 10-8 MG/5ML LQCR, Take 5 mLs by mouth every 12 (twelve) hours., Disp: 115 mL, Rfl: 0;  cholecalciferol (VITAMIN D) 1000 UNITS tablet, Take 1,000 Units by mouth 2 (two) times daily., Disp: , Rfl:  ciclesonide (OMNARIS) 50 MCG/ACT nasal spray, Place 1 spray into both nostrils daily. , Disp: , Rfl: ;  DEXILANT 60 MG capsule, Take one capsule by mouth one time daily, Disp: 30 capsule, Rfl: 5;  dextromethorphan-guaiFENesin (MUCINEX DM) 30-600 MG per 12 hr tablet, Take 1 tablet by mouth 2 (two) times daily., Disp: 60 tablet, Rfl: 0 diltiazem (CARDIZEM CD) 120 MG 24 hr capsule, Take 1 capsule (120 mg total) by mouth every evening., Disp: 30 capsule, Rfl: 11;  estradiol (ESTRACE) 2 MG tablet, Take 2 mg by mouth at bedtime. , Disp: , Rfl: ;  fenofibrate (TRICOR) 145 MG tablet, Take one tablet by mouth one time daily, Disp: 30 tablet, Rfl: 10;  fluticasone (FLONASE) 50 MCG/ACT nasal spray, Place 2 sprays into both nostrils daily., Disp: 16 g, Rfl: 2 furosemide (LASIX) 40 MG tablet, Take 1 tablet (40 mg total) by mouth 2 (two) times daily. Take additional 40 mg in AM of 7/15 & 7/16 - to shoot for dry wgt of ~2 lb below current weight., Disp: 45 tablet, Rfl: 12;  glipiZIDE (GLIPIZIDE XL) 10 MG 24 hr tablet, Take 1 tablet (10 mg total) by mouth daily with breakfast., Disp: 30 tablet, Rfl: 2 glucose blood (ONE TOUCH ULTRA TEST) test strip, 1 each by Other route 3 (three) times daily. Use as instructed, Disp: 100 each, Rfl: 5;  insulin glargine (LANTUS) 100 UNIT/ML injection, Inject 23  Units into the skin at bedtime., Disp: , Rfl: ;  levofloxacin (LEVAQUIN) 500 MG tablet, Take 1 tablet (500 mg total) by mouth daily., Disp: 3 tablet, Rfl: 0 LORazepam (ATIVAN) 0.5 MG tablet, Take 1 tablet (0.5 mg total) by mouth 2 (two) times daily as needed for anxiety., Disp: 30 tablet, Rfl: 1;  losartan (COZAAR) 100 MG tablet, Take 100 mg by mouth at bedtime., Disp: , Rfl: ;  metFORMIN (GLUCOPHAGE) 1000 MG tablet, Take 1 tablet (1,000 mg total) by mouth 2 (  two) times daily with a meal., Disp: 180 tablet, Rfl: 3 mometasone-formoterol (DULERA) 200-5 MCG/ACT AERO, Inhale 2 puffs into the lungs 2 (two) times daily., Disp: 1 Inhaler, Rfl: 6;  nitroGLYCERIN (NITROSTAT) 0.4 MG SL tablet, Place 0.4 mg under the tongue every 5 (five) minutes as needed. x3 doses as needed for chest pain., Disp: , Rfl: ;  nystatin-triamcinolone ointment (MYCOLOG), Apply 1 application topically 2 (two) times daily., Disp: 30 g, Rfl: 0 omeprazole (PRILOSEC) 20 MG capsule, Take 1 capsule (20 mg total) by mouth 2 (two) times daily before a meal., Disp: 90 capsule, Rfl: 1;  PARoxetine Mesylate (BRISDELLE) 7.5 MG CAPS, Take 7.5 mg by mouth daily., Disp: , Rfl: ;  potassium chloride SA (K-DUR,KLOR-CON) 20 MEQ tablet, Take 30 mEq by mouth 2 (two) times daily., Disp: , Rfl: ;  predniSONE (DELTASONE) 5 MG tablet, Take 10 mg by mouth every morning. , Disp: , Rfl:  sucralfate (CARAFATE) 1 GM/10ML suspension, Take 10 mLs (1 g total) by mouth 4 (four) times daily -  with meals and at bedtime., Disp: 420 mL, Rfl: 0;  venlafaxine XR (EFFEXOR-XR) 75 MG 24 hr capsule, Take 150 mg by mouth at bedtime., Disp: , Rfl:   EXAM:  There were no vitals filed for this visit.  There is no weight on file to calculate BMI.  GENERAL: vitals reviewed and listed above, alert, oriented, appears well hydrated and in no acute distress  HEENT: atraumatic, conjunttiva clear, no obvious abnormalities on inspection of external nose and ears  NECK: no obvious masses  on inspection  LUNGS: clear to auscultation bilaterally, no wheezes, rales or rhonchi, good air movement  CV: HRRR, no peripheral edema  MS: moves all extremities without noticeable abnormality -TTP is in soft tissues L lateral side, no bony TTP, mild bruising  PSYCH: pleasant and cooperative, no obvious depression or anxiety  ASSESSMENT AND PLAN:  Discussed the following assessment and plan:  Contusion of soft tissue  -L lateral breast -discussed options and offered imaging - she opted for conservative tx -Patient advised to return or notify a doctor immediately if symptoms worsen or persist or new concerns arise.  There are no Patient Instructions on file for this visit.   Colin Benton R.

## 2014-01-03 ENCOUNTER — Encounter (HOSPITAL_COMMUNITY): Payer: BC Managed Care – PPO

## 2014-01-03 DIAGNOSIS — S92309A Fracture of unspecified metatarsal bone(s), unspecified foot, initial encounter for closed fracture: Secondary | ICD-10-CM | POA: Diagnosis not present

## 2014-01-08 ENCOUNTER — Encounter (HOSPITAL_COMMUNITY): Payer: BC Managed Care – PPO

## 2014-01-08 DIAGNOSIS — H1045 Other chronic allergic conjunctivitis: Secondary | ICD-10-CM | POA: Diagnosis not present

## 2014-01-08 DIAGNOSIS — H01119 Allergic dermatitis of unspecified eye, unspecified eyelid: Secondary | ICD-10-CM | POA: Diagnosis not present

## 2014-01-08 DIAGNOSIS — H16209 Unspecified keratoconjunctivitis, unspecified eye: Secondary | ICD-10-CM | POA: Diagnosis not present

## 2014-01-10 ENCOUNTER — Encounter (HOSPITAL_COMMUNITY): Payer: BC Managed Care – PPO

## 2014-01-10 DIAGNOSIS — H16209 Unspecified keratoconjunctivitis, unspecified eye: Secondary | ICD-10-CM | POA: Diagnosis not present

## 2014-01-10 DIAGNOSIS — H1045 Other chronic allergic conjunctivitis: Secondary | ICD-10-CM | POA: Diagnosis not present

## 2014-01-10 DIAGNOSIS — H01119 Allergic dermatitis of unspecified eye, unspecified eyelid: Secondary | ICD-10-CM | POA: Diagnosis not present

## 2014-01-12 DIAGNOSIS — H109 Unspecified conjunctivitis: Secondary | ICD-10-CM | POA: Diagnosis not present

## 2014-01-15 ENCOUNTER — Encounter (HOSPITAL_COMMUNITY): Payer: BC Managed Care – PPO

## 2014-01-15 DIAGNOSIS — H1045 Other chronic allergic conjunctivitis: Secondary | ICD-10-CM | POA: Diagnosis not present

## 2014-01-15 DIAGNOSIS — H16129 Filamentary keratitis, unspecified eye: Secondary | ICD-10-CM | POA: Diagnosis not present

## 2014-01-16 DIAGNOSIS — H16209 Unspecified keratoconjunctivitis, unspecified eye: Secondary | ICD-10-CM | POA: Diagnosis not present

## 2014-01-17 ENCOUNTER — Encounter (HOSPITAL_COMMUNITY): Payer: BC Managed Care – PPO

## 2014-01-18 DIAGNOSIS — H16209 Unspecified keratoconjunctivitis, unspecified eye: Secondary | ICD-10-CM | POA: Diagnosis not present

## 2014-01-21 ENCOUNTER — Telehealth: Payer: Self-pay | Admitting: *Deleted

## 2014-01-21 DIAGNOSIS — R911 Solitary pulmonary nodule: Secondary | ICD-10-CM

## 2014-01-21 DIAGNOSIS — D860 Sarcoidosis of lung: Secondary | ICD-10-CM

## 2014-01-21 DIAGNOSIS — J449 Chronic obstructive pulmonary disease, unspecified: Secondary | ICD-10-CM

## 2014-01-21 NOTE — Telephone Encounter (Signed)
Order placed for CT Chest without contrast. Called, spoke with pt.  Informed her order placed to repeat CT Chest.  Advised PCCs will be contacting her with appt date, time, and location.  She verbalized understanding and voiced no further questions or concerns at this time.

## 2014-01-21 NOTE — Telephone Encounter (Signed)
Message copied by Adalberto Ill on Mon Jan 21, 2014  8:31 AM ------      Message from: Asencion Noble E      Created: Wed Jan 16, 2014 12:25 PM       Buel Molder             Needs CT chest non contrasted             pw      ----- Message -----         From: Elsie Stain, MD         Sent: 04/16/2013   3:19 PM           To: Elsie Stain, MD            chk ct chest       ------

## 2014-01-22 ENCOUNTER — Encounter (HOSPITAL_COMMUNITY): Payer: BC Managed Care – PPO

## 2014-01-22 DIAGNOSIS — S92309A Fracture of unspecified metatarsal bone(s), unspecified foot, initial encounter for closed fracture: Secondary | ICD-10-CM | POA: Diagnosis not present

## 2014-01-23 ENCOUNTER — Encounter: Payer: Self-pay | Admitting: Dietician

## 2014-01-23 ENCOUNTER — Encounter: Payer: BC Managed Care – PPO | Attending: Internal Medicine | Admitting: Dietician

## 2014-01-23 VITALS — Ht 67.0 in | Wt 265.0 lb

## 2014-01-23 DIAGNOSIS — Z713 Dietary counseling and surveillance: Secondary | ICD-10-CM | POA: Insufficient documentation

## 2014-01-23 DIAGNOSIS — IMO0002 Reserved for concepts with insufficient information to code with codable children: Secondary | ICD-10-CM

## 2014-01-23 DIAGNOSIS — E1149 Type 2 diabetes mellitus with other diabetic neurological complication: Secondary | ICD-10-CM

## 2014-01-23 DIAGNOSIS — E119 Type 2 diabetes mellitus without complications: Secondary | ICD-10-CM | POA: Insufficient documentation

## 2014-01-23 DIAGNOSIS — E1165 Type 2 diabetes mellitus with hyperglycemia: Secondary | ICD-10-CM

## 2014-01-23 DIAGNOSIS — H16209 Unspecified keratoconjunctivitis, unspecified eye: Secondary | ICD-10-CM | POA: Diagnosis not present

## 2014-01-23 NOTE — Patient Instructions (Signed)
Plan:  Aim for 2 Carb Choices per meal (30 grams) +/- 1 either way  Aim for 0-1 Carbs per snack if hungry Consider including protein with meals and snacks Consider reading food labels for Total Carbohydrate and Fat Grams of foods Consider  increasing your activity level by doing arm chair exercise 3 days a week as tolerated

## 2014-01-23 NOTE — Progress Notes (Signed)
  Medical Nutrition Therapy:  Appt start time: 0930 end time:  1045.   Assessment:  Primary concerns today: Would like to gain more control over blood sugar through diet. May 2014, patient's A1C was 7.2, last visit with endocrinologist, march 2015, patient's A1C was 8.7. Patient is taking prednisone and has been taking prednisone as of 2003. Patient reports that she managed blood sugar fine up until recently and wasn't able to site any additional life changes. Diabetes medications were adjusted prior to visit and patient reports doing well with adjustments. Based on diet assessment, patient eats an excess of carbohydrates in the later part of the day.   Preferred Learning Style:   No preference indicated   Learning Readiness:   Contemplating   MEDICATIONS: See Chart   DIETARY INTAKE:  Usual eating pattern includes 3 meals and 2 snacks per day.   24-hr recall:  B ( AM): Eats everyday, deviled egg sandwich, or bacon and eggs, goes out 1-2 days per week - [place], chicken biscuit, diet dr. Malachi Bonds or coffee with splenda, a little cream.  Snk ( AM): none, water diet dr. Malachi Bonds  L ( PM): Ham and cheese sandwich with mustard and baked chips, diet soda Snk ( PM): apple, orange or banana, water D ( PM): Beans and potatoes and sometimes meat like pork chop or roast,  Snk ( PM): berries or cookies or chips, diet drink or unsweetened tea with splenda, graham crackers and peanut butter Wake thirsty - water, sometimes when on high dose of prednisone will wake up hungry  Usual physical activity: on disability, will walk some when it is cool.  Checks blood sugar 3 times a day, morning, before dinner, and at night This morning 235 - also on steroid eye drops for two weeks Last night 230 something  Blood sugar in the morning usually 1something to 2 something   Estimated energy needs: 1800 calories 60-75 g carbohydrates  Progress Towards Goal(s):  No progress.   Nutritional Diagnosis:   NB-1.1 Food and nutrition-related knowledge deficit As related to knowledge of what foods affect blood sugar and appropriate portion sizes.  As evidenced by Patient's verbalization of knowledge defecit and A1C of 8.7.    Intervention:  Nutrition education and counseling.  Educated on how food and exercise affect blood sugar, what foods affect blood sugar and which ones do not, carbohydrate counting, Nutrition Facts label reading for carbohydrate and fat, MyPlate method for carbohydrate counting. We also talked about fat intake and how to reduce fat intake.  Plan:  Aim for 2 Carb Choices per meal (30 grams) +/- 1 either way  Aim for 0-1 Carbs per snack if hungry Consider including protein with meals and snacks Consider reading food labels for Total Carbohydrate and Fat Grams of foods Consider  increasing your activity level by doing arm chair exercise 3 days a week as tolerated  Teaching Method Utilized:  Visual Auditory Hands on  Handouts given during visit include:  Yellow Card  Living with diabetes  MyPlate  Armchair exercises  Barriers to learning/adherence to lifestyle change: Physical for activity, patient had some difficulty with teach back of carbohydrate identification and counting.  Demonstrated degree of understanding via:  Teach Back   Monitoring/Evaluation:  Dietary intake, exercise, carbohydrate counting, and body weight prn

## 2014-01-24 ENCOUNTER — Ambulatory Visit (INDEPENDENT_AMBULATORY_CARE_PROVIDER_SITE_OTHER)
Admission: RE | Admit: 2014-01-24 | Discharge: 2014-01-24 | Disposition: A | Discharge status: Home / Self Care | Payer: BC Managed Care – PPO | Source: Ambulatory Visit | Attending: Critical Care Medicine | Admitting: Critical Care Medicine

## 2014-01-24 ENCOUNTER — Encounter (HOSPITAL_COMMUNITY): Payer: BC Managed Care – PPO

## 2014-01-24 DIAGNOSIS — D869 Sarcoidosis, unspecified: Secondary | ICD-10-CM | POA: Diagnosis not present

## 2014-01-24 DIAGNOSIS — D860 Sarcoidosis of lung: Secondary | ICD-10-CM

## 2014-01-24 DIAGNOSIS — R911 Solitary pulmonary nodule: Secondary | ICD-10-CM | POA: Diagnosis not present

## 2014-01-24 DIAGNOSIS — J99 Respiratory disorders in diseases classified elsewhere: Secondary | ICD-10-CM

## 2014-01-24 DIAGNOSIS — J449 Chronic obstructive pulmonary disease, unspecified: Secondary | ICD-10-CM | POA: Diagnosis not present

## 2014-01-25 NOTE — Progress Notes (Signed)
Quick Note:  Called, spoke with pt. Informed her of CT results per Dr. Joya Gaskins. She verbalized understanding and voiced no further questions or concerns at this time. Message also send through [place] to pt with results. ______

## 2014-01-28 ENCOUNTER — Encounter: Payer: Self-pay | Admitting: Internal Medicine

## 2014-01-28 ENCOUNTER — Ambulatory Visit (INDEPENDENT_AMBULATORY_CARE_PROVIDER_SITE_OTHER): Payer: BC Managed Care – PPO | Admitting: Internal Medicine

## 2014-01-28 VITALS — BP 122/70 | HR 93 | Temp 98.5°F | Resp 12 | Wt 267.0 lb

## 2014-01-28 DIAGNOSIS — IMO0002 Reserved for concepts with insufficient information to code with codable children: Secondary | ICD-10-CM

## 2014-01-28 DIAGNOSIS — E1165 Type 2 diabetes mellitus with hyperglycemia: Secondary | ICD-10-CM

## 2014-01-28 DIAGNOSIS — E1149 Type 2 diabetes mellitus with other diabetic neurological complication: Secondary | ICD-10-CM

## 2014-01-28 MED ORDER — CANAGLIFLOZIN 300 MG PO TABS: ORAL_TABLET | ORAL | Status: DC

## 2014-01-28 NOTE — Progress Notes (Signed)
Patient ID: [name], female   DOB: January 26, 1961, 53 y.o.   MRN: [id number]  HPI: [name] is a 53 y.o.-year-old female, returning for f/u DM2, dx 2007, insulin-dependent, uncontrolled, with complications (CHF stage 2, peripheral neuropathy). Last visit 1 mo ago.  She recently had an eye infection >> started on Prednisone eye drops >> still continues.  Last hemoglobin A1c was: Lab Results  Component Value Date   HGBA1C 8.7* 12/10/2013   HGBA1C 7.9* 07/24/2013   HGBA1C 7.2* 03/06/2013  She is on Prednisone 10 mg for sarcoidosis. Last increase was to 60 mg ~1 mo ago.  [name] on a regimen of: - Metformin 500 >> 1000 mg po bid >> tolerates it well. She is on a B-complex. - Glipizide XL 10 mg in am - Lantus 23 units qhs - pen We stopped Glyburide 2.5 mg at last visit.   Pt checks her sugars 0-1 a day and they are: - am: 130-260 >> 109-262 (higher sugars after Prednisone) - 2h after b'fast: n/c >> 200, 315 - before lunch: n/c >> n/c - 2h after lunch: n/c >> n/c - before dinner: n/c >> 230 - 2h after dinner: n/c >> 245 - bedtime: 200s >> 241-462 (mostly 200s) - nighttime: n/c No lows. Lowest sugar was 109; she has hypoglycemia awareness at 70s.  Highest sugar was 462 >> blurry vision and dry mouth.   Meter: Freestyle Freedom Lite.   She had a nutrition class last week >> helped.   Pt's meals are: - Breakfast: egg + bacon or if go out: bisquit - Lunch: sandwich - Dinner: meat + veggies + starch - Snacks: pork skins and cookies - 1-2 a day; also after dinner No exercise.  - no CKD, last BUN/creatinine:  Lab Results  Component Value Date   BUN 20 12/10/2013   CREATININE 0.8 12/10/2013  On Losartan. - last set of lipids: Lab Results  Component Value Date   CHOL 211* 12/10/2013   HDL 44.80 12/10/2013   LDLCALC UNABLE TO CALCULATE IF TRIGLYCERIDE OVER 400 mg/dL 05/28/2011   LDLDIRECT 117.9 12/10/2013   TRIG 508.0* 12/10/2013   CHOLHDL 5 12/10/2013  On Lipitor. - last eye exam  was in 03/2013 - sees eye dr once a year. No DR.  - + numbness and tingling in her feet and legs hurt. Se has restless legs.   Does have frequent urination and nocturia 2-3/night.   She also has gout, HL, HTN, sarcoidosis of lung, GERD, COPD, OSA >> resolved.  ROS: Constitutional: no weight gain/loss, no fatigue, no subjective hyperthermia/hypothermia, + nocturia x 2-3 Eyes: no blurry vision, no xerophthalmia ENT: no sore throat, no nodules palpated in throat, no dysphagia/odynophagia, no hoarseness Cardiovascular: no CP/SOB/palpitations/+ leg swelling Respiratory: no cough/no SOB Gastrointestinal: no N/V/D/C, + heartburn Musculoskeletal: no muscle/joint aches Skin: no rashes Neurological: no tremors/+ numbness - feet/tingling/dizziness  PE: BP 122/70  Pulse 93  Temp(Src) 98.5 F (36.9 C) (Oral)  Resp 12  Wt 267 lb (121.11 kg)  SpO2 97% Wt Readings from Last 3 Encounters:  01/28/14 267 lb (121.11 kg)  01/23/14 265 lb (120.203 kg)  12/27/13 263 lb (119.296 kg)   Constitutional: obese, in NAD Eyes: PERRLA, EOMI, no exophthalmos ENT: moist mucous membranes, no thyromegaly, no cervical lymphadenopathy Cardiovascular: RRR, No MRG Respiratory: CTA B Gastrointestinal: abdomen soft, NT, ND, BS+ Musculoskeletal: no deformities, strength intact in all 4 Skin: moist, warm, no rashes Neurological: + mild tremor with outstretched hands, DTR 2/5 in all  4  ASSESSMENT: 1. DM2, insulin-dependent, uncontrolled, with complications - CHF stage 2 - peripheral neuropathy  PLAN:  1. Patient with long-standing, recently more uncontrolled diabetes, on oral antidiabetic regimen + Basal insulin. Sugars very high especially later in the day. We will try Invokana and she will start working on her diet, in hopes to afford mealtime insulin. - I suggested to:  Patient Instructions  Please continue current diabetes regimen, and add Invokana 100 mg daily in am for 7 days, then start 300 mg in  am. Please return in 1 month with your sugar log.  - we discussed about SEs of Invokana, which are: dizziness (advised to be careful when stands from sitting position), decreased BP - usually not < normal (BP today is not low), and fungal UTIs (advised to let me know if develops one).  - we can try NPH in am if sugars do not improve. The insulin profile of NPH matches the prednisone concentration in blood - continue checking sugars at different times of the day - check 2 times a day, rotating checks - advised for yearly eye exams - she is up to date - Return to clinic in 1 mo with sugar log

## 2014-01-28 NOTE — Patient Instructions (Signed)
Please continue current diabetes regimen, and add Invokana 100 mg daily in am for 7 days, then start 300 mg in am. Please return in 1 month with your sugar log.

## 2014-01-29 ENCOUNTER — Encounter (HOSPITAL_COMMUNITY): Payer: BC Managed Care – PPO

## 2014-01-31 ENCOUNTER — Encounter (HOSPITAL_COMMUNITY): Payer: BC Managed Care – PPO

## 2014-02-04 ENCOUNTER — Encounter: Payer: Self-pay | Admitting: Family Medicine

## 2014-02-04 ENCOUNTER — Ambulatory Visit (INDEPENDENT_AMBULATORY_CARE_PROVIDER_SITE_OTHER): Payer: BC Managed Care – PPO | Admitting: Family Medicine

## 2014-02-04 VITALS — BP 142/80 | HR 92 | Temp 97.9°F | Ht 67.0 in | Wt 266.0 lb

## 2014-02-04 DIAGNOSIS — D869 Sarcoidosis, unspecified: Secondary | ICD-10-CM

## 2014-02-04 DIAGNOSIS — M545 Low back pain, unspecified: Secondary | ICD-10-CM

## 2014-02-04 DIAGNOSIS — J209 Acute bronchitis, unspecified: Secondary | ICD-10-CM

## 2014-02-04 DIAGNOSIS — J441 Chronic obstructive pulmonary disease with (acute) exacerbation: Secondary | ICD-10-CM

## 2014-02-04 DIAGNOSIS — J99 Respiratory disorders in diseases classified elsewhere: Secondary | ICD-10-CM

## 2014-02-04 DIAGNOSIS — D860 Sarcoidosis of lung: Secondary | ICD-10-CM

## 2014-02-04 LAB — POCT URINALYSIS DIPSTICK
Bilirubin, UA: NEGATIVE
Blood, UA: NEGATIVE
Glucose, UA: 2
Ketones, UA: NEGATIVE
Leukocytes, UA: NEGATIVE
Nitrite, UA: NEGATIVE
Spec Grav, UA: 1.01
Urobilinogen, UA: 0.2
pH, UA: 6.5

## 2014-02-04 MED ORDER — METHYLPREDNISOLONE ACETATE 80 MG/ML IJ SUSP
160.0000 mg | Freq: Once | INTRAMUSCULAR | Status: AC
  Administered 2014-02-04: 160 mg via INTRAMUSCULAR

## 2014-02-04 MED ORDER — AZITHROMYCIN 250 MG PO TABS: ORAL_TABLET | ORAL | Status: DC

## 2014-02-04 MED ORDER — HYDROCOD POLST-CHLORPHEN POLST 10-8 MG/5ML PO LQCR: 5.0000 mL | Freq: Two times a day (BID) | ORAL | Status: DC

## 2014-02-04 NOTE — Progress Notes (Signed)
   Subjective:    Patient ID: [name], female    DOB: Oct 18, 1960, 53 y.o.   MRN: [id number]  HPI Here for 3 days of chest tightness and coughing up green sputum. No fever. Using albuterol in her nebulizer.    Review of Systems  Constitutional: Negative.   HENT: Negative.   Eyes: Negative.   Respiratory: Positive for cough, chest tightness and shortness of breath.   Cardiovascular: Negative.        Objective:   Physical Exam  Constitutional: She appears well-developed and well-nourished.  HENT:  Right Ear: External ear normal.  Left Ear: External ear normal.  Nose: Nose normal.  Mouth/Throat: Oropharynx is clear and moist.  Eyes: Conjunctivae are normal.  Pulmonary/Chest: Effort normal. She has no rales.  Scattered wheezes and rhonchi   Lymphadenopathy:    She has no cervical adenopathy.          Assessment & Plan:  Add Mucinex

## 2014-02-04 NOTE — Progress Notes (Signed)
Pre visit review using our clinic review tool, if applicable. No additional management support is needed unless otherwise documented below in the visit note. 

## 2014-02-04 NOTE — Addendum Note (Signed)
Addended by: Aggie Hacker A on: 02/04/2014 05:01 PM   Modules accepted: Orders

## 2014-02-05 ENCOUNTER — Encounter (HOSPITAL_COMMUNITY): Payer: BC Managed Care – PPO

## 2014-02-06 DIAGNOSIS — H16209 Unspecified keratoconjunctivitis, unspecified eye: Secondary | ICD-10-CM | POA: Diagnosis not present

## 2014-02-07 ENCOUNTER — Encounter (HOSPITAL_COMMUNITY): Payer: BC Managed Care – PPO

## 2014-02-11 ENCOUNTER — Encounter: Payer: Self-pay | Admitting: Family Medicine

## 2014-02-11 ENCOUNTER — Ambulatory Visit (INDEPENDENT_AMBULATORY_CARE_PROVIDER_SITE_OTHER): Payer: BC Managed Care – PPO | Admitting: Family Medicine

## 2014-02-11 VITALS — BP 120/80 | HR 103 | Temp 98.3°F | Wt 264.0 lb

## 2014-02-11 DIAGNOSIS — R0602 Shortness of breath: Secondary | ICD-10-CM

## 2014-02-11 DIAGNOSIS — G4730 Sleep apnea, unspecified: Secondary | ICD-10-CM

## 2014-02-11 DIAGNOSIS — D869 Sarcoidosis, unspecified: Secondary | ICD-10-CM

## 2014-02-11 DIAGNOSIS — J441 Chronic obstructive pulmonary disease with (acute) exacerbation: Secondary | ICD-10-CM

## 2014-02-11 DIAGNOSIS — D860 Sarcoidosis of lung: Secondary | ICD-10-CM

## 2014-02-11 DIAGNOSIS — J99 Respiratory disorders in diseases classified elsewhere: Secondary | ICD-10-CM

## 2014-02-11 DIAGNOSIS — J383 Other diseases of vocal cords: Secondary | ICD-10-CM

## 2014-02-11 NOTE — Progress Notes (Signed)
   Subjective:    Patient ID: [name], female    DOB: Jan 10, 1961, 53 y.o.   MRN: [id number]  HPI [name] is a 53 year old female patient of Dr. Cain [name] who comes in today for evaluation of a cough  She was seen here within the past couple days and given a shot of steroids and a Z-Pak for a cough yellow GI none. She has a history of severe pulmonary disease sarcoidosis COPD etc. etc.   Review of Systems    negative except for persisting cough Objective:   Physical Exam  Well-developed well-nourished female no acute distress vital signs stable she is afebrile HEENT HEENT negative except for serous otitis media right ear neck was supple no adenopathy lungs were clear      Assessment & Plan:  Persistent cough with a history of underlying sarcoidosis and COPD,,,,,,,,,,,, this patient really needs to be seen and evaluated by Dr. Fraser [name] her pulmonologist

## 2014-02-11 NOTE — Patient Instructions (Signed)
Call pulmonary now and make an appointment to see your doctor tomorrow  Rest at home  Cough syrup 1/2-1 teaspoon 3 times daily when necessary until you see your lung specialist tomorrow  In the future if you have any pulmonary problems I would call and be seen by her pulmonologist first

## 2014-02-11 NOTE — Progress Notes (Signed)
Pre visit review using our clinic review tool, if applicable. No additional management support is needed unless otherwise documented below in the visit note. 

## 2014-02-12 ENCOUNTER — Encounter (HOSPITAL_COMMUNITY): Payer: BC Managed Care – PPO

## 2014-02-12 ENCOUNTER — Encounter: Payer: Self-pay | Admitting: Adult Health

## 2014-02-12 ENCOUNTER — Ambulatory Visit (INDEPENDENT_AMBULATORY_CARE_PROVIDER_SITE_OTHER): Payer: BC Managed Care – PPO | Admitting: Adult Health

## 2014-02-12 VITALS — BP 136/68 | HR 90 | Temp 98.7°F | Ht 65.5 in | Wt 263.2 lb

## 2014-02-12 DIAGNOSIS — J99 Respiratory disorders in diseases classified elsewhere: Secondary | ICD-10-CM

## 2014-02-12 DIAGNOSIS — D869 Sarcoidosis, unspecified: Secondary | ICD-10-CM

## 2014-02-12 DIAGNOSIS — D860 Sarcoidosis of lung: Secondary | ICD-10-CM

## 2014-02-12 MED ORDER — LEVALBUTEROL HCL 0.63 MG/3ML IN NEBU
0.6300 mg | INHALATION_SOLUTION | Freq: Once | RESPIRATORY_TRACT | Status: AC
  Administered 2014-02-12: 0.63 mg via RESPIRATORY_TRACT

## 2014-02-12 NOTE — Addendum Note (Signed)
Addended by: Parke Poisson E on: 02/12/2014 05:45 PM   Modules accepted: Orders

## 2014-02-12 NOTE — Assessment & Plan Note (Addendum)
Flare with upper airway cough syndrome Control triggers  Steroid pt education , close BS monitoring w/ DM   Plan  May add  Delsym 2 tsp Twice daily  As needed.  Continue with tessalon Three times a day  For cough as needed.  Prednisone 20mg  daily for 2 week then back to 10mg  daily  Tussionex 1 tsp Twice daily  As needed  Severe cough, may make you sleepy.  GERD diet  Follow up Dr. Joya Gaskins  In 4-6 weeks and As needed   Please contact office for sooner follow up if symptoms do not improve or worsen or seek emergency care

## 2014-02-12 NOTE — Patient Instructions (Signed)
May add  Delsym 2 tsp Twice daily  As needed.  Continue with tessalon Three times a day  For cough as needed.  Prednisone 20mg  daily for 2 week then back to 10mg  daily  Tussionex 1 tsp Twice daily  As needed  Severe cough, may make you sleepy.  GERD diet  Follow up Dr. Joya Gaskins  In 4-6 weeks and As needed   Please contact office for sooner follow up if symptoms do not improve or worsen or seek emergency care

## 2014-02-12 NOTE — Progress Notes (Signed)
  Subjective:    Patient ID: [name], female    DOB: 1961-03-01, 53 y.o.   MRN: [id number]  HPI  18 WF with Sarcoid, GERD, VCD, cyclical cough     4/0/9811  Acute OV  Complains of increased SOB, wheezing, dry hacking cough, some chest tightness, bilateral ear discomfort, sore throat x1 week.  Saw PCP at onset and given zpak and depo injection with no relief. Finished Zpack 4 days ago.  Does not feel it helped .  Cough is dry cough. Using tussionex and tessalon for cough control . Cough is keeping her up at night .  Denies any f/c/s, hemooptysis, nausea, vomiting., gerd, discolored mucus or rash.  On Coreg 50mg .daily  CT chest 01/21/14 showed stable RUL nodule x 2 yrs. No hilar adenopathy.     Review of Systems  Constitutional:   No  weight loss, night sweats,  Fevers, chills,  +fatigue, or  lassitude.  HEENT:   No headaches,  Difficulty swallowing,  Tooth/dental problems, or  Sore throat,                No sneezing, itching, ear ache,  +nasal congestion, post nasal drip,   CV:  No chest pain,  Orthopnea, PND, swelling in lower extremities, anasarca, dizziness, palpitations, syncope.   GI  No heartburn, indigestion, abdominal pain, nausea, vomiting, diarrhea, change in bowel habits, loss of appetite, bloody stools.   Resp:  ,  No coughing up of blood.   [name]  No chest wall deformity  Skin: no rash or lesions.  GU: no dysuria, change in color of urine, no urgency or frequency.  No flank pain, no hematuria   MS:  No joint pain or swelling.  No decreased range of motion.  No back pain.  Psych:  No change in mood or affect. No depression or anxiety.  No memory loss.    '    Objective:   Physical Exam  GEN: A/Ox3; pleasant , NAD   HEENT:  [place]/AT,  EACs-clear, TMs-wnl, NOSE-clear drainage THROAT-clear, no lesions, no postnasal drip or exudate noted.   NECK:  Supple w/ fair ROM; no JVD; normal carotid impulses w/o bruits; no thyromegaly or nodules palpated; no  lymphadenopathy.  RESP  Diminished BS in bases, upper airway psuedowheezing ,  no dullness to percussion  CARD:  RRR, no m/r/g  , no peripheral edema, pulses intact, no cyanosis or clubbing.  GI:   Soft & nt; nml bowel sounds; no organomegaly or masses detected.  Musco: Warm bil, no deformities or joint swelling noted.   Neuro: alert, no focal deficits noted.    Skin: Warm, no lesions or rashes         Assessment & Plan:

## 2014-02-14 ENCOUNTER — Encounter (HOSPITAL_COMMUNITY): Payer: BC Managed Care – PPO

## 2014-02-19 ENCOUNTER — Encounter (HOSPITAL_COMMUNITY): Payer: BC Managed Care – PPO

## 2014-02-19 IMAGING — CR DG CHEST 2V
2 series · 2 of 2 positions shown · non-contrast
Comparison: CT chest 02/11/2013 and PA and lateral chest
02/03/2013.

CLINICAL DATA: Chest pain and shortness of breath.

CHEST - 2 VIEW

[w chest pa]
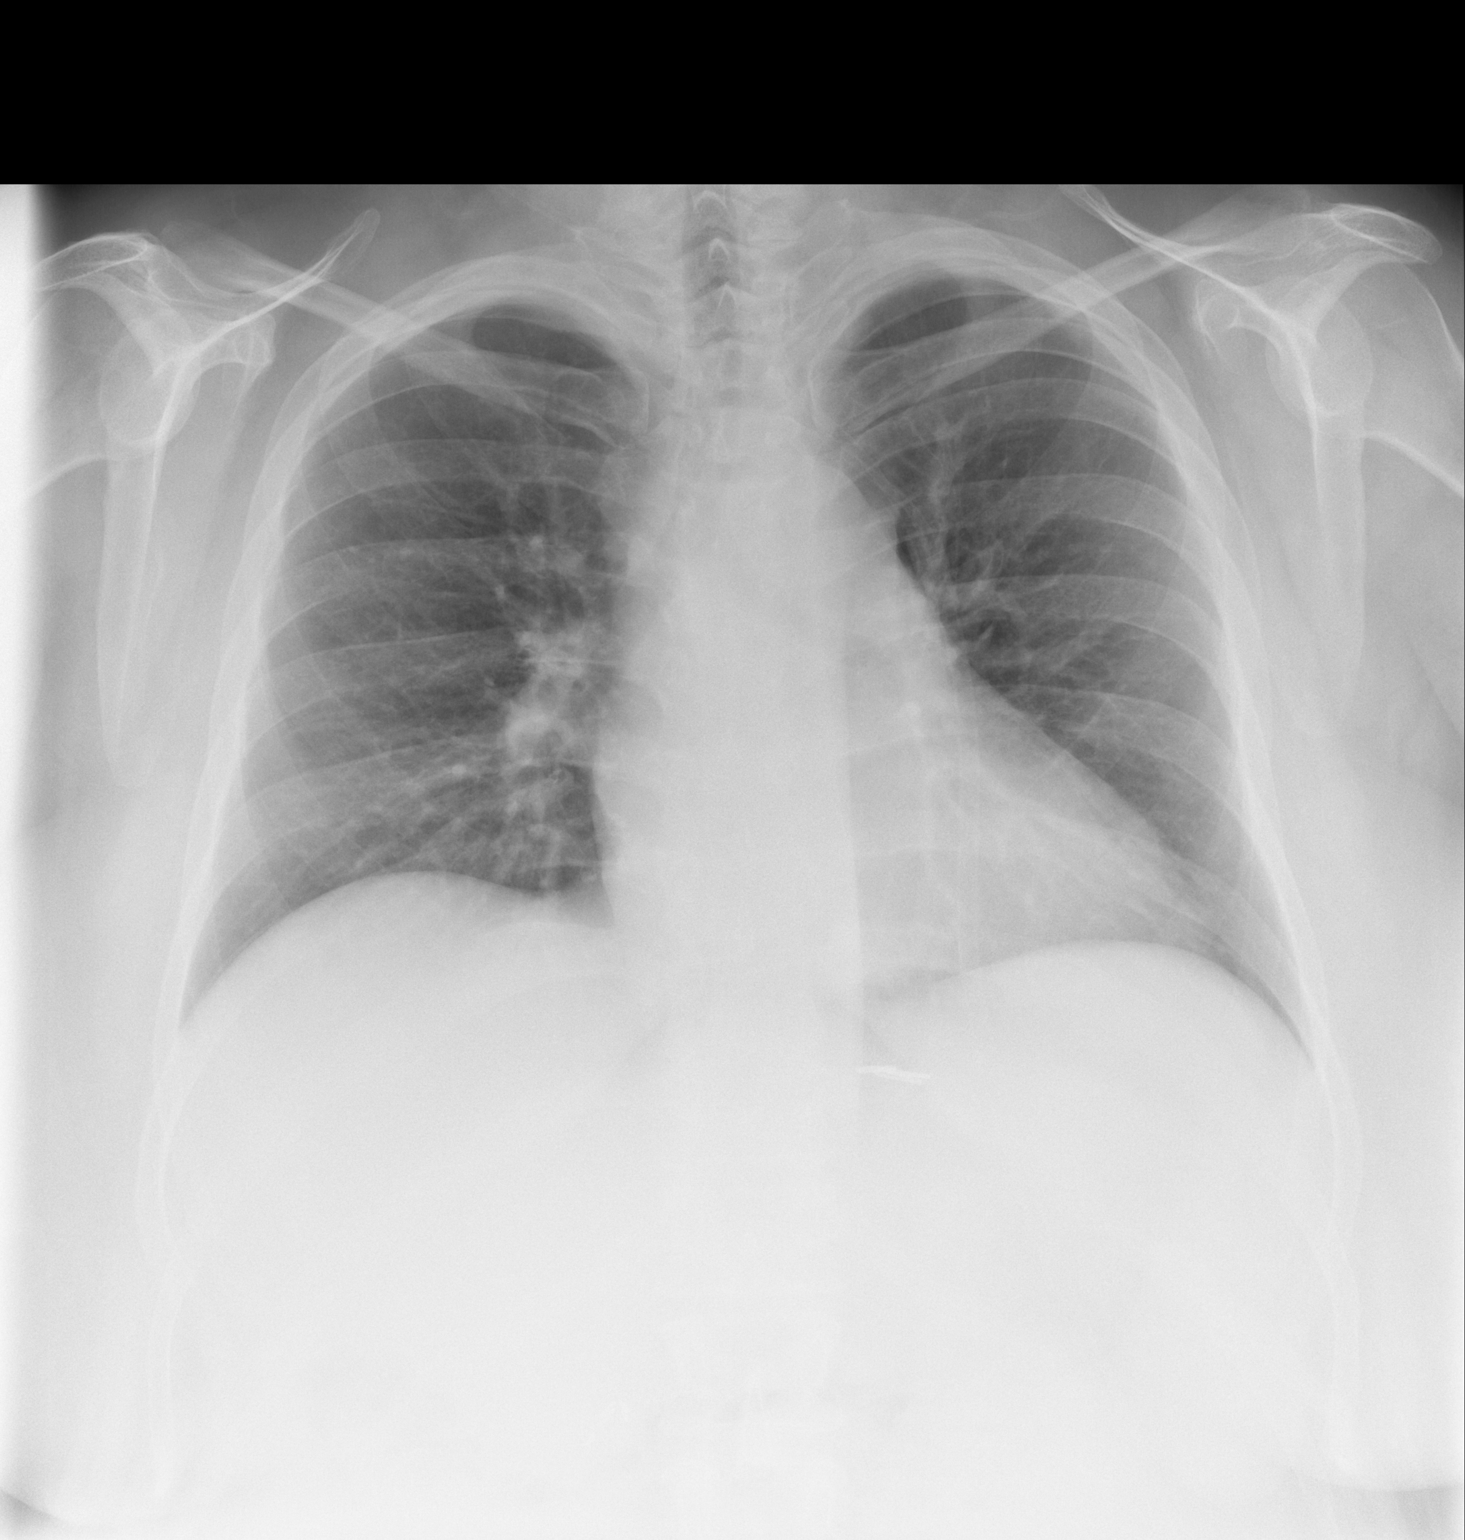

[w chest lat]
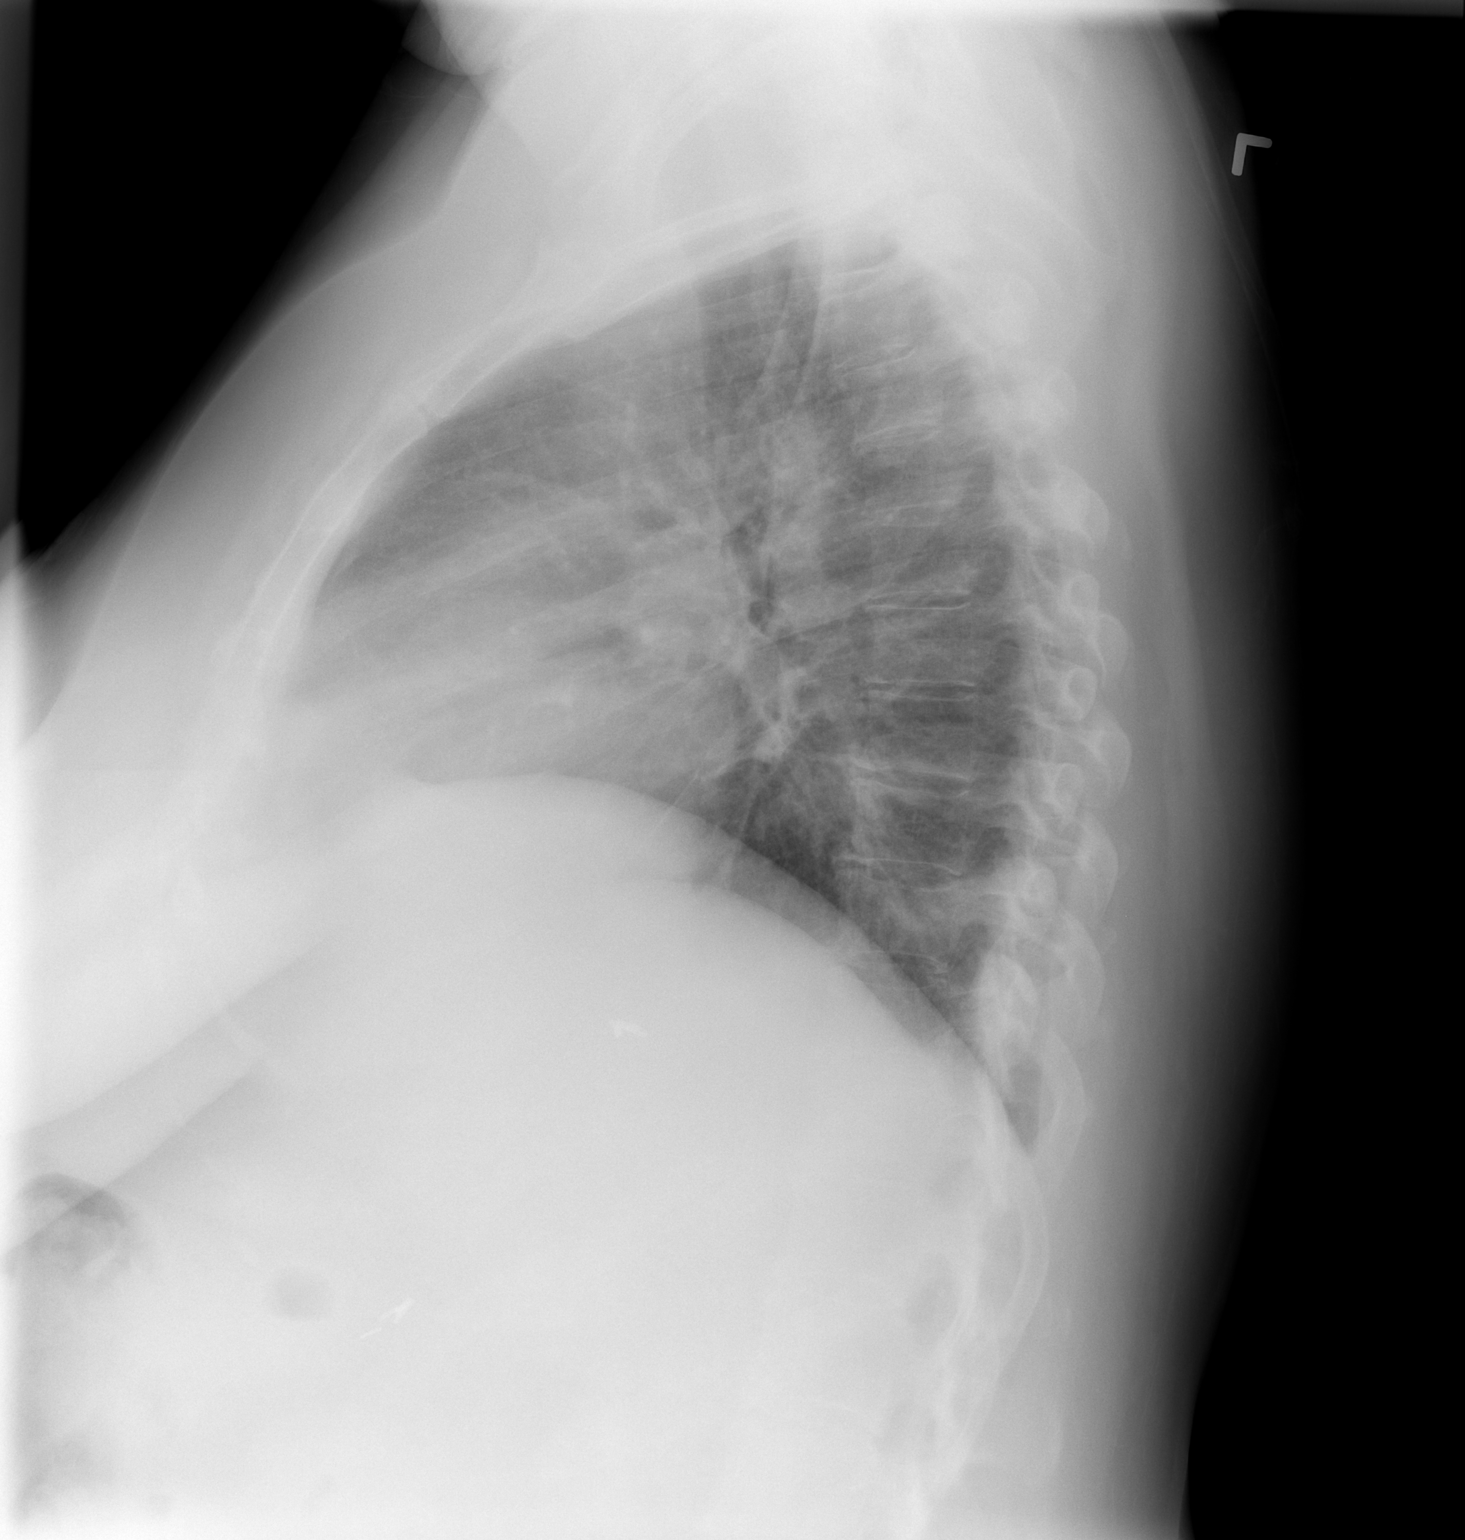

[2 of 2 positions shown; findings below may reference images not displayed]

FINDINGS: The lungs are clear.  Heart size is normal.  No
pneumothorax or pleural fluid.  Surgical clips at the
gastroesophageal junction noted.
IMPRESSION: No acute disease.

## 2014-02-21 ENCOUNTER — Encounter (HOSPITAL_COMMUNITY): Payer: BC Managed Care – PPO

## 2014-02-21 DIAGNOSIS — S92309A Fracture of unspecified metatarsal bone(s), unspecified foot, initial encounter for closed fracture: Secondary | ICD-10-CM | POA: Diagnosis not present

## 2014-02-22 ENCOUNTER — Other Ambulatory Visit: Payer: Self-pay | Admitting: Internal Medicine

## 2014-02-22 ENCOUNTER — Telehealth: Payer: Self-pay | Admitting: Internal Medicine

## 2014-02-22 ENCOUNTER — Other Ambulatory Visit: Payer: Self-pay | Admitting: *Deleted

## 2014-02-22 MED ORDER — FLUCONAZOLE 150 MG PO TABS: 150.0000 mg | ORAL_TABLET | Freq: Once | ORAL | Status: DC

## 2014-02-22 NOTE — Telephone Encounter (Signed)
Patients husband called in stating his wife has a yeast infection from the invokana  Per patients husband Dr Darnell Level stated if patient had any issues she would call in a Rx   Thank You

## 2014-02-22 NOTE — Telephone Encounter (Signed)
Called pt and advised her that an rx for Diflucan with one refill was sent to her pharmacy. Pt pleased. Be advised.

## 2014-02-22 NOTE — Telephone Encounter (Signed)
Diflucan 150 mg, 1 tablet only with one refill

## 2014-02-22 NOTE — Telephone Encounter (Signed)
Please advise in Dr Arman Filter absence. Thank you.

## 2014-02-26 ENCOUNTER — Encounter (HOSPITAL_COMMUNITY): Payer: BC Managed Care – PPO

## 2014-02-28 ENCOUNTER — Ambulatory Visit: Payer: BC Managed Care – PPO | Admitting: Internal Medicine

## 2014-02-28 ENCOUNTER — Encounter (HOSPITAL_COMMUNITY): Payer: BC Managed Care – PPO

## 2014-03-01 ENCOUNTER — Ambulatory Visit: Payer: BC Managed Care – PPO | Admitting: Critical Care Medicine

## 2014-03-01 ENCOUNTER — Ambulatory Visit (INDEPENDENT_AMBULATORY_CARE_PROVIDER_SITE_OTHER): Payer: BC Managed Care – PPO | Admitting: Critical Care Medicine

## 2014-03-01 ENCOUNTER — Other Ambulatory Visit: Payer: Self-pay | Admitting: Cardiology

## 2014-03-01 ENCOUNTER — Encounter: Payer: Self-pay | Admitting: Critical Care Medicine

## 2014-03-01 VITALS — BP 134/66 | HR 84 | Temp 97.7°F | Ht 66.0 in | Wt 268.6 lb

## 2014-03-01 DIAGNOSIS — D869 Sarcoidosis, unspecified: Secondary | ICD-10-CM

## 2014-03-01 DIAGNOSIS — D860 Sarcoidosis of lung: Secondary | ICD-10-CM

## 2014-03-01 DIAGNOSIS — J441 Chronic obstructive pulmonary disease with (acute) exacerbation: Secondary | ICD-10-CM

## 2014-03-01 DIAGNOSIS — J99 Respiratory disorders in diseases classified elsewhere: Secondary | ICD-10-CM

## 2014-03-01 MED ORDER — PREDNISONE 5 MG PO TABS: 10.0000 mg | ORAL_TABLET | Freq: Every morning | ORAL | Status: DC

## 2014-03-01 MED ORDER — AZITHROMYCIN 250 MG PO TABS: 250.0000 mg | ORAL_TABLET | Freq: Every day | ORAL | Status: DC

## 2014-03-01 MED ORDER — METHYLPREDNISOLONE ACETATE 80 MG/ML IJ SUSP
120.0000 mg | Freq: Once | INTRAMUSCULAR | Status: AC
  Administered 2014-03-01: 120 mg via INTRAMUSCULAR

## 2014-03-01 NOTE — Patient Instructions (Signed)
A depomedrol 120mg  IM injection was given Azithromycin 250mg  Take two once then one daily until gone No other medication changes Return 2 months

## 2014-03-01 NOTE — Progress Notes (Signed)
Subjective:    Patient ID: [name], female    DOB: [birth year], 53 y.o.   MRN: [id number]  HPI  53 y.o. F  with Sarcoid, GERD, VCD, cyclical cough  01/17/8118 Chief Complaint  Patient presents with  . 2 wk follow up    Breathing has slightly improved - still has increased SOB, increased cough with yellow mucus, and right sided chest pain.  No f/c/s.  Pt saw NP 02/12/14 and rx pred pulse.  Cough and dyspnea is better.  Cough is prod yellow mucus now.  Notes some wheezing.  Notes R sided chest pain     Review of Systems  Constitutional:   No  weight loss, night sweats,  Fevers, chills,  +fatigue, or  lassitude.  HEENT:   No headaches,  Difficulty swallowing,  Tooth/dental problems, or  Sore throat,                No sneezing, itching, ear ache,  +nasal congestion, post nasal drip,   CV:  No chest pain,  Orthopnea, PND, swelling in lower extremities, anasarca, dizziness, palpitations, syncope.   GI  No heartburn, indigestion, abdominal pain, nausea, vomiting, diarrhea, change in bowel habits, loss of appetite, bloody stools.   Resp:  ,  No coughing up of blood.   [name]  No chest wall deformity  Skin: no rash or lesions.  GU: no dysuria, change in color of urine, no urgency or frequency.  No flank pain, no hematuria   MS:  No joint pain or swelling.  No decreased range of motion.  No back pain.  Psych:  No change in mood or affect. No depression or anxiety.  No memory loss.     Objective:   Physical Exam BP 134/66  Pulse 84  Temp(Src) 97.7 F (36.5 C) (Oral)  Ht 5' 6"  (1.676 m)  Wt 268 lb 9.6 oz (121.836 kg)  BMI 43.37 kg/m2  SpO2 97%  GEN: A/Ox3; pleasant , NAD   HEENT:  [place]/AT,  EACs-clear, TMs-wnl, NOSE-clear drainage THROAT-clear, no lesions, no postnasal drip or exudate noted.   NECK:  Supple w/ fair ROM; no JVD; normal carotid impulses w/o bruits; no thyromegaly or nodules palpated; no lymphadenopathy.  RESP  Diminished BS in bases, upper airway  psuedowheezing ,  no dullness to percussion, exp wheezes  CARD:  RRR, no m/r/g  , no peripheral edema, pulses intact, no cyanosis or clubbing.  GI:   Soft & nt; nml bowel sounds; no organomegaly or masses detected.  Musco: Warm bil, no deformities or joint swelling noted.   Neuro: alert, no focal deficits noted.    Skin: Warm, no lesions or rashes     Assessment & Plan:   COPD exacerbation Recurrent copd exacerbation with asthmatic bronchitic component, acute flare Plan A depomedrol 161m IM injection was given Azithromycin 2526mTake two once then one daily until gone No other medication changes Return 2 months    Updated Medication List Outpatient Encounter Prescriptions as of 03/01/2014  Medication Sig  . albuterol (PROVENTIL HFA;VENTOLIN HFA) 108 (90 BASE) MCG/ACT inhaler Inhale 2 puffs into the lungs every 6 (six) hours as needed. For wheezing.  . Marland Kitchenlbuterol (PROVENTIL) (5 MG/ML) 0.5% nebulizer solution Take 2.5 mg by nebulization every 6 (six) hours as needed. For wheezing.  . Marland Kitchenllopurinol (ZYLOPRIM) 100 MG tablet Take 100 mg by mouth at bedtime.   . Marland Kitchenspirin EC 81 MG tablet Take 81 mg by mouth daily.   . Marland Kitchentorvastatin (LIPITOR) 40 MG  tablet Take 40 mg by mouth at bedtime.  . B-D ULTRAFINE III SHORT PEN 31G X 8 MM MISC Follow directions provided by physician  . benzonatate (TESSALON) 200 MG capsule Take 1 capsule (200 mg total) by mouth 3 (three) times daily.  . Blood Glucose Monitoring Suppl (FREESTYLE FREEDOM LITE) W/DEVICE KIT Use as adviced  . buPROPion (WELLBUTRIN XL) 300 MG 24 hr tablet Take 300 mg by mouth daily.  . Canagliflozin (INVOKANA) 300 MG TABS Take 300 mg by mouth daily in am.  . carvedilol (COREG) 25 MG tablet Take 1 tablet (25 mg total) by mouth 2 (two) times daily with a meal.  . chlorpheniramine-HYDROcodone (TUSSIONEX) 10-8 MG/5ML LQCR Take 5 mLs by mouth every 12 (twelve) hours as needed.  . cholecalciferol (VITAMIN D) 1000 UNITS tablet Take 1,000 Units  by mouth 2 (two) times daily.  . ciclesonide (OMNARIS) 50 MCG/ACT nasal spray Place 1 spray into both nostrils daily.   [name] DEXILANT 60 MG capsule Take one capsule by mouth one time daily  . dextromethorphan-guaiFENesin (MUCINEX DM) 30-600 MG per 12 hr tablet Take 1 tablet by mouth 2 (two) times daily.  [name] diltiazem (CARDIZEM CD) 120 MG 24 hr capsule Take 1 capsule (120 mg total) by mouth every evening.  [name] estradiol (ESTRACE) 2 MG tablet Take 2 mg by mouth at bedtime.   . fenofibrate (TRICOR) 145 MG tablet Take one tablet by mouth one time daily  . fluticasone (FLONASE) 50 MCG/ACT nasal spray Place 2 sprays into both nostrils daily.  . furosemide (LASIX) 40 MG tablet Take 1 tablet (40 mg total) by mouth 2 (two) times daily. Take additional 40 mg in AM of 7/15 & 7/16 - to shoot for dry wgt of ~2 lb below current weight.  [name] glipiZIDE (GLIPIZIDE XL) 10 MG 24 hr tablet Take 1 tablet (10 mg total) by mouth daily with breakfast.  . glucose blood (ONE TOUCH ULTRA TEST) test strip 1 each by Other route 3 (three) times daily. Use as instructed  . glyBURIDE (DIABETA) 2.5 MG tablet Take 2.5 mg by mouth daily with breakfast.   . insulin glargine (LANTUS) 100 UNIT/ML injection Inject 23 Units into the skin at bedtime.  [name] LORazepam (ATIVAN) 0.5 MG tablet Take 1 tablet (0.5 mg total) by mouth 2 (two) times daily as needed for anxiety.  [name] losartan (COZAAR) 100 MG tablet Take 100 mg by mouth at bedtime.  . metFORMIN (GLUCOPHAGE) 1000 MG tablet Take 1 tablet (1,000 mg total) by mouth 2 (two) times daily with a meal.  . mometasone-formoterol (DULERA) 200-5 MCG/ACT AERO Inhale 2 puffs into the lungs 2 (two) times daily.  . nitroGLYCERIN (NITROSTAT) 0.4 MG SL tablet Place 0.4 mg under the tongue every 5 (five) minutes as needed. x3 doses as needed for chest pain.  [name] nystatin-triamcinolone ointment (MYCOLOG) Apply 1 application topically 2 (two) times daily.  [name] omeprazole (PRILOSEC) 20 MG capsule Take 1 capsule (20 mg  total) by mouth 2 (two) times daily before a meal.  . PARoxetine Mesylate (BRISDELLE) 7.5 MG CAPS Take 7.5 mg by mouth daily.  . potassium chloride SA (K-DUR,KLOR-CON) 20 MEQ tablet TAKE ONE TABLET BY MOUTH THREE TIMES DAILY   . predniSONE (DELTASONE) 5 MG tablet Take 2 tablets (10 mg total) by mouth every morning.  . sucralfate (CARAFATE) 1 GM/10ML suspension Take 10 mLs (1 g total) by mouth 4 (four) times daily -  with meals and at bedtime.  [name] venlafaxine XR (EFFEXOR-XR) 75 MG 24  hr capsule Take 150 mg by mouth at bedtime.  . [DISCONTINUED] chlorpheniramine-HYDROcodone (TUSSIONEX) 10-8 MG/5ML LQCR Take 5 mLs by mouth every 12 (twelve) hours.  . [DISCONTINUED] predniSONE (DELTASONE) 5 MG tablet Take 10 mg by mouth every morning.   [name] azithromycin (ZITHROMAX) 250 MG tablet Take 1 tablet (250 mg total) by mouth daily. Take two once then one daily until gone  . [DISCONTINUED] fluconazole (DIFLUCAN) 150 MG tablet Take 1 tablet (150 mg total) by mouth once.  . [DISCONTINUED] potassium chloride SA (K-DUR,KLOR-CON) 20 MEQ tablet Take 30 mEq by mouth 2 (two) times daily.  . [EXPIRED] methylPREDNISolone acetate (DEPO-MEDROL) injection 120 mg

## 2014-03-01 NOTE — Assessment & Plan Note (Signed)
Recurrent copd exacerbation with asthmatic bronchitic component, acute flare Plan A depomedrol 120mg  IM injection was given Azithromycin 250mg  Take two once then one daily until gone No other medication changes Return 2 months

## 2014-03-02 NOTE — Telephone Encounter (Signed)
Rx was sent to pharmacy electronically. 

## 2014-03-13 ENCOUNTER — Observation Stay (HOSPITAL_COMMUNITY)
Admission: EM | Admit: 2014-03-13 | Discharge: 2014-03-16 | Disposition: A | Discharge status: Home / Self Care | Payer: BC Managed Care – PPO | Attending: Internal Medicine | Admitting: Internal Medicine

## 2014-03-13 ENCOUNTER — Encounter (HOSPITAL_COMMUNITY): Payer: Self-pay | Admitting: Emergency Medicine

## 2014-03-13 ENCOUNTER — Emergency Department (HOSPITAL_COMMUNITY): Payer: BC Managed Care – PPO

## 2014-03-13 DIAGNOSIS — E1142 Type 2 diabetes mellitus with diabetic polyneuropathy: Secondary | ICD-10-CM | POA: Insufficient documentation

## 2014-03-13 DIAGNOSIS — Z794 Long term (current) use of insulin: Secondary | ICD-10-CM | POA: Insufficient documentation

## 2014-03-13 DIAGNOSIS — R05 Cough: Secondary | ICD-10-CM | POA: Diagnosis present

## 2014-03-13 DIAGNOSIS — J441 Chronic obstructive pulmonary disease with (acute) exacerbation: Principal | ICD-10-CM

## 2014-03-13 DIAGNOSIS — E1165 Type 2 diabetes mellitus with hyperglycemia: Secondary | ICD-10-CM

## 2014-03-13 DIAGNOSIS — D649 Anemia, unspecified: Secondary | ICD-10-CM | POA: Insufficient documentation

## 2014-03-13 DIAGNOSIS — I152 Hypertension secondary to endocrine disorders: Secondary | ICD-10-CM | POA: Diagnosis present

## 2014-03-13 DIAGNOSIS — I5032 Chronic diastolic (congestive) heart failure: Secondary | ICD-10-CM

## 2014-03-13 DIAGNOSIS — I509 Heart failure, unspecified: Secondary | ICD-10-CM | POA: Insufficient documentation

## 2014-03-13 DIAGNOSIS — J99 Respiratory disorders in diseases classified elsewhere: Secondary | ICD-10-CM | POA: Insufficient documentation

## 2014-03-13 DIAGNOSIS — D869 Sarcoidosis, unspecified: Secondary | ICD-10-CM

## 2014-03-13 DIAGNOSIS — E1149 Type 2 diabetes mellitus with other diabetic neurological complication: Secondary | ICD-10-CM | POA: Insufficient documentation

## 2014-03-13 DIAGNOSIS — J189 Pneumonia, unspecified organism: Secondary | ICD-10-CM

## 2014-03-13 DIAGNOSIS — R0600 Dyspnea, unspecified: Secondary | ICD-10-CM

## 2014-03-13 DIAGNOSIS — K219 Gastro-esophageal reflux disease without esophagitis: Secondary | ICD-10-CM

## 2014-03-13 DIAGNOSIS — E1129 Type 2 diabetes mellitus with other diabetic kidney complication: Secondary | ICD-10-CM | POA: Diagnosis present

## 2014-03-13 DIAGNOSIS — Z7982 Long term (current) use of aspirin: Secondary | ICD-10-CM | POA: Insufficient documentation

## 2014-03-13 DIAGNOSIS — R053 Chronic cough: Secondary | ICD-10-CM | POA: Diagnosis present

## 2014-03-13 DIAGNOSIS — G4730 Sleep apnea, unspecified: Secondary | ICD-10-CM | POA: Diagnosis present

## 2014-03-13 DIAGNOSIS — IMO0002 Reserved for concepts with insufficient information to code with codable children: Secondary | ICD-10-CM

## 2014-03-13 DIAGNOSIS — J383 Other diseases of vocal cords: Secondary | ICD-10-CM

## 2014-03-13 DIAGNOSIS — I10 Essential (primary) hypertension: Secondary | ICD-10-CM

## 2014-03-13 DIAGNOSIS — I110 Hypertensive heart disease with heart failure: Secondary | ICD-10-CM | POA: Diagnosis present

## 2014-03-13 DIAGNOSIS — J45901 Unspecified asthma with (acute) exacerbation: Principal | ICD-10-CM | POA: Insufficient documentation

## 2014-03-13 DIAGNOSIS — E669 Obesity, unspecified: Secondary | ICD-10-CM | POA: Insufficient documentation

## 2014-03-13 DIAGNOSIS — Z6841 Body Mass Index (BMI) 40.0 and over, adult: Secondary | ICD-10-CM | POA: Insufficient documentation

## 2014-03-13 DIAGNOSIS — D860 Sarcoidosis of lung: Secondary | ICD-10-CM

## 2014-03-13 DIAGNOSIS — E785 Hyperlipidemia, unspecified: Secondary | ICD-10-CM | POA: Insufficient documentation

## 2014-03-13 DIAGNOSIS — M109 Gout, unspecified: Secondary | ICD-10-CM | POA: Insufficient documentation

## 2014-03-13 DIAGNOSIS — E1159 Type 2 diabetes mellitus with other circulatory complications: Secondary | ICD-10-CM | POA: Diagnosis present

## 2014-03-13 LAB — CBC WITH DIFFERENTIAL/PLATELET
Basophils Absolute: 0 10*3/uL (ref 0.0–0.1)
Basophils Relative: 0 % (ref 0–1)
Eosinophils Absolute: 0.1 10*3/uL (ref 0.0–0.7)
Eosinophils Relative: 1 % (ref 0–5)
HCT: 36.4 % (ref 36.0–46.0)
Hemoglobin: 12.2 g/dL (ref 12.0–15.0)
Lymphocytes Relative: 21 % (ref 12–46)
Lymphs Abs: 3.2 10*3/uL (ref 0.7–4.0)
MCH: 29.5 pg (ref 26.0–34.0)
MCHC: 33.5 g/dL (ref 30.0–36.0)
MCV: 87.9 fL (ref 78.0–100.0)
Monocytes Absolute: 0.8 10*3/uL (ref 0.1–1.0)
Monocytes Relative: 5 % (ref 3–12)
Neutro Abs: 11 10*3/uL — ABNORMAL HIGH (ref 1.7–7.7)
Neutrophils Relative %: 73 % (ref 43–77)
Platelets: 382 10*3/uL (ref 150–400)
RBC: 4.14 MIL/uL (ref 3.87–5.11)
RDW: 14 % (ref 11.5–15.5)
WBC: 15 10*3/uL — ABNORMAL HIGH (ref 4.0–10.5)

## 2014-03-13 LAB — TROPONIN I: Troponin I: <0.3 ng/mL (ref <0.30)

## 2014-03-13 LAB — BASIC METABOLIC PANEL
BUN: 20 mg/dL (ref 6–23)
CO2: 22 mEq/L (ref 19–32)
Calcium: 9.8 mg/dL (ref 8.4–10.5)
Chloride: 96 mEq/L (ref 96–112)
Creatinine, Ser: 0.71 mg/dL (ref 0.50–1.10)
GFR calc Af Amer: >90 mL/min (ref >90)
GFR calc non Af Amer: >90 mL/min (ref >90)
Glucose, Bld: 319 mg/dL — ABNORMAL HIGH (ref 70–99)
Potassium: 3.6 mEq/L — ABNORMAL LOW (ref 3.7–5.3)
Sodium: 136 mEq/L — ABNORMAL LOW (ref 137–147)

## 2014-03-13 LAB — PRO B NATRIURETIC PEPTIDE: Pro B Natriuretic peptide (BNP): 95.1 pg/mL (ref 0–125)

## 2014-03-13 LAB — MAGNESIUM: Magnesium: 1.7 mg/dL (ref 1.5–2.5)

## 2014-03-13 MED ORDER — ALBUTEROL (5 MG/ML) CONTINUOUS INHALATION SOLN
10.0000 mg/h | INHALATION_SOLUTION | Freq: Once | RESPIRATORY_TRACT | Status: AC
  Administered 2014-03-13: 10 mg/h via RESPIRATORY_TRACT
  Filled 2014-03-13: qty 20

## 2014-03-13 MED ORDER — ALBUTEROL SULFATE (2.5 MG/3ML) 0.083% IN NEBU
5.0000 mg | INHALATION_SOLUTION | Freq: Once | RESPIRATORY_TRACT | Status: AC
  Administered 2014-03-13: 5 mg via RESPIRATORY_TRACT
  Filled 2014-03-13: qty 6

## 2014-03-13 MED ORDER — IPRATROPIUM-ALBUTEROL 0.5-2.5 (3) MG/3ML IN SOLN
3.0000 mL | Freq: Once | RESPIRATORY_TRACT | Status: AC
  Administered 2014-03-13: 3 mL via RESPIRATORY_TRACT
  Filled 2014-03-13: qty 3

## 2014-03-13 MED ORDER — IPRATROPIUM BROMIDE 0.02 % IN SOLN
0.5000 mg | Freq: Once | RESPIRATORY_TRACT | Status: AC
  Administered 2014-03-13: 0.5 mg via RESPIRATORY_TRACT
  Filled 2014-03-13: qty 2.5

## 2014-03-13 MED ORDER — MAGNESIUM SULFATE 40 MG/ML IJ SOLN
2.0000 g | Freq: Once | INTRAMUSCULAR | Status: AC
  Administered 2014-03-13: 2 g via INTRAVENOUS
  Filled 2014-03-13: qty 50

## 2014-03-13 MED ORDER — METHYLPREDNISOLONE SODIUM SUCC 125 MG IJ SOLR
125.0000 mg | Freq: Once | INTRAMUSCULAR | Status: AC
  Administered 2014-03-13: 125 mg via INTRAVENOUS
  Filled 2014-03-13: qty 2

## 2014-03-13 NOTE — ED Notes (Signed)
Pt state so forgot to mention to the Dr. Parks Ranger she has pain in the left side of her neck.

## 2014-03-13 NOTE — ED Notes (Signed)
The pt is c/o sob today audible wheezes at present.  She has sarcoidosis her inhaler and her hhn has not helped

## 2014-03-13 NOTE — ED Notes (Signed)
[name] off to Xray

## 2014-03-13 NOTE — ED Notes (Signed)
Pt was given ice chips ok'd by Dr. Reather Converse.

## 2014-03-13 NOTE — ED Notes (Signed)
Patient transported to X-ray 

## 2014-03-14 ENCOUNTER — Encounter (HOSPITAL_COMMUNITY): Payer: Self-pay | Admitting: General Practice

## 2014-03-14 DIAGNOSIS — I5032 Chronic diastolic (congestive) heart failure: Secondary | ICD-10-CM

## 2014-03-14 DIAGNOSIS — J441 Chronic obstructive pulmonary disease with (acute) exacerbation: Secondary | ICD-10-CM

## 2014-03-14 DIAGNOSIS — E1149 Type 2 diabetes mellitus with other diabetic neurological complication: Secondary | ICD-10-CM

## 2014-03-14 DIAGNOSIS — D869 Sarcoidosis, unspecified: Secondary | ICD-10-CM

## 2014-03-14 DIAGNOSIS — I10 Essential (primary) hypertension: Secondary | ICD-10-CM

## 2014-03-14 DIAGNOSIS — K219 Gastro-esophageal reflux disease without esophagitis: Secondary | ICD-10-CM

## 2014-03-14 DIAGNOSIS — J383 Other diseases of vocal cords: Secondary | ICD-10-CM

## 2014-03-14 DIAGNOSIS — J189 Pneumonia, unspecified organism: Secondary | ICD-10-CM

## 2014-03-14 DIAGNOSIS — J99 Respiratory disorders in diseases classified elsewhere: Secondary | ICD-10-CM

## 2014-03-14 LAB — CBC
HCT: 36.3 % (ref 36.0–46.0)
Hemoglobin: 11.9 g/dL — ABNORMAL LOW (ref 12.0–15.0)
MCH: 29.2 pg (ref 26.0–34.0)
MCHC: 32.8 g/dL (ref 30.0–36.0)
MCV: 89 fL (ref 78.0–100.0)
Platelets: 352 10*3/uL (ref 150–400)
RBC: 4.08 MIL/uL (ref 3.87–5.11)
RDW: 14 % (ref 11.5–15.5)
WBC: 15 10*3/uL — ABNORMAL HIGH (ref 4.0–10.5)

## 2014-03-14 LAB — COMPREHENSIVE METABOLIC PANEL
ALT: 16 U/L (ref 0–35)
AST: 13 U/L (ref 0–37)
Albumin: 3.7 g/dL (ref 3.5–5.2)
Alkaline Phosphatase: 70 U/L (ref 39–117)
BUN: 19 mg/dL (ref 6–23)
CO2: 23 mEq/L (ref 19–32)
Calcium: 9.2 mg/dL (ref 8.4–10.5)
Chloride: 97 mEq/L (ref 96–112)
Creatinine, Ser: 0.64 mg/dL (ref 0.50–1.10)
GFR calc Af Amer: >90 mL/min (ref >90)
GFR calc non Af Amer: >90 mL/min (ref >90)
Glucose, Bld: 417 mg/dL — ABNORMAL HIGH (ref 70–99)
Potassium: 3.5 mEq/L — ABNORMAL LOW (ref 3.7–5.3)
Sodium: 142 mEq/L (ref 137–147)
Total Bilirubin: 0.3 mg/dL (ref 0.3–1.2)
Total Protein: 7 g/dL (ref 6.0–8.3)

## 2014-03-14 LAB — GLUCOSE, CAPILLARY
Glucose-Capillary: 406 mg/dL — ABNORMAL HIGH (ref 70–99)
Glucose-Capillary: 316 mg/dL — ABNORMAL HIGH (ref 70–99)
Glucose-Capillary: 328 mg/dL — ABNORMAL HIGH (ref 70–99)
Glucose-Capillary: 303 mg/dL — ABNORMAL HIGH (ref 70–99)

## 2014-03-14 LAB — EXPECTORATED SPUTUM ASSESSMENT W GRAM STAIN, RFLX TO RESP C

## 2014-03-14 LAB — PROTIME-INR
INR: 1.04 (ref 0.00–1.49)
Prothrombin Time: 13.4 seconds (ref 11.6–15.2)

## 2014-03-14 MED ORDER — INSULIN ASPART 100 UNIT/ML ~~LOC~~ SOLN
0.0000 [IU] | Freq: Three times a day (TID) | SUBCUTANEOUS | Status: DC
  Administered 2014-03-14: 15 [IU] via SUBCUTANEOUS
  Administered 2014-03-14 (×2): 11 [IU] via SUBCUTANEOUS
  Administered 2014-03-15: 8 [IU] via SUBCUTANEOUS
  Administered 2014-03-15: 5 [IU] via SUBCUTANEOUS

## 2014-03-14 MED ORDER — INSULIN GLARGINE 100 UNIT/ML ~~LOC~~ SOLN
30.0000 [IU] | Freq: Every day | SUBCUTANEOUS | Status: DC
  Administered 2014-03-14 – 2014-03-15 (×2): 30 [IU] via SUBCUTANEOUS
  Filled 2014-03-14 (×3): qty 0.3

## 2014-03-14 MED ORDER — IPRATROPIUM-ALBUTEROL 0.5-2.5 (3) MG/3ML IN SOLN
3.0000 mL | RESPIRATORY_TRACT | Status: DC
  Administered 2014-03-14 (×4): 3 mL via RESPIRATORY_TRACT
  Filled 2014-03-14 (×4): qty 3

## 2014-03-14 MED ORDER — CARVEDILOL 25 MG PO TABS
25.0000 mg | ORAL_TABLET | Freq: Two times a day (BID) | ORAL | Status: DC
  Administered 2014-03-14 – 2014-03-16 (×5): 25 mg via ORAL
  Filled 2014-03-14 (×7): qty 1

## 2014-03-14 MED ORDER — ASPIRIN EC 81 MG PO TBEC
81.0000 mg | DELAYED_RELEASE_TABLET | Freq: Every day | ORAL | Status: DC
  Administered 2014-03-14 – 2014-03-16 (×3): 81 mg via ORAL
  Filled 2014-03-14 (×3): qty 1

## 2014-03-14 MED ORDER — BUPROPION HCL ER (XL) 300 MG PO TB24
300.0000 mg | ORAL_TABLET | Freq: Every day | ORAL | Status: DC
  Administered 2014-03-14 – 2014-03-16 (×3): 300 mg via ORAL
  Filled 2014-03-14 (×4): qty 1

## 2014-03-14 MED ORDER — ACETAMINOPHEN 650 MG RE SUPP: 650.0000 mg | Freq: Four times a day (QID) | RECTAL | Status: DC | PRN

## 2014-03-14 MED ORDER — VENLAFAXINE HCL ER 150 MG PO CP24
150.0000 mg | ORAL_CAPSULE | Freq: Every day | ORAL | Status: DC
  Administered 2014-03-14 – 2014-03-15 (×2): 150 mg via ORAL
  Filled 2014-03-14 (×4): qty 1

## 2014-03-14 MED ORDER — LORAZEPAM 0.5 MG PO TABS
0.5000 mg | ORAL_TABLET | Freq: Two times a day (BID) | ORAL | Status: DC | PRN
  Administered 2014-03-14 – 2014-03-15 (×2): 0.5 mg via ORAL
  Filled 2014-03-14 (×2): qty 1

## 2014-03-14 MED ORDER — FUROSEMIDE 40 MG PO TABS
40.0000 mg | ORAL_TABLET | Freq: Two times a day (BID) | ORAL | Status: DC
  Administered 2014-03-14 – 2014-03-16 (×5): 40 mg via ORAL
  Filled 2014-03-14 (×7): qty 1

## 2014-03-14 MED ORDER — SODIUM CHLORIDE 0.9 % IJ SOLN
3.0000 mL | Freq: Two times a day (BID) | INTRAMUSCULAR | Status: DC
  Administered 2014-03-14 – 2014-03-16 (×5): 3 mL via INTRAVENOUS

## 2014-03-14 MED ORDER — FLUTICASONE PROPIONATE 50 MCG/ACT NA SUSP
2.0000 | Freq: Every day | NASAL | Status: DC | PRN
  Filled 2014-03-14: qty 16

## 2014-03-14 MED ORDER — ATORVASTATIN CALCIUM 40 MG PO TABS
40.0000 mg | ORAL_TABLET | Freq: Every day | ORAL | Status: DC
  Administered 2014-03-14 – 2014-03-15 (×2): 40 mg via ORAL
  Filled 2014-03-14 (×3): qty 1

## 2014-03-14 MED ORDER — ONDANSETRON HCL 4 MG PO TABS: 4.0000 mg | ORAL_TABLET | Freq: Four times a day (QID) | ORAL | Status: DC | PRN

## 2014-03-14 MED ORDER — POTASSIUM CHLORIDE CRYS ER 10 MEQ PO TBCR
10.0000 meq | EXTENDED_RELEASE_TABLET | Freq: Three times a day (TID) | ORAL | Status: DC
  Administered 2014-03-14 – 2014-03-16 (×7): 10 meq via ORAL
  Filled 2014-03-14 (×9): qty 1

## 2014-03-14 MED ORDER — LEVOFLOXACIN IN D5W 750 MG/150ML IV SOLN
750.0000 mg | INTRAVENOUS | Status: DC
  Administered 2014-03-14 – 2014-03-16 (×3): 750 mg via INTRAVENOUS
  Filled 2014-03-14 (×3): qty 150

## 2014-03-14 MED ORDER — HYDROCOD POLST-CHLORPHEN POLST 10-8 MG/5ML PO LQCR: 5.0000 mL | Freq: Two times a day (BID) | ORAL | Status: DC | PRN

## 2014-03-14 MED ORDER — ACETAMINOPHEN 325 MG PO TABS
650.0000 mg | ORAL_TABLET | Freq: Four times a day (QID) | ORAL | Status: DC | PRN
  Administered 2014-03-14 (×2): 650 mg via ORAL
  Filled 2014-03-14 (×2): qty 2

## 2014-03-14 MED ORDER — ONDANSETRON HCL 4 MG/2ML IJ SOLN: 4.0000 mg | Freq: Four times a day (QID) | INTRAMUSCULAR | Status: DC | PRN

## 2014-03-14 MED ORDER — PAROXETINE HCL 10 MG PO TABS
10.0000 mg | ORAL_TABLET | Freq: Every day | ORAL | Status: DC
  Administered 2014-03-14 – 2014-03-16 (×3): 10 mg via ORAL
  Filled 2014-03-14 (×3): qty 1

## 2014-03-14 MED ORDER — PANTOPRAZOLE SODIUM 40 MG PO TBEC
40.0000 mg | DELAYED_RELEASE_TABLET | Freq: Every day | ORAL | Status: DC
  Administered 2014-03-14 – 2014-03-16 (×3): 40 mg via ORAL
  Filled 2014-03-14 (×3): qty 1

## 2014-03-14 MED ORDER — INSULIN ASPART 100 UNIT/ML ~~LOC~~ SOLN
0.0000 [IU] | Freq: Every day | SUBCUTANEOUS | Status: DC
  Administered 2014-03-14: 4 [IU] via SUBCUTANEOUS

## 2014-03-14 MED ORDER — ALBUTEROL SULFATE (2.5 MG/3ML) 0.083% IN NEBU: 2.5000 mg | INHALATION_SOLUTION | RESPIRATORY_TRACT | Status: AC | PRN

## 2014-03-14 MED ORDER — ESTRADIOL 2 MG PO TABS
2.0000 mg | ORAL_TABLET | Freq: Every day | ORAL | Status: DC
  Administered 2014-03-14 – 2014-03-15 (×2): 2 mg via ORAL
  Filled 2014-03-14 (×3): qty 1

## 2014-03-14 MED ORDER — LOSARTAN POTASSIUM 50 MG PO TABS
100.0000 mg | ORAL_TABLET | Freq: Every day | ORAL | Status: DC
  Administered 2014-03-14 – 2014-03-16 (×3): 100 mg via ORAL
  Filled 2014-03-14 (×3): qty 2

## 2014-03-14 MED ORDER — INSULIN GLARGINE 100 UNIT/ML ~~LOC~~ SOLN
23.0000 [IU] | Freq: Every day | SUBCUTANEOUS | Status: DC
  Filled 2014-03-14: qty 0.23

## 2014-03-14 MED ORDER — DILTIAZEM HCL ER COATED BEADS 120 MG PO CP24
120.0000 mg | ORAL_CAPSULE | Freq: Every evening | ORAL | Status: DC
  Administered 2014-03-14 – 2014-03-15 (×2): 120 mg via ORAL
  Filled 2014-03-14 (×4): qty 1

## 2014-03-14 MED ORDER — ENOXAPARIN SODIUM 40 MG/0.4ML ~~LOC~~ SOLN
40.0000 mg | SUBCUTANEOUS | Status: DC
  Administered 2014-03-14 – 2014-03-16 (×3): 40 mg via SUBCUTANEOUS
  Filled 2014-03-14 (×3): qty 0.4

## 2014-03-14 MED ORDER — PAROXETINE MESYLATE 7.5 MG PO CAPS: 7.5000 mg | ORAL_CAPSULE | Freq: Every day | ORAL | Status: DC

## 2014-03-14 MED ORDER — MOMETASONE FURO-FORMOTEROL FUM 200-5 MCG/ACT IN AERO
2.0000 | INHALATION_SPRAY | Freq: Two times a day (BID) | RESPIRATORY_TRACT | Status: DC
  Administered 2014-03-14 – 2014-03-16 (×5): 2 via RESPIRATORY_TRACT
  Filled 2014-03-14: qty 8.8

## 2014-03-14 MED ORDER — ALLOPURINOL 100 MG PO TABS
100.0000 mg | ORAL_TABLET | Freq: Every day | ORAL | Status: DC
  Administered 2014-03-14 – 2014-03-15 (×2): 100 mg via ORAL
  Filled 2014-03-14 (×3): qty 1

## 2014-03-14 MED ORDER — IPRATROPIUM-ALBUTEROL 0.5-2.5 (3) MG/3ML IN SOLN
3.0000 mL | Freq: Three times a day (TID) | RESPIRATORY_TRACT | Status: DC
  Administered 2014-03-15 – 2014-03-16 (×4): 3 mL via RESPIRATORY_TRACT
  Filled 2014-03-14 (×4): qty 3

## 2014-03-14 MED ORDER — ALBUTEROL SULFATE (2.5 MG/3ML) 0.083% IN NEBU: 2.5000 mg | INHALATION_SOLUTION | RESPIRATORY_TRACT | Status: DC | PRN

## 2014-03-14 MED ORDER — GUAIFENESIN ER 600 MG PO TB12
1200.0000 mg | ORAL_TABLET | Freq: Two times a day (BID) | ORAL | Status: DC
  Administered 2014-03-14 – 2014-03-16 (×5): 1200 mg via ORAL
  Filled 2014-03-14 (×7): qty 2

## 2014-03-14 MED ORDER — SODIUM CHLORIDE 0.9 % IV SOLN
INTRAVENOUS | Status: AC
Start: 2014-03-14 — End: 2014-03-14
  Administered 2014-03-14: 01:00:00 via INTRAVENOUS

## 2014-03-14 MED ORDER — NITROGLYCERIN 0.4 MG SL SUBL: 0.4000 mg | SUBLINGUAL_TABLET | SUBLINGUAL | Status: DC | PRN

## 2014-03-14 MED ORDER — PREDNISONE 50 MG PO TABS
60.0000 mg | ORAL_TABLET | Freq: Every day | ORAL | Status: DC
  Administered 2014-03-14 – 2014-03-16 (×3): 60 mg via ORAL
  Filled 2014-03-14 (×4): qty 1

## 2014-03-14 MED ORDER — SUCRALFATE 1 GM/10ML PO SUSP
1.0000 g | Freq: Three times a day (TID) | ORAL | Status: DC
  Administered 2014-03-14 – 2014-03-16 (×10): 1 g via ORAL
  Filled 2014-03-14 (×13): qty 10

## 2014-03-14 MED ORDER — ALBUTEROL (5 MG/ML) CONTINUOUS INHALATION SOLN
10.0000 mg/h | INHALATION_SOLUTION | Freq: Once | RESPIRATORY_TRACT | Status: AC
  Administered 2014-03-14: 10 mg/h via RESPIRATORY_TRACT

## 2014-03-14 NOTE — Progress Notes (Signed)
UR completed 

## 2014-03-14 NOTE — ED Provider Notes (Signed)
CSN: [id number]     Arrival date & time 03/13/14  2025 History   First MD Initiated Contact with Patient 03/13/14 2117     Chief Complaint  Patient presents with  . Shortness of Breath     (Consider location/radiation/quality/duration/timing/severity/associated sxs/prior Treatment) HPI Comments: 53 year old female with history of COPD, sarcoid, lipids, high blood pressure, sleep apnea and diabetes, diastolic heart failure presents with gradually worsening shortness of breath for the past 2-3 days. This is similar to her COPD exacerbation/sarcoid history. Minimal improvement with her nebs at home. Mild tightness bilateral nonradiating. Patient has had mild weight in the past 2 weeks with no increased leg swelling. No fevers or productive cough. Patient on prednisone daily for sarcoid. Nonsmoker  Patient is a 53 y.o. female presenting with shortness of breath. The history is provided by the patient.  Shortness of Breath Associated symptoms: cough   Associated symptoms: no abdominal pain, no chest pain, no fever, no headaches, no neck pain, no rash and no vomiting     Past Medical History  Diagnosis Date  . DIABETES MELLITUS, TYPE II 08/21/2006  . HYPERLIPIDEMIA 08/21/2006  . GOUT 08/20/2010  . OBESITY 06/04/2009  . ANEMIA-UNSPECIFIED 09/18/2009  . HYPERTENSION 08/21/2006  . GERD 08/21/2006  . SLEEP APNEA 04/21/2009    last testing was negative  . Internal hemorrhoids   . Pulmonary sarcoidosis     Followed locally by pulmonology, but also by Dr. Casper Harrison at [facility]  . Exertional chest pain     sharp, substernal, exertional  . Vocal cord dysfunction   . Chronic diastolic heart failure, NYHA class 2     LVEDP roughly 20% by cath  . CHF (congestive heart failure)    Past Surgical History  Procedure Laterality Date  . Ventral hernia repair    . Nissen fundoplication  5284  . Cholecystectomy  1984  . Abdominal hysterectomy    . Knee arthroscopy      right  . Tubal  ligation      with reversal in 1994  . Bladder suspension  11/11/2011    Procedure: TRANSVAGINAL TAPE (TVT) PROCEDURE;  Surgeon: Olga Millers, MD;  Location: [facility];  Service: Gynecology;  Laterality: N/A;  . Cystoscopy  11/11/2011    Procedure: CYSTOSCOPY;  Surgeon: Olga Millers, MD;  Location: [facility];  Service: Gynecology;  Laterality: N/A;  . Doppler echocardiography  02/12/2013    LV FUNCTION, SIZE NORMAL; MILD CONCENTRIC LVH; EST EF 55-65%; WALL MOTION NORMAL  . Lexiscan myoview  03/09/2013    EF 50%; NORMAL MYOCARDIAL PERFUSION STUDY - breast attenuation  . Cardiac catheterization  07/2010    LVEF 50-55% WITH VERY MILD GLOBAL HYPOKINESIA; ESSENTIALLY NORMAL CORONARY ARTERIES; NORMAL LV FUNCTION  . Carotids  02/18/11    CAROTID DUPLEX; VERTEBRALS ARE PATENT WITH ANTEGRADE FLOW. ICA/CCA RATIO 1.61 ON RIGHT AND 0.75 ON LEFT  . Right and left cardiac catheterization  04/23/2013    Angiographic normal coronaries; LVEDP 20 mmHg, PCWP 12-14 mmHg, RAP 12 mmHg.; Fick CO/CI 4.9/2.2   Family History  Problem Relation Age of Onset  . Diabetes Father   . Heart attack Father   . Coronary artery disease Father   . Heart failure Father   . COPD Mother   . Emphysema Mother   . Asthma Mother   . Heart failure Mother   . Sarcoidosis Maternal Uncle   . Colon cancer Neg Hx   . Lung cancer Brother   . Cancer  Brother   . Diabetes Brother   . Heart attack Maternal Grandfather    History  Substance Use Topics  . Smoking status: Never Smoker   . Smokeless tobacco: Never Used  . Alcohol Use: No   OB History   Grav Para Term Preterm Abortions TAB SAB Ect Mult Living                 Review of Systems  Constitutional: Negative for fever and chills.  HENT: Negative for congestion.   Eyes: Negative for visual disturbance.  Respiratory: Positive for cough, chest tightness and shortness of breath.   Cardiovascular: Negative for chest pain.  Gastrointestinal: Negative for vomiting and abdominal  pain.  Genitourinary: Negative for dysuria and flank pain.  Musculoskeletal: Negative for back pain, neck pain and neck stiffness.  Skin: Negative for rash.  Neurological: Negative for light-headedness and headaches.      Allergies  Lisinopril; Methotrexate; and Clindamycin/lincomycin  Home Medications   Prior to Admission medications   Medication Sig Start Date End Date Taking? Authorizing Provider  albuterol (PROVENTIL HFA;VENTOLIN HFA) 108 (90 BASE) MCG/ACT inhaler Inhale 2 puffs into the lungs every 6 (six) hours as needed. For wheezing.   Yes Historical Provider, MD  albuterol (PROVENTIL) (5 MG/ML) 0.5% nebulizer solution Take 2.5 mg by nebulization every 6 (six) hours as needed. For wheezing.   Yes Historical Provider, MD  allopurinol (ZYLOPRIM) 100 MG tablet Take 100 mg by mouth at bedtime.    Yes Historical Provider, MD  aspirin EC 81 MG tablet Take 81 mg by mouth daily.    Yes Historical Provider, MD  atorvastatin (LIPITOR) 40 MG tablet Take 40 mg by mouth at bedtime.   Yes Historical Provider, MD  benzonatate (TESSALON) 200 MG capsule Take 200 mg by mouth daily as needed for cough. 09/19/13  Yes Marijean Heath, NP  buPROPion (WELLBUTRIN XL) 300 MG 24 hr tablet Take 300 mg by mouth daily.   Yes Historical Provider, MD  Canagliflozin 300 MG TABS Take 1 tablet by mouth daily. Take 300 mg by mouth daily in am. 01/28/14  Yes Philemon Kingdom, MD  carvedilol (COREG) 25 MG tablet Take 1 tablet (25 mg total) by mouth 2 (two) times daily with a meal. 10/01/13  Yes Leonie Man, MD  chlorpheniramine-HYDROcodone (TUSSIONEX) 10-8 MG/5ML LQCR Take 5 mLs by mouth every 12 (twelve) hours as needed for cough.  02/04/14  Yes Laurey Morale, MD  cholecalciferol (VITAMIN D) 1000 UNITS tablet Take 1,000 Units by mouth 2 (two) times daily.   Yes Historical Provider, MD  DEXILANT 60 MG capsule Take one capsule by mouth one time daily 08/27/13  Yes Elsie Stain, MD  diltiazem [facility] CD)  120 MG 24 hr capsule Take 1 capsule (120 mg total) by mouth every evening. 06/04/13  Yes Leonie Man, MD  estradiol (ESTRACE) 2 MG tablet Take 2 mg by mouth at bedtime.    Yes Historical Provider, MD  fenofibrate (TRICOR) 145 MG tablet Take one tablet by mouth one time daily 11/04/13  Yes Leonie Man, MD  furosemide (LASIX) 40 MG tablet Take one and one-half tablet by mouth twice daily   Yes Leonie Man, MD  glipiZIDE (GLIPIZIDE XL) 10 MG 24 hr tablet Take 1 tablet (10 mg total) by mouth daily with breakfast. 12/27/13  Yes Philemon Kingdom, MD  insulin glargine (LANTUS) 100 UNIT/ML injection Inject 23 Units into the skin at bedtime.   Yes Historical Provider, MD  LORazepam (ATIVAN) 0.5 MG tablet Take 1 tablet (0.5 mg total) by mouth 2 (two) times daily as needed for anxiety. 11/29/13  Yes Marletta Lor, MD  losartan (COZAAR) 100 MG tablet Take one tablet by mouth one time daily   Yes Leonie Man, MD  metFORMIN (GLUCOPHAGE) 1000 MG tablet Take 1 tablet (1,000 mg total) by mouth 2 (two) times daily with a meal. 12/27/13  Yes Philemon Kingdom, MD  mometasone-formoterol (DULERA) 200-5 MCG/ACT AERO Inhale 2 puffs into the lungs 2 (two) times daily. 06/15/13  Yes Elsie Stain, MD  nitroGLYCERIN (NITROSTAT) 0.4 MG SL tablet Place 0.4 mg under the tongue every 5 (five) minutes as needed. x3 doses as needed for chest pain.   Yes Historical Provider, MD  nystatin-triamcinolone ointment (MYCOLOG) Apply 1 application topically 2 (two) times daily. 12/26/13  Yes Timoteo Gaul, FNP  omeprazole (PRILOSEC) 20 MG capsule Take 1 capsule (20 mg total) by mouth 2 (two) times daily before a meal. 09/19/13  Yes Marijean Heath, NP  PARoxetine Mesylate (BRISDELLE) 7.5 MG CAPS Take 7.5 mg by mouth daily.   Yes Historical Provider, MD  potassium chloride SA (K-DUR,KLOR-CON) 20 MEQ tablet Take 10 mEq by mouth 3 (three) times daily.   Yes Historical Provider, MD  predniSONE (DELTASONE) 5 MG tablet  Take 2 tablets (10 mg total) by mouth every morning. 03/01/14  Yes Elsie Stain, MD  sucralfate (CARAFATE) 1 GM/10ML suspension Take 10 mLs (1 g total) by mouth 4 (four) times daily -  with meals and at bedtime. 11/29/13  Yes April K Palumbo-Rasch, MD  venlafaxine XR (EFFEXOR-XR) 75 MG 24 hr capsule Take 150 mg by mouth at bedtime.   Yes Historical Provider, MD  azithromycin (ZITHROMAX) 250 MG tablet Take 1 tablet (250 mg total) by mouth daily. Take two once then one daily until gone 03/01/14   Elsie Stain, MD  B-D ULTRAFINE III SHORT PEN 31G X 8 MM MISC Follow directions provided by physician 09/13/13   Lisabeth Pick, MD  Blood Glucose Monitoring Suppl (FREESTYLE FREEDOM LITE) W/DEVICE KIT Use as adviced 12/27/13   Philemon Kingdom, MD  glucose blood (ONE TOUCH ULTRA TEST) test strip 1 each by Other route 3 (three) times daily. Use as instructed 10/19/13   Lisabeth Pick, MD   BP 157/64  Pulse 97  Temp(Src) 98.2 F (36.8 C) (Oral)  Resp 24  Ht _0  (1.676 m)  Wt 260 lb (117.935 kg)  BMI 41.99 kg/m2  SpO2 100% Physical Exam  Nursing note and vitals reviewed. Constitutional: She is oriented to person, place, and time. She appears well-developed and well-nourished.  HENT:  Head: Normocephalic and atraumatic.  Mild dry mucous membranes  Eyes: Conjunctivae are normal. Right eye exhibits no discharge. Left eye exhibits no discharge.  Neck: Normal range of motion. Neck supple. No tracheal deviation present.  Cardiovascular: Regular rhythm.  Tachycardia present.   Pulmonary/Chest: She is in respiratory distress (suprasternal retractions). She has wheezes.  Abdominal: Soft. She exhibits no distension. There is no tenderness. There is no guarding.  Musculoskeletal: She exhibits no edema.  Neurological: She is alert and oriented to person, place, and time.  Skin: Skin is warm. No rash noted.  Psychiatric: She has a normal mood and affect.    ED Course  Procedures (including critical care  time) CRITICAL CARE Performed by: Mariea Clonts   Total critical care time: 30 min  Critical care time was exclusive of separately  billable procedures and treating other patients.  Critical care was necessary to treat or prevent imminent or life-threatening deterioration.  Critical care was time spent personally by me on the following activities: development of treatment plan with patient and/or surrogate as well as nursing, discussions with consultants, evaluation of patient's response to treatment, examination of patient, obtaining history from patient or surrogate, ordering and performing treatments and interventions, ordering and review of laboratory studies, ordering and review of radiographic studies, pulse oximetry and re-evaluation of patient's condition.  Labs Review Labs Reviewed  CBC WITH DIFFERENTIAL - Abnormal; Notable for the following:    WBC 15.0 (*)    Neutro Abs 11.0 (*)    All other components within normal limits  BASIC METABOLIC PANEL - Abnormal; Notable for the following:    Sodium 136 (*)    Potassium 3.6 (*)    Glucose, Bld 319 (*)    All other components within normal limits  COMPREHENSIVE METABOLIC PANEL - Abnormal; Notable for the following:    Potassium 3.5 (*)    Glucose, Bld 417 (*)    All other components within normal limits  CBC - Abnormal; Notable for the following:    WBC 15.0 (*)    Hemoglobin 11.9 (*)    All other components within normal limits  GLUCOSE, CAPILLARY - Abnormal; Notable for the following:    Glucose-Capillary 406 (*)    All other components within normal limits  GLUCOSE, CAPILLARY - Abnormal; Notable for the following:    Glucose-Capillary 316 (*)    All other components within normal limits  GLUCOSE, CAPILLARY - Abnormal; Notable for the following:    Glucose-Capillary 328 (*)    All other components within normal limits  GLUCOSE, CAPILLARY - Abnormal; Notable for the following:    Glucose-Capillary 303 (*)    All other  components within normal limits  CULTURE, EXPECTORATED SPUTUM-ASSESSMENT  CULTURE, RESPIRATORY (NON-EXPECTORATED)  PRO B NATRIURETIC PEPTIDE  TROPONIN I  MAGNESIUM  PROTIME-INR  CBC  BASIC METABOLIC PANEL    Imaging Review Dg Chest 2 View  03/13/2014   CLINICAL DATA:  Shortness of breath  EXAM: CHEST  2 VIEW  COMPARISON:  12/02/2013  FINDINGS: Stable mild cardiomegaly. No change in upper mediastinal contours. There is chronic pulmonary hypoaeration. Increased density at the medial right base correlates with mediastinum fat on preceding CT. No consolidation, edema, effusion, or pneumothorax.  IMPRESSION: No active cardiopulmonary disease.   Electronically Signed   By: Jorje Guild M.D.   On: 03/13/2014 22:22     EKG Interpretation None     EKG reviewed sinus rhythm, heart rate 98, normal axis, nonspecific T wave findings, poor R-wave progression, normal QT, no acute ST elevation. MDM   Final diagnoses:  COPD with acute exacerbation  Acute dyspnea  Sarcoidosis   Patient presented with respiratory difficulty, retractions, tachypnea with clinically COPD exacerbation. Duo neb and continuous neb ordered with magnesium. Chest x-ray no infiltrate. After continuous neb recheck patient mild improved however still working hard to breathe. Repeat continuous neb ordered. Patient observed in the ER. Second continuous neb in the ER patient improved and plan for admission. Discussed with triad for telemetry admission this patient has improved. Patient receive steroids in the ER IV.  The patients results and plan were reviewed and discussed.   Any x-rays performed were personally reviewed by myself.   Differential diagnosis were considered with the presenting HPI.   Filed Vitals:   03/13/14 2145 03/13/14 2229 03/13/14 2300 03/13/14  2315  BP: 132/69  146/56 157/64  Pulse: 75 87 91 97  Temp:      TempSrc:      Resp: 33 _0 Height:      Weight:      SpO2: 99% 100% 100% 100%     Admission/ observation were discussed with the admitting physician, patient and/or family and they are comfortable with the plan.     Mariea Clonts, MD 03/15/14 [phone]

## 2014-03-14 NOTE — ED Notes (Signed)
Pt assisted to bedside commode. Pt back in bed.

## 2014-03-14 NOTE — Progress Notes (Signed)
[facility] PROGRESS NOTE  [name] UMP:[id number] DOB: 1961/02/20 DOA: 03/13/2014 PCP: Chancy Hurter, MD  Assessment/Plan: 1. Chronic obstructive pulmonary disease exacerbation -Patient presenting with complaints of increasing cough, shortness of breath, wheezing -Continue prednisone 60 mg by mouth daily, nebs, empiric antibiotic therapy -Although she denies smoking history, endorses secondhand smoke from her husband.  2.  Possible community acquire pneumonia -It is possible underlying bacterial or viral infection may have precipitated COPD exacerbation -She is being covered with Levaquin 750 mg IV every 24 hours -Remained hemodynamically stable  3.  Insulin-dependent diabetes mellitus -Patient started on steroids now having elevated blood sugars -Increased her Lantus from 23 units to 30 units subcutaneous each bedtime -Continue Accu-Cheks q. a.c. and each bedtime with sliding scale coverage  4.  Hypertension -Continue carvedilol 25 mg by mouth twice a day, Cardizem 20 mg by mouth daily -Patient systolic blood pressures in the 140s  Code Status: Full Code Family Communication:  Disposition Plan: Continue supportive care, steroids, nebs, AB's   Antibiotics:  Levaquin 750 mg IV every 24 hours  HPI/Subjective: Patient is a pleasant 53 year old female with a past medical history of COPD, sarcoidosis, admitted to the medicine service on 03/14/2014 presenting with complaints of cough, shortness of breath, wheezing. Initial chest x-ray did not reveal acute cardiopulmonary disease. She was treated for a COPD exacerbation, started on steroids, nebs, empiric IV antibiotic therapy this was reported feeling better although still not at her baseline.  Objective: Filed Vitals:   03/14/14 1352  BP: 144/74  Pulse: 85  Temp: 97.9 F (36.6 C)  Resp: 20    Intake/Output Summary (Last 24 hours) at 03/14/14 1418 Last data filed at 03/14/14 1300  Gross per 24 hour   Intake    720 ml  Output   1400 ml  Net   -680 ml   Filed Weights   03/13/14 2032 03/14/14 0251  Weight: 117.935 kg (260 lb) 118.1 kg (260 lb 5.8 oz)    Exam:   General:  Patient appears to have some dyspnea at rest, no acute distress however  Cardiovascular: Regular rate rhythm normal S1-S2  Respiratory: As by lateral expiratory wheezes, positive rhonchi, mildly dyspneic  Abdomen: Obese, soft, nontender nondistended  Musculoskeletal: No edema  Data Reviewed: Basic Metabolic Panel:  Recent Labs Lab 03/13/14 2129 03/14/14 0449  NA 136* 142  K 3.6* 3.5*  CL 96 97  CO2 22 23  GLUCOSE 319* 417*  BUN 20 19  CREATININE 0.71 0.64  CALCIUM 9.8 9.2  MG 1.7  --    Liver Function Tests:  Recent Labs Lab 03/14/14 0449  AST 13  ALT 16  ALKPHOS 70  BILITOT 0.3  PROT 7.0  ALBUMIN 3.7   No results found for this basename: LIPASE, AMYLASE,  in the last 168 hours No results found for this basename: AMMONIA,  in the last 168 hours CBC:  Recent Labs Lab 03/13/14 2129 03/14/14 0449  WBC 15.0* 15.0*  NEUTROABS 11.0*  --   HGB 12.2 11.9*  HCT 36.4 36.3  MCV 87.9 89.0  PLT 382 352   Cardiac Enzymes:  Recent Labs Lab 03/13/14 2129  TROPONINI <0.30   BNP (last 3 results)  Recent Labs  09/18/13 0019 12/02/13 2017 03/13/14 2129  PROBNP 93.8 63.5 95.1   CBG:  Recent Labs Lab 03/14/14 0609 03/14/14 1142  GLUCAP 406* 316*    Recent Results (from the past 240 hour(s))  CULTURE, EXPECTORATED SPUTUM-ASSESSMENT     Status: None  Collection Time    03/14/14  6:00 AM      Result Value Ref Range Status   Specimen Description SPUTUM   Final   Special Requests NONE   Final   Sputum evaluation     Final   Value: THIS SPECIMEN IS ACCEPTABLE. RESPIRATORY CULTURE REPORT TO FOLLOW.   Report Status 03/14/2014 FINAL   Final  CULTURE, RESPIRATORY (NON-EXPECTORATED)     Status: None   Collection Time    03/14/14  6:00 AM      Result Value Ref Range Status    Specimen Description SPUTUM   Final   Special Requests NONE   Final   Gram Stain     Final   Value: MODERATE WBC PRESENT,BOTH PMN AND MONONUCLEAR     RARE SQUAMOUS EPITHELIAL CELLS PRESENT     RARE GRAM POSITIVE COCCI IN PAIRS     Performed at [facility]   Culture PENDING   Incomplete   Report Status PENDING   Incomplete     Studies: Dg Chest 2 View  03/13/2014   CLINICAL DATA:  Shortness of breath  EXAM: CHEST  2 VIEW  COMPARISON:  12/02/2013  FINDINGS: Stable mild cardiomegaly. No change in upper mediastinal contours. There is chronic pulmonary hypoaeration. Increased density at the medial right base correlates with mediastinum fat on preceding CT. No consolidation, edema, effusion, or pneumothorax.  IMPRESSION: No active cardiopulmonary disease.   Electronically Signed   By: Jorje Guild M.D.   On: 03/13/2014 22:22    Scheduled Meds: . allopurinol  100 mg Oral QHS  . aspirin EC  81 mg Oral Daily  . atorvastatin  40 mg Oral QHS  . buPROPion  300 mg Oral Daily  . carvedilol  25 mg Oral BID WC  . diltiazem  120 mg Oral QPM  . enoxaparin (LOVENOX) injection  40 mg Subcutaneous Q24H  . estradiol  2 mg Oral QHS  . furosemide  40 mg Oral BID  . guaiFENesin  1,200 mg Oral BID  . insulin aspart  0-15 Units Subcutaneous TID WC  . insulin aspart  0-5 Units Subcutaneous QHS  . insulin glargine  30 Units Subcutaneous QHS  . ipratropium-albuterol  3 mL Nebulization Q4H  . levofloxacin (LEVAQUIN) IV  750 mg Intravenous Q24H  . losartan  100 mg Oral Daily  . mometasone-formoterol  2 puff Inhalation BID  . pantoprazole  40 mg Oral Daily  . PARoxetine  10 mg Oral Daily  . potassium chloride SA  10 mEq Oral TID  . predniSONE  60 mg Oral Q breakfast  . sodium chloride  3 mL Intravenous Q12H  . sucralfate  1 g Oral TID WC & HS  . venlafaxine XR  150 mg Oral QHS   Continuous Infusions:   Principal Problem:   COPD with acute exacerbation Active Problems:   Sarcoidosis of lung    HYPERTENSION   GERD   SLEEP APNEA   Type II diabetes mellitus with neurological manifestations, uncontrolled   Chronic cough   Chronic diastolic heart failure, NYHA class 2   Vocal cord dysfunction    Time spent: 35 min    [facility] Pager [phone]. If 7PM-7AM, please contact night-coverage at [email or web address], password [facility] 03/14/2014, 2:18 PM  LOS: 1 day

## 2014-03-14 NOTE — Progress Notes (Signed)
Admitted pt to rm 3E17 from ED, alert and oriented, denied pain at this time. Admission assessment done, call bell placed within reach, husband at bedside. MD notified for orders, will continue to monitor.

## 2014-03-14 NOTE — H&P (Signed)
[facility] History and Physical  Patient: [name]  QBH:[id number]  DOB: 09-29-1961  DOS: the patient was seen and examined on 03/14/2014 PCP: Chancy Hurter, MD  Chief Complaint: Shortness of breath  HPI: [name] is a 53 y.o. female with Past medical history of diabetes mellitus, COPD, sarcoidosis, hypertension, sleep apnea, diastolic dysfunction. Patient presented with complaints of shortness of breath. Along with cough. She mentions that 2 weeks ago she was treated with one dose of IM Solu-Medrol and antibiotic for bronchitis leading to COPD exacerbation. With that her symptoms improved significantly. Since last 34 days she started having complaints of dry cough and progressively worsening shortness of breath. On the day of her arrival to ED her shortness of breath was significantly worse even at rest and it was not improving with her nebulizers. She was given continuous albuterol nebs and magnesium sulfate despite which she did not felt better and therefore she was recommended for admission.  The patient is coming from home. And at her baseline independent for most of her ADL.  Review of Systems: as mentioned in the history of present illness.  A Comprehensive review of the other systems is negative.  Past Medical History  Diagnosis Date  . DIABETES MELLITUS, TYPE II 08/21/2006  . HYPERLIPIDEMIA 08/21/2006  . GOUT 08/20/2010  . OBESITY 06/04/2009  . ANEMIA-UNSPECIFIED 09/18/2009  . HYPERTENSION 08/21/2006  . GERD 08/21/2006  . SLEEP APNEA 04/21/2009    last testing was negative  . Internal hemorrhoids   . Pulmonary sarcoidosis     Followed locally by pulmonology, but also by Dr. Casper Harrison at [facility]  . Exertional chest pain     sharp, substernal, exertional  . Vocal cord dysfunction   . Chronic diastolic heart failure, NYHA class 2     LVEDP roughly 20% by cath  . CHF (congestive heart failure)    Past Surgical History  Procedure  Laterality Date  . Ventral hernia repair    . Nissen fundoplication  0973  . Cholecystectomy  1984  . Abdominal hysterectomy    . Knee arthroscopy      right  . Tubal ligation      with reversal in 1994  . Bladder suspension  11/11/2011    Procedure: TRANSVAGINAL TAPE (TVT) PROCEDURE;  Surgeon: Olga Millers, MD;  Location: [facility];  Service: Gynecology;  Laterality: N/A;  . Cystoscopy  11/11/2011    Procedure: CYSTOSCOPY;  Surgeon: Olga Millers, MD;  Location: [facility];  Service: Gynecology;  Laterality: N/A;  . Doppler echocardiography  02/12/2013    LV FUNCTION, SIZE NORMAL; MILD CONCENTRIC LVH; EST EF 55-65%; WALL MOTION NORMAL  . Lexiscan myoview  03/09/2013    EF 50%; NORMAL MYOCARDIAL PERFUSION STUDY - breast attenuation  . Cardiac catheterization  07/2010    LVEF 50-55% WITH VERY MILD GLOBAL HYPOKINESIA; ESSENTIALLY NORMAL CORONARY ARTERIES; NORMAL LV FUNCTION  . Carotids  02/18/11    CAROTID DUPLEX; VERTEBRALS ARE PATENT WITH ANTEGRADE FLOW. ICA/CCA RATIO 1.61 ON RIGHT AND 0.75 ON LEFT  . Right and left cardiac catheterization  04/23/2013    Angiographic normal coronaries; LVEDP 20 mmHg, PCWP 12-14 mmHg, RAP 12 mmHg.; Fick CO/CI 4.9/2.2   Social History:  reports that she has never smoked. She has never used smokeless tobacco. She reports that she does not drink alcohol or use illicit drugs.  Allergies  Allergen Reactions  . Lisinopril Cough  . Methotrexate     REACTION: peri-oral and buccal  lesions.  . Clindamycin/Lincomycin Nausea And Vomiting and Rash    Family History  Problem Relation Age of Onset  . Diabetes Father   . Heart attack Father   . Coronary artery disease Father   . Heart failure Father   . COPD Mother   . Emphysema Mother   . Asthma Mother   . Heart failure Mother   . Sarcoidosis Maternal Uncle   . Colon cancer Neg Hx   . Lung cancer Brother   . Cancer Brother   . Diabetes Brother   . Heart attack Maternal Grandfather     Prior to Admission  medications   Medication Sig Start Date End Date Taking? Authorizing Provider  albuterol (PROVENTIL HFA;VENTOLIN HFA) 108 (90 BASE) MCG/ACT inhaler Inhale 2 puffs into the lungs every 6 (six) hours as needed. For wheezing.   Yes Historical Provider, MD  albuterol (PROVENTIL) (5 MG/ML) 0.5% nebulizer solution Take 2.5 mg by nebulization every 6 (six) hours as needed. For wheezing.   Yes Historical Provider, MD  allopurinol (ZYLOPRIM) 100 MG tablet Take 100 mg by mouth at bedtime.    Yes Historical Provider, MD  aspirin EC 81 MG tablet Take 81 mg by mouth daily.    Yes Historical Provider, MD  atorvastatin (LIPITOR) 40 MG tablet Take 40 mg by mouth at bedtime.   Yes Historical Provider, MD  azithromycin (ZITHROMAX) 250 MG tablet Take 250 mg by mouth every Monday, Wednesday, and Friday.   Yes Historical Provider, MD  buPROPion (WELLBUTRIN XL) 300 MG 24 hr tablet Take 300 mg by mouth daily.   Yes Historical Provider, MD  Canagliflozin 300 MG TABS Take 1 tablet by mouth daily. Take 300 mg by mouth daily in am. 01/28/14  Yes Cristina Gherghe, MD  carvedilol (COREG) 25 MG tablet Take 1 tablet (25 mg total) by mouth 2 (two) times daily with a meal. 10/01/13  Yes David W Harding, MD  chlorpheniramine-HYDROcodone (TUSSIONEX) 10-8 MG/5ML LQCR Take 5 mLs by mouth every 12 (twelve) hours as needed for cough.  02/04/14  Yes Stephen A Fry, MD  cholecalciferol (VITAMIN D) 1000 UNITS tablet Take 1,000 Units by mouth 2 (two) times daily.   Yes Historical Provider, MD  DEXILANT 60 MG capsule Take one capsule by mouth one time daily 08/27/13  Yes Patrick E [name], MD  diltiazem (CARDIZEM CD) 120 MG 24 hr capsule Take 1 capsule (120 mg total) by mouth every evening. 06/04/13  Yes David W Harding, MD  estradiol (ESTRACE) 2 MG tablet Take 2 mg by mouth at bedtime.    Yes Historical Provider, MD  fenofibrate (TRICOR) 145 MG tablet Take one tablet by mouth one time daily 11/04/13  Yes David W Harding, MD  furosemide (LASIX) 40  MG tablet Take one tablet by mouth twice daily   Yes David W Harding, MD  glipiZIDE (GLIPIZIDE XL) 10 MG 24 hr tablet Take 1 tablet (10 mg total) by mouth daily with breakfast. 12/27/13  Yes Cristina Gherghe, MD  insulin glargine (LANTUS) 100 UNIT/ML injection Inject 23 Units into the skin at bedtime.   Yes Historical Provider, MD  LORazepam (ATIVAN) 0.5 MG tablet Take 1 tablet (0.5 mg total) by mouth 2 (two) times daily as needed for anxiety. 11/29/13  Yes Peter F Kwiatkowski, MD  losartan (COZAAR) 100 MG tablet Take one tablet by mouth one time daily   Yes David W Harding, MD  metFORMIN (GLUCOPHAGE) 1000 MG tablet Take 1 tablet (1,000 mg total) by mouth 2 (  two) times daily with a meal. 12/27/13  Yes Cristina Gherghe, MD  mometasone-formoterol (DULERA) 200-5 MCG/ACT AERO Inhale 2 puffs into the lungs 2 (two) times daily. 06/15/13  Yes Patrick E [name], MD  nitroGLYCERIN (NITROSTAT) 0.4 MG SL tablet Place 0.4 mg under the tongue every 5 (five) minutes as needed. x3 doses as needed for chest pain.   Yes Historical Provider, MD  nystatin-triamcinolone ointment (MYCOLOG) Apply 1 application topically 2 (two) times daily. 12/26/13  Yes Padonda B Campbell, FNP  omeprazole (PRILOSEC) 20 MG capsule Take 1 capsule (20 mg total) by mouth 2 (two) times daily before a meal. 09/19/13  Yes Kathryn A Whiteheart, NP  PARoxetine Mesylate (BRISDELLE) 7.5 MG CAPS Take 7.5 mg by mouth daily.   Yes Historical Provider, MD  potassium chloride SA (K-DUR,KLOR-CON) 20 MEQ tablet Take 10 mEq by mouth 3 (three) times daily.   Yes Historical Provider, MD  predniSONE (DELTASONE) 5 MG tablet Take 2 tablets (10 mg total) by mouth every morning. 03/01/14  Yes Patrick E [name], MD  sucralfate (CARAFATE) 1 GM/10ML suspension Take 10 mLs (1 g total) by mouth 4 (four) times daily -  with meals and at bedtime. 11/29/13  Yes April K Palumbo-Rasch, MD  venlafaxine XR (EFFEXOR-XR) 75 MG 24 hr capsule Take 150 mg by mouth at bedtime.   Yes  Historical Provider, MD  B-D ULTRAFINE III SHORT PEN 31G X 8 MM MISC Follow directions provided by physician 09/13/13   Bruce H Swords, MD  Blood Glucose Monitoring Suppl (FREESTYLE FREEDOM LITE) W/DEVICE KIT Use as adviced 12/27/13   Cristina Gherghe, MD  glucose blood (ONE TOUCH ULTRA TEST) test strip 1 each by Other route 3 (three) times daily. Use as instructed 10/19/13   Bruce H Swords, MD    Physical Exam: Filed Vitals:   03/13/14 2315 03/14/14 0115 03/14/14 0120 03/14/14 0251  BP: 157/64 152/65  143/66  Pulse: 97 99 101   Temp:    97.8 F (36.6 C)  TempSrc:    Oral  Resp: 24 19 24 22  Height:    5' 6" (1.676 m)  Weight:    118.1 kg (260 lb 5.8 oz)  SpO2: 100% 98% 95% 96%    General: Alert, Awake and Oriented to Time, Place and Person. Appear in mild distress Eyes: PERRL ENT: Oral Mucosa clear moist. Neck: No JVD Cardiovascular: S1 and S2 Present, no Murmur, Peripheral Pulses Present Respiratory: Bilateral Air entry equal and Decreased, no Crackles, minimal expiratory wheezes Abdomen: Bowel Sound Present, Soft and non tender Skin:  no Rash Extremities:  bilateral Pedal edema,  no calf tenderness Neurologic: Grossly no focal neuro deficit. Labs on Admission:  CBC:  Recent Labs Lab 03/13/14 2129  WBC 15.0*  NEUTROABS 11.0*  HGB 12.2  HCT 36.4  MCV 87.9  PLT 382    CMP     Component Value Date/Time   NA 136* 03/13/2014 2129   K 3.6* 03/13/2014 2129   CL 96 03/13/2014 2129   CO2 22 03/13/2014 2129   GLUCOSE 319* 03/13/2014 2129   GLUCOSE 150* 09/14/2006 1033   BUN 20 03/13/2014 2129   CREATININE 0.71 03/13/2014 2129   CREATININE 0.60 04/19/2013 0901   CALCIUM 9.8 03/13/2014 2129   PROT 6.5 12/10/2013 0853   ALBUMIN 3.5 12/10/2013 0853   AST 10 12/10/2013 0853   ALT 13 12/10/2013 0853   ALKPHOS 46 12/10/2013 0853   BILITOT 0.4 12/10/2013 0853   GFRNONAA >90 03/13/2014 2129     GFRAA >90 03/13/2014 2129    No results found for this basename: LIPASE, AMYLASE,  in the last 168 hours No  results found for this basename: AMMONIA,  in the last 168 hours   Recent Labs Lab 03/13/14 2129  TROPONINI <0.30   BNP (last 3 results)  Recent Labs  09/18/13 0019 12/02/13 2017 03/13/14 2129  PROBNP 93.8 63.5 95.1    Radiological Exams on Admission: Dg Chest 2 View  03/13/2014   CLINICAL DATA:  Shortness of breath  EXAM: CHEST  2 VIEW  COMPARISON:  12/02/2013  FINDINGS: Stable mild cardiomegaly. No change in upper mediastinal contours. There is chronic pulmonary hypoaeration. Increased density at the medial right base correlates with mediastinum fat on preceding CT. No consolidation, edema, effusion, or pneumothorax.  IMPRESSION: No active cardiopulmonary disease.   Electronically Signed   By: Jonathan  Watts M.D.   On: 03/13/2014 22:22     Assessment/Plan Principal Problem:   COPD with acute exacerbation Active Problems:   Sarcoidosis of lung   HYPERTENSION   GERD   SLEEP APNEA   Type II diabetes mellitus with neurological manifestations, uncontrolled   Chronic cough   Chronic diastolic heart failure, NYHA class 2   Vocal cord dysfunction   1. COPD with acute exacerbation  patient is presenting with complaints of expiratory wheezes at this of breath and initial hypoxia which has improved with the treatment. At present she would be admitted to the hospital. We'll treat her with high-dose prednisone, oxygen as needed, duo nebs, levofloxacin Sputum cultures, urine antigens as we followed  2.GERD Continue Protonix  3. Diabetes Continue home insulin and placed on sliding scale  4. Mood disorder Continue home medications  5. Diastolic dysfunction Continue Lasix  DVT Prophylaxis: subcutaneous Heparin Nutrition:  cardiac and diabetic diet  Code Status:  full  Family Communication:  husband was present at bedside, opportunity was given to ask question and all questions were answered satisfactorily at the time of interview. Disposition: Admitted to observation in  telemetry unit.  Author:  , MD [facility] Pager: [phone] 03/14/2014, 3:31 AM    If 7PM-7AM, please contact night-coverage [email or web address] Password [email or web address]  

## 2014-03-14 NOTE — ED Notes (Signed)
Pt unable to go to floor at this time due to hour long nebulizer breathing treatment. This RN spoke with Dr. Posey Pronto and was informed that she would not be getting any more hour long treatments and that she was appropriate for telemetry floor.

## 2014-03-14 NOTE — Progress Notes (Signed)
Pt's BG=406, Dr. Posey Pronto notified, novolog 15units given.

## 2014-03-15 ENCOUNTER — Ambulatory Visit: Payer: BC Managed Care – PPO | Admitting: Internal Medicine

## 2014-03-15 DIAGNOSIS — E1149 Type 2 diabetes mellitus with other diabetic neurological complication: Secondary | ICD-10-CM | POA: Diagnosis not present

## 2014-03-15 DIAGNOSIS — J441 Chronic obstructive pulmonary disease with (acute) exacerbation: Secondary | ICD-10-CM | POA: Diagnosis not present

## 2014-03-15 DIAGNOSIS — J189 Pneumonia, unspecified organism: Secondary | ICD-10-CM | POA: Diagnosis not present

## 2014-03-15 LAB — CBC
HCT: 36.5 % (ref 36.0–46.0)
Hemoglobin: 11.6 g/dL — ABNORMAL LOW (ref 12.0–15.0)
MCH: 29.1 pg (ref 26.0–34.0)
MCHC: 31.8 g/dL (ref 30.0–36.0)
MCV: 91.7 fL (ref 78.0–100.0)
Platelets: 354 10*3/uL (ref 150–400)
RBC: 3.98 MIL/uL (ref 3.87–5.11)
RDW: 14.2 % (ref 11.5–15.5)
WBC: 15.2 10*3/uL — ABNORMAL HIGH (ref 4.0–10.5)

## 2014-03-15 LAB — BASIC METABOLIC PANEL
BUN: 22 mg/dL (ref 6–23)
CO2: 29 mEq/L (ref 19–32)
Calcium: 8.9 mg/dL (ref 8.4–10.5)
Chloride: 100 mEq/L (ref 96–112)
Creatinine, Ser: 0.65 mg/dL (ref 0.50–1.10)
GFR calc Af Amer: >90 mL/min (ref >90)
GFR calc non Af Amer: >90 mL/min (ref >90)
Glucose, Bld: 223 mg/dL — ABNORMAL HIGH (ref 70–99)
Potassium: 4.2 mEq/L (ref 3.7–5.3)
Sodium: 142 mEq/L (ref 137–147)

## 2014-03-15 LAB — GLUCOSE, CAPILLARY
Glucose-Capillary: 249 mg/dL — ABNORMAL HIGH (ref 70–99)
Glucose-Capillary: 300 mg/dL — ABNORMAL HIGH (ref 70–99)
Glucose-Capillary: 323 mg/dL — ABNORMAL HIGH (ref 70–99)
Glucose-Capillary: 277 mg/dL — ABNORMAL HIGH (ref 70–99)

## 2014-03-15 MED ORDER — INSULIN ASPART 100 UNIT/ML ~~LOC~~ SOLN
0.0000 [IU] | Freq: Every day | SUBCUTANEOUS | Status: DC
  Administered 2014-03-15: 3 [IU] via SUBCUTANEOUS

## 2014-03-15 MED ORDER — METFORMIN HCL 500 MG PO TABS
1000.0000 mg | ORAL_TABLET | Freq: Two times a day (BID) | ORAL | Status: DC
  Administered 2014-03-15 – 2014-03-16 (×2): 1000 mg via ORAL
  Filled 2014-03-15 (×4): qty 2

## 2014-03-15 MED ORDER — INSULIN ASPART 100 UNIT/ML ~~LOC~~ SOLN
0.0000 [IU] | Freq: Three times a day (TID) | SUBCUTANEOUS | Status: DC
  Administered 2014-03-15 – 2014-03-16 (×2): 15 [IU] via SUBCUTANEOUS
  Administered 2014-03-16: 7 [IU] via SUBCUTANEOUS

## 2014-03-15 NOTE — Progress Notes (Signed)
[facility] PROGRESS NOTE  JYA HUGHSTON WFU:[id number] DOB: 01/30/1961 DOA: 03/13/2014 PCP: Chancy Hurter, MD  Assessment/Plan: 1. Chronic obstructive pulmonary disease exacerbation -Patient presenting with complaints of increasing cough, shortness of breath, wheezing -Continue prednisone 60 mg by mouth daily, nebs, empiric antibiotic therapy -Although she denies smoking history, endorses secondhand smoke from her husband.  2.  Possible community acquire pneumonia -It is possible underlying bacterial or viral infection may have precipitated COPD exacerbation -She is being covered with Levaquin 750 mg IV every 24 hours -Remained hemodynamically stable  3.  Insulin-dependent diabetes mellitus -Patient started on steroids now having elevated blood sugars in the 300's -Increased her Lantus from 23 units to 30 units subcutaneous each bedtime -Continue Accu-Cheks q. a.c. and each bedtime with sliding scale coverage -Will restart her Metformin 1000 mg PO BID  4.  Hypertension -Continue carvedilol 25 mg by mouth twice a day, Cardizem 20 mg by mouth daily -Patient systolic blood pressures in the 140s  Code Status: Full Code Family Communication:  Disposition Plan: Continue supportive care, steroids, nebs, AB's   Antibiotics:  Levaquin 750 mg IV every 24 hours  HPI/Subjective: Patient is a pleasant 53 year old female with a past medical history of COPD, sarcoidosis, admitted to the medicine service on 03/14/2014 presenting with complaints of cough, shortness of breath, wheezing. Initial chest x-ray did not reveal acute cardiopulmonary disease. She was treated for a COPD exacerbation, started on steroids, nebs, empiric IV antibiotic therapy this was reported feeling better although still not at her baseline.   She states feeling better today but not quit at her baseline  Objective: Filed Vitals:   03/15/14 1427  BP: 142/61  Pulse: 82  Temp: 98.6 F (37 C)  Resp: 18     Intake/Output Summary (Last 24 hours) at 03/15/14 1606 Last data filed at 03/15/14 1300  Gross per 24 hour  Intake   1280 ml  Output   3100 ml  Net  -1820 ml   Filed Weights   03/13/14 2032 03/14/14 0251 03/15/14 0525  Weight: 117.935 kg (260 lb) 118.1 kg (260 lb 5.8 oz) 117.572 kg (259 lb 3.2 oz)    Exam:   General:  Patient appears to have some dyspnea at rest, no acute distress however  Cardiovascular: Regular rate rhythm normal S1-S2  Respiratory: Improved lung exam, with a decrease in her wheezes  Abdomen: Obese, soft, nontender nondistended  Musculoskeletal: No edema  Data Reviewed: Basic Metabolic Panel:  Recent Labs Lab 03/13/14 2129 03/14/14 0449 03/15/14 0640  NA 136* 142 142  K 3.6* 3.5* 4.2  CL 96 97 100  CO2 22 23 29   GLUCOSE 319* 417* 223*  BUN 20 19 22   CREATININE 0.71 0.64 0.65  CALCIUM 9.8 9.2 8.9  MG 1.7  --   --    Liver Function Tests:  Recent Labs Lab 03/14/14 0449  AST 13  ALT 16  ALKPHOS 70  BILITOT 0.3  PROT 7.0  ALBUMIN 3.7   No results found for this basename: LIPASE, AMYLASE,  in the last 168 hours No results found for this basename: AMMONIA,  in the last 168 hours CBC:  Recent Labs Lab 03/13/14 2129 03/14/14 0449 03/15/14 0640  WBC 15.0* 15.0* 15.2*  NEUTROABS 11.0*  --   --   HGB 12.2 11.9* 11.6*  HCT 36.4 36.3 36.5  MCV 87.9 89.0 91.7  PLT 382 352 354   Cardiac Enzymes:  Recent Labs Lab 03/13/14 2129  TROPONINI <0.30   BNP (  last 3 results)  Recent Labs  09/18/13 0019 12/02/13 2017 03/13/14 2129  PROBNP 93.8 63.5 95.1   CBG:  Recent Labs Lab 03/14/14 1142 03/14/14 1636 03/14/14 2044 03/15/14 0604 03/15/14 1158  GLUCAP 316* 328* 303* 249* 300*    Recent Results (from the past 240 hour(s))  CULTURE, EXPECTORATED SPUTUM-ASSESSMENT     Status: None   Collection Time    03/14/14  6:00 AM      Result Value Ref Range Status   Specimen Description SPUTUM   Final   Special Requests NONE    Final   Sputum evaluation     Final   Value: THIS SPECIMEN IS ACCEPTABLE. RESPIRATORY CULTURE REPORT TO FOLLOW.   Report Status 03/14/2014 FINAL   Final  CULTURE, RESPIRATORY (NON-EXPECTORATED)     Status: None   Collection Time    03/14/14  6:00 AM      Result Value Ref Range Status   Specimen Description SPUTUM   Final   Special Requests NONE   Final   Gram Stain     Final   Value: MODERATE WBC PRESENT,BOTH PMN AND MONONUCLEAR     RARE SQUAMOUS EPITHELIAL CELLS PRESENT     RARE GRAM POSITIVE COCCI IN PAIRS     Performed at [facility]   Culture     Final   Value: NORMAL OROPHARYNGEAL FLORA     Performed at [facility]   Report Status PENDING   Incomplete     Studies: Dg Chest 2 View  03/13/2014   CLINICAL DATA:  Shortness of breath  EXAM: CHEST  2 VIEW  COMPARISON:  12/02/2013  FINDINGS: Stable mild cardiomegaly. No change in upper mediastinal contours. There is chronic pulmonary hypoaeration. Increased density at the medial right base correlates with mediastinum fat on preceding CT. No consolidation, edema, effusion, or pneumothorax.  IMPRESSION: No active cardiopulmonary disease.   Electronically Signed   By: Jorje Guild M.D.   On: 03/13/2014 22:22    Scheduled Meds: . allopurinol  100 mg Oral QHS  . aspirin EC  81 mg Oral Daily  . atorvastatin  40 mg Oral QHS  . buPROPion  300 mg Oral Daily  . carvedilol  25 mg Oral BID WC  . diltiazem  120 mg Oral QPM  . enoxaparin (LOVENOX) injection  40 mg Subcutaneous Q24H  . estradiol  2 mg Oral QHS  . furosemide  40 mg Oral BID  . guaiFENesin  1,200 mg Oral BID  . insulin aspart  0-20 Units Subcutaneous TID WC  . insulin aspart  0-5 Units Subcutaneous QHS  . insulin glargine  30 Units Subcutaneous QHS  . ipratropium-albuterol  3 mL Nebulization TID  . levofloxacin (LEVAQUIN) IV  750 mg Intravenous Q24H  . losartan  100 mg Oral Daily  . metFORMIN  1,000 mg Oral BID WC  . mometasone-formoterol  2 puff  Inhalation BID  . pantoprazole  40 mg Oral Daily  . PARoxetine  10 mg Oral Daily  . potassium chloride SA  10 mEq Oral TID  . predniSONE  60 mg Oral Q breakfast  . sodium chloride  3 mL Intravenous Q12H  . sucralfate  1 g Oral TID WC & HS  . venlafaxine XR  150 mg Oral QHS   Continuous Infusions:   Principal Problem:   COPD with acute exacerbation Active Problems:   Sarcoidosis of lung   HYPERTENSION   GERD   SLEEP APNEA   Type II  diabetes mellitus with neurological manifestations, uncontrolled   Chronic cough   Chronic diastolic heart failure, NYHA class 2   Vocal cord dysfunction    Time spent: 35 min    [facility] Pager [phone]. If 7PM-7AM, please contact night-coverage at [email or web address], password [facility] 03/15/2014, 4:06 PM  LOS: 2 days

## 2014-03-15 NOTE — Progress Notes (Signed)
Inpatient Diabetes Program Recommendations  AACE/ADA: New Consensus Statement on Inpatient Glycemic Control (2013)  Target Ranges:  Prepandial:   less than 140 mg/dL      Peak postprandial:   less than 180 mg/dL (1-2 hours)      Critically ill patients:  140 - 180 mg/dL   Results for [name], SIELOFF (MRN [id number]) as of 03/15/2014 10:37  Ref. Range 03/14/2014 06:09 03/14/2014 11:42 03/14/2014 16:36 03/14/2014 20:44 03/15/2014 06:04  Glucose-Capillary Latest Range: 70-99 mg/dL 406 (H) 316 (H) 328 (H) 303 (H) 249 (H)   Diabetes history: DM2 Outpatient Diabetes medications: Lantus 23 units QHS, Glipizide 10 mg QAM, Metformin 1000 mg BID Current orders for Inpatient glycemic control: Lantus 30 units QHS, Novolog 0-15 units AC, Novolog 0-5 units HS  Inpatient Diabetes Program Recommendations Correction (SSI): Please consider increasing Novolog correction to Resistant scale. Insulin - Meal Coverage: While inpatient and ordered steroids, please consider ordering Novolog 5 units TID with meals for meal coverage.  Note: Noted patient is ordered steroids which is likely cause of hyperglycemia. Patient received Solumedrol 125 mg on 6/3 at 22:33 and then started on Prednisone 60 mg QAM on 6/4. Fasting 249 mg/dl this morning. Please consider increasing Novolog correction to resistant scale and ordering Novolog 5 units TID with meals for meal coverage.  Thanks, Barnie Alderman, RN, MSN, CCRN Diabetes Coordinator Inpatient Diabetes Program [phone] (Team Pager) [phone] ([facility] office) [phone] [facility] office)

## 2014-03-16 DIAGNOSIS — J441 Chronic obstructive pulmonary disease with (acute) exacerbation: Secondary | ICD-10-CM | POA: Diagnosis not present

## 2014-03-16 DIAGNOSIS — D869 Sarcoidosis, unspecified: Secondary | ICD-10-CM | POA: Diagnosis not present

## 2014-03-16 DIAGNOSIS — J189 Pneumonia, unspecified organism: Secondary | ICD-10-CM | POA: Diagnosis not present

## 2014-03-16 DIAGNOSIS — E1149 Type 2 diabetes mellitus with other diabetic neurological complication: Secondary | ICD-10-CM | POA: Diagnosis not present

## 2014-03-16 LAB — BASIC METABOLIC PANEL
BUN: 18 mg/dL (ref 6–23)
CO2: 28 mEq/L (ref 19–32)
Calcium: 9.1 mg/dL (ref 8.4–10.5)
Chloride: 97 mEq/L (ref 96–112)
Creatinine, Ser: 0.63 mg/dL (ref 0.50–1.10)
GFR calc Af Amer: >90 mL/min (ref >90)
GFR calc non Af Amer: >90 mL/min (ref >90)
Glucose, Bld: 209 mg/dL — ABNORMAL HIGH (ref 70–99)
Potassium: 3.7 mEq/L (ref 3.7–5.3)
Sodium: 139 mEq/L (ref 137–147)

## 2014-03-16 LAB — CBC
HCT: 37 % (ref 36.0–46.0)
Hemoglobin: 11.6 g/dL — ABNORMAL LOW (ref 12.0–15.0)
MCH: 28.4 pg (ref 26.0–34.0)
MCHC: 31.4 g/dL (ref 30.0–36.0)
MCV: 90.5 fL (ref 78.0–100.0)
Platelets: 351 10*3/uL (ref 150–400)
RBC: 4.09 MIL/uL (ref 3.87–5.11)
RDW: 14 % (ref 11.5–15.5)
WBC: 14.8 10*3/uL — ABNORMAL HIGH (ref 4.0–10.5)

## 2014-03-16 LAB — GLUCOSE, CAPILLARY
Glucose-Capillary: 208 mg/dL — ABNORMAL HIGH (ref 70–99)
Glucose-Capillary: 313 mg/dL — ABNORMAL HIGH (ref 70–99)

## 2014-03-16 LAB — CULTURE, RESPIRATORY W GRAM STAIN: Culture: NORMAL

## 2014-03-16 MED ORDER — PREDNISONE (PAK) 10 MG PO TABS: ORAL_TABLET | ORAL | Status: DC

## 2014-03-16 MED ORDER — LEVOFLOXACIN 750 MG PO TABS: 750.0000 mg | ORAL_TABLET | Freq: Every day | ORAL | Status: DC

## 2014-03-16 NOTE — Discharge Summary (Signed)
Physician Discharge Summary  [name] PIR:[id number] DOB: 1961/08/05 DOA: 03/13/2014  PCP: Chancy Hurter, MD  Admit date: 03/13/2014 Discharge date: 03/16/2014  Time spent: 35 minutes  Recommendations for Outpatient Follow-up:  1. Follow up on blood sugars, patient having hyperglycemia likely secondary to steroid use 2. Follow up on repeat CBC in 1 week  Discharge Diagnoses:  Principal Problem:   COPD with acute exacerbation Active Problems:   Sarcoidosis of lung   HYPERTENSION   GERD   SLEEP APNEA   Type II diabetes mellitus with neurological manifestations, uncontrolled   Chronic cough   Chronic diastolic heart failure, NYHA class 2   Vocal cord dysfunction   Discharge Condition: Improved  Diet recommendation: Heart Healthy diet  Filed Weights   03/14/14 0251 03/15/14 0525 03/16/14 0620  Weight: 118.1 kg (260 lb 5.8 oz) 117.572 kg (259 lb 3.2 oz) 117.2 kg (258 lb 6.1 oz)    History of present illness:  [name] is a 53 y.o. female with Past medical history of diabetes mellitus, COPD, sarcoidosis, hypertension, sleep apnea, diastolic dysfunction.  Patient presented with complaints of shortness of breath. Along with cough. She mentions that 2 weeks ago she was treated with one dose of IM Solu-Medrol and antibiotic for bronchitis leading to COPD exacerbation. With that her symptoms improved significantly. Since last 34 days she started having complaints of dry cough and progressively worsening shortness of breath. On the day of her arrival to ED her shortness of breath was significantly worse even at rest and it was not improving with her nebulizers.  She was given continuous albuterol nebs and magnesium sulfate despite which she did not felt better and therefore she was recommended for admission.   Hospital Course:  Patient is a pleasant 53 year old female with a past medical history asthma/COPD, admitted to the medicine service on 03/14/2014 presenting with  complaints of increasing shortness of breath, wheezing, cough. Symptoms felt to be secondary to COPD/asthmatic exacerbation. She was initially started on IV steroids, IV Levaquin, and scheduled meds. Hospitalization was complicated by the development of hyperglycemia likely related to steroid administration. She showed gradual clinical improvement and by 03/16/2014 she reported doing well. She was ambulated down the hallway without the need for supplemental oxygen. Lung exam demonstrated resolution to bilateral expiratory wheezes. She was discharged in stable condition on 03/16/2014   Discharge Exam: Filed Vitals:   03/16/14 0822  BP: 129/61  Pulse: 61  Temp: 98.3 F (36.8 C)  Resp: 18    General: No acute distress, awake and alert, oriented x 3 Cardiovascular: Regular rate and rhythm, normal S1S2 Respiratory: Significant improvement to lung exam with resolution of wheezes. Normal inspiratory effort Abdomen: Soft nontender nondistended  Discharge Instructions You were cared for by a hospitalist during your hospital stay. If you have any questions about your discharge medications or the care you received while you were in the hospital after you are discharged, you can call the unit and asked to speak with the hospitalist on call if the hospitalist that took care of you is not available. Once you are discharged, your primary care physician will handle any further medical issues. Please note that NO REFILLS for any discharge medications will be authorized once you are discharged, as it is imperative that you return to your primary care physician (or establish a relationship with a primary care physician if you do not have one) for your aftercare needs so that they can reassess your need for medications and monitor  your lab values.  Discharge Instructions   Call MD for:  difficulty breathing, headache or visual disturbances    Complete by:  As directed      Call MD for:  extreme fatigue     Complete by:  As directed      Call MD for:  persistant dizziness or light-headedness    Complete by:  As directed      Call MD for:  persistant nausea and vomiting    Complete by:  As directed      Call MD for:  severe uncontrolled pain    Complete by:  As directed      Call MD for:  temperature >100.4    Complete by:  As directed      Diet - low sodium heart healthy    Complete by:  As directed      Increase activity slowly    Complete by:  As directed             Medication List    STOP taking these medications       azithromycin 250 MG tablet  Commonly known as:  ZITHROMAX     benzonatate 200 MG capsule  Commonly known as:  TESSALON     fluticasone 50 MCG/ACT nasal spray  Commonly known as:  FLONASE     predniSONE 5 MG tablet  Commonly known as:  DELTASONE  Replaced by:  predniSONE 10 MG tablet      TAKE these medications       albuterol 108 (90 BASE) MCG/ACT inhaler  Commonly known as:  PROVENTIL HFA;VENTOLIN HFA  Inhale 2 puffs into the lungs every 6 (six) hours as needed. For wheezing.     albuterol (5 MG/ML) 0.5% nebulizer solution  Commonly known as:  PROVENTIL  Take 2.5 mg by nebulization every 6 (six) hours as needed. For wheezing.     allopurinol 100 MG tablet  Commonly known as:  ZYLOPRIM  Take 100 mg by mouth at bedtime.     aspirin EC 81 MG tablet  Take 81 mg by mouth daily.     atorvastatin 40 MG tablet  Commonly known as:  LIPITOR  Take 40 mg by mouth at bedtime.     B-D ULTRAFINE III SHORT PEN 31G X 8 MM Misc  Generic drug:  Insulin Pen Needle  Follow directions provided by physician     BRISDELLE 7.5 MG Caps  Generic drug:  PARoxetine Mesylate  Take 7.5 mg by mouth daily.     buPROPion 300 MG 24 hr tablet  Commonly known as:  WELLBUTRIN XL  Take 300 mg by mouth daily.     Canagliflozin 300 MG Tabs  Take 1 tablet by mouth daily. Take 300 mg by mouth daily in am.     carvedilol 25 MG tablet  Commonly known as:  COREG  Take 1  tablet (25 mg total) by mouth 2 (two) times daily with a meal.     chlorpheniramine-HYDROcodone 10-8 MG/5ML Lqcr  Commonly known as:  TUSSIONEX  Take 5 mLs by mouth every 12 (twelve) hours as needed for cough.     cholecalciferol 1000 UNITS tablet  Commonly known as:  VITAMIN D  Take 1,000 Units by mouth 2 (two) times daily.     DEXILANT 60 MG capsule  Generic drug:  dexlansoprazole  Take one capsule by mouth one time daily     diltiazem 120 MG 24 hr capsule  Commonly known as:  CARDIZEM  CD  Take 1 capsule (120 mg total) by mouth every evening.     estradiol 2 MG tablet  Commonly known as:  ESTRACE  Take 2 mg by mouth at bedtime.     fenofibrate 145 MG tablet  Commonly known as:  TRICOR  Take one tablet by mouth one time daily     FREESTYLE FREEDOM LITE W/DEVICE Kit  Use as adviced     furosemide 40 MG tablet  Commonly known as:  LASIX  Take one tablet by mouth twice daily     glipiZIDE 10 MG 24 hr tablet  Commonly known as:  GLIPIZIDE XL  Take 1 tablet (10 mg total) by mouth daily with breakfast.     glucose blood test strip  Commonly known as:  ONE TOUCH ULTRA TEST  1 each by Other route 3 (three) times daily. Use as instructed     insulin glargine 100 UNIT/ML injection  Commonly known as:  LANTUS  Inject 23 Units into the skin at bedtime.     levofloxacin 750 MG tablet  Commonly known as:  LEVAQUIN  Take 1 tablet (750 mg total) by mouth daily.     LORazepam 0.5 MG tablet  Commonly known as:  ATIVAN  Take 1 tablet (0.5 mg total) by mouth 2 (two) times daily as needed for anxiety.     losartan 100 MG tablet  Commonly known as:  COZAAR  Take one tablet by mouth one time daily     metFORMIN 1000 MG tablet  Commonly known as:  GLUCOPHAGE  Take 1 tablet (1,000 mg total) by mouth 2 (two) times daily with a meal.     mometasone-formoterol 200-5 MCG/ACT Aero  Commonly known as:  DULERA  Inhale 2 puffs into the lungs 2 (two) times daily.     nitroGLYCERIN 0.4  MG SL tablet  Commonly known as:  NITROSTAT  Place 0.4 mg under the tongue every 5 (five) minutes as needed. x3 doses as needed for chest pain.     nystatin-triamcinolone ointment  Commonly known as:  MYCOLOG  Apply 1 application topically 2 (two) times daily.     omeprazole 20 MG capsule  Commonly known as:  PRILOSEC  Take 1 capsule (20 mg total) by mouth 2 (two) times daily before a meal.     potassium chloride SA 20 MEQ tablet  Commonly known as:  K-DUR,KLOR-CON  Take 10 mEq by mouth 3 (three) times daily.     predniSONE 10 MG tablet  Commonly known as:  STERAPRED UNI-PAK  Take 6-5-4-3-2-1 tablets by mouth daily till gone.     sucralfate 1 GM/10ML suspension  Commonly known as:  CARAFATE  Take 10 mLs (1 g total) by mouth 4 (four) times daily -  with meals and at bedtime.     venlafaxine XR 75 MG 24 hr capsule  Commonly known as:  EFFEXOR-XR  Take 150 mg by mouth at bedtime.       Allergies  Allergen Reactions  . Lisinopril Cough  . Methotrexate     REACTION: peri-oral and buccal lesions.  . Clindamycin/Lincomycin Nausea And Vomiting and Rash       Follow-up Information   Follow up with Chancy Hurter, MD In 1 week.   Specialties:  Internal Medicine, Radiology   Contact information:   [place] [address] [phone]        The results of significant diagnostics from this hospitalization (including imaging, microbiology, ancillary and laboratory) are  listed below for reference.    Significant Diagnostic Studies: Dg Chest 2 View  03/13/2014   CLINICAL DATA:  Shortness of breath  EXAM: CHEST  2 VIEW  COMPARISON:  12/02/2013  FINDINGS: Stable mild cardiomegaly. No change in upper mediastinal contours. There is chronic pulmonary hypoaeration. Increased density at the medial right base correlates with mediastinum fat on preceding CT. No consolidation, edema, effusion, or pneumothorax.  IMPRESSION: No active cardiopulmonary disease.    Electronically Signed   By: Jorje Guild M.D.   On: 03/13/2014 22:22    Microbiology: Recent Results (from the past 240 hour(s))  CULTURE, EXPECTORATED SPUTUM-ASSESSMENT     Status: None   Collection Time    03/14/14  6:00 AM      Result Value Ref Range Status   Specimen Description SPUTUM   Final   Special Requests NONE   Final   Sputum evaluation     Final   Value: THIS SPECIMEN IS ACCEPTABLE. RESPIRATORY CULTURE REPORT TO FOLLOW.   Report Status 03/14/2014 FINAL   Final  CULTURE, RESPIRATORY (NON-EXPECTORATED)     Status: None   Collection Time    03/14/14  6:00 AM      Result Value Ref Range Status   Specimen Description SPUTUM   Final   Special Requests NONE   Final   Gram Stain     Final   Value: MODERATE WBC PRESENT,BOTH PMN AND MONONUCLEAR     RARE SQUAMOUS EPITHELIAL CELLS PRESENT     RARE GRAM POSITIVE COCCI IN PAIRS     Performed at [facility]   Culture     Final   Value: NORMAL OROPHARYNGEAL FLORA     Performed at [facility]   Report Status PENDING   Incomplete     Labs: Basic Metabolic Panel:  Recent Labs Lab 03/13/14 2129 03/14/14 0449 03/15/14 0640 03/16/14 0500  NA 136* 142 142 139  K 3.6* 3.5* 4.2 3.7  CL 96 97 100 97  CO2 22 23 29 28   GLUCOSE 319* 417* 223* 209*  BUN 20 19 22 18   CREATININE 0.71 0.64 0.65 0.63  CALCIUM 9.8 9.2 8.9 9.1  MG 1.7  --   --   --    Liver Function Tests:  Recent Labs Lab 03/14/14 0449  AST 13  ALT 16  ALKPHOS 70  BILITOT 0.3  PROT 7.0  ALBUMIN 3.7   No results found for this basename: LIPASE, AMYLASE,  in the last 168 hours No results found for this basename: AMMONIA,  in the last 168 hours CBC:  Recent Labs Lab 03/13/14 2129 03/14/14 0449 03/15/14 0640 03/16/14 0500  WBC 15.0* 15.0* 15.2* 14.8*  NEUTROABS 11.0*  --   --   --   HGB 12.2 11.9* 11.6* 11.6*  HCT 36.4 36.3 36.5 37.0  MCV 87.9 89.0 91.7 90.5  PLT 382 352 354 351   Cardiac Enzymes:  Recent Labs Lab  03/13/14 2129  TROPONINI <0.30   BNP: BNP (last 3 results)  Recent Labs  09/18/13 0019 12/02/13 2017 03/13/14 2129  PROBNP 93.8 63.5 95.1   CBG:  Recent Labs Lab 03/15/14 0604 03/15/14 1158 03/15/14 1652 03/15/14 2206 03/16/14 0606  GLUCAP 249* 300* 323* 277* 208*       Signed:  Kelvin Cellar  [facility] 03/16/2014, 10:39 AM

## 2014-03-19 ENCOUNTER — Other Ambulatory Visit: Payer: Self-pay | Admitting: Internal Medicine

## 2014-03-21 ENCOUNTER — Telehealth: Payer: Self-pay | Admitting: Critical Care Medicine

## 2014-03-21 NOTE — Telephone Encounter (Signed)
Received surgery clearance request from [facility] for this pt for a left ORIF 5th metatarsal fx.  Per fax, this is URGENT.  Since last OV in May with PW, pt has been admitted to hospital d/t breathing.  Per PW:  Pt will need OV 1st before clearance can be determined.  I have faxed this back to [place].    Called, spoke with pt.  Explained above to her.  She verbalized understanding.  We have scheduled her to see TP on Monday, June 15 at 10:15 for a HFU.  Pt aware and ok with this.

## 2014-03-22 ENCOUNTER — Telehealth: Payer: Self-pay

## 2014-03-22 ENCOUNTER — Ambulatory Visit (INDEPENDENT_AMBULATORY_CARE_PROVIDER_SITE_OTHER): Payer: BC Managed Care – PPO | Admitting: Physician Assistant

## 2014-03-22 ENCOUNTER — Encounter: Payer: Self-pay | Admitting: Physician Assistant

## 2014-03-22 VITALS — BP 132/72 | HR 77 | Temp 98.3°F | Resp 18 | Wt 255.0 lb

## 2014-03-22 DIAGNOSIS — Z09 Encounter for follow-up examination after completed treatment for conditions other than malignant neoplasm: Secondary | ICD-10-CM

## 2014-03-22 DIAGNOSIS — B379 Candidiasis, unspecified: Secondary | ICD-10-CM | POA: Diagnosis not present

## 2014-03-22 DIAGNOSIS — T3695XA Adverse effect of unspecified systemic antibiotic, initial encounter: Secondary | ICD-10-CM

## 2014-03-22 DIAGNOSIS — R0602 Shortness of breath: Secondary | ICD-10-CM

## 2014-03-22 MED ORDER — FLUCONAZOLE 150 MG PO TABS: 150.0000 mg | ORAL_TABLET | Freq: Once | ORAL | Status: DC

## 2014-03-22 NOTE — Progress Notes (Signed)
Subjective:    Patient ID: [name], female    DOB: 11-30-1960, 53 y.o.   MRN: [id number]  HPI Hospital Discharge F/U Admit date: 03/13/2014  Discharge date: 03/16/2014  Recommendations for Outpatient Follow-up:  1. Follow up on blood sugars, patient having hyperglycemia likely secondary to steroid use 2. Follow up on repeat CBC in 1 week Discharge Diagnoses:  Principal Problem:  COPD with acute exacerbation  Active Problems:  Sarcoidosis of lung  HYPERTENSION  GERD  SLEEP APNEA  Type II diabetes mellitus with neurological manifestations, uncontrolled  Chronic cough  Chronic diastolic heart failure, NYHA class 2  Vocal cord dysfunction  Discharge Condition: Improved  Diet recommendation: Heart Healthy diet  History of present illness:  [name] is a 53 y.o. female with Past medical history of diabetes mellitus, COPD, sarcoidosis, hypertension, sleep apnea, diastolic dysfunction.  Patient presented with complaints of shortness of breath. Along with cough. She mentions that 2 weeks ago she was treated with one dose of IM Solu-Medrol and antibiotic for bronchitis leading to COPD exacerbation. With that her symptoms improved significantly. Since last 34 days she started having complaints of dry cough and progressively worsening shortness of breath. On the day of her arrival to ED her shortness of breath was significantly worse even at rest and it was not improving with her nebulizers.  She was given continuous albuterol nebs and magnesium sulfate despite which she did not felt better and therefore she was recommended for admission.  Hospital Course:  Patient is a pleasant 53 year old female with a past medical history asthma/COPD, admitted to the medicine service on 03/14/2014 presenting with complaints of increasing shortness of breath, wheezing, cough. Symptoms felt to be secondary to COPD/asthmatic exacerbation. She was initially started on IV steroids, IV Levaquin, and  scheduled meds. Hospitalization was complicated by the development of hyperglycemia likely related to steroid administration. She showed gradual clinical improvement and by 03/16/2014 she reported doing well. She was ambulated down the hallway without the need for supplemental oxygen. Lung exam demonstrated resolution to bilateral expiratory wheezes. She was discharged in stable condition on 03/16/2014    [name] a 53 y/o caucasian female presenting for hopsital f/u of SOB. Pt history sarcoidosis, and COPD. Pt was sent home on antibiotic and prednisone taper. She has completed the antibiotic course, and has almost completed the prednisone taper. She is tolerating both medications well. [name] normally on low dose prednisone for Sarcoidosis. She denies adverse effects to any medications. No other changes were made to her medications. Pt states that she feels back to normal now. Her main symptom at admission was SOB, and this has since completely resolved per pt. Pts only complaint at this time is vaginal itching and discharge. Pt believes that this is a yeast infection due to the antibiotic course, as she has had the same with antibiotics in the past.  Patient denies fevers, chills, nausea, vomiting, diarrhea, shortness of breath, chest pain, headache, syncope.   Review of Systems As per HPI and are otherwise negative.     Past Medical History  Diagnosis Date  . DIABETES MELLITUS, TYPE II 08/21/2006  . HYPERLIPIDEMIA 08/21/2006  . GOUT 08/20/2010  . OBESITY 06/04/2009  . ANEMIA-UNSPECIFIED 09/18/2009  . HYPERTENSION 08/21/2006  . GERD 08/21/2006  . SLEEP APNEA 04/21/2009    last testing was negative  . Internal hemorrhoids   . Pulmonary sarcoidosis     Followed locally by pulmonology, but also by Dr. Casper Harrison at [facility]  . Exertional chest pain     sharp, substernal, exertional  . Vocal cord dysfunction   . Chronic diastolic heart failure, NYHA class 2     LVEDP roughly 20% by  cath  . CHF (congestive heart failure)    Past Surgical History  Procedure Laterality Date  . Ventral hernia repair    . Nissen fundoplication  1224  . Cholecystectomy  1984  . Abdominal hysterectomy    . Knee arthroscopy      right  . Tubal ligation      with reversal in 1994  . Bladder suspension  11/11/2011    Procedure: TRANSVAGINAL TAPE (TVT) PROCEDURE;  Surgeon: Olga Millers, MD;  Location: [facility];  Service: Gynecology;  Laterality: N/A;  . Cystoscopy  11/11/2011    Procedure: CYSTOSCOPY;  Surgeon: Olga Millers, MD;  Location: [facility];  Service: Gynecology;  Laterality: N/A;  . Doppler echocardiography  02/12/2013    LV FUNCTION, SIZE NORMAL; MILD CONCENTRIC LVH; EST EF 55-65%; WALL MOTION NORMAL  . Lexiscan myoview  03/09/2013    EF 50%; NORMAL MYOCARDIAL PERFUSION STUDY - breast attenuation  . Cardiac catheterization  07/2010    LVEF 50-55% WITH VERY MILD GLOBAL HYPOKINESIA; ESSENTIALLY NORMAL CORONARY ARTERIES; NORMAL LV FUNCTION  . Carotids  02/18/11    CAROTID DUPLEX; VERTEBRALS ARE PATENT WITH ANTEGRADE FLOW. ICA/CCA RATIO 1.61 ON RIGHT AND 0.75 ON LEFT  . Right and left cardiac catheterization  04/23/2013    Angiographic normal coronaries; LVEDP 20 mmHg, PCWP 12-14 mmHg, RAP 12 mmHg.; Fick CO/CI 4.9/2.2    reports that she has never smoked. She has never used smokeless tobacco. She reports that she does not drink alcohol or use illicit drugs. family history includes Asthma in her mother; COPD in her mother; Cancer in her brother; Coronary artery disease in her father; Diabetes in her brother and father; Emphysema in her mother; Heart attack in her father and maternal grandfather; Heart failure in her father and mother; Lung cancer in her brother; Sarcoidosis in her maternal uncle. There is no history of Colon cancer. Allergies  Allergen Reactions  . Lisinopril Cough  . Methotrexate     REACTION: peri-oral and buccal lesions.  . Clindamycin/Lincomycin Nausea And Vomiting  and Rash       Objective:   Physical Exam  Nursing note and vitals reviewed. Constitutional: She is oriented to person, place, and time. She appears well-developed and well-nourished. No distress.  HENT:  Head: Normocephalic and atraumatic.  Eyes: Conjunctivae and EOM are normal. Pupils are equal, round, and reactive to light.  Neck: Normal range of motion. Neck supple.  Cardiovascular: Normal rate, regular rhythm, normal heart sounds and intact distal pulses.  Exam reveals no gallop and no friction rub.   No murmur heard. Pulmonary/Chest: Effort normal and breath sounds normal. No respiratory distress. She has no wheezes. She has no rales. She exhibits no tenderness.  Musculoskeletal: Normal range of motion.  Neurological: She is alert and oriented to person, place, and time.  Skin: Skin is warm and dry. No rash noted. She is not diaphoretic. No erythema. No pallor.  Psychiatric: She has a normal mood and affect. Her behavior is normal. Judgment and thought content normal.     Filed Vitals:   03/22/14 1014  BP: 132/72  Pulse: 77  Temp: 98.3 F (36.8 C)  TempSrc: Oral  Resp: 18  Weight: 255 lb (115.667 kg)  SpO2: 98%   Lab Results  Component Value Date   WBC 14.8* 03/16/2014   HGB 11.6* 03/16/2014   HCT 37.0 03/16/2014   PLT 351 03/16/2014   GLUCOSE 209* 03/16/2014   CHOL 211* 12/10/2013   TRIG 508.0* 12/10/2013   HDL 44.80 12/10/2013   LDLDIRECT 117.9 12/10/2013   LDLCALC UNABLE TO CALCULATE IF TRIGLYCERIDE OVER 400 mg/dL 05/28/2011   ALT 16 03/14/2014   AST 13 03/14/2014   NA 139 03/16/2014   K 3.7 03/16/2014   CL 97 03/16/2014   CREATININE 0.63 03/16/2014   BUN 18 03/16/2014   CO2 28 03/16/2014   TSH 3.36 07/24/2013   INR 1.04 03/14/2014   HGBA1C 8.7* 12/10/2013   MICROALBUR 0.2 12/10/2013        Assessment & Plan:  [name] was seen today for hospitalization follow-up.  Diagnoses and associated orders for this visit:  Hospital discharge follow-up  SOB (shortness of breath) Comments:  Resolved. COntinue prednisone taper to completion.  Antibiotic-induced yeast infection Comments: Hx of the same. Symptoms consistent with previous Yeast infections. Will treat with Diflucan. - fluconazole (DIFLUCAN) 150 MG tablet; Take 1 tablet (150 mg total) by mouth once.   Pt needs repeat lab work per [employer]. Is not fasting today, will return fasting for blood work. Pt was experiencing hyperglycemia thought to be due to steroids, and had a high WBC.   Plan to follow up first of next week with PCP for Preop clearance.  Plan to follow up otherwise as needed, or for worsening or persistent symptoms despite treatment.  Patient Instructions  Fluconazole 1 pill once for yeast infection to to antibiotic use. If after 3 days yeast infection persists, may refill one time for additional dose.  Continue all current medications. Continue prednisone taper.  Followup for preop clearance next week with primary care provider.  Followup as needed for all other symptoms, or for worsening or persistent symptoms.

## 2014-03-22 NOTE — Patient Instructions (Signed)
Fluconazole 1 pill once for yeast infection to to antibiotic use. If after 3 days yeast infection persists, may refill one time for additional dose.  Continue all current medications. Continue prednisone taper.  Followup for preop clearance next week with primary care provider.  Followup as needed for all other symptoms, or for worsening or persistent symptoms.   Shortness of Breath Shortness of breath means you have trouble breathing. Shortness of breath needs medical care right away. HOME CARE   Do not smoke.  Avoid being around chemicals or things (paint fumes, dust) that may bother your breathing.  Rest as needed. Slowly begin your normal activities.  Only take medicines as told by your doctor.  Keep all doctor visits as told. GET HELP RIGHT AWAY IF:   Your shortness of breath gets worse.  You feel lightheaded, pass out (faint), or have a cough that is not helped by medicine.  You cough up blood.  You have pain with breathing.  You have pain in your chest, arms, shoulders, or belly (abdomen).  You have a fever.  You cannot walk up stairs or exercise the way you normally do.  You do not get better in the time expected.  You have a hard time doing normal activities even with rest.  You have problems with your medicines.  You have any new symptoms. MAKE SURE YOU:  Understand these instructions.  Will watch your condition.  Will get help right away if you are not doing well or get worse. Document Released: 03/15/2008 Document Revised: 03/28/2012 Document Reviewed: 12/13/2011 [facility] Patient Information 2014 [place], [place].

## 2014-03-22 NOTE — Progress Notes (Signed)
Pre visit review using our clinic review tool, if applicable. No additional management support is needed unless otherwise documented below in the visit note. 

## 2014-03-22 NOTE — Telephone Encounter (Signed)
Per Rodman Key pt needs to come back for lab work next week.  Pt needs a fasting glucose and other labs to ensure infection has resolved.

## 2014-03-25 ENCOUNTER — Encounter: Payer: Self-pay | Admitting: Adult Health

## 2014-03-25 ENCOUNTER — Ambulatory Visit (INDEPENDENT_AMBULATORY_CARE_PROVIDER_SITE_OTHER): Payer: BC Managed Care – PPO | Admitting: Adult Health

## 2014-03-25 VITALS — BP 132/66 | HR 80 | Temp 98.4°F | Ht 66.0 in | Wt 261.4 lb

## 2014-03-25 DIAGNOSIS — Z01818 Encounter for other preprocedural examination: Secondary | ICD-10-CM | POA: Diagnosis not present

## 2014-03-25 DIAGNOSIS — J4489 Other specified chronic obstructive pulmonary disease: Secondary | ICD-10-CM

## 2014-03-25 DIAGNOSIS — J99 Respiratory disorders in diseases classified elsewhere: Secondary | ICD-10-CM | POA: Diagnosis not present

## 2014-03-25 DIAGNOSIS — D860 Sarcoidosis of lung: Secondary | ICD-10-CM

## 2014-03-25 DIAGNOSIS — D869 Sarcoidosis, unspecified: Secondary | ICD-10-CM

## 2014-03-25 DIAGNOSIS — J449 Chronic obstructive pulmonary disease, unspecified: Secondary | ICD-10-CM

## 2014-03-25 NOTE — Patient Instructions (Signed)
Continue on current regimen .  You are cleared from a pulmonary standpoint for surgery  Follow up with your family doctor for surgical clearance as planned  Follow up Dr. Joya Gaskins  In 6 weeks and .As needed   Please contact office for sooner follow up if symptoms do not improve or worsen or seek emergency care

## 2014-03-25 NOTE — Assessment & Plan Note (Signed)
Recent flare w/ hospital admission now improved and back to baseline

## 2014-03-25 NOTE — Assessment & Plan Note (Addendum)
She has an upcoming surgery scheduled with orthopedics. Planned for a left ORIF of the fifth metatarsal secondary to fracture. She requires, pulmonary, preop clearance . Pt appears stable for her underlying sarcoidosis managed on chronic steroids along with  Spirometry appears stable .  She will be at higher risk for healing standpoint d/t underlying chronic steroid use.  Case reviewed in detail with Dr. Joya Gaskins  And pt is cleared from pulmonary standpoint for surgery .  She is to follow up with PCP as planned to clear from their standpoint as she has several co-morbidities .

## 2014-03-25 NOTE — Progress Notes (Signed)
Subjective:    Patient ID: [name], female    DOB: Nov 22, 1960, 53 y.o.   MRN: [id number]  HPI  14 WF never smoker with Sarcoid, GERD, VCD, cyclical cough     5/57/3220  [facility] follow up  Patient returns for a post hospital followup. Patient was admitted June 3 for asthmatic bronchitic exacerbation. She was treated with IV antibiotics, steroids, and nebulized bronchodilators. She was discharged on Levaquin and prednisone taper. She is finished all of her antibiotics and is now back to her prednisone 10 mg daily. Doses for her chronic steroids for sarcoid. Since discharge. Patient says that she is feeling much improved with decreased cough, congestion, and wheezing. She denies any hemoptysis, orthopnea, PND, leg swelling, bloody stools, or diarrhea.  She has an upcoming surgery scheduled with orthopedics. Planned for a  left ORIF of the fifth metatarsal secondary to fracture. She requires, pulmonary, preop clearance . Patient has underlying sarcoidosis managed on chronic steroids. She has a history of reactive airways/VCD  with cyclical cough. Patient is followed [facility] for her sarcoidosis. Previous , PFT, and 2013 was reviewed with restrictive changes noted with, FVC at 68%. In office spirometry today shows moderate restriction with an FVC of 60%, FEV1 at 59%, and a ratio of 79. Patient is not on any supplemental oxygen, O2 saturation in the office is  98% on room air.  She reports previous sleep study was negative for sleep apnea, however, could not find these results in the computer system. She denies any previous use of CPAP .  CT chest 01/21/14 showed stable RUL nodule x 2 yrs. No hilar adenopathy.     Review of Systems Constitutional:   No  weight loss, night sweats,  Fevers, chills, fatigue, or  lassitude.  HEENT:   No headaches,  Difficulty swallowing,  Tooth/dental problems, or  Sore throat,                No sneezing, itching, ear ache, nasal  congestion, post nasal drip,   CV:  No chest pain,  Orthopnea, PND, swelling in lower extremities, anasarca, dizziness, palpitations, syncope.   GI  No heartburn, indigestion, abdominal pain, nausea, vomiting, diarrhea, change in bowel habits, loss of appetite, bloody stools.   Resp: No shortness of breath with exertion or at rest.  No excess mucus, no productive cough,  No non-productive cough,  No coughing up of blood.  No change in color of mucus.  No wheezing.  No chest wall deformity  Skin: no rash or lesions.  GU: no dysuria, change in color of urine, no urgency or frequency.  No flank pain, no hematuria   MS:  No joint pain or swelling.  No decreased range of motion.  + foot pain   Psych:  No change in mood or affect. No depression or anxiety.  No memory loss.        '    Objective:   Physical Exam  GEN: A/Ox3; pleasant , NAD   HEENT:  [place]/AT,  EACs-clear, TMs-wnl, NOSE-clear drainage THROAT-clear, no lesions, no postnasal drip or exudate noted.   NECK:  Supple w/ fair ROM; no JVD; normal carotid impulses w/o bruits; no thyromegaly or nodules palpated; no lymphadenopathy.  RESP  Diminished BS in bases, w/ no wheezing noted   CARD:  RRR, no m/r/g  , no peripheral edema, pulses intact, no cyanosis or clubbing.  GI:   Soft & nt; nml bowel sounds; no organomegaly or masses  detected.  Musco: Warm bil, no deformities or joint swelling noted.   Neuro: alert, no focal deficits noted.    Skin: Warm, no lesions or rashes         Assessment & Plan:

## 2014-03-25 NOTE — Telephone Encounter (Signed)
Left a message for return call.  

## 2014-03-25 NOTE — Assessment & Plan Note (Signed)
Appears stable on chronic steroid use w/ prednisone 10mg  daily  No apparent flare .  Cont current regimen  Spirometry stable.

## 2014-03-26 ENCOUNTER — Ambulatory Visit (INDEPENDENT_AMBULATORY_CARE_PROVIDER_SITE_OTHER): Payer: BC Managed Care – PPO | Admitting: Internal Medicine

## 2014-03-26 ENCOUNTER — Encounter: Payer: Self-pay | Admitting: *Deleted

## 2014-03-26 ENCOUNTER — Encounter: Payer: Self-pay | Admitting: Internal Medicine

## 2014-03-26 ENCOUNTER — Other Ambulatory Visit: Payer: BC Managed Care – PPO

## 2014-03-26 VITALS — BP 132/74 | HR 82 | Temp 98.4°F | Resp 12 | Wt 260.0 lb

## 2014-03-26 DIAGNOSIS — E1149 Type 2 diabetes mellitus with other diabetic neurological complication: Secondary | ICD-10-CM

## 2014-03-26 DIAGNOSIS — E1165 Type 2 diabetes mellitus with hyperglycemia: Secondary | ICD-10-CM

## 2014-03-26 DIAGNOSIS — IMO0002 Reserved for concepts with insufficient information to code with codable children: Secondary | ICD-10-CM

## 2014-03-26 MED ORDER — INSULIN ASPART 100 UNIT/ML FLEXPEN: 7.0000 [IU] | PEN_INJECTOR | Freq: Three times a day (TID) | SUBCUTANEOUS | Status: DC

## 2014-03-26 MED ORDER — INSULIN GLARGINE 100 UNIT/ML ~~LOC~~ SOLN: 28.0000 [IU] | Freq: Every day | SUBCUTANEOUS | Status: DC

## 2014-03-26 NOTE — Progress Notes (Signed)
Patient ID: [name], female   DOB: Aug 29, 1961, 53 y.o.   MRN: [id number]  HPI: [name] is a 53 y.o.-year-old female, returning for f/u DM2, dx 2007, insulin-dependent, uncontrolled, with complications (CHF stage 2, peripheral neuropathy). Last visit 2 mo ago.  She was hospitalized for COPD exacerbation >> restarted a high dose Prednisone taper.  She will have surgery on the left foot soon.   Last hemoglobin A1c was: Lab Results  Component Value Date   HGBA1C 8.7* 12/10/2013   HGBA1C 7.9* 07/24/2013   HGBA1C 7.2* 03/06/2013  She is on Prednisone 10 mg for sarcoidosis.   [name] on a regimen of: - Metformin 1000 mg po bid >> tolerates it well. She is on a B-complex. - Glipizide XL 10 mg in am - Lantus 23 units qhs - pen - Invokana 300 mg daily - added 01/2014  -had one yeast infection We stopped Glyburide 2.5 mg at last visit.   Pt checks her sugars 0-1 a day and they are: - am: 130-260 >> 109-262 (higher sugars after Prednisone) >>115-258 (higher range when on Prednisone) - 2h after b'fast: n/c >> 200, 315 >> n/c - before lunch: n/c >> n/c >> n/c - 2h after lunch: n/c >> n/c >> 414 - before dinner: n/c >> 230 >> n/c - 2h after dinner: n/c >> 245 >. 229, 247 - bedtime: 200s >> 241-462 (mostly 200s) >> 199-395 - nighttime: n/c No lows. Lowest sugar was 115; she has hypoglycemia awareness at 70s.  Highest sugar was 400s >> feels fatigued then, with blurry vision, and dry mouth.   Meter: Freestyle Freedom Lite.   She had a nutrition class >> helped.   Pt's meals are: - Breakfast: egg + bacon or if go out: bisquit - Lunch: sandwich - Dinner: meat + veggies + starch - Snacks: pork skins and cookies - 1-2 a day; also after dinner No exercise.  - no CKD, last BUN/creatinine:  Lab Results  Component Value Date   BUN 18 03/16/2014   CREATININE 0.63 03/16/2014  On Losartan. - last set of lipids: Lab Results  Component Value Date   CHOL 211* 12/10/2013   HDL 44.80  12/10/2013   LDLCALC UNABLE TO CALCULATE IF TRIGLYCERIDE OVER 400 mg/dL 05/28/2011   LDLDIRECT 117.9 12/10/2013   TRIG 508.0* 12/10/2013   CHOLHDL 5 12/10/2013  On Lipitor. - last eye exam was in 03/2013 - sees eye dr once a year. No DR.  - + numbness and tingling in her feet and legs hurt. She has restless legs.   Does have frequent urination and nocturia 2-3/night.   She also has gout, HL, HTN, sarcoidosis of lung, GERD, COPD, OSA >> resolved.  ROS: Constitutional: no weight gain/loss, no fatigue, no subjective hyperthermia/hypothermia, + nocturia x 2-3 Eyes: no blurry vision, no xerophthalmia ENT: no sore throat, no nodules palpated in throat, no dysphagia/odynophagia, no hoarseness Cardiovascular: no CP/SOB/palpitations/+ periankle swelling Respiratory: no cough/no SOB Gastrointestinal: no N/V/D/C, + heartburn Musculoskeletal: no muscle/joint aches Skin: no rashes Neurological: no tremors/+ numbness - feet/tingling/dizziness  PE: BP 132/74  Pulse 82  Temp(Src) 98.4 F (36.9 C) (Oral)  Resp 12  Wt 260 lb (117.935 kg)  SpO2 97% Wt Readings from Last 3 Encounters:  03/26/14 260 lb (117.935 kg)  03/25/14 261 lb 6.4 oz (118.57 kg)  03/22/14 255 lb (115.667 kg)   Constitutional: obese, in NAD Eyes: PERRLA, EOMI, no exophthalmos ENT: moist mucous membranes, no thyromegaly, no cervical lymphadenopathy Cardiovascular: RRR,  No MRG Respiratory: CTA B Gastrointestinal: abdomen soft, NT, ND, BS+ Musculoskeletal: no deformities, strength intact in all 4 Skin: moist, warm, no rashes Neurological: + mild tremor with outstretched hands  ASSESSMENT: 1. DM2, insulin-dependent, uncontrolled, with complications - CHF stage 2 - peripheral neuropathy  PLAN:  1. Patient with long-standing, recently more uncontrolled diabetes, on oral antidiabetic regimen + Basal insulin. Sugars are still very high especially later in the day, especially on the higher dose of Prednisone - I suggested to:   Patient Instructions  Please continue: - Metformin 1000 mg 2x a day - Invokana 300 mg daily Increase Lantus to 28 units at bedtime Add NovoLog - please inject 15 min before a meal: - 7 units with a small meal - 9 units with a regular meal - 12 units with a large meal  Please stop Glipizide XL. - continue checking sugars at different times of the day - check 3 times a day, rotating checks - she is up to date with yearly eye exams - will check HbA1c today - Return to clinic in 1 mo with sugar log   Pt did not stop at the lab.

## 2014-03-26 NOTE — Telephone Encounter (Signed)
[name] spoke with pt and pt will come in for fasting blood work.  Lab appt scheduled.

## 2014-03-26 NOTE — Patient Instructions (Signed)
Please continue: - Metformin 1000 mg 2x a day - Invokana 300 mg daily - Increase Lantus to 28 units at bedtime - Add NovoLog - please inject 15 min before a meal: - 7 units with a small meal - 9 units with a regular meal - 12 units with a large meal  - Please stop Glipizide XL.

## 2014-03-27 ENCOUNTER — Other Ambulatory Visit: Payer: Self-pay | Admitting: *Deleted

## 2014-03-27 ENCOUNTER — Telehealth: Payer: Self-pay | Admitting: Critical Care Medicine

## 2014-03-27 ENCOUNTER — Telehealth: Payer: Self-pay | Admitting: Internal Medicine

## 2014-03-27 ENCOUNTER — Other Ambulatory Visit (INDEPENDENT_AMBULATORY_CARE_PROVIDER_SITE_OTHER): Payer: BC Managed Care – PPO

## 2014-03-27 DIAGNOSIS — D649 Anemia, unspecified: Secondary | ICD-10-CM

## 2014-03-27 DIAGNOSIS — E1149 Type 2 diabetes mellitus with other diabetic neurological complication: Secondary | ICD-10-CM | POA: Diagnosis not present

## 2014-03-27 LAB — CBC WITH DIFFERENTIAL/PLATELET
Basophils Absolute: 0 10*3/uL (ref 0.0–0.1)
Basophils Relative: 0.2 % (ref 0.0–3.0)
Eosinophils Absolute: 0.1 10*3/uL (ref 0.0–0.7)
Eosinophils Relative: 1.3 % (ref 0.0–5.0)
HCT: 35.5 % — ABNORMAL LOW (ref 36.0–46.0)
Hemoglobin: 11.6 g/dL — ABNORMAL LOW (ref 12.0–15.0)
Lymphocytes Relative: 24 % (ref 12.0–46.0)
Lymphs Abs: 2.8 10*3/uL (ref 0.7–4.0)
MCHC: 32.6 g/dL (ref 30.0–36.0)
MCV: 87.9 fl (ref 78.0–100.0)
Monocytes Absolute: 0.7 10*3/uL (ref 0.1–1.0)
Monocytes Relative: 5.6 % (ref 3.0–12.0)
Neutro Abs: 8.1 10*3/uL — ABNORMAL HIGH (ref 1.4–7.7)
Neutrophils Relative %: 68.9 % (ref 43.0–77.0)
Platelets: 334 10*3/uL (ref 150.0–400.0)
RBC: 4.04 Mil/uL (ref 3.87–5.11)
RDW: 14.3 % (ref 11.5–15.5)
WBC: 11.7 10*3/uL — ABNORMAL HIGH (ref 4.0–10.5)

## 2014-03-27 LAB — GLUCOSE, POCT (MANUAL RESULT ENTRY): POC Glucose: 217 mg/dl — AB (ref 70–99)

## 2014-03-27 MED ORDER — INSULIN ASPART 100 UNIT/ML FLEXPEN: 7.0000 [IU] | PEN_INJECTOR | Freq: Three times a day (TID) | SUBCUTANEOUS | Status: DC

## 2014-03-27 NOTE — Telephone Encounter (Signed)
Patient states her Novolog was never sent to her pharmacy  [facility] on [address]   Thank you :)

## 2014-03-27 NOTE — Telephone Encounter (Signed)
It was on No Print. Resent.

## 2014-03-27 NOTE — Telephone Encounter (Signed)
Pt was seen by TP on 03/25/14 for surgery clearance. Received fax from [facility] stating Dr. Leanne Chang would like pulm clearance for surgery for left orif 5th metatarsal fx. Pt has completed this fax:  [name] cleared for surgery medically only stating pt is ready for OR. I have faxed this back to [phone] and placed in scan folder.

## 2014-03-29 ENCOUNTER — Telehealth: Payer: Self-pay | Admitting: Hematology and Oncology

## 2014-03-29 NOTE — Telephone Encounter (Signed)
S/W PT IN REF TO NP APPT. 04/17/14@9 :78 REFERRING DR BASSET DX-ABNORMAL SPEP MAILED NP PACKET

## 2014-04-01 ENCOUNTER — Telehealth: Payer: Self-pay | Admitting: Hematology and Oncology

## 2014-04-01 ENCOUNTER — Telehealth: Payer: Self-pay | Admitting: Internal Medicine

## 2014-04-01 NOTE — Telephone Encounter (Signed)
C/D 04/01/14 for appt. 04/01/14

## 2014-04-01 NOTE — Telephone Encounter (Signed)
Pt req call back about lab results .. °

## 2014-04-03 ENCOUNTER — Encounter (HOSPITAL_BASED_OUTPATIENT_CLINIC_OR_DEPARTMENT_OTHER): Payer: Self-pay | Admitting: *Deleted

## 2014-04-03 NOTE — Pre-Procedure Instructions (Signed)
[name] going to Dr. Archie Endo office tomorrow to repeat labs - pt said plts and wbc hi. Requested last office visit, EKG and any cardiac testing from [place] at [facility] cardiology.

## 2014-04-04 NOTE — Telephone Encounter (Signed)
Labs from 6/17 were ok

## 2014-04-05 NOTE — Telephone Encounter (Signed)
Pt aware.

## 2014-04-06 ENCOUNTER — Other Ambulatory Visit: Payer: Self-pay | Admitting: Internal Medicine

## 2014-04-09 ENCOUNTER — Encounter (HOSPITAL_BASED_OUTPATIENT_CLINIC_OR_DEPARTMENT_OTHER): Payer: BC Managed Care – PPO | Admitting: Anesthesiology

## 2014-04-09 ENCOUNTER — Other Ambulatory Visit: Payer: Self-pay | Admitting: Physician Assistant

## 2014-04-09 ENCOUNTER — Ambulatory Visit (HOSPITAL_BASED_OUTPATIENT_CLINIC_OR_DEPARTMENT_OTHER): Payer: BC Managed Care – PPO | Admitting: Anesthesiology

## 2014-04-09 ENCOUNTER — Ambulatory Visit (HOSPITAL_BASED_OUTPATIENT_CLINIC_OR_DEPARTMENT_OTHER)
Admission: RE | Admit: 2014-04-09 | Discharge: 2014-04-09 | Disposition: A | Discharge status: Home / Self Care | Payer: BC Managed Care – PPO | Source: Ambulatory Visit | Attending: Orthopedic Surgery | Admitting: Orthopedic Surgery

## 2014-04-09 ENCOUNTER — Encounter: Payer: Self-pay | Admitting: Physician Assistant

## 2014-04-09 ENCOUNTER — Encounter (HOSPITAL_BASED_OUTPATIENT_CLINIC_OR_DEPARTMENT_OTHER)
Admission: RE | Disposition: A | Discharge status: Home / Self Care | Payer: Self-pay | Source: Ambulatory Visit | Attending: Orthopedic Surgery

## 2014-04-09 ENCOUNTER — Encounter (HOSPITAL_BASED_OUTPATIENT_CLINIC_OR_DEPARTMENT_OTHER): Payer: Self-pay | Admitting: Anesthesiology

## 2014-04-09 DIAGNOSIS — E1169 Type 2 diabetes mellitus with other specified complication: Secondary | ICD-10-CM | POA: Diagnosis present

## 2014-04-09 DIAGNOSIS — R079 Chest pain, unspecified: Secondary | ICD-10-CM | POA: Insufficient documentation

## 2014-04-09 DIAGNOSIS — Z794 Long term (current) use of insulin: Secondary | ICD-10-CM | POA: Insufficient documentation

## 2014-04-09 DIAGNOSIS — E1159 Type 2 diabetes mellitus with other circulatory complications: Secondary | ICD-10-CM | POA: Diagnosis present

## 2014-04-09 DIAGNOSIS — K219 Gastro-esophageal reflux disease without esophagitis: Secondary | ICD-10-CM | POA: Diagnosis present

## 2014-04-09 DIAGNOSIS — I509 Heart failure, unspecified: Secondary | ICD-10-CM | POA: Insufficient documentation

## 2014-04-09 DIAGNOSIS — M109 Gout, unspecified: Secondary | ICD-10-CM | POA: Diagnosis present

## 2014-04-09 DIAGNOSIS — F329 Major depressive disorder, single episode, unspecified: Secondary | ICD-10-CM

## 2014-04-09 DIAGNOSIS — F411 Generalized anxiety disorder: Secondary | ICD-10-CM | POA: Insufficient documentation

## 2014-04-09 DIAGNOSIS — D860 Sarcoidosis of lung: Secondary | ICD-10-CM

## 2014-04-09 DIAGNOSIS — S92302K Fracture of unspecified metatarsal bone(s), left foot, subsequent encounter for fracture with nonunion: Secondary | ICD-10-CM

## 2014-04-09 DIAGNOSIS — Z6841 Body Mass Index (BMI) 40.0 and over, adult: Secondary | ICD-10-CM | POA: Insufficient documentation

## 2014-04-09 DIAGNOSIS — E119 Type 2 diabetes mellitus without complications: Secondary | ICD-10-CM

## 2014-04-09 DIAGNOSIS — J449 Chronic obstructive pulmonary disease, unspecified: Secondary | ICD-10-CM | POA: Insufficient documentation

## 2014-04-09 DIAGNOSIS — G4730 Sleep apnea, unspecified: Secondary | ICD-10-CM | POA: Insufficient documentation

## 2014-04-09 DIAGNOSIS — F32A Depression, unspecified: Secondary | ICD-10-CM

## 2014-04-09 DIAGNOSIS — Z7982 Long term (current) use of aspirin: Secondary | ICD-10-CM | POA: Insufficient documentation

## 2014-04-09 DIAGNOSIS — IMO0002 Reserved for concepts with insufficient information to code with codable children: Secondary | ICD-10-CM | POA: Insufficient documentation

## 2014-04-09 DIAGNOSIS — S92353A Displaced fracture of fifth metatarsal bone, unspecified foot, initial encounter for closed fracture: Secondary | ICD-10-CM

## 2014-04-09 DIAGNOSIS — I10 Essential (primary) hypertension: Secondary | ICD-10-CM | POA: Insufficient documentation

## 2014-04-09 DIAGNOSIS — D649 Anemia, unspecified: Secondary | ICD-10-CM

## 2014-04-09 DIAGNOSIS — S92309A Fracture of unspecified metatarsal bone(s), unspecified foot, initial encounter for closed fracture: Secondary | ICD-10-CM | POA: Diagnosis not present

## 2014-04-09 DIAGNOSIS — F325 Major depressive disorder, single episode, in full remission: Secondary | ICD-10-CM | POA: Insufficient documentation

## 2014-04-09 DIAGNOSIS — I5032 Chronic diastolic (congestive) heart failure: Secondary | ICD-10-CM | POA: Insufficient documentation

## 2014-04-09 DIAGNOSIS — K648 Other hemorrhoids: Secondary | ICD-10-CM

## 2014-04-09 DIAGNOSIS — E785 Hyperlipidemia, unspecified: Secondary | ICD-10-CM | POA: Insufficient documentation

## 2014-04-09 DIAGNOSIS — Z79899 Other long term (current) drug therapy: Secondary | ICD-10-CM | POA: Insufficient documentation

## 2014-04-09 DIAGNOSIS — J4489 Other specified chronic obstructive pulmonary disease: Secondary | ICD-10-CM | POA: Insufficient documentation

## 2014-04-09 DIAGNOSIS — I152 Hypertension secondary to endocrine disorders: Secondary | ICD-10-CM | POA: Diagnosis present

## 2014-04-09 HISTORY — DX: Major depressive disorder, single episode, unspecified: F32.9

## 2014-04-09 HISTORY — DX: Depression, unspecified: F32.A

## 2014-04-09 HISTORY — PX: METATARSAL OSTEOTOMY WITH OPEN REDUCTION INTERNAL FIXATION (ORIF) METATARSAL WITH FUSION: SHX5692

## 2014-04-09 HISTORY — DX: Chronic obstructive pulmonary disease, unspecified: J44.9

## 2014-04-09 LAB — GLUCOSE, CAPILLARY
Glucose-Capillary: 187 mg/dL — ABNORMAL HIGH (ref 70–99)
Glucose-Capillary: 149 mg/dL — ABNORMAL HIGH (ref 70–99)

## 2014-04-09 SURGERY — METATARSAL OSTEOTOMY WITH OPEN REDUCTION INTERNAL FIXATION (ORIF) METATARSAL WITH FUSION
Anesthesia: Monitor Anesthesia Care | Site: Foot | Laterality: Left

## 2014-04-09 MED ORDER — OXYCODONE HCL 5 MG/5ML PO SOLN
5.0000 mg | Freq: Once | ORAL | Status: DC | PRN
Start: 2014-04-09 — End: 2014-04-09

## 2014-04-09 MED ORDER — CHLORHEXIDINE GLUCONATE 4 % EX LIQD: 60.0000 mL | Freq: Once | CUTANEOUS | Status: DC

## 2014-04-09 MED ORDER — OXYCODONE HCL 5 MG PO TABS: 5.0000 mg | ORAL_TABLET | Freq: Once | ORAL | Status: DC | PRN

## 2014-04-09 MED ORDER — FENTANYL CITRATE 0.05 MG/ML IJ SOLN
INTRAMUSCULAR | Status: AC
  Filled 2014-04-09: qty 2

## 2014-04-09 MED ORDER — LIDOCAINE HCL (CARDIAC) 20 MG/ML IV SOLN
INTRAVENOUS | Status: DC | PRN
  Administered 2014-04-09: 50 mg via INTRAVENOUS

## 2014-04-09 MED ORDER — OXYCODONE HCL 5 MG PO TABS: ORAL_TABLET | ORAL | Status: DC

## 2014-04-09 MED ORDER — PROPOFOL INFUSION 10 MG/ML OPTIME
INTRAVENOUS | Status: DC | PRN
  Administered 2014-04-09: 100 ug/kg/min via INTRAVENOUS

## 2014-04-09 MED ORDER — LACTATED RINGERS IV SOLN
INTRAVENOUS | Status: DC
  Administered 2014-04-09: 12:00:00 via INTRAVENOUS

## 2014-04-09 MED ORDER — CEFAZOLIN SODIUM-DEXTROSE 2-3 GM-% IV SOLR
INTRAVENOUS | Status: AC
  Filled 2014-04-09: qty 50

## 2014-04-09 MED ORDER — CHOLECALCIFEROL 125 MCG (5000 UT) PO TABS: 5000.0000 [IU] | ORAL_TABLET | Freq: Every day | ORAL | Status: DC

## 2014-04-09 MED ORDER — CEFAZOLIN SODIUM-DEXTROSE 2-3 GM-% IV SOLR
2.0000 g | INTRAVENOUS | Status: AC
  Administered 2014-04-09: 2 g via INTRAVENOUS

## 2014-04-09 MED ORDER — MIDAZOLAM HCL 2 MG/2ML IJ SOLN
1.0000 mg | INTRAMUSCULAR | Status: DC | PRN
  Administered 2014-04-09: 2 mg via INTRAVENOUS

## 2014-04-09 MED ORDER — FENTANYL CITRATE 0.05 MG/ML IJ SOLN
50.0000 ug | INTRAMUSCULAR | Status: DC | PRN
  Administered 2014-04-09: 100 ug via INTRAVENOUS

## 2014-04-09 MED ORDER — MIDAZOLAM HCL 2 MG/2ML IJ SOLN
INTRAMUSCULAR | Status: AC
  Filled 2014-04-09: qty 2

## 2014-04-09 MED ORDER — ONDANSETRON HCL 4 MG/2ML IJ SOLN
INTRAMUSCULAR | Status: DC | PRN
  Administered 2014-04-09: 4 mg via INTRAVENOUS

## 2014-04-09 MED ORDER — BUPIVACAINE-EPINEPHRINE (PF) 0.5% -1:200000 IJ SOLN
INTRAMUSCULAR | Status: DC | PRN
  Administered 2014-04-09: 30 mL via PERINEURAL

## 2014-04-09 MED ORDER — BUPIVACAINE HCL (PF) 0.25 % IJ SOLN
INTRAMUSCULAR | Status: AC
  Filled 2014-04-09: qty 30

## 2014-04-09 MED ORDER — HYDROMORPHONE HCL PF 1 MG/ML IJ SOLN: 0.2500 mg | INTRAMUSCULAR | Status: DC | PRN

## 2014-04-09 MED ORDER — BUPIVACAINE HCL (PF) 0.5 % IJ SOLN
INTRAMUSCULAR | Status: DC | PRN
  Administered 2014-04-09: 10 mL via PERINEURAL

## 2014-04-09 SURGICAL SUPPLY — 78 items
ADH SKN CLS APL DERMABOND .7 (GAUZE/BANDAGES/DRESSINGS)
APL SKNCLS STERI-STRIP NONHPOA (GAUZE/BANDAGES/DRESSINGS)
BAG DECANTER FOR FLEXI CONT (MISCELLANEOUS) IMPLANT
BANDAGE ELASTIC 4 VELCRO ST LF (GAUZE/BANDAGES/DRESSINGS) IMPLANT
BANDAGE ELASTIC 6 VELCRO ST LF (GAUZE/BANDAGES/DRESSINGS) IMPLANT
BANDAGE ESMARK 6X9 LF (GAUZE/BANDAGES/DRESSINGS) IMPLANT
BENZOIN TINCTURE PRP APPL 2/3 (GAUZE/BANDAGES/DRESSINGS) IMPLANT
BLADE SURG 15 STRL LF DISP TIS (BLADE) ×1 IMPLANT
BLADE SURG 15 STRL SS (BLADE) ×2
BNDG CMPR 9X4 STRL LF SNTH (GAUZE/BANDAGES/DRESSINGS)
BNDG CMPR 9X6 STRL LF SNTH (GAUZE/BANDAGES/DRESSINGS)
BNDG COHESIVE 3X5 TAN STRL LF (GAUZE/BANDAGES/DRESSINGS) ×2 IMPLANT
BNDG ESMARK 4X9 LF (GAUZE/BANDAGES/DRESSINGS) IMPLANT
BNDG ESMARK 6X9 LF (GAUZE/BANDAGES/DRESSINGS)
CANISTER SUCT 1200ML W/VALVE (MISCELLANEOUS) IMPLANT
COVER TABLE BACK 60X90 (DRAPES) ×2 IMPLANT
CUFF TOURNIQUET SINGLE 18IN (TOURNIQUET CUFF) IMPLANT
CUFF TOURNIQUET SINGLE 34IN LL (TOURNIQUET CUFF) IMPLANT
DECANTER SPIKE VIAL GLASS SM (MISCELLANEOUS) IMPLANT
DERMABOND ADVANCED (GAUZE/BANDAGES/DRESSINGS)
DERMABOND ADVANCED .7 DNX12 (GAUZE/BANDAGES/DRESSINGS) IMPLANT
DRAPE EXTREMITY T 121X128X90 (DRAPE) ×2 IMPLANT
DRAPE OEC MINIVIEW 54X84 (DRAPES) ×2 IMPLANT
DRAPE U 20/CS (DRAPES) ×2 IMPLANT
DRAPE U-SHAPE 47X51 STRL (DRAPES) ×2 IMPLANT
DRILL BIT CANNULATED 3.2 (BIT) ×2 IMPLANT
DRSG TEGADERM 4X4.75 (GAUZE/BANDAGES/DRESSINGS) IMPLANT
DURAPREP 26ML APPLICATOR (WOUND CARE) ×2 IMPLANT
ELECT REM PT RETURN 9FT ADLT (ELECTROSURGICAL) ×2
ELECTRODE REM PT RTRN 9FT ADLT (ELECTROSURGICAL) ×1 IMPLANT
GAUZE SPONGE 4X4 12PLY STRL (GAUZE/BANDAGES/DRESSINGS) IMPLANT
GAUZE XEROFORM 1X8 LF (GAUZE/BANDAGES/DRESSINGS) ×2 IMPLANT
GLOVE BIO SURGEON STRL SZ7 (GLOVE) ×2 IMPLANT
GLOVE BIOGEL PI IND STRL 7.0 (GLOVE) ×1 IMPLANT
GLOVE BIOGEL PI IND STRL 7.5 (GLOVE) ×1 IMPLANT
GLOVE BIOGEL PI INDICATOR 7.0 (GLOVE) ×1
GLOVE BIOGEL PI INDICATOR 7.5 (GLOVE) ×1
GLOVE SS BIOGEL STRL SZ 7.5 (GLOVE) ×1 IMPLANT
GLOVE SUPERSENSE BIOGEL SZ 7.5 (GLOVE) ×1
GOWN STRL REUS W/ TWL LRG LVL3 (GOWN DISPOSABLE) ×3 IMPLANT
GOWN STRL REUS W/TWL LRG LVL3 (GOWN DISPOSABLE) ×6
IMPLANT MINI IGNITE KIT 4CC (Tissue) ×2 IMPLANT
K-WIRE 2.0 (WIRE) ×2
K-WIRE FX228X2XKRSH (WIRE) ×1
KWIRE FX228X2XKRSH (WIRE) ×1 IMPLANT
NEEDLE HYPO 22GX1.5 SAFETY (NEEDLE) IMPLANT
NS IRRIG 1000ML POUR BTL (IV SOLUTION) ×2 IMPLANT
PACK BASIN DAY SURGERY FS (CUSTOM PROCEDURE TRAY) ×2 IMPLANT
PAD CAST 4YDX4 CTTN HI CHSV (CAST SUPPLIES) IMPLANT
PADDING CAST ABS 4INX4YD NS (CAST SUPPLIES) ×1
PADDING CAST ABS COTTON 4X4 ST (CAST SUPPLIES) ×1 IMPLANT
PADDING CAST COTTON 4X4 STRL (CAST SUPPLIES)
PADDING CAST COTTON 6X4 STRL (CAST SUPPLIES) IMPLANT
PENCIL BUTTON HOLSTER BLD 10FT (ELECTRODE) IMPLANT
SCREW 4.5X50MM (Screw) ×2 IMPLANT
SHEET MEDIUM DRAPE 40X70 STRL (DRAPES) IMPLANT
SLEEVE SCD COMPRESS KNEE MED (MISCELLANEOUS) IMPLANT
SPLINT FAST PLASTER 5X30 (CAST SUPPLIES)
SPLINT PLASTER CAST FAST 5X30 (CAST SUPPLIES) IMPLANT
SPLINT PLASTER CAST XFAST 4X15 (CAST SUPPLIES) IMPLANT
SPLINT PLASTER XTRA FAST SET 4 (CAST SUPPLIES)
SPONGE GAUZE 4X4 12PLY STER LF (GAUZE/BANDAGES/DRESSINGS) IMPLANT
STOCKINETTE 4X48 STRL (DRAPES) IMPLANT
STOCKINETTE 6  STRL (DRAPES)
STOCKINETTE 6 STRL (DRAPES) IMPLANT
STRIP CLOSURE SKIN 1/2X4 (GAUZE/BANDAGES/DRESSINGS) IMPLANT
STRIP CLOSURE SKIN 1/4X4 (GAUZE/BANDAGES/DRESSINGS) IMPLANT
SUCTION FRAZIER TIP 10 FR DISP (SUCTIONS) IMPLANT
SUT ETHILON 4 0 PS 2 18 (SUTURE) IMPLANT
SUT PROLENE 3 0 PS 2 (SUTURE) IMPLANT
SUT VIC AB 2-0 SH 27 (SUTURE)
SUT VIC AB 2-0 SH 27XBRD (SUTURE) IMPLANT
SUT VIC AB 3-0 FS2 27 (SUTURE) IMPLANT
SYR BULB 3OZ (MISCELLANEOUS) IMPLANT
SYR CONTROL 10ML LL (SYRINGE) IMPLANT
TOWEL OR 17X24 6PK STRL BLUE (TOWEL DISPOSABLE) ×4 IMPLANT
TUBE CONNECTING 20X1/4 (TUBING) IMPLANT
UNDERPAD 30X30 INCONTINENT (UNDERPADS AND DIAPERS) ×2 IMPLANT

## 2014-04-09 NOTE — H&P (Signed)
[name] is an 53 y.o. female.   Chief Complaint: left 5th metatarsal non union HPI: [name] is a 53 year old seen for follow-up 4  months status post left foot 5th metatarsal Jones fracture with nonunion. She has been using a bone stimulator but despite this has persistent pain, she has been in a [employer] as well. Her primary care physician is Dr. Leanne Chang and she is a diabetic and has neuropathy.  Past Medical History  Diagnosis Date  . DIABETES MELLITUS, TYPE II 08/21/2006  . HYPERLIPIDEMIA 08/21/2006  . GOUT 08/20/2010  . OBESITY 06/04/2009  . ANEMIA-UNSPECIFIED 09/18/2009  . HYPERTENSION 08/21/2006  . GERD 08/21/2006  . SLEEP APNEA 04/21/2009    last testing was negative  . Internal hemorrhoids   . Pulmonary sarcoidosis     Followed locally by pulmonology, but also by Dr. Casper Harrison at [facility]  . Exertional chest pain     sharp, substernal, exertional  . Vocal cord dysfunction   . Chronic diastolic heart failure, NYHA class 2     LVEDP roughly 20% by cath  . CHF (congestive heart failure)   . COPD (chronic obstructive pulmonary disease)   . Depression   . Fracture of 5th metatarsal     non union    Past Surgical History  Procedure Laterality Date  . Ventral hernia repair    . Nissen fundoplication  2902  . Cholecystectomy  1984  . Abdominal hysterectomy    . Knee arthroscopy      right  . Tubal ligation      with reversal in 1994  . Bladder suspension  11/11/2011    Procedure: TRANSVAGINAL TAPE (TVT) PROCEDURE;  Surgeon: Olga Millers, MD;  Location: [facility];  Service: Gynecology;  Laterality: N/A;  . Cystoscopy  11/11/2011    Procedure: CYSTOSCOPY;  Surgeon: Olga Millers, MD;  Location: [facility];  Service: Gynecology;  Laterality: N/A;  . Doppler echocardiography  02/12/2013    LV FUNCTION, SIZE NORMAL; MILD CONCENTRIC LVH; EST EF 55-65%; WALL MOTION NORMAL  . Lexiscan myoview  03/09/2013    EF 50%; NORMAL MYOCARDIAL PERFUSION STUDY - breast  attenuation  . Cardiac catheterization  07/2010    LVEF 50-55% WITH VERY MILD GLOBAL HYPOKINESIA; ESSENTIALLY NORMAL CORONARY ARTERIES; NORMAL LV FUNCTION  . Carotids  02/18/11    CAROTID DUPLEX; VERTEBRALS ARE PATENT WITH ANTEGRADE FLOW. ICA/CCA RATIO 1.61 ON RIGHT AND 0.75 ON LEFT  . Right and left cardiac catheterization  04/23/2013    Angiographic normal coronaries; LVEDP 20 mmHg, PCWP 12-14 mmHg, RAP 12 mmHg.; Fick CO/CI 4.9/2.2    Family History  Problem Relation Age of Onset  . Diabetes Father   . Heart attack Father   . Coronary artery disease Father   . Heart failure Father   . COPD Mother   . Emphysema Mother   . Asthma Mother   . Heart failure Mother   . Sarcoidosis Maternal Uncle   . Colon cancer Neg Hx   . Lung cancer Brother   . Cancer Brother   . Diabetes Brother   . Heart attack Maternal Grandfather    Social History:  reports that she has never smoked. She has never used smokeless tobacco. She reports that she does not drink alcohol or use illicit drugs.  Allergies:  Allergies  Allergen Reactions  . Lisinopril Cough  . Methotrexate     REACTION: peri-oral and buccal lesions.  . Clindamycin/Lincomycin Nausea And Vomiting  and Rash   Outpatient Encounter Prescriptions as of 04/09/2014  Medication Sig  . albuterol (PROVENTIL HFA;VENTOLIN HFA) 108 (90 BASE) MCG/ACT inhaler Inhale 2 puffs into the lungs every 6 (six) hours as needed. For wheezing.  [name] albuterol (PROVENTIL) (5 MG/ML) 0.5% nebulizer solution Take 2.5 mg by nebulization every 6 (six) hours as needed. For wheezing.  [name] allopurinol (ZYLOPRIM) 100 MG tablet Take 100 mg by mouth at bedtime.   [name] aspirin EC 81 MG tablet Take 81 mg by mouth daily.   [name] atorvastatin (LIPITOR) 40 MG tablet Take one tablet by mouth one time daily  . B-D ULTRAFINE III SHORT PEN 31G X 8 MM MISC Follow directions provided by physician  . Blood Glucose Monitoring Suppl (FREESTYLE FREEDOM LITE) W/DEVICE KIT Use as adviced  .  buPROPion (WELLBUTRIN XL) 300 MG 24 hr tablet Take 300 mg by mouth daily.  . Canagliflozin 300 MG TABS Take 1 tablet by mouth daily. Take 300 mg by mouth daily in am.  . carvedilol (COREG) 25 MG tablet Take 1 tablet (25 mg total) by mouth 2 (two) times daily with a meal.  . chlorpheniramine-HYDROcodone (TUSSIONEX) 10-8 MG/5ML LQCR Take 5 mLs by mouth every 12 (twelve) hours as needed for cough.   . cholecalciferol (VITAMIN D) 1000 UNITS tablet Take 1,000 Units by mouth 2 (two) times daily.  [name] DEXILANT 60 MG capsule Take one capsule by mouth one time daily  . diltiazem (CARDIZEM CD) 120 MG 24 hr capsule Take 1 capsule (120 mg total) by mouth every evening.  [name] estradiol (ESTRACE) 2 MG tablet Take 2 mg by mouth at bedtime.   . fenofibrate (TRICOR) 145 MG tablet Take one tablet by mouth one time daily  . furosemide (LASIX) 40 MG tablet Take one tablet by mouth twice daily  . glucose blood (ONE TOUCH ULTRA TEST) test strip 1 each by Other route 3 (three) times daily. Use as instructed  . insulin aspart (NOVOLOG FLEXPEN) 100 UNIT/ML FlexPen Inject 7-12 Units into the skin 3 (three) times daily with meals. Pens please.  . insulin glargine (LANTUS) 100 UNIT/ML injection Inject 0.28 mLs (28 Units total) into the skin at bedtime. Pens please  . LORazepam (ATIVAN) 0.5 MG tablet Take 1 tablet (0.5 mg total) by mouth 2 (two) times daily as needed for anxiety.  [name] losartan (COZAAR) 100 MG tablet Take one tablet by mouth one time daily  . metFORMIN (GLUCOPHAGE) 1000 MG tablet Take 1 tablet (1,000 mg total) by mouth 2 (two) times daily with a meal.  . mometasone-formoterol (DULERA) 200-5 MCG/ACT AERO Inhale 2 puffs into the lungs 2 (two) times daily.  . nitroGLYCERIN (NITROSTAT) 0.4 MG SL tablet Place 0.4 mg under the tongue every 5 (five) minutes as needed. x3 doses as needed for chest pain.  [name] nystatin-triamcinolone ointment (MYCOLOG) Apply 1 application topically 2 (two) times daily.  [name] omeprazole (PRILOSEC) 20  MG capsule Take 1 capsule (20 mg total) by mouth 2 (two) times daily before a meal.  . PARoxetine Mesylate (BRISDELLE) 7.5 MG CAPS Take 7.5 mg by mouth daily.  . potassium chloride SA (K-DUR,KLOR-CON) 20 MEQ tablet TAKE ONE TABLET BY MOUTH THREE TIMES DAILY   . predniSONE (DELTASONE) 10 MG tablet Take 10 mg by mouth daily with breakfast.  . sucralfate (CARAFATE) 1 GM/10ML suspension Take 10 mLs (1 g total) by mouth 4 (four) times daily -  with meals and at bedtime.  [name] venlafaxine XR (EFFEXOR-XR) 75 MG 24 hr capsule TAKE  ONE CAPSULE BY MOUTH TWICE DAILY      (Not in a hospital admission)  No results found for this or any previous visit (from the past 48 hour(s)). No results found.  Review of Systems  Constitutional: Negative.   HENT: Positive for congestion and hearing loss.   Eyes: Negative.   Respiratory: Positive for shortness of breath.   Cardiovascular: Negative.   Gastrointestinal: Negative.   Genitourinary: Negative.   Musculoskeletal: Positive for joint pain.       Left foot pain  Skin: Negative.   Neurological: Negative.   Psychiatric/Behavioral: Negative.     Blood pressure 133/84, pulse 76, height _0  (1.676 m), weight 115.667 kg (255 lb). Physical Exam  Constitutional: She is oriented to person, place, and time. She appears well-developed and well-nourished.  HENT:  Head: Normocephalic and atraumatic.  Eyes: Conjunctivae are normal. Pupils are equal, round, and reactive to light.  Neck: Neck supple.  Cardiovascular: Normal rate.   Respiratory: Effort normal.  GI: Soft.  Genitourinary:  Not pertinent to current symptomatology therefore not examined.  Musculoskeletal:  Examination of her left foot reveals pain at the base of the 5th metatarsal minimal swelling, no deformity. Distal neurologic function shows decreased sensation in a stocking distribution. Vascular exam: pulses 1+ and symmetric.  Neurological: She is alert and oriented to person, place, and time.   Skin: Skin is warm and dry.  Psychiatric: She has a normal mood and affect.     Assessment Patient Active Problem List   Diagnosis Date Noted  . Internal hemorrhoids   . Pulmonary sarcoidosis   . Exertional chest pain   . CHF (congestive heart failure)   . COPD (chronic obstructive pulmonary disease)   . Depression   . Fracture of 5th metatarsal   . Preoperative clearance 03/25/2014  . COPD with acute exacerbation 03/14/2014  . Vocal cord dysfunction 12/03/2013  . COPD exacerbation 12/02/2013  . Obstructive chronic bronchitis without exacerbation 09/18/2013  . Other and unspecified coagulation defects 04/22/2013  . Chest pain - at rest, and with exertion; -- angiographically normal coronary arteries, mildly elevated LVEDP 04/11/2013  . Chronic diastolic heart failure, NYHA class 2   . Solitary pulmonary nodule, on CT 02/2013 - recs to repeat imaging in 6-12 months 02/20/2013  . Chronic cough 12/30/2011  . GOUT 08/20/2010  . ANEMIA-UNSPECIFIED 09/18/2009  . OBESITY 06/04/2009  . SLEEP APNEA 04/21/2009  . Sarcoidosis of lung 04/10/2007  . HYPERLIPIDEMIA 08/21/2006  . HYPERTENSION 08/21/2006  . GERD 08/21/2006  . Type II diabetes mellitus with neurological manifestations, uncontrolled 08/21/2006  . DIABETES MELLITUS, TYPE II 08/21/2006   Plan I talk to her about this in detail. I recommend with these findings and her significant persistent pain that we proceed with open reduction internal fixation of this with bone grafting. Discussed risks benefits and possible complications of the surgery in detail and she understands this completely.   SHEPPERSON,KIRSTIN J 04/09/2014, 10:44 AM

## 2014-04-09 NOTE — H&P (View-Only) (Signed)
[name] is an 53 y.o. female.   Chief Complaint: left 5th metatarsal non union HPI: [name] is a 53 year old seen for follow-up 4  months status post left foot 5th metatarsal Jones fracture with nonunion. She has been using a bone stimulator but despite this has persistent pain, she has been in a [employer] as well. Her primary care physician is Dr. Leanne Chang and she is a diabetic and has neuropathy.  Past Medical History  Diagnosis Date  . DIABETES MELLITUS, TYPE II 08/21/2006  . HYPERLIPIDEMIA 08/21/2006  . GOUT 08/20/2010  . OBESITY 06/04/2009  . ANEMIA-UNSPECIFIED 09/18/2009  . HYPERTENSION 08/21/2006  . GERD 08/21/2006  . SLEEP APNEA 04/21/2009    last testing was negative  . Internal hemorrhoids   . Pulmonary sarcoidosis     Followed locally by pulmonology, but also by Dr. Casper Harrison at [facility]  . Exertional chest pain     sharp, substernal, exertional  . Vocal cord dysfunction   . Chronic diastolic heart failure, NYHA class 2     LVEDP roughly 20% by cath  . CHF (congestive heart failure)   . COPD (chronic obstructive pulmonary disease)   . Depression   . Fracture of 5th metatarsal     non union    Past Surgical History  Procedure Laterality Date  . Ventral hernia repair    . Nissen fundoplication  2902  . Cholecystectomy  1984  . Abdominal hysterectomy    . Knee arthroscopy      right  . Tubal ligation      with reversal in 1994  . Bladder suspension  11/11/2011    Procedure: TRANSVAGINAL TAPE (TVT) PROCEDURE;  Surgeon: Olga Millers, MD;  Location: [facility];  Service: Gynecology;  Laterality: N/A;  . Cystoscopy  11/11/2011    Procedure: CYSTOSCOPY;  Surgeon: Olga Millers, MD;  Location: [facility];  Service: Gynecology;  Laterality: N/A;  . Doppler echocardiography  02/12/2013    LV FUNCTION, SIZE NORMAL; MILD CONCENTRIC LVH; EST EF 55-65%; WALL MOTION NORMAL  . Lexiscan myoview  03/09/2013    EF 50%; NORMAL MYOCARDIAL PERFUSION STUDY - breast  attenuation  . Cardiac catheterization  07/2010    LVEF 50-55% WITH VERY MILD GLOBAL HYPOKINESIA; ESSENTIALLY NORMAL CORONARY ARTERIES; NORMAL LV FUNCTION  . Carotids  02/18/11    CAROTID DUPLEX; VERTEBRALS ARE PATENT WITH ANTEGRADE FLOW. ICA/CCA RATIO 1.61 ON RIGHT AND 0.75 ON LEFT  . Right and left cardiac catheterization  04/23/2013    Angiographic normal coronaries; LVEDP 20 mmHg, PCWP 12-14 mmHg, RAP 12 mmHg.; Fick CO/CI 4.9/2.2    Family History  Problem Relation Age of Onset  . Diabetes Father   . Heart attack Father   . Coronary artery disease Father   . Heart failure Father   . COPD Mother   . Emphysema Mother   . Asthma Mother   . Heart failure Mother   . Sarcoidosis Maternal Uncle   . Colon cancer Neg Hx   . Lung cancer Brother   . Cancer Brother   . Diabetes Brother   . Heart attack Maternal Grandfather    Social History:  reports that she has never smoked. She has never used smokeless tobacco. She reports that she does not drink alcohol or use illicit drugs.  Allergies:  Allergies  Allergen Reactions  . Lisinopril Cough  . Methotrexate     REACTION: peri-oral and buccal lesions.  . Clindamycin/Lincomycin Nausea And Vomiting  and Rash   Outpatient Encounter Prescriptions as of 04/09/2014  Medication Sig  . albuterol (PROVENTIL HFA;VENTOLIN HFA) 108 (90 BASE) MCG/ACT inhaler Inhale 2 puffs into the lungs every 6 (six) hours as needed. For wheezing.  [name] albuterol (PROVENTIL) (5 MG/ML) 0.5% nebulizer solution Take 2.5 mg by nebulization every 6 (six) hours as needed. For wheezing.  [name] allopurinol (ZYLOPRIM) 100 MG tablet Take 100 mg by mouth at bedtime.   [name] aspirin EC 81 MG tablet Take 81 mg by mouth daily.   [name] atorvastatin (LIPITOR) 40 MG tablet Take one tablet by mouth one time daily  . B-D ULTRAFINE III SHORT PEN 31G X 8 MM MISC Follow directions provided by physician  . Blood Glucose Monitoring Suppl (FREESTYLE FREEDOM LITE) W/DEVICE KIT Use as adviced  .  buPROPion (WELLBUTRIN XL) 300 MG 24 hr tablet Take 300 mg by mouth daily.  . Canagliflozin 300 MG TABS Take 1 tablet by mouth daily. Take 300 mg by mouth daily in am.  . carvedilol (COREG) 25 MG tablet Take 1 tablet (25 mg total) by mouth 2 (two) times daily with a meal.  . chlorpheniramine-HYDROcodone (TUSSIONEX) 10-8 MG/5ML LQCR Take 5 mLs by mouth every 12 (twelve) hours as needed for cough.   . cholecalciferol (VITAMIN D) 1000 UNITS tablet Take 1,000 Units by mouth 2 (two) times daily.  [name] DEXILANT 60 MG capsule Take one capsule by mouth one time daily  . diltiazem (CARDIZEM CD) 120 MG 24 hr capsule Take 1 capsule (120 mg total) by mouth every evening.  [name] estradiol (ESTRACE) 2 MG tablet Take 2 mg by mouth at bedtime.   . fenofibrate (TRICOR) 145 MG tablet Take one tablet by mouth one time daily  . furosemide (LASIX) 40 MG tablet Take one tablet by mouth twice daily  . glucose blood (ONE TOUCH ULTRA TEST) test strip 1 each by Other route 3 (three) times daily. Use as instructed  . insulin aspart (NOVOLOG FLEXPEN) 100 UNIT/ML FlexPen Inject 7-12 Units into the skin 3 (three) times daily with meals. Pens please.  . insulin glargine (LANTUS) 100 UNIT/ML injection Inject 0.28 mLs (28 Units total) into the skin at bedtime. Pens please  . LORazepam (ATIVAN) 0.5 MG tablet Take 1 tablet (0.5 mg total) by mouth 2 (two) times daily as needed for anxiety.  [name] losartan (COZAAR) 100 MG tablet Take one tablet by mouth one time daily  . metFORMIN (GLUCOPHAGE) 1000 MG tablet Take 1 tablet (1,000 mg total) by mouth 2 (two) times daily with a meal.  . mometasone-formoterol (DULERA) 200-5 MCG/ACT AERO Inhale 2 puffs into the lungs 2 (two) times daily.  . nitroGLYCERIN (NITROSTAT) 0.4 MG SL tablet Place 0.4 mg under the tongue every 5 (five) minutes as needed. x3 doses as needed for chest pain.  [name] nystatin-triamcinolone ointment (MYCOLOG) Apply 1 application topically 2 (two) times daily.  [name] omeprazole (PRILOSEC) 20  MG capsule Take 1 capsule (20 mg total) by mouth 2 (two) times daily before a meal.  . PARoxetine Mesylate (BRISDELLE) 7.5 MG CAPS Take 7.5 mg by mouth daily.  . potassium chloride SA (K-DUR,KLOR-CON) 20 MEQ tablet TAKE ONE TABLET BY MOUTH THREE TIMES DAILY   . predniSONE (DELTASONE) 10 MG tablet Take 10 mg by mouth daily with breakfast.  . sucralfate (CARAFATE) 1 GM/10ML suspension Take 10 mLs (1 g total) by mouth 4 (four) times daily -  with meals and at bedtime.  [name] venlafaxine XR (EFFEXOR-XR) 75 MG 24 hr capsule TAKE  ONE CAPSULE BY MOUTH TWICE DAILY      (Not in a hospital admission)  No results found for this or any previous visit (from the past 48 hour(s)). No results found.  Review of Systems  Constitutional: Negative.   HENT: Positive for congestion and hearing loss.   Eyes: Negative.   Respiratory: Positive for shortness of breath.   Cardiovascular: Negative.   Gastrointestinal: Negative.   Genitourinary: Negative.   Musculoskeletal: Positive for joint pain.       Left foot pain  Skin: Negative.   Neurological: Negative.   Psychiatric/Behavioral: Negative.     Blood pressure 133/84, pulse 76, height _0  (1.676 m), weight 115.667 kg (255 lb). Physical Exam  Constitutional: She is oriented to person, place, and time. She appears well-developed and well-nourished.  HENT:  Head: Normocephalic and atraumatic.  Eyes: Conjunctivae are normal. Pupils are equal, round, and reactive to light.  Neck: Neck supple.  Cardiovascular: Normal rate.   Respiratory: Effort normal.  GI: Soft.  Genitourinary:  Not pertinent to current symptomatology therefore not examined.  Musculoskeletal:  Examination of her left foot reveals pain at the base of the 5th metatarsal minimal swelling, no deformity. Distal neurologic function shows decreased sensation in a stocking distribution. Vascular exam: pulses 1+ and symmetric.  Neurological: She is alert and oriented to person, place, and time.   Skin: Skin is warm and dry.  Psychiatric: She has a normal mood and affect.     Assessment Patient Active Problem List   Diagnosis Date Noted  . Internal hemorrhoids   . Pulmonary sarcoidosis   . Exertional chest pain   . CHF (congestive heart failure)   . COPD (chronic obstructive pulmonary disease)   . Depression   . Fracture of 5th metatarsal   . Preoperative clearance 03/25/2014  . COPD with acute exacerbation 03/14/2014  . Vocal cord dysfunction 12/03/2013  . COPD exacerbation 12/02/2013  . Obstructive chronic bronchitis without exacerbation 09/18/2013  . Other and unspecified coagulation defects 04/22/2013  . Chest pain - at rest, and with exertion; -- angiographically normal coronary arteries, mildly elevated LVEDP 04/11/2013  . Chronic diastolic heart failure, NYHA class 2   . Solitary pulmonary nodule, on CT 02/2013 - recs to repeat imaging in 6-12 months 02/20/2013  . Chronic cough 12/30/2011  . GOUT 08/20/2010  . ANEMIA-UNSPECIFIED 09/18/2009  . OBESITY 06/04/2009  . SLEEP APNEA 04/21/2009  . Sarcoidosis of lung 04/10/2007  . HYPERLIPIDEMIA 08/21/2006  . HYPERTENSION 08/21/2006  . GERD 08/21/2006  . Type II diabetes mellitus with neurological manifestations, uncontrolled 08/21/2006  . DIABETES MELLITUS, TYPE II 08/21/2006   Plan I talk to her about this in detail. I recommend with these findings and her significant persistent pain that we proceed with open reduction internal fixation of this with bone grafting. Discussed risks benefits and possible complications of the surgery in detail and she understands this completely.   SHEPPERSON,KIRSTIN J 04/09/2014, 10:44 AM

## 2014-04-09 NOTE — Anesthesia Postprocedure Evaluation (Signed)
  Anesthesia Post-op Note  Patient: [name]  Procedure(s) Performed: Procedure(s): LEFT FOOT FRACTURE OPEN TREATMENT METATARSAL INCLUDES INTERNAL FIXATION EACH (Left)  Patient Location: PACU  Anesthesia Type: MAC and Popliteal block  Level of Consciousness: awake and alert   Airway and Oxygen Therapy: Patient Spontanous Breathing  Post-op Pain: none  Post-op Assessment: Post-op Vital signs reviewed, Patient's Cardiovascular Status Stable and Respiratory Function Stable  Post-op Vital Signs: Reviewed  Filed Vitals:   04/09/14 1345  BP: 129/64  Pulse: 73  Temp:   Resp: 20    Complications: No apparent anesthesia complications

## 2014-04-09 NOTE — Anesthesia Procedure Notes (Addendum)
Anesthesia Regional Block:  Popliteal block  Pre-Anesthetic Checklist: ,, timeout performed, Correct Patient, Correct Site, Correct Laterality, Correct Procedure, Correct Position, site marked, Risks and benefits discussed, pre-op evaluation, post-op pain management  Laterality: Left  Prep: Maximum Sterile Barrier Precautions used and chloraprep       Needles:  Injection technique: Single-shot  Needle Type: Echogenic Stimulator Needle     Needle Length: 9cm 9 cm Needle Gauge: 21 and 21 G    Additional Needles:  Procedures: ultrasound guided (picture in chart) and nerve stimulator Popliteal block  Nerve Stimulator or Paresthesia:  Response: Peroneal,  Response: Tibial,   Additional Responses:   Narrative:  Start time: 04/09/2014 11:40 AM End time: 04/09/2014 11:47 AM Injection made incrementally with aspirations every 5 mL. Anesthesiologist: Ola Spurr, MD  Additional Notes: 2% Lidocaine skin wheel.    Procedure Name: MAC Date/Time: 04/09/2014 12:19 PM Performed by: Lieutenant Diego Pre-anesthesia Checklist: Patient identified, Emergency Drugs available, Suction available, Patient being monitored and Timeout performed Patient Re-evaluated:Patient Re-evaluated prior to inductionOxygen Delivery Method: Simple face mask Preoxygenation: Pre-oxygenation with 100% oxygen Intubation Type: IV induction Placement Confirmation: breath sounds checked- equal and bilateral and positive ETCO2

## 2014-04-09 NOTE — Transfer of Care (Signed)
Immediate Anesthesia Transfer of Care Note  Patient: [name]  Procedure(s) Performed: Procedure(s): LEFT FOOT FRACTURE OPEN TREATMENT METATARSAL INCLUDES INTERNAL FIXATION EACH (Left)  Patient Location: PACU  Anesthesia Type:MAC  Level of Consciousness: awake and alert   Airway & Oxygen Therapy: Patient Spontanous Breathing and Patient connected to face mask oxygen  Post-op Assessment: Report given to PACU RN and Post -op Vital signs reviewed and stable  Post vital signs: Reviewed and stable  Complications: No apparent anesthesia complications

## 2014-04-09 NOTE — Discharge Instructions (Signed)
Regional Anesthesia Blocks ° °1. Numbness or the inability to move the "blocked" extremity may last from 3-48 hours after placement. The length of time depends on the medication injected and your individual response to the medication. If the numbness is not going away after 48 hours, call your surgeon. ° °2. The extremity that is blocked will need to be protected until the numbness is gone and the  Strength has returned. Because you cannot feel it, you will need to take extra care to avoid injury. Because it may be weak, you may have difficulty moving it or using it. You may not know what position it is in without looking at it while the block is in effect. ° °3. For blocks in the legs and feet, returning to weight bearing and walking needs to be done carefully. You will need to wait until the numbness is entirely gone and the strength has returned. You should be able to move your leg and foot normally before you try and bear weight or walk. You will need someone to be with you when you first try to ensure you do not fall and possibly risk injury. ° °4. Bruising and tenderness at the needle site are common side effects and will resolve in a few days. ° °5. Persistent numbness or new problems with movement should be communicated to the surgeon or the [facility] ([phone])/ [facility] ([phone]). ° ° ° °Post Anesthesia Home Care Instructions ° °Activity: °Get plenty of rest for the remainder of the day. A responsible adult should stay with you for 24 hours following the procedure.  °For the next 24 hours, DO NOT: °-Drive a car °-Operate machinery °-Drink alcoholic beverages °-Take any medication unless instructed by your physician °-Make any legal decisions or sign important papers. ° °Meals: °Start with liquid foods such as gelatin or soup. Progress to regular foods as tolerated. Avoid greasy, spicy, heavy foods. If nausea and/or vomiting occur, drink only clear liquids until the  nausea and/or vomiting subsides. Call your physician if vomiting continues. ° °Special Instructions/Symptoms: °Your throat may feel dry or sore from the anesthesia or the breathing tube placed in your throat during surgery. If this causes discomfort, gargle with warm salt water. The discomfort should disappear within 24 hours. ° °

## 2014-04-09 NOTE — Progress Notes (Signed)
Assisted Dr. Ola Spurr with left, ultrasound guided, popliteal block. Side rails up, monitors on throughout procedure. See vital signs in flow sheet. Tolerated Procedure well.

## 2014-04-09 NOTE — Interval H&P Note (Signed)
History and Physical Interval Note:  04/09/2014 12:10 PM  [name]  has presented today for surgery, with the diagnosis of LEFT FOOT MALUNION AND NONUNION OF FRACTURE  The various methods of treatment have been discussed with the patient and family. After consideration of risks, benefits and other options for treatment, the patient has consented to  Procedure(s): LEFT FOOT FRACTURE OPEN TREATMENT METATARSAL [place] (Left) as a surgical intervention .  The patient's history has been reviewed, patient examined, no change in status, stable for surgery.  I have reviewed the patient's chart and labs.  Questions were answered to the patient's satisfaction.     [name] A

## 2014-04-09 NOTE — Anesthesia Preprocedure Evaluation (Signed)
Anesthesia Evaluation  Patient identified by MRN, date of birth, ID band Patient awake    Reviewed: Allergy & Precautions, H&P , NPO status , Patient's Chart, lab work & pertinent test results, reviewed documented beta blocker date and time   Airway Mallampati: II TM Distance: >3 FB Neck ROM: Full    Dental no notable dental hx. (+) Upper Dentures, Lower Dentures, Dental Advisory Given   Pulmonary sleep apnea , COPD COPD inhaler,  breath sounds clear to auscultation  Pulmonary exam normal       Cardiovascular hypertension, On Medications and On Home Beta Blockers +CHF Rhythm:Regular Rate:Normal     Neuro/Psych negative neurological ROS  negative psych ROS   GI/Hepatic Neg liver ROS, GERD-  Medicated and Controlled,  Endo/Other  diabetes, Type 1, Insulin DependentMorbid obesity  Renal/GU negative Renal ROS  negative genitourinary   Musculoskeletal   Abdominal   Peds  Hematology  (+) anemia ,   Anesthesia Other Findings   Reproductive/Obstetrics negative OB ROS                           Anesthesia Physical Anesthesia Plan  ASA: III  Anesthesia Plan: MAC   Post-op Pain Management:    Induction: Intravenous  Airway Management Planned: Simple Face Mask  Additional Equipment:   Intra-op Plan:   Post-operative Plan: Extubation in OR  Informed Consent: I have reviewed the patients History and Physical, chart, labs and discussed the procedure including the risks, benefits and alternatives for the proposed anesthesia with the patient or authorized representative who has indicated his/her understanding and acceptance.   Dental advisory given  Plan Discussed with: CRNA and Surgeon  Anesthesia Plan Comments:         Anesthesia Quick Evaluation

## 2014-04-10 ENCOUNTER — Encounter (HOSPITAL_BASED_OUTPATIENT_CLINIC_OR_DEPARTMENT_OTHER): Payer: Self-pay | Admitting: Orthopedic Surgery

## 2014-04-10 NOTE — Op Note (Signed)
[name], [name]              ACCOUNT NO.:  [id number]  MEDICAL RECORD NO.:  [id number]  LOCATION:                                FACILITY:  [facility]  PHYSICIAN:  Zarriah Starkel A. Noemi Chapel, M.D. DATE OF BIRTH:  1961/09/04  DATE OF PROCEDURE:  04/09/2014 DATE OF DISCHARGE:  04/09/2014                              OPERATIVE REPORT   PREOPERATIVE DIAGNOSIS:  Left foot fifth metatarsal nonunion.  POSTOP DIAGNOSIS:  Left foot fifth metatarsal nonunion.  PROCEDURE:  Open reduction and internal fixation of the left foot fifth metatarsal nonunion with 4.5 x 50 mm screw with calcaneal and cancellous/Ignight graft.  SURGEON:  Audree Camel. Noemi Chapel, M.D.  ASSISTANT:  Matthew Saras, PA-C  ANESTHESIA:  Regional anesthesia.  OPERATIVE TIME:  1 hour.  COMPLICATIONS:  None.  INDICATION FOR PROCEDURE:  [name] is a 53 year old woman who has had 5 months of left fifth metatarsal fracture nonunion.  Exam and x- rays have confirmed this, and she is now to undergo ORIF of this using cancellous calcaneal and Ignight graft.  DESCRIPTION OF PROCEDURE:  [name] was brought to the operating room on April 09, 2014, after a popliteal block was placed in the holding room by anesthesia.  She was placed on operative table in supine position.  She received antibiotics preoperatively for prophylaxis. After being placed on the hospital bed, her left foot and leg was prepped using sterile DuraPrep and draped using sterile technique.  Time- out procedure was called, the correct left foot identified.  Left foot and leg was exsanguinated and a calf tourniquet elevated 275 mm. Initially, through a 1 cm lateral incision proximal to the base of the fifth metatarsal under fluoroscopic control, the guidepin was placed at the base of the fifth metatarsal and then drilled distally intramedullary across the fracture into the distal fifth metatarsal shaft under fluoroscopic control.  It was then overdrilled with a  3.2 mm drill and then a 4.5 mm tap was used to tap this tunnel and found to be excellent size as well and it was measured and 50 mm was found to be the appropriate length.  At this point, then a 2 cm longitudinal incision was made over the nonunion site.  Subcutaneous tissues were incised along with skin incision.  The nonunion was exposed and granulation tissue was removed from the nonunion site and irrigated.  After this was done, then a small 7 mm incision was made over the lateral calcaneus under fluoroscopic control for using the [facility] medical instruments to procure aspirate and cancellous bone from the calcaneus, this was done without complication and prepared on the back table and then this cancellous and aspirate graft next with Ignight, material was mixed together and then placed into the nonunion site.  After this was done, then the 4.5 mm x 50 mm screw was screwed from the proximal fragment across the fracture site and screwed into the distal fifth metatarsal shaft with excellent fixation.  This also caused great compression across the fracture site with the graft in place, thus reducing the fracture and reduced in anatomic and firmly held position.  This was confirmed by intraoperative fluoroscopy.  After this was done,  the wounds were closed with interrupted 4-0 nylon suture.  Sterile dressings were applied and a short-leg splint, and the patient awakened and taken to recovery in stable condition.  Needle and sponge counts correct x2 at the end the case.  FOLLOWUP CARE:  [name] to be followed as an outpatient, on Norco and Valium.  Seen back in my office in a week for wound check and followup.     Jaunice Mirza A. Noemi Chapel, M.D.     RAW/MEDQ  D:  04/09/2014  T:  04/10/2014  Job:  [id number]

## 2014-04-17 ENCOUNTER — Encounter: Payer: Self-pay | Admitting: Hematology and Oncology

## 2014-04-17 ENCOUNTER — Ambulatory Visit (HOSPITAL_BASED_OUTPATIENT_CLINIC_OR_DEPARTMENT_OTHER): Payer: BC Managed Care – PPO | Admitting: Hematology and Oncology

## 2014-04-17 ENCOUNTER — Telehealth: Payer: Self-pay | Admitting: Hematology and Oncology

## 2014-04-17 ENCOUNTER — Ambulatory Visit: Payer: BC Managed Care – PPO

## 2014-04-17 ENCOUNTER — Ambulatory Visit (HOSPITAL_BASED_OUTPATIENT_CLINIC_OR_DEPARTMENT_OTHER): Payer: BC Managed Care – PPO

## 2014-04-17 VITALS — BP 125/66 | HR 84 | Temp 97.2°F | Resp 19 | Ht 66.0 in | Wt 258.3 lb

## 2014-04-17 DIAGNOSIS — D72819 Decreased white blood cell count, unspecified: Secondary | ICD-10-CM

## 2014-04-17 DIAGNOSIS — R778 Other specified abnormalities of plasma proteins: Secondary | ICD-10-CM

## 2014-04-17 DIAGNOSIS — D72829 Elevated white blood cell count, unspecified: Secondary | ICD-10-CM | POA: Insufficient documentation

## 2014-04-17 DIAGNOSIS — D649 Anemia, unspecified: Secondary | ICD-10-CM

## 2014-04-17 DIAGNOSIS — R799 Abnormal finding of blood chemistry, unspecified: Secondary | ICD-10-CM

## 2014-04-17 DIAGNOSIS — D869 Sarcoidosis, unspecified: Secondary | ICD-10-CM | POA: Diagnosis not present

## 2014-04-17 DIAGNOSIS — E242 Drug-induced Cushing's syndrome: Secondary | ICD-10-CM | POA: Insufficient documentation

## 2014-04-17 DIAGNOSIS — S8292XG Unspecified fracture of left lower leg, subsequent encounter for closed fracture with delayed healing: Secondary | ICD-10-CM

## 2014-04-17 DIAGNOSIS — J99 Respiratory disorders in diseases classified elsewhere: Secondary | ICD-10-CM

## 2014-04-17 DIAGNOSIS — S8292XA Unspecified fracture of left lower leg, initial encounter for closed fracture: Secondary | ICD-10-CM | POA: Insufficient documentation

## 2014-04-17 DIAGNOSIS — E249 Cushing's syndrome, unspecified: Secondary | ICD-10-CM

## 2014-04-17 HISTORY — DX: Other specified abnormalities of plasma proteins: R77.8

## 2014-04-17 LAB — CBC WITH DIFFERENTIAL/PLATELET
BASO%: 0.2 % (ref 0.0–2.0)
Basophils Absolute: 0 10*3/uL (ref 0.0–0.1)
EOS%: 0.3 % (ref 0.0–7.0)
Eosinophils Absolute: 0.1 10*3/uL (ref 0.0–0.5)
HCT: 36.9 % (ref 34.8–46.6)
HGB: 12.1 g/dL (ref 11.6–15.9)
LYMPH%: 13.2 % — ABNORMAL LOW (ref 14.0–49.7)
MCH: 28.9 pg (ref 25.1–34.0)
MCHC: 32.9 g/dL (ref 31.5–36.0)
MCV: 87.9 fL (ref 79.5–101.0)
MONO#: 0.8 10*3/uL (ref 0.1–0.9)
MONO%: 4.5 % (ref 0.0–14.0)
NEUT#: 13.8 10*3/uL — ABNORMAL HIGH (ref 1.5–6.5)
NEUT%: 81.8 % — ABNORMAL HIGH (ref 38.4–76.8)
Platelets: 436 10*3/uL — ABNORMAL HIGH (ref 145–400)
RBC: 4.2 10*6/uL (ref 3.70–5.45)
RDW: 14.5 % (ref 11.2–14.5)
WBC: 16.9 10*3/uL — ABNORMAL HIGH (ref 3.9–10.3)
lymph#: 2.2 10*3/uL (ref 0.9–3.3)

## 2014-04-17 LAB — COMPREHENSIVE METABOLIC PANEL (CC13)
ALT: 20 U/L (ref 0–55)
AST: 8 U/L (ref 5–34)
Albumin: 3.7 g/dL (ref 3.5–5.0)
Alkaline Phosphatase: 82 U/L (ref 40–150)
Anion Gap: 11 mEq/L (ref 3–11)
BUN: 13.9 mg/dL (ref 7.0–26.0)
CO2: 26 mEq/L (ref 22–29)
Calcium: 10 mg/dL (ref 8.4–10.4)
Chloride: 99 mEq/L (ref 98–109)
Creatinine: 0.8 mg/dL (ref 0.6–1.1)
Glucose: 300 mg/dl — ABNORMAL HIGH (ref 70–140)
Potassium: 4.5 mEq/L (ref 3.5–5.1)
Sodium: 135 mEq/L — ABNORMAL LOW (ref 136–145)
Total Bilirubin: 0.45 mg/dL (ref 0.20–1.20)
Total Protein: 6.8 g/dL (ref 6.4–8.3)

## 2014-04-17 LAB — LACTATE DEHYDROGENASE (CC13): LDH: 144 U/L (ref 125–245)

## 2014-04-17 NOTE — Progress Notes (Signed)
[place] CONSULT NOTE  Patient Care Team: Lisabeth Pick, MD as PCP - General Elsie Stain, MD (Pulmonary Disease)  CHIEF COMPLAINTS/PURPOSE OF CONSULTATION:  Abnormal SPEP  HISTORY OF PRESENTING ILLNESS:  [name] 53 y.o. female is here because of abnormal blood work. SPEP dated 03/20/14 did not detect any M Spike but she has low IgG level and appeared to have increased alpha and beta globulins. She has chronic pulmonary sarcoidosis with biopsy proven disease and had been on prednisone for over 5 years. Recently, she had spontaneous left leg fracture not preceded with any known trauma.  Patient denies history of recurrent infection or atypical infections such as shingles of meningitis. Denies chills, night sweats, anorexia or abnormal weight loss.  MEDICAL HISTORY:  Past Medical History  Diagnosis Date  . DIABETES MELLITUS, TYPE II 08/21/2006  . HYPERLIPIDEMIA 08/21/2006  . GOUT 08/20/2010  . OBESITY 06/04/2009  . ANEMIA-UNSPECIFIED 09/18/2009  . HYPERTENSION 08/21/2006  . GERD 08/21/2006  . SLEEP APNEA 04/21/2009    last testing was negative  . Internal hemorrhoids   . Pulmonary sarcoidosis     Followed locally by pulmonology, but also by Dr. Casper Harrison at [facility]  . Exertional chest pain     sharp, substernal, exertional  . Vocal cord dysfunction   . Chronic diastolic heart failure, NYHA class 2     LVEDP roughly 20% by cath  . CHF (congestive heart failure)   . COPD (chronic obstructive pulmonary disease)   . Depression   . Fracture of 5th metatarsal     non union  . Abnormal SPEP 04/17/2014    SURGICAL HISTORY: Past Surgical History  Procedure Laterality Date  . Ventral hernia repair    . Nissen fundoplication  1610  . Cholecystectomy  1984  . Abdominal hysterectomy    . Knee arthroscopy      right  . Tubal ligation      with reversal in 1994  . Bladder suspension  11/11/2011    Procedure: TRANSVAGINAL TAPE (TVT)  PROCEDURE;  Surgeon: Olga Millers, MD;  Location: [facility];  Service: Gynecology;  Laterality: N/A;  . Cystoscopy  11/11/2011    Procedure: CYSTOSCOPY;  Surgeon: Olga Millers, MD;  Location: [facility];  Service: Gynecology;  Laterality: N/A;  . Doppler echocardiography  02/12/2013    LV FUNCTION, SIZE NORMAL; MILD CONCENTRIC LVH; EST EF 55-65%; WALL MOTION NORMAL  . Lexiscan myoview  03/09/2013    EF 50%; NORMAL MYOCARDIAL PERFUSION STUDY - breast attenuation  . Cardiac catheterization  07/2010    LVEF 50-55% WITH VERY MILD GLOBAL HYPOKINESIA; ESSENTIALLY NORMAL CORONARY ARTERIES; NORMAL LV FUNCTION  . Carotids  02/18/11    CAROTID DUPLEX; VERTEBRALS ARE PATENT WITH ANTEGRADE FLOW. ICA/CCA RATIO 1.61 ON RIGHT AND 0.75 ON LEFT  . Right and left cardiac catheterization  04/23/2013    Angiographic normal coronaries; LVEDP 20 mmHg, PCWP 12-14 mmHg, RAP 12 mmHg.; Fick CO/CI 4.9/2.2  . Metatarsal osteotomy with open reduction internal fixation (orif) metatarsal with fusion Left 04/09/2014    Procedure: LEFT FOOT FRACTURE OPEN TREATMENT METATARSAL INCLUDES INTERNAL FIXATION EACH;  Surgeon: Lorn Junes, MD;  Location: [facility];  Service: Orthopedics;  Laterality: Left;    SOCIAL HISTORY: History   Social History  . Marital Status: Married    Spouse Name: ALEXANDRIA SHIFLETT    Number of Children: 2  . Years of Education: 12   Occupational History  .  DISABLED    Social History Main Topics  . Smoking status: Never Smoker   . Smokeless tobacco: Never Used  . Alcohol Use: No  . Drug Use: No  . Sexual Activity: Yes    Birth Control/ Protection: Surgical   Other Topics Concern  . Not on file   Social History Narrative  . No narrative on file    FAMILY HISTORY: Family History  Problem Relation Age of Onset  . Diabetes Father   . Heart attack Father   . Coronary artery disease Father   . Heart failure Father   . COPD Mother   . Emphysema Mother   . Asthma Mother   .  Heart failure Mother   . Sarcoidosis Maternal Uncle   . Colon cancer Neg Hx   . Lung cancer Brother   . Cancer Brother   . Diabetes Brother   . Heart attack Maternal Grandfather     ALLERGIES:  is allergic to lisinopril; methotrexate; and clindamycin/lincomycin.  MEDICATIONS:  Current Outpatient Prescriptions  Medication Sig Dispense Refill  . albuterol (PROVENTIL HFA;VENTOLIN HFA) 108 (90 BASE) MCG/ACT inhaler Inhale 2 puffs into the lungs every 6 (six) hours as needed. For wheezing.      [name] albuterol (PROVENTIL) (5 MG/ML) 0.5% nebulizer solution Take 2.5 mg by nebulization every 6 (six) hours as needed. For wheezing.      [name] allopurinol (ZYLOPRIM) 100 MG tablet Take 100 mg by mouth at bedtime.       [name] aspirin EC 81 MG tablet Take 81 mg by mouth daily.       [name] atorvastatin (LIPITOR) 40 MG tablet Take one tablet by mouth one time daily  90 tablet  3  . B-D ULTRAFINE III SHORT PEN 31G X 8 MM MISC Follow directions provided by physician  30 each  2  . Blood Glucose Monitoring Suppl (FREESTYLE FREEDOM LITE) W/DEVICE KIT Use as adviced  1 each  0  . buPROPion (WELLBUTRIN XL) 300 MG 24 hr tablet Take 300 mg by mouth daily.      . Canagliflozin 300 MG TABS Take 1 tablet by mouth daily. Take 300 mg by mouth daily in am.      . carvedilol (COREG) 25 MG tablet Take 1 tablet (25 mg total) by mouth 2 (two) times daily with a meal.  180 tablet  3  . cholecalciferol 5000 UNITS TABS Take 1 tablet (5,000 Units total) by mouth daily.  30 tablet  3  . DEXILANT 60 MG capsule Take one capsule by mouth one time daily  30 capsule  5  . diltiazem (CARDIZEM CD) 120 MG 24 hr capsule Take 1 capsule (120 mg total) by mouth every evening.  30 capsule  11  . fenofibrate (TRICOR) 145 MG tablet Take one tablet by mouth one time daily  30 tablet  10  . furosemide (LASIX) 40 MG tablet Take one tablet by mouth twice daily      . glucose blood (ONE TOUCH ULTRA TEST) test strip 1 each by Other route 3 (three) times daily.  Use as instructed  100 each  5  . insulin aspart (NOVOLOG FLEXPEN) 100 UNIT/ML FlexPen Inject 7-12 Units into the skin 3 (three) times daily with meals. Pens please.  15 mL  11  . insulin glargine (LANTUS) 100 UNIT/ML injection Inject 0.28 mLs (28 Units total) into the skin at bedtime. Pens please  15 mL  11  . LORazepam (ATIVAN) 0.5 MG tablet Take 1 tablet (  0.5 mg total) by mouth 2 (two) times daily as needed for anxiety.  30 tablet  1  . losartan (COZAAR) 100 MG tablet Take one tablet by mouth one time daily  90 tablet  1  . metFORMIN (GLUCOPHAGE) 1000 MG tablet Take 1 tablet (1,000 mg total) by mouth 2 (two) times daily with a meal.  180 tablet  3  . mometasone-formoterol (DULERA) 200-5 MCG/ACT AERO Inhale 2 puffs into the lungs 2 (two) times daily.  1 Inhaler  6  . nitroGLYCERIN (NITROSTAT) 0.4 MG SL tablet Place 0.4 mg under the tongue every 5 (five) minutes as needed. x3 doses as needed for chest pain.      [name] nystatin-triamcinolone ointment (MYCOLOG) Apply 1 application topically 2 (two) times daily as needed.      [name] omeprazole (PRILOSEC) 20 MG capsule Take 1 capsule (20 mg total) by mouth 2 (two) times daily before a meal.  90 capsule  1  . oxyCODONE (ROXICODONE) 5 MG immediate release tablet 1-2 tablets every 4-6 hrs as needed for pain  60 tablet  0  . PARoxetine Mesylate (BRISDELLE) 7.5 MG CAPS Take 7.5 mg by mouth daily.      . potassium chloride SA (K-DUR,KLOR-CON) 20 MEQ tablet TAKE ONE TABLET BY MOUTH THREE TIMES DAILY   90 tablet  3  . predniSONE (DELTASONE) 10 MG tablet Take 10 mg by mouth daily with breakfast.      . sucralfate (CARAFATE) 1 GM/10ML suspension Take 10 mLs (1 g total) by mouth 4 (four) times daily -  with meals and at bedtime.  420 mL  0  . venlafaxine XR (EFFEXOR-XR) 75 MG 24 hr capsule TAKE ONE CAPSULE BY MOUTH TWICE DAILY   60 capsule  11   No current facility-administered medications for this visit.    REVIEW OF SYSTEMS:   Eyes: Denies blurriness of vision,  double vision or watery eyes Ears, nose, mouth, throat, and face: Denies mucositis or sore throat Gastrointestinal:  Denies nausea, heartburn or change in bowel habits Skin: Denies abnormal skin rashes Lymphatics: Denies new lymphadenopathy  Neurological:Denies numbness, tingling or new weaknesses Behavioral/Psych: Mood is stable, no new changes  All other systems were reviewed with the patient and are negative.  PHYSICAL EXAMINATION: ECOG PERFORMANCE STATUS: 2 - Symptomatic, <50% confined to bed  Filed Vitals:   04/17/14 0957  BP: 125/66  Pulse: 84  Temp: 97.2 F (36.2 C)  Resp: 19   Filed Weights   04/17/14 0957  Weight: 258 lb 4.8 oz (117.164 kg)    GENERAL:alert, no distress and comfortable. She is morbidly obese and appeared Cushingoid SKIN: skin color, texture, turgor are normal, no rashes or significant lesions. She has thin skin EYES: normal, conjunctiva are pink and non-injected, sclera clear OROPHARYNX:no exudate, no erythema and lips, buccal mucosa, and tongue normal  NECK: supple, thyroid normal size, non-tender, without nodularity LYMPH:  no palpable lymphadenopathy in the cervical, axillary or inguinal LUNGS: clear to auscultation and percussion with normal breathing effort HEART: regular rate & rhythm and no murmurs and moderate bilateral lower extremity edema ABDOMEN:abdomen soft, non-tender and normal bowel sounds Musculoskeletal:no cyanosis of digits and no clubbing. She has a cast over her left tibia. PSYCH: alert & oriented x 3 with fluent speech NEURO: no focal motor/sensory deficits  LABORATORY DATA:  I have reviewed the data as listed Lab Results  Component Value Date   WBC 16.9* 04/17/2014   HGB 12.1 04/17/2014   HCT 36.9 04/17/2014  MCV 87.9 04/17/2014   PLT 436* 04/17/2014    PLAN:  Abnormal SPEP This is likely related to her sarcoidsis. I still think it is worthwhile pursuing additional testing to exclude monclonal gammopathy. I will order blood  work, 24 hour urine collection and skeletal survey  ANEMIA-UNSPECIFIED This is likely anemia of chronic disease. The patient denies recent history of bleeding such as epistaxis, hematuria or hematochezia. She is asymptomatic from the anemia. We will observe for now.  She does not require transfusion now.    Leukocytosis This is due to chronic prednisone therapy. Will observe only  Fracture of left leg Likely due to osteoporosis from chronic prednisone. However, I will proceed with skeletal survey and additional testing to exclude myeloma    Orders Placed This Encounter  Procedures  . DG Bone Survey Met    Standing Status: Future     Number of Occurrences:      Standing Expiration Date: 06/17/2015    Order Specific Question:  Reason for Exam (SYMPTOM  OR DIAGNOSIS REQUIRED)    Answer:  staging myeloma    Order Specific Question:  Is the patient pregnant?    Answer:  No    Order Specific Question:  Preferred imaging location?    Answer:  [facility]  . CBC with Differential    Standing Status: Future     Number of Occurrences: 1     Standing Expiration Date: 06/17/2015  . Comprehensive metabolic panel    Standing Status: Future     Number of Occurrences: 1     Standing Expiration Date: 06/17/2015  . Lactate dehydrogenase    Standing Status: Future     Number of Occurrences: 1     Standing Expiration Date: 06/17/2015  . SPEP & IFE with QIG    Standing Status: Future     Number of Occurrences: 1     Standing Expiration Date: 06/17/2015  . Kappa/lambda light chains    Standing Status: Future     Number of Occurrences: 1     Standing Expiration Date: 06/17/2015  . Beta 2 microglobulin, serum    Standing Status: Future     Number of Occurrences: 1     Standing Expiration Date: 06/17/2015  . Protein Electro, 24-Hour Urine    Standing Status: Future     Number of Occurrences: 1     Standing Expiration Date: 06/17/2015  . IFE, Urine (with Tot Prot)    Standing Status: Future      Number of Occurrences: 1     Standing Expiration Date: 06/17/2015    All questions were answered. The patient knows to call the clinic with any problems, questions or concerns. I spent 30 minutes counseling the patient face to face. The total time spent in the appointment was 40 minutes and more than 50% was on counseling.     [facility], Keyan Folson, MD 04/17/2014 9:21 PM

## 2014-04-17 NOTE — Telephone Encounter (Signed)
gv adn rpinted appt sched and avs fo rpt for July...sent pt to lab

## 2014-04-17 NOTE — Assessment & Plan Note (Signed)
Likely due to osteoporosis from chronic prednisone. However, I will proceed with skeletal survey and additional testing to exclude myeloma

## 2014-04-17 NOTE — Assessment & Plan Note (Signed)
This is due to chronic prednisone therapy. Will observe only

## 2014-04-17 NOTE — Assessment & Plan Note (Signed)
This is likely related to her sarcoidsis. I still think it is worthwhile pursuing additional testing to exclude monclonal gammopathy. I will order blood work, 24 hour urine collection and skeletal survey

## 2014-04-17 NOTE — Assessment & Plan Note (Signed)
This is likely anemia of chronic disease. The patient denies recent history of bleeding such as epistaxis, hematuria or hematochezia. She is asymptomatic from the anemia. We will observe for now.  She does not require transfusion now.   

## 2014-04-17 NOTE — Progress Notes (Signed)
Checked in new pt with no financial concerns. °

## 2014-04-18 ENCOUNTER — Encounter: Payer: Self-pay | Admitting: Internal Medicine

## 2014-04-19 ENCOUNTER — Other Ambulatory Visit: Payer: BC Managed Care – PPO

## 2014-04-19 ENCOUNTER — Ambulatory Visit (HOSPITAL_COMMUNITY)
Admission: RE | Admit: 2014-04-19 | Discharge: 2014-04-19 | Disposition: A | Discharge status: Home / Self Care | Payer: BC Managed Care – PPO | Source: Ambulatory Visit | Attending: Hematology and Oncology | Admitting: Hematology and Oncology

## 2014-04-19 DIAGNOSIS — Z0389 Encounter for observation for other suspected diseases and conditions ruled out: Secondary | ICD-10-CM | POA: Insufficient documentation

## 2014-04-19 DIAGNOSIS — R778 Other specified abnormalities of plasma proteins: Secondary | ICD-10-CM

## 2014-04-19 LAB — SPEP & IFE WITH QIG
Albumin ELP: 58.7 % (ref 55.8–66.1)
Alpha-1-Globulin: 5 % — ABNORMAL HIGH (ref 2.9–4.9)
Alpha-2-Globulin: 13.6 % — ABNORMAL HIGH (ref 7.1–11.8)
Beta 2: 6.2 % (ref 3.2–6.5)
Beta Globulin: 8.7 % — ABNORMAL HIGH (ref 4.7–7.2)
Gamma Globulin: 7.8 % — ABNORMAL LOW (ref 11.1–18.8)
IgA: 137 mg/dL (ref 69–380)
IgG (Immunoglobin G), Serum: 504 mg/dL — ABNORMAL LOW (ref 690–1700)
IgM, Serum: 56 mg/dL (ref 52–322)
Total Protein, Serum Electrophoresis: 6.5 g/dL (ref 6.0–8.3)

## 2014-04-19 LAB — KAPPA/LAMBDA LIGHT CHAINS
Kappa free light chain: 0.83 mg/dL (ref 0.33–1.94)
Kappa:Lambda Ratio: 0.86 (ref 0.26–1.65)
Lambda Free Lght Chn: 0.97 mg/dL (ref 0.57–2.63)

## 2014-04-19 LAB — BETA 2 MICROGLOBULIN, SERUM: Beta-2 Microglobulin: 2.7 mg/L — ABNORMAL HIGH (ref <=2.51)

## 2014-04-24 ENCOUNTER — Ambulatory Visit: Payer: BC Managed Care – PPO | Admitting: Dietician

## 2014-04-24 LAB — UIFE/LIGHT CHAINS/TP QN, 24-HR UR
Albumin, U: DETECTED
Alpha 1, Urine: DETECTED — AB
Alpha 2, Urine: DETECTED — AB
Beta, Urine: DETECTED — AB
Free Kappa Lt Chains,Ur: 0.76 mg/dL (ref 0.14–2.42)
Free Kappa/Lambda Ratio: 4 ratio (ref 2.04–10.37)
Free Lambda Excretion/Day: 3.61 mg/d
Free Lambda Lt Chains,Ur: 0.19 mg/dL (ref 0.02–0.67)
Free Lt Chn Excr Rate: 14.44 mg/d
Gamma Globulin, Urine: DETECTED — AB
Time: 24 hours
Total Protein, Urine-Ur/day: 141 mg/d — ABNORMAL HIGH (ref 10–140)
Total Protein, Urine: 7.4 mg/dL
Volume, Urine: 1900 mL

## 2014-04-24 LAB — UPEP/TP, 24-HR URINE
Albumin: 33.4 %
Alpha-1-Globulin, U: 29.9 %
Alpha-2-Globulin, U: 12.5 %
Beta Globulin, U: 17.7 %
Collection Interval: 24 hours
Gamma Globulin, U: 6.5 %
Total Protein, Urine/Day: 228 mg/d — ABNORMAL HIGH (ref 50–100)
Total Protein, Urine: 12 mg/dL
Total Volume, Urine: 1900 mL

## 2014-04-25 ENCOUNTER — Ambulatory Visit: Payer: BC Managed Care – PPO | Admitting: Internal Medicine

## 2014-04-26 ENCOUNTER — Ambulatory Visit (HOSPITAL_BASED_OUTPATIENT_CLINIC_OR_DEPARTMENT_OTHER): Payer: BC Managed Care – PPO | Admitting: Hematology and Oncology

## 2014-04-26 ENCOUNTER — Encounter: Payer: Self-pay | Admitting: Hematology and Oncology

## 2014-04-26 VITALS — BP 151/80 | HR 66 | Temp 97.5°F | Resp 19 | Ht 66.0 in | Wt 262.0 lb

## 2014-04-26 DIAGNOSIS — S8292XG Unspecified fracture of left lower leg, subsequent encounter for closed fracture with delayed healing: Secondary | ICD-10-CM

## 2014-04-26 DIAGNOSIS — R799 Abnormal finding of blood chemistry, unspecified: Secondary | ICD-10-CM | POA: Diagnosis not present

## 2014-04-26 DIAGNOSIS — M810 Age-related osteoporosis without current pathological fracture: Secondary | ICD-10-CM

## 2014-04-26 DIAGNOSIS — R778 Other specified abnormalities of plasma proteins: Secondary | ICD-10-CM

## 2014-04-26 DIAGNOSIS — M8448XA Pathological fracture, other site, initial encounter for fracture: Secondary | ICD-10-CM | POA: Diagnosis not present

## 2014-04-26 DIAGNOSIS — D72829 Elevated white blood cell count, unspecified: Secondary | ICD-10-CM

## 2014-04-26 NOTE — Assessment & Plan Note (Signed)
This is due to chronic prednisone therapy. Will observe only

## 2014-04-26 NOTE — Progress Notes (Signed)
[place], [place] SUMMARY OF HEMATOLOGIC HISTORY: This patient was referred here because of leg fracture, chronic prednisone therapy for sarcoidosis and abnormal blood work. SPEP dated 03/20/14 did not detect any M Spike but she has low IgG level and appeared to have increased alpha and beta globulins. She has chronic pulmonary sarcoidosis with biopsy proven disease and had been on prednisone for over 5 years. Recently, she had spontaneous left leg fracture not preceded with any known trauma.  Patient denies history of recurrent infection or atypical infections such as shingles of meningitis. Denies chills, night sweats, anorexia or abnormal weight loss. INTERVAL HISTORY: [name] 53 y.o. female returns for further followup. She has no new concerns. I have reviewed the past medical history, past surgical history, social history and family history with the patient and they are unchanged from previous note.  ALLERGIES:  is allergic to lisinopril; methotrexate; and clindamycin/lincomycin.  MEDICATIONS:  Current Outpatient Prescriptions  Medication Sig Dispense Refill  . albuterol (PROVENTIL HFA;VENTOLIN HFA) 108 (90 BASE) MCG/ACT inhaler Inhale 2 puffs into the lungs every 6 (six) hours as needed. For wheezing.      [name] albuterol (PROVENTIL) (5 MG/ML) 0.5% nebulizer solution Take 2.5 mg by nebulization every 6 (six) hours as needed. For wheezing.      [name] allopurinol (ZYLOPRIM) 100 MG tablet Take 100 mg by mouth at bedtime.       [name] aspirin EC 81 MG tablet Take 81 mg by mouth daily.       [name] atorvastatin (LIPITOR) 40 MG tablet Take one tablet by mouth one time daily  90 tablet  3  . B-D ULTRAFINE III SHORT PEN 31G X 8 MM MISC Follow directions provided by physician  30 each  2  . Blood Glucose Monitoring Suppl (FREESTYLE FREEDOM LITE) W/DEVICE KIT Use as adviced  1 each  0  . buPROPion (WELLBUTRIN XL) 300 MG 24 hr tablet Take 300 mg by  mouth daily.      . Canagliflozin 300 MG TABS Take 1 tablet by mouth daily. Take 300 mg by mouth daily in am.      . carvedilol (COREG) 25 MG tablet Take 1 tablet (25 mg total) by mouth 2 (two) times daily with a meal.  180 tablet  3  . cefUROXime (CEFTIN) 500 MG tablet       . cholecalciferol 5000 UNITS TABS Take 1 tablet (5,000 Units total) by mouth daily.  30 tablet  3  . DEXILANT 60 MG capsule Take one capsule by mouth one time daily  30 capsule  5  . diltiazem (CARDIZEM CD) 120 MG 24 hr capsule Take 1 capsule (120 mg total) by mouth every evening.  30 capsule  11  . estradiol (ESTRACE) 2 MG tablet       . fenofibrate (TRICOR) 145 MG tablet Take one tablet by mouth one time daily  30 tablet  10  . furosemide (LASIX) 40 MG tablet Take one tablet by mouth twice daily      . glipiZIDE (GLUCOTROL XL) 10 MG 24 hr tablet       . glucose blood (ONE TOUCH ULTRA TEST) test strip 1 each by Other route 3 (three) times daily. Use as instructed  100 each  5  . insulin aspart (NOVOLOG FLEXPEN) 100 UNIT/ML FlexPen Inject 7-12 Units into the skin 3 (three) times daily with meals. Pens please.  15 mL  11  . LORazepam (ATIVAN)  0.5 MG tablet Take 1 tablet (0.5 mg total) by mouth 2 (two) times daily as needed for anxiety.  30 tablet  1  . losartan (COZAAR) 100 MG tablet Take one tablet by mouth one time daily  90 tablet  1  . metFORMIN (GLUCOPHAGE) 1000 MG tablet Take 1 tablet (1,000 mg total) by mouth 2 (two) times daily with a meal.  180 tablet  3  . mometasone-formoterol (DULERA) 200-5 MCG/ACT AERO Inhale 2 puffs into the lungs 2 (two) times daily.  1 Inhaler  6  . nitroGLYCERIN (NITROSTAT) 0.4 MG SL tablet Place 0.4 mg under the tongue every 5 (five) minutes as needed. x3 doses as needed for chest pain.      [name] nystatin-triamcinolone ointment (MYCOLOG) Apply 1 application topically 2 (two) times daily as needed.      [name] omeprazole (PRILOSEC) 20 MG capsule Take 1 capsule (20 mg total) by mouth 2 (two) times  daily before a meal.  90 capsule  1  . oxyCODONE (ROXICODONE) 5 MG immediate release tablet 1-2 tablets every 4-6 hrs as needed for pain  60 tablet  0  . PARoxetine Mesylate (BRISDELLE) 7.5 MG CAPS Take 7.5 mg by mouth daily.      . potassium chloride SA (K-DUR,KLOR-CON) 20 MEQ tablet TAKE ONE TABLET BY MOUTH THREE TIMES DAILY   90 tablet  3  . predniSONE (DELTASONE) 10 MG tablet Take 10 mg by mouth daily with breakfast.      . sucralfate (CARAFATE) 1 GM/10ML suspension Take 10 mLs (1 g total) by mouth 4 (four) times daily -  with meals and at bedtime.  420 mL  0  . venlafaxine XR (EFFEXOR-XR) 75 MG 24 hr capsule TAKE ONE CAPSULE BY MOUTH TWICE DAILY   60 capsule  11  . insulin glargine (LANTUS) 100 UNIT/ML injection Inject 0.28 mLs (28 Units total) into the skin at bedtime. Pens please  15 mL  11   No current facility-administered medications for this visit.     REVIEW OF SYSTEMS:   Constitutional: Denies fevers, chills or night sweats Eyes: Denies blurriness of vision Ears, nose, mouth, throat, and face: Denies mucositis or sore throat Respiratory: Denies cough, dyspnea or wheezes Cardiovascular: Denies palpitation, chest discomfort or lower extremity swelling Gastrointestinal:  Denies nausea, heartburn or change in bowel habits Skin: Denies abnormal skin rashes Lymphatics: Denies new lymphadenopathy or easy bruising Neurological:Denies numbness, tingling or new weaknesses Behavioral/Psych: Mood is stable, no new changes  All other systems were reviewed with the patient and are negative.  PHYSICAL EXAMINATION: ECOG PERFORMANCE STATUS: 0 - Asymptomatic  Filed Vitals:   04/26/14 1157  BP: 151/80  Pulse: 66  Temp: 97.5 F (36.4 C)  Resp: 19   Filed Weights   04/26/14 1157  Weight: 262 lb (118.842 kg)    GENERAL:alert, no distress and comfortable. She is obese and cushingoid SKIN: skin color, texture, turgor are normal, no rashes or significant lesions Musculoskeletal:no  cyanosis of digits and no clubbing  NEURO: alert & oriented x 3 with fluent speech, no focal motor/sensory deficits  LABORATORY DATA:  I have reviewed the data as listed No results found for this or any previous visit (from the past 48 hour(s)).  Lab Results  Component Value Date   WBC 16.9* 04/17/2014   HGB 12.1 04/17/2014   HCT 36.9 04/17/2014   MCV 87.9 04/17/2014   PLT 436* 04/17/2014    RADIOGRAPHIC STUDIES: I reviewed her skeletal survey and was negative. I  have personally reviewed the radiological images as listed and agreed with the findings in the report.  ASSESSMENT & PLAN:  Abnormal SPEP This is likely related to her sarcoidsis. Additional workup excluded paraproteinemia. She does any further followup.   Leukocytosis This is due to chronic prednisone therapy. Will observe only    Fracture of left leg Likely due to osteoporosis from chronic prednisone. Additional skeletal survey excluded myeloma.     All questions were answered. The patient knows to call the clinic with any problems, questions or concerns. No barriers to learning was detected.  I spent 15 minutes counseling the patient face to face. The total time spent in the appointment was 20 minutes and more than 50% was on counseling.     [place], [place], MD 04/26/2014 1:08 PM   \

## 2014-04-26 NOTE — Assessment & Plan Note (Signed)
This is likely related to her sarcoidsis. Additional workup excluded paraproteinemia. She does any further followup.

## 2014-04-26 NOTE — Assessment & Plan Note (Signed)
Likely due to osteoporosis from chronic prednisone. Additional skeletal survey excluded myeloma.

## 2014-05-01 ENCOUNTER — Ambulatory Visit: Payer: BC Managed Care – PPO | Admitting: Critical Care Medicine

## 2014-05-03 ENCOUNTER — Ambulatory Visit: Payer: BC Managed Care – PPO | Admitting: Critical Care Medicine

## 2014-05-06 ENCOUNTER — Encounter: Payer: Self-pay | Admitting: Critical Care Medicine

## 2014-05-06 ENCOUNTER — Telehealth: Payer: Self-pay | Admitting: Internal Medicine

## 2014-05-06 ENCOUNTER — Ambulatory Visit (INDEPENDENT_AMBULATORY_CARE_PROVIDER_SITE_OTHER): Payer: BC Managed Care – PPO | Admitting: Critical Care Medicine

## 2014-05-06 VITALS — BP 130/60 | HR 81 | Temp 97.8°F | Ht 66.5 in

## 2014-05-06 DIAGNOSIS — J99 Respiratory disorders in diseases classified elsewhere: Secondary | ICD-10-CM

## 2014-05-06 DIAGNOSIS — D860 Sarcoidosis of lung: Secondary | ICD-10-CM

## 2014-05-06 DIAGNOSIS — Z23 Encounter for immunization: Secondary | ICD-10-CM | POA: Diagnosis not present

## 2014-05-06 DIAGNOSIS — J449 Chronic obstructive pulmonary disease, unspecified: Secondary | ICD-10-CM | POA: Diagnosis not present

## 2014-05-06 DIAGNOSIS — D869 Sarcoidosis, unspecified: Secondary | ICD-10-CM | POA: Diagnosis not present

## 2014-05-06 MED ORDER — INSULIN GLARGINE 100 UNIT/ML SOLOSTAR PEN: 26.0000 [IU] | PEN_INJECTOR | Freq: Every day | SUBCUTANEOUS | Status: DC

## 2014-05-06 NOTE — Assessment & Plan Note (Signed)
Obstructive bronchitis without exacerbation Maintain inhaled medications Administer Prevnar 13

## 2014-05-06 NOTE — Patient Instructions (Signed)
Prevnar 13 was given No change in medications Return 3 months

## 2014-05-06 NOTE — Telephone Encounter (Signed)
Patients husband called wanting lantus refilled   Pharmacy: [facility]   Thank you

## 2014-05-06 NOTE — Assessment & Plan Note (Signed)
Stable sarcoidosis Plan Maintain low-dose prednisone Note this is being managed by sarcoidosis clinic in [facility]

## 2014-05-06 NOTE — Progress Notes (Signed)
Subjective:    Patient ID: [name], female    DOB: [birth year], 53 y.o.   MRN: [id number]  HPI  85 WF never smoker with Sarcoid, GERD, VCD, cyclical cough     12/30/252  [facility] follow up  Patient returns for a post hospital followup. Patient was admitted June 3 for asthmatic bronchitic exacerbation. She was treated with IV antibiotics, steroids, and nebulized bronchodilators. She was discharged on Levaquin and prednisone taper. She is finished all of her antibiotics and is now back to her prednisone 10 mg daily. Doses for her chronic steroids for sarcoid. Since discharge. Patient says that she is feeling much improved with decreased cough, congestion, and wheezing. She denies any hemoptysis, orthopnea, PND, leg swelling, bloody stools, or diarrhea.  She has an upcoming surgery scheduled with orthopedics. Planned for a  left ORIF of the fifth metatarsal secondary to fracture. She requires, pulmonary, preop clearance . Patient has underlying sarcoidosis managed on chronic steroids. She has a history of reactive airways/VCD  with cyclical cough. Patient is followed at [facility] for her sarcoidosis. Previous , PFT, and 2013 was reviewed with restrictive changes noted with, FVC at 68%. In office spirometry today shows moderate restriction with an FVC of 60%, FEV1 at 59%, and a ratio of 79. Patient is not on any supplemental oxygen, O2 saturation in the office is  98% on room air.  She reports previous sleep study was negative for sleep apnea, however, could not find these results in the computer system. She denies any previous use of CPAP .  CT chest 01/21/14 showed stable RUL nodule x 2 yrs. No hilar adenopathy.   05/06/2014 Chief Complaint  Patient presents with  . 6 wk follow up    Breathing doing well overall.  DOE is at baseline.  No chest tightness/pain, cough, or wheezing.  Pt just had surgery on the L foot. Screw in foot.  No active dyspnea.  No cough no chest  pain.   Pt denies any significant sore throat, nasal congestion or excess secretions, fever, chills, sweats, unintended weight loss, pleurtic or exertional chest pain, orthopnea PND, or leg swelling Pt denies any increase in rescue therapy over baseline, denies waking up needing it or having any early am or nocturnal exacerbations of coughing/wheezing/or dyspnea. Pt also denies any obvious fluctuation in symptoms with  weather or environmental change or other alleviating or aggravating factors  Review of Systems Constitutional:   No  weight loss, night sweats,  Fevers, chills, fatigue, or  lassitude.  HEENT:   No headaches,  Difficulty swallowing,  Tooth/dental problems, or  Sore throat,                No sneezing, itching, ear ache, nasal congestion, post nasal drip,   CV:  No chest pain,  Orthopnea, PND, swelling in lower extremities, anasarca, dizziness, palpitations, syncope.   GI  No heartburn, indigestion, abdominal pain, nausea, vomiting, diarrhea, change in bowel habits, loss of appetite, bloody stools.   Resp: No shortness of breath with exertion or at rest.  No excess mucus, no productive cough,  No non-productive cough,  No coughing up of blood.  No change in color of mucus.  No wheezing.  No chest wall deformity  Skin: no rash or lesions.  GU: no dysuria, change in color of urine, no urgency or frequency.  No flank pain, no hematuria   MS:  No joint pain or swelling.  No decreased range of motion.  +  foot pain   Psych:  No change in mood or affect. No depression or anxiety.  No memory loss.      Objective:   Physical Exam BP 130/60  Pulse 81  Temp(Src) 97.8 F (36.6 C) (Oral)  Ht 5' 6.5" (1.689 m)  SpO2 98%  GEN: A/Ox3; pleasant , NAD   HEENT:  [place]/AT,  EACs-clear, TMs-wnl, NOSE-clear drainage THROAT-clear, no lesions, no postnasal drip or exudate noted.   NECK:  Supple w/ fair ROM; no JVD; normal carotid impulses w/o bruits; no thyromegaly or nodules palpated; no  lymphadenopathy.  RESP  Diminished BS in bases, w/ no wheezing noted   CARD:  RRR, no m/r/g  , no peripheral edema, pulses intact, no cyanosis or clubbing.  GI:   Soft & nt; nml bowel sounds; no organomegaly or masses detected.  Musco: Warm bil, no deformities or joint swelling noted.   Neuro: alert, no focal deficits noted.    Skin: Warm, no lesions or rashes         Assessment & Plan:   Sarcoidosis of lung Stable sarcoidosis Plan Maintain low-dose prednisone Note this is being managed by sarcoidosis clinic in [name]  Obstructive chronic bronchitis without exacerbation Obstructive bronchitis without exacerbation Maintain inhaled medications Administer Prevnar 13   Updated Medication List Outpatient Encounter Prescriptions as of 05/06/2014  Medication Sig  . albuterol (PROVENTIL HFA;VENTOLIN HFA) 108 (90 BASE) MCG/ACT inhaler Inhale 2 puffs into the lungs every 6 (six) hours as needed. For wheezing.  [name] albuterol (PROVENTIL) (5 MG/ML) 0.5% nebulizer solution Take 2.5 mg by nebulization every 6 (six) hours as needed. For wheezing.  [name] allopurinol (ZYLOPRIM) 100 MG tablet Take 100 mg by mouth at bedtime.   [name] aspirin EC 81 MG tablet Take 81 mg by mouth daily.   [name] atorvastatin (LIPITOR) 40 MG tablet Take one tablet by mouth one time daily  . azithromycin (ZITHROMAX) 250 MG tablet Take 250 mg by mouth 3 (three) times a week.  . B-D ULTRAFINE III SHORT PEN 31G X 8 MM MISC Follow directions provided by physician  . Blood Glucose Monitoring Suppl (FREESTYLE FREEDOM LITE) W/DEVICE KIT Use as adviced  . buPROPion (WELLBUTRIN XL) 300 MG 24 hr tablet Take 300 mg by mouth daily.  . Canagliflozin 300 MG TABS Take 1 tablet by mouth daily. Take 300 mg by mouth daily in am.  . carvedilol (COREG) 25 MG tablet Take 1 tablet (25 mg total) by mouth 2 (two) times daily with a meal.  . cholecalciferol 5000 UNITS TABS Take 1 tablet (5,000 Units total) by mouth daily.  [name] DEXILANT 60 MG  capsule Take one capsule by mouth one time daily  . diltiazem (CARDIZEM CD) 120 MG 24 hr capsule Take 1 capsule (120 mg total) by mouth every evening.  [name] estradiol (ESTRACE) 2 MG tablet Take by mouth daily.   . fenofibrate (TRICOR) 145 MG tablet Take one tablet by mouth one time daily  . furosemide (LASIX) 40 MG tablet Take one tablet by mouth twice daily  . glipiZIDE (GLUCOTROL XL) 10 MG 24 hr tablet Take 10 mg by mouth daily.   [name] glucose blood (ONE TOUCH ULTRA TEST) test strip 1 each by Other route 3 (three) times daily. Use as instructed  . insulin aspart (NOVOLOG FLEXPEN) 100 UNIT/ML FlexPen Inject 7-12 Units into the skin 3 (three) times daily with meals. Pens please.  . insulin glargine (LANTUS) 100 UNIT/ML injection Inject 0.28 mLs (28 Units total) into the skin  at bedtime. Pens please  . LORazepam (ATIVAN) 0.5 MG tablet Take 1 tablet (0.5 mg total) by mouth 2 (two) times daily as needed for anxiety.  [name] losartan (COZAAR) 100 MG tablet Take one tablet by mouth one time daily  . metFORMIN (GLUCOPHAGE) 1000 MG tablet Take 1 tablet (1,000 mg total) by mouth 2 (two) times daily with a meal.  . mometasone-formoterol (DULERA) 200-5 MCG/ACT AERO Inhale 2 puffs into the lungs 2 (two) times daily.  . nitroGLYCERIN (NITROSTAT) 0.4 MG SL tablet Place 0.4 mg under the tongue every 5 (five) minutes as needed. x3 doses as needed for chest pain.  [name] nystatin-triamcinolone ointment (MYCOLOG) Apply 1 application topically 2 (two) times daily as needed.  [name] omeprazole (PRILOSEC) 20 MG capsule Take 1 capsule (20 mg total) by mouth 2 (two) times daily before a meal.  . oxyCODONE (ROXICODONE) 5 MG immediate release tablet 1-2 tablets every 4-6 hrs as needed for pain  . PARoxetine Mesylate (BRISDELLE) 7.5 MG CAPS Take 7.5 mg by mouth daily.  . potassium chloride SA (K-DUR,KLOR-CON) 20 MEQ tablet TAKE ONE TABLET BY MOUTH THREE TIMES DAILY   . predniSONE (DELTASONE) 10 MG tablet Take 10 mg by mouth daily with  breakfast.  . sucralfate (CARAFATE) 1 GM/10ML suspension Take 10 mLs (1 g total) by mouth 4 (four) times daily -  with meals and at bedtime.  [name] venlafaxine XR (EFFEXOR-XR) 75 MG 24 hr capsule TAKE ONE CAPSULE BY MOUTH TWICE DAILY   . [DISCONTINUED] cefUROXime (CEFTIN) 500 MG tablet

## 2014-05-07 ENCOUNTER — Other Ambulatory Visit: Payer: Self-pay | Admitting: Critical Care Medicine

## 2014-05-10 ENCOUNTER — Other Ambulatory Visit: Payer: Self-pay | Admitting: Internal Medicine

## 2014-05-10 DIAGNOSIS — E119 Type 2 diabetes mellitus without complications: Secondary | ICD-10-CM

## 2014-05-14 ENCOUNTER — Other Ambulatory Visit: Payer: Self-pay | Admitting: *Deleted

## 2014-05-14 ENCOUNTER — Ambulatory Visit: Payer: BC Managed Care – PPO | Admitting: Internal Medicine

## 2014-05-14 MED ORDER — OMEPRAZOLE 20 MG PO CPDR
20.0000 mg | DELAYED_RELEASE_CAPSULE | Freq: Two times a day (BID) | ORAL | Status: DC
Start: 2014-05-14 — End: 2014-08-21

## 2014-05-17 ENCOUNTER — Telehealth: Payer: Self-pay | Admitting: Internal Medicine

## 2014-05-17 ENCOUNTER — Other Ambulatory Visit: Payer: Self-pay | Admitting: *Deleted

## 2014-05-17 MED ORDER — FLUCONAZOLE 150 MG PO TABS
150.0000 mg | ORAL_TABLET | Freq: Once | ORAL | Status: DC
Start: 2014-05-17 — End: 2014-07-15

## 2014-05-17 NOTE — Telephone Encounter (Signed)
Patient stated meds Janett Billow gave her a yeast infection, could Dr Cruzita Lederer send something for her. Please advise

## 2014-05-17 NOTE — Telephone Encounter (Signed)
Let's call in Diflucan 150 mg #1 with 1 refill.

## 2014-05-17 NOTE — Telephone Encounter (Signed)
Please read note below and advise.  

## 2014-05-20 LAB — HM DIABETES EYE EXAM

## 2014-05-29 ENCOUNTER — Other Ambulatory Visit: Payer: Self-pay | Admitting: Internal Medicine

## 2014-05-29 ENCOUNTER — Encounter: Payer: Self-pay | Admitting: Family Medicine

## 2014-06-05 ENCOUNTER — Ambulatory Visit: Payer: BC Managed Care – PPO | Admitting: Dietician

## 2014-06-11 DIAGNOSIS — I5032 Chronic diastolic (congestive) heart failure: Secondary | ICD-10-CM | POA: Insufficient documentation

## 2014-06-11 DIAGNOSIS — D869 Sarcoidosis, unspecified: Secondary | ICD-10-CM | POA: Diagnosis not present

## 2014-06-12 ENCOUNTER — Ambulatory Visit: Payer: BC Managed Care – PPO | Admitting: Internal Medicine

## 2014-06-14 ENCOUNTER — Other Ambulatory Visit: Payer: Self-pay | Admitting: Cardiology

## 2014-06-14 NOTE — Telephone Encounter (Signed)
Rx refill sent to patient pharmacy   

## 2014-06-19 DIAGNOSIS — S63659A Sprain of metacarpophalangeal joint of unspecified finger, initial encounter: Secondary | ICD-10-CM | POA: Diagnosis not present

## 2014-06-24 ENCOUNTER — Ambulatory Visit: Payer: BC Managed Care – PPO | Admitting: Internal Medicine

## 2014-06-27 ENCOUNTER — Ambulatory Visit (INDEPENDENT_AMBULATORY_CARE_PROVIDER_SITE_OTHER): Payer: BC Managed Care – PPO | Admitting: Family Medicine

## 2014-06-27 ENCOUNTER — Encounter: Payer: Self-pay | Admitting: Family Medicine

## 2014-06-27 VITALS — BP 140/80 | HR 91 | Temp 98.0°F | Wt 265.0 lb

## 2014-06-27 DIAGNOSIS — J99 Respiratory disorders in diseases classified elsewhere: Secondary | ICD-10-CM

## 2014-06-27 DIAGNOSIS — D869 Sarcoidosis, unspecified: Secondary | ICD-10-CM

## 2014-06-27 DIAGNOSIS — J209 Acute bronchitis, unspecified: Secondary | ICD-10-CM

## 2014-06-27 DIAGNOSIS — D860 Sarcoidosis of lung: Secondary | ICD-10-CM

## 2014-06-27 MED ORDER — PREDNISONE 10 MG PO TABS: ORAL_TABLET | ORAL | Status: DC

## 2014-06-27 MED ORDER — LEVOFLOXACIN 500 MG PO TABS: 500.0000 mg | ORAL_TABLET | Freq: Every day | ORAL | Status: DC

## 2014-06-27 NOTE — Progress Notes (Signed)
Pre visit review using our clinic review tool, if applicable. No additional management support is needed unless otherwise documented below in the visit note. 

## 2014-06-27 NOTE — Progress Notes (Signed)
Subjective:    Patient ID: [name], female    DOB: October 18, 1960, 53 y.o.   MRN: [id number]  HPI Acute visit. Patient has history of sarcoidosis and is seen with approximate one-week history of productive cough and some increased shortness of breath and possibly some mild wheezing. Her other chronic problems include history of obesity, type 2 diabetes, diastolic heart failure, hypertension, GERD. She takes prednisone at baseline 9 mg daily. She had respiratory infection back in June of this year was admitted and treated with steroids and antibiotics. She takes albuterol through nebulizer at home and also is on Dulera twice daily. Denies any recent hemoptysis. No reported fever. No dyspnea at rest.  Past Medical History  Diagnosis Date  . DIABETES MELLITUS, TYPE II 08/21/2006  . HYPERLIPIDEMIA 08/21/2006  . GOUT 08/20/2010  . OBESITY 06/04/2009  . ANEMIA-UNSPECIFIED 09/18/2009  . HYPERTENSION 08/21/2006  . GERD 08/21/2006  . SLEEP APNEA 04/21/2009    last testing was negative  . Internal hemorrhoids   . Pulmonary sarcoidosis     Followed locally by pulmonology, but also by Dr. Casper Harrison at [facility]  . Exertional chest pain     sharp, substernal, exertional  . Vocal cord dysfunction   . Chronic diastolic heart failure, NYHA class 2     LVEDP roughly 20% by cath  . CHF (congestive heart failure)   . COPD (chronic obstructive pulmonary disease)   . Depression   . Fracture of 5th metatarsal     non union  . Abnormal SPEP 04/17/2014   Past Surgical History  Procedure Laterality Date  . Ventral hernia repair    . Nissen fundoplication  7371  . Cholecystectomy  1984  . Abdominal hysterectomy    . Knee arthroscopy      right  . Tubal ligation      with reversal in 1994  . Bladder suspension  11/11/2011    Procedure: TRANSVAGINAL TAPE (TVT) PROCEDURE;  Surgeon: Olga Millers, MD;  Location: [facility];  Service: Gynecology;  Laterality: N/A;  . Cystoscopy  11/11/2011    Procedure: CYSTOSCOPY;  Surgeon: Olga Millers, MD;  Location: [facility];  Service: Gynecology;  Laterality: N/A;  . Doppler echocardiography  02/12/2013    LV FUNCTION, SIZE NORMAL; MILD CONCENTRIC LVH; EST EF 55-65%; WALL MOTION NORMAL  . Lexiscan myoview  03/09/2013    EF 50%; NORMAL MYOCARDIAL PERFUSION STUDY - breast attenuation  . Cardiac catheterization  07/2010    LVEF 50-55% WITH VERY MILD GLOBAL HYPOKINESIA; ESSENTIALLY NORMAL CORONARY ARTERIES; NORMAL LV FUNCTION  . Carotids  02/18/11    CAROTID DUPLEX; VERTEBRALS ARE PATENT WITH ANTEGRADE FLOW. ICA/CCA RATIO 1.61 ON RIGHT AND 0.75 ON LEFT  . Right and left cardiac catheterization  04/23/2013    Angiographic normal coronaries; LVEDP 20 mmHg, PCWP 12-14 mmHg, RAP 12 mmHg.; Fick CO/CI 4.9/2.2  . Metatarsal osteotomy with open reduction internal fixation (orif) metatarsal with fusion Left 04/09/2014    Procedure: LEFT FOOT FRACTURE OPEN TREATMENT METATARSAL INCLUDES INTERNAL FIXATION EACH;  Surgeon: Lorn Junes, MD;  Location: [facility];  Service: Orthopedics;  Laterality: Left;    reports that she has never smoked. She has never used smokeless tobacco. She reports that she does not drink alcohol or use illicit drugs. family history includes Asthma in her mother; COPD in her mother; Cancer in her brother; Coronary artery disease in her father; Diabetes in her brother and father; Emphysema in her mother; Heart  attack in her father and maternal grandfather; Heart failure in her father and mother; Lung cancer in her brother; Sarcoidosis in her maternal uncle. There is no history of Colon cancer. Allergies  Allergen Reactions  . Lisinopril Cough  . Methotrexate     REACTION: peri-oral and buccal lesions.  . Clindamycin/Lincomycin Nausea And Vomiting and Rash      Review of Systems  Constitutional: Negative for fever, chills, appetite change and unexpected weight change.  HENT: Positive for congestion. Negative for sore  throat.   Respiratory: Positive for cough, shortness of breath and wheezing.   Cardiovascular: Negative for chest pain, palpitations and leg swelling.  Neurological: Negative for dizziness.       Objective:   Physical Exam  Constitutional: She appears well-developed and well-nourished.  HENT:  Mouth/Throat: Oropharynx is clear and moist.  Neck: Neck supple. No thyromegaly present.  Cardiovascular: Normal rate and regular rhythm.   Pulmonary/Chest: Effort normal. She has no rales.  She has a few faint expiratory wheezes. No rales. No retractions  Musculoskeletal: She exhibits no edema.          Assessment & Plan:  Acute bronchitis in a patient with sarcoidosis on chronic steroids. We explained this may all be viral but given her underlying risk will start prednisone burst and taper back to her usual dose of prednisone. Levaquin 500 milligrams once daily for 7 days. Followup promptly for any fever or worsening symptoms. Continue her usual inhalers Monitor blood sugars closely with steroids. She plans to get Flu vaccine in October.

## 2014-06-27 NOTE — Patient Instructions (Signed)
Follow up for any fever or worsening symptoms. 

## 2014-07-01 ENCOUNTER — Encounter: Payer: Self-pay | Admitting: Internal Medicine

## 2014-07-01 ENCOUNTER — Telehealth: Payer: Self-pay | Admitting: Internal Medicine

## 2014-07-01 ENCOUNTER — Ambulatory Visit (INDEPENDENT_AMBULATORY_CARE_PROVIDER_SITE_OTHER): Payer: BC Managed Care – PPO | Admitting: Internal Medicine

## 2014-07-01 VITALS — BP 122/68 | HR 87 | Temp 98.2°F | Resp 12 | Wt 262.6 lb

## 2014-07-01 DIAGNOSIS — E1165 Type 2 diabetes mellitus with hyperglycemia: Secondary | ICD-10-CM

## 2014-07-01 DIAGNOSIS — IMO0002 Reserved for concepts with insufficient information to code with codable children: Secondary | ICD-10-CM

## 2014-07-01 DIAGNOSIS — E1149 Type 2 diabetes mellitus with other diabetic neurological complication: Secondary | ICD-10-CM

## 2014-07-01 DIAGNOSIS — E119 Type 2 diabetes mellitus without complications: Secondary | ICD-10-CM

## 2014-07-01 LAB — BASIC METABOLIC PANEL
BUN: 17 mg/dL (ref 6–23)
CO2: 34 mEq/L — ABNORMAL HIGH (ref 19–32)
Calcium: 9.7 mg/dL (ref 8.4–10.5)
Chloride: 99 mEq/L (ref 96–112)
Creatinine, Ser: 0.7 mg/dL (ref 0.4–1.2)
GFR: 98.86 mL/min (ref >60.00)
Glucose, Bld: 195 mg/dL — ABNORMAL HIGH (ref 70–99)
Potassium: 3.6 mEq/L (ref 3.5–5.1)
Sodium: 139 mEq/L (ref 135–145)

## 2014-07-01 LAB — MICROALBUMIN / CREATININE URINE RATIO
Creatinine,U: 208.2 mg/dL
Microalb Creat Ratio: 0.4 mg/g (ref 0.0–30.0)
Microalb, Ur: 0.9 mg/dL (ref 0.0–1.9)

## 2014-07-01 LAB — HEMOGLOBIN A1C: Hgb A1c MFr Bld: 10.9 % — ABNORMAL HIGH (ref 4.6–6.5)

## 2014-07-01 MED ORDER — INSULIN ASPART 100 UNIT/ML FLEXPEN: 8.0000 [IU] | PEN_INJECTOR | Freq: Three times a day (TID) | SUBCUTANEOUS | Status: DC

## 2014-07-01 MED ORDER — GABAPENTIN 100 MG PO CAPS: 100.0000 mg | ORAL_CAPSULE | Freq: Three times a day (TID) | ORAL | Status: DC

## 2014-07-01 MED ORDER — INSULIN GLARGINE 100 UNIT/ML SOLOSTAR PEN: 35.0000 [IU] | PEN_INJECTOR | Freq: Every day | SUBCUTANEOUS | Status: DC

## 2014-07-01 NOTE — Telephone Encounter (Signed)
Oh, I am sorry - I forgot to take it from her Pt instructions. We did discuss at the time of the visit to stop it but in the discharge papers I mentioned to continue. She needs to stop it. Sorry for the confusion.

## 2014-07-01 NOTE — Progress Notes (Signed)
Patient ID: [name], female   DOB: 04/30/1961, 53 y.o.   MRN: [id number]  HPI: [name] is a 53 y.o.-year-old female, returning for f/u DM2, dx 2007, insulin-dependent, uncontrolled, with complications (CHF stage 2, peripheral neuropathy). Last visit 3 mo ago.  On Prednisone 40 mg since 06/11/2014 - taper (30 mg today).  She had foot surgery 04/09/2014. Less active since then.   Last hemoglobin A1c was: Lab Results  Component Value Date   HGBA1C 8.7* 12/10/2013   HGBA1C 7.9* 07/24/2013   HGBA1C 7.2* 03/06/2013  She is on Prednisone 10 mg for sarcoidosis.   [name] on a regimen of: - Metformin 1000 mg po bid >> tolerates it well. She is on a B-complex. - NovoLog - please inject 15 min before a meal - she is only using it once a day - 7 units with a small meal - 8 units with a regular meal - 12 units with a large meal - with dinner - Lantus 23 >> 28 units qhs - pen - may forget She stopped Invokana 300 mg daily - added 01/2014 - had 2 yeast infections (stopped 06/2014) We stopped Glipizide XL 10 mg. We stopped Glyburide 2.5 mg.  Pt checks her sugars 0-1 a day and they are: - am: 130-260 >> 109-262 (higher sugars after Prednisone) >>115-258 (higher range when on Prednisone) >> 200-210 - 2h after b'fast: n/c >> 200, 315 >> n/c - before lunch: n/c >> n/c >> n/c >> 190s - 2h after lunch: n/c >> n/c >> 414 >> n/c - before dinner: n/c >> 230 >> n/c >> 190s - 2h after dinner: n/c >> 245 >> 229, 247 >> n/c - bedtime: 200s >> 241-462 (mostly 200s) >> 199-395 >> 190s - nighttime: n/c No lows. Lowest sugar was upper 100s; she has hypoglycemia awareness at 70s.  Highest sugar was 500s (she started Prednisone 3 days ago).   Meter: Freestyle Freedom Lite. (last night)   She had a nutrition class >> helped.   Pt's meals are: - Breakfast: egg + bacon or if go out: bisquit - Lunch: sandwich - Dinner: meat + veggies + starch - Snacks: pork skins and cookies - 1-2 a day; also  after dinner No exercise.  - no CKD, last BUN/creatinine:  Lab Results  Component Value Date   BUN 13.9 04/17/2014   CREATININE 0.8 04/17/2014  On Losartan. - last set of lipids: Lab Results  Component Value Date   CHOL 211* 12/10/2013   HDL 44.80 12/10/2013   LDLCALC UNABLE TO CALCULATE IF TRIGLYCERIDE OVER 400 mg/dL 05/28/2011   LDLDIRECT 117.9 12/10/2013   TRIG 508.0* 12/10/2013   CHOLHDL 5 12/10/2013  On Lipitor. - last eye exam was in 05/20/2014 - sees eye dr once a year. No DR.  - + numbness and tingling in her feet and legs hurt. She has restless legs.   She also has gout, HL, HTN, sarcoidosis of lung, GERD, COPD, OSA >> resolved.  ROS: Constitutional: no weight gain/loss, no fatigue, no subjective hyperthermia/hypothermia, + nocturia x 2-3 Eyes: no blurry vision, no xerophthalmia ENT: no sore throat, no nodules palpated in throat, no dysphagia/odynophagia, no hoarseness Cardiovascular: no CP/SOB/palpitations/+ periankle swelling Respiratory: no cough/no SOB Gastrointestinal: no N/V/D/C, + heartburn Musculoskeletal: no muscle/joint aches Skin: no rashes Neurological: no tremors/+ numbness - feet/tingling/dizziness  PE: BP 122/68  Pulse 87  Temp(Src) 98.2 F (36.8 C) (Oral)  Resp 12  Wt 262 lb 9.6 oz (119.115 kg)  SpO2 97%  Wt Readings from Last 3 Encounters:  07/01/14 262 lb 9.6 oz (119.115 kg)  06/27/14 265 lb (120.203 kg)  04/26/14 262 lb (118.842 kg)   Constitutional: obese, in NAD Eyes: PERRLA, EOMI, no exophthalmos ENT: moist mucous membranes, no thyromegaly, no cervical lymphadenopathy Cardiovascular: RRR, No MRG Respiratory: CTA B Gastrointestinal: abdomen soft, NT, ND, BS+ Musculoskeletal: no deformities, strength intact in all 4 Skin: dry, warm, no rashes Neurological: + mild tremor with outstretched hands  ASSESSMENT: 1. DM2, insulin-dependent, uncontrolled, with complications - CHF stage 2 - peripheral neuropathy  2. Diabetic peripheral  neuropathy  PLAN:  1. Patient with long-standing, recently more uncontrolled diabetes, on oral antidiabetic regimen + Basal insulin + once a day mealtime insulin (despite advice to take this tid). Sugars increased last night to 500s after being off Invokana, forgetting the Lantus, not taking mealtime insulin and starting prednisone. - I suggested to:  Patient Instructions  Please continue: - Metformin 1000 mg 2x a day - Invokana 300 mg daily Increase Lantus to 35 units at bedtime Add NovoLog - please inject 15 min before every meal: - 8 units with a small meal - 10 units with a regular meal - 12 units with a large meal   Start Neurontin 100 mg at bedtime >> can increase to 2, then 3 tablets if pain in legs is still present.  Please return in 1.5 month with your sugar log.   Please stop at the lab.  - continue checking sugars at different times of the day - check 3 times a day, rotating checks - she is up to date with yearly eye exams - will check HbA1c today - advised to keep well hydrated - Return to clinic in 1.5 mo with sugar log   2. Diabetic PN - start Neurontin 100 mg >> increase to 300 mg at bedtime as tolerated - advised about sedation after taking the dose

## 2014-07-01 NOTE — Patient Instructions (Signed)
Please continue: - Metformin 1000 mg 2x a day - Invokana 300 mg daily Increase Lantus to 35 units at bedtime Add NovoLog - please inject 15 min before every meal: - 8 units with a small meal - 10 units with a regular meal - 12 units with a large meal   Start Neurontin 100 mg at bedtime >> can increase to 2, then 3 tablets if pain in legs is still present.  Please return in 1.5 month with your sugar log.   Please stop at the lab.

## 2014-07-01 NOTE — Telephone Encounter (Signed)
Please read note below and advise.  

## 2014-07-01 NOTE — Telephone Encounter (Signed)
Patient would like to know if Dr. Cruzita Lederer wanted her to continue or discontinue her Invokana  She claims she has been suffering from yeast infections    Please advise patient    Thank you

## 2014-07-02 NOTE — Telephone Encounter (Signed)
Called pt and lvm advising her per Dr Gherghe's note. Advised pt to call back with any questions.  

## 2014-07-04 ENCOUNTER — Other Ambulatory Visit: Payer: Self-pay | Admitting: *Deleted

## 2014-07-04 ENCOUNTER — Telehealth: Payer: Self-pay | Admitting: Internal Medicine

## 2014-07-04 MED ORDER — INSULIN GLARGINE 100 UNIT/ML SOLOSTAR PEN: 35.0000 [IU] | PEN_INJECTOR | Freq: Every day | SUBCUTANEOUS | Status: DC

## 2014-07-04 NOTE — Telephone Encounter (Signed)
Pt needs new dosage of lantus called into the pharmacy for her please

## 2014-07-15 ENCOUNTER — Ambulatory Visit (INDEPENDENT_AMBULATORY_CARE_PROVIDER_SITE_OTHER): Payer: BC Managed Care – PPO | Admitting: Family Medicine

## 2014-07-15 ENCOUNTER — Encounter: Payer: Self-pay | Admitting: Family Medicine

## 2014-07-15 VITALS — BP 112/60 | HR 80 | Temp 98.2°F | Wt 266.0 lb

## 2014-07-15 DIAGNOSIS — R232 Flushing: Secondary | ICD-10-CM | POA: Insufficient documentation

## 2014-07-15 DIAGNOSIS — I10 Essential (primary) hypertension: Secondary | ICD-10-CM

## 2014-07-15 DIAGNOSIS — I5032 Chronic diastolic (congestive) heart failure: Secondary | ICD-10-CM

## 2014-07-15 DIAGNOSIS — Z23 Encounter for immunization: Secondary | ICD-10-CM

## 2014-07-15 DIAGNOSIS — E785 Hyperlipidemia, unspecified: Secondary | ICD-10-CM

## 2014-07-15 MED ORDER — HYDROCOD POLST-CHLORPHEN POLST 10-8 MG/5ML PO LQCR: 5.0000 mL | Freq: Two times a day (BID) | ORAL | Status: DC | PRN

## 2014-07-15 NOTE — Assessment & Plan Note (Signed)
Appears stable. Does have 2+ edema but chronic per patient-likely venous stasis element-can elevated or use compression. Continue lasix 40mg .

## 2014-07-15 NOTE — Assessment & Plan Note (Signed)
Well controlled on losartan, carvedilol, and diltiazem, carvedilol, losartan. Continue.

## 2014-07-15 NOTE — Progress Notes (Signed)
[name] Reddish, MD Phone: [phone]  Subjective:  Patient presents today to establish care with me as their new primary care provider. Patient was formerly a patient of Dr. Leanne Chang. Chief complaint-noted.   Diastolic CHF-stable Breathing stable at baseline. Taking lasix and tolerating well. Weight near baseline. Edema has been present for some time. Not elevating or using compression.  ROS-no orthopnea/pnd  Hypertension-well controlled  BP Readings from Last 3 Encounters:  07/15/14 112/60  07/01/14 122/68  06/27/14 140/80  Compliant with medications-yes without side effects ROS-Denies any CP, HA,  blurry vision, worsening LE edema  Hyperlipidemia-mild poor control ? Compliance. Has atorvastatin 24m and LDL 1 year ago 51.6 but 117.9 7 months ago. Discussed importance of compliance and can recheck at next visit.  ROS- no chest pain or shortness of breath. No myalgias  The following were reviewed and entered/updated in epic: Past Medical History  Diagnosis Date  . DIABETES MELLITUS, TYPE II 08/21/2006  . HYPERLIPIDEMIA 08/21/2006  . GOUT 08/20/2010  . OBESITY 06/04/2009  . ANEMIA-UNSPECIFIED 09/18/2009  . HYPERTENSION 08/21/2006  . GERD 08/21/2006  . SLEEP APNEA 04/21/2009    last testing was negative  . Internal hemorrhoids   . Pulmonary sarcoidosis     Follow[facility] locally by pulmonology, but [facility]  . Exertional chest pain     sharp, substernal, exertional  . Vocal cord dysfunction   . Chronic diastolic heart failure, NYHA class 2     LVEDP roughly 20% by cath  . CHF (congestive heart failure)   . COPD (chronic obstructive pulmonary disease)   . Depression   . Fracture of 5th metatarsal     non union  . Abnormal SPEP 04/17/2014   Patient Active Problem List   Diagnosis Date Noted  . Obstructive chronic bronchitis without exacerbation 09/18/2013    Priority: High  . Chest pain - at rest, and with exertion; -- angiographically  normal coronary arteries, mildly elevated LVEDP 04/11/2013    Priority: High  . Chronic diastolic heart failure, NYHA class 2     Priority: High  . Sarcoidosis of lung 04/10/2007    Priority: High  . Type II diabetes mellitus with neurological manifestations, uncontrolled 08/21/2006    Priority: High  . Depression     Priority: Medium  . Gout 08/20/2010    Priority: Medium  . Anemia 09/18/2009    Priority: Medium  . Hyperlipidemia 08/21/2006    Priority: Medium  . Essential hypertension 08/21/2006    Priority: Medium  . Hot flashes 07/15/2014    Priority: Low  . Abnormal SPEP 04/17/2014    Priority: Low  . Fracture of left leg 04/17/2014    Priority: Low  . Cushingoid side effect of steroids 04/17/2014    Priority: Low  . Internal hemorrhoids     Priority: Low  . Preoperative clearance 03/25/2014    Priority: Low  . Other and unspecified coagulation defects 04/22/2013    Priority: Low  . Solitary pulmonary nodule, on CT 02/2013 - stable over 2 years in 2015 02/20/2013    Priority: Low  . Chronic cough 12/30/2011    Priority: Low  . Obesity 06/04/2009    Priority: Low  . GERD 08/21/2006    Priority: Low   Past Surgical History  Procedure Laterality Date  . Ventral hernia repair    . Nissen fundoplication  27494 . Cholecystectomy  1984  . Abdominal hysterectomy    . Knee arthroscopy  right  . Tubal ligation      with reversal in 1994  . Bladder suspension  11/11/2011    Procedure: TRANSVAGINAL TAPE (TVT) PROCEDURE;  Surgeon: Olga Millers, MD;  Location: [facility];  Service: Gynecology;  Laterality: N/A;  . Cystoscopy  11/11/2011    Procedure: CYSTOSCOPY;  Surgeon: Olga Millers, MD;  Location: [facility];  Service: Gynecology;  Laterality: N/A;  . Doppler echocardiography  02/12/2013    LV FUNCTION, SIZE NORMAL; MILD CONCENTRIC LVH; EST EF 55-65%; WALL MOTION NORMAL  . Lexiscan myoview  03/09/2013    EF 50%; NORMAL MYOCARDIAL PERFUSION STUDY - breast attenuation    . Cardiac catheterization  07/2010    LVEF 50-55% WITH VERY MILD GLOBAL HYPOKINESIA; ESSENTIALLY NORMAL CORONARY ARTERIES; NORMAL LV FUNCTION  . Carotids  02/18/11    CAROTID DUPLEX; VERTEBRALS ARE PATENT WITH ANTEGRADE FLOW. ICA/CCA RATIO 1.61 ON RIGHT AND 0.75 ON LEFT  . Right and left cardiac catheterization  04/23/2013    Angiographic normal coronaries; LVEDP 20 mmHg, PCWP 12-14 mmHg, RAP 12 mmHg.; Fick CO/CI 4.9/2.2  . Metatarsal osteotomy with open reduction internal fixation (orif) metatarsal with fusion Left 04/09/2014    Procedure: LEFT FOOT FRACTURE OPEN TREATMENT METATARSAL INCLUDES INTERNAL FIXATION EACH;  Surgeon: Lorn Junes, MD;  Location: [facility];  Service: Orthopedics;  Laterality: Left;    Family History  Problem Relation Age of Onset  . Diabetes Father   . Heart attack Father   . Coronary artery disease Father   . Heart failure Father   . COPD Mother   . Emphysema Mother   . Asthma Mother   . Heart failure Mother   . Sarcoidosis Maternal Uncle   . Colon cancer Neg Hx   . Lung cancer Brother   . Cancer Brother   . Diabetes Brother   . Heart attack Maternal Grandfather     Medications- reviewed and updated Current Outpatient Prescriptions  Medication Sig Dispense Refill  . allopurinol (ZYLOPRIM) 100 MG tablet Take 100 mg by mouth at bedtime.       [name] aspirin EC 81 MG tablet Take 81 mg by mouth daily.       [name] atorvastatin (LIPITOR) 40 MG tablet Take one tablet by mouth one time daily  90 tablet  3  . azithromycin (ZITHROMAX) 250 MG tablet Take 250 mg by mouth 3 (three) times a week.      . benzonatate (TESSALON) 200 MG capsule Take 200 mg by mouth.      . Blood Glucose Monitoring Suppl (FREESTYLE FREEDOM LITE) W/DEVICE KIT Use as adviced  1 each  0  . buPROPion (WELLBUTRIN XL) 300 MG 24 hr tablet TAKE ONE TABLET BY MOUTH ONE TIME DAILY   30 tablet  10  . CARTIA XT 120 MG 24 hr capsule TAKE ONE CAPSULE BY MOUTH ONCE DAILY IN THE EVENING   30  capsule  2  . carvedilol (COREG) 25 MG tablet Take 1 tablet (25 mg total) by mouth 2 (two) times daily with a meal.  180 tablet  3  . cholecalciferol 5000 UNITS TABS Take 1 tablet (5,000 Units total) by mouth daily.  30 tablet  3  . DEXILANT 60 MG capsule TAKE ONE CAPSULE BY MOUTH ONE TIME DAILY   30 capsule  6  . estradiol (ESTRACE) 2 MG tablet Take by mouth daily.       . fenofibrate (TRICOR) 145 MG tablet Take one tablet by mouth one  time daily  30 tablet  10  . furosemide (LASIX) 40 MG tablet Take one tablet by mouth twice daily      . gabapentin (NEURONTIN) 100 MG capsule Take 1 capsule (100 mg total) by mouth 3 (three) times daily.  90 capsule  3  . glucose blood (ONE TOUCH ULTRA TEST) test strip 1 each by Other route 3 (three) times daily. Use as instructed  100 each  5  . insulin aspart (NOVOLOG FLEXPEN) 100 UNIT/ML FlexPen Inject 8-12 Units into the skin 3 (three) times daily with meals. Pens please.  15 mL  11  . Insulin Glargine (LANTUS) 100 UNIT/ML Solostar Pen Inject 35 Units into the skin daily at 10 pm.  15 mL  3  . LORazepam (ATIVAN) 0.5 MG tablet Take 1 tablet (0.5 mg total) by mouth 2 (two) times daily as needed for anxiety.  30 tablet  1  . losartan (COZAAR) 100 MG tablet Take one tablet by mouth one time daily  90 tablet  1  . metFORMIN (GLUCOPHAGE) 1000 MG tablet Take 1 tablet (1,000 mg total) by mouth 2 (two) times daily with a meal.  180 tablet  3  . mometasone-formoterol (DULERA) 200-5 MCG/ACT AERO Inhale 2 puffs into the lungs 2 (two) times daily.  1 Inhaler  6  . omeprazole (PRILOSEC) 20 MG capsule Take 1 capsule (20 mg total) by mouth 2 (two) times daily before a meal.  90 capsule  2  . PARoxetine Mesylate (BRISDELLE) 7.5 MG CAPS Take 7.5 mg by mouth daily.      . potassium chloride SA (K-DUR,KLOR-CON) 20 MEQ tablet TAKE ONE TABLET BY MOUTH THREE TIMES DAILY   90 tablet  3  . predniSONE (DELTASONE) 1 MG tablet Take 4 mg by mouth daily with breakfast.       . predniSONE  (DELTASONE) 5 MG tablet Take 5 mg by mouth daily with breakfast. Sig 54ms daily      . sucralfate (CARAFATE) 1 GM/10ML suspension Take 10 mLs (1 g total) by mouth 4 (four) times daily -  with meals and at bedtime.  420 mL  0  . venlafaxine XR (EFFEXOR-XR) 75 MG 24 hr capsule TAKE ONE CAPSULE BY MOUTH TWICE DAILY   60 capsule  11  . albuterol (PROVENTIL HFA;VENTOLIN HFA) 108 (90 BASE) MCG/ACT inhaler Inhale 2 puffs into the lungs every 6 (six) hours as needed. For wheezing.      .Marland Kitchenalbuterol (PROVENTIL) (5 MG/ML) 0.5% nebulizer solution Take 2.5 mg by nebulization every 6 (six) hours as needed. For wheezing.      . B-D ULTRAFINE III SHORT PEN 31G X 8 MM MISC Follow directions provided by physician  30 each  2  . chlorpheniramine-HYDROcodone (TUSSIONEX) 10-8 MG/5ML LQCR Take 5 mLs by mouth every 12 (twelve) hours as needed for cough.  480 mL  0  . nitroGLYCERIN (NITROSTAT) 0.4 MG SL tablet Place 0.4 mg under the tongue every 5 (five) minutes as needed. x3 doses as needed for chest pain.       No current facility-administered medications for this visit.    Allergies-reviewed and updated Allergies  Allergen Reactions  . Lisinopril Cough  . Methotrexate     REACTION: peri-oral and buccal lesions.  . Clindamycin/Lincomycin Nausea And Vomiting and Rash    History   Social History  . Marital Status: Married    Spouse Name: WJEFF MCCALLUM   Number of Children: 2  . Years of Education: 176  Occupational History  . DISABLED    Social History Main Topics  . Smoking status: Never Smoker   . Smokeless tobacco: Never Used  . Alcohol Use: No  . Drug Use: No  . Sexual Activity: Yes    Birth Control/ Protection: Surgical   Other Topics Concern  . None   Social History Narrative   Married 1994. 2 sons who both live close and 1 grandson.       Disability due to sarcoidosis. 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.       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  CV: RRR no murmurs rubs or gallops Lungs: CTAB no crackles, wheeze, rhonchi Ext: 2+ edema, 2+ radial pulses, Skin: warm, dry, striae on abdomen Neuro: grossly normal, moves all extremities, PERRLA   Assessment/Plan:  Hyperlipidemia LDL above goal 100. Stressed importance of compliance. REcheck 3 months.   Essential hypertension Well controlled on losartan, carvedilol, and diltiazem, carvedilol, losartan. Continue.   Chronic diastolic heart failure, NYHA class 2 Appears stable. Does have 2+ edema but chronic per patient-likely venous stasis element-can elevated or use compression. Continue lasix 20m.    Uses with flares of coughing with sarcoidosis, pulmonary Meds ordered this encounter  Medications  . chlorpheniramine-HYDROcodone (TUSSIONEX) 10-8 MG/5ML LQCR    Sig: Take 5 mLs by mouth every 12 (twelve) hours as needed for cough.    Dispense:  480 mL    Refill:  0

## 2014-07-15 NOTE — Patient Instructions (Signed)
Great to meet you and get to know you better!   No changes today.   Flu shot today.   Refilled tussionex.   Every 3-6 months,  Dr. Yong Channel  Health Maintenance Due  Topic Date Due  . Pneumococcal Polysaccharide Vaccine (##2) 09/30/2013  . Foot Exam - next visit 03/21/2014  . Influenza Vaccine - today 05/11/2014

## 2014-07-15 NOTE — Assessment & Plan Note (Signed)
LDL above goal 100. Stressed importance of compliance. REcheck 3 months.

## 2014-07-16 ENCOUNTER — Other Ambulatory Visit: Payer: Self-pay | Admitting: Obstetrics and Gynecology

## 2014-07-16 DIAGNOSIS — Z1231 Encounter for screening mammogram for malignant neoplasm of breast: Secondary | ICD-10-CM | POA: Diagnosis not present

## 2014-07-16 DIAGNOSIS — Z124 Encounter for screening for malignant neoplasm of cervix: Secondary | ICD-10-CM | POA: Diagnosis not present

## 2014-07-16 DIAGNOSIS — Z01419 Encounter for gynecological examination (general) (routine) without abnormal findings: Secondary | ICD-10-CM | POA: Diagnosis not present

## 2014-07-17 LAB — CYTOLOGY - PAP

## 2014-07-25 ENCOUNTER — Other Ambulatory Visit: Payer: Self-pay | Admitting: Internal Medicine

## 2014-07-25 DIAGNOSIS — IMO0002 Reserved for concepts with insufficient information to code with codable children: Secondary | ICD-10-CM

## 2014-07-25 DIAGNOSIS — E1165 Type 2 diabetes mellitus with hyperglycemia: Secondary | ICD-10-CM

## 2014-07-29 IMAGING — CR DG CHEST 1V PORT
1 series · 1 of 1 positions shown · non-contrast
Comparison: Chest radiograph performed 04/11/2013

CLINICAL DATA: Wheezing, shortness of breath and respiratory
distress.

EXAM:
PORTABLE CHEST - 1 VIEW

[AP]
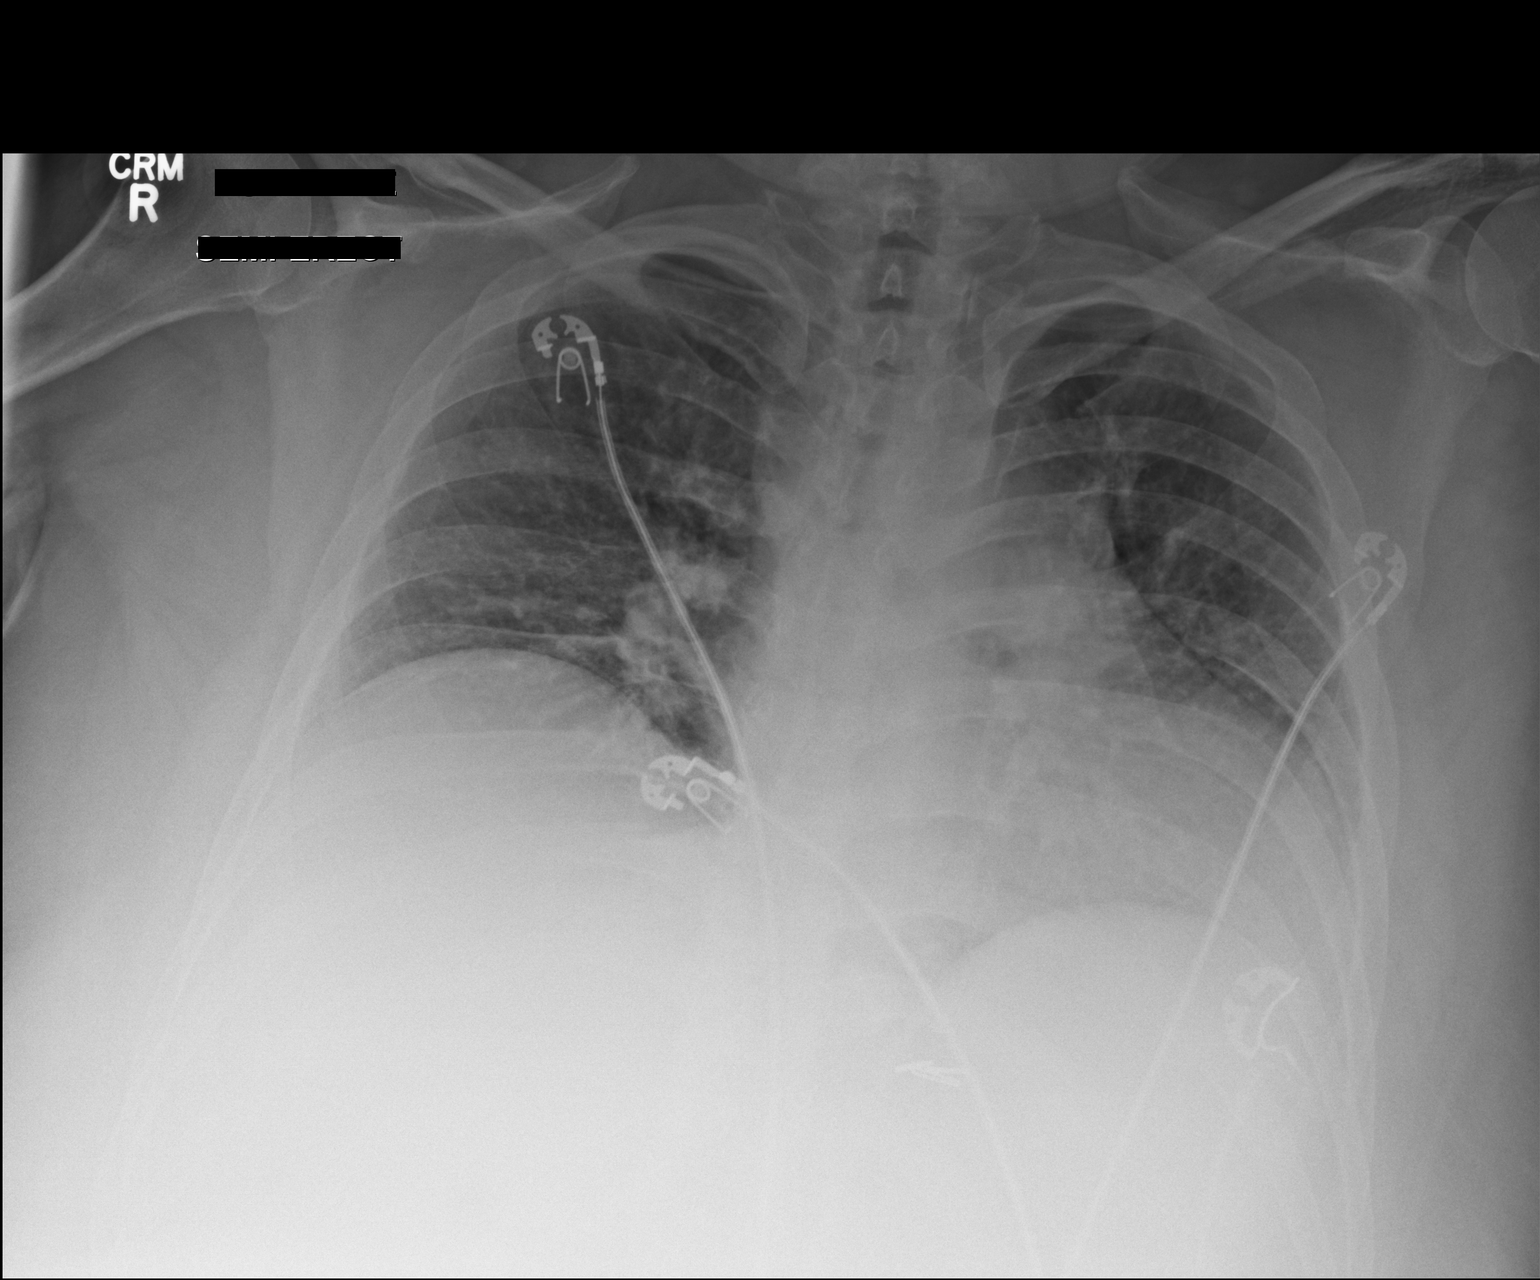

[1 of 1 positions shown; findings below may reference images not displayed]

FINDINGS: The lungs are hypoexpanded. Vascular crowding is seen. Mild
bibasilar opacities likely reflect atelectasis, though mild left
basilar pneumonia cannot be excluded. No pleural effusion or
pneumothorax is seen.

The cardiomediastinal silhouette is borderline normal in size. No
acute osseous abnormalities are seen.
IMPRESSION: Lungs hypoexpanded. Mild bibasilar airspace opacities likely reflect
atelectasis, though mild left basilar pneumonia cannot be excluded.

## 2014-08-05 ENCOUNTER — Ambulatory Visit: Payer: BC Managed Care – PPO | Admitting: Critical Care Medicine

## 2014-08-11 ENCOUNTER — Emergency Department (INDEPENDENT_AMBULATORY_CARE_PROVIDER_SITE_OTHER)
Admission: EM | Admit: 2014-08-11 | Discharge: 2014-08-11 | Disposition: A | Discharge status: Home / Self Care | Payer: BC Managed Care – PPO | Source: Home / Self Care | Attending: Emergency Medicine | Admitting: Emergency Medicine

## 2014-08-11 ENCOUNTER — Encounter (HOSPITAL_COMMUNITY): Payer: Self-pay

## 2014-08-11 DIAGNOSIS — R600 Localized edema: Secondary | ICD-10-CM

## 2014-08-11 HISTORY — PX: TRANSTHORACIC ECHOCARDIOGRAM: SHX275

## 2014-08-11 NOTE — ED Provider Notes (Signed)
CSN: [id number]     Arrival date & time 08/11/14  1558 History   First MD Initiated Contact with Patient 08/11/14 1647     Chief Complaint  Patient presents with  . Skin Problem   (Consider location/radiation/quality/duration/timing/severity/associated sxs/prior Treatment) HPI Comments: Patient reports that she has developed bilateral mild redness and edema of both her ankles over the past 2-3 days. States she has an episode of mild dyspnea last night and she got up and sat in a chair for a while and symptoms improved and she was able to go back to bed. She has not been recording her daily weights at home.  Has multiple chronic medical issues including sarcoidosis, CHF & diastolic hear dysfunction, DM, HTN, sleep apnea.  The history is provided by the patient and the spouse.    Past Medical History  Diagnosis Date  . DIABETES MELLITUS, TYPE II 08/21/2006  . HYPERLIPIDEMIA 08/21/2006  . GOUT 08/20/2010  . OBESITY 06/04/2009  . ANEMIA-UNSPECIFIED 09/18/2009  . HYPERTENSION 08/21/2006  . GERD 08/21/2006  . SLEEP APNEA 04/21/2009    last testing was negative  . Internal hemorrhoids   . Pulmonary sarcoidosis     Followed locally by pulmonology, but also by Dr. Casper Harrison at [facility]  . Exertional chest pain     sharp, substernal, exertional  . Vocal cord dysfunction   . Chronic diastolic heart failure, NYHA class 2     LVEDP roughly 20% by cath  . CHF (congestive heart failure)   . COPD (chronic obstructive pulmonary disease)   . Depression   . Fracture of 5th metatarsal     non union  . Abnormal SPEP 04/17/2014   Past Surgical History  Procedure Laterality Date  . Ventral hernia repair    . Nissen fundoplication  4081  . Cholecystectomy  1984  . Abdominal hysterectomy    . Knee arthroscopy      right  . Tubal ligation      with reversal in 1994  . Bladder suspension  11/11/2011    Procedure: TRANSVAGINAL TAPE (TVT) PROCEDURE;  Surgeon: Olga Millers, MD;   Location: [facility];  Service: Gynecology;  Laterality: N/A;  . Cystoscopy  11/11/2011    Procedure: CYSTOSCOPY;  Surgeon: Olga Millers, MD;  Location: [facility];  Service: Gynecology;  Laterality: N/A;  . Doppler echocardiography  02/12/2013    LV FUNCTION, SIZE NORMAL; MILD CONCENTRIC LVH; EST EF 55-65%; WALL MOTION NORMAL  . Lexiscan myoview  03/09/2013    EF 50%; NORMAL MYOCARDIAL PERFUSION STUDY - breast attenuation  . Cardiac catheterization  07/2010    LVEF 50-55% WITH VERY MILD GLOBAL HYPOKINESIA; ESSENTIALLY NORMAL CORONARY ARTERIES; NORMAL LV FUNCTION  . Carotids  02/18/11    CAROTID DUPLEX; VERTEBRALS ARE PATENT WITH ANTEGRADE FLOW. ICA/CCA RATIO 1.61 ON RIGHT AND 0.75 ON LEFT  . Right and left cardiac catheterization  04/23/2013    Angiographic normal coronaries; LVEDP 20 mmHg, PCWP 12-14 mmHg, RAP 12 mmHg.; Fick CO/CI 4.9/2.2  . Metatarsal osteotomy with open reduction internal fixation (orif) metatarsal with fusion Left 04/09/2014    Procedure: LEFT FOOT FRACTURE OPEN TREATMENT METATARSAL INCLUDES INTERNAL FIXATION EACH;  Surgeon: Lorn Junes, MD;  Location: [facility];  Service: Orthopedics;  Laterality: Left;   Family History  Problem Relation Age of Onset  . Diabetes Father   . Heart attack Father   . Coronary artery disease Father   . Heart failure Father   . COPD  Mother   . Emphysema Mother   . Asthma Mother   . Heart failure Mother   . Sarcoidosis Maternal Uncle   . Colon cancer Neg Hx   . Lung cancer Brother   . Cancer Brother   . Diabetes Brother   . Heart attack Maternal Grandfather    History  Substance Use Topics  . Smoking status: Never Smoker   . Smokeless tobacco: Never Used  . Alcohol Use: No   OB History    No data available     Review of Systems  All other systems reviewed and are negative.   Allergies  Lisinopril; Methotrexate; and Clindamycin/lincomycin  Home Medications   Prior to Admission medications   Medication Sig  Start Date End Date Taking? Authorizing Provider  albuterol (PROVENTIL HFA;VENTOLIN HFA) 108 (90 BASE) MCG/ACT inhaler Inhale 2 puffs into the lungs every 6 (six) hours as needed. For wheezing.    Historical Provider, MD  albuterol (PROVENTIL) (5 MG/ML) 0.5% nebulizer solution Take 2.5 mg by nebulization every 6 (six) hours as needed. For wheezing.    Historical Provider, MD  allopurinol (ZYLOPRIM) 100 MG tablet Take 100 mg by mouth at bedtime.     Historical Provider, MD  aspirin EC 81 MG tablet Take 81 mg by mouth daily.     Historical Provider, MD  atorvastatin (LIPITOR) 40 MG tablet Take one tablet by mouth one time daily    Bruce Kendall Flack, MD  azithromycin (ZITHROMAX) 250 MG tablet Take 250 mg by mouth 3 (three) times a week.    Historical Provider, MD  B-D ULTRAFINE III SHORT PEN 31G X 8 MM MISC Follow directions provided by physician 09/13/13   Lisabeth Pick, MD  benzonatate (TESSALON) 200 MG capsule Take 200 mg by mouth. 09/19/13   Historical Provider, MD  Blood Glucose Monitoring Suppl (FREESTYLE FREEDOM LITE) W/DEVICE KIT Use as adviced 12/27/13   Philemon Kingdom, MD  buPROPion (WELLBUTRIN XL) 300 MG 24 hr tablet TAKE ONE TABLET BY MOUTH ONE TIME DAILY  05/30/14   Lisabeth Pick, MD  CARTIA XT 120 MG 24 hr capsule TAKE ONE CAPSULE BY MOUTH ONCE DAILY IN THE EVENING  06/14/14   Leonie Man, MD  carvedilol (COREG) 25 MG tablet Take 1 tablet (25 mg total) by mouth 2 (two) times daily with a meal. 10/01/13   Leonie Man, MD  chlorpheniramine-HYDROcodone (TUSSIONEX) 10-8 MG/5ML [facility] Take 5 mLs by mouth every 12 (twelve) hours as needed for cough. 07/15/14   [name], MD  cholecalciferol 5000 UNITS TABS Take 1 tablet (5,000 Units total) by mouth daily. 04/09/14   Kirstin J Shepperson, PA-C  DEXILANT 60 MG capsule TAKE ONE CAPSULE BY MOUTH ONE TIME DAILY     Elsie Stain, MD  estradiol (ESTRACE) 2 MG tablet Take by mouth daily.  01/24/14   Historical Provider, MD  fenofibrate  (TRICOR) 145 MG tablet Take one tablet by mouth one time daily 11/04/13   Leonie Man, MD  furosemide (LASIX) 40 MG tablet Take one tablet by mouth twice daily    Leonie Man, MD  gabapentin (NEURONTIN) 100 MG capsule Take 1 capsule (100 mg total) by mouth 3 (three) times daily. 07/01/14   Philemon Kingdom, MD  glucose blood (ONE TOUCH ULTRA TEST) test strip 1 each by Other route 3 (three) times daily. Use as instructed 10/19/13   Lisabeth Pick, MD  insulin aspart (NOVOLOG FLEXPEN) 100 UNIT/ML FlexPen Inject 8-12 Units  into the skin 3 (three) times daily with meals. Pens please. 07/01/14   Philemon Kingdom, MD  Insulin Glargine (LANTUS) 100 UNIT/ML Solostar Pen Inject 35 Units into the skin daily at 10 pm. 07/04/14   Philemon Kingdom, MD  LORazepam (ATIVAN) 0.5 MG tablet Take 1 tablet (0.5 mg total) by mouth 2 (two) times daily as needed for anxiety. 11/29/13   Marletta Lor, MD  losartan (COZAAR) 100 MG tablet Take one tablet by mouth one time daily    Leonie Man, MD  metFORMIN (GLUCOPHAGE) 1000 MG tablet Take 1 tablet (1,000 mg total) by mouth 2 (two) times daily with a meal. 12/27/13   Philemon Kingdom, MD  mometasone-formoterol (DULERA) 200-5 MCG/ACT AERO Inhale 2 puffs into the lungs 2 (two) times daily. 06/15/13   Elsie Stain, MD  nitroGLYCERIN (NITROSTAT) 0.4 MG SL tablet Place 0.4 mg under the tongue every 5 (five) minutes as needed. x3 doses as needed for chest pain.    Historical Provider, MD  omeprazole (PRILOSEC) 20 MG capsule Take 1 capsule (20 mg total) by mouth 2 (two) times daily before a meal. 05/14/14   Elsie Stain, MD  PARoxetine Mesylate (BRISDELLE) 7.5 MG CAPS Take 7.5 mg by mouth daily.    Historical Provider, MD  potassium chloride SA (K-DUR,KLOR-CON) 20 MEQ tablet TAKE ONE TABLET BY MOUTH THREE TIMES DAILY     Lisabeth Pick, MD  predniSONE (DELTASONE) 1 MG tablet Take 4 mg by mouth daily with breakfast.  06/11/14   Historical Provider, MD  predniSONE  (DELTASONE) 5 MG tablet Take 5 mg by mouth daily with breakfast. Sig 38ms daily 06/11/14   Historical Provider, MD  sucralfate (CARAFATE) 1 GM/10ML suspension Take 10 mLs (1 g total) by mouth 4 (four) times daily -  with meals and at bedtime. 11/29/13   April K Palumbo-Rasch, MD  venlafaxine XR (EFFEXOR-XR) 75 MG 24 hr capsule TAKE ONE CAPSULE BY MOUTH TWICE DAILY     Bruce HLemmie EvensSwords, MD   BP 132/77 mmHg  Pulse 86  Temp(Src) 98.3 F (36.8 C) (Oral)  Resp 18  Wt 266 lb (120.657 kg)  SpO2 96% Physical Exam  Constitutional: She is oriented to person, place, and time. She appears well-developed and well-nourished. No distress.  HENT:  Head: Normocephalic and atraumatic.  Eyes: Conjunctivae are normal. No scleral icterus.  Neck: Normal range of motion. Neck supple. No JVD present.  Cardiovascular: Normal rate, regular rhythm and normal heart sounds.   Pulmonary/Chest: Effort normal and breath sounds normal. No tachypnea. She has no decreased breath sounds. She has no wheezes. She has no rhonchi. She has no rales.  Musculoskeletal: She exhibits edema and tenderness.  +mild tenderness at both ankles with mild erythema and no induration. 1-2+ pitting edema to proximal ankles bilaterally.   Neurological: She is alert and oriented to person, place, and time.  Skin: Skin is warm and dry.  Psychiatric: She has a normal mood and affect. Her behavior is normal.  Nursing note and vitals reviewed.   ED Course  Procedures (including critical care time) Labs Review Labs Reviewed - No data to display  Imaging Review No results found.   MDM   1. Pedal edema    Contacted [facility] cardiologist on call and case discussed. Recommended that patient begin recording daily weights. Increase morning dose of Lasix to 80 mg a day and continue with afternoon dose of 40 mg daily for the next 2 days and then return to  Lasix 45m BID. Also recommended that patient increase morning potassium replacement to 456mQAm x 2  days and return to usual dosing schedule after 2 days. Lastly, advised to contact her cardiologist at office tomorrow (Dr. HaEllyn Hackto discuss symptoms and treatment plan along with follow up evaluation. Patient and husband voice understanding that if symptoms become suddenly worse or severe, she is to report to her nearest ER for assistance.   JeLutricia FeilPAUtah1/01/15 [phone]

## 2014-08-11 NOTE — ED Notes (Signed)
Patient c/o both lower legs are red , left > right ; both legs hurt left >right . Recent surgery on left foot. Getting ready to go out of town , and want to be sure everything is okay

## 2014-08-11 NOTE — Discharge Instructions (Signed)
I have spoken to the cardiologist on call for Dr. Ellyn Hack and it was recommended that you take Lasix 80mg  in the morning and Lasix 40mg  in the afternoon for the next 2 days. Then you can return to taking Lasix 40 mg twice a day. It was also recommended that you take 40 meq of potassium in the mornings for the next two day and then return to your usual 67meq dose. I would also ask that you call Dr. Allison Quarry office tomorrow to discuss symptoms and recommended medication changes. If symptoms become suddenly worse or severe, please have yourself re-evaluated at your nearest ER.   Peripheral Edema You have swelling in your legs (peripheral edema). This swelling is due to excess accumulation of salt and water in your body. Edema may be a sign of heart, kidney or liver disease, or a side effect of a medication. It may also be due to problems in the leg veins. Elevating your legs and using special support stockings may be very helpful, if the cause of the swelling is due to poor venous circulation. Avoid long periods of standing, whatever the cause. Treatment of edema depends on identifying the cause. Chips, pretzels, pickles and other salty foods should be avoided. Restricting salt in your diet is almost always needed. Water pills (diuretics) are often used to remove the excess salt and water from your body via urine. These medicines prevent the kidney from reabsorbing sodium. This increases urine flow. Diuretic treatment may also result in lowering of potassium levels in your body. Potassium supplements may be needed if you have to use diuretics daily. Daily weights can help you keep track of your progress in clearing your edema. You should call your caregiver for follow up care as recommended. SEEK IMMEDIATE MEDICAL CARE IF:   You have increased swelling, pain, redness, or heat in your legs.  You develop shortness of breath, especially when lying down.  You develop chest or abdominal pain, weakness, or  fainting.  You have a fever. Document Released: 11/04/2004 Document Revised: 12/20/2011 Document Reviewed: 10/15/2009 [facility] Patient Information 2015 [place], [place]. This information is not intended to replace advice given to you by your health care provider. Make sure you discuss any questions you have with your health care provider.

## 2014-08-12 ENCOUNTER — Ambulatory Visit: Payer: BC Managed Care – PPO | Admitting: Internal Medicine

## 2014-08-12 IMAGING — CR DG CHEST 2V
2 series · 2 of 2 positions shown · non-contrast
Comparison: September 20, 2013.

CLINICAL DATA: History of sarcoidosis cough and dyspnea

EXAM:
CHEST  2 VIEW

[view not recorded (1 of 2)]
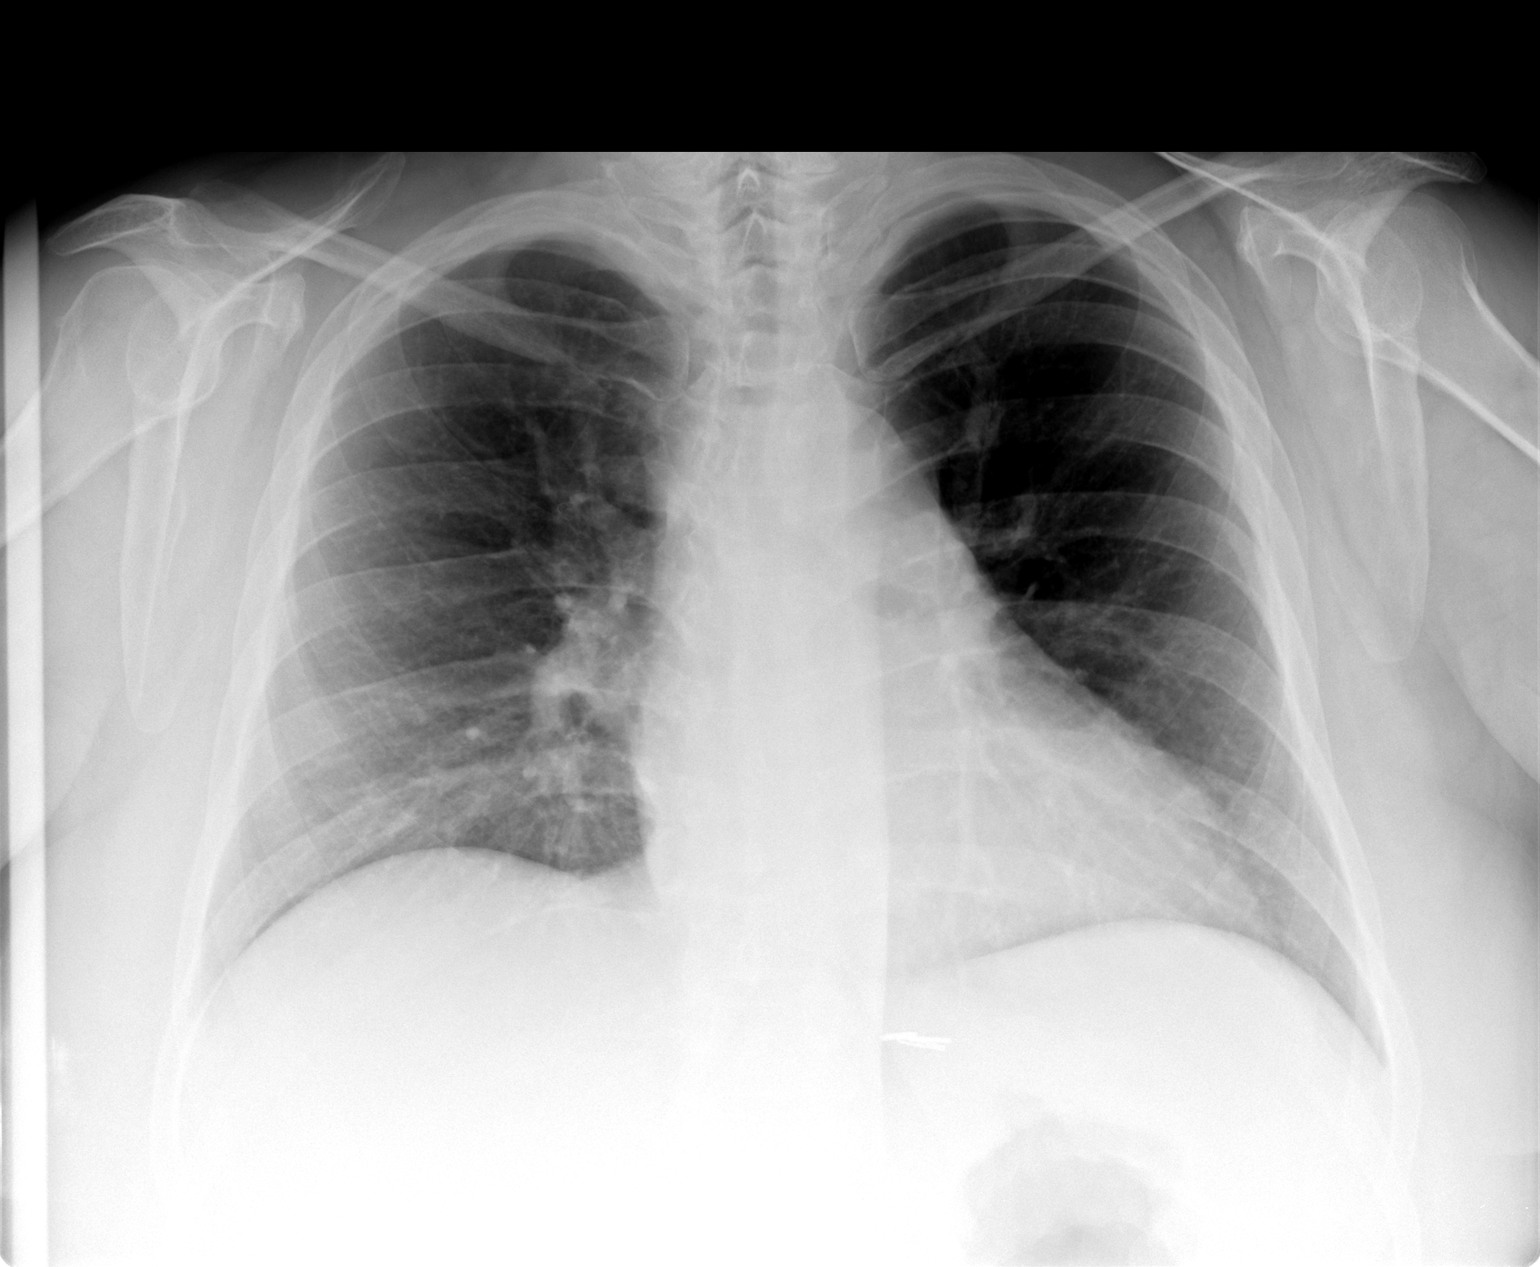

[view not recorded (2 of 2)]
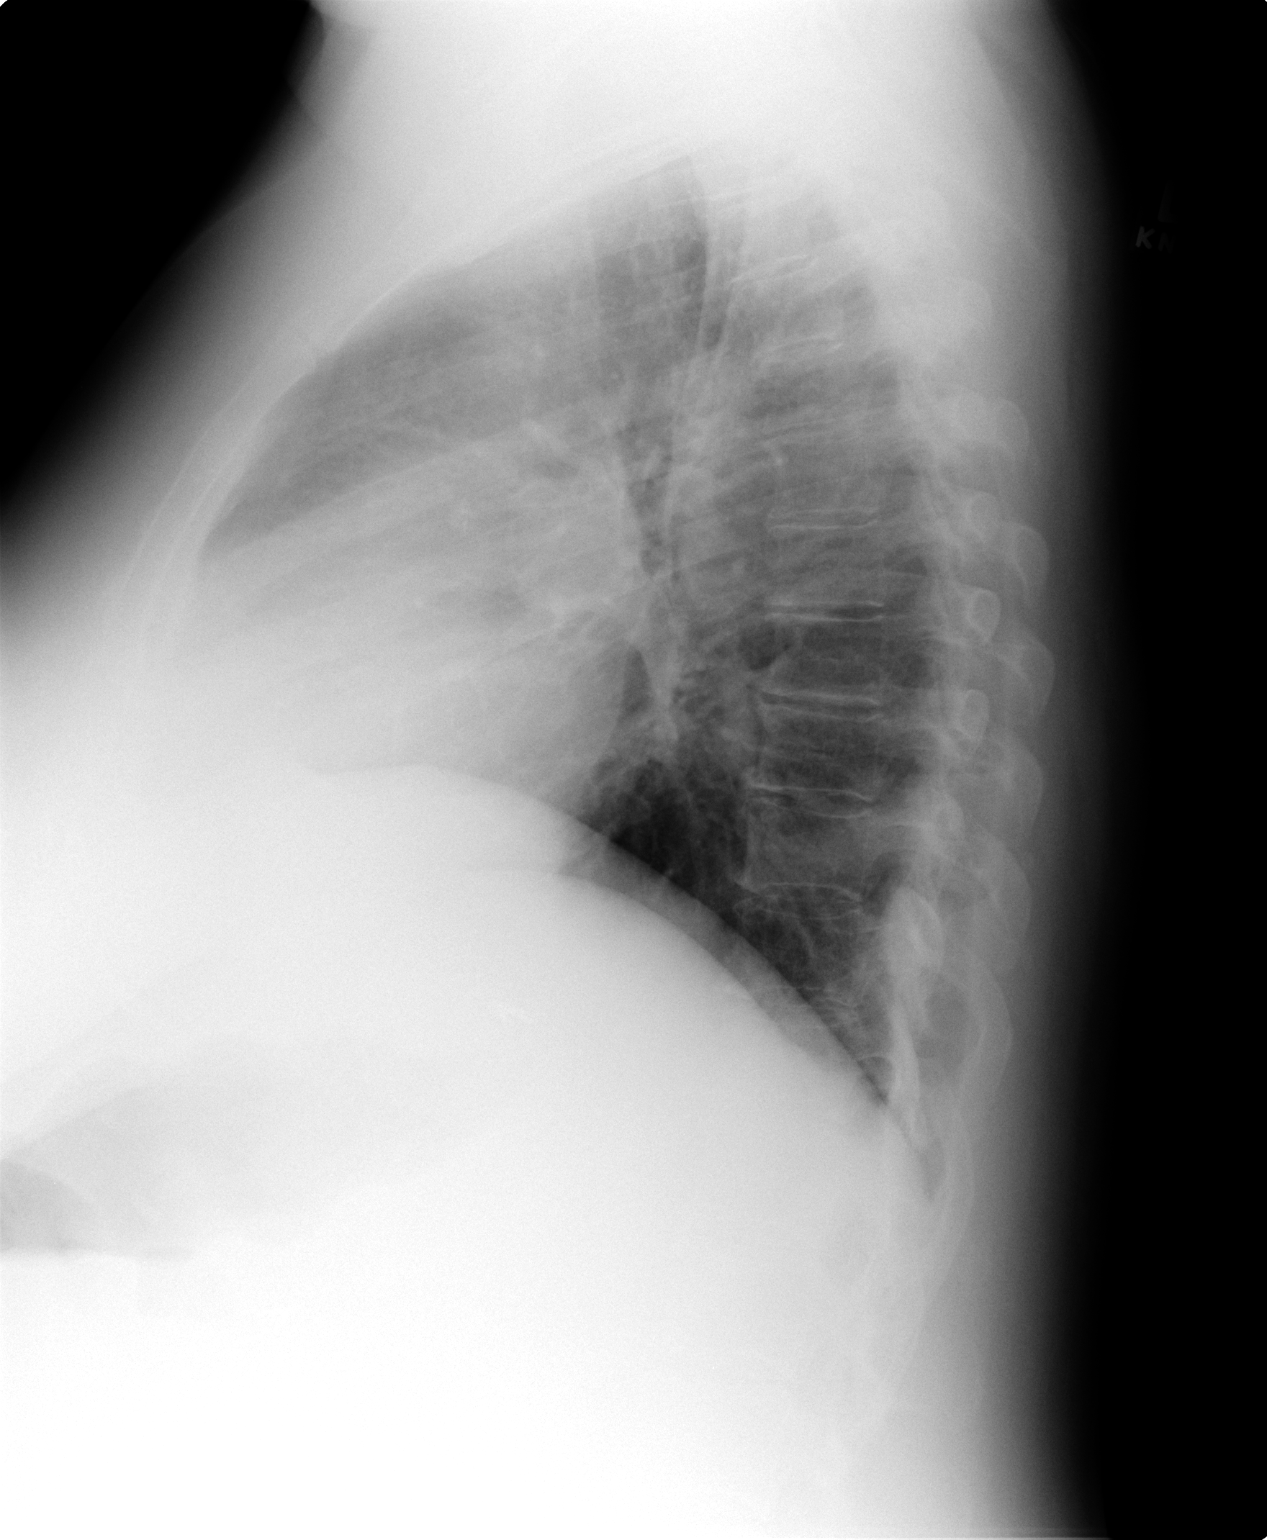

[2 of 2 positions shown; findings below may reference images not displayed]

FINDINGS: The lungs are reasonably well inflated and clear. The previously
demonstrated minimal interstitial prominence has resolved. There is
no pleural effusion. There are no parenchymal mass is demonstrated.
The cardiopericardial silhouette is normal in size. The pulmonary
vascularity is not engorged. The observed portions of the bony
thorax appear normal.
IMPRESSION: There is no evidence of acute cardiopulmonary abnormality.

## 2014-08-20 ENCOUNTER — Ambulatory Visit: Payer: BC Managed Care – PPO | Admitting: Internal Medicine

## 2014-08-21 ENCOUNTER — Other Ambulatory Visit (INDEPENDENT_AMBULATORY_CARE_PROVIDER_SITE_OTHER): Payer: BC Managed Care – PPO

## 2014-08-21 ENCOUNTER — Ambulatory Visit: Payer: BC Managed Care – PPO | Admitting: Critical Care Medicine

## 2014-08-21 ENCOUNTER — Ambulatory Visit (INDEPENDENT_AMBULATORY_CARE_PROVIDER_SITE_OTHER): Payer: BC Managed Care – PPO | Admitting: Gastroenterology

## 2014-08-21 ENCOUNTER — Encounter: Payer: Self-pay | Admitting: Gastroenterology

## 2014-08-21 VITALS — BP 148/70 | HR 84 | Ht 66.0 in | Wt 264.2 lb

## 2014-08-21 DIAGNOSIS — R110 Nausea: Secondary | ICD-10-CM

## 2014-08-21 DIAGNOSIS — R109 Unspecified abdominal pain: Secondary | ICD-10-CM

## 2014-08-21 LAB — COMPREHENSIVE METABOLIC PANEL
ALT: 11 U/L (ref 0–35)
AST: 16 U/L (ref 0–37)
Albumin: 3.2 g/dL — ABNORMAL LOW (ref 3.5–5.2)
Alkaline Phosphatase: 83 U/L (ref 39–117)
BUN: 15 mg/dL (ref 6–23)
CO2: 28 mEq/L (ref 19–32)
Calcium: 9.2 mg/dL (ref 8.4–10.5)
Chloride: 98 mEq/L (ref 96–112)
Creatinine, Ser: 0.7 mg/dL (ref 0.4–1.2)
GFR: 87.95 mL/min (ref >60.00)
Glucose, Bld: 326 mg/dL — ABNORMAL HIGH (ref 70–99)
Potassium: 4.9 mEq/L (ref 3.5–5.1)
Sodium: 135 mEq/L (ref 135–145)
Total Bilirubin: 0.6 mg/dL (ref 0.2–1.2)
Total Protein: 6.8 g/dL (ref 6.0–8.3)

## 2014-08-21 LAB — CBC WITH DIFFERENTIAL/PLATELET
Basophils Absolute: 0 10*3/uL (ref 0.0–0.1)
Basophils Relative: 0.1 % (ref 0.0–3.0)
Eosinophils Absolute: 0.2 10*3/uL (ref 0.0–0.7)
Eosinophils Relative: 1.3 % (ref 0.0–5.0)
HCT: 36.8 % (ref 36.0–46.0)
Hemoglobin: 11.9 g/dL — ABNORMAL LOW (ref 12.0–15.0)
Lymphocytes Relative: 12.1 % (ref 12.0–46.0)
Lymphs Abs: 2.1 10*3/uL (ref 0.7–4.0)
MCHC: 32.3 g/dL (ref 30.0–36.0)
MCV: 88.1 fl (ref 78.0–100.0)
Monocytes Absolute: 0.6 10*3/uL (ref 0.1–1.0)
Monocytes Relative: 3.7 % (ref 3.0–12.0)
Neutro Abs: 14.2 10*3/uL — ABNORMAL HIGH (ref 1.4–7.7)
Neutrophils Relative %: 82.8 % — ABNORMAL HIGH (ref 43.0–77.0)
Platelets: 399 10*3/uL (ref 150.0–400.0)
RBC: 4.18 Mil/uL (ref 3.87–5.11)
RDW: 14.3 % (ref 11.5–15.5)
WBC: 17.2 10*3/uL — ABNORMAL HIGH (ref 4.0–10.5)

## 2014-08-21 LAB — LIPASE: Lipase: 27 U/L (ref 11.0–59.0)

## 2014-08-21 MED ORDER — PROMETHAZINE HCL 12.5 MG PO TABS: 12.5000 mg | ORAL_TABLET | Freq: Four times a day (QID) | ORAL | Status: DC | PRN

## 2014-08-21 NOTE — Patient Instructions (Addendum)
Your physician has requested that you go to the basement for the following lab work before leaving today: CBC CMET Lipase  We have sent the following medications to your pharmacy for you to pick up at your convenience: Phenergan 12.5, please take one tablet by mouth every six hours as needed for nausea  ________________________________________________________________________________________________________________________________________________________________________________________  You have been scheduled for a CT scan of the abdomen and pelvis at [facility]. Please arrive 15 minutes prior to your appointment for registration.  Should you need to reschedule your appointment, please contact radiology at [phone]. This test typically takes about 30 minutes to perform.  You are scheduled on 08-23-2014 at 330 PM. You should arrive 15 minutes prior to your appointment time for registration. Please follow the written instructions below on the day of your exam:  WARNING: IF YOU ARE ALLERGIC TO IODINE/X-RAY DYE, PLEASE NOTIFY RADIOLOGY IMMEDIATELY AT [phone]! YOU WILL BE GIVEN A 13 HOUR PREMEDICATION PREP.  1) Do not eat or drink anything after 12:30 pm (4 hours prior to your test) 2) You have been given 2 bottles of oral contrast to drink. The solution may taste better if refrigerated, but do NOT add ice or any other liquid to this solution. Shake well before drinking.    Drink 1 bottle of contrast @ 1:30 PM (2 hours prior to your exam)  Drink 1 bottle of contrast @ 2:30 PM (1 hour prior to your exam)  You may take any medications as prescribed with a small amount of water except for the following: Metformin, Glucophage, Glucovance, Avandamet, Riomet, Fortamet, Actoplus Met, Janumet, Glumetza or Metaglip. The above medications must be held the day of the exam AND 48 hours after the exam.  The purpose of you drinking the oral contrast is to aid in the visualization of your  intestinal tract. The contrast solution may cause some diarrhea. Before your exam is started, you will be given a small amount of fluid to drink. Depending on your individual set of symptoms, you may also receive an intravenous injection of x-ray contrast/dye. Plan on being at [facility] for 30 minutes or long, depending on the type of exam you are having performed.  This test typically takes 30-45 minutes to complete. ______________________________________________________________________________________________________________________________________________________________________________________________________   

## 2014-08-23 ENCOUNTER — Ambulatory Visit (HOSPITAL_COMMUNITY)
Admission: RE | Admit: 2014-08-23 | Discharge: 2014-08-23 | Disposition: A | Discharge status: Home / Self Care | Payer: BC Managed Care – PPO | Source: Ambulatory Visit | Attending: Gastroenterology | Admitting: Gastroenterology

## 2014-08-23 ENCOUNTER — Encounter (HOSPITAL_COMMUNITY): Payer: Self-pay

## 2014-08-23 DIAGNOSIS — R110 Nausea: Secondary | ICD-10-CM | POA: Insufficient documentation

## 2014-08-23 DIAGNOSIS — Z9049 Acquired absence of other specified parts of digestive tract: Secondary | ICD-10-CM | POA: Insufficient documentation

## 2014-08-23 DIAGNOSIS — K760 Fatty (change of) liver, not elsewhere classified: Secondary | ICD-10-CM | POA: Insufficient documentation

## 2014-08-23 DIAGNOSIS — Z90710 Acquired absence of both cervix and uterus: Secondary | ICD-10-CM | POA: Insufficient documentation

## 2014-08-23 DIAGNOSIS — R109 Unspecified abdominal pain: Secondary | ICD-10-CM | POA: Insufficient documentation

## 2014-08-23 DIAGNOSIS — R160 Hepatomegaly, not elsewhere classified: Secondary | ICD-10-CM | POA: Diagnosis not present

## 2014-08-23 MED ORDER — IOHEXOL 300 MG/ML  SOLN
100.0000 mL | Freq: Once | INTRAMUSCULAR | Status: AC | PRN
  Administered 2014-08-23: 100 mL via INTRAVENOUS

## 2014-08-23 NOTE — Progress Notes (Signed)
     08/23/2014 SHONI QUIJAS [id number] [birth year]   History of Present Illness:  This is a 53 year old female who is known to Dr. [name], last seen in 03/2013 for nausea that was thought to be related to her chronic medical problems and polypharmacy.  She had an EGD in 10/2012, which was normal s/p fundoplication but was empirically dilated with 54 [place] dilator.  Last colonoscopy was 04/2010 that was normal except for mild melanosis and small internal hemorrhoids.  She presents to our office again today with complaints of nausea along with epigastric/upper abdominal pain.  She says that the nausea seemed to go away for a while but is back again.  None of her medications are new.  She also complains of epigastric/upper abdominal pain for the past couple of weeks as well.  Denies vomiting because she can't vomit after her fundoplication.  No complaints of dysphagia.  She is on Dexilant 60 mg daily AND omeprazole 20 mg BID for GERD.  Says that phenergan helped with her nausea previously.   Current Medications, Allergies, Past Medical History, Past Surgical History, Family History and Social History were reviewed in [employer] record.   Physical Exam: BP 148/70 mmHg  Pulse 84  Ht 5\' 6"  (1.676 m)  Wt 264 lb 4 oz (119.863 kg)  BMI 42.67 kg/m2 General:  Obese, well-developed female in no acute distress Head: Normocephalic and atraumatic Eyes:  sclerae anicteric, conjunctiva pink  Ears: Normal auditory acuity Lungs: Clear throughout to auscultation Heart: Regular rate and rhythm Abdomen: Soft, obese, non-distended.  BS present.  Prior open cholecystectomy and laparotomy scars noted.  Epigastric and mid-abdominal TTP without R/R/G. Musculoskeletal: Symmetrical with no gross deformities  Extremities: No edema  Neurological: Alert oriented x 4, grossly non-focal Psychological:  Alert and cooperative. Normal mood and affect  Assessment and Recommendations: -53  year old female with multiple medical problems and polypharmacy with complaints of recurrent nausea (had the same issue in the past) and now with epigastric/upper abdominal pain.  Agree that her nausea may be related to her chronic medical problems and polypharmacy.  Abdominal pain may be related to adhesions.  Has not had any recent labs or cross-sectional imaging of her abdomen.  Will order CBC, CMP, and lipase as well as a CT scan of the abdomen and pelvis with contrast to rule out other organic/pathologic causes of her symptoms.  I think that she is on too much acid suppression and that she should discontinue the omeprazole, but continue the Dexilant 60 mg daily.  Will give her some more phenergan to take prn.

## 2014-08-26 ENCOUNTER — Ambulatory Visit: Payer: BC Managed Care – PPO | Admitting: Cardiology

## 2014-08-26 ENCOUNTER — Other Ambulatory Visit: Payer: Self-pay | Admitting: Internal Medicine

## 2014-08-26 ENCOUNTER — Encounter: Payer: Self-pay | Admitting: Critical Care Medicine

## 2014-08-26 ENCOUNTER — Ambulatory Visit (INDEPENDENT_AMBULATORY_CARE_PROVIDER_SITE_OTHER): Payer: BC Managed Care – PPO | Admitting: Critical Care Medicine

## 2014-08-26 VITALS — BP 138/78 | HR 78 | Temp 97.6°F | Ht 66.0 in | Wt 266.4 lb

## 2014-08-26 DIAGNOSIS — D860 Sarcoidosis of lung: Secondary | ICD-10-CM

## 2014-08-26 MED ORDER — PREDNISONE 1 MG PO TABS: 2.0000 mg | ORAL_TABLET | Freq: Every day | ORAL | Status: DC

## 2014-08-26 NOTE — Patient Instructions (Signed)
No change in medications. Return in        6 months        

## 2014-08-26 NOTE — Progress Notes (Signed)
Subjective:    Patient ID: [name], female    DOB: [birth year], 53 y.o.   MRN: [id number]  HPI 53 y.o. WF never smoker with Sarcoid, GERD, VCD, cyclical cough     27/25/3664 Chief Complaint  Patient presents with  . Follow-up    none  No c/o  No cough or dyspnea.  No chest pain.  Pt denies any significant sore throat, nasal congestion or excess secretions, fever, chills, sweats, unintended weight loss, pleurtic or exertional chest pain, orthopnea PND, or leg swelling Pt denies any increase in rescue therapy over baseline, denies waking up needing it or having any early am or nocturnal exacerbations of coughing/wheezing/or dyspnea. Pt also denies any obvious fluctuation in symptoms with  weather or environmental change or other alleviating or aggravating factors  Review of SystemsConstitutional:   No  weight loss, night sweats,  Fevers, chills, fatigue, or  lassitude.  HEENT:   No headaches,  Difficulty swallowing,  Tooth/dental problems, or  Sore throat,                No sneezing, itching, ear ache, nasal congestion, post nasal drip,   CV:  No chest pain,  Orthopnea, PND, swelling in lower extremities, anasarca, dizziness, palpitations, syncope.   GI  No heartburn, indigestion, abdominal pain, nausea, vomiting, diarrhea, change in bowel habits, loss of appetite, bloody stools.   Resp: No shortness of breath with exertion or at rest.  No excess mucus, no productive cough,  No non-productive cough,  No coughing up of blood.  No change in color of mucus.  No wheezing.  No chest wall deformity  Skin: no rash or lesions.  GU: no dysuria, change in color of urine, no urgency or frequency.  No flank pain, no hematuria   MS:  No joint pain or swelling.  No decreased range of motion.  + foot pain   Psych:  No change in mood or affect. No depression or anxiety.  No memory loss.      Objective:   Physical ExamBP 138/78 mmHg  Pulse 78  Temp(Src) 97.6 F (36.4 C)  Ht 5' 6"  (1.676 m)  Wt 266 lb 6.4 oz (120.838 kg)  BMI 43.02 kg/m2  SpO2 97%  GEN: A/Ox3; pleasant , NAD   HEENT:  [place]/AT,  EACs-clear, TMs-wnl, NOSE-clear drainage THROAT-clear, no lesions, no postnasal drip or exudate noted.   NECK:  Supple w/ fair ROM; no JVD; normal carotid impulses w/o bruits; no thyromegaly or nodules palpated; no lymphadenopathy.  RESP  Diminished BS in bases, w/ no wheezing noted   CARD:  RRR, no m/r/g  , no peripheral edema, pulses intact, no cyanosis or clubbing.  GI:   Soft & nt; nml bowel sounds; no organomegaly or masses detected.  Musco: Warm bil, no deformities or joint swelling noted.   Neuro: alert, no focal deficits noted.    Skin: Warm, no lesions or rashes     Assessment & Plan:   Sarcoidosis of lung Severe sarcoidosis now followed at [facility] with thrice weekly azithromycin and Dulera along with low-dose prednisone Plan Management per [facility] pulmonary clinic     Updated Medication List Outpatient Encounter Prescriptions as of 08/26/2014  Medication Sig  . albuterol (PROVENTIL HFA;VENTOLIN HFA) 108 (90 BASE) MCG/ACT inhaler Inhale 2 puffs into the lungs every 6 (six) hours as needed. For wheezing.  [name] albuterol (PROVENTIL) (5 MG/ML) 0.5% nebulizer solution Take 2.5 mg by nebulization every 6 (six) hours  as needed. For wheezing.  [name] allopurinol (ZYLOPRIM) 100 MG tablet Take 100 mg by mouth at bedtime.   [name] aspirin EC 81 MG tablet Take 81 mg by mouth daily.   [name] atorvastatin (LIPITOR) 40 MG tablet Take one tablet by mouth one time daily  . azithromycin (ZITHROMAX) 250 MG tablet Take 250 mg by mouth 3 (three) times a week.  . B-D ULTRAFINE III SHORT PEN 31G X 8 MM MISC Follow directions provided by physician  . benzonatate (TESSALON) 200 MG capsule Take 200 mg by mouth.  . Blood Glucose Monitoring Suppl (FREESTYLE FREEDOM LITE) W/DEVICE KIT Use as adviced  . CARTIA XT 120 MG 24 hr capsule TAKE ONE CAPSULE BY MOUTH ONCE DAILY IN THE  EVENING   . carvedilol (COREG) 25 MG tablet Take 1 tablet (25 mg total) by mouth 2 (two) times daily with a meal.  . chlorpheniramine-HYDROcodone (TUSSIONEX) 10-8 MG/5ML LQCR Take 5 mLs by mouth every 12 (twelve) hours as needed for cough.  . cholecalciferol 5000 UNITS TABS Take 1 tablet (5,000 Units total) by mouth daily.  [name] DEXILANT 60 MG capsule TAKE ONE CAPSULE BY MOUTH ONE TIME DAILY   . estradiol (ESTRACE) 2 MG tablet Take by mouth daily.   . fenofibrate (TRICOR) 145 MG tablet Take one tablet by mouth one time daily  . furosemide (LASIX) 40 MG tablet Take one tablet by mouth twice daily  . gabapentin (NEURONTIN) 100 MG capsule Take 1 capsule (100 mg total) by mouth 3 (three) times daily.  [name] glucose blood (ONE TOUCH ULTRA TEST) test strip 1 each by Other route 3 (three) times daily. Use as instructed  . insulin aspart (NOVOLOG FLEXPEN) 100 UNIT/ML FlexPen Inject 8-12 Units into the skin 3 (three) times daily with meals. Pens please.  . Insulin Glargine (LANTUS) 100 UNIT/ML Solostar Pen Inject 35 Units into the skin daily at 10 pm.  . LORazepam (ATIVAN) 0.5 MG tablet Take 1 tablet (0.5 mg total) by mouth 2 (two) times daily as needed for anxiety.  [name] losartan (COZAAR) 100 MG tablet Take one tablet by mouth one time daily  . metFORMIN (GLUCOPHAGE) 1000 MG tablet Take 1 tablet (1,000 mg total) by mouth 2 (two) times daily with a meal.  . mometasone-formoterol (DULERA) 200-5 MCG/ACT AERO Inhale 2 puffs into the lungs 2 (two) times daily.  . nitroGLYCERIN (NITROSTAT) 0.4 MG SL tablet Place 0.4 mg under the tongue every 5 (five) minutes as needed. x3 doses as needed for chest pain.  [name] PARoxetine Mesylate (BRISDELLE) 7.5 MG CAPS Take 7.5 mg by mouth daily.  . predniSONE (DELTASONE) 1 MG tablet Take 2 tablets (2 mg total) by mouth daily with breakfast.  . predniSONE (DELTASONE) 5 MG tablet Take 5 mg by mouth daily with breakfast. Sig 22ms daily  . venlafaxine XR (EFFEXOR-XR) 75 MG 24 hr capsule TAKE  ONE CAPSULE BY MOUTH TWICE DAILY   . [DISCONTINUED] potassium chloride SA (K-DUR,KLOR-CON) 20 MEQ tablet TAKE ONE TABLET BY MOUTH THREE TIMES DAILY   . [DISCONTINUED] predniSONE (DELTASONE) 1 MG tablet Take 4 mg by mouth daily with breakfast.   . buPROPion (WELLBUTRIN XL) 300 MG 24 hr tablet TAKE ONE TABLET BY MOUTH ONE TIME DAILY   . promethazine (PHENERGAN) 12.5 MG tablet Take 1 tablet (12.5 mg total) by mouth every 6 (six) hours as needed for nausea or vomiting.  . [DISCONTINUED] nitroGLYCERIN (NITROSTAT) 0.4 MG SL tablet Place 0.4 mg under the tongue as needed.

## 2014-08-27 ENCOUNTER — Telehealth: Payer: Self-pay | Admitting: Gastroenterology

## 2014-08-27 NOTE — Telephone Encounter (Signed)
Patient calling for CT results. Please, advise. 

## 2014-08-27 NOTE — Assessment & Plan Note (Signed)
Severe sarcoidosis now followed at [facility] with thrice weekly azithromycin and Dulera along with low-dose prednisone Plan Management per [facility] pulmonary clinic

## 2014-08-28 NOTE — Progress Notes (Signed)
Agree with initial assessment and plans 

## 2014-08-29 ENCOUNTER — Encounter: Payer: Self-pay | Admitting: Cardiology

## 2014-08-29 ENCOUNTER — Ambulatory Visit (INDEPENDENT_AMBULATORY_CARE_PROVIDER_SITE_OTHER): Payer: BC Managed Care – PPO | Admitting: Cardiology

## 2014-08-29 ENCOUNTER — Ambulatory Visit (HOSPITAL_COMMUNITY)
Admission: RE | Admit: 2014-08-29 | Discharge: 2014-08-29 | Disposition: A | Discharge status: Home / Self Care | Payer: BC Managed Care – PPO | Source: Ambulatory Visit | Attending: Cardiology | Admitting: Cardiology

## 2014-08-29 VITALS — BP 182/82 | HR 82 | Ht 66.0 in | Wt 259.0 lb

## 2014-08-29 DIAGNOSIS — R609 Edema, unspecified: Secondary | ICD-10-CM

## 2014-08-29 DIAGNOSIS — M7989 Other specified soft tissue disorders: Secondary | ICD-10-CM

## 2014-08-29 NOTE — Progress Notes (Signed)
Bilateral Lower Ext. Venous Duplex Completed. Negative for DVT or SVT. Akram Kissick, BS, RDMS, RVT  

## 2014-08-29 NOTE — Patient Instructions (Signed)
Your physician has requested that you have an echocardiogram. Echocardiography is a painless test that uses sound waves to create images of your heart. It provides your doctor with information about the size and shape of your heart and how well your heart's chambers and valves are working. This procedure takes approximately one hour. There are no restrictions for this procedure.   

## 2014-08-29 NOTE — Progress Notes (Signed)
08/29/2014 [name]   1961-06-11  [id number]  Primary Physician Garret Reddish, MD Primary Cardiologist: Dr. Ellyn Hack  HPI:  The patient is a 53 year old female, followed by Dr. Ellyn Hack, who presents to clinic today for follow-up. In July 2014, she underwent a right and left heart catheterization, in the setting of chest pain, to define her coronary anatomy and to evaluate her pulmonary pressures. Basically, this was relatively unrevealing and her coronaries were without disease. Her right ventricular pressures were in the low to mid 30s, wedge pressure was in the low teens and LVEDP was roughly 20. There was no clear cardiac etiology for her chest discomfort. Her last 2-D echocardiogram was May 2014, demonstrating normal systolic function with estimated ejection fraction of 50-55%. Her other medical history significant for sarcoidosis with lung involvement, diastolic dysfunction, type 2 diabetes, hyperlipidemia, hypertension and obesity.  Her last office visit with Dr. Ellyn Hack was in December 2014. At that time, she was felt to be stable from a cardiovascular standpoint.  She presents to clinic today for evaluation after recently being seen at the [facility] last week for evaluation of bilateral LE edema. She also noted redness and pain in her cath muslces. No ultrasound was performed to r/o DVT at that time. She was instructed to increase her lasix and potassium for several days and to report to our office for evaluation.  Today in clinic, she continues to have mild edema and mild erythema along the anterior aspect of both legs. Her area of erythema has not spread and is slightly improved. She also has increased warmth of both extremities and pain in her posterior cath with dorsiflexion of the foot. She reports recent travel to [facility] 2 weeks ago. She denies associated chest pain. She has mild dyspnea at baseline with her sarcoidosis, but denies any significant change. No  orthopnea/PND. No fever, chills, n/v.    Current Outpatient Prescriptions  Medication Sig Dispense Refill  . albuterol (PROVENTIL HFA;VENTOLIN HFA) 108 (90 BASE) MCG/ACT inhaler Inhale 2 puffs into the lungs every 6 (six) hours as needed. For wheezing.    [name] albuterol (PROVENTIL) (5 MG/ML) 0.5% nebulizer solution Take 2.5 mg by nebulization every 6 (six) hours as needed. For wheezing.    [name] allopurinol (ZYLOPRIM) 100 MG tablet Take 100 mg by mouth at bedtime.     [name] aspirin EC 81 MG tablet Take 81 mg by mouth daily.     [name] atorvastatin (LIPITOR) 40 MG tablet Take one tablet by mouth one time daily 90 tablet 3  . azithromycin (ZITHROMAX) 250 MG tablet Take 250 mg by mouth 3 (three) times a week.    . B-D ULTRAFINE III SHORT PEN 31G X 8 MM MISC Follow directions provided by physician 30 each 2  . benzonatate (TESSALON) 200 MG capsule Take 200 mg by mouth.    . Blood Glucose Monitoring Suppl (FREESTYLE FREEDOM LITE) W/DEVICE KIT Use as adviced 1 each 0  . buPROPion (WELLBUTRIN XL) 300 MG 24 hr tablet TAKE ONE TABLET BY MOUTH ONE TIME DAILY  30 tablet 10  . CARTIA XT 120 MG 24 hr capsule TAKE ONE CAPSULE BY MOUTH ONCE DAILY IN THE EVENING  30 capsule 2  . carvedilol (COREG) 25 MG tablet Take 1 tablet (25 mg total) by mouth 2 (two) times daily with a meal. 180 tablet 3  . chlorpheniramine-HYDROcodone (TUSSIONEX) 10-8 MG/5ML LQCR Take 5 mLs by mouth every 12 (twelve) hours as needed for cough. 480 mL  0  . cholecalciferol 5000 UNITS TABS Take 1 tablet (5,000 Units total) by mouth daily. 30 tablet 3  . DEXILANT 60 MG capsule TAKE ONE CAPSULE BY MOUTH ONE TIME DAILY  30 capsule 6  . estradiol (ESTRACE) 2 MG tablet Take by mouth daily.     . fenofibrate (TRICOR) 145 MG tablet Take one tablet by mouth one time daily 30 tablet 10  . furosemide (LASIX) 40 MG tablet Take one tablet by mouth twice daily    . gabapentin (NEURONTIN) 100 MG capsule Take 1 capsule (100 mg total) by mouth 3 (three) times daily. 90  capsule 3  . glucose blood (ONE TOUCH ULTRA TEST) test strip 1 each by Other route 3 (three) times daily. Use as instructed 100 each 5  . insulin aspart (NOVOLOG FLEXPEN) 100 UNIT/ML FlexPen Inject 8-12 Units into the skin 3 (three) times daily with meals. Pens please. 15 mL 11  . Insulin Glargine (LANTUS) 100 UNIT/ML Solostar Pen Inject 35 Units into the skin daily at 10 pm. 15 mL 3  . LORazepam (ATIVAN) 0.5 MG tablet Take 1 tablet (0.5 mg total) by mouth 2 (two) times daily as needed for anxiety. 30 tablet 1  . losartan (COZAAR) 100 MG tablet Take one tablet by mouth one time daily 90 tablet 1  . metFORMIN (GLUCOPHAGE) 1000 MG tablet Take 1 tablet (1,000 mg total) by mouth 2 (two) times daily with a meal. 180 tablet 3  . mometasone-formoterol (DULERA) 200-5 MCG/ACT AERO Inhale 2 puffs into the lungs 2 (two) times daily. 1 Inhaler 6  . nitroGLYCERIN (NITROSTAT) 0.4 MG SL tablet Place 0.4 mg under the tongue every 5 (five) minutes as needed. x3 doses as needed for chest pain.    [name] PARoxetine Mesylate (BRISDELLE) 7.5 MG CAPS Take 7.5 mg by mouth daily.    . potassium chloride SA (K-DUR,KLOR-CON) 20 MEQ tablet TAKE ONE TABLET BY MOUTH THREE TIMES DAILY  90 tablet 1  . predniSONE (DELTASONE) 1 MG tablet Take 2 tablets (2 mg total) by mouth daily with breakfast.    . predniSONE (DELTASONE) 5 MG tablet Take 5 mg by mouth daily with breakfast. Sig 72ms daily    . promethazine (PHENERGAN) 12.5 MG tablet Take 1 tablet (12.5 mg total) by mouth every 6 (six) hours as needed for nausea or vomiting. 30 tablet 0  . venlafaxine XR (EFFEXOR-XR) 75 MG 24 hr capsule TAKE ONE CAPSULE BY MOUTH TWICE DAILY  60 capsule 11   No current facility-administered medications for this visit.    Allergies  Allergen Reactions  . Lisinopril Cough  . Methotrexate     REACTION: peri-oral and buccal lesions.  . Clindamycin/Lincomycin Nausea And Vomiting and Rash    History   Social History  . Marital Status: Married     Spouse Name: WJOSCELYNN BRUTUS   Number of Children: 2  . Years of Education: 12   Occupational History  . DISABLED    Social History Main Topics  . Smoking status: Never Smoker   . Smokeless tobacco: Never Used  . Alcohol Use: No  . Drug Use: No  . Sexual Activity: Yes    Birth Control/ Protection: Surgical   Other Topics Concern  . Not on file   Social History Narrative   Married 1994. 2 sons who both live close and 1 grandson.       Disability due to sarcoidosis. 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  Cardiovascular: negative for chest pain, dyspnea on exertion, edema, orthopnea, palpitations, paroxysmal nocturnal dyspnea or shortness of breath Dermatological: negative for rash Respiratory: negative for cough or wheezing Urologic: negative for hematuria Abdominal: negative for nausea, vomiting, diarrhea, bright red blood per rectum, melena, or hematemesis Neurologic: negative for visual changes, syncope, or dizziness All other systems reviewed and are otherwise negative except as noted above.    Blood pressure 182/82, pulse 82, height _0  (1.676 m), weight 259 lb (117.482 kg).  General appearance: alert, cooperative and no distress Neck: no carotid bruit and no JVD Lungs: clear to auscultation bilaterally Heart: regular rate and rhythm, S1, S2 normal, no murmur, click, rub or gallop Extremities: trace LEE pitting edema. mild anterior erthema of LEE bilaterally; + calf pain with dorsiflexion of both LEE  Pulses: 2+ and symmetric Skin: warm and dry Neurologic: Grossly normal  EKG not performed  ASSESSMENT AND PLAN:   1. Bilateral LEE: Bilateral lower extremity Doppler studies were performed in clinic today to rule out DVT. Study  was negative. Based on the appearance of her legs on physical exam, I do not feel that this is cellulitis. Denies any fevers or chills. She has been instructed to notify our office if she notes worsening skin changes/discolorations or severe pain. She does have trace bilateral pitting edema. She has been instructed to continue to take her Lasix as prescribed. Continue to check weight daily and increase Lasix if weight increases more than 3 pounds in a 24 hour period. She has been instructed to adhere to a low-sodium diet. We will also check a 2-D echocardiogram to assess whether or not there's been any change in systolic function since her last assessment.   PLAN Continue management of diastolic heart failure: Daily Lasix, low-sodium diet and daily weights. Check a 2-D echocardiogram to ensure that there has been no changes in systolic function. We will notify the patient with results and have her follow-up in clinic if any significant changes are noted on 2-D echo. Otherwise, continue routine follow-up with Dr. Ellyn Hack.  SIMMONS, BRITTAINYPA-C 08/29/2014 2:59 PM

## 2014-08-30 ENCOUNTER — Encounter: Payer: Self-pay | Admitting: Internal Medicine

## 2014-08-30 ENCOUNTER — Ambulatory Visit (INDEPENDENT_AMBULATORY_CARE_PROVIDER_SITE_OTHER): Payer: BC Managed Care – PPO | Admitting: Internal Medicine

## 2014-08-30 VITALS — BP 142/62 | HR 85 | Temp 97.9°F | Resp 12 | Wt 262.0 lb

## 2014-08-30 DIAGNOSIS — E1141 Type 2 diabetes mellitus with diabetic mononeuropathy: Secondary | ICD-10-CM | POA: Diagnosis not present

## 2014-08-30 DIAGNOSIS — IMO0002 Reserved for concepts with insufficient information to code with codable children: Secondary | ICD-10-CM

## 2014-08-30 DIAGNOSIS — E1165 Type 2 diabetes mellitus with hyperglycemia: Secondary | ICD-10-CM

## 2014-08-30 DIAGNOSIS — E1149 Type 2 diabetes mellitus with other diabetic neurological complication: Secondary | ICD-10-CM

## 2014-08-30 MED ORDER — INSULIN ASPART 100 UNIT/ML FLEXPEN: 8.0000 [IU] | PEN_INJECTOR | Freq: Three times a day (TID) | SUBCUTANEOUS | Status: DC

## 2014-08-30 NOTE — Progress Notes (Signed)
Patient ID: [name], female   DOB: [birth year], 53 y.o.   MRN: [id number]  HPI: [name] is a 53 y.o.-year-old female, returning for f/u DM2, dx 2007, insulin-dependent, uncontrolled, with complications (CHF stage 2, peripheral neuropathy). Last visit 3 mo ago.  She had foot surgery 04/09/2014. Less active since then.   Last hemoglobin A1c was: Lab Results  Component Value Date   HGBA1C 10.9* 07/01/2014   HGBA1C 8.7* 12/10/2013   HGBA1C 7.9* 07/24/2013  She is on Prednisone 10 >> 40 >> 7 mg for sarcoidosis.   [name] on a regimen of: - Metformin 1000 mg po bid >> tolerates it well. She is on a B-complex. - NovoLog - please inject 15 min before a meal - she was only using it once a day, now does not forget it - 7 units with a small meal (am) - 8 units with a regular meal (lunch) - 12 units with a large meal (dinner) - Lantus 23 >> 28 >> 35 units qhs - pen She stopped Invokana 300 mg daily - added 01/2014 - had 2 yeast infections (stopped 06/2014) We stopped Glipizide XL 10 mg. We stopped Glyburide 2.5 mg  Pt checks her sugars 1 a day and they are: - am: 130-260 >> 109-262 (higher sugars after Prednisone) >>115-258 (higher range when on Prednisone) >> 200-210 >> 135-225, 266 - 2h after b'fast: n/c >> 200, 315 >> n/c - before lunch: n/c >> n/c >> n/c >> 190s >> n/c - 2h after lunch: n/c >> n/c >> 414 >> n/c - before dinner: n/c >> 230 >> n/c >> 190s >> 268 - 2h after dinner: n/c >> 245 >> 229, 247 >> n/c - bedtime: 200s >> 241-462 (mostly 200s) >> 199-395 >> 190s >> 240-342 - nighttime: n/c No lows. Lowest sugar was 135; she has hypoglycemia awareness at 70s.  Highest sugar was 500s >> 300s.  Meter: Freestyle Freedom Lite. (last night)   She had a nutrition class >> helped.   Pt's meals are: - Breakfast: egg + bacon or if go out: bisquit - Lunch: sandwich - Dinner: meat + veggies + starch - Snacks: pork skins and cookies - 1-2 a day; also after dinner No  exercise.  - no CKD, last BUN/creatinine:  Lab Results  Component Value Date   BUN 15 08/21/2014   CREATININE 0.7 08/21/2014  On Losartan. - last set of lipids: Lab Results  Component Value Date   CHOL 211* 12/10/2013   HDL 44.80 12/10/2013   LDLCALC UNABLE TO CALCULATE IF TRIGLYCERIDE OVER 400 mg/dL 05/28/2011   LDLDIRECT 117.9 12/10/2013   TRIG 508.0* 12/10/2013   CHOLHDL 5 12/10/2013  On Lipitor. - last eye exam was in 05/20/2014 - sees eye dr once a year. No DR.  - + numbness and tingling in her feet and legs hurt. She has restless legs. We started Neurontin at last visit >> 100 mg hs >> works well.  She also has gout, HL, HTN, sarcoidosis of lung, GERD, COPD, OSA >> resolved.  ROS: Constitutional: no weight gain/loss, no fatigue, no subjective hyperthermia/hypothermia Eyes: no blurry vision, no xerophthalmia ENT: no sore throat, no nodules palpated in throat, no dysphagia/odynophagia, no hoarseness Cardiovascular: no CP/SOB/palpitations/+ periankle swelling Respiratory: no cough/no SOB Gastrointestinal: no N/V/D/C/heartburn Musculoskeletal: no muscle/joint aches Skin: no rashes Neurological: no tremors/+numbness - feet/tingling/dizziness  PE: BP 142/62 mmHg  Pulse 85  Temp(Src) 97.9 F (36.6 C) (Oral)  Resp 12  Wt 262 lb (118.842 kg)  SpO2 97% Wt Readings from Last 3 Encounters:  08/30/14 262 lb (118.842 kg)  08/29/14 259 lb (117.482 kg)  08/26/14 266 lb 6.4 oz (120.838 kg)   Constitutional: obese, in NAD Eyes: PERRLA, EOMI, no exophthalmos ENT: moist mucous membranes, no thyromegaly, no cervical lymphadenopathy Cardiovascular: RRR, No MRG Respiratory: CTA B Gastrointestinal: abdomen soft, NT, ND, BS+ Musculoskeletal: no deformities, strength intact in all 4 Skin: moist, warm, no rashes Neurological: no mild tremor with outstretched hands  ASSESSMENT: 1. DM2, insulin-dependent, uncontrolled, with complications - CHF stage 2 - peripheral  neuropathy  2. Diabetic peripheral neuropathy - on neurontin 100 >> works well  PLAN:  1. Patient with long-standing, recently more uncontrolled diabetes, on oral antidiabetic regimen + Basal insulin +  mealtime insulin. Sugars still very high. - I suggested to:  Patient Instructions  Please continue: - Metformin 1000 mg 2x a day Continue Lantus 35 units at bedtime Increase NovoLog 15 min before every meal: - 8 units with a small meal - 12 units with a regular meal - 16 units with a large meal  Please return in 1.5 month with your sugar log.    - continue checking sugars at different times of the day - check 3 times a day, rotating checks! - she is up to date with yearly eye exams - already had flu and PNA vaccine this season - Return to clinic in 1.5 mo with sugar log   2. Diabetic PN - continue Neurontin 100 mg hs >> works well - no SE

## 2014-08-30 NOTE — Patient Instructions (Signed)
Please continue: - Metformin 1000 mg 2x a day Continue Lantus 35 units at bedtime Increase NovoLog 15 min before every meal: - 8 units with a small meal - 12 units with a regular meal - 16 units with a large meal  Please return in 1.5 month with your sugar log.

## 2014-09-02 ENCOUNTER — Ambulatory Visit (HOSPITAL_COMMUNITY)
Admission: RE | Admit: 2014-09-02 | Discharge: 2014-09-02 | Disposition: A | Discharge status: Home / Self Care | Payer: BC Managed Care – PPO | Source: Ambulatory Visit | Attending: Cardiology | Admitting: Cardiology

## 2014-09-02 DIAGNOSIS — I517 Cardiomegaly: Secondary | ICD-10-CM

## 2014-09-02 DIAGNOSIS — R609 Edema, unspecified: Secondary | ICD-10-CM | POA: Diagnosis not present

## 2014-09-02 DIAGNOSIS — M7989 Other specified soft tissue disorders: Secondary | ICD-10-CM

## 2014-09-02 NOTE — Progress Notes (Signed)
2D Echo Performed 09/02/2014    Marygrace Drought, RCS

## 2014-09-10 ENCOUNTER — Telehealth: Payer: Self-pay | Admitting: Cardiology

## 2014-09-10 NOTE — Telephone Encounter (Signed)
[name] is calling to get her test results of her Echo . Please Call  Thanks

## 2014-09-10 NOTE — Telephone Encounter (Signed)
SPOKE TO PATIENT  PRELIMINARY RESULTS  GIVEN ECHO. WILL CONTACT AFTER FINAL REPORT PATIENT VERBALIZED UNDERSTANDING.

## 2014-09-15 ENCOUNTER — Encounter (HOSPITAL_COMMUNITY): Payer: Self-pay | Admitting: *Deleted

## 2014-09-15 ENCOUNTER — Emergency Department (HOSPITAL_COMMUNITY): Payer: BC Managed Care – PPO

## 2014-09-15 ENCOUNTER — Inpatient Hospital Stay (HOSPITAL_COMMUNITY)
Admission: EM | Admit: 2014-09-15 | Discharge: 2014-09-18 | DRG: 202 | Disposition: A | Discharge status: Home / Self Care | Payer: BC Managed Care – PPO | Attending: Internal Medicine | Admitting: Internal Medicine

## 2014-09-15 DIAGNOSIS — R0600 Dyspnea, unspecified: Secondary | ICD-10-CM

## 2014-09-15 DIAGNOSIS — I5032 Chronic diastolic (congestive) heart failure: Secondary | ICD-10-CM | POA: Diagnosis present

## 2014-09-15 DIAGNOSIS — Z6841 Body Mass Index (BMI) 40.0 and over, adult: Secondary | ICD-10-CM | POA: Diagnosis not present

## 2014-09-15 DIAGNOSIS — R109 Unspecified abdominal pain: Secondary | ICD-10-CM | POA: Diagnosis present

## 2014-09-15 DIAGNOSIS — M109 Gout, unspecified: Secondary | ICD-10-CM | POA: Diagnosis present

## 2014-09-15 DIAGNOSIS — J441 Chronic obstructive pulmonary disease with (acute) exacerbation: Secondary | ICD-10-CM

## 2014-09-15 DIAGNOSIS — R0602 Shortness of breath: Secondary | ICD-10-CM | POA: Diagnosis not present

## 2014-09-15 DIAGNOSIS — E1165 Type 2 diabetes mellitus with hyperglycemia: Secondary | ICD-10-CM | POA: Diagnosis present

## 2014-09-15 DIAGNOSIS — I10 Essential (primary) hypertension: Secondary | ICD-10-CM | POA: Diagnosis not present

## 2014-09-15 DIAGNOSIS — E1142 Type 2 diabetes mellitus with diabetic polyneuropathy: Secondary | ICD-10-CM | POA: Diagnosis present

## 2014-09-15 DIAGNOSIS — E1159 Type 2 diabetes mellitus with other circulatory complications: Secondary | ICD-10-CM | POA: Diagnosis present

## 2014-09-15 DIAGNOSIS — D649 Anemia, unspecified: Secondary | ICD-10-CM | POA: Diagnosis present

## 2014-09-15 DIAGNOSIS — K219 Gastro-esophageal reflux disease without esophagitis: Secondary | ICD-10-CM | POA: Diagnosis present

## 2014-09-15 DIAGNOSIS — E1141 Type 2 diabetes mellitus with diabetic mononeuropathy: Secondary | ICD-10-CM | POA: Diagnosis not present

## 2014-09-15 DIAGNOSIS — I152 Hypertension secondary to endocrine disorders: Secondary | ICD-10-CM | POA: Diagnosis present

## 2014-09-15 DIAGNOSIS — E1129 Type 2 diabetes mellitus with other diabetic kidney complication: Secondary | ICD-10-CM | POA: Diagnosis present

## 2014-09-15 DIAGNOSIS — D860 Sarcoidosis of lung: Secondary | ICD-10-CM | POA: Diagnosis not present

## 2014-09-15 DIAGNOSIS — E785 Hyperlipidemia, unspecified: Secondary | ICD-10-CM | POA: Insufficient documentation

## 2014-09-15 DIAGNOSIS — J449 Chronic obstructive pulmonary disease, unspecified: Secondary | ICD-10-CM | POA: Diagnosis present

## 2014-09-15 DIAGNOSIS — J209 Acute bronchitis, unspecified: Secondary | ICD-10-CM | POA: Diagnosis not present

## 2014-09-15 DIAGNOSIS — F329 Major depressive disorder, single episode, unspecified: Secondary | ICD-10-CM | POA: Diagnosis present

## 2014-09-15 DIAGNOSIS — J45901 Unspecified asthma with (acute) exacerbation: Principal | ICD-10-CM | POA: Diagnosis present

## 2014-09-15 DIAGNOSIS — E669 Obesity, unspecified: Secondary | ICD-10-CM | POA: Diagnosis not present

## 2014-09-15 DIAGNOSIS — E1149 Type 2 diabetes mellitus with other diabetic neurological complication: Secondary | ICD-10-CM | POA: Insufficient documentation

## 2014-09-15 DIAGNOSIS — E1140 Type 2 diabetes mellitus with diabetic neuropathy, unspecified: Secondary | ICD-10-CM | POA: Diagnosis not present

## 2014-09-15 DIAGNOSIS — I110 Hypertensive heart disease with heart failure: Secondary | ICD-10-CM | POA: Diagnosis present

## 2014-09-15 DIAGNOSIS — R061 Stridor: Secondary | ICD-10-CM | POA: Diagnosis present

## 2014-09-15 DIAGNOSIS — J42 Unspecified chronic bronchitis: Secondary | ICD-10-CM

## 2014-09-15 DIAGNOSIS — G4730 Sleep apnea, unspecified: Secondary | ICD-10-CM | POA: Diagnosis present

## 2014-09-15 DIAGNOSIS — E1169 Type 2 diabetes mellitus with other specified complication: Secondary | ICD-10-CM | POA: Diagnosis present

## 2014-09-15 LAB — BASIC METABOLIC PANEL
Anion gap: 13 (ref 5–15)
BUN: 12 mg/dL (ref 6–23)
CO2: 28 mEq/L (ref 19–32)
Calcium: 9.6 mg/dL (ref 8.4–10.5)
Chloride: 95 mEq/L — ABNORMAL LOW (ref 96–112)
Creatinine, Ser: 0.56 mg/dL (ref 0.50–1.10)
GFR calc Af Amer: >90 mL/min (ref >90)
GFR calc non Af Amer: >90 mL/min (ref >90)
Glucose, Bld: 197 mg/dL — ABNORMAL HIGH (ref 70–99)
Potassium: 3.8 mEq/L (ref 3.7–5.3)
Sodium: 136 mEq/L — ABNORMAL LOW (ref 137–147)

## 2014-09-15 LAB — I-STAT TROPONIN, ED: Troponin i, poc: 0 ng/mL (ref 0.00–0.08)

## 2014-09-15 LAB — GLUCOSE, CAPILLARY: Glucose-Capillary: 376 mg/dL — ABNORMAL HIGH (ref 70–99)

## 2014-09-15 LAB — CBC WITH DIFFERENTIAL/PLATELET
Basophils Absolute: 0.1 10*3/uL (ref 0.0–0.1)
Basophils Relative: 1 % (ref 0–1)
Eosinophils Absolute: 0.4 10*3/uL (ref 0.0–0.7)
Eosinophils Relative: 4 % (ref 0–5)
HCT: 35.4 % — ABNORMAL LOW (ref 36.0–46.0)
Hemoglobin: 11.4 g/dL — ABNORMAL LOW (ref 12.0–15.0)
Lymphocytes Relative: 24 % (ref 12–46)
Lymphs Abs: 2.6 10*3/uL (ref 0.7–4.0)
MCH: 28.4 pg (ref 26.0–34.0)
MCHC: 32.2 g/dL (ref 30.0–36.0)
MCV: 88.3 fL (ref 78.0–100.0)
Monocytes Absolute: 0.8 10*3/uL (ref 0.1–1.0)
Monocytes Relative: 7 % (ref 3–12)
Neutro Abs: 7.1 10*3/uL (ref 1.7–7.7)
Neutrophils Relative %: 64 % (ref 43–77)
Platelets: 306 10*3/uL (ref 150–400)
RBC: 4.01 MIL/uL (ref 3.87–5.11)
RDW: 13.8 % (ref 11.5–15.5)
WBC: 10.9 10*3/uL — ABNORMAL HIGH (ref 4.0–10.5)

## 2014-09-15 LAB — I-STAT ARTERIAL BLOOD GAS, ED
Acid-Base Excess: 6 mmol/L — ABNORMAL HIGH (ref 0.0–2.0)
Bicarbonate: 29.8 mEq/L — ABNORMAL HIGH (ref 20.0–24.0)
O2 Saturation: 96 %
Patient temperature: 97
TCO2: 31 mmol/L (ref 0–100)
pCO2 arterial: 39.6 mmHg (ref 35.0–45.0)
pH, Arterial: 7.482 — ABNORMAL HIGH (ref 7.350–7.450)
pO2, Arterial: 74 mmHg — ABNORMAL LOW (ref 80.0–100.0)

## 2014-09-15 LAB — MRSA PCR SCREENING: MRSA by PCR: NEGATIVE

## 2014-09-15 LAB — PRO B NATRIURETIC PEPTIDE: Pro B Natriuretic peptide (BNP): 47.8 pg/mL (ref 0–125)

## 2014-09-15 MED ORDER — FENOFIBRATE 160 MG PO TABS
160.0000 mg | ORAL_TABLET | Freq: Every day | ORAL | Status: DC
  Administered 2014-09-16 – 2014-09-18 (×3): 160 mg via ORAL
  Filled 2014-09-15 (×3): qty 1

## 2014-09-15 MED ORDER — LORAZEPAM 0.5 MG PO TABS
0.5000 mg | ORAL_TABLET | Freq: Two times a day (BID) | ORAL | Status: DC | PRN
  Administered 2014-09-17: 0.5 mg via ORAL
  Filled 2014-09-15: qty 1

## 2014-09-15 MED ORDER — GABAPENTIN 100 MG PO CAPS
100.0000 mg | ORAL_CAPSULE | Freq: Three times a day (TID) | ORAL | Status: DC
  Administered 2014-09-15 – 2014-09-18 (×8): 100 mg via ORAL
  Filled 2014-09-15 (×11): qty 1

## 2014-09-15 MED ORDER — ALBUTEROL (5 MG/ML) CONTINUOUS INHALATION SOLN
15.0000 mg/h | INHALATION_SOLUTION | RESPIRATORY_TRACT | Status: DC
  Administered 2014-09-15: 15 mg/h via RESPIRATORY_TRACT
  Filled 2014-09-15: qty 20

## 2014-09-15 MED ORDER — INSULIN GLARGINE 100 UNIT/ML SOLOSTAR PEN: 35.0000 [IU] | PEN_INJECTOR | Freq: Every day | SUBCUTANEOUS | Status: DC

## 2014-09-15 MED ORDER — LEVOFLOXACIN IN D5W 500 MG/100ML IV SOLN
500.0000 mg | INTRAVENOUS | Status: DC
  Filled 2014-09-15: qty 100

## 2014-09-15 MED ORDER — PAROXETINE HCL 10 MG PO TABS
10.0000 mg | ORAL_TABLET | Freq: Every day | ORAL | Status: DC
  Administered 2014-09-16 – 2014-09-18 (×3): 10 mg via ORAL
  Filled 2014-09-15 (×3): qty 1

## 2014-09-15 MED ORDER — SODIUM CHLORIDE 0.9 % IJ SOLN
3.0000 mL | Freq: Two times a day (BID) | INTRAMUSCULAR | Status: DC
  Administered 2014-09-16 – 2014-09-18 (×5): 3 mL via INTRAVENOUS

## 2014-09-15 MED ORDER — METHYLPREDNISOLONE SODIUM SUCC 125 MG IJ SOLR: 125.0000 mg | Freq: Two times a day (BID) | INTRAMUSCULAR | Status: DC

## 2014-09-15 MED ORDER — LOSARTAN POTASSIUM 50 MG PO TABS
100.0000 mg | ORAL_TABLET | Freq: Every day | ORAL | Status: DC
  Administered 2014-09-15 – 2014-09-18 (×4): 100 mg via ORAL
  Filled 2014-09-15 (×5): qty 2

## 2014-09-15 MED ORDER — ALLOPURINOL 100 MG PO TABS
100.0000 mg | ORAL_TABLET | Freq: Every day | ORAL | Status: DC
  Administered 2014-09-16 – 2014-09-18 (×3): 100 mg via ORAL
  Filled 2014-09-15 (×4): qty 1

## 2014-09-15 MED ORDER — LEVOFLOXACIN IN D5W 500 MG/100ML IV SOLN
500.0000 mg | Freq: Once | INTRAVENOUS | Status: AC
  Administered 2014-09-15: 500 mg via INTRAVENOUS
  Filled 2014-09-15: qty 100

## 2014-09-15 MED ORDER — DILTIAZEM HCL ER COATED BEADS 120 MG PO CP24
120.0000 mg | ORAL_CAPSULE | Freq: Every day | ORAL | Status: DC
  Administered 2014-09-16 – 2014-09-18 (×3): 120 mg via ORAL
  Filled 2014-09-15 (×3): qty 1

## 2014-09-15 MED ORDER — BENZONATATE 100 MG PO CAPS: 200.0000 mg | ORAL_CAPSULE | ORAL | Status: DC | PRN

## 2014-09-15 MED ORDER — ACETAMINOPHEN 325 MG PO TABS
650.0000 mg | ORAL_TABLET | Freq: Four times a day (QID) | ORAL | Status: DC | PRN
  Administered 2014-09-15 – 2014-09-18 (×4): 650 mg via ORAL
  Filled 2014-09-15 (×4): qty 2

## 2014-09-15 MED ORDER — HYDROCOD POLST-CHLORPHEN POLST 10-8 MG/5ML PO LQCR
5.0000 mL | Freq: Two times a day (BID) | ORAL | Status: DC | PRN
  Administered 2014-09-15 – 2014-09-17 (×3): 5 mL via ORAL
  Filled 2014-09-15 (×3): qty 5

## 2014-09-15 MED ORDER — INSULIN ASPART 100 UNIT/ML ~~LOC~~ SOLN
0.0000 [IU] | Freq: Every day | SUBCUTANEOUS | Status: DC
  Administered 2014-09-15: 5 [IU] via SUBCUTANEOUS

## 2014-09-15 MED ORDER — FUROSEMIDE 40 MG PO TABS
40.0000 mg | ORAL_TABLET | Freq: Two times a day (BID) | ORAL | Status: DC
  Administered 2014-09-15 – 2014-09-18 (×6): 40 mg via ORAL
  Filled 2014-09-15 (×10): qty 1

## 2014-09-15 MED ORDER — ENOXAPARIN SODIUM 40 MG/0.4ML ~~LOC~~ SOLN
40.0000 mg | SUBCUTANEOUS | Status: DC
  Administered 2014-09-15 – 2014-09-17 (×3): 40 mg via SUBCUTANEOUS
  Filled 2014-09-15 (×6): qty 0.4

## 2014-09-15 MED ORDER — SODIUM CHLORIDE 0.9 % IV SOLN: INTRAVENOUS | Status: DC

## 2014-09-15 MED ORDER — ATORVASTATIN CALCIUM 40 MG PO TABS
40.0000 mg | ORAL_TABLET | Freq: Every day | ORAL | Status: DC
  Administered 2014-09-15 – 2014-09-18 (×4): 40 mg via ORAL
  Filled 2014-09-15 (×5): qty 1

## 2014-09-15 MED ORDER — VENLAFAXINE HCL ER 75 MG PO CP24
75.0000 mg | ORAL_CAPSULE | Freq: Two times a day (BID) | ORAL | Status: DC
  Administered 2014-09-15 – 2014-09-18 (×6): 75 mg via ORAL
  Filled 2014-09-15 (×7): qty 1

## 2014-09-15 MED ORDER — IPRATROPIUM-ALBUTEROL 0.5-2.5 (3) MG/3ML IN SOLN
3.0000 mL | Freq: Once | RESPIRATORY_TRACT | Status: AC
  Administered 2014-09-15: 3 mL via RESPIRATORY_TRACT
  Filled 2014-09-15: qty 3

## 2014-09-15 MED ORDER — PROMETHAZINE HCL 25 MG PO TABS: 12.5000 mg | ORAL_TABLET | Freq: Four times a day (QID) | ORAL | Status: DC | PRN

## 2014-09-15 MED ORDER — SODIUM CHLORIDE 0.9 % IV SOLN
INTRAVENOUS | Status: DC
  Administered 2014-09-15: 50 mL via INTRAVENOUS
  Administered 2014-09-15: 50 mL/h via INTRAVENOUS

## 2014-09-15 MED ORDER — IPRATROPIUM-ALBUTEROL 0.5-2.5 (3) MG/3ML IN SOLN
3.0000 mL | RESPIRATORY_TRACT | Status: DC
  Administered 2014-09-15 – 2014-09-16 (×4): 3 mL via RESPIRATORY_TRACT
  Filled 2014-09-15 (×4): qty 3

## 2014-09-15 MED ORDER — CARVEDILOL 25 MG PO TABS
25.0000 mg | ORAL_TABLET | Freq: Two times a day (BID) | ORAL | Status: DC
  Administered 2014-09-15 – 2014-09-18 (×6): 25 mg via ORAL
  Filled 2014-09-15 (×10): qty 1

## 2014-09-15 MED ORDER — ASPIRIN EC 81 MG PO TBEC
81.0000 mg | DELAYED_RELEASE_TABLET | Freq: Every day | ORAL | Status: DC
  Administered 2014-09-16 – 2014-09-18 (×3): 81 mg via ORAL
  Filled 2014-09-15 (×4): qty 1

## 2014-09-15 MED ORDER — INSULIN ASPART 100 UNIT/ML ~~LOC~~ SOLN
0.0000 [IU] | Freq: Three times a day (TID) | SUBCUTANEOUS | Status: DC
  Administered 2014-09-16: 15 [IU] via SUBCUTANEOUS
  Administered 2014-09-16: 8 [IU] via SUBCUTANEOUS

## 2014-09-15 MED ORDER — BUPROPION HCL ER (XL) 300 MG PO TB24
300.0000 mg | ORAL_TABLET | Freq: Every day | ORAL | Status: DC
  Administered 2014-09-16 – 2014-09-18 (×3): 300 mg via ORAL
  Filled 2014-09-15 (×3): qty 1

## 2014-09-15 MED ORDER — HYDRALAZINE HCL 20 MG/ML IJ SOLN
10.0000 mg | Freq: Four times a day (QID) | INTRAMUSCULAR | Status: DC | PRN
  Filled 2014-09-15: qty 1

## 2014-09-15 MED ORDER — METHYLPREDNISOLONE SODIUM SUCC 125 MG IJ SOLR
125.0000 mg | Freq: Two times a day (BID) | INTRAMUSCULAR | Status: DC
  Filled 2014-09-15: qty 2

## 2014-09-15 MED ORDER — PAROXETINE MESYLATE 7.5 MG PO CAPS: 7.5000 mg | ORAL_CAPSULE | Freq: Every day | ORAL | Status: DC

## 2014-09-15 MED ORDER — INSULIN GLARGINE 100 UNIT/ML ~~LOC~~ SOLN
35.0000 [IU] | Freq: Every day | SUBCUTANEOUS | Status: DC
  Administered 2014-09-15 – 2014-09-16 (×2): 35 [IU] via SUBCUTANEOUS
  Filled 2014-09-15 (×3): qty 0.35

## 2014-09-15 MED ORDER — METHYLPREDNISOLONE SODIUM SUCC 125 MG IJ SOLR
125.0000 mg | Freq: Once | INTRAMUSCULAR | Status: AC
  Administered 2014-09-15: 125 mg via INTRAVENOUS
  Filled 2014-09-15: qty 2

## 2014-09-15 MED ORDER — BUDESONIDE 0.25 MG/2ML IN SUSP
0.2500 mg | Freq: Two times a day (BID) | RESPIRATORY_TRACT | Status: DC
  Administered 2014-09-15 – 2014-09-18 (×5): 0.25 mg via RESPIRATORY_TRACT
  Filled 2014-09-15 (×8): qty 2

## 2014-09-15 MED ORDER — PANTOPRAZOLE SODIUM 40 MG PO TBEC
40.0000 mg | DELAYED_RELEASE_TABLET | Freq: Every day | ORAL | Status: DC
  Administered 2014-09-16 – 2014-09-18 (×3): 40 mg via ORAL
  Filled 2014-09-15 (×3): qty 1

## 2014-09-15 MED ORDER — ESTRADIOL 2 MG PO TABS
2.0000 mg | ORAL_TABLET | Freq: Every day | ORAL | Status: DC
  Administered 2014-09-16 – 2014-09-18 (×3): 2 mg via ORAL
  Filled 2014-09-15 (×3): qty 1

## 2014-09-15 NOTE — H&P (Signed)
History and Physical    [name] EQA:[id number] DOB: November 27, 1960 DOA: 09/15/2014  Referring physician: Dr. Leonides Schanz PCP: Garret Reddish, MD  Specialists: none   Chief Complaint: Shortness of breath  HPI: [name] is a 53 y.o. female has a past medical history significant for hypertension, hyperlipidemia, type 2 diabetes mellitus, pulmonary sarcoidosis followed at [facility], chronic diastolic heart failure, COPD, presents to the emergency room with the chief complaint of shortness of breath. She has noticed since last 3-4 days ago dry cough, yesterday started to having mild wheezing, and progress today to significant shortness of breath that determined her to present to the emergency room. She denies any fever or chills, however endorses sinus pressure and mild congestion. She denies any sore throat. Her cough was dry, however during the ED she started having sputum production. She denies any chest pain. She endorses mild abdominal pain worse with coughing, denies any nausea vomiting or diarrhea. In the emergency room, patient was quite tachypneic and with significant wheezing. An ABG showed 7.4/39/74 on room air. Renal function is normal, she has mild leukocytosis of 10.9, and mild anemia with a hemoglobin of 11.4. TRH asked for admission for asthmatic bronchitic exacerbation.  Review of Systems: As per history of present illness, otherwise negative  Past Medical History  Diagnosis Date  . DIABETES MELLITUS, TYPE II 08/21/2006  . HYPERLIPIDEMIA 08/21/2006  . GOUT 08/20/2010  . OBESITY 06/04/2009  . ANEMIA-UNSPECIFIED 09/18/2009  . HYPERTENSION 08/21/2006  . GERD 08/21/2006  . SLEEP APNEA 04/21/2009    last testing was negative  . Internal hemorrhoids   . Pulmonary sarcoidosis     Followed locally by pulmonology, but also by Dr. Casper Harrison at [facility]  . Exertional chest pain     sharp, substernal, exertional  . Vocal cord dysfunction   . Chronic diastolic heart  failure, NYHA class 2     LVEDP roughly 20% by cath  . CHF (congestive heart failure)   . COPD (chronic obstructive pulmonary disease)   . Depression   . Fracture of 5th metatarsal     non union  . Abnormal SPEP 04/17/2014   Past Surgical History  Procedure Laterality Date  . Ventral hernia repair    . Nissen fundoplication  9798  . Cholecystectomy  1984  . Abdominal hysterectomy    . Knee arthroscopy      right  . Tubal ligation      with reversal in 1994  . Bladder suspension  11/11/2011    Procedure: TRANSVAGINAL TAPE (TVT) PROCEDURE;  Surgeon: Olga Millers, MD;  Location: [facility];  Service: Gynecology;  Laterality: N/A;  . Cystoscopy  11/11/2011    Procedure: CYSTOSCOPY;  Surgeon: Olga Millers, MD;  Location: [facility];  Service: Gynecology;  Laterality: N/A;  . Doppler echocardiography  02/12/2013    LV FUNCTION, SIZE NORMAL; MILD CONCENTRIC LVH; EST EF 55-65%; WALL MOTION NORMAL  . Lexiscan myoview  03/09/2013    EF 50%; NORMAL MYOCARDIAL PERFUSION STUDY - breast attenuation  . Cardiac catheterization  07/2010    LVEF 50-55% WITH VERY MILD GLOBAL HYPOKINESIA; ESSENTIALLY NORMAL CORONARY ARTERIES; NORMAL LV FUNCTION  . Carotids  02/18/11    CAROTID DUPLEX; VERTEBRALS ARE PATENT WITH ANTEGRADE FLOW. ICA/CCA RATIO 1.61 ON RIGHT AND 0.75 ON LEFT  . Right and left cardiac catheterization  04/23/2013    Angiographic normal coronaries; LVEDP 20 mmHg, PCWP 12-14 mmHg, RAP 12 mmHg.; Fick CO/CI 4.9/2.2  . Metatarsal osteotomy  with open reduction internal fixation (orif) metatarsal with fusion Left 04/09/2014    Procedure: LEFT FOOT FRACTURE OPEN TREATMENT METATARSAL INCLUDES INTERNAL FIXATION EACH;  Surgeon: Lorn Junes, MD;  Location: [facility];  Service: Orthopedics;  Laterality: Left;   Social History:  reports that she has never smoked. She has never used smokeless tobacco. She reports that she does not drink alcohol or use illicit drugs.  Allergies  Allergen  Reactions  . Lisinopril Cough  . Methotrexate     REACTION: peri-oral and buccal lesions.  . Clindamycin/Lincomycin Nausea And Vomiting and Rash    Family History  Problem Relation Age of Onset  . Diabetes Father   . Heart attack Father   . Coronary artery disease Father   . Heart failure Father   . COPD Mother   . Emphysema Mother   . Asthma Mother   . Heart failure Mother   . Sarcoidosis Maternal Uncle   . Colon cancer Neg Hx   . Lung cancer Brother   . Cancer Brother   . Diabetes Brother   . Heart attack Maternal Grandfather     Prior to Admission medications   Medication Sig Start Date End Date Taking? Authorizing Provider  albuterol (PROVENTIL HFA;VENTOLIN HFA) 108 (90 BASE) MCG/ACT inhaler Inhale 1-2 puffs into the lungs every 6 (six) hours as needed for wheezing or shortness of breath. For wheezing.   Yes Historical Provider, MD  albuterol (PROVENTIL) (5 MG/ML) 0.5% nebulizer solution Take 2.5 mg by nebulization every 6 (six) hours as needed. For wheezing.   Yes Historical Provider, MD  allopurinol (ZYLOPRIM) 100 MG tablet Take 100 mg by mouth daily.    Yes Historical Provider, MD  aspirin EC 81 MG tablet Take 81 mg by mouth daily.    Yes Historical Provider, MD  atorvastatin (LIPITOR) 40 MG tablet Take one tablet by mouth one time daily   Yes Lisabeth Pick, MD  B-D ULTRAFINE III SHORT PEN 31G X 8 MM MISC Follow directions provided by physician 09/13/13  Yes Lisabeth Pick, MD  benzonatate (TESSALON) 200 MG capsule Take 200 mg by mouth as needed for cough.  09/19/13  Yes Historical Provider, MD  Blood Glucose Monitoring Suppl (FREESTYLE FREEDOM LITE) W/DEVICE KIT Use as adviced 12/27/13  Yes Philemon Kingdom, MD  buPROPion (WELLBUTRIN XL) 300 MG 24 hr tablet TAKE ONE TABLET BY MOUTH ONE TIME DAILY  Patient taking differently: TAKE ONE TABLET BY MOUTH ONE TIME DAILY 05/30/14  Yes Bruce Lemmie Evens Swords, MD  CARTIA XT 120 MG 24 hr capsule TAKE ONE CAPSULE BY MOUTH ONCE DAILY IN THE  EVENING  Patient taking differently: TAKE ONE CAPSULE BY MOUTH ONCE DAILY IN THE EVENING 06/14/14  Yes Leonie Man, MD  carvedilol (COREG) 25 MG tablet Take 1 tablet (25 mg total) by mouth 2 (two) times daily with a meal. 10/01/13  Yes Leonie Man, MD  chlorpheniramine-HYDROcodone (TUSSIONEX) 10-8 MG/5ML LQCR Take 5 mLs by mouth every 12 (twelve) hours as needed for cough. 07/15/14  Yes [name], MD  cholecalciferol 5000 UNITS TABS Take 1 tablet (5,000 Units total) by mouth daily. 04/09/14  Yes Kirstin J Shepperson, PA-C  DEXILANT 60 MG capsule TAKE ONE CAPSULE BY MOUTH ONE TIME DAILY  Patient taking differently: TAKE ONE CAPSULE BY MOUTH ONE TIME DAILY   Yes Elsie Stain, MD  estradiol (ESTRACE) 2 MG tablet Take by mouth daily.  01/24/14  Yes Historical Provider, MD  fenofibrate (  TRICOR) 145 MG tablet Take one tablet by mouth one time daily 11/04/13  Yes Leonie Man, MD  furosemide (LASIX) 40 MG tablet Take one tablet by mouth twice daily   Yes Leonie Man, MD  gabapentin (NEURONTIN) 100 MG capsule Take 1 capsule (100 mg total) by mouth 3 (three) times daily. 07/01/14  Yes Philemon Kingdom, MD  glucose blood (ONE TOUCH ULTRA TEST) test strip 1 each by Other route 3 (three) times daily. Use as instructed 10/19/13  Yes Bruce Kendall Flack, MD  insulin aspart (NOVOLOG FLEXPEN) 100 UNIT/ML FlexPen Inject 8-16 Units into the skin 3 (three) times daily with meals. Pens please. 08/30/14  Yes Philemon Kingdom, MD  Insulin Glargine (LANTUS) 100 UNIT/ML Solostar Pen Inject 35 Units into the skin daily at 10 pm. 07/04/14  Yes Philemon Kingdom, MD  LORazepam (ATIVAN) 0.5 MG tablet Take 1 tablet (0.5 mg total) by mouth 2 (two) times daily as needed for anxiety. 11/29/13  Yes Marletta Lor, MD  losartan (COZAAR) 100 MG tablet Take one tablet by mouth one time daily   Yes Leonie Man, MD  metFORMIN (GLUCOPHAGE) 1000 MG tablet Take 1 tablet (1,000 mg total) by mouth 2 (two) times daily with a  meal. 12/27/13  Yes Philemon Kingdom, MD  mometasone-formoterol (DULERA) 200-5 MCG/ACT AERO Inhale 2 puffs into the lungs 2 (two) times daily. 06/15/13  Yes Elsie Stain, MD  nitroGLYCERIN (NITROSTAT) 0.4 MG SL tablet Place 0.4 mg under the tongue every 5 (five) minutes as needed. x3 doses as needed for chest pain.   Yes Historical Provider, MD  PARoxetine Mesylate (BRISDELLE) 7.5 MG CAPS Take 7.5 mg by mouth daily.   Yes Historical Provider, MD  potassium chloride SA (K-DUR,KLOR-CON) 20 MEQ tablet TAKE ONE TABLET BY MOUTH THREE TIMES DAILY  Patient taking differently: TAKE ONE TABLET BY MOUTH THREE TIMES DAILY 08/27/14  Yes [name], MD  predniSONE (DELTASONE) 1 MG tablet Take 2 tablets (2 mg total) by mouth daily with breakfast. 08/26/14  Yes Elsie Stain, MD  predniSONE (DELTASONE) 5 MG tablet Take 5 mg by mouth daily with breakfast. Sig 16ms daily 06/11/14  Yes Historical Provider, MD  promethazine (PHENERGAN) 12.5 MG tablet Take 1 tablet (12.5 mg total) by mouth every 6 (six) hours as needed for nausea or vomiting. 08/21/14  Yes Jessica D. Zehr, PA-C  venlafaxine XR (EFFEXOR-XR) 75 MG 24 hr capsule TAKE ONE CAPSULE BY MOUTH TWICE DAILY  Patient taking differently: TAKE ONE CAPSULE BY MOUTH TWICE DAILY   Yes BDarrick PennaSwords, MD  azithromycin (ZITHROMAX) 250 MG tablet Take 250 mg by mouth 3 (three) times a week. Mon, wed, Friday, mornings.    Historical Provider, MD   Physical Exam: Filed Vitals:   09/15/14 1530 09/15/14 1534 09/15/14 1545 09/15/14 1600  BP: 138/57  147/66 159/61  Pulse: 82  82 81  Temp:      TempSrc:      Resp: _0 SpO2: 98% 98% 95% 94%     General:  In mild distress with audible wheezing as I entered the room, has tachypnea flushed face,   Eyes: no scleral icterus  ENT: moist oropharynx  Neck: supple, no JVD  Cardiovascular: regular rate without MRG; 2+ peripheral pulses  Respiratory: significant wheezing throughout bilateral lung fields,  moves air well  Abdomen: soft, non tender to palpation  Skin: no rashes  Musculoskeletal: no peripheral edema  Psychiatric: normal mood and affect  Neurologic: Nonfocal   Labs on Admission:  Basic Metabolic Panel:  Recent Labs Lab 09/15/14 1520  NA 136*  K 3.8  CL 95*  CO2 28  GLUCOSE 197*  BUN 12  CREATININE 0.56  CALCIUM 9.6   CBC:  Recent Labs Lab 09/15/14 1520  WBC 10.9*  NEUTROABS 7.1  HGB 11.4*  HCT 35.4*  MCV 88.3  PLT 306   BNP (last 3 results)  Recent Labs  12/02/13 2017 03/13/14 2129 09/15/14 1520  PROBNP 63.5 95.1 47.8   CBG: No results for input(s): GLUCAP in the last 168 hours.  Radiological Exams on Admission: Dg Chest Port 1 View  09/15/2014   CLINICAL DATA:  Nonproductive cough since yesterday, wheezing  EXAM: PORTABLE CHEST - 1 VIEW  COMPARISON:  04/19/2014  FINDINGS: The heart size and mediastinal contours are within normal limits. Both lungs are clear. The visualized skeletal structures are unremarkable.  IMPRESSION: No active disease.   Electronically Signed   By: Kathreen Devoid   On: 09/15/2014 15:47    Assessment/Plan Principal Problem:   Asthmatic bronchitis with acute exacerbation Active Problems:   Sarcoidosis of lung   Obesity   Type II diabetes mellitus with neurological manifestations, uncontrolled   Chronic diastolic heart failure, NYHA class 2   AP (abdominal pain)   Asthmatic bronchitis with acute exacerbation  - Patient is quite tachypnea and has significant wheezing in the emergency room. We'll admit her to step down overnight for closer monitoring. She has a component of upper airway stridor/vocal cord dysfunction as well.  - Start IV steroids - Pulmicort and DuoNeb's scheduled  - Start levofloxacin given possible purulent sputum production  - Rule out flu given symptoms preceding her acute exacerbation   Chronic diastolic heart failure - she recently had a 2-D echo about 2 weeks ago which showed EF of 55-60%  and grade 1 diastolic dysfunction. BNP currently is within normal limits and she is not fluid overloaded. - Resume her Lasix in the morning  Type 2 diabetes mellitus - Followed by endocrinology as an outpatient, restart her Lantus as well as sliding scale insulin. We'll closely monitor her sugars and she will be on steroids.   Hypertension - continue home medications  Hyperlipidemia - continue statin, continue fibrate  Neuropathy - continue gabapentin  Lung sarcoidosis - followed up as an outpatient at [facility]  Depression - continue home medications   Diet: carb modified Fluids: NS DVT Prophylaxis: lovenox  Code Status: Full  Family Communication: d/w husband bedside  Disposition Plan: admit to SDU  Time spent: 91  Costin M. Cruzita Lederer, MD [facility] Pager [phone]  If 7PM-7AM, please contact night-coverage [email or web address] Password [facility] 09/15/2014, 5:37 PM

## 2014-09-15 NOTE — Plan of Care (Signed)
Problem: Phase I Progression Outcomes Goal: O2 sats > or equal 90% or at baseline Outcome: Completed/Met Date Met:  09/15/14 Goal: Hemodynamically stable Outcome: Completed/Met Date Met:  09/15/14 Goal: Pain controlled Outcome: Completed/Met Date Met:  09/15/14 Goal: Discharge plan established Outcome: Completed/Met Date Met:  09/15/14 Goal: Tolerating diet Outcome: Completed/Met Date Met:  09/15/14

## 2014-09-15 NOTE — ED Notes (Signed)
Pt reports having non productive cough since yesterday, audible wheezing noted at triage, spo2 96%. Pt has hx of sarcoidosis.

## 2014-09-15 NOTE — ED Notes (Signed)
Admit Doctor at bedside. Stated does not need blood cultures and able to start antibiotics. Will test for the flu placed on droplet precautions.

## 2014-09-15 NOTE — ED Notes (Signed)
Respiratory at bedside.

## 2014-09-15 NOTE — ED Provider Notes (Signed)
TIME SEEN: 3:15 PM  CHIEF COMPLAINT: Shortness of breath, wheezing  HPI: [name] a 53 y.o. F with history of hypertension, hyperlipidemia, diabetes, pulmonary sarcoidosis, CHF, COPD who presents to the emergency department with shortness of breath and wheezing that started yesterday. She also has a nonproductive cough. No fevers or chills. No chest pain. Does not wear oxygen at home. No history of PE or DVT. She has noticed bilateral lower extremity swelling over the past several days.  Patient denies a history of prior tobacco use.  PCP is Dr. Yong Channel with [facility] Pulmonologist is Dr. Joya Gaskins  ROS: See HPI Constitutional: no fever  Eyes: no drainage  ENT: no runny nose   Cardiovascular:  no chest pain  Resp:  SOB  GI: no vomiting GU: no dysuria Integumentary: no rash  Allergy: no hives  Musculoskeletal: no leg swelling  Neurological: no slurred speech ROS otherwise negative  PAST MEDICAL HISTORY/PAST SURGICAL HISTORY:  Past Medical History  Diagnosis Date  . DIABETES MELLITUS, TYPE II 08/21/2006  . HYPERLIPIDEMIA 08/21/2006  . GOUT 08/20/2010  . OBESITY 06/04/2009  . ANEMIA-UNSPECIFIED 09/18/2009  . HYPERTENSION 08/21/2006  . GERD 08/21/2006  . SLEEP APNEA 04/21/2009    last testing was negative  . Internal hemorrhoids   . Pulmonary sarcoidosis     Followed locally by pulmonology, but also by Dr. Casper Harrison at [facility]  . Exertional chest pain     sharp, substernal, exertional  . Vocal cord dysfunction   . Chronic diastolic heart failure, NYHA class 2     LVEDP roughly 20% by cath  . CHF (congestive heart failure)   . COPD (chronic obstructive pulmonary disease)   . Depression   . Fracture of 5th metatarsal     non union  . Abnormal SPEP 04/17/2014    MEDICATIONS:  Prior to Admission medications   Medication Sig Start Date End Date Taking? Authorizing Provider  albuterol (PROVENTIL HFA;VENTOLIN HFA) 108 (90 BASE) MCG/ACT inhaler Inhale 2 puffs into the lungs  every 6 (six) hours as needed. For wheezing.    Historical Provider, MD  albuterol (PROVENTIL) (5 MG/ML) 0.5% nebulizer solution Take 2.5 mg by nebulization every 6 (six) hours as needed. For wheezing.    Historical Provider, MD  allopurinol (ZYLOPRIM) 100 MG tablet Take 100 mg by mouth at bedtime.     Historical Provider, MD  aspirin EC 81 MG tablet Take 81 mg by mouth daily.     Historical Provider, MD  atorvastatin (LIPITOR) 40 MG tablet Take one tablet by mouth one time daily    Bruce Kendall Flack, MD  azithromycin (ZITHROMAX) 250 MG tablet Take 250 mg by mouth 3 (three) times a week.    Historical Provider, MD  B-D ULTRAFINE III SHORT PEN 31G X 8 MM MISC Follow directions provided by physician 09/13/13   Lisabeth Pick, MD  benzonatate (TESSALON) 200 MG capsule Take 200 mg by mouth. 09/19/13   Historical Provider, MD  Blood Glucose Monitoring Suppl (FREESTYLE FREEDOM LITE) W/DEVICE KIT Use as adviced 12/27/13   Philemon Kingdom, MD  buPROPion (WELLBUTRIN XL) 300 MG 24 hr tablet TAKE ONE TABLET BY MOUTH ONE TIME DAILY  05/30/14   Lisabeth Pick, MD  CARTIA XT 120 MG 24 hr capsule TAKE ONE CAPSULE BY MOUTH ONCE DAILY IN THE EVENING  06/14/14   Leonie Man, MD  carvedilol (COREG) 25 MG tablet Take 1 tablet (25 mg total) by mouth 2 (two) times daily with a meal.  10/01/13   Leonie Man, MD  chlorpheniramine-HYDROcodone (TUSSIONEX) 10-8 MG/5ML [facility] Take 5 mLs by mouth every 12 (twelve) hours as needed for cough. 07/15/14   [name], MD  cholecalciferol 5000 UNITS TABS Take 1 tablet (5,000 Units total) by mouth daily. 04/09/14   Kirstin J Shepperson, PA-C  DEXILANT 60 MG capsule TAKE ONE CAPSULE BY MOUTH ONE TIME DAILY     Elsie Stain, MD  estradiol (ESTRACE) 2 MG tablet Take by mouth daily.  01/24/14   Historical Provider, MD  fenofibrate (TRICOR) 145 MG tablet Take one tablet by mouth one time daily 11/04/13   Leonie Man, MD  furosemide (LASIX) 40 MG tablet Take one tablet by mouth  twice daily    Leonie Man, MD  gabapentin (NEURONTIN) 100 MG capsule Take 1 capsule (100 mg total) by mouth 3 (three) times daily. 07/01/14   Philemon Kingdom, MD  glucose blood (ONE TOUCH ULTRA TEST) test strip 1 each by Other route 3 (three) times daily. Use as instructed 10/19/13   Lisabeth Pick, MD  insulin aspart (NOVOLOG FLEXPEN) 100 UNIT/ML FlexPen Inject 8-16 Units into the skin 3 (three) times daily with meals. Pens please. 08/30/14   Philemon Kingdom, MD  Insulin Glargine (LANTUS) 100 UNIT/ML Solostar Pen Inject 35 Units into the skin daily at 10 pm. 07/04/14   Philemon Kingdom, MD  LORazepam (ATIVAN) 0.5 MG tablet Take 1 tablet (0.5 mg total) by mouth 2 (two) times daily as needed for anxiety. 11/29/13   Marletta Lor, MD  losartan (COZAAR) 100 MG tablet Take one tablet by mouth one time daily    Leonie Man, MD  metFORMIN (GLUCOPHAGE) 1000 MG tablet Take 1 tablet (1,000 mg total) by mouth 2 (two) times daily with a meal. 12/27/13   Philemon Kingdom, MD  mometasone-formoterol (DULERA) 200-5 MCG/ACT AERO Inhale 2 puffs into the lungs 2 (two) times daily. 06/15/13   Elsie Stain, MD  nitroGLYCERIN (NITROSTAT) 0.4 MG SL tablet Place 0.4 mg under the tongue every 5 (five) minutes as needed. x3 doses as needed for chest pain.    Historical Provider, MD  PARoxetine Mesylate (BRISDELLE) 7.5 MG CAPS Take 7.5 mg by mouth daily.    Historical Provider, MD  potassium chloride SA (K-DUR,KLOR-CON) 20 MEQ tablet TAKE ONE TABLET BY MOUTH THREE TIMES DAILY  08/27/14   [name], MD  predniSONE (DELTASONE) 1 MG tablet Take 2 tablets (2 mg total) by mouth daily with breakfast. 08/26/14   Elsie Stain, MD  predniSONE (DELTASONE) 5 MG tablet Take 5 mg by mouth daily with breakfast. Sig 22ms daily 06/11/14   Historical Provider, MD  promethazine (PHENERGAN) 12.5 MG tablet Take 1 tablet (12.5 mg total) by mouth every 6 (six) hours as needed for nausea or vomiting. 08/21/14   JLaban Emperor Zehr,  PA-C  venlafaxine XR (EFFEXOR-XR) 75 MG 24 hr capsule TAKE ONE CAPSULE BY MOUTH TWICE DAILY     BLisabeth Pick MD    ALLERGIES:  Allergies  Allergen Reactions  . Lisinopril Cough  . Methotrexate     REACTION: peri-oral and buccal lesions.  . Clindamycin/Lincomycin Nausea And Vomiting and Rash    SOCIAL HISTORY:  History  Substance Use Topics  . Smoking status: Never Smoker   . Smokeless tobacco: Never Used  . Alcohol Use: No    FAMILY HISTORY: Family History  Problem Relation Age of Onset  . Diabetes Father   . Heart attack  Father   . Coronary artery disease Father   . Heart failure Father   . COPD Mother   . Emphysema Mother   . Asthma Mother   . Heart failure Mother   . Sarcoidosis Maternal Uncle   . Colon cancer Neg Hx   . Lung cancer Brother   . Cancer Brother   . Diabetes Brother   . Heart attack Maternal Grandfather     EXAM: BP 146/75 mmHg  Pulse 95  Temp(Src) 98.1 F (36.7 C) (Oral)  Resp 22  SpO2 96% CONSTITUTIONAL: Alert and oriented and responds appropriately to questions.  well-nourished, obese, in moderate respiratory distress HEAD: Normocephalic EYES: Conjunctivae clear, PERRL ENT: normal nose; no rhinorrhea; moist mucous membranes; pharynx without lesions noted NECK: Supple, no meningismus, no LAD  CARD: RRR; S1 and S2 appreciated; no murmurs, no clicks, no rubs, no gallops RESP: Normal chest excursion without splinting or tachypnea; breath sounds clear and equal bilaterally; no wheezes, no rhonchi, no rales, patient is tachypneic and in moderate respiratory distress, speaking only several words at a time, no hypoxia however, diminished aeration diffusely with minimal exotropia wheezing, no rhonchi or rales  ABD/GI: Normal bowel sounds; non-distended; soft, non-tender, no rebound, no guarding BACK:  The back appears normal and is non-tender to palpation, there is no CVA tenderness EXT: Normal ROM in all joints; non-tender to palpation; bilateral  lower extremity edema; normal capillary refill; no cyanosis    SKIN: Normal color for age and race; warm NEURO: Moves all extremities equally PSYCH: The patient's mood and manner are appropriate. Grooming and personal hygiene are appropriate.  MEDICAL DECISION MAKING: Patient here with shortness of breath, wheezing. Concern for COPD exacerbation, CHF exacerbation, possible pneumonia. No prior history of PE or DVT. We'll give continuous albuterol, Solu-Medrol. We'll obtain labs, chest x-ray. We'll obtain ABG.  ED PROGRESS: Patient has had some improvement after continuous albuterol. She now is coughing up yellow sputum. Chest x-ray clear. Troponin negative. BNP normal. We'll give DuoNeb treatment. Given she is still having some mild respiratory distress will admit to hospitalist service. Given she does have increased work of breathing, speaking short sentences and feel she is most appropriate first step down. Discussed with Dr. Cruzita Lederer for admission.  Given her cough with yellow sputum production will start Levaquin per hospitalist recommendation.    CRITICAL CARE Performed by: Nyra Jabs   Total critical care time: 45 minutes  Critical care time was exclusive of separately billable procedures and treating other patients.  Critical care was necessary to treat or prevent imminent or life-threatening deterioration.  Critical care was time spent personally by me on the following activities: development of treatment plan with patient and/or surrogate as well as nursing, discussions with consultants, evaluation of patient's response to treatment, examination of patient, obtaining history from patient or surrogate, ordering and performing treatments and interventions, ordering and review of laboratory studies, ordering and review of radiographic studies, pulse oximetry and re-evaluation of patient's condition.     Date: 09/15/2014 15:32  Rate: 81  Rhythm: normal sinus rhythm  QRS Axis:  normal  Intervals: normal  ST/T Wave abnormalities: normal  Conduction Disutrbances: none  Narrative Interpretation: unremarkable; no ischemic changes or arrhythmia      [place], DO 09/15/14 1719

## 2014-09-16 DIAGNOSIS — I10 Essential (primary) hypertension: Secondary | ICD-10-CM

## 2014-09-16 DIAGNOSIS — E669 Obesity, unspecified: Secondary | ICD-10-CM

## 2014-09-16 LAB — GLUCOSE, CAPILLARY
Glucose-Capillary: 259 mg/dL — ABNORMAL HIGH (ref 70–99)
Glucose-Capillary: 376 mg/dL — ABNORMAL HIGH (ref 70–99)
Glucose-Capillary: 255 mg/dL — ABNORMAL HIGH (ref 70–99)
Glucose-Capillary: 194 mg/dL — ABNORMAL HIGH (ref 70–99)

## 2014-09-16 LAB — BASIC METABOLIC PANEL
Anion gap: 18 — ABNORMAL HIGH (ref 5–15)
BUN: 14 mg/dL (ref 6–23)
CO2: 23 mEq/L (ref 19–32)
Calcium: 9.1 mg/dL (ref 8.4–10.5)
Chloride: 93 mEq/L — ABNORMAL LOW (ref 96–112)
Creatinine, Ser: 0.55 mg/dL (ref 0.50–1.10)
GFR calc Af Amer: >90 mL/min (ref >90)
GFR calc non Af Amer: >90 mL/min (ref >90)
Glucose, Bld: 390 mg/dL — ABNORMAL HIGH (ref 70–99)
Potassium: 4.7 mEq/L (ref 3.7–5.3)
Sodium: 134 mEq/L — ABNORMAL LOW (ref 137–147)

## 2014-09-16 LAB — CBC
HCT: 33.3 % — ABNORMAL LOW (ref 36.0–46.0)
Hemoglobin: 10.8 g/dL — ABNORMAL LOW (ref 12.0–15.0)
MCH: 28.3 pg (ref 26.0–34.0)
MCHC: 32.4 g/dL (ref 30.0–36.0)
MCV: 87.4 fL (ref 78.0–100.0)
Platelets: 267 10*3/uL (ref 150–400)
RBC: 3.81 MIL/uL — ABNORMAL LOW (ref 3.87–5.11)
RDW: 13.7 % (ref 11.5–15.5)
WBC: 9.7 10*3/uL (ref 4.0–10.5)

## 2014-09-16 LAB — HEMOGLOBIN A1C
Hgb A1c MFr Bld: 9.2 % — ABNORMAL HIGH (ref <5.7)
Mean Plasma Glucose: 217 mg/dL — ABNORMAL HIGH (ref <117)

## 2014-09-16 LAB — INFLUENZA PANEL BY PCR (TYPE A & B)
H1N1 flu by pcr: NOT DETECTED
Influenza A By PCR: NEGATIVE
Influenza B By PCR: NEGATIVE

## 2014-09-16 MED ORDER — IPRATROPIUM-ALBUTEROL 0.5-2.5 (3) MG/3ML IN SOLN
3.0000 mL | Freq: Four times a day (QID) | RESPIRATORY_TRACT | Status: DC
  Administered 2014-09-16 – 2014-09-18 (×8): 3 mL via RESPIRATORY_TRACT
  Filled 2014-09-16 (×9): qty 3

## 2014-09-16 MED ORDER — LEVOFLOXACIN 500 MG PO TABS
500.0000 mg | ORAL_TABLET | Freq: Every day | ORAL | Status: DC
  Administered 2014-09-16 – 2014-09-18 (×3): 500 mg via ORAL
  Filled 2014-09-16 (×3): qty 1

## 2014-09-16 MED ORDER — INSULIN ASPART 100 UNIT/ML ~~LOC~~ SOLN
0.0000 [IU] | Freq: Every day | SUBCUTANEOUS | Status: DC
  Administered 2014-09-16: 2 [IU] via SUBCUTANEOUS
  Administered 2014-09-17: 3 [IU] via SUBCUTANEOUS

## 2014-09-16 MED ORDER — INSULIN ASPART 100 UNIT/ML ~~LOC~~ SOLN
0.0000 [IU] | Freq: Three times a day (TID) | SUBCUTANEOUS | Status: DC
  Administered 2014-09-16: 11 [IU] via SUBCUTANEOUS
  Administered 2014-09-17: 20 [IU] via SUBCUTANEOUS
  Administered 2014-09-17: 15 [IU] via SUBCUTANEOUS
  Administered 2014-09-17 – 2014-09-18 (×3): 11 [IU] via SUBCUTANEOUS

## 2014-09-16 MED ORDER — BENZONATATE 100 MG PO CAPS
200.0000 mg | ORAL_CAPSULE | ORAL | Status: DC | PRN
  Administered 2014-09-17 (×2): 200 mg via ORAL
  Filled 2014-09-16 (×2): qty 2

## 2014-09-16 MED ORDER — METHYLPREDNISOLONE SODIUM SUCC 125 MG IJ SOLR
80.0000 mg | Freq: Two times a day (BID) | INTRAMUSCULAR | Status: DC
  Administered 2014-09-16 – 2014-09-17 (×2): 80 mg via INTRAVENOUS
  Filled 2014-09-16: qty 1.28
  Filled 2014-09-16: qty 2
  Filled 2014-09-16 (×2): qty 1.28

## 2014-09-16 NOTE — Progress Notes (Signed)
Inpatient Diabetes Program Recommendations  AACE/ADA: New Consensus Statement on Inpatient Glycemic Control (2013)  Target Ranges:  Prepandial:   less than 140 mg/dL      Peak postprandial:   less than 180 mg/dL (1-2 hours)      Critically ill patients:  140 - 180 mg/dL  Results for [name], [name] (MRN [id number]) as of 09/16/2014 10:52  Ref. Range 09/15/2014 21:16 09/16/2014 07:37  Glucose-Capillary Latest Range: 70-99 mg/dL 376 (H) 259 (H)   Inpatient Diabetes Program Recommendations Correction (SSI): increase to RESISTANT scale during steroid therapy Thank you  Raoul Pitch BSN, RN,CDE Inpatient Diabetes Coordinator [phone] (team pager)

## 2014-09-16 NOTE — Care Management Note (Signed)
    Page 1 of 1   09/16/2014     9:55:28 AM CARE MANAGEMENT NOTE 09/16/2014  Patient:  [name], [name]   Account Number:  [id number]  Date Initiated:  09/16/2014  Documentation initiated by:  Elissa Hefty  Subjective/Objective Assessment:   adm w asthmatic bronchitis     Action/Plan:   lives w fam, pcp dr Remo Lipps hunter   Anticipated DC Date:  09/19/2014   Anticipated DC Plan:  HOME/SELF CARE         Choice offered to / List presented to:             Status of service:   Medicare Important Message given?   (If response is "NO", the following Medicare IM given date fields will be blank) Date Medicare IM given:   Medicare IM given by:   Date Additional Medicare IM given:   Additional Medicare IM given by:    Discharge Disposition:    Per UR Regulation:  Reviewed for med. necessity/level of care/duration of stay  If discussed at [place] of Stay Meetings, dates discussed:    Comments:

## 2014-09-16 NOTE — Progress Notes (Signed)
[name] 1 - Stepdown / ICU Progress Note  [name] EXN:[id number] DOB: [birth year] DOA: 09/15/2014 PCP: Garret Reddish, MD   Brief narrative: 53 y.o. F w/ a history significant for hypertension, hyperlipidemia, type 2 diabetes mellitus, pulmonary sarcoidosis followed at [facility], chronic diastolic heart failure, and COPD who presented to the emergency room with complaint of shortness of breath onset about 3-4 days prior to arrival. This cough was described as a dry cough that progressed to wheezing then significant shortness of breath on the date of admission. She endorsed mild abdominal pain that was worse with coughing. She does have a history of chronic cough.   In the emergency room she was quite tachypneic with significant wheezing. An ABG showed 7.4/39/74 on room air. Renal function was normal. She had mild leukocytosis of 10.9, and mild anemia with a hemoglobin of 11.4. She was not hypoxic but had decreased air movement.  HPI/Subjective: Still fatigued; cough somewhat productive. No CP, f/c, n/v, or abdom pain.    Assessment/Plan:    Sarcoidosis of lung -followed as an OP at [facility] -on chronic Prednisone which has been transitioned to IV Solumedrol for acute wheezing    Asthmatic bronchitis with acute exacerbation -stable on RA -cont current dose solumedrol -has upper air way wheezing suggestive of VCD -cont supportive care with nebs -cont Levaquin -Influenza panel negative    Essential hypertension -cont home meds -BP controlled    Type II diabetes mellitus with neurological manifestations, uncontrolled -CBGS as high as 496 since admit -certainly influenced by acute illness and higher than baseline steroid dose -home Metformin on hold due to acute illness - uses meal coverage at home -cont home dose Lantus and SSI -HgbA1c was 10.9 in September - repeat since nearly 3 months ago    Chronic diastolic heart failure, NYHA class 2 -compensated -cont home meds  including Lasix    Hyperlipidemia -cont statin    Obesity    Abdominal pain -seems MS in etiology from coughing   DVT prophylaxis: Lovenox Code Status: FULL  Family Communication: husband at bedside  Disposition Plan/Expected LOS: Transfer to med bed   Consultants: None  Procedures: None  Antibiotics: Levaquin 12/6 >  Objective: Blood pressure 153/62, pulse 72, temperature 97.8 F (36.6 C), temperature source Oral, resp. rate 18, height 5\' 6"  (1.676 m), weight 262 lb (118.842 kg), SpO2 97 %.  Intake/Output Summary (Last 24 hours) at 09/16/14 0933 Last data filed at 09/16/14 0400  Gross per 24 hour  Intake 493.33 ml  Output   1100 ml  Net -606.67 ml    Exam: Gen: No acute respiratory distress-does have upper airway wheeze with deep breathing (not present when I entered the room and the pt was reading her paper) Chest: Coarse posteriorly with good airway movement, some exp rhonchi mid fields, able to talk easily, upper airway wheezing noted esp after cough (tight cough), RA Cardiac: Regular rate and rhythm, S1-S2, no rubs murmurs or gallops Abdomen: Soft nontender nondistended without obvious hepatosplenomegaly, no ascites Extremities: no signif c/c/edema B LE   Scheduled Meds:  Scheduled Meds: . allopurinol  100 mg Oral Daily  . aspirin EC  81 mg Oral Daily  . atorvastatin  40 mg Oral Daily  . budesonide (PULMICORT) nebulizer solution  0.25 mg Nebulization BID  . buPROPion  300 mg Oral Daily  . carvedilol  25 mg Oral BID WC  . diltiazem  120 mg Oral Daily  . enoxaparin (LOVENOX) injection  40 mg  Subcutaneous Q24H  . estradiol  2 mg Oral Daily  . fenofibrate  160 mg Oral Daily  . furosemide  40 mg Oral BID  . gabapentin  100 mg Oral TID  . insulin aspart  0-15 Units Subcutaneous TID WC  . insulin aspart  0-5 Units Subcutaneous QHS  . insulin glargine  35 Units Subcutaneous QHS  . ipratropium-albuterol  3 mL Nebulization Q4H  . levofloxacin (LEVAQUIN) IV   500 mg Intravenous Q24H  . losartan  100 mg Oral Daily  . methylPREDNISolone (SOLU-MEDROL) injection  125 mg Intravenous Q12H  . pantoprazole  40 mg Oral Daily  . PARoxetine  10 mg Oral Daily  . sodium chloride  3 mL Intravenous Q12H  . venlafaxine XR  75 mg Oral BID   Data Reviewed: Basic Metabolic Panel:  Recent Labs Lab 09/15/14 1520 09/16/14 0245  NA 136* 134*  K 3.8 4.7  CL 95* 93*  CO2 28 23  GLUCOSE 197* 390*  BUN 12 14  CREATININE 0.56 0.55  CALCIUM 9.6 9.1   Liver Function Tests: No results for input(s): AST, ALT, ALKPHOS, BILITOT, PROT, ALBUMIN in the last 168 hours. No results for input(s): LIPASE, AMYLASE in the last 168 hours. No results for input(s): AMMONIA in the last 168 hours.   CBC:  Recent Labs Lab 09/15/14 1520 09/16/14 0245  WBC 10.9* 9.7  NEUTROABS 7.1  --   HGB 11.4* 10.8*  HCT 35.4* 33.3*  MCV 88.3 87.4  PLT 306 267   CBG:  Recent Labs Lab 09/15/14 2116 09/16/14 0737  GLUCAP 376* 259*    Recent Results (from the past 240 hour(s))  MRSA PCR Screening     Status: None   Collection Time: 09/15/14  6:56 PM  Result Value Ref Range Status   MRSA by PCR NEGATIVE NEGATIVE Final    Comment:        The GeneXpert MRSA Assay (FDA approved for NASAL specimens only), is one component of a comprehensive MRSA colonization surveillance program. It is not intended to diagnose MRSA infection nor to guide or monitor treatment for MRSA infections.      Studies:  Recent x-ray studies have been reviewed in detail by the Attending Physician  Time spent :  [place], ANP [facility] Office  [phone] Pager [phone]  On-Call/Text Page:      [email or web address]      password [email or web address]  If 7PM-7AM, please contact night-coverage [email or web address] Password [email or web address] 09/16/2014, 9:33 AM   LOS: 1 day   I have personally examined this patient and reviewed the entire database. I have reviewed the above note, made any necessary  editorial changes, and agree with its content.  Cherene Altes, MD [facility]

## 2014-09-17 DIAGNOSIS — E1149 Type 2 diabetes mellitus with other diabetic neurological complication: Secondary | ICD-10-CM | POA: Insufficient documentation

## 2014-09-17 DIAGNOSIS — I5032 Chronic diastolic (congestive) heart failure: Secondary | ICD-10-CM | POA: Insufficient documentation

## 2014-09-17 DIAGNOSIS — E1140 Type 2 diabetes mellitus with diabetic neuropathy, unspecified: Secondary | ICD-10-CM

## 2014-09-17 DIAGNOSIS — J42 Unspecified chronic bronchitis: Secondary | ICD-10-CM

## 2014-09-17 DIAGNOSIS — E785 Hyperlipidemia, unspecified: Secondary | ICD-10-CM

## 2014-09-17 DIAGNOSIS — J209 Acute bronchitis, unspecified: Secondary | ICD-10-CM | POA: Insufficient documentation

## 2014-09-17 LAB — GLUCOSE, CAPILLARY
Glucose-Capillary: 292 mg/dL — ABNORMAL HIGH (ref 70–99)
Glucose-Capillary: 315 mg/dL — ABNORMAL HIGH (ref 70–99)
Glucose-Capillary: 352 mg/dL — ABNORMAL HIGH (ref 70–99)
Glucose-Capillary: 282 mg/dL — ABNORMAL HIGH (ref 70–99)

## 2014-09-17 LAB — BASIC METABOLIC PANEL
Anion gap: 19 — ABNORMAL HIGH (ref 5–15)
BUN: 17 mg/dL (ref 6–23)
CO2: 25 mEq/L (ref 19–32)
Calcium: 8.8 mg/dL (ref 8.4–10.5)
Chloride: 97 mEq/L (ref 96–112)
Creatinine, Ser: 0.54 mg/dL (ref 0.50–1.10)
GFR calc Af Amer: >90 mL/min (ref >90)
GFR calc non Af Amer: >90 mL/min (ref >90)
Glucose, Bld: 320 mg/dL — ABNORMAL HIGH (ref 70–99)
Potassium: 4.6 mEq/L (ref 3.7–5.3)
Sodium: 141 mEq/L (ref 137–147)

## 2014-09-17 LAB — CBC
HCT: 36.4 % (ref 36.0–46.0)
Hemoglobin: 11.7 g/dL — ABNORMAL LOW (ref 12.0–15.0)
MCH: 28.8 pg (ref 26.0–34.0)
MCHC: 32.1 g/dL (ref 30.0–36.0)
MCV: 89.7 fL (ref 78.0–100.0)
Platelets: 292 10*3/uL (ref 150–400)
RBC: 4.06 MIL/uL (ref 3.87–5.11)
RDW: 13.6 % (ref 11.5–15.5)
WBC: 9.5 10*3/uL (ref 4.0–10.5)

## 2014-09-17 MED ORDER — KETOROLAC TROMETHAMINE 15 MG/ML IJ SOLN
15.0000 mg | Freq: Three times a day (TID) | INTRAMUSCULAR | Status: DC
  Administered 2014-09-17 – 2014-09-18 (×4): 15 mg via INTRAVENOUS
  Filled 2014-09-17 (×6): qty 1

## 2014-09-17 MED ORDER — INSULIN ASPART 100 UNIT/ML ~~LOC~~ SOLN
4.0000 [IU] | Freq: Three times a day (TID) | SUBCUTANEOUS | Status: DC
  Administered 2014-09-17: 4 [IU] via SUBCUTANEOUS

## 2014-09-17 MED ORDER — INSULIN GLARGINE 100 UNIT/ML ~~LOC~~ SOLN
40.0000 [IU] | Freq: Every day | SUBCUTANEOUS | Status: DC
  Administered 2014-09-17: 40 [IU] via SUBCUTANEOUS
  Filled 2014-09-17 (×2): qty 0.4

## 2014-09-17 MED ORDER — METHYLPREDNISOLONE SODIUM SUCC 40 MG IJ SOLR
40.0000 mg | Freq: Two times a day (BID) | INTRAMUSCULAR | Status: DC
  Administered 2014-09-17: 40 mg via INTRAVENOUS
  Filled 2014-09-17 (×2): qty 1

## 2014-09-17 MED ORDER — INSULIN ASPART 100 UNIT/ML ~~LOC~~ SOLN
8.0000 [IU] | Freq: Three times a day (TID) | SUBCUTANEOUS | Status: DC
  Administered 2014-09-17 – 2014-09-18 (×3): 8 [IU] via SUBCUTANEOUS

## 2014-09-17 NOTE — Progress Notes (Signed)
SATURATION QUALIFICATIONS: (This note is used to comply with regulatory documentation for home oxygen)  Patient Saturations on Room Air at Rest = 96  Patient Saturations on Room Air while Ambulating = 94   

## 2014-09-17 NOTE — Progress Notes (Signed)
[facility]  AFUA HOOTS PXT:[id number] DOB: 1961-07-10 DOA: 09/15/2014 PCP: Garret Reddish, MD   Brief narrative: 53 y.o. WF w/ a history significant for hypertension, hyperlipidemia, type 2 diabetes mellitus, pulmonary sarcoidosis followed at [facility], chronic diastolic heart failure, and COPD who presented to the emergency room with complaint of shortness of breath onset about 3-4 days prior to arrival. This cough was described as a dry cough that progressed to wheezing then significant shortness of breath on the date of admission. She endorsed mild abdominal pain that was worse with coughing. She does have a history of chronic cough.   In the emergency room she was quite tachypneic with significant wheezing. An ABG showed 7.4/39/74 on room air. Renal function was normal. She had mild leukocytosis of 10.9, and mild anemia with a hemoglobin of 11.4. She was not hypoxic but had decreased air movement.  HPI/Subjective: Less SOB but still with paroxsysmal coughing and pleuritic CP that radiates thru chest to left shoulder    Assessment/Plan:    Sarcoidosis of lung -followed as an OP at [facility] -on chronic Prednisone which has been transitioned to IV Solumedrol for acute wheezing    Asthmatic bronchitis with acute exacerbation -stable on RA-check ambulatory sats -decrease solumedrol to 40 mg IV q 12hrs -less upper air way wheezing -wheezing suggestive of VCD -cont supportive care with nebs -cont Levaquin -Influenza panel negative -add scheduled Toradol for pleuritic CP-ck BMET in am -cont Tussionex prn    Essential hypertension -cont home meds -BP controlled    Type II diabetes mellitus with neurological manifestations, uncontrolled -CBGS as high as >300 since admit- add meal coverage (normally uses at home) -certainly influenced by acute illness and higher than baseline steroid dose -home Metformin on hold due to acute illness  -cont home dose  Lantus and resistant SSI -HgbA1c was 10.9 in September - repeat this admission 9.2    Chronic diastolic heart failure, NYHA class 2 -compensated -cont home meds including Lasix    Hyperlipidemia -cont statin    Obesity    Abdominal pain -seems MS in etiology from coughing   DVT prophylaxis: Lovenox Code Status: FULL  Family Communication: No family at bedside Disposition Plan/Expected LOS: Floor bed- hopeful can discharge in am  Consultants: None  Procedures: None  Antibiotics: Levaquin 12/6 >  Objective: Blood pressure 137/70, pulse 85, temperature 98.2 F (36.8 C), temperature source Oral, resp. rate 18, height 5\' 6"  (1.676 m), weight 262 lb (118.842 kg), SpO2 98 %.  Intake/Output Summary (Last 24 hours) at 09/17/14 1107 Last data filed at 09/17/14 6270  Gross per 24 hour  Intake    363 ml  Output   2300 ml  Net  -1937 ml    Exam: Gen: No acute respiratory distress Chest: Coarse posteriorly with good airway movement, some scattered exp rhonchi, able to talk easily, rare exp wheeze, RA Cardiac: Regular rate and rhythm, S1-S2, no rubs murmurs or gallops Abdomen: Soft nontender nondistended without obvious hepatosplenomegaly, no ascites Extremities: no signif c/c/edema B LE   Scheduled Meds:  Scheduled Meds: . allopurinol  100 mg Oral Daily  . aspirin EC  81 mg Oral Daily  . atorvastatin  40 mg Oral Daily  . budesonide (PULMICORT) nebulizer solution  0.25 mg Nebulization BID  . buPROPion  300 mg Oral Daily  . carvedilol  25 mg Oral BID WC  . diltiazem  120 mg Oral Daily  . enoxaparin (LOVENOX) injection  40 mg Subcutaneous Q24H  . estradiol  2 mg Oral Daily  . fenofibrate  160 mg Oral Daily  . furosemide  40 mg Oral BID  . gabapentin  100 mg Oral TID  . insulin aspart  0-20 Units Subcutaneous TID WC  . insulin aspart  0-5 Units Subcutaneous QHS  . insulin aspart  4 Units Subcutaneous TID WC  . insulin glargine  35 Units Subcutaneous QHS  .  ipratropium-albuterol  3 mL Nebulization QID  . ketorolac  15 mg Intravenous 3 times per day  . levofloxacin  500 mg Oral Daily  . losartan  100 mg Oral Daily  . methylPREDNISolone (SOLU-MEDROL) injection  40 mg Intravenous Q12H  . pantoprazole  40 mg Oral Daily  . PARoxetine  10 mg Oral Daily  . sodium chloride  3 mL Intravenous Q12H  . venlafaxine XR  75 mg Oral BID   Data Reviewed: Basic Metabolic Panel:  Recent Labs Lab 09/15/14 1520 09/16/14 0245 09/17/14 0645  NA 136* 134* 141  K 3.8 4.7 4.6  CL 95* 93* 97  CO2 28 23 25   GLUCOSE 197* 390* 320*  BUN 12 14 17   CREATININE 0.56 0.55 0.54  CALCIUM 9.6 9.1 8.8   Liver Function Tests: No results for input(s): AST, ALT, ALKPHOS, BILITOT, PROT, ALBUMIN in the last 168 hours. No results for input(s): LIPASE, AMYLASE in the last 168 hours. No results for input(s): AMMONIA in the last 168 hours.   CBC:  Recent Labs Lab 09/15/14 1520 09/16/14 0245 09/17/14 0645  WBC 10.9* 9.7 9.5  NEUTROABS 7.1  --   --   HGB 11.4* 10.8* 11.7*  HCT 35.4* 33.3* 36.4  MCV 88.3 87.4 89.7  PLT 306 267 292   CBG:  Recent Labs Lab 09/16/14 0737 09/16/14 1133 09/16/14 1700 09/16/14 2129 09/17/14 0755  GLUCAP 259* 376* 255* 194* 292*    Recent Results (from the past 240 hour(s))  MRSA PCR Screening     Status: None   Collection Time: 09/15/14  6:56 PM  Result Value Ref Range Status   MRSA by PCR NEGATIVE NEGATIVE Final    Comment:        The GeneXpert MRSA Assay (FDA approved for NASAL specimens only), is one component of a comprehensive MRSA colonization surveillance program. It is not intended to diagnose MRSA infection nor to guide or monitor treatment for MRSA infections.      Studies:  Recent x-ray studies have been reviewed in detail by the Attending Physician  Time spent :  [place], ANP [facility] Office  [phone] Pager [phone]  On-Call/Text Page:      [email or web address]       password [email or web address]  If 7PM-7AM, please contact night-coverage [email or web address] Password [email or web address] 09/17/2014, 11:07 AM   LOS: 2 days   Examined patient and discussed assessment and plan with ANP Ebony Hail and agree with above plan. Patient with multiple complex medical problems> 40 minutes spent in direct patient care

## 2014-09-17 NOTE — Plan of Care (Signed)
Problem: Phase I Progression Outcomes Goal: Dyspnea controlled at rest Outcome: Completed/Met Date Met:  09/17/14 Goal: Progress activity as tolerated unless otherwise ordered Outcome: Completed/Met Date Met:  09/17/14  Problem: Phase II Progression Outcomes Goal: Pain controlled on oral analgesia Outcome: Completed/Met Date Met:  09/17/14 Goal: Activity at appropriate level-compared to baseline (UP IN CHAIR FOR HEMODIALYSIS)  Outcome: Completed/Met Date Met:  09/17/14 Goal: Tolerating diet Outcome: Completed/Met Date Met:  09/17/14

## 2014-09-18 LAB — BASIC METABOLIC PANEL
Anion gap: 16 — ABNORMAL HIGH (ref 5–15)
BUN: 21 mg/dL (ref 6–23)
CO2: 26 mEq/L (ref 19–32)
Calcium: 9 mg/dL (ref 8.4–10.5)
Chloride: 97 mEq/L (ref 96–112)
Creatinine, Ser: 0.64 mg/dL (ref 0.50–1.10)
GFR calc Af Amer: >90 mL/min (ref >90)
GFR calc non Af Amer: >90 mL/min (ref >90)
Glucose, Bld: 277 mg/dL — ABNORMAL HIGH (ref 70–99)
Potassium: 4.8 mEq/L (ref 3.7–5.3)
Sodium: 139 mEq/L (ref 137–147)

## 2014-09-18 LAB — GLUCOSE, CAPILLARY
Glucose-Capillary: 258 mg/dL — ABNORMAL HIGH (ref 70–99)
Glucose-Capillary: 273 mg/dL — ABNORMAL HIGH (ref 70–99)

## 2014-09-18 MED ORDER — IBUPROFEN 200 MG PO CAPS: 1.0000 | ORAL_CAPSULE | Freq: Three times a day (TID) | ORAL | Status: AC | PRN

## 2014-09-18 MED ORDER — PREDNISONE 20 MG PO TABS
40.0000 mg | ORAL_TABLET | Freq: Every day | ORAL | Status: DC
  Administered 2014-09-18: 40 mg via ORAL
  Filled 2014-09-18: qty 2

## 2014-09-18 MED ORDER — PREDNISONE 10 MG PO TABS: ORAL_TABLET | ORAL | Status: DC

## 2014-09-18 MED ORDER — METFORMIN HCL 500 MG PO TABS
1000.0000 mg | ORAL_TABLET | Freq: Two times a day (BID) | ORAL | Status: DC
  Administered 2014-09-18: 1000 mg via ORAL
  Filled 2014-09-18: qty 2

## 2014-09-18 MED ORDER — PREDNISONE 5 MG PO TABS: 5.0000 mg | ORAL_TABLET | Freq: Every day | ORAL | Status: DC

## 2014-09-18 MED ORDER — BUPROPION HCL ER (XL) 300 MG PO TB24: 300.0000 mg | ORAL_TABLET | Freq: Every day | ORAL | Status: DC

## 2014-09-18 MED ORDER — INSULIN ASPART 100 UNIT/ML ~~LOC~~ SOLN: 12.0000 [IU] | Freq: Three times a day (TID) | SUBCUTANEOUS | Status: DC

## 2014-09-18 MED ORDER — DEXLANSOPRAZOLE 60 MG PO CPDR: 1.0000 | DELAYED_RELEASE_CAPSULE | Freq: Every day | ORAL | Status: DC

## 2014-09-18 MED ORDER — POTASSIUM CHLORIDE CRYS ER 20 MEQ PO TBCR: 20.0000 meq | EXTENDED_RELEASE_TABLET | Freq: Three times a day (TID) | ORAL | Status: DC

## 2014-09-18 MED ORDER — IBUPROFEN 200 MG PO CAPS: 1.0000 | ORAL_CAPSULE | Freq: Three times a day (TID) | ORAL | Status: DC | PRN

## 2014-09-18 MED ORDER — INSULIN GLARGINE 100 UNIT/ML SOLOSTAR PEN: 40.0000 [IU] | PEN_INJECTOR | Freq: Every day | SUBCUTANEOUS | Status: DC

## 2014-09-18 MED ORDER — LEVOFLOXACIN 500 MG PO TABS: 500.0000 mg | ORAL_TABLET | Freq: Every day | ORAL | Status: DC

## 2014-09-18 MED ORDER — DILTIAZEM HCL ER COATED BEADS 120 MG PO CP24: ORAL_CAPSULE | ORAL | Status: DC

## 2014-09-18 MED ORDER — VENLAFAXINE HCL ER 75 MG PO CP24: 75.0000 mg | ORAL_CAPSULE | Freq: Two times a day (BID) | ORAL | Status: DC

## 2014-09-18 NOTE — Plan of Care (Signed)
Problem: Phase I Progression Outcomes Goal: Other Phase II Outcomes/Goals Outcome: Completed/Met Date Met:  09/18/14  Problem: Phase II Progression Outcomes Goal: O2 sats > equal to 90% on RA or at baseline Outcome: Completed/Met Date Met:  09/18/14 Goal: Dyspnea controlled w/progressive activity Outcome: Completed/Met Date Met:  09/18/14 Goal: ADLs completed with minimal assistance Outcome: Completed/Met Date Met:  09/18/14 Goal: Discharge plan remains appropriate-arrangements made Outcome: Completed/Met Date Met:  09/18/14 Goal: Other Phase II Outcomes/Goals Outcome: Completed/Met Date Met:  09/18/14

## 2014-09-18 NOTE — Progress Notes (Signed)
Inpatient Diabetes Program Recommendations  AACE/ADA: New Consensus Statement on Inpatient Glycemic Control (2013)  Target Ranges:  Prepandial:   less than 140 mg/dL      Peak postprandial:   less than 180 mg/dL (1-2 hours)      Critically ill patients:  140 - 180 mg/dL   Results for [name], [name] (MRN [id number]) as of 09/18/2014 11:49  Ref. Range 09/17/2014 07:55 09/17/2014 11:50 09/17/2014 17:10 09/17/2014 22:14 09/18/2014 08:18  Glucose-Capillary Latest Range: 70-99 mg/dL 292 (H) 315 (H) 352 (H) 282 (H) 258 (H)   Diabetes history: DM2 Outpatient Diabetes medications: Lantus 35 units QHS, Novolog 8-16 units TID with meals, Metformin 1000 mg BID Current orders for Inpatient glycemic control: Lantus 40 units QHS, Novolog 0-20 units AC, Novolog 0-5 units HS, Metformin 1000 mg BID  Inpatient Diabetes Program Recommendations Insulin - Meal Coverage: Please consider increasing meal coverage to Novolog 12 units TID with meals.  Thanks, Barnie Alderman, RN, MSN, CCRN, CDE Diabetes Coordinator Inpatient Diabetes Program [phone] (Team Pager) [phone] (AP office) [phone] [facility] office)

## 2014-09-18 NOTE — Discharge Summary (Signed)
Physician Discharge Summary  [name] GYF:[id number] DOB: 08-06-1961 DOA: 09/15/2014  PCP: Garret Reddish, MD  Admit date: 09/15/2014 Discharge date: 09/18/2014  Time spent: >30 minutes  Recommendations for Outpatient Follow-up:  -Call your PCP ASAP to schedule visit within next 7 days  -Lantus dose increased this admission -Use Ibuprofen OTC dosing for no more than 5 days prn- take with food -Use heating pad as needed for back and ribcage pain  Discharge Diagnoses:    Asthmatic bronchitis with acute exacerbation   Sarcoidosis of lung   Essential hypertension   Type II diabetes mellitus with neurological manifestations, uncontrolled   Chronic diastolic heart failure, NYHA class 2   Hyperlipidemia   Morbid Obesity  Discharge Condition: stable  Diet recommendation: Carbohydrate modified  Filed Weights   09/15/14 1900  Weight: 262 lb (118.842 kg)    History of present illness:  53 y.o. F w/ a history significant for hypertension, hyperlipidemia, type 2 diabetes mellitus, pulmonary sarcoidosis followed at [facility], chronic diastolic heart failure, and COPD who presented to the emergency room with complaint of shortness of breath onset about 3-4 days prior to arrival. A cough was described as a dry cough that progressed to wheezing then significant shortness of breath on the date of admission. She endorsed mild abdominal pain that was worse with coughing. She has a history of chronic cough.   In the emergency room she was quite tachypneic with significant wheezing. An ABG showed 7.4/39/74 on room air. Renal function was normal. She had mild leukocytosis of 10.9, and mild anemia with a hemoglobin of 11.4. She was not hypoxic but had decreased air movement.  Hospital Course:   Sarcoidosis of lung -followed as an OP at [facility] -on chronic Prednisone which had been transitioned to IV Solumedrol for acute wheezing-see below regarding Prednisone taper  Asthmatic bronchitis with  acute exacerbation -stable on RA - ambulatory pulse ox without desaturation -wheezing markedly improved with steroids, nebs and antibiotics and by date of discharge no wheezing or rhonchi noted and paroxsysmal coughing had resolved -Transitioned to Prednisone 12/9 and will taper over 7 days after discharge -cont Levaquin for 4 days after discharge -endorsed pleuritic type pain radiating to back; utilized Toradol as IP so instructed to use OTC Ibuprofen for no more than 5 days after discharge  -Influenza panel negative -resume home anti-tussives, nebs and MDIs  Essential hypertension -BP has remained controlled on usual home meds  Type II diabetes mellitus with neurological manifestations, uncontrolled -CBGS as high as >300 during admit- added meal coverage (normally uses at home) -certainly influenced by acute illness and higher than baseline steroid dose -home Metformin had been held due to acute illness but was resumed 12/9  -Lantus dose increased this admit from 35 to 40 units -HgbA1c was 10.9 in September - repeat this admission 9.2 so Lantus increased  Chronic diastolic heart failure, NYHA class 2 -remained compensated  Hyperlipidemia -cont statin  Morbid Obesity - Body mass index is 42.31 kg/(m^2).  Abdominal pain -seems MS in etiology from coughing  Procedures: none  Consultations: None  Discharge Exam: Filed Vitals:   09/18/14 0833  BP: 159/73  Pulse: 73  Temp: 98.4 F (36.9 C)  Resp: 18   Gen: No acute respiratory distress Chest: Coarse posteriorly with good airway movement, able to complete full sentences w/o pause, RA Cardiac: Regular rate and rhythm, S1-S2, no rubs murmurs or gallops Abdomen: Soft nontender without obvious hepatosplenomegaly, no ascites - obese  Extremities: no  signif c/c/edema B LE    Current Discharge Medication List    START taking these medications   Details  Ibuprofen 200 MG CAPS Take 1-2 capsules (200-400 mg total) by mouth  every 8 (eight) hours as needed (back and ribcage pain). TAKE WITH FOOD!! Qty: 120 each, Refills: 0    levofloxacin (LEVAQUIN) 500 MG tablet Take 1 tablet (500 mg total) by mouth daily. Qty: 4 tablet, Refills: 0      CONTINUE these medications which have CHANGED   Details  buPROPion (WELLBUTRIN XL) 300 MG 24 hr tablet Take 1 tablet (300 mg total) by mouth daily. Qty: 30 tablet, Refills: 10    dexlansoprazole (DEXILANT) 60 MG capsule Take 1 capsule (60 mg total) by mouth daily. Qty: 30 capsule, Refills: 6    diltiazem (CARTIA XT) 120 MG 24 hr capsule TAKE ONE CAPSULE BY MOUTH ONCE DAILY IN THE EVENING Qty: 30 capsule, Refills: 2    Insulin Glargine (LANTUS) 100 UNIT/ML Solostar Pen Inject 40 Units into the skin daily at 10 pm. Qty: 15 mL, Refills: 3    potassium chloride SA (K-DUR,KLOR-CON) 20 MEQ tablet Take 1 tablet (20 mEq total) by mouth 3 (three) times daily. Qty: 90 tablet, Refills: 1    !! predniSONE (DELTASONE) 10 MG tablet Take 4 tabs daily beginning 12/10 then 3 tabs daily for 2 days, then 2 tabs daily for 2 days, then one tab daily for 2 days then resume usual Prednisone 5 mg daily on 12/18 Qty: 16 tablet, Refills: 0    !! predniSONE (DELTASONE) 5 MG tablet Take 1 tablet (5 mg total) by mouth daily with breakfast. Sig 66ms daily    venlafaxine XR (EFFEXOR-XR) 75 MG 24 hr capsule Take 1 capsule (75 mg total) by mouth 2 (two) times daily. Qty: 60 capsule, Refills: 11     !! - Potential duplicate medications found. Please discuss with provider.    CONTINUE these medications which have NOT CHANGED   Details  albuterol (PROVENTIL HFA;VENTOLIN HFA) 108 (90 BASE) MCG/ACT inhaler Inhale 1-2 puffs into the lungs every 6 (six) hours as needed for wheezing or shortness of breath. For wheezing.    albuterol (PROVENTIL) (5 MG/ML) 0.5% nebulizer solution Take 2.5 mg by nebulization every 6 (six) hours as needed. For wheezing.    allopurinol (ZYLOPRIM) 100 MG tablet Take 100 mg by  mouth daily.     aspirin EC 81 MG tablet Take 81 mg by mouth daily.     atorvastatin (LIPITOR) 40 MG tablet Take one tablet by mouth one time daily Qty: 90 tablet, Refills: 3    B-D ULTRAFINE III SHORT PEN 31G X 8 MM MISC Follow directions provided by physician Qty: 30 each, Refills: 2    benzonatate (TESSALON) 200 MG capsule Take 200 mg by mouth as needed for cough.     Blood Glucose Monitoring Suppl (FREESTYLE FREEDOM LITE) W/DEVICE KIT Use as adviced Qty: 1 each, Refills: 0    carvedilol (COREG) 25 MG tablet Take 1 tablet (25 mg total) by mouth 2 (two) times daily with a meal. Qty: 180 tablet, Refills: 3    chlorpheniramine-HYDROcodone (TUSSIONEX) 10-8 MG/5ML LQCR Take 5 mLs by mouth every 12 (twelve) hours as needed for cough. Qty: 480 mL, Refills: 0    cholecalciferol 5000 UNITS TABS Take 1 tablet (5,000 Units total) by mouth daily. Qty: 30 tablet, Refills: 3    estradiol (ESTRACE) 2 MG tablet Take by mouth daily.     fenofibrate (TRICOR) 145  MG tablet Take one tablet by mouth one time daily Qty: 30 tablet, Refills: 10    furosemide (LASIX) 40 MG tablet Take one tablet by mouth twice daily    gabapentin (NEURONTIN) 100 MG capsule Take 1 capsule (100 mg total) by mouth 3 (three) times daily. Qty: 90 capsule, Refills: 3    glucose blood (ONE TOUCH ULTRA TEST) test strip 1 each by Other route 3 (three) times daily. Use as instructed Qty: 100 each, Refills: 5    insulin aspart (NOVOLOG FLEXPEN) 100 UNIT/ML FlexPen Inject 8-16 Units into the skin 3 (three) times daily with meals. Pens please. Qty: 15 mL, Refills: 2    LORazepam (ATIVAN) 0.5 MG tablet Take 1 tablet (0.5 mg total) by mouth 2 (two) times daily as needed for anxiety. Qty: 30 tablet, Refills: 1    losartan (COZAAR) 100 MG tablet Take one tablet by mouth one time daily Qty: 90 tablet, Refills: 1    metFORMIN (GLUCOPHAGE) 1000 MG tablet Take 1 tablet (1,000 mg total) by mouth 2 (two) times daily with a  meal. Qty: 180 tablet, Refills: 3    mometasone-formoterol (DULERA) 200-5 MCG/ACT AERO Inhale 2 puffs into the lungs 2 (two) times daily. Qty: 1 Inhaler, Refills: 6    nitroGLYCERIN (NITROSTAT) 0.4 MG SL tablet Place 0.4 mg under the tongue every 5 (five) minutes as needed. x3 doses as needed for chest pain.    PARoxetine Mesylate (BRISDELLE) 7.5 MG CAPS Take 7.5 mg by mouth daily.    promethazine (PHENERGAN) 12.5 MG tablet Take 1 tablet (12.5 mg total) by mouth every 6 (six) hours as needed for nausea or vomiting. Qty: 30 tablet, Refills: 0      STOP taking these medications     azithromycin (ZITHROMAX) 250 MG tablet        Allergies  Allergen Reactions  . Lisinopril Cough  . Methotrexate     REACTION: peri-oral and buccal lesions.  . Clindamycin/Lincomycin Nausea And Vomiting and Rash   Follow-up Information    Follow up with Garret Reddish, MD.   Specialty:  Family Medicine   Contact information:   [place] [id number] [phone]      Significant Diagnostic Studies: Ct Abdomen Pelvis W Contrast  08/24/2014   CLINICAL DATA:  Abdominal pain and nausea. Prior cholecystectomy and hysterectomy.  EXAM: CT ABDOMEN AND PELVIS WITH CONTRAST  TECHNIQUE: Multidetector CT imaging of the abdomen and pelvis was performed using the standard protocol following bolus administration of intravenous contrast.  CONTRAST:  130m OMNIPAQUE IOHEXOL 300 MG/ML  SOLN  COMPARISON:  CT abdomen pelvis 02/18/2011  FINDINGS: The imaged lung bases are clear. Heart size appears mildly enlarged. Small, stable juxta-diaphragmatic lymph nodes consistent with benign etiology.  Patient has marked and diffuse fatty infiltration of the liver. The liver is enlarged, measuring 24 cm in sagittal length. No focal hepatic mass lesion or intrahepatic biliary ductal dilatation. Portal vein is patent. Status post cholecystectomy. No extrahepatic bile duct dilatation.  The spleen, adrenal glands,  and kidneys are within normal limits. There is fatty atrophy of the pancreas. No pancreatic mass or acute inflammatory change.  There are surgical clips at the gastric fundus. Stomach is unremarkable. Small bowel loops are normal in caliber. No small bowel wall thickening. Normal appearance of the terminal ileum. Normal caliber colon with moderate stool burden. No colonic inflammatory changes. Urinary bladder normal. Hysterectomy. No adnexal mass. Negative for lymphadenopathy, ascites, or free air.  Postoperative changes of anterior  abdominal wall ventral hernia repair with mesh. Tiny umbilical hernia containing fat only noted. No significant hernia.  Normal caliber abdominal aorta with mild areas of focal atherosclerotic calcification. No acute or suspicious osseous abnormality.  IMPRESSION: 1. Hepatomegaly with marked diffuse hepatic steatosis. 2. Moderate colonic stool burden.  Negative for bowel obstruction. 3. Prior cholecystectomy.  Negative for biliary ductal dilatation.   Electronically Signed   By: Curlene Dolphin M.D.   On: 08/24/2014 10:21   Dg Chest Port 1 View  09/15/2014   CLINICAL DATA:  Nonproductive cough since yesterday, wheezing  EXAM: PORTABLE CHEST - 1 VIEW  COMPARISON:  04/19/2014  FINDINGS: The heart size and mediastinal contours are within normal limits. Both lungs are clear. The visualized skeletal structures are unremarkable.  IMPRESSION: No active disease.   Electronically Signed   By: Kathreen Devoid   On: 09/15/2014 15:47    Microbiology: Recent Results (from the past 240 hour(s))  MRSA PCR Screening     Status: None   Collection Time: 09/15/14  6:56 PM  Result Value Ref Range Status   MRSA by PCR NEGATIVE NEGATIVE Final    Comment:        The GeneXpert MRSA Assay (FDA approved for NASAL specimens only), is one component of a comprehensive MRSA colonization surveillance program. It is not intended to diagnose MRSA infection nor to guide or monitor treatment for MRSA  infections.     Labs: Basic Metabolic Panel:  Recent Labs Lab 09/15/14 1520 09/16/14 0245 09/17/14 0645 09/18/14 0340  NA 136* 134* 141 139  K 3.8 4.7 4.6 4.8  CL 95* 93* 97 97  CO2 28 23 25 26   GLUCOSE 197* 390* 320* 277*  BUN 12 14 17 21   CREATININE 0.56 0.55 0.54 0.64  CALCIUM 9.6 9.1 8.8 9.0   CBC:  Recent Labs Lab 09/15/14 1520 09/16/14 0245 09/17/14 0645  WBC 10.9* 9.7 9.5  NEUTROABS 7.1  --   --   HGB 11.4* 10.8* 11.7*  HCT 35.4* 33.3* 36.4  MCV 88.3 87.4 89.7  PLT 306 267 292   BNP (last 3 results)  Recent Labs  12/02/13 2017 03/13/14 2129 09/15/14 1520  PROBNP 63.5 95.1 47.8   CBG:  Recent Labs Lab 09/17/14 1150 09/17/14 1710 09/17/14 2214 09/18/14 0818 09/18/14 1230  GLUCAP 315* 352* 282* 258* 273*   Signed:  ELLIS,ALLISON L. ANP [facility] 09/18/2014, 1:18 PM  I have personally examined this patient and reviewed the entire database. I have reviewed the above note, made any necessary editorial changes, and agree with its content.  Cherene Altes, MD [facility]

## 2014-09-18 NOTE — Progress Notes (Signed)
Pt A&O x4; pt discharge education and instructions completed with pt and spouse at bedside. All voices understanding and denies any questions. Pt IV removed; pt handed her prescription for prednisone and Levaquin. NP Ebony Hail clarifies pt other medication are her home meds and don't need prescription for it except for ibuprofen which is OTC. Pt transported off unit via wheelchair with belonging and spouse at side. Pt discharge home with spouse to transport her home. Francis Gaines Marshun Duva RN.

## 2014-09-18 NOTE — Discharge Instructions (Signed)
Levofloxacin tablets What is this medicine? LEVOFLOXACIN (lee voe FLOX a sin) is a quinolone antibiotic. It is used to treat certain kinds of bacterial infections. It will not work for colds, flu, or other viral infections. This medicine may be used for other purposes; ask your health care provider or pharmacist if you have questions. COMMON BRAND NAME(S): Levaquin, Levaquin Leva-Pak What should I tell my health care provider before I take this medicine? They need to know if you have any of these conditions: -cerebral disease -irregular heartbeat -kidney disease -seizure disorder -an unusual or allergic reaction to levofloxacin, other antibiotics or medicines, foods, dyes, or preservatives -pregnant or trying to get pregnant -breast-feeding How should I use this medicine? Take this medicine by mouth with a full glass of water. Follow the directions on the prescription label. This medicine can be taken with or without food. Take your medicine at regular intervals. Do not take your medicine more often than directed. Do not skip doses or stop your medicine early even if you feel better. Do not stop taking except on your doctor's advice. A special MedGuide will be given to you by the pharmacist with each prescription and refill. Be sure to read this information carefully each time. Talk to your pediatrician regarding the use of this medicine in children. While this drug may be prescribed for children as young as 6 months for selected conditions, precautions do apply. Overdosage: If you think you have taken too much of this medicine contact a poison control center or emergency room at once. NOTE: This medicine is only for you. Do not share this medicine with others. What if I miss a dose? If you miss a dose, take it as soon as you remember. If it is almost time for your next dose, take only that dose. Do not take double or extra doses. What may interact with this medicine? Do not take this medicine  with any of the following medications: -arsenic trioxide -chloroquine -droperidol -medicines for irregular heart rhythm like amiodarone, disopyramide, dofetilide, flecainide, quinidine, procainamide, sotalol -some medicines for depression or mental problems like phenothiazines, pimozide, and ziprasidone This medicine may also interact with the following medications: -amoxapine -antacids -birth control pills -cisapride -dairy products -didanosine (ddI) buffered tablets or powder -haloperidol -multivitamins -NSAIDS, medicines for pain and inflammation, like ibuprofen or naproxen -retinoid products like tretinoin or isotretinoin -risperidone -some other antibiotics like clarithromycin or erythromycin -sucralfate -theophylline -warfarin This list may not describe all possible interactions. Give your health care provider a list of all the medicines, herbs, non-prescription drugs, or dietary supplements you use. Also tell them if you smoke, drink alcohol, or use illegal drugs. Some items may interact with your medicine. What should I watch for while using this medicine? Tell your doctor or health care professional if your symptoms do not improve or if they get worse. Drink several glasses of water a day and cut down on drinks that contain caffeine. You must not get dehydrated while taking this medicine. You may get drowsy or dizzy. Do not drive, use machinery, or do anything that needs mental alertness until you know how this medicine affects you. Do not sit or stand up quickly, especially if you are an older patient. This reduces the risk of dizzy or fainting spells. This medicine can make you more sensitive to the sun. Keep out of the sun. If you cannot avoid being in the sun, wear protective clothing and use a sunscreen. Do not use sun lamps or tanning beds/booths.  Contact your doctor if you get a sunburn. If you are a diabetic monitor your blood glucose carefully. If you get an unusual  reading stop taking this medicine and call your doctor right away. Do not treat diarrhea with over-the-counter products. Contact your doctor if you have diarrhea that lasts more than 2 days or if the diarrhea is severe and watery. Avoid antacids, calcium, iron, and zinc products for 2 hours before and 2 hours after taking a dose of this medicine. What side effects may I notice from receiving this medicine? Side effects that you should report to your doctor or health care professional as soon as possible: -allergic reactions like skin rash or hives, swelling of the face, lips, or tongue -changes in vision -confusion, nightmares or hallucinations -difficulty breathing -irregular heartbeat, chest pain -joint, muscle or tendon pain -pain or difficulty passing urine -persistent headache with or without blurred vision -redness, blistering, peeling or loosening of the skin, including inside the mouth -seizures -unusual pain, numbness, tingling, or weakness -vaginal irritation, discharge Side effects that usually do not require medical attention (report to your doctor or health care professional if they continue or are bothersome): -diarrhea -dry mouth -headache -stomach upset, nausea -trouble sleeping This list may not describe all possible side effects. Call your doctor for medical advice about side effects. You may report side effects to FDA at 1-800-FDA-1088. Where should I keep my medicine? Keep out of the reach of children. Store at room temperature between 15 and 30 degrees C (59 and 86 degrees F). Keep in a tightly closed container. Throw away any unused medicine after the expiration date. NOTE: This sheet is a summary. It may not cover all possible information. If you have questions about this medicine, talk to your doctor, pharmacist, or health care provider.  2015, Elsevier/Gold Standard. (2013-05-04 07:45:07) Acute Bronchitis Bronchitis is inflammation of the airways that extend  from the windpipe into the lungs (bronchi). The inflammation often causes mucus to develop. This leads to a cough, which is the most common symptom of bronchitis.  In acute bronchitis, the condition usually develops suddenly and goes away over time, usually in a couple weeks. Smoking, allergies, and asthma can make bronchitis worse. Repeated episodes of bronchitis may cause further lung problems.  CAUSES Acute bronchitis is most often caused by the same virus that causes a cold. The virus can spread from person to person (contagious) through coughing, sneezing, and touching contaminated objects. SIGNS AND SYMPTOMS   Cough.   Fever.   Coughing up mucus.   Body aches.   Chest congestion.   Chills.   Shortness of breath.   Sore throat.  DIAGNOSIS  Acute bronchitis is usually diagnosed through a physical exam. Your health care provider will also ask you questions about your medical history. Tests, such as chest X-rays, are sometimes done to rule out other conditions.  TREATMENT  Acute bronchitis usually goes away in a couple weeks. Oftentimes, no medical treatment is necessary. Medicines are sometimes given for relief of fever or cough. Antibiotic medicines are usually not needed but may be prescribed in certain situations. In some cases, an inhaler may be recommended to help reduce shortness of breath and control the cough. A cool mist vaporizer may also be used to help thin bronchial secretions and make it easier to clear the chest.  HOME CARE INSTRUCTIONS  Get plenty of rest.   Drink enough fluids to keep your urine clear or pale yellow (unless you have a medical condition  that requires fluid restriction). Increasing fluids may help thin your respiratory secretions (sputum) and reduce chest congestion, and it will prevent dehydration.   Take medicines only as directed by your health care provider.  If you were prescribed an antibiotic medicine, finish it all even if you  start to feel better.  Avoid smoking and secondhand smoke. Exposure to cigarette smoke or irritating chemicals will make bronchitis worse. If you are a smoker, consider using nicotine gum or skin patches to help control withdrawal symptoms. Quitting smoking will help your lungs heal faster.   Reduce the chances of another bout of acute bronchitis by washing your hands frequently, avoiding people with cold symptoms, and trying not to touch your hands to your mouth, nose, or eyes.   Keep all follow-up visits as directed by your health care provider.  SEEK MEDICAL CARE IF: Your symptoms do not improve after 1 week of treatment.  SEEK IMMEDIATE MEDICAL CARE IF:  You develop an increased fever or chills.   You have chest pain.   You have severe shortness of breath.  You have bloody sputum.   You develop dehydration.  You faint or repeatedly feel like you are going to pass out.  You develop repeated vomiting.  You develop a severe headache. MAKE SURE YOU:   Understand these instructions.  Will watch your condition.  Will get help right away if you are not doing well or get worse. Document Released: 11/04/2004 Document Revised: 02/11/2014 Document Reviewed: 03/20/2013 [facility] Patient Information 2015 [place], [place]. This information is not intended to replace advice given to you by your health care provider. Make sure you discuss any questions you have with your health care provider.

## 2014-09-19 ENCOUNTER — Other Ambulatory Visit: Payer: Self-pay | Admitting: Cardiology

## 2014-09-19 ENCOUNTER — Encounter (HOSPITAL_COMMUNITY): Payer: Self-pay | Admitting: Cardiology

## 2014-09-19 ENCOUNTER — Telehealth: Payer: Self-pay | Admitting: Family Medicine

## 2014-09-19 NOTE — Telephone Encounter (Signed)
Rx was sent to pharmacy electronically. 

## 2014-09-19 NOTE — Telephone Encounter (Signed)
Pls advise.  

## 2014-09-19 NOTE — Telephone Encounter (Signed)
Pt needs hosp fup. Pt had acute bronchitis, copd flair up. Is it ok to work her in or does she need to schedule w/ someone else?  (Within 7-10 days)

## 2014-09-19 NOTE — Telephone Encounter (Signed)
Lm w/ date and time of appt. Advised opt to cb and confirm.

## 2014-09-19 NOTE — Telephone Encounter (Signed)
Work her in

## 2014-09-20 NOTE — Telephone Encounter (Signed)
confirmed

## 2014-09-30 ENCOUNTER — Ambulatory Visit (INDEPENDENT_AMBULATORY_CARE_PROVIDER_SITE_OTHER): Payer: BC Managed Care – PPO | Admitting: Family Medicine

## 2014-09-30 ENCOUNTER — Encounter: Payer: Self-pay | Admitting: Family Medicine

## 2014-09-30 VITALS — BP 110/62 | HR 84 | Temp 98.1°F | Wt 260.0 lb

## 2014-09-30 DIAGNOSIS — K760 Fatty (change of) liver, not elsewhere classified: Secondary | ICD-10-CM

## 2014-09-30 DIAGNOSIS — E1141 Type 2 diabetes mellitus with diabetic mononeuropathy: Secondary | ICD-10-CM | POA: Diagnosis not present

## 2014-09-30 DIAGNOSIS — J449 Chronic obstructive pulmonary disease, unspecified: Secondary | ICD-10-CM

## 2014-09-30 DIAGNOSIS — IMO0002 Reserved for concepts with insufficient information to code with codable children: Secondary | ICD-10-CM

## 2014-09-30 DIAGNOSIS — E1165 Type 2 diabetes mellitus with hyperglycemia: Secondary | ICD-10-CM

## 2014-09-30 DIAGNOSIS — E1149 Type 2 diabetes mellitus with other diabetic neurological complication: Secondary | ICD-10-CM

## 2014-09-30 NOTE — Assessment & Plan Note (Signed)
Hospitalization due to Asthmatic bronchitis with recent exacerbation in patient with pulmonary sarcoidosis- now improved. I will increase her daily steroids back to 8mg  and have her follow up with [facility] in January. May be candidate for reduction to 7mg  in future. Continue maintenance inhalers and albuterol prn (currently 2-3 x a day and hopeful will improve on increased prednisone). Lungs were clear today-strict return precautions given.

## 2014-09-30 NOTE — Assessment & Plan Note (Signed)
Lantus increased to 40 units in hospital. Patient's fasting CBGs down to 130-140. Continue current lantus with metformin 1g BID, aspart sliding scale and follow up as planned with endocrine.

## 2014-09-30 NOTE — Patient Instructions (Signed)
I am glad you are doing better.   Considering your recent hospitalization and since going down to 7mg  prednisone, your increased use or prednisone= I want you to increase to 8mg  prednisone until you see [facility] again on January 12th.   If your cough or hoarseness were to worsen, please do not hesitate to get in here to see [place].

## 2014-09-30 NOTE — Assessment & Plan Note (Signed)
Incidentally found during hospitalization. LFTs are in normal range. Follow them q6-12 months. Would love to have weight loss but chronic steroids and lung function limit ability.

## 2014-09-30 NOTE — Progress Notes (Signed)
Garret Reddish, MD Phone: [phone]  Subjective:   [name] is a 53 y.o. year old very pleasant female patient who presents with the following:  Hospital follow up-Recent asthmatic bronchitis with acute exacerbation leading to hospitalization; pulmonary Sarcoidosis  Hospitalized from 09/15/14 to 09/18/14: presented with shortness of breath for 3-4 days associated with wheeze as well as abdominal pain and bak pain worse with coughing. She had tachypnea and some signs of respiratory distress but no hypoxia. Flu was negative. For her sarcoidosis she was treatd with IV solumedrol and later transitioned to prednisone taper (on chronic 47m of prednisone). For bronchitis, this improved with steroids, nebs, levaquin x appears 10 days. Treated with course of toradol/ibuprofen for MSK abdomen and chest wall pain from coughing (now resolved). Treated with 10 day increase in steroids from baseline chronic 757mdaily prednisone when she went home.   Currently patient is having 2-3 coughing spells a day and taking albuterol. This overall is much improved than previous. hse also complains of some hoarseness with prolonged talking. Patient admits when she was on 42m41mrednisone she was taking albuterol 0-1 x per day and before flare up of bronchitis, she was taking 1-2x per day down to the 7mg742m prednisone.   Full medication reconciliation performed and shows same meds as during discharge.   During hospitalization had Lantus increased to 40 units and CBGs at home have ben 130-140 in AM. a1c was 9.2 down from 10.9 in September. She follows with endocrinology for this  Review of lab studies shows she had a CT abdomen which showed "hepatomegaly with marked diffuse hepatic steatosis"  ROS-no chest pain or shortness of breath. No fever/chills.   Past Medical History- Patient Active Problem List   Diagnosis Date Noted  . Obstructive chronic bronchitis without exacerbation 09/18/2013    Priority: High  .  Chest pain - at rest, and with exertion; -- angiographically normal coronary arteries, mildly elevated LVEDP 04/11/2013    Priority: High  . Chronic diastolic heart failure, NYHA class 2     Priority: High  . Sarcoidosis of lung 04/10/2007    Priority: High  . Type II diabetes mellitus with neurological manifestations, uncontrolled 08/21/2006    Priority: High  . Fatty liver 09/30/2014    Priority: Medium  . Depression     Priority: Medium  . Gout 08/20/2010    Priority: Medium  . Anemia 09/18/2009    Priority: Medium  . Hyperlipidemia 08/21/2006    Priority: Medium  . Essential hypertension 08/21/2006    Priority: Medium  . Hot flashes 07/15/2014    Priority: Low  . Abnormal SPEP 04/17/2014    Priority: Low  . Fracture of left leg 04/17/2014    Priority: Low  . Cushingoid side effect of steroids 04/17/2014    Priority: Low  . Internal hemorrhoids     Priority: Low  . Preoperative clearance 03/25/2014    Priority: Low  . Other and unspecified coagulation defects 04/22/2013    Priority: Low  . Solitary pulmonary nodule, on CT 02/2013 - stable over 2 years in 2015 02/20/2013    Priority: Low  . Chronic cough 12/30/2011    Priority: Low  . Obesity 06/04/2009    Priority: Low  . GERD 08/21/2006    Priority: Low   Medications- reviewed and updated Current Outpatient Prescriptions  Medication Sig Dispense Refill  . allopurinol (ZYLOPRIM) 100 MG tablet Take 100 mg by mouth daily.     . asMarland Kitchenirin EC 81  MG tablet Take 81 mg by mouth daily.     [name] atorvastatin (LIPITOR) 40 MG tablet Take one tablet by mouth one time daily 90 tablet 3  . B-D ULTRAFINE III SHORT PEN 31G X 8 MM MISC Follow directions provided by physician 30 each 2  . Blood Glucose Monitoring Suppl (FREESTYLE FREEDOM LITE) W/DEVICE KIT Use as adviced 1 each 0  . buPROPion (WELLBUTRIN XL) 300 MG 24 hr tablet Take 1 tablet (300 mg total) by mouth daily. 30 tablet 10  . carvedilol (COREG) 25 MG tablet Take 1 tablet  (25 mg total) by mouth 2 (two) times daily with a meal. 180 tablet 3  . cholecalciferol 5000 UNITS TABS Take 1 tablet (5,000 Units total) by mouth daily. 30 tablet 3  . dexlansoprazole (DEXILANT) 60 MG capsule Take 1 capsule (60 mg total) by mouth daily. 30 capsule 6  . diltiazem (CARTIA XT) 120 MG 24 hr capsule TAKE ONE CAPSULE BY MOUTH ONCE DAILY IN THE EVENING 30 capsule 2  . estradiol (ESTRACE) 2 MG tablet Take by mouth daily.     . fenofibrate (TRICOR) 145 MG tablet Take one tablet by mouth one time daily 30 tablet 10  . furosemide (LASIX) 40 MG tablet Take one tablet by mouth twice daily    . gabapentin (NEURONTIN) 100 MG capsule Take 1 capsule (100 mg total) by mouth 3 (three) times daily. 90 capsule 3  . glucose blood (ONE TOUCH ULTRA TEST) test strip 1 each by Other route 3 (three) times daily. Use as instructed 100 each 5  . insulin aspart (NOVOLOG FLEXPEN) 100 UNIT/ML FlexPen Inject 8-16 Units into the skin 3 (three) times daily with meals. Pens please. 15 mL 2  . Insulin Glargine (LANTUS) 100 UNIT/ML Solostar Pen Inject 40 Units into the skin daily at 10 pm. 15 mL 3  . LORazepam (ATIVAN) 0.5 MG tablet Take 1 tablet (0.5 mg total) by mouth 2 (two) times daily as needed for anxiety. 30 tablet 1  . losartan (COZAAR) 100 MG tablet TAKE ONE TABLET BY MOUTH ONE TIME DAILY  90 tablet 1  . metFORMIN (GLUCOPHAGE) 1000 MG tablet Take 1 tablet (1,000 mg total) by mouth 2 (two) times daily with a meal. 180 tablet 3  . mometasone-formoterol (DULERA) 200-5 MCG/ACT AERO Inhale 2 puffs into the lungs 2 (two) times daily. 1 Inhaler 6  . PARoxetine Mesylate (BRISDELLE) 7.5 MG CAPS Take 7.5 mg by mouth daily.    . potassium chloride SA (K-DUR,KLOR-CON) 20 MEQ tablet Take 1 tablet (20 mEq total) by mouth 3 (three) times daily. 90 tablet 1  . predniSONE (DELTASONE) 5 MG tablet also takes 30m pills to total 728mTake 1 tablet (5 mg total) by mouth daily with breakfast. Sig 74m61mdaily    . venlafaxine XR  (EFFEXOR-XR) 75 MG 24 hr capsule Take 1 capsule (75 mg total) by mouth 2 (two) times daily. 60 capsule 11  . albuterol (PROVENTIL HFA;VENTOLIN HFA) 108 (90 BASE) MCG/ACT inhaler Inhale 1-2 puffs into the lungs every 6 (six) hours as needed for wheezing or shortness of breath. For wheezing.    . aMarland Kitchenbuterol (PROVENTIL) (5 MG/ML) 0.5% nebulizer solution Take 2.5 mg by nebulization every 6 (six) hours as needed. For wheezing.    . benzonatate (TESSALON) 200 MG capsule Take 200 mg by mouth as needed for cough.     . chlorpheniramine-HYDROcodone (TUSSIONEX) 10-8 MG/5ML LQCR Take 5 mLs by mouth every 12 (twelve) hours as needed for cough. (Patient  not taking: Reported on 09/30/2014) 480 mL 0  . nitroGLYCERIN (NITROSTAT) 0.4 MG SL tablet Place 0.4 mg under the tongue every 5 (five) minutes as needed. x3 doses as needed for chest pain.    . promethazine (PHENERGAN) 12.5 MG tablet Take 1 tablet (12.5 mg total) by mouth every 6 (six) hours as needed for nausea or vomiting. (Patient not taking: Reported on 09/30/2014) 30 tablet 0     Objective: BP 110/62 mmHg  Pulse 84  Temp(Src) 98.1 F (36.7 C)  Wt 260 lb (117.935 kg)  SpO2 97% Gen: NAD, resting comfortably in chair, obese, cushingoid MMM, TM normal, mild erythema turbinates CV: RRR no murmurs rubs or gallops Lungs: CTAB no crackles, wheeze, rhonchi (no longer coarse as at time of discharge and without wheeze) Abdomen: soft/nontender/nondistended/normal bowel sounds. No rebound or guarding.  Ext: 2+ chronic edema Neuro: grossly normal, moves all extremities   Assessment/Plan:  Hospital follow up-improving.   Obstructive chronic bronchitis without exacerbation Hospitalization due to Asthmatic bronchitis with recent exacerbation in patient with pulmonary sarcoidosis- now improved. I will increase her daily steroids back to 46m and [facility]. May be candidate for reduction to 769min future. Continue maintenance inhalers  and albuterol prn (currently 2-3 x a day and hopeful will improve on increased prednisone). Lungs were clear today-strict return precautions given.   Patient's breathing difficulties and albuterol usage increased on 39m45mnd hopeful this will return to normal back on 8mg43m Type II diabetes mellitus with neurological manifestations, uncontrolled Lantus increased to 40 units in hospital. Patient's fasting CBGs down to 130-140. Continue current lantus with metformin 1g BID, aspart sliding scale and follow up as planned with endocrine.   Fatty liver Incidentally found during hospitalization. LFTs are in normal range. Follow them q6-12 months. Would love to have weight loss but chronic steroids and lung function limit ability.   Strict Return precautions advised.

## 2014-10-03 ENCOUNTER — Other Ambulatory Visit: Payer: Self-pay | Admitting: Cardiology

## 2014-10-03 NOTE — Telephone Encounter (Signed)
Rx(s) sent to pharmacy electronically.  

## 2014-10-07 ENCOUNTER — Other Ambulatory Visit: Payer: Self-pay | Admitting: Internal Medicine

## 2014-10-09 IMAGING — CR DG CHEST 1V PORT
1 series · 1 of 1 positions shown · non-contrast
Comparison: 10/02/2013

CLINICAL DATA: Shortness of breath, cough.

EXAM:
PORTABLE CHEST - 1 VIEW

[AP]
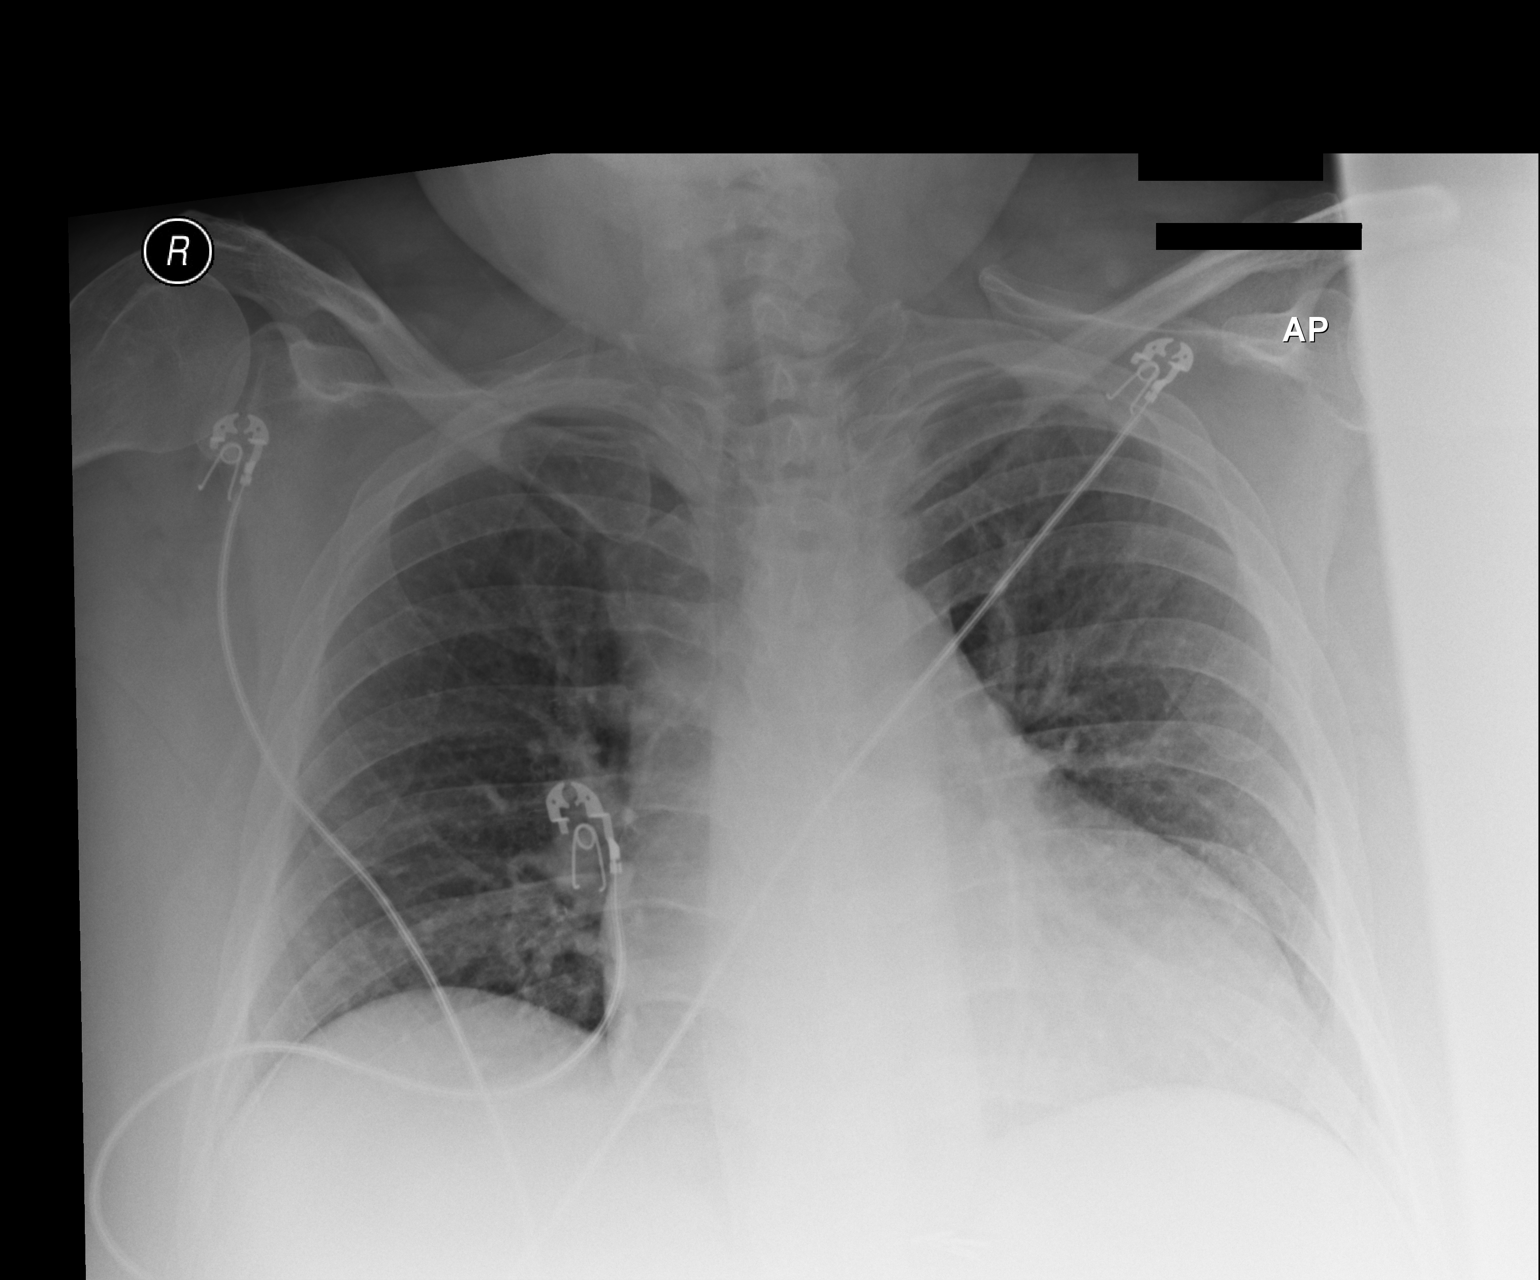

[1 of 1 positions shown; findings below may reference images not displayed]

FINDINGS: Cardiomegaly. Lungs are clear. No effusions or edema. No acute bony
abnormality.
IMPRESSION: Cardiomegaly.  No active disease.

## 2014-10-12 IMAGING — CR DG CHEST 2V
2 series · 2 of 2 positions shown · non-contrast
Comparison: Chest radiograph performed 11/29/2013

CLINICAL DATA: Shortness of breath, wheezing and chest pain.

EXAM:
CHEST  2 VIEW

[w chest lat]
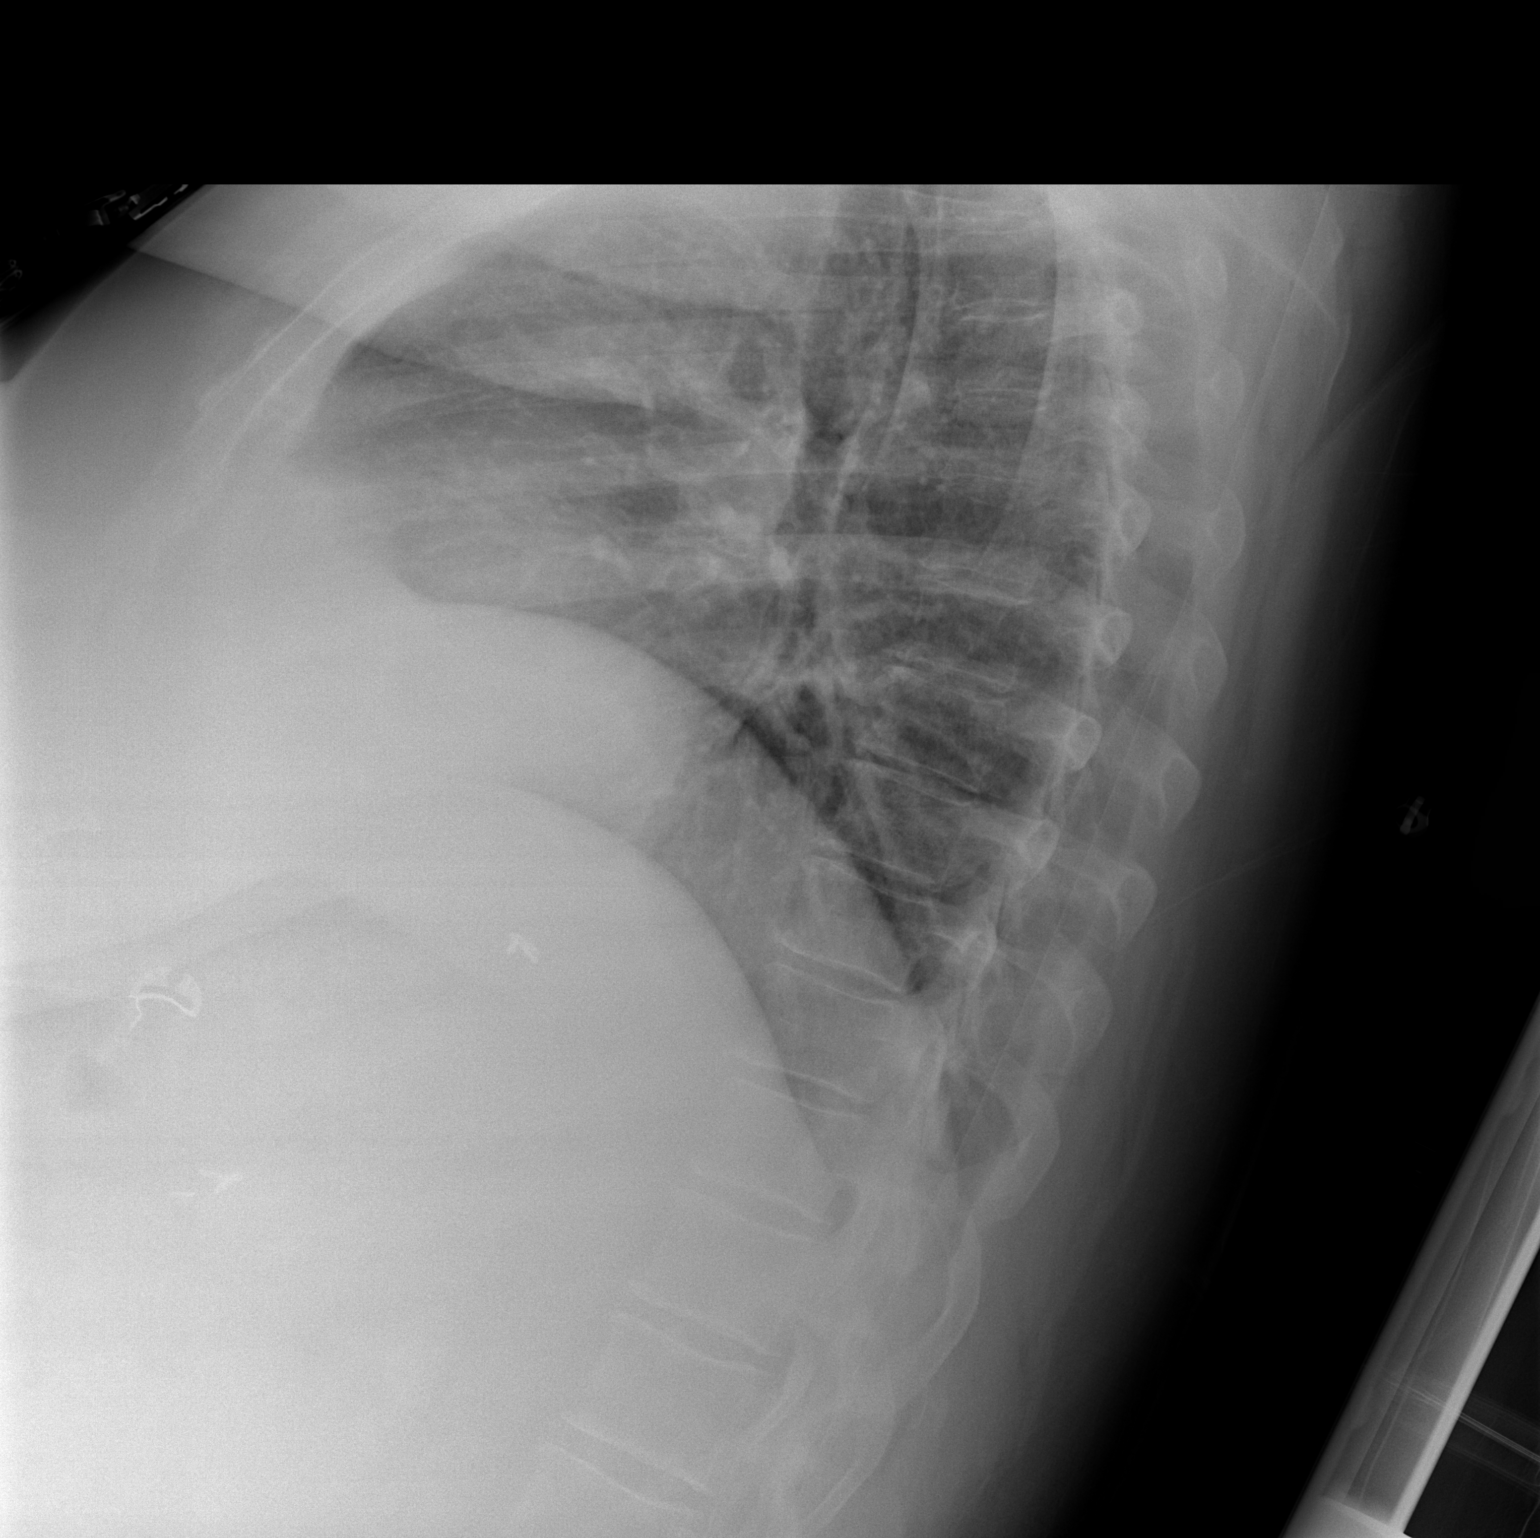

[x chest ap]
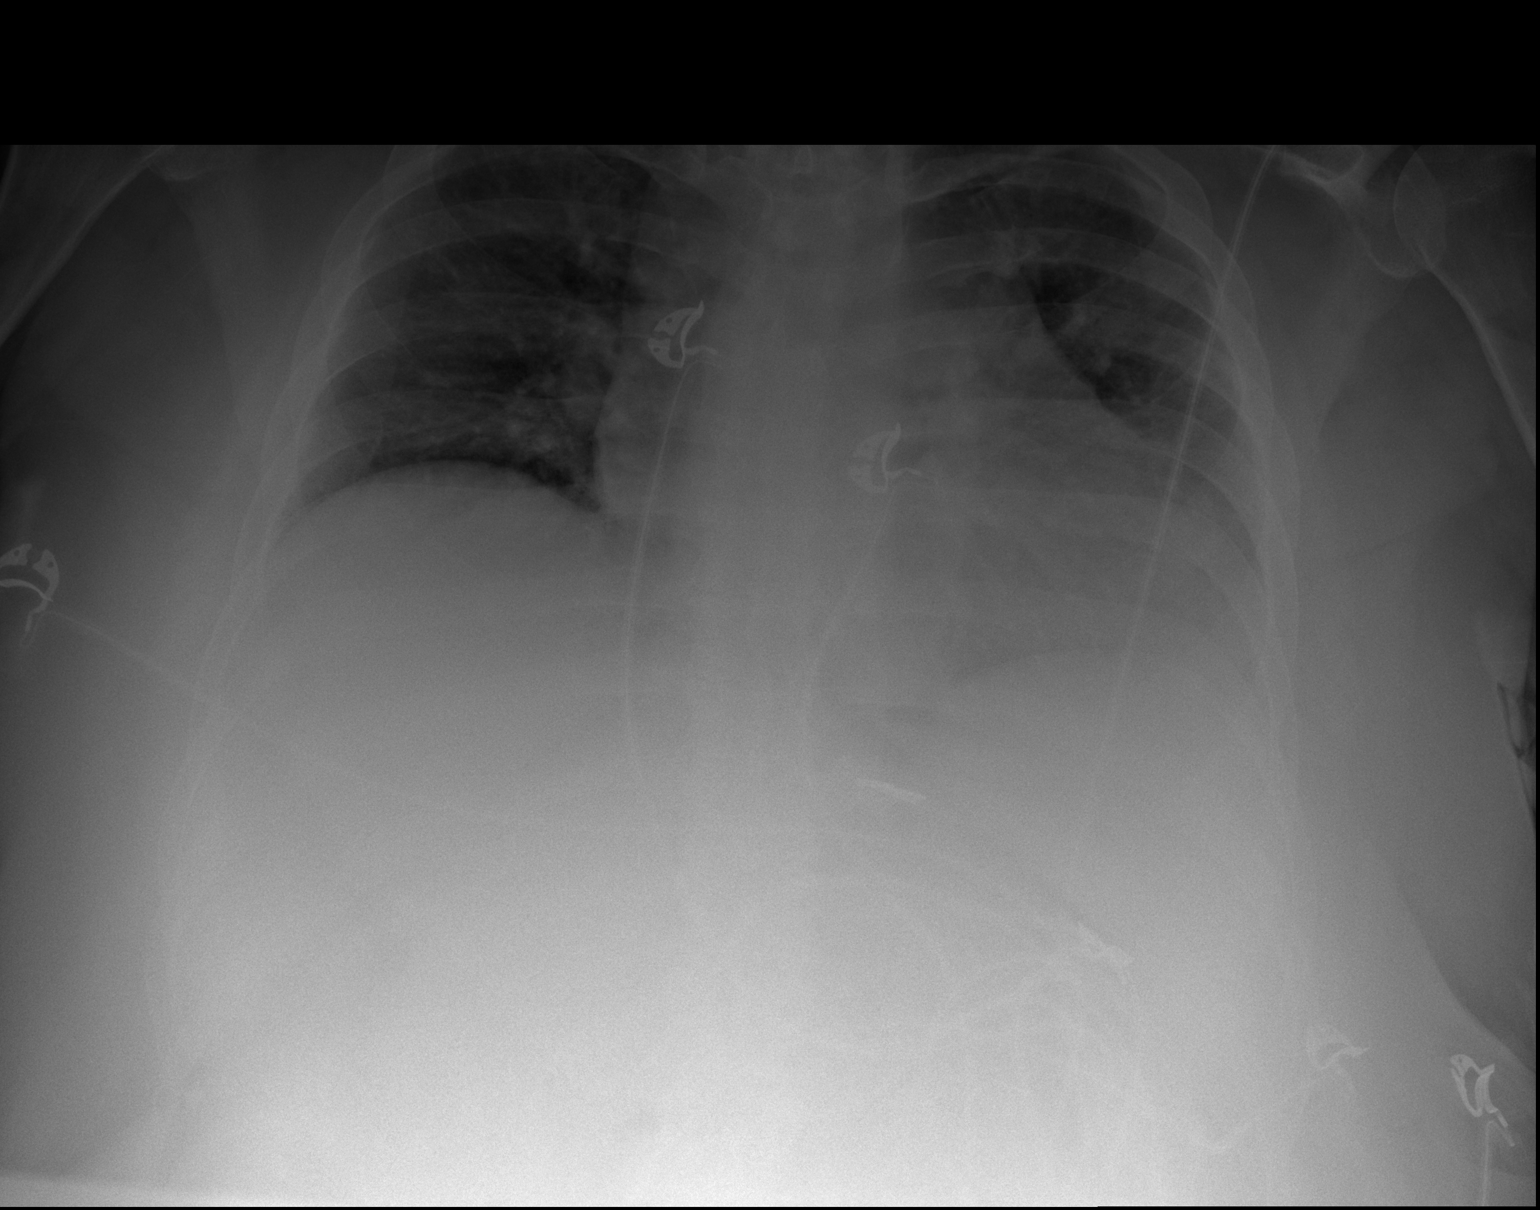

[2 of 2 positions shown; findings below may reference images not displayed]

FINDINGS: The lungs are hypoexpanded. Vascular crowding is noted. Mild left
basilar opacity likely reflects atelectasis. No definite pleural
effusion or pneumothorax is seen.

The heart is borderline enlarged. No acute osseous abnormalities are
seen. Postoperative change is noted overlying the gastroesophageal
junction.
IMPRESSION: Lungs hypoexpanded; mild left basilar airspace opacity likely
reflects atelectasis. Borderline cardiomegaly noted.

## 2014-10-14 ENCOUNTER — Ambulatory Visit: Payer: BC Managed Care – PPO | Admitting: Internal Medicine

## 2014-10-15 ENCOUNTER — Ambulatory Visit: Payer: BC Managed Care – PPO | Admitting: Family Medicine

## 2014-10-18 ENCOUNTER — Ambulatory Visit: Payer: BC Managed Care – PPO | Admitting: Internal Medicine

## 2014-10-22 DIAGNOSIS — Z6841 Body Mass Index (BMI) 40.0 and over, adult: Secondary | ICD-10-CM | POA: Diagnosis not present

## 2014-10-22 DIAGNOSIS — D869 Sarcoidosis, unspecified: Secondary | ICD-10-CM | POA: Diagnosis not present

## 2014-10-22 DIAGNOSIS — R0602 Shortness of breath: Secondary | ICD-10-CM | POA: Diagnosis not present

## 2014-10-22 DIAGNOSIS — D860 Sarcoidosis of lung: Secondary | ICD-10-CM | POA: Diagnosis not present

## 2014-10-24 ENCOUNTER — Ambulatory Visit (INDEPENDENT_AMBULATORY_CARE_PROVIDER_SITE_OTHER): Payer: BLUE CROSS/BLUE SHIELD | Admitting: Internal Medicine

## 2014-10-24 ENCOUNTER — Ambulatory Visit: Payer: BLUE CROSS/BLUE SHIELD | Admitting: Internal Medicine

## 2014-10-24 ENCOUNTER — Encounter: Payer: Self-pay | Admitting: Internal Medicine

## 2014-10-24 VITALS — BP 118/64 | HR 93 | Temp 98.4°F | Resp 12 | Wt 267.2 lb

## 2014-10-24 DIAGNOSIS — IMO0002 Reserved for concepts with insufficient information to code with codable children: Secondary | ICD-10-CM

## 2014-10-24 DIAGNOSIS — E1141 Type 2 diabetes mellitus with diabetic mononeuropathy: Secondary | ICD-10-CM | POA: Diagnosis not present

## 2014-10-24 DIAGNOSIS — E1165 Type 2 diabetes mellitus with hyperglycemia: Secondary | ICD-10-CM

## 2014-10-24 DIAGNOSIS — E1149 Type 2 diabetes mellitus with other diabetic neurological complication: Secondary | ICD-10-CM

## 2014-10-24 MED ORDER — INSULIN GLARGINE 300 UNIT/ML ~~LOC~~ SOPN: 40.0000 [IU] | PEN_INJECTOR | Freq: Every day | SUBCUTANEOUS | Status: DC

## 2014-10-24 NOTE — Progress Notes (Signed)
Patient ID: [name], female   DOB: 1961-02-08, 54 y.o.   MRN: [id number]  HPI: [name] is a 54 y.o.-year-old female, returning for f/u DM2, dx 2007, insulin-dependent, uncontrolled, with complications (CHF stage 2, peripheral neuropathy). Last visit 2 mo ago.  She was in the hospital for 09/15/2014 for COPD excerbation.  Last hemoglobin A1c was: Lab Results  Component Value Date   HGBA1C 9.2* 09/16/2014   HGBA1C 10.9* 07/01/2014   HGBA1C 8.7* 12/10/2013  She is on Prednisone 10 >> 40 >> 7 mg for sarcoidosis.   [name] on a regimen of: - Metformin 1000 mg po bid >> tolerates it well. She is on a B-complex. - NovoLog - please inject 15 min before a meal - 8 units with a small meal (b'fast) - 12 units with a regular meal  (lunch) - 16 units with a large meal (dinner) - Lantus 40 units qhs - pen She stopped Invokana 300 mg daily - added 01/2014 - had 2 yeast infections (stopped 06/2014) We stopped Glipizide XL 10 mg. We stopped Glyburide 2.5 mg  Pt checks her sugars 1 a day and they are - higher when on Prednisone: - am: 115-258 (higher range when on Prednisone) >> 200-210 >> 135-225, 266 >> 130-169, 180 - 2h after b'fast: n/c >> 200, 315 >> n/c - before lunch: n/c >> n/c >> n/c >> 190s >> n/c >> 130-180 - 2h after lunch: n/c >> n/c >> 414 >> n/c - before dinner: n/c >> 230 >> n/c >> 190s >> 268 >> 140-160 - 2h after dinner: n/c >> 245 >> 229, 247 >> n/c - bedtime: 200s >> 241-462 (mostly 200s) >> 199-395 >> 190s >> 240-342 >> 150-329 - nighttime: n/c No lows. Lowest sugar was 130; she has hypoglycemia awareness at 70s.  Highest sugar was 500s >> 300s.  Meter: Freestyle Freedom Lite. (last night)   She had a nutrition class >> helped.   Pt's meals are: - Breakfast: egg + bacon or if go out: bisquit - Lunch: sandwich - Dinner: meat + veggies + starch - Snacks: pork skins and cookies - 1-2 a day; also after dinner No exercise.  - no CKD, last BUN/creatinine:   Lab Results  Component Value Date   BUN 21 09/18/2014   CREATININE 0.64 09/18/2014  On Losartan. - last set of lipids: Lab Results  Component Value Date   CHOL 211* 12/10/2013   HDL 44.80 12/10/2013   LDLCALC UNABLE TO CALCULATE IF TRIGLYCERIDE OVER 400 mg/dL 05/28/2011   LDLDIRECT 117.9 12/10/2013   TRIG 508.0* 12/10/2013   CHOLHDL 5 12/10/2013  On Lipitor. - last eye exam was in 05/20/2014 - sees eye dr once a year. No DR.  - + numbness and tingling in her feet and legs hurt. She has restless legs. We started Neurontin 100 mg hs >> works well.  She also has gout, HL, HTN, sarcoidosis of lung, GERD, COPD.  I reviewed pt's medications, allergies, PMH, social hx, family hx, and changes were documented in the history of present illness. Otherwise, unchanged from my initial visit note.  ROS: Constitutional: no weight gain/loss, no fatigue, no subjective hyperthermia/hypothermia Eyes: no blurry vision, no xerophthalmia ENT: no sore throat, no nodules palpated in throat, no dysphagia/odynophagia, no hoarseness Cardiovascular: no CP/SOB/palpitations/+ periankle swelling Respiratory: no cough/no SOB Gastrointestinal: no N/V/D/C/heartburn Musculoskeletal:+ proximal LE pain/joint aches Skin: no rashes Neurological: no tremors/tingling/dizziness  PE: BP 118/64 mmHg  Pulse 93  Temp(Src) 98.4 F (36.9 C) (  Oral)  Resp 12  Wt 267 lb 3.2 oz (121.201 kg)  SpO2 95% Wt Readings from Last 3 Encounters:  10/24/14 267 lb 3.2 oz (121.201 kg)  09/30/14 260 lb (117.935 kg)  09/15/14 262 lb (118.842 kg)   Constitutional: obese, in NAD Eyes: PERRLA, EOMI, no exophthalmos ENT: moist mucous membranes, no thyromegaly, no cervical lymphadenopathy Cardiovascular: tachycardia, RR, No MRG Respiratory: CTA B Gastrointestinal: abdomen soft, NT, ND, BS+ Musculoskeletal: no deformities, strength intact in all 4 Skin: moist, warm, no rashes Neurological: no tremor with outstretched  hands  ASSESSMENT: 1. DM2, insulin-dependent, uncontrolled, with complications - CHF stage 2 - peripheral neuropathy  2. Diabetic peripheral neuropathy - on neurontin 100 >> works well  PLAN:  1. Patient with long-standing, recently more uncontrolled diabetes, on oral antidiabetic regimen + Basal insulin +  mealtime insulin. Sugars still very high. - I suggested to:  Patient Instructions  Please continue: - Metformin 1000 mg 2x a day Continue Lantus 35 units at bedtime Increase NovoLog 15 min before every meal: - 8 units with a small meal - 12 units with a regular meal - 16 units with a large meal  Please return in 1.5 month with your sugar log.  - advised her that she may need to double the dose of NovoLog if on Prednsione again  - continue checking sugars at different times of the day - check 3 times a day, rotating checks! - she is up to date with yearly eye exams - already had flu and PNA vaccine this season - Return to clinic in 2 mo with sugar log   2. Diabetic PN - continue Neurontin 100 mg hs  - no SE

## 2014-10-24 NOTE — Patient Instructions (Addendum)
Please continue Metformin 1000 mg 2x a day Stop Lantus and start Toujeo 40 units at bedtime Increase NovoLog 15 min before every meal: - 10 units with a small meal - 14 units with a regular meal - 18 units with a large meal  - 5 units with a snack (after dinner)  Please return in 2 months with your sugar log.   Please consider the following ways to cut down carbs and fat and increase fiber and micronutrients in your diet:  - substitute whole grain for white bread or pasta - substitute brown rice for white rice - substitute 90-calorie flat bread pieces for slices of bread when possible - substitute sweet potatoes or yams for white potatoes - substitute humus for margarine - substitute tofu for cheese when possible - substitute almond or rice milk for regular milk (would not drink soy milk daily due to concern for soy estrogen influence on breast cancer risk) - substitute dark chocolate for other sweets when possible - substitute water - can add lemon or orange slices for taste - for diet sodas (artificial sweeteners will trick your body that you can eat sweets without getting calories and will lead you to overeating and weight gain in the long run) - do not skip breakfast or other meals (this will slow down the metabolism and will result in more weight gain over time)  - can try smoothies made from fruit and almond/rice milk in am instead of regular breakfast - can also try old-fashioned (not instant) oatmeal made with almond/rice milk in am - order the dressing on the side when eating salad at a restaurant (pour less than half of the dressing on the salad) - eat as little meat as possible - can try juicing, but should not forget that juicing will get rid of the fiber, so would alternate with eating raw veg./fruits or drinking smoothies - use as little oil as possible, even when using olive oil - can dress a salad with a mix of balsamic vinegar and lemon juice, for e.g. - use agave nectar,  stevia sugar, or regular sugar rather than artificial sweateners - steam or broil/roast veggies  - snack on veggies/fruit/nuts (unsalted, preferably) when possible, rather than processed foods - reduce or eliminate aspartame in diet (it is in diet sodas, chewing gum, etc) Read the labels!  Try to read Dr. Janene Harvey book: "Program for Reversing Diabetes" for the vegan concept and other ideas for healthy eating.  Try also to look up the following diets: - plant based (vegan) - plan Z  - hormone reset diet

## 2014-11-05 ENCOUNTER — Other Ambulatory Visit: Payer: Self-pay | Admitting: Cardiology

## 2014-11-05 NOTE — Telephone Encounter (Signed)
Rx(s) sent to pharmacy electronically.  

## 2014-11-07 DIAGNOSIS — M170 Bilateral primary osteoarthritis of knee: Secondary | ICD-10-CM | POA: Diagnosis not present

## 2014-11-07 DIAGNOSIS — S92355D Nondisplaced fracture of fifth metatarsal bone, left foot, subsequent encounter for fracture with routine healing: Secondary | ICD-10-CM | POA: Diagnosis not present

## 2014-11-16 ENCOUNTER — Other Ambulatory Visit: Payer: Self-pay | Admitting: Family Medicine

## 2014-12-02 ENCOUNTER — Encounter: Payer: Self-pay | Admitting: Family Medicine

## 2014-12-02 ENCOUNTER — Ambulatory Visit (INDEPENDENT_AMBULATORY_CARE_PROVIDER_SITE_OTHER): Payer: BLUE CROSS/BLUE SHIELD | Admitting: Family Medicine

## 2014-12-02 VITALS — BP 122/56 | Temp 98.1°F | Wt 261.0 lb

## 2014-12-02 DIAGNOSIS — N3000 Acute cystitis without hematuria: Secondary | ICD-10-CM | POA: Diagnosis not present

## 2014-12-02 DIAGNOSIS — R3915 Urgency of urination: Secondary | ICD-10-CM

## 2014-12-02 DIAGNOSIS — J0100 Acute maxillary sinusitis, unspecified: Secondary | ICD-10-CM

## 2014-12-02 LAB — POCT URINALYSIS DIPSTICK
Bilirubin, UA: NEGATIVE
Blood, UA: NEGATIVE
Glucose, UA: NEGATIVE
Ketones, UA: NEGATIVE
Nitrite, UA: NEGATIVE
Protein, UA: NEGATIVE
Spec Grav, UA: 1.01
Urobilinogen, UA: 0.2
pH, UA: 5

## 2014-12-02 MED ORDER — CEPHALEXIN 500 MG PO CAPS: 500.0000 mg | ORAL_CAPSULE | Freq: Four times a day (QID) | ORAL | Status: DC

## 2014-12-02 NOTE — Patient Instructions (Addendum)
UTI  Send for culture to make sure right antibiotic  Keflex for 7 days 4x a day  Return for new or worsening or symptoms that do not resolve  Hope this helps with sinus symptoms as well  3-4 months follow up

## 2014-12-02 NOTE — Progress Notes (Signed)
Garret Reddish, MD Phone: [phone]  Subjective:   [name] is a 54 y.o. year old very pleasant female patient who presents with the following:  Presumed UTI -x3 days, steady since it started. frequent urination. Painful urination. Left low back pain. Some lower abdominal pain. Last UTI over a year ago. Urgency as well. No treatments tried. Nothing makes it better or worse.  ROS- no fevers. No nausea.   Sinusitis Also has had sinus congestion and pressure for 10 days. Steady in course. Mainly maxillary. Has not had any improvement. Some OTC pain relievers have helped some.   Past Medical History- Patient Active Problem List   Diagnosis Date Noted  . Obstructive chronic bronchitis without exacerbation 09/18/2013    Priority: High  . Chest pain - at rest, and with exertion; -- angiographically normal coronary arteries, mildly elevated LVEDP 04/11/2013    Priority: High  . Chronic diastolic heart failure, NYHA class 2     Priority: High  . Sarcoidosis of lung 04/10/2007    Priority: High  . Type II diabetes mellitus with neurological manifestations, uncontrolled 08/21/2006    Priority: High  . Fatty liver 09/30/2014    Priority: Medium  . Depression     Priority: Medium  . Gout 08/20/2010    Priority: Medium  . Anemia 09/18/2009    Priority: Medium  . Hyperlipidemia 08/21/2006    Priority: Medium  . Essential hypertension 08/21/2006    Priority: Medium  . Hot flashes 07/15/2014    Priority: Low  . Abnormal SPEP 04/17/2014    Priority: Low  . Fracture of left leg 04/17/2014    Priority: Low  . Cushingoid side effect of steroids 04/17/2014    Priority: Low  . Internal hemorrhoids     Priority: Low  . Preoperative clearance 03/25/2014    Priority: Low  . Other and unspecified coagulation defects 04/22/2013    Priority: Low  . Solitary pulmonary nodule, on CT 02/2013 - stable over 2 years in 2015 02/20/2013    Priority: Low  . Chronic cough 12/30/2011   Priority: Low  . Obesity 06/04/2009    Priority: Low  . GERD 08/21/2006    Priority: Low   Medications- reviewed and updated Current Outpatient Prescriptions  Medication Sig Dispense Refill  . allopurinol (ZYLOPRIM) 100 MG tablet Take 100 mg by mouth daily.     [name] aspirin EC 81 MG tablet Take 81 mg by mouth daily.     [name] atorvastatin (LIPITOR) 40 MG tablet Take one tablet by mouth one time daily 90 tablet 3  . B-D ULTRAFINE III SHORT PEN 31G X 8 MM MISC Follow directions provided by physician 30 each 2  . benzonatate (TESSALON) 200 MG capsule Take 200 mg by mouth as needed for cough.     . Blood Glucose Monitoring Suppl (FREESTYLE FREEDOM LITE) W/DEVICE KIT Use as adviced 1 each 0  . buPROPion (WELLBUTRIN XL) 300 MG 24 hr tablet Take 1 tablet (300 mg total) by mouth daily. 30 tablet 10  . Calcium Citrate 200 MG TABS Take by mouth.    . CARTIA XT 120 MG 24 hr capsule TAKE ONE CAPSULE BY MOUTH ONE TIME DAILY IN THE EVENING  30 capsule 11  . carvedilol (COREG) 25 MG tablet Take 1 tablet (25 mg total) by mouth 2 (two) times daily with a meal. 180 tablet 3  . cholecalciferol 5000 UNITS TABS Take 1 tablet (5,000 Units total) by mouth daily. 30 tablet 3  .  dexlansoprazole (DEXILANT) 60 MG capsule Take 1 capsule (60 mg total) by mouth daily. 30 capsule 6  . diltiazem (CARTIA XT) 120 MG 24 hr capsule TAKE ONE CAPSULE BY MOUTH ONCE DAILY IN THE EVENING 30 capsule 2  . estradiol (ESTRACE) 2 MG tablet Take by mouth daily.     . fenofibrate (TRICOR) 145 MG tablet TAKE ONE TABLET BY MOUTH ONE TIME DAILY  30 tablet 10  . furosemide (LASIX) 40 MG tablet Take one tablet by mouth twice daily    . glucose blood (ONE TOUCH ULTRA TEST) test strip 1 each by Other route 3 (three) times daily. Use as instructed 100 each 5  . insulin aspart (NOVOLOG FLEXPEN) 100 UNIT/ML FlexPen Inject 8-16 Units into the skin 3 (three) times daily with meals. Pens please. 15 mL 2  . insulin aspart (NOVOLOG) 100 UNIT/ML injection  Inject 10-18 Units into the skin 3 (three) times daily before meals.    . Insulin Glargine (TOUJEO SOLOSTAR) 300 UNIT/ML SOPN Inject 40 Units into the skin at bedtime. 3 pen 2  . losartan (COZAAR) 100 MG tablet TAKE ONE TABLET BY MOUTH ONE TIME DAILY  90 tablet 1  . metFORMIN (GLUCOPHAGE) 1000 MG tablet Take 1 tablet (1,000 mg total) by mouth 2 (two) times daily with a meal. 180 tablet 3  . mometasone-formoterol (DULERA) 200-5 MCG/ACT AERO Inhale 2 puffs into the lungs 2 (two) times daily. 1 Inhaler 6  . PARoxetine Mesylate (BRISDELLE) 7.5 MG CAPS Take 7.5 mg by mouth daily.    . potassium chloride SA (K-DUR,KLOR-CON) 20 MEQ tablet Take 1 tablet (20 mEq total) by mouth 3 (three) times daily. 90 tablet 1  . predniSONE (DELTASONE) 1 MG tablet Take 1 mg by mouth.    . predniSONE (DELTASONE) 5 MG tablet Take 1 tablet (5 mg total) by mouth daily with breakfast. Sig 54ms daily    . venlafaxine XR (EFFEXOR-XR) 75 MG 24 hr capsule Take 1 capsule (75 mg total) by mouth 2 (two) times daily. 60 capsule 11  . albuterol (PROVENTIL HFA;VENTOLIN HFA) 108 (90 BASE) MCG/ACT inhaler Inhale 1-2 puffs into the lungs every 6 (six) hours as needed for wheezing or shortness of breath. For wheezing.    .Marland Kitchenalbuterol (PROVENTIL) (5 MG/ML) 0.5% nebulizer solution Take 2.5 mg by nebulization every 6 (six) hours as needed. For wheezing.    . gabapentin (NEURONTIN) 100 MG capsule Take 1 capsule (100 mg total) by mouth 3 (three) times daily. 90 capsule 3  . LORazepam (ATIVAN) 0.5 MG tablet Take 1 tablet (0.5 mg total) by mouth 2 (two) times daily as needed for anxiety. (Patient not taking: Reported on 12/02/2014) 30 tablet 1  . nitroGLYCERIN (NITROSTAT) 0.4 MG SL tablet Place 0.4 mg under the tongue every 5 (five) minutes as needed. x3 doses as needed for chest pain.    . promethazine (PHENERGAN) 12.5 MG tablet Take 1 tablet (12.5 mg total) by mouth every 6 (six) hours as needed for nausea or vomiting. (Patient not taking: Reported  on 12/02/2014) 30 tablet 0   Objective: BP 122/56 mmHg  Temp(Src) 98.1 F (36.7 C)  Wt 261 lb (118.389 kg) Gen: NAD, resting comfortably, obese, cushingoid features CV: RRR no murmurs rubs or gallops Lungs: CTAB no crackles, wheeze, rhonchi Abdomen: soft/nontender/nondistended/normal bowel sounds.  Ext: 1+ pitting edema Skin: warm, dry, no rash   Results for orders placed or performed in visit on 12/02/14 (from the past 24 hour(s))  POC Urinalysis Dipstick  Status: None   Collection Time: 12/02/14  2:06 PM  Result Value Ref Range   Color, UA yellow    Clarity, UA clear    Glucose, UA n    Bilirubin, UA n    Ketones, UA n    Spec Grav, UA 1.010    Blood, UA n    pH, UA 5.0    Protein, UA n    Urobilinogen, UA 0.2    Nitrite, UA n    Leukocytes, UA moderate (2+)    *Note: Due to a large number of results and/or encounters for the requested time period, some results have not been displayed. A complete set of results can be found in Results Review.   Assessment/Plan:  UTI  UA with leukocytes. Send for culture. Patient high risk as immunosuppressed on chronic prednisone. We will treat with keflex QID x 7 days. GFR seems to be able to handle this.   Sinusitis May be viral but 10 days duration, did not want to given augmentin in addition to keflex so we are hoping course of keflex will give some relief though I admit obviously subpar for coverage in this area.   Return precautions advised-primarily if worsening or no resolution.   Meds ordered this encounter  Medications  . cephALEXin (KEFLEX) 500 MG capsule    Sig: Take 1 capsule (500 mg total) by mouth 4 (four) times daily.    Dispense:  28 capsule    Refill:  0

## 2014-12-04 LAB — URINE CULTURE: Colony Count: 90000

## 2014-12-04 IMAGING — CT CT CHEST W/O CM
2 of 3 series · 15 of 36 positions shown, 18 images · non-contrast
Comparison: 02/11/2013 and 12/30/2011

CLINICAL DATA: Followup pulmonary nodule

EXAM:
CT CHEST WITHOUT CONTRAST
TECHNIQUE: Multidetector CT imaging of the chest was performed following the
standard protocol without IV contrast.

[Series 2: chest routine with · axial · 0.71mm/px · z∈[-254,-40]mm · 12 of 51 slices shown, 15 images]
[im 4/51  mediastinal]
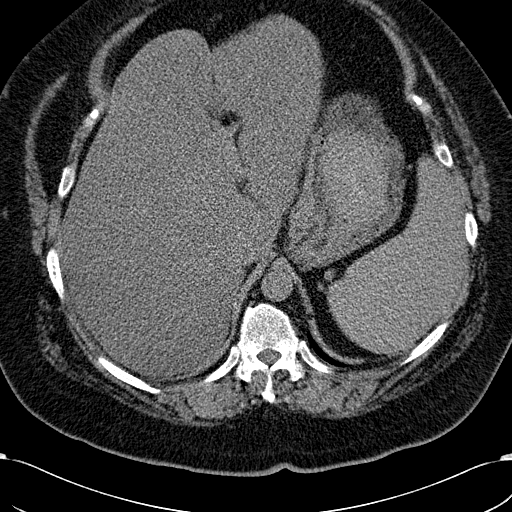
[im 4/51  lung]
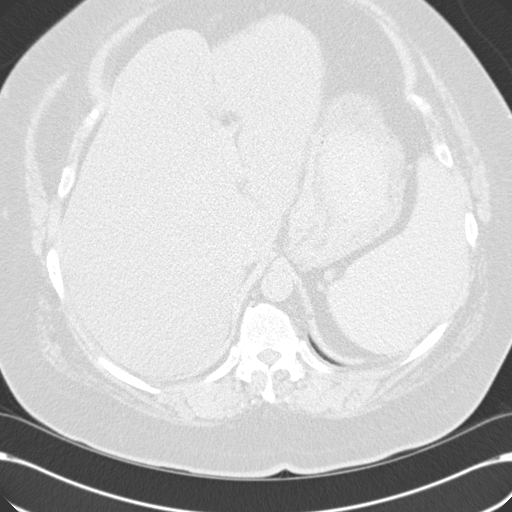
[im 8/51  lung]
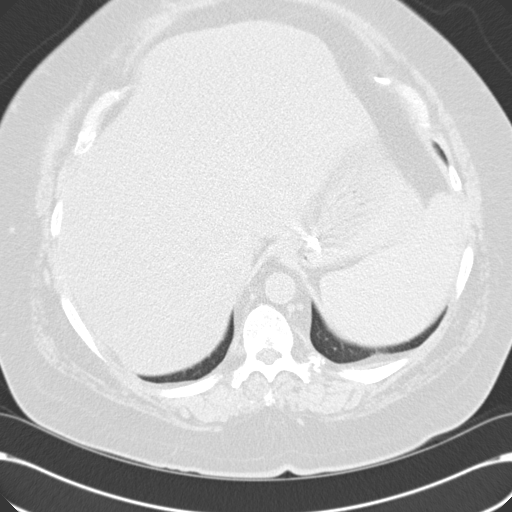
[im 12/51  lung]
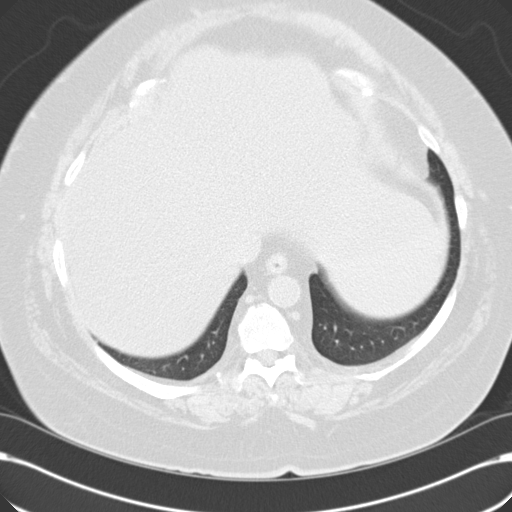
[im 15/51  lung]
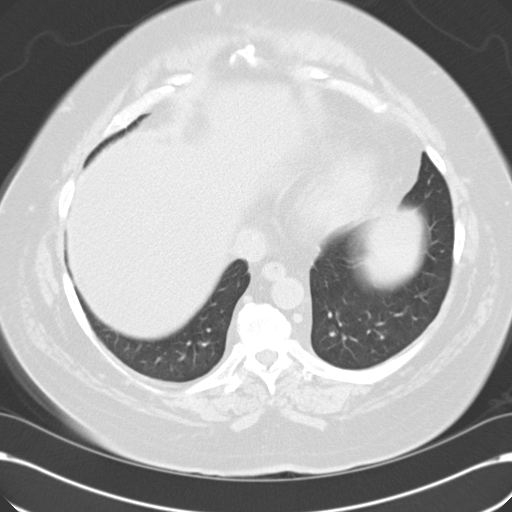
[im 19/51  mediastinal]
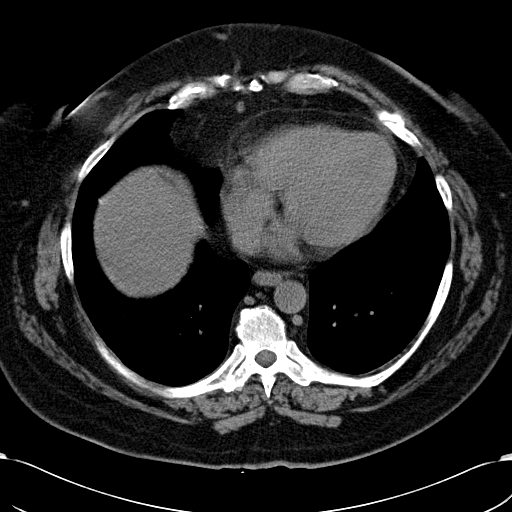
[im 19/51  lung]
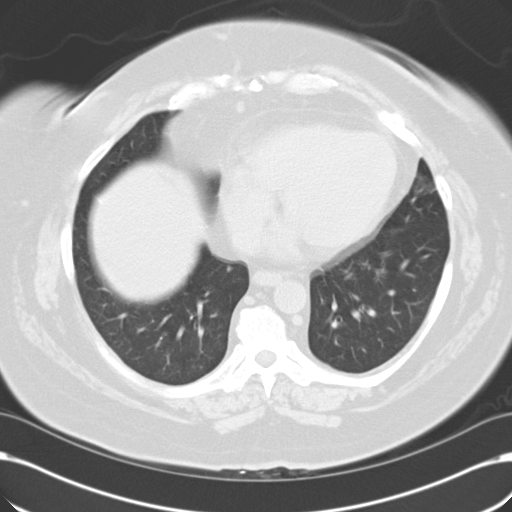
[im 23/51  lung]
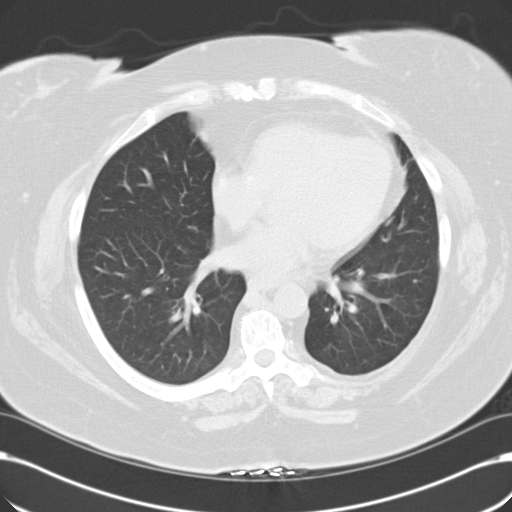
[im 28/51  lung]
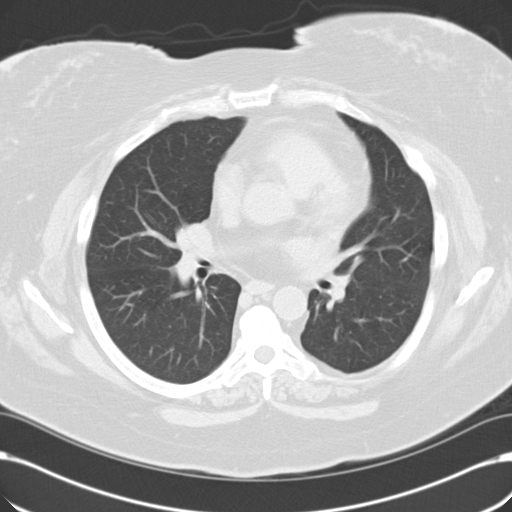
[im 32/51  lung]
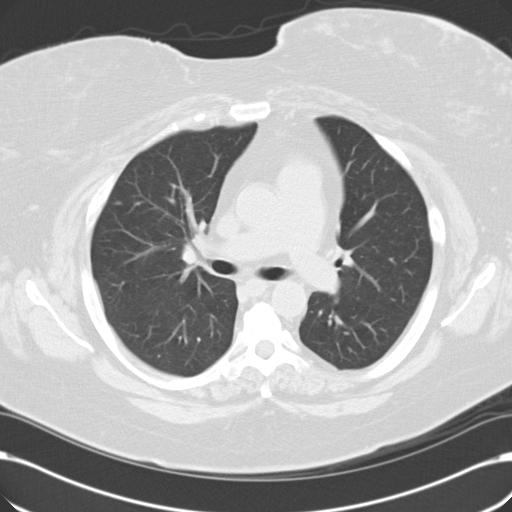
[im 36/51  mediastinal]
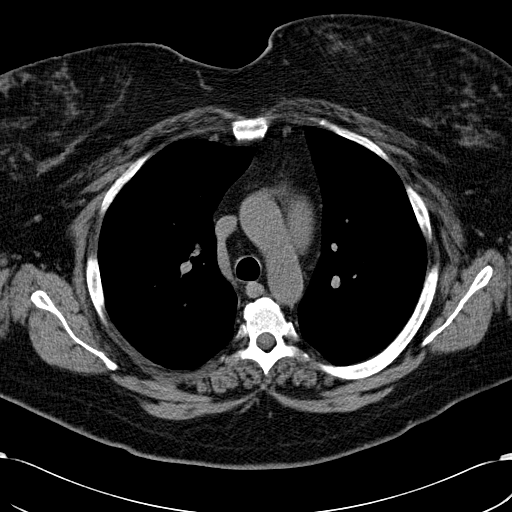
[im 36/51  lung]
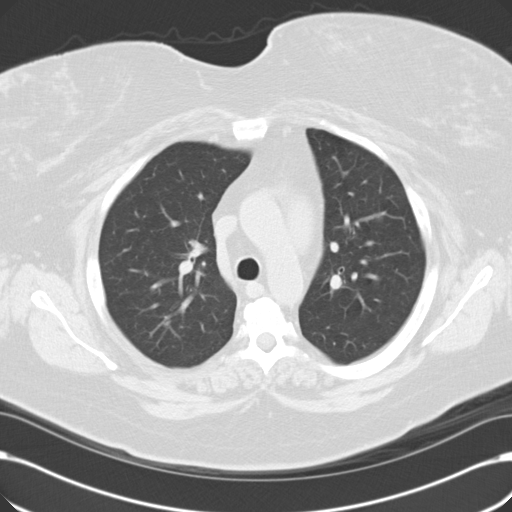
[im 39/51  lung]
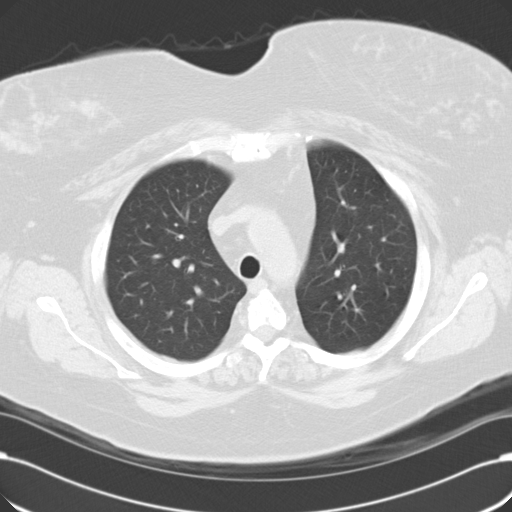
[im 43/51  lung]
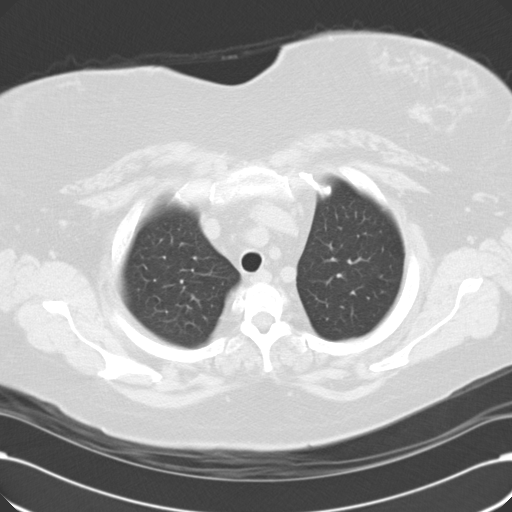
[im 47/51  lung]
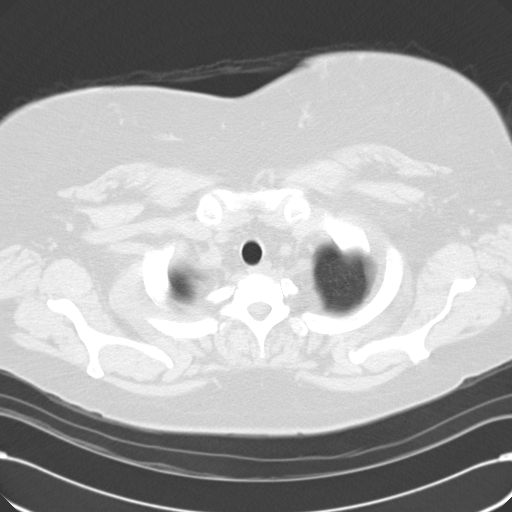

[Series 602: cor · coronal · 0.71mm/px · 3 of 120 slices shown]
[im 24/120  lung]
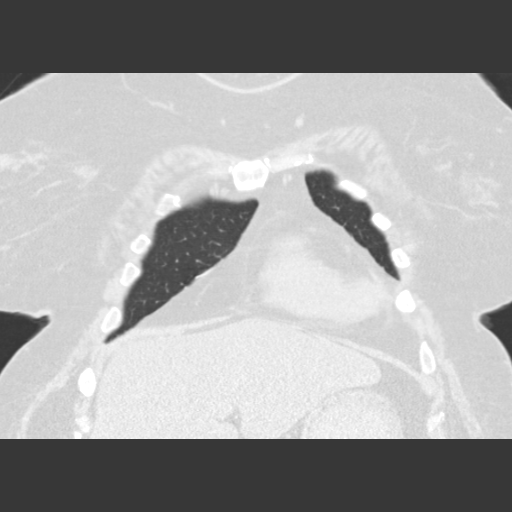
[im 48/120  lung]
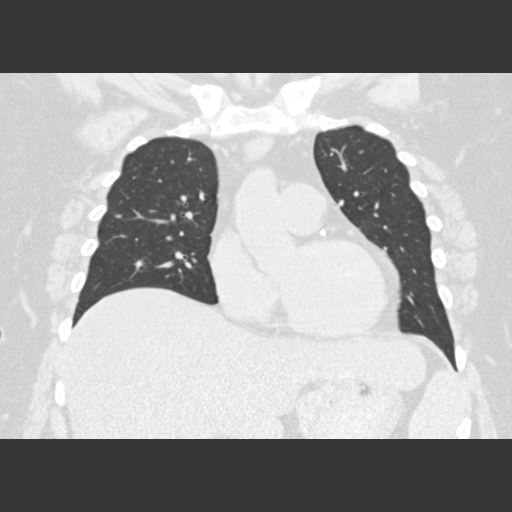
[im 72/120  lung]
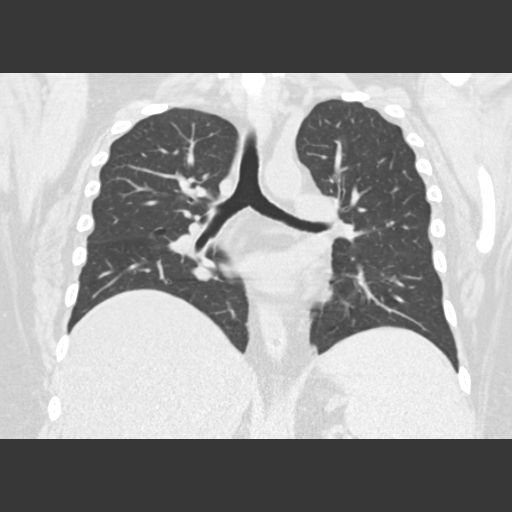

[15 of 36 positions shown; findings below may reference images not displayed]

FINDINGS: Stable 5 mm right upper lobe nodule, image 20/series 3. This is
unchanged from 12/30/2011. No new or enlarging pulmonary nodules or
masses.

The heart size appears normal. No pleural or pericardial effusion
identified. No enlarged mediastinal or hilar lymph nodes. There is
no pathologically enlarged axillary or supraclavicular lymph nodes.

Incidental imaging through the upper abdomen shows no acute
findings.

Review of the visualized osseous structures is significant for mild
spondylosis of the thoracic spine. Chronic left anterior rib
fractures are noted, image 25 and image 35/series 2.
IMPRESSION: 1. Stable right upper lobe pulmonary nodule. This is been unchanged
since 12/30/2011 and is likely benign.

## 2014-12-18 ENCOUNTER — Encounter: Payer: Self-pay | Admitting: Family Medicine

## 2014-12-18 ENCOUNTER — Ambulatory Visit (INDEPENDENT_AMBULATORY_CARE_PROVIDER_SITE_OTHER): Payer: BLUE CROSS/BLUE SHIELD | Admitting: Family Medicine

## 2014-12-18 VITALS — BP 124/76 | HR 86 | Temp 98.1°F | Ht 65.5 in | Wt 275.9 lb

## 2014-12-18 DIAGNOSIS — R109 Unspecified abdominal pain: Secondary | ICD-10-CM

## 2014-12-18 NOTE — Progress Notes (Signed)
HPI:  [name] is a 54 yo F patient of doctor Yong Channel here for an acute visit for:  ? Hernia: -noticed a "pooch" in Lower abd last week that she does not think she had before that is not painful, but she notices it with some discomfort last week - now resolved -denies: fevers, malaise, nausea, vomiting, diarrhea, dysuria, change in bowel, melena, hematochezia -hx multiple abd surgeries and mesh in upper abd for hernia    ROS: See pertinent positives and negatives per HPI.  Past Medical History  Diagnosis Date  . DIABETES MELLITUS, TYPE II 08/21/2006  . HYPERLIPIDEMIA 08/21/2006  . GOUT 08/20/2010  . OBESITY 06/04/2009  . ANEMIA-UNSPECIFIED 09/18/2009  . HYPERTENSION 08/21/2006  . GERD 08/21/2006  . SLEEP APNEA 04/21/2009    last testing was negative  . Internal hemorrhoids   . Pulmonary sarcoidosis     Followed locally by pulmonology, but also by Dr. Casper Harrison at [facility]  . Exertional chest pain     sharp, substernal, exertional  . Vocal cord dysfunction   . Chronic diastolic heart failure, NYHA class 2     LVEDP roughly 20% by cath  . CHF (congestive heart failure)   . COPD (chronic obstructive pulmonary disease)   . Depression   . Fracture of 5th metatarsal     non union  . Abnormal SPEP 04/17/2014    Past Surgical History  Procedure Laterality Date  . Ventral hernia repair    . Nissen fundoplication  3496  . Cholecystectomy  1984  . Abdominal hysterectomy    . Knee arthroscopy      right  . Tubal ligation      with reversal in 1994  . Bladder suspension  11/11/2011    Procedure: TRANSVAGINAL TAPE (TVT) PROCEDURE;  Surgeon: Olga Millers, MD;  Location: [facility];  Service: Gynecology;  Laterality: N/A;  . Cystoscopy  11/11/2011    Procedure: CYSTOSCOPY;  Surgeon: Olga Millers, MD;  Location: [facility];  Service: Gynecology;  Laterality: N/A;  . Doppler echocardiography  02/12/2013    LV FUNCTION, SIZE NORMAL; MILD CONCENTRIC LVH; EST EF 55-65%;  WALL MOTION NORMAL  . Lexiscan myoview  03/09/2013    EF 50%; NORMAL MYOCARDIAL PERFUSION STUDY - breast attenuation  . Cardiac catheterization  07/2010    LVEF 50-55% WITH VERY MILD GLOBAL HYPOKINESIA; ESSENTIALLY NORMAL CORONARY ARTERIES; NORMAL LV FUNCTION  . Carotids  02/18/11    CAROTID DUPLEX; VERTEBRALS ARE PATENT WITH ANTEGRADE FLOW. ICA/CCA RATIO 1.61 ON RIGHT AND 0.75 ON LEFT  . Right and left cardiac catheterization  04/23/2013    Angiographic normal coronaries; LVEDP 20 mmHg, PCWP 12-14 mmHg, RAP 12 mmHg.; Fick CO/CI 4.9/2.2  . Metatarsal osteotomy with open reduction internal fixation (orif) metatarsal with fusion Left 04/09/2014    Procedure: LEFT FOOT FRACTURE OPEN TREATMENT METATARSAL INCLUDES INTERNAL FIXATION EACH;  Surgeon: Lorn Junes, MD;  Location: [facility];  Service: Orthopedics;  Laterality: Left;  . Left and right heart catheterization with coronary angiogram N/A 04/23/2013    Procedure: LEFT AND RIGHT HEART CATHETERIZATION WITH CORONARY ANGIOGRAM;  Surgeon: Leonie Man, MD;  Location: [facility];  Service: Cardiovascular;  Laterality: N/A;    Family History  Problem Relation Age of Onset  . Diabetes Father   . Heart attack Father   . Coronary artery disease Father   . Heart failure Father   . COPD Mother   . Emphysema Mother   .  Asthma Mother   . Heart failure Mother   . Sarcoidosis Maternal Uncle   . Colon cancer Neg Hx   . Lung cancer Brother   . Cancer Brother   . Diabetes Brother   . Heart attack Maternal Grandfather     History   Social History  . Marital Status: Married    Spouse Name: MARKIYAH GAHM  . Number of Children: 2  . Years of Education: 12   Occupational History  . DISABLED    Social History Main Topics  . Smoking status: Never Smoker   . Smokeless tobacco: Never Used  . Alcohol Use: No  . Drug Use: No  . Sexual Activity: Yes    Birth Control/ Protection: Surgical   Other Topics Concern  . None    Social History Narrative   Married 1994. 2 sons who both live close and 1 grandson.       Disability due to sarcoidosis. 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  albuterol (PROVENTIL HFA;VENTOLIN HFA) 108 (90 BASE) MCG/ACT inhaler, Inhale 1-2 puffs into the lungs every 6 (six) hours as needed for wheezing or shortness of breath. For wheezing., Disp: , Rfl:  .  albuterol (PROVENTIL) (5 MG/ML) 0.5% nebulizer solution, Take 2.5 mg by nebulization every 6 (six) hours as needed. For wheezing., Disp: , Rfl:  .  allopurinol (ZYLOPRIM) 100 MG tablet, Take 100 mg by mouth daily. , Disp: , Rfl:  .  aspirin EC 81 MG tablet, Take 81 mg by mouth daily. , Disp: , Rfl:  .  atorvastatin (LIPITOR) 40 MG tablet, Take one tablet by mouth one time daily, Disp: 90 tablet, Rfl: 3 .  B-D ULTRAFINE III SHORT PEN 31G X 8 MM MISC, Follow directions provided by physician, Disp: 30 each, Rfl: 2 .  benzonatate (TESSALON) 200 MG capsule, Take 200 mg by mouth as needed for cough. , Disp: , Rfl:  .  Blood Glucose Monitoring Suppl (FREESTYLE FREEDOM LITE) W/DEVICE KIT, Use as adviced, Disp: 1 each, Rfl: 0 .  buPROPion (WELLBUTRIN XL) 300 MG 24 hr tablet, Take 1 tablet (300 mg total) by mouth daily., Disp: 30 tablet, Rfl: 10 .  Calcium Citrate 200 MG TABS, Take by mouth., Disp: , Rfl:  .  CARTIA XT 120 MG 24 hr capsule, TAKE ONE CAPSULE BY MOUTH ONE TIME DAILY IN THE EVENING , Disp: 30 capsule, Rfl: 11 .  carvedilol (COREG) 25 MG tablet, Take 1 tablet (25 mg total) by mouth 2 (two) times daily with a meal., Disp: 180 tablet, Rfl: 3 .  cholecalciferol 5000 UNITS TABS, Take 1 tablet (5,000 Units total) by mouth daily., Disp: 30 tablet, Rfl: 3 .  dexlansoprazole (DEXILANT) 60 MG capsule, Take 1 capsule (60 mg total) by mouth daily., Disp: 30  capsule, Rfl: 6 .  estradiol (ESTRACE) 2 MG tablet, Take by mouth daily. , Disp: , Rfl:  .  fenofibrate (TRICOR) 145 MG tablet, TAKE ONE TABLET BY MOUTH ONE TIME DAILY , Disp: 30 tablet, Rfl: 10 .  furosemide (LASIX) 40 MG tablet, Take one tablet by mouth twice daily, Disp: , Rfl:  .  gabapentin (NEURONTIN) 100 MG capsule, Take 1 capsule (100 mg total) by mouth 3 (three) times daily., Disp: 90 capsule, Rfl: 3 .  glucose blood (ONE  TOUCH ULTRA TEST) test strip, 1 each by Other route 3 (three) times daily. Use as instructed, Disp: 100 each, Rfl: 5 .  insulin aspart (NOVOLOG FLEXPEN) 100 UNIT/ML FlexPen, Inject 8-16 Units into the skin 3 (three) times daily with meals. Pens please., Disp: 15 mL, Rfl: 2 .  insulin aspart (NOVOLOG) 100 UNIT/ML injection, Inject 10-18 Units into the skin 3 (three) times daily before meals., Disp: , Rfl:  .  Insulin Glargine (TOUJEO SOLOSTAR) 300 UNIT/ML SOPN, Inject 40 Units into the skin at bedtime., Disp: 3 pen, Rfl: 2 .  LORazepam (ATIVAN) 0.5 MG tablet, Take 1 tablet (0.5 mg total) by mouth 2 (two) times daily as needed for anxiety., Disp: 30 tablet, Rfl: 1 .  losartan (COZAAR) 100 MG tablet, TAKE ONE TABLET BY MOUTH ONE TIME DAILY , Disp: 90 tablet, Rfl: 1 .  metFORMIN (GLUCOPHAGE) 1000 MG tablet, Take 1 tablet (1,000 mg total) by mouth 2 (two) times daily with a meal., Disp: 180 tablet, Rfl: 3 .  mometasone-formoterol (DULERA) 200-5 MCG/ACT AERO, Inhale 2 puffs into the lungs 2 (two) times daily., Disp: 1 Inhaler, Rfl: 6 .  nitroGLYCERIN (NITROSTAT) 0.4 MG SL tablet, Place 0.4 mg under the tongue every 5 (five) minutes as needed. x3 doses as needed for chest pain., Disp: , Rfl:  .  PARoxetine Mesylate (BRISDELLE) 7.5 MG CAPS, Take 7.5 mg by mouth daily., Disp: , Rfl:  .  potassium chloride SA (K-DUR,KLOR-CON) 20 MEQ tablet, Take 1 tablet (20 mEq total) by mouth 3 (three) times daily., Disp: 90 tablet, Rfl: 1 .  predniSONE (DELTASONE) 1 MG tablet, Take 1 mg by mouth.,  Disp: , Rfl:  .  predniSONE (DELTASONE) 5 MG tablet, Take 1 tablet (5 mg total) by mouth daily with breakfast. Sig 15ms daily, Disp: , Rfl:  .  promethazine (PHENERGAN) 12.5 MG tablet, Take 1 tablet (12.5 mg total) by mouth every 6 (six) hours as needed for nausea or vomiting., Disp: 30 tablet, Rfl: 0 .  venlafaxine XR (EFFEXOR-XR) 75 MG 24 hr capsule, Take 1 capsule (75 mg total) by mouth 2 (two) times daily., Disp: 60 capsule, Rfl: 11  EXAM:  Filed Vitals:   12/18/14 1058  BP: 124/76  Pulse: 86  Temp: 98.1 F (36.7 C)    Body mass index is 45.2 kg/(m^2).  GENERAL: vitals reviewed and listed above, obese, alert, oriented, appears well hydrated and in no acute distress  HEENT: atraumatic, conjunttiva clear, no obvious abnormalities on inspection of external nose and ears  NECK: no obvious masses on inspection  ABD: several surgical scars, obesity and pannus on inspection, she points to pannus when pointing out this "pooch", I do not appreciate a definite hernia in this area on exam today though advised fatty tissue hinders exam to some extent, no TTP, rebound or guarding  MS: moves all extremities without noticeable abnormality  PSYCH: pleasant and cooperative, no obvious depression or anxiety  ASSESSMENT AND PLAN:  Discussed the following assessment and plan:  Abdominal pain, unspecified abdominal location  -no pain today, points pt pannus as area of concern today -discussed options with CT scan for abd pain recurs, severe or persists as obesity limits exam - she opted to monitor and follow up if recurrent symptoms -follow up with PCP in 1 month -Patient advised to return or notify a doctor immediately if symptoms worsen or persist or new concerns arise.  There are no Patient Instructions on file for this visit.   KColin BentonR.

## 2014-12-18 NOTE — Progress Notes (Signed)
Pre visit review using our clinic review tool, if applicable. No additional management support is needed unless otherwise documented below in the visit note. 

## 2014-12-19 ENCOUNTER — Other Ambulatory Visit: Payer: Self-pay | Admitting: Cardiology

## 2014-12-20 NOTE — Telephone Encounter (Signed)
Rx has been sent to the pharmacy electronically. ° °

## 2014-12-23 ENCOUNTER — Ambulatory Visit: Payer: Medicare Other | Admitting: Internal Medicine

## 2014-12-23 ENCOUNTER — Other Ambulatory Visit: Payer: Self-pay | Admitting: Family Medicine

## 2014-12-27 ENCOUNTER — Ambulatory Visit (INDEPENDENT_AMBULATORY_CARE_PROVIDER_SITE_OTHER): Payer: BLUE CROSS/BLUE SHIELD | Admitting: Family Medicine

## 2014-12-27 VITALS — BP 110/52 | HR 98 | Temp 98.3°F | Wt 270.0 lb

## 2014-12-27 DIAGNOSIS — I5032 Chronic diastolic (congestive) heart failure: Secondary | ICD-10-CM

## 2014-12-27 DIAGNOSIS — D860 Sarcoidosis of lung: Secondary | ICD-10-CM

## 2014-12-27 DIAGNOSIS — E1165 Type 2 diabetes mellitus with hyperglycemia: Secondary | ICD-10-CM

## 2014-12-27 DIAGNOSIS — E785 Hyperlipidemia, unspecified: Secondary | ICD-10-CM

## 2014-12-27 DIAGNOSIS — E1141 Type 2 diabetes mellitus with diabetic mononeuropathy: Secondary | ICD-10-CM | POA: Diagnosis not present

## 2014-12-27 DIAGNOSIS — Z01818 Encounter for other preprocedural examination: Secondary | ICD-10-CM | POA: Diagnosis not present

## 2014-12-27 DIAGNOSIS — E1149 Type 2 diabetes mellitus with other diabetic neurological complication: Secondary | ICD-10-CM

## 2014-12-27 DIAGNOSIS — IMO0002 Reserved for concepts with insufficient information to code with codable children: Secondary | ICD-10-CM

## 2014-12-27 DIAGNOSIS — R0789 Other chest pain: Secondary | ICD-10-CM

## 2014-12-27 NOTE — Progress Notes (Signed)
Garret Reddish, MD Phone: [phone]  Subjective:   [name] is a 54 y.o. year old very pleasant female patient who presents with the following:  Requests surgical clearance for open reduction internal fixation with hardware removal and calcaneal bone grafting of the base of the 5th metatarsal. I have completed an assessment under problem oriented charting of each of main issues that may affect surgery with a summary statement under preoperative clerance.    assessment/plan includes HPI written as S:. Objective written above assssment/plan.   ROS- no chest pain, nausea, vomiting, diarrhea. No hypoglycemia. Denies myalgias. No edema on lasix.   Past Medical History- Patient Active Problem List   Diagnosis Date Noted  . Obstructive chronic bronchitis without exacerbation 09/18/2013    Priority: High  . Chest pain - at rest, and with exertion; -- angiographically normal coronary arteries, mildly elevated LVEDP 04/11/2013    Priority: High  . Chronic diastolic heart failure, NYHA class 2     Priority: High  . Sarcoidosis of lung 04/10/2007    Priority: High  . Type II diabetes mellitus with neurological manifestations, uncontrolled 08/21/2006    Priority: High  . Fatty liver 09/30/2014    Priority: Medium  . Depression     Priority: Medium  . Gout 08/20/2010    Priority: Medium  . Anemia 09/18/2009    Priority: Medium  . Hyperlipidemia 08/21/2006    Priority: Medium  . Essential hypertension 08/21/2006    Priority: Medium  . Hot flashes 07/15/2014    Priority: Low  . Abnormal SPEP 04/17/2014    Priority: Low  . Fracture of left leg 04/17/2014    Priority: Low  . Cushingoid side effect of steroids 04/17/2014    Priority: Low  . Internal hemorrhoids     Priority: Low  . Preoperative clearance 03/25/2014    Priority: Low  . Other and unspecified coagulation defects 04/22/2013    Priority: Low  . Solitary pulmonary nodule, on CT 02/2013 - stable over 2 years in  2015 02/20/2013    Priority: Low  . Chronic cough 12/30/2011    Priority: Low  . Obesity 06/04/2009    Priority: Low  . GERD 08/21/2006    Priority: Low   Medications- reviewed and updated Current Outpatient Prescriptions  Medication Sig Dispense Refill  . allopurinol (ZYLOPRIM) 100 MG tablet Take 100 mg by mouth daily.     [name] aspirin EC 81 MG tablet Take 81 mg by mouth daily.     [name] atorvastatin (LIPITOR) 40 MG tablet Take one tablet by mouth one time daily 90 tablet 3  . B-D ULTRAFINE III SHORT PEN 31G X 8 MM MISC Follow directions provided by physician 30 each 2  . Blood Glucose Monitoring Suppl (FREESTYLE FREEDOM LITE) W/DEVICE KIT Use as adviced 1 each 0  . buPROPion (WELLBUTRIN XL) 300 MG 24 hr tablet Take 1 tablet (300 mg total) by mouth daily. 30 tablet 10  . Calcium Citrate 200 MG TABS Take by mouth.    . CARTIA XT 120 MG 24 hr capsule TAKE ONE CAPSULE BY MOUTH ONE TIME DAILY IN THE EVENING  30 capsule 11  . carvedilol (COREG) 25 MG tablet TAKE ONE TABLET BY MOUTH TWICE DAILY with a meal. 180 tablet 1  . cholecalciferol 5000 UNITS TABS Take 1 tablet (5,000 Units total) by mouth daily. 30 tablet 3  . dexlansoprazole (DEXILANT) 60 MG capsule Take 1 capsule (60 mg total) by mouth daily. 30 capsule 6  .  estradiol (ESTRACE) 2 MG tablet Take by mouth daily.     . fenofibrate (TRICOR) 145 MG tablet TAKE ONE TABLET BY MOUTH ONE TIME DAILY  30 tablet 10  . furosemide (LASIX) 40 MG tablet Take one tablet by mouth twice daily    . gabapentin (NEURONTIN) 100 MG capsule Take 1 capsule (100 mg total) by mouth 3 (three) times daily. 90 capsule 3  . glucose blood (ONE TOUCH ULTRA TEST) test strip 1 each by Other route 3 (three) times daily. Use as instructed 100 each 5  . insulin aspart (NOVOLOG) 100 UNIT/ML injection Inject 10-18 Units into the skin 3 (three) times daily before meals.    . Insulin Glargine (TOUJEO SOLOSTAR) 300 UNIT/ML SOPN Inject 40 Units into the skin at bedtime. 3 pen 2  .  losartan (COZAAR) 100 MG tablet TAKE ONE TABLET BY MOUTH ONE TIME DAILY  90 tablet 1  . metFORMIN (GLUCOPHAGE) 1000 MG tablet Take 1 tablet (1,000 mg total) by mouth 2 (two) times daily with a meal. 180 tablet 3  . mometasone-formoterol (DULERA) 200-5 MCG/ACT AERO Inhale 2 puffs into the lungs 2 (two) times daily. 1 Inhaler 6  . PARoxetine Mesylate (BRISDELLE) 7.5 MG CAPS Take 7.5 mg by mouth daily.    . potassium chloride SA (K-DUR,KLOR-CON) 20 MEQ tablet TAKE ONE TABLET BY MOUTH THREE TIMES DAILY  90 tablet 1  . predniSONE (DELTASONE) 5 MG tablet Take 1 tablet (5 mg total) by mouth daily with breakfast. Sig 332ms daily    . venlafaxine XR (EFFEXOR-XR) 75 MG 24 hr capsule Take 1 capsule (75 mg total) by mouth 2 (two) times daily. 60 capsule 11  . albuterol (PROVENTIL HFA;VENTOLIN HFA) 108 (90 BASE) MCG/ACT inhaler Inhale 1-2 puffs into the lungs every 6 (six) hours as needed for wheezing or shortness of breath. For wheezing.    .Marland Kitchenalbuterol (PROVENTIL) (5 MG/ML) 0.5% nebulizer solution Take 2.5 mg by nebulization every 6 (six) hours as needed. For wheezing.    . benzonatate (TESSALON) 200 MG capsule Take 200 mg by mouth as needed for cough.     . nitroGLYCERIN (NITROSTAT) 0.4 MG SL tablet Place 0.4 mg under the tongue every 5 (five) minutes as needed. x3 doses as needed for chest pain.     No current facility-administered medications for this visit.    Objective: BP 110/52 mmHg  Pulse 98  Temp(Src) 98.3 F (36.8 C)  Wt 270 lb (122.471 kg) Gen: NAD, resting comfortably in chair, cushingoid features CV: RRR no murmurs rubs or gallops Lungs: CTAB no crackles, wheeze, rhonchi Abdomen: soft/nontender/nondistended/normal bowel sounds. No rebound or guarding. obese Ext: trace edema Skin: warm, dry, no rash   Assessment/Plan:  Sarcoidosis of lung COPD S:followed by pulm. She is on Dulera, chronic 550mprednisone down from 32m84mand albuterol prn as well as neb. She is using albuterol less than  once a week on average. Stable.  A/P: Sarcoidosis/COPD> Since the time of her last foot surgery for which she was cleared by pulm. Her symptoms have improved and she has been reduced to lower prednisone dose. She is maximized medically from pulmonary perspective.   Type II diabetes mellitus with neurological manifestations, uncontrolled S: stable/improving. Managed by endocrine primarily Lab Results  Component Value Date   HGBA1C 9.2* 09/16/2014  She has had her lantus changed to toujeo and sh is taking metformin and an increased aspart sliding scle. With these changes fasting sugars range from 120-155. Before meals she is getting 125-130.  No lows.  A/P: Much improved control. I suspect a1c is <8. Given improvement, suspect will be reasonable candidate for operation and wound healing (barriers here are predinsone and diabetes)  Chronic diastolic heart failure, NYHA class 2 S: compliant with lasix. Down 5 lbs from last visit. Likely contributes to shortness of breath. stale A/P: Stable, continue lasix 80m  Chest pain - at rest, and with exertion in 2014 now resolved; -- angiographically normal coronary arteries, mildly elevated LVEDP S: No longer having chest pain that she had 2 years ago. She had a normal stress test at that time but had continued symptoms and risk factors so she had a catheterization at that time which showed "Angiographically normal coronary arteries". improved A/P: No longer having chest pain. Previous extensive eval within 2 years with normal cath. Maximized from this perspective to proceed with surgery.  Hyperlipidemia S: stable Lipids are not ideal but given angiographically normal coronary arteries within 2 years, do not think this would portend poor prognosis for surgery. She has high triglycericdes and slightly high LDL despite Atorvastatin 415m Tricor 14579mWe could consider  Lab Results  Component Value Date   CHOL 211* 12/10/2013   HDL 44.80 12/10/2013    LDLCALC UNABLE TO CALCULATE IF TRIGLYCERIDE OVER 400 mg/dL 05/28/2011   LDLDIRECT 117.9 12/10/2013   TRIG 508.0* 12/10/2013   CHOLHDL 5 12/10/2013  A/P: continue atorvastatin 7m49md tricor despite mild poor control.  Preoperative clearance Since the time of her last surgery at which time she was cleared by pulmonology, patient's pulmonary sympoms/control have improved despite lower prednisone dose. From pulmonary perspective, she is medically maximized. From cardiac perspective, normal cath within 2 years and no longer having chest pain, she is medically maximized for surgery despite slightly poor control of lipids. Her diabetes control I suspect is in the 7-8 range of a1c given newly reported CBGs improved from 9. The main risks for her are her comorbidity which are well controlled and her risk for poor wound healing with diabetes and chronic prednisone. I think despite these she is medically maximized for surgery.

## 2014-12-27 NOTE — Patient Instructions (Addendum)
Given there have been no changes since your last pulmonology clearance and you are no longer having chest pain after previously normal cardiac workup and your diabetes is doing much better, I am going to say you are medically maximized for surgery at this point. Continue current medications. I will try to have this sent out by Monday.

## 2014-12-28 ENCOUNTER — Other Ambulatory Visit: Payer: Self-pay | Admitting: Internal Medicine

## 2014-12-28 ENCOUNTER — Encounter: Payer: Self-pay | Admitting: Family Medicine

## 2014-12-28 NOTE — Assessment & Plan Note (Signed)
S: compliant with lasix. Down 5 lbs from last visit. Likely contributes to shortness of breath.  A/P: Stable, continue lasix

## 2014-12-28 NOTE — Assessment & Plan Note (Signed)
S: stable/improving. Managed by endocrine primarily Lab Results  Component Value Date   HGBA1C 9.2* 09/16/2014  She has had her lantus changed to toujeo and sh is taking metformin and an increased aspart sliding scle. With these changes fasting sugars range from 120-155. Before meals she is getting 125-130. No lows.  A/P: Much improved control. I suspect a1c is <8. Given improvement, suspect will be reasonable candidate for operation and wound healing (barriers here are predinsone and diabetes)

## 2014-12-28 NOTE — Assessment & Plan Note (Addendum)
S:followed by pulm. She is on Dulera, chronic 5mg  prednisone down from 7mg , and albuterol prn as well as neb. She is using albuterol less than once a week on average. Stable.  A/P: Sarcoidosis/COPD> Since the time of her last foot surgery for which she was cleared by pulm. Her symptoms have improved and she has been reduced to lower prednisone dose. She is maximized medically from pulmonary perspective.

## 2014-12-28 NOTE — Assessment & Plan Note (Signed)
Since the time of her last surgery at which time she was cleared by pulmonology, patient's pulmonary sympoms/control have improved despite lower prednisone dose. From pulmonary perspective, she is medically maximized. From cardiac perspective, normal cath within 2 years and no longer having chest pain, she is medically maximized for surgery despite slightly poor control of lipids. Her diabetes control I suspect is in the 7-8 range of a1c given newly reported CBGs improved from 9. The main risks for her are her comorbidity which are well controlled and her risk for poor wound healing with diabetes and chronic prednisone. I think despite these she is medically maximized for surgery.

## 2014-12-28 NOTE — Assessment & Plan Note (Signed)
S: stable Lipids are not ideal but given angiographically normal coronary arteries within 2 years, do not think this would portend poor prognosis for surgery. She has high triglycericdes and slightly high LDL despite Atorvastatin 40mg , Tricor 145mg . We could consider  Lab Results  Component Value Date   CHOL 211* 12/10/2013   HDL 44.80 12/10/2013   LDLCALC UNABLE TO CALCULATE IF TRIGLYCERIDE OVER 400 mg/dL 05/28/2011   LDLDIRECT 117.9 12/10/2013   TRIG 508.0* 12/10/2013   CHOLHDL 5 12/10/2013  A/P: continue atorvastatin 40mg  and tricor despite mild poor control.

## 2014-12-28 NOTE — Assessment & Plan Note (Signed)
S: No longer having chest pain that she had 2 years ago. She had a normal stress test at that time but had continued symptoms and risk factors so she had a catheterization at that time which showed "Angiographically normal coronary arteries" A/P: No longer having chest pain. Previous extensive eval within 2 years with normal cath. Maximized from this perspective to proceed with surgery.

## 2014-12-31 DIAGNOSIS — M1712 Unilateral primary osteoarthritis, left knee: Secondary | ICD-10-CM | POA: Diagnosis not present

## 2015-01-04 DIAGNOSIS — J040 Acute laryngitis: Secondary | ICD-10-CM | POA: Diagnosis not present

## 2015-01-13 DIAGNOSIS — Q661 Congenital talipes calcaneovarus: Secondary | ICD-10-CM | POA: Diagnosis not present

## 2015-01-13 DIAGNOSIS — J42 Unspecified chronic bronchitis: Secondary | ICD-10-CM | POA: Diagnosis not present

## 2015-01-13 DIAGNOSIS — M79672 Pain in left foot: Secondary | ICD-10-CM | POA: Diagnosis not present

## 2015-01-13 DIAGNOSIS — J449 Chronic obstructive pulmonary disease, unspecified: Secondary | ICD-10-CM | POA: Diagnosis not present

## 2015-01-13 DIAGNOSIS — Z79899 Other long term (current) drug therapy: Secondary | ICD-10-CM | POA: Diagnosis not present

## 2015-01-13 DIAGNOSIS — Z9049 Acquired absence of other specified parts of digestive tract: Secondary | ICD-10-CM | POA: Diagnosis not present

## 2015-01-13 DIAGNOSIS — I10 Essential (primary) hypertension: Secondary | ICD-10-CM | POA: Diagnosis not present

## 2015-01-13 DIAGNOSIS — E1140 Type 2 diabetes mellitus with diabetic neuropathy, unspecified: Secondary | ICD-10-CM | POA: Diagnosis not present

## 2015-01-13 DIAGNOSIS — Z90710 Acquired absence of both cervix and uterus: Secondary | ICD-10-CM | POA: Diagnosis not present

## 2015-01-13 DIAGNOSIS — D649 Anemia, unspecified: Secondary | ICD-10-CM | POA: Diagnosis not present

## 2015-01-13 DIAGNOSIS — Z7952 Long term (current) use of systemic steroids: Secondary | ICD-10-CM | POA: Diagnosis not present

## 2015-01-13 DIAGNOSIS — K760 Fatty (change of) liver, not elsewhere classified: Secondary | ICD-10-CM | POA: Diagnosis not present

## 2015-01-13 DIAGNOSIS — G8918 Other acute postprocedural pain: Secondary | ICD-10-CM | POA: Diagnosis not present

## 2015-01-13 DIAGNOSIS — Z79891 Long term (current) use of opiate analgesic: Secondary | ICD-10-CM | POA: Diagnosis not present

## 2015-01-13 DIAGNOSIS — R2681 Unsteadiness on feet: Secondary | ICD-10-CM | POA: Diagnosis not present

## 2015-01-13 DIAGNOSIS — K219 Gastro-esophageal reflux disease without esophagitis: Secondary | ICD-10-CM | POA: Diagnosis not present

## 2015-01-13 DIAGNOSIS — F329 Major depressive disorder, single episode, unspecified: Secondary | ICD-10-CM | POA: Diagnosis not present

## 2015-01-13 DIAGNOSIS — T8489XA Other specified complication of internal orthopedic prosthetic devices, implants and grafts, initial encounter: Secondary | ICD-10-CM | POA: Diagnosis not present

## 2015-01-13 DIAGNOSIS — E780 Pure hypercholesterolemia: Secondary | ICD-10-CM | POA: Diagnosis not present

## 2015-01-13 DIAGNOSIS — S92352K Displaced fracture of fifth metatarsal bone, left foot, subsequent encounter for fracture with nonunion: Secondary | ICD-10-CM | POA: Diagnosis not present

## 2015-01-13 DIAGNOSIS — M109 Gout, unspecified: Secondary | ICD-10-CM | POA: Diagnosis not present

## 2015-01-13 DIAGNOSIS — Z794 Long term (current) use of insulin: Secondary | ICD-10-CM | POA: Diagnosis not present

## 2015-01-13 DIAGNOSIS — D869 Sarcoidosis, unspecified: Secondary | ICD-10-CM | POA: Diagnosis not present

## 2015-01-14 DIAGNOSIS — E1140 Type 2 diabetes mellitus with diabetic neuropathy, unspecified: Secondary | ICD-10-CM | POA: Diagnosis not present

## 2015-01-14 DIAGNOSIS — E780 Pure hypercholesterolemia: Secondary | ICD-10-CM | POA: Diagnosis not present

## 2015-01-14 DIAGNOSIS — J42 Unspecified chronic bronchitis: Secondary | ICD-10-CM | POA: Diagnosis not present

## 2015-01-14 DIAGNOSIS — S92352K Displaced fracture of fifth metatarsal bone, left foot, subsequent encounter for fracture with nonunion: Secondary | ICD-10-CM | POA: Diagnosis not present

## 2015-01-14 DIAGNOSIS — T8489XA Other specified complication of internal orthopedic prosthetic devices, implants and grafts, initial encounter: Secondary | ICD-10-CM | POA: Diagnosis not present

## 2015-01-14 DIAGNOSIS — Q661 Congenital talipes calcaneovarus: Secondary | ICD-10-CM | POA: Diagnosis not present

## 2015-01-16 DIAGNOSIS — S92355D Nondisplaced fracture of fifth metatarsal bone, left foot, subsequent encounter for fracture with routine healing: Secondary | ICD-10-CM | POA: Diagnosis not present

## 2015-01-21 IMAGING — CR DG CHEST 2V
2 series · 2 of 2 positions shown · non-contrast
Comparison: 12/02/2013

CLINICAL DATA: Shortness of breath

EXAM:
CHEST  2 VIEW

[w chest pa]
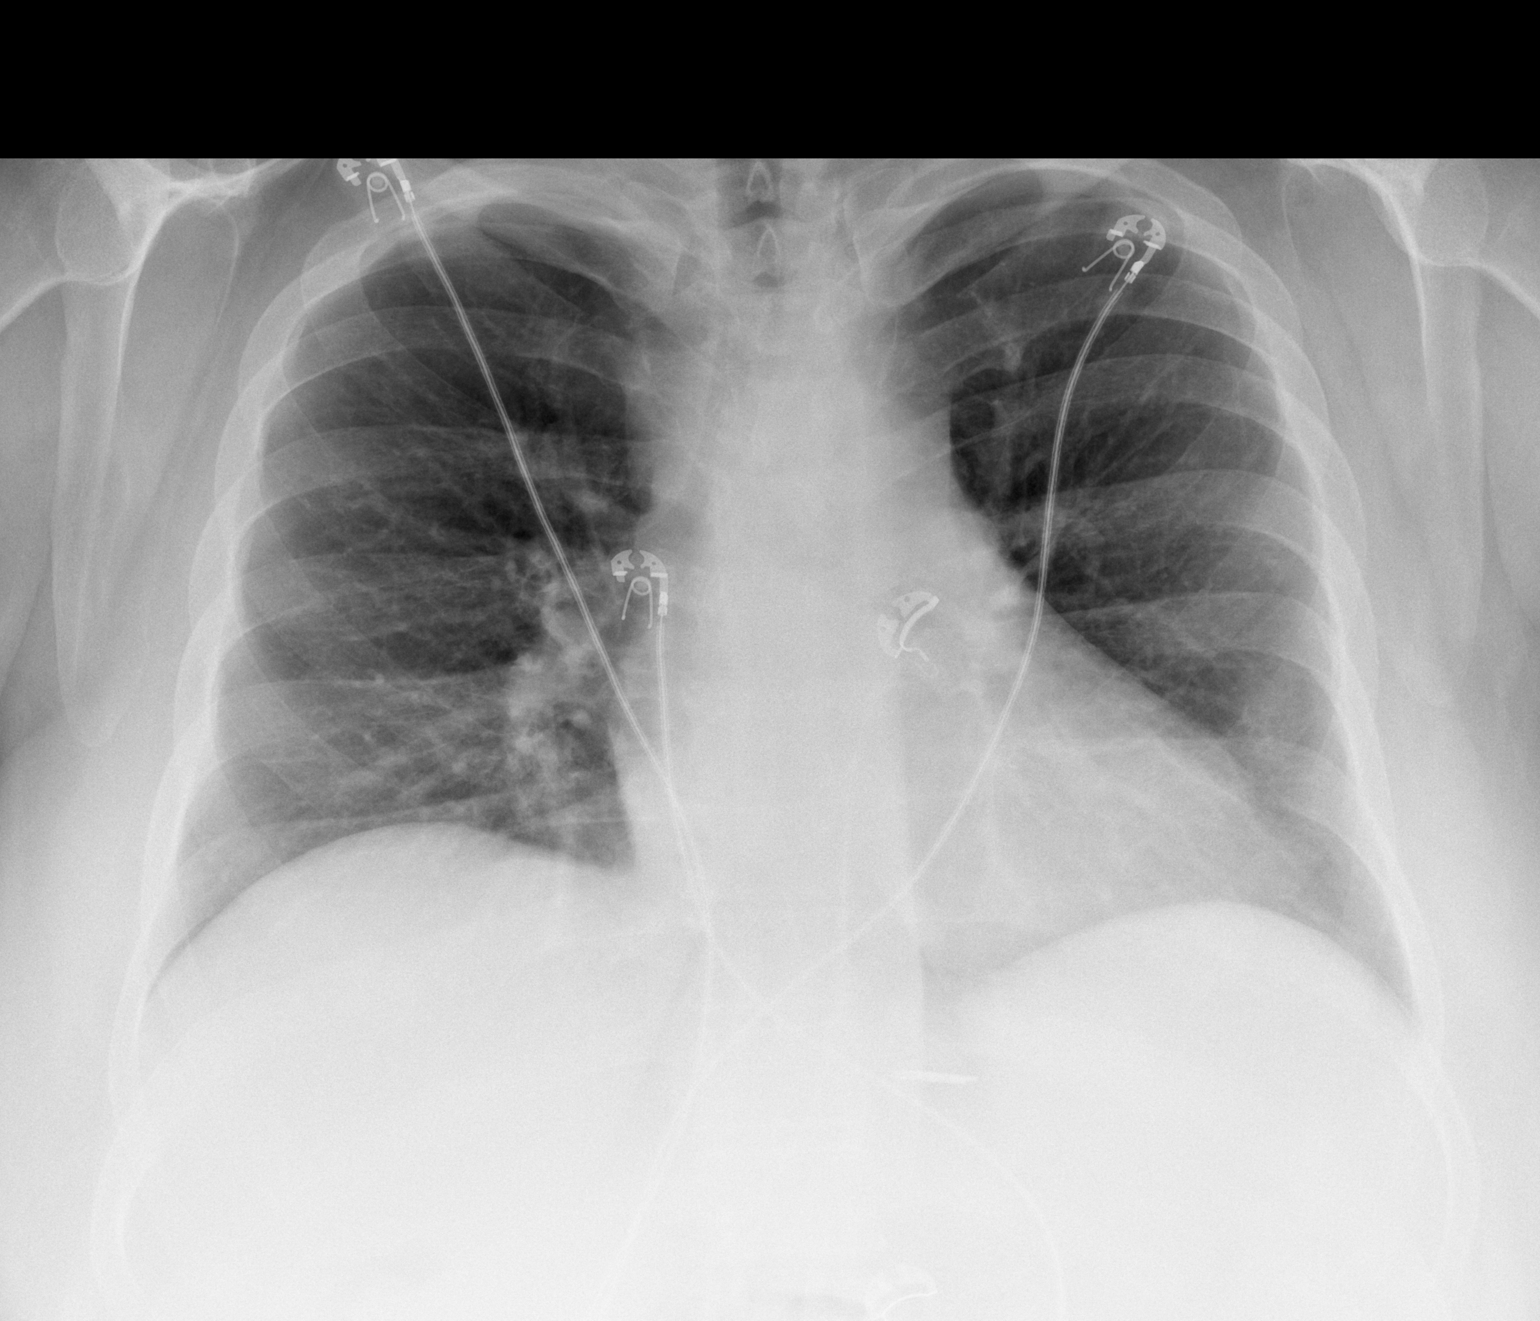

[w chest lat]
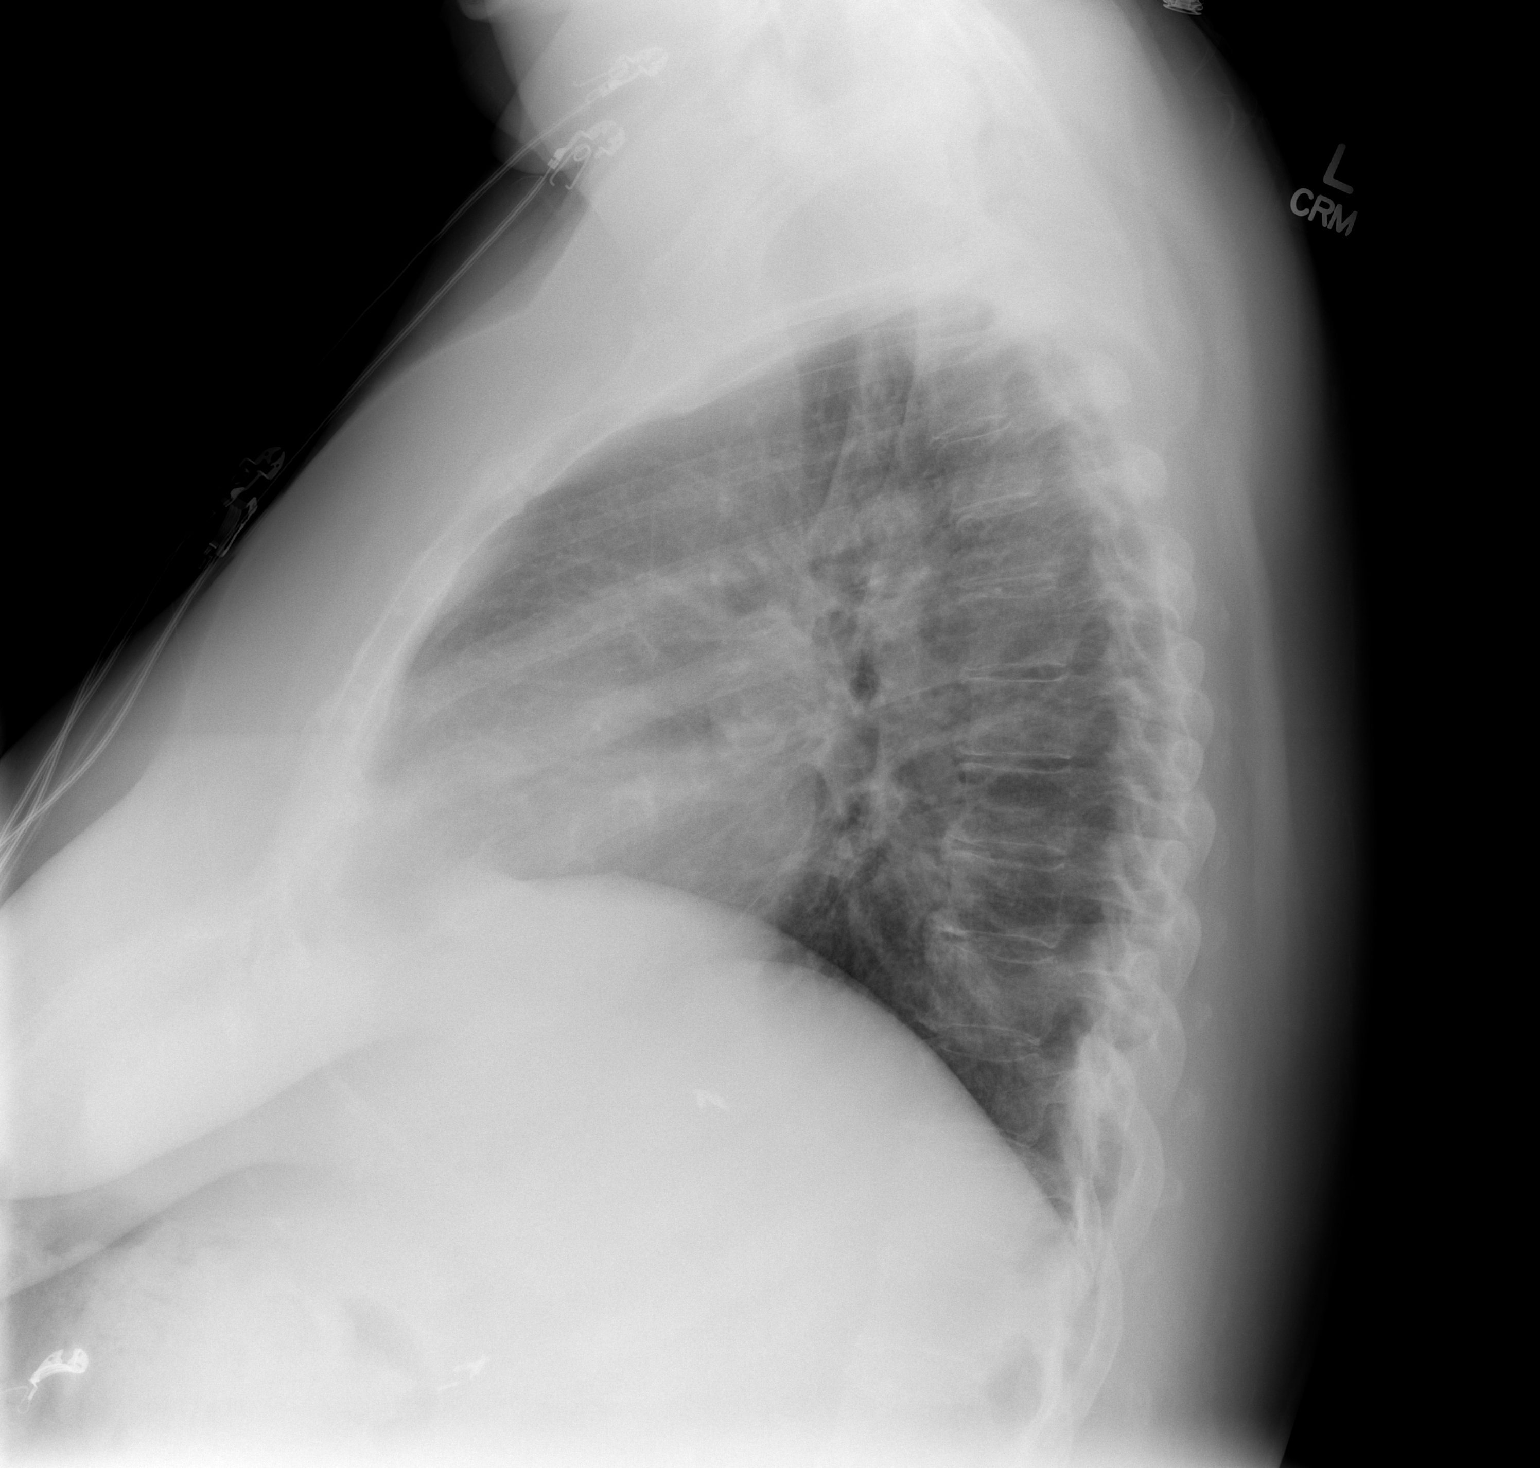

[2 of 2 positions shown; findings below may reference images not displayed]

FINDINGS: Stable mild cardiomegaly. No change in upper mediastinal contours.
There is chronic pulmonary hypoaeration. Increased density at the
medial right base correlates with mediastinum fat on preceding CT.
No consolidation, edema, effusion, or pneumothorax.
IMPRESSION: No active cardiopulmonary disease.

## 2015-02-02 ENCOUNTER — Other Ambulatory Visit: Payer: Self-pay | Admitting: Family

## 2015-02-13 ENCOUNTER — Other Ambulatory Visit: Payer: Self-pay | Admitting: Internal Medicine

## 2015-02-18 ENCOUNTER — Ambulatory Visit: Payer: Medicare Other | Admitting: Family Medicine

## 2015-02-25 ENCOUNTER — Ambulatory Visit: Payer: Medicare Other | Admitting: Family Medicine

## 2015-02-26 ENCOUNTER — Ambulatory Visit: Payer: Medicare Other | Admitting: Internal Medicine

## 2015-02-27 IMAGING — CR DG BONE SURVEY MET
7 of 10 series · 7 of 10 positions shown · non-contrast
Comparison: None.

CLINICAL DATA: Staging multiple myeloma

EXAM:
METASTATIC BONE SURVEY

[w chest pa]
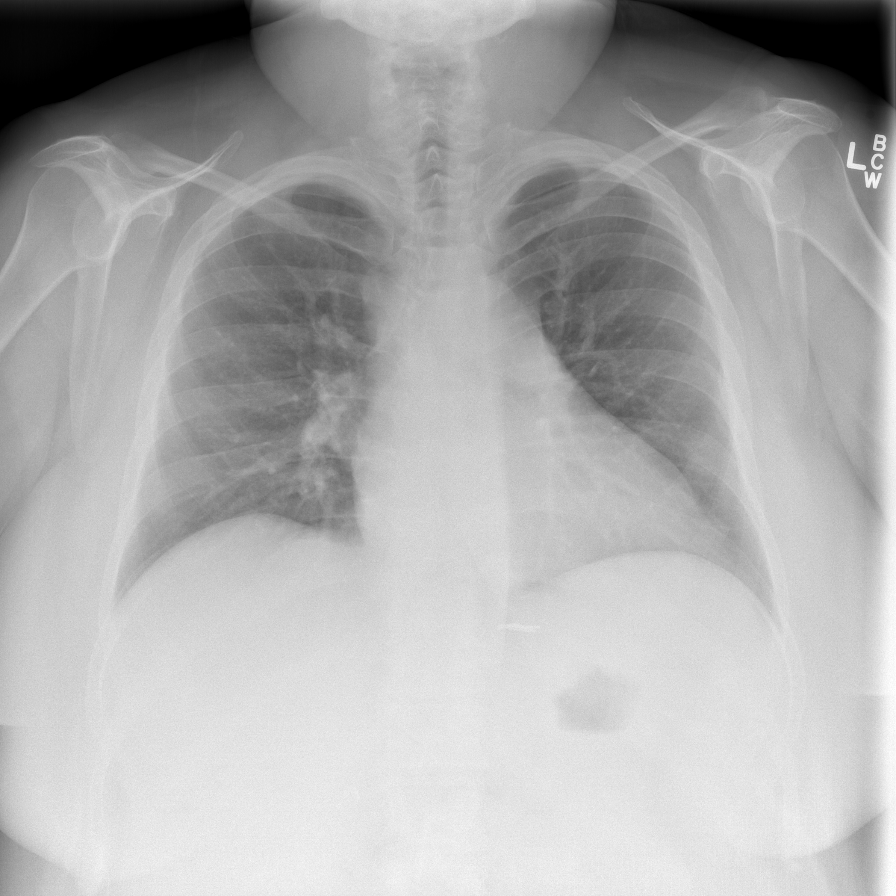

[w c-spine lat]
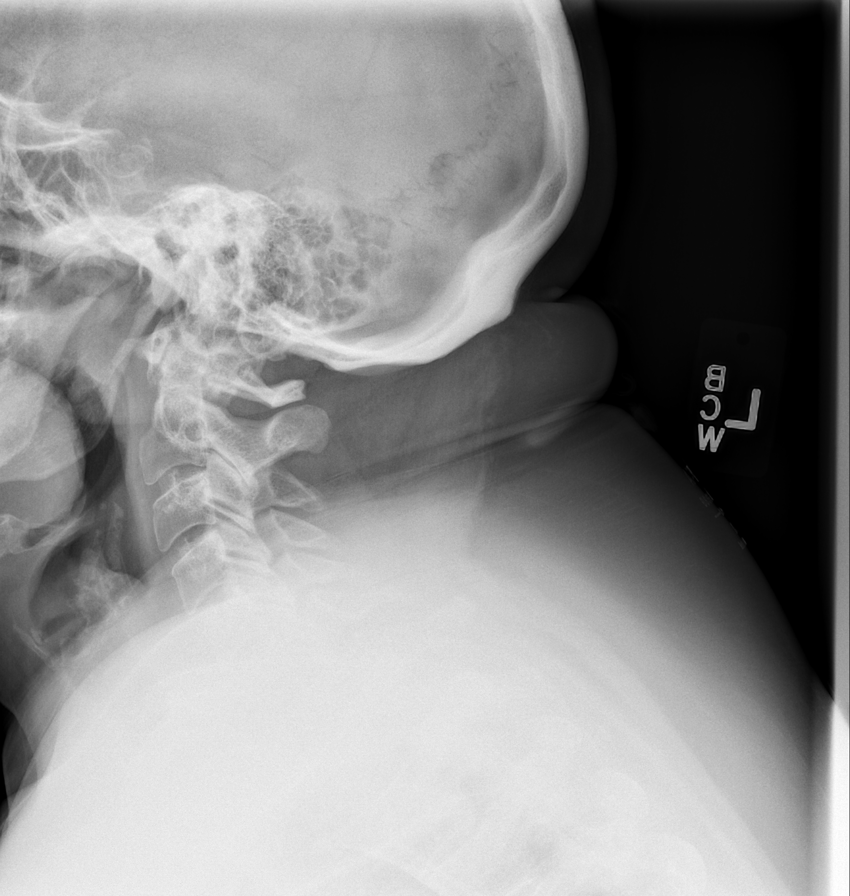

[w skull lat]
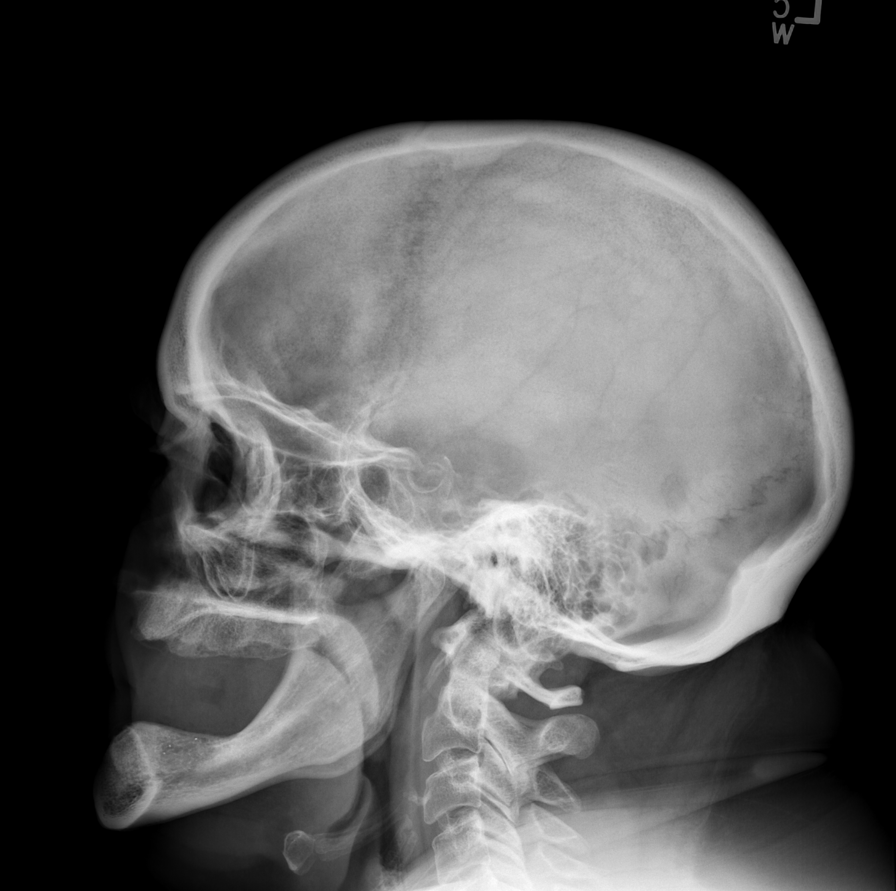

[w humerus ap left]
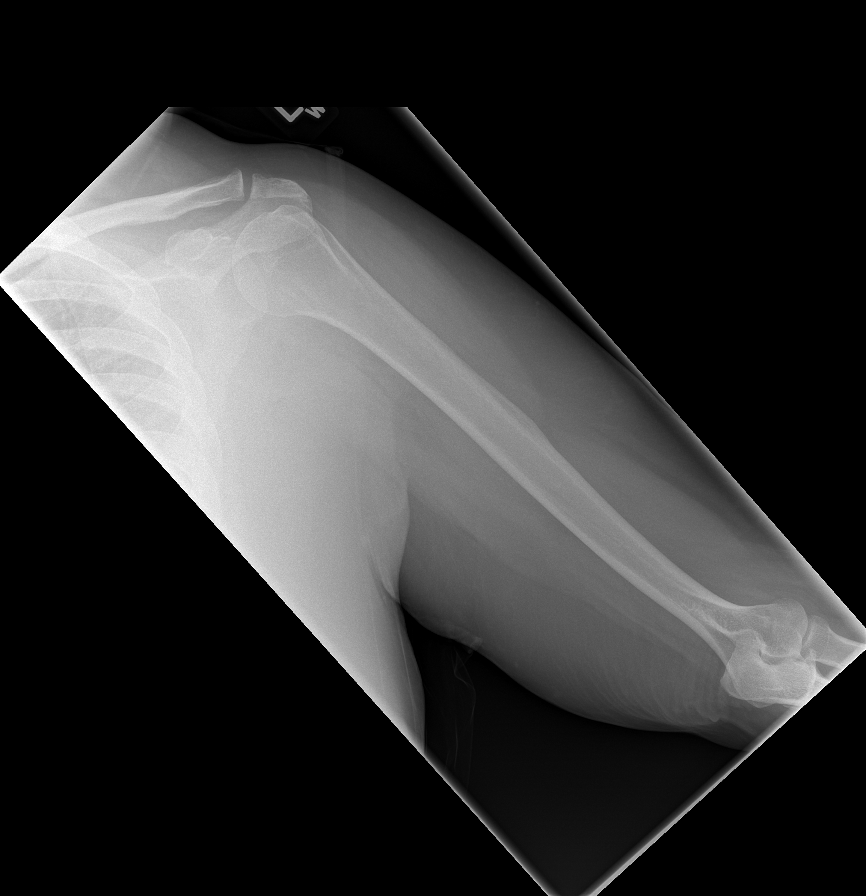

[w forearm ap left]
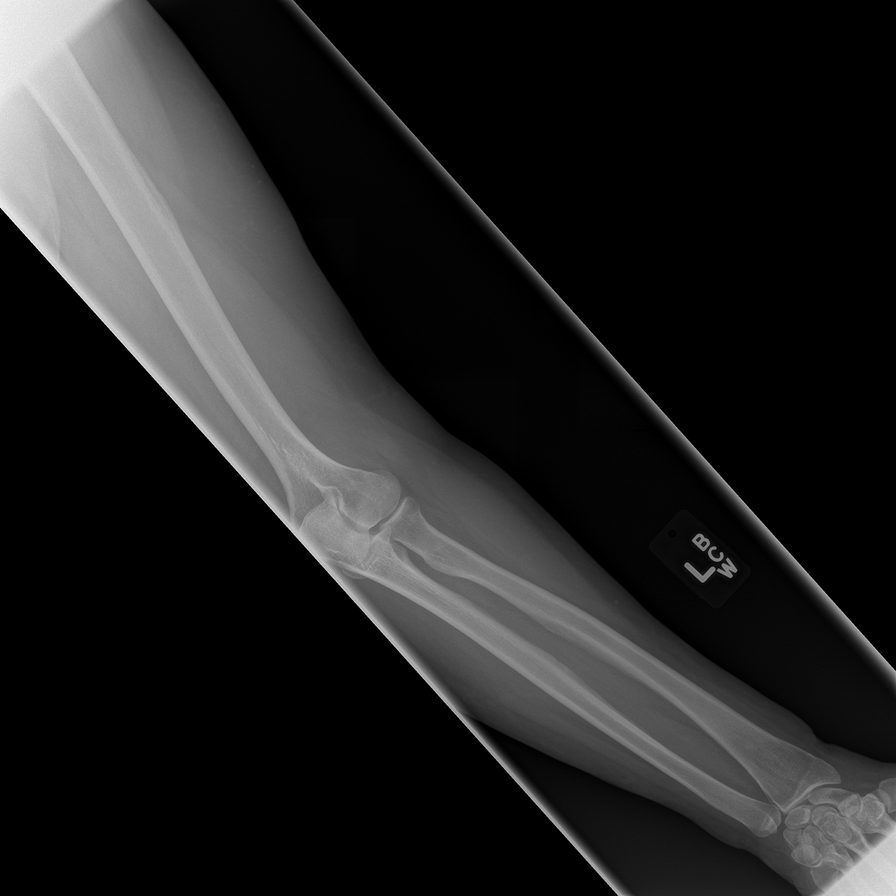

[w humerus ap right]
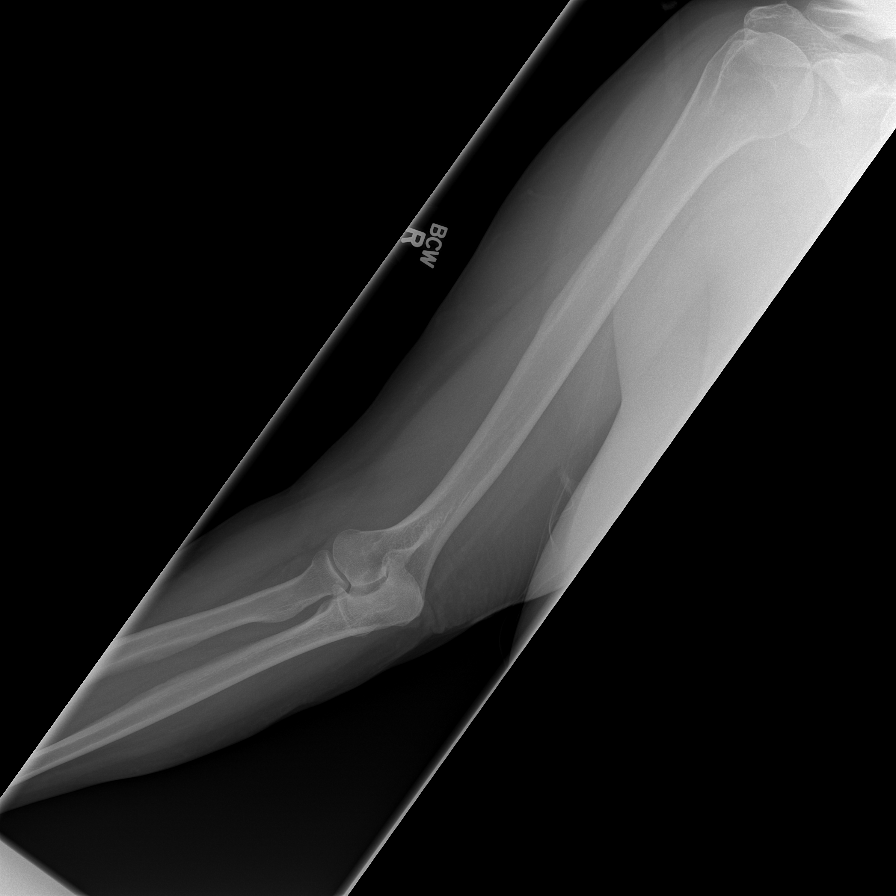

[w forearm ap right]
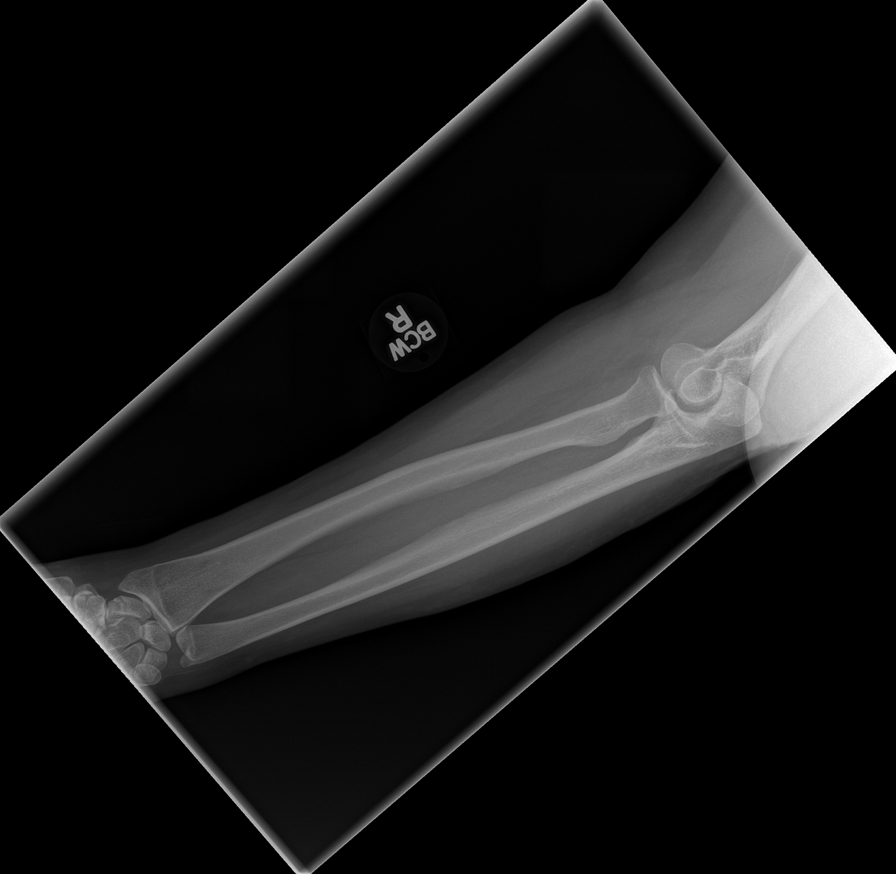

[7 of 10 positions shown; findings below may reference images not displayed]

FINDINGS: No lytic or sclerotic bone lesions are seen. No evidence of skeletal
myeloma.

Mild degenerative change in the spine without fracture. Prior
ventral hernia repair with mesh.

Degenerative change is present in the knees bilaterally. Left ankle
cast in place without fracture identified.
IMPRESSION: Negative for fracture.  No lytic skeletal lesions.

## 2015-03-03 ENCOUNTER — Ambulatory Visit (INDEPENDENT_AMBULATORY_CARE_PROVIDER_SITE_OTHER): Payer: BLUE CROSS/BLUE SHIELD | Admitting: Critical Care Medicine

## 2015-03-03 ENCOUNTER — Encounter: Payer: Self-pay | Admitting: Critical Care Medicine

## 2015-03-03 VITALS — BP 122/60 | HR 81 | Temp 97.0°F | Ht 66.0 in | Wt 278.3 lb

## 2015-03-03 DIAGNOSIS — J449 Chronic obstructive pulmonary disease, unspecified: Secondary | ICD-10-CM | POA: Diagnosis not present

## 2015-03-03 MED ORDER — HYDROCOD POLST-CPM POLST ER 10-8 MG/5ML PO SUER: 5.0000 mL | Freq: Four times a day (QID) | ORAL | Status: DC | PRN

## 2015-03-03 NOTE — Progress Notes (Signed)
Subjective:    Patient ID: [name], female    DOB: 1961/04/29, 54 y.o.   MRN: [id number]  HPI 03/03/2015 Chief Complaint  Patient presents with  . Follow-up    Last seen 08/26/14. C/O non-productive cough, PND, headache x 2-3 days. Denies fever, chest pain, tightness or wheezing. Recent foot surgery 01/2015     Pt doing well, notes mild sinus issue, notes some pndrip.  Cough is dry.  Notes some headaches.  No f/c/s.  Recent LLE foot surgery 01/2015.  Pt denies any significant sore throat, nasal congestion or excess secretions, fever, chills, sweats, unintended weight loss, pleurtic or exertional chest pain, orthopnea PND, or leg swelling Pt denies any increase in rescue therapy over baseline, denies waking up needing it or having any early am or nocturnal exacerbations of coughing/wheezing/or dyspnea. Pt also denies any obvious fluctuation in symptoms with  weather or environmental change or other alleviating or aggravating factors   Current Medications, Allergies, Complete Past Medical History, Past Surgical History, Family History, and Social History were reviewed in [employer] record.  Past Medical History  Diagnosis Date  . DIABETES MELLITUS, TYPE II 08/21/2006  . HYPERLIPIDEMIA 08/21/2006  . GOUT 08/20/2010  . OBESITY 06/04/2009  . ANEMIA-UNSPECIFIED 09/18/2009  . HYPERTENSION 08/21/2006  . GERD 08/21/2006  . SLEEP APNEA 04/21/2009    last testing was negative  . Internal hemorrhoids   . Pulmonary sarcoidosis     Followed locally by pulmonology, but also by Dr. Casper Harrison at [facility]  . Exertional chest pain     sharp, substernal, exertional  . Vocal cord dysfunction   . Chronic diastolic heart failure, NYHA class 2     LVEDP roughly 20% by cath  . CHF (congestive heart failure)   . COPD (chronic obstructive pulmonary disease)   . Depression   . Fracture of 5th metatarsal     non union  . Abnormal SPEP 04/17/2014     Family  History  Problem Relation Age of Onset  . Diabetes Father   . Heart attack Father   . Coronary artery disease Father   . Heart failure Father   . COPD Mother   . Emphysema Mother   . Asthma Mother   . Heart failure Mother   . Sarcoidosis Maternal Uncle   . Colon cancer Neg Hx   . Lung cancer Brother   . Cancer Brother   . Diabetes Brother   . Heart attack Maternal Grandfather      History   Social History  . Marital Status: Married    Spouse Name: ITXEL WICKARD  . Number of Children: 2  . Years of Education: 12   Occupational History  . DISABLED    Social History Main Topics  . Smoking status: Never Smoker   . Smokeless tobacco: Never Used  . Alcohol Use: No  . Drug Use: No  . Sexual Activity: Yes    Birth Control/ Protection: Surgical   Other Topics Concern  . Not on file   Social History Narrative   Married 1994. 2 sons who both live close and 1 grandson.       Disability due to sarcoidosis. 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 Lisinopril Cough  . Methotrexate  REACTION: peri-oral and buccal lesions.  . Clindamycin/Lincomycin Nausea And Vomiting and Rash     Outpatient Prescriptions Prior to Visit  Medication Sig Dispense Refill  . albuterol (PROVENTIL HFA;VENTOLIN HFA) 108 (90 BASE) MCG/ACT inhaler Inhale 1-2 puffs into the lungs every 6 (six) hours as needed for wheezing or shortness of breath. For wheezing.    [name] albuterol (PROVENTIL) (5 MG/ML) 0.5% nebulizer solution Take 2.5 mg by nebulization every 6 (six) hours as needed. For wheezing.    [name] allopurinol (ZYLOPRIM) 100 MG tablet Take 100 mg by mouth daily.     [name] aspirin EC 81 MG tablet Take 81 mg by mouth daily.     [name] atorvastatin (LIPITOR) 40 MG tablet Take one tablet by mouth one time daily 90 tablet 3  . B-D  ULTRAFINE III SHORT PEN 31G X 8 MM MISC Follow directions provided by physician 30 each 2  . benzonatate (TESSALON) 200 MG capsule Take 200 mg by mouth as needed for cough.     . Blood Glucose Monitoring Suppl (FREESTYLE FREEDOM LITE) W/DEVICE KIT Use as adviced 1 each 0  . buPROPion (WELLBUTRIN XL) 300 MG 24 hr tablet Take 1 tablet (300 mg total) by mouth daily. 30 tablet 10  . Calcium Citrate 200 MG TABS Take by mouth.    . CARTIA XT 120 MG 24 hr capsule TAKE ONE CAPSULE BY MOUTH ONE TIME DAILY IN THE EVENING  30 capsule 11  . carvedilol (COREG) 25 MG tablet TAKE ONE TABLET BY MOUTH TWICE DAILY with a meal. 180 tablet 1  . cholecalciferol 5000 UNITS TABS Take 1 tablet (5,000 Units total) by mouth daily. 30 tablet 3  . dexlansoprazole (DEXILANT) 60 MG capsule Take 1 capsule (60 mg total) by mouth daily. 30 capsule 6  . estradiol (ESTRACE) 2 MG tablet Take by mouth daily.     . fenofibrate (TRICOR) 145 MG tablet TAKE ONE TABLET BY MOUTH ONE TIME DAILY  30 tablet 10  . fluticasone (FLONASE) 50 MCG/ACT nasal spray USE TWO SPRAYS IN EACH NOSTRIL DAILY 16 g 4  . furosemide (LASIX) 40 MG tablet Take one tablet by mouth twice daily    . gabapentin (NEURONTIN) 100 MG capsule TAKE ONE CAPSULE BY MOUTH THREE TIMES DAILY 90 capsule 3  . glucose blood (ONE TOUCH ULTRA TEST) test strip 1 each by Other route 3 (three) times daily. Use as instructed 100 each 5  . insulin aspart (NOVOLOG) 100 UNIT/ML injection Inject 10-18 Units into the skin 3 (three) times daily before meals.    . Insulin Glargine (TOUJEO SOLOSTAR) 300 UNIT/ML SOPN Inject 40 Units into the skin at bedtime. 3 pen 2  . losartan (COZAAR) 100 MG tablet TAKE ONE TABLET BY MOUTH ONE TIME DAILY  90 tablet 1  . metFORMIN (GLUCOPHAGE) 1000 MG tablet TAKE ONE TABLET BY MOUTH TWICE DAILY with a meal. 180 tablet 2  . mometasone-formoterol (DULERA) 200-5 MCG/ACT AERO Inhale 2 puffs into the lungs 2 (two) times daily. 1 Inhaler 6  . nitroGLYCERIN  (NITROSTAT) 0.4 MG SL tablet Place 0.4 mg under the tongue every 5 (five) minutes as needed. x3 doses as needed for chest pain.    [name] PARoxetine Mesylate (BRISDELLE) 7.5 MG CAPS Take 7.5 mg by mouth daily.    . potassium chloride SA (K-DUR,KLOR-CON) 20 MEQ tablet TAKE ONE TABLET BY MOUTH THREE TIMES DAILY  90 tablet 1  . predniSONE (DELTASONE) 5 MG tablet Take 1 tablet (5 mg total) by mouth daily  with breakfast. Sig 53ms daily    . venlafaxine XR (EFFEXOR-XR) 75 MG 24 hr capsule Take 1 capsule (75 mg total) by mouth 2 (two) times daily. 60 capsule 11   No facility-administered medications prior to visit.         Review of Systems     Objective:   Physical Exam        Assessment & Plan:

## 2015-03-03 NOTE — Patient Instructions (Signed)
No change in medications. Return in        6 months        

## 2015-03-03 NOTE — Progress Notes (Signed)
Subjective:    Patient ID: [name], female    DOB: 08/12/1961, 54 y.o.   MRN: [id number]  HPI 03/03/2015 Chief Complaint  Patient presents with  . Follow-up    Last seen 08/26/14. C/O non-productive cough, PND, headache x 2-3 days. Denies fever, chest pain, tightness or wheezing. Recent foot surgery 01/2015     Patient overall is doing well with no new complaints. She does have a dry cough. There is some postnasal drainage. She denies any fever chills or sweats. There is no reflux. She is seen in follow-up. Pt denies any significant sore throat, nasal congestion or excess secretions, fever, chills, sweats, unintended weight loss, pleurtic or exertional chest pain, orthopnea PND, or leg swelling Pt denies any increase in rescue therapy over baseline, denies waking up needing it or having any early am or nocturnal exacerbations of coughing/wheezing/or dyspnea. Pt also denies any obvious fluctuation in symptoms with  weather or environmental change or other alleviating or aggravating factors   Current Medications, Allergies, Complete Past Medical History, Past Surgical History, Family History, and Social History were reviewed in [employer] record.  Past Medical History  Diagnosis Date  . DIABETES MELLITUS, TYPE II 08/21/2006  . HYPERLIPIDEMIA 08/21/2006  . GOUT 08/20/2010  . OBESITY 06/04/2009  . ANEMIA-UNSPECIFIED 09/18/2009  . HYPERTENSION 08/21/2006  . GERD 08/21/2006  . SLEEP APNEA 04/21/2009    last testing was negative  . Internal hemorrhoids   . Pulmonary sarcoidosis     Followed locally by pulmonology, but also by Dr. Casper Harrison at [facility]  . Exertional chest pain     sharp, substernal, exertional  . Vocal cord dysfunction   . Chronic diastolic heart failure, NYHA class 2     LVEDP roughly 20% by cath  . CHF (congestive heart failure)   . COPD (chronic obstructive pulmonary disease)   . Depression   . Fracture of 5th metatarsal      non union  . Abnormal SPEP 04/17/2014     Family History  Problem Relation Age of Onset  . Diabetes Father   . Heart attack Father   . Coronary artery disease Father   . Heart failure Father   . COPD Mother   . Emphysema Mother   . Asthma Mother   . Heart failure Mother   . Sarcoidosis Maternal Uncle   . Colon cancer Neg Hx   . Lung cancer Brother   . Cancer Brother   . Diabetes Brother   . Heart attack Maternal Grandfather      History   Social History  . Marital Status: Married    Spouse Name: MCKELL RIECKE  . Number of Children: 2  . Years of Education: 12   Occupational History  . DISABLED    Social History Main Topics  . Smoking status: Never Smoker   . Smokeless tobacco: Never Used  . Alcohol Use: No  . Drug Use: No  . Sexual Activity: Yes    Birth Control/ Protection: Surgical   Other Topics Concern  . Not on file   Social History Narrative   Married 1994. 2 sons who both live close and 1 grandson.       Disability due to sarcoidosis. 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  Lisinopril Cough  . Methotrexate     REACTION: peri-oral and buccal lesions.  . Clindamycin/Lincomycin Nausea And Vomiting and Rash     Outpatient Prescriptions Prior to Visit  Medication Sig Dispense Refill  . albuterol (PROVENTIL HFA;VENTOLIN HFA) 108 (90 BASE) MCG/ACT inhaler Inhale 1-2 puffs into the lungs every 6 (six) hours as needed for wheezing or shortness of breath. For wheezing.    [name] albuterol (PROVENTIL) (5 MG/ML) 0.5% nebulizer solution Take 2.5 mg by nebulization every 6 (six) hours as needed. For wheezing.    [name] allopurinol (ZYLOPRIM) 100 MG tablet Take 100 mg by mouth daily.     [name] aspirin EC 81 MG tablet Take 81 mg by mouth daily.     [name] atorvastatin (LIPITOR) 40 MG tablet Take  one tablet by mouth one time daily 90 tablet 3  . B-D ULTRAFINE III SHORT PEN 31G X 8 MM MISC Follow directions provided by physician 30 each 2  . benzonatate (TESSALON) 200 MG capsule Take 200 mg by mouth as needed for cough.     . Blood Glucose Monitoring Suppl (FREESTYLE FREEDOM LITE) W/DEVICE KIT Use as adviced 1 each 0  . buPROPion (WELLBUTRIN XL) 300 MG 24 hr tablet Take 1 tablet (300 mg total) by mouth daily. 30 tablet 10  . Calcium Citrate 200 MG TABS Take by mouth.    . CARTIA XT 120 MG 24 hr capsule TAKE ONE CAPSULE BY MOUTH ONE TIME DAILY IN THE EVENING  30 capsule 11  . carvedilol (COREG) 25 MG tablet TAKE ONE TABLET BY MOUTH TWICE DAILY with a meal. 180 tablet 1  . cholecalciferol 5000 UNITS TABS Take 1 tablet (5,000 Units total) by mouth daily. 30 tablet 3  . dexlansoprazole (DEXILANT) 60 MG capsule Take 1 capsule (60 mg total) by mouth daily. 30 capsule 6  . estradiol (ESTRACE) 2 MG tablet Take by mouth daily.     . fenofibrate (TRICOR) 145 MG tablet TAKE ONE TABLET BY MOUTH ONE TIME DAILY  30 tablet 10  . fluticasone (FLONASE) 50 MCG/ACT nasal spray USE TWO SPRAYS IN EACH NOSTRIL DAILY 16 g 4  . furosemide (LASIX) 40 MG tablet Take one tablet by mouth twice daily    . gabapentin (NEURONTIN) 100 MG capsule TAKE ONE CAPSULE BY MOUTH THREE TIMES DAILY 90 capsule 3  . glucose blood (ONE TOUCH ULTRA TEST) test strip 1 each by Other route 3 (three) times daily. Use as instructed 100 each 5  . insulin aspart (NOVOLOG) 100 UNIT/ML injection Inject 10-18 Units into the skin 3 (three) times daily before meals.    . Insulin Glargine (TOUJEO SOLOSTAR) 300 UNIT/ML SOPN Inject 40 Units into the skin at bedtime. 3 pen 2  . losartan (COZAAR) 100 MG tablet TAKE ONE TABLET BY MOUTH ONE TIME DAILY  90 tablet 1  . metFORMIN (GLUCOPHAGE) 1000 MG tablet TAKE ONE TABLET BY MOUTH TWICE DAILY with a meal. 180 tablet 2  . mometasone-formoterol (DULERA) 200-5 MCG/ACT AERO Inhale 2 puffs into the lungs 2  (two) times daily. 1 Inhaler 6  . nitroGLYCERIN (NITROSTAT) 0.4 MG SL tablet Place 0.4 mg under the tongue every 5 (five) minutes as needed. x3 doses as needed for chest pain.    [name] PARoxetine Mesylate (BRISDELLE) 7.5 MG CAPS Take 7.5 mg by mouth daily.    . potassium chloride SA (K-DUR,KLOR-CON) 20 MEQ tablet TAKE ONE TABLET BY MOUTH THREE TIMES DAILY  90 tablet 1  . predniSONE (DELTASONE) 5 MG tablet  Take 1 tablet (5 mg total) by mouth daily with breakfast. Sig 57ms daily    . venlafaxine XR (EFFEXOR-XR) 75 MG 24 hr capsule Take 1 capsule (75 mg total) by mouth 2 (two) times daily. 60 capsule 11   No facility-administered medications prior to visit.         Review of Systems  Constitutional: Negative for fever, chills, diaphoresis, appetite change, fatigue and unexpected weight change.  HENT: Positive for postnasal drip. Negative for congestion, ear discharge, ear pain, hearing loss, nosebleeds, rhinorrhea, sinus pressure, sneezing, sore throat, trouble swallowing and voice change.   Eyes: Negative for discharge and itching.  Respiratory: Positive for cough and shortness of breath. Negative for apnea, choking, chest tightness, wheezing and stridor.   Cardiovascular: Negative for chest pain, palpitations and leg swelling.  Gastrointestinal: Negative for nausea, vomiting, abdominal pain and abdominal distention.  Endocrine: Negative.   Genitourinary: Negative.   Musculoskeletal: Negative for myalgias, joint swelling and arthralgias.  Skin: Negative for rash.  Allergic/Immunologic: Negative for environmental allergies.  Neurological: Negative for dizziness, syncope, weakness and headaches.  Hematological: Negative for adenopathy. Does not bruise/bleed easily.  Psychiatric/Behavioral: Negative for sleep disturbance and agitation. The patient is not nervous/anxious.        Objective:   Physical Exam  Constitutional: She is oriented to person, place, and time. She is active.  obese    HENT:  Head: Normocephalic and atraumatic.  Nose: No mucosal edema, rhinorrhea, sinus tenderness, nasal deformity or septal deviation. No epistaxis. Right sinus exhibits no maxillary sinus tenderness and no frontal sinus tenderness. Left sinus exhibits no maxillary sinus tenderness and no frontal sinus tenderness.  Mouth/Throat: Oropharynx is clear and moist. No oropharyngeal exudate.  Eyes: Conjunctivae and EOM are normal. Pupils are equal, round, and reactive to light. No scleral icterus.  Neck: Trachea normal and normal range of motion. Neck supple. No JVD present. No tracheal tenderness and no muscular tenderness present. Carotid bruit is not present. No rigidity. No tracheal deviation, no edema, no erythema and normal range of motion present. No thyromegaly present.  Cardiovascular: Normal rate, regular rhythm, S1 normal, S2 normal, normal heart sounds, intact distal pulses and normal pulses.  PMI is not displaced.  Exam reveals no gallop, no S3, no S4, no distant heart sounds and no friction rub.   No murmur heard.  No systolic murmur is present   No diastolic murmur is present  Pulmonary/Chest: No accessory muscle usage or stridor. No apnea and no tachypnea. No respiratory distress. She has decreased breath sounds in the right lower field and the left lower field. She has no wheezes. She has no rhonchi. She has no rales. Chest wall is not dull to percussion. She exhibits no mass, no tenderness, no bony tenderness and no deformity.  Abdominal: Soft. Normal appearance and bowel sounds are normal. She exhibits no distension and no ascites. There is no hepatosplenomegaly. There is no tenderness. There is no rigidity, no rebound and no guarding.  Musculoskeletal: Normal range of motion.  Lymphadenopathy:       Head (right side): No submental and no submandibular adenopathy present.       Head (left side): No submental and no submandibular adenopathy present.    She has no cervical adenopathy.   Neurological: She is alert and oriented to person, place, and time. She has normal strength. No sensory deficit.  Skin: Skin is warm and dry. No rash noted. She is not diaphoretic. No pallor. Nails show no clubbing.  Psychiatric: She has a normal mood and affect. Her speech is normal and behavior is normal.  Vitals reviewed.         Assessment & Plan:  I personally reviewed all images and lab data in the [facility] system as well as any outside material available during this office visit and agree with the  radiology impressions.  I also have reviewed any data /notes/records if available in care everywhere.  Obstructive chronic bronchitis without exacerbation Chronic obstructive bronchitis with associated pulmonary sarcoidosis with restrictive component on the basis of obesity No evidence of parenchymal lung disease Plan Maintain Dulera 2 puffs twice daily Maintain prednisone 5 mg daily Refills on Tussionex given Return 6 months      Leigh was seen today for follow-up.  Diagnoses and all orders for this visit:  Obstructive chronic bronchitis without exacerbation  Other orders -     chlorpheniramine-HYDROcodone (Bylas) 10-8 MG/5ML SUER; Take 5 mLs by mouth every 6 (six) hours as needed.   I

## 2015-03-03 NOTE — Assessment & Plan Note (Signed)
Chronic obstructive bronchitis with associated pulmonary sarcoidosis with restrictive component on the basis of obesity No evidence of parenchymal lung disease Plan Maintain Dulera 2 puffs twice daily Maintain prednisone 5 mg daily Refills on Tussionex given Return 6 months

## 2015-03-04 ENCOUNTER — Other Ambulatory Visit: Payer: Self-pay | Admitting: Orthopedic Surgery

## 2015-03-04 ENCOUNTER — Ambulatory Visit (HOSPITAL_COMMUNITY): Payer: BLUE CROSS/BLUE SHIELD | Attending: Cardiovascular Disease

## 2015-03-04 DIAGNOSIS — M7989 Other specified soft tissue disorders: Secondary | ICD-10-CM | POA: Insufficient documentation

## 2015-03-04 DIAGNOSIS — M79662 Pain in left lower leg: Secondary | ICD-10-CM

## 2015-03-04 DIAGNOSIS — M79605 Pain in left leg: Secondary | ICD-10-CM | POA: Diagnosis not present

## 2015-03-11 ENCOUNTER — Other Ambulatory Visit: Payer: Self-pay | Admitting: Critical Care Medicine

## 2015-03-11 ENCOUNTER — Other Ambulatory Visit: Payer: Self-pay | Admitting: Family Medicine

## 2015-03-13 ENCOUNTER — Other Ambulatory Visit: Payer: Self-pay | Admitting: Critical Care Medicine

## 2015-03-18 ENCOUNTER — Ambulatory Visit: Payer: Medicare Other | Admitting: Family Medicine

## 2015-03-19 DIAGNOSIS — L97521 Non-pressure chronic ulcer of other part of left foot limited to breakdown of skin: Secondary | ICD-10-CM | POA: Diagnosis not present

## 2015-03-19 DIAGNOSIS — Z472 Encounter for removal of internal fixation device: Secondary | ICD-10-CM | POA: Diagnosis not present

## 2015-03-27 ENCOUNTER — Inpatient Hospital Stay (HOSPITAL_COMMUNITY)
Admission: AD | Admit: 2015-03-27 | Discharge: 2015-03-29 | DRG: 863 | Disposition: A | Discharge status: Home / Self Care | Payer: BLUE CROSS/BLUE SHIELD | Source: Ambulatory Visit | Attending: Orthopedic Surgery | Admitting: Orthopedic Surgery

## 2015-03-27 ENCOUNTER — Encounter: Payer: Self-pay | Admitting: Physician Assistant

## 2015-03-27 ENCOUNTER — Other Ambulatory Visit: Payer: Self-pay | Admitting: Physician Assistant

## 2015-03-27 DIAGNOSIS — E249 Cushing's syndrome, unspecified: Secondary | ICD-10-CM

## 2015-03-27 DIAGNOSIS — Z9851 Tubal ligation status: Secondary | ICD-10-CM

## 2015-03-27 DIAGNOSIS — E1129 Type 2 diabetes mellitus with other diabetic kidney complication: Secondary | ICD-10-CM | POA: Diagnosis present

## 2015-03-27 DIAGNOSIS — Z90710 Acquired absence of both cervix and uterus: Secondary | ICD-10-CM | POA: Diagnosis not present

## 2015-03-27 DIAGNOSIS — Y838 Other surgical procedures as the cause of abnormal reaction of the patient, or of later complication, without mention of misadventure at the time of the procedure: Secondary | ICD-10-CM | POA: Diagnosis present

## 2015-03-27 DIAGNOSIS — Z7982 Long term (current) use of aspirin: Secondary | ICD-10-CM | POA: Diagnosis not present

## 2015-03-27 DIAGNOSIS — I5032 Chronic diastolic (congestive) heart failure: Secondary | ICD-10-CM | POA: Diagnosis present

## 2015-03-27 DIAGNOSIS — I10 Essential (primary) hypertension: Secondary | ICD-10-CM | POA: Diagnosis present

## 2015-03-27 DIAGNOSIS — Z22322 Carrier or suspected carrier of Methicillin resistant Staphylococcus aureus: Secondary | ICD-10-CM | POA: Insufficient documentation

## 2015-03-27 DIAGNOSIS — Z7952 Long term (current) use of systemic steroids: Secondary | ICD-10-CM | POA: Diagnosis not present

## 2015-03-27 DIAGNOSIS — Z794 Long term (current) use of insulin: Secondary | ICD-10-CM

## 2015-03-27 DIAGNOSIS — E1141 Type 2 diabetes mellitus with diabetic mononeuropathy: Secondary | ICD-10-CM

## 2015-03-27 DIAGNOSIS — E1159 Type 2 diabetes mellitus with other circulatory complications: Secondary | ICD-10-CM | POA: Diagnosis present

## 2015-03-27 DIAGNOSIS — E1149 Type 2 diabetes mellitus with other diabetic neurological complication: Secondary | ICD-10-CM | POA: Diagnosis present

## 2015-03-27 DIAGNOSIS — K219 Gastro-esophageal reflux disease without esophagitis: Secondary | ICD-10-CM | POA: Diagnosis present

## 2015-03-27 DIAGNOSIS — M109 Gout, unspecified: Secondary | ICD-10-CM | POA: Diagnosis present

## 2015-03-27 DIAGNOSIS — E785 Hyperlipidemia, unspecified: Secondary | ICD-10-CM | POA: Diagnosis present

## 2015-03-27 DIAGNOSIS — Z888 Allergy status to other drugs, medicaments and biological substances status: Secondary | ICD-10-CM | POA: Diagnosis not present

## 2015-03-27 DIAGNOSIS — Z9049 Acquired absence of other specified parts of digestive tract: Secondary | ICD-10-CM | POA: Diagnosis present

## 2015-03-27 DIAGNOSIS — D860 Sarcoidosis of lung: Secondary | ICD-10-CM | POA: Diagnosis present

## 2015-03-27 DIAGNOSIS — T847XXA Infection and inflammatory reaction due to other internal orthopedic prosthetic devices, implants and grafts, initial encounter: Secondary | ICD-10-CM | POA: Diagnosis not present

## 2015-03-27 DIAGNOSIS — G4730 Sleep apnea, unspecified: Secondary | ICD-10-CM | POA: Diagnosis present

## 2015-03-27 DIAGNOSIS — F329 Major depressive disorder, single episode, unspecified: Secondary | ICD-10-CM | POA: Diagnosis present

## 2015-03-27 DIAGNOSIS — E1165 Type 2 diabetes mellitus with hyperglycemia: Secondary | ICD-10-CM | POA: Diagnosis present

## 2015-03-27 DIAGNOSIS — T814XXA Infection following a procedure, initial encounter: Principal | ICD-10-CM | POA: Diagnosis present

## 2015-03-27 DIAGNOSIS — I110 Hypertensive heart disease with heart failure: Secondary | ICD-10-CM | POA: Diagnosis present

## 2015-03-27 DIAGNOSIS — I152 Hypertension secondary to endocrine disorders: Secondary | ICD-10-CM | POA: Diagnosis present

## 2015-03-27 DIAGNOSIS — J449 Chronic obstructive pulmonary disease, unspecified: Secondary | ICD-10-CM | POA: Diagnosis present

## 2015-03-27 DIAGNOSIS — Z881 Allergy status to other antibiotic agents status: Secondary | ICD-10-CM | POA: Diagnosis not present

## 2015-03-27 DIAGNOSIS — B9562 Methicillin resistant Staphylococcus aureus infection as the cause of diseases classified elsewhere: Secondary | ICD-10-CM | POA: Diagnosis not present

## 2015-03-27 DIAGNOSIS — E242 Drug-induced Cushing's syndrome: Secondary | ICD-10-CM | POA: Diagnosis present

## 2015-03-27 DIAGNOSIS — Z8739 Personal history of other diseases of the musculoskeletal system and connective tissue: Secondary | ICD-10-CM | POA: Diagnosis present

## 2015-03-27 DIAGNOSIS — B9689 Other specified bacterial agents as the cause of diseases classified elsewhere: Secondary | ICD-10-CM | POA: Diagnosis not present

## 2015-03-27 LAB — COMPREHENSIVE METABOLIC PANEL
ALT: 12 U/L — ABNORMAL LOW (ref 14–54)
AST: 19 U/L (ref 15–41)
Albumin: 3.6 g/dL (ref 3.5–5.0)
Alkaline Phosphatase: 84 U/L (ref 38–126)
Anion gap: 12 (ref 5–15)
BUN: 13 mg/dL (ref 6–20)
CO2: 26 mmol/L (ref 22–32)
Calcium: 8.9 mg/dL (ref 8.9–10.3)
Chloride: 98 mmol/L — ABNORMAL LOW (ref 101–111)
Creatinine, Ser: 0.9 mg/dL (ref 0.44–1.00)
GFR calc Af Amer: >60 mL/min (ref >60)
GFR calc non Af Amer: >60 mL/min (ref >60)
Glucose, Bld: 120 mg/dL — ABNORMAL HIGH (ref 65–99)
Potassium: 3.8 mmol/L (ref 3.5–5.1)
Sodium: 136 mmol/L (ref 135–145)
Total Bilirubin: 0.5 mg/dL (ref 0.3–1.2)
Total Protein: 6.4 g/dL — ABNORMAL LOW (ref 6.5–8.1)

## 2015-03-27 LAB — CBC WITH DIFFERENTIAL/PLATELET
Basophils Absolute: 0 10*3/uL (ref 0.0–0.1)
Basophils Relative: 0 % (ref 0–1)
Eosinophils Absolute: 0.4 10*3/uL (ref 0.0–0.7)
Eosinophils Relative: 4 % (ref 0–5)
HCT: 32.4 % — ABNORMAL LOW (ref 36.0–46.0)
Hemoglobin: 10.4 g/dL — ABNORMAL LOW (ref 12.0–15.0)
Lymphocytes Relative: 25 % (ref 12–46)
Lymphs Abs: 3 10*3/uL (ref 0.7–4.0)
MCH: 28.5 pg (ref 26.0–34.0)
MCHC: 32.1 g/dL (ref 30.0–36.0)
MCV: 88.8 fL (ref 78.0–100.0)
Monocytes Absolute: 0.8 10*3/uL (ref 0.1–1.0)
Monocytes Relative: 7 % (ref 3–12)
Neutro Abs: 7.8 10*3/uL — ABNORMAL HIGH (ref 1.7–7.7)
Neutrophils Relative %: 64 % (ref 43–77)
Platelets: 374 10*3/uL (ref 150–400)
RBC: 3.65 MIL/uL — ABNORMAL LOW (ref 3.87–5.11)
RDW: 14.7 % (ref 11.5–15.5)
WBC: 12 10*3/uL — ABNORMAL HIGH (ref 4.0–10.5)

## 2015-03-27 LAB — GLUCOSE, CAPILLARY: Glucose-Capillary: 189 mg/dL — ABNORMAL HIGH (ref 65–99)

## 2015-03-27 MED ORDER — GABAPENTIN 100 MG PO CAPS
100.0000 mg | ORAL_CAPSULE | Freq: Three times a day (TID) | ORAL | Status: DC
  Administered 2015-03-28 – 2015-03-29 (×5): 100 mg via ORAL
  Filled 2015-03-27 (×5): qty 1

## 2015-03-27 MED ORDER — HYDROCODONE-ACETAMINOPHEN 10-325 MG PO TABS
1.0000 | ORAL_TABLET | ORAL | Status: DC | PRN
  Administered 2015-03-28: 2 via ORAL
  Filled 2015-03-27: qty 2

## 2015-03-27 MED ORDER — ALBUTEROL SULFATE HFA 108 (90 BASE) MCG/ACT IN AERS: 1.0000 | INHALATION_SPRAY | Freq: Four times a day (QID) | RESPIRATORY_TRACT | Status: DC | PRN

## 2015-03-27 MED ORDER — LOSARTAN POTASSIUM 50 MG PO TABS
100.0000 mg | ORAL_TABLET | Freq: Every day | ORAL | Status: DC
  Administered 2015-03-28 – 2015-03-29 (×2): 100 mg via ORAL
  Filled 2015-03-27 (×2): qty 2

## 2015-03-27 MED ORDER — ESTRADIOL 2 MG PO TABS
2.0000 mg | ORAL_TABLET | Freq: Every day | ORAL | Status: DC
  Administered 2015-03-28 (×2): 2 mg via ORAL
  Filled 2015-03-27 (×3): qty 1

## 2015-03-27 MED ORDER — PREDNISONE 5 MG PO TABS
5.0000 mg | ORAL_TABLET | Freq: Every day | ORAL | Status: DC
  Administered 2015-03-28 – 2015-03-29 (×2): 5 mg via ORAL
  Filled 2015-03-27 (×2): qty 1

## 2015-03-27 MED ORDER — POTASSIUM CHLORIDE IN NACL 20-0.9 MEQ/L-% IV SOLN
INTRAVENOUS | Status: DC
  Administered 2015-03-27: 19:00:00 via INTRAVENOUS
  Filled 2015-03-27: qty 1000

## 2015-03-27 MED ORDER — LOSARTAN POTASSIUM 50 MG PO TABS: 100.0000 mg | ORAL_TABLET | Freq: Every day | ORAL | Status: DC

## 2015-03-27 MED ORDER — INSULIN ASPART 100 UNIT/ML ~~LOC~~ SOLN
0.0000 [IU] | Freq: Three times a day (TID) | SUBCUTANEOUS | Status: DC
  Administered 2015-03-28 – 2015-03-29 (×3): 3 [IU] via SUBCUTANEOUS

## 2015-03-27 MED ORDER — POTASSIUM CHLORIDE CRYS ER 20 MEQ PO TBCR
20.0000 meq | EXTENDED_RELEASE_TABLET | Freq: Three times a day (TID) | ORAL | Status: DC
  Administered 2015-03-28 – 2015-03-29 (×5): 20 meq via ORAL
  Filled 2015-03-27 (×6): qty 1

## 2015-03-27 MED ORDER — ACETAMINOPHEN 500 MG PO TABS
500.0000 mg | ORAL_TABLET | ORAL | Status: DC | PRN
  Administered 2015-03-27 – 2015-03-28 (×3): 500 mg via ORAL
  Filled 2015-03-27 (×3): qty 1

## 2015-03-27 MED ORDER — FENOFIBRATE 145 MG PO TABS
145.0000 mg | ORAL_TABLET | Freq: Every day | ORAL | Status: DC
  Administered 2015-03-28 – 2015-03-29 (×2): 145 mg via ORAL
  Filled 2015-03-27 (×2): qty 1

## 2015-03-27 MED ORDER — INSULIN GLARGINE 300 UNIT/ML ~~LOC~~ SOPN: 40.0000 [IU] | PEN_INJECTOR | Freq: Every day | SUBCUTANEOUS | Status: DC

## 2015-03-27 MED ORDER — FLUTICASONE PROPIONATE 50 MCG/ACT NA SUSP
2.0000 | Freq: Every day | NASAL | Status: DC
  Administered 2015-03-28 – 2015-03-29 (×2): 2 via NASAL
  Filled 2015-03-27: qty 16

## 2015-03-27 MED ORDER — NITROGLYCERIN 0.4 MG SL SUBL: 0.4000 mg | SUBLINGUAL_TABLET | SUBLINGUAL | Status: DC | PRN

## 2015-03-27 MED ORDER — ALLOPURINOL 100 MG PO TABS
100.0000 mg | ORAL_TABLET | Freq: Every day | ORAL | Status: DC
  Administered 2015-03-28 – 2015-03-29 (×2): 100 mg via ORAL
  Filled 2015-03-27 (×2): qty 1

## 2015-03-27 MED ORDER — RIFAMPIN 300 MG PO CAPS
300.0000 mg | ORAL_CAPSULE | Freq: Two times a day (BID) | ORAL | Status: DC
  Administered 2015-03-28 – 2015-03-29 (×4): 300 mg via ORAL
  Filled 2015-03-27 (×4): qty 1

## 2015-03-27 MED ORDER — VENLAFAXINE HCL ER 75 MG PO CP24: 75.0000 mg | ORAL_CAPSULE | Freq: Two times a day (BID) | ORAL | Status: DC

## 2015-03-27 MED ORDER — BENZONATATE 100 MG PO CAPS: 200.0000 mg | ORAL_CAPSULE | Freq: Three times a day (TID) | ORAL | Status: DC | PRN

## 2015-03-27 MED ORDER — DILTIAZEM HCL ER COATED BEADS 120 MG PO CP24: 120.0000 mg | ORAL_CAPSULE | Freq: Every day | ORAL | Status: DC

## 2015-03-27 MED ORDER — ALLOPURINOL 100 MG PO TABS: 100.0000 mg | ORAL_TABLET | Freq: Every day | ORAL | Status: DC

## 2015-03-27 MED ORDER — ASPIRIN EC 81 MG PO TBEC
81.0000 mg | DELAYED_RELEASE_TABLET | Freq: Every day | ORAL | Status: DC
  Administered 2015-03-28 – 2015-03-29 (×2): 81 mg via ORAL
  Filled 2015-03-27 (×2): qty 1

## 2015-03-27 MED ORDER — HYDROCODONE-ACETAMINOPHEN 10-325 MG PO TABS: 1.0000 | ORAL_TABLET | ORAL | Status: DC | PRN

## 2015-03-27 MED ORDER — POTASSIUM CHLORIDE CRYS ER 20 MEQ PO TBCR: 20.0000 meq | EXTENDED_RELEASE_TABLET | Freq: Three times a day (TID) | ORAL | Status: DC

## 2015-03-27 MED ORDER — INSULIN GLARGINE 100 UNIT/ML ~~LOC~~ SOLN
40.0000 [IU] | Freq: Every day | SUBCUTANEOUS | Status: DC
  Administered 2015-03-28 (×2): 40 [IU] via SUBCUTANEOUS
  Filled 2015-03-27 (×3): qty 0.4

## 2015-03-27 MED ORDER — MOMETASONE FURO-FORMOTEROL FUM 200-5 MCG/ACT IN AERO
2.0000 | INHALATION_SPRAY | Freq: Two times a day (BID) | RESPIRATORY_TRACT | Status: DC
  Administered 2015-03-28 – 2015-03-29 (×3): 2 via RESPIRATORY_TRACT
  Filled 2015-03-27: qty 8.8

## 2015-03-27 MED ORDER — ONDANSETRON HCL 4 MG PO TABS: 4.0000 mg | ORAL_TABLET | Freq: Four times a day (QID) | ORAL | Status: DC | PRN

## 2015-03-27 MED ORDER — ATORVASTATIN CALCIUM 40 MG PO TABS: 40.0000 mg | ORAL_TABLET | Freq: Every day | ORAL | Status: DC

## 2015-03-27 MED ORDER — ENOXAPARIN SODIUM 40 MG/0.4ML ~~LOC~~ SOLN
40.0000 mg | SUBCUTANEOUS | Status: DC
  Administered 2015-03-27: 40 mg via SUBCUTANEOUS
  Filled 2015-03-27: qty 0.4

## 2015-03-27 MED ORDER — ALBUTEROL SULFATE (2.5 MG/3ML) 0.083% IN NEBU: 2.5000 mg | INHALATION_SOLUTION | Freq: Four times a day (QID) | RESPIRATORY_TRACT | Status: DC | PRN

## 2015-03-27 MED ORDER — CALCIUM CITRATE 950 (200 CA) MG PO TABS
1200.0000 mg | ORAL_TABLET | Freq: Every day | ORAL | Status: DC
  Administered 2015-03-28 – 2015-03-29 (×2): 1200 mg via ORAL
  Filled 2015-03-27 (×2): qty 6

## 2015-03-27 MED ORDER — BUPROPION HCL ER (XL) 150 MG PO TB24
300.0000 mg | ORAL_TABLET | Freq: Every day | ORAL | Status: DC
  Administered 2015-03-28 – 2015-03-29 (×2): 300 mg via ORAL
  Filled 2015-03-27 (×2): qty 2

## 2015-03-27 MED ORDER — DILTIAZEM HCL ER COATED BEADS 120 MG PO CP24
120.0000 mg | ORAL_CAPSULE | Freq: Every day | ORAL | Status: DC
  Administered 2015-03-28 – 2015-03-29 (×2): 120 mg via ORAL
  Filled 2015-03-27 (×2): qty 1

## 2015-03-27 MED ORDER — PROMETHAZINE HCL 25 MG/ML IJ SOLN
12.5000 mg | Freq: Four times a day (QID) | INTRAMUSCULAR | Status: DC | PRN
  Administered 2015-03-28: 25 mg via INTRAVENOUS
  Filled 2015-03-27: qty 1

## 2015-03-27 MED ORDER — VANCOMYCIN HCL 10 G IV SOLR
2000.0000 mg | Freq: Once | INTRAVENOUS | Status: AC
  Administered 2015-03-27: 2000 mg via INTRAVENOUS
  Filled 2015-03-27 (×3): qty 2000

## 2015-03-27 MED ORDER — ATORVASTATIN CALCIUM 40 MG PO TABS
40.0000 mg | ORAL_TABLET | Freq: Every day | ORAL | Status: DC
  Administered 2015-03-28: 40 mg via ORAL
  Filled 2015-03-27: qty 1

## 2015-03-27 MED ORDER — CARVEDILOL 25 MG PO TABS
25.0000 mg | ORAL_TABLET | Freq: Two times a day (BID) | ORAL | Status: DC
  Administered 2015-03-28 – 2015-03-29 (×3): 25 mg via ORAL
  Filled 2015-03-27 (×3): qty 1

## 2015-03-27 MED ORDER — FUROSEMIDE 40 MG PO TABS
40.0000 mg | ORAL_TABLET | Freq: Two times a day (BID) | ORAL | Status: DC
  Administered 2015-03-28 – 2015-03-29 (×3): 40 mg via ORAL
  Filled 2015-03-27 (×3): qty 1

## 2015-03-27 MED ORDER — PROMETHAZINE HCL 25 MG PO TABS: 12.5000 mg | ORAL_TABLET | Freq: Four times a day (QID) | ORAL | Status: DC | PRN

## 2015-03-27 MED ORDER — VENLAFAXINE HCL ER 75 MG PO CP24
75.0000 mg | ORAL_CAPSULE | Freq: Two times a day (BID) | ORAL | Status: DC
  Administered 2015-03-28 – 2015-03-29 (×4): 75 mg via ORAL
  Filled 2015-03-27 (×4): qty 1

## 2015-03-27 MED ORDER — VITAMIN D 1000 UNITS PO TABS
5000.0000 [IU] | ORAL_TABLET | Freq: Every day | ORAL | Status: DC
  Administered 2015-03-28: 5000 [IU] via ORAL
  Filled 2015-03-27: qty 5

## 2015-03-27 MED ORDER — FLUTICASONE PROPIONATE 50 MCG/ACT NA SUSP: 2.0000 | Freq: Every day | NASAL | Status: DC

## 2015-03-27 MED ORDER — PREDNISONE 5 MG PO TABS: 5.0000 mg | ORAL_TABLET | Freq: Every day | ORAL | Status: DC

## 2015-03-27 MED ORDER — ONDANSETRON HCL 4 MG/2ML IJ SOLN
4.0000 mg | Freq: Four times a day (QID) | INTRAMUSCULAR | Status: DC | PRN
  Administered 2015-03-28: 4 mg via INTRAVENOUS
  Filled 2015-03-27: qty 2

## 2015-03-27 MED ORDER — GABAPENTIN 100 MG PO CAPS: 100.0000 mg | ORAL_CAPSULE | Freq: Three times a day (TID) | ORAL | Status: DC

## 2015-03-27 MED ORDER — FUROSEMIDE 40 MG PO TABS: 40.0000 mg | ORAL_TABLET | Freq: Two times a day (BID) | ORAL | Status: DC

## 2015-03-27 MED ORDER — MOMETASONE FURO-FORMOTEROL FUM 200-5 MCG/ACT IN AERO: 2.0000 | INHALATION_SPRAY | Freq: Two times a day (BID) | RESPIRATORY_TRACT | Status: DC

## 2015-03-27 NOTE — Progress Notes (Signed)
ANTIBIOTIC CONSULT NOTE - INITIAL  Pharmacy Consult for vancomycin Indication: osteomyelitis  Allergies  Allergen Reactions  . Lisinopril Cough  . Methotrexate     REACTION: peri-oral and buccal lesions.  . Clindamycin/Lincomycin Nausea And Vomiting and Rash    Patient Measurements: Height: 5' 6"  (167.6 cm) Weight: 278 lb 3.5 oz (126.2 kg) IBW/kg (Calculated) : 59.3  Vital Signs: Temp: 97.8 F (36.6 C) (06/16 2126) BP: 138/57 mmHg (06/16 2126) Pulse Rate: 79 (06/16 2126)  Labs:  Recent Labs  03/27/15 1802  WBC 12.0*  HGB 10.4*  PLT 374  CREATININE 0.90   Estimated Creatinine Clearance: 96 mL/min (by C-G formula based on Cr of 0.9).  Medical History: Past Medical History  Diagnosis Date  . DIABETES MELLITUS, TYPE II 08/21/2006  . HYPERLIPIDEMIA 08/21/2006  . GOUT 08/20/2010  . OBESITY 06/04/2009  . ANEMIA-UNSPECIFIED 09/18/2009  . HYPERTENSION 08/21/2006  . GERD 08/21/2006  . SLEEP APNEA 04/21/2009    last testing was negative  . Internal hemorrhoids   . Pulmonary sarcoidosis     Followed locally by pulmonology, but also by Dr. Casper Harrison at [facility]  . Exertional chest pain     sharp, substernal, exertional  . Vocal cord dysfunction   . Chronic diastolic heart failure, NYHA class 2     LVEDP roughly 20% by cath  . CHF (congestive heart failure)   . COPD (chronic obstructive pulmonary disease)   . Depression   . Fracture of 5th metatarsal     non union  . Abnormal SPEP 04/17/2014    Medications:  Prescriptions prior to admission  Medication Sig Dispense Refill Last Dose  . allopurinol (ZYLOPRIM) 100 MG tablet Take 100 mg by mouth daily.    03/26/2015 at Unknown time  . aspirin EC 81 MG tablet Take 81 mg by mouth daily.    03/26/2015 at Unknown time  . atorvastatin (LIPITOR) 40 MG tablet Take one tablet by mouth one time daily (Patient taking differently: Take one tablet by mouth every evening.) 90 tablet 3 03/26/2015 at Unknown time  . buPROPion  (WELLBUTRIN XL) 300 MG 24 hr tablet Take 1 tablet (300 mg total) by mouth daily. 30 tablet 10 03/26/2015 at Unknown time  . CARTIA XT 120 MG 24 hr capsule TAKE ONE CAPSULE BY MOUTH ONE TIME DAILY IN THE EVENING  30 capsule 11 03/26/2015 at Unknown time  . carvedilol (COREG) 25 MG tablet TAKE ONE TABLET BY MOUTH TWICE DAILY with a meal. 180 tablet 1 03/26/2015 at 2230  . cholecalciferol 5000 UNITS TABS Take 1 tablet (5,000 Units total) by mouth daily. 30 tablet 3 03/26/2015 at Unknown time  . DEXILANT 60 MG capsule TAKE ONE CAPSULE BY MOUTH ONE TIME DAILY 30 capsule 6 03/26/2015 at Unknown time  . estradiol (ESTRACE) 2 MG tablet Take 2 mg by mouth at bedtime.    03/26/2015 at Unknown time  . fenofibrate (TRICOR) 145 MG tablet TAKE ONE TABLET BY MOUTH ONE TIME DAILY  30 tablet 10 03/26/2015 at Unknown time  . fluticasone (FLONASE) 50 MCG/ACT nasal spray USE TWO SPRAYS IN EACH NOSTRIL DAILY 16 g 4 03/26/2015 at Unknown time  . furosemide (LASIX) 40 MG tablet Take one tablet by mouth twice daily   03/26/2015 at Unknown time  . gabapentin (NEURONTIN) 100 MG capsule TAKE ONE CAPSULE BY MOUTH THREE TIMES DAILY 90 capsule 3 03/26/2015 at Unknown time  . insulin aspart (NOVOLOG) 100 UNIT/ML injection Inject 10-18 Units into the skin 3 (three)  times daily before meals.   03/26/2015 at Unknown time  . Insulin Glargine (TOUJEO SOLOSTAR) 300 UNIT/ML SOPN Inject 40 Units into the skin at bedtime. 3 pen 2 03/26/2015 at Unknown time  . losartan (COZAAR) 100 MG tablet TAKE ONE TABLET BY MOUTH ONE TIME DAILY  90 tablet 1 03/26/2015 at Unknown time  . metFORMIN (GLUCOPHAGE) 1000 MG tablet TAKE ONE TABLET BY MOUTH TWICE DAILY with a meal. 180 tablet 2 03/26/2015 at Unknown time  . mometasone-formoterol (DULERA) 200-5 MCG/ACT AERO Inhale 2 puffs into the lungs 2 (two) times daily. 1 Inhaler 6 03/26/2015 at Unknown time  . mupirocin ointment (BACTROBAN) 2 % Apply 1 application topically daily.   0 Past Week at Unknown time  . PARoxetine  Mesylate (BRISDELLE) 7.5 MG CAPS Take 7.5 mg by mouth daily.   03/26/2015 at Unknown time  . potassium chloride SA (K-DUR,KLOR-CON) 20 MEQ tablet TAKE ONE TABLET BY MOUTH THREE TIMES DAILY 90 tablet 1 03/26/2015 at Unknown time  . predniSONE (DELTASONE) 5 MG tablet Take 1 tablet (5 mg total) by mouth daily with breakfast. Sig 40ms daily   03/26/2015 at Unknown time  . sulfamethoxazole-trimethoprim (BACTRIM DS,SEPTRA DS) 800-160 MG per tablet Take 2 tablets by mouth 2 (two) times daily.   0 03/26/2015 at Unknown time  . venlafaxine XR (EFFEXOR-XR) 75 MG 24 hr capsule Take 1 capsule (75 mg total) by mouth 2 (two) times daily. 60 capsule 11 03/26/2015 at Unknown time  . [DISCONTINUED] Calcium Citrate 200 MG TABS Take 1,200 mg by mouth daily.    03/26/2015 at Unknown time  . albuterol (PROVENTIL HFA;VENTOLIN HFA) 108 (90 BASE) MCG/ACT inhaler Inhale 1-2 puffs into the lungs every 6 (six) hours as needed for wheezing or shortness of breath. For wheezing.   5 months  . chlorpheniramine-HYDROcodone (TUSSIONEX) 10-8 MG/5ML SUER Take 5 mLs by mouth every 6 (six) hours as needed. (Patient taking differently: Take 5 mLs by mouth every 6 (six) hours as needed for cough. ) 140 mL 0 2 months  . glucose blood (ONE TOUCH ULTRA TEST) test strip 1 each by Other route 3 (three) times daily. Use as instructed 100 each 5 Taking  . HYDROcodone-acetaminophen (NORCO) 10-325 MG per tablet Take 1-2 tablets by mouth every 4 (four) hours as needed for moderate pain.   0 03/24/2015  . nitroGLYCERIN (NITROSTAT) 0.4 MG SL tablet Place 0.4 mg under the tongue every 5 (five) minutes as needed. x3 doses as needed for chest pain.   3 months  . [DISCONTINUED] albuterol (PROVENTIL) (5 MG/ML) 0.5% nebulizer solution Take 2.5 mg by nebulization every 6 (six) hours as needed. For wheezing.   1 year  . [DISCONTINUED] B-D ULTRAFINE III SHORT PEN 31G X 8 MM MISC Follow directions provided by physician 30 each 2 Taking  . [DISCONTINUED] benzonatate  (TESSALON) 200 MG capsule Take 200 mg by mouth as needed for cough.    4 months  . [DISCONTINUED] Blood Glucose Monitoring Suppl (FREESTYLE FREEDOM LITE) W/DEVICE KIT Use as adviced 1 each 0 Taking   Scheduled:  . [START ON 03/28/2015] allopurinol  100 mg Oral Daily  . [START ON 03/28/2015] aspirin EC  81 mg Oral Daily  . [START ON 03/28/2015] atorvastatin  40 mg Oral q1800  . [START ON 03/28/2015] buPROPion  300 mg Oral Daily  . [START ON 03/28/2015] calcium citrate  1,200 mg of elemental calcium Oral Daily  . [START ON 03/28/2015] carvedilol  25 mg Oral BID WC  . [START  ON 03/28/2015] cholecalciferol  5,000 Units Oral Daily  . [START ON 03/28/2015] diltiazem  120 mg Oral Daily  . enoxaparin (LOVENOX) injection  40 mg Subcutaneous Q24H  . [START ON 03/28/2015] estradiol  2 mg Oral QHS  . [START ON 03/28/2015] fenofibrate  145 mg Oral Daily  . [START ON 03/28/2015] fluticasone  2 spray Each Nare Daily  . [START ON 03/28/2015] furosemide  40 mg Oral BID  . gabapentin  100 mg Oral TID  . [START ON 03/28/2015] insulin aspart  0-20 Units Subcutaneous TID WC  . [START ON 03/28/2015] insulin glargine  40 Units Subcutaneous QHS  . [START ON 03/28/2015] losartan  100 mg Oral Daily  . [START ON 03/28/2015] mometasone-formoterol  2 puff Inhalation BID  . [START ON 03/28/2015] potassium chloride SA  20 mEq Oral TID  . [START ON 03/28/2015] predniSONE  5 mg Oral Q breakfast  . [START ON 03/28/2015] rifampin  300 mg Oral Q12H  . [START ON 03/28/2015] venlafaxine XR  75 mg Oral BID    Assessment: 54yo female admitted for persistent foot infection w/ hardware, has been on Septra but continues w/ purulent discharge, to begin IV ABX for osteo.  Goal of Therapy:  Vancomycin trough level 15-20 mcg/ml  Plan:  Rec'd vanc 2g; will continue with vancomycin 1259m IV Q12H and monitor CBC, Cx, levels prn.  VWynona Neat PharmD, BCPS  03/27/2015,11:57 PM

## 2015-03-27 NOTE — Consult Note (Signed)
PCP:   Garret Reddish, MD   Consult md dr Noemi Chapel  Reason for consult:  Help with medical management of chronic diseases  Cc drain from foot HPI: 54 yo female with h/o steroid dependent sarcoidosis, diastolic chf, obesity, iddm, htn admitted for persistent foot infection with hardware ([facility] with recent removal of some of the metal in her heal) has been on septra still with purulent drainage.  ID is following patient for her infectious issues.  We were called to help manage her other medical issues while in the hospital.  Pt reports that she has not had a flare in well over a year from her sarcoidosis and doing very well on prednisone 71m daily.  Also her heart failure has been very stable for over a year as well.  Denies any sob, no swelling in her legs except what is associated with her infection in her left foot.  Pain well controlled.  Glucose has been pretty good per her report.  She has not complaints.  Review of Systems:  Positive and negative as per HPI otherwise all other systems are negative  Past Medical History: Past Medical History  Diagnosis Date  . DIABETES MELLITUS, TYPE II 08/21/2006  . HYPERLIPIDEMIA 08/21/2006  . GOUT 08/20/2010  . OBESITY 06/04/2009  . ANEMIA-UNSPECIFIED 09/18/2009  . HYPERTENSION 08/21/2006  . GERD 08/21/2006  . SLEEP APNEA 04/21/2009    last testing was negative  . Internal hemorrhoids   . Pulmonary sarcoidosis     Followed locally by pulmonology, but [facility]  . Exertional chest pain     sharp, substernal, exertional  . Vocal cord dysfunction   . Chronic diastolic heart failure, NYHA class 2     LVEDP roughly 20% by cath  . CHF (congestive heart failure)   . COPD (chronic obstructive pulmonary disease)   . Depression   . Fracture of 5th metatarsal     non union  . Abnormal SPEP 04/17/2014   Past Surgical History  Procedure Laterality Date  . Ventral hernia repair    . Nissen  fundoplication  27035 . Cholecystectomy  1984  . Abdominal hysterectomy    . Knee arthroscopy      right  . Tubal ligation      with reversal in 1994  . Bladder suspension  11/11/2011    Procedure: TRANSVAGINAL TAPE (TVT) PROCEDURE;  Surgeon: MOlga Millers MD;  [place] [facility];  Service: Gynecology;  Laterality: N/A;  . Cystoscopy  11/11/2011    Procedure: CYSTOSCOPY;  Surgeon: MOlga Millers MD;  [place] [facility];  Service: Gynecology;  Laterality: N/A;  . Doppler echocardiography  02/12/2013    LV FUNCTION, SIZE NORMAL; MILD CONCENTRIC LVH; EST EF 55-65%; WALL MOTION NORMAL  . Lexiscan myoview  03/09/2013    EF 50%; NORMAL MYOCARDIAL PERFUSION STUDY - breast attenuation  . Cardiac catheterization  07/2010    LVEF 50-55% WITH VERY MILD GLOBAL HYPOKINESIA; ESSENTIALLY NORMAL CORONARY ARTERIES; NORMAL LV FUNCTION  . Carotids  02/18/11    CAROTID DUPLEX; VERTEBRALS ARE PATENT WITH ANTEGRADE FLOW. ICA/CCA RATIO 1.61 ON RIGHT AND 0.75 ON LEFT  . Right and left cardiac catheterization  04/23/2013    Angiographic normal coronaries; LVEDP 20 mmHg, PCWP 12-14 mmHg, RAP 12 mmHg.; Fick CO/CI 4.9/2.2  . Metatarsal osteotomy with open reduction internal fixation (orif) metatarsal with fusion Left 04/09/2014    Procedure: LEFT FOOT FRACTURE OPEN TREATMENT METATARSAL INCLUDES INTERNAL FIXATION  EACH;  Surgeon: Lorn Junes, MD;  Location: [place];  Service: Orthopedics;  Laterality: Left;  . Left and right heart catheterization with coronary angiogram N/A 04/23/2013    Procedure: LEFT AND RIGHT HEART CATHETERIZATION WITH CORONARY ANGIOGRAM;  Surgeon: Leonie Man, MD;  Location: [facility] C[facility] LAB;  Service: Cardiovascular;  Laterality: N/A;    Medications: Prior to Admission medications   Medication Sig Start Date End Date Taking? Authorizing Provider  allopurinol (ZYLOPRIM) 100 MG tablet Take 100 mg by mouth daily.    Yes Historical Provider, MD  aspirin EC 81 MG tablet Take 81 mg by  mouth daily.    Yes Historical Provider, MD  atorvastatin (LIPITOR) 40 MG tablet Take one tablet by mouth one time daily Patient taking differently: Take one tablet by mouth every evening.   Yes Lisabeth Pick, MD  buPROPion (WELLBUTRIN XL) 300 MG 24 hr tablet Take 1 tablet (300 mg total) by mouth daily. 09/18/14  Yes Samella Parr, NP  Calcium Citrate 200 MG TABS Take 1,200 mg by mouth daily.    Yes Historical Provider, MD  CARTIA XT 120 MG 24 hr capsule TAKE ONE CAPSULE BY MOUTH ONE TIME DAILY IN THE EVENING  10/03/14  Yes Leonie Man, MD  carvedilol (COREG) 25 MG tablet TAKE ONE TABLET BY MOUTH TWICE DAILY with a meal. 12/20/14  Yes Leonie Man, MD  cholecalciferol 5000 UNITS TABS Take 1 tablet (5,000 Units total) by mouth daily. 04/09/14  Yes Kirstin Shepperson, PA-C  DEXILANT 60 MG capsule TAKE ONE CAPSULE BY MOUTH ONE TIME DAILY 03/14/15  Yes Elsie Stain, MD  estradiol (ESTRACE) 2 MG tablet Take 2 mg by mouth at bedtime.  01/24/14  Yes Historical Provider, MD  fenofibrate (TRICOR) 145 MG tablet TAKE ONE TABLET BY MOUTH ONE TIME DAILY  11/05/14  Yes Leonie Man, MD  fluticasone [facility]) 50 MCG/ACT nasal spray USE TWO SPRAYS IN [facility] NOSTRIL DAILY 02/03/15  Yes [name], MD  furosemide (LASIX) 40 MG tablet Take one tablet by mouth twice daily   Yes Leonie Man, MD  gabapentin (NEURONTIN) 100 MG capsule TAKE ONE CAPSULE BY MOUTH THREE TIMES DAILY 02/14/15  Yes Philemon Kingdom, MD  insulin aspart (NOVOLOG) 100 UNIT/ML injection Inject 10-18 Units into the skin 3 (three) times daily before meals.   Yes Historical Provider, MD  Insulin Glargine (TOUJEO SOLOSTAR) 300 UNIT/ML SOPN Inject 40 Units into the skin at bedtime. 10/24/14  Yes Philemon Kingdom, MD  losartan (COZAAR) 100 MG tablet TAKE ONE TABLET BY MOUTH ONE TIME DAILY  09/19/14  Yes Leonie Man, MD  metFORMIN (GLUCOPHAGE) 1000 MG tablet TAKE ONE TABLET BY MOUTH TWICE DAILY with a meal. 12/29/14  Yes Philemon Kingdom,  MD  mometasone-formoterol (DULERA) 200-5 MCG/ACT AERO Inhale 2 puffs into the lungs 2 (two) times daily. 06/15/13  Yes Elsie Stain, MD  mupirocin ointment (BACTROBAN) 2 % Apply 1 application topically daily.  03/09/15  Yes Historical Provider, MD  PARoxetine Mesylate (BRISDELLE) 7.5 MG CAPS Take 7.5 mg by mouth daily.   Yes Historical Provider, MD  potassium chloride SA (K-DUR,KLOR-CON) 20 MEQ tablet TAKE ONE TABLET BY MOUTH THREE TIMES DAILY 03/12/15  Yes [name], MD  predniSONE (DELTASONE) 5 MG tablet Take 1 tablet (5 mg total) by mouth daily with breakfast. Sig 81ms daily 09/27/14  Yes ASamella Parr NP  sulfamethoxazole-trimethoprim (BACTRIM DS,SEPTRA DS) 800-160 MG per tablet Take 2 tablets  by mouth 2 (two) times daily.  03/13/15  Yes Historical Provider, MD  venlafaxine XR (EFFEXOR-XR) 75 MG 24 hr capsule Take 1 capsule (75 mg total) by mouth 2 (two) times daily. 09/18/14  Yes Samella Parr, NP  albuterol (PROVENTIL HFA;VENTOLIN HFA) 108 (90 BASE) MCG/ACT inhaler Inhale 1-2 puffs into the lungs every 6 (six) hours as needed for wheezing or shortness of breath. For wheezing.    Historical Provider, MD  albuterol (PROVENTIL) (5 MG/ML) 0.5% nebulizer solution Take 2.5 mg by nebulization every 6 (six) hours as needed. For wheezing.    Historical Provider, MD  B-D ULTRAFINE III SHORT PEN 31G X 8 MM MISC Follow directions provided by physician 09/13/13   Lisabeth Pick, MD  benzonatate (TESSALON) 200 MG capsule Take 200 mg by mouth as needed for cough.  09/19/13   Historical Provider, MD  Blood Glucose Monitoring Suppl (FREESTYLE FREEDOM LITE) W/DEVICE KIT Use as adviced 12/27/13   Philemon Kingdom, MD  chlorpheniramine-HYDROcodone (TUSSIONEX) 10-8 MG/5ML SUER Take 5 mLs by mouth every 6 (six) hours as needed. Patient taking differently: Take 5 mLs by mouth every 6 (six) hours as needed for cough.  03/03/15   Elsie Stain, MD  glucose blood (ONE TOUCH ULTRA TEST) test strip 1 each by Other  route 3 (three) times daily. Use as instructed 10/19/13   Lisabeth Pick, MD  HYDROcodone-acetaminophen (NORCO) 10-325 MG per tablet Take 1-2 tablets by mouth every 4 (four) hours as needed for moderate pain.  03/20/15   Historical Provider, MD  nitroGLYCERIN (NITROSTAT) 0.4 MG SL tablet Place 0.4 mg under the tongue every 5 (five) minutes as needed. x3 doses as needed for chest pain.    Historical Provider, MD    Allergies:   Allergies  Allergen Reactions  . Lisinopril Cough  . Methotrexate     REACTION: peri-oral and buccal lesions.  . Clindamycin/Lincomycin Nausea And Vomiting and Rash    Social History:  reports that she has never smoked. She has never used smokeless tobacco. She reports that she does not drink alcohol or use illicit drugs.  Family History: Family History  Problem Relation Age of Onset  . Diabetes Father   . Heart attack Father   . Coronary artery disease Father   . Heart failure Father   . COPD Mother   . Emphysema Mother   . Asthma Mother   . Heart failure Mother   . Sarcoidosis Maternal Uncle   . Colon cancer Neg Hx   . Lung cancer Brother   . Cancer Brother   . Diabetes Brother   . Heart attack Maternal Grandfather     Physical Exam: Filed Vitals:   03/27/15 1713 03/27/15 2126  BP: 146/56 138/57  Pulse: 84 79  Temp: 98.6 F (37 C) 97.8 F (36.6 C)  Resp: 18 16  SpO2: 98% 97%   General appearance: alert, cooperative and no distress Head: Normocephalic, without obvious abnormality, atraumatic Eyes: negative Nose: Nares normal. Septum midline. Mucosa normal. No drainage or sinus tenderness. Neck: no JVD and supple, symmetrical, trachea midline Lungs: clear to auscultation bilaterally Heart: regular rate and rhythm, S1, S2 normal, no murmur, click, rub or gallop Abdomen: soft, non-tender; bowel sounds normal; no masses,  no organomegaly Extremities: extremities normal, atraumatic, no cyanosis or edema  Left foot wrapped Pulses: 2+ and  symmetric Skin: Skin color, texture, turgor normal. No rashes or lesions Neurologic: Grossly normal    Labs on Admission:   Recent Labs  03/27/15 1802  NA 136  K 3.8  CL 98*  CO2 26  GLUCOSE 120*  BUN 13  CREATININE 0.90  CALCIUM 8.9    Recent Labs  03/27/15 1802  AST 19  ALT 12*  ALKPHOS 84  BILITOT 0.5  PROT 6.4*  ALBUMIN 3.6    Recent Labs  03/27/15 1802  WBC 12.0*  NEUTROABS 7.8*  HGB 10.4*  HCT 32.4*  MCV 88.8  PLT 374     Old records reviewed  Case discussed with dr Penelope Coop extender kirsten  Radiological Exams on Admission: No results found.  Assessment/Plan  54 yo female with recent foot infection also with h/o chf, sarcoidosis, iddm, htn, obesity  Principal Problem:   Chronic diastolic heart failure, NYHA class 2- euvolemic.  Stop ivf, no need for ivf at this time.  Resume lasix 40gm bid orally home dose.  Daily wts and accurate i/os ordered.  Monitor closely for any fluid overload, therefore minimize ivf if possible.  Active Problems:   Sarcoidosis of lung-  Cont her chronic prednisone 84m daily po.  No need for stress dosing at this time.   Essential hypertension-  Resume home cardiac meds, home coreg dose needs clarification   Type II diabetes mellitus with neurological manifestations, uncontrolled-  Agree with ssi, will also resume lantus dosing   Cushingoid side effect of steroids- noted   Wound infection complicating hardware- iv vanc, ID following  Order pharm to reconcile meds.  Will follow. Seen before midnight Garl Speigner A 03/27/2015, 10:52 PM

## 2015-03-28 DIAGNOSIS — B9689 Other specified bacterial agents as the cause of diseases classified elsewhere: Secondary | ICD-10-CM

## 2015-03-28 DIAGNOSIS — B9562 Methicillin resistant Staphylococcus aureus infection as the cause of diseases classified elsewhere: Secondary | ICD-10-CM

## 2015-03-28 DIAGNOSIS — I10 Essential (primary) hypertension: Secondary | ICD-10-CM

## 2015-03-28 DIAGNOSIS — T847XXA Infection and inflammatory reaction due to other internal orthopedic prosthetic devices, implants and grafts, initial encounter: Secondary | ICD-10-CM

## 2015-03-28 DIAGNOSIS — I5032 Chronic diastolic (congestive) heart failure: Secondary | ICD-10-CM

## 2015-03-28 LAB — GLUCOSE, CAPILLARY
Glucose-Capillary: 84 mg/dL (ref 65–99)
Glucose-Capillary: 96 mg/dL (ref 65–99)
Glucose-Capillary: 147 mg/dL — ABNORMAL HIGH (ref 65–99)
Glucose-Capillary: 144 mg/dL — ABNORMAL HIGH (ref 65–99)
Glucose-Capillary: 194 mg/dL — ABNORMAL HIGH (ref 65–99)

## 2015-03-28 LAB — COMPREHENSIVE METABOLIC PANEL
ALT: 13 U/L — ABNORMAL LOW (ref 14–54)
AST: 17 U/L (ref 15–41)
Albumin: 3.2 g/dL — ABNORMAL LOW (ref 3.5–5.0)
Alkaline Phosphatase: 85 U/L (ref 38–126)
Anion gap: 10 (ref 5–15)
BUN: 10 mg/dL (ref 6–20)
CO2: 27 mmol/L (ref 22–32)
Calcium: 8.8 mg/dL — ABNORMAL LOW (ref 8.9–10.3)
Chloride: 101 mmol/L (ref 101–111)
Creatinine, Ser: 0.77 mg/dL (ref 0.44–1.00)
GFR calc Af Amer: >60 mL/min (ref >60)
GFR calc non Af Amer: >60 mL/min (ref >60)
Glucose, Bld: 121 mg/dL — ABNORMAL HIGH (ref 65–99)
Potassium: 4.2 mmol/L (ref 3.5–5.1)
Sodium: 138 mmol/L (ref 135–145)
Total Bilirubin: 0.4 mg/dL (ref 0.3–1.2)
Total Protein: 5.8 g/dL — ABNORMAL LOW (ref 6.5–8.1)

## 2015-03-28 LAB — CBC
HCT: 31.6 % — ABNORMAL LOW (ref 36.0–46.0)
Hemoglobin: 10.2 g/dL — ABNORMAL LOW (ref 12.0–15.0)
MCH: 28.7 pg (ref 26.0–34.0)
MCHC: 32.3 g/dL (ref 30.0–36.0)
MCV: 89 fL (ref 78.0–100.0)
Platelets: 372 10*3/uL (ref 150–400)
RBC: 3.55 MIL/uL — ABNORMAL LOW (ref 3.87–5.11)
RDW: 14.8 % (ref 11.5–15.5)
WBC: 9.8 10*3/uL (ref 4.0–10.5)

## 2015-03-28 MED ORDER — MUPIROCIN 2 % EX OINT
TOPICAL_OINTMENT | Freq: Two times a day (BID) | CUTANEOUS | Status: DC
  Administered 2015-03-28 – 2015-03-29 (×3): via NASAL
  Filled 2015-03-28 (×2): qty 22

## 2015-03-28 MED ORDER — SODIUM CHLORIDE 0.9 % IJ SOLN
10.0000 mL | INTRAMUSCULAR | Status: DC | PRN
  Administered 2015-03-28 – 2015-03-29 (×2): 10 mL
  Filled 2015-03-28 (×2): qty 40

## 2015-03-28 MED ORDER — ENOXAPARIN SODIUM 40 MG/0.4ML ~~LOC~~ SOLN
40.0000 mg | SUBCUTANEOUS | Status: DC
  Administered 2015-03-28: 40 mg via SUBCUTANEOUS
  Filled 2015-03-28: qty 0.4

## 2015-03-28 MED ORDER — CHLORHEXIDINE GLUCONATE CLOTH 2 % EX PADS
6.0000 | MEDICATED_PAD | Freq: Every day | CUTANEOUS | Status: DC
Start: 2015-03-28 — End: 2015-03-29
  Administered 2015-03-28 – 2015-03-29 (×2): 6 via TOPICAL

## 2015-03-28 MED ORDER — VANCOMYCIN HCL 10 G IV SOLR
1250.0000 mg | Freq: Two times a day (BID) | INTRAVENOUS | Status: DC
Start: 2015-03-28 — End: 2015-03-29
  Administered 2015-03-28 – 2015-03-29 (×3): 1250 mg via INTRAVENOUS
  Filled 2015-03-28 (×5): qty 1250

## 2015-03-28 NOTE — Progress Notes (Signed)
Medicine follow-up: Patient seen in consultation earlier.  Principal problem: Wound infection, getting hardware: IV antibiotics being set up. Patient to be discharged following PICC line placement  Sarcoidosis of the lung: Stable. Continue by mouth prednisone.  Diabetes mellitus: Continued on Lantus plus sliding scale. CBGs ranging between 80-190  Essential hypertension: Stable. Blood pressures well controlled.  Chronic diastolic heart failure: Stable. Patient euvolemic  Anticipate discharge likely tonight or tomorrow morning following PICC line placement

## 2015-03-28 NOTE — Evaluation (Signed)
Physical Therapy Evaluation Patient Details Name: [name] MRN: [id number] DOB: October 01, 1961 Today's Date: 03/28/2015   History of Present Illness  Patient is a 54 y/o female admitted for left foot infection, presumed to have osteomyelitis. 2 weeks ago pt underwent I and D and hardware removal of the calcaneal osteotomy in [place].Returned to hospital due to purulent draining. Pt admitted for IV Vancomycin and plan of 6 weeks of home IV antibiotics in presumption of treating osteomyelitis of the calcaneous.   Clinical Impression  Patient presents with pain, deconditioning and balance deficits due to new NWB status LLE. Pt fatigues quickly during ambulation requiring frequent rest breaks. Education provided on energy conservation techniques. Pt reports her husband works but her parents lives next door and will be able to assist at d/c. Will continue to follow to maximize independence, endurance and mobility to minimize fall risk at home.    Follow Up Recommendations Home health PT;Supervision for mobility/OOB    Equipment Recommendations  None recommended by PT    Recommendations for Other Services       Precautions / Restrictions Precautions Precautions: Fall Required Braces or Orthoses: Other Brace/Splint Other Brace/Splint: post op shoe Restrictions Weight Bearing Restrictions: Yes LLE Weight Bearing: Non weight bearing Other Position/Activity Restrictions: However, pt reports PA Delynn Flavin states pt can put some weight through LLE with post op shoe donned and when using RW?      Mobility  Bed Mobility               General bed mobility comments: Sitting in recliner upon PT arrival.   Transfers Overall transfer level: Needs assistance Equipment used: Rolling walker (2 wheeled) Transfers: Sit to/from Stand Sit to Stand: Min guard         General transfer comment: Min guard for safety.  Ambulation/Gait Ambulation/Gait assistance: Min guard Ambulation  Distance (Feet): 40 Feet Assistive device: Rolling walker (2 wheeled) Gait Pattern/deviations: Step-to pattern;Trunk flexed   Gait velocity interpretation: Below normal speed for age/gender General Gait Details: "hop to" gait pattern with multiple rest breaks due to fatigue. Compliant with NWB.  Stairs            Wheelchair Mobility    Modified Rankin (Stroke Patients Only)       Balance Overall balance assessment: Needs assistance Sitting-balance support: Feet supported;No upper extremity supported Sitting balance-Leahy Scale: Good     Standing balance support: During functional activity Standing balance-Leahy Scale: Poor                               Pertinent Vitals/Pain Pain Assessment: Faces Faces Pain Scale: Hurts a little bit Pain Location: LLE Pain Descriptors / Indicators: Sore Pain Intervention(s): Monitored during session;Repositioned    Home Living Family/patient expects to be discharged to:: Private residence Living Arrangements: Spouse/significant other;Parent Available Help at Discharge: Family;Available PRN/intermittently Type of Home: House Home Access: Stairs to enter Entrance Stairs-Rails: Right Entrance Stairs-Number of Steps: 1 Home Layout: One level Home Equipment: Walker - 2 wheels;Bedside commode Additional Comments: Parents lives next door and can assist at d/c.    Prior Function Level of Independence: Independent               Hand Dominance        Extremity/Trunk Assessment   Upper Extremity Assessment: Defer to OT evaluation           Lower Extremity Assessment: Generalized weakness  Communication   Communication: No difficulties  Cognition Arousal/Alertness: Awake/alert Behavior During Therapy: WFL for tasks assessed/performed Overall Cognitive Status: Within Functional Limits for tasks assessed                      General Comments      Exercises General Exercises - Lower  Extremity Quad Sets: Left;10 reps;Seated Long Arc Quad: Left;10 reps;Seated Hip Flexion/Marching: Left;10 reps;Seated      Assessment/Plan    PT Assessment Patient needs continued PT services  PT Diagnosis Acute pain;Generalized weakness;Difficulty walking   PT Problem List Decreased strength;Pain;Decreased activity tolerance;Decreased balance;Decreased mobility  PT Treatment Interventions Balance training;Gait training;Stair training;Functional mobility training;Therapeutic activities;Therapeutic exercise;Patient/family education   PT Goals (Current goals can be found in the Care Plan section) Acute Rehab PT Goals Patient Stated Goal: to go home PT Goal Formulation: With patient Time For Goal Achievement: 04/11/15 Potential to Achieve Goals: Good    Frequency Min 3X/week   Barriers to discharge        Co-evaluation               End of Session Equipment Utilized During Treatment: Gait belt Activity Tolerance: Patient tolerated treatment well;Patient limited by fatigue Patient left: in chair;with call bell/phone within reach Nurse Communication: Mobility status         Time: 1030-1045 PT Time Calculation (min) (ACUTE ONLY): 15 min   Charges:   PT Evaluation $Initial PT Evaluation Tier I: 1 Procedure     PT G Codes:        Tejal Monroy A Doreene Forrey 03/28/2015, 12:15 PM  Wray Kearns, PT, DPT [phone]

## 2015-03-28 NOTE — Progress Notes (Signed)
[facility]  Patient Status: New pt this admission for [facility]  [facility] is providing the following services: HHRN, PT and Home Infusion Pharmacy team for home IV ABX.  [facility] hospital infusion coordinator will provide in hospital DC teaching to support independence at home with IV ABX set up and self administration.  [place] weekend hospital team available to support DC home.      NOTE:  Please obtain IV ABX orders and fax to [facility] pharmacy team attention at [phone].    If patient discharges after hours, please call [phone].   Larry Sierras 03/28/2015, 12:46 PM

## 2015-03-28 NOTE — Progress Notes (Signed)
Peripherally Inserted Central Catheter/Midline Placement  The IV Nurse has discussed with the patient and/or persons authorized to consent for the patient, the purpose of this procedure and the potential benefits and risks involved with this procedure.  The benefits include less needle sticks, lab draws from the catheter and patient may be discharged home with the catheter.  Risks include, but not limited to, infection, bleeding, blood clot (thrombus formation), and puncture of an artery; nerve damage and irregular heat beat.  Alternatives to this procedure were also discussed.  PICC/Midline Placement Documentation        [name], [name] 03/28/2015, 6:45 PM

## 2015-03-28 NOTE — Care Management Note (Signed)
Case Management Note  Patient Details  Name: [name] MRN: [id number] Date of Birth: [birth year]  Subjective/Objective:                 Left foot postop wound infection   Action/Plan:  Spoke with patient and her parents about home IV antibiotics, they will be assisting patient. She chose [facility], contacted Stanton Kidney with [facility], they are not able to service the patient due to staffing issues. [facility] chose [facility]. Contacted [facility] at [facility] and [facility] ,IV antibiotics and HHPT. CM will continue to follow.   Expected Discharge Date:                  Expected Discharge Plan:  [place]  In-House Referral:  NA  Discharge planning Services  CM Consult  Post Acute Care Choice:  Home Health Choice offered to:  Patient  DME Arranged:  IV pump/equipment DME Agency:  [place]:  PT [facility] Agency:  [place]  Status of Service:  In process, will continue to follow  Medicare Important Message Given:    Date Medicare IM Given:    Medicare IM give by:    Date Additional Medicare IM Given:    Additional Medicare Important Message give by:     If discussed at [place] of Stay Meetings, dates discussed:    Additional Comments:  Nila Nephew, RN 03/28/2015, 11:50 AM

## 2015-03-28 NOTE — H&P (Signed)
[name] is an 54 y.o. female.   Chief Complaint: left foot infected post operative wound HPI: 45 yowf with multiple medical problems underwent  surgery 2 months ago in [place] for a 5 metatarsal hardware breakage.  1 year ago she fractured her 5th metatarsal.  She developed a non union of the fracture site.  In [place], she underwent OR IF with bone grafting of her 5th metatarsal non union.  This did well until she tripped down her stairs and broke her 5th metatarsal again and her retained screw.  She was referred to a foot and ankle specialist in [place] for Surgery.  3 months ago she underwent hardware removal and OR IF of her 5th metatarsal with a Mid foot reconstruction and calcaneal osteotomy.  All of these wounds healed without difficulty except the calcaneal osteotomy.  4 weeks ago she presented to our office with a draining wound over her calcaneal osteotomy site.  Left foot and whole lower leg were red and swollen.  Dopplers showed there was no DVT  Wound was cultured. Cultures at [facility] lab grew MRSA and Multiresistant Coag Neg Staph.  She was started on Septra DS 800 mg 2 pills BID and Rifampin.  She was unable to tolerate the Rifampin. 2 weeks ago she underwent I and D and hardware removal of the calcaneal osteotomy in [place].  Yesterday she presented with persistent drainage from her wound with redness and swelling.  The decision was made to admit for IV Vancomycin and plan of 6 weeks of home IV antibiotics in presumption of treating osteomyelitis of the calcaneous.   Past Medical History  Diagnosis Date  . DIABETES MELLITUS, TYPE II 08/21/2006  . HYPERLIPIDEMIA 08/21/2006  . GOUT 08/20/2010  . OBESITY 06/04/2009  . ANEMIA-UNSPECIFIED 09/18/2009  . HYPERTENSION 08/21/2006  . GERD 08/21/2006  . SLEEP APNEA 04/21/2009    last testing was negative  . Internal hemorrhoids   . Pulmonary sarcoidosis     Followed locally by pulmonology, but also by Dr. Casper Harrison at [facility]  . Exertional chest pain     sharp, substernal, exertional  . Vocal cord dysfunction   . Chronic diastolic heart failure, NYHA class 2     LVEDP roughly 20% by cath  . CHF (congestive heart failure)   . COPD (chronic obstructive pulmonary disease)   . Depression   . Fracture of 5th metatarsal     non union  . Abnormal SPEP 04/17/2014    Past Surgical History  Procedure Laterality Date  . Ventral hernia repair    . Nissen fundoplication  4818  . Cholecystectomy  1984  . Abdominal hysterectomy    . Knee arthroscopy      right  . Tubal ligation      with reversal in 1994  . Bladder suspension  11/11/2011    Procedure: TRANSVAGINAL TAPE (TVT) PROCEDURE;  Surgeon: Olga Millers, MD;  Location: [facility];  Service: Gynecology;  Laterality: N/A;  . Cystoscopy  11/11/2011    Procedure: CYSTOSCOPY;  Surgeon: Olga Millers, MD;  Location: [facility];  Service: Gynecology;  Laterality: N/A;  . Doppler echocardiography  02/12/2013    LV FUNCTION, SIZE NORMAL; MILD CONCENTRIC LVH; EST EF 55-65%; WALL MOTION NORMAL  . Lexiscan myoview  03/09/2013    EF 50%; NORMAL MYOCARDIAL PERFUSION STUDY - breast attenuation  . Cardiac catheterization  07/2010    LVEF 50-55% WITH VERY MILD GLOBAL HYPOKINESIA; ESSENTIALLY NORMAL CORONARY ARTERIES; NORMAL LV FUNCTION  .  Carotids  02/18/11    CAROTID DUPLEX; VERTEBRALS ARE PATENT WITH ANTEGRADE FLOW. ICA/CCA RATIO 1.61 ON RIGHT AND 0.75 ON LEFT  . Right and left cardiac catheterization  04/23/2013    Angiographic normal coronaries; LVEDP 20 mmHg, PCWP 12-14 mmHg, RAP 12 mmHg.; Fick CO/CI 4.9/2.2  . Metatarsal osteotomy with open reduction internal fixation (orif) metatarsal with fusion Left 04/09/2014    Procedure: LEFT FOOT FRACTURE OPEN TREATMENT METATARSAL INCLUDES INTERNAL FIXATION EACH;  Surgeon: Lorn Junes, MD;  Location: [place];  Service: Orthopedics;  Laterality: Left;  . Left and right heart catheterization with coronary  angiogram N/A 04/23/2013    Procedure: LEFT AND RIGHT HEART CATHETERIZATION WITH CORONARY ANGIOGRAM;  Surgeon: Leonie Man, MD;  Location: [facility] C[facility] LAB;  Service: Cardiovascular;  Laterality: N/A;    Family History  Problem Relation Age of Onset  . Diabetes Father   . Heart attack Father   . Coronary artery disease Father   . Heart failure Father   . COPD Mother   . Emphysema Mother   . Asthma Mother   . Heart failure Mother   . Sarcoidosis Maternal Uncle   . Colon cancer Neg Hx   . Lung cancer Brother   . Cancer Brother   . Diabetes Brother   . Heart attack Maternal Grandfather    Social History:  reports that she has never smoked. She has never used smokeless tobacco. She reports that she does not drink alcohol or use illicit drugs.  Allergies:  Allergies  Allergen Reactions  . Lisinopril Cough  . Methotrexate     REACTION: peri-oral and buccal lesions.  . Clindamycin/Lincomycin Nausea And Vomiting and Rash    Medications Prior to Admission  Medication Sig Dispense Refill  . allopurinol (ZYLOPRIM) 100 MG tablet Take 100 mg by mouth daily.     [name] aspirin EC 81 MG tablet Take 81 mg by mouth daily.     [name] atorvastatin (LIPITOR) 40 MG tablet Take one tablet by mouth one time daily (Patient taking differently: Take one tablet by mouth every evening.) 90 tablet 3  . buPROPion (WELLBUTRIN XL) 300 MG 24 hr tablet Take 1 tablet (300 mg total) by mouth daily. 30 tablet 10  . CARTIA XT 120 MG 24 hr capsule TAKE ONE CAPSULE BY MOUTH ONE TIME DAILY IN THE EVENING  30 capsule 11  . carvedilol (COREG) 25 MG tablet TAKE ONE TABLET BY MOUTH TWICE DAILY with a meal. 180 tablet 1  . cholecalciferol 5000 UNITS TABS Take 1 tablet (5,000 Units total) by mouth daily. 30 tablet 3  . DEXILANT 60 MG capsule TAKE ONE CAPSULE BY MOUTH ONE TIME DAILY 30 capsule 6  . estradiol (ESTRACE) 2 MG tablet Take 2 mg by mouth at bedtime.     . fenofibrate (TRICOR) 145 MG tablet TAKE ONE TABLET BY MOUTH ONE  TIME DAILY  30 tablet 10  . fluticasone (FLONASE) 50 MCG/ACT nasal spray USE TWO SPRAYS IN EACH NOSTRIL DAILY 16 g 4  . furosemide (LASIX) 40 MG tablet Take one tablet by mouth twice daily    . gabapentin (NEURONTIN) 100 MG capsule TAKE ONE CAPSULE BY MOUTH THREE TIMES DAILY 90 capsule 3  . insulin aspart (NOVOLOG) 100 UNIT/ML injection Inject 10-18 Units into the skin 3 (three) times daily before meals.    . Insulin Glargine (TOUJEO SOLOSTAR) 300 UNIT/ML SOPN Inject 40 Units into the skin at bedtime. 3 pen 2  . losartan (COZAAR) 100  MG tablet TAKE ONE TABLET BY MOUTH ONE TIME DAILY  90 tablet 1  . metFORMIN (GLUCOPHAGE) 1000 MG tablet TAKE ONE TABLET BY MOUTH TWICE DAILY with a meal. 180 tablet 2  . mometasone-formoterol (DULERA) 200-5 MCG/ACT AERO Inhale 2 puffs into the lungs 2 (two) times daily. 1 Inhaler 6  . mupirocin ointment (BACTROBAN) 2 % Apply 1 application topically daily.   0  . PARoxetine Mesylate (BRISDELLE) 7.5 MG CAPS Take 7.5 mg by mouth daily.    . potassium chloride SA (K-DUR,KLOR-CON) 20 MEQ tablet TAKE ONE TABLET BY MOUTH THREE TIMES DAILY 90 tablet 1  . predniSONE (DELTASONE) 5 MG tablet Take 1 tablet (5 mg total) by mouth daily with breakfast. Sig 29ms daily    . sulfamethoxazole-trimethoprim (BACTRIM DS,SEPTRA DS) 800-160 MG per tablet Take 2 tablets by mouth 2 (two) times daily.   0  . venlafaxine XR (EFFEXOR-XR) 75 MG 24 hr capsule Take 1 capsule (75 mg total) by mouth 2 (two) times daily. 60 capsule 11  . [DISCONTINUED] Calcium Citrate 200 MG TABS Take 1,200 mg by mouth daily.     .Marland Kitchenalbuterol (PROVENTIL HFA;VENTOLIN HFA) 108 (90 BASE) MCG/ACT inhaler Inhale 1-2 puffs into the lungs every 6 (six) hours as needed for wheezing or shortness of breath. For wheezing.    . chlorpheniramine-HYDROcodone (TUSSIONEX) 10-8 MG/5ML SUER Take 5 mLs by mouth every 6 (six) hours as needed. (Patient taking differently: Take 5 mLs by mouth every 6 (six) hours as needed for cough. ) 140 mL 0   . glucose blood (ONE TOUCH ULTRA TEST) test strip 1 each by Other route 3 (three) times daily. Use as instructed 100 each 5  . HYDROcodone-acetaminophen (NORCO) 10-325 MG per tablet Take 1-2 tablets by mouth every 4 (four) hours as needed for moderate pain.   0  . nitroGLYCERIN (NITROSTAT) 0.4 MG SL tablet Place 0.4 mg under the tongue every 5 (five) minutes as needed. x3 doses as needed for chest pain.    . [DISCONTINUED] albuterol (PROVENTIL) (5 MG/ML) 0.5% nebulizer solution Take 2.5 mg by nebulization every 6 (six) hours as needed. For wheezing.    . [DISCONTINUED] B-D ULTRAFINE III SHORT PEN 31G X 8 MM MISC Follow directions provided by physician 30 each 2  . [DISCONTINUED] benzonatate (TESSALON) 200 MG capsule Take 200 mg by mouth as needed for cough.     . [DISCONTINUED] Blood Glucose Monitoring Suppl (FREESTYLE FREEDOM LITE) W/DEVICE KIT Use as adviced 1 each 0    Results for orders placed or performed during the hospital encounter of 03/27/15 (from the past 48 hour(s))  CBC WITH DIFFERENTIAL     Status: Abnormal   Collection Time: 03/27/15  6:02 PM  Result Value Ref Range   WBC 12.0 (H) 4.0 - 10.5 K/uL   RBC 3.65 (L) 3.87 - 5.11 MIL/uL   Hemoglobin 10.4 (L) 12.0 - 15.0 g/dL   HCT 32.4 (L) 36.0 - 46.0 %   MCV 88.8 78.0 - 100.0 fL   MCH 28.5 26.0 - 34.0 pg   MCHC 32.1 30.0 - 36.0 g/dL   RDW 14.7 11.5 - 15.5 %   Platelets 374 150 - 400 K/uL   Neutrophils Relative % 64 43 - 77 %   Neutro Abs 7.8 (H) 1.7 - 7.7 K/uL   Lymphocytes Relative 25 12 - 46 %   Lymphs Abs 3.0 0.7 - 4.0 K/uL   Monocytes Relative 7 3 - 12 %   Monocytes Absolute 0.8 0.1 -  1.0 K/uL   Eosinophils Relative 4 0 - 5 %   Eosinophils Absolute 0.4 0.0 - 0.7 K/uL   Basophils Relative 0 0 - 1 %   Basophils Absolute 0.0 0.0 - 0.1 K/uL  Comprehensive metabolic panel     Status: Abnormal   Collection Time: 03/27/15  6:02 PM  Result Value Ref Range   Sodium 136 135 - 145 mmol/L   Potassium 3.8 3.5 - 5.1 mmol/L    Chloride 98 (L) 101 - 111 mmol/L   CO2 26 22 - 32 mmol/L   Glucose, Bld 120 (H) 65 - 99 mg/dL   BUN 13 6 - 20 mg/dL   Creatinine, Ser 0.90 0.44 - 1.00 mg/dL   Calcium 8.9 8.9 - 10.3 mg/dL   Total Protein 6.4 (L) 6.5 - 8.1 g/dL   Albumin 3.6 3.5 - 5.0 g/dL   AST 19 15 - 41 U/L   ALT 12 (L) 14 - 54 U/L   Alkaline Phosphatase 84 38 - 126 U/L   Total Bilirubin 0.5 0.3 - 1.2 mg/dL   GFR calc non Af Amer >60 >60 mL/min   GFR calc Af Amer >60 >60 mL/min    Comment: (NOTE) The eGFR has been calculated using the CKD EPI equation. This calculation has not been validated in all clinical situations. eGFR's persistently <60 mL/min signify possible Chronic Kidney Disease.    Anion gap 12 5 - 15  Glucose, capillary     Status: Abnormal   Collection Time: 03/27/15 10:26 PM  Result Value Ref Range   Glucose-Capillary 189 (H) 65 - 99 mg/dL  CBC     Status: Abnormal   Collection Time: 03/28/15  4:20 AM  Result Value Ref Range   WBC 9.8 4.0 - 10.5 K/uL   RBC 3.55 (L) 3.87 - 5.11 MIL/uL   Hemoglobin 10.2 (L) 12.0 - 15.0 g/dL   HCT 31.6 (L) 36.0 - 46.0 %   MCV 89.0 78.0 - 100.0 fL   MCH 28.7 26.0 - 34.0 pg   MCHC 32.3 30.0 - 36.0 g/dL   RDW 14.8 11.5 - 15.5 %   Platelets 372 150 - 400 K/uL  Comprehensive metabolic panel     Status: Abnormal   Collection Time: 03/28/15  4:20 AM  Result Value Ref Range   Sodium 138 135 - 145 mmol/L   Potassium 4.2 3.5 - 5.1 mmol/L   Chloride 101 101 - 111 mmol/L   CO2 27 22 - 32 mmol/L   Glucose, Bld 121 (H) 65 - 99 mg/dL   BUN 10 6 - 20 mg/dL   Creatinine, Ser 0.77 0.44 - 1.00 mg/dL   Calcium 8.8 (L) 8.9 - 10.3 mg/dL   Total Protein 5.8 (L) 6.5 - 8.1 g/dL   Albumin 3.2 (L) 3.5 - 5.0 g/dL   AST 17 15 - 41 U/L   ALT 13 (L) 14 - 54 U/L   Alkaline Phosphatase 85 38 - 126 U/L   Total Bilirubin 0.4 0.3 - 1.2 mg/dL   GFR calc non Af Amer >60 >60 mL/min   GFR calc Af Amer >60 >60 mL/min    Comment: (NOTE) The eGFR has been calculated using the CKD EPI  equation. This calculation has not been validated in all clinical situations. eGFR's persistently <60 mL/min signify possible Chronic Kidney Disease.    Anion gap 10 5 - 15  Glucose, capillary     Status: None   Collection Time: 03/28/15  6:38 AM  Result Value Ref  Range   Glucose-Capillary 84 65 - 99 mg/dL   Comment 1 Notify RN    Comment 2 Repeat Test   Glucose, capillary     Status: None   Collection Time: 03/28/15  6:58 AM  Result Value Ref Range   Glucose-Capillary 96 65 - 99 mg/dL   *Note: Due to a large number of results and/or encounters for the requested time period, some results have not been displayed. A complete set of results can be found in Results Review.   No results found.  Review of Systems  Constitutional: Negative.   HENT: Negative.   Eyes: Negative.   Respiratory: Negative.   Cardiovascular: Positive for leg swelling. Negative for chest pain, palpitations, orthopnea, claudication and PND.  Gastrointestinal: Negative.   Genitourinary: Negative.   Musculoskeletal: Positive for joint pain.       Left heel pain and draining wound  Skin:       Redness and swelling in left lower leg  Neurological: Negative.   Endo/Heme/Allergies: Negative.   Psychiatric/Behavioral: Negative.     Blood pressure 142/57, pulse 81, temperature 98 F (36.7 C), resp. rate 16, height _0  (1.676 m), weight 126.2 kg (278 lb 3.5 oz), SpO2 99 %. Physical Exam  Constitutional: She is oriented to person, place, and time. She appears well-developed and well-nourished.  HENT:  Head: Normocephalic and atraumatic.  Mouth/Throat: Oropharynx is clear and moist.  Eyes: Conjunctivae and EOM are normal. Pupils are equal, round, and reactive to light.  Neck: Neck supple.  Cardiovascular: Normal rate.   Respiratory: Effort normal.  GI: Soft.  Genitourinary:  Not pertinent to current symptomatology therefore not examined.  Musculoskeletal:  Left foot red and swollen with draining wound at  left heel medially.  Decreased sensation secondary to diabetic neuropathy.  Neurological: She is alert and oriented to person, place, and time.  Skin: Skin is warm and dry.  Psychiatric: She has a normal mood and affect. Her behavior is normal.     Assessment Principal Problem:   Wound infection complicating hardware Active Problems:   Sarcoidosis of lung   Gout   Essential hypertension   GERD   Type II diabetes mellitus with neurological manifestations, uncontrolled   Chronic diastolic heart failure, NYHA class 2   Cushingoid side effect of steroids   MRSA (methicillin resistant staph aureus) culture positive   Plan This patient was admitted for IV antibiotic PICC line placement and set up of home health antibiotics.  Medicine was consulted due to her extensive comorbidities.  Infectious disease was consulted due to her multiresistant organisms.  Jayde Daffin J 03/28/2015, 9:40 AM

## 2015-03-28 NOTE — Assessment & Plan Note (Signed)
Left foot post operative wound infection from surgery 2 months ago in [place] persists despite PO antibiotics of Septra DS 800 mg BID and I & D with hardware removal 2 weeks ago.  Cultures at [facility] lab grew MRSA and Multiresistant Coag Neg Staph.

## 2015-03-28 NOTE — Consult Note (Signed)
[place] for Infectious Disease     Reason for Consult: osteomyelitis    Referring Physician: Dr. Shirlee More  Principal Problem:   Wound infection complicating hardware Active Problems:   Sarcoidosis of lung   Gout   Essential hypertension   GERD   Type II diabetes mellitus with neurological manifestations, uncontrolled   Chronic diastolic heart failure, NYHA class 2   Cushingoid side effect of steroids   MRSA (methicillin resistant staph aureus) culture positive   . allopurinol  100 mg Oral Daily  . aspirin EC  81 mg Oral Daily  . atorvastatin  40 mg Oral q1800  . buPROPion  300 mg Oral Daily  . calcium citrate  1,200 mg of elemental calcium Oral Daily  . carvedilol  25 mg Oral BID WC  . Chlorhexidine Gluconate Cloth  6 each Topical Q0600  . cholecalciferol  5,000 Units Oral Daily  . diltiazem  120 mg Oral Daily  . enoxaparin (LOVENOX) injection  40 mg Subcutaneous Q24H  . estradiol  2 mg Oral QHS  . fenofibrate  145 mg Oral Daily  . fluticasone  2 spray Each Nare Daily  . furosemide  40 mg Oral BID  . gabapentin  100 mg Oral TID  . insulin aspart  0-20 Units Subcutaneous TID WC  . insulin glargine  40 Units Subcutaneous QHS  . losartan  100 mg Oral Daily  . mometasone-formoterol  2 puff Inhalation BID  . mupirocin ointment   Nasal BID  . potassium chloride SA  20 mEq Oral TID  . predniSONE  5 mg Oral Q breakfast  . rifampin  300 mg Oral Q12H  . vancomycin  1,250 mg Intravenous Q12H  . venlafaxine XR  75 mg Oral BID    Recommendations: Vancomycin, trough goal of 15-20, for 6 weeks Weekly cbc, cmp, vanco trough per home health to [facility] Rifampin 300 mg bid if tolerated  picc Will check CRP, ESR baseline We will arrange follow up in 2-3 weeks at [facility]  Thanks for consult  Assessment: She has presumed osteomyelitis based on hardware, drainage.  Culture of wound with MRSA and CoNS. From the report, it does not appear that this was a deep culture but  a superficial swab.    Antibiotics: Was on bactrim + rifampin  HPI: [name] is a 54 y.o. female with multiple medical problems who initially had a fracture of her 5th metatarsal and developed non union.  Underwent ORIF with bone grafting but then fell and broke the 5th metatarsal again.  She underwent hardware removal and ORIF again 3 months ago with midfoot reconstruction.  The calcaneal osteotomy did not heal well and about 1 month ago noted draiange, which she describes as white, pus like drainage.  Developed swelling of foot and leg.  She was started on Septra DS, 2 pills bid with rifampin though did not resolve.  Underwent I and D in [place] with associated hardware removal but presented here to Dr. Percell Miller with persistent drainage.  No fever, no chills.     Review of Systems: A comprehensive review of systems was negative.  Past Medical History  Diagnosis Date  . DIABETES MELLITUS, TYPE II 08/21/2006  . HYPERLIPIDEMIA 08/21/2006  . GOUT 08/20/2010  . OBESITY 06/04/2009  . ANEMIA-UNSPECIFIED 09/18/2009  . HYPERTENSION 08/21/2006  . GERD 08/21/2006  . SLEEP APNEA 04/21/2009    last testing was negative  . Internal hemorrhoids   . Pulmonary sarcoidosis  Followed locally by pulmonology, but also by [facility] Casper Harrison [facility]  . Exertional chest pain     sharp, substernal, exertional  . Vocal cord dysfunction   . Chronic diastolic heart failure, NYHA class 2     LVEDP roughly 20% by cath  . CHF (congestive heart failure)   . COPD (chronic obstructive pulmonary disease)   . Depression   . Fracture of 5th metatarsal     non union  . Abnormal SPEP 04/17/2014    History  Substance Use Topics  . Smoking status: Never Smoker   . Smokeless tobacco: Never Used  . Alcohol Use: No    Family History  Problem Relation Age of Onset  . Diabetes Father   . Heart attack Father   . Coronary artery disease Father   . Heart failure Father   . COPD Mother   .  Emphysema Mother   . Asthma Mother   . Heart failure Mother   . Sarcoidosis Maternal Uncle   . Colon cancer Neg Hx   . Lung cancer Brother   . Cancer Brother   . Diabetes Brother   . Heart attack Maternal Grandfather    Allergies  Allergen Reactions  . Lisinopril Cough  . Methotrexate     REACTION: peri-oral and buccal lesions.  . Clindamycin/Lincomycin Nausea And Vomiting and Rash    OBJECTIVE: Blood pressure 142/57, pulse 81, temperature 98 F (36.7 C), resp. rate 16, height 5' 6"  (1.676 m), weight 278 lb 3.5 oz (126.2 kg), SpO2 99 %. General: awake, alert, nad Skin: no rashes Lungs: CTA B Cor: RRR Abdomen: soft, nt, nd Ext: left foot wrapped  Microbiology: No results found for this or any previous visit (from the past 240 hour(s)).  Scharlene Gloss, [place] [facility] Infectious Disease [place] [email or web address] [phone] pager  [phone] cell 03/28/2015, 10:58 AM

## 2015-03-28 NOTE — Care Management (Signed)
Utilization review completed by Alaria Oconnor N. Kayleanna Lorman, RN BSN 

## 2015-03-29 LAB — CBC
HCT: 31 % — ABNORMAL LOW (ref 36.0–46.0)
Hemoglobin: 9.8 g/dL — ABNORMAL LOW (ref 12.0–15.0)
MCH: 28 pg (ref 26.0–34.0)
MCHC: 31.6 g/dL (ref 30.0–36.0)
MCV: 88.6 fL (ref 78.0–100.0)
Platelets: 325 10*3/uL (ref 150–400)
RBC: 3.5 MIL/uL — ABNORMAL LOW (ref 3.87–5.11)
RDW: 14.6 % (ref 11.5–15.5)
WBC: 9 10*3/uL (ref 4.0–10.5)

## 2015-03-29 LAB — COMPREHENSIVE METABOLIC PANEL
ALT: 14 U/L (ref 14–54)
AST: 23 U/L (ref 15–41)
Albumin: 3 g/dL — ABNORMAL LOW (ref 3.5–5.0)
Alkaline Phosphatase: 89 U/L (ref 38–126)
Anion gap: 7 (ref 5–15)
BUN: 9 mg/dL (ref 6–20)
CO2: 29 mmol/L (ref 22–32)
Calcium: 8.7 mg/dL — ABNORMAL LOW (ref 8.9–10.3)
Chloride: 102 mmol/L (ref 101–111)
Creatinine, Ser: 0.72 mg/dL (ref 0.44–1.00)
GFR calc Af Amer: >60 mL/min (ref >60)
GFR calc non Af Amer: >60 mL/min (ref >60)
Glucose, Bld: 144 mg/dL — ABNORMAL HIGH (ref 65–99)
Potassium: 3.9 mmol/L (ref 3.5–5.1)
Sodium: 138 mmol/L (ref 135–145)
Total Bilirubin: 0.7 mg/dL (ref 0.3–1.2)
Total Protein: 6 g/dL — ABNORMAL LOW (ref 6.5–8.1)

## 2015-03-29 LAB — GLUCOSE, CAPILLARY: Glucose-Capillary: 127 mg/dL — ABNORMAL HIGH (ref 65–99)

## 2015-03-29 MED ORDER — HEPARIN SOD (PORK) LOCK FLUSH 100 UNIT/ML IV SOLN
250.0000 [IU] | INTRAVENOUS | Status: AC | PRN
  Administered 2015-03-29: 250 [IU]

## 2015-03-29 MED ORDER — VANCOMYCIN HCL 10 G IV SOLR: 1250.0000 mg | Freq: Two times a day (BID) | INTRAVENOUS | Status: DC

## 2015-03-29 MED ORDER — HIBICLENS HAND PUMP GALLON MISC: Status: DC

## 2015-03-29 MED ORDER — MUPIROCIN 2 % EX OINT: TOPICAL_OINTMENT | CUTANEOUS | Status: DC

## 2015-03-29 MED ORDER — RIFAMPIN 300 MG PO CAPS: 300.0000 mg | ORAL_CAPSULE | Freq: Two times a day (BID) | ORAL | Status: DC

## 2015-03-29 NOTE — Care Management Note (Signed)
Case Management Note  Patient Details  Name: [name] MRN: [id number] Date of Birth: August 20, 1961  Subjective/Objective:                  Wound infection complicating hardware  Action/Plan: Discharge planning  Expected Discharge Date:  03/29/15             Expected Discharge Plan:  [place]  In-House Referral:  NA  Discharge planning Services  CM Consult  Post Acute Care Choice:  Home Health Choice offered to:  Patient  DME Arranged:  IV pump/equipment DME Agency:  [place]:  PT, RN [facility] Agency:  [place]  Status of Service:  Completed, signed off  Medicare Important Message Given:    Date Medicare IM Given:    Medicare IM give by:    Date Additional Medicare IM Given:    Additional Medicare Important Message give by:     If discussed at [place] of Stay Meetings, dates discussed:    Additional Comments: CM spoke to patient and offered choice. Pt states that she prefer [facility]. Cm asked about any additional assistive devices needed and pt denies needs and states that she has a walker at home if needed. Per pt note, pt is ambulating with assistance and patient states that she will have her husband at home for help as well as her parents who live next door that she will be staying with at this time. CM called [facility] and spoke to [facility] @ [phone] to advised of need for [facility] RN/PT including infusion for Vancomycin to start 03/29/15 @ 8pm. Order for Vancomycin faxed to [facility] and confirmation of fax received. No additional CM needs identified.   Guido Sander, RN 03/29/2015, 10:01 AM

## 2015-03-29 NOTE — Progress Notes (Addendum)
Patient given discharge instructions as well as prescription from MD. PICC line right upper arm in place and heparin flushed per IV team. Husband at the bedside and transport notified.  Desmond Dike RN

## 2015-03-29 NOTE — Discharge Summary (Signed)
Patient ID: [name] MRN: [id number] DOB/AGE: [birth year] 54 y.o.  Admit date: 03/27/2015 Discharge date: 03/29/2015  Admission Diagnoses:  Principal Problem:   Wound infection complicating hardware Active Problems:   Sarcoidosis of lung   Gout   Essential hypertension   GERD   Type II diabetes mellitus with neurological manifestations, uncontrolled   Chronic diastolic heart failure, NYHA class 2   Cushingoid side effect of steroids   MRSA (methicillin resistant staph aureus) culture positive   Discharge Diagnoses:  Same  Past Medical History  Diagnosis Date  . DIABETES MELLITUS, TYPE II 08/21/2006  . HYPERLIPIDEMIA 08/21/2006  . GOUT 08/20/2010  . OBESITY 06/04/2009  . ANEMIA-UNSPECIFIED 09/18/2009  . HYPERTENSION 08/21/2006  . GERD 08/21/2006  . SLEEP APNEA 04/21/2009    last testing was negative  . Internal hemorrhoids   . Pulmonary sarcoidosis     Followed locally by pulmonology, but also by [name] at [facility]  . Exertional chest pain     sharp, substernal, exertional  . Vocal cord dysfunction   . Chronic diastolic heart failure, NYHA class 2     LVEDP roughly 20% by cath  . CHF (congestive heart failure)   . COPD (chronic obstructive pulmonary disease)   . Depression   . Fracture of 5th metatarsal     non union  . Abnormal SPEP 04/17/2014    Surgeries:  on * No surgery found *   Consultants:  Medicine and Infectious Disease  Discharged Condition: Improved  Hospital Course: [name] is an 54 y.o. female who was admitted 03/27/2015 for non operative treatment ofWound infection complicating hardware. Patient has severe unremitting pain that affects sleep, daily activities, and work/hobbies.   Patient was given perioperative antibiotics: Anti-infectives    Start     Dose/Rate Route Frequency Ordered Stop   03/29/15 0000  rifampin (RIFADIN) 300 MG capsule     300 mg Oral Every 12 hours 03/29/15 0920     03/29/15 0000   vancomycin 1,250 mg in sodium chloride 0.9 % 250 mL     1,250 mg 166.7 mL/hr over 90 Minutes Intravenous Every 12 hours 03/29/15 0920     03/28/15 0800  vancomycin (VANCOCIN) 1,250 mg in sodium chloride 0.9 % 250 mL IVPB     1,250 mg 166.7 mL/hr over 90 Minutes Intravenous Every 12 hours 03/28/15 0002     03/28/15 0000  rifampin (RIFADIN) capsule 300 mg     300 mg Oral Every 12 hours 03/27/15 2332     03/27/15 1800  vancomycin (VANCOCIN) 2,000 mg in sodium chloride 0.9 % 500 mL IVPB     2,000 mg 250 mL/hr over 120 Minutes Intravenous  Once 03/27/15 1719 03/27/15 2104       Patient was given sequential compression devices, early ambulation, and chemoprophylaxis to prevent DVT.  Patient benefited maximally from hospital stay and there were no complications.    Recent vital signs: Patient Vitals for the past 24 hrs:  BP Temp Temp src Pulse Resp SpO2 Weight  03/29/15 0852 - - - - - 100 % -  03/29/15 0500 (!) 129/54 mmHg 98.2 F (36.8 C) - 82 18 97 % 126.2 kg (278 lb 3.5 oz)  03/28/15 2053 - - - 80 18 100 % -  03/28/15 2000 (!) 115/47 mmHg 98.3 F (36.8 C) Oral 71 17 98 % -  03/28/15 1615 (!) 142/57 mmHg - - - - - -     Recent laboratory studies:  Recent Labs  03/28/15 0420 03/29/15 0522  WBC 9.8 9.0  HGB 10.2* 9.8*  HCT 31.6* 31.0*  PLT 372 325  NA 138 138  K 4.2 3.9  CL 101 102  CO2 27 29  BUN 10 9  CREATININE 0.77 0.72  GLUCOSE 121* 144*  CALCIUM 8.8* 8.7*     Discharge Medications:     Medication List    STOP taking these medications        sulfamethoxazole-trimethoprim 800-160 MG per tablet  Commonly known as:  BACTRIM DS,SEPTRA DS      TAKE these medications        albuterol 108 (90 BASE) MCG/ACT inhaler  Commonly known as:  PROVENTIL HFA;VENTOLIN HFA  Inhale 1-2 puffs into the lungs every 6 (six) hours as needed for wheezing or shortness of breath. For wheezing.     allopurinol 100 MG tablet  Commonly known as:  ZYLOPRIM  Take 100 mg by mouth  daily.     aspirin EC 81 MG tablet  Take 81 mg by mouth daily.     atorvastatin 40 MG tablet  Commonly known as:  LIPITOR  Take one tablet by mouth one time daily     BRISDELLE 7.5 MG Caps  Generic drug:  PARoxetine Mesylate  Take 7.5 mg by mouth daily.     buPROPion 300 MG 24 hr tablet  Commonly known as:  WELLBUTRIN XL  Take 1 tablet (300 mg total) by mouth daily.     CARTIA XT 120 MG 24 hr capsule  Generic drug:  diltiazem  TAKE ONE CAPSULE BY MOUTH ONE TIME DAILY IN THE EVENING     carvedilol 25 MG tablet  Commonly known as:  COREG  TAKE ONE TABLET BY MOUTH TWICE DAILY with a meal.     chlorpheniramine-HYDROcodone 10-8 MG/5ML Suer  Commonly known as:  TUSSIONEX  Take 5 mLs by mouth every 6 (six) hours as needed.     Cholecalciferol 5000 UNITS Tabs  Take 1 tablet (5,000 Units total) by mouth daily.     DEXILANT 60 MG capsule  Generic drug:  dexlansoprazole  TAKE ONE CAPSULE BY MOUTH ONE TIME DAILY     estradiol 2 MG tablet  Commonly known as:  ESTRACE  Take 2 mg by mouth at bedtime.     fenofibrate 145 MG tablet  Commonly known as:  TRICOR  TAKE ONE TABLET BY MOUTH ONE TIME DAILY     fluticasone 50 MCG/ACT nasal spray  Commonly known as:  FLONASE  USE TWO SPRAYS IN EACH NOSTRIL DAILY     furosemide 40 MG tablet  Commonly known as:  LASIX  Take one tablet by mouth twice daily     gabapentin 100 MG capsule  Commonly known as:  NEURONTIN  TAKE ONE CAPSULE BY MOUTH THREE TIMES DAILY     glucose blood test strip  Commonly known as:  ONE TOUCH ULTRA TEST  1 each by Other route 3 (three) times daily. Use as instructed     HIBICLENS HAND PUMP GALLON Misc  Shower and wash from neck down except left foot twice a day for 14 days for treatment of MRSA     HYDROcodone-acetaminophen 10-325 MG per tablet  Commonly known as:  NORCO  Take 1-2 tablets by mouth every 4 (four) hours as needed for moderate pain.     insulin aspart 100 UNIT/ML injection  Commonly  known as:  novoLOG  Inject 10-18 Units into the skin 3 (three) times  daily before meals.     Insulin Glargine 300 UNIT/ML Sopn  Commonly known as:  TOUJEO SOLOSTAR  Inject 40 Units into the skin at bedtime.     losartan 100 MG tablet  Commonly known as:  COZAAR  TAKE ONE TABLET BY MOUTH ONE TIME DAILY     metFORMIN 1000 MG tablet  Commonly known as:  GLUCOPHAGE  TAKE ONE TABLET BY MOUTH TWICE DAILY with a meal.     mometasone-formoterol 200-5 MCG/ACT Aero  Commonly known as:  DULERA  Inhale 2 puffs into the lungs 2 (two) times daily.     mupirocin ointment 2 %  Commonly known as:  BACTROBAN  Apply intranasally BID for 14 days     nitroGLYCERIN 0.4 MG SL tablet  Commonly known as:  NITROSTAT  Place 0.4 mg under the tongue every 5 (five) minutes as needed. x3 doses as needed for chest pain.     potassium chloride SA 20 MEQ tablet  Commonly known as:  K-DUR,KLOR-CON  TAKE ONE TABLET BY MOUTH THREE TIMES DAILY     predniSONE 5 MG tablet  Commonly known as:  DELTASONE  Take 1 tablet (5 mg total) by mouth daily with breakfast. Sig 5mg s daily     rifampin 300 MG capsule  Commonly known as:  RIFADIN  Take 1 capsule (300 mg total) by mouth every 12 (twelve) hours.     vancomycin 1,250 mg in sodium chloride 0.9 % 250 mL  Inject 1,250 mg into the vein every 12 (twelve) hours.     venlafaxine XR 75 MG 24 hr capsule  Commonly known as:  EFFEXOR-XR  Take 1 capsule (75 mg total) by mouth 2 (two) times daily.        Diagnostic Studies: No results found.  Disposition: 01-Home or Self Care      Discharge Instructions    Diet - low sodium heart healthy    Complete by:  As directed      Discharge instructions    Complete by:  As directed   Ankle Fracture Care After Refer to this sheet in the next few weeks. These discharge instructions provide you with general information on caring for yourself after you leave the hospital. Your caregiver may also give you specific  instructions. Your treatment has been planned according to the most current medical practices available, but unavoidable complications sometimes occur. If you have any problems or questions after discharge, please call your caregiver. HOME INSTRUCTIONS You may resume a normal diet and activities as directed. Walk with crutches NON WEIGHT BEARING Do NOT get the wound wet.  Wipe with CHG daily and apply new dressing.  Take whole body showers with hibiclens twice a day.  Only take over-the-counter or prescription medicines for pain, discomfort, or fever as directed by your caregiver.  Eat a well-balanced diet.  Avoid lifting or driving until you are instructed otherwise.  Make an appointment to see your caregiver for stitches (suture) or staple removal as directed.   SEEK MEDICAL CARE IF: You have swelling of your calf or leg.  You develop shortness of breath or chest pain.  You have redness, swelling, or increasing pain in the wound.  There is pus or any unusual drainage coming from the surgical site.  You notice a bad smell coming from the surgical site or dressing.  The surgical site breaks open after sutures or staples have been removed.  There is persistent bleeding from the suture or staple line.  You are  getting worse or are not improving.  You have any other questions or concerns.  SEEK IMMEDIATE MEDICAL CARE IF:  You have a fever.  You develop a rash.  You have difficulty breathing.  You develop any reaction or side effects to medicines given.  Your knee motion is decreasing rather than improving.  MAKE SURE YOU:  Understand these instructions.  Will watch your condition.  Will get help right away if you are not doing well or get worse.     Increase activity slowly    Complete by:  As directed            Follow-up Information    Follow up with [facility].   Why:  They will contact you to schedule nurse visits.    Contact information:   [address] [place] [address] [phone]       Follow up with Lorn Junes, MD On 04/07/2015.   Specialty:  Orthopedic Surgery   Why:  appt time 2 pm   Contact information:   [place] [address] [phone]       Follow up with Scharlene Gloss, MD In 3 weeks.   Specialty:  Infectious Diseases   Why:  call Monday to make an appt in 3 weeks   Contact information:   [address] [name] [address] [phone]        Signed: Coletta Hedges 03/29/2015, 9:21 AM

## 2015-03-30 ENCOUNTER — Other Ambulatory Visit: Payer: Self-pay | Admitting: Internal Medicine

## 2015-04-11 ENCOUNTER — Other Ambulatory Visit: Payer: Self-pay | Admitting: *Deleted

## 2015-04-11 MED ORDER — FUROSEMIDE 40 MG PO TABS: 40.0000 mg | ORAL_TABLET | Freq: Two times a day (BID) | ORAL | Status: DC

## 2015-04-15 ENCOUNTER — Telehealth: Payer: Self-pay | Admitting: Internal Medicine

## 2015-04-15 ENCOUNTER — Other Ambulatory Visit: Payer: Self-pay | Admitting: *Deleted

## 2015-04-15 MED ORDER — LOSARTAN POTASSIUM 100 MG PO TABS: 100.0000 mg | ORAL_TABLET | Freq: Every day | ORAL | Status: DC

## 2015-04-15 NOTE — Telephone Encounter (Signed)
See below

## 2015-04-15 NOTE — Telephone Encounter (Signed)
Please call pt at [phone] regarding question about apt due to her being on home IV antoboditics

## 2015-04-16 NOTE — Telephone Encounter (Signed)
Returned pt's call. Pt stated that she has MRSA and has a PIC line with antibiotics until the end of July. Pt wants to know if she should come in or postpone her appt. Advised pt I would speak with Dr Cruzita Lederer. Spoke with Dr Cruzita Lederer. She stated if pt's b/s was under control that we can postpone the appt. Pt stated her b/s sugars have been running 96 to 150. Dr Cruzita Lederer advised ok to postpone appt. Scheduled pt's appt to 8/22.

## 2015-04-22 ENCOUNTER — Telehealth: Payer: Self-pay | Admitting: *Deleted

## 2015-04-22 ENCOUNTER — Encounter: Payer: Self-pay | Admitting: Internal Medicine

## 2015-04-22 ENCOUNTER — Ambulatory Visit (INDEPENDENT_AMBULATORY_CARE_PROVIDER_SITE_OTHER): Payer: BLUE CROSS/BLUE SHIELD | Admitting: Internal Medicine

## 2015-04-22 VITALS — BP 115/64 | HR 89 | Temp 98.0°F | Ht 66.0 in | Wt 267.0 lb

## 2015-04-22 DIAGNOSIS — T847XXD Infection and inflammatory reaction due to other internal orthopedic prosthetic devices, implants and grafts, subsequent encounter: Secondary | ICD-10-CM | POA: Diagnosis not present

## 2015-04-22 NOTE — Telephone Encounter (Signed)
Verbal order per Dr. Linus Salmons given to [facility] at [place] to extend IV antibiotics through 05/13/15 and to add a sed rate and crp to next lab draw. Myrtis Hopping

## 2015-04-22 NOTE — Assessment & Plan Note (Signed)
Doing well so far.  Some nausea may be due to rifampin and can try once a day 600 mg or just stop it.  Will continue and consider stopping at next visit.

## 2015-04-22 NOTE — Progress Notes (Signed)
   Subjective:    Patient ID: [name], female    DOB: 1961/01/22, 54 y.o.   MRN: [id number]  HPI She is here for hsfu.  [name] is a 54 y.o. female with multiple medical problems who initially had a fracture of her 5th metatarsal and developed non union. Underwent ORIF with bone grafting but then fell and broke the 5th metatarsal again. She underwent hardware removal and ORIF again 3 months ago with midfoot reconstruction. The calcaneal osteotomy did not heal well and in May noted draiange, which she describes as white, pus like drainage. Developed swelling of foot and leg. She was started on Septra DS, 2 pills bid with rifampin though did not resolve. Underwent I and D in [place] with associated hardware removal but presented here to Dr. Percell Miller with persistent drainage. She underwent I and D and superficial swab with MRSA and CoNS.  She was started on and has continued with vancomycin and rifampin. Some nausea.  Some headaches relieved with acetaminophen.     Review of Systems  Constitutional: Negative for fever, chills and fatigue.  Gastrointestinal: Positive for nausea. Negative for diarrhea.       Thinks is associated with acid reflux  Skin: Negative for rash.  Neurological: Negative for dizziness and light-headedness.       Objective:   Physical Exam  Constitutional: She appears well-developed and well-nourished. No distress.  Eyes: No scleral icterus.  Cardiovascular: Normal rate, regular rhythm and normal heart sounds.   No murmur heard. Pulmonary/Chest: Effort normal and breath sounds normal. No respiratory distress. She has no wheezes.  Musculoskeletal:  picc with small amount of erythema surrounding it   Skin: No rash noted.          Assessment & Plan:

## 2015-04-23 ENCOUNTER — Telehealth: Payer: Self-pay | Admitting: Internal Medicine

## 2015-04-23 ENCOUNTER — Telehealth: Payer: Self-pay | Admitting: *Deleted

## 2015-04-23 NOTE — Telephone Encounter (Signed)
A user error has taken place.

## 2015-04-23 NOTE — Telephone Encounter (Signed)
Patient called c/o nausea and zofran not helping. This was prescribed by her podiatrist and advised her to call him and request a new medication. Patient agreed to this.

## 2015-04-24 ENCOUNTER — Other Ambulatory Visit: Payer: Self-pay | Admitting: Cardiology

## 2015-04-24 NOTE — Telephone Encounter (Signed)
Rx(s) sent to pharmacy electronically.  

## 2015-04-25 ENCOUNTER — Ambulatory Visit: Payer: Medicare Other | Admitting: Internal Medicine

## 2015-04-27 ENCOUNTER — Encounter (HOSPITAL_COMMUNITY): Payer: Self-pay

## 2015-04-27 ENCOUNTER — Inpatient Hospital Stay (HOSPITAL_COMMUNITY): Payer: BLUE CROSS/BLUE SHIELD

## 2015-04-27 ENCOUNTER — Inpatient Hospital Stay (HOSPITAL_COMMUNITY)
Admission: EM | Admit: 2015-04-27 | Discharge: 2015-04-29 | DRG: 292 | Disposition: A | Discharge status: Home / Self Care | Payer: BLUE CROSS/BLUE SHIELD | Attending: Internal Medicine | Admitting: Internal Medicine

## 2015-04-27 ENCOUNTER — Emergency Department (HOSPITAL_COMMUNITY): Payer: BLUE CROSS/BLUE SHIELD

## 2015-04-27 DIAGNOSIS — R6889 Other general symptoms and signs: Secondary | ICD-10-CM | POA: Diagnosis present

## 2015-04-27 DIAGNOSIS — N183 Chronic kidney disease, stage 3 unspecified: Secondary | ICD-10-CM

## 2015-04-27 DIAGNOSIS — T380X5A Adverse effect of glucocorticoids and synthetic analogues, initial encounter: Secondary | ICD-10-CM | POA: Diagnosis present

## 2015-04-27 DIAGNOSIS — N179 Acute kidney failure, unspecified: Secondary | ICD-10-CM | POA: Diagnosis present

## 2015-04-27 DIAGNOSIS — Z9851 Tubal ligation status: Secondary | ICD-10-CM | POA: Diagnosis not present

## 2015-04-27 DIAGNOSIS — Z7982 Long term (current) use of aspirin: Secondary | ICD-10-CM | POA: Diagnosis not present

## 2015-04-27 DIAGNOSIS — I5033 Acute on chronic diastolic (congestive) heart failure: Principal | ICD-10-CM | POA: Diagnosis present

## 2015-04-27 DIAGNOSIS — R0602 Shortness of breath: Secondary | ICD-10-CM | POA: Diagnosis not present

## 2015-04-27 DIAGNOSIS — N289 Disorder of kidney and ureter, unspecified: Secondary | ICD-10-CM | POA: Diagnosis not present

## 2015-04-27 DIAGNOSIS — F329 Major depressive disorder, single episode, unspecified: Secondary | ICD-10-CM | POA: Diagnosis present

## 2015-04-27 DIAGNOSIS — I10 Essential (primary) hypertension: Secondary | ICD-10-CM | POA: Diagnosis present

## 2015-04-27 DIAGNOSIS — Z959 Presence of cardiac and vascular implant and graft, unspecified: Secondary | ICD-10-CM

## 2015-04-27 DIAGNOSIS — R05 Cough: Secondary | ICD-10-CM

## 2015-04-27 DIAGNOSIS — L299 Pruritus, unspecified: Secondary | ICD-10-CM

## 2015-04-27 DIAGNOSIS — E119 Type 2 diabetes mellitus without complications: Secondary | ICD-10-CM | POA: Diagnosis present

## 2015-04-27 DIAGNOSIS — R0600 Dyspnea, unspecified: Secondary | ICD-10-CM | POA: Diagnosis not present

## 2015-04-27 DIAGNOSIS — D869 Sarcoidosis, unspecified: Secondary | ICD-10-CM | POA: Diagnosis present

## 2015-04-27 DIAGNOSIS — Z7952 Long term (current) use of systemic steroids: Secondary | ICD-10-CM | POA: Diagnosis not present

## 2015-04-27 DIAGNOSIS — D539 Nutritional anemia, unspecified: Secondary | ICD-10-CM | POA: Diagnosis present

## 2015-04-27 DIAGNOSIS — J449 Chronic obstructive pulmonary disease, unspecified: Secondary | ICD-10-CM | POA: Diagnosis present

## 2015-04-27 DIAGNOSIS — K219 Gastro-esophageal reflux disease without esophagitis: Secondary | ICD-10-CM | POA: Diagnosis present

## 2015-04-27 DIAGNOSIS — G4730 Sleep apnea, unspecified: Secondary | ICD-10-CM | POA: Diagnosis present

## 2015-04-27 DIAGNOSIS — Z794 Long term (current) use of insulin: Secondary | ICD-10-CM | POA: Diagnosis not present

## 2015-04-27 DIAGNOSIS — Z90710 Acquired absence of both cervix and uterus: Secondary | ICD-10-CM

## 2015-04-27 DIAGNOSIS — Z881 Allergy status to other antibiotic agents status: Secondary | ICD-10-CM | POA: Diagnosis not present

## 2015-04-27 DIAGNOSIS — I5032 Chronic diastolic (congestive) heart failure: Secondary | ICD-10-CM | POA: Diagnosis present

## 2015-04-27 DIAGNOSIS — D649 Anemia, unspecified: Secondary | ICD-10-CM | POA: Diagnosis present

## 2015-04-27 DIAGNOSIS — Z6841 Body Mass Index (BMI) 40.0 and over, adult: Secondary | ICD-10-CM

## 2015-04-27 DIAGNOSIS — R21 Rash and other nonspecific skin eruption: Secondary | ICD-10-CM | POA: Diagnosis present

## 2015-04-27 DIAGNOSIS — Z9049 Acquired absence of other specified parts of digestive tract: Secondary | ICD-10-CM | POA: Diagnosis present

## 2015-04-27 DIAGNOSIS — M868X7 Other osteomyelitis, ankle and foot: Secondary | ICD-10-CM | POA: Diagnosis not present

## 2015-04-27 DIAGNOSIS — R0609 Other forms of dyspnea: Secondary | ICD-10-CM | POA: Diagnosis not present

## 2015-04-27 DIAGNOSIS — R059 Cough, unspecified: Secondary | ICD-10-CM

## 2015-04-27 DIAGNOSIS — L089 Local infection of the skin and subcutaneous tissue, unspecified: Secondary | ICD-10-CM | POA: Diagnosis present

## 2015-04-27 DIAGNOSIS — E785 Hyperlipidemia, unspecified: Secondary | ICD-10-CM | POA: Diagnosis present

## 2015-04-27 DIAGNOSIS — E1140 Type 2 diabetes mellitus with diabetic neuropathy, unspecified: Secondary | ICD-10-CM | POA: Diagnosis not present

## 2015-04-27 DIAGNOSIS — E669 Obesity, unspecified: Secondary | ICD-10-CM | POA: Diagnosis present

## 2015-04-27 DIAGNOSIS — N1832 Chronic kidney disease, stage 3b: Secondary | ICD-10-CM

## 2015-04-27 DIAGNOSIS — E860 Dehydration: Secondary | ICD-10-CM

## 2015-04-27 DIAGNOSIS — R8271 Bacteriuria: Secondary | ICD-10-CM

## 2015-04-27 DIAGNOSIS — M109 Gout, unspecified: Secondary | ICD-10-CM | POA: Diagnosis present

## 2015-04-27 DIAGNOSIS — D860 Sarcoidosis of lung: Secondary | ICD-10-CM | POA: Diagnosis present

## 2015-04-27 DIAGNOSIS — R509 Fever, unspecified: Secondary | ICD-10-CM

## 2015-04-27 DIAGNOSIS — B9689 Other specified bacterial agents as the cause of diseases classified elsewhere: Secondary | ICD-10-CM | POA: Diagnosis not present

## 2015-04-27 DIAGNOSIS — E1149 Type 2 diabetes mellitus with other diabetic neurological complication: Secondary | ICD-10-CM | POA: Diagnosis present

## 2015-04-27 LAB — BASIC METABOLIC PANEL
Anion gap: 12 (ref 5–15)
BUN: 13 mg/dL (ref 6–20)
CO2: 24 mmol/L (ref 22–32)
Calcium: 8.5 mg/dL — ABNORMAL LOW (ref 8.9–10.3)
Chloride: 101 mmol/L (ref 101–111)
Creatinine, Ser: 1.87 mg/dL — ABNORMAL HIGH (ref 0.44–1.00)
GFR calc Af Amer: 34 mL/min — ABNORMAL LOW (ref >60)
GFR calc non Af Amer: 29 mL/min — ABNORMAL LOW (ref >60)
Glucose, Bld: 166 mg/dL — ABNORMAL HIGH (ref 65–99)
Potassium: 3.5 mmol/L (ref 3.5–5.1)
Sodium: 137 mmol/L (ref 135–145)

## 2015-04-27 LAB — I-STAT CG4 LACTIC ACID, ED: Lactic Acid, Venous: 2.23 mmol/L (ref 0.5–2.0)

## 2015-04-27 LAB — URINALYSIS, ROUTINE W REFLEX MICROSCOPIC
Bilirubin Urine: NEGATIVE
Glucose, UA: NEGATIVE mg/dL
Hgb urine dipstick: NEGATIVE
Ketones, ur: NEGATIVE mg/dL
Nitrite: NEGATIVE
Protein, ur: NEGATIVE mg/dL
Specific Gravity, Urine: 1.008 (ref 1.005–1.030)
Urobilinogen, UA: 0.2 mg/dL (ref 0.0–1.0)
pH: 5 (ref 5.0–8.0)

## 2015-04-27 LAB — CBC
HCT: 27.2 % — ABNORMAL LOW (ref 36.0–46.0)
HCT: 27.4 % — ABNORMAL LOW (ref 36.0–46.0)
Hemoglobin: 8.4 g/dL — ABNORMAL LOW (ref 12.0–15.0)
Hemoglobin: 8.7 g/dL — ABNORMAL LOW (ref 12.0–15.0)
MCH: 27.1 pg (ref 26.0–34.0)
MCH: 27.6 pg (ref 26.0–34.0)
MCHC: 30.9 g/dL (ref 30.0–36.0)
MCHC: 31.8 g/dL (ref 30.0–36.0)
MCV: 87.7 fL (ref 78.0–100.0)
MCV: 87 fL (ref 78.0–100.0)
Platelets: 326 10*3/uL (ref 150–400)
Platelets: 335 10*3/uL (ref 150–400)
RBC: 3.1 MIL/uL — ABNORMAL LOW (ref 3.87–5.11)
RBC: 3.15 MIL/uL — ABNORMAL LOW (ref 3.87–5.11)
RDW: 15.4 % (ref 11.5–15.5)
RDW: 15.2 % (ref 11.5–15.5)
WBC: 9.4 10*3/uL (ref 4.0–10.5)
WBC: 8.9 10*3/uL (ref 4.0–10.5)

## 2015-04-27 LAB — LACTIC ACID, PLASMA
Lactic Acid, Venous: 1.7 mmol/L (ref 0.5–2.0)
Lactic Acid, Venous: 1.2 mmol/L (ref 0.5–2.0)

## 2015-04-27 LAB — GLUCOSE, CAPILLARY
Glucose-Capillary: 143 mg/dL — ABNORMAL HIGH (ref 65–99)
Glucose-Capillary: 269 mg/dL — ABNORMAL HIGH (ref 65–99)

## 2015-04-27 LAB — OCCULT BLOOD X 1 CARD TO LAB, STOOL: Fecal Occult Bld: NEGATIVE

## 2015-04-27 LAB — URINE MICROSCOPIC-ADD ON

## 2015-04-27 LAB — CREATININE, SERUM
Creatinine, Ser: 1.84 mg/dL — ABNORMAL HIGH (ref 0.44–1.00)
GFR calc Af Amer: 35 mL/min — ABNORMAL LOW (ref >60)
GFR calc non Af Amer: 30 mL/min — ABNORMAL LOW (ref >60)

## 2015-04-27 LAB — TROPONIN I: Troponin I: <0.03 ng/mL (ref <0.031)

## 2015-04-27 MED ORDER — GABAPENTIN 100 MG PO CAPS
100.0000 mg | ORAL_CAPSULE | Freq: Three times a day (TID) | ORAL | Status: DC
  Administered 2015-04-27 – 2015-04-29 (×6): 100 mg via ORAL
  Filled 2015-04-27 (×8): qty 1

## 2015-04-27 MED ORDER — NITROGLYCERIN 0.4 MG SL SUBL: 0.4000 mg | SUBLINGUAL_TABLET | SUBLINGUAL | Status: DC | PRN

## 2015-04-27 MED ORDER — INSULIN ASPART 100 UNIT/ML ~~LOC~~ SOLN: 10.0000 [IU] | Freq: Three times a day (TID) | SUBCUTANEOUS | Status: DC

## 2015-04-27 MED ORDER — ESTRADIOL 2 MG PO TABS
2.0000 mg | ORAL_TABLET | Freq: Every day | ORAL | Status: DC
  Administered 2015-04-27 – 2015-04-28 (×2): 2 mg via ORAL
  Filled 2015-04-27 (×3): qty 1

## 2015-04-27 MED ORDER — ALLOPURINOL 100 MG PO TABS
100.0000 mg | ORAL_TABLET | Freq: Every day | ORAL | Status: DC
  Administered 2015-04-28 – 2015-04-29 (×2): 100 mg via ORAL
  Filled 2015-04-27 (×2): qty 1

## 2015-04-27 MED ORDER — DIPHENHYDRAMINE HCL 25 MG PO CAPS
25.0000 mg | ORAL_CAPSULE | Freq: Four times a day (QID) | ORAL | Status: DC | PRN
  Administered 2015-04-27 – 2015-04-28 (×3): 25 mg via ORAL
  Filled 2015-04-27 (×3): qty 1

## 2015-04-27 MED ORDER — HYDROCODONE-ACETAMINOPHEN 10-325 MG PO TABS
1.0000 | ORAL_TABLET | ORAL | Status: DC | PRN
  Administered 2015-04-29: 1 via ORAL
  Filled 2015-04-27: qty 1
  Filled 2015-04-27: qty 2

## 2015-04-27 MED ORDER — ALBUTEROL SULFATE (2.5 MG/3ML) 0.083% IN NEBU
5.0000 mg | INHALATION_SOLUTION | Freq: Once | RESPIRATORY_TRACT | Status: AC
  Administered 2015-04-27: 5 mg via RESPIRATORY_TRACT
  Filled 2015-04-27: qty 6

## 2015-04-27 MED ORDER — METHYLPREDNISOLONE SODIUM SUCC 125 MG IJ SOLR
60.0000 mg | INTRAMUSCULAR | Status: DC
  Administered 2015-04-27 – 2015-04-28 (×2): 60 mg via INTRAVENOUS
  Filled 2015-04-27 (×2): qty 0.96
  Filled 2015-04-27: qty 2

## 2015-04-27 MED ORDER — ATORVASTATIN CALCIUM 40 MG PO TABS
40.0000 mg | ORAL_TABLET | Freq: Every day | ORAL | Status: DC
  Administered 2015-04-27 – 2015-04-28 (×2): 40 mg via ORAL
  Filled 2015-04-27 (×3): qty 1

## 2015-04-27 MED ORDER — FUROSEMIDE 10 MG/ML IJ SOLN
80.0000 mg | Freq: Once | INTRAMUSCULAR | Status: AC
  Administered 2015-04-27: 80 mg via INTRAVENOUS
  Filled 2015-04-27: qty 8

## 2015-04-27 MED ORDER — CARVEDILOL 12.5 MG PO TABS
12.5000 mg | ORAL_TABLET | Freq: Two times a day (BID) | ORAL | Status: DC
  Administered 2015-04-27 – 2015-04-29 (×4): 12.5 mg via ORAL
  Filled 2015-04-27 (×6): qty 1

## 2015-04-27 MED ORDER — PANTOPRAZOLE SODIUM 40 MG PO TBEC
40.0000 mg | DELAYED_RELEASE_TABLET | Freq: Every day | ORAL | Status: DC
  Administered 2015-04-28 – 2015-04-29 (×2): 40 mg via ORAL
  Filled 2015-04-27 (×2): qty 1

## 2015-04-27 MED ORDER — CEFTRIAXONE SODIUM IN DEXTROSE 20 MG/ML IV SOLN
1.0000 g | INTRAVENOUS | Status: DC
  Administered 2015-04-27: 1 g via INTRAVENOUS
  Filled 2015-04-27 (×2): qty 50

## 2015-04-27 MED ORDER — SODIUM CHLORIDE 0.9 % IV SOLN: INTRAVENOUS | Status: DC

## 2015-04-27 MED ORDER — ENOXAPARIN SODIUM 40 MG/0.4ML ~~LOC~~ SOLN
40.0000 mg | SUBCUTANEOUS | Status: DC
  Administered 2015-04-27 – 2015-04-28 (×2): 40 mg via SUBCUTANEOUS
  Filled 2015-04-27 (×4): qty 0.4

## 2015-04-27 MED ORDER — IPRATROPIUM-ALBUTEROL 0.5-2.5 (3) MG/3ML IN SOLN
3.0000 mL | Freq: Four times a day (QID) | RESPIRATORY_TRACT | Status: DC
  Administered 2015-04-27: 3 mL via RESPIRATORY_TRACT
  Filled 2015-04-27: qty 3

## 2015-04-27 MED ORDER — IPRATROPIUM-ALBUTEROL 0.5-2.5 (3) MG/3ML IN SOLN: 3.0000 mL | Freq: Four times a day (QID) | RESPIRATORY_TRACT | Status: DC | PRN

## 2015-04-27 MED ORDER — VENLAFAXINE HCL ER 75 MG PO CP24
75.0000 mg | ORAL_CAPSULE | Freq: Two times a day (BID) | ORAL | Status: DC
  Administered 2015-04-27 – 2015-04-29 (×4): 75 mg via ORAL
  Filled 2015-04-27 (×5): qty 1

## 2015-04-27 MED ORDER — SODIUM CHLORIDE 0.9 % IV BOLUS (SEPSIS): 1000.0000 mL | Freq: Once | INTRAVENOUS | Status: DC

## 2015-04-27 MED ORDER — INSULIN ASPART 100 UNIT/ML ~~LOC~~ SOLN
0.0000 [IU] | Freq: Three times a day (TID) | SUBCUTANEOUS | Status: DC
  Administered 2015-04-27: 2 [IU] via SUBCUTANEOUS
  Administered 2015-04-28: 11 [IU] via SUBCUTANEOUS
  Administered 2015-04-28: 15 [IU] via SUBCUTANEOUS

## 2015-04-27 MED ORDER — PAROXETINE MESYLATE 7.5 MG PO CAPS: 7.5000 mg | ORAL_CAPSULE | Freq: Every day | ORAL | Status: DC

## 2015-04-27 MED ORDER — SODIUM CHLORIDE 0.9 % IV BOLUS (SEPSIS)
500.0000 mL | Freq: Once | INTRAVENOUS | Status: AC
  Administered 2015-04-27: 1000 mL via INTRAVENOUS

## 2015-04-27 MED ORDER — DEXTROSE 5 % IV SOLN: 1.0000 g | INTRAVENOUS | Status: DC

## 2015-04-27 MED ORDER — INSULIN GLARGINE 100 UNIT/ML ~~LOC~~ SOLN
40.0000 [IU] | Freq: Every day | SUBCUTANEOUS | Status: DC
  Administered 2015-04-27: 40 [IU] via SUBCUTANEOUS
  Filled 2015-04-27 (×2): qty 0.4

## 2015-04-27 MED ORDER — BUPROPION HCL ER (XL) 300 MG PO TB24
300.0000 mg | ORAL_TABLET | Freq: Every day | ORAL | Status: DC
  Administered 2015-04-28 – 2015-04-29 (×2): 300 mg via ORAL
  Filled 2015-04-27 (×2): qty 1

## 2015-04-27 MED ORDER — PIPERACILLIN-TAZOBACTAM 3.375 G IVPB 30 MIN: 3.3750 g | Freq: Once | INTRAVENOUS | Status: DC

## 2015-04-27 MED ORDER — PAROXETINE HCL 10 MG/5ML PO SUSP
7.5000 mg | Freq: Every day | ORAL | Status: DC
  Filled 2015-04-27 (×3): qty 3.8

## 2015-04-27 MED ORDER — FLUTICASONE PROPIONATE 50 MCG/ACT NA SUSP
2.0000 | Freq: Every day | NASAL | Status: DC
  Administered 2015-04-28 – 2015-04-29 (×2): 2 via NASAL
  Filled 2015-04-27: qty 16

## 2015-04-27 MED ORDER — ASPIRIN EC 81 MG PO TBEC
81.0000 mg | DELAYED_RELEASE_TABLET | Freq: Every day | ORAL | Status: DC
  Administered 2015-04-28 – 2015-04-29 (×2): 81 mg via ORAL
  Filled 2015-04-27 (×2): qty 1

## 2015-04-27 MED ORDER — DILTIAZEM HCL ER COATED BEADS 120 MG PO CP24
120.0000 mg | ORAL_CAPSULE | Freq: Every day | ORAL | Status: DC
  Administered 2015-04-27 – 2015-04-28 (×2): 120 mg via ORAL
  Filled 2015-04-27 (×3): qty 1

## 2015-04-27 MED ORDER — MOMETASONE FURO-FORMOTEROL FUM 200-5 MCG/ACT IN AERO
2.0000 | INHALATION_SPRAY | Freq: Two times a day (BID) | RESPIRATORY_TRACT | Status: DC
  Administered 2015-04-27 – 2015-04-29 (×4): 2 via RESPIRATORY_TRACT
  Filled 2015-04-27: qty 8.8

## 2015-04-27 NOTE — ED Notes (Signed)
Pt reports 2 days shortness of breath, chills and non productive cough.  Pt has PICC line, MRSA, left ankle infection.  Pt talking in complete sentences.  No one in household sick.  [facility]N came this morning to change PICC dressing and referred pt to ED.

## 2015-04-27 NOTE — ED Notes (Signed)
Report called tyo 5 W

## 2015-04-27 NOTE — Consult Note (Signed)
[facility] for Infectious Disease    Date of Admission:  04/27/2015           Week 4 vancomycin        Day 1 ceftriaxone       Reason for Consult: New onset of dyspnea on exertion    Referring Physician: Dr. Florencia Reasons  Principal Problem:   Dyspnea on exertion Active Problems:   Acute renal insufficiency   Rash   Rigors   Left foot infection   GERD   Anemia   Chronic diastolic heart failure   SOB (shortness of breath)   . allopurinol  100 mg Oral Daily  . aspirin EC  81 mg Oral Daily  . atorvastatin  40 mg Oral q1800  . buPROPion  300 mg Oral Daily  . carvedilol  12.5 mg Oral BID WC  . cefTRIAXone (ROCEPHIN)  IV  1 g Intravenous Q24H  . diltiazem  120 mg Oral QHS  . enoxaparin (LOVENOX) injection  40 mg Subcutaneous Q24H  . estradiol  2 mg Oral QHS  . fluticasone  2 spray Each Nare Daily  . gabapentin  100 mg Oral TID  . insulin aspart  0-15 Units Subcutaneous TID WC  . insulin glargine  40 Units Subcutaneous QHS  . ipratropium-albuterol  3 mL Nebulization Q6H  . methylPREDNISolone (SOLU-MEDROL) injection  60 mg Intravenous Q24H  . mometasone-formoterol  2 puff Inhalation BID  . pantoprazole  40 mg Oral Daily  . PARoxetine Mesylate  7.5 mg Oral Daily  . venlafaxine XR  75 mg Oral BID    Recommendations: 1. Continue ceftriaxone pending blood culture results 2. Stop vancomycin 3. Monitor renal function closely 4. Diphenhydramine as needed   Assessment: I am not absolutely certain what is causing [name]'s acute symptoms and suspect that there may be more than one new thing going on. Her new pruritic rash suggests that she may be developing an allergic reaction to vancomycin. Since her vancomycin level is supratherapeutic and she has developed acute renal insufficiency it will need to be stopped anyway. I would treat her with diphenhydramine and see if the rash improves over the next week.  With rigors I am concerned that she may be having fever.  This could be related to an allergic reaction to vancomycin but she could also have a PICC infection or less likely pneumonia. I do not think that her foot infection is causing any new problems.  Her dyspnea on exertion may be related to weight gain and an exacerbation of heart failure or.  I agree with continued ceftriaxone coverage in case she has a gram-negative rod PICC line infection.   HPI: [name] is a 54 y.o. female who has been receiving IV vancomycin for the past 4 weeks for a left foot infection complicating fifth metatarsal fracture. She was seen on 04/22/2015 by my partner, Dr. Talbot Grumbling. She states that her left foot is doing much better and her sutures have been removed. She came to the emergency department today because she has developed progressive dyspnea on exertion over the past 4 days. She has a history of pulmonary sarcoidosis and heart failure. She's also had a dry cough. She states that this reminds her of when she had previous bouts of heart failure rather than her pulmonary sarcoidosis. She notes that she has gained 5 pounds in the past few days.  She has felt somewhat hot and has had  a few episodes of hard shaking chills. Her highest recorded temperature at home was 99. She just recently began to develop a generalized, paretic rash. She has not had any obvious problems with her PICC. She's not had any nausea, vomiting or diarrhea. She denies dysuria. [facility] just [facility] letting her know that her vancomycin trough was over 30 and that her creatinine had gone up.   Review of Systems: Constitutional: positive for anorexia and chills, negative for fevers, sweats and weight loss Eyes: negative Ears, nose, mouth, throat, and face: negative Respiratory: positive for cough and dyspnea on exertion, negative for sputum and wheezing Cardiovascular: negative Gastrointestinal: negative Genitourinary:negative Integument/breast: positive for  pruritus and rash  Past Medical History  Diagnosis Date  . DIABETES MELLITUS, TYPE II 08/21/2006  . HYPERLIPIDEMIA 08/21/2006  . GOUT 08/20/2010  . OBESITY 06/04/2009  . ANEMIA-UNSPECIFIED 09/18/2009  . HYPERTENSION 08/21/2006  . GERD 08/21/2006  . SLEEP APNEA 04/21/2009    last testing was negative  . Internal hemorrhoids   . Pulmonary sarcoidosis     Followed locally by pulmonology, but also by Dr. Casper Harrison at [facility]  . Exertional chest pain     sharp, substernal, exertional  . Vocal cord dysfunction   . Chronic diastolic heart failure, NYHA class 2     LVEDP roughly 20% by cath  . CHF (congestive heart failure)   . COPD (chronic obstructive pulmonary disease)   . Depression   . Fracture of 5th metatarsal     non union  . Abnormal SPEP 04/17/2014    History  Substance Use Topics  . Smoking status: Never Smoker   . Smokeless tobacco: Never Used  . Alcohol Use: No    Family History  Problem Relation Age of Onset  . Diabetes Father   . Heart attack Father   . Coronary artery disease Father   . Heart failure Father   . COPD Mother   . Emphysema Mother   . Asthma Mother   . Heart failure Mother   . Sarcoidosis Maternal Uncle   . Colon cancer Neg Hx   . Lung cancer Brother   . Cancer Brother   . Diabetes Brother   . Heart attack Maternal Grandfather    Allergies  Allergen Reactions  . Lisinopril Cough  . Methotrexate Other (See Comments)     peri-oral and buccal lesions.  . Clindamycin/Lincomycin Nausea And Vomiting and Rash    OBJECTIVE: Blood pressure 141/69, pulse 86, temperature 99.6 F (37.6 C), temperature source Oral, resp. rate 20, height 5\' 6"  (1.676 m), weight 271 lb 1.6 oz (122.97 kg), SpO2 98 %. General: her weight is up 5 pounds to 272 Skin: diffuse, erythematous maculopapular rash. PICC site appears normal Lungs: clear Cor: regular S1 and S2 with no murmurs Abdomen: obese, soft and nontender Extremities: Left foot incision  healing well without evidence of active infection  Lab Results Lab Results  Component Value Date   WBC 9.4 04/27/2015   HGB 8.4* 04/27/2015   HCT 27.2* 04/27/2015   MCV 87.7 04/27/2015   PLT 326 04/27/2015    Lab Results  Component Value Date   CREATININE 1.87* 04/27/2015   BUN 13 04/27/2015   NA 137 04/27/2015   K 3.5 04/27/2015   CL 101 04/27/2015   CO2 24 04/27/2015    Lab Results  Component Value Date   ALT 14 03/29/2015   AST 23 03/29/2015   ALKPHOS 89 03/29/2015  BILITOT 0.7 03/29/2015     Microbiology: Recent Results (from the past 240 hour(s))  Blood culture (routine x 2)     Status: None (Preliminary result)   Collection Time: 04/27/15 12:21 PM  Result Value Ref Range Status   Specimen Description BLOOD LEFT ANTECUBITAL  Final   Special Requests BOTTLES DRAWN AEROBIC AND ANAEROBIC 5CC  Final   Culture PENDING  Incomplete   Report Status PENDING  Incomplete    Michel Bickers, MD [facility] for Infectious [facility] [phone] pager   [phone] cell 04/27/2015, 4:32 PM

## 2015-04-27 NOTE — Progress Notes (Signed)
[name] [id number] Admission Data: 04/27/2015 4:45 PM Attending Provider: Florencia Reasons, MD  UKG:[id number], [name] Main, MD Consults/ Treatment Team:    [name] is a 54 y.o. female patient admitted from ED awake, alert  & orientated  X 3,  Full Code, VSS - Blood pressure 141/69, pulse 86, temperature 99.6 F (37.6 C), temperature source Oral, resp. rate 20, height 5\' 6"  (1.676 m), weight 122.97 kg (271 lb 1.6 oz), SpO2 98 %. shortness of breath on exertion, no c/o chest pain, no distress noted. Tele # 6 placed and pt is currently running:normal sinus rhythm.   IV site WDL:  upper arm right, condition patent and no redness with a transparent dsg that's clean dry and intact.  Allergies:   Allergies  Allergen Reactions  . Lisinopril Cough  . Methotrexate Other (See Comments)     peri-oral and buccal lesions.  . Clindamycin/Lincomycin Nausea And Vomiting and Rash     Past Medical History  Diagnosis Date  . DIABETES MELLITUS, TYPE II 08/21/2006  . HYPERLIPIDEMIA 08/21/2006  . GOUT 08/20/2010  . OBESITY 06/04/2009  . ANEMIA-UNSPECIFIED 09/18/2009  . HYPERTENSION 08/21/2006  . GERD 08/21/2006  . SLEEP APNEA 04/21/2009    last testing was negative  . Internal hemorrhoids   . Pulmonary sarcoidosis     Followed locally by pulmonology, but also by Dr. Casper Harrison at [facility]  . Exertional chest pain     sharp, substernal, exertional  . Vocal cord dysfunction   . Chronic diastolic heart failure, NYHA class 2     LVEDP roughly 20% by cath  . CHF (congestive heart failure)   . COPD (chronic obstructive pulmonary disease)   . Depression   . Fracture of 5th metatarsal     non union  . Abnormal SPEP 04/17/2014    History:  obtained from the patient. Tobacco/alcohol: denied none  Pt orientation to unit, room and routine. Information packet given to patient/family and safety video watched.  Admission INP armband ID verified with patient/family, and in place. SR up x 2, fall  risk assessment complete with Patient and family verbalizing understanding of risks associated with falls. Pt verbalizes an understanding of how to use the call bell and to call for help before getting out of bed.  Skin, clean-dry- intact without evidence of bruising, or skin tears.   No evidence of skin break down noted on exam. Rash scattered, Dr. Megan Salon aware.    Will cont to monitor and assist as needed.  [name] Kilmer Margaretha Sheffield, RN 04/27/2015 4:45 PM

## 2015-04-27 NOTE — Progress Notes (Signed)
Called Ophelia Charter RN in ED for report.

## 2015-04-27 NOTE — ED Provider Notes (Addendum)
CSN: [id number]     Arrival date & time 04/27/15  1122 History   First MD Initiated Contact with Patient 04/27/15 1144     Chief Complaint  Patient presents with  . Shortness of Breath  . Cough     (Consider location/radiation/quality/duration/timing/severity/associated sxs/prior Treatment) Patient is a 54 y.o. female presenting with shortness of breath and cough. The history is provided by the patient and the spouse.  Shortness of Breath Associated symptoms: cough and rash   Associated symptoms: no abdominal pain, no chest pain, no diaphoresis, no headaches, no neck pain, no sore throat and no vomiting   Cough Associated symptoms: chills, rash and shortness of breath   Associated symptoms: no chest pain, no diaphoresis, no eye discharge, no headaches, no rhinorrhea and no sore throat   Patient w hx iddm, sarcoidosis, copd, chf, htn, c/o subjective fever at home the past couple days, w tmax at home 99. +chills. No sweats. States decreased appetite in past week w nausea, no vomiting. No abdominal pain. Having normal bms, including today, no diarrhea. Is on week 3 of iv abx tx via picc line for left foot/metatarsal infection - states wound/skin is healed, and doing much better, no drainage, redness, or pain to area. Pt has noted increased non prod cough and mild sob. No chest pain or discomfort. Denies sore throat or other uri c/o. No headaches. Denies dysuria or gu c/o. In past week, mildly itchy, erythematous, blotchy rash to trunk and lower ext.  No new meds during this period. No change in home/personal products. No hx similar rash.      Past Medical History  Diagnosis Date  . DIABETES MELLITUS, TYPE II 08/21/2006  . HYPERLIPIDEMIA 08/21/2006  . GOUT 08/20/2010  . OBESITY 06/04/2009  . ANEMIA-UNSPECIFIED 09/18/2009  . HYPERTENSION 08/21/2006  . GERD 08/21/2006  . SLEEP APNEA 04/21/2009    last testing was negative  . Internal hemorrhoids   . Pulmonary sarcoidosis     Followed  locally by pulmonology, but also by Dr. Casper Harrison at [facility]  . Exertional chest pain     sharp, substernal, exertional  . Vocal cord dysfunction   . Chronic diastolic heart failure, NYHA class 2     LVEDP roughly 20% by cath  . CHF (congestive heart failure)   . COPD (chronic obstructive pulmonary disease)   . Depression   . Fracture of 5th metatarsal     non union  . Abnormal SPEP 04/17/2014   Past Surgical History  Procedure Laterality Date  . Ventral hernia repair    . Nissen fundoplication  1443  . Cholecystectomy  1984  . Abdominal hysterectomy    . Knee arthroscopy      right  . Tubal ligation      with reversal in 1994  . Bladder suspension  11/11/2011    Procedure: TRANSVAGINAL TAPE (TVT) PROCEDURE;  Surgeon: Olga Millers, MD;  Location: [facility];  Service: Gynecology;  Laterality: N/A;  . Cystoscopy  11/11/2011    Procedure: CYSTOSCOPY;  Surgeon: Olga Millers, MD;  Location: [facility];  Service: Gynecology;  Laterality: N/A;  . Doppler echocardiography  02/12/2013    LV FUNCTION, SIZE NORMAL; MILD CONCENTRIC LVH; EST EF 55-65%; WALL MOTION NORMAL  . Lexiscan myoview  03/09/2013    EF 50%; NORMAL MYOCARDIAL PERFUSION STUDY - breast attenuation  . Cardiac catheterization  07/2010    LVEF 50-55% WITH VERY MILD GLOBAL HYPOKINESIA; ESSENTIALLY NORMAL CORONARY ARTERIES; NORMAL LV FUNCTION  .  Carotids  02/18/11    CAROTID DUPLEX; VERTEBRALS ARE PATENT WITH ANTEGRADE FLOW. ICA/CCA RATIO 1.61 ON RIGHT AND 0.75 ON LEFT  . Right and left cardiac catheterization  04/23/2013    Angiographic normal coronaries; LVEDP 20 mmHg, PCWP 12-14 mmHg, RAP 12 mmHg.; Fick CO/CI 4.9/2.2  . Metatarsal osteotomy with open reduction internal fixation (orif) metatarsal with fusion Left 04/09/2014    Procedure: LEFT FOOT FRACTURE OPEN TREATMENT METATARSAL INCLUDES INTERNAL FIXATION EACH;  Surgeon: Lorn Junes, MD;  Location: [place];  Service: Orthopedics;  Laterality:  Left;  . Left and right heart catheterization with coronary angiogram N/A 04/23/2013    Procedure: LEFT AND RIGHT HEART CATHETERIZATION WITH CORONARY ANGIOGRAM;  Surgeon: Leonie Man, MD;  Location: [facility] CATH [facility];  Service: Cardiovascular;  Laterality: N/A;   Family History  Problem Relation Age of Onset  . Diabetes Father   . Heart attack Father   . Coronary artery disease Father   . Heart failure Father   . COPD Mother   . Emphysema Mother   . Asthma Mother   . Heart failure Mother   . Sarcoidosis Maternal Uncle   . Colon cancer Neg Hx   . Lung cancer Brother   . Cancer Brother   . Diabetes Brother   . Heart attack Maternal Grandfather    History  Substance Use Topics  . Smoking status: Never Smoker   . Smokeless tobacco: Never Used  . Alcohol Use: No   OB History    No data available     Review of Systems  Constitutional: Positive for chills. Negative for diaphoresis.  HENT: Negative for rhinorrhea and sore throat.   Eyes: Negative for discharge and redness.  Respiratory: Positive for cough and shortness of breath.   Cardiovascular: Negative for chest pain and leg swelling.  Gastrointestinal: Positive for nausea. Negative for vomiting, abdominal pain, diarrhea, constipation and abdominal distention.  Endocrine: Negative for polyuria.  Genitourinary: Negative for dysuria and flank pain.  Musculoskeletal: Negative for back pain and neck pain.  Skin: Positive for rash.  Neurological: Negative for headaches.  Hematological: Does not bruise/bleed easily.  Psychiatric/Behavioral: Negative for confusion.      Allergies  Lisinopril; Methotrexate; and Clindamycin/lincomycin  Home Medications   Prior to Admission medications   Medication Sig Start Date End Date Taking? Authorizing Provider  albuterol (PROVENTIL HFA;VENTOLIN HFA) 108 (90 BASE) MCG/ACT inhaler Inhale 1-2 puffs into the lungs every 6 (six) hours as needed for wheezing or shortness of breath. For  wheezing.    Historical Provider, MD  allopurinol (ZYLOPRIM) 100 MG tablet Take 100 mg by mouth daily.     Historical Provider, MD  aspirin EC 81 MG tablet Take 81 mg by mouth daily.     Historical Provider, MD  atorvastatin (LIPITOR) 40 MG tablet Take one tablet by mouth one time daily Patient taking differently: Take one tablet by mouth every evening.    Lisabeth Pick, MD  buPROPion (WELLBUTRIN XL) 300 MG 24 hr tablet Take 1 tablet (300 mg total) by mouth daily. 09/18/14   Samella Parr, NP  CARTIA XT 120 MG 24 hr capsule TAKE ONE CAPSULE BY MOUTH ONE TIME DAILY IN THE EVENING  10/03/14   Leonie Man, MD  carvedilol (COREG) 25 MG tablet TAKE ONE TABLET BY MOUTH TWICE DAILY with a meal. 12/20/14   Leonie Man, MD  chlorpheniramine-HYDROcodone (TUSSIONEX) 10-8 MG/5ML SUER Take 5 mLs by mouth every 6 (six) hours  as needed. Patient taking differently: Take 5 mLs by mouth every 6 (six) hours as needed for cough.  03/03/15   Elsie Stain, MD  cholecalciferol 5000 UNITS TABS Take 1 tablet (5,000 Units total) by mouth daily. 04/09/14   Kirstin Shepperson, PA-C  DEXILANT 60 MG capsule TAKE ONE CAPSULE BY MOUTH ONE TIME DAILY 03/14/15   Elsie Stain, MD  estradiol (ESTRACE) 2 MG tablet Take 2 mg by mouth at bedtime.  01/24/14   Historical Provider, MD  fenofibrate (TRICOR) 145 MG tablet TAKE ONE TABLET BY MOUTH ONE TIME DAILY  11/05/14   Leonie Man, MD  fluticasone [facility]) 50 MCG/ACT nasal spray USE TWO SPRAYS IN [facility] NOSTRIL DAILY 02/03/15   [name], MD  furosemide (LASIX) 40 MG tablet Take 1 tablet (40 mg total) by mouth 2 (two) times daily. 04/24/15   Leonie Man, MD  gabapentin (NEURONTIN) 100 MG capsule TAKE ONE CAPSULE BY MOUTH THREE TIMES DAILY 02/14/15   Philemon Kingdom, MD  glucose blood (ONE TOUCH ULTRA TEST) test strip 1 each by Other route 3 (three) times daily. Use as instructed 10/19/13   Lisabeth Pick, MD  HYDROcodone-acetaminophen (NORCO) 10-325 MG per tablet Take  1-2 tablets by mouth every 4 (four) hours as needed for moderate pain.  03/20/15   Historical Provider, MD  insulin aspart (NOVOLOG) 100 UNIT/ML injection Inject 10-18 Units into the skin 3 (three) times daily before meals.    Historical Provider, MD  Insulin Glargine (TOUJEO SOLOSTAR) 300 UNIT/ML SOPN Inject 40 Units into the skin at bedtime. 10/24/14   Philemon Kingdom, MD  losartan (COZAAR) 100 MG tablet Take 1 tablet (100 mg total) by mouth daily. NEED OV. 04/15/15   Leonie Man, MD  metFORMIN (GLUCOPHAGE) 1000 MG tablet TAKE ONE TABLET BY MOUTH TWICE DAILY with a meal. 12/29/14   Philemon Kingdom, MD  Misc. Devices (HIBICLENS HAND PUMP GALLON) MISC Shower and wash from neck down except left foot twice a day for 14 days for treatment of MRSA 03/29/15   Kirstin Shepperson, PA-C  mometasone-formoterol (DULERA) 200-5 MCG/ACT AERO Inhale 2 puffs into the lungs 2 (two) times daily. 06/15/13   Elsie Stain, MD  nitroGLYCERIN (NITROSTAT) 0.4 MG SL tablet Place 0.4 mg under the tongue every 5 (five) minutes as needed. x3 doses as needed for chest pain.    Historical Provider, MD  PARoxetine Mesylate (BRISDELLE) 7.5 MG CAPS Take 7.5 mg by mouth daily.    Historical Provider, MD  potassium chloride SA (K-DUR,KLOR-CON) 20 MEQ tablet TAKE ONE TABLET BY MOUTH THREE TIMES DAILY 03/12/15   [name], MD  predniSONE (DELTASONE) 5 MG tablet Take 1 tablet (5 mg total) by mouth daily with breakfast. Sig 5mg s daily 09/27/14   Samella Parr, NP  rifampin (RIFADIN) 300 MG capsule Take 1 capsule (300 mg total) by mouth every 12 (twelve) hours. 03/29/15   Kirstin Shepperson, PA-C  vancomycin 1,250 mg in sodium chloride 0.9 % 250 mL Inject 1,250 mg into the vein every 12 (twelve) hours. 03/29/15   Kirstin Shepperson, PA-C  venlafaxine XR (EFFEXOR-XR) 75 MG 24 hr capsule TAKE ONE CAPSULE BY MOUTH TWICE DAILY 03/31/15   [name], MD   BP 132/96 mmHg  Pulse 77  Temp(Src) 98.4 F (36.9 C) (Oral)  Resp 22  Ht  5\' 6"  (1.676 m)  Wt 272 lb (123.378 kg)  BMI 43.92 kg/m2  SpO2 98% Physical Exam  Constitutional: She appears well-developed  and well-nourished. No distress.  HENT:  Nose: Nose normal.  Mouth/Throat: Oropharynx is clear and moist.  Eyes: Conjunctivae are normal. Pupils are equal, round, and reactive to light. No scleral icterus.  Neck: Neck supple. No tracheal deviation present.  No stiffness or rigidity  Cardiovascular: Normal rate, regular rhythm, normal heart sounds and intact distal pulses.  Exam reveals no gallop and no friction rub.   No murmur heard. Pulmonary/Chest: Effort normal. No respiratory distress.  Upper resp congestion, non prod cough, episodic.   Abdominal: Soft. Normal appearance and bowel sounds are normal. She exhibits no distension and no mass. There is no tenderness. There is no rebound and no guarding.  Obese. Healed surgical scars, no incarc hernia.   Genitourinary:  No cva tenderness  Musculoskeletal: She exhibits no edema.  picc line site right upper arm without sign of infection. Left foot healing without sign of infection - no erythema, focal tenderness, or purulent drainage.  Neurological: She is alert.  Skin: Skin is warm and dry. Rash noted. She is not diaphoretic.  Faint, blotchy, erythematous, blanching rash to trunk and lower ext. No palms/soles. No mucous membrane lesions.   Psychiatric: She has a normal mood and affect.  Nursing note and vitals reviewed.   ED Course  Procedures (including critical care time) Labs Review  Results for orders placed or performed during the hospital encounter of 04/27/15  Blood culture (routine x 2)  Result Value Ref Range   Specimen Description BLOOD LEFT ANTECUBITAL    Special Requests BOTTLES DRAWN AEROBIC AND ANAEROBIC 5CC    Culture PENDING    Report Status PENDING   Basic metabolic panel  Result Value Ref Range   Sodium 137 135 - 145 mmol/L   Potassium 3.5 3.5 - 5.1 mmol/L   Chloride 101 101 - 111  mmol/L   CO2 24 22 - 32 mmol/L   Glucose, Bld 166 (H) 65 - 99 mg/dL   BUN 13 6 - 20 mg/dL   Creatinine, Ser 1.87 (H) 0.44 - 1.00 mg/dL   Calcium 8.5 (L) 8.9 - 10.3 mg/dL   GFR calc non Af Amer 29 (L) >60 mL/min   GFR calc Af Amer 34 (L) >60 mL/min   Anion gap 12 5 - 15  CBC  Result Value Ref Range   WBC 9.4 4.0 - 10.5 K/uL   RBC 3.10 (L) 3.87 - 5.11 MIL/uL   Hemoglobin 8.4 (L) 12.0 - 15.0 g/dL   HCT 27.2 (L) 36.0 - 46.0 %   MCV 87.7 78.0 - 100.0 fL   MCH 27.1 26.0 - 34.0 pg   MCHC 30.9 30.0 - 36.0 g/dL   RDW 15.4 11.5 - 15.5 %   Platelets 326 150 - 400 K/uL  Urinalysis, Routine w reflex microscopic (not at [facility])  Result Value Ref Range   Color, Urine YELLOW YELLOW   APPearance CLOUDY (A) CLEAR   Specific Gravity, Urine 1.008 1.005 - 1.030   pH 5.0 5.0 - 8.0   Glucose, UA NEGATIVE NEGATIVE mg/dL   Hgb urine dipstick NEGATIVE NEGATIVE   Bilirubin Urine NEGATIVE NEGATIVE   Ketones, ur NEGATIVE NEGATIVE mg/dL   Protein, ur NEGATIVE NEGATIVE mg/dL   Urobilinogen, UA 0.2 0.0 - 1.0 mg/dL   Nitrite NEGATIVE NEGATIVE   Leukocytes, UA SMALL (A) NEGATIVE  Urine microscopic-add on  Result Value Ref Range   Squamous Epithelial / LPF MANY (A) RARE   Bacteria, UA MANY (A) RARE  I-Stat CG4 Lactic Acid, ED  Result  Value Ref Range   Lactic Acid, Venous 2.23 (HH) 0.5 - 2.0 mmol/L   Comment NOTIFIED PHYSICIAN    *Note: Due to a large number of results and/or encounters for the requested time period, some results have not been displayed. A complete set of results can be found in Results Review.   Dg Chest 2 View  04/27/2015   CLINICAL DATA:  Shortness of breath for 3 days with dry cough for 3-4 days.  EXAM: CHEST  2 VIEW  COMPARISON:  CT chest 01/24/2014, chest x-ray 09/15/2014  FINDINGS: There are low lung volumes. There is mild bilateral interstitial thickening. There is no focal consolidation, pleural effusion or pneumothorax. The heart and mediastinum are stable. There is a right-sided  PICC line with the tip projecting over the SVC.  The osseous structures are unremarkable.  IMPRESSION: 1. Mild bilateral interstitial thickening which may be secondary to low lung volumes versus chronic interstitial disease given the patient's history of sarcoidosis versus interstitial infection   Electronically Signed   By: Kathreen Devoid   On: 04/27/2015 13:16       EKG Interpretation   Date/Time:  Sunday April 27 2015 11:27:22 EDT Ventricular Rate:  79 PR Interval:  146 QRS Duration: 86 QT Interval:  418 QTC Calculation: 479 R Axis:   64 Text Interpretation:  Normal sinus rhythm Nonspecific T wave abnormality  Poor data quality No significant change since last tracing Confirmed by  Ashok Cordia  MD, Lennette Bihari ([id number]) on 04/27/2015 11:45:18 AM      MDM   Iv ns bolus. Labs. Culture.  Cxr.  Reviewed nursing notes and prior charts for additional history.   Patient with subjective fever, and chills. Lactate elev. aki on labs. [name] currently receiving vancomycin iv infusion (from home).  Will add dose zosyn iv.   ua w many bact - will culture.  cxr without def infiltrate.   Iv ns boluses. pts weight 123K,  Total 2500 cc ns bolus (30 ml/kg).   Repeat lactate pending.   Medicine service contact re admission.  Dermatitis, unclear etiology.      Lajean Saver, MD 04/27/15 [phone]

## 2015-04-27 NOTE — Progress Notes (Signed)
ANTIBIOTIC CONSULT NOTE - INITIAL  Pharmacy Consult for vancomycin Indication: MRSA foot infxn, UTI  Allergies  Allergen Reactions  . Lisinopril Cough  . Methotrexate     REACTION: peri-oral and buccal lesions.  . Clindamycin/Lincomycin Nausea And Vomiting and Rash    Patient Measurements: Height: 5\' 6"  (167.6 cm) Weight: 272 lb (123.378 kg) IBW/kg (Calculated) : 59.3  Vital Signs: Temp: 98.4 F (36.9 C) (07/17 1137) Temp Source: Oral (07/17 1137) BP: 132/96 mmHg (07/17 1137) Pulse Rate: 77 (07/17 1137) Intake/Output from previous day:   Intake/Output from this shift:    Labs:  Recent Labs  04/27/15 1204  WBC 9.4  HGB 8.4*  PLT 326  CREATININE 1.87*   Estimated Creatinine Clearance: 45.6 mL/min (by C-G formula based on Cr of 1.87). No results for input(s): VANCOTROUGH, VANCOPEAK, VANCORANDOM, GENTTROUGH, GENTPEAK, GENTRANDOM, TOBRATROUGH, TOBRAPEAK, TOBRARND, AMIKACINPEAK, AMIKACINTROU, AMIKACIN in the last 72 hours.   Microbiology: Recent Results (from the past 720 hour(s))  Blood culture (routine x 2)     Status: None (Preliminary result)   Collection Time: 04/27/15 12:21 PM  Result Value Ref Range Status   Specimen Description BLOOD LEFT ANTECUBITAL  Final   Special Requests BOTTLES DRAWN AEROBIC AND ANAEROBIC 5CC  Final   Culture PENDING  Incomplete   Report Status PENDING  Incomplete    Medical History: Past Medical History  Diagnosis Date  . DIABETES MELLITUS, TYPE II 08/21/2006  . HYPERLIPIDEMIA 08/21/2006  . GOUT 08/20/2010  . OBESITY 06/04/2009  . ANEMIA-UNSPECIFIED 09/18/2009  . HYPERTENSION 08/21/2006  . GERD 08/21/2006  . SLEEP APNEA 04/21/2009    last testing was negative  . Internal hemorrhoids   . Pulmonary sarcoidosis     Followed locally by pulmonology, but also by Dr. Casper Harrison at [facility]  . Exertional chest pain     sharp, substernal, exertional  . Vocal cord dysfunction   . Chronic diastolic heart failure, NYHA class  2     LVEDP roughly 20% by cath  . CHF (congestive heart failure)   . COPD (chronic obstructive pulmonary disease)   . Depression   . Fracture of 5th metatarsal     non union  . Abnormal SPEP 04/17/2014    Medications:  Anti-infectives    Start     Dose/Rate Route Frequency Ordered Stop   04/27/15 1430  cefTRIAXone (ROCEPHIN) 1 g in dextrose 5 % 50 mL IVPB     1 g 100 mL/hr over 30 Minutes Intravenous Every 24 hours 04/27/15 1427     04/27/15 1345  piperacillin-tazobactam (ZOSYN) IVPB 3.375 g  Status:  Discontinued     3.375 g 100 mL/hr over 30 Minutes Intravenous  Once 04/27/15 1330 04/27/15 1427     Assessment: 64 yof currently completing a 6 week course of vancomycin for treatment of a MRSA infection presented to the ED with SOB and cough. Also starting on ceftriaxone. Vancomycin initially started at 6/16 at at dose of 1250mg  IV Q12H but now at 2gm IV Q12H. Last dose this AM. [place] called patient while in the ED to say her trough was elevated at 35. Scr is above baseline at 1.87.   Vanc 6/16 (PTA) >> (previously planned to continue to 05/13/15)  CTX 7/17>>  Goal of Therapy:  Vancomycin trough level 15-20 mcg/ml  Plan:  - Hold vanc - check a random level and BMET in AM to assess clearance - F/u renal fxn, C&S, clinical status and trough at [facility],  Rande Lawman 04/27/2015,2:45 PM

## 2015-04-27 NOTE — ED Notes (Signed)
Patient presents to ed c/o sob onset several days ago . States she is currently 3 weeks into a 6 weeks antibiotic treatment for MRSA of left foot. C/o fever and chills with dry cough and increased sob.c/o decreased appetite. C/o sore throat onset Fri.

## 2015-04-27 NOTE — ED Notes (Signed)
Attempted report 

## 2015-04-27 NOTE — H&P (Signed)
History and Physical  [name] GYI:[id number] DOB: 12-29-1960 DOA: 04/27/2015  Referring physician: EDP PCP: Garret Reddish, MD   Chief Complaint: sob/dry cough  HPI: [name] is a 54 y.o. female   H/o obesity, IDDM2, sarcoidosis on chronic low dose prednisone, htn, diastolic chf presented to the ED from home due to progressive DOE, sob, dry cough, she also reported chills, but no fever, she reported decreased appetite, feeling nauseous, no vomiting, no diarrhea, no abdominal pain, no dysuria.  ED course: cxr with mild bilateral interstitial thickening, labs showed cr 1.87, ua +bacteria, negative nitrite, small leukocytes. She was given ivf, zosyn, hospitalist called for admission.  Patient is examined in the ED, she does not appear in distress, on room air, family at bedside, she reported has been on IV vanc for left foot MRSA infection, she stopped rifampin due to GI side effect, developed diffuse itchy rash of unknown duration. She reported 5pounds of weight gain for last few days.   Review of Systems:  Detail per HPI, Review of systems are otherwise negative  Past Medical History  Diagnosis Date  . DIABETES MELLITUS, TYPE II 08/21/2006  . HYPERLIPIDEMIA 08/21/2006  . GOUT 08/20/2010  . OBESITY 06/04/2009  . ANEMIA-UNSPECIFIED 09/18/2009  . HYPERTENSION 08/21/2006  . GERD 08/21/2006  . SLEEP APNEA 04/21/2009    last testing was negative  . Internal hemorrhoids   . Pulmonary sarcoidosis     Followed locally by pulmonology, but also by Dr. Casper Harrison at [facility]  . Exertional chest pain     sharp, substernal, exertional  . Vocal cord dysfunction   . Chronic diastolic heart failure, NYHA class 2     LVEDP roughly 20% by cath  . CHF (congestive heart failure)   . COPD (chronic obstructive pulmonary disease)   . Depression   . Fracture of 5th metatarsal     non union  . Abnormal SPEP 04/17/2014   Past Surgical History  Procedure Laterality Date  .  Ventral hernia repair    . Nissen fundoplication  3500  . Cholecystectomy  1984  . Abdominal hysterectomy    . Knee arthroscopy      right  . Tubal ligation      with reversal in 1994  . Bladder suspension  11/11/2011    Procedure: TRANSVAGINAL TAPE (TVT) PROCEDURE;  Surgeon: Olga Millers, MD;  Location: [facility];  Service: Gynecology;  Laterality: N/A;  . Cystoscopy  11/11/2011    Procedure: CYSTOSCOPY;  Surgeon: Olga Millers, MD;  Location: [facility];  Service: Gynecology;  Laterality: N/A;  . Doppler echocardiography  02/12/2013    LV FUNCTION, SIZE NORMAL; MILD CONCENTRIC LVH; EST EF 55-65%; WALL MOTION NORMAL  . Lexiscan myoview  03/09/2013    EF 50%; NORMAL MYOCARDIAL PERFUSION STUDY - breast attenuation  . Cardiac catheterization  07/2010    LVEF 50-55% WITH VERY MILD GLOBAL HYPOKINESIA; ESSENTIALLY NORMAL CORONARY ARTERIES; NORMAL LV FUNCTION  . Carotids  02/18/11    CAROTID DUPLEX; VERTEBRALS ARE PATENT WITH ANTEGRADE FLOW. ICA/CCA RATIO 1.61 ON RIGHT AND 0.75 ON LEFT  . Right and left cardiac catheterization  04/23/2013    Angiographic normal coronaries; LVEDP 20 mmHg, PCWP 12-14 mmHg, RAP 12 mmHg.; Fick CO/CI 4.9/2.2  . Metatarsal osteotomy with open reduction internal fixation (orif) metatarsal with fusion Left 04/09/2014    Procedure: LEFT FOOT FRACTURE OPEN TREATMENT METATARSAL INCLUDES INTERNAL FIXATION EACH;  Surgeon: Lorn Junes, MD;  Location: [facility];  Service: Orthopedics;  Laterality: Left;  . Left and right heart catheterization with coronary angiogram N/A 04/23/2013    Procedure: LEFT AND RIGHT HEART CATHETERIZATION WITH CORONARY ANGIOGRAM;  Surgeon: [place]ie Man, MD;  Location: [facility];  Service: Cardiovascular;  Laterality: N/A;   Social History:  reports that she has never smoked. She has never used smokeless tobacco. She reports that she does not drink alcohol or use illicit drugs. Patient lives at home & is able to participate in  activities of daily living independently   Allergies  Allergen Reactions  . Lisinopril Cough  . Methotrexate     REACTION: peri-oral and buccal lesions.  . Clindamycin/Lincomycin Nausea And Vomiting and Rash    Family History  Problem Relation Age of Onset  . Diabetes Father   . Heart attack Father   . Coronary artery disease Father   . Heart failure Father   . COPD Mother   . Emphysema Mother   . Asthma Mother   . Heart failure Mother   . Sarcoidosis Maternal Uncle   . Colon cancer Neg Hx   . Lung cancer Brother   . Cancer Brother   . Diabetes Brother   . Heart attack Maternal Grandfather       Prior to Admission medications   Medication Sig Start Date End Date Taking? Authorizing Provider  albuterol (PROVENTIL HFA;VENTOLIN HFA) 108 (90 BASE) MCG/ACT inhaler Inhale 1-2 puffs into the lungs every 6 (six) hours as needed for wheezing or shortness of breath. For wheezing.    Historical Provider, MD  allopurinol (ZYLOPRIM) 100 MG tablet Take 100 mg by mouth daily.     Historical Provider, MD  aspirin EC 81 MG tablet Take 81 mg by mouth daily.     Historical Provider, MD  atorvastatin (LIPITOR) 40 MG tablet Take one tablet by mouth one time daily Patient taking differently: Take one tablet by mouth every evening.    Lisabeth Pick, MD  buPROPion (WELLBUTRIN XL) 300 MG 24 hr tablet Take 1 tablet (300 mg total) by mouth daily. 09/18/14   Samella Parr, NP  CARTIA XT 120 MG 24 hr capsule TAKE ONE CAPSULE BY MOUTH ONE TIME DAILY IN THE EVENING  10/03/14   [place]ie Man, MD  carvedilol (COREG) 25 MG tablet TAKE ONE TABLET BY MOUTH TWICE DAILY with a meal. 12/20/14   [place]ie Man, MD  chlorpheniramine-HYDROcodone (TUSSIONEX) 10-8 MG/5ML SUER Take 5 mLs by mouth every 6 (six) hours as needed. Patient taking differently: Take 5 mLs by mouth every 6 (six) hours as needed for cough.  03/03/15   Elsie Stain, MD  cholecalciferol 5000 UNITS TABS Take 1 tablet (5,000 Units total) by  mouth daily. 04/09/14   Kirstin Shepperson, PA-C  DEXILANT 60 MG capsule TAKE ONE CAPSULE BY MOUTH ONE TIME DAILY 03/14/15   Elsie Stain, MD  estradiol (ESTRACE) 2 MG tablet Take 2 mg by mouth at bedtime.  01/24/14   Historical Provider, MD  fenofibrate (TRICOR) 145 MG tablet TAKE ONE TABLET BY MOUTH ONE TIME DAILY  11/05/14   [place]ie Man, MD  fluticasone [facility]) 50 MCG/ACT nasal spray USE TWO SPRAYS IN [facility] NOSTRIL DAILY 02/03/15   [name], MD  furosemide (LASIX) 40 MG tablet Take 1 tablet (40 mg total) by mouth 2 (two) times daily. 04/24/15   [place]ie Man, MD  gabapentin (NEURONTIN) 100 MG capsule TAKE ONE CAPSULE BY MOUTH THREE TIMES DAILY 02/14/15  Philemon Kingdom, MD  glucose blood (ONE TOUCH ULTRA TEST) test strip 1 each by Other route 3 (three) times daily. Use as instructed 10/19/13   Lisabeth Pick, MD  HYDROcodone-acetaminophen (NORCO) 10-325 MG per tablet Take 1-2 tablets by mouth every 4 (four) hours as needed for moderate pain.  03/20/15   Historical Provider, MD  insulin aspart (NOVOLOG) 100 UNIT/ML injection Inject 10-18 Units into the skin 3 (three) times daily before meals.    Historical Provider, MD  Insulin Glargine (TOUJEO SOLOSTAR) 300 UNIT/ML SOPN Inject 40 Units into the skin at bedtime. 10/24/14   Philemon Kingdom, MD  losartan (COZAAR) 100 MG tablet Take 1 tablet (100 mg total) by mouth daily. NEED OV. 04/15/15   [place]ie Man, MD  metFORMIN (GLUCOPHAGE) 1000 MG tablet TAKE ONE TABLET BY MOUTH TWICE DAILY with a meal. 12/29/14   Philemon Kingdom, MD  Misc. Devices (HIBICLENS HAND PUMP GALLON) MISC Shower and wash from neck down except left foot twice a day for 14 days for treatment of MRSA 03/29/15   Kirstin Shepperson, PA-C  mometasone-formoterol (DULERA) 200-5 MCG/ACT AERO Inhale 2 puffs into the lungs 2 (two) times daily. 06/15/13   Elsie Stain, MD  nitroGLYCERIN (NITROSTAT) 0.4 MG SL tablet Place 0.4 mg under the tongue every 5 (five) minutes as needed. x3  doses as needed for chest pain.    Historical Provider, MD  PARoxetine Mesylate (BRISDELLE) 7.5 MG CAPS Take 7.5 mg by mouth daily.    Historical Provider, MD  potassium chloride SA (K-DUR,KLOR-CON) 20 MEQ tablet TAKE ONE TABLET BY MOUTH THREE TIMES DAILY 03/12/15   [name], MD  predniSONE (DELTASONE) 5 MG tablet Take 1 tablet (5 mg total) by mouth daily with breakfast. Sig 5mg s daily 09/27/14   Samella Parr, NP  rifampin (RIFADIN) 300 MG capsule Take 1 capsule (300 mg total) by mouth every 12 (twelve) hours. 03/29/15   Kirstin Shepperson, PA-C  vancomycin 1,250 mg in sodium chloride 0.9 % 250 mL Inject 1,250 mg into the vein every 12 (twelve) hours. 03/29/15   Kirstin Shepperson, PA-C  venlafaxine XR (EFFEXOR-XR) 75 MG 24 hr capsule TAKE ONE CAPSULE BY MOUTH TWICE DAILY 03/31/15   [name], MD    Physical Exam: BP 132/96 mmHg  Pulse 77  Temp(Src) 98.4 F (36.9 C) (Oral)  Resp 22  Ht 5\' 6"  (1.676 m)  Wt 123.378 kg (272 lb)  BMI 43.92 kg/m2  SpO2 98%  General:  AAOX4, NAD Eyes: PERRL ENT: unremarkable Neck: supple, no JVD Cardiovascular: RRR Respiratory: diminished at bases, mild rales at bases, no wheezing, no rhonchi Abdomen: soft/ND/ND, positive bowel sounds Skin: diffuse miliary to patch rash mostly on anterior chest and abdomen, mild erythematous, nontender, nonblanching Musculoskeletal:  No edema Psychiatric: calm/cooperative Neurologic: no focal findings            Labs on Admission:  Basic Metabolic Panel:  Recent Labs Lab 04/27/15 1204  NA 137  K 3.5  CL 101  CO2 24  GLUCOSE 166*  BUN 13  CREATININE 1.87*  CALCIUM 8.5*   Liver Function Tests: No results for input(s): AST, ALT, ALKPHOS, BILITOT, PROT, ALBUMIN in the last 168 hours. No results for input(s): LIPASE, AMYLASE in the last 168 hours. No results for input(s): AMMONIA in the last 168 hours. CBC:  Recent Labs Lab 04/27/15 1204  WBC 9.4  HGB 8.4*  HCT 27.2*  MCV 87.7  PLT 326     Cardiac Enzymes: No  results for input(s): CKTOTAL, CKMB, CKMBINDEX, TROPONINI in the last 168 hours.  BNP (last 3 results) No results for input(s): BNP in the last 8760 hours.  ProBNP (last 3 results)  Recent Labs  09/15/14 1520  PROBNP 47.8    CBG: No results for input(s): GLUCAP in the last 168 hours.  Radiological Exams on Admission: Dg Chest 2 View  04/27/2015   CLINICAL DATA:  Shortness of breath for 3 days with dry cough for 3-4 days.  EXAM: CHEST  2 VIEW  COMPARISON:  CT chest 01/24/2014, chest x-ray 09/15/2014  FINDINGS: There are low lung volumes. There is mild bilateral interstitial thickening. There is no focal consolidation, pleural effusion or pneumothorax. The heart and mediastinum are stable. There is a right-sided PICC line with the tip projecting over the SVC.  The osseous structures are unremarkable.  IMPRESSION: 1. Mild bilateral interstitial thickening which may be secondary to low lung volumes versus chronic interstitial disease given the patient's history of sarcoidosis versus interstitial infection   Electronically Signed   By: Kathreen Devoid   On: 04/27/2015 13:16    EKG: Independently reviewed. NSR, no acute st/t changes  Assessment/Plan Present on Admission:  . Anemia . SOB (shortness of breath)  SOB/cough: possible from fluids overload and diastolic CHF, though no lower extremity edema on exam, patient reported 5pounds of weight gain over the last several days, progressive DOE, she has been getting vancomycin home infusion bid but remain on the same dose of lasix. Will give lasix iv 80mg  x1 reassess in am. Echocardiogram ordered,  Elevation of cr: uti? From vanc infusion? Patient denies urinary symptom, dose report chills, but no fever. Dose has mild lactic acidosis (from metformin?). Will change zosyn (given in the ED) to rocephin. Renal US, hold metformin/losartan.  H/o sarcoidosis: on chronic prednisone 5mg  po qd, will change to IV solumedrol.  Itchy  Rash: non blanching, non tender, miliary to patchy, patient states just noticed today. On solumdedrol.  IDDM2: a1c pending, continue scheduled long acting insulin plus SSI  Recent MRSA left foot infection: continue iv vanc per pharmacy dosing, infectious disease consulted.  DVT prophylaxis: lovenox 30mg  subQ  Consultants: ID  Code Status: full   Family Communication:  Patient, her husband and father  Disposition Plan: admit to med tele  Time spent: 53mins  Bexley Mclester MD, PhD [facility] Pager [phone] If 7PM-7AM, please contact night-coverage at [email or web address], password [facility]

## 2015-04-28 DIAGNOSIS — R509 Fever, unspecified: Secondary | ICD-10-CM | POA: Insufficient documentation

## 2015-04-28 DIAGNOSIS — M868X7 Other osteomyelitis, ankle and foot: Secondary | ICD-10-CM

## 2015-04-28 DIAGNOSIS — R0609 Other forms of dyspnea: Secondary | ICD-10-CM

## 2015-04-28 DIAGNOSIS — B9689 Other specified bacterial agents as the cause of diseases classified elsewhere: Secondary | ICD-10-CM

## 2015-04-28 LAB — CBC
HCT: 27.1 % — ABNORMAL LOW (ref 36.0–46.0)
Hemoglobin: 8.6 g/dL — ABNORMAL LOW (ref 12.0–15.0)
MCH: 27.6 pg (ref 26.0–34.0)
MCHC: 31.7 g/dL (ref 30.0–36.0)
MCV: 86.9 fL (ref 78.0–100.0)
Platelets: 314 10*3/uL (ref 150–400)
RBC: 3.12 MIL/uL — ABNORMAL LOW (ref 3.87–5.11)
RDW: 14.9 % (ref 11.5–15.5)
WBC: 7.4 10*3/uL (ref 4.0–10.5)

## 2015-04-28 LAB — COMPREHENSIVE METABOLIC PANEL
ALT: 25 U/L (ref 14–54)
AST: 22 U/L (ref 15–41)
Albumin: 2.6 g/dL — ABNORMAL LOW (ref 3.5–5.0)
Alkaline Phosphatase: 269 U/L — ABNORMAL HIGH (ref 38–126)
Anion gap: 12 (ref 5–15)
BUN: 14 mg/dL (ref 6–20)
CO2: 26 mmol/L (ref 22–32)
Calcium: 8.1 mg/dL — ABNORMAL LOW (ref 8.9–10.3)
Chloride: 97 mmol/L — ABNORMAL LOW (ref 101–111)
Creatinine, Ser: 1.86 mg/dL — ABNORMAL HIGH (ref 0.44–1.00)
GFR calc Af Amer: 34 mL/min — ABNORMAL LOW (ref >60)
GFR calc non Af Amer: 29 mL/min — ABNORMAL LOW (ref >60)
Glucose, Bld: 389 mg/dL — ABNORMAL HIGH (ref 65–99)
Potassium: 4 mmol/L (ref 3.5–5.1)
Sodium: 135 mmol/L (ref 135–145)
Total Bilirubin: 1.6 mg/dL — ABNORMAL HIGH (ref 0.3–1.2)
Total Protein: 6.4 g/dL — ABNORMAL LOW (ref 6.5–8.1)

## 2015-04-28 LAB — HEMOGLOBIN A1C
Hgb A1c MFr Bld: 8.2 % — ABNORMAL HIGH (ref 4.8–5.6)
Mean Plasma Glucose: 189 mg/dL

## 2015-04-28 LAB — MRSA PCR SCREENING: MRSA by PCR: NEGATIVE

## 2015-04-28 LAB — VANCOMYCIN, RANDOM: Vancomycin Rm: 47 ug/mL

## 2015-04-28 LAB — GLUCOSE, CAPILLARY
Glucose-Capillary: 386 mg/dL — ABNORMAL HIGH (ref 65–99)
Glucose-Capillary: 302 mg/dL — ABNORMAL HIGH (ref 65–99)
Glucose-Capillary: 230 mg/dL — ABNORMAL HIGH (ref 65–99)
Glucose-Capillary: 382 mg/dL — ABNORMAL HIGH (ref 65–99)

## 2015-04-28 LAB — URINE CULTURE: Culture: 3000

## 2015-04-28 MED ORDER — INSULIN ASPART 100 UNIT/ML ~~LOC~~ SOLN
0.0000 [IU] | Freq: Three times a day (TID) | SUBCUTANEOUS | Status: DC
  Administered 2015-04-28: 7 [IU] via SUBCUTANEOUS
  Administered 2015-04-29: 11 [IU] via SUBCUTANEOUS

## 2015-04-28 MED ORDER — INSULIN GLARGINE 100 UNIT/ML ~~LOC~~ SOLN
25.0000 [IU] | Freq: Two times a day (BID) | SUBCUTANEOUS | Status: DC
  Administered 2015-04-28 (×2): 25 [IU] via SUBCUTANEOUS
  Filled 2015-04-28 (×3): qty 0.25

## 2015-04-28 MED ORDER — LINEZOLID 600 MG PO TABS
600.0000 mg | ORAL_TABLET | Freq: Two times a day (BID) | ORAL | Status: DC
  Administered 2015-04-28 – 2015-04-29 (×3): 600 mg via ORAL
  Filled 2015-04-28 (×4): qty 1

## 2015-04-28 MED ORDER — INSULIN ASPART 100 UNIT/ML ~~LOC~~ SOLN
0.0000 [IU] | Freq: Every day | SUBCUTANEOUS | Status: DC
  Administered 2015-04-28: 5 [IU] via SUBCUTANEOUS

## 2015-04-28 MED ORDER — ALTEPLASE 2 MG IJ SOLR
2.0000 mg | Freq: Once | INTRAMUSCULAR | Status: AC
  Administered 2015-04-28: 2 mg
  Filled 2015-04-28: qty 2

## 2015-04-28 MED ORDER — SODIUM CHLORIDE 0.9 % IJ SOLN
10.0000 mL | INTRAMUSCULAR | Status: DC | PRN
  Administered 2015-04-28: 10 mL
  Filled 2015-04-28: qty 40

## 2015-04-28 NOTE — Progress Notes (Signed)
[place] for Infectious Disease  Date of Admission:  04/27/2015  Antibiotics: Vancomycin ceftriaxone  Subjective: Feels better, foot/ankle feels fine  Objective: Temp:  [97.6 F (36.4 C)-99.6 F (37.6 C)] 97.8 F (36.6 C) (07/18 0800) Pulse Rate:  [65-86] 67 (07/18 0800) Resp:  [17-20] 18 (07/18 0800) BP: (113-141)/(48-69) 119/48 mmHg (07/18 0800) SpO2:  [94 %-98 %] 96 % (07/18 0911) Weight:  [271 lb 1.6 oz (122.97 kg)] 271 lb 1.6 oz (122.97 kg) (07/17 1545)  General: awake, alert nad Skin: no rashes Lungs: CTA Ext: left ankle with closed incision, no warmth, no tenderness, no edema  Lab Results Lab Results  Component Value Date   WBC 7.4 04/28/2015   HGB 8.6* 04/28/2015   HCT 27.1* 04/28/2015   MCV 86.9 04/28/2015   PLT 314 04/28/2015    Lab Results  Component Value Date   CREATININE 1.86* 04/28/2015   BUN 14 04/28/2015   NA 135 04/28/2015   K 4.0 04/28/2015   CL 97* 04/28/2015   CO2 26 04/28/2015    Lab Results  Component Value Date   ALT 25 04/28/2015   AST 22 04/28/2015   ALKPHOS 269* 04/28/2015   BILITOT 1.6* 04/28/2015      Microbiology: Recent Results (from the past 240 hour(s))  Blood culture (routine x 2)     Status: None (Preliminary result)   Collection Time: 04/27/15 12:06 PM  Result Value Ref Range Status   Specimen Description BLOOD LEFT HAND  Final   Special Requests BOTTLES DRAWN AEROBIC AND ANAEROBIC 5CC  Final   Culture NO GROWTH 1 DAY  Final   Report Status PENDING  Incomplete  Blood culture (routine x 2)     Status: None (Preliminary result)   Collection Time: 04/27/15 12:21 PM  Result Value Ref Range Status   Specimen Description BLOOD LEFT ANTECUBITAL  Final   Special Requests BOTTLES DRAWN AEROBIC AND ANAEROBIC 5CC  Final   Culture NO GROWTH 1 DAY  Final   Report Status PENDING  Incomplete  Urine culture     Status: None   Collection Time: 04/27/15 12:26 PM  Result Value Ref Range Status   Specimen Description  URINE, CLEAN CATCH  Final   Special Requests NONE  Final   Culture 3,000 COLONIES/mL INSIGNIFICANT GROWTH  Final   Report Status 04/28/2015 FINAL  Final  MRSA PCR Screening     Status: None   Collection Time: 04/27/15 10:27 PM  Result Value Ref Range Status   MRSA by PCR NEGATIVE NEGATIVE Final    Comment:        The GeneXpert MRSA Assay (FDA approved for NASAL specimens only), is one component of a comprehensive MRSA colonization surveillance program. It is not intended to diagnose MRSA infection nor to guide or monitor treatment for MRSA infections.     Studies/Results: Dg Chest 2 View  04/27/2015   CLINICAL DATA:  Shortness of breath for 3 days with dry cough for 3-4 days.  EXAM: CHEST  2 VIEW  COMPARISON:  CT chest 01/24/2014, chest x-ray 09/15/2014  FINDINGS: There are low lung volumes. There is mild bilateral interstitial thickening. There is no focal consolidation, pleural effusion or pneumothorax. The heart and mediastinum are stable. There is a right-sided PICC line with the tip projecting over the SVC.  The osseous structures are unremarkable.  IMPRESSION: 1. Mild bilateral interstitial thickening which may be secondary to low lung volumes versus chronic interstitial disease given the patient's history of sarcoidosis  versus interstitial infection   Electronically Signed   By: Kathreen Devoid   On: 04/27/2015 13:16   US Renal  04/27/2015   CLINICAL DATA:  Acute renal failure, history of diabetes and hypertension  EXAM: RENAL / URINARY TRACT ULTRASOUND COMPLETE  COMPARISON:  CT abdomen 08/23/2014  FINDINGS: Overall exam is limited by body habitus.  Right Kidney:  Length: 12.5 cm. Echogenicity within normal limits. No mass or hydronephrosis visualized.  Left Kidney:  Length: 13.2 cm. Echogenicity within normal limits. No mass or hydronephrosis visualized.  Bladder:  Appears normal for degree of bladder distention.  IMPRESSION: Normal renal ultrasound.   Electronically Signed   By: Kathreen Devoid   On: 04/27/2015 15:24    Assessment/Plan:  1) osteomyelitis of ankle - now has had about 4 weeks of treatment with vancomycin.  Will continue with linezolid oral through July 28th and stop. -will need to reduce her SSRIs to about half, will defer to Dr. Candiss Norse appropriate doses -can have picc line removed at discharge, though since no positive cultures, can leave in for now  Scharlene Gloss, [place] for Infectious Disease [place] [email or web address] [phone] pager   [phone] cell 04/28/2015, 1:47 PM

## 2015-04-28 NOTE — Progress Notes (Addendum)
Inpatient Diabetes Program Recommendations  AACE/ADA: New Consensus Statement on Inpatient Glycemic Control (2013)  Target Ranges:  Prepandial:   less than 140 mg/dL      Peak postprandial:   less than 180 mg/dL (1-2 hours)      Critically ill patients:  140 - 180 mg/dL   Results for [name], [name] (MRN [id number]) as of 04/28/2015 10:44  Ref. Range 04/27/2015 18:20 04/27/2015 21:42 04/28/2015 08:00  Glucose-Capillary Latest Ref Range: 65-99 mg/dL 143 (H) 269 (H) 386 (H)   Reason for Admission: SOB/Anemia   Diabetes history: DM 2 Outpatient Diabetes medications: Toujeo 40 units, Metformin 1,000 mg BID, Novolog 10-18 units TID meal coverage Current orders for Inpatient glycemic control: Lantus 40 units QHS, Novolog moderate scale  Inpatient Diabetes Program Recommendations Insulin - Basal: Glucose 386 this am. Patient receiving 60 mg IV Solumedrol Q24hrs. Please consider increasing basal to 48 units. Insulin - Meal Coverage: Patient takes 10-18 units of meal coverage TID at home. Please consider increasing correction and/or adding meal coverage.   Thanks,  Tama Headings RN, MSN, [facility] Inpatient Diabetes Coordinator Team Pager [phone]

## 2015-04-28 NOTE — Care Management Note (Addendum)
Case Management Note  Patient Details  Name: [name] MRN: [id number] Date of Birth: 01-24-1961  Subjective/Objective:                 Patient currently receiving [facility] RN and IV med services through [facility]. Will resume after discharge if ordered. Patient from home with spouse.    Action/Plan:  Discharge to home with spouse. Will follow for DC needs. Expected Discharge Date:                  Expected Discharge Plan:  [place]  In-House Referral:     Discharge planning Services  CM Consult  Post Acute Care Choice:  Home Health Choice offered to:  Patient  DME Arranged:    DME Agency:     HH Arranged:  RN [place] Agency:  [place]  Status of Service:  In process, will continue to follow  Medicare Important Message Given:    Date Medicare IM Given:    Medicare IM give by:    Date Additional Medicare IM Given:    Additional Medicare Important Message give by:     If discussed at [place] of Stay Meetings, dates discussed:    Additional Comments:  Carles Collet, RN 04/28/2015, 12:09 PM

## 2015-04-28 NOTE — Progress Notes (Signed)
[facility]  Patient Status: Active with visits up until hopsitalization  [facility] is providing the following services:SN  If patient discharges after hours, please call [phone].   [name] 04/28/2015, 10:33 AM

## 2015-04-28 NOTE — Progress Notes (Signed)
Utilization Review completed. Averlee Swartz RN BSN CM 

## 2015-04-28 NOTE — Progress Notes (Signed)
Patient Demographics:    Milania Haubner, is a 54 y.o. female, DOB - July 12, 1961, RXY:[id number]  Admit date - 04/27/2015   Admitting Physician Florencia Reasons, MD  Outpatient Primary MD for the patient is Garret Reddish, MD  LOS - 1   Chief Complaint  Patient presents with  . Shortness of Breath  . Cough        Subjective:    [name] today has, No headache, No chest pain, No abdominal pain - No Nausea, No new weakness tingling or numbness, mild dry Cough - No SOB.     Assessment  & Plan :    1. Left foot infection. Status post 4-1/2 weeks of IV vancomycin. Likely now developing rash and possible mild renal toxicity from supratherapeutic vancomycin. Vancomycin on hold, left foot infection stable. ID on board currently on Rocephin. Right arm PICC line site appears stable. Monitor blood culture results.   2. Mild dry cough and shortness of breath. Likely acute on chronic diastolic CHF last EF in November 2015 is 55%.  responded did well to IV Lasix, shortness of breath much better currently off oxygen, repeat echocardiogram ordered upon admission is pending. Continue Coreg, appears close to compensated. Monitor.   3. Dyslipidemia. On statin continue.   4. ARF. Likely due to supratherapeutic vancomycin, UA appears stable. Monitor BMP trend. Renal ultrasound stable. ARB on hold.   5. Essential hypertension. Currently on beta blocker, ARB on hold, will allow blood pressure to run slightly high due to renal failure and monitor.   6. History of sarcoidosis. Stable only supportive care for now.   7. Diffuse rash. Could be due to vancomycin toxicity. On Benadryl along with IV Solu-Medrol. Rash much improved. Vancomycin on hold. Monitor.   8. DM2 . Sugar in poor control due to IV steroids,  outpatient control poor as well as indicated by her A1c of 8.2. For now have increased her Lantus and sliding scale. Monitor.   CBG (last 3)   Recent Labs  04/27/15 2142 04/28/15 0800 04/28/15 1206  GLUCAP 269* 386* 302*     Code Status : Full  Family Communication  : multiple family members bedside  Disposition Plan  : Home 1-2 days  Consults  :  ID  Procedures  :    DVT Prophylaxis  :  Lovenox   Lab Results  Component Value Date   PLT 314 04/28/2015    Inpatient Medications  Scheduled Meds: . allopurinol  100 mg Oral Daily  . aspirin EC  81 mg Oral Daily  . atorvastatin  40 mg Oral q1800  . buPROPion  300 mg Oral Daily  . carvedilol  12.5 mg Oral BID WC  . cefTRIAXone (ROCEPHIN)  IV  1 g Intravenous Q24H  . diltiazem  120 mg Oral QHS  . enoxaparin (LOVENOX) injection  40 mg Subcutaneous Q24H  . estradiol  2 mg Oral QHS  . fluticasone  2 spray Each Nare Daily  . gabapentin  100 mg Oral TID  . insulin aspart  0-15 Units Subcutaneous TID WC  . insulin glargine  40 Units Subcutaneous QHS  . methylPREDNISolone (SOLU-MEDROL) injection  60 mg Intravenous Q24H  . mometasone-formoterol  2 puff Inhalation BID  . pantoprazole  40  mg Oral Daily  . paroxetine  7.5 mg Oral Daily  . venlafaxine XR  75 mg Oral BID   Continuous Infusions:  PRN Meds:.diphenhydrAMINE, HYDROcodone-acetaminophen, ipratropium-albuterol, nitroGLYCERIN, sodium chloride  Antibiotics  :     Anti-infectives    Start     Dose/Rate Route Frequency Ordered Stop   04/27/15 1600  cefTRIAXone (ROCEPHIN) 1 g in dextrose 5 % 50 mL IVPB - Premix     1 g 100 mL/hr over 30 Minutes Intravenous Every 24 hours 04/27/15 1542     04/27/15 1430  cefTRIAXone (ROCEPHIN) 1 g in dextrose 5 % 50 mL IVPB  Status:  Discontinued     1 g 100 mL/hr over 30 Minutes Intravenous Every 24 hours 04/27/15 1427 04/27/15 1542   04/27/15 1345  piperacillin-tazobactam (ZOSYN) IVPB 3.375 g  Status:  Discontinued     3.375 g 100  mL/hr over 30 Minutes Intravenous  Once 04/27/15 1330 04/27/15 1427        Objective:   Filed Vitals:   04/27/15 2144 04/28/15 0457 04/28/15 0800 04/28/15 0911  BP: 137/54 113/66 119/48   Pulse: 77 65 67   Temp: 98 F (36.7 C) 97.6 F (36.4 C) 97.8 F (36.6 C)   TempSrc: Oral Oral Oral   Resp: 20 17 18    Height:      Weight:      SpO2: 94% 97% 98% 96%    Wt Readings from Last 3 Encounters:  04/27/15 122.97 kg (271 lb 1.6 oz)  04/22/15 121.11 kg (267 lb)  03/29/15 126.2 kg (278 lb 3.5 oz)     Intake/Output Summary (Last 24 hours) at 04/28/15 1239 Last data filed at 04/28/15 1100  Gross per 24 hour  Intake    770 ml  Output   3150 ml  Net  -2380 ml     Physical Exam  Awake Alert, Oriented X 3, No new F.N deficits, Normal affect [place].AT,PERRAL Supple Neck,No JVD, No cervical lymphadenopathy appriciated.  Symmetrical Chest wall movement, Good air movement bilaterally, CTAB RRR,No Gallops,Rubs or new Murmurs, No Parasternal Heave +ve B.Sounds, Abd Soft, No tenderness, No organomegaly appriciated, No rebound - guarding or rigidity. No Cyanosis, Clubbing or edema, No new Rash or bruise  Fine diffuse morbilliform rash almost resolved      Data Review:   Micro Results Recent Results (from the past 240 hour(s))  Blood culture (routine x 2)     Status: None (Preliminary result)   Collection Time: 04/27/15 12:21 PM  Result Value Ref Range Status   Specimen Description BLOOD LEFT ANTECUBITAL  Final   Special Requests BOTTLES DRAWN AEROBIC AND ANAEROBIC 5CC  Final   Culture PENDING  Incomplete   Report Status PENDING  Incomplete  Urine culture     Status: None   Collection Time: 04/27/15 12:26 PM  Result Value Ref Range Status   Specimen Description URINE, CLEAN CATCH  Final   Special Requests NONE  Final   Culture 3,000 COLONIES/mL INSIGNIFICANT GROWTH  Final   Report Status 04/28/2015 FINAL  Final  MRSA PCR Screening     Status: None   Collection Time: 04/27/15  10:27 PM  Result Value Ref Range Status   MRSA by PCR NEGATIVE NEGATIVE Final    Comment:        The GeneXpert MRSA Assay (FDA approved for NASAL specimens only), is one component of a comprehensive MRSA colonization surveillance program. It is not intended to diagnose MRSA infection nor to guide or monitor  treatment for MRSA infections.     Radiology Reports Dg Chest 2 View  04/27/2015   CLINICAL DATA:  Shortness of breath for 3 days with dry cough for 3-4 days.  EXAM: CHEST  2 VIEW  COMPARISON:  CT chest 01/24/2014, chest x-ray 09/15/2014  FINDINGS: There are low lung volumes. There is mild bilateral interstitial thickening. There is no focal consolidation, pleural effusion or pneumothorax. The heart and mediastinum are stable. There is a right-sided PICC line with the tip projecting over the SVC.  The osseous structures are unremarkable.  IMPRESSION: 1. Mild bilateral interstitial thickening which may be secondary to low lung volumes versus chronic interstitial disease given the patient's history of sarcoidosis versus interstitial infection   Electronically Signed   By: Kathreen Devoid   On: 04/27/2015 13:16   US Renal  04/27/2015   CLINICAL DATA:  Acute renal failure, history of diabetes and hypertension  EXAM: RENAL / URINARY TRACT ULTRASOUND COMPLETE  COMPARISON:  CT abdomen 08/23/2014  FINDINGS: Overall exam is limited by body habitus.  Right Kidney:  Length: 12.5 cm. Echogenicity within normal limits. No mass or hydronephrosis visualized.  Left Kidney:  Length: 13.2 cm. Echogenicity within normal limits. No mass or hydronephrosis visualized.  Bladder:  Appears normal for degree of bladder distention.  IMPRESSION: Normal renal ultrasound.   Electronically Signed   By: Kathreen Devoid   On: 04/27/2015 15:24     CBC  Recent Labs Lab 04/27/15 1204 04/27/15 1654 04/28/15 0447  WBC 9.4 8.9 7.4  HGB 8.4* 8.7* 8.6*  HCT 27.2* 27.4* 27.1*  PLT 326 335 314  MCV 87.7 87.0 86.9  MCH 27.1  27.6 27.6  MCHC 30.9 31.8 31.7  RDW 15.4 15.2 14.9    Chemistries   Recent Labs Lab 04/27/15 1204 04/27/15 1654 04/28/15 0447  NA 137  --  135  K 3.5  --  4.0  CL 101  --  97*  CO2 24  --  26  GLUCOSE 166*  --  389*  BUN 13  --  14  CREATININE 1.87* 1.84* 1.86*  CALCIUM 8.5*  --  8.1*  AST  --   --  22  ALT  --   --  25  ALKPHOS  --   --  269*  BILITOT  --   --  1.6*   ------------------------------------------------------------------------------------------------------------------ estimated creatinine clearance is 45.7 mL/min (by C-G formula based on Cr of 1.86). ------------------------------------------------------------------------------------------------------------------  Recent Labs  04/27/15 1654  HGBA1C 8.2*   CBG (last 3)   Recent Labs  04/27/15 2142 04/28/15 0800 04/28/15 1206  GLUCAP 269* 386* 302*     ------------------------------------------------------------------------------------------------------------------ No results for input(s): CHOL, HDL, LDLCALC, TRIG, CHOLHDL, LDLDIRECT in the last 72 hours. ------------------------------------------------------------------------------------------------------------------ No results for input(s): TSH, T4TOTAL, T3FREE, THYROIDAB in the last 72 hours.  Invalid input(s): FREET3 ------------------------------------------------------------------------------------------------------------------ No results for input(s): VITAMINB12, FOLATE, FERRITIN, TIBC, IRON, RETICCTPCT in the last 72 hours.  Coagulation profile No results for input(s): INR, PROTIME in the last 168 hours.  No results for input(s): DDIMER in the last 72 hours.  Cardiac Enzymes  Recent Labs Lab 04/27/15 1654  TROPONINI <0.03   ------------------------------------------------------------------------------------------------------------------ Invalid input(s): POCBNP   Time Spent in minutes   35   SINGH,PRASHANT K M.D on 04/28/2015  at 12:39 PM  Between 7am to 7pm - Pager - [phone]  After 7pm go to [email or web address] - password [email or web address]  [facility] -  Office  [phone]

## 2015-04-29 ENCOUNTER — Inpatient Hospital Stay (HOSPITAL_COMMUNITY): Payer: BLUE CROSS/BLUE SHIELD

## 2015-04-29 ENCOUNTER — Other Ambulatory Visit: Payer: Self-pay | Admitting: Internal Medicine

## 2015-04-29 LAB — BASIC METABOLIC PANEL
Anion gap: 10 (ref 5–15)
BUN: 19 mg/dL (ref 6–20)
CO2: 27 mmol/L (ref 22–32)
Calcium: 8 mg/dL — ABNORMAL LOW (ref 8.9–10.3)
Chloride: 95 mmol/L — ABNORMAL LOW (ref 101–111)
Creatinine, Ser: 1.7 mg/dL — ABNORMAL HIGH (ref 0.44–1.00)
GFR calc Af Amer: 38 mL/min — ABNORMAL LOW (ref >60)
GFR calc non Af Amer: 33 mL/min — ABNORMAL LOW (ref >60)
Glucose, Bld: 370 mg/dL — ABNORMAL HIGH (ref 65–99)
Potassium: 4 mmol/L (ref 3.5–5.1)
Sodium: 132 mmol/L — ABNORMAL LOW (ref 135–145)

## 2015-04-29 LAB — GLUCOSE, CAPILLARY: Glucose-Capillary: 258 mg/dL — ABNORMAL HIGH (ref 65–99)

## 2015-04-29 LAB — HEMOGLOBIN A1C
Hgb A1c MFr Bld: 8.2 % — ABNORMAL HIGH (ref 4.8–5.6)
Mean Plasma Glucose: 189 mg/dL

## 2015-04-29 LAB — CBC
HCT: 26.1 % — ABNORMAL LOW (ref 36.0–46.0)
Hemoglobin: 8.5 g/dL — ABNORMAL LOW (ref 12.0–15.0)
MCH: 28.2 pg (ref 26.0–34.0)
MCHC: 32.6 g/dL (ref 30.0–36.0)
MCV: 86.7 fL (ref 78.0–100.0)
Platelets: 339 10*3/uL (ref 150–400)
RBC: 3.01 MIL/uL — ABNORMAL LOW (ref 3.87–5.11)
RDW: 14.7 % (ref 11.5–15.5)
WBC: 7.5 10*3/uL (ref 4.0–10.5)

## 2015-04-29 MED ORDER — POTASSIUM CHLORIDE CRYS ER 20 MEQ PO TBCR: 20.0000 meq | EXTENDED_RELEASE_TABLET | Freq: Three times a day (TID) | ORAL | Status: DC

## 2015-04-29 MED ORDER — BUPROPION HCL ER (XL) 150 MG PO TB24: 150.0000 mg | ORAL_TABLET | Freq: Every day | ORAL | Status: DC

## 2015-04-29 MED ORDER — LINEZOLID 600 MG PO TABS: 600.0000 mg | ORAL_TABLET | Freq: Two times a day (BID) | ORAL | Status: DC

## 2015-04-29 MED ORDER — INSULIN GLARGINE 100 UNIT/ML ~~LOC~~ SOLN
35.0000 [IU] | Freq: Two times a day (BID) | SUBCUTANEOUS | Status: DC
  Administered 2015-04-29: 35 [IU] via SUBCUTANEOUS
  Filled 2015-04-29 (×2): qty 0.35

## 2015-04-29 MED ORDER — PREDNISONE 20 MG PO TABS: 40.0000 mg | ORAL_TABLET | Freq: Every day | ORAL | Status: DC

## 2015-04-29 MED ORDER — INSULIN ASPART 100 UNIT/ML ~~LOC~~ SOLN
5.0000 [IU] | Freq: Three times a day (TID) | SUBCUTANEOUS | Status: DC
  Administered 2015-04-29: 5 [IU] via SUBCUTANEOUS

## 2015-04-29 MED ORDER — FUROSEMIDE 40 MG PO TABS: 40.0000 mg | ORAL_TABLET | Freq: Two times a day (BID) | ORAL | Status: DC

## 2015-04-29 MED ORDER — VENLAFAXINE HCL ER 75 MG PO CP24: 75.0000 mg | ORAL_CAPSULE | Freq: Two times a day (BID) | ORAL | Status: DC

## 2015-04-29 MED ORDER — DOXYCYCLINE HYCLATE 100 MG PO CAPS: 100.0000 mg | ORAL_CAPSULE | Freq: Two times a day (BID) | ORAL | Status: DC

## 2015-04-29 MED ORDER — PAROXETINE HCL 10 MG/5ML PO SUSP
3.5000 mg | Freq: Every day | ORAL | Status: DC
  Filled 2015-04-29 (×8): qty 1.8

## 2015-04-29 MED ORDER — DIPHENHYDRAMINE HCL 25 MG PO CAPS: 25.0000 mg | ORAL_CAPSULE | Freq: Four times a day (QID) | ORAL | Status: DC | PRN

## 2015-04-29 NOTE — Progress Notes (Addendum)
Benefit check for Zyvox submitted S/W MIKE @ [employer] OTHER # [id number]   ZYVOX 600 MG BID X 14 DAYS  COVER- YES  CO-PAY- CO-INS $100.00 MAX  TIER- 3 DRUG  PRIOR APPROVAL - YES # [phone]  PHARMACY - ANY RETAIL

## 2015-04-29 NOTE — Progress Notes (Signed)
Pt. Received discharge instructions and prescriptions. Educated pt. On follow-up appointments and Doxycycline. Removed PICC line.  No new skin issues noted. All questions answered. No further needs noted at this time.

## 2015-04-29 NOTE — Discharge Instructions (Signed)
Follow with Primary MD Garret Reddish, MD in 7 days   Get CBC, CMP, UA, 2 view Chest X ray checked  by Primary MD next visit.    Activity: As tolerated with Full fall precautions use walker/cane & assistance as needed   Disposition Home    Diet: Heart Healthy Low Carb.  Accuchecks 4 times/day, Once in AM empty stomach and then before each meal. Log in all results and show them to your Prim.MD in 3 days. If any glucose reading is under 80 or above 300 call your Prim MD immidiately. Follow Low glucose instructions for glucose under 80 as instructed.    For Heart failure patients - Check your Weight same time everyday, if you gain over 2 pounds, or you develop in leg swelling, experience more shortness of breath or chest pain, call your Primary MD immediately. Follow Cardiac Low Salt Diet and 1.5 lit/day fluid restriction.   On your next visit with your primary care physician please Get Medicines reviewed and adjusted.   Please request your Prim.MD to go over all Hospital Tests and Procedure/Radiological results at the follow up, please get all Hospital records sent to your Prim MD by signing hospital release before you go home.   If you experience worsening of your admission symptoms, develop shortness of breath, life threatening emergency, suicidal or homicidal thoughts you must seek medical attention immediately by calling 911 or calling your MD immediately  if symptoms less severe.  You Must read complete instructions/literature along with all the possible adverse reactions/side effects for all the Medicines you take and that have been prescribed to you. Take any new Medicines after you have completely understood and accpet all the possible adverse reactions/side effects.   Do not drive, operating heavy machinery, perform activities at heights, swimming or participation in water activities or provide baby sitting services if your were admitted for syncope or siezures until you have  seen by Primary MD or a Neurologist and advised to do so again.  Do not drive when taking Pain medications.    Do not take more than prescribed Pain, Sleep and Anxiety Medications  Special Instructions: If you have smoked or chewed Tobacco  in the last 2 yrs please stop smoking, stop any regular Alcohol  and or any Recreational drug use.  Wear Seat belts while driving.   Please note  You were cared for by a hospitalist during your hospital stay. If you have any questions about your discharge medications or the care you received while you were in the hospital after you are discharged, you can call the unit and asked to speak with the hospitalist on call if the hospitalist that took care of you is not available. Once you are discharged, your primary care physician will handle any further medical issues. Please note that NO REFILLS for any discharge medications will be authorized once you are discharged, as it is imperative that you return to your primary care physician (or establish a relationship with a primary care physician if you do not have one) for your aftercare needs so that they can reassess your need for medications and monitor your lab values.

## 2015-04-29 NOTE — Progress Notes (Signed)
Per Dr Merita Norton, patient will be discharged on doxy, not zyvox- could not obtain authorization. Updated Tiffany RN to change AVS and look for Rx in printer on 5W.

## 2015-04-29 NOTE — Discharge Summary (Addendum)
[name], is a 54 y.o. female  DOB [birth year]  MRN [id number].  Admission date:  04/27/2015  Admitting Physician  Florencia Reasons, MD  Discharge Date:  04/29/2015   Primary MD  Garret Reddish, MD  Recommendations for primary care physician for things to follow:    Monitor glycemic control closely, check CBC, BMP, UA and a 2 view chest x-ray next visit   Admission Diagnosis  Cough [R05] Dehydration [E86.0] Fever and chills [R50.9] ARF (acute renal failure) [N17.9] Bacteriuria [N39.0] AKI (acute kidney injury) [N17.9]   Discharge Diagnosis  Cough [R05] Dehydration [E86.0] Fever and chills [R50.9] ARF (acute renal failure) [N17.9] Bacteriuria [N39.0] AKI (acute kidney injury) [N17.9]     Principal Problem:   Dyspnea on exertion Active Problems:   GERD   Anemia   Chronic diastolic heart failure   SOB (shortness of breath)   Acute renal insufficiency   Rash   Rigors   Left foot infection   Fever and chills      Past Medical History  Diagnosis Date  . DIABETES MELLITUS, TYPE II 08/21/2006  . HYPERLIPIDEMIA 08/21/2006  . GOUT 08/20/2010  . OBESITY 06/04/2009  . ANEMIA-UNSPECIFIED 09/18/2009  . HYPERTENSION 08/21/2006  . GERD 08/21/2006  . SLEEP APNEA 04/21/2009    last testing was negative  . Internal hemorrhoids   . Pulmonary sarcoidosis     Followed locally by pulmonology, but also by Dr. Casper Harrison at [facility]  . Exertional chest pain     sharp, substernal, exertional  . Vocal cord dysfunction   . Chronic diastolic heart failure, NYHA class 2     LVEDP roughly 20% by cath  . CHF (congestive heart failure)   . COPD (chronic obstructive pulmonary disease)   . Depression   . Fracture of 5th metatarsal     non union  . Abnormal SPEP 04/17/2014    Past Surgical History    Procedure Laterality Date  . Ventral hernia repair    . Nissen fundoplication  3419  . Cholecystectomy  1984  . Abdominal hysterectomy    . Knee arthroscopy      right  . Tubal ligation      with reversal in 1994  . Bladder suspension  11/11/2011    Procedure: TRANSVAGINAL TAPE (TVT) PROCEDURE;  Surgeon: Olga Millers, MD;  Location: [facility];  Service: Gynecology;  Laterality: N/A;  . Cystoscopy  11/11/2011    Procedure: CYSTOSCOPY;  Surgeon: Olga Millers, MD;  Location: [facility];  Service: Gynecology;  Laterality: N/A;  . Doppler echocardiography  02/12/2013    LV FUNCTION, SIZE NORMAL; MILD CONCENTRIC LVH; EST EF 55-65%; WALL MOTION NORMAL  . Lexiscan myoview  03/09/2013    EF 50%; NORMAL MYOCARDIAL PERFUSION STUDY - breast attenuation  . Cardiac catheterization  07/2010    LVEF 50-55% WITH VERY MILD GLOBAL HYPOKINESIA; ESSENTIALLY NORMAL CORONARY ARTERIES; NORMAL LV FUNCTION  . Carotids  02/18/11    CAROTID DUPLEX; VERTEBRALS ARE PATENT WITH ANTEGRADE FLOW. ICA/CCA RATIO  1.61 ON RIGHT AND 0.75 ON LEFT  . Right and left cardiac catheterization  04/23/2013    Angiographic normal coronaries; LVEDP 20 mmHg, PCWP 12-14 mmHg, RAP 12 mmHg.; Fick CO/CI 4.9/2.2  . Metatarsal osteotomy with open reduction internal fixation (orif) metatarsal with fusion Left 04/09/2014    Procedure: LEFT FOOT FRACTURE OPEN TREATMENT METATARSAL INCLUDES INTERNAL FIXATION EACH;  Surgeon: Lorn Junes, MD;  Location: [place];  Service: Orthopedics;  Laterality: Left;  . Left and right heart catheterization with coronary angiogram N/A 04/23/2013    Procedure: LEFT AND RIGHT HEART CATHETERIZATION WITH CORONARY ANGIOGRAM;  Surgeon: Leonie Man, MD;  Location: [facility] C[facility];  Service: Cardiovascular;  Laterality: N/A;       HPI  from the history and physical done on the day of admission:    [name] is a 54 y.o. female H/o obesity, IDDM2, sarcoidosis on chronic low dose prednisone, htn,  diastolic chf presented to the ED from home due to progressive DOE, sob, dry cough, she also reported chills, but no fever, she reported decreased appetite, feeling nauseous, no vomiting, no diarrhea, no abdominal pain, no dysuria.  ED course: cxr with mild bilateral interstitial thickening, labs showed cr 1.87, ua +bacteria, negative nitrite, small leukocytes. She was given ivf, zosyn, hospitalist called for admission.  Patient is examined in the ED, she does not appear in distress, on room air, family at bedside, she reported has been on IV vanc for left foot MRSA infection, she stopped rifampin due to GI side effect, developed diffuse itchy rash of unknown duration. She reported 5pounds of weight gain for last few days.     Hospital Course:     1. Left foot infection. Status post 4-1/2 weeks of IV vancomycin. Likely now developing rash and possible mild renal toxicity from supratherapeutic vancomycin. Vancomycin was stopped, she was seen by ID and switched to Zyvox oral, rash almost completely resolved after supportive care with Benadryl and few doses of IV steroids. Renal function has improved. PICC line will be removed and she will be placed on close to 2 weeks of oral Doxy  and discharged home with outpatient ID follow-up. Case D/W Dr Linus Salmons today.    2. Mild dry cough and shortness of breath. Likely acute on chronic diastolic CHF last EF in November 2015 is 55%. responded well to IV Lasix, shortness of breath much better now completely resolved, she has underlying sarcoidosis and x-ray changes suggestive of the same without any acute vascular congestion. She will be continued on her home dose Coreg, resume Lasix from day after tomorrow as now appears compensated with no edema, renal function improving.   3. Dyslipidemia. On statin continue.   4. ARF. Likely due to supratherapeutic vancomycin, UA appears stable. Monitor BMP trend. Renal ultrasound stable. ARB on hold. Skip Lasix couple of  doses. Request PCP to recheck UA and BMP in 7-10 days.   5. Essential hypertension. Currently on beta blocker, ARB on hold, will allow blood pressure to run slightly high due to renal failure and monitor.   6. History of sarcoidosis. Stable only supportive care for now.   7. Diffuse rash. Could be due to vancomycin toxicity. Rash much improved and almost completely resolved. Vancomycin on hold. Discharge on home dose prednisone for sarcoidosis along with when necessary Benadryl.   8. DM2 . Sugar in poor control due to IV steroids, outpatient control poor as well as indicated by her A1c of 8.2. Since he  will be discharged on her home dose prednisone will leave her on her home regimen of insulin with follow-up with PCP for glycemic control.    Lab Results  Component Value Date   HGBA1C 8.2* 04/28/2015    CBG (last 3)   Recent Labs  04/28/15 1733 04/28/15 2146 04/29/15 0749  GLUCAP 230* 382* 258*       Discharge Condition: Fair  Follow UP  Follow-up Information    Follow up with Garret Reddish, MD. Schedule an appointment as soon as possible for a visit in 1 week.   Specialty:  Family Medicine   Contact information:   [address] [place] [address] [phone]       Follow up with Scharlene Gloss, MD. Schedule an appointment as soon as possible for a visit in 1 week.   Specialty:  Infectious Diseases   Contact information:   [address] [address] [phone]        Consults obtained - None  Diet and Activity recommendation: See Discharge Instructions below  Discharge Instructions           Discharge Instructions    Discharge instructions    Complete by:  As directed   Follow with Primary MD Garret Reddish, MD in 7 days   Get CBC, CMP, UA, 2 view Chest X ray checked  by Primary MD next visit.    Activity: As tolerated with Full fall precautions use walker/cane & assistance as needed   Disposition Home     Diet: Heart Healthy Low Carb.  Accuchecks 4 times/day, Once in AM empty stomach and then before each meal. Log in all results and show them to your Prim.MD in 3 days. If any glucose reading is under 80 or above 300 call your Prim MD immidiately. Follow Low glucose instructions for glucose under 80 as instructed.    For Heart failure patients - Check your Weight same time everyday, if you gain over 2 pounds, or you develop in leg swelling, experience more shortness of breath or chest pain, call your Primary MD immediately. Follow Cardiac Low Salt Diet and 1.5 lit/day fluid restriction.   On your next visit with your primary care physician please Get Medicines reviewed and adjusted.   Please request your Prim.MD to go over all Hospital Tests and Procedure/Radiological results at the follow up, please get all Hospital records sent to your Prim MD by signing hospital release before you go home.   If you experience worsening of your admission symptoms, develop shortness of breath, life threatening emergency, suicidal or homicidal thoughts you must seek medical attention immediately by calling 911 or calling your MD immediately  if symptoms less severe.  You Must read complete instructions/literature along with all the possible adverse reactions/side effects for all the Medicines you take and that have been prescribed to you. Take any new Medicines after you have completely understood and accpet all the possible adverse reactions/side effects.   Do not drive, operating heavy machinery, perform activities at heights, swimming or participation in water activities or provide baby sitting services if your were admitted for syncope or siezures until you have seen by Primary MD or a Neurologist and advised to do so again.  Do not drive when taking Pain medications.    Do not take more than prescribed Pain, Sleep and Anxiety Medications  Special Instructions: If you have smoked or chewed  Tobacco  in the last 2 yrs please stop smoking, stop any  regular Alcohol  and or any Recreational drug use.  Wear Seat belts while driving.   Please note  You were cared for by a hospitalist during your hospital stay. If you have any questions about your discharge medications or the care you received while you were in the hospital after you are discharged, you can call the unit and asked to speak with the hospitalist on call if the hospitalist that took care of you is not available. Once you are discharged, your primary care physician will handle any further medical issues. Please note that NO REFILLS for any discharge medications will be authorized once you are discharged, as it is imperative that you return to your primary care physician (or establish a relationship with a primary care physician if you do not have one) for your aftercare needs so that they can reassess your need for medications and monitor your lab values.     Increase activity slowly    Complete by:  As directed              Discharge Medications       Medication List    STOP taking these medications        losartan 100 MG tablet  Commonly known as:  COZAAR     vancomycin 1,250 mg in sodium chloride 0.9 % 250 mL      TAKE these medications        albuterol 108 (90 BASE) MCG/ACT inhaler  Commonly known as:  PROVENTIL HFA;VENTOLIN HFA  Inhale 1-2 puffs into the lungs every 6 (six) hours as needed for wheezing or shortness of breath. For wheezing.     allopurinol 100 MG tablet  Commonly known as:  ZYLOPRIM  Take 100 mg by mouth at bedtime.     aspirin EC 81 MG tablet  Take 81 mg by mouth daily.     atorvastatin 40 MG tablet  Commonly known as:  LIPITOR  Take one tablet by mouth one time daily     BRISDELLE 7.5 MG Caps  Generic drug:  PARoxetine Mesylate  Take 7.5 mg by mouth daily.     buPROPion 150 MG 24 hr tablet  Commonly known as:  WELLBUTRIN XL  Take 1 tablet (150 mg total) by mouth daily.      CARAFATE 1 GM/10ML suspension  Generic drug:  sucralfate  Take 10 mLs by mouth 2 (two) times daily. For 14 days (starting 04-16-15)     CARTIA XT 120 MG 24 hr capsule  Generic drug:  diltiazem  TAKE ONE CAPSULE BY MOUTH ONE TIME DAILY IN THE EVENING     carvedilol 25 MG tablet  Commonly known as:  COREG  TAKE ONE TABLET BY MOUTH TWICE DAILY with a meal.     chlorpheniramine-HYDROcodone 10-8 MG/5ML Suer  Commonly known as:  TUSSIONEX  Take 5 mLs by mouth every 6 (six) hours as needed.     cholecalciferol 1000 UNITS tablet  Commonly known as:  VITAMIN D  Take 1,000 Units by mouth daily.     Cholecalciferol 5000 UNITS Tabs  Take 1 tablet (5,000 Units total) by mouth daily.     DEXILANT 60 MG capsule  Generic drug:  dexlansoprazole  TAKE ONE CAPSULE BY MOUTH ONE TIME DAILY     diphenhydrAMINE 25 mg capsule  Commonly known as:  BENADRYL  Take 1 capsule (25 mg total) by mouth every 6 (six) hours as needed for itching.     doxycycline 100 MG capsule  Commonly known as:  VIBRAMYCIN  Take 1 capsule (100 mg total) by mouth 2 (two) times daily.     estradiol 2 MG tablet  Commonly known as:  ESTRACE  Take 2 mg by mouth at bedtime.     fenofibrate 145 MG tablet  Commonly known as:  TRICOR  TAKE ONE TABLET BY MOUTH ONE TIME DAILY     fluticasone 50 MCG/ACT nasal spray  Commonly known as:  FLONASE  USE TWO SPRAYS IN EACH NOSTRIL DAILY     furosemide 40 MG tablet  Commonly known as:  LASIX  Take 1 tablet (40 mg total) by mouth 2 (two) times daily.  Start taking on:  05/01/2015     gabapentin 100 MG capsule  Commonly known as:  NEURONTIN  TAKE ONE CAPSULE BY MOUTH THREE TIMES DAILY     glucose blood test strip  Commonly known as:  ONE TOUCH ULTRA TEST  1 each by Other route 3 (three) times daily. Use as instructed     HYDROcodone-acetaminophen 10-325 MG per tablet  Commonly known as:  NORCO  Take 1-2 tablets by mouth every 4 (four) hours as needed for moderate pain.      Insulin Glargine 300 UNIT/ML Sopn  Commonly known as:  TOUJEO SOLOSTAR  Inject 40 Units into the skin at bedtime.     metFORMIN 1000 MG tablet  Commonly known as:  GLUCOPHAGE  TAKE ONE TABLET BY MOUTH TWICE DAILY with a meal.     mometasone-formoterol 200-5 MCG/ACT Aero  Commonly known as:  DULERA  Inhale 2 puffs into the lungs 2 (two) times daily.     nitroGLYCERIN 0.4 MG SL tablet  Commonly known as:  NITROSTAT  Place 0.4 mg under the tongue every 5 (five) minutes as needed. x3 doses as needed for chest pain.     NOVOLOG FLEXPEN 100 UNIT/ML FlexPen  Generic drug:  insulin aspart  Inject 10-18 Units into the skin 3 (three) times daily with meals.     ondansetron 4 MG disintegrating tablet  Commonly known as:  ZOFRAN-ODT  Take 4 mg by mouth every 8 (eight) hours as needed for nausea or vomiting.     potassium chloride SA 20 MEQ tablet  Commonly known as:  K-DUR,KLOR-CON  Take 1 tablet (20 mEq total) by mouth 3 (three) times daily.  Start taking on:  05/01/2015     predniSONE 5 MG tablet  Commonly known as:  DELTASONE  Take 1 tablet (5 mg total) by mouth daily with breakfast. Sig 5mg s daily     venlafaxine XR 75 MG 24 hr capsule  Commonly known as:  EFFEXOR-XR  Take 1 capsule (75 mg total) by mouth 2 (two) times daily.     vitamin B-12 1000 MCG tablet  Commonly known as:  CYANOCOBALAMIN  Take 1,000 mcg by mouth daily.        Major procedures and Radiology Reports - PLEASE review detailed and final reports for all details, in brief -       Dg Chest 2 View  04/27/2015   CLINICAL DATA:  Shortness of breath for 3 days with dry cough for 3-4 days.  EXAM: CHEST  2 VIEW  COMPARISON:  CT chest 01/24/2014, chest x-ray 09/15/2014  FINDINGS: There are low lung volumes. There is mild bilateral interstitial thickening. There is no focal consolidation, pleural effusion or pneumothorax. The heart and mediastinum are stable. There is a right-sided PICC line with the tip projecting  over the SVC.  The osseous structures are unremarkable.  IMPRESSION: 1. Mild bilateral interstitial thickening which may be secondary to low lung volumes versus chronic interstitial disease given the patient's history of sarcoidosis versus interstitial infection   Electronically Signed   By: Kathreen Devoid   On: 04/27/2015 13:16   US Renal  04/27/2015   CLINICAL DATA:  Acute renal failure, history of diabetes and hypertension  EXAM: RENAL / URINARY TRACT ULTRASOUND COMPLETE  COMPARISON:  CT abdomen 08/23/2014  FINDINGS: Overall exam is limited by body habitus.  Right Kidney:  Length: 12.5 cm. Echogenicity within normal limits. No mass or hydronephrosis visualized.  Left Kidney:  Length: 13.2 cm. Echogenicity within normal limits. No mass or hydronephrosis visualized.  Bladder:  Appears normal for degree of bladder distention.  IMPRESSION: Normal renal ultrasound.   Electronically Signed   By: Kathreen Devoid   On: 04/27/2015 15:24    Micro Results      Recent Results (from the past 240 hour(s))  Blood culture (routine x 2)     Status: None (Preliminary result)   Collection Time: 04/27/15 12:06 PM  Result Value Ref Range Status   Specimen Description BLOOD LEFT HAND  Final   Special Requests BOTTLES DRAWN AEROBIC AND ANAEROBIC 5CC  Final   Culture NO GROWTH 1 DAY  Final   Report Status PENDING  Incomplete  Blood culture (routine x 2)     Status: None (Preliminary result)   Collection Time: 04/27/15 12:21 PM  Result Value Ref Range Status   Specimen Description BLOOD LEFT ANTECUBITAL  Final   Special Requests BOTTLES DRAWN AEROBIC AND ANAEROBIC 5CC  Final   Culture NO GROWTH 1 DAY  Final   Report Status PENDING  Incomplete  Urine culture     Status: None   Collection Time: 04/27/15 12:26 PM  Result Value Ref Range Status   Specimen Description URINE, CLEAN CATCH  Final   Special Requests NONE  Final   Culture 3,000 COLONIES/mL INSIGNIFICANT GROWTH  Final   Report Status 04/28/2015 FINAL   Final  MRSA PCR Screening     Status: None   Collection Time: 04/27/15 10:27 PM  Result Value Ref Range Status   MRSA by PCR NEGATIVE NEGATIVE Final    Comment:        The GeneXpert MRSA Assay (FDA approved for NASAL specimens only), is one component of a comprehensive MRSA colonization surveillance program. It is not intended to diagnose MRSA infection nor to guide or monitor treatment for MRSA infections.        Today   Subjective    [name] today has no headache,no chest abdominal pain,no new weakness tingling or numbness, feels much better wants to go home today.     Objective   Blood pressure 143/64, pulse 62, temperature 97.7 F (36.5 C), temperature source Oral, resp. rate 18, height 5\' 6"  (1.676 m), weight 122.97 kg (271 lb 1.6 oz), SpO2 99 %.   Intake/Output Summary (Last 24 hours) at 04/29/15 1205 Last data filed at 04/29/15 0900  Gross per 24 hour  Intake    730 ml  Output   1500 ml  Net   -770 ml    Exam Awake Alert, Oriented x 3, No new F.N deficits, Normal affect [place].AT,PERRAL Supple Neck,No JVD, No cervical lymphadenopathy appriciated.  Symmetrical Chest wall movement, Good air movement bilaterally, CTAB RRR,No Gallops,Rubs or new Murmurs, No Parasternal Heave +ve B.Sounds, Abd Soft, Non tender, No organomegaly appriciated, No rebound -guarding or  rigidity. No Cyanosis, Clubbing or edema, No new Rash or bruise, rash almost completely resolved, L foot post op scar almost completely resolved   Data Review   CBC w Diff:  Lab Results  Component Value Date   WBC 7.5 04/29/2015   WBC 16.9* 04/17/2014   HGB 8.5* 04/29/2015   HGB 12.1 04/17/2014   HCT 26.1* 04/29/2015   HCT 36.9 04/17/2014   PLT 339 04/29/2015   PLT 436* 04/17/2014   LYMPHOPCT 25 03/27/2015   LYMPHOPCT 13.2* 04/17/2014   MONOPCT 7 03/27/2015   MONOPCT 4.5 04/17/2014   EOSPCT 4 03/27/2015   EOSPCT 0.3 04/17/2014   BASOPCT 0 03/27/2015   BASOPCT 0.2 04/17/2014     CMP:  Lab Results  Component Value Date   NA 132* 04/29/2015   NA 135* 04/17/2014   K 4.0 04/29/2015   K 4.5 04/17/2014   CL 95* 04/29/2015   CO2 27 04/29/2015   CO2 26 04/17/2014   BUN 19 04/29/2015   BUN 13.9 04/17/2014   CREATININE 1.70* 04/29/2015   CREATININE 0.8 04/17/2014   CREATININE 0.60 04/19/2013   PROT 6.4* 04/28/2015   PROT 6.8 04/17/2014   ALBUMIN 2.6* 04/28/2015   ALBUMIN 3.7 04/17/2014   BILITOT 1.6* 04/28/2015   BILITOT 0.45 04/17/2014   ALKPHOS 269* 04/28/2015   ALKPHOS 82 04/17/2014   AST 22 04/28/2015   AST 8 04/17/2014   ALT 25 04/28/2015   ALT 20 04/17/2014  .   Total Time in preparing paper work, data evaluation and todays exam - 35 minutes  Thurnell Lose M.D on 04/29/2015 at 12:05 PM  [facility]   Office  [phone]

## 2015-04-30 ENCOUNTER — Telehealth: Payer: Self-pay | Admitting: Family Medicine

## 2015-04-30 ENCOUNTER — Other Ambulatory Visit: Payer: Self-pay | Admitting: *Deleted

## 2015-04-30 MED ORDER — INSULIN PEN NEEDLE 31G X 8 MM MISC: Status: DC

## 2015-04-30 NOTE — Telephone Encounter (Signed)
Heather rn  from [facility] would like something [facility] [address] on [address]. Pt was discharged from [facility] yesterday and has thrush in her mouth per heather

## 2015-04-30 NOTE — Telephone Encounter (Signed)
See below

## 2015-05-01 ENCOUNTER — Telehealth: Payer: Self-pay | Admitting: *Deleted

## 2015-05-01 MED ORDER — NYSTATIN 100000 UNIT/ML MT SUSP: OROMUCOSAL | Status: DC

## 2015-05-01 NOTE — Telephone Encounter (Signed)
Sent in nystatin swish and spit- please inform patient

## 2015-05-01 NOTE — Telephone Encounter (Signed)
Lm on pt vm with below message.

## 2015-05-01 NOTE — Telephone Encounter (Signed)
Transition Care Management Follow-up Telephone Call  How have you been since you were released from the hospital? "pretty good"   Do you understand why you were in the hospital? yes   Do you understand the discharge instrcutions? yes  Items Reviewed:  Medications reviewed: yes  Allergies reviewed: yes  Dietary changes reviewed: yes - heart healthy/ DM diet  Referrals reviewed: yes   Functional Questionnaire:   Activities of Daily Living (ADLs):   She states they are independent in the following: ambulation, bathing and hygiene, feeding, continence, grooming, toileting and dressing States they require assistance with the following: none   Any transportation issues/concerns?: no   Any patient concerns? no   Confirmed importance and date/time of follow-up visits scheduled: yes   Confirmed with patient if condition begins to worsen call PCP or go to the ER.  Patient was given the Call-a-Nurse line [phone]: yes Patient was discharged 04/29/15 Patient was discharged to her home Patient has an appointment with Dr Yong Channel 05/07/15 at 2:15 pm

## 2015-05-02 ENCOUNTER — Encounter: Payer: Self-pay | Admitting: Internal Medicine

## 2015-05-02 LAB — CULTURE, BLOOD (ROUTINE X 2)
Culture: NO GROWTH
Culture: NO GROWTH

## 2015-05-05 ENCOUNTER — Other Ambulatory Visit: Payer: Self-pay | Admitting: Family Medicine

## 2015-05-07 ENCOUNTER — Ambulatory Visit (INDEPENDENT_AMBULATORY_CARE_PROVIDER_SITE_OTHER)
Admission: RE | Admit: 2015-05-07 | Discharge: 2015-05-07 | Disposition: A | Discharge status: Home / Self Care | Payer: BLUE CROSS/BLUE SHIELD | Source: Ambulatory Visit | Attending: Family Medicine | Admitting: Family Medicine

## 2015-05-07 ENCOUNTER — Encounter: Payer: Self-pay | Admitting: Family Medicine

## 2015-05-07 ENCOUNTER — Ambulatory Visit (INDEPENDENT_AMBULATORY_CARE_PROVIDER_SITE_OTHER): Payer: BLUE CROSS/BLUE SHIELD | Admitting: Family Medicine

## 2015-05-07 VITALS — BP 138/70 | HR 77 | Temp 98.3°F | Wt 251.0 lb

## 2015-05-07 DIAGNOSIS — N179 Acute kidney failure, unspecified: Secondary | ICD-10-CM

## 2015-05-07 DIAGNOSIS — R0602 Shortness of breath: Secondary | ICD-10-CM | POA: Diagnosis not present

## 2015-05-07 DIAGNOSIS — N3000 Acute cystitis without hematuria: Secondary | ICD-10-CM | POA: Diagnosis not present

## 2015-05-07 DIAGNOSIS — L089 Local infection of the skin and subcutaneous tissue, unspecified: Secondary | ICD-10-CM

## 2015-05-07 DIAGNOSIS — D860 Sarcoidosis of lung: Secondary | ICD-10-CM

## 2015-05-07 LAB — POCT URINALYSIS DIPSTICK
Bilirubin, UA: NEGATIVE
Blood, UA: NEGATIVE
Glucose, UA: NEGATIVE
Ketones, UA: NEGATIVE
Leukocytes, UA: NEGATIVE
Nitrite, UA: NEGATIVE
Protein, UA: NEGATIVE
Spec Grav, UA: 1.01
Urobilinogen, UA: 0.2
pH, UA: 5

## 2015-05-07 LAB — COMPREHENSIVE METABOLIC PANEL
ALT: 8 U/L (ref 0–35)
AST: 11 U/L (ref 0–37)
Albumin: 4.2 g/dL (ref 3.5–5.2)
Alkaline Phosphatase: 144 U/L — ABNORMAL HIGH (ref 39–117)
BUN: 33 mg/dL — ABNORMAL HIGH (ref 6–23)
CO2: 30 mEq/L (ref 19–32)
Calcium: 9.6 mg/dL (ref 8.4–10.5)
Chloride: 95 mEq/L — ABNORMAL LOW (ref 96–112)
Creatinine, Ser: 1.68 mg/dL — ABNORMAL HIGH (ref 0.40–1.20)
GFR: 33.53 mL/min — ABNORMAL LOW (ref >60.00)
Glucose, Bld: 205 mg/dL — ABNORMAL HIGH (ref 70–99)
Potassium: 4.7 mEq/L (ref 3.5–5.1)
Sodium: 135 mEq/L (ref 135–145)
Total Bilirubin: 0.9 mg/dL (ref 0.2–1.2)
Total Protein: 7.2 g/dL (ref 6.0–8.3)

## 2015-05-07 LAB — CBC
HCT: 34.5 % — ABNORMAL LOW (ref 36.0–46.0)
Hemoglobin: 11.3 g/dL — ABNORMAL LOW (ref 12.0–15.0)
MCHC: 32.7 g/dL (ref 30.0–36.0)
MCV: 85.8 fl (ref 78.0–100.0)
Platelets: 389 10*3/uL (ref 150.0–400.0)
RBC: 4.02 Mil/uL (ref 3.87–5.11)
RDW: 14.8 % (ref 11.5–15.5)
WBC: 14.2 10*3/uL — ABNORMAL HIGH (ref 4.0–10.5)

## 2015-05-07 MED ORDER — TRIAMCINOLONE ACETONIDE 0.1 % EX CREA: 1.0000 "application " | TOPICAL_CREAM | Freq: Two times a day (BID) | CUTANEOUS | Status: DC

## 2015-05-07 NOTE — Progress Notes (Signed)
Garret Reddish, MD  Subjective:  [name] is a 54 y.o. year old very pleasant female patient who presents for transitional care management. Discharged 04/29/15. Telephone call placed 05/01/15 with patient.   Hospital follow up for worsening shortness of breath, AKI, left foot infection, concern UTI -Review of hospital discharge summary shows patient presented to the ED with progressive DOE, dry cough, chills. She was found to have bilateral interstitial thickening, bump in baseline creatinine to 1.87 and UA suspicious for UTI. This was in light of 4.5 weeks of IV vancomycin therapy for a left foot infection which started after hardware removal and ORIF for midfoot reconstruction several motnhs ago which did not heal well and subsequently developed purulent drainage. Dr. Linus Salmons had been helping care for patient outpatient in addition to Dr. Noemi Chapel of orthopedics. It was thought AKi was due to supratherapeutic vancomycin and patient was changed to zyvox oral. She had a rash on vancomycin which cleared after a few doses of IV steroids and benadryl. Renal function reportedly improved but review of #s show only modest improvement but not return to baseline. prehospitalization Cr 0.5-0.7 and at worst was 1.87 and only 1.7 at discharge. She was discharged home on 2 weeks of doxycycline.  Her breathing improved in hospital with IV lasix but this was later held due to renal concerns. She continued her home lasix after discharge as wella s coreg. Due to soft BP, losartan was held at discharge though. Renal ultrasound in hospital was normal. UA concerning for UTI but culture was negative.   She has developed rash on doxycycline over last 2-3 days similar to vancomycin rash on bilateral arms and legs. She si compliant. Her breathing remains near her baseline considering her sarcoidosis  And diastolic CHFand chronic prednisone use.   ROS- no fever, chills, nausea, vomiting, chest pain, shortness of breath beyond  baseline  Past Medical History- diastolic CHF, sarcoidosis of lung, type II DM, gout, HLD, HTN  Medications- reviewed and updated Current Outpatient Prescriptions  Medication Sig Dispense Refill  . allopurinol (ZYLOPRIM) 100 MG tablet Take 100 mg by mouth at bedtime.     [name] aspirin EC 81 MG tablet Take 81 mg by mouth daily.     [name] atorvastatin (LIPITOR) 40 MG tablet TAKE ONE TABLET BY MOUTH ONE TIME DAILY 90 tablet 3  . B-D ULTRAFINE III SHORT PEN 31G X 8 MM MISC FOLLOW DIRECTIONS PROVIDED BY PHYSICIAN 130 each 2  . buPROPion (WELLBUTRIN XL) 150 MG 24 hr tablet Take 1 tablet (150 mg total) by mouth daily. 20 tablet 0  . CARTIA XT 120 MG 24 hr capsule TAKE ONE CAPSULE BY MOUTH ONE TIME DAILY IN THE EVENING  30 capsule 11  . carvedilol (COREG) 25 MG tablet TAKE ONE TABLET BY MOUTH TWICE DAILY with a meal. 180 tablet 1  . cholecalciferol (VITAMIN D) 1000 UNITS tablet Take 1,000 Units by mouth daily.    . cholecalciferol 5000 UNITS TABS Take 1 tablet (5,000 Units total) by mouth daily. (Patient taking differently: Take 5,000 Units by mouth at bedtime. ) 30 tablet 3  . DEXILANT 60 MG capsule TAKE ONE CAPSULE BY MOUTH ONE TIME DAILY 30 capsule 6  . doxycycline (VIBRAMYCIN) 100 MG capsule Take 1 capsule (100 mg total) by mouth 2 (two) times daily. 22 capsule 0  . estradiol (ESTRACE) 2 MG tablet Take 2 mg by mouth at bedtime.     . fenofibrate (TRICOR) 145 MG tablet TAKE ONE TABLET BY MOUTH ONE  TIME DAILY  (Patient taking differently: TAKE ONE TABLET BY MOUTH every evening) 30 tablet 10  . fluticasone (FLONASE) 50 MCG/ACT nasal spray USE TWO SPRAYS IN EACH NOSTRIL DAILY 16 g 4  . furosemide (LASIX) 40 MG tablet Take 1 tablet (40 mg total) by mouth 2 (two) times daily. 60 tablet 4  . glucose blood (ONE TOUCH ULTRA TEST) test strip 1 each by Other route 3 (three) times daily. Use as instructed 100 each 5  . Insulin Glargine (TOUJEO SOLOSTAR) 300 UNIT/ML SOPN Inject 40 Units into the skin at bedtime. 3 pen  2  . Insulin Pen Needle 31G X 8 MM MISC Use to inject insulin 4 times daily. 130 each 2  . metFORMIN (GLUCOPHAGE) 1000 MG tablet TAKE ONE TABLET BY MOUTH TWICE DAILY with a meal. 180 tablet 2  . mometasone-formoterol (DULERA) 200-5 MCG/ACT AERO Inhale 2 puffs into the lungs 2 (two) times daily. 1 Inhaler 6  . NOVOLOG FLEXPEN 100 UNIT/ML FlexPen Inject 10-18 Units into the skin 3 (three) times daily with meals.   10  . nystatin (MYCOSTATIN) 100000 UNIT/ML suspension Swish in mouth then spit out  4x a day for 7 days 120 mL 0  . PARoxetine Mesylate (BRISDELLE) 7.5 MG CAPS Take 7.5 mg by mouth daily.    . potassium chloride SA (K-DUR,KLOR-CON) 20 MEQ tablet Take 1 tablet (20 mEq total) by mouth 3 (three) times daily. 90 tablet 1  . predniSONE (DELTASONE) 5 MG tablet Take 1 tablet (5 mg total) by mouth daily with breakfast. Sig 5mg s daily    . venlafaxine XR (EFFEXOR-XR) 75 MG 24 hr capsule Take 1 capsule (75 mg total) by mouth 2 (two) times daily. 20 capsule 0  . vitamin B-12 (CYANOCOBALAMIN) 1000 MCG tablet Take 1,000 mcg by mouth daily.    [name] albuterol (PROVENTIL HFA;VENTOLIN HFA) 108 (90 BASE) MCG/ACT inhaler Inhale 1-2 puffs into the lungs every 6 (six) hours as needed for wheezing or shortness of breath. For wheezing.    . diphenhydrAMINE (BENADRYL) 25 mg capsule Take 1 capsule (25 mg total) by mouth every 6 (six) hours as needed for itching. (Patient not taking: Reported on 05/07/2015) 15 capsule 0  . gabapentin (NEURONTIN) 100 MG capsule TAKE ONE CAPSULE BY MOUTH THREE TIMES DAILY (Patient not taking: Reported on 05/07/2015) 90 capsule 3  . HYDROcodone-acetaminophen (NORCO) 10-325 MG per tablet Take 1-2 tablets by mouth every 4 (four) hours as needed for moderate pain.   0  . nitroGLYCERIN (NITROSTAT) 0.4 MG SL tablet Place 0.4 mg under the tongue every 5 (five) minutes as needed. x3 doses as needed for chest pain.    [name] ondansetron (ZOFRAN-ODT) 4 MG disintegrating tablet Take 4 mg by mouth every 8  (eight) hours as needed for nausea or vomiting.   0   Objective: BP 138/70 mmHg  Pulse 77  Temp(Src) 98.3 F (36.8 C)  Wt 251 lb (113.853 kg) Gen: NAD, resting comfortably in chair CV: RRR no murmurs rubs or gallops Lungs: CTAB no crackles, wheeze, rhonchi Abdomen: soft/nontender- no suprapubic or CVA tenderness/nondistended/normal bowel sounds. No rebound or guarding. obese Ext: trace edema (unchanged) Noted scar at base of left foot- healing well externally-nontender, no purulence Skin: warm, dry Neuro: grossly normal, moves all extremities   Assessment/Plan:  Transitional care management Medical decision making- high complexity  SOB- improved back to baseline on regular steroid doses. Known chronic sarcoidosis. CXR shows clearance of interstitial thickening.   Left foot infection- appears to be healing.  Does have rash on doxycycline though and WBC elevated- reach out to Dr. Linus Salmons to see if advises any changes in outpatient regimen. All cell lines are up though and wonder if some mild dehydration (hgb jumped about 3 pts for example). Until hear back from Dr. Linus Salmons, will use triamcinolone to help patient tolerate the pruritic rash.   AKI- persistent elevation in creatinine, this may more likely be subacute change. Mild improvement only. Patient may be slightly dry and we will plan on every other week lasix for a week then continue daily dose. See me within 2-3 weeks and will repeat BMET as well as reevaluate need for losartan to be restarted as well as focus more on glycemic control.   Concern UTi- repeat UA negative  Plan 2-3 week visit. Return precautions advised.   Orders Placed This Encounter  Procedures  . DG Chest 2 View    Standing Status: Future     Number of Occurrences: 1     Standing Expiration Date: 07/07/2016    Order Specific Question:  Reason for Exam (SYMPTOM  OR DIAGNOSIS REQUIRED)    Answer:  follow up CXR- CHF    Order Specific Question:  Is the patient  pregnant?    Answer:  No    Order Specific Question:  Preferred imaging location?    Answer:  [facility]  . CBC    [place]  . Comprehensive metabolic panel    [place]  . POCT urinalysis dipstick    Meds ordered this encounter  Medications  . triamcinolone cream (KENALOG) 0.1 %    Sig: Apply 1 application topically 2 (two) times daily. 10 days maximum    Dispense:  80 g    Refill:  1

## 2015-05-07 NOTE — Patient Instructions (Signed)
Cream to help with rash- may be due to medicine but hate to change you given bone infection. Touch base with Dr. Linus Salmons next week and can consider change if rash not tolerable.   Check labs before you go  Repeat chest x-ray  Do not restart losartan yet- have Dr. Linus Salmons send me his note next week and I will check your blood pressure to restart it potentially at that time

## 2015-05-08 ENCOUNTER — Telehealth: Payer: Self-pay | Admitting: Family Medicine

## 2015-05-08 NOTE — Telephone Encounter (Signed)
SDA 15 min is fine

## 2015-05-08 NOTE — Telephone Encounter (Signed)
Pt to have a 2 week fup.  However, there are only Same day appts that entire week.  Do you want me to use 2 of those for a 30 min or is 15 ok?

## 2015-05-08 NOTE — Telephone Encounter (Signed)
Pt has been scheduled.  °

## 2015-05-09 NOTE — Progress Notes (Signed)
Thanks for the note.  I think probably she has had enough treatment and can stop and see how she does.  Thanks, Rob

## 2015-05-13 ENCOUNTER — Encounter: Payer: Self-pay | Admitting: Internal Medicine

## 2015-05-13 ENCOUNTER — Ambulatory Visit (INDEPENDENT_AMBULATORY_CARE_PROVIDER_SITE_OTHER): Payer: BLUE CROSS/BLUE SHIELD | Admitting: Internal Medicine

## 2015-05-13 VITALS — BP 117/67 | HR 77 | Temp 98.4°F | Ht 66.0 in | Wt 253.0 lb

## 2015-05-13 DIAGNOSIS — N289 Disorder of kidney and ureter, unspecified: Secondary | ICD-10-CM

## 2015-05-13 DIAGNOSIS — T847XXD Infection and inflammatory reaction due to other internal orthopedic prosthetic devices, implants and grafts, subsequent encounter: Secondary | ICD-10-CM | POA: Diagnosis not present

## 2015-05-13 NOTE — Assessment & Plan Note (Addendum)
Presumed osteomyelitis.  Looks great now.  Will have her stop the doxycyline.   Can return PRN.

## 2015-05-13 NOTE — Assessment & Plan Note (Signed)
Seems to be doing well, though remains up from baseline.

## 2015-05-13 NOTE — Progress Notes (Signed)
   Subjective:    Patient ID: [name], female    DOB: 28-May-1961, 54 y.o.   MRN: [id number]  HPI She is here for hsfu.  [name] is a 54 y.o. female with multiple medical problems who initially had a fracture of her 5th metatarsal and developed non union. Underwent ORIF with bone grafting but then fell and broke the 5th metatarsal again. She underwent hardware removal and ORIF again 3 months ago with midfoot reconstruction. The calcaneal osteotomy did not heal well and in May noted draiange, which she describes as white, pus like drainage. Developed swelling of foot and leg. She was started on Septra DS, 2 pills bid with rifampin though did not resolve. Underwent I and D in [place] with associated hardware removal but presented here to Dr. Percell Miller with persistent drainage. She underwent I and D and superficial swab with MRSA and CoNS, no bone culture done.  She was started on and has continued with vancomycin and rifampin. Unfortunately, she developed AKI and was reshospitalized.  Her creat was increased to 1.87 and vancomycin stopped and started linezolid.  Her creat remained stable, though still up from her baseline at 1.68 after her visit to Dr. Yong Channel. She was sent out on doxycyline after her insurace refused linezolid coverage.  She developed a rash with this but continued the doxycyline.  Rash is gone.  No drainage, foot is doing well, as it was in the hospital.  No longer needing to see Dr. Noemi Chapel.     Review of Systems  Constitutional: Negative for fever, chills and fatigue.  Gastrointestinal: Positive for nausea. Negative for diarrhea.       With doxycyline  Skin: Negative for rash.  Neurological: Negative for dizziness and light-headedness.       Objective:   Physical Exam  Constitutional: She appears well-developed and well-nourished. No distress.  Eyes: No scleral icterus.  Cardiovascular: Normal rate, regular rhythm and normal heart sounds.   No murmur  heard. Pulmonary/Chest: Effort normal and breath sounds normal. No respiratory distress. She has no wheezes.  Musculoskeletal:  Left foot well-healed, no warmth, no erythema, no tenderness  Skin: No rash noted.          Assessment & Plan:

## 2015-05-21 ENCOUNTER — Other Ambulatory Visit: Payer: Self-pay | Admitting: Internal Medicine

## 2015-05-21 ENCOUNTER — Telehealth: Payer: Self-pay | Admitting: Licensed Clinical Social Worker

## 2015-05-21 NOTE — Telephone Encounter (Signed)
Patient called stating that Dr. Suzan Slick sent labs that did not look favorable yesterday to Dr. Linus Salmons and he feels she should be seen. Patient states that Dr. Noemi Chapel wants her to be seen as well. I want to check to see if Dr. Linus Salmons received these labs and if the patient needs to be worked in.

## 2015-05-21 NOTE — Telephone Encounter (Signed)
I reviewed the labs.  ESR is minimally elevated, CRP up but likely due to some renal insufficiency so unclear significance.  We would be happy to see her again if there are clinical concerns but the labs themselves do not make me concerned.

## 2015-05-22 NOTE — Telephone Encounter (Signed)
Patient notified. Jacqueline Cockerham CMA  

## 2015-05-26 ENCOUNTER — Telehealth: Payer: Self-pay | Admitting: *Deleted

## 2015-05-26 NOTE — Telephone Encounter (Signed)
Patient called to advise that her foot has gotten worse. She advised it is red, swollen and painful. She advised she had labs done 05/24/15 SedRate 58 CRP 7.1 which is up from 05/15/15 Sed Rate 38 CRP 3.3. She advised she received a call from her PA at ortho Dr Elnita Maxwell and was told she was positive for MRSA and really wants to be seen. Advised she has the actual lab results from office with her. Gave her an appt for 05/28/15 to see Dr Tommy Medal and advised her to bring the results with her to the visit.

## 2015-05-28 ENCOUNTER — Ambulatory Visit (INDEPENDENT_AMBULATORY_CARE_PROVIDER_SITE_OTHER): Payer: BLUE CROSS/BLUE SHIELD | Admitting: Critical Care Medicine

## 2015-05-28 ENCOUNTER — Encounter: Payer: Self-pay | Admitting: Critical Care Medicine

## 2015-05-28 VITALS — BP 154/82 | HR 88 | Temp 98.9°F | Ht 66.0 in | Wt 249.6 lb

## 2015-05-28 DIAGNOSIS — D869 Sarcoidosis, unspecified: Secondary | ICD-10-CM | POA: Diagnosis not present

## 2015-05-28 DIAGNOSIS — J4541 Moderate persistent asthma with (acute) exacerbation: Secondary | ICD-10-CM | POA: Diagnosis not present

## 2015-05-28 DIAGNOSIS — J449 Chronic obstructive pulmonary disease, unspecified: Secondary | ICD-10-CM | POA: Diagnosis not present

## 2015-05-28 NOTE — Patient Instructions (Signed)
No change in medications. Return in            4 months Will be assigned a new MD on return

## 2015-05-28 NOTE — Progress Notes (Signed)
Subjective:    Patient ID: [name], female    DOB: 02-24-1961, 54 y.o.   MRN: [id number]  HPI 05/28/2015 Chief Complaint  Patient presents with  . Follow-up    Breathing doing well overall.  No cough, chest tightness, CP, or wheezing at this time.    Infection in left ankle MRSA.  Originally had foot surgery on the foot, secondary infection and was septic. Pt in hosp in [place], then readmit to [facility] for [facility] line and IV abx.  Home IV ABX, then allergy to ABX, kidney injury with too high level vancomycin.  Now oral abx now.  Hx per ID as below [name] is a 54 y.o. female with multiple medical problems who initially had a fracture of her 5th metatarsal and developed non union. Underwent ORIF with bone grafting but then fell and broke the 5th metatarsal again. She underwent hardware removal and ORIF again 3 months ago with midfoot reconstruction. The calcaneal osteotomy did not heal well and in May noted draiange, which she describes as white, pus like drainage. Developed swelling of foot and leg. She was started on Septra DS, 2 pills bid with rifampin though did not resolve. Underwent I and D in [place] with associated hardware removal but presented here to Dr. Percell Miller with persistent drainage. She underwent I and D and superficial swab with MRSA and CoNS, no bone culture done. She was started on and has continued with vancomycin and rifampin. Unfortunately, she developed AKI and was reshospitalized. Her creat was increased to 1.87 and vancomycin stopped and started linezolid. Her creat remained stable, though still up from her baseline at 1.68 after her visit to Dr. Yong Channel. She was sent out on doxycyline after her insurace refused linezolid coverage. She developed a rash with this but continued the doxycyline. Rash is gone. No drainage, foot is doing well, as it was in the hospital. No longer needing to see Dr. Noemi Chapel.   [name] better since being on ABX.  Pulm status  stable through all of the foot infection issues. Pt denies any significant sore throat, nasal congestion or excess secretions, fever, chills, sweats, unintended weight loss, pleurtic or exertional chest pain, orthopnea PND, or leg swelling Pt denies any increase in rescue therapy over baseline, denies waking up needing it or having any early am or nocturnal exacerbations of coughing/wheezing/or dyspnea. Pt also denies any obvious fluctuation in symptoms with  weather or environmental change or other alleviating or aggravating factors Current Medications, Allergies, Complete Past Medical History, Past Surgical History, Family History, and Social History were reviewed in [facility] record per todays encounter:  05/28/2015  Review of Systems  Constitutional: Negative.   HENT: Negative.  Negative for ear pain, postnasal drip, rhinorrhea, sinus pressure, sore throat, trouble swallowing and voice change.   Eyes: Negative.   Respiratory: Positive for cough and shortness of breath. Negative for apnea, choking, chest tightness, wheezing and stridor.   Cardiovascular: Negative.  Negative for chest pain, palpitations and leg swelling.  Gastrointestinal: Negative.  Negative for nausea, vomiting, abdominal pain and abdominal distention.  Genitourinary: Negative.   Musculoskeletal: Negative.  Negative for myalgias and arthralgias.  Skin: Negative.  Negative for rash.  Allergic/Immunologic: Negative.  Negative for environmental allergies and food allergies.  Neurological: Negative.  Negative for dizziness, syncope, weakness and headaches.  Hematological: Negative.  Negative for adenopathy. Does not bruise/bleed easily.  Psychiatric/Behavioral: Negative.  Negative for sleep disturbance and agitation. The patient is not nervous/anxious.  Objective:   Physical Exam Filed Vitals:   05/28/15 1031  BP: 154/82  Pulse: 88  Temp: 98.9 F (37.2 C)  TempSrc: Oral  Height: 5\' 6"   (1.676 m)  Weight: 249 lb 9.6 oz (113.218 kg)  SpO2: 95%    Gen: Pleasant, well-nourished, in no distress,  normal affect  ENT: No lesions,  mouth clear,  oropharynx clear, no postnasal drip  Neck: No JVD, no TMG, no carotid bruits  Lungs: No use of accessory muscles, no dullness to percussion, distant bs  Cardiovascular: RRR, heart sounds normal, no murmur or gallops, no peripheral edema  Abdomen: soft and NT, no HSM,  BS normal  Musculoskeletal: No deformities, no cyanosis or clubbing  Neuro: alert, non focal  Skin: Warm, no lesions or rashes  No results found.        Assessment & Plan:  I personally reviewed all images and lab data in the [facility] system as well as any outside material available during this office visit and agree with the  radiology impressions.   Obstructive chronic bronchitis without exacerbation Chronic obstructive bronchitis without exacerbation Plan Cont inhaled medications No other changes

## 2015-05-29 ENCOUNTER — Ambulatory Visit: Payer: Medicare Other | Admitting: Family Medicine

## 2015-05-29 ENCOUNTER — Ambulatory Visit (INDEPENDENT_AMBULATORY_CARE_PROVIDER_SITE_OTHER): Payer: BLUE CROSS/BLUE SHIELD | Admitting: Infectious Disease

## 2015-05-29 ENCOUNTER — Encounter: Payer: Self-pay | Admitting: Infectious Disease

## 2015-05-29 VITALS — BP 144/85 | HR 77 | Temp 98.2°F | Ht 66.0 in | Wt 250.0 lb

## 2015-05-29 DIAGNOSIS — Z22322 Carrier or suspected carrier of Methicillin resistant Staphylococcus aureus: Secondary | ICD-10-CM

## 2015-05-29 DIAGNOSIS — E1141 Type 2 diabetes mellitus with diabetic mononeuropathy: Secondary | ICD-10-CM

## 2015-05-29 DIAGNOSIS — T847XXD Infection and inflammatory reaction due to other internal orthopedic prosthetic devices, implants and grafts, subsequent encounter: Secondary | ICD-10-CM

## 2015-05-29 DIAGNOSIS — L089 Local infection of the skin and subcutaneous tissue, unspecified: Secondary | ICD-10-CM

## 2015-05-29 DIAGNOSIS — R21 Rash and other nonspecific skin eruption: Secondary | ICD-10-CM

## 2015-05-29 DIAGNOSIS — D860 Sarcoidosis of lung: Secondary | ICD-10-CM

## 2015-05-29 DIAGNOSIS — E1169 Type 2 diabetes mellitus with other specified complication: Secondary | ICD-10-CM

## 2015-05-29 DIAGNOSIS — N179 Acute kidney failure, unspecified: Secondary | ICD-10-CM

## 2015-05-29 DIAGNOSIS — M86172 Other acute osteomyelitis, left ankle and foot: Secondary | ICD-10-CM

## 2015-05-29 DIAGNOSIS — N183 Chronic kidney disease, stage 3 unspecified: Secondary | ICD-10-CM

## 2015-05-29 DIAGNOSIS — E1149 Type 2 diabetes mellitus with other diabetic neurological complication: Secondary | ICD-10-CM

## 2015-05-29 DIAGNOSIS — M9080 Osteopathy in diseases classified elsewhere, unspecified site: Secondary | ICD-10-CM

## 2015-05-29 DIAGNOSIS — E1165 Type 2 diabetes mellitus with hyperglycemia: Secondary | ICD-10-CM

## 2015-05-29 DIAGNOSIS — M869 Osteomyelitis, unspecified: Secondary | ICD-10-CM

## 2015-05-29 DIAGNOSIS — IMO0002 Reserved for concepts with insufficient information to code with codable children: Secondary | ICD-10-CM

## 2015-05-29 HISTORY — DX: Osteomyelitis, unspecified: M86.9

## 2015-05-29 HISTORY — DX: Type 2 diabetes mellitus with other specified complication: E11.69

## 2015-05-29 NOTE — Progress Notes (Signed)
Subjective:    Patient ID: [name], female    DOB: Jul 27, 1961, 54 y.o.   MRN: [id number]  HPI  54 y.o. female with multiple medical problems who initially had a fracture of her 5th metatarsal and developed non union. Underwent ORIF with bone grafting but then fell and broke the 5th metatarsal again. She underwent hardware removal and ORIF again 3 months ago with midfoot reconstruction. The calcaneal osteotomy did not heal well and in May noted draiange, which she describes as white, pus like drainage. Developed swelling of foot and leg. She was started on Septra DS, 2 pills bid with rifampin though did not resolve. Underwent I and D in [place] with associated hardware removal but presented here to Dr. Percell Miller with persistent drainage. She underwent I and D and superficial swab with MRSA and CoNS, no bone culture done. She was started on and has continued with vancomycin and rifampin. Unfortunately, she developed AKI and was reshospitalized. Her creat was increased to 1.87 and vancomycin stopped and started linezolid. Her creat remained stable, though still up from her baseline at 1.68 after her visit to Dr. Yong Channel. She was sent out on doxycyline after her insurace refused linezolid coverage. She developed a rash with this but continued the doxycyline, until seen by Dr. Linus Salmons roughly 16 days ago. She was taken off doxycycline but since has seen Dr. Para March who is concerned that the patient had worsening left heel pain which is hard for her now to bear. Patient had labs done which showed an elevated sedimentation rate above 50. 4 to layer creatinine had now improved to 1.35. See labs of lobe picture.  She's been restarted on doxycycline and has had re-emergence of her rash is intensely pruritic  Labs from Dr Noemi Chapel:        Review of Systems  Constitutional: Negative for fever, chills, diaphoresis, activity change, appetite change, fatigue and unexpected weight change.  HENT:  Negative for congestion, rhinorrhea, sinus pressure, sneezing, sore throat and trouble swallowing.   Eyes: Negative for photophobia and visual disturbance.  Respiratory: Negative for cough, chest tightness, shortness of breath, wheezing and stridor.   Cardiovascular: Negative for chest pain, palpitations and leg swelling.  Gastrointestinal: Negative for nausea, vomiting, abdominal pain, diarrhea, constipation, blood in stool, abdominal distention and anal bleeding.  Genitourinary: Negative for dysuria, hematuria, flank pain and difficulty urinating.  Musculoskeletal: Positive for arthralgias. Negative for myalgias, back pain, joint swelling and gait problem.  Skin: Positive for rash. Negative for color change, pallor and wound.  Neurological: Negative for dizziness, tremors, weakness and light-headedness.  Hematological: Negative for adenopathy. Does not bruise/bleed easily.  Psychiatric/Behavioral: Negative for behavioral problems, confusion, sleep disturbance, dysphoric mood, decreased concentration and agitation.       Objective:   Physical Exam  Constitutional: She is oriented to person, place, and time. She appears well-developed and well-nourished. No distress.  HENT:  Head: Normocephalic and atraumatic.  Mouth/Throat: No oropharyngeal exudate.  Eyes: Conjunctivae and EOM are normal. No scleral icterus.  Neck: Normal range of motion. Neck supple.  Cardiovascular: Normal rate and regular rhythm.   Pulmonary/Chest: Effort normal. No respiratory distress. She has no wheezes.  Abdominal: She exhibits no distension.  Musculoskeletal: She exhibits no edema or tenderness.  Neurological: She is alert and oriented to person, place, and time. She exhibits normal muscle tone. Coordination normal.  Skin: Skin is warm and dry. Rash noted. She is not diaphoretic. No erythema.  Psychiatric: She has a normal mood and  affect. Her behavior is normal. Judgment and thought content normal.     Left  foot 05/29/2015:           Assessment & Plan:   54 year old with calcaneal osteomyelitis and metatarsal infection originally due to fracture also with diabetes mellitus. Above infection was calm permitted by presence of hardware. She's been treated with anti-biotics as described above but never did we have a clear-cut culprit isolated from bone culture. I find her increasing pain in her increasing sedimentation rate to be worrisome. I think she needs to have an expedited MRI of this left foot to see what is going on in her foot in particular with her calcaneal site.  Expect she may need further debridement by orthopedic surgery, at which time we can get deep cultures from the bone to help guide IV therapy.  I do fear that she may ultimately require a below the knee amputation given the fact that she has calcaneal osteomyelitis. Her circulation itself does not seem to be that poor but calcaneal osteomyelitis is no torus a difficult to treat.  I'll have her stop her doxycycline in anticipation that she may end up having surgical debridement where we can get deep cultures.  Rash with doxycycline: Again were stop the doxycycline at this point in time.  AKI from vancomycin resolved cr 1.38 with Dr Archie Endo labs  I spent greater than 40 minutes with the patient including greater than 50% of time in face to face counsel of the patient regarding her diabetic foot infection calcaneal osteomyelitis metatarsal infection MRSA colonization and rash with doxycycline as well as acute can kidney injury from vancomycin and in coordination of their care.

## 2015-05-30 ENCOUNTER — Telehealth: Payer: Self-pay | Admitting: *Deleted

## 2015-05-30 NOTE — Assessment & Plan Note (Signed)
Chronic obstructive bronchitis without exacerbation Plan Cont inhaled medications No other changes

## 2015-05-30 NOTE — Telephone Encounter (Signed)
Thanks Denise! 

## 2015-05-30 NOTE — Telephone Encounter (Signed)
MRI has been precerted, ready for scheduling.  [facility] scheduling number given, [phone], so that the pt may make her own MRI appt.  Pt requested to be called with the results once they are available.  [phone].

## 2015-05-30 NOTE — Telephone Encounter (Addendum)
Pt needing assistance with obtaining appt.  Pt given an appt at [facility] location rather than [facility].  Pt was given appt at [facility] due to earlier time frame.  Pt able to go on vacation due to MRI this weekend.

## 2015-06-02 ENCOUNTER — Ambulatory Visit: Payer: BLUE CROSS/BLUE SHIELD | Admitting: Internal Medicine

## 2015-06-03 ENCOUNTER — Ambulatory Visit
Admission: RE | Admit: 2015-06-03 | Discharge: 2015-06-03 | Disposition: A | Discharge status: Home / Self Care | Payer: BLUE CROSS/BLUE SHIELD | Source: Ambulatory Visit | Attending: Infectious Disease | Admitting: Infectious Disease

## 2015-06-03 DIAGNOSIS — M86172 Other acute osteomyelitis, left ankle and foot: Secondary | ICD-10-CM

## 2015-06-03 MED ORDER — GADOBENATE DIMEGLUMINE 529 MG/ML IV SOLN
10.0000 mL | Freq: Once | INTRAVENOUS | Status: AC | PRN
  Administered 2015-06-03: 10 mL via INTRAVENOUS

## 2015-06-04 ENCOUNTER — Telehealth: Payer: Self-pay | Admitting: *Deleted

## 2015-06-04 NOTE — Telephone Encounter (Signed)
Questions re: MRI results(06/03/15) and ABX plan?  Would appreciate a call from Dr. Tommy Medal. [phone])  Pt and husband have a planned trip and want to know if they should change their plans based on the MRI results.

## 2015-06-05 ENCOUNTER — Ambulatory Visit: Payer: Medicare Other | Admitting: Family Medicine

## 2015-06-05 NOTE — Telephone Encounter (Signed)
I called husband back plan is as follows:  Pt needs to be seen by Dr. Noemi Chapel to assess need for further debridement  IF Dr. Noemi Chapel goes back in with surgery for osteotomy vs BKA (doubt he would do latter yet) THEN I would like her to stay OFF abx to allow [place] to obtain fresh deep cultures  IF she is not going to have surgery she is to resume her oral doxy  Husband is going to call Dr Archie Endo office.  Please make sure Dr Archie Endo office has copy of my note and MRI report

## 2015-06-06 NOTE — Telephone Encounter (Signed)
Confirmed that Dr. Archie Endo office received your note and MRI results.

## 2015-06-19 ENCOUNTER — Other Ambulatory Visit (HOSPITAL_COMMUNITY): Payer: Self-pay | Admitting: Orthopedic Surgery

## 2015-06-24 ENCOUNTER — Ambulatory Visit: Payer: BLUE CROSS/BLUE SHIELD | Admitting: Internal Medicine

## 2015-06-25 ENCOUNTER — Encounter (HOSPITAL_COMMUNITY): Payer: Self-pay

## 2015-06-25 ENCOUNTER — Encounter (HOSPITAL_COMMUNITY)
Admission: RE | Admit: 2015-06-25 | Discharge: 2015-06-25 | Disposition: A | Discharge status: Home / Self Care | Payer: BLUE CROSS/BLUE SHIELD | Source: Ambulatory Visit | Attending: Orthopedic Surgery | Admitting: Orthopedic Surgery

## 2015-06-25 DIAGNOSIS — K219 Gastro-esophageal reflux disease without esophagitis: Secondary | ICD-10-CM

## 2015-06-25 DIAGNOSIS — Z7952 Long term (current) use of systemic steroids: Secondary | ICD-10-CM | POA: Insufficient documentation

## 2015-06-25 DIAGNOSIS — E119 Type 2 diabetes mellitus without complications: Secondary | ICD-10-CM | POA: Insufficient documentation

## 2015-06-25 DIAGNOSIS — Z79899 Other long term (current) drug therapy: Secondary | ICD-10-CM

## 2015-06-25 DIAGNOSIS — Z01812 Encounter for preprocedural laboratory examination: Secondary | ICD-10-CM | POA: Insufficient documentation

## 2015-06-25 DIAGNOSIS — D860 Sarcoidosis of lung: Secondary | ICD-10-CM

## 2015-06-25 DIAGNOSIS — I10 Essential (primary) hypertension: Secondary | ICD-10-CM

## 2015-06-25 DIAGNOSIS — J449 Chronic obstructive pulmonary disease, unspecified: Secondary | ICD-10-CM | POA: Insufficient documentation

## 2015-06-25 DIAGNOSIS — I5032 Chronic diastolic (congestive) heart failure: Secondary | ICD-10-CM | POA: Diagnosis not present

## 2015-06-25 DIAGNOSIS — E785 Hyperlipidemia, unspecified: Secondary | ICD-10-CM

## 2015-06-25 DIAGNOSIS — M869 Osteomyelitis, unspecified: Secondary | ICD-10-CM | POA: Insufficient documentation

## 2015-06-25 DIAGNOSIS — Z7982 Long term (current) use of aspirin: Secondary | ICD-10-CM | POA: Insufficient documentation

## 2015-06-25 DIAGNOSIS — Z01818 Encounter for other preprocedural examination: Secondary | ICD-10-CM | POA: Insufficient documentation

## 2015-06-25 DIAGNOSIS — M868X7 Other osteomyelitis, ankle and foot: Secondary | ICD-10-CM | POA: Diagnosis not present

## 2015-06-25 HISTORY — DX: Methicillin resistant Staphylococcus aureus infection, unspecified site: A49.02

## 2015-06-25 HISTORY — DX: Other specified postprocedural states: Z98.890

## 2015-06-25 HISTORY — DX: Personal history of other diseases of the digestive system: Z87.19

## 2015-06-25 HISTORY — DX: Presence of dental prosthetic device (complete) (partial): Z97.2

## 2015-06-25 LAB — PROTIME-INR
INR: 1 (ref 0.00–1.49)
Prothrombin Time: 13.4 seconds (ref 11.6–15.2)

## 2015-06-25 LAB — COMPREHENSIVE METABOLIC PANEL
ALT: 10 U/L — ABNORMAL LOW (ref 14–54)
AST: 17 U/L (ref 15–41)
Albumin: 3.4 g/dL — ABNORMAL LOW (ref 3.5–5.0)
Alkaline Phosphatase: 85 U/L (ref 38–126)
Anion gap: 11 (ref 5–15)
BUN: 16 mg/dL (ref 6–20)
CO2: 28 mmol/L (ref 22–32)
Calcium: 8.7 mg/dL — ABNORMAL LOW (ref 8.9–10.3)
Chloride: 101 mmol/L (ref 101–111)
Creatinine, Ser: 0.94 mg/dL (ref 0.44–1.00)
GFR calc Af Amer: >60 mL/min (ref >60)
GFR calc non Af Amer: >60 mL/min (ref >60)
Glucose, Bld: 141 mg/dL — ABNORMAL HIGH (ref 65–99)
Potassium: 4 mmol/L (ref 3.5–5.1)
Sodium: 140 mmol/L (ref 135–145)
Total Bilirubin: 0.5 mg/dL (ref 0.3–1.2)
Total Protein: 6.7 g/dL (ref 6.5–8.1)

## 2015-06-25 LAB — CBC
HCT: 31.4 % — ABNORMAL LOW (ref 36.0–46.0)
Hemoglobin: 10.2 g/dL — ABNORMAL LOW (ref 12.0–15.0)
MCH: 29.2 pg (ref 26.0–34.0)
MCHC: 32.5 g/dL (ref 30.0–36.0)
MCV: 90 fL (ref 78.0–100.0)
Platelets: 337 10*3/uL (ref 150–400)
RBC: 3.49 MIL/uL — ABNORMAL LOW (ref 3.87–5.11)
RDW: 15.3 % (ref 11.5–15.5)
WBC: 12.3 10*3/uL — ABNORMAL HIGH (ref 4.0–10.5)

## 2015-06-25 LAB — GLUCOSE, CAPILLARY: Glucose-Capillary: 187 mg/dL — ABNORMAL HIGH (ref 65–99)

## 2015-06-25 LAB — APTT: aPTT: 26 seconds (ref 24–37)

## 2015-06-25 NOTE — Pre-Procedure Instructions (Addendum)
[name]  06/25/2015      [name] [name], [name] [address] [place] [address] Phone: [phone] Fax: [phone]  [name] [name] [name] [name] [name] [address] Phone: [phone] Fax: [phone]    Your procedure is scheduled on Friday June 27, 2015 at 130pm.  Report to [facility] at [facility] A.M.  Call this number if you have problems the morning of surgery:  [phone]   Remember:  Do not eat food or drink liquids after midnight Thursday September 15.  Take these medicines the morning of surgery with A SIP OF WATER Wellbutrin (bupropion), Carvedilol (Coreg), Gabapentin (Neurontin), Brisdelle (paroxetine mesylate), Prednisone (Deltasone), Venlafaxine XR (Effexor). IF needed: albuterol inhaler (Can bring with you) and pain pill (Norco tablet)  DO NOT take metformin the morning of surgery.   How to Manage Your Diabetes Before Surgery   Why is it important to control my blood sugar before and after surgery?   Improving blood sugar levels before and after surgery helps healing and can limit problems.  A way of improving blood sugar control is eating a healthy diet by:  - Eating less sugar and carbohydrates  - Increasing activity/exercise  - Talk with your doctor about reaching your blood sugar goals  High blood sugars (greater than 180 mg/dL) can raise your risk of infections and slow down your recovery so you will need to focus on controlling your diabetes during the weeks before surgery.  Make sure that the doctor who takes care of your diabetes knows about your planned surgery including the date and location.  How do I manage my blood sugars before surgery?   Check your blood sugar at least 4 times a day, 2 days before surgery to make sure that they are not too high or low.   Check your blood sugar the morning of your surgery  when you wake up and every 2               hours until you get to the Short-Stay unit.  If your blood sugar is less than 70 mg/dL, you will need to treat for low blood sugar by:  Treat a low blood sugar (less than 70 mg/dL) with 1/2 cup of clear juice (cranberry or apple), 4 glucose tablets, OR glucose gel.  Recheck blood sugar in 15 minutes after treatment (to make sure it is greater than 70 mg/dL).  If blood sugar is not greater than 70 mg/dL on re-check, call [phone] for further instructions.   Report your blood sugar to the Short-Stay nurse when you get to Short-Stay.  References:  [employer] [facility], 2007 "How to Manage your Diabetes Before and After Surgery".  What do I do about my diabetes medications?   Do not take oral diabetes medicines (pills) the morning of surgery (METFORMIN)  THE NIGHT BEFORE SURGERY, take 32 units of Toujeo (Insulin Glargine)  DO NOT take your bedtime dose of Novolog (insulin aspart).    Do not take other diabetes injectables the day of surgery including Byetta, Victoza, Bydureon, and Trulicity.    If your CBG is greater than 220 mg/dL, you may take 1/2 of your sliding scale (correction) dose of insulin.        Do not wear jewelry, make-up or nail polish.  Do not wear lotions, powders, or perfumes.  You may wear deodorant.  Do not shave 48 hours prior to surgery.  Men may shave face and neck.  Do not bring valuables to the hospital.  [facility] is not responsible for any belongings or valuables.  Contacts, dentures or bridgework may not be worn into surgery.  Leave your suitcase in the car.  After surgery it may be brought to your room.  For patients admitted to the hospital, discharge time will be determined by your treatment team.  Patients discharged the day of surgery will not be allowed to drive home.   Special instructions:  Please follow these instructions carefully:  1. Shower with CHG Soap  the night before surgery and the morning of Surgery. 2. If you choose to wash your hair, wash your hair first as usual with your normal shampoo. 3. After you shampoo, rinse your hair and body thoroughly to remove the Shampoo. 4. Use CHG as you would any other liquid soap. You can apply chg directly to the skin and wash gently with scrungie or a clean washcloth. 5. Apply the CHG Soap to your body ONLY FROM THE NECK DOWN. Do not use on open wounds or open sores. Avoid contact with your eyes, ears, mouth and genitals (private parts). Wash genitals (private parts) with your normal soap. 6. Wash thoroughly, paying special attention to the area where your surgery will be performed. 7. Thoroughly rinse your body with warm water from the neck down. 8. DO NOT shower/wash with your normal soap after using and rinsing off the CHG Soap. 9. Pat yourself dry with a clean towel.  10. Wear clean pajamas.  11. Place clean sheets on your bed the night of your first shower and do not sleep with pets.  Day of Surgery  Do not apply any lotions/deodorants the morning of surgery. Please wear clean clothes to the hospital/surgery center.    Please read over the following fact sheets that you were given. Pain Booklet, Coughing and Deep Breathing and Surgical Site Infection Prevention

## 2015-06-25 NOTE — Progress Notes (Signed)
Patient sees Dr. Ellyn Hack for cardiology; Dr. Cruzita Lederer for diabetes; and PCP is Garret Reddish, MD.  Dr. Joya Gaskins for pulmonology.     ECHO: Nov 2015  Stress: May 2014  Cath: July 2014  EKG: 04/28/15  Admitted in the hospital this July Myra Gianotti, [place] aware).   [name]

## 2015-06-26 NOTE — Progress Notes (Signed)
Anesthesia Chart Review: Patient is a 54 year old female scheduled for partial excision left calcaneus, place antibiotic beads, and wound VAC on 06/27/15 by Dr. Sharol Given. DX: Osteomyelitis, left calcaneus.   History includes non-smoker, DM2, HLD, anemia, HTN, GERD, chest pain (normal coronaries 2014), pulmonary sarcoidosis, vocal cord dysfunction, chronic diastolic CHF, COPD, depression, VHR, cholecystectomy, hysterectomy, Nissen fundoplication, s/p ORIF left foot 5th metatarsal nonunion '15. She was admitted 7/17/-16-7/19/16 for left foot infection, SOB (likely acute on chronic diastolic CHF) in the setting of acute renal failure (Cr 1.87, up from 0.72) due to supratherapeutic vancomycin and dehydration. She was seen on 05/28/15 by her pulmonologist and was doing well without exacerbation. She is not on home O2. BMI is consistent with morbid obesity.   PCP is Dr. Garret Reddish. Pulmonologist is Dr. Asencion Noble. Endocrinologist is Dr. Cruzita Lederer. Cardiologist is Dr. Ellyn Hack. ID is Dr. Tommy Medal.  Meds include albuterol, allopurinol, ASA 81mg , Lipitor, Wellbutrin XL, Cartia XT, Coreg, Dexilant, Estrace, Tricor, Flonase, Lasix, prednisone, gabapentin, Norco, Toujeo, metformin, Dulera, Nitro, Zofran-ODT, Brisdelle, KCl, Effexor-XR.[name]  04/27/15 EKG: NSR, non-specific T wave abnormality, poor data quality. No significant change since last tracing.  09/02/14 Echo: Normal LV size with mild LV hypertrophy. EF 55-60%. Wall motion was normal; there were no regional wall motion abnormalities. Grade 1 diastolic dysfunction. Normal RVsize and systolic function. No significant valvularabnormalities.  04/23/13 Cardiac cath: POST-OPERATIVE DIAGNOSIS:   Angiographically normal coronary arteries  Mild secondary pulmonary HTN with elevated LVEDP  Mildly reduced Cardiac Output / Index  03/09/13 Nuclear stress test:  Overall Impression: Low risk stress nuclear study with extensive breast attenaution,but no evidence of  ischemia or infarction. LV Wall Motion: Low NL LV Function; NL Wall Motion. LVEF 50%.  03/25/14 Spirometry: FVC 2.09 (60%), FEV1 2.63 (59%), FEV1/FVC 78% (99%), FEF25-75% 1.51 (54%). Moderate restriction.  05/07/15 CXR: IMPRESSION: There is no active cardiopulmonary disease.  Preoperative labs noted. Cr 0.94, glucose 141. WBC 12.3. H/H 10.2/31.4. A1C 04/28/15 was 8.2. She reported fasting CBG ~ 130.   She is on daily prednisone. Renal function has returned to baseline. Per PAT RN, patient denied acute cardiopulmonary issues. If no acute changes then I would anticipate that she could proceed as planned.  [facility] Short Stay Center/Anesthesiology Phone [phone] 06/26/2015 10:40 AM

## 2015-06-27 ENCOUNTER — Encounter (HOSPITAL_COMMUNITY)
Admission: RE | Disposition: A | Discharge status: Home Health | Payer: Self-pay | Source: Ambulatory Visit | Attending: Orthopedic Surgery

## 2015-06-27 ENCOUNTER — Ambulatory Visit (HOSPITAL_COMMUNITY)
Admission: RE | Admit: 2015-06-27 | Discharge: 2015-06-30 | Disposition: A | Discharge status: Home Health | Payer: BLUE CROSS/BLUE SHIELD | Source: Ambulatory Visit | Attending: Orthopedic Surgery | Admitting: Orthopedic Surgery

## 2015-06-27 ENCOUNTER — Ambulatory Visit (HOSPITAL_COMMUNITY): Payer: BLUE CROSS/BLUE SHIELD | Admitting: Anesthesiology

## 2015-06-27 ENCOUNTER — Ambulatory Visit (HOSPITAL_COMMUNITY): Payer: BLUE CROSS/BLUE SHIELD | Admitting: Vascular Surgery

## 2015-06-27 DIAGNOSIS — I509 Heart failure, unspecified: Secondary | ICD-10-CM | POA: Diagnosis not present

## 2015-06-27 DIAGNOSIS — E785 Hyperlipidemia, unspecified: Secondary | ICD-10-CM | POA: Insufficient documentation

## 2015-06-27 DIAGNOSIS — J449 Chronic obstructive pulmonary disease, unspecified: Secondary | ICD-10-CM | POA: Insufficient documentation

## 2015-06-27 DIAGNOSIS — M869 Osteomyelitis, unspecified: Secondary | ICD-10-CM

## 2015-06-27 DIAGNOSIS — I10 Essential (primary) hypertension: Secondary | ICD-10-CM | POA: Insufficient documentation

## 2015-06-27 DIAGNOSIS — M868X7 Other osteomyelitis, ankle and foot: Secondary | ICD-10-CM | POA: Insufficient documentation

## 2015-06-27 DIAGNOSIS — M86172 Other acute osteomyelitis, left ankle and foot: Secondary | ICD-10-CM | POA: Diagnosis present

## 2015-06-27 DIAGNOSIS — I5032 Chronic diastolic (congestive) heart failure: Secondary | ICD-10-CM | POA: Insufficient documentation

## 2015-06-27 HISTORY — PX: I & D EXTREMITY: SHX5045

## 2015-06-27 LAB — GLUCOSE, CAPILLARY
Glucose-Capillary: 140 mg/dL — ABNORMAL HIGH (ref 65–99)
Glucose-Capillary: 130 mg/dL — ABNORMAL HIGH (ref 65–99)
Glucose-Capillary: 201 mg/dL — ABNORMAL HIGH (ref 65–99)
Glucose-Capillary: 288 mg/dL — ABNORMAL HIGH (ref 65–99)

## 2015-06-27 LAB — SURGICAL PCR SCREEN
MRSA, PCR: NEGATIVE
Staphylococcus aureus: NEGATIVE

## 2015-06-27 SURGERY — IRRIGATION AND DEBRIDEMENT EXTREMITY
Anesthesia: General | Laterality: Left

## 2015-06-27 MED ORDER — CEFAZOLIN SODIUM-DEXTROSE 2-3 GM-% IV SOLR
INTRAVENOUS | Status: AC
  Filled 2015-06-27: qty 50

## 2015-06-27 MED ORDER — MOMETASONE FURO-FORMOTEROL FUM 200-5 MCG/ACT IN AERO
2.0000 | INHALATION_SPRAY | Freq: Two times a day (BID) | RESPIRATORY_TRACT | Status: DC
  Administered 2015-06-27 – 2015-06-30 (×6): 2 via RESPIRATORY_TRACT
  Filled 2015-06-27: qty 8.8

## 2015-06-27 MED ORDER — MEPERIDINE HCL 25 MG/ML IJ SOLN: 6.2500 mg | INTRAMUSCULAR | Status: DC | PRN

## 2015-06-27 MED ORDER — GABAPENTIN 100 MG PO CAPS
100.0000 mg | ORAL_CAPSULE | Freq: Three times a day (TID) | ORAL | Status: DC
  Administered 2015-06-27 – 2015-06-30 (×8): 100 mg via ORAL
  Filled 2015-06-27 (×8): qty 1

## 2015-06-27 MED ORDER — ESTRADIOL 2 MG PO TABS
2.0000 mg | ORAL_TABLET | Freq: Every day | ORAL | Status: DC
  Administered 2015-06-27 – 2015-06-29 (×2): 2 mg via ORAL
  Filled 2015-06-27 (×5): qty 1

## 2015-06-27 MED ORDER — MIDAZOLAM HCL 5 MG/5ML IJ SOLN
INTRAMUSCULAR | Status: DC | PRN
  Administered 2015-06-27: 2 mg via INTRAVENOUS

## 2015-06-27 MED ORDER — CARVEDILOL 25 MG PO TABS
25.0000 mg | ORAL_TABLET | Freq: Two times a day (BID) | ORAL | Status: DC
  Administered 2015-06-27 – 2015-06-30 (×6): 25 mg via ORAL
  Filled 2015-06-27 (×6): qty 1

## 2015-06-27 MED ORDER — ASPIRIN EC 81 MG PO TBEC
81.0000 mg | DELAYED_RELEASE_TABLET | Freq: Every day | ORAL | Status: DC
  Administered 2015-06-28 – 2015-06-30 (×3): 81 mg via ORAL
  Filled 2015-06-27 (×3): qty 1

## 2015-06-27 MED ORDER — LIDOCAINE HCL (CARDIAC) 20 MG/ML IV SOLN
INTRAVENOUS | Status: AC
  Filled 2015-06-27: qty 5

## 2015-06-27 MED ORDER — HYDROMORPHONE HCL 1 MG/ML IJ SOLN
1.0000 mg | INTRAMUSCULAR | Status: DC | PRN
  Administered 2015-06-28: 1 mg via INTRAVENOUS
  Filled 2015-06-27: qty 1

## 2015-06-27 MED ORDER — INSULIN ASPART 100 UNIT/ML ~~LOC~~ SOLN
0.0000 [IU] | Freq: Three times a day (TID) | SUBCUTANEOUS | Status: DC
  Administered 2015-06-27: 2 [IU] via SUBCUTANEOUS
  Administered 2015-06-28 – 2015-06-29 (×4): 3 [IU] via SUBCUTANEOUS
  Administered 2015-06-29: 2 [IU] via SUBCUTANEOUS
  Administered 2015-06-30 (×2): 3 [IU] via SUBCUTANEOUS

## 2015-06-27 MED ORDER — SODIUM CHLORIDE 0.9 % IV SOLN: INTRAVENOUS | Status: DC

## 2015-06-27 MED ORDER — FENTANYL CITRATE (PF) 250 MCG/5ML IJ SOLN
INTRAMUSCULAR | Status: AC
  Filled 2015-06-27: qty 5

## 2015-06-27 MED ORDER — GLYCOPYRROLATE 0.2 MG/ML IJ SOLN
INTRAMUSCULAR | Status: AC
  Filled 2015-06-27: qty 4

## 2015-06-27 MED ORDER — ACETAMINOPHEN 325 MG PO TABS: 650.0000 mg | ORAL_TABLET | Freq: Four times a day (QID) | ORAL | Status: DC | PRN

## 2015-06-27 MED ORDER — DILTIAZEM HCL ER COATED BEADS 120 MG PO CP24
120.0000 mg | ORAL_CAPSULE | Freq: Every day | ORAL | Status: DC
  Administered 2015-06-28 – 2015-06-30 (×3): 120 mg via ORAL
  Filled 2015-06-27 (×3): qty 1

## 2015-06-27 MED ORDER — HYDROCODONE-ACETAMINOPHEN 5-325 MG PO TABS: 1.0000 | ORAL_TABLET | Freq: Four times a day (QID) | ORAL | Status: DC | PRN

## 2015-06-27 MED ORDER — METOCLOPRAMIDE HCL 5 MG PO TABS: 5.0000 mg | ORAL_TABLET | Freq: Three times a day (TID) | ORAL | Status: DC | PRN

## 2015-06-27 MED ORDER — HYDROMORPHONE HCL 1 MG/ML IJ SOLN
0.2500 mg | INTRAMUSCULAR | Status: DC | PRN
  Administered 2015-06-27 (×4): 0.5 mg via INTRAVENOUS

## 2015-06-27 MED ORDER — FLUTICASONE PROPIONATE 50 MCG/ACT NA SUSP
2.0000 | Freq: Every day | NASAL | Status: DC
  Administered 2015-06-28 – 2015-06-29 (×2): 2 via NASAL
  Filled 2015-06-27: qty 16

## 2015-06-27 MED ORDER — CEFAZOLIN SODIUM-DEXTROSE 2-3 GM-% IV SOLR
2.0000 g | INTRAVENOUS | Status: AC
  Administered 2015-06-27: 2 g via INTRAVENOUS

## 2015-06-27 MED ORDER — ONDANSETRON HCL 4 MG PO TABS: 4.0000 mg | ORAL_TABLET | Freq: Four times a day (QID) | ORAL | Status: DC | PRN

## 2015-06-27 MED ORDER — PROMETHAZINE HCL 25 MG/ML IJ SOLN: 6.2500 mg | INTRAMUSCULAR | Status: DC | PRN

## 2015-06-27 MED ORDER — VENLAFAXINE HCL ER 75 MG PO CP24
75.0000 mg | ORAL_CAPSULE | Freq: Two times a day (BID) | ORAL | Status: DC
Start: 2015-06-27 — End: 2015-06-30
  Administered 2015-06-27 – 2015-06-30 (×6): 75 mg via ORAL
  Filled 2015-06-27 (×6): qty 1

## 2015-06-27 MED ORDER — VANCOMYCIN HCL 500 MG IV SOLR
INTRAVENOUS | Status: AC
  Filled 2015-06-27: qty 500

## 2015-06-27 MED ORDER — POTASSIUM CHLORIDE CRYS ER 20 MEQ PO TBCR
20.0000 meq | EXTENDED_RELEASE_TABLET | Freq: Three times a day (TID) | ORAL | Status: DC
  Administered 2015-06-27 – 2015-06-30 (×8): 20 meq via ORAL
  Filled 2015-06-27 (×8): qty 1

## 2015-06-27 MED ORDER — DEXAMETHASONE SODIUM PHOSPHATE 10 MG/ML IJ SOLN
INTRAMUSCULAR | Status: DC | PRN
  Administered 2015-06-27: 4 mg via INTRAVENOUS

## 2015-06-27 MED ORDER — ONDANSETRON HCL 4 MG/2ML IJ SOLN
INTRAMUSCULAR | Status: DC | PRN
  Administered 2015-06-27: 4 mg via INTRAVENOUS

## 2015-06-27 MED ORDER — BUPROPION HCL ER (XL) 150 MG PO TB24
300.0000 mg | ORAL_TABLET | Freq: Every day | ORAL | Status: DC
  Administered 2015-06-28 – 2015-06-30 (×3): 300 mg via ORAL
  Filled 2015-06-27 (×3): qty 2

## 2015-06-27 MED ORDER — PROPOFOL 10 MG/ML IV BOLUS
INTRAVENOUS | Status: DC | PRN
  Administered 2015-06-27: 200 mg via INTRAVENOUS

## 2015-06-27 MED ORDER — INSULIN GLARGINE 100 UNIT/ML ~~LOC~~ SOLN
40.0000 [IU] | Freq: Every day | SUBCUTANEOUS | Status: DC
  Administered 2015-06-27 – 2015-06-29 (×3): 40 [IU] via SUBCUTANEOUS
  Filled 2015-06-27 (×4): qty 0.4

## 2015-06-27 MED ORDER — PAROXETINE MESYLATE 7.5 MG PO CAPS: 7.5000 mg | ORAL_CAPSULE | Freq: Every day | ORAL | Status: DC

## 2015-06-27 MED ORDER — LACTATED RINGERS IV SOLN
INTRAVENOUS | Status: DC
  Administered 2015-06-27: 13:00:00 via INTRAVENOUS

## 2015-06-27 MED ORDER — ONDANSETRON HCL 4 MG/2ML IJ SOLN
INTRAMUSCULAR | Status: AC
  Filled 2015-06-27: qty 2

## 2015-06-27 MED ORDER — GENTAMICIN SULFATE 40 MG/ML IJ SOLN
INTRAMUSCULAR | Status: DC | PRN
  Administered 2015-06-27: 160 mg

## 2015-06-27 MED ORDER — ROCURONIUM BROMIDE 50 MG/5ML IV SOLN
INTRAVENOUS | Status: AC
  Filled 2015-06-27: qty 1

## 2015-06-27 MED ORDER — PREDNISONE 5 MG PO TABS
5.0000 mg | ORAL_TABLET | Freq: Every day | ORAL | Status: DC
  Administered 2015-06-28 – 2015-06-30 (×3): 5 mg via ORAL
  Filled 2015-06-27 (×3): qty 1

## 2015-06-27 MED ORDER — ONDANSETRON HCL 4 MG/2ML IJ SOLN: 4.0000 mg | Freq: Four times a day (QID) | INTRAMUSCULAR | Status: DC | PRN

## 2015-06-27 MED ORDER — ALBUTEROL SULFATE (2.5 MG/3ML) 0.083% IN NEBU: 2.5000 mg | INHALATION_SOLUTION | Freq: Four times a day (QID) | RESPIRATORY_TRACT | Status: DC | PRN

## 2015-06-27 MED ORDER — MIDAZOLAM HCL 2 MG/2ML IJ SOLN
INTRAMUSCULAR | Status: AC
  Filled 2015-06-27: qty 4

## 2015-06-27 MED ORDER — DEXAMETHASONE SODIUM PHOSPHATE 10 MG/ML IJ SOLN
INTRAMUSCULAR | Status: AC
  Filled 2015-06-27: qty 1

## 2015-06-27 MED ORDER — HYDROCODONE-ACETAMINOPHEN 10-325 MG PO TABS
1.0000 | ORAL_TABLET | ORAL | Status: DC | PRN
  Administered 2015-06-27 – 2015-06-30 (×7): 2 via ORAL
  Filled 2015-06-27 (×8): qty 2

## 2015-06-27 MED ORDER — FENTANYL CITRATE (PF) 100 MCG/2ML IJ SOLN
INTRAMUSCULAR | Status: DC | PRN
  Administered 2015-06-27: 50 ug via INTRAVENOUS
  Administered 2015-06-27: 100 ug via INTRAVENOUS
  Administered 2015-06-27 (×2): 50 ug via INTRAVENOUS

## 2015-06-27 MED ORDER — HYDROCODONE-ACETAMINOPHEN 7.5-325 MG PO TABS
ORAL_TABLET | ORAL | Status: AC
  Administered 2015-06-27: 2
  Filled 2015-06-27: qty 2

## 2015-06-27 MED ORDER — PROPOFOL 10 MG/ML IV BOLUS
INTRAVENOUS | Status: AC
  Filled 2015-06-27: qty 20

## 2015-06-27 MED ORDER — PANTOPRAZOLE SODIUM 40 MG PO TBEC
40.0000 mg | DELAYED_RELEASE_TABLET | Freq: Every day | ORAL | Status: DC
  Administered 2015-06-27 – 2015-06-30 (×4): 40 mg via ORAL
  Filled 2015-06-27 (×4): qty 1

## 2015-06-27 MED ORDER — METHOCARBAMOL 1000 MG/10ML IJ SOLN: 500.0000 mg | Freq: Four times a day (QID) | INTRAVENOUS | Status: DC | PRN

## 2015-06-27 MED ORDER — INSULIN ASPART 100 UNIT/ML ~~LOC~~ SOLN
4.0000 [IU] | Freq: Three times a day (TID) | SUBCUTANEOUS | Status: DC
  Administered 2015-06-27 – 2015-06-30 (×9): 4 [IU] via SUBCUTANEOUS

## 2015-06-27 MED ORDER — METOCLOPRAMIDE HCL 5 MG/ML IJ SOLN: 5.0000 mg | Freq: Three times a day (TID) | INTRAMUSCULAR | Status: DC | PRN

## 2015-06-27 MED ORDER — FUROSEMIDE 40 MG PO TABS
40.0000 mg | ORAL_TABLET | Freq: Two times a day (BID) | ORAL | Status: DC
  Administered 2015-06-27 – 2015-06-30 (×6): 40 mg via ORAL
  Filled 2015-06-27 (×6): qty 1

## 2015-06-27 MED ORDER — HYDROMORPHONE HCL 1 MG/ML IJ SOLN
INTRAMUSCULAR | Status: AC
  Administered 2015-06-27: 0.5 mg via INTRAVENOUS
  Filled 2015-06-27: qty 1

## 2015-06-27 MED ORDER — CEFAZOLIN SODIUM-DEXTROSE 2-3 GM-% IV SOLR
2.0000 g | Freq: Four times a day (QID) | INTRAVENOUS | Status: AC
  Administered 2015-06-27 – 2015-06-28 (×3): 2 g via INTRAVENOUS
  Filled 2015-06-27 (×3): qty 50

## 2015-06-27 MED ORDER — CHLORHEXIDINE GLUCONATE 4 % EX LIQD: 60.0000 mL | Freq: Once | CUTANEOUS | Status: DC

## 2015-06-27 MED ORDER — SACCHAROMYCES BOULARDII 250 MG PO CAPS
250.0000 mg | ORAL_CAPSULE | Freq: Every day | ORAL | Status: DC
  Administered 2015-06-28 – 2015-06-30 (×3): 250 mg via ORAL
  Filled 2015-06-27 (×3): qty 1

## 2015-06-27 MED ORDER — NEOSTIGMINE METHYLSULFATE 10 MG/10ML IV SOLN
INTRAVENOUS | Status: AC
  Filled 2015-06-27: qty 1

## 2015-06-27 MED ORDER — VANCOMYCIN HCL 500 MG IV SOLR
INTRAVENOUS | Status: DC | PRN
  Administered 2015-06-27: 500 mg

## 2015-06-27 MED ORDER — METHOCARBAMOL 500 MG PO TABS
500.0000 mg | ORAL_TABLET | Freq: Four times a day (QID) | ORAL | Status: DC | PRN
  Administered 2015-06-27: 500 mg via ORAL

## 2015-06-27 MED ORDER — ALLOPURINOL 100 MG PO TABS
100.0000 mg | ORAL_TABLET | Freq: Every day | ORAL | Status: DC
  Administered 2015-06-27 – 2015-06-29 (×3): 100 mg via ORAL
  Filled 2015-06-27 (×3): qty 1

## 2015-06-27 MED ORDER — METFORMIN HCL 500 MG PO TABS
1000.0000 mg | ORAL_TABLET | Freq: Two times a day (BID) | ORAL | Status: DC
  Administered 2015-06-27 – 2015-06-30 (×6): 1000 mg via ORAL
  Filled 2015-06-27 (×8): qty 2

## 2015-06-27 MED ORDER — MUPIROCIN 2 % EX OINT
1.0000 "application " | TOPICAL_OINTMENT | Freq: Once | CUTANEOUS | Status: AC
  Administered 2015-06-27: 1 via TOPICAL
  Filled 2015-06-27: qty 22

## 2015-06-27 MED ORDER — 0.9 % SODIUM CHLORIDE (POUR BTL) OPTIME
TOPICAL | Status: DC | PRN
  Administered 2015-06-27: 1000 mL

## 2015-06-27 MED ORDER — LIDOCAINE HCL (CARDIAC) 20 MG/ML IV SOLN
INTRAVENOUS | Status: DC | PRN
  Administered 2015-06-27: 100 mg via INTRAVENOUS

## 2015-06-27 MED ORDER — METHOCARBAMOL 500 MG PO TABS
ORAL_TABLET | ORAL | Status: AC
  Administered 2015-06-27: 500 mg via ORAL
  Filled 2015-06-27: qty 1

## 2015-06-27 MED ORDER — ACETAMINOPHEN 650 MG RE SUPP: 650.0000 mg | Freq: Four times a day (QID) | RECTAL | Status: DC | PRN

## 2015-06-27 MED ORDER — GENTAMICIN SULFATE 40 MG/ML IJ SOLN
INTRAMUSCULAR | Status: AC
  Filled 2015-06-27: qty 4

## 2015-06-27 SURGICAL SUPPLY — 45 items
BLADE SURG 10 STRL SS (BLADE) IMPLANT
BLADE SURG 21 STRL SS (BLADE) ×2 IMPLANT
BNDG COHESIVE 4X5 TAN STRL (GAUZE/BANDAGES/DRESSINGS) IMPLANT
BNDG COHESIVE 6X5 TAN STRL LF (GAUZE/BANDAGES/DRESSINGS) IMPLANT
BNDG GAUZE ELAST 4 BULKY (GAUZE/BANDAGES/DRESSINGS) ×2 IMPLANT
CONT SPEC 4OZ CLIKSEAL STRL BL (MISCELLANEOUS) ×2 IMPLANT
COVER SURGICAL LIGHT HANDLE (MISCELLANEOUS) ×4 IMPLANT
DRAPE U-SHAPE 47X51 STRL (DRAPES) ×2 IMPLANT
DRSG ADAPTIC 3X8 NADH LF (GAUZE/BANDAGES/DRESSINGS) ×2 IMPLANT
DURAPREP 26ML APPLICATOR (WOUND CARE) ×2 IMPLANT
ELECT CAUTERY BLADE 6.4 (BLADE) IMPLANT
ELECT REM PT RETURN 9FT ADLT (ELECTROSURGICAL)
ELECTRODE REM PT RTRN 9FT ADLT (ELECTROSURGICAL) IMPLANT
GAUZE SPONGE 4X4 12PLY STRL (GAUZE/BANDAGES/DRESSINGS) ×2 IMPLANT
GLOVE BIOGEL PI IND STRL 7.0 (GLOVE) ×1 IMPLANT
GLOVE BIOGEL PI IND STRL 9 (GLOVE) ×1 IMPLANT
GLOVE BIOGEL PI INDICATOR 7.0 (GLOVE) ×1
GLOVE BIOGEL PI INDICATOR 9 (GLOVE) ×1
GLOVE SURG ORTHO 9.0 STRL STRW (GLOVE) ×2 IMPLANT
GOWN STRL REUS W/ TWL LRG LVL3 (GOWN DISPOSABLE) ×1 IMPLANT
GOWN STRL REUS W/ TWL XL LVL3 (GOWN DISPOSABLE) ×2 IMPLANT
GOWN STRL REUS W/TWL LRG LVL3 (GOWN DISPOSABLE) ×2
GOWN STRL REUS W/TWL XL LVL3 (GOWN DISPOSABLE) ×4
HANDPIECE INTERPULSE COAX TIP (DISPOSABLE)
KIT BASIN OR (CUSTOM PROCEDURE TRAY) ×2 IMPLANT
KIT PREVENA INCISION MGT 13 (CANNISTER) ×2 IMPLANT
KIT ROOM TURNOVER OR (KITS) ×2 IMPLANT
KIT STIMULAN RAPID CURE 5CC (Orthopedic Implant) ×2 IMPLANT
MANIFOLD NEPTUNE II (INSTRUMENTS) ×2 IMPLANT
NS IRRIG 1000ML POUR BTL (IV SOLUTION) ×2 IMPLANT
PACK ORTHO EXTREMITY (CUSTOM PROCEDURE TRAY) ×2 IMPLANT
PAD ARMBOARD 7.5X6 YLW CONV (MISCELLANEOUS) ×4 IMPLANT
SET HNDPC FAN SPRY TIP SCT (DISPOSABLE) IMPLANT
STOCKINETTE IMPERVIOUS 9X36 MD (GAUZE/BANDAGES/DRESSINGS) IMPLANT
SUCTION FRAZIER TIP 8 FR DISP (SUCTIONS) ×1
SUCTION TUBE FRAZIER 8FR DISP (SUCTIONS) ×1 IMPLANT
SUT ETHILON 2 0 PSLX (SUTURE) ×2 IMPLANT
SWAB COLLECTION DEVICE MRSA (MISCELLANEOUS) ×4 IMPLANT
TOWEL OR 17X24 6PK STRL BLUE (TOWEL DISPOSABLE) ×2 IMPLANT
TOWEL OR 17X26 10 PK STRL BLUE (TOWEL DISPOSABLE) ×2 IMPLANT
TUBE ANAEROBIC SPECIMEN COL (MISCELLANEOUS) ×2 IMPLANT
TUBE CONNECTING 12X1/4 (SUCTIONS) ×2 IMPLANT
UNDERPAD 30X30 INCONTINENT (UNDERPADS AND DIAPERS) ×2 IMPLANT
WATER STERILE IRR 1000ML POUR (IV SOLUTION) ×2 IMPLANT
YANKAUER SUCT BULB TIP NO VENT (SUCTIONS) ×2 IMPLANT

## 2015-06-27 NOTE — Anesthesia Preprocedure Evaluation (Signed)
Anesthesia Evaluation  Patient identified by MRN, date of birth, ID band Patient awake    Reviewed: Allergy & Precautions, H&P , NPO status , Patient's Chart, lab work & pertinent test results, reviewed documented beta blocker date and time   Airway Mallampati: II  TM Distance: >3 FB Neck ROM: Full    Dental no notable dental hx. (+) Upper Dentures, Lower Dentures, Dental Advisory Given   Pulmonary shortness of breath, sleep apnea , COPD,  COPD inhaler,    Pulmonary exam normal breath sounds clear to auscultation       Cardiovascular hypertension, On Medications and On Home Beta Blockers +CHF   Rhythm:Regular Rate:Normal     Neuro/Psych negative neurological ROS  negative psych ROS   GI/Hepatic Neg liver ROS, GERD  Medicated and Controlled,  Endo/Other  diabetes, Type 1, Insulin DependentMorbid obesity  Renal/GU Renal disease     Musculoskeletal   Abdominal   Peds  Hematology  (+) anemia ,   Anesthesia Other Findings   Reproductive/Obstetrics negative OB ROS                             Anesthesia Physical  Anesthesia Plan  ASA: III  Anesthesia Plan: General   Post-op Pain Management:    Induction: Intravenous  Airway Management Planned: LMA  Additional Equipment:   Intra-op Plan:   Post-operative Plan: Extubation in OR  Informed Consent: I have reviewed the patients History and Physical, chart, labs and discussed the procedure including the risks, benefits and alternatives for the proposed anesthesia with the patient or authorized representative who has indicated his/her understanding and acceptance.   Dental advisory given  Plan Discussed with: CRNA  Anesthesia Plan Comments:         Anesthesia Quick Evaluation

## 2015-06-27 NOTE — Progress Notes (Signed)
Plan for overnight observation status post partial excision of calcaneus and placement of antibiotics beads and a deep calcaneus abscess. Discharge to home with prerenal wound VAC. When the [facility] stops at 7 days she'll take the [facility] off wash with soap and water and apply dry dressing. Prescriptions on the chart for Vicodin.

## 2015-06-27 NOTE — Progress Notes (Signed)
Orthopedic Tech Progress Note Patient Details:  [name] 1960/12/20 [id number]  Ortho Devices Type of Ortho Device: CAM walker Ortho Device/Splint Interventions: Application   [name] 06/27/2015, 2:47 PM

## 2015-06-27 NOTE — H&P (Signed)
[name] is an 54 y.o. female.   Chief Complaint: Osteomyelitis left calcaneus HPI: Patient is a 54 year old woman who previously has undergone calcaneal osteotomy medial column osteotomy as well as internal fixation for a fifth metatarsal fracture. Patient developed osteomyelitis in the calcaneus she underwent hardware removal and still has persistent symptoms consistent with chronic osteomyelitis.  Past Medical History  Diagnosis Date  . DIABETES MELLITUS, TYPE II 08/21/2006  . HYPERLIPIDEMIA 08/21/2006  . GOUT 08/20/2010  . OBESITY 06/04/2009  . ANEMIA-UNSPECIFIED 09/18/2009  . HYPERTENSION 08/21/2006  . GERD 08/21/2006  . Internal hemorrhoids   . Pulmonary sarcoidosis     Followed locally by pulmonology, but also by Dr. Casper Harrison at [facility]  . Exertional chest pain     sharp, substernal, exertional  . Vocal cord dysfunction   . Chronic diastolic heart failure, NYHA class 2     LVEDP roughly 20% by cath  . CHF (congestive heart failure)   . COPD (chronic obstructive pulmonary disease)   . Depression   . Fracture of 5th metatarsal     non union  . Abnormal SPEP 04/17/2014  . Osteomyelitis of left foot 05/29/2015  . Diabetic osteomyelitis 05/29/2015  . Hx of umbilical hernia repair   . Infection of wound due to methicillin resistant Staphylococcus aureus (MRSA)   . Wears partial dentures     Past Surgical History  Procedure Laterality Date  . Ventral hernia repair    . Nissen fundoplication  8032  . Cholecystectomy  1984  . Abdominal hysterectomy    . Knee arthroscopy      right  . Tubal ligation      with reversal in 1994  . Bladder suspension  11/11/2011    Procedure: TRANSVAGINAL TAPE (TVT) PROCEDURE;  Surgeon: Olga Millers, MD;  Location: [place] [facility];  Service: Gynecology;  Laterality: N/A;  . Cystoscopy  11/11/2011    Procedure: CYSTOSCOPY;  Surgeon: Olga Millers, MD;  Location: [place] [facility];  Service: Gynecology;  Laterality: N/A;  . Doppler  echocardiography  02/12/2013    LV FUNCTION, SIZE NORMAL; MILD CONCENTRIC LVH; EST EF 55-65%; WALL MOTION NORMAL  . Lexiscan myoview  03/09/2013    EF 50%; NORMAL MYOCARDIAL PERFUSION STUDY - breast attenuation  . Cardiac catheterization  07/2010    LVEF 50-55% WITH VERY MILD GLOBAL HYPOKINESIA; ESSENTIALLY NORMAL CORONARY ARTERIES; NORMAL LV FUNCTION  . Carotids  02/18/11    CAROTID DUPLEX; VERTEBRALS ARE PATENT WITH ANTEGRADE FLOW. ICA/CCA RATIO 1.61 ON RIGHT AND 0.75 ON LEFT  . Right and left cardiac catheterization  04/23/2013    Angiographic normal coronaries; LVEDP 20 mmHg, PCWP 12-14 mmHg, RAP 12 mmHg.; Fick CO/CI 4.9/2.2  . Metatarsal osteotomy with open reduction internal fixation (orif) metatarsal with fusion Left 04/09/2014    Procedure: LEFT FOOT FRACTURE OPEN TREATMENT METATARSAL INCLUDES INTERNAL FIXATION EACH;  Surgeon: Lorn Junes, MD;  Location: [place];  Service: Orthopedics;  Laterality: Left;  . Left and right heart catheterization with coronary angiogram N/A 04/23/2013    Procedure: LEFT AND RIGHT HEART CATHETERIZATION WITH CORONARY ANGIOGRAM;  Surgeon: Leonie Man, MD;  Location: [facility] CATH LAB;  Service: Cardiovascular;  Laterality: N/A;  . Hernia repair    . Appendectomy    . Fracture surgery      Family History  Problem Relation Age of Onset  . Diabetes Father   . Heart attack Father   . Coronary artery disease Father   . Heart  failure Father   . COPD Mother   . Emphysema Mother   . Asthma Mother   . Heart failure Mother   . Sarcoidosis Maternal Uncle   . Colon cancer Neg Hx   . Lung cancer Brother   . Cancer Brother   . Diabetes Brother   . Heart attack Maternal Grandfather    Social History:  reports that she has never smoked. She has never used smokeless tobacco. She reports that she does not drink alcohol or use illicit drugs.  Allergies:  Allergies  Allergen Reactions  . Lisinopril Cough  . Methotrexate Other (See Comments)      peri-oral and buccal lesions.  . Clindamycin/Lincomycin Nausea And Vomiting and Rash    No prescriptions prior to admission    Results for orders placed or performed during the hospital encounter of 06/25/15 (from the past 48 hour(s))  Glucose, capillary     Status: Abnormal   Collection Time: 06/25/15  9:48 AM  Result Value Ref Range   Glucose-Capillary 187 (H) 65 - 99 mg/dL  APTT     Status: None   Collection Time: 06/25/15 10:56 AM  Result Value Ref Range   aPTT 26 24 - 37 seconds  CBC     Status: Abnormal   Collection Time: 06/25/15 10:56 AM  Result Value Ref Range   WBC 12.3 (H) 4.0 - 10.5 K/uL   RBC 3.49 (L) 3.87 - 5.11 MIL/uL   Hemoglobin 10.2 (L) 12.0 - 15.0 g/dL   HCT 31.4 (L) 36.0 - 46.0 %   MCV 90.0 78.0 - 100.0 fL   MCH 29.2 26.0 - 34.0 pg   MCHC 32.5 30.0 - 36.0 g/dL   RDW 15.3 11.5 - 15.5 %   Platelets 337 150 - 400 K/uL  Comprehensive metabolic panel     Status: Abnormal   Collection Time: 06/25/15 10:56 AM  Result Value Ref Range   Sodium 140 135 - 145 mmol/L   Potassium 4.0 3.5 - 5.1 mmol/L   Chloride 101 101 - 111 mmol/L   CO2 28 22 - 32 mmol/L   Glucose, Bld 141 (H) 65 - 99 mg/dL   BUN 16 6 - 20 mg/dL   Creatinine, Ser 0.94 0.44 - 1.00 mg/dL   Calcium 8.7 (L) 8.9 - 10.3 mg/dL   Total Protein 6.7 6.5 - 8.1 g/dL   Albumin 3.4 (L) 3.5 - 5.0 g/dL   AST 17 15 - 41 U/L   ALT 10 (L) 14 - 54 U/L   Alkaline Phosphatase 85 38 - 126 U/L   Total Bilirubin 0.5 0.3 - 1.2 mg/dL   GFR calc non Af Amer >60 >60 mL/min   GFR calc Af Amer >60 >60 mL/min    Comment: (NOTE) The eGFR has been calculated using the CKD EPI equation. This calculation has not been validated in all clinical situations. eGFR's persistently <60 mL/min signify possible Chronic Kidney Disease.    Anion gap 11 5 - 15  Protime-INR     Status: None   Collection Time: 06/25/15 10:56 AM  Result Value Ref Range   Prothrombin Time 13.4 11.6 - 15.2 seconds   INR 1.00 0.00 - 1.49   *Note: Due to  a large number of results and/or encounters for the requested time period, some results have not been displayed. A complete set of results can be found in Results Review.   No results found.  Review of Systems  All other systems reviewed and are negative.  There were no vitals taken for this visit. Physical Exam  On examination patient has a good pulse she has tenderness to palpation around the calcaneus. MRI scan shows increased edema along the osteotomy site which appears consistent with osteomyelitis. Patient has an elevated acute reactive markers. Assessment/Plan Assessment osteomyelitis left calcaneus.  Plan: We will plan for debridement of the calcaneal osteotomy site placement of antibiotics beads. Risk and benefits were discussed including persistent infection. Patient states she understands wishes to proceed at this time.  DUDA,MARCUS V 06/27/2015, 7:16 AM

## 2015-06-27 NOTE — Anesthesia Procedure Notes (Signed)
Procedure Name: LMA Insertion Date/Time: 06/27/2015 1:36 PM Performed by: Barkley Boards L Pre-anesthesia Checklist: Patient identified, Emergency Drugs available, Suction available and Patient being monitored Patient Re-evaluated:Patient Re-evaluated prior to inductionOxygen Delivery Method: Circle system utilized Preoxygenation: Pre-oxygenation with 100% oxygen Intubation Type: IV induction Ventilation: Mask ventilation without difficulty LMA: LMA inserted LMA Size: 4.0 Number of attempts: 1 Placement Confirmation: positive ETCO2,  CO2 detector and breath sounds checked- equal and bilateral Tube secured with: Tape Dental Injury: Teeth and Oropharynx as per pre-operative assessment

## 2015-06-27 NOTE — Transfer of Care (Signed)
Immediate Anesthesia Transfer of Care Note  Patient: [name]  Procedure(s) Performed: Procedure(s): Partial Excision Left Calcaneus, Place Antibiotic Beads, and Wound VAC (Left)  Patient Location: PACU  Anesthesia Type:General  Level of Consciousness: awake  Airway & Oxygen Therapy: Patient Spontanous Breathing and Patient connected to nasal cannula oxygen  Post-op Assessment: Report given to RN and Post -op Vital signs reviewed and stable  Post vital signs: stable  Last Vitals:  Filed Vitals:   06/27/15 1142  BP: 155/59  Pulse: 66  Temp: 36.8 C  Resp: 20    Complications: No apparent anesthesia complications

## 2015-06-27 NOTE — Op Note (Signed)
06/27/2015  2:02 PM  PATIENT:  [name]    PRE-OPERATIVE DIAGNOSIS:  Osteomyelitis Left Calcaneus  POST-OPERATIVE DIAGNOSIS:  Same  PROCEDURE:  Partial Excision Left Calcaneus, Place Antibiotic Beads, and Wound VAC  SURGEON:  DUDA,MARCUS V, MD  PHYSICIAN ASSISTANT:None ANESTHESIA:   General  PREOPERATIVE INDICATIONS:  [name] is a  54 y.o. female with a diagnosis of Osteomyelitis Left Calcaneus who failed conservative measures and elected for surgical management.    The risks benefits and alternatives were discussed with the patient preoperatively including but not limited to the risks of infection, bleeding, nerve injury, cardiopulmonary complications, the need for revision surgery, among others, and the patient was willing to proceed.  OPERATIVE IMPLANTS: Antibody beads with 500 mg vancomycin and 160 mg gentamicin  OPERATIVE FINDINGS: Deep abscess within the calcaneus cultures were obtained 2 and tissue samples were sent for cultures as well.  OPERATIVE PROCEDURE: Patient was brought to the operating room and underwent a general and aesthetic. After adequate levels anesthesia obtained patient's left lower extremity was prepped using DuraPrep draped into a sterile field. A timeout was called. Her previous oblique incision across the calcaneus was used this was carried down to bone. Subperiosteal dissection was used to identify the osteotomy of the calcaneus. Using osteotome and curettes this was debrided a curette was used to enter a deep abscess within the calcaneus. Cultures from within the bone were obtained 2. Cancellus bone from the deep abscess within the bone was sent for cultures. Antibiotics beads were then placed within the calcaneus as well as around the lateral aspect the calcaneus these included 500 mg vancomycin and 160 mg of gentamicin. The wound was irrigated throughout the case prior to placing the interbody beads. The bone was thoroughly cleansed and  irrigated. The skin was closed using 2-0 nylon. A sterile compressive dressing was applied. Patient was extubated taken to the PACU in stable condition plan for overnight observation.

## 2015-06-27 NOTE — Anesthesia Postprocedure Evaluation (Signed)
Anesthesia Post Note  Patient: [name]  Procedure(s) Performed: Procedure(s) (LRB): Partial Excision Left Calcaneus, Place Antibiotic Beads, and Wound VAC (Left)  Anesthesia type: General  Patient location: PACU  Post pain: Pain level controlled  Post assessment: Post-op Vital signs reviewed  Last Vitals: BP 124/65 mmHg  Pulse 83  Temp(Src) 36.8 C (Oral)  Resp 13  Ht 5\' 6"  (1.676 m)  Wt 250 lb (113.399 kg)  BMI 40.37 kg/m2  SpO2 98%  Post vital signs: Reviewed  Level of consciousness: sedated  Complications: No apparent anesthesia complications

## 2015-06-28 DIAGNOSIS — M868X7 Other osteomyelitis, ankle and foot: Secondary | ICD-10-CM | POA: Diagnosis not present

## 2015-06-28 LAB — GLUCOSE, CAPILLARY
Glucose-Capillary: 165 mg/dL — ABNORMAL HIGH (ref 65–99)
Glucose-Capillary: 169 mg/dL — ABNORMAL HIGH (ref 65–99)
Glucose-Capillary: 159 mg/dL — ABNORMAL HIGH (ref 65–99)
Glucose-Capillary: 189 mg/dL — ABNORMAL HIGH (ref 65–99)

## 2015-06-28 MED ORDER — DIPHENHYDRAMINE HCL 25 MG PO CAPS
25.0000 mg | ORAL_CAPSULE | Freq: Four times a day (QID) | ORAL | Status: DC | PRN
  Administered 2015-06-28: 25 mg via ORAL
  Filled 2015-06-28: qty 1

## 2015-06-28 NOTE — Care Management Note (Addendum)
Case Management Note  Patient Details  Name: [name] MRN: [id number] Date of Birth: Dec 04, 1960  Subjective/Objective:                  Partial Excision Left Calcaneus, Place Antibiotic Beads, and Wound VAC (Left)  Action/Plan: CM spoke to pt at the bedside. Wound vac in place to left foot which pt states that she will be discharged with. Pt states that she has a walker at home, BSC, and "knee scooter". Pt and Physical Therapist, Caryl Pina, state that she feels that the pt would benefit from having a wheelchair due to limited mobility. CM notes that pt will need [facility] RN for wound vac care and pt states that she has used [facility] before and would like to use them again for any HH needs/DME. Cm called TIffany with [facility] to advise of HHRN need and wound vac. Tiffany to bring wound vac form to the pt chart as well as Wheelchair approval form. Per PT note, PT will be going to outpatient PT. CM called and Spoke to Spivey with [facility] DME and advised of need for Wheelchair. He states that he will await MD order. CM advised nurse about Wound Vac information on the chart and needs to be filled out by nursing staff or MD and signed. Understanding verbalized.   Pt states that her husband will be able to provide support/supervision at home. Pt denies difficulty obtaining and affording medications. CM will monitor for additional discharge planning needs.   Expected Discharge Date:  06/29/15              Expected Discharge Plan:  [place]  In-House Referral:     Discharge planning Services  CM Consult  Post Acute Care Choice:  Durable Medical Equipment, Home Health Choice offered to:  Patient  DME Arranged:  Wheelchair manual DME Agency:  [place]:  RN [facility] Agency:  [place]  Status of Service:  In process, will continue to follow  Medicare Important Message Given:    Date Medicare IM Given:    Medicare IM give by:    Date Additional Medicare IM  Given:    Additional Medicare Important Message give by:     If discussed at [place] of Stay Meetings, dates discussed:    Additional Comments:  Guido Sander, RN 06/28/2015, 9:58 AM

## 2015-06-28 NOTE — Progress Notes (Signed)
Cm paged on call Md-Dr. Erlinda Hong for [facility] orders, wheelchair, wound vac and to advise to fill out forms on chart.

## 2015-06-28 NOTE — Progress Notes (Signed)
   Subjective:  Patient reports pain as marked.    Objective:   VITALS:   Filed Vitals:   06/27/15 2013 06/27/15 2112 06/27/15 2339 06/28/15 0553  BP: 144/71  123/54 142/60  Pulse: 91 90 91 83  Temp: 98.5 F (36.9 C)  98.4 F (36.9 C) 98.2 F (36.8 C)  TempSrc: Oral  Oral Oral  Resp: 17 18 17 18   Height:      Weight:      SpO2: 94%  95% 96%    Neurologically intact Neurovascular intact Sensation intact distally Intact pulses distally Dorsiflexion/Plantar flexion intact Incision: dressing C/D/I and no drainage No cellulitis present Compartment soft   Lab Results  Component Value Date   WBC 12.3* 06/25/2015   HGB 10.2* 06/25/2015   HCT 31.4* 06/25/2015   MCV 90.0 06/25/2015   PLT 337 06/25/2015     Assessment/Plan:  1 Day Post-Op   - patient is stable - VAC in place with moderate drainage - had to switch to [facility] o/n due to prevena overflowing - will keep patient here today to watch drainage  Marianna Payment 06/28/2015, 7:54 AM [phone]

## 2015-06-28 NOTE — Evaluation (Addendum)
Physical Therapy Evaluation Patient Details Name: [name] MRN: [id number] DOB: 1961-08-05 Today's Date: 06/28/2015   History of Present Illness  [name] a 54 y/o F who previously underwent calcaneal osteotomy w/ internal fixation for 5th metatarsal fx.  Pt developed osteomyelitis in calcaneus w/ hardware removal and continues to have chronic osteomyelitis.  [name] now s/p partial excision Lt calcaneus w/ wound vac placement.  Her PMH includes hyperlipidemia, gout, obesity, anemia, HTN, pulmonary sarcoidosis, exertional chest pain, chronic diastolic heart failure, CHF, COPD, depression.  Clinical Impression  Patient is s/p above surgery resulting in functional limitations due to the deficits listed below (see PT Problem List). [name] will need to complete stair training prior to d/c tomorrow. She has experience w/ NWB using knee walker and RW from previous surgery.  She will have assist available from husband, Rush Landmark, at night and parents who live next door during the day.  Ambulated in hallway today limited by generalized fatigue.  Pt would benefit from having WC for long distances in community.  Patient will benefit from skilled PT to increase their independence and safety with mobility to allow discharge to the venue listed below.      Follow Up Recommendations Outpatient PT;Supervision for mobility/OOB (OPPT once appropriate)    Equipment Recommendations  Wheelchair (measurements PT);Wheelchair cushion (measurements PT)    Recommendations for Other Services       Precautions / Restrictions Precautions Precautions: Fall Required Braces or Orthoses: Other Brace/Splint Other Brace/Splint: CAM boot Lt LE Restrictions Weight Bearing Restrictions: Yes LLE Weight Bearing: Non weight bearing      Mobility  Bed Mobility Overal bed mobility: Modified Independent             General bed mobility comments: HOB elevated but no use of bed rail.  Increased time but no assist or  cues needed.  Transfers Overall transfer level: Needs assistance Equipment used: Rolling walker (2 wheeled) Transfers: Sit to/from Stand Sit to Stand: Supervision         General transfer comment: Cues for proper technique for hand placement.  Pt performs transfers safely.  Ambulation/Gait Ambulation/Gait assistance: Supervision;+2 safety/equipment Ambulation Distance (Feet): 40 Feet Assistive device: Rolling walker (2 wheeled) Gait Pattern/deviations:  (hop on Rt LE)   Gait velocity interpretation: Below normal speed for age/gender General Gait Details: Standing rest break x2 2/2 generalized fatigue.  No increased pain w/ mobility.  Pt w/ safe technique using RW.  Stairs            Wheelchair Mobility    Modified Rankin (Stroke Patients Only)       Balance Overall balance assessment: Needs assistance Sitting-balance support: Feet supported;Bilateral upper extremity supported Sitting balance-Leahy Scale: Good     Standing balance support: Bilateral upper extremity supported;During functional activity Standing balance-Leahy Scale: Poor Standing balance comment: Relies on RW for support                             Pertinent Vitals/Pain Pain Assessment: 0-10 Pain Score: 0-No pain Pain Location: denies pain at this time Pain Intervention(s): Limited activity within patient's tolerance;Monitored during session;Repositioned    Home Living Family/patient expects to be discharged to:: Private residence Living Arrangements: Spouse/significant other Available Help at Discharge: Family;Available PRN/intermittently Type of Home: House Home Access: Stairs to enter Entrance Stairs-Rails: Right Entrance Stairs-Number of Steps: 2 Home Layout: One level Home Equipment: Walker - 2 wheels;Bedside commode (Knee walker) Additional Comments: Husband  works during the day but parents live next door who can assist prn    Prior Function Level of Independence:  Independent         Comments: Had surgery in June when she used the knee walker and walker for ~6 weeks.     Hand Dominance        Extremity/Trunk Assessment   Upper Extremity Assessment: Overall WFL for tasks assessed           Lower Extremity Assessment: LLE deficits/detail   LLE Deficits / Details: weakness and limited ROM s/p Lt calcaneus surgery     Communication   Communication: No difficulties  Cognition Arousal/Alertness: Awake/alert Behavior During Therapy: WFL for tasks assessed/performed Overall Cognitive Status: Within Functional Limits for tasks assessed                      General Comments General comments (skin integrity, edema, etc.): Pt used knee walker recently for 6 weeks and does not feel that she needs to practice.  However, pt will need to complete stair training at next sesion prior to D/C.  Discussed management of wound vac at home and potential for parents to assist pt if needed.    Exercises General Exercises - Lower Extremity Ankle Circles/Pumps: AROM;Both;10 reps;Supine Gluteal Sets: Strengthening;Both;10 reps;Seated Straight Leg Raises: Strengthening;Left;10 reps;Seated      Assessment/Plan    PT Assessment Patient needs continued PT services  PT Diagnosis Difficulty walking;Abnormality of gait;Acute pain   PT Problem List Decreased strength;Decreased range of motion;Decreased activity tolerance;Decreased balance;Decreased mobility;Decreased knowledge of use of DME;Decreased safety awareness;Decreased knowledge of precautions;Decreased skin integrity;Pain;Obesity  PT Treatment Interventions DME instruction;Gait training;Stair training;Functional mobility training;Therapeutic activities;Therapeutic exercise;Balance training;Neuromuscular re-education;Patient/family education;Modalities;Wheelchair mobility training   PT Goals (Current goals can be found in the Care Plan section) Acute Rehab PT Goals Patient Stated Goal: to go  home once ready PT Goal Formulation: With patient/family Time For Goal Achievement: 07/05/15 Potential to Achieve Goals: Good    Frequency Min 5X/week   Barriers to discharge Inaccessible home environment 2 steps to enter home    Co-evaluation               End of Session Equipment Utilized During Treatment: Gait belt Activity Tolerance: Patient tolerated treatment well;No increased pain;Patient limited by fatigue Patient left: in chair;with call bell/phone within reach;with family/visitor present Nurse Communication: Mobility status;Precautions;Weight bearing status    Functional Assessment Tool Used: Clinical Judgement Functional Limitation: Mobility: Walking and moving around Mobility: Walking and Moving Around Current Status (D3143): At least 1 percent but less than 20 percent impaired, limited or restricted Mobility: Walking and Moving Around Goal Status [phone]): At least 1 percent but less than 20 percent impaired, limited or restricted    Time: 7972-8206 PT Time Calculation (min) (ACUTE ONLY): 24 min   Charges:   PT Evaluation $Initial PT Evaluation Tier I: 1 Procedure PT Treatments $Gait Training: 8-22 mins   PT G Codes:   PT G-Codes **NOT FOR INPATIENT CLASS** Functional Assessment Tool Used: Clinical Judgement Functional Limitation: Mobility: Walking and moving around Mobility: Walking and Moving Around Current Status (O1561): At least 1 percent but less than 20 percent impaired, limited or restricted Mobility: Walking and Moving Around Goal Status [phone]): At least 1 percent but less than 20 percent impaired, limited or restricted   Joslyn Hy PT, DPT [phone] Pager: [phone] 06/28/2015, 10:06 AM

## 2015-06-29 DIAGNOSIS — M868X7 Other osteomyelitis, ankle and foot: Secondary | ICD-10-CM | POA: Diagnosis not present

## 2015-06-29 LAB — GLUCOSE, CAPILLARY
Glucose-Capillary: 94 mg/dL (ref 65–99)
Glucose-Capillary: 174 mg/dL — ABNORMAL HIGH (ref 65–99)
Glucose-Capillary: 150 mg/dL — ABNORMAL HIGH (ref 65–99)
Glucose-Capillary: 130 mg/dL — ABNORMAL HIGH (ref 65–99)

## 2015-06-29 NOTE — Progress Notes (Signed)
Physical Therapy Treatment Patient Details Name: [name] MRN: [id number] DOB: [birth year] Today's Date: 06/29/2015    History of Present Illness [name] a 54 y/o F who previously underwent calcaneal osteotomy w/ internal fixation for 5th metatarsal fx.  Pt developed osteomyelitis in calcaneus w/ hardware removal and continues to have chronic osteomyelitis.  [name] now s/p partial excision Lt calcaneus w/ wound vac placement.  Her PMH includes hyperlipidemia, gout, obesity, anemia, HTN, pulmonary sarcoidosis, exertional chest pain, chronic diastolic heart failure, CHF, COPD, depression.    PT Comments    Discussed safe option for ascending/descending steps to enter pt's and pt's parent's home as pt had difficulty hopping up onto step.  [name] ambulated 71 ft w/ RW today w/ min guard assist, requiring one sitting and one standing rest break.  Pt will benefit from continued skilled PT services to increase functional independence and safety.   Follow Up Recommendations  Outpatient PT;Supervision for mobility/OOB (OPPT once appropriate)     Equipment Recommendations  Wheelchair (measurements PT);Wheelchair cushion (measurements PT)    Recommendations for Other Services       Precautions / Restrictions Precautions Precautions: Fall Required Braces or Orthoses: Other Brace/Splint Other Brace/Splint: CAM boot Lt LE Restrictions Weight Bearing Restrictions: Yes LLE Weight Bearing: Non weight bearing    Mobility  Bed Mobility Overal bed mobility: Modified Independent             General bed mobility comments: HOB elevated but no use of bed rail.  Increased time but no assist or cues needed.  Transfers Overall transfer level: Needs assistance Equipment used: Rolling walker (2 wheeled) Transfers: Sit to/from Stand Sit to Stand: Supervision         General transfer comment: Pt w/ safe technique and no cues needed.  Ambulation/Gait Ambulation/Gait assistance: Min  guard Ambulation Distance (Feet): 75 Feet Assistive device: Rolling walker (2 wheeled) Gait Pattern/deviations:  (hop on Rt LE)   Gait velocity interpretation: Below normal speed for age/gender General Gait Details: Sitting rest break x1 and standing rest break x1 2/2 generalized fatigue.   Stairs Stairs: Yes       General stair comments: Pt w/ difficulty hopping up onto step and instead educated pt on safe option of placing a chair on the top step at home to sit in and then turn the chair around to get inside home.  Reverse order for exiting home.  This will apply to her home and her parent's home which both have 2 steps to enter.  Pt and pt's husband verbalize understanding.  Wheelchair Mobility    Modified Rankin (Stroke Patients Only)       Balance Overall balance assessment: Needs assistance Sitting-balance support: No upper extremity supported;Feet supported Sitting balance-Leahy Scale: Good     Standing balance support: Bilateral upper extremity supported;During functional activity Standing balance-Leahy Scale: Poor Standing balance comment: Relies on RW for support                    Cognition Arousal/Alertness: Awake/alert Behavior During Therapy: WFL for tasks assessed/performed Overall Cognitive Status: Within Functional Limits for tasks assessed                      Exercises      General Comments        Pertinent Vitals/Pain Pain Assessment: No/denies pain Pain Intervention(s): Limited activity within patient's tolerance;Monitored during session    Home Living  Prior Function            PT Goals (current goals can now be found in the care plan section) Acute Rehab PT Goals Patient Stated Goal: to go home once ready PT Goal Formulation: With patient/family Time For Goal Achievement: 07/05/15 Potential to Achieve Goals: Good Progress towards PT goals: Progressing toward goals    Frequency  Min  5X/week    PT Plan Current plan remains appropriate    Co-evaluation             End of Session Equipment Utilized During Treatment: Gait belt Activity Tolerance: Patient tolerated treatment well;No increased pain;Patient limited by fatigue Patient left: with call bell/phone within reach;with family/visitor present;in bed     Time: 2979-8921 PT Time Calculation (min) (ACUTE ONLY): 17 min  Charges:  $Gait Training: 8-22 mins                    G Codes:       [name] PT, [place] [phone] Pager: [phone] 06/29/2015, 3:16 PM

## 2015-06-29 NOTE — Progress Notes (Signed)
   Subjective:  Patient reports pain as improved.  Objective:   VITALS:   Filed Vitals:   06/28/15 1450 06/28/15 2019 06/28/15 2102 06/29/15 0533  BP: 125/51 139/61  133/53  Pulse: 82 77 81 84  Temp: 98.6 F (37 C) 98.3 F (36.8 C)  98.4 F (36.9 C)  TempSrc: Oral Oral  Oral  Resp: 18 17 20 17   Height:      Weight:      SpO2: 98% 99% 99% 97%    Neurologically intact Neurovascular intact Sensation intact distally Intact pulses distally Dorsiflexion/Plantar flexion intact Incision: dressing C/D/I and no drainage No cellulitis present Compartment soft   Lab Results  Component Value Date   WBC 12.3* 06/25/2015   HGB 10.2* 06/25/2015   HCT 31.4* 06/25/2015   MCV 90.0 06/25/2015   PLT 337 06/25/2015     Assessment/Plan:  2 Days Post-Op   - drainage continues to be significant and too much for d/c home with prevena - will keep today and reevaluate tomorrow  Marianna Payment 06/29/2015, 8:50 AM [phone]

## 2015-06-30 ENCOUNTER — Encounter (HOSPITAL_COMMUNITY): Payer: Self-pay | Admitting: Orthopedic Surgery

## 2015-06-30 ENCOUNTER — Ambulatory Visit: Payer: BLUE CROSS/BLUE SHIELD | Admitting: Infectious Disease

## 2015-06-30 DIAGNOSIS — M868X7 Other osteomyelitis, ankle and foot: Secondary | ICD-10-CM | POA: Diagnosis not present

## 2015-06-30 LAB — GLUCOSE, CAPILLARY
Glucose-Capillary: 104 mg/dL — ABNORMAL HIGH (ref 65–99)
Glucose-Capillary: 182 mg/dL — ABNORMAL HIGH (ref 65–99)
Glucose-Capillary: 104 mg/dL — ABNORMAL HIGH (ref 65–99)

## 2015-06-30 NOTE — Care Management Note (Signed)
Case Management Note  Patient Details  Name: LARON ANGELINI MRN: [id number] Date of Birth: August 29, 1961  Subjective/Objective:                    Action/Plan:   Expected Discharge Date:                  Expected Discharge Plan:  [place]  In-House Referral:     Discharge planning Services  CM Consult  Post Acute Care Choice:  Durable Medical Equipment, Home Health Choice offered to:  Patient  DME Arranged:  Wheelchair manual DME Agency:  [place]:    [place]:  [place]  Status of Service:  Completed Medicare Important Message Given:    Date Medicare IM Given:    Medicare IM give by:    Date Additional Medicare IM Given:    Additional Medicare Important Message give by:     If discussed at [place] of Stay Meetings, dates discussed:    Additional Comments:  Patient going home with Previna wound vac, it will not be disturbed for 7 days, she will followup with Dr. Sharol Given as instructed in a week.  Ninfa Meeker, RN 06/30/2015, 11:16 AM

## 2015-06-30 NOTE — Progress Notes (Signed)
Patient ID: [name], female   DOB: [birth year], 54 y.o.   MRN: [id number] No additional drainage in the wound VAC. The wound VAC was removed and the portable [facility] of VAC was applied. Patient will be discharged with the prevena VAC today. No change in her hospital course since the discharge summary was dictated. Patient will not need the home wound VAC. Once the prevena stops it suction she will start dry dressing changes at home. I will follow-up in the office in 1-2 weeks.

## 2015-06-30 NOTE — Progress Notes (Signed)
Physical Therapy Treatment Patient Details Name: [name] MRN: [id number] DOB: 15-Nov-1960 Today's Date: 06/30/2015    History of Present Illness [name] a 54 y/o F who previously underwent calcaneal osteotomy w/ internal fixation for 5th metatarsal fx.  Pt developed osteomyelitis in calcaneus w/ hardware removal and continues to have chronic osteomyelitis.  [name] now s/p partial excision Lt calcaneus w/ wound vac placement.  Her PMH includes hyperlipidemia, gout, obesity, anemia, HTN, pulmonary sarcoidosis, exertional chest pain, chronic diastolic heart failure, CHF, COPD, depression.    PT Comments    [name] is anticipating d/c today.  Discussed WC use and stair ascending/descending technique.  Pt w/ safe ambulation this session w/ min guard assist.    Follow Up Recommendations  Outpatient PT;Supervision for mobility/OOB (OPPT once appropriate)     Equipment Recommendations  Wheelchair (measurements PT);Wheelchair cushion (measurements PT)    Recommendations for Other Services       Precautions / Restrictions Precautions Precautions: Fall Required Braces or Orthoses: Other Brace/Splint Other Brace/Splint: CAM boot Lt LE Restrictions Weight Bearing Restrictions: Yes LLE Weight Bearing: Non weight bearing    Mobility  Bed Mobility               General bed mobility comments: Pt sitting EOB upon PT arrival  Transfers Overall transfer level: Needs assistance Equipment used: Rolling walker (2 wheeled) Transfers: Sit to/from Stand Sit to Stand: Supervision         General transfer comment: Pt w/ safe technique and no cues needed.  Ambulation/Gait Ambulation/Gait assistance: Min guard Ambulation Distance (Feet): 60 Feet Assistive device: Rolling walker (2 wheeled) Gait Pattern/deviations:  (hop on Rt LE)   Gait velocity interpretation: Below normal speed for age/gender General Gait Details: Standing rest break x1 2/2 generalized fatigue.  Cues to  take turns slowly for safety, pt verbalized and demonstrated understanding.   Stairs         General stair comments: Again, discussed safe technique to ascend/descend 2 steps to enter home w/ use of chair to sit in.  See previous PT note.  Information systems manager mobility:  (Discussed safety and techniques w/ WC use)  Modified Rankin (Stroke Patients Only)       Balance Overall balance assessment: Needs assistance Sitting-balance support: Bilateral upper extremity supported;Feet supported Sitting balance-Leahy Scale: Good     Standing balance support: Bilateral upper extremity supported;During functional activity Standing balance-Leahy Scale: Poor                      Cognition Arousal/Alertness: Awake/alert Behavior During Therapy: WFL for tasks assessed/performed Overall Cognitive Status: Within Functional Limits for tasks assessed                      Exercises General Exercises - Lower Extremity Ankle Circles/Pumps: AROM;Both;10 reps;Seated Long Arc Quad: AROM;Both;10 reps;Seated Hip Flexion/Marching: AROM;Both;10 reps;Seated    General Comments General comments (skin integrity, edema, etc.): Pt expresses knowledge of how to maneuver WC and discussed w/ pt the option of using WC to get inside home w/ assist of 2 but the chair technqiue discussed at last session might be easier.  Pt verbalized understanding.  Additionally discussed importance of maintaining ambulatory activity by ambulating w/ RW every hour to maintain strength.      Pertinent Vitals/Pain Pain Assessment: 0-10 Pain Score: 6  Pain Location: Lt LE Pain Descriptors / Indicators: Aching Pain Intervention(s): Limited activity within patient's tolerance;Monitored during session;Repositioned  Home Living                      Prior Function            PT Goals (current goals can now be found in the care plan section) Acute Rehab PT  Goals Patient Stated Goal: to go home once ready PT Goal Formulation: With patient/family Time For Goal Achievement: 07/05/15 Potential to Achieve Goals: Good Progress towards PT goals: Progressing toward goals    Frequency  Min 5X/week    PT Plan Current plan remains appropriate    Co-evaluation             End of Session Equipment Utilized During Treatment: Gait belt Activity Tolerance: Patient tolerated treatment well;Patient limited by fatigue Patient left: with call bell/phone within reach;in chair     Time: 0922-0937 PT Time Calculation (min) (ACUTE ONLY): 15 min  Charges:  $Gait Training: 8-22 mins                    G Codes:      Joslyn Hy PT, [place] [phone] Pager: [phone] 06/30/2015, 9:49 AM

## 2015-07-01 ENCOUNTER — Ambulatory Visit: Payer: BLUE CROSS/BLUE SHIELD | Admitting: Internal Medicine

## 2015-07-01 ENCOUNTER — Other Ambulatory Visit: Payer: Self-pay | Admitting: Internal Medicine

## 2015-07-01 ENCOUNTER — Ambulatory Visit: Payer: Medicare Other | Admitting: Family Medicine

## 2015-07-01 LAB — CULTURE, ROUTINE-ABSCESS: Culture: NO GROWTH

## 2015-07-01 LAB — TISSUE CULTURE
Culture: NO GROWTH
Gram Stain: NONE SEEN

## 2015-07-01 NOTE — Discharge Summary (Signed)
Physician Discharge Summary      Patient ID: [name] MRN: [id number] DOB/AGE: [birth year] 54 y.o.  Admit date: 06/27/2015 Discharge date: 07/01/2015  Admission Diagnoses:  <principal problem not specified>  Discharge Diagnoses:  Active Problems:   Acute osteomyelitis of left calcaneus   Past Medical History  Diagnosis Date  . DIABETES MELLITUS, TYPE II 08/21/2006  . HYPERLIPIDEMIA 08/21/2006  . GOUT 08/20/2010  . OBESITY 06/04/2009  . ANEMIA-UNSPECIFIED 09/18/2009  . HYPERTENSION 08/21/2006  . GERD 08/21/2006  . Internal hemorrhoids   . Pulmonary sarcoidosis     Followed locally by pulmonology, but also by Dr. Casper Harrison at [facility]  . Exertional chest pain     sharp, substernal, exertional  . Vocal cord dysfunction   . Chronic diastolic heart failure, NYHA class 2     LVEDP roughly 20% by cath  . CHF (congestive heart failure)   . COPD (chronic obstructive pulmonary disease)   . Depression   . Fracture of 5th metatarsal     non union  . Abnormal SPEP 04/17/2014  . Osteomyelitis of left foot 05/29/2015  . Diabetic osteomyelitis 05/29/2015  . Hx of umbilical hernia repair   . Infection of wound due to methicillin resistant Staphylococcus aureus (MRSA)   . Wears partial dentures     Surgeries: Procedure(s): Partial Excision Left Calcaneus, Place Antibiotic Beads, and Wound VAC on 06/27/2015   Consultants (if any):    Discharged Condition: Improved  Hospital Course: [name] is an 54 y.o. female who was admitted 06/27/2015 with a diagnosis of <principal problem not specified> and went to the operating room on 06/27/2015 and underwent the above named procedures.    She was given perioperative antibiotics:  Anti-infectives    Start     Dose/Rate Route Frequency Ordered Stop   06/27/15 2000  ceFAZolin (ANCEF) IVPB 2 g/50 mL premix     2 g 100 mL/hr over 30 Minutes Intravenous Every 6 hours 06/27/15 1625 06/28/15 0930   06/27/15 1355   vancomycin (VANCOCIN) powder  Status:  Discontinued       As needed 06/27/15 1405 06/27/15 1423   06/27/15 1355  gentamicin (GARAMYCIN) injection  Status:  Discontinued       As needed 06/27/15 1407 06/27/15 1423   06/27/15 1315  ceFAZolin (ANCEF) IVPB 2 g/50 mL premix     2 g 100 mL/hr over 30 Minutes Intravenous On call to O.R. 06/27/15 1222 06/27/15 1338   06/27/15 1224  ceFAZolin (ANCEF) 2-3 GM-% IVPB SOLR    CommentsVira Agar, Beth   : cabinet override      06/27/15 1224 06/28/15 0029    .  She was given sequential compression devices, early ambulation for DVT prophylaxis.  She benefited maximally from the hospital stay and there were no complications.    Recent vital signs:  Filed Vitals:   06/30/15 1001  BP:   Pulse: 72  Temp:   Resp: 18    Recent laboratory studies:  Lab Results  Component Value Date   HGB 10.2* 06/25/2015   HGB 11.3* 05/07/2015   HGB 8.5* 04/29/2015   Lab Results  Component Value Date   WBC 12.3* 06/25/2015   PLT 337 06/25/2015   Lab Results  Component Value Date   INR 1.00 06/25/2015   Lab Results  Component Value Date   NA 140 06/25/2015   K 4.0 06/25/2015   CL 101 06/25/2015   CO2 28 06/25/2015   BUN 16  06/25/2015   CREATININE 0.94 06/25/2015   GLUCOSE 141* 06/25/2015    Discharge Medications:     Medication List    TAKE these medications        albuterol 108 (90 BASE) MCG/ACT inhaler  Commonly known as:  PROVENTIL HFA;VENTOLIN HFA  Inhale 1-2 puffs into the lungs every 6 (six) hours as needed for wheezing or shortness of breath. For wheezing.     allopurinol 100 MG tablet  Commonly known as:  ZYLOPRIM  Take 100 mg by mouth at bedtime.     aspirin EC 81 MG tablet  Take 81 mg by mouth daily.     atorvastatin 40 MG tablet  Commonly known as:  LIPITOR  TAKE ONE TABLET BY MOUTH ONE TIME DAILY     Insulin Pen Needle 31G X 8 MM Misc  Use to inject insulin 4 times daily.     B-D ULTRAFINE III SHORT PEN 31G X 8 MM Misc   Generic drug:  Insulin Pen Needle  FOLLOW DIRECTIONS PROVIDED BY PHYSICIAN     BRISDELLE 7.5 MG Caps  Generic drug:  PARoxetine Mesylate  Take 7.5 mg by mouth daily.     buPROPion 300 MG 24 hr tablet  Commonly known as:  WELLBUTRIN XL  Take 300 mg by mouth daily.     CARTIA XT 120 MG 24 hr capsule  Generic drug:  diltiazem  TAKE ONE CAPSULE BY MOUTH ONE TIME DAILY IN THE EVENING     carvedilol 25 MG tablet  Commonly known as:  COREG  TAKE ONE TABLET BY MOUTH TWICE DAILY with a meal.     chlorpheniramine-HYDROcodone 10-8 MG/5ML Suer  Commonly known as:  TUSSIONEX  Take 5 mLs by mouth 2 (two) times daily as needed for cough.     cholecalciferol 1000 UNITS tablet  Commonly known as:  VITAMIN D  Take 1,000 Units by mouth daily.     Cholecalciferol 5000 UNITS Tabs  Take 1 tablet (5,000 Units total) by mouth daily.     DEXILANT 60 MG capsule  Generic drug:  dexlansoprazole  TAKE ONE CAPSULE BY MOUTH ONE TIME DAILY     diphenhydrAMINE 25 mg capsule  Commonly known as:  BENADRYL  Take 1 capsule (25 mg total) by mouth every 6 (six) hours as needed for itching.     doxycycline 100 MG EC tablet  Commonly known as:  DORYX  Take 100 mg by mouth 2 (two) times daily.     estradiol 2 MG tablet  Commonly known as:  ESTRACE  Take 2 mg by mouth at bedtime.     fenofibrate 145 MG tablet  Commonly known as:  TRICOR  TAKE ONE TABLET BY MOUTH ONE TIME DAILY     fluticasone 50 MCG/ACT nasal spray  Commonly known as:  FLONASE  USE TWO SPRAYS IN EACH NOSTRIL DAILY     furosemide 40 MG tablet  Commonly known as:  LASIX  Take 1 tablet (40 mg total) by mouth 2 (two) times daily.     gabapentin 100 MG capsule  Commonly known as:  NEURONTIN  TAKE ONE CAPSULE BY MOUTH THREE TIMES DAILY     glucose blood test strip  Commonly known as:  ONE TOUCH ULTRA TEST  1 each by Other route 3 (three) times daily. Use as instructed     HYDROcodone-acetaminophen 10-325 MG per tablet  Commonly  known as:  NORCO  Take 1-2 tablets by mouth every 4 (four) hours as needed for  moderate pain.     HYDROcodone-acetaminophen 5-325 MG per tablet  Commonly known as:  NORCO  Take 1 tablet by mouth every 6 (six) hours as needed.     Insulin Glargine 300 UNIT/ML Sopn  Commonly known as:  TOUJEO SOLOSTAR  Inject 40 Units into the skin at bedtime.     metFORMIN 1000 MG tablet  Commonly known as:  GLUCOPHAGE  TAKE ONE TABLET BY MOUTH TWICE DAILY with a meal.     mometasone-formoterol 200-5 MCG/ACT Aero  Commonly known as:  DULERA  Inhale 2 puffs into the lungs 2 (two) times daily.     nitroGLYCERIN 0.4 MG SL tablet  Commonly known as:  NITROSTAT  Place 0.4 mg under the tongue every 5 (five) minutes as needed. x3 doses as needed for chest pain.     NOVOLOG FLEXPEN 100 UNIT/ML FlexPen  Generic drug:  insulin aspart  Inject 10-18 Units into the skin 3 (three) times daily with meals.     ondansetron 4 MG disintegrating tablet  Commonly known as:  ZOFRAN-ODT  Take 4 mg by mouth every 8 (eight) hours as needed for nausea or vomiting.     potassium chloride SA 20 MEQ tablet  Commonly known as:  K-DUR,KLOR-CON  Take 1 tablet (20 mEq total) by mouth 3 (three) times daily.     predniSONE 5 MG tablet  Commonly known as:  DELTASONE  Take 1 tablet (5 mg total) by mouth daily with breakfast. Sig 5mg s daily     saccharomyces boulardii 250 MG capsule  Commonly known as:  FLORASTOR  Take 250 mg by mouth daily.     venlafaxine XR 75 MG 24 hr capsule  Commonly known as:  EFFEXOR-XR  Take 1 capsule (75 mg total) by mouth 2 (two) times daily.     vitamin B-12 1000 MCG tablet  Commonly known as:  CYANOCOBALAMIN  Take 1,000 mcg by mouth daily.        Diagnostic Studies: Mr Foot Left W Wo Contrast  06/03/2015   CLINICAL DATA:  Ankle pins removed from the heel 03/19/2015. Left great toe pain. Lateral ankle pain.  EXAM: MRI OF THE LEFT FOREFOOT WITHOUT AND WITH CONTRAST  TECHNIQUE: Multiplanar,  multisequence MR imaging was performed both before and after administration of intravenous contrast.  CONTRAST:  57mL MULTIHANCE GADOBENATE DIMEGLUMINE 529 MG/ML IV SOLN  COMPARISON:  None.  FINDINGS: There is evidence of prior calcaneal osteotomy of the left posterior calcaneus with hardware removal. There is a soft tissue wound along the posterior lateral aspect of the left hindfoot extending to the lateral osteotomy site of the calcaneus. There is soft tissue enhancement and osseous enhancement with mild cortical irregularity along the lateral aspect of the calcaneal osteotomy site. There is no drainable fluid collection.  There is orthopedic hardware transfixing the base of the fifth metatarsal with surrounding susceptibility artifact. There is orthopedic hardware in the first metatarsal partially visualize.  There is moderate osteoarthritis of the second tarsometatarsal joint. There is no acute fracture or dislocation.  The flexor, extensor and peroneal tendons are intact. There is thickening of the anterior talofibular ligament consistent with prior injury. The posterior talofibular ligament, anterior tibiofibular ligament and posterior tibiofibular ligaments are intact. The deltoid ligament is intact. The spring ligament is intact. The Achilles tendon is intact.  IMPRESSION: 1. There is evidence of prior calcaneal osteotomy of the left posterior calcaneus with hardware removal. There is a soft tissue wound along the posterior lateral aspect of the  left hindfoot extending to the lateral osteotomy site of the calcaneus. There is soft tissue enhancement and osseous enhancement with mild cortical irregularity along the lateral aspect of the calcaneal osteotomy site which may reflect postsurgical granulation tissue, but osteomyelitis cannot be completely excluded. No drainable fluid collection.   Electronically Signed   By: Kathreen Devoid   On: 06/03/2015 14:18    Disposition: 01-Home or Self Care        Discharge Instructions    Call MD / Call 911    Complete by:  As directed   If you experience chest pain or shortness of breath, CALL 911 and be transported to the hospital emergency room.  If you develope a fever above 101 F, pus (white drainage) or increased drainage or redness at the wound, or calf pain, call your surgeon's office.     Call MD / Call 911    Complete by:  As directed   If you experience chest pain or shortness of breath, CALL 911 and be transported to the hospital emergency room.  If you develope a fever above 101.5 F, pus (white drainage) or increased drainage or redness at the wound, or calf pain, call your surgeon's office.     Call MD / Call 911    Complete by:  As directed   If you experience chest pain or shortness of breath, CALL 911 and be transported to the hospital emergency room.  If you develope a fever above 101 F, pus (white drainage) or increased drainage or redness at the wound, or calf pain, call your surgeon's office.     Constipation Prevention    Complete by:  As directed   Drink plenty of fluids.  Prune juice may be helpful.  You may use a stool softener, such as Colace (over the counter) 100 mg twice a day.  Use MiraLax (over the counter) for constipation as needed.     Constipation Prevention    Complete by:  As directed   Drink plenty of fluids.  Prune juice may be helpful.  You may use a stool softener, such as Colace (over the counter) 100 mg twice a day.  Use MiraLax (over the counter) for constipation as needed.     Constipation Prevention    Complete by:  As directed   Drink plenty of fluids.  Prune juice may be helpful.  You may use a stool softener, such as Colace (over the counter) 100 mg twice a day.  Use MiraLax (over the counter) for constipation as needed.     Diet - low sodium heart healthy    Complete by:  As directed      Diet - low sodium heart healthy    Complete by:  As directed      Diet - low sodium heart healthy    Complete by:   As directed      Diet general    Complete by:  As directed      Driving restrictions    Complete by:  As directed   No driving while taking narcotic pain meds.     Increase activity slowly as tolerated    Complete by:  As directed      Increase activity slowly as tolerated    Complete by:  As directed      Increase activity slowly as tolerated    Complete by:  As directed      Non weight bearing    Complete by:  As  directed   Laterality:  left  Extremity:  Lower     Post-op shoe    Complete by:  As directed            Follow-up Information    Follow up with DUDA,MARCUS V, MD In 2 weeks.   Specialty:  Orthopedic Surgery   Contact information:   [name] [address] [phone]       Follow up with [facility].   Why:  Home health Registered Nurse; Above agency should call you within 24-48 hours of discharge; if you do not hear from them, please call number listed above.    Contact information:   [address] [place] [address] [phone]        Signed: Marianna Payment 07/01/2015, 11:29 AM

## 2015-07-02 LAB — ANAEROBIC CULTURE

## 2015-07-03 IMAGING — CT CT ABD-PELV W/ CM
2 of 5 series · 17 of 46 positions shown, 19 images · IV contrast (OMNIPAQUE)
Comparison: CT abdomen pelvis 02/18/2011

CLINICAL DATA: Abdominal pain and nausea. Prior cholecystectomy and
hysterectomy.

EXAM:
CT ABDOMEN AND PELVIS WITH CONTRAST
TECHNIQUE: Multidetector CT imaging of the abdomen and pelvis was performed
using the standard protocol following bolus administration of
intravenous contrast.
CONTRAST:  100mL OMNIPAQUE IOHEXOL 300 MG/ML  SOLN

[Series 2: rtn a/p with · axial · 0.85mm/px · z∈[-508,-64]mm · 14 of 101 slices shown, 16 images]
[im 6/101  soft-tissue]
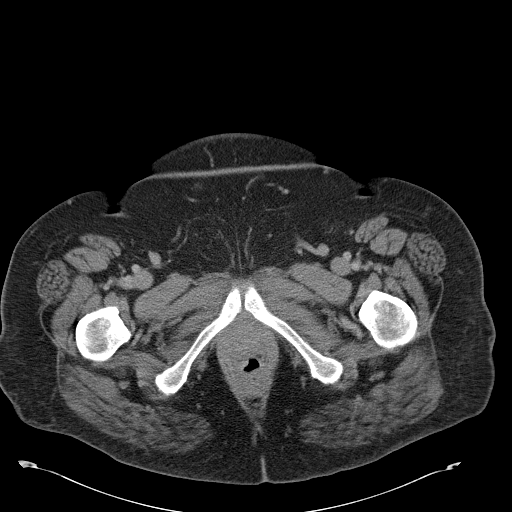
[im 6/101  bone]
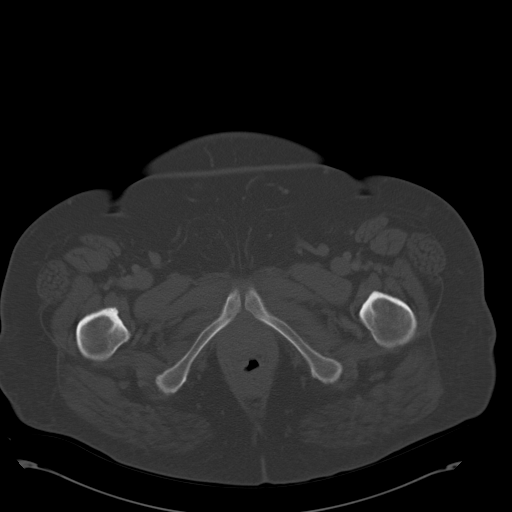
[im 11/101  soft-tissue]
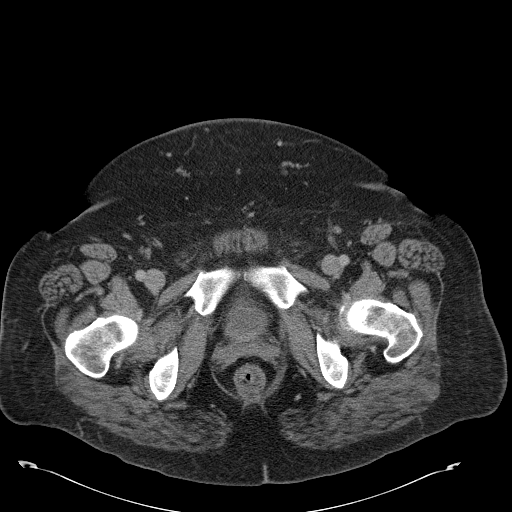
[im 22/101  soft-tissue]
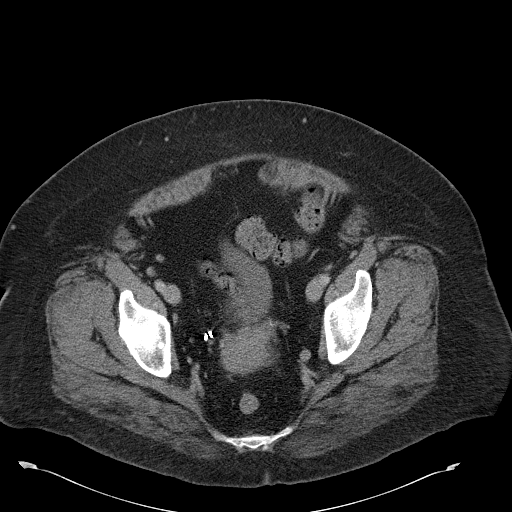
[im 27/101  soft-tissue]
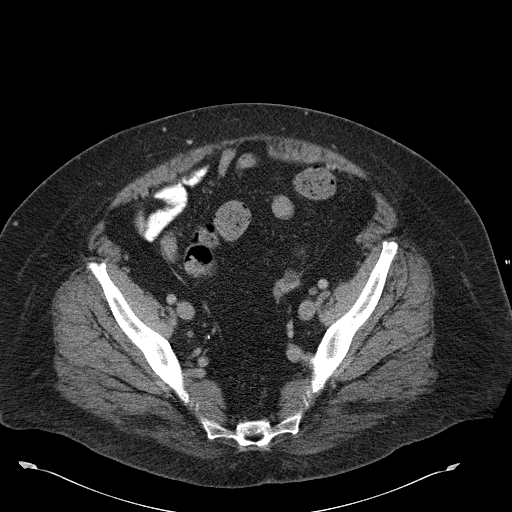
[im 32/101  soft-tissue]
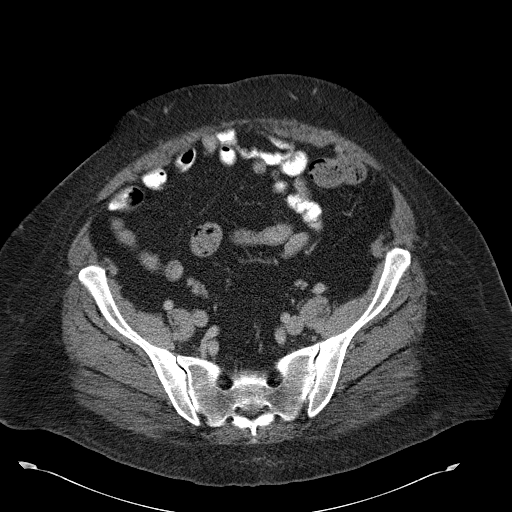
[im 43/101  soft-tissue]
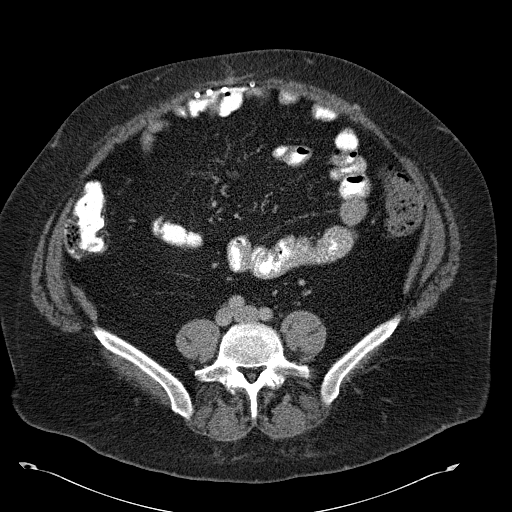
[im 48/101  soft-tissue]
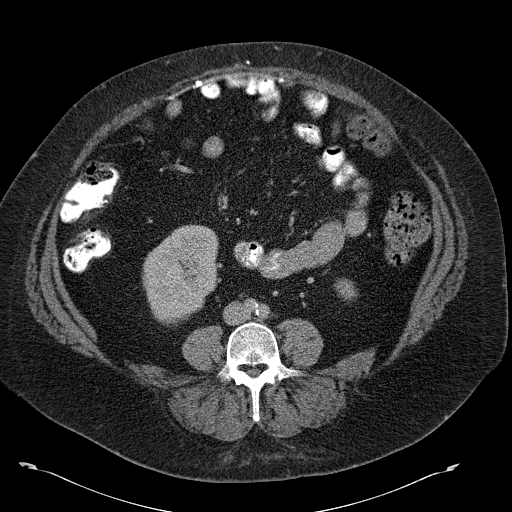
[im 53/101  soft-tissue]
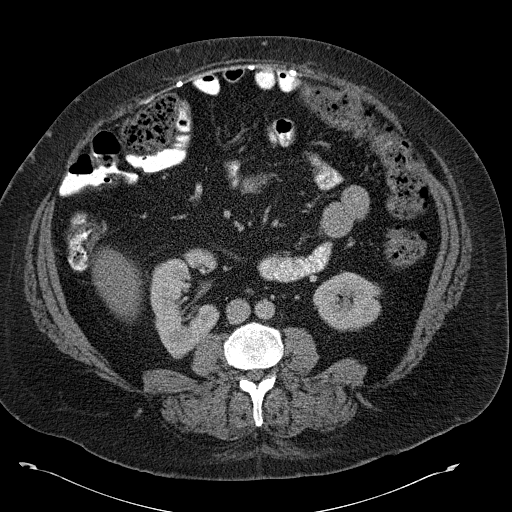
[im 58/101  soft-tissue]
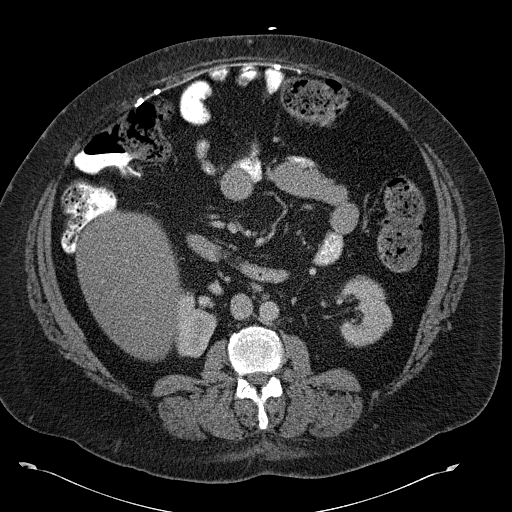
[im 58/101  bone]
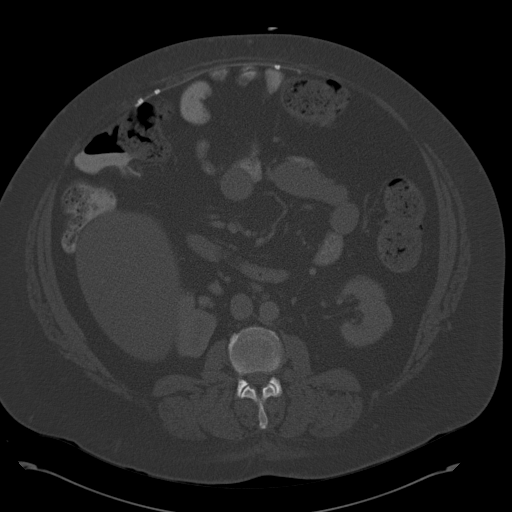
[im 69/101  soft-tissue]
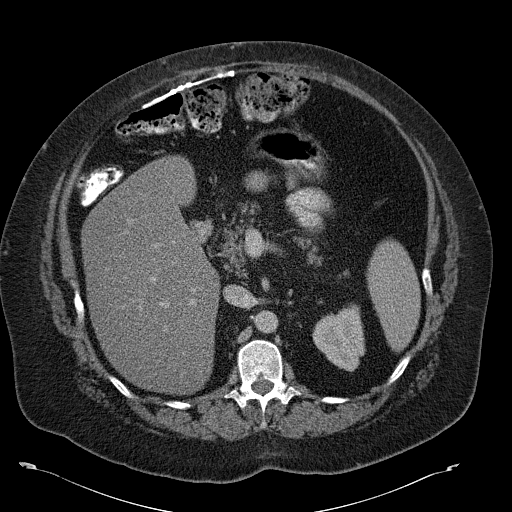
[im 74/101  soft-tissue]
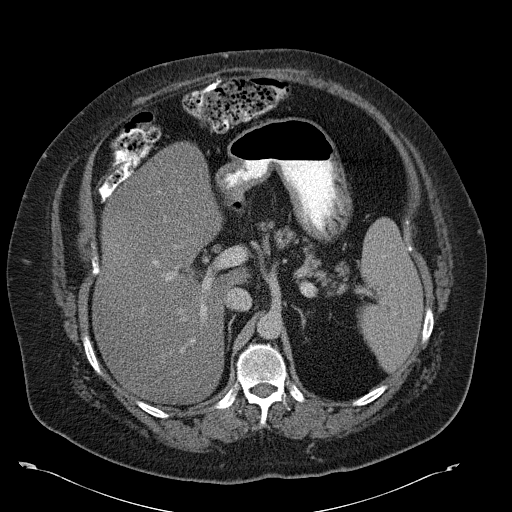
[im 79/101  soft-tissue]
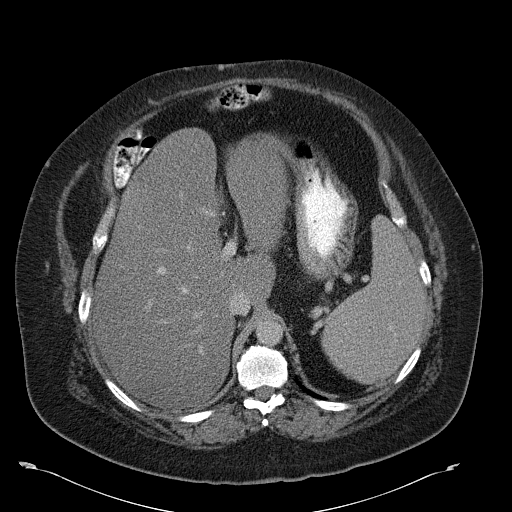
[im 90/101  soft-tissue]
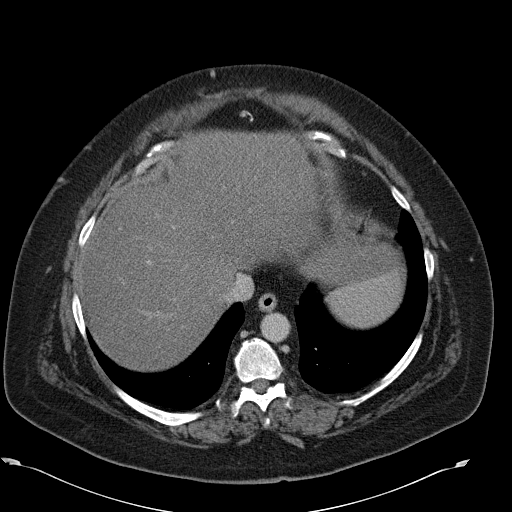
[im 95/101  soft-tissue]
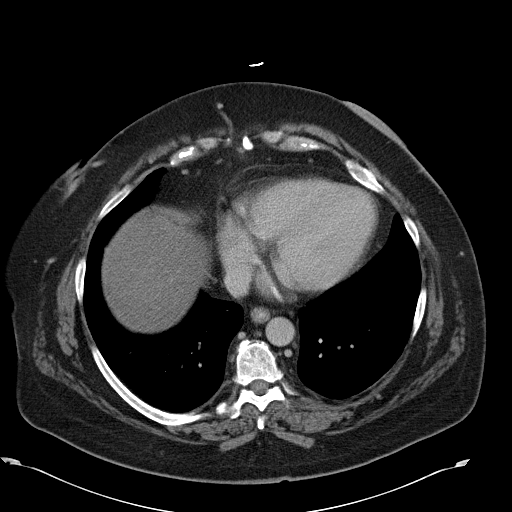

[Series 602: <mpr thick range> · coronal · 0.98mm/px · 3 of 126 slices shown]
[im 42/126  soft-tissue]
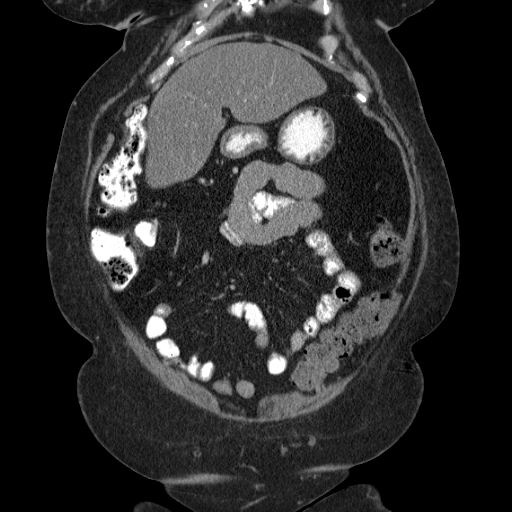
[im 56/126  soft-tissue]
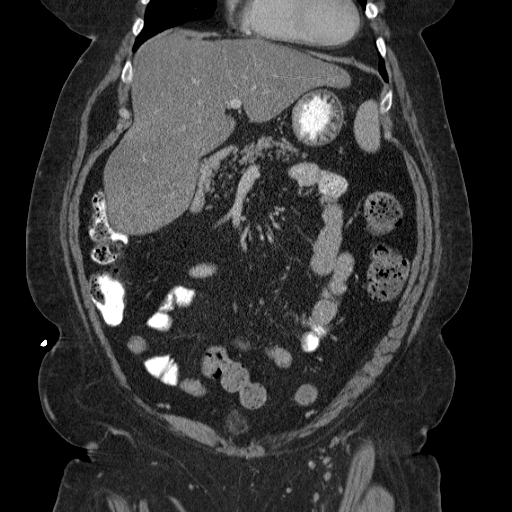
[im 70/126  soft-tissue]
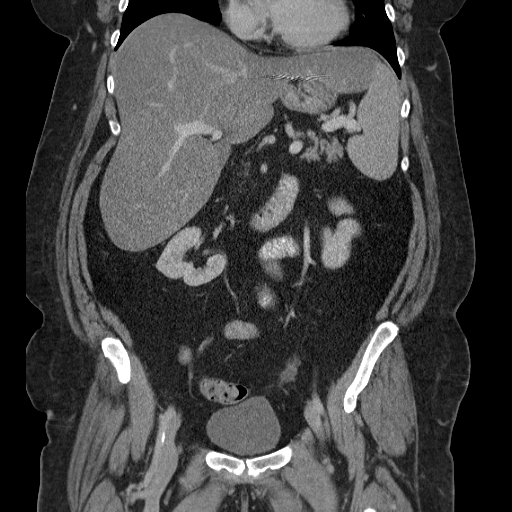

[17 of 46 positions shown; findings below may reference images not displayed]

FINDINGS: The imaged lung bases are clear. Heart size appears mildly enlarged.
Small, stable juxta-diaphragmatic lymph nodes consistent with benign
etiology.

Patient has marked and diffuse fatty infiltration of the liver. The
liver is enlarged, measuring 24 cm in sagittal length. No focal
hepatic mass lesion or intrahepatic biliary ductal dilatation.
Portal vein is patent. Status post cholecystectomy. No extrahepatic
bile duct dilatation.

The spleen, adrenal glands, and kidneys are within normal limits.
There is fatty atrophy of the pancreas. No pancreatic mass or acute
inflammatory change.

There are surgical clips at the gastric fundus. Stomach is
unremarkable. Small bowel loops are normal in caliber. No small
bowel wall thickening. Normal appearance of the terminal ileum.
Normal caliber colon with moderate stool burden. No colonic
inflammatory changes. Urinary bladder normal. Hysterectomy. No
adnexal mass. Negative for lymphadenopathy, ascites, or free air.

Postoperative changes of anterior abdominal wall ventral hernia
repair with mesh. Tiny umbilical hernia containing fat only noted.
No significant hernia.

Normal caliber abdominal aorta with mild areas of focal
atherosclerotic calcification. No acute or suspicious osseous
abnormality.
IMPRESSION: 1. Hepatomegaly with marked diffuse hepatic steatosis.
2. Moderate colonic stool burden.  Negative for bowel obstruction.
3. Prior cholecystectomy.  Negative for biliary ductal dilatation.

## 2015-07-17 LAB — HM DIABETES EYE EXAM

## 2015-07-18 ENCOUNTER — Encounter: Payer: Self-pay | Admitting: Family Medicine

## 2015-07-19 ENCOUNTER — Other Ambulatory Visit: Payer: Self-pay | Admitting: Internal Medicine

## 2015-07-26 IMAGING — CR DG CHEST 1V PORT
1 series · 1 of 1 positions shown · non-contrast
Comparison: 04/19/2014

CLINICAL DATA: Nonproductive cough since yesterday, wheezing

EXAM:
PORTABLE CHEST - 1 VIEW

[AP]
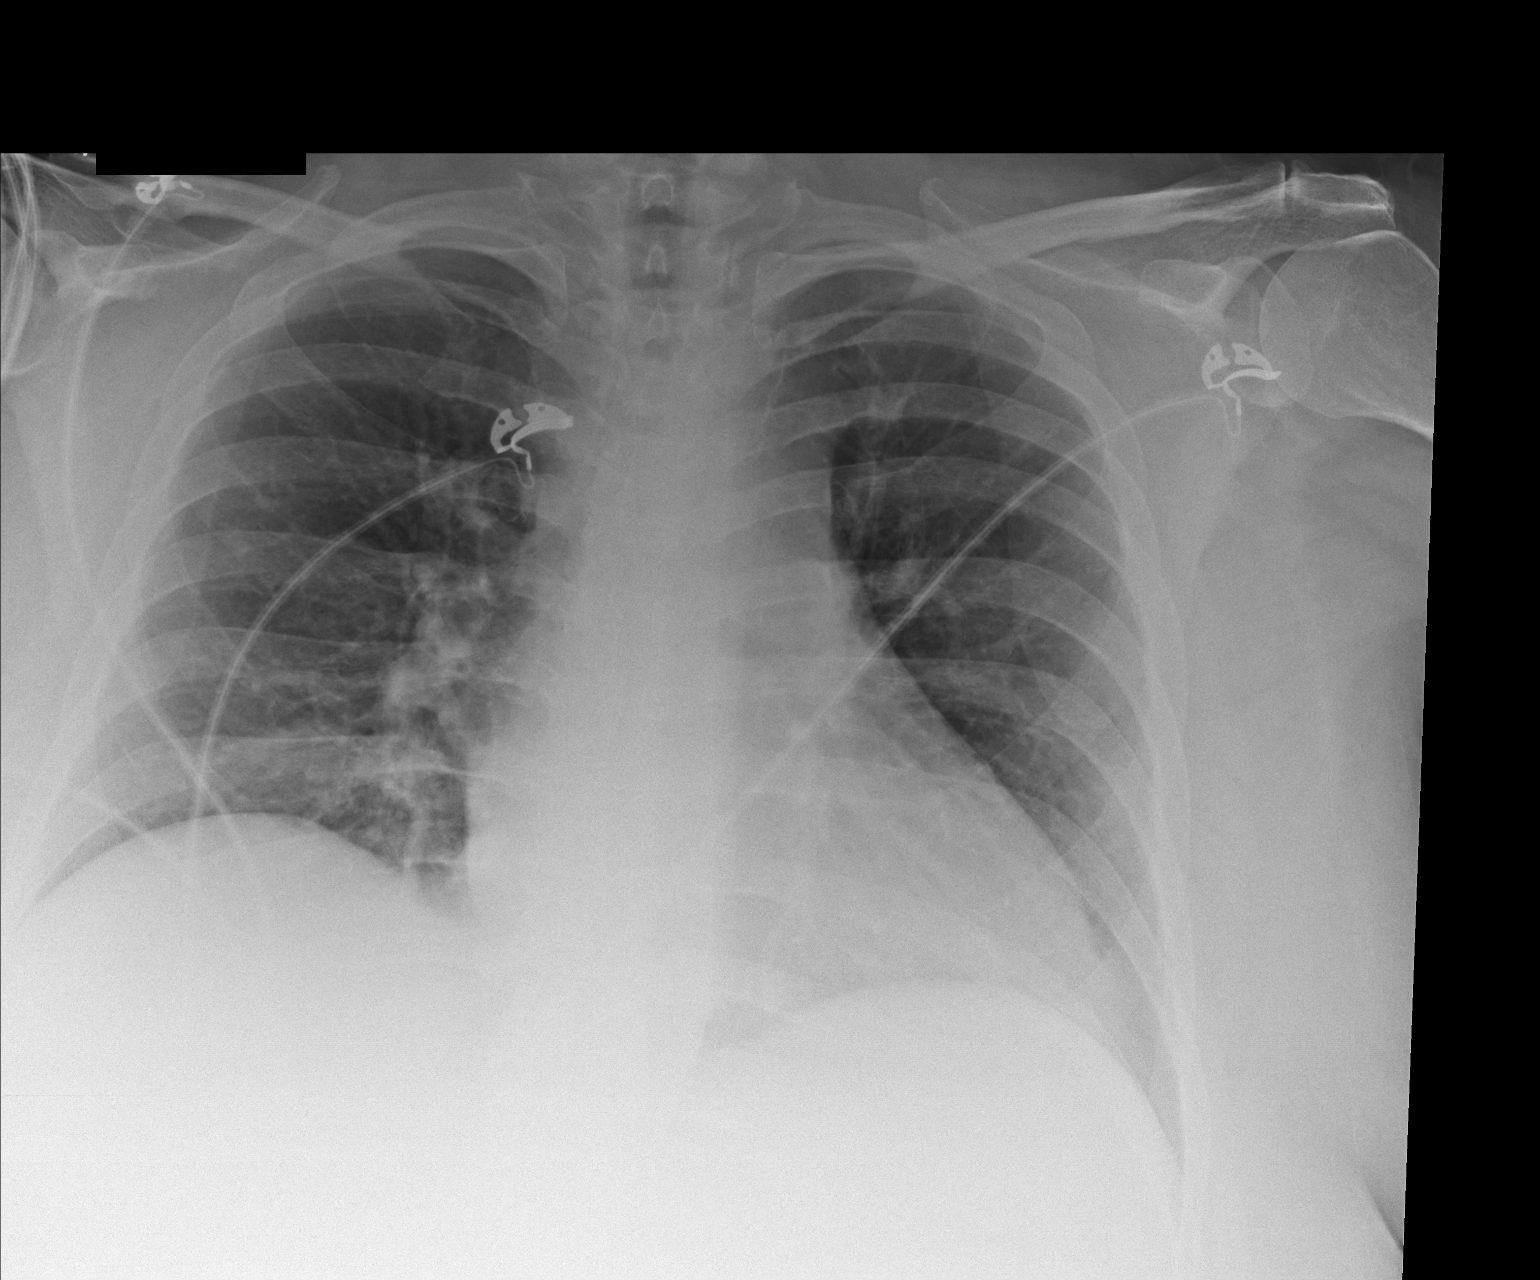

[1 of 1 positions shown; findings below may reference images not displayed]

FINDINGS: The heart size and mediastinal contours are within normal limits.
Both lungs are clear. The visualized skeletal structures are
unremarkable.
IMPRESSION: No active disease.

## 2015-07-30 ENCOUNTER — Ambulatory Visit (INDEPENDENT_AMBULATORY_CARE_PROVIDER_SITE_OTHER): Payer: BLUE CROSS/BLUE SHIELD | Admitting: Infectious Disease

## 2015-07-30 ENCOUNTER — Other Ambulatory Visit: Payer: Self-pay | Admitting: Infectious Disease

## 2015-07-30 ENCOUNTER — Encounter: Payer: Self-pay | Admitting: Infectious Disease

## 2015-07-30 VITALS — BP 117/73 | HR 85 | Temp 98.1°F | Ht 66.0 in | Wt 255.0 lb

## 2015-07-30 DIAGNOSIS — E1141 Type 2 diabetes mellitus with diabetic mononeuropathy: Secondary | ICD-10-CM

## 2015-07-30 DIAGNOSIS — T847XXD Infection and inflammatory reaction due to other internal orthopedic prosthetic devices, implants and grafts, subsequent encounter: Secondary | ICD-10-CM | POA: Diagnosis not present

## 2015-07-30 DIAGNOSIS — M86172 Other acute osteomyelitis, left ankle and foot: Secondary | ICD-10-CM | POA: Diagnosis not present

## 2015-07-30 DIAGNOSIS — E1169 Type 2 diabetes mellitus with other specified complication: Secondary | ICD-10-CM | POA: Diagnosis not present

## 2015-07-30 DIAGNOSIS — M869 Osteomyelitis, unspecified: Secondary | ICD-10-CM

## 2015-07-30 DIAGNOSIS — M86672 Other chronic osteomyelitis, left ankle and foot: Secondary | ICD-10-CM

## 2015-07-30 DIAGNOSIS — E1165 Type 2 diabetes mellitus with hyperglycemia: Secondary | ICD-10-CM

## 2015-07-30 DIAGNOSIS — T814XXA Infection following a procedure, initial encounter: Principal | ICD-10-CM

## 2015-07-30 DIAGNOSIS — IMO0001 Reserved for inherently not codable concepts without codable children: Secondary | ICD-10-CM

## 2015-07-30 MED ORDER — DOXYCYCLINE HYCLATE 100 MG PO TBEC: 100.0000 mg | DELAYED_RELEASE_TABLET | Freq: Two times a day (BID) | ORAL | Status: DC

## 2015-07-30 NOTE — Progress Notes (Signed)
Chief complaint: followup for calcaneal osteomyelitis  Subjective:    Patient ID: [name], female    DOB: 1961/03/21, 54 y.o.   MRN: [id number]  HPI   54 y.o. female with multiple medical problems who initially had a fracture of her 5th metatarsal and developed non union. Underwent ORIF with bone grafting but then fell and broke the 5th metatarsal again. She underwent hardware removal and ORIF again 3 months ago with midfoot reconstruction. The calcaneal osteotomy did not heal well and in May noted draiange, which she describes as white, pus like drainage. Developed swelling of foot and leg. She was started on Septra DS, 2 pills bid with rifampin though did not resolve. Underwent I and D in [place] with associated hardware removal but presented here to Dr. Percell Miller with persistent drainage. She underwent I and D and superficial swab with MRSA and CoNS, no bone culture done. She was started on and has continued with vancomycin and rifampin. Unfortunately, she developed AKI and was reshospitalized. Her creat was increased to 1.87 and vancomycin stopped and started linezolid. Her creat remained stable, though still up from her baseline at 1.68 after her visit to Dr. Yong Channel. She was sent out on doxycyline after her insurace refused linezolid coverage. She developed a rash with this but continued the doxycyline, until seen by Dr. Linus Salmons roughly 16 days ago. She was taken off doxycycline but since has seen Dr. Para March who is concerned that the patient had worsening left heel pain which is hard for her now to bear. Patient had labs done which showed an elevated sedimentation rate above 50. 4 to layer creatinine had now improved to 1.35. See labs of lobe picture.  She's been restarted on doxycycline and has had re-emergence of her rash is intensely pruritic  Labs from Dr Noemi Chapel:      When I saw her I was concerned for worsening osteomyelitis and obtained MRI of foot on 06/03/15 which  showed:  IMPRESSION: 1. There is evidence of prior calcaneal osteotomy of the left posterior calcaneus with hardware removal. There is a soft tissue wound along the posterior lateral aspect of the left hindfoot extending to the lateral osteotomy site of the calcaneus. There is soft tissue enhancement and osseous enhancement with mild cortical irregularity along the lateral aspect of the calcaneal osteotomy site which may reflect postsurgical granulation tissue, but osteomyelitis cannot be completely excluded. No drainable fluid collection.  She was seen b y Dr. Sharol Given with orthopedic surgery. I had stopped her doxycyline in hopes that we might obtain helpful cultures in the OR to guide therapy if she underwent surgery. Since I last saw her she was seen by Dr Sharol Given who restarted her oral doxycycline and then admitted her to [facility] where he performed Partial Excision Left Calcaneus, Place Antibiotic Beads, and Wound VAC on 06/27/15. The patient states that she has not been on ANY antibiotics postoperatively whatsoever. Her cultures on doxycycline did not yield any organism. She has NOT had worsening of pain or drainage since her surgery a month ago. I DID propose however that she undergo course of rx with IV antibiotics to maximize her chances of control, cure of this calcaneal osteomyelitis.   Past Medical History  Diagnosis Date  . DIABETES MELLITUS, TYPE II 08/21/2006  . HYPERLIPIDEMIA 08/21/2006  . GOUT 08/20/2010  . OBESITY 06/04/2009  . ANEMIA-UNSPECIFIED 09/18/2009  . HYPERTENSION 08/21/2006  . GERD 08/21/2006  . Internal hemorrhoids   . Pulmonary sarcoidosis ([place])  Followed locally by pulmonology, but also by Dr. Casper Harrison at [facility]  . Exertional chest pain     sharp, substernal, exertional  . Vocal cord dysfunction   . Chronic diastolic heart failure, NYHA class 2 (HCC)     LVEDP roughly 20% by cath  . CHF (congestive heart failure) ([place])   . COPD (chronic  obstructive pulmonary disease) ([place])   . Depression   . Fracture of 5th metatarsal     non union  . Abnormal SPEP 04/17/2014  . Osteomyelitis of left foot ([place]) 05/29/2015  . Diabetic osteomyelitis ([place]) 05/29/2015  . Hx of umbilical hernia repair   . Infection of wound due to methicillin resistant Staphylococcus aureus (MRSA)   . Wears partial dentures     Past Surgical History  Procedure Laterality Date  . Ventral hernia repair    . Nissen fundoplication  7622  . Cholecystectomy  1984  . Abdominal hysterectomy    . Knee arthroscopy      right  . Tubal ligation      with reversal in 1994  . Bladder suspension  11/11/2011    Procedure: TRANSVAGINAL TAPE (TVT) PROCEDURE;  Surgeon: Olga Millers, MD;  Location: [facility];  Service: Gynecology;  Laterality: N/A;  . Cystoscopy  11/11/2011    Procedure: CYSTOSCOPY;  Surgeon: Olga Millers, MD;  Location: [facility];  Service: Gynecology;  Laterality: N/A;  . Doppler echocardiography  02/12/2013    LV FUNCTION, SIZE NORMAL; MILD CONCENTRIC LVH; EST EF 55-65%; WALL MOTION NORMAL  . Lexiscan myoview  03/09/2013    EF 50%; NORMAL MYOCARDIAL PERFUSION STUDY - breast attenuation  . Cardiac catheterization  07/2010    LVEF 50-55% WITH VERY MILD GLOBAL HYPOKINESIA; ESSENTIALLY NORMAL CORONARY ARTERIES; NORMAL LV FUNCTION  . Carotids  02/18/11    CAROTID DUPLEX; VERTEBRALS ARE PATENT WITH ANTEGRADE FLOW. ICA/CCA RATIO 1.61 ON RIGHT AND 0.75 ON LEFT  . Right and left cardiac catheterization  04/23/2013    Angiographic normal coronaries; LVEDP 20 mmHg, PCWP 12-14 mmHg, RAP 12 mmHg.; Fick CO/CI 4.9/2.2  . Metatarsal osteotomy with open reduction internal fixation (orif) metatarsal with fusion Left 04/09/2014    Procedure: LEFT FOOT FRACTURE OPEN TREATMENT METATARSAL INCLUDES INTERNAL FIXATION EACH;  Surgeon: Lorn Junes, MD;  Location: [facility];  Service: Orthopedics;  Laterality: Left;  . Left and right heart catheterization with  coronary angiogram N/A 04/23/2013    Procedure: LEFT AND RIGHT HEART CATHETERIZATION WITH CORONARY ANGIOGRAM;  Surgeon: Leonie Man, MD;  Location: [facility];  Service: Cardiovascular;  Laterality: N/A;  . Hernia repair    . Appendectomy    . Fracture surgery    . I&d extremity Left 06/27/2015    Procedure: Partial Excision Left Calcaneus, Place Antibiotic Beads, and Wound VAC;  Surgeon: Newt Minion, MD;  Location: [facility];  Service: Orthopedics;  Laterality: Left;    Family History  Problem Relation Age of Onset  . Diabetes Father   . Heart attack Father   . Coronary artery disease Father   . Heart failure Father   . COPD Mother   . Emphysema Mother   . Asthma Mother   . Heart failure Mother   . Sarcoidosis Maternal Uncle   . Colon cancer Neg Hx   . Lung cancer Brother   . Cancer Brother   . Diabetes Brother   . Heart attack Maternal Grandfather       Social History  Social History  . Marital Status: Married    Spouse Name: TANESSA TIDD  . Number of Children: 2  . Years of Education: 12   Occupational History  . DISABLED    Social History Main Topics  . Smoking status: Never Smoker   . Smokeless tobacco: Never Used  . Alcohol Use: No  . Drug Use: No  . Sexual Activity: Yes    Birth Control/ Protection: Surgical   Other Topics Concern  . None   Social History Narrative   Married 1994. 2 sons who both live close and 1 grandson.       Disability due to sarcoidosis. 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 Chlorhexidine Itching  . Lisinopril Cough  . Methotrexate Other (See Comments)     peri-oral and buccal lesions.  . Clindamycin/Lincomycin Nausea And Vomiting and Rash     Current outpatient prescriptions:  .  albuterol (PROVENTIL HFA;VENTOLIN HFA) 108 (90 BASE)  MCG/ACT inhaler, Inhale 1-2 puffs into the lungs every 6 (six) hours as needed for wheezing or shortness of breath. For wheezing., Disp: , Rfl:  .  allopurinol (ZYLOPRIM) 100 MG tablet, Take 100 mg by mouth at bedtime. , Disp: , Rfl:  .  aspirin EC 81 MG tablet, Take 81 mg by mouth daily. , Disp: , Rfl:  .  atorvastatin (LIPITOR) 40 MG tablet, TAKE ONE TABLET BY MOUTH ONE TIME DAILY, Disp: 90 tablet, Rfl: 3 .  B-D ULTRAFINE III SHORT PEN 31G X 8 MM MISC, FOLLOW DIRECTIONS PROVIDED BY PHYSICIAN, Disp: 130 each, Rfl: 2 .  buPROPion (WELLBUTRIN XL) 300 MG 24 hr tablet, TAKE ONE TABLET BY MOUTH ONE TIME DAILY, Disp: 30 tablet, Rfl: 8 .  CARTIA XT 120 MG 24 hr capsule, TAKE ONE CAPSULE BY MOUTH ONE TIME DAILY IN THE EVENING , Disp: 30 capsule, Rfl: 11 .  carvedilol (COREG) 25 MG tablet, TAKE ONE TABLET BY MOUTH TWICE DAILY with a meal., Disp: 180 tablet, Rfl: 1 .  chlorpheniramine-HYDROcodone (TUSSIONEX) 10-8 MG/5ML SUER, Take 5 mLs by mouth 2 (two) times daily as needed for cough. , Disp: , Rfl: 0 .  cholecalciferol (VITAMIN D) 1000 UNITS tablet, Take 1,000 Units by mouth daily., Disp: , Rfl:  .  cholecalciferol 5000 UNITS TABS, Take 1 tablet (5,000 Units total) by mouth daily. (Patient taking differently: Take 5,000 Units by mouth every morning. ), Disp: 30 tablet, Rfl: 3 .  DAPTOmycin in sodium chloride 0.9 % 100 mL, Inject 8 mg/kg into the vein daily., Disp: , Rfl:  .  DEXILANT 60 MG capsule, TAKE ONE CAPSULE BY MOUTH ONE TIME DAILY, Disp: 30 capsule, Rfl: 6 .  diphenhydrAMINE (BENADRYL) 25 mg capsule, Take 1 capsule (25 mg total) by mouth every 6 (six) hours as needed for itching., Disp: 15 capsule, Rfl: 0 .  doxycycline (DORYX) 100 MG EC tablet, Take 1 tablet (100 mg total) by mouth 2 (two) times daily., Disp: 60 tablet, Rfl: 5 .  estradiol (ESTRACE) 2 MG tablet, Take 2 mg by mouth at bedtime. , Disp: , Rfl:  .  fenofibrate (TRICOR) 145 MG tablet, TAKE ONE TABLET BY MOUTH ONE TIME DAILY  (Patient  taking differently: TAKE ONE TABLET BY MOUTH every evening), Disp: 30 tablet, Rfl: 10 .  fluticasone (FLONASE) 50 MCG/ACT nasal  spray, USE TWO SPRAYS IN EACH NOSTRIL DAILY, Disp: 16 g, Rfl: 4 .  furosemide (LASIX) 40 MG tablet, Take 1 tablet (40 mg total) by mouth 2 (two) times daily., Disp: 60 tablet, Rfl: 4 .  gabapentin (NEURONTIN) 100 MG capsule, Take 1 capsule (100 mg total) by mouth 3 (three) times daily. **PT NEEDS AN APPT FOR FURTHER REFILLS**, Disp: 90 capsule, Rfl: 1 .  glucose blood (ONE TOUCH ULTRA TEST) test strip, 1 each by Other route 3 (three) times daily. Use as instructed, Disp: 100 each, Rfl: 5 .  HYDROcodone-acetaminophen (NORCO) 10-325 MG per tablet, Take 1-2 tablets by mouth every 4 (four) hours as needed for moderate pain. , Disp: , Rfl: 0 .  Insulin Glargine (TOUJEO SOLOSTAR) 300 UNIT/ML SOPN, Inject 40 Units into the skin at bedtime., Disp: 3 pen, Rfl: 2 .  Insulin Pen Needle 31G X 8 MM MISC, Use to inject insulin 4 times daily., Disp: 130 each, Rfl: 2 .  metFORMIN (GLUCOPHAGE) 1000 MG tablet, TAKE ONE TABLET BY MOUTH TWICE DAILY with a meal., Disp: 180 tablet, Rfl: 2 .  mometasone-formoterol (DULERA) 200-5 MCG/ACT AERO, Inhale 2 puffs into the lungs 2 (two) times daily., Disp: 1 Inhaler, Rfl: 6 .  nitroGLYCERIN (NITROSTAT) 0.4 MG SL tablet, Place 0.4 mg under the tongue every 5 (five) minutes as needed. x3 doses as needed for chest pain., Disp: , Rfl:  .  NOVOLOG FLEXPEN 100 UNIT/ML FlexPen, Inject 10-18 Units into the skin 3 (three) times daily with meals. , Disp: , Rfl: 10 .  ondansetron (ZOFRAN-ODT) 4 MG disintegrating tablet, Take 4 mg by mouth every 8 (eight) hours as needed for nausea or vomiting. , Disp: , Rfl: 0 .  PARoxetine Mesylate (BRISDELLE) 7.5 MG CAPS, Take 7.5 mg by mouth daily., Disp: , Rfl:  .  predniSONE (DELTASONE) 5 MG tablet, Take 1 tablet (5 mg total) by mouth daily with breakfast. Sig 5mg s daily (Patient taking differently: Take 5 mg by mouth daily with  breakfast. ), Disp: , Rfl:  .  saccharomyces boulardii (FLORASTOR) 250 MG capsule, Take 250 mg by mouth daily., Disp: , Rfl:  .  venlafaxine XR (EFFEXOR-XR) 75 MG 24 hr capsule, Take 1 capsule (75 mg total) by mouth 2 (two) times daily., Disp: 20 capsule, Rfl: 0 .  vitamin B-12 (CYANOCOBALAMIN) 1000 MCG tablet, Take 1,000 mcg by mouth daily., Disp: , Rfl:  .  potassium chloride SA (K-DUR,KLOR-CON) 20 MEQ tablet, Take 1 tablet (20 mEq total) by mouth 3 (three) times daily., Disp: 90 tablet, Rfl: 1      Review of Systems  Constitutional: Negative for fever, chills, diaphoresis, activity change, appetite change, fatigue and unexpected weight change.  HENT: Negative for congestion, rhinorrhea, sinus pressure, sneezing, sore throat and trouble swallowing.   Eyes: Negative for photophobia and visual disturbance.  Respiratory: Negative for cough, chest tightness, shortness of breath, wheezing and stridor.   Cardiovascular: Negative for chest pain, palpitations and leg swelling.  Gastrointestinal: Negative for nausea, vomiting, abdominal pain, diarrhea, constipation, blood in stool, abdominal distention and anal bleeding.  Genitourinary: Negative for dysuria, hematuria, flank pain and difficulty urinating.  Musculoskeletal: Positive for arthralgias. Negative for myalgias, back pain, joint swelling and gait problem.  Skin: Positive for rash. Negative for color change, pallor and wound.  Neurological: Negative for dizziness, tremors, weakness and light-headedness.  Hematological: Negative for adenopathy. Does not bruise/bleed easily.  Psychiatric/Behavioral: Negative for behavioral problems, confusion, sleep disturbance, dysphoric mood, decreased concentration and agitation.  Objective:   Physical Exam  Constitutional: She is oriented to person, place, and time. She appears well-developed and well-nourished. No distress.  HENT:  Head: Normocephalic and atraumatic.  Mouth/Throat: No  oropharyngeal exudate.  Eyes: Conjunctivae and EOM are normal. No scleral icterus.  Neck: Normal range of motion. Neck supple.  Cardiovascular: Normal rate and regular rhythm.   Pulmonary/Chest: Effort normal. No respiratory distress. She has no wheezes.  Abdominal: She exhibits no distension.  Musculoskeletal: She exhibits no edema or tenderness.  Neurological: She is alert and oriented to person, place, and time. She exhibits normal muscle tone. Coordination normal.  Skin: Skin is warm and dry. Rash noted. She is not diaphoretic. No erythema.  Psychiatric: She has a normal mood and affect. Her behavior is normal. Judgment and thought content normal.     Left foot 05/29/2015:      Left foot wrapped in bandage today     Assessment & Plan:   54 year old with calcaneal osteomyelitis and metatarsal infection originally due to fracture also with diabetes mellitus. Above infection was calm permitted by presence of hardware. Now with MRI showing osteomyelitis persisting sp surgery by Dr Sharol Given with deep cultures (though ON abx) and culture unrevealing  --we will place PICC line and give her an 8 week course of IV daptomycin   Rash with doxycycline: did not seem to recur with doxy recently  AKI from vancomycin resolved but we will NOT hazard this abx again  I spent greater than 40 minutes with the patient including greater than 50% of time in face to face counsel of the patient regarding her diabetic foot infection calcaneal osteomyelitis metatarsal infection MRSA colonization and rash with doxycycline as well as acute kidney injury from vancomycin and in coordination of her care for arrangement of PICC line, IV abx.

## 2015-07-31 ENCOUNTER — Other Ambulatory Visit: Payer: Self-pay | Admitting: Infectious Disease

## 2015-07-31 ENCOUNTER — Ambulatory Visit: Payer: BLUE CROSS/BLUE SHIELD | Admitting: Family Medicine

## 2015-07-31 ENCOUNTER — Ambulatory Visit (HOSPITAL_COMMUNITY)
Admission: RE | Admit: 2015-07-31 | Discharge: 2015-07-31 | Disposition: A | Discharge status: Home / Self Care | Payer: BLUE CROSS/BLUE SHIELD | Source: Ambulatory Visit | Attending: Infectious Disease | Admitting: Infectious Disease

## 2015-07-31 ENCOUNTER — Encounter (HOSPITAL_COMMUNITY)
Admission: RE | Admit: 2015-07-31 | Discharge: 2015-07-31 | Disposition: A | Discharge status: Home / Self Care | Payer: BLUE CROSS/BLUE SHIELD | Source: Ambulatory Visit | Attending: Infectious Disease | Admitting: Infectious Disease

## 2015-07-31 ENCOUNTER — Other Ambulatory Visit (HOSPITAL_COMMUNITY): Payer: Self-pay | Admitting: *Deleted

## 2015-07-31 DIAGNOSIS — IMO0001 Reserved for inherently not codable concepts without codable children: Secondary | ICD-10-CM

## 2015-07-31 DIAGNOSIS — Y793 Surgical instruments, materials and orthopedic devices (including sutures) associated with adverse incidents: Secondary | ICD-10-CM | POA: Diagnosis not present

## 2015-07-31 DIAGNOSIS — T814XXA Infection following a procedure, initial encounter: Principal | ICD-10-CM

## 2015-07-31 DIAGNOSIS — T8469XD Infection and inflammatory reaction due to internal fixation device of other site, subsequent encounter: Secondary | ICD-10-CM | POA: Diagnosis present

## 2015-07-31 DIAGNOSIS — X58XXXA Exposure to other specified factors, initial encounter: Secondary | ICD-10-CM | POA: Diagnosis not present

## 2015-07-31 MED ORDER — HEPARIN SOD (PORK) LOCK FLUSH 100 UNIT/ML IV SOLN
INTRAVENOUS | Status: AC
Start: 2015-07-31 — End: 2015-07-31
  Administered 2015-07-31: 500 [IU]
  Filled 2015-07-31: qty 5

## 2015-07-31 MED ORDER — HEPARIN SOD (PORK) LOCK FLUSH 100 UNIT/ML IV SOLN
INTRAVENOUS | Status: AC
  Filled 2015-07-31: qty 5

## 2015-07-31 MED ORDER — LIDOCAINE HCL 1 % IJ SOLN
INTRAMUSCULAR | Status: AC
  Filled 2015-07-31: qty 20

## 2015-07-31 MED ORDER — SODIUM CHLORIDE 0.9 % IV SOLN
6.0000 mg/kg | Freq: Once | INTRAVENOUS | Status: AC
  Administered 2015-07-31: 694 mg via INTRAVENOUS
  Filled 2015-07-31: qty 13.88

## 2015-07-31 MED ORDER — SODIUM CHLORIDE 0.9 % IV SOLN: 6.0000 mg/kg | Freq: Once | INTRAVENOUS | Status: DC

## 2015-07-31 NOTE — Procedures (Signed)
Interventional Radiology Procedure Note  Procedure: Placement of a right brachial approach single lumen PICC.  36cm  Tip is positioned at the superior cavoatrial junction and catheter is ready for immediate use.  Complications: None Recommendations:  - Ok to shower tomorrow - Do not submerge   - Routine line care   Signed,  Dulcy Fanny. Earleen Newport, DO

## 2015-07-31 NOTE — Discharge Instructions (Signed)
Daptomycin injection  What is this medicine?  DAPTOMYCIN (DAP toe MYE sin) is a lipopeptide antibiotic. It is used to treat certain kinds of bacterial infections. It will not work for colds, flu, or other viral infections.  This medicine may be used for other purposes; ask your health care provider or pharmacist if you have questions.  What should I tell my health care provider before I take this medicine?  They need to know if you have any of these conditions:  -kidney disease  -an unusual or allergic reaction to daptomycin, other medicines, foods, dyes, or preservatives  -pregnant or trying to get pregnant  -breast-feeding  How should I use this medicine?  This medicine is for infusion into a vein. It is usually given by a health care professional in a hospital or clinic setting.  If you get this medicine at home, you will be taught how to prepare and give this medicine. Use exactly as directed. Take your medicine at regular intervals. Do not take your medicine more often than directed. Take all of your medicine as directed even if you think you are better. Do not skip doses or stop your medicine early.  It is important that you put your used needles and syringes in a special sharps container. Do not put them in a trash can. If you do not have a sharps container, call your pharmacist or healthcare provider to get one.  Talk to your pediatrician regarding the use of this medicine in children. Special care may be needed.  Overdosage: If you think you have taken too much of this medicine contact a poison control center or emergency room at once.  NOTE: This medicine is only for you. Do not share this medicine with others.  What if I miss a dose?  If you miss a dose, take it as soon as you can. If it is almost time for your next dose, take only that dose. Do not take double or extra doses.  What may interact with this medicine?  -birth control pills  -some antibiotics like tobramycin  This list may not describe all  possible interactions. Give your health care provider a list of all the medicines, herbs, non-prescription drugs, or dietary supplements you use. Also tell them if you smoke, drink alcohol, or use illegal drugs. Some items may interact with your medicine.  What should I watch for while using this medicine?  Your condition will be monitored carefully while you are receiving this medicine.  Do not treat diarrhea with over the counter products. Contact your doctor if you have diarrhea that lasts more than 2 days or if it is severe and watery.  What side effects may I notice from receiving this medicine?  Side effects that you should report to your doctor or health care professional as soon as possible:  -allergic reactions like skin rash, itching or hives, swelling of the face, lips, or tongue  -breathing problems  -fever, infection  -high or low blood pressure  -muscle pain  -numb or tingling pain  -trouble passing urine or change in the amount of urine  -unusually tired or weak  -vomiting  Side effects that usually do not require medical attention (report to your doctor or health care professional if they continue or are bothersome):  -constipation or diarrhea  -trouble sleeping  -headache  -nausea  -stomach upset  This list may not describe all possible side effects. Call your doctor for medical advice about side effects. You may report   side effects to FDA at 1-800-FDA-1088.  Where should I keep my medicine?  Keep out of the reach of children.  If you are using this medicine at home, you will be instructed on how to store this medicine. Throw away any unused medicine after the expiration date on the label.  NOTE: This sheet is a summary. It may not cover all possible information. If you have questions about this medicine, talk to your doctor, pharmacist, or health care provider.      2016, Elsevier/[employer] Standard. (2013-05-04 07:33:48)

## 2015-08-01 ENCOUNTER — Other Ambulatory Visit: Payer: Self-pay | Admitting: Internal Medicine

## 2015-08-08 ENCOUNTER — Encounter: Payer: Self-pay | Admitting: Family Medicine

## 2015-08-08 ENCOUNTER — Ambulatory Visit (INDEPENDENT_AMBULATORY_CARE_PROVIDER_SITE_OTHER): Payer: BLUE CROSS/BLUE SHIELD | Admitting: Family Medicine

## 2015-08-08 VITALS — BP 138/70 | HR 84 | Temp 98.2°F | Wt 257.0 lb

## 2015-08-08 DIAGNOSIS — E1169 Type 2 diabetes mellitus with other specified complication: Secondary | ICD-10-CM | POA: Diagnosis not present

## 2015-08-08 DIAGNOSIS — Z23 Encounter for immunization: Secondary | ICD-10-CM

## 2015-08-08 DIAGNOSIS — I10 Essential (primary) hypertension: Secondary | ICD-10-CM

## 2015-08-08 DIAGNOSIS — M869 Osteomyelitis, unspecified: Secondary | ICD-10-CM

## 2015-08-08 MED ORDER — LOSARTAN POTASSIUM 50 MG PO TABS: 50.0000 mg | ORAL_TABLET | Freq: Every day | ORAL | Status: DC

## 2015-08-08 NOTE — Patient Instructions (Addendum)
Flu shot received today. Thanks!!  So sorry you have been through so much lately! I am glad things seem to be getting better though!   Restart losartan 50mg - 1/2 dose of what you were taking previously. To help with blood pressure trending up some.   Would return to see Dr. Cruzita Lederer  Follow up 4 months

## 2015-08-08 NOTE — Assessment & Plan Note (Signed)
S:Calcaneal osteomyelitis and metatarsal infection- PICC and 8 weeks of abx daptomycin. Patient was on antibiotics at time of debridement and cultures with no growth A/P: continue current rx and ID follow up as well as ortho follow up. Getting labs done through ID she reports so will not repeat today

## 2015-08-08 NOTE — Progress Notes (Signed)
Garret Reddish, MD  Subjective:  [name] is a 54 y.o. year old very pleasant female patient who presents for/with See problem oriented charting ROS- No chest pain. Baseline level of shortness of breath. No headache or blurry vision. Some nausea with daptomycin but no rash.   Past Medical History-  Patient Active Problem List   Diagnosis Date Noted  . Obstructive chronic bronchitis without exacerbation ([place]) 09/18/2013    Priority: High  . Chest pain - at rest, and with exertion; -- angiographically normal coronary arteries, mildly elevated LVEDP 04/11/2013    Priority: High  . Chronic diastolic heart failure, NYHA class 2 ([place])     Priority: High  . Sarcoidosis of lung ([place]) 04/10/2007    Priority: High  . Type II diabetes mellitus with neurological manifestations, uncontrolled ([place]) 08/21/2006    Priority: High  . Fatty liver 09/30/2014    Priority: Medium  . Depression     Priority: Medium  . Gout 08/20/2010    Priority: Medium  . Anemia 09/18/2009    Priority: Medium  . Hyperlipidemia 08/21/2006    Priority: Medium  . Essential hypertension 08/21/2006    Priority: Medium  . Hot flashes 07/15/2014    Priority: Low  . Abnormal SPEP 04/17/2014    Priority: Low  . Fracture of left leg 04/17/2014    Priority: Low  . Cushingoid side effect of steroids ([place]) 04/17/2014    Priority: Low  . Internal hemorrhoids     Priority: Low  . Preoperative clearance 03/25/2014    Priority: Low  . Other and unspecified coagulation defects 04/22/2013    Priority: Low  . Solitary pulmonary nodule, on CT 02/2013 - stable over 2 years in 2015 02/20/2013    Priority: Low  . Chronic cough 12/30/2011    Priority: Low  . Obesity 06/04/2009    Priority: Low  . GERD 08/21/2006    Priority: Low  . Acute osteomyelitis of left calcaneus ([place]) 06/27/2015  . Osteomyelitis of left foot ([place]) 05/29/2015  . Diabetic osteomyelitis ([place]) 05/29/2015  . MRSA (methicillin resistant staph  aureus) culture positive 03/27/2015  . Wound infection complicating hardware ([place]) 03/27/2015  . Unspecified sleep apnea 04/21/2009    Medications- reviewed and updated Current Outpatient Prescriptions  Medication Sig Dispense Refill  . allopurinol (ZYLOPRIM) 100 MG tablet Take 100 mg by mouth at bedtime.     [name] aspirin EC 81 MG tablet Take 81 mg by mouth daily.     [name] atorvastatin (LIPITOR) 40 MG tablet TAKE ONE TABLET BY MOUTH ONE TIME DAILY 90 tablet 3  . B-D ULTRAFINE III SHORT PEN 31G X 8 MM MISC FOLLOW DIRECTIONS PROVIDED BY PHYSICIAN 130 each 2  . buPROPion (WELLBUTRIN XL) 300 MG 24 hr tablet TAKE ONE TABLET BY MOUTH ONE TIME DAILY 30 tablet 8  . CARTIA XT 120 MG 24 hr capsule TAKE ONE CAPSULE BY MOUTH ONE TIME DAILY IN THE EVENING  30 capsule 11  . carvedilol (COREG) 25 MG tablet TAKE ONE TABLET BY MOUTH TWICE DAILY with a meal. 180 tablet 1  . cholecalciferol (VITAMIN D) 1000 UNITS tablet Take 1,000 Units by mouth daily.    . cholecalciferol 5000 UNITS TABS Take 1 tablet (5,000 Units total) by mouth daily. (Patient taking differently: Take 5,000 Units by mouth every morning. ) 30 tablet 3  . DAPTOmycin in sodium chloride 0.9 % 100 mL Inject 8 mg/kg into the vein daily.    [name] DEXILANT 60 MG capsule TAKE  ONE CAPSULE BY MOUTH ONE TIME DAILY 30 capsule 6  . diphenhydrAMINE (BENADRYL) 25 mg capsule Take 1 capsule (25 mg total) by mouth every 6 (six) hours as needed for itching. 15 capsule 0  . estradiol (ESTRACE) 2 MG tablet Take 2 mg by mouth at bedtime.     . fenofibrate (TRICOR) 145 MG tablet TAKE ONE TABLET BY MOUTH ONE TIME DAILY  (Patient taking differently: TAKE ONE TABLET BY MOUTH every evening) 30 tablet 10  . fluticasone (FLONASE) 50 MCG/ACT nasal spray USE TWO SPRAYS IN EACH NOSTRIL DAILY 16 g 4  . furosemide (LASIX) 40 MG tablet Take 1 tablet (40 mg total) by mouth 2 (two) times daily. 60 tablet 4  . gabapentin (NEURONTIN) 100 MG capsule Take 1 capsule (100 mg total) by mouth 3  (three) times daily. **PT NEEDS AN APPT FOR FURTHER REFILLS** 90 capsule 1  . glucose blood (ONE TOUCH ULTRA TEST) test strip 1 each by Other route 3 (three) times daily. Use as instructed 100 each 5  . Insulin Glargine (TOUJEO SOLOSTAR) 300 UNIT/ML SOPN Inject 40 Units into the skin at bedtime. 3 pen 2  . Insulin Pen Needle 31G X 8 MM MISC Use to inject insulin 4 times daily. 130 each 2  . metFORMIN (GLUCOPHAGE) 1000 MG tablet TAKE ONE TABLET BY MOUTH TWICE DAILY with a meal. 180 tablet 2  . mometasone-formoterol (DULERA) 200-5 MCG/ACT AERO Inhale 2 puffs into the lungs 2 (two) times daily. 1 Inhaler 6  . NOVOLOG FLEXPEN 100 UNIT/ML FlexPen INJECT 7-12 UNITS INTO THE SKIN 3 TIMES DAILY WITH MEALS. 15 pen 1  . PARoxetine Mesylate (BRISDELLE) 7.5 MG CAPS Take 7.5 mg by mouth daily.    . potassium chloride SA (K-DUR,KLOR-CON) 20 MEQ tablet Take 1 tablet (20 mEq total) by mouth 3 (three) times daily. 90 tablet 1  . predniSONE (DELTASONE) 5 MG tablet Take 1 tablet (5 mg total) by mouth daily with breakfast. Sig 5mg s daily (Patient taking differently: Take 5 mg by mouth daily with breakfast. )    . saccharomyces boulardii (FLORASTOR) 250 MG capsule Take 250 mg by mouth daily.    [name] venlafaxine XR (EFFEXOR-XR) 75 MG 24 hr capsule Take 1 capsule (75 mg total) by mouth 2 (two) times daily. 20 capsule 0  . vitamin B-12 (CYANOCOBALAMIN) 1000 MCG tablet Take 1,000 mcg by mouth daily.    [name] albuterol (PROVENTIL HFA;VENTOLIN HFA) 108 (90 BASE) MCG/ACT inhaler Inhale 1-2 puffs into the lungs every 6 (six) hours as needed for wheezing or shortness of breath. For wheezing.    . chlorpheniramine-HYDROcodone (TUSSIONEX) 10-8 MG/5ML SUER Take 5 mLs by mouth 2 (two) times daily as needed for cough.   0  . HYDROcodone-acetaminophen (NORCO) 10-325 MG per tablet Take 1-2 tablets by mouth every 4 (four) hours as needed for moderate pain.   0  . nitroGLYCERIN (NITROSTAT) 0.4 MG SL tablet Place 0.4 mg under the tongue every 5  (five) minutes as needed. x3 doses as needed for chest pain.    [name] ondansetron (ZOFRAN-ODT) 4 MG disintegrating tablet Take 4 mg by mouth every 8 (eight) hours as needed for nausea or vomiting.   0   Objective: BP 138/70 mmHg  Pulse 84  Temp(Src) 98.2 F (36.8 C)  Wt 257 lb (116.574 kg) Gen: NAD, resting comfortably CV: RRR no murmurs rubs or gallops Lungs: CTAB no crackles, wheeze, rhonchi Abdomen: soft/nontender/nondistended/normal bowel sounds. No rebound or guarding.  Ext: no edema Skin: warm, dry,  cushingoid features Neuro: grossly normal, moves all extremities  Diabetic Foot Exam - Simple   Simple Foot Form  Visual Inspection  See comments:  Yes  Sensation Testing  See comments:  Yes  Pulse Check  Posterior Tibialis and Dorsalis pulse intact bilaterally:  Yes  Comments  No sensation until upper thigh with monofilament. Healing calcaneal osteomyelitis external scar- on daptomycin as well      Assessment/Plan:  Essential hypertension S: controlled. On  Cartia XT 120mg , carvedilol 25 mg BID. Had been off losartan due to tightly controlled BP in 110s and creatinine elevation. Recheck of creatinine was 0.94 in September though. Some of this may have been vancomycin toxicity BP Readings from Last 3 Encounters:  08/08/15 138/70  07/31/15 115/50  07/30/15 117/73  A/P:Continue current meds:  But add back losartan at lower dose 50mg  given trend up in BP   Diabetic osteomyelitis S:Calcaneal osteomyelitis and metatarsal infection- PICC and 8 weeks of abx daptomycin. Patient was on antibiotics at time of debridement and cultures with no growth A/P: continue current rx and ID follow up as well as ortho follow up. Getting labs done through ID she reports so will not repeat today  4 months with me as long as keeping regular ortho and ID follow up though i am glad to see prn sooner. Encouraged endocrine follow up  Orders Placed This Encounter  Procedures  . Flu Vaccine QUAD 36+  mos IM    Meds ordered this encounter  Medications  . losartan (COZAAR) 50 MG tablet    Sig: Take 1 tablet (50 mg total) by mouth daily.    Dispense:  90 tablet    Refill:  3

## 2015-08-08 NOTE — Assessment & Plan Note (Signed)
S: controlled. On  Cartia XT 120mg , carvedilol 25 mg BID. Had been off losartan due to tightly controlled BP in 110s and creatinine elevation. Recheck of creatinine was 0.94 in September though. Some of this may have been vancomycin toxicity BP Readings from Last 3 Encounters:  08/08/15 138/70  07/31/15 115/50  07/30/15 117/73  A/P:Continue current meds:  But add back losartan at lower dose 50mg  given trend up in BP

## 2015-08-11 ENCOUNTER — Telehealth: Payer: Self-pay | Admitting: *Deleted

## 2015-08-11 DIAGNOSIS — L299 Pruritus, unspecified: Secondary | ICD-10-CM

## 2015-08-11 NOTE — Telephone Encounter (Signed)
Pt with generalized itching - requesting Benadryl rx refill.  MD please advise.

## 2015-08-12 MED ORDER — DIPHENHYDRAMINE HCL 25 MG PO CAPS: 25.0000 mg | ORAL_CAPSULE | Freq: Four times a day (QID) | ORAL | Status: DC | PRN

## 2015-08-12 NOTE — Telephone Encounter (Addendum)
The pt is having a reaction to the daptomycin.  She is able to obtain the rx for "free" if it is sent as a rx.

## 2015-08-12 NOTE — Telephone Encounter (Signed)
She doesn't need a rx for benadryl it is OTC. Is this from her doxycyline?

## 2015-08-12 NOTE — Addendum Note (Signed)
Addended by: Lorne Skeens D on: 08/12/2015 03:58 PM   Modules accepted: Orders

## 2015-08-18 NOTE — Telephone Encounter (Signed)
Patient nurse called to advise that she now has a blotchy red rash on her face, upper chest and top of buttock. She advised the patient has been taking the benadryl but the rash is not there in spite of this. She is holding her dose of medication until she hears from the doctor she has been on the medication for 4 weeks at this point. Advised will ask the doctor and give them a call back asap.

## 2015-08-18 NOTE — Telephone Encounter (Signed)
Spoke with [facility] pharmacist, Amy.  Order to stop Daptomycin, start Telfaro via PICC repeated back by Amy.

## 2015-08-18 NOTE — Telephone Encounter (Signed)
I would ask them to change her to [facility] dosed by pharmacy probably at 600mg  q 12 hours and stop her dapto

## 2015-08-18 NOTE — Telephone Encounter (Signed)
Pt informed of medication change and to stop Daptomycin.  Pt verbalized understanding.

## 2015-08-18 NOTE — Telephone Encounter (Addendum)
[name] at [facility] and advised her of the change to Telflaro 600 mg q12. Advised the patient has a worsening rash and can not take dapto. She checked the insurance and it should cover it 100 %. Advised if they run into any trouble getting it paid for please let us know.  Called the patient to advise her of the change and had to leave a message to not take anymore Dapto and [facility] will be calling her about new medication.

## 2015-08-18 NOTE — Telephone Encounter (Signed)
Very good 

## 2015-08-20 ENCOUNTER — Other Ambulatory Visit: Payer: Self-pay

## 2015-08-20 MED ORDER — CARVEDILOL 25 MG PO TABS: ORAL_TABLET | ORAL | Status: DC

## 2015-08-28 ENCOUNTER — Encounter: Payer: Self-pay | Admitting: Internal Medicine

## 2015-09-01 NOTE — Telephone Encounter (Signed)
Rash/itching on chest/buttocks not improving.  Benadryl too sedative.  [facility] RN requesting a call back to discuss.  [phone]

## 2015-09-01 NOTE — Telephone Encounter (Signed)
She is having rash on teflaro now. So rashes with clinda, doxy, dapto and teflaro--hard to believe it is truly allergies to all of these meds. Stopping her IV abx, pulling PICC and she can followup a liittle sooner than her scheduled appt

## 2015-09-09 DIAGNOSIS — H25812 Combined forms of age-related cataract, left eye: Secondary | ICD-10-CM | POA: Diagnosis not present

## 2015-09-11 ENCOUNTER — Encounter: Payer: Self-pay | Admitting: Infectious Disease

## 2015-09-11 ENCOUNTER — Ambulatory Visit (INDEPENDENT_AMBULATORY_CARE_PROVIDER_SITE_OTHER): Payer: BLUE CROSS/BLUE SHIELD | Admitting: Infectious Disease

## 2015-09-11 VITALS — BP 152/84 | HR 88 | Temp 98.0°F | Wt 253.0 lb

## 2015-09-11 DIAGNOSIS — M86672 Other chronic osteomyelitis, left ankle and foot: Secondary | ICD-10-CM

## 2015-09-11 DIAGNOSIS — E1169 Type 2 diabetes mellitus with other specified complication: Secondary | ICD-10-CM | POA: Diagnosis not present

## 2015-09-11 DIAGNOSIS — M86172 Other acute osteomyelitis, left ankle and foot: Secondary | ICD-10-CM

## 2015-09-11 DIAGNOSIS — T847XXD Infection and inflammatory reaction due to other internal orthopedic prosthetic devices, implants and grafts, subsequent encounter: Secondary | ICD-10-CM

## 2015-09-11 DIAGNOSIS — E1140 Type 2 diabetes mellitus with diabetic neuropathy, unspecified: Secondary | ICD-10-CM

## 2015-09-11 DIAGNOSIS — IMO0002 Reserved for concepts with insufficient information to code with codable children: Secondary | ICD-10-CM

## 2015-09-11 DIAGNOSIS — Z794 Long term (current) use of insulin: Secondary | ICD-10-CM

## 2015-09-11 DIAGNOSIS — E1165 Type 2 diabetes mellitus with hyperglycemia: Secondary | ICD-10-CM

## 2015-09-11 DIAGNOSIS — M869 Osteomyelitis, unspecified: Secondary | ICD-10-CM

## 2015-09-11 LAB — BASIC METABOLIC PANEL WITH GFR
BUN: 23 mg/dL (ref 7–25)
CO2: 25 mmol/L (ref 20–31)
Calcium: 9.5 mg/dL (ref 8.6–10.4)
Chloride: 97 mmol/L — ABNORMAL LOW (ref 98–110)
Creat: 1.09 mg/dL — ABNORMAL HIGH (ref 0.50–1.05)
GFR, Est African American: 66 mL/min (ref >=60)
GFR, Est Non African American: 57 mL/min — ABNORMAL LOW (ref >=60)
Glucose, Bld: 184 mg/dL — ABNORMAL HIGH (ref 65–99)
Potassium: 4.2 mmol/L (ref 3.5–5.3)
Sodium: 135 mmol/L (ref 135–146)

## 2015-09-11 LAB — C-REACTIVE PROTEIN: CRP: 2.6 mg/dL — ABNORMAL HIGH (ref <0.60)

## 2015-09-11 NOTE — Progress Notes (Signed)
Chief complaint: followup for calcaneal osteomyelitis with worsening pain since coming off of antibiotics  Subjective:    Patient ID: [name], female    DOB: 05/26/1961, 54 y.o.   MRN: [id number]  HPI   54 year old female with multiple medical problems who initially had a fracture of her 5th metatarsal and developed non union. Underwent ORIF with bone grafting but then fell and broke the 5th metatarsal again. She underwent hardware removal and ORIF again  with midfoot reconstruction. The calcaneal osteotomy did not heal well and in May 2016 noted draiange, which she describes as white, pus like drainage. Developed swelling of foot and leg. She was started on Septra DS, 2 pills bid with rifampin though did not resolve. Underwent I and D in [place] with associated hardware removal but presented here to Dr. Percell Miller with persistent drainage. She underwent I and D and superficial swab with MRSA and CoNS, no bone culture done. She was started on and has continued with vancomycin and rifampin. Unfortunately, she developed AKI and was reshospitalized. Her creat was increased to 1.87 and vancomycin stopped and started linezolid. Her creat remained stable, though still up from her baseline at 1.68 after her visit to Dr. Yong Channel. She was sent out on doxycyline after her insurace refused linezolid coverage. She developed a rash with this but continued the doxycyline, until seen by Dr. Linus Salmons roughly 16 days ago. She was taken off doxycycline but since has seen Dr. Para March who is concerned that the patient had worsening left heel pain which is hard for her now to bear. Patient had labs done which showed an elevated sedimentation rate above 50. 4 to layer creatinine had now improved to 1.35. See labs of lobe picture.  She had  been restarted on doxycycline and has had re-emergence of her rash is intensely pruritic  When I later saw  her I was concerned for worsening osteomyelitis and obtained MRI  of foot on 06/03/15 which showed:  IMPRESSION: 1. There is evidence of prior calcaneal osteotomy of the left posterior calcaneus with hardware removal. There is a soft tissue wound along the posterior lateral aspect of the left hindfoot extending to the lateral osteotomy site of the calcaneus. There is soft tissue enhancement and osseous enhancement with mild cortical irregularity along the lateral aspect of the calcaneal osteotomy site which may reflect postsurgical granulation tissue, but osteomyelitis cannot be completely excluded. No drainable fluid collection.  She was seen b y Dr. Sharol Given with orthopedic surgery. I had stopped her doxycyline in hopes that we might obtain helpful cultures in the OR to guide therapy if she underwent surgery   She  was seen by Dr Sharol Given who restarted her oral doxycycline and then admitted her to [facility] where he performed Partial Excision Left Calcaneus, Place Antibiotic Beads, and Wound VAC on 06/27/15. The patient states that she has not been on ANY antibiotics postoperatively whatsoever. Her cultures on doxycycline did not yield any organism. She has NOT had worsening of pain or drainage since her surgery which was a month prior to my seeing her again. I DID propose however that she undergo course of rx with IV antibiotics to maximize her chances of control, cure of this calcaneal osteomyelitis and we started IV daptomicin to which she apparently developed a rash. This was stopped and she was changed to IV teflaro which she also developed a rash. She was NOT able to get through the 8 weeks of IV abx we had  planned instead roughly 32 days of IV antibiotics. We did not switch her to an oral antibiotic due to her multiple allergies.   Since coming off antibiotics she has experienced worsening pain in her heel both with weight bearing and at rest. No nausea vomiting, fevers or systemic symptoms.   Past Medical History  Diagnosis Date  . DIABETES MELLITUS, TYPE  II 08/21/2006  . HYPERLIPIDEMIA 08/21/2006  . GOUT 08/20/2010  . OBESITY 06/04/2009  . ANEMIA-UNSPECIFIED 09/18/2009  . HYPERTENSION 08/21/2006  . GERD 08/21/2006  . Internal hemorrhoids   . Pulmonary sarcoidosis ([place])     Followed locally by pulmonology, but also by Dr. Casper Harrison at [facility]  . Exertional chest pain     sharp, substernal, exertional  . Vocal cord dysfunction   . Chronic diastolic heart failure, NYHA class 2 (HCC)     LVEDP roughly 20% by cath  . CHF (congestive heart failure) ([place])   . COPD (chronic obstructive pulmonary disease) ([place])   . Depression   . Fracture of 5th metatarsal     non union  . Abnormal SPEP 04/17/2014  . Osteomyelitis of left foot ([place]) 05/29/2015  . Diabetic osteomyelitis ([place]) 05/29/2015  . Hx of umbilical hernia repair   . Infection of wound due to methicillin resistant Staphylococcus aureus (MRSA)   . Wears partial dentures     Past Surgical History  Procedure Laterality Date  . Ventral hernia repair    . Nissen fundoplication  [id number]  . Cholecystectomy  1984  . Abdominal hysterectomy    . Knee arthroscopy      right  . Tubal ligation      with reversal in 1994  . Bladder suspension  11/11/2011    Procedure: TRANSVAGINAL TAPE (TVT) PROCEDURE;  Surgeon: Olga Millers, MD;  Location: [facility];  Service: Gynecology;  Laterality: N/A;  . Cystoscopy  11/11/2011    Procedure: CYSTOSCOPY;  Surgeon: Olga Millers, MD;  Location: [facility];  Service: Gynecology;  Laterality: N/A;  . Doppler echocardiography  02/12/2013    LV FUNCTION, SIZE NORMAL; MILD CONCENTRIC LVH; EST EF 55-65%; WALL MOTION NORMAL  . Lexiscan myoview  03/09/2013    EF 50%; NORMAL MYOCARDIAL PERFUSION STUDY - breast attenuation  . Cardiac catheterization  07/2010    LVEF 50-55% WITH VERY MILD GLOBAL HYPOKINESIA; ESSENTIALLY NORMAL CORONARY ARTERIES; NORMAL LV FUNCTION  . Carotids  02/18/11    CAROTID DUPLEX; VERTEBRALS ARE PATENT WITH ANTEGRADE FLOW. ICA/CCA RATIO  1.61 ON RIGHT AND 0.75 ON LEFT  . Right and left cardiac catheterization  04/23/2013    Angiographic normal coronaries; LVEDP 20 mmHg, PCWP 12-14 mmHg, RAP 12 mmHg.; Fick CO/CI 4.9/2.2  . Metatarsal osteotomy with open reduction internal fixation (orif) metatarsal with fusion Left 04/09/2014    Procedure: LEFT FOOT FRACTURE OPEN TREATMENT METATARSAL INCLUDES INTERNAL FIXATION EACH;  Surgeon: Lorn Junes, MD;  Location: [facility];  Service: Orthopedics;  Laterality: Left;  . Left and right heart catheterization with coronary angiogram N/A 04/23/2013    Procedure: LEFT AND RIGHT HEART CATHETERIZATION WITH CORONARY ANGIOGRAM;  Surgeon: Leonie Man, MD;  Location: [facility] CATH LAB;  Service: Cardiovascular;  Laterality: N/A;  . Hernia repair    . Appendectomy    . Fracture surgery    . I&d extremity Left 06/27/2015    Procedure: Partial Excision Left Calcaneus, Place Antibiotic Beads, and Wound VAC;  Surgeon: Newt Minion, MD;  Location: [facility];  Service: Orthopedics;  Laterality: Left;    Family History  Problem Relation Age of Onset  . Diabetes Father   . Heart attack Father   . Coronary artery disease Father   . Heart failure Father   . COPD Mother   . Emphysema Mother   . Asthma Mother   . Heart failure Mother   . Sarcoidosis Maternal Uncle   . Colon cancer Neg Hx   . Lung cancer Brother   . Cancer Brother   . Diabetes Brother   . Heart attack Maternal Grandfather       Social History   Social History  . Marital Status: Married    Spouse Name: AVA FRADKIN  . Number of Children: 2  . Years of Education: 12   Occupational History  . DISABLED    Social History Main Topics  . Smoking status: Never Smoker   . Smokeless tobacco: Never Used  . Alcohol Use: No  . Drug Use: No  . Sexual Activity: Yes    Birth Control/ Protection: Surgical   Other Topics Concern  . None   Social History Narrative   Married 1994. 2 sons who both live close and 1  grandson.       Disability due to sarcoidosis. 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 Vancomycin Other (See Comments)    nephrotoxicity  . Chlorhexidine Itching  . Lisinopril Cough  . Methotrexate Other (See Comments)     peri-oral and buccal lesions.  . Clindamycin/Lincomycin Nausea And Vomiting and Rash  . Doxycycline Rash  . Teflaro [Ceftaroline] Rash     Current outpatient prescriptions:  .  albuterol (PROVENTIL HFA;VENTOLIN HFA) 108 (90 BASE) MCG/ACT inhaler, Inhale 1-2 puffs into the lungs every 6 (six) hours as needed for wheezing or shortness of breath. For wheezing., Disp: , Rfl:  .  allopurinol (ZYLOPRIM) 100 MG tablet, Take 100 mg by mouth at bedtime. , Disp: , Rfl:  .  aspirin EC 81 MG tablet, Take 81 mg by mouth daily. , Disp: , Rfl:  .  atorvastatin (LIPITOR) 40 MG tablet, TAKE ONE TABLET BY MOUTH ONE TIME DAILY, Disp: 90 tablet, Rfl: 3 .  B-D ULTRAFINE III SHORT PEN 31G X 8 MM MISC, FOLLOW DIRECTIONS PROVIDED BY PHYSICIAN, Disp: 130 each, Rfl: 2 .  buPROPion (WELLBUTRIN XL) 300 MG 24 hr tablet, TAKE ONE TABLET BY MOUTH ONE TIME DAILY, Disp: 30 tablet, Rfl: 8 .  CARTIA XT 120 MG 24 hr capsule, TAKE ONE CAPSULE BY MOUTH ONE TIME DAILY IN THE EVENING , Disp: 30 capsule, Rfl: 11 .  carvedilol (COREG) 25 MG tablet, TAKE ONE TABLET BY MOUTH TWICE DAILY with a meal., Disp: 60 tablet, Rfl: 0 .  chlorpheniramine-HYDROcodone (TUSSIONEX) 10-8 MG/5ML SUER, Take 5 mLs by mouth 2 (two) times daily as needed for cough. , Disp: , Rfl: 0 .  cholecalciferol (VITAMIN D) 1000 UNITS tablet, Take 1,000 Units by mouth daily., Disp: , Rfl:  .  cholecalciferol 5000 UNITS TABS, Take 1 tablet (5,000 Units total) by mouth daily. (Patient taking differently: Take 5,000 Units by mouth every morning. ), Disp: 30 tablet, Rfl:  3 .  DEXILANT 60 MG capsule, TAKE ONE CAPSULE BY MOUTH ONE TIME DAILY, Disp: 30 capsule, Rfl: 6 .  diphenhydrAMINE (BENADRYL) 25 mg capsule, Take 1  capsule (25 mg total) by mouth every 6 (six) hours as needed for itching., Disp: 15 capsule, Rfl: 0 .  estradiol (ESTRACE) 2 MG tablet, Take 2 mg by mouth at bedtime. , Disp: , Rfl:  .  fenofibrate (TRICOR) 145 MG tablet, TAKE ONE TABLET BY MOUTH ONE TIME DAILY  (Patient taking differently: TAKE ONE TABLET BY MOUTH every evening), Disp: 30 tablet, Rfl: 10 .  fluticasone (FLONASE) 50 MCG/ACT nasal spray, USE TWO SPRAYS IN EACH NOSTRIL DAILY, Disp: 16 g, Rfl: 4 .  furosemide (LASIX) 40 MG tablet, Take 1 tablet (40 mg total) by mouth 2 (two) times daily., Disp: 60 tablet, Rfl: 4 .  gabapentin (NEURONTIN) 100 MG capsule, Take 1 capsule (100 mg total) by mouth 3 (three) times daily. **PT NEEDS AN APPT FOR FURTHER REFILLS**, Disp: 90 capsule, Rfl: 1 .  glucose blood (ONE TOUCH ULTRA TEST) test strip, 1 each by Other route 3 (three) times daily. Use as instructed, Disp: 100 each, Rfl: 5 .  HYDROcodone-acetaminophen (NORCO) 10-325 MG per tablet, Take 1-2 tablets by mouth every 4 (four) hours as needed for moderate pain. , Disp: , Rfl: 0 .  Insulin Glargine (TOUJEO SOLOSTAR) 300 UNIT/ML SOPN, Inject 40 Units into the skin at bedtime., Disp: 3 pen, Rfl: 2 .  Insulin Pen Needle 31G X 8 MM MISC, Use to inject insulin 4 times daily., Disp: 130 each, Rfl: 2 .  losartan (COZAAR) 50 MG tablet, Take 1 tablet (50 mg total) by mouth daily., Disp: 90 tablet, Rfl: 3 .  metFORMIN (GLUCOPHAGE) 1000 MG tablet, TAKE ONE TABLET BY MOUTH TWICE DAILY with a meal., Disp: 180 tablet, Rfl: 2 .  mometasone-formoterol (DULERA) 200-5 MCG/ACT AERO, Inhale 2 puffs into the lungs 2 (two) times daily., Disp: 1 Inhaler, Rfl: 6 .  nitroGLYCERIN (NITROSTAT) 0.4 MG SL tablet, Place 0.4 mg under the tongue every 5 (five) minutes as needed. x3 doses as needed for chest pain., Disp: , Rfl:  .   NOVOLOG FLEXPEN 100 UNIT/ML FlexPen, INJECT 7-12 UNITS INTO THE SKIN 3 TIMES DAILY WITH MEALS., Disp: 15 pen, Rfl: 1 .  ondansetron (ZOFRAN-ODT) 4 MG disintegrating tablet, Take 4 mg by mouth every 8 (eight) hours as needed for nausea or vomiting. , Disp: , Rfl: 0 .  PARoxetine Mesylate (BRISDELLE) 7.5 MG CAPS, Take 7.5 mg by mouth daily., Disp: , Rfl:  .  potassium chloride SA (K-DUR,KLOR-CON) 20 MEQ tablet, Take 1 tablet (20 mEq total) by mouth 3 (three) times daily., Disp: 90 tablet, Rfl: 1 .  predniSONE (DELTASONE) 5 MG tablet, Take 1 tablet (5 mg total) by mouth daily with breakfast. Sig 5mg s daily (Patient taking differently: Take 5 mg by mouth daily with breakfast. ), Disp: , Rfl:  .  saccharomyces boulardii (FLORASTOR) 250 MG capsule, Take 250 mg by mouth daily., Disp: , Rfl:  .  venlafaxine XR (EFFEXOR-XR) 75 MG 24 hr capsule, Take 1 capsule (75 mg total) by mouth 2 (two) times daily., Disp: 20 capsule, Rfl: 0 .  vitamin B-12 (CYANOCOBALAMIN) 1000 MCG tablet, Take 1,000 mcg by mouth daily., Disp: , Rfl:       Review of Systems  Constitutional: Negative for fever, chills, diaphoresis, activity change, appetite change, fatigue and unexpected weight change.  HENT: Negative for congestion, rhinorrhea, sinus pressure, sneezing, sore throat and trouble swallowing.   Eyes: Negative for photophobia and visual disturbance.  Respiratory: Negative for cough, chest tightness, shortness of breath, wheezing and stridor.   Cardiovascular: Negative for chest  pain, palpitations and leg swelling.  Gastrointestinal: Negative for nausea, vomiting, abdominal pain, diarrhea, constipation, blood in stool, abdominal distention and anal bleeding.  Genitourinary: Negative for dysuria, hematuria, flank pain and difficulty urinating.  Musculoskeletal: Positive for arthralgias. Negative for myalgias, back pain, joint swelling and gait problem.  Skin: Positive for rash. Negative for color change, pallor and wound.    Neurological: Negative for dizziness, tremors, weakness and light-headedness.  Hematological: Negative for adenopathy. Does not bruise/bleed easily.  Psychiatric/Behavioral: Negative for behavioral problems, confusion, sleep disturbance, dysphoric mood, decreased concentration and agitation.       Objective:   Physical Exam  Constitutional: She is oriented to person, place, and time. She appears well-developed and well-nourished. No distress.  HENT:  Head: Normocephalic and atraumatic.  Mouth/Throat: No oropharyngeal exudate.  Eyes: Conjunctivae and EOM are normal. No scleral icterus.  Neck: Normal range of motion. Neck supple.  Cardiovascular: Normal rate and regular rhythm.   Pulmonary/Chest: Effort normal. No respiratory distress. She has no wheezes.  Abdominal: She exhibits no distension.  Musculoskeletal: She exhibits no edema or tenderness.  Neurological: She is alert and oriented to person, place, and time. She exhibits normal muscle tone. Coordination normal.  Skin: Skin is warm and dry. Rash noted. She is not diaphoretic. No erythema.  Psychiatric: She has a normal mood and affect. Her behavior is normal. Judgment and thought content normal.     Left foot 05/29/2015:      Left foot 09/11/15: note a new scar from where she dropped a curling iron on her foot      Right foot:          Assessment & Plan:   53 year old with calcaneal osteomyelitis and metatarsal infection originally due to fracture also with diabetes mellitus. Above infection was complicated by presence of hardware. MRI in August showed osteomyelitis persisting sp surgery by Dr Sharol Given with deep cultures (though ON abx) and culture unrevealing. We tried IV abx but only got through 4 of planned 56 days due to rashes to every IV abx we had tried  I will check her inflammatory markers today.  If they are up will pursue repeat MRI given worsening of symptoms off of antibiotics  Multiple allergies on  various antibiotics: wonder if many of these are driven by supratentorial or in fact subconscious suggestion or if she ireally is allergic to all of these different classes of antibiotics   AKI from vancomycin resolved   I spent greater than 25   minutes with the patient including greater than 50% of time in face to face counsel of the patient regarding her diabetic foot infection calcaneal osteomyelitis metatarsal infection MRSA colonization and rashes, allergies with various abx y from vancomycin and in coordination of her care.

## 2015-09-12 ENCOUNTER — Telehealth: Payer: Self-pay | Admitting: Infectious Disease

## 2015-09-12 DIAGNOSIS — M86272 Subacute osteomyelitis, left ankle and foot: Secondary | ICD-10-CM

## 2015-09-12 LAB — SEDIMENTATION RATE: Sed Rate: 36 mm/hr — ABNORMAL HIGH (ref 0–30)

## 2015-09-12 NOTE — Telephone Encounter (Signed)
Her ESR and CRP are still up  I would like to get an MRI of her left heel repeated 

## 2015-09-22 ENCOUNTER — Telehealth: Payer: Self-pay | Admitting: *Deleted

## 2015-09-22 NOTE — Telephone Encounter (Signed)
Patient aware of this appointment.

## 2015-09-22 NOTE — Telephone Encounter (Signed)
Called patient and left a voice mail to return my call. She has an appt for MRI on Saturday 09/27/15 at 4:45 pm at [facility] located at [address] In [place]. # if needing to reschedule is [phone]. This has been approved for one month.

## 2015-09-24 ENCOUNTER — Ambulatory Visit: Payer: BLUE CROSS/BLUE SHIELD | Admitting: Infectious Disease

## 2015-09-25 ENCOUNTER — Ambulatory Visit (INDEPENDENT_AMBULATORY_CARE_PROVIDER_SITE_OTHER): Payer: BLUE CROSS/BLUE SHIELD | Admitting: Pulmonary Disease

## 2015-09-25 ENCOUNTER — Encounter: Payer: Self-pay | Admitting: Pulmonary Disease

## 2015-09-25 VITALS — BP 140/74 | HR 75 | Ht 66.0 in | Wt 255.2 lb

## 2015-09-25 DIAGNOSIS — D869 Sarcoidosis, unspecified: Secondary | ICD-10-CM

## 2015-09-25 DIAGNOSIS — J449 Chronic obstructive pulmonary disease, unspecified: Secondary | ICD-10-CM

## 2015-09-25 MED ORDER — MOMETASONE FURO-FORMOTEROL FUM 200-5 MCG/ACT IN AERO: 2.0000 | INHALATION_SPRAY | Freq: Two times a day (BID) | RESPIRATORY_TRACT | Status: DC

## 2015-09-25 MED ORDER — HYDROCOD POLST-CPM POLST ER 10-8 MG/5ML PO SUER: 5.0000 mL | Freq: Two times a day (BID) | ORAL | Status: DC | PRN

## 2015-09-25 MED ORDER — ALBUTEROL SULFATE HFA 108 (90 BASE) MCG/ACT IN AERS: 1.0000 | INHALATION_SPRAY | Freq: Four times a day (QID) | RESPIRATORY_TRACT | Status: DC | PRN

## 2015-09-25 NOTE — Progress Notes (Signed)
Subjective:    Patient ID: [name], female    DOB: Mar 11, 1961, 54 y.o.   MRN: [id number]  PROBLEM LIST: Sarcoidosis Chronic bronchitis Vocal cord dysfunction  HPI  Follow-up for sarcoidosis, bronchitis. Mrs. Nunnery is a 54 year old former patient of Dr. Joya Gaskins. She was diagnosed with sarcoidosis with mediastinoscopy in 2008. He is followed by Dr. Casper Harrison in [facility] with regular biannual follow-ups. She had been on high doses of prednisone. This has been slowly tapered to 5 mg a day. She is being maintained on this for several years now without any exacerbations. Review of her lung function tests and imaging show moderate restrictive defects with mild reduction in DLCO. The CT scan does not show any obvious pulmonary involvement with her sarcoidosis. Her sarcoidosis is believed to have an airway component as well since she has recurrent attacks of bronchitis. She is also diagnosed with vocal cord dysfunction by Dr. Joya Gaskins several years ago.  In the clinic today she feels well with no complaints related to her lungs. She denies any cough, sputum production, dyspnea, wheezing, hemoptysis.  DATA: CT chest (07/03/13) No CT findings to suggest sarcoidosis. A single tiny punctate indeterminate nodule is noted. The nodule's oval configuration makes an intrapulmonary lymph node or other benign entity more likely. If patient has no risk factors for lung cancer, no  further followup would be warranted.  Chest x-ray 05/07/15 No acute cardiopulmonary disease.  PFTs 10/22/14 FVC 2.12 (57%) FEV1 1.57 (54%) F/F 74 DLCO 74.5%  03/25/14  FVC 2.09 [60%] FEV1 1.63 (59%] F/F 78  Surgical pathology, mediastinal lymph nodes 11/07/06 Nonnecrotizing granulomas.  Past Medical History  Diagnosis Date  . DIABETES MELLITUS, TYPE II 08/21/2006  . HYPERLIPIDEMIA 08/21/2006  . GOUT 08/20/2010  . OBESITY 06/04/2009  . ANEMIA-UNSPECIFIED 09/18/2009  . HYPERTENSION 08/21/2006  . GERD 08/21/2006  .  Internal hemorrhoids   . Pulmonary sarcoidosis ([place])     Followed locally by pulmonology, but also by Dr. Casper Harrison at [facility]  . Exertional chest pain     sharp, substernal, exertional  . Vocal cord dysfunction   . Chronic diastolic heart failure, NYHA class 2 (HCC)     LVEDP roughly 20% by cath  . CHF (congestive heart failure) ([place])   . COPD (chronic obstructive pulmonary disease) ([place])   . Depression   . Fracture of 5th metatarsal     non union  . Abnormal SPEP 04/17/2014  . Osteomyelitis of left foot ([place]) 05/29/2015  . Diabetic osteomyelitis ([place]) 05/29/2015  . Hx of umbilical hernia repair   . Infection of wound due to methicillin resistant Staphylococcus aureus (MRSA)   . Wears partial dentures      Current outpatient prescriptions:  .  albuterol (PROVENTIL HFA;VENTOLIN HFA) 108 (90 BASE) MCG/ACT inhaler, Inhale 1-2 puffs into the lungs every 6 (six) hours as needed for wheezing or shortness of breath., Disp: 8 g, Rfl: 5 .  allopurinol (ZYLOPRIM) 100 MG tablet, Take 100 mg by mouth at bedtime. , Disp: , Rfl:  .  aspirin EC 81 MG tablet, Take 81 mg by mouth daily. , Disp: , Rfl:  .  atorvastatin (LIPITOR) 40 MG tablet, TAKE ONE TABLET BY MOUTH ONE TIME DAILY, Disp: 90 tablet, Rfl: 3 .  B-D ULTRAFINE III SHORT PEN 31G X 8 MM MISC, FOLLOW DIRECTIONS PROVIDED BY PHYSICIAN, Disp: 130 each, Rfl: 2 .  buPROPion (WELLBUTRIN XL) 300 MG 24 hr tablet, TAKE ONE TABLET BY MOUTH ONE TIME DAILY, Disp: 30  tablet, Rfl: 8 .  CARTIA XT 120 MG 24 hr capsule, TAKE ONE CAPSULE BY MOUTH ONE TIME DAILY IN THE EVENING , Disp: 30 capsule, Rfl: 11 .  carvedilol (COREG) 25 MG tablet, TAKE ONE TABLET BY MOUTH TWICE DAILY with a meal., Disp: 60 tablet, Rfl: 0 .  chlorpheniramine-HYDROcodone (TUSSIONEX) 10-8 MG/5ML SUER, Take 5 mLs by mouth every 12 (twelve) hours as needed for cough., Disp: 120 mL, Rfl: 0 .  cholecalciferol 5000 UNITS TABS, Take 1 tablet (5,000 Units total) by mouth daily.  (Patient taking differently: Take 5,000 Units by mouth every morning. ), Disp: 30 tablet, Rfl: 3 .  DEXILANT 60 MG capsule, TAKE ONE CAPSULE BY MOUTH ONE TIME DAILY, Disp: 30 capsule, Rfl: 6 .  diphenhydrAMINE (BENADRYL) 25 mg capsule, Take 1 capsule (25 mg total) by mouth every 6 (six) hours as needed for itching., Disp: 15 capsule, Rfl: 0 .  estradiol (ESTRACE) 2 MG tablet, Take 2 mg by mouth at bedtime. , Disp: , Rfl:  .  fenofibrate (TRICOR) 145 MG tablet, TAKE ONE TABLET BY MOUTH ONE TIME DAILY  (Patient taking differently: TAKE ONE TABLET BY MOUTH every evening), Disp: 30 tablet, Rfl: 10 .  fluticasone (FLONASE) 50 MCG/ACT nasal spray, USE TWO SPRAYS IN EACH NOSTRIL DAILY, Disp: 16 g, Rfl: 4 .  furosemide (LASIX) 40 MG tablet, Take 1 tablet (40 mg total) by mouth 2 (two) times daily., Disp: 60 tablet, Rfl: 4 .  gabapentin (NEURONTIN) 100 MG capsule, Take 1 capsule (100 mg total) by mouth 3 (three) times daily. **PT NEEDS AN APPT FOR FURTHER REFILLS**, Disp: 90 capsule, Rfl: 1 .  glucose blood (ONE TOUCH ULTRA TEST) test strip, 1 each by Other route 3 (three) times daily. Use as instructed, Disp: 100 each, Rfl: 5 .  HYDROcodone-acetaminophen (NORCO) 10-325 MG per tablet, Take 1-2 tablets by mouth every 4 (four) hours as needed for moderate pain. , Disp: , Rfl: 0 .  Insulin Glargine (TOUJEO SOLOSTAR) 300 UNIT/ML SOPN, Inject 40 Units into the skin at bedtime., Disp: 3 pen, Rfl: 2 .  Insulin Pen Needle 31G X 8 MM MISC, Use to inject insulin 4 times daily., Disp: 130 each, Rfl: 2 .  losartan (COZAAR) 50 MG tablet, Take 1 tablet (50 mg total) by mouth daily., Disp: 90 tablet, Rfl: 3 .  metFORMIN (GLUCOPHAGE) 1000 MG tablet, TAKE ONE TABLET BY MOUTH TWICE DAILY with a meal., Disp: 180 tablet, Rfl: 2 .  mometasone-formoterol (DULERA) 200-5 MCG/ACT AERO, Inhale 2 puffs into the lungs 2 (two) times daily., Disp: 13 g, Rfl: 5 .  nitroGLYCERIN (NITROSTAT) 0.4 MG SL tablet, Place 0.4 mg under the tongue  every 5 (five) minutes as needed. x3 doses as needed for chest pain., Disp: , Rfl:  .  NOVOLOG FLEXPEN 100 UNIT/ML FlexPen, INJECT 7-12 UNITS INTO THE SKIN 3 TIMES DAILY WITH MEALS., Disp: 15 pen, Rfl: 1 .  ondansetron (ZOFRAN-ODT) 4 MG disintegrating tablet, Take 4 mg by mouth every 8 (eight) hours as needed for nausea or vomiting. , Disp: , Rfl: 0 .  PARoxetine Mesylate (BRISDELLE) 7.5 MG CAPS, Take 7.5 mg by mouth daily., Disp: , Rfl:  .  potassium chloride SA (K-DUR,KLOR-CON) 20 MEQ tablet, Take 1 tablet (20 mEq total) by mouth 3 (three) times daily., Disp: 90 tablet, Rfl: 1 .  prednisoLONE acetate (PRED FORTE) 1 % ophthalmic suspension, Place 1 drop into the left eye 4 (four) times daily., Disp: , Rfl: 0 .  predniSONE (DELTASONE) 5  MG tablet, Take 1 tablet (5 mg total) by mouth daily with breakfast. Sig 5mg s daily (Patient taking differently: Take 5 mg by mouth daily with breakfast. ), Disp: , Rfl:  .  saccharomyces boulardii (FLORASTOR) 250 MG capsule, Take 250 mg by mouth daily., Disp: , Rfl:  .  venlafaxine XR (EFFEXOR-XR) 75 MG 24 hr capsule, Take 1 capsule (75 mg total) by mouth 2 (two) times daily., Disp: 20 capsule, Rfl: 0 .  vitamin B-12 (CYANOCOBALAMIN) 1000 MCG tablet, Take 1,000 mcg by mouth daily., Disp: , Rfl:    Review of Systems Denies any cough, wheezing, sputum production, hemoptysis Denies any chest pain, palpitations. Denies any nausea, vomiting, diarrhea, constipation. Denies any fevers, chills, malaise, fatigue, loss of weight, loss of appetite. All other review of systems are negative.    Objective:   Physical Exam  Blood pressure 140/74, pulse 75, height 5\' 6"  (1.676 m), weight 255 lb 3.2 oz (115.758 kg), SpO2 98 %.  Gen: No apparent distress Neuro: No gross focal deficits. Neck: No JVD, lymphadenopathy, thyromegaly. RS: Clear, no wheeze, crackles. [name]: S1-S2 heard, no murmurs rubs gallops. Abdomen: Soft, positive bowel sounds. Extremities: No edema.      Assessment & Plan:  #1 Sarcoidosis She is stable with no recent exacerbations. She has moderate restrictive defect which is likely are related to her habitus, obesity. CT scan in the past did not show any parenchymal involvement by sarcoidosis but there may be some interstitial process that is not obvious on the CAT scan. She is being followed by Dr. Casper Harrison at [facility] and is maintained on prednisone at 5 mg.  #2 Chronic bronchitis, vocal cord dysfunction. She suffers from recurrent bronchitis although there is no obvious obstruction on spirometry in the past. She is stable on Dulera and albuterol when necessary. She'll continue the same.  Plan: - Continue Dulera, albuterol rescue inhaler - Prednisone at 5 mg daily - Follow up at [facility] Pager [phone] If no answer or after 3pm call: [phone] 09/25/2015, 2:52 PM

## 2015-09-25 NOTE — Patient Instructions (Signed)
Continue using inhalers and prednisone at 5 mg.  Return to clinic in 6 months.

## 2015-09-27 ENCOUNTER — Other Ambulatory Visit: Payer: BLUE CROSS/BLUE SHIELD

## 2015-09-27 ENCOUNTER — Ambulatory Visit
Admission: RE | Admit: 2015-09-27 | Discharge: 2015-09-27 | Disposition: A | Discharge status: Home / Self Care | Payer: BLUE CROSS/BLUE SHIELD | Source: Ambulatory Visit | Attending: Infectious Disease | Admitting: Infectious Disease

## 2015-09-27 DIAGNOSIS — M86272 Subacute osteomyelitis, left ankle and foot: Secondary | ICD-10-CM

## 2015-09-27 MED ORDER — GADOBENATE DIMEGLUMINE 529 MG/ML IV SOLN
20.0000 mL | Freq: Once | INTRAVENOUS | Status: DC | PRN
Start: 2015-09-27 — End: 2015-09-28

## 2015-09-29 ENCOUNTER — Other Ambulatory Visit: Payer: Self-pay | Admitting: Cardiology

## 2015-09-30 ENCOUNTER — Telehealth: Payer: Self-pay

## 2015-09-30 ENCOUNTER — Other Ambulatory Visit: Payer: Self-pay | Admitting: Pharmacist Clinician (PhC)/ Clinical Pharmacy Specialist

## 2015-09-30 MED ORDER — CARVEDILOL 25 MG PO TABS: ORAL_TABLET | ORAL | Status: DC

## 2015-09-30 NOTE — Telephone Encounter (Signed)
MRI actually shows improvement in her foot and no evidence of bone infection that is active based on THIS Scan

## 2015-09-30 NOTE — Telephone Encounter (Signed)
09-29-15 Patient is calling for MRI results. She was told it would be available to Dr Tommy Medal on Monday.   Dr Tommy Medal please advise of MRI results.   Laverle Patter, RN

## 2015-09-30 NOTE — Telephone Encounter (Signed)
Rx(s) sent to pharmacy electronically.  

## 2015-10-02 ENCOUNTER — Other Ambulatory Visit: Payer: Self-pay

## 2015-10-02 MED ORDER — VENLAFAXINE HCL ER 75 MG PO CP24: 75.0000 mg | ORAL_CAPSULE | Freq: Two times a day (BID) | ORAL | Status: DC

## 2015-10-02 NOTE — Telephone Encounter (Signed)
Rx request for venlafaxine HCL ER 75 mg capsule- Take 1 capsule by mouth twice daily #180.  Pharmacy: [name] Rx sent to pharmacy.

## 2015-10-03 ENCOUNTER — Other Ambulatory Visit: Payer: Self-pay | Admitting: Family Medicine

## 2015-10-03 MED ORDER — VENLAFAXINE HCL ER 75 MG PO CP24: 75.0000 mg | ORAL_CAPSULE | Freq: Two times a day (BID) | ORAL | Status: DC

## 2015-10-03 MED ORDER — BUPROPION HCL ER (XL) 300 MG PO TB24: 300.0000 mg | ORAL_TABLET | Freq: Every day | ORAL | Status: DC

## 2015-10-04 ENCOUNTER — Encounter (HOSPITAL_COMMUNITY): Payer: Self-pay

## 2015-10-04 ENCOUNTER — Emergency Department (HOSPITAL_COMMUNITY)
Admission: EM | Admit: 2015-10-04 | Discharge: 2015-10-04 | Disposition: A | Discharge status: Home / Self Care | Payer: BLUE CROSS/BLUE SHIELD | Attending: Emergency Medicine | Admitting: Emergency Medicine

## 2015-10-04 DIAGNOSIS — F329 Major depressive disorder, single episode, unspecified: Secondary | ICD-10-CM | POA: Insufficient documentation

## 2015-10-04 DIAGNOSIS — Z8614 Personal history of Methicillin resistant Staphylococcus aureus infection: Secondary | ICD-10-CM | POA: Diagnosis not present

## 2015-10-04 DIAGNOSIS — J449 Chronic obstructive pulmonary disease, unspecified: Secondary | ICD-10-CM | POA: Diagnosis not present

## 2015-10-04 DIAGNOSIS — I5032 Chronic diastolic (congestive) heart failure: Secondary | ICD-10-CM | POA: Diagnosis not present

## 2015-10-04 DIAGNOSIS — Z8781 Personal history of (healed) traumatic fracture: Secondary | ICD-10-CM | POA: Insufficient documentation

## 2015-10-04 DIAGNOSIS — E669 Obesity, unspecified: Secondary | ICD-10-CM | POA: Diagnosis not present

## 2015-10-04 DIAGNOSIS — R1110 Vomiting, unspecified: Secondary | ICD-10-CM | POA: Diagnosis present

## 2015-10-04 DIAGNOSIS — K219 Gastro-esophageal reflux disease without esophagitis: Secondary | ICD-10-CM | POA: Diagnosis not present

## 2015-10-04 DIAGNOSIS — Z794 Long term (current) use of insulin: Secondary | ICD-10-CM | POA: Insufficient documentation

## 2015-10-04 DIAGNOSIS — Z98811 Dental restoration status: Secondary | ICD-10-CM | POA: Diagnosis not present

## 2015-10-04 DIAGNOSIS — Z7951 Long term (current) use of inhaled steroids: Secondary | ICD-10-CM | POA: Insufficient documentation

## 2015-10-04 DIAGNOSIS — M109 Gout, unspecified: Secondary | ICD-10-CM | POA: Diagnosis not present

## 2015-10-04 DIAGNOSIS — E119 Type 2 diabetes mellitus without complications: Secondary | ICD-10-CM | POA: Diagnosis not present

## 2015-10-04 DIAGNOSIS — Z7901 Long term (current) use of anticoagulants: Secondary | ICD-10-CM | POA: Diagnosis not present

## 2015-10-04 DIAGNOSIS — Z7982 Long term (current) use of aspirin: Secondary | ICD-10-CM | POA: Diagnosis not present

## 2015-10-04 DIAGNOSIS — E785 Hyperlipidemia, unspecified: Secondary | ICD-10-CM | POA: Insufficient documentation

## 2015-10-04 DIAGNOSIS — I10 Essential (primary) hypertension: Secondary | ICD-10-CM | POA: Insufficient documentation

## 2015-10-04 DIAGNOSIS — Z862 Personal history of diseases of the blood and blood-forming organs and certain disorders involving the immune mechanism: Secondary | ICD-10-CM | POA: Diagnosis not present

## 2015-10-04 DIAGNOSIS — Z79899 Other long term (current) drug therapy: Secondary | ICD-10-CM | POA: Insufficient documentation

## 2015-10-04 DIAGNOSIS — Z7984 Long term (current) use of oral hypoglycemic drugs: Secondary | ICD-10-CM | POA: Insufficient documentation

## 2015-10-04 DIAGNOSIS — A084 Viral intestinal infection, unspecified: Secondary | ICD-10-CM

## 2015-10-04 LAB — URINALYSIS, ROUTINE W REFLEX MICROSCOPIC
Glucose, UA: NEGATIVE mg/dL
Hgb urine dipstick: NEGATIVE
Ketones, ur: NEGATIVE mg/dL
Leukocytes, UA: NEGATIVE
Nitrite: NEGATIVE
Protein, ur: NEGATIVE mg/dL
Specific Gravity, Urine: 1.021 (ref 1.005–1.030)
pH: 5 (ref 5.0–8.0)

## 2015-10-04 LAB — COMPREHENSIVE METABOLIC PANEL
ALT: 35 U/L (ref 14–54)
AST: 82 U/L — ABNORMAL HIGH (ref 15–41)
Albumin: 3.3 g/dL — ABNORMAL LOW (ref 3.5–5.0)
Alkaline Phosphatase: 107 U/L (ref 38–126)
Anion gap: 13 (ref 5–15)
BUN: 17 mg/dL (ref 6–20)
CO2: 23 mmol/L (ref 22–32)
Calcium: 8.7 mg/dL — ABNORMAL LOW (ref 8.9–10.3)
Chloride: 100 mmol/L — ABNORMAL LOW (ref 101–111)
Creatinine, Ser: 1.09 mg/dL — ABNORMAL HIGH (ref 0.44–1.00)
GFR calc Af Amer: >60 mL/min (ref >60)
GFR calc non Af Amer: 56 mL/min — ABNORMAL LOW (ref >60)
Glucose, Bld: 238 mg/dL — ABNORMAL HIGH (ref 65–99)
Potassium: 3.9 mmol/L (ref 3.5–5.1)
Sodium: 136 mmol/L (ref 135–145)
Total Bilirubin: 1.1 mg/dL (ref 0.3–1.2)
Total Protein: 6.4 g/dL — ABNORMAL LOW (ref 6.5–8.1)

## 2015-10-04 LAB — CBC
HCT: 33.8 % — ABNORMAL LOW (ref 36.0–46.0)
Hemoglobin: 10.8 g/dL — ABNORMAL LOW (ref 12.0–15.0)
MCH: 28.6 pg (ref 26.0–34.0)
MCHC: 32 g/dL (ref 30.0–36.0)
MCV: 89.4 fL (ref 78.0–100.0)
Platelets: 262 10*3/uL (ref 150–400)
RBC: 3.78 MIL/uL — ABNORMAL LOW (ref 3.87–5.11)
RDW: 13.5 % (ref 11.5–15.5)
WBC: 9.7 10*3/uL (ref 4.0–10.5)

## 2015-10-04 LAB — LIPASE, BLOOD: Lipase: 18 U/L (ref 11–51)

## 2015-10-04 MED ORDER — ONDANSETRON HCL 4 MG/2ML IJ SOLN
4.0000 mg | Freq: Once | INTRAMUSCULAR | Status: AC
  Administered 2015-10-04: 4 mg via INTRAVENOUS
  Filled 2015-10-04: qty 2

## 2015-10-04 MED ORDER — ACETAMINOPHEN 325 MG PO TABS
650.0000 mg | ORAL_TABLET | Freq: Once | ORAL | Status: AC
  Administered 2015-10-04: 650 mg via ORAL
  Filled 2015-10-04: qty 2

## 2015-10-04 MED ORDER — IBUPROFEN 800 MG PO TABS
800.0000 mg | ORAL_TABLET | Freq: Once | ORAL | Status: AC
  Administered 2015-10-04: 800 mg via ORAL
  Filled 2015-10-04: qty 1

## 2015-10-04 MED ORDER — PROMETHAZINE HCL 25 MG PO TABS
25.0000 mg | ORAL_TABLET | Freq: Four times a day (QID) | ORAL | Status: DC | PRN
Start: 2015-10-04 — End: 2015-12-05

## 2015-10-04 MED ORDER — SODIUM CHLORIDE 0.9 % IV BOLUS (SEPSIS)
1000.0000 mL | Freq: Once | INTRAVENOUS | Status: AC
  Administered 2015-10-04: 1000 mL via INTRAVENOUS

## 2015-10-04 MED ORDER — METOCLOPRAMIDE HCL 5 MG/ML IJ SOLN
10.0000 mg | Freq: Once | INTRAMUSCULAR | Status: AC
Start: 2015-10-04 — End: 2015-10-04
  Administered 2015-10-04: 10 mg via INTRAVENOUS
  Filled 2015-10-04: qty 2

## 2015-10-04 MED ORDER — PROMETHAZINE HCL 25 MG PO TABS: 25.0000 mg | ORAL_TABLET | Freq: Four times a day (QID) | ORAL | Status: DC | PRN

## 2015-10-04 NOTE — Discharge Instructions (Signed)
Take tylenol or ibuprofen for fever control   Viral Gastroenteritis Viral gastroenteritis is also known as stomach flu. This condition affects the stomach and intestinal tract. It can cause sudden diarrhea and vomiting. The illness typically lasts 3 to 8 days. Most people develop an immune response that eventually gets rid of the virus. While this natural response develops, the virus can make you quite ill. CAUSES  Many different viruses can cause gastroenteritis, such as rotavirus or noroviruses. You can catch one of these viruses by consuming contaminated food or water. You may also catch a virus by sharing utensils or other personal items with an infected person or by touching a contaminated surface. SYMPTOMS  The most common symptoms are diarrhea and vomiting. These problems can cause a severe loss of body fluids (dehydration) and a body salt (electrolyte) imbalance. Other symptoms may include:  Fever.  Headache.  Fatigue.  Abdominal pain. DIAGNOSIS  Your caregiver can usually diagnose viral gastroenteritis based on your symptoms and a physical exam. A stool sample may also be taken to test for the presence of viruses or other infections. TREATMENT  This illness typically goes away on its own. Treatments are aimed at rehydration. The most serious cases of viral gastroenteritis involve vomiting so severely that you are not able to keep fluids down. In these cases, fluids must be given through an intravenous line (IV). HOME CARE INSTRUCTIONS   Drink enough fluids to keep your urine clear or pale yellow. Drink small amounts of fluids frequently and increase the amounts as tolerated.  Ask your caregiver for specific rehydration instructions.  Avoid:  Foods high in sugar.  Alcohol.  Carbonated drinks.  Tobacco.  Juice.  Caffeine drinks.  Extremely hot or cold fluids.  Fatty, greasy foods.  Too much intake of anything at one time.  Dairy products until 24 to 48 hours  after diarrhea stops.  You may consume probiotics. Probiotics are active cultures of beneficial bacteria. They may lessen the amount and number of diarrheal stools in adults. Probiotics can be found in yogurt with active cultures and in supplements.  Wash your hands well to avoid spreading the virus.  Only take over-the-counter or prescription medicines for pain, discomfort, or fever as directed by your caregiver. Do not give aspirin to children. Antidiarrheal medicines are not recommended.  Ask your caregiver if you should continue to take your regular prescribed and over-the-counter medicines.  Keep all follow-up appointments as directed by your caregiver. SEEK IMMEDIATE MEDICAL CARE IF:   You are unable to keep fluids down.  You do not urinate at least once every 6 to 8 hours.  You develop shortness of breath.  You notice blood in your stool or vomit. This may look like coffee grounds.  You have abdominal pain that increases or is concentrated in one small area (localized).  You have persistent vomiting or diarrhea.  You have a fever.  The patient is a child younger than 3 months, and he or she has a fever.  The patient is a child older than 3 months, and he or she has a fever and persistent symptoms.  The patient is a child older than 3 months, and he or she has a fever and symptoms suddenly get worse.  The patient is a baby, and he or she has no tears when crying. MAKE SURE YOU:   Understand these instructions.  Will watch your condition.  Will get help right away if you are not doing well or get worse.  This information is not intended to replace advice given to you by your health care provider. Make sure you discuss any questions you have with your health care provider.   Document Released: 09/27/2005 Document Revised: 12/20/2011 Document Reviewed: 07/14/2011 Elsevier Interactive Patient Education [employer].

## 2015-10-04 NOTE — ED Provider Notes (Signed)
CSN: [id number]     Arrival date & time 10/04/15  [id number] History   First MD Initiated Contact with Patient 10/04/15 [phone]     Chief Complaint  Patient presents with  . Emesis   HPI  [name] is a 54 year old female with PMHx of DM, obesity, HTN, sarcoidosis, CHF and COPD presenting with nausea, vomiting, abdominal pain and diarrhea. Onset of symptoms were last evening. She states that her abdominal pain is generalized and states that it "just hurts". She also notes a soreness in the epigastric region that is most severe after episodes of vomiting. She reports multiple episodes of non-bloody diarrhea since last evening. She reports that is no longer vomiting up anything and is only dry heaving. Denies blood in the vomit. She had Zofran and phenergan at home which she took with moderate relief. She has not tried any OTC pain relievers. She reports watching her nephews child a few days ago who was having diarrhea but no vomiting. She reports that her nephew is also ill with similar symptoms. She denies fevers but is febrile upon presentation today. Denies chills, headache, syncope, dizziness, weakness, chest pain, SOB, dysuria, myalgias or rash.   Past Medical History  Diagnosis Date  . DIABETES MELLITUS, TYPE II 08/21/2006  . HYPERLIPIDEMIA 08/21/2006  . GOUT 08/20/2010  . OBESITY 06/04/2009  . ANEMIA-UNSPECIFIED 09/18/2009  . HYPERTENSION 08/21/2006  . GERD 08/21/2006  . Internal hemorrhoids   . Pulmonary sarcoidosis ([place])     Followed locally by pulmonology, but also by Dr. Casper Harrison at [facility]  . Exertional chest pain     sharp, substernal, exertional  . Vocal cord dysfunction   . Chronic diastolic heart failure, NYHA class 2 (HCC)     LVEDP roughly 20% by cath  . CHF (congestive heart failure) ([place])   . COPD (chronic obstructive pulmonary disease) ([place])   . Depression   . Fracture of 5th metatarsal     non union  . Abnormal SPEP 04/17/2014  . Osteomyelitis of left foot  ([place]) 05/29/2015  . Diabetic osteomyelitis ([place]) 05/29/2015  . Hx of umbilical hernia repair   . Infection of wound due to methicillin resistant Staphylococcus aureus (MRSA)   . Wears partial dentures    Past Surgical History  Procedure Laterality Date  . Ventral hernia repair    . Nissen fundoplication  [id number]  . Cholecystectomy  1984  . Abdominal hysterectomy    . Knee arthroscopy      right  . Tubal ligation      with reversal in 1994  . Bladder suspension  11/11/2011    Procedure: TRANSVAGINAL TAPE (TVT) PROCEDURE;  Surgeon: Olga Millers, MD;  Location: [facility];  Service: Gynecology;  Laterality: N/A;  . Cystoscopy  11/11/2011    Procedure: CYSTOSCOPY;  Surgeon: Olga Millers, MD;  Location: [facility];  Service: Gynecology;  Laterality: N/A;  . Doppler echocardiography  02/12/2013    LV FUNCTION, SIZE NORMAL; MILD CONCENTRIC LVH; EST EF 55-65%; WALL MOTION NORMAL  . Lexiscan myoview  03/09/2013    EF 50%; NORMAL MYOCARDIAL PERFUSION STUDY - breast attenuation  . Cardiac catheterization  07/2010    LVEF 50-55% WITH VERY MILD GLOBAL HYPOKINESIA; ESSENTIALLY NORMAL CORONARY ARTERIES; NORMAL LV FUNCTION  . Carotids  02/18/11    CAROTID DUPLEX; VERTEBRALS ARE PATENT WITH ANTEGRADE FLOW. ICA/CCA RATIO 1.61 ON RIGHT AND 0.75 ON LEFT  . Right and left cardiac catheterization  04/23/2013    Angiographic  normal coronaries; LVEDP 20 mmHg, PCWP 12-14 mmHg, RAP 12 mmHg.; Fick CO/CI 4.9/2.2  . Metatarsal osteotomy with open reduction internal fixation (orif) metatarsal with fusion Left 04/09/2014    Procedure: LEFT FOOT FRACTURE OPEN TREATMENT METATARSAL INCLUDES INTERNAL FIXATION EACH;  Surgeon: Lorn Junes, MD;  Location: [facility];  Service: Orthopedics;  Laterality: Left;  . Left and right heart catheterization with coronary angiogram N/A 04/23/2013    Procedure: LEFT AND RIGHT HEART CATHETERIZATION WITH CORONARY ANGIOGRAM;  Surgeon: Leonie Man, MD;  Location: [facility] CATH LAB;   Service: Cardiovascular;  Laterality: N/A;  . Hernia repair    . Appendectomy    . Fracture surgery    . I&d extremity Left 06/27/2015    Procedure: Partial Excision Left Calcaneus, Place Antibiotic Beads, and Wound VAC;  Surgeon: Newt Minion, MD;  Location: [facility];  Service: Orthopedics;  Laterality: Left;   Family History  Problem Relation Age of Onset  . Diabetes Father   . Heart attack Father   . Coronary artery disease Father   . Heart failure Father   . COPD Mother   . Emphysema Mother   . Asthma Mother   . Heart failure Mother   . Sarcoidosis Maternal Uncle   . Colon cancer Neg Hx   . Lung cancer Brother   . Cancer Brother   . Diabetes Brother   . Heart attack Maternal Grandfather    Social History  Substance Use Topics  . Smoking status: Never Smoker   . Smokeless tobacco: Never Used  . Alcohol Use: No   OB History    No data available     Review of Systems  Constitutional: Positive for fever. Negative for chills and diaphoresis.  HENT: Negative.   Respiratory: Negative for shortness of breath.   Cardiovascular: Negative for chest pain.  Gastrointestinal: Positive for nausea, vomiting, abdominal pain and diarrhea. Negative for blood in stool.  Genitourinary: Negative.   Musculoskeletal: Negative for myalgias and neck pain.  Neurological: Negative for dizziness, syncope and headaches.  All other systems reviewed and are negative.     Allergies  Vancomycin; Chlorhexidine; Lisinopril; Methotrexate; Clindamycin/lincomycin; Doxycycline; and Teflaro  Home Medications   Prior to Admission medications   Medication Sig Start Date End Date Taking? Authorizing Provider  acetaminophen (TYLENOL) 325 MG tablet Take 650 mg by mouth every 6 (six) hours as needed for headache.   Yes Historical Provider, MD  albuterol (PROVENTIL HFA;VENTOLIN HFA) 108 (90 BASE) MCG/ACT inhaler Inhale 1-2 puffs into the lungs every 6 (six) hours as needed for wheezing or shortness of  breath. 09/25/15  Yes Praveen Mannam, MD  allopurinol (ZYLOPRIM) 100 MG tablet Take 100 mg by mouth at bedtime.    Yes Historical Provider, MD  aspirin EC 81 MG tablet Take 81 mg by mouth daily.    Yes Historical Provider, MD  atorvastatin (LIPITOR) 40 MG tablet TAKE ONE TABLET BY MOUTH ONE TIME DAILY 05/06/15  Yes [name], MD  B-D ULTRAFINE III SHORT PEN 31G X 8 MM MISC FOLLOW DIRECTIONS PROVIDED BY PHYSICIAN 05/06/15  Yes [name], MD  buPROPion (WELLBUTRIN XL) 300 MG 24 hr tablet Take 1 tablet (300 mg total) by mouth daily. 10/03/15  Yes [name], MD  CARTIA XT 120 MG 24 hr capsule TAKE ONE CAPSULE BY MOUTH ONE TIME DAILY IN THE EVENING  10/03/14  Yes Leonie Man, MD  carvedilol (COREG) 25 MG tablet TAKE ONE TABLET BY  MOUTH TWICE DAILY with a meal. 09/30/15  Yes Leonie Man, MD  chlorpheniramine-HYDROcodone (TUSSIONEX) 10-8 MG/5ML SUER Take 5 mLs by mouth every 12 (twelve) hours as needed for cough. 09/25/15  Yes Praveen Mannam, MD  cholecalciferol 5000 UNITS TABS Take 1 tablet (5,000 Units total) by mouth daily. Patient taking differently: Take 5,000 Units by mouth every morning.  04/09/14  Yes Kirstin Shepperson, PA-C  DEXILANT 60 MG capsule TAKE ONE CAPSULE BY MOUTH ONE TIME DAILY 03/14/15  Yes Elsie Stain, MD  diphenhydrAMINE (BENADRYL) 25 mg capsule Take 1 capsule (25 mg total) by mouth every 6 (six) hours as needed for itching. 08/12/15  Yes Truman Hayward, MD  estradiol (ESTRACE) 2 MG tablet Take 2 mg by mouth at bedtime.  01/24/14  Yes Historical Provider, MD  fenofibrate (TRICOR) 145 MG tablet TAKE ONE TABLET BY MOUTH ONE TIME DAILY  Patient taking differently: TAKE ONE TABLET BY MOUTH every evening 11/05/14  Yes Leonie Man, MD  fluticasone [facility]) 50 MCG/ACT nasal spray USE TWO SPRAYS IN [place] NOSTRIL DAILY 02/03/15  Yes [name], MD  furosemide (LASIX) 40 MG tablet Take 1 tablet (40 mg total) by mouth 2 (two) times daily. 05/01/15  Yes  Thurnell Lose, MD  gabapentin (NEURONTIN) 100 MG capsule Take 1 capsule (100 mg total) by mouth 3 (three) times daily. **PT NEEDS AN APPT FOR FURTHER REFILLS** 07/21/15  Yes Philemon Kingdom, MD  glucose blood (ONE TOUCH ULTRA TEST) test strip 1 each by Other route 3 (three) times daily. Use as instructed 10/19/13  Yes Bruce Kendall Flack, MD  HYDROcodone-acetaminophen (NORCO) 10-325 MG per tablet Take 1-2 tablets by mouth every 4 (four) hours as needed for moderate pain.  03/20/15  Yes Historical Provider, MD  Insulin Glargine (TOUJEO SOLOSTAR) 300 UNIT/ML SOPN Inject 40 Units into the skin at bedtime. 10/24/14  Yes Philemon Kingdom, MD  Insulin Pen Needle 31G X 8 MM MISC Use to inject insulin 4 times daily. 04/30/15  Yes Philemon Kingdom, MD  losartan (COZAAR) 50 MG tablet Take 1 tablet (50 mg total) by mouth daily. 08/08/15  Yes [name], MD  metFORMIN (GLUCOPHAGE) 1000 MG tablet TAKE ONE TABLET BY MOUTH TWICE DAILY with a meal. 12/29/14  Yes Philemon Kingdom, MD  mometasone-formoterol (DULERA) 200-5 MCG/ACT AERO Inhale 2 puffs into the lungs 2 (two) times daily. 09/25/15  Yes Praveen Mannam, MD  nitroGLYCERIN (NITROSTAT) 0.4 MG SL tablet Place 0.4 mg under the tongue every 5 (five) minutes as needed. x3 doses as needed for chest pain.   Yes Historical Provider, MD  NOVOLOG FLEXPEN 100 UNIT/ML FlexPen INJECT 7-12 UNITS INTO THE SKIN 3 TIMES DAILY WITH MEALS. 08/01/15  Yes Philemon Kingdom, MD  ondansetron (ZOFRAN-ODT) 4 MG disintegrating tablet Take 4 mg by mouth every 8 (eight) hours as needed for nausea or vomiting.  03/31/15  Yes Historical Provider, MD  PARoxetine Mesylate (BRISDELLE) 7.5 MG CAPS Take 7.5 mg by mouth daily.   Yes Historical Provider, MD  potassium chloride SA (K-DUR,KLOR-CON) 20 MEQ tablet Take 1 tablet (20 mEq total) by mouth 3 (three) times daily. 05/01/15  Yes Thurnell Lose, MD  predniSONE (DELTASONE) 5 MG tablet Take 1 tablet (5 mg total) by mouth daily with breakfast. Sig  5mg s daily Patient taking differently: Take 5 mg by mouth daily with breakfast.  09/27/14  Yes Samella Parr, NP  venlafaxine XR (EFFEXOR-XR) 75 MG 24 hr capsule Take 1 capsule (75 mg total)  by mouth 2 (two) times daily. 10/03/15  Yes [name], MD  vitamin B-12 (CYANOCOBALAMIN) 1000 MCG tablet Take 1,000 mcg by mouth daily.   Yes Historical Provider, MD  furosemide (LASIX) 40 MG tablet Take 1 tablet (40 mg total) by mouth 2 (two) times daily. PATIENT NEEDS TO CONTACT OFFICE FOR ADDITIONAL REFILLS Patient not taking: Reported on 10/04/2015 09/30/15   Leonie Man, MD  promethazine (PHENERGAN) 25 MG tablet Take 1 tablet (25 mg total) by mouth every 6 (six) hours as needed for nausea or vomiting. 10/04/15   Fatema Rabe, PA-C   BP 120/79 mmHg  Pulse 90  Temp(Src) 100.4 F (38 C) (Oral)  Resp 24  Ht 5\' 6"  (1.676 m)  Wt 113.399 kg  BMI 40.37 kg/m2  SpO2 93% Physical Exam  Constitutional: She appears well-developed and well-nourished. No distress.  Dry heaving intermittently  HENT:  Head: Normocephalic and atraumatic.  Eyes: Conjunctivae are normal. Right eye exhibits no discharge. Left eye exhibits no discharge. No scleral icterus.  Neck: Normal range of motion.  Cardiovascular: Normal rate and regular rhythm.   Pulmonary/Chest: Effort normal. No respiratory distress.  Abdominal: Soft. She exhibits no distension. Bowel sounds are increased. There is no tenderness. There is no rebound and no guarding.  Obese  Musculoskeletal: Normal range of motion.  Neurological: She is alert. Coordination normal.  Skin: Skin is warm and dry.  Psychiatric: She has a normal mood and affect. Her behavior is normal.  Nursing note and vitals reviewed.   ED Course  Procedures (including critical care time) Labs Review Labs Reviewed  COMPREHENSIVE METABOLIC PANEL - Abnormal; Notable for the following:    Chloride 100 (*)    Glucose, Bld 238 (*)    Creatinine, Ser 1.09 (*)    Calcium 8.7  (*)    Total Protein 6.4 (*)    Albumin 3.3 (*)    AST 82 (*)    GFR calc non Af Amer 56 (*)    All other components within normal limits  CBC - Abnormal; Notable for the following:    RBC 3.78 (*)    Hemoglobin 10.8 (*)    HCT 33.8 (*)    All other components within normal limits  URINALYSIS, ROUTINE W REFLEX MICROSCOPIC (NOT AT [facility]) - Abnormal; Notable for the following:    Color, Urine AMBER (*)    Bilirubin Urine SMALL (*)    All other components within normal limits  GASTROINTESTINAL PANEL BY PCR, STOOL (REPLACES STOOL CULTURE)  LIPASE, BLOOD    Imaging Review No results found. I have personally reviewed and evaluated these images and lab results as part of my medical decision-making.   EKG Interpretation None      MDM   Final diagnoses:  Viral gastroenteritis   Patient presenting with abdominal pain, nausea, vomiting and diarrhea. Symptoms consistent with viral gastroenteritis with two known sick contacts. Fluid bolus, zofran, phenergan, tylenol and ibuprofen given in ED. Pt reports full resolution of nausea and vomiting in ED but states she had 3 loose BMs during time spent in ED. Pt noted to be febrile to 102.1 which came down to 100.4 at discharge. Pt slightly tachycardic to 101 in triage which came down to 89-90 after fluids. Mucus membranes are moist. Abdomen is soft with no focal tenderness and no peritoneal signs. Discussed that there is no indication for imaging at this time. Blood work reviewed and unremarkable. Hgb and creatinine at baseline. Patient is tolerating PO liquids before  discharge. Discussed pt with Dr. Laneta Simmers who recommends discharge home with symptomatic care. Will discharge home with phenergan. Strict return precautions discussed with patient at bedside and given in discharge paperwork. Patient is stable for discharge.   Filed Vitals:   10/04/15 1500 10/04/15 1530  BP: 116/70 120/79  Pulse: 89 90  Temp:  100.4 F (38 C)  Resp:  24     Kaire Stary, PA-C 10/04/15 1920  Leo Grosser, MD 10/05/15 [phone]

## 2015-10-04 NOTE — ED Notes (Signed)
Per Pt, Pt started to have abdominal pain, nausea, vomiting, and diarrhea starting last night at about 2200. Pt reports being around a nephew who has had diarrhea a couple days ago. Denies any new foods.

## 2015-10-05 ENCOUNTER — Telehealth (HOSPITAL_COMMUNITY): Payer: Self-pay

## 2015-10-05 LAB — GASTROINTESTINAL PANEL BY PCR, STOOL (REPLACES STOOL CULTURE)
Adenovirus F40/41: NOT DETECTED
Astrovirus: NOT DETECTED
Campylobacter species: NOT DETECTED
Cryptosporidium: NOT DETECTED
Cyclospora cayetanensis: NOT DETECTED
E. coli O157: NOT DETECTED
Entamoeba histolytica: NOT DETECTED
Enteroaggregative E coli (EAEC): NOT DETECTED
Enteropathogenic E coli (EPEC): NOT DETECTED
Enterotoxigenic E coli (ETEC): NOT DETECTED
Giardia lamblia: NOT DETECTED
Norovirus GI/GII: DETECTED — AB
Plesimonas shigelloides: NOT DETECTED
Rotavirus A: NOT DETECTED
Salmonella species: NOT DETECTED
Sapovirus (I, II, IV, and V): NOT DETECTED
Shiga like toxin producing E coli (STEC): NOT DETECTED
Shigella/Enteroinvasive E coli (EIEC): NOT DETECTED
Vibrio cholerae: NOT DETECTED
Vibrio species: NOT DETECTED
Yersinia enterocolitica: NOT DETECTED

## 2015-10-05 NOTE — Telephone Encounter (Signed)
Call rcvd from [facility] for pt seen in [facility] ED.  Stool (+) for [place]inda [place] Virus.  Result reported to caregiver who saw pt while in ED S. Barrett PA .  Call pt and inform and stress good hygiene precautions.  Pt informed of dx and need for good hygiene.

## 2015-10-07 ENCOUNTER — Other Ambulatory Visit (INDEPENDENT_AMBULATORY_CARE_PROVIDER_SITE_OTHER): Payer: BLUE CROSS/BLUE SHIELD | Admitting: *Deleted

## 2015-10-07 ENCOUNTER — Ambulatory Visit (INDEPENDENT_AMBULATORY_CARE_PROVIDER_SITE_OTHER): Payer: BLUE CROSS/BLUE SHIELD | Admitting: Internal Medicine

## 2015-10-07 ENCOUNTER — Encounter: Payer: Self-pay | Admitting: Internal Medicine

## 2015-10-07 VITALS — BP 130/68 | HR 78 | Temp 97.9°F | Resp 12 | Wt 252.8 lb

## 2015-10-07 DIAGNOSIS — Z794 Long term (current) use of insulin: Secondary | ICD-10-CM | POA: Diagnosis not present

## 2015-10-07 DIAGNOSIS — E1140 Type 2 diabetes mellitus with diabetic neuropathy, unspecified: Secondary | ICD-10-CM

## 2015-10-07 DIAGNOSIS — E1142 Type 2 diabetes mellitus with diabetic polyneuropathy: Secondary | ICD-10-CM | POA: Diagnosis not present

## 2015-10-07 DIAGNOSIS — IMO0002 Reserved for concepts with insufficient information to code with codable children: Secondary | ICD-10-CM

## 2015-10-07 DIAGNOSIS — E1165 Type 2 diabetes mellitus with hyperglycemia: Secondary | ICD-10-CM | POA: Diagnosis not present

## 2015-10-07 LAB — POCT GLYCOSYLATED HEMOGLOBIN (HGB A1C): Hemoglobin A1C: 7.4

## 2015-10-07 NOTE — Patient Instructions (Signed)
Please continue: - Metformin 1000 mg 2x a day. - Toujeo 40 units at bedtime - NovoLog 15 min before every meal: - 10 units with a small meal - 14 units with a regular meal - 18 units with a large meal  [- 5 units with a snack (after dinner)]  Please return in 3 months with your sugar log.

## 2015-10-07 NOTE — Progress Notes (Signed)
Patient ID: [name], female   DOB: [birth year], 54 y.o.   MRN: [id number]  HPI: [name] is a 54 y.o.-year-old female, returning for f/u DM2, dx 2007, insulin-dependent, uncontrolled, with complications (CHF stage 2, peripheral neuropathy). Last visit 11 mo ago.  She had a foot fracture 11/2013 >> screws placed >> infected: MRSA >> cleaned 2x (last by Dr Sharol Given). MRI last week: no more infections.   She had a gastroenteritis last weekend.   Last hemoglobin A1c was: Lab Results  Component Value Date   HGBA1C 8.2* 04/28/2015   HGBA1C 8.2* 04/27/2015   HGBA1C 9.2* 09/16/2014  She is on Prednisone 10 >> 40 >> 7 mg for sarcoidosis.   [name] on a regimen of: - Metformin 1000 mg 2x a day.  - Toujeo 40 units at bedtime  - NovoLog 15 min before every meal:  - 10 units with a small meal  - 14 units with a regular meal  - 18 units with a large meal  [- 5 units with a snack (after dinner)]  She stopped Invokana 300 mg daily - added 01/2014 - had 2 yeast infections (stopped 06/2014) We stopped Glipizide XL 10 mg. We stopped Glyburide 2.5 mg  Pt checks her sugars 3x a day and they are - higher when she had the foot infection: - am: 115-258 >> 200-210 >> 135-225, 266 >> 130-169, 180 >> 100-125 - 2h after b'fast: n/c >> 200, 315 >> n/c - before lunch: n/c >> n/c >> n/c >> 190s >> n/c >> 130-180 >> 130s - 2h after lunch: n/c >> n/c >> 414 >> n/c - before dinner: n/c >> 230 >> n/c >> 190s >> 268 >> 140-160 >> 130s - 2h after dinner: n/c >> 245 >> 229, 247 >> n/c - bedtime: 200s >> 241-462 (mostly 200s) >> 199-395 >> 190s >> 240-342 >> 150-329 >> 150s - nighttime: n/c No lows. Lowest sugar was 130 >> 100; she has hypoglycemia awareness at 70s.  Highest sugar was 500s >> 300s >> 184.  Meter: Freestyle Freedom Lite. (last night)   She had a nutrition class >> helped.   Pt's meals are: - Breakfast: egg + bacon or if go out: bisquit - Lunch: sandwich - Dinner: meat + veggies +  starch - Snacks: pork skins and cookies - 1-2 a day; also after dinner No exercise.  - no CKD, last BUN/creatinine:  Lab Results  Component Value Date   BUN 17 10/04/2015   CREATININE 1.09* 10/04/2015  On Losartan 100 mg >> 50 mg. - last set of lipids: Lab Results  Component Value Date   CHOL 211* 12/10/2013   HDL 44.80 12/10/2013   LDLCALC UNABLE TO CALCULATE IF TRIGLYCERIDE OVER 400 mg/dL 05/28/2011   LDLDIRECT 117.9 12/10/2013   TRIG 508.0* 12/10/2013   CHOLHDL 5 12/10/2013  On Lipitor. - last eye exam was in 07/17/2015- sees eye dr once a year. No DR. Had cataract sx (left) and will have the R cataract sx soon. - + numbness and tingling in her feet and legs hurt. She has restless legs. We started Neurontin 100 mg hs >> works well.  She also has gout, HL, HTN, sarcoidosis of lung, GERD, COPD.  I reviewed pt's medications, allergies, PMH, social hx, family hx, and changes were documented in the history of present illness. Otherwise, unchanged from my initial visit note.  ROS: Constitutional: no weight gain/loss, no fatigue, + hot flushes, + nocturia x1 Eyes: no blurry vision, no  xerophthalmia ENT: no sore throat, no nodules palpated in throat, no dysphagia/odynophagia, no hoarseness Cardiovascular: no CP/SOB/palpitations/periankle swelling Respiratory: no cough/no SOB Gastrointestinal: no N/V/D/C/heartburn Musculoskeletal: no muscle/joint aches Skin: no rashes Neurological: no tremors/tingling/dizziness  PE: BP 130/68 mmHg  Pulse 78  Temp(Src) 97.9 F (36.6 C) (Oral)  Resp 12  Wt 252 lb 12.8 oz (114.669 kg)  SpO2 97% Body mass index is 40.82 kg/(m^2). Wt Readings from Last 3 Encounters:  10/07/15 252 lb 12.8 oz (114.669 kg)  10/04/15 250 lb (113.399 kg)  09/25/15 255 lb 3.2 oz (115.758 kg)   Constitutional: obese, in NAD Eyes: PERRLA, EOMI, no exophthalmos ENT: moist mucous membranes, no thyromegaly, no cervical lymphadenopathy Cardiovascular: tachycardia, RR,  No MRG Respiratory: CTA B Gastrointestinal: abdomen soft, NT, ND, BS+ Musculoskeletal: no deformities, strength intact in all 4 Skin: moist, warm, no rashes Neurological: no tremor with outstretched hands  ASSESSMENT: 1. DM2, insulin-dependent, uncontrolled, with complications - CHF stage 2 - peripheral neuropathy  2. Diabetic peripheral neuropathy - on neurontin 100 >> works well  PLAN:  1. Patient with long-standing, uncontrolled diabetes, on oral antidiabetic regimen + Basal insulin +  mealtime insulin. Sugars better! We checked a HbA1c today >> 7.45 (lower!). Will continue current regimen. - I suggested to:  Patient Instructions  Please continue: - Metformin 1000 mg 2x a day Continue Lantus 35 units at bedtime Increase NovoLog 15 min before every meal: - 8 units with a small meal - 12 units with a regular meal - 16 units with a large meal  Please return in 1.5 month with your sugar log.  - she knows she may need to double the dose of NovoLog if on Prednisone again  - continue checking sugars at different times of the day - check 3 times a day, rotating checks! - she is up to date with yearly eye exams - needs a Lipid panel, but now after lunch >> advised to schedule next appt in am and come fasting if early appt - Return to clinic in 3 mo with sugar log   2. Diabetic PN - continue Neurontin 100 mg hs  - no SE

## 2015-10-08 ENCOUNTER — Telehealth: Payer: Self-pay | Admitting: Cardiology

## 2015-10-08 MED ORDER — CARVEDILOL 25 MG PO TABS: 25.0000 mg | ORAL_TABLET | Freq: Two times a day (BID) | ORAL | Status: DC

## 2015-10-08 NOTE — Telephone Encounter (Signed)
Rx(s) sent to pharmacy electronically.  

## 2015-10-08 NOTE — Telephone Encounter (Signed)
°*  STAT* If patient is at the pharmacy, call can be transferred to refill team.   1. Which medications need to be refilled? (please list name of each medication and dose if known) Carvedilol 25 mg   2. Which pharmacy/location (including street and city if local pharmacy) is medication to be sent to? [name] on [address]  3. Do they need a 30 day or 90 day supply? [place]

## 2015-10-10 ENCOUNTER — Ambulatory Visit (INDEPENDENT_AMBULATORY_CARE_PROVIDER_SITE_OTHER): Payer: BLUE CROSS/BLUE SHIELD | Admitting: Family Medicine

## 2015-10-10 ENCOUNTER — Encounter: Payer: Self-pay | Admitting: Family Medicine

## 2015-10-10 VITALS — BP 118/74 | HR 86 | Temp 97.8°F | Ht 66.0 in | Wt 252.9 lb

## 2015-10-10 DIAGNOSIS — J069 Acute upper respiratory infection, unspecified: Secondary | ICD-10-CM | POA: Diagnosis not present

## 2015-10-10 MED ORDER — HYDROCOD POLST-CPM POLST ER 10-8 MG/5ML PO SUER: 5.0000 mL | Freq: Two times a day (BID) | ORAL | Status: DC | PRN

## 2015-10-10 NOTE — Progress Notes (Signed)
HPI:  URI: -started: 3-4 days ago -symptoms:clear nasal congestion, sore throat, PND, cough -denies:fever, SOB, NVD, tooth pain, thick sputum -has tried: nothing - used her alb once a few days ago -sick contacts/travel/risks: denies flu exposure, tick exposure or or Ebola risks -Hx of: allergies, GERD, pulm sarcoid - sees pulmonology take dulera and alb prn along with daily low dose prednisone  ROS: See pertinent positives and negatives per HPI.  Past Medical History  Diagnosis Date  . DIABETES MELLITUS, TYPE II 08/21/2006  . HYPERLIPIDEMIA 08/21/2006  . GOUT 08/20/2010  . OBESITY 06/04/2009  . ANEMIA-UNSPECIFIED 09/18/2009  . HYPERTENSION 08/21/2006  . GERD 08/21/2006  . Internal hemorrhoids   . Pulmonary sarcoidosis ([place])     Followed locally by pulmonology, but also by Dr. Casper Harrison at [facility]  . Exertional chest pain     sharp, substernal, exertional  . Vocal cord dysfunction   . Chronic diastolic heart failure, NYHA class 2 (HCC)     LVEDP roughly 20% by cath  . CHF (congestive heart failure) ([place])   . COPD (chronic obstructive pulmonary disease) ([place])   . Depression   . Fracture of 5th metatarsal     non union  . Abnormal SPEP 04/17/2014  . Osteomyelitis of left foot ([place]) 05/29/2015  . Diabetic osteomyelitis ([place]) 05/29/2015  . Hx of umbilical hernia repair   . Infection of wound due to methicillin resistant Staphylococcus aureus (MRSA)   . Wears partial dentures     Past Surgical History  Procedure Laterality Date  . Ventral hernia repair    . Nissen fundoplication  [id number]  . Cholecystectomy  1984  . Abdominal hysterectomy    . Knee arthroscopy      right  . Tubal ligation      with reversal in 1994  . Bladder suspension  11/11/2011    Procedure: TRANSVAGINAL TAPE (TVT) PROCEDURE;  Surgeon: Olga Millers, MD;  Location: [facility];  Service: Gynecology;  Laterality: N/A;  . Cystoscopy  11/11/2011    Procedure: CYSTOSCOPY;  Surgeon: Olga Millers,  MD;  Location: [facility];  Service: Gynecology;  Laterality: N/A;  . Doppler echocardiography  02/12/2013    LV FUNCTION, SIZE NORMAL; MILD CONCENTRIC LVH; EST EF 55-65%; WALL MOTION NORMAL  . Lexiscan myoview  03/09/2013    EF 50%; NORMAL MYOCARDIAL PERFUSION STUDY - breast attenuation  . Cardiac catheterization  07/2010    LVEF 50-55% WITH VERY MILD GLOBAL HYPOKINESIA; ESSENTIALLY NORMAL CORONARY ARTERIES; NORMAL LV FUNCTION  . Carotids  02/18/11    CAROTID DUPLEX; VERTEBRALS ARE PATENT WITH ANTEGRADE FLOW. ICA/CCA RATIO 1.61 ON RIGHT AND 0.75 ON LEFT  . Right and left cardiac catheterization  04/23/2013    Angiographic normal coronaries; LVEDP 20 mmHg, PCWP 12-14 mmHg, RAP 12 mmHg.; Fick CO/CI 4.9/2.2  . Metatarsal osteotomy with open reduction internal fixation (orif) metatarsal with fusion Left 04/09/2014    Procedure: LEFT FOOT FRACTURE OPEN TREATMENT METATARSAL INCLUDES INTERNAL FIXATION EACH;  Surgeon: Lorn Junes, MD;  Location: [place];  Service: Orthopedics;  Laterality: Left;  . Left and right heart catheterization with coronary angiogram N/A 04/23/2013    Procedure: LEFT AND RIGHT HEART CATHETERIZATION WITH CORONARY ANGIOGRAM;  Surgeon: Leonie Man, MD;  Location: [facility] CATH LAB;  Service: Cardiovascular;  Laterality: N/A;  . Hernia repair    . Appendectomy    . Fracture surgery    . I&d extremity Left 06/27/2015    Procedure: Partial  Excision Left Calcaneus, Place Antibiotic Beads, and Wound VAC;  Surgeon: Newt Minion, MD;  Location: [place];  Service: Orthopedics;  Laterality: Left;    Family History  Problem Relation Age of Onset  . Diabetes Father   . Heart attack Father   . Coronary artery disease Father   . Heart failure Father   . COPD Mother   . Emphysema Mother   . Asthma Mother   . Heart failure Mother   . Sarcoidosis Maternal Uncle   . Colon cancer Neg Hx   . Lung cancer Brother   . Cancer Brother   . Diabetes Brother   . Heart attack Maternal  Grandfather     Social History   Social History  . Marital Status: Married    Spouse Name: TAUNIA HENEHAN  . Number of Children: 2  . Years of Education: 12   Occupational History  . DISABLED    Social History Main Topics  . Smoking status: Never Smoker   . Smokeless tobacco: Never Used  . Alcohol Use: No  . Drug Use: No  . Sexual Activity: Yes    Birth Control/ Protection: Surgical   Other Topics Concern  . None   Social History Narrative   Married 1994. 2 sons who both live close and 1 grandson.       Disability due to sarcoidosis. 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  acetaminophen (TYLENOL) 325 MG tablet, Take 650 mg by mouth every 6 (six) hours as needed for headache., Disp: , Rfl:  .  albuterol (PROVENTIL HFA;VENTOLIN HFA) 108 (90 BASE) MCG/ACT inhaler, Inhale 1-2 puffs into the lungs every 6 (six) hours as needed for wheezing or shortness of breath., Disp: 8 g, Rfl: 5 .  allopurinol (ZYLOPRIM) 100 MG tablet, Take 100 mg by mouth at bedtime. , Disp: , Rfl:  .  aspirin EC 81 MG tablet, Take 81 mg by mouth daily. , Disp: , Rfl:  .  atorvastatin (LIPITOR) 40 MG tablet, TAKE ONE TABLET BY MOUTH ONE TIME DAILY, Disp: 90 tablet, Rfl: 3 .  B-D ULTRAFINE III SHORT PEN 31G X 8 MM MISC, FOLLOW DIRECTIONS PROVIDED BY PHYSICIAN, Disp: 130 each, Rfl: 2 .  buPROPion (WELLBUTRIN XL) 300 MG 24 hr tablet, Take 1 tablet (300 mg total) by mouth daily., Disp: 90 tablet, Rfl: 3 .  CARTIA XT 120 MG 24 hr capsule, TAKE ONE CAPSULE BY MOUTH ONE TIME DAILY IN THE EVENING , Disp: 30 capsule, Rfl: 11 .  carvedilol (COREG) 25 MG tablet, Take 1 tablet (25 mg total) by mouth 2 (two) times daily with a meal. MUST KEEP APPOINTMENT 11/20/2015 WITH DR HARDING FOR FUTURE REFILLS, Disp: 60 tablet, Rfl: 1 .   chlorpheniramine-HYDROcodone (TUSSIONEX) 10-8 MG/5ML SUER, Take 5 mLs by mouth every 12 (twelve) hours as needed for cough., Disp: 120 mL, Rfl: 0 .  cholecalciferol 5000 UNITS TABS, Take 1 tablet (5,000 Units total) by mouth daily. (Patient taking differently: Take 5,000 Units by mouth every morning. ), Disp: 30 tablet, Rfl: 3 .  DEXILANT 60 MG capsule, TAKE ONE CAPSULE BY MOUTH ONE TIME DAILY, Disp: 30 capsule, Rfl: 6 .  diphenhydrAMINE (BENADRYL) 25 mg capsule, Take 1 capsule (25 mg total) by mouth every 6 (six) hours as needed for itching.,  Disp: 15 capsule, Rfl: 0 .  estradiol (ESTRACE) 2 MG tablet, Take 2 mg by mouth at bedtime. , Disp: , Rfl:  .  fenofibrate (TRICOR) 145 MG tablet, TAKE ONE TABLET BY MOUTH ONE TIME DAILY  (Patient taking differently: TAKE ONE TABLET BY MOUTH every evening), Disp: 30 tablet, Rfl: 10 .  fluticasone (FLONASE) 50 MCG/ACT nasal spray, USE TWO SPRAYS IN EACH NOSTRIL DAILY, Disp: 16 g, Rfl: 4 .  furosemide (LASIX) 40 MG tablet, Take 1 tablet (40 mg total) by mouth 2 (two) times daily., Disp: 60 tablet, Rfl: 4 .  furosemide (LASIX) 40 MG tablet, Take 1 tablet (40 mg total) by mouth 2 (two) times daily. PATIENT NEEDS TO CONTACT OFFICE FOR ADDITIONAL REFILLS, Disp: 30 tablet, Rfl: 0 .  gabapentin (NEURONTIN) 100 MG capsule, Take 1 capsule (100 mg total) by mouth 3 (three) times daily. **PT NEEDS AN APPT FOR FURTHER REFILLS**, Disp: 90 capsule, Rfl: 1 .  glucose blood (ONE TOUCH ULTRA TEST) test strip, 1 each by Other route 3 (three) times daily. Use as instructed, Disp: 100 each, Rfl: 5 .  HYDROcodone-acetaminophen (NORCO) 10-325 MG per tablet, Take 1-2 tablets by mouth every 4 (four) hours as needed for moderate pain. , Disp: , Rfl: 0 .  Insulin Glargine (TOUJEO SOLOSTAR) 300 UNIT/ML SOPN, Inject 40 Units into the skin at bedtime., Disp: 3 pen, Rfl: 2 .  Insulin Pen Needle 31G X 8 MM MISC, Use to inject insulin 4 times daily., Disp: 130 each, Rfl: 2 .  losartan (COZAAR)  50 MG tablet, Take 1 tablet (50 mg total) by mouth daily. (Patient taking differently: Take 100 mg by mouth daily. ), Disp: 90 tablet, Rfl: 3 .  metFORMIN (GLUCOPHAGE) 1000 MG tablet, TAKE ONE TABLET BY MOUTH TWICE DAILY with a meal., Disp: 180 tablet, Rfl: 2 .  mometasone-formoterol (DULERA) 200-5 MCG/ACT AERO, Inhale 2 puffs into the lungs 2 (two) times daily., Disp: 13 g, Rfl: 5 .  nitroGLYCERIN (NITROSTAT) 0.4 MG SL tablet, Place 0.4 mg under the tongue every 5 (five) minutes as needed. x3 doses as needed for chest pain., Disp: , Rfl:  .  NOVOLOG FLEXPEN 100 UNIT/ML FlexPen, INJECT 7-12 UNITS INTO THE SKIN 3 TIMES DAILY WITH MEALS., Disp: 15 pen, Rfl: 1 .  ondansetron (ZOFRAN-ODT) 4 MG disintegrating tablet, Take 4 mg by mouth every 8 (eight) hours as needed for nausea or vomiting. , Disp: , Rfl: 0 .  PARoxetine Mesylate (BRISDELLE) 7.5 MG CAPS, Take 7.5 mg by mouth daily., Disp: , Rfl:  .  potassium chloride SA (K-DUR,KLOR-CON) 20 MEQ tablet, Take 1 tablet (20 mEq total) by mouth 3 (three) times daily., Disp: 90 tablet, Rfl: 1 .  promethazine (PHENERGAN) 25 MG tablet, Take 1 tablet (25 mg total) by mouth every 6 (six) hours as needed for nausea or vomiting., Disp: 15 tablet, Rfl: 0 .  venlafaxine XR (EFFEXOR-XR) 75 MG 24 hr capsule, Take 1 capsule (75 mg total) by mouth 2 (two) times daily., Disp: 180 capsule, Rfl: 3 .  vitamin B-12 (CYANOCOBALAMIN) 1000 MCG tablet, Take 1,000 mcg by mouth daily., Disp: , Rfl:   EXAM:  Filed Vitals:   10/10/15 1348  BP: 118/74  Pulse: 86  Temp: 97.8 F (36.6 C)    Body mass index is 40.84 kg/(m^2).  GENERAL: vitals reviewed and listed above, alert, oriented, appears well hydrated and in no acute distress  HEENT: atraumatic, conjunttiva clear, no obvious abnormalities on inspection of external nose and ears, normal  appearance of ear canals and TMs, clear nasal congestion, mild post oropharyngeal erythema with PND, no tonsillar edema or exudate, no sinus  TTP  NECK: no obvious masses on inspection  LUNGS: clear to auscultation bilaterally, no wheezes, rales or rhonchi, good air movement  CV: HRRR, no peripheral edema  MS: moves all extremities without noticeable abnormality  PSYCH: pleasant and cooperative, no obvious depression or anxiety  ASSESSMENT AND PLAN:  Discussed the following assessment and plan:  Viral upper respiratory infection  -given HPI and exam findings today, a serious infection or illness is unlikely. We discussed potential etiologies, with VURI being most likely, and advised supportive care and monitoring. We discussed treatment side effects, likely course, antibiotic misuse, transmission, and signs of developing a serious illness. Given her underlying health conditions we advised a low threshhold for reevaluation if worsening or new symptoms or not improving as expected and advise dher of various ucc and emergency options available over the long weekend. She requested a refill of her tussionex which we provided. -of course, we advised to return or notify a doctor immediately if symptoms worsen or persist or new concerns arise.    Patient Instructions  INSTRUCTIONS FOR UPPER RESPIRATORY INFECTION:  -plenty of rest and fluids  -nasal saline wash 2-3 times daily (use prepackaged nasal saline or bottled/distilled water if making your own)   -can use AFRIN nasal spray for drainage and nasal congestion - but do NOT use longer then 3-4 days  -can use tylenol (in no history of liver disease) or ibuprofen (if no history of kidney disease, bowel bleeding or significant heart disease) as directed for aches and sorethroat  -in the winter time, using a humidifier at night is helpful (please follow cleaning instructions)  -if you are taking a cough medication - use only as directed, may also try a teaspoon of honey to coat the throat and throat lozenges. If given a cough medication with codeine or hydrocodone or other  narcotic please be advised that this contains a strong and  potentially addicting medication. Please follow instructions carefully, take as little as possible and only use AS NEEDED for severe cough. Discuss potential side effects with your pharmacy. Please do not drive or operate machinery while taking these types of medications. Please do not take other sedating medications, drugs or alcohol while taking this medication without discussing with your doctor.  -for sore throat, salt water gargles can help  -follow up if you have fevers, facial pain, tooth pain, difficulty breathing or are worsening or symptoms persist longer then expected  Upper Respiratory Infection, Adult An upper respiratory infection (URI) is also known as the common cold. It is often caused by a type of germ (virus). Colds are easily spread (contagious). You can pass it to others by kissing, coughing, sneezing, or drinking out of the same glass. Usually, you get better in 1 to 3  weeks.  However, the cough can last for even longer. HOME CARE   Only take medicine as told by your doctor. Follow instructions provided above.  Drink enough water and fluids to keep your pee (urine) clear or pale yellow.  Get plenty of rest.  Return to work when your temperature is < 100 for 24 hours or as told by your doctor. You may use a face mask and wash your hands to stop your cold from spreading. GET HELP RIGHT AWAY IF:   After the first few days, you feel you are getting worse.  You have questions  about your medicine.  You have chills, shortness of breath, or red spit (mucus).  You have pain in the face for more then 1-2 days, especially when you bend forward.  You have a fever, puffy (swollen) neck, pain when you swallow, or white spots in the back of your throat.  You have a bad headache, ear pain, sinus pain, or chest pain.  You have a high-pitched whistling sound when you breathe in and out (wheezing).  You cough up  blood.  You have sore muscles or a stiff neck. MAKE SURE YOU:   Understand these instructions.  Will watch your condition.  Will get help right away if you are not doing well or get worse. Document Released: 03/15/2008 Document Revised: 12/20/2011 Document Reviewed: 01/02/2014 [facility] Patient Information 2015 [place], [place]. This information is not intended to replace advice given to you by your health care provider. Make sure you discuss any questions you have with your health care provider.      Colin Benton R.

## 2015-10-10 NOTE — Patient Instructions (Signed)
INSTRUCTIONS FOR UPPER RESPIRATORY INFECTION:  -plenty of rest and fluids  -nasal saline wash 2-3 times daily (use prepackaged nasal saline or bottled/distilled water if making your own)   -can use AFRIN nasal spray for drainage and nasal congestion - but do NOT use longer then 3-4 days  -can use tylenol (in no history of liver disease) or ibuprofen (if no history of kidney disease, bowel bleeding or significant heart disease) as directed for aches and sorethroat  -in the winter time, using a humidifier at night is helpful (please follow cleaning instructions)  -if you are taking a cough medication - use only as directed, may also try a teaspoon of honey to coat the throat and throat lozenges. If given a cough medication with codeine or hydrocodone or other narcotic please be advised that this contains a strong and  potentially addicting medication. Please follow instructions carefully, take as little as possible and only use AS NEEDED for severe cough. Discuss potential side effects with your pharmacy. Please do not drive or operate machinery while taking these types of medications. Please do not take other sedating medications, drugs or alcohol while taking this medication without discussing with your doctor.  -for sore throat, salt water gargles can help  -follow up if you have fevers, facial pain, tooth pain, difficulty breathing or are worsening or symptoms persist longer then expected  Upper Respiratory Infection, Adult An upper respiratory infection (URI) is also known as the common cold. It is often caused by a type of germ (virus). Colds are easily spread (contagious). You can pass it to others by kissing, coughing, sneezing, or drinking out of the same glass. Usually, you get better in 1 to 3  weeks.  However, the cough can last for even longer. HOME CARE   Only take medicine as told by your doctor. Follow instructions provided above.  Drink enough water and fluids to keep your pee  (urine) clear or pale yellow.  Get plenty of rest.  Return to work when your temperature is < 100 for 24 hours or as told by your doctor. You may use a face mask and wash your hands to stop your cold from spreading. GET HELP RIGHT AWAY IF:   After the first few days, you feel you are getting worse.  You have questions about your medicine.  You have chills, shortness of breath, or red spit (mucus).  You have pain in the face for more then 1-2 days, especially when you bend forward.  You have a fever, puffy (swollen) neck, pain when you swallow, or white spots in the back of your throat.  You have a bad headache, ear pain, sinus pain, or chest pain.  You have a high-pitched whistling sound when you breathe in and out (wheezing).  You cough up blood.  You have sore muscles or a stiff neck. MAKE SURE YOU:   Understand these instructions.  Will watch your condition.  Will get help right away if you are not doing well or get worse. Document Released: 03/15/2008 Document Revised: 12/20/2011 Document Reviewed: 01/02/2014 [employer] Patient Information 2015 [employer]. This information is not intended to replace advice given to you by your health care provider. Make sure you discuss any questions you have with your health care provider.  

## 2015-10-10 NOTE — Progress Notes (Signed)
Pre visit review using our clinic review tool, if applicable. No additional management support is needed unless otherwise documented below in the visit note. 

## 2015-10-13 ENCOUNTER — Encounter (HOSPITAL_COMMUNITY): Payer: Self-pay | Admitting: *Deleted

## 2015-10-13 ENCOUNTER — Inpatient Hospital Stay (HOSPITAL_COMMUNITY)
Admission: EM | Admit: 2015-10-13 | Discharge: 2015-10-18 | DRG: 191 | Disposition: A | Discharge status: Home / Self Care | Payer: BLUE CROSS/BLUE SHIELD | Attending: Internal Medicine | Admitting: Internal Medicine

## 2015-10-13 ENCOUNTER — Emergency Department (HOSPITAL_COMMUNITY): Payer: BLUE CROSS/BLUE SHIELD

## 2015-10-13 DIAGNOSIS — E1122 Type 2 diabetes mellitus with diabetic chronic kidney disease: Secondary | ICD-10-CM | POA: Diagnosis present

## 2015-10-13 DIAGNOSIS — I130 Hypertensive heart and chronic kidney disease with heart failure and stage 1 through stage 4 chronic kidney disease, or unspecified chronic kidney disease: Secondary | ICD-10-CM | POA: Diagnosis present

## 2015-10-13 DIAGNOSIS — N182 Chronic kidney disease, stage 2 (mild): Secondary | ICD-10-CM | POA: Diagnosis present

## 2015-10-13 DIAGNOSIS — D860 Sarcoidosis of lung: Secondary | ICD-10-CM | POA: Diagnosis not present

## 2015-10-13 DIAGNOSIS — I110 Hypertensive heart disease with heart failure: Secondary | ICD-10-CM | POA: Diagnosis present

## 2015-10-13 DIAGNOSIS — E1140 Type 2 diabetes mellitus with diabetic neuropathy, unspecified: Secondary | ICD-10-CM | POA: Diagnosis not present

## 2015-10-13 DIAGNOSIS — H20052 Hypopyon, left eye: Secondary | ICD-10-CM | POA: Diagnosis not present

## 2015-10-13 DIAGNOSIS — E1165 Type 2 diabetes mellitus with hyperglycemia: Secondary | ICD-10-CM | POA: Diagnosis present

## 2015-10-13 DIAGNOSIS — H2000 Unspecified acute and subacute iridocyclitis: Secondary | ICD-10-CM | POA: Diagnosis not present

## 2015-10-13 DIAGNOSIS — D649 Anemia, unspecified: Secondary | ICD-10-CM | POA: Diagnosis present

## 2015-10-13 DIAGNOSIS — E872 Acidosis: Secondary | ICD-10-CM | POA: Diagnosis present

## 2015-10-13 DIAGNOSIS — I5032 Chronic diastolic (congestive) heart failure: Secondary | ICD-10-CM | POA: Diagnosis not present

## 2015-10-13 DIAGNOSIS — I10 Essential (primary) hypertension: Secondary | ICD-10-CM | POA: Diagnosis not present

## 2015-10-13 DIAGNOSIS — E1159 Type 2 diabetes mellitus with other circulatory complications: Secondary | ICD-10-CM | POA: Diagnosis present

## 2015-10-13 DIAGNOSIS — Z825 Family history of asthma and other chronic lower respiratory diseases: Secondary | ICD-10-CM

## 2015-10-13 DIAGNOSIS — E1149 Type 2 diabetes mellitus with other diabetic neurological complication: Secondary | ICD-10-CM | POA: Diagnosis present

## 2015-10-13 DIAGNOSIS — E1129 Type 2 diabetes mellitus with other diabetic kidney complication: Secondary | ICD-10-CM | POA: Diagnosis present

## 2015-10-13 DIAGNOSIS — R0602 Shortness of breath: Secondary | ICD-10-CM | POA: Diagnosis not present

## 2015-10-13 DIAGNOSIS — E662 Morbid (severe) obesity with alveolar hypoventilation: Secondary | ICD-10-CM | POA: Diagnosis present

## 2015-10-13 DIAGNOSIS — D508 Other iron deficiency anemias: Secondary | ICD-10-CM | POA: Diagnosis not present

## 2015-10-13 DIAGNOSIS — Z794 Long term (current) use of insulin: Secondary | ICD-10-CM | POA: Diagnosis not present

## 2015-10-13 DIAGNOSIS — J441 Chronic obstructive pulmonary disease with (acute) exacerbation: Secondary | ICD-10-CM | POA: Diagnosis present

## 2015-10-13 DIAGNOSIS — Z6841 Body Mass Index (BMI) 40.0 and over, adult: Secondary | ICD-10-CM | POA: Diagnosis not present

## 2015-10-13 DIAGNOSIS — J208 Acute bronchitis due to other specified organisms: Secondary | ICD-10-CM | POA: Diagnosis present

## 2015-10-13 DIAGNOSIS — J440 Chronic obstructive pulmonary disease with acute lower respiratory infection: Principal | ICD-10-CM | POA: Diagnosis present

## 2015-10-13 DIAGNOSIS — D539 Nutritional anemia, unspecified: Secondary | ICD-10-CM | POA: Diagnosis present

## 2015-10-13 DIAGNOSIS — I152 Hypertension secondary to endocrine disorders: Secondary | ICD-10-CM | POA: Diagnosis present

## 2015-10-13 LAB — CBC
HCT: 31.5 % — ABNORMAL LOW (ref 36.0–46.0)
Hemoglobin: 10.2 g/dL — ABNORMAL LOW (ref 12.0–15.0)
MCH: 29.1 pg (ref 26.0–34.0)
MCHC: 32.4 g/dL (ref 30.0–36.0)
MCV: 89.7 fL (ref 78.0–100.0)
Platelets: 433 10*3/uL — ABNORMAL HIGH (ref 150–400)
RBC: 3.51 MIL/uL — ABNORMAL LOW (ref 3.87–5.11)
RDW: 13.7 % (ref 11.5–15.5)
WBC: 17 10*3/uL — ABNORMAL HIGH (ref 4.0–10.5)

## 2015-10-13 LAB — BASIC METABOLIC PANEL
Anion gap: 12 (ref 5–15)
BUN: 25 mg/dL — ABNORMAL HIGH (ref 6–20)
CO2: 28 mmol/L (ref 22–32)
Calcium: 9.3 mg/dL (ref 8.9–10.3)
Chloride: 98 mmol/L — ABNORMAL LOW (ref 101–111)
Creatinine, Ser: 1.08 mg/dL — ABNORMAL HIGH (ref 0.44–1.00)
GFR calc Af Amer: >60 mL/min (ref >60)
GFR calc non Af Amer: 56 mL/min — ABNORMAL LOW (ref >60)
Glucose, Bld: 287 mg/dL — ABNORMAL HIGH (ref 65–99)
Potassium: 4.1 mmol/L (ref 3.5–5.1)
Sodium: 138 mmol/L (ref 135–145)

## 2015-10-13 LAB — I-STAT TROPONIN, ED: Troponin i, poc: 0 ng/mL (ref 0.00–0.08)

## 2015-10-13 MED ORDER — LORAZEPAM 2 MG/ML IJ SOLN
1.0000 mg | Freq: Once | INTRAMUSCULAR | Status: AC
  Administered 2015-10-13: 1 mg via INTRAVENOUS
  Filled 2015-10-13: qty 1

## 2015-10-13 MED ORDER — METHYLPREDNISOLONE SODIUM SUCC 125 MG IJ SOLR
125.0000 mg | Freq: Once | INTRAMUSCULAR | Status: AC
  Administered 2015-10-13: 125 mg via INTRAVENOUS
  Filled 2015-10-13: qty 2

## 2015-10-13 MED ORDER — ALBUTEROL SULFATE (2.5 MG/3ML) 0.083% IN NEBU
5.0000 mg | INHALATION_SOLUTION | Freq: Once | RESPIRATORY_TRACT | Status: AC
Start: 2015-10-13 — End: 2015-10-13
  Administered 2015-10-13: 5 mg via RESPIRATORY_TRACT

## 2015-10-13 MED ORDER — ALBUTEROL SULFATE (2.5 MG/3ML) 0.083% IN NEBU
INHALATION_SOLUTION | RESPIRATORY_TRACT | Status: AC
  Filled 2015-10-13: qty 6

## 2015-10-13 MED ORDER — IPRATROPIUM-ALBUTEROL 0.5-2.5 (3) MG/3ML IN SOLN
3.0000 mL | Freq: Once | RESPIRATORY_TRACT | Status: AC
  Administered 2015-10-13: 3 mL via RESPIRATORY_TRACT
  Filled 2015-10-13: qty 3

## 2015-10-13 MED ORDER — HYDROCOD POLST-CPM POLST ER 10-8 MG/5ML PO SUER
5.0000 mL | Freq: Once | ORAL | Status: AC
  Administered 2015-10-13: 5 mL via ORAL
  Filled 2015-10-13: qty 5

## 2015-10-13 MED ORDER — SODIUM CHLORIDE 0.9 % IV BOLUS (SEPSIS)
500.0000 mL | Freq: Once | INTRAVENOUS | Status: AC
  Administered 2015-10-13: 500 mL via INTRAVENOUS

## 2015-10-13 NOTE — ED Notes (Signed)
Pt taken to xray 

## 2015-10-13 NOTE — ED Notes (Signed)
Hospitalist at the bedside 

## 2015-10-13 NOTE — ED Notes (Signed)
C/o cough since Wednesday. Saw PCP Friday who diagnosed with URI, recommended inhaler and cough syrup and let it run its course. Pt states that the cough has continued. Pt short of breath in triage.

## 2015-10-13 NOTE — ED Provider Notes (Signed)
CSN: [id number]     Arrival date & time 10/13/15  2059 History   First MD Initiated Contact with Patient 10/13/15 2123     Chief Complaint  Patient presents with  . Cough  . Shortness of Breath     (Consider location/radiation/quality/duration/timing/severity/associated sxs/prior Treatment) HPI..... Patient with known sarcoidosis and COPD presents with persistent coughing and wheezing. She is on low-dose maintenance prednisone. Symptoms have been present for approximately one week. She saw her primary care physician on Friday and was prescribed Tussionex and her inhaler. She has not gone better. Additional past medical history includes CHF, diabetes, many others.  Past Medical History  Diagnosis Date  . DIABETES MELLITUS, TYPE II 08/21/2006  . HYPERLIPIDEMIA 08/21/2006  . GOUT 08/20/2010  . OBESITY 06/04/2009  . ANEMIA-UNSPECIFIED 09/18/2009  . HYPERTENSION 08/21/2006  . GERD 08/21/2006  . Internal hemorrhoids   . Pulmonary sarcoidosis ([place])     Followed locally by pulmonology, but also by Dr. Casper Harrison at [facility]  . Exertional chest pain     sharp, substernal, exertional  . Vocal cord dysfunction   . Chronic diastolic heart failure, NYHA class 2 (HCC)     LVEDP roughly 20% by cath  . CHF (congestive heart failure) ([place])   . COPD (chronic obstructive pulmonary disease) ([place])   . Depression   . Fracture of 5th metatarsal     non union  . Abnormal SPEP 04/17/2014  . Osteomyelitis of left foot ([place]) 05/29/2015  . Diabetic osteomyelitis ([place]) 05/29/2015  . Hx of umbilical hernia repair   . Infection of wound due to methicillin resistant Staphylococcus aureus (MRSA)   . Wears partial dentures    Past Surgical History  Procedure Laterality Date  . Ventral hernia repair    . Nissen fundoplication  [id number]  . Cholecystectomy  1984  . Abdominal hysterectomy    . Knee arthroscopy      right  . Tubal ligation      with reversal in 1994  . Bladder suspension  11/11/2011     Procedure: TRANSVAGINAL TAPE (TVT) PROCEDURE;  Surgeon: Olga Millers, MD;  Location: [facility];  Service: Gynecology;  Laterality: N/A;  . Cystoscopy  11/11/2011    Procedure: CYSTOSCOPY;  Surgeon: Olga Millers, MD;  Location: [facility];  Service: Gynecology;  Laterality: N/A;  . Doppler echocardiography  02/12/2013    LV FUNCTION, SIZE NORMAL; MILD CONCENTRIC LVH; EST EF 55-65%; WALL MOTION NORMAL  . Lexiscan myoview  03/09/2013    EF 50%; NORMAL MYOCARDIAL PERFUSION STUDY - breast attenuation  . Cardiac catheterization  07/2010    LVEF 50-55% WITH VERY MILD GLOBAL HYPOKINESIA; ESSENTIALLY NORMAL CORONARY ARTERIES; NORMAL LV FUNCTION  . Carotids  02/18/11    CAROTID DUPLEX; VERTEBRALS ARE PATENT WITH ANTEGRADE FLOW. ICA/CCA RATIO 1.61 ON RIGHT AND 0.75 ON LEFT  . Right and left cardiac catheterization  04/23/2013    Angiographic normal coronaries; LVEDP 20 mmHg, PCWP 12-14 mmHg, RAP 12 mmHg.; Fick CO/CI 4.9/2.2  . Metatarsal osteotomy with open reduction internal fixation (orif) metatarsal with fusion Left 04/09/2014    Procedure: LEFT FOOT FRACTURE OPEN TREATMENT METATARSAL INCLUDES INTERNAL FIXATION EACH;  Surgeon: Lorn Junes, MD;  Location: [facility];  Service: Orthopedics;  Laterality: Left;  . Left and right heart catheterization with coronary angiogram N/A 04/23/2013    Procedure: LEFT AND RIGHT HEART CATHETERIZATION WITH CORONARY ANGIOGRAM;  Surgeon: Leonie Man, MD;  Location: [facility];  Service: Cardiovascular;  Laterality: N/A;  . Hernia repair    . Appendectomy    . Fracture surgery    . I&d extremity Left 06/27/2015    Procedure: Partial Excision Left Calcaneus, Place Antibiotic Beads, and Wound VAC;  Surgeon: Newt Minion, MD;  Location: [place];  Service: Orthopedics;  Laterality: Left;   Family History  Problem Relation Age of Onset  . Diabetes Father   . Heart attack Father   . Coronary artery disease Father   . Heart failure Father   . COPD Mother    . Emphysema Mother   . Asthma Mother   . Heart failure Mother   . Sarcoidosis Maternal Uncle   . Colon cancer Neg Hx   . Lung cancer Brother   . Cancer Brother   . Diabetes Brother   . Heart attack Maternal Grandfather    Social History  Substance Use Topics  . Smoking status: Never Smoker   . Smokeless tobacco: Never Used  . Alcohol Use: No   OB History    No data available     Review of Systems  All other systems reviewed and are negative.     Allergies  Vancomycin; Chlorhexidine; Lisinopril; Methotrexate; Clindamycin/lincomycin; Doxycycline; and Teflaro  Home Medications   Prior to Admission medications   Medication Sig Start Date End Date Taking? Authorizing Provider  acetaminophen (TYLENOL) 325 MG tablet Take 650 mg by mouth every 6 (six) hours as needed for headache.   Yes Historical Provider, MD  albuterol (PROVENTIL HFA;VENTOLIN HFA) 108 (90 BASE) MCG/ACT inhaler Inhale 1-2 puffs into the lungs every 6 (six) hours as needed for wheezing or shortness of breath. 09/25/15  Yes Praveen Mannam, MD  allopurinol (ZYLOPRIM) 100 MG tablet Take 100 mg by mouth at bedtime.    Yes Historical Provider, MD  aspirin EC 81 MG tablet Take 81 mg by mouth daily.    Yes Historical Provider, MD  atorvastatin (LIPITOR) 40 MG tablet TAKE ONE TABLET BY MOUTH ONE TIME DAILY 05/06/15  Yes [name], MD  buPROPion (WELLBUTRIN XL) 300 MG 24 hr tablet Take 1 tablet (300 mg total) by mouth daily. 10/03/15  Yes [name], MD  CARTIA XT 120 MG 24 hr capsule TAKE ONE CAPSULE BY MOUTH ONE TIME DAILY IN THE EVENING  10/03/14  Yes Leonie Man, MD  carvedilol (COREG) 25 MG tablet Take 1 tablet (25 mg total) by mouth 2 (two) times daily with a meal. MUST KEEP APPOINTMENT 11/20/2015 WITH DR Ellyn Hack FOR FUTURE REFILLS 10/08/15  Yes Leonie Man, MD  chlorpheniramine-HYDROcodone (TUSSIONEX) 10-8 MG/5ML SUER Take 5 mLs by mouth every 12 (twelve) hours as needed for cough. 10/10/15  Yes  Lucretia Kern, DO  cholecalciferol 5000 UNITS TABS Take 1 tablet (5,000 Units total) by mouth daily. Patient taking differently: Take 5,000 Units by mouth every morning.  04/09/14  Yes Kirstin Shepperson, PA-C  DEXILANT 60 MG capsule TAKE ONE CAPSULE BY MOUTH ONE TIME DAILY 03/14/15  Yes Elsie Stain, MD  diphenhydrAMINE (BENADRYL) 25 mg capsule Take 1 capsule (25 mg total) by mouth every 6 (six) hours as needed for itching. 08/12/15  Yes Truman Hayward, MD  estradiol (ESTRACE) 2 MG tablet Take 2 mg by mouth at bedtime.  01/24/14  Yes Historical Provider, MD  fenofibrate (TRICOR) 145 MG tablet TAKE ONE TABLET BY MOUTH ONE TIME DAILY  Patient taking differently: TAKE ONE TABLET BY MOUTH every evening 11/05/14  Yes Leonie Man,  MD  fluticasone (FLONASE) 50 MCG/ACT nasal spray USE TWO SPRAYS IN [facility] NOSTRIL DAILY 02/03/15  Yes [name], MD  furosemide (LASIX) 40 MG tablet Take 1 tablet (40 mg total) by mouth 2 (two) times daily. 05/01/15  Yes Thurnell Lose, MD  gabapentin (NEURONTIN) 100 MG capsule Take 1 capsule (100 mg total) by mouth 3 (three) times daily. **PT NEEDS AN APPT FOR FURTHER REFILLS** 07/21/15  Yes Philemon Kingdom, MD  HYDROcodone-acetaminophen (NORCO) 10-325 MG per tablet Take 1-2 tablets by mouth every 4 (four) hours as needed for moderate pain.  03/20/15  Yes Historical Provider, MD  insulin aspart (NOVOLOG FLEXPEN) 100 UNIT/ML FlexPen Inject 7-12 Units into the skin 3 (three) times daily with meals. Per sliding scale   Yes Historical Provider, MD  Insulin Glargine (TOUJEO SOLOSTAR) 300 UNIT/ML SOPN Inject 40 Units into the skin at bedtime. 10/24/14  Yes Philemon Kingdom, MD  losartan (COZAAR) 50 MG tablet Take 1 tablet (50 mg total) by mouth daily. Patient taking differently: Take 100 mg by mouth daily.  08/08/15  Yes [name], MD  metFORMIN (GLUCOPHAGE) 1000 MG tablet TAKE ONE TABLET BY MOUTH TWICE DAILY with a meal. 12/29/14  Yes Philemon Kingdom, MD   mometasone-formoterol (DULERA) 200-5 MCG/ACT AERO Inhale 2 puffs into the lungs 2 (two) times daily. 09/25/15  Yes Praveen Mannam, MD  nitroGLYCERIN (NITROSTAT) 0.4 MG SL tablet Place 0.4 mg under the tongue every 5 (five) minutes as needed. x3 doses as needed for chest pain.   Yes Historical Provider, MD  ondansetron (ZOFRAN-ODT) 4 MG disintegrating tablet Take 4 mg by mouth every 8 (eight) hours as needed for nausea or vomiting.  03/31/15  Yes Historical Provider, MD  PARoxetine Mesylate (BRISDELLE) 7.5 MG CAPS Take 7.5 mg by mouth daily.   Yes Historical Provider, MD  potassium chloride SA (K-DUR,KLOR-CON) 20 MEQ tablet Take 1 tablet (20 mEq total) by mouth 3 (three) times daily. 05/01/15  Yes Thurnell Lose, MD  promethazine (PHENERGAN) 25 MG tablet Take 1 tablet (25 mg total) by mouth every 6 (six) hours as needed for nausea or vomiting. 10/04/15  Yes Stevi Barrett, PA-C  venlafaxine XR (EFFEXOR-XR) 75 MG 24 hr capsule Take 1 capsule (75 mg total) by mouth 2 (two) times daily. 10/03/15  Yes [name], MD  vitamin B-12 (CYANOCOBALAMIN) 1000 MCG tablet Take 1,000 mcg by mouth daily.   Yes Historical Provider, MD  B-D ULTRAFINE III SHORT PEN 31G X 8 MM MISC FOLLOW DIRECTIONS PROVIDED BY PHYSICIAN 05/06/15   [name], MD  furosemide (LASIX) 40 MG tablet Take 1 tablet (40 mg total) by mouth 2 (two) times daily. PATIENT NEEDS TO CONTACT OFFICE FOR ADDITIONAL REFILLS Patient not taking: Reported on 10/13/2015 09/30/15   Leonie Man, MD  glucose blood (ONE TOUCH ULTRA TEST) test strip 1 each by Other route 3 (three) times daily. Use as instructed 10/19/13   Lisabeth Pick, MD  Insulin Pen Needle 31G X 8 MM MISC Use to inject insulin 4 times daily. 04/30/15   Philemon Kingdom, MD  NOVOLOG FLEXPEN 100 UNIT/ML FlexPen INJECT 7-12 UNITS INTO THE SKIN 3 TIMES DAILY WITH MEALS. Patient not taking: Reported on 10/13/2015 08/01/15   Philemon Kingdom, MD   BP 124/79 mmHg  Pulse 79  Temp(Src) 98.2  F (36.8 C) (Oral)  Resp 27  SpO2 100% Physical Exam  Constitutional: She is oriented to person, place, and time.  Pale, slightly dyspneic tachypnea  HENT:  Head: Normocephalic and atraumatic.  Eyes: Conjunctivae and EOM are normal. Pupils are equal, round, and reactive to light.  Neck: Normal range of motion. Neck supple.  Cardiovascular: Normal rate and regular rhythm.   Pulmonary/Chest: Effort normal.  Bronchial breath sounds, bilateral respiratory wheezes  Abdominal: Soft. Bowel sounds are normal.  Musculoskeletal: Normal range of motion.  Neurological: She is alert and oriented to person, place, and time.  Skin: Skin is warm and dry.  Psychiatric: She has a normal mood and affect. Her behavior is normal.  Nursing note and vitals reviewed.   ED Course  Procedures (including critical care time) Labs Review Labs Reviewed  BASIC METABOLIC PANEL - Abnormal; Notable for the following:    Chloride 98 (*)    Glucose, Bld 287 (*)    BUN 25 (*)    Creatinine, Ser 1.08 (*)    GFR calc non Af Amer 56 (*)    All other components within normal limits  CBC - Abnormal; Notable for the following:    WBC 17.0 (*)    RBC 3.51 (*)    Hemoglobin 10.2 (*)    HCT 31.5 (*)    Platelets 433 (*)    All other components within normal limits  I-STAT TROPOININ, ED    Imaging Review Dg Chest 2 View  10/13/2015  CLINICAL DATA:  Acute onset of shortness of breath and generalized chest pain. Initial encounter. EXAM: CHEST  2 VIEW COMPARISON:  Chest radiograph performed 05/07/2015 FINDINGS: The lungs are hypoexpanded. Mild bibasilar atelectasis is noted. Mild vascular crowding and vascular congestion are noted. No pleural effusion or pneumothorax is seen. The heart is borderline normal in size. No acute osseous abnormalities are seen. Scattered clips are noted about the gastroesophageal junction. IMPRESSION: Lungs hypoexpanded. Mild bibasilar atelectasis noted. Mild vascular congestion seen.  Electronically Signed   By: Garald Balding M.D.   On: 10/13/2015 21:50   I have personally reviewed and evaluated these images and lab results as part of my medical decision-making.   EKG Interpretation   Date/Time:  Monday October 13 2015 21:15:18 EST Ventricular Rate:  81 PR Interval:    QRS Duration: 76 QT Interval:  366 QTC Calculation: 425 R Axis:   63 Text Interpretation:  Accelerated Junctional rhythm Abnormal ECG Confirmed  by Jeanae Whitmill  MD, Ayasha Ellingsen ([id number]) on 10/13/2015 10:10:19 PM      MDM   Final diagnoses:  COPD exacerbation ([place])    Patient with known COPD and sarcoidosis presents with worsening cough and wheezing.  IV steroids, nebulizer treatment, Tussionex.  Admit to general medicine.    Nat Christen, MD 10/13/15 2322

## 2015-10-13 NOTE — ED Notes (Signed)
[name] in xray

## 2015-10-14 ENCOUNTER — Encounter: Payer: Self-pay | Admitting: Infectious Disease

## 2015-10-14 LAB — CBC WITH DIFFERENTIAL/PLATELET
Basophils Absolute: 0.1 K/uL (ref 0.0–0.1)
Basophils Relative: 1 %
Eosinophils Absolute: 0.1 K/uL (ref 0.0–0.7)
Eosinophils Relative: 1 %
HCT: 31.2 % — ABNORMAL LOW (ref 36.0–46.0)
Hemoglobin: 10.1 g/dL — ABNORMAL LOW (ref 12.0–15.0)
Lymphocytes Relative: 10 %
Lymphs Abs: 1.5 K/uL (ref 0.7–4.0)
MCH: 28.8 pg (ref 26.0–34.0)
MCHC: 32.4 g/dL (ref 30.0–36.0)
MCV: 88.9 fL (ref 78.0–100.0)
Monocytes Absolute: 0.2 K/uL (ref 0.1–1.0)
Monocytes Relative: 2 %
Neutro Abs: 12.9 K/uL — ABNORMAL HIGH (ref 1.7–7.7)
Neutrophils Relative %: 88 %
Platelets: 390 K/uL (ref 150–400)
RBC: 3.51 MIL/uL — ABNORMAL LOW (ref 3.87–5.11)
RDW: 13.7 % (ref 11.5–15.5)
WBC: 14.7 K/uL — ABNORMAL HIGH (ref 4.0–10.5)

## 2015-10-14 LAB — TROPONIN I
Troponin I: <0.03 ng/mL
Troponin I: <0.03 ng/mL (ref <0.031)
Troponin I: 0.04 ng/mL — ABNORMAL HIGH

## 2015-10-14 LAB — BASIC METABOLIC PANEL
Anion gap: 11 (ref 5–15)
BUN: 24 mg/dL — ABNORMAL HIGH (ref 6–20)
CO2: 26 mmol/L (ref 22–32)
Calcium: 8.6 mg/dL — ABNORMAL LOW (ref 8.9–10.3)
Chloride: 98 mmol/L — ABNORMAL LOW (ref 101–111)
Creatinine, Ser: 1.15 mg/dL — ABNORMAL HIGH (ref 0.44–1.00)
GFR calc Af Amer: >60 mL/min (ref >60)
GFR calc non Af Amer: 52 mL/min — ABNORMAL LOW (ref >60)
Glucose, Bld: 283 mg/dL — ABNORMAL HIGH (ref 65–99)
Potassium: 4.2 mmol/L (ref 3.5–5.1)
Sodium: 135 mmol/L (ref 135–145)

## 2015-10-14 LAB — GLUCOSE, CAPILLARY
Glucose-Capillary: 195 mg/dL — ABNORMAL HIGH (ref 65–99)
Glucose-Capillary: 231 mg/dL — ABNORMAL HIGH (ref 65–99)
Glucose-Capillary: 355 mg/dL — ABNORMAL HIGH (ref 65–99)
Glucose-Capillary: 393 mg/dL — ABNORMAL HIGH (ref 65–99)
Glucose-Capillary: 398 mg/dL — ABNORMAL HIGH (ref 65–99)

## 2015-10-14 LAB — EXPECTORATED SPUTUM ASSESSMENT W GRAM STAIN, RFLX TO RESP C: Special Requests: NORMAL

## 2015-10-14 LAB — LACTIC ACID, PLASMA
Lactic Acid, Venous: 2.3 mmol/L (ref 0.5–2.0)
Lactic Acid, Venous: 2 mmol/L (ref 0.5–2.0)

## 2015-10-14 LAB — C-REACTIVE PROTEIN: CRP: 2.2 mg/dL — ABNORMAL HIGH

## 2015-10-14 LAB — SEDIMENTATION RATE: Sed Rate: 54 mm/hr — ABNORMAL HIGH (ref 0–22)

## 2015-10-14 LAB — BRAIN NATRIURETIC PEPTIDE: B Natriuretic Peptide: 39.7 pg/mL (ref 0.0–100.0)

## 2015-10-14 LAB — PROCALCITONIN: Procalcitonin: 0.13 ng/mL

## 2015-10-14 MED ORDER — VITAMIN B-12 1000 MCG PO TABS
1000.0000 ug | ORAL_TABLET | Freq: Every day | ORAL | Status: DC
  Administered 2015-10-14 – 2015-10-18 (×5): 1000 ug via ORAL
  Filled 2015-10-14 (×5): qty 1

## 2015-10-14 MED ORDER — SODIUM CHLORIDE 0.9 % IJ SOLN: 3.0000 mL | INTRAMUSCULAR | Status: DC | PRN

## 2015-10-14 MED ORDER — ONDANSETRON HCL 4 MG PO TABS: 4.0000 mg | ORAL_TABLET | Freq: Four times a day (QID) | ORAL | Status: DC | PRN

## 2015-10-14 MED ORDER — GABAPENTIN 100 MG PO CAPS
100.0000 mg | ORAL_CAPSULE | Freq: Three times a day (TID) | ORAL | Status: DC
  Administered 2015-10-14 – 2015-10-18 (×14): 100 mg via ORAL
  Filled 2015-10-14 (×14): qty 1

## 2015-10-14 MED ORDER — ACETAMINOPHEN 650 MG RE SUPP: 650.0000 mg | Freq: Four times a day (QID) | RECTAL | Status: DC | PRN

## 2015-10-14 MED ORDER — PANTOPRAZOLE SODIUM 40 MG PO TBEC
40.0000 mg | DELAYED_RELEASE_TABLET | Freq: Every day | ORAL | Status: DC
  Administered 2015-10-14 – 2015-10-18 (×5): 40 mg via ORAL
  Filled 2015-10-14 (×5): qty 1

## 2015-10-14 MED ORDER — ACETAMINOPHEN 325 MG PO TABS
650.0000 mg | ORAL_TABLET | Freq: Four times a day (QID) | ORAL | Status: DC | PRN
  Administered 2015-10-14 – 2015-10-18 (×2): 650 mg via ORAL
  Filled 2015-10-14 (×2): qty 2

## 2015-10-14 MED ORDER — FLUTICASONE PROPIONATE 50 MCG/ACT NA SUSP
2.0000 | Freq: Every day | NASAL | Status: DC
  Administered 2015-10-14 – 2015-10-18 (×5): 2 via NASAL
  Filled 2015-10-14: qty 16

## 2015-10-14 MED ORDER — AZITHROMYCIN 500 MG PO TABS: 250.0000 mg | ORAL_TABLET | Freq: Every day | ORAL | Status: DC

## 2015-10-14 MED ORDER — ENSURE ENLIVE PO LIQD
237.0000 mL | Freq: Two times a day (BID) | ORAL | Status: DC
  Administered 2015-10-14 – 2015-10-18 (×9): 237 mL via ORAL

## 2015-10-14 MED ORDER — METHYLPREDNISOLONE SODIUM SUCC 40 MG IJ SOLR
40.0000 mg | Freq: Two times a day (BID) | INTRAMUSCULAR | Status: DC
  Administered 2015-10-14 – 2015-10-17 (×6): 40 mg via INTRAVENOUS
  Filled 2015-10-14 (×6): qty 1

## 2015-10-14 MED ORDER — PAROXETINE HCL 20 MG PO TABS
10.0000 mg | ORAL_TABLET | Freq: Every day | ORAL | Status: DC
  Administered 2015-10-14 – 2015-10-18 (×5): 10 mg via ORAL
  Filled 2015-10-14 (×5): qty 1

## 2015-10-14 MED ORDER — HYDROCOD POLST-CPM POLST ER 10-8 MG/5ML PO SUER: 5.0000 mL | Freq: Two times a day (BID) | ORAL | Status: DC | PRN

## 2015-10-14 MED ORDER — CARVEDILOL 25 MG PO TABS
25.0000 mg | ORAL_TABLET | Freq: Two times a day (BID) | ORAL | Status: DC
  Administered 2015-10-14 – 2015-10-18 (×7): 25 mg via ORAL
  Filled 2015-10-14 (×8): qty 1

## 2015-10-14 MED ORDER — DILTIAZEM HCL ER COATED BEADS 120 MG PO CP24
120.0000 mg | ORAL_CAPSULE | Freq: Every day | ORAL | Status: DC
  Administered 2015-10-14 – 2015-10-18 (×5): 120 mg via ORAL
  Filled 2015-10-14 (×5): qty 1

## 2015-10-14 MED ORDER — VENLAFAXINE HCL ER 75 MG PO CP24
75.0000 mg | ORAL_CAPSULE | Freq: Two times a day (BID) | ORAL | Status: DC
  Administered 2015-10-14 – 2015-10-18 (×10): 75 mg via ORAL
  Filled 2015-10-14 (×10): qty 1

## 2015-10-14 MED ORDER — BUPROPION HCL ER (XL) 300 MG PO TB24
300.0000 mg | ORAL_TABLET | Freq: Every day | ORAL | Status: DC
Start: 2015-10-14 — End: 2015-10-18
  Administered 2015-10-14 – 2015-10-18 (×5): 300 mg via ORAL
  Filled 2015-10-14 (×5): qty 1

## 2015-10-14 MED ORDER — SODIUM CHLORIDE 0.9 % IJ SOLN
3.0000 mL | Freq: Two times a day (BID) | INTRAMUSCULAR | Status: DC
  Administered 2015-10-17 (×2): 3 mL via INTRAVENOUS

## 2015-10-14 MED ORDER — IPRATROPIUM-ALBUTEROL 0.5-2.5 (3) MG/3ML IN SOLN: 3.0000 mL | RESPIRATORY_TRACT | Status: DC | PRN

## 2015-10-14 MED ORDER — POTASSIUM CHLORIDE CRYS ER 20 MEQ PO TBCR
20.0000 meq | EXTENDED_RELEASE_TABLET | Freq: Two times a day (BID) | ORAL | Status: DC
  Administered 2015-10-14 – 2015-10-18 (×10): 20 meq via ORAL
  Filled 2015-10-14 (×10): qty 1

## 2015-10-14 MED ORDER — ONDANSETRON HCL 4 MG/2ML IJ SOLN
4.0000 mg | Freq: Four times a day (QID) | INTRAMUSCULAR | Status: DC | PRN
  Administered 2015-10-15: 4 mg via INTRAVENOUS
  Filled 2015-10-14: qty 2

## 2015-10-14 MED ORDER — INSULIN GLARGINE 100 UNIT/ML ~~LOC~~ SOLN
40.0000 [IU] | Freq: Every day | SUBCUTANEOUS | Status: DC
  Administered 2015-10-14 – 2015-10-16 (×4): 40 [IU] via SUBCUTANEOUS
  Filled 2015-10-14 (×6): qty 0.4

## 2015-10-14 MED ORDER — SENNOSIDES-DOCUSATE SODIUM 8.6-50 MG PO TABS: 1.0000 | ORAL_TABLET | Freq: Every evening | ORAL | Status: DC | PRN

## 2015-10-14 MED ORDER — ASPIRIN EC 81 MG PO TBEC
81.0000 mg | DELAYED_RELEASE_TABLET | Freq: Every day | ORAL | Status: DC
  Administered 2015-10-14 – 2015-10-18 (×5): 81 mg via ORAL
  Filled 2015-10-14 (×5): qty 1

## 2015-10-14 MED ORDER — ENOXAPARIN SODIUM 60 MG/0.6ML ~~LOC~~ SOLN
55.0000 mg | Freq: Every day | SUBCUTANEOUS | Status: DC
  Administered 2015-10-14 – 2015-10-17 (×5): 55 mg via SUBCUTANEOUS
  Filled 2015-10-14 (×5): qty 0.6

## 2015-10-14 MED ORDER — FLEET ENEMA 7-19 GM/118ML RE ENEM: 1.0000 | ENEMA | Freq: Once | RECTAL | Status: DC | PRN

## 2015-10-14 MED ORDER — DIFLUPREDNATE 0.05 % OP EMUL
1.0000 [drp] | OPHTHALMIC | Status: DC
  Filled 2015-10-14: qty 5

## 2015-10-14 MED ORDER — INSULIN ASPART 100 UNIT/ML ~~LOC~~ SOLN
0.0000 [IU] | Freq: Three times a day (TID) | SUBCUTANEOUS | Status: DC
  Administered 2015-10-14: 7 [IU] via SUBCUTANEOUS
  Administered 2015-10-14 (×2): 20 [IU] via SUBCUTANEOUS
  Administered 2015-10-15: 11 [IU] via SUBCUTANEOUS
  Administered 2015-10-15: 20 [IU] via SUBCUTANEOUS
  Administered 2015-10-15: 4 [IU] via SUBCUTANEOUS
  Administered 2015-10-16: 15 [IU] via SUBCUTANEOUS
  Administered 2015-10-16: 4 [IU] via SUBCUTANEOUS
  Administered 2015-10-16: 15 [IU] via SUBCUTANEOUS
  Administered 2015-10-17: 7 [IU] via SUBCUTANEOUS
  Administered 2015-10-17 (×2): 15 [IU] via SUBCUTANEOUS
  Administered 2015-10-18: 7 [IU] via SUBCUTANEOUS

## 2015-10-14 MED ORDER — HYDROCODONE-ACETAMINOPHEN 10-325 MG PO TABS
1.0000 | ORAL_TABLET | ORAL | Status: DC | PRN
  Administered 2015-10-15 – 2015-10-16 (×2): 2 via ORAL
  Filled 2015-10-14 (×3): qty 2

## 2015-10-14 MED ORDER — SODIUM CHLORIDE 0.9 % IJ SOLN
3.0000 mL | Freq: Two times a day (BID) | INTRAMUSCULAR | Status: DC
  Administered 2015-10-14 – 2015-10-17 (×7): 3 mL via INTRAVENOUS

## 2015-10-14 MED ORDER — FENOFIBRATE 160 MG PO TABS
160.0000 mg | ORAL_TABLET | Freq: Every day | ORAL | Status: DC
  Administered 2015-10-14 – 2015-10-18 (×5): 160 mg via ORAL
  Filled 2015-10-14 (×5): qty 1

## 2015-10-14 MED ORDER — AZITHROMYCIN 500 MG PO TABS
500.0000 mg | ORAL_TABLET | Freq: Every day | ORAL | Status: AC
  Administered 2015-10-15: 500 mg via ORAL
  Filled 2015-10-14: qty 1

## 2015-10-14 MED ORDER — NON FORMULARY: 1.0000 [drp] | Status: DC

## 2015-10-14 MED ORDER — DIFLUPREDNATE 0.05 % OP EMUL
1.0000 [drp] | OPHTHALMIC | Status: DC
  Administered 2015-10-14 – 2015-10-17 (×23): 1 [drp] via OPHTHALMIC
  Filled 2015-10-14: qty 5

## 2015-10-14 MED ORDER — ESTRADIOL 2 MG PO TABS
2.0000 mg | ORAL_TABLET | Freq: Every day | ORAL | Status: DC
  Administered 2015-10-14 – 2015-10-17 (×5): 2 mg via ORAL
  Filled 2015-10-14 (×7): qty 1

## 2015-10-14 MED ORDER — MORPHINE SULFATE (PF) 2 MG/ML IV SOLN
2.0000 mg | INTRAVENOUS | Status: DC | PRN
  Administered 2015-10-15: 2 mg via INTRAVENOUS
  Filled 2015-10-14: qty 1

## 2015-10-14 MED ORDER — SODIUM CHLORIDE 0.9 % IV SOLN: 250.0000 mL | INTRAVENOUS | Status: DC | PRN

## 2015-10-14 MED ORDER — ATORVASTATIN CALCIUM 40 MG PO TABS
40.0000 mg | ORAL_TABLET | Freq: Every day | ORAL | Status: DC
  Administered 2015-10-14 – 2015-10-17 (×5): 40 mg via ORAL
  Filled 2015-10-14 (×5): qty 1

## 2015-10-14 MED ORDER — LOSARTAN POTASSIUM 50 MG PO TABS
100.0000 mg | ORAL_TABLET | Freq: Every day | ORAL | Status: DC
  Administered 2015-10-14 – 2015-10-18 (×5): 100 mg via ORAL
  Filled 2015-10-14 (×5): qty 2

## 2015-10-14 MED ORDER — SODIUM CHLORIDE 0.9 % IV SOLN
INTRAVENOUS | Status: DC
  Administered 2015-10-14 – 2015-10-15 (×2): via INTRAVENOUS

## 2015-10-14 MED ORDER — MOMETASONE FURO-FORMOTEROL FUM 200-5 MCG/ACT IN AERO
2.0000 | INHALATION_SPRAY | Freq: Two times a day (BID) | RESPIRATORY_TRACT | Status: DC
  Administered 2015-10-14 – 2015-10-18 (×9): 2 via RESPIRATORY_TRACT
  Filled 2015-10-14: qty 8.8

## 2015-10-14 MED ORDER — IPRATROPIUM-ALBUTEROL 0.5-2.5 (3) MG/3ML IN SOLN
3.0000 mL | Freq: Four times a day (QID) | RESPIRATORY_TRACT | Status: DC
  Administered 2015-10-14 – 2015-10-15 (×8): 3 mL via RESPIRATORY_TRACT
  Filled 2015-10-14 (×8): qty 3

## 2015-10-14 MED ORDER — FUROSEMIDE 40 MG PO TABS
40.0000 mg | ORAL_TABLET | Freq: Two times a day (BID) | ORAL | Status: DC
  Administered 2015-10-14: 40 mg via ORAL
  Filled 2015-10-14: qty 1

## 2015-10-14 MED ORDER — ALLOPURINOL 100 MG PO TABS
100.0000 mg | ORAL_TABLET | Freq: Every day | ORAL | Status: DC
  Administered 2015-10-14 – 2015-10-17 (×5): 100 mg via ORAL
  Filled 2015-10-14 (×5): qty 1

## 2015-10-14 MED ORDER — NITROGLYCERIN 0.4 MG SL SUBL: 0.4000 mg | SUBLINGUAL_TABLET | SUBLINGUAL | Status: DC | PRN

## 2015-10-14 MED ORDER — GUAIFENESIN ER 600 MG PO TB12
600.0000 mg | ORAL_TABLET | Freq: Two times a day (BID) | ORAL | Status: DC
  Administered 2015-10-14 – 2015-10-18 (×9): 600 mg via ORAL
  Filled 2015-10-14 (×9): qty 1

## 2015-10-14 MED ORDER — BISACODYL 5 MG PO TBEC: 5.0000 mg | DELAYED_RELEASE_TABLET | Freq: Every day | ORAL | Status: DC | PRN

## 2015-10-14 MED ORDER — VITAMIN D 1000 UNITS PO TABS
5000.0000 [IU] | ORAL_TABLET | Freq: Every morning | ORAL | Status: DC
  Administered 2015-10-14 – 2015-10-18 (×5): 5000 [IU] via ORAL
  Filled 2015-10-14 (×5): qty 5

## 2015-10-14 MED ORDER — PAROXETINE MESYLATE 7.5 MG PO CAPS: 7.5000 mg | ORAL_CAPSULE | Freq: Every day | ORAL | Status: DC

## 2015-10-14 MED ORDER — DIPHENHYDRAMINE HCL 25 MG PO CAPS: 25.0000 mg | ORAL_CAPSULE | Freq: Four times a day (QID) | ORAL | Status: DC | PRN

## 2015-10-14 MED ORDER — METHYLPREDNISOLONE SODIUM SUCC 125 MG IJ SOLR
60.0000 mg | Freq: Four times a day (QID) | INTRAMUSCULAR | Status: DC
  Administered 2015-10-14: 60 mg via INTRAVENOUS
  Filled 2015-10-14 (×2): qty 2

## 2015-10-14 MED ORDER — LEVOFLOXACIN IN D5W 500 MG/100ML IV SOLN
500.0000 mg | Freq: Every day | INTRAVENOUS | Status: DC
  Administered 2015-10-14 – 2015-10-17 (×5): 500 mg via INTRAVENOUS
  Filled 2015-10-14 (×6): qty 100

## 2015-10-14 NOTE — Progress Notes (Signed)
Inpatient Diabetes Program Recommendations  AACE/ADA: New Consensus Statement on Inpatient Glycemic Control (2015)  Target Ranges:  Prepandial:   less than 140 mg/dL      Peak postprandial:   less than 180 mg/dL (1-2 hours)      Critically ill patients:  140 - 180 mg/dL   Results for [name], [name] (MRN [id number]) as of 10/14/2015 12:22  Ref. Range 10/14/2015 00:22 10/14/2015 08:15 10/14/2015 11:40  Glucose-Capillary Latest Ref Range: 65-99 mg/dL 195 (H) 231 (H) 398 (H)   Review of Glycemic Control  Diabetes history: DM 2 Outpatient Diabetes medications: Toujeo 40 units, Novolog 7-12 units TID, Metformin 1,000 mg BID Current orders for Inpatient glycemic control: Lantus 40 units, Novolog Resistant  Inpatient Diabetes Program Recommendations: Insulin - Basal: Glucose 200-300 range with IV solumedrol 40 mg Q12hrs. May want to increase basal insulin to 45 units. Insulin - Meal Coverage: Patient takes Novolog 7-12 units TID at home. Please consider ordering Novolog 5-7 units TID meal coverage in addition to correction scale.  Thanks,  Tama Headings RN, MSN, [facility] Inpatient Diabetes Coordinator Team Pager [phone] (8a-5p)

## 2015-10-14 NOTE — H&P (Signed)
[facility] History and Physical  DAKODA BERGMAN [id number] DOB: Oct 20, 1960 DOA: 10/13/2015  Referring physician: ED physician PCP: Garret Reddish, MD  Specialists: Dr. Vaughan Browner (pulm)   Chief Complaint: Cough and dyspnea   HPI: [name] is a medically-complex 55 y.o. female with PMH of pulmonary sarcoidosis on prednisone, insulin-dependent DM, HTN, and COPD not on home O2 who now presents to the ED with progressively worsening dyspnea with coughing fits. She reports being in her usual state until 6 days ago when she developed a non-productive cough and dyspnea on minimal exertion. She saw her PCP for these complaints, was diagnosed with acute URI, and given recommendations for symptom management. Dyspnea is constant, worse with any exertion and better with rest.  Home albuterol nebs did not seem to help. She denies fever, chills, rhinorrhea, sore throat, or sick contacts.  She denies chest pain, palpitations, edema, or orthopnea. She saw her pulmonologist in the clinic last month and per the notes, seemed to be stable from respiratory perspective with no recent flares in sarcoid. She had been on high-dose steroids previously, but weaned down to her current 5mg  daily.    In ED, patient was found to be afebrile, saturating adequately on room air, but with obvious respiratory distress in the form of tachypnea and increased work of breathing. Chest x-ray demonstrated hypoexpansion with bibasilar atelectasis and mild vascular congestion. Initial labs revealed a leukocytosis to 17 thousand, stable chronic anemia, stable CKD 2, and hyperglycemia with serum glucose of 287. She was given nebulized breathing treatments x2 and a 125 mg IV push of Solu-Medrol in the ED, but continued to be obstructed with significantly increased work of breathing. The hospitalists were asked to admit.   Where does patient live?   At home    Can patient participate in ADLs?  Yes      Review of Systems:    General: no fevers, chills, sweats, weight change, poor appetite, or fatigue HEENT: no blurry vision, hearing changes or sore throat Pulm: Dyspnea, cough, and wheeze CV: no exertional chest pain or palpitations. Central chest and back pain with coughing fits Abd: no nausea, vomiting, abdominal pain, or constipation. Diarrhea 1 wk ago, resolved GU: no dysuria, hematuria, increased urinary frequency, or urgency  Ext: no leg edema Neuro: no focal weakness, numbness, or tingling, no vision change or hearing loss Skin: no rash, no wounds MSK: No muscle spasm, no deformity, no red, hot, or swollen joint Heme: No easy bruising or bleeding Travel history: No recent long distant travel    Allergy:  Allergies  Allergen Reactions  . Vancomycin Other (See Comments)    nephrotoxicity  . Chlorhexidine Itching  . Lisinopril Cough  . Methotrexate Other (See Comments)     peri-oral and buccal lesions.  . Clindamycin/Lincomycin Nausea And Vomiting and Rash  . Doxycycline Rash  . Teflaro [Ceftaroline] Rash    Past Medical History  Diagnosis Date  . DIABETES MELLITUS, TYPE II 08/21/2006  . HYPERLIPIDEMIA 08/21/2006  . GOUT 08/20/2010  . OBESITY 06/04/2009  . ANEMIA-UNSPECIFIED 09/18/2009  . HYPERTENSION 08/21/2006  . GERD 08/21/2006  . Internal hemorrhoids   . Pulmonary sarcoidosis ([place])     Followed locally by pulmonology, but also by Dr. Casper Harrison at [facility]  . Exertional chest pain     sharp, substernal, exertional  . Vocal cord dysfunction   . Chronic diastolic heart failure, NYHA class 2 (HCC)     LVEDP roughly 20% by cath  . CHF (  congestive heart failure) ([place])   . COPD (chronic obstructive pulmonary disease) ([place])   . Depression   . Fracture of 5th metatarsal     non union  . Abnormal SPEP 04/17/2014  . Osteomyelitis of left foot ([place]) 05/29/2015  . Diabetic osteomyelitis ([place]) 05/29/2015  . Hx of umbilical hernia repair   . Infection of wound due to methicillin  resistant Staphylococcus aureus (MRSA)   . Wears partial dentures     Past Surgical History  Procedure Laterality Date  . Ventral hernia repair    . Nissen fundoplication  [id number]  . Cholecystectomy  1984  . Abdominal hysterectomy    . Knee arthroscopy      right  . Tubal ligation      with reversal in 1994  . Bladder suspension  11/11/2011    Procedure: TRANSVAGINAL TAPE (TVT) PROCEDURE;  Surgeon: Olga Millers, MD;  Location: [facility];  Service: Gynecology;  Laterality: N/A;  . Cystoscopy  11/11/2011    Procedure: CYSTOSCOPY;  Surgeon: Olga Millers, MD;  Location: [facility];  Service: Gynecology;  Laterality: N/A;  . Doppler echocardiography  02/12/2013    LV FUNCTION, SIZE NORMAL; MILD CONCENTRIC LVH; EST EF 55-65%; WALL MOTION NORMAL  . Lexiscan myoview  03/09/2013    EF 50%; NORMAL MYOCARDIAL PERFUSION STUDY - breast attenuation  . Cardiac catheterization  07/2010    LVEF 50-55% WITH VERY MILD GLOBAL HYPOKINESIA; ESSENTIALLY NORMAL CORONARY ARTERIES; NORMAL LV FUNCTION  . Carotids  02/18/11    CAROTID DUPLEX; VERTEBRALS ARE PATENT WITH ANTEGRADE FLOW. ICA/CCA RATIO 1.61 ON RIGHT AND 0.75 ON LEFT  . Right and left cardiac catheterization  04/23/2013    Angiographic normal coronaries; LVEDP 20 mmHg, PCWP 12-14 mmHg, RAP 12 mmHg.; Fick CO/CI 4.9/2.2  . Metatarsal osteotomy with open reduction internal fixation (orif) metatarsal with fusion Left 04/09/2014    Procedure: LEFT FOOT FRACTURE OPEN TREATMENT METATARSAL INCLUDES INTERNAL FIXATION EACH;  Surgeon: Lorn Junes, MD;  Location: [facility];  Service: Orthopedics;  Laterality: Left;  . Left and right heart catheterization with coronary angiogram N/A 04/23/2013    Procedure: LEFT AND RIGHT HEART CATHETERIZATION WITH CORONARY ANGIOGRAM;  Surgeon: Leonie Man, MD;  Location: [facility];  Service: Cardiovascular;  Laterality: N/A;  . Hernia repair    . Appendectomy    . Fracture surgery    . I&d extremity Left  06/27/2015    Procedure: Partial Excision Left Calcaneus, Place Antibiotic Beads, and Wound VAC;  Surgeon: Newt Minion, MD;  Location: [facility];  Service: Orthopedics;  Laterality: Left;    Social History:  reports that she has never smoked. She has never used smokeless tobacco. She reports that she does not drink alcohol or use illicit drugs.  Family History:  Family History  Problem Relation Age of Onset  . Diabetes Father   . Heart attack Father   . Coronary artery disease Father   . Heart failure Father   . COPD Mother   . Emphysema Mother   . Asthma Mother   . Heart failure Mother   . Sarcoidosis Maternal Uncle   . Colon cancer Neg Hx   . Lung cancer Brother   . Cancer Brother   . Diabetes Brother   . Heart attack Maternal Grandfather      Prior to Admission medications   Medication Sig Start Date End Date Taking? Authorizing Provider  acetaminophen (TYLENOL) 325 MG tablet Take 650 mg by mouth  every 6 (six) hours as needed for headache.   Yes Historical Provider, MD  albuterol (PROVENTIL HFA;VENTOLIN HFA) 108 (90 BASE) MCG/ACT inhaler Inhale 1-2 puffs into the lungs every 6 (six) hours as needed for wheezing or shortness of breath. 09/25/15  Yes Praveen Mannam, MD  allopurinol (ZYLOPRIM) 100 MG tablet Take 100 mg by mouth at bedtime.    Yes Historical Provider, MD  aspirin EC 81 MG tablet Take 81 mg by mouth daily.    Yes Historical Provider, MD  atorvastatin (LIPITOR) 40 MG tablet TAKE ONE TABLET BY MOUTH ONE TIME DAILY 05/06/15  Yes [name], MD  buPROPion (WELLBUTRIN XL) 300 MG 24 hr tablet Take 1 tablet (300 mg total) by mouth daily. 10/03/15  Yes [name], MD  CARTIA XT 120 MG 24 hr capsule TAKE ONE CAPSULE BY MOUTH ONE TIME DAILY IN THE EVENING  10/03/14  Yes Leonie Man, MD  carvedilol (COREG) 25 MG tablet Take 1 tablet (25 mg total) by mouth 2 (two) times daily with a meal. MUST KEEP APPOINTMENT 11/20/2015 WITH DR Ellyn Hack FOR FUTURE REFILLS 10/08/15  Yes  Leonie Man, MD  chlorpheniramine-HYDROcodone (TUSSIONEX) 10-8 MG/5ML SUER Take 5 mLs by mouth every 12 (twelve) hours as needed for cough. 10/10/15  Yes Lucretia Kern, DO  cholecalciferol 5000 UNITS TABS Take 1 tablet (5,000 Units total) by mouth daily. Patient taking differently: Take 5,000 Units by mouth every morning.  04/09/14  Yes Kirstin Shepperson, PA-C  DEXILANT 60 MG capsule TAKE ONE CAPSULE BY MOUTH ONE TIME DAILY 03/14/15  Yes Elsie Stain, MD  diphenhydrAMINE (BENADRYL) 25 mg capsule Take 1 capsule (25 mg total) by mouth every 6 (six) hours as needed for itching. 08/12/15  Yes Truman Hayward, MD  estradiol (ESTRACE) 2 MG tablet Take 2 mg by mouth at bedtime.  01/24/14  Yes Historical Provider, MD  fenofibrate (TRICOR) 145 MG tablet TAKE ONE TABLET BY MOUTH ONE TIME DAILY  Patient taking differently: TAKE ONE TABLET BY MOUTH every evening 11/05/14  Yes Leonie Man, MD  fluticasone [facility]) 50 MCG/ACT nasal spray USE TWO SPRAYS IN [facility] NOSTRIL DAILY 02/03/15  Yes [name], MD  furosemide (LASIX) 40 MG tablet Take 1 tablet (40 mg total) by mouth 2 (two) times daily. 05/01/15  Yes Thurnell Lose, MD  gabapentin (NEURONTIN) 100 MG capsule Take 1 capsule (100 mg total) by mouth 3 (three) times daily. **PT NEEDS AN APPT FOR FURTHER REFILLS** 07/21/15  Yes Philemon Kingdom, MD  HYDROcodone-acetaminophen (NORCO) 10-325 MG per tablet Take 1-2 tablets by mouth every 4 (four) hours as needed for moderate pain.  03/20/15  Yes Historical Provider, MD  insulin aspart (NOVOLOG FLEXPEN) 100 UNIT/ML FlexPen Inject 7-12 Units into the skin 3 (three) times daily with meals. Per sliding scale   Yes Historical Provider, MD  Insulin Glargine (TOUJEO SOLOSTAR) 300 UNIT/ML SOPN Inject 40 Units into the skin at bedtime. 10/24/14  Yes Philemon Kingdom, MD  losartan (COZAAR) 50 MG tablet Take 1 tablet (50 mg total) by mouth daily. Patient taking differently: Take 100 mg by mouth daily.  08/08/15   Yes [name], MD  metFORMIN (GLUCOPHAGE) 1000 MG tablet TAKE ONE TABLET BY MOUTH TWICE DAILY with a meal. 12/29/14  Yes Philemon Kingdom, MD  mometasone-formoterol (DULERA) 200-5 MCG/ACT AERO Inhale 2 puffs into the lungs 2 (two) times daily. 09/25/15  Yes Praveen Mannam, MD  nitroGLYCERIN (NITROSTAT) 0.4 MG SL tablet Place 0.4  mg under the tongue every 5 (five) minutes as needed. x3 doses as needed for chest pain.   Yes Historical Provider, MD  ondansetron (ZOFRAN-ODT) 4 MG disintegrating tablet Take 4 mg by mouth every 8 (eight) hours as needed for nausea or vomiting.  03/31/15  Yes Historical Provider, MD  PARoxetine Mesylate (BRISDELLE) 7.5 MG CAPS Take 7.5 mg by mouth daily.   Yes Historical Provider, MD  potassium chloride SA (K-DUR,KLOR-CON) 20 MEQ tablet Take 1 tablet (20 mEq total) by mouth 3 (three) times daily. 05/01/15  Yes Thurnell Lose, MD  promethazine (PHENERGAN) 25 MG tablet Take 1 tablet (25 mg total) by mouth every 6 (six) hours as needed for nausea or vomiting. 10/04/15  Yes Stevi Barrett, PA-C  venlafaxine XR (EFFEXOR-XR) 75 MG 24 hr capsule Take 1 capsule (75 mg total) by mouth 2 (two) times daily. 10/03/15  Yes [name], MD  vitamin B-12 (CYANOCOBALAMIN) 1000 MCG tablet Take 1,000 mcg by mouth daily.   Yes Historical Provider, MD  B-D ULTRAFINE III SHORT PEN 31G X 8 MM MISC FOLLOW DIRECTIONS PROVIDED BY PHYSICIAN 05/06/15   [name], MD  furosemide (LASIX) 40 MG tablet Take 1 tablet (40 mg total) by mouth 2 (two) times daily. PATIENT NEEDS TO CONTACT OFFICE FOR ADDITIONAL REFILLS Patient not taking: Reported on 10/13/2015 09/30/15   Leonie Man, MD  glucose blood (ONE TOUCH ULTRA TEST) test strip 1 each by Other route 3 (three) times daily. Use as instructed 10/19/13   Lisabeth Pick, MD  Insulin Pen Needle 31G X 8 MM MISC Use to inject insulin 4 times daily. 04/30/15   Philemon Kingdom, MD  NOVOLOG FLEXPEN 100 UNIT/ML FlexPen INJECT 7-12 UNITS INTO THE  SKIN 3 TIMES DAILY WITH MEALS. Patient not taking: Reported on 10/13/2015 08/01/15   Philemon Kingdom, MD    Physical Exam: Filed Vitals:   10/13/15 2200 10/13/15 2215 10/13/15 2230 10/13/15 2300  BP: 137/66 135/69 143/61 124/79  Pulse: 76 76 86 79  Temp:      TempSrc:      Resp: 28 27    SpO2: 100% 100% 98% 100%   General: In moderate respiratory distress with tachypnea to 30 and accessory muscle use HEENT:       Eyes: PERRL, EOMI, no scleral icterus or conjunctival pallor.       ENT: No discharge from the ears or nose, no pharyngeal ulcers, petechiae or exudate.        Neck: No JVD, no bruit, no appreciable mass Heme: No cervical adenopathy, no pallor Cardiac: S1/S2, RRR, No murmurs, No gallops or rubs. Pulm: Diminished breath sounds bilaterally, audible wheezes, tachypnea, using abd and neck muscles. Abd: Soft, nondistended, nontender, no rebound pain or gaurding, BS present. Ext: No LE edema bilaterally. 2+DP/PT pulse bilaterally. Musculoskeletal: No gross deformity, no red, hot, swollen joints, no limitation in ROM  Skin: No rashes or wounds on exposed surfaces  Neuro: Alert, oriented X3, cranial nerves II-XII grossly intact. No focal findings Psych: Patient is not overtly psychotic, mood/affect appropriate.  Labs on Admission:  Basic Metabolic Panel:  Recent Labs Lab 10/13/15 2143  NA 138  K 4.1  CL 98*  CO2 28  GLUCOSE 287*  BUN 25*  CREATININE 1.08*  CALCIUM 9.3   Liver Function Tests: No results for input(s): AST, ALT, ALKPHOS, BILITOT, PROT, ALBUMIN in the last 168 hours. No results for input(s): LIPASE, AMYLASE in the last 168 hours. No results for input(s): AMMONIA  in the last 168 hours. CBC:  Recent Labs Lab 10/13/15 2143  WBC 17.0*  HGB 10.2*  HCT 31.5*  MCV 89.7  PLT 433*   Cardiac Enzymes: No results for input(s): CKTOTAL, CKMB, CKMBINDEX, TROPONINI in the last 168 hours.  BNP (last 3 results) No results for input(s): BNP in the last 8760  hours.  ProBNP (last 3 results) No results for input(s): PROBNP in the last 8760 hours.  CBG: No results for input(s): GLUCAP in the last 168 hours.  Radiological Exams on Admission: Dg Chest 2 View  10/13/2015  CLINICAL DATA:  Acute onset of shortness of breath and generalized chest pain. Initial encounter. EXAM: CHEST  2 VIEW COMPARISON:  Chest radiograph performed 05/07/2015 FINDINGS: The lungs are hypoexpanded. Mild bibasilar atelectasis is noted. Mild vascular crowding and vascular congestion are noted. No pleural effusion or pneumothorax is seen. The heart is borderline normal in size. No acute osseous abnormalities are seen. Scattered clips are noted about the gastroesophageal junction. IMPRESSION: Lungs hypoexpanded. Mild bibasilar atelectasis noted. Mild vascular congestion seen. Electronically Signed   By: Garald Balding M.D.   On: 10/13/2015 21:50    EKG: Independently reviewed.  Abnormal findings:  Appears to be a sinus rhythm with low-voltage    Assessment/Plan  1. COPD with acute exacerbation - Suspect acute bacterial exacerbation of chronic bronchitis  - Admit to telemetry with continuous pulse oximetry  - Systemic steroids with Solu-Medrol 60 mg q6, to be tapered as tolerated  - DuoNebs scheduled q6h, also available q2h prn  - Start Levaquin  - Checking procalcitonin, lactate  - Keep O2 sat 92% with prn supplemental O2  - Likely a component of obesity hypoventilation syndrome per pulmonology notes   2. Sarcoidosis, pulmonary  - Per recent pulmonology notes, has been stable on 5 mg prednisone daily with no recent flare  - Treating with high-dose systemic steroids currently as above  - No new nodules noted on CXR    3. Hypertension - Normotensive currently  - Holding Coreg for now given acute bronchospasm  - Continue home-dose losartan and diltiazem  - Monitoring on telemetry   4. Insulin-dependent DM 2 - Hold home metformin and insulins while inpt  - Last A1c  7.4% on 10/07/2015, no need to repeat  - 40 units insulin glargine qHS with resistant SSI correctional  - CBGs QID, may need to increase insulins given steroid use, will monitor    5. Anemia, normocytic   - Hgb 10.2 on admission, appears stable  - Continue iron supplements - Will trend, do not anticipate transfusion needs    6. Chronic diastolic CHF  - TTE (123456) with EF 0000000, grade 1 diastolic dysfunction  - Pt euvolemic clinically  - Continuing home-dose PO Lasix and SLIV for now  - Check BNP, strict I/Os, daily wts - Continue ARB, resume beta-blocker as appropriate    7. Leukocytosis  - WBC 17k on admission  - Pt on chronic prednisone tx, but prior CBCs have not had leukocytosis  - Likely reflective of acute bacterial exacerbation of chronic bronchitis with workup and treatment outlined above   DVT ppx:  SQ Lovenox     Code Status: Full code Family Communication:  Yes, patient's husband at bed side Disposition Plan: Admit to inpatient   Date of Service 10/14/2015    Vianne Bulls, MD [facility] Pager [phone]  If 7PM-7AM, please contact night-coverage [email or web address] Password [email or web address] 10/14/2015, 12:02 AM

## 2015-10-14 NOTE — Progress Notes (Signed)
Pt has a critical lab value of 2.3 for lactic acid. MD notified.

## 2015-10-14 NOTE — Progress Notes (Addendum)
Nutrition Brief Note  Patient identified on the Malnutrition Screening Tool (MST) Report and consult to assess nutritional needs/requirements (COPD GOLD protocol).  Wt Readings from Last 15 Encounters:  10/14/15 254 lb 10.1 oz (115.5 kg)  10/10/15 252 lb 14.4 oz (114.715 kg)  10/07/15 252 lb 12.8 oz (114.669 kg)  10/04/15 250 lb (113.399 kg)  09/25/15 255 lb 3.2 oz (115.758 kg)  09/11/15 253 lb (114.76 kg)  08/08/15 257 lb (116.574 kg)  07/31/15 255 lb (115.667 kg)  07/30/15 255 lb (115.667 kg)  06/27/15 250 lb (113.399 kg)  06/25/15 250 lb 11.2 oz (113.717 kg)  06/03/15 250 lb (113.399 kg)  05/29/15 250 lb (113.399 kg)  05/28/15 249 lb 9.6 oz (113.218 kg)  05/13/15 253 lb (114.76 kg)   [name] is a medically-complex 55 y.o. female with PMH of pulmonary sarcoidosis on prednisone, insulin-dependent DM, HTN, and COPD not on home O2 who now presents to the ED with progressively worsening dyspnea with coughing fits. She reports being in her usual state until 6 days ago when she developed a non-productive cough and dyspnea on minimal exertion.   Pt up walking with staff in room at time of visit.   Wt hx reviewed. Wt has been stable > 1 year. Meal completion 80% per MAR.   Pt did not appear to have any physical signs of malnutrition.  Body mass index is 41.12 kg/(m^2). Patient meets criteria for extreme obesity, class III based on current BMI.   Current diet order is Heart Healthy/ Carb Modified, patient is consuming approximately 80% of meals at this time. Labs and medications reviewed.   No nutrition interventions warranted at this time. If nutrition issues arise, please consult RD.   [name] A. Jimmye Norman, RD, LDN, CDE Pager: [phone] After hours Pager: [phone]

## 2015-10-14 NOTE — Evaluation (Signed)
Physical Therapy Evaluation Patient Details Name: [name] MRN: [id number] DOB: 1961-09-17 Today's Date: 10/14/2015   History of Present Illness  [name] a medically-complex 55 y.o. female with PMH of pulmonary sarcoidosis on prednisone, insulin-dependent DM, HTN, and COPD not on home O2 who now presents to the ED with progressively worsening dyspnea with coughing fits.  Clinical Impression  Pt doing well with mobility and no further PT needed.  Ready for dc from PT standpoint.      Follow Up Recommendations No PT follow up    Equipment Recommendations  None recommended by PT    Recommendations for Other Services       Precautions / Restrictions Precautions Precautions: None Restrictions Weight Bearing Restrictions: No      Mobility  Bed Mobility Overal bed mobility: Modified Independent             General bed mobility comments: extra time, used rails  Transfers Overall transfer level: Modified independent Equipment used: None Transfers: Sit to/from Stand Sit to Stand: Modified independent (Device/Increase time)            Ambulation/Gait Ambulation/Gait assistance: Modified independent (Device/Increase time) Ambulation Distance (Feet): 375 Feet Assistive device: None Gait Pattern/deviations: WFL(Within Functional Limits)     General Gait Details: Steady gait. SpO2 .94% on RA with amb  Stairs            Wheelchair Mobility    Modified Rankin (Stroke Patients Only)       Balance Overall balance assessment: No apparent balance deficits (not formally assessed)                                           Pertinent Vitals/Pain Pain Assessment: No/denies pain    Home Living Family/patient expects to be discharged to:: Private residence Living Arrangements: Spouse/significant other Available Help at Discharge: Family;Available PRN/intermittently Type of Home: House Home Access: Stairs to enter Entrance  Stairs-Rails: Right Entrance Stairs-Number of Steps: 2 Home Layout: One level Home Equipment: Walker - 2 wheels;Bedside commode Additional Comments: Husband works during the day but parents live next door who can assist prn    Prior Function Level of Independence: Independent               Hand Dominance        Extremity/Trunk Assessment   Upper Extremity Assessment: Overall WFL for tasks assessed           Lower Extremity Assessment: Overall WFL for tasks assessed         Communication   Communication: No difficulties  Cognition Arousal/Alertness: Awake/alert Behavior During Therapy: WFL for tasks assessed/performed Overall Cognitive Status: Within Functional Limits for tasks assessed                      General Comments      Exercises        Assessment/Plan    PT Assessment Patent does not need any further PT services  PT Diagnosis Difficulty walking   PT Problem List    PT Treatment Interventions     PT Goals (Current goals can be found in the Care Plan section) Acute Rehab PT Goals PT Goal Formulation: All assessment and education complete, DC therapy    Frequency     Barriers to discharge        Co-evaluation  End of Session   Activity Tolerance: Patient tolerated treatment well Patient left: in chair;with call bell/phone within reach;with family/visitor present Nurse Communication: Mobility status         Time: [id number] PT Time Calculation (min) (ACUTE ONLY): 15 min   Charges:   PT Evaluation $PT Eval Low Complexity: 1 Procedure     PT G Codes:        Rande Dario 11/03/15, 5:20 PM [facility] PT [phone]

## 2015-10-14 NOTE — Progress Notes (Addendum)
Occupational Therapy Evaluation Patient Details Name: [name] MRN: [id number] DOB: 08-29-1961 Today's Date: 10/14/2015    History of Present Illness [name] a medically-complex 55 y.o. female with PMH of pulmonary sarcoidosis on prednisone, insulin-dependent DM, HTN, and COPD not on home O2 who now presents to the ED with progressively worsening dyspnea with coughing fits.   Clinical Impression   Pt admitted with the above diagnoses and presents with below problem list. Pt will benefit from continued acute OT to address the below listed deficits and maximize independence with BADLs prior to d/c home. PTA pt was independent with ADLs. [name] currently supervision for OOB ADLs, min guard for tub shower transfer. OT to continue to follow acutely to reinforce energy conservation and breathing techniques during ADLs and would benefit from tub shower transfer practice.      Follow Up Recommendations  Supervision - Intermittent;No OT follow up    Equipment Recommendations  None recommended by OT;Other (comment) (discussed pt installing grab bars in shower)    Recommendations for Other Services PT consult     Precautions / Restrictions Restrictions Weight Bearing Restrictions: No      Mobility Bed Mobility Overal bed mobility: Modified Independent             General bed mobility comments: extra time, used rails  Transfers Overall transfer level: Needs assistance Equipment used: None Transfers: Sit to/from Stand Sit to Stand: Supervision         General transfer comment: from EOB and comfort height toilet with grab bar. supervision for safety    Balance Overall balance assessment: Needs assistance Sitting-balance support: No upper extremity supported;Feet supported Sitting balance-Leahy Scale: Good Sitting balance - Comments: donned/doffed socks sitting EOB   Standing balance support: No upper extremity supported;During functional activity Standing  balance-Leahy Scale: Good Standing balance comment: stood to complete grooming tasks. supervision for safety.                             ADL Overall ADL's : Needs assistance/impaired Eating/Feeding: Set up;Sitting   Grooming: Supervision/safety;Wash/dry hands;Wash/dry face;Oral care;Brushing hair;Standing   Upper Body Bathing: Set up;Sitting   Lower Body Bathing: Supervison/ safety;Sit to/from stand   Upper Body Dressing : Set up;Sitting   Lower Body Dressing: Supervision/safety;Sit to/from stand   Toilet Transfer: Supervision/safety;Ambulation;Comfort height toilet;Grab bars   Toileting- Clothing Manipulation and Hygiene: Supervision/safety;Sit to/from stand   Tub/ Shower Transfer: Tub transfer;Min guard;Ambulation   Functional mobility during ADLs: Supervision/safety General ADL Comments: Pt completed in-room functional mobility, grooming, and toilet transfer at levels detailed above. Pt with some DOE noted. Pt able to don/doff socks in sitting position. Educated on energy conservation including handout.      Vision     Perception     Praxis      Pertinent Vitals/Pain Pain Assessment: No/denies pain     Hand Dominance     Extremity/Trunk Assessment Upper Extremity Assessment Upper Extremity Assessment: Overall WFL for tasks assessed   Lower Extremity Assessment Lower Extremity Assessment: Defer to PT evaluation       Communication Communication Communication: No difficulties   Cognition Arousal/Alertness: Awake/alert Behavior During Therapy: WFL for tasks assessed/performed Overall Cognitive Status: Within Functional Limits for tasks assessed                     General Comments    Pt on RA with O2 for most of session ranging 93-100. Did  note 1 episode of O2 at 87-90 after completing toilet transfer with pt in static standing at sink. Educated pt on breathing techniques during OOB ADLs.    Exercises       Shoulder Instructions       Home Living Family/patient expects to be discharged to:: Private residence Living Arrangements: Spouse/significant other Available Help at Discharge: Family;Available PRN/intermittently Type of Home: House Home Access: Stairs to enter [employer] of Steps: 2 Entrance Stairs-Rails: Right Home Layout: One level     Bathroom Shower/Tub: Tub/shower unit;Door Shower/tub characteristics: Door Biochemist, clinical: Standard     Home Equipment: Environmental consultant - 2 wheels;Bedside commode   Additional Comments: Husband works during the day but parents live next door who can assist prn      Prior Functioning/Environment Level of Independence: Independent             OT Diagnosis: Generalized weakness   OT Problem List: Decreased activity tolerance;Impaired balance (sitting and/or standing);Decreased knowledge of use of DME or AE;Decreased knowledge of precautions   OT Treatment/Interventions: Self-care/ADL training;Therapeutic exercise;Energy conservation;DME and/or AE instruction;Therapeutic activities;Patient/family education    OT Goals(Current goals can be found in the care plan section) Acute Rehab OT Goals Patient Stated Goal: not stated OT Goal Formulation: With patient Time For Goal Achievement: 10/21/15 Potential to Achieve Goals: Good ADL Goals Pt Will Perform Tub/Shower Transfer: Tub transfer;Independently;ambulating Additional ADL Goal #1: Pt will independently incorporate 1 energy conservation strategy into ADL.  OT Frequency: Min 1X/week   Barriers to D/C:            Co-evaluation              End of Session    Activity Tolerance: Patient tolerated treatment well Patient left: in bed;with call bell/phone within reach;with family/visitor present   Time: [id number] OT Time Calculation (min): 26 min Charges:  OT General Charges $OT Visit: 1 Procedure OT Evaluation $Initial OT Evaluation Tier I: 1 Procedure OT Treatments $Self Care/Home Management  : 8-22 mins G-Codes:    [name] Oct 24, 2015, 9:53 AM

## 2015-10-14 NOTE — Progress Notes (Addendum)
[facility] Progress Note   [name]  [id number]  DOB: 1960-12-20  DOA: 10/13/2015 PCP: Garret Reddish, MD  Brief narrative: [name] is a 55 y.o. female PMH of pulmonary sarcoidosis on prednisone, insulin-dependent DM, HTN, and COPD who presents for cough and shortness of breath. She has had the cough for about 1 wk now.    Subjective: Continue to have a cough with congestion. Short of breath with ambulation.   Assessment/Plan: Principal Problem:   COPD exacerbation/acute bronchitis -no requiring O2 -cont steroids, Nebs, Dulera and Levaquin  Active Problems: Lactic acidosis - mild- improved  Mild elevation in troponin - likely due to underlying resp issues- no chest pain- will stop checking    Sarcoidosis of lung - dose of steroids increased for above    Essential hypertension -cont Cozaar and Diltiazem-  Coreg has been held- will resume    Type II diabetes mellitus with neurological manifestations, uncontrolled  - on Toujeo at home- cont Lantus and Novolog    Chronic diastolic heart failure, NYHA class 2 - d/c lasix- cont slow IVF for 12 more hrs      Antibiotics: Anti-infectives    Start     Dose/Rate Route Frequency Ordered Stop   10/14/15 0130  levofloxacin (LEVAQUIN) IVPB 500 mg     500 mg 100 mL/hr over 60 Minutes Intravenous Daily at bedtime 10/14/15 0047 10/20/15 2159     Code Status:     Code Status Orders        Start     Ordered   10/14/15 0012  Full code   Continuous     10/14/15 0013     Family Communication:   Disposition Plan: home in 1-2 days DVT prophylaxis: Lovenox Consultants:  Procedures:     Objective: Filed Weights   10/14/15 0336  Weight: 115.5 kg (254 lb 10.1 oz)    Intake/Output Summary (Last 24 hours) at 10/14/15 1451 Last data filed at 10/14/15 1324  Gross per 24 hour  Intake    450 ml  Output   1750 ml  Net  -1300 ml     Vitals Filed Vitals:   10/14/15 0834 10/14/15 0943  10/14/15 1324 10/14/15 1349  BP:  152/68 153/54   Pulse:   95   Temp:   97.8 F (36.6 C)   TempSrc:   Oral   Resp:   22   Height:      Weight:      SpO2: 95%  98% 98%    Exam:  General:  [name] alert, not in acute distress  HEENT: No icterus, No thrush, oral mucosa moist  Cardiovascular: regular rate and rhythm, S1/S2 No murmur  Respiratory: mild b/l rhonchi  Abdomen: Soft, +Bowel sounds, non tender, non distended, no guarding  MSK: No cyanosis or clubbing- no pedal edema   Data Reviewed: Basic Metabolic Panel:  Recent Labs Lab 10/13/15 2143 10/14/15 0545  NA 138 135  K 4.1 4.2  CL 98* 98*  CO2 28 26  GLUCOSE 287* 283*  BUN 25* 24*  CREATININE 1.08* 1.15*  CALCIUM 9.3 8.6*   Liver Function Tests: No results for input(s): AST, ALT, ALKPHOS, BILITOT, PROT, ALBUMIN in the last 168 hours. No results for input(s): LIPASE, AMYLASE in the last 168 hours. No results for input(s): AMMONIA in the last 168 hours. CBC:  Recent Labs Lab 10/13/15 2143 10/14/15 0545  WBC 17.0* 14.7*  NEUTROABS  --  12.9*  HGB 10.2* 10.1*  HCT 31.5* 31.2*  MCV 89.7 88.9  PLT 433* 390   Cardiac Enzymes:  Recent Labs Lab 10/14/15 0049 10/14/15 0545 10/14/15 1153  TROPONINI <0.03 <0.03 0.04*   BNP (last 3 results)  Recent Labs  10/14/15 0049  BNP 39.7    ProBNP (last 3 results) No results for input(s): PROBNP in the last 8760 hours.  CBG:  Recent Labs Lab 10/14/15 0022 10/14/15 0815 10/14/15 1140  GLUCAP 195* 231* 398*    Recent Results (from the past 240 hour(s))  Culture, sputum-assessment     Status: None   Collection Time: 10/14/15  9:23 AM  Result Value Ref Range Status   Specimen Description SPUTUM  Final   Special Requests Normal  Final   Sputum evaluation   Final    THIS SPECIMEN IS ACCEPTABLE. RESPIRATORY CULTURE REPORT TO FOLLOW.   Report Status 10/14/2015 FINAL  Final     Studies: Dg Chest 2 View  10/13/2015  CLINICAL DATA:  Acute onset of  shortness of breath and generalized chest pain. Initial encounter. EXAM: CHEST  2 VIEW COMPARISON:  Chest radiograph performed 05/07/2015 FINDINGS: The lungs are hypoexpanded. Mild bibasilar atelectasis is noted. Mild vascular crowding and vascular congestion are noted. No pleural effusion or pneumothorax is seen. The heart is borderline normal in size. No acute osseous abnormalities are seen. Scattered clips are noted about the gastroesophageal junction. IMPRESSION: Lungs hypoexpanded. Mild bibasilar atelectasis noted. Mild vascular congestion seen. Electronically Signed   By: Garald Balding M.D.   On: 10/13/2015 21:50    Scheduled Meds:  Scheduled Meds: . allopurinol  100 mg Oral QHS  . aspirin EC  81 mg Oral Daily  . atorvastatin  40 mg Oral q1800  . buPROPion  300 mg Oral Daily  . cholecalciferol  5,000 Units Oral q morning - 10a  . Difluprednate  1 drop Ophthalmic Q2H while awake  . diltiazem  120 mg Oral Daily  . enoxaparin (LOVENOX) injection  55 mg Subcutaneous QHS  . estradiol  2 mg Oral QHS  . feeding supplement (ENSURE ENLIVE)  237 mL Oral BID BM  . fenofibrate  160 mg Oral Daily  . fluticasone  2 spray Each Nare Daily  . furosemide  40 mg Oral BID  . gabapentin  100 mg Oral TID  . guaiFENesin  600 mg Oral BID  . insulin aspart  0-20 Units Subcutaneous TID WC  . insulin glargine  40 Units Subcutaneous QHS  . ipratropium-albuterol  3 mL Nebulization Q6H  . levofloxacin (LEVAQUIN) IV  500 mg Intravenous QHS  . losartan  100 mg Oral Daily  . methylPREDNISolone (SOLU-MEDROL) injection  40 mg Intravenous Q12H  . mometasone-formoterol  2 puff Inhalation BID  . pantoprazole  40 mg Oral Daily  . PARoxetine  10 mg Oral Daily  . potassium chloride SA  20 mEq Oral BID  . sodium chloride  3 mL Intravenous Q12H  . sodium chloride  3 mL Intravenous Q12H  . venlafaxine XR  75 mg Oral BID  . vitamin B-12  1,000 mcg Oral Daily   Continuous Infusions: . sodium chloride 75 mL/hr at  10/14/15 0942    Time spent on care of this patient: 35 min   [place], MD 10/14/2015, 2:51 PM  LOS: 1 day   [facility] Office  [phone] Pager - Text Page per [email or web address] If 7PM-7AM, please contact night-coverage [email or web address]

## 2015-10-14 NOTE — Care Management Note (Signed)
Case Management Note  Patient Details  Name: MARLENIE MALAS MRN: [id number] Date of Birth: Jan 11, 1961  Subjective/Objective:                  Date-10-14-15 Initial Assessment Spoke with patient at the bedside Introduced self as case manager and explained role in discharge planning and how to be reached.  Verified patient lives at home with spouse in [facility] Verified patient anticipates to go home with spouse, at time of discharge.  Patient has DME walker scooter. Expressed potential need for no other DME.  Patient denied needing help with their medication.  Patient drives to MD appointments.  Verified patient has PCP [place] Patient states they currently receive [facility] services through no one.   Plan: CM will continue to follow for discharge planning and [facility] resources.   Carles Collet RN BSN CM [phone]   Action/Plan:  No CM needs identified at this time.   Expected Discharge Date:                  Expected Discharge Plan:  Home/Self Care  In-House Referral:     Discharge planning Services  CM Consult  Post Acute Care Choice:    Choice offered to:     DME Arranged:    DME Agency:     HH Arranged:    HH Agency:     Status of Service:  In process, will continue to follow  Medicare Important Message Given:    Date Medicare IM Given:    Medicare IM give by:    Date Additional Medicare IM Given:    Additional Medicare Important Message give by:     If discussed at [place] of Stay Meetings, dates discussed:    Additional Comments:  Carles Collet, RN 10/14/2015, 4:25 PM

## 2015-10-14 NOTE — Consult Note (Signed)
OPHTHALMOLOGY CONSULT NOTE  Date: 10/14/15 Time: 12:15 PM  Patient Name: [name]  DOB: 06-21-1961 MRN: [id number]   HPI:  This is a 55 y.o. female admitted for bronchitis who was seen in my clinic [facility]) yesterday for severe post op inflammation with hypopyon. Pt was started on durezol q2h and given atropine. Given risk of this being delayed endophthalmitis fu was needed today.   Prior to Admission medications   Medication Sig Start Date End Date Taking? Authorizing Provider  acetaminophen (TYLENOL) 325 MG tablet Take 650 mg by mouth every 6 (six) hours as needed for headache.   Yes Historical Provider, MD  albuterol (PROVENTIL HFA;VENTOLIN HFA) 108 (90 BASE) MCG/ACT inhaler Inhale 1-2 puffs into the lungs every 6 (six) hours as needed for wheezing or shortness of breath. 09/25/15  Yes Praveen Mannam, MD  allopurinol (ZYLOPRIM) 100 MG tablet Take 100 mg by mouth at bedtime.    Yes Historical Provider, MD  aspirin EC 81 MG tablet Take 81 mg by mouth daily.    Yes Historical Provider, MD  atorvastatin (LIPITOR) 40 MG tablet TAKE ONE TABLET BY MOUTH ONE TIME DAILY 05/06/15  Yes [name], MD  buPROPion (WELLBUTRIN XL) 300 MG 24 hr tablet Take 1 tablet (300 mg total) by mouth daily. 10/03/15  Yes [name], MD  CARTIA XT 120 MG 24 hr capsule TAKE ONE CAPSULE BY MOUTH ONE TIME DAILY IN THE EVENING  10/03/14  Yes Leonie Man, MD  carvedilol (COREG) 25 MG tablet Take 1 tablet (25 mg total) by mouth 2 (two) times daily with a meal. MUST KEEP APPOINTMENT 11/20/2015 WITH DR Ellyn Hack FOR FUTURE REFILLS 10/08/15  Yes Leonie Man, MD  chlorpheniramine-HYDROcodone (TUSSIONEX) 10-8 MG/5ML SUER Take 5 mLs by mouth every 12 (twelve) hours as needed for cough. 10/10/15  Yes Lucretia Kern, DO  cholecalciferol 5000 UNITS TABS Take 1 tablet (5,000 Units total) by mouth daily. Patient taking differently: Take 5,000 Units by mouth every morning.  04/09/14  Yes Kirstin  Shepperson, PA-C  DEXILANT 60 MG capsule TAKE ONE CAPSULE BY MOUTH ONE TIME DAILY 03/14/15  Yes Elsie Stain, MD  diphenhydrAMINE (BENADRYL) 25 mg capsule Take 1 capsule (25 mg total) by mouth every 6 (six) hours as needed for itching. 08/12/15  Yes Truman Hayward, MD  estradiol (ESTRACE) 2 MG tablet Take 2 mg by mouth at bedtime.  01/24/14  Yes Historical Provider, MD  fenofibrate (TRICOR) 145 MG tablet TAKE ONE TABLET BY MOUTH ONE TIME DAILY  Patient taking differently: TAKE ONE TABLET BY MOUTH every evening 11/05/14  Yes Leonie Man, MD  fluticasone [facility]) 50 MCG/ACT nasal spray USE TWO SPRAYS IN [facility] NOSTRIL DAILY 02/03/15  Yes [name], MD  furosemide (LASIX) 40 MG tablet Take 1 tablet (40 mg total) by mouth 2 (two) times daily. 05/01/15  Yes Thurnell Lose, MD  gabapentin (NEURONTIN) 100 MG capsule Take 1 capsule (100 mg total) by mouth 3 (three) times daily. **PT NEEDS AN APPT FOR FURTHER REFILLS** 07/21/15  Yes Philemon Kingdom, MD  HYDROcodone-acetaminophen (NORCO) 10-325 MG per tablet Take 1-2 tablets by mouth every 4 (four) hours as needed for moderate pain.  03/20/15  Yes Historical Provider, MD  insulin aspart (NOVOLOG FLEXPEN) 100 UNIT/ML FlexPen Inject 7-12 Units into the skin 3 (three) times daily with meals. Per sliding scale   Yes Historical Provider, MD  Insulin Glargine (TOUJEO SOLOSTAR) 300 UNIT/ML SOPN Inject 40 Units  into the skin at bedtime. 10/24/14  Yes Philemon Kingdom, MD  losartan (COZAAR) 50 MG tablet Take 1 tablet (50 mg total) by mouth daily. Patient taking differently: Take 100 mg by mouth daily.  08/08/15  Yes [name], MD  metFORMIN (GLUCOPHAGE) 1000 MG tablet TAKE ONE TABLET BY MOUTH TWICE DAILY with a meal. 12/29/14  Yes Philemon Kingdom, MD  mometasone-formoterol (DULERA) 200-5 MCG/ACT AERO Inhale 2 puffs into the lungs 2 (two) times daily. 09/25/15  Yes Praveen Mannam, MD  nitroGLYCERIN (NITROSTAT) 0.4 MG SL tablet Place 0.4 mg under the  tongue every 5 (five) minutes as needed. x3 doses as needed for chest pain.   Yes Historical Provider, MD  ondansetron (ZOFRAN-ODT) 4 MG disintegrating tablet Take 4 mg by mouth every 8 (eight) hours as needed for nausea or vomiting.  03/31/15  Yes Historical Provider, MD  PARoxetine Mesylate (BRISDELLE) 7.5 MG CAPS Take 7.5 mg by mouth daily.   Yes Historical Provider, MD  potassium chloride SA (K-DUR,KLOR-CON) 20 MEQ tablet Take 1 tablet (20 mEq total) by mouth 3 (three) times daily. 05/01/15  Yes Thurnell Lose, MD  promethazine (PHENERGAN) 25 MG tablet Take 1 tablet (25 mg total) by mouth every 6 (six) hours as needed for nausea or vomiting. 10/04/15  Yes Stevi Barrett, PA-C  venlafaxine XR (EFFEXOR-XR) 75 MG 24 hr capsule Take 1 capsule (75 mg total) by mouth 2 (two) times daily. 10/03/15  Yes [name], MD  vitamin B-12 (CYANOCOBALAMIN) 1000 MCG tablet Take 1,000 mcg by mouth daily.   Yes Historical Provider, MD  B-D ULTRAFINE III SHORT PEN 31G X 8 MM MISC FOLLOW DIRECTIONS PROVIDED BY PHYSICIAN 05/06/15   [name], MD  furosemide (LASIX) 40 MG tablet Take 1 tablet (40 mg total) by mouth 2 (two) times daily. PATIENT NEEDS TO CONTACT OFFICE FOR ADDITIONAL REFILLS Patient not taking: Reported on 10/13/2015 09/30/15   Leonie Man, MD  glucose blood (ONE TOUCH ULTRA TEST) test strip 1 each by Other route 3 (three) times daily. Use as instructed 10/19/13   Lisabeth Pick, MD  Insulin Pen Needle 31G X 8 MM MISC Use to inject insulin 4 times daily. 04/30/15   Philemon Kingdom, MD  NOVOLOG FLEXPEN 100 UNIT/ML FlexPen INJECT 7-12 UNITS INTO THE SKIN 3 TIMES DAILY WITH MEALS. Patient not taking: Reported on 10/13/2015 08/01/15   Philemon Kingdom, MD    Past Medical History  Diagnosis Date  . DIABETES MELLITUS, TYPE II 08/21/2006  . HYPERLIPIDEMIA 08/21/2006  . GOUT 08/20/2010  . OBESITY 06/04/2009  . ANEMIA-UNSPECIFIED 09/18/2009  . HYPERTENSION 08/21/2006  . GERD 08/21/2006  . Internal  hemorrhoids   . Pulmonary sarcoidosis ([place])     Followed locally by pulmonology, but also by Dr. Casper Harrison at [facility]  . Exertional chest pain     sharp, substernal, exertional  . Vocal cord dysfunction   . Chronic diastolic heart failure, NYHA class 2 (HCC)     LVEDP roughly 20% by cath  . CHF (congestive heart failure) ([place])   . COPD (chronic obstructive pulmonary disease) ([place])   . Depression   . Fracture of 5th metatarsal     non union  . Abnormal SPEP 04/17/2014  . Osteomyelitis of left foot ([place]) 05/29/2015  . Diabetic osteomyelitis ([place]) 05/29/2015  . Hx of umbilical hernia repair   . Infection of wound due to methicillin resistant Staphylococcus aureus (MRSA)   . Wears partial dentures  family history includes Asthma in her mother; COPD in her mother; Cancer in her brother; Coronary artery disease in her father; Diabetes in her brother and father; Emphysema in her mother; Heart attack in her father and maternal grandfather; Heart failure in her father and mother; Lung cancer in her brother; Sarcoidosis in her maternal uncle. There is no history of Colon cancer.  Social History   Occupational History  . DISABLED    Social History Main Topics  . Smoking status: Never Smoker   . Smokeless tobacco: Never Used  . Alcohol Use: No  . Drug Use: No  . Sexual Activity: Yes    Birth Control/ Protection: Surgical    Allergies  Allergen Reactions  . Vancomycin Other (See Comments)    nephrotoxicity  . Chlorhexidine Itching  . Lisinopril Cough  . Methotrexate Other (See Comments)     peri-oral and buccal lesions.  . Clindamycin/Lincomycin Nausea And Vomiting and Rash  . Doxycycline Rash  . Teflaro [Ceftaroline] Rash    ROS: Positive as above, otherwise negative.  EXAM:  Mental Status: A&O x 3   Base Exam: Right Eye Left Eye  Visual Acuity (At near)  20/30  IOP ([place])  13  Pupillary Exam No APD Dilated  Motility  Full   Confrontation VF  Full     Anterior Segment Exam    Lids/Lashes WNL WNL  Conjuctiva White and Quiet Trace injection, vastly improved from yesterday  Cornea Clear Clear  Anterior Chamber Deep and Quiet Deep, resolved hypopyon  Iris Round, Reactive Round, Reactive  Lens  PCIOL  Vitreous  WNL   Poster Segment Exam    Disc  Sharp  CD ratio    Macula  Flat  Vessels  WNL  Periphery  Attached   Radiographic Studies Reviewed: Dg Chest 2 View  10/13/2015  CLINICAL DATA:  Acute onset of shortness of breath and generalized chest pain. Initial encounter. EXAM: CHEST  2 VIEW COMPARISON:  Chest radiograph performed 05/07/2015 FINDINGS: The lungs are hypoexpanded. Mild bibasilar atelectasis is noted. Mild vascular crowding and vascular congestion are noted. No pleural effusion or pneumothorax is seen. The heart is borderline normal in size. No acute osseous abnormalities are seen. Scattered clips are noted about the gastroesophageal junction. IMPRESSION: Lungs hypoexpanded. Mild bibasilar atelectasis noted. Mild vascular congestion seen. Electronically Signed   By: Garald Balding M.D.   On: 10/13/2015 21:50    Assessment and Recommendation: Post op inflammation left eye: Doing well, improved from yesterday. Hypopyon appears resolved.  Continue durezol q2h while awake.  Still dilated from atropine. Plan for fu on Thursday in office if discharged.   Please call with any questions.  Jola Schmidt MD [facility] [phone]

## 2015-10-15 LAB — HEMOGLOBIN A1C
Hgb A1c MFr Bld: 8.4 % — ABNORMAL HIGH (ref 4.8–5.6)
Mean Plasma Glucose: 194 mg/dL

## 2015-10-15 LAB — GLUCOSE, CAPILLARY
Glucose-Capillary: 189 mg/dL — ABNORMAL HIGH (ref 65–99)
Glucose-Capillary: 384 mg/dL — ABNORMAL HIGH (ref 65–99)
Glucose-Capillary: 253 mg/dL — ABNORMAL HIGH (ref 65–99)
Glucose-Capillary: 345 mg/dL — ABNORMAL HIGH (ref 65–99)

## 2015-10-15 LAB — TROPONIN I: Troponin I: <0.03 ng/mL (ref <0.031)

## 2015-10-15 LAB — PROCALCITONIN: Procalcitonin: <0.1 ng/mL

## 2015-10-15 MED ORDER — BENZONATATE 100 MG PO CAPS: 100.0000 mg | ORAL_CAPSULE | Freq: Three times a day (TID) | ORAL | Status: DC | PRN

## 2015-10-15 MED ORDER — IPRATROPIUM-ALBUTEROL 0.5-2.5 (3) MG/3ML IN SOLN
3.0000 mL | Freq: Three times a day (TID) | RESPIRATORY_TRACT | Status: DC
  Administered 2015-10-16 – 2015-10-18 (×7): 3 mL via RESPIRATORY_TRACT
  Filled 2015-10-15 (×7): qty 3

## 2015-10-15 MED ORDER — HYDROCOD POLST-CPM POLST ER 10-8 MG/5ML PO SUER
5.0000 mL | Freq: Every evening | ORAL | Status: DC | PRN
  Administered 2015-10-16 – 2015-10-17 (×3): 5 mL via ORAL
  Filled 2015-10-15 (×3): qty 5

## 2015-10-15 MED ORDER — INSULIN ASPART 100 UNIT/ML ~~LOC~~ SOLN
7.0000 [IU] | Freq: Three times a day (TID) | SUBCUTANEOUS | Status: DC
  Administered 2015-10-15 – 2015-10-18 (×9): 7 [IU] via SUBCUTANEOUS

## 2015-10-15 NOTE — Progress Notes (Addendum)
Occupational Therapy Treatment Patient Details Name: [name] MRN: [id number] DOB: 09-Feb-1961 Today's Date: 10/15/2015    History of present illness [name] a medically-complex 55 y.o. female with PMH of pulmonary sarcoidosis on prednisone, insulin-dependent DM, HTN, and COPD not on home O2 who now presents to the ED with progressively worsening dyspnea with coughing fits.   OT comments  Education provided in session. Pt verbalized understanding of information OT talked about. OT signing off.  Follow Up Recommendations  Supervision - Intermittent;No OT follow up    Equipment Recommendations  None recommended by OT    Recommendations for Other Services      Precautions / Restrictions Restrictions Weight Bearing Restrictions: No       Mobility Bed Mobility                  Transfers Overall transfer level: Modified independent                    Balance    Supervision for simulated tub transfer.                               ADL Overall ADL's : Needs assistance/impaired     Grooming: Oral care;Wash/dry face;Set up;Standing               Lower Body Dressing: Set up;Sitting/lateral leans Lower Body Dressing Details (indicate cue type and reason): donned socks Toilet Transfer: Supervision/safety;Ambulation (sit to stand from chair)       Tub/ Shower Transfer: Tub transfer;Supervision/safety;Ambulation Tub/Shower Transfer Details (indicate cue type and reason): practiced stepping over simulated tub Functional mobility during ADLs: Supervision/safety (cues to take breaks) General ADL Comments: Educated on energy conservation techniques and breathing technique. Discussed using 3 in 1 as shower chair or chair she has at home. Educated on safety. recommended spouse be with her for tub transfer.      Vision                     Perception     Praxis      Cognition  Awake/Alert Behavior During Therapy: WFL for tasks  assessed/performed Overall Cognitive Status: Within Functional Limits for tasks assessed                       Extremity/Trunk Assessment               Exercises     Shoulder Instructions       General Comments      Pertinent Vitals/ Pain       Pain Assessment: No/denies pain; O2 appeared to remain in 90s during session with pt on RA.  Home Living                                          Prior Functioning/Environment              Frequency Min 1X/week     Progress Toward Goals  OT Goals(current goals can now be found in the care plan section)  Progress towards OT goals: Progressing toward goals-adequate for d/c  Acute Rehab OT Goals Patient Stated Goal: not stated OT Goal Formulation: With patient Time For Goal Achievement: 10/21/15 Potential to Achieve Goals: Good ADL Goals Pt Will Perform Tub/Shower Transfer: Tub transfer;Independently;ambulating Additional  ADL Goal #1: Pt will independently incorporate 1 energy conservation strategy into ADL.  Plan Discharge plan remains appropriate    Co-evaluation                 End of Session     Activity Tolerance Patient tolerated treatment well   Patient Left in bed;with family/visitor present   Nurse Communication          Time: 1214-1229 OT Time Calculation (min): 15 min  Charges: OT General Charges $OT Visit: 1 Procedure OT Treatments $Self Care/Home Management : 8-22 mins  Benito Mccreedy OTR/L [id number] 10/15/2015, 12:37 PM

## 2015-10-15 NOTE — Progress Notes (Signed)
[facility] Progress Note   [name]  [id number]  DOB: 1960/11/23  DOA: 10/13/2015 PCP: Garret Reddish, MD  Brief narrative: [name] is a 55 y.o. female PMH of pulmonary sarcoidosis on prednisone, insulin-dependent DM, HTN, and COPD who presents for cough and shortness of breath. She has had the cough for about 1 wk now.    Subjective: Not feeling well yet  Assessment/Plan:    COPD exacerbation/acute bronchitis -not requiring O2 -cont steroids, Nebs, Dulera and Levaquin  Lactic acidosis - mild- improved  Mild elevation in troponin - resolved    Sarcoidosis of lung - dose of steroids increased from baseline for COPD    Essential hypertension -cont Cozaar and Diltiazem-  Coreg has been held- will resume    Type II diabetes mellitus with neurological manifestations, uncontrolled  - on Toujeo at home- cont Lantus and Novolog    Chronic diastolic heart failure, NYHA class 2       Antibiotics: Anti-infectives    Start     Dose/Rate Route Frequency Ordered Stop   10/16/15 1000  azithromycin (ZITHROMAX) tablet 250 mg  Status:  Discontinued     250 mg Oral Daily 10/14/15 1453 10/15/15 1309   10/15/15 1000  azithromycin (ZITHROMAX) tablet 500 mg     500 mg Oral Daily 10/14/15 1453 10/15/15 0935   10/14/15 0130  levofloxacin (LEVAQUIN) IVPB 500 mg     500 mg 100 mL/hr over 60 Minutes Intravenous Daily at bedtime 10/14/15 0047 10/20/15 2159     Code Status:     Code Status Orders        Start     Ordered   10/14/15 0012  Full code   Continuous     10/14/15 0013     Family Communication:   Disposition Plan: home in 1-2 days DVT prophylaxis: Lovenox Consultants:  Procedures:     Objective: Filed Weights   10/14/15 0336 10/15/15 0526  Weight: 115.5 kg (254 lb 10.1 oz) 116.7 kg (257 lb 4.4 oz)    Intake/Output Summary (Last 24 hours) at 10/15/15 1550 Last data filed at 10/15/15 1535  Gross per 24 hour  Intake 1456.25 ml   Output   3300 ml  Net -1843.75 ml     Vitals Filed Vitals:   10/15/15 0526 10/15/15 0729 10/15/15 0935 10/15/15 1324  BP: 147/71  143/63   Pulse: 73  87   Temp: 97.9 F (36.6 C)     TempSrc: Oral     Resp: 16     Height:      Weight: 116.7 kg (257 lb 4.4 oz)     SpO2: 98% 98%  98%    Exam:  General:  [name] alert, not in acute distress  HEENT: No icterus, No thrush, oral mucosa moist  Cardiovascular: regular rate and rhythm, S1/S2 No murmur  Respiratory: tight, no wheezing  Abdomen: Soft, +Bowel sounds, non tender, non distended, no guarding  MSK: No cyanosis or clubbing- no pedal edema   Data Reviewed: Basic Metabolic Panel:  Recent Labs Lab 10/13/15 2143 10/14/15 0545  NA 138 135  K 4.1 4.2  CL 98* 98*  CO2 28 26  GLUCOSE 287* 283*  BUN 25* 24*  CREATININE 1.08* 1.15*  CALCIUM 9.3 8.6*   Liver Function Tests: No results for input(s): AST, ALT, ALKPHOS, BILITOT, PROT, ALBUMIN in the last 168 hours. No results for input(s): LIPASE, AMYLASE in the last 168 hours. No results for input(s): AMMONIA in the  last 168 hours. CBC:  Recent Labs Lab 10/13/15 2143 10/14/15 0545  WBC 17.0* 14.7*  NEUTROABS  --  12.9*  HGB 10.2* 10.1*  HCT 31.5* 31.2*  MCV 89.7 88.9  PLT 433* 390   Cardiac Enzymes:  Recent Labs Lab 10/14/15 0049 10/14/15 0545 10/14/15 1153 10/15/15 1410  TROPONINI <0.03 <0.03 0.04* <0.03   BNP (last 3 results)  Recent Labs  10/14/15 0049  BNP 39.7    ProBNP (last 3 results) No results for input(s): PROBNP in the last 8760 hours.  CBG:  Recent Labs Lab 10/14/15 1140 10/14/15 1701 10/14/15 2330 10/15/15 0743 10/15/15 1152  GLUCAP 398* 393* 355* 189* 384*    Recent Results (from the past 240 hour(s))  Culture, sputum-assessment     Status: None   Collection Time: 10/14/15  9:23 AM  Result Value Ref Range Status   Specimen Description SPUTUM  Final   Special Requests Normal  Final   Sputum evaluation   Final     THIS SPECIMEN IS ACCEPTABLE. RESPIRATORY CULTURE REPORT TO FOLLOW.   Report Status 10/14/2015 FINAL  Final  Culture, respiratory (NON-Expectorated)     Status: None (Preliminary result)   Collection Time: 10/14/15  9:23 AM  Result Value Ref Range Status   Specimen Description SPUTUM  Final   Special Requests NONE  Final   Gram Stain   Final    MODERATE WBC PRESENT,BOTH PMN AND MONONUCLEAR FEW SQUAMOUS EPITHELIAL CELLS PRESENT ABUNDANT GRAM POSITIVE COCCI IN PAIRS IN CLUSTERS Performed at [facility]    Culture   Final    Culture reincubated for better growth Performed at [facility]    Report Status PENDING  Incomplete     Studies: Dg Chest 2 View  10/13/2015  CLINICAL DATA:  Acute onset of shortness of breath and generalized chest pain. Initial encounter. EXAM: CHEST  2 VIEW COMPARISON:  Chest radiograph performed 05/07/2015 FINDINGS: The lungs are hypoexpanded. Mild bibasilar atelectasis is noted. Mild vascular crowding and vascular congestion are noted. No pleural effusion or pneumothorax is seen. The heart is borderline normal in size. No acute osseous abnormalities are seen. Scattered clips are noted about the gastroesophageal junction. IMPRESSION: Lungs hypoexpanded. Mild bibasilar atelectasis noted. Mild vascular congestion seen. Electronically Signed   By: Garald Balding M.D.   On: 10/13/2015 21:50    Scheduled Meds:  Scheduled Meds: . allopurinol  100 mg Oral QHS  . aspirin EC  81 mg Oral Daily  . atorvastatin  40 mg Oral q1800  . buPROPion  300 mg Oral Daily  . carvedilol  25 mg Oral BID WC  . cholecalciferol  5,000 Units Oral q morning - 10a  . Difluprednate  1 drop Ophthalmic Q2H while awake  . diltiazem  120 mg Oral Daily  . enoxaparin (LOVENOX) injection  55 mg Subcutaneous QHS  . estradiol  2 mg Oral QHS  . feeding supplement (ENSURE ENLIVE)  237 mL Oral BID BM  . fenofibrate  160 mg Oral Daily  . fluticasone  2 spray Each Nare Daily  .  gabapentin  100 mg Oral TID  . guaiFENesin  600 mg Oral BID  . insulin aspart  0-20 Units Subcutaneous TID WC  . insulin glargine  40 Units Subcutaneous QHS  . ipratropium-albuterol  3 mL Nebulization Q6H  . levofloxacin (LEVAQUIN) IV  500 mg Intravenous QHS  . losartan  100 mg Oral Daily  . methylPREDNISolone (SOLU-MEDROL) injection  40 mg Intravenous Q12H  .  mometasone-formoterol  2 puff Inhalation BID  . pantoprazole  40 mg Oral Daily  . PARoxetine  10 mg Oral Daily  . potassium chloride SA  20 mEq Oral BID  . sodium chloride  3 mL Intravenous Q12H  . sodium chloride  3 mL Intravenous Q12H  . venlafaxine XR  75 mg Oral BID  . vitamin B-12  1,000 mcg Oral Daily   Continuous Infusions:    Time spent on care of this patient: 25 min   Tyler, DO  10/15/2015, 3:50 PM  LOS: 2 days   [facility] Office  [phone] Pager - Text Page per [email or web address] If 7PM-7AM, please contact night-coverage [email or web address]

## 2015-10-16 ENCOUNTER — Ambulatory Visit: Payer: BLUE CROSS/BLUE SHIELD | Admitting: Infectious Disease

## 2015-10-16 ENCOUNTER — Encounter: Payer: Self-pay | Admitting: Infectious Disease

## 2015-10-16 LAB — CULTURE, RESPIRATORY W GRAM STAIN: Culture: NORMAL

## 2015-10-16 LAB — BASIC METABOLIC PANEL
Anion gap: 8 (ref 5–15)
BUN: 24 mg/dL — ABNORMAL HIGH (ref 6–20)
CO2: 28 mmol/L (ref 22–32)
Calcium: 8.9 mg/dL (ref 8.9–10.3)
Chloride: 99 mmol/L — ABNORMAL LOW (ref 101–111)
Creatinine, Ser: 0.89 mg/dL (ref 0.44–1.00)
GFR calc Af Amer: >60 mL/min (ref >60)
GFR calc non Af Amer: >60 mL/min (ref >60)
Glucose, Bld: 240 mg/dL — ABNORMAL HIGH (ref 65–99)
Potassium: 4.4 mmol/L (ref 3.5–5.1)
Sodium: 135 mmol/L (ref 135–145)

## 2015-10-16 LAB — GLUCOSE, CAPILLARY
Glucose-Capillary: 193 mg/dL — ABNORMAL HIGH (ref 65–99)
Glucose-Capillary: 336 mg/dL — ABNORMAL HIGH (ref 65–99)
Glucose-Capillary: 340 mg/dL — ABNORMAL HIGH (ref 65–99)
Glucose-Capillary: 357 mg/dL — ABNORMAL HIGH (ref 65–99)

## 2015-10-16 LAB — CBC
HCT: 28.1 % — ABNORMAL LOW (ref 36.0–46.0)
Hemoglobin: 9 g/dL — ABNORMAL LOW (ref 12.0–15.0)
MCH: 28.6 pg (ref 26.0–34.0)
MCHC: 32 g/dL (ref 30.0–36.0)
MCV: 89.2 fL (ref 78.0–100.0)
Platelets: 361 10*3/uL (ref 150–400)
RBC: 3.15 MIL/uL — ABNORMAL LOW (ref 3.87–5.11)
RDW: 13.3 % (ref 11.5–15.5)
WBC: 14.9 10*3/uL — ABNORMAL HIGH (ref 4.0–10.5)

## 2015-10-16 NOTE — Progress Notes (Signed)
[facility] Progress Note   [name]  [id number]  DOB: Jan 25, 1961  DOA: 10/13/2015 PCP: Garret Reddish, MD  Brief narrative: [name] is a 55 y.o. female PMH of pulmonary sarcoidosis on prednisone, insulin-dependent DM, HTN, and COPD who presents for cough and shortness of breath. She has had the cough for about 1 wk now.    Subjective: Feeling slightly better today  Assessment/Plan:   COPD exacerbation/acute bronchitis -not requiring O2 -cont steroids, Nebs, Dulera and Levaquin -will plan to start weaning steroids in AM  Lactic acidosis - mild- improved  Mild elevation in troponin - resolved    Sarcoidosis of lung - dose of steroids increased from baseline for COPD    Essential hypertension -cont Cozaar and Diltiazem-  Coreg has been held- will resume    Type II diabetes mellitus with neurological manifestations, uncontrolled  - on Toujeo at home- cont Lantus and Novolog    Chronic diastolic heart failure, NYHA class 2       Antibiotics: Anti-infectives    Start     Dose/Rate Route Frequency Ordered Stop   10/16/15 1000  azithromycin (ZITHROMAX) tablet 250 mg  Status:  Discontinued     250 mg Oral Daily 10/14/15 1453 10/15/15 1309   10/15/15 1000  azithromycin (ZITHROMAX) tablet 500 mg     500 mg Oral Daily 10/14/15 1453 10/15/15 0935   10/14/15 0130  levofloxacin (LEVAQUIN) IVPB 500 mg     500 mg 100 mL/hr over 60 Minutes Intravenous Daily at bedtime 10/14/15 0047 10/20/15 2159     Code Status:     Code Status Orders        Start     Ordered   10/14/15 0012  Full code   Continuous     10/14/15 0013     Family Communication:   Disposition Plan: home in 1-2 days DVT prophylaxis: Lovenox Consultants:  Procedures:     Objective: Filed Weights   10/14/15 0336 10/15/15 0526 10/16/15 0538  Weight: 115.5 kg (254 lb 10.1 oz) 116.7 kg (257 lb 4.4 oz) 117.2 kg (258 lb 6.1 oz)    Intake/Output Summary (Last 24 hours) at  10/16/15 1353 Last data filed at 10/16/15 0759  Gross per 24 hour  Intake    440 ml  Output   2250 ml  Net  -1810 ml     Vitals Filed Vitals:   10/15/15 2123 10/16/15 0538 10/16/15 0849 10/16/15 0959  BP: 137/53 158/67 148/92   Pulse: 61 70 67 68  Temp: 97.7 F (36.5 C) 97.9 F (36.6 C)    TempSrc: Oral Oral    Resp: 18 18  18   Height:      Weight:  117.2 kg (258 lb 6.1 oz)    SpO2: 100% 97%  96%    Exam:  General:  [name] alert, not in acute distress  Cardiovascular: regular rate and rhythm, S1/S2 No murmur  Respiratory: moving more air today, no wheezing  Abdomen: Soft, +Bowel sounds, non tender, non distended, no guarding  MSK: No cyanosis or clubbing- no pedal edema   Data Reviewed: Basic Metabolic Panel:  Recent Labs Lab 10/13/15 2143 10/14/15 0545 10/16/15 0647  NA 138 135 135  K 4.1 4.2 4.4  CL 98* 98* 99*  CO2 28 26 28   GLUCOSE 287* 283* 240*  BUN 25* 24* 24*  CREATININE 1.08* 1.15* 0.89  CALCIUM 9.3 8.6* 8.9   Liver Function Tests: No results for input(s): AST, ALT, ALKPHOS,  BILITOT, PROT, ALBUMIN in the last 168 hours. No results for input(s): LIPASE, AMYLASE in the last 168 hours. No results for input(s): AMMONIA in the last 168 hours. CBC:  Recent Labs Lab 10/13/15 2143 10/14/15 0545 10/16/15 0647  WBC 17.0* 14.7* 14.9*  NEUTROABS  --  12.9*  --   HGB 10.2* 10.1* 9.0*  HCT 31.5* 31.2* 28.1*  MCV 89.7 88.9 89.2  PLT 433* 390 361   Cardiac Enzymes:  Recent Labs Lab 10/14/15 0049 10/14/15 0545 10/14/15 1153 10/15/15 1410  TROPONINI <0.03 <0.03 0.04* <0.03   BNP (last 3 results)  Recent Labs  10/14/15 0049  BNP 39.7    ProBNP (last 3 results) No results for input(s): PROBNP in the last 8760 hours.  CBG:  Recent Labs Lab 10/15/15 1152 10/15/15 1626 10/15/15 2120 10/16/15 0757 10/16/15 1144  GLUCAP 384* 253* 345* 193* 336*    Recent Results (from the past 240 hour(s))  Culture, sputum-assessment     Status:  None   Collection Time: 10/14/15  9:23 AM  Result Value Ref Range Status   Specimen Description SPUTUM  Final   Special Requests Normal  Final   Sputum evaluation   Final    THIS SPECIMEN IS ACCEPTABLE. RESPIRATORY CULTURE REPORT TO FOLLOW.   Report Status 10/14/2015 FINAL  Final  Culture, respiratory (NON-Expectorated)     Status: None   Collection Time: 10/14/15  9:23 AM  Result Value Ref Range Status   Specimen Description SPUTUM  Final   Special Requests NONE  Final   Gram Stain   Final    MODERATE WBC PRESENT,BOTH PMN AND MONONUCLEAR FEW SQUAMOUS EPITHELIAL CELLS PRESENT ABUNDANT GRAM POSITIVE COCCI IN PAIRS IN CLUSTERS Performed at [facility]    Culture   Final    NORMAL OROPHARYNGEAL FLORA Performed at [facility]    Report Status 10/16/2015 FINAL  Final     Studies: No results found.  Scheduled Meds:  Scheduled Meds: . allopurinol  100 mg Oral QHS  . aspirin EC  81 mg Oral Daily  . atorvastatin  40 mg Oral q1800  . buPROPion  300 mg Oral Daily  . carvedilol  25 mg Oral BID WC  . cholecalciferol  5,000 Units Oral q morning - 10a  . Difluprednate  1 drop Ophthalmic Q2H while awake  . diltiazem  120 mg Oral Daily  . enoxaparin (LOVENOX) injection  55 mg Subcutaneous QHS  . estradiol  2 mg Oral QHS  . feeding supplement (ENSURE ENLIVE)  237 mL Oral BID BM  . fenofibrate  160 mg Oral Daily  . fluticasone  2 spray Each Nare Daily  . gabapentin  100 mg Oral TID  . guaiFENesin  600 mg Oral BID  . insulin aspart  0-20 Units Subcutaneous TID WC  . insulin aspart  7 Units Subcutaneous TID WC  . insulin glargine  40 Units Subcutaneous QHS  . ipratropium-albuterol  3 mL Nebulization TID  . levofloxacin (LEVAQUIN) IV  500 mg Intravenous QHS  . losartan  100 mg Oral Daily  . methylPREDNISolone (SOLU-MEDROL) injection  40 mg Intravenous Q12H  . mometasone-formoterol  2 puff Inhalation BID  . pantoprazole  40 mg Oral Daily  . PARoxetine  10 mg Oral  Daily  . potassium chloride SA  20 mEq Oral BID  . sodium chloride  3 mL Intravenous Q12H  . sodium chloride  3 mL Intravenous Q12H  . venlafaxine XR  75 mg Oral BID  .  vitamin B-12  1,000 mcg Oral Daily   Continuous Infusions:    Time spent on care of this patient: 25 min   Payne, DO  10/16/2015, 1:53 PM  LOS: 3 days   [facility] Office  [phone] Pager - Text Page per [email or web address] If 7PM-7AM, please contact night-coverage [email or web address]

## 2015-10-16 NOTE — Progress Notes (Signed)
Results for JERELINE, SURA (MRN [id number]) as of 10/16/2015 14:17  Ref. Range 10/15/2015 16:26 10/15/2015 21:20 10/16/2015 06:47 10/16/2015 07:57 10/16/2015 11:44  Glucose-Capillary Latest Ref Range: 65-99 mg/dL 253 (H) 345 (H)  193 (H) 336 (H)  Noted that postprandial blood sugars are greater than 180 mg/dl.  Recommend increasing Novolog meal coverage to 9-10 units TID if CBGs continue to be elevated and while on steroids.  Patient does take Novolog 7-12 units TID at home with meals along with Toujeo 40 units daily. Will continue to monitor blood sugars while in the hospital. Harvel Ricks RN BSN CDE

## 2015-10-17 LAB — GLUCOSE, CAPILLARY
Glucose-Capillary: 202 mg/dL — ABNORMAL HIGH (ref 65–99)
Glucose-Capillary: 340 mg/dL — ABNORMAL HIGH (ref 65–99)
Glucose-Capillary: 307 mg/dL — ABNORMAL HIGH (ref 65–99)
Glucose-Capillary: 289 mg/dL — ABNORMAL HIGH (ref 65–99)

## 2015-10-17 MED ORDER — PREDNISONE 50 MG PO TABS
60.0000 mg | ORAL_TABLET | Freq: Every day | ORAL | Status: DC
  Administered 2015-10-18: 60 mg via ORAL
  Filled 2015-10-17: qty 1

## 2015-10-17 MED ORDER — INSULIN GLARGINE 100 UNIT/ML ~~LOC~~ SOLN
45.0000 [IU] | Freq: Every day | SUBCUTANEOUS | Status: DC
  Administered 2015-10-17: 45 [IU] via SUBCUTANEOUS
  Filled 2015-10-17 (×3): qty 0.45

## 2015-10-17 MED ORDER — DIFLUPREDNATE 0.05 % OP EMUL
1.0000 [drp] | OPHTHALMIC | Status: DC
  Administered 2015-10-17 – 2015-10-18 (×4): 1 [drp] via OPHTHALMIC
  Filled 2015-10-17: qty 5

## 2015-10-17 NOTE — Progress Notes (Signed)
Results for CING, KUNDA (MRN [id number]) as of 10/17/2015 13:09  Ref. Range 10/16/2015 11:44 10/16/2015 16:30 10/16/2015 21:35 10/17/2015 07:58 10/17/2015 12:03  Glucose-Capillary Latest Ref Range: 65-99 mg/dL 336 (H) 340 (H) 357 (H) 202 (H) 340 (H)  Noted that blood sugars continue to be greater than 200 mg/dl. Recommend increasing Lantus to 45 units daily and may need to increase Novolog meal coverage to 9-10 units TID if CBGs continue to be elevated and while on steroids. Harvel Ricks RN BSN CDE

## 2015-10-17 NOTE — Progress Notes (Signed)
[facility] Progress Note   [name]  [id number]  DOB: [birth year]  DOA: 10/13/2015 PCP: Garret Reddish, MD  Brief narrative: [name] is a 55 y.o. female PMH of pulmonary sarcoidosis on prednisone, insulin-dependent DM, HTN, and COPD who presents for cough and shortness of breath. She has had the cough for about 1 wk now.    Subjective: Breathing improved, able to walk around unit  Assessment/Plan:   COPD exacerbation/acute bronchitis -not requiring O2 - Nebs, Dulera and Levaquin -change to PO steroids in AM-- wean to home dose  Lactic acidosis - mild- improved  Mild elevation in troponin - resolved    Sarcoidosis of lung - dose of steroids increased from baseline for COPD    Essential hypertension -cont Cozaar and Diltiazem-  Coreg has been held- will resume    Type II diabetes mellitus with neurological manifestations, uncontrolled  - on Toujeo at home- cont Lantus and Novolog    Chronic diastolic heart failure, NYHA class 2       Antibiotics: Anti-infectives    Start     Dose/Rate Route Frequency Ordered Stop   10/16/15 1000  azithromycin (ZITHROMAX) tablet 250 mg  Status:  Discontinued     250 mg Oral Daily 10/14/15 1453 10/15/15 1309   10/15/15 1000  azithromycin (ZITHROMAX) tablet 500 mg     500 mg Oral Daily 10/14/15 1453 10/15/15 0935   10/14/15 0130  levofloxacin (LEVAQUIN) IVPB 500 mg     500 mg 100 mL/hr over 60 Minutes Intravenous Daily at bedtime 10/14/15 0047 10/20/15 2159     Code Status:     Code Status Orders        Start     Ordered   10/14/15 0012  Full code   Continuous     10/14/15 0013     Family Communication:   Disposition Plan: home in AM? DVT prophylaxis: Lovenox Consultants:  Procedures:     Objective: Filed Weights   10/15/15 0526 10/16/15 0538 10/17/15 0502  Weight: 116.7 kg (257 lb 4.4 oz) 117.2 kg (258 lb 6.1 oz) 119.6 kg (263 lb 10.7 oz)    Intake/Output Summary (Last 24 hours) at  10/17/15 1355 Last data filed at 10/17/15 0944  Gross per 24 hour  Intake    800 ml  Output   4150 ml  Net  -3350 ml     Vitals Filed Vitals:   10/17/15 0502 10/17/15 0505 10/17/15 0759 10/17/15 0838  BP:  142/78  170/78  Pulse:  61  60  Temp:  97.7 F (36.5 C)    TempSrc:  Oral    Resp:  18    Height:      Weight: 119.6 kg (263 lb 10.7 oz)     SpO2:  91% 95%     Exam:  General:  [name] alert, not in acute distress  Cardiovascular: regular rate and rhythm, S1/S2 No murmur  Respiratory: moving more air today, no wheezing  Abdomen: Soft, +Bowel sounds, non tender, non distended, no guarding  MSK: No cyanosis or clubbing- no pedal edema   Data Reviewed: Basic Metabolic Panel:  Recent Labs Lab 10/13/15 2143 10/14/15 0545 10/16/15 0647  NA 138 135 135  K 4.1 4.2 4.4  CL 98* 98* 99*  CO2 28 26 28   GLUCOSE 287* 283* 240*  BUN 25* 24* 24*  CREATININE 1.08* 1.15* 0.89  CALCIUM 9.3 8.6* 8.9   Liver Function Tests: No results for input(s): AST, ALT, ALKPHOS,  BILITOT, PROT, ALBUMIN in the last 168 hours. No results for input(s): LIPASE, AMYLASE in the last 168 hours. No results for input(s): AMMONIA in the last 168 hours. CBC:  Recent Labs Lab 10/13/15 2143 10/14/15 0545 10/16/15 0647  WBC 17.0* 14.7* 14.9*  NEUTROABS  --  12.9*  --   HGB 10.2* 10.1* 9.0*  HCT 31.5* 31.2* 28.1*  MCV 89.7 88.9 89.2  PLT 433* 390 361   Cardiac Enzymes:  Recent Labs Lab 10/14/15 0049 10/14/15 0545 10/14/15 1153 10/15/15 1410  TROPONINI <0.03 <0.03 0.04* <0.03   BNP (last 3 results)  Recent Labs  10/14/15 0049  BNP 39.7    ProBNP (last 3 results) No results for input(s): PROBNP in the last 8760 hours.  CBG:  Recent Labs Lab 10/16/15 1144 10/16/15 1630 10/16/15 2135 10/17/15 0758 10/17/15 1203  GLUCAP 336* 340* 357* 202* 340*    Recent Results (from the past 240 hour(s))  Culture, sputum-assessment     Status: None   Collection Time: 10/14/15   9:23 AM  Result Value Ref Range Status   Specimen Description SPUTUM  Final   Special Requests Normal  Final   Sputum evaluation   Final    THIS SPECIMEN IS ACCEPTABLE. RESPIRATORY CULTURE REPORT TO FOLLOW.   Report Status 10/14/2015 FINAL  Final  Culture, respiratory (NON-Expectorated)     Status: None   Collection Time: 10/14/15  9:23 AM  Result Value Ref Range Status   Specimen Description SPUTUM  Final   Special Requests NONE  Final   Gram Stain   Final    MODERATE WBC PRESENT,BOTH PMN AND MONONUCLEAR FEW SQUAMOUS EPITHELIAL CELLS PRESENT ABUNDANT GRAM POSITIVE COCCI IN PAIRS IN CLUSTERS Performed at [facility]    Culture   Final    NORMAL OROPHARYNGEAL FLORA Performed at [facility]    Report Status 10/16/2015 FINAL  Final     Studies: No results found.  Scheduled Meds:  Scheduled Meds: . allopurinol  100 mg Oral QHS  . aspirin EC  81 mg Oral Daily  . atorvastatin  40 mg Oral q1800  . buPROPion  300 mg Oral Daily  . carvedilol  25 mg Oral BID WC  . cholecalciferol  5,000 Units Oral q morning - 10a  . Difluprednate  1 drop Ophthalmic Q2H while awake  . diltiazem  120 mg Oral Daily  . enoxaparin (LOVENOX) injection  55 mg Subcutaneous QHS  . estradiol  2 mg Oral QHS  . feeding supplement (ENSURE ENLIVE)  237 mL Oral BID BM  . fenofibrate  160 mg Oral Daily  . fluticasone  2 spray Each Nare Daily  . gabapentin  100 mg Oral TID  . guaiFENesin  600 mg Oral BID  . insulin aspart  0-20 Units Subcutaneous TID WC  . insulin aspart  7 Units Subcutaneous TID WC  . insulin glargine  45 Units Subcutaneous QHS  . ipratropium-albuterol  3 mL Nebulization TID  . levofloxacin (LEVAQUIN) IV  500 mg Intravenous QHS  . losartan  100 mg Oral Daily  . mometasone-formoterol  2 puff Inhalation BID  . pantoprazole  40 mg Oral Daily  . PARoxetine  10 mg Oral Daily  . potassium chloride SA  20 mEq Oral BID  . [START ON 10/18/2015] predniSONE  60 mg Oral Q breakfast   . sodium chloride  3 mL Intravenous Q12H  . sodium chloride  3 mL Intravenous Q12H  . venlafaxine XR  75 mg Oral  BID  . vitamin B-12  1,000 mcg Oral Daily   Continuous Infusions:    Time spent on care of this patient: 25 min   [place], DO  10/17/2015, 1:55 PM  LOS: 4 days   [facility] Office  [phone] Pager - Text Page per [email or web address] If 7PM-7AM, please contact night-coverage [email or web address]

## 2015-10-17 NOTE — Progress Notes (Signed)
Spoke to patient about height of her and and bed alarm being set. She was upset that we put on the alarm and she likes her bed elevated high.  Patient verbalized she understand why we wanted to bed in the lowest position and the alarm on. But she disagreed and stated that she is alert and oriented and wasn't getting out of bed without assist. She refused the alarm to be set and bed to be in lowest position.

## 2015-10-18 ENCOUNTER — Telehealth: Payer: Self-pay | Admitting: Family Medicine

## 2015-10-18 DIAGNOSIS — Z794 Long term (current) use of insulin: Secondary | ICD-10-CM

## 2015-10-18 DIAGNOSIS — I10 Essential (primary) hypertension: Secondary | ICD-10-CM

## 2015-10-18 DIAGNOSIS — J441 Chronic obstructive pulmonary disease with (acute) exacerbation: Secondary | ICD-10-CM

## 2015-10-18 DIAGNOSIS — D860 Sarcoidosis of lung: Secondary | ICD-10-CM

## 2015-10-18 DIAGNOSIS — E1140 Type 2 diabetes mellitus with diabetic neuropathy, unspecified: Secondary | ICD-10-CM

## 2015-10-18 DIAGNOSIS — I5032 Chronic diastolic (congestive) heart failure: Secondary | ICD-10-CM

## 2015-10-18 DIAGNOSIS — E1165 Type 2 diabetes mellitus with hyperglycemia: Secondary | ICD-10-CM

## 2015-10-18 DIAGNOSIS — D508 Other iron deficiency anemias: Secondary | ICD-10-CM

## 2015-10-18 LAB — GLUCOSE, CAPILLARY
Glucose-Capillary: 111 mg/dL — ABNORMAL HIGH (ref 65–99)
Glucose-Capillary: 210 mg/dL — ABNORMAL HIGH (ref 65–99)

## 2015-10-18 MED ORDER — PREDNISONE 5 MG PO TABS: ORAL_TABLET | ORAL | Status: DC

## 2015-10-18 MED ORDER — MENTHOL 3 MG MT LOZG
1.0000 | LOZENGE | OROMUCOSAL | Status: DC | PRN
  Filled 2015-10-18: qty 9

## 2015-10-18 NOTE — Discharge Summary (Signed)
Physician Discharge Summary  [name] [id number] DOB: [birth year] DOA: 10/13/2015  PCP: Garret Reddish, MD  Admit date: 10/13/2015 Discharge date: 10/18/2015  Time spent: 40 minutes  Recommendations for Outpatient Follow-up:  1. Follow up with PCP in 1 week. 2. Prednisone taper, she take 5 mg of prednisone at home chronically.   Discharge Diagnoses:  Principal Problem:   COPD exacerbation ([place]) Active Problems:   Sarcoidosis of lung ([place])   Essential hypertension   Type II diabetes mellitus with neurological manifestations, uncontrolled ([place])   Chronic diastolic heart failure, NYHA class 2 ([facility])   Anemia   Discharge Condition: Stable  Diet recommendation: Heart healthy  Filed Weights   10/16/15 0538 10/17/15 0502 10/18/15 0348  Weight: 117.2 kg (258 lb 6.1 oz) 119.6 kg (263 lb 10.7 oz) 119.7 kg (263 lb 14.3 oz)    History of present illness:  [name] is a medically-complex 55 y.o. female with PMH of pulmonary sarcoidosis on prednisone, insulin-dependent DM, HTN, and COPD not on home O2 who now presents to the ED with progressively worsening dyspnea with coughing fits. She reports being in her usual state until 6 days ago when she developed a non-productive cough and dyspnea on minimal exertion. She saw her PCP for these complaints, was diagnosed with acute URI, and given recommendations for symptom management. Dyspnea is constant, worse with any exertion and better with rest. Home albuterol nebs did not seem to help. She denies fever, chills, rhinorrhea, sore throat, or sick contacts. She denies chest pain, palpitations, edema, or orthopnea. She saw her pulmonologist in the clinic last month and per the notes, seemed to be stable from respiratory perspective with no recent flares in sarcoid. She had been on high-dose steroids previously, but weaned down to her current 5mg  daily.   In ED, patient was found to be afebrile, saturating adequately on room air, but  with obvious respiratory distress in the form of tachypnea and increased work of breathing. Chest x-ray demonstrated hypoexpansion with bibasilar atelectasis and mild vascular congestion. Initial labs revealed a leukocytosis to 17 thousand, stable chronic anemia, stable CKD 2, and hyperglycemia with serum glucose of 287. She was given nebulized breathing treatments x2 and a 125 mg IV push of Solu-Medrol in the ED, but continued to be obstructed with significantly increased work of breathing. The hospitalists were asked to admit.   Hospital Course:   COPD exacerbation/acute bronchitis -Presented with cough, subjective fever, sputum production and SOB. -Patient is on chronic prednisone at home, started on IV steroids and antibiotics. -Continue supportive management with bronchodilators, mucolytics, antitussives and oxygen as needed. -Discharged home on prednisone taper given to continue the chronic 5 mg of prednisone. -I did not feel it was necessary to discharge patient on his buttocks, he already had 5 days of Levaquin in the hospital.  Lactic acidosis -Mild lactic acid elevation of 2.3, this is resolved after hydration with IV fluids.  Mild elevation in troponin -Troponin reached a peak of 0.04, likely secondary to COPD exacerbation and stress, not ACS pattern.  Sarcoidosis of lung -On chronic steroids at home, steroids does increase from baseline for COPD exacerbation.  Essential hypertension -Cont Cozaar and Diltiazem- Coreg has been held- will resume  Type II diabetes mellitus with neurological manifestations, uncontrolled  -On Toujeo at home- cont Lantus and Novolog  Chronic diastolic heart failure, NYHA class 2 -Continue home Lasix dose at 40 mg twice a day. -No symptoms or signs of decompensation.   Procedures:  None  Consultations:  None  Discharge Exam: Filed Vitals:   10/18/15 0600 10/18/15 0915  BP: 125/60   Pulse: 70 72  Temp: 98 F (36.7 C)   Resp: 18 18    General: Alert and awake, oriented x3, not in any acute distress. HEENT: anicteric sclera, pupils reactive to light and accommodation, EOMI [name]: S1-S2 clear, no murmur rubs or gallops Chest: clear to auscultation bilaterally, no wheezing, rales or rhonchi Abdomen: soft nontender, nondistended, normal bowel sounds, no organomegaly Extremities: no cyanosis, clubbing or edema noted bilaterally Neuro: Cranial nerves II-XII intact, no focal neurological deficits  Discharge Instructions   Discharge Instructions    Diet - low sodium heart healthy    Complete by:  As directed      Increase activity slowly    Complete by:  As directed           Current Discharge Medication List    START taking these medications   Details  predniSONE (DELTASONE) 5 MG tablet Take 8 tabs orally by mouth for 2 days, then take 6 tabs orally by mouth for 2 days, then take 4 tabs orally by mouth for 2 days, then take 2 tabs orally by mouth for 2 days, then continue your regular dose of 5 mg. Qty: 90 tablet, Refills: 0      CONTINUE these medications which have NOT CHANGED   Details  acetaminophen (TYLENOL) 325 MG tablet Take 650 mg by mouth every 6 (six) hours as needed for headache.    albuterol (PROVENTIL HFA;VENTOLIN HFA) 108 (90 BASE) MCG/ACT inhaler Inhale 1-2 puffs into the lungs every 6 (six) hours as needed for wheezing or shortness of breath. Qty: 8 g, Refills: 5    allopurinol (ZYLOPRIM) 100 MG tablet Take 100 mg by mouth at bedtime.     aspirin EC 81 MG tablet Take 81 mg by mouth daily.     atorvastatin (LIPITOR) 40 MG tablet TAKE ONE TABLET BY MOUTH ONE TIME DAILY Qty: 90 tablet, Refills: 3    buPROPion (WELLBUTRIN XL) 300 MG 24 hr tablet Take 1 tablet (300 mg total) by mouth daily. Qty: 90 tablet, Refills: 3    CARTIA XT 120 MG 24 hr capsule TAKE ONE CAPSULE BY MOUTH ONE TIME DAILY IN THE EVENING  Qty: 30 capsule, Refills: 11    carvedilol (COREG) 25 MG tablet Take 1 tablet (25 mg  total) by mouth 2 (two) times daily with a meal. MUST KEEP APPOINTMENT 11/20/2015 WITH DR HARDING FOR FUTURE REFILLS Qty: 60 tablet, Refills: 1    chlorpheniramine-HYDROcodone (TUSSIONEX) 10-8 MG/5ML SUER Take 5 mLs by mouth every 12 (twelve) hours as needed for cough. Qty: 120 mL, Refills: 0    cholecalciferol 5000 UNITS TABS Take 1 tablet (5,000 Units total) by mouth daily. Qty: 30 tablet, Refills: 3    DEXILANT 60 MG capsule TAKE ONE CAPSULE BY MOUTH ONE TIME DAILY Qty: 30 capsule, Refills: 6    estradiol (ESTRACE) 2 MG tablet Take 2 mg by mouth at bedtime.     fenofibrate (TRICOR) 145 MG tablet TAKE ONE TABLET BY MOUTH ONE TIME DAILY  Qty: 30 tablet, Refills: 10    fluticasone (FLONASE) 50 MCG/ACT nasal spray USE TWO SPRAYS IN EACH NOSTRIL DAILY Qty: 16 g, Refills: 4    !! furosemide (LASIX) 40 MG tablet Take 1 tablet (40 mg total) by mouth 2 (two) times daily. Qty: 60 tablet, Refills: 4    gabapentin (NEURONTIN) 100 MG capsule Take 1 capsule (100 mg  total) by mouth 3 (three) times daily. **PT NEEDS AN APPT FOR FURTHER REFILLS** Qty: 90 capsule, Refills: 1    HYDROcodone-acetaminophen (NORCO) 10-325 MG per tablet Take 1-2 tablets by mouth every 4 (four) hours as needed for moderate pain.  Refills: 0    !! insulin aspart (NOVOLOG FLEXPEN) 100 UNIT/ML FlexPen Inject 7-12 Units into the skin 3 (three) times daily with meals. Per sliding scale    Insulin Glargine (TOUJEO SOLOSTAR) 300 UNIT/ML SOPN Inject 40 Units into the skin at bedtime. Qty: 3 pen, Refills: 2    losartan (COZAAR) 50 MG tablet Take 1 tablet (50 mg total) by mouth daily. Qty: 90 tablet, Refills: 3    metFORMIN (GLUCOPHAGE) 1000 MG tablet TAKE ONE TABLET BY MOUTH TWICE DAILY with a meal. Qty: 180 tablet, Refills: 2    mometasone-formoterol (DULERA) 200-5 MCG/ACT AERO Inhale 2 puffs into the lungs 2 (two) times daily. Qty: 13 g, Refills: 5    nitroGLYCERIN (NITROSTAT) 0.4 MG SL tablet Place 0.4 mg under the  tongue every 5 (five) minutes as needed. x3 doses as needed for chest pain.    ondansetron (ZOFRAN-ODT) 4 MG disintegrating tablet Take 4 mg by mouth every 8 (eight) hours as needed for nausea or vomiting.  Refills: 0    PARoxetine Mesylate (BRISDELLE) 7.5 MG CAPS Take 7.5 mg by mouth daily.    potassium chloride SA (K-DUR,KLOR-CON) 20 MEQ tablet Take 1 tablet (20 mEq total) by mouth 3 (three) times daily. Qty: 90 tablet, Refills: 1    promethazine (PHENERGAN) 25 MG tablet Take 1 tablet (25 mg total) by mouth every 6 (six) hours as needed for nausea or vomiting. Qty: 15 tablet, Refills: 0    venlafaxine XR (EFFEXOR-XR) 75 MG 24 hr capsule Take 1 capsule (75 mg total) by mouth 2 (two) times daily. Qty: 180 capsule, Refills: 3    vitamin B-12 (CYANOCOBALAMIN) 1000 MCG tablet Take 1,000 mcg by mouth daily.    !! B-D ULTRAFINE III SHORT PEN 31G X 8 MM MISC FOLLOW DIRECTIONS PROVIDED BY PHYSICIAN Qty: 130 each, Refills: 2    !! furosemide (LASIX) 40 MG tablet Take 1 tablet (40 mg total) by mouth 2 (two) times daily. PATIENT NEEDS TO CONTACT OFFICE FOR ADDITIONAL REFILLS Qty: 30 tablet, Refills: 0    glucose blood (ONE TOUCH ULTRA TEST) test strip 1 each by Other route 3 (three) times daily. Use as instructed Qty: 100 each, Refills: 5    !! Insulin Pen Needle 31G X 8 MM MISC Use to inject insulin 4 times daily. Qty: 130 each, Refills: 2    !! NOVOLOG FLEXPEN 100 UNIT/ML FlexPen INJECT 7-12 UNITS INTO THE SKIN 3 TIMES DAILY WITH MEALS. Qty: 15 pen, Refills: 1     !! - Potential duplicate medications found. Please discuss with provider.    STOP taking these medications     diphenhydrAMINE (BENADRYL) 25 mg capsule        Allergies  Allergen Reactions  . Vancomycin Other (See Comments)    nephrotoxicity  . Chlorhexidine Itching  . Lisinopril Cough  . Methotrexate Other (See Comments)     peri-oral and buccal lesions.  . Clindamycin/Lincomycin Nausea And Vomiting and Rash  .  Doxycycline Rash  . Teflaro [Ceftaroline] Rash      The results of significant diagnostics from this hospitalization (including imaging, microbiology, ancillary and laboratory) are listed below for reference.    Significant Diagnostic Studies: Dg Chest 2 View  10/13/2015  CLINICAL DATA:  Acute onset of shortness of breath and generalized chest pain. Initial encounter. EXAM: CHEST  2 VIEW COMPARISON:  Chest radiograph performed 05/07/2015 FINDINGS: The lungs are hypoexpanded. Mild bibasilar atelectasis is noted. Mild vascular crowding and vascular congestion are noted. No pleural effusion or pneumothorax is seen. The heart is borderline normal in size. No acute osseous abnormalities are seen. Scattered clips are noted about the gastroesophageal junction. IMPRESSION: Lungs hypoexpanded. Mild bibasilar atelectasis noted. Mild vascular congestion seen. Electronically Signed   By: Garald Balding M.D.   On: 10/13/2015 21:50   Mr Heel Left W Wo Contrast  09/27/2015  CLINICAL DATA:  Persistent heel pain with erythema and swelling. History of heel surgery in June with subsequent infection requiring antibiotics an additional surgery. Evaluate for osteomyelitis. EXAM: MRI OF LOWER LEFT EXTREMITY WITHOUT AND WITH CONTRAST TECHNIQUE: Multiplanar, multisequence MR imaging of the left hindfoot was performed both before and after administration of intravenous contrast. CONTRAST:  20 ml MultiHance. COMPARISON:  MRI 06/03/2015. FINDINGS: There is stable deformity of the calcaneal tuberosity superiorly status post osteotomy. There is interval partial healing of the osteotomy with decreased T2 hyperintensity and enhancement along the the surgical margins. There is no progressive abnormal T2 signal, abnormal enhancement or cortical destruction to suggest osteomyelitis. The lateral hindfoot soft tissue wound/tract demonstrates slight interval contraction. There is associated soft tissue enhancement following contrast, but no  focal fluid collection. The posterior soft tissue enhancement has nearly resolved. There is some subcutaneous edema within the distal lower leg. Peroneal tenosynovitis has mildly worsened. Postsurgical susceptibility artifact again noted at the base of the fifth metatarsal. IMPRESSION: 1. Interval progressive healing of calcaneal osteotomy. No evidence of osteomyelitis. 2. The lateral hindfoot soft tissue wound/tract demonstrates mild contraction. No evidence of soft tissue abscess. 3. Peroneal tenosynovitis and subcutaneous edema in the distal lower leg have mildly worsened. Electronically Signed   By: Richardean Sale M.D.   On: 09/27/2015 17:00    Microbiology: Recent Results (from the past 240 hour(s))  Culture, sputum-assessment     Status: None   Collection Time: 10/14/15  9:23 AM  Result Value Ref Range Status   Specimen Description SPUTUM  Final   Special Requests Normal  Final   Sputum evaluation   Final    THIS SPECIMEN IS ACCEPTABLE. RESPIRATORY CULTURE REPORT TO FOLLOW.   Report Status 10/14/2015 FINAL  Final  Culture, respiratory (NON-Expectorated)     Status: None   Collection Time: 10/14/15  9:23 AM  Result Value Ref Range Status   Specimen Description SPUTUM  Final   Special Requests NONE  Final   Gram Stain   Final    MODERATE WBC PRESENT,BOTH PMN AND MONONUCLEAR FEW SQUAMOUS EPITHELIAL CELLS PRESENT ABUNDANT GRAM POSITIVE COCCI IN PAIRS IN CLUSTERS Performed at [facility]    Culture   Final    NORMAL OROPHARYNGEAL FLORA Performed at [facility]    Report Status 10/16/2015 FINAL  Final     Labs: Basic Metabolic Panel:  Recent Labs Lab 10/13/15 2143 10/14/15 0545 10/16/15 0647  NA 138 135 135  K 4.1 4.2 4.4  CL 98* 98* 99*  CO2 28 26 28   GLUCOSE 287* 283* 240*  BUN 25* 24* 24*  CREATININE 1.08* 1.15* 0.89  CALCIUM 9.3 8.6* 8.9   Liver Function Tests: No results for input(s): AST, ALT, ALKPHOS, BILITOT, PROT, ALBUMIN in the last 168  hours. No results for input(s): LIPASE, AMYLASE in the last 168 hours. No results for input(s):  AMMONIA in the last 168 hours. CBC:  Recent Labs Lab 10/13/15 2143 10/14/15 0545 10/16/15 0647  WBC 17.0* 14.7* 14.9*  NEUTROABS  --  12.9*  --   HGB 10.2* 10.1* 9.0*  HCT 31.5* 31.2* 28.1*  MCV 89.7 88.9 89.2  PLT 433* 390 361   Cardiac Enzymes:  Recent Labs Lab 10/14/15 0049 10/14/15 0545 10/14/15 1153 10/15/15 1410  TROPONINI <0.03 <0.03 0.04* <0.03   BNP: BNP (last 3 results)  Recent Labs  10/14/15 0049  BNP 39.7    ProBNP (last 3 results) No results for input(s): PROBNP in the last 8760 hours.  CBG:  Recent Labs Lab 10/17/15 0758 10/17/15 1203 10/17/15 1650 10/17/15 2116 10/18/15 0740  GLUCAP 202* 340* 307* 289* 111*       Signed:  Linah Klapper A MD   [facility] 10/18/2015, 11:24 AM

## 2015-10-18 NOTE — Discharge Instructions (Signed)
Chronic Obstructive Pulmonary Disease Exacerbation Chronic obstructive pulmonary disease (COPD) is a common lung condition in which airflow from the lungs is limited. COPD is a general term that can be used to describe many different lung problems that limit airflow, including chronic bronchitis and emphysema. COPD exacerbations are episodes when breathing symptoms become much worse and require extra treatment. Without treatment, COPD exacerbations can be life threatening, and frequent COPD exacerbations can cause further damage to your lungs. CAUSES  Respiratory infections.  Exposure to smoke.  Exposure to air pollution, chemical fumes, or dust. Sometimes there is no apparent cause or trigger. RISK FACTORS  Smoking cigarettes.  Older age.  Frequent prior COPD exacerbations. SIGNS AND SYMPTOMS  Increased coughing.  Increased thick spit (sputum) production.  Increased wheezing.  Increased shortness of breath.  Rapid breathing.  Chest tightness. DIAGNOSIS Your medical history, a physical exam, and tests will help your health care provider make a diagnosis. Tests may include:  A chest X-ray.  Basic lab tests.  Sputum testing.  An arterial blood gas test. TREATMENT Depending on the severity of your COPD exacerbation, you may need to be admitted to a hospital for treatment. Some of the treatments commonly used to treat COPD exacerbations are:   Antibiotic medicines.  Bronchodilators. These are drugs that expand the air passages. They may be given with an inhaler or nebulizer. Spacer devices may be needed to help improve drug delivery.  Corticosteroid medicines.  Supplemental oxygen therapy.  Airway clearing techniques, such as noninvasive ventilation (NIV) and positive expiratory pressure (PEP). These provide respiratory support through a mask or other noninvasive device. HOME CARE INSTRUCTIONS  Do not smoke. Quitting smoking is very important to prevent COPD from  getting worse and exacerbations from happening as often.  Avoid exposure to all substances that irritate the airway, especially to tobacco smoke.  If you were prescribed an antibiotic medicine, finish it all even if you start to feel better.  Take all medicines as directed by your health care provider.It is important to use correct technique with inhaled medicines.  Drink enough fluids to keep your urine clear or pale yellow (unless you have a medical condition that requires fluid restriction).  Use a cool mist vaporizer. This makes it easier to clear your chest when you cough.  If you have a home nebulizer and oxygen, continue to use them as directed.  Maintain all necessary vaccinations to prevent infections.  Exercise regularly.  Eat a healthy diet.  Keep all follow-up appointments as directed by your health care provider. SEEK IMMEDIATE MEDICAL CARE IF:  You have worsening shortness of breath.  You have trouble talking.  You have severe chest pain.  You have blood in your sputum.  You have a fever.  You have weakness, vomit repeatedly, or faint.  You feel confused.  You continue to get worse. MAKE SURE YOU:  Understand these instructions.  Will watch your condition.  Will get help right away if you are not doing well or get worse.   This information is not intended to replace advice given to you by your health care provider. Make sure you discuss any questions you have with your health care provider.   Document Released: 07/25/2007 Document Revised: 10/18/2014 Document Reviewed: 06/01/2013 Elsevier Interactive Patient Education 2016 [employer]  

## 2015-10-18 NOTE — Progress Notes (Signed)
Reviewed discharge instructions PIV removed.  Prescription given.  Denied any needs at this time.  Pt taken to discharge location via wheelchair.

## 2015-10-18 NOTE — Telephone Encounter (Signed)
[place] on call physician  Upon discharge, at home CBG was 430 before dinner. Took 12 units and around 10:30 PM was down to 328. Nurse told her to take 40 units of Toujeo as scheduled (last office note states 35 but patient believes she is to take 90). Apparently when sugar was over 400 she was feeling fatigued but feels better now. We discussed repeating in morning and continuing sliding scale. If symptomatic again or if >400 I am happy to touch base with patient but hesitant to give extra dose before she goes to bed tonight. Can reach out to Dr. Cruzita Lederer on Monday.

## 2015-10-20 ENCOUNTER — Other Ambulatory Visit: Payer: Self-pay | Admitting: *Deleted

## 2015-10-20 ENCOUNTER — Telehealth: Payer: Self-pay | Admitting: Internal Medicine

## 2015-10-20 MED ORDER — DILTIAZEM HCL ER COATED BEADS 120 MG PO CP24: 120.0000 mg | ORAL_CAPSULE | Freq: Every day | ORAL | Status: DC

## 2015-10-20 NOTE — Telephone Encounter (Signed)
L[place]e [place], can you please check on her today and find out what happened? Thank you!

## 2015-10-20 NOTE — Telephone Encounter (Signed)
Patient Name: [name] Gender: Female DOB: 1961-05-12 Age: 55 Y 2 M 18 D Return Phone Number: [phone] (Primary), [phone] (Secondary) Address: City/State/Zip: [place] Client [facility] Night - Client Client Site [facility] Physician Philemon Kingdom Contact Type Call Call Type Triage / [place] Name william Relationship To Patient Spouse Return Phone Number [phone] (Primary) Chief Complaint Blood Sugar High Initial Comment Caller states wife has URI. Her Blood sugar is 485; she was put on prednisone. PreDisposition Call Doctor Nurse Assessment Nurse: Amalia Hailey, RN, Melissa Date/Time Eilene Ghazi Time): 10/18/2015 10:48:19 PM Confirm and document reason for call. If symptomatic, describe symptoms. ---Caller states wife has URI. Her Blood sugar is 485; she was put on prednisone. Has the patient traveled out of the country within the last 30 days? ---Not Applicable

## 2015-10-20 NOTE — Telephone Encounter (Signed)
Called pt. She was in the hospital and she had an URI, she was on prednisone. Her sugars are decreasing. Advised her to keep a check on them and if they have not come down below 200 in a couple of days, to call our office. Pt voiced understanding. Be advised.

## 2015-10-20 NOTE — Telephone Encounter (Signed)
Noted, thank you

## 2015-10-21 ENCOUNTER — Other Ambulatory Visit: Payer: Self-pay | Admitting: *Deleted

## 2015-10-21 MED ORDER — DILTIAZEM HCL ER COATED BEADS 120 MG PO CP24: 120.0000 mg | ORAL_CAPSULE | Freq: Every day | ORAL | Status: DC

## 2015-10-27 ENCOUNTER — Encounter: Payer: Self-pay | Admitting: Infectious Disease

## 2015-10-27 ENCOUNTER — Ambulatory Visit (INDEPENDENT_AMBULATORY_CARE_PROVIDER_SITE_OTHER): Payer: BLUE CROSS/BLUE SHIELD | Admitting: Infectious Disease

## 2015-10-27 VITALS — BP 130/78 | HR 80 | Temp 97.9°F | Wt 252.0 lb

## 2015-10-27 DIAGNOSIS — E1169 Type 2 diabetes mellitus with other specified complication: Secondary | ICD-10-CM

## 2015-10-27 DIAGNOSIS — T847XXD Infection and inflammatory reaction due to other internal orthopedic prosthetic devices, implants and grafts, subsequent encounter: Secondary | ICD-10-CM | POA: Diagnosis not present

## 2015-10-27 DIAGNOSIS — B351 Tinea unguium: Secondary | ICD-10-CM

## 2015-10-27 DIAGNOSIS — E1141 Type 2 diabetes mellitus with diabetic mononeuropathy: Secondary | ICD-10-CM

## 2015-10-27 DIAGNOSIS — M86672 Other chronic osteomyelitis, left ankle and foot: Secondary | ICD-10-CM

## 2015-10-27 DIAGNOSIS — IMO0002 Reserved for concepts with insufficient information to code with codable children: Secondary | ICD-10-CM

## 2015-10-27 DIAGNOSIS — D860 Sarcoidosis of lung: Secondary | ICD-10-CM

## 2015-10-27 DIAGNOSIS — M869 Osteomyelitis, unspecified: Secondary | ICD-10-CM

## 2015-10-27 DIAGNOSIS — E1165 Type 2 diabetes mellitus with hyperglycemia: Secondary | ICD-10-CM

## 2015-10-27 HISTORY — DX: Tinea unguium: B35.1

## 2015-10-27 MED ORDER — TERBINAFINE HCL 250 MG PO TABS: 250.0000 mg | ORAL_TABLET | Freq: Every day | ORAL | Status: DC

## 2015-10-27 NOTE — Progress Notes (Signed)
Chief complaint: followup for calcaneal osteomyelitis  Subjective:    Patient ID: [name], female    DOB: January 22, 1961, 55 y.o.   MRN: [id number]  HPI   55 year old female with multiple medical problems who initially had a fracture of her 5th metatarsal and developed non union. Underwent ORIF with bone grafting but then fell and broke the 5th metatarsal again. She underwent hardware removal and ORIF again  with midfoot reconstruction. The calcaneal osteotomy did not heal well and in May 2016 noted draiange, which she describes as white, pus like drainage. Developed swelling of foot and leg. She was started on Septra DS, 2 pills bid with rifampin though did not resolve. Underwent I and D in [place] with associated hardware removal but presented here to Dr. Percell Miller with persistent drainage. She underwent I and D and superficial swab with MRSA and CoNS, no bone culture done. She was started on and has continued with vancomycin and rifampin. Unfortunately, she developed AKI and was reshospitalized. Her creat was increased to 1.87 and vancomycin stopped and started linezolid. Her creat remained stable, though still up from her baseline at 1.68 after her visit to Dr. Yong Channel. She was sent out on doxycyline after her insurace refused linezolid coverage. She developed a rash with this but continued the doxycyline, until seen by Dr. Linus Salmons roughly 16 days ago. She was taken off doxycycline but since has seen Dr. Para March who is concerned that the patient had worsening left heel pain which is hard for her now to bear. Patient had labs done which showed an elevated sedimentation rate above 50. 4 to layer creatinine had now improved to 1.35. See labs of lobe picture.  She had  been restarted on doxycycline and has had re-emergence of her rash is intensely pruritic  When I later saw  her I was concerned for worsening osteomyelitis and obtained MRI of foot on 06/03/15 which showed:  IMPRESSION: 1.  There is evidence of prior calcaneal osteotomy of the left posterior calcaneus with hardware removal. There is a soft tissue wound along the posterior lateral aspect of the left hindfoot extending to the lateral osteotomy site of the calcaneus. There is soft tissue enhancement and osseous enhancement with mild cortical irregularity along the lateral aspect of the calcaneal osteotomy site which may reflect postsurgical granulation tissue, but osteomyelitis cannot be completely excluded. No drainable fluid collection.  She was seen b y Dr. Sharol Given with orthopedic surgery. I had stopped her doxycyline in hopes that we might obtain helpful cultures in the OR to guide therapy if she underwent surgery   She  was seen by Dr Sharol Given who restarted her oral doxycycline and then admitted her to [facility] where he performed Partial Excision Left Calcaneus, Place Antibiotic Beads, and Wound VAC on 06/27/15. The patient states that she has not been on ANY antibiotics postoperatively whatsoever. Her cultures on doxycycline did not yield any organism. She has NOT had worsening of pain or drainage since her surgery which was a month prior to my seeing her again. I DID propose however that she undergo course of rx with IV antibiotics to maximize her chances of control, cure of this calcaneal osteomyelitis and we started IV daptomicin to which she apparently developed a rash. This was stopped and she was changed to IV teflaro which she also developed a rash. She was NOT able to get through the 8 weeks of IV abx we had planned instead roughly 32 days of IV antibiotics.  We did not switch her to an oral antibiotic due to her multiple allergies.   Since coming off antibiotics she had experienced worsening pain in her heel both with weight bearing and at rest. No nausea vomiting, fevers or systemic symptoms.  Her inflammatory markers were up and we were worried and therefore he obtained an MRI in mid December which  showed:   IMPRESSION: 1. Interval progressive healing of calcaneal osteotomy. No evidence of osteomyelitis. 2. The lateral hindfoot soft tissue wound/tract demonstrates mild contraction. No evidence of soft tissue abscess. 3. Peroneal tenosynovitis and subcutaneous edema in the distal lower leg have mildly worsened.   She has had interval improvement in her wound and pain in her heel. She did have an admission to the hospital for a flare of her sarcoidosis that is not just involving her lungs but her eyes she is going to [facility] to see Dr. Casper Harrison for follow-up for this. He is also being seen by Dr. Prudencio Burly here in [place] with regards to the ophthalmic involvement.  Her inflammatory markers were elevated when she was inpatient but is concerned well be due to her sarcoid    Past Medical History  Diagnosis Date  . DIABETES MELLITUS, TYPE II 08/21/2006  . HYPERLIPIDEMIA 08/21/2006  . GOUT 08/20/2010  . OBESITY 06/04/2009  . ANEMIA-UNSPECIFIED 09/18/2009  . HYPERTENSION 08/21/2006  . GERD 08/21/2006  . Internal hemorrhoids   . Pulmonary sarcoidosis ([place])     Followed locally by pulmonology, but also by Dr. Casper Harrison at [facility]  . Exertional chest pain     sharp, substernal, exertional  . Vocal cord dysfunction   . Chronic diastolic heart failure, NYHA class 2 (HCC)     LVEDP roughly 20% by cath  . CHF (congestive heart failure) ([place])   . COPD (chronic obstructive pulmonary disease) ([place])   . Depression   . Fracture of 5th metatarsal     non union  . Abnormal SPEP 04/17/2014  . Osteomyelitis of left foot ([place]) 05/29/2015  . Diabetic osteomyelitis ([place]) 05/29/2015  . Hx of umbilical hernia repair   . Infection of wound due to methicillin resistant Staphylococcus aureus (MRSA)   . Wears partial dentures     Past Surgical History  Procedure Laterality Date  . Ventral hernia repair    . Nissen fundoplication  [id number]  . Cholecystectomy  1984  . Abdominal  hysterectomy    . Knee arthroscopy      right  . Tubal ligation      with reversal in 1994  . Bladder suspension  11/11/2011    Procedure: TRANSVAGINAL TAPE (TVT) PROCEDURE;  Surgeon: Olga Millers, MD;  Location: [facility];  Service: Gynecology;  Laterality: N/A;  . Cystoscopy  11/11/2011    Procedure: CYSTOSCOPY;  Surgeon: Olga Millers, MD;  Location: [facility];  Service: Gynecology;  Laterality: N/A;  . Doppler echocardiography  02/12/2013    LV FUNCTION, SIZE NORMAL; MILD CONCENTRIC LVH; EST EF 55-65%; WALL MOTION NORMAL  . Lexiscan myoview  03/09/2013    EF 50%; NORMAL MYOCARDIAL PERFUSION STUDY - breast attenuation  . Cardiac catheterization  07/2010    LVEF 50-55% WITH VERY MILD GLOBAL HYPOKINESIA; ESSENTIALLY NORMAL CORONARY ARTERIES; NORMAL LV FUNCTION  . Carotids  02/18/11    CAROTID DUPLEX; VERTEBRALS ARE PATENT WITH ANTEGRADE FLOW. ICA/CCA RATIO 1.61 ON RIGHT AND 0.75 ON LEFT  . Right and left cardiac catheterization  04/23/2013    Angiographic normal coronaries; LVEDP 20 mmHg,  PCWP 12-14 mmHg, RAP 12 mmHg.; Fick CO/CI 4.9/2.2  . Metatarsal osteotomy with open reduction internal fixation (orif) metatarsal with fusion Left 04/09/2014    Procedure: LEFT FOOT FRACTURE OPEN TREATMENT METATARSAL INCLUDES INTERNAL FIXATION EACH;  Surgeon: Lorn Junes, MD;  Location: [place];  Service: Orthopedics;  Laterality: Left;  . Left and right heart catheterization with coronary angiogram N/A 04/23/2013    Procedure: LEFT AND RIGHT HEART CATHETERIZATION WITH CORONARY ANGIOGRAM;  Surgeon: Leonie Man, MD;  Location: [facility] C[facility];  Service: Cardiovascular;  Laterality: N/A;  . Hernia repair    . Appendectomy    . Fracture surgery    . I&d extremity Left 06/27/2015    Procedure: Partial Excision Left Calcaneus, Place Antibiotic Beads, and Wound VAC;  Surgeon: Newt Minion, MD;  Location: [place];  Service: Orthopedics;  Laterality: Left;    Family History  Problem Relation Age  of Onset  . Diabetes Father   . Heart attack Father   . Coronary artery disease Father   . Heart failure Father   . COPD Mother   . Emphysema Mother   . Asthma Mother   . Heart failure Mother   . Sarcoidosis Maternal Uncle   . Colon cancer Neg Hx   . Lung cancer Brother   . Cancer Brother   . Diabetes Brother   . Heart attack Maternal Grandfather       Social History   Social History  . Marital Status: Married    Spouse Name: KASIDY POGANY  . Number of Children: 2  . Years of Education: 12   Occupational History  . DISABLED    Social History Main Topics  . Smoking status: Never Smoker   . Smokeless tobacco: Never Used  . Alcohol Use: No  . Drug Use: No  . Sexual Activity: Yes    Birth Control/ Protection: Surgical   Other Topics Concern  . Not on file   Social History Narrative   Married 1994. 2 sons who both live close and 1 grandson.       Disability due to sarcoidosis. 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.       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 Vancomycin Other (See Comments)    nephrotoxicity  . Chlorhexidine Itching  . Lisinopril Cough  . Methotrexate Other (See Comments)     peri-oral and buccal lesions.  . Clindamycin/Lincomycin Nausea And Vomiting and Rash  . Doxycycline Rash  . Teflaro [Ceftaroline] Rash     Current outpatient prescriptions:  .  acetaminophen (TYLENOL) 325 MG tablet, Take 650 mg by mouth every 6 (six) hours as needed for headache., Disp: , Rfl:  .  albuterol (PROVENTIL HFA;VENTOLIN HFA) 108 (90 BASE) MCG/ACT inhaler, Inhale 1-2 puffs into the lungs every 6 (six) hours as needed for wheezing or shortness of breath., Disp: 8 g, Rfl: 5 .  allopurinol (ZYLOPRIM) 100 MG tablet, Take 100 mg by mouth at bedtime. , Disp: , Rfl:  .  aspirin EC 81 MG tablet, Take 81 mg by mouth daily. , Disp: ,  Rfl:  .  atorvastatin (LIPITOR) 40 MG tablet, TAKE ONE TABLET BY MOUTH ONE TIME DAILY, Disp: 90 tablet, Rfl: 3 .  B-D ULTRAFINE III SHORT PEN 31G X 8 MM MISC, FOLLOW DIRECTIONS PROVIDED BY PHYSICIAN, Disp: 130 each, Rfl: 2 .  buPROPion (WELLBUTRIN XL) 300 MG 24 hr tablet, Take 1 tablet (300 mg total) by mouth daily., Disp: 90 tablet, Rfl: 3 .  carvedilol (COREG) 25 MG tablet, Take 1 tablet (25 mg total) by mouth 2 (two) times daily with a meal. MUST KEEP APPOINTMENT 11/20/2015 WITH DR HARDING FOR FUTURE REFILLS, Disp: 60 tablet, Rfl: 1 .  chlorpheniramine-HYDROcodone (TUSSIONEX) 10-8 MG/5ML SUER, Take 5 mLs by mouth every 12 (twelve) hours as needed for cough., Disp: 120 mL, Rfl: 0 .  cholecalciferol 5000 UNITS TABS, Take 1 tablet (5,000 Units total) by mouth daily. (Patient taking differently: Take 5,000 Units by mouth every morning. ), Disp: 30 tablet, Rfl: 3 .  DEXILANT 60 MG capsule, TAKE ONE CAPSULE BY MOUTH ONE TIME DAILY, Disp: 30 capsule, Rfl: 6 .  diltiazem (CARTIA XT) 120 MG 24 hr capsule, Take 1 capsule (120 mg total) by mouth daily., Disp: 30 capsule, Rfl: 1 .  estradiol (ESTRACE) 2 MG tablet, Take 2 mg by mouth at bedtime. , Disp: , Rfl:  .  fenofibrate (TRICOR) 145 MG tablet, TAKE ONE TABLET BY MOUTH ONE TIME DAILY  (Patient taking differently: TAKE ONE TABLET BY MOUTH every evening), Disp: 30 tablet, Rfl: 10 .  fluticasone (FLONASE) 50 MCG/ACT nasal spray, USE TWO SPRAYS IN EACH NOSTRIL DAILY, Disp: 16 g, Rfl: 4 .  furosemide (LASIX) 40 MG tablet, Take 1 tablet (40 mg total) by mouth 2 (two) times daily., Disp: 60 tablet, Rfl: 4 .  furosemide (LASIX) 40 MG tablet, Take 1 tablet (40 mg total) by mouth 2 (two) times daily. PATIENT NEEDS TO CONTACT OFFICE FOR ADDITIONAL REFILLS (Patient not taking: Reported on 10/13/2015), Disp: 30 tablet, Rfl: 0 .  gabapentin (NEURONTIN) 100 MG capsule, Take 1 capsule (100 mg total) by mouth 3 (three) times daily. **PT NEEDS AN APPT FOR FURTHER REFILLS**,  Disp: 90 capsule, Rfl: 1 .  glucose blood (ONE TOUCH ULTRA TEST) test strip, 1 each by Other route 3 (three) times daily. Use as instructed, Disp: 100 each, Rfl: 5 .  HYDROcodone-acetaminophen (NORCO) 10-325 MG per tablet, Take 1-2 tablets by mouth every 4 (four) hours as needed for moderate pain. , Disp: , Rfl: 0 .  insulin aspart (NOVOLOG FLEXPEN) 100 UNIT/ML FlexPen, Inject 7-12 Units into the skin 3 (three) times daily with meals. Per sliding scale, Disp: , Rfl:  .  Insulin Glargine (TOUJEO SOLOSTAR) 300 UNIT/ML SOPN, Inject 40 Units into the skin at bedtime., Disp: 3 pen, Rfl: 2 .  Insulin Pen Needle 31G X 8 MM MISC, Use to inject insulin 4 times daily., Disp: 130 each, Rfl: 2 .  losartan (COZAAR) 50 MG tablet, Take 1 tablet (50 mg total) by mouth daily. (Patient taking differently: Take 100 mg by mouth daily. ), Disp: 90 tablet, Rfl: 3 .  metFORMIN (GLUCOPHAGE) 1000 MG tablet, TAKE ONE TABLET BY MOUTH TWICE DAILY with a meal., Disp: 180 tablet, Rfl: 2 .  mometasone-formoterol (DULERA) 200-5 MCG/ACT AERO, Inhale 2 puffs into the lungs 2 (two) times daily., Disp: 13 g, Rfl: 5 .  nitroGLYCERIN (NITROSTAT) 0.4 MG SL tablet, Place 0.4 mg under the tongue every 5 (five) minutes as needed. x3 doses as needed for chest pain., Disp: , Rfl:  .  NOVOLOG FLEXPEN 100 UNIT/ML FlexPen, INJECT 7-12 UNITS INTO THE SKIN 3 TIMES DAILY WITH MEALS. (Patient not taking: Reported on 10/13/2015), Disp: 15 pen, Rfl: 1 .  ondansetron (ZOFRAN-ODT) 4 MG disintegrating tablet, Take 4 mg by mouth every 8 (eight) hours  as needed for nausea or vomiting. , Disp: , Rfl: 0 .  PARoxetine Mesylate (BRISDELLE) 7.5 MG CAPS, Take 7.5 mg by mouth daily., Disp: , Rfl:  .  potassium chloride SA (K-DUR,KLOR-CON) 20 MEQ tablet, Take 1 tablet (20 mEq total) by mouth 3 (three) times daily., Disp: 90 tablet, Rfl: 1 .  predniSONE (DELTASONE) 5 MG tablet, Take 8 tabs orally by mouth for 2 days, then take 6 tabs orally by mouth for 2 days, then  take 4 tabs orally by mouth for 2 days, then take 2 tabs orally by mouth for 2 days, then continue your regular dose of 5 mg., Disp: 90 tablet, Rfl: 0 .  promethazine (PHENERGAN) 25 MG tablet, Take 1 tablet (25 mg total) by mouth every 6 (six) hours as needed for nausea or vomiting., Disp: 15 tablet, Rfl: 0 .  venlafaxine XR (EFFEXOR-XR) 75 MG 24 hr capsule, Take 1 capsule (75 mg total) by mouth 2 (two) times daily., Disp: 180 capsule, Rfl: 3 .  vitamin B-12 (CYANOCOBALAMIN) 1000 MCG tablet, Take 1,000 mcg by mouth daily., Disp: , Rfl:       Review of Systems  Constitutional: Negative for fever, chills, diaphoresis, activity change, appetite change, fatigue and unexpected weight change.  HENT: Negative for congestion, rhinorrhea, sinus pressure, sneezing, sore throat and trouble swallowing.   Eyes: Negative for photophobia and visual disturbance.  Respiratory: Negative for cough, chest tightness, shortness of breath, wheezing and stridor.   Cardiovascular: Negative for chest pain, palpitations and leg swelling.  Gastrointestinal: Negative for nausea, vomiting, abdominal pain, diarrhea, constipation, blood in stool, abdominal distention and anal bleeding.  Genitourinary: Negative for dysuria, hematuria, flank pain and difficulty urinating.  Musculoskeletal: Positive for arthralgias. Negative for myalgias, back pain, joint swelling and gait problem.  Skin: Positive for rash. Negative for color change, pallor and wound.  Neurological: Negative for dizziness, tremors, weakness and light-headedness.  Hematological: Negative for adenopathy. Does not bruise/bleed easily.  Psychiatric/Behavioral: Negative for behavioral problems, confusion, sleep disturbance, dysphoric mood, decreased concentration and agitation.       Objective:   Physical Exam  Constitutional: She is oriented to person, place, and time. She appears well-developed and well-nourished. No distress.  HENT:  Head: Normocephalic and  atraumatic.  Mouth/Throat: No oropharyngeal exudate.  Eyes: Conjunctivae and EOM are normal. No scleral icterus.  Neck: Normal range of motion. Neck supple.  Cardiovascular: Normal rate and regular rhythm.   Pulmonary/Chest: Effort normal. No respiratory distress. She has no wheezes.  Abdominal: She exhibits no distension.  Musculoskeletal: She exhibits no edema or tenderness.  Neurological: She is alert and oriented to person, place, and time. She exhibits normal muscle tone. Coordination normal.  Skin: Skin is warm and dry. Rash noted. She is not diaphoretic. No erythema.  Psychiatric: She has a normal mood and affect. Her behavior is normal. Judgment and thought content normal.     Left foot 05/29/2015:    Left heel area 16 2017:  Heel continues to appear well-healed     Left foot 09/11/15: note a new scar from where she dropped a curling iron on her foot      Right foot:          Assessment & Plan:   55 year old with calcaneal osteomyelitis and metatarsal infection originally due to fracture also with diabetes mellitus. Above infection was complicated by presence of hardware. MRI in August showed osteomyelitis persisting sp surgery by Dr Sharol Given with deep cultures (though ON abx) and culture unrevealing.  We tried IV abx but only got through 104 of planned 56 days due to rashes to every IV abx we had tried  MRI was encouraging and she has done well off antibiotics will see her back in several months time and recheck her inflammatory markers  Multiple allergies on various antibiotics: wonder if many of these are driven by supratentorial or in fact subconscious suggestion or if she ireally is allergic to all of these different classes of antibiotics   AKI from vancomycin resolved    Sarcoid flare with lung and eye involvement: This is likely responsible for elevated inflammatory markers she is to [facility] pulmonary tomorrow and is also being seen by Dr. Gillian Scarce  with ophthalmology  Onychomycosis of toenails I'll give her Lamisil for 3 months  I spent greater than 25   minutes with the patient including greater than 50% of time in face to face counsel of the patient regarding her diabetic foot infection calcaneal osteomyelitis metatarsal infection MRSA colonization and rashes, allergies with various abx y from vancomycin, onychomycosis  Her sarcoid and in coordination of her care.

## 2015-10-28 DIAGNOSIS — D869 Sarcoidosis, unspecified: Secondary | ICD-10-CM | POA: Diagnosis not present

## 2015-10-28 DIAGNOSIS — Z6841 Body Mass Index (BMI) 40.0 and over, adult: Secondary | ICD-10-CM | POA: Diagnosis not present

## 2015-10-28 DIAGNOSIS — D860 Sarcoidosis of lung: Secondary | ICD-10-CM | POA: Diagnosis not present

## 2015-10-29 ENCOUNTER — Other Ambulatory Visit: Payer: Self-pay | Admitting: Internal Medicine

## 2015-10-31 ENCOUNTER — Encounter: Payer: Self-pay | Admitting: Family Medicine

## 2015-10-31 ENCOUNTER — Ambulatory Visit (INDEPENDENT_AMBULATORY_CARE_PROVIDER_SITE_OTHER): Payer: BLUE CROSS/BLUE SHIELD | Admitting: Family Medicine

## 2015-10-31 VITALS — BP 140/72 | HR 80 | Temp 98.2°F | Wt 256.0 lb

## 2015-10-31 DIAGNOSIS — I5032 Chronic diastolic (congestive) heart failure: Secondary | ICD-10-CM | POA: Diagnosis not present

## 2015-10-31 DIAGNOSIS — D860 Sarcoidosis of lung: Secondary | ICD-10-CM | POA: Diagnosis not present

## 2015-10-31 DIAGNOSIS — E1140 Type 2 diabetes mellitus with diabetic neuropathy, unspecified: Secondary | ICD-10-CM

## 2015-10-31 DIAGNOSIS — Z794 Long term (current) use of insulin: Secondary | ICD-10-CM

## 2015-10-31 DIAGNOSIS — J441 Chronic obstructive pulmonary disease with (acute) exacerbation: Secondary | ICD-10-CM | POA: Diagnosis not present

## 2015-10-31 DIAGNOSIS — E1165 Type 2 diabetes mellitus with hyperglycemia: Secondary | ICD-10-CM

## 2015-10-31 DIAGNOSIS — IMO0002 Reserved for concepts with insufficient information to code with codable children: Secondary | ICD-10-CM

## 2015-10-31 NOTE — Progress Notes (Signed)
Garret Reddish, MD  Subjective:  [name] is a 55 y.o. year old very pleasant female patient who presents for/with See problem oriented charting ROS- denies chest pain. At her baseline for shortness of breath. No headache or blurry vision. Sugars have been improving with most recent fasting 150  Past Medical History-  Patient Active Problem List   Diagnosis Date Noted  . Obstructive chronic bronchitis without exacerbation ([place]) 09/18/2013    Priority: High  . Chest pain - at rest, and with exertion; -- angiographically normal coronary arteries, mildly elevated LVEDP 04/11/2013    Priority: High  . Chronic diastolic heart failure, NYHA class 2 ([place])     Priority: High  . Sarcoidosis of lung ([place]) 04/10/2007    Priority: High  . Type II diabetes mellitus with neurological manifestations, uncontrolled ([place]) 08/21/2006    Priority: High  . Fatty liver 09/30/2014    Priority: Medium  . Depression     Priority: Medium  . Gout 08/20/2010    Priority: Medium  . Anemia 09/18/2009    Priority: Medium  . Hyperlipidemia 08/21/2006    Priority: Medium  . Essential hypertension 08/21/2006    Priority: Medium  . Hot flashes 07/15/2014    Priority: Low  . Abnormal SPEP 04/17/2014    Priority: Low  . Fracture of left leg 04/17/2014    Priority: Low  . Cushingoid side effect of steroids ([place]) 04/17/2014    Priority: Low  . Internal hemorrhoids     Priority: Low  . Preoperative clearance 03/25/2014    Priority: Low  . Other and unspecified coagulation defects 04/22/2013    Priority: Low  . Solitary pulmonary nodule, on CT 02/2013 - stable over 2 years in 2015 02/20/2013    Priority: Low  . Chronic cough 12/30/2011    Priority: Low  . Obesity 06/04/2009    Priority: Low  . GERD 08/21/2006    Priority: Low  . Onychomycosis 10/27/2015  . COPD exacerbation ([place]) 10/13/2015  . Acute osteomyelitis of left calcaneus ([place]) 06/27/2015  . Osteomyelitis of left foot ([place]) 05/29/2015   . Diabetic osteomyelitis ([place]) 05/29/2015  . MRSA (methicillin resistant staph aureus) culture positive 03/27/2015  . Wound infection complicating hardware ([place]) 03/27/2015  . Unspecified sleep apnea 04/21/2009    Medications- reviewed and updated Current Outpatient Prescriptions  Medication Sig Dispense Refill  . allopurinol (ZYLOPRIM) 100 MG tablet Take 100 mg by mouth at bedtime.     [name] Kitchen aspirin EC 81 MG tablet Take 81 mg by mouth daily.     [name] Kitchen atorvastatin (LIPITOR) 40 MG tablet TAKE ONE TABLET BY MOUTH ONE TIME DAILY 90 tablet 3  . B-D ULTRAFINE III SHORT PEN 31G X 8 MM MISC FOLLOW DIRECTIONS PROVIDED BY PHYSICIAN 130 each 2  . buPROPion (WELLBUTRIN XL) 300 MG 24 hr tablet Take 1 tablet (300 mg total) by mouth daily. 90 tablet 3  . carvedilol (COREG) 25 MG tablet Take 1 tablet (25 mg total) by mouth 2 (two) times daily with a meal. MUST KEEP APPOINTMENT 11/20/2015 WITH DR HARDING FOR FUTURE REFILLS 60 tablet 1  . cholecalciferol 5000 UNITS TABS Take 1 tablet (5,000 Units total) by mouth daily. (Patient taking differently: Take 5,000 Units by mouth every morning. ) 30 tablet 3  . DEXILANT 60 MG capsule TAKE ONE CAPSULE BY MOUTH ONE TIME DAILY 30 capsule 6  . diltiazem (CARTIA XT) 120 MG 24 hr capsule Take 1 capsule (120 mg total) by mouth daily. 30 capsule  1  . estradiol (ESTRACE) 2 MG tablet Take 2 mg by mouth at bedtime.     . fenofibrate (TRICOR) 145 MG tablet TAKE ONE TABLET BY MOUTH ONE TIME DAILY  (Patient taking differently: TAKE ONE TABLET BY MOUTH every evening) 30 tablet 10  . fluticasone (FLONASE) 50 MCG/ACT nasal spray USE TWO SPRAYS IN EACH NOSTRIL DAILY 16 g 4  . furosemide (LASIX) 40 MG tablet Take 1 tablet (40 mg total) by mouth 2 (two) times daily. PATIENT NEEDS TO CONTACT OFFICE FOR ADDITIONAL REFILLS 30 tablet 0  . gabapentin (NEURONTIN) 100 MG capsule Take 1 capsule (100 mg total) by mouth 3 (three) times daily. **PT NEEDS AN APPT FOR FURTHER REFILLS** 90 capsule 1  .  glucose blood (ONE TOUCH ULTRA TEST) test strip 1 each by Other route 3 (three) times daily. Use as instructed 100 each 5  . insulin aspart (NOVOLOG FLEXPEN) 100 UNIT/ML FlexPen Inject 7-12 Units into the skin 3 (three) times daily with meals. Per sliding scale    . Insulin Pen Needle 31G X 8 MM MISC Use to inject insulin 4 times daily. 130 each 2  . losartan (COZAAR) 50 MG tablet Take 1 tablet (50 mg total) by mouth daily. (Patient taking differently: Take 100 mg by mouth daily. ) 90 tablet 3  . metFORMIN (GLUCOPHAGE) 1000 MG tablet TAKE ONE TABLET BY MOUTH TWICE DAILY with a meal. 180 tablet 2  . mometasone-formoterol (DULERA) 200-5 MCG/ACT AERO Inhale 2 puffs into the lungs 2 (two) times daily. 13 g 5  . PARoxetine Mesylate (BRISDELLE) 7.5 MG CAPS Take 7.5 mg by mouth daily.    . potassium chloride SA (K-DUR,KLOR-CON) 20 MEQ tablet Take 1 tablet (20 mEq total) by mouth 3 (three) times daily. 90 tablet 1  . predniSONE (DELTASONE) 5 MG tablet Take 8 tabs orally by mouth for 2 days, then take 6 tabs orally by mouth for 2 days, then take 4 tabs orally by mouth for 2 days, then take 2 tabs orally by mouth for 2 days, then continue your regular dose of 5 mg. 90 tablet 0  . terbinafine (LAMISIL) 250 MG tablet Take 1 tablet (250 mg total) by mouth daily. 30 tablet 3  . TOUJEO SOLOSTAR 300 UNIT/ML SOPN INJECT 40 UNITS INTO THE SKIN AT BEDTIME 13.5 mL 1  . venlafaxine XR (EFFEXOR-XR) 75 MG 24 hr capsule Take 1 capsule (75 mg total) by mouth 2 (two) times daily. 180 capsule 3  . vitamin B-12 (CYANOCOBALAMIN) 1000 MCG tablet Take 1,000 mcg by mouth daily.    [name] Kitchen acetaminophen (TYLENOL) 325 MG tablet Take 650 mg by mouth every 6 (six) hours as needed for headache. Reported on 10/31/2015    . albuterol (PROVENTIL HFA;VENTOLIN HFA) 108 (90 BASE) MCG/ACT inhaler Inhale 1-2 puffs into the lungs every 6 (six) hours as needed for wheezing or shortness of breath. (Patient not taking: Reported on 10/31/2015) 8 g 5  .  HYDROcodone-acetaminophen (NORCO) 10-325 MG per tablet Take 1-2 tablets by mouth every 4 (four) hours as needed for moderate pain. Reported on 10/31/2015  0  . nitroGLYCERIN (NITROSTAT) 0.4 MG SL tablet Place 0.4 mg under the tongue every 5 (five) minutes as needed. Reported on 10/31/2015    . ondansetron (ZOFRAN-ODT) 4 MG disintegrating tablet Take 4 mg by mouth every 8 (eight) hours as needed for nausea or vomiting. Reported on 10/31/2015  0  . promethazine (PHENERGAN) 25 MG tablet Take 1 tablet (25 mg total) by mouth every  6 (six) hours as needed for nausea or vomiting. (Patient not taking: Reported on 10/31/2015) 15 tablet 0   No current facility-administered medications for this visit.    Objective: BP 140/72 mmHg  Pulse 80  Temp(Src) 98.2 F (36.8 C)  Wt 256 lb (116.121 kg)  SpO2 97% Gen: NAD, resting comfortably CV: RRR no murmurs rubs or gallops Lungs: CTAB no crackles, wheeze, rhonchi Abdomen: soft/nontender/nondistended/normal bowel sounds. No rebound or guarding. obese Ext: no edema Skin: warm, dry, no rash Neuro: grossly normal, moves all extremities  Assessment/Plan:  COPD exacerbation. - resolved. In setting of sarcoidosis of the lung.  Diabetes mellitus- improved now that off high dose steroids Diastolic CHF- stable on lasix 40mg  BID S: Patient was hospitalized from  10/13/15 to 10/18/15 with  COPD exacerbation. Had been seen in our office and thought to have URI but shortness of breath worsened and change in sputum color and productoin amount. She noted dyspnea with minimal exertion and home albuterol nebs did not help. She wen tot the ED and was in obvious respiroatroy distress with tachypnea and increased work of breathing. Chest x-ray showed hypoexpansion with bibasilar atelectasis and mild vascular congestion. She also has CHF on lasix. WBC elevated to 17k. strated on levaquin, nebulized treatments and IV solumedrol. Slowly recovered over 5 days and sent on steroid taper back  to her 5mg  baseline- which she is on today. Also had very minor bump in troponin to 0.04 which resolved and thought due to COPD exacerbation and stress of this. She also had initial lactic acidosis of 2.3 which resolved with hydration and treatment. Today, she is doing very well and states breathing back to baseline though she does have shortness of breath with exertion such as up staris at baseline. Her sugars on higher dose prednisone were in 300 and 400 level but now back down into 150 range.  A/P: Presents for ED follow up 13 days out- all of her issues have now resolved including copd exacerbation, elevation in CBGs from steroids. Her CHF did not appear to be a big part of hospitalization and is stable. She will continue on chronic 5mg  prednisone for sarcoidosis of the lung.   Return precautions advised. May push next month visit back a few months if she would like. We will monitor BP- previously controlled with 1x elevation today even on repeat- consider adjustments if remains high at follow up BP Readings from Last 3 Encounters:  10/31/15 140/72  10/27/15 130/78  10/18/15 125/60

## 2015-10-31 NOTE — Patient Instructions (Signed)
Glad you have recovered from both of your illnesses!   See you back as previously planned  No changes in medicine- continue 5mg  prednisone

## 2015-11-12 ENCOUNTER — Encounter: Payer: Self-pay | Admitting: Family Medicine

## 2015-11-12 ENCOUNTER — Ambulatory Visit (INDEPENDENT_AMBULATORY_CARE_PROVIDER_SITE_OTHER): Payer: BLUE CROSS/BLUE SHIELD | Admitting: Family Medicine

## 2015-11-12 VITALS — BP 138/62 | HR 84 | Temp 97.9°F | Wt 255.0 lb

## 2015-11-12 DIAGNOSIS — N3000 Acute cystitis without hematuria: Secondary | ICD-10-CM

## 2015-11-12 DIAGNOSIS — B9789 Other viral agents as the cause of diseases classified elsewhere: Secondary | ICD-10-CM

## 2015-11-12 DIAGNOSIS — J329 Chronic sinusitis, unspecified: Secondary | ICD-10-CM

## 2015-11-12 DIAGNOSIS — R109 Unspecified abdominal pain: Secondary | ICD-10-CM

## 2015-11-12 DIAGNOSIS — I5032 Chronic diastolic (congestive) heart failure: Secondary | ICD-10-CM

## 2015-11-12 DIAGNOSIS — R10A0 Flank pain, unspecified side: Secondary | ICD-10-CM

## 2015-11-12 DIAGNOSIS — B349 Viral infection, unspecified: Secondary | ICD-10-CM

## 2015-11-12 LAB — POCT URINALYSIS DIPSTICK
Bilirubin, UA: NEGATIVE
Glucose, UA: NEGATIVE
Ketones, UA: NEGATIVE
Nitrite, UA: NEGATIVE
Protein, UA: NEGATIVE
Spec Grav, UA: 1.01
Urobilinogen, UA: 0.2
pH, UA: 5.5

## 2015-11-12 MED ORDER — FUROSEMIDE 40 MG PO TABS: 40.0000 mg | ORAL_TABLET | Freq: Two times a day (BID) | ORAL | Status: DC

## 2015-11-12 NOTE — Assessment & Plan Note (Signed)
S: Patient states she has had some more shortness of breath with lying down despite continued lasix. Sleeps on 3 pillows- no recent increase. She is not sure how much lasix she is stating- rx would indicate did not have enough to take BID or daily A/P: refilled rx and has cardiology follow up next week. Per avs on how to increase lasix for next 3 days- she does seem to have some increased edema as well as reported orthopnea increase.

## 2015-11-12 NOTE — Patient Instructions (Addendum)
We will get a urine culture to make sure we treat with right antibiotic. Given you have to have them so frequently- want to make sure right one to not increase antibiotic resistane for you  I am tempted to use azithromycin for your sinus infection. It is early and I am suspicious of viral infection and antibiotic may not help - but with your tendency to get sicker want to be cautious. We held off to see if we can give antibiotic that will cover UTI and sinus infection.   Please call me and give me an update on how you are doing early Friday morning and make sure it gets through to me as I will be out of town that afternoon  Finally refilled your lasix.  Please see how much you are taking at home If 1 a day: take 1 pill twice a day for 3 days then resume once a day If twice a day: take 2 pills in morning then 1 pill 6 hours later for 3 days then resume prior dosing  Glad you have cardiology follow up

## 2015-11-12 NOTE — Progress Notes (Signed)
Garret Reddish, MD  Subjective:  [name] is a 55 y.o. year old very pleasant female patient who presents for/with See problem oriented charting ROS- see HPI ROS.   Past Medical History-  Patient Active Problem List   Diagnosis Date Noted  . Obstructive chronic bronchitis without exacerbation ([place]) 09/18/2013    Priority: High  . Chest pain - at rest, and with exertion; -- angiographically normal coronary arteries, mildly elevated LVEDP 04/11/2013    Priority: High  . Chronic diastolic heart failure, NYHA class 2 ([place])     Priority: High  . Sarcoidosis of lung ([place]) 04/10/2007    Priority: High  . Type II diabetes mellitus with neurological manifestations, uncontrolled ([place]) 08/21/2006    Priority: High  . Fatty liver 09/30/2014    Priority: Medium  . Depression     Priority: Medium  . Gout 08/20/2010    Priority: Medium  . Anemia 09/18/2009    Priority: Medium  . Hyperlipidemia 08/21/2006    Priority: Medium  . Essential hypertension 08/21/2006    Priority: Medium  . Hot flashes 07/15/2014    Priority: Low  . Abnormal SPEP 04/17/2014    Priority: Low  . Fracture of left leg 04/17/2014    Priority: Low  . Cushingoid side effect of steroids ([place]) 04/17/2014    Priority: Low  . Internal hemorrhoids     Priority: Low  . Preoperative clearance 03/25/2014    Priority: Low  . Other and unspecified coagulation defects 04/22/2013    Priority: Low  . Solitary pulmonary nodule, on CT 02/2013 - stable over 2 years in 2015 02/20/2013    Priority: Low  . Chronic cough 12/30/2011    Priority: Low  . Obesity 06/04/2009    Priority: Low  . GERD 08/21/2006    Priority: Low  . Onychomycosis 10/27/2015  . COPD exacerbation ([place]) 10/13/2015  . Acute osteomyelitis of left calcaneus ([place]) 06/27/2015  . Osteomyelitis of left foot ([place]) 05/29/2015  . Diabetic osteomyelitis ([place]) 05/29/2015  . MRSA (methicillin resistant staph aureus) culture positive 03/27/2015  . Wound  infection complicating hardware ([place]) 03/27/2015  . Unspecified sleep apnea 04/21/2009    Medications- reviewed and updated Current Outpatient Prescriptions  Medication Sig Dispense Refill  . allopurinol (ZYLOPRIM) 100 MG tablet Take 100 mg by mouth at bedtime.     [name] aspirin EC 81 MG tablet Take 81 mg by mouth daily.     [name] atorvastatin (LIPITOR) 40 MG tablet TAKE ONE TABLET BY MOUTH ONE TIME DAILY 90 tablet 3  . B-D ULTRAFINE III SHORT PEN 31G X 8 MM MISC FOLLOW DIRECTIONS PROVIDED BY PHYSICIAN 130 each 2  . buPROPion (WELLBUTRIN XL) 300 MG 24 hr tablet Take 1 tablet (300 mg total) by mouth daily. 90 tablet 3  . carvedilol (COREG) 25 MG tablet Take 1 tablet (25 mg total) by mouth 2 (two) times daily with a meal. MUST KEEP APPOINTMENT 11/20/2015 WITH DR HARDING FOR FUTURE REFILLS 60 tablet 1  . cholecalciferol 5000 UNITS TABS Take 1 tablet (5,000 Units total) by mouth daily. (Patient taking differently: Take 5,000 Units by mouth every morning. ) 30 tablet 3  . DEXILANT 60 MG capsule TAKE ONE CAPSULE BY MOUTH ONE TIME DAILY 30 capsule 6  . diltiazem (CARTIA XT) 120 MG 24 hr capsule Take 1 capsule (120 mg total) by mouth daily. 30 capsule 1  . estradiol (ESTRACE) 2 MG tablet Take 2 mg by mouth at bedtime.     [name]  fenofibrate (TRICOR) 145 MG tablet TAKE ONE TABLET BY MOUTH ONE TIME DAILY  (Patient taking differently: TAKE ONE TABLET BY MOUTH every evening) 30 tablet 10  . fluticasone (FLONASE) 50 MCG/ACT nasal spray USE TWO SPRAYS IN EACH NOSTRIL DAILY 16 g 4  . furosemide (LASIX) 40 MG tablet Take 1 tablet (40 mg total) by mouth 2 (two) times daily. PATIENT NEEDS TO CONTACT OFFICE FOR ADDITIONAL REFILLS 60 tablet 0  . gabapentin (NEURONTIN) 100 MG capsule Take 1 capsule (100 mg total) by mouth 3 (three) times daily. **PT NEEDS AN APPT FOR FURTHER REFILLS** 90 capsule 1  . glucose blood (ONE TOUCH ULTRA TEST) test strip 1 each by Other route 3 (three) times daily. Use as instructed 100 each 5  .  insulin aspart (NOVOLOG FLEXPEN) 100 UNIT/ML FlexPen Inject 7-12 Units into the skin 3 (three) times daily with meals. Per sliding scale    . Insulin Pen Needle 31G X 8 MM MISC Use to inject insulin 4 times daily. 130 each 2  . losartan (COZAAR) 50 MG tablet Take 1 tablet (50 mg total) by mouth daily. (Patient taking differently: Take 100 mg by mouth daily. ) 90 tablet 3  . metFORMIN (GLUCOPHAGE) 1000 MG tablet TAKE ONE TABLET BY MOUTH TWICE DAILY with a meal. 180 tablet 2  . mometasone-formoterol (DULERA) 200-5 MCG/ACT AERO Inhale 2 puffs into the lungs 2 (two) times daily. 13 g 5  . PARoxetine Mesylate (BRISDELLE) 7.5 MG CAPS Take 7.5 mg by mouth daily.    . potassium chloride SA (K-DUR,KLOR-CON) 20 MEQ tablet Take 1 tablet (20 mEq total) by mouth 3 (three) times daily. 90 tablet 1  . predniSONE (DELTASONE) 5 MG tablet Take 8 tabs orally by mouth for 2 days, then take 6 tabs orally by mouth for 2 days, then take 4 tabs orally by mouth for 2 days, then take 2 tabs orally by mouth for 2 days, then continue your regular dose of 5 mg. 90 tablet 0  . terbinafine (LAMISIL) 250 MG tablet Take 1 tablet (250 mg total) by mouth daily. 30 tablet 3  . TOUJEO SOLOSTAR 300 UNIT/ML SOPN INJECT 40 UNITS INTO THE SKIN AT BEDTIME 13.5 mL 1  . venlafaxine XR (EFFEXOR-XR) 75 MG 24 hr capsule Take 1 capsule (75 mg total) by mouth 2 (two) times daily. 180 capsule 3  . vitamin B-12 (CYANOCOBALAMIN) 1000 MCG tablet Take 1,000 mcg by mouth daily.    [name] acetaminophen (TYLENOL) 325 MG tablet Take 650 mg by mouth every 6 (six) hours as needed for headache. Reported on 11/12/2015    . albuterol (PROVENTIL HFA;VENTOLIN HFA) 108 (90 BASE) MCG/ACT inhaler Inhale 1-2 puffs into the lungs every 6 (six) hours as needed for wheezing or shortness of breath. (Patient not taking: Reported on 10/31/2015) 8 g 5  . HYDROcodone-acetaminophen (NORCO) 10-325 MG per tablet Take 1-2 tablets by mouth every 4 (four) hours as needed for moderate pain.  Reported on 11/12/2015  0  . nitroGLYCERIN (NITROSTAT) 0.4 MG SL tablet Place 0.4 mg under the tongue every 5 (five) minutes as needed. Reported on 11/12/2015    . ondansetron (ZOFRAN-ODT) 4 MG disintegrating tablet Take 4 mg by mouth every 8 (eight) hours as needed for nausea or vomiting. Reported on 11/12/2015  0  . promethazine (PHENERGAN) 25 MG tablet Take 1 tablet (25 mg total) by mouth every 6 (six) hours as needed for nausea or vomiting. (Patient not taking: Reported on 10/31/2015) 15 tablet 0  No current facility-administered medications for this visit.    Objective: BP 138/62 mmHg  Pulse 84  Temp(Src) 97.9 F (36.6 C)  Wt 255 lb (115.667 kg) Gen: NAD, resting comfortably CV: RRR no murmurs rubs or gallops Lungs: CTAB no crackles, wheeze, rhonchi Abdomen: soft/minimal suprapubic and flank pain//nondistended/normal bowel sounds. No rebound or guarding. obese Ext: 1+ edema L >R  Skin: warm, dry Neuro: grossly normal, moves all extremities  Assessment/Plan:  Chronic diastolic heart failure, NYHA class 2 ([facility]) S: Patient states she has had some more shortness of breath with lying down despite continued lasix. Sleeps on 3 pillows- no recent increase. She is not sure how much lasix she is stating- rx would indicate did not have enough to take BID or daily A/P: refilled rx and has cardiology follow up next week. Per avs on how to increase lasix for next 3 days- she does seem to have some increased edema as well as reported orthopnea increase.   Concern for sinusitis/otitis media as well as for UTI S:Started with sinus pressure and right ear pain on Monday. Nasal congestion but nothing coming out yet. Has not tried OTC products. Mild shortness of breath, some more with laying down than normal- still sleeping on 3 pillows though. No sputum production.   Yesterday started with frequent urination and dysuria as well as some suprapubic pain. ROS- No fever/chills/nausea/vomiting. Also see HPI.  Mild SOB but mainly worse with lying down.  A/P: I do think patient is about day 3 into viral sinusitis. Given how she has quickly become sick in past, hesitant to not treat aggressively (has required antibiotics for initially apparently viral disease). Her UA is concerning for UTI as well. Given patient has diabetes and frequent prednisone use for lung condition- hesitant to use. Hesitant to use antibiotics as has had recurrent need in past. We ultimately decided to try to use urine culture to cotreat UTI as well as potential bacterial sinusitis. Symptomatic care with mucinex in meantime.   See avs. Patient to call early Friday morning (try to await urine culture results to decide on antibiotic) with sooner Return precautions advised.   Orders Placed This Encounter  Procedures  . Culture, Urine  . POC Urinalysis Dipstick    Meds ordered this encounter  Medications  . furosemide (LASIX) 40 MG tablet    Sig: Take 1 tablet (40 mg total) by mouth 2 (two) times daily. PATIENT NEEDS TO CONTACT OFFICE FOR ADDITIONAL REFILLS    Dispense:  60 tablet    Refill:  0

## 2015-11-13 ENCOUNTER — Ambulatory Visit: Payer: BLUE CROSS/BLUE SHIELD | Admitting: Internal Medicine

## 2015-11-14 ENCOUNTER — Telehealth: Payer: Self-pay | Admitting: Family Medicine

## 2015-11-14 LAB — URINE CULTURE: Colony Count: 25000

## 2015-11-14 MED ORDER — AMOXICILLIN-POT CLAVULANATE 875-125 MG PO TABS: 1.0000 | ORAL_TABLET | Freq: Two times a day (BID) | ORAL | Status: DC

## 2015-11-14 NOTE — Telephone Encounter (Signed)
Sent in augmentin for sinusitis. Also having worsening ear pain- could have otitis media. UTI symptoms stable- multiple bacterial morphotype and not true UTI- augmentin would give some coverage as well

## 2015-11-14 NOTE — Telephone Encounter (Signed)
Calling for her UA results and to let you know that her URI is not any better either. Her sx are the same but she does not feel well at all.

## 2015-11-14 NOTE — Telephone Encounter (Signed)
See below, spoke with pt and informed her that we do not have culture back yet and she wants to know if you can go a head and put her on something instead of waiting for the culture to return.

## 2015-11-20 ENCOUNTER — Ambulatory Visit (INDEPENDENT_AMBULATORY_CARE_PROVIDER_SITE_OTHER): Payer: BLUE CROSS/BLUE SHIELD | Admitting: Cardiology

## 2015-11-20 ENCOUNTER — Encounter: Payer: Self-pay | Admitting: Cardiology

## 2015-11-20 VITALS — BP 130/80 | HR 76 | Ht 66.0 in | Wt 254.0 lb

## 2015-11-20 DIAGNOSIS — I5032 Chronic diastolic (congestive) heart failure: Secondary | ICD-10-CM | POA: Diagnosis not present

## 2015-11-20 DIAGNOSIS — I10 Essential (primary) hypertension: Secondary | ICD-10-CM | POA: Diagnosis not present

## 2015-11-20 DIAGNOSIS — E785 Hyperlipidemia, unspecified: Secondary | ICD-10-CM | POA: Diagnosis not present

## 2015-11-20 MED ORDER — LOSARTAN POTASSIUM 100 MG PO TABS: 100.0000 mg | ORAL_TABLET | Freq: Every day | ORAL | Status: DC

## 2015-11-20 MED ORDER — CARVEDILOL 25 MG PO TABS: 25.0000 mg | ORAL_TABLET | Freq: Two times a day (BID) | ORAL | Status: DC

## 2015-11-20 NOTE — Patient Instructions (Signed)
INCREASE LOSARTAN 100 MG ONE DAILY   NO  OTHER CHANGE WITH CURRENT MEDICATIONS   Your physician wants you to follow-up in [place] 2017.  You will receive a reminder letter in the mail two months in advance. If you don't receive a letter, please call our office to schedule the follow-up appointment.  If you need a refill on your cardiac medications before your next appointment, please call your pharmacy.

## 2015-11-20 NOTE — Progress Notes (Signed)
PCP: Garret Reddish, MD  Clinic Note: Chief Complaint  Patient presents with  . Follow-up    no chest pain, some shortness of breath, some swelling, no cramping, no dizziness or lightheadedness  . Hypertension  . Congestive Heart Failure    Mild HF PF    HPI: [name] is a 55 y.o. female with a history of pulmonary sarcoidosis and mild diastolic heart failure below who presents today for f/u of ? HFPF.  [name] was last seen for R&LHC in 2014 - [name] never did have a follow-up appointment after that visit.  Right heart cath pressures were essentially normal. Cardiac output was slightly reduced at 4.9 with an index of 2.19.  Angiographically normal coronary arteries  Recent Hospitalizations: Jan 2-7, 2017 - COPD/Bronchitis/Sarcoid flair. --[name] has had several issues since her last visit here with multiple different surgeries. [name] had a MRSA infection of one of her operative sites. Throughout all these episodes, [name] has been relatively stable cardiac standpoint.   Studies Reviewed:   Echo Nov 2015: 55-60%, Gr 1 DD.  Normal LV size with mild LV hypertrophy. Normal RV size and systolic function. No significant valvular abnormalities.  Interval History: [name] presents today really just to get follow-up and to refill medications. [name] needed refills for her carvedilol. Overall, [name] is pretty stable cardiac standpoint. [name] has her baseline dyspnea from her pulmonary disease, denies any significant anginal type chest tightness or pressure. [name] does have a little orthopnea, but no significant PND. [name] really doesn't have any sneezing coughing anymore, since her most recent flare. That seems to be resolved, but [name] still has some wheezing.  No palpitations, lightheadedness, dizziness, weakness or syncope/near syncope. No TIA/amaurosis fugax symptoms. No claudication.  ROS: A comprehensive was performed. Review of Systems  Constitutional: Positive for malaise/fatigue (At  baseline). Negative for fever and chills.  HENT: Positive for congestion. Negative for nosebleeds.   Respiratory: Positive for cough, shortness of breath and wheezing. Negative for sputum production.        Recovering from recent bronchitis/COPD/sarcoid flare  Cardiovascular: Negative.  Leg swelling: minimal.  Gastrointestinal: Positive for constipation. Negative for heartburn, abdominal pain, blood in stool and melena.  Musculoskeletal: Positive for back pain and joint pain. Negative for falls.  Neurological: Negative.   Endo/Heme/Allergies: Does not bruise/bleed easily.  Psychiatric/Behavioral: Negative.   All other systems reviewed and are negative.   Past Medical History  Diagnosis Date  . DIABETES MELLITUS, TYPE II 08/21/2006  . HYPERLIPIDEMIA 08/21/2006  . GOUT 08/20/2010  . OBESITY 06/04/2009  . ANEMIA-UNSPECIFIED 09/18/2009  . HYPERTENSION 08/21/2006  . GERD 08/21/2006  . Internal hemorrhoids   . Pulmonary sarcoidosis ([place])     Followed locally by pulmonology, but also by Dr. Casper Harrison at [facility]  . Exertional chest pain     sharp, substernal, exertional  . Vocal cord dysfunction   . Chronic diastolic heart failure, NYHA class 2 (HCC)     LVEDP roughly 20% by cath  . CHF (congestive heart failure) ([place])   . COPD (chronic obstructive pulmonary disease) ([place])   . Depression   . Fracture of 5th metatarsal     non union  . Abnormal SPEP 04/17/2014  . Osteomyelitis of left foot ([place]) 05/29/2015  . Diabetic osteomyelitis ([place]) 05/29/2015  . Hx of umbilical hernia repair   . Infection of wound due to methicillin resistant Staphylococcus aureus (MRSA)   . Wears partial dentures   . Onychomycosis 10/27/2015  Past Surgical History  Procedure Laterality Date  . Ventral hernia repair    . Nissen fundoplication  [id number]  . Cholecystectomy  1984  . Abdominal hysterectomy    . Knee arthroscopy      right  . Tubal ligation      with reversal in 1994  . Bladder  suspension  11/11/2011    Procedure: TRANSVAGINAL TAPE (TVT) PROCEDURE;  Surgeon: Olga Millers, MD;  Location: [facility];  Service: Gynecology;  Laterality: N/A;  . Cystoscopy  11/11/2011    Procedure: CYSTOSCOPY;  Surgeon: Olga Millers, MD;  Location: [facility];  Service: Gynecology;  Laterality: N/A;  . Doppler echocardiography  02/12/2013    LV FUNCTION, SIZE NORMAL; MILD CONCENTRIC LVH; EST EF 55-65%; WALL MOTION NORMAL  . Lexiscan myoview  03/09/2013    EF 50%; NORMAL MYOCARDIAL PERFUSION STUDY - breast attenuation  . Cardiac catheterization  07/2010    LVEF 50-55% WITH VERY MILD GLOBAL HYPOKINESIA; ESSENTIALLY NORMAL CORONARY ARTERIES; NORMAL LV FUNCTION  . Carotids  02/18/11    CAROTID DUPLEX; VERTEBRALS ARE PATENT WITH ANTEGRADE FLOW. ICA/CCA RATIO 1.61 ON RIGHT AND 0.75 ON LEFT  . Right and left cardiac catheterization  04/23/2013    Angiographic normal coronaries; LVEDP 20 mmHg, PCWP 12-14 mmHg, RAP 12 mmHg.; Fick CO/CI 4.9/2.2  . Metatarsal osteotomy with open reduction internal fixation (orif) metatarsal with fusion Left 04/09/2014    Procedure: LEFT FOOT FRACTURE OPEN TREATMENT METATARSAL INCLUDES INTERNAL FIXATION EACH;  Surgeon: Lorn Junes, MD;  Location: [facility];  Service: Orthopedics;  Laterality: Left;  . Left and right heart catheterization with coronary angiogram N/A 04/23/2013    Procedure: LEFT AND RIGHT HEART CATHETERIZATION WITH CORONARY ANGIOGRAM;  Surgeon: Leonie Man, MD;  Location: [facility];  Service: Cardiovascular;  Laterality: N/A;  . Hernia repair    . Appendectomy    . Fracture surgery    . I&d extremity Left 06/27/2015    Procedure: Partial Excision Left Calcaneus, Place Antibiotic Beads, and Wound VAC;  Surgeon: Newt Minion, MD;  Location: [place] [facility];  Service: Orthopedics;  Laterality: Left;    Prior to Admission medications   Medication Sig Start Date End Date Taking? Authorizing Provider  acetaminophen (TYLENOL) 325 MG tablet Take 650  mg by mouth every 6 (six) hours as needed for headache. Reported on 11/12/2015   Yes Historical Provider, MD  albuterol (PROVENTIL HFA;VENTOLIN HFA) 108 (90 BASE) MCG/ACT inhaler Inhale 1-2 puffs into the lungs every 6 (six) hours as needed for wheezing or shortness of breath. 09/25/15  Yes Praveen Mannam, MD  allopurinol (ZYLOPRIM) 100 MG tablet Take 100 mg by mouth at bedtime.    Yes Historical Provider, MD  amoxicillin-clavulanate (AUGMENTIN) 875-125 MG tablet Take 1 tablet by mouth 2 (two) times daily. 11/14/15  Yes [name], MD  aspirin EC 81 MG tablet Take 81 mg by mouth daily.    Yes Historical Provider, MD  atorvastatin (LIPITOR) 40 MG tablet TAKE ONE TABLET BY MOUTH ONE TIME DAILY 05/06/15  Yes [name], MD  B-D ULTRAFINE III SHORT PEN 31G X 8 MM MISC FOLLOW DIRECTIONS PROVIDED BY PHYSICIAN 05/06/15  Yes [name], MD  buPROPion (WELLBUTRIN XL) 300 MG 24 hr tablet Take 1 tablet (300 mg total) by mouth daily. 10/03/15  Yes [name], MD  carvedilol (COREG) 25 MG tablet Take 1 tablet (25 mg total) by mouth 2 (two) times daily with a meal. MUST  KEEP APPOINTMENT 11/20/2015 WITH DR Ellyn Hack FOR FUTURE REFILLS 10/08/15  Yes Leonie Man, MD  cholecalciferol 5000 UNITS TABS Take 1 tablet (5,000 Units total) by mouth daily. Patient taking differently: Take 5,000 Units by mouth every morning.  04/09/14  Yes Kirstin Shepperson, PA-C  DEXILANT 60 MG capsule TAKE ONE CAPSULE BY MOUTH ONE TIME DAILY 03/14/15  Yes Elsie Stain, MD  diltiazem (CARTIA XT) 120 MG 24 hr capsule Take 1 capsule (120 mg total) by mouth daily. 10/21/15  Yes Leonie Man, MD  estradiol (ESTRACE) 2 MG tablet Take 2 mg by mouth at bedtime.  01/24/14  Yes Historical Provider, MD  fenofibrate (TRICOR) 145 MG tablet TAKE ONE TABLET BY MOUTH ONE TIME DAILY  Patient taking differently: TAKE ONE TABLET BY MOUTH every evening 11/05/14  Yes Leonie Man, MD  fluticasone [facility]) 50 MCG/ACT nasal spray USE TWO  SPRAYS IN [facility] NOSTRIL DAILY 02/03/15  Yes [name], MD  furosemide (LASIX) 40 MG tablet Take 1 tablet (40 mg total) by mouth 2 (two) times daily. PATIENT NEEDS TO CONTACT OFFICE FOR ADDITIONAL REFILLS 11/12/15  Yes [name], MD  gabapentin (NEURONTIN) 100 MG capsule Take 1 capsule (100 mg total) by mouth 3 (three) times daily. **PT NEEDS AN APPT FOR FURTHER REFILLS** 07/21/15  Yes Philemon Kingdom, MD  glucose blood (ONE TOUCH ULTRA TEST) test strip 1 each by Other route 3 (three) times daily. Use as instructed 10/19/13  Yes Bruce Kendall Flack, MD  insulin aspart (NOVOLOG FLEXPEN) 100 UNIT/ML FlexPen Inject 7-12 Units into the skin 3 (three) times daily with meals. Per sliding scale   Yes Historical Provider, MD  Insulin Pen Needle 31G X 8 MM MISC Use to inject insulin 4 times daily. 04/30/15  Yes Philemon Kingdom, MD  losartan (COZAAR) 50 MG tablet Take 1 tablet (50 mg total) by mouth daily. Patient taking differently: Take 100 mg by mouth daily.  08/08/15  Yes [name], MD  metFORMIN (GLUCOPHAGE) 1000 MG tablet TAKE ONE TABLET BY MOUTH TWICE DAILY with a meal. 12/29/14  Yes Philemon Kingdom, MD  mometasone-formoterol (DULERA) 200-5 MCG/ACT AERO Inhale 2 puffs into the lungs 2 (two) times daily. 09/25/15  Yes Praveen Mannam, MD  nitroGLYCERIN (NITROSTAT) 0.4 MG SL tablet Place 0.4 mg under the tongue every 5 (five) minutes as needed. Reported on 11/12/2015   Yes Historical Provider, MD  ondansetron (ZOFRAN-ODT) 4 MG disintegrating tablet Take 4 mg by mouth every 8 (eight) hours as needed for nausea or vomiting. Reported on 11/12/2015 03/31/15  Yes Historical Provider, MD  PARoxetine Mesylate (BRISDELLE) 7.5 MG CAPS Take 7.5 mg by mouth daily.   Yes Historical Provider, MD  potassium chloride SA (K-DUR,KLOR-CON) 20 MEQ tablet Take 1 tablet (20 mEq total) by mouth 3 (three) times daily. 05/01/15  Yes Thurnell Lose, MD  predniSONE (DELTASONE) 5 MG tablet Take 8 tabs orally by mouth for 2 days,  then take 6 tabs orally by mouth for 2 days, then take 4 tabs orally by mouth for 2 days, then take 2 tabs orally by mouth for 2 days, then continue your regular dose of 5 mg. 10/18/15  Yes Verlee Monte, MD  promethazine (PHENERGAN) 25 MG tablet Take 1 tablet (25 mg total) by mouth every 6 (six) hours as needed for nausea or vomiting. 10/04/15  Yes Stevi Barrett, PA-C  terbinafine (LAMISIL) 250 MG tablet Take 1 tablet (250 mg total) by mouth daily. 10/27/15  Yes Lavell Islam  Tommy Medal, MD  TOUJEO SOLOSTAR 300 UNIT/ML SOPN INJECT 40 UNITS INTO THE SKIN AT BEDTIME 10/29/15  Yes Philemon Kingdom, MD  venlafaxine XR (EFFEXOR-XR) 75 MG 24 hr capsule Take 1 capsule (75 mg total) by mouth 2 (two) times daily. 10/03/15  Yes [name], MD  vitamin B-12 (CYANOCOBALAMIN) 1000 MCG tablet Take 1,000 mcg by mouth daily.   Yes Historical Provider, MD   Allergies  Allergen Reactions  . Vancomycin Other (See Comments)    nephrotoxicity  . Chlorhexidine Itching  . Lisinopril Cough  . Methotrexate Other (See Comments)     peri-oral and buccal lesions.  . Clindamycin/Lincomycin Nausea And Vomiting and Rash  . Doxycycline Rash  . Teflaro [Ceftaroline] Rash    Social History   Social History  . Marital Status: Married    Spouse Name: KATRYN BABY  . Number of Children: 2  . Years of Education: 12   Occupational History  . DISABLED    Social History Main Topics  . Smoking status: Never Smoker   . Smokeless tobacco: Never Used  . Alcohol Use: No  . Drug Use: No  . Sexual Activity: Yes    Birth Control/ Protection: Surgical   Other Topics Concern  . None   Social History Narrative   Married 1994. 2 sons who both live close and 1 grandson.       Disability due to sarcoidosis. 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 Diabetes Father   . Heart attack Father   . Coronary artery disease Father   . Heart failure Father   . COPD Mother   . Emphysema Mother   . Asthma Mother   . Heart failure Mother   . Sarcoidosis Maternal Uncle   . Colon cancer Neg Hx   . Lung cancer Brother   . Cancer Brother   . Diabetes Brother   . Heart attack Maternal Grandfather      Wt Readings from Last 3 Encounters:  11/20/15 254 lb (115.214 kg)  11/12/15 255 lb (115.667 kg)  10/31/15 256 lb (116.121 kg)    PHYSICAL EXAM BP 130/80 mmHg  Pulse 76  Ht 5\' 6"  (1.676 m)  Wt 254 lb (115.214 kg)  BMI 41.02 kg/m2 General appearance: A&O x3, cooperative, appears stated age, no distress and morbidly obese, with very thick neck making jugular venous distention impossible to assess. Neck: no carotid bruit, supple, symmetrical, trachea midline, thyroid not enlarged, symmetric, no tenderness/mass/nodules and Unable to really assess JVD due to her body habitus was obese neck.  Lungs:CTA B. Mild tubular breath sound with overall diminished air movement. [name] has diffuse expiratory wheezing. normal percussion bilaterally and Nonlabored - but does have accessory muscle use.  Heart: regular rate and rhythm, S1, S2 normal, no murmur, click, rub or gallop and Unable to palpate apical impulse  Abdomen: soft, non-tender; bowel sounds normal; no masses; Significant truncal obesity, unable to palpate HSM or determine HJR  Extremities: edema 1-2+ bilaterally, varicose veins noted and No erythema or rash. Small healing stasis ulcers  Pulses: 2+ and symmetric   Adult ECG Report Not checked  Other studies Reviewed: Additional studies/ records that were reviewed today include:  Recent Labs:  No new lipid panel. Lab Results  Component Value Date  CREATININE 0.89 10/16/2015    ASSESSMENT / PLAN: Problem List Items Addressed This Visit    Hyperlipidemia (Chronic)    Biggest issue during her last evaluation for her  lipids was that [name] had significant hypertriglyceridemia. As [name] is therefore on high-dose TriCor plus atorvastatin.  Recommendation would be if there is not adequate control, I would switch to Crestor from atorvastatin.       Relevant Medications   carvedilol (COREG) 25 MG tablet   losartan (COZAAR) 100 MG tablet   Essential hypertension (Chronic)    [name] is on a pretty stable regimen. I would like a little bit more afterload reduction, and therefore, increase her losartan to 100 mg. If necessary we could potentially even get rid of the diltiazem.      Relevant Medications   carvedilol (COREG) 25 MG tablet   losartan (COZAAR) 100 MG tablet   Chronic diastolic heart failure, NYHA class 2 (HCC) - Primary (Chronic)    I would suspect that a good portion of her "heart failure symptoms" are more related to her pulmonary condition. [name] is on carvedilol at max dose which would refill. [name] is also on losartan which I will increase to 100 mg to provide more afterload reduction. [name] is on twice a day Lasix with when necessary for worsening edema or orthopnea symptoms.  Partially because [name] was told that I need to be the one refill her available, I will see her back in December. I do think however that [name] could be managed by her primary physician who is managing her other medical issues. [name] is not having that much in the way of true cardiac symptoms.      Relevant Medications   carvedilol (COREG) 25 MG tablet   losartan (COZAAR) 100 MG tablet      Current medicines are reviewed at length with the patient today. (+/- concerns) none The following changes have been made:  INCREASE LOSARTAN 100 MG ONE DAILY   NO  OTHER CHANGE WITH CURRENT MEDICATIONS   Your physician wants you to follow-up in [place] 2017   Studies Ordered:   No orders of the defined types were placed in this encounter.      Leonie Man, M.D., M.S. Interventional Cardiologist   Pager # [phone] Phone  # [phone] [address] [place], [place] [address]

## 2015-11-22 ENCOUNTER — Encounter: Payer: Self-pay | Admitting: Cardiology

## 2015-11-22 NOTE — Assessment & Plan Note (Signed)
Biggest issue during her last evaluation for her lipids was that she had significant hypertriglyceridemia. As she is therefore on high-dose TriCor plus atorvastatin.  Recommendation would be if there is not adequate control, I would switch to Crestor from atorvastatin.

## 2015-11-22 NOTE — Assessment & Plan Note (Signed)
She is on a pretty stable regimen. I would like a little bit more afterload reduction, and therefore, increase her losartan to 100 mg. If necessary we could potentially even get rid of the diltiazem.

## 2015-11-22 NOTE — Assessment & Plan Note (Addendum)
I would suspect that a good portion of her "heart failure symptoms" are more related to her pulmonary condition. She is on carvedilol at max dose which would refill. She is also on losartan which I will increase to 100 mg to provide more afterload reduction. She is on twice a day Lasix with when necessary for worsening edema or orthopnea symptoms.  Partially because she was told that I need to be the one refill her available, I will see her back in December. I do think however that she could be managed by her primary physician who is managing her other medical issues. She is not having that much in the way of true cardiac symptoms.

## 2015-11-24 ENCOUNTER — Ambulatory Visit (INDEPENDENT_AMBULATORY_CARE_PROVIDER_SITE_OTHER): Payer: BLUE CROSS/BLUE SHIELD | Admitting: Internal Medicine

## 2015-11-24 ENCOUNTER — Encounter: Payer: Self-pay | Admitting: Internal Medicine

## 2015-11-24 VITALS — BP 118/58 | HR 84 | Ht 66.0 in | Wt 254.0 lb

## 2015-11-24 DIAGNOSIS — R109 Unspecified abdominal pain: Secondary | ICD-10-CM | POA: Diagnosis not present

## 2015-11-24 DIAGNOSIS — R1310 Dysphagia, unspecified: Secondary | ICD-10-CM

## 2015-11-24 DIAGNOSIS — K219 Gastro-esophageal reflux disease without esophagitis: Secondary | ICD-10-CM | POA: Diagnosis not present

## 2015-11-24 DIAGNOSIS — B370 Candidal stomatitis: Secondary | ICD-10-CM

## 2015-11-24 MED ORDER — HYOSCYAMINE SULFATE 0.125 MG SL SUBL: SUBLINGUAL_TABLET | SUBLINGUAL | Status: DC

## 2015-11-24 MED ORDER — FLUCONAZOLE 100 MG PO TABS: ORAL_TABLET | ORAL | Status: DC

## 2015-11-24 NOTE — Patient Instructions (Signed)
We have sent the following medications to your pharmacy for you to pick up at your convenience:  Diflucan, Levsin

## 2015-11-24 NOTE — Progress Notes (Signed)
HISTORY OF PRESENT ILLNESS:  [name] is a 55 y.o. female innumerable significant medical problems as listed below. She presents today with a chief complaint of severe heartburn 2 weeks duration despite excellent therapy twice daily. Has also added Gaviscon and Carafate without improvement. Does complain of discomfort with swallowing but no dysphagia. Has diabetes mellitus and is on prednisone for sarcoidosis. Did complain of esophageal burning and dysphagia despite PPI therapy December 2013. Subsequently underwent upper endoscopy with esophageal dilation. She also complains of mid abdominal stabbing discomfort of 2 weeks' duration. Occurs 2-3 times per day and last approximate 5 minutes. Not associated with meals, activity, or defecation. She did undergo complete colonoscopy in 2011 which was normal except for melanosis and hemorrhoids. No GI bleeding. She is chronically anemic. She did have complete CT scan November 2015 with marked hepatic steatosis but no other significant abnormalities. She is status post cholecystectomy and prior fundoplication for GERD. Hospitalized last month with COPD exacerbation  REVIEW OF SYSTEMS:  All non-GI ROS negative except for visual change, shortness of breath  Past Medical History  Diagnosis Date  . DIABETES MELLITUS, TYPE II 08/21/2006  . HYPERLIPIDEMIA 08/21/2006  . GOUT 08/20/2010  . OBESITY 06/04/2009  . ANEMIA-UNSPECIFIED 09/18/2009  . HYPERTENSION 08/21/2006  . GERD 08/21/2006  . Internal hemorrhoids   . Pulmonary sarcoidosis ([place])     Followed locally by pulmonology, but also by Dr. Casper Harrison at [facility]  . Exertional chest pain     sharp, substernal, exertional  . Vocal cord dysfunction   . Chronic diastolic heart failure, NYHA class 2 (HCC)     LVEDP roughly 20% by cath  . CHF (congestive heart failure) ([place])   . COPD (chronic obstructive pulmonary disease) ([place])   . Depression   . Fracture of 5th metatarsal     non union   . Abnormal SPEP 04/17/2014  . Osteomyelitis of left foot ([place]) 05/29/2015  . Diabetic osteomyelitis ([place]) 05/29/2015  . Hx of umbilical hernia repair   . Infection of wound due to methicillin resistant Staphylococcus aureus (MRSA)   . Wears partial dentures   . Onychomycosis 10/27/2015    Past Surgical History  Procedure Laterality Date  . Ventral hernia repair    . Nissen fundoplication  123XX123  . Cholecystectomy  1984  . Abdominal hysterectomy    . Knee arthroscopy      right  . Tubal ligation      with reversal in 1994  . Bladder suspension  11/11/2011    Procedure: TRANSVAGINAL TAPE (TVT) PROCEDURE;  Surgeon: Olga Millers, MD;  Location: [place] [facility];  Service: Gynecology;  Laterality: N/A;  . Cystoscopy  11/11/2011    Procedure: CYSTOSCOPY;  Surgeon: Olga Millers, MD;  Location: [place] [facility];  Service: Gynecology;  Laterality: N/A;  . Doppler echocardiography  02/12/2013    LV FUNCTION, SIZE NORMAL; MILD CONCENTRIC LVH; EST EF 55-65%; WALL MOTION NORMAL  . Lexiscan myoview  03/09/2013    EF 50%; NORMAL MYOCARDIAL PERFUSION STUDY - breast attenuation  . Cardiac catheterization  07/2010    LVEF 50-55% WITH VERY MILD GLOBAL HYPOKINESIA; ESSENTIALLY NORMAL CORONARY ARTERIES; NORMAL LV FUNCTION  . Carotids  02/18/11    CAROTID DUPLEX; VERTEBRALS ARE PATENT WITH ANTEGRADE FLOW. ICA/CCA RATIO 1.61 ON RIGHT AND 0.75 ON LEFT  . Right and left cardiac catheterization  04/23/2013    Angiographic normal coronaries; LVEDP 20 mmHg, PCWP 12-14 mmHg, RAP 12 mmHg.; Fick CO/CI 4.9/2.2  . Metatarsal  osteotomy with open reduction internal fixation (orif) metatarsal with fusion Left 04/09/2014    Procedure: LEFT FOOT FRACTURE OPEN TREATMENT METATARSAL INCLUDES INTERNAL FIXATION EACH;  Surgeon: Lorn Junes, MD;  Location: [place];  Service: Orthopedics;  Laterality: Left;  . Left and right heart catheterization with coronary angiogram N/A 04/23/2013    Procedure: LEFT AND RIGHT HEART  CATHETERIZATION WITH CORONARY ANGIOGRAM;  Surgeon: Leonie Man, MD;  Location: [facility] CATH LAB;  Service: Cardiovascular;  Laterality: N/A;  . Hernia repair    . Appendectomy    . Fracture surgery      foot  . I&d extremity Left 06/27/2015    Procedure: Partial Excision Left Calcaneus, Place Antibiotic Beads, and Wound VAC;  Surgeon: Newt Minion, MD;  Location: [place];  Service: Orthopedics;  Laterality: Left;    Social History KINDA OEN  reports that she has never smoked. She has never used smokeless tobacco. She reports that she does not drink alcohol or use illicit drugs.  family history includes Asthma in her mother; COPD in her mother; Coronary artery disease in her father; Diabetes in her brother and father; Emphysema in her mother; Heart attack in her father and maternal grandfather; Heart failure in her father and mother; Lung cancer in her brother; Sarcoidosis in her maternal uncle. There is no history of Colon cancer.  Allergies  Allergen Reactions  . Vancomycin Other (See Comments)    nephrotoxicity  . Chlorhexidine Itching  . Lisinopril Cough  . Methotrexate Other (See Comments)     peri-oral and buccal lesions.  . Clindamycin/Lincomycin Nausea And Vomiting and Rash  . Doxycycline Rash  . Teflaro [Ceftaroline] Rash       PHYSICAL EXAMINATION: Vital signs: BP 118/58 mmHg  Pulse 84  Ht 5\' 6"  (1.676 m)  Wt 254 lb (115.214 kg)  BMI 41.02 kg/m2  Constitutional: Obese, unwell-appearing, no acute distress Psychiatric: Pleasant, alert and oriented x3, cooperative Eyes: extraocular movements intact, anicteric, conjunctiva pink Mouth: oral pharynx moist, no lesions, but does have oral thrush Neck: supple no lymphadenopathy Cardiovascular: heart regular rate and rhythm, no murmur Lungs: clear to auscultation bilaterally Abdomen: soft, obese, nontender, nondistended, no obvious ascites, no peritoneal signs, normal bowel sounds, no organomegaly Rectal:  Omitted Extremities: no clubbing cyanosis or significant lower extremity edema bilaterally Skin: no lesions on visible extremities Neuro: No focal deficits. Normal DTRs. No asterixis.   ASSESSMENT:  #1. Complaints of esophageal burning despite high-dose PPI. Suspect esophageal candidiasis. Multiple risk factors   #2. Oral thrush   #3. Fleeting abdominal pain of 2 weeks' duration as described. Consistent with benign spasm #4. GERD #5. Multiple medical problems   PLAN:   #1. Prescribe Diflucan 200 mg on day 1 and then 100 mg daily for one week then stop   #2. Prescribe Levsin sublingual 0.125 mg. 1-2 every 4 hours as needed for mid abdominal pain   #3. Reflux precautions with attention to weight loss  #4. Ongoing general medical care with primary providers and multiple specialists  25 minutes spent face-to-face with the patient. Greater than 50% of the time spent counseling regarding the above clinical impression and plans

## 2015-11-26 ENCOUNTER — Other Ambulatory Visit: Payer: Self-pay | Admitting: Internal Medicine

## 2015-11-30 ENCOUNTER — Emergency Department (HOSPITAL_COMMUNITY): Payer: BLUE CROSS/BLUE SHIELD

## 2015-11-30 ENCOUNTER — Encounter (HOSPITAL_COMMUNITY): Payer: Self-pay | Admitting: *Deleted

## 2015-11-30 ENCOUNTER — Inpatient Hospital Stay (HOSPITAL_COMMUNITY)
Admission: EM | Admit: 2015-11-30 | Discharge: 2015-12-05 | DRG: 191 | Disposition: A | Discharge status: Home / Self Care | Payer: BLUE CROSS/BLUE SHIELD | Attending: Internal Medicine | Admitting: Internal Medicine

## 2015-11-30 DIAGNOSIS — E1169 Type 2 diabetes mellitus with other specified complication: Secondary | ICD-10-CM | POA: Diagnosis present

## 2015-11-30 DIAGNOSIS — J209 Acute bronchitis, unspecified: Secondary | ICD-10-CM | POA: Diagnosis present

## 2015-11-30 DIAGNOSIS — Z79899 Other long term (current) drug therapy: Secondary | ICD-10-CM

## 2015-11-30 DIAGNOSIS — Z794 Long term (current) use of insulin: Secondary | ICD-10-CM

## 2015-11-30 DIAGNOSIS — J441 Chronic obstructive pulmonary disease with (acute) exacerbation: Secondary | ICD-10-CM | POA: Diagnosis present

## 2015-11-30 DIAGNOSIS — J440 Chronic obstructive pulmonary disease with acute lower respiratory infection: Secondary | ICD-10-CM | POA: Diagnosis not present

## 2015-11-30 DIAGNOSIS — I5032 Chronic diastolic (congestive) heart failure: Secondary | ICD-10-CM | POA: Diagnosis not present

## 2015-11-30 DIAGNOSIS — E785 Hyperlipidemia, unspecified: Secondary | ICD-10-CM | POA: Diagnosis present

## 2015-11-30 DIAGNOSIS — R05 Cough: Secondary | ICD-10-CM | POA: Diagnosis not present

## 2015-11-30 DIAGNOSIS — D869 Sarcoidosis, unspecified: Secondary | ICD-10-CM | POA: Diagnosis present

## 2015-11-30 DIAGNOSIS — I10 Essential (primary) hypertension: Secondary | ICD-10-CM | POA: Diagnosis present

## 2015-11-30 DIAGNOSIS — J029 Acute pharyngitis, unspecified: Secondary | ICD-10-CM | POA: Diagnosis not present

## 2015-11-30 DIAGNOSIS — R062 Wheezing: Secondary | ICD-10-CM | POA: Diagnosis not present

## 2015-11-30 DIAGNOSIS — J189 Pneumonia, unspecified organism: Secondary | ICD-10-CM

## 2015-11-30 DIAGNOSIS — R000 Tachycardia, unspecified: Secondary | ICD-10-CM | POA: Diagnosis not present

## 2015-11-30 DIAGNOSIS — J385 Laryngeal spasm: Secondary | ICD-10-CM | POA: Insufficient documentation

## 2015-11-30 DIAGNOSIS — R0602 Shortness of breath: Secondary | ICD-10-CM | POA: Diagnosis present

## 2015-11-30 DIAGNOSIS — Z7952 Long term (current) use of systemic steroids: Secondary | ICD-10-CM

## 2015-11-30 DIAGNOSIS — J383 Other diseases of vocal cords: Secondary | ICD-10-CM | POA: Diagnosis present

## 2015-11-30 DIAGNOSIS — K219 Gastro-esophageal reflux disease without esophagitis: Secondary | ICD-10-CM | POA: Diagnosis present

## 2015-11-30 DIAGNOSIS — E1165 Type 2 diabetes mellitus with hyperglycemia: Secondary | ICD-10-CM

## 2015-11-30 LAB — CBC
HCT: 30.6 % — ABNORMAL LOW (ref 36.0–46.0)
Hemoglobin: 9.8 g/dL — ABNORMAL LOW (ref 12.0–15.0)
MCH: 28.1 pg (ref 26.0–34.0)
MCHC: 32 g/dL (ref 30.0–36.0)
MCV: 87.7 fL (ref 78.0–100.0)
Platelets: 293 10*3/uL (ref 150–400)
RBC: 3.49 MIL/uL — ABNORMAL LOW (ref 3.87–5.11)
RDW: 14.7 % (ref 11.5–15.5)
WBC: 9.8 10*3/uL (ref 4.0–10.5)

## 2015-11-30 LAB — BASIC METABOLIC PANEL
Anion gap: 14 (ref 5–15)
BUN: 12 mg/dL (ref 6–20)
CO2: 23 mmol/L (ref 22–32)
Calcium: 9 mg/dL (ref 8.9–10.3)
Chloride: 98 mmol/L — ABNORMAL LOW (ref 101–111)
Creatinine, Ser: 0.96 mg/dL (ref 0.44–1.00)
GFR calc Af Amer: >60 mL/min (ref >60)
GFR calc non Af Amer: >60 mL/min (ref >60)
Glucose, Bld: 209 mg/dL — ABNORMAL HIGH (ref 65–99)
Potassium: 3.5 mmol/L (ref 3.5–5.1)
Sodium: 135 mmol/L (ref 135–145)

## 2015-11-30 MED ORDER — IPRATROPIUM BROMIDE 0.02 % IN SOLN
0.5000 mg | RESPIRATORY_TRACT | Status: AC
  Administered 2015-11-30: 0.5 mg via RESPIRATORY_TRACT
  Filled 2015-11-30: qty 2.5

## 2015-11-30 MED ORDER — ALBUTEROL SULFATE (2.5 MG/3ML) 0.083% IN NEBU
INHALATION_SOLUTION | RESPIRATORY_TRACT | Status: AC
  Filled 2015-11-30: qty 6

## 2015-11-30 MED ORDER — DEXAMETHASONE SODIUM PHOSPHATE 10 MG/ML IJ SOLN
10.0000 mg | Freq: Once | INTRAMUSCULAR | Status: AC
  Administered 2015-11-30: 10 mg via INTRAVENOUS
  Filled 2015-11-30: qty 1

## 2015-11-30 MED ORDER — ALBUTEROL SULFATE (2.5 MG/3ML) 0.083% IN NEBU
5.0000 mg | INHALATION_SOLUTION | Freq: Once | RESPIRATORY_TRACT | Status: AC
  Administered 2015-11-30: 5 mg via RESPIRATORY_TRACT

## 2015-11-30 MED ORDER — RACEPINEPHRINE HCL 2.25 % IN NEBU
0.5000 mL | INHALATION_SOLUTION | Freq: Once | RESPIRATORY_TRACT | Status: AC
  Administered 2015-11-30: 0.5 mL via RESPIRATORY_TRACT
  Filled 2015-11-30: qty 0.5

## 2015-11-30 MED ORDER — IOHEXOL 300 MG/ML  SOLN
100.0000 mL | Freq: Once | INTRAMUSCULAR | Status: DC | PRN
  Administered 2015-11-30: 75 mL via INTRAVENOUS
  Filled 2015-11-30: qty 100

## 2015-11-30 MED ORDER — ALBUTEROL (5 MG/ML) CONTINUOUS INHALATION SOLN
10.0000 mg/h | INHALATION_SOLUTION | RESPIRATORY_TRACT | Status: AC
  Administered 2015-11-30: 10 mg/h via RESPIRATORY_TRACT
  Filled 2015-11-30 (×2): qty 20

## 2015-11-30 NOTE — ED Provider Notes (Signed)
CSN: [id number]     Arrival date & time 11/30/15  1506 History   First MD Initiated Contact with Patient 11/30/15 1557     Chief Complaint  Patient presents with  . Shortness of Breath     (Consider location/radiation/quality/duration/timing/severity/associated sxs/prior Treatment) Patient is a 55 y.o. female presenting with shortness of breath.  Shortness of Breath  Patient presents to the Emergency Department complaining of nonproductive cough for 3 days. Patient has a PMH significant for COPD, CHF, Sarcoidosis, DM II, HLD, and GERD. She has had increased SOB over the past three days and the cough has continued to worsen. Patient came to the hospital in January for similar symptoms. She tried tussinex and nebulizer treatments at home with little to no relief. Patient states that last night she was febrile to 100.2. She occasionally gets chills. She took Tylenol last night for the fever. She is complaining of wheezing and having trouble catching her breath. She had a little bit of nasal congestion end endorses chest pain that feels like a soreness from coughing. She denies headaches, dizziness, lightheadedness, nausea, vomiting, diarrhea, constipation, headache, abdominal pain, constipation, palpitations, dysuria, urinary frequency, syncope, weakness or decreased appetite.  Past Medical History  Diagnosis Date  . DIABETES MELLITUS, TYPE II 08/21/2006  . HYPERLIPIDEMIA 08/21/2006  . GOUT 08/20/2010  . OBESITY 06/04/2009  . ANEMIA-UNSPECIFIED 09/18/2009  . HYPERTENSION 08/21/2006  . GERD 08/21/2006  . Internal hemorrhoids   . Pulmonary sarcoidosis ([place])     Followed locally by pulmonology, but also by Dr. Casper Harrison at [facility]  . Exertional chest pain     sharp, substernal, exertional  . Vocal cord dysfunction   . Chronic diastolic heart failure, NYHA class 2 (HCC)     LVEDP roughly 20% by cath  . CHF (congestive heart failure) ([place])   . COPD (chronic obstructive pulmonary  disease) ([place])   . Depression   . Fracture of 5th metatarsal     non union  . Abnormal SPEP 04/17/2014  . Osteomyelitis of left foot ([place]) 05/29/2015  . Diabetic osteomyelitis ([place]) 05/29/2015  . Hx of umbilical hernia repair   . Infection of wound due to methicillin resistant Staphylococcus aureus (MRSA)   . Wears partial dentures   . Onychomycosis 10/27/2015   Past Surgical History  Procedure Laterality Date  . Ventral hernia repair    . Nissen fundoplication  123XX123  . Cholecystectomy  1984  . Abdominal hysterectomy    . Knee arthroscopy      right  . Tubal ligation      with reversal in 1994  . Bladder suspension  11/11/2011    Procedure: TRANSVAGINAL TAPE (TVT) PROCEDURE;  Surgeon: Olga Millers, MD;  Location: [place] [facility];  Service: Gynecology;  Laterality: N/A;  . Cystoscopy  11/11/2011    Procedure: CYSTOSCOPY;  Surgeon: Olga Millers, MD;  Location: [place] [facility];  Service: Gynecology;  Laterality: N/A;  . Doppler echocardiography  02/12/2013    LV FUNCTION, SIZE NORMAL; MILD CONCENTRIC LVH; EST EF 55-65%; WALL MOTION NORMAL  . Lexiscan myoview  03/09/2013    EF 50%; NORMAL MYOCARDIAL PERFUSION STUDY - breast attenuation  . Cardiac catheterization  07/2010    LVEF 50-55% WITH VERY MILD GLOBAL HYPOKINESIA; ESSENTIALLY NORMAL CORONARY ARTERIES; NORMAL LV FUNCTION  . Carotids  02/18/11    CAROTID DUPLEX; VERTEBRALS ARE PATENT WITH ANTEGRADE FLOW. ICA/CCA RATIO 1.61 ON RIGHT AND 0.75 ON LEFT  . Right and left cardiac catheterization  04/23/2013  Angiographic normal coronaries; LVEDP 20 mmHg, PCWP 12-14 mmHg, RAP 12 mmHg.; Fick CO/CI 4.9/2.2  . Metatarsal osteotomy with open reduction internal fixation (orif) metatarsal with fusion Left 04/09/2014    Procedure: LEFT FOOT FRACTURE OPEN TREATMENT METATARSAL INCLUDES INTERNAL FIXATION EACH;  Surgeon: Lorn Junes, MD;  Location: [place];  Service: Orthopedics;  Laterality: Left;  . Left and right heart catheterization with  coronary angiogram N/A 04/23/2013    Procedure: LEFT AND RIGHT HEART CATHETERIZATION WITH CORONARY ANGIOGRAM;  Surgeon: Leonie Man, MD;  Location: [facility] CATH LAB;  Service: Cardiovascular;  Laterality: N/A;  . Hernia repair    . Appendectomy    . Fracture surgery      foot  . I&d extremity Left 06/27/2015    Procedure: Partial Excision Left Calcaneus, Place Antibiotic Beads, and Wound VAC;  Surgeon: Newt Minion, MD;  Location: [place];  Service: Orthopedics;  Laterality: Left;   Family History  Problem Relation Age of Onset  . Diabetes Father   . Heart attack Father   . Coronary artery disease Father   . Heart failure Father   . COPD Mother   . Emphysema Mother   . Asthma Mother   . Heart failure Mother   . Sarcoidosis Maternal Uncle   . Colon cancer Neg Hx   . Lung cancer Brother   . Diabetes Brother   . Heart attack Maternal Grandfather    Social History  Substance Use Topics  . Smoking status: Never Smoker   . Smokeless tobacco: Never Used  . Alcohol Use: No   OB History    No data available     Review of Systems  Respiratory: Positive for shortness of breath.    All other systems negative except as documented in the HPI. All pertinent positives and negatives as reviewed in the HPI.  Allergies  Vancomycin; Chlorhexidine; Lisinopril; Methotrexate; Clindamycin/lincomycin; Doxycycline; and Teflaro  Home Medications   Prior to Admission medications   Medication Sig Start Date End Date Taking? Authorizing Provider  acetaminophen (TYLENOL) 325 MG tablet Take 650 mg by mouth every 6 (six) hours as needed for headache. Reported on 11/12/2015    Historical Provider, MD  albuterol (PROVENTIL HFA;VENTOLIN HFA) 108 (90 BASE) MCG/ACT inhaler Inhale 1-2 puffs into the lungs every 6 (six) hours as needed for wheezing or shortness of breath. 09/25/15   Praveen Mannam, MD  allopurinol (ZYLOPRIM) 100 MG tablet Take 100 mg by mouth at bedtime.     Historical Provider, MD  aspirin EC  81 MG tablet Take 81 mg by mouth daily.     Historical Provider, MD  atorvastatin (LIPITOR) 40 MG tablet TAKE ONE TABLET BY MOUTH ONE TIME DAILY 05/06/15   [name], MD  B-D ULTRAFINE III SHORT PEN 31G X 8 MM MISC FOLLOW DIRECTIONS PROVIDED BY PHYSICIAN 05/06/15   [name], MD  buPROPion (WELLBUTRIN XL) 300 MG 24 hr tablet Take 1 tablet (300 mg total) by mouth daily. 10/03/15   [name], MD  carvedilol (COREG) 25 MG tablet Take 1 tablet (25 mg total) by mouth 2 (two) times daily with a meal. 11/20/15   Leonie Man, MD  cholecalciferol 5000 UNITS TABS Take 1 tablet (5,000 Units total) by mouth daily. Patient taking differently: Take 5,000 Units by mouth every morning.  04/09/14   Kirstin Shepperson, PA-C  DEXILANT 60 MG capsule TAKE ONE CAPSULE BY MOUTH ONE TIME DAILY 03/14/15   Elsie Stain,  MD  diltiazem (CARTIA XT) 120 MG 24 hr capsule Take 1 capsule (120 mg total) by mouth daily. 10/21/15   Leonie Man, MD  estradiol (ESTRACE) 2 MG tablet Take 2 mg by mouth at bedtime.  01/24/14   Historical Provider, MD  fenofibrate (TRICOR) 145 MG tablet TAKE ONE TABLET BY MOUTH ONE TIME DAILY  Patient taking differently: TAKE ONE TABLET BY MOUTH every evening 11/05/14   Leonie Man, MD  fluconazole (DIFLUCAN) 100 MG tablet Take 2 tablets the first day and 1 tablet daily until finished 11/24/15   Irene Shipper, MD  fluticasone [facility]) 50 MCG/ACT nasal spray USE TWO SPRAYS IN [facility] NOSTRIL DAILY 02/03/15   [name], MD  furosemide (LASIX) 40 MG tablet Take 1 tablet (40 mg total) by mouth 2 (two) times daily. PATIENT NEEDS TO CONTACT OFFICE FOR ADDITIONAL REFILLS 11/12/15   [name], MD  gabapentin (NEURONTIN) 100 MG capsule Take 1 capsule (100 mg total) by mouth 3 (three) times daily. **PT NEEDS AN APPT FOR FURTHER REFILLS** 07/21/15   Philemon Kingdom, MD  glucose blood (ONE TOUCH ULTRA TEST) test strip 1 each by Other route 3 (three) times daily. Use as instructed  10/19/13   Lisabeth Pick, MD  hyoscyamine (LEVSIN/SL) 0.125 MG SL tablet 1-2 tablets sub lingually every 4 hours as needed for abdominal pain and cramping 11/24/15   Irene Shipper, MD  insulin aspart (NOVOLOG FLEXPEN) 100 UNIT/ML FlexPen Inject 7-12 Units into the skin 3 (three) times daily with meals. Per sliding scale    Historical Provider, MD  Insulin Pen Needle 31G X 8 MM MISC Use to inject insulin 4 times daily. 04/30/15   Philemon Kingdom, MD  losartan (COZAAR) 100 MG tablet Take 1 tablet (100 mg total) by mouth daily. 11/20/15   Leonie Man, MD  metFORMIN (GLUCOPHAGE) 1000 MG tablet TAKE ONE TABLET TWICE DAILY WITH MEALS 11/27/15   Philemon Kingdom, MD  mometasone-formoterol [facility]) 200-5 MCG/ACT AERO Inhale 2 puffs into the lungs 2 (two) times daily. 09/25/15   Praveen Mannam, MD  nitroGLYCERIN (NITROSTAT) 0.4 MG SL tablet Place 0.4 mg under the tongue every 5 (five) minutes as needed. Reported on 11/12/2015    Historical Provider, MD  ondansetron (ZOFRAN-ODT) 4 MG disintegrating tablet Take 4 mg by mouth every 8 (eight) hours as needed for nausea or vomiting. Reported on 11/12/2015 03/31/15   Historical Provider, MD  PARoxetine Mesylate (BRISDELLE) 7.5 MG CAPS Take 7.5 mg by mouth daily.    Historical Provider, MD  potassium chloride SA (K-DUR,KLOR-CON) 20 MEQ tablet Take 1 tablet (20 mEq total) by mouth 3 (three) times daily. 05/01/15   Thurnell Lose, MD  predniSONE (DELTASONE) 5 MG tablet Take 8 tabs orally by mouth for 2 days, then take 6 tabs orally by mouth for 2 days, then take 4 tabs orally by mouth for 2 days, then take 2 tabs orally by mouth for 2 days, then continue your regular dose of 5 mg. 10/18/15   Verlee Monte, MD  promethazine (PHENERGAN) 25 MG tablet Take 1 tablet (25 mg total) by mouth every 6 (six) hours as needed for nausea or vomiting. 10/04/15   Stevi Barrett, PA-C  terbinafine (LAMISIL) 250 MG tablet Take 1 tablet (250 mg total) by mouth daily. 10/27/15   Truman Hayward,  MD  TOUJEO SOLOSTAR 300 UNIT/ML SOPN INJECT 40 UNITS INTO THE SKIN AT BEDTIME 10/29/15   Philemon Kingdom, MD  venlafaxine XR (  EFFEXOR-XR) 75 MG 24 hr capsule Take 1 capsule (75 mg total) by mouth 2 (two) times daily. 10/03/15   [name], MD  vitamin B-12 (CYANOCOBALAMIN) 1000 MCG tablet Take 1,000 mcg by mouth daily.    Historical Provider, MD   BP 138/71 mmHg  Pulse 95  Temp(Src) 98 F (36.7 C) (Oral)  Resp 32  SpO2 98% Physical Exam  Constitutional: She is oriented to person, place, and time. She appears well-developed and well-nourished. She appears distressed.  HENT:  Head: Normocephalic and atraumatic.  Right Ear: External ear normal.  Left Ear: External ear normal.  Oropharynx is dry with white thick coating present on tongue.   Eyes: Conjunctivae and EOM are normal. Pupils are equal, round, and reactive to light. Right eye exhibits no discharge. Left eye exhibits no discharge.  Neck: Normal range of motion. Neck supple.  Cardiovascular: Regular rhythm, normal heart sounds and intact distal pulses.   No murmur heard. Tachycardic  Pulmonary/Chest: Effort normal. Stridor present. Wheezes: Patient has upper airway wheezes. She has no rales. She exhibits no tenderness.  Abdominal: Soft. Bowel sounds are normal. She exhibits no distension and no mass. There is no tenderness. There is no rebound and no guarding.  Musculoskeletal: Normal range of motion.  Lymphadenopathy:    She has no cervical adenopathy.  Neurological: She is alert and oriented to person, place, and time.  Skin: Skin is warm and dry. No rash noted. Diaphoretic: mild. No erythema.  Psychiatric: She has a normal mood and affect. Her behavior is normal. Judgment and thought content normal.    ED Course  Procedures (including critical care time) Labs Review Labs Reviewed  CBC - Abnormal; Notable for the following:    RBC 3.49 (*)    Hemoglobin 9.8 (*)    HCT 30.6 (*)    All other components within normal  limits  BASIC METABOLIC PANEL    Imaging Review No results found. I have personally reviewed and evaluated these images and lab results as part of my medical decision-making.   EKG Interpretation None     1730 Patient reports no improvement of symptoms, patient has not received her Decadron yet.  1916 Patient is restless in bed, reports that she is not feeling any better after Decadron and nebulizer treatment.  2230 Patient still audibly wheezing in some distress. She states that her breathing improved after administration of racepinephrine. XR of throat demonstrated that valleculae and epiglottis are obscured. Will get CT.  MDM   Final diagnoses:  None    She will be admitted to the hospital for further evaluation and continued monitoring of her laryngeal spasm.  Patient, CT scan did not show any significant abnormality.  I spoke with the [facility] who will evaluate the patient  CRITICAL CARE Performed by: [facility] Total critical care time: 30 minutes Critical care time was exclusive of separately billable procedures and treating other patients. Critical care was necessary to treat or prevent imminent or life-threatening deterioration. Critical care was time spent personally by me on the following activities: development of treatment plan with patient and/or surrogate as well as nursing, discussions with consultants, evaluation of patient's response to treatment, examination of patient, obtaining history from patient or surrogate, ordering and performing treatments and interventions, ordering and review of laboratory studies, ordering and review of radiographic studies, pulse oximetry and re-evaluation of patient's condition. Patient was closely monitored on an hour-long treatment along with the fact that we did give racemic epinephrine  Dalia Heading, PA-C 12/01/15 0128  Jola Schmidt, MD 12/01/15 [phone]

## 2015-11-30 NOTE — ED Notes (Signed)
Pt in radiology. Radiology transport to bring pt to room when finished.

## 2015-11-30 NOTE — ED Notes (Signed)
Pt reports nonproductive cough for 3 days, today with SOB. Pt has wheezing at triage. Pt reports taking tussinex and nebs at home with no relief.

## 2015-12-01 DIAGNOSIS — R0602 Shortness of breath: Secondary | ICD-10-CM | POA: Diagnosis not present

## 2015-12-01 DIAGNOSIS — I5032 Chronic diastolic (congestive) heart failure: Secondary | ICD-10-CM | POA: Diagnosis not present

## 2015-12-01 DIAGNOSIS — E785 Hyperlipidemia, unspecified: Secondary | ICD-10-CM | POA: Diagnosis present

## 2015-12-01 DIAGNOSIS — J385 Laryngeal spasm: Secondary | ICD-10-CM | POA: Diagnosis not present

## 2015-12-01 DIAGNOSIS — Z7952 Long term (current) use of systemic steroids: Secondary | ICD-10-CM | POA: Diagnosis not present

## 2015-12-01 DIAGNOSIS — J441 Chronic obstructive pulmonary disease with (acute) exacerbation: Secondary | ICD-10-CM | POA: Diagnosis not present

## 2015-12-01 DIAGNOSIS — E1165 Type 2 diabetes mellitus with hyperglycemia: Secondary | ICD-10-CM | POA: Diagnosis not present

## 2015-12-01 DIAGNOSIS — K219 Gastro-esophageal reflux disease without esophagitis: Secondary | ICD-10-CM | POA: Diagnosis not present

## 2015-12-01 DIAGNOSIS — J440 Chronic obstructive pulmonary disease with acute lower respiratory infection: Secondary | ICD-10-CM | POA: Diagnosis present

## 2015-12-01 DIAGNOSIS — R05 Cough: Secondary | ICD-10-CM | POA: Diagnosis not present

## 2015-12-01 DIAGNOSIS — J383 Other diseases of vocal cords: Secondary | ICD-10-CM | POA: Diagnosis not present

## 2015-12-01 DIAGNOSIS — E119 Type 2 diabetes mellitus without complications: Secondary | ICD-10-CM | POA: Diagnosis not present

## 2015-12-01 DIAGNOSIS — Z79899 Other long term (current) drug therapy: Secondary | ICD-10-CM | POA: Diagnosis not present

## 2015-12-01 DIAGNOSIS — J209 Acute bronchitis, unspecified: Secondary | ICD-10-CM | POA: Diagnosis present

## 2015-12-01 DIAGNOSIS — E1169 Type 2 diabetes mellitus with other specified complication: Secondary | ICD-10-CM | POA: Diagnosis present

## 2015-12-01 DIAGNOSIS — I10 Essential (primary) hypertension: Secondary | ICD-10-CM | POA: Diagnosis not present

## 2015-12-01 DIAGNOSIS — D869 Sarcoidosis, unspecified: Secondary | ICD-10-CM | POA: Diagnosis not present

## 2015-12-01 LAB — GLUCOSE, CAPILLARY
Glucose-Capillary: 383 mg/dL — ABNORMAL HIGH (ref 65–99)
Glucose-Capillary: 270 mg/dL — ABNORMAL HIGH (ref 65–99)
Glucose-Capillary: 190 mg/dL — ABNORMAL HIGH (ref 65–99)
Glucose-Capillary: 248 mg/dL — ABNORMAL HIGH (ref 65–99)
Glucose-Capillary: 225 mg/dL — ABNORMAL HIGH (ref 65–99)

## 2015-12-01 LAB — INFLUENZA PANEL BY PCR (TYPE A & B)
H1N1 flu by pcr: NOT DETECTED
Influenza A By PCR: NEGATIVE
Influenza B By PCR: NEGATIVE

## 2015-12-01 MED ORDER — TERBINAFINE HCL 250 MG PO TABS
250.0000 mg | ORAL_TABLET | Freq: Every day | ORAL | Status: DC
  Administered 2015-12-01 – 2015-12-05 (×5): 250 mg via ORAL
  Filled 2015-12-01 (×5): qty 1

## 2015-12-01 MED ORDER — INSULIN ASPART 100 UNIT/ML ~~LOC~~ SOLN
0.0000 [IU] | Freq: Three times a day (TID) | SUBCUTANEOUS | Status: DC
  Administered 2015-12-02 (×2): 3 [IU] via SUBCUTANEOUS
  Administered 2015-12-02: 15 [IU] via SUBCUTANEOUS

## 2015-12-01 MED ORDER — PAROXETINE HCL 20 MG PO TABS
10.0000 mg | ORAL_TABLET | Freq: Every day | ORAL | Status: DC
  Administered 2015-12-01 – 2015-12-05 (×5): 10 mg via ORAL
  Filled 2015-12-01 (×5): qty 1

## 2015-12-01 MED ORDER — FLUTICASONE PROPIONATE 50 MCG/ACT NA SUSP
2.0000 | Freq: Every day | NASAL | Status: DC
  Administered 2015-12-02 – 2015-12-05 (×4): 2 via NASAL
  Filled 2015-12-01: qty 16

## 2015-12-01 MED ORDER — MOMETASONE FURO-FORMOTEROL FUM 200-5 MCG/ACT IN AERO
2.0000 | INHALATION_SPRAY | Freq: Two times a day (BID) | RESPIRATORY_TRACT | Status: DC
  Administered 2015-12-01 – 2015-12-02 (×2): 2 via RESPIRATORY_TRACT
  Filled 2015-12-01 (×2): qty 8.8

## 2015-12-01 MED ORDER — ASPIRIN EC 81 MG PO TBEC
81.0000 mg | DELAYED_RELEASE_TABLET | Freq: Every day | ORAL | Status: DC
  Administered 2015-12-01 – 2015-12-05 (×5): 81 mg via ORAL
  Filled 2015-12-01 (×5): qty 1

## 2015-12-01 MED ORDER — ATORVASTATIN CALCIUM 40 MG PO TABS
40.0000 mg | ORAL_TABLET | Freq: Every day | ORAL | Status: DC
  Administered 2015-12-01 – 2015-12-05 (×5): 40 mg via ORAL
  Filled 2015-12-01 (×5): qty 1

## 2015-12-01 MED ORDER — INSULIN ASPART 100 UNIT/ML ~~LOC~~ SOLN
3.0000 [IU] | Freq: Three times a day (TID) | SUBCUTANEOUS | Status: DC
  Administered 2015-12-01 – 2015-12-02 (×5): 3 [IU] via SUBCUTANEOUS

## 2015-12-01 MED ORDER — BUPROPION HCL ER (XL) 300 MG PO TB24
300.0000 mg | ORAL_TABLET | Freq: Every day | ORAL | Status: DC
  Administered 2015-12-01 – 2015-12-05 (×5): 300 mg via ORAL
  Filled 2015-12-01 (×5): qty 1

## 2015-12-01 MED ORDER — IPRATROPIUM-ALBUTEROL 0.5-2.5 (3) MG/3ML IN SOLN
3.0000 mL | RESPIRATORY_TRACT | Status: DC
  Administered 2015-12-01 (×3): 3 mL via RESPIRATORY_TRACT
  Filled 2015-12-01 (×2): qty 3

## 2015-12-01 MED ORDER — AZITHROMYCIN 500 MG PO TABS
500.0000 mg | ORAL_TABLET | Freq: Every day | ORAL | Status: AC
  Administered 2015-12-01: 500 mg via ORAL
  Filled 2015-12-01: qty 1

## 2015-12-01 MED ORDER — ENOXAPARIN SODIUM 40 MG/0.4ML ~~LOC~~ SOLN
40.0000 mg | SUBCUTANEOUS | Status: DC
  Administered 2015-12-01 – 2015-12-05 (×5): 40 mg via SUBCUTANEOUS
  Filled 2015-12-01 (×5): qty 0.4

## 2015-12-01 MED ORDER — CARVEDILOL 12.5 MG PO TABS: 25.0000 mg | ORAL_TABLET | Freq: Two times a day (BID) | ORAL | Status: DC

## 2015-12-01 MED ORDER — AZITHROMYCIN 250 MG PO TABS
250.0000 mg | ORAL_TABLET | Freq: Every day | ORAL | Status: AC
  Administered 2015-12-02 – 2015-12-05 (×4): 250 mg via ORAL
  Filled 2015-12-01 (×4): qty 1

## 2015-12-01 MED ORDER — INSULIN GLARGINE 300 UNIT/ML ~~LOC~~ SOPN: 40.0000 [IU] | PEN_INJECTOR | Freq: Every day | SUBCUTANEOUS | Status: DC

## 2015-12-01 MED ORDER — PREDNISONE 20 MG PO TABS
50.0000 mg | ORAL_TABLET | Freq: Every day | ORAL | Status: DC
  Administered 2015-12-02: 50 mg via ORAL
  Filled 2015-12-01 (×2): qty 2

## 2015-12-01 MED ORDER — INSULIN ASPART 100 UNIT/ML ~~LOC~~ SOLN
0.0000 [IU] | Freq: Three times a day (TID) | SUBCUTANEOUS | Status: DC
  Administered 2015-12-01: 2 [IU] via SUBCUTANEOUS
  Administered 2015-12-01: 3 [IU] via SUBCUTANEOUS
  Administered 2015-12-01: 5 [IU] via SUBCUTANEOUS

## 2015-12-01 MED ORDER — PAROXETINE MESYLATE 7.5 MG PO CAPS: 7.5000 mg | ORAL_CAPSULE | Freq: Every day | ORAL | Status: DC

## 2015-12-01 MED ORDER — PHENOL 1.4 % MT LIQD
1.0000 | OROMUCOSAL | Status: DC | PRN
  Administered 2015-12-01: 1 via OROMUCOSAL
  Filled 2015-12-01: qty 177

## 2015-12-01 MED ORDER — BENZONATATE 100 MG PO CAPS: 100.0000 mg | ORAL_CAPSULE | Freq: Three times a day (TID) | ORAL | Status: DC | PRN

## 2015-12-01 MED ORDER — GUAIFENESIN ER 600 MG PO TB12
600.0000 mg | ORAL_TABLET | Freq: Two times a day (BID) | ORAL | Status: DC
  Administered 2015-12-01 – 2015-12-05 (×9): 600 mg via ORAL
  Filled 2015-12-01 (×9): qty 1

## 2015-12-01 MED ORDER — GABAPENTIN 100 MG PO CAPS
100.0000 mg | ORAL_CAPSULE | Freq: Three times a day (TID) | ORAL | Status: DC
  Administered 2015-12-01 – 2015-12-05 (×13): 100 mg via ORAL
  Filled 2015-12-01 (×13): qty 1

## 2015-12-01 MED ORDER — DILTIAZEM HCL ER COATED BEADS 120 MG PO CP24
120.0000 mg | ORAL_CAPSULE | Freq: Every day | ORAL | Status: DC
  Administered 2015-12-01 – 2015-12-05 (×5): 120 mg via ORAL
  Filled 2015-12-01 (×5): qty 1

## 2015-12-01 MED ORDER — INSULIN ASPART 100 UNIT/ML ~~LOC~~ SOLN
0.0000 [IU] | Freq: Every day | SUBCUTANEOUS | Status: DC
Start: 2015-12-01 — End: 2015-12-02
  Administered 2015-12-01: 2 [IU] via SUBCUTANEOUS

## 2015-12-01 MED ORDER — VITAMIN B-12 1000 MCG PO TABS
1000.0000 ug | ORAL_TABLET | Freq: Every day | ORAL | Status: DC
  Administered 2015-12-01 – 2015-12-05 (×5): 1000 ug via ORAL
  Filled 2015-12-01 (×6): qty 1

## 2015-12-01 MED ORDER — LOSARTAN POTASSIUM 50 MG PO TABS
100.0000 mg | ORAL_TABLET | Freq: Every day | ORAL | Status: DC
  Administered 2015-12-01: 100 mg via ORAL
  Filled 2015-12-01: qty 2

## 2015-12-01 MED ORDER — POTASSIUM CHLORIDE CRYS ER 20 MEQ PO TBCR
20.0000 meq | EXTENDED_RELEASE_TABLET | Freq: Three times a day (TID) | ORAL | Status: DC
  Administered 2015-12-01 – 2015-12-05 (×13): 20 meq via ORAL
  Filled 2015-12-01 (×13): qty 1

## 2015-12-01 MED ORDER — ALLOPURINOL 100 MG PO TABS
100.0000 mg | ORAL_TABLET | Freq: Every day | ORAL | Status: DC
  Administered 2015-12-01 – 2015-12-04 (×5): 100 mg via ORAL
  Filled 2015-12-01 (×5): qty 1

## 2015-12-01 MED ORDER — INSULIN GLARGINE 100 UNIT/ML ~~LOC~~ SOLN
30.0000 [IU] | Freq: Every day | SUBCUTANEOUS | Status: DC
  Administered 2015-12-01 – 2015-12-02 (×3): 30 [IU] via SUBCUTANEOUS
  Filled 2015-12-01 (×4): qty 0.3

## 2015-12-01 MED ORDER — NITROGLYCERIN 0.4 MG SL SUBL: 0.4000 mg | SUBLINGUAL_TABLET | SUBLINGUAL | Status: DC | PRN

## 2015-12-01 MED ORDER — VENLAFAXINE HCL ER 150 MG PO CP24
150.0000 mg | ORAL_CAPSULE | Freq: Every day | ORAL | Status: DC
  Administered 2015-12-01 – 2015-12-04 (×5): 150 mg via ORAL
  Filled 2015-12-01 (×5): qty 1

## 2015-12-01 MED ORDER — FUROSEMIDE 40 MG PO TABS
40.0000 mg | ORAL_TABLET | Freq: Two times a day (BID) | ORAL | Status: DC
  Administered 2015-12-01 – 2015-12-05 (×9): 40 mg via ORAL
  Filled 2015-12-01 (×9): qty 1

## 2015-12-01 MED ORDER — HYDROCOD POLST-CPM POLST ER 10-8 MG/5ML PO SUER
5.0000 mL | Freq: Every day | ORAL | Status: DC | PRN
  Administered 2015-12-01 – 2015-12-04 (×4): 5 mL via ORAL
  Filled 2015-12-01 (×4): qty 5

## 2015-12-01 MED ORDER — IPRATROPIUM-ALBUTEROL 0.5-2.5 (3) MG/3ML IN SOLN
3.0000 mL | Freq: Three times a day (TID) | RESPIRATORY_TRACT | Status: DC
  Administered 2015-12-01 – 2015-12-03 (×6): 3 mL via RESPIRATORY_TRACT
  Filled 2015-12-01 (×6): qty 3

## 2015-12-01 NOTE — Progress Notes (Signed)
[facility] PROGRESS NOTE  LYNNSEY LASLIE [id number] DOB: 03-Sep-1961 DOA: 11/30/2015 PCP: Garret Reddish, MD  Brief Summary  Patient is a 55 yo female with history of COPD, sarcoidosis, DM, HTN who came with cc of non productive cough for 3 days associated with dyspnea and mild wheezing. No chest pain. No fever/chills. No preceding URI symptoms but feels her throat is tight. No N/V/D/C/abd pain/dysuria/rash. No pruritus or hives or angioedema. She used albuterol neb once every night for the last 2 nights w/o much relief.   Assessment/Plan  COPD exacerbation Vs. Sarcoidosis flare continue prednisone 50 mg daily ( she is on 5 mg daily at home).  Agree with Albuterol/ipratropium Q4H Oxygen via [place] Azithromycin for acute bronchitis ( CT scan suggest possible pharyngitis, likely viral) Flu Ag negative  Sarcoidosis On chronic steroids at home, see above  Hypertension, BP elevated Continue home meds except coreg. May need to change BB to another agent such as metoprolol or bisoprolol or different class altogether  DM, hyperglycemic -  Cont lantus -  Add 3 units aspart with meals - increase to moderate dose sliding scale insulin   Diastolic heart failure, Stable, no evidence of volume overload - Continue Lasix dose at 40 mg twice a day.   Diet:  Diabetic  Access:  PIV  IVF:  Off  Proph:  Lovenox  Code Status: Full code  Family Communication: Patient and her parents  Disposition Plan: Pending able to complete basic ADLs without severe dyspnea, hypoxia, possibly home tomorrow  Consultants: None  Procedures: Chest x-ray  Antibiotics:  Azithromycin 2/20   HPI/Subjective:  States that she had considerable dyspnea when she got up to the bathroom. She has a cough and feels that she needs to cough stuff out of her chest but cannot. She denies fevers, chills, nausea, vomiting, chest pains. Breathing treatments are helping but she still feels  wheezy.  Objective: Filed Vitals:   12/01/15 0850 12/01/15 0946 12/01/15 1100 12/01/15 1528  BP:  161/69 151/66   Pulse:  79 77   Temp:  97.9 F (36.6 C) 97.5 F (36.4 C)   TempSrc:  Oral Oral   Resp:  18 18   Height:      Weight:      SpO2: 100% 99% 100% 100%    Intake/Output Summary (Last 24 hours) at 12/01/15 1830 Last data filed at 12/01/15 0730  Gross per 24 hour  Intake    240 ml  Output      0 ml  Net    240 ml   Filed Weights   12/01/15 0214  Weight: 112.356 kg (247 lb 11.2 oz)   Body mass index is 40 kg/(m^2).  Exam:   General:   obese female, No acute distress, Frequent wheezy cough while talking   HEENT:  NCAT, MMM  Cardiovascular:  RRR, nl S1, S2 no mrg, 2+ pulses, warm extremities  Respiratory: Diminished bilateral breath sounds with high-pitched full expiratory wheeze with prolonged expiratory phase, no focal rales or rhonchi  Abdomen:   NABS, soft, NT/ND  MSK:   Normal tone and bulk, no LEE  Neuro:  Grossly intact  Data Reviewed: Basic Metabolic Panel:  Recent Labs Lab 11/30/15 1535  NA 135  K 3.5  CL 98*  CO2 23  GLUCOSE 209*  BUN 12  CREATININE 0.96  CALCIUM 9.0   Liver Function Tests: No results for input(s): AST, ALT, ALKPHOS, BILITOT, PROT, ALBUMIN in the last 168 hours. No results for input(s):  LIPASE, AMYLASE in the last 168 hours. No results for input(s): AMMONIA in the last 168 hours. CBC:  Recent Labs Lab 11/30/15 1535  WBC 9.8  HGB 9.8*  HCT 30.6*  MCV 87.7  PLT 293    No results found for this or any previous visit (from the past 240 hour(s)).   Studies: Dg Neck Soft Tissue  11/30/2015  CLINICAL DATA:  Upper airway wheezing. EXAM: NECK SOFT TISSUES - 1+ VIEW COMPARISON:  None. FINDINGS: The valleculae and epiglottis are obscured. There is no evidence of retropharyngeal soft tissue swelling. The cervical airway is unremarkable. No radio-opaque foreign body identified. Patient is edentulous. IMPRESSION:  Vallecular and epiglottis are obscured, significance uncertain. Neck CT, preferably with contrast could be considered for further evaluation. Electronically Signed   By: Jeb Levering M.D.   On: 11/30/2015 21:44   Dg Chest 2 View  11/30/2015  CLINICAL DATA:  Nonproductive cough for 4 days. Shortness breath with generalized chest pain. History of COPD/ asthma, hypertension and diabetes. EXAM: CHEST  2 VIEW COMPARISON:  05/07/2015 and 10/13/2015. FINDINGS: The heart size and mediastinal contours are stable. There are persistent low lung volumes with bibasilar atelectasis and vascular crowding. No edema, confluent airspace opacity or significant pleural effusion identified. The bones appear unchanged. IMPRESSION: Stable chest with low lung volumes and bibasilar atelectasis. No acute findings. Electronically Signed   By: Richardean Sale M.D.   On: 11/30/2015 16:31   Ct Soft Tissue Neck W Contrast  12/01/2015  CLINICAL DATA:  Initial evaluation for upper airway irritation. EXAM: CT NECK WITH CONTRAST TECHNIQUE: Multidetector CT imaging of the neck was performed using the standard protocol following the bolus administration of intravenous contrast. CONTRAST:  98mL OMNIPAQUE IOHEXOL 300 MG/ML  SOLN COMPARISON:  None. FINDINGS: Partially visualized brain is unremarkable. Minimal mucosal thickening within the partially visualized maxillary sinuses, right greater than left. Partially visualized paranasal sinuses are otherwise clear. Partially visualized mastoid air cells are clear. Middle ear cavities are clear. Salivary glands including the parotid glands and submandibular glands are within normal limits. Patient is edentulous. Oral cavity within normal limits. Palatine tonsils unremarkable. Parapharyngeal fat preserved. Remainder of the oropharynx and nasopharynx within normal limits. Epiglottis normal. Vallecula clear. Minimal induration within the retropharyngeal fat, which may reflect very mild pharyngitis. No  frank retropharyngeal fluid collection or abscess. Remainder of the hypopharynx and supraglottic larynx within normal limits. True cords unremarkable. Subglottic airway is clear. Thyroid gland within normal limits. No adenopathy within the neck. Partially visualized superior mediastinum demonstrates no acute abnormality. Partially visualized lungs are clear. Normal intravascular enhancement seen throughout the neck. Carotid arteries are medialized into the retropharyngeal space bilaterally. Mild atheromatous plaque about the carotid bifurcations bilaterally, slightly greater on the left. No acute osseous abnormality. No worrisome lytic or blastic osseous lesions. IMPRESSION: 1. Minimal induration/ inflammatory stranding within the retropharyngeal fat, which may reflect very mild or early/developing acute pharyngitis. No abscess or drainable fluid collection. Airway is widely patent. 2. Otherwise negative CT of the neck. Electronically Signed   By: Jeannine Boga M.D.   On: 12/01/2015 00:18    Scheduled Meds: . allopurinol  100 mg Oral QHS  . aspirin EC  81 mg Oral Daily  . atorvastatin  40 mg Oral Daily  . [START ON 12/02/2015] azithromycin  250 mg Oral Daily  . buPROPion  300 mg Oral Daily  . diltiazem  120 mg Oral Daily  . enoxaparin (LOVENOX) injection  40 mg Subcutaneous Q24H  .  fluticasone  2 spray Each Nare Daily  . furosemide  40 mg Oral BID  . gabapentin  100 mg Oral TID  . guaiFENesin  600 mg Oral BID  . insulin aspart  0-9 Units Subcutaneous TID WC  . insulin aspart  3 Units Subcutaneous TID WC  . insulin glargine  30 Units Subcutaneous QHS  . ipratropium-albuterol  3 mL Nebulization TID  . losartan  100 mg Oral Daily  . mometasone-formoterol  2 puff Inhalation BID  . PARoxetine  10 mg Oral Daily  . potassium chloride SA  20 mEq Oral TID  . predniSONE  50 mg Oral Q breakfast  . terbinafine  250 mg Oral Daily  . venlafaxine XR  150 mg Oral QHS  . vitamin B-12  1,000 mcg Oral  Daily   Continuous Infusions:   Active Problems:   COPD exacerbation (HCC)   SOB (shortness of breath)    Time spent: 30 min    Amilliana Hayworth, [facility] Pager [phone]. If 7PM-7AM, please contact night-coverage at [email or web address], password [facility] 12/01/2015, 6:30 PM  LOS: 0 days

## 2015-12-01 NOTE — H&P (Signed)
History and Physical  [name] [id number] DOB: 1961-02-03 DOA: 11/30/2015  PCP: Garret Reddish, MD   Chief Complaint: cough  History of Present Illness:  Patient is a 55 yo female with history of COPD, sarcoidosis, DM, HTN who came with cc of non productive cough for 3 days associated with dyspnea and mild wheezing. No chest pain. No fever/chills. No preceding URI symptoms but feels her throat is tight. No N/V/D/C/abd pain/dysuria/rash. No pruritus or hives or angioedema. She used albuterol neb once every night for the last 2 nights w/o much relief.   Review of Systems:  CONSTITUTIONAL:  No night sweats.  +fatigue, malaise, lethargy.  No fever or chills. Eyes:  No visual changes.  No eye pain.  No eye discharge.   ENT:    No epistaxis.  No sinus pain.  +sore throat.  No ear pain.  No congestion. RESPIRATORY:  +cough.  +wheeze.  No hemoptysis.  +shortness of breath. CARDIOVASCULAR:  No chest pains.  No palpitations. GASTROINTESTINAL:  No abdominal pain.  No nausea or vomiting.  No diarrhea or constipation.  No hematemesis.  No hematochezia.  No melena. GENITOURINARY:  No urgency.  No frequency.  No dysuria.  No hematuria.  No obstructive symptoms.  No discharge.  No pain.  No significant abnormal bleeding. MUSCULOSKELETAL:  No musculoskeletal pain.  No joint swelling.  No arthritis. NEUROLOGICAL:  No confusion.  No weakness. No headache. No seizure. PSYCHIATRIC:  No depression. No anxiety. No suicidal ideation. SKIN:  No rashes.  No lesions.  No wounds. ENDOCRINE:  No unexplained weight loss.  No polydipsia.  No polyuria.  No polyphagia. HEMATOLOGIC:  No anemia.  No purpura.  No petechiae.  No bleeding.  ALLERGIC AND IMMUNOLOGIC:  No pruritus.  No swelling Other:  Past Medical and Surgical History:   Past Medical History  Diagnosis Date  . DIABETES MELLITUS, TYPE II 08/21/2006  . HYPERLIPIDEMIA 08/21/2006  . GOUT 08/20/2010  . OBESITY 06/04/2009  .  ANEMIA-UNSPECIFIED 09/18/2009  . HYPERTENSION 08/21/2006  . GERD 08/21/2006  . Internal hemorrhoids   . Pulmonary sarcoidosis ([place])     Followed locally by pulmonology, but also by Dr. Casper Harrison at [facility]  . Exertional chest pain     sharp, substernal, exertional  . Vocal cord dysfunction   . Chronic diastolic heart failure, NYHA class 2 (HCC)     LVEDP roughly 20% by cath  . CHF (congestive heart failure) ([place])   . COPD (chronic obstructive pulmonary disease) ([place])   . Depression   . Fracture of 5th metatarsal     non union  . Abnormal SPEP 04/17/2014  . Osteomyelitis of left foot ([place]) 05/29/2015  . Diabetic osteomyelitis ([place]) 05/29/2015  . Hx of umbilical hernia repair   . Infection of wound due to methicillin resistant Staphylococcus aureus (MRSA)   . Wears partial dentures   . Onychomycosis 10/27/2015   Past Surgical History  Procedure Laterality Date  . Ventral hernia repair    . Nissen fundoplication  [id number]  . Cholecystectomy  1984  . Abdominal hysterectomy    . Knee arthroscopy      right  . Tubal ligation      with reversal in 1994  . Bladder suspension  11/11/2011    Procedure: TRANSVAGINAL TAPE (TVT) PROCEDURE;  Surgeon: Olga Millers, MD;  Location: [facility];  Service: Gynecology;  Laterality: N/A;  . Cystoscopy  11/11/2011    Procedure: CYSTOSCOPY;  Surgeon: Olga Millers, MD;  Location: [facility];  Service: Gynecology;  Laterality: N/A;  . Doppler echocardiography  02/12/2013    LV FUNCTION, SIZE NORMAL; MILD CONCENTRIC LVH; EST EF 55-65%; WALL MOTION NORMAL  . Lexiscan myoview  03/09/2013    EF 50%; NORMAL MYOCARDIAL PERFUSION STUDY - breast attenuation  . Cardiac catheterization  07/2010    LVEF 50-55% WITH VERY MILD GLOBAL HYPOKINESIA; ESSENTIALLY NORMAL CORONARY ARTERIES; NORMAL LV FUNCTION  . Carotids  02/18/11    CAROTID DUPLEX; VERTEBRALS ARE PATENT WITH ANTEGRADE FLOW. ICA/CCA RATIO 1.61 ON RIGHT AND 0.75 ON LEFT  . Right and left cardiac  catheterization  04/23/2013    Angiographic normal coronaries; LVEDP 20 mmHg, PCWP 12-14 mmHg, RAP 12 mmHg.; Fick CO/CI 4.9/2.2  . Metatarsal osteotomy with open reduction internal fixation (orif) metatarsal with fusion Left 04/09/2014    Procedure: LEFT FOOT FRACTURE OPEN TREATMENT METATARSAL INCLUDES INTERNAL FIXATION EACH;  Surgeon: Lorn Junes, MD;  Location: [facility];  Service: Orthopedics;  Laterality: Left;  . Left and right heart catheterization with coronary angiogram N/A 04/23/2013    Procedure: LEFT AND RIGHT HEART CATHETERIZATION WITH CORONARY ANGIOGRAM;  Surgeon: Leonie Man, MD;  Location: [facility];  Service: Cardiovascular;  Laterality: N/A;  . Hernia repair    . Appendectomy    . Fracture surgery      foot  . I&d extremity Left 06/27/2015    Procedure: Partial Excision Left Calcaneus, Place Antibiotic Beads, and Wound VAC;  Surgeon: Newt Minion, MD;  Location: [facility];  Service: Orthopedics;  Laterality: Left;    Social History:   reports that she has never smoked. She has never used smokeless tobacco. She reports that she does not drink alcohol or use illicit drugs.   Allergies  Allergen Reactions  . Methotrexate Other (See Comments)     peri-oral and buccal lesions.  . Vancomycin Other (See Comments)    nephrotoxicity  . Clindamycin/Lincomycin Nausea And Vomiting and Rash  . Chlorhexidine Itching  . Doxycycline Rash  . Lisinopril Cough  . Teflaro [Ceftaroline] Rash    Family History  Problem Relation Age of Onset  . Diabetes Father   . Heart attack Father   . Coronary artery disease Father   . Heart failure Father   . COPD Mother   . Emphysema Mother   . Asthma Mother   . Heart failure Mother   . Sarcoidosis Maternal Uncle   . Colon cancer Neg Hx   . Lung cancer Brother   . Diabetes Brother   . Heart attack Maternal Grandfather       Prior to Admission medications   Medication Sig Start Date End Date Taking? Authorizing  Provider  acetaminophen (TYLENOL) 325 MG tablet Take 650 mg by mouth every 6 (six) hours as needed for headache. Reported on 11/12/2015   Yes Historical Provider, MD  albuterol (PROVENTIL HFA;VENTOLIN HFA) 108 (90 BASE) MCG/ACT inhaler Inhale 1-2 puffs into the lungs every 6 (six) hours as needed for wheezing or shortness of breath. 09/25/15  Yes Praveen Mannam, MD  allopurinol (ZYLOPRIM) 100 MG tablet Take 100 mg by mouth at bedtime.    Yes Historical Provider, MD  aspirin EC 81 MG tablet Take 81 mg by mouth daily.    Yes Historical Provider, MD  atorvastatin (LIPITOR) 40 MG tablet TAKE ONE TABLET BY MOUTH ONE TIME DAILY 05/06/15  Yes [name], MD  benzonatate (TESSALON) 100 MG capsule Take 100 mg by mouth 3 (three) times  daily as needed for cough.   Yes Historical Provider, MD  buPROPion (WELLBUTRIN XL) 300 MG 24 hr tablet Take 1 tablet (300 mg total) by mouth daily. 10/03/15  Yes [name], MD  carvedilol (COREG) 25 MG tablet Take 1 tablet (25 mg total) by mouth 2 (two) times daily with a meal. 11/20/15  Yes Leonie Man, MD  chlorpheniramine-HYDROcodone (TUSSIONEX) 10-8 MG/5ML SUER Take 5 mLs by mouth daily as needed for cough.  11/03/15  Yes Historical Provider, MD  cholecalciferol 5000 UNITS TABS Take 1 tablet (5,000 Units total) by mouth daily. Patient taking differently: Take 5,000 Units by mouth every morning.  04/09/14  Yes Kirstin Shepperson, PA-C  DEXILANT 60 MG capsule TAKE ONE CAPSULE BY MOUTH ONE TIME DAILY 03/14/15  Yes Elsie Stain, MD  diltiazem (CARTIA XT) 120 MG 24 hr capsule Take 1 capsule (120 mg total) by mouth daily. 10/21/15  Yes Leonie Man, MD  estradiol (ESTRACE) 2 MG tablet Take 2 mg by mouth at bedtime.  01/24/14  Yes Historical Provider, MD  fenofibrate (TRICOR) 145 MG tablet TAKE ONE TABLET BY MOUTH ONE TIME DAILY  Patient taking differently: TAKE ONE TABLET BY MOUTH every evening 11/05/14  Yes Leonie Man, MD  fluticasone [facility]) 50 MCG/ACT nasal  spray USE TWO SPRAYS IN [facility] NOSTRIL DAILY 02/03/15  Yes [name], MD  furosemide (LASIX) 40 MG tablet Take 1 tablet (40 mg total) by mouth 2 (two) times daily. PATIENT NEEDS TO CONTACT OFFICE FOR ADDITIONAL REFILLS 11/12/15  Yes [name], MD  gabapentin (NEURONTIN) 100 MG capsule Take 1 capsule (100 mg total) by mouth 3 (three) times daily. **PT NEEDS AN APPT FOR FURTHER REFILLS** 07/21/15  Yes Philemon Kingdom, MD  insulin aspart (NOVOLOG FLEXPEN) 100 UNIT/ML FlexPen Inject 7-12 Units into the skin 3 (three) times daily with meals. Per sliding scale   Yes Historical Provider, MD  losartan (COZAAR) 100 MG tablet Take 1 tablet (100 mg total) by mouth daily. 11/20/15  Yes Leonie Man, MD  metFORMIN (GLUCOPHAGE) 1000 MG tablet TAKE ONE TABLET TWICE DAILY WITH MEALS 11/27/15  Yes Philemon Kingdom, MD  mometasone-formoterol [facility]) 200-5 MCG/ACT AERO Inhale 2 puffs into the lungs 2 (two) times daily. 09/25/15  Yes Praveen Mannam, MD  nitroGLYCERIN (NITROSTAT) 0.4 MG SL tablet Place 0.4 mg under the tongue every 5 (five) minutes as needed. Reported on 11/12/2015   Yes Historical Provider, MD  ondansetron (ZOFRAN-ODT) 4 MG disintegrating tablet Take 4 mg by mouth every 8 (eight) hours as needed for nausea or vomiting. Reported on 11/12/2015 03/31/15  Yes Historical Provider, MD  PARoxetine Mesylate (BRISDELLE) 7.5 MG CAPS Take 7.5 mg by mouth daily.   Yes Historical Provider, MD  Polyethyl Glycol-Propyl Glycol (SYSTANE) 0.4-0.3 % SOLN Apply 1 drop to eye at bedtime.   Yes Historical Provider, MD  potassium chloride SA (K-DUR,KLOR-CON) 20 MEQ tablet Take 1 tablet (20 mEq total) by mouth 3 (three) times daily. 05/01/15  Yes Thurnell Lose, MD  predniSONE (DELTASONE) 5 MG tablet Take 8 tabs orally by mouth for 2 days, then take 6 tabs orally by mouth for 2 days, then take 4 tabs orally by mouth for 2 days, then take 2 tabs orally by mouth for 2 days, then continue your regular dose of 5 mg. Patient  taking differently: Take 5 mg by mouth daily with breakfast.  10/18/15  Yes Verlee Monte, MD  terbinafine (LAMISIL) 250 MG tablet Take 1 tablet (  250 mg total) by mouth daily. 10/27/15  Yes Truman Hayward, MD  TOUJEO SOLOSTAR 300 UNIT/ML SOPN INJECT 40 UNITS INTO THE SKIN AT BEDTIME 10/29/15  Yes Philemon Kingdom, MD  venlafaxine XR (EFFEXOR-XR) 75 MG 24 hr capsule Take 1 capsule (75 mg total) by mouth 2 (two) times daily. Patient taking differently: Take 150 mg by mouth at bedtime.  10/03/15  Yes [name], MD  vitamin B-12 (CYANOCOBALAMIN) 1000 MCG tablet Take 1,000 mcg by mouth daily.   Yes Historical Provider, MD  B-D ULTRAFINE III SHORT PEN 31G X 8 MM MISC FOLLOW DIRECTIONS PROVIDED BY PHYSICIAN 05/06/15   [name], MD  fluconazole (DIFLUCAN) 100 MG tablet Take 2 tablets the first day and 1 tablet daily until finished 11/24/15   Irene Shipper, MD  glucose blood (ONE TOUCH ULTRA TEST) test strip 1 each by Other route 3 (three) times daily. Use as instructed 10/19/13   Lisabeth Pick, MD  hyoscyamine (LEVSIN/SL) 0.125 MG SL tablet 1-2 tablets sub lingually every 4 hours as needed for abdominal pain and cramping 11/24/15   Irene Shipper, MD  Insulin Pen Needle 31G X 8 MM MISC Use to inject insulin 4 times daily. 04/30/15   Philemon Kingdom, MD  promethazine (PHENERGAN) 25 MG tablet Take 1 tablet (25 mg total) by mouth every 6 (six) hours as needed for nausea or vomiting. 10/04/15   Josephina Gip, PA-C    Physical Exam: BP 144/66 mmHg  Pulse 104  Temp(Src) 98 F (36.7 C) (Oral)  Resp 17  SpO2 100%  GENERAL : Well developed, well nourished, alert and cooperative, and appears to be in mild acute distress. Speaks in full sentences.  HEAD: normocephalic. EYES: PERRL, EOMI.  NOSE: No nasal discharge. NECK: Neck supple CARDIAC: Normal S1 and S2. No S3, S4 or murmurs. Rhythm is regular. There is no peripheral edema LUNGS: poor air entry, tachypnea, mild wheezing. ABDOMEN: Positive bowel  sounds. Soft, nondistended, nontender. No guarding or rebound. No masses. NEUROLOGICAL: The mental examination revealed the patient was oriented to person, place, and time.CN II-XII intact.  SKIN: Skin normal color PSYCHIATRIC:  The patient was able to demonstrate good judgement and reason, without hallucinations          Labs on Admission:  Reviewed.   Radiological Exams on Admission: Dg Neck Soft Tissue  11/30/2015  CLINICAL DATA:  Upper airway wheezing. EXAM: NECK SOFT TISSUES - 1+ VIEW COMPARISON:  None. FINDINGS: The valleculae and epiglottis are obscured. There is no evidence of retropharyngeal soft tissue swelling. The cervical airway is unremarkable. No radio-opaque foreign body identified. Patient is edentulous. IMPRESSION: Vallecular and epiglottis are obscured, significance uncertain. Neck CT, preferably with contrast could be considered for further evaluation. Electronically Signed   By: Jeb Levering M.D.   On: 11/30/2015 21:44   Dg Chest 2 View  11/30/2015  CLINICAL DATA:  Nonproductive cough for 4 days. Shortness breath with generalized chest pain. History of COPD/ asthma, hypertension and diabetes. EXAM: CHEST  2 VIEW COMPARISON:  05/07/2015 and 10/13/2015. FINDINGS: The heart size and mediastinal contours are stable. There are persistent low lung volumes with bibasilar atelectasis and vascular crowding. No edema, confluent airspace opacity or significant pleural effusion identified. The bones appear unchanged. IMPRESSION: Stable chest with low lung volumes and bibasilar atelectasis. No acute findings. Electronically Signed   By: Richardean Sale M.D.   On: 11/30/2015 16:31   Ct Soft Tissue Neck W Contrast  12/01/2015  CLINICAL DATA:  Initial evaluation for upper airway irritation. EXAM: CT NECK WITH CONTRAST TECHNIQUE: Multidetector CT imaging of the neck was performed using the standard protocol following the bolus administration of intravenous contrast. CONTRAST:  40mL  OMNIPAQUE IOHEXOL 300 MG/ML  SOLN COMPARISON:  None. FINDINGS: Partially visualized brain is unremarkable. Minimal mucosal thickening within the partially visualized maxillary sinuses, right greater than left. Partially visualized paranasal sinuses are otherwise clear. Partially visualized mastoid air cells are clear. Middle ear cavities are clear. Salivary glands including the parotid glands and submandibular glands are within normal limits. Patient is edentulous. Oral cavity within normal limits. Palatine tonsils unremarkable. Parapharyngeal fat preserved. Remainder of the oropharynx and nasopharynx within normal limits. Epiglottis normal. Vallecula clear. Minimal induration within the retropharyngeal fat, which may reflect very mild pharyngitis. No frank retropharyngeal fluid collection or abscess. Remainder of the hypopharynx and supraglottic larynx within normal limits. True cords unremarkable. Subglottic airway is clear. Thyroid gland within normal limits. No adenopathy within the neck. Partially visualized superior mediastinum demonstrates no acute abnormality. Partially visualized lungs are clear. Normal intravascular enhancement seen throughout the neck. Carotid arteries are medialized into the retropharyngeal space bilaterally. Mild atheromatous plaque about the carotid bifurcations bilaterally, slightly greater on the left. No acute osseous abnormality. No worrisome lytic or blastic osseous lesions. IMPRESSION: 1. Minimal induration/ inflammatory stranding within the retropharyngeal fat, which may reflect very mild or early/developing acute pharyngitis. No abscess or drainable fluid collection. Airway is widely patent. 2. Otherwise negative CT of the neck. Electronically Signed   By: Jeannine Boga M.D.   On: 12/01/2015 00:18    EKG:  Independently reviewed. NSR  Assessment/Plan  COPD exacerbation: Vs. Sarcoidosis flare Started on prednisone 50 mg daily ( she is on 5 mg daily at home).    Albuterol/ipratropium Q4H Oxygen via [place] Azithromycin for acute bronchitis ( CT scan suggest possible pharyngitis, likely viral) Flu Ag ordered.   Sarcoidosis On chronic steroids at home  hypertension Continue home meds except coreg. May need to change BB to another agent on discharge.   DM: Cont lantus, started on low dose correction.    diastolic heart failure  Lasix dose at 40 mg twice a day.   DVT prophylaxis:  enoxaparin GI prophylaxis:PPI Code Status: Full code      Gennaro Africa M.D [facility]

## 2015-12-02 DIAGNOSIS — I10 Essential (primary) hypertension: Secondary | ICD-10-CM

## 2015-12-02 DIAGNOSIS — R0602 Shortness of breath: Secondary | ICD-10-CM | POA: Diagnosis present

## 2015-12-02 DIAGNOSIS — E119 Type 2 diabetes mellitus without complications: Secondary | ICD-10-CM

## 2015-12-02 DIAGNOSIS — I5032 Chronic diastolic (congestive) heart failure: Secondary | ICD-10-CM | POA: Insufficient documentation

## 2015-12-02 LAB — GLUCOSE, CAPILLARY
Glucose-Capillary: 138 mg/dL — ABNORMAL HIGH (ref 65–99)
Glucose-Capillary: 191 mg/dL — ABNORMAL HIGH (ref 65–99)
Glucose-Capillary: 360 mg/dL — ABNORMAL HIGH (ref 65–99)
Glucose-Capillary: 417 mg/dL — ABNORMAL HIGH (ref 65–99)

## 2015-12-02 MED ORDER — BUDESONIDE 0.5 MG/2ML IN SUSP
0.5000 mg | Freq: Two times a day (BID) | RESPIRATORY_TRACT | Status: DC
  Administered 2015-12-02 – 2015-12-05 (×7): 0.5 mg via RESPIRATORY_TRACT
  Filled 2015-12-02 (×7): qty 2

## 2015-12-02 MED ORDER — METHYLPREDNISOLONE SODIUM SUCC 125 MG IJ SOLR
60.0000 mg | Freq: Two times a day (BID) | INTRAMUSCULAR | Status: DC
  Administered 2015-12-02 – 2015-12-04 (×6): 60 mg via INTRAVENOUS
  Filled 2015-12-02 (×6): qty 2

## 2015-12-02 MED ORDER — INSULIN ASPART 100 UNIT/ML ~~LOC~~ SOLN
0.0000 [IU] | Freq: Three times a day (TID) | SUBCUTANEOUS | Status: DC
  Administered 2015-12-03: 15 [IU] via SUBCUTANEOUS
  Administered 2015-12-03: 20 [IU] via SUBCUTANEOUS
  Administered 2015-12-03 – 2015-12-04 (×2): 11 [IU] via SUBCUTANEOUS
  Administered 2015-12-04 – 2015-12-05 (×3): 15 [IU] via SUBCUTANEOUS
  Administered 2015-12-05: 11 [IU] via SUBCUTANEOUS

## 2015-12-02 MED ORDER — INSULIN ASPART 100 UNIT/ML ~~LOC~~ SOLN
8.0000 [IU] | Freq: Once | SUBCUTANEOUS | Status: AC
  Administered 2015-12-02: 8 [IU] via SUBCUTANEOUS

## 2015-12-02 MED ORDER — ARFORMOTEROL TARTRATE 15 MCG/2ML IN NEBU
15.0000 ug | INHALATION_SOLUTION | Freq: Two times a day (BID) | RESPIRATORY_TRACT | Status: DC
  Administered 2015-12-02 – 2015-12-05 (×4): 15 ug via RESPIRATORY_TRACT
  Filled 2015-12-02 (×6): qty 2

## 2015-12-02 MED ORDER — INSULIN ASPART 100 UNIT/ML ~~LOC~~ SOLN
0.0000 [IU] | Freq: Every day | SUBCUTANEOUS | Status: DC
  Administered 2015-12-03: 4 [IU] via SUBCUTANEOUS
  Administered 2015-12-04: 5 [IU] via SUBCUTANEOUS

## 2015-12-02 MED ORDER — MAGIC MOUTHWASH W/LIDOCAINE
10.0000 mL | Freq: Four times a day (QID) | ORAL | Status: DC
  Administered 2015-12-02 – 2015-12-05 (×12): 10 mL via ORAL
  Filled 2015-12-02 (×15): qty 10

## 2015-12-02 MED ORDER — INSULIN ASPART 100 UNIT/ML ~~LOC~~ SOLN
5.0000 [IU] | Freq: Three times a day (TID) | SUBCUTANEOUS | Status: DC
Start: 2015-12-03 — End: 2015-12-04
  Administered 2015-12-03 (×3): 5 [IU] via SUBCUTANEOUS

## 2015-12-02 NOTE — Progress Notes (Signed)
Rt instructed pt on proper use of IS and flutter valve. Pt able to get Vt of 1250 and has good technique on both devices.

## 2015-12-02 NOTE — Care Management (Signed)
Utilization review completed. Marthann Abshier, RN Case Manager [phone]. 

## 2015-12-02 NOTE — Progress Notes (Signed)
[facility] PROGRESS NOTE  [name] [id number] DOB: Jul 09, 1961 DOA: 11/30/2015 PCP: Garret Reddish, MD  Brief Summary  Patient is a 55 yo female with history of COPD, sarcoidosis, DM, HTN who came with cc of non productive cough for 3 days associated with dyspnea and mild wheezing. No chest pain. No fever/chills. No preceding URI symptoms but feels her throat is tight. No N/V/D/C/abd pain/dysuria/rash. No pruritus or hives or angioedema. She used albuterol neb once every night for the last 2 nights w/o much relief.   Assessment/Plan  Acute COPD exacerbation, more dyspneic today Vs. Sarcoidosis flare Increase to solumedrol BID  Agree with Albuterol/ipratropium Q4H Oxygen via [place] Azithromycin for acute bronchitis ( CT scan suggest possible pharyngitis, likely viral) Change to budesonide and brovana Flu Ag negative  Sarcoidosis On chronic steroids at home, see above  Hypertension, BP elevated Continue home meds except coreg. May need to change BB to another agent such as metoprolol or bisoprolol or different class altogether  DM, hyperglycemic after increasing steroids -  Cont lantus -  Increase to 5 units aspart with meals - increase to high dose sliding scale insulin   Diastolic heart failure, Stable, no evidence of volume overload - Continue Lasix dose at 40 mg twice a day.   Diet:  Diabetic  Access:  PIV  IVF:  Off  Proph:  Lovenox   Code Status: Full code  Family Communication: Patient and her parents  Disposition Plan: Pending able to complete basic ADLs without severe dyspnea, hypoxia, possibly home tomorrow  Consultants: None  Procedures: Chest x-ray  Antibiotics:  Azithromycin 2/20   HPI/Subjective:  More dyspneic today.  RN placed her on O2 because of dyspnea, not hypoxia.  SOB even at rest and even though she had a breathing treatment only 30 minutes ago.    Objective: Filed Vitals:   12/02/15 0944 12/02/15 0956 12/02/15 1321  12/02/15 1400  BP:    151/77  Pulse: 88 85 91 80  Temp:    98.1 F (36.7 C)  TempSrc:      Resp: 18 20 20 18   Height:      Weight:      SpO2: 98% 98% 98% 97%   No intake or output data in the 24 hours ending 12/02/15 1811 Filed Weights   12/01/15 0214  Weight: 112.356 kg (247 lb 11.2 oz)   Body mass index is 40 kg/(m^2).  Exam:   General:   obese female, mild respiratory distress with SCM retractions, Frequent wheezy cough while talking and needing to pause to catch her breath  HEENT:  NCAT, MMM  Cardiovascular:  RRR, nl S1, S2 no mrg, 2+ pulses, warm extremities  Respiratory: Diminished bilateral breath sounds with high-pitched full expiratory wheeze with prolonged expiratory phase, no focal rales or rhonchi  Abdomen:   NABS, soft, NT/ND  MSK:   Normal tone and bulk, no LEE  Neuro:  Grossly intact  Data Reviewed: Basic Metabolic Panel:  Recent Labs Lab 11/30/15 1535  NA 135  K 3.5  CL 98*  CO2 23  GLUCOSE 209*  BUN 12  CREATININE 0.96  CALCIUM 9.0   Liver Function Tests: No results for input(s): AST, ALT, ALKPHOS, BILITOT, PROT, ALBUMIN in the last 168 hours. No results for input(s): LIPASE, AMYLASE in the last 168 hours. No results for input(s): AMMONIA in the last 168 hours. CBC:  Recent Labs Lab 11/30/15 1535  WBC 9.8  HGB 9.8*  HCT 30.6*  MCV 87.7  PLT 293    No results found for this or any previous visit (from the past 240 hour(s)).   Studies: Dg Neck Soft Tissue  11/30/2015  CLINICAL DATA:  Upper airway wheezing. EXAM: NECK SOFT TISSUES - 1+ VIEW COMPARISON:  None. FINDINGS: The valleculae and epiglottis are obscured. There is no evidence of retropharyngeal soft tissue swelling. The cervical airway is unremarkable. No radio-opaque foreign body identified. Patient is edentulous. IMPRESSION: Vallecular and epiglottis are obscured, significance uncertain. Neck CT, preferably with contrast could be considered for further evaluation.  Electronically Signed   By: Jeb Levering M.D.   On: 11/30/2015 21:44   Ct Soft Tissue Neck W Contrast  12/01/2015  CLINICAL DATA:  Initial evaluation for upper airway irritation. EXAM: CT NECK WITH CONTRAST TECHNIQUE: Multidetector CT imaging of the neck was performed using the standard protocol following the bolus administration of intravenous contrast. CONTRAST:  31mL OMNIPAQUE IOHEXOL 300 MG/ML  SOLN COMPARISON:  None. FINDINGS: Partially visualized brain is unremarkable. Minimal mucosal thickening within the partially visualized maxillary sinuses, right greater than left. Partially visualized paranasal sinuses are otherwise clear. Partially visualized mastoid air cells are clear. Middle ear cavities are clear. Salivary glands including the parotid glands and submandibular glands are within normal limits. Patient is edentulous. Oral cavity within normal limits. Palatine tonsils unremarkable. Parapharyngeal fat preserved. Remainder of the oropharynx and nasopharynx within normal limits. Epiglottis normal. Vallecula clear. Minimal induration within the retropharyngeal fat, which may reflect very mild pharyngitis. No frank retropharyngeal fluid collection or abscess. Remainder of the hypopharynx and supraglottic larynx within normal limits. True cords unremarkable. Subglottic airway is clear. Thyroid gland within normal limits. No adenopathy within the neck. Partially visualized superior mediastinum demonstrates no acute abnormality. Partially visualized lungs are clear. Normal intravascular enhancement seen throughout the neck. Carotid arteries are medialized into the retropharyngeal space bilaterally. Mild atheromatous plaque about the carotid bifurcations bilaterally, slightly greater on the left. No acute osseous abnormality. No worrisome lytic or blastic osseous lesions. IMPRESSION: 1. Minimal induration/ inflammatory stranding within the retropharyngeal fat, which may reflect very mild or  early/developing acute pharyngitis. No abscess or drainable fluid collection. Airway is widely patent. 2. Otherwise negative CT of the neck. Electronically Signed   By: Jeannine Boga M.D.   On: 12/01/2015 00:18    Scheduled Meds: . allopurinol  100 mg Oral QHS  . arformoterol  15 mcg Nebulization BID  . aspirin EC  81 mg Oral Daily  . atorvastatin  40 mg Oral Daily  . azithromycin  250 mg Oral Daily  . budesonide (PULMICORT) nebulizer solution  0.5 mg Nebulization BID  . buPROPion  300 mg Oral Daily  . diltiazem  120 mg Oral Daily  . enoxaparin (LOVENOX) injection  40 mg Subcutaneous Q24H  . fluticasone  2 spray Each Nare Daily  . furosemide  40 mg Oral BID  . gabapentin  100 mg Oral TID  . guaiFENesin  600 mg Oral BID  . [START ON 12/03/2015] insulin aspart  0-20 Units Subcutaneous TID WC  . insulin aspart  0-5 Units Subcutaneous QHS  . [START ON 12/03/2015] insulin aspart  5 Units Subcutaneous TID WC  . insulin glargine  30 Units Subcutaneous QHS  . ipratropium-albuterol  3 mL Nebulization TID  . magic mouthwash w/lidocaine  10 mL Oral QID  . methylPREDNISolone (SOLU-MEDROL) injection  60 mg Intravenous BID  . PARoxetine  10 mg Oral Daily  . potassium chloride SA  20 mEq Oral TID  .  terbinafine  250 mg Oral Daily  . venlafaxine XR  150 mg Oral QHS  . vitamin B-12  1,000 mcg Oral Daily   Continuous Infusions:   Active Problems:   COPD exacerbation (HCC)   SOB (shortness of breath)   Shortness of breath    Time spent: 30 min    Mireya Meditz, [facility] Pager [phone]. If 7PM-7AM, please contact night-coverage at [email or web address], password [facility] 12/02/2015, 6:11 PM  LOS: 1 day

## 2015-12-02 NOTE — Progress Notes (Signed)
Pt was on 2 Lpm at 0944. After neb treatment RT placed pt on room air - sat 98% & pt comfortable.

## 2015-12-03 ENCOUNTER — Observation Stay (HOSPITAL_COMMUNITY): Payer: BLUE CROSS/BLUE SHIELD

## 2015-12-03 DIAGNOSIS — R05 Cough: Secondary | ICD-10-CM | POA: Diagnosis not present

## 2015-12-03 DIAGNOSIS — K219 Gastro-esophageal reflux disease without esophagitis: Secondary | ICD-10-CM | POA: Insufficient documentation

## 2015-12-03 DIAGNOSIS — E1165 Type 2 diabetes mellitus with hyperglycemia: Secondary | ICD-10-CM

## 2015-12-03 DIAGNOSIS — D869 Sarcoidosis, unspecified: Secondary | ICD-10-CM | POA: Insufficient documentation

## 2015-12-03 DIAGNOSIS — R0602 Shortness of breath: Secondary | ICD-10-CM | POA: Diagnosis not present

## 2015-12-03 DIAGNOSIS — J383 Other diseases of vocal cords: Secondary | ICD-10-CM | POA: Insufficient documentation

## 2015-12-03 DIAGNOSIS — I5032 Chronic diastolic (congestive) heart failure: Secondary | ICD-10-CM

## 2015-12-03 DIAGNOSIS — Z794 Long term (current) use of insulin: Secondary | ICD-10-CM

## 2015-12-03 LAB — CBC WITH DIFFERENTIAL/PLATELET
Basophils Absolute: 0 10*3/uL (ref 0.0–0.1)
Basophils Relative: 0 %
Eosinophils Absolute: 0 10*3/uL (ref 0.0–0.7)
Eosinophils Relative: 0 %
HCT: 33.3 % — ABNORMAL LOW (ref 36.0–46.0)
Hemoglobin: 10.8 g/dL — ABNORMAL LOW (ref 12.0–15.0)
Lymphocytes Relative: 8 %
Lymphs Abs: 1.1 10*3/uL (ref 0.7–4.0)
MCH: 28.3 pg (ref 26.0–34.0)
MCHC: 32.4 g/dL (ref 30.0–36.0)
MCV: 87.2 fL (ref 78.0–100.0)
Monocytes Absolute: 0.5 10*3/uL (ref 0.1–1.0)
Monocytes Relative: 4 %
Neutro Abs: 12.6 10*3/uL — ABNORMAL HIGH (ref 1.7–7.7)
Neutrophils Relative %: 88 %
Platelets: 394 10*3/uL (ref 150–400)
RBC: 3.82 MIL/uL — ABNORMAL LOW (ref 3.87–5.11)
RDW: 14.5 % (ref 11.5–15.5)
WBC: 14.2 10*3/uL — ABNORMAL HIGH (ref 4.0–10.5)

## 2015-12-03 LAB — GLUCOSE, CAPILLARY
Glucose-Capillary: 273 mg/dL — ABNORMAL HIGH (ref 65–99)
Glucose-Capillary: 332 mg/dL — ABNORMAL HIGH (ref 65–99)
Glucose-Capillary: 361 mg/dL — ABNORMAL HIGH (ref 65–99)
Glucose-Capillary: 316 mg/dL — ABNORMAL HIGH (ref 65–99)

## 2015-12-03 MED ORDER — INSULIN GLARGINE 100 UNIT/ML ~~LOC~~ SOLN
40.0000 [IU] | Freq: Every day | SUBCUTANEOUS | Status: DC
  Administered 2015-12-03 – 2015-12-04 (×2): 40 [IU] via SUBCUTANEOUS
  Filled 2015-12-03 (×3): qty 0.4

## 2015-12-03 MED ORDER — IPRATROPIUM-ALBUTEROL 0.5-2.5 (3) MG/3ML IN SOLN
RESPIRATORY_TRACT | Status: AC
  Administered 2015-12-03: 3 mL
  Filled 2015-12-03: qty 3

## 2015-12-03 MED ORDER — ALBUTEROL SULFATE (2.5 MG/3ML) 0.083% IN NEBU: 2.5000 mg | INHALATION_SOLUTION | RESPIRATORY_TRACT | Status: DC | PRN

## 2015-12-03 MED ORDER — PANTOPRAZOLE SODIUM 40 MG PO TBEC
40.0000 mg | DELAYED_RELEASE_TABLET | Freq: Every day | ORAL | Status: DC
  Administered 2015-12-03: 40 mg via ORAL
  Filled 2015-12-03: qty 1

## 2015-12-03 MED ORDER — ACETAMINOPHEN 325 MG PO TABS
650.0000 mg | ORAL_TABLET | Freq: Four times a day (QID) | ORAL | Status: DC | PRN
  Administered 2015-12-03 – 2015-12-04 (×2): 650 mg via ORAL
  Filled 2015-12-03 (×2): qty 2

## 2015-12-03 MED ORDER — IPRATROPIUM-ALBUTEROL 0.5-2.5 (3) MG/3ML IN SOLN
3.0000 mL | RESPIRATORY_TRACT | Status: DC
  Administered 2015-12-03 – 2015-12-05 (×9): 3 mL via RESPIRATORY_TRACT
  Filled 2015-12-03 (×9): qty 3

## 2015-12-03 NOTE — Consult Note (Signed)
Name: [name] MRN: [id number] DOB: [birth year]    ADMISSION DATE:  11/30/2015 CONSULTATION DATE:  12/03/15  REFERRING MD :Tera Partridge  CHIEF COMPLAINT: COPD exacerbation  BRIEF PATIENT DESCRIPTIONPatient is a 55 yo female with history RVD, sarcoidosis, DM, HTN who came to the ED on 11/30/15 with cc of non productive cough for 3 days associated with dyspnea and mild wheezing. No chest pain. No fever/chills. No preceding URI symptoms but feels her throat is tight. No N/V/D/C/abd pain/dysuria/rash. No pruritus or hives or angioedema. She has felt more dyspneic and unable to complete one sentence, says that she get more short of breath than usual and therefore PCCM team was consulted on 12/03/15  SIGNIFICANT EVENTS  ED admission on 2/19 with SOB exacerbation  STUDIES:  CT with contrast >2/19  is negative EKG> 2/19 NSR ECHO >09/02/14 - 55-60%   HISTORY OF PRESENT ILLNESS: [name],  55 year old white female with the history of several comorbid medical conditions not limited to  Sarcoidosis, DM type-2,Hypertension, morbid obesity,GERD,CHF came to ED on 11/30/15 with acute exacerbation of SOB with complaints of non productive cough dyspnea and wheezing.  She has been followed at [facility] for management of her sarcoidosis. She is a non smoker and does not use any oxygen at home.  Patient has not felt any improvement in her condition since admission and has felt even worse as she is unable to talk even without feeling short of breath prior to completing one sentence.   PAST MEDICAL HISTORY :   has a past medical history of DIABETES MELLITUS, TYPE II (08/21/2006); HYPERLIPIDEMIA (08/21/2006); GOUT (08/20/2010); OBESITY (06/04/2009); ANEMIA-UNSPECIFIED (09/18/2009); HYPERTENSION (08/21/2006); GERD (08/21/2006); Internal hemorrhoids; Pulmonary sarcoidosis ([place]); Exertional chest pain; Vocal cord dysfunction; Chronic diastolic heart failure, NYHA class 2 (HCC); CHF (congestive heart failure)  ([place]); COPD (chronic obstructive pulmonary disease) ([place]); Depression; Fracture of 5th metatarsal; Abnormal SPEP (04/17/2014); Osteomyelitis of left foot ([place]) (05/29/2015); Diabetic osteomyelitis ([place]) ([id number]); umbilical hernia repair; Infection of wound due to methicillin resistant Staphylococcus aureus (MRSA); Wears partial dentures; and Onychomycosis (10/27/2015).  has past surgical history that includes Ventral hernia repair; Nissen fundoplication (123XX123); Cholecystectomy (1984); Abdominal hysterectomy; Knee arthroscopy; Tubal ligation; Bladder suspension (11/11/2011); Cystoscopy (11/11/2011); doppler echocardiography (02/12/2013); LexiScan Myoview (03/09/2013); Cardiac catheterization (07/2010); CAROTIDS (02/18/11); Right and left CARDIAC CATHETERIZATION (04/23/2013); Metatarsal osteotomy with open reduction, internal fixation (orif) metatarsal with fusion (Left, 04/09/2014); left and right heart catheterization with coronary angiogram (N/A, 04/23/2013); Hernia repair; Appendectomy; Fracture surgery; and I&D extremity (Left, 06/27/2015). Prior to Admission medications   Medication Sig Start Date End Date Taking? Authorizing Provider  acetaminophen (TYLENOL) 325 MG tablet Take 650 mg by mouth every 6 (six) hours as needed for headache. Reported on 11/12/2015   Yes Historical Provider, MD  albuterol (PROVENTIL HFA;VENTOLIN HFA) 108 (90 BASE) MCG/ACT inhaler Inhale 1-2 puffs into the lungs every 6 (six) hours as needed for wheezing or shortness of breath. 09/25/15  Yes Praveen Mannam, MD  allopurinol (ZYLOPRIM) 100 MG tablet Take 100 mg by mouth at bedtime.    Yes Historical Provider, MD  aspirin EC 81 MG tablet Take 81 mg by mouth daily.    Yes Historical Provider, MD  atorvastatin (LIPITOR) 40 MG tablet TAKE ONE TABLET BY MOUTH ONE TIME DAILY 05/06/15  Yes [name], MD  benzonatate (TESSALON) 100 MG capsule Take 100 mg by mouth 3 (three) times daily as needed for cough.   Yes Historical Provider, MD   buPROPion (WELLBUTRIN XL) 300 MG  24 hr tablet Take 1 tablet (300 mg total) by mouth daily. 10/03/15  Yes [name], MD  carvedilol (COREG) 25 MG tablet Take 1 tablet (25 mg total) by mouth 2 (two) times daily with a meal. 11/20/15  Yes Leonie Man, MD  chlorpheniramine-HYDROcodone (TUSSIONEX) 10-8 MG/5ML SUER Take 5 mLs by mouth daily as needed for cough.  11/03/15  Yes Historical Provider, MD  cholecalciferol 5000 UNITS TABS Take 1 tablet (5,000 Units total) by mouth daily. Patient taking differently: Take 5,000 Units by mouth every morning.  04/09/14  Yes Kirstin Shepperson, PA-C  DEXILANT 60 MG capsule TAKE ONE CAPSULE BY MOUTH ONE TIME DAILY 03/14/15  Yes Elsie Stain, MD  diltiazem (CARTIA XT) 120 MG 24 hr capsule Take 1 capsule (120 mg total) by mouth daily. 10/21/15  Yes Leonie Man, MD  estradiol (ESTRACE) 2 MG tablet Take 2 mg by mouth at bedtime.  01/24/14  Yes Historical Provider, MD  fenofibrate (TRICOR) 145 MG tablet TAKE ONE TABLET BY MOUTH ONE TIME DAILY  Patient taking differently: TAKE ONE TABLET BY MOUTH every evening 11/05/14  Yes Leonie Man, MD  fluticasone [facility] 50 MCG/ACT nasal spray USE TWO SPRAYS IN [facility] NOSTRIL DAILY 02/03/15  Yes [name], MD  furosemide (LASIX) 40 MG tablet Take 1 tablet (40 mg total) by mouth 2 (two) times daily. PATIENT NEEDS TO CONTACT OFFICE FOR ADDITIONAL REFILLS 11/12/15  Yes [name], MD  gabapentin (NEURONTIN) 100 MG capsule Take 1 capsule (100 mg total) by mouth 3 (three) times daily. **PT NEEDS AN APPT FOR FURTHER REFILLS** 07/21/15  Yes Philemon Kingdom, MD  insulin aspart (NOVOLOG FLEXPEN) 100 UNIT/ML FlexPen Inject 7-12 Units into the skin 3 (three) times daily with meals. Per sliding scale   Yes Historical Provider, MD  losartan (COZAAR) 100 MG tablet Take 1 tablet (100 mg total) by mouth daily. 11/20/15  Yes Leonie Man, MD  metFORMIN (GLUCOPHAGE) 1000 MG tablet TAKE ONE TABLET TWICE DAILY WITH MEALS 11/27/15   Yes Philemon Kingdom, MD  mometasone-formoterol [facility]) 200-5 MCG/ACT AERO Inhale 2 puffs into the lungs 2 (two) times daily. 09/25/15  Yes Praveen Mannam, MD  nitroGLYCERIN (NITROSTAT) 0.4 MG SL tablet Place 0.4 mg under the tongue every 5 (five) minutes as needed. Reported on 11/12/2015   Yes Historical Provider, MD  ondansetron (ZOFRAN-ODT) 4 MG disintegrating tablet Take 4 mg by mouth every 8 (eight) hours as needed for nausea or vomiting. Reported on 11/12/2015 03/31/15  Yes Historical Provider, MD  PARoxetine Mesylate (BRISDELLE) 7.5 MG CAPS Take 7.5 mg by mouth daily.   Yes Historical Provider, MD  Polyethyl Glycol-Propyl Glycol (SYSTANE) 0.4-0.3 % SOLN Apply 1 drop to eye at bedtime.   Yes Historical Provider, MD  potassium chloride SA (K-DUR,KLOR-CON) 20 MEQ tablet Take 1 tablet (20 mEq total) by mouth 3 (three) times daily. 05/01/15  Yes Thurnell Lose, MD  predniSONE (DELTASONE) 5 MG tablet Take 8 tabs orally by mouth for 2 days, then take 6 tabs orally by mouth for 2 days, then take 4 tabs orally by mouth for 2 days, then take 2 tabs orally by mouth for 2 days, then continue your regular dose of 5 mg. Patient taking differently: Take 5 mg by mouth daily with breakfast.  10/18/15  Yes Verlee Monte, MD  terbinafine (LAMISIL) 250 MG tablet Take 1 tablet (250 mg total) by mouth daily. 10/27/15  Yes Truman Hayward, MD  [place] 300  UNIT/ML SOPN INJECT 40 UNITS INTO THE SKIN AT BEDTIME 10/29/15  Yes Philemon Kingdom, MD  venlafaxine XR (EFFEXOR-XR) 75 MG 24 hr capsule Take 1 capsule (75 mg total) by mouth 2 (two) times daily. Patient taking differently: Take 150 mg by mouth at bedtime.  10/03/15  Yes [name], MD  vitamin B-12 (CYANOCOBALAMIN) 1000 MCG tablet Take 1,000 mcg by mouth daily.   Yes Historical Provider, MD  B-D ULTRAFINE III SHORT PEN 31G X 8 MM MISC FOLLOW DIRECTIONS PROVIDED BY PHYSICIAN 05/06/15   [name], MD  fluconazole (DIFLUCAN) 100 MG tablet Take 2  tablets the first day and 1 tablet daily until finished 11/24/15   Irene Shipper, MD  glucose blood (ONE TOUCH ULTRA TEST) test strip 1 each by Other route 3 (three) times daily. Use as instructed 10/19/13   Lisabeth Pick, MD  hyoscyamine (LEVSIN/SL) 0.125 MG SL tablet 1-2 tablets sub lingually every 4 hours as needed for abdominal pain and cramping 11/24/15   Irene Shipper, MD  Insulin Pen Needle 31G X 8 MM MISC Use to inject insulin 4 times daily. 04/30/15   Philemon Kingdom, MD  promethazine (PHENERGAN) 25 MG tablet Take 1 tablet (25 mg total) by mouth every 6 (six) hours as needed for nausea or vomiting. 10/04/15   Stevi Barrett, PA-C   Allergies  Allergen Reactions  . Methotrexate Other (See Comments)     peri-oral and buccal lesions.  . Vancomycin Other (See Comments)    nephrotoxicity  . Clindamycin/Lincomycin Nausea And Vomiting and Rash  . Chlorhexidine Itching  . Doxycycline Rash  . Lisinopril Cough  . Teflaro [Ceftaroline] Rash    FAMILY HISTORY:  family history includes Asthma in her mother; COPD in her mother; Coronary artery disease in her father; Diabetes in her brother and father; Emphysema in her mother; Heart attack in her father and maternal grandfather; Heart failure in her father and mother; Lung cancer in her brother; Sarcoidosis in her maternal uncle. There is no history of Colon cancer. SOCIAL HISTORY:  reports that she has never smoked. She has never used smokeless tobacco. She reports that she does not drink alcohol or use illicit drugs.  REVIEW OF SYSTEMS:   Constitutional: Negative for fever, chills, weight loss, malaise/fatigue and diaphoresis.  HENT: Negative for hearing loss, ear pain, nosebleeds, congestion, sore throat, neck pain, tinnitus and ear discharge.   Eyes: Negative for blurred vision, double vision, photophobia, pain, discharge and redness.  Respiratory: pt with sob, cough, wheeze.  Cardiovascular: Negative for chest pain, palpitations, orthopnea,  claudication, leg swelling and PND.  Gastrointestinal: Negative for heartburn, nausea, vomiting, abdominal pain, diarrhea, constipation, blood in stool and melena.  Genitourinary: Negative for dysuria, urgency, frequency, hematuria and flank pain.  Musculoskeletal: Negative for myalgias, back pain, joint pain and falls.  Skin: Negative for itching and rash.  Neurological: Negative for dizziness, tingling, tremors, sensory change, speech change, focal weakness, seizures, loss of consciousness, weakness and headaches.  Endo/Heme/Allergies: Negative for environmental allergies and polydipsia. Does not bruise/bleed easily.  SUBJECTIVE:   Morbidly Obese female, on room air, tachypnic not using any accessory muscles complaining of feeling excessive shortness of breath evidenced by unable to speak in full sentences.  VITAL SIGNS: Temp:  [97.4 F (36.3 C)-98.1 F (36.7 C)] 98 F (36.7 C) (02/22 0349) Pulse Rate:  [70-93] 70 (02/22 0349) Resp:  [18-20] 18 (02/22 0349) BP: (151-160)/(66-77) 158/66 mmHg (02/22 0349) SpO2:  [97 %-99 %] 98 % (02/22  0800)  PHYSICAL EXAMINATION: General:  Morbidly obese, white female,on RA Neuro:  Alert , oriented, neuro intact HEENT:  Atraumatic, normocephalic, white sclera, no dishage Cardiovascular:  S1,S2, rrr. No MGR Lungs:  Bibasilar wheezes, no use of accessory muscle Abdomen:  Obese,nonder, good bowel sounds Musculoskeletal:  Good tone Skin:  intact   Recent Labs Lab 11/30/15 1535  NA 135  K 3.5  CL 98*  CO2 23  BUN 12  CREATININE 0.96  GLUCOSE 209*    Recent Labs Lab 11/30/15 1535  HGB 9.8*  HCT 30.6*  WBC 9.8  PLT 293   No results found.  ASSESSMENT / PLAN:  Acute COPD exacerbation, Sarcoidosis    Continue Solumedrol  Continue  Albuterol/ipratropium Q4H  Oxygen  If needed to keep sats >92%  ContinueAzithromycin for acute bronchitis   CXR to r/o pneumonia      Follow up with CBC,BMP    BiPAP if needed at hs    Continue  flonase    Continue budenoside    Continue guaifenesin     Continue prednisone as recommended by [facility]   Diastolic heart failure, Stable, no evidence of volume overload Continue lasix         Bincy Varughese,AG-ACNP Pulmonary [facility] Care [facility] and [place] Pager: [phone]  12/03/2015, 11:30 AM  ATTENDING NOTE: I  have personally reviewed patient's available data, including medical history, events of note, physical examination and test results as part of my evaluation. I have discussed with Bincy Varughese.  I agree with the physical examination, assessment, and plan as outlined. Additional physical examination/comments/assessment/plan as written below.  Briefly, patient is a 56 y.o. female admitted for worsening SOB. Patient has sarcoidosis ( recent chest CT scan did not show any pulmonary involvement) being seen by Dr.Mannam and she goes to [facility] a [facility], also with vocal cord dysfunction. PFTs in the past did not reveal COPD. On chronic prednisone at 5 mg per day. She is also on Dulera, 2 puffs twice a day. Last seen by [facility] pulmonary December 2016. She was doing well until one week ago. With some cough, congestion, wheezing, dyspnea. She was admitted as a possible hyperreactive airway disease flareup.  Chest x-ray and initial workup was negative for any INFECTION. She had wheezing which has persisted.  Physical examination mentioned. Obese lady, not in distress. Vital signs stable. Wheezing bilaterally, mild to moderate, worse posteriorly. Grade 1 edema. Rest of exam as mentioned.  A> possible sarcoidosis flareup. Possible vocal cord dysfunction or hyperreactive airway disease flareup. As mentioned, no COPD based on PFT. Bronchitis. Sleep study in the past has been negative.  P>  Chest x-ray today did not reveal any new infiltrate. CBC today showed mild leukocytosis probably from steroids. I will continue current  treatment. Start Pulmicort twice a day, Brovana twice a day, DuoNeb 4 times a day. Patient may end up needing BiPAP 12/5 if her symptoms worsen. During that time, she needs to be transferred to the stepdown. At present, she is comfortably sitting. She will mentioned to the nurse if her symptoms worsen. Keep O2 saturation more than 80-90%. DVT prophylaxis. She may end up needing a long prednisone taper. She is usually on 5 mg a day of prednisone. May need to treat for reflux. May need to treat for upper airway cough syndrome with Flonase 2 squirts per nostril at bedtime.    Elsie Saas A. Corrie Dandy, MD 12/03/2015, 5:09 PM [facility] Pager [phone] After 3 pm or if no answer, call [phone]

## 2015-12-03 NOTE — Progress Notes (Signed)
[facility] PROGRESS NOTE  [name] HOLTZER [id number] DOB: 04/10/1961 DOA: 11/30/2015 PCP: Garret Reddish, MD  Brief Summary  Patient is a 55 yo female with history of COPD, sarcoidosis, DM, HTN who came with cc of non productive cough for 3 days associated with dyspnea and mild wheezing. No chest pain. No fever/chills. No preceding URI symptoms but feels her throat is tight. No N/V/D/C/abd pain/dysuria/rash. No pruritus or hives or angioedema. She used albuterol neb once every night for the last 2 nights pta w/o much relief. Admitted for COPD exacerbation versus sarcoidosis flare. Initially was on oral prednisone but due to worsening dyspnea, changed to IV Solu-Medrol on 2/21. No significant improvement. PCCM consulted on 2/22.  Assessment/Plan  Acute COPD exacerbation,  Vs. Sarcoidosis flare (follows with [facility] and [facility] pulmonology/Dr. Casper Harrison) Initially placed on prednisone but changed to IV Solu-Medrol 60 MG BID on 2/21, due to worsening dyspnea. Continue Albuterol/ipratropium Q4H Oxygen via [place] Azithromycin for acute bronchitis ( CT scan suggest possible pharyngitis, likely viral) Change to budesonide and brovana Flu Ag negative PCCM consulted on 2/22. Lifelong nonsmoker.  Sarcoidosis On chronic steroids at home, see above  Hypertension, BP elevated Continue home meds except coreg. May need to change BB to another agent such as metoprolol or bisoprolol or different class altogether  DM, hyperglycemic after increasing steroids -  Increased Lantus to home dose of 40 units at bedtime -  Increased to 5 units aspart with meals - increased to high dose sliding scale insulin - Monitor.   Chronic Diastolic heart failure, Stable, no evidence of volume overload - Continue Lasix dose at 40 mg twice a day.  Anemia - Stable.  GERD - History of vocal cord dysfunction. Resume PPI.   Diet:  Diabetic  Access:  PIV  IVF:  Off  Proph:  Lovenox   Code Status:  Full code  Family Communication: Discussed with patient's parents at bedside on 2/22 Disposition Plan: DC home when medically stable. Not medically stable for discharge.  Consultants:  PCCM Procedures: Chest x-ray  Antibiotics:  Azithromycin 2/20   HPI/Subjective:  Indicates that her dyspnea has not significantly improved or may be minimally improved compared to yesterday. Wheezing. Mild headache.  Objective: Filed Vitals:   12/03/15 0800 12/03/15 1239 12/03/15 1322 12/03/15 1454  BP:   156/71   Pulse:   101   Temp:   98.2 F (36.8 C)   TempSrc:   Oral   Resp:   18   Height:      Weight:      SpO2: 98% 95% 98% 98%    Intake/Output Summary (Last 24 hours) at 12/03/15 1613 Last data filed at 12/03/15 1230  Gross per 24 hour  Intake    480 ml  Output      0 ml  Net    480 ml   Filed Weights   12/01/15 0214  Weight: 112.356 kg (247 lb 11.2 oz)   Body mass index is 40 kg/(m^2).  Exam:   General:   Middle-aged moderately built and obese female sitting up in bed in no obvious distress but audible wheezing.  Cardiovascular:  RRR, nl S1, S2 no mrg, 2+ pulses, warm extremities  Respiratory: Diminished bilateral breath sounds with high-pitched full expiratory wheeze with prolonged expiratory phase, no focal rales or rhonchi. No increased work of breathing. Able to speak in short sentences.  Abdomen:   NABS, soft, NT/ND  MSK:   Normal tone and bulk, no LEE  Neuro:  Grossly intact. Alert and oriented.  Data Reviewed: Basic Metabolic Panel:  Recent Labs Lab 11/30/15 1535  NA 135  K 3.5  CL 98*  CO2 23  GLUCOSE 209*  BUN 12  CREATININE 0.96  CALCIUM 9.0   Liver Function Tests: No results for input(s): AST, ALT, ALKPHOS, BILITOT, PROT, ALBUMIN in the last 168 hours. No results for input(s): LIPASE, AMYLASE in the last 168 hours. No results for input(s): AMMONIA in the last 168 hours. CBC:  Recent Labs Lab 11/30/15 1535 12/03/15 1234  WBC 9.8 14.2*   NEUTROABS  --  12.6*  HGB 9.8* 10.8*  HCT 30.6* 33.3*  MCV 87.7 87.2  PLT 293 394    No results found for this or any previous visit (from the past 240 hour(s)).   Studies: Dg Chest Port 1 View  12/03/2015  CLINICAL DATA:  Shortness of breath. Chest tightness. Dry cough. Sarcoidosis. EXAM: PORTABLE CHEST 1 VIEW COMPARISON:  11/30/2015. FINDINGS: Stable enlarged cardiac silhouette. Mild central peribronchial thickening with progression. Clear lungs. No pleural fluid. Mild lower thoracic spine degenerative changes. IMPRESSION: Mild bronchitic changes. Electronically Signed   By: Claudie Revering M.D.   On: 12/03/2015 12:48    Scheduled Meds: . allopurinol  100 mg Oral QHS  . arformoterol  15 mcg Nebulization BID  . aspirin EC  81 mg Oral Daily  . atorvastatin  40 mg Oral Daily  . azithromycin  250 mg Oral Daily  . budesonide (PULMICORT) nebulizer solution  0.5 mg Nebulization BID  . buPROPion  300 mg Oral Daily  . diltiazem  120 mg Oral Daily  . enoxaparin (LOVENOX) injection  40 mg Subcutaneous Q24H  . fluticasone  2 spray Each Nare Daily  . furosemide  40 mg Oral BID  . gabapentin  100 mg Oral TID  . guaiFENesin  600 mg Oral BID  . insulin aspart  0-20 Units Subcutaneous TID WC  . insulin aspart  0-5 Units Subcutaneous QHS  . insulin aspart  5 Units Subcutaneous TID WC  . insulin glargine  40 Units Subcutaneous QHS  . ipratropium-albuterol  3 mL Nebulization Q4H  . ipratropium-albuterol      . magic mouthwash w/lidocaine  10 mL Oral QID  . methylPREDNISolone (SOLU-MEDROL) injection  60 mg Intravenous BID  . pantoprazole  40 mg Oral Daily  . PARoxetine  10 mg Oral Daily  . potassium chloride SA  20 mEq Oral TID  . terbinafine  250 mg Oral Daily  . venlafaxine XR  150 mg Oral QHS  . vitamin B-12  1,000 mcg Oral Daily   Continuous Infusions:   Active Problems:   COPD exacerbation (HCC)   SOB (shortness of breath)   Shortness of breath   Chronic diastolic heart failure  (HCC)    Time spent: 25 min   Young Brim, MD, FACP, FHM. [facility] Pager [phone]  If 7PM-7AM, please contact night-coverage [email or web address] Password [facility] 12/03/2015, 4:13 PM    LOS: 2 days

## 2015-12-04 DIAGNOSIS — J385 Laryngeal spasm: Secondary | ICD-10-CM | POA: Insufficient documentation

## 2015-12-04 DIAGNOSIS — J441 Chronic obstructive pulmonary disease with (acute) exacerbation: Secondary | ICD-10-CM

## 2015-12-04 LAB — GLUCOSE, CAPILLARY
Glucose-Capillary: 325 mg/dL — ABNORMAL HIGH (ref 65–99)
Glucose-Capillary: 272 mg/dL — ABNORMAL HIGH (ref 65–99)
Glucose-Capillary: 317 mg/dL — ABNORMAL HIGH (ref 65–99)
Glucose-Capillary: 365 mg/dL — ABNORMAL HIGH (ref 65–99)

## 2015-12-04 MED ORDER — INSULIN GLARGINE 100 UNIT/ML ~~LOC~~ SOLN
10.0000 [IU] | Freq: Once | SUBCUTANEOUS | Status: AC
  Administered 2015-12-04: 10 [IU] via SUBCUTANEOUS
  Filled 2015-12-04: qty 0.1

## 2015-12-04 MED ORDER — INSULIN ASPART 100 UNIT/ML ~~LOC~~ SOLN
8.0000 [IU] | Freq: Three times a day (TID) | SUBCUTANEOUS | Status: DC
  Administered 2015-12-04 – 2015-12-05 (×5): 8 [IU] via SUBCUTANEOUS

## 2015-12-04 MED ORDER — PANTOPRAZOLE SODIUM 40 MG PO TBEC
40.0000 mg | DELAYED_RELEASE_TABLET | Freq: Two times a day (BID) | ORAL | Status: DC
  Administered 2015-12-04 – 2015-12-05 (×3): 40 mg via ORAL
  Filled 2015-12-04 (×2): qty 1

## 2015-12-04 NOTE — Progress Notes (Addendum)
Inpatient Diabetes Program Recommendations  AACE/ADA: New Consensus Statement on Inpatient Glycemic Control (2015)  Target Ranges:  Prepandial:   less than 140 mg/dL      Peak postprandial:   less than 180 mg/dL (1-2 hours)      Critically ill patients:  140 - 180 mg/dL   Results for NAHIMA, NOTHNAGEL (MRN [id number]) as of 12/04/2015 09:55  Ref. Range 12/03/2015 06:41 12/03/2015 11:13 12/03/2015 16:04 12/03/2015 22:03 12/04/2015 06:35  Glucose-Capillary Latest Ref Range: 65-99 mg/dL 273 (H) 332 (H) 361 (H) 316 (H) 325 (H)   Review of Glycemic Control  Diabetes history: DM 2 Outpatient Diabetes medications: Toujeo 40, Novolog 7-10 units TID, Metformin 1,000 mg BID Current orders for Inpatient glycemic control: Lantus 40 units, Novolog Resistant, Novolog 8 units MC, Novolog HS scale  Inpatient Diabetes Program Recommendations: Insulin - Basal: Fasting glucose in 300's this am due to steroids. Please consider increasing basal to 50 units QHS. May want to give additional 10 units this am.  Thanks,  Tama Headings RN, MSN, [facility] Inpatient Diabetes Coordinator Team Pager [phone] (8a-5p)

## 2015-12-04 NOTE — Progress Notes (Signed)
[facility] PROGRESS NOTE  [name] [id number] DOB: 06/21/1961 DOA: 11/30/2015 PCP: Garret Reddish, MD  Brief Summary  Patient is a 55 yo female with history of COPD, sarcoidosis, DM, HTN who came with cc of non productive cough for 3 days associated with dyspnea and mild wheezing. No chest pain. No fever/chills. No preceding URI symptoms but feels her throat is tight. No N/V/D/C/abd pain/dysuria/rash. No pruritus or hives or angioedema. She used albuterol neb once every night for the last 2 nights pta w/o much relief. Admitted for COPD exacerbation versus sarcoidosis flare. Initially was on oral prednisone but due to worsening dyspnea, changed to IV Solu-Medrol on 2/21. No significant improvement. PCCM consulted on 2/22.  Assessment/Plan  Possible sarcoidosis flareup/vocal cord dysfunction/hyperactive airway disease flare up (follows with [facility] and [facility] pulmonology/Dr. Casper Harrison) Initially placed on prednisone but changed to IV Solu-Medrol 60 MG BID on 2/21, due to worsening dyspnea. Continue Albuterol/ipratropium Q4H Oxygen via [place] Azithromycin for acute bronchitis ( CT scan suggest possible pharyngitis, likely viral) Change to budesonide and brovana Flu Ag negative PCCM consulted on 2/22. Indicate that patient likely has flareup of sarcoidosis versus vocal cord dysfunction versus hyperactive airway disease. No COPD based on PFTs. Sleep study negative in the past. Lifelong nonsmoker. Improved compared to yesterday. Continue current management and consider transitioning to oral prednisone at discharge in a.m.  Sarcoidosis On chronic steroids at home, see above  Hypertension, BP elevated Continue home meds except coreg. May need to change BB to another agent such as metoprolol or bisoprolol or different class altogether. Reasonable control.  DM, hyperglycemic after increasing steroids -  Increased Lantus to home dose of 40 units at bedtime on 2/22. CBGs still  high. Gave 10 units Lantus 1 dose 2/23. Consider increasing Lantus to 50 units bedtime from 2/24 if she remains in the hospital. -  Increased to 8 units aspart with meals - increased to high dose sliding scale insulin - Monitor.   Chronic Diastolic heart failure, Stable, no evidence of volume overload - Continue Lasix dose at 40 mg twice a day.  Anemia - Stable.  GERD - History of vocal cord dysfunction. Resumed PPI.   Diet:  Diabetic  Access:  PIV  IVF:  Off  Proph:  Lovenox   Code Status: Full code  Family Communication: Discussed with patient's parents at bedside on 2/22. None at bedside today. Disposition Plan: DC home when medically stable. Not medically stable for discharge.  Consultants:  PCCM Procedures: Chest x-ray  Antibiotics:  Azithromycin 2/20   HPI/Subjective:  Indicates that she feels better compared to yesterday. Reports dyspnea is 30% improved. No other complaints reported.  Objective: Filed Vitals:   12/04/15 0014 12/04/15 0353 12/04/15 0838 12/04/15 1158  BP:      Pulse:      Temp:      TempSrc:      Resp:      Height:      Weight:      SpO2: 99% 98% 98% 98%   temperature 98.33F, pulse 85/m, respiration 18/m, blood pressure 157/73 mmHg.  Intake/Output Summary (Last 24 hours) at 12/04/15 1312 Last data filed at 12/04/15 1000  Gross per 24 hour  Intake    480 ml  Output      0 ml  Net    480 ml   Filed Weights   12/01/15 0214  Weight: 112.356 kg (247 lb 11.2 oz)   Body mass index is 40 kg/(m^2).  Exam:  General:   Middle-aged moderately built and obese female sitting up in bed in no obvious distress. No audible wheezing.  Cardiovascular:  RRR, nl S1, S2 no mrg, 2+ pulses, [name] extremities  Respiratory: Much improved breath sounds bilaterally. Occasional bilateral expiratory rhonchi. No crackles.  Abdomen:   NABS, soft, NT/ND  MSK:   Normal tone and bulk, no LEE  Neuro:  Grossly intact. Alert and oriented.  Data  Reviewed: Basic Metabolic Panel:  Recent Labs Lab 11/30/15 1535  NA 135  K 3.5  CL 98*  CO2 23  GLUCOSE 209*  BUN 12  CREATININE 0.96  CALCIUM 9.0   Liver Function Tests: No results for input(s): AST, ALT, ALKPHOS, BILITOT, PROT, ALBUMIN in the last 168 hours. No results for input(s): LIPASE, AMYLASE in the last 168 hours. No results for input(s): AMMONIA in the last 168 hours. CBC:  Recent Labs Lab 11/30/15 1535 12/03/15 1234  WBC 9.8 14.2*  NEUTROABS  --  12.6*  HGB 9.8* 10.8*  HCT 30.6* 33.3*  MCV 87.7 87.2  PLT 293 394    No results found for this or any previous visit (from the past 240 hour(s)).   Studies: Dg Chest Port 1 View  12/03/2015  CLINICAL DATA:  Shortness of breath. Chest tightness. Dry cough. Sarcoidosis. EXAM: PORTABLE CHEST 1 VIEW COMPARISON:  11/30/2015. FINDINGS: Stable enlarged cardiac silhouette. Mild central peribronchial thickening with progression. Clear lungs. No pleural fluid. Mild lower thoracic spine degenerative changes. IMPRESSION: Mild bronchitic changes. Electronically Signed   By: Claudie Revering M.D.   On: 12/03/2015 12:48    Scheduled Meds: . allopurinol  100 mg Oral QHS  . arformoterol  15 mcg Nebulization BID  . aspirin EC  81 mg Oral Daily  . atorvastatin  40 mg Oral Daily  . azithromycin  250 mg Oral Daily  . budesonide (PULMICORT) nebulizer solution  0.5 mg Nebulization BID  . buPROPion  300 mg Oral Daily  . diltiazem  120 mg Oral Daily  . enoxaparin (LOVENOX) injection  40 mg Subcutaneous Q24H  . fluticasone  2 spray Each Nare Daily  . furosemide  40 mg Oral BID  . gabapentin  100 mg Oral TID  . guaiFENesin  600 mg Oral BID  . insulin aspart  0-20 Units Subcutaneous TID WC  . insulin aspart  0-5 Units Subcutaneous QHS  . insulin aspart  8 Units Subcutaneous TID WC  . insulin glargine  40 Units Subcutaneous QHS  . ipratropium-albuterol  3 mL Nebulization Q4H  . magic mouthwash w/lidocaine  10 mL Oral QID  .  methylPREDNISolone (SOLU-MEDROL) injection  60 mg Intravenous BID  . pantoprazole  40 mg Oral BID AC  . PARoxetine  10 mg Oral Daily  . potassium chloride SA  20 mEq Oral TID  . terbinafine  250 mg Oral Daily  . venlafaxine XR  150 mg Oral QHS  . vitamin B-12  1,000 mcg Oral Daily   Continuous Infusions:   Active Problems:   COPD exacerbation (HCC)   SOB (shortness of breath)   Shortness of breath   Chronic diastolic heart failure (HCC)   Sarcoidosis (HCC)   Vocal cord dysfunction   Gastroesophageal reflux disease without esophagitis    Time spent: 15 min   Davanta Meuser, MD, FACP, FHM. [facility] Pager [phone]  If 7PM-7AM, please contact night-coverage [email or web address] Password [email or web address] 12/04/2015, 1:12 PM    LOS: 3 days

## 2015-12-04 NOTE — Progress Notes (Signed)
Name: [name] MRN: [id number] DOB: 1961-01-25    ADMISSION DATE:  11/30/2015 CONSULTATION DATE:  12/03/15  REFERRING MD :Tera Partridge  CHIEF COMPLAINT: COPD exacerbation  BRIEF PATIENT DESCRIPTION  55 yo female with history VCD, sarcoidosis (no pulm involvement on recent CT chest), DM, HTN.  PFT's in the past did not reveal COPD.  On chronic pred 5mg /day followed by Dr. Vaughan Browner and [facility].  Presented on 11/30/15 with cc of non productive cough for 3 days associated with dyspnea and mild wheezing.  CXR and initial w/u neg for infectious process.  PCCM consulted 2/22.    SIGNIFICANT EVENTS  ED admission on 2/19 with SOB exacerbation  STUDIES:  CT with contrast >2/19  is negative EKG> 2/19 NSR ECHO >09/02/14 - 55-60%   SUBJECTIVE:  Feeling much better.  Still "not quite back to normal" but much improved.  Talking on phone comfortably on my arrival.   VITAL SIGNS: Temp:  [98.2 F (36.8 C)] 98.2 F (36.8 C) (02/22 2028) Pulse Rate:  [85-101] 85 (02/22 2028) Resp:  [18] 18 (02/22 2028) BP: (156-157)/(71-73) 157/73 mmHg (02/22 2028) SpO2:  [95 %-99 %] 98 % (02/23 0838) FiO2 (%):  [21 %] 21 % (02/22 1454)  PHYSICAL EXAMINATION: General:  Morbidly obese, white female,on RA Neuro:  Alert , oriented, neuro intact HEENT:  Atraumatic, normocephalic, white sclera, no disharge Cardiovascular:  S1,S2, rrr. No MGR Lungs: resps even non labored, speaking in full sentences, upper airway wheeze Abdomen:  Obese,nonder, good bowel sounds Musculoskeletal:  Good tone Skin:  intact   Recent Labs Lab 11/30/15 1535  NA 135  K 3.5  CL 98*  CO2 23  BUN 12  CREATININE 0.96  GLUCOSE 209*    Recent Labs Lab 11/30/15 1535 12/03/15 1234  HGB 9.8* 10.8*  HCT 30.6* 33.3*  WBC 9.8 14.2*  PLT 293 394   Dg Chest Port 1 View  12/03/2015  CLINICAL DATA:  Shortness of breath. Chest tightness. Dry cough. Sarcoidosis. EXAM: PORTABLE CHEST 1 VIEW COMPARISON:  11/30/2015. FINDINGS: Stable  enlarged cardiac silhouette. Mild central peribronchial thickening with progression. Clear lungs. No pleural fluid. Mild lower thoracic spine degenerative changes. IMPRESSION: Mild bronchitic changes. Electronically Signed   By: Claudie Revering M.D.   On: 12/03/2015 12:48    ASSESSMENT / PLAN:  Possible flare sarcoid +/- VCD/hyppereactive airway disease flare.  Improved with IV steroids, BD's.  Bronchitis   PLAN -  Continue solumedrol with slow taper -- maintenance 5mg /day pred  Continue budesonide, duonebs - change to q6  Supplemental O2 if needed  Continue azithro  Continue flonase pulm hygiene    outpt pulm f/u    Nickolas Madrid, NP 12/04/2015  10:53 AM Pager: [phone] or [phone]  STAFF NOTE: I, Merrie Roof, MD FACP have personally reviewed patient's available data, including medical history, events of note, physical examination and test results as part of my evaluation. I have discussed with resident/NP and other care providers such as pharmacist, RN and RRT. In addition, I personally evaluated patient and elicited key findings of: No distress, she reports less sob and feeling better, she has a faint end exp wheezing from cords radiating to bases chest posterior, seems a slow slow taper steroids is important given her history and dependence, would transition to oral prednisone, continued current regimen otherwise and complete full course azithromycin, likley this is started by atypical / viral infection, needs to see DR mannam outpt upon dc  Lavon Paganini. Titus Mould, MD, [place] Pgr:  [id number] [facility] 12/04/2015 4:00 PM

## 2015-12-05 ENCOUNTER — Other Ambulatory Visit: Payer: Self-pay

## 2015-12-05 ENCOUNTER — Telehealth: Payer: Self-pay | Admitting: Family Medicine

## 2015-12-05 LAB — GLUCOSE, CAPILLARY
Glucose-Capillary: 322 mg/dL — ABNORMAL HIGH (ref 65–99)
Glucose-Capillary: 300 mg/dL — ABNORMAL HIGH (ref 65–99)

## 2015-12-05 MED ORDER — IPRATROPIUM-ALBUTEROL 0.5-2.5 (3) MG/3ML IN SOLN
3.0000 mL | Freq: Four times a day (QID) | RESPIRATORY_TRACT | Status: DC
  Administered 2015-12-05: 3 mL via RESPIRATORY_TRACT
  Filled 2015-12-05 (×2): qty 3

## 2015-12-05 MED ORDER — PREDNISONE 20 MG PO TABS
50.0000 mg | ORAL_TABLET | Freq: Every day | ORAL | Status: DC
  Administered 2015-12-05: 50 mg via ORAL
  Filled 2015-12-05: qty 2

## 2015-12-05 MED ORDER — PREDNISONE 5 MG PO TABS: 5.0000 mg | ORAL_TABLET | Freq: Every day | ORAL | Status: DC

## 2015-12-05 MED ORDER — PREDNISONE 10 MG PO TABS: 50.0000 mg | ORAL_TABLET | Freq: Every day | ORAL | Status: DC

## 2015-12-05 MED ORDER — ALBUTEROL SULFATE (2.5 MG/3ML) 0.083% IN NEBU: 2.5000 mg | INHALATION_SOLUTION | Freq: Four times a day (QID) | RESPIRATORY_TRACT | Status: DC | PRN

## 2015-12-05 MED ORDER — VENLAFAXINE HCL ER 75 MG PO CP24: 150.0000 mg | ORAL_CAPSULE | Freq: Every day | ORAL | Status: DC

## 2015-12-05 NOTE — Telephone Encounter (Signed)
Pt request refill of the following: albuterol (PROVENTIL) (2.5 MG/3ML) 0.083% nebulizer solution 2.5 mg   [name] getting out of hosp today and need to have above rx    Phamacy: [name] [place]

## 2015-12-05 NOTE — Progress Notes (Signed)
Pt ready for discharge. Education/instructions reviewed with pt and all questions/cocnerns addressed. IV removed and belongings gathered. Pt will be transported out via wheelchair to friend's car. Will continue to monitor 

## 2015-12-05 NOTE — Discharge Summary (Signed)
Physician Discharge Summary  [name]  [id number]  DOB: 1961-09-13  DOA: 11/30/2015  PCP: Garret Reddish, MD  Admit date: 11/30/2015 Discharge date: 12/05/2015  Time spent: Greater than 30 minutes  Recommendations for Outpatient Follow-up:  1. Dr. Garret Reddish, PCP in 3 days. To be seen with repeat labs (CBC & BMP). 2. Darlina Sicilian, [facility] on 12/17/15 at 10 AM.  Discharge Diagnoses:  Active Problems:   COPD exacerbation (HCC)   SOB (shortness of breath)   Shortness of breath   Chronic diastolic heart failure (HCC)   Sarcoidosis (HCC)   Vocal cord dysfunction   Gastroesophageal reflux disease without esophagitis   Laryngospasm   Discharge Condition: Improved & Stable  Diet recommendation: Heart healthy and diabetic diet.  Filed Weights   12/01/15 0214  Weight: 112.356 kg (247 lb 11.2 oz)    History of present illness:  55 yo female with history of COPD, sarcoidosis, DM, HTN who came with cc of non productive cough for 3 days associated with dyspnea and mild wheezing. No chest pain. No fever/chills. No preceding URI symptoms but feels her throat is tight. No N/V/D/C/abd pain/dysuria/rash. No pruritus or hives or angioedema. She used albuterol neb once every night for the last 2 nights pta w/o much relief. Admitted for sarcoidosis flare, possibly precipitated by viral URTI. Pulmonology was consulted. Patient gradually improved.  Hospital Course:   Possible sarcoidosis flareup/vocal cord dysfunction/hyperactive airway disease flare up - Patient was treated with steroids (initially oral prednisone but switched to IV Solu-Medrol), bronchodilator nebulizations, oxygen, completed a 5 day course of oral azithromycin, Brovana and Pulmicort nebulizations. Flu antigen testing was negative. Patient is a lifelong nonsmoker. Despite treatment, when patient did not make adequate improvement, pulmonology was consulted. They felt that patient had a possible  sarcoidosis flare +/- VCD/hyperactive airway disease flare precipitated by atypical/viral infection. With above measures, over the last 48 hours, patient has steadily improved. Discussed with Dr. Titus Mould, patient can be discharged on her prior home pulmonary regimen and prednisone taper over 2 weeks. She has follow-up appointment with pulmonology. Following completion of prednisone taper, patient will resume her maintenance dose of prednisone 5 MG daily.  Essential hypertension - Patient was only treated with Cardizem in the hospital and blood pressures remained slightly elevated. Resume rest of her home antihypertensives including ARB and carvedilol.  Uncontrolled type II DM/IDDM - Precipitated by steroids. Hemoglobin A1c 8.4 on 10/14/15. Glycemic control should improve with tapering off of steroids. Continue home diabetic regimen which can be further adjusted during close outpatient follow-up.  Chronic diastolic CHF - Compensated  Anemia - Stable  GERD/possible vocal cord dysfunction - Continue  Depression - Patient on polypharmacy at home which she states that she has been on. This can be further evaluated and managed as outpatient as deemed necessary.  Consultations:  Pulmonology  Procedures:  None    Discharge Exam:  Complaints:  Continues to improve. Dyspnea continues to improve. Denies cough or chest pain. As per RN, no acute issues.  Filed Vitals:   12/04/15 2047 12/04/15 2110 12/05/15 0523 12/05/15 0855  BP:  153/65 173/67   Pulse:  93 60   Temp:  98.9 F (37.2 C) 98 F (36.7 C)   TempSrc:  Oral Oral   Resp:  18 18   Height:      Weight:      SpO2: 99% 99% 96% 99%    General exam: Pleasant middle-aged female, moderately built and overweight, sitting up comfortably in  bed. Respiratory system: Much improved and good breath sounds bilaterally. Occasional expiratory rhonchi anteriorly. No increased work of breathing. Cardiovascular system: S1 & S2 heard, RRR. No  JVD, murmurs, gallops, clicks or pedal edema. Gastrointestinal system: Abdomen is nondistended, soft and nontender. Normal bowel sounds heard. Central nervous system: Alert and oriented. No focal neurological deficits. Extremities: Symmetric 5 x 5 power.  Discharge Instructions      Discharge Instructions    (HEART FAILURE PATIENTS) Call MD:  Anytime you have any of the following symptoms: 1) 3 pound weight gain in 24 hours or 5 pounds in 1 week 2) shortness of breath, with or without a dry hacking cough 3) swelling in the hands, feet or stomach 4) if you have to sleep on extra pillows at night in order to breathe.    Complete by:  As directed      Call MD for:  difficulty breathing, headache or visual disturbances    Complete by:  As directed      Call MD for:  extreme fatigue    Complete by:  As directed      Call MD for:  persistant dizziness or light-headedness    Complete by:  As directed      Call MD for:  persistant nausea and vomiting    Complete by:  As directed      Call MD for:  severe uncontrolled pain    Complete by:  As directed      Call MD for:  temperature >100.4    Complete by:  As directed      Diet - low sodium heart healthy    Complete by:  As directed      Diet Carb Modified    Complete by:  As directed      Increase activity slowly    Complete by:  As directed             Medication List    STOP taking these medications        fluconazole 100 MG tablet  Commonly known as:  DIFLUCAN     hyoscyamine 0.125 MG SL tablet  Commonly known as:  LEVSIN/SL     promethazine 25 MG tablet  Commonly known as:  PHENERGAN      TAKE these medications        acetaminophen 325 MG tablet  Commonly known as:  TYLENOL  Take 650 mg by mouth every 6 (six) hours as needed for headache. Reported on 11/12/2015     albuterol 108 (90 Base) MCG/ACT inhaler  Commonly known as:  PROVENTIL HFA;VENTOLIN HFA  Inhale 1-2 puffs into the lungs every 6 (six) hours as needed for  wheezing or shortness of breath.     allopurinol 100 MG tablet  Commonly known as:  ZYLOPRIM  Take 100 mg by mouth at bedtime.     aspirin EC 81 MG tablet  Take 81 mg by mouth daily.     atorvastatin 40 MG tablet  Commonly known as:  LIPITOR  TAKE ONE TABLET BY MOUTH ONE TIME DAILY     Insulin Pen Needle 31G X 8 MM Misc  Use to inject insulin 4 times daily.     B-D ULTRAFINE III SHORT PEN 31G X 8 MM Misc  Generic drug:  Insulin Pen Needle  FOLLOW DIRECTIONS PROVIDED BY PHYSICIAN     benzonatate 100 MG capsule  Commonly known as:  TESSALON  Take 100 mg by mouth 3 (three) times daily as  needed for cough.     BRISDELLE 7.5 MG Caps  Generic drug:  PARoxetine Mesylate  Take 7.5 mg by mouth daily.     buPROPion 300 MG 24 hr tablet  Commonly known as:  WELLBUTRIN XL  Take 1 tablet (300 mg total) by mouth daily.     carvedilol 25 MG tablet  Commonly known as:  COREG  Take 1 tablet (25 mg total) by mouth 2 (two) times daily with a meal.     chlorpheniramine-HYDROcodone 10-8 MG/5ML Suer  Commonly known as:  TUSSIONEX  Take 5 mLs by mouth daily as needed for cough.     Cholecalciferol 5000 units Tabs  Take 1 tablet (5,000 Units total) by mouth daily.     DEXILANT 60 MG capsule  Generic drug:  dexlansoprazole  TAKE ONE CAPSULE BY MOUTH ONE TIME DAILY     diltiazem 120 MG 24 hr capsule  Commonly known as:  CARTIA XT  Take 1 capsule (120 mg total) by mouth daily.     estradiol 2 MG tablet  Commonly known as:  ESTRACE  Take 2 mg by mouth at bedtime.     fenofibrate 145 MG tablet  Commonly known as:  TRICOR  TAKE ONE TABLET BY MOUTH ONE TIME DAILY     fluticasone 50 MCG/ACT nasal spray  Commonly known as:  FLONASE  USE TWO SPRAYS IN EACH NOSTRIL DAILY     furosemide 40 MG tablet  Commonly known as:  LASIX  Take 1 tablet (40 mg total) by mouth 2 (two) times daily. PATIENT NEEDS TO CONTACT OFFICE FOR ADDITIONAL REFILLS     gabapentin 100 MG capsule  Commonly known  as:  NEURONTIN  Take 1 capsule (100 mg total) by mouth 3 (three) times daily. **PT NEEDS AN APPT FOR FURTHER REFILLS**     glucose blood test strip  Commonly known as:  ONE TOUCH ULTRA TEST  1 each by Other route 3 (three) times daily. Use as instructed     losartan 100 MG tablet  Commonly known as:  COZAAR  Take 1 tablet (100 mg total) by mouth daily.     metFORMIN 1000 MG tablet  Commonly known as:  GLUCOPHAGE  TAKE ONE TABLET TWICE DAILY WITH MEALS     mometasone-formoterol 200-5 MCG/ACT Aero  Commonly known as:  DULERA  Inhale 2 puffs into the lungs 2 (two) times daily.     nitroGLYCERIN 0.4 MG SL tablet  Commonly known as:  NITROSTAT  Place 0.4 mg under the tongue every 5 (five) minutes as needed. Reported on 11/12/2015     NOVOLOG FLEXPEN 100 UNIT/ML FlexPen  Generic drug:  insulin aspart  Inject 7-12 Units into the skin 3 (three) times daily with meals. Per sliding scale     ondansetron 4 MG disintegrating tablet  Commonly known as:  ZOFRAN-ODT  Take 4 mg by mouth every 8 (eight) hours as needed for nausea or vomiting. Reported on 11/12/2015     potassium chloride SA 20 MEQ tablet  Commonly known as:  K-DUR,KLOR-CON  Take 1 tablet (20 mEq total) by mouth 3 (three) times daily.     predniSONE 5 MG tablet  Commonly known as:  DELTASONE  Take 1 tablet (5 mg total) by mouth daily with breakfast. Do not take this while you are on prednisone taper prescribed from the hospital. Resume this after the prednisone taper is completed.     predniSONE 10 MG tablet  Commonly known as:  DELTASONE  Take 5 tablets (50 mg total) by mouth daily with breakfast. Taper down by 10 mg every 3 days to discontinue.  Start taking on:  12/06/2015     SYSTANE 0.4-0.3 % Soln  Generic drug:  Polyethyl Glycol-Propyl Glycol  Apply 1 drop to eye at bedtime.     terbinafine 250 MG tablet  Commonly known as:  LAMISIL  Take 1 tablet (250 mg total) by mouth daily.     TOUJEO SOLOSTAR 300 UNIT/ML Sopn   Generic drug:  Insulin Glargine  INJECT 40 UNITS INTO THE SKIN AT BEDTIME     venlafaxine XR 75 MG 24 hr capsule  Commonly known as:  EFFEXOR-XR  Take 2 capsules (150 mg total) by mouth at bedtime.     vitamin B-12 1000 MCG tablet  Commonly known as:  CYANOCOBALAMIN  Take 1,000 mcg by mouth daily.       Follow-up Information    Follow up with [facility], NP On 12/17/2015.   Specialty:  Pulmonary Disease   Why:  [facility] --- 10:00am    Contact information:   [place] [address] [phone]       Follow up with Garret Reddish, MD. Schedule an appointment as soon as possible for a visit in 3 days.   Specialty:  Family Medicine   Why:  Post hospital discharge follow up. May need adjustment of diabetic medications. To be seen with repeat labs (CBC & BMP).   Contact information:   [place] [address] [phone]       Get Medicines reviewed and adjusted: Please take all your medications with you for your next visit with your Primary MD  Please request your Primary MD to go over all hospital tests and procedure/radiological results at the follow up. Please ask your Primary MD to get all Hospital records sent to his/her office.  If you experience worsening of your admission symptoms, develop shortness of breath, life threatening emergency, suicidal or homicidal thoughts you must seek medical attention immediately by calling 911 or calling your MD immediately if symptoms less severe.  You must read complete instructions/literature along with all the possible adverse reactions/side effects for all the Medicines you take and that have been prescribed to you. Take any new Medicines after you have completely understood and accept all the possible adverse reactions/side effects.   Do not drive when taking pain medications.   Do not take more than prescribed Pain, Sleep and Anxiety Medications  Special Instructions: If you  have smoked or chewed Tobacco in the last 2 yrs please stop smoking, stop any regular Alcohol and or any Recreational drug use.  Wear Seat belts while driving.  Please note  You were cared for by a hospitalist during your hospital stay. Once you are discharged, your primary care physician will handle any further medical issues. Please note that NO REFILLS for any discharge medications will be authorized once you are discharged, as it is imperative that you return to your primary care physician (or establish a relationship with a primary care physician if you do not have one) for your aftercare needs so that they can reassess your need for medications and monitor your lab values.    The results of significant diagnostics from this hospitalization (including imaging, microbiology, ancillary and laboratory) are listed below for reference.    Significant Diagnostic Studies: Dg Neck Soft Tissue  11/30/2015  CLINICAL DATA:  Upper airway wheezing. EXAM: NECK SOFT TISSUES - 1+  VIEW COMPARISON:  None. FINDINGS: The valleculae and epiglottis are obscured. There is no evidence of retropharyngeal soft tissue swelling. The cervical airway is unremarkable. No radio-opaque foreign body identified. Patient is edentulous. IMPRESSION: Vallecular and epiglottis are obscured, significance uncertain. Neck CT, preferably with contrast could be considered for further evaluation. Electronically Signed   By: Jeb Levering M.D.   On: 11/30/2015 21:44   Dg Chest 2 View  11/30/2015  CLINICAL DATA:  Nonproductive cough for 4 days. Shortness breath with generalized chest pain. History of COPD/ asthma, hypertension and diabetes. EXAM: CHEST  2 VIEW COMPARISON:  05/07/2015 and 10/13/2015. FINDINGS: The heart size and mediastinal contours are stable. There are persistent low lung volumes with bibasilar atelectasis and vascular crowding. No edema, confluent airspace opacity or significant pleural effusion identified. The bones  appear unchanged. IMPRESSION: Stable chest with low lung volumes and bibasilar atelectasis. No acute findings. Electronically Signed   By: Richardean Sale M.D.   On: 11/30/2015 16:31   Ct Soft Tissue Neck W Contrast  12/01/2015  CLINICAL DATA:  Initial evaluation for upper airway irritation. EXAM: CT NECK WITH CONTRAST TECHNIQUE: Multidetector CT imaging of the neck was performed using the standard protocol following the bolus administration of intravenous contrast. CONTRAST:  72mL OMNIPAQUE IOHEXOL 300 MG/ML  SOLN COMPARISON:  None. FINDINGS: Partially visualized brain is unremarkable. Minimal mucosal thickening within the partially visualized maxillary sinuses, right greater than left. Partially visualized paranasal sinuses are otherwise clear. Partially visualized mastoid air cells are clear. Middle ear cavities are clear. Salivary glands including the parotid glands and submandibular glands are within normal limits. Patient is edentulous. Oral cavity within normal limits. Palatine tonsils unremarkable. Parapharyngeal fat preserved. Remainder of the oropharynx and nasopharynx within normal limits. Epiglottis normal. Vallecula clear. Minimal induration within the retropharyngeal fat, which may reflect very mild pharyngitis. No frank retropharyngeal fluid collection or abscess. Remainder of the hypopharynx and supraglottic larynx within normal limits. True cords unremarkable. Subglottic airway is clear. Thyroid gland within normal limits. No adenopathy within the neck. Partially visualized superior mediastinum demonstrates no acute abnormality. Partially visualized lungs are clear. Normal intravascular enhancement seen throughout the neck. Carotid arteries are medialized into the retropharyngeal space bilaterally. Mild atheromatous plaque about the carotid bifurcations bilaterally, slightly greater on the left. No acute osseous abnormality. No worrisome lytic or blastic osseous lesions. IMPRESSION: 1. Minimal  induration/ inflammatory stranding within the retropharyngeal fat, which may reflect very mild or early/developing acute pharyngitis. No abscess or drainable fluid collection. Airway is widely patent. 2. Otherwise negative CT of the neck. Electronically Signed   By: Jeannine Boga M.D.   On: 12/01/2015 00:18   Dg Chest Port 1 View  12/03/2015  CLINICAL DATA:  Shortness of breath. Chest tightness. Dry cough. Sarcoidosis. EXAM: PORTABLE CHEST 1 VIEW COMPARISON:  11/30/2015. FINDINGS: Stable enlarged cardiac silhouette. Mild central peribronchial thickening with progression. Clear lungs. No pleural fluid. Mild lower thoracic spine degenerative changes. IMPRESSION: Mild bronchitic changes. Electronically Signed   By: Claudie Revering M.D.   On: 12/03/2015 12:48    Microbiology: No results found for this or any previous visit (from the past 240 hour(s)).   Labs: Basic Metabolic Panel:  Recent Labs Lab 11/30/15 1535  NA 135  K 3.5  CL 98*  CO2 23  GLUCOSE 209*  BUN 12  CREATININE 0.96  CALCIUM 9.0   Liver Function Tests: No results for input(s): AST, ALT, ALKPHOS, BILITOT, PROT, ALBUMIN in the last 168 hours. No results for  input(s): LIPASE, AMYLASE in the last 168 hours. No results for input(s): AMMONIA in the last 168 hours. CBC:  Recent Labs Lab 11/30/15 1535 12/03/15 1234  WBC 9.8 14.2*  NEUTROABS  --  12.6*  HGB 9.8* 10.8*  HCT 30.6* 33.3*  MCV 87.7 87.2  PLT 293 394   Cardiac Enzymes: No results for input(s): CKTOTAL, CKMB, CKMBINDEX, TROPONINI in the last 168 hours. BNP: BNP (last 3 results)  Recent Labs  10/14/15 0049  BNP 39.7    ProBNP (last 3 results) No results for input(s): PROBNP in the last 8760 hours.  CBG:  Recent Labs Lab 12/04/15 1126 12/04/15 1650 12/04/15 2201 12/05/15 0626 12/05/15 1124  GLUCAP 272* 317* 365* 322* 300*       Signed:  Vernell Leep, MD, FACP, FHM. [facility] Pager [phone]  If 7PM-7AM, please  contact night-coverage [email or web address] Password [facility] 12/05/2015, 12:31 PM

## 2015-12-05 NOTE — Telephone Encounter (Signed)
Medication sent in, above message noted.

## 2015-12-05 NOTE — Discharge Instructions (Signed)
Sarcoidosis  Sarcoidosis is a disease that causes inflammation in your organs and other areas of your body. The lungs are most often affected (pulmonary sarcoidosis). Sarcoidosis can also affect your lymph nodes, liver, eyes, skin, or any other body tissue.  When you have sarcoidosis, small clumps of tissue (granulomas) form in the affected area of your body. Granulomas are made up of your body's defense (immune) cells. Inflammation results when your body reacts to a harmful substance. Normally, inflammation goes away after immune cells get rid of the harmful substance. In sarcoidosis, the immune cells form granulomas instead.  CAUSES   The exact cause of sarcoidosis is not known. Something triggers the immune system to respond, such as dust, chemicals, bacteria, or a virus.   RISK FACTORS  You may be at a greater risk for sarcoidosis if you:   · Have a family history of the disease.  · Are African American.  · Are of Northern European ancestry.  · Are 20-50 years old.  · Are female.  SIGNS AND SYMPTOMS   Many people with sarcoidosis have no symptoms. Others have very mild symptoms. Sarcoidosis most often affects the lungs. Symptoms include:  · Chest pain.  · Coughing.  · Wheezing.  · Shortness of breath.  Other common symptoms include:   · Night sweats.  · Weight loss.  · Fatigue.  · Depression.  · A sense of uneasiness.  DIAGNOSIS   Sarcoidosis may be diagnosed by:   · Medical history and physical exam.  · Chest X-ray. This looks for granulomas in your lungs.  · Lung function tests. These measure your breathing and look for problems related to sarcoidosis.  · Examining a sample of tissue under a microscope (biopsy).  TREATMENT   Sarcoidosis usually clears up without treatment. You may take medicines to reduce inflammation or relieve symptoms. These may include:  · Prednisone. This steroid reduces inflammation related to sarcoidosis.  · Chloroquine or hydroxychloroquine. These are antimalarial medicines used to  treat sarcoidosis that affects the skin or brain.  · Methotrexate, leflunomide, or azathioprine. These medicines affect the immune system and can help with sarcoidosis in the joints, eyes, skin, or lungs.  · Inhalers. Inhaled medicines can help you breathe if sarcoidosis is affecting your lungs.  HOME CARE INSTRUCTIONS  · Do not use any tobacco products, including cigarettes, chewing tobacco, or electronic cigarettes. If you need help quitting, ask your health care provider.  · Avoid secondhand smoke.  · Avoid irritating dust and chemicals. Stay indoors on days when air quality is poor in your area.  · Take medicines only as directed by your health care provider.  SEEK MEDICAL CARE IF:  · You have vision problems.  · You have shortness of breath.  · You have a dry, persistent cough.  · You have an irregular heartbeat.  · You have pain or ache in your joints, hands, or feet.  · You have an unexplained rash.  SEEK IMMEDIATE MEDICAL CARE IF:   You have chest pain.     This information is not intended to replace advice given to you by your health care provider. Make sure you discuss any questions you have with your health care provider.     Document Released: 07/28/2004 Document Revised: 10/18/2014 Document Reviewed: 01/23/2014  Elsevier Interactive Patient Education ©2016 [employer]

## 2015-12-05 NOTE — Telephone Encounter (Signed)
Azucena Freed from [place] called stated patient in the hospital she's in the case management program.(FYI) if any questions call [phone]

## 2015-12-06 ENCOUNTER — Other Ambulatory Visit: Payer: Self-pay | Admitting: Internal Medicine

## 2015-12-08 NOTE — Telephone Encounter (Signed)
Received refill request electronically Last refill 07/21/15 #90/1 Has upcoming appointment scheduled 01/07/16 Is it okay to refill?

## 2015-12-09 ENCOUNTER — Encounter: Payer: Self-pay | Admitting: Family Medicine

## 2015-12-09 ENCOUNTER — Ambulatory Visit (INDEPENDENT_AMBULATORY_CARE_PROVIDER_SITE_OTHER): Payer: BLUE CROSS/BLUE SHIELD | Admitting: Family Medicine

## 2015-12-09 VITALS — BP 132/60 | HR 80 | Temp 98.6°F | Wt 246.0 lb

## 2015-12-09 DIAGNOSIS — IMO0002 Reserved for concepts with insufficient information to code with codable children: Secondary | ICD-10-CM

## 2015-12-09 DIAGNOSIS — D860 Sarcoidosis of lung: Secondary | ICD-10-CM

## 2015-12-09 DIAGNOSIS — E1141 Type 2 diabetes mellitus with diabetic mononeuropathy: Secondary | ICD-10-CM | POA: Diagnosis not present

## 2015-12-09 DIAGNOSIS — I10 Essential (primary) hypertension: Secondary | ICD-10-CM | POA: Diagnosis not present

## 2015-12-09 DIAGNOSIS — E1165 Type 2 diabetes mellitus with hyperglycemia: Secondary | ICD-10-CM

## 2015-12-09 LAB — CBC
HCT: 36.6 % (ref 36.0–46.0)
Hemoglobin: 12.1 g/dL (ref 12.0–15.0)
MCHC: 33.2 g/dL (ref 30.0–36.0)
MCV: 86.3 fl (ref 78.0–100.0)
Platelets: 490 10*3/uL — ABNORMAL HIGH (ref 150.0–400.0)
RBC: 4.24 Mil/uL (ref 3.87–5.11)
RDW: 14.9 % (ref 11.5–15.5)
WBC: 20.4 10*3/uL (ref 4.0–10.5)

## 2015-12-09 LAB — BASIC METABOLIC PANEL
BUN: 34 mg/dL — ABNORMAL HIGH (ref 6–23)
CO2: 29 mEq/L (ref 19–32)
Calcium: 9.7 mg/dL (ref 8.4–10.5)
Chloride: 94 mEq/L — ABNORMAL LOW (ref 96–112)
Creatinine, Ser: 1.16 mg/dL (ref 0.40–1.20)
GFR: 51.3 mL/min — ABNORMAL LOW (ref >60.00)
Glucose, Bld: 338 mg/dL — ABNORMAL HIGH (ref 70–99)
Potassium: 4.6 mEq/L (ref 3.5–5.1)
Sodium: 136 mEq/L (ref 135–145)

## 2015-12-09 NOTE — Patient Instructions (Signed)
Glad you are improving even though not quite there yet. Continue your prednisone taper and follow up with pulmonology.   Labs before you leave  Hopefully sugar continues to come down on less steroid- if you need help reach out to [place] or Dr. Cruzita Lederer

## 2015-12-09 NOTE — Assessment & Plan Note (Signed)
S: drastic elevations in hospital. At home has been slowly improving. This mornign down to 143 fasting as prednisone tapers down. She has taken slightly more than usual as a result due to sliding scale on mealtime insulin A/P: encouraged to continue sliding scale and inform either me or endocrine if not continuing to improve. i suspect it will be very difficult to manage her with intermittent prednisone she has to take for sarcoidosis

## 2015-12-09 NOTE — Assessment & Plan Note (Signed)
S: controlled on repeat on Cartia XT 120mg , carvedilol 25 mg BID, losartan50mg  BP Readings from Last 3 Encounters:  12/09/15 132/60  12/05/15 173/67  11/24/15 118/58  A/P:Continue current medications

## 2015-12-09 NOTE — Assessment & Plan Note (Signed)
S: recent hospitalization. She is recovering though not as quickly as I would desire with albuterol inhaler use twice a day and nebulizer at night. Compliant with dulera and her prednisone taper A/P: does seem to have WBC increase likely from demargination. Concern is if infection could still be at play though and that is why not having more rapid improvement. Luckily she is improving- given strict instructions to return to pulmonology or me sooner than march visit if not continuing to improve.

## 2015-12-09 NOTE — Progress Notes (Signed)
Garret Reddish, MD  Subjective:  [name] is a 55 y.o. year old very pleasant female patient who presents for/with See problem oriented charting ROS- no fever, chills, nausea, vomiting. Continued shortness of breath improving each day  Past Medical History-  Patient Active Problem List   Diagnosis Date Noted  . Obstructive chronic bronchitis without exacerbation ([place]) 09/18/2013    Priority: High  . Chest pain - at rest, and with exertion; -- angiographically normal coronary arteries, mildly elevated LVEDP 04/11/2013    Priority: High  . Chronic diastolic heart failure, NYHA class 2 ([place])     Priority: High  . Sarcoidosis of lung ([place]) 04/10/2007    Priority: High  . Type II diabetes mellitus with neurological manifestations, uncontrolled ([place]) 08/21/2006    Priority: High  . Fatty liver 09/30/2014    Priority: Medium  . Depression     Priority: Medium  . Gout 08/20/2010    Priority: Medium  . Anemia 09/18/2009    Priority: Medium  . Sleep apnea 04/21/2009    Priority: Medium  . Hyperlipidemia 08/21/2006    Priority: Medium  . Essential hypertension 08/21/2006    Priority: Medium  . Onychomycosis 10/27/2015    Priority: Low  . Hot flashes 07/15/2014    Priority: Low  . Abnormal SPEP 04/17/2014    Priority: Low  . Fracture of left leg 04/17/2014    Priority: Low  . Cushingoid side effect of steroids ([place]) 04/17/2014    Priority: Low  . Internal hemorrhoids     Priority: Low  . Preoperative clearance 03/25/2014    Priority: Low  . Other and unspecified coagulation defects 04/22/2013    Priority: Low  . Solitary pulmonary nodule, on CT 02/2013 - stable over 2 years in 2015 02/20/2013    Priority: Low  . Obesity 06/04/2009    Priority: Low  . GERD 08/21/2006    Priority: Low  . Osteomyelitis of left foot ([place]) 05/29/2015  . MRSA (methicillin resistant staph aureus) culture positive 03/27/2015  . Wound infection complicating hardware ([place]) 03/27/2015     Medications- reviewed and updated Current Outpatient Prescriptions  Medication Sig Dispense Refill  . allopurinol (ZYLOPRIM) 100 MG tablet Take 100 mg by mouth at bedtime.     [name] aspirin EC 81 MG tablet Take 81 mg by mouth daily.     [name] atorvastatin (LIPITOR) 40 MG tablet TAKE ONE TABLET BY MOUTH ONE TIME DAILY 90 tablet 3  . B-D ULTRAFINE III SHORT PEN 31G X 8 MM MISC FOLLOW DIRECTIONS PROVIDED BY PHYSICIAN 130 each 2  . buPROPion (WELLBUTRIN XL) 300 MG 24 hr tablet Take 1 tablet (300 mg total) by mouth daily. 90 tablet 3  . carvedilol (COREG) 25 MG tablet Take 1 tablet (25 mg total) by mouth 2 (two) times daily with a meal. 180 tablet 3  . cholecalciferol 5000 UNITS TABS Take 1 tablet (5,000 Units total) by mouth daily. (Patient taking differently: Take 5,000 Units by mouth every morning. ) 30 tablet 3  . DEXILANT 60 MG capsule TAKE ONE CAPSULE BY MOUTH ONE TIME DAILY 30 capsule 6  . diltiazem (CARTIA XT) 120 MG 24 hr capsule Take 1 capsule (120 mg total) by mouth daily. 30 capsule 1  . estradiol (ESTRACE) 2 MG tablet Take 2 mg by mouth at bedtime.     . fenofibrate (TRICOR) 145 MG tablet TAKE ONE TABLET BY MOUTH ONE TIME DAILY  (Patient taking differently: TAKE ONE TABLET BY MOUTH every evening)  30 tablet 10  . fluticasone (FLONASE) 50 MCG/ACT nasal spray USE TWO SPRAYS IN EACH NOSTRIL DAILY 16 g 4  . furosemide (LASIX) 40 MG tablet Take 1 tablet (40 mg total) by mouth 2 (two) times daily. PATIENT NEEDS TO CONTACT OFFICE FOR ADDITIONAL REFILLS 60 tablet 0  . gabapentin (NEURONTIN) 100 MG capsule TAKE ONE CAPSULE BY MOUTH 3 TIMES A DAY (PATIENT NEEDS APPOINTMENT FOR FURTHER REFILLS) 90 capsule 0  . glucose blood (ONE TOUCH ULTRA TEST) test strip 1 each by Other route 3 (three) times daily. Use as instructed 100 each 5  . insulin aspart (NOVOLOG FLEXPEN) 100 UNIT/ML FlexPen Inject 7-12 Units into the skin 3 (three) times daily with meals. Per sliding scale    . Insulin Pen Needle 31G X 8 MM  MISC Use to inject insulin 4 times daily. 130 each 2  . losartan (COZAAR) 100 MG tablet Take 1 tablet (100 mg total) by mouth daily. 90 tablet 3  . metFORMIN (GLUCOPHAGE) 1000 MG tablet TAKE ONE TABLET TWICE DAILY WITH MEALS 180 tablet 0  . mometasone-formoterol (DULERA) 200-5 MCG/ACT AERO Inhale 2 puffs into the lungs 2 (two) times daily. 13 g 5  . PARoxetine Mesylate (BRISDELLE) 7.5 MG CAPS Take 7.5 mg by mouth daily.    Vladimir Faster Glycol-Propyl Glycol (SYSTANE) 0.4-0.3 % SOLN Apply 1 drop to eye at bedtime.    . potassium chloride SA (K-DUR,KLOR-CON) 20 MEQ tablet Take 1 tablet (20 mEq total) by mouth 3 (three) times daily. 90 tablet 1  . predniSONE (DELTASONE) 10 MG tablet Take 5 tablets (50 mg total) by mouth daily with breakfast. Taper down by 10 mg every 3 days to discontinue. 45 tablet 0  . terbinafine (LAMISIL) 250 MG tablet Take 1 tablet (250 mg total) by mouth daily. 30 tablet 3  . TOUJEO SOLOSTAR 300 UNIT/ML SOPN INJECT 40 UNITS INTO THE SKIN AT BEDTIME 13.5 mL 1  . venlafaxine XR (EFFEXOR-XR) 75 MG 24 hr capsule Take 2 capsules (150 mg total) by mouth at bedtime.    . vitamin B-12 (CYANOCOBALAMIN) 1000 MCG tablet Take 1,000 mcg by mouth daily.    [name] acetaminophen (TYLENOL) 325 MG tablet Take 650 mg by mouth every 6 (six) hours as needed for headache. Reported on 12/09/2015    . albuterol (PROVENTIL HFA;VENTOLIN HFA) 108 (90 BASE) MCG/ACT inhaler Inhale 1-2 puffs into the lungs every 6 (six) hours as needed for wheezing or shortness of breath. (Patient not taking: Reported on 12/09/2015) 8 g 5  . albuterol (PROVENTIL) (2.5 MG/3ML) 0.083% nebulizer solution Take 3 mLs (2.5 mg total) by nebulization every 6 (six) hours as needed for wheezing or shortness of breath. (Patient not taking: Reported on 12/09/2015) 150 mL 6  . benzonatate (TESSALON) 100 MG capsule Take 100 mg by mouth 3 (three) times daily as needed for cough. Reported on 12/09/2015    . chlorpheniramine-HYDROcodone (TUSSIONEX) 10-8  MG/5ML SUER Take 5 mLs by mouth daily as needed for cough. Reported on 12/09/2015  0  . nitroGLYCERIN (NITROSTAT) 0.4 MG SL tablet Place 0.4 mg under the tongue every 5 (five) minutes as needed. Reported on 12/09/2015    . ondansetron (ZOFRAN-ODT) 4 MG disintegrating tablet Take 4 mg by mouth every 8 (eight) hours as needed for nausea or vomiting. Reported on 12/09/2015  0  . predniSONE (DELTASONE) 5 MG tablet Take 1 tablet (5 mg total) by mouth daily with breakfast. Do not take this while you are on prednisone taper prescribed from  the hospital. Resume this after the prednisone taper is completed. (Patient not taking: Reported on 12/09/2015)     No current facility-administered medications for this visit.    Objective: BP 132/60 mmHg  Pulse 80  Temp(Src) 98.6 F (37 C)  Wt 246 lb (111.585 kg) Gen: NAD, resting comfortably CV: RRR no murmurs rubs or gallops Lungs: CTAB no crackles, wheeze, rhonchi Abdomen: soft/nontender/nondistended/normal bowel sounds. No rebound or guarding.  Ext: no edema Skin: warm, dry Neuro: grossly normal, moves all extremities   Assessment/Plan:  Sarcoidosis of lung ([place]) S: recent hospitalization. She is recovering though not as quickly as I would desire with albuterol inhaler use twice a day and nebulizer at night. Compliant with dulera and her prednisone taper A/P: does seem to have WBC increase likely from demargination. Concern is if infection could still be at play though and that is why not having more rapid improvement. Luckily she is improving- given strict instructions to return to pulmonology or me sooner than march visit if not continuing to improve.    Essential hypertension S: controlled on repeat on Cartia XT 120mg , carvedilol 25 mg BID, losartan50mg  BP Readings from Last 3 Encounters:  12/09/15 132/60  12/05/15 173/67  11/24/15 118/58  A/P:Continue current medications   Type II diabetes mellitus with neurological manifestations,  uncontrolled ([place]) S: drastic elevations in hospital. At home has been slowly improving. This mornign down to 143 fasting as prednisone tapers down. She has taken slightly more than usual as a result due to sliding scale on mealtime insulin A/P: encouraged to continue sliding scale and inform either me or endocrine if not continuing to improve. i suspect it will be very difficult to manage her with intermittent prednisone she has to take for sarcoidosis   3-4 months verbal. Return precautions advised.   Orders Placed This Encounter  Procedures  . Basic Metabolic Panel  . CBC (no diff)

## 2015-12-11 ENCOUNTER — Other Ambulatory Visit: Payer: Self-pay | Admitting: *Deleted

## 2015-12-11 MED ORDER — FENOFIBRATE 145 MG PO TABS: 145.0000 mg | ORAL_TABLET | Freq: Every day | ORAL | Status: DC

## 2015-12-12 ENCOUNTER — Telehealth: Payer: Self-pay | Admitting: Family Medicine

## 2015-12-12 NOTE — Telephone Encounter (Signed)
Labs have been sent through mychart.

## 2015-12-12 NOTE — Telephone Encounter (Signed)
Pt would like results of labs. Please call back. °

## 2015-12-17 ENCOUNTER — Inpatient Hospital Stay: Payer: BLUE CROSS/BLUE SHIELD | Admitting: Adult Health

## 2015-12-18 DIAGNOSIS — N390 Urinary tract infection, site not specified: Secondary | ICD-10-CM | POA: Diagnosis not present

## 2015-12-19 ENCOUNTER — Ambulatory Visit (INDEPENDENT_AMBULATORY_CARE_PROVIDER_SITE_OTHER): Payer: BLUE CROSS/BLUE SHIELD | Admitting: Adult Health

## 2015-12-19 ENCOUNTER — Encounter: Payer: Self-pay | Admitting: Adult Health

## 2015-12-19 VITALS — BP 130/68 | HR 82 | Temp 97.5°F | Ht 66.0 in | Wt 251.0 lb

## 2015-12-19 DIAGNOSIS — D860 Sarcoidosis of lung: Secondary | ICD-10-CM | POA: Diagnosis not present

## 2015-12-19 DIAGNOSIS — K219 Gastro-esophageal reflux disease without esophagitis: Secondary | ICD-10-CM

## 2015-12-19 NOTE — Addendum Note (Signed)
Addended by: Osa Craver on: 12/19/2015 10:58 AM   Modules accepted: Orders

## 2015-12-19 NOTE — Progress Notes (Signed)
Subjective:    Patient ID: [name], female    DOB: [birth year], 55 y.o.   MRN: [id number]  HPI  55 yo female never smoker with Sarcoidosis with mediastinoscopy in 2008.  Followed by Dr. Casper Harrison in [facility] with regular biannual follow-ups.  On chronic doses of prednisone 5mg  daily   TEST  lung function tests and imaging show moderate restrictive defects with mild reduction in DLCO.  The CT scan does not show any obvious pulmonary involvement with her sarcoidosis. Her sarcoidosis is believed to have an airway component as well since she has recurrent attacks of bronchitis. She is also diagnosed with vocal cord dysfunction by Dr. Joya Gaskins several years ago.  DATA: CT chest (07/03/13) No CT findings to suggest sarcoidosis. A single tiny punctate indeterminate nodule is noted. The nodule's oval configuration makes an intrapulmonary lymph node or other benign entity more likely. If patient has no risk factors for lung cancer, no  further followup would be warranted.  Chest x-ray 05/07/15 No acute cardiopulmonary disease.  PFTs 10/22/14 FVC 2.12 (57%) FEV1 1.57 (54%) F/F 74 DLCO 74.5%  03/25/14  FVC 2.09 [60%] FEV1 1.63 (59%] F/F 78  Surgical pathology, mediastinal lymph nodes 11/07/06 Nonnecrotizing granulomas.   12/19/2015 [facility] follow up  Pt presents for a post hospital follow up .  She was recently admitted 2/19 for Sarcoid flare with VCD and RAD  Tx w/ steroids, abx and nebs. She was discharged on steroid taper to taper to 5mg  daily  She is feeling better with less cough . No fever, chest pain, orthopnea, or edema.    She has been admitted x 2 over last 3 months with Sarcoid /bronchitis exacerbation.  She denies changes to her med regimen. No changes or stress at home.  Does have sinus drainage and breakthrough reflux intermittently.   Follows with Dr. Casper Harrison at [facility]. , next ov in Nov 2017.  On high dose coreg with cardiology.   Past Medical History    Diagnosis Date  . DIABETES MELLITUS, TYPE II 08/21/2006  . HYPERLIPIDEMIA 08/21/2006  . GOUT 08/20/2010  . OBESITY 06/04/2009  . ANEMIA-UNSPECIFIED 09/18/2009  . HYPERTENSION 08/21/2006  . GERD 08/21/2006  . Internal hemorrhoids   . Pulmonary sarcoidosis ([place])     Followed locally by pulmonology, but also by Dr. Casper Harrison at [facility]  . Exertional chest pain     sharp, substernal, exertional  . Vocal cord dysfunction   . Chronic diastolic heart failure, NYHA class 2 ([facility])     LVEDP roughly 20% by cath  . CHF (congestive heart failure) ([place])   . COPD (chronic obstructive pulmonary disease) ([place])   . Depression   . Fracture of 5th metatarsal     non union  . Abnormal SPEP 04/17/2014  . Osteomyelitis of left foot ([place]) 05/29/2015  . Diabetic osteomyelitis ([place]) 05/29/2015  . Hx of umbilical hernia repair   . Infection of wound due to methicillin resistant Staphylococcus aureus (MRSA)   . Wears partial dentures   . Onychomycosis 10/27/2015   . Current Outpatient Prescriptions on File Prior to Visit  Medication Sig Dispense Refill  . acetaminophen (TYLENOL) 325 MG tablet Take 650 mg by mouth every 6 (six) hours as needed for headache. Reported on 12/09/2015    . albuterol (PROVENTIL HFA;VENTOLIN HFA) 108 (90 BASE) MCG/ACT inhaler Inhale 1-2 puffs into the lungs every 6 (six) hours as needed for wheezing or shortness of breath. 8 g 5  . albuterol (  PROVENTIL) (2.5 MG/3ML) 0.083% nebulizer solution Take 3 mLs (2.5 mg total) by nebulization every 6 (six) hours as needed for wheezing or shortness of breath. 150 mL 6  . allopurinol (ZYLOPRIM) 100 MG tablet Take 100 mg by mouth at bedtime.     [name] aspirin EC 81 MG tablet Take 81 mg by mouth daily.     [name] atorvastatin (LIPITOR) 40 MG tablet TAKE ONE TABLET BY MOUTH ONE TIME DAILY 90 tablet 3  . B-D ULTRAFINE III SHORT PEN 31G X 8 MM MISC FOLLOW DIRECTIONS PROVIDED BY PHYSICIAN 130 each 2  . benzonatate (TESSALON) 100 MG capsule Take 100  mg by mouth 3 (three) times daily as needed for cough. Reported on 12/09/2015    . buPROPion (WELLBUTRIN XL) 300 MG 24 hr tablet Take 1 tablet (300 mg total) by mouth daily. 90 tablet 3  . carvedilol (COREG) 25 MG tablet Take 1 tablet (25 mg total) by mouth 2 (two) times daily with a meal. 180 tablet 3  . chlorpheniramine-HYDROcodone (TUSSIONEX) 10-8 MG/5ML SUER Take 5 mLs by mouth daily as needed for cough. Reported on 12/09/2015  0  . cholecalciferol 5000 UNITS TABS Take 1 tablet (5,000 Units total) by mouth daily. (Patient taking differently: Take 5,000 Units by mouth every morning. ) 30 tablet 3  . DEXILANT 60 MG capsule TAKE ONE CAPSULE BY MOUTH ONE TIME DAILY 30 capsule 6  . diltiazem (CARTIA XT) 120 MG 24 hr capsule Take 1 capsule (120 mg total) by mouth daily. 30 capsule 1  . estradiol (ESTRACE) 2 MG tablet Take 2 mg by mouth at bedtime.     . fenofibrate (TRICOR) 145 MG tablet Take 1 tablet (145 mg total) by mouth daily. 30 tablet 10  . fluticasone (FLONASE) 50 MCG/ACT nasal spray USE TWO SPRAYS IN EACH NOSTRIL DAILY 16 g 4  . furosemide (LASIX) 40 MG tablet Take 1 tablet (40 mg total) by mouth 2 (two) times daily. PATIENT NEEDS TO CONTACT OFFICE FOR ADDITIONAL REFILLS 60 tablet 0  . gabapentin (NEURONTIN) 100 MG capsule TAKE ONE CAPSULE BY MOUTH 3 TIMES A DAY (PATIENT NEEDS APPOINTMENT FOR FURTHER REFILLS) 90 capsule 0  . glucose blood (ONE TOUCH ULTRA TEST) test strip 1 each by Other route 3 (three) times daily. Use as instructed 100 each 5  . insulin aspart (NOVOLOG FLEXPEN) 100 UNIT/ML FlexPen Inject 7-12 Units into the skin 3 (three) times daily with meals. Per sliding scale    . Insulin Pen Needle 31G X 8 MM MISC Use to inject insulin 4 times daily. 130 each 2  . losartan (COZAAR) 100 MG tablet Take 1 tablet (100 mg total) by mouth daily. 90 tablet 3  . metFORMIN (GLUCOPHAGE) 1000 MG tablet TAKE ONE TABLET TWICE DAILY WITH MEALS 180 tablet 0  . mometasone-formoterol (DULERA) 200-5  MCG/ACT AERO Inhale 2 puffs into the lungs 2 (two) times daily. 13 g 5  . nitroGLYCERIN (NITROSTAT) 0.4 MG SL tablet Place 0.4 mg under the tongue every 5 (five) minutes as needed. Reported on 12/09/2015    . ondansetron (ZOFRAN-ODT) 4 MG disintegrating tablet Take 4 mg by mouth every 8 (eight) hours as needed for nausea or vomiting. Reported on 12/09/2015  0  . PARoxetine Mesylate (BRISDELLE) 7.5 MG CAPS Take 7.5 mg by mouth daily.    Vladimir Faster Glycol-Propyl Glycol (SYSTANE) 0.4-0.3 % SOLN Apply 1 drop to eye at bedtime.    . potassium chloride SA (K-DUR,KLOR-CON) 20 MEQ tablet Take 1 tablet (20  mEq total) by mouth 3 (three) times daily. 90 tablet 1  . predniSONE (DELTASONE) 10 MG tablet Take 5 tablets (50 mg total) by mouth daily with breakfast. Taper down by 10 mg every 3 days to discontinue. 45 tablet 0  . predniSONE (DELTASONE) 5 MG tablet Take 1 tablet (5 mg total) by mouth daily with breakfast. Do not take this while you are on prednisone taper prescribed from the hospital. Resume this after the prednisone taper is completed.    . terbinafine (LAMISIL) 250 MG tablet Take 1 tablet (250 mg total) by mouth daily. 30 tablet 3  . TOUJEO SOLOSTAR 300 UNIT/ML SOPN INJECT 40 UNITS INTO THE SKIN AT BEDTIME 13.5 mL 1  . venlafaxine XR (EFFEXOR-XR) 75 MG 24 hr capsule Take 2 capsules (150 mg total) by mouth at bedtime.    . vitamin B-12 (CYANOCOBALAMIN) 1000 MCG tablet Take 1,000 mcg by mouth daily.     No current facility-administered medications on file prior to visit.      Review of Systems Constitutional:   No  weight loss, night sweats,  Fevers, chills, fatigue, or  lassitude.  HEENT:   No headaches,  Difficulty swallowing,  Tooth/dental problems, or  Sore throat,                No sneezing, itching, ear ache,  +nasal congestion, post nasal drip,   CV:  No chest pain,  Orthopnea, PND, swelling in lower extremities, anasarca, dizziness, palpitations, syncope.   GI  No  abdominal pain,  nausea, vomiting, diarrhea, change in bowel habits, loss of appetite, bloody stools.   Resp:    No chest wall deformity  Skin: no rash or lesions.  GU: no dysuria, change in color of urine, no urgency or frequency.  No flank pain, no hematuria   MS:  No joint pain or swelling.  No decreased range of motion.  No back pain.  Psych:  No change in mood or affect. No depression or anxiety.  No memory loss.         Objective:   Physical Exam  Filed Vitals:   12/19/15 0951  BP: 130/68  Pulse: 82  Temp: 97.5 F (36.4 C)  TempSrc: Oral  Height: 5\' 6"  (1.676 m)  Weight: 251 lb (113.853 kg)  SpO2: 95%   GEN: A/Ox3; pleasant , NAD, morbidly obese   HEENT:  [place]/AT,  EACs-clear, TMs-wnl, NOSE-clear, THROAT-clear, no lesions, no postnasal drip or exudate noted. Class 2-3 MP airway   NECK:  Supple w/ fair ROM; no JVD; normal carotid impulses w/o bruits; no thyromegaly or nodules palpated; no lymphadenopathy.  RESP  Clear  P & A; w/o, wheezes/ rales/ or rhonchi.no accessory muscle use, no dullness to percussion  CARD:  RRR, no m/r/g  , no peripheral edema, pulses intact, no cyanosis or clubbing.  GI:   Soft & nt; nml bowel sounds; no organomegaly or masses detected.  Musco: Warm bil, no deformities or joint swelling noted.   Neuro: alert, no focal deficits noted.    Skin: Warm, no lesions or rashes   Noga Fogg NP-C  [facility]  12/19/2015        Assessment & Plan:

## 2015-12-19 NOTE — Patient Instructions (Addendum)
Continue on Dulera 2 puffs Twice daily  , rinse after use.  Add Zyrtec 10mg  At bedtime  .  Taper prednisone as directed to 5mg   And hold at this dose.  Delsym 2 tsp Twice daily  As needed cough .  GERD diet  Add Pepcid 20mg  At bedtime   Continue on Dexilant daily before meal .  follow up Dr. Vaughan Browner in 6-8 weeks with PFT  Please contact office for sooner follow up if symptoms do not improve or worsen or seek emergency care

## 2015-12-19 NOTE — Assessment & Plan Note (Addendum)
Recurrent flare with bronchitis ?VCD /upper airway cough  [name] on high dose Coreg which could be contributing , may consider changing to more selective BB if continues to flare.  For now add trigger control for rhintis and GERD management.  Check PFT on return   Plan  Continue on Dulera 2 puffs Twice daily  , rinse after use.  Add Zyrtec 10mg  At bedtime  .  Taper prednisone as directed to 5mg   And hold at this dose.  Delsym 2 tsp Twice daily  As needed cough .  GERD diet  Add Pepcid 20mg  At bedtime   Continue on Dexilant daily before meal .  follow up Dr. Vaughan Browner in 6-8 weeks with PFT  Please contact office for sooner follow up if symptoms do not improve or worsen or seek emergency care

## 2015-12-19 NOTE — Assessment & Plan Note (Signed)
  GERD diet  Add Pepcid 20mg  At bedtime   Continue on Dexilant daily before meal .  follow up Dr. Vaughan Browner in 6-8 weeks with PFT  Please contact office for sooner follow up if symptoms do not improve or worsen or seek emergency care

## 2016-01-04 ENCOUNTER — Other Ambulatory Visit: Payer: Self-pay | Admitting: Internal Medicine

## 2016-01-04 ENCOUNTER — Other Ambulatory Visit: Payer: Self-pay | Admitting: Family Medicine

## 2016-01-05 ENCOUNTER — Ambulatory Visit (INDEPENDENT_AMBULATORY_CARE_PROVIDER_SITE_OTHER): Payer: BLUE CROSS/BLUE SHIELD | Admitting: Family Medicine

## 2016-01-05 ENCOUNTER — Encounter: Payer: Self-pay | Admitting: Family Medicine

## 2016-01-05 VITALS — BP 150/68 | HR 96 | Temp 98.6°F | Wt 263.0 lb

## 2016-01-05 DIAGNOSIS — J0100 Acute maxillary sinusitis, unspecified: Secondary | ICD-10-CM | POA: Diagnosis not present

## 2016-01-05 DIAGNOSIS — R05 Cough: Secondary | ICD-10-CM | POA: Diagnosis not present

## 2016-01-05 DIAGNOSIS — R059 Cough, unspecified: Secondary | ICD-10-CM

## 2016-01-05 LAB — POCT INFLUENZA A: Rapid Influenza A Ag: NEGATIVE

## 2016-01-05 MED ORDER — AMOXICILLIN-POT CLAVULANATE 875-125 MG PO TABS: 1.0000 | ORAL_TABLET | Freq: Two times a day (BID) | ORAL | Status: DC

## 2016-01-05 NOTE — Progress Notes (Signed)
PCP: Garret Reddish, MD  Subjective:  [name] is a 55 y.o. year old very pleasant female patient who presents with sinusitis symptoms including nasal congestion, sinus tenderness -other symptoms include: nonproductive cough, nasal congestion thick green -day of illness:2 -started: sunday -Symptoms are worsening -previous treatments: none -sick contacts/travel/risks: positive flu exposure on friday -Hx of: allergies   ROS-denies fever, SOB, NVD.   Pertinent Past Medical History-  Patient Active Problem List   Diagnosis Date Noted  . Obstructive chronic bronchitis without exacerbation ([place]) 09/18/2013    Priority: High  . Chest pain - at rest, and with exertion; -- angiographically normal coronary arteries, mildly elevated LVEDP 04/11/2013    Priority: High  . Chronic diastolic heart failure, NYHA class 2 ([place])     Priority: High  . Sarcoidosis of lung ([place]) 04/10/2007    Priority: High  . Type II diabetes mellitus with neurological manifestations, uncontrolled ([place]) 08/21/2006    Priority: High  . Fatty liver 09/30/2014    Priority: Medium  . Depression     Priority: Medium  . Gout 08/20/2010    Priority: Medium  . Anemia 09/18/2009    Priority: Medium  . Sleep apnea 04/21/2009    Priority: Medium  . Hyperlipidemia 08/21/2006    Priority: Medium  . Essential hypertension 08/21/2006    Priority: Medium  . Onychomycosis 10/27/2015    Priority: Low  . Hot flashes 07/15/2014    Priority: Low  . Abnormal SPEP 04/17/2014    Priority: Low  . Fracture of left leg 04/17/2014    Priority: Low  . Cushingoid side effect of steroids ([place]) 04/17/2014    Priority: Low  . Internal hemorrhoids     Priority: Low  . Preoperative clearance 03/25/2014    Priority: Low  . Other and unspecified coagulation defects 04/22/2013    Priority: Low  . Solitary pulmonary nodule, on CT 02/2013 - stable over 2 years in 2015 02/20/2013    Priority: Low  . Obesity 06/04/2009   Priority: Low  . GERD 08/21/2006    Priority: Low  . Osteomyelitis of left foot ([place]) 05/29/2015  . MRSA (methicillin resistant staph aureus) culture positive 03/27/2015  . Wound infection complicating hardware ([place]) 03/27/2015    Medications- reviewed  Current Outpatient Prescriptions  Medication Sig Dispense Refill  . allopurinol (ZYLOPRIM) 100 MG tablet Take 100 mg by mouth at bedtime.     [name] aspirin EC 81 MG tablet Take 81 mg by mouth daily.     [name] atorvastatin (LIPITOR) 40 MG tablet TAKE ONE TABLET BY MOUTH ONE TIME DAILY 90 tablet 3  . B-D ULTRAFINE III SHORT PEN 31G X 8 MM MISC FOLLOW DIRECTIONS PROVIDED BY PHYSICIAN 130 each 2  . buPROPion (WELLBUTRIN XL) 300 MG 24 hr tablet Take 1 tablet (300 mg total) by mouth daily. 90 tablet 3  . carvedilol (COREG) 25 MG tablet Take 1 tablet (25 mg total) by mouth 2 (two) times daily with a meal. 180 tablet 3  . cholecalciferol 5000 UNITS TABS Take 1 tablet (5,000 Units total) by mouth daily. (Patient taking differently: Take 5,000 Units by mouth every morning. ) 30 tablet 3  . ciprofloxacin (CIPRO) 500 MG tablet Take as directed  0  . DEXILANT 60 MG capsule TAKE ONE CAPSULE BY MOUTH ONE TIME DAILY 30 capsule 6  . diltiazem (CARTIA XT) 120 MG 24 hr capsule Take 1 capsule (120 mg total) by mouth daily. 30 capsule 1  . estradiol (ESTRACE) 2  MG tablet Take 2 mg by mouth at bedtime.     . fenofibrate (TRICOR) 145 MG tablet Take 1 tablet (145 mg total) by mouth daily. 30 tablet 10  . fluticasone (FLONASE) 50 MCG/ACT nasal spray USE TWO SPRAYS IN EACH NOSTRIL DAILY 16 g 4  . furosemide (LASIX) 40 MG tablet Take 1 tablet (40 mg total) by mouth 2 (two) times daily. PATIENT NEEDS TO CONTACT OFFICE FOR ADDITIONAL REFILLS 60 tablet 0  . gabapentin (NEURONTIN) 100 MG capsule Take 1 capsule (100 mg total) by mouth 3 (three) times daily. 90 capsule 1  . glucose blood (ONE TOUCH ULTRA TEST) test strip 1 each by Other route 3 (three) times daily. Use as  instructed 100 each 5  . insulin aspart (NOVOLOG FLEXPEN) 100 UNIT/ML FlexPen Inject 7-12 Units into the skin 3 (three) times daily with meals. Per sliding scale    . Insulin Pen Needle 31G X 8 MM MISC Use to inject insulin 4 times daily. 130 each 2  . losartan (COZAAR) 100 MG tablet Take 1 tablet (100 mg total) by mouth daily. 90 tablet 3  . metFORMIN (GLUCOPHAGE) 1000 MG tablet TAKE ONE TABLET TWICE DAILY WITH MEALS 180 tablet 0  . mometasone-formoterol (DULERA) 200-5 MCG/ACT AERO Inhale 2 puffs into the lungs 2 (two) times daily. 13 g 5  . PARoxetine Mesylate (BRISDELLE) 7.5 MG CAPS Take 7.5 mg by mouth daily.    Vladimir Faster Glycol-Propyl Glycol (SYSTANE) 0.4-0.3 % SOLN Apply 1 drop to eye at bedtime.    . potassium chloride SA (K-DUR,KLOR-CON) 20 MEQ tablet Take 1 tablet (20 mEq total) by mouth 3 (three) times daily. 90 tablet 1  . predniSONE (DELTASONE) 10 MG tablet Take 5 tablets (50 mg total) by mouth daily with breakfast. Taper down by 10 mg every 3 days to discontinue. 45 tablet 0  . predniSONE (DELTASONE) 5 MG tablet Take 1 tablet (5 mg total) by mouth daily with breakfast. Do not take this while you are on prednisone taper prescribed from the hospital. Resume this after the prednisone taper is completed.    . terbinafine (LAMISIL) 250 MG tablet Take 1 tablet (250 mg total) by mouth daily. 30 tablet 3  . TOUJEO SOLOSTAR 300 UNIT/ML SOPN INJECT 40 UNITS INTO THE SKIN AT BEDTIME 13.5 mL 1  . venlafaxine XR (EFFEXOR-XR) 75 MG 24 hr capsule Take 2 capsules (150 mg total) by mouth at bedtime.    . vitamin B-12 (CYANOCOBALAMIN) 1000 MCG tablet Take 1,000 mcg by mouth daily.    [name] acetaminophen (TYLENOL) 325 MG tablet Take 650 mg by mouth every 6 (six) hours as needed for headache. Reported on 01/05/2016    . albuterol (PROVENTIL HFA;VENTOLIN HFA) 108 (90 BASE) MCG/ACT inhaler Inhale 1-2 puffs into the lungs every 6 (six) hours as needed for wheezing or shortness of breath. (Patient not taking:  Reported on 01/05/2016) 8 g 5  . albuterol (PROVENTIL) (2.5 MG/3ML) 0.083% nebulizer solution Take 3 mLs (2.5 mg total) by nebulization every 6 (six) hours as needed for wheezing or shortness of breath. (Patient not taking: Reported on 01/05/2016) 150 mL 6  . benzonatate (TESSALON) 100 MG capsule Take 100 mg by mouth 3 (three) times daily as needed for cough. Reported on 01/05/2016    . chlorpheniramine-HYDROcodone (TUSSIONEX) 10-8 MG/5ML SUER Take 5 mLs by mouth daily as needed for cough. Reported on 01/05/2016  0  . nitroGLYCERIN (NITROSTAT) 0.4 MG SL tablet Place 0.4 mg under the tongue every 5 (  five) minutes as needed. Reported on 01/05/2016    . ondansetron (ZOFRAN-ODT) 4 MG disintegrating tablet Take 4 mg by mouth every 8 (eight) hours as needed for nausea or vomiting. Reported on 01/05/2016  0   Objective: BP 150/68 mmHg  Pulse 96  Temp(Src) 98.6 F (37 C)  Wt 263 lb (119.296 kg)  SpO2 98% Gen: NAD, resting comfortably HEENT: Turbinates erythematous with green drainage, TM normal, pharynx mildly erythematous with no tonsilar exudate or edema, maxillary sinus tenderness CV: RRR no murmurs rubs or gallops Lungs: CTAB no crackles, wheeze, rhonchi Abdomen: soft/nontender/nondistended/normal bowel sounds.  obese Ext: no edema Skin: warm, dry, no rash  Results for orders placed or performed in visit on 01/05/16 (from the past 24 hour(s))  POCT Influenza A     Status: None   Collection Time: 01/05/16 12:05 PM  Result Value Ref Range   Rapid Influenza A Ag neg    *Note: Due to a large number of results and/or encounters for the requested time period, some results have not been displayed. A complete set of results can be found in Results Review.    Assessment/Plan:  Sinsusitis (new acute condition in high risk patient) Viral based on criteria but patient with sarcoidosis of lung as well as diastolic CHF - she has had multiple hospitalizations often starting with similar symptoms vs. URI  symptoms has often ended up hospitalized. For this reason- we will be rather aggressive here. Treat with augmentin for 7 days and bump prednisone to 10mg  for 5 days then resume 5mg  dosing.   Also with flu exposure tested for flu and negative Results for orders placed or performed in visit on 01/05/16 (from the past 24 hour(s))  POCT Influenza A     Status: None   Collection Time: 01/05/16 12:05 PM  Result Value Ref Range   Rapid Influenza A Ag neg    *Note: Due to a large number of results and/or encounters for the requested time period, some results have not been displayed. A complete set of results can be found in Results Review.   Treatment: -considered steroid: patient already on - boost dose short term -other symptomatic care with mucinex reasonable -Antibiotic indicated: yes  Finally, we reviewed reasons to return to care including if symptoms worsen or persist or new concerns arise.  Meds ordered this encounter  Medications  . amoxicillin-clavulanate (AUGMENTIN) 875-125 MG tablet    Sig: Take 1 tablet by mouth 2 (two) times daily.    Dispense:  14 tablet    Refill:  0    Has history allergy to cephalosporins but has tolerated augmentin well

## 2016-01-05 NOTE — Patient Instructions (Signed)
Sinsusitis Viral based on criteria but patient with sarcoidosis of lung as well as diastolic CHF - she has had multiple hospitalizations often starting with similar symptoms vs. URI symptoms has often ended up hospitalized. For this reason- we will be rather aggressive here. Treat with augmentin for 7 days and bump prednisone to 10mg  for 5 days then resume 5mg  dosing.   Also with flu exposure tested for flu and negative Results for orders placed or performed in visit on 01/05/16 (from the past 24 hour(s))  POCT Influenza A     Status: None   Collection Time: 01/05/16 12:05 PM  Result Value Ref Range   Rapid Influenza A Ag neg    *Note: Due to a large number of results and/or encounters for the requested time period, some results have not been displayed. A complete set of results can be found in Results Review.   Treatment: -considered steroid: patient already on - boost dose short term -other symptomatic care with mucinex reasonable -Antibiotic indicated: yes  Finally, we reviewed reasons to return to care including if symptoms worsen or persist or new concerns arise.  Meds ordered this encounter  Medications  . amoxicillin-clavulanate (AUGMENTIN) 875-125 MG tablet    Sig: Take 1 tablet by mouth 2 (two) times daily.    Dispense:  14 tablet    Refill:  0

## 2016-01-07 ENCOUNTER — Ambulatory Visit: Payer: BLUE CROSS/BLUE SHIELD | Admitting: Internal Medicine

## 2016-01-07 ENCOUNTER — Other Ambulatory Visit: Payer: Self-pay | Admitting: Pulmonary Disease

## 2016-01-07 MED ORDER — DEXLANSOPRAZOLE 60 MG PO CPDR: 1.0000 | DELAYED_RELEASE_CAPSULE | Freq: Every day | ORAL | Status: DC

## 2016-01-07 NOTE — Telephone Encounter (Signed)
Received fax from [name] to refill pt's Dexilant 60mg  Last ov was 3.10.17 w/ TP Last ov w/ PM was 12.15.16 w/ follow up in 6 months >> upcoming ov scheduled for 5.1.17 Refill sent

## 2016-01-11 ENCOUNTER — Other Ambulatory Visit: Payer: Self-pay | Admitting: Cardiology

## 2016-01-15 ENCOUNTER — Other Ambulatory Visit (INDEPENDENT_AMBULATORY_CARE_PROVIDER_SITE_OTHER): Payer: BLUE CROSS/BLUE SHIELD | Admitting: *Deleted

## 2016-01-15 ENCOUNTER — Ambulatory Visit (INDEPENDENT_AMBULATORY_CARE_PROVIDER_SITE_OTHER): Payer: BLUE CROSS/BLUE SHIELD | Admitting: Internal Medicine

## 2016-01-15 ENCOUNTER — Encounter: Payer: Self-pay | Admitting: Internal Medicine

## 2016-01-15 VITALS — BP 124/78 | HR 75 | Temp 97.9°F | Resp 12 | Wt 265.0 lb

## 2016-01-15 DIAGNOSIS — E1149 Type 2 diabetes mellitus with other diabetic neurological complication: Secondary | ICD-10-CM | POA: Diagnosis not present

## 2016-01-15 DIAGNOSIS — E1165 Type 2 diabetes mellitus with hyperglycemia: Secondary | ICD-10-CM

## 2016-01-15 DIAGNOSIS — E1140 Type 2 diabetes mellitus with diabetic neuropathy, unspecified: Secondary | ICD-10-CM

## 2016-01-15 DIAGNOSIS — Z794 Long term (current) use of insulin: Secondary | ICD-10-CM

## 2016-01-15 DIAGNOSIS — IMO0002 Reserved for concepts with insufficient information to code with codable children: Secondary | ICD-10-CM

## 2016-01-15 LAB — LIPID PANEL
Cholesterol: 171 mg/dL (ref 0–200)
HDL: 33.3 mg/dL — ABNORMAL LOW (ref >39.00)
Total CHOL/HDL Ratio: 5
Triglycerides: 446 mg/dL — ABNORMAL HIGH (ref 0.0–149.0)

## 2016-01-15 LAB — POCT GLYCOSYLATED HEMOGLOBIN (HGB A1C): Hemoglobin A1C: 7.5

## 2016-01-15 LAB — HEPATIC FUNCTION PANEL
ALT: 11 U/L (ref 0–35)
AST: 13 U/L (ref 0–37)
Albumin: 4 g/dL (ref 3.5–5.2)
Alkaline Phosphatase: 70 U/L (ref 39–117)
Bilirubin, Direct: 0 mg/dL (ref 0.0–0.3)
Total Bilirubin: 0.4 mg/dL (ref 0.2–1.2)
Total Protein: 6.5 g/dL (ref 6.0–8.3)

## 2016-01-15 LAB — LDL CHOLESTEROL, DIRECT: Direct LDL: 76 mg/dL

## 2016-01-15 MED ORDER — INSULIN ASPART 100 UNIT/ML FLEXPEN: 12.0000 [IU] | PEN_INJECTOR | Freq: Three times a day (TID) | SUBCUTANEOUS | Status: DC

## 2016-01-15 MED ORDER — INSULIN PEN NEEDLE 33G X 4 MM MISC: 1.0000 | Freq: Four times a day (QID) | Status: DC

## 2016-01-15 NOTE — Progress Notes (Signed)
Patient ID: [name], female   DOB: 04/02/1961, 55 y.o.   MRN: [id number]  HPI: [name] is a 55 y.o.-year-old female, returning for f/u DM2, dx 2007, insulin-dependent, uncontrolled, with complications (CHF stage 2, peripheral neuropathy). Last visit 3 mo ago.  She has hospitalized 2x this winter: 01 and 11/2015 >> Prednisone and ABx.  Last hemoglobin A1c was: Lab Results  Component Value Date   HGBA1C 8.4* 10/14/2015   HGBA1C 7.4 10/07/2015   HGBA1C 8.2* 04/28/2015  She is on Prednisone 10 >> 40 >> 7 mg for sarcoidosis.   [name] on a regimen of: - Metformin 1000 mg 2x a day - Toujeo 40 units at bedtime - NovoLog 15 min before every meal: - 8 units with a small meal - 12 units with a regular meal - 16 units with a large meal  She stopped Invokana 300 mg daily - added 01/2014 - had 2 yeast infections (stopped 06/2014) We stopped Glipizide XL 10 mg. We stopped Glyburide 2.5 mg  Pt checks her sugars 3x a day and they are higher: - am: 115-258 >> 200-210 >> 135-225, 266 >> 130-169, 180 >> 100-125 >> 97-210 - 2h after b'fast: n/c >> 200, 315 >> n/c - before lunch: n/c >> n/c >> n/c >> 190s >> n/c >> 130-180 >> 130s >> 163-212 - 2h after lunch: n/c >> n/c >> 414 >> n/c >> 190, 195 - before dinner: n/c >> 230 >> n/c >> 190s >> 268 >> 140-160 >> 130s >> 170-185, 345 (Pred) - 2h after dinner: n/c >> 245 >> 229, 247 >> n/c >> 185, 208 - bedtime: 200s >> 241-462 (mostly 200s) >> 199-395 >> 190s >> 240-342 >> 150-329 >> 150s >> 185-337 - nighttime: n/c No lows. Lowest sugar was 130 >> 100 >> 97; she has hypoglycemia awareness at 70s.  Highest sugar was 500s >> 300s >> 184 >> 337.  Meter: Freestyle Freedom Lite. (last night)   She had a nutrition class >> helped.   Pt's meals are: - Breakfast: egg + bacon or if go out: bisquit - Lunch: sandwich - Dinner: meat + veggies + starch - Snacks: pork skins and cookies - 1-2 a day; also after dinner No exercise.  - no CKD,  last BUN/creatinine:  Lab Results  Component Value Date   BUN 34* 12/09/2015   CREATININE 1.16 12/09/2015  On Losartan 50 mg. - last set of lipids: Lab Results  Component Value Date   CHOL 211* 12/10/2013   HDL 44.80 12/10/2013   LDLCALC UNABLE TO CALCULATE IF TRIGLYCERIDE OVER 400 mg/dL 05/28/2011   LDLDIRECT 117.9 12/10/2013   TRIG 508.0* 12/10/2013   CHOLHDL 5 12/10/2013  On Lipitor. - last eye exam was in 07/17/2015- sees eye dr once a year. No DR. Had cataract sx (left) and now ocular sarcoidosis. - + numbness and tingling in her feet and legs hurt. She has restless legs. We started Neurontin 100 mg hs >> works well.  She also has gout, HL, HTN, sarcoidosis of lung, GERD, COPD.  She had a foot fracture 11/2013 >> screws placed >> infected: MRSA >> cleaned 2x (last by Dr Sharol Given). MRI in 10/2014: no more infections.   I reviewed pt's medications, allergies, PMH, social hx, family hx, and changes were documented in the history of present illness. Otherwise, unchanged from my initial visit note.  ROS: Constitutional: no weight gain/loss, no fatigue Eyes: no blurry vision, no xerophthalmia ENT: no sore throat, no nodules palpated  in throat, no dysphagia/odynophagia, no hoarseness Cardiovascular: no CP/SOB/palpitations/periankle swelling Respiratory: no cough/no SOB Gastrointestinal: no N/V/D/C/heartburn Musculoskeletal: no muscle/joint aches Skin: no rashes Neurological: no tremors/tingling/dizziness  PE: BP 124/78 mmHg  Pulse 75  Temp(Src) 97.9 F (36.6 C) (Oral)  Resp 12  Wt 265 lb (120.203 kg)  SpO2 97% Body mass index is 42.79 kg/(m^2). Wt Readings from Last 3 Encounters:  01/15/16 265 lb (120.203 kg)  01/05/16 263 lb (119.296 kg)  12/19/15 251 lb (113.853 kg)   Constitutional: obese, in NAD Eyes: PERRLA, EOMI, no exophthalmos ENT: moist mucous membranes, no thyromegaly, no cervical lymphadenopathy Cardiovascular: tachycardia, RR, No MRG Respiratory: CTA  B Gastrointestinal: abdomen soft, NT, ND, BS+ Musculoskeletal: no deformities, strength intact in all 4 Skin: moist, warm, no rashes Neurological: no tremor with outstretched hands  ASSESSMENT: 1. DM2, insulin-dependent, uncontrolled, with complications - CHF stage 2 - peripheral neuropathy  2. Diabetic peripheral neuropathy - on neurontin 100 >> works well  PLAN:  1. Patient with long-standing, uncontrolled diabetes, on oral antidiabetic regimen + Basal insulin +  mealtime insulin. Sugars are worse at all times of the day, especially at bedtime >> will increase mealtime insulin. - I suggested to:  Patient Instructions  Please continue: - Metformin 1000 mg 2x a day. - Toujeo 40 units at bedtime  Increase: - NovoLog: - 12 units with a small meal - 16 units with a regular meal - 18-20 units with a large meal or dinner  Please return in 3 months with your sugar log.   - she knows she may need to increase the dose of NovoLog if on Prednisone again  - continue checking sugars at different times of the day - check 3 times a day, rotating checks! - checked HbA1c today >> 7.5% (improved!) - she is up to date with yearly eye exams - needs a Lipid panel >> will check, along with LFTs as she is fasting - Return to clinic in 3 mo with sugar log   2. Diabetic PN - continue Neurontin 100 mg hs  - no SE  Orders Only on 01/15/2016  Component Date Value Ref Range Status  . Hemoglobin A1C 01/15/2016 7.5   Final  Office Visit on 01/15/2016  Component Date Value Ref Range Status  . Cholesterol 01/15/2016 171  0 - 200 mg/dL Final   ATP III Classification       Desirable:  < 200 mg/dL               Borderline High:  200 - 239 mg/dL          High:  > = 240 mg/dL  . Triglycerides 01/15/2016 446.0 Triglyceride is over 400; calculations on Lipids are invalid.* 0.0 - 149.0 mg/dL Final   Normal:  <150 mg/dLBorderline High:  150 - 199 mg/dL  . HDL 01/15/2016 33.30* >39.00 mg/dL Final  . Total  CHOL/HDL Ratio 01/15/2016 5   Final                  Men          Women1/2 Average Risk     3.4          3.3Average Risk          5.0          4.42X Average Risk          9.6          7.13X Average Risk  15.0          11.0                      . Total Bilirubin 01/15/2016 0.4  0.2 - 1.2 mg/dL Final  . Bilirubin, Direct 01/15/2016 0.0  0.0 - 0.3 mg/dL Final  . Alkaline Phosphatase 01/15/2016 70  39 - 117 U/L Final  . AST 01/15/2016 13  0 - 37 U/L Final  . ALT 01/15/2016 11  0 - 35 U/L Final  . Total Protein 01/15/2016 6.5  6.0 - 8.3 g/dL Final  . Albumin 01/15/2016 4.0  3.5 - 5.2 g/dL Final  . Direct LDL 01/15/2016 76.0   Final   Optimal:  <100 mg/dLNear or Above Optimal:  100-129 mg/dLBorderline High:  130-159 mg/dLHigh:  160-189 mg/dLVery High:  >190 mg/dL   Message sent: Dear Ms Loma Sousa, Your liver tests have improved to normal, and your bad cholesterol has decreased. Your triglycerides are still high, but slightly better than before. I see that your medication list mentions fenofibrate. Are you taking this medicine? This will help with the triglycerides. Fish or flax oil can also help. Sincerely, Philemon Kingdom MD

## 2016-01-15 NOTE — Patient Instructions (Signed)
Please continue: - Metformin 1000 mg 2x a day. - Toujeo 40 units at bedtime  Increase: - NovoLog: - 12 units with a small meal - 16 units with a regular meal - 18-20 units with a large meal or dinner  Please return in 3 months with your sugar log.

## 2016-01-21 ENCOUNTER — Other Ambulatory Visit: Payer: Self-pay | Admitting: Cardiology

## 2016-01-21 NOTE — Telephone Encounter (Signed)
Diltiazem Refilled #30 capsules with 7 refills on 01/13/2016

## 2016-01-26 ENCOUNTER — Encounter: Payer: Self-pay | Admitting: Infectious Disease

## 2016-01-26 ENCOUNTER — Ambulatory Visit
Admission: RE | Admit: 2016-01-26 | Discharge: 2016-01-26 | Disposition: A | Discharge status: Home / Self Care | Payer: BLUE CROSS/BLUE SHIELD | Source: Ambulatory Visit | Attending: Infectious Disease | Admitting: Infectious Disease

## 2016-01-26 ENCOUNTER — Ambulatory Visit (INDEPENDENT_AMBULATORY_CARE_PROVIDER_SITE_OTHER): Payer: BLUE CROSS/BLUE SHIELD | Admitting: Infectious Disease

## 2016-01-26 VITALS — BP 144/73 | HR 99 | Temp 97.9°F | Wt 255.0 lb

## 2016-01-26 DIAGNOSIS — M86672 Other chronic osteomyelitis, left ankle and foot: Secondary | ICD-10-CM

## 2016-01-26 DIAGNOSIS — D860 Sarcoidosis of lung: Secondary | ICD-10-CM

## 2016-01-26 DIAGNOSIS — T847XXD Infection and inflammatory reaction due to other internal orthopedic prosthetic devices, implants and grafts, subsequent encounter: Secondary | ICD-10-CM

## 2016-01-26 DIAGNOSIS — M79672 Pain in left foot: Secondary | ICD-10-CM | POA: Diagnosis not present

## 2016-01-26 NOTE — Progress Notes (Signed)
Chief complaint: followup for calcaneal osteomyelitis  Subjective:    Patient ID: [name], female    DOB: January 22, 1961, 55 y.o.   MRN: [id number]  HPI   55 year old female with multiple medical problems who initially had a fracture of her 5th metatarsal and developed non union. Underwent ORIF with bone grafting but then fell and broke the 5th metatarsal again. She underwent hardware removal and ORIF again  with midfoot reconstruction. The calcaneal osteotomy did not heal well and in May 2016 noted draiange, which she describes as white, pus like drainage. Developed swelling of foot and leg. She was started on Septra DS, 2 pills bid with rifampin though did not resolve. Underwent I and D in [place] with associated hardware removal but presented here to Dr. Percell Miller with persistent drainage. She underwent I and D and superficial swab with MRSA and CoNS, no bone culture done. She was started on and has continued with vancomycin and rifampin. Unfortunately, she developed AKI and was reshospitalized. Her creat was increased to 1.87 and vancomycin stopped and started linezolid. Her creat remained stable, though still up from her baseline at 1.68 after her visit to Dr. Yong Channel. She was sent out on doxycyline after her insurace refused linezolid coverage. She developed a rash with this but continued the doxycyline, until seen by Dr. Linus Salmons roughly 16 days ago. She was taken off doxycycline but since has seen Dr. Para March who is concerned that the patient had worsening left heel pain which is hard for her now to bear. Patient had labs done which showed an elevated sedimentation rate above 50. 4 to layer creatinine had now improved to 1.35. See labs of lobe picture.  She had  been restarted on doxycycline and has had re-emergence of her rash is intensely pruritic  When I later saw  her I was concerned for worsening osteomyelitis and obtained MRI of foot on 06/03/15 which showed:  IMPRESSION: 1.  There is evidence of prior calcaneal osteotomy of the left posterior calcaneus with hardware removal. There is a soft tissue wound along the posterior lateral aspect of the left hindfoot extending to the lateral osteotomy site of the calcaneus. There is soft tissue enhancement and osseous enhancement with mild cortical irregularity along the lateral aspect of the calcaneal osteotomy site which may reflect postsurgical granulation tissue, but osteomyelitis cannot be completely excluded. No drainable fluid collection.  She was seen b y Dr. Sharol Given with orthopedic surgery. I had stopped her doxycyline in hopes that we might obtain helpful cultures in the OR to guide therapy if she underwent surgery   She  was seen by Dr Sharol Given who restarted her oral doxycycline and then admitted her to [facility] where he performed Partial Excision Left Calcaneus, Place Antibiotic Beads, and Wound VAC on 06/27/15. The patient states that she has not been on ANY antibiotics postoperatively whatsoever. Her cultures on doxycycline did not yield any organism. She has NOT had worsening of pain or drainage since her surgery which was a month prior to my seeing her again. I DID propose however that she undergo course of rx with IV antibiotics to maximize her chances of control, cure of this calcaneal osteomyelitis and we started IV daptomicin to which she apparently developed a rash. This was stopped and she was changed to IV teflaro which she also developed a rash. She was NOT able to get through the 8 weeks of IV abx we had planned instead roughly 32 days of IV antibiotics.  We did not switch her to an oral antibiotic due to her multiple allergies.   Since coming off antibiotics she had experienced worsening pain in her heel both with weight bearing and at rest. No nausea vomiting, fevers or systemic symptoms.  Her inflammatory markers were up and we were worried and therefore he obtained an MRI in mid December which  showed:   IMPRESSION: 1. Interval progressive healing of calcaneal osteotomy. No evidence of osteomyelitis. 2. The lateral hindfoot soft tissue wound/tract demonstrates mild contraction. No evidence of soft tissue abscess. 3. Peroneal tenosynovitis and subcutaneous edema in the distal lower leg have mildly worsened.   She has had interval improvement in her wound and pain in her heel. She did have an admission to the hospital for a flare of her sarcoidosis that is not just involving her lungs but her eyes she is going to [facility] to see Dr. Sandy Salaam for follow-up for this. She is also being seen by Dr. Randon Goldsmith here in [name] with regards to the ophthalmic involvement.  Her inflammatory markers were elevated when she was inpatient but is concerned well be due to her sarcoid.  She returns to clinic again today with worsening left heel pain, foot pain swelling. She is without fevers, nausea or systemic symptoms.      Past Medical History  Diagnosis Date  . DIABETES MELLITUS, TYPE II 08/21/2006  . HYPERLIPIDEMIA 08/21/2006  . GOUT 08/20/2010  . OBESITY 06/04/2009  . ANEMIA-UNSPECIFIED 09/18/2009  . HYPERTENSION 08/21/2006  . GERD 08/21/2006  . Internal hemorrhoids   . Pulmonary sarcoidosis (HCC)     Followed locally by pulmonology, but also by Dr. Sandy Salaam at [facility]  . Exertional chest pain     sharp, substernal, exertional  . Vocal cord dysfunction   . Chronic diastolic heart failure, NYHA class 2 (HCC)     LVEDP roughly 20% by cath  . CHF (congestive heart failure) (HCC)   . COPD (chronic obstructive pulmonary disease) (HCC)   . Depression   . Fracture of 5th metatarsal     non union  . Abnormal SPEP 04/17/2014  . Osteomyelitis of left foot (HCC) 05/29/2015  . Diabetic osteomyelitis (HCC) 05/29/2015  . Hx of umbilical hernia repair   . Infection of wound due to methicillin resistant Staphylococcus aureus (MRSA)   . Wears partial dentures   . Onychomycosis  10/27/2015    Past Surgical History  Procedure Laterality Date  . Ventral hernia repair    . Nissen fundoplication  2004  . Cholecystectomy  1984  . Abdominal hysterectomy    . Knee arthroscopy      right  . Tubal ligation      with reversal in 1994  . Bladder suspension  11/11/2011    Procedure: TRANSVAGINAL TAPE (TVT) PROCEDURE;  Surgeon: Levi Aland, MD;  Location: [facility];  Service: Gynecology;  Laterality: N/A;  . Cystoscopy  11/11/2011    Procedure: CYSTOSCOPY;  Surgeon: Levi Aland, MD;  Location: [facility];  Service: Gynecology;  Laterality: N/A;  . Doppler echocardiography  02/12/2013    LV FUNCTION, SIZE NORMAL; MILD CONCENTRIC LVH; EST EF 55-65%; WALL MOTION NORMAL  . Lexiscan myoview  03/09/2013    EF 50%; NORMAL MYOCARDIAL PERFUSION STUDY - breast attenuation  . Cardiac catheterization  07/2010    LVEF 50-55% WITH VERY MILD GLOBAL HYPOKINESIA; ESSENTIALLY NORMAL CORONARY ARTERIES; NORMAL LV FUNCTION  . Carotids  02/18/11    CAROTID DUPLEX; VERTEBRALS ARE PATENT WITH  ANTEGRADE FLOW. ICA/CCA RATIO 1.61 ON RIGHT AND 0.75 ON LEFT  . Right and left cardiac catheterization  04/23/2013    Angiographic normal coronaries; LVEDP 20 mmHg, PCWP 12-14 mmHg, RAP 12 mmHg.; Fick CO/CI 4.9/2.2  . Metatarsal osteotomy with open reduction internal fixation (orif) metatarsal with fusion Left 04/09/2014    Procedure: LEFT FOOT FRACTURE OPEN TREATMENT METATARSAL INCLUDES INTERNAL FIXATION EACH;  Surgeon: Lorn Junes, MD;  Location: [place];  Service: Orthopedics;  Laterality: Left;  . Left and right heart catheterization with coronary angiogram N/A 04/23/2013    Procedure: LEFT AND RIGHT HEART CATHETERIZATION WITH CORONARY ANGIOGRAM;  Surgeon: Leonie Man, MD;  Location: [facility] CATH LAB;  Service: Cardiovascular;  Laterality: N/A;  . Hernia repair    . Appendectomy    . Fracture surgery      foot  . I&d extremity Left 06/27/2015    Procedure: Partial Excision Left  Calcaneus, Place Antibiotic Beads, and Wound VAC;  Surgeon: Newt Minion, MD;  Location: [place];  Service: Orthopedics;  Laterality: Left;    Family History  Problem Relation Age of Onset  . Diabetes Father   . Heart attack Father   . Coronary artery disease Father   . Heart failure Father   . COPD Mother   . Emphysema Mother   . Asthma Mother   . Heart failure Mother   . Sarcoidosis Maternal Uncle   . Colon cancer Neg Hx   . Lung cancer Brother   . Diabetes Brother   . Heart attack Maternal Grandfather       Social History   Social History  . Marital Status: Married    Spouse Name: BAANI BOBER  . Number of Children: 2  . Years of Education: 12   Occupational History  . DISABLED    Social History Main Topics  . Smoking status: Never Smoker   . Smokeless tobacco: Never Used  . Alcohol Use: No  . Drug Use: No  . Sexual Activity: Yes    Birth Control/ Protection: Surgical   Other Topics Concern  . None   Social History Narrative   Married 1994. 2 sons who both live close and 1 grandson.       Disability due to sarcoidosis. 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 Methotrexate Other (See Comments)     peri-oral and buccal lesions.  . Vancomycin Other (See Comments)    nephrotoxicity  . Clindamycin/Lincomycin Nausea And Vomiting and Rash  . Chlorhexidine Itching  . Doxycycline Rash  . Lisinopril Cough  . Teflaro [Ceftaroline] Rash     Current outpatient prescriptions:  .  acetaminophen (TYLENOL) 325 MG tablet, Take 650 mg by mouth every 6 (six) hours as needed for headache. Reported on 01/05/2016, Disp: , Rfl:  .  albuterol (PROVENTIL HFA;VENTOLIN HFA) 108 (90 BASE) MCG/ACT inhaler, Inhale 1-2 puffs into the lungs every 6 (six) hours as needed for wheezing or shortness of  breath., Disp: 8 g, Rfl: 5 .  albuterol (PROVENTIL) (2.5 MG/3ML) 0.083% nebulizer solution, Take 3 mLs (2.5 mg total) by nebulization every 6 (six) hours as needed for wheezing or shortness of breath., Disp: 150 mL, Rfl: 6 .  allopurinol (ZYLOPRIM) 100 MG tablet, Take 100 mg by mouth at bedtime. , Disp: ,  Rfl:  .  aspirin EC 81 MG tablet, Take 81 mg by mouth daily. , Disp: , Rfl:  .  atorvastatin (LIPITOR) 40 MG tablet, TAKE ONE TABLET BY MOUTH ONE TIME DAILY, Disp: 90 tablet, Rfl: 3 .  benzonatate (TESSALON) 100 MG capsule, Take 100 mg by mouth 3 (three) times daily as needed for cough. Reported on 01/05/2016, Disp: , Rfl:  .  buPROPion (WELLBUTRIN XL) 300 MG 24 hr tablet, Take 1 tablet (300 mg total) by mouth daily., Disp: 90 tablet, Rfl: 3 .  carvedilol (COREG) 25 MG tablet, Take 1 tablet (25 mg total) by mouth 2 (two) times daily with a meal., Disp: 180 tablet, Rfl: 3 .  chlorpheniramine-HYDROcodone (TUSSIONEX) 10-8 MG/5ML SUER, Take 5 mLs by mouth daily as needed for cough. Reported on 01/05/2016, Disp: , Rfl: 0 .  cholecalciferol 5000 UNITS TABS, Take 1 tablet (5,000 Units total) by mouth daily. (Patient taking differently: Take 5,000 Units by mouth every morning. ), Disp: 30 tablet, Rfl: 3 .  ciprofloxacin (CIPRO) 500 MG tablet, Take as directed, Disp: , Rfl: 0 .  dexlansoprazole (DEXILANT) 60 MG capsule, Take 1 capsule (60 mg total) by mouth daily., Disp: 30 capsule, Rfl: 5 .  diltiazem (CARDIZEM CD) 120 MG 24 hr capsule, TAKE ONE CAPSULE BY MOUTH EVERY DAY, Disp: 30 capsule, Rfl: 7 .  diltiazem (CARTIA XT) 120 MG 24 hr capsule, Take 1 capsule (120 mg total) by mouth daily., Disp: 30 capsule, Rfl: 1 .  estradiol (ESTRACE) 2 MG tablet, Take 2 mg by mouth at bedtime. , Disp: , Rfl:  .  fenofibrate (TRICOR) 145 MG tablet, Take 1 tablet (145 mg total) by mouth daily., Disp: 30 tablet, Rfl: 10 .  fluticasone (FLONASE) 50 MCG/ACT nasal spray, USE TWO SPRAYS IN EACH NOSTRIL DAILY, Disp: 16 g, Rfl:  4 .  furosemide (LASIX) 40 MG tablet, Take 1 tablet (40 mg total) by mouth 2 (two) times daily. PATIENT NEEDS TO CONTACT OFFICE FOR ADDITIONAL REFILLS, Disp: 60 tablet, Rfl: 0 .  furosemide (LASIX) 40 MG tablet, TAKE 1 TABLET (40 MG TOTAL) BY MOUTH 2 (TWO) TIMES DAILY., Disp: 60 tablet, Rfl: 7 .  gabapentin (NEURONTIN) 100 MG capsule, Take 1 capsule (100 mg total) by mouth 3 (three) times daily., Disp: 90 capsule, Rfl: 1 .  glucose blood (ONE TOUCH ULTRA TEST) test strip, 1 each by Other route 3 (three) times daily. Use as instructed, Disp: 100 each, Rfl: 5 .  insulin aspart (NOVOLOG FLEXPEN) 100 UNIT/ML FlexPen, Inject 12-20 Units into the skin 3 (three) times daily with meals. Per sliding scale, Disp: 30 mL, Rfl: 5 .  Insulin Pen Needle 33G X 4 MM MISC, 1 each by Does not apply route 4 (four) times daily., Disp: 200 each, Rfl: 11 .  losartan (COZAAR) 100 MG tablet, Take 1 tablet (100 mg total) by mouth daily., Disp: 90 tablet, Rfl: 3 .  metFORMIN (GLUCOPHAGE) 1000 MG tablet, TAKE ONE TABLET TWICE DAILY WITH MEALS, Disp: 180 tablet, Rfl: 0 .  mometasone-formoterol (DULERA) 200-5 MCG/ACT AERO, Inhale 2 puffs into the lungs 2 (two) times daily., Disp: 13 g, Rfl: 5 .  nitroGLYCERIN (NITROSTAT) 0.4 MG SL tablet, Place 0.4 mg under the tongue every 5 (five) minutes as needed. Reported on 01/05/2016, Disp: , Rfl:  .  ondansetron (ZOFRAN-ODT) 4 MG disintegrating tablet, Take 4 mg by mouth every 8 (eight) hours as needed for nausea or vomiting. Reported on 01/05/2016, Disp: , Rfl: 0 .  PARoxetine Mesylate (  BRISDELLE) 7.5 MG CAPS, Take 7.5 mg by mouth daily., Disp: , Rfl:  .  Polyethyl Glycol-Propyl Glycol (SYSTANE) 0.4-0.3 % SOLN, Apply 1 drop to eye at bedtime., Disp: , Rfl:  .  potassium chloride SA (K-DUR,KLOR-CON) 20 MEQ tablet, Take 1 tablet (20 mEq total) by mouth 3 (three) times daily., Disp: 90 tablet, Rfl: 1 .  predniSONE (DELTASONE) 10 MG tablet, Take 5 tablets (50 mg total) by mouth daily with  breakfast. Taper down by 10 mg every 3 days to discontinue., Disp: 45 tablet, Rfl: 0 .  predniSONE (DELTASONE) 5 MG tablet, Take 1 tablet (5 mg total) by mouth daily with breakfast. Do not take this while you are on prednisone taper prescribed from the hospital. Resume this after the prednisone taper is completed., Disp: , Rfl:  .  terbinafine (LAMISIL) 250 MG tablet, Take 1 tablet (250 mg total) by mouth daily., Disp: 30 tablet, Rfl: 3 .  TOUJEO SOLOSTAR 300 UNIT/ML SOPN, INJECT 40 UNITS INTO THE SKIN AT BEDTIME, Disp: 13.5 mL, Rfl: 1 .  venlafaxine XR (EFFEXOR-XR) 75 MG 24 hr capsule, Take 2 capsules (150 mg total) by mouth at bedtime., Disp: , Rfl:  .  vitamin B-12 (CYANOCOBALAMIN) 1000 MCG tablet, Take 1,000 mcg by mouth daily., Disp: , Rfl:       Review of Systems  Constitutional: Negative for fever, chills, diaphoresis, activity change, appetite change, fatigue and unexpected weight change.  HENT: Negative for congestion, rhinorrhea, sinus pressure, sneezing, sore throat and trouble swallowing.   Eyes: Negative for photophobia and visual disturbance.  Respiratory: Negative for cough, chest tightness, shortness of breath, wheezing and stridor.   Cardiovascular: Negative for chest pain, palpitations and leg swelling.  Gastrointestinal: Negative for nausea, vomiting, abdominal pain, diarrhea, constipation, blood in stool, abdominal distention and anal bleeding.  Genitourinary: Negative for dysuria, hematuria, flank pain and difficulty urinating.  Musculoskeletal: Positive for arthralgias. Negative for myalgias, back pain, joint swelling and gait problem.  Skin: Positive for rash. Negative for color change, pallor and wound.  Neurological: Negative for dizziness, tremors, weakness and light-headedness.  Hematological: Negative for adenopathy. Does not bruise/bleed easily.  Psychiatric/Behavioral: Negative for behavioral problems, confusion, sleep disturbance, dysphoric mood, decreased  concentration and agitation.       Objective:   Physical Exam  Constitutional: She is oriented to person, place, and time. She appears well-developed and well-nourished. No distress.  HENT:  Head: Normocephalic and atraumatic.  Mouth/Throat: No oropharyngeal exudate.  Eyes: Conjunctivae and EOM are normal. No scleral icterus.  Neck: Normal range of motion. Neck supple.  Cardiovascular: Normal rate and regular rhythm.   Pulmonary/Chest: Effort normal. No respiratory distress. She has no wheezes.  Abdominal: She exhibits no distension.  Musculoskeletal: She exhibits no edema or tenderness.  Neurological: She is alert and oriented to person, place, and time. She exhibits normal muscle tone. Coordination normal.  Skin: Skin is warm and dry. She is not diaphoretic. No erythema.  Psychiatric: She has a normal mood and affect. Her behavior is normal. Judgment and thought content normal.     Left foot 05/29/2015:    Left heel today 01/26/16:       Heel continues to appear well-healed     Left foot 09/11/15: note a new scar from where she dropped a curling iron on her foot      Right foot:          Assessment & Plan:   55 year old with calcaneal osteomyelitis and metatarsal infection originally due to  fracture also with diabetes mellitus. Above infection was complicated by presence of hardware. MRI in August showed osteomyelitis persisting sp surgery by Dr Sharol Given with deep cultures (though ON abx) and culture unrevealing. We tried IV abx but only got through 33 of planned 56 days due to rashes to every IV abx we had tried.   MRI was encouraging and she had done well off antibiotics.  She is now having worsening pain in her heel.   I will recheck ESR, and CRP and if up may get another MRI. In the meantime will get plain films and encourage her to see. Dr. Sharol Given again   Multiple allergies on various antibiotics: wonder if many of these are driven by supratentorial or in  fact subconscious suggestion or if she ireally is allergic to all of these different classes of antibiotics   AKI from vancomycin resolved    Sarcoid flare with lung and eye involvement: followed by [facility] pulmonary and is also being seen by Dr. Gillian Scarce with ophthalmology  I spent greater than 25   minutes with the patient including greater than 50% of time in face to face counsel of the patient regarding her diabetic foot infection calcaneal osteomyelitis metatarsal infection MRSA colonization and rashes, allergies with various abx y from vancomycin,Her sarcoid and in coordination of her care.

## 2016-01-27 ENCOUNTER — Telehealth: Payer: Self-pay | Admitting: *Deleted

## 2016-01-27 LAB — BASIC METABOLIC PANEL WITH GFR
BUN: 16 mg/dL (ref 7–25)
CO2: 26 mmol/L (ref 20–31)
Calcium: 9 mg/dL (ref 8.6–10.4)
Chloride: 100 mmol/L (ref 98–110)
Creat: 0.97 mg/dL (ref 0.50–1.05)
GFR, Est African American: 76 mL/min (ref >=60)
GFR, Est Non African American: 65 mL/min (ref >=60)
Glucose, Bld: 197 mg/dL — ABNORMAL HIGH (ref 65–99)
Potassium: 4.2 mmol/L (ref 3.5–5.3)
Sodium: 138 mmol/L (ref 135–146)

## 2016-01-27 LAB — CBC WITH DIFFERENTIAL/PLATELET
Basophils Absolute: 123 cells/uL (ref 0–200)
Basophils Relative: 1 %
Eosinophils Absolute: 615 cells/uL — ABNORMAL HIGH (ref 15–500)
Eosinophils Relative: 5 %
HCT: 30.6 % — ABNORMAL LOW (ref 35.0–45.0)
Hemoglobin: 9.8 g/dL — ABNORMAL LOW (ref 11.7–15.5)
Lymphocytes Relative: 28 %
Lymphs Abs: 3444 cells/uL (ref 850–3900)
MCH: 29 pg (ref 27.0–33.0)
MCHC: 32 g/dL (ref 32.0–36.0)
MCV: 90.5 fL (ref 80.0–100.0)
MPV: 8.7 fL (ref 7.5–12.5)
Monocytes Absolute: 861 cells/uL (ref 200–950)
Monocytes Relative: 7 %
Neutro Abs: 7257 cells/uL (ref 1500–7800)
Neutrophils Relative %: 59 %
Platelets: 358 10*3/uL (ref 140–400)
RBC: 3.38 MIL/uL — ABNORMAL LOW (ref 3.80–5.10)
RDW: 15.3 % — ABNORMAL HIGH (ref 11.0–15.0)
WBC: 12.3 10*3/uL — ABNORMAL HIGH (ref 3.8–10.8)

## 2016-01-27 LAB — SEDIMENTATION RATE: Sed Rate: 38 mm/hr — ABNORMAL HIGH (ref 0–30)

## 2016-01-27 LAB — C-REACTIVE PROTEIN: CRP: 2.5 mg/dL — ABNORMAL HIGH (ref <0.60)

## 2016-01-27 NOTE — Telephone Encounter (Signed)
Labs and xrays are re-assuring vs ones we saw before that. She should see Dr. Sharol Given and then followup with me as planned

## 2016-01-27 NOTE — Telephone Encounter (Signed)
Patient notified

## 2016-01-27 NOTE — Telephone Encounter (Signed)
eXCELLENT! 

## 2016-01-27 NOTE — Telephone Encounter (Signed)
Patient called for her lab and xray results. Please advise

## 2016-01-29 DIAGNOSIS — H1031 Unspecified acute conjunctivitis, right eye: Secondary | ICD-10-CM | POA: Diagnosis not present

## 2016-01-29 DIAGNOSIS — H0015 Chalazion left lower eyelid: Secondary | ICD-10-CM | POA: Diagnosis not present

## 2016-02-05 DIAGNOSIS — M86272 Subacute osteomyelitis, left ankle and foot: Secondary | ICD-10-CM | POA: Diagnosis not present

## 2016-02-05 DIAGNOSIS — M79672 Pain in left foot: Secondary | ICD-10-CM | POA: Diagnosis not present

## 2016-02-09 ENCOUNTER — Ambulatory Visit: Payer: BLUE CROSS/BLUE SHIELD | Admitting: Pulmonary Disease

## 2016-02-12 ENCOUNTER — Ambulatory Visit (INDEPENDENT_AMBULATORY_CARE_PROVIDER_SITE_OTHER): Payer: BLUE CROSS/BLUE SHIELD | Admitting: Pulmonary Disease

## 2016-02-12 ENCOUNTER — Encounter: Payer: Self-pay | Admitting: Pulmonary Disease

## 2016-02-12 VITALS — BP 128/62 | HR 85 | Ht 66.0 in | Wt 259.0 lb

## 2016-02-12 DIAGNOSIS — J4541 Moderate persistent asthma with (acute) exacerbation: Secondary | ICD-10-CM | POA: Diagnosis not present

## 2016-02-12 DIAGNOSIS — M1712 Unilateral primary osteoarthritis, left knee: Secondary | ICD-10-CM | POA: Diagnosis not present

## 2016-02-12 DIAGNOSIS — D860 Sarcoidosis of lung: Secondary | ICD-10-CM

## 2016-02-12 LAB — PULMONARY FUNCTION TEST
DL/VA % pred: 100 %
DL/VA: 5.08 ml/min/mmHg/L
DLCO cor % pred: 70 %
DLCO cor: 18.87 ml/min/mmHg
DLCO unc % pred: 65 %
DLCO unc: 17.67 ml/min/mmHg
FEF 25-75 Post: 2.37 L/sec
FEF 25-75 Pre: 1.86 L/sec
FEF2575-%Change-Post: 27 %
FEF2575-%Pred-Post: 90 %
FEF2575-%Pred-Pre: 71 %
FEV1-%Change-Post: 5 %
FEV1-%Pred-Post: 65 %
FEV1-%Pred-Pre: 61 %
FEV1-Post: 1.85 L
FEV1-Pre: 1.75 L
FEV1FVC-%Change-Post: 4 %
FEV1FVC-%Pred-Pre: 104 %
FEV6-%Change-Post: 0 %
FEV6-%Pred-Post: 60 %
FEV6-%Pred-Pre: 59 %
FEV6-Post: 2.13 L
FEV6-Pre: 2.12 L
FEV6FVC-%Pred-Post: 103 %
FEV6FVC-%Pred-Pre: 103 %
FVC-%Change-Post: 0 %
FVC-%Pred-Post: 58 %
FVC-%Pred-Pre: 58 %
FVC-Post: 2.13 L
FVC-Pre: 2.12 L
Post FEV1/FVC ratio: 87 %
Post FEV6/FVC ratio: 100 %
Pre FEV1/FVC ratio: 83 %
Pre FEV6/FVC Ratio: 100 %
RV % pred: 80 %
RV: 1.62 L
TLC % pred: 75 %
TLC: 4.01 L

## 2016-02-12 MED ORDER — PREDNISONE 1 MG PO TABS: 1.0000 mg | ORAL_TABLET | Freq: Every day | ORAL | Status: DC

## 2016-02-12 MED ORDER — LEVOFLOXACIN 500 MG PO TABS: 500.0000 mg | ORAL_TABLET | Freq: Every day | ORAL | Status: DC

## 2016-02-12 NOTE — Progress Notes (Signed)
PFT done today. 

## 2016-02-12 NOTE — Progress Notes (Signed)
Subjective:    Patient ID: [name], female    DOB: 11-10-1960, 55 y.o.   MRN: [id number]  PROBLEM LIST: Sarcoidosis Chronic bronchitis Vocal cord dysfunction  HPI   Follow-up for sarcoidosis, bronchitis. [name] is a 55 year old former patient of Dr. Joya Gaskins. She was diagnosed with sarcoidosis with mediastinoscopy in 2008. He is followed by Dr. Casper Harrison in [facility] with regular biannual follow-ups. She had been on high doses of prednisone. This has been slowly tapered to 5 mg a day. She is being maintained on this for several years now without any exacerbations. Review of her lung function tests and imaging show moderate restrictive defects with mild reduction in DLCO. The CT scan does not show any obvious pulmonary involvement with her sarcoidosis. Her sarcoidosis is believed to have an airway component as well since she has recurrent attacks of bronchitis. She is also diagnosed with vocal cord dysfunction by Dr. Joya Gaskins several years ago.  She was hospitalized in February with respiratory distress. It was not clear if this was a sarcoid flare as a CXR at that time did not show any acute abnormality. She was treated with antibiotics and prednisone. She feels well now. She does complain of some sinus pressure and nasal discharge. No fevers or chills.  DATA: CT chest (07/03/13) No CT findings to suggest sarcoidosis. A single tiny punctate indeterminate nodule is noted. The nodule's oval configuration makes an intrapulmonary lymph node or other benign entity more likely. If patient has no risk factors for lung cancer, no  further followup would be warranted.  Chest x-ray 11/30/15 No acute cardiopulmonary disease.  PFTs 02/12/16 FVC 2.12 (58%) FEV1 1.75 (1%) F/F 83 TLC 75% DLCO 65% Moderate restriction and reduction in diffusion capacity. Improved compared to 2015  10/22/14 FVC 2.12 (57%) FEV1 1.57 (54%) F/F 74 DLCO 74.5%  03/25/14  FVC 2.09 [60%] FEV1 1.63 (59%] F/F  78 Moderate restriction  Surgical pathology, mediastinal lymph nodes 11/07/06 Nonnecrotizing granulomas.  Past Medical History  Diagnosis Date  . DIABETES MELLITUS, TYPE II 08/21/2006  . HYPERLIPIDEMIA 08/21/2006  . GOUT 08/20/2010  . OBESITY 06/04/2009  . ANEMIA-UNSPECIFIED 09/18/2009  . HYPERTENSION 08/21/2006  . GERD 08/21/2006  . Internal hemorrhoids   . Pulmonary sarcoidosis ([place])     Followed locally by pulmonology, but also by Dr. Casper Harrison at [facility]  . Exertional chest pain     sharp, substernal, exertional  . Vocal cord dysfunction   . Chronic diastolic heart failure, NYHA class 2 (HCC)     LVEDP roughly 20% by cath  . CHF (congestive heart failure) ([place])   . COPD (chronic obstructive pulmonary disease) ([place])   . Depression   . Fracture of 5th metatarsal     non union  . Abnormal SPEP 04/17/2014  . Osteomyelitis of left foot ([place]) 05/29/2015  . Diabetic osteomyelitis ([place]) 05/29/2015  . Hx of umbilical hernia repair   . Infection of wound due to methicillin resistant Staphylococcus aureus (MRSA)   . Wears partial dentures   . Onychomycosis 10/27/2015     Current outpatient prescriptions:  .  acetaminophen (TYLENOL) 325 MG tablet, Take 650 mg by mouth every 6 (six) hours as needed for headache. Reported on 01/05/2016, Disp: , Rfl:  .  albuterol (PROVENTIL HFA;VENTOLIN HFA) 108 (90 BASE) MCG/ACT inhaler, Inhale 1-2 puffs into the lungs every 6 (six) hours as needed for wheezing or shortness of breath., Disp: 8 g, Rfl: 5 .  albuterol (PROVENTIL) (2.5 MG/3ML) 0.083% nebulizer  solution, Take 3 mLs (2.5 mg total) by nebulization every 6 (six) hours as needed for wheezing or shortness of breath., Disp: 150 mL, Rfl: 6 .  allopurinol (ZYLOPRIM) 100 MG tablet, Take 100 mg by mouth at bedtime. , Disp: , Rfl:  .  aspirin EC 81 MG tablet, Take 81 mg by mouth daily. , Disp: , Rfl:  .  atorvastatin (LIPITOR) 40 MG tablet, TAKE ONE TABLET BY MOUTH ONE TIME DAILY, Disp: 90  tablet, Rfl: 3 .  benzonatate (TESSALON) 100 MG capsule, Take 100 mg by mouth 3 (three) times daily as needed for cough. Reported on 01/05/2016, Disp: , Rfl:  .  buPROPion (WELLBUTRIN XL) 300 MG 24 hr tablet, Take 1 tablet (300 mg total) by mouth daily., Disp: 90 tablet, Rfl: 3 .  carvedilol (COREG) 25 MG tablet, Take 1 tablet (25 mg total) by mouth 2 (two) times daily with a meal., Disp: 180 tablet, Rfl: 3 .  chlorpheniramine-HYDROcodone (TUSSIONEX) 10-8 MG/5ML SUER, Take 5 mLs by mouth daily as needed for cough. Reported on 01/05/2016, Disp: , Rfl: 0 .  cholecalciferol 5000 UNITS TABS, Take 1 tablet (5,000 Units total) by mouth daily. (Patient taking differently: Take 5,000 Units by mouth every morning. ), Disp: 30 tablet, Rfl: 3 .  ciprofloxacin (CIPRO) 500 MG tablet, Take as directed, Disp: , Rfl: 0 .  dexlansoprazole (DEXILANT) 60 MG capsule, Take 1 capsule (60 mg total) by mouth daily., Disp: 30 capsule, Rfl: 5 .  diltiazem (CARDIZEM CD) 120 MG 24 hr capsule, TAKE ONE CAPSULE BY MOUTH EVERY DAY, Disp: 30 capsule, Rfl: 7 .  diltiazem (CARTIA XT) 120 MG 24 hr capsule, Take 1 capsule (120 mg total) by mouth daily., Disp: 30 capsule, Rfl: 1 .  estradiol (ESTRACE) 2 MG tablet, Take 2 mg by mouth at bedtime. , Disp: , Rfl:  .  fenofibrate (TRICOR) 145 MG tablet, Take 1 tablet (145 mg total) by mouth daily., Disp: 30 tablet, Rfl: 10 .  fluticasone (FLONASE) 50 MCG/ACT nasal spray, USE TWO SPRAYS IN EACH NOSTRIL DAILY, Disp: 16 g, Rfl: 4 .  furosemide (LASIX) 40 MG tablet, Take 1 tablet (40 mg total) by mouth 2 (two) times daily. PATIENT NEEDS TO CONTACT OFFICE FOR ADDITIONAL REFILLS, Disp: 60 tablet, Rfl: 0 .  furosemide (LASIX) 40 MG tablet, TAKE 1 TABLET (40 MG TOTAL) BY MOUTH 2 (TWO) TIMES DAILY., Disp: 60 tablet, Rfl: 7 .  gabapentin (NEURONTIN) 100 MG capsule, Take 1 capsule (100 mg total) by mouth 3 (three) times daily., Disp: 90 capsule, Rfl: 1 .  glucose blood (ONE TOUCH ULTRA TEST) test strip,  1 each by Other route 3 (three) times daily. Use as instructed, Disp: 100 each, Rfl: 5 .  insulin aspart (NOVOLOG FLEXPEN) 100 UNIT/ML FlexPen, Inject 12-20 Units into the skin 3 (three) times daily with meals. Per sliding scale, Disp: 30 mL, Rfl: 5 .  Insulin Pen Needle 33G X 4 MM MISC, 1 each by Does not apply route 4 (four) times daily., Disp: 200 each, Rfl: 11 .  losartan (COZAAR) 100 MG tablet, Take 1 tablet (100 mg total) by mouth daily., Disp: 90 tablet, Rfl: 3 .  metFORMIN (GLUCOPHAGE) 1000 MG tablet, TAKE ONE TABLET TWICE DAILY WITH MEALS, Disp: 180 tablet, Rfl: 0 .  mometasone-formoterol (DULERA) 200-5 MCG/ACT AERO, Inhale 2 puffs into the lungs 2 (two) times daily., Disp: 13 g, Rfl: 5 .  nitroGLYCERIN (NITROSTAT) 0.4 MG SL tablet, Place 0.4 mg under the tongue every 5 (five)  minutes as needed. Reported on 01/05/2016, Disp: , Rfl:  .  ondansetron (ZOFRAN-ODT) 4 MG disintegrating tablet, Take 4 mg by mouth every 8 (eight) hours as needed for nausea or vomiting. Reported on 01/05/2016, Disp: , Rfl: 0 .  PARoxetine Mesylate (BRISDELLE) 7.5 MG CAPS, Take 7.5 mg by mouth daily., Disp: , Rfl:  .  Polyethyl Glycol-Propyl Glycol (SYSTANE) 0.4-0.3 % SOLN, Apply 1 drop to eye at bedtime., Disp: , Rfl:  .  potassium chloride SA (K-DUR,KLOR-CON) 20 MEQ tablet, Take 1 tablet (20 mEq total) by mouth 3 (three) times daily., Disp: 90 tablet, Rfl: 1 .  predniSONE (DELTASONE) 5 MG tablet, Take 1 tablet (5 mg total) by mouth daily with breakfast. Do not take this while you are on prednisone taper prescribed from the hospital. Resume this after the prednisone taper is completed., Disp: , Rfl:  .  terbinafine (LAMISIL) 250 MG tablet, Take 1 tablet (250 mg total) by mouth daily., Disp: 30 tablet, Rfl: 3 .  TOUJEO SOLOSTAR 300 UNIT/ML SOPN, INJECT 40 UNITS INTO THE SKIN AT BEDTIME, Disp: 13.5 mL, Rfl: 1 .  venlafaxine XR (EFFEXOR-XR) 75 MG 24 hr capsule, Take 2 capsules (150 mg total) by mouth at bedtime., Disp: ,  Rfl:  .  vitamin B-12 (CYANOCOBALAMIN) 1000 MCG tablet, Take 1,000 mcg by mouth daily., Disp: , Rfl:  .  HYDROcodone-acetaminophen (NORCO/VICODIN) 5-325 MG tablet, 1 tablet by mouth 2-3 times a day as needed for pain, Disp: , Rfl: 0   Review of Systems  Denies any cough, wheezing, sputum production, hemoptysis Denies any chest pain, palpitations. Denies any nausea, vomiting, diarrhea, constipation. Denies any fevers, chills, malaise, fatigue, loss of weight, loss of appetite. All other review of systems are negative.    Objective:   Physical Exam Blood pressure 128/62, pulse 85, height 5\' 6"  (1.676 m), weight 259 lb (117.482 kg), SpO2 98 %. Gen: No apparent distress Neuro: No gross focal deficits. Neck: No JVD, lymphadenopathy, thyromegaly. RS: Clear, no wheeze, crackles. [name]: S1-S2 heard, no murmurs rubs gallops. Abdomen: Soft, positive bowel sounds. Extremities: No edema.    Assessment & Plan:  #1 Sarcoidosis She's had a recent admission for resp distress.  It is unclear to me if this was actually a sarcoid flare and is more likely secondary to bronchitis, asthma flare, vocal cord dysfunction. She has moderate restrictive defect which is likely are related to her habitus, obesity. CT scan in the past did not show any parenchymal involvement by sarcoidosis but there may be some interstitial process that is not obvious on the CAT scan.   She is being followed by Dr. Casper Harrison at [facility] and is maintained on prednisone at 5 mg. I reviewed his last note and he recommends reduction in dose of prednisone by 1 mg every few months. We will start her on 4 mg and she will return on 3 months for follow up  #2 Chronic bronchitis, vocal cord dysfunction. She suffers from recurrent bronchitis. PFTs today do not show overt obstruction by F/F criteria but there is a reduction in her mid flow rates with improvement postbronchodilator. I'll switch her Dulera to Advair to see if there is a change in her  symptoms. She is also complaining of some sinus symptoms. I'll give her a course of levofloxacin.   Plan: - Switch dulera to advair, continue albuterol rescue inhaler - Reduce pred to 4 mg daily - Levofloxacin for 7 days.  Return in 3 months.  Marshell Garfinkel MD [facility] Pager [phone] If no answer or after 3pm call: [phone] 02/12/2016, 10:59 AM

## 2016-02-12 NOTE — Patient Instructions (Addendum)
Will give you levofloxacin 500 mg daily for 7 days. Continue Flonase nasal spray. We change her Dulera to Advair 500/50. We'll reduce his steroid dose to 4 mg daily.  Return to clinic in 3 months.

## 2016-02-18 DIAGNOSIS — H1033 Unspecified acute conjunctivitis, bilateral: Secondary | ICD-10-CM | POA: Diagnosis not present

## 2016-03-01 ENCOUNTER — Other Ambulatory Visit: Payer: Self-pay | Admitting: Internal Medicine

## 2016-03-06 IMAGING — DX DG CHEST 2V
2 series · 2 of 2 positions shown · non-contrast
Comparison: CT chest 01/24/2014, chest x-ray 09/15/2014

CLINICAL DATA: Shortness of breath for 3 days with dry cough for
3-4 days.

EXAM:
CHEST  2 VIEW

[chest pa]
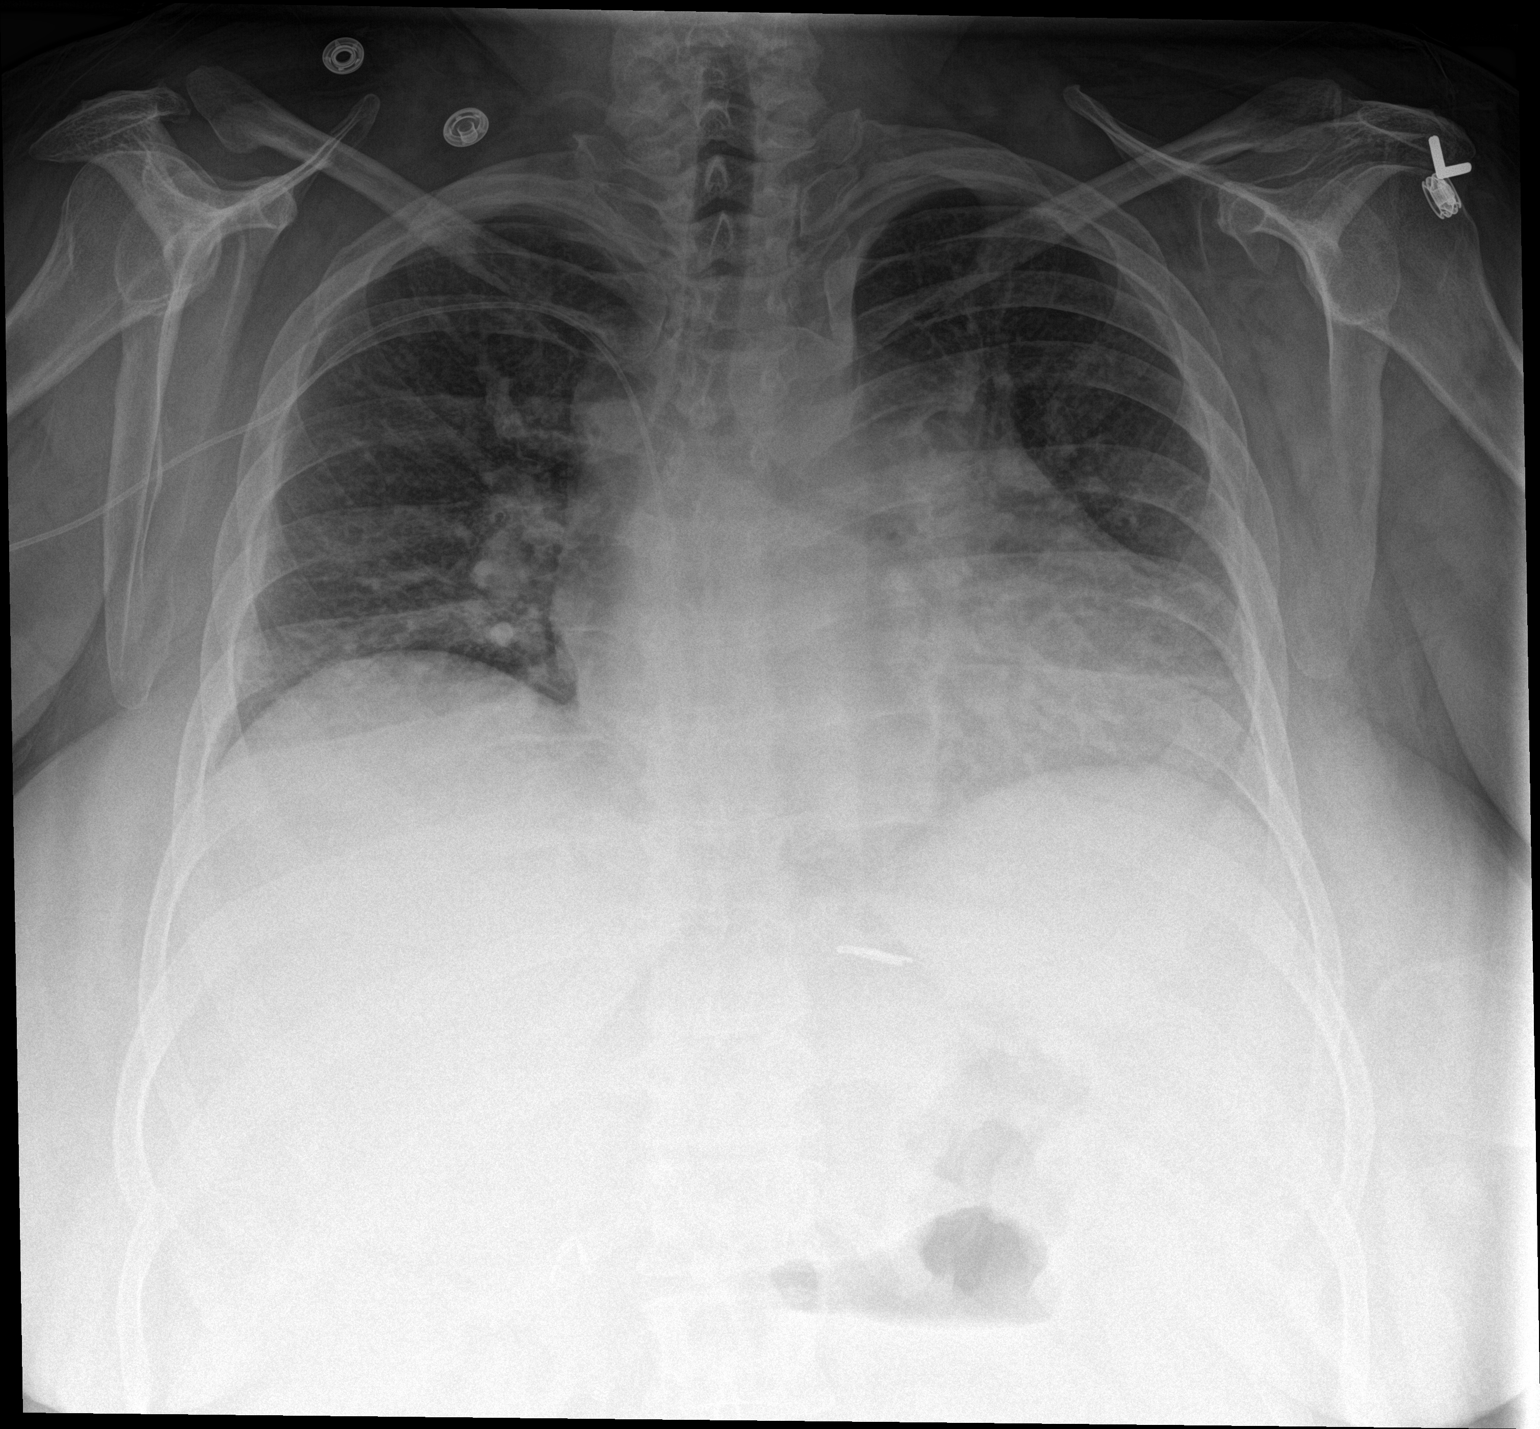

[chest lat]
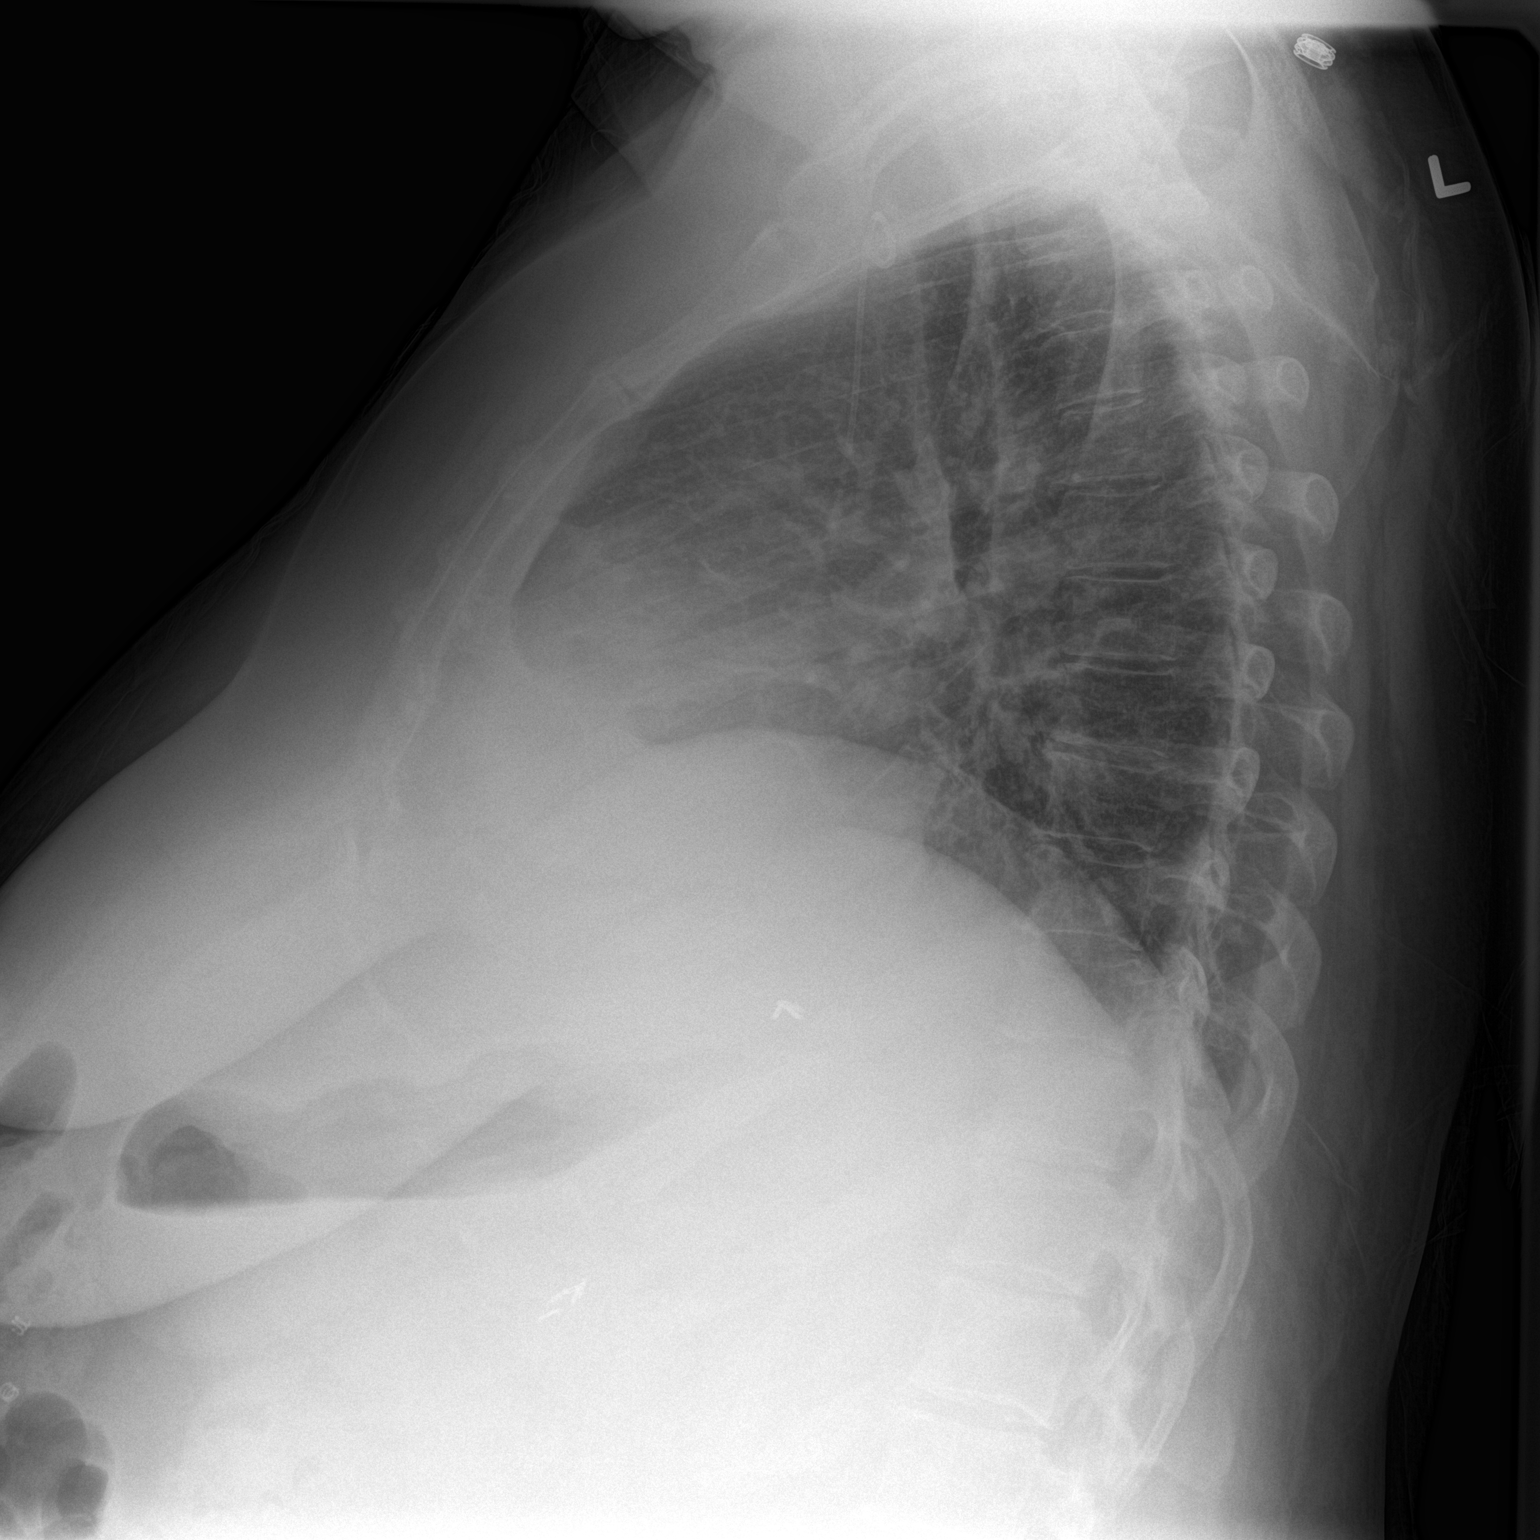

[2 of 2 positions shown; findings below may reference images not displayed]

FINDINGS: There are low lung volumes. There is mild bilateral interstitial
thickening. There is no focal consolidation, pleural effusion or
pneumothorax. The heart and mediastinum are stable. There is a
right-sided PICC line with the tip projecting over the SVC.

The osseous structures are unremarkable.
IMPRESSION: 1. Mild bilateral interstitial thickening which may be secondary to
low lung volumes versus chronic interstitial disease given the
patient's history of sarcoidosis versus interstitial infection

## 2016-03-08 ENCOUNTER — Other Ambulatory Visit: Payer: Self-pay | Admitting: Internal Medicine

## 2016-03-10 ENCOUNTER — Ambulatory Visit: Payer: BLUE CROSS/BLUE SHIELD | Admitting: Infectious Disease

## 2016-03-11 ENCOUNTER — Ambulatory Visit: Payer: BLUE CROSS/BLUE SHIELD | Admitting: Family Medicine

## 2016-03-16 IMAGING — CR DG CHEST 2V
2 series · 2 of 2 positions shown · non-contrast
Comparison: PA and lateral chest x-ray April 27, 2015

CLINICAL DATA: Follow-up from recent hospitalization, currently
asymptomatic, history of sarcoidosis, CHF, and COPD.

EXAM:
CHEST  2 VIEW

[view not recorded (1 of 2)]
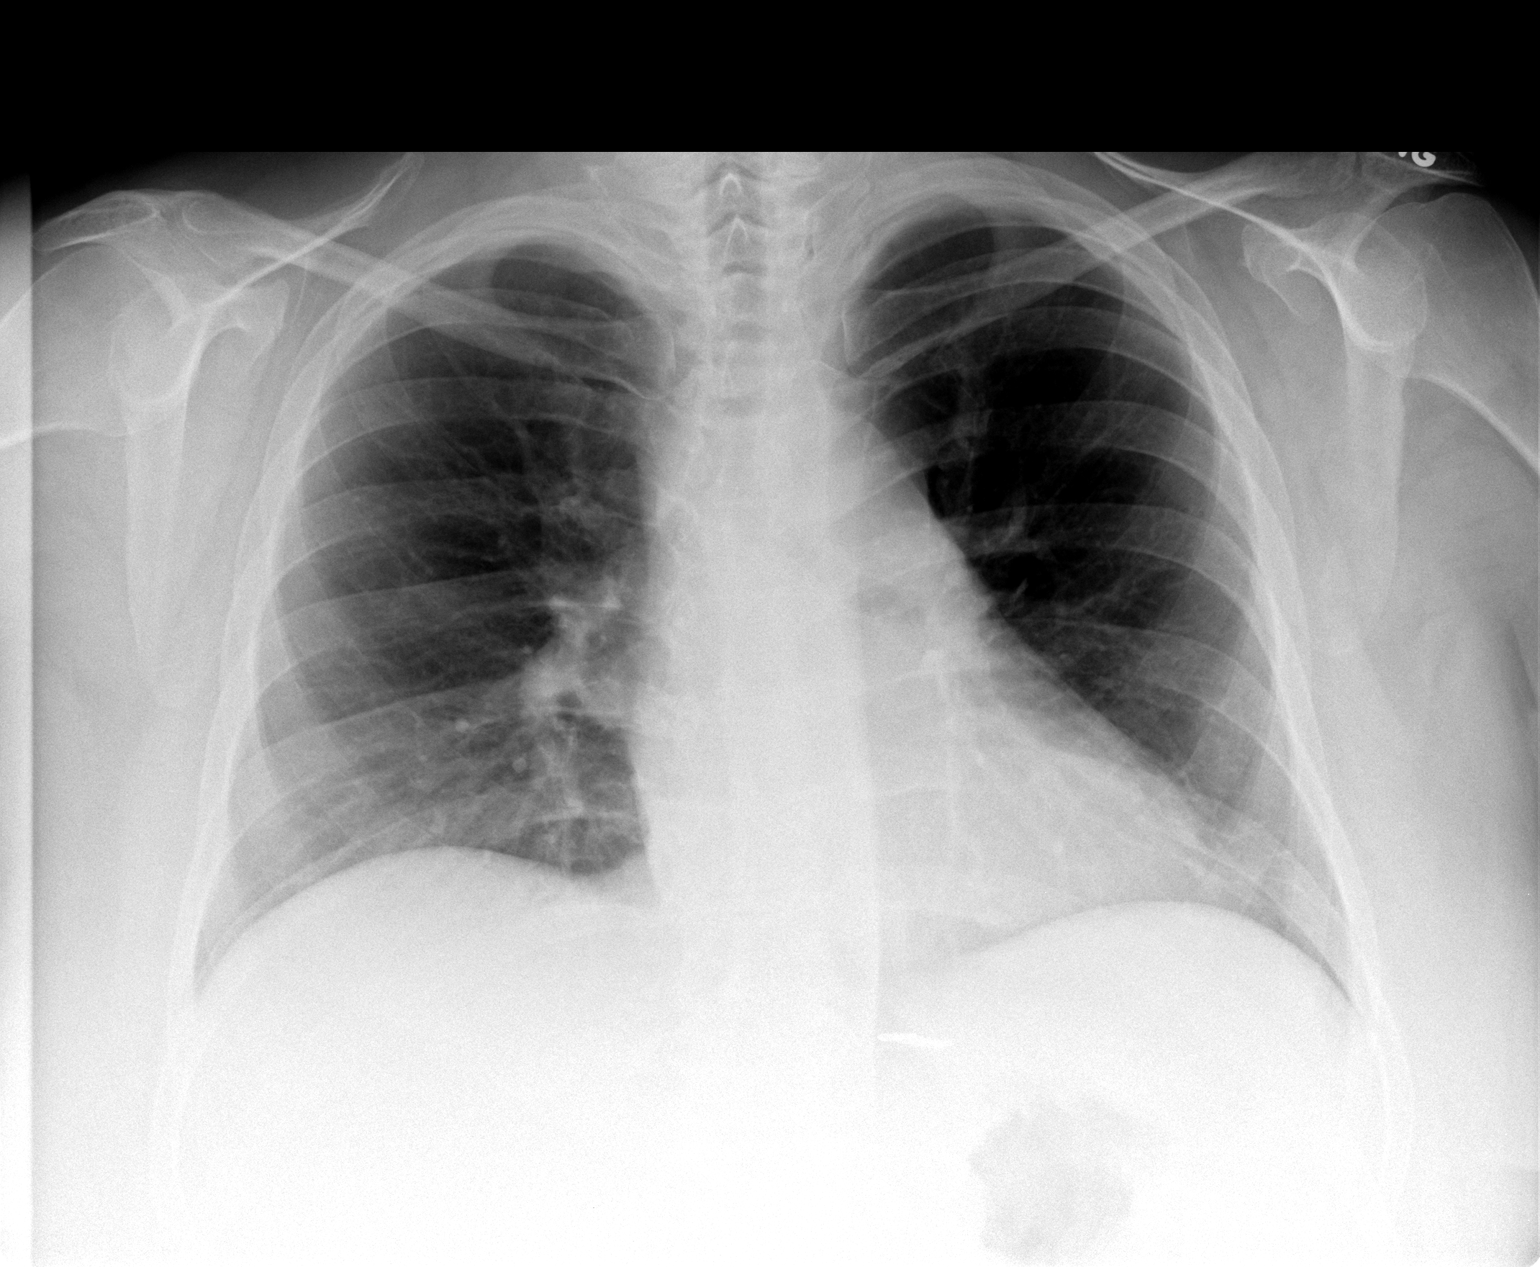

[view not recorded (2 of 2)]
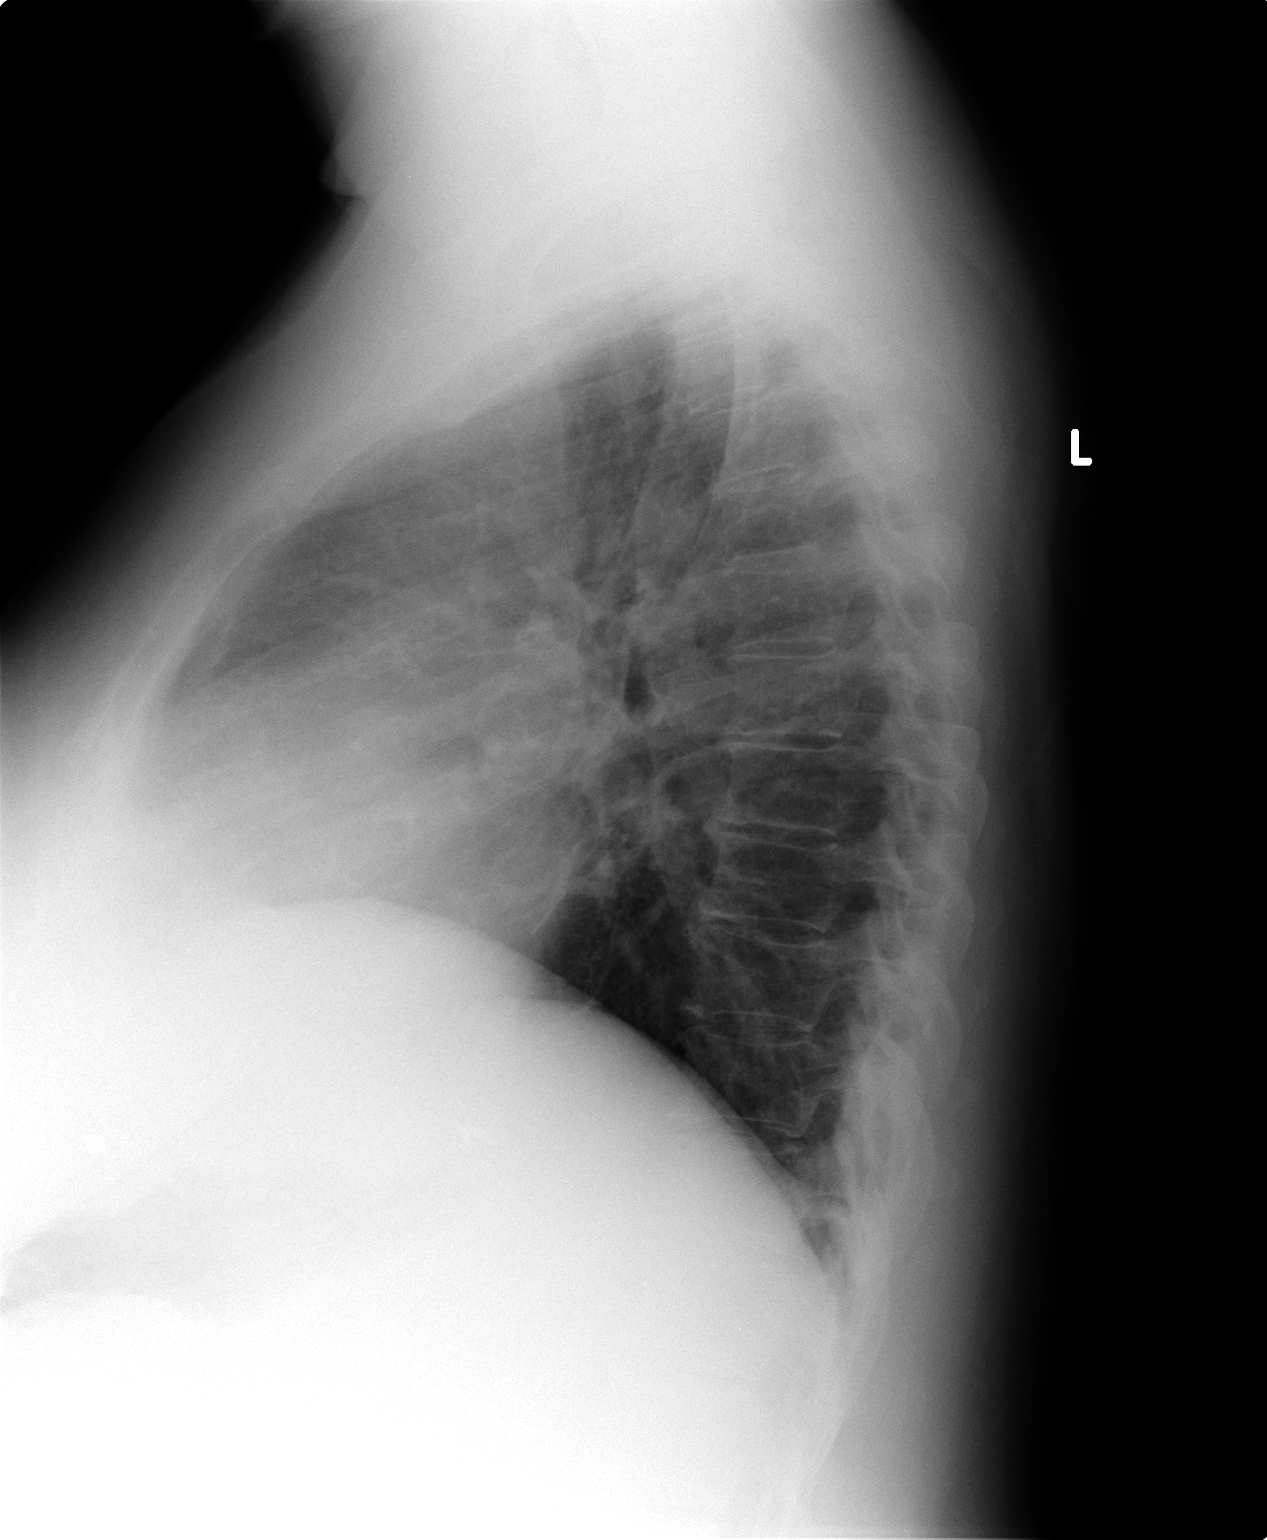

[2 of 2 positions shown; findings below may reference images not displayed]

FINDINGS: The lungs are adequately inflated and clear. The heart and pulmonary
vascularity are normal. The mediastinum is normal in width. There is
no pleural effusion. The bony thorax exhibits no acute abnormality.
There is a surgical clip overlying the GE junction.
IMPRESSION: There is no active cardiopulmonary disease.

## 2016-03-23 ENCOUNTER — Ambulatory Visit (INDEPENDENT_AMBULATORY_CARE_PROVIDER_SITE_OTHER): Payer: BLUE CROSS/BLUE SHIELD | Admitting: Family Medicine

## 2016-03-23 ENCOUNTER — Other Ambulatory Visit: Payer: Self-pay | Admitting: Family Medicine

## 2016-03-23 ENCOUNTER — Encounter: Payer: Self-pay | Admitting: Family Medicine

## 2016-03-23 VITALS — BP 118/58 | HR 87 | Temp 98.1°F | Ht 66.0 in | Wt 257.0 lb

## 2016-03-23 DIAGNOSIS — J329 Chronic sinusitis, unspecified: Secondary | ICD-10-CM

## 2016-03-23 DIAGNOSIS — Z7289 Other problems related to lifestyle: Secondary | ICD-10-CM | POA: Diagnosis not present

## 2016-03-23 DIAGNOSIS — R21 Rash and other nonspecific skin eruption: Secondary | ICD-10-CM | POA: Diagnosis not present

## 2016-03-23 DIAGNOSIS — B9789 Other viral agents as the cause of diseases classified elsewhere: Secondary | ICD-10-CM

## 2016-03-23 DIAGNOSIS — Z20828 Contact with and (suspected) exposure to other viral communicable diseases: Secondary | ICD-10-CM | POA: Diagnosis not present

## 2016-03-23 DIAGNOSIS — B349 Viral infection, unspecified: Secondary | ICD-10-CM

## 2016-03-23 MED ORDER — TRIAMCINOLONE ACETONIDE 0.1 % EX CREA: 1.0000 "application " | TOPICAL_CREAM | Freq: Two times a day (BID) | CUTANEOUS | Status: DC

## 2016-03-23 NOTE — Progress Notes (Signed)
Pre visit review using our clinic review tool, if applicable. No additional management support is needed unless otherwise documented below in the visit note. 

## 2016-03-23 NOTE — Telephone Encounter (Signed)
Rx refill sent to pharmacy. 

## 2016-03-23 NOTE — Patient Instructions (Addendum)
Labs before you leave  Use triamcinolone faithfully for 10 days on rash on abdomen. Stop and inform me if worsens.   For sinus symptoms- if worsens please let me know. Give me update 2-3 days on rash and sinus symptoms regardless. Send in augmentin if not starting to feel better

## 2016-03-23 NOTE — Progress Notes (Signed)
Subjective:  [name] is a 55 y.o. year old very pleasant female patient who presents for/with See problem oriented charting ROS- see any ROS included in HPI as well.   Past Medical History-  Patient Active Problem List   Diagnosis Date Noted  . Obstructive chronic bronchitis without exacerbation ([place]) 09/18/2013    Priority: High  . Chest pain - at rest, and with exertion; -- angiographically normal coronary arteries, mildly elevated LVEDP 04/11/2013    Priority: High  . Chronic diastolic heart failure, NYHA class 2 ([place])     Priority: High  . Sarcoidosis of lung ([place]) 04/10/2007    Priority: High  . Type II diabetes mellitus with neurological manifestations, uncontrolled ([place]) 08/21/2006    Priority: High  . Fatty liver 09/30/2014    Priority: Medium  . Depression     Priority: Medium  . Gout 08/20/2010    Priority: Medium  . Anemia 09/18/2009    Priority: Medium  . Sleep apnea 04/21/2009    Priority: Medium  . Hyperlipidemia 08/21/2006    Priority: Medium  . Essential hypertension 08/21/2006    Priority: Medium  . Onychomycosis 10/27/2015    Priority: Low  . Hot flashes 07/15/2014    Priority: Low  . Abnormal SPEP 04/17/2014    Priority: Low  . Fracture of left leg 04/17/2014    Priority: Low  . Cushingoid side effect of steroids ([place]) 04/17/2014    Priority: Low  . Internal hemorrhoids     Priority: Low  . Preoperative clearance 03/25/2014    Priority: Low  . Other and unspecified coagulation defects 04/22/2013    Priority: Low  . Solitary pulmonary nodule, on CT 02/2013 - stable over 2 years in 2015 02/20/2013    Priority: Low  . Obesity 06/04/2009    Priority: Low  . GERD 08/21/2006    Priority: Low  . Osteomyelitis of left foot ([place]) 05/29/2015  . MRSA (methicillin resistant staph aureus) culture positive 03/27/2015  . Wound infection complicating hardware ([place]) 03/27/2015    Medications- reviewed and updated Current Outpatient Prescriptions   Medication Sig Dispense Refill  . acetaminophen (TYLENOL) 325 MG tablet Take 650 mg by mouth every 6 (six) hours as needed for headache. Reported on 01/05/2016    . albuterol (PROVENTIL HFA;VENTOLIN HFA) 108 (90 BASE) MCG/ACT inhaler Inhale 1-2 puffs into the lungs every 6 (six) hours as needed for wheezing or shortness of breath. 8 g 5  . albuterol (PROVENTIL) (2.5 MG/3ML) 0.083% nebulizer solution Take 3 mLs (2.5 mg total) by nebulization every 6 (six) hours as needed for wheezing or shortness of breath. 150 mL 6  . allopurinol (ZYLOPRIM) 100 MG tablet Take 100 mg by mouth at bedtime.     [name] aspirin EC 81 MG tablet Take 81 mg by mouth daily.     [name] atorvastatin (LIPITOR) 40 MG tablet TAKE ONE TABLET BY MOUTH ONE TIME DAILY 90 tablet 3  . benzonatate (TESSALON) 100 MG capsule Take 100 mg by mouth 3 (three) times daily as needed for cough. Reported on 01/05/2016    . buPROPion (WELLBUTRIN XL) 300 MG 24 hr tablet Take 1 tablet (300 mg total) by mouth daily. 90 tablet 3  . carvedilol (COREG) 25 MG tablet Take 1 tablet (25 mg total) by mouth 2 (two) times daily with a meal. 180 tablet 3  . chlorpheniramine-HYDROcodone (TUSSIONEX) 10-8 MG/5ML SUER Take 5 mLs by mouth daily as needed for cough. Reported on 01/05/2016  0  .  cholecalciferol 5000 UNITS TABS Take 1 tablet (5,000 Units total) by mouth daily. (Patient taking differently: Take 5,000 Units by mouth every morning. ) 30 tablet 3  . ciprofloxacin (CIPRO) 500 MG tablet Take as directed  0  . dexlansoprazole (DEXILANT) 60 MG capsule Take 1 capsule (60 mg total) by mouth daily. 30 capsule 5  . diltiazem (CARDIZEM CD) 120 MG 24 hr capsule TAKE ONE CAPSULE BY MOUTH EVERY DAY 30 capsule 7  . diltiazem (CARTIA XT) 120 MG 24 hr capsule Take 1 capsule (120 mg total) by mouth daily. 30 capsule 1  . estradiol (ESTRACE) 2 MG tablet Take 2 mg by mouth at bedtime.     . fenofibrate (TRICOR) 145 MG tablet Take 1 tablet (145 mg total) by mouth daily. 30 tablet 10  .  fluticasone (FLONASE) 50 MCG/ACT nasal spray USE TWO SPRAYS IN EACH NOSTRIL DAILY 16 g 2  . furosemide (LASIX) 40 MG tablet TAKE 1 TABLET (40 MG TOTAL) BY MOUTH 2 (TWO) TIMES DAILY. 60 tablet 7  . gabapentin (NEURONTIN) 100 MG capsule TAKE ONE CAPSULE BY MOUTH THREE TIMES DAILY 90 capsule 2  . glucose blood (ONE TOUCH ULTRA TEST) test strip 1 each by Other route 3 (three) times daily. Use as instructed 100 each 5  . HYDROcodone-acetaminophen (NORCO/VICODIN) 5-325 MG tablet 1 tablet by mouth 2-3 times a day as needed for pain  0  . insulin aspart (NOVOLOG FLEXPEN) 100 UNIT/ML FlexPen Inject 12-20 Units into the skin 3 (three) times daily with meals. Per sliding scale 30 mL 5  . Insulin Pen Needle 33G X 4 MM MISC 1 each by Does not apply route 4 (four) times daily. 200 each 11  . losartan (COZAAR) 100 MG tablet Take 1 tablet (100 mg total) by mouth daily. 90 tablet 3  . metFORMIN (GLUCOPHAGE) 1000 MG tablet TAKE ONE TABLET BY MOUTH TWICE DAILY WITH MEALS 180 tablet 0  . mometasone-formoterol (DULERA) 200-5 MCG/ACT AERO Inhale 2 puffs into the lungs 2 (two) times daily. 13 g 5  . nitroGLYCERIN (NITROSTAT) 0.4 MG SL tablet Place 0.4 mg under the tongue every 5 (five) minutes as needed. Reported on 01/05/2016    . ondansetron (ZOFRAN-ODT) 4 MG disintegrating tablet Take 4 mg by mouth every 8 (eight) hours as needed for nausea or vomiting. Reported on 01/05/2016  0  . PARoxetine Mesylate (BRISDELLE) 7.5 MG CAPS Take 7.5 mg by mouth daily.    Vladimir Faster Glycol-Propyl Glycol (SYSTANE) 0.4-0.3 % SOLN Apply 1 drop to eye at bedtime.    . potassium chloride SA (K-DUR,KLOR-CON) 20 MEQ tablet Take 1 tablet (20 mEq total) by mouth 3 (three) times daily. 90 tablet 1  . PREDNISONE PO Take 4 mg by mouth daily.    [name] terbinafine (LAMISIL) 250 MG tablet Take 1 tablet (250 mg total) by mouth daily. 30 tablet 3  . TOUJEO SOLOSTAR 300 UNIT/ML SOPN INJECT 40 UNITS INTO THE SKIN AT BEDTIME 13.5 mL 1  . venlafaxine XR  (EFFEXOR-XR) 75 MG 24 hr capsule Take 2 capsules (150 mg total) by mouth at bedtime.    . vitamin B-12 (CYANOCOBALAMIN) 1000 MCG tablet Take 1,000 mcg by mouth daily.     No current facility-administered medications for this visit.    Objective: BP 118/58 mmHg  Pulse 87  Temp(Src) 98.1 F (36.7 C) (Oral)  Ht 5\' 6"  (1.676 m)  Wt 257 lb (116.574 kg)  BMI 41.50 kg/m2  SpO2 97% Gen: NAD, resting comfortably TM normal,  oropharynx normal, erythema in nares with yellow drainage. Tender frontal sinuses.  CV: RRR no murmurs rubs or gallops Lungs: CTAB no crackles, wheeze, rhonchi Abdomen: soft/nontender/nondistended/normal bowel sounds.  Skin: warm, dry,  on left side of abdomen has multiple erythematous macules/papules and several of these are excoriated.  Neuro: grossly normal, moves all extremities  Assessment/Plan:  Rash S:on left side of abdomen for 2-3 weeks. ITches. Not painful. Seems to get better then get worse. Benadryl anti itch helps some. No new detergents/fabric softeners.  ROS-not ill appearing, no fever/chills. No new medications. Not immunocompromised. No mucus membrane involvement.  A/P: unclear etiology- treat with triamcinolone for 10 days. Does not appear fungal. If does not resolve- consider dermatology referral  Viral sinusitis S: 4 days of sinus pressure frontal with green drainage. Mild cough. Minimal sore throat.  ROS- no fever, chills, nausea, shortness of breath.  A/P: patient high risk for bacterial illness and has ended up in hospital in past for similar. She is day 4 and symptoms stable- we opted to monitor and start augmentin only if worsens given recurrent exposure to antibiotic.s   3-4 month regular follow up.   Orders Placed This Encounter  Procedures  . Hepatitis C antibody, reflex    [facility]  . HIV antibody    Meds ordered this encounter  Medications  . PREDNISONE PO    Sig: Take 4 mg by mouth daily.  [name] triamcinolone cream (KENALOG) 0.1 %     Sig: Apply 1 application topically 2 (two) times daily. To abdomen rash for 10 days.    Dispense:  30 g    Refill:  0  2 new acute issues - 1 with med management- sinusitis high risk in patient with baseline pulmonary disease and tendency to be hospitalized  Return precautions advised.  Garret Reddish, MD

## 2016-03-24 LAB — HIV ANTIBODY (ROUTINE TESTING W REFLEX): HIV 1&2 Ab, 4th Generation: NONREACTIVE

## 2016-03-24 LAB — HEPATITIS C ANTIBODY: HCV Ab: NEGATIVE

## 2016-03-26 ENCOUNTER — Telehealth: Payer: Self-pay | Admitting: Family Medicine

## 2016-03-26 NOTE — Telephone Encounter (Signed)
Husband call to say Dr Yong Channel wanted his wife to let him know how things are going. He call today to report pt rash is better but she is still having headaches.

## 2016-03-28 NOTE — Telephone Encounter (Signed)
Thanks for update. Glad rash is better. Did not discuss headaches at visit- if she is concerned more than happy to have her in to discuss

## 2016-03-29 ENCOUNTER — Ambulatory Visit: Payer: BLUE CROSS/BLUE SHIELD | Admitting: Infectious Disease

## 2016-03-30 DIAGNOSIS — M79672 Pain in left foot: Secondary | ICD-10-CM | POA: Diagnosis not present

## 2016-03-30 DIAGNOSIS — M86272 Subacute osteomyelitis, left ankle and foot: Secondary | ICD-10-CM | POA: Diagnosis not present

## 2016-04-08 ENCOUNTER — Ambulatory Visit: Payer: BLUE CROSS/BLUE SHIELD | Admitting: Family Medicine

## 2016-04-12 IMAGING — MR MR FOOT*L* WO/W CM
4 of 9 series · 18 of 40 positions shown · IV contrast (multihance)
Comparison: None.

CLINICAL DATA: Ankle pins removed from the heel 03/19/2015. Left
great toe pain. Lateral ankle pain.

EXAM:
MRI OF THE LEFT FOREFOOT WITHOUT AND WITH CONTRAST
TECHNIQUE: Multiplanar, multisequence MR imaging was performed both before and
after administration of intravenous contrast.
CONTRAST:  10mL MULTIHANCE GADOBENATE DIMEGLUMINE 529 MG/ML IV SOLN

[Series 3: T1 · axial · 4.0mm · 0.35mm/px · z∈[-95,+36]mm · 5 of 30 slices shown (1 of 2)]
[im 1/30]
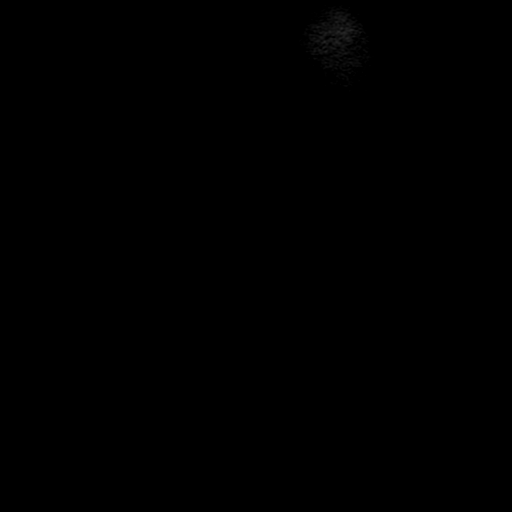
[im 8/30]
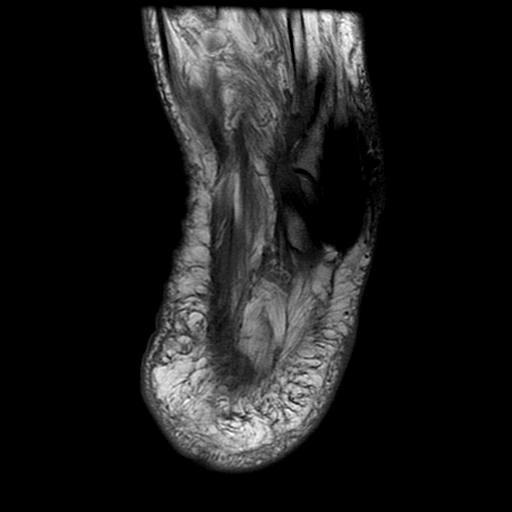
[im 15/30]
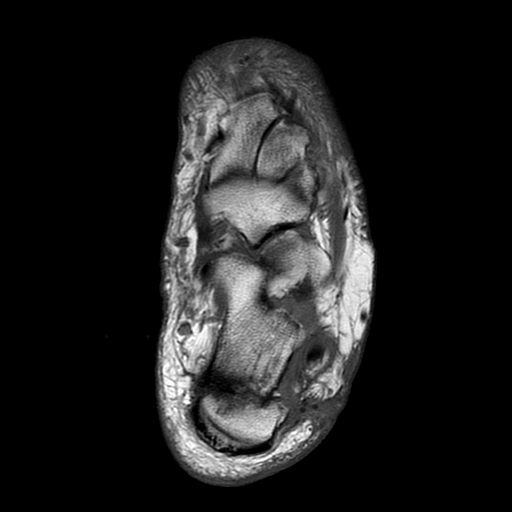
[im 22/30]
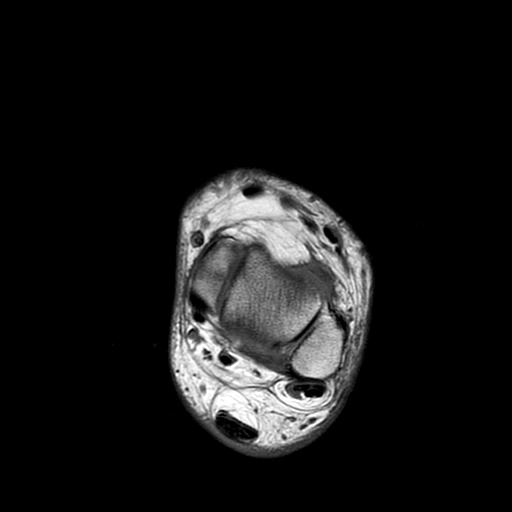
[im 30/30]
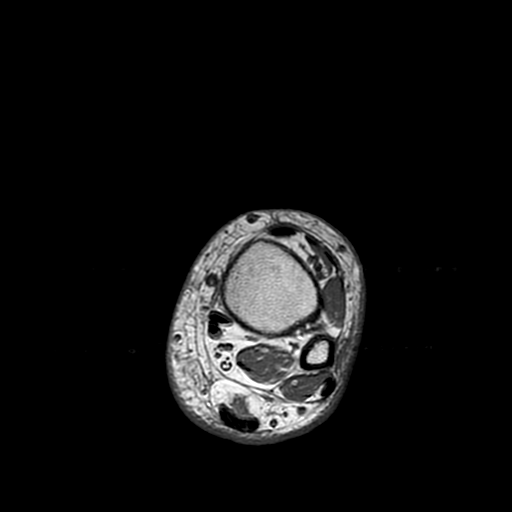

[Series 4: T1 fat-sat · axial · 4.0mm · 0.35mm/px · z∈[-95,+36]mm · 4 of 30 slices shown (1 of 2)]
[im 1/30]
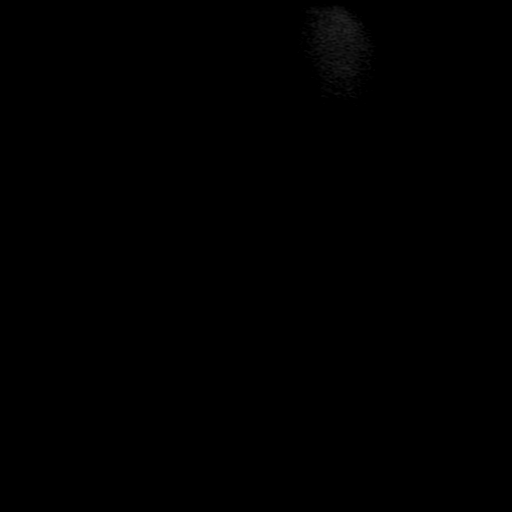
[im 10/30]
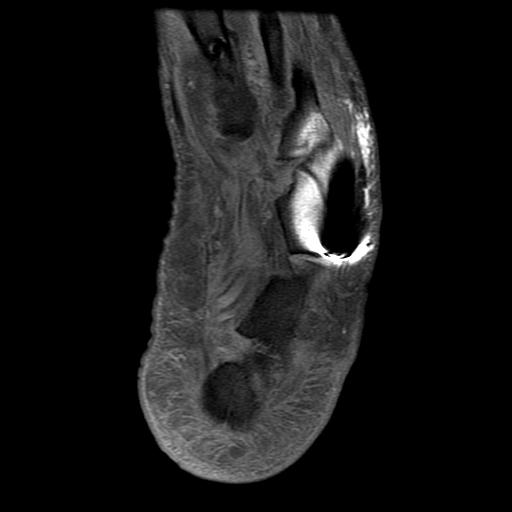
[im 20/30]
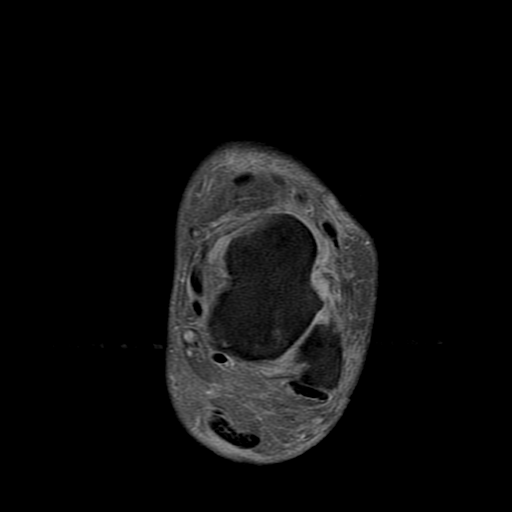
[im 30/30]
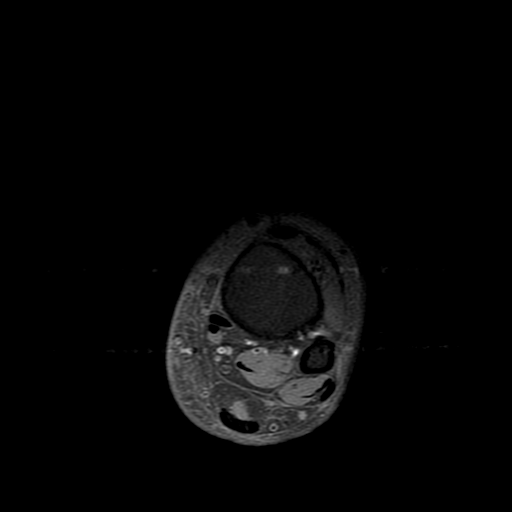

[Series 6: T1 · coronal · 4.0mm · 0.39mm/px · 5 of 38 slices shown (2 of 2)]
[im 1/38]
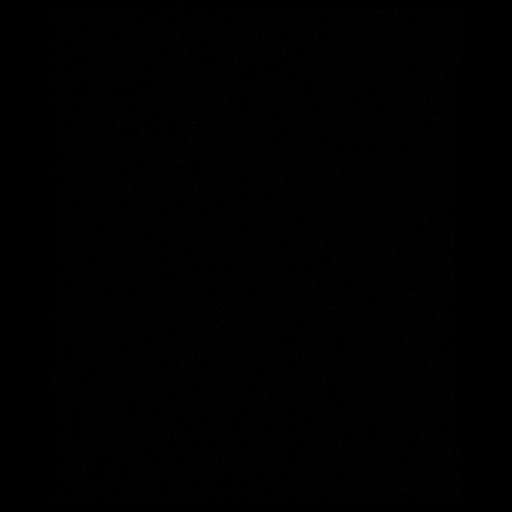
[im 10/38]
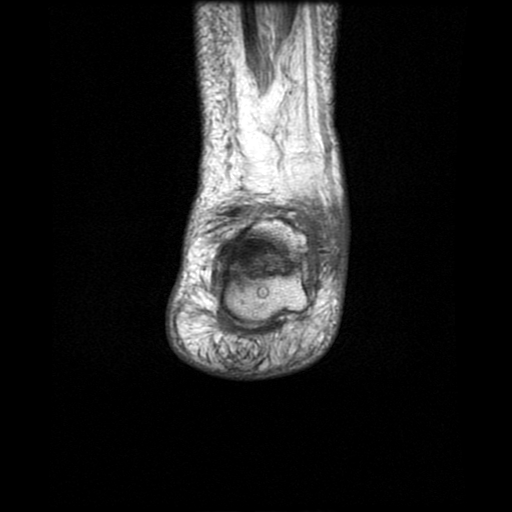
[im 19/38]
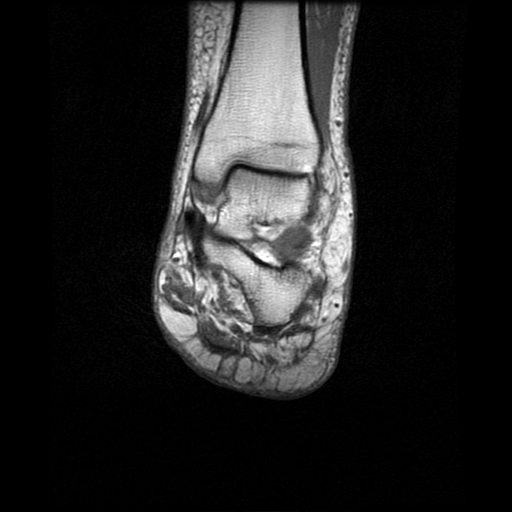
[im 28/38]
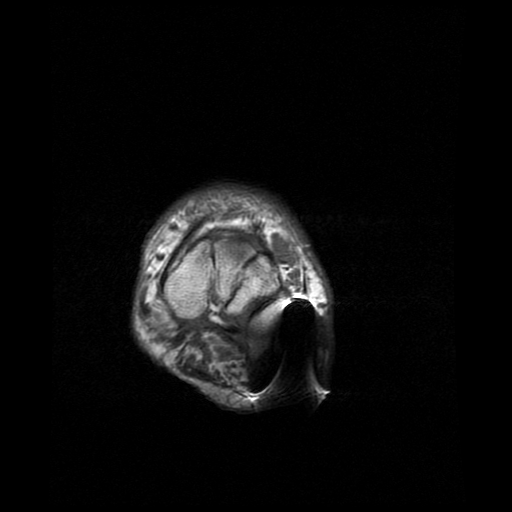
[im 38/38]
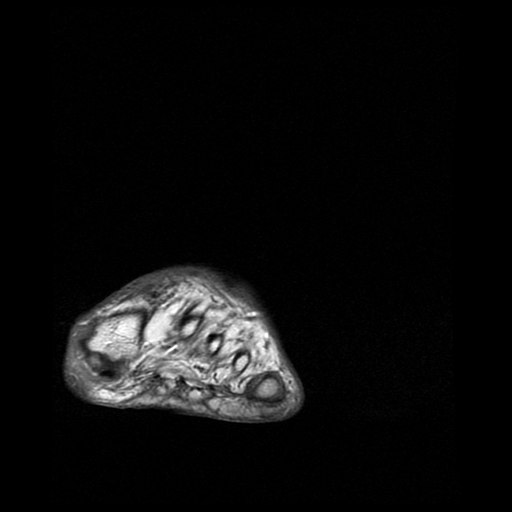

[Series 9: T1 fat-sat · axial · 4.0mm · 0.35mm/px · z∈[-95,+36]mm · 4 of 30 slices shown (2 of 2)]
[im 1/30]
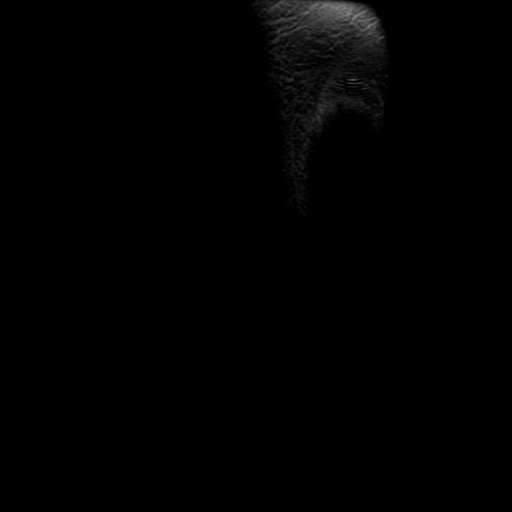
[im 10/30]
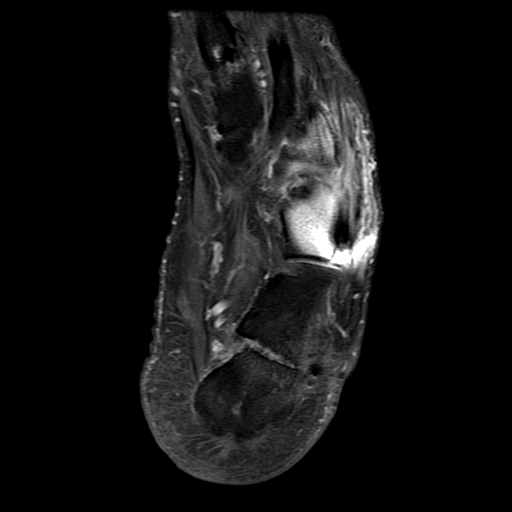
[im 20/30]
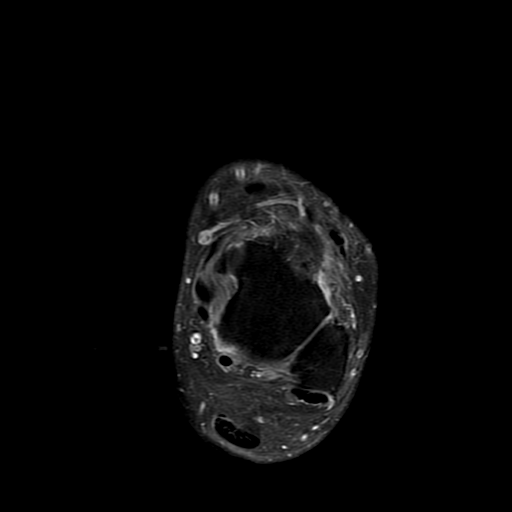
[im 30/30]
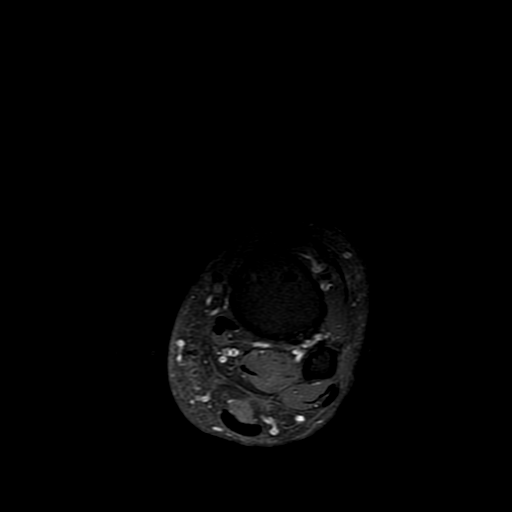

[18 of 40 positions shown; findings below may reference images not displayed]

FINDINGS: There is evidence of prior calcaneal osteotomy of the left posterior
calcaneus with hardware removal. There is a soft tissue wound along
the posterior lateral aspect of the left hindfoot extending to the
lateral osteotomy site of the calcaneus. There is soft tissue
enhancement and osseous enhancement with mild cortical irregularity
along the lateral aspect of the calcaneal osteotomy site. There is
no drainable fluid collection.

There is orthopedic hardware transfixing the base of the fifth
metatarsal with surrounding susceptibility artifact. There is
orthopedic hardware in the first metatarsal partially visualize.

There is moderate osteoarthritis of the second tarsometatarsal
joint. There is no acute fracture or dislocation.

The flexor, extensor and peroneal tendons are intact. There is
thickening of the anterior talofibular ligament consistent with
prior injury. The posterior talofibular ligament, anterior
tibiofibular ligament and posterior tibiofibular ligaments are
intact. The deltoid ligament is intact. The spring ligament is
intact. The Achilles tendon is intact.
IMPRESSION: 1. There is evidence of prior calcaneal osteotomy of the left
posterior calcaneus with hardware removal. There is a soft tissue
wound along the posterior lateral aspect of the left hindfoot
extending to the lateral osteotomy site of the calcaneus. There is
soft tissue enhancement and osseous enhancement with mild cortical
irregularity along the lateral aspect of the calcaneal osteotomy
site which may reflect postsurgical granulation tissue, but
osteomyelitis cannot be completely excluded. No drainable fluid
collection.

## 2016-04-15 ENCOUNTER — Ambulatory Visit: Payer: BLUE CROSS/BLUE SHIELD | Admitting: Internal Medicine

## 2016-04-19 DIAGNOSIS — H00011 Hordeolum externum right upper eyelid: Secondary | ICD-10-CM | POA: Diagnosis not present

## 2016-04-23 ENCOUNTER — Encounter: Payer: Self-pay | Admitting: Adult Health

## 2016-04-23 ENCOUNTER — Ambulatory Visit (INDEPENDENT_AMBULATORY_CARE_PROVIDER_SITE_OTHER): Payer: BLUE CROSS/BLUE SHIELD | Admitting: Adult Health

## 2016-04-23 VITALS — BP 140/58 | Temp 98.1°F | Ht 66.0 in | Wt 252.0 lb

## 2016-04-23 DIAGNOSIS — J340 Abscess, furuncle and carbuncle of nose: Secondary | ICD-10-CM

## 2016-04-23 MED ORDER — DOXYCYCLINE HYCLATE 100 MG PO CAPS: 100.0000 mg | ORAL_CAPSULE | Freq: Two times a day (BID) | ORAL | Status: DC

## 2016-04-23 NOTE — Progress Notes (Signed)
Subjective:    Patient ID: [name], female    DOB: Oct 27, 1960, 55 y.o.   MRN: [id number]  HPI  This is a 55 year old female, patient of Dr. Ansel Bong, who I am seeing for an acute issue today. In the reports that over the last 3 days she's noticed "bump" in the inside of her left nostril. This "bump" is painful to touch. She also reports a scant amount of discharge from this bump. She's been putting Neosporin on it nightly but does not feel as though this is working well  Review of Systems  HENT: Negative for nosebleeds and rhinorrhea.   Skin: Positive for wound.  Neurological: Negative.   All other systems reviewed and are negative.  Past Medical History  Diagnosis Date  . DIABETES MELLITUS, TYPE II 08/21/2006  . HYPERLIPIDEMIA 08/21/2006  . GOUT 08/20/2010  . OBESITY 06/04/2009  . ANEMIA-UNSPECIFIED 09/18/2009  . HYPERTENSION 08/21/2006  . GERD 08/21/2006  . Internal hemorrhoids   . Pulmonary sarcoidosis ([place])     Followed locally by pulmonology, but also by Dr. Casper Harrison at [facility]  . Exertional chest pain     sharp, substernal, exertional  . Vocal cord dysfunction   . Chronic diastolic heart failure, NYHA class 2 (HCC)     LVEDP roughly 20% by cath  . CHF (congestive heart failure) ([place])   . COPD (chronic obstructive pulmonary disease) ([place])   . Depression   . Fracture of 5th metatarsal     non union  . Abnormal SPEP 04/17/2014  . Osteomyelitis of left foot ([place]) 05/29/2015  . Diabetic osteomyelitis ([place]) 05/29/2015  . Hx of umbilical hernia repair   . Infection of wound due to methicillin resistant Staphylococcus aureus (MRSA)   . Wears partial dentures   . Onychomycosis 10/27/2015    Social History   Social History  . Marital Status: Married    Spouse Name: AASIYA ROLLINGS  . Number of Children: 2  . Years of Education: 12   Occupational History  . DISABLED    Social History Main Topics  . Smoking status: Never Smoker   . Smokeless  tobacco: Never Used  . Alcohol Use: No  . Drug Use: No  . Sexual Activity: Yes    Birth Control/ Protection: Surgical   Other Topics Concern  . Not on file   Social History Narrative   Married 1994. 2 sons who both live close and 1 grandson.       Disability due to sarcoidosis. 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 Ventral hernia repair    . Nissen fundoplication  [id number]  . Cholecystectomy  1984  . Abdominal hysterectomy    . Knee arthroscopy      right  . Tubal ligation      with reversal in 1994  . Bladder suspension  11/11/2011    Procedure: TRANSVAGINAL TAPE (TVT) PROCEDURE;  Surgeon: Olga Millers, MD;  Location: [facility];  Service: Gynecology;  Laterality: N/A;  . Cystoscopy  11/11/2011    Procedure: CYSTOSCOPY;  Surgeon: Olga Millers, MD;  Location: [facility];  Service: Gynecology;  Laterality: N/A;  . Doppler echocardiography  02/12/2013    LV FUNCTION, SIZE NORMAL; MILD CONCENTRIC LVH; EST EF 55-65%; WALL MOTION NORMAL  .  Lexiscan myoview  03/09/2013    EF 50%; NORMAL MYOCARDIAL PERFUSION STUDY - breast attenuation  . Cardiac catheterization  07/2010    LVEF 50-55% WITH VERY MILD GLOBAL HYPOKINESIA; ESSENTIALLY NORMAL CORONARY ARTERIES; NORMAL LV FUNCTION  . Carotids  02/18/11    CAROTID DUPLEX; VERTEBRALS ARE PATENT WITH ANTEGRADE FLOW. ICA/CCA RATIO 1.61 ON RIGHT AND 0.75 ON LEFT  . Right and left cardiac catheterization  04/23/2013    Angiographic normal coronaries; LVEDP 20 mmHg, PCWP 12-14 mmHg, RAP 12 mmHg.; Fick CO/CI 4.9/2.2  . Metatarsal osteotomy with open reduction internal fixation (orif) metatarsal with fusion Left 04/09/2014    Procedure: LEFT FOOT FRACTURE OPEN TREATMENT METATARSAL INCLUDES INTERNAL FIXATION EACH;  Surgeon: Lorn Junes, MD;  Location: [facility];  Service: Orthopedics;  Laterality: Left;  . Left and right heart catheterization with coronary angiogram N/A 04/23/2013    Procedure: LEFT AND RIGHT HEART CATHETERIZATION WITH CORONARY ANGIOGRAM;  Surgeon: Leonie Man, MD;  Location: [facility];  Service: Cardiovascular;  Laterality: N/A;  . Hernia repair    . Appendectomy    . Fracture surgery      foot  . I&d extremity Left 06/27/2015    Procedure: Partial Excision Left Calcaneus, Place Antibiotic Beads, and Wound VAC;  Surgeon: Newt Minion, MD;  Location: [facility];  Service: Orthopedics;  Laterality: Left;    Family History  Problem Relation Age of Onset  . Diabetes Father   . Heart attack Father   . Coronary artery disease Father   . Heart failure Father   . COPD Mother   . Emphysema Mother   . Asthma Mother   . Heart failure Mother   . Sarcoidosis Maternal Uncle   . Colon cancer Neg Hx   . Lung cancer Brother   . Diabetes Brother   . Heart attack Maternal Grandfather     Allergies  Allergen Reactions  . Methotrexate Other (See Comments)     peri-oral and buccal lesions.  . Vancomycin Other (See Comments)    nephrotoxicity  . Clindamycin/Lincomycin Nausea And Vomiting and Rash  . Chlorhexidine Itching  . Doxycycline Rash  . Lisinopril Cough  . Teflaro [Ceftaroline] Rash    Current Outpatient Prescriptions on File Prior to Visit  Medication Sig Dispense Refill  . acetaminophen (TYLENOL) 325 MG tablet Take 650 mg by mouth every 6 (six) hours as needed for headache. Reported on 01/05/2016    . albuterol (PROVENTIL HFA;VENTOLIN HFA) 108 (90 BASE) MCG/ACT inhaler Inhale 1-2 puffs into the lungs every 6 (six) hours as needed for wheezing or shortness of breath. 8 g 5  . albuterol (PROVENTIL) (2.5 MG/3ML) 0.083% nebulizer solution Take 3 mLs (2.5 mg total) by nebulization every 6 (six) hours as needed for wheezing or shortness of breath. 150 mL 6  . allopurinol (ZYLOPRIM) 100 MG tablet Take 100 mg by  mouth at bedtime.     [name] aspirin EC 81 MG tablet Take 81 mg by mouth daily.     [name] atorvastatin (LIPITOR) 40 MG tablet TAKE ONE TABLET BY MOUTH ONE TIME DAILY 90 tablet 3  . benzonatate (TESSALON) 100 MG capsule Take 100 mg by mouth 3 (three) times daily as needed for cough. Reported on 01/05/2016    . buPROPion (WELLBUTRIN XL) 300 MG 24 hr tablet Take 1 tablet (300 mg total) by mouth daily. 90 tablet 3  . carvedilol (COREG) 25 MG tablet Take 1 tablet (25 mg total) by mouth 2 (  two) times daily with a meal. 180 tablet 3  . chlorpheniramine-HYDROcodone (TUSSIONEX) 10-8 MG/5ML SUER Take 5 mLs by mouth daily as needed for cough. Reported on 01/05/2016  0  . cholecalciferol 5000 UNITS TABS Take 1 tablet (5,000 Units total) by mouth daily. (Patient taking differently: Take 5,000 Units by mouth every morning. ) 30 tablet 3  . dexlansoprazole (DEXILANT) 60 MG capsule Take 1 capsule (60 mg total) by mouth daily. 30 capsule 5  . diltiazem (CARDIZEM CD) 120 MG 24 hr capsule TAKE ONE CAPSULE BY MOUTH EVERY DAY 30 capsule 7  . diltiazem (CARTIA XT) 120 MG 24 hr capsule Take 1 capsule (120 mg total) by mouth daily. 30 capsule 1  . estradiol (ESTRACE) 2 MG tablet Take 2 mg by mouth at bedtime.     . fenofibrate (TRICOR) 145 MG tablet Take 1 tablet (145 mg total) by mouth daily. 30 tablet 10  . fluticasone (FLONASE) 50 MCG/ACT nasal spray USE TWO SPRAYS IN EACH NOSTRIL DAILY 16 g 2  . furosemide (LASIX) 40 MG tablet TAKE 1 TABLET (40 MG TOTAL) BY MOUTH 2 (TWO) TIMES DAILY. 60 tablet 7  . gabapentin (NEURONTIN) 100 MG capsule TAKE ONE CAPSULE BY MOUTH THREE TIMES DAILY 90 capsule 2  . glucose blood (ONE TOUCH ULTRA TEST) test strip 1 each by Other route 3 (three) times daily. Use as instructed 100 each 5  . HYDROcodone-acetaminophen (NORCO/VICODIN) 5-325 MG tablet 1 tablet by mouth 2-3 times a day as needed for pain  0  . insulin aspart (NOVOLOG FLEXPEN) 100 UNIT/ML FlexPen Inject 12-20 Units into the skin 3 (three)  times daily with meals. Per sliding scale 30 mL 5  . Insulin Pen Needle 33G X 4 MM MISC 1 each by Does not apply route 4 (four) times daily. 200 each 11  . losartan (COZAAR) 100 MG tablet Take 1 tablet (100 mg total) by mouth daily. 90 tablet 3  . metFORMIN (GLUCOPHAGE) 1000 MG tablet TAKE ONE TABLET BY MOUTH TWICE DAILY WITH MEALS 180 tablet 0  . mometasone-formoterol (DULERA) 200-5 MCG/ACT AERO Inhale 2 puffs into the lungs 2 (two) times daily. 13 g 5  . nitroGLYCERIN (NITROSTAT) 0.4 MG SL tablet Place 0.4 mg under the tongue every 5 (five) minutes as needed. Reported on 01/05/2016    . ondansetron (ZOFRAN-ODT) 4 MG disintegrating tablet Take 4 mg by mouth every 8 (eight) hours as needed for nausea or vomiting. Reported on 01/05/2016  0  . PARoxetine Mesylate (BRISDELLE) 7.5 MG CAPS Take 7.5 mg by mouth daily.    Vladimir Faster Glycol-Propyl Glycol (SYSTANE) 0.4-0.3 % SOLN Apply 1 drop to eye at bedtime.    . potassium chloride SA (K-DUR,KLOR-CON) 20 MEQ tablet Take 1 tablet (20 mEq total) by mouth 3 (three) times daily. 90 tablet 1  . PREDNISONE PO Take 4 mg by mouth daily.    [name] terbinafine (LAMISIL) 250 MG tablet Take 1 tablet (250 mg total) by mouth daily. 30 tablet 3  . TOUJEO SOLOSTAR 300 UNIT/ML SOPN INJECT 40 UNITS INTO THE SKIN AT BEDTIME 13.5 mL 1  . triamcinolone cream (KENALOG) 0.1 % Apply 1 application topically 2 (two) times daily. To abdomen rash for 10 days. 30 g 0  . venlafaxine XR (EFFEXOR-XR) 75 MG 24 hr capsule Take 2 capsules (150 mg total) by mouth at bedtime.    . vitamin B-12 (CYANOCOBALAMIN) 1000 MCG tablet Take 1,000 mcg by mouth daily.     No current facility-administered medications on  file prior to visit.    BP 140/58 mmHg  Temp(Src) 98.1 F (36.7 C) (Oral)  Ht 5\' 6"  (1.676 m)  Wt 252 lb (114.306 kg)  BMI 40.69 kg/m2       Objective:   Physical Exam  Constitutional: She is oriented to person, place, and time. She appears well-developed and well-nourished. No  distress.  HENT:  Nose: No epistaxis.  No foreign bodies.    Red, swollen, tender to touch abscess noted in the left nasal cavity  Neurological: She is alert and oriented to person, place, and time.  Skin: Skin is warm and dry. She is not diaphoretic.  Psychiatric: She has a normal mood and affect. Her behavior is normal. Judgment and thought content normal.  Nursing note and vitals reviewed.     Assessment & Plan:  1. Abscess of nasal cavity - Is in a sterile Q-tip I was able to remove the head of the abscess without difficulty. I was unable to express a small amount of pus from the abscess. I was able to break up other inoculations within the abscess in that pus was also expressed using pressure from the Q-tip.  - Patient felt relief after pus was expressed - No active bleeding noted. - doxycycline (VIBRAMYCIN) 100 MG capsule; Take 1 capsule (100 mg total) by mouth 2 (two) times daily.  Dispense: 20 capsule; Refill: 0 -Follow-up with PCP if no improvement  Dorothyann Peng, NP

## 2016-05-08 ENCOUNTER — Other Ambulatory Visit: Payer: Self-pay | Admitting: Family Medicine

## 2016-05-11 DIAGNOSIS — Z6841 Body Mass Index (BMI) 40.0 and over, adult: Secondary | ICD-10-CM | POA: Diagnosis not present

## 2016-05-11 DIAGNOSIS — R05 Cough: Secondary | ICD-10-CM | POA: Diagnosis not present

## 2016-05-11 DIAGNOSIS — D869 Sarcoidosis, unspecified: Secondary | ICD-10-CM | POA: Diagnosis not present

## 2016-05-11 DIAGNOSIS — D860 Sarcoidosis of lung: Secondary | ICD-10-CM | POA: Diagnosis not present

## 2016-05-17 ENCOUNTER — Other Ambulatory Visit: Payer: Self-pay | Admitting: Infectious Disease

## 2016-05-17 DIAGNOSIS — B351 Tinea unguium: Secondary | ICD-10-CM

## 2016-05-19 DIAGNOSIS — S91219A Laceration without foreign body of unspecified toe(s) with damage to nail, initial encounter: Secondary | ICD-10-CM | POA: Diagnosis not present

## 2016-05-19 DIAGNOSIS — S2020XA Contusion of thorax, unspecified, initial encounter: Secondary | ICD-10-CM | POA: Diagnosis not present

## 2016-05-24 ENCOUNTER — Ambulatory Visit: Payer: BLUE CROSS/BLUE SHIELD | Admitting: Pulmonary Disease

## 2016-05-26 ENCOUNTER — Ambulatory Visit: Payer: BLUE CROSS/BLUE SHIELD | Admitting: Infectious Disease

## 2016-05-31 ENCOUNTER — Other Ambulatory Visit: Payer: Self-pay | Admitting: Infectious Disease

## 2016-05-31 DIAGNOSIS — S93492A Sprain of other ligament of left ankle, initial encounter: Secondary | ICD-10-CM | POA: Diagnosis not present

## 2016-05-31 DIAGNOSIS — S93602A Unspecified sprain of left foot, initial encounter: Secondary | ICD-10-CM | POA: Diagnosis not present

## 2016-05-31 DIAGNOSIS — B351 Tinea unguium: Secondary | ICD-10-CM

## 2016-06-01 ENCOUNTER — Encounter: Payer: Self-pay | Admitting: Podiatry

## 2016-06-01 ENCOUNTER — Ambulatory Visit (INDEPENDENT_AMBULATORY_CARE_PROVIDER_SITE_OTHER): Payer: BLUE CROSS/BLUE SHIELD

## 2016-06-01 ENCOUNTER — Other Ambulatory Visit: Payer: Self-pay | Admitting: Internal Medicine

## 2016-06-01 ENCOUNTER — Ambulatory Visit (INDEPENDENT_AMBULATORY_CARE_PROVIDER_SITE_OTHER): Payer: BLUE CROSS/BLUE SHIELD | Admitting: Podiatry

## 2016-06-01 VITALS — BP 138/51 | HR 79 | Resp 16 | Ht 66.0 in | Wt 250.0 lb

## 2016-06-01 DIAGNOSIS — M79673 Pain in unspecified foot: Secondary | ICD-10-CM

## 2016-06-01 DIAGNOSIS — L600 Ingrowing nail: Secondary | ICD-10-CM | POA: Diagnosis not present

## 2016-06-01 DIAGNOSIS — B351 Tinea unguium: Secondary | ICD-10-CM

## 2016-06-01 DIAGNOSIS — M79676 Pain in unspecified toe(s): Secondary | ICD-10-CM

## 2016-06-01 DIAGNOSIS — E1142 Type 2 diabetes mellitus with diabetic polyneuropathy: Secondary | ICD-10-CM | POA: Diagnosis not present

## 2016-06-01 MED ORDER — NEOMYCIN-POLYMYXIN-HC 3.5-10000-1 OT SOLN: OTIC | 0 refills | Status: DC

## 2016-06-01 NOTE — Progress Notes (Signed)
   Subjective:    Patient ID: [name], female    DOB: 12-09-1960, 55 y.o.   MRN: [id number]  HPI: She presents today chief complaint of a painful toenail second digit of the left foot where she traumatized the toe as she misstepped just recently ripping the toenail away from the skin. She states that she is a diabetic her blood sugar has been running about 120 2 in the morning and a hemoglobin A1c of 7.5.    Review of Systems  All other systems reviewed and are negative.      Objective:   Physical Exam: Vital signs are stable she is alert and oriented 3 pulses are palpable. Neurologic sensorium is slightly diminished per Semmes-Weinstein monofilament. Deep tendon reflexes are non-elicitable muscle strength is normal bilateral. Orthopedic evaluation was resolved was distal to the ankle for range of motion or crepitation. Radial vas taken today 3 views demonstrate normal rectus foot without complications. No signs of infection of the bone. No fractures identified. Cutaneous evaluation demonstrates supple well-hydrated cutis she does have a slight laceration to the distal aspect of the toe which appears to be healing normally initially refer to the second digit of the left foot with a near avulsion of the toenail itself.        Assessment & Plan:  Partial nail avulsion second digit left with diabetic peripheral neuropathy.  Plan: Total nail avulsion with matrixectomy performed today after local anesthesia was administered she was provided with both oral and going instructions for care and soaking of her toe as well as a prescription for Cortisporin Otic to be applied twice daily after soaking. I will follow-up with her in 1 week.

## 2016-06-01 NOTE — Patient Instructions (Signed)
Betadine Soak Instructions  Purchase an 8 oz. bottle of BETADINE solution (Povidone)  THE DAY AFTER THE PROCEDURE  Place 1 tablespoon of betadine solution in a quart of warm tap water.  Submerge your foot or feet with outer bandage intact for the initial soak; this will allow the bandage to become moist and wet for easy lift off.  Once you remove your bandage, continue to soak in the solution for 20 minutes.  This soak should be done twice a day.  Next, remove your foot or feet from solution, blot dry the affected area and cover.  You may use a band aid large enough to cover the area or use gauze and tape.  Apply other medications to the area as directed by the doctor such as cortisporin otic solution (ear drops) or neosporin.  IF YOUR SKIN BECOMES IRRITATED WHILE USING THESE INSTRUCTIONS, IT IS OKAY TO SWITCH TO EPSOM SALTS AND WATER OR WHITE VINEGAR AND WATER. 

## 2016-06-03 ENCOUNTER — Ambulatory Visit: Payer: BLUE CROSS/BLUE SHIELD | Admitting: Family Medicine

## 2016-06-08 ENCOUNTER — Ambulatory Visit: Payer: BLUE CROSS/BLUE SHIELD | Admitting: Podiatry

## 2016-06-15 ENCOUNTER — Encounter: Payer: Self-pay | Admitting: Podiatry

## 2016-06-15 ENCOUNTER — Ambulatory Visit (INDEPENDENT_AMBULATORY_CARE_PROVIDER_SITE_OTHER): Payer: BLUE CROSS/BLUE SHIELD | Admitting: Podiatry

## 2016-06-15 DIAGNOSIS — L600 Ingrowing nail: Secondary | ICD-10-CM

## 2016-06-15 NOTE — Patient Instructions (Signed)
Epsom Salt Soak Instructions    START THIS 1 WEEK AFTER INITIAL PROCEDURE  Place 1/4 cup of epsom salts in a quart of warm tap water.  Soak your foot or feet in the solution for 20 minutes twice a day until you notice the area has dried and a scab has formed. Continue to apply other medications to the area as directed by the doctor such as polysporin, neosporin or cortisporin drops.  IF YOUR SKIN BECOMES IRRITATED WHILE USING THESE INSTRUCTIONS, IT IS OKAY TO SWITCH TO  WHITE VINEGAR AND WATER. Or you may use antibacterial soap and water to keep the toe clean  Monitor for any signs/symptoms of infection. Call the office immediately if any occur or go directly to the emergency room. Call with any questions/concerns.  

## 2016-06-15 NOTE — Progress Notes (Signed)
She presents today for follow-up of her nail avulsion hallux left. She states it appears to be doing pretty well.  Objective: Mild erythema and no edema saline drainage or erythema and erythema does not extend past the DIPJ. There is no purulence and no malodor appears to be healing well.  Assessment: Well-healed matrixectomy second digit left foot.  Plan: Discontinue Betadine sterile with Epsom salts and water soaks covered in the [employer] at bedtime.

## 2016-06-16 ENCOUNTER — Other Ambulatory Visit: Payer: Self-pay

## 2016-06-16 MED ORDER — GABAPENTIN 100 MG PO CAPS: 100.0000 mg | ORAL_CAPSULE | Freq: Three times a day (TID) | ORAL | 2 refills | Status: DC

## 2016-06-17 ENCOUNTER — Other Ambulatory Visit: Payer: Self-pay

## 2016-06-21 ENCOUNTER — Other Ambulatory Visit: Payer: Self-pay

## 2016-06-22 ENCOUNTER — Encounter: Payer: Self-pay | Admitting: Family Medicine

## 2016-06-22 ENCOUNTER — Ambulatory Visit (INDEPENDENT_AMBULATORY_CARE_PROVIDER_SITE_OTHER): Payer: BLUE CROSS/BLUE SHIELD | Admitting: Family Medicine

## 2016-06-22 VITALS — BP 126/64 | HR 79 | Temp 97.5°F | Wt 257.0 lb

## 2016-06-22 DIAGNOSIS — Z23 Encounter for immunization: Secondary | ICD-10-CM

## 2016-06-22 DIAGNOSIS — E785 Hyperlipidemia, unspecified: Secondary | ICD-10-CM

## 2016-06-22 DIAGNOSIS — I10 Essential (primary) hypertension: Secondary | ICD-10-CM

## 2016-06-22 DIAGNOSIS — R21 Rash and other nonspecific skin eruption: Secondary | ICD-10-CM

## 2016-06-22 DIAGNOSIS — I5032 Chronic diastolic (congestive) heart failure: Secondary | ICD-10-CM | POA: Diagnosis not present

## 2016-06-22 MED ORDER — DOXYCYCLINE HYCLATE 100 MG PO TABS: 100.0000 mg | ORAL_TABLET | Freq: Two times a day (BID) | ORAL | 0 refills | Status: DC

## 2016-06-22 NOTE — Assessment & Plan Note (Signed)
S: controlled on cartia XT 120mg , coreg 25mg  BID, losartan 50mg  BP Readings from Last 3 Encounters:  06/22/16 126/64  06/01/16 (!) 138/51  04/23/16 (!) 140/58  A/P:Continue current meds:  Doing well- will bump lasix for chf short term

## 2016-06-22 NOTE — Patient Instructions (Addendum)
Flu shot today  Call to get mammogram and endocrine visit updated   Check in about how her dad is doing next visit- diagnosed with throat cancer 2017  We will call you within a week about your referral to dermatology. If you do not hear within 2 weeks, give [place] a call.   Given antibiotic for spot in nose if does not improve- would try to do warm compresses on area though it is a hard spot to reach. You have an allergy of rash on doxycycline but you have allergies to most of antibiotics causing rash so it is a difficult spot but your last visit you seemed to toelrate this.   Lasix 40 twice a day-- increase to 80 in AM then 40 mg in evenign for 3 days- update me Friday by Deloris Ping how you are doing. You may repeat this at the beach if swelling increases and if doesn't respond to that seek care. Try to keep diet low in salt as much as able.   Flu shot given today

## 2016-06-22 NOTE — Progress Notes (Signed)
Pre visit review using our clinic review tool, if applicable. No additional management support is needed unless otherwise documented below in the visit note. 

## 2016-06-22 NOTE — Progress Notes (Addendum)
Subjective:  DESDEMONA HOLSOPPLE is a 55 y.o. year old very pleasant female patient who presents for/with See problem oriented charting ROS- mild cough, increase in shortness of breath mild, no chest pain, no fever, or chills, painful red bump inside left nare.see any ROS included in HPI as well.   Past Medical History-  Patient Active Problem List   Diagnosis Date Noted  . Obstructive chronic bronchitis without exacerbation ([place]) 09/18/2013    Priority: High  . Chest pain - at rest, and with exertion; -- angiographically normal coronary arteries, mildly elevated LVEDP 04/11/2013    Priority: High  . Chronic diastolic heart failure, NYHA class 2 ([place])     Priority: High  . Sarcoidosis of lung ([place]) 04/10/2007    Priority: High  . Type II diabetes mellitus with neurological manifestations, uncontrolled ([place]) 08/21/2006    Priority: High  . Fatty liver 09/30/2014    Priority: Medium  . Depression     Priority: Medium  . Gout 08/20/2010    Priority: Medium  . Anemia 09/18/2009    Priority: Medium  . Sleep apnea 04/21/2009    Priority: Medium  . Hyperlipidemia 08/21/2006    Priority: Medium  . Essential hypertension 08/21/2006    Priority: Medium  . Onychomycosis 10/27/2015    Priority: Low  . Hot flashes 07/15/2014    Priority: Low  . Abnormal SPEP 04/17/2014    Priority: Low  . Fracture of left leg 04/17/2014    Priority: Low  . Cushingoid side effect of steroids ([place]) 04/17/2014    Priority: Low  . Internal hemorrhoids     Priority: Low  . Preoperative clearance 03/25/2014    Priority: Low  . Other and unspecified coagulation defects 04/22/2013    Priority: Low  . Solitary pulmonary nodule, on CT 02/2013 - stable over 2 years in 2015 02/20/2013    Priority: Low  . Obesity 06/04/2009    Priority: Low  . GERD 08/21/2006    Priority: Low  . Osteomyelitis of left foot ([place]) 05/29/2015  . MRSA (methicillin resistant staph aureus) culture positive 03/27/2015  . Wound  infection complicating hardware ([place]) 03/27/2015    Medications- reviewed and updated Current Outpatient Prescriptions  Medication Sig Dispense Refill  . acetaminophen (TYLENOL) 325 MG tablet Take 650 mg by mouth every 6 (six) hours as needed for headache. Reported on 01/05/2016    . albuterol (PROVENTIL HFA;VENTOLIN HFA) 108 (90 BASE) MCG/ACT inhaler Inhale 1-2 puffs into the lungs every 6 (six) hours as needed for wheezing or shortness of breath. 8 g 5  . albuterol (PROVENTIL) (2.5 MG/3ML) 0.083% nebulizer solution Take 3 mLs (2.5 mg total) by nebulization every 6 (six) hours as needed for wheezing or shortness of breath. 150 mL 6  . allopurinol (ZYLOPRIM) 100 MG tablet Take 100 mg by mouth at bedtime.     [name] aspirin EC 81 MG tablet Take 81 mg by mouth daily.     [name] atorvastatin (LIPITOR) 40 MG tablet TAKE 1 TAB BY MOUTH ONCE DAILY 90 tablet 3  . benzonatate (TESSALON) 100 MG capsule Take 100 mg by mouth 3 (three) times daily as needed for cough. Reported on 01/05/2016    . buPROPion (WELLBUTRIN XL) 300 MG 24 hr tablet Take 1 tablet (300 mg total) by mouth daily. 90 tablet 3  . carvedilol (COREG) 25 MG tablet Take 1 tablet (25 mg total) by mouth 2 (two) times daily with a meal. 180 tablet 3  . chlorpheniramine-HYDROcodone (TUSSIONEX) 10-8  MG/5ML SUER Take 5 mLs by mouth daily as needed for cough. Reported on 01/05/2016  0  . cholecalciferol 5000 UNITS TABS Take 1 tablet (5,000 Units total) by mouth daily. (Patient taking differently: Take 5,000 Units by mouth every morning. ) 30 tablet 3  . dexlansoprazole (DEXILANT) 60 MG capsule Take 1 capsule (60 mg total) by mouth daily. 30 capsule 5  . diltiazem (CARTIA XT) 120 MG 24 hr capsule Take 1 capsule (120 mg total) by mouth daily. 30 capsule 1  . estradiol (ESTRACE) 2 MG tablet Take 2 mg by mouth at bedtime.     . fenofibrate (TRICOR) 145 MG tablet Take 1 tablet (145 mg total) by mouth daily. 30 tablet 10  . fluticasone (FLONASE) 50 MCG/ACT nasal  spray USE TWO SPRAYS IN EACH NOSTRIL DAILY 16 g 2  . furosemide (LASIX) 40 MG tablet TAKE 1 TABLET (40 MG TOTAL) BY MOUTH 2 (TWO) TIMES DAILY. 60 tablet 7  . gabapentin (NEURONTIN) 100 MG capsule Take 1 capsule (100 mg total) by mouth 3 (three) times daily. 90 capsule 2  . glucose blood (ONE TOUCH ULTRA TEST) test strip 1 each by Other route 3 (three) times daily. Use as instructed 100 each 5  . HYDROcodone-acetaminophen (NORCO/VICODIN) 5-325 MG tablet 1 tablet by mouth 2-3 times a day as needed for pain  0  . insulin aspart (NOVOLOG FLEXPEN) 100 UNIT/ML FlexPen Inject 12-20 Units into the skin 3 (three) times daily with meals. Per sliding scale 30 mL 5  . Insulin Pen Needle 33G X 4 MM MISC 1 each by Does not apply route 4 (four) times daily. 200 each 11  . losartan (COZAAR) 100 MG tablet Take 1 tablet (100 mg total) by mouth daily. 90 tablet 3  . metFORMIN (GLUCOPHAGE) 1000 MG tablet TAKE ONE TABLET BY MOUTH TWICE DAILY WITH MEALS 180 tablet 0  . mometasone-formoterol (DULERA) 200-5 MCG/ACT AERO Inhale 2 puffs into the lungs 2 (two) times daily. 13 g 5  . neomycin-polymyxin-hydrocortisone (CORTISPORIN) otic solution Apply one to two drops to toe after soaking twice daily. 10 mL 0  . nitroGLYCERIN (NITROSTAT) 0.4 MG SL tablet Place 0.4 mg under the tongue every 5 (five) minutes as needed. Reported on 01/05/2016    . ondansetron (ZOFRAN-ODT) 4 MG disintegrating tablet Take 4 mg by mouth every 8 (eight) hours as needed for nausea or vomiting. Reported on 01/05/2016  0  . PARoxetine Mesylate (BRISDELLE) 7.5 MG CAPS Take 7.5 mg by mouth daily.    Vladimir Faster Glycol-Propyl Glycol (SYSTANE) 0.4-0.3 % SOLN Apply 1 drop to eye at bedtime.    . potassium chloride SA (K-DUR,KLOR-CON) 20 MEQ tablet Take 1 tablet (20 mEq total) by mouth 3 (three) times daily. 90 tablet 1  . PREDNISONE PO Take 4 mg by mouth daily.    [name] terbinafine (LAMISIL) 250 MG tablet Take 1 tablet (250 mg total) by mouth daily. 30 tablet 3  .  TOUJEO SOLOSTAR 300 UNIT/ML SOPN INJECT 40 UNITS INTO THE SKIN AT BEDTIME 13.5 mL 1  . triamcinolone cream (KENALOG) 0.1 % Apply 1 application topically 2 (two) times daily. To abdomen rash for 10 days. 30 g 0  . venlafaxine XR (EFFEXOR-XR) 75 MG 24 hr capsule Take 2 capsules (150 mg total) by mouth at bedtime.    . vitamin B-12 (CYANOCOBALAMIN) 1000 MCG tablet Take 1,000 mcg by mouth daily.     No current facility-administered medications for this visit.     Objective: BP 126/64 (  BP Location: Left Arm, Patient Position: Sitting, Cuff Size: Large)   Pulse 79   Temp 97.5 F (36.4 C) (Oral)   Wt 257 lb (116.6 kg)   SpO2 98%   BMI 41.48 kg/m  Gen: NAD, resting comfortably, occasional mild cough Just inside left nare there is midl erythema with mild warmth and some swelling- no clear head and not fluctuant CV: RRR no murmurs rubs or gallops Lungs: CTAB no crackles, wheeze, rhonchi Abdomen: soft/nontender/nondistended/normal bowel sounds. No rebound or guarding.  Ext: 1+ edema Skin: warm, dry Neuro: grossly normal, moves all extremities  Diabetic Foot Exam - Simple   Simple Foot Form Visual Inspection No deformities, no ulcerations, no other skin breakdown bilaterally:  Yes Sensation Testing See comments:  Yes Pulse Check Posterior Tibialis and Dorsalis pulse intact bilaterally:  Yes Comments No monofilament sensation up to knee      Assessment/Plan:  Hyperlipidemia S: mild poorly controlled (trig over 200) on atorvastatin 40mg , tricor. No myalgias.  Lab Results  Component Value Date   CHOL 171 01/15/2016   HDL 33.30 (L) 01/15/2016   LDLCALC UNABLE TO CALCULATE IF TRIGLYCERIDE OVER 400 mg/dL 05/28/2011   LDLDIRECT 76.0 01/15/2016   TRIG (H) 01/15/2016    446.0 Triglyceride is over 400; calculations on Lipids are invalid.   CHOLHDL 5 01/15/2016   A/P: will continue current dose of meds- at least triglycerides under 500 and total under 200 with direct LDL under 100.    Essential hypertension S: controlled on cartia XT 120mg , coreg 25mg  BID, losartan 50mg  BP Readings from Last 3 Encounters:  06/22/16 126/64  06/01/16 (!) 138/51  04/23/16 (!) 140/58  A/P:Continue current meds:  Doing well- will bump lasix for chf short term  Chronic diastolic heart failure, NYHA class 2 (HCC) S: over last 1-2 weeks has noted some increased edema and mild increase in shortness of breath. Weight up 7 lbs from a few weeks ago.  A/P:Lasix 40 BID-- increase to 80 then 40 for 3 days- update me friday  Painful bump in nose S: patient treated recently for a painful abscess in her nose with doxycycline after head was removed with aid of q-tip. Today has small red painful area in a different spot in her nose but there is no clear ehad to it. Not as severe as last time A/P: given antibiotic script and if symptoms worsen she will take this  Rash S: for a few weeks has had multiple small scabbed over areas on lower back and onto buttocks. Not spreading. Very itchy. Tried triamcinolone cream given previously but did not work for her.  A/P: unclear etiology and failed steroid- refer to dermatology for further evaluation. Drug rash is possible but her regimen is extensive and would be difficult to know what to carve out  Return in about 3 months (around 09/21/2016) for follow up- or sooner if needed.  Orders Placed This Encounter  Procedures  . Flu Vaccine QUAD 36+ mos IM  . Ambulatory referral to Dermatology    Referral Priority:   Routine    Referral Type:   Consultation    Referral Reason:   Specialty Services Required    Requested Specialty:   Dermatology    Number of Visits Requested:   1   Meds ordered this encounter  Medications  . doxycycline (VIBRA-TABS) 100 MG tablet    Sig: Take 1 tablet (100 mg total) by mouth 2 (two) times daily.    Dispense:  14 tablet  Refill:  0   Return precautions advised.  Garret Reddish, MD

## 2016-06-22 NOTE — Assessment & Plan Note (Signed)
S: over last 1-2 weeks has noted some increased edema and mild increase in shortness of breath. Weight up 7 lbs from a few weeks ago.  A/P:Lasix 40 BID-- increase to 80 then 40 for 3 days- update me friday

## 2016-06-22 NOTE — Assessment & Plan Note (Signed)
S: mild poorly controlled (trig over 200) on atorvastatin 40mg , tricor. No myalgias.  Lab Results  Component Value Date   CHOL 171 01/15/2016   HDL 33.30 (L) 01/15/2016   LDLCALC UNABLE TO CALCULATE IF TRIGLYCERIDE OVER 400 mg/dL 05/28/2011   LDLDIRECT 76.0 01/15/2016   TRIG (H) 01/15/2016    446.0 Triglyceride is over 400; calculations on Lipids are invalid.   CHOLHDL 5 01/15/2016   A/P: will continue current dose of meds- at least triglycerides under 500 and total under 200 with direct LDL under 100.

## 2016-06-27 ENCOUNTER — Other Ambulatory Visit: Payer: Self-pay | Admitting: Internal Medicine

## 2016-07-07 ENCOUNTER — Ambulatory Visit (INDEPENDENT_AMBULATORY_CARE_PROVIDER_SITE_OTHER): Payer: BLUE CROSS/BLUE SHIELD | Admitting: Adult Health

## 2016-07-07 VITALS — BP 146/52 | Temp 98.2°F | Ht 72.0 in | Wt 256.8 lb

## 2016-07-07 DIAGNOSIS — R05 Cough: Secondary | ICD-10-CM

## 2016-07-07 DIAGNOSIS — J209 Acute bronchitis, unspecified: Secondary | ICD-10-CM

## 2016-07-07 DIAGNOSIS — R059 Cough, unspecified: Secondary | ICD-10-CM

## 2016-07-07 MED ORDER — PREDNISONE 20 MG PO TABS: ORAL_TABLET | ORAL | 0 refills | Status: DC

## 2016-07-07 NOTE — Progress Notes (Signed)
Subjective:    Patient ID: [name], female    DOB: [birth year], 55 y.o.   MRN: [id number]  URI   This is a new problem. The current episode started in the past 7 days (5 days ago ). The problem has been unchanged. Associated symptoms include congestion (chest ), coughing (dry hacking), rhinorrhea (clear ) and a sore throat. Pertinent negatives include no ear pain, headaches, sinus pain or sneezing. She has tried decongestant for the symptoms. The treatment provided no relief.   She has a history of Sarcoidosis and is taking 4 mg prednisone daily.     Review of Systems  HENT: Positive for congestion (chest ), rhinorrhea (clear ) and sore throat. Negative for ear pain and sneezing.   Respiratory: Positive for cough (dry hacking).   Neurological: Negative for headaches.   Past Medical History:  Diagnosis Date  . Abnormal SPEP 04/17/2014  . ANEMIA-UNSPECIFIED 09/18/2009  . CHF (congestive heart failure) ([place])   . Chronic diastolic heart failure, NYHA class 2 (HCC)    LVEDP roughly 20% by cath  . COPD (chronic obstructive pulmonary disease) ([place])   . Depression   . DIABETES MELLITUS, TYPE II 08/21/2006  . Diabetic osteomyelitis ([place]) 05/29/2015  . Exertional chest pain    sharp, substernal, exertional  . Fracture of 5th metatarsal    non union  . GERD 08/21/2006  . GOUT 08/20/2010  . Hx of umbilical hernia repair   . HYPERLIPIDEMIA 08/21/2006  . HYPERTENSION 08/21/2006  . Infection of wound due to methicillin resistant Staphylococcus aureus (MRSA)   . Internal hemorrhoids   . OBESITY 06/04/2009  . Onychomycosis 10/27/2015  . Osteomyelitis of left foot ([place]) 05/29/2015  . Pulmonary sarcoidosis ([place])    Followed locally by pulmonology, but also by Dr. Casper Harrison at [facility]  . Vocal cord dysfunction   . Wears partial dentures     Social History   Social History  . Marital status: Married    Spouse name: SHARMAIN AMOAH  . Number of children: 2  . Years of  education: 12   Occupational History  . DISABLED    Social History Main Topics  . Smoking status: Never Smoker  . Smokeless tobacco: Never Used  . Alcohol use No  . Drug use: No  . Sexual activity: Yes    Birth control/ protection: Surgical   Other Topics Concern  . Not on file   Social History Narrative   Married 1994. 2 sons who both live close and 1 grandson.       Disability due to sarcoidosis. 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 ABDOMINAL HYSTERECTOMY    . APPENDECTOMY    . BLADDER SUSPENSION  11/11/2011   Procedure: TRANSVAGINAL TAPE (TVT) PROCEDURE;  Surgeon: Olga Millers, MD;  Location: [facility];  Service: Gynecology;  Laterality: N/A;  . CARDIAC CATHETERIZATION  07/2010   LVEF 50-55% WITH VERY MILD GLOBAL HYPOKINESIA; ESSENTIALLY NORMAL CORONARY ARTERIES; NORMAL LV FUNCTION  . CAROTIDS  02/18/11   CAROTID DUPLEX; VERTEBRALS ARE PATENT WITH ANTEGRADE FLOW. ICA/CCA RATIO 1.61 ON RIGHT AND 0.75 ON LEFT  . CHOLECYSTECTOMY  1984  . CYSTOSCOPY  11/11/2011   Procedure: CYSTOSCOPY;  Surgeon: Olga Millers, MD;  Location: [facility];  Service:  Gynecology;  Laterality: N/A;  . DOPPLER ECHOCARDIOGRAPHY  02/12/2013   LV FUNCTION, SIZE NORMAL; MILD CONCENTRIC LVH; EST EF 55-65%; WALL MOTION NORMAL  . FRACTURE SURGERY     foot  . HERNIA REPAIR    . I&D EXTREMITY Left 06/27/2015   Procedure: Partial Excision Left Calcaneus, Place Antibiotic Beads, and Wound VAC;  Surgeon: Newt Minion, MD;  Location: [facility];  Service: Orthopedics;  Laterality: Left;  . KNEE ARTHROSCOPY     right  . LEFT AND RIGHT HEART CATHETERIZATION WITH CORONARY ANGIOGRAM N/A 04/23/2013   Procedure: LEFT AND RIGHT HEART CATHETERIZATION WITH CORONARY ANGIOGRAM;  Surgeon: Leonie Man, MD;  Location: [facility];   Service: Cardiovascular;  Laterality: N/A;  . LexiScan Myoview  03/09/2013   EF 50%; NORMAL MYOCARDIAL PERFUSION STUDY - breast attenuation  . METATARSAL OSTEOTOMY WITH OPEN REDUCTION INTERNAL FIXATION (ORIF) METATARSAL WITH FUSION Left 04/09/2014   Procedure: LEFT FOOT FRACTURE OPEN TREATMENT METATARSAL INCLUDES INTERNAL FIXATION EACH;  Surgeon: Lorn Junes, MD;  Location: [facility];  Service: Orthopedics;  Laterality: Left;  . NISSEN FUNDOPLICATION  [id number]  . Right and left CARDIAC CATHETERIZATION  04/23/2013   Angiographic normal coronaries; LVEDP 20 mmHg, PCWP 12-14 mmHg, RAP 12 mmHg.; [place] CO/CI 4.9/2.2  . TUBAL LIGATION     with reversal in 1994  . VENTRAL HERNIA REPAIR      Family History  Problem Relation Age of Onset  . Diabetes Father   . Heart attack Father   . Coronary artery disease Father   . Heart failure Father   . COPD Mother   . Emphysema Mother   . Asthma Mother   . Heart failure Mother   . Heart attack Maternal Grandfather   . Sarcoidosis Maternal Uncle   . Lung cancer Brother   . Diabetes Brother   . Colon cancer Neg Hx     Allergies  Allergen Reactions  . Methotrexate Other (See Comments)     peri-oral and buccal lesions.  . Vancomycin Other (See Comments)    nephrotoxicity  . Clindamycin/Lincomycin Nausea And Vomiting and Rash  . Chlorhexidine Itching  . Doxycycline Rash  . Lisinopril Cough  . Teflaro [Ceftaroline] Rash    Current Outpatient Prescriptions on File Prior to Visit  Medication Sig Dispense Refill  . acetaminophen (TYLENOL) 325 MG tablet Take 650 mg by mouth every 6 (six) hours as needed for headache. Reported on 01/05/2016    . albuterol (PROVENTIL HFA;VENTOLIN HFA) 108 (90 BASE) MCG/ACT inhaler Inhale 1-2 puffs into the lungs every 6 (six) hours as needed for wheezing or shortness of breath. 8 g 5  . albuterol (PROVENTIL) (2.5 MG/3ML) 0.083% nebulizer solution Take 3 mLs (2.5 mg total) by nebulization every 6 (six)  hours as needed for wheezing or shortness of breath. 150 mL 6  . allopurinol (ZYLOPRIM) 100 MG tablet Take 100 mg by mouth at bedtime.     [name] aspirin EC 81 MG tablet Take 81 mg by mouth daily.     [name] atorvastatin (LIPITOR) 40 MG tablet TAKE 1 TAB BY MOUTH ONCE DAILY 90 tablet 3  . benzonatate (TESSALON) 100 MG capsule Take 100 mg by mouth 3 (three) times daily as needed for cough. Reported on 01/05/2016    . buPROPion (WELLBUTRIN XL) 300 MG 24 hr tablet Take 1 tablet (300 mg total) by mouth daily. 90 tablet 3  . carvedilol (COREG) 25 MG tablet Take 1 tablet (25 mg total) by mouth  2 (two) times daily with a meal. 180 tablet 3  . chlorpheniramine-HYDROcodone (TUSSIONEX) 10-8 MG/5ML SUER Take 5 mLs by mouth daily as needed for cough. Reported on 01/05/2016  0  . cholecalciferol 5000 UNITS TABS Take 1 tablet (5,000 Units total) by mouth daily. (Patient taking differently: Take 5,000 Units by mouth every morning. ) 30 tablet 3  . dexlansoprazole (DEXILANT) 60 MG capsule Take 1 capsule (60 mg total) by mouth daily. 30 capsule 5  . diltiazem (CARTIA XT) 120 MG 24 hr capsule Take 1 capsule (120 mg total) by mouth daily. 30 capsule 1  . doxycycline (VIBRA-TABS) 100 MG tablet Take 1 tablet (100 mg total) by mouth 2 (two) times daily. 14 tablet 0  . estradiol (ESTRACE) 2 MG tablet Take 2 mg by mouth at bedtime.     . fenofibrate (TRICOR) 145 MG tablet Take 1 tablet (145 mg total) by mouth daily. 30 tablet 10  . fluticasone (FLONASE) 50 MCG/ACT nasal spray USE TWO SPRAYS IN EACH NOSTRIL DAILY 16 g 2  . furosemide (LASIX) 40 MG tablet TAKE 1 TABLET (40 MG TOTAL) BY MOUTH 2 (TWO) TIMES DAILY. 60 tablet 7  . gabapentin (NEURONTIN) 100 MG capsule Take 1 capsule (100 mg total) by mouth 3 (three) times daily. 90 capsule 2  . glucose blood (ONE TOUCH ULTRA TEST) test strip 1 each by Other route 3 (three) times daily. Use as instructed 100 each 5  . HYDROcodone-acetaminophen (NORCO/VICODIN) 5-325 MG tablet 1 tablet by  mouth 2-3 times a day as needed for pain  0  . insulin aspart (NOVOLOG FLEXPEN) 100 UNIT/ML FlexPen Inject 12-20 Units into the skin 3 (three) times daily with meals. Per sliding scale 30 mL 5  . Insulin Pen Needle 33G X 4 MM MISC 1 each by Does not apply route 4 (four) times daily. 200 each 11  . losartan (COZAAR) 100 MG tablet Take 1 tablet (100 mg total) by mouth daily. 90 tablet 3  . metFORMIN (GLUCOPHAGE) 1000 MG tablet TAKE ONE TABLET BY MOUTH TWICE DAILY WITH MEALS 180 tablet 0  . mometasone-formoterol (DULERA) 200-5 MCG/ACT AERO Inhale 2 puffs into the lungs 2 (two) times daily. 13 g 5  . neomycin-polymyxin-hydrocortisone (CORTISPORIN) otic solution Apply one to two drops to toe after soaking twice daily. 10 mL 0  . nitroGLYCERIN (NITROSTAT) 0.4 MG SL tablet Place 0.4 mg under the tongue every 5 (five) minutes as needed. Reported on 01/05/2016    . ondansetron (ZOFRAN-ODT) 4 MG disintegrating tablet Take 4 mg by mouth every 8 (eight) hours as needed for nausea or vomiting. Reported on 01/05/2016  0  . PARoxetine Mesylate (BRISDELLE) 7.5 MG CAPS Take 7.5 mg by mouth daily.    Vladimir Faster Glycol-Propyl Glycol (SYSTANE) 0.4-0.3 % SOLN Apply 1 drop to eye at bedtime.    . potassium chloride SA (K-DUR,KLOR-CON) 20 MEQ tablet Take 1 tablet (20 mEq total) by mouth 3 (three) times daily. 90 tablet 1  . PREDNISONE PO Take 4 mg by mouth daily.    [name] terbinafine (LAMISIL) 250 MG tablet Take 1 tablet (250 mg total) by mouth daily. 30 tablet 3  . TOUJEO SOLOSTAR 300 UNIT/ML SOPN INJECT 40 UNITS INTO THE SKIN AT BEDTIME 13.5 mL 1  . triamcinolone cream (KENALOG) 0.1 % Apply 1 application topically 2 (two) times daily. To abdomen rash for 10 days. 30 g 0  . venlafaxine XR (EFFEXOR-XR) 75 MG 24 hr capsule Take 2 capsules (150 mg total) by mouth  at bedtime.    . vitamin B-12 (CYANOCOBALAMIN) 1000 MCG tablet Take 1,000 mcg by mouth daily.     No current facility-administered medications on file prior to visit.      BP (!) 146/52   Temp 98.2 F (36.8 C) (Oral)   Ht 6' (1.829 m)   Wt 256 lb 12.8 oz (116.5 kg)   BMI 34.83 kg/m       Objective:   Physical Exam  Constitutional: She is oriented to person, place, and time. She appears well-developed and well-nourished. No distress.  HENT:  Mouth/Throat: Uvula is midline and oropharynx is clear and moist.  Neck: Normal range of motion. Neck supple.  Cardiovascular: Normal rate, regular rhythm, normal heart sounds and intact distal pulses.  Exam reveals no gallop and no friction rub.   No murmur heard. Pulmonary/Chest: Effort normal and breath sounds normal. No respiratory distress. She has no wheezes. She has no rales. She exhibits no tenderness.  Lymphadenopathy:    She has no cervical adenopathy.  Neurological: She is alert and oriented to person, place, and time.  Skin: Skin is warm and dry. No rash noted. No erythema. No pallor.  Psychiatric: She has a normal mood and affect. Her behavior is normal. Judgment and thought content normal.  Nursing note and vitals reviewed.      Assessment & Plan:   1. Cough - likely sarcoidosis flare.  - predniSONE (DELTASONE) 20 MG tablet; 60mg  PO qam x 3 days, 40mg  po qam x 3 days, 20mg  qam x 3 days  Dispense: 18 tablet; Refill: 0 - Drink with plenty of water to keep blood sugars down  - Follow up if no improvement  Dorothyann Peng, NP

## 2016-07-07 NOTE — Patient Instructions (Signed)
I have sent in prednisone. Take as directed  60mg  PO qam x 3 days, 40mg  po qam x 3 days, 20mg  qam x 3 days  This is going to increase your blood sugars. Make sure you drink a lot of water while taking this  Follow up if no improvement

## 2016-07-13 ENCOUNTER — Other Ambulatory Visit: Payer: Self-pay

## 2016-07-13 MED ORDER — FLUTICASONE PROPIONATE 50 MCG/ACT NA SUSP: 2.0000 | Freq: Every day | NASAL | 2 refills | Status: DC

## 2016-07-14 ENCOUNTER — Other Ambulatory Visit: Payer: Self-pay

## 2016-07-14 MED ORDER — FLUTICASONE PROPIONATE 50 MCG/ACT NA SUSP: 2.0000 | Freq: Every day | NASAL | 0 refills | Status: DC

## 2016-07-16 ENCOUNTER — Other Ambulatory Visit: Payer: Self-pay | Admitting: Family Medicine

## 2016-07-24 ENCOUNTER — Other Ambulatory Visit: Payer: Self-pay | Admitting: Cardiology

## 2016-07-27 ENCOUNTER — Other Ambulatory Visit: Payer: Self-pay | Admitting: Internal Medicine

## 2016-07-31 ENCOUNTER — Other Ambulatory Visit: Payer: Self-pay | Admitting: Internal Medicine

## 2016-08-22 IMAGING — CR DG CHEST 2V
2 series · 2 of 2 positions shown · non-contrast
Comparison: Chest radiograph performed 05/07/2015

CLINICAL DATA: Acute onset of shortness of breath and generalized
chest pain. Initial encounter.

EXAM:
CHEST  2 VIEW

[chest pa]
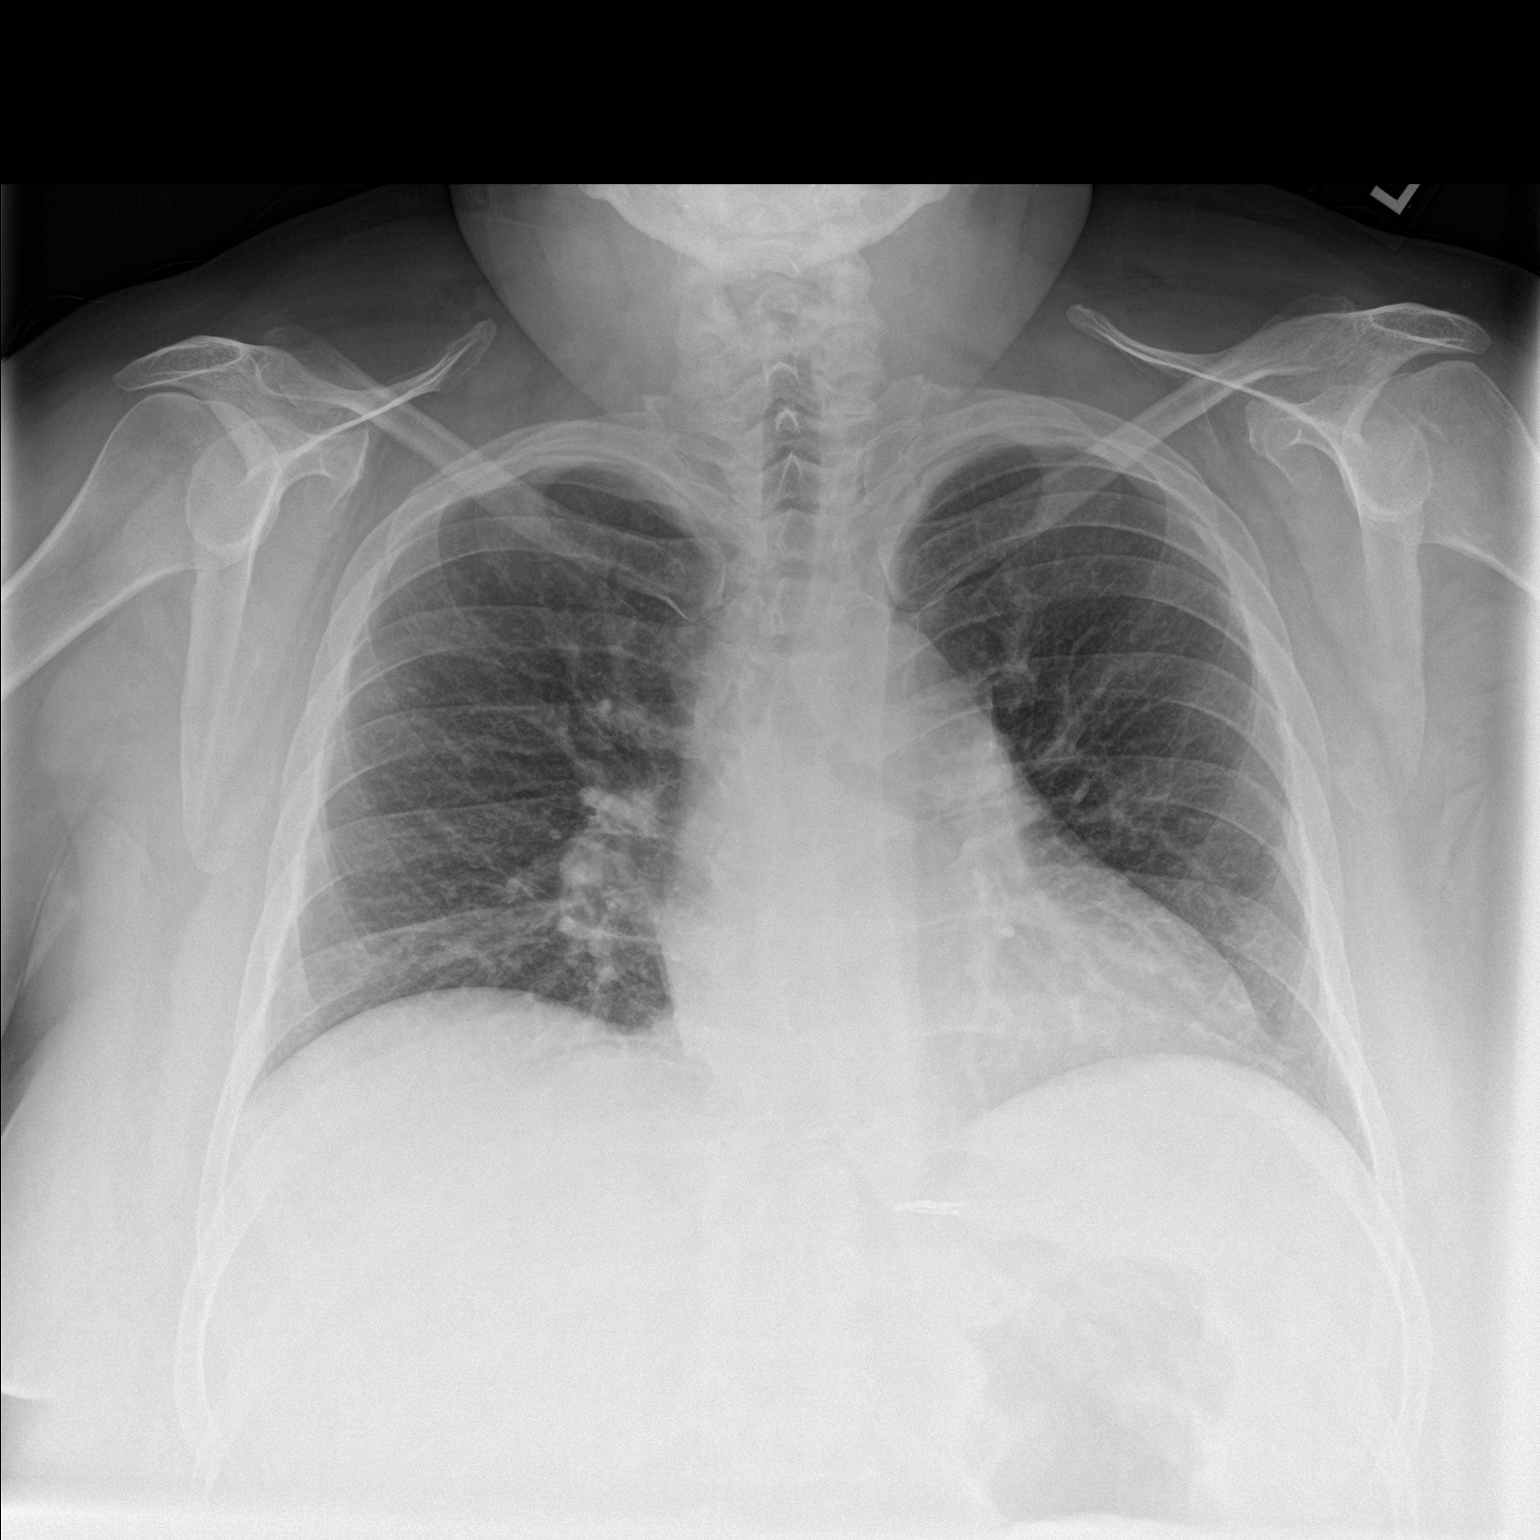

[chest lat]
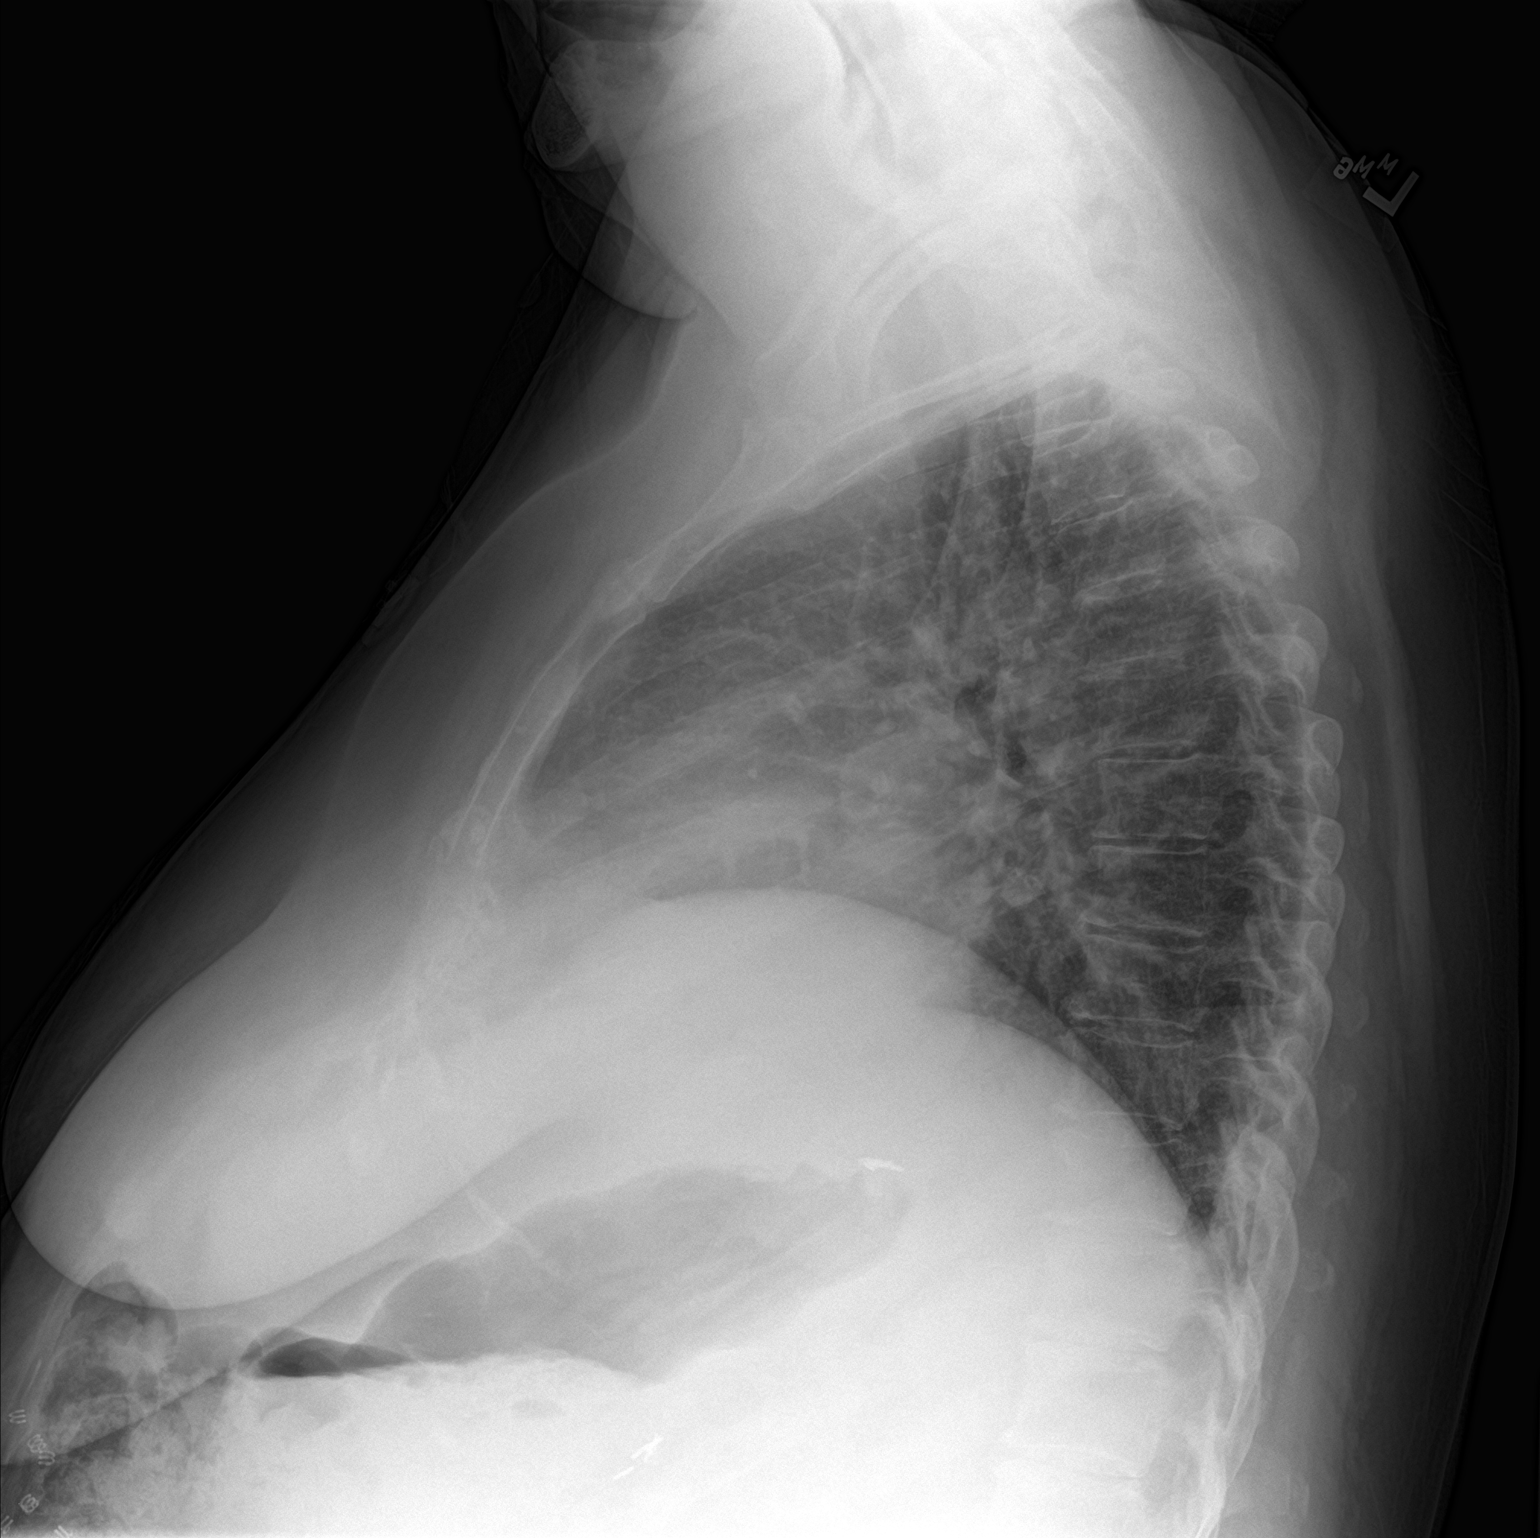

[2 of 2 positions shown; findings below may reference images not displayed]

FINDINGS: The lungs are hypoexpanded. Mild bibasilar atelectasis is noted.
Mild vascular crowding and vascular congestion are noted. No pleural
effusion or pneumothorax is seen.

The heart is borderline normal in size. No acute osseous
abnormalities are seen. Scattered clips are noted about the
gastroesophageal junction.
IMPRESSION: Lungs hypoexpanded. Mild bibasilar atelectasis noted. Mild vascular
congestion seen.

## 2016-08-26 ENCOUNTER — Other Ambulatory Visit: Payer: Self-pay | Admitting: Internal Medicine

## 2016-08-30 ENCOUNTER — Other Ambulatory Visit: Payer: Self-pay | Admitting: Pulmonary Disease

## 2016-08-30 ENCOUNTER — Other Ambulatory Visit: Payer: Self-pay | Admitting: Internal Medicine

## 2016-09-15 ENCOUNTER — Encounter: Payer: Self-pay | Admitting: Adult Health

## 2016-09-15 ENCOUNTER — Ambulatory Visit (INDEPENDENT_AMBULATORY_CARE_PROVIDER_SITE_OTHER): Payer: BLUE CROSS/BLUE SHIELD | Admitting: Adult Health

## 2016-09-15 VITALS — BP 138/72 | HR 81 | Temp 97.8°F | Ht 72.0 in | Wt 248.0 lb

## 2016-09-15 DIAGNOSIS — D869 Sarcoidosis, unspecified: Secondary | ICD-10-CM | POA: Diagnosis not present

## 2016-09-15 MED ORDER — IPRATROPIUM-ALBUTEROL 0.5-2.5 (3) MG/3ML IN SOLN: 3.0000 mL | Freq: Once | RESPIRATORY_TRACT | Status: DC

## 2016-09-15 MED ORDER — PREDNISONE 20 MG PO TABS: ORAL_TABLET | ORAL | 0 refills | Status: DC

## 2016-09-15 NOTE — Patient Instructions (Signed)
It was great seeing you today!  I have sent in a refill of the prednisone taper we did last time.   Make sure you drink plenty of water to keep your blood sugars down.   Follow up if no improvement

## 2016-09-15 NOTE — Progress Notes (Signed)
Subjective:    Patient ID: [name], female    DOB: 1961/07/16, 55 y.o.   MRN: [id number]  Cough  This is a new problem. The current episode started in the past 7 days. The problem has been unchanged. The cough is non-productive. Associated symptoms include shortness of breath and wheezing. Pertinent negatives include no chest pain, ear congestion, ear pain, fever, hemoptysis, nasal congestion, rhinorrhea, sore throat or sweats. She has tried steroid inhaler and a beta-agonist inhaler for the symptoms. The treatment provided mild relief.   She states that " this feels like a typical sarcoidosis flare"    Review of Systems  Constitutional: Negative for fever.  HENT: Negative for ear pain, rhinorrhea and sore throat.   Respiratory: Positive for cough, shortness of breath and wheezing. Negative for hemoptysis.   Cardiovascular: Negative for chest pain.  Gastrointestinal: Negative.   Neurological: Negative.    Past Medical History:  Diagnosis Date  . Abnormal SPEP 04/17/2014  . ANEMIA-UNSPECIFIED 09/18/2009  . CHF (congestive heart failure) ([place])   . Chronic diastolic heart failure, NYHA class 2 (HCC)    LVEDP roughly 20% by cath  . COPD (chronic obstructive pulmonary disease) ([place])   . Depression   . DIABETES MELLITUS, TYPE II 08/21/2006  . Diabetic osteomyelitis ([place]) 05/29/2015  . Exertional chest pain    sharp, substernal, exertional  . Fracture of 5th metatarsal    non union  . GERD 08/21/2006  . GOUT 08/20/2010  . Hx of umbilical hernia repair   . HYPERLIPIDEMIA 08/21/2006  . HYPERTENSION 08/21/2006  . Infection of wound due to methicillin resistant Staphylococcus aureus (MRSA)   . Internal hemorrhoids   . OBESITY 06/04/2009  . Onychomycosis 10/27/2015  . Osteomyelitis of left foot ([place]) 05/29/2015  . Pulmonary sarcoidosis ([place])    Followed locally by pulmonology, but also by Dr. Casper Harrison at [facility]  . Vocal cord dysfunction   . Wears partial dentures      Social History   Social History  . Marital status: Married    Spouse name: MALI WALP  . Number of children: 2  . Years of education: 12   Occupational History  . DISABLED    Social History Main Topics  . Smoking status: Never Smoker  . Smokeless tobacco: Never Used  . Alcohol use No  . Drug use: No  . Sexual activity: Yes    Birth control/ protection: Surgical   Other Topics Concern  . Not on file   Social History Narrative   Married 1994. 2 sons who both live close and 1 grandson.       Disability due to sarcoidosis. 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 ABDOMINAL HYSTERECTOMY    . APPENDECTOMY    . BLADDER SUSPENSION  11/11/2011   Procedure: TRANSVAGINAL TAPE (TVT) PROCEDURE;  Surgeon: Olga Millers, MD;  Location: [facility];  Service: Gynecology;  Laterality: N/A;  . CARDIAC CATHETERIZATION  07/2010   LVEF 50-55% WITH VERY MILD GLOBAL HYPOKINESIA; ESSENTIALLY NORMAL CORONARY ARTERIES; NORMAL LV FUNCTION  . CAROTIDS  02/18/11   CAROTID DUPLEX; VERTEBRALS ARE PATENT WITH ANTEGRADE FLOW. ICA/CCA RATIO 1.61 ON RIGHT AND 0.75 ON LEFT  . CHOLECYSTECTOMY  1984  . CYSTOSCOPY  11/11/2011   Procedure: CYSTOSCOPY;  Surgeon: Olga Millers, MD;  Location: [facility];  Service: Gynecology;  Laterality: N/A;  . DOPPLER ECHOCARDIOGRAPHY  02/12/2013   LV FUNCTION, SIZE NORMAL; MILD CONCENTRIC LVH; EST EF 55-65%; WALL MOTION NORMAL  . FRACTURE SURGERY     foot  . HERNIA REPAIR    . I&D EXTREMITY Left 06/27/2015   Procedure: Partial Excision Left Calcaneus, Place Antibiotic Beads, and Wound VAC;  Surgeon: Newt Minion, MD;  Location: [facility];  Service: Orthopedics;  Laterality: Left;  . KNEE ARTHROSCOPY     right  . LEFT AND RIGHT HEART CATHETERIZATION WITH CORONARY ANGIOGRAM N/A  04/23/2013   Procedure: LEFT AND RIGHT HEART CATHETERIZATION WITH CORONARY ANGIOGRAM;  Surgeon: Leonie Man, MD;  Location: [facility];  Service: Cardiovascular;  Laterality: N/A;  . LexiScan Myoview  03/09/2013   EF 50%; NORMAL MYOCARDIAL PERFUSION STUDY - breast attenuation  . METATARSAL OSTEOTOMY WITH OPEN REDUCTION INTERNAL FIXATION (ORIF) METATARSAL WITH FUSION Left 04/09/2014   Procedure: LEFT FOOT FRACTURE OPEN TREATMENT METATARSAL INCLUDES INTERNAL FIXATION EACH;  Surgeon: Lorn Junes, MD;  Location: [facility];  Service: Orthopedics;  Laterality: Left;  . NISSEN FUNDOPLICATION  [id number]  . Right and left CARDIAC CATHETERIZATION  04/23/2013   Angiographic normal coronaries; LVEDP 20 mmHg, PCWP 12-14 mmHg, RAP 12 mmHg.; Fick CO/CI 4.9/2.2  . TUBAL LIGATION     with reversal in 1994  . VENTRAL HERNIA REPAIR      Family History  Problem Relation Age of Onset  . Diabetes Father   . Heart attack Father   . Coronary artery disease Father   . Heart failure Father   . COPD Mother   . Emphysema Mother   . Asthma Mother   . Heart failure Mother   . Heart attack Maternal Grandfather   . Sarcoidosis Maternal Uncle   . Lung cancer Brother   . Diabetes Brother   . Colon cancer Neg Hx     Allergies  Allergen Reactions  . Methotrexate Other (See Comments)     peri-oral and buccal lesions.  . Vancomycin Other (See Comments)    nephrotoxicity  . Clindamycin/Lincomycin Nausea And Vomiting and Rash  . Chlorhexidine Itching  . Doxycycline Rash  . Lisinopril Cough  . Teflaro [Ceftaroline] Rash    Current Outpatient Prescriptions on File Prior to Visit  Medication Sig Dispense Refill  . acetaminophen (TYLENOL) 325 MG tablet Take 650 mg by mouth every 6 (six) hours as needed for headache. Reported on 01/05/2016    . albuterol (PROVENTIL HFA;VENTOLIN HFA) 108 (90 BASE) MCG/ACT inhaler Inhale 1-2 puffs into the lungs every 6 (six) hours as needed for wheezing or shortness  of breath. 8 g 5  . albuterol (PROVENTIL) (2.5 MG/3ML) 0.083% nebulizer solution Take 3 mLs (2.5 mg total) by nebulization every 6 (six) hours as needed for wheezing or shortness of breath. 150 mL 6  . allopurinol (ZYLOPRIM) 100 MG tablet Take 100 mg by mouth at bedtime.     [name] aspirin EC 81 MG tablet Take 81 mg by mouth daily.     [name] atorvastatin (LIPITOR) 40 MG tablet TAKE 1 TAB BY MOUTH ONCE DAILY 90 tablet 3  . benzonatate (TESSALON) 100 MG capsule Take 100 mg by mouth 3 (three) times daily as needed for cough. Reported on 01/05/2016    . buPROPion (WELLBUTRIN XL) 300 MG 24 hr tablet Take 1 tablet (300 mg total) by mouth daily. 90 tablet 3  . carvedilol (COREG)  25 MG tablet Take 1 tablet (25 mg total) by mouth 2 (two) times daily with a meal. 180 tablet 3  . chlorpheniramine-HYDROcodone (TUSSIONEX) 10-8 MG/5ML SUER Take 5 mLs by mouth daily as needed for cough. Reported on 01/05/2016  0  . cholecalciferol 5000 UNITS TABS Take 1 tablet (5,000 Units total) by mouth daily. (Patient taking differently: Take 5,000 Units by mouth every morning. ) 30 tablet 3  . DEXILANT 60 MG capsule TAKE ONE CAPSULE BY MOUTH EVERY DAY 30 capsule 5  . diltiazem (CARTIA XT) 120 MG 24 hr capsule Take 1 capsule (120 mg total) by mouth daily. 30 capsule 1  . doxycycline (VIBRA-TABS) 100 MG tablet Take 1 tablet (100 mg total) by mouth 2 (two) times daily. 14 tablet 0  . estradiol (ESTRACE) 2 MG tablet Take 2 mg by mouth at bedtime.     . fenofibrate (TRICOR) 145 MG tablet Take 1 tablet (145 mg total) by mouth daily. 30 tablet 10  . fluticasone (FLONASE) 50 MCG/ACT nasal spray Place 2 sprays into both nostrils daily. 48 g 0  . furosemide (LASIX) 40 MG tablet TAKE 1 TABLET BY MOUTH TWICE A DAY 60 tablet 3  . gabapentin (NEURONTIN) 100 MG capsule TAKE 1 CAPSULE (100 MG TOTAL) BY MOUTH 3 (THREE) TIMES DAILY. 90 capsule 0  . glucose blood (ONE TOUCH ULTRA TEST) test strip 1 each by Other route 3 (three) times daily. Use as  instructed 100 each 5  . HYDROcodone-acetaminophen (NORCO/VICODIN) 5-325 MG tablet 1 tablet by mouth 2-3 times a day as needed for pain  0  . insulin aspart (NOVOLOG FLEXPEN) 100 UNIT/ML FlexPen Inject 12-20 Units into the skin 3 (three) times daily with meals. Per sliding scale 30 mL 5  . Insulin Pen Needle 33G X 4 MM MISC 1 each by Does not apply route 4 (four) times daily. 200 each 11  . losartan (COZAAR) 100 MG tablet Take 1 tablet (100 mg total) by mouth daily. 90 tablet 3  . metFORMIN (GLUCOPHAGE) 1000 MG tablet TAKE ONE TABLET BY MOUTH TWICE DAILY WITH MEALS 180 tablet 0  . mometasone-formoterol (DULERA) 200-5 MCG/ACT AERO Inhale 2 puffs into the lungs 2 (two) times daily. 13 g 5  . neomycin-polymyxin-hydrocortisone (CORTISPORIN) otic solution Apply one to two drops to toe after soaking twice daily. 10 mL 0  . nitroGLYCERIN (NITROSTAT) 0.4 MG SL tablet Place 0.4 mg under the tongue every 5 (five) minutes as needed. Reported on 01/05/2016    . ondansetron (ZOFRAN-ODT) 4 MG disintegrating tablet Take 4 mg by mouth every 8 (eight) hours as needed for nausea or vomiting. Reported on 01/05/2016  0  . PARoxetine Mesylate (BRISDELLE) 7.5 MG CAPS Take 7.5 mg by mouth daily.    Vladimir Faster Glycol-Propyl Glycol (SYSTANE) 0.4-0.3 % SOLN Apply 1 drop to eye at bedtime.    . potassium chloride SA (K-DUR,KLOR-CON) 20 MEQ tablet Take 1 tablet (20 mEq total) by mouth 3 (three) times daily. 90 tablet 1  . potassium chloride SA (K-DUR,KLOR-CON) 20 MEQ tablet TAKE ONE TABLET BY MOUTH THREE TIMES DAILY 90 tablet 3  . predniSONE (DELTASONE) 20 MG tablet 60mg  PO qam x 3 days, 40mg  po qam x 3 days, 20mg  qam x 3 days 18 tablet 0  . PREDNISONE PO Take 4 mg by mouth daily.    [name] terbinafine (LAMISIL) 250 MG tablet Take 1 tablet (250 mg total) by mouth daily. 30 tablet 3  . TOUJEO SOLOSTAR 300 UNIT/ML SOPN INJECT 40  UNITS INTO THE SKIN AT BEDTIME 13.5 pen 1  . triamcinolone cream (KENALOG) 0.1 % Apply 1 application  topically 2 (two) times daily. To abdomen rash for 10 days. 30 g 0  . venlafaxine XR (EFFEXOR-XR) 75 MG 24 hr capsule Take 2 capsules (150 mg total) by mouth at bedtime.    . vitamin B-12 (CYANOCOBALAMIN) 1000 MCG tablet Take 1,000 mcg by mouth daily.     No current facility-administered medications on file prior to visit.     BP 138/72   Pulse 81   Temp 97.8 F (36.6 C) (Oral)   Ht 6' (1.829 m)   Wt 248 lb (112.5 kg)   BMI 33.63 kg/m       Objective:   Physical Exam  Constitutional: She is oriented to person, place, and time. She appears well-developed and well-nourished. No distress.  Cardiovascular: Normal rate, regular rhythm, normal heart sounds and intact distal pulses.   No murmur heard. Pulmonary/Chest: Effort normal. No respiratory distress. She has wheezes. She has no rales. She exhibits no tenderness.  Decreased breath sounds in lower lung fields  Neurological: She is alert and oriented to person, place, and time.  Skin: Skin is warm and dry. No rash noted. She is not diaphoretic. No erythema. No pallor.  Psychiatric: She has a normal mood and affect. Her behavior is normal. Judgment and thought content normal.  Nursing note and vitals reviewed.     Assessment & Plan:  1. Sarcoidosis ([name]) - Will treat as sarcoidosis flare - predniSONE (DELTASONE) 20 MG tablet; 60mg  PO qam x 3 days, 40mg  po qam x 3 days, 20mg  qam x 3 days  Dispense: 18 tablet; Refill: 0 - ipratropium-albuterol (DUONEB) 0.5-2.5 (3) MG/3ML nebulizer solution 3 mL; Take 3 mLs by nebulization once. - She endorsed improvement after duoneb. Her wheezing had resolved and she was not as tight in the bases.  - Follow up if no improvement  Dorothyann Peng, NP

## 2016-09-17 DIAGNOSIS — E119 Type 2 diabetes mellitus without complications: Secondary | ICD-10-CM | POA: Diagnosis not present

## 2016-09-17 DIAGNOSIS — H20022 Recurrent acute iridocyclitis, left eye: Secondary | ICD-10-CM | POA: Diagnosis not present

## 2016-09-17 DIAGNOSIS — Z0100 Encounter for examination of eyes and vision without abnormal findings: Secondary | ICD-10-CM | POA: Diagnosis not present

## 2016-09-17 DIAGNOSIS — H2511 Age-related nuclear cataract, right eye: Secondary | ICD-10-CM | POA: Diagnosis not present

## 2016-09-22 ENCOUNTER — Ambulatory Visit: Payer: BLUE CROSS/BLUE SHIELD | Admitting: Family Medicine

## 2016-09-29 ENCOUNTER — Other Ambulatory Visit: Payer: Self-pay | Admitting: Cardiology

## 2016-09-29 ENCOUNTER — Ambulatory Visit (INDEPENDENT_AMBULATORY_CARE_PROVIDER_SITE_OTHER): Payer: BLUE CROSS/BLUE SHIELD | Admitting: Internal Medicine

## 2016-09-29 ENCOUNTER — Encounter: Payer: Self-pay | Admitting: Internal Medicine

## 2016-09-29 VITALS — BP 132/70 | HR 79 | Ht 72.0 in | Wt 244.0 lb

## 2016-09-29 DIAGNOSIS — Z794 Long term (current) use of insulin: Secondary | ICD-10-CM

## 2016-09-29 DIAGNOSIS — E1140 Type 2 diabetes mellitus with diabetic neuropathy, unspecified: Secondary | ICD-10-CM | POA: Diagnosis not present

## 2016-09-29 DIAGNOSIS — E1165 Type 2 diabetes mellitus with hyperglycemia: Secondary | ICD-10-CM

## 2016-09-29 DIAGNOSIS — IMO0002 Reserved for concepts with insufficient information to code with codable children: Secondary | ICD-10-CM

## 2016-09-29 LAB — POCT GLYCOSYLATED HEMOGLOBIN (HGB A1C): Hemoglobin A1C: 8.7

## 2016-09-29 MED ORDER — INSULIN GLARGINE 300 UNIT/ML ~~LOC~~ SOPN: 45.0000 [IU] | PEN_INJECTOR | Freq: Every day | SUBCUTANEOUS | 5 refills | Status: DC

## 2016-09-29 NOTE — Telephone Encounter (Signed)
Rx(s) sent to pharmacy electronically.  

## 2016-09-29 NOTE — Progress Notes (Signed)
Patient ID: [name], female   DOB: 04-Apr-1961, 55 y.o.   MRN: [id number]  HPI: [name] is a 55 y.o.-year-old female, returning for f/u DM2, dx 2007, insulin-dependent, uncontrolled, with complications (CHF stage 2, peripheral neuropathy). Last visit 8.5 mo ago.  She lost 20 pounds since last visit!  She lost both parents this fall/winter. She was their caregiver.  She had steroids 2 weeks ago for sarcoidosis exacerbation.  She drinks pineapple juice every night.  She plans to start going to [employer].   Last hemoglobin A1c was: Lab Results  Component Value Date   HGBA1C 7.5 01/15/2016   HGBA1C 8.4 (H) 10/14/2015   HGBA1C 7.4 10/07/2015  She is on Prednisone 10 >> 40 >> 7 mg for sarcoidosis.   [name] on a regimen of: - Metformin 1000 mg 2x a day - Toujeo 40 units at bedtime - NovoLog 15 min before every meal: - 12 units with a small meal - 16 units with a regular meal - 18-20 units with a large meal or dinner She stopped Invokana 300 mg daily - added 01/2014 - had 2 yeast infections (stopped 06/2014) We stopped Glipizide XL 10 mg. We stopped Glyburide 2.5 mg  Pt checks her sugars 3x a day and they are (per her recall): - am: 200-210 >> 135-225, 266 >> 130-169, 180 >> 100-125 >> 97-210 >> 135-225 - 2h after b'fast: n/c >> 200, 315 >> n/c - before lunch: n/c >> n/c >> n/c >> 190s >> n/c >> 130-180 >> 130s >> 163-212 >> 150s - 2h after lunch: n/c >> n/c >> 414 >> n/c >> 190, 195 - before dinner: 230 >> n/c >> 190s >> 268 >> 140-160 >> 130s >> 170-185, 345 (Pred) >> 160-170 - 2h after dinner: n/c >> 245 >> 229, 247 >> n/c >> 185, 208 >> n/c - bedtime: 199-395 >> 190s >> 240-342 >> 150-329 >> 150s >> 185-337 >> 180 - nighttime: n/c No lows. Lowest sugar was 130 >> 100 >> 97 >> 120; she has hypoglycemia awareness at 70s.  Highest sugar was 500s >> 300s >> 184 >> 337 >> 250.  Meter: Freestyle Freedom Lite.   She had a nutrition class >> helped.   Pt's  meals are: - Breakfast: egg + bacon or if go out: bisquit - Lunch: sandwich - Dinner: meat + veggies + starch - Snacks: pork skins and cookies - 1-2 a day; also after dinner No exercise.  - no CKD, last BUN/creatinine:  Lab Results  Component Value Date   BUN 16 01/26/2016   CREATININE 0.97 01/26/2016  On Losartan 50 mg. - last set of lipids: Lab Results  Component Value Date   CHOL 171 01/15/2016   HDL 33.30 (L) 01/15/2016   LDLCALC UNABLE TO CALCULATE IF TRIGLYCERIDE OVER 400 mg/dL 05/28/2011   LDLDIRECT 76.0 01/15/2016   TRIG (H) 01/15/2016    446.0 Triglyceride is over 400; calculations on Lipids are invalid.   CHOLHDL 5 01/15/2016  On Lipitor. - last eye exam was in 09/2016- sees eye dr once a year. No DR. Had cataract sx (left) and now ocular sarcoidosis. She will have R cataract Sx 10/12/2016.  - + numbness and tingling in her feet. She has restless legs. On Neurontin 100 mg hs >> works well.  She also has gout, HL, HTN, sarcoidosis of lung, GERD, COPD. She had a foot fracture 11/2013 >> screws placed >> infected: MRSA >> cleaned 2x (last by Dr Sharol Given). MRI  in 10/2014: no more infections.   I reviewed pt's medications, allergies, PMH, social hx, family hx, and changes were documented in the history of present illness. Otherwise, unchanged from my initial visit note.  ROS: Constitutional:+ weight loss, + fatigue, + hot flushes Eyes: no blurry vision, no xerophthalmia ENT: no sore throat, no nodules palpated in throat, no dysphagia/odynophagia, no hoarseness Cardiovascular: no CP/SOB/palpitations/periankle swelling Respiratory: + cough/no SOB Gastrointestinal: no N/V/D/C/+ heartburn Musculoskeletal: no muscle/joint aches Skin: no rashes Neurological: no tremors/tingling/dizziness  PE: BP 132/70   Pulse 79   Ht 6' (1.829 m)   Wt 244 lb (110.7 kg)   SpO2 98%   BMI 33.09 kg/m  Body mass index is 33.09 kg/m. Wt Readings from Last 3 Encounters:  09/29/16 244 lb  (110.7 kg)  09/15/16 248 lb (112.5 kg)  07/07/16 256 lb 12.8 oz (116.5 kg)   Constitutional: obese, in NAD Eyes: PERRLA, EOMI, no exophthalmos ENT: moist mucous membranes, no thyromegaly, no cervical lymphadenopathy Cardiovascular: tachycardia, RR, No MRG Respiratory: CTA B Gastrointestinal: abdomen soft, NT, ND, BS+ Musculoskeletal: no deformities, strength intact in all 4 Skin: moist, warm, no rashes Neurological: no tremor with outstretched hands  ASSESSMENT: 1. DM2, insulin-dependent, uncontrolled, with complications - CHF stage 2 - peripheral neuropathy  2. Diabetic peripheral neuropathy - on neurontin 100 >> works well  PLAN:  1. Patient with long-standing, uncontrolled diabetes, on oral antidiabetic regimen + Basal insulin +  mealtime insulin. Sugars are worse now with the stress of her parents dying and also adding pineapple juice at night >> will stop this. Reviewed with her and her friend how to read labels >> discussed to try to use at most 45g carbs per meal and 15 per snack. - will also increase Toujeo. - I suggested to:  Patient Instructions  Please increase: - Toujeo to 45 units at bedtime. If the sugars in am are still >150 after you stop the pineapple juice, then increase to 50 units.  Please continue: - Metformin 1000 mg 2x a day - NovoLog 15 min before every meal: - 12 units with a small meal - 16 units with a regular meal - 18-20 units with a large meal or dinner  Please return in 3 months with your sugar log.   - continue checking sugars at different times of the day - check 3 times a day, rotating checks! She needs to bring her log at next visit as she forgot it today. - checked HbA1c today >> 9.2% (higher!) - she is up to date with yearly eye exams - Return to clinic in 3 mo with sugar log   2. Diabetic PN - continue Neurontin 100 mg hs  - no SE  Philemon Kingdom, MD PhD [facility] Endocrinology

## 2016-09-29 NOTE — Addendum Note (Signed)
Addended by: Verlin Grills T on: 09/29/2016 11:14 AM   Modules accepted: Orders

## 2016-09-29 NOTE — Patient Instructions (Addendum)
Please increase: - Toujeo to 45 units at bedtime. If the sugars in am are still >150 after you stop the pineapple juice, then increase to 50 units.  Please continue: - Metformin 1000 mg 2x a day - NovoLog 15 min before every meal: - 12 units with a small meal - 16 units with a regular meal - 18-20 units with a large meal or dinner  Please return in 3 months with your sugar log.

## 2016-09-30 ENCOUNTER — Ambulatory Visit: Payer: BLUE CROSS/BLUE SHIELD | Admitting: Family Medicine

## 2016-09-30 ENCOUNTER — Other Ambulatory Visit: Payer: Self-pay | Admitting: Internal Medicine

## 2016-10-09 IMAGING — DX DG CHEST 2V
2 series · 2 of 2 positions shown · non-contrast
Comparison: 05/07/2015 and 10/13/2015.

CLINICAL DATA: Nonproductive cough for 4 days. Shortness breath
with generalized chest pain. History of COPD/ asthma, hypertension
and diabetes.

EXAM:
CHEST  2 VIEW

[chest pa]
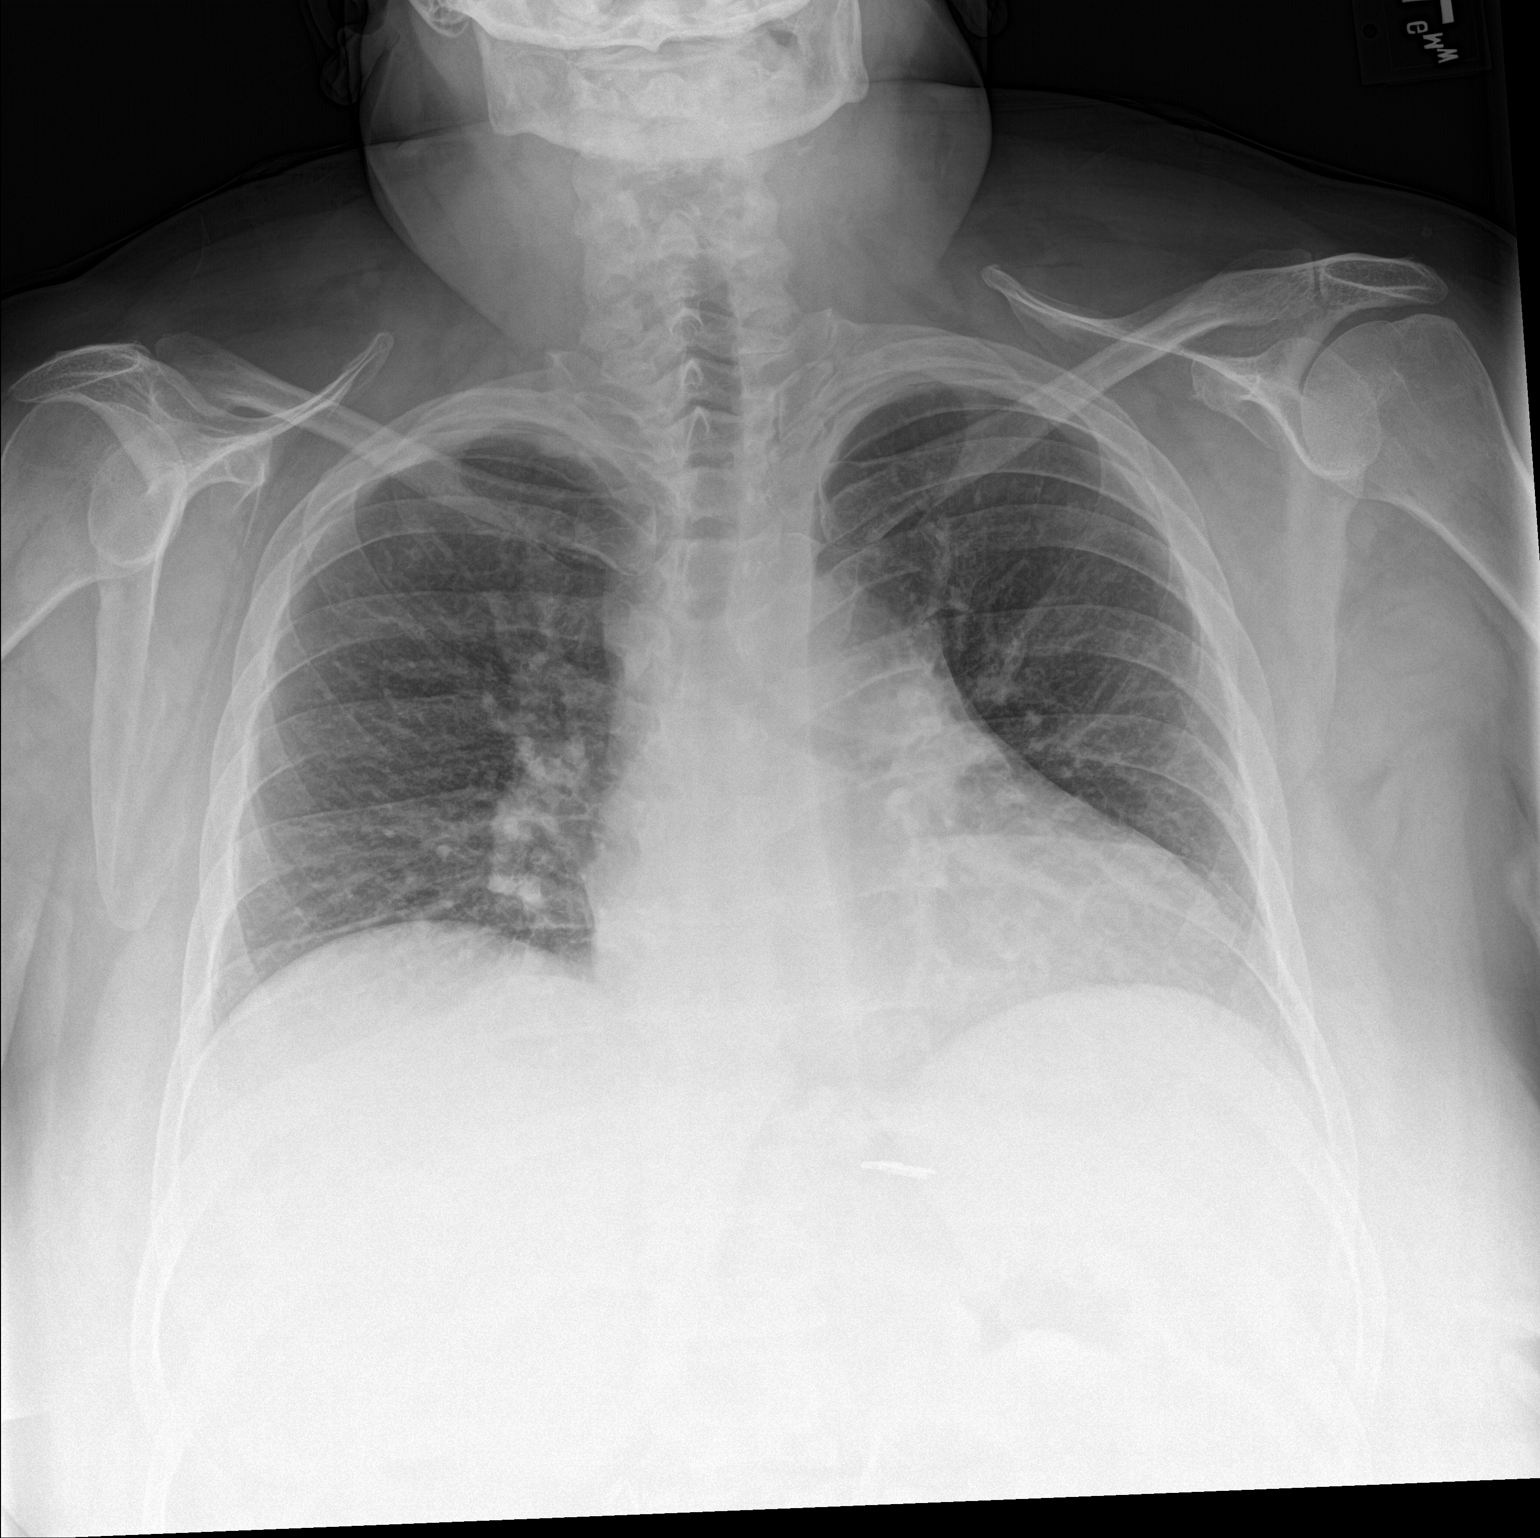

[chest lat]
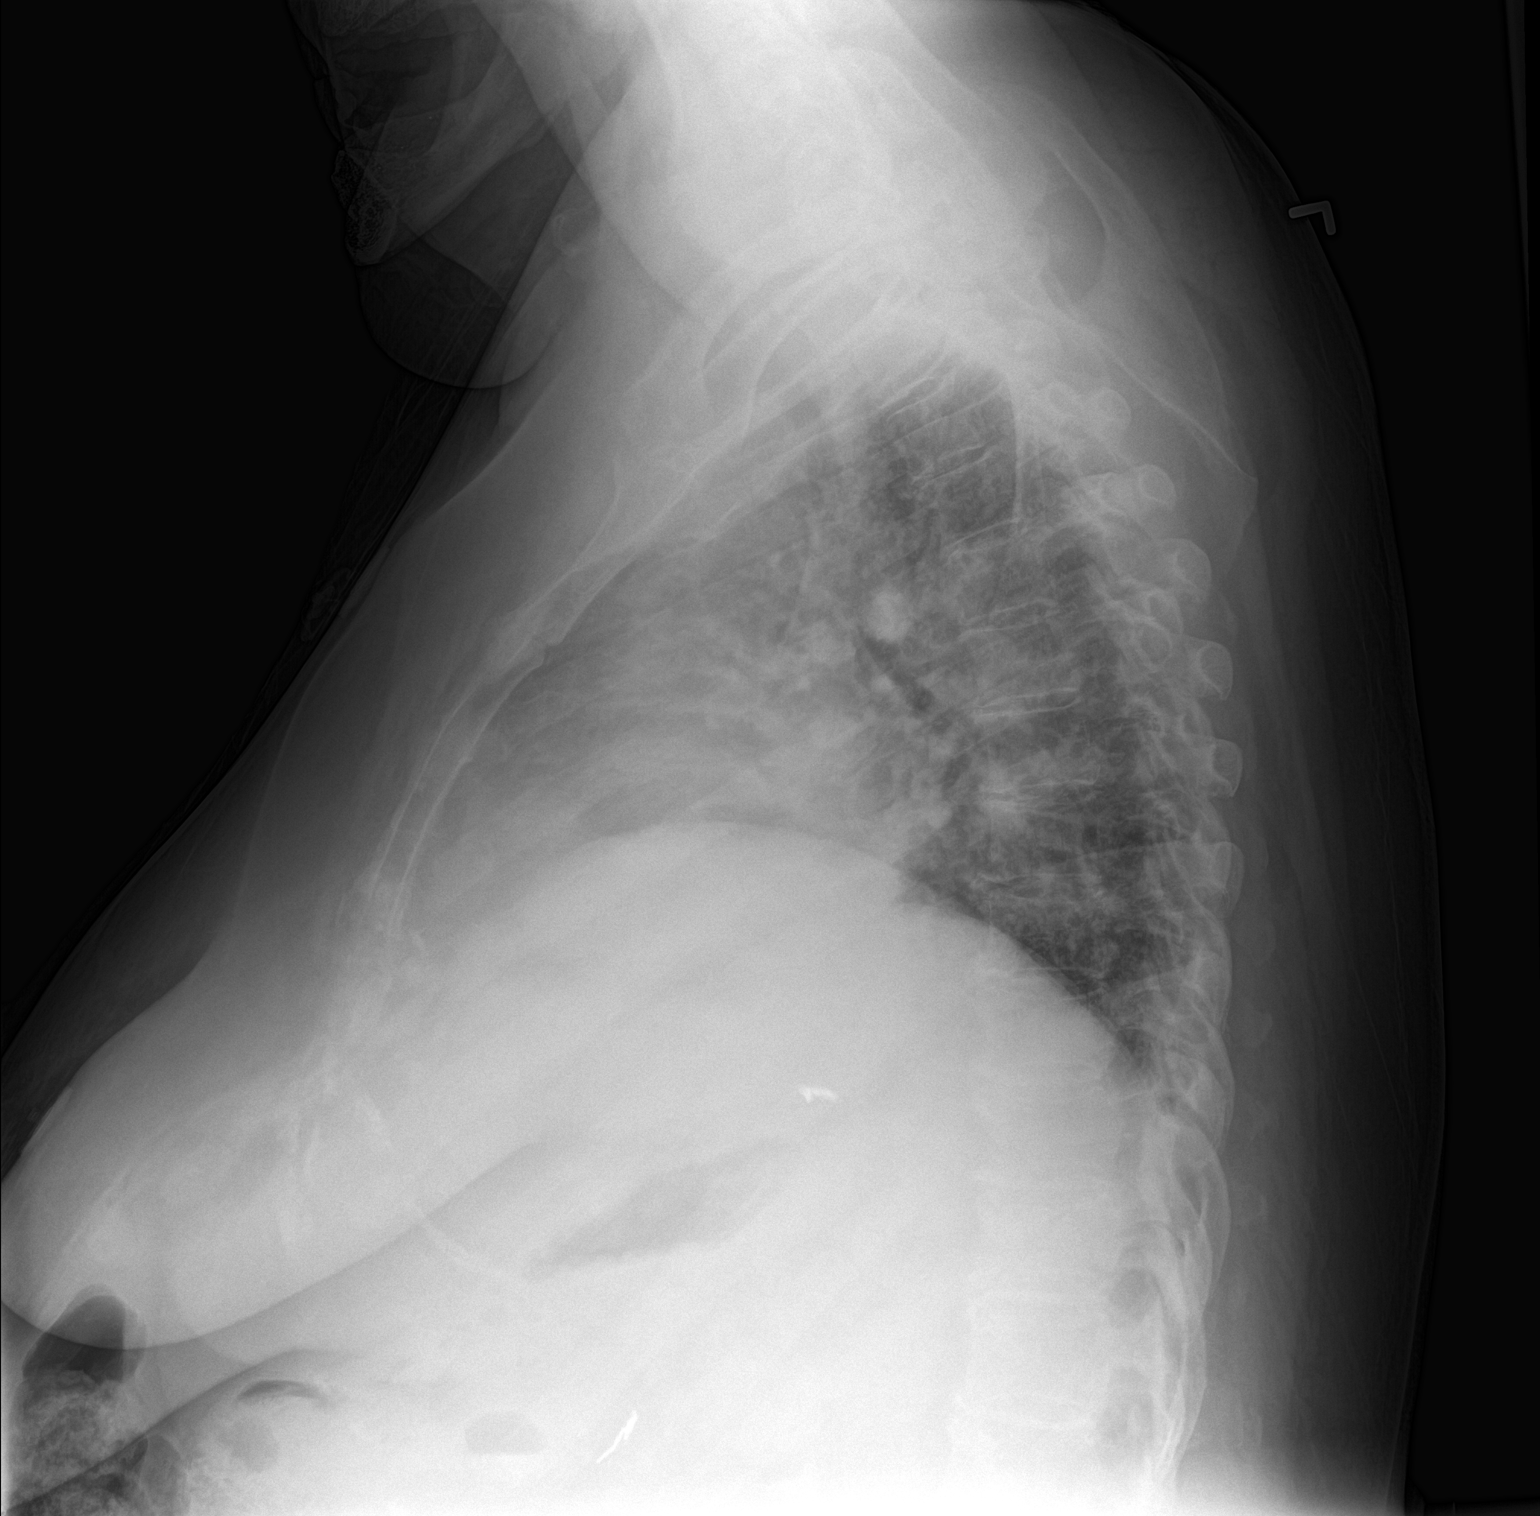

[2 of 2 positions shown; findings below may reference images not displayed]

FINDINGS: The heart size and mediastinal contours are stable. There are
persistent low lung volumes with bibasilar atelectasis and vascular
crowding. No edema, confluent airspace opacity or significant
pleural effusion identified. The bones appear unchanged.
IMPRESSION: Stable chest with low lung volumes and bibasilar atelectasis. No
acute findings.

## 2016-10-09 IMAGING — DX DG NECK SOFT TISSUE
2 series · 2 of 2 positions shown · non-contrast
Comparison: None.

CLINICAL DATA: Upper airway wheezing.

EXAM:
NECK SOFT TISSUES - 1+ VIEW

[w soft tissue neck ap (1 of 2)]
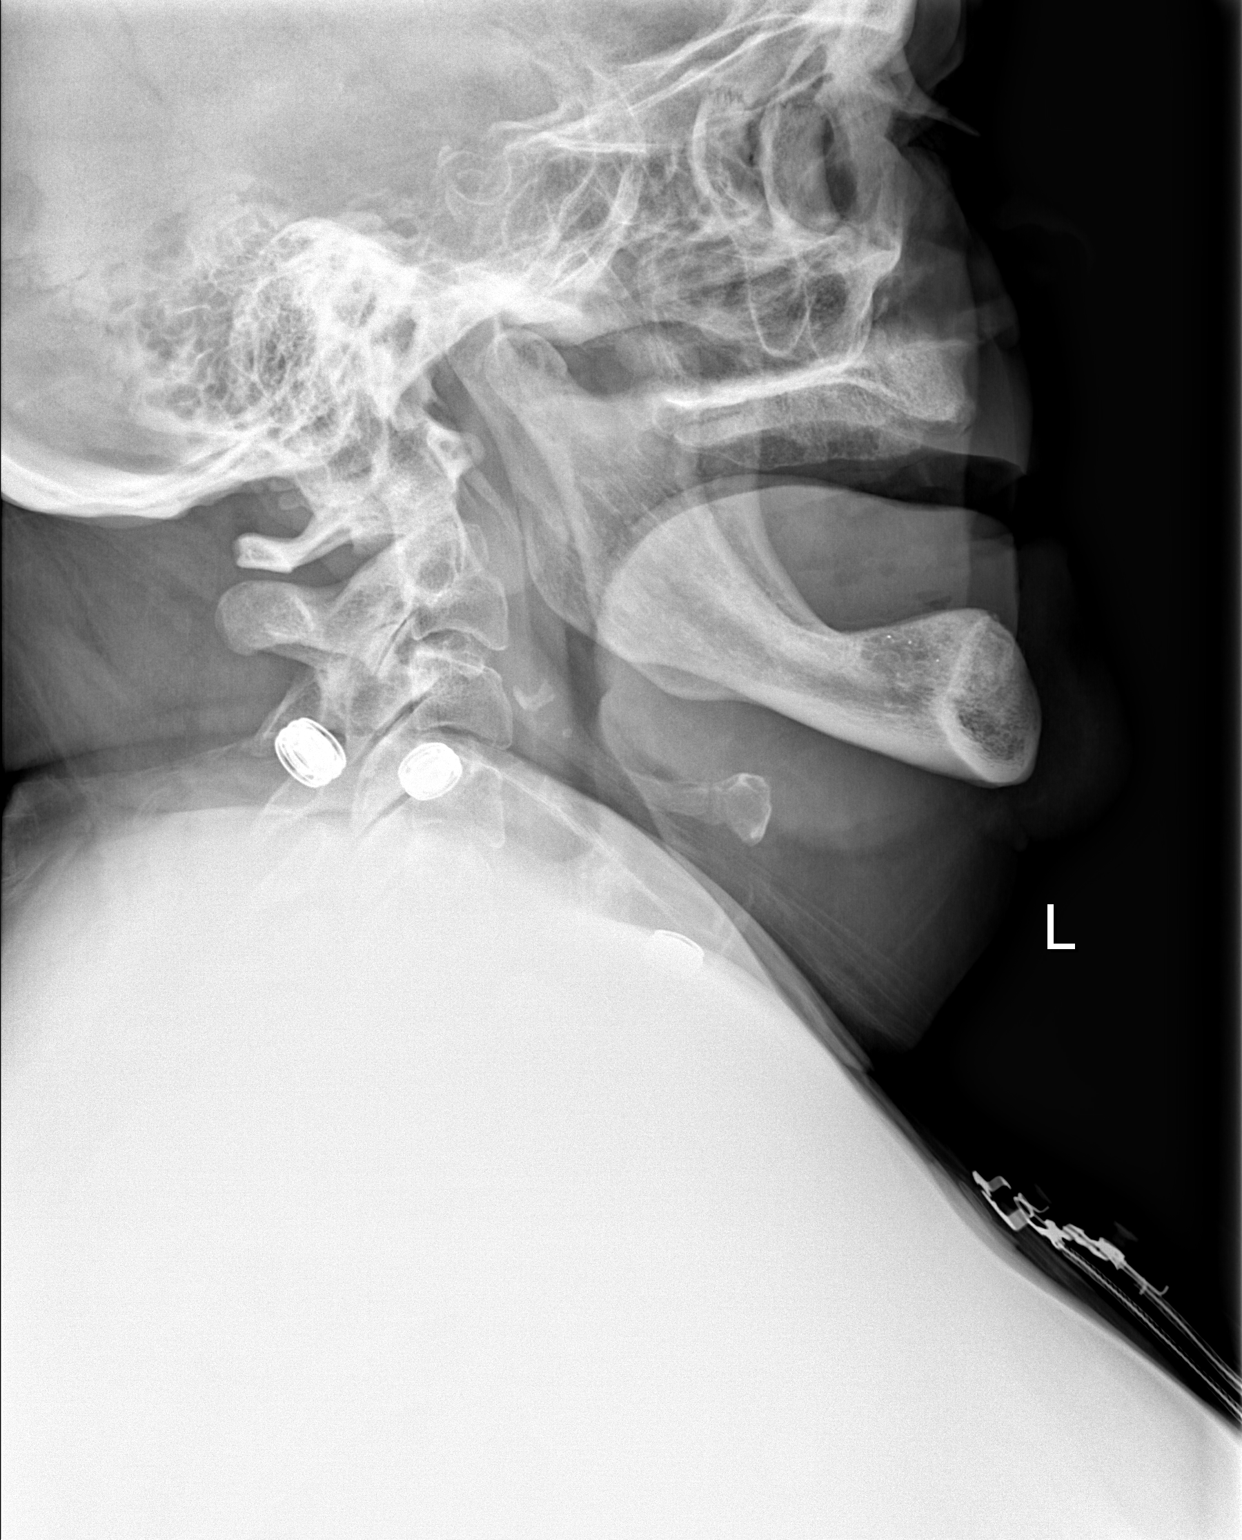

[w soft tissue neck ap (2 of 2)]
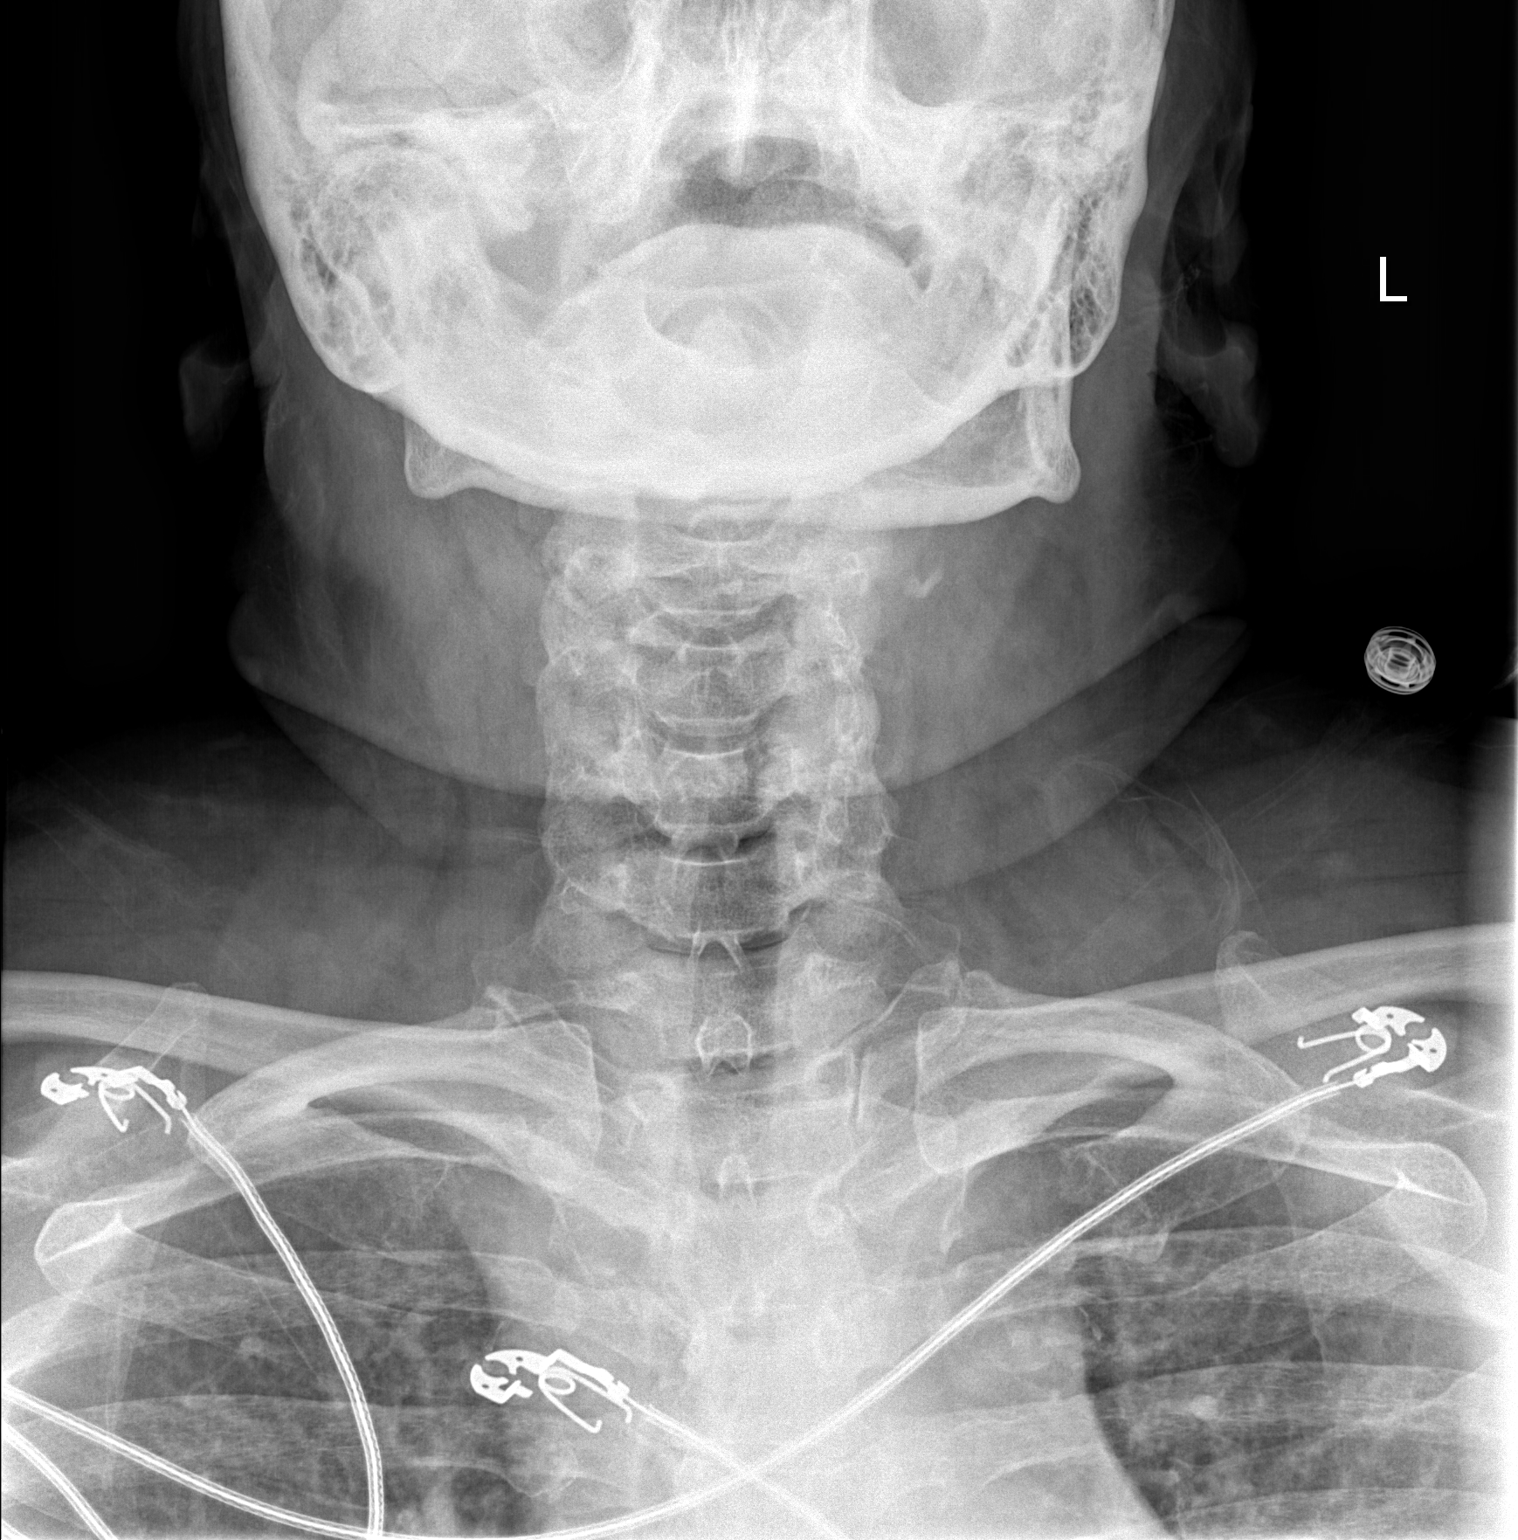

[2 of 2 positions shown; findings below may reference images not displayed]

FINDINGS: The valleculae and epiglottis are obscured. There is no evidence of
retropharyngeal soft tissue swelling. The cervical airway is
unremarkable. No radio-opaque foreign body identified. Patient is
edentulous.
IMPRESSION: Vallecular and epiglottis are obscured, significance uncertain. Neck
CT, preferably with contrast could be considered for further
evaluation.

## 2016-10-09 IMAGING — CT CT NECK W/ CM
4 of 5 series · 15 of 33 positions shown, 17 images · IV contrast (Omni 300)
Comparison: None.

CLINICAL DATA: Initial evaluation for upper airway irritation.

EXAM:
CT NECK WITH CONTRAST
TECHNIQUE: Multidetector CT imaging of the neck was performed using the
standard protocol following the bolus administration of intravenous
contrast.
CONTRAST:  75mL OMNIPAQUE IOHEXOL 300 MG/ML  SOLN

[Series 3: neck 2.0 st · axial · 0.38mm/px · z∈[-227,-107]mm · 4 of 102 slices shown, 5 images (1 of 3)]
[im 21/102  soft-tissue]
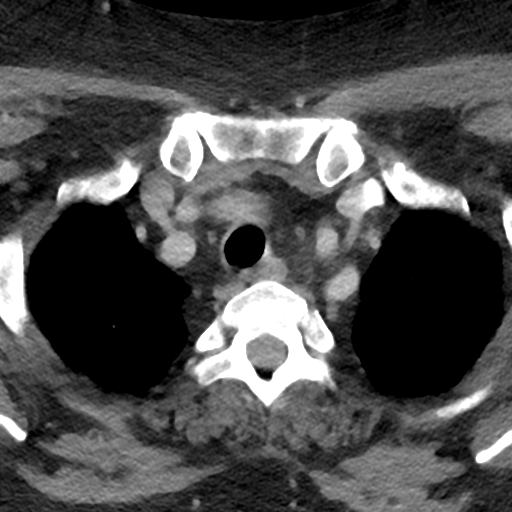
[im 21/102  bone]
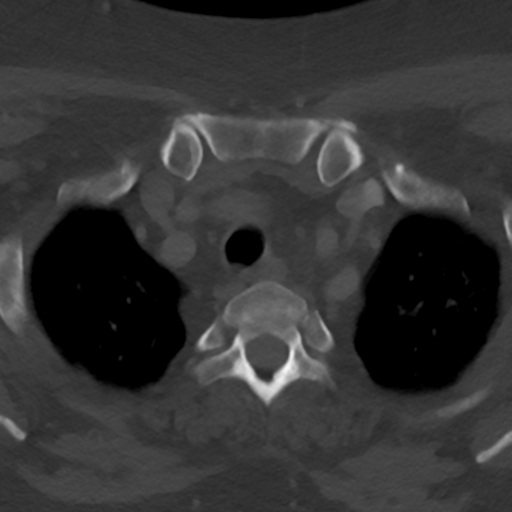
[im 41/102  bone]
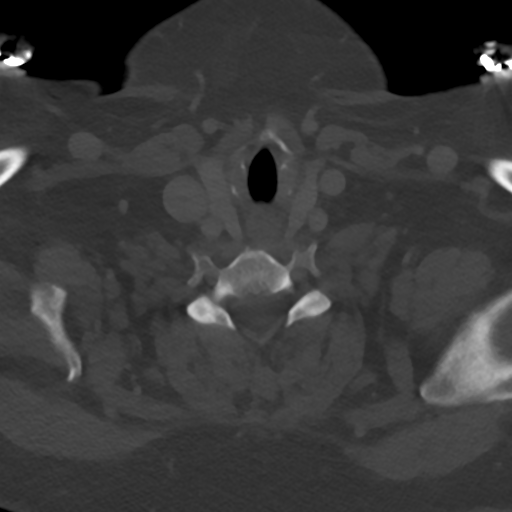
[im 61/102  bone]
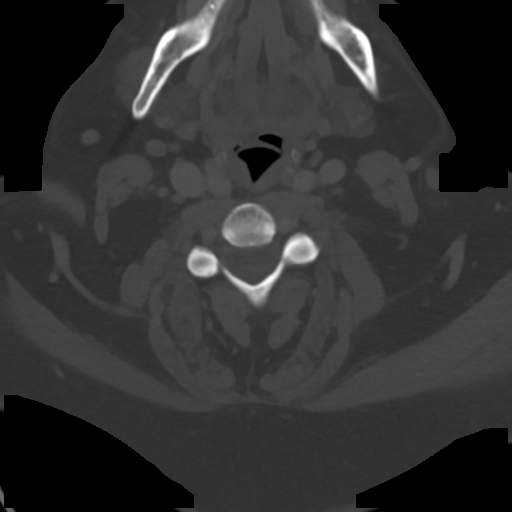
[im 81/102  bone]
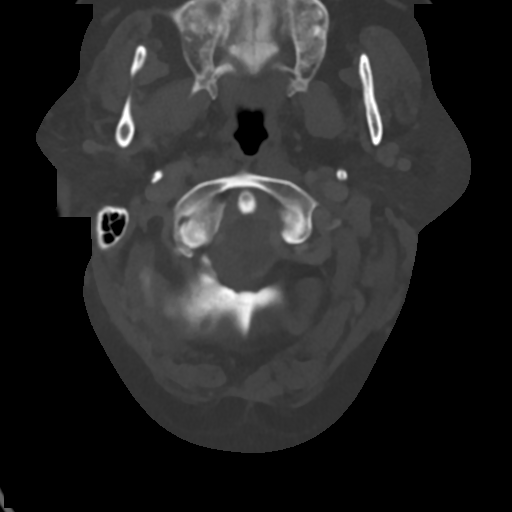

[Series 5: neck 2.0 st · sagittal · 0.40mm/px · 5 of 111 slices shown, 6 images (2 of 3)]
[im 37/111  bone]
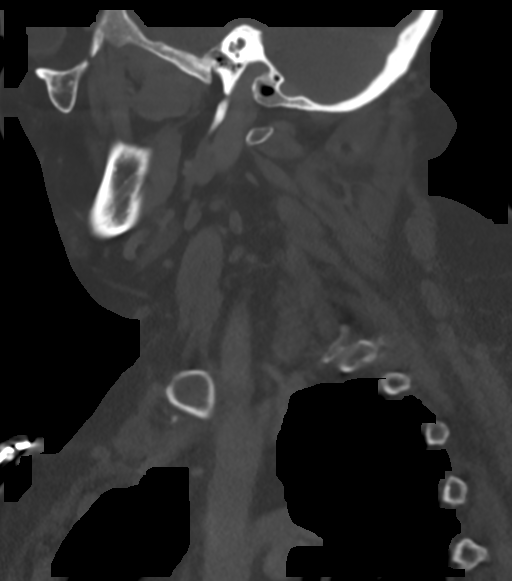
[im 46/111  bone]
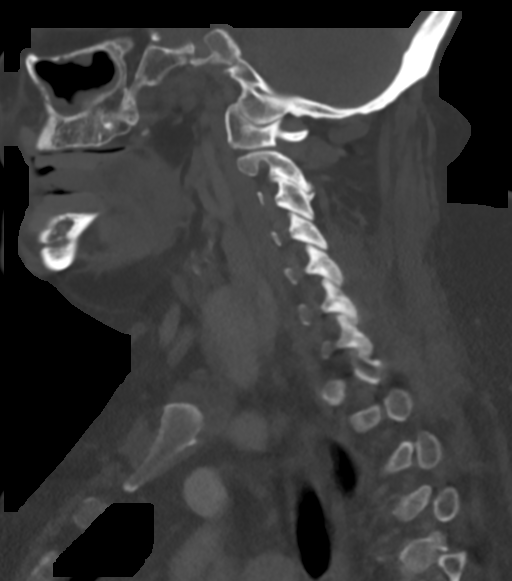
[im 56/111  soft-tissue]
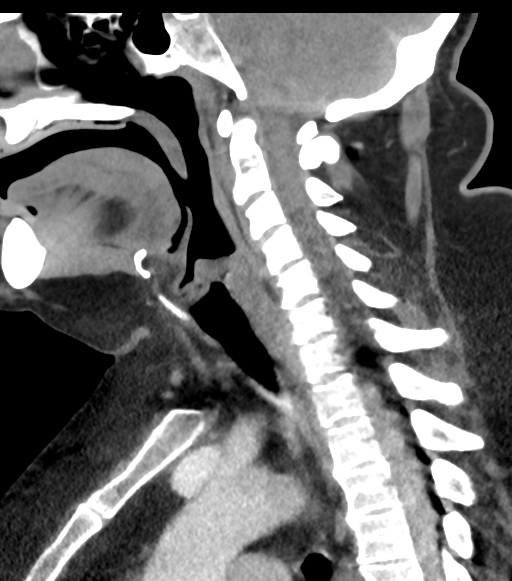
[im 56/111  bone]
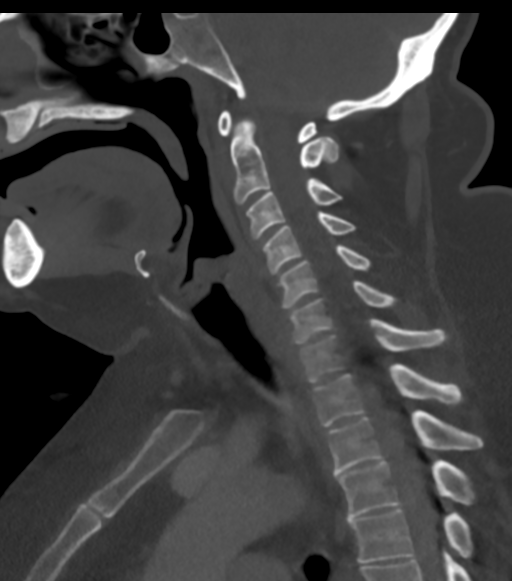
[im 65/111  bone]
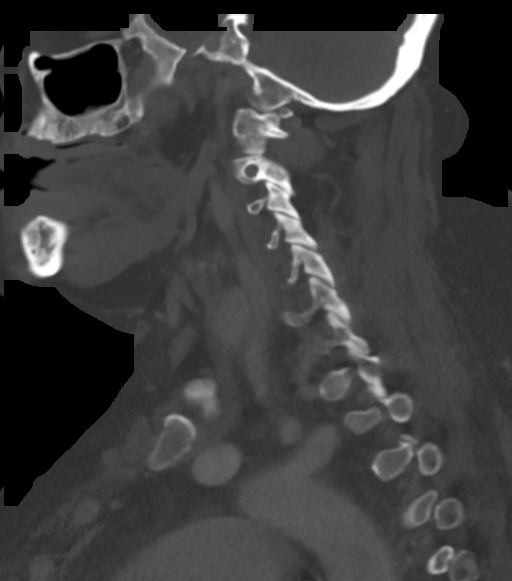
[im 74/111  bone]
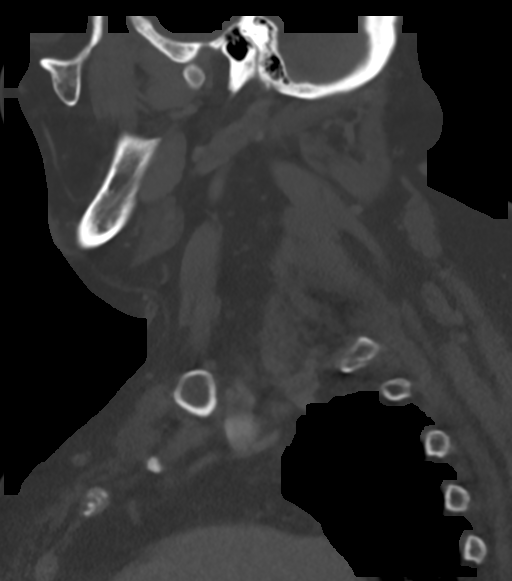

[Series 6: neck 2.0 st · coronal · 0.39mm/px · 3 of 101 slices shown (3 of 3)]
[im 21/101  bone]
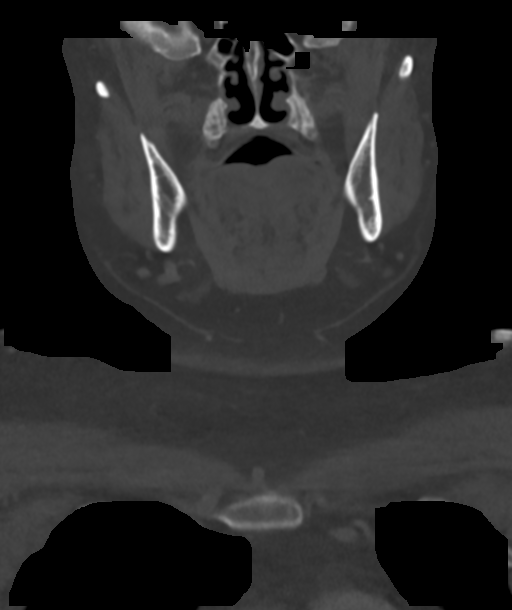
[im 41/101  bone]
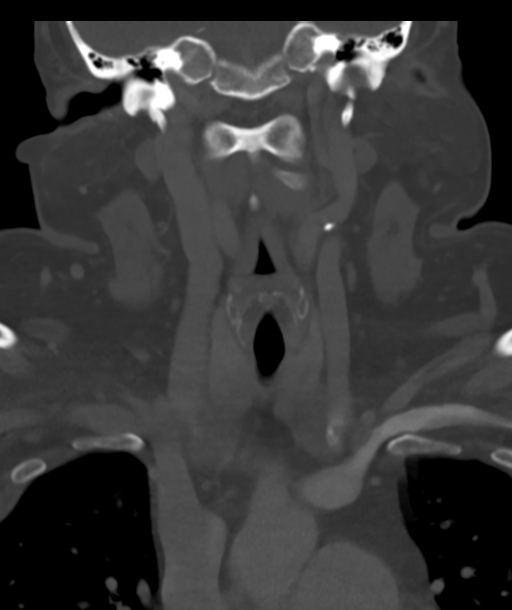
[im 61/101  bone]
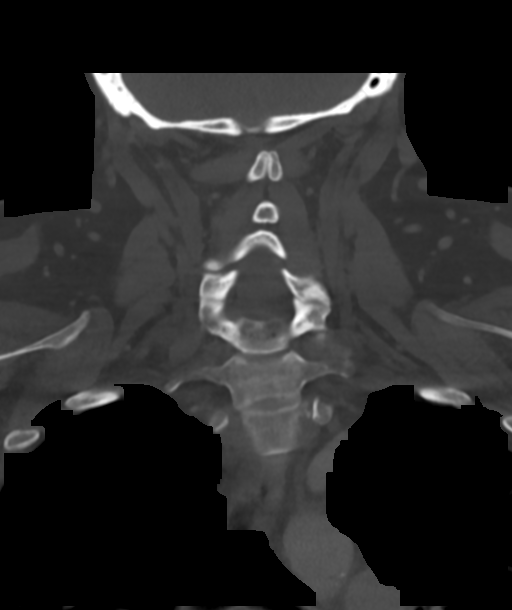

[Series 7: neck 2.0 st orthogonal · axial · 0.39mm/px · z∈[-251,-178]mm · 3 of 97 slices shown]
[im 20/97  bone]
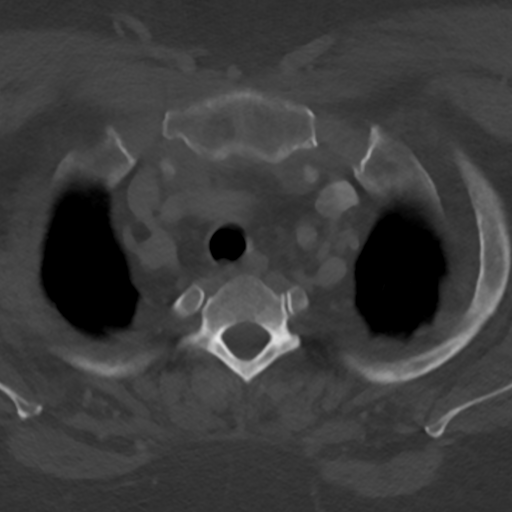
[im 39/97  bone]
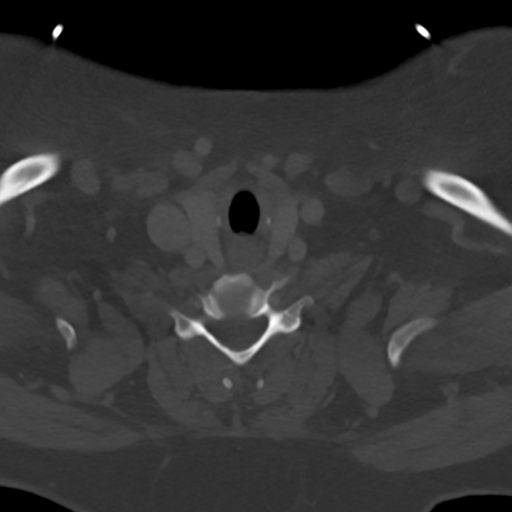
[im 58/97  bone]
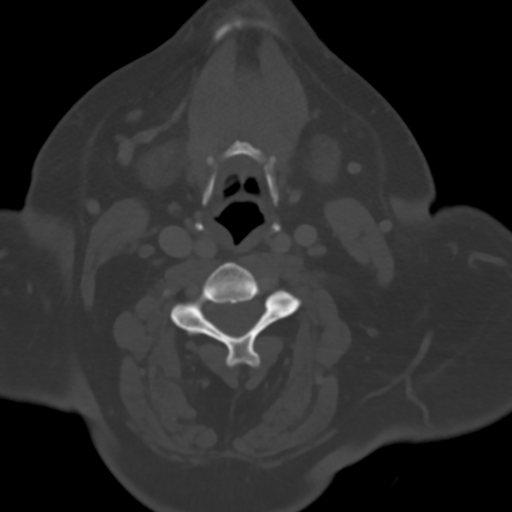

[15 of 33 positions shown; findings below may reference images not displayed]

FINDINGS: Partially visualized brain is unremarkable.

Minimal mucosal thickening within the partially visualized maxillary
sinuses, right greater than left. Partially visualized paranasal
sinuses are otherwise clear. Partially visualized mastoid air cells
are clear. Middle ear cavities are clear.

Salivary glands including the parotid glands and submandibular
glands are within normal limits.

Patient is edentulous. Oral cavity within normal limits. Palatine
tonsils unremarkable. Parapharyngeal fat preserved. Remainder of the
oropharynx and nasopharynx within normal limits. Epiglottis normal.
Vallecula clear. Minimal induration within the retropharyngeal fat,
which may reflect very mild pharyngitis. No frank retropharyngeal
fluid collection or abscess. Remainder of the hypopharynx and
supraglottic larynx within normal limits. True cords unremarkable.
Subglottic airway is clear.

Thyroid gland within normal limits.

No adenopathy within the neck.

Partially visualized superior mediastinum demonstrates no acute
abnormality.

Partially visualized lungs are clear.

Normal intravascular enhancement seen throughout the neck. Carotid
arteries are medialized into the retropharyngeal space bilaterally.
Mild atheromatous plaque about the carotid bifurcations bilaterally,
slightly greater on the left.

No acute osseous abnormality. No worrisome lytic or blastic osseous
lesions.
IMPRESSION: 1. Minimal induration/ inflammatory stranding within the
retropharyngeal fat, which may reflect very mild or early/developing
acute pharyngitis. No abscess or drainable fluid collection. Airway
is widely patent.
2. Otherwise negative CT of the neck.

## 2016-10-12 DIAGNOSIS — H25811 Combined forms of age-related cataract, right eye: Secondary | ICD-10-CM | POA: Diagnosis not present

## 2016-10-12 DIAGNOSIS — H2511 Age-related nuclear cataract, right eye: Secondary | ICD-10-CM | POA: Diagnosis not present

## 2016-10-12 IMAGING — CR DG CHEST 1V PORT
1 series · 1 of 1 positions shown · non-contrast
Comparison: 11/30/2015.

CLINICAL DATA: Shortness of breath. Chest tightness. Dry cough.
Sarcoidosis.

EXAM:
PORTABLE CHEST 1 VIEW

[AP]
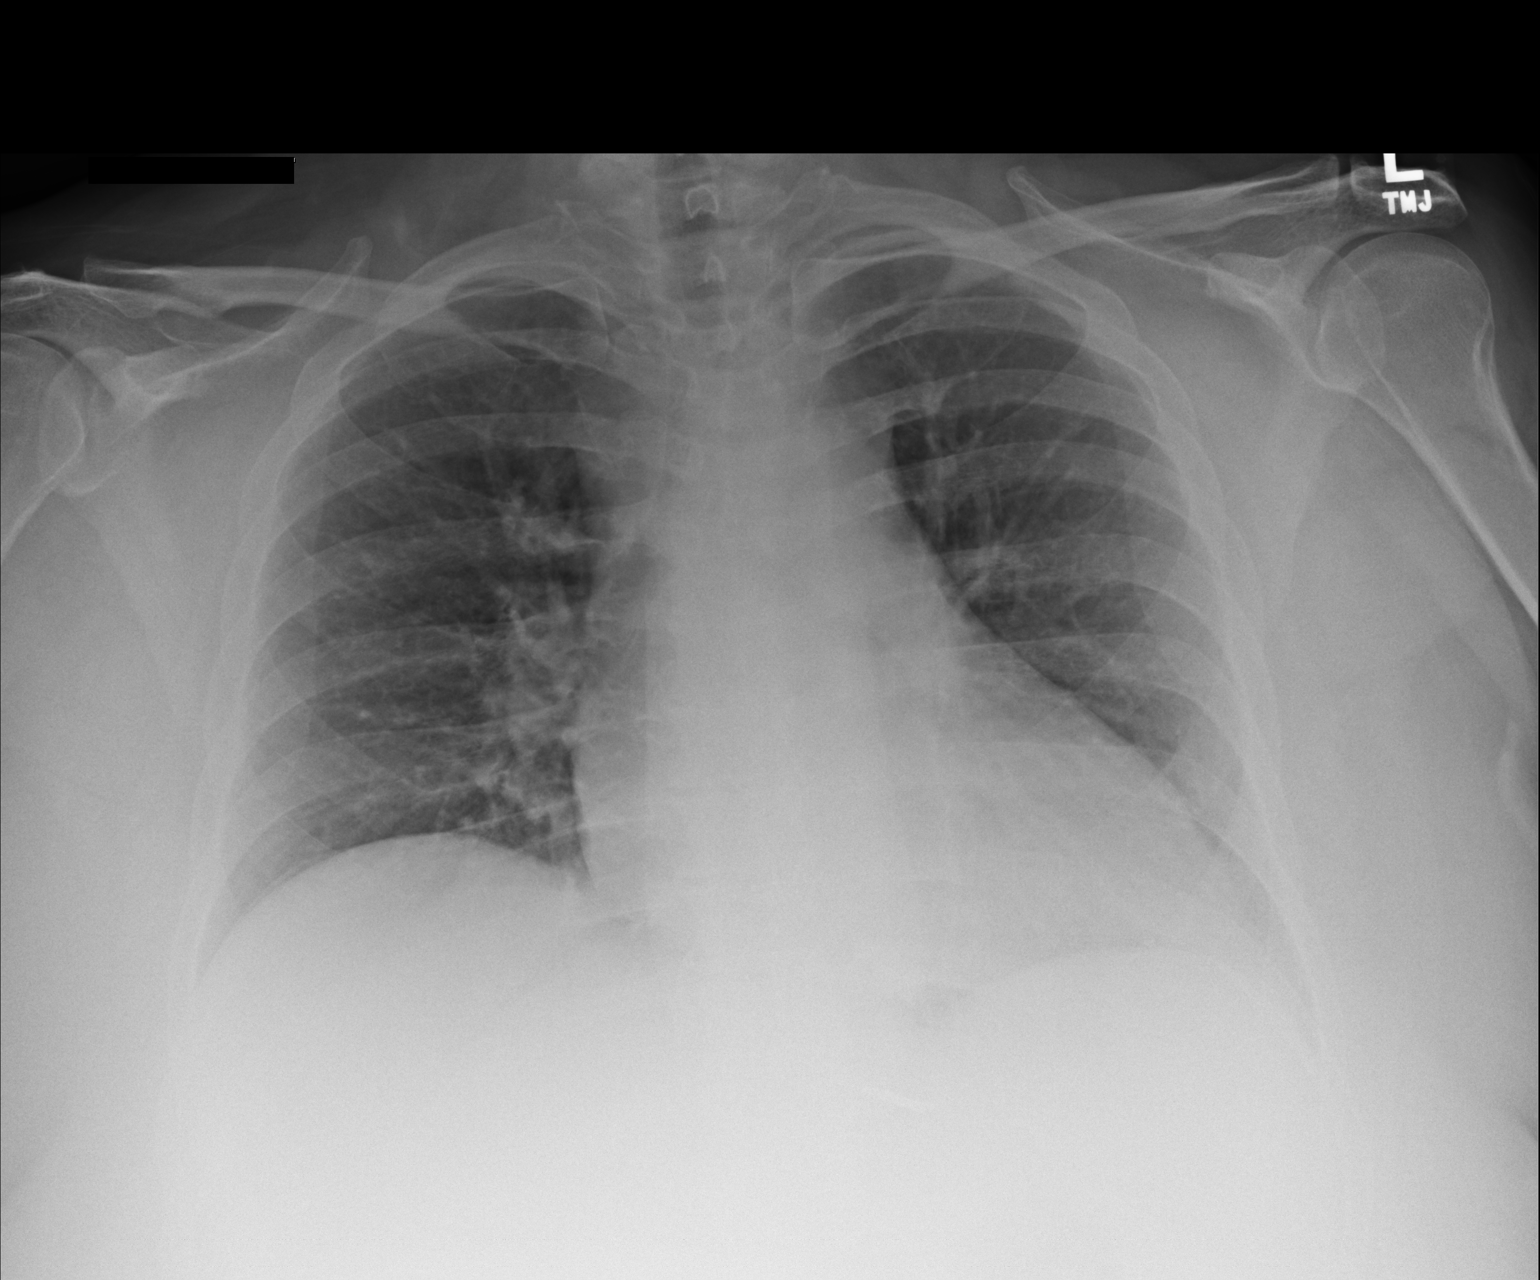

[1 of 1 positions shown; findings below may reference images not displayed]

FINDINGS: Stable enlarged cardiac silhouette. Mild central peribronchial
thickening with progression. Clear lungs. No pleural fluid. Mild
lower thoracic spine degenerative changes.
IMPRESSION: Mild bronchitic changes.

## 2016-10-14 ENCOUNTER — Ambulatory Visit (INDEPENDENT_AMBULATORY_CARE_PROVIDER_SITE_OTHER): Payer: BLUE CROSS/BLUE SHIELD | Admitting: Infectious Disease

## 2016-10-14 ENCOUNTER — Encounter: Payer: Self-pay | Admitting: Infectious Disease

## 2016-10-14 DIAGNOSIS — M86672 Other chronic osteomyelitis, left ankle and foot: Secondary | ICD-10-CM | POA: Diagnosis not present

## 2016-10-14 DIAGNOSIS — Z889 Allergy status to unspecified drugs, medicaments and biological substances status: Secondary | ICD-10-CM | POA: Diagnosis not present

## 2016-10-14 HISTORY — DX: Allergy status to unspecified drugs, medicaments and biological substances: Z88.9

## 2016-10-14 LAB — CBC WITH DIFFERENTIAL/PLATELET
Basophils Absolute: 0 cells/uL (ref 0–200)
Basophils Relative: 0 %
Eosinophils Absolute: 0 cells/uL — ABNORMAL LOW (ref 15–500)
Eosinophils Relative: 0 %
HCT: 36.3 % (ref 35.0–45.0)
Hemoglobin: 11.3 g/dL — ABNORMAL LOW (ref 11.7–15.5)
Lymphocytes Relative: 12 %
Lymphs Abs: 1920 cells/uL (ref 850–3900)
MCH: 27.7 pg (ref 27.0–33.0)
MCHC: 31.1 g/dL — ABNORMAL LOW (ref 32.0–36.0)
MCV: 89 fL (ref 80.0–100.0)
MPV: 9.1 fL (ref 7.5–12.5)
Monocytes Absolute: 320 cells/uL (ref 200–950)
Monocytes Relative: 2 %
Neutro Abs: 13760 cells/uL — ABNORMAL HIGH (ref 1500–7800)
Neutrophils Relative %: 86 %
Platelets: 451 10*3/uL — ABNORMAL HIGH (ref 140–400)
RBC: 4.08 MIL/uL (ref 3.80–5.10)
RDW: 14 % (ref 11.0–15.0)
WBC: 16 10*3/uL — ABNORMAL HIGH (ref 3.8–10.8)

## 2016-10-14 LAB — BASIC METABOLIC PANEL WITH GFR
BUN: 27 mg/dL — ABNORMAL HIGH (ref 7–25)
CO2: 27 mmol/L (ref 20–31)
Calcium: 9.7 mg/dL (ref 8.6–10.4)
Chloride: 93 mmol/L — ABNORMAL LOW (ref 98–110)
Creat: 1.16 mg/dL — ABNORMAL HIGH (ref 0.50–1.05)
GFR, Est African American: 60 mL/min (ref >=60)
GFR, Est Non African American: 52 mL/min — ABNORMAL LOW (ref >=60)
Glucose, Bld: 311 mg/dL — ABNORMAL HIGH (ref 65–99)
Potassium: 4.7 mmol/L (ref 3.5–5.3)
Sodium: 136 mmol/L (ref 135–146)

## 2016-10-14 NOTE — Progress Notes (Signed)
Chief complaint: followup for calcaneal osteomyelitis  Subjective:    Patient ID: [name], female    DOB: 07-May-1961, 56 y.o.   MRN: [id number]  HPI  56 year old female with multiple medical problems who initially had a fracture of her 5th metatarsal and developed non union. Underwent ORIF with bone grafting but then fell and broke the 5th metatarsal again. She underwent hardware removal and ORIF again  with midfoot reconstruction. The calcaneal osteotomy did not heal well and in May 2016 noted draiange, which she describes as white, pus like drainage. Developed swelling of foot and leg. She was started on Septra DS, 2 pills bid with rifampin though did not resolve. Underwent I and D in [place] with associated hardware removal but presented here to Dr. Percell Miller with persistent drainage. She underwent I and D and superficial swab with MRSA and CoNS, no bone culture done. She was started on and has continued with vancomycin and rifampin. Unfortunately, she developed AKI and was reshospitalized. Her creat was increased to 1.87 and vancomycin stopped and started linezolid. Her creat remained stable, though still up from her baseline at 1.68 after her visit to Dr. Yong Channel. She was sent out on doxycyline after her insurace refused linezolid coverage. She developed a rash with this but continued the doxycyline, until seen by Dr. Linus Salmons roughly 16 days ago. She was taken off doxycycline but since has seen Dr. Para March who is concerned that the patient had worsening left heel pain which is hard for her now to bear. Patient had labs done which showed an elevated sedimentation rate above 50. 4 to layer creatinine had now improved to 1.35. See labs of lobe picture.  She had  been restarted on doxycycline and has had re-emergence of her rash is intensely pruritic  When I later saw  her I was concerned for worsening osteomyelitis and obtained MRI of foot on 06/03/15 which showed:  IMPRESSION: 1.  There is evidence of prior calcaneal osteotomy of the left posterior calcaneus with hardware removal. There is a soft tissue wound along the posterior lateral aspect of the left hindfoot extending to the lateral osteotomy site of the calcaneus. There is soft tissue enhancement and osseous enhancement with mild cortical irregularity along the lateral aspect of the calcaneal osteotomy site which may reflect postsurgical granulation tissue, but osteomyelitis cannot be completely excluded. No drainable fluid collection.  She was seen b y Dr. Sharol Given with orthopedic surgery. I had stopped her doxycyline in hopes that we might obtain helpful cultures in the OR to guide therapy if she underwent surgery   She  was seen by Dr Sharol Given who restarted her oral doxycycline and then admitted her to [facility] where he performed Partial Excision Left Calcaneus, Place Antibiotic Beads, and Wound VAC on 06/27/15. The patient states that she has not been on ANY antibiotics postoperatively whatsoever. Her cultures on doxycycline did not yield any organism. She has NOT had worsening of pain or drainage since her surgery which was a month prior to my seeing her again. I DID propose however that she undergo course of rx with IV antibiotics to maximize her chances of control, cure of this calcaneal osteomyelitis and we started IV daptomicin to which she apparently developed a rash. This was stopped and she was changed to IV teflaro which she also developed a rash. She was NOT able to get through the 8 weeks of IV abx we had planned instead roughly 32 days of IV antibiotics. We  did not switch her to an oral antibiotic due to her multiple allergies.   Since coming off antibiotics she had experienced worsening pain in her heel both with weight bearing and at rest. No nausea vomiting, fevers or systemic symptoms.  Her inflammatory markers were up and we were worried and therefore he obtained an MRI in mid December which  showed:   IMPRESSION: 1. Interval progressive healing of calcaneal osteotomy. No evidence of osteomyelitis. 2. The lateral hindfoot soft tissue wound/tract demonstrates mild contraction. No evidence of soft tissue abscess. 3. Peroneal tenosynovitis and subcutaneous edema in the distal lower leg have mildly worsened.   She has had interval improvement in her wound and pain in her heel. She did have an admission to the hospital for a flare of her sarcoidosis that is not just involving her lungs but her eyes she is going to [facility] to see Dr. Casper Harrison for follow-up for this. She is also being seen by Dr. Prudencio Burly here in [place] with regards to the ophthalmic involvement.  Her inflammatory markers were elevated when she was inpatient but is concerned well be due to her sarcoid.  He has been off antibiotics for some time and continues to be off antibiotics for heel pain has improved.  We will plan on checking inflammatory markers today if reassuring come  back as needed      Past Medical History:  Diagnosis Date  . Abnormal SPEP 04/17/2014  . ANEMIA-UNSPECIFIED 09/18/2009  . CHF (congestive heart failure) ([place])   . Chronic diastolic heart failure, NYHA class 2 (HCC)    LVEDP roughly 20% by cath  . COPD (chronic obstructive pulmonary disease) ([place])   . Depression   . DIABETES MELLITUS, TYPE II 08/21/2006  . Diabetic osteomyelitis ([place]) 05/29/2015  . Exertional chest pain    sharp, substernal, exertional  . Fracture of 5th metatarsal    non union  . GERD 08/21/2006  . GOUT 08/20/2010  . Hx of umbilical hernia repair   . HYPERLIPIDEMIA 08/21/2006  . HYPERTENSION 08/21/2006  . Infection of wound due to methicillin resistant Staphylococcus aureus (MRSA)   . Internal hemorrhoids   . OBESITY 06/04/2009  . Onychomycosis 10/27/2015  . Osteomyelitis of left foot ([place]) 05/29/2015  . Pulmonary sarcoidosis ([place])    Followed locally by pulmonology, but also by Dr. Casper Harrison at [facility]  Pulmonary Medicine  . Vocal cord dysfunction   . Wears partial dentures     Past Surgical History:  Procedure Laterality Date  . ABDOMINAL HYSTERECTOMY    . APPENDECTOMY    . BLADDER SUSPENSION  11/11/2011   Procedure: TRANSVAGINAL TAPE (TVT) PROCEDURE;  Surgeon: Olga Millers, MD;  Location: [facility];  Service: Gynecology;  Laterality: N/A;  . CARDIAC CATHETERIZATION  07/2010   LVEF 50-55% WITH VERY MILD GLOBAL HYPOKINESIA; ESSENTIALLY NORMAL CORONARY ARTERIES; NORMAL LV FUNCTION  . CAROTIDS  02/18/11   CAROTID DUPLEX; VERTEBRALS ARE PATENT WITH ANTEGRADE FLOW. ICA/CCA RATIO 1.61 ON RIGHT AND 0.75 ON LEFT  . CHOLECYSTECTOMY  1984  . CYSTOSCOPY  11/11/2011   Procedure: CYSTOSCOPY;  Surgeon: Olga Millers, MD;  Location: [facility];  Service: Gynecology;  Laterality: N/A;  . DOPPLER ECHOCARDIOGRAPHY  02/12/2013   LV FUNCTION, SIZE NORMAL; MILD CONCENTRIC LVH; EST EF 55-65%; WALL MOTION NORMAL  . FRACTURE SURGERY     foot  . HERNIA REPAIR    . I&D EXTREMITY Left 06/27/2015   Procedure: Partial Excision Left Calcaneus, Place Antibiotic Beads, and Wound VAC;  Surgeon: Nadara Mustard, MD;  Location: [facility] OR;  Service: Orthopedics;  Laterality: Left;  . KNEE ARTHROSCOPY     right  . LEFT AND RIGHT HEART CATHETERIZATION WITH CORONARY ANGIOGRAM N/A 04/23/2013   Procedure: LEFT AND RIGHT HEART CATHETERIZATION WITH CORONARY ANGIOGRAM;  Surgeon: Marykay Lex, MD;  Location: [facility] C[facility] LAB;  Service: Cardiovascular;  Laterality: N/A;  . LexiScan Myoview  03/09/2013   EF 50%; NORMAL MYOCARDIAL PERFUSION STUDY - breast attenuation  . METATARSAL OSTEOTOMY WITH OPEN REDUCTION INTERNAL FIXATION (ORIF) METATARSAL WITH FUSION Left 04/09/2014   Procedure: LEFT FOOT FRACTURE OPEN TREATMENT METATARSAL INCLUDES INTERNAL FIXATION EACH;  Surgeon: Nilda Simmer, MD;  Location: [facility];  Service: Orthopedics;  Laterality: Left;  . NISSEN FUNDOPLICATION  2004  . Right and left CARDIAC CATHETERIZATION   04/23/2013   Angiographic normal coronaries; LVEDP 20 mmHg, PCWP 12-14 mmHg, RAP 12 mmHg.; Fick CO/CI 4.9/2.2  . TUBAL LIGATION     with reversal in 1994  . VENTRAL HERNIA REPAIR      Family History  Problem Relation Age of Onset  . Diabetes Father   . Heart attack Father   . Coronary artery disease Father   . Heart failure Father   . COPD Mother   . Emphysema Mother   . Asthma Mother   . Heart failure Mother   . Heart attack Maternal Grandfather   . Sarcoidosis Maternal Uncle   . Lung cancer Brother   . Diabetes Brother   . Colon cancer Neg Hx       Social History   Social History  . Marital status: Married    Spouse name: MERIDITH ROMICK  . Number of children: 2  . Years of education: 12   Occupational History  . DISABLED    Social History Main Topics  . Smoking status: Never Smoker  . Smokeless tobacco: Never Used  . Alcohol use No  . Drug use: No  . Sexual activity: Yes    Birth control/ protection: Surgical   Other Topics Concern  . None   Social History Narrative   Married 1994. 2 sons who both live close and 1 grandson.       Disability due to sarcoidosis. 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 Methotrexate Other (See Comments)     peri-oral and buccal lesions.  . Vancomycin Other (See Comments)    nephrotoxicity  . Clindamycin/Lincomycin Nausea And Vomiting and Rash  . Chlorhexidine Itching  . Doxycycline Rash  . Lisinopril Cough  . Teflaro [Ceftaroline] Rash     Current Outpatient Prescriptions:  .  acetaminophen (TYLENOL) 325 MG tablet, Take 650 mg by mouth every 6 (six) hours as needed for headache. Reported on 01/05/2016, Disp: , Rfl:  .  albuterol (PROVENTIL HFA;VENTOLIN HFA) 108 (90 BASE) MCG/ACT inhaler, Inhale 1-2 puffs into the lungs every 6 (six) hours  as needed for wheezing or shortness of breath., Disp: 8 g, Rfl: 5 .  albuterol (PROVENTIL) (2.5 MG/3ML) 0.083% nebulizer solution, Take 3 mLs (2.5 mg total) by nebulization every 6 (six) hours as needed for wheezing or shortness of breath., Disp: 150 mL, Rfl: 6 .  allopurinol (ZYLOPRIM) 100 MG tablet, Take 100 mg by mouth at bedtime. , Disp: , Rfl:  .  aspirin  EC 81 MG tablet, Take 81 mg by mouth daily. , Disp: , Rfl:  .  atorvastatin (LIPITOR) 40 MG tablet, TAKE 1 TAB BY MOUTH ONCE DAILY, Disp: 90 tablet, Rfl: 3 .  benzonatate (TESSALON) 100 MG capsule, Take 100 mg by mouth 3 (three) times daily as needed for cough. Reported on 01/05/2016, Disp: , Rfl:  .  buPROPion (WELLBUTRIN XL) 300 MG 24 hr tablet, Take 1 tablet (300 mg total) by mouth daily., Disp: 90 tablet, Rfl: 3 .  carvedilol (COREG) 25 MG tablet, Take 1 tablet (25 mg total) by mouth 2 (two) times daily with a meal., Disp: 180 tablet, Rfl: 3 .  chlorpheniramine-HYDROcodone (TUSSIONEX) 10-8 MG/5ML SUER, Take 5 mLs by mouth daily as needed for cough. Reported on 01/05/2016, Disp: , Rfl: 0 .  cholecalciferol 5000 UNITS TABS, Take 1 tablet (5,000 Units total) by mouth daily. (Patient taking differently: Take 5,000 Units by mouth every morning. ), Disp: 30 tablet, Rfl: 3 .  cloNIDine (CATAPRES) 0.1 MG tablet, Take 0.1 mg by mouth., Disp: , Rfl:  .  DEXILANT 60 MG capsule, TAKE ONE CAPSULE BY MOUTH EVERY DAY, Disp: 30 capsule, Rfl: 5 .  diltiazem (CARDIZEM CD) 120 MG 24 hr capsule, Take 1 capsule (120 mg total) by mouth daily. PLEASE CONTACT OFFICE FOR ADDITIONAL REFILLS, Disp: 30 capsule, Rfl: 0 .  estradiol (ESTRACE) 2 MG tablet, Take 2 mg by mouth at bedtime. , Disp: , Rfl:  .  fenofibrate (TRICOR) 145 MG tablet, Take 1 tablet (145 mg total) by mouth daily. PLEASE CONTACT OFFICE FOR ADDITIONAL REFILLS, Disp: 30 tablet, Rfl: 0 .  fluticasone (FLONASE) 50 MCG/ACT nasal spray, Place 2 sprays into both nostrils daily., Disp: 48 g, Rfl: 0 .   furosemide (LASIX) 40 MG tablet, TAKE 1 TABLET BY MOUTH TWICE A DAY, Disp: 60 tablet, Rfl: 3 .  gabapentin (NEURONTIN) 100 MG capsule, TAKE 1 CAPSULE (100 MG TOTAL) BY MOUTH 3 (THREE) TIMES DAILY., Disp: 90 capsule, Rfl: 0 .  glucose blood (ONE TOUCH ULTRA TEST) test strip, 1 each by Other route 3 (three) times daily. Use as instructed, Disp: 100 each, Rfl: 5 .  HYDROcodone-acetaminophen (NORCO/VICODIN) 5-325 MG tablet, 1 tablet by mouth 2-3 times a day as needed for pain, Disp: , Rfl: 0 .  insulin aspart (NOVOLOG FLEXPEN) 100 UNIT/ML FlexPen, Inject 12-20 Units into the skin 3 (three) times daily with meals. Per sliding scale, Disp: 30 mL, Rfl: 5 .  Insulin Glargine (TOUJEO SOLOSTAR) 300 UNIT/ML SOPN, Inject 45 Units into the skin daily., Disp: 5 pen, Rfl: 5 .  Insulin Pen Needle 33G X 4 MM MISC, 1 each by Does not apply route 4 (four) times daily., Disp: 200 each, Rfl: 11 .  LORazepam (ATIVAN) 1 MG tablet, Take 1 mg by mouth., Disp: , Rfl:  .  metFORMIN (GLUCOPHAGE) 1000 MG tablet, TAKE ONE TABLET BY MOUTH TWICE DAILY WITH MEALS, Disp: 180 tablet, Rfl: 0 .  mometasone-formoterol (DULERA) 200-5 MCG/ACT AERO, Inhale 2 puffs into the lungs 2 (two) times daily., Disp: 13 g, Rfl: 5 .  nitroGLYCERIN (NITROSTAT) 0.4 MG SL tablet, Place 0.4 mg under the tongue every 5 (five) minutes as needed. Reported on 01/05/2016, Disp: , Rfl:  .  ondansetron (ZOFRAN-ODT) 4 MG disintegrating tablet, Take 4 mg by mouth every 8 (eight) hours as needed for nausea or vomiting. Reported on 01/05/2016, Disp: , Rfl: 0 .  PARoxetine Mesylate (BRISDELLE) 7.5 MG CAPS, Take 7.5 mg by mouth daily., Disp: ,  Rfl:  .  Polyethyl Glycol-Propyl Glycol (SYSTANE) 0.4-0.3 % SOLN, Apply 1 drop to eye at bedtime., Disp: , Rfl:  .  potassium chloride SA (K-DUR,KLOR-CON) 20 MEQ tablet, Take 1 tablet (20 mEq total) by mouth 3 (three) times daily., Disp: 90 tablet, Rfl: 1 .  PREDNISONE PO, Take 5 mg by mouth daily. 20 mg through 10/15/16, Disp: , Rfl:   .  terbinafine (LAMISIL) 250 MG tablet, Take 1 tablet (250 mg total) by mouth daily., Disp: 30 tablet, Rfl: 3 .  triamcinolone cream (KENALOG) 0.1 %, Apply topically., Disp: , Rfl:  .  venlafaxine XR (EFFEXOR-XR) 75 MG 24 hr capsule, Take 2 capsules (150 mg total) by mouth at bedtime., Disp: , Rfl:  .  vitamin B-12 (CYANOCOBALAMIN) 1000 MCG tablet, Take 1,000 mcg by mouth daily., Disp: , Rfl:  .  Calcium Citrate 200 MG TABS, Take by mouth., Disp: , Rfl:  .  glyBURIDE (DIABETA) 2.5 MG tablet, Take 2.5 mg by mouth., Disp: , Rfl:  .  losartan (COZAAR) 50 MG tablet, TAKE 1 TABLET (50 MG TOTAL) BY MOUTH DAILY., Disp: , Rfl: 3 .  moxifloxacin (VIGAMOX) 0.5 % ophthalmic solution, INSTILL 1 DROP IN RIGHT EYE 4 X DAILY STARTING 2 DAYS PRIOR TO SURGERY& 2 DROPS MORNING OF SURGERY, Disp: , Rfl: 0 .  omeprazole (PRILOSEC) 20 MG capsule, Take 20 mg by mouth., Disp: , Rfl:  .  predniSONE (DELTASONE) 20 MG tablet, TAKE 3 TABLETS BY MOUTH EVERY MORNING X 3 DAYS, THEN 2 TABLETS X 3 DAYS, THEN 1 TABLET X 3 DAYS, Disp: , Rfl: 0      Review of Systems  Constitutional: Negative for activity change, appetite change, chills, diaphoresis, fatigue, fever and unexpected weight change.  HENT: Negative for congestion, rhinorrhea, sinus pressure, sneezing, sore throat and trouble swallowing.   Eyes: Negative for photophobia and visual disturbance.  Respiratory: Negative for cough, chest tightness, shortness of breath, wheezing and stridor.   Cardiovascular: Negative for chest pain, palpitations and leg swelling.  Gastrointestinal: Negative for abdominal distention, abdominal pain, anal bleeding, blood in stool, constipation, diarrhea, nausea and vomiting.  Genitourinary: Negative for difficulty urinating, dysuria, flank pain and hematuria.  Musculoskeletal: Positive for arthralgias. Negative for back pain, gait problem, joint swelling and myalgias.  Skin: Positive for rash. Negative for color change, pallor and wound.   Neurological: Negative for dizziness, tremors, weakness and light-headedness.  Hematological: Negative for adenopathy. Does not bruise/bleed easily.  Psychiatric/Behavioral: Negative for agitation, behavioral problems, confusion, decreased concentration, dysphoric mood and sleep disturbance.       Objective:   Physical Exam  Constitutional: She is oriented to person, place, and time. She appears well-developed and well-nourished. No distress.  HENT:  Head: Normocephalic and atraumatic.  Mouth/Throat: No oropharyngeal exudate.  Eyes: Conjunctivae and EOM are normal. No scleral icterus.  Neck: Normal range of motion. Neck supple.  Cardiovascular: Normal rate and regular rhythm.   Pulmonary/Chest: Effort normal. No respiratory distress. She has no wheezes.  Abdominal: She exhibits no distension.  Musculoskeletal: She exhibits no edema or tenderness.  Neurological: She is alert and oriented to person, place, and time. She exhibits normal muscle tone. Coordination normal.  Skin: Skin is warm and dry. She is not diaphoretic. No erythema.  Psychiatric: She has a normal mood and affect. Her behavior is normal. Judgment and thought content normal.     Left foot 05/29/2015:    Left heel today 01/26/16:       Heel continues to appear  well-healed     Left foot 09/11/15: note a new scar from where she dropped a curling iron on her foot       10/14/16:       Right foot:          Assessment & Plan:   56 year old with calcaneal osteomyelitis and metatarsal infection originally due to fracture also with diabetes mellitus. Above infection was complicated by presence of hardware. MRI in August showed osteomyelitis persisting sp surgery by Dr Sharol Given with deep cultures (though ON abx) and culture unrevealing. We tried IV abx but only got through 33 of planned 56 days due to rashes to every IV abx we had tried.   MRI was encouraging and she had done well off  antibiotics.  Her heel pain has improved and no evidence of systemic infection  Recheck ESR, CRP, RTC PRN    Multiple allergies on various antibiotics: wonder if many of these are driven by supratentorial or in fact subconscious suggestion or if she ireally is allergic to all of these different classes of antibiotics

## 2016-10-15 LAB — C-REACTIVE PROTEIN: CRP: 24 mg/L — ABNORMAL HIGH (ref <8.0)

## 2016-10-15 LAB — SEDIMENTATION RATE: Sed Rate: 27 mm/hr (ref 0–30)

## 2016-10-18 DIAGNOSIS — Z20818 Contact with and (suspected) exposure to other bacterial communicable diseases: Secondary | ICD-10-CM | POA: Diagnosis not present

## 2016-10-18 DIAGNOSIS — I10 Essential (primary) hypertension: Secondary | ICD-10-CM | POA: Diagnosis not present

## 2016-10-18 DIAGNOSIS — J029 Acute pharyngitis, unspecified: Secondary | ICD-10-CM | POA: Diagnosis not present

## 2016-10-18 DIAGNOSIS — H6692 Otitis media, unspecified, left ear: Secondary | ICD-10-CM | POA: Diagnosis not present

## 2016-10-20 ENCOUNTER — Telehealth: Payer: Self-pay | Admitting: *Deleted

## 2016-10-20 NOTE — Telephone Encounter (Signed)
Well but lab is indeed worse but the sedimentation rate is stable to improved does she have a follow-up appointment with [place] I don't echo would rush to do anything else like imager at this point in time since her heel seems to be doing well.

## 2016-10-20 NOTE — Telephone Encounter (Signed)
Patient called to get her results of lab work done 10/14/16. Advised her will ask the doctor and give her a call back once he responds.

## 2016-10-21 NOTE — Telephone Encounter (Signed)
Spoke with patient, relayed information. She verbalized understanding, accepted an appointment 2/27 for follow up. Landis Gandy, RN

## 2016-11-02 ENCOUNTER — Other Ambulatory Visit: Payer: Self-pay | Admitting: Internal Medicine

## 2016-11-02 ENCOUNTER — Other Ambulatory Visit: Payer: Self-pay | Admitting: Cardiology

## 2016-11-02 NOTE — Telephone Encounter (Signed)
REFILL 

## 2016-11-08 DIAGNOSIS — H43811 Vitreous degeneration, right eye: Secondary | ICD-10-CM | POA: Diagnosis not present

## 2016-11-10 ENCOUNTER — Other Ambulatory Visit: Payer: Self-pay | Admitting: Family Medicine

## 2016-11-13 ENCOUNTER — Other Ambulatory Visit: Payer: Self-pay | Admitting: Cardiology

## 2016-11-15 NOTE — Telephone Encounter (Signed)
Rx(s) sent to pharmacy electronically.  

## 2016-11-19 ENCOUNTER — Encounter: Payer: Self-pay | Admitting: Cardiology

## 2016-11-19 ENCOUNTER — Ambulatory Visit: Payer: BLUE CROSS/BLUE SHIELD | Admitting: Family Medicine

## 2016-11-19 ENCOUNTER — Ambulatory Visit (INDEPENDENT_AMBULATORY_CARE_PROVIDER_SITE_OTHER): Payer: BLUE CROSS/BLUE SHIELD | Admitting: Cardiology

## 2016-11-19 VITALS — BP 126/72 | HR 70 | Ht 66.0 in | Wt 244.6 lb

## 2016-11-19 DIAGNOSIS — I110 Hypertensive heart disease with heart failure: Secondary | ICD-10-CM | POA: Diagnosis not present

## 2016-11-19 DIAGNOSIS — I5032 Chronic diastolic (congestive) heart failure: Secondary | ICD-10-CM

## 2016-11-19 DIAGNOSIS — E781 Pure hyperglyceridemia: Secondary | ICD-10-CM

## 2016-11-19 DIAGNOSIS — I10 Essential (primary) hypertension: Secondary | ICD-10-CM

## 2016-11-19 NOTE — Patient Instructions (Signed)
NO CHANGES WITH CURRENT MEDICATIONS   Your physician wants you to follow-up in 12 MONTHS WITH DR HARDING. You will receive a reminder letter in the mail two months in advance. If you don't receive a letter, please call our office to schedule the follow-up appointment.   If you need a refill on your cardiac medications before your next appointment, please call your pharmacy.  

## 2016-11-19 NOTE — Progress Notes (Signed)
PCP: Garret Reddish, MD  Pulmonolgy: Dr. Nori Riis [facility]) ; [place] - Dr. Concepcion Living  Clinic Note: Chief Complaint  Patient presents with  . Follow-up    HFPEF - mild (but more related to Pulmonary Sarcoidosis)  . Leg Pain    cramping in legs at night.    HPI: [name] is a 56 y.o. female with a PMH below who presents today for 1 year follow-up for her mild diastolic heart failure/hypertensive heart disease with pulmonary sarcoidosis. She was initially seen back in 2014 and had a right left heart cath done. Angiographic normal coronary arteries with essentially normal right heart pressures.[name] was last seen in February 2017, she was doing relatively well and as needed her medications refilled. She had a baseline dyspnea from her pulmonary disease but no chest tightness symptoms.  Recent Hospitalizations: No hospitalizations, but has had some flareups of bronchitis.  Studies Reviewed: None  Interval History: [name] continues to do well from a cardiac standpoint. She of course has her baseline dyspnea, but has not had a flare of her sarcoidosis in quite some time. She has not really noted any significant heart failure symptoms of PND orthopnea or edema. She's had a couple bouts of bronchitis and has some discomfort in her chest during these episodes, but they have been much less severe of late. She has chronic mild lower TIMI edema but no PND or orthopnea.   No chest pain or pressure with rest or exertion. She does have some shortness of breath with exertion, but she is somewhat deconditioned with underlying pulmonary disease. No palpitations, lightheadedness, dizziness, weakness or syncope/near syncope. No TIA/amaurosis fugax symptoms. No claudication.  ROS: A comprehensive was performed. Review of Systems  Constitutional: Negative for chills, fever and malaise/fatigue (Feeling better, has not had a bout of bronchitis in a while.).  HENT: Negative for congestion and  nosebleeds.   Respiratory: Positive for cough (Minimal now) and shortness of breath (Baseline).   Cardiovascular:       Per history of present illness  Gastrointestinal: Negative for abdominal pain, blood in stool and melena.  Genitourinary: Negative for hematuria.  Musculoskeletal: Positive for joint pain.  Skin: Negative for rash.  Neurological: Negative.   Psychiatric/Behavioral: Negative for memory loss. The patient is not nervous/anxious and does not have insomnia.   All other systems reviewed and are negative.   Past Medical History:  Diagnosis Date  . Abnormal SPEP 04/17/2014  . ANEMIA-UNSPECIFIED 09/18/2009  . CHF (congestive heart failure) ([place])   . Chronic diastolic heart failure, NYHA class 2 (HCC)    LVEDP roughly 20% by cath  . COPD (chronic obstructive pulmonary disease) ([place])   . Depression   . DIABETES MELLITUS, TYPE II 08/21/2006  . Diabetic osteomyelitis ([place]) 05/29/2015  . Exertional chest pain    sharp, substernal, exertional  . Fracture of 5th metatarsal    non union  . GERD 08/21/2006  . GOUT 08/20/2010  . Hx of umbilical hernia repair   . HYPERLIPIDEMIA 08/21/2006  . HYPERTENSION 08/21/2006  . Infection of wound due to methicillin resistant Staphylococcus aureus (MRSA)   . Internal hemorrhoids   . Multiple allergies 10/14/2016  . Multiple allergies 10/14/2016  . OBESITY 06/04/2009  . Onychomycosis 10/27/2015  . Osteomyelitis of left foot ([place]) 05/29/2015  . Pulmonary sarcoidosis ([place])    Followed locally by pulmonology, but also by Dr. Casper Harrison at [facility]  . Vocal cord dysfunction   . Wears partial dentures  Past Surgical History:  Procedure Laterality Date  . ABDOMINAL HYSTERECTOMY    . APPENDECTOMY    . BLADDER SUSPENSION  11/11/2011   Procedure: TRANSVAGINAL TAPE (TVT) PROCEDURE;  Surgeon: Olga Millers, MD;  Location: [facility];  Service: Gynecology;  Laterality: N/A;  . CARDIAC CATHETERIZATION  07/2010   LVEF 50-55% WITH VERY MILD  GLOBAL HYPOKINESIA; ESSENTIALLY NORMAL CORONARY ARTERIES; NORMAL LV FUNCTION  . CAROTIDS  02/18/11   CAROTID DUPLEX; VERTEBRALS ARE PATENT WITH ANTEGRADE FLOW. ICA/CCA RATIO 1.61 ON RIGHT AND 0.75 ON LEFT  . CHOLECYSTECTOMY  1984  . CYSTOSCOPY  11/11/2011   Procedure: CYSTOSCOPY;  Surgeon: Olga Millers, MD;  Location: [facility];  Service: Gynecology;  Laterality: N/A;  . DOPPLER ECHOCARDIOGRAPHY  02/12/2013   LV FUNCTION, SIZE NORMAL; MILD CONCENTRIC LVH; EST EF 55-65%; WALL MOTION NORMAL  . FRACTURE SURGERY     foot  . HERNIA REPAIR    . I&D EXTREMITY Left 06/27/2015   Procedure: Partial Excision Left Calcaneus, Place Antibiotic Beads, and Wound VAC;  Surgeon: Newt Minion, MD;  Location: [facility];  Service: Orthopedics;  Laterality: Left;  . KNEE ARTHROSCOPY     right  . LEFT AND RIGHT HEART CATHETERIZATION WITH CORONARY ANGIOGRAM N/A 04/23/2013   Procedure: LEFT AND RIGHT HEART CATHETERIZATION WITH CORONARY ANGIOGRAM;  Surgeon: Leonie Man, MD;  Location[facility];  Service: Cardiovascular;  Laterality: N/A;  . LexiScan Myoview  03/09/2013   EF 50%; NORMAL MYOCARDIAL PERFUSION STUDY - breast attenuation  . METATARSAL OSTEOTOMY WITH OPEN REDUCTION INTERNAL FIXATION (ORIF) METATARSAL WITH FUSION Left 04/09/2014   Procedure: LEFT FOOT FRACTURE OPEN TREATMENT METATARSAL INCLUDES INTERNAL FIXATION EACH;  Surgeon: Lorn Junes, MD;  Location: [facility];  Service: Orthopedics;  Laterality: Left;  . NISSEN FUNDOPLICATION  [id number]  . Right and left CARDIAC CATHETERIZATION  04/23/2013   Angiographic normal coronaries; LVEDP 20 mmHg, PCWP 12-14 mmHg, RAP 12 mmHg.; Fick CO/CI 4.9/2.2  . TUBAL LIGATION     with reversal in 1994  . VENTRAL HERNIA REPAIR      Current Meds  Medication Sig  . acetaminophen (TYLENOL) 325 MG tablet Take 650 mg by mouth every 6 (six) hours as needed for headache. Reported on 01/05/2016  . albuterol (PROVENTIL HFA;VENTOLIN HFA) 108 (90 BASE) MCG/ACT inhaler  Inhale 1-2 puffs into the lungs every 6 (six) hours as needed for wheezing or shortness of breath.  [name] albuterol (PROVENTIL) (2.5 MG/3ML) 0.083% nebulizer solution Take 3 mLs (2.5 mg total) by nebulization every 6 (six) hours as needed for wheezing or shortness of breath.  . allopurinol (ZYLOPRIM) 100 MG tablet Take 100 mg by mouth at bedtime.   [name] aspirin EC 81 MG tablet Take 81 mg by mouth daily.   [name] atorvastatin (LIPITOR) 40 MG tablet TAKE 1 TAB BY MOUTH ONCE DAILY  . benzonatate (TESSALON) 100 MG capsule Take 100 mg by mouth 3 (three) times daily as needed for cough. Reported on 01/05/2016  . buPROPion (WELLBUTRIN XL) 300 MG 24 hr tablet Take 1 tablet (300 mg total) by mouth daily.  . Calcium Citrate 200 MG TABS Take by mouth.  . chlorpheniramine-HYDROcodone (TUSSIONEX) 10-8 MG/5ML SUER Take 5 mLs by mouth daily as needed for cough. Reported on 01/05/2016  . DEXILANT 60 MG capsule TAKE ONE CAPSULE BY MOUTH EVERY DAY  . diltiazem (CARDIZEM CD) 120 MG 24 hr capsule Take 1 capsule (120 mg total) by mouth daily. PLEASE CONTACT OFFICE FOR ADDITIONAL  REFILLS FINAL WARNING  . estradiol (ESTRACE) 2 MG tablet Take 2 mg by mouth at bedtime.   . fenofibrate (TRICOR) 145 MG tablet Take 1 tablet (145 mg total) by mouth daily. PLEASE CONTACT OFFICE FOR ADDITIONAL REFILLS FINAL WARNING  . fluticasone (FLONASE) 50 MCG/ACT nasal spray Place 2 sprays into both nostrils daily.  . furosemide (LASIX) 40 MG tablet TAKE 1 TABLET BY MOUTH TWICE A DAY  . gabapentin (NEURONTIN) 100 MG capsule TAKE ONE CAPSULE BY MOUTH 3 TIMES A DAY  . glucose blood (ONE TOUCH ULTRA TEST) test strip 1 each by Other route 3 (three) times daily. Use as instructed  . glyBURIDE (DIABETA) 2.5 MG tablet Take 2.5 mg by mouth.  . insulin aspart (NOVOLOG FLEXPEN) 100 UNIT/ML FlexPen Inject 12-20 Units into the skin 3 (three) times daily with meals. Per sliding scale  . Insulin Glargine (TOUJEO SOLOSTAR) 300 UNIT/ML SOPN Inject 45 Units into the skin  daily.  . Insulin Pen Needle 33G X 4 MM MISC 1 each by Does not apply route 4 (four) times daily.  [name] LORazepam (ATIVAN) 1 MG tablet Take 1 mg by mouth.  . losartan (COZAAR) 50 MG tablet TAKE 1 TABLET (50 MG TOTAL) BY MOUTH DAILY.  [name] losartan (COZAAR) 50 MG tablet TAKE 1 TABLET (50 MG TOTAL) BY MOUTH DAILY.  . metFORMIN (GLUCOPHAGE) 1000 MG tablet TAKE ONE TABLET BY MOUTH TWICE DAILY WITH MEALS  . mometasone-formoterol (DULERA) 200-5 MCG/ACT AERO Inhale 2 puffs into the lungs 2 (two) times daily.  [name] moxifloxacin (VIGAMOX) 0.5 % ophthalmic solution INSTILL 1 DROP IN RIGHT EYE 4 X DAILY STARTING 2 DAYS PRIOR TO SURGERY& 2 DROPS MORNING OF SURGERY  . nitroGLYCERIN (NITROSTAT) 0.4 MG SL tablet Place 0.4 mg under the tongue every 5 (five) minutes as needed. Reported on 01/05/2016  . omeprazole (PRILOSEC) 20 MG capsule Take 20 mg by mouth.  . ondansetron (ZOFRAN-ODT) 4 MG disintegrating tablet Take 4 mg by mouth every 8 (eight) hours as needed for nausea or vomiting. Reported on 01/05/2016  . PARoxetine Mesylate (BRISDELLE) 7.5 MG CAPS Take 7.5 mg by mouth daily.  Vladimir Faster Glycol-Propyl Glycol (SYSTANE) 0.4-0.3 % SOLN Apply 1 drop to eye at bedtime.  . potassium chloride SA (K-DUR,KLOR-CON) 20 MEQ tablet Take 1 tablet (20 mEq total) by mouth 3 (three) times daily.  . predniSONE (DELTASONE) 20 MG tablet TAKE 3 TABLETS BY MOUTH EVERY MORNING X 3 DAYS, THEN 2 TABLETS X 3 DAYS, THEN 1 TABLET X 3 DAYS  . PREDNISONE PO Take 5 mg by mouth daily. 20 mg through 10/15/16  . terbinafine (LAMISIL) 250 MG tablet Take 1 tablet (250 mg total) by mouth daily.  [name] triamcinolone cream (KENALOG) 0.1 % Apply topically.  . venlafaxine XR (EFFEXOR-XR) 75 MG 24 hr capsule Take 2 capsules (150 mg total) by mouth at bedtime.  . vitamin B-12 (CYANOCOBALAMIN) 1000 MCG tablet Take 1,000 mcg by mouth daily.    Allergies  Allergen Reactions  . Methotrexate Other (See Comments)     peri-oral and buccal lesions.  . Vancomycin  Other (See Comments)    nephrotoxicity  . Clindamycin/Lincomycin Nausea And Vomiting and Rash  . Chlorhexidine Itching  . Doxycycline Rash  . Lisinopril Cough  . Teflaro [Ceftaroline] Rash    Social History   Social History  . Marital status: Married    Spouse name: RUAH NOETH  . Number of children: 2  . Years of education: 12   Occupational History  . DISABLED  Social History Main Topics  . Smoking status: Never Smoker  . Smokeless tobacco: Never Used  . Alcohol use No  . Drug use: No  . Sexual activity: Yes    Birth control/ protection: Surgical   Other Topics Concern  . None   Social History Narrative   Married 1994. 2 sons who both live close and 1 grandson.       Disability due to sarcoidosis. 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  Wt Readings from Last 3 Encounters:  11/19/16 110.9 kg (244 lb 9.6 oz)  10/14/16 111.6 kg (246 lb)  09/29/16 110.7 kg (244 lb)    PHYSICAL EXAM BP 126/72   Pulse 70   Ht 5\' 6"  (1.676 m)   Wt 110.9 kg (244 lb 9.6 oz)   BMI 39.48 kg/m  General appearance: A&O x3, cooperative, appears stated age, no distress and morbidly obese, with very thick neck making jugular venous distention impossible to assess. Neck: no carotid bruit, supple, symmetrical, trachea midline, thyroid not enlarged, symmetric, no tenderness/mass/nodules and Unable to really assess JVD due to her body habitus was obese neck.  Lungs:CTA B. Mild tubular breath sound with overall diminished air  movement. Clear to percussion bilaterally and Nonlabored - but does have accessory muscle use @ baseline Heart: regular rate and rhythm, S1 & S2 normal, no murmur, click, rub or gallop and Unable to palpate apical impulse  Abdomen: soft, non-tender; bowel sounds normal; no masses; Significant truncal obesity, unable to palpate HSM or determine HJR  Extremities: edema 1-2+ bilaterally, varicose veins noted and No erythema or rash. Small healing stasis ulcers  Pulses: 2+ and symmetric    Adult ECG Report  Rate: 70 ;  Rhythm: normal sinus rhythm, premature ventricular contractions (PVC) and otherwise normal axis, intervals & durations.;   Narrative Interpretation: stable EKG   Other studies Reviewed: Additional studies/ records that were reviewed today include:  Recent Labs:  Checked by PCP  Lab Results  Component Value Date   CHOL 171 01/15/2016   HDL 33.30 (L) 01/15/2016   LDLCALC UNABLE TO CALCULATE IF TRIGLYCERIDE OVER 400 mg/dL 05/28/2011   LDLDIRECT 76.0 01/15/2016   TRIG (H) 01/15/2016    446.0 Triglyceride is over 400; calculations on Lipids are invalid.   CHOLHDL 5 01/15/2016     ASSESSMENT / PLAN: Problem List Items Addressed This Visit    Hypertensive heart disease with chronic diastolic congestive heart failure ([place]) - Primary (Chronic)    Relatively well-controlled blood pressure today. No active heart failure symptoms. She remains on diltiazem (with preserved EF) and losartan. Not on a beta blocker because of pulmonary disease. She is on moderate of Lasix twice a day with well-controlled swelling.      Relevant Orders   EKG 12-Lead   Hypertriglyceridemia (Chronic)    Still has elevated triglycerides on fenofibrate and statin. Discussed importance of dietary modification.      Essential hypertension (Chronic)    My previous note indicated that she had been on Cartia, losartan as  well as carvedilol, however carvedilol is not listed as current medication. Would  need to clarify.      Relevant Orders   EKG 12-Lead      Current medicines are reviewed at length with the patient today. (+/- concerns) n/a The following changes have been made: n/a  Patient Instructions  NO CHANGES WITH CURRENT MEDICATIONS    Your physician wants you to follow-up in [place].You will receive a reminder letter in the mail two months in advance. If you don't receive a letter, please call our office to schedule the follow-up appointment.    If you need a refill on your cardiac medications before your next appointment, please call your pharmacy.    Studies Ordered:   Orders Placed This Encounter  Procedures  . EKG 12-Lead      [name], M.D., M.S. Interventional Cardiologist   Pager # [phone] Phone # [phone] [address] [place], [place] [address]

## 2016-11-20 ENCOUNTER — Encounter: Payer: Self-pay | Admitting: Cardiology

## 2016-11-20 NOTE — Assessment & Plan Note (Signed)
My previous note indicated that she had been on Cartia, losartan as well as carvedilol, however carvedilol is not listed as current medication. Would need to clarify.

## 2016-11-20 NOTE — Assessment & Plan Note (Signed)
Still has elevated triglycerides on fenofibrate and statin. Discussed importance of dietary modification.

## 2016-11-20 NOTE — Assessment & Plan Note (Signed)
Relatively well-controlled blood pressure today. No active heart failure symptoms. She remains on diltiazem (with preserved EF) and losartan. Not on a beta blocker because of pulmonary disease. She is on moderate of Lasix twice a day with well-controlled swelling.

## 2016-11-22 ENCOUNTER — Encounter: Payer: Self-pay | Admitting: Family Medicine

## 2016-11-22 ENCOUNTER — Ambulatory Visit (INDEPENDENT_AMBULATORY_CARE_PROVIDER_SITE_OTHER): Payer: BLUE CROSS/BLUE SHIELD | Admitting: Family Medicine

## 2016-11-22 DIAGNOSIS — E1165 Type 2 diabetes mellitus with hyperglycemia: Secondary | ICD-10-CM | POA: Diagnosis not present

## 2016-11-22 DIAGNOSIS — E1140 Type 2 diabetes mellitus with diabetic neuropathy, unspecified: Secondary | ICD-10-CM | POA: Diagnosis not present

## 2016-11-22 DIAGNOSIS — Z794 Long term (current) use of insulin: Secondary | ICD-10-CM

## 2016-11-22 DIAGNOSIS — E249 Cushing's syndrome, unspecified: Secondary | ICD-10-CM

## 2016-11-22 DIAGNOSIS — E242 Drug-induced Cushing's syndrome: Secondary | ICD-10-CM

## 2016-11-22 DIAGNOSIS — I10 Essential (primary) hypertension: Secondary | ICD-10-CM

## 2016-11-22 DIAGNOSIS — IMO0002 Reserved for concepts with insufficient information to code with codable children: Secondary | ICD-10-CM

## 2016-11-22 MED ORDER — CARVEDILOL 25 MG PO TABS: 25.0000 mg | ORAL_TABLET | Freq: Two times a day (BID) | ORAL | 3 refills | Status: DC

## 2016-11-22 NOTE — Patient Instructions (Addendum)
Have your eye doctor send [place] a copy of latest diabetic eye exam.   Mammogram next Tuesday- have Dr. Ouida Sills send [place] a copy  No changes.

## 2016-11-22 NOTE — Assessment & Plan Note (Signed)
S: controlled on Cartia XT 120mg , carvedilol 25 mg BID, losartan 50mg . Coreg had fallen off med list. Listed 10/14/16 then not on 11/19/16 when saw Dr. Ellyn Hack- no reason for d/c noted in interim between visits- question at time of visit if she was on medicine. Patient thinks she is taking medication at home.  BP Readings from Last 3 Encounters:  11/22/16 124/62  11/19/16 126/72  10/14/16 117/71  A/P:Continue current meds:  Opted to refill coreg and will send Dr. Ellyn Hack a message to inform him of this refill.

## 2016-11-22 NOTE — Assessment & Plan Note (Addendum)
Managed by endocrine primarily- compliant with her toujeo and aspart sliding scale.  Her weight is down 10 lbs! Since last visit in this office- has been going to weight watchers.  Sugars down to 120-130 in AM which Is improvement from over 150 last time she saw Dr. Cruzita Lederer- she attributes this to toujeo dose increasing to 45 units and her diet improvement.  Need to get copy of most recent diabetic eye exam- she will request.

## 2016-11-22 NOTE — Assessment & Plan Note (Signed)
S: chronic prednisone 5mg . Supraclavicular fat pads prominent as is central adiposity A/P: noted- fortunately has done well recently on dose of prednisone form pulmonary perspective. Decisions on reduction would be made through pulmonology.

## 2016-11-22 NOTE — Progress Notes (Signed)
Subjective:  [name] is a 56 y.o. year old very pleasant female patient who presents for/with See problem oriented charting ROS- denies any recent chest pain. Breathing has been stable- no recent wheeze or increase in baseline SOB. No fever/chills.    Past Medical History-  Patient Active Problem List   Diagnosis Date Noted  . Obstructive chronic bronchitis without exacerbation ([place]) 09/18/2013    Priority: High  . Chest pain - at rest, and with exertion; -- angiographically normal coronary arteries, mildly elevated LVEDP 04/11/2013    Priority: High  . Hypertensive heart disease with chronic diastolic congestive heart failure ([place])     Priority: High  . Sarcoidosis of lung ([place]) 04/10/2007    Priority: High  . Type II diabetes mellitus with neurological manifestations, uncontrolled ([place]) 08/21/2006    Priority: High  . Fatty liver 09/30/2014    Priority: Medium  . Depression     Priority: Medium  . Gout 08/20/2010    Priority: Medium  . Anemia 09/18/2009    Priority: Medium  . Sleep apnea 04/21/2009    Priority: Medium  . Hypertriglyceridemia 08/21/2006    Priority: Medium  . Essential hypertension 08/21/2006    Priority: Medium  . Onychomycosis 10/27/2015    Priority: Low  . Hot flashes 07/15/2014    Priority: Low  . Abnormal SPEP 04/17/2014    Priority: Low  . Fracture of left leg 04/17/2014    Priority: Low  . Cushingoid side effect of steroids ([place]) 04/17/2014    Priority: Low  . Internal hemorrhoids     Priority: Low  . Preoperative clearance 03/25/2014    Priority: Low  . Solitary pulmonary nodule, on CT 02/2013 - stable over 2 years in 2015 02/20/2013    Priority: Low  . Obesity 06/04/2009    Priority: Low  . GERD 08/21/2006    Priority: Low  . Multiple allergies 10/14/2016  . Osteomyelitis of left foot ([place]) 05/29/2015  . MRSA (methicillin resistant staph aureus) culture positive 03/27/2015  . Wound infection complicating hardware ([place])  03/27/2015    Medications- reviewed and updated Current Outpatient Prescriptions  Medication Sig Dispense Refill  . albuterol (PROVENTIL HFA;VENTOLIN HFA) 108 (90 BASE) MCG/ACT inhaler Inhale 1-2 puffs into the lungs every 6 (six) hours as needed for wheezing or shortness of breath. 8 g 5  . albuterol (PROVENTIL) (2.5 MG/3ML) 0.083% nebulizer solution Take 3 mLs (2.5 mg total) by nebulization every 6 (six) hours as needed for wheezing or shortness of breath. 150 mL 6  . allopurinol (ZYLOPRIM) 100 MG tablet Take 100 mg by mouth at bedtime.     [name] aspirin EC 81 MG tablet Take 81 mg by mouth daily.     [name] atorvastatin (LIPITOR) 40 MG tablet TAKE 1 TAB BY MOUTH ONCE DAILY 90 tablet 3  . benzonatate (TESSALON) 100 MG capsule Take 100 mg by mouth 3 (three) times daily as needed for cough. Reported on 01/05/2016    . buPROPion (WELLBUTRIN XL) 300 MG 24 hr tablet Take 1 tablet (300 mg total) by mouth daily. 90 tablet 3  . chlorpheniramine-HYDROcodone (TUSSIONEX) 10-8 MG/5ML SUER Take 5 mLs by mouth daily as needed for cough. Reported on 01/05/2016  0  . DEXILANT 60 MG capsule TAKE ONE CAPSULE BY MOUTH EVERY DAY 30 capsule 5  . diltiazem (CARDIZEM CD) 120 MG 24 hr capsule Take 1 capsule (120 mg total) by mouth daily. PLEASE CONTACT OFFICE FOR ADDITIONAL REFILLS FINAL WARNING 7 capsule 0  .  estradiol (ESTRACE) 2 MG tablet Take 2 mg by mouth at bedtime.     . fenofibrate (TRICOR) 145 MG tablet Take 1 tablet (145 mg total) by mouth daily. PLEASE CONTACT OFFICE FOR ADDITIONAL REFILLS FINAL WARNING 7 tablet 0  . fluticasone (FLONASE) 50 MCG/ACT nasal spray Place 2 sprays into both nostrils daily. 48 g 0  . furosemide (LASIX) 40 MG tablet TAKE 1 TABLET BY MOUTH TWICE A DAY 60 tablet 3  . gabapentin (NEURONTIN) 100 MG capsule TAKE ONE CAPSULE BY MOUTH 3 TIMES A DAY 90 capsule 0  . glucose blood (ONE TOUCH ULTRA TEST) test strip 1 each by Other route 3 (three) times daily. Use as instructed 100 each 5  . glyBURIDE  (DIABETA) 2.5 MG tablet Take 2.5 mg by mouth.    . insulin aspart (NOVOLOG FLEXPEN) 100 UNIT/ML FlexPen Inject 12-20 Units into the skin 3 (three) times daily with meals. Per sliding scale 30 mL 5  . Insulin Glargine (TOUJEO SOLOSTAR) 300 UNIT/ML SOPN Inject 45 Units into the skin daily. 5 pen 5  . Insulin Pen Needle 33G X 4 MM MISC 1 each by Does not apply route 4 (four) times daily. 200 each 11  . LORazepam (ATIVAN) 1 MG tablet Take 5 mg by mouth.     . losartan (COZAAR) 50 MG tablet TAKE 1 TABLET (50 MG TOTAL) BY MOUTH DAILY.  3  . metFORMIN (GLUCOPHAGE) 1000 MG tablet TAKE ONE TABLET BY MOUTH TWICE DAILY WITH MEALS 180 tablet 0  . mometasone-formoterol (DULERA) 200-5 MCG/ACT AERO Inhale 2 puffs into the lungs 2 (two) times daily. 13 g 5  . moxifloxacin (VIGAMOX) 0.5 % ophthalmic solution INSTILL 1 DROP IN RIGHT EYE 4 X DAILY STARTING 2 DAYS PRIOR TO SURGERY& 2 DROPS MORNING OF SURGERY  0  . nitroGLYCERIN (NITROSTAT) 0.4 MG SL tablet Place 0.4 mg under the tongue every 5 (five) minutes as needed. Reported on 01/05/2016    . omeprazole (PRILOSEC) 20 MG capsule Take 20 mg by mouth.    . ondansetron (ZOFRAN-ODT) 4 MG disintegrating tablet Take 4 mg by mouth every 8 (eight) hours as needed for nausea or vomiting. Reported on 01/05/2016  0  . PARoxetine Mesylate (BRISDELLE) 7.5 MG CAPS Take 7.5 mg by mouth daily.    Vladimir Faster Glycol-Propyl Glycol (SYSTANE) 0.4-0.3 % SOLN Apply 1 drop to eye at bedtime.    . potassium chloride SA (K-DUR,KLOR-CON) 20 MEQ tablet Take 1 tablet (20 mEq total) by mouth 3 (three) times daily. 90 tablet 1  . PREDNISONE PO Take 5 mg by mouth daily. 20 mg through 10/15/16    . venlafaxine XR (EFFEXOR-XR) 75 MG 24 hr capsule Take 2 capsules (150 mg total) by mouth at bedtime.    . vitamin B-12 (CYANOCOBALAMIN) 1000 MCG tablet Take 1,000 mcg by mouth daily.    . carvedilol (COREG) 25 MG tablet Take 1 tablet (25 mg total) by mouth 2 (two) times daily with a meal. 180 tablet 3    No current facility-administered medications for this visit.     Objective: BP 124/62 (BP Location: Left Arm, Patient Position: Sitting, Cuff Size: Large)   Pulse 68   Temp 98.4 F (36.9 C) (Oral)   Ht 5\' 6"  (1.676 m)   Wt 245 lb (111.1 kg)   SpO2 94%   BMI 39.54 kg/m  Gen: NAD, resting comfortably CV: RRR no murmurs rubs or gallops Supraclavicular fat pad Lungs: CTAB no crackles, wheeze, rhonchi Abdomen: soft/nontender/nondistended/normal bowel sounds.  Prominent central adiposity in comparison to legs/arms.   Ext: 1+ edema Skin: warm, dry  Assessment/Plan:  Essential hypertension S: controlled on Cartia XT 120mg , carvedilol 25 mg BID, losartan 50mg . Coreg had fallen off med list. Listed 10/14/16 then not on 11/19/16 when saw Dr. Ellyn Hack- no reason for d/c noted in interim between visits- question at time of visit if she was on medicine. Patient thinks she is taking medication at home.  BP Readings from Last 3 Encounters:  11/22/16 124/62  11/19/16 126/72  10/14/16 117/71  A/P:Continue current meds:  Opted to refill coreg and will send Dr. Ellyn Hack a message to inform him of this refill.   Type II diabetes mellitus with neurological manifestations, uncontrolled ([place]) Managed by endocrine primarily- compliant with her toujeo and aspart sliding scale.  Her weight is down 10 lbs! Since last visit in this office- has been going to weight watchers.  Sugars down to 120-130 in AM which Is improvement from over 150 last time she saw Dr. Cruzita Lederer- she attributes this to toujeo dose increasing to 45 units and her diet improvement.  Need to get copy of most recent diabetic eye exam- she will request.    Cushingoid side effect of steroids S: chronic prednisone 5mg . Supraclavicular fat pads prominent as is central adiposity A/P: noted- fortunately has done well recently on dose of prednisone form pulmonary perspective. Decisions on reduction would be made through pulmonology.    Return  in about 4 months (around 03/22/2017) for follow up- or sooner if needed. Mammogram next Tuesday- have Dr. Ouida Sills send [place] a copy  Meds ordered this encounter  Medications  . carvedilol (COREG) 25 MG tablet    Sig: Take 1 tablet (25 mg total) by mouth 2 (two) times daily with a meal.    Dispense:  180 tablet    Refill:  3    Return precautions advised.  Garret Reddish, MD

## 2016-11-22 NOTE — Progress Notes (Signed)
Pre visit review using our clinic review tool, if applicable. No additional management support is needed unless otherwise documented below in the visit note. 

## 2016-11-23 DIAGNOSIS — D860 Sarcoidosis of lung: Secondary | ICD-10-CM | POA: Diagnosis not present

## 2016-11-23 DIAGNOSIS — Z6839 Body mass index (BMI) 39.0-39.9, adult: Secondary | ICD-10-CM | POA: Diagnosis not present

## 2016-11-30 ENCOUNTER — Other Ambulatory Visit: Payer: Self-pay | Admitting: Obstetrics and Gynecology

## 2016-11-30 DIAGNOSIS — Z1231 Encounter for screening mammogram for malignant neoplasm of breast: Secondary | ICD-10-CM | POA: Diagnosis not present

## 2016-11-30 DIAGNOSIS — Z01419 Encounter for gynecological examination (general) (routine) without abnormal findings: Secondary | ICD-10-CM | POA: Diagnosis not present

## 2016-11-30 DIAGNOSIS — Z124 Encounter for screening for malignant neoplasm of cervix: Secondary | ICD-10-CM | POA: Diagnosis not present

## 2016-12-01 LAB — CYTOLOGY - PAP

## 2016-12-04 ENCOUNTER — Other Ambulatory Visit: Payer: Self-pay | Admitting: Cardiology

## 2016-12-04 DIAGNOSIS — J0100 Acute maxillary sinusitis, unspecified: Secondary | ICD-10-CM | POA: Diagnosis not present

## 2016-12-05 IMAGING — CR DG FOOT COMPLETE 3+V*L*
3 series · 3 of 3 positions shown · non-contrast
Comparison: None.

CLINICAL DATA: Left heel pain without known surgery.

EXAM:
LEFT FOOT - COMPLETE 3+ VIEW

[view not recorded (1 of 3)]
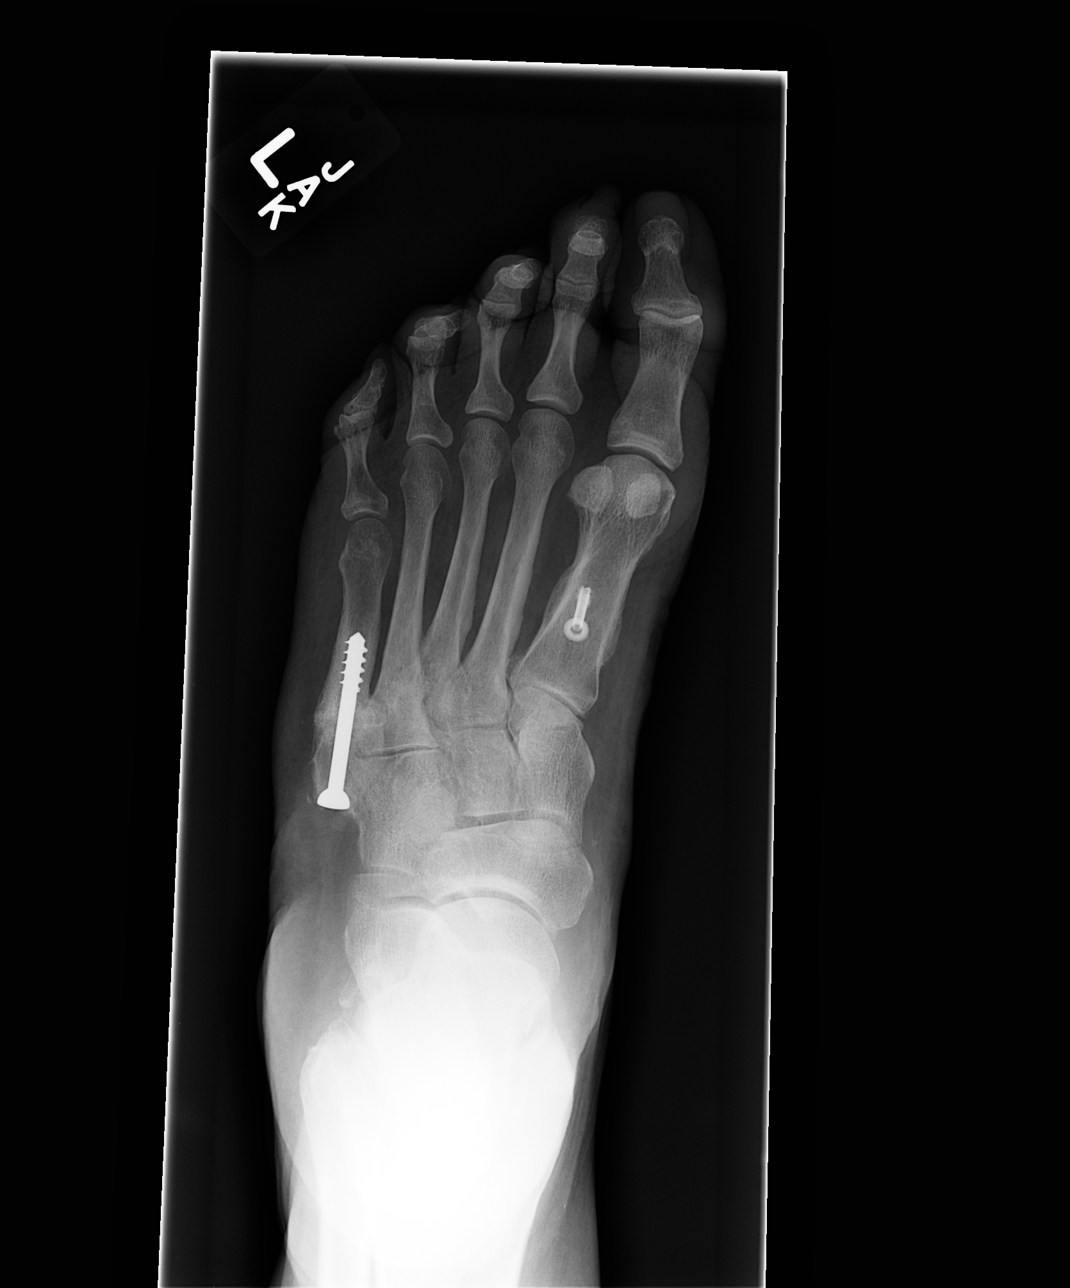

[view not recorded (2 of 3)]
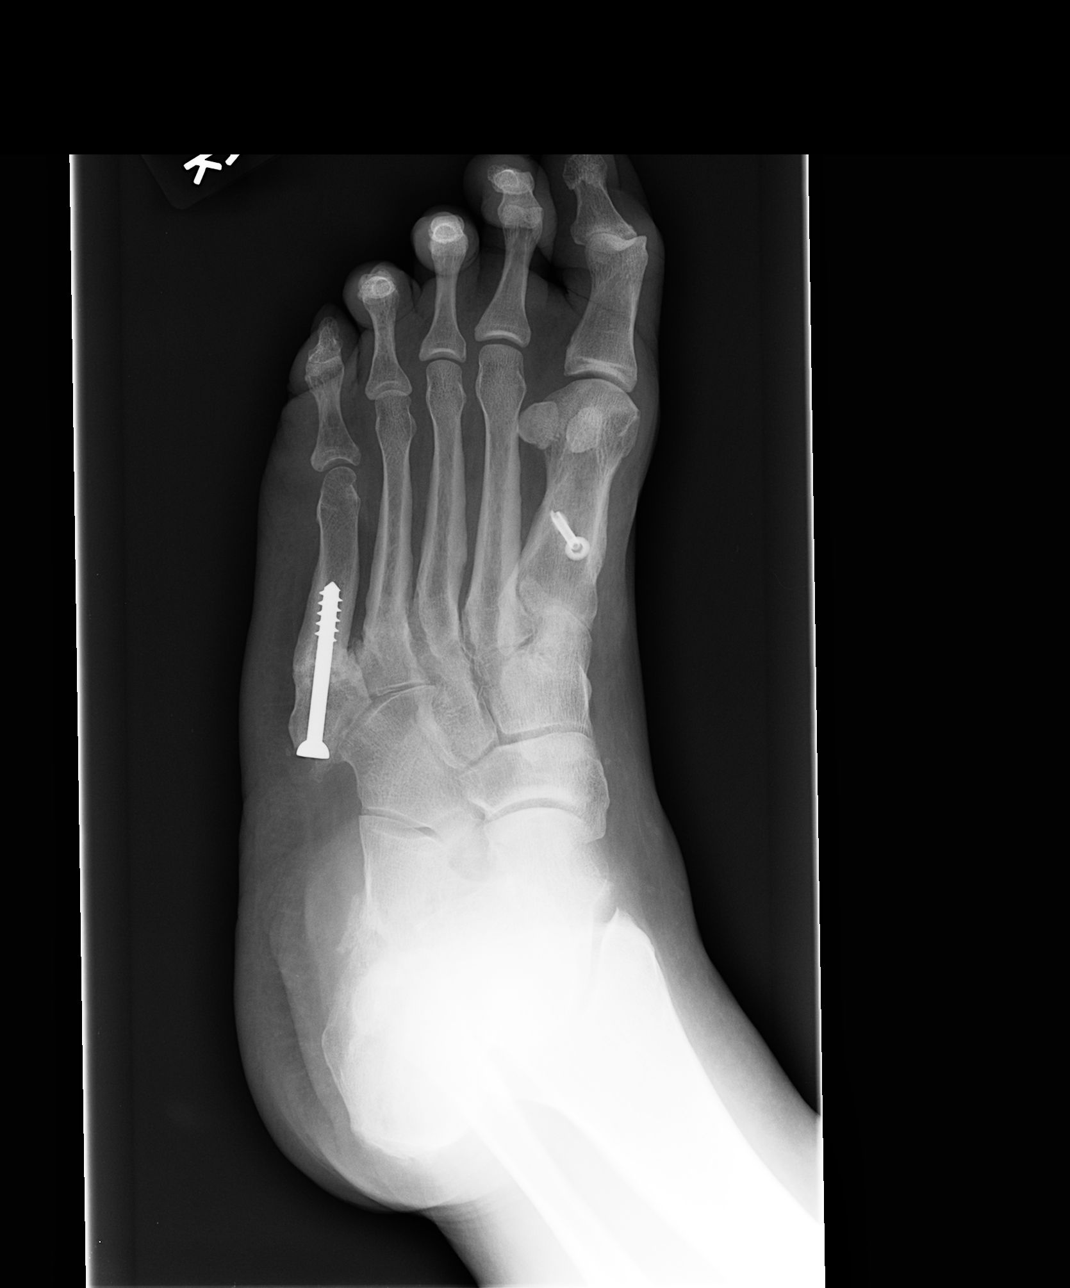

[view not recorded (3 of 3)]
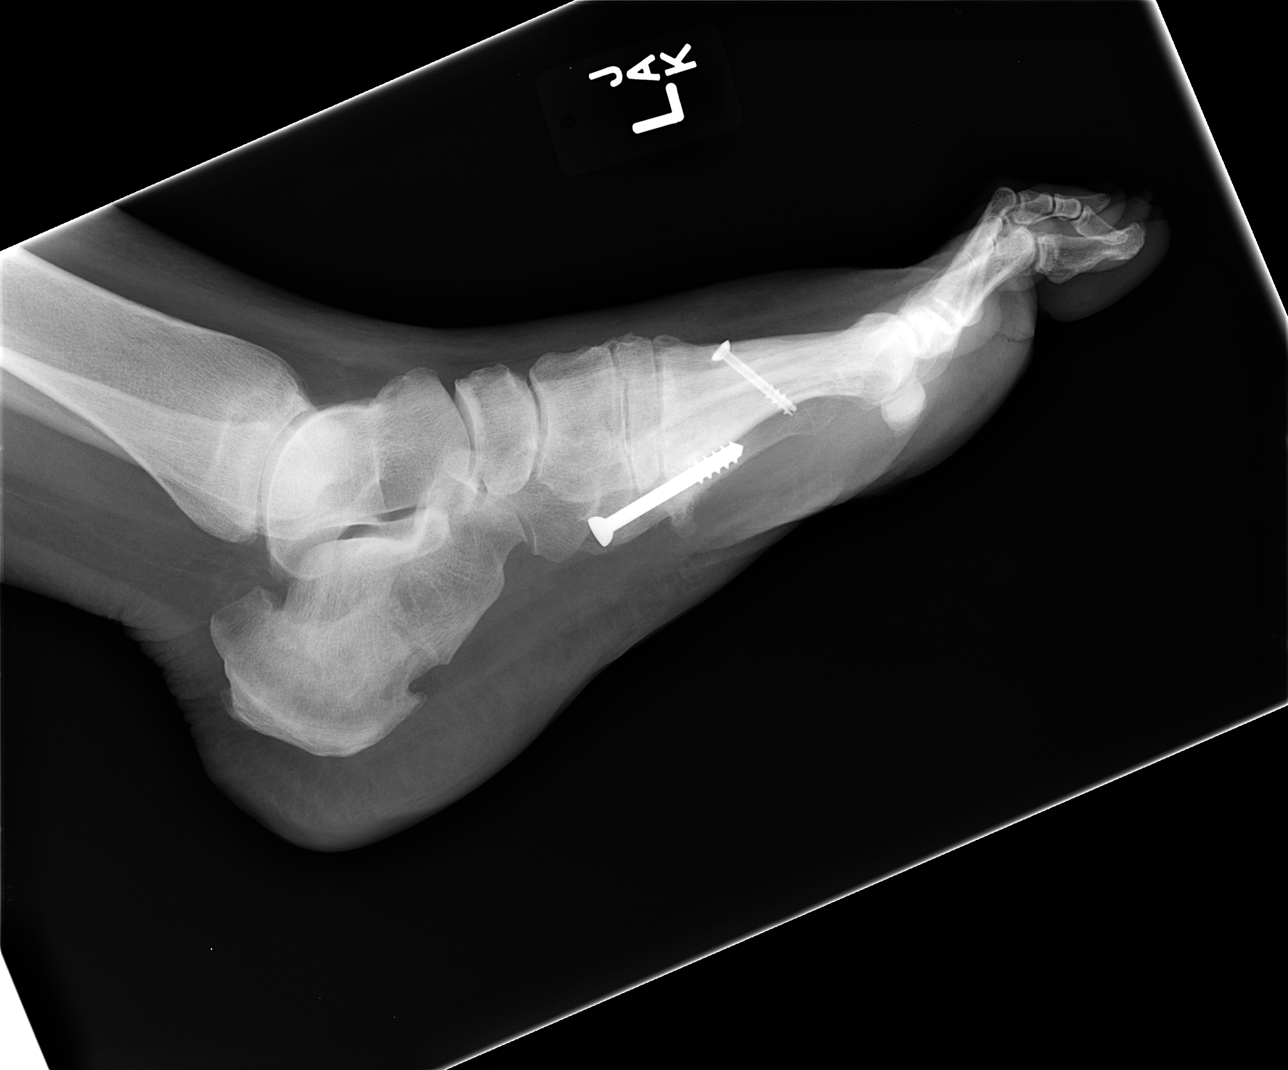

[3 of 3 positions shown; findings below may reference images not displayed]

FINDINGS: Status post postsurgical changes involving the first metatarsal and
fifth tarsometatarsal joint. Spurring of posterior calcaneus is
noted. No acute fracture or dislocation is noted. Deformity of
calcaneus is noted suggesting old fracture or postsurgical change.
No soft tissue abnormality is noted.
IMPRESSION: Postsurgical changes as described above. No acute abnormality seen
in the left foot.

## 2016-12-06 IMAGING — US US RENAL
1 series · 14 of 25 positions shown · non-contrast
Comparison: CT abdomen 08/23/2014

CLINICAL DATA: Acute renal failure, history of diabetes and
hypertension

EXAM:
RENAL / URINARY TRACT ULTRASOUND COMPLETE

[Series 1: us renal · 0.30mm/px · 14 of 28 slices shown]
[im 1/28]
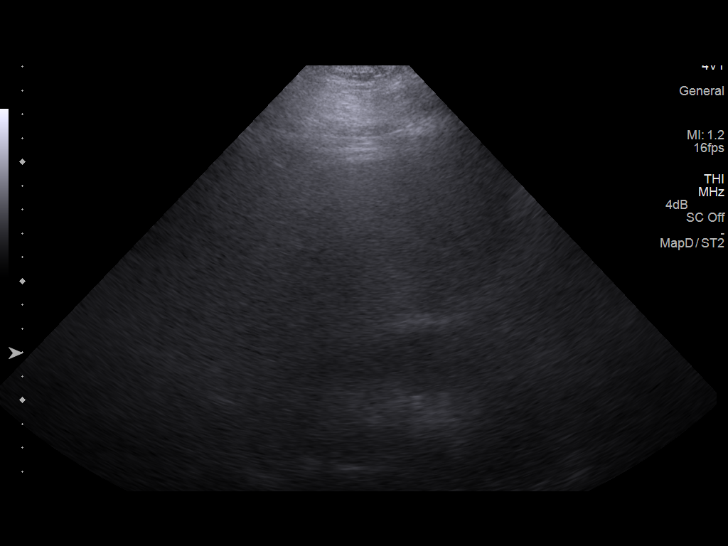
[im 3/28]
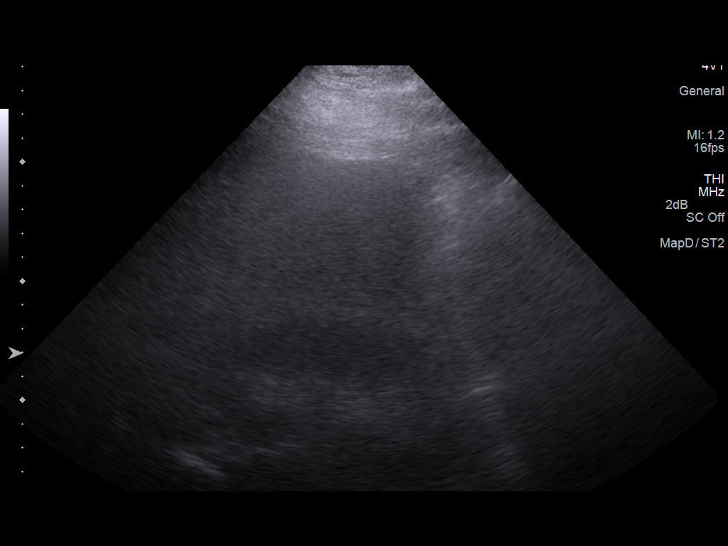
[im 5/28]
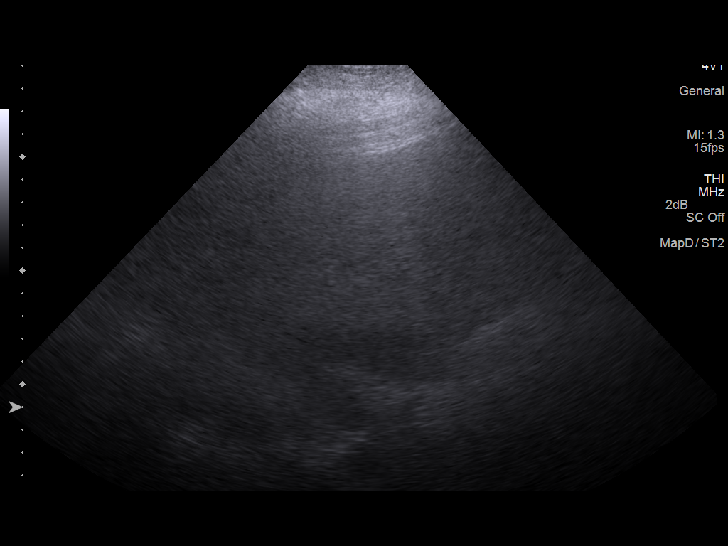
[im 7/28]
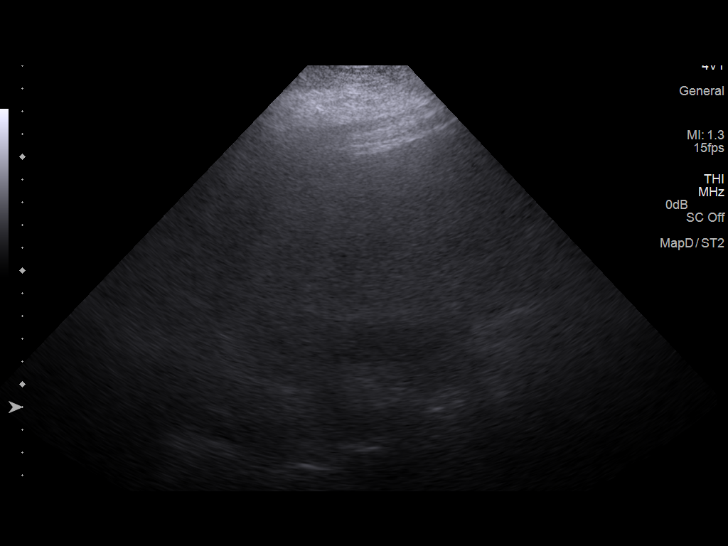
[im 10/28]
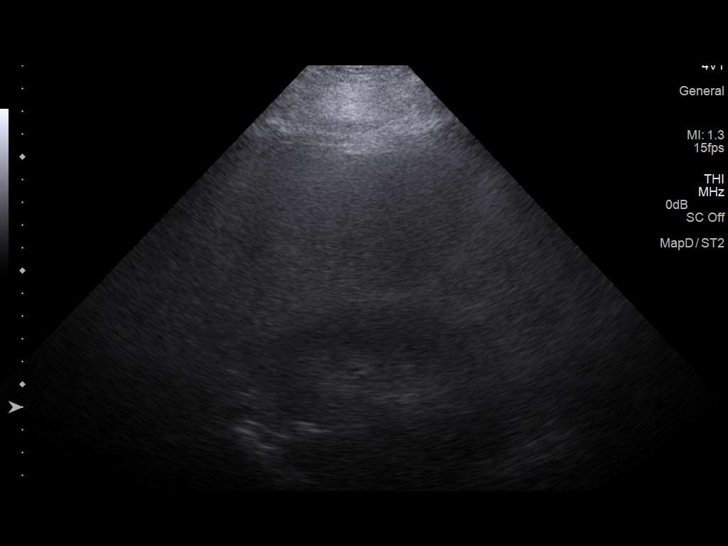
[im 11/28]
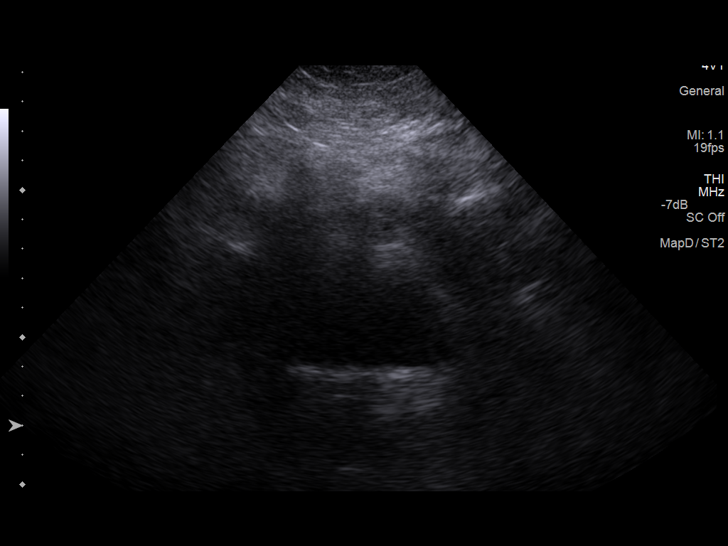
[im 13/28]
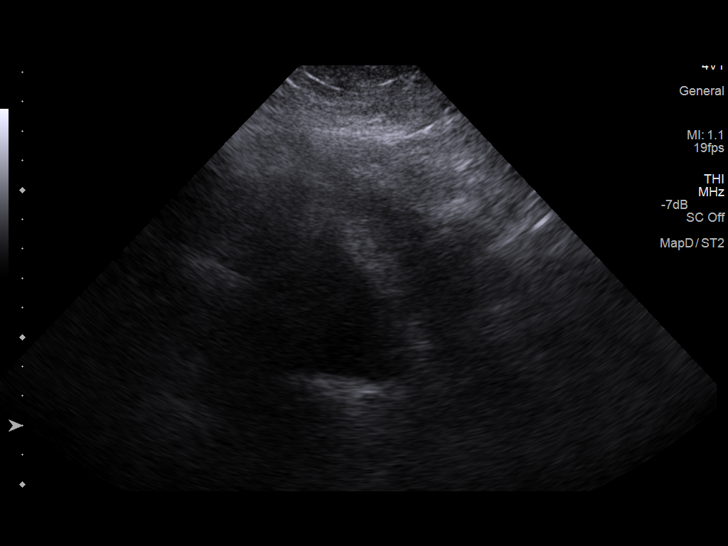
[im 15/28]
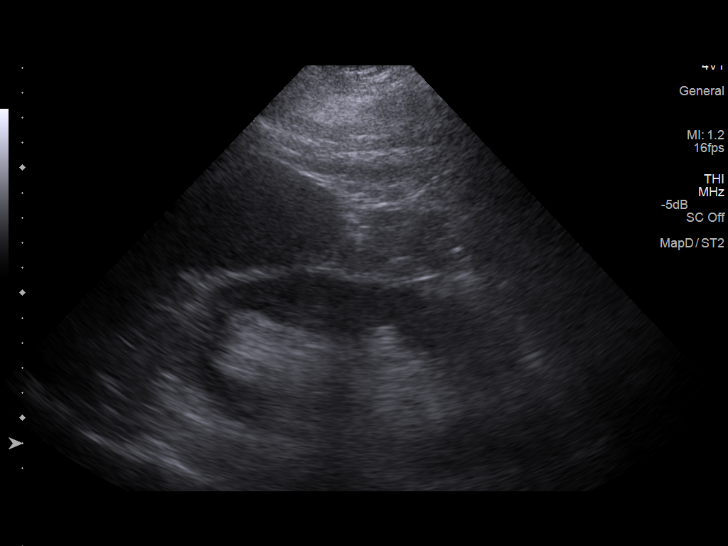
[im 17/28]
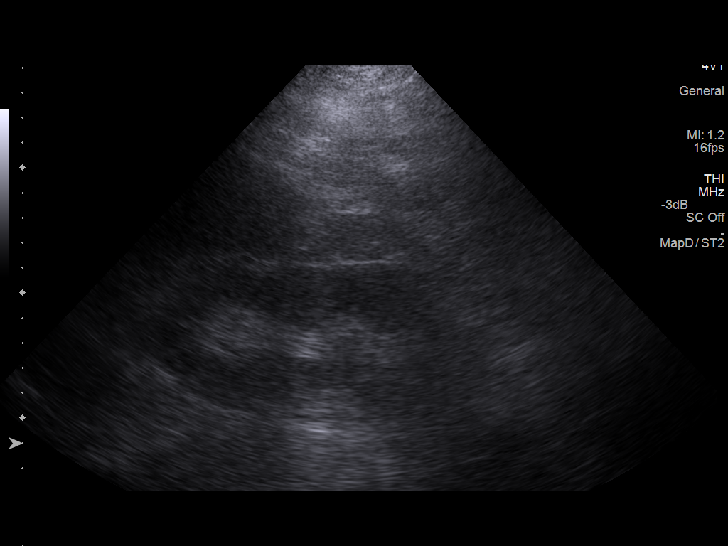
[im 19/28]
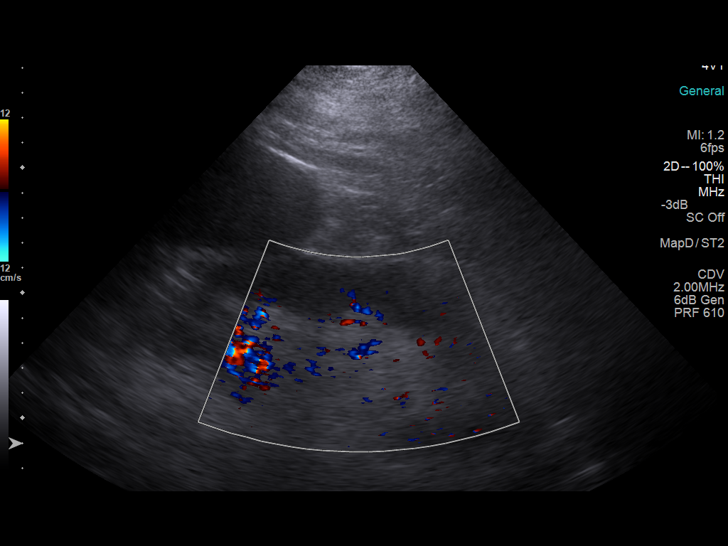
[im 21/28]
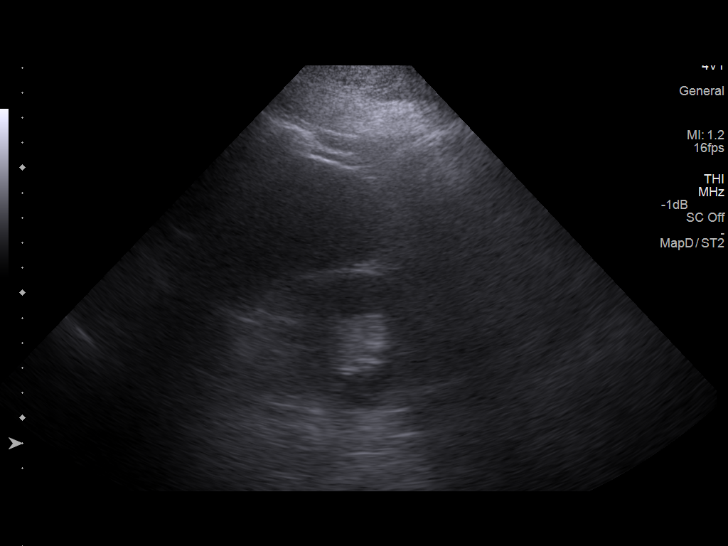
[im 23/28]
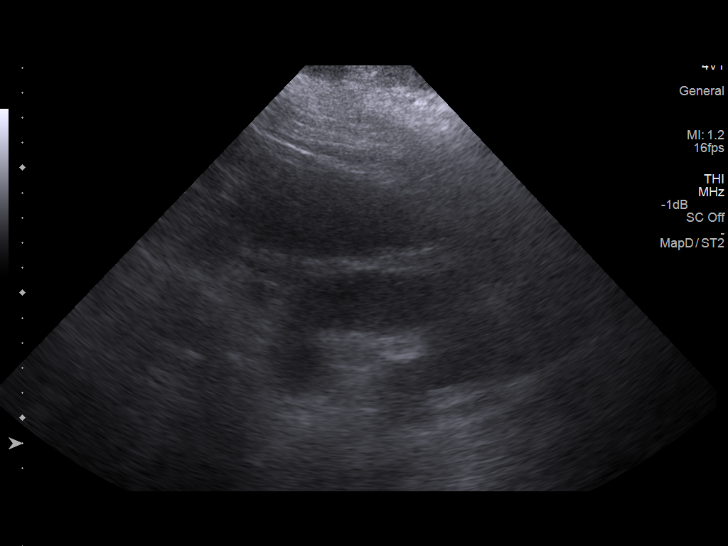
[im 25/28]
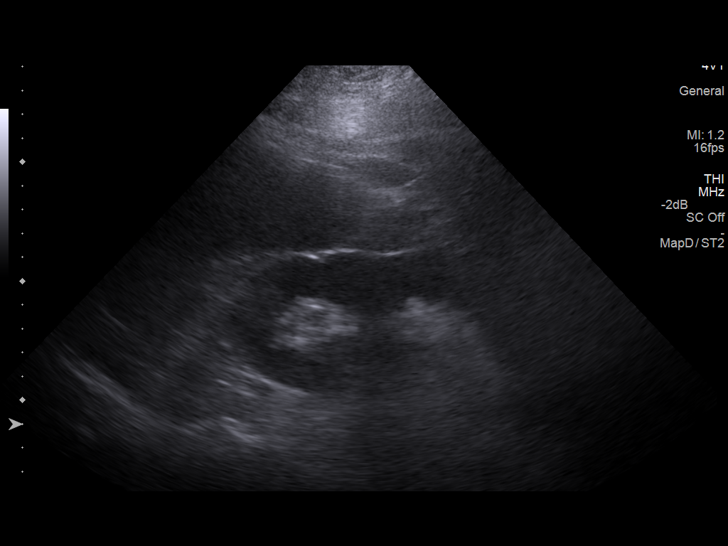
[im 28/28]
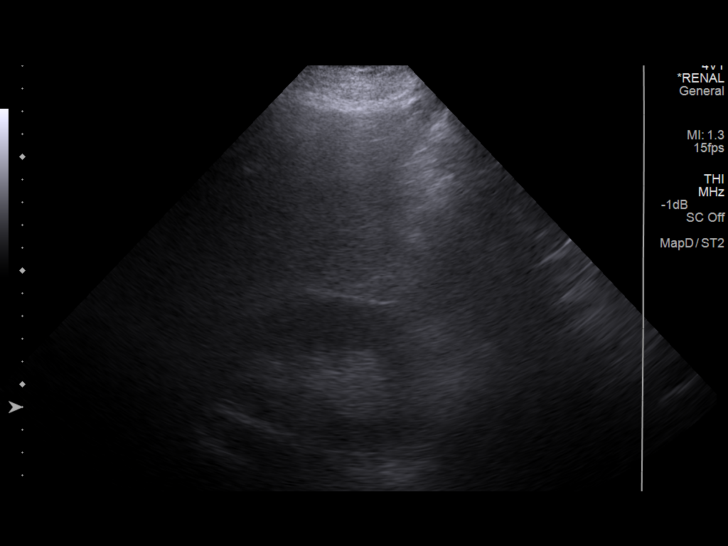

[14 of 25 positions shown; findings below may reference images not displayed]

FINDINGS: Overall exam is limited by body habitus.

Right Kidney:

Length: 12.5 cm. Echogenicity within normal limits. No mass or
hydronephrosis visualized.

Left Kidney:

Length: 13.2 cm. Echogenicity within normal limits. No mass or
hydronephrosis visualized.

Bladder:

Appears normal for degree of bladder distention.
IMPRESSION: Normal renal ultrasound.

## 2016-12-07 ENCOUNTER — Ambulatory Visit: Payer: BLUE CROSS/BLUE SHIELD | Admitting: Infectious Disease

## 2016-12-10 ENCOUNTER — Other Ambulatory Visit: Payer: Self-pay | Admitting: Cardiology

## 2016-12-10 ENCOUNTER — Other Ambulatory Visit: Payer: Self-pay | Admitting: Family Medicine

## 2016-12-10 NOTE — Telephone Encounter (Signed)
Rx(s) sent to pharmacy electronically.  

## 2016-12-14 ENCOUNTER — Ambulatory Visit (INDEPENDENT_AMBULATORY_CARE_PROVIDER_SITE_OTHER): Payer: BLUE CROSS/BLUE SHIELD | Admitting: Family Medicine

## 2016-12-14 ENCOUNTER — Encounter: Payer: Self-pay | Admitting: Family Medicine

## 2016-12-14 VITALS — BP 120/72 | HR 90 | Temp 98.2°F | Ht 66.0 in | Wt 242.2 lb

## 2016-12-14 DIAGNOSIS — H66001 Acute suppurative otitis media without spontaneous rupture of ear drum, right ear: Secondary | ICD-10-CM | POA: Diagnosis not present

## 2016-12-14 DIAGNOSIS — D869 Sarcoidosis, unspecified: Secondary | ICD-10-CM

## 2016-12-14 DIAGNOSIS — B9689 Other specified bacterial agents as the cause of diseases classified elsewhere: Secondary | ICD-10-CM

## 2016-12-14 DIAGNOSIS — J329 Chronic sinusitis, unspecified: Secondary | ICD-10-CM | POA: Diagnosis not present

## 2016-12-14 MED ORDER — AMOXICILLIN-POT CLAVULANATE 875-125 MG PO TABS: 1.0000 | ORAL_TABLET | Freq: Two times a day (BID) | ORAL | 0 refills | Status: DC

## 2016-12-14 NOTE — Progress Notes (Signed)
PCP: Garret Reddish, MD  Subjective:  [name] is a 56 y.o. year old very pleasant female patient who presents with sinusitis symptoms including nasal congestion, sinus tenderness, right ear pain -other symptoms include: some fatigue. Some cough.  -day of illness:12 days -Symptoms are worsening -previous treatments: she was seen in urgent care on the 25th and treated with azithromycin for sinus and ear infection. Improved for a few days while on antibiotic but never fully resolved- when she stopped within a few days worsened again -sick contacts/travel/risks: denies flu exposure.   ROS-denies fever, SOB, NVD.   Pertinent Past Medical History-  Patient Active Problem List   Diagnosis Date Noted  . Obstructive chronic bronchitis without exacerbation ([place]) 09/18/2013    Priority: High  . Chest pain - at rest, and with exertion; -- angiographically normal coronary arteries, mildly elevated LVEDP 04/11/2013    Priority: High  . Hypertensive heart disease with chronic diastolic congestive heart failure ([place])     Priority: High  . Sarcoidosis of lung ([place]) 04/10/2007    Priority: High  . Type II diabetes mellitus with neurological manifestations, uncontrolled ([place]) 08/21/2006    Priority: High  . Fatty liver 09/30/2014    Priority: Medium  . Depression     Priority: Medium  . Gout 08/20/2010    Priority: Medium  . Anemia 09/18/2009    Priority: Medium  . Sleep apnea 04/21/2009    Priority: Medium  . Hypertriglyceridemia 08/21/2006    Priority: Medium  . Essential hypertension 08/21/2006    Priority: Medium  . Onychomycosis 10/27/2015    Priority: Low  . Hot flashes 07/15/2014    Priority: Low  . Abnormal SPEP 04/17/2014    Priority: Low  . Fracture of left leg 04/17/2014    Priority: Low  . Cushingoid side effect of steroids ([place]) 04/17/2014    Priority: Low  . Internal hemorrhoids     Priority: Low  . Preoperative clearance 03/25/2014    Priority: Low  .  Solitary pulmonary nodule, on CT 02/2013 - stable over 2 years in 2015 02/20/2013    Priority: Low  . Obesity 06/04/2009    Priority: Low  . GERD 08/21/2006    Priority: Low  . Multiple allergies 10/14/2016  . Osteomyelitis of left foot ([place]) 05/29/2015  . MRSA (methicillin resistant staph aureus) culture positive 03/27/2015  . Wound infection complicating hardware ([place]) 03/27/2015    Medications- reviewed  Current Outpatient Prescriptions  Medication Sig Dispense Refill  . albuterol (PROVENTIL HFA;VENTOLIN HFA) 108 (90 BASE) MCG/ACT inhaler Inhale 1-2 puffs into the lungs every 6 (six) hours as needed for wheezing or shortness of breath. 8 g 5  . albuterol (PROVENTIL) (2.5 MG/3ML) 0.083% nebulizer solution Take 3 mLs (2.5 mg total) by nebulization every 6 (six) hours as needed for wheezing or shortness of breath. 150 mL 6  . allopurinol (ZYLOPRIM) 100 MG tablet Take 100 mg by mouth at bedtime.     [name] aspirin EC 81 MG tablet Take 81 mg by mouth daily.     [name] atorvastatin (LIPITOR) 40 MG tablet TAKE 1 TAB BY MOUTH ONCE DAILY 90 tablet 3  . benzonatate (TESSALON) 100 MG capsule Take 100 mg by mouth 3 (three) times daily as needed for cough. Reported on 01/05/2016    . buPROPion (WELLBUTRIN XL) 300 MG 24 hr tablet TAKE 1 TABLET (300 MG TOTAL) BY MOUTH DAILY. 90 tablet 3  . carvedilol (COREG) 25 MG tablet Take 1 tablet (25 mg  total) by mouth 2 (two) times daily with a meal. 180 tablet 3  . chlorpheniramine-HYDROcodone (TUSSIONEX) 10-8 MG/5ML SUER Take 5 mLs by mouth daily as needed for cough. Reported on 01/05/2016  0  . DEXILANT 60 MG capsule TAKE ONE CAPSULE BY MOUTH EVERY DAY 30 capsule 5  . diltiazem (CARDIZEM CD) 120 MG 24 hr capsule Take 1 capsule (120 mg total) by mouth daily. PLEASE CONTACT OFFICE FOR ADDITIONAL REFILLS FINAL WARNING 7 capsule 0  . estradiol (ESTRACE) 2 MG tablet Take 2 mg by mouth at bedtime.     . fenofibrate (TRICOR) 145 MG tablet Take 1 tablet (145 mg total) by mouth  daily. PLEASE CONTACT OFFICE FOR ADDITIONAL REFILLS FINAL WARNING 7 tablet 0  . fluticasone (FLONASE) 50 MCG/ACT nasal spray Place 2 sprays into both nostrils daily. 48 g 0  . furosemide (LASIX) 40 MG tablet TAKE 1 TABLET BY MOUTH TWICE A DAY 60 tablet 11  . gabapentin (NEURONTIN) 100 MG capsule TAKE ONE CAPSULE BY MOUTH 3 TIMES A DAY 90 capsule 0  . glucose blood (ONE TOUCH ULTRA TEST) test strip 1 each by Other route 3 (three) times daily. Use as instructed 100 each 5  . glyBURIDE (DIABETA) 2.5 MG tablet Take 2.5 mg by mouth.    . insulin aspart (NOVOLOG FLEXPEN) 100 UNIT/ML FlexPen Inject 12-20 Units into the skin 3 (three) times daily with meals. Per sliding scale 30 mL 5  . Insulin Glargine (TOUJEO SOLOSTAR) 300 UNIT/ML SOPN Inject 45 Units into the skin daily. 5 pen 5  . Insulin Pen Needle 33G X 4 MM MISC 1 each by Does not apply route 4 (four) times daily. 200 each 11  . LORazepam (ATIVAN) 1 MG tablet Take 5 mg by mouth.     . losartan (COZAAR) 50 MG tablet TAKE 1 TABLET (50 MG TOTAL) BY MOUTH DAILY.  3  . metFORMIN (GLUCOPHAGE) 1000 MG tablet TAKE ONE TABLET BY MOUTH TWICE DAILY WITH MEALS 180 tablet 0  . mometasone-formoterol (DULERA) 200-5 MCG/ACT AERO Inhale 2 puffs into the lungs 2 (two) times daily. 13 g 5  . moxifloxacin (VIGAMOX) 0.5 % ophthalmic solution INSTILL 1 DROP IN RIGHT EYE 4 X DAILY STARTING 2 DAYS PRIOR TO SURGERY& 2 DROPS MORNING OF SURGERY  0  . nitroGLYCERIN (NITROSTAT) 0.4 MG SL tablet Place 0.4 mg under the tongue every 5 (five) minutes as needed. Reported on 01/05/2016    . omeprazole (PRILOSEC) 20 MG capsule Take 20 mg by mouth.    . ondansetron (ZOFRAN-ODT) 4 MG disintegrating tablet Take 4 mg by mouth every 8 (eight) hours as needed for nausea or vomiting. Reported on 01/05/2016  0  . PARoxetine Mesylate (BRISDELLE) 7.5 MG CAPS Take 7.5 mg by mouth daily.    [place] Faster Glycol-Propyl Glycol (SYSTANE) 0.4-0.3 % SOLN Apply 1 drop to eye at bedtime.    . potassium  chloride SA (K-DUR,KLOR-CON) 20 MEQ tablet Take 1 tablet (20 mEq total) by mouth 3 (three) times daily. 90 tablet 1  . PREDNISONE PO Take 5 mg by mouth daily. 20 mg through 10/15/16    . venlafaxine XR (EFFEXOR-XR) 75 MG 24 hr capsule Take 2 capsules (150 mg total) by mouth at bedtime.    [name] venlafaxine XR (EFFEXOR-XR) 75 MG 24 hr capsule TAKE 1 CAPSULE (75 MG TOTAL) BY MOUTH 2 (TWO) TIMES DAILY. 180 capsule 3  . vitamin B-12 (CYANOCOBALAMIN) 1000 MCG tablet Take 1,000 mcg by mouth daily.     No  current facility-administered medications for this visit.     Objective: BP 120/72 (BP Location: Left Arm, Patient Position: Sitting, Cuff Size: Large)   Pulse 90   Temp 98.2 F (36.8 C) (Oral)   Ht 5\' 6"  (1.676 m)   Wt 242 lb 3.2 oz (109.9 kg)   SpO2 98%   BMI 39.09 kg/m  Gen: NAD, resting comfortably HEENT: Turbinates erythematous with yellow drainage, TM normal on left- on right side there is erythema as compared to right ear, pharynx mildly erythematous with no tonsilar exudate or edema, bilateral maxillary sinus tenderness CV: RRR no murmurs rubs or gallops Lungs: CTAB no crackles, wheeze, rhonchi Abdomen: soft/nontender/nondistended/normal bowel sounds. No rebound or guarding.  Ext: no edema Skin: warm, dry, no rash Neuro: grossly normal, moves all extremities  Assessment/Plan:  Sinsusitis Bacterial based on: Symptoms >10 days, double sickening, or severe symptoms in first 3 days. Also the right ear is red as compared to left and possible otitis media. Has failed azithromycin so will take next step of augmentin. If does not improve within the course or worsens after- should follow up. Mucinex may help with congestion issues.   Rash on cephalosporin in past but states she has tolerated augmentin in the past  High risk patient with history of sarcoidosis and history of multiple bacterial infections in the past. Fortunately does not appear to have infected lungs at present  Finally, we  reviewed reasons to return to care including if symptoms worsen or persist or new concerns arise (particularly fever or shortness of breath)  Meds ordered this encounter  Medications  . amoxicillin-clavulanate (AUGMENTIN) 875-125 MG tablet    Sig: Take 1 tablet by mouth 2 (two) times daily.    Dispense:  14 tablet    Refill:  0   Garret Reddish, MD

## 2016-12-14 NOTE — Progress Notes (Signed)
Pre visit review using our clinic review tool, if applicable. No additional management support is needed unless otherwise documented below in the visit note. 

## 2016-12-14 NOTE — Patient Instructions (Signed)
Sinsusitis Bacterial based on: Symptoms >10 days, double sickening, or severe symptoms in first 3 days. Also the right ear is red as compared to left and possible otitis media. Has failed azithromycin so will take next step of augmentin. If does not improve within the course or worsens after- should follow up. Mucinex may help with congestion issues.   Rash on cephalosporin in past but states she has tolerated augmentin in the past  Finally, we reviewed reasons to return to care including if symptoms worsen or persist or new concerns arise (particularly fever or shortness of breath)  Meds ordered this encounter  Medications  . amoxicillin-clavulanate (AUGMENTIN) 875-125 MG tablet    Sig: Take 1 tablet by mouth 2 (two) times daily.    Dispense:  14 tablet    Refill:  0

## 2016-12-20 ENCOUNTER — Other Ambulatory Visit: Payer: Self-pay | Admitting: Internal Medicine

## 2016-12-25 ENCOUNTER — Other Ambulatory Visit: Payer: Self-pay | Admitting: Cardiology

## 2016-12-26 DIAGNOSIS — M1711 Unilateral primary osteoarthritis, right knee: Secondary | ICD-10-CM | POA: Diagnosis not present

## 2016-12-28 ENCOUNTER — Ambulatory Visit: Payer: BLUE CROSS/BLUE SHIELD | Admitting: Internal Medicine

## 2016-12-28 NOTE — Telephone Encounter (Signed)
Rx(s) sent to pharmacy electronically.  

## 2016-12-30 ENCOUNTER — Other Ambulatory Visit: Payer: Self-pay | Admitting: Cardiology

## 2017-01-06 DIAGNOSIS — S93492D Sprain of other ligament of left ankle, subsequent encounter: Secondary | ICD-10-CM | POA: Diagnosis not present

## 2017-01-09 ENCOUNTER — Other Ambulatory Visit: Payer: Self-pay | Admitting: Family Medicine

## 2017-01-12 DIAGNOSIS — M25561 Pain in right knee: Secondary | ICD-10-CM | POA: Diagnosis not present

## 2017-01-12 DIAGNOSIS — M1711 Unilateral primary osteoarthritis, right knee: Secondary | ICD-10-CM | POA: Diagnosis not present

## 2017-01-19 DIAGNOSIS — M1711 Unilateral primary osteoarthritis, right knee: Secondary | ICD-10-CM | POA: Diagnosis not present

## 2017-01-20 DIAGNOSIS — Z961 Presence of intraocular lens: Secondary | ICD-10-CM | POA: Diagnosis not present

## 2017-01-26 ENCOUNTER — Encounter: Payer: Self-pay | Admitting: Infectious Disease

## 2017-01-26 ENCOUNTER — Ambulatory Visit
Admission: RE | Admit: 2017-01-26 | Discharge: 2017-01-26 | Disposition: A | Discharge status: Home / Self Care | Payer: BLUE CROSS/BLUE SHIELD | Source: Ambulatory Visit | Attending: Infectious Disease | Admitting: Infectious Disease

## 2017-01-26 ENCOUNTER — Ambulatory Visit (INDEPENDENT_AMBULATORY_CARE_PROVIDER_SITE_OTHER): Payer: BLUE CROSS/BLUE SHIELD | Admitting: Infectious Disease

## 2017-01-26 VITALS — BP 134/78 | HR 86 | Temp 98.6°F | Wt 245.0 lb

## 2017-01-26 DIAGNOSIS — Z889 Allergy status to unspecified drugs, medicaments and biological substances status: Secondary | ICD-10-CM | POA: Diagnosis not present

## 2017-01-26 DIAGNOSIS — IMO0002 Reserved for concepts with insufficient information to code with codable children: Secondary | ICD-10-CM

## 2017-01-26 DIAGNOSIS — T847XXD Infection and inflammatory reaction due to other internal orthopedic prosthetic devices, implants and grafts, subsequent encounter: Secondary | ICD-10-CM

## 2017-01-26 DIAGNOSIS — M86672 Other chronic osteomyelitis, left ankle and foot: Secondary | ICD-10-CM

## 2017-01-26 DIAGNOSIS — M25572 Pain in left ankle and joints of left foot: Secondary | ICD-10-CM | POA: Diagnosis not present

## 2017-01-26 DIAGNOSIS — D860 Sarcoidosis of lung: Secondary | ICD-10-CM

## 2017-01-26 DIAGNOSIS — M1A4720 Other secondary chronic gout, left ankle and foot, without tophus (tophi): Secondary | ICD-10-CM

## 2017-01-26 DIAGNOSIS — B351 Tinea unguium: Secondary | ICD-10-CM

## 2017-01-26 DIAGNOSIS — E1165 Type 2 diabetes mellitus with hyperglycemia: Secondary | ICD-10-CM | POA: Diagnosis not present

## 2017-01-26 DIAGNOSIS — Z794 Long term (current) use of insulin: Secondary | ICD-10-CM

## 2017-01-26 DIAGNOSIS — M25561 Pain in right knee: Secondary | ICD-10-CM

## 2017-01-26 DIAGNOSIS — M1711 Unilateral primary osteoarthritis, right knee: Secondary | ICD-10-CM | POA: Insufficient documentation

## 2017-01-26 DIAGNOSIS — M7989 Other specified soft tissue disorders: Secondary | ICD-10-CM | POA: Diagnosis not present

## 2017-01-26 DIAGNOSIS — E1140 Type 2 diabetes mellitus with diabetic neuropathy, unspecified: Secondary | ICD-10-CM

## 2017-01-26 HISTORY — DX: Pain in left ankle and joints of left foot: M25.572

## 2017-01-26 HISTORY — DX: Pain in right knee: M25.561

## 2017-01-26 LAB — CBC WITH DIFFERENTIAL/PLATELET
Basophils Absolute: 0 cells/uL (ref 0–200)
Basophils Relative: 0 %
Eosinophils Absolute: 161 cells/uL (ref 15–500)
Eosinophils Relative: 1 %
HCT: 34.2 % — ABNORMAL LOW (ref 35.0–45.0)
Hemoglobin: 10.8 g/dL — ABNORMAL LOW (ref 11.7–15.5)
Lymphocytes Relative: 28 %
Lymphs Abs: 4508 cells/uL — ABNORMAL HIGH (ref 850–3900)
MCH: 27.4 pg (ref 27.0–33.0)
MCHC: 31.6 g/dL — ABNORMAL LOW (ref 32.0–36.0)
MCV: 86.8 fL (ref 80.0–100.0)
MPV: 9.1 fL (ref 7.5–12.5)
Monocytes Absolute: 966 cells/uL — ABNORMAL HIGH (ref 200–950)
Monocytes Relative: 6 %
Neutro Abs: 10465 cells/uL — ABNORMAL HIGH (ref 1500–7800)
Neutrophils Relative %: 65 %
Platelets: 390 10*3/uL (ref 140–400)
RBC: 3.94 MIL/uL (ref 3.80–5.10)
RDW: 14.4 % (ref 11.0–15.0)
WBC: 16.1 10*3/uL — ABNORMAL HIGH (ref 3.8–10.8)

## 2017-01-26 LAB — COMPLETE METABOLIC PANEL WITH GFR
ALT: 18 U/L (ref 6–29)
AST: 14 U/L (ref 10–35)
Albumin: 3.9 g/dL (ref 3.6–5.1)
Alkaline Phosphatase: 55 U/L (ref 33–130)
BUN: 22 mg/dL (ref 7–25)
CO2: 29 mmol/L (ref 20–31)
Calcium: 9.2 mg/dL (ref 8.6–10.4)
Chloride: 99 mmol/L (ref 98–110)
Creat: 1.11 mg/dL — ABNORMAL HIGH (ref 0.50–1.05)
GFR, Est African American: 64 mL/min (ref >=60)
GFR, Est Non African American: 55 mL/min — ABNORMAL LOW (ref >=60)
Glucose, Bld: 261 mg/dL — ABNORMAL HIGH (ref 65–99)
Potassium: 4.3 mmol/L (ref 3.5–5.3)
Sodium: 138 mmol/L (ref 135–146)
Total Bilirubin: 0.3 mg/dL (ref 0.2–1.2)
Total Protein: 6.1 g/dL (ref 6.1–8.1)

## 2017-01-26 NOTE — Progress Notes (Signed)
Chief complaint: followup for calcaneal osteomyelitis  Subjective:    Patient ID: [name], female    DOB: 07-May-1961, 56 y.o.   MRN: [id number]  HPI  56 year old female with multiple medical problems who initially had a fracture of her 5th metatarsal and developed non union. Underwent ORIF with bone grafting but then fell and broke the 5th metatarsal again. She underwent hardware removal and ORIF again  with midfoot reconstruction. The calcaneal osteotomy did not heal well and in May 2016 noted draiange, which she describes as white, pus like drainage. Developed swelling of foot and leg. She was started on Septra DS, 2 pills bid with rifampin though did not resolve. Underwent I and D in [place] with associated hardware removal but presented here to Dr. Percell Miller with persistent drainage. She underwent I and D and superficial swab with MRSA and CoNS, no bone culture done. She was started on and has continued with vancomycin and rifampin. Unfortunately, she developed AKI and was reshospitalized. Her creat was increased to 1.87 and vancomycin stopped and started linezolid. Her creat remained stable, though still up from her baseline at 1.68 after her visit to Dr. Yong Channel. She was sent out on doxycyline after her insurace refused linezolid coverage. She developed a rash with this but continued the doxycyline, until seen by Dr. Linus Salmons roughly 16 days ago. She was taken off doxycycline but since has seen Dr. Para March who is concerned that the patient had worsening left heel pain which is hard for her now to bear. Patient had labs done which showed an elevated sedimentation rate above 50. 4 to layer creatinine had now improved to 1.35. See labs of lobe picture.  She had  been restarted on doxycycline and has had re-emergence of her rash is intensely pruritic  When I later saw  her I was concerned for worsening osteomyelitis and obtained MRI of foot on 06/03/15 which showed:  IMPRESSION: 1.  There is evidence of prior calcaneal osteotomy of the left posterior calcaneus with hardware removal. There is a soft tissue wound along the posterior lateral aspect of the left hindfoot extending to the lateral osteotomy site of the calcaneus. There is soft tissue enhancement and osseous enhancement with mild cortical irregularity along the lateral aspect of the calcaneal osteotomy site which may reflect postsurgical granulation tissue, but osteomyelitis cannot be completely excluded. No drainable fluid collection.  She was seen b y Dr. Sharol Given with orthopedic surgery. I had stopped her doxycyline in hopes that we might obtain helpful cultures in the OR to guide therapy if she underwent surgery   She  was seen by Dr Sharol Given who restarted her oral doxycycline and then admitted her to [facility] where he performed Partial Excision Left Calcaneus, Place Antibiotic Beads, and Wound VAC on 06/27/15. The patient states that she has not been on ANY antibiotics postoperatively whatsoever. Her cultures on doxycycline did not yield any organism. She has NOT had worsening of pain or drainage since her surgery which was a month prior to my seeing her again. I DID propose however that she undergo course of rx with IV antibiotics to maximize her chances of control, cure of this calcaneal osteomyelitis and we started IV daptomicin to which she apparently developed a rash. This was stopped and she was changed to IV teflaro which she also developed a rash. She was NOT able to get through the 8 weeks of IV abx we had planned instead roughly 32 days of IV antibiotics. We  did not switch her to an oral antibiotic due to her multiple allergies.   Since coming off antibiotics she had experienced worsening pain in her heel both with weight bearing and at rest. No nausea vomiting, fevers or systemic symptoms.  Her inflammatory markers were up and we were worried and therefore he obtained an MRI in mid December which  showed:   IMPRESSION: 1. Interval progressive healing of calcaneal osteotomy. No evidence of osteomyelitis. 2. The lateral hindfoot soft tissue wound/tract demonstrates mild contraction. No evidence of soft tissue abscess. 3. Peroneal tenosynovitis and subcutaneous edema in the distal lower leg have mildly worsened.   She has had interval improvement in her wound and pain in her heel. She did have an admission to the hospital for a flare of her sarcoidosis that is not just involving her lungs but her eyes she is going to [facility] to see Dr. Casper Harrison for follow-up for this. She is also being seen by Dr. Prudencio Burly here in [place] with regards to the ophthalmic involvement.  Her inflammatory markers were elevated when she was inpatient but was  concerned well be due to her sarcoid.  She had been off antibiotics for some time and continues to be off antibiotics for heel pain has improved.  We did recheck ESR which was normal but CRP was up.  She had done well as far as her foot is concerned.  HOwever recently she had knee pain thought to be gout and had steroid pack (she takes '10mg'$  prednisone chronically) and was also given injection in knee. Her left ankle has begun to hurt (one where she had osteo) in past week and she is concerned.   She does take allopurinol for gout.     Past Medical History:  Diagnosis Date  . Abnormal SPEP 04/17/2014  . ANEMIA-UNSPECIFIED 09/18/2009  . CHF (congestive heart failure) ([place])   . Chronic diastolic heart failure, NYHA class 2 (HCC)    LVEDP roughly 20% by cath  . COPD (chronic obstructive pulmonary disease) ([place])   . Depression   . DIABETES MELLITUS, TYPE II 08/21/2006  . Diabetic osteomyelitis ([place]) 05/29/2015  . Exertional chest pain    sharp, substernal, exertional  . Fracture of 5th metatarsal    non union  . GERD 08/21/2006  . GOUT 08/20/2010  . Hx of umbilical hernia repair   . HYPERLIPIDEMIA 08/21/2006  . HYPERTENSION 08/21/2006  .  Infection of wound due to methicillin resistant Staphylococcus aureus (MRSA)   . Internal hemorrhoids   . Multiple allergies 10/14/2016  . Multiple allergies 10/14/2016  . OBESITY 06/04/2009  . Onychomycosis 10/27/2015  . Osteomyelitis of left foot ([place]) 05/29/2015  . Pulmonary sarcoidosis ([place])    Followed locally by pulmonology, but also by Dr. Casper Harrison at [facility]  . Vocal cord dysfunction   . Wears partial dentures     Past Surgical History:  Procedure Laterality Date  . ABDOMINAL HYSTERECTOMY    . APPENDECTOMY    . BLADDER SUSPENSION  11/11/2011   Procedure: TRANSVAGINAL TAPE (TVT) PROCEDURE;  Surgeon: Olga Millers, MD;  Location: [facility];  Service: Gynecology;  Laterality: N/A;  . CARDIAC CATHETERIZATION  07/2010   LVEF 50-55% WITH VERY MILD GLOBAL HYPOKINESIA; ESSENTIALLY NORMAL CORONARY ARTERIES; NORMAL LV FUNCTION  . CAROTIDS  02/18/11   CAROTID DUPLEX; VERTEBRALS ARE PATENT WITH ANTEGRADE FLOW. ICA/CCA RATIO 1.61 ON RIGHT AND 0.75 ON LEFT  . CHOLECYSTECTOMY  1984  . CYSTOSCOPY  11/11/2011   Procedure: CYSTOSCOPY;  Surgeon: Olga Millers, MD;  Location: [facility];  Service: Gynecology;  Laterality: N/A;  . DOPPLER ECHOCARDIOGRAPHY  02/12/2013   LV FUNCTION, SIZE NORMAL; MILD CONCENTRIC LVH; EST EF 55-65%; WALL MOTION NORMAL  . FRACTURE SURGERY     foot  . HERNIA REPAIR    . I&D EXTREMITY Left 06/27/2015   Procedure: Partial Excision Left Calcaneus, Place Antibiotic Beads, and Wound VAC;  Surgeon: Newt Minion, MD;  Location: [place];  Service: Orthopedics;  Laterality: Left;  . KNEE ARTHROSCOPY     right  . LEFT AND RIGHT HEART CATHETERIZATION WITH CORONARY ANGIOGRAM N/A 04/23/2013   Procedure: LEFT AND RIGHT HEART CATHETERIZATION WITH CORONARY ANGIOGRAM;  Surgeon: Leonie Man, MD;  Location: [facility];  Service: Cardiovascular;  Laterality: N/A;  . LexiScan Myoview  03/09/2013   EF 50%; NORMAL MYOCARDIAL PERFUSION STUDY - breast attenuation  . METATARSAL OSTEOTOMY  WITH OPEN REDUCTION INTERNAL FIXATION (ORIF) METATARSAL WITH FUSION Left 04/09/2014   Procedure: LEFT FOOT FRACTURE OPEN TREATMENT METATARSAL INCLUDES INTERNAL FIXATION EACH;  Surgeon: Lorn Junes, MD;  Location: [place];  Service: Orthopedics;  Laterality: Left;  . NISSEN FUNDOPLICATION  1497  . Right and left CARDIAC CATHETERIZATION  04/23/2013   Angiographic normal coronaries; LVEDP 20 mmHg, PCWP 12-14 mmHg, RAP 12 mmHg.; Fick CO/CI 4.9/2.2  . TUBAL LIGATION     with reversal in 1994  . VENTRAL HERNIA REPAIR      Family History  Problem Relation Age of Onset  . Diabetes Father   . Heart attack Father   . Coronary artery disease Father   . Heart failure Father   . COPD Mother   . Emphysema Mother   . Asthma Mother   . Heart failure Mother   . Heart attack Maternal Grandfather   . Sarcoidosis Maternal Uncle   . Lung cancer Brother   . Diabetes Brother   . Colon cancer Neg Hx       Social History   Social History  . Marital status: Married    Spouse name: YUSRA RAVERT  . Number of children: 2  . Years of education: 12   Occupational History  . DISABLED    Social History Main Topics  . Smoking status: Never Smoker  . Smokeless tobacco: Never Used  . Alcohol use No  . Drug use: No  . Sexual activity: Yes    Birth control/ protection: Surgical   Other Topics Concern  . Not on file   Social History Narrative   Married 1994. 2 sons who both live close and 1 grandson.       Disability due to sarcoidosis. 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 Methotrexate Other (See Comments)     peri-oral and buccal lesions.  . Vancomycin Other (See Comments)    nephrotoxicity  . Clindamycin/Lincomycin Nausea And Vomiting and Rash  . Chlorhexidine Itching  . Doxycycline  Rash  . Lisinopril Cough  . Teflaro [Ceftaroline] Rash     Current Outpatient Prescriptions:  .  albuterol (PROVENTIL HFA;VENTOLIN HFA) 108 (90 BASE) MCG/ACT inhaler, Inhale 1-2 puffs into the lungs every 6 (six) hours as needed for wheezing or shortness of breath., Disp: 8 g, Rfl: 5 .  albuterol (PROVENTIL) (2.5 MG/3ML) 0.083% nebulizer solution, Take  3 mLs (2.5 mg total) by nebulization every 6 (six) hours as needed for wheezing or shortness of breath., Disp: 150 mL, Rfl: 6 .  allopurinol (ZYLOPRIM) 100 MG tablet, Take 100 mg by mouth at bedtime. , Disp: , Rfl:  .  amoxicillin-clavulanate (AUGMENTIN) 875-125 MG tablet, Take 1 tablet by mouth 2 (two) times daily., Disp: 14 tablet, Rfl: 0 .  aspirin EC 81 MG tablet, Take 81 mg by mouth daily. , Disp: , Rfl:  .  atorvastatin (LIPITOR) 40 MG tablet, TAKE 1 TAB BY MOUTH ONCE DAILY, Disp: 90 tablet, Rfl: 3 .  benzonatate (TESSALON) 100 MG capsule, Take 100 mg by mouth 3 (three) times daily as needed for cough. Reported on 01/05/2016, Disp: , Rfl:  .  buPROPion (WELLBUTRIN XL) 300 MG 24 hr tablet, TAKE 1 TABLET (300 MG TOTAL) BY MOUTH DAILY., Disp: 90 tablet, Rfl: 3 .  carvedilol (COREG) 25 MG tablet, Take 1 tablet (25 mg total) by mouth 2 (two) times daily with a meal., Disp: 180 tablet, Rfl: 3 .  chlorpheniramine-HYDROcodone (TUSSIONEX) 10-8 MG/5ML SUER, Take 5 mLs by mouth daily as needed for cough. Reported on 01/05/2016, Disp: , Rfl: 0 .  DEXILANT 60 MG capsule, TAKE ONE CAPSULE BY MOUTH EVERY DAY, Disp: 30 capsule, Rfl: 5 .  diltiazem (CARDIZEM CD) 120 MG 24 hr capsule, Take 1 capsule (120 mg total) by mouth daily., Disp: 90 capsule, Rfl: 3 .  estradiol (ESTRACE) 2 MG tablet, Take 2 mg by mouth at bedtime. , Disp: , Rfl:  .  fenofibrate (TRICOR) 145 MG tablet, TAKE 1 TABLET (145 MG TOTAL) BY MOUTH DAILY., Disp: 30 tablet, Rfl: 6 .  fluticasone (FLONASE) 50 MCG/ACT nasal spray, Place 2 sprays into both nostrils daily., Disp: 48 g, Rfl: 0 .   furosemide (LASIX) 40 MG tablet, TAKE 1 TABLET BY MOUTH TWICE A DAY, Disp: 60 tablet, Rfl: 11 .  gabapentin (NEURONTIN) 100 MG capsule, TAKE ONE CAPSULE BY MOUTH 3 TIMES A DAY, Disp: 90 capsule, Rfl: 0 .  glucose blood (ONE TOUCH ULTRA TEST) test strip, 1 each by Other route 3 (three) times daily. Use as instructed, Disp: 100 each, Rfl: 5 .  glyBURIDE (DIABETA) 2.5 MG tablet, Take 2.5 mg by mouth., Disp: , Rfl:  .  insulin aspart (NOVOLOG FLEXPEN) 100 UNIT/ML FlexPen, Inject 12-20 Units into the skin 3 (three) times daily with meals. Per sliding scale, Disp: 30 mL, Rfl: 5 .  Insulin Glargine (TOUJEO SOLOSTAR) 300 UNIT/ML SOPN, Inject 45 Units into the skin daily., Disp: 5 pen, Rfl: 5 .  Insulin Pen Needle 33G X 4 MM MISC, 1 each by Does not apply route 4 (four) times daily., Disp: 200 each, Rfl: 11 .  LORazepam (ATIVAN) 1 MG tablet, Take 5 mg by mouth. , Disp: , Rfl:  .  losartan (COZAAR) 50 MG tablet, TAKE 1 TABLET (50 MG TOTAL) BY MOUTH DAILY., Disp: , Rfl: 3 .  metFORMIN (GLUCOPHAGE) 1000 MG tablet, TAKE ONE TABLET BY MOUTH TWICE DAILY WITH MEALS, Disp: 180 tablet, Rfl: 0 .  mometasone-formoterol (DULERA) 200-5 MCG/ACT AERO, Inhale 2 puffs into the lungs 2 (two) times daily., Disp: 13 g, Rfl: 5 .  moxifloxacin (VIGAMOX) 0.5 % ophthalmic solution, INSTILL 1 DROP IN RIGHT EYE 4 X DAILY STARTING 2 DAYS PRIOR TO SURGERY& 2 DROPS MORNING OF SURGERY, Disp: , Rfl: 0 .  nitroGLYCERIN (NITROSTAT) 0.4 MG SL tablet, Place 0.4 mg under the tongue every 5 (five) minutes as needed. Reported on  01/05/2016, Disp: , Rfl:  .  omeprazole (PRILOSEC) 20 MG capsule, Take 20 mg by mouth., Disp: , Rfl:  .  ondansetron (ZOFRAN-ODT) 4 MG disintegrating tablet, Take 4 mg by mouth every 8 (eight) hours as needed for nausea or vomiting. Reported on 01/05/2016, Disp: , Rfl: 0 .  PARoxetine Mesylate (BRISDELLE) 7.5 MG CAPS, Take 7.5 mg by mouth daily., Disp: , Rfl:  .  Polyethyl Glycol-Propyl Glycol (SYSTANE) 0.4-0.3 % SOLN,  Apply 1 drop to eye at bedtime., Disp: , Rfl:  .  Potassium Chloride ER 20 MEQ TBCR, TAKE ONE TABLET BY MOUTH THREE TIMES DAILY, Disp: 90 tablet, Rfl: 3 .  potassium chloride SA (K-DUR,KLOR-CON) 20 MEQ tablet, Take 1 tablet (20 mEq total) by mouth 3 (three) times daily., Disp: 90 tablet, Rfl: 1 .  PREDNISONE PO, Take 5 mg by mouth daily. 20 mg through 10/15/16, Disp: , Rfl:  .  venlafaxine XR (EFFEXOR-XR) 75 MG 24 hr capsule, Take 2 capsules (150 mg total) by mouth at bedtime., Disp: , Rfl:  .  venlafaxine XR (EFFEXOR-XR) 75 MG 24 hr capsule, TAKE 1 CAPSULE (75 MG TOTAL) BY MOUTH 2 (TWO) TIMES DAILY., Disp: 180 capsule, Rfl: 3 .  vitamin B-12 (CYANOCOBALAMIN) 1000 MCG tablet, Take 1,000 mcg by mouth daily., Disp: , Rfl:       Review of Systems  Constitutional: Negative for activity change, appetite change, chills, diaphoresis, fatigue, fever and unexpected weight change.  HENT: Negative for congestion, rhinorrhea, sinus pressure, sneezing, sore throat and trouble swallowing.   Eyes: Negative for photophobia and visual disturbance.  Respiratory: Negative for cough, chest tightness, shortness of breath, wheezing and stridor.   Cardiovascular: Negative for chest pain, palpitations and leg swelling.  Gastrointestinal: Negative for abdominal distention, abdominal pain, anal bleeding, blood in stool, constipation, diarrhea, nausea and vomiting.  Genitourinary: Negative for difficulty urinating, dysuria, flank pain and hematuria.  Musculoskeletal: Positive for arthralgias and joint swelling. Negative for back pain, gait problem and myalgias.  Skin: Positive for rash. Negative for color change, pallor and wound.  Neurological: Negative for dizziness, tremors, weakness and light-headedness.  Hematological: Negative for adenopathy. Does not bruise/bleed easily.  Psychiatric/Behavioral: Negative for agitation, behavioral problems, confusion, decreased concentration, dysphoric mood and sleep disturbance.        Objective:   Physical Exam  Constitutional: She is oriented to person, place, and time. She appears well-developed and well-nourished. No distress.  HENT:  Head: Normocephalic and atraumatic.  Mouth/Throat: No oropharyngeal exudate.  Eyes: Conjunctivae and EOM are normal. No scleral icterus.  Neck: Normal range of motion. Neck supple.  Cardiovascular: Normal rate and regular rhythm.   Pulmonary/Chest: Effort normal. No respiratory distress. She has no wheezes.  Abdominal: She exhibits no distension.  Musculoskeletal: She exhibits no edema.       Right knee: Tenderness found.  Neurological: She is alert and oriented to person, place, and time. She exhibits normal muscle tone. Coordination normal.  Skin: Skin is warm and dry. She is not diaphoretic. No erythema.  Psychiatric: She has a normal mood and affect. Her behavior is normal. Judgment and thought content normal.     Left foot 05/29/2015:    Left heel today 01/26/16:       Heel continues to appear well-healed     Left foot 09/11/15: note a new scar from where she dropped a curling iron on her foot       10/14/16:           Assessment &  Plan:   56 year old with calcaneal osteomyelitis and metatarsal infection originally due to fracture also with diabetes mellitus. Above infection was complicated by presence of hardware. MRI in August showed osteomyelitis persisting sp surgery by Dr Sharol Given with deep cultures (though ON abx) and culture unrevealing. We tried IV abx but only got through 33 of planned 56 days due to rashes to every IV abx we had tried. MRi was reassuring.   She now has ankle pain  Will check ESR< CRP, CMP, CBC c diff, uric acid (since she is supposed to be taknig allopurinol to lower levels)  Plain films of ankle and foot on the left side  Gout; check uric acid  Sarcoid: on chronic steroids  Knee pain: no large effusion  Multiple allergies on various antibiotics: wonder if many of  these are driven by supratentorial or in fact subconscious suggestion or if she ireally is allergic to all of these different classes of antibiotics   I spent greater than 25 minutes with the patient including greater than 50% of time in face to face counsel of the patient re her prior calcaneal osteo, her gout, her knee and ankle pain aand in coordination of her care.

## 2017-01-27 LAB — SEDIMENTATION RATE: Sed Rate: 5 mm/hr (ref 0–30)

## 2017-01-27 LAB — URIC ACID: Uric Acid, Serum: 5.1 mg/dL (ref 2.5–7.0)

## 2017-01-27 LAB — C-REACTIVE PROTEIN: CRP: 17.3 mg/L — ABNORMAL HIGH (ref <8.0)

## 2017-01-28 ENCOUNTER — Telehealth: Payer: Self-pay | Admitting: *Deleted

## 2017-01-28 ENCOUNTER — Encounter: Payer: Self-pay | Admitting: Infectious Disease

## 2017-01-28 NOTE — Telephone Encounter (Signed)
Patient called for results of her xrays and labs, please advise and sent reply to triage.

## 2017-01-28 NOTE — Progress Notes (Signed)
Patients Plain films were reassuring and showed no evidence of bone infection. Her said rate is normal her CRP is trending down but still slightly elevated. I am reassured by her labs and I would convey that to her thanks Kennyth Lose

## 2017-02-01 ENCOUNTER — Telehealth: Payer: Self-pay | Admitting: *Deleted

## 2017-02-01 NOTE — Telephone Encounter (Signed)
RN shared lab and xray results with the patient.  Reminded her to improve control of her diabetes to help with healing.  Patient verbalized understanding.

## 2017-02-01 NOTE — Telephone Encounter (Signed)
[name] spoke to patient and gave results.

## 2017-02-03 DIAGNOSIS — M1711 Unilateral primary osteoarthritis, right knee: Secondary | ICD-10-CM | POA: Diagnosis not present

## 2017-02-08 ENCOUNTER — Ambulatory Visit (INDEPENDENT_AMBULATORY_CARE_PROVIDER_SITE_OTHER): Payer: BLUE CROSS/BLUE SHIELD | Admitting: Podiatry

## 2017-02-08 DIAGNOSIS — L03031 Cellulitis of right toe: Secondary | ICD-10-CM

## 2017-02-08 MED ORDER — SULFAMETHOXAZOLE-TRIMETHOPRIM 800-160 MG PO TABS: 2.0000 | ORAL_TABLET | Freq: Two times a day (BID) | ORAL | 1 refills | Status: DC

## 2017-02-08 MED ORDER — NEOMYCIN-POLYMYXIN-HC 1 % OT SOLN: OTIC | 1 refills | Status: DC

## 2017-02-08 NOTE — Progress Notes (Signed)
She presents today with chief complaint of a painful great toe right.  Objective: I have reviewed her past mental history medications allergies surgery social history. She has a history of diabetes but pulses remain strong and palpable. Hallux nail appears to have been traumatized due to injury. There is surrounding paronychia.  Assessment: Paronychia Says hallux right with no mycosis onychocryptosis and diabetes.  Plan: Incision and drainage of paronychia with nail avulsion. This performed a local anesthetic was injected about the right hallux. She tolerated the procedure well without complications and I will follow-up with her in 1-2 weeks. She is provided with both oral and written home-going instructions for the care and soaking of her toe.

## 2017-02-08 NOTE — Patient Instructions (Signed)
Betadine Soak Instructions  Purchase an 8 oz. bottle of BETADINE solution (Povidone)  THE DAY AFTER THE PROCEDURE  Place 1 tablespoon of betadine solution in a quart of warm tap water.  Submerge your foot or feet with outer bandage intact for the initial soak; this will allow the bandage to become moist and wet for easy lift off.  Once you remove your bandage, continue to soak in the solution for 20 minutes.  This soak should be done twice a day.  Next, remove your foot or feet from solution, blot dry the affected area and cover.  You may use a band aid large enough to cover the area or use gauze and tape.  Apply other medications to the area as directed by the doctor such as cortisporin otic solution (ear drops) or neosporin.  IF YOUR SKIN BECOMES IRRITATED WHILE USING THESE INSTRUCTIONS, IT IS OKAY TO SWITCH TO EPSOM SALTS AND WATER OR WHITE VINEGAR AND WATER. 

## 2017-02-22 ENCOUNTER — Ambulatory Visit: Payer: BLUE CROSS/BLUE SHIELD | Admitting: Podiatry

## 2017-02-24 DIAGNOSIS — M1711 Unilateral primary osteoarthritis, right knee: Secondary | ICD-10-CM | POA: Diagnosis not present

## 2017-02-25 ENCOUNTER — Ambulatory Visit: Payer: BLUE CROSS/BLUE SHIELD | Admitting: Internal Medicine

## 2017-02-28 ENCOUNTER — Telehealth: Payer: Self-pay | Admitting: Family Medicine

## 2017-02-28 ENCOUNTER — Other Ambulatory Visit: Payer: Self-pay | Admitting: Family Medicine

## 2017-02-28 NOTE — Telephone Encounter (Signed)
° ° °  I scheduled pt for an appt on Wednesday 03/02/17 The reason for the appt is they are having her do PT  For her knee pain and they told her to follow up with her pcp for pain medicine .

## 2017-03-01 DIAGNOSIS — M25562 Pain in left knee: Secondary | ICD-10-CM | POA: Diagnosis not present

## 2017-03-01 DIAGNOSIS — M6281 Muscle weakness (generalized): Secondary | ICD-10-CM | POA: Diagnosis not present

## 2017-03-01 DIAGNOSIS — M25561 Pain in right knee: Secondary | ICD-10-CM | POA: Diagnosis not present

## 2017-03-01 DIAGNOSIS — M25661 Stiffness of right knee, not elsewhere classified: Secondary | ICD-10-CM | POA: Diagnosis not present

## 2017-03-01 NOTE — Telephone Encounter (Signed)
Noted thank you

## 2017-03-02 ENCOUNTER — Encounter: Payer: Self-pay | Admitting: Family Medicine

## 2017-03-02 ENCOUNTER — Ambulatory Visit (INDEPENDENT_AMBULATORY_CARE_PROVIDER_SITE_OTHER): Payer: BLUE CROSS/BLUE SHIELD | Admitting: Family Medicine

## 2017-03-02 VITALS — BP 112/56 | HR 82 | Temp 98.2°F | Ht 66.0 in | Wt 250.8 lb

## 2017-03-02 DIAGNOSIS — M1711 Unilateral primary osteoarthritis, right knee: Secondary | ICD-10-CM | POA: Diagnosis not present

## 2017-03-02 DIAGNOSIS — Z23 Encounter for immunization: Secondary | ICD-10-CM | POA: Diagnosis not present

## 2017-03-02 MED ORDER — DICLOFENAC SODIUM 1 % TD GEL: 2.0000 g | Freq: Four times a day (QID) | TRANSDERMAL | 0 refills | Status: DC

## 2017-03-02 MED ORDER — TRAMADOL HCL 50 MG PO TABS: 50.0000 mg | ORAL_TABLET | Freq: Two times a day (BID) | ORAL | 2 refills | Status: DC | PRN

## 2017-03-02 MED ORDER — ORLISTAT 120 MG PO CAPS: 120.0000 mg | ORAL_CAPSULE | Freq: Three times a day (TID) | ORAL | 2 refills | Status: DC

## 2017-03-02 NOTE — Assessment & Plan Note (Signed)
S: Dr. Noemi Chapel said she has bone on bone arthritis in the right knee. Saw him last week. Started physical therapy. She startedbrace. Pain can be up to 10/10 with walking. Viscous supplementation x 3 and not helping much. Was not not a surgical candidate due to high infection risk. Next steroid shot not possible until august. Tylenol mininmal help. Avoids nsaids like ibuprofen due to high blood pressure and cardiac concerns. aspercreme does not help.   complained of right knee pain to ID at visit about a month ago. Mild degenerative changes left knee noted on x-ray 2013 A/P: we opted to start with voltaren gel for R knee osteoarthritis as tylenol doesn't help, she is concerned about chronic nsaid use. She will have some tramadol for back up (advised of serotonin syndrome risk- will give handout at follow up). Checked [place] drug database- has only had rx from ortho x1 and one from urgent care last fall- pattern not majorly concerning

## 2017-03-02 NOTE — Progress Notes (Signed)
Subjective:  [name] is a 56 y.o. year old very pleasant female patient who presents for/with See problem oriented charting ROS- no recent breathing issues beyond baseline. No chest pain. Does have right knee pain. No worsening edema   Past Medical History-  Patient Active Problem List   Diagnosis Date Noted  . Obstructive chronic bronchitis without exacerbation ([place]) 09/18/2013    Priority: High  . Chest pain - at rest, and with exertion; -- angiographically normal coronary arteries, mildly elevated LVEDP 04/11/2013    Priority: High  . Hypertensive heart disease with chronic diastolic congestive heart failure ([place])     Priority: High  . Sarcoidosis of lung ([place]) 04/10/2007    Priority: High  . Type II diabetes mellitus with neurological manifestations, uncontrolled ([place]) 08/21/2006    Priority: High  . Osteoarthritis of right knee 01/26/2017    Priority: Medium  . Fatty liver 09/30/2014    Priority: Medium  . Depression     Priority: Medium  . Gout 08/20/2010    Priority: Medium  . Anemia 09/18/2009    Priority: Medium  . Sleep apnea 04/21/2009    Priority: Medium  . Hypertriglyceridemia 08/21/2006    Priority: Medium  . Essential hypertension 08/21/2006    Priority: Medium  . Onychomycosis 10/27/2015    Priority: Low  . Hot flashes 07/15/2014    Priority: Low  . Abnormal SPEP 04/17/2014    Priority: Low  . Fracture of left leg 04/17/2014    Priority: Low  . Cushingoid side effect of steroids ([place]) 04/17/2014    Priority: Low  . Internal hemorrhoids     Priority: Low  . Preoperative clearance 03/25/2014    Priority: Low  . Solitary pulmonary nodule, on CT 02/2013 - stable over 2 years in 2015 02/20/2013    Priority: Low  . Morbid obesity ([place]) 06/04/2009    Priority: Low  . GERD 08/21/2006    Priority: Low  . Acute left ankle pain 01/26/2017  . Multiple allergies 10/14/2016  . Osteomyelitis of left foot ([place]) 05/29/2015  . MRSA (methicillin resistant  staph aureus) culture positive 03/27/2015  . Wound infection complicating hardware ([place]) 03/27/2015    Medications- reviewed and updated Current Outpatient Prescriptions  Medication Sig Dispense Refill  . albuterol (PROVENTIL HFA;VENTOLIN HFA) 108 (90 BASE) MCG/ACT inhaler Inhale 1-2 puffs into the lungs every 6 (six) hours as needed for wheezing or shortness of breath. 8 g 5  . albuterol (PROVENTIL) (2.5 MG/3ML) 0.083% nebulizer solution Take 3 mLs (2.5 mg total) by nebulization every 6 (six) hours as needed for wheezing or shortness of breath. 150 mL 6  . allopurinol (ZYLOPRIM) 100 MG tablet Take 100 mg by mouth at bedtime.     [name] aspirin EC 81 MG tablet Take 81 mg by mouth daily.     [name] atorvastatin (LIPITOR) 40 MG tablet TAKE 1 TAB BY MOUTH ONCE DAILY 90 tablet 3  . benzonatate (TESSALON) 100 MG capsule Take 100 mg by mouth 3 (three) times daily as needed for cough. Reported on 01/05/2016    . buPROPion (WELLBUTRIN XL) 300 MG 24 hr tablet TAKE 1 TABLET (300 MG TOTAL) BY MOUTH DAILY. 90 tablet 3  . carvedilol (COREG) 25 MG tablet Take 1 tablet (25 mg total) by mouth 2 (two) times daily with a meal. 180 tablet 3  . chlorpheniramine-HYDROcodone (TUSSIONEX) 10-8 MG/5ML SUER Take 5 mLs by mouth daily as needed for cough. Reported on 01/05/2016  0  . DEXILANT 60  MG capsule TAKE ONE CAPSULE BY MOUTH EVERY DAY 30 capsule 5  . diltiazem (CARDIZEM CD) 120 MG 24 hr capsule Take 1 capsule (120 mg total) by mouth daily. 90 capsule 3  . estradiol (ESTRACE) 2 MG tablet Take 2 mg by mouth at bedtime.     . fenofibrate (TRICOR) 145 MG tablet TAKE 1 TABLET (145 MG TOTAL) BY MOUTH DAILY. 30 tablet 6  . fluticasone (FLONASE) 50 MCG/ACT nasal spray Place 2 sprays into both nostrils daily. 48 g 0  . furosemide (LASIX) 40 MG tablet TAKE 1 TABLET BY MOUTH TWICE A DAY 60 tablet 11  . gabapentin (NEURONTIN) 100 MG capsule TAKE ONE CAPSULE BY MOUTH 3 TIMES A DAY 90 capsule 0  . glucose blood (ONE TOUCH ULTRA TEST) test  strip 1 each by Other route 3 (three) times daily. Use as instructed 100 each 5  . glyBURIDE (DIABETA) 2.5 MG tablet Take 2.5 mg by mouth.    . insulin aspart (NOVOLOG FLEXPEN) 100 UNIT/ML FlexPen Inject 12-20 Units into the skin 3 (three) times daily with meals. Per sliding scale 30 mL 5  . Insulin Glargine (TOUJEO SOLOSTAR) 300 UNIT/ML SOPN Inject 45 Units into the skin daily. 5 pen 5  . Insulin Pen Needle 33G X 4 MM MISC 1 each by Does not apply route 4 (four) times daily. 200 each 11  . LORazepam (ATIVAN) 1 MG tablet Take 5 mg by mouth.     . losartan (COZAAR) 50 MG tablet TAKE 1 TABLET (50 MG TOTAL) BY MOUTH DAILY.  3  . metFORMIN (GLUCOPHAGE) 1000 MG tablet TAKE ONE TABLET BY MOUTH TWICE DAILY WITH MEALS 180 tablet 0  . mometasone-formoterol (DULERA) 200-5 MCG/ACT AERO Inhale 2 puffs into the lungs 2 (two) times daily. 13 g 5  . moxifloxacin (VIGAMOX) 0.5 % ophthalmic solution INSTILL 1 DROP IN RIGHT EYE 4 X DAILY STARTING 2 DAYS PRIOR TO SURGERY& 2 DROPS MORNING OF SURGERY  0  . NEOMYCIN-POLYMYXIN-HYDROCORTISONE (CORTISPORIN) 1 % SOLN otic solution Apply 1-2 drops to toe BID after soaking 10 mL 1  . nitroGLYCERIN (NITROSTAT) 0.4 MG SL tablet Place 0.4 mg under the tongue every 5 (five) minutes as needed. Reported on 01/05/2016    . omeprazole (PRILOSEC) 20 MG capsule Take 20 mg by mouth.    . ondansetron (ZOFRAN-ODT) 4 MG disintegrating tablet Take 4 mg by mouth every 8 (eight) hours as needed for nausea or vomiting. Reported on 01/05/2016  0  . PARoxetine Mesylate (BRISDELLE) 7.5 MG CAPS Take 7.5 mg by mouth daily.    Vladimir Faster Glycol-Propyl Glycol (SYSTANE) 0.4-0.3 % SOLN Apply 1 drop to eye at bedtime.    . Potassium Chloride ER 20 MEQ TBCR TAKE ONE TABLET BY MOUTH THREE TIMES DAILY 90 tablet 3  . PREDNISONE PO Take 5 mg by mouth daily. 20 mg through 10/15/16    . sulfamethoxazole-trimethoprim (BACTRIM DS,SEPTRA DS) 800-160 MG tablet Take 2 tablets by mouth 2 (two) times daily. 40 tablet 1   . venlafaxine XR (EFFEXOR-XR) 75 MG 24 hr capsule Take 2 capsules (150 mg total) by mouth at bedtime.    . vitamin B-12 (CYANOCOBALAMIN) 1000 MCG tablet Take 1,000 mcg by mouth daily.     No current facility-administered medications for this visit.     Objective: BP (!) 112/56 (BP Location: Left Arm, Patient Position: Sitting, Cuff Size: Large)   Pulse 82   Temp 98.2 F (36.8 C) (Oral)   Ht 5\' 6"  (1.676 m)  Wt 250 lb 12.8 oz (113.8 kg)   SpO2 98%   BMI 40.48 kg/m  Gen: NAD, resting comfortably, supraclavicular fullness, buffalo hump CV: RRR no murmurs rubs or gallops Lungs: CTAB no crackles, wheeze, rhonchi Abdomen: morbid obesity Ext: no edema Skin: warm, dry  Assessment/Plan:  Osteoarthritis of right knee S: Dr. Noemi Chapel said she has bone on bone arthritis in the right knee. Saw him last week. Started physical therapy. She startedbrace. Pain can be up to 10/10 with walking. Viscous supplementation x 3 and not helping much. Was not not a surgical candidate due to high infection risk. Next steroid shot not possible until august. Tylenol mininmal help. Avoids nsaids like ibuprofen due to high blood pressure and cardiac concerns. aspercreme does not help.   complained of right knee pain to ID at visit about a month ago. Mild degenerative changes left knee noted on x-ray 2013 A/P: we opted to start with voltaren gel for R knee osteoarthritis as tylenol doesn't help, she is concerned about chronic nsaid use. She will have some tramadol for back up (advised of serotonin syndrome risk- will give handout at follow up). Checked [place] drug database- has only had rx from ortho x1 and one from urgent care last fall- pattern not majorly concerning  Morbid obesity ([place]) S: weight watchers for 1-2 months. Lost 5 months. She thought this was a failure. Does not want to try GLP1 agonist for diabetes because of side effects a friend had with this.  A/P: she asks me about appetite suppressant- I am  concerned of cardiac risks of somtehing like phentermine. We agreed to trial orlistat as long as she restarts her weight watchers which seemed to be effective.   Will have to watch vitamin D- instructed her to use this before orlistat use each day  2 month follow up  Repeat shingrix at follow up likely Orders Placed This Encounter  Procedures  . Varicella-zoster vaccine IM    Meds ordered this encounter  Medications  . diclofenac sodium (VOLTAREN) 1 % GEL    Sig: Apply 2 g topically 4 (four) times daily.    Dispense:  100 g    Refill:  0  . traMADol (ULTRAM) 50 MG tablet    Sig: Take 1 tablet (50 mg total) by mouth every 12 (twelve) hours as needed for moderate pain or severe pain.    Dispense:  30 tablet    Refill:  2  . orlistat (XENICAL) 120 MG capsule    Sig: Take 1 capsule (120 mg total) by mouth 3 (three) times daily with meals.    Dispense:  90 capsule    Refill:  2    Return precautions advised.  Garret Reddish, MD

## 2017-03-02 NOTE — Patient Instructions (Signed)
Start with voltaren gel for knee pain. If it gets you down to a 7/10 just continue that. If above that can use sparing tramadol  Follow up in 2 months  Trial orlistat for weight loss. Restart weight watchers. Does not look like from my end they will cover the orlistat. There is an over the counter less strong version you could ask pharmacy about  Take vitamin D first thing in AM to avoid the orlistat affecting it

## 2017-03-02 NOTE — Assessment & Plan Note (Signed)
S: weight watchers for 1-2 months. Lost 5 months. She thought this was a failure. Does not want to try GLP1 agonist for diabetes because of side effects a friend had with this.  A/P: she asks me about appetite suppressant- I am concerned of cardiac risks of somtehing like phentermine. We agreed to trial orlistat as long as she restarts her weight watchers which seemed to be effective.   Will have to watch vitamin D- instructed her to use this before orlistat use each day

## 2017-03-03 ENCOUNTER — Telehealth: Payer: Self-pay

## 2017-03-03 NOTE — Telephone Encounter (Signed)
Received PA request for Diclofenac Sodium gel. PA submitted & is approved. Form faxed back to pharmacy.

## 2017-03-08 ENCOUNTER — Ambulatory Visit (INDEPENDENT_AMBULATORY_CARE_PROVIDER_SITE_OTHER): Payer: Self-pay | Admitting: Podiatry

## 2017-03-08 DIAGNOSIS — L03031 Cellulitis of right toe: Secondary | ICD-10-CM

## 2017-03-08 DIAGNOSIS — E1142 Type 2 diabetes mellitus with diabetic polyneuropathy: Secondary | ICD-10-CM

## 2017-03-08 NOTE — Progress Notes (Signed)
She presents today for follow-up of her nail avulsion hallux left. She states is doing very well and has had no problems with the toe.  Objective: Vital signs are stable alert and oriented 3. Pulses are palpable. Nailbed appears to be healing well. No signs of infection.  Assessment: Well-healed surgical toe hallux left.  Plan: Toenail avulsion hallux left healing well without complication follow up with me as needed.

## 2017-03-09 ENCOUNTER — Emergency Department (HOSPITAL_COMMUNITY): Payer: BLUE CROSS/BLUE SHIELD

## 2017-03-09 ENCOUNTER — Other Ambulatory Visit: Payer: Self-pay

## 2017-03-09 ENCOUNTER — Encounter (HOSPITAL_COMMUNITY): Payer: Self-pay | Admitting: Emergency Medicine

## 2017-03-09 ENCOUNTER — Observation Stay (HOSPITAL_COMMUNITY)
Admission: EM | Admit: 2017-03-09 | Discharge: 2017-03-12 | Disposition: A | Discharge status: Home / Self Care | Payer: BLUE CROSS/BLUE SHIELD | Attending: Internal Medicine | Admitting: Internal Medicine

## 2017-03-09 DIAGNOSIS — Z888 Allergy status to other drugs, medicaments and biological substances status: Secondary | ICD-10-CM | POA: Insufficient documentation

## 2017-03-09 DIAGNOSIS — R911 Solitary pulmonary nodule: Secondary | ICD-10-CM | POA: Insufficient documentation

## 2017-03-09 DIAGNOSIS — Z881 Allergy status to other antibiotic agents status: Secondary | ICD-10-CM | POA: Insufficient documentation

## 2017-03-09 DIAGNOSIS — N183 Chronic kidney disease, stage 3 unspecified: Secondary | ICD-10-CM | POA: Diagnosis present

## 2017-03-09 DIAGNOSIS — Z6841 Body Mass Index (BMI) 40.0 and over, adult: Secondary | ICD-10-CM | POA: Diagnosis not present

## 2017-03-09 DIAGNOSIS — R0789 Other chest pain: Secondary | ICD-10-CM | POA: Diagnosis not present

## 2017-03-09 DIAGNOSIS — D649 Anemia, unspecified: Secondary | ICD-10-CM | POA: Diagnosis present

## 2017-03-09 DIAGNOSIS — F325 Major depressive disorder, single episode, in full remission: Secondary | ICD-10-CM | POA: Diagnosis present

## 2017-03-09 DIAGNOSIS — Z7982 Long term (current) use of aspirin: Secondary | ICD-10-CM | POA: Insufficient documentation

## 2017-03-09 DIAGNOSIS — E1169 Type 2 diabetes mellitus with other specified complication: Secondary | ICD-10-CM | POA: Diagnosis present

## 2017-03-09 DIAGNOSIS — E1165 Type 2 diabetes mellitus with hyperglycemia: Secondary | ICD-10-CM | POA: Insufficient documentation

## 2017-03-09 DIAGNOSIS — Z794 Long term (current) use of insulin: Secondary | ICD-10-CM | POA: Insufficient documentation

## 2017-03-09 DIAGNOSIS — E781 Pure hyperglyceridemia: Secondary | ICD-10-CM | POA: Insufficient documentation

## 2017-03-09 DIAGNOSIS — Z801 Family history of malignant neoplasm of trachea, bronchus and lung: Secondary | ICD-10-CM | POA: Insufficient documentation

## 2017-03-09 DIAGNOSIS — K219 Gastro-esophageal reflux disease without esophagitis: Secondary | ICD-10-CM | POA: Diagnosis not present

## 2017-03-09 DIAGNOSIS — F339 Major depressive disorder, recurrent, unspecified: Secondary | ICD-10-CM | POA: Diagnosis not present

## 2017-03-09 DIAGNOSIS — I5032 Chronic diastolic (congestive) heart failure: Secondary | ICD-10-CM | POA: Insufficient documentation

## 2017-03-09 DIAGNOSIS — I493 Ventricular premature depolarization: Secondary | ICD-10-CM | POA: Insufficient documentation

## 2017-03-09 DIAGNOSIS — F329 Major depressive disorder, single episode, unspecified: Secondary | ICD-10-CM | POA: Diagnosis not present

## 2017-03-09 DIAGNOSIS — Z8249 Family history of ischemic heart disease and other diseases of the circulatory system: Secondary | ICD-10-CM | POA: Insufficient documentation

## 2017-03-09 DIAGNOSIS — J449 Chronic obstructive pulmonary disease, unspecified: Secondary | ICD-10-CM | POA: Insufficient documentation

## 2017-03-09 DIAGNOSIS — E1140 Type 2 diabetes mellitus with diabetic neuropathy, unspecified: Secondary | ICD-10-CM | POA: Diagnosis not present

## 2017-03-09 DIAGNOSIS — E785 Hyperlipidemia, unspecified: Secondary | ICD-10-CM | POA: Diagnosis not present

## 2017-03-09 DIAGNOSIS — E1122 Type 2 diabetes mellitus with diabetic chronic kidney disease: Secondary | ICD-10-CM | POA: Diagnosis not present

## 2017-03-09 DIAGNOSIS — Z825 Family history of asthma and other chronic lower respiratory diseases: Secondary | ICD-10-CM | POA: Insufficient documentation

## 2017-03-09 DIAGNOSIS — D508 Other iron deficiency anemias: Secondary | ICD-10-CM | POA: Diagnosis not present

## 2017-03-09 DIAGNOSIS — R079 Chest pain, unspecified: Secondary | ICD-10-CM | POA: Diagnosis present

## 2017-03-09 DIAGNOSIS — D860 Sarcoidosis of lung: Secondary | ICD-10-CM | POA: Diagnosis present

## 2017-03-09 DIAGNOSIS — K760 Fatty (change of) liver, not elsewhere classified: Secondary | ICD-10-CM | POA: Insufficient documentation

## 2017-03-09 DIAGNOSIS — E1159 Type 2 diabetes mellitus with other circulatory complications: Secondary | ICD-10-CM | POA: Diagnosis present

## 2017-03-09 DIAGNOSIS — Z79899 Other long term (current) drug therapy: Secondary | ICD-10-CM | POA: Diagnosis not present

## 2017-03-09 DIAGNOSIS — M25561 Pain in right knee: Secondary | ICD-10-CM | POA: Diagnosis not present

## 2017-03-09 DIAGNOSIS — R0602 Shortness of breath: Secondary | ICD-10-CM | POA: Diagnosis not present

## 2017-03-09 DIAGNOSIS — M25661 Stiffness of right knee, not elsewhere classified: Secondary | ICD-10-CM | POA: Diagnosis not present

## 2017-03-09 DIAGNOSIS — E1129 Type 2 diabetes mellitus with other diabetic kidney complication: Secondary | ICD-10-CM | POA: Diagnosis present

## 2017-03-09 DIAGNOSIS — Z833 Family history of diabetes mellitus: Secondary | ICD-10-CM | POA: Insufficient documentation

## 2017-03-09 DIAGNOSIS — F419 Anxiety disorder, unspecified: Secondary | ICD-10-CM | POA: Insufficient documentation

## 2017-03-09 DIAGNOSIS — E1149 Type 2 diabetes mellitus with other diabetic neurological complication: Secondary | ICD-10-CM | POA: Insufficient documentation

## 2017-03-09 DIAGNOSIS — Z9884 Bariatric surgery status: Secondary | ICD-10-CM | POA: Insufficient documentation

## 2017-03-09 DIAGNOSIS — M25562 Pain in left knee: Secondary | ICD-10-CM | POA: Diagnosis not present

## 2017-03-09 DIAGNOSIS — I130 Hypertensive heart and chronic kidney disease with heart failure and stage 1 through stage 4 chronic kidney disease, or unspecified chronic kidney disease: Secondary | ICD-10-CM | POA: Insufficient documentation

## 2017-03-09 DIAGNOSIS — D631 Anemia in chronic kidney disease: Secondary | ICD-10-CM | POA: Diagnosis not present

## 2017-03-09 DIAGNOSIS — I152 Hypertension secondary to endocrine disorders: Secondary | ICD-10-CM | POA: Diagnosis present

## 2017-03-09 DIAGNOSIS — M109 Gout, unspecified: Secondary | ICD-10-CM | POA: Diagnosis not present

## 2017-03-09 DIAGNOSIS — Z8614 Personal history of Methicillin resistant Staphylococcus aureus infection: Secondary | ICD-10-CM | POA: Insufficient documentation

## 2017-03-09 DIAGNOSIS — M6281 Muscle weakness (generalized): Secondary | ICD-10-CM | POA: Diagnosis not present

## 2017-03-09 DIAGNOSIS — D539 Nutritional anemia, unspecified: Secondary | ICD-10-CM | POA: Diagnosis present

## 2017-03-09 DIAGNOSIS — E1142 Type 2 diabetes mellitus with diabetic polyneuropathy: Secondary | ICD-10-CM | POA: Insufficient documentation

## 2017-03-09 DIAGNOSIS — I10 Essential (primary) hypertension: Secondary | ICD-10-CM | POA: Diagnosis not present

## 2017-03-09 DIAGNOSIS — N1832 Chronic kidney disease, stage 3b: Secondary | ICD-10-CM | POA: Diagnosis present

## 2017-03-09 LAB — TSH: TSH: 2.665 u[IU]/mL (ref 0.350–4.500)

## 2017-03-09 LAB — CBC
HCT: 32.4 % — ABNORMAL LOW (ref 36.0–46.0)
Hemoglobin: 10.3 g/dL — ABNORMAL LOW (ref 12.0–15.0)
MCH: 27.7 pg (ref 26.0–34.0)
MCHC: 31.8 g/dL (ref 30.0–36.0)
MCV: 87.1 fL (ref 78.0–100.0)
Platelets: 438 10*3/uL — ABNORMAL HIGH (ref 150–400)
RBC: 3.72 MIL/uL — ABNORMAL LOW (ref 3.87–5.11)
RDW: 14.9 % (ref 11.5–15.5)
WBC: 13.9 10*3/uL — ABNORMAL HIGH (ref 4.0–10.5)

## 2017-03-09 LAB — BASIC METABOLIC PANEL
Anion gap: 9 (ref 5–15)
BUN: 21 mg/dL — ABNORMAL HIGH (ref 6–20)
CO2: 27 mmol/L (ref 22–32)
Calcium: 9.5 mg/dL (ref 8.9–10.3)
Chloride: 101 mmol/L (ref 101–111)
Creatinine, Ser: 1.33 mg/dL — ABNORMAL HIGH (ref 0.44–1.00)
GFR calc Af Amer: 50 mL/min — ABNORMAL LOW (ref >60)
GFR calc non Af Amer: 43 mL/min — ABNORMAL LOW (ref >60)
Glucose, Bld: 201 mg/dL — ABNORMAL HIGH (ref 65–99)
Potassium: 4.5 mmol/L (ref 3.5–5.1)
Sodium: 137 mmol/L (ref 135–145)

## 2017-03-09 LAB — BRAIN NATRIURETIC PEPTIDE: B Natriuretic Peptide: 47.6 pg/mL (ref 0.0–100.0)

## 2017-03-09 LAB — I-STAT TROPONIN, ED: Troponin i, poc: 0 ng/mL (ref 0.00–0.08)

## 2017-03-09 LAB — GLUCOSE, CAPILLARY: Glucose-Capillary: 168 mg/dL — ABNORMAL HIGH (ref 65–99)

## 2017-03-09 LAB — TROPONIN I: Troponin I: <0.03 ng/mL (ref <0.03)

## 2017-03-09 MED ORDER — INSULIN ASPART 100 UNIT/ML ~~LOC~~ SOLN
0.0000 [IU] | Freq: Three times a day (TID) | SUBCUTANEOUS | Status: DC
  Administered 2017-03-10: 11 [IU] via SUBCUTANEOUS
  Administered 2017-03-11 – 2017-03-12 (×2): 3 [IU] via SUBCUTANEOUS

## 2017-03-09 MED ORDER — ENOXAPARIN SODIUM 40 MG/0.4ML ~~LOC~~ SOLN
40.0000 mg | SUBCUTANEOUS | Status: DC
  Administered 2017-03-09 – 2017-03-11 (×3): 40 mg via SUBCUTANEOUS
  Filled 2017-03-09 (×3): qty 0.4

## 2017-03-09 MED ORDER — NITROGLYCERIN 0.4 MG SL SUBL
0.4000 mg | SUBLINGUAL_TABLET | SUBLINGUAL | Status: DC | PRN
  Administered 2017-03-10: 0.4 mg via SUBLINGUAL
  Filled 2017-03-09: qty 1

## 2017-03-09 MED ORDER — BUPROPION HCL ER (XL) 300 MG PO TB24
300.0000 mg | ORAL_TABLET | Freq: Every day | ORAL | Status: DC
  Administered 2017-03-10 – 2017-03-12 (×3): 300 mg via ORAL
  Filled 2017-03-09 (×3): qty 1

## 2017-03-09 MED ORDER — ONDANSETRON HCL 4 MG/2ML IJ SOLN
4.0000 mg | Freq: Four times a day (QID) | INTRAMUSCULAR | Status: DC | PRN
  Administered 2017-03-09 – 2017-03-10 (×3): 4 mg via INTRAVENOUS
  Filled 2017-03-09 (×3): qty 2

## 2017-03-09 MED ORDER — INSULIN GLARGINE 300 UNIT/ML ~~LOC~~ SOPN: 45.0000 [IU] | PEN_INJECTOR | Freq: Every day | SUBCUTANEOUS | Status: DC

## 2017-03-09 MED ORDER — FENOFIBRATE 160 MG PO TABS
160.0000 mg | ORAL_TABLET | Freq: Every day | ORAL | Status: DC
  Administered 2017-03-10 – 2017-03-12 (×3): 160 mg via ORAL
  Filled 2017-03-09 (×3): qty 1

## 2017-03-09 MED ORDER — PAROXETINE MESYLATE 7.5 MG PO CAPS: 7.5000 mg | ORAL_CAPSULE | Freq: Every day | ORAL | Status: DC

## 2017-03-09 MED ORDER — CARVEDILOL 25 MG PO TABS
25.0000 mg | ORAL_TABLET | Freq: Two times a day (BID) | ORAL | Status: DC
  Administered 2017-03-09 – 2017-03-12 (×6): 25 mg via ORAL
  Filled 2017-03-09 (×6): qty 1

## 2017-03-09 MED ORDER — SODIUM CHLORIDE 0.9 % IV SOLN
250.0000 mL | INTRAVENOUS | Status: DC | PRN
Start: 2017-03-09 — End: 2017-03-12

## 2017-03-09 MED ORDER — DILTIAZEM HCL ER COATED BEADS 120 MG PO CP24
120.0000 mg | ORAL_CAPSULE | Freq: Every day | ORAL | Status: DC
  Administered 2017-03-10 – 2017-03-12 (×3): 120 mg via ORAL
  Filled 2017-03-09 (×3): qty 1

## 2017-03-09 MED ORDER — PANTOPRAZOLE SODIUM 40 MG PO TBEC
40.0000 mg | DELAYED_RELEASE_TABLET | Freq: Every day | ORAL | Status: DC
  Administered 2017-03-10 – 2017-03-12 (×3): 40 mg via ORAL
  Filled 2017-03-09 (×3): qty 1

## 2017-03-09 MED ORDER — INSULIN ASPART 100 UNIT/ML ~~LOC~~ SOLN
0.0000 [IU] | Freq: Every day | SUBCUTANEOUS | Status: DC
  Administered 2017-03-10: 4 [IU] via SUBCUTANEOUS

## 2017-03-09 MED ORDER — MORPHINE SULFATE (PF) 4 MG/ML IV SOLN
4.0000 mg | Freq: Once | INTRAVENOUS | Status: AC
  Administered 2017-03-09: 4 mg via INTRAVENOUS
  Filled 2017-03-09: qty 1

## 2017-03-09 MED ORDER — PREDNISOLONE 5 MG PO TABS
5.0000 mg | ORAL_TABLET | Freq: Every day | ORAL | Status: DC
  Administered 2017-03-10 – 2017-03-12 (×3): 5 mg via ORAL
  Filled 2017-03-09 (×3): qty 1

## 2017-03-09 MED ORDER — FLUTICASONE PROPIONATE 50 MCG/ACT NA SUSP
2.0000 | Freq: Every day | NASAL | Status: DC
  Administered 2017-03-10 – 2017-03-11 (×2): 2 via NASAL
  Filled 2017-03-09: qty 16

## 2017-03-09 MED ORDER — LOSARTAN POTASSIUM 50 MG PO TABS
50.0000 mg | ORAL_TABLET | Freq: Every day | ORAL | Status: DC
  Administered 2017-03-09 – 2017-03-12 (×4): 50 mg via ORAL
  Filled 2017-03-09 (×4): qty 1

## 2017-03-09 MED ORDER — MOMETASONE FURO-FORMOTEROL FUM 200-5 MCG/ACT IN AERO
2.0000 | INHALATION_SPRAY | Freq: Two times a day (BID) | RESPIRATORY_TRACT | Status: DC
  Administered 2017-03-09 – 2017-03-12 (×2): 2 via RESPIRATORY_TRACT
  Filled 2017-03-09 (×2): qty 8.8

## 2017-03-09 MED ORDER — SODIUM CHLORIDE 0.9% FLUSH: 3.0000 mL | INTRAVENOUS | Status: DC | PRN

## 2017-03-09 MED ORDER — ASPIRIN EC 81 MG PO TBEC
81.0000 mg | DELAYED_RELEASE_TABLET | Freq: Every day | ORAL | Status: DC
  Administered 2017-03-10 – 2017-03-12 (×3): 81 mg via ORAL
  Filled 2017-03-09 (×3): qty 1

## 2017-03-09 MED ORDER — GABAPENTIN 100 MG PO CAPS
100.0000 mg | ORAL_CAPSULE | Freq: Three times a day (TID) | ORAL | Status: DC
  Administered 2017-03-09 – 2017-03-12 (×7): 100 mg via ORAL
  Filled 2017-03-09 (×7): qty 1

## 2017-03-09 MED ORDER — ALBUTEROL SULFATE HFA 108 (90 BASE) MCG/ACT IN AERS: 1.0000 | INHALATION_SPRAY | Freq: Four times a day (QID) | RESPIRATORY_TRACT | Status: DC | PRN

## 2017-03-09 MED ORDER — ASPIRIN 81 MG PO CHEW
324.0000 mg | CHEWABLE_TABLET | Freq: Once | ORAL | Status: AC
  Administered 2017-03-09: 324 mg via ORAL
  Filled 2017-03-09: qty 4

## 2017-03-09 MED ORDER — FUROSEMIDE 40 MG PO TABS
40.0000 mg | ORAL_TABLET | Freq: Two times a day (BID) | ORAL | Status: DC
  Administered 2017-03-10: 40 mg via ORAL
  Filled 2017-03-09: qty 1

## 2017-03-09 MED ORDER — ACETAMINOPHEN 325 MG PO TABS: 650.0000 mg | ORAL_TABLET | ORAL | Status: DC | PRN

## 2017-03-09 MED ORDER — SODIUM CHLORIDE 0.9% FLUSH
3.0000 mL | Freq: Two times a day (BID) | INTRAVENOUS | Status: DC
  Administered 2017-03-09 – 2017-03-11 (×3): 3 mL via INTRAVENOUS

## 2017-03-09 MED ORDER — ALBUTEROL SULFATE (2.5 MG/3ML) 0.083% IN NEBU: 2.5000 mg | INHALATION_SOLUTION | Freq: Four times a day (QID) | RESPIRATORY_TRACT | Status: DC | PRN

## 2017-03-09 MED ORDER — ATORVASTATIN CALCIUM 40 MG PO TABS
40.0000 mg | ORAL_TABLET | Freq: Every day | ORAL | Status: DC
  Administered 2017-03-09 – 2017-03-10 (×2): 40 mg via ORAL
  Filled 2017-03-09 (×3): qty 1

## 2017-03-09 MED ORDER — ALLOPURINOL 100 MG PO TABS
100.0000 mg | ORAL_TABLET | Freq: Every day | ORAL | Status: DC
  Administered 2017-03-09 – 2017-03-11 (×3): 100 mg via ORAL
  Filled 2017-03-09 (×3): qty 1

## 2017-03-09 MED ORDER — TRAMADOL HCL 50 MG PO TABS
50.0000 mg | ORAL_TABLET | Freq: Two times a day (BID) | ORAL | Status: DC | PRN
  Administered 2017-03-09 – 2017-03-11 (×3): 50 mg via ORAL
  Filled 2017-03-09 (×3): qty 1

## 2017-03-09 MED ORDER — NITROGLYCERIN 0.4 MG SL SUBL
0.4000 mg | SUBLINGUAL_TABLET | SUBLINGUAL | Status: DC | PRN
  Administered 2017-03-09 (×3): 0.4 mg via SUBLINGUAL
  Filled 2017-03-09: qty 1

## 2017-03-09 MED ORDER — INSULIN GLARGINE 100 UNIT/ML ~~LOC~~ SOLN
45.0000 [IU] | Freq: Every day | SUBCUTANEOUS | Status: DC
  Administered 2017-03-09 – 2017-03-11 (×3): 45 [IU] via SUBCUTANEOUS
  Filled 2017-03-09 (×3): qty 0.45

## 2017-03-09 NOTE — ED Provider Notes (Signed)
Pt seen and examined. D/W Resident. Pt c/o Chest tightness radiating to LUE and back. Prior angiogram, but no stenting. Multiple complaints. EKG without acute changes. Improving with NTG and MS. Agree with admit for rule out.   Tanna Furry, MD 03/09/17 [phone]

## 2017-03-09 NOTE — Progress Notes (Signed)
Patient arrived from ED with chest pain. Denies pain. Oriented to room and surroundings.Telemetry applied and CCMD notified.

## 2017-03-09 NOTE — ED Provider Notes (Signed)
[name] Provider Note   CSN: [id number] Arrival date & time: 03/09/17  1547     History   Chief Complaint Chief Complaint  Patient presents with  . Chest Pain    HPI BETTYLEE FEIG is a 56 y.o. female.  The history is provided by the patient.  Chest Pain   This is a new problem. The current episode started yesterday. The problem occurs daily. The problem has been gradually worsening. The pain is associated with rest. The pain is present in the substernal region. The pain is at a severity of 7/10. The pain is moderate. The quality of the pain is described as pressure-like. The pain radiates to the left arm. Associated symptoms include diaphoresis, exertional chest pressure and lower extremity edema. Pertinent negatives include no abdominal pain, no back pain, no claudication, no cough, no dizziness, no fever, no headaches, no hemoptysis, no irregular heartbeat, no leg pain, no malaise/fatigue, no nausea, no near-syncope, no numbness, no orthopnea, no palpitations, no shortness of breath, no sputum production, no syncope, no vomiting and no weakness. She has tried nothing for the symptoms. The treatment provided no relief. Risk factors include obesity.  Her past medical history is significant for diabetes, hyperlipidemia and hypertension.    Past Medical History:  Diagnosis Date  . Abnormal SPEP 04/17/2014  . Acute left ankle pain 01/26/2017  . ANEMIA-UNSPECIFIED 09/18/2009  . CHF (congestive heart failure) ([place])   . Chronic diastolic heart failure, NYHA class 2 (HCC)    LVEDP roughly 20% by cath  . COPD (chronic obstructive pulmonary disease) ([place])   . Depression   . DIABETES MELLITUS, TYPE II 08/21/2006  . Diabetic osteomyelitis ([place]) 05/29/2015  . Exertional chest pain    sharp, substernal, exertional  . Fracture of 5th metatarsal    non union  . GERD 08/21/2006  . GOUT 08/20/2010  . Hx of umbilical hernia repair   . HYPERLIPIDEMIA 08/21/2006  . HYPERTENSION  08/21/2006  . Infection of wound due to methicillin resistant Staphylococcus aureus (MRSA)   . Internal hemorrhoids   . Multiple allergies 10/14/2016  . Multiple allergies 10/14/2016  . OBESITY 06/04/2009  . Onychomycosis 10/27/2015  . Osteomyelitis of left foot ([place]) 05/29/2015  . Pulmonary sarcoidosis ([place])    Followed locally by pulmonology, but also by Dr. Casper Harrison at [facility]  . Right knee pain 01/26/2017  . Vocal cord dysfunction   . Wears partial dentures     Patient Active Problem List   Diagnosis Date Noted  . Acute left ankle pain 01/26/2017  . Osteoarthritis of right knee 01/26/2017  . Multiple allergies 10/14/2016  . Onychomycosis 10/27/2015  . Osteomyelitis of left foot ([place]) 05/29/2015  . MRSA (methicillin resistant staph aureus) culture positive 03/27/2015  . Wound infection complicating hardware ([place]) 03/27/2015  . Fatty liver 09/30/2014  . Hot flashes 07/15/2014  . Abnormal SPEP 04/17/2014  . Fracture of left leg 04/17/2014  . Cushingoid side effect of steroids ([place]) 04/17/2014  . Internal hemorrhoids   . Depression   . Preoperative clearance 03/25/2014  . Obstructive chronic bronchitis without exacerbation ([place]) 09/18/2013  . Chest pain - at rest, and with exertion; -- angiographically normal coronary arteries, mildly elevated LVEDP 04/11/2013  . Hypertensive heart disease with chronic diastolic congestive heart failure ([name])   . Solitary pulmonary nodule, on CT 02/2013 - stable over 2 years in 2015 02/20/2013  . Gout 08/20/2010  . Anemia 09/18/2009  . Morbid obesity ([place]) 06/04/2009  . Sleep  apnea 04/21/2009  . Sarcoidosis of lung ([place]) 04/10/2007  . Hypertriglyceridemia 08/21/2006  . Essential hypertension 08/21/2006  . GERD 08/21/2006  . Type II diabetes mellitus with neurological manifestations, uncontrolled ([place]) 08/21/2006    Past Surgical History:  Procedure Laterality Date  . ABDOMINAL HYSTERECTOMY    . APPENDECTOMY    . BLADDER  SUSPENSION  11/11/2011   Procedure: TRANSVAGINAL TAPE (TVT) PROCEDURE;  Surgeon: Olga Millers, MD;  Location: [facility];  Service: Gynecology;  Laterality: N/A;  . CARDIAC CATHETERIZATION  07/2010   LVEF 50-55% WITH VERY MILD GLOBAL HYPOKINESIA; ESSENTIALLY NORMAL CORONARY ARTERIES; NORMAL LV FUNCTION  . CAROTIDS  02/18/11   CAROTID DUPLEX; VERTEBRALS ARE PATENT WITH ANTEGRADE FLOW. ICA/CCA RATIO 1.61 ON RIGHT AND 0.75 ON LEFT  . CHOLECYSTECTOMY  1984  . CYSTOSCOPY  11/11/2011   Procedure: CYSTOSCOPY;  Surgeon: Olga Millers, MD;  Location: [facility];  Service: Gynecology;  Laterality: N/A;  . DOPPLER ECHOCARDIOGRAPHY  02/12/2013   LV FUNCTION, SIZE NORMAL; MILD CONCENTRIC LVH; EST EF 55-65%; WALL MOTION NORMAL  . FRACTURE SURGERY     foot  . HERNIA REPAIR    . I&D EXTREMITY Left 06/27/2015   Procedure: Partial Excision Left Calcaneus, Place Antibiotic Beads, and Wound VAC;  Surgeon: Newt Minion, MD;  Location: [facility];  Service: Orthopedics;  Laterality: Left;  . KNEE ARTHROSCOPY     right  . LEFT AND RIGHT HEART CATHETERIZATION WITH CORONARY ANGIOGRAM N/A 04/23/2013   Procedure: LEFT AND RIGHT HEART CATHETERIZATION WITH CORONARY ANGIOGRAM;  Surgeon: Leonie Man, MD;  Location: [facility];  Service: Cardiovascular;  Laterality: N/A;  . LexiScan Myoview  03/09/2013   EF 50%; NORMAL MYOCARDIAL PERFUSION STUDY - breast attenuation  . METATARSAL OSTEOTOMY WITH OPEN REDUCTION INTERNAL FIXATION (ORIF) METATARSAL WITH FUSION Left 04/09/2014   Procedure: LEFT FOOT FRACTURE OPEN TREATMENT METATARSAL INCLUDES INTERNAL FIXATION EACH;  Surgeon: Lorn Junes, MD;  Location: [facility];  Service: Orthopedics;  Laterality: Left;  . NISSEN FUNDOPLICATION  6073  . Right and left CARDIAC CATHETERIZATION  04/23/2013   Angiographic normal coronaries; LVEDP 20 mmHg, PCWP 12-14 mmHg, RAP 12 mmHg.; Fick CO/CI 4.9/2.2  . TUBAL LIGATION     with reversal in 1994  . VENTRAL HERNIA REPAIR      OB  History    No data available       Home Medications    Prior to Admission medications   Medication Sig Start Date End Date Taking? Authorizing Provider  albuterol (PROVENTIL HFA;VENTOLIN HFA) 108 (90 BASE) MCG/ACT inhaler Inhale 1-2 puffs into the lungs every 6 (six) hours as needed for wheezing or shortness of breath. 09/25/15   Mannam, Hart Robinsons, MD  albuterol (PROVENTIL) (2.5 MG/3ML) 0.083% nebulizer solution Take 3 mLs (2.5 mg total) by nebulization every 6 (six) hours as needed for wheezing or shortness of breath. 12/05/15   [name], MD  allopurinol (ZYLOPRIM) 100 MG tablet Take 100 mg by mouth at bedtime.     [provider]  aspirin EC 81 MG tablet Take 81 mg by mouth daily.     [provider]  atorvastatin (LIPITOR) 40 MG tablet TAKE 1 TAB BY MOUTH ONCE DAILY 05/10/16   [name], MD  benzonatate (TESSALON) 100 MG capsule Take 100 mg by mouth 3 (three) times daily as needed for cough. Reported on 01/05/2016    [provider]  buPROPion (WELLBUTRIN XL) 300 MG 24 hr tablet TAKE 1  TABLET (300 MG TOTAL) BY MOUTH DAILY. 12/10/16   [name], MD  carvedilol (COREG) 25 MG tablet Take 1 tablet (25 mg total) by mouth 2 (two) times daily with a meal. 12/10/16   Leonie Man, MD  chlorpheniramine-HYDROcodone (TUSSIONEX) 10-8 MG/5ML SUER Take 5 mLs by mouth daily as needed for cough. Reported on 01/05/2016 11/03/15   [provider]  DEXILANT 60 MG capsule TAKE ONE CAPSULE BY MOUTH EVERY DAY 08/31/16   Mannam, Praveen, MD  diclofenac sodium (VOLTAREN) 1 % GEL Apply 2 g topically 4 (four) times daily. 03/02/17   [name], MD  diltiazem (CARDIZEM CD) 120 MG 24 hr capsule Take 1 capsule (120 mg total) by mouth daily. 12/30/16   Leonie Man, MD  estradiol (ESTRACE) 2 MG tablet Take 2 mg by mouth at bedtime.  01/24/14   [provider]  fenofibrate (TRICOR) 145 MG tablet TAKE 1 TABLET (145 MG TOTAL) BY MOUTH DAILY. 12/28/16    Leonie Man, MD  fluticasone [facility]) 50 MCG/ACT nasal spray Place 2 sprays into both nostrils daily. 07/14/16   [name], MD  furosemide (LASIX) 40 MG tablet TAKE 1 TABLET BY MOUTH TWICE A DAY 12/06/16   Leonie Man, MD  gabapentin (NEURONTIN) 100 MG capsule TAKE ONE CAPSULE BY MOUTH 3 TIMES A DAY 11/02/16   Philemon Kingdom, MD  glucose blood (ONE TOUCH ULTRA TEST) test strip 1 each by Other route 3 (three) times daily. Use as instructed 10/19/13   Swords, Darrick Penna, MD  glyBURIDE (DIABETA) 2.5 MG tablet Take 2.5 mg by mouth.    [provider]  insulin aspart (NOVOLOG FLEXPEN) 100 UNIT/ML FlexPen Inject 12-20 Units into the skin 3 (three) times daily with meals. Per sliding scale 01/15/16   Philemon Kingdom, MD  Insulin Glargine (TOUJEO SOLOSTAR) 300 UNIT/ML SOPN Inject 45 Units into the skin daily. 09/29/16   Philemon Kingdom, MD  Insulin Pen Needle 33G X 4 MM MISC 1 each by Does not apply route 4 (four) times daily. 01/15/16   Philemon Kingdom, MD  LORazepam (ATIVAN) 1 MG tablet Take 5 mg by mouth.  10/26/11   [provider]  losartan (COZAAR) 50 MG tablet TAKE 1 TABLET (50 MG TOTAL) BY MOUTH DAILY. 08/07/16   [provider]  metFORMIN (GLUCOPHAGE) 1000 MG tablet TAKE ONE TABLET BY MOUTH TWICE DAILY WITH MEALS 12/21/16   Philemon Kingdom, MD  mometasone-formoterol [facility]) 200-5 MCG/ACT AERO Inhale 2 puffs into the lungs 2 (two) times daily. 09/25/15   Mannam, Hart Robinsons, MD  moxifloxacin (VIGAMOX) 0.5 % ophthalmic solution INSTILL 1 DROP IN RIGHT EYE 4 X DAILY STARTING 2 DAYS PRIOR TO SURGERY& 2 DROPS MORNING OF SURGERY 10/09/16   [provider]  NEOMYCIN-POLYMYXIN-HYDROCORTISONE (CORTISPORIN) 1 % SOLN otic solution Apply 1-2 drops to toe BID after soaking 02/08/17   Hyatt, Max T, DPM  nitroGLYCERIN (NITROSTAT) 0.4 MG SL tablet Place 0.4 mg under the tongue every 5 (five) minutes as needed. Reported on 01/05/2016    [provider]    omeprazole (PRILOSEC) 20 MG capsule Take 20 mg by mouth.    [provider]  ondansetron (ZOFRAN-ODT) 4 MG disintegrating tablet Take 4 mg by mouth every 8 (eight) hours as needed for nausea or vomiting. Reported on 01/05/2016 03/31/15   [provider]  orlistat (XENICAL) 120 MG capsule Take 1 capsule (120 mg total) by mouth 3 (three) times daily with meals. 03/02/17   Garret Reddish  O, MD  PARoxetine Mesylate (BRISDELLE) 7.5 MG CAPS Take 7.5 mg by mouth daily.    [provider]  Polyethyl Glycol-Propyl Glycol (SYSTANE) 0.4-0.3 % SOLN Apply 1 drop to eye at bedtime.    [provider]  Potassium Chloride ER 20 MEQ TBCR TAKE ONE TABLET BY MOUTH THREE TIMES DAILY 01/10/17   [name], MD  PREDNISONE PO Take 5 mg by mouth daily. 20 mg through 10/15/16    [provider]  sulfamethoxazole-trimethoprim (BACTRIM DS,SEPTRA DS) 800-160 MG tablet Take 2 tablets by mouth 2 (two) times daily. 02/08/17   Hyatt, Max T, DPM  traMADol (ULTRAM) 50 MG tablet Take 1 tablet (50 mg total) by mouth every 12 (twelve) hours as needed for moderate pain or severe pain. 03/02/17   [name], MD  venlafaxine XR (EFFEXOR-XR) 75 MG 24 hr capsule Take 2 capsules (150 mg total) by mouth at bedtime. 12/05/15   Hongalgi, Lenis Dickinson, MD  vitamin B-12 (CYANOCOBALAMIN) 1000 MCG tablet Take 1,000 mcg by mouth daily.    [provider]    Family History Family History  Problem Relation Age of Onset  . Diabetes Father   . Heart attack Father   . Coronary artery disease Father   . Heart failure Father   . COPD Mother   . Emphysema Mother   . Asthma Mother   . Heart failure Mother   . Heart attack Maternal Grandfather   . Sarcoidosis Maternal Uncle   . Lung cancer Brother   . Diabetes Brother   . Colon cancer Neg Hx     Social History Social History  Substance Use Topics  . Smoking status: Never Smoker  . Smokeless tobacco: Never Used  . Alcohol use No      Allergies   Methotrexate; Vancomycin; Clindamycin/lincomycin; Chlorhexidine; Doxycycline; Lisinopril; and Teflaro [ceftaroline]   Review of Systems Review of Systems  Constitutional: Positive for diaphoresis. Negative for fever and malaise/fatigue.  Respiratory: Negative for cough, hemoptysis, sputum production and shortness of breath.   Cardiovascular: Positive for chest pain and leg swelling. Negative for palpitations, orthopnea, claudication, syncope and near-syncope.  Gastrointestinal: Negative for abdominal pain, nausea and vomiting.  Musculoskeletal: Negative for back pain.  Neurological: Negative for dizziness, weakness, numbness and headaches.     Physical Exam Updated Vital Signs ED Triage Vitals  Enc Vitals Group     BP 03/09/17 1551 (!) 143/72     Pulse Rate 03/09/17 1551 81     Resp 03/09/17 1551 18     Temp 03/09/17 1551 98 F (36.7 C)     Temp Source 03/09/17 1551 Oral     SpO2 03/09/17 1551 99 %     Weight 03/09/17 1556 250 lb (113.4 kg)     Height 03/09/17 1556 5\' 6"  (1.676 m)     Head Circumference --      Peak Flow --      Pain Score 03/09/17 1556 10     Pain Loc --      Pain Edu? --      Excl. in [place]? --     Physical Exam  Constitutional: She is oriented to person, place, and time. She appears well-developed and well-nourished. She appears distressed.  HENT:  Head: Normocephalic and atraumatic.  Eyes: Conjunctivae are normal. Pupils are equal, round, and reactive to light.  Neck: Normal range of motion. Neck supple.  Cardiovascular: Normal rate, regular rhythm, normal heart sounds and intact distal pulses.  No murmur heard. Pulmonary/Chest: Effort normal and breath sounds normal. No respiratory distress. She has no wheezes. She has no rales.  Abdominal: Soft. Bowel sounds are normal. She exhibits no distension. There is no tenderness.  Musculoskeletal: She exhibits edema (2+ pitting edema).  Neurological: She is alert and oriented to person,  place, and time.  Skin: Skin is warm and dry.  Psychiatric: She has a normal mood and affect.  Nursing note and vitals reviewed.    ED Treatments / Results  Labs (all labs ordered are listed, but only abnormal results are displayed) Labs Reviewed  BASIC METABOLIC PANEL - Abnormal; Notable for the following:       Result Value   Glucose, Bld 201 (*)    BUN 21 (*)    Creatinine, Ser 1.33 (*)    GFR calc non Af Amer 43 (*)    GFR calc Af Amer 50 (*)    All other components within normal limits  CBC - Abnormal; Notable for the following:    WBC 13.9 (*)    RBC 3.72 (*)    Hemoglobin 10.3 (*)    HCT 32.4 (*)    Platelets 438 (*)    All other components within normal limits  BRAIN NATRIURETIC PEPTIDE  I-STAT TROPOININ, ED    EKG  EKG Interpretation  Date/Time:  Wednesday Mar 09 2017 15:54:16 EDT Ventricular Rate:  82 PR Interval:  140 QRS Duration: 76 QT Interval:  382 QTC Calculation: 446 R Axis:   16 Text Interpretation:  Sinus rhythm with occasional Premature ventricular complexes Cannot rule out Anterior infarct , age undetermined Abnormal ECG Confirmed by Tanna Furry [phone]) on 03/09/2017 5:53:55 PM       Radiology Dg Chest 2 View  Result Date: 03/09/2017 CLINICAL DATA:  Central left-sided chest pain with left arm numbness, tingling, and shortness of breath since last night. Nausea. History of sarcoidosis. EXAM: CHEST  2 VIEW COMPARISON:  12/03/2015 FINDINGS: The cardiac silhouette appears slightly enlarged. No airspace consolidation, edema, pleural effusion, or pneumothorax is identified. Mild chronic bronchitic changes are similar to the prior study. Upper abdominal surgical clips are noted. No acute osseous abnormality is seen. IMPRESSION: No active cardiopulmonary disease. Electronically Signed   By: Logan Bores M.D.   On: 03/09/2017 16:31    Procedures Procedures (including critical care time)  Medications Ordered in ED Medications  nitroGLYCERIN  (NITROSTAT) SL tablet 0.4 mg (0.4 mg Sublingual Given 03/09/17 1804)  aspirin chewable tablet 324 mg (324 mg Oral Given 03/09/17 1754)  morphine 4 MG/ML injection 4 mg (4 mg Intravenous Given 03/09/17 1810)     Initial Impression / Assessment and Plan / ED Course  I have reviewed the triage vital signs and the nursing notes.  Pertinent labs & imaging results that were available during my care of the patient were reviewed by me and considered in my medical decision making (see chart for details).     QUINTASIA THEROUX is a 56 year old female with history of high cholesterol, sarcoidosis, heart failure, hypertension who presents to the ED with chest pain. Patient's vitals at time of arrival to the ED are unremarkable and patient is without fever. Patient states chest pain started last night that was intermittent and continued this morning and became more constant. Patient states she felt sweaty, nauseous and pain radiated down the left arm. Patient with EKG that shows normal sinus rhythm with no signs of acute ischemic changes. Patient with hear score of 5 putting her  at high risk for acute coronary syndrome and will get troponin, CBC, BMP, chest x-ray and will likely need admission for further cardiac workup. Patient has Wells criteria 0 and doubt PE. Patient does have edema in her lower extremities but clear breath sounds bilaterally and doubt volume overload/heart failure. Patient has normal pulses throughout and equal blood pressure in both upper extremities and doubt dissection. Chest x-ray also showed no signs of widened mediastinum. X-ray also showed no pneumonia, no pulmonary edema, no effusions. Initial troponin was within normal limits. There was no significant electrolyte abnormality or anemia. Patient was given 3 sublingual nitrogen and IV morphine and pain improved. Patient admitted to medicine service for further ACS rule out.   Patient was hemodynamically stable at time of transfer of  care.  Final Clinical Impressions(s) / ED Diagnoses   Final diagnoses:  Chest pain, unspecified type    New Prescriptions New Prescriptions   No medications on file     Lennice Sites, DO 03/09/17 1833    Tanna Furry, MD 03/10/17 1502    Tanna Furry, MD 03/10/17 [phone]

## 2017-03-09 NOTE — H&P (Signed)
History and Physical    [name] FKC:[id number] DOB: 14-Sep-1961 DOA: 03/09/2017  PCP: [name], MD Consultants:  Ellyn Hack - cardiology; [facility] - ID; Mannam - pulmonology; Casper Harrison - sarcoidosis at [facility] Patient coming from: home - lives with husband; NOK: husband, [phone]  Chief Complaint: chest pain  HPI: [name] is a 56 y.o. female with medical history significant of gastric bypass; pulmonary sarcoidosis; HTN; HLD; MRSA wound infection; DM; COPD; and chronic diastolic heart failure (Echo 11/15 showed preserved EF and grade 1 diastolic dysfunction) presenting with chest pain.  Last night she developed substernal chest pain into the back.  Not bad last night but worse today with sweating, radiation down left arm, tingling in fingers, N/V.  Symptoms started worsening about 2pm.  Worse with exertion.  Better with rest and NTG.  H/o catheterization very remotely, last stress test was in 2014.   ED Course: Heart score 5, Wells score 0.  Admit for cardiac r/o.  Review of Systems: As per HPI; otherwise review of systems reviewed and negative.   Ambulatory Status:  Ambulates without assistance  Past Medical History:  Diagnosis Date  . Abnormal SPEP 04/17/2014  . Acute left ankle pain 01/26/2017  . ANEMIA-UNSPECIFIED 09/18/2009  . Chronic diastolic heart failure, NYHA class 2 (HCC)    LVEDP roughly 20% by cath  . COPD (chronic obstructive pulmonary disease) ([place])   . Depression   . DIABETES MELLITUS, TYPE II 08/21/2006  . Diabetic osteomyelitis ([place]) 05/29/2015  . Fracture of 5th metatarsal    non union  . GERD 08/21/2006  . GOUT 08/20/2010  . Hx of umbilical hernia repair   . HYPERLIPIDEMIA 08/21/2006  . HYPERTENSION 08/21/2006  . Infection of wound due to methicillin resistant Staphylococcus aureus (MRSA)   . Internal hemorrhoids   . Multiple allergies 10/14/2016  . OBESITY 06/04/2009  . Onychomycosis 10/27/2015  . Osteomyelitis of left foot ([place]) 05/29/2015  .  Pulmonary sarcoidosis ([place])    Followed locally by pulmonology, but also by Dr. Casper Harrison at [facility]  . Right knee pain 01/26/2017  . Vocal cord dysfunction   . Wears partial dentures     Past Surgical History:  Procedure Laterality Date  . ABDOMINAL HYSTERECTOMY    . APPENDECTOMY    . BLADDER SUSPENSION  11/11/2011   Procedure: TRANSVAGINAL TAPE (TVT) PROCEDURE;  Surgeon: Olga Millers, MD;  Location: [facility];  Service: Gynecology;  Laterality: N/A;  . CARDIAC CATHETERIZATION  07/2010   LVEF 50-55% WITH VERY MILD GLOBAL HYPOKINESIA; ESSENTIALLY NORMAL CORONARY ARTERIES; NORMAL LV FUNCTION  . CAROTIDS  02/18/11   CAROTID DUPLEX; VERTEBRALS ARE PATENT WITH ANTEGRADE FLOW. ICA/CCA RATIO 1.61 ON RIGHT AND 0.75 ON LEFT  . CHOLECYSTECTOMY  1984  . CYSTOSCOPY  11/11/2011   Procedure: CYSTOSCOPY;  Surgeon: Olga Millers, MD;  Location: [facility];  Service: Gynecology;  Laterality: N/A;  . DOPPLER ECHOCARDIOGRAPHY  02/12/2013   LV FUNCTION, SIZE NORMAL; MILD CONCENTRIC LVH; EST EF 55-65%; WALL MOTION NORMAL  . FRACTURE SURGERY     foot  . HERNIA REPAIR    . I&D EXTREMITY Left 06/27/2015   Procedure: Partial Excision Left Calcaneus, Place Antibiotic Beads, and Wound VAC;  Surgeon: Newt Minion, MD;  Location: [facility];  Service: Orthopedics;  Laterality: Left;  . KNEE ARTHROSCOPY     right  . LEFT AND RIGHT HEART CATHETERIZATION WITH CORONARY ANGIOGRAM N/A 04/23/2013   Procedure: LEFT AND RIGHT HEART CATHETERIZATION WITH CORONARY  Cyril Loosen;  Surgeon: Leonie Man, MD;  Location: [facility];  Service: Cardiovascular;  Laterality: N/A;  . LexiScan Myoview  03/09/2013   EF 50%; NORMAL MYOCARDIAL PERFUSION STUDY - breast attenuation  . METATARSAL OSTEOTOMY WITH OPEN REDUCTION INTERNAL FIXATION (ORIF) METATARSAL WITH FUSION Left 04/09/2014   Procedure: LEFT FOOT FRACTURE OPEN TREATMENT METATARSAL INCLUDES INTERNAL FIXATION EACH;  Surgeon: Lorn Junes, MD;  Location: [place];  Service: Orthopedics;  Laterality: Left;  . NISSEN FUNDOPLICATION  7035  . Right and left CARDIAC CATHETERIZATION  04/23/2013   Angiographic normal coronaries; LVEDP 20 mmHg, PCWP 12-14 mmHg, RAP 12 mmHg.; Fick CO/CI 4.9/2.2  . TUBAL LIGATION     with reversal in 1994  . VENTRAL HERNIA REPAIR      Social History   Social History  . Marital status: Married    Spouse name: THEOLA CUELLAR  . Number of children: 2  . Years of education: 12   Occupational History  . DISABLED    Social History Main Topics  . Smoking status: Never Smoker  . Smokeless tobacco: Never Used  . Alcohol use No  . Drug use: No  . Sexual activity: Yes    Birth control/ protection: Surgical   Other Topics Concern  . Not on file   Social History Narrative   Married 1994. 2 sons who both live close and 1 grandson.       Disability due to sarcoidosis. 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 Methotrexate Other (See Comments)    Peri-oral and buccal lesions  . Vancomycin Other (See Comments)    Nephrotoxicity   . Clindamycin/Lincomycin Nausea And Vomiting and Rash  . Chlorhexidine Itching  . Doxycycline Rash  . Lisinopril Cough  . Teflaro [Ceftaroline] Rash    Family History  Problem Relation Age of Onset  . Diabetes Father   . Heart attack Father 61  . Coronary artery disease Father   . Heart failure Father   . COPD Mother   . Emphysema Mother   . Asthma Mother   . Heart failure Mother   . Heart attack Maternal Grandfather   . Sarcoidosis Maternal Uncle   . Lung cancer Brother   . Diabetes Brother   . Colon cancer Neg Hx     Prior to Admission medications   Medication Sig Start Date End Date Taking? Authorizing Provider  albuterol (PROVENTIL HFA;VENTOLIN HFA) 108 (90 BASE) MCG/ACT inhaler Inhale  1-2 puffs into the lungs every 6 (six) hours as needed for wheezing or shortness of breath. 09/25/15   Mannam, Hart Robinsons, MD  albuterol (PROVENTIL) (2.5 MG/3ML) 0.083% nebulizer solution Take 3 mLs (2.5 mg total) by nebulization every 6 (six) hours as needed for wheezing or shortness of breath. 12/05/15   [name], MD  allopurinol (ZYLOPRIM) 100 MG tablet Take 100 mg by mouth at bedtime.     [provider]  aspirin EC 81 MG tablet Take 81 mg by mouth daily.     [provider]  atorvastatin (LIPITOR) 40 MG tablet TAKE 1 TAB BY MOUTH ONCE DAILY 05/10/16   [name], MD  benzonatate (TESSALON) 100 MG capsule Take 100 mg by mouth 3 (three) times daily as needed for cough. Reported on 01/05/2016  [provider]  buPROPion (WELLBUTRIN XL) 300 MG 24 hr tablet TAKE 1 TABLET (300 MG TOTAL) BY MOUTH DAILY. 12/10/16   [name], MD  carvedilol (COREG) 25 MG tablet Take 1 tablet (25 mg total) by mouth 2 (two) times daily with a meal. 12/10/16   Leonie Man, MD  chlorpheniramine-HYDROcodone (TUSSIONEX) 10-8 MG/5ML SUER Take 5 mLs by mouth daily as needed for cough. Reported on 01/05/2016 11/03/15   [provider]  DEXILANT 60 MG capsule TAKE ONE CAPSULE BY MOUTH EVERY DAY 08/31/16   Mannam, Praveen, MD  diclofenac sodium (VOLTAREN) 1 % GEL Apply 2 g topically 4 (four) times daily. 03/02/17   [name], MD  diltiazem (CARDIZEM CD) 120 MG 24 hr capsule Take 1 capsule (120 mg total) by mouth daily. 12/30/16   Leonie Man, MD  estradiol (ESTRACE) 2 MG tablet Take 2 mg by mouth at bedtime.  01/24/14   [provider]  fenofibrate (TRICOR) 145 MG tablet TAKE 1 TABLET (145 MG TOTAL) BY MOUTH DAILY. 12/28/16   Leonie Man, MD  fluticasone [facility]) 50 MCG/ACT nasal spray Place 2 sprays into both nostrils daily. 07/14/16   [name], MD  furosemide (LASIX) 40 MG tablet TAKE 1 TABLET BY MOUTH TWICE A DAY 12/06/16   Leonie Man, MD   gabapentin (NEURONTIN) 100 MG capsule TAKE ONE CAPSULE BY MOUTH 3 TIMES A DAY 11/02/16   Philemon Kingdom, MD  glucose blood (ONE TOUCH ULTRA TEST) test strip 1 each by Other route 3 (three) times daily. Use as instructed 10/19/13   Swords, Darrick Penna, MD  glyBURIDE (DIABETA) 2.5 MG tablet Take 2.5 mg by mouth.    [provider]  insulin aspart (NOVOLOG FLEXPEN) 100 UNIT/ML FlexPen Inject 12-20 Units into the skin 3 (three) times daily with meals. Per sliding scale 01/15/16   Philemon Kingdom, MD  Insulin Glargine (TOUJEO SOLOSTAR) 300 UNIT/ML SOPN Inject 45 Units into the skin daily. 09/29/16   Philemon Kingdom, MD  Insulin Pen Needle 33G X 4 MM MISC 1 each by Does not apply route 4 (four) times daily. 01/15/16   Philemon Kingdom, MD  LORazepam (ATIVAN) 1 MG tablet Take 5 mg by mouth.  10/26/11   [provider]  losartan (COZAAR) 50 MG tablet TAKE 1 TABLET (50 MG TOTAL) BY MOUTH DAILY. 08/07/16   [provider]  metFORMIN (GLUCOPHAGE) 1000 MG tablet TAKE ONE TABLET BY MOUTH TWICE DAILY WITH MEALS 12/21/16   Philemon Kingdom, MD  mometasone-formoterol [facility]) 200-5 MCG/ACT AERO Inhale 2 puffs into the lungs 2 (two) times daily. 09/25/15   Mannam, Hart Robinsons, MD  moxifloxacin (VIGAMOX) 0.5 % ophthalmic solution INSTILL 1 DROP IN RIGHT EYE 4 X DAILY STARTING 2 DAYS PRIOR TO SURGERY& 2 DROPS MORNING OF SURGERY 10/09/16   [provider]  NEOMYCIN-POLYMYXIN-HYDROCORTISONE (CORTISPORIN) 1 % SOLN otic solution Apply 1-2 drops to toe BID after soaking 02/08/17   Hyatt, Max T, DPM  nitroGLYCERIN (NITROSTAT) 0.4 MG SL tablet Place 0.4 mg under the tongue every 5 (five) minutes as needed. Reported on 01/05/2016    [provider]  omeprazole (PRILOSEC) 20 MG capsule Take 20 mg by mouth.    [provider]  ondansetron (ZOFRAN-ODT) 4 MG disintegrating tablet Take 4 mg by mouth every 8 (eight) hours as needed for nausea or vomiting. Reported on 01/05/2016 03/31/15    [provider]  orlistat (XENICAL) 120 MG capsule Take 1 capsule (120 mg total)  by mouth 3 (three) times daily with meals. 03/02/17   [name], MD  PARoxetine Mesylate (BRISDELLE) 7.5 MG CAPS Take 7.5 mg by mouth daily.    [provider]  Polyethyl Glycol-Propyl Glycol (SYSTANE) 0.4-0.3 % SOLN Apply 1 drop to eye at bedtime.    [provider]  Potassium Chloride ER 20 MEQ TBCR TAKE ONE TABLET BY MOUTH THREE TIMES DAILY 01/10/17   [name], MD  PREDNISONE PO Take 5 mg by mouth daily. 20 mg through 10/15/16    [provider]  sulfamethoxazole-trimethoprim (BACTRIM DS,SEPTRA DS) 800-160 MG tablet Take 2 tablets by mouth 2 (two) times daily. 02/08/17   Hyatt, Max T, DPM  traMADol (ULTRAM) 50 MG tablet Take 1 tablet (50 mg total) by mouth every 12 (twelve) hours as needed for moderate pain or severe pain. 03/02/17   [name], MD  venlafaxine XR (EFFEXOR-XR) 75 MG 24 hr capsule Take 2 capsules (150 mg total) by mouth at bedtime. 12/05/15   Hongalgi, Lenis Dickinson, MD  vitamin B-12 (CYANOCOBALAMIN) 1000 MCG tablet Take 1,000 mcg by mouth daily.    [provider]    Physical Exam: Vitals:   03/09/17 1915 03/09/17 1930 03/09/17 1945 03/09/17 2015  BP: 138/67 130/69 138/66 130/63  Pulse: 77 77 77 79  Resp: 18 (!) 22 19 18   Temp:    98.2 F (36.8 C)  TempSrc:    Oral  SpO2: 97% 98% 97% 99%  Weight:    113.9 kg (251 lb)  Height:    5\' 6"  (1.676 m)     General:  Appears calm and comfortable and is NAD Eyes:  PERRL, EOMI, normal lids, iris ENT:  grossly normal hearing, lips & tongue, mmm Neck:  no LAD, masses or thyromegaly Cardiovascular:  RRR, no m/r/g. No LE edema.  Respiratory:  CTA bilaterally, no w/r/r. Normal respiratory effort. Abdomen:  soft, ntnd, NABS Skin:  no rash or induration seen on limited exam Musculoskeletal:  grossly normal tone BUE/BLE, good ROM, no bony abnormality Psychiatric:  grossly normal mood and affect,  speech fluent and appropriate, AOx3 Neurologic:  CN 2-12 grossly intact, moves all extremities in coordinated fashion, sensation intact  Labs on Admission: I have personally reviewed following labs and imaging studies  CBC:  Recent Labs Lab 03/09/17 1556  WBC 13.9*  HGB 10.3*  HCT 32.4*  MCV 87.1  PLT 767*   Basic Metabolic Panel:  Recent Labs Lab 03/09/17 1556  NA 137  K 4.5  CL 101  CO2 27  GLUCOSE 201*  BUN 21*  CREATININE 1.33*  CALCIUM 9.5   GFR: Estimated Creatinine Clearance: 59.7 mL/min (A) (by C-G formula based on SCr of 1.33 mg/dL (H)). Liver Function Tests: No results for input(s): AST, ALT, ALKPHOS, BILITOT, PROT, ALBUMIN in the last 168 hours. No results for input(s): LIPASE, AMYLASE in the last 168 hours. No results for input(s): AMMONIA in the last 168 hours. Coagulation Profile: No results for input(s): INR, PROTIME in the last 168 hours. Cardiac Enzymes: No results for input(s): CKTOTAL, CKMB, CKMBINDEX, TROPONINI in the last 168 hours. BNP (last 3 results) No results for input(s): PROBNP in the last 8760 hours. HbA1C: No results for input(s): HGBA1C in the last 72 hours. CBG:  Recent Labs Lab 03/09/17 2019  GLUCAP 168*   Lipid Profile: No results for input(s): CHOL, HDL, LDLCALC, TRIG, CHOLHDL, LDLDIRECT in the last 72 hours. Thyroid Function Tests: No results for input(s): TSH, T4TOTAL, FREET4,  T3FREE, THYROIDAB in the last 72 hours. Anemia Panel: No results for input(s): VITAMINB12, FOLATE, FERRITIN, TIBC, IRON, RETICCTPCT in the last 72 hours. Urine analysis:    Component Value Date/Time   COLORURINE AMBER (A) 10/04/2015 1444   APPEARANCEUR CLEAR 10/04/2015 1444   LABSPEC 1.021 10/04/2015 1444   PHURINE 5.0 10/04/2015 1444   GLUCOSEU NEGATIVE 10/04/2015 1444   HGBUR NEGATIVE 10/04/2015 1444   HGBUR negative 10/20/2010 0814   BILIRUBINUR n 11/12/2015 1535   KETONESUR NEGATIVE 10/04/2015 1444   PROTEINUR n 11/12/2015 1535    PROTEINUR NEGATIVE 10/04/2015 1444   UROBILINOGEN 0.2 11/12/2015 1535   UROBILINOGEN 0.2 04/27/2015 1226   NITRITE n 11/12/2015 1535   NITRITE NEGATIVE 10/04/2015 1444   LEUKOCYTESUR small (1+) (A) 11/12/2015 1535    Creatinine Clearance: Estimated Creatinine Clearance: 59.7 mL/min (A) (by C-G formula based on SCr of 1.33 mg/dL (H)).  Sepsis Labs: @LABRCNTIP (procalcitonin:4,lacticidven:4) )No results found for this or any previous visit (from the past 240 hour(s)).   Radiological Exams on Admission: Dg Chest 2 View  Result Date: 03/09/2017 CLINICAL DATA:  Central left-sided chest pain with left arm numbness, tingling, and shortness of breath since last night. Nausea. History of sarcoidosis. EXAM: CHEST  2 VIEW COMPARISON:  12/03/2015 FINDINGS: The cardiac silhouette appears slightly enlarged. No airspace consolidation, edema, pleural effusion, or pneumothorax is identified. Mild chronic bronchitic changes are similar to the prior study. Upper abdominal surgical clips are noted. No acute osseous abnormality is seen. IMPRESSION: No active cardiopulmonary disease. Electronically Signed   By: Logan Bores M.D.   On: 03/09/2017 16:31    EKG: Independently reviewed.  NSR with rate 84; PVC, low voltage, no evidence of acute ischemia  Assessment/Plan Principal Problem:   Chest pain Active Problems:   Sarcoidosis of lung (HCC)   Hyperlipidemia   Essential hypertension   Type II diabetes mellitus with neurological manifestations, uncontrolled (HCC)   Anemia   Depression   CKD (chronic kidney disease) stage 3, GFR 30-59 ml/min   Chest pain -Patient with substernal chest pressure that has intensified in the last 24 hours, appears to be exertional and improved with rest/NTG, concerning for [place] -Currently chest pain-free. -CXR unremarkable.   -Initial cardiac troponin negative.  -BNP 47.6 -EKG not indicative of acute ischemia.   -GRACE score is 94; which predicts an in-hospital death rate  of 0.7%.  -Will plan to place in observation status on telemetry to rule out ACS by overnight observation.  -cycle troponin q6h x 3 and repeat EKG in AM -Continue ASA 81 mg daily -morphine given -Risk factor stratification with HgbA1c and FLP; will also check TSH and UDS -Cardiology consultation in AM - NPO for possible stress test; message sent through [place] -Will plan to start Heparin drip if enzymes are positive and/or chest pain recurs -Hold Voltaren, Orlistat, and Estradiol  HTN -Takes Coreg, Cardizem, Cozaar at home -Patient with appropriate control while in the ER  HLD -Continue Lipitor and Tricor -Check lipids  DM -Glucose 201, 168 -Uses Toujeo, will continue -Hold Glucophage -Last A1c was 8.7 in 12/17, indicating suboptimal control -Will cover with moderate scale SSI for now  Depression -She is taking an SSRI (Paroxetine) and 2 SNRIs (Wellbutrin and Effexor) -This is likely too much serotonin -Will hold Effexor for now -Suggest short-term PCP/psych f/u to discuss this issue  CKD -BUN 21/Creatinine 1.33/GFR 43; prior 22/1.11/55 on 4/18 -Will follow  Anemia -Hgb 10.3; prior 10.8 on 4/18 -Will follow   DVT prophylaxis:  Lovenox  Code Status:  Full - confirmed with patient Family Communication: None present Disposition Plan:  Home once clinically improved Consults called: Cardiology via [place] message  Admission status: It is my clinical opinion that referral for OBSERVATION is reasonable and necessary in this patient based on the above information provided. The aforementioned taken together are felt to place the patient at high risk for further clinical deterioration. However it is anticipated that the patient may be medically stable for discharge from the hospital within 24 to 48 hours.    Karmen Bongo MD [facility]  If 7PM-7AM, please contact night-coverage [email or web address] Password [facility]  03/09/2017, 9:27 PM

## 2017-03-09 NOTE — ED Triage Notes (Signed)
Pt to ER for evaluation of left chest tightness radiating down left arm and through to her back. Reports associated shortness of breath, diaphoresis, and nausea/vomiting. States previous heart cath's, denies ever having stents placed. Non-smoker. A/o x4. States pain 10/10.

## 2017-03-09 NOTE — ED Notes (Signed)
Main lab to add on BNP 

## 2017-03-10 ENCOUNTER — Observation Stay (HOSPITAL_BASED_OUTPATIENT_CLINIC_OR_DEPARTMENT_OTHER): Payer: BLUE CROSS/BLUE SHIELD

## 2017-03-10 DIAGNOSIS — E785 Hyperlipidemia, unspecified: Secondary | ICD-10-CM

## 2017-03-10 DIAGNOSIS — E1149 Type 2 diabetes mellitus with other diabetic neurological complication: Secondary | ICD-10-CM | POA: Diagnosis not present

## 2017-03-10 DIAGNOSIS — E1140 Type 2 diabetes mellitus with diabetic neuropathy, unspecified: Secondary | ICD-10-CM | POA: Diagnosis not present

## 2017-03-10 DIAGNOSIS — Z794 Long term (current) use of insulin: Secondary | ICD-10-CM

## 2017-03-10 DIAGNOSIS — J449 Chronic obstructive pulmonary disease, unspecified: Secondary | ICD-10-CM | POA: Diagnosis not present

## 2017-03-10 DIAGNOSIS — R079 Chest pain, unspecified: Secondary | ICD-10-CM | POA: Diagnosis not present

## 2017-03-10 DIAGNOSIS — I10 Essential (primary) hypertension: Secondary | ICD-10-CM | POA: Diagnosis not present

## 2017-03-10 DIAGNOSIS — R072 Precordial pain: Secondary | ICD-10-CM | POA: Diagnosis not present

## 2017-03-10 DIAGNOSIS — E7800 Pure hypercholesterolemia, unspecified: Secondary | ICD-10-CM | POA: Diagnosis not present

## 2017-03-10 DIAGNOSIS — D508 Other iron deficiency anemias: Secondary | ICD-10-CM | POA: Diagnosis not present

## 2017-03-10 DIAGNOSIS — E1142 Type 2 diabetes mellitus with diabetic polyneuropathy: Secondary | ICD-10-CM | POA: Diagnosis not present

## 2017-03-10 DIAGNOSIS — I5032 Chronic diastolic (congestive) heart failure: Secondary | ICD-10-CM | POA: Diagnosis not present

## 2017-03-10 DIAGNOSIS — F339 Major depressive disorder, recurrent, unspecified: Secondary | ICD-10-CM

## 2017-03-10 DIAGNOSIS — E1165 Type 2 diabetes mellitus with hyperglycemia: Secondary | ICD-10-CM | POA: Diagnosis not present

## 2017-03-10 DIAGNOSIS — R0789 Other chest pain: Secondary | ICD-10-CM | POA: Diagnosis not present

## 2017-03-10 DIAGNOSIS — F419 Anxiety disorder, unspecified: Secondary | ICD-10-CM | POA: Diagnosis not present

## 2017-03-10 DIAGNOSIS — D860 Sarcoidosis of lung: Secondary | ICD-10-CM | POA: Diagnosis not present

## 2017-03-10 DIAGNOSIS — N183 Chronic kidney disease, stage 3 (moderate): Secondary | ICD-10-CM | POA: Diagnosis not present

## 2017-03-10 DIAGNOSIS — F329 Major depressive disorder, single episode, unspecified: Secondary | ICD-10-CM | POA: Diagnosis not present

## 2017-03-10 DIAGNOSIS — I130 Hypertensive heart and chronic kidney disease with heart failure and stage 1 through stage 4 chronic kidney disease, or unspecified chronic kidney disease: Secondary | ICD-10-CM | POA: Diagnosis not present

## 2017-03-10 DIAGNOSIS — I493 Ventricular premature depolarization: Secondary | ICD-10-CM | POA: Diagnosis not present

## 2017-03-10 DIAGNOSIS — E1122 Type 2 diabetes mellitus with diabetic chronic kidney disease: Secondary | ICD-10-CM | POA: Diagnosis not present

## 2017-03-10 HISTORY — PX: OTHER SURGICAL HISTORY: SHX169

## 2017-03-10 HISTORY — PX: NM MYOVIEW LTD: HXRAD82

## 2017-03-10 LAB — PROTIME-INR
INR: 1.04
Prothrombin Time: 13.6 seconds (ref 11.4–15.2)

## 2017-03-10 LAB — BASIC METABOLIC PANEL
Anion gap: 8 (ref 5–15)
BUN: 21 mg/dL — ABNORMAL HIGH (ref 6–20)
CO2: 30 mmol/L (ref 22–32)
Calcium: 9.5 mg/dL (ref 8.9–10.3)
Chloride: 102 mmol/L (ref 101–111)
Creatinine, Ser: 1.26 mg/dL — ABNORMAL HIGH (ref 0.44–1.00)
GFR calc Af Amer: 54 mL/min — ABNORMAL LOW (ref >60)
GFR calc non Af Amer: 46 mL/min — ABNORMAL LOW (ref >60)
Glucose, Bld: 92 mg/dL (ref 65–99)
Potassium: 4.4 mmol/L (ref 3.5–5.1)
Sodium: 140 mmol/L (ref 135–145)

## 2017-03-10 LAB — TROPONIN I
Troponin I: <0.03 ng/mL (ref <0.03)
Troponin I: <0.03 ng/mL (ref <0.03)

## 2017-03-10 LAB — CBC
HCT: 32.4 % — ABNORMAL LOW (ref 36.0–46.0)
Hemoglobin: 10 g/dL — ABNORMAL LOW (ref 12.0–15.0)
MCH: 27.2 pg (ref 26.0–34.0)
MCHC: 30.9 g/dL (ref 30.0–36.0)
MCV: 88 fL (ref 78.0–100.0)
Platelets: 370 10*3/uL (ref 150–400)
RBC: 3.68 MIL/uL — ABNORMAL LOW (ref 3.87–5.11)
RDW: 15 % (ref 11.5–15.5)
WBC: 12.2 10*3/uL — ABNORMAL HIGH (ref 4.0–10.5)

## 2017-03-10 LAB — RAPID URINE DRUG SCREEN, HOSP PERFORMED
Amphetamines: NOT DETECTED
Barbiturates: NOT DETECTED
Benzodiazepines: NOT DETECTED
Cocaine: NOT DETECTED
Opiates: POSITIVE — AB
Tetrahydrocannabinol: NOT DETECTED

## 2017-03-10 LAB — LIPID PANEL
Cholesterol: 163 mg/dL (ref 0–200)
HDL: 35 mg/dL — ABNORMAL LOW (ref >40)
LDL Cholesterol: 49 mg/dL (ref 0–99)
Total CHOL/HDL Ratio: 4.7 RATIO
Triglycerides: 395 mg/dL — ABNORMAL HIGH (ref <150)
VLDL: 79 mg/dL — ABNORMAL HIGH (ref 0–40)

## 2017-03-10 LAB — NM MYOCAR MULTI W/SPECT W/WALL MOTION / EF
Estimated workload: 1 METS
Exercise duration (min): 5 min
Exercise duration (sec): 1 s
Peak HR: 87 {beats}/min
Rest HR: 72 {beats}/min

## 2017-03-10 LAB — GLUCOSE, CAPILLARY
Glucose-Capillary: 84 mg/dL (ref 65–99)
Glucose-Capillary: 103 mg/dL — ABNORMAL HIGH (ref 65–99)
Glucose-Capillary: 342 mg/dL — ABNORMAL HIGH (ref 65–99)
Glucose-Capillary: 325 mg/dL — ABNORMAL HIGH (ref 65–99)

## 2017-03-10 LAB — MRSA PCR SCREENING: MRSA by PCR: NEGATIVE

## 2017-03-10 MED ORDER — SODIUM CHLORIDE 0.9% FLUSH
3.0000 mL | Freq: Two times a day (BID) | INTRAVENOUS | Status: DC
  Administered 2017-03-10 – 2017-03-11 (×2): 3 mL via INTRAVENOUS

## 2017-03-10 MED ORDER — REGADENOSON 0.4 MG/5ML IV SOLN
INTRAVENOUS | Status: AC
  Filled 2017-03-10: qty 5

## 2017-03-10 MED ORDER — REGADENOSON 0.4 MG/5ML IV SOLN
0.4000 mg | Freq: Once | INTRAVENOUS | Status: AC
  Administered 2017-03-10: 0.4 mg via INTRAVENOUS
  Filled 2017-03-10: qty 5

## 2017-03-10 MED ORDER — SODIUM CHLORIDE 0.9 % IV SOLN: 250.0000 mL | INTRAVENOUS | Status: DC | PRN

## 2017-03-10 MED ORDER — SODIUM CHLORIDE 0.9% FLUSH: 3.0000 mL | INTRAVENOUS | Status: DC | PRN

## 2017-03-10 MED ORDER — TECHNETIUM TC 99M TETROFOSMIN IV KIT
10.0000 | PACK | Freq: Once | INTRAVENOUS | Status: AC | PRN
  Administered 2017-03-10: 10 via INTRAVENOUS

## 2017-03-10 MED ORDER — SODIUM CHLORIDE 0.9 % IV SOLN
INTRAVENOUS | Status: DC
  Administered 2017-03-11: 06:00:00 via INTRAVENOUS

## 2017-03-10 MED ORDER — ASPIRIN 81 MG PO CHEW
81.0000 mg | CHEWABLE_TABLET | ORAL | Status: AC
  Administered 2017-03-11: 81 mg via ORAL
  Filled 2017-03-10: qty 1

## 2017-03-10 MED ORDER — TECHNETIUM TC 99M TETROFOSMIN IV KIT
30.0000 | PACK | Freq: Once | INTRAVENOUS | Status: AC | PRN
  Administered 2017-03-10: 30 via INTRAVENOUS

## 2017-03-10 NOTE — Progress Notes (Signed)
PROGRESS NOTE    [name]  EQA:[id number] DOB: 06-02-1961 DOA: 03/09/2017 PCP: [name], MD   Chief Complaint  Patient presents with  . Chest Pain    Brief Narrative:  HPI On 03/09/2017 by Dr. Karmen Bongo [name] is a 56 y.o. female with medical history significant of gastric bypass; pulmonary sarcoidosis; HTN; HLD; MRSA wound infection; DM; COPD; and chronic diastolic heart failure (Echo 11/15 showed preserved EF and grade 1 diastolic dysfunction) presenting with chest pain.  Last night she developed substernal chest pain into the back.  Not bad last night but worse today with sweating, radiation down left arm, tingling in fingers, N/V.  Symptoms started worsening about 2pm.  Worse with exertion.  Better with rest and NTG.  H/o catheterization very remotely, last stress test was in 2014. Assessment & Plan   Chest pain -With typical and atypical features -Improved with nitroglycerin -Cardiology consult and appreciated -Troponin cycled, currently negative -Lexiscan showed EF 55%, intermediate risk stratification -Pending further recommendations from cardiology -Continue aspirin, statin, Coreg  Essential Hypertension -Continue Coreg, Lasix, Cardizem, losartan  Chronic diastolic heart failure -Echocardiogram 09/02/2014 showed a grade 1 diastolic dysfunction, EF of 55-60% -Continue Lasix -Monitor intake and output, daily weights -Currently appears euvolemic -BNP 47.6  Hyperlipidemia -Continue statin, TriCor -Lipid panel: total cholesterol 163, HDL 35, LDL 49, triglycerides 395  Diabetes mellitus, type II with neuropathy -Continue Lantus, insulin by scale CBG monitoring -Glyburide, metformin held (patient also takes Toujeo and Novolog at home) -Continue gabapentin  Depression/anxiety -Effexor held -Continue Paroxetine and Wellbutrin -Not sure if patient should be on SSRI and 2 SNRIs. Patient will need to discuss these medications with her primary  care physician or psychiatrist upon discharge  Chronic kidney disease, stage -Creatinine appears to be at baseline, currently 1.26 -Continue to monitor BMP  Chronic normocytic Anemia -Hemoglobin currently 10, appears to be at baseline -Continue to monitor CBC  Sarcoidosis -Continue prednisolone  GERD -Continue PPI  DVT Prophylaxis  Lovenox  Code Status: Full  Family Communication: Husband at bedside  Disposition Plan: Observation. Pending further cardiology recommendations and workup. Home when stable  Consultants Cardiology  Procedures  Lexiscan  Antibiotics   Anti-infectives    None      Subjective:   [name] seen and examined today.  Denies further chest pain or shortness of breath. Chest pain has been intermittent. Patient has had issues with acid reflux in the past causing her to have similar presentation. Currently denies any abdominal pain, nausea or vomiting, diarrhea or constipation.  Objective:   Vitals:   03/10/17 1050 03/10/17 1052 03/10/17 1054 03/10/17 1250  BP: (!) 177/67 (!) 188/69 (!) 174/70 (!) 117/56  Pulse: 78 84 83 73  Resp:    18  Temp:    97.4 F (36.3 C)  TempSrc:    Oral  SpO2:    100%  Weight:      Height:        Intake/Output Summary (Last 24 hours) at 03/10/17 1402 Last data filed at 03/10/17 1250  Gross per 24 hour  Intake              120 ml  Output             2300 ml  Net            -2180 ml   Filed Weights   03/09/17 1556 03/09/17 2015  Weight: 113.4 kg (250 lb) 113.9 kg (251 lb)  Exam  General: Well developed, well nourished, NAD, appears stated age  HEENT: NCAT,  mucous membranes moist.   Cardiovascular: S1 S2 auscultated, no rubs, murmurs or gallops. Regular rate and rhythm.  Respiratory: Clear to auscultation bilaterally with equal chest rise  Abdomen: Soft, obese, nontender, nondistended, + bowel sounds  Extremities: warm dry without cyanosis clubbing or edema  Neuro: AAOx3,  nonfocal  Psych: Normal affect and demeanor with intact judgement and insight   Data Reviewed: I have personally reviewed following labs and imaging studies  CBC:  Recent Labs Lab 03/09/17 1556 03/10/17 0750  WBC 13.9* 12.2*  HGB 10.3* 10.0*  HCT 32.4* 32.4*  MCV 87.1 88.0  PLT 438* 332   Basic Metabolic Panel:  Recent Labs Lab 03/09/17 1556 03/10/17 0750  NA 137 140  K 4.5 4.4  CL 101 102  CO2 27 30  GLUCOSE 201* 92  BUN 21* 21*  CREATININE 1.33* 1.26*  CALCIUM 9.5 9.5   GFR: Estimated Creatinine Clearance: 63.1 mL/min (A) (by C-G formula based on SCr of 1.26 mg/dL (H)). Liver Function Tests: No results for input(s): AST, ALT, ALKPHOS, BILITOT, PROT, ALBUMIN in the last 168 hours. No results for input(s): LIPASE, AMYLASE in the last 168 hours. No results for input(s): AMMONIA in the last 168 hours. Coagulation Profile:  Recent Labs Lab 03/10/17 0750  INR 1.04   Cardiac Enzymes:  Recent Labs Lab 03/09/17 2030 03/10/17 0204 03/10/17 0750  TROPONINI <0.03 <0.03 <0.03   BNP (last 3 results) No results for input(s): PROBNP in the last 8760 hours. HbA1C: No results for input(s): HGBA1C in the last 72 hours. CBG:  Recent Labs Lab 03/09/17 2019 03/10/17 0632 03/10/17 1145  GLUCAP 168* 84 103*   Lipid Profile:  Recent Labs  03/10/17 0204  CHOL 163  HDL 35*  LDLCALC 49  TRIG 395*  CHOLHDL 4.7   Thyroid Function Tests:  Recent Labs  03/09/17 2030  TSH 2.665   Anemia Panel: No results for input(s): VITAMINB12, FOLATE, FERRITIN, TIBC, IRON, RETICCTPCT in the last 72 hours. Urine analysis:    Component Value Date/Time   COLORURINE AMBER (A) 10/04/2015 1444   APPEARANCEUR CLEAR 10/04/2015 1444   LABSPEC 1.021 10/04/2015 1444   PHURINE 5.0 10/04/2015 1444   GLUCOSEU NEGATIVE 10/04/2015 1444   HGBUR NEGATIVE 10/04/2015 1444   HGBUR negative 10/20/2010 0814   BILIRUBINUR n 11/12/2015 1535   KETONESUR NEGATIVE 10/04/2015 1444    PROTEINUR n 11/12/2015 1535   PROTEINUR NEGATIVE 10/04/2015 1444   UROBILINOGEN 0.2 11/12/2015 1535   UROBILINOGEN 0.2 04/27/2015 1226   NITRITE n 11/12/2015 1535   NITRITE NEGATIVE 10/04/2015 1444   LEUKOCYTESUR small (1+) (A) 11/12/2015 1535   Sepsis Labs: @LABRCNTIP (procalcitonin:4,lacticidven:4)  ) Recent Results (from the past 240 hour(s))  MRSA PCR Screening     Status: None   Collection Time: 03/09/17  8:29 PM  Result Value Ref Range Status   MRSA by PCR NEGATIVE NEGATIVE Final    Comment:        The GeneXpert MRSA Assay (FDA approved for NASAL specimens only), is one component of a comprehensive MRSA colonization surveillance program. It is not intended to diagnose MRSA infection nor to guide or monitor treatment for MRSA infections.       Radiology Studies: Dg Chest 2 View  Result Date: 03/09/2017 CLINICAL DATA:  Central left-sided chest pain with left arm numbness, tingling, and shortness of breath since last night. Nausea. History of sarcoidosis. EXAM: CHEST  2 VIEW  COMPARISON:  12/03/2015 FINDINGS: The cardiac silhouette appears slightly enlarged. No airspace consolidation, edema, pleural effusion, or pneumothorax is identified. Mild chronic bronchitic changes are similar to the prior study. Upper abdominal surgical clips are noted. No acute osseous abnormality is seen. IMPRESSION: No active cardiopulmonary disease. Electronically Signed   By: Logan Bores M.D.   On: 03/09/2017 16:31   Nm Myocar Multi W/spect W/wall Motion / Ef  Result Date: 03/10/2017 CLINICAL DATA:  Chest pain EXAM: MYOCARDIAL IMAGING WITH SPECT (REST AND PHARMACOLOGIC-STRESS) GATED LEFT VENTRICULAR WALL MOTION STUDY LEFT VENTRICULAR EJECTION FRACTION TECHNIQUE: Standard myocardial SPECT imaging was performed after resting intravenous injection of 10 mCi Tc-41m tetrofosmin. Subsequently, intravenous infusion of Lexiscan was performed under the supervision of the Cardiology staff. At peak effect of  the drug, 30 mCi Tc-62m tetrofosmin was injected intravenously and standard myocardial SPECT imaging was performed. Quantitative gated imaging was also performed to evaluate left ventricular wall motion, and estimate left ventricular ejection fraction. COMPARISON:  None. FINDINGS: Perfusion: There is a moderate-sized perfusion defect at the apex extending into the lateral wall on stress imaging which re- perfuses on rest imaging. There is also an ill-defined stress-induced perfusion defect within the lateral wall extending for the base to the apex. Wall Motion: Normal left ventricular wall motion. No left ventricular dilation. Left Ventricular Ejection Fraction: 55 % End diastolic volume 161 ml End systolic volume 64 ml IMPRESSION: 1. There is a moderate-sized stress-induced perfusion defect at the apex as well as ill-defined stress-induced perfusion defect in the lateral. 2. Normal left ventricular wall motion. 3. Left ventricular ejection fraction 55% 4. Non invasive risk stratification*: Intermediate *2012 Appropriate Use Criteria for Coronary Revascularization Focused Update: J Am Coll Cardiol. 0960;45(4):098-119. [email or web address] Electronically Signed   By: Marybelle Killings M.D.   On: 03/10/2017 13:14     Scheduled Meds: . allopurinol  100 mg Oral QHS  . aspirin EC  81 mg Oral Daily  . atorvastatin  40 mg Oral q1800  . buPROPion  300 mg Oral Daily  . carvedilol  25 mg Oral BID WC  . diltiazem  120 mg Oral Daily  . enoxaparin (LOVENOX) injection  40 mg Subcutaneous Q24H  . fenofibrate  160 mg Oral Daily  . fluticasone  2 spray Each Nare Daily  . furosemide  40 mg Oral BID  . gabapentin  100 mg Oral TID  . insulin aspart  0-15 Units Subcutaneous TID WC  . insulin aspart  0-5 Units Subcutaneous QHS  . insulin glargine  45 Units Subcutaneous QHS  . losartan  50 mg Oral Daily  . mometasone-formoterol  2 puff Inhalation BID  . pantoprazole  40 mg Oral Daily   . prednisoLONE  5 mg Oral Daily  . sodium chloride flush  3 mL Intravenous Q12H   Continuous Infusions: . sodium chloride       LOS: 0 days   Time Spent in minutes   30 minutes  Monque Haggar D.O. on 03/10/2017 at 2:02 PM  Between 7am to 7pm - Pager - [phone]  After 7pm go to [email or web address] - password [email or web address]  And look for the night coverage person covering for me after hours  [facility] Office  [phone]

## 2017-03-10 NOTE — Progress Notes (Signed)
Pt complains of diarrhea. Paged attending MD. Order obtained for c.dif screen. Pt and family educated.  Fritz Pickerel, RN

## 2017-03-10 NOTE — Progress Notes (Signed)
Pt with left chest pain 7/10 at 0815. Pt currently up brushing teeth. Describes it as radiating down to fingers on left arm. Given 1 nitroglycerin. Pain decreased to 4/10. Pt refused further nitroglycerin stating she is "comfortable". Dr. Ree Kida made aware - said to wait for cardiology recommendations.   Fritz Pickerel, RN

## 2017-03-10 NOTE — Consult Note (Signed)
Primary cardiologist: Dr Ellyn Hack  HPI: 56 year old female with past medical history of gastric bypass surgery, sarcoidosis, hypertension, hyperlipidemia, diabetes mellitus, COPD, chronic diastolic congestive heart failure who I am asked to evaluate for chest pain by Karmen Bongo MD. cardiac catheterization July 2014 showed normal coronary arteries with mild pulmonary hypertension. Nuclear study May 2014 showed breast attenuation but no ischemia or infarction. Ejection fraction 50%. Last echocardiogram November 2015 showed normal LV systolic function, mild left ventricular hypertrophy, mild diastolic dysfunction and mild left atrial enlargement. Patient had an episode of chest discomfort Tuesday evening. It was described as a pressure in the left upper chest radiating to her neck and left upper extremity. It was not pleuritic or positional. There was associated nausea, diaphoresis and dyspnea by her report. Lasted 15 minutes and resolved. She had recurrence at 1 PM yesterday that lasted 6 hours and resolved with nitroglycerin. Presently pain-free and cardiology asked to evaluate. She typically does not have dyspnea on exertion, orthopnea, PND, pedal edema, exertional chest pain or syncope.   Medications Prior to Admission  Medication Sig Dispense Refill  . albuterol (PROVENTIL HFA;VENTOLIN HFA) 108 (90 BASE) MCG/ACT inhaler Inhale 1-2 puffs into the lungs every 6 (six) hours as needed for wheezing or shortness of breath. 8 g 5  . allopurinol (ZYLOPRIM) 100 MG tablet Take 100 mg by mouth at bedtime.     [name] aspirin EC 81 MG tablet Take 81 mg by mouth daily.     [name] atorvastatin (LIPITOR) 40 MG tablet TAKE 1 TAB BY MOUTH ONCE DAILY 90 tablet 3  . benzonatate (TESSALON) 100 MG capsule Take 100 mg by mouth 3 (three) times daily as needed for cough. Reported on 01/05/2016    . buPROPion (WELLBUTRIN XL) 300 MG 24 hr tablet TAKE 1 TABLET (300 MG TOTAL) BY MOUTH DAILY. 90 tablet 3  . carvedilol (COREG) 25 MG  tablet Take 1 tablet (25 mg total) by mouth 2 (two) times daily with a meal. 180 tablet 3  . chlorpheniramine-HYDROcodone (TUSSIONEX) 10-8 MG/5ML SUER Take 5 mLs by mouth daily as needed for cough. Reported on 01/05/2016  0  . DEXILANT 60 MG capsule TAKE ONE CAPSULE BY MOUTH EVERY DAY 30 capsule 5  . diclofenac sodium (VOLTAREN) 1 % GEL Apply 2 g topically 4 (four) times daily. 100 g 0  . diltiazem (CARDIZEM CD) 120 MG 24 hr capsule Take 1 capsule (120 mg total) by mouth daily. 90 capsule 3  . estradiol (ESTRACE) 2 MG tablet Take 2 mg by mouth at bedtime.     . fenofibrate (TRICOR) 145 MG tablet TAKE 1 TABLET (145 MG TOTAL) BY MOUTH DAILY. 30 tablet 6  . fluticasone (FLONASE) 50 MCG/ACT nasal spray Place 2 sprays into both nostrils daily. 48 g 0  . furosemide (LASIX) 40 MG tablet TAKE 1 TABLET BY MOUTH TWICE A DAY 60 tablet 11  . gabapentin (NEURONTIN) 100 MG capsule TAKE ONE CAPSULE BY MOUTH 3 TIMES A DAY (Patient taking differently: Take 100 mg by mouth in the morning and 200 mg at bedtime) 90 capsule 0  . glyBURIDE (DIABETA) 2.5 MG tablet Take 2.5 mg by mouth at bedtime.     . insulin aspart (NOVOLOG FLEXPEN) 100 UNIT/ML FlexPen Inject 12-20 Units into the skin 3 (three) times daily with meals. Per sliding scale 30 mL 5  . Insulin Glargine (TOUJEO SOLOSTAR) 300 UNIT/ML SOPN Inject 45 Units into the skin daily. (Patient taking differently: Inject 45 Units into the skin at  bedtime. ) 5 pen 5  . losartan (COZAAR) 50 MG tablet TAKE 1 TABLET (50 MG TOTAL) BY MOUTH DAILY.  3  . metFORMIN (GLUCOPHAGE) 1000 MG tablet TAKE ONE TABLET BY MOUTH TWICE DAILY WITH MEALS 180 tablet 0  . mometasone-formoterol (DULERA) 200-5 MCG/ACT AERO Inhale 2 puffs into the lungs 2 (two) times daily. 13 g 5  . ondansetron (ZOFRAN-ODT) 4 MG disintegrating tablet Take 4 mg by mouth every 8 (eight) hours as needed for nausea or vomiting. Reported on 01/05/2016  0  . orlistat (XENICAL) 120 MG capsule Take 1 capsule (120 mg total)  by mouth 3 (three) times daily with meals. 90 capsule 2  . Potassium Chloride ER 20 MEQ TBCR TAKE ONE TABLET BY MOUTH THREE TIMES DAILY (Patient taking differently: Take 30 mEq by mouth two times a day) 90 tablet 3  . prednisoLONE 5 MG TABS tablet Take 5 mg by mouth daily.    . traMADol (ULTRAM) 50 MG tablet Take 1 tablet (50 mg total) by mouth every 12 (twelve) hours as needed for moderate pain or severe pain. 30 tablet 2  . venlafaxine XR (EFFEXOR-XR) 75 MG 24 hr capsule Take 2 capsules (150 mg total) by mouth at bedtime.    . vitamin B-12 (CYANOCOBALAMIN) 1000 MCG tablet Take 1,000 mcg by mouth daily.    [name] glucose blood (ONE TOUCH ULTRA TEST) test strip 1 each by Other route 3 (three) times daily. Use as instructed 100 each 5  . Insulin Pen Needle 33G X 4 MM MISC 1 each by Does not apply route 4 (four) times daily. 200 each 11  . LORazepam (ATIVAN) 1 MG tablet     . nitroGLYCERIN (NITROSTAT) 0.4 MG SL tablet Place 0.4 mg under the tongue every 5 (five) minutes as needed for chest pain. Reported on 01/05/2016    . PARoxetine Mesylate (BRISDELLE) 7.5 MG CAPS Take 7.5 mg by mouth daily.      Allergies  Allergen Reactions  . Methotrexate Other (See Comments)    Peri-oral and buccal lesions  . Vancomycin Other (See Comments)    Nephrotoxicity   . Clindamycin/Lincomycin Nausea And Vomiting and Rash  . Chlorhexidine Itching  . Doxycycline Rash  . Lisinopril Cough  . Teflaro [Ceftaroline] Rash    Past Medical History:  Diagnosis Date  . Abnormal SPEP 04/17/2014  . Acute left ankle pain 01/26/2017  . ANEMIA-UNSPECIFIED 09/18/2009  . Chronic diastolic heart failure, NYHA class 2 (HCC)    LVEDP roughly 20% by cath  . COPD (chronic obstructive pulmonary disease) ([place])   . Depression   . DIABETES MELLITUS, TYPE II 08/21/2006  . Diabetic osteomyelitis ([place]) 05/29/2015  . Fracture of 5th metatarsal    non union  . GERD 08/21/2006  . GOUT 08/20/2010  . Hx of umbilical hernia repair   .  HYPERLIPIDEMIA 08/21/2006  . HYPERTENSION 08/21/2006  . Infection of wound due to methicillin resistant Staphylococcus aureus (MRSA)   . Internal hemorrhoids   . Multiple allergies 10/14/2016  . OBESITY 06/04/2009  . Onychomycosis 10/27/2015  . Osteomyelitis of left foot ([place]) 05/29/2015  . Pulmonary sarcoidosis ([place])    Followed locally by pulmonology, but also by Dr. Casper Harrison at [facility]  . Right knee pain 01/26/2017  . Vocal cord dysfunction   . Wears partial dentures     Past Surgical History:  Procedure Laterality Date  . ABDOMINAL HYSTERECTOMY    . APPENDECTOMY    . BLADDER SUSPENSION  11/11/2011  Procedure: TRANSVAGINAL TAPE (TVT) PROCEDURE;  Surgeon: Olga Millers, MD;  Location: [facility];  Service: Gynecology;  Laterality: N/A;  . CARDIAC CATHETERIZATION  07/2010   LVEF 50-55% WITH VERY MILD GLOBAL HYPOKINESIA; ESSENTIALLY NORMAL CORONARY ARTERIES; NORMAL LV FUNCTION  . CAROTIDS  02/18/11   CAROTID DUPLEX; VERTEBRALS ARE PATENT WITH ANTEGRADE FLOW. ICA/CCA RATIO 1.61 ON RIGHT AND 0.75 ON LEFT  . CHOLECYSTECTOMY  1984  . CYSTOSCOPY  11/11/2011   Procedure: CYSTOSCOPY;  Surgeon: Olga Millers, MD;  Location: [facility];  Service: Gynecology;  Laterality: N/A;  . DOPPLER ECHOCARDIOGRAPHY  02/12/2013   LV FUNCTION, SIZE NORMAL; MILD CONCENTRIC LVH; EST EF 55-65%; WALL MOTION NORMAL  . FRACTURE SURGERY     foot  . HERNIA REPAIR    . I&D EXTREMITY Left 06/27/2015   Procedure: Partial Excision Left Calcaneus, Place Antibiotic Beads, and Wound VAC;  Surgeon: Newt Minion, MD;  Location: [facility];  Service: Orthopedics;  Laterality: Left;  . KNEE ARTHROSCOPY     right  . LEFT AND RIGHT HEART CATHETERIZATION WITH CORONARY ANGIOGRAM N/A 04/23/2013   Procedure: LEFT AND RIGHT HEART CATHETERIZATION WITH CORONARY ANGIOGRAM;  Surgeon: Leonie Man, MD;  Location: [name];  Service: Cardiovascular;  Laterality: N/A;  . LexiScan Myoview  03/09/2013   EF 50%; NORMAL MYOCARDIAL  PERFUSION STUDY - breast attenuation  . METATARSAL OSTEOTOMY WITH OPEN REDUCTION INTERNAL FIXATION (ORIF) METATARSAL WITH FUSION Left 04/09/2014   Procedure: LEFT FOOT FRACTURE OPEN TREATMENT METATARSAL INCLUDES INTERNAL FIXATION EACH;  Surgeon: Lorn Junes, MD;  Location: [facility];  Service: Orthopedics;  Laterality: Left;  . NISSEN FUNDOPLICATION  4315  . Right and left CARDIAC CATHETERIZATION  04/23/2013   Angiographic normal coronaries; LVEDP 20 mmHg, PCWP 12-14 mmHg, RAP 12 mmHg.; Fick CO/CI 4.9/2.2  . TUBAL LIGATION     with reversal in 1994  . VENTRAL HERNIA REPAIR      Social History   Social History  . Marital status: Married    Spouse name: TRENICE MESA  . Number of children: 2  . Years of education: 12   Occupational History  . DISABLED    Social History Main Topics  . Smoking status: Never Smoker  . Smokeless tobacco: Never Used  . Alcohol use No  . Drug use: No  . Sexual activity: Yes    Birth control/ protection: Surgical   Other Topics Concern  . Not on file   Social History Narrative   Married 1994. 2 sons who both live close and 1 grandson.       Disability due to sarcoidosis. 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 Diabetes Father   . Heart attack Father 36  . Coronary artery disease Father   . Heart failure Father   . COPD Mother   . Emphysema Mother   . Asthma Mother   . Heart failure Mother   . Heart attack Maternal Grandfather   . Sarcoidosis Maternal Uncle   . Lung cancer Brother   . Diabetes Brother   . Colon cancer Neg Hx     ROS:  no fevers or chills, productive cough, hemoptysis, dysphasia, odynophagia, melena, hematochezia, dysuria, hematuria, rash, seizure activity, orthopnea, PND, pedal edema, claudication.  Remaining systems  are negative.  Physical Exam:   Blood pressure (!) 149/69, pulse 68, temperature 97.8 F (36.6 C), temperature source Oral, resp. rate 18, height _0  (1.676 m), weight 113.9 kg (251 lb), SpO2 98 %.  General:  Well developed/obese in NAD Skin warm/dry Patient not depressed No peripheral clubbing Back-normal HEENT-normal/normal eyelids Neck supple/normal carotid upstroke bilaterally; no bruits; no JVD; no thyromegaly chest - CTA/ normal expansion CV - RRR/normal S1 and S2; no murmurs, rubs or gallops;  PMI nondisplaced Abdomen -NT/ND, no HSM, no mass, + bowel sounds, no bruit 2+ femoral pulses, no bruits Ext-no edema, chords, 2+ DP Neuro-grossly nonfocal  ECG - normal sinus rhythm, PVC, no ST changes. personally reviewed  Results for orders placed or performed during the hospital encounter of 03/09/17 (from the past 48 hour(s))  Basic metabolic panel     Status: Abnormal   Collection Time: 03/09/17  3:56 PM  Result Value Ref Range   Sodium 137 135 - 145 mmol/L   Potassium 4.5 3.5 - 5.1 mmol/L   Chloride 101 101 - 111 mmol/L   CO2 27 22 - 32 mmol/L   Glucose, Bld 201 (H) 65 - 99 mg/dL   BUN 21 (H) 6 - 20 mg/dL   Creatinine, Ser 1.33 (H) 0.44 - 1.00 mg/dL   Calcium 9.5 8.9 - 10.3 mg/dL   GFR calc non Af Amer 43 (L) >60 mL/min   GFR calc Af Amer 50 (L) >60 mL/min    Comment: (NOTE) The eGFR has been calculated using the CKD EPI equation. This calculation has not been validated in all clinical situations. eGFR's persistently <60 mL/min signify possible Chronic Kidney Disease.    Anion gap 9 5 - 15  CBC     Status: Abnormal   Collection Time: 03/09/17  3:56 PM  Result Value Ref Range   WBC 13.9 (H) 4.0 - 10.5 K/uL   RBC 3.72 (L) 3.87 - 5.11 MIL/uL   Hemoglobin 10.3 (L) 12.0 - 15.0 g/dL   HCT 32.4 (L) 36.0 - 46.0 %   MCV 87.1 78.0 - 100.0 fL   MCH 27.7 26.0 - 34.0 pg   MCHC 31.8 30.0 - 36.0 g/dL   RDW 14.9 11.5 - 15.5 %   Platelets 438 (H) 150 - 400  K/uL  Brain natriuretic peptide     Status: None   Collection Time: 03/09/17  3:56 PM  Result Value Ref Range   B Natriuretic Peptide 47.6 0.0 - 100.0 pg/mL  I-stat troponin, ED     Status: None   Collection Time: 03/09/17  4:09 PM  Result Value Ref Range   Troponin i, poc 0.00 0.00 - 0.08 ng/mL   Comment 3            Comment: Due to the release kinetics of cTnI, a negative result within the first hours of the onset of symptoms does not rule out myocardial infarction with certainty. If myocardial infarction is still suspected, repeat the test at appropriate intervals.   Glucose, capillary     Status: Abnormal   Collection Time: 03/09/17  8:19 PM  Result Value Ref Range   Glucose-Capillary 168 (H) 65 - 99 mg/dL  MRSA PCR Screening     Status: None   Collection Time: 03/09/17  8:29 PM  Result Value Ref Range   MRSA by PCR NEGATIVE NEGATIVE    Comment:        The GeneXpert MRSA Assay (FDA approved for NASAL specimens only), is one component  of a comprehensive MRSA colonization surveillance program. It is not intended to diagnose MRSA infection nor to guide or monitor treatment for MRSA infections.   TSH     Status: None   Collection Time: 03/09/17  8:30 PM  Result Value Ref Range   TSH 2.665 0.350 - 4.500 uIU/mL    Comment: Performed by a 3rd Generation assay with a functional sensitivity of <=0.01 uIU/mL.  Troponin I     Status: None   Collection Time: 03/09/17  8:30 PM  Result Value Ref Range   Troponin I <0.03 <0.03 ng/mL  Urine rapid drug screen (hosp performed)     Status: Abnormal   Collection Time: 03/09/17 11:52 PM  Result Value Ref Range   Opiates POSITIVE (A) NONE DETECTED   Cocaine NONE DETECTED NONE DETECTED   Benzodiazepines NONE DETECTED NONE DETECTED   Amphetamines NONE DETECTED NONE DETECTED   Tetrahydrocannabinol NONE DETECTED NONE DETECTED   Barbiturates NONE DETECTED NONE DETECTED    Comment:        DRUG SCREEN FOR MEDICAL PURPOSES ONLY.  IF  CONFIRMATION IS NEEDED FOR ANY PURPOSE, NOTIFY LAB WITHIN 5 DAYS.        LOWEST DETECTABLE LIMITS FOR URINE DRUG SCREEN Drug Class       Cutoff (ng/mL) Amphetamine      1000 Barbiturate      200 Benzodiazepine   161 Tricyclics       096 Opiates          300 Cocaine          300 THC              50   Troponin I     Status: None   Collection Time: 03/10/17  2:04 AM  Result Value Ref Range   Troponin I <0.03 <0.03 ng/mL  Lipid panel     Status: Abnormal   Collection Time: 03/10/17  2:04 AM  Result Value Ref Range   Cholesterol 163 0 - 200 mg/dL   Triglycerides 395 (H) <150 mg/dL   HDL 35 (L) >40 mg/dL   Total CHOL/HDL Ratio 4.7 RATIO   VLDL 79 (H) 0 - 40 mg/dL   LDL Cholesterol 49 0 - 99 mg/dL    Comment:        Total Cholesterol/HDL:CHD Risk Coronary Heart Disease Risk Table                     Men   Women  1/2 Average Risk   3.4   3.3  Average Risk       5.0   4.4  2 X Average Risk   9.6   7.1  3 X Average Risk  23.4   11.0        Use the calculated Patient Ratio above and the CHD Risk Table to determine the patient's CHD Risk.        ATP III CLASSIFICATION (LDL):  <100     mg/dL   Optimal  100-129  mg/dL   Near or Above                    Optimal  130-159  mg/dL   Borderline  160-189  mg/dL   High  >190     mg/dL   Very High   Glucose, capillary     Status: None   Collection Time: 03/10/17  6:32 AM  Result Value Ref Range   Glucose-Capillary 84 65 - 99  mg/dL   *Note: Due to a large number of results and/or encounters for the requested time period, some results have not been displayed. A complete set of results can be found in Results Review.    Dg Chest 2 View  Result Date: 03/09/2017 CLINICAL DATA:  Central left-sided chest pain with left arm numbness, tingling, and shortness of breath since last night. Nausea. History of sarcoidosis. EXAM: CHEST  2 VIEW COMPARISON:  12/03/2015 FINDINGS: The cardiac silhouette appears slightly enlarged. No airspace  consolidation, edema, pleural effusion, or pneumothorax is identified. Mild chronic bronchitic changes are similar to the prior study. Upper abdominal surgical clips are noted. No acute osseous abnormality is seen. IMPRESSION: No active cardiopulmonary disease. Electronically Signed   By: Logan Bores M.D.   On: 03/09/2017 16:31    Assessment/Plan 1 chest pain-patient with both typical and atypical features. She does have multiple risk factors but her pain lasted 6 hours with negative enzymes. No ST changes. I will plan to proceed with a [place] nuclear study for risk stratification. If ischemia noted she would require catheterization.   2 hypertension-continue preadmission blood pressure medications and adjust as needed.  3 hyperlipidemia-continue statin.  4 diastolic congestive heart failure-patient is euvolemic on examination. Continue present dose of diuretics.   Kirk Ruths MD 03/10/2017, 8:31 AM

## 2017-03-10 NOTE — Progress Notes (Signed)
    Abnormal stress test:  Perfusion: There is a moderate-sized perfusion defect at the apex extending into the lateral wall on stress imaging which re- perfuses on rest imaging. There is also an ill-defined stress-induced perfusion defect within the lateral wall extending for the base to the apex.  Wall Motion: Normal left ventricular wall motion. No left ventricular dilation.  Left Ventricular Ejection Fraction: 55 %  Reviewed by Dr. Stanford Breed.  Plan for cardiac cath tomorrow afternoon. Will hold lasix until after cath. Gentle hydration to start in the morning.   The patient understands that risks included but are not limited to stroke (1 in 1000), death (1 in 82), kidney failure [usually temporary] (1 in 500), bleeding (1 in 200), allergic reaction [possibly serious] (1 in 200).  She is willing to proceed.  Daune Perch, AGNP-C 03/10/2017  2:32 PM Pager: [phone]

## 2017-03-10 NOTE — Progress Notes (Signed)
    Patient presented for Lexiscan nuclear stress test. Tolerated procedure well. Pending final stress imaging result.  Daune Perch, AGNP-C 03/10/2017  10:58 AM Pager: [phone]

## 2017-03-11 ENCOUNTER — Encounter (HOSPITAL_COMMUNITY)
Admission: EM | Disposition: A | Discharge status: Home / Self Care | Payer: Self-pay | Source: Home / Self Care | Attending: Emergency Medicine

## 2017-03-11 DIAGNOSIS — R079 Chest pain, unspecified: Secondary | ICD-10-CM | POA: Diagnosis not present

## 2017-03-11 DIAGNOSIS — E1140 Type 2 diabetes mellitus with diabetic neuropathy, unspecified: Secondary | ICD-10-CM | POA: Diagnosis not present

## 2017-03-11 DIAGNOSIS — D860 Sarcoidosis of lung: Secondary | ICD-10-CM | POA: Diagnosis not present

## 2017-03-11 DIAGNOSIS — N183 Chronic kidney disease, stage 3 (moderate): Secondary | ICD-10-CM | POA: Diagnosis not present

## 2017-03-11 DIAGNOSIS — D508 Other iron deficiency anemias: Secondary | ICD-10-CM | POA: Diagnosis not present

## 2017-03-11 DIAGNOSIS — I10 Essential (primary) hypertension: Secondary | ICD-10-CM | POA: Diagnosis not present

## 2017-03-11 DIAGNOSIS — F339 Major depressive disorder, recurrent, unspecified: Secondary | ICD-10-CM | POA: Diagnosis not present

## 2017-03-11 DIAGNOSIS — Z794 Long term (current) use of insulin: Secondary | ICD-10-CM | POA: Diagnosis not present

## 2017-03-11 DIAGNOSIS — R0789 Other chest pain: Secondary | ICD-10-CM | POA: Diagnosis not present

## 2017-03-11 DIAGNOSIS — E1165 Type 2 diabetes mellitus with hyperglycemia: Secondary | ICD-10-CM | POA: Diagnosis not present

## 2017-03-11 DIAGNOSIS — R072 Precordial pain: Secondary | ICD-10-CM | POA: Diagnosis not present

## 2017-03-11 DIAGNOSIS — E785 Hyperlipidemia, unspecified: Secondary | ICD-10-CM | POA: Diagnosis not present

## 2017-03-11 HISTORY — PX: LEFT HEART CATH AND CORONARY ANGIOGRAPHY: CATH118249

## 2017-03-11 LAB — GLUCOSE, CAPILLARY
Glucose-Capillary: 194 mg/dL — ABNORMAL HIGH (ref 65–99)
Glucose-Capillary: 81 mg/dL (ref 65–99)
Glucose-Capillary: 119 mg/dL — ABNORMAL HIGH (ref 65–99)
Glucose-Capillary: 177 mg/dL — ABNORMAL HIGH (ref 65–99)

## 2017-03-11 LAB — BASIC METABOLIC PANEL
Anion gap: 9 (ref 5–15)
BUN: 20 mg/dL (ref 6–20)
CO2: 27 mmol/L (ref 22–32)
Calcium: 9 mg/dL (ref 8.9–10.3)
Chloride: 103 mmol/L (ref 101–111)
Creatinine, Ser: 1.1 mg/dL — ABNORMAL HIGH (ref 0.44–1.00)
GFR calc Af Amer: >60 mL/min (ref >60)
GFR calc non Af Amer: 55 mL/min — ABNORMAL LOW (ref >60)
Glucose, Bld: 108 mg/dL — ABNORMAL HIGH (ref 65–99)
Potassium: 3.8 mmol/L (ref 3.5–5.1)
Sodium: 139 mmol/L (ref 135–145)

## 2017-03-11 LAB — HEMOGLOBIN A1C
Hgb A1c MFr Bld: 8.9 % — ABNORMAL HIGH (ref 4.8–5.6)
Mean Plasma Glucose: 209 mg/dL

## 2017-03-11 SURGERY — LEFT HEART CATH AND CORONARY ANGIOGRAPHY
Anesthesia: LOCAL

## 2017-03-11 MED ORDER — LIDOCAINE HCL (PF) 1 % IJ SOLN
INTRAMUSCULAR | Status: DC | PRN
  Administered 2017-03-11: 2 mL via INTRADERMAL

## 2017-03-11 MED ORDER — MIDAZOLAM HCL 2 MG/2ML IJ SOLN
INTRAMUSCULAR | Status: DC | PRN
  Administered 2017-03-11: 2 mg via INTRAVENOUS

## 2017-03-11 MED ORDER — VERAPAMIL HCL 2.5 MG/ML IV SOLN
INTRAVENOUS | Status: AC
  Filled 2017-03-11: qty 2

## 2017-03-11 MED ORDER — IOPAMIDOL (ISOVUE-370) INJECTION 76%
INTRAVENOUS | Status: DC | PRN
  Administered 2017-03-11: 30 mL via INTRAVENOUS

## 2017-03-11 MED ORDER — SODIUM CHLORIDE 0.9 % IV SOLN: 250.0000 mL | INTRAVENOUS | Status: DC | PRN

## 2017-03-11 MED ORDER — LIDOCAINE HCL 1 % IJ SOLN
INTRAMUSCULAR | Status: AC
  Filled 2017-03-11: qty 20

## 2017-03-11 MED ORDER — FENTANYL CITRATE (PF) 100 MCG/2ML IJ SOLN
INTRAMUSCULAR | Status: AC
  Filled 2017-03-11: qty 2

## 2017-03-11 MED ORDER — HEPARIN (PORCINE) IN NACL 2-0.9 UNIT/ML-% IJ SOLN
INTRAMUSCULAR | Status: AC | PRN
  Administered 2017-03-11: 500 mL

## 2017-03-11 MED ORDER — VERAPAMIL HCL 2.5 MG/ML IV SOLN
INTRAVENOUS | Status: DC | PRN
  Administered 2017-03-11: 10 mL via INTRA_ARTERIAL

## 2017-03-11 MED ORDER — FENTANYL CITRATE (PF) 100 MCG/2ML IJ SOLN
INTRAMUSCULAR | Status: DC | PRN
  Administered 2017-03-11: 25 ug via INTRAVENOUS

## 2017-03-11 MED ORDER — HEPARIN (PORCINE) IN NACL 2-0.9 UNIT/ML-% IJ SOLN
INTRAMUSCULAR | Status: AC
  Filled 2017-03-11: qty 1000

## 2017-03-11 MED ORDER — HEPARIN SODIUM (PORCINE) 1000 UNIT/ML IJ SOLN
INTRAMUSCULAR | Status: DC | PRN
  Administered 2017-03-11: 5000 [IU] via INTRAVENOUS

## 2017-03-11 MED ORDER — SODIUM CHLORIDE 0.9% FLUSH
3.0000 mL | Freq: Two times a day (BID) | INTRAVENOUS | Status: DC
  Administered 2017-03-11: 3 mL via INTRAVENOUS

## 2017-03-11 MED ORDER — IOPAMIDOL (ISOVUE-370) INJECTION 76%
INTRAVENOUS | Status: AC
  Filled 2017-03-11: qty 100

## 2017-03-11 MED ORDER — MIDAZOLAM HCL 2 MG/2ML IJ SOLN
INTRAMUSCULAR | Status: AC
  Filled 2017-03-11: qty 2

## 2017-03-11 MED ORDER — SODIUM CHLORIDE 0.9% FLUSH: 3.0000 mL | INTRAVENOUS | Status: DC | PRN

## 2017-03-11 MED ORDER — SODIUM CHLORIDE 0.9 % IV SOLN: INTRAVENOUS | Status: AC

## 2017-03-11 MED ORDER — HEPARIN SODIUM (PORCINE) 1000 UNIT/ML IJ SOLN
INTRAMUSCULAR | Status: AC
  Filled 2017-03-11: qty 1

## 2017-03-11 SURGICAL SUPPLY — 10 items
CATH INFINITI 5 FR JL3.5 (CATHETERS) ×2 IMPLANT
CATH INFINITI JR4 5F (CATHETERS) ×2 IMPLANT
DEVICE RAD COMP TR BAND LRG (VASCULAR PRODUCTS) ×2 IMPLANT
GLIDESHEATH SLEND SS 6F .021 (SHEATH) ×2 IMPLANT
GUIDEWIRE INQWIRE 1.5J.035X260 (WIRE) ×1 IMPLANT
INQWIRE 1.5J .035X260CM (WIRE) ×2
KIT HEART LEFT (KITS) ×2 IMPLANT
PACK CARDIAC CATHETERIZATION (CUSTOM PROCEDURE TRAY) ×2 IMPLANT
TRANSDUCER W/STOPCOCK (MISCELLANEOUS) ×2 IMPLANT
TUBING CIL FLEX 10 FLL-RA (TUBING) ×2 IMPLANT

## 2017-03-11 NOTE — Interval H&P Note (Signed)
Cath Lab Visit (complete for each Cath Lab visit)  Clinical Evaluation Leading to the Procedure:   ACS: No.  Non-ACS:    Anginal Classification: CCS II  Anti-ischemic medical therapy: Maximal Therapy (2 or more classes of medications)  Non-Invasive Test Results: Intermediate-risk stress test findings: cardiac mortality 1-3%/year  Prior CABG: No previous CABG      History and Physical Interval Note:  03/11/2017 5:25 PM  [name]  has presented today for surgery, with the diagnosis of cp, abnormal stress test  The various methods of treatment have been discussed with the patient and family. After consideration of risks, benefits and other options for treatment, the patient has consented to  Procedure(s): Left Heart Cath and Coronary Angiography (N/A) as a surgical intervention .  The patient's history has been reviewed, patient examined, no change in status, stable for surgery.  I have reviewed the patient's chart and labs.  Questions were answered to the patient's satisfaction.     Sherren Mocha

## 2017-03-11 NOTE — H&P (View-Only) (Signed)
Progress Note  Patient Name: [name] Date of Encounter: 03/11/2017  Primary Cardiologist: Dr Ellyn Hack  Subjective   Mild residual chest pain. No dyspnea.  Inpatient Medications    Scheduled Meds: . allopurinol  100 mg Oral QHS  . aspirin EC  81 mg Oral Daily  . atorvastatin  40 mg Oral q1800  . buPROPion  300 mg Oral Daily  . carvedilol  25 mg Oral BID WC  . diltiazem  120 mg Oral Daily  . enoxaparin (LOVENOX) injection  40 mg Subcutaneous Q24H  . fenofibrate  160 mg Oral Daily  . fluticasone  2 spray Each Nare Daily  . gabapentin  100 mg Oral TID  . insulin aspart  0-15 Units Subcutaneous TID WC  . insulin aspart  0-5 Units Subcutaneous QHS  . insulin glargine  45 Units Subcutaneous QHS  . losartan  50 mg Oral Daily  . mometasone-formoterol  2 puff Inhalation BID  . pantoprazole  40 mg Oral Daily  . prednisoLONE  5 mg Oral Daily  . sodium chloride flush  3 mL Intravenous Q12H  . sodium chloride flush  3 mL Intravenous Q12H   Continuous Infusions: . sodium chloride    . sodium chloride    . sodium chloride 75 mL/hr at 03/11/17 0550   PRN Meds: sodium chloride, sodium chloride, acetaminophen, albuterol, nitroGLYCERIN, ondansetron (ZOFRAN) IV, sodium chloride flush, sodium chloride flush, traMADol   Vital Signs    Vitals:   03/10/17 1054 03/10/17 1250 03/10/17 2004 03/11/17 0558  BP: (!) 174/70 (!) 117/56 (!) 124/41 (!) 136/55  Pulse: 83 73 67 72  Resp:  18 18 17   Temp:  97.4 F (36.3 C) 98 F (36.7 C) 97.9 F (36.6 C)  TempSrc:  Oral Oral Oral  SpO2:  100% 99% 100%  Weight:    113 kg (249 lb 1.6 oz)  Height:        Intake/Output Summary (Last 24 hours) at 03/11/17 0757 Last data filed at 03/11/17 0600  Gross per 24 hour  Intake              600 ml  Output             1500 ml  Net             -900 ml   Filed Weights   03/09/17 1556 03/09/17 2015 03/11/17 0558  Weight: 113.4 kg (250 lb) 113.9 kg (251 lb) 113 kg (249 lb 1.6 oz)    Telemetry     Sinus- Personally Reviewed   Physical Exam   GEN: No acute distress.  Obese Neck: Supple Cardiac: RRR Respiratory: Clear to auscultation bilaterally. GI: Soft, nontender, non-distended  MS: No edema Neuro:  Nonfocal  Psych: Normal affect   Labs    Chemistry Recent Labs Lab 03/09/17 1556 03/10/17 0750  NA 137 140  K 4.5 4.4  CL 101 102  CO2 27 30  GLUCOSE 201* 92  BUN 21* 21*  CREATININE 1.33* 1.26*  CALCIUM 9.5 9.5  GFRNONAA 43* 46*  GFRAA 50* 54*  ANIONGAP 9 8     Hematology Recent Labs Lab 03/09/17 1556 03/10/17 0750  WBC 13.9* 12.2*  RBC 3.72* 3.68*  HGB 10.3* 10.0*  HCT 32.4* 32.4*  MCV 87.1 88.0  MCH 27.7 27.2  MCHC 31.8 30.9  RDW 14.9 15.0  PLT 438* 370    Cardiac Enzymes Recent Labs Lab 03/09/17 2030 03/10/17 0204 03/10/17 0750  TROPONINI <0.03 <0.03 <0.03  Recent Labs Lab 03/09/17 1609  TROPIPOC 0.00     BNP Recent Labs Lab 03/09/17 1556  BNP 47.6      Radiology    Dg Chest 2 View  Result Date: 03/09/2017 CLINICAL DATA:  Central left-sided chest pain with left arm numbness, tingling, and shortness of breath since last night. Nausea. History of sarcoidosis. EXAM: CHEST  2 VIEW COMPARISON:  12/03/2015 FINDINGS: The cardiac silhouette appears slightly enlarged. No airspace consolidation, edema, pleural effusion, or pneumothorax is identified. Mild chronic bronchitic changes are similar to the prior study. Upper abdominal surgical clips are noted. No acute osseous abnormality is seen. IMPRESSION: No active cardiopulmonary disease. Electronically Signed   By: Logan Bores M.D.   On: 03/09/2017 16:31   Nm Myocar Multi W/spect W/wall Motion / Ef  Result Date: 03/10/2017 CLINICAL DATA:  Chest pain EXAM: MYOCARDIAL IMAGING WITH SPECT (REST AND PHARMACOLOGIC-STRESS) GATED LEFT VENTRICULAR WALL MOTION STUDY LEFT VENTRICULAR EJECTION FRACTION TECHNIQUE: Standard myocardial SPECT imaging was performed after resting intravenous  injection of 10 mCi Tc-57m tetrofosmin. Subsequently, intravenous infusion of Lexiscan was performed under the supervision of the Cardiology staff. At peak effect of the drug, 30 mCi Tc-76m tetrofosmin was injected intravenously and standard myocardial SPECT imaging was performed. Quantitative gated imaging was also performed to evaluate left ventricular wall motion, and estimate left ventricular ejection fraction. COMPARISON:  None. FINDINGS: Perfusion: There is a moderate-sized perfusion defect at the apex extending into the lateral wall on stress imaging which re- perfuses on rest imaging. There is also an ill-defined stress-induced perfusion defect within the lateral wall extending for the base to the apex. Wall Motion: Normal left ventricular wall motion. No left ventricular dilation. Left Ventricular Ejection Fraction: 55 % End diastolic volume 426 ml End systolic volume 64 ml IMPRESSION: 1. There is a moderate-sized stress-induced perfusion defect at the apex as well as ill-defined stress-induced perfusion defect in the lateral. 2. Normal left ventricular wall motion. 3. Left ventricular ejection fraction 55% 4. Non invasive risk stratification*: Intermediate *2012 Appropriate Use Criteria for Coronary Revascularization Focused Update: J Am Coll Cardiol. 8341;96(2):229-798. http://content.[email or web address]?[email or web address] Electronically Signed   By: Marybelle Killings M.D.   On: 03/10/2017 13:14    Patient Profile     56 y.o. female with past medical history of diabetes mellitus, hypertension, hyperlipidemia, COPD, chronic diastolic congestive heart failure. Patient admitted with chest pain and ruled out. Nuclear study shows ejection fraction 55% with apical ischemia and lateral ischemia.   Assessment & Plan    1 chest pain-nuclear study as outlined above. Plan definitive evaluation today. She will proceed with cardiac catheterization. The risks and benefits including myocardial infarctions,  CVA and death discussed and she agrees to proceed. She has mild baseline renal insufficiency. Lasix has been held. Resume following procedure. Follow renal function after procedure.  2 chronic diastolic congestive heart failure-diuretics are presently on hold prior to catheterization. Resume after procedure.  3 hypertension-blood pressure controlled. Continue present medications.  4 hyperlipidemia-continue statin.  5 chronic stage III kidney disease-follow renal function after procedure.  Signed,  Kirk Ruths, MD  03/11/2017, 7:57 AM

## 2017-03-11 NOTE — Progress Notes (Signed)
Progress Note  Patient Name: [name] Date of Encounter: 03/11/2017  Primary Cardiologist: Dr Ellyn Hack  Subjective   Mild residual chest pain. No dyspnea.  Inpatient Medications    Scheduled Meds: . allopurinol  100 mg Oral QHS  . aspirin EC  81 mg Oral Daily  . atorvastatin  40 mg Oral q1800  . buPROPion  300 mg Oral Daily  . carvedilol  25 mg Oral BID WC  . diltiazem  120 mg Oral Daily  . enoxaparin (LOVENOX) injection  40 mg Subcutaneous Q24H  . fenofibrate  160 mg Oral Daily  . fluticasone  2 spray Each Nare Daily  . gabapentin  100 mg Oral TID  . insulin aspart  0-15 Units Subcutaneous TID WC  . insulin aspart  0-5 Units Subcutaneous QHS  . insulin glargine  45 Units Subcutaneous QHS  . losartan  50 mg Oral Daily  . mometasone-formoterol  2 puff Inhalation BID  . pantoprazole  40 mg Oral Daily  . prednisoLONE  5 mg Oral Daily  . sodium chloride flush  3 mL Intravenous Q12H  . sodium chloride flush  3 mL Intravenous Q12H   Continuous Infusions: . sodium chloride    . sodium chloride    . sodium chloride 75 mL/hr at 03/11/17 0550   PRN Meds: sodium chloride, sodium chloride, acetaminophen, albuterol, nitroGLYCERIN, ondansetron (ZOFRAN) IV, sodium chloride flush, sodium chloride flush, traMADol   Vital Signs    Vitals:   03/10/17 1054 03/10/17 1250 03/10/17 2004 03/11/17 0558  BP: (!) 174/70 (!) 117/56 (!) 124/41 (!) 136/55  Pulse: 83 73 67 72  Resp:  18 18 17   Temp:  97.4 F (36.3 C) 98 F (36.7 C) 97.9 F (36.6 C)  TempSrc:  Oral Oral Oral  SpO2:  100% 99% 100%  Weight:    113 kg (249 lb 1.6 oz)  Height:        Intake/Output Summary (Last 24 hours) at 03/11/17 0757 Last data filed at 03/11/17 0600  Gross per 24 hour  Intake              600 ml  Output             1500 ml  Net             -900 ml   Filed Weights   03/09/17 1556 03/09/17 2015 03/11/17 0558  Weight: 113.4 kg (250 lb) 113.9 kg (251 lb) 113 kg (249 lb 1.6 oz)    Telemetry     Sinus- Personally Reviewed   Physical Exam   GEN: No acute distress.  Obese Neck: Supple Cardiac: RRR Respiratory: Clear to auscultation bilaterally. GI: Soft, nontender, non-distended  MS: No edema Neuro:  Nonfocal  Psych: Normal affect   Labs    Chemistry Recent Labs Lab 03/09/17 1556 03/10/17 0750  NA 137 140  K 4.5 4.4  CL 101 102  CO2 27 30  GLUCOSE 201* 92  BUN 21* 21*  CREATININE 1.33* 1.26*  CALCIUM 9.5 9.5  GFRNONAA 43* 46*  GFRAA 50* 54*  ANIONGAP 9 8     Hematology Recent Labs Lab 03/09/17 1556 03/10/17 0750  WBC 13.9* 12.2*  RBC 3.72* 3.68*  HGB 10.3* 10.0*  HCT 32.4* 32.4*  MCV 87.1 88.0  MCH 27.7 27.2  MCHC 31.8 30.9  RDW 14.9 15.0  PLT 438* 370    Cardiac Enzymes Recent Labs Lab 03/09/17 2030 03/10/17 0204 03/10/17 0750  TROPONINI <0.03 <0.03 <0.03  Recent Labs Lab 03/09/17 1609  TROPIPOC 0.00     BNP Recent Labs Lab 03/09/17 1556  BNP 47.6      Radiology    Dg Chest 2 View  Result Date: 03/09/2017 CLINICAL DATA:  Central left-sided chest pain with left arm numbness, tingling, and shortness of breath since last night. Nausea. History of sarcoidosis. EXAM: CHEST  2 VIEW COMPARISON:  12/03/2015 FINDINGS: The cardiac silhouette appears slightly enlarged. No airspace consolidation, edema, pleural effusion, or pneumothorax is identified. Mild chronic bronchitic changes are similar to the prior study. Upper abdominal surgical clips are noted. No acute osseous abnormality is seen. IMPRESSION: No active cardiopulmonary disease. Electronically Signed   By: Logan Bores M.D.   On: 03/09/2017 16:31   Nm Myocar Multi W/spect W/wall Motion / Ef  Result Date: 03/10/2017 CLINICAL DATA:  Chest pain EXAM: MYOCARDIAL IMAGING WITH SPECT (REST AND PHARMACOLOGIC-STRESS) GATED LEFT VENTRICULAR WALL MOTION STUDY LEFT VENTRICULAR EJECTION FRACTION TECHNIQUE: Standard myocardial SPECT imaging was performed after resting intravenous  injection of 10 mCi Tc-68m tetrofosmin. Subsequently, intravenous infusion of Lexiscan was performed under the supervision of the Cardiology staff. At peak effect of the drug, 30 mCi Tc-69m tetrofosmin was injected intravenously and standard myocardial SPECT imaging was performed. Quantitative gated imaging was also performed to evaluate left ventricular wall motion, and estimate left ventricular ejection fraction. COMPARISON:  None. FINDINGS: Perfusion: There is a moderate-sized perfusion defect at the apex extending into the lateral wall on stress imaging which re- perfuses on rest imaging. There is also an ill-defined stress-induced perfusion defect within the lateral wall extending for the base to the apex. Wall Motion: Normal left ventricular wall motion. No left ventricular dilation. Left Ventricular Ejection Fraction: 55 % End diastolic volume 168 ml End systolic volume 64 ml IMPRESSION: 1. There is a moderate-sized stress-induced perfusion defect at the apex as well as ill-defined stress-induced perfusion defect in the lateral. 2. Normal left ventricular wall motion. 3. Left ventricular ejection fraction 55% 4. Non invasive risk stratification*: Intermediate *2012 Appropriate Use Criteria for Coronary Revascularization Focused Update: J Am Coll Cardiol. 3729;02(1):115-520. http://content.[email or web address]?[email or web address] Electronically Signed   By: Marybelle Killings M.D.   On: 03/10/2017 13:14    Patient Profile     56 y.o. female with past medical history of diabetes mellitus, hypertension, hyperlipidemia, COPD, chronic diastolic congestive heart failure. Patient admitted with chest pain and ruled out. Nuclear study shows ejection fraction 55% with apical ischemia and lateral ischemia.   Assessment & Plan    1 chest pain-nuclear study as outlined above. Plan definitive evaluation today. She will proceed with cardiac catheterization. The risks and benefits including myocardial infarctions,  CVA and death discussed and she agrees to proceed. She has mild baseline renal insufficiency. Lasix has been held. Resume following procedure. Follow renal function after procedure.  2 chronic diastolic congestive heart failure-diuretics are presently on hold prior to catheterization. Resume after procedure.  3 hypertension-blood pressure controlled. Continue present medications.  4 hyperlipidemia-continue statin.  5 chronic stage III kidney disease-follow renal function after procedure.  Signed,  Kirk Ruths, MD  03/11/2017, 7:57 AM

## 2017-03-11 NOTE — Progress Notes (Addendum)
PROGRESS NOTE    [name]  [name]:[id number] DOB: Mar 18, 1961 DOA: 03/09/2017 PCP: [name], MD   Chief Complaint  Patient presents with  . Chest Pain    Brief Narrative:  HPI On 03/09/2017 by Dr. Karmen Bongo [name] is a 56 y.o. female with medical history significant of gastric bypass; pulmonary sarcoidosis; HTN; HLD; MRSA wound infection; DM; COPD; and chronic diastolic heart failure (Echo 11/15 showed preserved EF and grade 1 diastolic dysfunction) presenting with chest pain.  Last night she developed substernal chest pain into the back.  Not bad last night but worse today with sweating, radiation down left arm, tingling in fingers, N/V.  Symptoms started worsening about 2pm.  Worse with exertion.  Better with rest and NTG.  H/o catheterization very remotely, last stress test was in 2014. Assessment & Plan   Chest pain -With typical and atypical features -Improved with nitroglycerin -Cardiology consult and appreciated -Troponin cycled, currently negative -Lexiscan showed EF 55%, intermediate risk stratification -Continue aspirin, statin, Coreg -Plan for cardiac catheterization today  Essential Hypertension -Continue Coreg, Lasix, Cardizem, losartan  Chronic diastolic heart failure -Echocardiogram 09/02/2014 showed a grade 1 diastolic dysfunction, EF of 55-60% -Lasix held today due to heart cath -Monitor intake and output, daily weights -Currently appears euvolemic -BNP 47.6  Hyperlipidemia -Continue statin, TriCor -Lipid panel: total cholesterol 163, HDL 35, LDL 49, triglycerides 395  Diabetes mellitus, type II with neuropathy -Continue Lantus, insulin by scale CBG monitoring -Glyburide, metformin held (patient also takes Toujeo and Novolog at home) -Continue gabapentin -hemoglobin A1c 8.9  Depression/anxiety -Effexor held -Continue Paroxetine and Wellbutrin -Not sure if patient should be on SSRI and 2 SNRIs. Patient will need to discuss  these medications with her primary care physician or psychiatrist upon discharge  Chronic kidney disease, stage III -Creatinine appears to be at baseline, currently 1.10 -Continue to monitor BMP  Chronic normocytic Anemia -Hemoglobin currently 10, appears to be at baseline -Continue to monitor CBC  Sarcoidosis -Continue prednisolone  GERD -Continue PPI  DVT Prophylaxis  Lovenox  Code Status: Full  Family Communication: Son at bedside  Disposition Plan: Observation. Pending heart cath today. Home when stable.  Consultants Cardiology  Procedures  Lexiscan  Antibiotics   Anti-infectives    None      Subjective:   [name] seen and examined today.  Patient denies further chest pain, shortness of breath. Did feel nausea last night. Currently anxious about heart cath today. Denies abdominal pain, vomiting, diarrhea, constipation.   Objective:   Vitals:   03/10/17 1054 03/10/17 1250 03/10/17 2004 03/11/17 0558  BP: (!) 174/70 (!) 117/56 (!) 124/41 (!) 136/55  Pulse: 83 73 67 72  Resp:  18 18 17   Temp:  97.4 F (36.3 C) 98 F (36.7 C) 97.9 F (36.6 C)  TempSrc:  Oral Oral Oral  SpO2:  100% 99% 100%  Weight:    113 kg (249 lb 1.6 oz)  Height:        Intake/Output Summary (Last 24 hours) at 03/11/17 1248 Last data filed at 03/11/17 1200  Gross per 24 hour  Intake           1062.5 ml  Output             1500 ml  Net           -437.5 ml   Filed Weights   03/09/17 1556 03/09/17 2015 03/11/17 0558  Weight: 113.4 kg (250 lb) 113.9 kg (251 lb)  113 kg (249 lb 1.6 oz)   Exam  General: Well developed, well nourished, No distress  HEENT: NCAT, mucous membranes moist.   Cardiovascular: S1 S2 auscultated, no rubs, murmurs or gallops. Regular rate and rhythm.  Respiratory: Clear to auscultation bilaterally with equal chest rise  Abdomen: Soft, obese, nontender, nondistended, + bowel sounds  Extremities: warm dry without cyanosis clubbing or  edema  Neuro: AAOx3, nonfocal  Psych: Anxious, but appropriate  Data Reviewed: I have personally reviewed following labs and imaging studies  CBC:  Recent Labs Lab 03/09/17 1556 03/10/17 0750  WBC 13.9* 12.2*  HGB 10.3* 10.0*  HCT 32.4* 32.4*  MCV 87.1 88.0  PLT 438* 426   Basic Metabolic Panel:  Recent Labs Lab 03/09/17 1556 03/10/17 0750 03/11/17 0943  NA 137 140 139  K 4.5 4.4 3.8  CL 101 102 103  CO2 27 30 27   GLUCOSE 201* 92 108*  BUN 21* 21* 20  CREATININE 1.33* 1.26* 1.10*  CALCIUM 9.5 9.5 9.0   GFR: Estimated Creatinine Clearance: 72 mL/min (A) (by C-G formula based on SCr of 1.1 mg/dL (H)). Liver Function Tests: No results for input(s): AST, ALT, ALKPHOS, BILITOT, PROT, ALBUMIN in the last 168 hours. No results for input(s): LIPASE, AMYLASE in the last 168 hours. No results for input(s): AMMONIA in the last 168 hours. Coagulation Profile:  Recent Labs Lab 03/10/17 0750  INR 1.04   Cardiac Enzymes:  Recent Labs Lab 03/09/17 2030 03/10/17 0204 03/10/17 0750  TROPONINI <0.03 <0.03 <0.03   BNP (last 3 results) No results for input(s): PROBNP in the last 8760 hours. HbA1C:  Recent Labs  03/09/17 2030  HGBA1C 8.9*   CBG:  Recent Labs Lab 03/10/17 1145 03/10/17 1639 03/10/17 2116 03/11/17 0619 03/11/17 1143  GLUCAP 103* 342* 325* 194* 81   Lipid Profile:  Recent Labs  03/10/17 0204  CHOL 163  HDL 35*  LDLCALC 49  TRIG 395*  CHOLHDL 4.7   Thyroid Function Tests:  Recent Labs  03/09/17 2030  TSH 2.665   Anemia Panel: No results for input(s): VITAMINB12, FOLATE, FERRITIN, TIBC, IRON, RETICCTPCT in the last 72 hours. Urine analysis:    Component Value Date/Time   COLORURINE AMBER (A) 10/04/2015 1444   APPEARANCEUR CLEAR 10/04/2015 1444   LABSPEC 1.021 10/04/2015 1444   PHURINE 5.0 10/04/2015 1444   GLUCOSEU NEGATIVE 10/04/2015 1444   HGBUR NEGATIVE 10/04/2015 1444   HGBUR negative 10/20/2010 0814   BILIRUBINUR n  11/12/2015 1535   KETONESUR NEGATIVE 10/04/2015 1444   PROTEINUR n 11/12/2015 1535   PROTEINUR NEGATIVE 10/04/2015 1444   UROBILINOGEN 0.2 11/12/2015 1535   UROBILINOGEN 0.2 04/27/2015 1226   NITRITE n 11/12/2015 1535   NITRITE NEGATIVE 10/04/2015 1444   LEUKOCYTESUR small (1+) (A) 11/12/2015 1535   Sepsis Labs: @LABRCNTIP (procalcitonin:4,lacticidven:4)  ) Recent Results (from the past 240 hour(s))  MRSA PCR Screening     Status: None   Collection Time: 03/09/17  8:29 PM  Result Value Ref Range Status   MRSA by PCR NEGATIVE NEGATIVE Final    Comment:        The GeneXpert MRSA Assay (FDA approved for NASAL specimens only), is one component of a comprehensive MRSA colonization surveillance program. It is not intended to diagnose MRSA infection nor to guide or monitor treatment for MRSA infections.       Radiology Studies: Dg Chest 2 View  Result Date: 03/09/2017 CLINICAL DATA:  Central left-sided chest pain with left arm numbness, tingling, and  shortness of breath since last night. Nausea. History of sarcoidosis. EXAM: CHEST  2 VIEW COMPARISON:  12/03/2015 FINDINGS: The cardiac silhouette appears slightly enlarged. No airspace consolidation, edema, pleural effusion, or pneumothorax is identified. Mild chronic bronchitic changes are similar to the prior study. Upper abdominal surgical clips are noted. No acute osseous abnormality is seen. IMPRESSION: No active cardiopulmonary disease. Electronically Signed   By: Logan Bores M.D.   On: 03/09/2017 16:31   Nm Myocar Multi W/spect W/wall Motion / Ef  Result Date: 03/10/2017 CLINICAL DATA:  Chest pain EXAM: MYOCARDIAL IMAGING WITH SPECT (REST AND PHARMACOLOGIC-STRESS) GATED LEFT VENTRICULAR WALL MOTION STUDY LEFT VENTRICULAR EJECTION FRACTION TECHNIQUE: Standard myocardial SPECT imaging was performed after resting intravenous injection of 10 mCi Tc-62m tetrofosmin. Subsequently, intravenous infusion of Lexiscan was performed under  the supervision of the Cardiology staff. At peak effect of the drug, 30 mCi Tc-32m tetrofosmin was injected intravenously and standard myocardial SPECT imaging was performed. Quantitative gated imaging was also performed to evaluate left ventricular wall motion, and estimate left ventricular ejection fraction. COMPARISON:  None. FINDINGS: Perfusion: There is a moderate-sized perfusion defect at the apex extending into the lateral wall on stress imaging which re- perfuses on rest imaging. There is also an ill-defined stress-induced perfusion defect within the lateral wall extending for the base to the apex. Wall Motion: Normal left ventricular wall motion. No left ventricular dilation. Left Ventricular Ejection Fraction: 55 % End diastolic volume 250 ml End systolic volume 64 ml IMPRESSION: 1. There is a moderate-sized stress-induced perfusion defect at the apex as well as ill-defined stress-induced perfusion defect in the lateral. 2. Normal left ventricular wall motion. 3. Left ventricular ejection fraction 55% 4. Non invasive risk stratification*: Intermediate *2012 Appropriate Use Criteria for Coronary Revascularization Focused Update: J Am Coll Cardiol. 0370;48(8):891-694. http://[email or web address] Electronically Signed   By: Marybelle Killings M.D.   On: 03/10/2017 13:14     Scheduled Meds: . allopurinol  100 mg Oral QHS  . aspirin EC  81 mg Oral Daily  . atorvastatin  40 mg Oral q1800  . buPROPion  300 mg Oral Daily  . carvedilol  25 mg Oral BID WC  . diltiazem  120 mg Oral Daily  . enoxaparin (LOVENOX) injection  40 mg Subcutaneous Q24H  . fenofibrate  160 mg Oral Daily  . fluticasone  2 spray Each Nare Daily  . gabapentin  100 mg Oral TID  . insulin aspart  0-15 Units Subcutaneous TID WC  . insulin aspart  0-5 Units Subcutaneous QHS  . insulin glargine  45 Units Subcutaneous QHS  . losartan  50 mg Oral Daily  . mometasone-formoterol  2 puff Inhalation BID  .  pantoprazole  40 mg Oral Daily  . prednisoLONE  5 mg Oral Daily  . sodium chloride flush  3 mL Intravenous Q12H  . sodium chloride flush  3 mL Intravenous Q12H   Continuous Infusions: . sodium chloride    . sodium chloride    . sodium chloride 75 mL/hr at 03/11/17 0550     LOS: 0 days   Time Spent in minutes   30 minutes  Klohe Lovering D.O. on 03/11/2017 at 12:48 PM  Between 7am to 7pm - Pager - [phone]  After 7pm go to [email or web address] - password [email or web address]  And look for the night coverage person covering for me after hours  [facility] Office  [phone]

## 2017-03-12 DIAGNOSIS — N183 Chronic kidney disease, stage 3 (moderate): Secondary | ICD-10-CM | POA: Diagnosis not present

## 2017-03-12 DIAGNOSIS — R0789 Other chest pain: Secondary | ICD-10-CM | POA: Diagnosis not present

## 2017-03-12 DIAGNOSIS — D860 Sarcoidosis of lung: Secondary | ICD-10-CM | POA: Diagnosis not present

## 2017-03-12 DIAGNOSIS — E1140 Type 2 diabetes mellitus with diabetic neuropathy, unspecified: Secondary | ICD-10-CM | POA: Diagnosis not present

## 2017-03-12 DIAGNOSIS — E7800 Pure hypercholesterolemia, unspecified: Secondary | ICD-10-CM

## 2017-03-12 DIAGNOSIS — E1165 Type 2 diabetes mellitus with hyperglycemia: Secondary | ICD-10-CM | POA: Diagnosis not present

## 2017-03-12 DIAGNOSIS — I10 Essential (primary) hypertension: Secondary | ICD-10-CM

## 2017-03-12 DIAGNOSIS — F339 Major depressive disorder, recurrent, unspecified: Secondary | ICD-10-CM | POA: Diagnosis not present

## 2017-03-12 DIAGNOSIS — Z794 Long term (current) use of insulin: Secondary | ICD-10-CM | POA: Diagnosis not present

## 2017-03-12 DIAGNOSIS — D508 Other iron deficiency anemias: Secondary | ICD-10-CM | POA: Diagnosis not present

## 2017-03-12 DIAGNOSIS — R079 Chest pain, unspecified: Secondary | ICD-10-CM | POA: Diagnosis not present

## 2017-03-12 LAB — BASIC METABOLIC PANEL
Anion gap: 9 (ref 5–15)
BUN: 18 mg/dL (ref 6–20)
CO2: 25 mmol/L (ref 22–32)
Calcium: 8.7 mg/dL — ABNORMAL LOW (ref 8.9–10.3)
Chloride: 101 mmol/L (ref 101–111)
Creatinine, Ser: 1.16 mg/dL — ABNORMAL HIGH (ref 0.44–1.00)
GFR calc Af Amer: 59 mL/min — ABNORMAL LOW (ref >60)
GFR calc non Af Amer: 51 mL/min — ABNORMAL LOW (ref >60)
Glucose, Bld: 237 mg/dL — ABNORMAL HIGH (ref 65–99)
Potassium: 3.8 mmol/L (ref 3.5–5.1)
Sodium: 135 mmol/L (ref 135–145)

## 2017-03-12 LAB — GLUCOSE, CAPILLARY: Glucose-Capillary: 200 mg/dL — ABNORMAL HIGH (ref 65–99)

## 2017-03-12 MED ORDER — NITROGLYCERIN 0.4 MG SL SUBL: 0.4000 mg | SUBLINGUAL_TABLET | SUBLINGUAL | 0 refills | Status: DC | PRN

## 2017-03-12 NOTE — Discharge Instructions (Signed)
Nonspecific Chest Pain °Chest pain can be caused by many different conditions. There is always a chance that your pain could be related to something serious, such as a heart attack or a blood clot in your lungs. Chest pain can also be caused by conditions that are not life-threatening. If you have chest pain, it is very important to follow up with your health care provider. °What are the causes? °Causes of this condition include: °· Heartburn. °· Pneumonia or bronchitis. °· Anxiety or stress. °· Inflammation around your heart (pericarditis) or lung (pleuritis or pleurisy). °· A blood clot in your lung. °· A collapsed lung (pneumothorax). This can develop suddenly on its own (spontaneous pneumothorax) or from trauma to the chest. °· Shingles infection (varicella-zoster virus). °· Heart attack. °· Damage to the bones, muscles, and cartilage that make up your chest wall. This can include: °? Bruised bones due to injury. °? Strained muscles or cartilage due to frequent or repeated coughing or overwork. °? Fracture to one or more ribs. °? Sore cartilage due to inflammation (costochondritis). ° °What increases the risk? °Risk factors for this condition may include: °· Activities that increase your risk for trauma or injury to your chest. °· Respiratory infections or conditions that cause frequent coughing. °· Medical conditions or overeating that can cause heartburn. °· Heart disease or family history of heart disease. °· Conditions or health behaviors that increase your risk of developing a blood clot. °· Having had chicken pox (varicella zoster). ° °What are the signs or symptoms? °Chest pain can feel like: °· Burning or tingling on the surface of your chest or deep in your chest. °· Crushing, pressure, aching, or squeezing pain. °· Dull or sharp pain that is worse when you move, cough, or take a deep breath. °· Pain that is also felt in your back, neck, shoulder, or arm, or pain that spreads to any of these  areas. ° °Your chest pain may come and go, or it may stay constant. °How is this diagnosed? °Lab tests or other studies may be needed to find the cause of your pain. Your health care provider may have you take a test called an ECG (electrocardiogram). An ECG records your heartbeat patterns at the time the test is performed. You may also have other tests, such as: °· Transthoracic echocardiogram (TTE). In this test, sound waves are used to create a picture of the heart structures and to look at how blood flows through your heart. °· Transesophageal echocardiogram (TEE). This is a more advanced imaging test that takes images from inside your body. It allows your health care provider to see your heart in finer detail. °· Cardiac monitoring. This allows your health care provider to monitor your heart rate and rhythm in real time. °· Holter monitor. This is a portable device that records your heartbeat and can help to diagnose abnormal heartbeats. It allows your health care provider to track your heart activity for several days, if needed. °· Stress tests. These can be done through exercise or by taking medicine that makes your heart beat more quickly. °· Blood tests. °· Other imaging tests. ° °How is this treated? °Treatment depends on what is causing your chest pain. Treatment may include: °· Medicines. These may include: °? Acid blockers for heartburn. °? Anti-inflammatory medicine. °? Pain medicine for inflammatory conditions. °? Antibiotic medicine, if an infection is present. °? Medicines to dissolve blood clots. °? Medicines to treat coronary artery disease (CAD). °· Supportive care for conditions that   do not require medicines. This may include: °? Resting. °? Applying heat or cold packs to injured areas. °? Limiting activities until pain decreases. ° °Follow these instructions at home: °Medicines °· If you were prescribed an antibiotic, take it as told by your health care provider. Do not stop taking the  antibiotic even if you start to feel better. °· Take over-the-counter and prescription medicines only as told by your health care provider. °Lifestyle °· Do not use any products that contain nicotine or tobacco, such as cigarettes and e-cigarettes. If you need help quitting, ask your health care provider. °· Do not drink alcohol. °· Make lifestyle changes as directed by your health care provider. These may include: °? Getting regular exercise. Ask your health care provider to suggest some activities that are safe for you. °? Eating a heart-healthy diet. A registered dietitian can help you to learn healthy eating options. °? Maintaining a healthy weight. °? Managing diabetes, if necessary. °? Reducing stress, such as with yoga or relaxation techniques. °General instructions °· Avoid any activities that bring on chest pain. °· If heartburn is the cause for your chest pain, raise (elevate) the head of your bed about 6 inches (15 cm) by putting blocks under the legs. Sleeping with more pillows does not effectively relieve heartburn because it only changes the position of your head. °· Keep all follow-up visits as told by your health care provider. This is important. This includes any further testing if your chest pain does not go away. °Contact a health care provider if: °· Your chest pain does not go away. °· You have a rash with blisters on your chest. °· You have a fever. °· You have chills. °Get help right away if: °· Your chest pain is worse. °· You have a cough that gets worse, or you cough up blood. °· You have severe pain in your abdomen. °· You have severe weakness. °· You faint. °· You have sudden, unexplained chest discomfort. °· You have sudden, unexplained discomfort in your arms, back, neck, or jaw. °· You have shortness of breath at any time. °· You suddenly start to sweat, or your skin gets clammy. °· You feel nauseous or you vomit. °· You suddenly feel light-headed or dizzy. °· Your heart begins to beat  quickly, or it feels like it is skipping beats. °These symptoms may represent a serious problem that is an emergency. Do not wait to see if the symptoms will go away. Get medical help right away. Call your local emergency services (911 in the [place] Do not drive yourself to the hospital. °This information is not intended to replace advice given to you by your health care provider. Make sure you discuss any questions you have with your health care provider. °Document Released: 07/07/2005 Document Revised: 06/21/2016 Document Reviewed: 06/21/2016 °[employer]vier Interactive Patient Education © 2017 [employer] ° °

## 2017-03-12 NOTE — Progress Notes (Signed)
Progress Note  Patient Name: [name] Date of Encounter: 03/12/2017  Primary Cardiologist: Dr Ellyn Hack  Subjective   Denies any chest pain or SOB  Inpatient Medications    Scheduled Meds: . allopurinol  100 mg Oral QHS  . aspirin EC  81 mg Oral Daily  . atorvastatin  40 mg Oral q1800  . buPROPion  300 mg Oral Daily  . carvedilol  25 mg Oral BID WC  . diltiazem  120 mg Oral Daily  . enoxaparin (LOVENOX) injection  40 mg Subcutaneous Q24H  . fenofibrate  160 mg Oral Daily  . fluticasone  2 spray Each Nare Daily  . gabapentin  100 mg Oral TID  . insulin aspart  0-15 Units Subcutaneous TID WC  . insulin aspart  0-5 Units Subcutaneous QHS  . insulin glargine  45 Units Subcutaneous QHS  . losartan  50 mg Oral Daily  . mometasone-formoterol  2 puff Inhalation BID  . pantoprazole  40 mg Oral Daily  . prednisoLONE  5 mg Oral Daily  . sodium chloride flush  3 mL Intravenous Q12H  . sodium chloride flush  3 mL Intravenous Q12H   Continuous Infusions: . sodium chloride    . sodium chloride     PRN Meds: sodium chloride, sodium chloride, acetaminophen, albuterol, nitroGLYCERIN, ondansetron (ZOFRAN) IV, sodium chloride flush, sodium chloride flush, traMADol   Vital Signs    Vitals:   03/11/17 1750 03/11/17 1820 03/11/17 2022 03/12/17 0512  BP: 139/66 138/65 (!) 124/50 (!) 133/57  Pulse: 76  76 71  Resp: 14  18 18   Temp:   98 F (36.7 C) 98.1 F (36.7 C)  TempSrc:   Oral Oral  SpO2: 100% 100% 100% 99%  Weight:    250 lb 1.6 oz (113.4 kg)  Height:        Intake/Output Summary (Last 24 hours) at 03/12/17 1028 Last data filed at 03/12/17 0512  Gross per 24 hour  Intake              890 ml  Output              900 ml  Net              -10 ml   Filed Weights   03/09/17 2015 03/11/17 0558 03/12/17 0512  Weight: 251 lb (113.9 kg) 249 lb 1.6 oz (113 kg) 250 lb 1.6 oz (113.4 kg)    Telemetry    Sinus- Personally Reviewed   Physical Exam   GEN: WD, obese,  WN in NAD Neck: no JVD, bruit Cardiac: RRR with no M/R/G Respiratory: CTA bilaterally GI: soft NT ND with active BS MS:no edema Ext:  Right radial cath site stable Neuro:  A&O x 3 Psych: Normal Mood  Labs    Chemistry  Recent Labs Lab 03/10/17 0750 03/11/17 0943 03/12/17 0322  NA 140 139 135  K 4.4 3.8 3.8  CL 102 103 101  CO2 30 27 25   GLUCOSE 92 108* 237*  BUN 21* 20 18  CREATININE 1.26* 1.10* 1.16*  CALCIUM 9.5 9.0 8.7*  GFRNONAA 46* 55* 51*  GFRAA 54* >60 59*  ANIONGAP 8 9 9      Hematology  Recent Labs Lab 03/09/17 1556 03/10/17 0750  WBC 13.9* 12.2*  RBC 3.72* 3.68*  HGB 10.3* 10.0*  HCT 32.4* 32.4*  MCV 87.1 88.0  MCH 27.7 27.2  MCHC 31.8 30.9  RDW 14.9 15.0  PLT 438* 370  Cardiac Enzymes  Recent Labs Lab 03/09/17 2030 03/10/17 0204 03/10/17 0750  TROPONINI <0.03 <0.03 <0.03     Recent Labs Lab 03/09/17 1609  TROPIPOC 0.00     BNP  Recent Labs Lab 03/09/17 1556  BNP 47.6      Radiology    Nm Myocar Multi W/spect W/wall Motion / Ef  Result Date: 03/10/2017 CLINICAL DATA:  Chest pain EXAM: MYOCARDIAL IMAGING WITH SPECT (REST AND PHARMACOLOGIC-STRESS) GATED LEFT VENTRICULAR WALL MOTION STUDY LEFT VENTRICULAR EJECTION FRACTION TECHNIQUE: Standard myocardial SPECT imaging was performed after resting intravenous injection of 10 mCi Tc-60m tetrofosmin. Subsequently, intravenous infusion of Lexiscan was performed under the supervision of the Cardiology staff. At peak effect of the drug, 30 mCi Tc-2m tetrofosmin was injected intravenously and standard myocardial SPECT imaging was performed. Quantitative gated imaging was also performed to evaluate left ventricular wall motion, and estimate left ventricular ejection fraction. COMPARISON:  None. FINDINGS: Perfusion: There is a moderate-sized perfusion defect at the apex extending into the lateral wall on stress imaging which re- perfuses on rest imaging. There is also an ill-defined  stress-induced perfusion defect within the lateral wall extending for the base to the apex. Wall Motion: Normal left ventricular wall motion. No left ventricular dilation. Left Ventricular Ejection Fraction: 55 % End diastolic volume 203 ml End systolic volume 64 ml IMPRESSION: 1. There is a moderate-sized stress-induced perfusion defect at the apex as well as ill-defined stress-induced perfusion defect in the lateral. 2. Normal left ventricular wall motion. 3. Left ventricular ejection fraction 55% 4. Non invasive risk stratification*: Intermediate *2012 Appropriate Use Criteria for Coronary Revascularization Focused Update: J Am Coll Cardiol. 5597;41(6):384-536. [email or web address] Electronically Signed   By: Marybelle Killings M.D.   On: 03/10/2017 13:14    Patient Profile     56 y.o. female with past medical history of diabetes mellitus, hypertension, hyperlipidemia, COPD, chronic diastolic congestive heart failure. Patient admitted with chest pain and ruled out. Nuclear study shows ejection fraction 55% with apical ischemia and lateral ischemia.   Assessment & Plan    1 Chest pain with normal trop x 3 and abnormal nuclear stress test - cath showed essentially normal coronary arteries with minimal nonobstructive luminal irregularity in the LAD.  Normal LVEDP - noncardiac CP  2 Chronic diastolic congestive heart failure - appears euvolemic on exam and LVEDP high normal at cath - restart home dose of Lasix 40mg  BID  3 Hypertension - blood pressure is well controlled.  - continue carvedilol 25mg  BID, Cardizem CD 120mg  daly and Losartan 50mg  daily.    4 Hyperlipidemia - LDL normal at 49 - continue atorvastatin 40mg  daily and fenofibrate 145mg  daily.    5 Chronic stage III kidney disease - creatinine stable post cath at 1.16.    No other recs at this time will sign off.  Call with any questions.    Signed,  Fransico Him, MD  03/12/2017, 10:28 AM

## 2017-03-12 NOTE — Progress Notes (Signed)
Right radial TR Band removed at 2340.  Site on arrival to floor was level 0 and is level 0 after band d/c'd. Clean dry drsg applied.  CSM WNL  Palpable rt radail and ulnar pulses.Post tr band instructions given and pt verbalized understanding.

## 2017-03-12 NOTE — Discharge Summary (Signed)
Physician Discharge Summary  [name] OQH:[id number] DOB: May 16, 1961 DOA: 03/09/2017  PCP: [name] Olp, MD  Admit date: 03/09/2017 Discharge date: 03/12/2017  Time spent: 45 minutes  Recommendations for Outpatient Follow-up:  Patient will be discharged to home.  Patient will need to follow up with primary care provider within one week of discharge, repeat BMP and discuss depression medications.  Follow up with cardiology as needed. Patient should continue medications as prescribed.  Patient should follow a heart healthy/carb modified diet.   Discharge Diagnoses:  Chest pain Essential Hypertension Chronic diastolic heart failure Hyperlipidemia Diabetes mellitus, type II with neuropathy Depression/anxiety Chronic kidney disease, stage III Chronic normocytic Anemia Sarcoidosis GERD  Discharge Condition: Stable  Diet recommendation: heart healthy/carb modified  Filed Weights   03/09/17 2015 03/11/17 0558 03/12/17 0512  Weight: 113.9 kg (251 lb) 113 kg (249 lb 1.6 oz) 113.4 kg (250 lb 1.6 oz)    History of present illness:  On 03/09/2017 by Dr. Dawayne Cirri R Courtneyis a 56 y.o.femalewith medical history significant of gastric bypass; pulmonary sarcoidosis; HTN; HLD; MRSA wound infection; DM; COPD; and chronic diastolic heart failure (Echo 11/15 showed preserved EF and grade 1 diastolic dysfunction) presenting with chest pain. Last night she developedsubsternal chest pain into the back. Not bad last night but worse today with sweating, radiation down left arm, tingling in fingers, N/V. Symptoms started worsening about 2pm. Worse with exertion. Better with rest and NTG. H/o catheterization very remotely, last stress test was in 2014.  Hospital Course:  Chest pain -With typical and atypical features -Improved with nitroglycerin -Cardiology consult and appreciated -Troponin cycled, currently negative -Lexiscan showed EF 55%, intermediate risk  stratification -Continue aspirin, statin, Coreg -Cardiac catheterization showed minimal nonobstructive irregular LAD, otherwise normal -likely noncardiac cause of chest pain- possibly GERD/esophageal etiology (advised patient to see GI) -Will discharge with nitro PRN  Essential Hypertension -Continue Coreg, Lasix, Cardizem, losartan  Chronic diastolic heart failure -Echocardiogram 09/02/2014 showed a grade 1 diastolic dysfunction, EF of 55-60% -Lasix held today due to heart cath- may restart at discharge -Monitor intake and output, daily weights -Currently appears euvolemic -BNP 47.6  Hyperlipidemia -Continue statin, TriCor -Lipid panel: total cholesterol 163, HDL 35, LDL 49, triglycerides 395  Diabetes mellitus, type II with neuropathy -Continue Lantus, insulin by scale CBG monitoring -Glyburide, metformin held (patient also takes Toujeo and Novolog at home) -Continue gabapentin -hemoglobin A1c 8.9 -Metformin to be restarted in a few days (discussed with patient and husband)  Depression/anxiety -Patient on paroxetine, wellbutrin, Effexor  -Not sure if patient should be on SSRI and 2 SNRIs. Patient will need to discuss these medications with her primary care physician or psychiatrist upon discharge  Chronic kidney disease, stage III -Creatinine 1.16, baseline at 1.10 -repeat BMP in one week  Chronic normocytic Anemia -Hemoglobin currently 10, appears to be at baseline  Sarcoidosis -Continue prednisolone  GERD -Continue PPI  Consultants Cardiology  Procedures  [place] Cardiac catheterization   Discharge Exam: Vitals:   03/11/17 2022 03/12/17 0512  BP: (!) 124/50 (!) 133/57  Pulse: 76 71  Resp: 18 18  Temp: 98 F (36.7 C) 98.1 F (36.7 C)   Patient has no further complaints of chest pain. Denies shortness of breath, abdominal pain, N/V/D/C.    General: Well developed, well nourished, NAD, appears stated age  41: NCAT, mucous membranes  moist.  Cardiovascular: S1 S2 auscultated, RRR, no murmurs   Respiratory: Clear to auscultation bilaterally with equal chest rise  Abdomen: Soft, obese,  nontender, nondistended, + bowel sounds  Extremities: warm dry without cyanosis clubbing or edema  Neuro: AAOx3, nonfocal  Psych: Appropriate mood and affect  Discharge Instructions Discharge Instructions    Discharge instructions    Complete by:  As directed    Patient will be discharged to home.  Patient will need to follow up with primary care provider within one week of discharge, repeat BMP and discuss depression medications.  Follow up with cardiology as needed. Patient should continue medications as prescribed.  Patient should follow a heart healthy/carb modified diet.   Restart Metformin on Monday, 03/14/2017.     Current Discharge Medication List    CONTINUE these medications which have CHANGED   Details  nitroGLYCERIN (NITROSTAT) 0.4 MG SL tablet Place 1 tablet (0.4 mg total) under the tongue every 5 (five) minutes as needed for chest pain. Reported on 01/05/2016 Qty: 100 tablet, Refills: 0      CONTINUE these medications which have NOT CHANGED   Details  albuterol (PROVENTIL HFA;VENTOLIN HFA) 108 (90 BASE) MCG/ACT inhaler Inhale 1-2 puffs into the lungs every 6 (six) hours as needed for wheezing or shortness of breath. Qty: 8 g, Refills: 5    allopurinol (ZYLOPRIM) 100 MG tablet Take 100 mg by mouth at bedtime.     aspirin EC 81 MG tablet Take 81 mg by mouth daily.     atorvastatin (LIPITOR) 40 MG tablet TAKE 1 TAB BY MOUTH ONCE DAILY Qty: 90 tablet, Refills: 3    benzonatate (TESSALON) 100 MG capsule Take 100 mg by mouth 3 (three) times daily as needed for cough. Reported on 01/05/2016    buPROPion (WELLBUTRIN XL) 300 MG 24 hr tablet TAKE 1 TABLET (300 MG TOTAL) BY MOUTH DAILY. Qty: 90 tablet, Refills: 3    carvedilol (COREG) 25 MG tablet Take 1 tablet (25 mg total) by mouth 2 (two) times daily with a  meal. Qty: 180 tablet, Refills: 3    chlorpheniramine-HYDROcodone (TUSSIONEX) 10-8 MG/5ML SUER Take 5 mLs by mouth daily as needed for cough. Reported on 01/05/2016 Refills: 0    DEXILANT 60 MG capsule TAKE ONE CAPSULE BY MOUTH EVERY DAY Qty: 30 capsule, Refills: 5    diclofenac sodium (VOLTAREN) 1 % GEL Apply 2 g topically 4 (four) times daily. Qty: 100 g, Refills: 0    diltiazem (CARDIZEM CD) 120 MG 24 hr capsule Take 1 capsule (120 mg total) by mouth daily. Qty: 90 capsule, Refills: 3    estradiol (ESTRACE) 2 MG tablet Take 2 mg by mouth at bedtime.     fenofibrate (TRICOR) 145 MG tablet TAKE 1 TABLET (145 MG TOTAL) BY MOUTH DAILY. Qty: 30 tablet, Refills: 6    fluticasone (FLONASE) 50 MCG/ACT nasal spray Place 2 sprays into both nostrils daily. Qty: 48 g, Refills: 0    furosemide (LASIX) 40 MG tablet TAKE 1 TABLET BY MOUTH TWICE A DAY Qty: 60 tablet, Refills: 11    gabapentin (NEURONTIN) 100 MG capsule TAKE ONE CAPSULE BY MOUTH 3 TIMES A DAY Qty: 90 capsule, Refills: 0    glyBURIDE (DIABETA) 2.5 MG tablet Take 2.5 mg by mouth at bedtime.     insulin aspart (NOVOLOG FLEXPEN) 100 UNIT/ML FlexPen Inject 12-20 Units into the skin 3 (three) times daily with meals. Per sliding scale Qty: 30 mL, Refills: 5   Associated Diagnoses: Uncontrolled type 2 diabetes mellitus with diabetic neuropathy, with long-term current use of insulin (HCC)    Insulin Glargine (TOUJEO SOLOSTAR) 300 UNIT/ML SOPN Inject 45  Units into the skin daily. Qty: 5 pen, Refills: 5    losartan (COZAAR) 50 MG tablet TAKE 1 TABLET (50 MG TOTAL) BY MOUTH DAILY. Refills: 3    metFORMIN (GLUCOPHAGE) 1000 MG tablet TAKE ONE TABLET BY MOUTH TWICE DAILY WITH MEALS Qty: 180 tablet, Refills: 0    mometasone-formoterol (DULERA) 200-5 MCG/ACT AERO Inhale 2 puffs into the lungs 2 (two) times daily. Qty: 13 g, Refills: 5    ondansetron (ZOFRAN-ODT) 4 MG disintegrating tablet Take 4 mg by mouth every 8 (eight) hours as  needed for nausea or vomiting. Reported on 01/05/2016 Refills: 0    orlistat (XENICAL) 120 MG capsule Take 1 capsule (120 mg total) by mouth 3 (three) times daily with meals. Qty: 90 capsule, Refills: 2    Potassium Chloride ER 20 MEQ TBCR TAKE ONE TABLET BY MOUTH THREE TIMES DAILY Qty: 90 tablet, Refills: 3    prednisoLONE 5 MG TABS tablet Take 5 mg by mouth daily.    traMADol (ULTRAM) 50 MG tablet Take 1 tablet (50 mg total) by mouth every 12 (twelve) hours as needed for moderate pain or severe pain. Qty: 30 tablet, Refills: 2    venlafaxine XR (EFFEXOR-XR) 75 MG 24 hr capsule Take 2 capsules (150 mg total) by mouth at bedtime.    vitamin B-12 (CYANOCOBALAMIN) 1000 MCG tablet Take 1,000 mcg by mouth daily.    glucose blood (ONE TOUCH ULTRA TEST) test strip 1 each by Other route 3 (three) times daily. Use as instructed Qty: 100 each, Refills: 5    Insulin Pen Needle 33G X 4 MM MISC 1 each by Does not apply route 4 (four) times daily. Qty: 200 each, Refills: 11   Associated Diagnoses: Uncontrolled type 2 diabetes mellitus with diabetic neuropathy, with long-term current use of insulin (HCC)    LORazepam (ATIVAN) 1 MG tablet     PARoxetine Mesylate (BRISDELLE) 7.5 MG CAPS Take 7.5 mg by mouth daily.       Allergies  Allergen Reactions  . Methotrexate Other (See Comments)    Peri-oral and buccal lesions  . Vancomycin Other (See Comments)    Nephrotoxicity   . Clindamycin/Lincomycin Nausea And Vomiting and Rash  . Chlorhexidine Itching  . Doxycycline Rash  . Lisinopril Cough  . Teflaro [Ceftaroline] Rash   Follow-up Information    [name] Olp, MD. Schedule an appointment as soon as possible for a visit in 1 week(s).   Specialty:  Family Medicine Why:  Hospital follow up Contact information: [address] [place] [address] [phone]            The results of significant diagnostics from this hospitalization (including imaging, microbiology,  ancillary and laboratory) are listed below for reference.    Significant Diagnostic Studies: Dg Chest 2 View  Result Date: 03/09/2017 CLINICAL DATA:  Central left-sided chest pain with left arm numbness, tingling, and shortness of breath since last night. Nausea. History of sarcoidosis. EXAM: CHEST  2 VIEW COMPARISON:  12/03/2015 FINDINGS: The cardiac silhouette appears slightly enlarged. No airspace consolidation, edema, pleural effusion, or pneumothorax is identified. Mild chronic bronchitic changes are similar to the prior study. Upper abdominal surgical clips are noted. No acute osseous abnormality is seen. IMPRESSION: No active cardiopulmonary disease. Electronically Signed   By: Logan Bores M.D.   On: 03/09/2017 16:31   Nm Myocar Multi W/spect W/wall Motion / Ef  Result Date: 03/10/2017 CLINICAL DATA:  Chest pain EXAM: MYOCARDIAL IMAGING WITH SPECT (REST AND PHARMACOLOGIC-STRESS) GATED  LEFT VENTRICULAR WALL MOTION STUDY LEFT VENTRICULAR EJECTION FRACTION TECHNIQUE: Standard myocardial SPECT imaging was performed after resting intravenous injection of 10 mCi Tc-20m tetrofosmin. Subsequently, intravenous infusion of Lexiscan was performed under the supervision of the Cardiology staff. At peak effect of the drug, 30 mCi Tc-45m tetrofosmin was injected intravenously and standard myocardial SPECT imaging was performed. Quantitative gated imaging was also performed to evaluate left ventricular wall motion, and estimate left ventricular ejection fraction. COMPARISON:  None. FINDINGS: Perfusion: There is a moderate-sized perfusion defect at the apex extending into the lateral wall on stress imaging which re- perfuses on rest imaging. There is also an ill-defined stress-induced perfusion defect within the lateral wall extending for the base to the apex. Wall Motion: Normal left ventricular wall motion. No left ventricular dilation. Left Ventricular Ejection Fraction: 55 % End diastolic volume 789 ml End  systolic volume 64 ml IMPRESSION: 1. There is a moderate-sized stress-induced perfusion defect at the apex as well as ill-defined stress-induced perfusion defect in the lateral. 2. Normal left ventricular wall motion. 3. Left ventricular ejection fraction 55% 4. Non invasive risk stratification*: Intermediate *2012 Appropriate Use Criteria for Coronary Revascularization Focused Update: J Am Coll Cardiol. 3810;17(5):102-585. http://content.[email or web address].aspx?articleid=[email or web address] Electronically Signed   By: Marybelle Killings M.D.   On: 03/10/2017 13:14    Microbiology: Recent Results (from the past 240 hour(s))  MRSA PCR Screening     Status: None   Collection Time: 03/09/17  8:29 PM  Result Value Ref Range Status   MRSA by PCR NEGATIVE NEGATIVE Final    Comment:        The GeneXpert MRSA Assay (FDA approved for NASAL specimens only), is one component of a comprehensive MRSA colonization surveillance program. It is not intended to diagnose MRSA infection nor to guide or monitor treatment for MRSA infections.      Labs: Basic Metabolic Panel:  Recent Labs Lab 03/09/17 1556 03/10/17 0750 03/11/17 0943 03/12/17 0322  NA 137 140 139 135  K 4.5 4.4 3.8 3.8  CL 101 102 103 101  CO2 27 30 27 25   GLUCOSE 201* 92 108* 237*  BUN 21* 21* 20 18  CREATININE 1.33* 1.26* 1.10* 1.16*  CALCIUM 9.5 9.5 9.0 8.7*   Liver Function Tests: No results for input(s): AST, ALT, ALKPHOS, BILITOT, PROT, ALBUMIN in the last 168 hours. No results for input(s): LIPASE, AMYLASE in the last 168 hours. No results for input(s): AMMONIA in the last 168 hours. CBC:  Recent Labs Lab 03/09/17 1556 03/10/17 0750  WBC 13.9* 12.2*  HGB 10.3* 10.0*  HCT 32.4* 32.4*  MCV 87.1 88.0  PLT 438* 370   Cardiac Enzymes:  Recent Labs Lab 03/09/17 2030 03/10/17 0204 03/10/17 0750  TROPONINI <0.03 <0.03 <0.03   BNP: BNP (last 3 results)  Recent Labs  03/09/17 1556  BNP 47.6    ProBNP (last 3  results) No results for input(s): PROBNP in the last 8760 hours.  CBG:  Recent Labs Lab 03/11/17 0619 03/11/17 1143 03/11/17 1825 03/11/17 2021 03/12/17 0606  GLUCAP 194* 81 119* 177* 200*       Signed:  Cristal Ford  [facility] 03/12/2017, 10:54 AM

## 2017-03-14 ENCOUNTER — Encounter (HOSPITAL_COMMUNITY): Payer: Self-pay | Admitting: Cardiovascular Disease

## 2017-03-15 ENCOUNTER — Encounter: Payer: Self-pay | Admitting: Family Medicine

## 2017-03-15 ENCOUNTER — Ambulatory Visit (INDEPENDENT_AMBULATORY_CARE_PROVIDER_SITE_OTHER): Payer: BLUE CROSS/BLUE SHIELD | Admitting: Family Medicine

## 2017-03-15 VITALS — BP 142/82 | HR 85 | Temp 98.5°F | Ht 66.0 in | Wt 246.4 lb

## 2017-03-15 DIAGNOSIS — I10 Essential (primary) hypertension: Secondary | ICD-10-CM | POA: Diagnosis not present

## 2017-03-15 DIAGNOSIS — R232 Flushing: Secondary | ICD-10-CM | POA: Diagnosis not present

## 2017-03-15 DIAGNOSIS — R079 Chest pain, unspecified: Secondary | ICD-10-CM | POA: Diagnosis not present

## 2017-03-15 DIAGNOSIS — F339 Major depressive disorder, recurrent, unspecified: Secondary | ICD-10-CM

## 2017-03-15 DIAGNOSIS — N183 Chronic kidney disease, stage 3 unspecified: Secondary | ICD-10-CM

## 2017-03-15 LAB — CBC
HCT: 35.2 % — ABNORMAL LOW (ref 36.0–46.0)
Hemoglobin: 11.5 g/dL — ABNORMAL LOW (ref 12.0–15.0)
MCHC: 32.7 g/dL (ref 30.0–36.0)
MCV: 84.6 fl (ref 78.0–100.0)
Platelets: 494 10*3/uL — ABNORMAL HIGH (ref 150.0–400.0)
RBC: 4.16 Mil/uL (ref 3.87–5.11)
RDW: 15.6 % — ABNORMAL HIGH (ref 11.5–15.5)
WBC: 16.5 10*3/uL — ABNORMAL HIGH (ref 4.0–10.5)

## 2017-03-15 LAB — BASIC METABOLIC PANEL
BUN: 27 mg/dL — ABNORMAL HIGH (ref 6–23)
CO2: 29 mEq/L (ref 19–32)
Calcium: 10.1 mg/dL (ref 8.4–10.5)
Chloride: 96 mEq/L (ref 96–112)
Creatinine, Ser: 1.37 mg/dL — ABNORMAL HIGH (ref 0.40–1.20)
GFR: 42.14 mL/min — ABNORMAL LOW (ref >60.00)
Glucose, Bld: 217 mg/dL — ABNORMAL HIGH (ref 70–99)
Potassium: 4.4 mEq/L (ref 3.5–5.1)
Sodium: 135 mEq/L (ref 135–145)

## 2017-03-15 NOTE — Assessment & Plan Note (Signed)
Hospitalized for chest pain. Chest pain free at this point. Intermediate risk stress test.Ultimately she had a cardiac catheterization which showed minimal nonobstructive disease with some irregularities in the LAD. It was thought that her pain was noncardiac.  She did have relief with nitroglycerin and she has some of this at home now. She will use this if she has recurrence of pain and if this does not improve symptoms will seek care in the hospital. It is possible she is having some microvascular disease contributing to her pain. She declines GI follow-up for potential noncardiac cause. Esophageal spasms certainly in the differential.  She can return to her regular follow-up with Dr. Ellyn Hack at this point.

## 2017-03-15 NOTE — Progress Notes (Signed)
Subjective:  [name] is a 56 y.o. year old very pleasant female patient who presents for/with See problem oriented charting ROS- no fever, chills, nausea. Has not had chest pain since she was hospitalized.  [name] is a 56 year old female with complex medical history including history of pulmonary sarcoidosis, hypertension, hyperlipidemia, diabetes, COPD, chronic diastolic heart failure who presented to the hospital with chest pain. She complained of substernal chest pain radiating to her back that was worse with with exertion and radiated down into her left arm-she also experienced paresthesias. She had drastic improvement after 3 nitroglycerin. Her last stress test and then in 2014. Cardiology thought there were both typical and atypical features and did a stress test on her through a [place] which showed 55% ejection fraction as well as intermediate risk stratification. Ultimately she had a cardiac catheterization which showed minimal nonobstructive disease with some irregularities in the LAD. It was thought that her pain was noncardiac. There was some consideration could be gastrointestinal in nature and there was a discussion about her seeing gastroenterology. Since she has had complete resolution of symptoms she does not want to pursue gastroenterology visit at this time.  She was noted to have a slight worsening in her kidney function. By discharge her creatinine was in the 1.1-1.2 range. Plan was for updating bmp today  For depression-she has been continuing on Wellbutrin 300 mg extended release and Effexor 75 mg twice a day. Hospitalist was concerned about her being on 2 SSRIs. She is on Paxil at a low dose 7.5 mg to help reduce her hot flashes. She was willing to trial off of  Paxil.   Her chronic medical conditions were maintained in the hospital. She was continued on her typical aspirin, statin, Coreg for her coronary artery disease. In addition she was continued on  Cardizem,  losartan for hypertension. Her Lasix was held for her diastolic heart failure but restarted on discharge. Her hyperlipidemia was continued on statin and TriCor-she was noted LDL under 70. For her diabetes she was placed on Lantus and sliding scale insulin. At discharge she was restarted on Toujeo and NovoLog as well as metformin. her A1c was noted to be elevated.     Past Medical History-  Patient Active Problem List   Diagnosis Date Noted  . Obstructive chronic bronchitis without exacerbation ([place]) 09/18/2013    Priority: High  . Chest pain 04/11/2013    Priority: High  . Hypertensive heart disease with chronic diastolic congestive heart failure ([place])     Priority: High  . Sarcoidosis of lung ([place]) 04/10/2007    Priority: High  . Type II diabetes mellitus with neurological manifestations, uncontrolled ([place]) 08/21/2006    Priority: High  . Osteoarthritis of right knee 01/26/2017    Priority: Medium  . CKD (chronic kidney disease) stage 3, GFR 30-59 ml/min 04/27/2015    Priority: Medium  . Fatty liver 09/30/2014    Priority: Medium  . Depression     Priority: Medium  . Gout 08/20/2010    Priority: Medium  . Anemia 09/18/2009    Priority: Medium  . Sleep apnea 04/21/2009    Priority: Medium  . Hyperlipidemia 08/21/2006    Priority: Medium  . Essential hypertension 08/21/2006    Priority: Medium  . Onychomycosis 10/27/2015    Priority: Low  . Hot flashes 07/15/2014    Priority: Low  . Abnormal SPEP 04/17/2014    Priority: Low  . Fracture of left leg 04/17/2014    Priority:  Low  . Cushingoid side effect of steroids (HCC) 04/17/2014    Priority: Low  . Internal hemorrhoids     Priority: Low  . Preoperative clearance 03/25/2014    Priority: Low  . Solitary pulmonary nodule, on CT 02/2013 - stable over 2 years in 2015 02/20/2013    Priority: Low  . Morbid obesity ([place]) 06/04/2009    Priority: Low  . GERD 08/21/2006    Priority: Low  . Acute left ankle pain 01/26/2017   . Multiple allergies 10/14/2016  . Osteomyelitis of left foot ([place]) 05/29/2015  . MRSA (methicillin resistant staph aureus) culture positive 03/27/2015  . Wound infection complicating hardware ([place]) 03/27/2015    Medications- reviewed and updated Current Outpatient Prescriptions  Medication Sig Dispense Refill  . albuterol (PROVENTIL HFA;VENTOLIN HFA) 108 (90 BASE) MCG/ACT inhaler Inhale 1-2 puffs into the lungs every 6 (six) hours as needed for wheezing or shortness of breath. 8 g 5  . allopurinol (ZYLOPRIM) 100 MG tablet Take 100 mg by mouth at bedtime.     [name] aspirin EC 81 MG tablet Take 81 mg by mouth daily.     [name] atorvastatin (LIPITOR) 40 MG tablet TAKE 1 TAB BY MOUTH ONCE DAILY 90 tablet 3  . benzonatate (TESSALON) 100 MG capsule Take 100 mg by mouth 3 (three) times daily as needed for cough. Reported on 01/05/2016    . buPROPion (WELLBUTRIN XL) 300 MG 24 hr tablet TAKE 1 TABLET (300 MG TOTAL) BY MOUTH DAILY. 90 tablet 3  . carvedilol (COREG) 25 MG tablet Take 1 tablet (25 mg total) by mouth 2 (two) times daily with a meal. 180 tablet 3  . chlorpheniramine-HYDROcodone (TUSSIONEX) 10-8 MG/5ML SUER Take 5 mLs by mouth daily as needed for cough. Reported on 01/05/2016  0  . DEXILANT 60 MG capsule TAKE ONE CAPSULE BY MOUTH EVERY DAY 30 capsule 5  . diclofenac sodium (VOLTAREN) 1 % GEL Apply 2 g topically 4 (four) times daily. 100 g 0  . diltiazem (CARDIZEM CD) 120 MG 24 hr capsule Take 1 capsule (120 mg total) by mouth daily. 90 capsule 3  . estradiol (ESTRACE) 2 MG tablet Take 2 mg by mouth at bedtime.     . fenofibrate (TRICOR) 145 MG tablet TAKE 1 TABLET (145 MG TOTAL) BY MOUTH DAILY. 30 tablet 6  . fluticasone (FLONASE) 50 MCG/ACT nasal spray Place 2 sprays into both nostrils daily. 48 g 0  . furosemide (LASIX) 40 MG tablet TAKE 1 TABLET BY MOUTH TWICE A DAY 60 tablet 11  . gabapentin (NEURONTIN) 100 MG capsule TAKE ONE CAPSULE BY MOUTH 3 TIMES A DAY (Patient taking differently: Take 100  mg by mouth in the morning and 200 mg at bedtime) 90 capsule 0  . glucose blood (ONE TOUCH ULTRA TEST) test strip 1 each by Other route 3 (three) times daily. Use as instructed 100 each 5  . glyBURIDE (DIABETA) 2.5 MG tablet Take 2.5 mg by mouth at bedtime.     . insulin aspart (NOVOLOG FLEXPEN) 100 UNIT/ML FlexPen Inject 12-20 Units into the skin 3 (three) times daily with meals. Per sliding scale 30 mL 5  . Insulin Glargine (TOUJEO SOLOSTAR) 300 UNIT/ML SOPN Inject 45 Units into the skin daily. (Patient taking differently: Inject 45 Units into the skin at bedtime. ) 5 pen 5  . Insulin Pen Needle 33G X 4 MM MISC 1 each by Does not apply route 4 (four) times daily. 200 each 11  . LORazepam (ATIVAN) 1  MG tablet     . losartan (COZAAR) 50 MG tablet TAKE 1 TABLET (50 MG TOTAL) BY MOUTH DAILY.  3  . metFORMIN (GLUCOPHAGE) 1000 MG tablet TAKE ONE TABLET BY MOUTH TWICE DAILY WITH MEALS 180 tablet 0  . mometasone-formoterol (DULERA) 200-5 MCG/ACT AERO Inhale 2 puffs into the lungs 2 (two) times daily. 13 g 5  . nitroGLYCERIN (NITROSTAT) 0.4 MG SL tablet Place 1 tablet (0.4 mg total) under the tongue every 5 (five) minutes as needed for chest pain. Reported on 01/05/2016 100 tablet 0  . ondansetron (ZOFRAN-ODT) 4 MG disintegrating tablet Take 4 mg by mouth every 8 (eight) hours as needed for nausea or vomiting. Reported on 01/05/2016  0  . orlistat (XENICAL) 120 MG capsule Take 1 capsule (120 mg total) by mouth 3 (three) times daily with meals. 90 capsule 2  . PARoxetine Mesylate (BRISDELLE) 7.5 MG CAPS Take 7.5 mg by mouth daily.    . Potassium Chloride ER 20 MEQ TBCR TAKE ONE TABLET BY MOUTH THREE TIMES DAILY (Patient taking differently: Take 30 mEq by mouth two times a day) 90 tablet 3  . prednisoLONE 5 MG TABS tablet Take 5 mg by mouth daily.    . traMADol (ULTRAM) 50 MG tablet Take 1 tablet (50 mg total) by mouth every 12 (twelve) hours as needed for moderate pain or severe pain. 30 tablet 2  .  venlafaxine XR (EFFEXOR-XR) 75 MG 24 hr capsule Take 2 capsules (150 mg total) by mouth at bedtime.    . vitamin B-12 (CYANOCOBALAMIN) 1000 MCG tablet Take 1,000 mcg by mouth daily.     No current facility-administered medications for this visit.     Objective: BP (!) 142/82   Pulse 85   Temp 98.5 F (36.9 C) (Oral)   Ht 5\' 6"  (1.676 m)   Wt 246 lb 6.4 oz (111.8 kg)   SpO2 97%   BMI 39.77 kg/m  Gen: NAD, resting comfortably CV: RRR no murmurs rubs or gallops No chest wall pain Lungs: CTAB no crackles, wheeze, rhonchi Abdomen: soft/nontender/nondistended/normal bowel sounds. Morbid obesity with cushingoid features Ext: no edema Skin: warm, dry Neuro: grossly normal, moves all extremities  Assessment/Plan:  Essential hypertension - Plan: CBC, Basic metabolic panel  CKD (chronic kidney disease) stage 3, GFR 30-59 ml/min Updated the bmet does in fact show chronic kidney disease stage III. Her creatinine is in the 1.3-1.4 range after restarting home medications including Lasix but this is necessary to maintain her use euvolemia. We will continue to monitor every 6 months. I will ask her to come back in 2 weeks for repeat kidney function tests  Depression For depression-she has been continuing on Wellbutrin 300 mg extended release and Effexor 75 mg twice a day. Hospitalist was concerned about her being on 2 SSRIs. She is on Paxil at a low dose 7.5 mg to help reduce her hot flashes. She was willing to trial off of  Paxil.   Hot flashes Trial of Paxil to see she can tolerate this. She thinks she has been on at least 5 years  Chest pain Hospitalized for chest pain. Chest pain free at this point. Intermediate risk stress test.Ultimately she had a cardiac catheterization which showed minimal nonobstructive disease with some irregularities in the LAD. It was thought that her pain was noncardiac.  She did have relief with nitroglycerin and she has some of this at home now. She will use  this if she has recurrence of pain and if this  does not improve symptoms will seek care in the hospital. It is possible she is having some microvascular disease contributing to her pain. She declines GI follow-up for potential noncardiac cause. Esophageal spasms certainly in the differential.  She can return to her regular follow-up with Dr. Ellyn Hack at this point.  Orders Placed This Encounter  Procedures  . CBC    [place]  . Basic metabolic panel    [place]   Return precautions advised.  Garret Reddish, MD

## 2017-03-15 NOTE — Assessment & Plan Note (Signed)
For depression-she has been continuing on Wellbutrin 300 mg extended release and Effexor 75 mg twice a day. Hospitalist was concerned about her being on 2 SSRIs. She is on Paxil at a low dose 7.5 mg to help reduce her hot flashes. She was willing to trial off of  Paxil.

## 2017-03-15 NOTE — Assessment & Plan Note (Signed)
Trial of Paxil to see she can tolerate this. She thinks she has been on at least 5 years

## 2017-03-15 NOTE — Assessment & Plan Note (Signed)
Updated the bmet does in fact show chronic kidney disease stage III. Her creatinine is in the 1.3-1.4 range after restarting home medications including Lasix but this is necessary to maintain her use euvolemia. We will continue to monitor every 6 months. I will ask her to come back in 2 weeks for repeat kidney function tests

## 2017-03-15 NOTE — Patient Instructions (Signed)
Please stop by lab before you go  Trial off paxil at night. Let me know if you have significant recurrence of hot flashes- we would consider restarting.  Ok to use nitroglycerin for chest pain- if does not respond significantly by 2nd dose- still think you should seek care.

## 2017-03-15 NOTE — Assessment & Plan Note (Signed)
Very mild elevation in blood pressure today. Patient is present with her twin grandsons which appears to be an acute stressor for her. Will monitor

## 2017-03-16 ENCOUNTER — Ambulatory Visit (INDEPENDENT_AMBULATORY_CARE_PROVIDER_SITE_OTHER): Payer: BLUE CROSS/BLUE SHIELD | Admitting: Orthopedic Surgery

## 2017-03-16 ENCOUNTER — Ambulatory Visit (INDEPENDENT_AMBULATORY_CARE_PROVIDER_SITE_OTHER): Payer: Self-pay

## 2017-03-16 ENCOUNTER — Encounter (INDEPENDENT_AMBULATORY_CARE_PROVIDER_SITE_OTHER): Payer: Self-pay | Admitting: Orthopedic Surgery

## 2017-03-16 ENCOUNTER — Telehealth: Payer: Self-pay | Admitting: Family Medicine

## 2017-03-16 VITALS — Ht 66.0 in | Wt 246.0 lb

## 2017-03-16 DIAGNOSIS — M1711 Unilateral primary osteoarthritis, right knee: Secondary | ICD-10-CM

## 2017-03-16 DIAGNOSIS — M25561 Pain in right knee: Secondary | ICD-10-CM | POA: Diagnosis not present

## 2017-03-16 DIAGNOSIS — G8929 Other chronic pain: Secondary | ICD-10-CM | POA: Diagnosis not present

## 2017-03-16 NOTE — Telephone Encounter (Signed)
Pt would like blood work results °

## 2017-03-16 NOTE — Progress Notes (Signed)
Office Visit Note   Patient: [name]           Date of Birth: 14-Oct-1960           MRN: [id number] Visit Date: 03/16/2017              Requested by: [name], MD [place], [place] [id number] PCP: [name], MD  Chief Complaint  Patient presents with  . Right Knee - Pain      HPI: Patient is a 56 year old woman who is seen with chronic right knee pain. She had previously been seen [facility].. She underwent steroid injections she underwent hyaluronic acid injections all without relief. Patient has pain with activities of daily living she is currently on prednisone she does use a CPAP machine for sleep apnea. She has seen infectious disease for her osteomyelitis of the left ankle. Patient previously underwent internal fixation she was then seen by Dr. Ouida Sills in [place] and then followed up with myself for the last debridement. Patient has been asymptomatic with her ankle.  Assessment & Plan: Visit Diagnoses:  1. Chronic pain of right knee   2. Unilateral primary osteoarthritis, right knee     Plan: Patient will follow up with her primary care physician for medical clearance for right total knee arthroplasty. Discussed that she is at increased risk of infection however with her inability to perform her activities of daily living patient states that she would like to proceed with surgery at this time. Discussed risk of DVT risk of infection need for additional surgery prolonged surgical intervention if she develops an infection.  Follow-Up Instructions: Return if symptoms worsen or fail to improve.   Ortho Exam  Patient is alert, oriented, no adenopathy, well-dressed, normal affect, normal respiratory effort. Examination patient has an antalgic gait. She is tender to palpation over the mediolateral joint line collapse and cruciates are stable patellofemoral joint is tender to palpation. She has a mild effusion.  Imaging: Xr Knee  1-2 Views Right  Result Date: 03/16/2017 Two-view radiographs of the right knee shows periarticular bony spurs medially and laterally as well as the patellofemoral joint. Patient has bone-on-bone contact medial joint line subcondylar sclerosis.   Labs: Lab Results  Component Value Date   HGBA1C 8.9 (H) 03/09/2017   HGBA1C 8.7 09/29/2016   HGBA1C 7.5 01/15/2016   ESRSEDRATE 5 01/26/2017   ESRSEDRATE 27 10/14/2016   ESRSEDRATE 38 (H) 01/26/2016   CRP 17.3 (H) 01/26/2017   CRP 24.0 (H) 10/14/2016   CRP 2.5 (H) 01/26/2016   LABURIC 5.1 01/26/2017   REPTSTATUS 10/14/2015 FINAL 10/14/2015   REPTSTATUS 10/16/2015 FINAL 10/14/2015   GRAMSTAIN  10/14/2015    MODERATE WBC PRESENT,BOTH PMN AND MONONUCLEAR FEW SQUAMOUS EPITHELIAL CELLS PRESENT ABUNDANT GRAM POSITIVE COCCI IN PAIRS IN CLUSTERS Performed at [facility]  10/14/2015    NORMAL OROPHARYNGEAL FLORA Performed at [facility] Multiple bacterial morphotypes present, none 11/12/2015   LABORGA predominant. Suggest appropriate recollection if  11/12/2015   LABORGA clinically indicated. 11/12/2015    Orders:  Orders Placed This Encounter  Procedures  . XR Knee 1-2 Views Right   No orders of the defined types were placed in this encounter.    Procedures: No procedures performed  Clinical Data: No additional findings.  ROS:  All other systems negative, except as noted in the HPI. Review of Systems  Objective: Vital Signs: Ht 5\' 6"  (  1.676 m)   Wt 246 lb (111.6 kg)   BMI 39.71 kg/m   Specialty Comments:  No specialty comments available.  PMFS History: Patient Active Problem List   Diagnosis Date Noted  . Acute left ankle pain 01/26/2017  . Osteoarthritis of right knee 01/26/2017  . Multiple allergies 10/14/2016  . Onychomycosis 10/27/2015  . Osteomyelitis of left foot ([place]) 05/29/2015  . CKD (chronic kidney disease) stage 3, GFR 30-59 ml/min 04/27/2015  . MRSA (methicillin  resistant staph aureus) culture positive 03/27/2015  . Wound infection complicating hardware ([place]) 03/27/2015  . Fatty liver 09/30/2014  . Hot flashes 07/15/2014  . Abnormal SPEP 04/17/2014  . Fracture of left leg 04/17/2014  . Cushingoid side effect of steroids ([place]) 04/17/2014  . Internal hemorrhoids   . Depression   . Preoperative clearance 03/25/2014  . Obstructive chronic bronchitis without exacerbation ([place]) 09/18/2013  . Chest pain 04/11/2013  . Hypertensive heart disease with chronic diastolic congestive heart failure ([place])   . Solitary pulmonary nodule, on CT 02/2013 - stable over 2 years in 2015 02/20/2013  . Gout 08/20/2010  . Anemia 09/18/2009  . Morbid obesity ([place]) 06/04/2009  . Sleep apnea 04/21/2009  . Sarcoidosis of lung ([place]) 04/10/2007  . Hyperlipidemia 08/21/2006  . Essential hypertension 08/21/2006  . GERD 08/21/2006  . Type II diabetes mellitus with neurological manifestations, uncontrolled ([place]) 08/21/2006   Past Medical History:  Diagnosis Date  . Abnormal SPEP 04/17/2014  . Acute left ankle pain 01/26/2017  . ANEMIA-UNSPECIFIED 09/18/2009  . Chronic diastolic heart failure, NYHA class 2 (H[facility])    LVEDP roughly 20% by cath  . COPD (chronic obstructive pulmonary disease) ([place])   . Depression   . DIABETES MELLITUS, TYPE II 08/21/2006  . Diabetic osteomyelitis ([place]) 05/29/2015  . Fracture of 5th metatarsal    non union  . GERD 08/21/2006  . GOUT 08/20/2010  . Hx of umbilical hernia repair   . HYPERLIPIDEMIA 08/21/2006  . HYPERTENSION 08/21/2006  . Infection of wound due to methicillin resistant Staphylococcus aureus (MRSA)   . Internal hemorrhoids   . Multiple allergies 10/14/2016  . OBESITY 06/04/2009  . Onychomycosis 10/27/2015  . Osteomyelitis of left foot ([place]) 05/29/2015  . Pulmonary sarcoidosis ([place])    Followed locally by pulmonology, but also by Dr. Casper Harrison at [facility]  . Right knee pain 01/26/2017  . Vocal cord dysfunction   . Wears  partial dentures     Family History  Problem Relation Age of Onset  . Diabetes Father   . Heart attack Father 1  . Coronary artery disease Father   . Heart failure Father   . COPD Mother   . Emphysema Mother   . Asthma Mother   . Heart failure Mother   . Heart attack Maternal Grandfather   . Sarcoidosis Maternal Uncle   . Lung cancer Brother   . Diabetes Brother   . Colon cancer Neg Hx     Past Surgical History:  Procedure Laterality Date  . ABDOMINAL HYSTERECTOMY    . APPENDECTOMY    . BLADDER SUSPENSION  11/11/2011   Procedure: TRANSVAGINAL TAPE (TVT) PROCEDURE;  Surgeon: Olga Millers, MD;  Location: [facility];  Service: Gynecology;  Laterality: N/A;  . CARDIAC CATHETERIZATION  07/2010   LVEF 50-55% WITH VERY MILD GLOBAL HYPOKINESIA; ESSENTIALLY NORMAL CORONARY ARTERIES; NORMAL LV FUNCTION  . CAROTIDS  02/18/11   CAROTID DUPLEX; VERTEBRALS ARE PATENT WITH ANTEGRADE FLOW. ICA/CCA RATIO 1.61 ON RIGHT AND 0.75 ON  LEFT  . CHOLECYSTECTOMY  1984  . CYSTOSCOPY  11/11/2011   Procedure: CYSTOSCOPY;  Surgeon: Olga Millers, MD;  Location: [facility];  Service: Gynecology;  Laterality: N/A;  . DOPPLER ECHOCARDIOGRAPHY  02/12/2013   LV FUNCTION, SIZE NORMAL; MILD CONCENTRIC LVH; EST EF 55-65%; WALL MOTION NORMAL  . FRACTURE SURGERY     foot  . HERNIA REPAIR    . I&D EXTREMITY Left 06/27/2015   Procedure: Partial Excision Left Calcaneus, Place Antibiotic Beads, and Wound VAC;  Surgeon: Newt Minion, MD;  Location: [facility];  Service: Orthopedics;  Laterality: Left;  . KNEE ARTHROSCOPY     right  . LEFT AND RIGHT HEART CATHETERIZATION WITH CORONARY ANGIOGRAM N/A 04/23/2013   Procedure: LEFT AND RIGHT HEART CATHETERIZATION WITH CORONARY ANGIOGRAM;  Surgeon: Leonie Man, MD;  Location: [facility];  Service: Cardiovascular;  Laterality: N/A;  . LEFT HEART CATH AND CORONARY ANGIOGRAPHY N/A 03/11/2017   Procedure: Left Heart Cath and Coronary Angiography;  Surgeon: Sherren Mocha, MD;  Location:  [facility];  Service: Cardiovascular;  Laterality: N/A;  . LexiScan Myoview  03/09/2013   EF 50%; NORMAL MYOCARDIAL PERFUSION STUDY - breast attenuation  . METATARSAL OSTEOTOMY WITH OPEN REDUCTION INTERNAL FIXATION (ORIF) METATARSAL WITH FUSION Left 04/09/2014   Procedure: LEFT FOOT FRACTURE OPEN TREATMENT METATARSAL INCLUDES INTERNAL FIXATION EACH;  Surgeon: Lorn Junes, MD;  Location: [facility];  Service: Orthopedics;  Laterality: Left;  . NISSEN FUNDOPLICATION  8333  . Right and left CARDIAC CATHETERIZATION  04/23/2013   Angiographic normal coronaries; LVEDP 20 mmHg, PCWP 12-14 mmHg, RAP 12 mmHg.; Fick CO/CI 4.9/2.2  . TUBAL LIGATION     with reversal in 1994  . VENTRAL HERNIA REPAIR     Social History   Occupational History  . DISABLED    Social History Main Topics  . Smoking status: Never Smoker  . Smokeless tobacco: Never Used  . Alcohol use No  . Drug use: No  . Sexual activity: Yes    Birth control/ protection: Surgical

## 2017-03-17 ENCOUNTER — Other Ambulatory Visit: Payer: Self-pay

## 2017-03-17 DIAGNOSIS — R7989 Other specified abnormal findings of blood chemistry: Secondary | ICD-10-CM

## 2017-03-17 NOTE — Telephone Encounter (Signed)
Spoke with patient this morning and reviewed results

## 2017-03-21 ENCOUNTER — Telehealth: Payer: Self-pay | Admitting: *Deleted

## 2017-03-21 ENCOUNTER — Encounter: Payer: Self-pay | Admitting: *Deleted

## 2017-03-21 NOTE — Telephone Encounter (Signed)
Letter has been faxed to [facility], [employer], for clearance for physical therapy per Dr. Ellyn Hack. The patient has been made aware and verbalized her understanding,

## 2017-03-22 ENCOUNTER — Ambulatory Visit: Payer: BLUE CROSS/BLUE SHIELD | Admitting: Family Medicine

## 2017-03-24 ENCOUNTER — Other Ambulatory Visit: Payer: Self-pay | Admitting: Internal Medicine

## 2017-03-31 ENCOUNTER — Other Ambulatory Visit: Payer: BLUE CROSS/BLUE SHIELD

## 2017-04-04 ENCOUNTER — Other Ambulatory Visit: Payer: Self-pay | Admitting: Internal Medicine

## 2017-04-04 DIAGNOSIS — M25661 Stiffness of right knee, not elsewhere classified: Secondary | ICD-10-CM | POA: Diagnosis not present

## 2017-04-04 DIAGNOSIS — M25562 Pain in left knee: Secondary | ICD-10-CM | POA: Diagnosis not present

## 2017-04-04 DIAGNOSIS — M25561 Pain in right knee: Secondary | ICD-10-CM | POA: Diagnosis not present

## 2017-04-04 DIAGNOSIS — M6281 Muscle weakness (generalized): Secondary | ICD-10-CM | POA: Diagnosis not present

## 2017-04-05 ENCOUNTER — Other Ambulatory Visit (INDEPENDENT_AMBULATORY_CARE_PROVIDER_SITE_OTHER): Payer: BLUE CROSS/BLUE SHIELD

## 2017-04-05 ENCOUNTER — Other Ambulatory Visit: Payer: Self-pay

## 2017-04-05 DIAGNOSIS — R7989 Other specified abnormal findings of blood chemistry: Secondary | ICD-10-CM

## 2017-04-05 LAB — BASIC METABOLIC PANEL
BUN: 26 mg/dL — ABNORMAL HIGH (ref 6–23)
CO2: 32 mEq/L (ref 19–32)
Calcium: 9.6 mg/dL (ref 8.4–10.5)
Chloride: 98 mEq/L (ref 96–112)
Creatinine, Ser: 1.17 mg/dL (ref 0.40–1.20)
GFR: 50.55 mL/min — ABNORMAL LOW (ref >60.00)
Glucose, Bld: 218 mg/dL — ABNORMAL HIGH (ref 70–99)
Potassium: 4.1 mEq/L (ref 3.5–5.1)
Sodium: 138 mEq/L (ref 135–145)

## 2017-04-05 MED ORDER — GLUCOSE BLOOD VI STRP: 1.0000 | ORAL_STRIP | Freq: Three times a day (TID) | 5 refills | Status: DC

## 2017-04-05 MED ORDER — ONETOUCH DELICA LANCETS FINE MISC: 5 refills | Status: DC

## 2017-04-06 ENCOUNTER — Telehealth: Payer: Self-pay | Admitting: Internal Medicine

## 2017-04-06 ENCOUNTER — Other Ambulatory Visit: Payer: Self-pay

## 2017-04-06 MED ORDER — BAYER MICROLET LANCETS MISC: 5 refills | Status: DC

## 2017-04-06 MED ORDER — BAYER CONTOUR MONITOR W/DEVICE KIT: PACK | 0 refills | Status: DC

## 2017-04-06 MED ORDER — GLUCOSE BLOOD VI STRP: ORAL_STRIP | 5 refills | Status: DC

## 2017-04-06 NOTE — Telephone Encounter (Signed)
[name] overdue for f/u. Ov would be needed to consider clearing.

## 2017-04-06 NOTE — Telephone Encounter (Signed)
Changed from [place] to [employer] as insurance will not cover strips.

## 2017-04-06 NOTE — Telephone Encounter (Signed)
Can you please look over patients chart and advise if she would be cleared for surgery while Dr.Gherghe is out. Thank you!

## 2017-04-06 NOTE — Telephone Encounter (Signed)
Patient needs surgery clearance before being able to get surgery. Dr. Sharol Given needs the clearance for the patient. Notify patient at [phone] once this has been done.

## 2017-04-07 ENCOUNTER — Telehealth: Payer: Self-pay

## 2017-04-07 NOTE — Telephone Encounter (Signed)
Message sent to provider 

## 2017-04-07 NOTE — Telephone Encounter (Signed)
Called patient and advised of note. Patient stated she needed to be seen by the covering doctor to get the clearance for surgery. I advised supervisor who will check with MD and I advised patient I would call her back regarding if it was possible to be seen.   Patient stated if her phone was off to leave a voicemail.

## 2017-04-07 NOTE — Telephone Encounter (Signed)
Noted  

## 2017-04-07 NOTE — Telephone Encounter (Signed)
Dr. Loanne Drilling has approved the pt to be added to his schedule. He has 3 slots available on 7/2.  LM for the pt to call back as soon as she can to take advantage of one of these open slots.

## 2017-04-07 NOTE — Telephone Encounter (Signed)
Did we ever figure out if she could be seen? Thanks!

## 2017-04-08 NOTE — Telephone Encounter (Signed)
Patient has been scheduled for July 2 with Dr. Loanne Drilling at [facility] per [place]. No further action required.

## 2017-04-10 ENCOUNTER — Other Ambulatory Visit: Payer: Self-pay | Admitting: Internal Medicine

## 2017-04-11 ENCOUNTER — Encounter: Payer: Self-pay | Admitting: Endocrinology

## 2017-04-11 ENCOUNTER — Ambulatory Visit (INDEPENDENT_AMBULATORY_CARE_PROVIDER_SITE_OTHER): Payer: BLUE CROSS/BLUE SHIELD | Admitting: Endocrinology

## 2017-04-11 DIAGNOSIS — E1140 Type 2 diabetes mellitus with diabetic neuropathy, unspecified: Secondary | ICD-10-CM | POA: Diagnosis not present

## 2017-04-11 DIAGNOSIS — E1165 Type 2 diabetes mellitus with hyperglycemia: Secondary | ICD-10-CM | POA: Diagnosis not present

## 2017-04-11 DIAGNOSIS — IMO0002 Reserved for concepts with insufficient information to code with codable children: Secondary | ICD-10-CM

## 2017-04-11 DIAGNOSIS — Z794 Long term (current) use of insulin: Secondary | ICD-10-CM | POA: Diagnosis not present

## 2017-04-11 MED ORDER — INSULIN ASPART 100 UNIT/ML FLEXPEN: 18.0000 [IU] | PEN_INJECTOR | Freq: Three times a day (TID) | SUBCUTANEOUS | 5 refills | Status: DC

## 2017-04-11 NOTE — Progress Notes (Signed)
Subjective:    Patient ID: [name], female    DOB: [birth year], 56 y.o.   MRN: [id number]  HPI Pt returns for f/u of diabetes mellitus: DM type: Insulin-requiring type 2 Dx'ed: 4403 Complications: polyneuropathy, renal insuff, and mild CAD Therapy: insulin since, and 2 oral meds.  GDM: never DKA: never Severe hypoglycemia: never Pancreatitis: never Pancreatic imaging: CT (2015) showed fatty atrophy Other: she takes multiple daily injections; she intermittently takes prednisone for sarcoidosis Interval history: she will have right TKR soon.  She says cbg's vary from 90-200's.  It is in general higher as the day goes on.  Past Medical History:  Diagnosis Date  . Abnormal SPEP 04/17/2014  . Acute left ankle pain 01/26/2017  . ANEMIA-UNSPECIFIED 09/18/2009  . Chronic diastolic heart failure, NYHA class 2 (HCC)    LVEDP roughly 20% by cath  . COPD (chronic obstructive pulmonary disease) ([place])   . Depression   . DIABETES MELLITUS, TYPE II 08/21/2006  . Diabetic osteomyelitis ([place]) 05/29/2015  . Fracture of 5th metatarsal    non union  . GERD 08/21/2006  . GOUT 08/20/2010  . Hx of umbilical hernia repair   . HYPERLIPIDEMIA 08/21/2006  . HYPERTENSION 08/21/2006  . Infection of wound due to methicillin resistant Staphylococcus aureus (MRSA)   . Internal hemorrhoids   . Multiple allergies 10/14/2016  . OBESITY 06/04/2009  . Onychomycosis 10/27/2015  . Osteomyelitis of left foot ([place]) 05/29/2015  . Pulmonary sarcoidosis ([place])    Followed locally by pulmonology, but also by Dr. Casper Harrison at [facility]  . Right knee pain 01/26/2017  . Vocal cord dysfunction   . Wears partial dentures     Past Surgical History:  Procedure Laterality Date  . ABDOMINAL HYSTERECTOMY    . APPENDECTOMY    . BLADDER SUSPENSION  11/11/2011   Procedure: TRANSVAGINAL TAPE (TVT) PROCEDURE;  Surgeon: Olga Millers, MD;  Location: [facility];  Service: Gynecology;  Laterality: N/A;  . CARDIAC  CATHETERIZATION  07/2010   LVEF 50-55% WITH VERY MILD GLOBAL HYPOKINESIA; ESSENTIALLY NORMAL CORONARY ARTERIES; NORMAL LV FUNCTION  . CAROTIDS  02/18/11   CAROTID DUPLEX; VERTEBRALS ARE PATENT WITH ANTEGRADE FLOW. ICA/CCA RATIO 1.61 ON RIGHT AND 0.75 ON LEFT  . CHOLECYSTECTOMY  1984  . CYSTOSCOPY  11/11/2011   Procedure: CYSTOSCOPY;  Surgeon: Olga Millers, MD;  Location: [facility];  Service: Gynecology;  Laterality: N/A;  . DOPPLER ECHOCARDIOGRAPHY  02/12/2013   LV FUNCTION, SIZE NORMAL; MILD CONCENTRIC LVH; EST EF 55-65%; WALL MOTION NORMAL  . FRACTURE SURGERY     foot  . HERNIA REPAIR    . I&D EXTREMITY Left 06/27/2015   Procedure: Partial Excision Left Calcaneus, Place Antibiotic Beads, and Wound VAC;  Surgeon: Newt Minion, MD;  Location: [place];  Service: Orthopedics;  Laterality: Left;  . KNEE ARTHROSCOPY     right  . LEFT AND RIGHT HEART CATHETERIZATION WITH CORONARY ANGIOGRAM N/A 04/23/2013   Procedure: LEFT AND RIGHT HEART CATHETERIZATION WITH CORONARY ANGIOGRAM;  Surgeon: Leonie Man, MD;  Location: [facility];  Service: Cardiovascular;  Laterality: N/A;  . LEFT HEART CATH AND CORONARY ANGIOGRAPHY N/A 03/11/2017   Procedure: Left Heart Cath and Coronary Angiography;  Surgeon: Sherren Mocha, MD;  Location: [place] CV [facility];  Service: Cardiovascular;  Laterality: N/A;  . LexiScan Myoview  03/09/2013   EF 50%; NORMAL MYOCARDIAL PERFUSION STUDY - breast attenuation  . METATARSAL OSTEOTOMY WITH OPEN REDUCTION INTERNAL FIXATION (ORIF) METATARSAL WITH FUSION  Left 04/09/2014   Procedure: LEFT FOOT FRACTURE OPEN TREATMENT METATARSAL INCLUDES INTERNAL FIXATION EACH;  Surgeon: Lorn Junes, MD;  Location: [place];  Service: Orthopedics;  Laterality: Left;  . NISSEN FUNDOPLICATION  3664  . Right and left CARDIAC CATHETERIZATION  04/23/2013   Angiographic normal coronaries; LVEDP 20 mmHg, PCWP 12-14 mmHg, RAP 12 mmHg.; Fick CO/CI 4.9/2.2  . TUBAL LIGATION     with  reversal in 1994  . VENTRAL HERNIA REPAIR      Social History   Social History  . Marital status: Married    Spouse name: TULLY BURGO  . Number of children: 2  . Years of education: 12   Occupational History  . DISABLED    Social History Main Topics  . Smoking status: Never Smoker  . Smokeless tobacco: Never Used  . Alcohol use No  . Drug use: No  . Sexual activity: Yes    Birth control/ protection: Surgical   Other Topics Concern  . Not on file   Social History Narrative   Married 1994. 2 sons who both live close and 1 grandson.       Disability due to sarcoidosis. 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 albuterol (PROVENTIL HFA;VENTOLIN HFA) 108 (90 BASE) MCG/ACT inhaler Inhale 1-2 puffs into the lungs every 6 (six) hours as needed for wheezing or shortness of breath. 8 g 5  . allopurinol (ZYLOPRIM) 100 MG tablet Take 100 mg by mouth at bedtime.     [name] aspirin EC 81 MG tablet Take 81 mg by mouth daily.     [name] atorvastatin (LIPITOR) 40 MG tablet TAKE 1 TAB BY MOUTH ONCE DAILY 90 tablet 3  . BAYER MICROLET LANCETS lancets Use as instructed to check sugar 3 times daily 300 each 5  . benzonatate (TESSALON) 100 MG capsule Take 100 mg by mouth 3 (three) times daily as needed for cough. Reported on 01/05/2016    . Blood Glucose Monitoring Suppl (BAYER CONTOUR MONITOR) w/Device KIT Use daily to check sugar 1 kit 0  . buPROPion (WELLBUTRIN XL) 300 MG 24 hr tablet TAKE 1 TABLET (300 MG TOTAL) BY MOUTH DAILY. 90 tablet 3  . carvedilol (COREG) 25 MG tablet Take 1 tablet (25 mg total) by mouth 2 (two) times daily with a meal. 180 tablet 3  . chlorpheniramine-HYDROcodone (TUSSIONEX) 10-8 MG/5ML SUER Take 5 mLs by mouth daily as needed for cough.  Reported on 01/05/2016  0  . DEXILANT 60 MG capsule TAKE ONE CAPSULE BY MOUTH EVERY DAY 30 capsule 5  . diclofenac sodium (VOLTAREN) 1 % GEL Apply 2 g topically 4 (four) times daily. 100 g 0  . diltiazem (CARDIZEM CD) 120 MG 24 hr capsule Take 1 capsule (120 mg total) by mouth daily. 90 capsule 3  . estradiol (ESTRACE) 2 MG tablet Take 2 mg by mouth at bedtime.     . fenofibrate (TRICOR) 145 MG tablet TAKE 1 TABLET (145 MG TOTAL) BY MOUTH DAILY. 30 tablet 6  . fluticasone (FLONASE) 50 MCG/ACT nasal spray Place 2 sprays into both nostrils daily. 48 g 0  . furosemide (LASIX) 40 MG tablet TAKE 1 TABLET BY MOUTH TWICE A DAY 60 tablet 11  . gabapentin (NEURONTIN)  100 MG capsule TAKE ONE CAPSULE BY MOUTH 3 TIMES A DAY 90 capsule 0  . gabapentin (NEURONTIN) 100 MG capsule TAKE ONE CAPSULE BY MOUTH 3 TIMES A DAY 90 capsule 0  . glucose blood (BAYER CONTOUR TEST) test strip Use as instructed to check sugar 3 times daily 300 each 5  . glyBURIDE (DIABETA) 2.5 MG tablet Take 2.5 mg by mouth at bedtime.     . Insulin Glargine (TOUJEO SOLOSTAR) 300 UNIT/ML SOPN Inject 45 Units into the skin daily. (Patient taking differently: Inject 45 Units into the skin at bedtime. ) 5 pen 5  . Insulin Pen Needle 33G X 4 MM MISC 1 each by Does not apply route 4 (four) times daily. 200 each 11  . LORazepam (ATIVAN) 1 MG tablet     . losartan (COZAAR) 50 MG tablet TAKE 1 TABLET (50 MG TOTAL) BY MOUTH DAILY.  3  . metFORMIN (GLUCOPHAGE) 1000 MG tablet TAKE 1 TABLET BY MOUTH TWICE A DAY WITH FOOD 180 tablet 0  . mometasone-formoterol (DULERA) 200-5 MCG/ACT AERO Inhale 2 puffs into the lungs 2 (two) times daily. 13 g 5  . nitroGLYCERIN (NITROSTAT) 0.4 MG SL tablet Place 1 tablet (0.4 mg total) under the tongue every 5 (five) minutes as needed for chest pain. Reported on 01/05/2016 100 tablet 0  . ondansetron (ZOFRAN-ODT) 4 MG disintegrating tablet Take 4 mg by mouth every 8 (eight) hours as needed for nausea or vomiting. Reported on  01/05/2016  0  . orlistat (XENICAL) 120 MG capsule Take 1 capsule (120 mg total) by mouth 3 (three) times daily with meals. 90 capsule 2  . PARoxetine Mesylate (BRISDELLE) 7.5 MG CAPS Take 7.5 mg by mouth daily.    . Potassium Chloride ER 20 MEQ TBCR TAKE ONE TABLET BY MOUTH THREE TIMES DAILY (Patient taking differently: Take 30 mEq by mouth two times a day) 90 tablet 3  . prednisoLONE 5 MG TABS tablet Take 5 mg by mouth daily.    . traMADol (ULTRAM) 50 MG tablet Take 1 tablet (50 mg total) by mouth every 12 (twelve) hours as needed for moderate pain or severe pain. 30 tablet 2  . venlafaxine XR (EFFEXOR-XR) 75 MG 24 hr capsule Take 2 capsules (150 mg total) by mouth at bedtime.    . vitamin B-12 (CYANOCOBALAMIN) 1000 MCG tablet Take 1,000 mcg by mouth daily.     No current facility-administered medications on file prior to visit.     Allergies  Allergen Reactions  . Methotrexate Other (See Comments)    Peri-oral and buccal lesions  . Vancomycin Other (See Comments)    Nephrotoxicity   . Clindamycin/Lincomycin Nausea And Vomiting and Rash  . Chlorhexidine Itching  . Doxycycline Rash  . Lisinopril Cough  . Teflaro [Ceftaroline] Rash    Family History  Problem Relation Age of Onset  . Diabetes Father   . Heart attack Father 51  . Coronary artery disease Father   . Heart failure Father   . COPD Mother   . Emphysema Mother   . Asthma Mother   . Heart failure Mother   . Heart attack Maternal Grandfather   . Sarcoidosis Maternal Uncle   . Lung cancer Brother   . Diabetes Brother   . Colon cancer Neg Hx     BP 132/82   Pulse 77   Ht 5' 6"  (1.676 m)   Wt 253 lb (114.8 kg)   SpO2 98%   BMI 40.84 kg/m  Review of Systems She denies hypoglycemia    Objective:   Physical Exam VITAL SIGNS:  See vs page GENERAL: no distress Pulses: dorsalis pedis intact bilat.   MSK: no deformity of the feet CV: no leg edema Skin:  no ulcer on the feet.  normal color and temp on the  feet.  Old healed surgical scar on the left foot Neuro: sensation is intact to touch on the feet, but decreased from normal Ext: There is bilateral onychomycosis of the toenails   outside test results are reviewed: A1c=8.9%    Assessment & Plan:  Insulin-requiring type 2 DM, with renal insuff: worse.  OA of knees, new to me.  She needs to be cleared for surgery soon.    Patient Instructions  check your blood sugar 3 times a day.  vary the time of day when you check, between before the 3 meals, and at bedtime.  also check if you have symptoms of your blood sugar being too high or too low.  please keep a record of the readings and bring it to your next appointment here (or you can bring the meter itself).  You can write it on any piece of paper.  please call us sooner if your blood sugar goes below 70, or if you have a lot of readings over 200. Please increase the novolog to 18-23 units 3 times a day (just before each meal), and: Please continue the same other diabetes medications.   Please come back for a follow-up appointment in 2 weeks.       Diabetes Mellitus and Food It is important for you to manage your blood sugar (glucose) level. Your blood glucose level can be greatly affected by what you eat. Eating healthier foods in the appropriate amounts throughout the day at about the same time each day will help you control your blood glucose level. It can also help slow or prevent worsening of your diabetes mellitus. Healthy eating may even help you improve the level of your blood pressure and reach or maintain a healthy weight. General recommendations for healthful eating and cooking habits include:  Eating meals and snacks regularly. Avoid going long periods of time without eating to lose weight.  Eating a diet that consists mainly of plant-based foods, such as fruits, vegetables, nuts, legumes, and whole grains.  Using low-heat cooking methods, such as baking, instead of high-heat  cooking methods, such as deep frying.  Work with your dietitian to make sure you understand how to use the Nutrition Facts information on food labels. How can food affect me? Carbohydrates Carbohydrates affect your blood glucose level more than any other type of food. Your dietitian will help you determine how many carbohydrates to eat at each meal and teach you how to count carbohydrates. Counting carbohydrates is important to keep your blood glucose at a healthy level, especially if you are using insulin or taking certain medicines for diabetes mellitus. Alcohol Alcohol can cause sudden decreases in blood glucose (hypoglycemia), especially if you use insulin or take certain medicines for diabetes mellitus. Hypoglycemia can be a life-threatening condition. Symptoms of hypoglycemia (sleepiness, dizziness, and disorientation) are similar to symptoms of having too much alcohol. If your health care provider has given you approval to drink alcohol, do so in moderation and use the following guidelines:  Women should not have more than one drink per day, and men should not have more than two drinks per day. One drink is equal to: ? 12 oz of beer. ?  5 oz of wine. ? 1 oz of hard liquor.  Do not drink on an empty stomach.  Keep yourself hydrated. Have water, diet soda, or unsweetened iced tea.  Regular soda, juice, and other mixers might contain a lot of carbohydrates and should be counted.  What foods are not recommended? As you make food choices, it is important to remember that all foods are not the same. Some foods have fewer nutrients per serving than other foods, even though they might have the same number of calories or carbohydrates. It is difficult to get your body what it needs when you eat foods with fewer nutrients. Examples of foods that you should avoid that are high in calories and carbohydrates but low in nutrients include:  Trans fats (most processed foods list trans fats on the  Nutrition Facts label).  Regular soda.  Juice.  Candy.  Sweets, such as cake, pie, doughnuts, and cookies.  Fried foods.  What foods can I eat? Eat nutrient-rich foods, which will nourish your body and keep you healthy. The food you should eat also will depend on several factors, including:  The calories you need.  The medicines you take.  Your weight.  Your blood glucose level.  Your blood pressure level.  Your cholesterol level.  You should eat a variety of foods, including:  Protein. ? Lean cuts of meat. ? Proteins low in saturated fats, such as fish, egg whites, and beans. Avoid processed meats.  Fruits and vegetables. ? Fruits and vegetables that may help control blood glucose levels, such as apples, mangoes, and yams.  Dairy products. ? Choose fat-free or low-fat dairy products, such as milk, yogurt, and cheese.  Grains, bread, pasta, and rice. ? Choose whole grain products, such as multigrain bread, whole oats, and brown rice. These foods may help control blood pressure.  Fats. ? Foods containing healthful fats, such as nuts, avocado, olive oil, canola oil, and fish.  Does everyone with diabetes mellitus have the same meal plan? Because every person with diabetes mellitus is different, there is not one meal plan that works for everyone. It is very important that you meet with a dietitian who will help you create a meal plan that is just right for you. This information is not intended to replace advice given to you by your health care provider. Make sure you discuss any questions you have with your health care provider. Document Released: 06/24/2005 Document Revised: 03/04/2016 Document Reviewed: 08/24/2013 Elsevier Interactive Patient Education  2017 [employer].

## 2017-04-11 NOTE — Patient Instructions (Addendum)
check your blood sugar 3 times a day.  vary the time of day when you check, between before the 3 meals, and at bedtime.  also check if you have symptoms of your blood sugar being too high or too low.  please keep a record of the readings and bring it to your next appointment here (or you can bring the meter itself).  You can write it on any piece of paper.  please call us sooner if your blood sugar goes below 70, or if you have a lot of readings over 200. Please increase the novolog to 18-23 units 3 times a day (just before each meal), and: Please continue the same other diabetes medications.   Please come back for a follow-up appointment in 2 weeks.       Diabetes Mellitus and Food It is important for you to manage your blood sugar (glucose) level. Your blood glucose level can be greatly affected by what you eat. Eating healthier foods in the appropriate amounts throughout the day at about the same time each day will help you control your blood glucose level. It can also help slow or prevent worsening of your diabetes mellitus. Healthy eating may even help you improve the level of your blood pressure and reach or maintain a healthy weight. General recommendations for healthful eating and cooking habits include:  Eating meals and snacks regularly. Avoid going long periods of time without eating to lose weight.  Eating a diet that consists mainly of plant-based foods, such as fruits, vegetables, nuts, legumes, and whole grains.  Using low-heat cooking methods, such as baking, instead of high-heat cooking methods, such as deep frying.  Work with your dietitian to make sure you understand how to use the Nutrition Facts information on food labels. How can food affect me? Carbohydrates Carbohydrates affect your blood glucose level more than any other type of food. Your dietitian will help you determine how many carbohydrates to eat at each meal and teach you how to count carbohydrates. Counting  carbohydrates is important to keep your blood glucose at a healthy level, especially if you are using insulin or taking certain medicines for diabetes mellitus. Alcohol Alcohol can cause sudden decreases in blood glucose (hypoglycemia), especially if you use insulin or take certain medicines for diabetes mellitus. Hypoglycemia can be a life-threatening condition. Symptoms of hypoglycemia (sleepiness, dizziness, and disorientation) are similar to symptoms of having too much alcohol. If your health care provider has given you approval to drink alcohol, do so in moderation and use the following guidelines:  Women should not have more than one drink per day, and men should not have more than two drinks per day. One drink is equal to: ? 12 oz of beer. ? 5 oz of wine. ? 1 oz of hard liquor.  Do not drink on an empty stomach.  Keep yourself hydrated. Have water, diet soda, or unsweetened iced tea.  Regular soda, juice, and other mixers might contain a lot of carbohydrates and should be counted.  What foods are not recommended? As you make food choices, it is important to remember that all foods are not the same. Some foods have fewer nutrients per serving than other foods, even though they might have the same number of calories or carbohydrates. It is difficult to get your body what it needs when you eat foods with fewer nutrients. Examples of foods that you should avoid that are high in calories and carbohydrates but low in nutrients include:  Trans  fats (most processed foods list trans fats on the Nutrition Facts label).  Regular soda.  Juice.  Candy.  Sweets, such as cake, pie, doughnuts, and cookies.  Fried foods.  What foods can I eat? Eat nutrient-rich foods, which will nourish your body and keep you healthy. The food you should eat also will depend on several factors, including:  The calories you need.  The medicines you take.  Your weight.  Your blood glucose level.  Your  blood pressure level.  Your cholesterol level.  You should eat a variety of foods, including:  Protein. ? Lean cuts of meat. ? Proteins low in saturated fats, such as fish, egg whites, and beans. Avoid processed meats.  Fruits and vegetables. ? Fruits and vegetables that may help control blood glucose levels, such as apples, mangoes, and yams.  Dairy products. ? Choose fat-free or low-fat dairy products, such as milk, yogurt, and cheese.  Grains, bread, pasta, and rice. ? Choose whole grain products, such as multigrain bread, whole oats, and brown rice. These foods may help control blood pressure.  Fats. ? Foods containing healthful fats, such as nuts, avocado, olive oil, canola oil, and fish.  Does everyone with diabetes mellitus have the same meal plan? Because every person with diabetes mellitus is different, there is not one meal plan that works for everyone. It is very important that you meet with a dietitian who will help you create a meal plan that is just right for you. This information is not intended to replace advice given to you by your health care provider. Make sure you discuss any questions you have with your health care provider. Document Released: 06/24/2005 Document Revised: 03/04/2016 Document Reviewed: 08/24/2013 Elsevier Interactive Patient Education  2017 [employer].

## 2017-04-11 NOTE — Telephone Encounter (Signed)
Noted. Thanks.

## 2017-04-15 ENCOUNTER — Encounter: Payer: Self-pay | Admitting: Adult Health

## 2017-04-15 ENCOUNTER — Ambulatory Visit (INDEPENDENT_AMBULATORY_CARE_PROVIDER_SITE_OTHER): Payer: BLUE CROSS/BLUE SHIELD | Admitting: Adult Health

## 2017-04-15 VITALS — BP 126/60 | HR 77 | Ht 66.0 in | Wt 253.6 lb

## 2017-04-15 DIAGNOSIS — D860 Sarcoidosis of lung: Secondary | ICD-10-CM | POA: Diagnosis not present

## 2017-04-15 DIAGNOSIS — J449 Chronic obstructive pulmonary disease, unspecified: Secondary | ICD-10-CM

## 2017-04-15 DIAGNOSIS — Z01818 Encounter for other preprocedural examination: Secondary | ICD-10-CM | POA: Diagnosis not present

## 2017-04-15 NOTE — Progress Notes (Signed)
$'@Patient'd$  ID: [name], female    DOB: 13-Aug-1961, 56 y.o.   MRN: [id number]  Chief Complaint  Patient presents with  . Follow-up    Sarcoid     Referring provider: [name], MD  HPI: 56 year old female never smoker  followed for sarcoidosis (dx 2008 via mediastinoscopy) on chronic steroids at prednisone '5mg'$  daily.  Followed by Dr. Casper Harrison at [facility] .   TEST  DATA: CT chest (07/03/13) No CT findings to suggest sarcoidosis. A single tiny punctate indeterminate nodule is noted. The nodule's oval configuration makes an intrapulmonary lymph node or other benign entity more likely. If patient has no risk factors for lung cancer, no  further followup would be warranted.  Chest x-ray 11/30/15 No acute cardiopulmonary disease.  PFTs 02/12/16 FVC 2.12 (58%) FEV1 1.75 (1%) F/F 83 TLC 75% DLCO 65% Moderate restriction and reduction in diffusion capacity. Improved compared to 2015  10/22/14 FVC 2.12 (57%) FEV1 1.57 (54%) F/F 74 DLCO 74.5%  03/25/14  FVC 2.09 [60%] FEV1 1.63 (59%] F/F 78 Moderate restriction  Surgical pathology, mediastinal lymph nodes 11/07/06 Nonnecrotizing granulomas.  04/15/2017 Follow up: Sarcoid /Preop clearance  Pt returns for follow up for Sarcoid . She is managed on prednisone '5mg'$  daily . She was last seen in 2017 . At that time was changed from [facility] to [facility] for RAD /Asthma . She was suppose to have decreased Prednisone to '4mg'$  daily however she remains on '5mg'$  daily . Did not keep follow up in 3 months as instructed. She says she has been doing well with no flare of cough or wheezing. Previous CT chest in 2014 and 2015 showed no sarcoid involvement or adenopathy.  Spirometry today shows moderate restriction without airflow obstruction. Similar to 2015.  FEV1 64%, ratio 81 , FVC 61% Was admitted in May with chest pain . Cardiac cath showed minimal nonobstructive dz in LAD.   Right Knee replacement with Dr. Sharol Given . Schedule has not been  set. Need pulmonary clearance.  Lives at home independently . She does not work is on disability. Drives. No Dyspnea at rest. Some dyspnea with prolonged walking and in extreme heat.  No SABA use. No recent flares or hospitlazations for asthma .   CXR 03/09/17 showed no acute process.    Allergies  Allergen Reactions  . Methotrexate Other (See Comments)    Peri-oral and buccal lesions  . Vancomycin Other (See Comments)    Nephrotoxicity   . Clindamycin/Lincomycin Nausea And Vomiting and Rash  . Chlorhexidine Itching  . Doxycycline Rash  . Lisinopril Cough  . Teflaro [Ceftaroline] Rash    Immunization History  Administered Date(s) Administered  . H1N1 09/24/2008  . Influenza Split 07/07/2011, 07/18/2012  . Influenza Whole 08/15/2007, 06/26/2008, 07/23/2009, 07/16/2010  . Influenza, Seasonal, Injecte, Preservative Fre 10/26/2011, 07/18/2012  . Influenza,inj,Quad PF,36+ Mos 06/12/2013, 07/15/2014, 08/08/2015, 06/22/2016  . Influenza-Unspecified 09/24/2008  . Pneumococcal Conjugate-13 05/06/2014  . Pneumococcal Polysaccharide-23 09/30/2008, 02/13/2013  . Td 05/21/2010  . Zoster Recombinat (Shingrix) 03/02/2017    Past Medical History:  Diagnosis Date  . Abnormal SPEP 04/17/2014  . Acute left ankle pain 01/26/2017  . ANEMIA-UNSPECIFIED 09/18/2009  . Chronic diastolic heart failure, NYHA class 2 (HCC)    LVEDP roughly 20% by cath  . COPD (chronic obstructive pulmonary disease) ([place])   . Depression   . DIABETES MELLITUS, TYPE II 08/21/2006  . Diabetic osteomyelitis ([place]) 05/29/2015  . Fracture of 5th metatarsal    non union  . GERD  08/21/2006  . GOUT 08/20/2010  . Hx of umbilical hernia repair   . HYPERLIPIDEMIA 08/21/2006  . HYPERTENSION 08/21/2006  . Infection of wound due to methicillin resistant Staphylococcus aureus (MRSA)   . Internal hemorrhoids   . Multiple allergies 10/14/2016  . OBESITY 06/04/2009  . Onychomycosis 10/27/2015  . Osteomyelitis of left foot ([place])  05/29/2015  . Pulmonary sarcoidosis ([place])    Followed locally by pulmonology, but also by Dr. Casper Harrison at [facility]  . Right knee pain 01/26/2017  . Vocal cord dysfunction   . Wears partial dentures     Tobacco History: History  Smoking Status  . Never Smoker  Smokeless Tobacco  . Never Used   Counseling given: Not Answered   Outpatient Encounter Prescriptions as of 04/15/2017  Medication Sig  . albuterol (PROVENTIL HFA;VENTOLIN HFA) 108 (90 BASE) MCG/ACT inhaler Inhale 1-2 puffs into the lungs every 6 (six) hours as needed for wheezing or shortness of breath.  . allopurinol (ZYLOPRIM) 100 MG tablet Take 100 mg by mouth at bedtime.   [name] aspirin EC 81 MG tablet Take 81 mg by mouth daily.   [name] atorvastatin (LIPITOR) 40 MG tablet TAKE 1 TAB BY MOUTH ONCE DAILY  . BAYER MICROLET LANCETS lancets Use as instructed to check sugar 3 times daily  . benzonatate (TESSALON) 100 MG capsule Take 100 mg by mouth 3 (three) times daily as needed for cough. Reported on 01/05/2016  . Blood Glucose Monitoring Suppl (BAYER CONTOUR MONITOR) w/Device KIT Use daily to check sugar  . buPROPion (WELLBUTRIN XL) 300 MG 24 hr tablet TAKE 1 TABLET (300 MG TOTAL) BY MOUTH DAILY.  . carvedilol (COREG) 25 MG tablet Take 1 tablet (25 mg total) by mouth 2 (two) times daily with a meal.  . chlorpheniramine-HYDROcodone (TUSSIONEX) 10-8 MG/5ML SUER Take 5 mLs by mouth daily as needed for cough. Reported on 01/05/2016  . DEXILANT 60 MG capsule TAKE ONE CAPSULE BY MOUTH EVERY DAY  . diclofenac sodium (VOLTAREN) 1 % GEL Apply 2 g topically 4 (four) times daily.  [name] diltiazem (CARDIZEM CD) 120 MG 24 hr capsule Take 1 capsule (120 mg total) by mouth daily.  [name] estradiol (ESTRACE) 2 MG tablet Take 2 mg by mouth at bedtime.   . fenofibrate (TRICOR) 145 MG tablet TAKE 1 TABLET (145 MG TOTAL) BY MOUTH DAILY.  . fluticasone (FLONASE) 50 MCG/ACT nasal spray Place 2 sprays into both nostrils daily.  . furosemide (LASIX) 40 MG  tablet TAKE 1 TABLET BY MOUTH TWICE A DAY  . gabapentin (NEURONTIN) 100 MG capsule TAKE ONE CAPSULE BY MOUTH 3 TIMES A DAY  . glucose blood (BAYER CONTOUR TEST) test strip Use as instructed to check sugar 3 times daily  . glyBURIDE (DIABETA) 2.5 MG tablet Take 2.5 mg by mouth at bedtime.   . insulin aspart (NOVOLOG FLEXPEN) 100 UNIT/ML FlexPen Inject 18-23 Units into the skin 3 (three) times daily with meals. Per sliding scale  . Insulin Glargine (TOUJEO SOLOSTAR) 300 UNIT/ML SOPN Inject 45 Units into the skin daily. (Patient taking differently: Inject 45 Units into the skin at bedtime. )  . Insulin Pen Needle 33G X 4 MM MISC 1 each by Does not apply route 4 (four) times daily.  [name] LORazepam (ATIVAN) 1 MG tablet   . losartan (COZAAR) 50 MG tablet TAKE 1 TABLET (50 MG TOTAL) BY MOUTH DAILY.  . metFORMIN (GLUCOPHAGE) 1000 MG tablet TAKE 1 TABLET BY MOUTH TWICE A DAY WITH FOOD  . mometasone-formoterol (  DULERA) 200-5 MCG/ACT AERO Inhale 2 puffs into the lungs 2 (two) times daily.  . nitroGLYCERIN (NITROSTAT) 0.4 MG SL tablet Place 1 tablet (0.4 mg total) under the tongue every 5 (five) minutes as needed for chest pain. Reported on 01/05/2016  . ondansetron (ZOFRAN-ODT) 4 MG disintegrating tablet Take 4 mg by mouth every 8 (eight) hours as needed for nausea or vomiting. Reported on 01/05/2016  . orlistat (XENICAL) 120 MG capsule Take 1 capsule (120 mg total) by mouth 3 (three) times daily with meals.  [name] PARoxetine Mesylate (BRISDELLE) 7.5 MG CAPS Take 7.5 mg by mouth daily.  . Potassium Chloride ER 20 MEQ TBCR TAKE ONE TABLET BY MOUTH THREE TIMES DAILY (Patient taking differently: Take 30 mEq by mouth two times a day)  . prednisoLONE 5 MG TABS tablet Take 5 mg by mouth daily.  . traMADol (ULTRAM) 50 MG tablet Take 1 tablet (50 mg total) by mouth every 12 (twelve) hours as needed for moderate pain or severe pain.  [name] venlafaxine XR (EFFEXOR-XR) 75 MG 24 hr capsule Take 2 capsules (150 mg total) by mouth at  bedtime.  . vitamin B-12 (CYANOCOBALAMIN) 1000 MCG tablet Take 1,000 mcg by mouth daily.  . [DISCONTINUED] gabapentin (NEURONTIN) 100 MG capsule TAKE ONE CAPSULE BY MOUTH 3 TIMES A DAY   No facility-administered encounter medications on file as of 04/15/2017.      Review of Systems  Constitutional:   No  weight loss, night sweats,  Fevers, chills, fatigue, or  lassitude.  HEENT:   No headaches,  Difficulty swallowing,  Tooth/dental problems, or  Sore throat,                No sneezing, itching, ear ache,  +nasal congestion, post nasal drip,   CV:  No chest pain,  Orthopnea, PND, swelling in lower extremities, anasarca, dizziness, palpitations, syncope.   GI  No heartburn, indigestion, abdominal pain, nausea, vomiting, diarrhea, change in bowel habits, loss of appetite, bloody stools.   Resp: No shortness of breath with exertion or at rest.  No excess mucus, no productive cough,  No non-productive cough,  No coughing up of blood.  No change in color of mucus.  No wheezing.  No chest wall deformity  Skin: no rash or lesions.  GU: no dysuria, change in color of urine, no urgency or frequency.  No flank pain, no hematuria   MS: + knee pain.    Physical Exam  BP 126/60 (BP Location: Left Arm, Patient Position: Sitting, Cuff Size: Normal)   Pulse 77   Ht 5' 6"  (1.676 m)   Wt 253 lb 9.6 oz (115 kg)   SpO2 97%   BMI 40.93 kg/m   GEN: A/Ox3; pleasant , NAD, obese    HEENT:  [place]/AT,  EACs-clear, TMs-wnl, NOSE-clear, THROAT-clear, no lesions, no postnasal drip or exudate noted.   NECK:  Supple w/ fair ROM; no JVD; normal carotid impulses w/o bruits; no thyromegaly or nodules palpated; no lymphadenopathy.    RESP  Clear  P & A; w/o, wheezes/ rales/ or rhonchi. no accessory muscle use, no dullness to percussion  CARD:  RRR, no m/r/g, no peripheral edema, pulses intact, no cyanosis or clubbing.  GI:   Soft & nt; nml bowel sounds; no organomegaly or masses detected.   Musco: Warm  bil, no deformities or joint swelling noted.   Neuro: alert, no focal deficits noted.    Skin: Warm, no lesions or rashes    Lab Results:  BMET   Imaging:    Assessment & Plan:   Preoperative clearance Pulmonary Preop Clearance . Pt does not appear to have active sarcoid . Asthmatic Bronchitis is well controlled on Advair . Pulmonary function is stable . Recent CXR is stable.  She is on chronic steroids which may prolong healing time along with her underlying DM.  Advised on pulmonary hygiene regimen .  She is approved from pulmonary standpoint with mild to moderate risk profile.  Major Pulmonary risks identified in the multifactorial risk analysis are but not limited to a) pneumonia; b) recurrent intubation risk; c) prolonged or recurrent acute respiratory failure needing mechanical ventilation; d) prolonged hospitalization; e) DVT/Pulmonary embolism; f) Acute Pulmonary edema  Recommend 1. Short duration of surgery as much as possible and avoid paralytic if possible 3. DVT prophylaxis 4. Aggressive pulmonary toilet with o2, bronchodilatation, and incentive spirometry and early ambulation     Sarcoidosis of lung ([place]) Well controlled on present regimen  Would love to get prednisone down further but will hold off until after surgery .  Have her return in 3 months for evaluation .   Obstructive chronic bronchitis without exacerbation Doing well on Advair .       Rexene Edison, NP 04/15/2017

## 2017-04-15 NOTE — Patient Instructions (Signed)
Continue on Advair 2 puffs Twice daily  , rinse after use.  Add Zyrtec or Claritin 10mg  At bedtime As needed   follow up Dr. Vaughan Browner in 3 months and As needed   Good luck on upcoming surgery .

## 2017-04-15 NOTE — Assessment & Plan Note (Signed)
Pulmonary Preop Clearance . Pt does not appear to have active sarcoid . Asthmatic Bronchitis is well controlled on Advair . Pulmonary function is stable . Recent CXR is stable.  She is on chronic steroids which may prolong healing time along with her underlying DM.  Advised on pulmonary hygiene regimen .  She is approved from pulmonary standpoint with mild to moderate risk profile.  Major Pulmonary risks identified in the multifactorial risk analysis are but not limited to a) pneumonia; b) recurrent intubation risk; c) prolonged or recurrent acute respiratory failure needing mechanical ventilation; d) prolonged hospitalization; e) DVT/Pulmonary embolism; f) Acute Pulmonary edema  Recommend 1. Short duration of surgery as much as possible and avoid paralytic if possible 3. DVT prophylaxis 4. Aggressive pulmonary toilet with o2, bronchodilatation, and incentive spirometry and early ambulation

## 2017-04-15 NOTE — Assessment & Plan Note (Signed)
Well controlled on present regimen  Would love to get prednisone down further but will hold off until after surgery .  Have her return in 3 months for evaluation .

## 2017-04-15 NOTE — Assessment & Plan Note (Signed)
Doing well on Advair .

## 2017-04-18 ENCOUNTER — Ambulatory Visit: Payer: BLUE CROSS/BLUE SHIELD | Admitting: Infectious Disease

## 2017-04-21 DIAGNOSIS — H16201 Unspecified keratoconjunctivitis, right eye: Secondary | ICD-10-CM | POA: Diagnosis not present

## 2017-04-22 DIAGNOSIS — H16201 Unspecified keratoconjunctivitis, right eye: Secondary | ICD-10-CM | POA: Diagnosis not present

## 2017-04-29 ENCOUNTER — Other Ambulatory Visit: Payer: Self-pay

## 2017-04-29 DIAGNOSIS — H16201 Unspecified keratoconjunctivitis, right eye: Secondary | ICD-10-CM | POA: Diagnosis not present

## 2017-04-29 MED ORDER — FLUTICASONE PROPIONATE 50 MCG/ACT NA SUSP: 2.0000 | Freq: Every day | NASAL | 3 refills | Status: DC

## 2017-05-01 ENCOUNTER — Other Ambulatory Visit: Payer: Self-pay | Admitting: Internal Medicine

## 2017-05-02 ENCOUNTER — Encounter: Payer: Self-pay | Admitting: Family Medicine

## 2017-05-02 ENCOUNTER — Ambulatory Visit (INDEPENDENT_AMBULATORY_CARE_PROVIDER_SITE_OTHER): Payer: BLUE CROSS/BLUE SHIELD | Admitting: Family Medicine

## 2017-05-02 VITALS — BP 128/62 | HR 75 | Temp 98.8°F | Ht 66.0 in | Wt 254.2 lb

## 2017-05-02 DIAGNOSIS — E1165 Type 2 diabetes mellitus with hyperglycemia: Secondary | ICD-10-CM | POA: Diagnosis not present

## 2017-05-02 DIAGNOSIS — F3342 Major depressive disorder, recurrent, in full remission: Secondary | ICD-10-CM

## 2017-05-02 DIAGNOSIS — D860 Sarcoidosis of lung: Secondary | ICD-10-CM | POA: Diagnosis not present

## 2017-05-02 DIAGNOSIS — Z23 Encounter for immunization: Secondary | ICD-10-CM | POA: Diagnosis not present

## 2017-05-02 DIAGNOSIS — N183 Chronic kidney disease, stage 3 unspecified: Secondary | ICD-10-CM

## 2017-05-02 DIAGNOSIS — R232 Flushing: Secondary | ICD-10-CM

## 2017-05-02 DIAGNOSIS — Z794 Long term (current) use of insulin: Secondary | ICD-10-CM

## 2017-05-02 DIAGNOSIS — E1140 Type 2 diabetes mellitus with diabetic neuropathy, unspecified: Secondary | ICD-10-CM | POA: Diagnosis not present

## 2017-05-02 DIAGNOSIS — IMO0002 Reserved for concepts with insufficient information to code with codable children: Secondary | ICD-10-CM

## 2017-05-02 NOTE — Patient Instructions (Addendum)
Glad diabetes is better- will be better for surgery  Restart the paxil- thanks for trying off of this but hot flashes are too intense  Pick up a refill of tramadol- want you to use this in place of ibuprofen as able/tolerable to help protect your kidneys

## 2017-05-02 NOTE — Assessment & Plan Note (Signed)
S: Patient followed up with Dr. Loanne Drilling and had mealtime novolog increased to novolog 10 with small,12 with mediium, 23 with large meal. This morning CBG was 137 which is reasonable for her especially with he rusing prednisone drops for eyes.  also compliant with toujeo 45. Glyburide daily. Metformin 1g BID A/P: we discussed thankful for improvement but need to continue to work on diet and weight loss for long term health.  Has follow up visit with Dr. Loanne Drilling within about a month

## 2017-05-02 NOTE — Progress Notes (Signed)
Subjective:  [name] is a 56 y.o. year old very pleasant female patient who presents for/with See problem oriented charting ROS- baseline shortness of breath. No chest pain. No increased edema.  No weight gain.   Past Medical History-  Patient Active Problem List   Diagnosis Date Noted  . Obstructive chronic bronchitis without exacerbation ([place]) 09/18/2013    Priority: High  . Chest pain 04/11/2013    Priority: High  . Hypertensive heart disease with chronic diastolic congestive heart failure ([place])     Priority: High  . Sarcoidosis of lung ([place]) 04/10/2007    Priority: High  . Type II diabetes mellitus with neurological manifestations, uncontrolled ([place]) 08/21/2006    Priority: High  . Osteoarthritis of right knee 01/26/2017    Priority: Medium  . CKD (chronic kidney disease) stage 3, GFR 30-59 ml/min 04/27/2015    Priority: Medium  . Fatty liver 09/30/2014    Priority: Medium  . Depression     Priority: Medium  . Gout 08/20/2010    Priority: Medium  . Anemia 09/18/2009    Priority: Medium  . Sleep apnea 04/21/2009    Priority: Medium  . Hyperlipidemia 08/21/2006    Priority: Medium  . Essential hypertension 08/21/2006    Priority: Medium  . Onychomycosis 10/27/2015    Priority: Low  . Hot flashes 07/15/2014    Priority: Low  . Abnormal SPEP 04/17/2014    Priority: Low  . Fracture of left leg 04/17/2014    Priority: Low  . Cushingoid side effect of steroids ([place]) 04/17/2014    Priority: Low  . Internal hemorrhoids     Priority: Low  . Preoperative clearance 03/25/2014    Priority: Low  . Solitary pulmonary nodule, on CT 02/2013 - stable over 2 years in 2015 02/20/2013    Priority: Low  . Morbid obesity ([place]) 06/04/2009    Priority: Low  . GERD 08/21/2006    Priority: Low  . Acute left ankle pain 01/26/2017  . Multiple allergies 10/14/2016  . Osteomyelitis of left foot ([place]) 05/29/2015  . MRSA (methicillin resistant staph aureus) culture positive  03/27/2015  . Wound infection complicating hardware ([place]) 03/27/2015    Medications- reviewed and updated Current Outpatient Prescriptions  Medication Sig Dispense Refill  . albuterol (PROVENTIL HFA;VENTOLIN HFA) 108 (90 BASE) MCG/ACT inhaler Inhale 1-2 puffs into the lungs every 6 (six) hours as needed for wheezing or shortness of breath. 8 g 5  . allopurinol (ZYLOPRIM) 100 MG tablet Take 100 mg by mouth at bedtime.     [name] aspirin EC 81 MG tablet Take 81 mg by mouth daily.     [name] atorvastatin (LIPITOR) 40 MG tablet TAKE 1 TAB BY MOUTH ONCE DAILY 90 tablet 3  . BAYER MICROLET LANCETS lancets Use as instructed to check sugar 3 times daily 300 each 5  . benzonatate (TESSALON) 100 MG capsule Take 100 mg by mouth 3 (three) times daily as needed for cough. Reported on 01/05/2016    . Blood Glucose Monitoring Suppl (BAYER CONTOUR MONITOR) w/Device KIT Use daily to check sugar 1 kit 0  . buPROPion (WELLBUTRIN XL) 300 MG 24 hr tablet TAKE 1 TABLET (300 MG TOTAL) BY MOUTH DAILY. 90 tablet 3  . carvedilol (COREG) 25 MG tablet Take 1 tablet (25 mg total) by mouth 2 (two) times daily with a meal. 180 tablet 3  . DEXILANT 60 MG capsule TAKE ONE CAPSULE BY MOUTH EVERY DAY 30 capsule 5  . diclofenac sodium (VOLTAREN)  1 % GEL Apply 2 g topically 4 (four) times daily. 100 g 0  . diltiazem (CARDIZEM CD) 120 MG 24 hr capsule Take 1 capsule (120 mg total) by mouth daily. 90 capsule 3  . estradiol (ESTRACE) 2 MG tablet Take 2 mg by mouth at bedtime.     . fenofibrate (TRICOR) 145 MG tablet TAKE 1 TABLET (145 MG TOTAL) BY MOUTH DAILY. 30 tablet 6  . fluticasone (FLONASE) 50 MCG/ACT nasal spray Place 2 sprays into both nostrils daily. 48 g 3  . furosemide (LASIX) 40 MG tablet TAKE 1 TABLET BY MOUTH TWICE A DAY 60 tablet 11  . gabapentin (NEURONTIN) 100 MG capsule TAKE ONE CAPSULE BY MOUTH 3 TIMES A DAY 90 capsule 0  . glucose blood (BAYER CONTOUR TEST) test strip Use as instructed to check sugar 3 times daily 300 each  5  . glyBURIDE (DIABETA) 2.5 MG tablet Take 2.5 mg by mouth at bedtime.     . insulin aspart (NOVOLOG FLEXPEN) 100 UNIT/ML FlexPen Inject 18-23 Units into the skin 3 (three) times daily with meals. Per sliding scale 30 mL 5  . Insulin Glargine (TOUJEO SOLOSTAR) 300 UNIT/ML SOPN Inject 45 Units into the skin daily. (Patient taking differently: Inject 45 Units into the skin at bedtime. ) 5 pen 5  . Insulin Pen Needle 33G X 4 MM MISC 1 each by Does not apply route 4 (four) times daily. 200 each 11  . LORazepam (ATIVAN) 1 MG tablet     . losartan (COZAAR) 50 MG tablet TAKE 1 TABLET (50 MG TOTAL) BY MOUTH DAILY.  3  . metFORMIN (GLUCOPHAGE) 1000 MG tablet TAKE 1 TABLET BY MOUTH TWICE A DAY WITH FOOD 180 tablet 0  . mometasone-formoterol (DULERA) 200-5 MCG/ACT AERO Inhale 2 puffs into the lungs 2 (two) times daily. 13 g 5  . nitroGLYCERIN (NITROSTAT) 0.4 MG SL tablet Place 1 tablet (0.4 mg total) under the tongue every 5 (five) minutes as needed for chest pain. Reported on 01/05/2016 100 tablet 0  . ondansetron (ZOFRAN-ODT) 4 MG disintegrating tablet Take 4 mg by mouth every 8 (eight) hours as needed for nausea or vomiting. Reported on 01/05/2016  0  . orlistat (XENICAL) 120 MG capsule Take 1 capsule (120 mg total) by mouth 3 (three) times daily with meals. 90 capsule 2  . PARoxetine Mesylate (BRISDELLE) 7.5 MG CAPS Take 7.5 mg by mouth daily.    . Potassium Chloride ER 20 MEQ TBCR TAKE ONE TABLET BY MOUTH THREE TIMES DAILY (Patient taking differently: Take 30 mEq by mouth two times a day) 90 tablet 3  . prednisoLONE 5 MG TABS tablet Take 5 mg by mouth daily.    Nelva Nay SOLOSTAR 300 UNIT/ML SOPN INJECT 40 UNITS INTO THE SKIN AT BEDTIME 15 pen 1  . traMADol (ULTRAM) 50 MG tablet Take 1 tablet (50 mg total) by mouth every 12 (twelve) hours as needed for moderate pain or severe pain. 30 tablet 2  . venlafaxine XR (EFFEXOR-XR) 75 MG 24 hr capsule Take 2 capsules (150 mg total) by mouth at bedtime.    .  vitamin B-12 (CYANOCOBALAMIN) 1000 MCG tablet Take 1,000 mcg by mouth daily.     No current facility-administered medications for this visit.     Objective: BP 128/62 (BP Location: Left Arm, Patient Position: Sitting, Cuff Size: Large)   Pulse 75   Temp 98.8 F (37.1 C) (Oral)   Ht '5\' 6"'$  (1.676 m)   Wt 254 lb 3.2  oz (115.3 kg)   SpO2 96%   BMI 41.03 kg/m  Gen: NAD, resting comfortably CV: RRR no murmurs rubs or gallops Lungs: CTAB no crackles, wheeze, rhonchi Abdomen: soft/nontender/nondistended. Morbid obesity Ext: no edema Skin: warm, dry  Assessment/Plan:  Type II diabetes mellitus with neurological manifestations, uncontrolled ([place]) S: Patient followed up with Dr. Loanne Drilling and had mealtime novolog increased to novolog 10 with small,12 with mediium, 23 with large meal. This morning CBG was 137 which is reasonable for her especially with he rusing prednisone drops for eyes.  also compliant with toujeo 45. Glyburide daily. Metformin 1g BID A/P: we discussed thankful for improvement but need to continue to work on diet and weight loss for long term health.  Has follow up visit with Dr. Loanne Drilling within about a month  Hot flashes S: per hospitalist suggestion we trialed off paxil 7.65m while she remained on estradiol, venlafaxine. Despite this she had intense recurrence of hot flashes.  A/P: Restart paxil at very low dose 7.539m    CKD (chronic kidney disease) stage 3, GFR 30-59 ml/min S: GFR was near 5071mother dealth with kidney failure. She had prior AKI when hospitalized last year. She is using ibuprofen as needed for knee OA A/P: I discussed with patient with her CKD would want to reduce nsaid intake or eliminate. We discussed going back to using tramadol as needed instead of ibuprofen. She is in agreement.    [facility] she is going ot follow up with Dr. DoCasper Harrisonho [facility] to follow with pul[facility]  locally. Breathing pattern stable today.   Depression S: depression remained controlled on wellbutrin 30020mL and effexor 77m48mD without paxil but as noted hot flashes increased. PHQ2 of 0 today A/P: see hot flashes notes but will continue current meds and add paxil 7.5mg 33mk in.    4-5 month follow-up verbally advised  Orders Placed This Encounter  Procedures  . Varicella-zoster vaccine IM   Return precautions advised.  StephGarret Reddish

## 2017-05-02 NOTE — Assessment & Plan Note (Signed)
S: depression remained controlled on wellbutrin 300mg  XL and effexor 75mg  BID without paxil but as noted hot flashes increased. PHQ2 of 0 today A/P: see hot flashes notes but will continue current meds and add paxil 7.5mg  back in.

## 2017-05-02 NOTE — Assessment & Plan Note (Signed)
S: per hospitalist suggestion we trialed off paxil 7.5mg  while she remained on estradiol, venlafaxine. Despite this she had intense recurrence of hot flashes.  A/P: Restart paxil at very low dose 7.5mg .

## 2017-05-02 NOTE — Assessment & Plan Note (Addendum)
She updates me she is going ot follow up with Dr. Casper Harrison who is an expert in sarcoidosis from [facility] and will continue to follow with pulmonology locally. Breathing pattern stable today.

## 2017-05-02 NOTE — Assessment & Plan Note (Signed)
S: GFR was near 17- mother dealth with kidney failure. She had prior AKI when hospitalized last year. She is using ibuprofen as needed for knee OA A/P: I discussed with patient with her CKD would want to reduce nsaid intake or eliminate. We discussed going back to using tramadol as needed instead of ibuprofen. She is in agreement.

## 2017-05-03 ENCOUNTER — Ambulatory Visit (INDEPENDENT_AMBULATORY_CARE_PROVIDER_SITE_OTHER): Payer: BLUE CROSS/BLUE SHIELD | Admitting: Endocrinology

## 2017-05-03 ENCOUNTER — Encounter: Payer: Self-pay | Admitting: Endocrinology

## 2017-05-03 ENCOUNTER — Other Ambulatory Visit: Payer: Self-pay | Admitting: Family Medicine

## 2017-05-03 DIAGNOSIS — E1140 Type 2 diabetes mellitus with diabetic neuropathy, unspecified: Secondary | ICD-10-CM

## 2017-05-03 DIAGNOSIS — E1165 Type 2 diabetes mellitus with hyperglycemia: Secondary | ICD-10-CM

## 2017-05-03 DIAGNOSIS — Z794 Long term (current) use of insulin: Secondary | ICD-10-CM

## 2017-05-03 DIAGNOSIS — IMO0002 Reserved for concepts with insufficient information to code with codable children: Secondary | ICD-10-CM

## 2017-05-03 MED ORDER — INSULIN ASPART 100 UNIT/ML FLEXPEN: 19.0000 [IU] | PEN_INJECTOR | Freq: Three times a day (TID) | SUBCUTANEOUS | 5 refills | Status: DC

## 2017-05-03 MED ORDER — GLYBURIDE 2.5 MG PO TABS: 1.2500 mg | ORAL_TABLET | Freq: Every day | ORAL | 2 refills | Status: DC

## 2017-05-03 NOTE — Progress Notes (Signed)
Subjective:    Patient ID: [name], female    DOB: 11/19/1960, 56 y.o.   MRN: [id number]  HPI Pt returns for f/u of diabetes mellitus: DM type: Insulin-requiring type 2 Dx'ed: 5697 Complications: polyneuropathy, renal insuff, and mild CAD Therapy: insulin since, and 2 oral meds.  GDM: never DKA: never Severe hypoglycemia: never Pancreatitis: never Pancreatic imaging: CT (2015) showed fatty atrophy Other: she takes multiple daily injections; she intermittently takes prednisone for sarcoidosis Interval history: she will have right TKR soon.  She says cbg's vary from 90-200.  It is in general higher as the day goes on.  Past Medical History:  Diagnosis Date  . Abnormal SPEP 04/17/2014  . Acute left ankle pain 01/26/2017  . ANEMIA-UNSPECIFIED 09/18/2009  . Chronic diastolic heart failure, NYHA class 2 (HCC)    LVEDP roughly 20% by cath  . COPD (chronic obstructive pulmonary disease) ([place])   . Depression   . DIABETES MELLITUS, TYPE II 08/21/2006  . Diabetic osteomyelitis ([place]) 05/29/2015  . Fracture of 5th metatarsal    non union  . GERD 08/21/2006  . GOUT 08/20/2010  . Hx of umbilical hernia repair   . HYPERLIPIDEMIA 08/21/2006  . HYPERTENSION 08/21/2006  . Infection of wound due to methicillin resistant Staphylococcus aureus (MRSA)   . Internal hemorrhoids   . Multiple allergies 10/14/2016  . OBESITY 06/04/2009  . Onychomycosis 10/27/2015  . Osteomyelitis of left foot ([place]) 05/29/2015  . Pulmonary sarcoidosis ([place])    Followed locally by pulmonology, but also by Dr. Casper Harrison at [facility]  . Right knee pain 01/26/2017  . Vocal cord dysfunction   . Wears partial dentures     Past Surgical History:  Procedure Laterality Date  . ABDOMINAL HYSTERECTOMY    . APPENDECTOMY    . BLADDER SUSPENSION  11/11/2011   Procedure: TRANSVAGINAL TAPE (TVT) PROCEDURE;  Surgeon: Olga Millers, MD;  Location: [facility];  Service: Gynecology;  Laterality: N/A;  . CARDIAC  CATHETERIZATION  07/2010   LVEF 50-55% WITH VERY MILD GLOBAL HYPOKINESIA; ESSENTIALLY NORMAL CORONARY ARTERIES; NORMAL LV FUNCTION  . CAROTIDS  02/18/11   CAROTID DUPLEX; VERTEBRALS ARE PATENT WITH ANTEGRADE FLOW. ICA/CCA RATIO 1.61 ON RIGHT AND 0.75 ON LEFT  . CHOLECYSTECTOMY  1984  . CYSTOSCOPY  11/11/2011   Procedure: CYSTOSCOPY;  Surgeon: Olga Millers, MD;  Location: [facility];  Service: Gynecology;  Laterality: N/A;  . DOPPLER ECHOCARDIOGRAPHY  02/12/2013   LV FUNCTION, SIZE NORMAL; MILD CONCENTRIC LVH; EST EF 55-65%; WALL MOTION NORMAL  . FRACTURE SURGERY     foot  . HERNIA REPAIR    . I&D EXTREMITY Left 06/27/2015   Procedure: Partial Excision Left Calcaneus, Place Antibiotic Beads, and Wound VAC;  Surgeon: Newt Minion, MD;  Location: [place];  Service: Orthopedics;  Laterality: Left;  . KNEE ARTHROSCOPY     right  . LEFT AND RIGHT HEART CATHETERIZATION WITH CORONARY ANGIOGRAM N/A 04/23/2013   Procedure: LEFT AND RIGHT HEART CATHETERIZATION WITH CORONARY ANGIOGRAM;  Surgeon: Leonie Man, MD;  Location: [facility] C[facility] LAB;  Service: Cardiovascular;  Laterality: N/A;  . LEFT HEART CATH AND CORONARY ANGIOGRAPHY N/A 03/11/2017   Procedure: Left Heart Cath and Coronary Angiography;  Surgeon: Sherren Mocha, MD;  Location: [place] CV LAB;  Service: Cardiovascular;  Laterality: N/A;  . LexiScan Myoview  03/09/2013   EF 50%; NORMAL MYOCARDIAL PERFUSION STUDY - breast attenuation  . METATARSAL OSTEOTOMY WITH OPEN REDUCTION INTERNAL FIXATION (ORIF) METATARSAL WITH FUSION  Left 04/09/2014   Procedure: LEFT FOOT FRACTURE OPEN TREATMENT METATARSAL INCLUDES INTERNAL FIXATION EACH;  Surgeon: Lorn Junes, MD;  Location: [place];  Service: Orthopedics;  Laterality: Left;  . NISSEN FUNDOPLICATION  8768  . Right and left CARDIAC CATHETERIZATION  04/23/2013   Angiographic normal coronaries; LVEDP 20 mmHg, PCWP 12-14 mmHg, RAP 12 mmHg.; Fick CO/CI 4.9/2.2  . TUBAL LIGATION     with  reversal in 1994  . VENTRAL HERNIA REPAIR      Social History   Social History  . Marital status: Married    Spouse name: SHATARRA WEHLING  . Number of children: 2  . Years of education: 12   Occupational History  . DISABLED    Social History Main Topics  . Smoking status: Never Smoker  . Smokeless tobacco: Never Used  . Alcohol use No  . Drug use: No  . Sexual activity: Yes    Birth control/ protection: Surgical   Other Topics Concern  . Not on file   Social History Narrative   Married 1994. 2 sons who both live close and 1 grandson.       Disability due to sarcoidosis. 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 albuterol (PROVENTIL HFA;VENTOLIN HFA) 108 (90 BASE) MCG/ACT inhaler Inhale 1-2 puffs into the lungs every 6 (six) hours as needed for wheezing or shortness of breath. 8 g 5  . allopurinol (ZYLOPRIM) 100 MG tablet Take 100 mg by mouth at bedtime.     [name] aspirin EC 81 MG tablet Take 81 mg by mouth daily.     [name] atorvastatin (LIPITOR) 40 MG tablet TAKE 1 TAB BY MOUTH ONCE DAILY 90 tablet 3  . BAYER MICROLET LANCETS lancets Use as instructed to check sugar 3 times daily 300 each 5  . benzonatate (TESSALON) 100 MG capsule Take 100 mg by mouth 3 (three) times daily as needed for cough. Reported on 01/05/2016    . Blood Glucose Monitoring Suppl (BAYER CONTOUR MONITOR) w/Device KIT Use daily to check sugar 1 kit 0  . buPROPion (WELLBUTRIN XL) 300 MG 24 hr tablet TAKE 1 TABLET (300 MG TOTAL) BY MOUTH DAILY. 90 tablet 3  . carvedilol (COREG) 25 MG tablet Take 1 tablet (25 mg total) by mouth 2 (two) times daily with a meal. 180 tablet 3  . DEXILANT 60 MG capsule TAKE ONE CAPSULE BY MOUTH EVERY DAY 30 capsule 5  . diltiazem (CARDIZEM CD) 120 MG 24  hr capsule Take 1 capsule (120 mg total) by mouth daily. 90 capsule 3  . estradiol (ESTRACE) 2 MG tablet Take 2 mg by mouth at bedtime.     . fenofibrate (TRICOR) 145 MG tablet TAKE 1 TABLET (145 MG TOTAL) BY MOUTH DAILY. 30 tablet 6  . fluticasone (FLONASE) 50 MCG/ACT nasal spray Place 2 sprays into both nostrils daily. 48 g 3  . furosemide (LASIX) 40 MG tablet TAKE 1 TABLET BY MOUTH TWICE A DAY 60 tablet 11  . gabapentin (NEURONTIN) 100 MG capsule TAKE ONE CAPSULE BY MOUTH 3 TIMES A DAY 90 capsule 0  . glucose blood (BAYER CONTOUR TEST) test strip Use as instructed to check sugar 3 times daily 300 each 5  . Insulin Glargine (TOUJEO  SOLOSTAR) 300 UNIT/ML SOPN Inject 45 Units into the skin daily. (Patient taking differently: Inject 45 Units into the skin at bedtime. ) 5 pen 5  . Insulin Pen Needle 33G X 4 MM MISC 1 each by Does not apply route 4 (four) times daily. 200 each 11  . LORazepam (ATIVAN) 1 MG tablet     . losartan (COZAAR) 50 MG tablet TAKE 1 TABLET (50 MG TOTAL) BY MOUTH DAILY.  3  . metFORMIN (GLUCOPHAGE) 1000 MG tablet TAKE 1 TABLET BY MOUTH TWICE A DAY WITH FOOD 180 tablet 0  . mometasone-formoterol (DULERA) 200-5 MCG/ACT AERO Inhale 2 puffs into the lungs 2 (two) times daily. 13 g 5  . nitroGLYCERIN (NITROSTAT) 0.4 MG SL tablet Place 1 tablet (0.4 mg total) under the tongue every 5 (five) minutes as needed for chest pain. Reported on 01/05/2016 100 tablet 0  . ondansetron (ZOFRAN-ODT) 4 MG disintegrating tablet Take 4 mg by mouth every 8 (eight) hours as needed for nausea or vomiting. Reported on 01/05/2016  0  . orlistat (XENICAL) 120 MG capsule Take 1 capsule (120 mg total) by mouth 3 (three) times daily with meals. 90 capsule 2  . PARoxetine Mesylate (BRISDELLE) 7.5 MG CAPS Take 7.5 mg by mouth daily.    . Potassium Chloride ER 20 MEQ TBCR TAKE ONE TABLET BY MOUTH THREE TIMES DAILY (Patient taking differently: Take 30 mEq by mouth two times a day) 90 tablet 3  . prednisoLONE 5 MG  TABS tablet Take 5 mg by mouth daily.    Nelva Nay SOLOSTAR 300 UNIT/ML SOPN INJECT 40 UNITS INTO THE SKIN AT BEDTIME 15 pen 1  . traMADol (ULTRAM) 50 MG tablet Take 1 tablet (50 mg total) by mouth every 12 (twelve) hours as needed for moderate pain or severe pain. 30 tablet 2  . venlafaxine XR (EFFEXOR-XR) 75 MG 24 hr capsule Take 2 capsules (150 mg total) by mouth at bedtime.    . vitamin B-12 (CYANOCOBALAMIN) 1000 MCG tablet Take 1,000 mcg by mouth daily.     No current facility-administered medications on file prior to visit.     Allergies  Allergen Reactions  . Methotrexate Other (See Comments)    Peri-oral and buccal lesions  . Vancomycin Other (See Comments)    Nephrotoxicity   . Clindamycin/Lincomycin Nausea And Vomiting and Rash  . Chlorhexidine Itching  . Doxycycline Rash  . Lisinopril Cough  . Teflaro [Ceftaroline] Rash    Family History  Problem Relation Age of Onset  . Diabetes Father   . Heart attack Father 108  . Coronary artery disease Father   . Heart failure Father   . COPD Mother   . Emphysema Mother   . Asthma Mother   . Heart failure Mother   . Heart attack Maternal Grandfather   . Sarcoidosis Maternal Uncle   . Lung cancer Brother   . Diabetes Brother   . Colon cancer Neg Hx     BP (!) 134/56   Pulse 79   Ht 5' 8" (1.727 m)   Wt 255 lb (115.7 kg)   SpO2 96%   BMI 38.77 kg/m    Review of Systems She denies hypoglycemia.     Objective:   Physical Exam VITAL SIGNS:  See vs page GENERAL: no distress Pulses: foot pulses are intact bilaterally.   MSK: no deformity of the feet or ankles.  CV: no edema of the legs or ankles Skin:  no ulcer on the feet or ankles.  normal color and temp on the feet and ankles.  Old healed surgical scar on the left foot Neuro: sensation is intact to touch on the feet and ankles, but decreased from normal.  Ext: There is bilateral onychomycosis of the toenails  Lab Results  Component Value Date   HGBA1C 8.9 (H)  03/09/2017      Assessment & Plan:  Insulin-requiring type 2 DM, with CAD: she needs increased rx.  Renal insuff: in this setting, she needs to minimize or d/c glyburide.    Patient Instructions  check your blood sugar 3 times a day.  vary the time of day when you check, between before the 3 meals, and at bedtime.  also check if you have symptoms of your blood sugar being too high or too low.  please keep a record of the readings and bring it to your next appointment here (or you can bring the meter itself).  You can write it on any piece of paper.  please call us sooner if your blood sugar goes below 70, or if you have a lot of readings over 200. Please increase the novolog to 19-24 units 3 times a day (just before each meal), and: Reduce the glyburide to 1/2 pill per day, and: Please continue the same other diabetes medication.   Please come back for a follow-up appointment in 8 weeks.

## 2017-05-03 NOTE — Patient Instructions (Addendum)
check your blood sugar 3 times a day.  vary the time of day when you check, between before the 3 meals, and at bedtime.  also check if you have symptoms of your blood sugar being too high or too low.  please keep a record of the readings and bring it to your next appointment here (or you can bring the meter itself).  You can write it on any piece of paper.  please call us sooner if your blood sugar goes below 70, or if you have a lot of readings over 200. Please increase the novolog to 19-24 units 3 times a day (just before each meal), and: Reduce the glyburide to 1/2 pill per day, and: Please continue the same other diabetes medication.   Please come back for a follow-up appointment in 8 weeks.

## 2017-05-05 ENCOUNTER — Ambulatory Visit: Payer: BLUE CROSS/BLUE SHIELD

## 2017-05-06 ENCOUNTER — Other Ambulatory Visit (INDEPENDENT_AMBULATORY_CARE_PROVIDER_SITE_OTHER): Payer: Self-pay | Admitting: Orthopaedic Surgery

## 2017-05-12 DIAGNOSIS — S93601A Unspecified sprain of right foot, initial encounter: Secondary | ICD-10-CM | POA: Diagnosis not present

## 2017-05-13 ENCOUNTER — Ambulatory Visit (INDEPENDENT_AMBULATORY_CARE_PROVIDER_SITE_OTHER): Payer: BLUE CROSS/BLUE SHIELD | Admitting: Family

## 2017-05-16 ENCOUNTER — Ambulatory Visit (INDEPENDENT_AMBULATORY_CARE_PROVIDER_SITE_OTHER): Payer: BLUE CROSS/BLUE SHIELD | Admitting: Orthopedic Surgery

## 2017-05-16 ENCOUNTER — Encounter (INDEPENDENT_AMBULATORY_CARE_PROVIDER_SITE_OTHER): Payer: Self-pay | Admitting: Orthopedic Surgery

## 2017-05-16 DIAGNOSIS — M6701 Short Achilles tendon (acquired), right ankle: Secondary | ICD-10-CM

## 2017-05-16 DIAGNOSIS — E1142 Type 2 diabetes mellitus with diabetic polyneuropathy: Secondary | ICD-10-CM

## 2017-05-16 NOTE — Progress Notes (Signed)
Office Visit Note   Patient: [name]           Date of Birth: Nov 14, 1960           MRN: [id number] Visit Date: 05/16/2017              Requested by: [name], MD [place], [place] [address] PCP: [name], MD  No chief complaint on file.     HPI: Patient is a 56 year old woman with diabetic insensate neuropathy osteoarthritis of her right knee who is been having increasing pain of the right lateral foot. Patient denies any trauma she has pain with weightbearing pain with activities of daily living.  Assessment & Plan: Visit Diagnoses:  1. Diabetic polyneuropathy associated with type 2 diabetes mellitus ([place])   2. Contracture of right Achilles tendon     Plan: Patient was given instructions and demonstrated heel cord stretching to do 5 times a minute of time she will wear the fracture boot for normal activities of daily living. Reevaluate in 3 weeks. Discussed that if we cannot improve the dorsiflexion of the ankle with stretching we would need to consider a gastrocnemius recession.  Follow-Up Instructions: Return in about 3 weeks (around 06/06/2017).   Ortho Exam  Patient is alert, oriented, no adenopathy, well-dressed, normal affect, normal respiratory effort. Examination patient has an antalgic gait she has a palpable pulse she has dorsiflexion about 10 short of neutral with her knee extended. She has tenderness to palpation the base of the fifth metatarsal. The plantar fascia is also tender to palpation the Achilles tendon is also tender to palpation and no nodule changes. Radiographs shows a short first metatarsal with a long second third and fourth metatarsal with no evidence of a stress fracture the base of fifth metatarsal.  Imaging: No results found.  Labs: Lab Results  Component Value Date   HGBA1C 8.9 (H) 03/09/2017   HGBA1C 8.7 09/29/2016   HGBA1C 7.5 01/15/2016   ESRSEDRATE 5 01/26/2017   ESRSEDRATE 27 10/14/2016     ESRSEDRATE 38 (H) 01/26/2016   CRP 17.3 (H) 01/26/2017   CRP 24.0 (H) 10/14/2016   CRP 2.5 (H) 01/26/2016   LABURIC 5.1 01/26/2017   REPTSTATUS 10/14/2015 FINAL 10/14/2015   REPTSTATUS 10/16/2015 FINAL 10/14/2015   GRAMSTAIN  10/14/2015    MODERATE WBC PRESENT,BOTH PMN AND MONONUCLEAR FEW SQUAMOUS EPITHELIAL CELLS PRESENT ABUNDANT GRAM POSITIVE COCCI IN PAIRS IN CLUSTERS Performed at [facility]  10/14/2015    NORMAL OROPHARYNGEAL FLORA Performed at [facility] Multiple bacterial morphotypes present, none 11/12/2015   LABORGA predominant. Suggest appropriate recollection if  11/12/2015   LABORGA clinically indicated. 11/12/2015    Orders:  No orders of the defined types were placed in this encounter.  No orders of the defined types were placed in this encounter.    Procedures: No procedures performed  Clinical Data: No additional findings.  ROS:  All other systems negative, except as noted in the HPI. Review of Systems  Objective: Vital Signs: There were no vitals taken for this visit.  Specialty Comments:  No specialty comments available.  PMFS History: Patient Active Problem List   Diagnosis Date Noted  . Acute left ankle pain 01/26/2017  . Osteoarthritis of right knee 01/26/2017  . Multiple allergies 10/14/2016  . Onychomycosis 10/27/2015  . Osteomyelitis of left foot ([place]) 05/29/2015  . CKD (chronic kidney disease) stage 3, GFR 30-59 ml/min  04/27/2015  . MRSA (methicillin resistant staph aureus) culture positive 03/27/2015  . Wound infection complicating hardware ([place]) 03/27/2015  . Fatty liver 09/30/2014  . Hot flashes 07/15/2014  . Abnormal SPEP 04/17/2014  . Fracture of left leg 04/17/2014  . Cushingoid side effect of steroids ([place]) 04/17/2014  . Internal hemorrhoids   . Depression   . Preoperative clearance 03/25/2014  . Obstructive chronic bronchitis without exacerbation ([place]) 09/18/2013  . Chest pain  04/11/2013  . Hypertensive heart disease with chronic diastolic congestive heart failure ([place])   . Solitary pulmonary nodule, on CT 02/2013 - stable over 2 years in 2015 02/20/2013  . Gout 08/20/2010  . Anemia 09/18/2009  . Morbid obesity ([place]) 06/04/2009  . Sleep apnea 04/21/2009  . Sarcoidosis of lung ([place]) 04/10/2007  . Hyperlipidemia 08/21/2006  . Essential hypertension 08/21/2006  . GERD 08/21/2006  . Type II diabetes mellitus with neurological manifestations, uncontrolled ([place]) 08/21/2006   Past Medical History:  Diagnosis Date  . Abnormal SPEP 04/17/2014  . Acute left ankle pain 01/26/2017  . ANEMIA-UNSPECIFIED 09/18/2009  . Chronic diastolic heart failure, NYHA class 2 (HCC)    LVEDP roughly 20% by cath  . COPD (chronic obstructive pulmonary disease) ([place])   . Depression   . DIABETES MELLITUS, TYPE II 08/21/2006  . Diabetic osteomyelitis ([place]) 05/29/2015  . Fracture of 5th metatarsal    non union  . GERD 08/21/2006  . GOUT 08/20/2010  . Hx of umbilical hernia repair   . HYPERLIPIDEMIA 08/21/2006  . HYPERTENSION 08/21/2006  . Infection of wound due to methicillin resistant Staphylococcus aureus (MRSA)   . Internal hemorrhoids   . Multiple allergies 10/14/2016  . OBESITY 06/04/2009  . Onychomycosis 10/27/2015  . Osteomyelitis of left foot ([place]) 05/29/2015  . Pulmonary sarcoidosis ([place])    Followed locally by pulmonology, but also by Dr. Casper Harrison at [facility]  . Right knee pain 01/26/2017  . Vocal cord dysfunction   . Wears partial dentures     Family History  Problem Relation Age of Onset  . Diabetes Father   . Heart attack Father 67  . Coronary artery disease Father   . Heart failure Father   . COPD Mother   . Emphysema Mother   . Asthma Mother   . Heart failure Mother   . Heart attack Maternal Grandfather   . Sarcoidosis Maternal Uncle   . Lung cancer Brother   . Diabetes Brother   . Colon cancer Neg Hx     Past Surgical History:  Procedure  Laterality Date  . ABDOMINAL HYSTERECTOMY    . APPENDECTOMY    . BLADDER SUSPENSION  11/11/2011   Procedure: TRANSVAGINAL TAPE (TVT) PROCEDURE;  Surgeon: Olga Millers, MD;  Location: [facility];  Service: Gynecology;  Laterality: N/A;  . CARDIAC CATHETERIZATION  07/2010   LVEF 50-55% WITH VERY MILD GLOBAL HYPOKINESIA; ESSENTIALLY NORMAL CORONARY ARTERIES; NORMAL LV FUNCTION  . CAROTIDS  02/18/11   CAROTID DUPLEX; VERTEBRALS ARE PATENT WITH ANTEGRADE FLOW. ICA/CCA RATIO 1.61 ON RIGHT AND 0.75 ON LEFT  . CHOLECYSTECTOMY  1984  . CYSTOSCOPY  11/11/2011   Procedure: CYSTOSCOPY;  Surgeon: Olga Millers, MD;  Location: [facility];  Service: Gynecology;  Laterality: N/A;  . DOPPLER ECHOCARDIOGRAPHY  02/12/2013   LV FUNCTION, SIZE NORMAL; MILD CONCENTRIC LVH; EST EF 55-65%; WALL MOTION NORMAL  . FRACTURE SURGERY     foot  . HERNIA REPAIR    . I&D EXTREMITY Left 06/27/2015   Procedure: Partial  Excision Left Calcaneus, Place Antibiotic Beads, and Wound VAC;  Surgeon: Newt Minion, MD;  Location: [place];  Service: Orthopedics;  Laterality: Left;  . KNEE ARTHROSCOPY     right  . LEFT AND RIGHT HEART CATHETERIZATION WITH CORONARY ANGIOGRAM N/A 04/23/2013   Procedure: LEFT AND RIGHT HEART CATHETERIZATION WITH CORONARY ANGIOGRAM;  Surgeon: Leonie Man, MD;  Location: [facility] CATH LAB;  Service: Cardiovascular;  Laterality: N/A;  . LEFT HEART CATH AND CORONARY ANGIOGRAPHY N/A 03/11/2017   Procedure: Left Heart Cath and Coronary Angiography;  Surgeon: Sherren Mocha, MD;  Location: [place] CV LAB;  Service: Cardiovascular;  Laterality: N/A;  . LexiScan Myoview  03/09/2013   EF 50%; NORMAL MYOCARDIAL PERFUSION STUDY - breast attenuation  . METATARSAL OSTEOTOMY WITH OPEN REDUCTION INTERNAL FIXATION (ORIF) METATARSAL WITH FUSION Left 04/09/2014   Procedure: LEFT FOOT FRACTURE OPEN TREATMENT METATARSAL INCLUDES INTERNAL FIXATION EACH;  Surgeon: Lorn Junes, MD;  Location: [place];  Service:  Orthopedics;  Laterality: Left;  . NISSEN FUNDOPLICATION  9767  . Right and left CARDIAC CATHETERIZATION  04/23/2013   Angiographic normal coronaries; LVEDP 20 mmHg, PCWP 12-14 mmHg, RAP 12 mmHg.; Fick CO/CI 4.9/2.2  . TUBAL LIGATION     with reversal in 1994  . VENTRAL HERNIA REPAIR     Social History   Occupational History  . DISABLED    Social History Main Topics  . Smoking status: Never Smoker  . Smokeless tobacco: Never Used  . Alcohol use No  . Drug use: No  . Sexual activity: Yes    Birth control/ protection: Surgical

## 2017-05-17 ENCOUNTER — Other Ambulatory Visit (INDEPENDENT_AMBULATORY_CARE_PROVIDER_SITE_OTHER): Payer: Self-pay | Admitting: Orthopedic Surgery

## 2017-05-20 ENCOUNTER — Other Ambulatory Visit: Payer: Self-pay

## 2017-05-20 MED ORDER — POTASSIUM CHLORIDE ER 20 MEQ PO TBCR: 1.0000 | EXTENDED_RELEASE_TABLET | Freq: Three times a day (TID) | ORAL | 3 refills | Status: DC

## 2017-05-23 ENCOUNTER — Other Ambulatory Visit: Payer: Self-pay | Admitting: Pulmonary Disease

## 2017-05-24 DIAGNOSIS — D860 Sarcoidosis of lung: Secondary | ICD-10-CM | POA: Diagnosis not present

## 2017-05-24 DIAGNOSIS — J4540 Moderate persistent asthma, uncomplicated: Secondary | ICD-10-CM | POA: Diagnosis not present

## 2017-05-24 DIAGNOSIS — Z6841 Body Mass Index (BMI) 40.0 and over, adult: Secondary | ICD-10-CM | POA: Diagnosis not present

## 2017-05-30 ENCOUNTER — Other Ambulatory Visit: Payer: Self-pay | Admitting: Internal Medicine

## 2017-05-30 DIAGNOSIS — M25571 Pain in right ankle and joints of right foot: Secondary | ICD-10-CM | POA: Diagnosis not present

## 2017-05-31 ENCOUNTER — Ambulatory Visit: Payer: BLUE CROSS/BLUE SHIELD | Admitting: Internal Medicine

## 2017-06-01 ENCOUNTER — Encounter: Payer: Self-pay | Admitting: Adult Health

## 2017-06-01 ENCOUNTER — Ambulatory Visit (INDEPENDENT_AMBULATORY_CARE_PROVIDER_SITE_OTHER): Payer: BLUE CROSS/BLUE SHIELD | Admitting: Adult Health

## 2017-06-01 VITALS — BP 122/60 | Temp 98.6°F | Wt 256.0 lb

## 2017-06-01 DIAGNOSIS — N3000 Acute cystitis without hematuria: Secondary | ICD-10-CM

## 2017-06-01 DIAGNOSIS — J0140 Acute pansinusitis, unspecified: Secondary | ICD-10-CM | POA: Diagnosis not present

## 2017-06-01 LAB — POCT URINALYSIS DIPSTICK
Bilirubin, UA: NEGATIVE
Blood, UA: NEGATIVE
Glucose, UA: NEGATIVE
Ketones, UA: NEGATIVE
Nitrite, UA: NEGATIVE
Protein, UA: 15
Spec Grav, UA: 1.025 (ref 1.010–1.025)
Urobilinogen, UA: 0.2 E.U./dL
pH, UA: 5 (ref 5.0–8.0)

## 2017-06-01 MED ORDER — AMOXICILLIN-POT CLAVULANATE 875-125 MG PO TABS: 1.0000 | ORAL_TABLET | Freq: Two times a day (BID) | ORAL | 0 refills | Status: DC

## 2017-06-01 NOTE — Progress Notes (Signed)
Subjective:    Patient ID: [name], female    DOB: 13-Jun-1961, 56 y.o.   MRN: [id number]  HPI  56 year old female who  has a past medical history of Abnormal SPEP (04/17/2014); Acute left ankle pain (01/26/2017); ANEMIA-UNSPECIFIED (09/18/2009); Chronic diastolic heart failure, NYHA class 2 ([place]); COPD (chronic obstructive pulmonary disease) ([place]); Depression; DIABETES MELLITUS, TYPE II (08/21/2006); Diabetic osteomyelitis ([place]) (05/29/2015); Fracture of 5th metatarsal; GERD (08/21/2006); GOUT (40/98/1191); umbilical hernia repair; HYPERLIPIDEMIA (08/21/2006); HYPERTENSION (08/21/2006); Infection of wound due to methicillin resistant Staphylococcus aureus (MRSA); Internal hemorrhoids; Multiple allergies (10/14/2016); OBESITY (06/04/2009); Onychomycosis (10/27/2015); Osteomyelitis of left foot ([place]) (05/29/2015); Pulmonary sarcoidosis ([place]); Right knee pain (01/26/2017); Vocal cord dysfunction; and Wears partial dentures. She presents to the office today for an acute issues of possible sinus infection and possible UTI.   She reports that for the last week to 10 days she has complained of a headache, maxillary sinus pain/pressure, rhinorrhea, and non productive cough. She denies any fevers or feeling ill. She has been using Tussinex at night which helps in the evening  She also reports that for the last 2-3 days she has had right sided low back pain, right flank pain, and urinary frequency. She denies dysuria or hematuria. She has no hx of kidney stones.   Review of Systems See HPI   Past Medical History:  Diagnosis Date  . Abnormal SPEP 04/17/2014  . Acute left ankle pain 01/26/2017  . ANEMIA-UNSPECIFIED 09/18/2009  . Chronic diastolic heart failure, NYHA class 2 (H[facility])    LVEDP roughly 20% by cath  . COPD (chronic obstructive pulmonary disease) ([place])   . Depression   . DIABETES MELLITUS, TYPE II 08/21/2006  . Diabetic osteomyelitis ([place]) 05/29/2015  . Fracture of 5th metatarsal    non union  .  GERD 08/21/2006  . GOUT 08/20/2010  . Hx of umbilical hernia repair   . HYPERLIPIDEMIA 08/21/2006  . HYPERTENSION 08/21/2006  . Infection of wound due to methicillin resistant Staphylococcus aureus (MRSA)   . Internal hemorrhoids   . Multiple allergies 10/14/2016  . OBESITY 06/04/2009  . Onychomycosis 10/27/2015  . Osteomyelitis of left foot ([place]) 05/29/2015  . Pulmonary sarcoidosis ([place])    Followed locally by pulmonology, but also by Dr. Casper Harrison at [facility]  . Right knee pain 01/26/2017  . Vocal cord dysfunction   . Wears partial dentures     Social History   Social History  . Marital status: Married    Spouse name: LAQUINDA MOLLER  . Number of children: 2  . Years of education: 12   Occupational History  . DISABLED    Social History Main Topics  . Smoking status: Never Smoker  . Smokeless tobacco: Never Used  . Alcohol use No  . Drug use: No  . Sexual activity: Yes    Birth control/ protection: Surgical   Other Topics Concern  . Not on file   Social History Narrative   Married 1994. 2 sons who both live close and 1 grandson.       Disability due to sarcoidosis. 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 ABDOMINAL HYSTERECTOMY    . APPENDECTOMY    . BLADDER SUSPENSION  11/11/2011   Procedure: TRANSVAGINAL TAPE (TVT) PROCEDURE;  Surgeon:  Olga Millers, MD;  Location: [facility];  Service: Gynecology;  Laterality: N/A;  . CARDIAC CATHETERIZATION  07/2010   LVEF 50-55% WITH VERY MILD GLOBAL HYPOKINESIA; ESSENTIALLY NORMAL CORONARY ARTERIES; NORMAL LV FUNCTION  . CAROTIDS  02/18/11   CAROTID DUPLEX; VERTEBRALS ARE PATENT WITH ANTEGRADE FLOW. ICA/CCA RATIO 1.61 ON RIGHT AND 0.75 ON LEFT  . CHOLECYSTECTOMY  1984  . CYSTOSCOPY  11/11/2011   Procedure: CYSTOSCOPY;   Surgeon: Olga Millers, MD;  Location: [facility];  Service: Gynecology;  Laterality: N/A;  . DOPPLER ECHOCARDIOGRAPHY  02/12/2013   LV FUNCTION, SIZE NORMAL; MILD CONCENTRIC LVH; EST EF 55-65%; WALL MOTION NORMAL  . FRACTURE SURGERY     foot  . HERNIA REPAIR    . I&D EXTREMITY Left 06/27/2015   Procedure: Partial Excision Left Calcaneus, Place Antibiotic Beads, and Wound VAC;  Surgeon: Newt Minion, MD;  Location: [facility];  Service: Orthopedics;  Laterality: Left;  . KNEE ARTHROSCOPY     right  . LEFT AND RIGHT HEART CATHETERIZATION WITH CORONARY ANGIOGRAM N/A 04/23/2013   Procedure: LEFT AND RIGHT HEART CATHETERIZATION WITH CORONARY ANGIOGRAM;  Surgeon: Leonie Man, MD;  Location: [facility] CATH LAB;  Service: Cardiovascular;  Laterality: N/A;  . LEFT HEART CATH AND CORONARY ANGIOGRAPHY N/A 03/11/2017   Procedure: Left Heart Cath and Coronary Angiography;  Surgeon: Sherren Mocha, MD;  Location: [place] CV LAB;  Service: Cardiovascular;  Laterality: N/A;  . LexiScan Myoview  03/09/2013   EF 50%; NORMAL MYOCARDIAL PERFUSION STUDY - breast attenuation  . METATARSAL OSTEOTOMY WITH OPEN REDUCTION INTERNAL FIXATION (ORIF) METATARSAL WITH FUSION Left 04/09/2014   Procedure: LEFT FOOT FRACTURE OPEN TREATMENT METATARSAL INCLUDES INTERNAL FIXATION EACH;  Surgeon: Lorn Junes, MD;  Location: [facility];  Service: Orthopedics;  Laterality: Left;  . NISSEN FUNDOPLICATION  1884  . Right and left CARDIAC CATHETERIZATION  04/23/2013   Angiographic normal coronaries; LVEDP 20 mmHg, PCWP 12-14 mmHg, RAP 12 mmHg.; Fick CO/CI 4.9/2.2  . TUBAL LIGATION     with reversal in 1994  . VENTRAL HERNIA REPAIR      Family History  Problem Relation Age of Onset  . Diabetes Father   . Heart attack Father 33  . Coronary artery disease Father   . Heart failure Father   . COPD Mother   . Emphysema Mother   . Asthma Mother   . Heart failure Mother   . Heart attack Maternal Grandfather   . Sarcoidosis  Maternal Uncle   . Lung cancer Brother   . Diabetes Brother   . Colon cancer Neg Hx     Allergies  Allergen Reactions  . Methotrexate Other (See Comments)    Peri-oral and buccal lesions  . Vancomycin Other (See Comments)    Nephrotoxicity   . Clindamycin/Lincomycin Nausea And Vomiting and Rash  . Chlorhexidine Itching  . Doxycycline Rash  . Lisinopril Cough  . Teflaro [Ceftaroline] Rash    Current Outpatient Prescriptions on File Prior to Visit  Medication Sig Dispense Refill  . albuterol (PROVENTIL HFA;VENTOLIN HFA) 108 (90 BASE) MCG/ACT inhaler Inhale 1-2 puffs into the lungs every 6 (six) hours as needed for wheezing or shortness of breath. 8 g 5  . allopurinol (ZYLOPRIM) 100 MG tablet Take 100 mg by mouth at bedtime.     [name] aspirin EC 81 MG tablet Take 81 mg by mouth daily.     [name] atorvastatin (LIPITOR) 40 MG tablet TAKE 1 TAB BY MOUTH ONCE DAILY  90 tablet 3  . BAYER MICROLET LANCETS lancets Use as instructed to check sugar 3 times daily 300 each 5  . benzonatate (TESSALON) 100 MG capsule Take 100 mg by mouth 3 (three) times daily as needed for cough. Reported on 01/05/2016    . Blood Glucose Monitoring Suppl (BAYER CONTOUR MONITOR) w/Device KIT Use daily to check sugar 1 kit 0  . buPROPion (WELLBUTRIN XL) 300 MG 24 hr tablet TAKE 1 TABLET (300 MG TOTAL) BY MOUTH DAILY. 90 tablet 3  . carvedilol (COREG) 25 MG tablet Take 1 tablet (25 mg total) by mouth 2 (two) times daily with a meal. 180 tablet 3  . DEXILANT 60 MG capsule TAKE 1 CAPSULE BY MOUTH DAILY 30 capsule 1  . diclofenac sodium (VOLTAREN) 1 % GEL APPLY 2 GRAMS TOPICALLY 4 TIMES A DAY 100 g 0  . diltiazem (CARDIZEM CD) 120 MG 24 hr capsule Take 1 capsule (120 mg total) by mouth daily. 90 capsule 3  . estradiol (ESTRACE) 2 MG tablet Take 2 mg by mouth at bedtime.     . fenofibrate (TRICOR) 145 MG tablet TAKE 1 TABLET (145 MG TOTAL) BY MOUTH DAILY. 30 tablet 6  . fluticasone (FLONASE) 50 MCG/ACT nasal spray Place 2 sprays  into both nostrils daily. 48 g 3  . furosemide (LASIX) 40 MG tablet TAKE 1 TABLET BY MOUTH TWICE A DAY 60 tablet 11  . gabapentin (NEURONTIN) 100 MG capsule TAKE 1 CAPSULE BY MOUTH 3 TIMES DAILY 90 capsule 0  . glucose blood (BAYER CONTOUR TEST) test strip Use as instructed to check sugar 3 times daily 300 each 5  . glyBURIDE (DIABETA) 2.5 MG tablet Take 0.5 tablets (1.25 mg total) by mouth at bedtime. 15 tablet 2  . insulin aspart (NOVOLOG FLEXPEN) 100 UNIT/ML FlexPen Inject 19-24 Units into the skin 3 (three) times daily with meals. 30 mL 5  . Insulin Pen Needle 33G X 4 MM MISC 1 each by Does not apply route 4 (four) times daily. 200 each 11  . LORazepam (ATIVAN) 1 MG tablet     . losartan (COZAAR) 50 MG tablet TAKE 1 TABLET (50 MG TOTAL) BY MOUTH DAILY.  3  . metFORMIN (GLUCOPHAGE) 1000 MG tablet TAKE 1 TABLET BY MOUTH TWICE A DAY WITH FOOD 180 tablet 0  . mometasone-formoterol (DULERA) 200-5 MCG/ACT AERO Inhale 2 puffs into the lungs 2 (two) times daily. 13 g 5  . nitroGLYCERIN (NITROSTAT) 0.4 MG SL tablet Place 1 tablet (0.4 mg total) under the tongue every 5 (five) minutes as needed for chest pain. Reported on 01/05/2016 100 tablet 0  . ondansetron (ZOFRAN-ODT) 4 MG disintegrating tablet Take 4 mg by mouth every 8 (eight) hours as needed for nausea or vomiting. Reported on 01/05/2016  0  . orlistat (XENICAL) 120 MG capsule Take 1 capsule (120 mg total) by mouth 3 (three) times daily with meals. 90 capsule 2  . PARoxetine Mesylate (BRISDELLE) 7.5 MG CAPS Take 7.5 mg by mouth daily.    . Potassium Chloride ER 20 MEQ TBCR Take 1 tablet by mouth 3 (three) times daily. 90 tablet 3  . prednisoLONE 5 MG TABS tablet Take 5 mg by mouth daily.    Nelva Nay SOLOSTAR 300 UNIT/ML SOPN INJECT 40 UNITS INTO THE SKIN AT BEDTIME 15 pen 1  . traMADol (ULTRAM) 50 MG tablet Take 1 tablet (50 mg total) by mouth every 12 (twelve) hours as needed for moderate pain or severe pain. 30 tablet 2  .  venlafaxine XR  (EFFEXOR-XR) 75 MG 24 hr capsule Take 2 capsules (150 mg total) by mouth at bedtime.    . vitamin B-12 (CYANOCOBALAMIN) 1000 MCG tablet Take 1,000 mcg by mouth daily.     No current facility-administered medications on file prior to visit.     BP 122/60 (BP Location: Left Arm)   Temp 98.6 F (37 C) (Oral)   Wt 256 lb (116.1 kg)   BMI 38.92 kg/m       Objective:   Physical Exam  Constitutional: She is oriented to person, place, and time. She appears well-developed and well-nourished. No distress.  HENT:  Right Ear: Hearing, tympanic membrane, external ear and ear canal normal.  Left Ear: Hearing, tympanic membrane, external ear and ear canal normal.  Nose: Mucosal edema present. No rhinorrhea. Right sinus exhibits maxillary sinus tenderness and frontal sinus tenderness. Left sinus exhibits maxillary sinus tenderness and frontal sinus tenderness.  Mouth/Throat: Uvula is midline and oropharynx is clear and moist.  Eyes: Pupils are equal, round, and reactive to light. Conjunctivae and EOM are normal. Right eye exhibits no discharge. Left eye exhibits no discharge.  Cardiovascular: Normal rate, regular rhythm, normal heart sounds and intact distal pulses.  Exam reveals no gallop.   No murmur heard. Pulmonary/Chest: Effort normal and breath sounds normal. No respiratory distress. She has no wheezes. She has no rales. She exhibits no tenderness.  Abdominal: Soft. Bowel sounds are normal. She exhibits no distension and no mass. There is tenderness (right flank ). There is CVA tenderness (right sided). There is no rebound and no guarding.  Neurological: She is alert and oriented to person, place, and time.  Skin: Skin is warm and dry. No rash noted. She is not diaphoretic. No erythema. No pallor.  Psychiatric: She has a normal mood and affect. Her behavior is normal. Judgment and thought content normal.  Nursing note and vitals reviewed.      Assessment & Plan:  1. Acute cystitis without  hematuria - POC Urinalysis Dipstick- + 3 leuks.  - Urine Culture - amoxicillin-clavulanate (AUGMENTIN) 875-125 MG tablet; Take 1 tablet by mouth 2 (two) times daily.  Dispense: 20 tablet; Refill: 0  2. Acute non-recurrent pansinusitis  - amoxicillin-clavulanate (AUGMENTIN) 875-125 MG tablet; Take 1 tablet by mouth 2 (two) times daily.  Dispense: 20 tablet; Refill: 0  Dorothyann Peng, NP

## 2017-06-02 LAB — URINE CULTURE

## 2017-06-06 ENCOUNTER — Ambulatory Visit (INDEPENDENT_AMBULATORY_CARE_PROVIDER_SITE_OTHER): Payer: BLUE CROSS/BLUE SHIELD | Admitting: Orthopedic Surgery

## 2017-06-06 ENCOUNTER — Ambulatory Visit (INDEPENDENT_AMBULATORY_CARE_PROVIDER_SITE_OTHER): Payer: Self-pay

## 2017-06-06 ENCOUNTER — Encounter (INDEPENDENT_AMBULATORY_CARE_PROVIDER_SITE_OTHER): Payer: Self-pay | Admitting: Orthopedic Surgery

## 2017-06-06 DIAGNOSIS — M6701 Short Achilles tendon (acquired), right ankle: Secondary | ICD-10-CM

## 2017-06-06 DIAGNOSIS — S93411A Sprain of calcaneofibular ligament of right ankle, initial encounter: Secondary | ICD-10-CM | POA: Insufficient documentation

## 2017-06-06 DIAGNOSIS — M25571 Pain in right ankle and joints of right foot: Secondary | ICD-10-CM | POA: Diagnosis not present

## 2017-06-06 NOTE — Progress Notes (Signed)
Office Visit Note   Patient: [name]           Date of Birth: 1961/04/24           MRN: [id number] Visit Date: 06/06/2017              Requested by: [name], MD [address] [place], [place] [address] PCP: [name], MD  Chief Complaint  Patient presents with  . Right Ankle - Follow-up      HPI: Patient presents in follow-up for Achilles tendon contraction of the right as well as a recent fall. The patient states that she had a twisting injury to her right ankle and went to the urgent care center radiographs were negative for fracture. Patient was placed in a fracture boot.  Assessment & Plan: Visit Diagnoses:  1. Pain in right ankle and joints of right foot   2. Sprain of calcaneofibular ligament of right ankle, initial encounter     Plan: She will wear the fracture boot for at least 4 weeks weaning out of the boot as she feels comfortable. Patient is scheduled for a right total knee arthroplasty in September.  Follow-Up Instructions: Return if symptoms worsen or fail to improve.   Ortho Exam  Patient is alert, oriented, no adenopathy, well-dressed, normal affect, normal respiratory effort. Examination patient has an antalgic gait. She has a good pulse her foot is plantar grade she has no tenderness to palpation of the distal tibia or fibula. She is point tender to palpation over the anterior talofibular ligament. Anterior drawer is stable she has bruising over the lateral aspect of her ankle with swelling.  Imaging: No results found. No images are attached to the encounter.  Labs: Lab Results  Component Value Date   HGBA1C 8.9 (H) 03/09/2017   HGBA1C 8.7 09/29/2016   HGBA1C 7.5 01/15/2016   ESRSEDRATE 5 01/26/2017   ESRSEDRATE 27 10/14/2016   ESRSEDRATE 38 (H) 01/26/2016   CRP 17.3 (H) 01/26/2017   CRP 24.0 (H) 10/14/2016   CRP 2.5 (H) 01/26/2016   LABURIC 5.1 01/26/2017   REPTSTATUS 10/14/2015 FINAL 10/14/2015   REPTSTATUS  10/16/2015 FINAL 10/14/2015   GRAMSTAIN  10/14/2015    MODERATE WBC PRESENT,BOTH PMN AND MONONUCLEAR FEW SQUAMOUS EPITHELIAL CELLS PRESENT ABUNDANT GRAM POSITIVE COCCI IN PAIRS IN CLUSTERS Performed at [facility]  10/14/2015    NORMAL OROPHARYNGEAL FLORA Performed at [facility] ([place]) ISOLATED 06/01/2017    Orders:  Orders Placed This Encounter  Procedures  . XR Ankle Complete Right   No orders of the defined types were placed in this encounter.    Procedures: No procedures performed  Clinical Data: No additional findings.  ROS:  All other systems negative, except as noted in the HPI. Review of Systems  Objective: Vital Signs: There were no vitals taken for this visit.  Specialty Comments:  No specialty comments available.  PMFS History: Patient Active Problem List   Diagnosis Date Noted  . Sprain of calcaneofibular ligament of right ankle 06/06/2017  . Acute left ankle pain 01/26/2017  . Osteoarthritis of right knee 01/26/2017  . Multiple allergies 10/14/2016  . Onychomycosis 10/27/2015  . Osteomyelitis of left foot ([place]) 05/29/2015  . CKD (chronic kidney disease) stage 3, GFR 30-59 ml/min 04/27/2015  . MRSA (methicillin resistant staph aureus) culture positive 03/27/2015  . Wound infection complicating hardware ([place]) 03/27/2015  . Fatty liver 09/30/2014  .  Hot flashes 07/15/2014  . Abnormal SPEP 04/17/2014  . Fracture of left leg 04/17/2014  . Cushingoid side effect of steroids ([place]) 04/17/2014  . Internal hemorrhoids   . Depression   . Preoperative clearance 03/25/2014  . Obstructive chronic bronchitis without exacerbation ([place]) 09/18/2013  . Chest pain 04/11/2013  . Hypertensive heart disease with chronic diastolic congestive heart failure ([place])   . Solitary pulmonary nodule, on CT 02/2013 - stable over 2 years in 2015 02/20/2013  . Gout 08/20/2010  . Anemia 09/18/2009  . Morbid obesity  ([place]) 06/04/2009  . Sleep apnea 04/21/2009  . Sarcoidosis of lung ([place]) 04/10/2007  . Hyperlipidemia 08/21/2006  . Essential hypertension 08/21/2006  . GERD 08/21/2006  . Type II diabetes mellitus with neurological manifestations, uncontrolled ([place]) 08/21/2006   Past Medical History:  Diagnosis Date  . Abnormal SPEP 04/17/2014  . Acute left ankle pain 01/26/2017  . ANEMIA-UNSPECIFIED 09/18/2009  . Chronic diastolic heart failure, NYHA class 2 ([facility])    LVEDP roughly 20% by cath  . COPD (chronic obstructive pulmonary disease) ([place])   . Depression   . DIABETES MELLITUS, TYPE II 08/21/2006  . Diabetic osteomyelitis ([place]) 05/29/2015  . Fracture of 5th metatarsal    non union  . GERD 08/21/2006  . GOUT 08/20/2010  . Hx of umbilical hernia repair   . HYPERLIPIDEMIA 08/21/2006  . HYPERTENSION 08/21/2006  . Infection of wound due to methicillin resistant Staphylococcus aureus (MRSA)   . Internal hemorrhoids   . Multiple allergies 10/14/2016  . OBESITY 06/04/2009  . Onychomycosis 10/27/2015  . Osteomyelitis of left foot ([place]) 05/29/2015  . Pulmonary sarcoidosis ([place])    Followed locally by pulmonology, but also by Dr. Casper Harrison at [facility]  . Right knee pain 01/26/2017  . Vocal cord dysfunction   . Wears partial dentures     Family History  Problem Relation Age of Onset  . Diabetes Father   . Heart attack Father 42  . Coronary artery disease Father   . Heart failure Father   . COPD Mother   . Emphysema Mother   . Asthma Mother   . Heart failure Mother   . Heart attack Maternal Grandfather   . Sarcoidosis Maternal Uncle   . Lung cancer Brother   . Diabetes Brother   . Colon cancer Neg Hx     Past Surgical History:  Procedure Laterality Date  . ABDOMINAL HYSTERECTOMY    . APPENDECTOMY    . BLADDER SUSPENSION  11/11/2011   Procedure: TRANSVAGINAL TAPE (TVT) PROCEDURE;  Surgeon: Olga Millers, MD;  Location: [facility];  Service: Gynecology;  Laterality: N/A;  . CARDIAC  CATHETERIZATION  07/2010   LVEF 50-55% WITH VERY MILD GLOBAL HYPOKINESIA; ESSENTIALLY NORMAL CORONARY ARTERIES; NORMAL LV FUNCTION  . CAROTIDS  02/18/11   CAROTID DUPLEX; VERTEBRALS ARE PATENT WITH ANTEGRADE FLOW. ICA/CCA RATIO 1.61 ON RIGHT AND 0.75 ON LEFT  . CHOLECYSTECTOMY  1984  . CYSTOSCOPY  11/11/2011   Procedure: CYSTOSCOPY;  Surgeon: Olga Millers, MD;  Location: [facility];  Service: Gynecology;  Laterality: N/A;  . DOPPLER ECHOCARDIOGRAPHY  02/12/2013   LV FUNCTION, SIZE NORMAL; MILD CONCENTRIC LVH; EST EF 55-65%; WALL MOTION NORMAL  . FRACTURE SURGERY     foot  . HERNIA REPAIR    . I&D EXTREMITY Left 06/27/2015   Procedure: Partial Excision Left Calcaneus, Place Antibiotic Beads, and Wound VAC;  Surgeon: Newt Minion, MD;  Location: [facility];  Service: Orthopedics;  Laterality: Left;  .  KNEE ARTHROSCOPY     right  . LEFT AND RIGHT HEART CATHETERIZATION WITH CORONARY ANGIOGRAM N/A 04/23/2013   Procedure: LEFT AND RIGHT HEART CATHETERIZATION WITH CORONARY ANGIOGRAM;  Surgeon: Leonie Man, MD;  Location: [facility] CATH LAB;  Service: Cardiovascular;  Laterality: N/A;  . LEFT HEART CATH AND CORONARY ANGIOGRAPHY N/A 03/11/2017   Procedure: Left Heart Cath and Coronary Angiography;  Surgeon: Sherren Mocha, MD;  Location: [facility] CV LAB;  Service: Cardiovascular;  Laterality: N/A;  . LexiScan Myoview  03/09/2013   EF 50%; NORMAL MYOCARDIAL PERFUSION STUDY - breast attenuation  . METATARSAL OSTEOTOMY WITH OPEN REDUCTION INTERNAL FIXATION (ORIF) METATARSAL WITH FUSION Left 04/09/2014   Procedure: LEFT FOOT FRACTURE OPEN TREATMENT METATARSAL INCLUDES INTERNAL FIXATION EACH;  Surgeon: Lorn Junes, MD;  Location: [facility];  Service: Orthopedics;  Laterality: Left;  . NISSEN FUNDOPLICATION  6244  . Right and left CARDIAC CATHETERIZATION  04/23/2013   Angiographic normal coronaries; LVEDP 20 mmHg, PCWP 12-14 mmHg, RAP 12 mmHg.; Fick CO/CI 4.9/2.2  . TUBAL LIGATION     with  reversal in 1994  . VENTRAL HERNIA REPAIR     Social History   Occupational History  . DISABLED    Social History Main Topics  . Smoking status: Never Smoker  . Smokeless tobacco: Never Used  . Alcohol use No  . Drug use: No  . Sexual activity: Yes    Birth control/ protection: Surgical

## 2017-06-14 ENCOUNTER — Telehealth: Payer: Self-pay | Admitting: Family Medicine

## 2017-06-14 MED ORDER — FLUCONAZOLE 150 MG PO TABS: 150.0000 mg | ORAL_TABLET | Freq: Once | ORAL | 0 refills | Status: AC

## 2017-06-14 NOTE — Telephone Encounter (Signed)
Sent in diflucan x1. Please inform her

## 2017-06-14 NOTE — Telephone Encounter (Signed)
Called and left a voicemail message asking for a return phone call 

## 2017-06-14 NOTE — Telephone Encounter (Signed)
° ° °  Pt said Tommi Rumps put her on and antibiotic and now she has a yeast infection and is asking if something can be called in for her   Pharmacy [name] on [address]

## 2017-06-16 ENCOUNTER — Ambulatory Visit (INDEPENDENT_AMBULATORY_CARE_PROVIDER_SITE_OTHER): Payer: BLUE CROSS/BLUE SHIELD | Admitting: Infectious Disease

## 2017-06-16 ENCOUNTER — Encounter: Payer: Self-pay | Admitting: Infectious Disease

## 2017-06-16 VITALS — BP 128/64 | HR 84 | Temp 97.3°F | Wt 249.0 lb

## 2017-06-16 DIAGNOSIS — M869 Osteomyelitis, unspecified: Secondary | ICD-10-CM

## 2017-06-16 DIAGNOSIS — Z889 Allergy status to unspecified drugs, medicaments and biological substances status: Secondary | ICD-10-CM

## 2017-06-16 DIAGNOSIS — M1A4720 Other secondary chronic gout, left ankle and foot, without tophus (tophi): Secondary | ICD-10-CM

## 2017-06-16 DIAGNOSIS — M86672 Other chronic osteomyelitis, left ankle and foot: Secondary | ICD-10-CM | POA: Diagnosis not present

## 2017-06-16 DIAGNOSIS — E1169 Type 2 diabetes mellitus with other specified complication: Secondary | ICD-10-CM | POA: Diagnosis not present

## 2017-06-16 DIAGNOSIS — D869 Sarcoidosis, unspecified: Secondary | ICD-10-CM

## 2017-06-16 NOTE — Progress Notes (Signed)
Chief complaint: followup for calcaneal osteomyelitis  Subjective:    Patient ID: [name], female    DOB: 07-May-1961, 56 y.o.   MRN: [id number]  HPI  56 year old female with multiple medical problems who initially had a fracture of her 5th metatarsal and developed non union. Underwent ORIF with bone grafting but then fell and broke the 5th metatarsal again. She underwent hardware removal and ORIF again  with midfoot reconstruction. The calcaneal osteotomy did not heal well and in May 2016 noted draiange, which she describes as white, pus like drainage. Developed swelling of foot and leg. She was started on Septra DS, 2 pills bid with rifampin though did not resolve. Underwent I and D in [place] with associated hardware removal but presented here to Dr. Percell Miller with persistent drainage. She underwent I and D and superficial swab with MRSA and CoNS, no bone culture done. She was started on and has continued with vancomycin and rifampin. Unfortunately, she developed AKI and was reshospitalized. Her creat was increased to 1.87 and vancomycin stopped and started linezolid. Her creat remained stable, though still up from her baseline at 1.68 after her visit to Dr. Yong Channel. She was sent out on doxycyline after her insurace refused linezolid coverage. She developed a rash with this but continued the doxycyline, until seen by Dr. Linus Salmons roughly 16 days ago. She was taken off doxycycline but since has seen Dr. Para March who is concerned that the patient had worsening left heel pain which is hard for her now to bear. Patient had labs done which showed an elevated sedimentation rate above 50. 4 to layer creatinine had now improved to 1.35. See labs of lobe picture.  She had  been restarted on doxycycline and has had re-emergence of her rash is intensely pruritic  When I later saw  her I was concerned for worsening osteomyelitis and obtained MRI of foot on 06/03/15 which showed:  IMPRESSION: 1.  There is evidence of prior calcaneal osteotomy of the left posterior calcaneus with hardware removal. There is a soft tissue wound along the posterior lateral aspect of the left hindfoot extending to the lateral osteotomy site of the calcaneus. There is soft tissue enhancement and osseous enhancement with mild cortical irregularity along the lateral aspect of the calcaneal osteotomy site which may reflect postsurgical granulation tissue, but osteomyelitis cannot be completely excluded. No drainable fluid collection.  She was seen b y Dr. Sharol Given with orthopedic surgery. I had stopped her doxycyline in hopes that we might obtain helpful cultures in the OR to guide therapy if she underwent surgery   She  was seen by Dr Sharol Given who restarted her oral doxycycline and then admitted her to [facility] where he performed Partial Excision Left Calcaneus, Place Antibiotic Beads, and Wound VAC on 06/27/15. The patient states that she has not been on ANY antibiotics postoperatively whatsoever. Her cultures on doxycycline did not yield any organism. She has NOT had worsening of pain or drainage since her surgery which was a month prior to my seeing her again. I DID propose however that she undergo course of rx with IV antibiotics to maximize her chances of control, cure of this calcaneal osteomyelitis and we started IV daptomicin to which she apparently developed a rash. This was stopped and she was changed to IV teflaro which she also developed a rash. She was NOT able to get through the 8 weeks of IV abx we had planned instead roughly 32 days of IV antibiotics. We  did not switch her to an oral antibiotic due to her multiple allergies.   Since coming off antibiotics she had experienced worsening pain in her heel both with weight bearing and at rest. No nausea vomiting, fevers or systemic symptoms.  Her inflammatory markers were up and we were worried and therefore he obtained an MRI in mid December which  showed:   IMPRESSION: 1. Interval progressive healing of calcaneal osteotomy. No evidence of osteomyelitis. 2. The lateral hindfoot soft tissue wound/tract demonstrates mild contraction. No evidence of soft tissue abscess. 3. Peroneal tenosynovitis and subcutaneous edema in the distal lower leg have mildly worsened.   She has had interval improvement in her wound and pain in her heel. She did have an admission to the hospital for a flare of her sarcoidosis that is not just involving her lungs but her eyes she is going to [facility] to see Dr. Casper Harrison for follow-up for this. She is also being seen by Dr. Prudencio Burly here in [place] with regards to the ophthalmic involvement.  Her inflammatory markers were elevated when she was inpatient but was  concerned well be due to her sarcoid.  She had been off antibiotics for some time and continues to be off antibiotics for heel pain has improved.  We did recheck ESR which was normal but CRP was up.  She had done well as far as her foot is concerned.   She remained off of antibiotics since before her visit with me in April she was treated for urinary tract infection with antibiotics about 3 weeks ago but has not been on an biotic starting her calcaneus anymore. She is followed closely with Dr. Sharol Given. She is also going to have a knee replacement done soon.  Foot is without significant pain.     Past Medical History:  Diagnosis Date  . Abnormal SPEP 04/17/2014  . Acute left ankle pain 01/26/2017  . ANEMIA-UNSPECIFIED 09/18/2009  . Chronic diastolic heart failure, NYHA class 2 (HCC)    LVEDP roughly 20% by cath  . COPD (chronic obstructive pulmonary disease) ([place])   . Depression   . DIABETES MELLITUS, TYPE II 08/21/2006  . Diabetic osteomyelitis ([place]) 05/29/2015  . Fracture of 5th metatarsal    non union  . GERD 08/21/2006  . GOUT 08/20/2010  . Hx of umbilical hernia repair   . HYPERLIPIDEMIA 08/21/2006  . HYPERTENSION 08/21/2006  .  Infection of wound due to methicillin resistant Staphylococcus aureus (MRSA)   . Internal hemorrhoids   . Multiple allergies 10/14/2016  . OBESITY 06/04/2009  . Onychomycosis 10/27/2015  . Osteomyelitis of left foot ([place]) 05/29/2015  . Pulmonary sarcoidosis ([place])    Followed locally by pulmonology, but also by Dr. Casper Harrison at [facility]  . Right knee pain 01/26/2017  . Vocal cord dysfunction   . Wears partial dentures     Past Surgical History:  Procedure Laterality Date  . ABDOMINAL HYSTERECTOMY    . APPENDECTOMY    . BLADDER SUSPENSION  11/11/2011   Procedure: TRANSVAGINAL TAPE (TVT) PROCEDURE;  Surgeon: Olga Millers, MD;  Location: [facility];  Service: Gynecology;  Laterality: N/A;  . CARDIAC CATHETERIZATION  07/2010   LVEF 50-55% WITH VERY MILD GLOBAL HYPOKINESIA; ESSENTIALLY NORMAL CORONARY ARTERIES; NORMAL LV FUNCTION  . CAROTIDS  02/18/11   CAROTID DUPLEX; VERTEBRALS ARE PATENT WITH ANTEGRADE FLOW. ICA/CCA RATIO 1.61 ON RIGHT AND 0.75 ON LEFT  . CHOLECYSTECTOMY  1984  . CYSTOSCOPY  11/11/2011   Procedure: CYSTOSCOPY;  Surgeon:  Olga Millers, MD;  Location: [facility];  Service: Gynecology;  Laterality: N/A;  . DOPPLER ECHOCARDIOGRAPHY  02/12/2013   LV FUNCTION, SIZE NORMAL; MILD CONCENTRIC LVH; EST EF 55-65%; WALL MOTION NORMAL  . FRACTURE SURGERY     foot  . HERNIA REPAIR    . I&D EXTREMITY Left 06/27/2015   Procedure: Partial Excision Left Calcaneus, Place Antibiotic Beads, and Wound VAC;  Surgeon: Newt Minion, MD;  Location: [facility];  Service: Orthopedics;  Laterality: Left;  . KNEE ARTHROSCOPY     right  . LEFT AND RIGHT HEART CATHETERIZATION WITH CORONARY ANGIOGRAM N/A 04/23/2013   Procedure: LEFT AND RIGHT HEART CATHETERIZATION WITH CORONARY ANGIOGRAM;  Surgeon: Leonie Man, MD;  Location: [facility] CATH LAB;  Service: Cardiovascular;  Laterality: N/A;  . LEFT HEART CATH AND CORONARY ANGIOGRAPHY N/A 03/11/2017   Procedure: Left Heart Cath and Coronary Angiography;  Surgeon:  Sherren Mocha, MD;  Location: [facility];  Service: Cardiovascular;  Laterality: N/A;  . LexiScan Myoview  03/09/2013   EF 50%; NORMAL MYOCARDIAL PERFUSION STUDY - breast attenuation  . METATARSAL OSTEOTOMY WITH OPEN REDUCTION INTERNAL FIXATION (ORIF) METATARSAL WITH FUSION Left 04/09/2014   Procedure: LEFT FOOT FRACTURE OPEN TREATMENT METATARSAL INCLUDES INTERNAL FIXATION EACH;  Surgeon: Lorn Junes, MD;  Location: [facility];  Service: Orthopedics;  Laterality: Left;  . NISSEN FUNDOPLICATION  5053  . Right and left CARDIAC CATHETERIZATION  04/23/2013   Angiographic normal coronaries; LVEDP 20 mmHg, PCWP 12-14 mmHg, RAP 12 mmHg.; Fick CO/CI 4.9/2.2  . TUBAL LIGATION     with reversal in 1994  . VENTRAL HERNIA REPAIR      Family History  Problem Relation Age of Onset  . Diabetes Father   . Heart attack Father 45  . Coronary artery disease Father   . Heart failure Father   . COPD Mother   . Emphysema Mother   . Asthma Mother   . Heart failure Mother   . Heart attack Maternal Grandfather   . Sarcoidosis Maternal Uncle   . Lung cancer Brother   . Diabetes Brother   . Colon cancer Neg Hx       Social History   Social History  . Marital status: Married    Spouse name: GISSELLE GALVIS  . Number of children: 2  . Years of education: 12   Occupational History  . DISABLED    Social History Main Topics  . Smoking status: Never Smoker  . Smokeless tobacco: Never Used  . Alcohol use No  . Drug use: No  . Sexual activity: Yes    Birth control/ protection: Surgical   Other Topics Concern  . Not on file   Social History Narrative   Married 1994. 2 sons who both live close and 1 grandson.       Disability due to sarcoidosis. 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  Methotrexate Other (See Comments)    Peri-oral and buccal lesions  . Vancomycin Other (See Comments)    Nephrotoxicity   . Clindamycin/Lincomycin Nausea And Vomiting and Rash  . Chlorhexidine Itching  . Doxycycline Rash  . Lisinopril Cough  . Teflaro [Ceftaroline] Rash     Current Outpatient Prescriptions:  .  albuterol (PROVENTIL HFA;VENTOLIN HFA) 108 (90 BASE)  MCG/ACT inhaler, Inhale 1-2 puffs into the lungs every 6 (six) hours as needed for wheezing or shortness of breath., Disp: 8 g, Rfl: 5 .  allopurinol (ZYLOPRIM) 100 MG tablet, Take 100 mg by mouth at bedtime. , Disp: , Rfl:  .  amoxicillin-clavulanate (AUGMENTIN) 875-125 MG tablet, Take 1 tablet by mouth 2 (two) times daily., Disp: 20 tablet, Rfl: 0 .  aspirin EC 81 MG tablet, Take 81 mg by mouth daily. , Disp: , Rfl:  .  atorvastatin (LIPITOR) 40 MG tablet, TAKE 1 TAB BY MOUTH ONCE DAILY, Disp: 90 tablet, Rfl: 3 .  BAYER MICROLET LANCETS lancets, Use as instructed to check sugar 3 times daily, Disp: 300 each, Rfl: 5 .  benzonatate (TESSALON) 100 MG capsule, Take 100 mg by mouth 3 (three) times daily as needed for cough. Reported on 01/05/2016, Disp: , Rfl:  .  Blood Glucose Monitoring Suppl (BAYER CONTOUR MONITOR) w/Device KIT, Use daily to check sugar, Disp: 1 kit, Rfl: 0 .  buPROPion (WELLBUTRIN XL) 300 MG 24 hr tablet, TAKE 1 TABLET (300 MG TOTAL) BY MOUTH DAILY., Disp: 90 tablet, Rfl: 3 .  carvedilol (COREG) 25 MG tablet, Take 1 tablet (25 mg total) by mouth 2 (two) times daily with a meal., Disp: 180 tablet, Rfl: 3 .  DEXILANT 60 MG capsule, TAKE 1 CAPSULE BY MOUTH DAILY, Disp: 30 capsule, Rfl: 1 .  diclofenac sodium (VOLTAREN) 1 % GEL, APPLY 2 GRAMS TOPICALLY 4 TIMES A DAY, Disp: 100 g, Rfl: 0 .  diltiazem (CARDIZEM CD) 120 MG 24 hr capsule, Take 1 capsule (120 mg total) by mouth daily., Disp: 90 capsule, Rfl: 3 .  estradiol (ESTRACE) 2 MG tablet, Take 2 mg by mouth at bedtime. , Disp: , Rfl:  .  fenofibrate (TRICOR) 145 MG  tablet, TAKE 1 TABLET (145 MG TOTAL) BY MOUTH DAILY., Disp: 30 tablet, Rfl: 6 .  fluticasone (FLONASE) 50 MCG/ACT nasal spray, Place 2 sprays into both nostrils daily., Disp: 48 g, Rfl: 3 .  furosemide (LASIX) 40 MG tablet, TAKE 1 TABLET BY MOUTH TWICE A DAY, Disp: 60 tablet, Rfl: 11 .  gabapentin (NEURONTIN) 100 MG capsule, TAKE 1 CAPSULE BY MOUTH 3 TIMES DAILY, Disp: 90 capsule, Rfl: 0 .  glucose blood (BAYER CONTOUR TEST) test strip, Use as instructed to check sugar 3 times daily, Disp: 300 each, Rfl: 5 .  glyBURIDE (DIABETA) 2.5 MG tablet, Take 0.5 tablets (1.25 mg total) by mouth at bedtime., Disp: 15 tablet, Rfl: 2 .  insulin aspart (NOVOLOG FLEXPEN) 100 UNIT/ML FlexPen, Inject 19-24 Units into the skin 3 (three) times daily with meals., Disp: 30 mL, Rfl: 5 .  Insulin Pen Needle 33G X 4 MM MISC, 1 each by Does not apply route 4 (four) times daily., Disp: 200 each, Rfl: 11 .  LORazepam (ATIVAN) 1 MG tablet, , Disp: , Rfl:  .  losartan (COZAAR) 50 MG tablet, TAKE 1 TABLET (50 MG TOTAL) BY MOUTH DAILY., Disp: , Rfl: 3 .  metFORMIN (GLUCOPHAGE) 1000 MG tablet, TAKE 1 TABLET BY MOUTH TWICE A DAY WITH FOOD, Disp: 180 tablet, Rfl: 0 .  mometasone-formoterol (DULERA) 200-5 MCG/ACT AERO, Inhale 2 puffs into the lungs 2 (two) times daily., Disp: 13 g, Rfl: 5 .  nitroGLYCERIN (NITROSTAT) 0.4 MG SL tablet, Place 1 tablet (0.4 mg total) under the tongue every 5 (five) minutes as needed for chest pain. Reported on 01/05/2016, Disp: 100 tablet, Rfl: 0 .  ondansetron (ZOFRAN-ODT) 4 MG disintegrating tablet, Take  4 mg by mouth every 8 (eight) hours as needed for nausea or vomiting. Reported on 01/05/2016, Disp: , Rfl: 0 .  orlistat (XENICAL) 120 MG capsule, Take 1 capsule (120 mg total) by mouth 3 (three) times daily with meals., Disp: 90 capsule, Rfl: 2 .  PARoxetine Mesylate (BRISDELLE) 7.5 MG CAPS, Take 7.5 mg by mouth daily., Disp: , Rfl:  .  Potassium Chloride ER 20 MEQ TBCR, Take 1 tablet by mouth 3 (three)  times daily., Disp: 90 tablet, Rfl: 3 .  prednisoLONE 5 MG TABS tablet, Take 5 mg by mouth daily., Disp: , Rfl:  .  TOUJEO SOLOSTAR 300 UNIT/ML SOPN, INJECT 40 UNITS INTO THE SKIN AT BEDTIME, Disp: 15 pen, Rfl: 1 .  traMADol (ULTRAM) 50 MG tablet, Take 1 tablet (50 mg total) by mouth every 12 (twelve) hours as needed for moderate pain or severe pain., Disp: 30 tablet, Rfl: 2 .  venlafaxine XR (EFFEXOR-XR) 75 MG 24 hr capsule, Take 2 capsules (150 mg total) by mouth at bedtime., Disp: , Rfl:  .  vitamin B-12 (CYANOCOBALAMIN) 1000 MCG tablet, Take 1,000 mcg by mouth daily., Disp: , Rfl:       Review of Systems  Constitutional: Negative for activity change, appetite change, chills, diaphoresis, fatigue, fever and unexpected weight change.  HENT: Negative for congestion, rhinorrhea, sinus pressure, sneezing, sore throat and trouble swallowing.   Eyes: Negative for photophobia and visual disturbance.  Respiratory: Negative for cough, chest tightness, shortness of breath, wheezing and stridor.   Cardiovascular: Negative for chest pain, palpitations and leg swelling.  Gastrointestinal: Negative for abdominal distention, abdominal pain, anal bleeding, blood in stool, constipation, diarrhea, nausea and vomiting.  Genitourinary: Negative for difficulty urinating, dysuria, flank pain and hematuria.  Musculoskeletal: Negative for arthralgias, back pain, gait problem, joint swelling and myalgias.  Skin: Positive for rash. Negative for color change, pallor and wound.  Neurological: Negative for dizziness, tremors, weakness and light-headedness.  Hematological: Negative for adenopathy. Does not bruise/bleed easily.  Psychiatric/Behavioral: Negative for agitation, behavioral problems, confusion, decreased concentration, dysphoric mood and sleep disturbance.       Objective:   Physical Exam  Constitutional: She is oriented to person, place, and time. She appears well-developed and well-nourished. No  distress.  HENT:  Head: Normocephalic and atraumatic.  Mouth/Throat: No oropharyngeal exudate.  Eyes: Conjunctivae and EOM are normal. No scleral icterus.  Neck: Normal range of motion. Neck supple. No JVD present.  Cardiovascular: Normal rate and regular rhythm.   Pulmonary/Chest: Effort normal. No respiratory distress. She has no wheezes.  Abdominal: She exhibits no distension.  Musculoskeletal: She exhibits no edema.       Right knee: Tenderness found.  Neurological: She is alert and oriented to person, place, and time. She exhibits normal muscle tone. Coordination normal.  Skin: Skin is warm and dry. She is not diaphoretic. No erythema.  Psychiatric: She has a normal mood and affect. Her behavior is normal. Judgment and thought content normal.     Left foot 05/29/2015:    Left heel today 01/26/16:       Heel continues to appear well-healed     Left foot 09/11/15: note a new scar from where she dropped a curling iron on her foot       10/14/16:     06/16/17:          Assessment & Plan:   56 year old with calcaneal osteomyelitis and metatarsal infection originally due to fracture also with diabetes mellitus. Above infection was  complicated by presence of hardware. MRI in August showed osteomyelitis persisting sp surgery by Dr Sharol Given with deep cultures (though ON abx) and culture unrevealing. We tried IV abx but only got through 33 of planned 56 days due to rashes to every IV abx we had tried. MRi was reassuring.   Recheck ESR, and CRP keeping in mind Central confounders of gout in sarcoid. She is doing well she can follow-up as needed   Sarcoid: on chronic steroids   Multiple allergies on various antibiotics: wonder if many of these are driven by supratentorial or in fact subconscious suggestion or if she ireally is allergic to all of these different classes of antibiotics

## 2017-06-17 ENCOUNTER — Encounter (HOSPITAL_COMMUNITY)
Admission: RE | Admit: 2017-06-17 | Discharge: 2017-06-17 | Disposition: A | Discharge status: Home / Self Care | Payer: BLUE CROSS/BLUE SHIELD | Source: Ambulatory Visit | Attending: Orthopedic Surgery | Admitting: Orthopedic Surgery

## 2017-06-17 ENCOUNTER — Encounter: Payer: Self-pay | Admitting: Family Medicine

## 2017-06-17 ENCOUNTER — Encounter (HOSPITAL_COMMUNITY): Payer: Self-pay

## 2017-06-17 ENCOUNTER — Ambulatory Visit (INDEPENDENT_AMBULATORY_CARE_PROVIDER_SITE_OTHER): Payer: BLUE CROSS/BLUE SHIELD | Admitting: Family Medicine

## 2017-06-17 VITALS — BP 120/60 | HR 75 | Temp 98.1°F | Ht 66.0 in | Wt 257.1 lb

## 2017-06-17 DIAGNOSIS — J310 Chronic rhinitis: Secondary | ICD-10-CM

## 2017-06-17 DIAGNOSIS — J329 Chronic sinusitis, unspecified: Secondary | ICD-10-CM | POA: Diagnosis not present

## 2017-06-17 DIAGNOSIS — B379 Candidiasis, unspecified: Secondary | ICD-10-CM | POA: Diagnosis not present

## 2017-06-17 DIAGNOSIS — Z01818 Encounter for other preprocedural examination: Secondary | ICD-10-CM | POA: Diagnosis not present

## 2017-06-17 DIAGNOSIS — R300 Dysuria: Secondary | ICD-10-CM

## 2017-06-17 LAB — CBC
HCT: 32 % — ABNORMAL LOW (ref 36.0–46.0)
Hemoglobin: 10 g/dL — ABNORMAL LOW (ref 12.0–15.0)
MCH: 27.3 pg (ref 26.0–34.0)
MCHC: 31.3 g/dL (ref 30.0–36.0)
MCV: 87.4 fL (ref 78.0–100.0)
Platelets: 406 10*3/uL — ABNORMAL HIGH (ref 150–400)
RBC: 3.66 MIL/uL — ABNORMAL LOW (ref 3.87–5.11)
RDW: 13.7 % (ref 11.5–15.5)
WBC: 15.5 10*3/uL — ABNORMAL HIGH (ref 4.0–10.5)

## 2017-06-17 LAB — BASIC METABOLIC PANEL WITH GFR
BUN/Creatinine Ratio: 20 (calc) (ref 6–22)
BUN: 25 mg/dL (ref 7–25)
CO2: 29 mmol/L (ref 20–32)
Calcium: 9.7 mg/dL (ref 8.6–10.4)
Chloride: 96 mmol/L — ABNORMAL LOW (ref 98–110)
Creat: 1.24 mg/dL — ABNORMAL HIGH (ref 0.50–1.05)
GFR, Est African American: 56 mL/min/{1.73_m2} — ABNORMAL LOW (ref >=60)
GFR, Est Non African American: 48 mL/min/{1.73_m2} — ABNORMAL LOW (ref >=60)
Glucose, Bld: 256 mg/dL — ABNORMAL HIGH (ref 65–99)
Potassium: 4.3 mmol/L (ref 3.5–5.3)
Sodium: 137 mmol/L (ref 135–146)

## 2017-06-17 LAB — POCT URINALYSIS DIPSTICK
Bilirubin, UA: NEGATIVE
Glucose, UA: NEGATIVE
Ketones, UA: NEGATIVE
Nitrite, UA: NEGATIVE
Protein, UA: NEGATIVE
Spec Grav, UA: 1.02 (ref 1.010–1.025)
Urobilinogen, UA: 0.2 E.U./dL
pH, UA: 6 (ref 5.0–8.0)

## 2017-06-17 LAB — URINALYSIS, MICROSCOPIC ONLY

## 2017-06-17 LAB — GLUCOSE, CAPILLARY: Glucose-Capillary: 248 mg/dL — ABNORMAL HIGH (ref 65–99)

## 2017-06-17 LAB — SEDIMENTATION RATE: Sed Rate: 28 mm/h (ref 0–30)

## 2017-06-17 LAB — BASIC METABOLIC PANEL
Anion gap: 11 (ref 5–15)
BUN: 25 mg/dL — ABNORMAL HIGH (ref 6–20)
CO2: 29 mmol/L (ref 22–32)
Calcium: 9.6 mg/dL (ref 8.9–10.3)
Chloride: 98 mmol/L — ABNORMAL LOW (ref 101–111)
Creatinine, Ser: 1.34 mg/dL — ABNORMAL HIGH (ref 0.44–1.00)
GFR calc Af Amer: 50 mL/min — ABNORMAL LOW (ref >60)
GFR calc non Af Amer: 43 mL/min — ABNORMAL LOW (ref >60)
Glucose, Bld: 215 mg/dL — ABNORMAL HIGH (ref 65–99)
Potassium: 3.8 mmol/L (ref 3.5–5.1)
Sodium: 138 mmol/L (ref 135–145)

## 2017-06-17 LAB — SURGICAL PCR SCREEN
MRSA, PCR: POSITIVE — AB
Staphylococcus aureus: POSITIVE — AB

## 2017-06-17 LAB — HEMOGLOBIN A1C
Hgb A1c MFr Bld: 7.5 % — ABNORMAL HIGH (ref 4.8–5.6)
Mean Plasma Glucose: 168.55 mg/dL

## 2017-06-17 LAB — C-REACTIVE PROTEIN: CRP: 27.1 mg/L — ABNORMAL HIGH (ref <8.0)

## 2017-06-17 MED ORDER — FLUCONAZOLE 150 MG PO TABS: 150.0000 mg | ORAL_TABLET | Freq: Every day | ORAL | 0 refills | Status: DC

## 2017-06-17 NOTE — Pre-Procedure Instructions (Signed)
[name]  06/17/2017      [name] [name], [place] [address] [place] [address] Phone: [phone] Fax: [phone]  [name] [address] [place], [name] [name] [name] [address] Phone: [phone] Fax: [phone]    Your procedure is scheduled on 06/29/17.  Report to [facility] Admitting at 630 A.M.  Call this number if you have problems the morning of surgery:  [phone]   Remember:  Do not eat food or drink liquids after midnight.  Take these medicines the morning of surgery with A SIP OF WATER --all inhalers,carvedilol,cardizem,neurontin,effexor,ultram   Do not wear jewelry, make-up or nail polish.  Do not wear lotions, powders, or perfumes, or deoderant.  Do not shave 48 hours prior to surgery.  Men may shave face and neck.  Do not bring valuables to the hospital.  [facility] is not responsible for any belongings or valuables.  Contacts, dentures or bridgework may not be worn into surgery.  Leave your suitcase in the car.  After surgery it may be brought to your room.  For patients admitted to the hospital, discharge time will be determined by your treatment team.  Patients discharged the day of surgery will not be allowed to drive home.   Name and phone number of your driver:    Special instructions:  Do not take any aspirin,anti-inflammatories,vitamins,or herbal supplements 5-7 days prior to surgery.  Please read over the following fact sheets that you were given. MRSA Information    How to Manage Your Diabetes Before and After Surgery  Why is it important to control my blood sugar before and after surgery? . Improving blood sugar levels before and after surgery helps healing and can limit problems. . A way of improving blood sugar control is eating a healthy diet by: o  Eating less sugar and carbohydrates o  Increasing activity/exercise o  Talking  with your doctor about reaching your blood sugar goals . High blood sugars (greater than 180 mg/dL) can raise your risk of infections and slow your recovery, so you will need to focus on controlling your diabetes during the weeks before surgery. . Make sure that the doctor who takes care of your diabetes knows about your planned surgery including the date and location.  How do I manage my blood sugar before surgery? . Check your blood sugar at least 4 times a day, starting 2 days before surgery, to make sure that the level is not too high or low. o Check your blood sugar the morning of your surgery when you wake up and every 2 hours until you get to the Short Stay unit. . If your blood sugar is less than 70 mg/dL, you will need to treat for low blood sugar: o Do not take insulin. o Treat a low blood sugar (less than 70 mg/dL) with  cup of clear juice (cranberry or apple), 4 glucose tablets, OR glucose gel. o Recheck blood sugar in 15 minutes after treatment (to make sure it is greater than 70 mg/dL). If your blood sugar is not greater than 70 mg/dL on recheck, call [phone] for further instructions. . Report your blood sugar to the short stay nurse when you get to Short Stay.  . If you are admitted to the hospital after surgery: o Your blood sugar will be checked by the staff and you will probably be given insulin after surgery (instead  of oral diabetes medicines) to make sure you have good blood sugar levels. o The goal for blood sugar control after surgery is 80-180 mg/dL.              WHAT DO I DO ABOUT MY DIABETES MEDICATION?   [name] Do not take oral diabetes medicines (pills) the morning of surgery.  . THE NIGHT BEFORE SURGERY, take ___________ units of ___________insulin.       [name] HE MORNING OF SURGERY, take _____________ units of __________insulin.  . The day of surgery, do not take other diabetes injectables, including Byetta (exenatide), Bydureon (exenatide ER), Victoza  (liraglutide), or Trulicity (dulaglutide).  . If your CBG is greater than 220 mg/dL, you may take  of your sliding scale (correction) dose of insulin.  Other Instructions:          Patient Signature:  Date:   Nurse Signature:  Date:   Reviewed and Endorsed by [facility] Patient Education Committee, August 2015

## 2017-06-17 NOTE — Patient Instructions (Signed)
Start the diflucan and take once daily for 3 days.  Start claritin and take once daily for 1 month  We have ordered labs or studies at this visit. It can take up to 1-2 weeks for results and processing. IF results require follow up or explanation, we will call you with instructions. Clinically stable results will be released to your [facility]. If you have not heard from [place] or cannot find your results in [facility] in 2 weeks please contact our office at [phone].  If you are not yet signed up for [facility], please consider signing up.  Drink plenty of water and avoid sweets, process foods and simple starches.  I hope you are feeling better soon! Seek care immediately if worsening, new concerns or you are not improving with treatment.

## 2017-06-17 NOTE — Telephone Encounter (Signed)
Spoke with patient who states she did pick up medication but she was in the office this morning seeing Dr. Maudie Mercury as she felt like her UTI did not clear up. She states Dr. Maudie Mercury put her on 3 more days of antibiotics

## 2017-06-17 NOTE — Progress Notes (Addendum)
HPI:  Acute visit for several issues: Reports symptoms are mild, but she is going out of town to [place] this weekend, so she wanted to get checked out. She has diabetes, but reports her blood sugars have been well controlled in the 100s on checks at home.  Dysuria: -For about 1 week, but worsening over the last 1 day -Symptoms include frequency, urgency and dysuria -Denies fevers, malaise, flank pain, hematuria, vaginal symptoms - On review of chart she had a strep agalactiae. UTI a few weeks ago, appropriately treated with 10 days of Augmentin  Sinus congestion: -Chronic, for many weeks -Symptoms include nasal congestion, clear nasal discharge, sinus pressure, occasional headaches, drainage in throat, occasional cough -Denies: fevers, chills, shortness of breath, wheezing, nausea, vomiting, ear pain, persistent sinus pain or tooth pain  Yeast infection: -Gets this after taking antibiotics -The Augmentin for 10 days a few weeks ago for a UTI and sinus congestion -Has had some yeast symptoms in the groin area and also in the mouth, tongue hurts at times -Took 1 pill of Diflucan  ROS: See pertinent positives and negatives per HPI.  Past Medical History:  Diagnosis Date  . Abnormal SPEP 04/17/2014  . Acute left ankle pain 01/26/2017  . ANEMIA-UNSPECIFIED 09/18/2009  . Chronic diastolic heart failure, NYHA class 2 (HCC)    LVEDP roughly 20% by cath  . COPD (chronic obstructive pulmonary disease) ([place])   . Depression   . DIABETES MELLITUS, TYPE II 08/21/2006  . Diabetic osteomyelitis ([place]) 05/29/2015  . Fracture of 5th metatarsal    non union  . GERD 08/21/2006  . GOUT 08/20/2010  . Hx of umbilical hernia repair   . HYPERLIPIDEMIA 08/21/2006  . HYPERTENSION 08/21/2006  . Infection of wound due to methicillin resistant Staphylococcus aureus (MRSA)   . Internal hemorrhoids   . Multiple allergies 10/14/2016  . OBESITY 06/04/2009  . Onychomycosis 10/27/2015  . Osteomyelitis of left  foot ([place]) 05/29/2015  . Pulmonary sarcoidosis ([place])    Followed locally by pulmonology, but also by Dr. Casper Harrison at [facility]  . Right knee pain 01/26/2017  . Vocal cord dysfunction   . Wears partial dentures     Past Surgical History:  Procedure Laterality Date  . ABDOMINAL HYSTERECTOMY    . APPENDECTOMY    . BLADDER SUSPENSION  11/11/2011   Procedure: TRANSVAGINAL TAPE (TVT) PROCEDURE;  Surgeon: Olga Millers, MD;  Location: [facility];  Service: Gynecology;  Laterality: N/A;  . CARDIAC CATHETERIZATION  07/2010   LVEF 50-55% WITH VERY MILD GLOBAL HYPOKINESIA; ESSENTIALLY NORMAL CORONARY ARTERIES; NORMAL LV FUNCTION  . CAROTIDS  02/18/11   CAROTID DUPLEX; VERTEBRALS ARE PATENT WITH ANTEGRADE FLOW. ICA/CCA RATIO 1.61 ON RIGHT AND 0.75 ON LEFT  . CHOLECYSTECTOMY  1984  . CYSTOSCOPY  11/11/2011   Procedure: CYSTOSCOPY;  Surgeon: Olga Millers, MD;  Location: [facility];  Service: Gynecology;  Laterality: N/A;  . DOPPLER ECHOCARDIOGRAPHY  02/12/2013   LV FUNCTION, SIZE NORMAL; MILD CONCENTRIC LVH; EST EF 55-65%; WALL MOTION NORMAL  . FRACTURE SURGERY     foot  . HERNIA REPAIR    . I&D EXTREMITY Left 06/27/2015   Procedure: Partial Excision Left Calcaneus, Place Antibiotic Beads, and Wound VAC;  Surgeon: Newt Minion, MD;  Location: [facility];  Service: Orthopedics;  Laterality: Left;  . KNEE ARTHROSCOPY     right  . LEFT AND RIGHT HEART CATHETERIZATION WITH CORONARY ANGIOGRAM N/A 04/23/2013   Procedure: LEFT AND RIGHT HEART CATHETERIZATION  WITH CORONARY ANGIOGRAM;  Surgeon: Leonie Man, MD;  Location: [facility] CATH LAB;  Service: Cardiovascular;  Laterality: N/A;  . LEFT HEART CATH AND CORONARY ANGIOGRAPHY N/A 03/11/2017   Procedure: Left Heart Cath and Coronary Angiography;  Surgeon: Sherren Mocha, MD;  Location: [place] CV LAB;  Service: Cardiovascular;  Laterality: N/A;  . LexiScan Myoview  03/09/2013   EF 50%; NORMAL MYOCARDIAL PERFUSION STUDY - breast attenuation  . METATARSAL  OSTEOTOMY WITH OPEN REDUCTION INTERNAL FIXATION (ORIF) METATARSAL WITH FUSION Left 04/09/2014   Procedure: LEFT FOOT FRACTURE OPEN TREATMENT METATARSAL INCLUDES INTERNAL FIXATION EACH;  Surgeon: Lorn Junes, MD;  Location: [facility];  Service: Orthopedics;  Laterality: Left;  . NISSEN FUNDOPLICATION  0174  . Right and left CARDIAC CATHETERIZATION  04/23/2013   Angiographic normal coronaries; LVEDP 20 mmHg, PCWP 12-14 mmHg, RAP 12 mmHg.; Fick CO/CI 4.9/2.2  . TUBAL LIGATION     with reversal in 1994  . VENTRAL HERNIA REPAIR      Family History  Problem Relation Age of Onset  . Diabetes Father   . Heart attack Father 49  . Coronary artery disease Father   . Heart failure Father   . COPD Mother   . Emphysema Mother   . Asthma Mother   . Heart failure Mother   . Heart attack Maternal Grandfather   . Sarcoidosis Maternal Uncle   . Lung cancer Brother   . Diabetes Brother   . Colon cancer Neg Hx     Social History   Social History  . Marital status: Married    Spouse name: NICKOLA LENIG  . Number of children: 2  . Years of education: 12   Occupational History  . DISABLED    Social History Main Topics  . Smoking status: Never Smoker  . Smokeless tobacco: Never Used  . Alcohol use No  . Drug use: No  . Sexual activity: Yes    Birth control/ protection: Surgical   Other Topics Concern  . None   Social History Narrative   Married 1994. 2 sons who both live close and 1 grandson.       Disability due to sarcoidosis. 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  albuterol (PROVENTIL HFA;VENTOLIN HFA) 108 (90 BASE) MCG/ACT inhaler, Inhale 1-2 puffs into the lungs every 6 (six) hours as needed for wheezing or shortness of breath., Disp: 8 g, Rfl: 5 .  allopurinol (ZYLOPRIM)  100 MG tablet, Take 100 mg by mouth at bedtime. , Disp: , Rfl:  .  aspirin EC 81 MG tablet, Take 81 mg by mouth daily. , Disp: , Rfl:  .  atorvastatin (LIPITOR) 40 MG tablet, TAKE 1 TAB BY MOUTH ONCE DAILY, Disp: 90 tablet, Rfl: 3 .  BAYER MICROLET LANCETS lancets, Use as instructed to check sugar 3 times daily, Disp: 300 each, Rfl: 5 .  benzonatate (TESSALON) 100 MG capsule, Take 100 mg by mouth 3 (three) times daily as needed for cough. Reported on 01/05/2016, Disp: , Rfl:  .  Blood Glucose Monitoring Suppl (BAYER CONTOUR MONITOR) w/Device KIT, Use daily to check sugar, Disp: 1 kit, Rfl: 0 .  buPROPion (WELLBUTRIN XL) 300 MG 24 hr tablet, TAKE 1 TABLET (300 MG TOTAL) BY MOUTH DAILY., Disp: 90 tablet, Rfl: 3 .  carvedilol (COREG) 25 MG tablet, Take 1 tablet (25 mg total) by mouth 2 (two) times daily with a meal., Disp: 180 tablet, Rfl: 3 .  DEXILANT 60 MG capsule, TAKE 1 CAPSULE BY MOUTH DAILY, Disp: 30 capsule, Rfl: 1 .  diclofenac sodium (VOLTAREN) 1 % GEL, APPLY 2 GRAMS TOPICALLY 4 TIMES A DAY, Disp: 100 g, Rfl: 0 .  diltiazem (CARDIZEM CD) 120 MG 24 hr capsule, Take 1 capsule (120 mg total) by mouth daily., Disp: 90 capsule, Rfl: 3 .  estradiol (ESTRACE) 2 MG tablet, Take 2 mg by mouth at bedtime. , Disp: , Rfl:  .  fenofibrate (TRICOR) 145 MG tablet, TAKE 1 TABLET (145 MG TOTAL) BY MOUTH DAILY., Disp: 30 tablet, Rfl: 6 .  fluticasone (FLONASE) 50 MCG/ACT nasal spray, Place 2 sprays into both nostrils daily., Disp: 48 g, Rfl: 3 .  furosemide (LASIX) 40 MG tablet, TAKE 1 TABLET BY MOUTH TWICE A DAY, Disp: 60 tablet, Rfl: 11 .  gabapentin (NEURONTIN) 100 MG capsule, TAKE 1 CAPSULE BY MOUTH 3 TIMES DAILY, Disp: 90 capsule, Rfl: 0 .  glucose blood (BAYER CONTOUR TEST) test strip, Use as instructed to check sugar 3 times daily, Disp: 300 each, Rfl: 5 .  glyBURIDE (DIABETA) 2.5 MG tablet, Take 0.5 tablets (1.25 mg total) by mouth at bedtime., Disp: 15 tablet, Rfl: 2 .  insulin aspart (NOVOLOG  FLEXPEN) 100 UNIT/ML FlexPen, Inject 19-24 Units into the skin 3 (three) times daily with meals., Disp: 30 mL, Rfl: 5 .  Insulin Pen Needle 33G X 4 MM MISC, 1 each by Does not apply route 4 (four) times daily., Disp: 200 each, Rfl: 11 .  LORazepam (ATIVAN) 1 MG tablet, , Disp: , Rfl:  .  losartan (COZAAR) 50 MG tablet, TAKE 1 TABLET (50 MG TOTAL) BY MOUTH DAILY., Disp: , Rfl: 3 .  metFORMIN (GLUCOPHAGE) 1000 MG tablet, TAKE 1 TABLET BY MOUTH TWICE A DAY WITH FOOD, Disp: 180 tablet, Rfl: 0 .  mometasone-formoterol (DULERA) 200-5 MCG/ACT AERO, Inhale 2 puffs into the lungs 2 (two) times daily., Disp: 13 g, Rfl: 5 .  nitroGLYCERIN (NITROSTAT) 0.4 MG SL tablet, Place 1 tablet (0.4 mg total) under the tongue every 5 (five) minutes as needed for chest pain. Reported on 01/05/2016, Disp: 100 tablet, Rfl: 0 .  ondansetron (ZOFRAN-ODT) 4 MG disintegrating tablet, Take 4 mg by mouth every 8 (eight) hours as needed for nausea or vomiting. Reported on 01/05/2016, Disp: , Rfl: 0 .  orlistat (XENICAL) 120 MG capsule, Take 1 capsule (120 mg total) by mouth 3 (three) times daily with meals., Disp: 90 capsule, Rfl: 2 .  PARoxetine Mesylate (BRISDELLE) 7.5 MG CAPS, Take 7.5 mg by mouth daily., Disp: , Rfl:  .  Potassium Chloride ER 20 MEQ TBCR, Take 1 tablet by mouth 3 (three) times daily., Disp: 90 tablet, Rfl: 3 .  prednisoLONE 5 MG TABS tablet, Take 5 mg by mouth daily., Disp: , Rfl:  .  TOUJEO SOLOSTAR 300 UNIT/ML SOPN, INJECT 40 UNITS INTO THE SKIN AT BEDTIME, Disp: 15 pen, Rfl: 1 .  traMADol (ULTRAM) 50 MG tablet, Take 1 tablet (50 mg total) by mouth every 12 (twelve) hours as needed for moderate pain or severe pain., Disp: 30 tablet, Rfl: 2 .  venlafaxine XR (EFFEXOR-XR) 75 MG 24 hr capsule, Take 2 capsules (150 mg total) by mouth at bedtime., Disp: , Rfl:  .  vitamin B-12 (CYANOCOBALAMIN) 1000 MCG tablet, Take 1,000 mcg by mouth daily., Disp: ,  Rfl:  .  fluconazole (DIFLUCAN) 150 MG tablet, Take 1 tablet (150 mg  total) by mouth daily., Disp: 3 tablet, Rfl: 0  EXAM:  Vitals:   06/17/17 1009  BP: 120/60  Pulse: 75  Temp: 98.1 F (36.7 C)    Body mass index is 41.5 kg/m.  GENERAL: vitals reviewed and listed above, alert, oriented, appears well hydrated and in no acute distress  HEENT: atraumatic, conjunttiva clear, no obvious abnormalities on inspection of external nose and ears, normal appearance of ear canals and TMs, clear nasal congestion, mild post oropharyngeal erythema with PND, no tonsillar edema or exudate, no sinus TTP, yeasty appearing white material on the tongue and dentures  NECK: no obvious masses on inspection  LUNGS: clear to auscultation bilaterally, no wheezes, rales or rhonchi, good air movement  CV: HRRR, no peripheral edema  ABD: bowel sounds positive, soft, nontender to palpation, no CVA tenderness  MS: moves all extremities without noticeable abnormality  PSYCH: pleasant and cooperative, no obvious depression or anxiety  ASSESSMENT AND PLAN:  Discussed the following assessment and plan:  Dysuria - Plan: POC Urinalysis Dipstick, Urine Microscopic Only, Culture, Urine, CANCELED: Culture, Urine -Will get a micro-and culture to confirm if there is truly infection or if this is from yeast -Advised urgent care visit if becomes more ill or symptoms worsening over the weekend before culture results -Advised plenty of water and avoidance of sweets, sugar and simple starches  Rhinosinusitis -This is more likely allergies given clear nasal congestion and ongoing symptoms after a course of strong antibiotic, advised addition of Claritin for 1 month  Yeast infection -Perhaps an antibiotic or the Flonase -Cent Diflucan for 3 days -This may be at the cause of the dysuria as well  -Patient advised to return or notify a doctor immediately if symptoms worsen or persist or new concerns arise.  Patient Instructions  Start the diflucan and take once daily for 3  days.  Start claritin and take once daily for 1 month  We have ordered labs or studies at this visit. It can take up to 1-2 weeks for results and processing. IF results require follow up or explanation, we will call you with instructions. Clinically stable results will be released to your [facility]. If you have not heard from [place] or cannot find your results in [facility] in 2 weeks please contact our office at [phone].  If you are not yet signed up for [facility], please consider signing up.  Drink plenty of water and avoid sweets, process foods and simple starches.  I hope you are feeling better soon! Seek care immediately if worsening, new concerns or you are not improving with treatment.        Colin Benton R., DO

## 2017-06-20 ENCOUNTER — Telehealth: Payer: Self-pay | Admitting: Family Medicine

## 2017-06-20 LAB — URINE CULTURE
MICRO NUMBER:: 80986160
SPECIMEN QUALITY:: ADEQUATE

## 2017-06-20 MED ORDER — NITROFURANTOIN MONOHYD MACRO 100 MG PO CAPS: 100.0000 mg | ORAL_CAPSULE | Freq: Two times a day (BID) | ORAL | 0 refills | Status: DC

## 2017-06-20 NOTE — Telephone Encounter (Signed)
[name] returning [name] °

## 2017-06-20 NOTE — Addendum Note (Signed)
Addended by: Agnes Lawrence on: 06/20/2017 12:01 PM   Modules accepted: Orders

## 2017-06-20 NOTE — Progress Notes (Addendum)
Anesthesia Chart Review:  [name] a 56 year old female scheduled for R total knee arthroplasty on 06/29/2017 with Meridee Score, MD  - PCP is Garret Reddish, DO - Endocrinologist is Renato Shin, MD - Pulmonologist is Loreta Ave, MD (notes in care everywhere)  - also sees pulmonology locally in [place]. Cleared for surgery at last office visit 04/15/17 with Rexene Edison, NP who recommends:  1. Short duration of surgery as much as possible and avoid paralytic if possible 3. DVT prophylaxis 4. Aggressive pulmonary toilet with o2, bronchodilatation, and incentive spirometry and early ambulation  PMH includes:  Chronic diastolic HF, HTN, DM, hyperlipidemia, COPD, pulmonary sarcoidosis, anemia.  Never smoker. BMI 41. S/p partial excision L calcaneous 06/27/15. S/p ORIF L foot fx 04/09/14.   Medications include: albuterol, ASA 81mg , lipitor, carvedilol, diltiazem, fenofibrate, lasix, glyburide, novolog, losartan, metformin, dulera, orlistat, potassium, prednisone, toujeo  BP (!) 141/55   Pulse 78   Temp 36.8 C   Resp 20   Ht 5\' 6"  (1.676 m)   Wt 256 lb 1.6 oz (116.2 kg)   SpO2 98%   BMI 41.34 kg/m   Preoperative labs reviewed.   - Has UTI. Recently treated 06/01/17 with Augmentin. Started on Macrobid 06/20/17.  - HbA1c 7.5, glucose 215.  - WBC 15.5.   I called and spoke with pt by telephone 06/24/17 regarding UTI and elevated WBC.  She reports feeling UTI has resolved after treatment with macrobid started 06/20/17.  Regarding elevated WBC count, pt denies fever, chills, cough, sore throat, ear ache, dental pain, rash.  Denies feeling ill.  WBC elevation may be related to UTI.  Will recheck CBC DOS.     CXR 03/09/17: No active cardiopulmonary disease.  EKG 03/10/17: SR with occasional PVCs  Cardiac cath 03/11/17:  1. Angiographically normal left main, RCA, and LCx 2. Minimal nonobstructive irregularity in the LAD 3. Normal LVEDP  Nuclear stress test 03/10/17:  1. There is a  moderate-sized stress-induced perfusion defect at the apex as well as ill-defined stress-induced perfusion defect in the lateral. 2. Normal left ventricular wall motion. 3. Left ventricular ejection fraction 55% 4. Non invasive risk stratification: Intermediate  Echo 09/02/14:  - Left ventricle: The cavity size was normal. Wall thickness wasincreased in a pattern of mild LVH. Systolic function was normal.The estimated ejection fraction was in the range of 55% to 60%.Wall motion was normal; there were no regional wall motionabnormalities. Doppler parameters are consistent with abnormalleft ventricular relaxation (grade 1 diastolic dysfunction). - Aortic valve: There was no stenosis. - Mitral valve: Mildly calcified annulus. There was no significantregurgitation. - Left atrium: The atrium was mildly dilated. - Right ventricle: The cavity size was normal. Systolic functionwas normal. - Inferior vena cava: The vessel was normal in size. The respirophasic diameter changes were in the normal range (>= 50%),consistent with normal central venous pressure.  If labs acceptable DOS, I anticipate pt can proceed with surgery as scheduled.   Willeen Cass, FNP-BC [facility]/Anesthesiology Phone: [phone] 06/24/2017 12:45 PM

## 2017-06-20 NOTE — Telephone Encounter (Signed)
See results note. 

## 2017-06-22 ENCOUNTER — Other Ambulatory Visit (INDEPENDENT_AMBULATORY_CARE_PROVIDER_SITE_OTHER): Payer: Self-pay | Admitting: Family

## 2017-06-22 ENCOUNTER — Telehealth: Payer: Self-pay | Admitting: Pulmonary Disease

## 2017-06-22 NOTE — Telephone Encounter (Signed)
PM we received a PA for the pts dexilant and this is not covered by her insurance company.  Do you want to proceed with the PA or change to a different medication.  Please advise. Thanks

## 2017-06-24 ENCOUNTER — Other Ambulatory Visit: Payer: Self-pay | Admitting: Obstetrics and Gynecology

## 2017-06-24 DIAGNOSIS — N6310 Unspecified lump in the right breast, unspecified quadrant: Secondary | ICD-10-CM

## 2017-06-27 ENCOUNTER — Other Ambulatory Visit: Payer: Self-pay | Admitting: Internal Medicine

## 2017-06-27 ENCOUNTER — Ambulatory Visit
Admission: RE | Admit: 2017-06-27 | Discharge: 2017-06-27 | Disposition: A | Discharge status: Home / Self Care | Payer: BLUE CROSS/BLUE SHIELD | Source: Ambulatory Visit | Attending: Obstetrics and Gynecology | Admitting: Obstetrics and Gynecology

## 2017-06-27 ENCOUNTER — Other Ambulatory Visit: Payer: Self-pay | Admitting: Obstetrics and Gynecology

## 2017-06-27 ENCOUNTER — Telehealth: Payer: Self-pay | Admitting: Family Medicine

## 2017-06-27 DIAGNOSIS — N6310 Unspecified lump in the right breast, unspecified quadrant: Secondary | ICD-10-CM

## 2017-06-27 DIAGNOSIS — R928 Other abnormal and inconclusive findings on diagnostic imaging of breast: Secondary | ICD-10-CM | POA: Diagnosis not present

## 2017-06-27 NOTE — Telephone Encounter (Signed)
This is an Hospital doctor patient.  Thanks!

## 2017-06-27 NOTE — Telephone Encounter (Signed)
I dont think we are managing the PPI. We may have renewed it one time when she ran out Can you ask her who started it? She can follow with her primary care about this.  Marshell Garfinkel MD [facility] 06/27/2017, 9:35 AM

## 2017-06-27 NOTE — Telephone Encounter (Signed)
Received PA request for Dexilant. PA approved & form faxed back to pharmacy.

## 2017-06-27 NOTE — Telephone Encounter (Signed)
Spoke with pt.  She states that Dr Joya Gaskins had originally prescribed Dexilant.  I advised pt that Dr Vaughan Browner would like her PCP to follow this. Pt verbalized understanding and will contact her pharmacy and have them send request to Dr [facility] office.

## 2017-06-28 ENCOUNTER — Encounter: Payer: Self-pay | Admitting: Endocrinology

## 2017-06-28 ENCOUNTER — Ambulatory Visit (INDEPENDENT_AMBULATORY_CARE_PROVIDER_SITE_OTHER): Payer: BLUE CROSS/BLUE SHIELD | Admitting: Endocrinology

## 2017-06-28 VITALS — BP 140/72 | HR 76 | Wt 256.2 lb

## 2017-06-28 DIAGNOSIS — IMO0002 Reserved for concepts with insufficient information to code with codable children: Secondary | ICD-10-CM

## 2017-06-28 DIAGNOSIS — Z794 Long term (current) use of insulin: Secondary | ICD-10-CM | POA: Diagnosis not present

## 2017-06-28 DIAGNOSIS — E1165 Type 2 diabetes mellitus with hyperglycemia: Secondary | ICD-10-CM | POA: Diagnosis not present

## 2017-06-28 DIAGNOSIS — E1140 Type 2 diabetes mellitus with diabetic neuropathy, unspecified: Secondary | ICD-10-CM

## 2017-06-28 MED ORDER — INSULIN ASPART 100 UNIT/ML FLEXPEN
22.0000 [IU] | PEN_INJECTOR | Freq: Three times a day (TID) | SUBCUTANEOUS | 5 refills | Status: DC
Start: 2017-06-28 — End: 2017-09-06

## 2017-06-28 MED ORDER — INSULIN GLARGINE 300 UNIT/ML ~~LOC~~ SOPN: 45.0000 [IU] | PEN_INJECTOR | Freq: Every day | SUBCUTANEOUS | 11 refills | Status: DC

## 2017-06-28 NOTE — Patient Instructions (Signed)
check your blood sugar 3 times a day.  vary the time of day when you check, between before the 3 meals, and at bedtime.  also check if you have symptoms of your blood sugar being too high or too low.  please keep a record of the readings and bring it to your next appointment here (or you can bring the meter itself).  You can write it on any piece of paper.  please call us sooner if your blood sugar goes below 70, or if you have a lot of readings over 200. Please increase the novolog to 22-27 units 3 times a day (just before each meal), and: Stop taking the the glyburide Please continue the same metformin and toujeo Please come back for a follow-up appointment in 2 months.

## 2017-06-28 NOTE — Progress Notes (Signed)
Subjective:    Patient ID: [name], female    DOB: 1960/11/29, 56 y.o.   MRN: [id number]  HPI Pt returns for f/u of diabetes mellitus: DM type: Insulin-requiring type 2 Dx'ed: 5465 Complications: polyneuropathy, renal insuff, and mild CAD Therapy: insulin since 2006, and 2 oral meds.  GDM: never DKA: never Severe hypoglycemia: never Pancreatitis: never Pancreatic imaging: CT (2015) showed fatty atrophy.  Other: she takes multiple daily injections; she intermittently takes prednisone for sarcoidosis.  Interval history: she will have right TKR soon.  She says cbg's vary from 100-200.  It is in general highest at lunch.  Past Medical History:  Diagnosis Date  . Abnormal SPEP 04/17/2014  . Acute left ankle pain 01/26/2017  . ANEMIA-UNSPECIFIED 09/18/2009  . Chronic diastolic heart failure, NYHA class 2 (HCC)    LVEDP roughly 20% by cath  . COPD (chronic obstructive pulmonary disease) ([place])   . Depression   . DIABETES MELLITUS, TYPE II 08/21/2006  . Diabetic osteomyelitis ([place]) 05/29/2015  . Fracture of 5th metatarsal    non union  . GERD 08/21/2006  . GOUT 08/20/2010  . Hx of umbilical hernia repair   . HYPERLIPIDEMIA 08/21/2006  . HYPERTENSION 08/21/2006  . Infection of wound due to methicillin resistant Staphylococcus aureus (MRSA)   . Internal hemorrhoids   . Multiple allergies 10/14/2016  . OBESITY 06/04/2009  . Onychomycosis 10/27/2015  . Osteomyelitis of left foot ([place]) 05/29/2015  . Pulmonary sarcoidosis ([place])    Followed locally by pulmonology, but also by Dr. Casper Harrison at [facility]  . Right knee pain 01/26/2017  . Vocal cord dysfunction   . Wears partial dentures     Past Surgical History:  Procedure Laterality Date  . ABDOMINAL HYSTERECTOMY    . APPENDECTOMY    . BLADDER SUSPENSION  11/11/2011   Procedure: TRANSVAGINAL TAPE (TVT) PROCEDURE;  Surgeon: Olga Millers, MD;  Location: [facility];  Service: Gynecology;  Laterality: N/A;  . CARDIAC  CATHETERIZATION  07/2010   LVEF 50-55% WITH VERY MILD GLOBAL HYPOKINESIA; ESSENTIALLY NORMAL CORONARY ARTERIES; NORMAL LV FUNCTION  . CAROTIDS  02/18/11   CAROTID DUPLEX; VERTEBRALS ARE PATENT WITH ANTEGRADE FLOW. ICA/CCA RATIO 1.61 ON RIGHT AND 0.75 ON LEFT  . CHOLECYSTECTOMY  1984  . CYSTOSCOPY  11/11/2011   Procedure: CYSTOSCOPY;  Surgeon: Olga Millers, MD;  Location: [facility];  Service: Gynecology;  Laterality: N/A;  . DOPPLER ECHOCARDIOGRAPHY  02/12/2013   LV FUNCTION, SIZE NORMAL; MILD CONCENTRIC LVH; EST EF 55-65%; WALL MOTION NORMAL  . FRACTURE SURGERY     foot  . HERNIA REPAIR    . I&D EXTREMITY Left 06/27/2015   Procedure: Partial Excision Left Calcaneus, Place Antibiotic Beads, and Wound VAC;  Surgeon: Newt Minion, MD;  Location: [facility];  Service: Orthopedics;  Laterality: Left;  . KNEE ARTHROSCOPY     right  . LEFT AND RIGHT HEART CATHETERIZATION WITH CORONARY ANGIOGRAM N/A 04/23/2013   Procedure: LEFT AND RIGHT HEART CATHETERIZATION WITH CORONARY ANGIOGRAM;  Surgeon: Leonie Man, MD;  Location: [facility] CATH LAB;  Service: Cardiovascular;  Laterality: N/A;  . LEFT HEART CATH AND CORONARY ANGIOGRAPHY N/A 03/11/2017   Procedure: Left Heart Cath and Coronary Angiography;  Surgeon: Sherren Mocha, MD;  Location: [facility] CV LAB;  Service: Cardiovascular;  Laterality: N/A;  . LexiScan Myoview  03/09/2013   EF 50%; NORMAL MYOCARDIAL PERFUSION STUDY - breast attenuation  . METATARSAL OSTEOTOMY WITH OPEN REDUCTION INTERNAL FIXATION (ORIF) METATARSAL WITH FUSION  Left 04/09/2014   Procedure: LEFT FOOT FRACTURE OPEN TREATMENT METATARSAL INCLUDES INTERNAL FIXATION EACH;  Surgeon: Lorn Junes, MD;  Location: [facility];  Service: Orthopedics;  Laterality: Left;  . NISSEN FUNDOPLICATION  9357  . Right and left CARDIAC CATHETERIZATION  04/23/2013   Angiographic normal coronaries; LVEDP 20 mmHg, PCWP 12-14 mmHg, RAP 12 mmHg.; Fick CO/CI 4.9/2.2  . TUBAL LIGATION     with  reversal in 1994  . VENTRAL HERNIA REPAIR      Social History   Social History  . Marital status: Married    Spouse name: TAKIRAH BINFORD  . Number of children: 2  . Years of education: 12   Occupational History  . DISABLED    Social History Main Topics  . Smoking status: Never Smoker  . Smokeless tobacco: Never Used  . Alcohol use No  . Drug use: No  . Sexual activity: Yes    Birth control/ protection: Surgical   Other Topics Concern  . Not on file   Social History Narrative   Married 1994. 2 sons who both live close and 1 grandson.       Disability due to sarcoidosis. 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 albuterol (PROVENTIL HFA;VENTOLIN HFA) 108 (90 BASE) MCG/ACT inhaler Inhale 1-2 puffs into the lungs every 6 (six) hours as needed for wheezing or shortness of breath. 8 g 5  . albuterol (PROVENTIL) (2.5 MG/3ML) 0.083% nebulizer solution Take 2.5 mg by nebulization every 6 (six) hours as needed for wheezing or shortness of breath.    . allopurinol (ZYLOPRIM) 100 MG tablet Take 100 mg by mouth at bedtime.     [name] aspirin EC 81 MG tablet Take 81 mg by mouth daily.     [name] atorvastatin (LIPITOR) 40 MG tablet TAKE 1 TAB BY MOUTH ONCE DAILY (Patient taking differently: TAKE 1 TABLET (40 MG) BY MOUTH ONCE DAILY) 90 tablet 3  . BAYER MICROLET LANCETS lancets Use as instructed to check sugar 3 times daily 300 each 5  . benzonatate (TESSALON) 100 MG capsule Take 100 mg by mouth 3 (three) times daily as needed for cough. Reported on 01/05/2016    . Blood Glucose Monitoring Suppl (BAYER CONTOUR MONITOR) w/Device KIT Use daily to check sugar 1 kit 0  . buPROPion (WELLBUTRIN XL) 300 MG 24 hr tablet TAKE 1 TABLET (300 MG TOTAL) BY MOUTH DAILY. 90 tablet  3  . carvedilol (COREG) 25 MG tablet Take 1 tablet (25 mg total) by mouth 2 (two) times daily with a meal. 180 tablet 3  . DEXILANT 60 MG capsule TAKE 1 CAPSULE BY MOUTH DAILY (Patient taking differently: TAKE 1 CAPSULE (60 MG) BY MOUTH DAILY) 30 capsule 1  . diclofenac sodium (VOLTAREN) 1 % GEL APPLY 2 GRAMS TOPICALLY 4 TIMES A DAY (Patient taking differently: APPLY 2 GRAMS TOPICALLY 4 TIMES A DAY AS NEEDED FOR KNEE PAIN) 100 g 0  . diltiazem (CARDIZEM CD) 120 MG 24 hr capsule Take 1 capsule (120 mg total) by mouth daily. 90 capsule 3  . estradiol (ESTRACE) 2 MG tablet Take 2 mg by mouth at bedtime.     . fenofibrate (TRICOR) 145 MG tablet TAKE 1 TABLET (145 MG TOTAL) BY MOUTH  DAILY. 30 tablet 6  . fluconazole (DIFLUCAN) 150 MG tablet Take 1 tablet (150 mg total) by mouth daily. 3 tablet 0  . fluticasone (FLONASE) 50 MCG/ACT nasal spray Place 2 sprays into both nostrils daily. 48 g 3  . furosemide (LASIX) 40 MG tablet TAKE 1 TABLET BY MOUTH TWICE A DAY (Patient taking differently: TAKE 1 TABLET (40 MG) BY MOUTH TWICE A DAY) 60 tablet 11  . gabapentin (NEURONTIN) 100 MG capsule TAKE 1 CAPSULE BY MOUTH THREE TIMES A DAY 90 capsule 0  . glucose blood (BAYER CONTOUR TEST) test strip Use as instructed to check sugar 3 times daily 300 each 5  . Insulin Pen Needle 33G X 4 MM MISC 1 each by Does not apply route 4 (four) times daily. 200 each 11  . LORazepam (ATIVAN) 0.5 MG tablet Take 0.5 mg by mouth every 12 (twelve) hours as needed for anxiety.    [name] losartan (COZAAR) 50 MG tablet TAKE 1 TABLET (50 MG TOTAL) BY MOUTH DAILY.  3  . metFORMIN (GLUCOPHAGE) 1000 MG tablet TAKE 1 TABLET BY MOUTH TWICE A DAY WITH FOOD (Patient taking differently: TAKE 1 TABLET (1000 MG) BY MOUTH TWICE A DAY WITH FOOD) 180 tablet 0  . mometasone-formoterol (DULERA) 200-5 MCG/ACT AERO Inhale 2 puffs into the lungs 2 (two) times daily. 13 g 5  . nitroGLYCERIN (NITROSTAT) 0.4 MG SL tablet Place 1 tablet (0.4 mg total) under the  tongue every 5 (five) minutes as needed for chest pain. Reported on 01/05/2016 100 tablet 0  . ondansetron (ZOFRAN-ODT) 4 MG disintegrating tablet Take 4 mg by mouth every 8 (eight) hours as needed for nausea or vomiting. Reported on 01/05/2016  0  . orlistat (XENICAL) 120 MG capsule Take 1 capsule (120 mg total) by mouth 3 (three) times daily with meals. 90 capsule 2  . PARoxetine Mesylate (BRISDELLE) 7.5 MG CAPS Take 7.5 mg by mouth daily.    . Potassium Chloride ER 20 MEQ TBCR Take 1 tablet by mouth 3 (three) times daily. 90 tablet 3  . predniSONE (DELTASONE) 5 MG tablet Take 5 mg by mouth daily with breakfast.    . traMADol (ULTRAM) 50 MG tablet Take 1 tablet (50 mg total) by mouth every 12 (twelve) hours as needed for moderate pain or severe pain. 30 tablet 2  . venlafaxine XR (EFFEXOR-XR) 75 MG 24 hr capsule Take 2 capsules (150 mg total) by mouth at bedtime.    . vitamin B-12 (CYANOCOBALAMIN) 1000 MCG tablet Take 1,000 mcg by mouth daily.     No current facility-administered medications on file prior to visit.     Allergies  Allergen Reactions  . Methotrexate Other (See Comments)    Peri-oral and buccal lesions  . Vancomycin Other (See Comments)    Nephrotoxicity   . Clindamycin/Lincomycin Nausea And Vomiting and Rash  . Chlorhexidine Itching  . Doxycycline Rash  . Lisinopril Cough  . Teflaro [Ceftaroline] Rash    Family History  Problem Relation Age of Onset  . Diabetes Father   . Heart attack Father 64  . Coronary artery disease Father   . Heart failure Father   . COPD Mother   . Emphysema Mother   . Asthma Mother   . Heart failure Mother   . Breast cancer Mother   . Heart attack Maternal Grandfather   . Sarcoidosis Maternal Uncle   . Lung cancer Brother   . Diabetes Brother   . Colon cancer Neg Hx  BP 140/72   Pulse 76   Wt 256 lb 3.2 oz (116.2 kg)   SpO2 96%   BMI 41.35 kg/m    Review of Systems She denies hypoglycemia.     Objective:   Physical  Exam VITAL SIGNS:  See vs page GENERAL: no distress Pulses: foot pulses are intact bilaterally.   MSK: no deformity of the feet or ankles.  CV: no edema of the legs or ankles Skin:  no ulcer on the feet or ankles.  normal color and temp on the feet and ankles.   Neuro: sensation is intact to touch on the feet and ankles, but decreased from normal.  Ext: There is bilateral onychomycosis of the toenails     Lab Results  Component Value Date   CREATININE 1.34 (H) 06/17/2017   BUN 25 (H) 06/17/2017   NA 138 06/17/2017   K 3.8 06/17/2017   CL 98 (L) 06/17/2017   CO2 29 06/17/2017   Lab Results  Component Value Date   HGBA1C 7.5 (H) 06/17/2017      Assessment & Plan:  Insulin-requiring type 2 DM, with CAD: she needs increased rx, if it can be done with a regimen that avoids or minimizes hypoglycemia. Renal failure: she should d/c glyburide.  Sarcoidosis: we'll have to adjust insulin if she goes back on prednisone.  OA: perioperatively, all she needs to do is to hold insulin for any missed meal  Patient Instructions  check your blood sugar 3 times a day.  vary the time of day when you check, between before the 3 meals, and at bedtime.  also check if you have symptoms of your blood sugar being too high or too low.  please keep a record of the readings and bring it to your next appointment here (or you can bring the meter itself).  You can write it on any piece of paper.  please call us sooner if your blood sugar goes below 70, or if you have a lot of readings over 200. Please increase the novolog to 22-27 units 3 times a day (just before each meal), and: Stop taking the the glyburide Please continue the same metformin and toujeo Please come back for a follow-up appointment in 2 months.

## 2017-06-29 ENCOUNTER — Inpatient Hospital Stay (HOSPITAL_COMMUNITY): Payer: BLUE CROSS/BLUE SHIELD | Admitting: Emergency Medicine

## 2017-06-29 ENCOUNTER — Inpatient Hospital Stay (HOSPITAL_COMMUNITY)
Admission: RE | Admit: 2017-06-29 | Discharge: 2017-07-01 | DRG: 470 | Disposition: A | Discharge status: Home Health | Payer: BLUE CROSS/BLUE SHIELD | Source: Ambulatory Visit | Attending: Orthopedic Surgery | Admitting: Orthopedic Surgery

## 2017-06-29 ENCOUNTER — Encounter (HOSPITAL_COMMUNITY)
Admission: RE | Disposition: A | Discharge status: Home Health | Payer: Self-pay | Source: Ambulatory Visit | Attending: Orthopedic Surgery

## 2017-06-29 ENCOUNTER — Other Ambulatory Visit: Payer: Self-pay | Admitting: Internal Medicine

## 2017-06-29 ENCOUNTER — Other Ambulatory Visit: Payer: Self-pay

## 2017-06-29 ENCOUNTER — Encounter (HOSPITAL_COMMUNITY): Payer: Self-pay | Admitting: Certified Registered Nurse Anesthetist

## 2017-06-29 DIAGNOSIS — Z79899 Other long term (current) drug therapy: Secondary | ICD-10-CM

## 2017-06-29 DIAGNOSIS — Z9049 Acquired absence of other specified parts of digestive tract: Secondary | ICD-10-CM | POA: Diagnosis not present

## 2017-06-29 DIAGNOSIS — K760 Fatty (change of) liver, not elsewhere classified: Secondary | ICD-10-CM | POA: Diagnosis present

## 2017-06-29 DIAGNOSIS — M8788 Other osteonecrosis, other site: Secondary | ICD-10-CM | POA: Diagnosis not present

## 2017-06-29 DIAGNOSIS — Z7982 Long term (current) use of aspirin: Secondary | ICD-10-CM

## 2017-06-29 DIAGNOSIS — Z833 Family history of diabetes mellitus: Secondary | ICD-10-CM | POA: Diagnosis not present

## 2017-06-29 DIAGNOSIS — I130 Hypertensive heart and chronic kidney disease with heart failure and stage 1 through stage 4 chronic kidney disease, or unspecified chronic kidney disease: Secondary | ICD-10-CM | POA: Diagnosis not present

## 2017-06-29 DIAGNOSIS — Z23 Encounter for immunization: Secondary | ICD-10-CM | POA: Diagnosis not present

## 2017-06-29 DIAGNOSIS — Z6841 Body Mass Index (BMI) 40.0 and over, adult: Secondary | ICD-10-CM

## 2017-06-29 DIAGNOSIS — D649 Anemia, unspecified: Secondary | ICD-10-CM | POA: Diagnosis present

## 2017-06-29 DIAGNOSIS — Z7952 Long term (current) use of systemic steroids: Secondary | ICD-10-CM | POA: Diagnosis not present

## 2017-06-29 DIAGNOSIS — E785 Hyperlipidemia, unspecified: Secondary | ICD-10-CM | POA: Diagnosis not present

## 2017-06-29 DIAGNOSIS — Z803 Family history of malignant neoplasm of breast: Secondary | ICD-10-CM | POA: Diagnosis not present

## 2017-06-29 DIAGNOSIS — K219 Gastro-esophageal reflux disease without esophagitis: Secondary | ICD-10-CM | POA: Diagnosis present

## 2017-06-29 DIAGNOSIS — M109 Gout, unspecified: Secondary | ICD-10-CM | POA: Diagnosis present

## 2017-06-29 DIAGNOSIS — Z794 Long term (current) use of insulin: Secondary | ICD-10-CM

## 2017-06-29 DIAGNOSIS — Z825 Family history of asthma and other chronic lower respiratory diseases: Secondary | ICD-10-CM | POA: Diagnosis not present

## 2017-06-29 DIAGNOSIS — Z79891 Long term (current) use of opiate analgesic: Secondary | ICD-10-CM

## 2017-06-29 DIAGNOSIS — Z8249 Family history of ischemic heart disease and other diseases of the circulatory system: Secondary | ICD-10-CM

## 2017-06-29 DIAGNOSIS — Z8614 Personal history of Methicillin resistant Staphylococcus aureus infection: Secondary | ICD-10-CM

## 2017-06-29 DIAGNOSIS — Z888 Allergy status to other drugs, medicaments and biological substances status: Secondary | ICD-10-CM | POA: Diagnosis not present

## 2017-06-29 DIAGNOSIS — Z881 Allergy status to other antibiotic agents status: Secondary | ICD-10-CM

## 2017-06-29 DIAGNOSIS — D860 Sarcoidosis of lung: Secondary | ICD-10-CM | POA: Diagnosis present

## 2017-06-29 DIAGNOSIS — G4730 Sleep apnea, unspecified: Secondary | ICD-10-CM | POA: Diagnosis present

## 2017-06-29 DIAGNOSIS — E1122 Type 2 diabetes mellitus with diabetic chronic kidney disease: Secondary | ICD-10-CM | POA: Diagnosis not present

## 2017-06-29 DIAGNOSIS — M1711 Unilateral primary osteoarthritis, right knee: Secondary | ICD-10-CM | POA: Diagnosis not present

## 2017-06-29 DIAGNOSIS — M25761 Osteophyte, right knee: Secondary | ICD-10-CM | POA: Diagnosis present

## 2017-06-29 DIAGNOSIS — N183 Chronic kidney disease, stage 3 (moderate): Secondary | ICD-10-CM | POA: Diagnosis present

## 2017-06-29 DIAGNOSIS — I10 Essential (primary) hypertension: Secondary | ICD-10-CM | POA: Diagnosis not present

## 2017-06-29 DIAGNOSIS — G8918 Other acute postprocedural pain: Secondary | ICD-10-CM | POA: Diagnosis not present

## 2017-06-29 DIAGNOSIS — Z96651 Presence of right artificial knee joint: Secondary | ICD-10-CM

## 2017-06-29 DIAGNOSIS — Z801 Family history of malignant neoplasm of trachea, bronchus and lung: Secondary | ICD-10-CM

## 2017-06-29 DIAGNOSIS — F329 Major depressive disorder, single episode, unspecified: Secondary | ICD-10-CM | POA: Diagnosis present

## 2017-06-29 HISTORY — PX: TOTAL KNEE ARTHROPLASTY: SHX125

## 2017-06-29 LAB — CBC
HCT: 31.6 % — ABNORMAL LOW (ref 36.0–46.0)
Hemoglobin: 10.1 g/dL — ABNORMAL LOW (ref 12.0–15.0)
MCH: 27.8 pg (ref 26.0–34.0)
MCHC: 32 g/dL (ref 30.0–36.0)
MCV: 87.1 fL (ref 78.0–100.0)
Platelets: 362 10*3/uL (ref 150–400)
RBC: 3.63 MIL/uL — ABNORMAL LOW (ref 3.87–5.11)
RDW: 14.2 % (ref 11.5–15.5)
WBC: 13.6 10*3/uL — ABNORMAL HIGH (ref 4.0–10.5)

## 2017-06-29 LAB — GLUCOSE, CAPILLARY
Glucose-Capillary: 116 mg/dL — ABNORMAL HIGH (ref 65–99)
Glucose-Capillary: 106 mg/dL — ABNORMAL HIGH (ref 65–99)
Glucose-Capillary: 118 mg/dL — ABNORMAL HIGH (ref 65–99)
Glucose-Capillary: 152 mg/dL — ABNORMAL HIGH (ref 65–99)
Glucose-Capillary: 148 mg/dL — ABNORMAL HIGH (ref 65–99)

## 2017-06-29 SURGERY — ARTHROPLASTY, KNEE, TOTAL
Anesthesia: Monitor Anesthesia Care | Site: Knee | Laterality: Right

## 2017-06-29 MED ORDER — FLUTICASONE PROPIONATE 50 MCG/ACT NA SUSP
2.0000 | Freq: Every day | NASAL | Status: DC
  Administered 2017-06-30 – 2017-07-01 (×2): 2 via NASAL
  Filled 2017-06-29: qty 16

## 2017-06-29 MED ORDER — CARVEDILOL 25 MG PO TABS
25.0000 mg | ORAL_TABLET | Freq: Two times a day (BID) | ORAL | Status: DC
  Administered 2017-06-29 – 2017-07-01 (×4): 25 mg via ORAL
  Filled 2017-06-29 (×4): qty 1

## 2017-06-29 MED ORDER — ALBUTEROL SULFATE (2.5 MG/3ML) 0.083% IN NEBU: 2.5000 mg | INHALATION_SOLUTION | Freq: Four times a day (QID) | RESPIRATORY_TRACT | Status: DC | PRN

## 2017-06-29 MED ORDER — TRANEXAMIC ACID 1000 MG/10ML IV SOLN
2000.0000 mg | INTRAVENOUS | Status: AC
  Administered 2017-06-29: 2000 mg via TOPICAL
  Filled 2017-06-29: qty 20

## 2017-06-29 MED ORDER — PREDNISONE 5 MG PO TABS
5.0000 mg | ORAL_TABLET | Freq: Every day | ORAL | Status: DC
  Administered 2017-06-30 – 2017-07-01 (×2): 5 mg via ORAL
  Filled 2017-06-29 (×2): qty 1

## 2017-06-29 MED ORDER — MIDAZOLAM HCL 2 MG/2ML IJ SOLN
INTRAMUSCULAR | Status: AC
  Filled 2017-06-29: qty 2

## 2017-06-29 MED ORDER — OXYCODONE HCL 5 MG PO TABS
ORAL_TABLET | ORAL | Status: AC
  Administered 2017-06-29: 10 mg via ORAL
  Filled 2017-06-29: qty 2

## 2017-06-29 MED ORDER — ASPIRIN EC 325 MG PO TBEC
325.0000 mg | DELAYED_RELEASE_TABLET | Freq: Every day | ORAL | Status: DC
  Administered 2017-06-30 – 2017-07-01 (×2): 325 mg via ORAL
  Filled 2017-06-29 (×2): qty 1

## 2017-06-29 MED ORDER — MENTHOL 3 MG MT LOZG: 1.0000 | LOZENGE | OROMUCOSAL | Status: DC | PRN

## 2017-06-29 MED ORDER — METFORMIN HCL 500 MG PO TABS
1000.0000 mg | ORAL_TABLET | Freq: Two times a day (BID) | ORAL | Status: DC
  Administered 2017-06-29 – 2017-07-01 (×4): 1000 mg via ORAL
  Filled 2017-06-29 (×4): qty 2

## 2017-06-29 MED ORDER — METOCLOPRAMIDE HCL 5 MG/ML IJ SOLN: 5.0000 mg | Freq: Three times a day (TID) | INTRAMUSCULAR | Status: DC | PRN

## 2017-06-29 MED ORDER — PROMETHAZINE HCL 25 MG/ML IJ SOLN: 6.2500 mg | INTRAMUSCULAR | Status: DC | PRN

## 2017-06-29 MED ORDER — LOSARTAN POTASSIUM 50 MG PO TABS
50.0000 mg | ORAL_TABLET | Freq: Every day | ORAL | Status: DC
Start: 2017-06-29 — End: 2017-07-01
  Administered 2017-06-29 – 2017-07-01 (×3): 50 mg via ORAL
  Filled 2017-06-29 (×3): qty 1

## 2017-06-29 MED ORDER — MIDAZOLAM HCL 5 MG/5ML IJ SOLN
INTRAMUSCULAR | Status: DC | PRN
  Administered 2017-06-29: 2 mg via INTRAVENOUS

## 2017-06-29 MED ORDER — PHENYLEPHRINE 40 MCG/ML (10ML) SYRINGE FOR IV PUSH (FOR BLOOD PRESSURE SUPPORT)
PREFILLED_SYRINGE | INTRAVENOUS | Status: AC
  Filled 2017-06-29: qty 10

## 2017-06-29 MED ORDER — PHENOL 1.4 % MT LIQD: 1.0000 | OROMUCOSAL | Status: DC | PRN

## 2017-06-29 MED ORDER — ATORVASTATIN CALCIUM 40 MG PO TABS
40.0000 mg | ORAL_TABLET | Freq: Every day | ORAL | Status: DC
  Administered 2017-06-30 – 2017-07-01 (×2): 40 mg via ORAL
  Filled 2017-06-29 (×2): qty 1

## 2017-06-29 MED ORDER — ONDANSETRON HCL 4 MG/2ML IJ SOLN: 4.0000 mg | Freq: Four times a day (QID) | INTRAMUSCULAR | Status: DC | PRN

## 2017-06-29 MED ORDER — TRAMADOL HCL 50 MG PO TABS: 50.0000 mg | ORAL_TABLET | Freq: Two times a day (BID) | ORAL | Status: DC | PRN

## 2017-06-29 MED ORDER — HYDROMORPHONE HCL 1 MG/ML IJ SOLN
INTRAMUSCULAR | Status: AC
  Administered 2017-06-29: 0.5 mg via INTRAVENOUS
  Filled 2017-06-29: qty 1

## 2017-06-29 MED ORDER — CEFAZOLIN SODIUM-DEXTROSE 2-4 GM/100ML-% IV SOLN
INTRAVENOUS | Status: AC
  Administered 2017-06-29: 2 g via INTRAVENOUS
  Filled 2017-06-29: qty 100

## 2017-06-29 MED ORDER — 0.9 % SODIUM CHLORIDE (POUR BTL) OPTIME
TOPICAL | Status: DC | PRN
  Administered 2017-06-29: 1000 mL

## 2017-06-29 MED ORDER — DIPHENHYDRAMINE HCL 50 MG/ML IJ SOLN
INTRAMUSCULAR | Status: DC | PRN
  Administered 2017-06-29: 12.5 mg via INTRAVENOUS

## 2017-06-29 MED ORDER — FENOFIBRATE 54 MG PO TABS
54.0000 mg | ORAL_TABLET | Freq: Every day | ORAL | Status: DC
  Administered 2017-06-30 – 2017-07-01 (×2): 54 mg via ORAL
  Filled 2017-06-29 (×2): qty 1

## 2017-06-29 MED ORDER — METFORMIN HCL 1000 MG PO TABS: 1000.0000 mg | ORAL_TABLET | Freq: Two times a day (BID) | ORAL | 0 refills | Status: DC

## 2017-06-29 MED ORDER — LIDOCAINE 2% (20 MG/ML) 5 ML SYRINGE
INTRAMUSCULAR | Status: AC
  Filled 2017-06-29: qty 5

## 2017-06-29 MED ORDER — ACETAMINOPHEN 325 MG PO TABS
650.0000 mg | ORAL_TABLET | Freq: Four times a day (QID) | ORAL | Status: DC | PRN
  Administered 2017-06-30: 650 mg via ORAL
  Filled 2017-06-29: qty 2

## 2017-06-29 MED ORDER — DEXAMETHASONE SODIUM PHOSPHATE 10 MG/ML IJ SOLN
INTRAMUSCULAR | Status: AC
  Filled 2017-06-29: qty 1

## 2017-06-29 MED ORDER — PAROXETINE MESYLATE 7.5 MG PO CAPS: 7.5000 mg | ORAL_CAPSULE | Freq: Every day | ORAL | Status: DC

## 2017-06-29 MED ORDER — ACETAMINOPHEN 650 MG RE SUPP: 650.0000 mg | Freq: Four times a day (QID) | RECTAL | Status: DC | PRN

## 2017-06-29 MED ORDER — CEFAZOLIN SODIUM-DEXTROSE 2-4 GM/100ML-% IV SOLN
2.0000 g | Freq: Four times a day (QID) | INTRAVENOUS | Status: AC
  Administered 2017-06-29: 2 g via INTRAVENOUS
  Filled 2017-06-29: qty 100

## 2017-06-29 MED ORDER — LORAZEPAM 0.5 MG PO TABS: 0.5000 mg | ORAL_TABLET | Freq: Two times a day (BID) | ORAL | Status: DC | PRN

## 2017-06-29 MED ORDER — SODIUM CHLORIDE 0.9 % IV SOLN: INTRAVENOUS | Status: DC

## 2017-06-29 MED ORDER — BUPIVACAINE IN DEXTROSE 0.75-8.25 % IT SOLN
INTRATHECAL | Status: DC | PRN
  Administered 2017-06-29: 2 mL via INTRATHECAL

## 2017-06-29 MED ORDER — EPHEDRINE SULFATE 50 MG/ML IJ SOLN
INTRAMUSCULAR | Status: DC | PRN
  Administered 2017-06-29 (×2): 5 mg via INTRAVENOUS

## 2017-06-29 MED ORDER — ORLISTAT 120 MG PO CAPS: 120.0000 mg | ORAL_CAPSULE | Freq: Three times a day (TID) | ORAL | Status: DC

## 2017-06-29 MED ORDER — OXYCODONE HCL 5 MG/5ML PO SOLN: 5.0000 mg | Freq: Once | ORAL | Status: DC | PRN

## 2017-06-29 MED ORDER — POLYETHYLENE GLYCOL 3350 17 G PO PACK
17.0000 g | PACK | Freq: Every day | ORAL | Status: DC | PRN
Start: 2017-06-29 — End: 2017-07-01

## 2017-06-29 MED ORDER — LACTATED RINGERS IV SOLN
INTRAVENOUS | Status: DC | PRN
  Administered 2017-06-29: 08:00:00 via INTRAVENOUS

## 2017-06-29 MED ORDER — HYDROMORPHONE HCL 1 MG/ML IJ SOLN
0.2500 mg | INTRAMUSCULAR | Status: DC | PRN
  Administered 2017-06-29 (×4): 0.5 mg via INTRAVENOUS

## 2017-06-29 MED ORDER — ROPIVACAINE HCL 5 MG/ML IJ SOLN
INTRAMUSCULAR | Status: DC | PRN
  Administered 2017-06-29: 20 mL via PERINEURAL

## 2017-06-29 MED ORDER — PANTOPRAZOLE SODIUM 40 MG PO TBEC
40.0000 mg | DELAYED_RELEASE_TABLET | Freq: Every day | ORAL | Status: DC
  Administered 2017-06-29 – 2017-07-01 (×2): 40 mg via ORAL
  Filled 2017-06-29 (×2): qty 1

## 2017-06-29 MED ORDER — SUCCINYLCHOLINE CHLORIDE 200 MG/10ML IV SOSY
PREFILLED_SYRINGE | INTRAVENOUS | Status: AC
  Filled 2017-06-29: qty 10

## 2017-06-29 MED ORDER — BUPROPION HCL ER (XL) 150 MG PO TB24
300.0000 mg | ORAL_TABLET | Freq: Every day | ORAL | Status: DC
  Administered 2017-06-29 – 2017-07-01 (×3): 300 mg via ORAL
  Filled 2017-06-29 (×3): qty 2

## 2017-06-29 MED ORDER — MAGNESIUM CITRATE PO SOLN: 1.0000 | Freq: Once | ORAL | Status: DC | PRN

## 2017-06-29 MED ORDER — METHOCARBAMOL 500 MG PO TABS
500.0000 mg | ORAL_TABLET | Freq: Four times a day (QID) | ORAL | Status: DC | PRN
  Administered 2017-06-29 – 2017-06-30 (×5): 500 mg via ORAL
  Filled 2017-06-29 (×5): qty 1

## 2017-06-29 MED ORDER — VENLAFAXINE HCL ER 150 MG PO CP24
150.0000 mg | ORAL_CAPSULE | Freq: Every day | ORAL | Status: DC
  Administered 2017-06-30: 150 mg via ORAL
  Filled 2017-06-29: qty 1

## 2017-06-29 MED ORDER — DEXTROSE 5 % IV SOLN
3.0000 g | INTRAVENOUS | Status: AC
  Administered 2017-06-29: 3 g via INTRAVENOUS
  Filled 2017-06-29: qty 3000

## 2017-06-29 MED ORDER — HYDROMORPHONE HCL 1 MG/ML IJ SOLN
INTRAMUSCULAR | Status: AC
  Filled 2017-06-29: qty 1

## 2017-06-29 MED ORDER — HYDROMORPHONE HCL 1 MG/ML IJ SOLN
1.0000 mg | INTRAMUSCULAR | Status: DC | PRN
  Administered 2017-06-29 – 2017-06-30 (×5): 1 mg via INTRAVENOUS
  Filled 2017-06-29 (×4): qty 1

## 2017-06-29 MED ORDER — FENTANYL CITRATE (PF) 100 MCG/2ML IJ SOLN
INTRAMUSCULAR | Status: DC | PRN
  Administered 2017-06-29: 50 ug via INTRAVENOUS

## 2017-06-29 MED ORDER — PROPOFOL 10 MG/ML IV BOLUS
INTRAVENOUS | Status: AC
  Filled 2017-06-29: qty 20

## 2017-06-29 MED ORDER — TRANEXAMIC ACID 1000 MG/10ML IV SOLN
1000.0000 mg | INTRAVENOUS | Status: AC
  Administered 2017-06-29: 1000 mg via INTRAVENOUS
  Filled 2017-06-29: qty 10

## 2017-06-29 MED ORDER — EPHEDRINE 5 MG/ML INJ
INTRAVENOUS | Status: AC
  Filled 2017-06-29: qty 10

## 2017-06-29 MED ORDER — PHENYLEPHRINE HCL 10 MG/ML IJ SOLN
INTRAMUSCULAR | Status: DC | PRN
  Administered 2017-06-29: 80 ug via INTRAVENOUS
  Administered 2017-06-29: 120 ug via INTRAVENOUS

## 2017-06-29 MED ORDER — FUROSEMIDE 40 MG PO TABS
40.0000 mg | ORAL_TABLET | Freq: Two times a day (BID) | ORAL | Status: DC
Start: 2017-06-29 — End: 2017-07-01
  Administered 2017-06-29 – 2017-07-01 (×4): 40 mg via ORAL
  Filled 2017-06-29 (×4): qty 1

## 2017-06-29 MED ORDER — BENZONATATE 100 MG PO CAPS: 100.0000 mg | ORAL_CAPSULE | Freq: Three times a day (TID) | ORAL | Status: DC | PRN

## 2017-06-29 MED ORDER — METHOCARBAMOL 1000 MG/10ML IJ SOLN
500.0000 mg | Freq: Four times a day (QID) | INTRAVENOUS | Status: DC | PRN
  Filled 2017-06-29: qty 5

## 2017-06-29 MED ORDER — DILTIAZEM HCL ER COATED BEADS 120 MG PO CP24
120.0000 mg | ORAL_CAPSULE | Freq: Every day | ORAL | Status: DC
  Administered 2017-06-30 – 2017-07-01 (×2): 120 mg via ORAL
  Filled 2017-06-29 (×2): qty 1

## 2017-06-29 MED ORDER — ONDANSETRON HCL 4 MG/2ML IJ SOLN
INTRAMUSCULAR | Status: AC
  Filled 2017-06-29: qty 2

## 2017-06-29 MED ORDER — INSULIN ASPART 100 UNIT/ML ~~LOC~~ SOLN
0.0000 [IU] | Freq: Three times a day (TID) | SUBCUTANEOUS | Status: DC
  Administered 2017-06-29 – 2017-06-30 (×2): 4 [IU] via SUBCUTANEOUS
  Administered 2017-06-30: 7 [IU] via SUBCUTANEOUS
  Administered 2017-06-30 – 2017-07-01 (×2): 4 [IU] via SUBCUTANEOUS

## 2017-06-29 MED ORDER — PHENYLEPHRINE HCL 10 MG/ML IJ SOLN
INTRAVENOUS | Status: DC | PRN
  Administered 2017-06-29: 25 ug/min via INTRAVENOUS

## 2017-06-29 MED ORDER — INSULIN PEN NEEDLE 33G X 4 MM MISC: 1.0000 | Freq: Four times a day (QID) | Status: DC

## 2017-06-29 MED ORDER — ROCURONIUM BROMIDE 10 MG/ML (PF) SYRINGE
PREFILLED_SYRINGE | INTRAVENOUS | Status: AC
  Filled 2017-06-29: qty 5

## 2017-06-29 MED ORDER — PNEUMOCOCCAL VAC POLYVALENT 25 MCG/0.5ML IJ INJ: 0.5000 mL | INJECTION | INTRAMUSCULAR | Status: DC

## 2017-06-29 MED ORDER — GABAPENTIN 100 MG PO CAPS
100.0000 mg | ORAL_CAPSULE | Freq: Three times a day (TID) | ORAL | Status: DC
  Administered 2017-06-29 – 2017-07-01 (×6): 100 mg via ORAL
  Filled 2017-06-29 (×6): qty 1

## 2017-06-29 MED ORDER — SODIUM CHLORIDE 0.9 % IR SOLN
Status: DC | PRN
  Administered 2017-06-29: 3000 mL

## 2017-06-29 MED ORDER — ONDANSETRON 4 MG PO TBDP: 4.0000 mg | ORAL_TABLET | Freq: Three times a day (TID) | ORAL | Status: DC | PRN

## 2017-06-29 MED ORDER — DOCUSATE SODIUM 100 MG PO CAPS
100.0000 mg | ORAL_CAPSULE | Freq: Two times a day (BID) | ORAL | Status: DC
  Administered 2017-06-29 – 2017-07-01 (×4): 100 mg via ORAL
  Filled 2017-06-29 (×4): qty 1

## 2017-06-29 MED ORDER — POTASSIUM CHLORIDE CRYS ER 20 MEQ PO TBCR
20.0000 meq | EXTENDED_RELEASE_TABLET | Freq: Three times a day (TID) | ORAL | Status: DC
  Administered 2017-06-29 – 2017-07-01 (×5): 20 meq via ORAL
  Filled 2017-06-29 (×5): qty 1

## 2017-06-29 MED ORDER — ESTRADIOL 2 MG PO TABS
2.0000 mg | ORAL_TABLET | Freq: Every day | ORAL | Status: DC
  Administered 2017-06-29 – 2017-06-30 (×2): 2 mg via ORAL
  Filled 2017-06-29 (×2): qty 1

## 2017-06-29 MED ORDER — MOMETASONE FURO-FORMOTEROL FUM 200-5 MCG/ACT IN AERO
2.0000 | INHALATION_SPRAY | Freq: Two times a day (BID) | RESPIRATORY_TRACT | Status: DC
  Administered 2017-06-29 – 2017-07-01 (×4): 2 via RESPIRATORY_TRACT
  Filled 2017-06-29: qty 8.8

## 2017-06-29 MED ORDER — POVIDONE-IODINE 10 % EX SWAB: 2.0000 "application " | Freq: Once | CUTANEOUS | Status: DC

## 2017-06-29 MED ORDER — PROPOFOL 500 MG/50ML IV EMUL
INTRAVENOUS | Status: DC | PRN
  Administered 2017-06-29: 40 ug/kg/min via INTRAVENOUS

## 2017-06-29 MED ORDER — OXYCODONE HCL 5 MG PO TABS: 5.0000 mg | ORAL_TABLET | Freq: Once | ORAL | Status: DC | PRN

## 2017-06-29 MED ORDER — NITROGLYCERIN 0.4 MG SL SUBL: 0.4000 mg | SUBLINGUAL_TABLET | SUBLINGUAL | Status: DC | PRN

## 2017-06-29 MED ORDER — DEXTROSE 5 % IV SOLN: 1.0000 g | Freq: Once | INTRAVENOUS | Status: DC

## 2017-06-29 MED ORDER — FENTANYL CITRATE (PF) 250 MCG/5ML IJ SOLN
INTRAMUSCULAR | Status: AC
  Filled 2017-06-29: qty 5

## 2017-06-29 MED ORDER — ALLOPURINOL 100 MG PO TABS
100.0000 mg | ORAL_TABLET | Freq: Every day | ORAL | Status: DC
  Administered 2017-06-29 – 2017-06-30 (×2): 100 mg via ORAL
  Filled 2017-06-29 (×2): qty 1

## 2017-06-29 MED ORDER — METOCLOPRAMIDE HCL 5 MG PO TABS: 5.0000 mg | ORAL_TABLET | Freq: Three times a day (TID) | ORAL | Status: DC | PRN

## 2017-06-29 MED ORDER — OXYCODONE HCL 5 MG PO TABS
5.0000 mg | ORAL_TABLET | ORAL | Status: DC | PRN
  Administered 2017-06-29 – 2017-07-01 (×11): 10 mg via ORAL
  Filled 2017-06-29 (×10): qty 2

## 2017-06-29 MED ORDER — INSULIN GLARGINE 100 UNIT/ML ~~LOC~~ SOLN
30.0000 [IU] | Freq: Every day | SUBCUTANEOUS | Status: DC
  Administered 2017-06-29 – 2017-06-30 (×2): 30 [IU] via SUBCUTANEOUS
  Filled 2017-06-29 (×2): qty 0.3

## 2017-06-29 MED ORDER — BISACODYL 10 MG RE SUPP: 10.0000 mg | Freq: Every day | RECTAL | Status: DC | PRN

## 2017-06-29 MED ORDER — INSULIN ASPART 100 UNIT/ML ~~LOC~~ SOLN
6.0000 [IU] | Freq: Three times a day (TID) | SUBCUTANEOUS | Status: DC
  Administered 2017-06-29 – 2017-07-01 (×5): 6 [IU] via SUBCUTANEOUS

## 2017-06-29 MED ORDER — VITAMIN B-12 1000 MCG PO TABS
1000.0000 ug | ORAL_TABLET | Freq: Every day | ORAL | Status: DC
  Administered 2017-06-29 – 2017-07-01 (×3): 1000 ug via ORAL
  Filled 2017-06-29 (×3): qty 1

## 2017-06-29 MED ORDER — INFLUENZA VAC SPLIT QUAD 0.5 ML IM SUSY
0.5000 mL | PREFILLED_SYRINGE | INTRAMUSCULAR | Status: AC
  Administered 2017-07-01: 0.5 mL via INTRAMUSCULAR
  Filled 2017-06-29: qty 0.5

## 2017-06-29 MED ORDER — ONDANSETRON HCL 4 MG PO TABS: 4.0000 mg | ORAL_TABLET | Freq: Four times a day (QID) | ORAL | Status: DC | PRN

## 2017-06-29 MED ORDER — SUGAMMADEX SODIUM 200 MG/2ML IV SOLN
INTRAVENOUS | Status: AC
  Filled 2017-06-29: qty 2

## 2017-06-29 SURGICAL SUPPLY — 52 items
BLADE SAGITTAL 25.0X1.19X90 (BLADE) ×2 IMPLANT
BLADE SAW SGTL 13X75X1.27 (BLADE) ×2 IMPLANT
BLADE SURG 21 STRL SS (BLADE) ×4 IMPLANT
BNDG COHESIVE 6X5 TAN STRL LF (GAUZE/BANDAGES/DRESSINGS) ×2 IMPLANT
BNDG GAUZE ELAST 4 BULKY (GAUZE/BANDAGES/DRESSINGS) ×2 IMPLANT
BONE CEMENT PALACOSE (Cement) ×4 IMPLANT
BOWL SMART MIX CTS (DISPOSABLE) ×2 IMPLANT
CAP KNEE TOTAL 3 SIGMA ×2 IMPLANT
CEMENT BONE PALACOSE (Cement) ×2 IMPLANT
COVER SURGICAL LIGHT HANDLE (MISCELLANEOUS) ×4 IMPLANT
CUFF TOURNIQUET SINGLE 34IN LL (TOURNIQUET CUFF) ×2 IMPLANT
CUFF TOURNIQUET SINGLE 44IN (TOURNIQUET CUFF) IMPLANT
DRAPE EXTREMITY T 121X128X90 (DRAPE) ×2 IMPLANT
DRAPE HALF SHEET 40X57 (DRAPES) ×4 IMPLANT
DRAPE U-SHAPE 47X51 STRL (DRAPES) ×2 IMPLANT
DRSG ADAPTIC 3X8 NADH LF (GAUZE/BANDAGES/DRESSINGS) ×2 IMPLANT
DRSG PAD ABDOMINAL 8X10 ST (GAUZE/BANDAGES/DRESSINGS) ×2 IMPLANT
DURAPREP 26ML APPLICATOR (WOUND CARE) ×2 IMPLANT
ELECT REM PT RETURN 9FT ADLT (ELECTROSURGICAL) ×2
ELECTRODE REM PT RTRN 9FT ADLT (ELECTROSURGICAL) ×1 IMPLANT
FACESHIELD WRAPAROUND (MASK) ×2 IMPLANT
GAUZE SPONGE 4X4 12PLY STRL (GAUZE/BANDAGES/DRESSINGS) ×2 IMPLANT
GAUZE SPONGE 4X4 12PLY STRL LF (GAUZE/BANDAGES/DRESSINGS) ×2 IMPLANT
GLOVE BIOGEL PI IND STRL 9 (GLOVE) ×1 IMPLANT
GLOVE BIOGEL PI INDICATOR 9 (GLOVE) ×1
GLOVE SURG ORTHO 9.0 STRL STRW (GLOVE) ×2 IMPLANT
GOWN STRL REUS W/ TWL XL LVL3 (GOWN DISPOSABLE) ×2 IMPLANT
GOWN STRL REUS W/TWL XL LVL3 (GOWN DISPOSABLE) ×4
HANDPIECE INTERPULSE COAX TIP (DISPOSABLE) ×2
KIT BASIN OR (CUSTOM PROCEDURE TRAY) ×2 IMPLANT
KIT ROOM TURNOVER OR (KITS) ×2 IMPLANT
MANIFOLD NEPTUNE II (INSTRUMENTS) ×2 IMPLANT
NEEDLE SPNL 18GX3.5 QUINCKE PK (NEEDLE) ×2 IMPLANT
NS IRRIG 1000ML POUR BTL (IV SOLUTION) ×2 IMPLANT
PACK TOTAL JOINT (CUSTOM PROCEDURE TRAY) ×2 IMPLANT
PACK UNIVERSAL I (CUSTOM PROCEDURE TRAY) ×2 IMPLANT
PAD ABD 8X10 STRL (GAUZE/BANDAGES/DRESSINGS) ×2 IMPLANT
PAD ARMBOARD 7.5X6 YLW CONV (MISCELLANEOUS) ×2 IMPLANT
SET HNDPC FAN SPRY TIP SCT (DISPOSABLE) ×1 IMPLANT
STAPLER VISISTAT 35W (STAPLE) ×2 IMPLANT
SUCTION FRAZIER HANDLE 10FR (MISCELLANEOUS)
SUCTION TUBE FRAZIER 10FR DISP (MISCELLANEOUS) IMPLANT
SUT VIC AB 0 CT1 27 (SUTURE) ×2
SUT VIC AB 0 CT1 27XBRD ANBCTR (SUTURE) ×1 IMPLANT
SUT VIC AB 1 CTX 36 (SUTURE) ×2
SUT VIC AB 1 CTX36XBRD ANBCTR (SUTURE) ×1 IMPLANT
SYR 50ML LL SCALE MARK (SYRINGE) ×2 IMPLANT
TOWEL OR 17X24 6PK STRL BLUE (TOWEL DISPOSABLE) ×2 IMPLANT
TOWEL OR 17X26 10 PK STRL BLUE (TOWEL DISPOSABLE) ×2 IMPLANT
TRAY CATH 16FR W/PLASTIC CATH (SET/KITS/TRAYS/PACK) ×2 IMPLANT
TRAY FOLEY W/METER SILVER 16FR (SET/KITS/TRAYS/PACK) IMPLANT
WRAP KNEE MAXI GEL POST OP (GAUZE/BANDAGES/DRESSINGS) ×2 IMPLANT

## 2017-06-29 NOTE — Op Note (Signed)
DATE OF SURGERY:  06/29/2017  TIME: 10:01 AM  PATIENT NAME:  [name]    AGE: 56 y.o.    PRE-OPERATIVE DIAGNOSIS:  Osteoarthritis Right Knee  POST-OPERATIVE DIAGNOSIS:  Osteoarthritis Right Knee  PROCEDURE:  Procedure(s): RIGHT TOTAL KNEE ARTHROPLASTY  SURGEON: Meridee Score  ASSISTANT: April Green  OPERATIVE IMPLANTS: Depuy , Posterior Stabilized.  Femur size 6, Tibia size 6, Patella size 35 3-peg oval button, with a 5 mm polyethylene insert.   PREOPERATIVE INDICATIONS:   [name] is a 57 y.o. year old female with end stage degenerative arthritis of the knee who failed conservative treatment and elected for Total Knee Arthroplasty.   The risks, benefits, and alternatives were discussed at length including but not limited to the risks of infection, bleeding, nerve injury, stiffness, blood clots, the need for revision surgery, cardiopulmonary complications, among others, and they were willing to proceed.  OPERATIVE DESCRIPTION:  The patient was brought to the operative room and placed in a supine position.  General anesthesia was administered.  IV antibiotics were given.  The lower extremity was prepped and draped in the usual sterile fashion.  Charlie Pitter was used to cover all exposed skin. Time out was performed.    Anterior quadriceps tendon splitting approach was performed.  The patella was everted and osteophytes were removed.  The anterior horn of the medial and lateral meniscus was removed.   The distal femur was opened with the drill and the intramedullary distal femoral cutting jig was utilized, set at 5 degrees valgus resecting 9 mm off the distal femur.  Care was taken to protect the collateral ligaments.  Then the extramedullary tibial cutting jig was utilized set for 3 degree posterior slope.  Care was taken during the cut to protect the medial and collateral ligaments.  The proximal tibia was removed along with the posterior horns of the menisci.  The PCL was  sacrificed.    The extensor gap was measured and was approximately 5 mm.    The distal femoral sizing jig was applied, taking care to avoid notching.  Then the 4-in-1 cutting jig was applied and the anterior and posterior femur was cut, along with the chamfer cuts.  All posterior osteophytes were removed.  The flexion gap was then measured and was symmetric with the extension gap.  The distal femoral preparation using the appropriate jig to prepare the box.  The patella was then measured, and cut with the saw.    The proximal tibia sized and prepared accordingly with the reamer and the punch, and then all components were trialed with the poly insert.  The knee was found to have stable balance and full motion.  The knee was irrigated with normal saline and the knee was soaked with TXA.  The above named components were then cemented into place and all excess cement was removed.  The final polyethylene component was in place during cementation.  The knee was kept in extension until the cement hardened.  The knee was then taken through a range of motion and the patella tracked well and the knee irrigated copiously and the parapatellar and subcutaneous tissue closed with vicryl, and skin closed with staples..  A sterile dressing was applied and patient  was taken to the PACU in stable  condition.  There were no complications.  Total tourniquet time was 28 minutes.

## 2017-06-29 NOTE — Telephone Encounter (Signed)
This is an [facility] patient 

## 2017-06-29 NOTE — Anesthesia Procedure Notes (Signed)
Anesthesia Regional Block: Adductor canal block   Pre-Anesthetic Checklist: ,, timeout performed, Correct Patient, Correct Site, Correct Laterality, Correct Procedure, Correct Position, site marked, Risks and benefits discussed,  Surgical consent,  Pre-op evaluation,  At surgeon's request and post-op pain management  Laterality: Right  Prep: chloraprep       Needles:  Injection technique: Single-shot  Needle Type: Stimiplex     Needle Length: 9cm  Needle Gauge: 21     Additional Needles:   Procedures:,,,, ultrasound used (permanent image in chart),,,,  Narrative:  Start time: 06/29/2017 7:59 AM End time: 06/29/2017 8:04 AM Injection made incrementally with aspirations every 5 mL.  Performed by: Personally  Anesthesiologist: Candida Peeling RAY

## 2017-06-29 NOTE — Anesthesia Postprocedure Evaluation (Signed)
Anesthesia Post Note  Patient: [name]  Procedure(s) Performed: Procedure(s) (LRB): RIGHT TOTAL KNEE ARTHROPLASTY (Right)     Patient location during evaluation: PACU Anesthesia Type: Spinal Level of consciousness: oriented and awake and alert Pain management: pain level controlled Vital Signs Assessment: post-procedure vital signs reviewed and stable Respiratory status: spontaneous breathing and respiratory function stable Cardiovascular status: blood pressure returned to baseline and stable Postop Assessment: no headache, no backache and no apparent nausea or vomiting Anesthetic complications: no    Last Vitals:  Vitals:   06/29/17 1055 06/29/17 1110  BP: 130/68 130/69  Pulse: 64 65  Resp: 17 15  Temp:    SpO2: 100% 97%    Last Pain:  Vitals:   06/29/17 1115  TempSrc:   PainSc: [place]

## 2017-06-29 NOTE — H&P (Signed)
TOTAL KNEE ADMISSION H&P  Patient is being admitted for right total knee arthroplasty.  Subjective:  Chief Complaint:right knee pain.  HPI: [name], 56 y.o. female, has a history of pain and functional disability in the right knee due to arthritis and has failed non-surgical conservative treatments for greater than 12 weeks to includeNSAID's and/or analgesics, corticosteriod injections, viscosupplementation injections and activity modification.  Onset of symptoms was gradual, starting 8 years ago with gradually worsening course since that time. The patient noted no past surgery on the right knee(s).  Patient currently rates pain in the right knee(s) at 8 out of 10 with activity. Patient has night pain, worsening of pain with activity and weight bearing, pain that interferes with activities of daily living, pain with passive range of motion, crepitus and joint swelling.  Patient has evidence of subchondral cysts, subchondral sclerosis, periarticular osteophytes, joint subluxation and joint space narrowing by imaging studies. This patient has had avascular necrosis of the knee. There is no active infection.  Patient Active Problem List   Diagnosis Date Noted  . Sprain of calcaneofibular ligament of right ankle 06/06/2017  . Acute left ankle pain 01/26/2017  . Osteoarthritis of right knee 01/26/2017  . Multiple allergies 10/14/2016  . Onychomycosis 10/27/2015  . Osteomyelitis of left foot ([place]) 05/29/2015  . CKD (chronic kidney disease) stage 3, GFR 30-59 ml/min 04/27/2015  . MRSA (methicillin resistant staph aureus) culture positive 03/27/2015  . Wound infection complicating hardware ([place]) 03/27/2015  . Fatty liver 09/30/2014  . Hot flashes 07/15/2014  . Abnormal SPEP 04/17/2014  . Fracture of left leg 04/17/2014  . Cushingoid side effect of steroids ([facility]) 04/17/2014  . Internal hemorrhoids   . Depression   . Preoperative clearance 03/25/2014  . Obstructive chronic bronchitis  without exacerbation ([place]) 09/18/2013  . Chest pain 04/11/2013  . Hypertensive heart disease with chronic diastolic congestive heart failure ([place])   . Solitary pulmonary nodule, on CT 02/2013 - stable over 2 years in 2015 02/20/2013  . Gout 08/20/2010  . Anemia 09/18/2009  . Morbid obesity ([place]) 06/04/2009  . Sleep apnea 04/21/2009  . Sarcoidosis of lung ([place]) 04/10/2007  . Hyperlipidemia 08/21/2006  . Essential hypertension 08/21/2006  . GERD 08/21/2006  . Type II diabetes mellitus with neurological manifestations, uncontrolled ([place]) 08/21/2006   Past Medical History:  Diagnosis Date  . Abnormal SPEP 04/17/2014  . Acute left ankle pain 01/26/2017  . ANEMIA-UNSPECIFIED 09/18/2009  . Chronic diastolic heart failure, NYHA class 2 (H[facility])    LVEDP roughly 20% by cath  . COPD (chronic obstructive pulmonary disease) ([place])   . Depression   . DIABETES MELLITUS, TYPE II 08/21/2006  . Diabetic osteomyelitis ([place]) 05/29/2015  . Fracture of 5th metatarsal    non union  . GERD 08/21/2006  . GOUT 08/20/2010  . Hx of umbilical hernia repair   . HYPERLIPIDEMIA 08/21/2006  . HYPERTENSION 08/21/2006  . Infection of wound due to methicillin resistant Staphylococcus aureus (MRSA)   . Internal hemorrhoids   . Multiple allergies 10/14/2016  . OBESITY 06/04/2009  . Onychomycosis 10/27/2015  . Osteomyelitis of left foot ([place]) 05/29/2015  . Pulmonary sarcoidosis ([place])    Followed locally by pulmonology, but also by Dr. Casper Harrison at [facility]  . Right knee pain 01/26/2017  . Vocal cord dysfunction   . Wears partial dentures     Past Surgical History:  Procedure Laterality Date  . ABDOMINAL HYSTERECTOMY    . APPENDECTOMY    . BLADDER SUSPENSION  11/11/2011   Procedure: TRANSVAGINAL TAPE (TVT) PROCEDURE;  Surgeon: Olga Millers, MD;  Location: [facility];  Service: Gynecology;  Laterality: N/A;  . CARDIAC CATHETERIZATION  07/2010   LVEF 50-55% WITH VERY MILD GLOBAL HYPOKINESIA; ESSENTIALLY NORMAL  CORONARY ARTERIES; NORMAL LV FUNCTION  . CAROTIDS  02/18/11   CAROTID DUPLEX; VERTEBRALS ARE PATENT WITH ANTEGRADE FLOW. ICA/CCA RATIO 1.61 ON RIGHT AND 0.75 ON LEFT  . CHOLECYSTECTOMY  1984  . CYSTOSCOPY  11/11/2011   Procedure: CYSTOSCOPY;  Surgeon: Olga Millers, MD;  Location: [facility];  Service: Gynecology;  Laterality: N/A;  . DOPPLER ECHOCARDIOGRAPHY  02/12/2013   LV FUNCTION, SIZE NORMAL; MILD CONCENTRIC LVH; EST EF 55-65%; WALL MOTION NORMAL  . FRACTURE SURGERY     foot  . HERNIA REPAIR    . I&D EXTREMITY Left 06/27/2015   Procedure: Partial Excision Left Calcaneus, Place Antibiotic Beads, and Wound VAC;  Surgeon: Newt Minion, MD;  Location: [facility];  Service: Orthopedics;  Laterality: Left;  . KNEE ARTHROSCOPY     right  . LEFT AND RIGHT HEART CATHETERIZATION WITH CORONARY ANGIOGRAM N/A 04/23/2013   Procedure: LEFT AND RIGHT HEART CATHETERIZATION WITH CORONARY ANGIOGRAM;  Surgeon: Leonie Man, MD;  Location[facility];  Service: Cardiovascular;  Laterality: N/A;  . LEFT HEART CATH AND CORONARY ANGIOGRAPHY N/A 03/11/2017   Procedure: Left Heart Cath and Coronary Angiography;  Surgeon: Sherren Mocha, MD;  Location: [facility];  Service: Cardiovascular;  Laterality: N/A;  . LexiScan Myoview  03/09/2013   EF 50%; NORMAL MYOCARDIAL PERFUSION STUDY - breast attenuation  . METATARSAL OSTEOTOMY WITH OPEN REDUCTION INTERNAL FIXATION (ORIF) METATARSAL WITH FUSION Left 04/09/2014   Procedure: LEFT FOOT FRACTURE OPEN TREATMENT METATARSAL INCLUDES INTERNAL FIXATION EACH;  Surgeon: Lorn Junes, MD;  Location: [facility];  Service: Orthopedics;  Laterality: Left;  . NISSEN FUNDOPLICATION  0272  . Right and left CARDIAC CATHETERIZATION  04/23/2013   Angiographic normal coronaries; LVEDP 20 mmHg, PCWP 12-14 mmHg, RAP 12 mmHg.; Fick CO/CI 4.9/2.2  . TUBAL LIGATION     with reversal in 1994  . VENTRAL HERNIA REPAIR      Prescriptions Prior to Admission  Medication Sig  Dispense Refill Last Dose  . allopurinol (ZYLOPRIM) 100 MG tablet Take 100 mg by mouth at bedtime.    06/28/2017 at Unknown time  . aspirin EC 81 MG tablet Take 81 mg by mouth daily.    week  . atorvastatin (LIPITOR) 40 MG tablet TAKE 1 TAB BY MOUTH ONCE DAILY (Patient taking differently: TAKE 1 TABLET (40 MG) BY MOUTH ONCE DAILY) 90 tablet 3 06/28/2017 at Unknown time  . buPROPion (WELLBUTRIN XL) 300 MG 24 hr tablet TAKE 1 TABLET (300 MG TOTAL) BY MOUTH DAILY. 90 tablet 3 06/28/2017 at Unknown time  . carvedilol (COREG) 25 MG tablet Take 1 tablet (25 mg total) by mouth 2 (two) times daily with a meal. 180 tablet 3 06/29/2017 at Unknown time  . DEXILANT 60 MG capsule TAKE 1 CAPSULE BY MOUTH DAILY (Patient taking differently: TAKE 1 CAPSULE (60 MG) BY MOUTH DAILY) 30 capsule 1 06/28/2017 at Unknown time  . diclofenac sodium (VOLTAREN) 1 % GEL APPLY 2 GRAMS TOPICALLY 4 TIMES A DAY (Patient taking differently: APPLY 2 GRAMS TOPICALLY 4 TIMES A DAY AS NEEDED FOR KNEE PAIN) 100 g 0 Past Month at Unknown time  . diltiazem (CARDIZEM CD) 120 MG 24 hr capsule Take 1 capsule (120 mg total) by mouth  daily. 90 capsule 3 06/29/2017 at Unknown time  . estradiol (ESTRACE) 2 MG tablet Take 2 mg by mouth at bedtime.    06/28/2017 at Unknown time  . fenofibrate (TRICOR) 145 MG tablet TAKE 1 TABLET (145 MG TOTAL) BY MOUTH DAILY. 30 tablet 6 06/28/2017 at Unknown time  . fluconazole (DIFLUCAN) 150 MG tablet Take 1 tablet (150 mg total) by mouth daily. 3 tablet 0 Taking  . fluticasone (FLONASE) 50 MCG/ACT nasal spray Place 2 sprays into both nostrils daily. 48 g 3 06/28/2017 at Unknown time  . furosemide (LASIX) 40 MG tablet TAKE 1 TABLET BY MOUTH TWICE A DAY (Patient taking differently: TAKE 1 TABLET (40 MG) BY MOUTH TWICE A DAY) 60 tablet 11 06/28/2017 at Unknown time  . gabapentin (NEURONTIN) 100 MG capsule TAKE 1 CAPSULE BY MOUTH THREE TIMES A DAY 90 capsule 0 06/29/2017 at Unknown time  . insulin aspart (NOVOLOG FLEXPEN) 100  UNIT/ML FlexPen Inject 22-27 Units into the skin 3 (three) times daily with meals. 30 mL 5 06/28/2017 at Unknown time  . Insulin Glargine (TOUJEO MAX SOLOSTAR) 300 UNIT/ML SOPN Inject 45 Units into the skin at bedtime. 3 pen 11 06/28/2017 at Unknown time  . losartan (COZAAR) 50 MG tablet TAKE 1 TABLET (50 MG TOTAL) BY MOUTH DAILY.  3 06/28/2017 at Unknown time  . metFORMIN (GLUCOPHAGE) 1000 MG tablet TAKE 1 TABLET BY MOUTH TWICE A DAY WITH FOOD (Patient taking differently: TAKE 1 TABLET (1000 MG) BY MOUTH TWICE A DAY WITH FOOD) 180 tablet 0 06/28/2017 at Unknown time  . mometasone-formoterol (DULERA) 200-5 MCG/ACT AERO Inhale 2 puffs into the lungs 2 (two) times daily. 13 g 5 06/29/2017 at Unknown time  . nitroGLYCERIN (NITROSTAT) 0.4 MG SL tablet Place 1 tablet (0.4 mg total) under the tongue every 5 (five) minutes as needed for chest pain. Reported on 01/05/2016 100 tablet 0 Taking  . orlistat (XENICAL) 120 MG capsule Take 1 capsule (120 mg total) by mouth 3 (three) times daily with meals. 90 capsule 2 Past Month at Unknown time  . PARoxetine Mesylate (BRISDELLE) 7.5 MG CAPS Take 7.5 mg by mouth daily.   06/28/2017 at Unknown time  . Potassium Chloride ER 20 MEQ TBCR Take 1 tablet by mouth 3 (three) times daily. 90 tablet 3 06/28/2017 at Unknown time  . predniSONE (DELTASONE) 5 MG tablet Take 5 mg by mouth daily with breakfast.   06/28/2017 at Unknown time  . traMADol (ULTRAM) 50 MG tablet Take 1 tablet (50 mg total) by mouth every 12 (twelve) hours as needed for moderate pain or severe pain. 30 tablet 2 Past Month at Unknown time  . venlafaxine XR (EFFEXOR-XR) 75 MG 24 hr capsule Take 2 capsules (150 mg total) by mouth at bedtime.   06/29/2017 at Unknown time  . vitamin B-12 (CYANOCOBALAMIN) 1000 MCG tablet Take 1,000 mcg by mouth daily.   week  . albuterol (PROVENTIL HFA;VENTOLIN HFA) 108 (90 BASE) MCG/ACT inhaler Inhale 1-2 puffs into the lungs every 6 (six) hours as needed for wheezing or shortness of  breath. 8 g 5 More than a month at Unknown time  . albuterol (PROVENTIL) (2.5 MG/3ML) 0.083% nebulizer solution Take 2.5 mg by nebulization every 6 (six) hours as needed for wheezing or shortness of breath.   More than a month at Unknown time  . BAYER MICROLET LANCETS lancets Use as instructed to check sugar 3 times daily 300 each 5 Taking  . benzonatate (TESSALON) 100 MG capsule Take 100 mg  by mouth 3 (three) times daily as needed for cough. Reported on 01/05/2016   More than a month at Unknown time  . Blood Glucose Monitoring Suppl (BAYER CONTOUR MONITOR) w/Device KIT Use daily to check sugar 1 kit 0 Taking  . glucose blood (BAYER CONTOUR TEST) test strip Use as instructed to check sugar 3 times daily 300 each 5 Taking  . Insulin Pen Needle 33G X 4 MM MISC 1 each by Does not apply route 4 (four) times daily. 200 each 11 Taking  . LORazepam (ATIVAN) 0.5 MG tablet Take 0.5 mg by mouth every 12 (twelve) hours as needed for anxiety.   More than a month at Unknown time  . ondansetron (ZOFRAN-ODT) 4 MG disintegrating tablet Take 4 mg by mouth every 8 (eight) hours as needed for nausea or vomiting. Reported on 01/05/2016  0 More than a month at Unknown time   Allergies  Allergen Reactions  . Methotrexate Other (See Comments)    Peri-oral and buccal lesions  . Vancomycin Other (See Comments)    Nephrotoxicity   . Clindamycin/Lincomycin Nausea And Vomiting and Rash  . Chlorhexidine Itching  . Doxycycline Rash  . Lisinopril Cough  . Teflaro [Ceftaroline] Rash    Social History  Substance Use Topics  . Smoking status: Never Smoker  . Smokeless tobacco: Never Used  . Alcohol use No    Family History  Problem Relation Age of Onset  . Diabetes Father   . Heart attack Father 27  . Coronary artery disease Father   . Heart failure Father   . COPD Mother   . Emphysema Mother   . Asthma Mother   . Heart failure Mother   . Breast cancer Mother   . Heart attack Maternal Grandfather   .  Sarcoidosis Maternal Uncle   . Lung cancer Brother   . Diabetes Brother   . Colon cancer Neg Hx      Review of Systems  All other systems reviewed and are negative.   Objective:  Physical Exam  Vital signs in last 24 hours: Temp:  [97.8 F (36.6 C)] 97.8 F (36.6 C) (09/19 0636) Pulse Rate:  [75-76] 75 (09/19 0636) Resp:  [20] 20 (09/19 0636) BP: (133-140)/(40-72) 133/40 (09/19 0636) SpO2:  [96 %-99 %] 99 % (09/19 0636) Weight:  [256 lb 3.2 oz (116.2 kg)] 256 lb 3.2 oz (116.2 kg) (09/19 0636)  Labs:   Estimated body mass index is 41.35 kg/m as calculated from the following:   Height as of 06/17/17: 5' 6"  (1.676 m).   Weight as of this encounter: 256 lb 3.2 oz (116.2 kg).   Imaging Review Plain radiographs demonstrate moderate degenerative joint disease of the right knee(s). The overall alignment ismild varus. The bone quality appears to be adequate for age and reported activity level.  Assessment/Plan:  End stage arthritis, right knee   The patient history, physical examination, clinical judgment of the provider and imaging studies are consistent with end stage degenerative joint disease of the right knee(s) and total knee arthroplasty is deemed medically necessary. The treatment options including medical management, injection therapy arthroscopy and arthroplasty were discussed at length. The risks and benefits of total knee arthroplasty were presented and reviewed. The risks due to aseptic loosening, infection, stiffness, patella tracking problems, thromboembolic complications and other imponderables were discussed. The patient acknowledged the explanation, agreed to proceed with the plan and consent was signed. Patient is being admitted for inpatient treatment for surgery, pain control, PT, OT, prophylactic  antibiotics, VTE prophylaxis, progressive ambulation and ADL's and discharge planning. The patient is planning to be discharged to skilled nursing facility

## 2017-06-29 NOTE — Anesthesia Procedure Notes (Signed)
Spinal  Patient location during procedure: OR Start time: 06/29/2017 8:36 AM End time: 06/29/2017 8:41 AM Staffing Anesthesiologist: Candida Peeling RAY Performed: anesthesiologist  Preanesthetic Checklist Completed: patient identified, site marked, surgical consent, pre-op evaluation, timeout performed, IV checked, risks and benefits discussed and monitors and equipment checked Spinal Block Patient position: sitting Prep: ChloraPrep Patient monitoring: heart rate, cardiac monitor, continuous pulse ox and blood pressure Approach: midline Location: L3-4 Injection technique: single-shot Needle Needle type: Quincke  Needle gauge: 22 G Needle length: 9 cm

## 2017-06-29 NOTE — Anesthesia Preprocedure Evaluation (Signed)
Anesthesia Evaluation  Patient identified by MRN, date of birth, ID band Patient awake    Reviewed: Allergy & Precautions, H&P , NPO status , Patient's Chart, lab work & pertinent test results, reviewed documented beta blocker date and time   Airway Mallampati: II  TM Distance: >3 FB Neck ROM: Full    Dental no notable dental hx. (+) Upper Dentures, Lower Dentures, Dental Advisory Given   Pulmonary shortness of breath, sleep apnea , COPD,  COPD inhaler,    Pulmonary exam normal breath sounds clear to auscultation       Cardiovascular hypertension, On Medications and On Home Beta Blockers +CHF   Rhythm:Regular Rate:Normal     Neuro/Psych Depression negative neurological ROS  negative psych ROS   GI/Hepatic Neg liver ROS, GERD  Medicated and Controlled,  Endo/Other  diabetes, Type 2, Insulin Dependent, Oral Hypoglycemic AgentsMorbid obesity  Renal/GU Renal InsufficiencyRenal disease     Musculoskeletal  (+) Arthritis ,   Abdominal   Peds  Hematology  (+) anemia ,   Anesthesia Other Findings   Reproductive/Obstetrics negative OB ROS                             Anesthesia Physical  Anesthesia Plan  ASA: III  Anesthesia Plan: Spinal   Post-op Pain Management:  Regional for Post-op pain   Induction: Intravenous  PONV Risk Score and Plan: 2 and Ondansetron and Midazolam  Airway Management Planned: Simple Face Mask  Additional Equipment:   Intra-op Plan:   Post-operative Plan:   Informed Consent: I have reviewed the patients History and Physical, chart, labs and discussed the procedure including the risks, benefits and alternatives for the proposed anesthesia with the patient or authorized representative who has indicated his/her understanding and acceptance.   Dental advisory given  Plan Discussed with: CRNA  Anesthesia Plan Comments:         Anesthesia Quick  Evaluation

## 2017-06-29 NOTE — Evaluation (Signed)
Physical Therapy Evaluation Patient Details Name: [name] MRN: [id number] DOB: 1961/07/12 Today's Date: 06/29/2017   History of Present Illness  [name] a 56 y.o. female now s/p elective R TKA. Pertinent PMH includes pulmonary sarcoidosis on prednisone, DM, HTN, COPD, obesity, GERD, gout, depression.     Clinical Impression  Pt presents with an overall decrease in functional mobility secondary to above. PTA, pt indep and lives at home with husband who will be available for 24/7 assist initially upon d/c. Educ on precautions, positioning, therex, and importance of mobility. Today, pt required minA for bed mobility; able to take steps with RW and min guard for balance. Further mobility limited secondary to increased pain. Pt would benefit from continued acute PT services to maximize functional mobility and independence prior to d/c with HHPT.     Follow Up Recommendations DC plan and follow up therapy as arranged by surgeon;Home health PT    Equipment Recommendations  Rolling walker with 5" wheels    Recommendations for Other Services OT consult     Precautions / Restrictions Precautions Precautions: Knee Precaution Booklet Issued: No Precaution Comments: Verbally reviewed precautions; handout for supine therex exercise Restrictions Weight Bearing Restrictions: Yes RLE Weight Bearing: Weight bearing as tolerated      Mobility  Bed Mobility Overal bed mobility: Needs Assistance Bed Mobility: Supine to Sit;Sit to Supine     Supine to sit: Min assist;HOB elevated Sit to supine: Min assist   General bed mobility comments: MinA to assist RLE out of/into bed. Pt mod indep with use of bed rails to scoot up in bed  Transfers Overall transfer level: Needs assistance Equipment used: Rolling walker (2 wheeled) Transfers: Sit to/from Stand Sit to Stand: Min guard         General transfer comment: Stood with RW and min guard for balance; cues for technique and hand  placement on RW  Ambulation/Gait Ambulation/Gait assistance: Min guard Ambulation Distance (Feet): 5 Feet Assistive device: Rolling walker (2 wheeled) Gait Pattern/deviations: Step-to pattern;Decreased weight shift to right;Antalgic;Trunk flexed Gait velocity: Decreased Gait velocity interpretation: <1.8 ft/sec, indicative of risk for recurrent falls General Gait Details: Able to take a few steps forwards/backwards at bedside with RW and min guard for balance; further mobility limited secondary to increased pain. Step-to pattern with decreased WB on RLE secondary to pain. Cues for technique  Stairs            Wheelchair Mobility    Modified Rankin (Stroke Patients Only)       Balance Overall balance assessment: Needs assistance Sitting-balance support: No upper extremity supported;Feet supported Sitting balance-Leahy Scale: Good     Standing balance support: Bilateral upper extremity supported;During functional activity Standing balance-Leahy Scale: Poor Standing balance comment: Reliant on BUE support                             Pertinent Vitals/Pain Pain Assessment: Faces Faces Pain Scale: Hurts even more Pain Location: R knee with WB Pain Descriptors / Indicators: Aching;Grimacing Pain Intervention(s): Limited activity within patient's tolerance;Monitored during session;Premedicated before session    Home Living Family/patient expects to be discharged to:: Private residence Living Arrangements: Spouse/significant other Available Help at Discharge: Family;Available 24 hours/day Type of Home: House Home Access: Stairs to enter Entrance Stairs-Rails: Right Entrance Stairs-Number of Steps: 2 Home Layout: One level        Prior Function Level of Independence: Independent  Hand Dominance        Extremity/Trunk Assessment   Upper Extremity Assessment Upper Extremity Assessment: Overall WFL for tasks assessed    Lower  Extremity Assessment Lower Extremity Assessment: RLE deficits/detail RLE Deficits / Details: s/p R TKA; hip flexion grossly 3/5 RLE: Unable to fully assess due to pain       Communication   Communication: No difficulties  Cognition Arousal/Alertness: Awake/alert Behavior During Therapy: WFL for tasks assessed/performed Overall Cognitive Status: Within Functional Limits for tasks assessed                                        General Comments General comments (skin integrity, edema, etc.): Husband present throughout session    Exercises Total Joint Exercises Ankle Circles/Pumps: AROM;Both;10 reps;Supine Quad Sets: AROM;Right;5 reps;Supine Heel Slides: AAROM;Right;5 reps;Supine   Assessment/Plan    PT Assessment Patient needs continued PT services  PT Problem List Decreased strength;Decreased range of motion;Decreased activity tolerance;Decreased balance;Decreased mobility;Decreased knowledge of use of DME;Decreased knowledge of precautions;Pain       PT Treatment Interventions DME instruction;Gait training;Stair training;Functional mobility training;Therapeutic activities;Therapeutic exercise;Balance training;Patient/family education    PT Goals (Current goals can be found in the Care Plan section)  Acute Rehab PT Goals Patient Stated Goal: Return home with decreased pain PT Goal Formulation: With patient Time For Goal Achievement: 07/13/17 Potential to Achieve Goals: Good    Frequency 7X/week   Barriers to discharge        Co-evaluation               AM-PAC PT "6 Clicks" Daily Activity  Outcome Measure Difficulty turning over in bed (including adjusting bedclothes, sheets and blankets)?: Unable Difficulty moving from lying on back to sitting on the side of the bed? : Unable Difficulty sitting down on and standing up from a chair with arms (e.g., wheelchair, bedside commode, etc,.)?: A Little Help needed moving to and from a bed to chair  (including a wheelchair)?: A Little Help needed walking in hospital room?: A Little Help needed climbing 3-5 steps with a railing? : A Lot 6 Click Score: 13    End of Session Equipment Utilized During Treatment: Gait belt Activity Tolerance: Patient tolerated treatment well;Patient limited by pain Patient left: in bed;with call bell/phone within reach;with family/visitor present Nurse Communication: Mobility status PT Visit Diagnosis: Other abnormalities of gait and mobility (R26.89);Pain Pain - Right/Left: Right Pain - part of body: Knee    Time: 1722-1735 PT Time Calculation (min) (ACUTE ONLY): 13 min   Charges:   PT Evaluation $PT Eval Moderate Complexity: 1 Mod     PT G Codes:       Mabeline Caras, PT, DPT Acute Rehab Services  Pager: [place] 06/29/2017, 5:42 PM

## 2017-06-29 NOTE — Transfer of Care (Signed)
Immediate Anesthesia Transfer of Care Note  Patient: [name]  Procedure(s) Performed: Procedure(s): RIGHT TOTAL KNEE ARTHROPLASTY (Right)  Patient Location: PACU  Anesthesia Type:MAC, Regional and Spinal  Level of Consciousness: awake, alert , oriented and patient cooperative  Airway & Oxygen Therapy: Patient Spontanous Breathing and Patient connected to nasal cannula oxygen  Post-op Assessment: Report given to RN and Post -op Vital signs reviewed and stable  Post vital signs: Reviewed and stable  Last Vitals:  Vitals:   06/29/17 0636 06/29/17 1010  BP: (!) 133/40 111/67  Pulse: 75 69  Resp: 20 18  Temp: 36.6 C 36.5 C  SpO2: 99% 100%    Last Pain:  Vitals:   06/29/17 0636  TempSrc: Oral  PainSc: 0-No pain      Patients Stated Pain Goal: 3 (25/42/70 6237)  Complications: No apparent anesthesia complications

## 2017-06-29 NOTE — Addendum Note (Signed)
Addendum  created 06/29/17 1219 by Lynda Rainwater, MD   Anesthesia Intra Blocks edited, Anesthesia Intra Meds edited, Child order released for a procedure order, Sign clinical note

## 2017-06-30 ENCOUNTER — Encounter (HOSPITAL_COMMUNITY): Payer: Self-pay | Admitting: Orthopedic Surgery

## 2017-06-30 LAB — GLUCOSE, CAPILLARY
Glucose-Capillary: 152 mg/dL — ABNORMAL HIGH (ref 65–99)
Glucose-Capillary: 194 mg/dL — ABNORMAL HIGH (ref 65–99)
Glucose-Capillary: 234 mg/dL — ABNORMAL HIGH (ref 65–99)
Glucose-Capillary: 164 mg/dL — ABNORMAL HIGH (ref 65–99)

## 2017-06-30 NOTE — Progress Notes (Signed)
Patient ID: [name], female   DOB: 1961/02/08, 56 y.o.   MRN: [id number] Postoperative day 1 total knee arthroplasty right knee. Patient has no complaints other than pain. Her blood glucose is running in a good range. We will continue with physical therapy possibly discharge tomorrow or Saturday depending on her functional level.

## 2017-06-30 NOTE — Progress Notes (Signed)
Physical Therapy Treatment Patient Details Name: [name] MRN: [id number] DOB: Jul 15, 1961 Today's Date: 06/30/2017    History of Present Illness [name] a 56 y.o. female now s/p elective R TKA. Pertinent PMH includes pulmonary sarcoidosis on prednisone, DM, HTN, COPD, obesity, GERD, gout, depression.     PT Comments    Pt performed increased gait this afternoon and able to ambulate into the bathroom as well to toilet.  Pt resting in bed post session supine with R knee in supported extension and cold pack applied to R knee.  Plan next session for stair training in prep for d/c home.    Follow Up Recommendations  DC plan and follow up therapy as arranged by surgeon;Home health PT     Equipment Recommendations  Rolling walker with 5" wheels    Recommendations for Other Services OT consult     Precautions / Restrictions Precautions Precautions: Knee Precaution Booklet Issued: No Precaution Comments: Verbally reviewed precautions Restrictions Weight Bearing Restrictions: Yes RLE Weight Bearing: Weight bearing as tolerated    Mobility  Bed Mobility Overal bed mobility: Needs Assistance Bed Mobility: Sit to Supine       Sit to supine: Min assist   General bed mobility comments: Pt required min assist to lift RLE into bed against gravity.    Transfers Overall transfer level: Needs assistance Equipment used: Rolling walker (2 wheeled) Transfers: Sit to/from Stand Sit to Stand: Min guard         General transfer comment: Cues for sequencing and hand placement to and from seated surface.  Pt performed from recliner, and BSC to bed.    Ambulation/Gait Ambulation/Gait assistance: Min guard Ambulation Distance (Feet): 100 Feet Assistive device: Rolling walker (2 wheeled) Gait Pattern/deviations: Step-to pattern;Decreased stride length;Antalgic;Trunk flexed;Shuffle Gait velocity: Decreased Gait velocity interpretation: Below normal speed for age/gender General  Gait Details: Cues for upper trunk control, gait symmetry, RW positioning.  Pt required frequent rest breaks due to increased WOB.  Pursed lip breathing instructed during standing rest breaks.     Stairs            Wheelchair Mobility    Modified Rankin (Stroke Patients Only)       Balance Overall balance assessment: Needs assistance Sitting-balance support: Feet supported;Single extremity supported Sitting balance-Leahy Scale: Good       Standing balance-Leahy Scale: Fair Standing balance comment: Reliant on BUE support                            Cognition Arousal/Alertness: Lethargic;Suspect due to medications Behavior During Therapy: [facility] for tasks assessed/performed Overall Cognitive Status: Within Functional Limits for tasks assessed                                 General Comments: Pt intermittently falling asleep during therapy.        Exercises Total Joint Exercises Ankle Circles/Pumps: AROM;Both;10 reps;Supine Quad Sets: AROM;Right;Supine;10 reps Short Arc Quad: AROM;Right;10 reps;Supine Heel Slides: AAROM;Supine;Right;10 reps Hip ABduction/ADduction: AAROM;Right;10 reps;Supine Straight Leg Raises: AAROM;Right;10 reps;Supine Goniometric ROM: R knee flex 0-78 degrees flexion.      General Comments        Pertinent Vitals/Pain Pain Assessment: 0-10 Pain Score: 5  Pain Location: R knee with activity Pain Descriptors / Indicators: Aching;Grimacing Pain Intervention(s): Monitored during session;Repositioned;Ice applied    Home Living  Prior Function            PT Goals (current goals can now be found in the care plan section) Acute Rehab PT Goals Patient Stated Goal: Return home with decreased pain Potential to Achieve Goals: Good Progress towards PT goals: Progressing toward goals    Frequency    7X/week      PT Plan Current plan remains appropriate    Co-evaluation               AM-PAC PT "6 Clicks" Daily Activity  Outcome Measure  Difficulty turning over in bed (including adjusting bedclothes, sheets and blankets)?: Unable Difficulty moving from lying on back to sitting on the side of the bed? : Unable Difficulty sitting down on and standing up from a chair with arms (e.g., wheelchair, bedside commode, etc,.)?: Unable Help needed moving to and from a bed to chair (including a wheelchair)?: A Little Help needed walking in hospital room?: A Little Help needed climbing 3-5 steps with a railing? : A Little 6 Click Score: 12    End of Session Equipment Utilized During Treatment: Gait belt Activity Tolerance: Patient tolerated treatment well;Patient limited by pain Patient left: in bed;with call bell/phone within reach;with family/visitor present Nurse Communication: Mobility status PT Visit Diagnosis: Other abnormalities of gait and mobility (R26.89);Pain Pain - Right/Left: Right Pain - part of body: Knee     Time: 3295-1884 PT Time Calculation (min) (ACUTE ONLY): 34 min  Charges:  $Gait Training: 8-22 mins $Therapeutic Exercise: 8-22 mins                    G Codes:       Governor Rooks, PTA pager [phone]    Cristela Blue 06/30/2017, 4:36 PM

## 2017-06-30 NOTE — Progress Notes (Signed)
Physical Therapy Treatment Patient Details Name: [name] MRN: [id number] DOB: 1961/06/12 Today's Date: 06/30/2017    History of Present Illness [name] a 56 y.o. female now s/p elective R TKA. Pertinent PMH includes pulmonary sarcoidosis on prednisone, DM, HTN, COPD, obesity, GERD, gout, depression.     PT Comments    Pt sleepy and lethargic during session.  Pt able to advance gait distance with good focus but during supine exercises patient drifting to sleep and required max cues to maintain focus on task at hand.  Plan next session to continue gait training and strengthening exercises in prep for d/c home.     Follow Up Recommendations  DC plan and follow up therapy as arranged by surgeon;Home health PT     Equipment Recommendations  Rolling walker with 5" wheels    Recommendations for Other Services OT consult     Precautions / Restrictions Precautions Precautions: Knee Precaution Booklet Issued: No Precaution Comments: Verbally reviewed precautions Restrictions Weight Bearing Restrictions: Yes RLE Weight Bearing: Weight bearing as tolerated    Mobility  Bed Mobility               General bed mobility comments: Pt sitting in recliner chair on arrival.    Transfers Overall transfer level: Needs assistance Equipment used: Rolling walker (2 wheeled) Transfers: Sit to/from Stand Sit to Stand: Min guard         General transfer comment: Pt slow and guarded, required cues to push from seated surface.  Pt tolerated sit to stand with increased time.    Ambulation/Gait Ambulation/Gait assistance: Min assist Ambulation Distance (Feet): 75 Feet Assistive device: Rolling walker (2 wheeled) Gait Pattern/deviations: Step-to pattern;Decreased stride length;Antalgic;Trunk flexed;Shuffle Gait velocity: Decreased   General Gait Details: Cues for upper trunk control, gait symmetry, RW positioning.     Stairs            Wheelchair Mobility    Modified  Rankin (Stroke Patients Only)       Balance Overall balance assessment: Needs assistance   Sitting balance-Leahy Scale: Good       Standing balance-Leahy Scale: Fair Standing balance comment: Reliant on BUE support                            Cognition Arousal/Alertness: Lethargic;Suspect due to medications Behavior During Therapy: [facility] for tasks assessed/performed Overall Cognitive Status: Within Functional Limits for tasks assessed                                 General Comments: Pt intermittently falling asleep during therapy.        Exercises Total Joint Exercises Ankle Circles/Pumps: AROM;Both;10 reps;Supine Quad Sets: AROM;Right;Supine;10 reps Short Arc Quad: AROM;Right;10 reps;Supine Heel Slides: AAROM;Supine;Right;10 reps Hip ABduction/ADduction: AAROM;Right;10 reps;Supine Straight Leg Raises: AAROM;Right;10 reps;Supine Goniometric ROM: R knee flex 0-78 degrees flexion.      General Comments        Pertinent Vitals/Pain Pain Assessment: 0-10 Pain Score: 3  Pain Location: R knee with activity Pain Descriptors / Indicators: Aching;Grimacing Pain Intervention(s): Monitored during session;Repositioned;Ice applied    Home Living                      Prior Function            PT Goals (current goals can now be found in the care plan section) Acute Rehab  PT Goals Patient Stated Goal: Return home with decreased pain Potential to Achieve Goals: Good Progress towards PT goals: Progressing toward goals    Frequency    7X/week      PT Plan Current plan remains appropriate    Co-evaluation              AM-PAC PT "6 Clicks" Daily Activity  Outcome Measure  Difficulty turning over in bed (including adjusting bedclothes, sheets and blankets)?: Unable Difficulty moving from lying on back to sitting on the side of the bed? : Unable Difficulty sitting down on and standing up from a chair with arms (e.g., wheelchair,  bedside commode, etc,.)?: A Little Help needed moving to and from a bed to chair (including a wheelchair)?: A Little Help needed walking in hospital room?: A Little Help needed climbing 3-5 steps with a railing? : A Lot 6 Click Score: 13    End of Session Equipment Utilized During Treatment: Gait belt Activity Tolerance: Patient tolerated treatment well;Patient limited by pain Patient left: in bed;with call bell/phone within reach;with family/visitor present Nurse Communication: Mobility status PT Visit Diagnosis: Other abnormalities of gait and mobility (R26.89);Pain Pain - Right/Left: Right Pain - part of body: Knee     Time: 4315-4008 PT Time Calculation (min) (ACUTE ONLY): 37 min  Charges:  $Gait Training: 8-22 mins $Therapeutic Exercise: 8-22 mins                    G Codes:       Governor Rooks, PTA pager [phone]    Cristela Blue 06/30/2017, 2:58 PM

## 2017-06-30 NOTE — Evaluation (Signed)
Occupational Therapy Evaluation Patient Details Name: [name] MRN: [id number] DOB: 1960/11/02 Today's Date: 06/30/2017    History of Present Illness [name] a 56 y.o. female now s/p elective R TKA. Pertinent PMH includes pulmonary sarcoidosis on prednisone, DM, HTN, COPD, obesity, GERD, gout, depression.    Clinical Impression   Pt admitted as above currently demonstrating deficits in her ability to perform ADL and self care tasks (see OT problem list below). She should benefit from acute OT for pt/family education and ADL retraining with focus on tub/toilet transfers and LB ADL's prior to d/c home with family/friends PRN assist.    Follow Up Recommendations  No OT follow up;Supervision - Intermittent    Equipment Recommendations  3 in 1 bedside commode (Pt may have one at home (father who passed away), her husband is going to check at home.)    Recommendations for Other Services       Precautions / Restrictions Precautions Precautions: Knee Precaution Booklet Issued: No Precaution Comments: Verbally reviewed precautions Restrictions Weight Bearing Restrictions: Yes RLE Weight Bearing: Weight bearing as tolerated      Mobility Bed Mobility Overal bed mobility: Needs Assistance Bed Mobility: Supine to Sit     Supine to sit: Min assist;HOB elevated     General bed mobility comments: MinA to assist RLE out of/into bed. Pt also Min A this morning to scoot EOB secondary to increased pain R knee.  Transfers Overall transfer level: Needs assistance Equipment used: Rolling walker (2 wheeled) Transfers: Sit to/from Stand Sit to Stand: Min guard         General transfer comment: Stood with RW and min guard for balance; cues for technique and hand placement on RW as well as during transfer to 3:1 and then recliner chair.    Balance Overall balance assessment: Needs assistance Sitting-balance support: Feet supported;Single extremity supported Sitting balance-Leahy  Scale: Good     Standing balance support: Bilateral upper extremity supported;During functional activity Standing balance-Leahy Scale: Fair Standing balance comment: Reliant on BUE support                           ADL either performed or assessed with clinical judgement   ADL Overall ADL's : Needs assistance/impaired Eating/Feeding: Sitting;Independent   Grooming: Wash/dry hands;Wash/dry face;Set up;Sitting   Upper Body Bathing: Modified independent;Sitting   Lower Body Bathing: Minimal assistance;Sit to/from stand   Upper Body Dressing : Independent;Sitting   Lower Body Dressing: Minimal assistance;Sit to/from stand Lower Body Dressing Details (indicate cue type and reason): Pt will have family PRN to assist after d/c home  Toilet Transfer: Minimal assistance;BSC;RW;Ambulation   Toileting- Clothing Manipulation and Hygiene: Minimal assistance;Sit to/from stand;Cueing for safety;Cueing for sequencing Toileting - Clothing Manipulation Details (indicate cue type and reason): Cues for safety with RW while standing for hygiene   Tub/Shower Transfer Details (indicate cue type and reason): Pt has tub at home, has shower seat (was family member) Functional mobility during ADLs: Minimal assistance;Rolling walker;Cueing for safety;Cueing for sequencing (Min A initial sit to stand, cues for safety/sequencing w/ RW) General ADL Comments: Pt/family were educated in role of OT followed by assessment and participation in ADL retrainig session today. Pt has some DME at home and will have necessary assist PRN after d/c. Focus on toilet transfers, bed mobility, amb to recliner and grooming in sitting. Discussed home set-up w/ pt/spouse extensively to include pt may have 3:1 at home from her father (whom has passed  away), set-up of bathroom, home etc. Grab bars in bathroom near toilet and 1 at wall into tub.     Vision Patient Visual Report: No change from baseline       Perception      Praxis      Pertinent Vitals/Pain Pain Assessment: 0-10 Pain Score: 10-Worst pain ever Pain Location: R knee with activity Pain Descriptors / Indicators: Aching;Grimacing Pain Intervention(s): Limited activity within patient's tolerance;Monitored during session;Repositioned;RN gave pain meds during session;Other (comment) (RN was aware and gave meds via IV after transfers were completed)     Hand Dominance Right   Extremity/Trunk Assessment Upper Extremity Assessment Upper Extremity Assessment: Overall WFL for tasks assessed   Lower Extremity Assessment Lower Extremity Assessment: Defer to PT evaluation       Communication Communication Communication: No difficulties   Cognition Arousal/Alertness: Awake/alert Behavior During Therapy: WFL for tasks assessed/performed Overall Cognitive Status: Within Functional Limits for tasks assessed                                     General Comments       Exercises     Shoulder Instructions      Home Living Family/patient expects to be discharged to:: Private residence Living Arrangements: Spouse/significant other Available Help at Discharge: Family;Available 24 hours/day Type of Home: House Home Access: Stairs to enter [employer] of Steps: 2 Entrance Stairs-Rails: Right Home Layout: One level     Bathroom Shower/Tub: Tub/shower unit;Curtain   Biochemist, clinical: Standard     Home Equipment: Environmental consultant - 2 wheels;Bedside commode   Additional Comments: Husband to be home first few days; Sister to stay with her next week and a friend after that per Spouse report      Prior Functioning/Environment Level of Independence: Independent                 OT Problem List: Pain;Decreased activity tolerance;Decreased knowledge of use of DME or AE;Impaired balance (sitting and/or standing)      OT Treatment/Interventions: Self-care/ADL training;DME and/or AE instruction;Therapeutic  activities;Patient/family education    OT Goals(Current goals can be found in the care plan section) Acute Rehab OT Goals Patient Stated Goal: Return home with decreased pain OT Goal Formulation: With patient Time For Goal Achievement: 07/14/17 Potential to Achieve Goals: Good  OT Frequency: Min 2X/week   Barriers to D/C:            Co-evaluation              AM-PAC PT "6 Clicks" Daily Activity     Outcome Measure Help from another person eating meals?: None Help from another person taking care of personal grooming?: A Little Help from another person toileting, which includes using toliet, bedpan, or urinal?: A Little Help from another person bathing (including washing, rinsing, drying)?: A Little Help from another person to put on and taking off regular upper body clothing?: None Help from another person to put on and taking off regular lower body clothing?: A Little 6 Click Score: 20   End of Session Equipment Utilized During Treatment: Gait belt;Rolling walker Nurse Communication: Other (comment) (RN gave pain meds after pt transferred to recliner)  Activity Tolerance: Patient tolerated treatment well Patient left: in chair;with call bell/phone within reach;with family/visitor present;with nursing/sitter in room  OT Visit Diagnosis: Unsteadiness on feet (R26.81);Pain Pain - Right/Left: Right Pain - part of body: Knee  Time: 1146-4314 OT Time Calculation (min): 34 min Charges:  OT General Charges $OT Visit: 1 Visit OT Evaluation $OT Eval Low Complexity: 1 Low OT Treatments $Self Care/Home Management : 23-37 mins G-Codes:      Almyra Deforest, OTR/L 06/30/2017, 9:25 AM

## 2017-07-01 ENCOUNTER — Encounter (HOSPITAL_COMMUNITY): Payer: Self-pay | Admitting: Orthopedic Surgery

## 2017-07-01 ENCOUNTER — Other Ambulatory Visit (INDEPENDENT_AMBULATORY_CARE_PROVIDER_SITE_OTHER): Payer: Self-pay | Admitting: Orthopedic Surgery

## 2017-07-01 DIAGNOSIS — M1711 Unilateral primary osteoarthritis, right knee: Secondary | ICD-10-CM

## 2017-07-01 MED ORDER — OXYCODONE-ACETAMINOPHEN 5-325 MG PO TABS: 1.0000 | ORAL_TABLET | ORAL | 0 refills | Status: DC | PRN

## 2017-07-01 NOTE — Discharge Summary (Signed)
Discharge Diagnoses:  Active Problems:   Osteoarthrosis, localized, primary, knee, right   Total knee replacement status, right   Surgeries: Procedure(s): RIGHT TOTAL KNEE ARTHROPLASTY on 06/29/2017    Consultants:   Discharged Condition: Improved  Hospital Course: [name] is an 56 y.o. female who was admitted 06/29/2017 with a chief complaint of osteoarthritis right knee, with a final diagnosis of Osteoarthritis Right Knee.  Patient was brought to the operating room on 06/29/2017 and underwent Procedure(s): RIGHT TOTAL KNEE ARTHROPLASTY.    Patient was given perioperative antibiotics:  Anti-infectives    Start     Dose/Rate Route Frequency Ordered Stop   06/29/17 1515  ceFAZolin (ANCEF) IVPB 2g/100 mL premix     2 g 200 mL/hr over 30 Minutes Intravenous Every 6 hours 06/29/17 1500 06/29/17 1539   06/29/17 0715  ceFAZolin (ANCEF) 3 g in dextrose 5 % 50 mL IVPB     3 g 160 mL/hr over 30 Minutes Intravenous To [facility]Stay [facility] 06/29/17 0706 06/29/17 0845   06/29/17 0630  aztreonam (AZACTAM) 1 g in dextrose 5 % 50 mL IVPB  Status:  Discontinued     1 g 100 mL/hr over 30 Minutes Intravenous  Once 06/29/17 0628 06/29/17 0705    .  Patient was given sequential compression devices, early ambulation, and aspirin for DVT prophylaxis.  Recent vital signs:  Patient Vitals for the past 24 hrs:  BP Temp Temp src Pulse Resp SpO2  07/01/17 0653 (!) 126/59 99.5 F (37.5 C) Oral 94 - 92 %  07/01/17 0245 - - - - - 94 %  06/30/17 2017 131/61 98.3 F (36.8 C) Oral 75 18 97 %  06/30/17 1501 (!) 100/48 98.7 F (37.1 C) Oral 77 18 94 %  06/30/17 0800 (!) 171/65 - - 95 - 93 %  .  Recent laboratory studies: No results found.  Discharge Medications:   Allergies as of 07/01/2017      Reactions   Methotrexate Other (See Comments)   Peri-oral and buccal lesions   Vancomycin Other (See Comments)   Nephrotoxicity   Clindamycin/lincomycin Nausea And Vomiting, Rash   Chlorhexidine  Itching   Doxycycline Rash   Lisinopril Cough   Teflaro [ceftaroline] Rash      Medication List    TAKE these medications   albuterol (2.5 MG/3ML) 0.083% nebulizer solution Commonly known as:  PROVENTIL Take 2.5 mg by nebulization every 6 (six) hours as needed for wheezing or shortness of breath.   albuterol 108 (90 Base) MCG/ACT inhaler Commonly known as:  PROVENTIL HFA;VENTOLIN HFA Inhale 1-2 puffs into the lungs every 6 (six) hours as needed for wheezing or shortness of breath.   allopurinol 100 MG tablet Commonly known as:  ZYLOPRIM Take 100 mg by mouth at bedtime.   aspirin EC 81 MG tablet Take 81 mg by mouth daily.   atorvastatin 40 MG tablet Commonly known as:  LIPITOR TAKE 1 TAB BY MOUTH ONCE DAILY What changed:  See the new instructions.   BAYER CONTOUR MONITOR w/Device Kit Use daily to check sugar   BAYER MICROLET LANCETS lancets Use as instructed to check sugar 3 times daily   benzonatate 100 MG capsule Commonly known as:  TESSALON Take 100 mg by mouth 3 (three) times daily as needed for cough. Reported on 01/05/2016   BRISDELLE 7.5 MG Caps Generic drug:  PARoxetine Mesylate Take 7.5 mg by mouth daily.   buPROPion 300 MG 24 hr tablet Commonly known as:  WELLBUTRIN XL TAKE  1 TABLET (300 MG TOTAL) BY MOUTH DAILY.   carvedilol 25 MG tablet Commonly known as:  COREG Take 1 tablet (25 mg total) by mouth 2 (two) times daily with a meal.   DEXILANT 60 MG capsule Generic drug:  dexlansoprazole TAKE 1 CAPSULE BY MOUTH DAILY What changed:  See the new instructions.   diclofenac sodium 1 % Gel Commonly known as:  VOLTAREN APPLY 2 GRAMS TOPICALLY 4 TIMES A DAY What changed:  See the new instructions.   diltiazem 120 MG 24 hr capsule Commonly known as:  CARDIZEM CD Take 1 capsule (120 mg total) by mouth daily.   estradiol 2 MG tablet Commonly known as:  ESTRACE Take 2 mg by mouth at bedtime.   fenofibrate 145 MG tablet Commonly known as:   TRICOR TAKE 1 TABLET (145 MG TOTAL) BY MOUTH DAILY.   fluconazole 150 MG tablet Commonly known as:  DIFLUCAN Take 1 tablet (150 mg total) by mouth daily.   fluticasone 50 MCG/ACT nasal spray Commonly known as:  FLONASE Place 2 sprays into both nostrils daily.   furosemide 40 MG tablet Commonly known as:  LASIX TAKE 1 TABLET BY MOUTH TWICE A DAY What changed:  See the new instructions.   gabapentin 100 MG capsule Commonly known as:  NEURONTIN TAKE 1 CAPSULE BY MOUTH THREE TIMES A DAY   glucose blood test strip Commonly known as:  BAYER CONTOUR TEST Use as instructed to check sugar 3 times daily   insulin aspart 100 UNIT/ML FlexPen Commonly known as:  NOVOLOG FLEXPEN Inject 22-27 Units into the skin 3 (three) times daily with meals.   Insulin Glargine 300 UNIT/ML Sopn Commonly known as:  TOUJEO MAX SOLOSTAR Inject 45 Units into the skin at bedtime.   Insulin Pen Needle 33G X 4 MM Misc 1 each by Does not apply route 4 (four) times daily.   LORazepam 0.5 MG tablet Commonly known as:  ATIVAN Take 0.5 mg by mouth every 12 (twelve) hours as needed for anxiety.   losartan 50 MG tablet Commonly known as:  COZAAR TAKE 1 TABLET (50 MG TOTAL) BY MOUTH DAILY.   metFORMIN 1000 MG tablet Commonly known as:  GLUCOPHAGE Take 1 tablet (1,000 mg total) by mouth 2 (two) times daily with a meal.   mometasone-formoterol 200-5 MCG/ACT Aero Commonly known as:  DULERA Inhale 2 puffs into the lungs 2 (two) times daily.   nitroGLYCERIN 0.4 MG SL tablet Commonly known as:  NITROSTAT Place 1 tablet (0.4 mg total) under the tongue every 5 (five) minutes as needed for chest pain. Reported on 01/05/2016   ondansetron 4 MG disintegrating tablet Commonly known as:  ZOFRAN-ODT Take 4 mg by mouth every 8 (eight) hours as needed for nausea or vomiting. Reported on 01/05/2016   orlistat 120 MG capsule Commonly known as:  XENICAL Take 1 capsule (120 mg total) by mouth 3 (three) times daily with  meals.   oxyCODONE-acetaminophen 5-325 MG tablet Commonly known as:  ROXICET Take 1 tablet by mouth every 4 (four) hours as needed for severe pain.   Potassium Chloride ER 20 MEQ Tbcr Take 1 tablet by mouth 3 (three) times daily.   predniSONE 5 MG tablet Commonly known as:  DELTASONE Take 5 mg by mouth daily with breakfast.   traMADol 50 MG tablet Commonly known as:  ULTRAM Take 1 tablet (50 mg total) by mouth every 12 (twelve) hours as needed for moderate pain or severe pain.   venlafaxine XR 75 MG 24  hr capsule Commonly known as:  EFFEXOR-XR Take 2 capsules (150 mg total) by mouth at bedtime.   vitamin B-12 1000 MCG tablet Commonly known as:  CYANOCOBALAMIN Take 1,000 mcg by mouth daily.            Discharge Care Instructions        Start     Ordered   07/01/17 0000  Call MD / Call 911    Comments:  If you experience chest pain or shortness of breath, CALL 911 and be transported to the hospital emergency room.  If you develope a fever above 101 F, pus (white drainage) or increased drainage or redness at the wound, or calf pain, call your surgeon's office.   07/01/17 0733   07/01/17 0000  Diet - low sodium heart healthy     07/01/17 0733   07/01/17 0000  Constipation Prevention    Comments:  Drink plenty of fluids.  Prune juice may be helpful.  You may use a stool softener, such as Colace (over the counter) 100 mg twice a day.  Use MiraLax (over the counter) for constipation as needed.   07/01/17 0733   07/01/17 0000  Increase activity slowly as tolerated     07/01/17 0733   07/01/17 0000  oxyCODONE-acetaminophen (ROXICET) 5-325 MG tablet  Every 4 hours PRN     07/01/17 0733   07/01/17 0000  Elevate operative extremity     07/01/17 0733      Diagnostic Studies: US Breast Ltd Uni Right Inc Axilla  Result Date: 06/27/2017 CLINICAL DATA:  Lump in the superior right breast. The patient noticed it within the past week. She denies any known recent major or minor  trauma. She denies any bruising to the skin of the right breast. EXAM: 2D DIGITAL DIAGNOSTIC RIGHT MAMMOGRAM WITH CAD AND ADJUNCT TOMO ULTRASOUND RIGHT BREAST COMPARISON:  Previous exam(s). ACR Breast Density Category b: There are scattered areas of fibroglandular density. FINDINGS: Metallic skin marker was placed on the site of palpable concern in the superior right breast. On a spot tangential view, a mixed density fat contain mass is seen immediately subjacent to the skin. The remainder of the right breast is negative. Mammographic images were processed with CAD. On physical exam, there is a superficial smooth approximately 1.5 cm palpable lump in the 11:30 position of the right breast approximately 11-12 cm from the nipple. The overlying skin is normal in color and is intact. Targeted ultrasound is performed, showing a mixed echogenicity mass immediately subjacent to the skin that measures approximately 1.6 x 1.7 x 0.6 cm. This mass includes cystic components, as well as areas of increased echogenicity within the subcutaneous fat. Findings suggest benign fat necrosis. IMPRESSION: Probable benign fat necrosis in the right breast, accounting for the palpable area of concern. RECOMMENDATION: Bilateral diagnostic mammogram and right breast ultrasound is recommended in February 2019. I have discussed the findings and recommendations with the patient. Results were also provided in writing at the conclusion of the visit. If applicable, a reminder letter will be sent to the patient regarding the next appointment. BI-RADS CATEGORY  3: Probably benign. Electronically Signed   By: Curlene Dolphin M.D.   On: 06/27/2017 13:52   Mm Diag Breast Tomo Uni Right  Result Date: 06/27/2017 CLINICAL DATA:  Lump in the superior right breast. The patient noticed it within the past week. She denies any known recent major or minor trauma. She denies any bruising to the skin of the right breast.  EXAM: 2D DIGITAL DIAGNOSTIC RIGHT  MAMMOGRAM WITH CAD AND ADJUNCT TOMO ULTRASOUND RIGHT BREAST COMPARISON:  Previous exam(s). ACR Breast Density Category b: There are scattered areas of fibroglandular density. FINDINGS: Metallic skin marker was placed on the site of palpable concern in the superior right breast. On a spot tangential view, a mixed density fat contain mass is seen immediately subjacent to the skin. The remainder of the right breast is negative. Mammographic images were processed with CAD. On physical exam, there is a superficial smooth approximately 1.5 cm palpable lump in the 11:30 position of the right breast approximately 11-12 cm from the nipple. The overlying skin is normal in color and is intact. Targeted ultrasound is performed, showing a mixed echogenicity mass immediately subjacent to the skin that measures approximately 1.6 x 1.7 x 0.6 cm. This mass includes cystic components, as well as areas of increased echogenicity within the subcutaneous fat. Findings suggest benign fat necrosis. IMPRESSION: Probable benign fat necrosis in the right breast, accounting for the palpable area of concern. RECOMMENDATION: Bilateral diagnostic mammogram and right breast ultrasound is recommended in February 2019. I have discussed the findings and recommendations with the patient. Results were also provided in writing at the conclusion of the visit. If applicable, a reminder letter will be sent to the patient regarding the next appointment. BI-RADS CATEGORY  3: Probably benign. Electronically Signed   By: Curlene Dolphin M.D.   On: 06/27/2017 13:52    Patient benefited maximally from their hospital stay and there were no complications.     Disposition: 01-Home or Self Care Discharge Instructions    Call MD / Call 911    Complete by:  As directed    If you experience chest pain or shortness of breath, CALL 911 and be transported to the hospital emergency room.  If you develope a fever above 101 F, pus (white drainage) or increased  drainage or redness at the wound, or calf pain, call your surgeon's office.   Constipation Prevention    Complete by:  As directed    Drink plenty of fluids.  Prune juice may be helpful.  You may use a stool softener, such as Colace (over the counter) 100 mg twice a day.  Use MiraLax (over the counter) for constipation as needed.   Diet - low sodium heart healthy    Complete by:  As directed    Elevate operative extremity    Complete by:  As directed    Increase activity slowly as tolerated    Complete by:  As directed      Follow-up Information    Newt Minion, MD Follow up in 1 week(s).   Specialty:  Orthopedic Surgery Contact information: [address] [name] [address] [phone]            Signed: Newt Minion 07/01/2017, 7:34 AM

## 2017-07-01 NOTE — Care Management Note (Signed)
Case Management Note  Patient Details  Name: [name] MRN: [id number] Date of Birth: 1961-01-23  Subjective/Objective:    56 yr old female s/p right total knee arthroplasty.                Action/Plan: Case manager spoke with patient and her husband concerning discharge plan and DME. Choice for Home Health Agency was offered. Referral was called to Melene Muller, [facility] liaison. Patient has RW and 3in1 at home, will have family support at discharge.    Expected Discharge Date:  07/01/17               Expected Discharge Plan:  [place]  In-House Referral:  NA  Discharge planning Services  CM Consult  Post Acute Care Choice:  Home Health Choice offered to:  Patient, Spouse  DME Arranged:   (Has RW and 3in1) DME Agency:  NA  HH Arranged:  PT HH Agency:  [facility]  Status of Service:  Completed, signed off  If discussed at [place] of Stay Meetings, dates discussed:    Additional Comments:  Ninfa Meeker, RN 07/01/2017, 10:13 AM

## 2017-07-01 NOTE — Progress Notes (Signed)
Occupational Therapy Treatment Patient Details Name: [name] MRN: [id number] DOB: 1961/06/07 Today's Date: 07/01/2017    History of present illness [name] a 56 y.o. female now s/p elective R TKA. Pertinent PMH includes pulmonary sarcoidosis on prednisone, DM, HTN, COPD, obesity, GERD, gout, depression.    OT comments  Pt progressing towards goals. Reviewed and educated Pt/caregiver on tub transfers to 3:1 using RW with Pt/caregiver verbalizing understanding. Pt completed functional mobility at RW level with MinGuard throughout session. Educated on safety and compensatory techniques for completing ADLs after return home. Questions answered throughout. Feel Pt will safely progress home with caregiver assist for ADL completion PRN. Will continue to follow acutely.    Follow Up Recommendations  No OT follow up;Supervision - Intermittent    Equipment Recommendations  3 in 1 bedside commode          Precautions / Restrictions Precautions Precautions: Knee Precaution Booklet Issued: No Precaution Comments: Verbally reviewed precautions Restrictions Weight Bearing Restrictions: Yes RLE Weight Bearing: Weight bearing as tolerated       Mobility Bed Mobility Overal bed mobility: Needs Assistance Bed Mobility: Supine to Sit     Supine to sit: Min assist;HOB elevated     General bed mobility comments: OOB in therapy gym with PTA start of session   Transfers Overall transfer level: Needs assistance Equipment used: Rolling walker (2 wheeled) Transfers: Sit to/from Stand Sit to Stand: Min guard         General transfer comment: cues for hand placement to/from seated surface, increased time to rise and stabilize     Balance Overall balance assessment: Needs assistance   Sitting balance-Leahy Scale: Good       Standing balance-Leahy Scale: Fair                             ADL either performed or assessed with clinical judgement   ADL Overall ADL's :  Needs assistance/impaired                       Lower Body Dressing Details (indicate cue type and reason): educated on compensatory techniques for completing task          Tub/ Shower Transfer: Tub transfer;Ambulation;3 in 1;Shower Technical sales engineer Details (indicate cue type and reason): reviewed and demonstrated correct sequence/technique for completing tub transfer to 3:1 with Pt and Pt caregiver; Pt attempted to return demonstrate, though reports feeling unable to safely step into tub at this time due to pain in R knee; educated Pt to complete sponge bath until she feels she is safely able to step into tub, and to complete transfer initially with supervision with Pt/caregiver verbalizing understanding Functional mobility during ADLs: Min guard;Rolling walker                         Cognition Arousal/Alertness: Awake/alert;Lethargic Behavior During Therapy: WFL for tasks assessed/performed Overall Cognitive Status: Within Functional Limits for tasks assessed                                 General Comments: Pt intermittently falling asleep end of session when seated in recliner                          Pertinent Vitals/ Pain  Pain Assessment: Faces Pain Score: 8  Faces Pain Scale: Hurts even more Pain Location: R knee with activity Pain Descriptors / Indicators: Aching;Grimacing Pain Intervention(s): Monitored during session;Repositioned;Limited activity within patient's tolerance                                                          Frequency  Min 2X/week        Progress Toward Goals  OT Goals(current goals can now be found in the care plan section)  Progress towards OT goals: Progressing toward goals  Acute Rehab OT Goals Patient Stated Goal: Return home with decreased pain OT Goal Formulation: With patient Time For Goal Achievement: 07/14/17 Potential to Achieve  Goals: Good  Plan Discharge plan remains appropriate                    AM-PAC PT "6 Clicks" Daily Activity     Outcome Measure   Help from another person eating meals?: None Help from another person taking care of personal grooming?: A Little Help from another person toileting, which includes using toliet, bedpan, or urinal?: A Little Help from another person bathing (including washing, rinsing, drying)?: A Little Help from another person to put on and taking off regular upper body clothing?: None Help from another person to put on and taking off regular lower body clothing?: A Little 6 Click Score: 20    End of Session Equipment Utilized During Treatment: Gait belt;Rolling walker  OT Visit Diagnosis: Unsteadiness on feet (R26.81);Pain Pain - Right/Left: Right Pain - part of body: Knee   Activity Tolerance Patient tolerated treatment well   Patient Left in chair;with call bell/phone within reach;with family/visitor present   Nurse Communication Mobility status        Time: 1206-1227 OT Time Calculation (min): 21 min  Charges: OT General Charges $OT Visit: 1 Visit OT Treatments $Self Care/Home Management : 8-22 mins  Lou Cal, OT Pager [phone] 07/01/2017    Raymondo Band 07/01/2017, 1:54 PM

## 2017-07-01 NOTE — Progress Notes (Signed)
Physical Therapy Treatment Patient Details Name: [name] MRN: [id number] DOB: 1960/10/18 Today's Date: 07/01/2017    History of Present Illness [name] a 56 y.o. female now s/p elective R TKA. Pertinent PMH includes pulmonary sarcoidosis on prednisone, DM, HTN, COPD, obesity, GERD, gout, depression.     PT Comments    Pt performed gait and reviewed stair training in prep for d/c home.  Pt remains to fatigue quickly and requires increased time and encouragement to complete activities.  Husband present for stair training.  Pt in OT gym in recliner with OT post session as OT dovetailed PTA tx.      Follow Up Recommendations  DC plan and follow up therapy as arranged by surgeon;Home health PT     Equipment Recommendations  Rolling walker with 5" wheels    Recommendations for Other Services OT consult     Precautions / Restrictions Precautions Precautions: Knee Precaution Booklet Issued: No Precaution Comments: Verbally reviewed precautions Restrictions Weight Bearing Restrictions: Yes RLE Weight Bearing: Weight bearing as tolerated    Mobility  Bed Mobility Overal bed mobility: Needs Assistance Bed Mobility: Supine to Sit     Supine to sit: Min assist;HOB elevated     General bed mobility comments: Pt required min assist to lift RLE to edge of bed and out of bed, assist to elevate trunk into sitting.  Pt required increased time due to pain.  Increased time to scoot forward.    Transfers Overall transfer level: Needs assistance Equipment used: Rolling walker (2 wheeled) Transfers: Sit to/from Stand Sit to Stand: Min assist;Min guard         General transfer comment: Cues for hand placement to and from seated surface.  Heavy min assist from edge of bed, min guard from recliner.  Pt required decreased assistance from recliner.    Ambulation/Gait Ambulation/Gait assistance: Min guard Ambulation Distance (Feet): 80 Feet Assistive device: Rolling walker (2  wheeled) Gait Pattern/deviations: Step-to pattern;Decreased stride length;Antalgic;Trunk flexed;Shuffle Gait velocity: Decreased Gait velocity interpretation: Below normal speed for age/gender General Gait Details: Pt remains to require cues for upper trunk control, RW position and safety.  Pt with increased WOB and wanting to sit after 30 ft requiring encouragement to advance gait distance.  Pt remains to require frequent rest breaks due to pain and fatigue.     Stairs Stairs: Yes  min assistance Stair Management: One rail Left;Forwards (HHA on R.  ) Number of Stairs: 4 General stair comments: Pt performed 4 stairs with cues for sequencing and hand placement on railing.  Pt slow and guarded with decreased hip flexion on L side.    Wheelchair Mobility    Modified Rankin (Stroke Patients Only)       Balance Overall balance assessment: Needs assistance   Sitting balance-Leahy Scale: Good       Standing balance-Leahy Scale: Fair                              Cognition Arousal/Alertness: Awake/alert Behavior During Therapy: WFL for tasks assessed/performed Overall Cognitive Status: Within Functional Limits for tasks assessed                                        Exercises Total Joint Exercises Ankle Circles/Pumps: AROM;Both;10 reps;Supine Quad Sets: AROM;Right;Supine;10 reps Goniometric ROM: grossly 75 degrees flexion.  General Comments        Pertinent Vitals/Pain Pain Assessment: 0-10 Pain Score: 8  Pain Location: R knee with activity Pain Descriptors / Indicators: Aching;Grimacing Pain Intervention(s): Monitored during session;Repositioned    Home Living                      Prior Function            PT Goals (current goals can now be found in the care plan section) Acute Rehab PT Goals Patient Stated Goal: Return home with decreased pain Potential to Achieve Goals: Good Progress towards PT goals: Progressing  toward goals    Frequency    7X/week      PT Plan Current plan remains appropriate    Co-evaluation              AM-PAC PT "6 Clicks" Daily Activity  Outcome Measure  Difficulty turning over in bed (including adjusting bedclothes, sheets and blankets)?: Unable Difficulty moving from lying on back to sitting on the side of the bed? : Unable Difficulty sitting down on and standing up from a chair with arms (e.g., wheelchair, bedside commode, etc,.)?: Unable Help needed moving to and from a bed to chair (including a wheelchair)?: A Little Help needed walking in hospital room?: A Little Help needed climbing 3-5 steps with a railing? : A Little 6 Click Score: 12    End of Session Equipment Utilized During Treatment: Gait belt Activity Tolerance: Patient tolerated treatment well;Patient limited by pain Patient left: with family/visitor present (Pt left in gym with OT post session.  ) Nurse Communication: Mobility status PT Visit Diagnosis: Other abnormalities of gait and mobility (R26.89);Pain Pain - Right/Left: Right Pain - part of body: Knee     Time: 1137-1210 PT Time Calculation (min) (ACUTE ONLY): 33 min  Charges:  $Gait Training: 8-22 mins $Therapeutic Exercise: 8-22 mins                    G Codes:       Governor Rooks, PTA pager [phone]    Cristela Blue 07/01/2017, 12:35 PM

## 2017-07-01 NOTE — Discharge Instructions (Signed)

## 2017-07-02 DIAGNOSIS — J449 Chronic obstructive pulmonary disease, unspecified: Secondary | ICD-10-CM | POA: Diagnosis not present

## 2017-07-02 DIAGNOSIS — Z471 Aftercare following joint replacement surgery: Secondary | ICD-10-CM | POA: Diagnosis not present

## 2017-07-02 DIAGNOSIS — I5032 Chronic diastolic (congestive) heart failure: Secondary | ICD-10-CM | POA: Diagnosis not present

## 2017-07-02 DIAGNOSIS — M109 Gout, unspecified: Secondary | ICD-10-CM | POA: Diagnosis not present

## 2017-07-02 DIAGNOSIS — G4733 Obstructive sleep apnea (adult) (pediatric): Secondary | ICD-10-CM | POA: Diagnosis not present

## 2017-07-02 DIAGNOSIS — F329 Major depressive disorder, single episode, unspecified: Secondary | ICD-10-CM | POA: Diagnosis not present

## 2017-07-02 DIAGNOSIS — E1122 Type 2 diabetes mellitus with diabetic chronic kidney disease: Secondary | ICD-10-CM | POA: Diagnosis not present

## 2017-07-02 DIAGNOSIS — D860 Sarcoidosis of lung: Secondary | ICD-10-CM | POA: Diagnosis not present

## 2017-07-02 DIAGNOSIS — I130 Hypertensive heart and chronic kidney disease with heart failure and stage 1 through stage 4 chronic kidney disease, or unspecified chronic kidney disease: Secondary | ICD-10-CM | POA: Diagnosis not present

## 2017-07-02 DIAGNOSIS — N183 Chronic kidney disease, stage 3 (moderate): Secondary | ICD-10-CM | POA: Diagnosis not present

## 2017-07-02 DIAGNOSIS — K219 Gastro-esophageal reflux disease without esophagitis: Secondary | ICD-10-CM | POA: Diagnosis not present

## 2017-07-02 DIAGNOSIS — D631 Anemia in chronic kidney disease: Secondary | ICD-10-CM | POA: Diagnosis not present

## 2017-07-04 DIAGNOSIS — G4733 Obstructive sleep apnea (adult) (pediatric): Secondary | ICD-10-CM | POA: Diagnosis not present

## 2017-07-04 DIAGNOSIS — M109 Gout, unspecified: Secondary | ICD-10-CM | POA: Diagnosis not present

## 2017-07-04 DIAGNOSIS — F329 Major depressive disorder, single episode, unspecified: Secondary | ICD-10-CM | POA: Diagnosis not present

## 2017-07-04 DIAGNOSIS — D631 Anemia in chronic kidney disease: Secondary | ICD-10-CM | POA: Diagnosis not present

## 2017-07-04 DIAGNOSIS — N183 Chronic kidney disease, stage 3 (moderate): Secondary | ICD-10-CM | POA: Diagnosis not present

## 2017-07-04 DIAGNOSIS — J449 Chronic obstructive pulmonary disease, unspecified: Secondary | ICD-10-CM | POA: Diagnosis not present

## 2017-07-04 DIAGNOSIS — D860 Sarcoidosis of lung: Secondary | ICD-10-CM | POA: Diagnosis not present

## 2017-07-04 DIAGNOSIS — Z471 Aftercare following joint replacement surgery: Secondary | ICD-10-CM | POA: Diagnosis not present

## 2017-07-04 DIAGNOSIS — K219 Gastro-esophageal reflux disease without esophagitis: Secondary | ICD-10-CM | POA: Diagnosis not present

## 2017-07-04 DIAGNOSIS — E1122 Type 2 diabetes mellitus with diabetic chronic kidney disease: Secondary | ICD-10-CM | POA: Diagnosis not present

## 2017-07-04 DIAGNOSIS — I5032 Chronic diastolic (congestive) heart failure: Secondary | ICD-10-CM | POA: Diagnosis not present

## 2017-07-04 DIAGNOSIS — I130 Hypertensive heart and chronic kidney disease with heart failure and stage 1 through stage 4 chronic kidney disease, or unspecified chronic kidney disease: Secondary | ICD-10-CM | POA: Diagnosis not present

## 2017-07-05 DIAGNOSIS — M1991 Primary osteoarthritis, unspecified site: Secondary | ICD-10-CM | POA: Diagnosis not present

## 2017-07-06 ENCOUNTER — Telehealth (INDEPENDENT_AMBULATORY_CARE_PROVIDER_SITE_OTHER): Payer: Self-pay | Admitting: Orthopedic Surgery

## 2017-07-06 DIAGNOSIS — N183 Chronic kidney disease, stage 3 (moderate): Secondary | ICD-10-CM | POA: Diagnosis not present

## 2017-07-06 DIAGNOSIS — E1122 Type 2 diabetes mellitus with diabetic chronic kidney disease: Secondary | ICD-10-CM | POA: Diagnosis not present

## 2017-07-06 DIAGNOSIS — I130 Hypertensive heart and chronic kidney disease with heart failure and stage 1 through stage 4 chronic kidney disease, or unspecified chronic kidney disease: Secondary | ICD-10-CM | POA: Diagnosis not present

## 2017-07-06 DIAGNOSIS — J449 Chronic obstructive pulmonary disease, unspecified: Secondary | ICD-10-CM | POA: Diagnosis not present

## 2017-07-06 DIAGNOSIS — F329 Major depressive disorder, single episode, unspecified: Secondary | ICD-10-CM | POA: Diagnosis not present

## 2017-07-06 DIAGNOSIS — Z471 Aftercare following joint replacement surgery: Secondary | ICD-10-CM | POA: Diagnosis not present

## 2017-07-06 DIAGNOSIS — D860 Sarcoidosis of lung: Secondary | ICD-10-CM | POA: Diagnosis not present

## 2017-07-06 DIAGNOSIS — K219 Gastro-esophageal reflux disease without esophagitis: Secondary | ICD-10-CM | POA: Diagnosis not present

## 2017-07-06 DIAGNOSIS — M109 Gout, unspecified: Secondary | ICD-10-CM | POA: Diagnosis not present

## 2017-07-06 DIAGNOSIS — D631 Anemia in chronic kidney disease: Secondary | ICD-10-CM | POA: Diagnosis not present

## 2017-07-06 DIAGNOSIS — I5032 Chronic diastolic (congestive) heart failure: Secondary | ICD-10-CM | POA: Diagnosis not present

## 2017-07-06 DIAGNOSIS — G4733 Obstructive sleep apnea (adult) (pediatric): Secondary | ICD-10-CM | POA: Diagnosis not present

## 2017-07-06 NOTE — Telephone Encounter (Signed)
Imodium should be sufficient

## 2017-07-06 NOTE — Telephone Encounter (Signed)
[facility] called saying that the patient is doing really well after surgery. The only problem is she's had diarrhea and she's been taking imodium AD. CB #  [phone]

## 2017-07-07 ENCOUNTER — Telehealth (INDEPENDENT_AMBULATORY_CARE_PROVIDER_SITE_OTHER): Payer: Self-pay | Admitting: Radiology

## 2017-07-07 NOTE — Telephone Encounter (Signed)
Patient called again today triage, calling about diarrhea not getting any better. Advised other than staying hydrated.There was nothing else OTC that could be taken per [facility]. I left vm for home health PT Jerilynn to ask if they could collect a stool sample to test for CDiff per Erin. Advised patient and [facility]ile Hearing if home health is unable to perform she needs to follow up with PCP.

## 2017-07-07 NOTE — Telephone Encounter (Signed)
I called and spoke with patient advised home health Cecile Hearing maybe with another patient. But for her to go ahead and follow up with PCP about this tomorrow.

## 2017-07-08 DIAGNOSIS — N183 Chronic kidney disease, stage 3 (moderate): Secondary | ICD-10-CM | POA: Diagnosis not present

## 2017-07-08 DIAGNOSIS — E1122 Type 2 diabetes mellitus with diabetic chronic kidney disease: Secondary | ICD-10-CM | POA: Diagnosis not present

## 2017-07-08 DIAGNOSIS — D860 Sarcoidosis of lung: Secondary | ICD-10-CM | POA: Diagnosis not present

## 2017-07-08 DIAGNOSIS — G4733 Obstructive sleep apnea (adult) (pediatric): Secondary | ICD-10-CM | POA: Diagnosis not present

## 2017-07-08 DIAGNOSIS — M109 Gout, unspecified: Secondary | ICD-10-CM | POA: Diagnosis not present

## 2017-07-08 DIAGNOSIS — J449 Chronic obstructive pulmonary disease, unspecified: Secondary | ICD-10-CM | POA: Diagnosis not present

## 2017-07-08 DIAGNOSIS — F329 Major depressive disorder, single episode, unspecified: Secondary | ICD-10-CM | POA: Diagnosis not present

## 2017-07-08 DIAGNOSIS — Z471 Aftercare following joint replacement surgery: Secondary | ICD-10-CM | POA: Diagnosis not present

## 2017-07-08 DIAGNOSIS — K219 Gastro-esophageal reflux disease without esophagitis: Secondary | ICD-10-CM | POA: Diagnosis not present

## 2017-07-08 DIAGNOSIS — I130 Hypertensive heart and chronic kidney disease with heart failure and stage 1 through stage 4 chronic kidney disease, or unspecified chronic kidney disease: Secondary | ICD-10-CM | POA: Diagnosis not present

## 2017-07-08 DIAGNOSIS — D631 Anemia in chronic kidney disease: Secondary | ICD-10-CM | POA: Diagnosis not present

## 2017-07-08 DIAGNOSIS — I5032 Chronic diastolic (congestive) heart failure: Secondary | ICD-10-CM | POA: Diagnosis not present

## 2017-07-10 ENCOUNTER — Other Ambulatory Visit: Payer: Self-pay | Admitting: Family Medicine

## 2017-07-11 ENCOUNTER — Ambulatory Visit (INDEPENDENT_AMBULATORY_CARE_PROVIDER_SITE_OTHER): Payer: BLUE CROSS/BLUE SHIELD

## 2017-07-11 ENCOUNTER — Ambulatory Visit (INDEPENDENT_AMBULATORY_CARE_PROVIDER_SITE_OTHER): Payer: BLUE CROSS/BLUE SHIELD | Admitting: Orthopedic Surgery

## 2017-07-11 DIAGNOSIS — J449 Chronic obstructive pulmonary disease, unspecified: Secondary | ICD-10-CM | POA: Diagnosis not present

## 2017-07-11 DIAGNOSIS — M109 Gout, unspecified: Secondary | ICD-10-CM | POA: Diagnosis not present

## 2017-07-11 DIAGNOSIS — G4733 Obstructive sleep apnea (adult) (pediatric): Secondary | ICD-10-CM | POA: Diagnosis not present

## 2017-07-11 DIAGNOSIS — I5032 Chronic diastolic (congestive) heart failure: Secondary | ICD-10-CM | POA: Diagnosis not present

## 2017-07-11 DIAGNOSIS — Z471 Aftercare following joint replacement surgery: Secondary | ICD-10-CM | POA: Diagnosis not present

## 2017-07-11 DIAGNOSIS — F329 Major depressive disorder, single episode, unspecified: Secondary | ICD-10-CM | POA: Diagnosis not present

## 2017-07-11 DIAGNOSIS — Z96651 Presence of right artificial knee joint: Secondary | ICD-10-CM

## 2017-07-11 DIAGNOSIS — E1122 Type 2 diabetes mellitus with diabetic chronic kidney disease: Secondary | ICD-10-CM | POA: Diagnosis not present

## 2017-07-11 DIAGNOSIS — K219 Gastro-esophageal reflux disease without esophagitis: Secondary | ICD-10-CM | POA: Diagnosis not present

## 2017-07-11 DIAGNOSIS — D860 Sarcoidosis of lung: Secondary | ICD-10-CM | POA: Diagnosis not present

## 2017-07-11 DIAGNOSIS — D631 Anemia in chronic kidney disease: Secondary | ICD-10-CM | POA: Diagnosis not present

## 2017-07-11 DIAGNOSIS — N183 Chronic kidney disease, stage 3 (moderate): Secondary | ICD-10-CM | POA: Diagnosis not present

## 2017-07-11 DIAGNOSIS — I130 Hypertensive heart and chronic kidney disease with heart failure and stage 1 through stage 4 chronic kidney disease, or unspecified chronic kidney disease: Secondary | ICD-10-CM | POA: Diagnosis not present

## 2017-07-11 NOTE — Progress Notes (Signed)
Office Visit Note   Patient: [name]           Date of Birth: 1961-06-14           MRN: [id number] Visit Date: 07/11/2017              Requested by: [name], MD [address] [place], [place] [address] PCP: [name], MD  No chief complaint on file.     HPI: Patient is a 56 year old woman who is 2 weeks status post right total knee arthroplasty.  Assessment & Plan: Visit Diagnoses:  1. S/P total knee arthroplasty, right     Plan: We will have her complete her home health physical therapy she is given a prescription for #physical therapy with follow-up in 3 weeks. She is given instructions for scar massage knee extension exercises  Follow-Up Instructions: Return in about 3 weeks (around 08/01/2017).   Ortho Exam  Patient is alert, oriented, no adenopathy, well-dressed, normal affect, normal respiratory effort. Examination the incision is healing nicely. She has full extension and flexion to 90 we will harvest the staples today  Imaging: Xr Knee 1-2 Views Right  Result Date: 07/11/2017 2 view radiographs of the right knee shows a stable total knee arthroplasty without malalignment with no complicating features.  No images are attached to the encounter.  Labs: Lab Results  Component Value Date   HGBA1C 7.5 (H) 06/17/2017   HGBA1C 8.9 (H) 03/09/2017   HGBA1C 8.7 09/29/2016   ESRSEDRATE 28 06/16/2017   ESRSEDRATE 5 01/26/2017   ESRSEDRATE 27 10/14/2016   CRP 27.1 (H) 06/16/2017   CRP 17.3 (H) 01/26/2017   CRP 24.0 (H) 10/14/2016   LABURIC 5.1 01/26/2017   REPTSTATUS 10/14/2015 FINAL 10/14/2015   REPTSTATUS 10/16/2015 FINAL 10/14/2015   GRAMSTAIN  10/14/2015    MODERATE WBC PRESENT,BOTH PMN AND MONONUCLEAR FEW SQUAMOUS EPITHELIAL CELLS PRESENT ABUNDANT GRAM POSITIVE COCCI IN PAIRS IN CLUSTERS Performed at [facility]  10/14/2015    NORMAL OROPHARYNGEAL FLORA Performed at [place] ([place]) ISOLATED 06/01/2017    Orders:  Orders Placed This Encounter  Procedures  . XR Knee 1-2 Views Right   No orders of the defined types were placed in this encounter.    Procedures: No procedures performed  Clinical Data: No additional findings.  ROS:  All other systems negative, except as noted in the HPI. Review of Systems  Objective: Vital Signs: There were no vitals taken for this visit.  Specialty Comments:  No specialty comments available.  PMFS History: Patient Active Problem List   Diagnosis Date Noted  . S/P total knee arthroplasty, right 06/29/2017  . Osteoarthrosis, localized, primary, knee, right   . Sprain of calcaneofibular ligament of right ankle 06/06/2017  . Acute left ankle pain 01/26/2017  . Osteoarthritis of right knee 01/26/2017  . Multiple allergies 10/14/2016  . Onychomycosis 10/27/2015  . Osteomyelitis of left foot ([place]) 05/29/2015  . CKD (chronic kidney disease) stage 3, GFR 30-59 ml/min (HCC) 04/27/2015  . MRSA (methicillin resistant staph aureus) culture positive 03/27/2015  . Wound infection complicating hardware ([place]) 03/27/2015  . Fatty liver 09/30/2014  . Hot flashes 07/15/2014  . Abnormal SPEP 04/17/2014  . Fracture of left leg 04/17/2014  . Cushingoid side effect of steroids ([place]) 04/17/2014  . Internal hemorrhoids   . Depression   . Preoperative clearance 03/25/2014  . Obstructive chronic bronchitis without exacerbation ([place])  09/18/2013  . Chest pain 04/11/2013  . Hypertensive heart disease with chronic diastolic congestive heart failure ([place])   . Solitary pulmonary nodule, on CT 02/2013 - stable over 2 years in 2015 02/20/2013  . Gout 08/20/2010  . Anemia 09/18/2009  . Morbid obesity ([place]) 06/04/2009  . Sleep apnea 04/21/2009  . Sarcoidosis of lung ([place]) 04/10/2007  . Hyperlipidemia 08/21/2006  . Essential hypertension 08/21/2006  . GERD 08/21/2006  . Type II diabetes mellitus with neurological  manifestations, uncontrolled ([place]) 08/21/2006   Past Medical History:  Diagnosis Date  . Abnormal SPEP 04/17/2014  . Acute left ankle pain 01/26/2017  . ANEMIA-UNSPECIFIED 09/18/2009  . Chronic diastolic heart failure, NYHA class 2 (HCC)    LVEDP roughly 20% by cath  . COPD (chronic obstructive pulmonary disease) ([place])   . Depression   . DIABETES MELLITUS, TYPE II 08/21/2006  . Diabetic osteomyelitis ([place]) 05/29/2015  . Fracture of 5th metatarsal    non union  . GERD 08/21/2006  . GOUT 08/20/2010  . Hx of umbilical hernia repair   . HYPERLIPIDEMIA 08/21/2006  . HYPERTENSION 08/21/2006  . Infection of wound due to methicillin resistant Staphylococcus aureus (MRSA)   . Internal hemorrhoids   . Multiple allergies 10/14/2016  . OBESITY 06/04/2009  . Onychomycosis 10/27/2015  . Osteomyelitis of left foot ([place]) 05/29/2015  . Pulmonary sarcoidosis ([place])    Followed locally by pulmonology, but also by Dr. Casper Harrison at [facility]  . Right knee pain 01/26/2017  . Vocal cord dysfunction   . Wears partial dentures     Family History  Problem Relation Age of Onset  . Diabetes Father   . Heart attack Father 26  . Coronary artery disease Father   . Heart failure Father   . COPD Mother   . Emphysema Mother   . Asthma Mother   . Heart failure Mother   . Breast cancer Mother   . Heart attack Maternal Grandfather   . Sarcoidosis Maternal Uncle   . Lung cancer Brother   . Diabetes Brother   . Colon cancer Neg Hx     Past Surgical History:  Procedure Laterality Date  . ABDOMINAL HYSTERECTOMY    . APPENDECTOMY    . BLADDER SUSPENSION  11/11/2011   Procedure: TRANSVAGINAL TAPE (TVT) PROCEDURE;  Surgeon: Olga Millers, MD;  Location: [facility];  Service: Gynecology;  Laterality: N/A;  . CARDIAC CATHETERIZATION  07/2010   LVEF 50-55% WITH VERY MILD GLOBAL HYPOKINESIA; ESSENTIALLY NORMAL CORONARY ARTERIES; NORMAL LV FUNCTION  . CAROTIDS  02/18/11   CAROTID DUPLEX; VERTEBRALS ARE PATENT  WITH ANTEGRADE FLOW. ICA/CCA RATIO 1.61 ON RIGHT AND 0.75 ON LEFT  . CHOLECYSTECTOMY  1984  . CYSTOSCOPY  11/11/2011   Procedure: CYSTOSCOPY;  Surgeon: Olga Millers, MD;  Location: [facility];  Service: Gynecology;  Laterality: N/A;  . DOPPLER ECHOCARDIOGRAPHY  02/12/2013   LV FUNCTION, SIZE NORMAL; MILD CONCENTRIC LVH; EST EF 55-65%; WALL MOTION NORMAL  . FRACTURE SURGERY     foot  . HERNIA REPAIR    . I&D EXTREMITY Left 06/27/2015   Procedure: Partial Excision Left Calcaneus, Place Antibiotic Beads, and Wound VAC;  Surgeon: Newt Minion, MD;  Location: [facility];  Service: Orthopedics;  Laterality: Left;  . KNEE ARTHROSCOPY     right  . LEFT AND RIGHT HEART CATHETERIZATION WITH CORONARY ANGIOGRAM N/A 04/23/2013   Procedure: LEFT AND RIGHT HEART CATHETERIZATION WITH CORONARY ANGIOGRAM;  Surgeon: Leonie Man, MD;  Location: [facility]  CATH LAB;  Service: Cardiovascular;  Laterality: N/A;  . LEFT HEART CATH AND CORONARY ANGIOGRAPHY N/A 03/11/2017   Procedure: Left Heart Cath and Coronary Angiography;  Surgeon: Sherren Mocha, MD;  Location: [facility] CV LAB;  Service: Cardiovascular;  Laterality: N/A;  . LexiScan Myoview  03/09/2013   EF 50%; NORMAL MYOCARDIAL PERFUSION STUDY - breast attenuation  . METATARSAL OSTEOTOMY WITH OPEN REDUCTION INTERNAL FIXATION (ORIF) METATARSAL WITH FUSION Left 04/09/2014   Procedure: LEFT FOOT FRACTURE OPEN TREATMENT METATARSAL INCLUDES INTERNAL FIXATION EACH;  Surgeon: Lorn Junes, MD;  Location: [facility];  Service: Orthopedics;  Laterality: Left;  . NISSEN FUNDOPLICATION  0947  . Right and left CARDIAC CATHETERIZATION  04/23/2013   Angiographic normal coronaries; LVEDP 20 mmHg, PCWP 12-14 mmHg, RAP 12 mmHg.; Fick CO/CI 4.9/2.2  . TOTAL KNEE ARTHROPLASTY Right 06/29/2017   Procedure: RIGHT TOTAL KNEE ARTHROPLASTY;  Surgeon: Newt Minion, MD;  Location: [facility];  Service: Orthopedics;  Laterality: Right;  . TUBAL LIGATION     with reversal in 1994  .  VENTRAL HERNIA REPAIR     Social History   Occupational History  . DISABLED    Social History Main Topics  . Smoking status: Never Smoker  . Smokeless tobacco: Never Used  . Alcohol use No  . Drug use: No  . Sexual activity: Yes    Birth control/ protection: Surgical

## 2017-07-13 ENCOUNTER — Other Ambulatory Visit: Payer: Self-pay | Admitting: Family Medicine

## 2017-07-13 ENCOUNTER — Encounter (INDEPENDENT_AMBULATORY_CARE_PROVIDER_SITE_OTHER): Payer: Self-pay | Admitting: Orthopedic Surgery

## 2017-07-13 ENCOUNTER — Ambulatory Visit (INDEPENDENT_AMBULATORY_CARE_PROVIDER_SITE_OTHER): Payer: BLUE CROSS/BLUE SHIELD | Admitting: Orthopedic Surgery

## 2017-07-13 ENCOUNTER — Ambulatory Visit (INDEPENDENT_AMBULATORY_CARE_PROVIDER_SITE_OTHER): Payer: BLUE CROSS/BLUE SHIELD

## 2017-07-13 DIAGNOSIS — D631 Anemia in chronic kidney disease: Secondary | ICD-10-CM | POA: Diagnosis not present

## 2017-07-13 DIAGNOSIS — N183 Chronic kidney disease, stage 3 (moderate): Secondary | ICD-10-CM | POA: Diagnosis not present

## 2017-07-13 DIAGNOSIS — J449 Chronic obstructive pulmonary disease, unspecified: Secondary | ICD-10-CM | POA: Diagnosis not present

## 2017-07-13 DIAGNOSIS — I5032 Chronic diastolic (congestive) heart failure: Secondary | ICD-10-CM | POA: Diagnosis not present

## 2017-07-13 DIAGNOSIS — I130 Hypertensive heart and chronic kidney disease with heart failure and stage 1 through stage 4 chronic kidney disease, or unspecified chronic kidney disease: Secondary | ICD-10-CM | POA: Diagnosis not present

## 2017-07-13 DIAGNOSIS — Z96651 Presence of right artificial knee joint: Secondary | ICD-10-CM

## 2017-07-13 DIAGNOSIS — D860 Sarcoidosis of lung: Secondary | ICD-10-CM | POA: Diagnosis not present

## 2017-07-13 DIAGNOSIS — Z471 Aftercare following joint replacement surgery: Secondary | ICD-10-CM | POA: Diagnosis not present

## 2017-07-13 DIAGNOSIS — M109 Gout, unspecified: Secondary | ICD-10-CM | POA: Diagnosis not present

## 2017-07-13 DIAGNOSIS — K219 Gastro-esophageal reflux disease without esophagitis: Secondary | ICD-10-CM | POA: Diagnosis not present

## 2017-07-13 DIAGNOSIS — E1122 Type 2 diabetes mellitus with diabetic chronic kidney disease: Secondary | ICD-10-CM | POA: Diagnosis not present

## 2017-07-13 DIAGNOSIS — M79671 Pain in right foot: Secondary | ICD-10-CM

## 2017-07-13 DIAGNOSIS — M722 Plantar fascial fibromatosis: Secondary | ICD-10-CM

## 2017-07-13 DIAGNOSIS — G4733 Obstructive sleep apnea (adult) (pediatric): Secondary | ICD-10-CM | POA: Diagnosis not present

## 2017-07-13 DIAGNOSIS — F329 Major depressive disorder, single episode, unspecified: Secondary | ICD-10-CM | POA: Diagnosis not present

## 2017-07-13 NOTE — Progress Notes (Signed)
Office Visit Note   Patient: [name]           Date of Birth: [birth year]           MRN: [id number] Visit Date: 07/13/2017              Requested by: [name], MD [place], [place] [address] PCP: [name], MD  Chief Complaint  Patient presents with  . Right Foot - Pain      HPI: Patient is a 56 year old woman status post right total knee arthroplasties been having pain over the plantar fascia which has been keeping her up at night She is extremely pleased with her knee and has no complaints with her knee.  Assessment & Plan: Visit Diagnoses:  1. Pain in right foot   2. S/P total knee arthroplasty, right   3. Plantar fasciitis, right     Plan: Recommended stiff soled sneakers over-the-counter orthotics Achilles stretching she can use Aspercreme on the plantar fascia.  Follow-Up Instructions: Return in about 3 weeks (around 08/03/2017).   Ortho Exam  Patient is alert, oriented, no adenopathy, well-dressed, normal affect, normal respiratory effort. Examination her knee has excellent range of motion the incision is well-healed she has full extension there is no redness no cellulitis. Examination of her foot she has a good pulse her foot is neurovascularly intact she has tenderness to palpation along the plantar fascia. The calcaneus is nontender with compression.  Imaging: Xr Foot Complete Right  Result Date: 07/13/2017 Three-view radiographs of the right foot shows normal alignment she has a long second and third metatarsal there are no stress fractures.  No images are attached to the encounter.  Labs: Lab Results  Component Value Date   HGBA1C 7.5 (H) 06/17/2017   HGBA1C 8.9 (H) 03/09/2017   HGBA1C 8.7 09/29/2016   ESRSEDRATE 28 06/16/2017   ESRSEDRATE 5 01/26/2017   ESRSEDRATE 27 10/14/2016   CRP 27.1 (H) 06/16/2017   CRP 17.3 (H) 01/26/2017   CRP 24.0 (H) 10/14/2016   LABURIC 5.1 01/26/2017   REPTSTATUS 10/14/2015  FINAL 10/14/2015   REPTSTATUS 10/16/2015 FINAL 10/14/2015   GRAMSTAIN  10/14/2015    MODERATE WBC PRESENT,BOTH PMN AND MONONUCLEAR FEW SQUAMOUS EPITHELIAL CELLS PRESENT ABUNDANT GRAM POSITIVE COCCI IN PAIRS IN CLUSTERS Performed at [facility]  10/14/2015    NORMAL OROPHARYNGEAL FLORA Performed at [place] ([place]) ISOLATED 06/01/2017    Orders:  Orders Placed This Encounter  Procedures  . XR Foot Complete Right   No orders of the defined types were placed in this encounter.    Procedures: No procedures performed  Clinical Data: No additional findings.  ROS:  All other systems negative, except as noted in the HPI. Review of Systems  Objective: Vital Signs: There were no vitals taken for this visit.  Specialty Comments:  No specialty comments available.  PMFS History: Patient Active Problem List   Diagnosis Date Noted  . Plantar fasciitis, right 07/13/2017  . S/P total knee arthroplasty, right 06/29/2017  . Osteoarthrosis, localized, primary, knee, right   . Sprain of calcaneofibular ligament of right ankle 06/06/2017  . Acute left ankle pain 01/26/2017  . Osteoarthritis of right knee 01/26/2017  . Multiple allergies 10/14/2016  . Onychomycosis 10/27/2015  . Osteomyelitis of left foot ([place]) 05/29/2015  . CKD (chronic kidney disease) stage 3, GFR 30-59 ml/min (HCC) 04/27/2015  . MRSA (methicillin  resistant staph aureus) culture positive 03/27/2015  . Wound infection complicating hardware ([place]) 03/27/2015  . Fatty liver 09/30/2014  . Hot flashes 07/15/2014  . Abnormal SPEP 04/17/2014  . Fracture of left leg 04/17/2014  . Cushingoid side effect of steroids ([place]) 04/17/2014  . Internal hemorrhoids   . Depression   . Preoperative clearance 03/25/2014  . Obstructive chronic bronchitis without exacerbation ([place]) 09/18/2013  . Chest pain 04/11/2013  . Hypertensive heart disease with chronic diastolic  congestive heart failure ([place])   . Solitary pulmonary nodule, on CT 02/2013 - stable over 2 years in 2015 02/20/2013  . Gout 08/20/2010  . Anemia 09/18/2009  . Morbid obesity ([place]) 06/04/2009  . Sleep apnea 04/21/2009  . Sarcoidosis of lung ([place]) 04/10/2007  . Hyperlipidemia 08/21/2006  . Essential hypertension 08/21/2006  . GERD 08/21/2006  . Type II diabetes mellitus with neurological manifestations, uncontrolled ([place]) 08/21/2006   Past Medical History:  Diagnosis Date  . Abnormal SPEP 04/17/2014  . Acute left ankle pain 01/26/2017  . ANEMIA-UNSPECIFIED 09/18/2009  . Chronic diastolic heart failure, NYHA class 2 (HCC)    LVEDP roughly 20% by cath  . COPD (chronic obstructive pulmonary disease) ([place])   . Depression   . DIABETES MELLITUS, TYPE II 08/21/2006  . Diabetic osteomyelitis ([place]) 05/29/2015  . Fracture of 5th metatarsal    non union  . GERD 08/21/2006  . GOUT 08/20/2010  . Hx of umbilical hernia repair   . HYPERLIPIDEMIA 08/21/2006  . HYPERTENSION 08/21/2006  . Infection of wound due to methicillin resistant Staphylococcus aureus (MRSA)   . Internal hemorrhoids   . Multiple allergies 10/14/2016  . OBESITY 06/04/2009  . Onychomycosis 10/27/2015  . Osteomyelitis of left foot ([place]) 05/29/2015  . Pulmonary sarcoidosis ([place])    Followed locally by pulmonology, but also by Dr. Casper Harrison at [facility]  . Right knee pain 01/26/2017  . Vocal cord dysfunction   . Wears partial dentures     Family History  Problem Relation Age of Onset  . Diabetes Father   . Heart attack Father 98  . Coronary artery disease Father   . Heart failure Father   . COPD Mother   . Emphysema Mother   . Asthma Mother   . Heart failure Mother   . Breast cancer Mother   . Heart attack Maternal Grandfather   . Sarcoidosis Maternal Uncle   . Lung cancer Brother   . Diabetes Brother   . Colon cancer Neg Hx     Past Surgical History:  Procedure Laterality Date  . ABDOMINAL HYSTERECTOMY      . APPENDECTOMY    . BLADDER SUSPENSION  11/11/2011   Procedure: TRANSVAGINAL TAPE (TVT) PROCEDURE;  Surgeon: Olga Millers, MD;  Location: [facility];  Service: Gynecology;  Laterality: N/A;  . CARDIAC CATHETERIZATION  07/2010   LVEF 50-55% WITH VERY MILD GLOBAL HYPOKINESIA; ESSENTIALLY NORMAL CORONARY ARTERIES; NORMAL LV FUNCTION  . CAROTIDS  02/18/11   CAROTID DUPLEX; VERTEBRALS ARE PATENT WITH ANTEGRADE FLOW. ICA/CCA RATIO 1.61 ON RIGHT AND 0.75 ON LEFT  . CHOLECYSTECTOMY  1984  . CYSTOSCOPY  11/11/2011   Procedure: CYSTOSCOPY;  Surgeon: Olga Millers, MD;  Location: [facility];  Service: Gynecology;  Laterality: N/A;  . DOPPLER ECHOCARDIOGRAPHY  02/12/2013   LV FUNCTION, SIZE NORMAL; MILD CONCENTRIC LVH; EST EF 55-65%; WALL MOTION NORMAL  . FRACTURE SURGERY     foot  . HERNIA REPAIR    . I&D EXTREMITY Left 06/27/2015  Procedure: Partial Excision Left Calcaneus, Place Antibiotic Beads, and Wound VAC;  Surgeon: Newt Minion, MD;  Location: [facility];  Service: Orthopedics;  Laterality: Left;  . KNEE ARTHROSCOPY     right  . LEFT AND RIGHT HEART CATHETERIZATION WITH CORONARY ANGIOGRAM N/A 04/23/2013   Procedure: LEFT AND RIGHT HEART CATHETERIZATION WITH CORONARY ANGIOGRAM;  Surgeon: Leonie Man, MD;  Location: [facility] CATH LAB;  Service: Cardiovascular;  Laterality: N/A;  . LEFT HEART CATH AND CORONARY ANGIOGRAPHY N/A 03/11/2017   Procedure: Left Heart Cath and Coronary Angiography;  Surgeon: Sherren Mocha, MD;  Location: [facility] CV LAB;  Service: Cardiovascular;  Laterality: N/A;  . LexiScan Myoview  03/09/2013   EF 50%; NORMAL MYOCARDIAL PERFUSION STUDY - breast attenuation  . METATARSAL OSTEOTOMY WITH OPEN REDUCTION INTERNAL FIXATION (ORIF) METATARSAL WITH FUSION Left 04/09/2014   Procedure: LEFT FOOT FRACTURE OPEN TREATMENT METATARSAL INCLUDES INTERNAL FIXATION EACH;  Surgeon: Lorn Junes, MD;  Location: [facility];  Service: Orthopedics;  Laterality: Left;  . NISSEN  FUNDOPLICATION  9678  . Right and left CARDIAC CATHETERIZATION  04/23/2013   Angiographic normal coronaries; LVEDP 20 mmHg, PCWP 12-14 mmHg, RAP 12 mmHg.; Fick CO/CI 4.9/2.2  . TOTAL KNEE ARTHROPLASTY Right 06/29/2017   Procedure: RIGHT TOTAL KNEE ARTHROPLASTY;  Surgeon: Newt Minion, MD;  Location: [facility];  Service: Orthopedics;  Laterality: Right;  . TUBAL LIGATION     with reversal in 1994  . VENTRAL HERNIA REPAIR     Social History   Occupational History  . DISABLED    Social History Main Topics  . Smoking status: Never Smoker  . Smokeless tobacco: Never Used  . Alcohol use No  . Drug use: No  . Sexual activity: Yes    Birth control/ protection: Surgical

## 2017-07-15 DIAGNOSIS — D860 Sarcoidosis of lung: Secondary | ICD-10-CM | POA: Diagnosis not present

## 2017-07-15 DIAGNOSIS — Z471 Aftercare following joint replacement surgery: Secondary | ICD-10-CM | POA: Diagnosis not present

## 2017-07-15 DIAGNOSIS — E1122 Type 2 diabetes mellitus with diabetic chronic kidney disease: Secondary | ICD-10-CM | POA: Diagnosis not present

## 2017-07-15 DIAGNOSIS — N183 Chronic kidney disease, stage 3 (moderate): Secondary | ICD-10-CM | POA: Diagnosis not present

## 2017-07-15 DIAGNOSIS — J449 Chronic obstructive pulmonary disease, unspecified: Secondary | ICD-10-CM | POA: Diagnosis not present

## 2017-07-15 DIAGNOSIS — I5032 Chronic diastolic (congestive) heart failure: Secondary | ICD-10-CM | POA: Diagnosis not present

## 2017-07-15 DIAGNOSIS — M109 Gout, unspecified: Secondary | ICD-10-CM | POA: Diagnosis not present

## 2017-07-15 DIAGNOSIS — F329 Major depressive disorder, single episode, unspecified: Secondary | ICD-10-CM | POA: Diagnosis not present

## 2017-07-15 DIAGNOSIS — K219 Gastro-esophageal reflux disease without esophagitis: Secondary | ICD-10-CM | POA: Diagnosis not present

## 2017-07-15 DIAGNOSIS — I130 Hypertensive heart and chronic kidney disease with heart failure and stage 1 through stage 4 chronic kidney disease, or unspecified chronic kidney disease: Secondary | ICD-10-CM | POA: Diagnosis not present

## 2017-07-15 DIAGNOSIS — D631 Anemia in chronic kidney disease: Secondary | ICD-10-CM | POA: Diagnosis not present

## 2017-07-15 DIAGNOSIS — G4733 Obstructive sleep apnea (adult) (pediatric): Secondary | ICD-10-CM | POA: Diagnosis not present

## 2017-07-20 DIAGNOSIS — M25461 Effusion, right knee: Secondary | ICD-10-CM | POA: Diagnosis not present

## 2017-07-20 DIAGNOSIS — M25561 Pain in right knee: Secondary | ICD-10-CM | POA: Diagnosis not present

## 2017-07-20 DIAGNOSIS — R269 Unspecified abnormalities of gait and mobility: Secondary | ICD-10-CM | POA: Diagnosis not present

## 2017-07-20 DIAGNOSIS — M25661 Stiffness of right knee, not elsewhere classified: Secondary | ICD-10-CM | POA: Diagnosis not present

## 2017-07-22 DIAGNOSIS — M25461 Effusion, right knee: Secondary | ICD-10-CM | POA: Diagnosis not present

## 2017-07-22 DIAGNOSIS — R269 Unspecified abnormalities of gait and mobility: Secondary | ICD-10-CM | POA: Diagnosis not present

## 2017-07-22 DIAGNOSIS — M25561 Pain in right knee: Secondary | ICD-10-CM | POA: Diagnosis not present

## 2017-07-22 DIAGNOSIS — M25661 Stiffness of right knee, not elsewhere classified: Secondary | ICD-10-CM | POA: Diagnosis not present

## 2017-07-24 ENCOUNTER — Other Ambulatory Visit: Payer: Self-pay | Admitting: Endocrinology

## 2017-07-24 ENCOUNTER — Other Ambulatory Visit: Payer: Self-pay | Admitting: Cardiology

## 2017-07-25 ENCOUNTER — Ambulatory Visit (INDEPENDENT_AMBULATORY_CARE_PROVIDER_SITE_OTHER): Payer: BLUE CROSS/BLUE SHIELD | Admitting: Pulmonary Disease

## 2017-07-25 ENCOUNTER — Encounter: Payer: Self-pay | Admitting: Pulmonary Disease

## 2017-07-25 VITALS — BP 118/58 | HR 76 | Ht 66.0 in | Wt 249.8 lb

## 2017-07-25 DIAGNOSIS — D860 Sarcoidosis of lung: Secondary | ICD-10-CM

## 2017-07-25 DIAGNOSIS — M25661 Stiffness of right knee, not elsewhere classified: Secondary | ICD-10-CM | POA: Diagnosis not present

## 2017-07-25 DIAGNOSIS — J449 Chronic obstructive pulmonary disease, unspecified: Secondary | ICD-10-CM

## 2017-07-25 DIAGNOSIS — M25461 Effusion, right knee: Secondary | ICD-10-CM | POA: Diagnosis not present

## 2017-07-25 DIAGNOSIS — R269 Unspecified abnormalities of gait and mobility: Secondary | ICD-10-CM | POA: Diagnosis not present

## 2017-07-25 DIAGNOSIS — M25561 Pain in right knee: Secondary | ICD-10-CM | POA: Diagnosis not present

## 2017-07-25 NOTE — Patient Instructions (Signed)
I'm glad to doing well after the surgery Continue using inhaler and prednisone. Please follow-up with Dr. Casper Harrison at [facility] for further management I'll see back in clinic in 1 year.

## 2017-07-25 NOTE — Progress Notes (Signed)
Subjective:    Patient ID: [name], female    DOB: 11-30-1960, 56 y.o.   MRN: [id number]  PROBLEM LIST: Sarcoidosis Chronic bronchitis Vocal cord dysfunction  HPI  Follow-up for sarcoidosis, bronchitis. [name] is a 56 year old former patient of Dr. Joya Gaskins. She was diagnosed with sarcoidosis with mediastinoscopy in 2008. He is followed by Dr. Casper Harrison in [facility] with regular biannual follow-ups. She had been on high doses of prednisone. This has been slowly tapered to 5 mg a day. She is being maintained on this for several years now without any exacerbations. Review of her lung function tests and imaging show moderate restrictive defects with mild reduction in DLCO. The CT scan does not show any obvious pulmonary involvement with her sarcoidosis. Her sarcoidosis is believed to have an airway component as well since she has recurrent attacks of bronchitis. She is also diagnosed with vocal cord dysfunction by Dr. Joya Gaskins several years ago.  She was hospitalized in February 2017 with respiratory distress. It was not clear if this was a sarcoid flare as a CXR at that time did not show any acute abnormality. She was treated with antibiotics and prednisone. She feels well now. She does complain of some sinus pressure and nasal discharge. No fevers or chills.  Interim History: She has remained stable with respect to her lung symptoms.  Denies any cough, dyspnea, chest pain.  There have not been any hospitalizations or ER visits since last visit.  Underwent total knee replacement on the right in September 2018 without any issues.  She is recovering well from surgery. Also had cataract surgery and her ophthalmologist had noted an increase in her ocular sarcoid and she was briefly on prednisone 20 mg.  She is now back to her baseline dose of 5 mg.  DATA: CT chest (07/03/13) No CT findings to suggest sarcoidosis. A single tiny punctate indeterminate nodule is noted. The nodule's oval  configuration makes an intrapulmonary lymph node or other benign entity more likely. If patient has no risk factors for lung cancer, no  further followup would be warranted.  Chest x-ray 11/30/15 No acute cardiopulmonary disease.  PFTs 02/12/16 FVC 2.12 (58%), FEV1 1.75 (1%), F/F 83, TLC 75%, DLCO 65% Moderate restriction and reduction in diffusion capacity. Improved compared to 2015  10/22/14 FVC 2.12 (57%), FEV1 1.57 (54%), F/F 74, DLCO 74.5%  03/25/14  FVC 2.09 [60%], FEV1 1.63 (59%], F/F 78 Moderate restriction  Surgical pathology, mediastinal lymph nodes 11/07/06 Nonnecrotizing granulomas.  Past Medical History:  Diagnosis Date  . Abnormal SPEP 04/17/2014  . Acute left ankle pain 01/26/2017  . ANEMIA-UNSPECIFIED 09/18/2009  . Chronic diastolic heart failure, NYHA class 2 (HCC)    LVEDP roughly 20% by cath  . COPD (chronic obstructive pulmonary disease) ([place])   . Depression   . DIABETES MELLITUS, TYPE II 08/21/2006  . Diabetic osteomyelitis ([place]) 05/29/2015  . Fracture of 5th metatarsal    non union  . GERD 08/21/2006  . GOUT 08/20/2010  . Hx of umbilical hernia repair   . HYPERLIPIDEMIA 08/21/2006  . HYPERTENSION 08/21/2006  . Infection of wound due to methicillin resistant Staphylococcus aureus (MRSA)   . Internal hemorrhoids   . Multiple allergies 10/14/2016  . OBESITY 06/04/2009  . Onychomycosis 10/27/2015  . Osteomyelitis of left foot ([place]) 05/29/2015  . Pulmonary sarcoidosis ([place])    Followed locally by pulmonology, but also by Dr. Casper Harrison at [facility]  . Right knee pain 01/26/2017  . Vocal cord dysfunction   .  Wears partial dentures      Current Outpatient Prescriptions:  .  albuterol (PROVENTIL HFA;VENTOLIN HFA) 108 (90 BASE) MCG/ACT inhaler, Inhale 1-2 puffs into the lungs every 6 (six) hours as needed for wheezing or shortness of breath., Disp: 8 g, Rfl: 5 .  albuterol (PROVENTIL) (2.5 MG/3ML) 0.083% nebulizer solution, Take 2.5 mg by nebulization every  6 (six) hours as needed for wheezing or shortness of breath., Disp: , Rfl:  .  allopurinol (ZYLOPRIM) 100 MG tablet, Take 100 mg by mouth at bedtime. , Disp: , Rfl:  .  aspirin EC 81 MG tablet, Take 81 mg by mouth daily. , Disp: , Rfl:  .  atorvastatin (LIPITOR) 40 MG tablet, TAKE 1 TAB BY MOUTH ONCE DAILY, Disp: 90 tablet, Rfl: 3 .  BAYER MICROLET LANCETS lancets, Use as instructed to check sugar 3 times daily, Disp: 300 each, Rfl: 5 .  benzonatate (TESSALON) 100 MG capsule, Take 100 mg by mouth 3 (three) times daily as needed for cough. Reported on 01/05/2016, Disp: , Rfl:  .  Blood Glucose Monitoring Suppl (BAYER CONTOUR MONITOR) w/Device KIT, Use daily to check sugar, Disp: 1 kit, Rfl: 0 .  buPROPion (WELLBUTRIN XL) 300 MG 24 hr tablet, TAKE 1 TABLET (300 MG TOTAL) BY MOUTH DAILY., Disp: 90 tablet, Rfl: 3 .  carvedilol (COREG) 25 MG tablet, Take 1 tablet (25 mg total) by mouth 2 (two) times daily with a meal., Disp: 180 tablet, Rfl: 3 .  DEXILANT 60 MG capsule, TAKE 1 CAPSULE BY MOUTH DAILY (Patient taking differently: TAKE 1 CAPSULE (60 MG) BY MOUTH DAILY), Disp: 30 capsule, Rfl: 1 .  diclofenac sodium (VOLTAREN) 1 % GEL, APPLY 2 GRAMS TOPICALLY 4 TIMES A DAY (Patient taking differently: APPLY 2 GRAMS TOPICALLY 4 TIMES A DAY AS NEEDED FOR KNEE PAIN), Disp: 100 g, Rfl: 0 .  diltiazem (CARDIZEM CD) 120 MG 24 hr capsule, Take 1 capsule (120 mg total) by mouth daily., Disp: 90 capsule, Rfl: 3 .  estradiol (ESTRACE) 2 MG tablet, Take 2 mg by mouth at bedtime. , Disp: , Rfl:  .  fenofibrate (TRICOR) 145 MG tablet, TAKE 1 TABLET (145 MG TOTAL) BY MOUTH DAILY., Disp: 30 tablet, Rfl: 6 .  fluticasone (FLONASE) 50 MCG/ACT nasal spray, Place 2 sprays into both nostrils daily., Disp: 48 g, Rfl: 3 .  furosemide (LASIX) 40 MG tablet, TAKE 1 TABLET BY MOUTH TWICE A DAY (Patient taking differently: TAKE 1 TABLET (40 MG) BY MOUTH TWICE A DAY), Disp: 60 tablet, Rfl: 11 .  gabapentin (NEURONTIN) 100 MG capsule,  TAKE 1 CAPSULE BY MOUTH THREE TIMES A DAY, Disp: 90 capsule, Rfl: 0 .  glucose blood (BAYER CONTOUR TEST) test strip, Use as instructed to check sugar 3 times daily, Disp: 300 each, Rfl: 5 .  insulin aspart (NOVOLOG FLEXPEN) 100 UNIT/ML FlexPen, Inject 22-27 Units into the skin 3 (three) times daily with meals., Disp: 30 mL, Rfl: 5 .  Insulin Glargine (TOUJEO MAX SOLOSTAR) 300 UNIT/ML SOPN, Inject 45 Units into the skin at bedtime., Disp: 3 pen, Rfl: 11 .  Insulin Pen Needle 33G X 4 MM MISC, 1 each by Does not apply route 4 (four) times daily., Disp: 200 each, Rfl: 11 .  KLOR-CON M20 20 MEQ tablet, TAKE 1 TABLET BY MOUTH 3 TIMES DAILY, Disp: 90 tablet, Rfl: 5 .  LORazepam (ATIVAN) 0.5 MG tablet, Take 0.5 mg by mouth every 12 (twelve) hours as needed for anxiety., Disp: , Rfl:  .  losartan (COZAAR) 50 MG tablet, TAKE 1 TABLET (50 MG TOTAL) BY MOUTH DAILY., Disp: , Rfl: 3 .  metFORMIN (GLUCOPHAGE) 1000 MG tablet, Take 1 tablet (1,000 mg total) by mouth 2 (two) times daily with a meal., Disp: 180 tablet, Rfl: 0 .  mometasone-formoterol (DULERA) 200-5 MCG/ACT AERO, Inhale 2 puffs into the lungs 2 (two) times daily., Disp: 13 g, Rfl: 5 .  nitroGLYCERIN (NITROSTAT) 0.4 MG SL tablet, Place 1 tablet (0.4 mg total) under the tongue every 5 (five) minutes as needed for chest pain. Reported on 01/05/2016, Disp: 100 tablet, Rfl: 0 .  ondansetron (ZOFRAN-ODT) 4 MG disintegrating tablet, Take 4 mg by mouth every 8 (eight) hours as needed for nausea or vomiting. Reported on 01/05/2016, Disp: , Rfl: 0 .  orlistat (XENICAL) 120 MG capsule, Take 1 capsule (120 mg total) by mouth 3 (three) times daily with meals., Disp: 90 capsule, Rfl: 2 .  oxyCODONE-acetaminophen (ROXICET) 5-325 MG tablet, Take 1 tablet by mouth every 4 (four) hours as needed for severe pain., Disp: 60 tablet, Rfl: 0 .  PARoxetine Mesylate (BRISDELLE) 7.5 MG CAPS, Take 7.5 mg by mouth daily., Disp: , Rfl:  .  Potassium Chloride ER 20 MEQ TBCR, Take 1  tablet by mouth 3 (three) times daily., Disp: 90 tablet, Rfl: 3 .  predniSONE (DELTASONE) 5 MG tablet, Take 5 mg by mouth daily with breakfast., Disp: , Rfl:  .  traMADol (ULTRAM) 50 MG tablet, Take 1 tablet (50 mg total) by mouth every 12 (twelve) hours as needed for moderate pain or severe pain., Disp: 30 tablet, Rfl: 2 .  venlafaxine XR (EFFEXOR-XR) 75 MG 24 hr capsule, Take 2 capsules (150 mg total) by mouth at bedtime., Disp: , Rfl:  .  vitamin B-12 (CYANOCOBALAMIN) 1000 MCG tablet, Take 1,000 mcg by mouth daily., Disp: , Rfl:    Review of Systems Denies any cough, wheezing, sputum production, hemoptysis Denies any chest pain, palpitations. Denies any nausea, vomiting, diarrhea, constipation. Denies any fevers, chills, malaise, fatigue, loss of weight, loss of appetite. All other review of systems are negative.    Objective:   Physical ExamBlood pressure 128/62, pulse 85, height 5' 6"  (1.676 m), weight 259 lb (117.482 kg), SpO2 98 %. Gen: No apparent distress Neuro: No gross focal deficits. Neck: No JVD, lymphadenopathy, thyromegaly. RS: Clear, no wheeze, crackles. [name]: S1-S2 heard, no murmurs rubs gallops. Abdomen: Soft, positive bowel sounds. Extremities: No edema.    Assessment & Plan:  #1 Sarcoidosis Stable with no exacerbations. She is being followed by Dr. Casper [facility] at [facility] who has maintained her on prednisone at 5 mg.  Follows up at [facility] every 6 months  #2 Chronic bronchitis, vocal cord dysfunction. She was previously on [place] and Advair.  At present she is only using albuterol as needed.  Plan: -Continue albuterol and prednisone -Follow up at [facility].  Return in 1 year  Marshell Garfinkel MD [facility] Pager [phone] If no answer or after 3pm call: [phone] 07/25/2017, 9:15 AM

## 2017-07-25 NOTE — Telephone Encounter (Signed)
REFILL 

## 2017-07-27 DIAGNOSIS — R269 Unspecified abnormalities of gait and mobility: Secondary | ICD-10-CM | POA: Diagnosis not present

## 2017-07-27 DIAGNOSIS — M25561 Pain in right knee: Secondary | ICD-10-CM | POA: Diagnosis not present

## 2017-07-27 DIAGNOSIS — M25461 Effusion, right knee: Secondary | ICD-10-CM | POA: Diagnosis not present

## 2017-07-27 DIAGNOSIS — M25661 Stiffness of right knee, not elsewhere classified: Secondary | ICD-10-CM | POA: Diagnosis not present

## 2017-07-28 DIAGNOSIS — R269 Unspecified abnormalities of gait and mobility: Secondary | ICD-10-CM | POA: Diagnosis not present

## 2017-07-28 DIAGNOSIS — M25561 Pain in right knee: Secondary | ICD-10-CM | POA: Diagnosis not present

## 2017-07-28 DIAGNOSIS — M25661 Stiffness of right knee, not elsewhere classified: Secondary | ICD-10-CM | POA: Diagnosis not present

## 2017-07-28 DIAGNOSIS — M25461 Effusion, right knee: Secondary | ICD-10-CM | POA: Diagnosis not present

## 2017-07-30 ENCOUNTER — Other Ambulatory Visit: Payer: Self-pay | Admitting: Pulmonary Disease

## 2017-08-01 ENCOUNTER — Encounter (INDEPENDENT_AMBULATORY_CARE_PROVIDER_SITE_OTHER): Payer: Self-pay | Admitting: Orthopedic Surgery

## 2017-08-01 ENCOUNTER — Ambulatory Visit (INDEPENDENT_AMBULATORY_CARE_PROVIDER_SITE_OTHER): Payer: BLUE CROSS/BLUE SHIELD | Admitting: Orthopedic Surgery

## 2017-08-01 DIAGNOSIS — R269 Unspecified abnormalities of gait and mobility: Secondary | ICD-10-CM | POA: Diagnosis not present

## 2017-08-01 DIAGNOSIS — Z96651 Presence of right artificial knee joint: Secondary | ICD-10-CM

## 2017-08-01 DIAGNOSIS — M25561 Pain in right knee: Secondary | ICD-10-CM | POA: Diagnosis not present

## 2017-08-01 DIAGNOSIS — M25461 Effusion, right knee: Secondary | ICD-10-CM | POA: Diagnosis not present

## 2017-08-01 DIAGNOSIS — M25661 Stiffness of right knee, not elsewhere classified: Secondary | ICD-10-CM | POA: Diagnosis not present

## 2017-08-01 NOTE — Progress Notes (Signed)
Office Visit Note   Patient: [name]           Date of Birth: Mar 05, 1961           MRN: [id number] Visit Date: 08/01/2017              Requested by: [name], MD [place], [place] [address] PCP: [name], MD  Chief Complaint  Patient presents with  . Right Knee - Routine Post Op      HPI: Patient is a 56 year old woman who presents 4 weeks status post right total knee arthroplasty.  She states she has finished 6 of 20 physical therapy sessions she states she is doing quite well has no complaints.  She states that therapy is impressed with her progress.  Range of motion 0-110 degrees.  Assessment & Plan: Visit Diagnoses:  1. S/P total knee arthroplasty, right     Plan: Recommended continued therapy continue strengthening scar massage and extension exercises.  Follow-Up Instructions: Return in about 4 weeks (around 08/29/2017).   Ortho Exam  Patient is alert, oriented, no adenopathy, well-dressed, normal affect, normal respiratory effort. Examination patient has full extension flexion to 110 degrees she has slight keloiding of the scar if there is no redness no cellulitis no effusion no signs of infection.  Imaging: No results found. No images are attached to the encounter.  Labs: Lab Results  Component Value Date   HGBA1C 7.5 (H) 06/17/2017   HGBA1C 8.9 (H) 03/09/2017   HGBA1C 8.7 09/29/2016   ESRSEDRATE 28 06/16/2017   ESRSEDRATE 5 01/26/2017   ESRSEDRATE 27 10/14/2016   CRP 27.1 (H) 06/16/2017   CRP 17.3 (H) 01/26/2017   CRP 24.0 (H) 10/14/2016   LABURIC 5.1 01/26/2017   REPTSTATUS 10/14/2015 FINAL 10/14/2015   REPTSTATUS 10/16/2015 FINAL 10/14/2015   GRAMSTAIN  10/14/2015    MODERATE WBC PRESENT,BOTH PMN AND MONONUCLEAR FEW SQUAMOUS EPITHELIAL CELLS PRESENT ABUNDANT GRAM POSITIVE COCCI IN PAIRS IN CLUSTERS Performed at [facility]  10/14/2015    NORMAL OROPHARYNGEAL FLORA Performed at  [facility] [place] ([place] ISOLATED 06/01/2017    Orders:  No orders of the defined types were placed in this encounter.  No orders of the defined types were placed in this encounter.    Procedures: No procedures performed  Clinical Data: No additional findings.  ROS:  All other systems negative, except as noted in the HPI. Review of Systems  Objective: Vital Signs: There were no vitals taken for this visit.  Specialty Comments:  No specialty comments available.  PMFS History: Patient Active Problem List   Diagnosis Date Noted  . Plantar fasciitis, right 07/13/2017  . S/P total knee arthroplasty, right 06/29/2017  . Osteoarthrosis, localized, primary, knee, right   . Sprain of calcaneofibular ligament of right ankle 06/06/2017  . Acute left ankle pain 01/26/2017  . Osteoarthritis of right knee 01/26/2017  . Multiple allergies 10/14/2016  . Onychomycosis 10/27/2015  . Osteomyelitis of left foot ([place]) 05/29/2015  . CKD (chronic kidney disease) stage 3, GFR 30-59 ml/min (HCC) 04/27/2015  . MRSA (methicillin resistant staph aureus) culture positive 03/27/2015  . Wound infection complicating hardware ([place]) 03/27/2015  . Fatty liver 09/30/2014  . Hot flashes 07/15/2014  . Abnormal SPEP 04/17/2014  . Fracture of left leg 04/17/2014  . Cushingoid side effect of steroids ([place]) 04/17/2014  . Internal hemorrhoids   . Depression   .  Preoperative clearance 03/25/2014  . Obstructive chronic bronchitis without exacerbation ([place]) 09/18/2013  . Chest pain 04/11/2013  . Hypertensive heart disease with chronic diastolic congestive heart failure ([place])   . Solitary pulmonary nodule, on CT 02/2013 - stable over 2 years in 2015 02/20/2013  . Gout 08/20/2010  . Anemia 09/18/2009  . Morbid obesity ([place]) 06/04/2009  . Sleep apnea 04/21/2009  . Sarcoidosis of lung ([place]) 04/10/2007  . Hyperlipidemia 08/21/2006  . Essential hypertension 08/21/2006  .  GERD 08/21/2006  . Type II diabetes mellitus with neurological manifestations, uncontrolled ([place]) 08/21/2006   Past Medical History:  Diagnosis Date  . Abnormal SPEP 04/17/2014  . Acute left ankle pain 01/26/2017  . ANEMIA-UNSPECIFIED 09/18/2009  . Chronic diastolic heart failure, NYHA class 2 (HCC)    LVEDP roughly 20% by cath  . COPD (chronic obstructive pulmonary disease) ([place])   . Depression   . DIABETES MELLITUS, TYPE II 08/21/2006  . Diabetic osteomyelitis ([place]) 05/29/2015  . Fracture of 5th metatarsal    non union  . GERD 08/21/2006  . GOUT 08/20/2010  . Hx of umbilical hernia repair   . HYPERLIPIDEMIA 08/21/2006  . HYPERTENSION 08/21/2006  . Infection of wound due to methicillin resistant Staphylococcus aureus (MRSA)   . Internal hemorrhoids   . Multiple allergies 10/14/2016  . OBESITY 06/04/2009  . Onychomycosis 10/27/2015  . Osteomyelitis of left foot ([place]) 05/29/2015  . Pulmonary sarcoidosis ([place])    Followed locally by pulmonology, but also by Dr. Casper Harrison at [facility]  . Right knee pain 01/26/2017  . Vocal cord dysfunction   . Wears partial dentures     Family History  Problem Relation Age of Onset  . Diabetes Father   . Heart attack Father 67  . Coronary artery disease Father   . Heart failure Father   . COPD Mother   . Emphysema Mother   . Asthma Mother   . Heart failure Mother   . Breast cancer Mother   . Heart attack Maternal Grandfather   . Sarcoidosis Maternal Uncle   . Lung cancer Brother   . Diabetes Brother   . Colon cancer Neg Hx     Past Surgical History:  Procedure Laterality Date  . ABDOMINAL HYSTERECTOMY    . APPENDECTOMY    . BLADDER SUSPENSION  11/11/2011   Procedure: TRANSVAGINAL TAPE (TVT) PROCEDURE;  Surgeon: Olga Millers, MD;  Location: [facility];  Service: Gynecology;  Laterality: N/A;  . CARDIAC CATHETERIZATION  07/2010   LVEF 50-55% WITH VERY MILD GLOBAL HYPOKINESIA; ESSENTIALLY NORMAL CORONARY ARTERIES; NORMAL LV FUNCTION    . CAROTIDS  02/18/11   CAROTID DUPLEX; VERTEBRALS ARE PATENT WITH ANTEGRADE FLOW. ICA/CCA RATIO 1.61 ON RIGHT AND 0.75 ON LEFT  . CHOLECYSTECTOMY  1984  . CYSTOSCOPY  11/11/2011   Procedure: CYSTOSCOPY;  Surgeon: Olga Millers, MD;  Location: [name];  Service: Gynecology;  Laterality: N/A;  . DOPPLER ECHOCARDIOGRAPHY  02/12/2013   LV FUNCTION, SIZE NORMAL; MILD CONCENTRIC LVH; EST EF 55-65%; WALL MOTION NORMAL  . FRACTURE SURGERY     foot  . HERNIA REPAIR    . I&D EXTREMITY Left 06/27/2015   Procedure: Partial Excision Left Calcaneus, Place Antibiotic Beads, and Wound VAC;  Surgeon: Newt Minion, MD;  Location: [facility];  Service: Orthopedics;  Laterality: Left;  . KNEE ARTHROSCOPY     right  . LEFT AND RIGHT HEART CATHETERIZATION WITH CORONARY ANGIOGRAM N/A 04/23/2013   Procedure: LEFT AND RIGHT HEART CATHETERIZATION  WITH CORONARY ANGIOGRAM;  Surgeon: Leonie Man, MD;  Location: [facility] LAB;  Service: Cardiovascular;  Laterality: N/A;  . LEFT HEART CATH AND CORONARY ANGIOGRAPHY N/A 03/11/2017   Procedure: Left Heart Cath and Coronary Angiography;  Surgeon: Sherren Mocha, MD;  Location: [facility];  Service: Cardiovascular;  Laterality: N/A;  . LexiScan Myoview  03/09/2013   EF 50%; NORMAL MYOCARDIAL PERFUSION STUDY - breast attenuation  . METATARSAL OSTEOTOMY WITH OPEN REDUCTION INTERNAL FIXATION (ORIF) METATARSAL WITH FUSION Left 04/09/2014   Procedure: LEFT FOOT FRACTURE OPEN TREATMENT METATARSAL INCLUDES INTERNAL FIXATION EACH;  Surgeon: Lorn Junes, MD;  Location: [facility];  Service: Orthopedics;  Laterality: Left;  . NISSEN FUNDOPLICATION  8264  . Right and left CARDIAC CATHETERIZATION  04/23/2013   Angiographic normal coronaries; LVEDP 20 mmHg, PCWP 12-14 mmHg, RAP 12 mmHg.; Fick CO/CI 4.9/2.2  . TOTAL KNEE ARTHROPLASTY Right 06/29/2017   Procedure: RIGHT TOTAL KNEE ARTHROPLASTY;  Surgeon: Newt Minion, MD;  Location: [facility];  Service: Orthopedics;   Laterality: Right;  . TUBAL LIGATION     with reversal in 1994  . VENTRAL HERNIA REPAIR     Social History   Occupational History  . DISABLED    Social History Main Topics  . Smoking status: Never Smoker  . Smokeless tobacco: Never Used  . Alcohol use No  . Drug use: No  . Sexual activity: Yes    Birth control/ protection: Surgical

## 2017-08-03 ENCOUNTER — Other Ambulatory Visit: Payer: Self-pay

## 2017-08-05 ENCOUNTER — Ambulatory Visit (INDEPENDENT_AMBULATORY_CARE_PROVIDER_SITE_OTHER): Payer: BLUE CROSS/BLUE SHIELD | Admitting: Family Medicine

## 2017-08-05 ENCOUNTER — Encounter: Payer: Self-pay | Admitting: Family Medicine

## 2017-08-05 ENCOUNTER — Telehealth: Payer: Self-pay | Admitting: Pulmonary Disease

## 2017-08-05 VITALS — BP 130/80 | HR 82 | Temp 98.4°F | Ht 66.0 in | Wt 252.0 lb

## 2017-08-05 DIAGNOSIS — M25461 Effusion, right knee: Secondary | ICD-10-CM | POA: Diagnosis not present

## 2017-08-05 DIAGNOSIS — M25561 Pain in right knee: Secondary | ICD-10-CM | POA: Diagnosis not present

## 2017-08-05 DIAGNOSIS — J441 Chronic obstructive pulmonary disease with (acute) exacerbation: Secondary | ICD-10-CM

## 2017-08-05 DIAGNOSIS — R269 Unspecified abnormalities of gait and mobility: Secondary | ICD-10-CM | POA: Diagnosis not present

## 2017-08-05 DIAGNOSIS — R350 Frequency of micturition: Secondary | ICD-10-CM | POA: Diagnosis not present

## 2017-08-05 DIAGNOSIS — M25661 Stiffness of right knee, not elsewhere classified: Secondary | ICD-10-CM | POA: Diagnosis not present

## 2017-08-05 LAB — POC URINALSYSI DIPSTICK (AUTOMATED)
Bilirubin, UA: NEGATIVE
Blood, UA: NEGATIVE
Glucose, UA: NEGATIVE
Ketones, UA: NEGATIVE
Nitrite, UA: POSITIVE
Protein, UA: NEGATIVE
Spec Grav, UA: 1.025 (ref 1.010–1.025)
Urobilinogen, UA: 0.2 E.U./dL
pH, UA: 6 (ref 5.0–8.0)

## 2017-08-05 MED ORDER — ALBUTEROL SULFATE HFA 108 (90 BASE) MCG/ACT IN AERS: 1.0000 | INHALATION_SPRAY | Freq: Four times a day (QID) | RESPIRATORY_TRACT | 5 refills | Status: DC | PRN

## 2017-08-05 MED ORDER — LEVOFLOXACIN 500 MG PO TABS: 500.0000 mg | ORAL_TABLET | Freq: Every day | ORAL | 0 refills | Status: DC

## 2017-08-05 MED ORDER — PREDNISONE 50 MG PO TABS: ORAL_TABLET | ORAL | 0 refills | Status: AC

## 2017-08-05 MED ORDER — DEXLANSOPRAZOLE 60 MG PO CPDR: 60.0000 mg | DELAYED_RELEASE_CAPSULE | Freq: Every day | ORAL | 1 refills | Status: DC

## 2017-08-05 NOTE — Patient Instructions (Signed)
Start the levaquin and prednisone.  I refilled your albuterol.  Use tessalon for your cough.  Come back if symptoms worsen or do not improve.  Take care,  Dr Jerline Pain

## 2017-08-05 NOTE — Progress Notes (Addendum)
    Subjective:  [name] is a 56 y.o. female who presents today with a chief complaint of cough.   HPI:  Cough, acute issue Symptoms started about 4 days ago. Associated with sore throat and headache. Some sinus congestion. No sick contacts. No fevers. Tried some tusinex which helped some. Getting worse over the past few days. Cough is non productive. Some increased shortness of breath.   Urinary Frequency, acute issue Started 2-3 days ago. Feels similar to her last UTI. No treatments tried.  No fevers.  ROS: Per HPI  PMH: Smoking history reviewed.  Never smoker.  Objective:  Physical Exam: BP 130/80   Pulse 82   Temp 98.4 F (36.9 C) (Oral)   Ht 5\' 6"  (1.676 m)   Wt 252 lb (114.3 kg)   SpO2 98%   BMI 40.67 kg/m   Gen: NAD, resting comfortably HEENT: Right and left tympanic membrane with clear effusion bilaterally.  Oropharynx clear. CV: RRR with no murmurs appreciated Pulm: NWOB, CTAB with no crackles, wheezes, or rhonchi  Results for orders placed or performed in visit on 08/05/17 (from the past 72 hour(s))  POCT Urinalysis Dipstick (Automated)     Status: Abnormal   Collection Time: 08/05/17 11:03 AM  Result Value Ref Range   Color, UA Yellow    Clarity, UA Clear    Glucose, UA Negative    Bilirubin, UA Negative    Ketones, UA Negative    Spec Grav, UA 1.025 1.010 - 1.025   Blood, UA Negative    pH, UA 6.0 5.0 - 8.0   Protein, UA Negative    Urobilinogen, UA 0.2 0.2 or 1.0 E.U./dL   Nitrite, UA Positive    Leukocytes, UA Moderate (2+) (A) Negative   *Note: Due to a large number of results and/or encounters for the requested time period, some results have not been displayed. A complete set of results can be found in Results Review.   Assessment/Plan:  COPD exacerbation Respiratory status stable today.  Start prednisone and Levaquin.  Refilled patient's albuterol inhaler.  Recommended patient use Tessalon for her cough.  Return precautions reviewed.   Follow-up as needed.  UTI UA consistent with UTI.  Start Levaquin as above.  Send for urine culture.  Return precautions reviewed.  Follow-up as needed.  Algis Greenhouse. Jerline Pain, MD 08/05/2017 11:11 AM

## 2017-08-08 DIAGNOSIS — M25561 Pain in right knee: Secondary | ICD-10-CM | POA: Diagnosis not present

## 2017-08-08 DIAGNOSIS — M25661 Stiffness of right knee, not elsewhere classified: Secondary | ICD-10-CM | POA: Diagnosis not present

## 2017-08-08 DIAGNOSIS — M25461 Effusion, right knee: Secondary | ICD-10-CM | POA: Diagnosis not present

## 2017-08-08 DIAGNOSIS — R269 Unspecified abnormalities of gait and mobility: Secondary | ICD-10-CM | POA: Diagnosis not present

## 2017-08-08 LAB — URINE CULTURE
MICRO NUMBER:: 81202670
SPECIMEN QUALITY:: ADEQUATE

## 2017-08-10 DIAGNOSIS — R269 Unspecified abnormalities of gait and mobility: Secondary | ICD-10-CM | POA: Diagnosis not present

## 2017-08-10 DIAGNOSIS — M25561 Pain in right knee: Secondary | ICD-10-CM | POA: Diagnosis not present

## 2017-08-10 DIAGNOSIS — M25661 Stiffness of right knee, not elsewhere classified: Secondary | ICD-10-CM | POA: Diagnosis not present

## 2017-08-10 DIAGNOSIS — M25461 Effusion, right knee: Secondary | ICD-10-CM | POA: Diagnosis not present

## 2017-08-11 ENCOUNTER — Ambulatory Visit: Payer: BLUE CROSS/BLUE SHIELD | Admitting: Family Medicine

## 2017-08-12 ENCOUNTER — Other Ambulatory Visit: Payer: Self-pay

## 2017-08-12 ENCOUNTER — Telehealth: Payer: Self-pay | Admitting: Family Medicine

## 2017-08-12 ENCOUNTER — Emergency Department (HOSPITAL_COMMUNITY): Payer: BLUE CROSS/BLUE SHIELD

## 2017-08-12 ENCOUNTER — Encounter (HOSPITAL_COMMUNITY): Payer: Self-pay

## 2017-08-12 ENCOUNTER — Ambulatory Visit: Payer: BLUE CROSS/BLUE SHIELD | Admitting: Family Medicine

## 2017-08-12 ENCOUNTER — Inpatient Hospital Stay (HOSPITAL_COMMUNITY)
Admission: EM | Admit: 2017-08-12 | Discharge: 2017-08-15 | DRG: 872 | Disposition: A | Discharge status: Home / Self Care | Payer: BLUE CROSS/BLUE SHIELD | Attending: Internal Medicine | Admitting: Internal Medicine

## 2017-08-12 DIAGNOSIS — M1A4720 Other secondary chronic gout, left ankle and foot, without tophus (tophi): Secondary | ICD-10-CM | POA: Diagnosis not present

## 2017-08-12 DIAGNOSIS — Z87440 Personal history of urinary (tract) infections: Secondary | ICD-10-CM

## 2017-08-12 DIAGNOSIS — N390 Urinary tract infection, site not specified: Secondary | ICD-10-CM | POA: Diagnosis present

## 2017-08-12 DIAGNOSIS — Z6841 Body Mass Index (BMI) 40.0 and over, adult: Secondary | ICD-10-CM | POA: Diagnosis not present

## 2017-08-12 DIAGNOSIS — M25561 Pain in right knee: Secondary | ICD-10-CM | POA: Diagnosis not present

## 2017-08-12 DIAGNOSIS — Z79891 Long term (current) use of opiate analgesic: Secondary | ICD-10-CM

## 2017-08-12 DIAGNOSIS — Z794 Long term (current) use of insulin: Secondary | ICD-10-CM

## 2017-08-12 DIAGNOSIS — Z9049 Acquired absence of other specified parts of digestive tract: Secondary | ICD-10-CM

## 2017-08-12 DIAGNOSIS — K219 Gastro-esophageal reflux disease without esophagitis: Secondary | ICD-10-CM

## 2017-08-12 DIAGNOSIS — Z801 Family history of malignant neoplasm of trachea, bronchus and lung: Secondary | ICD-10-CM

## 2017-08-12 DIAGNOSIS — M109 Gout, unspecified: Secondary | ICD-10-CM | POA: Diagnosis present

## 2017-08-12 DIAGNOSIS — R05 Cough: Secondary | ICD-10-CM | POA: Diagnosis not present

## 2017-08-12 DIAGNOSIS — Z96651 Presence of right artificial knee joint: Secondary | ICD-10-CM | POA: Diagnosis present

## 2017-08-12 DIAGNOSIS — N3000 Acute cystitis without hematuria: Secondary | ICD-10-CM | POA: Diagnosis not present

## 2017-08-12 DIAGNOSIS — B961 Klebsiella pneumoniae [K. pneumoniae] as the cause of diseases classified elsewhere: Secondary | ICD-10-CM | POA: Diagnosis present

## 2017-08-12 DIAGNOSIS — N1832 Chronic kidney disease, stage 3b: Secondary | ICD-10-CM | POA: Diagnosis present

## 2017-08-12 DIAGNOSIS — I10 Essential (primary) hypertension: Secondary | ICD-10-CM

## 2017-08-12 DIAGNOSIS — R9431 Abnormal electrocardiogram [ECG] [EKG]: Secondary | ICD-10-CM | POA: Diagnosis not present

## 2017-08-12 DIAGNOSIS — Z833 Family history of diabetes mellitus: Secondary | ICD-10-CM | POA: Diagnosis not present

## 2017-08-12 DIAGNOSIS — Z881 Allergy status to other antibiotic agents status: Secondary | ICD-10-CM | POA: Diagnosis not present

## 2017-08-12 DIAGNOSIS — Z0289 Encounter for other administrative examinations: Secondary | ICD-10-CM

## 2017-08-12 DIAGNOSIS — M25661 Stiffness of right knee, not elsewhere classified: Secondary | ICD-10-CM | POA: Diagnosis not present

## 2017-08-12 DIAGNOSIS — R269 Unspecified abnormalities of gait and mobility: Secondary | ICD-10-CM | POA: Diagnosis not present

## 2017-08-12 DIAGNOSIS — Z22322 Carrier or suspected carrier of Methicillin resistant Staphylococcus aureus: Secondary | ICD-10-CM

## 2017-08-12 DIAGNOSIS — E785 Hyperlipidemia, unspecified: Secondary | ICD-10-CM | POA: Diagnosis present

## 2017-08-12 DIAGNOSIS — Z79899 Other long term (current) drug therapy: Secondary | ICD-10-CM

## 2017-08-12 DIAGNOSIS — Z8249 Family history of ischemic heart disease and other diseases of the circulatory system: Secondary | ICD-10-CM

## 2017-08-12 DIAGNOSIS — D860 Sarcoidosis of lung: Secondary | ICD-10-CM

## 2017-08-12 DIAGNOSIS — E1169 Type 2 diabetes mellitus with other specified complication: Secondary | ICD-10-CM | POA: Diagnosis present

## 2017-08-12 DIAGNOSIS — E1129 Type 2 diabetes mellitus with other diabetic kidney complication: Secondary | ICD-10-CM | POA: Diagnosis present

## 2017-08-12 DIAGNOSIS — Z90710 Acquired absence of both cervix and uterus: Secondary | ICD-10-CM

## 2017-08-12 DIAGNOSIS — M25461 Effusion, right knee: Secondary | ICD-10-CM | POA: Diagnosis not present

## 2017-08-12 DIAGNOSIS — N179 Acute kidney failure, unspecified: Secondary | ICD-10-CM | POA: Diagnosis present

## 2017-08-12 DIAGNOSIS — J441 Chronic obstructive pulmonary disease with (acute) exacerbation: Secondary | ICD-10-CM

## 2017-08-12 DIAGNOSIS — A419 Sepsis, unspecified organism: Principal | ICD-10-CM | POA: Diagnosis present

## 2017-08-12 DIAGNOSIS — E1159 Type 2 diabetes mellitus with other circulatory complications: Secondary | ICD-10-CM | POA: Diagnosis present

## 2017-08-12 DIAGNOSIS — D631 Anemia in chronic kidney disease: Secondary | ICD-10-CM | POA: Diagnosis present

## 2017-08-12 DIAGNOSIS — I152 Hypertension secondary to endocrine disorders: Secondary | ICD-10-CM | POA: Diagnosis present

## 2017-08-12 DIAGNOSIS — E7800 Pure hypercholesterolemia, unspecified: Secondary | ICD-10-CM | POA: Diagnosis not present

## 2017-08-12 DIAGNOSIS — Z888 Allergy status to other drugs, medicaments and biological substances status: Secondary | ICD-10-CM

## 2017-08-12 DIAGNOSIS — N183 Chronic kidney disease, stage 3 unspecified: Secondary | ICD-10-CM | POA: Diagnosis present

## 2017-08-12 DIAGNOSIS — I130 Hypertensive heart and chronic kidney disease with heart failure and stage 1 through stage 4 chronic kidney disease, or unspecified chronic kidney disease: Secondary | ICD-10-CM | POA: Diagnosis not present

## 2017-08-12 DIAGNOSIS — I5032 Chronic diastolic (congestive) heart failure: Secondary | ICD-10-CM | POA: Diagnosis present

## 2017-08-12 DIAGNOSIS — Z1612 Extended spectrum beta lactamase (ESBL) resistance: Secondary | ICD-10-CM | POA: Diagnosis not present

## 2017-08-12 DIAGNOSIS — E1122 Type 2 diabetes mellitus with diabetic chronic kidney disease: Secondary | ICD-10-CM | POA: Diagnosis not present

## 2017-08-12 DIAGNOSIS — Z7982 Long term (current) use of aspirin: Secondary | ICD-10-CM

## 2017-08-12 DIAGNOSIS — Z803 Family history of malignant neoplasm of breast: Secondary | ICD-10-CM

## 2017-08-12 LAB — COMPREHENSIVE METABOLIC PANEL
ALT: 19 U/L (ref 14–54)
AST: 22 U/L (ref 15–41)
Albumin: 4.1 g/dL (ref 3.5–5.0)
Alkaline Phosphatase: 90 U/L (ref 38–126)
Anion gap: 14 (ref 5–15)
BUN: 24 mg/dL — ABNORMAL HIGH (ref 6–20)
CO2: 24 mmol/L (ref 22–32)
Calcium: 9.3 mg/dL (ref 8.9–10.3)
Chloride: 96 mmol/L — ABNORMAL LOW (ref 101–111)
Creatinine, Ser: 1.23 mg/dL — ABNORMAL HIGH (ref 0.44–1.00)
GFR calc Af Amer: 55 mL/min — ABNORMAL LOW (ref >60)
GFR calc non Af Amer: 47 mL/min — ABNORMAL LOW (ref >60)
Glucose, Bld: 238 mg/dL — ABNORMAL HIGH (ref 65–99)
Potassium: 3.9 mmol/L (ref 3.5–5.1)
Sodium: 134 mmol/L — ABNORMAL LOW (ref 135–145)
Total Bilirubin: 0.4 mg/dL (ref 0.3–1.2)
Total Protein: 7.1 g/dL (ref 6.5–8.1)

## 2017-08-12 LAB — URINALYSIS, ROUTINE W REFLEX MICROSCOPIC
Bacteria, UA: NONE SEEN
Bilirubin Urine: NEGATIVE
Glucose, UA: NEGATIVE mg/dL
Hgb urine dipstick: NEGATIVE
Ketones, ur: NEGATIVE mg/dL
Nitrite: NEGATIVE
Protein, ur: NEGATIVE mg/dL
Specific Gravity, Urine: 1.016 (ref 1.005–1.030)
pH: 5 (ref 5.0–8.0)

## 2017-08-12 LAB — CBC WITH DIFFERENTIAL/PLATELET
Basophils Absolute: 0.1 10*3/uL (ref 0.0–0.1)
Basophils Relative: 0 %
Eosinophils Absolute: 0.3 10*3/uL (ref 0.0–0.7)
Eosinophils Relative: 1 %
HCT: 36.7 % (ref 36.0–46.0)
Hemoglobin: 11.5 g/dL — ABNORMAL LOW (ref 12.0–15.0)
Lymphocytes Relative: 20 %
Lymphs Abs: 4.9 10*3/uL — ABNORMAL HIGH (ref 0.7–4.0)
MCH: 26.6 pg (ref 26.0–34.0)
MCHC: 31.3 g/dL (ref 30.0–36.0)
MCV: 85 fL (ref 78.0–100.0)
Monocytes Absolute: 1.3 10*3/uL — ABNORMAL HIGH (ref 0.1–1.0)
Monocytes Relative: 5 %
Neutro Abs: 17.9 10*3/uL — ABNORMAL HIGH (ref 1.7–7.7)
Neutrophils Relative %: 74 %
Platelets: 519 10*3/uL — ABNORMAL HIGH (ref 150–400)
RBC: 4.32 MIL/uL (ref 3.87–5.11)
RDW: 15 % (ref 11.5–15.5)
WBC: 24.5 10*3/uL — ABNORMAL HIGH (ref 4.0–10.5)

## 2017-08-12 LAB — I-STAT CG4 LACTIC ACID, ED
Lactic Acid, Venous: 2.17 mmol/L (ref 0.5–1.9)
Lactic Acid, Venous: 2.56 mmol/L (ref 0.5–1.9)

## 2017-08-12 LAB — I-STAT TROPONIN, ED: Troponin i, poc: 0 ng/mL (ref 0.00–0.08)

## 2017-08-12 LAB — BRAIN NATRIURETIC PEPTIDE: B Natriuretic Peptide: 13.1 pg/mL (ref 0.0–100.0)

## 2017-08-12 MED ORDER — ALBUTEROL SULFATE (2.5 MG/3ML) 0.083% IN NEBU
5.0000 mg | INHALATION_SOLUTION | Freq: Once | RESPIRATORY_TRACT | Status: AC
  Administered 2017-08-12: 5 mg via RESPIRATORY_TRACT
  Filled 2017-08-12: qty 6

## 2017-08-12 MED ORDER — SODIUM CHLORIDE 0.9 % IV BOLUS (SEPSIS)
500.0000 mL | Freq: Once | INTRAVENOUS | Status: AC
  Administered 2017-08-12: 500 mL via INTRAVENOUS

## 2017-08-12 MED ORDER — MAGNESIUM SULFATE 2 GM/50ML IV SOLN
2.0000 g | Freq: Once | INTRAVENOUS | Status: AC
  Administered 2017-08-12: 2 g via INTRAVENOUS
  Filled 2017-08-12: qty 50

## 2017-08-12 MED ORDER — ALBUTEROL SULFATE (2.5 MG/3ML) 0.083% IN NEBU
INHALATION_SOLUTION | RESPIRATORY_TRACT | Status: AC
  Filled 2017-08-12: qty 6

## 2017-08-12 MED ORDER — IPRATROPIUM BROMIDE 0.02 % IN SOLN
0.5000 mg | Freq: Once | RESPIRATORY_TRACT | Status: AC
Start: 2017-08-12 — End: 2017-08-12
  Administered 2017-08-12: 0.5 mg via RESPIRATORY_TRACT
  Filled 2017-08-12: qty 2.5

## 2017-08-12 MED ORDER — METHYLPREDNISOLONE SODIUM SUCC 125 MG IJ SOLR
125.0000 mg | Freq: Once | INTRAMUSCULAR | Status: AC
  Administered 2017-08-12: 125 mg via INTRAVENOUS
  Filled 2017-08-12: qty 2

## 2017-08-12 MED ORDER — ALBUTEROL (5 MG/ML) CONTINUOUS INHALATION SOLN
15.0000 mg/h | INHALATION_SOLUTION | Freq: Once | RESPIRATORY_TRACT | Status: AC
  Administered 2017-08-12: 15 mg/h via RESPIRATORY_TRACT
  Filled 2017-08-12: qty 0.5

## 2017-08-12 MED ORDER — ALBUTEROL SULFATE (2.5 MG/3ML) 0.083% IN NEBU
5.0000 mg | INHALATION_SOLUTION | Freq: Once | RESPIRATORY_TRACT | Status: AC
  Administered 2017-08-12: 5 mg via RESPIRATORY_TRACT

## 2017-08-12 NOTE — ED Notes (Signed)
Pt. Ambulate to bathroom to give urine specimen.

## 2017-08-12 NOTE — ED Triage Notes (Signed)
Pt endorses shob with non productive cough x 1 week accompanied by central non radiating chest pain. Pt has copd and chf. No swelling in extremities. Auditory wheezing. VSS. 100% on RA. Pt has been using nebulizers with minimal relief.

## 2017-08-12 NOTE — Telephone Encounter (Signed)
I spoke with patient. She does not sound significantly short of breath by phone but reporting worsening shortness of breath compared to when she was seen last week despite prednisone and levaquin. She was waiting on husband to take her to ER  I expressed to her I wish that I had been told about situation so I could try to work her in at the end of the day and at a minimum get her triaged/make sure vitals stable. Will have Lea tell the team upfront so they can be aware that im willing to be flexible in cases like this and sending to Dr. Jerline Pain as well- I greatly appreciate him seeing patient last week.   With her sounding reasonable by phone and this being rather slow worsening of symptoms- I told her to go to urgent care first at [facility]. She can be transferred to ED if needed. No chest pain.   Asked her to tell urgent care provider that Im sorry as well- wish we could have made situation work out better for patient at time of visit.

## 2017-08-12 NOTE — ED Notes (Signed)
EDP aware pt still wheezing and has inc work of breathing. Suggested continuous neb. RT called to start

## 2017-08-12 NOTE — ED Notes (Signed)
Respiratory therapist at bedside.

## 2017-08-12 NOTE — ED Notes (Signed)
Date and time results received: 08/12/17 2205 (use smartphrase ".now" to insert current time)  Test: Lactic Critical Value: 2.56  Name of Provider Notified: Maryan Rued, MD  Orders Received? Or Actions Taken?: Will continue to monitor.

## 2017-08-12 NOTE — Telephone Encounter (Signed)
Patient arrived 12 minutes late to her appointment with Dr. Jerline Pain. I advised the patient of our 10 minute policy, however she said she really needed to be seen. Patient stated she "can't hardly breathe". I informed Dr. Jerline Pain of this and he advised me to tell her to go to the ER. I followed directions and she left saying she was going to the hospital.

## 2017-08-12 NOTE — ED Provider Notes (Addendum)
[facility] EMERGENCY DEPARTMENT Provider Note   CSN: [id number] Arrival date & time: 08/12/17  7416     History   Chief Complaint Chief Complaint  Patient presents with  . Shortness of Breath  . Chest Pain    HPI [name] is a 56 y.o. female.  Patient is a 56 year old female with a history of CHF, COPD, sarcoidosis, diabetes, hypertension, hyperlipidemia, anemia, chronic kidney disease presenting with worsening shortness of breath and wheezing.  Patient states she is chronically on 5 mg of prednisone but approximately 1 week ago she developed congestion, cough and wheezing.  Her doctor started her on 50 mg of prednisone, Levaquin and told her to continue doing albuterol nebs at home.  She is continued this but symptoms are worsening.  Her wheezing is not improving and she is becoming more short of breath which is worse with exertion.  She denies any localized chest discomfort, abdominal pain, fever or lower extremity swelling.  She did say she had an episode of emesis today but not ongoing vomiting.   The history is provided by the patient.  Shortness of Breath  This is a recurrent problem. The average episode lasts 1 week. The problem occurs continuously.The problem has been gradually worsening. Associated symptoms include cough, wheezing, chest pain and vomiting. Pertinent negatives include no fever, no sputum production, no abdominal pain, no leg pain and no leg swelling. The problem's precipitants include weather/humidity. She has tried oral steroids, ipratropium inhalers and beta-agonist inhalers for the symptoms. The treatment provided no relief. She has had prior hospitalizations. She has had prior ED visits. Associated medical issues include COPD and chronic lung disease.  Chest Pain   Associated symptoms include cough, shortness of breath and vomiting. Pertinent negatives include no abdominal pain, no fever, no leg pain and no sputum production.     Past Medical History:  Diagnosis Date  . Abnormal SPEP 04/17/2014  . Acute left ankle pain 01/26/2017  . ANEMIA-UNSPECIFIED 09/18/2009  . CHF (congestive heart failure) ([place])   . Chronic diastolic heart failure, NYHA class 2 (HCC)    LVEDP roughly 20% by cath  . COPD (chronic obstructive pulmonary disease) ([place])   . Depression   . DIABETES MELLITUS, TYPE II 08/21/2006  . Diabetic osteomyelitis ([place]) 05/29/2015  . Fracture of 5th metatarsal    non union  . GERD 08/21/2006  . GOUT 08/20/2010  . Hx of umbilical hernia repair   . HYPERLIPIDEMIA 08/21/2006  . HYPERTENSION 08/21/2006  . Infection of wound due to methicillin resistant Staphylococcus aureus (MRSA)   . Internal hemorrhoids   . Multiple allergies 10/14/2016  . OBESITY 06/04/2009  . Onychomycosis 10/27/2015  . Osteomyelitis of left foot ([place]) 05/29/2015  . Pulmonary sarcoidosis ([place])    Followed locally by pulmonology, but also by [facility] Casper Harrison at [facility]  . Right knee pain 01/26/2017  . Vocal cord dysfunction   . Wears partial dentures     Patient Active Problem List   Diagnosis Date Noted  . Plantar fasciitis, right 07/13/2017  . S/P total knee arthroplasty, right 06/29/2017  . Osteoarthrosis, localized, primary, knee, right   . Sprain of calcaneofibular ligament of right ankle 06/06/2017  . Acute left ankle pain 01/26/2017  . Osteoarthritis of right knee 01/26/2017  . Multiple allergies 10/14/2016  . Onychomycosis 10/27/2015  . Osteomyelitis of left foot ([place]) 05/29/2015  . CKD (chronic kidney disease) stage 3, GFR 30-59 ml/min (HCC) 04/27/2015  . MRSA (methicillin resistant  staph aureus) culture positive 03/27/2015  . Wound infection complicating hardware ([place]) 03/27/2015  . Fatty liver 09/30/2014  . Hot flashes 07/15/2014  . Abnormal SPEP 04/17/2014  . Fracture of left leg 04/17/2014  . Cushingoid side effect of steroids ([place]) 04/17/2014  . Internal hemorrhoids   . Depression   . Preoperative  clearance 03/25/2014  . Obstructive chronic bronchitis without exacerbation ([place]) 09/18/2013  . Chest pain 04/11/2013  . Hypertensive heart disease with chronic diastolic congestive heart failure ([place])   . Solitary pulmonary nodule, on CT 02/2013 - stable over 2 years in 2015 02/20/2013  . Gout 08/20/2010  . Anemia 09/18/2009  . Morbid obesity ([place]) 06/04/2009  . Sleep apnea 04/21/2009  . Sarcoidosis of lung ([place]) 04/10/2007  . Hyperlipidemia 08/21/2006  . Essential hypertension 08/21/2006  . GERD 08/21/2006  . Type II diabetes mellitus with neurological manifestations, uncontrolled ([place]) 08/21/2006    Past Surgical History:  Procedure Laterality Date  . ABDOMINAL HYSTERECTOMY    . APPENDECTOMY    . BLADDER SUSPENSION  11/11/2011   Procedure: TRANSVAGINAL TAPE (TVT) PROCEDURE;  Surgeon: Olga Millers, MD;  Location: [facility];  Service: Gynecology;  Laterality: N/A;  . CARDIAC CATHETERIZATION  07/2010   LVEF 50-55% WITH VERY MILD GLOBAL HYPOKINESIA; ESSENTIALLY NORMAL CORONARY ARTERIES; NORMAL LV FUNCTION  . CAROTIDS  02/18/11   CAROTID DUPLEX; VERTEBRALS ARE PATENT WITH ANTEGRADE FLOW. ICA/CCA RATIO 1.61 ON RIGHT AND 0.75 ON LEFT  . CHOLECYSTECTOMY  1984  . CYSTOSCOPY  11/11/2011   Procedure: CYSTOSCOPY;  Surgeon: Olga Millers, MD;  Location: [facility];  Service: Gynecology;  Laterality: N/A;  . DOPPLER ECHOCARDIOGRAPHY  02/12/2013   LV FUNCTION, SIZE NORMAL; MILD CONCENTRIC LVH; EST EF 55-65%; WALL MOTION NORMAL  . FRACTURE SURGERY     foot  . HERNIA REPAIR    . I&D EXTREMITY Left 06/27/2015   Procedure: Partial Excision Left Calcaneus, Place Antibiotic Beads, and Wound VAC;  Surgeon: Newt Minion, MD;  Location: [facility];  Service: Orthopedics;  Laterality: Left;  . KNEE ARTHROSCOPY     right  . LEFT AND RIGHT HEART CATHETERIZATION WITH CORONARY ANGIOGRAM N/A 04/23/2013   Procedure: LEFT AND RIGHT HEART CATHETERIZATION WITH CORONARY ANGIOGRAM;  Surgeon: Leonie Man, MD;  Location:  [facility];  Service: Cardiovascular;  Laterality: N/A;  . LEFT HEART CATH AND CORONARY ANGIOGRAPHY N/A 03/11/2017   Procedure: Left Heart Cath and Coronary Angiography;  Surgeon: Sherren Mocha, MD;  Location: [facility];  Service: Cardiovascular;  Laterality: N/A;  . LexiScan Myoview  03/09/2013   EF 50%; NORMAL MYOCARDIAL PERFUSION STUDY - breast attenuation  . METATARSAL OSTEOTOMY WITH OPEN REDUCTION INTERNAL FIXATION (ORIF) METATARSAL WITH FUSION Left 04/09/2014   Procedure: LEFT FOOT FRACTURE OPEN TREATMENT METATARSAL INCLUDES INTERNAL FIXATION EACH;  Surgeon: Lorn Junes, MD;  Location: [facility];  Service: Orthopedics;  Laterality: Left;  . NISSEN FUNDOPLICATION  9811  . Right and left CARDIAC CATHETERIZATION  04/23/2013   Angiographic normal coronaries; LVEDP 20 mmHg, PCWP 12-14 mmHg, RAP 12 mmHg.; Fick CO/CI 4.9/2.2  . TOTAL KNEE ARTHROPLASTY Right 06/29/2017   Procedure: RIGHT TOTAL KNEE ARTHROPLASTY;  Surgeon: Newt Minion, MD;  Location: [facility];  Service: Orthopedics;  Laterality: Right;  . TUBAL LIGATION     with reversal in 1994  . VENTRAL HERNIA REPAIR      OB History    No data available       Home Medications  Prior to Admission medications   Medication Sig Start Date End Date Taking? Authorizing Provider  albuterol (PROVENTIL HFA;VENTOLIN HFA) 108 (90 Base) MCG/ACT inhaler Inhale 1-2 puffs into the lungs every 6 (six) hours as needed for wheezing or shortness of breath. 08/05/17  Yes Vivi Barrack, MD  albuterol (PROVENTIL) (2.5 MG/3ML) 0.083% nebulizer solution Take 2.5 mg by nebulization every 6 (six) hours as needed for wheezing or shortness of breath.   Yes [provider]  allopurinol (ZYLOPRIM) 100 MG tablet Take 100 mg by mouth at bedtime.    Yes [provider]  aspirin EC 81 MG tablet Take 81 mg by mouth daily.    Yes [provider]  atorvastatin (LIPITOR) 40 MG tablet TAKE 1 TAB BY MOUTH ONCE DAILY  07/18/17  Yes [name], MD  buPROPion (WELLBUTRIN XL) 300 MG 24 hr tablet TAKE 1 TABLET (300 MG TOTAL) BY MOUTH DAILY. 12/10/16  Yes [name], MD  carvedilol (COREG) 25 MG tablet Take 1 tablet (25 mg total) by mouth 2 (two) times daily with a meal. 12/10/16  Yes Leonie Man, MD  dexlansoprazole (DEXILANT) 60 MG capsule Take 1 capsule (60 mg total) by mouth daily. 08/05/17  Yes Mannam, Praveen, MD  diclofenac sodium (VOLTAREN) 1 % GEL APPLY 2 GRAMS TOPICALLY 4 TIMES A DAY Patient taking differently: APPLY 2 GRAMS TOPICALLY 4 TIMES A DAY AS NEEDED FOR KNEE PAIN 05/03/17  Yes [name], MD  diltiazem (CARDIZEM CD) 120 MG 24 hr capsule Take 1 capsule (120 mg total) by mouth daily. 12/30/16  Yes Leonie Man, MD  estradiol (ESTRACE) 2 MG tablet Take 2 mg by mouth at bedtime.  01/24/14  Yes [provider]  fenofibrate (TRICOR) 145 MG tablet TAKE 1 TABLET BY MOUTH DAILY 07/25/17  Yes Leonie Man, MD  fluticasone [facility]) 50 MCG/ACT nasal spray Place 2 sprays into both nostrils daily. 04/29/17  Yes [name], MD  furosemide (LASIX) 40 MG tablet TAKE 1 TABLET BY MOUTH TWICE A DAY Patient taking differently: TAKE 1 TABLET (40 MG) BY MOUTH TWICE A DAY 12/06/16  Yes Leonie Man, MD  gabapentin (NEURONTIN) 100 MG capsule TAKE 1 CAPSULE BY MOUTH THREE TIMES A DAY 06/27/17  Yes Renato Shin, MD  insulin aspart (NOVOLOG FLEXPEN) 100 UNIT/ML FlexPen Inject 22-27 Units into the skin 3 (three) times daily with meals. 06/28/17  Yes Renato Shin, MD  Insulin Glargine (TOUJEO MAX SOLOSTAR) 300 UNIT/ML SOPN Inject 45 Units into the skin at bedtime. 06/28/17  Yes Renato Shin, MD  LORazepam (ATIVAN) 0.5 MG tablet Take 0.5 mg by mouth every 12 (twelve) hours as needed for anxiety.   Yes [provider]  losartan (COZAAR) 50 MG tablet TAKE 1 TABLET (50 MG TOTAL) BY MOUTH DAILY. 08/07/16  Yes [provider]  metFORMIN (GLUCOPHAGE) 1000 MG tablet Take 1  tablet (1,000 mg total) by mouth 2 (two) times daily with a meal. 06/29/17  Yes Renato Shin, MD  mometasone-formoterol [facility]) 200-5 MCG/ACT AERO Inhale 2 puffs into the lungs 2 (two) times daily. 09/25/15  Yes Mannam, Praveen, MD  nitroGLYCERIN (NITROSTAT) 0.4 MG SL tablet Place 1 tablet (0.4 mg total) under the tongue every 5 (five) minutes as needed for chest pain. Reported on 01/05/2016 03/12/17  Yes Mikhail, Velta Addison, DO  PARoxetine Mesylate (BRISDELLE) 7.5 MG CAPS Take 7.5 mg by mouth daily.   Yes [provider]  Potassium Chloride ER 20 MEQ TBCR Take 1  tablet by mouth 3 (three) times daily. 05/20/17  Yes [name], MD  predniSONE (DELTASONE) 5 MG tablet Take 5 mg by mouth daily with breakfast.   Yes [provider]  traMADol (ULTRAM) 50 MG tablet Take 1 tablet (50 mg total) by mouth every 12 (twelve) hours as needed for moderate pain or severe pain. 03/02/17  Yes [name], MD  venlafaxine XR (EFFEXOR-XR) 75 MG 24 hr capsule Take 2 capsules (150 mg total) by mouth at bedtime. 12/05/15  Yes Hongalgi, Lenis Dickinson, MD  vitamin B-12 (CYANOCOBALAMIN) 1000 MCG tablet Take 1,000 mcg by mouth daily.   Yes [provider]  BAYER MICROLET LANCETS lancets Use as instructed to check sugar 3 times daily 04/06/17   Philemon Kingdom, MD  Blood Glucose Monitoring Suppl (BAYER CONTOUR MONITOR) w/Device KIT Use daily to check sugar 04/06/17   Philemon Kingdom, MD  glucose blood (BAYER CONTOUR TEST) test strip Use as instructed to check sugar 3 times daily 04/06/17   Philemon Kingdom, MD  Insulin Pen Needle 33G X 4 MM MISC 1 each by Does not apply route 4 (four) times daily. 01/15/16   Philemon Kingdom, MD  KLOR-CON M20 20 MEQ tablet TAKE 1 TABLET BY MOUTH 3 TIMES DAILY Patient not taking: Reported on 08/12/2017 07/18/17   [name], MD  levofloxacin (LEVAQUIN) 500 MG tablet Take 1 tablet (500 mg total) by mouth daily. Patient not taking: Reported on 08/12/2017 08/05/17    Vivi Barrack, MD  orlistat (XENICAL) 120 MG capsule Take 1 capsule (120 mg total) by mouth 3 (three) times daily with meals. Patient not taking: Reported on 08/12/2017 03/02/17   [name], MD  oxyCODONE-acetaminophen (ROXICET) 5-325 MG tablet Take 1 tablet by mouth every 4 (four) hours as needed for severe pain. Patient not taking: Reported on 08/12/2017 07/01/17   Newt Minion, MD    Family History Family History  Problem Relation Age of Onset  . Diabetes Father   . Heart attack Father 10  . Coronary artery disease Father   . Heart failure Father   . COPD Mother   . Emphysema Mother   . Asthma Mother   . Heart failure Mother   . Breast cancer Mother   . Heart attack Maternal Grandfather   . Sarcoidosis Maternal Uncle   . Lung cancer Brother   . Diabetes Brother   . Colon cancer Neg Hx     Social History Social History  Substance Use Topics  . Smoking status: Never Smoker  . Smokeless tobacco: Never Used  . Alcohol use No     Allergies   Methotrexate; Vancomycin; Clindamycin/lincomycin; Chlorhexidine; Doxycycline; Lisinopril; and Teflaro [ceftaroline]   Review of Systems Review of Systems  Constitutional: Negative for fever.  Respiratory: Positive for cough, shortness of breath and wheezing. Negative for sputum production.   Cardiovascular: Positive for chest pain. Negative for leg swelling.  Gastrointestinal: Positive for vomiting. Negative for abdominal pain.  All other systems reviewed and are negative.    Physical Exam Updated Vital Signs BP (!) 175/154   Pulse 93   Temp 98.7 F (37.1 C) (Oral)   Resp (!) 24   Ht 5' 6" (1.676 m)   Wt 113.4 kg (250 lb)   SpO2 100%   BMI 40.35 kg/m   Physical Exam  Constitutional: She is oriented to person, place, and time. She appears well-developed and well-nourished. No distress.  obese  HENT:  Head: Normocephalic and atraumatic.  Mouth/Throat: Oropharynx is clear and moist.  Eyes: Pupils are equal,  round, and reactive to light. Conjunctivae and EOM are normal.  Neck: Normal range of motion. Neck supple.  Cardiovascular: Normal rate, regular rhythm and intact distal pulses.   No murmur heard. Pulmonary/Chest: Effort normal. Tachypnea noted. No respiratory distress. She has decreased breath sounds. She has wheezes. She has no rales.  Speaking in short sentences  Abdominal: Soft. She exhibits no distension. There is no tenderness. There is no rebound and no guarding.  Musculoskeletal: Normal range of motion. She exhibits no edema or tenderness.  Neurological: She is alert and oriented to person, place, and time.  Skin: Skin is warm and dry. No rash noted. No erythema.  Psychiatric: She has a normal mood and affect. Her behavior is normal.  Nursing note and vitals reviewed.    ED Treatments / Results  Labs (all labs ordered are listed, but only abnormal results are displayed) Labs Reviewed  COMPREHENSIVE METABOLIC PANEL - Abnormal; Notable for the following:       Result Value   Sodium 134 (*)    Chloride 96 (*)    Glucose, Bld 238 (*)    BUN 24 (*)    Creatinine, Ser 1.23 (*)    GFR calc non Af Amer 47 (*)    GFR calc Af Amer 55 (*)    All other components within normal limits  CBC WITH DIFFERENTIAL/PLATELET - Abnormal; Notable for the following:    WBC 24.5 (*)    Hemoglobin 11.5 (*)    Platelets 519 (*)    Neutro Abs 17.9 (*)    Lymphs Abs 4.9 (*)    Monocytes Absolute 1.3 (*)    All other components within normal limits  URINALYSIS, ROUTINE W REFLEX MICROSCOPIC - Abnormal; Notable for the following:    Leukocytes, UA MODERATE (*)    Squamous Epithelial / LPF 0-5 (*)    All other components within normal limits  I-STAT CG4 LACTIC ACID, ED - Abnormal; Notable for the following:    Lactic Acid, Venous 2.17 (*)    All other components within normal limits  I-STAT CG4 LACTIC ACID, ED - Abnormal; Notable for the following:    Lactic Acid, Venous 2.56 (*)    All other  components within normal limits  BRAIN NATRIURETIC PEPTIDE  I-STAT TROPONIN, ED    EKG  EKG Interpretation  Date/Time:  Friday August 12 2017 23:51:32 EDT Ventricular Rate:  98 PR Interval:    QRS Duration: 96 QT Interval:  385 QTC Calculation: 492 R Axis:   63 Text Interpretation:  Sinus rhythm Low voltage, precordial leads Borderline prolonged QT interval No significant change since last tracing Confirmed by Blanchie Dessert [id number]) on 08/13/2017 12:15:16 AM       Radiology Dg Chest 2 View  Result Date: 08/12/2017 CLINICAL DATA:  Nonproductive cough x1 week. EXAM: CHEST  2 VIEW COMPARISON:  03/09/2017 CXR FINDINGS: The heart size and mediastinal contours are within normal limits. Both lungs are clear. The visualized skeletal structures are unremarkable. IMPRESSION: No active cardiopulmonary disease. Electronically Signed   By: Ashley Royalty M.D.   On: 08/12/2017 19:39    Procedures Procedures (including critical care time)  Medications Ordered in ED Medications  albuterol (PROVENTIL) (2.5 MG/3ML) 0.083% nebulizer solution 5 mg (5 mg Nebulization Given 08/12/17 1912)  albuterol (PROVENTIL) (2.5 MG/3ML) 0.083% nebulizer solution 5 mg (5 mg Nebulization Given 08/12/17 2207)  ipratropium (ATROVENT) nebulizer solution 0.5 mg (0.5 mg Nebulization  Given 08/12/17 2207)  methylPREDNISolone sodium succinate (SOLU-MEDROL) 125 mg/2 mL injection 125 mg (125 mg Intravenous Given 08/12/17 2216)  magnesium sulfate IVPB 2 g 50 mL (2 g Intravenous New Bag/Given 08/12/17 2211)  sodium chloride 0.9 % bolus 500 mL (500 mLs Intravenous New Bag/Given 08/12/17 2207)  albuterol (PROVENTIL,VENTOLIN) solution continuous neb (15 mg/hr Nebulization Given 08/12/17 2259)     Initial Impression / Assessment and Plan / ED Course  I have reviewed the triage vital signs and the nursing notes.  Pertinent labs & imaging results that were available during my care of the patient were reviewed by me and considered in  my medical decision making (see chart for details).     Presenting with worsening shortness of breath which seems most likely related to COPD exacerbation in the setting of underlying sarcoidosis.  Patient has been taking antibiotics and prednisone without improvement.  She denies any infectious symptoms, swelling or symptoms related to CHF.  She is not having chest pain concerning for MI.  Chest x-ray within normal limits.  Labs are significant for leukocytosis of 24,000 however that might be related to recent steroid burst.  He is afebrile.  Initial lactate is 2.17 and repeat is 2.5 however she has been getting albuterol.  Renal function is at baseline.  Patient is wheezing on exam and tachypneic she is able to speak in short sentences.  Oxygen saturation 100% on room air.  The patient was started on magnesium, Solu-Medrol, IV fluids and albuterol/atrovent.  After treatment patient is still having wheezing.  She was started on an hour-long continuous neb with 15 mg of albuterol.  On repeat evaluation wheezing has improved some with better air movement but is still having wheezing.  Feel that patient will need admission for ongoing treatment.  CRITICAL CARE Performed by: Blanchie Dessert Total critical care time: 30 minutes Critical care time was exclusive of separately billable procedures and treating other patients. Critical care was necessary to treat or prevent imminent or life-threatening deterioration. Critical care was time spent personally by me on the following activities: development of treatment plan with patient and/or surrogate as well as nursing, discussions with consultants, evaluation of patient's response to treatment, examination of patient, obtaining history from patient or surrogate, ordering and performing treatments and interventions, ordering and review of laboratory studies, ordering and review of radiographic studies, pulse oximetry and re-evaluation of patient's  condition.  Final Clinical Impressions(s) / ED Diagnoses   Final diagnoses:  COPD exacerbation [facility])    New Prescriptions New Prescriptions   No medications on file     Blanchie Dessert, MD 08/12/17 [id number]    Blanchie Dessert, MD 08/13/17 [phone]

## 2017-08-12 NOTE — H&P (Signed)
History and Physical    [name] ZJI:[id number] DOB: 1961/04/03 DOA: 08/12/2017  Referring MD/NP/PA:   PCP: [name], MD   Patient coming from:  The patient is coming from home.  At baseline, pt is independent for most of ADL.  Chief Complaint: Shortness of breath and increased urinary frequency  HPI: [name] is a 56 y.o. female with medical history significant of sarcoidosis, COPD, hypertension, hyperlipidemia, GERD, gout, depression, anxiety, dCHF, anemia, CKD-3, obesity, who presents with shortness breath and increased urinary frequency.  Patient states that she has been having worsening shortness of breath for more than one week, which has been progressively getting worse. Patient can barely speak in full sentence. Patient was seen by PCP one weeks ago, and was started with Levaquin, also increased her chronic prednisone dose from 5 to 50 mg daily, but her symptoms have not improved. Patient has dry cough, no fever or chills. She reported to ED physician that she had chest discomfort, but denies any chest pain to me. No tenderness in the calf areas. Patient has increased urinary frequency, but no dysuria or burning on urination. She has nausea, but no vomiting, diarrhea or abdominal pain. No unilateral weakness.  ED Course: pt was found to have   Review of Systems:   General: no fevers, chills, no body weight gain, has poor appetite, has fatigue HEENT: no blurry vision, hearing changes or sore throat Respiratory: has dyspnea, coughing, wheezing CV: no chest pain, no palpitations GI: has nausea, no vomiting, abdominal pain, diarrhea, constipation GU: no dysuria, burning on urination, increased urinary frequency, hematuria  Ext: no leg edema Neuro: no unilateral weakness, numbness, or tingling, no vision change or hearing loss Skin: no rash, no skin tear. MSK: No muscle spasm, no deformity, no limitation of range of movement in spin Heme: No easy bruising.    Travel history: No recent long distant travel.  Allergy:  Allergies  Allergen Reactions  . Methotrexate Other (See Comments)    Peri-oral and buccal lesions  . Vancomycin Other (See Comments)    Nephrotoxicity   . Clindamycin/Lincomycin Nausea And Vomiting and Rash  . Chlorhexidine Itching  . Doxycycline Rash  . Lisinopril Cough  . Teflaro [Ceftaroline] Rash    Past Medical History:  Diagnosis Date  . Abnormal SPEP 04/17/2014  . Acute left ankle pain 01/26/2017  . ANEMIA-UNSPECIFIED 09/18/2009  . CHF (congestive heart failure) ([place])   . Chronic diastolic heart failure, NYHA class 2 (HCC)    LVEDP roughly 20% by cath  . COPD (chronic obstructive pulmonary disease) ([place])   . Depression   . DIABETES MELLITUS, TYPE II 08/21/2006  . Diabetic osteomyelitis ([place]) 05/29/2015  . Fracture of 5th metatarsal    non union  . GERD 08/21/2006  . GOUT 08/20/2010  . Hx of umbilical hernia repair   . HYPERLIPIDEMIA 08/21/2006  . HYPERTENSION 08/21/2006  . Infection of wound due to methicillin resistant Staphylococcus aureus (MRSA)   . Internal hemorrhoids   . Multiple allergies 10/14/2016  . OBESITY 06/04/2009  . Onychomycosis 10/27/2015  . Osteomyelitis of left foot ([place]) 05/29/2015  . Pulmonary sarcoidosis ([place])    Followed locally by pulmonology, but also by Dr. Casper Harrison at [facility]  . Right knee pain 01/26/2017  . Vocal cord dysfunction   . Wears partial dentures     Past Surgical History:  Procedure Laterality Date  . ABDOMINAL HYSTERECTOMY    . APPENDECTOMY    . BLADDER SUSPENSION  11/11/2011   Procedure: TRANSVAGINAL TAPE (TVT) PROCEDURE;  Surgeon: Olga Millers, MD;  Location: [facility];  Service: Gynecology;  Laterality: N/A;  . CARDIAC CATHETERIZATION  07/2010   LVEF 50-55% WITH VERY MILD GLOBAL HYPOKINESIA; ESSENTIALLY NORMAL CORONARY ARTERIES; NORMAL LV FUNCTION  . CAROTIDS  02/18/11   CAROTID DUPLEX; VERTEBRALS ARE PATENT WITH ANTEGRADE FLOW. ICA/CCA RATIO 1.61  ON RIGHT AND 0.75 ON LEFT  . CHOLECYSTECTOMY  1984  . CYSTOSCOPY  11/11/2011   Procedure: CYSTOSCOPY;  Surgeon: Olga Millers, MD;  Location: [facility];  Service: Gynecology;  Laterality: N/A;  . DOPPLER ECHOCARDIOGRAPHY  02/12/2013   LV FUNCTION, SIZE NORMAL; MILD CONCENTRIC LVH; EST EF 55-65%; WALL MOTION NORMAL  . FRACTURE SURGERY     foot  . HERNIA REPAIR    . I&D EXTREMITY Left 06/27/2015   Procedure: Partial Excision Left Calcaneus, Place Antibiotic Beads, and Wound VAC;  Surgeon: Newt Minion, MD;  Location: [place];  Service: Orthopedics;  Laterality: Left;  . KNEE ARTHROSCOPY     right  . LEFT AND RIGHT HEART CATHETERIZATION WITH CORONARY ANGIOGRAM N/A 04/23/2013   Procedure: LEFT AND RIGHT HEART CATHETERIZATION WITH CORONARY ANGIOGRAM;  Surgeon: Leonie Man, MD;  Location: [facility];  Service: Cardiovascular;  Laterality: N/A;  . LEFT HEART CATH AND CORONARY ANGIOGRAPHY N/A 03/11/2017   Procedure: Left Heart Cath and Coronary Angiography;  Surgeon: Sherren Mocha, MD;  Location: [place] [facility];  Service: Cardiovascular;  Laterality: N/A;  . LexiScan Myoview  03/09/2013   EF 50%; NORMAL MYOCARDIAL PERFUSION STUDY - breast attenuation  . METATARSAL OSTEOTOMY WITH OPEN REDUCTION INTERNAL FIXATION (ORIF) METATARSAL WITH FUSION Left 04/09/2014   Procedure: LEFT FOOT FRACTURE OPEN TREATMENT METATARSAL INCLUDES INTERNAL FIXATION EACH;  Surgeon: Lorn Junes, MD;  Location: [place];  Service: Orthopedics;  Laterality: Left;  . NISSEN FUNDOPLICATION  5003  . Right and left CARDIAC CATHETERIZATION  04/23/2013   Angiographic normal coronaries; LVEDP 20 mmHg, PCWP 12-14 mmHg, RAP 12 mmHg.; Fick CO/CI 4.9/2.2  . TOTAL KNEE ARTHROPLASTY Right 06/29/2017   Procedure: RIGHT TOTAL KNEE ARTHROPLASTY;  Surgeon: Newt Minion, MD;  Location: [place];  Service: Orthopedics;  Laterality: Right;  . TUBAL LIGATION     with reversal in 1994  . VENTRAL HERNIA REPAIR      Social  History:  reports that she has never smoked. She has never used smokeless tobacco. She reports that she does not drink alcohol or use drugs.  Family History:  Family History  Problem Relation Age of Onset  . Diabetes Father   . Heart attack Father 24  . Coronary artery disease Father   . Heart failure Father   . COPD Mother   . Emphysema Mother   . Asthma Mother   . Heart failure Mother   . Breast cancer Mother   . Heart attack Maternal Grandfather   . Sarcoidosis Maternal Uncle   . Lung cancer Brother   . Diabetes Brother   . Colon cancer Neg Hx      Prior to Admission medications   Medication Sig Start Date End Date Taking? Authorizing Provider  albuterol (PROVENTIL HFA;VENTOLIN HFA) 108 (90 Base) MCG/ACT inhaler Inhale 1-2 puffs into the lungs every 6 (six) hours as needed for wheezing or shortness of breath. 08/05/17  Yes Vivi Barrack, MD  albuterol (PROVENTIL) (2.5 MG/3ML) 0.083% nebulizer solution Take 2.5 mg by nebulization every 6 (six) hours as needed for wheezing  or shortness of breath.   Yes [provider]  allopurinol (ZYLOPRIM) 100 MG tablet Take 100 mg by mouth at bedtime.    Yes [provider]  aspirin EC 81 MG tablet Take 81 mg by mouth daily.    Yes [provider]  atorvastatin (LIPITOR) 40 MG tablet TAKE 1 TAB BY MOUTH ONCE DAILY 07/18/17  Yes [name], MD  buPROPion (WELLBUTRIN XL) 300 MG 24 hr tablet TAKE 1 TABLET (300 MG TOTAL) BY MOUTH DAILY. 12/10/16  Yes [name], MD  carvedilol (COREG) 25 MG tablet Take 1 tablet (25 mg total) by mouth 2 (two) times daily with a meal. 12/10/16  Yes Leonie Man, MD  dexlansoprazole (DEXILANT) 60 MG capsule Take 1 capsule (60 mg total) by mouth daily. 08/05/17  Yes Mannam, Praveen, MD  diclofenac sodium (VOLTAREN) 1 % GEL APPLY 2 GRAMS TOPICALLY 4 TIMES A DAY Patient taking differently: APPLY 2 GRAMS TOPICALLY 4 TIMES A DAY AS NEEDED FOR KNEE PAIN 05/03/17  Yes [name],  MD  diltiazem (CARDIZEM CD) 120 MG 24 hr capsule Take 1 capsule (120 mg total) by mouth daily. 12/30/16  Yes Leonie Man, MD  estradiol (ESTRACE) 2 MG tablet Take 2 mg by mouth at bedtime.  01/24/14  Yes [provider]  fenofibrate (TRICOR) 145 MG tablet TAKE 1 TABLET BY MOUTH DAILY 07/25/17  Yes Leonie Man, MD  fluticasone [facility]) 50 MCG/ACT nasal spray Place 2 sprays into both nostrils daily. 04/29/17  Yes [name], MD  furosemide (LASIX) 40 MG tablet TAKE 1 TABLET BY MOUTH TWICE A DAY Patient taking differently: TAKE 1 TABLET (40 MG) BY MOUTH TWICE A DAY 12/06/16  Yes Leonie Man, MD  gabapentin (NEURONTIN) 100 MG capsule TAKE 1 CAPSULE BY MOUTH THREE TIMES A DAY 06/27/17  Yes Renato Shin, MD  insulin aspart (NOVOLOG FLEXPEN) 100 UNIT/ML FlexPen Inject 22-27 Units into the skin 3 (three) times daily with meals. 06/28/17  Yes Renato Shin, MD  Insulin Glargine (TOUJEO MAX SOLOSTAR) 300 UNIT/ML SOPN Inject 45 Units into the skin at bedtime. 06/28/17  Yes Renato Shin, MD  LORazepam (ATIVAN) 0.5 MG tablet Take 0.5 mg by mouth every 12 (twelve) hours as needed for anxiety.   Yes [provider]  losartan (COZAAR) 50 MG tablet TAKE 1 TABLET (50 MG TOTAL) BY MOUTH DAILY. 08/07/16  Yes [provider]  metFORMIN (GLUCOPHAGE) 1000 MG tablet Take 1 tablet (1,000 mg total) by mouth 2 (two) times daily with a meal. 06/29/17  Yes Renato Shin, MD  mometasone-formoterol [facility]) 200-5 MCG/ACT AERO Inhale 2 puffs into the lungs 2 (two) times daily. 09/25/15  Yes Mannam, Praveen, MD  nitroGLYCERIN (NITROSTAT) 0.4 MG SL tablet Place 1 tablet (0.4 mg total) under the tongue every 5 (five) minutes as needed for chest pain. Reported on 01/05/2016 03/12/17  Yes Mikhail, Velta Addison, DO  PARoxetine Mesylate (BRISDELLE) 7.5 MG CAPS Take 7.5 mg by mouth daily.   Yes [provider]  Potassium Chloride ER 20 MEQ TBCR Take 1 tablet by mouth 3 (three) times daily.  05/20/17  Yes [name], MD  predniSONE (DELTASONE) 5 MG tablet Take 5 mg by mouth daily with breakfast.   Yes [provider]  traMADol (ULTRAM) 50 MG tablet Take 1 tablet (50 mg total) by mouth every 12 (twelve) hours as needed for moderate pain or severe pain. 03/02/17  Yes [name], MD  venlafaxine XR [facility])  75 MG 24 hr capsule Take 2 capsules (150 mg total) by mouth at bedtime. 12/05/15  Yes Hongalgi, Lenis Dickinson, MD  vitamin B-12 (CYANOCOBALAMIN) 1000 MCG tablet Take 1,000 mcg by mouth daily.   Yes [provider]  BAYER MICROLET LANCETS lancets Use as instructed to check sugar 3 times daily 04/06/17   Philemon Kingdom, MD  Blood Glucose Monitoring Suppl (BAYER CONTOUR MONITOR) w/Device KIT Use daily to check sugar 04/06/17   Philemon Kingdom, MD  glucose blood (BAYER CONTOUR TEST) test strip Use as instructed to check sugar 3 times daily 04/06/17   Philemon Kingdom, MD  Insulin Pen Needle 33G X 4 MM MISC 1 each by Does not apply route 4 (four) times daily. 01/15/16   Philemon Kingdom, MD  KLOR-CON M20 20 MEQ tablet TAKE 1 TABLET BY MOUTH 3 TIMES DAILY Patient not taking: Reported on 08/12/2017 07/18/17   [name], MD  levofloxacin (LEVAQUIN) 500 MG tablet Take 1 tablet (500 mg total) by mouth daily. Patient not taking: Reported on 08/12/2017 08/05/17   Vivi Barrack, MD  orlistat (XENICAL) 120 MG capsule Take 1 capsule (120 mg total) by mouth 3 (three) times daily with meals. Patient not taking: Reported on 08/12/2017 03/02/17   [name], MD  oxyCODONE-acetaminophen (ROXICET) 5-325 MG tablet Take 1 tablet by mouth every 4 (four) hours as needed for severe pain. Patient not taking: Reported on 08/12/2017 07/01/17   Newt Minion, MD    Physical Exam: Vitals:   08/12/17 2301 08/12/17 2345 08/13/17 0030 08/13/17 0036  BP:  (!) 160/113 (!) 157/63   Pulse:  90 93   Resp:  18 (!) 24   Temp:      TempSrc:      SpO2: 100% 100% 97% 100%    Weight:      Height:       General: Not in acute distress HEENT:       Eyes: PERRL, EOMI, no scleral icterus.       ENT: No discharge from the ears and nose, no pharynx injection, no tonsillar enlargement.        Neck: No JVD, no bruit, no mass felt. Heme: No neck lymph node enlargement. Cardiac: S1/S2, RRR, No murmurs, No gallops or rubs. Respiratory: Decreased air movement bilaterally, has diffused wheezing. GI: Soft, nondistended, nontender, no rebound pain, no organomegaly, BS present. GU: No hematuria Ext: No pitting leg edema bilaterally. 2+DP/PT pulse bilaterally. Musculoskeletal: No joint deformities, No joint redness or warmth, no limitation of ROM in spin. Skin: No rashes.  Neuro: Alert, oriented X3, cranial nerves II-XII grossly intact, moves all extremities normally.  Psych: Patient is not psychotic, no suicidal or hemocidal ideation.  Labs on Admission: I have personally reviewed following labs and imaging studies  CBC:  Recent Labs Lab 08/12/17 1901  WBC 24.5*  NEUTROABS 17.9*  HGB 11.5*  HCT 36.7  MCV 85.0  PLT 374*   Basic Metabolic Panel:  Recent Labs Lab 08/12/17 1901  NA 134*  K 3.9  CL 96*  CO2 24  GLUCOSE 238*  BUN 24*  CREATININE 1.23*  CALCIUM 9.3   GFR: Estimated Creatinine Clearance: 63.7 mL/min (A) (by C-G formula based on SCr of 1.23 mg/dL (H)). Liver Function Tests:  Recent Labs Lab 08/12/17 1901  AST 22  ALT 19  ALKPHOS 90  BILITOT 0.4  PROT 7.1  ALBUMIN 4.1   No results for input(s): LIPASE, AMYLASE in the last 168 hours. No results  for input(s): AMMONIA in the last 168 hours. Coagulation Profile: No results for input(s): INR, PROTIME in the last 168 hours. Cardiac Enzymes: No results for input(s): CKTOTAL, CKMB, CKMBINDEX, TROPONINI in the last 168 hours. BNP (last 3 results) No results for input(s): PROBNP in the last 8760 hours. HbA1C: No results for input(s): HGBA1C in the last 72 hours. CBG: No results for  input(s): GLUCAP in the last 168 hours. Lipid Profile: No results for input(s): CHOL, HDL, LDLCALC, TRIG, CHOLHDL, LDLDIRECT in the last 72 hours. Thyroid Function Tests: No results for input(s): TSH, T4TOTAL, FREET4, T3FREE, THYROIDAB in the last 72 hours. Anemia Panel: No results for input(s): VITAMINB12, FOLATE, FERRITIN, TIBC, IRON, RETICCTPCT in the last 72 hours. Urine analysis:    Component Value Date/Time   COLORURINE YELLOW 08/12/2017 2115   APPEARANCEUR CLEAR 08/12/2017 2115   LABSPEC 1.016 08/12/2017 2115   PHURINE 5.0 08/12/2017 2115   GLUCOSEU NEGATIVE 08/12/2017 2115   HGBUR NEGATIVE 08/12/2017 2115   HGBUR negative 10/20/2010 0814   BILIRUBINUR NEGATIVE 08/12/2017 2115   BILIRUBINUR Negative 08/05/2017 1103   KETONESUR NEGATIVE 08/12/2017 2115   PROTEINUR NEGATIVE 08/12/2017 2115   UROBILINOGEN 0.2 08/05/2017 1103   UROBILINOGEN 0.2 04/27/2015 1226   NITRITE NEGATIVE 08/12/2017 2115   LEUKOCYTESUR MODERATE (A) 08/12/2017 2115   Sepsis Labs: '@LABRCNTIP'$ (procalcitonin:4,lacticidven:4) ) Recent Results (from the past 240 hour(s))  Urine Culture     Status: Abnormal   Collection Time: 08/05/17 11:16 AM  Result Value Ref Range Status   MICRO NUMBER: [id number]  Final   SPECIMEN QUALITY: ADEQUATE  Final   Sample Source URINE  Final   STATUS: FINAL  Final   ISOLATE 1: ESBL Klebsiella pneumoniae (A)  Final      Susceptibility   Esbl klebsiella pneumoniae - URINE CULTURE, REFLEX    AMOX/CLAVULANIC 16 Intermediate     AMPICILLIN* >=32 Resistant      * Extended spectrum beta-lactamase (ESBL) producingorganisms demonstrate decreased activity withpenicillins, cephalosporins and aztreonam.    AMPICILLIN/SULBACTAM >=32 Resistant     CEFAZOLIN* >=64 Resistant      * Extended spectrum beta-lactamase (ESBL) producingorganisms demonstrate decreased activity withpenicillins, cephalosporins and aztreonam.For uncomplicated UTI caused by E. coli,K. pneumoniae or P. mirabilis:  Cefazolin issusceptible if MIC <32 mcg/mL and predictssusceptible to the oral agents cefaclor, cefdinir,cefpodoxime, cefprozil, cefuroxime, cephalexinand loracarbef.    CEFEPIME  Resistant     CEFTRIAXONE 16 Resistant     CIPROFLOXACIN 2 Intermediate     LEVOFLOXACIN 1 Sensitive     ERTAPENEM <=0.5 Sensitive     GENTAMICIN <=1 Sensitive     IMIPENEM <=0.25 Sensitive     NITROFURANTOIN 64 Intermediate     PIP/TAZO 16 Sensitive     TOBRAMYCIN 8 Intermediate     TRIMETH/SULFA* >=320 Resistant      * Extended spectrum beta-lactamase (ESBL) producingorganisms demonstrate decreased activity withpenicillins, cephalosporins and aztreonam.For uncomplicated UTI caused by E. coli,K. pneumoniae or P. mirabilis: Cefazolin issusceptible if MIC <32 mcg/mL and predictssusceptible to the oral agents cefaclor, cefdinir,cefpodoxime, cefprozil, cefuroxime, cephalexinand loracarbef.Legend:S = Susceptible  I = IntermediateR = Resistant  NS = Not susceptible* = Not tested  NR = Not reported**NN = See antimicrobic comments     Radiological Exams on Admission: Dg Chest 2 View  Result Date: 08/12/2017 CLINICAL DATA:  Nonproductive cough x1 week. EXAM: CHEST  2 VIEW COMPARISON:  03/09/2017 CXR FINDINGS: The heart size and mediastinal contours are within normal limits. Both lungs are clear. The visualized skeletal structures are  unremarkable. IMPRESSION: No active cardiopulmonary disease. Electronically Signed   By: Ashley Royalty M.D.   On: 08/12/2017 19:39     EKG: Not done in ED, will get one.   Assessment/Plan Principal Problem:   COPD with acute exacerbation (HCC) Active Problems:   Sarcoidosis of lung (HCC)   Hyperlipidemia   Gout   Essential hypertension   GERD   Type II diabetes mellitus with renal manifestations (HCC)   CKD (chronic kidney disease) stage 3, GFR 30-59 ml/min (HCC)   UTI (urinary tract infection)   Sepsis (HCC)   Chronic diastolic CHF (congestive heart failure) (HCC)   COPD  exacerbation in the setting of sarcoidosis of lung, and sepsis: Patient's cough, shortness breath, wheezing on auscultation, consistent with COPD exacerbation. No infiltration on chest x-ray. Patient has no leg edema or JVD. BNP is 13.1, does not seem to have CHF exacerbation. Patient meets criteria for sepsis with leukocytosis, tachycardia, tachypnea. Lactic acid elevated at 2.17--> 2.56. Currently hemodynamically stable. Patient has been treated by primary care doctor with increased the dose of prednisone 50 mg daily and Levaquin for 1 week, without significant improvement of symptoms. [name] followed by by [facility] specialist of sarcoidosis. Calcium 9.3 today.  -will admit patient to telemetry bed  -Check ABG -Nebulizers: scheduled Duoneb and prn albuterol -Solu-Medrol 60 mg IV tid -IV azithromycin (pt will be also on meropenem for UTI) -Mucinex for cough  -Incentive spirometry -Urine S. pneumococcal antigen -Follow up blood culture x2, sputum culture, respiratory virus panel, Flu pcr -will get Procalcitonin and trend lactic acid levels per sepsis protocol. -IVF: 1.5 of NS bolus in ED, followed by 100 cc/h (patient has a congestive heart failure, limiting aggressive IV fluids treatment).  Chronic diastolic CHF: 2-D echo on 09/02/14 showed EF of 55-60% with grade 1 diastolic dysfunction. Patient does not have leg edema or JVD. BNP is 13.1. CHF is compensated on admission -Hold her Lasix due to sepsis -Continue Coreg and aspirin.  Sarcoidosis of lung: -See above treatment  UTI (urinary tract infection): Patient has been on Levaquin for 1 week, but still has increased urinary frequency. Today urinalysis is positive with large amount of leukocyte. Patient had positive urine culture for ESBL Klebsiella pneumonia on 10/26. -will start meropenem -Follow-up blood culture and urine culture.  HLD: -lipitor and Tricor  Gout: -continue home allopurinol  Essential hypertension: -Continue Cardizem,  Coreg, Cozaar -IV hydralazine when necessary  GERD: -Protonix  Type II diabetes mellitus with renal manifestations ([place]): Last A1c 7.5, not well controled. Patient is taking glargine insulin, NovoLog, metformin at home -will decrease glargine dose from  45-30 unit -SSI -Check A1c  CKD (chronic kidney disease) stage 3: Stable. Baseline creatinine 1.1-1.3, her creatinine is 1.23, BUN 24. -Follow up renal function by BMP   DVT ppx: SQ Lovenox Code Status: Full code Family Communication: None at bed side.  Disposition Plan:  Anticipate discharge back to previous home environment Consults called:  none Admission status:   Inpatient/tele    Date of Service 08/13/2017    Ivor Costa [facility] Pager [phone]  If 7PM-7AM, please contact night-coverage [email or web address] Password [email or web address] 08/13/2017, 12:51 AM

## 2017-08-13 DIAGNOSIS — N183 Chronic kidney disease, stage 3 (moderate): Secondary | ICD-10-CM | POA: Diagnosis present

## 2017-08-13 DIAGNOSIS — M1A4720 Other secondary chronic gout, left ankle and foot, without tophus (tophi): Secondary | ICD-10-CM

## 2017-08-13 DIAGNOSIS — Z90710 Acquired absence of both cervix and uterus: Secondary | ICD-10-CM | POA: Diagnosis not present

## 2017-08-13 DIAGNOSIS — M109 Gout, unspecified: Secondary | ICD-10-CM | POA: Diagnosis present

## 2017-08-13 DIAGNOSIS — A419 Sepsis, unspecified organism: Secondary | ICD-10-CM | POA: Diagnosis present

## 2017-08-13 DIAGNOSIS — Z1612 Extended spectrum beta lactamase (ESBL) resistance: Secondary | ICD-10-CM | POA: Diagnosis present

## 2017-08-13 DIAGNOSIS — I5032 Chronic diastolic (congestive) heart failure: Secondary | ICD-10-CM | POA: Diagnosis present

## 2017-08-13 DIAGNOSIS — Z888 Allergy status to other drugs, medicaments and biological substances status: Secondary | ICD-10-CM | POA: Diagnosis not present

## 2017-08-13 DIAGNOSIS — B961 Klebsiella pneumoniae [K. pneumoniae] as the cause of diseases classified elsewhere: Secondary | ICD-10-CM | POA: Diagnosis present

## 2017-08-13 DIAGNOSIS — Z6841 Body Mass Index (BMI) 40.0 and over, adult: Secondary | ICD-10-CM | POA: Diagnosis not present

## 2017-08-13 DIAGNOSIS — E1122 Type 2 diabetes mellitus with diabetic chronic kidney disease: Secondary | ICD-10-CM | POA: Diagnosis present

## 2017-08-13 DIAGNOSIS — E785 Hyperlipidemia, unspecified: Secondary | ICD-10-CM | POA: Diagnosis present

## 2017-08-13 DIAGNOSIS — K219 Gastro-esophageal reflux disease without esophagitis: Secondary | ICD-10-CM | POA: Diagnosis present

## 2017-08-13 DIAGNOSIS — D860 Sarcoidosis of lung: Secondary | ICD-10-CM | POA: Diagnosis present

## 2017-08-13 DIAGNOSIS — Z9049 Acquired absence of other specified parts of digestive tract: Secondary | ICD-10-CM | POA: Diagnosis not present

## 2017-08-13 DIAGNOSIS — I10 Essential (primary) hypertension: Secondary | ICD-10-CM | POA: Diagnosis not present

## 2017-08-13 DIAGNOSIS — I130 Hypertensive heart and chronic kidney disease with heart failure and stage 1 through stage 4 chronic kidney disease, or unspecified chronic kidney disease: Secondary | ICD-10-CM | POA: Diagnosis present

## 2017-08-13 DIAGNOSIS — J441 Chronic obstructive pulmonary disease with (acute) exacerbation: Secondary | ICD-10-CM | POA: Diagnosis present

## 2017-08-13 DIAGNOSIS — Z96651 Presence of right artificial knee joint: Secondary | ICD-10-CM | POA: Diagnosis present

## 2017-08-13 DIAGNOSIS — Z881 Allergy status to other antibiotic agents status: Secondary | ICD-10-CM | POA: Diagnosis not present

## 2017-08-13 DIAGNOSIS — N390 Urinary tract infection, site not specified: Secondary | ICD-10-CM | POA: Diagnosis present

## 2017-08-13 DIAGNOSIS — D631 Anemia in chronic kidney disease: Secondary | ICD-10-CM | POA: Diagnosis present

## 2017-08-13 DIAGNOSIS — Z833 Family history of diabetes mellitus: Secondary | ICD-10-CM | POA: Diagnosis not present

## 2017-08-13 DIAGNOSIS — Z22322 Carrier or suspected carrier of Methicillin resistant Staphylococcus aureus: Secondary | ICD-10-CM | POA: Diagnosis not present

## 2017-08-13 DIAGNOSIS — N3000 Acute cystitis without hematuria: Secondary | ICD-10-CM

## 2017-08-13 DIAGNOSIS — N179 Acute kidney failure, unspecified: Secondary | ICD-10-CM | POA: Diagnosis present

## 2017-08-13 LAB — LACTIC ACID, PLASMA
Lactic Acid, Venous: 1.5 mmol/L (ref 0.5–1.9)
Lactic Acid, Venous: 2.6 mmol/L (ref 0.5–1.9)

## 2017-08-13 LAB — RESPIRATORY PANEL BY PCR

## 2017-08-13 LAB — GLUCOSE, CAPILLARY
Glucose-Capillary: 263 mg/dL — ABNORMAL HIGH (ref 65–99)
Glucose-Capillary: 413 mg/dL — ABNORMAL HIGH (ref 65–99)
Glucose-Capillary: 346 mg/dL — ABNORMAL HIGH (ref 65–99)
Glucose-Capillary: 260 mg/dL — ABNORMAL HIGH (ref 65–99)
Glucose-Capillary: 441 mg/dL — ABNORMAL HIGH (ref 65–99)

## 2017-08-13 LAB — HIV ANTIBODY (ROUTINE TESTING W REFLEX): HIV Screen 4th Generation wRfx: NONREACTIVE

## 2017-08-13 LAB — I-STAT ARTERIAL BLOOD GAS, ED
Acid-Base Excess: 4 mmol/L — ABNORMAL HIGH (ref 0.0–2.0)
Bicarbonate: 26.5 mmol/L (ref 20.0–28.0)
O2 Saturation: 99 %
Patient temperature: 98.6
TCO2: 27 mmol/L (ref 22–32)
pCO2 arterial: 31.2 mmHg — ABNORMAL LOW (ref 32.0–48.0)
pH, Arterial: 7.536 — ABNORMAL HIGH (ref 7.350–7.450)
pO2, Arterial: 136 mmHg — ABNORMAL HIGH (ref 83.0–108.0)

## 2017-08-13 LAB — PROCALCITONIN: Procalcitonin: <0.1 ng/mL

## 2017-08-13 LAB — HEMOGLOBIN A1C
Hgb A1c MFr Bld: 7.8 % — ABNORMAL HIGH (ref 4.8–5.6)
Mean Plasma Glucose: 177.16 mg/dL

## 2017-08-13 LAB — INFLUENZA PANEL BY PCR (TYPE A & B)
Influenza A By PCR: NEGATIVE
Influenza B By PCR: NEGATIVE

## 2017-08-13 LAB — STREP PNEUMONIAE URINARY ANTIGEN: Strep Pneumo Urinary Antigen: NEGATIVE

## 2017-08-13 LAB — MRSA PCR SCREENING: MRSA by PCR: POSITIVE — AB

## 2017-08-13 MED ORDER — LORAZEPAM 0.5 MG PO TABS: 0.5000 mg | ORAL_TABLET | Freq: Two times a day (BID) | ORAL | Status: DC | PRN

## 2017-08-13 MED ORDER — ENOXAPARIN SODIUM 40 MG/0.4ML ~~LOC~~ SOLN
40.0000 mg | SUBCUTANEOUS | Status: DC
  Administered 2017-08-13 – 2017-08-15 (×3): 40 mg via SUBCUTANEOUS
  Filled 2017-08-13 (×3): qty 0.4

## 2017-08-13 MED ORDER — DILTIAZEM HCL ER COATED BEADS 120 MG PO CP24
120.0000 mg | ORAL_CAPSULE | Freq: Every day | ORAL | Status: DC
  Administered 2017-08-13 – 2017-08-15 (×3): 120 mg via ORAL
  Filled 2017-08-13 (×3): qty 1

## 2017-08-13 MED ORDER — SODIUM CHLORIDE 0.9 % IV BOLUS (SEPSIS)
1000.0000 mL | Freq: Once | INTRAVENOUS | Status: AC
  Administered 2017-08-13: 1000 mL via INTRAVENOUS

## 2017-08-13 MED ORDER — VENLAFAXINE HCL ER 150 MG PO CP24
150.0000 mg | ORAL_CAPSULE | Freq: Every day | ORAL | Status: DC
  Administered 2017-08-13 – 2017-08-14 (×3): 150 mg via ORAL
  Filled 2017-08-13: qty 2
  Filled 2017-08-13 (×2): qty 1
  Filled 2017-08-13: qty 2
  Filled 2017-08-13 (×2): qty 1

## 2017-08-13 MED ORDER — INSULIN ASPART 100 UNIT/ML ~~LOC~~ SOLN: 0.0000 [IU] | Freq: Three times a day (TID) | SUBCUTANEOUS | Status: DC

## 2017-08-13 MED ORDER — ZOLPIDEM TARTRATE 5 MG PO TABS: 5.0000 mg | ORAL_TABLET | Freq: Every evening | ORAL | Status: DC | PRN

## 2017-08-13 MED ORDER — METHYLPREDNISOLONE SODIUM SUCC 125 MG IJ SOLR
80.0000 mg | Freq: Three times a day (TID) | INTRAMUSCULAR | Status: DC
  Administered 2017-08-13 – 2017-08-15 (×7): 80 mg via INTRAVENOUS
  Filled 2017-08-13 (×7): qty 2

## 2017-08-13 MED ORDER — ASPIRIN EC 81 MG PO TBEC
81.0000 mg | DELAYED_RELEASE_TABLET | Freq: Every day | ORAL | Status: DC
  Administered 2017-08-13 – 2017-08-15 (×3): 81 mg via ORAL
  Filled 2017-08-13 (×3): qty 1

## 2017-08-13 MED ORDER — VITAMIN B-12 1000 MCG PO TABS
1000.0000 ug | ORAL_TABLET | Freq: Every day | ORAL | Status: DC
  Administered 2017-08-13 – 2017-08-15 (×3): 1000 ug via ORAL
  Filled 2017-08-13 (×3): qty 1

## 2017-08-13 MED ORDER — FLUTICASONE PROPIONATE 50 MCG/ACT NA SUSP
2.0000 | Freq: Every day | NASAL | Status: DC
  Administered 2017-08-13 – 2017-08-15 (×2): 2 via NASAL
  Filled 2017-08-13: qty 16

## 2017-08-13 MED ORDER — SODIUM CHLORIDE 0.9 % IV SOLN
1.0000 g | Freq: Three times a day (TID) | INTRAVENOUS | Status: DC
  Administered 2017-08-13 – 2017-08-15 (×8): 1 g via INTRAVENOUS
  Filled 2017-08-13 (×10): qty 1

## 2017-08-13 MED ORDER — TRAMADOL HCL 50 MG PO TABS
50.0000 mg | ORAL_TABLET | Freq: Two times a day (BID) | ORAL | Status: DC | PRN
  Administered 2017-08-13 – 2017-08-14 (×2): 50 mg via ORAL
  Filled 2017-08-13 (×2): qty 1

## 2017-08-13 MED ORDER — INSULIN GLARGINE 100 UNIT/ML ~~LOC~~ SOLN
30.0000 [IU] | Freq: Once | SUBCUTANEOUS | Status: AC
  Administered 2017-08-13: 30 [IU] via SUBCUTANEOUS
  Filled 2017-08-13 (×2): qty 0.3

## 2017-08-13 MED ORDER — PAROXETINE MESYLATE 7.5 MG PO CAPS: 7.5000 mg | ORAL_CAPSULE | Freq: Every day | ORAL | Status: DC

## 2017-08-13 MED ORDER — INSULIN GLARGINE 100 UNIT/ML ~~LOC~~ SOLN
30.0000 [IU] | Freq: Every day | SUBCUTANEOUS | Status: DC
  Administered 2017-08-13 – 2017-08-14 (×3): 30 [IU] via SUBCUTANEOUS
  Filled 2017-08-13 (×4): qty 0.3

## 2017-08-13 MED ORDER — IPRATROPIUM-ALBUTEROL 0.5-2.5 (3) MG/3ML IN SOLN
3.0000 mL | RESPIRATORY_TRACT | Status: DC
  Administered 2017-08-13 (×4): 3 mL via RESPIRATORY_TRACT
  Filled 2017-08-13 (×4): qty 3

## 2017-08-13 MED ORDER — INSULIN ASPART 100 UNIT/ML ~~LOC~~ SOLN
0.0000 [IU] | Freq: Three times a day (TID) | SUBCUTANEOUS | Status: DC
  Administered 2017-08-13: 11 [IU] via SUBCUTANEOUS
  Administered 2017-08-13: 20 [IU] via SUBCUTANEOUS
  Administered 2017-08-13 – 2017-08-14 (×2): 15 [IU] via SUBCUTANEOUS
  Administered 2017-08-14 (×2): 11 [IU] via SUBCUTANEOUS
  Administered 2017-08-15 (×2): 15 [IU] via SUBCUTANEOUS
  Administered 2017-08-15: 20 [IU] via SUBCUTANEOUS

## 2017-08-13 MED ORDER — LOSARTAN POTASSIUM 50 MG PO TABS
50.0000 mg | ORAL_TABLET | Freq: Every day | ORAL | Status: DC
  Administered 2017-08-13 – 2017-08-15 (×3): 50 mg via ORAL
  Filled 2017-08-13 (×3): qty 1

## 2017-08-13 MED ORDER — BUPROPION HCL ER (XL) 150 MG PO TB24
300.0000 mg | ORAL_TABLET | Freq: Every day | ORAL | Status: DC
  Administered 2017-08-13 – 2017-08-15 (×3): 300 mg via ORAL
  Filled 2017-08-13 (×3): qty 2

## 2017-08-13 MED ORDER — ALBUTEROL SULFATE (2.5 MG/3ML) 0.083% IN NEBU: 5.0000 mg | INHALATION_SOLUTION | RESPIRATORY_TRACT | Status: DC | PRN

## 2017-08-13 MED ORDER — PANTOPRAZOLE SODIUM 40 MG PO TBEC
40.0000 mg | DELAYED_RELEASE_TABLET | Freq: Every day | ORAL | Status: DC
  Administered 2017-08-13 – 2017-08-15 (×3): 40 mg via ORAL
  Filled 2017-08-13 (×3): qty 1

## 2017-08-13 MED ORDER — CARVEDILOL 12.5 MG PO TABS
25.0000 mg | ORAL_TABLET | Freq: Two times a day (BID) | ORAL | Status: DC
  Administered 2017-08-13 – 2017-08-15 (×6): 25 mg via ORAL
  Filled 2017-08-13 (×7): qty 2

## 2017-08-13 MED ORDER — MUPIROCIN 2 % EX OINT
1.0000 "application " | TOPICAL_OINTMENT | Freq: Two times a day (BID) | CUTANEOUS | Status: DC
  Administered 2017-08-13 – 2017-08-15 (×5): 1 via NASAL
  Filled 2017-08-13: qty 22

## 2017-08-13 MED ORDER — HYDRALAZINE HCL 20 MG/ML IJ SOLN: 5.0000 mg | INTRAMUSCULAR | Status: DC | PRN

## 2017-08-13 MED ORDER — ACETAMINOPHEN 325 MG PO TABS
650.0000 mg | ORAL_TABLET | Freq: Four times a day (QID) | ORAL | Status: DC | PRN
  Administered 2017-08-13 – 2017-08-15 (×2): 650 mg via ORAL
  Filled 2017-08-13 (×2): qty 2

## 2017-08-13 MED ORDER — NITROGLYCERIN 0.4 MG SL SUBL: 0.4000 mg | SUBLINGUAL_TABLET | SUBLINGUAL | Status: DC | PRN

## 2017-08-13 MED ORDER — DM-GUAIFENESIN ER 30-600 MG PO TB12
1.0000 | ORAL_TABLET | Freq: Two times a day (BID) | ORAL | Status: DC | PRN
  Administered 2017-08-15: 1 via ORAL
  Filled 2017-08-13: qty 1

## 2017-08-13 MED ORDER — ESTRADIOL 2 MG PO TABS
2.0000 mg | ORAL_TABLET | Freq: Every day | ORAL | Status: DC
  Administered 2017-08-13 – 2017-08-14 (×3): 2 mg via ORAL
  Filled 2017-08-13 (×4): qty 1

## 2017-08-13 MED ORDER — AZITHROMYCIN 500 MG IV SOLR
500.0000 mg | INTRAVENOUS | Status: DC
  Administered 2017-08-13 – 2017-08-15 (×3): 500 mg via INTRAVENOUS
  Filled 2017-08-13 (×3): qty 500

## 2017-08-13 MED ORDER — FENOFIBRATE 160 MG PO TABS
160.0000 mg | ORAL_TABLET | Freq: Every day | ORAL | Status: DC
  Administered 2017-08-13 – 2017-08-15 (×3): 160 mg via ORAL
  Filled 2017-08-13 (×3): qty 1

## 2017-08-13 MED ORDER — DICLOFENAC SODIUM 1 % TD GEL: 2.0000 g | Freq: Four times a day (QID) | TRANSDERMAL | Status: DC | PRN

## 2017-08-13 MED ORDER — GABAPENTIN 100 MG PO CAPS
100.0000 mg | ORAL_CAPSULE | Freq: Three times a day (TID) | ORAL | Status: DC
  Administered 2017-08-13 – 2017-08-15 (×9): 100 mg via ORAL
  Filled 2017-08-13 (×9): qty 1

## 2017-08-13 MED ORDER — ONDANSETRON HCL 4 MG/2ML IJ SOLN: 4.0000 mg | Freq: Three times a day (TID) | INTRAMUSCULAR | Status: DC | PRN

## 2017-08-13 MED ORDER — INSULIN ASPART 100 UNIT/ML ~~LOC~~ SOLN
0.0000 [IU] | Freq: Three times a day (TID) | SUBCUTANEOUS | Status: DC
Start: 2017-08-13 — End: 2017-08-13

## 2017-08-13 MED ORDER — INSULIN ASPART 100 UNIT/ML IV SOLN
14.0000 [IU] | Freq: Once | INTRAVENOUS | Status: AC
  Administered 2017-08-13: 14 [IU] via INTRAVENOUS

## 2017-08-13 MED ORDER — IPRATROPIUM-ALBUTEROL 0.5-2.5 (3) MG/3ML IN SOLN
3.0000 mL | Freq: Three times a day (TID) | RESPIRATORY_TRACT | Status: DC
  Administered 2017-08-13 – 2017-08-14 (×2): 3 mL via RESPIRATORY_TRACT
  Filled 2017-08-13 (×2): qty 3

## 2017-08-13 MED ORDER — ATORVASTATIN CALCIUM 40 MG PO TABS
40.0000 mg | ORAL_TABLET | Freq: Every day | ORAL | Status: DC
  Administered 2017-08-13 – 2017-08-15 (×4): 40 mg via ORAL
  Filled 2017-08-13 (×4): qty 1

## 2017-08-13 MED ORDER — ALLOPURINOL 100 MG PO TABS
100.0000 mg | ORAL_TABLET | Freq: Every day | ORAL | Status: DC
  Administered 2017-08-13 – 2017-08-14 (×3): 100 mg via ORAL
  Filled 2017-08-13 (×3): qty 1

## 2017-08-13 NOTE — Progress Notes (Signed)
CRITICAL VALUE ALERT  Critical Value:  Lactic acid 2.6 Date & Time Notied:  08/13/2017 0743  Provider Notified: Dr. Nevada Crane  [id number]

## 2017-08-13 NOTE — Progress Notes (Signed)
PROGRESS NOTE  [name] RUE:[id number] DOB: 1961-03-17 DOA: 08/12/2017 PCP: [name], MD  HPI/Recap of past 24 hours: Pt seen and examined. Admits to lower back pain and hx of recurrent UTIs. U/A positive, awaiting culture results. Recent urine cx positive for esbl klebsiella. States breathing is better but cannot cough anything up. Denies chest pain, dyspnea or palpitations  Assessment/Plan: Principal Problem:   COPD with acute exacerbation ([place]) Active Problems:   Sarcoidosis of lung (HCC)   Hyperlipidemia   Gout   Essential hypertension   GERD   Type II diabetes mellitus with renal manifestations (HCC)   CKD (chronic kidney disease) stage 3, GFR 30-59 ml/min (HCC)   UTI (urinary tract infection)   Sepsis (HCC)   Chronic diastolic CHF (congestive heart failure) ([place])   Code Status: Full  Family Communication: no family members at bedside Disposition Plan: stay another midnight to continue Meropenem for ESBL klebsiella   Consultants:  None  Procedures:  None  Antimicrobials:  Meropenem, azithromycin  DVT prophylaxis:  lovenox 40 mg sq daily   Objective: Vitals:   08/13/17 0036 08/13/17 0114 08/13/17 0526 08/13/17 0824  BP:  (!) 169/66 (!) 141/60   Pulse:  90 (!) 110   Resp:  (!) 22 (!) 22   Temp:  98.8 F (37.1 C) 98.1 F (36.7 C)   TempSrc:  Oral Oral   SpO2: 100% 97% 97% 97%  Weight:  114 kg (251 lb 5.2 oz) 106.8 kg (235 lb 8 oz)   Height:  5\' 6"  (1.676 m)      Intake/Output Summary (Last 24 hours) at 08/13/17 0828 Last data filed at 08/13/17 0600  Gross per 24 hour  Intake             1140 ml  Output             1000 ml  Net              140 ml   Filed Weights   08/12/17 1904 08/13/17 0114 08/13/17 0526  Weight: 113.4 kg (250 lb) 114 kg (251 lb 5.2 oz) 106.8 kg (235 lb 8 oz)    Exam:  General: Not in acute distress HEENT:  Eyes: PERRL, EOMI, no scleral icterus.  ENT: No discharge from the ears and nose, no pharynx  injection, no tonsillar enlargement.    Neck: No JVD, no bruit, no mass felt. Heme: No neck lymph node enlargement. Cardiac: S1/S2, RRR, No murmurs, No gallops or rubs. Respiratory: Decreased air movement bilaterally, has mild diffused wheezing. GI: Soft, nondistended, nontender, no rebound pain, no organomegaly, BS present. GU: No hematuria Ext: No pitting leg edema bilaterally. 2+DP/PT pulse bilaterally. Musculoskeletal: No joint deformities, No joint redness or warmth, no limitation of ROM in spin. Skin: No rashes.  Neuro: Alert, oriented X3, cranial nerves II-XII grossly intact, moves all extremities normally.  Psych: Appropriate   Data Reviewed: CBC:  Recent Labs Lab 08/12/17 1901  WBC 24.5*  NEUTROABS 17.9*  HGB 11.5*  HCT 36.7  MCV 85.0  PLT 147*   Basic Metabolic Panel:  Recent Labs Lab 08/12/17 1901  NA 134*  K 3.9  CL 96*  CO2 24  GLUCOSE 238*  BUN 24*  CREATININE 1.23*  CALCIUM 9.3   GFR: Estimated Creatinine Clearance: 61.6 mL/min (A) (by C-G formula based on SCr of 1.23 mg/dL (H)). Liver Function Tests:  Recent Labs Lab 08/12/17 1901  AST 22  ALT 19  ALKPHOS 90  BILITOT 0.4  PROT 7.1  ALBUMIN 4.1   No results for input(s): LIPASE, AMYLASE in the last 168 hours. No results for input(s): AMMONIA in the last 168 hours. Coagulation Profile: No results for input(s): INR, PROTIME in the last 168 hours. Cardiac Enzymes: No results for input(s): CKTOTAL, CKMB, CKMBINDEX, TROPONINI in the last 168 hours. BNP (last 3 results) No results for input(s): PROBNP in the last 8760 hours. HbA1C: No results for input(s): HGBA1C in the last 72 hours. CBG:  Recent Labs Lab 08/13/17 0123 08/13/17 0803  GLUCAP 263* 413*   Lipid Profile: No results for input(s): CHOL, HDL, LDLCALC, TRIG, CHOLHDL, LDLDIRECT in the last 72 hours. Thyroid Function Tests: No results for input(s): TSH, T4TOTAL, FREET4, T3FREE, THYROIDAB in the last 72 hours. Anemia  Panel: No results for input(s): VITAMINB12, FOLATE, FERRITIN, TIBC, IRON, RETICCTPCT in the last 72 hours. Urine analysis:    Component Value Date/Time   COLORURINE YELLOW 08/12/2017 2115   APPEARANCEUR CLEAR 08/12/2017 2115   LABSPEC 1.016 08/12/2017 2115   PHURINE 5.0 08/12/2017 2115   GLUCOSEU NEGATIVE 08/12/2017 2115   HGBUR NEGATIVE 08/12/2017 2115   HGBUR negative 10/20/2010 0814   BILIRUBINUR NEGATIVE 08/12/2017 2115   BILIRUBINUR Negative 08/05/2017 1103   KETONESUR NEGATIVE 08/12/2017 2115   PROTEINUR NEGATIVE 08/12/2017 2115   UROBILINOGEN 0.2 08/05/2017 1103   UROBILINOGEN 0.2 04/27/2015 1226   NITRITE NEGATIVE 08/12/2017 2115   LEUKOCYTESUR MODERATE (A) 08/12/2017 2115   Sepsis Labs: @LABRCNTIP (procalcitonin:4,lacticidven:4)  ) Recent Results (from the past 240 hour(s))  Urine Culture     Status: Abnormal   Collection Time: 08/05/17 11:16 AM  Result Value Ref Range Status   MICRO NUMBER: [id number]  Final   SPECIMEN QUALITY: ADEQUATE  Final   Sample Source URINE  Final   STATUS: FINAL  Final   ISOLATE 1: ESBL Klebsiella pneumoniae (A)  Final      Susceptibility   Esbl klebsiella pneumoniae - URINE CULTURE, REFLEX    AMOX/CLAVULANIC 16 Intermediate     AMPICILLIN* >=32 Resistant      * Extended spectrum beta-lactamase (ESBL) producingorganisms demonstrate decreased activity withpenicillins, cephalosporins and aztreonam.    AMPICILLIN/SULBACTAM >=32 Resistant     CEFAZOLIN* >=64 Resistant      * Extended spectrum beta-lactamase (ESBL) producingorganisms demonstrate decreased activity withpenicillins, cephalosporins and aztreonam.For uncomplicated UTI caused by E. coli,K. pneumoniae or P. mirabilis: Cefazolin issusceptible if MIC <32 mcg/mL and predictssusceptible to the oral agents cefaclor, cefdinir,cefpodoxime, cefprozil, cefuroxime, cephalexinand loracarbef.    CEFEPIME  Resistant     CEFTRIAXONE 16 Resistant     CIPROFLOXACIN 2 Intermediate     LEVOFLOXACIN 1  Sensitive     ERTAPENEM <=0.5 Sensitive     GENTAMICIN <=1 Sensitive     IMIPENEM <=0.25 Sensitive     NITROFURANTOIN 64 Intermediate     PIP/TAZO 16 Sensitive     TOBRAMYCIN 8 Intermediate     TRIMETH/SULFA* >=320 Resistant      * Extended spectrum beta-lactamase (ESBL) producingorganisms demonstrate decreased activity withpenicillins, cephalosporins and aztreonam.For uncomplicated UTI caused by E. coli,K. pneumoniae or P. mirabilis: Cefazolin issusceptible if MIC <32 mcg/mL and predictssusceptible to the oral agents cefaclor, cefdinir,cefpodoxime, cefprozil, cefuroxime, cephalexinand loracarbef.Legend:S = Susceptible  I = IntermediateR = Resistant  NS = Not susceptible* = Not tested  NR = Not reported**NN = See antimicrobic comments      Studies: Dg Chest 2 View  Result Date: 08/12/2017 CLINICAL DATA:  Nonproductive cough x1 week. EXAM: CHEST  2 VIEW COMPARISON:  03/09/2017 CXR FINDINGS: The heart size and mediastinal contours are within normal limits. Both lungs are clear. The visualized skeletal structures are unremarkable. IMPRESSION: No active cardiopulmonary disease. Electronically Signed   By: Ashley Royalty M.D.   On: 08/12/2017 19:39    Scheduled Meds: . allopurinol  100 mg Oral QHS  . aspirin EC  81 mg Oral Daily  . atorvastatin  40 mg Oral q1800  . buPROPion  300 mg Oral Daily  . carvedilol  25 mg Oral BID WC  . diltiazem  120 mg Oral Daily  . enoxaparin (LOVENOX) injection  40 mg Subcutaneous Q24H  . estradiol  2 mg Oral QHS  . fenofibrate  160 mg Oral Daily  . fluticasone  2 spray Each Nare Daily  . gabapentin  100 mg Oral TID  . insulin aspart  0-20 Units Subcutaneous TID WC  . insulin glargine  30 Units Subcutaneous QHS  . insulin glargine  30 Units Subcutaneous Once  . ipratropium-albuterol  3 mL Nebulization Q4H  . losartan  50 mg Oral Daily  . methylPREDNISolone (SOLU-MEDROL) injection  80 mg Intravenous Q8H  . pantoprazole  40 mg Oral Daily  . PARoxetine Mesylate   7.5 mg Oral Daily  . venlafaxine XR  150 mg Oral QHS  . vitamin B-12  1,000 mcg Oral Daily    Continuous Infusions: . azithromycin Stopped (08/13/17 0511)  . meropenem (MERREM) IV Stopped (08/13/17 0315)     LOS: 0 days   COPD exacerbation in the setting of sarcoidosis of lung, and sepsis:  -Nebulizers: scheduled Duoneb and prn albuterol -Solu-Medrol 60 mg IV tid -IV azithromycin -Mucinex for cough  -hypersaline nebs, chest PT -Incentive spirometry -Follow up blood culture x2, sputum culture, respiratory virus panel, Flu pcr  Chronic diastolic CHF:  -2-D echo on 09/02/14 showed EF of 55-60% with grade 1 diastolic dysfunction.  -Hold Lasix due to sepsis -Continue Coreg and aspirin.  Sarcoidosis of lung:  -See above treatment  UTI (urinary tract infection):  - U/A + - + urine culture for ESBL Klebsiella pneumonia on 10/26. - meropenem, day#1 - Follow-up blood culture and urine culture.  HLD: -lipitor and Tricor  Gout: -continue home allopurinol  Essential hypertension: -Continue Cardizem, Coreg, Cozaar -IV hydralazine when necessary  GERD: -Protonix  Type II diabetes mellitus with renal manifestations ([place]):  - Last A1c 7.5 - glargine insulin, NovoLog, metformin at home -will decrease glargine dose from  45-30 unit -SSI  CKD (chronic kidney disease) stage 3: - Stable.  - Baseline creatinine 1.1-1.3 -Follow up renal function by BMP   Kayleen Memos, MD [facility] Pager [phone]  If 7PM-7AM, please contact night-coverage [email or web address] Password [email or web address] 08/13/2017, 8:28 AM

## 2017-08-13 NOTE — Progress Notes (Addendum)
Pharmacy Antibiotic Note  [name] is a 56 y.o. female admitted on 08/12/2017 with SOB and chest pain.  Pharmacy has been consulted for meropenem dosing for ESBL kleb UTI. She is also receiving azithromycin per MD for COPD exacerbation/sepsis.   Patient recently finished course of Levaquin outpatient for UTI, but continues to have symptoms per MD report. Patient is afebrile, WBC elevated at 24.5 and LA 2.56. SCr 1.23 for estimated normalized CrCl ~ 55-60 mL/min.   Noted allergy to ceftaroline (rash). Patient received cefazolin in 06/2017 and both cefazolin and ceftriaxone in years prior - no allergy documented to these agents.   Plan: Meropenem 1g IV q8hr  Monitor renal function, clinical picture, and culture data F/u length of therapy   Height: 5\' 6"  (167.6 cm) Weight: 250 lb (113.4 kg) IBW/kg (Calculated) : 59.3  Temp (24hrs), Avg:98.7 F (37.1 C), Min:98.7 F (37.1 C), Max:98.7 F (37.1 C)   Recent Labs Lab 08/12/17 1901 08/12/17 1917 08/12/17 2200  WBC 24.5*  --   --   CREATININE 1.23*  --   --   LATICACIDVEN  --  2.17* 2.56*    Estimated Creatinine Clearance: 63.7 mL/min (A) (by C-G formula based on SCr of 1.23 mg/dL (H)).    Allergies  Allergen Reactions  . Methotrexate Other (See Comments)    Peri-oral and buccal lesions  . Vancomycin Other (See Comments)    Nephrotoxicity   . Clindamycin/Lincomycin Nausea And Vomiting and Rash  . Chlorhexidine Itching  . Doxycycline Rash  . Lisinopril Cough  . Teflaro [Ceftaroline] Rash   Antimicrobials this admission: 11/3 Azithro >> 11/3 Meropenem >>   Microbiology results: 10/26 urine cx: ESBL kleb (see sensitivities)  [name], PharmD Clinical Pharmacist 08/13/17 12:37 AM

## 2017-08-14 ENCOUNTER — Other Ambulatory Visit: Payer: Self-pay

## 2017-08-14 DIAGNOSIS — Z794 Long term (current) use of insulin: Secondary | ICD-10-CM

## 2017-08-14 DIAGNOSIS — E7800 Pure hypercholesterolemia, unspecified: Secondary | ICD-10-CM

## 2017-08-14 DIAGNOSIS — E1129 Type 2 diabetes mellitus with other diabetic kidney complication: Secondary | ICD-10-CM

## 2017-08-14 LAB — GLUCOSE, CAPILLARY
Glucose-Capillary: 284 mg/dL — ABNORMAL HIGH (ref 65–99)
Glucose-Capillary: 310 mg/dL — ABNORMAL HIGH (ref 65–99)
Glucose-Capillary: 277 mg/dL — ABNORMAL HIGH (ref 65–99)
Glucose-Capillary: 436 mg/dL — ABNORMAL HIGH (ref 65–99)

## 2017-08-14 LAB — BASIC METABOLIC PANEL
Anion gap: 10 (ref 5–15)
BUN: 16 mg/dL (ref 6–20)
CO2: 26 mmol/L (ref 22–32)
Calcium: 9 mg/dL (ref 8.9–10.3)
Chloride: 99 mmol/L — ABNORMAL LOW (ref 101–111)
Creatinine, Ser: 0.85 mg/dL (ref 0.44–1.00)
GFR calc Af Amer: >60 mL/min (ref >60)
GFR calc non Af Amer: >60 mL/min (ref >60)
Glucose, Bld: 346 mg/dL — ABNORMAL HIGH (ref 65–99)
Potassium: 4.4 mmol/L (ref 3.5–5.1)
Sodium: 135 mmol/L (ref 135–145)

## 2017-08-14 LAB — CBC
HCT: 30.9 % — ABNORMAL LOW (ref 36.0–46.0)
Hemoglobin: 9.5 g/dL — ABNORMAL LOW (ref 12.0–15.0)
MCH: 26.4 pg (ref 26.0–34.0)
MCHC: 30.7 g/dL (ref 30.0–36.0)
MCV: 85.8 fL (ref 78.0–100.0)
Platelets: 416 10*3/uL — ABNORMAL HIGH (ref 150–400)
RBC: 3.6 MIL/uL — ABNORMAL LOW (ref 3.87–5.11)
RDW: 15 % (ref 11.5–15.5)
WBC: 15.7 10*3/uL — ABNORMAL HIGH (ref 4.0–10.5)

## 2017-08-14 LAB — LACTIC ACID, PLASMA: Lactic Acid, Venous: 3.3 mmol/L (ref 0.5–1.9)

## 2017-08-14 MED ORDER — INSULIN ASPART 100 UNIT/ML IV SOLN
16.0000 [IU] | Freq: Once | INTRAVENOUS | Status: AC
  Administered 2017-08-14: 16 [IU] via INTRAVENOUS

## 2017-08-14 MED ORDER — SODIUM CHLORIDE 0.9 % IV SOLN
INTRAVENOUS | Status: DC
  Administered 2017-08-14 – 2017-08-15 (×2): via INTRAVENOUS

## 2017-08-14 MED ORDER — SODIUM CHLORIDE 3 % IN NEBU
4.0000 mL | INHALATION_SOLUTION | Freq: Every day | RESPIRATORY_TRACT | Status: DC
  Administered 2017-08-15: 4 mL via RESPIRATORY_TRACT
  Filled 2017-08-14: qty 4

## 2017-08-14 MED ORDER — IPRATROPIUM-ALBUTEROL 0.5-2.5 (3) MG/3ML IN SOLN
3.0000 mL | Freq: Two times a day (BID) | RESPIRATORY_TRACT | Status: DC
  Administered 2017-08-14 – 2017-08-15 (×2): 3 mL via RESPIRATORY_TRACT
  Filled 2017-08-14 (×2): qty 3

## 2017-08-14 NOTE — Plan of Care (Signed)
  Progressing Education: Knowledge of [facility] Education information/materials will improve 08/14/2017 2051 - Progressing by Jerene Bears, RN Safety: Ability to remain free from injury will improve 08/14/2017 2051 - Progressing by Jerene Bears, RN Health Behavior/Discharge Planning: Ability to manage health-related needs will improve 08/14/2017 2051 - Progressing by Jerene Bears, RN Pain Managment: General experience of comfort will improve 08/14/2017 2051 - Progressing by Jerene Bears, RN Physical Regulation: Ability to maintain clinical measurements within normal limits will improve 08/14/2017 2051 - Progressing by Jerene Bears, RN Will remain free from infection 08/14/2017 2051 - Progressing by Jerene Bears, RN Tissue Perfusion: Risk factors for ineffective tissue perfusion will decrease 08/14/2017 2051 - Progressing by Jerene Bears, RN Activity: Risk for activity intolerance will decrease 08/14/2017 2051 - Progressing by Jerene Bears, RN Fluid Volume: Ability to maintain a balanced intake and output will improve 08/14/2017 2051 - Progressing by Jerene Bears, RN

## 2017-08-14 NOTE — Plan of Care (Signed)
  Progressing Education: Knowledge of [place] Education information/materials will improve 08/14/2017 2051 - Progressing by Jerene Bears, RN Safety: Ability to remain free from injury will improve 08/14/2017 2051 - Progressing by Jerene Bears, RN Health Behavior/Discharge Planning: Ability to manage health-related needs will improve 08/14/2017 2051 - Progressing by Jerene Bears, RN Pain Managment: General experience of comfort will improve 08/14/2017 2051 - Progressing by Jerene Bears, RN Physical Regulation: Ability to maintain clinical measurements within normal limits will improve 08/14/2017 2051 - Progressing by Jerene Bears, RN Will remain free from infection 08/14/2017 2051 - Progressing by Jerene Bears, RN Tissue Perfusion: Risk factors for ineffective tissue perfusion will decrease 08/14/2017 2051 - Progressing by Jerene Bears, RN Activity: Risk for activity intolerance will decrease 08/14/2017 2051 - Progressing by Jerene Bears, RN Fluid Volume: Ability to maintain a balanced intake and output will improve 08/14/2017 2051 - Progressing by Jerene Bears, RN

## 2017-08-14 NOTE — Progress Notes (Signed)
CRITICAL VALUE ALERT  Critical Value:  Lactic acid 3.3  Date & Time Notied:  08/14/2017 1105  Provider Notified: Dr. Nevada Crane paged at 1106  Orders Received/Actions taken: Dr. Nevada Crane verbal order for normal saline @75 /Ml hr

## 2017-08-14 NOTE — Progress Notes (Addendum)
PROGRESS NOTE  [name] ZOX:[id number] DOB: March 28, 1961 DOA: 08/12/2017 PCP: [name], MD  HPI/Recap of past 24 hours: Admits to lower back pain dull 4/10 non radiating. Denies nausea or abd pain. States breathing is improved but would like to cough up mucus better.  Assessment/Plan: Principal Problem:   COPD with acute exacerbation (HCC) Active Problems:   Sarcoidosis of lung ([place])   Hyperlipidemia   Gout   Essential hypertension   GERD   Type II diabetes mellitus with renal manifestations (HCC)   CKD (chronic kidney disease) stage 3, GFR 30-59 ml/min (HCC)   UTI (urinary tract infection)   Sepsis (HCC)   Chronic diastolic CHF (congestive heart failure) ([place])   Code Status: Full  Family Communication: no family members at bedside Disposition Plan: stay another midnight to continue Meropenem for ESBL klebsiella   Consultants:  None  Procedures:  None  Antimicrobials:  Meropenem, azithromycin   DVT prophylaxis:  lovenox 40 mg sq daily   Objective: Vitals:   08/14/17 0610 08/14/17 0610 08/14/17 0735 08/14/17 0829  BP:  (!) 136/51  (!) 152/61  Pulse:  78    Resp:  19    Temp:  98.1 F (36.7 C)    TempSrc:  Oral    SpO2:  98% 98%   Weight: 118.9 kg (262 lb 3.2 oz)     Height:        Intake/Output Summary (Last 24 hours) at 08/14/2017 0835 Last data filed at 08/13/2017 1510 Gross per 24 hour  Intake 100 ml  Output 900 ml  Net -800 ml   Filed Weights   08/13/17 0114 08/13/17 0526 08/14/17 0610  Weight: 114 kg (251 lb 5.2 oz) 106.8 kg (235 lb 8 oz) 118.9 kg (262 lb 3.2 oz)    Exam:  General:Not in acute distress HEENT: Eyes: PERRL, EOMI, no scleral icterus. ENT: No discharge from the ears and nose, no pharynx injection, no tonsillar enlargement.  Neck:No JVD, no bruit, no mass felt. Heme:No neck lymph node enlargement. Cardiac:S1/S2, RRR, No murmurs, No gallops or rubs. Respiratory:Decreased air movement  bilaterally, has mild diffused wheezing. WJ:XBJY, nondistended, nontender, no rebound pain, no organomegaly, BS present. GU: No hematuria Ext:No pitting leg edema bilaterally. 2+DP/PT pulse bilaterally. Musculoskeletal:No joint deformities, No joint redness or warmth,no limitation of ROMin spin. Skin: No rashes.  Neuro: Alert, oriented X3, cranial nerves II-XII grossly intact, moves all extremities normally.  Psych:Appropriate    Data Reviewed: CBC: Recent Labs  Lab 08/12/17 1901 08/14/17 0415  WBC 24.5* 15.7*  NEUTROABS 17.9*  --   HGB 11.5* 9.5*  HCT 36.7 30.9*  MCV 85.0 85.8  PLT 519* 782*   Basic Metabolic Panel: Recent Labs  Lab 08/12/17 1901 08/14/17 0415  NA 134* 135  K 3.9 4.4  CL 96* 99*  CO2 24 26  GLUCOSE 238* 346*  BUN 24* 16  CREATININE 1.23* 0.85  CALCIUM 9.3 9.0   GFR: Estimated Creatinine Clearance: 94.6 mL/min (by C-G formula based on SCr of 0.85 mg/dL). Liver Function Tests: Recent Labs  Lab 08/12/17 1901  AST 22  ALT 19  ALKPHOS 90  BILITOT 0.4  PROT 7.1  ALBUMIN 4.1   No results for input(s): LIPASE, AMYLASE in the last 168 hours. No results for input(s): AMMONIA in the last 168 hours. Coagulation Profile: No results for input(s): INR, PROTIME in the last 168 hours. Cardiac Enzymes: No results for input(s): CKTOTAL, CKMB, CKMBINDEX, TROPONINI in the last 168 hours. BNP (  last 3 results) No results for input(s): PROBNP in the last 8760 hours. HbA1C: Recent Labs    08/13/17 0823  HGBA1C 7.8*   CBG: Recent Labs  Lab 08/13/17 0803 08/13/17 1217 08/13/17 1713 08/13/17 2158 08/14/17 0809  GLUCAP 413* 346* 260* 441* 284*   Lipid Profile: No results for input(s): CHOL, HDL, LDLCALC, TRIG, CHOLHDL, LDLDIRECT in the last 72 hours. Thyroid Function Tests: No results for input(s): TSH, T4TOTAL, FREET4, T3FREE, THYROIDAB in the last 72 hours. Anemia Panel: No results for input(s): VITAMINB12, FOLATE, FERRITIN, TIBC, IRON,  RETICCTPCT in the last 72 hours. Urine analysis:    Component Value Date/Time   COLORURINE YELLOW 08/12/2017 2115   APPEARANCEUR CLEAR 08/12/2017 2115   LABSPEC 1.016 08/12/2017 2115   PHURINE 5.0 08/12/2017 2115   GLUCOSEU NEGATIVE 08/12/2017 2115   HGBUR NEGATIVE 08/12/2017 2115   HGBUR negative 10/20/2010 0814   BILIRUBINUR NEGATIVE 08/12/2017 2115   BILIRUBINUR Negative 08/05/2017 1103   KETONESUR NEGATIVE 08/12/2017 2115   PROTEINUR NEGATIVE 08/12/2017 2115   UROBILINOGEN 0.2 08/05/2017 1103   UROBILINOGEN 0.2 04/27/2015 1226   NITRITE NEGATIVE 08/12/2017 2115   LEUKOCYTESUR MODERATE (A) 08/12/2017 2115   Sepsis Labs: @LABRCNTIP (procalcitonin:4,lacticidven:4)  ) Recent Results (from the past 240 hour(s))  Urine Culture     Status: Abnormal   Collection Time: 08/05/17 11:16 AM  Result Value Ref Range Status   MICRO NUMBER: [id number]  Final   SPECIMEN QUALITY: ADEQUATE  Final   Sample Source URINE  Final   STATUS: FINAL  Final   ISOLATE 1: ESBL Klebsiella pneumoniae (A)  Final      Susceptibility   Esbl klebsiella pneumoniae - URINE CULTURE, REFLEX    AMOX/CLAVULANIC 16 Intermediate     AMPICILLIN* >=32 Resistant      * Extended spectrum beta-lactamase (ESBL) producingorganisms demonstrate decreased activity withpenicillins, cephalosporins and aztreonam.    AMPICILLIN/SULBACTAM >=32 Resistant     CEFAZOLIN* >=64 Resistant      * Extended spectrum beta-lactamase (ESBL) producingorganisms demonstrate decreased activity withpenicillins, cephalosporins and aztreonam.For uncomplicated UTI caused by E. coli,K. pneumoniae or P. mirabilis: Cefazolin issusceptible if MIC <32 mcg/mL and predictssusceptible to the oral agents cefaclor, cefdinir,cefpodoxime, cefprozil, cefuroxime, cephalexinand loracarbef.    CEFEPIME  Resistant     CEFTRIAXONE 16 Resistant     CIPROFLOXACIN 2 Intermediate     LEVOFLOXACIN 1 Sensitive     ERTAPENEM <=0.5 Sensitive     GENTAMICIN <=1 Sensitive      IMIPENEM <=0.25 Sensitive     NITROFURANTOIN 64 Intermediate     PIP/TAZO 16 Sensitive     TOBRAMYCIN 8 Intermediate     TRIMETH/SULFA* >=320 Resistant      * Extended spectrum beta-lactamase (ESBL) producingorganisms demonstrate decreased activity withpenicillins, cephalosporins and aztreonam.For uncomplicated UTI caused by E. coli,K. pneumoniae or P. mirabilis: Cefazolin issusceptible if MIC <32 mcg/mL and predictssusceptible to the oral agents cefaclor, cefdinir,cefpodoxime, cefprozil, cefuroxime, cephalexinand loracarbef.Legend:S = Susceptible  I = IntermediateR = Resistant  NS = Not susceptible* = Not tested  NR = Not reported**NN = See antimicrobic comments  MRSA PCR Screening     Status: Abnormal   Collection Time: 08/13/17  3:07 AM  Result Value Ref Range Status   MRSA by PCR POSITIVE (A) NEGATIVE Final    Comment:        The GeneXpert MRSA Assay (FDA approved for NASAL specimens only), is one component of a comprehensive MRSA colonization surveillance program. It is not intended to diagnose MRSA infection nor  to guide or monitor treatment for MRSA infections. RESULT CALLED TO, READ BACK BY AND VERIFIED WITH: E.MCAULEY RN AT 0912 08/23/17 BY A.DAVIS   Respiratory Panel by PCR     Status: None   Collection Time: 08/13/17  3:45 AM  Result Value Ref Range Status   Adenovirus NOT DETECTED NOT DETECTED Final   Coronavirus 229E NOT DETECTED NOT DETECTED Final   Coronavirus HKU1 NOT DETECTED NOT DETECTED Final   Coronavirus NL63 NOT DETECTED NOT DETECTED Final   Coronavirus OC43 NOT DETECTED NOT DETECTED Final   Metapneumovirus NOT DETECTED NOT DETECTED Final   Rhinovirus / Enterovirus NOT DETECTED NOT DETECTED Final   Influenza A NOT DETECTED NOT DETECTED Final   Influenza B NOT DETECTED NOT DETECTED Final   Parainfluenza Virus 1 NOT DETECTED NOT DETECTED Final   Parainfluenza Virus 2 NOT DETECTED NOT DETECTED Final   Parainfluenza Virus 3 NOT DETECTED NOT DETECTED Final    Parainfluenza Virus 4 NOT DETECTED NOT DETECTED Final   Respiratory Syncytial Virus NOT DETECTED NOT DETECTED Final   Bordetella pertussis NOT DETECTED NOT DETECTED Final   Chlamydophila pneumoniae NOT DETECTED NOT DETECTED Final   Mycoplasma pneumoniae NOT DETECTED NOT DETECTED Final      Studies: No results found.  Scheduled Meds: . allopurinol  100 mg Oral QHS  . aspirin EC  81 mg Oral Daily  . atorvastatin  40 mg Oral q1800  . buPROPion  300 mg Oral Daily  . carvedilol  25 mg Oral BID WC  . diltiazem  120 mg Oral Daily  . enoxaparin (LOVENOX) injection  40 mg Subcutaneous Q24H  . estradiol  2 mg Oral QHS  . fenofibrate  160 mg Oral Daily  . fluticasone  2 spray Each Nare Daily  . gabapentin  100 mg Oral TID  . insulin aspart  0-20 Units Subcutaneous TID WC  . insulin glargine  30 Units Subcutaneous QHS  . ipratropium-albuterol  3 mL Nebulization TID  . losartan  50 mg Oral Daily  . methylPREDNISolone (SOLU-MEDROL) injection  80 mg Intravenous Q8H  . mupirocin ointment  1 application Nasal BID  . pantoprazole  40 mg Oral Daily  . PARoxetine Mesylate  7.5 mg Oral Daily  . venlafaxine XR  150 mg Oral QHS  . vitamin B-12  1,000 mcg Oral Daily    Continuous Infusions: . azithromycin Stopped (08/14/17 0220)  . meropenem (MERREM) IV Stopped (08/14/17 0200)     LOS: 1 day   Assessment and plan:  COPD exacerbation in the setting of sarcoidosis of lung, and sepsis:  -Nebulizers: scheduled Duoneb and prn albuterol -Solu-Medrol 60 mg IV tid -IV azithromycin -Mucinex for cough  -hypersaline nebs, chest PT -Incentive spirometry -Follow up blood culture x2, sputum culture, respiratory virus panel, Flu pcr  Chronic diastolic CHF:  -2-D echo on 09/02/14 showed EF of 55-60% with grade 1 diastolic dysfunction.  -Hold Lasix due to sepsis -Continue Coreg and aspirin.  Sarcoidosis of lung: -See above treatment  UTI (urinary tract infection):  - U/A + - + urine culture  for ESBL Klebsiella pneumonia on 10/26. - meropenem, day#2 - Follow-up blood culture and urine culture. - MRSA nares +  HLD: -lipitor and Tricor  Gout: -continue home allopurinol  Essential hypertension: -Continue Cardizem, Coreg, Cozaar -IV hydralazine when necessary  GERD: -Protonix  Type II diabetes mellitus with renal manifestations ([place]): - Last A1c 7.5 - glargine insulin, NovoLog, metformin at home -will decrease glarginedose from 45-30 unit -SSI  CKD (chronic kidney disease) stage 3: - Stable.  - Baseline creatinine 1.1-1.3 -Follow up renal function by BMP    Kayleen Memos, MD [facility] Pager [phone]  If 7PM-7AM, please contact night-coverage [email or web address] Password [email or web address] 08/14/2017, 8:35 AM

## 2017-08-15 DIAGNOSIS — I5032 Chronic diastolic (congestive) heart failure: Secondary | ICD-10-CM

## 2017-08-15 LAB — EXPECTORATED SPUTUM ASSESSMENT W GRAM STAIN, RFLX TO RESP C

## 2017-08-15 LAB — URINE CULTURE: Culture: NO GROWTH

## 2017-08-15 LAB — BASIC METABOLIC PANEL
Anion gap: 9 (ref 5–15)
BUN: 19 mg/dL (ref 6–20)
CO2: 26 mmol/L (ref 22–32)
Calcium: 8.8 mg/dL — ABNORMAL LOW (ref 8.9–10.3)
Chloride: 100 mmol/L — ABNORMAL LOW (ref 101–111)
Creatinine, Ser: 0.86 mg/dL (ref 0.44–1.00)
GFR calc Af Amer: >60 mL/min (ref >60)
GFR calc non Af Amer: >60 mL/min (ref >60)
Glucose, Bld: 326 mg/dL — ABNORMAL HIGH (ref 65–99)
Potassium: 4.5 mmol/L (ref 3.5–5.1)
Sodium: 135 mmol/L (ref 135–145)

## 2017-08-15 LAB — CBC
HCT: 30.9 % — ABNORMAL LOW (ref 36.0–46.0)
Hemoglobin: 9.5 g/dL — ABNORMAL LOW (ref 12.0–15.0)
MCH: 26.2 pg (ref 26.0–34.0)
MCHC: 30.7 g/dL (ref 30.0–36.0)
MCV: 85.1 fL (ref 78.0–100.0)
Platelets: 411 10*3/uL — ABNORMAL HIGH (ref 150–400)
RBC: 3.63 MIL/uL — ABNORMAL LOW (ref 3.87–5.11)
RDW: 14.8 % (ref 11.5–15.5)
WBC: 13.4 10*3/uL — ABNORMAL HIGH (ref 4.0–10.5)

## 2017-08-15 LAB — LACTIC ACID, PLASMA: Lactic Acid, Venous: 2.2 mmol/L (ref 0.5–1.9)

## 2017-08-15 LAB — TROPONIN I: Troponin I: <0.03 ng/mL (ref <0.03)

## 2017-08-15 LAB — GLUCOSE, CAPILLARY
Glucose-Capillary: 335 mg/dL — ABNORMAL HIGH (ref 65–99)
Glucose-Capillary: 371 mg/dL — ABNORMAL HIGH (ref 65–99)
Glucose-Capillary: 373 mg/dL — ABNORMAL HIGH (ref 65–99)
Glucose-Capillary: 315 mg/dL — ABNORMAL HIGH (ref 65–99)

## 2017-08-15 MED ORDER — DIPHENHYDRAMINE HCL 50 MG/ML IJ SOLN
12.5000 mg | Freq: Once | INTRAMUSCULAR | Status: AC
  Administered 2017-08-15: 12.5 mg via INTRAVENOUS
  Filled 2017-08-15: qty 1

## 2017-08-15 MED ORDER — INSULIN ASPART 100 UNIT/ML ~~LOC~~ SOLN
8.0000 [IU] | Freq: Once | SUBCUTANEOUS | Status: AC
  Administered 2017-08-15: 8 [IU] via SUBCUTANEOUS

## 2017-08-15 MED ORDER — PREDNISONE 5 MG PO TABS
5.0000 mg | ORAL_TABLET | Freq: Every day | ORAL | Status: DC
  Administered 2017-08-15: 5 mg via ORAL
  Filled 2017-08-15: qty 1

## 2017-08-15 MED ORDER — KETOROLAC TROMETHAMINE 15 MG/ML IJ SOLN
7.5000 mg | Freq: Once | INTRAMUSCULAR | Status: AC
  Administered 2017-08-15: 7.5 mg via INTRAVENOUS
  Filled 2017-08-15: qty 1

## 2017-08-15 MED ORDER — CIPROFLOXACIN HCL 500 MG PO TABS: 500.0000 mg | ORAL_TABLET | Freq: Two times a day (BID) | ORAL | 0 refills | Status: AC

## 2017-08-15 MED ORDER — METOCLOPRAMIDE HCL 5 MG/ML IJ SOLN
5.0000 mg | Freq: Once | INTRAMUSCULAR | Status: AC
  Administered 2017-08-15: 5 mg via INTRAVENOUS
  Filled 2017-08-15: qty 2

## 2017-08-15 MED ORDER — AZITHROMYCIN 250 MG PO TABS: ORAL_TABLET | ORAL | 0 refills | Status: AC

## 2017-08-15 NOTE — Progress Notes (Signed)
[name] to be D/C'd home per MD order. Discussed with the patient and all questions fully answered.  Allergies as of 08/15/2017      Reactions   Methotrexate Other (See Comments)   Peri-oral and buccal lesions   Vancomycin Other (See Comments)   Nephrotoxicity   Clindamycin/lincomycin Nausea And Vomiting, Rash   Chlorhexidine Itching   Doxycycline Rash   Lisinopril Cough   Teflaro [ceftaroline] Rash      Medication List    STOP taking these medications   KLOR-CON M20 20 MEQ tablet Generic drug:  potassium chloride SA   levofloxacin 500 MG tablet Commonly known as:  LEVAQUIN   losartan 50 MG tablet Commonly known as:  COZAAR     TAKE these medications   albuterol 108 (90 Base) MCG/ACT inhaler Commonly known as:  PROVENTIL HFA;VENTOLIN HFA Inhale 1-2 puffs into the lungs every 6 (six) hours as needed for wheezing or shortness of breath. What changed:  Another medication with the same name was removed. Continue taking this medication, and follow the directions you see here.   allopurinol 100 MG tablet Commonly known as:  ZYLOPRIM Take 100 mg by mouth at bedtime.   aspirin EC 81 MG tablet Take 81 mg by mouth daily.   atorvastatin 40 MG tablet Commonly known as:  LIPITOR TAKE 1 TAB BY MOUTH ONCE DAILY   azithromycin 250 MG tablet Commonly known as:  ZITHROMAX Z-PAK Take 2 tablets (500 mg) on  Day 1,  followed by 1 tablet (250 mg) once daily on Days 2 through 5.   BAYER CONTOUR MONITOR w/Device Kit Use daily to check sugar   BAYER MICROLET LANCETS lancets Use as instructed to check sugar 3 times daily   BRISDELLE 7.5 MG Caps Generic drug:  PARoxetine Mesylate Take 7.5 mg by mouth daily.   buPROPion 300 MG 24 hr tablet Commonly known as:  WELLBUTRIN XL TAKE 1 TABLET (300 MG TOTAL) BY MOUTH DAILY.   carvedilol 25 MG tablet Commonly known as:  COREG Take 1 tablet (25 mg total) by mouth 2 (two) times daily with a meal.   ciprofloxacin 500 MG  tablet Commonly known as:  CIPRO Take 1 tablet (500 mg total) 2 (two) times daily for 7 days by mouth.   dexlansoprazole 60 MG capsule Commonly known as:  DEXILANT Take 1 capsule (60 mg total) by mouth daily.   diclofenac sodium 1 % Gel Commonly known as:  VOLTAREN APPLY 2 GRAMS TOPICALLY 4 TIMES A DAY What changed:  See the new instructions.   diltiazem 120 MG 24 hr capsule Commonly known as:  CARDIZEM CD Take 1 capsule (120 mg total) by mouth daily.   estradiol 2 MG tablet Commonly known as:  ESTRACE Take 2 mg by mouth at bedtime.   fenofibrate 145 MG tablet Commonly known as:  TRICOR TAKE 1 TABLET BY MOUTH DAILY   fluticasone 50 MCG/ACT nasal spray Commonly known as:  FLONASE Place 2 sprays into both nostrils daily.   furosemide 40 MG tablet Commonly known as:  LASIX TAKE 1 TABLET BY MOUTH TWICE A DAY What changed:    how much to take  how to take this  when to take this   gabapentin 100 MG capsule Commonly known as:  NEURONTIN TAKE 1 CAPSULE BY MOUTH THREE TIMES A DAY   glucose blood test strip Commonly known as:  BAYER CONTOUR TEST Use as instructed to check sugar 3 times daily   insulin aspart 100 UNIT/ML  FlexPen Commonly known as:  NOVOLOG FLEXPEN Inject 22-27 Units into the skin 3 (three) times daily with meals.   Insulin Glargine 300 UNIT/ML Sopn Commonly known as:  TOUJEO MAX SOLOSTAR Inject 45 Units into the skin at bedtime.   Insulin Pen Needle 33G X 4 MM Misc 1 each by Does not apply route 4 (four) times daily.   LORazepam 0.5 MG tablet Commonly known as:  ATIVAN Take 0.5 mg by mouth every 12 (twelve) hours as needed for anxiety.   metFORMIN 1000 MG tablet Commonly known as:  GLUCOPHAGE Take 1 tablet (1,000 mg total) by mouth 2 (two) times daily with a meal.   mometasone-formoterol 200-5 MCG/ACT Aero Commonly known as:  DULERA Inhale 2 puffs into the lungs 2 (two) times daily.   nitroGLYCERIN 0.4 MG SL tablet Commonly known as:   NITROSTAT Place 1 tablet (0.4 mg total) under the tongue every 5 (five) minutes as needed for chest pain. Reported on 01/05/2016   orlistat 120 MG capsule Commonly known as:  XENICAL Take 1 capsule (120 mg total) by mouth 3 (three) times daily with meals.   oxyCODONE-acetaminophen 5-325 MG tablet Commonly known as:  ROXICET Take 1 tablet by mouth every 4 (four) hours as needed for severe pain.   Potassium Chloride ER 20 MEQ Tbcr Take 1 tablet by mouth 3 (three) times daily.   predniSONE 5 MG tablet Commonly known as:  DELTASONE Take 5 mg by mouth daily with breakfast.   traMADol 50 MG tablet Commonly known as:  ULTRAM Take 1 tablet (50 mg total) by mouth every 12 (twelve) hours as needed for moderate pain or severe pain.   venlafaxine XR 75 MG 24 hr capsule Commonly known as:  EFFEXOR-XR Take 2 capsules (150 mg total) by mouth at bedtime.   vitamin B-12 1000 MCG tablet Commonly known as:  CYANOCOBALAMIN Take 1,000 mcg by mouth daily.       VVS, Skin clean, dry and intact without evidence of skin break down, no evidence of skin tears noted.  IV catheter discontinued intact. Site without signs and symptoms of complications. Dressing and pressure applied.  An After Visit Summary was printed and given to the patient.  Patient escorted via [place], and D/C home via private auto.  [name]  08/15/2017 6:36 PM

## 2017-08-15 NOTE — Progress Notes (Signed)
Inpatient Diabetes Program Recommendations  AACE/ADA: New Consensus Statement on Inpatient Glycemic Control (2015)  Target Ranges:  Prepandial:   less than 140 mg/dL      Peak postprandial:   less than 180 mg/dL (1-2 hours)      Critically ill patients:  140 - 180 mg/dL   Lab Results  Component Value Date   GLUCAP 335 (H) 08/15/2017   HGBA1C 7.8 (H) 08/13/2017    Review of Glycemic Control Results for [name], [name] (MRN [id number]) as of 08/15/2017 09:37  Ref. Range 08/14/2017 08:09 08/14/2017 13:16 08/14/2017 17:56 08/14/2017 22:11 08/15/2017 07:52  Glucose-Capillary Latest Ref Range: 65 - 99 mg/dL 284 (H) 310 (H) 277 (H) 436 (H) 335 (H)   Diabetes history: DM2 Outpatient Diabetes medications: Toujeo 45 units qd + Metformin 1 gm bid Current orders for Inpatient glycemic control: Lantus 30 units + Novolog correction 0-20 units tid  Inpatient Diabetes Program Recommendations:    Noted steroids now decreased. -Increase Lantus to 45 units qd  Thank you, Nani Gasser. Turner Kunzman, RN, MSN, CDE  Diabetes Coordinator Inpatient Glycemic Control Team Team Pager [phone] (8am-5pm) 08/15/2017 9:39 AM

## 2017-08-15 NOTE — Discharge Summary (Addendum)
Discharge Summary  [name] QMG:[id number] DOB: 09/27/1961  PCP: [name], MD  Admit date: 08/12/2017 Discharge date: 08/15/2017  Time spent: 25 minutes  Recommendations for Outpatient Follow-up:  1. Follow up with PCP within a week 2. Take your medications as prescribed  Discharge Diagnoses:  Active Hospital Problems   Diagnosis Date Noted  . COPD with acute exacerbation ([place]) 03/14/2014  . Chronic diastolic CHF (congestive heart failure) ([place]) 08/13/2017  . UTI (urinary tract infection) 08/12/2017  . Sepsis ([place]) 08/12/2017  . CKD (chronic kidney disease) stage 3, GFR 30-59 ml/min (HCC) 04/27/2015  . COPD exacerbation ([place]) 12/02/2013  . Gout 08/20/2010  . Sarcoidosis of lung ([place]) 04/10/2007  . Essential hypertension 08/21/2006  . GERD 08/21/2006  . Hyperlipidemia 08/21/2006  . Type II diabetes mellitus with renal manifestations ([place]) 08/21/2006    Resolved Hospital Problems  No resolved problems to display.    Discharge Condition: stable  Diet recommendation: Resume previous diet  Vitals:   08/15/17 0912 08/15/17 0917  BP:    Pulse:    Resp:    Temp:    SpO2: 96% 98%    History of present illness:  [name] is a 56 y.o. female with medical history significant of sarcoidosis, COPD, hypertension, hyperlipidemia, GERD, gout, depression, anxiety, dCHF, anemia, CKD-3, obesity, who presents with shortness breath and increased urinary frequency.  Patient states that she has been having worsening shortness of breath for more than one week, which has been progressively getting worse. Patient can barely speak in full sentence. Patient was seen by PCP one weeks ago, and was started with Levaquin, also increased her chronic prednisone dose from 5 to 50 mg daily, but her symptoms have not improved. Patient has dry cough, no fever or chills. She reported to ED physician that she had chest discomfort, but denies any chest pain to me. No tenderness in  the calf areas. Patient has increased urinary frequency, but no dysuria or burning on urination. She has nausea, but no vomiting, diarrhea or abdominal pain. No unilateral weakness.  Chest xray unremarkable for any acute findings. U/A positive. Prior hx of ESBL klebsiella. Meropenem x3 days. MRSA nares positive.  On the day of discharge the pt was hemodynamically stable. Recommend that she follows up with her PCP within a week.   Hospital Course:  Principal Problem:   COPD with acute exacerbation ([place]) Active Problems:   Sarcoidosis of lung ([place])   Hyperlipidemia   Gout   Essential hypertension   GERD   Type II diabetes mellitus with renal manifestations (HCC)   COPD exacerbation (HCC)   CKD (chronic kidney disease) stage 3, GFR 30-59 ml/min (HCC)   UTI (urinary tract infection)   Sepsis (HCC)   Chronic diastolic CHF (congestive heart failure) (HCC)  COPD exacerbation in the setting of sarcoidosis of lung, and sepsis:  -Duonebs and prn albuterol -prednisone -Zpack  Chronic diastolic CHF: -2-D echo on 09/02/14 showed EF of 55-60% with grade 1 diastolic dysfunction.  -Coreg and aspirin.  Sarcoidosis of lung: -See above treatment  UTI (urinary tract infection): - U/A + - +urine culture for ESBL Klebsiella pneumonia on 10/26. - meropenem x 3 days - MRSA nares + - po ciprofloxacin 500 mg BID x 7 days  HLD: -lipitor and Tricor  Gout: -continue home allopurinol  Essential hypertension: -Continue Cardizem, Coreg, Cozaar  GERD: -Protonix  Type II diabetes mellitus with renal manifestations ([place]): -Last A1c 7.5 -glargine insulin, NovoLog, metformin at home  AKI -Resolved -Cr 0.86 from 1.23   Procedures:  None  Consultations:  None  Discharge Exam: BP (!) 150/58 (BP Location: Right Arm)   Pulse 66   Temp 97.9 F (36.6 C) (Oral)   Resp 17   Ht _0  (1.676 m)   Wt 114.1 kg (251 lb 9.6 oz)   SpO2 98%   BMI 40.61 kg/m   General:  56 year olf CF obese sitting up in bed in NAD Cardiovascular: RRR with no murmurs rubs or gallops  Respiratory: CTA with no wheezes or rales  Discharge Instructions You were cared for by a hospitalist during your hospital stay. If you have any questions about your discharge medications or the care you received while you were in the hospital after you are discharged, you can call the unit and asked to speak with the hospitalist on call if the hospitalist that took care of you is not available. Once you are discharged, your primary care physician will handle any further medical issues. Please note that NO REFILLS for any discharge medications will be authorized once you are discharged, as it is imperative that you return to your primary care physician (or establish a relationship with a primary care physician if you do not have one) for your aftercare needs so that they can reassess your need for medications and monitor your lab values.   Allergies as of 08/15/2017      Reactions   Methotrexate Other (See Comments)   Peri-oral and buccal lesions   Vancomycin Other (See Comments)   Nephrotoxicity   Clindamycin/lincomycin Nausea And Vomiting, Rash   Chlorhexidine Itching   Doxycycline Rash   Lisinopril Cough   Teflaro [ceftaroline] Rash      Medication List    STOP taking these medications   KLOR-CON M20 20 MEQ tablet Generic drug:  potassium chloride SA   levofloxacin 500 MG tablet Commonly known as:  LEVAQUIN   losartan 50 MG tablet Commonly known as:  COZAAR     TAKE these medications   albuterol 108 (90 Base) MCG/ACT inhaler Commonly known as:  PROVENTIL HFA;VENTOLIN HFA Inhale 1-2 puffs into the lungs every 6 (six) hours as needed for wheezing or shortness of breath. What changed:  Another medication with the same name was removed. Continue taking this medication, and follow the directions you see here.   allopurinol 100 MG tablet Commonly known as:  ZYLOPRIM Take 100 mg by  mouth at bedtime.   aspirin EC 81 MG tablet Take 81 mg by mouth daily.   atorvastatin 40 MG tablet Commonly known as:  LIPITOR TAKE 1 TAB BY MOUTH ONCE DAILY   azithromycin 250 MG tablet Commonly known as:  ZITHROMAX Z-PAK Take 2 tablets (500 mg) on  Day 1,  followed by 1 tablet (250 mg) once daily on Days 2 through 5.   BAYER CONTOUR MONITOR w/Device Kit Use daily to check sugar   BAYER MICROLET LANCETS lancets Use as instructed to check sugar 3 times daily   BRISDELLE 7.5 MG Caps Generic drug:  PARoxetine Mesylate Take 7.5 mg by mouth daily.   buPROPion 300 MG 24 hr tablet Commonly known as:  WELLBUTRIN XL TAKE 1 TABLET (300 MG TOTAL) BY MOUTH DAILY.   carvedilol 25 MG tablet Commonly known as:  COREG Take 1 tablet (25 mg total) by mouth 2 (two) times daily with a meal.   ciprofloxacin 500 MG tablet Commonly known as:  CIPRO Take 1 tablet (500 mg total) 2 (two)  times daily for 7 days by mouth.   dexlansoprazole 60 MG capsule Commonly known as:  DEXILANT Take 1 capsule (60 mg total) by mouth daily.   diclofenac sodium 1 % Gel Commonly known as:  VOLTAREN APPLY 2 GRAMS TOPICALLY 4 TIMES A DAY What changed:  See the new instructions.   diltiazem 120 MG 24 hr capsule Commonly known as:  CARDIZEM CD Take 1 capsule (120 mg total) by mouth daily.   estradiol 2 MG tablet Commonly known as:  ESTRACE Take 2 mg by mouth at bedtime.   fenofibrate 145 MG tablet Commonly known as:  TRICOR TAKE 1 TABLET BY MOUTH DAILY   fluticasone 50 MCG/ACT nasal spray Commonly known as:  FLONASE Place 2 sprays into both nostrils daily.   furosemide 40 MG tablet Commonly known as:  LASIX TAKE 1 TABLET BY MOUTH TWICE A DAY What changed:    how much to take  how to take this  when to take this   gabapentin 100 MG capsule Commonly known as:  NEURONTIN TAKE 1 CAPSULE BY MOUTH THREE TIMES A DAY   glucose blood test strip Commonly known as:  BAYER CONTOUR TEST Use as  instructed to check sugar 3 times daily   insulin aspart 100 UNIT/ML FlexPen Commonly known as:  NOVOLOG FLEXPEN Inject 22-27 Units into the skin 3 (three) times daily with meals.   Insulin Glargine 300 UNIT/ML Sopn Commonly known as:  TOUJEO MAX SOLOSTAR Inject 45 Units into the skin at bedtime.   Insulin Pen Needle 33G X 4 MM Misc 1 each by Does not apply route 4 (four) times daily.   LORazepam 0.5 MG tablet Commonly known as:  ATIVAN Take 0.5 mg by mouth every 12 (twelve) hours as needed for anxiety.   metFORMIN 1000 MG tablet Commonly known as:  GLUCOPHAGE Take 1 tablet (1,000 mg total) by mouth 2 (two) times daily with a meal.   mometasone-formoterol 200-5 MCG/ACT Aero Commonly known as:  DULERA Inhale 2 puffs into the lungs 2 (two) times daily.   nitroGLYCERIN 0.4 MG SL tablet Commonly known as:  NITROSTAT Place 1 tablet (0.4 mg total) under the tongue every 5 (five) minutes as needed for chest pain. Reported on 01/05/2016   orlistat 120 MG capsule Commonly known as:  XENICAL Take 1 capsule (120 mg total) by mouth 3 (three) times daily with meals.   oxyCODONE-acetaminophen 5-325 MG tablet Commonly known as:  ROXICET Take 1 tablet by mouth every 4 (four) hours as needed for severe pain.   Potassium Chloride ER 20 MEQ Tbcr Take 1 tablet by mouth 3 (three) times daily.   predniSONE 5 MG tablet Commonly known as:  DELTASONE Take 5 mg by mouth daily with breakfast.   traMADol 50 MG tablet Commonly known as:  ULTRAM Take 1 tablet (50 mg total) by mouth every 12 (twelve) hours as needed for moderate pain or severe pain.   venlafaxine XR 75 MG 24 hr capsule Commonly known as:  EFFEXOR-XR Take 2 capsules (150 mg total) by mouth at bedtime.   vitamin B-12 1000 MCG tablet Commonly known as:  CYANOCOBALAMIN Take 1,000 mcg by mouth daily.      Allergies  Allergen Reactions  . Methotrexate Other (See Comments)    Peri-oral and buccal lesions  . Vancomycin Other  (See Comments)    Nephrotoxicity   . Clindamycin/Lincomycin Nausea And Vomiting and Rash  . Chlorhexidine Itching  . Doxycycline Rash  . Lisinopril Cough  . Teflaro [  Ceftaroline] Rash   Follow-up Information    [name], MD Follow up.   Specialty:  Family Medicine Contact information: [address] [place] [address] [phone]            The results of significant diagnostics from this hospitalization (including imaging, microbiology, ancillary and laboratory) are listed below for reference.    Significant Diagnostic Studies: Dg Chest 2 View  Result Date: 08/12/2017 CLINICAL DATA:  Nonproductive cough x1 week. EXAM: CHEST  2 VIEW COMPARISON:  03/09/2017 CXR FINDINGS: The heart size and mediastinal contours are within normal limits. Both lungs are clear. The visualized skeletal structures are unremarkable. IMPRESSION: No active cardiopulmonary disease. Electronically Signed   By: Ashley Royalty M.D.   On: 08/12/2017 19:39    Microbiology: Recent Results (from the past 240 hour(s))  Culture, blood (x 2)     Status: None (Preliminary result)   Collection Time: 08/13/17 12:43 AM  Result Value Ref Range Status   Specimen Description BLOOD LEFT ANTECUBITAL  Final   Special Requests   Final    BOTTLES DRAWN AEROBIC AND ANAEROBIC Blood Culture results may not be optimal due to an excessive volume of blood received in culture bottles   Culture NO GROWTH 2 DAYS  Final   Report Status PENDING  Incomplete  Culture, blood (x 2)     Status: None (Preliminary result)   Collection Time: 08/13/17 12:47 AM  Result Value Ref Range Status   Specimen Description BLOOD LEFT HAND  Final   Special Requests   Final    BOTTLES DRAWN AEROBIC ONLY Blood Culture results may not be optimal due to an excessive volume of blood received in culture bottles   Culture NO GROWTH 2 DAYS  Final   Report Status PENDING  Incomplete  MRSA PCR Screening     Status: Abnormal   Collection  Time: 08/13/17  3:07 AM  Result Value Ref Range Status   MRSA by PCR POSITIVE (A) NEGATIVE Final    Comment:        The GeneXpert MRSA Assay (FDA approved for NASAL specimens only), is one component of a comprehensive MRSA colonization surveillance program. It is not intended to diagnose MRSA infection nor to guide or monitor treatment for MRSA infections. RESULT CALLED TO, READ BACK BY AND VERIFIED WITH: E.MCAULEY RN AT 0912 08/23/17 BY A.DAVIS   Respiratory Panel by PCR     Status: None   Collection Time: 08/13/17  3:45 AM  Result Value Ref Range Status   Adenovirus NOT DETECTED NOT DETECTED Final   Coronavirus 229E NOT DETECTED NOT DETECTED Final   Coronavirus HKU1 NOT DETECTED NOT DETECTED Final   Coronavirus NL63 NOT DETECTED NOT DETECTED Final   Coronavirus OC43 NOT DETECTED NOT DETECTED Final   Metapneumovirus NOT DETECTED NOT DETECTED Final   Rhinovirus / Enterovirus NOT DETECTED NOT DETECTED Final   Influenza A NOT DETECTED NOT DETECTED Final   Influenza B NOT DETECTED NOT DETECTED Final   Parainfluenza Virus 1 NOT DETECTED NOT DETECTED Final   Parainfluenza Virus 2 NOT DETECTED NOT DETECTED Final   Parainfluenza Virus 3 NOT DETECTED NOT DETECTED Final   Parainfluenza Virus 4 NOT DETECTED NOT DETECTED Final   Respiratory Syncytial Virus NOT DETECTED NOT DETECTED Final   Bordetella pertussis NOT DETECTED NOT DETECTED Final   Chlamydophila pneumoniae NOT DETECTED NOT DETECTED Final   Mycoplasma pneumoniae NOT DETECTED NOT DETECTED Final  Culture, Urine     Status: None  Collection Time: 08/13/17  9:11 PM  Result Value Ref Range Status   Specimen Description URINE, CLEAN CATCH  Final   Special Requests NONE  Final   Culture NO GROWTH  Final   Report Status 08/15/2017 FINAL  Final  Culture, sputum-assessment     Status: None   Collection Time: 08/14/17  7:55 PM  Result Value Ref Range Status   Specimen Description SPUTUM  Final   Special Requests NONE  Final    Sputum evaluation THIS SPECIMEN IS ACCEPTABLE FOR SPUTUM CULTURE  Final   Report Status 08/15/2017 FINAL  Final  Culture, respiratory (NON-Expectorated)     Status: None (Preliminary result)   Collection Time: 08/14/17  7:55 PM  Result Value Ref Range Status   Specimen Description SPUTUM  Final   Special Requests NONE Reflexed from [id number]  Final   Gram Stain   Final    MODERATE WBC PRESENT, PREDOMINANTLY PMN MODERATE BUDDING YEAST SEEN    Culture PENDING  Incomplete   Report Status PENDING  Incomplete     Labs: Basic Metabolic Panel: Recent Labs  Lab 08/12/17 1901 08/14/17 0415 08/15/17 0520  NA 134* 135 135  K 3.9 4.4 4.5  CL 96* 99* 100*  CO2 _0 GLUCOSE 238* 346* 326*  BUN 24* 16 19  CREATININE 1.23* 0.85 0.86  CALCIUM 9.3 9.0 8.8*   Liver Function Tests: Recent Labs  Lab 08/12/17 1901  AST 22  ALT 19  ALKPHOS 90  BILITOT 0.4  PROT 7.1  ALBUMIN 4.1   No results for input(s): LIPASE, AMYLASE in the last 168 hours. No results for input(s): AMMONIA in the last 168 hours. CBC: Recent Labs  Lab 08/12/17 1901 08/14/17 0415 08/15/17 0520  WBC 24.5* 15.7* 13.4*  NEUTROABS 17.9*  --   --   HGB 11.5* 9.5* 9.5*  HCT 36.7 30.9* 30.9*  MCV 85.0 85.8 85.1  PLT 519* 416* 411*   Cardiac Enzymes: Recent Labs  Lab 08/15/17 1100  TROPONINI <0.03   BNP: BNP (last 3 results) Recent Labs    03/09/17 1556 08/12/17 1901  BNP 47.6 13.1    ProBNP (last 3 results) No results for input(s): PROBNP in the last 8760 hours.  CBG: Recent Labs  Lab 08/14/17 1756 08/14/17 2211 08/15/17 0752 08/15/17 1140 08/15/17 1419  GLUCAP 277* 436* 335* 371* 373*       Signed:  Kayleen Memos, MD [facility] 08/15/2017, 2:47 PM

## 2017-08-17 DIAGNOSIS — M25661 Stiffness of right knee, not elsewhere classified: Secondary | ICD-10-CM | POA: Diagnosis not present

## 2017-08-17 DIAGNOSIS — M25461 Effusion, right knee: Secondary | ICD-10-CM | POA: Diagnosis not present

## 2017-08-17 DIAGNOSIS — R269 Unspecified abnormalities of gait and mobility: Secondary | ICD-10-CM | POA: Diagnosis not present

## 2017-08-17 DIAGNOSIS — M25561 Pain in right knee: Secondary | ICD-10-CM | POA: Diagnosis not present

## 2017-08-18 LAB — CULTURE, BLOOD (ROUTINE X 2)
Culture: NO GROWTH
Culture: NO GROWTH

## 2017-08-19 LAB — CULTURE, RESPIRATORY W GRAM STAIN

## 2017-08-22 ENCOUNTER — Ambulatory Visit (INDEPENDENT_AMBULATORY_CARE_PROVIDER_SITE_OTHER): Payer: BLUE CROSS/BLUE SHIELD | Admitting: Family Medicine

## 2017-08-22 ENCOUNTER — Encounter: Payer: Self-pay | Admitting: Family Medicine

## 2017-08-22 VITALS — BP 128/76 | HR 82 | Temp 98.3°F | Ht 66.0 in | Wt 248.2 lb

## 2017-08-22 DIAGNOSIS — I5032 Chronic diastolic (congestive) heart failure: Secondary | ICD-10-CM

## 2017-08-22 DIAGNOSIS — D860 Sarcoidosis of lung: Secondary | ICD-10-CM

## 2017-08-22 DIAGNOSIS — R350 Frequency of micturition: Secondary | ICD-10-CM | POA: Diagnosis not present

## 2017-08-22 DIAGNOSIS — I10 Essential (primary) hypertension: Secondary | ICD-10-CM | POA: Diagnosis not present

## 2017-08-22 DIAGNOSIS — I110 Hypertensive heart disease with heart failure: Secondary | ICD-10-CM

## 2017-08-22 MED ORDER — PREDNISONE 20 MG PO TABS: ORAL_TABLET | ORAL | 0 refills | Status: DC

## 2017-08-22 NOTE — Progress Notes (Signed)
Subjective:  [name] is a 56 y.o. year old very pleasant female patient who presents for/with See problem oriented charting ROS- continued intermittent wheeze responsive to albuterol. No chest pain. Does have fatigue and increase from baseline shortness of breath   Past Medical History-  Patient Active Problem List   Diagnosis Date Noted  . Obstructive chronic bronchitis without exacerbation ([place]) 09/18/2013    Priority: High  . Chest pain 04/11/2013    Priority: High  . Hypertensive heart disease with chronic diastolic congestive heart failure ([place])     Priority: High  . Sarcoidosis of lung ([place]) 04/10/2007    Priority: High  . Type II diabetes mellitus with renal manifestations ([place]) 08/21/2006    Priority: High  . Osteoarthritis of right knee 01/26/2017    Priority: Medium  . CKD (chronic kidney disease) stage 3, GFR 30-59 ml/min (HCC) 04/27/2015    Priority: Medium  . Fatty liver 09/30/2014    Priority: Medium  . Depression     Priority: Medium  . Gout 08/20/2010    Priority: Medium  . Anemia 09/18/2009    Priority: Medium  . Sleep apnea 04/21/2009    Priority: Medium  . Hyperlipidemia 08/21/2006    Priority: Medium  . Essential hypertension 08/21/2006    Priority: Medium  . Onychomycosis 10/27/2015    Priority: Low  . Hot flashes 07/15/2014    Priority: Low  . Abnormal SPEP 04/17/2014    Priority: Low  . Fracture of left leg 04/17/2014    Priority: Low  . Cushingoid side effect of steroids ([place]) 04/17/2014    Priority: Low  . Internal hemorrhoids     Priority: Low  . Preoperative clearance 03/25/2014    Priority: Low  . Solitary pulmonary nodule, on CT 02/2013 - stable over 2 years in 2015 02/20/2013    Priority: Low  . Morbid obesity ([place]) 06/04/2009    Priority: Low  . GERD 08/21/2006    Priority: Low  . Chronic diastolic CHF (congestive heart failure) ([place]) 08/13/2017  . UTI (urinary tract infection) 08/12/2017  . Sepsis ([place]) 08/12/2017  .  Plantar fasciitis, right 07/13/2017  . S/P total knee arthroplasty, right 06/29/2017  . Osteoarthrosis, localized, primary, knee, right   . Sprain of calcaneofibular ligament of right ankle 06/06/2017  . Acute left ankle pain 01/26/2017  . Multiple allergies 10/14/2016  . Osteomyelitis of left foot ([place]) 05/29/2015  . MRSA (methicillin resistant staph aureus) culture positive 03/27/2015  . Wound infection complicating hardware ([place]) 03/27/2015  . COPD with acute exacerbation ([place]) 03/14/2014  . COPD exacerbation ([place]) 12/02/2013  . Diabetes mellitus type 2, uncontrolled ([place]) 08/21/2006    Medications- reviewed and updated Current Outpatient Medications  Medication Sig Dispense Refill  . albuterol (PROVENTIL HFA;VENTOLIN HFA) 108 (90 Base) MCG/ACT inhaler Inhale 1-2 puffs into the lungs every 6 (six) hours as needed for wheezing or shortness of breath. 8 g 5  . allopurinol (ZYLOPRIM) 100 MG tablet Take 100 mg by mouth at bedtime.     [name] aspirin EC 81 MG tablet Take 81 mg by mouth daily.     [name] atorvastatin (LIPITOR) 40 MG tablet TAKE 1 TAB BY MOUTH ONCE DAILY 90 tablet 3  . BAYER MICROLET LANCETS lancets Use as instructed to check sugar 3 times daily 300 each 5  . Blood Glucose Monitoring Suppl (BAYER CONTOUR MONITOR) w/Device KIT Use daily to check sugar 1 kit 0  . buPROPion (WELLBUTRIN XL) 300 MG 24 hr tablet TAKE  1 TABLET (300 MG TOTAL) BY MOUTH DAILY. 90 tablet 3  . carvedilol (COREG) 25 MG tablet Take 1 tablet (25 mg total) by mouth 2 (two) times daily with a meal. 180 tablet 3  . ciprofloxacin (CIPRO) 500 MG tablet Take 1 tablet (500 mg total) 2 (two) times daily for 7 days by mouth. 14 tablet 0  . dexlansoprazole (DEXILANT) 60 MG capsule Take 1 capsule (60 mg total) by mouth daily. 30 capsule 1  . diclofenac sodium (VOLTAREN) 1 % GEL APPLY 2 GRAMS TOPICALLY 4 TIMES A DAY (Patient taking differently: APPLY 2 GRAMS TOPICALLY 4 TIMES A DAY AS NEEDED FOR KNEE PAIN) 100 g 0  . diltiazem  (CARDIZEM CD) 120 MG 24 hr capsule Take 1 capsule (120 mg total) by mouth daily. 90 capsule 3  . fenofibrate (TRICOR) 145 MG tablet TAKE 1 TABLET BY MOUTH DAILY 30 tablet 4  . fluticasone (FLONASE) 50 MCG/ACT nasal spray Place 2 sprays into both nostrils daily. 48 g 3  . furosemide (LASIX) 40 MG tablet TAKE 1 TABLET BY MOUTH TWICE A DAY (Patient taking differently: TAKE 1 TABLET (40 MG) BY MOUTH TWICE A DAY) 60 tablet 11  . gabapentin (NEURONTIN) 100 MG capsule TAKE 1 CAPSULE BY MOUTH THREE TIMES A DAY 90 capsule 0  . glucose blood (BAYER CONTOUR TEST) test strip Use as instructed to check sugar 3 times daily 300 each 5  . insulin aspart (NOVOLOG FLEXPEN) 100 UNIT/ML FlexPen Inject 22-27 Units into the skin 3 (three) times daily with meals. 30 mL 5  . Insulin Glargine (TOUJEO MAX SOLOSTAR) 300 UNIT/ML SOPN Inject 45 Units into the skin at bedtime. 3 pen 11  . Insulin Pen Needle 33G X 4 MM MISC 1 each by Does not apply route 4 (four) times daily. 200 each 11  . LORazepam (ATIVAN) 0.5 MG tablet Take 0.5 mg by mouth every 12 (twelve) hours as needed for anxiety.    . metFORMIN (GLUCOPHAGE) 1000 MG tablet Take 1 tablet (1,000 mg total) by mouth 2 (two) times daily with a meal. 180 tablet 0  . mometasone-formoterol (DULERA) 200-5 MCG/ACT AERO Inhale 2 puffs into the lungs 2 (two) times daily. 13 g 5  . nitroGLYCERIN (NITROSTAT) 0.4 MG SL tablet Place 1 tablet (0.4 mg total) under the tongue every 5 (five) minutes as needed for chest pain. Reported on 01/05/2016 100 tablet 0  . orlistat (XENICAL) 120 MG capsule Take 1 capsule (120 mg total) by mouth 3 (three) times daily with meals. 90 capsule 2  . oxyCODONE-acetaminophen (ROXICET) 5-325 MG tablet Take 1 tablet by mouth every 4 (four) hours as needed for severe pain. 60 tablet 0  . PARoxetine Mesylate (BRISDELLE) 7.5 MG CAPS Take 7.5 mg by mouth daily.    . Potassium Chloride ER 20 MEQ TBCR Take 1 tablet by mouth 3 (three) times daily. 90 tablet 3  .  predniSONE (DELTASONE) 5 MG tablet Take 5 mg by mouth daily with breakfast.    . traMADol (ULTRAM) 50 MG tablet Take 1 tablet (50 mg total) by mouth every 12 (twelve) hours as needed for moderate pain or severe pain. 30 tablet 2  . venlafaxine XR (EFFEXOR-XR) 75 MG 24 hr capsule Take 2 capsules (150 mg total) by mouth at bedtime.    . vitamin B-12 (CYANOCOBALAMIN) 1000 MCG tablet Take 1,000 mcg by mouth daily.    [name] estradiol (ESTRACE) 2 MG tablet Take 2 mg by mouth at bedtime.  No current facility-administered medications for this visit.     Objective: BP 128/76   Pulse 82   Temp 98.3 F (36.8 C) (Oral)   Ht 5' 6" (1.676 m)   Wt 248 lb 3.2 oz (112.6 kg)   SpO2 97%   BMI 40.06 kg/m  Gen: NAD, resting comfortably CV: RRR no murmurs rubs or gallops Lungs: diffuse intermittent wheeze. No crackles or rhonchi Abdomen: soft/nontender/nondistended/normal bowel sounds.  Ext: no edema, no calf pain or swelling Skin: warm, dry, no rash  Assessment/Plan:  Urinary frequency - Plan: Urine Culture S: patient continues to have some urinary frequency. She is still on 7 days of cipro after3 days of meropenem with ESBL Klebsiella Pneumonia found in hospital.  A/P: update urine culture today- hopeful for complete clearance especially with resistant strain.   Sarcoidosis of lung (HCC) Diastolic CHF- appears controlled S: Patient was discharged with listed copd exacerbation but she does not carry a diagnosis of COPD. Was on meropenem in hospital. From hospital- Finished azihtromycin last week- didn't help much. Prednisone helped In hospital. Back to normal 37m prednisone- finished 513m5 days ago and symptoms have worsened since then- coughing increase as well as fatigue. When wheezes will get some chest tightness which responds to albuterol. Overall just does not feel as well as she would hope. Dizzy at times. Compliant with lasix- no increased edema. Weight is actually down from last office  visit A/P: [facility]. She usually jumps back much quicker after illnesses and hospitalization and lack of breathing improvement concerns me- will do another 5 days of prednisone [facility] advised q6 albuterol since that has been helping her. She does not appear fluid overloaded- doubt CHF exacerbation. No calf tenderness or swelling to suggest potential DVT- would suspect this before potential PE. Discussed importance of follow up if not improving  Essential hypertension S: controlled on Cartia XT 12023mcarvedilol 25 mg BID, lasix 40m65mD. In hospital due to potential contribution of chronic cough-  losartan50mg64m stopped  BP Readings from Last 3 Encounters:  08/22/17 128/76  08/15/17 (!) 148/59  08/05/17 130/80  A/P: We discussed blood pressure goal of <140/90. Continue current meds:  Remain off losartan with controlled BP today but will monitor and may restart later date if needed   Future Appointments  Date Time Provider Depar[place]19/2018  8:00 AM EllisRenato ShinLBPC-LBENDO None  08/29/2017  9:30 AM Duda,Newt MinionPO-NW None   Orders Placed This Encounter  Procedures  . Urine Culture    [facility]    Meds ordered this encounter  Medications  . predniSONE (DELTASONE) 20 MG tablet    Sig: Take 2 pills for 5 days    Dispense:  10 tablet    Refill:  0    Return precautions advised.  StephGarret Reddish

## 2017-08-22 NOTE — Patient Instructions (Addendum)
Call [facility] to see if they can see you this week  Until you get in- 5 day boost of prednisone again.   Check urine culture before you leave  Blood pressure looks better on repeat- can stay off losartan for now. If high at [facility] I could always send in irbesartan or you could come here for repeat before we start this  I would do albuterol either nebulizer or inhaler every 6 hours as well until you get to see [facility]

## 2017-08-22 NOTE — Assessment & Plan Note (Signed)
S: controlled on Cartia XT 120mg , carvedilol 25 mg BID, lasix 40mg  BID. In hospital due to potential contribution of chronic cough-  losartan50mg  was stopped  BP Readings from Last 3 Encounters:  08/22/17 128/76  08/15/17 (!) 148/59  08/05/17 130/80  A/P: We discussed blood pressure goal of <140/90. Continue current meds:  Remain off losartan with controlled BP today but will monitor and may restart later date if needed

## 2017-08-23 LAB — URINE CULTURE
MICRO NUMBER:: 81271725
SPECIMEN QUALITY:: ADEQUATE

## 2017-08-24 DIAGNOSIS — M25661 Stiffness of right knee, not elsewhere classified: Secondary | ICD-10-CM | POA: Diagnosis not present

## 2017-08-24 DIAGNOSIS — M25461 Effusion, right knee: Secondary | ICD-10-CM | POA: Diagnosis not present

## 2017-08-24 DIAGNOSIS — M25561 Pain in right knee: Secondary | ICD-10-CM | POA: Diagnosis not present

## 2017-08-24 DIAGNOSIS — R269 Unspecified abnormalities of gait and mobility: Secondary | ICD-10-CM | POA: Diagnosis not present

## 2017-08-26 DIAGNOSIS — R269 Unspecified abnormalities of gait and mobility: Secondary | ICD-10-CM | POA: Diagnosis not present

## 2017-08-26 DIAGNOSIS — M25661 Stiffness of right knee, not elsewhere classified: Secondary | ICD-10-CM | POA: Diagnosis not present

## 2017-08-26 DIAGNOSIS — M25561 Pain in right knee: Secondary | ICD-10-CM | POA: Diagnosis not present

## 2017-08-26 DIAGNOSIS — M25461 Effusion, right knee: Secondary | ICD-10-CM | POA: Diagnosis not present

## 2017-08-29 ENCOUNTER — Ambulatory Visit (INDEPENDENT_AMBULATORY_CARE_PROVIDER_SITE_OTHER): Payer: BLUE CROSS/BLUE SHIELD | Admitting: Orthopedic Surgery

## 2017-08-29 ENCOUNTER — Ambulatory Visit (INDEPENDENT_AMBULATORY_CARE_PROVIDER_SITE_OTHER): Payer: BLUE CROSS/BLUE SHIELD | Admitting: Endocrinology

## 2017-08-29 ENCOUNTER — Encounter (INDEPENDENT_AMBULATORY_CARE_PROVIDER_SITE_OTHER): Payer: Self-pay | Admitting: Orthopedic Surgery

## 2017-08-29 ENCOUNTER — Encounter: Payer: Self-pay | Admitting: Endocrinology

## 2017-08-29 VITALS — BP 160/78 | HR 82 | Wt 251.2 lb

## 2017-08-29 DIAGNOSIS — E1129 Type 2 diabetes mellitus with other diabetic kidney complication: Secondary | ICD-10-CM | POA: Diagnosis not present

## 2017-08-29 DIAGNOSIS — Z794 Long term (current) use of insulin: Secondary | ICD-10-CM

## 2017-08-29 DIAGNOSIS — Z96651 Presence of right artificial knee joint: Secondary | ICD-10-CM

## 2017-08-29 MED ORDER — INSULIN GLARGINE 300 UNIT/ML ~~LOC~~ SOPN: 40.0000 [IU] | PEN_INJECTOR | Freq: Every day | SUBCUTANEOUS | 11 refills | Status: DC

## 2017-08-29 MED ORDER — INSULIN ASPART 100 UNIT/ML FLEXPEN: 25.0000 [IU] | PEN_INJECTOR | Freq: Three times a day (TID) | SUBCUTANEOUS | 11 refills | Status: DC

## 2017-08-29 NOTE — Patient Instructions (Signed)
check your blood sugar 3 times a day.  vary the time of day when you check, between before the 3 meals, and at bedtime.  also check if you have symptoms of your blood sugar being too high or too low.  please keep a record of the readings and bring it to your next appointment here (or you can bring the meter itself).  You can write it on any piece of paper.  please call us sooner if your blood sugar goes below 70, or if you have a lot of readings over 200. Please increase the novolog to 25-30 units 3 times a day (just before each meal), and: Please continue the same metformin and toujeo.  Please come back for a follow-up appointment in 3 months.

## 2017-08-29 NOTE — Progress Notes (Signed)
Subjective:    Patient ID: [name], female    DOB: October 10, 1961, 56 y.o.   MRN: [id number]  HPI Pt returns for f/u of diabetes mellitus: DM type: Insulin-requiring type 2 Dx'ed: 0867 Complications: polyneuropathy, renal insuff, and mild CAD Therapy: insulin since 2006, and 2 oral meds.  GDM: never DKA: never Severe hypoglycemia: never Pancreatitis: never Pancreatic imaging: CT (2015) showed fatty atrophy.  Other: she takes multiple daily injections; she intermittently takes prednisone for sarcoidosis.  Interval history: she will have right TKR soon.  She says cbg's vary from 100-200.  It is in general highest at lunch.  Past Medical History:  Diagnosis Date  . Abnormal SPEP 04/17/2014  . Acute left ankle pain 01/26/2017  . ANEMIA-UNSPECIFIED 09/18/2009  . CHF (congestive heart failure) ([place])   . Chronic diastolic heart failure, NYHA class 2 (HCC)    LVEDP roughly 20% by cath  . COPD (chronic obstructive pulmonary disease) ([place])   . Depression   . DIABETES MELLITUS, TYPE II 08/21/2006  . Diabetic osteomyelitis ([place]) 05/29/2015  . Fracture of 5th metatarsal    non union  . GERD 08/21/2006  . GOUT 08/20/2010  . Hx of umbilical hernia repair   . HYPERLIPIDEMIA 08/21/2006  . HYPERTENSION 08/21/2006  . Infection of wound due to methicillin resistant Staphylococcus aureus (MRSA)   . Internal hemorrhoids   . Multiple allergies 10/14/2016  . OBESITY 06/04/2009  . Onychomycosis 10/27/2015  . Osteomyelitis of left foot ([place]) 05/29/2015  . Pulmonary sarcoidosis ([place])    Followed locally by pulmonology, but also by Dr. Casper Harrison at [facility]  . Right knee pain 01/26/2017  . Vocal cord dysfunction   . Wears partial dentures     Past Surgical History:  Procedure Laterality Date  . ABDOMINAL HYSTERECTOMY    . APPENDECTOMY    . CARDIAC CATHETERIZATION  07/2010   LVEF 50-55% WITH VERY MILD GLOBAL HYPOKINESIA; ESSENTIALLY NORMAL CORONARY ARTERIES; NORMAL LV FUNCTION  .  CAROTIDS  02/18/11   CAROTID DUPLEX; VERTEBRALS ARE PATENT WITH ANTEGRADE FLOW. ICA/CCA RATIO 1.61 ON RIGHT AND 0.75 ON LEFT  . CHOLECYSTECTOMY  1984  . CYSTOSCOPY N/A 11/11/2011   Performed by Olga Millers, MD at [facility] ORS  . DOPPLER ECHOCARDIOGRAPHY  02/12/2013   LV FUNCTION, SIZE NORMAL; MILD CONCENTRIC LVH; EST EF 55-65%; WALL MOTION NORMAL  . FRACTURE SURGERY     foot  . HERNIA REPAIR    . KNEE ARTHROSCOPY     right  . LEFT AND RIGHT HEART CATHETERIZATION WITH CORONARY ANGIOGRAM N/A 04/23/2013   Performed by Leonie Man, MD at [facility] CATH LAB  . LEFT FOOT FRACTURE OPEN TREATMENT METATARSAL INCLUDES INTERNAL FIXATION EACH Left 04/09/2014   Performed by Lorn Junes, MD at [facility]  . Left Heart Cath and Coronary Angiography N/A 03/11/2017   Performed by Sherren Mocha, MD at [place] CV LAB  . LexiScan Myoview  03/09/2013   EF 50%; NORMAL MYOCARDIAL PERFUSION STUDY - breast attenuation  . NISSEN FUNDOPLICATION  6195  . Partial Excision Left Calcaneus, Place Antibiotic Beads, and Wound VAC Left 06/27/2015   Performed by Newt Minion, MD at [place]  . Right and left CARDIAC CATHETERIZATION  04/23/2013   Angiographic normal coronaries; LVEDP 20 mmHg, PCWP 12-14 mmHg, RAP 12 mmHg.; Fick CO/CI 4.9/2.2  . RIGHT TOTAL KNEE ARTHROPLASTY Right 06/29/2017   Performed by Newt Minion, MD at [place]  . TRANSVAGINAL TAPE (TVT)  PROCEDURE N/A 11/11/2011   Performed by Olga Millers, MD at [facility] ORS  . TUBAL LIGATION     with reversal in 1994  . VENTRAL HERNIA REPAIR      Social History   Socioeconomic History  . Marital status: Married    Spouse name: LEONORA GORES  . Number of children: 2  . Years of education: 74  . Highest education level: Not on file  Social Needs  . Financial resource strain: Not on file  . Food insecurity - worry: Not on file  . Food insecurity - inability: Not on file  . Transportation needs - medical: Not on file  . Transportation needs -  non-medical: Not on file  Occupational History  . Occupation: DISABLED  Tobacco Use  . Smoking status: Never Smoker  . Smokeless tobacco: Never Used  Substance and Sexual Activity  . Alcohol use: No    Alcohol/week: 0.0 oz  . Drug use: No  . Sexual activity: Yes    Birth control/protection: Surgical  Other Topics Concern  . Not on file  Social History Narrative   Married 1994. 2 sons who both live close and 1 grandson.       Disability due to sarcoidosis. 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 albuterol (PROVENTIL HFA;VENTOLIN HFA) 108 (90 Base) MCG/ACT inhaler Inhale 1-2 puffs into the lungs every 6 (six) hours as needed for wheezing or shortness of breath. 8 g 5  . allopurinol (ZYLOPRIM) 100 MG tablet Take 100 mg by mouth at bedtime.     [name] aspirin EC 81 MG tablet Take 81 mg by mouth daily.     [name] atorvastatin (LIPITOR) 40 MG tablet TAKE 1 TAB BY MOUTH ONCE DAILY 90 tablet 3  . BAYER MICROLET LANCETS lancets Use as instructed to check sugar 3 times daily 300 each 5  . Blood Glucose Monitoring Suppl (BAYER CONTOUR MONITOR) w/Device KIT Use daily to check sugar 1 kit 0  . buPROPion (WELLBUTRIN XL) 300 MG 24 hr tablet TAKE 1 TABLET (300 MG TOTAL) BY MOUTH DAILY. 90 tablet 3  . carvedilol (COREG) 25 MG tablet Take 1 tablet (25 mg total) by mouth 2 (two) times daily with a meal. 180 tablet 3  . dexlansoprazole (DEXILANT) 60 MG capsule Take 1 capsule (60 mg total) by mouth daily. 30 capsule 1  . diclofenac sodium (VOLTAREN) 1 % GEL APPLY 2 GRAMS TOPICALLY 4 TIMES A DAY (Patient taking differently: APPLY 2 GRAMS TOPICALLY 4 TIMES A DAY AS NEEDED FOR KNEE PAIN) 100 g 0  . diltiazem (CARDIZEM CD) 120 MG 24 hr capsule Take 1 capsule (120 mg total) by mouth  daily. 90 capsule 3  . fenofibrate (TRICOR) 145 MG tablet TAKE 1 TABLET BY MOUTH DAILY 30 tablet 4  . fluticasone (FLONASE) 50 MCG/ACT nasal spray Place 2 sprays into both nostrils daily. 48 g 3  . furosemide (LASIX) 40 MG tablet TAKE 1 TABLET BY MOUTH TWICE A DAY (Patient taking differently: TAKE 1 TABLET (40 MG) BY MOUTH TWICE A DAY) 60 tablet 11  . gabapentin (NEURONTIN) 100 MG capsule TAKE 1 CAPSULE BY MOUTH THREE TIMES A DAY 90 capsule 0  . glucose blood (BAYER CONTOUR TEST) test strip Use as instructed  to check sugar 3 times daily 300 each 5  . insulin aspart (NOVOLOG FLEXPEN) 100 UNIT/ML FlexPen Inject 22-27 Units into the skin 3 (three) times daily with meals. 30 mL 5  . Insulin Glargine (TOUJEO MAX SOLOSTAR) 300 UNIT/ML SOPN Inject 45 Units into the skin at bedtime. 3 pen 11  . Insulin Pen Needle 33G X 4 MM MISC 1 each by Does not apply route 4 (four) times daily. 200 each 11  . LORazepam (ATIVAN) 0.5 MG tablet Take 0.5 mg by mouth every 12 (twelve) hours as needed for anxiety.    . metFORMIN (GLUCOPHAGE) 1000 MG tablet Take 1 tablet (1,000 mg total) by mouth 2 (two) times daily with a meal. 180 tablet 0  . mometasone-formoterol (DULERA) 200-5 MCG/ACT AERO Inhale 2 puffs into the lungs 2 (two) times daily. 13 g 5  . nitroGLYCERIN (NITROSTAT) 0.4 MG SL tablet Place 1 tablet (0.4 mg total) under the tongue every 5 (five) minutes as needed for chest pain. Reported on 01/05/2016 100 tablet 0  . orlistat (XENICAL) 120 MG capsule Take 1 capsule (120 mg total) by mouth 3 (three) times daily with meals. 90 capsule 2  . oxyCODONE-acetaminophen (ROXICET) 5-325 MG tablet Take 1 tablet by mouth every 4 (four) hours as needed for severe pain. 60 tablet 0  . PARoxetine Mesylate (BRISDELLE) 7.5 MG CAPS Take 7.5 mg by mouth daily.    . Potassium Chloride ER 20 MEQ TBCR Take 1 tablet by mouth 3 (three) times daily. 90 tablet 3  . predniSONE (DELTASONE) 20 MG tablet Take 2 pills for 5 days 10 tablet 0  .  traMADol (ULTRAM) 50 MG tablet Take 1 tablet (50 mg total) by mouth every 12 (twelve) hours as needed for moderate pain or severe pain. 30 tablet 2  . venlafaxine XR (EFFEXOR-XR) 75 MG 24 hr capsule Take 2 capsules (150 mg total) by mouth at bedtime.    . vitamin B-12 (CYANOCOBALAMIN) 1000 MCG tablet Take 1,000 mcg by mouth daily.     No current facility-administered medications on file prior to visit.     Allergies  Allergen Reactions  . Methotrexate Other (See Comments)    Peri-oral and buccal lesions  . Vancomycin Other (See Comments)    Nephrotoxicity   . Clindamycin/Lincomycin Nausea And Vomiting and Rash  . Chlorhexidine Itching  . Doxycycline Rash  . Lisinopril Cough  . Teflaro [Ceftaroline] Rash    Family History  Problem Relation Age of Onset  . Diabetes Father   . Heart attack Father 55  . Coronary artery disease Father   . Heart failure Father   . COPD Mother   . Emphysema Mother   . Asthma Mother   . Heart failure Mother   . Breast cancer Mother   . Heart attack Maternal Grandfather   . Sarcoidosis Maternal Uncle   . Lung cancer Brother   . Diabetes Brother   . Colon cancer Neg Hx     BP (!) 160/78 (BP Location: Left Arm, Patient Position: Sitting, Cuff Size: Normal)   Pulse 82   Wt 251 lb 3.2 oz (113.9 kg)   SpO2 97%   BMI 40.54 kg/m    Insulin-requiring type 2 DM, with CAD: she needs increased rx, if it can be done with a regimen that avoids or minimizes hypoglycemia. Renal failure: she should d/c glyburide.  Sarcoidosis: we'll have to adjust insulin if she goes back on prednisone.  OA: perioperatively, all she needs to do is to  hold insulin for any missed meal  She is now on a steroid taper, for COPD exacerbation.  no cbg record, but states cbg's have improved to the 100's.  Prior to the hospitalization, cbg's were in the low-100's.   Review of Systems She denies hypoglycemia    Objective:   Physical Exam VITAL SIGNS:  See vs page GENERAL:  no distress Pulses: foot pulses are intact bilaterally.   MSK: no deformity of the feet or ankles.  CV: no edema of the legs or ankles Skin:  no ulcer on the feet or ankles.  normal color and temp on the feet and ankles.   Neuro: sensation is intact to touch on the feet and ankles, but decreased from normal.  Ext: There is bilateral onychomycosis of the toenails    Lab Results  Component Value Date   HGBA1C 7.8 (H) 08/13/2017      Assessment & Plan:  Insulin-requiring type 2 DM: she needs increased rx.   Patient Instructions  check your blood sugar 3 times a day.  vary the time of day when you check, between before the 3 meals, and at bedtime.  also check if you have symptoms of your blood sugar being too high or too low.  please keep a record of the readings and bring it to your next appointment here (or you can bring the meter itself).  You can write it on any piece of paper.  please call us sooner if your blood sugar goes below 70, or if you have a lot of readings over 200. Please increase the novolog to 25-30 units 3 times a day (just before each meal), and: Please continue the same metformin and toujeo.  Please come back for a follow-up appointment in 3 months.

## 2017-08-29 NOTE — Progress Notes (Signed)
Office Visit Note   Patient: [name]           Date of Birth: 1960-12-17           MRN: [id number] Visit Date: 08/29/2017              Requested by: [name], MD [place], [place] [address] PCP: [name], MD  Chief Complaint  Patient presents with  . Right Knee - Follow-up      HPI: Patient is a 56 year old woman who presents 2 months status post right total knee arthroplasty she is completed her therapy she states she will be joining a gym.  Assessment & Plan: Visit Diagnoses:  1. S/P total knee arthroplasty, right     Plan: Recommended continuing with exercises 2-3 times a week for strengthening as well as aerobic activities 2-3 times a week as well.  Patient states that she would like to follow-up at a later time to discuss knee replacement on the left.  Follow-Up Instructions: Return if symptoms worsen or fail to improve.   Ortho Exam  Patient is alert, oriented, no adenopathy, well-dressed, normal affect, normal respiratory effort. Examination incision is well-healed she has full extension there is no varus or valgus instability.  There is no swelling.  Imaging: No results found. No images are attached to the encounter.  Labs: Lab Results  Component Value Date   HGBA1C 7.8 (H) 08/13/2017   HGBA1C 7.5 (H) 06/17/2017   HGBA1C 8.9 (H) 03/09/2017   ESRSEDRATE 28 06/16/2017   ESRSEDRATE 5 01/26/2017   ESRSEDRATE 27 10/14/2016   CRP 27.1 (H) 06/16/2017   CRP 17.3 (H) 01/26/2017   CRP 24.0 (H) 10/14/2016   LABURIC 5.1 01/26/2017   REPTSTATUS 08/15/2017 FINAL 08/14/2017   REPTSTATUS 08/19/2017 FINAL 08/14/2017   GRAMSTAIN  08/14/2017    MODERATE WBC PRESENT, PREDOMINANTLY PMN MODERATE BUDDING YEAST SEEN    CULT  08/14/2017    MODERATE CANDIDA TROPICALIS FEW PSEUDOMONAS FLUORESCENS    LABORGA PSEUDOMONAS FLUORESCENS 08/14/2017    Orders:  No orders of the defined types were placed in this encounter.  No  orders of the defined types were placed in this encounter.    Procedures: No procedures performed  Clinical Data: No additional findings.  ROS:  All other systems negative, except as noted in the HPI. Review of Systems  Objective: Vital Signs: There were no vitals taken for this visit.  Specialty Comments:  No specialty comments available.  PMFS History: Patient Active Problem List   Diagnosis Date Noted  . Chronic diastolic CHF (congestive heart failure) ([place]) 08/13/2017  . UTI (urinary tract infection) 08/12/2017  . Sepsis ([place]) 08/12/2017  . Plantar fasciitis, right 07/13/2017  . S/P total knee arthroplasty, right 06/29/2017  . Osteoarthrosis, localized, primary, knee, right   . Sprain of calcaneofibular ligament of right ankle 06/06/2017  . Acute left ankle pain 01/26/2017  . Osteoarthritis of right knee 01/26/2017  . Multiple allergies 10/14/2016  . Onychomycosis 10/27/2015  . Osteomyelitis of left foot ([place]) 05/29/2015  . CKD (chronic kidney disease) stage 3, GFR 30-59 ml/min (HCC) 04/27/2015  . MRSA (methicillin resistant staph aureus) culture positive 03/27/2015  . Wound infection complicating hardware ([place]) 03/27/2015  . Fatty liver 09/30/2014  . Hot flashes 07/15/2014  . Abnormal SPEP 04/17/2014  . Fracture of left leg 04/17/2014  . Cushingoid side effect of steroids ([place]) 04/17/2014  . Internal hemorrhoids   . Depression   . Preoperative  clearance 03/25/2014  . COPD with acute exacerbation ([place]) 03/14/2014  . COPD exacerbation ([place]) 12/02/2013  . Obstructive chronic bronchitis without exacerbation ([place]) 09/18/2013  . Chest pain 04/11/2013  . Hypertensive heart disease with chronic diastolic congestive heart failure ([place])   . Solitary pulmonary nodule, on CT 02/2013 - stable over 2 years in 2015 02/20/2013  . Gout 08/20/2010  . Anemia 09/18/2009  . Morbid obesity ([place]) 06/04/2009  . Sleep apnea 04/21/2009  . Sarcoidosis of lung ([place]) 04/10/2007  .  Hyperlipidemia 08/21/2006  . Essential hypertension 08/21/2006  . GERD 08/21/2006  . Type II diabetes mellitus with renal manifestations ([place]) 08/21/2006  . Diabetes mellitus type 2, uncontrolled ([place]) 08/21/2006   Past Medical History:  Diagnosis Date  . Abnormal SPEP 04/17/2014  . Acute left ankle pain 01/26/2017  . ANEMIA-UNSPECIFIED 09/18/2009  . CHF (congestive heart failure) ([place])   . Chronic diastolic heart failure, NYHA class 2 (HCC)    LVEDP roughly 20% by cath  . COPD (chronic obstructive pulmonary disease) ([place])   . Depression   . DIABETES MELLITUS, TYPE II 08/21/2006  . Diabetic osteomyelitis ([place]) 05/29/2015  . Fracture of 5th metatarsal    non union  . GERD 08/21/2006  . GOUT 08/20/2010  . Hx of umbilical hernia repair   . HYPERLIPIDEMIA 08/21/2006  . HYPERTENSION 08/21/2006  . Infection of wound due to methicillin resistant Staphylococcus aureus (MRSA)   . Internal hemorrhoids   . Multiple allergies 10/14/2016  . OBESITY 06/04/2009  . Onychomycosis 10/27/2015  . Osteomyelitis of left foot ([place]) 05/29/2015  . Pulmonary sarcoidosis ([place])    Followed locally by pulmonology, but also by Dr. Casper Harrison at [facility]  . Right knee pain 01/26/2017  . Vocal cord dysfunction   . Wears partial dentures     Family History  Problem Relation Age of Onset  . Diabetes Father   . Heart attack Father 39  . Coronary artery disease Father   . Heart failure Father   . COPD Mother   . Emphysema Mother   . Asthma Mother   . Heart failure Mother   . Breast cancer Mother   . Heart attack Maternal Grandfather   . Sarcoidosis Maternal Uncle   . Lung cancer Brother   . Diabetes Brother   . Colon cancer Neg Hx     Past Surgical History:  Procedure Laterality Date  . ABDOMINAL HYSTERECTOMY    . APPENDECTOMY    . CARDIAC CATHETERIZATION  07/2010   LVEF 50-55% WITH VERY MILD GLOBAL HYPOKINESIA; ESSENTIALLY NORMAL CORONARY ARTERIES; NORMAL LV FUNCTION  . CAROTIDS  02/18/11    CAROTID DUPLEX; VERTEBRALS ARE PATENT WITH ANTEGRADE FLOW. ICA/CCA RATIO 1.61 ON RIGHT AND 0.75 ON LEFT  . CHOLECYSTECTOMY  1984  . CYSTOSCOPY N/A 11/11/2011   Performed by Olga Millers, MD at [facility] ORS  . DOPPLER ECHOCARDIOGRAPHY  02/12/2013   LV FUNCTION, SIZE NORMAL; MILD CONCENTRIC LVH; EST EF 55-65%; WALL MOTION NORMAL  . FRACTURE SURGERY     foot  . HERNIA REPAIR    . KNEE ARTHROSCOPY     right  . LEFT AND RIGHT HEART CATHETERIZATION WITH CORONARY ANGIOGRAM N/A 04/23/2013   Performed by Leonie Man, MD at [facility] CATH LAB  . LEFT FOOT FRACTURE OPEN TREATMENT METATARSAL INCLUDES INTERNAL FIXATION EACH Left 04/09/2014   Performed by Lorn Junes, MD at [facility]  . Left Heart Cath and Coronary Angiography N/A 03/11/2017   Performed by  Sherren Mocha, MD at [facility] CV LAB  . LexiScan Myoview  03/09/2013   EF 50%; NORMAL MYOCARDIAL PERFUSION STUDY - breast attenuation  . NISSEN FUNDOPLICATION  5364  . Partial Excision Left Calcaneus, Place Antibiotic Beads, and Wound VAC Left 06/27/2015   Performed by Newt Minion, MD at [facility]  . Right and left CARDIAC CATHETERIZATION  04/23/2013   Angiographic normal coronaries; LVEDP 20 mmHg, PCWP 12-14 mmHg, RAP 12 mmHg.; Fick CO/CI 4.9/2.2  . RIGHT TOTAL KNEE ARTHROPLASTY Right 06/29/2017   Performed by Newt Minion, MD at [facility]  . TRANSVAGINAL TAPE (TVT) PROCEDURE N/A 11/11/2011   Performed by Olga Millers, MD at [facility] ORS  . TUBAL LIGATION     with reversal in 1994  . VENTRAL HERNIA REPAIR     Social History   Occupational History  . Occupation: DISABLED  Tobacco Use  . Smoking status: Never Smoker  . Smokeless tobacco: Never Used  Substance and Sexual Activity  . Alcohol use: No    Alcohol/week: 0.0 oz  . Drug use: No  . Sexual activity: Yes    Birth control/protection: Surgical

## 2017-08-30 ENCOUNTER — Ambulatory Visit: Payer: BLUE CROSS/BLUE SHIELD | Admitting: Family Medicine

## 2017-09-04 ENCOUNTER — Other Ambulatory Visit: Payer: Self-pay | Admitting: Endocrinology

## 2017-09-05 NOTE — Telephone Encounter (Signed)
This was accidentally refilled thru protocol, but is not prescribed by me.

## 2017-09-06 ENCOUNTER — Other Ambulatory Visit: Payer: Self-pay | Admitting: Endocrinology

## 2017-09-06 ENCOUNTER — Other Ambulatory Visit: Payer: Self-pay

## 2017-09-06 DIAGNOSIS — Z6841 Body Mass Index (BMI) 40.0 and over, adult: Secondary | ICD-10-CM | POA: Diagnosis not present

## 2017-09-06 DIAGNOSIS — D860 Sarcoidosis of lung: Secondary | ICD-10-CM | POA: Diagnosis not present

## 2017-09-06 DIAGNOSIS — J4550 Severe persistent asthma, uncomplicated: Secondary | ICD-10-CM | POA: Diagnosis not present

## 2017-09-06 DIAGNOSIS — Z881 Allergy status to other antibiotic agents status: Secondary | ICD-10-CM | POA: Diagnosis not present

## 2017-09-06 DIAGNOSIS — Z888 Allergy status to other drugs, medicaments and biological substances status: Secondary | ICD-10-CM | POA: Diagnosis not present

## 2017-09-06 DIAGNOSIS — J9801 Acute bronchospasm: Secondary | ICD-10-CM | POA: Diagnosis not present

## 2017-09-06 DIAGNOSIS — Z79899 Other long term (current) drug therapy: Secondary | ICD-10-CM | POA: Diagnosis not present

## 2017-09-06 DIAGNOSIS — Z800 Family history of malignant neoplasm of digestive organs: Secondary | ICD-10-CM | POA: Diagnosis not present

## 2017-09-06 DIAGNOSIS — D8689 Sarcoidosis of other sites: Secondary | ICD-10-CM | POA: Diagnosis not present

## 2017-09-06 DIAGNOSIS — Z7902 Long term (current) use of antithrombotics/antiplatelets: Secondary | ICD-10-CM | POA: Diagnosis not present

## 2017-09-06 DIAGNOSIS — Z7982 Long term (current) use of aspirin: Secondary | ICD-10-CM | POA: Diagnosis not present

## 2017-09-06 MED ORDER — INSULIN ASPART 100 UNIT/ML FLEXPEN: PEN_INJECTOR | SUBCUTANEOUS | 5 refills | Status: DC

## 2017-09-06 MED ORDER — GABAPENTIN 100 MG PO CAPS: 100.0000 mg | ORAL_CAPSULE | Freq: Three times a day (TID) | ORAL | 0 refills | Status: DC

## 2017-09-06 NOTE — Telephone Encounter (Signed)
Routing to Sarah

## 2017-09-19 DIAGNOSIS — J069 Acute upper respiratory infection, unspecified: Secondary | ICD-10-CM | POA: Diagnosis not present

## 2017-09-20 ENCOUNTER — Inpatient Hospital Stay (HOSPITAL_COMMUNITY)
Admission: EM | Admit: 2017-09-20 | Discharge: 2017-09-26 | DRG: 152 | Disposition: A | Discharge status: Home / Self Care | Payer: BLUE CROSS/BLUE SHIELD | Attending: Otolaryngology | Admitting: Otolaryngology

## 2017-09-20 ENCOUNTER — Emergency Department (HOSPITAL_COMMUNITY): Payer: BLUE CROSS/BLUE SHIELD

## 2017-09-20 ENCOUNTER — Emergency Department (HOSPITAL_COMMUNITY): Payer: BLUE CROSS/BLUE SHIELD | Admitting: Anesthesiology

## 2017-09-20 ENCOUNTER — Encounter (HOSPITAL_COMMUNITY)
Admission: EM | Disposition: A | Discharge status: Home / Self Care | Payer: Self-pay | Source: Home / Self Care | Attending: Otolaryngology

## 2017-09-20 ENCOUNTER — Other Ambulatory Visit: Payer: Self-pay

## 2017-09-20 ENCOUNTER — Inpatient Hospital Stay (HOSPITAL_COMMUNITY): Payer: BLUE CROSS/BLUE SHIELD

## 2017-09-20 ENCOUNTER — Encounter (HOSPITAL_COMMUNITY): Payer: Self-pay

## 2017-09-20 DIAGNOSIS — J9600 Acute respiratory failure, unspecified whether with hypoxia or hypercapnia: Secondary | ICD-10-CM | POA: Diagnosis not present

## 2017-09-20 DIAGNOSIS — Z9289 Personal history of other medical treatment: Secondary | ICD-10-CM

## 2017-09-20 DIAGNOSIS — Z8614 Personal history of Methicillin resistant Staphylococcus aureus infection: Secondary | ICD-10-CM

## 2017-09-20 DIAGNOSIS — R0603 Acute respiratory distress: Secondary | ICD-10-CM | POA: Diagnosis not present

## 2017-09-20 DIAGNOSIS — I130 Hypertensive heart and chronic kidney disease with heart failure and stage 1 through stage 4 chronic kidney disease, or unspecified chronic kidney disease: Secondary | ICD-10-CM | POA: Diagnosis not present

## 2017-09-20 DIAGNOSIS — N183 Chronic kidney disease, stage 3 (moderate): Secondary | ICD-10-CM | POA: Diagnosis not present

## 2017-09-20 DIAGNOSIS — E876 Hypokalemia: Secondary | ICD-10-CM | POA: Diagnosis not present

## 2017-09-20 DIAGNOSIS — I110 Hypertensive heart disease with heart failure: Secondary | ICD-10-CM | POA: Diagnosis present

## 2017-09-20 DIAGNOSIS — Z79899 Other long term (current) drug therapy: Secondary | ICD-10-CM

## 2017-09-20 DIAGNOSIS — J9601 Acute respiratory failure with hypoxia: Secondary | ICD-10-CM | POA: Diagnosis not present

## 2017-09-20 DIAGNOSIS — J90 Pleural effusion, not elsewhere classified: Secondary | ICD-10-CM | POA: Diagnosis not present

## 2017-09-20 DIAGNOSIS — J449 Chronic obstructive pulmonary disease, unspecified: Secondary | ICD-10-CM | POA: Diagnosis present

## 2017-09-20 DIAGNOSIS — Z6841 Body Mass Index (BMI) 40.0 and over, adult: Secondary | ICD-10-CM | POA: Diagnosis not present

## 2017-09-20 DIAGNOSIS — J0430 Supraglottitis, unspecified, without obstruction: Principal | ICD-10-CM | POA: Diagnosis present

## 2017-09-20 DIAGNOSIS — T884XXA Failed or difficult intubation, initial encounter: Secondary | ICD-10-CM | POA: Diagnosis not present

## 2017-09-20 DIAGNOSIS — D860 Sarcoidosis of lung: Secondary | ICD-10-CM | POA: Diagnosis not present

## 2017-09-20 DIAGNOSIS — R1310 Dysphagia, unspecified: Secondary | ICD-10-CM | POA: Diagnosis not present

## 2017-09-20 DIAGNOSIS — R9431 Abnormal electrocardiogram [ECG] [EKG]: Secondary | ICD-10-CM | POA: Diagnosis not present

## 2017-09-20 DIAGNOSIS — M109 Gout, unspecified: Secondary | ICD-10-CM | POA: Diagnosis present

## 2017-09-20 DIAGNOSIS — J0510 Acute epiglottitis without obstruction: Secondary | ICD-10-CM | POA: Diagnosis not present

## 2017-09-20 DIAGNOSIS — I10 Essential (primary) hypertension: Secondary | ICD-10-CM | POA: Diagnosis not present

## 2017-09-20 DIAGNOSIS — D638 Anemia in other chronic diseases classified elsewhere: Secondary | ICD-10-CM | POA: Diagnosis present

## 2017-09-20 DIAGNOSIS — K219 Gastro-esophageal reflux disease without esophagitis: Secondary | ICD-10-CM | POA: Diagnosis present

## 2017-09-20 DIAGNOSIS — Z881 Allergy status to other antibiotic agents status: Secondary | ICD-10-CM | POA: Diagnosis not present

## 2017-09-20 DIAGNOSIS — G8929 Other chronic pain: Secondary | ICD-10-CM | POA: Diagnosis present

## 2017-09-20 DIAGNOSIS — Z4682 Encounter for fitting and adjustment of non-vascular catheter: Secondary | ICD-10-CM | POA: Diagnosis not present

## 2017-09-20 DIAGNOSIS — Z888 Allergy status to other drugs, medicaments and biological substances status: Secondary | ICD-10-CM | POA: Diagnosis not present

## 2017-09-20 DIAGNOSIS — J0431 Supraglottitis, unspecified, with obstruction: Secondary | ICD-10-CM

## 2017-09-20 DIAGNOSIS — R079 Chest pain, unspecified: Secondary | ICD-10-CM | POA: Diagnosis not present

## 2017-09-20 DIAGNOSIS — Z7984 Long term (current) use of oral hypoglycemic drugs: Secondary | ICD-10-CM

## 2017-09-20 DIAGNOSIS — F329 Major depressive disorder, single episode, unspecified: Secondary | ICD-10-CM | POA: Diagnosis present

## 2017-09-20 DIAGNOSIS — I5032 Chronic diastolic (congestive) heart failure: Secondary | ICD-10-CM | POA: Diagnosis present

## 2017-09-20 DIAGNOSIS — J9690 Respiratory failure, unspecified, unspecified whether with hypoxia or hypercapnia: Secondary | ICD-10-CM

## 2017-09-20 DIAGNOSIS — F419 Anxiety disorder, unspecified: Secondary | ICD-10-CM | POA: Diagnosis present

## 2017-09-20 DIAGNOSIS — J040 Acute laryngitis: Secondary | ICD-10-CM | POA: Diagnosis not present

## 2017-09-20 DIAGNOSIS — D869 Sarcoidosis, unspecified: Secondary | ICD-10-CM | POA: Diagnosis not present

## 2017-09-20 DIAGNOSIS — R061 Stridor: Secondary | ICD-10-CM | POA: Diagnosis not present

## 2017-09-20 DIAGNOSIS — Z96651 Presence of right artificial knee joint: Secondary | ICD-10-CM | POA: Diagnosis present

## 2017-09-20 DIAGNOSIS — J441 Chronic obstructive pulmonary disease with (acute) exacerbation: Secondary | ICD-10-CM | POA: Diagnosis not present

## 2017-09-20 DIAGNOSIS — Z7952 Long term (current) use of systemic steroids: Secondary | ICD-10-CM

## 2017-09-20 DIAGNOSIS — T884XXS Failed or difficult intubation, sequela: Secondary | ICD-10-CM | POA: Diagnosis not present

## 2017-09-20 DIAGNOSIS — E785 Hyperlipidemia, unspecified: Secondary | ICD-10-CM | POA: Diagnosis present

## 2017-09-20 DIAGNOSIS — R1312 Dysphagia, oropharyngeal phase: Secondary | ICD-10-CM

## 2017-09-20 HISTORY — PX: TRACHEOSTOMY TUBE PLACEMENT: SHX814

## 2017-09-20 LAB — CBC WITH DIFFERENTIAL/PLATELET
Basophils Absolute: 0 10*3/uL (ref 0.0–0.1)
Basophils Relative: 0 %
Eosinophils Absolute: 0.1 10*3/uL (ref 0.0–0.7)
Eosinophils Relative: 0 %
HCT: 33 % — ABNORMAL LOW (ref 36.0–46.0)
Hemoglobin: 10.4 g/dL — ABNORMAL LOW (ref 12.0–15.0)
Lymphocytes Relative: 9 %
Lymphs Abs: 2 10*3/uL (ref 0.7–4.0)
MCH: 26.9 pg (ref 26.0–34.0)
MCHC: 31.5 g/dL (ref 30.0–36.0)
MCV: 85.3 fL (ref 78.0–100.0)
Monocytes Absolute: 1.4 10*3/uL — ABNORMAL HIGH (ref 0.1–1.0)
Monocytes Relative: 7 %
Neutro Abs: 18 10*3/uL — ABNORMAL HIGH (ref 1.7–7.7)
Neutrophils Relative %: 84 %
Platelets: 359 10*3/uL (ref 150–400)
RBC: 3.87 MIL/uL (ref 3.87–5.11)
RDW: 15.6 % — ABNORMAL HIGH (ref 11.5–15.5)
WBC: 21.5 10*3/uL — ABNORMAL HIGH (ref 4.0–10.5)

## 2017-09-20 LAB — I-STAT CHEM 8, ED
BUN: 8 mg/dL (ref 6–20)
Calcium, Ion: 1.15 mmol/L (ref 1.15–1.40)
Chloride: 104 mmol/L (ref 101–111)
Creatinine, Ser: 0.7 mg/dL (ref 0.44–1.00)
Glucose, Bld: 237 mg/dL — ABNORMAL HIGH (ref 65–99)
HCT: 31 % — ABNORMAL LOW (ref 36.0–46.0)
Hemoglobin: 10.5 g/dL — ABNORMAL LOW (ref 12.0–15.0)
Potassium: 3.4 mmol/L — ABNORMAL LOW (ref 3.5–5.1)
Sodium: 140 mmol/L (ref 135–145)
TCO2: 25 mmol/L (ref 22–32)

## 2017-09-20 LAB — BLOOD GAS, ARTERIAL
Acid-base deficit: 1.8 mmol/L (ref 0.0–2.0)
Bicarbonate: 23.8 mmol/L (ref 20.0–28.0)
Drawn by: 418751
FIO2: 80
MECHVT: 470 mL
O2 Saturation: 99.4 %
PEEP: 5 cmH2O
Patient temperature: 98.6
RATE: 16 resp/min
pCO2 arterial: 50.4 mmHg — ABNORMAL HIGH (ref 32.0–48.0)
pH, Arterial: 7.295 — ABNORMAL LOW (ref 7.350–7.450)
pO2, Arterial: 303 mmHg — ABNORMAL HIGH (ref 83.0–108.0)

## 2017-09-20 LAB — GLUCOSE, CAPILLARY
Glucose-Capillary: 267 mg/dL — ABNORMAL HIGH (ref 65–99)
Glucose-Capillary: 272 mg/dL — ABNORMAL HIGH (ref 65–99)

## 2017-09-20 LAB — HEMOGLOBIN A1C
Hgb A1c MFr Bld: 8.7 % — ABNORMAL HIGH (ref 4.8–5.6)
Mean Plasma Glucose: 202.99 mg/dL

## 2017-09-20 LAB — TRIGLYCERIDES: Triglycerides: 197 mg/dL — ABNORMAL HIGH (ref <150)

## 2017-09-20 LAB — RAPID STREP SCREEN (MED CTR MEBANE ONLY): Streptococcus, Group A Screen (Direct): NEGATIVE

## 2017-09-20 SURGERY — CREATION, TRACHEOSTOMY
Anesthesia: Monitor Anesthesia Care | Site: Nose

## 2017-09-20 MED ORDER — ARFORMOTEROL TARTRATE 15 MCG/2ML IN NEBU
15.0000 ug | INHALATION_SOLUTION | Freq: Two times a day (BID) | RESPIRATORY_TRACT | Status: DC
  Administered 2017-09-20 – 2017-09-26 (×12): 15 ug via RESPIRATORY_TRACT
  Filled 2017-09-20 (×13): qty 2

## 2017-09-20 MED ORDER — ORAL CARE MOUTH RINSE
15.0000 mL | Freq: Four times a day (QID) | OROMUCOSAL | Status: DC
  Administered 2017-09-21 – 2017-09-23 (×12): 15 mL via OROMUCOSAL

## 2017-09-20 MED ORDER — SODIUM CHLORIDE 0.9 % IV BOLUS (SEPSIS)
1000.0000 mL | Freq: Once | INTRAVENOUS | Status: AC
  Administered 2017-09-20: 1000 mL via INTRAVENOUS

## 2017-09-20 MED ORDER — VANCOMYCIN HCL IN DEXTROSE 750-5 MG/150ML-% IV SOLN
750.0000 mg | Freq: Two times a day (BID) | INTRAVENOUS | Status: DC
  Administered 2017-09-21 – 2017-09-22 (×4): 750 mg via INTRAVENOUS
  Filled 2017-09-20 (×5): qty 150

## 2017-09-20 MED ORDER — DEXMEDETOMIDINE HCL IN NACL 200 MCG/50ML IV SOLN
INTRAVENOUS | Status: AC
  Filled 2017-09-20: qty 50

## 2017-09-20 MED ORDER — FENOFIBRATE 145 MG PO TABS: 145.0000 mg | ORAL_TABLET | Freq: Every day | ORAL | Status: DC

## 2017-09-20 MED ORDER — PROPOFOL 10 MG/ML IV BOLUS
INTRAVENOUS | Status: AC
  Filled 2017-09-20: qty 20

## 2017-09-20 MED ORDER — FENTANYL CITRATE (PF) 100 MCG/2ML IJ SOLN
100.0000 ug | INTRAMUSCULAR | Status: DC | PRN
  Filled 2017-09-20 (×2): qty 2

## 2017-09-20 MED ORDER — DEXMEDETOMIDINE HCL IN NACL 200 MCG/50ML IV SOLN
INTRAVENOUS | Status: DC | PRN
  Administered 2017-09-20: 0.7 ug/kg/h via INTRAVENOUS

## 2017-09-20 MED ORDER — DEXTROSE 5 % IV SOLN
1.0000 g | INTRAVENOUS | Status: DC
  Administered 2017-09-21 – 2017-09-25 (×5): 1 g via INTRAVENOUS
  Filled 2017-09-20 (×6): qty 10

## 2017-09-20 MED ORDER — HEPARIN SODIUM (PORCINE) 5000 UNIT/ML IJ SOLN
5000.0000 [IU] | Freq: Three times a day (TID) | INTRAMUSCULAR | Status: DC
  Administered 2017-09-20 – 2017-09-26 (×17): 5000 [IU] via SUBCUTANEOUS
  Filled 2017-09-20 (×18): qty 1

## 2017-09-20 MED ORDER — DEXTROSE 5 % IV SOLN
1.0000 g | Freq: Once | INTRAVENOUS | Status: AC
  Administered 2017-09-20: 1 g via INTRAVENOUS
  Filled 2017-09-20: qty 10

## 2017-09-20 MED ORDER — MIDAZOLAM HCL 5 MG/5ML IJ SOLN
INTRAMUSCULAR | Status: DC | PRN
  Administered 2017-09-20 (×2): 1 mg via INTRAVENOUS

## 2017-09-20 MED ORDER — SODIUM CHLORIDE 0.9 % IV SOLN
INTRAVENOUS | Status: DC
  Administered 2017-09-20 – 2017-09-21 (×2): via INTRAVENOUS
  Administered 2017-09-21: 1000 mL via INTRAVENOUS
  Administered 2017-09-22: 09:00:00 via INTRAVENOUS

## 2017-09-20 MED ORDER — MIDAZOLAM HCL 2 MG/2ML IJ SOLN
INTRAMUSCULAR | Status: AC
  Filled 2017-09-20: qty 2

## 2017-09-20 MED ORDER — ASPIRIN 81 MG PO CHEW: 81.0000 mg | CHEWABLE_TABLET | Freq: Every day | ORAL | Status: DC

## 2017-09-20 MED ORDER — DEXAMETHASONE SODIUM PHOSPHATE 10 MG/ML IJ SOLN
10.0000 mg | Freq: Three times a day (TID) | INTRAMUSCULAR | Status: AC
  Administered 2017-09-20 – 2017-09-21 (×2): 10 mg via INTRAVENOUS
  Filled 2017-09-20 (×2): qty 1

## 2017-09-20 MED ORDER — CHLORHEXIDINE GLUCONATE 0.12% ORAL RINSE (MEDLINE KIT)
15.0000 mL | Freq: Two times a day (BID) | OROMUCOSAL | Status: DC
Start: 2017-09-20 — End: 2017-09-23
  Administered 2017-09-20 – 2017-09-23 (×6): 15 mL via OROMUCOSAL

## 2017-09-20 MED ORDER — LIDOCAINE HCL (PF) 4 % IJ SOLN
INTRAMUSCULAR | Status: AC
  Filled 2017-09-20: qty 5

## 2017-09-20 MED ORDER — IOPAMIDOL (ISOVUE-300) INJECTION 61%
INTRAVENOUS | Status: AC
  Administered 2017-09-20: 75 mL
  Filled 2017-09-20: qty 75

## 2017-09-20 MED ORDER — PROPOFOL 1000 MG/100ML IV EMUL
0.0000 ug/kg/min | INTRAVENOUS | Status: DC
  Administered 2017-09-20: 20 ug/kg/min via INTRAVENOUS
  Administered 2017-09-21: 50 ug/kg/min via INTRAVENOUS
  Administered 2017-09-21: 22 ug/kg/min via INTRAVENOUS
  Administered 2017-09-21: 40 ug/kg/min via INTRAVENOUS
  Administered 2017-09-21: 25 ug/kg/min via INTRAVENOUS
  Administered 2017-09-21: 50 ug/kg/min via INTRAVENOUS
  Administered 2017-09-21: 30 ug/kg/min via INTRAVENOUS
  Administered 2017-09-22: 10 ug/kg/min via INTRAVENOUS
  Administered 2017-09-22 (×5): 50 ug/kg/min via INTRAVENOUS
  Administered 2017-09-23: 10 ug/kg/min via INTRAVENOUS
  Filled 2017-09-20 (×14): qty 100

## 2017-09-20 MED ORDER — PROPOFOL 500 MG/50ML IV EMUL
INTRAVENOUS | Status: DC | PRN
  Administered 2017-09-20: 35 ug/kg/min via INTRAVENOUS

## 2017-09-20 MED ORDER — LACTATED RINGERS IV SOLN
INTRAVENOUS | Status: DC | PRN
  Administered 2017-09-20: 18:00:00 via INTRAVENOUS

## 2017-09-20 MED ORDER — BUDESONIDE 0.5 MG/2ML IN SUSP
0.5000 mg | Freq: Two times a day (BID) | RESPIRATORY_TRACT | Status: DC
  Administered 2017-09-20 – 2017-09-26 (×12): 0.5 mg via RESPIRATORY_TRACT
  Filled 2017-09-20 (×15): qty 2

## 2017-09-20 MED ORDER — RACEPINEPHRINE HCL 2.25 % IN NEBU
0.5000 mL | INHALATION_SOLUTION | Freq: Once | RESPIRATORY_TRACT | Status: AC
  Administered 2017-09-20: 0.5 mL via RESPIRATORY_TRACT
  Filled 2017-09-20: qty 0.5

## 2017-09-20 MED ORDER — INSULIN ASPART 100 UNIT/ML ~~LOC~~ SOLN
0.0000 [IU] | SUBCUTANEOUS | Status: DC
  Administered 2017-09-20: 11 [IU] via SUBCUTANEOUS
  Administered 2017-09-21: 7 [IU] via SUBCUTANEOUS
  Administered 2017-09-21 (×2): 11 [IU] via SUBCUTANEOUS
  Administered 2017-09-21 – 2017-09-22 (×6): 7 [IU] via SUBCUTANEOUS
  Administered 2017-09-22 (×2): 4 [IU] via SUBCUTANEOUS
  Administered 2017-09-22: 7 [IU] via SUBCUTANEOUS
  Administered 2017-09-22 – 2017-09-23 (×2): 4 [IU] via SUBCUTANEOUS
  Administered 2017-09-23: 7 [IU] via SUBCUTANEOUS
  Administered 2017-09-23: 3 [IU] via SUBCUTANEOUS
  Administered 2017-09-23: 4 [IU] via SUBCUTANEOUS
  Administered 2017-09-23: 3 [IU] via SUBCUTANEOUS
  Administered 2017-09-23 – 2017-09-24 (×3): 4 [IU] via SUBCUTANEOUS
  Administered 2017-09-24 (×2): 3 [IU] via SUBCUTANEOUS
  Administered 2017-09-25: 4 [IU] via SUBCUTANEOUS
  Administered 2017-09-25: 3 [IU] via SUBCUTANEOUS
  Administered 2017-09-25 – 2017-09-26 (×3): 4 [IU] via SUBCUTANEOUS

## 2017-09-20 MED ORDER — POTASSIUM CHLORIDE 20 MEQ/15ML (10%) PO SOLN: 40.0000 meq | Freq: Once | ORAL | Status: DC

## 2017-09-20 MED ORDER — DEXTROSE 5 % IV SOLN: 1.0000 g | Freq: Once | INTRAVENOUS | Status: DC

## 2017-09-20 MED ORDER — FENTANYL CITRATE (PF) 100 MCG/2ML IJ SOLN
100.0000 ug | INTRAMUSCULAR | Status: DC | PRN
  Administered 2017-09-21 – 2017-09-22 (×8): 100 ug via INTRAVENOUS
  Filled 2017-09-20 (×5): qty 2

## 2017-09-20 MED ORDER — DEXAMETHASONE SODIUM PHOSPHATE 10 MG/ML IJ SOLN
4.0000 mg | Freq: Four times a day (QID) | INTRAMUSCULAR | Status: AC
  Administered 2017-09-21 – 2017-09-23 (×8): 4 mg via INTRAVENOUS
  Filled 2017-09-20 (×8): qty 0.4

## 2017-09-20 MED ORDER — POTASSIUM CHLORIDE 10 MEQ/100ML IV SOLN
10.0000 meq | INTRAVENOUS | Status: AC
  Administered 2017-09-20 – 2017-09-21 (×4): 10 meq via INTRAVENOUS
  Filled 2017-09-20 (×4): qty 100

## 2017-09-20 MED ORDER — DEXMEDETOMIDINE HCL IN NACL 200 MCG/50ML IV SOLN
INTRAVENOUS | Status: DC | PRN
  Administered 2017-09-20: 12 ug via INTRAVENOUS
  Administered 2017-09-20: 16 ug via INTRAVENOUS
  Administered 2017-09-20: 12 ug via INTRAVENOUS
  Administered 2017-09-20: 120 ug via INTRAVENOUS

## 2017-09-20 MED ORDER — ATORVASTATIN CALCIUM 40 MG PO TABS: 40.0000 mg | ORAL_TABLET | Freq: Every day | ORAL | Status: DC

## 2017-09-20 MED ORDER — DEXAMETHASONE SODIUM PHOSPHATE 10 MG/ML IJ SOLN
10.0000 mg | Freq: Four times a day (QID) | INTRAMUSCULAR | Status: DC
  Filled 2017-09-20 (×2): qty 1

## 2017-09-20 MED ORDER — HYDROMORPHONE HCL 1 MG/ML IJ SOLN
1.0000 mg | Freq: Once | INTRAMUSCULAR | Status: AC
  Administered 2017-09-20: 1 mg via INTRAVENOUS
  Filled 2017-09-20: qty 1

## 2017-09-20 MED ORDER — VANCOMYCIN HCL 10 G IV SOLR
2000.0000 mg | Freq: Once | INTRAVENOUS | Status: AC
  Administered 2017-09-20: 2000 mg via INTRAVENOUS
  Filled 2017-09-20: qty 2000

## 2017-09-20 MED ORDER — FENTANYL CITRATE (PF) 100 MCG/2ML IJ SOLN
INTRAMUSCULAR | Status: DC | PRN
  Administered 2017-09-20 (×5): 50 ug via INTRAVENOUS

## 2017-09-20 MED ORDER — DEXAMETHASONE SODIUM PHOSPHATE 10 MG/ML IJ SOLN
10.0000 mg | Freq: Once | INTRAMUSCULAR | Status: AC
  Administered 2017-09-20: 10 mg via INTRAVENOUS
  Filled 2017-09-20: qty 1

## 2017-09-20 MED ORDER — FENTANYL CITRATE (PF) 250 MCG/5ML IJ SOLN
INTRAMUSCULAR | Status: AC
  Filled 2017-09-20: qty 5

## 2017-09-20 MED ORDER — OXYMETAZOLINE HCL 0.05 % NA SOLN
1.0000 | Freq: Once | NASAL | Status: AC
  Administered 2017-09-20: 1 via NASAL
  Filled 2017-09-20: qty 15

## 2017-09-20 MED ORDER — SUCCINYLCHOLINE CHLORIDE 200 MG/10ML IV SOSY
PREFILLED_SYRINGE | INTRAVENOUS | Status: AC
  Filled 2017-09-20: qty 10

## 2017-09-20 MED ORDER — LIDOCAINE-EPINEPHRINE 1 %-1:100000 IJ SOLN
INTRAMUSCULAR | Status: AC
Start: 2017-09-20 — End: ?
  Filled 2017-09-20: qty 1

## 2017-09-20 MED ORDER — PANTOPRAZOLE SODIUM 40 MG IV SOLR
40.0000 mg | Freq: Every day | INTRAVENOUS | Status: DC
  Administered 2017-09-20 – 2017-09-26 (×7): 40 mg via INTRAVENOUS
  Filled 2017-09-20 (×7): qty 40

## 2017-09-20 MED ORDER — OXYMETAZOLINE HCL 0.05 % NA SOLN
NASAL | Status: AC
  Filled 2017-09-20: qty 15

## 2017-09-20 MED ORDER — DEXAMETHASONE SODIUM PHOSPHATE 10 MG/ML IJ SOLN: 10.0000 mg | Freq: Four times a day (QID) | INTRAMUSCULAR | Status: DC

## 2017-09-20 MED ORDER — ROCURONIUM BROMIDE 100 MG/10ML IV SOLN
INTRAVENOUS | Status: DC | PRN
  Administered 2017-09-20: 50 mg via INTRAVENOUS

## 2017-09-20 MED ORDER — ALBUTEROL SULFATE (2.5 MG/3ML) 0.083% IN NEBU: 2.5000 mg | INHALATION_SOLUTION | RESPIRATORY_TRACT | Status: DC | PRN

## 2017-09-20 MED ORDER — LIDOCAINE HCL (PF) 4 % IJ SOLN
INTRAMUSCULAR | Status: AC
  Filled 2017-09-20: qty 25

## 2017-09-20 MED ORDER — FENTANYL CITRATE (PF) 100 MCG/2ML IJ SOLN
100.0000 ug | Freq: Once | INTRAMUSCULAR | Status: AC
  Administered 2017-09-20: 100 ug via INTRAVENOUS
  Filled 2017-09-20: qty 2

## 2017-09-20 MED ORDER — SODIUM CHLORIDE 0.9 % IV SOLN
2000.0000 mg | Freq: Once | INTRAVENOUS | Status: DC
  Filled 2017-09-20: qty 2000

## 2017-09-20 SURGICAL SUPPLY — 39 items
BLADE CLIPPER SURG (BLADE) IMPLANT
BLADE SURG 15 STRL LF DISP TIS (BLADE) IMPLANT
BLADE SURG 15 STRL SS (BLADE)
CANISTER SUCT 3000ML PPV (MISCELLANEOUS) ×2 IMPLANT
CLEANER TIP ELECTROSURG 2X2 (MISCELLANEOUS) ×2 IMPLANT
COVER SURGICAL LIGHT HANDLE (MISCELLANEOUS) ×2 IMPLANT
CRADLE DONUT ADULT HEAD (MISCELLANEOUS) IMPLANT
DECANTER SPIKE VIAL GLASS SM (MISCELLANEOUS) ×2 IMPLANT
DRAPE HALF SHEET 40X57 (DRAPES) IMPLANT
ELECT COATED BLADE 2.86 ST (ELECTRODE) ×2 IMPLANT
ELECT REM PT RETURN 9FT ADLT (ELECTROSURGICAL) ×2
ELECTRODE REM PT RTRN 9FT ADLT (ELECTROSURGICAL) ×1 IMPLANT
GAUZE PETROLATUM 1 X8 (GAUZE/BANDAGES/DRESSINGS) IMPLANT
GAUZE SPONGE 4X4 16PLY XRAY LF (GAUZE/BANDAGES/DRESSINGS) ×2 IMPLANT
GLOVE BIO SURGEON STRL SZ 6.5 (GLOVE) ×2 IMPLANT
GLOVE BIO SURGEON STRL SZ7.5 (GLOVE) ×2 IMPLANT
GOWN STRL REUS W/ TWL LRG LVL3 (GOWN DISPOSABLE) ×3 IMPLANT
GOWN STRL REUS W/TWL LRG LVL3 (GOWN DISPOSABLE) ×6
HOLDER TRACH TUBE VELCRO 19.5 (MISCELLANEOUS) ×2 IMPLANT
KIT BASIN OR (CUSTOM PROCEDURE TRAY) ×2 IMPLANT
KIT ROOM TURNOVER OR (KITS) ×2 IMPLANT
KIT SUCTION CATH 14FR (SUCTIONS) IMPLANT
NEEDLE HYPO 25GX1X1/2 BEV (NEEDLE) ×2 IMPLANT
NS IRRIG 1000ML POUR BTL (IV SOLUTION) ×2 IMPLANT
PACK GENERAL/GYN (CUSTOM PROCEDURE TRAY) ×2 IMPLANT
PAD ARMBOARD 7.5X6 YLW CONV (MISCELLANEOUS) ×4 IMPLANT
PENCIL BUTTON HOLSTER BLD 10FT (ELECTRODE) ×2 IMPLANT
SPONGE DRAIN TRACH 4X4 STRL 2S (GAUZE/BANDAGES/DRESSINGS) IMPLANT
SPONGE INTESTINAL PEANUT (DISPOSABLE) IMPLANT
SUT SILK 0 FSL (SUTURE) IMPLANT
SUT SILK 2 0 REEL (SUTURE) IMPLANT
SUT SILK 2 0 SH CR/8 (SUTURE) IMPLANT
SUT VIC AB 2-0 FS1 27 (SUTURE) IMPLANT
SYR 20ML ECCENTRIC (SYRINGE) ×2 IMPLANT
SYR BULB 3OZ (MISCELLANEOUS) ×2 IMPLANT
SYR CONTROL 10ML LL (SYRINGE) ×2 IMPLANT
TRAY ENT MC OR (CUSTOM PROCEDURE TRAY) ×2 IMPLANT
TUBE CONNECTING 12X1/4 (SUCTIONS) ×2 IMPLANT
WATER STERILE IRR 1000ML POUR (IV SOLUTION) ×2 IMPLANT

## 2017-09-20 NOTE — Op Note (Signed)
DATE OF PROCEDURE:  09/20/2017     PRE-OPERATIVE DIAGNOSIS:  Respiratory distress Supraglottitis     POST-OPERATIVE DIAGNOSIS:  Same    PROCEDURE(S):  Awake fiberoptic intubation    URGEON:  Gavin Pound, MD   ASSISTANT(S):  Melida Quitter, MD    ANESTHESIA:  MAC     ESTIMATED BLOOD LOSS:  17m    SPECIMENS:  none     COMPLICATIONS:  None     OPERATIVE FINDINGS: The patient had a very inflamed supraglottis. The epiglottis was grossly edematous and erythematous. Arytenoids were edematous and hooded over the true cords. AE folds were indiscernible from the false folds. Cords were very difficult to visualize. Patient did not tolerate transoral flexible scope, therefore a transnasal flexible fiberoptic intubation with a 6.5 ETT was done. This tube should remain in place for at least 48-72 hours prior to considering changing the intubation.     OPERATIVE DETAILS:  The patient was brought emergently to the operating room and placed in a semi-reclined position. Precedex and versed were administered. Anesthesia, Dr. JLinna Caprice performed local topical anesthesia with 4% lidocaine intraorally. Using the 4.327mAmbu scope, the transoral approach was attempted multiple times, but not tolerated by the patient secondary to patient anxiety. Next, a nasal trumpet coated in viscous lidocaine was placed into the right side of the nose. The 6.5 ETT was then placed into the nose to dilate the nasal cavity. Flexible scope was passed through the tube and into the nasopharynx. The supraglottis demonstrated significant altered anatomy due to edema and infection, as described above. The scope was ultimately able to be passed through the cords and the trachea was visualized down to carina. The scope was removed. End-tidal CO2 and bialteral breath sounds were confirmed on the ventilator. The scope was passed through the tube again to ensure that the tube was above the carina, and  indeed it was. The tube was secured to the right side of the septum with a 0 silk suture as well as tape. The patient was returned to the care of the anesthesia staff, transported to ICU in guarded condition.

## 2017-09-20 NOTE — Progress Notes (Signed)
Pharmacy Antibiotic Note  [name] is a 56 y.o. female admitted on 09/20/2017 with epiglotitis.  Pharmacy has been consulted for vancomycin dosing.  SCr stable. Normalized CrCl ~52ml/min  Plan: Give vancomycin 2g IV x 1, then start vancomycin 750mg  IV q12H Continue ceftriaxone per MD Monitor clinical picture, renal function, VT prn F/U C&S, abx deescalation / LOT      Temp (24hrs), Avg:99.3 F (37.4 C), Min:98.9 F (37.2 C), Max:99.7 F (37.6 C)  Recent Labs  Lab 09/20/17 1418 09/20/17 1431  WBC 21.5*  --   CREATININE  --  0.70    CrCl cannot be calculated (Unknown ideal weight.).    Allergies  Allergen Reactions  . Methotrexate Other (See Comments)    Peri-oral and buccal lesions  . Vancomycin Other (See Comments)    Nephrotoxicity   . Clindamycin/Lincomycin Nausea And Vomiting and Rash  . Chlorhexidine Itching  . Doxycycline Rash  . Lisinopril Cough  . Teflaro [Ceftaroline] Rash   Thank you for allowing pharmacy to be a part of this patient's care.  Reginia Naas 09/20/2017 5:34 PM

## 2017-09-20 NOTE — ED Notes (Signed)
Pt family at nurse's station, stating that the pt is having trouble moving air now. Informed Alvino Chapel, MD.

## 2017-09-20 NOTE — Anesthesia Procedure Notes (Signed)
Procedure Name: Awake intubation Date/Time: 09/20/2017 7:15 PM Performed by: Clovis Cao, CRNA Pre-anesthesia Checklist: Patient identified, Emergency Drugs available, Suction available, Patient being monitored and Timeout performed Patient Re-evaluated:Patient Re-evaluated prior to induction Oxygen Delivery Method: Circle system utilized Preoxygenation: Pre-oxygenation with 100% oxygen Induction Type: Inhalational induction Laryngoscope size: FOB. Grade View: Grade IV Nasal Tubes: Right and Nasal Rae Tube size: 6.5 mm Number of attempts: 1 Airway Equipment and Method: Fiberoptic brochoscope Placement Confirmation: ETT inserted through vocal cords under direct vision and positive ETCO2 Tube secured with: Tape (sutured to septum by surgeon ) Dental Injury: Teeth and Oropharynx as per pre-operative assessment

## 2017-09-20 NOTE — Progress Notes (Signed)
Pt came back from OR nasally intubated secured with sutured and pink tape at the hub, no numbers visible. RT placed ETT holder on as well, upside down to help keep tube secure. RT to continue to monitor.

## 2017-09-20 NOTE — Anesthesia Postprocedure Evaluation (Signed)
Anesthesia Post Note  Patient: [name]  Procedure(s) Performed: AWAKE INTUBATION WITH ANESTHESIA WITH VIDEO ASSISTANCE (N/A Nose)     Patient location during evaluation: ICU Anesthesia Type: General Level of consciousness: patient remains intubated per anesthesia plan Pain management: pain level controlled Vital Signs Assessment: post-procedure vital signs reviewed and stable Respiratory status: patient remains intubated per anesthesia plan Cardiovascular status: stable Postop Assessment: no apparent nausea or vomiting Anesthetic complications: no    Last Vitals:  Vitals:   09/20/17 1730 09/20/17 2000  BP: (!) 156/65 (P) 132/67  Pulse: (!) 105 (P) 83  Resp: 13 (P) 17  Temp:    SpO2: 96% (P) 100%    Last Pain:  Vitals:   09/20/17 1533  TempSrc:   PainSc: 10-Worst pain ever                 Pascale Maves

## 2017-09-20 NOTE — Transfer of Care (Signed)
Immediate Anesthesia Transfer of Care Note  Patient: [name]  Procedure(s) Performed: AWAKE INTUBATION WITH ANESTHESIA WITH VIDEO ASSISTANCE (N/A Nose)  Patient Location: ICU  Anesthesia Type:General  Level of Consciousness: sedated and Patient remains intubated per anesthesia plan  Airway & Oxygen Therapy: Patient remains intubated per anesthesia plan and Patient placed on Ventilator (see vital sign flow sheet for setting)  Post-op Assessment: Report given to RN and Post -op Vital signs reviewed and stable  Post vital signs: Reviewed and stable  Last Vitals:  Vitals:   09/20/17 2000 09/20/17 2015  BP: 132/67 138/64  Pulse: 83 84  Resp: 17 16  Temp:    SpO2: 100% 100%    Last Pain:  Vitals:   09/20/17 1533  TempSrc:   PainSc: 10-Worst pain ever         Complications: No apparent anesthesia complications

## 2017-09-20 NOTE — Anesthesia Preprocedure Evaluation (Addendum)
Anesthesia Evaluation  Patient identified by MRN, date of birth, ID band Patient awake    Reviewed: Allergy & Precautions, NPO status , Patient's Chart, lab work & pertinent test results  Airway Mallampati: II       Dental  (+) Edentulous Upper, Edentulous Lower   Pulmonary     + wheezing + stridor     Cardiovascular hypertension,  Rhythm:Regular Rate:Tachycardia     Neuro/Psych    GI/Hepatic   Endo/Other  diabetes  Renal/GU      Musculoskeletal   Abdominal (+) + obese,   Peds  Hematology   Anesthesia Other Findings   Reproductive/Obstetrics                           Anesthesia Physical Anesthesia Plan  ASA: IV and emergent  Anesthesia Plan: General   Post-op Pain Management:    Induction: Inhalational  PONV Risk Score and Plan:   Airway Management Planned: Video Laryngoscope Planned, Fiberoptic Intubation Planned, Awake Intubation Planned and Tracheostomy  Additional Equipment:   Intra-op Plan:   Post-operative Plan:   Informed Consent:   Only emergency history available  Plan Discussed with: Anesthesiologist and CRNA  Anesthesia Plan Comments:        Anesthesia Quick Evaluation

## 2017-09-20 NOTE — Consult Note (Signed)
[place]  Referring Physician: Dr. Alvino [name] Primary Care Physician: [name] Olp, MD Patient Location at Initial Consult: Emergency Department Chief Complaint/Reason for Consult: epiglottitis  History of Presenting Illness:  History obtained from patient and her husband [name] is a  56 y.o. female presenting with acute-onset sore throat 2 days ago. She has been having dysphagia. Inability to tolerate any PO intake x 2 days. Was seen at Urgent Care yesterday and given Keflex, but continued to progress. Started losing voice yesterday, today no voice. Has noted some difficulty breathing for the last 12 hours. Cannot lie flat. No prior smoking history. No prior airway surgeries. No fever at home. +malaise +fatigue +stridor. WBC was 21. Has just received 72m IV Decadron, IV rocephin. Has tried racemic epinephrine, but still struggling to breathe slightly. Currently on room air.   Past Medical History:  Diagnosis Date  . Abnormal SPEP 04/17/2014  . Acute left ankle pain 01/26/2017  . ANEMIA-UNSPECIFIED 09/18/2009  . CHF (congestive heart failure) ([facility]   . Chronic diastolic heart failure, NYHA class 2 (HCC)    LVEDP roughly 20% by cath  . COPD (chronic obstructive pulmonary disease) ([facility]   . Depression   . DIABETES MELLITUS, TYPE II 08/21/2006  . Diabetic osteomyelitis ([facility] 05/29/2015  . Fracture of 5th metatarsal    non union  . GERD 08/21/2006  . GOUT 08/20/2010  . Hx of umbilical hernia repair   . HYPERLIPIDEMIA 08/21/2006  . HYPERTENSION 08/21/2006  . Infection of wound due to methicillin resistant Staphylococcus aureus (MRSA)   . Internal hemorrhoids   . Multiple allergies 10/14/2016  . OBESITY 06/04/2009  . Onychomycosis 10/27/2015  . Osteomyelitis of left foot ([facility] 05/29/2015  . Pulmonary sarcoidosis ([place]    Followed locally by pulmonology, but [facility]  . Right knee pain  01/26/2017  . Vocal cord dysfunction   . Wears partial dentures     Past Surgical History:  Procedure Laterality Date  . ABDOMINAL HYSTERECTOMY    . APPENDECTOMY    . BLADDER SUSPENSION  11/11/2011   Procedure: TRANSVAGINAL TAPE (TVT) PROCEDURE;  Surgeon: [name] Millers MD;  [facility];  Service: Gynecology;  Laterality: N/A;  . CARDIAC CATHETERIZATION  07/2010   LVEF 50-55% WITH VERY MILD GLOBAL HYPOKINESIA; ESSENTIALLY NORMAL CORONARY ARTERIES; NORMAL LV FUNCTION  . CAROTIDS  02/18/11   CAROTID DUPLEX; VERTEBRALS ARE PATENT WITH ANTEGRADE FLOW. ICA/CCA RATIO 1.61 ON RIGHT AND 0.75 ON LEFT  . CHOLECYSTECTOMY  1984  . CYSTOSCOPY  11/11/2011   Procedure: CYSTOSCOPY;  Surgeon: [name] Millers MD;  [place];  Service: Gynecology;  Laterality: N/A;  . DOPPLER ECHOCARDIOGRAPHY  02/12/2013   LV FUNCTION, SIZE NORMAL; MILD CONCENTRIC LVH; EST EF 55-65%; WALL MOTION NORMAL  . FRACTURE SURGERY     foot  . HERNIA REPAIR    . I&D EXTREMITY Left 06/27/2015   Procedure: Partial Excision Left Calcaneus, Place Antibiotic Beads, and Wound VAC;  Surgeon: MNewt Minion MD;  [place] [facility]  Service: Orthopedics;  Laterality: Left;  . KNEE ARTHROSCOPY     right  . LEFT AND RIGHT HEART CATHETERIZATION WITH CORONARY ANGIOGRAM N/A 04/23/2013   Procedure: LEFT AND RIGHT HEART CATHETERIZATION WITH CORONARY ANGIOGRAM;  Surgeon: DLeonie Man MD;  [facility];  Service: Cardiovascular;  Laterality: N/A;  . LEFT HEART CATH AND CORONARY ANGIOGRAPHY N/A 03/11/2017   Procedure: Left Heart Cath  and Coronary Angiography;  Surgeon: [name] Mocha, MD;  Location: [place] CV LAB;  Service: Cardiovascular;  Laterality: N/A;  . LexiScan Myoview  03/09/2013   EF 50%; NORMAL MYOCARDIAL PERFUSION STUDY - breast attenuation  . METATARSAL OSTEOTOMY WITH OPEN REDUCTION INTERNAL FIXATION (ORIF) METATARSAL WITH FUSION Left 04/09/2014   Procedure: LEFT FOOT FRACTURE OPEN TREATMENT METATARSAL INCLUDES  INTERNAL FIXATION EACH;  Surgeon: [name] Junes, MD;  Location: [place];  Service: Orthopedics;  Laterality: Left;  . NISSEN FUNDOPLICATION  3790  . Right and left CARDIAC CATHETERIZATION  04/23/2013   Angiographic normal coronaries; LVEDP 20 mmHg, PCWP 12-14 mmHg, RAP 12 mmHg.; Fick CO/CI 4.9/2.2  . TOTAL KNEE ARTHROPLASTY Right 06/29/2017   Procedure: RIGHT TOTAL KNEE ARTHROPLASTY;  Surgeon: [name] Minion, MD;  Location: [place];  Service: Orthopedics;  Laterality: Right;  . TUBAL LIGATION     with reversal in 1994  . VENTRAL HERNIA REPAIR      Family History  Problem Relation Age of Onset  . Diabetes Father   . Heart attack Father 18  . Coronary artery disease Father   . Heart failure Father   . COPD Mother   . Emphysema Mother   . Asthma Mother   . Heart failure Mother   . Breast cancer Mother   . Heart attack Maternal Grandfather   . Sarcoidosis Maternal Uncle   . Lung cancer Brother   . Diabetes Brother   . Colon cancer Neg Hx     Social History   Socioeconomic History  . Marital status: Married    Spouse name: [name]  . Number of children: 2  . Years of education: 61  . Highest education level: None  Social Needs  . Financial resource strain: None  . Food insecurity - worry: None  . Food insecurity - inability: None  . Transportation needs - medical: None  . Transportation needs - non-medical: None  Occupational History  . Occupation: DISABLED  Tobacco Use  . Smoking status: Never Smoker  . Smokeless tobacco: Never Used  Substance and Sexual Activity  . Alcohol use: No    Alcohol/week: 0.0 oz  . Drug use: No  . Sexual activity: Yes    Birth control/protection: Surgical  Other Topics Concern  . None  Social History Narrative   Married 1994. 2 sons who both live close and 1 grandson.       Disability due to sarcoidosis. 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.       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    Current Outpatient Medications on File Prior to Encounter  Medication Sig Dispense Refill  . albuterol (PROVENTIL HFA;VENTOLIN HFA) 108 (90 Base) MCG/ACT inhaler Inhale 1-2 puffs into the lungs every 6 (six) hours as needed for wheezing or shortness of breath. 8 g 5  . allopurinol (ZYLOPRIM) 100 MG tablet Take 100 mg by mouth at bedtime.     [name] aspirin EC 81 MG tablet Take 81 mg by mouth daily.     [name] atorvastatin (LIPITOR) 40 MG tablet TAKE 1 TAB BY MOUTH ONCE DAILY 90 tablet 3  . BAYER MICROLET LANCETS lancets Use as instructed to check sugar 3 times daily 300 each 5  . Blood Glucose Monitoring Suppl (BAYER CONTOUR MONITOR) w/Device KIT Use daily to check sugar  1 kit 0  . buPROPion (WELLBUTRIN XL) 300 MG 24 hr tablet TAKE 1 TABLET (300 MG TOTAL) BY MOUTH DAILY. 90 tablet 3  . carvedilol (COREG) 25 MG tablet Take 1 tablet (25 mg total) by mouth 2 (two) times daily with a meal. 180 tablet 3  . dexlansoprazole (DEXILANT) 60 MG capsule Take 1 capsule (60 mg total) by mouth daily. 30 capsule 1  . diclofenac sodium (VOLTAREN) 1 % GEL APPLY 2 GRAMS TOPICALLY 4 TIMES A DAY (Patient taking differently: APPLY 2 GRAMS TOPICALLY 4 TIMES A DAY AS NEEDED FOR KNEE PAIN) 100 g 0  . diltiazem (CARDIZEM CD) 120 MG 24 hr capsule Take 1 capsule (120 mg total) by mouth daily. 90 capsule 3  . fenofibrate (TRICOR) 145 MG tablet TAKE 1 TABLET BY MOUTH DAILY 30 tablet 4  . fluticasone (FLONASE) 50 MCG/ACT nasal spray Place 2 sprays into both nostrils daily. 48 g 3  . furosemide (LASIX) 40 MG tablet TAKE 1 TABLET BY MOUTH TWICE A DAY (Patient taking differently: TAKE 1 TABLET (40 MG) BY MOUTH TWICE A DAY) 60 tablet 11  . gabapentin (NEURONTIN) 100 MG capsule Take 1 capsule (100 mg total) by mouth 3 (three) times daily. 90 capsule 0  . glucose blood (BAYER CONTOUR  TEST) test strip Use as instructed to check sugar 3 times daily 300 each 5  . insulin aspart (NOVOLOG FLEXPEN) 100 UNIT/ML FlexPen Inject 25-30 units in the skin 3 times daily with meals 30 mL 5  . Insulin Glargine (TOUJEO MAX SOLOSTAR) 300 UNIT/ML SOPN Inject 45 Units into the skin at bedtime. 3 pen 11  . Insulin Glargine (TOUJEO SOLOSTAR) 300 UNIT/ML SOPN Inject 40 Units at bedtime into the skin. 5 pen 11  . Insulin Pen Needle 33G X 4 MM MISC 1 each by Does not apply route 4 (four) times daily. 200 each 11  . LORazepam (ATIVAN) 0.5 MG tablet Take 0.5 mg by mouth every 12 (twelve) hours as needed for anxiety.    . metFORMIN (GLUCOPHAGE) 1000 MG tablet Take 1 tablet (1,000 mg total) by mouth 2 (two) times daily with a meal. 180 tablet 0  . mometasone-formoterol (DULERA) 200-5 MCG/ACT AERO Inhale 2 puffs into the lungs 2 (two) times daily. 13 g 5  . nitroGLYCERIN (NITROSTAT) 0.4 MG SL tablet Place 1 tablet (0.4 mg total) under the tongue every 5 (five) minutes as needed for chest pain. Reported on 01/05/2016 100 tablet 0  . orlistat (XENICAL) 120 MG capsule Take 1 capsule (120 mg total) by mouth 3 (three) times daily with meals. 90 capsule 2  . oxyCODONE-acetaminophen (ROXICET) 5-325 MG tablet Take 1 tablet by mouth every 4 (four) hours as needed for severe pain. 60 tablet 0  . PARoxetine Mesylate (BRISDELLE) 7.5 MG CAPS Take 7.5 mg by mouth daily.    . Potassium Chloride ER 20 MEQ TBCR Take 1 tablet by mouth 3 (three) times daily. 90 tablet 3  . predniSONE (DELTASONE) 20 MG tablet Take 2 pills for 5 days 10 tablet 0  . traMADol (ULTRAM) 50 MG tablet Take 1 tablet (50 mg total) by mouth every 12 (twelve) hours as needed for moderate pain or severe pain. 30 tablet 2  . venlafaxine XR (EFFEXOR-XR) 75 MG 24 hr capsule Take 2 capsules (150 mg total) by mouth at bedtime.    . vitamin B-12 (CYANOCOBALAMIN) 1000 MCG tablet Take 1,000 mcg by mouth daily.      Allergies  Allergen Reactions  . Methotrexate  Other (See Comments)    Peri-oral and buccal lesions  . Vancomycin Other (See Comments)    Nephrotoxicity   . Clindamycin/Lincomycin Nausea And Vomiting and Rash  . Chlorhexidine Itching  . Doxycycline Rash  . Lisinopril Cough  . Teflaro [Ceftaroline] Rash     Review of Systems: ROS negative on 12-point except for those mentioned in above HPI   OBJECTIVE: Vital Signs: Vitals:   09/20/17 1533 09/20/17 1605  BP:  (!) 149/71  Pulse:  99  Resp:  17  Temp: 98.9 F (37.2 C)   SpO2:  99%    I&O  Intake/Output Summary (Last 24 hours) at 09/20/2017 1653 Last data filed at 09/20/2017 1533 Gross per 24 hour  Intake 1000 ml  Output -  Net 1000 ml    Physical Exam General: Well developed, well nourished. No acute distress. Voice- severe dysphonia.  +biphasic stridor.   Head/Face: Normocephalic, atraumatic. No scars or lesions. No sinus tenderness. Facial nerve intact and equal bilaterally.  No facial lacerations. Salivary glands non tender and without palpable masses  Eyes: Globes well positioned, no proptosis Lids: No periorbital edema/ecchymosis. No lid laceration Conjunctiva: No chemosis, hemorrhage PERRL  Ears: No gross deformity. Normal external canal. Tympanic membrane intact bilaterally, slightly dull in appearance  Hearing: Normal speech reception.  Nose: No gross deformity or lesions. No purulent discharge. Septum midline. No turbinate hypertrophy.  Mouth/Oropharynx: Lips without any lesions. Dentition: edentulous. No mucosal lesions within the oropharynx. No tonsillar enlargement, exudate, or lesions. Pharyngeal walls symmetrical, though erythematous and edematous Uvula midline. Tongue midline without lesions.  Larynx: See TFL  Nasopharynx: See TFL  Neck: Trachea midline. No masses. No thyromegaly or nodules palpated. No crepitus. Tenderness over thyroid cartilage  Lymphatic: No lymphadenopathy in the neck.  Respiratory: Room air +biphasic stridor  Cardiovascular:  Regular rate and rhythm.  Extremities: No edema or cyanosis. Warm and well-perfused.  Skin: No scars or lesions on face or neck.  Neurologic: CN II-XII intact. Moving all extremities without gross abnormality.  Other:      Labs: Lab Results  Component Value Date   WBC 21.5 (H) 09/20/2017   HGB 10.5 (L) 09/20/2017   HCT 31.0 (L) 09/20/2017   PLT 359 09/20/2017   CHOL 163 03/10/2017   TRIG 395 (H) 03/10/2017   HDL 35 (L) 03/10/2017   LDLDIRECT 76.0 01/15/2016   ALT 19 08/12/2017   AST 22 08/12/2017   NA 140 09/20/2017   K 3.4 (L) 09/20/2017   CL 104 09/20/2017   CREATININE 0.70 09/20/2017   BUN 8 09/20/2017   CO2 26 08/15/2017   TSH 2.665 03/09/2017   INR 1.04 03/10/2017   HGBA1C 7.8 (H) 08/13/2017   MICROALBUR 0.9 07/01/2014     Review of Ancillary Data / Diagnostic Tests: CT neck (personally reviewed)-  patent airway but thickened epiglottis on lateral view, no mass. Slightly narrowed at false folds. No enlarged LNs. No abnormal dentition.        Procedure: Transnasal Flexible Laryngoscopy  Preoperative Diagnosis: supraglottitis Postoperative Diagnosis: same, with pending airway compromise Procedure: Transnasal fiberoptic laryngoscopy.  Estimated Blood Loss: 0 mL.  Complications: None.  Findings: The nasal cavity and nasopharynx are unremarkable. There are no suspicious findings in the nasopharynx or Fossa of Rosenmller. The tongue base is normal. The epiglottis is erythematous, slightly edematous, with small blister over right lingual surface. Globally edematous. Very edematous arytenoids and false folds. Can see true cords anteriorly, which do not appear to be affected. Very narrow  airway in the supraglottic region.    Description of Procedure: With the patient in the sitting position, topical  Afrin-lidocaine mixture in an atomizer was applied to the nose. The scope was passed through the nose. Examination was carried out of the nose, nasopharynx, oropharynx,  hypopharynx, and larynx with findings as noted above. Scope was removed.  The patient tolerated the procedure well.     ASSESSMENT:  57 y.o. female with supraglottitis which has led to stridor, severe supraglottic swelling. She has already trialed IV decadron and racemic epinephrine, yet continues with biphasic stridor and significant supraglottic edema on scope examination. I had a discussion with the patient and her husband regarding the risks and benefits of awake fiberoptic intubation (to be done by Anesthesia) with possible tracheotomy if airway is unable to be obtained.   RECOMMENDATIONS: -To OR to obtain secure airway. Patient to remain intubated for at least 48-72 hours.  -Patient will need broad spectrum coverage for epiglottitis, which typically involved IV vancomycin + IV rocephin   Gavin Pound, MD  [facility], [place] Office phone [phone]

## 2017-09-20 NOTE — Consult Note (Signed)
PULMONARY / CRITICAL CARE MEDICINE   Name: [name] MRN: [id number] DOB: 01-Nov-1960    ADMISSION DATE:  09/20/2017 CONSULTATION DATE:  09/20/17  REFERRING MD:  Blenda Nicely  CHIEF COMPLAINT:  Sore Throat  HISTORY OF PRESENT ILLNESS:  [name] encephelopathic; therefore, this HPI is obtained from chart review. [name] is a 56 y.o. female with PMH as outlined below. She presented to [facility] ED 12/11 with odynophagia and dysphagia x 2 days.  She was seen at urgent care the day prior and given Keflex; however, symptoms continued to progress. She later began to lose voice and on day of presentation had no voice at all.  She came to ED due to worsening symptoms including malaise, fatigue, stridor.  In ED, she was seen by ENT and had transnasal flexible laryngoscopy that demonstrated an erythematous and edematous epiglottis with small blister, very edematous arytenoids and false cords.  Findings c/w supraglottitis. She was subsequently taken to OR where she had an awake fiberoptic nasal intubation for airway protection.  She was started on vanc and rocephin as well as IV steroids.  PCCM asked to assist with vent management.  PAST MEDICAL HISTORY :  She  has a past medical history of Abnormal SPEP (04/17/2014), Acute left ankle pain (01/26/2017), ANEMIA-UNSPECIFIED (09/18/2009), CHF (congestive heart failure) (HCC), Chronic diastolic heart failure, NYHA class 2 ([place]), COPD (chronic obstructive pulmonary disease) ([place]), Depression, DIABETES MELLITUS, TYPE II (08/21/2006), Diabetic osteomyelitis ([place]) (05/29/2015), Fracture of 5th metatarsal, GERD (08/21/2006), GOUT (36/64/4034), umbilical hernia repair, HYPERLIPIDEMIA (08/21/2006), HYPERTENSION (08/21/2006), Infection of wound due to methicillin resistant Staphylococcus aureus (MRSA), Internal hemorrhoids, Multiple allergies (10/14/2016), OBESITY (06/04/2009), Onychomycosis (10/27/2015), Osteomyelitis of left foot ([place]) (05/29/2015), Pulmonary sarcoidosis  ([place]), Right knee pain (01/26/2017), Vocal cord dysfunction, and Wears partial dentures.  PAST SURGICAL HISTORY: She  has a past surgical history that includes Ventral hernia repair; Nissen fundoplication (7425); Cholecystectomy (1984); Abdominal hysterectomy; Knee arthroscopy; Tubal ligation; Bladder suspension (11/11/2011); Cystoscopy (11/11/2011); doppler echocardiography (02/12/2013); LexiScan Myoview (03/09/2013); Cardiac catheterization (07/2010); CAROTIDS (02/18/11); Right and left CARDIAC CATHETERIZATION (04/23/2013); Metatarsal osteotomy with open reduction, internal fixation (orif) metatarsal with fusion (Left, 04/09/2014); left and right heart catheterization with coronary angiogram (N/A, 04/23/2013); Hernia repair; Appendectomy; Fracture surgery; I&D extremity (Left, 06/27/2015); LEFT HEART CATH AND CORONARY ANGIOGRAPHY (N/A, 03/11/2017); and Total knee arthroplasty (Right, 06/29/2017).  Allergies  Allergen Reactions  . Methotrexate Other (See Comments)    Peri-oral and buccal lesions  . Vancomycin Other (See Comments)    Nephrotoxicity   . Clindamycin/Lincomycin Nausea And Vomiting and Rash  . Chlorhexidine Itching  . Doxycycline Rash  . Lisinopril Cough  . Teflaro [Ceftaroline] Rash    No current facility-administered medications on file prior to encounter.    Current Outpatient Medications on File Prior to Encounter  Medication Sig  . albuterol (PROVENTIL HFA;VENTOLIN HFA) 108 (90 Base) MCG/ACT inhaler Inhale 1-2 puffs into the lungs every 6 (six) hours as needed for wheezing or shortness of breath.  . allopurinol (ZYLOPRIM) 100 MG tablet Take 100 mg by mouth at bedtime.   [name] aspirin EC 81 MG tablet Take 81 mg by mouth daily.   [name] atorvastatin (LIPITOR) 40 MG tablet TAKE 1 TAB BY MOUTH ONCE DAILY (Patient taking differently: Take 40 mg by mouth once a day)  . benzonatate (TESSALON) 200 MG capsule Take 200 mg by mouth 3 (three) times daily as needed for cough.   [name] buPROPion (WELLBUTRIN XL)  300 MG 24 hr tablet TAKE 1 TABLET (300  MG TOTAL) BY MOUTH DAILY.  . Calcium Citrate 200 MG TABS Take 200 mg by mouth daily.  . carvedilol (COREG) 25 MG tablet Take 1 tablet (25 mg total) by mouth 2 (two) times daily with a meal.  . chlorpheniramine-HYDROcodone (TUSSIONEX) 10-8 MG/5ML SUER Take 5 mLs by mouth every 12 (twelve) hours as needed for cough.  . dexlansoprazole (DEXILANT) 60 MG capsule Take 1 capsule (60 mg total) by mouth daily.  . diclofenac sodium (VOLTAREN) 1 % GEL APPLY 2 GRAMS TOPICALLY 4 TIMES A DAY (Patient taking differently: APPLY 2 GRAMS TOPICALLY 4 TIMES A DAY AS NEEDED FOR KNEE PAIN)  . diltiazem (CARDIZEM CD) 120 MG 24 hr capsule Take 1 capsule (120 mg total) by mouth daily.  . fenofibrate (TRICOR) 145 MG tablet TAKE 1 TABLET BY MOUTH DAILY (Patient taking differently: Take 145 mg by mouth once a day)  . fluticasone (FLONASE) 50 MCG/ACT nasal spray Place 2 sprays into both nostrils daily.  . fluticasone furoate-vilanterol (BREO ELLIPTA) 200-25 MCG/INH AEPB Inhale 1 puff into the lungs daily.  . furosemide (LASIX) 40 MG tablet TAKE 1 TABLET BY MOUTH TWICE A DAY (Patient taking differently: Take 40 mg by mouth two times a day)  . gabapentin (NEURONTIN) 100 MG capsule Take 1 capsule (100 mg total) by mouth 3 (three) times daily.  . insulin aspart (NOVOLOG FLEXPEN) 100 UNIT/ML FlexPen Inject 25-30 units in the skin 3 times daily with meals  . Insulin Glargine (TOUJEO SOLOSTAR) 300 UNIT/ML SOPN Inject 40 Units at bedtime into the skin.  [name] LORazepam (ATIVAN) 0.5 MG tablet Take 0.5 mg by mouth every 12 (twelve) hours as needed for anxiety.  . metFORMIN (GLUCOPHAGE) 1000 MG tablet Take 1 tablet (1,000 mg total) by mouth 2 (two) times daily with a meal.  . nitroGLYCERIN (NITROSTAT) 0.4 MG SL tablet Place 1 tablet (0.4 mg total) under the tongue every 5 (five) minutes as needed for chest pain. Reported on 01/05/2016  . ondansetron (ZOFRAN-ODT) 4 MG disintegrating tablet Take 4 mg by  mouth every 8 (eight) hours as needed for nausea or vomiting.   [name] oxyCODONE-acetaminophen (ROXICET) 5-325 MG tablet Take 1 tablet by mouth every 4 (four) hours as needed for severe pain.  [name] PARoxetine Mesylate (BRISDELLE) 7.5 MG CAPS Take 7.5 mg by mouth daily.  . Potassium Chloride ER 20 MEQ TBCR Take 1 tablet by mouth 3 (three) times daily. (Patient taking differently: Take 20 mEq by mouth 3 (three) times daily. )  . predniSONE (DELTASONE) 5 MG tablet Take 5 mg by mouth daily with breakfast.  . traMADol (ULTRAM) 50 MG tablet Take 1 tablet (50 mg total) by mouth every 12 (twelve) hours as needed for moderate pain or severe pain.  [name] triamcinolone cream (KENALOG) 0.1 % Apply 1 application topically daily as needed (for irritation).   . vitamin B-12 (CYANOCOBALAMIN) 1000 MCG tablet Take 1,000 mcg by mouth daily.  [name] BAYER MICROLET LANCETS lancets Use as instructed to check sugar 3 times daily  . Blood Glucose Monitoring Suppl (BAYER CONTOUR MONITOR) w/Device KIT Use daily to check sugar  . glucose blood (BAYER CONTOUR TEST) test strip Use as instructed to check sugar 3 times daily  . HYDROcodone-acetaminophen (NORCO/VICODIN) 5-325 MG tablet Take 1 tablet by mouth every 6 (six) hours as needed for moderate pain.   . Insulin Glargine (TOUJEO MAX SOLOSTAR) 300 UNIT/ML SOPN Inject 45 Units into the skin at bedtime. (Patient not taking: Reported on 09/20/2017)  . Insulin Pen Needle 33G  X 4 MM MISC 1 each by Does not apply route 4 (four) times daily.  . mometasone-formoterol (DULERA) 200-5 MCG/ACT AERO Inhale 2 puffs into the lungs 2 (two) times daily. (Patient not taking: Reported on 09/20/2017)  . orlistat (XENICAL) 120 MG capsule Take 1 capsule (120 mg total) by mouth 3 (three) times daily with meals. (Patient not taking: Reported on 09/20/2017)  . predniSONE (DELTASONE) 20 MG tablet Take 2 pills for 5 days (Patient not taking: Reported on 09/20/2017)  . venlafaxine XR (EFFEXOR-XR) 75 MG 24 hr capsule  Take 2 capsules (150 mg total) by mouth at bedtime.    FAMILY HISTORY:  Her indicated that her mother is alive. She indicated that her father is alive. She indicated that the status of her brother is unknown. She indicated that her maternal grandfather is deceased. She indicated that the status of her maternal uncle is unknown. She indicated that the status of her neg hx is unknown.   SOCIAL HISTORY: She  reports that  has never smoked. she has never used smokeless tobacco. She reports that she does not drink alcohol or use drugs.  REVIEW OF SYSTEMS:   Unable to obtain as pt is encephalopathic.  SUBJECTIVE:  On vent.  VITAL SIGNS: BP (!) 156/65   Pulse (!) 105   Temp 98.9 F (37.2 C) (Oral)   Resp 13   SpO2 96%   HEMODYNAMICS:    VENTILATOR SETTINGS:    INTAKE / OUTPUT: I/O last 3 completed shifts: In: 1000 [IV Piggyback:1000] Out: -    PHYSICAL EXAMINATION: General: Adult female, resting in bed, in NAD. Neuro: Sedated but follows commands. HEENT: Nasally intubated.  [place]/AT. PERRL, sclerae anicteric. Cardiovascular: RRR, no M/R/G.  Lungs: Respirations even and unlabored.  CTA bilaterally, No W/R/R. Abdomen: Obese, BS x 4, soft, NT/ND.  Musculoskeletal: No gross deformities, no edema.  Skin: Intact, warm, no rashes.   LABS:  BMET Recent Labs  Lab 09/20/17 1431  NA 140  K 3.4*  CL 104  BUN 8  CREATININE 0.70  GLUCOSE 237*    Electrolytes No results for input(s): CALCIUM, MG, PHOS in the last 168 hours.  CBC Recent Labs  Lab 09/20/17 1418 09/20/17 1431  WBC 21.5*  --   HGB 10.4* 10.5*  HCT 33.0* 31.0*  PLT 359  --     Coag's No results for input(s): APTT, INR in the last 168 hours.  Sepsis Markers No results for input(s): LATICACIDVEN, PROCALCITON, O2SATVEN in the last 168 hours.  ABG No results for input(s): PHART, PCO2ART, PO2ART in the last 168 hours.  Liver Enzymes No results for input(s): AST, ALT, ALKPHOS, BILITOT, ALBUMIN in the  last 168 hours.  Cardiac Enzymes No results for input(s): TROPONINI, PROBNP in the last 168 hours.  Glucose No results for input(s): GLUCAP in the last 168 hours.  Imaging Dg Chest 2 View  Result Date: 09/20/2017 CLINICAL DATA:  Pain and dysphagia EXAM: CHEST  2 VIEW COMPARISON:  August 12, 2017. FINDINGS: There is no edema or consolidation. Heart is slightly enlarged with pulmonary vascularity within normal limits. No adenopathy. No bone lesions. No pneumothorax or pneumomediastinum. IMPRESSION: Heart mildly enlarged.  No edema or consolidation. Electronically Signed   By: Lowella Grip III M.D.   On: 09/20/2017 15:10   Ct Soft Tissue Neck W Contrast  Result Date: 09/20/2017 CLINICAL DATA:  Sore throat with difficulty swallowing for 2 days. Leukocytosis. EXAM: CT NECK WITH CONTRAST TECHNIQUE: Multidetector CT imaging of the neck  was performed using the standard protocol following the bolus administration of intravenous contrast. CONTRAST:  56m ISOVUE-300 IOPAMIDOL (ISOVUE-300) INJECTION 61% COMPARISON:  11/30/2015 FINDINGS: Pharynx and larynx: There is diffuse soft tissue thickening involving the posterior oropharyngeal wall inferiorly, with more extensive soft tissue thickening throughout the hypopharynx. The epiglottis is thickened, as are the aryepiglottic folds (greater on the left). No focal mass is identified. There is mild retropharyngeal edema and mild parapharyngeal fat stranding. No abscess is identified. The supraglottic airway is mildly narrowed. Salivary glands: No inflammation, mass, or stone. Thyroid: Unremarkable. Lymph nodes: No enlarged or suspicious lymph nodes in the neck. Vascular: The major vascular structures of the neck appear patent. Limited intracranial: Unremarkable. Visualized orbits: Bilateral cataract extraction. Mastoids and visualized paranasal sinuses: Clear. Skeleton: No acute osseous abnormality or suspicious osseous lesion. Upper chest: Clear lung apices.  Other: None. IMPRESSION: Diffuse inferior pharyngeal soft tissue thickening with involvement of the epiglottis and supraglottic larynx, likely infectious. Correlate with direct visualization and clinical follow-up to exclude neoplasm. No abscess. Electronically Signed   By: ALogan BoresM.D.   On: 09/20/2017 15:19     STUDIES:  CXR 12/11 > No acute process. CT soft tissue neck 12/11 > soft tissue thickening with involvement of the epiglottis and supraglottic larynx.  CULTURES: Strep 12/11 >  Sputum 12/11 >   ANTIBIOTICS: Vanc 12/11 >  Ceftriaxone 12/11 >   SIGNIFICANT EVENTS: 12/11 > admit.  LINES/TUBES: Nasal ETT 12/11 >   DISCUSSION: 56y.o. female admitted 12/11 with supraglottitis.  She required awake fiberoptic nasal  intubation in OR for airway protection.  Started on abx and steroids and PCCM asked to assist with vent management.  ASSESSMENT / PLAN:  PULMONARY A: Respiratory insufficiency - s/p awake fiberoptic nasal intubation in OR due to concern for loss of airway in setting of supraglottitis. Hx pulmonary sarcoidosis (diagnosed by mediastinoscopy in 2008, followed by Dr. MVaughan Browner- previously Dr. WJoya Gaskins [facility], COPD, VCD. P:   Full vent support. Wean as able. VAP prevention measures. Daily SBT but no extubation for at least 2 - 3 days. Continue decadron, BD's. Once off decadron, then continue prednisone daily (chronically on pred for sarcoid). CXR in AM.  CARDIOVASCULAR A:  Hx HTN, HLD, dCHF (Echo from Nov 2015 with EF 55-60%, G1DD). P:  Monitor hemodynamics. Hold preadmission ASA, atorvastatin, fenofibrate, carvedilol, diltiazem, furosemide.  RENAL A:   Hypokalemia. P:   K x 4 runs. NS @ 100. BMP in AM.  GASTROINTESTINAL A:   Nutrition. Hx GERD, obesity. P:   NPO. SUP: Pantoprazole. Will hold off on placing NGT for now given nasal intubation.  HEMATOLOGIC A:   Anemia - chronic. VTE Prophylaxis. P:  Transfuse for  Hgb < 7. SCD's / heparin. CBC in AM.  INFECTIOUS A:   Supraglottitis. P:   Abx as above (vanc / ceftriaxone).  Follow cultures as above.  ENDOCRINE A:   Hx DM II. P:   SSI. Hold preadmission metformin, insulin.  NEUROLOGIC A:   Sedation due to mechanical ventilation. P:   Sedation:  Propofol gtt / fentanyl PRN. RASS goal: 0 to -1. Daily WUA. Hold preadmission gabapentin, norco, lorazepam, roxicet, paroxetine mesylate, venlafaxine.  Family updated: None available.  Interdisciplinary Family Meeting v Palliative Care Meeting:  Due by: 09/26/17.  CC time: 30 min.   RMontey Hora PUtah- C [place] [facility] Pager: [phone] or ([phone]/08/2017,  8:06 PM

## 2017-09-20 NOTE — ED Provider Notes (Addendum)
[facility] Provider Note   CSN: [id number] Arrival date & time: 09/20/17  1300     History   Chief Complaint Chief Complaint  Patient presents with  . sore throat/chest wall pain    HPI [name] is a 56 y.o. female.  HPI Patient presents with sore throat difficulty swallowing and some difficult to breathing.  Has had for the last few days.  Was seen at an urgent care and started on Keflex for presumed sinusitis.  Since then more difficulty swallowing.  States she cannot eat or drink and cannot take her pills.  She has had a change in her voice.  Also pain in her upper chest.  States that the throat came first.  Pain swallowing and even without swelling.  Pain is on both sides.  She is on low-dose steroids for sarcoidosis.  Has had a cough with some mild sputum production. Past Medical History:  Diagnosis Date  . Abnormal SPEP 04/17/2014  . Acute left ankle pain 01/26/2017  . ANEMIA-UNSPECIFIED 09/18/2009  . CHF (congestive heart failure) ([place])   . Chronic diastolic heart failure, NYHA class 2 (HCC)    LVEDP roughly 20% by cath  . COPD (chronic obstructive pulmonary disease) ([place])   . Depression   . DIABETES MELLITUS, TYPE II 08/21/2006  . Diabetic osteomyelitis ([place]) 05/29/2015  . Fracture of 5th metatarsal    non union  . GERD 08/21/2006  . GOUT 08/20/2010  . Hx of umbilical hernia repair   . HYPERLIPIDEMIA 08/21/2006  . HYPERTENSION 08/21/2006  . Infection of wound due to methicillin resistant Staphylococcus aureus (MRSA)   . Internal hemorrhoids   . Multiple allergies 10/14/2016  . OBESITY 06/04/2009  . Onychomycosis 10/27/2015  . Osteomyelitis of left foot ([place]) 05/29/2015  . Pulmonary sarcoidosis ([place])    Followed locally by pulmonology, but also by Dr. Casper Harrison at [facility]  . Right knee pain 01/26/2017  . Vocal cord dysfunction   . Wears partial dentures     Patient Active Problem List   Diagnosis Date Noted  . Dysphagia  09/20/2017  . Epiglottitis   . Difficult airway for intubation   . Chronic diastolic CHF (congestive heart failure) ([place]) 08/13/2017  . UTI (urinary tract infection) 08/12/2017  . Sepsis ([place]) 08/12/2017  . Plantar fasciitis, right 07/13/2017  . S/P total knee arthroplasty, right 06/29/2017  . Osteoarthrosis, localized, primary, knee, right   . Sprain of calcaneofibular ligament of right ankle 06/06/2017  . Acute left ankle pain 01/26/2017  . Osteoarthritis of right knee 01/26/2017  . Multiple allergies 10/14/2016  . Onychomycosis 10/27/2015  . Osteomyelitis of left foot ([place]) 05/29/2015  . CKD (chronic kidney disease) stage 3, GFR 30-59 ml/min (HCC) 04/27/2015  . MRSA (methicillin resistant staph aureus) culture positive 03/27/2015  . Wound infection complicating hardware ([place]) 03/27/2015  . Fatty liver 09/30/2014  . Hot flashes 07/15/2014  . Abnormal SPEP 04/17/2014  . Fracture of left leg 04/17/2014  . Cushingoid side effect of steroids ([place]) 04/17/2014  . Internal hemorrhoids   . Depression   . Preoperative clearance 03/25/2014  . COPD with acute exacerbation ([place]) 03/14/2014  . Acute hypoxemic respiratory failure ([place]) 12/05/2013  . COPD exacerbation ([place]) 12/02/2013  . Obstructive chronic bronchitis without exacerbation ([place]) 09/18/2013  . Chest pain 04/11/2013  . Hypertensive heart disease with chronic diastolic congestive heart failure ([place])   . Solitary pulmonary nodule, on CT 02/2013 - stable over 2 years in 2015 02/20/2013  .  Gout 08/20/2010  . Anemia 09/18/2009  . Morbid obesity ([place]) 06/04/2009  . Sleep apnea 04/21/2009  . Sarcoidosis of lung ([place]) 04/10/2007  . Hyperlipidemia 08/21/2006  . Essential hypertension 08/21/2006  . GERD 08/21/2006  . Type II diabetes mellitus with renal manifestations ([place]) 08/21/2006  . Diabetes mellitus type 2, uncontrolled ([place]) 08/21/2006    Past Surgical History:  Procedure Laterality Date  . ABDOMINAL HYSTERECTOMY    .  APPENDECTOMY    . BLADDER SUSPENSION  11/11/2011   Procedure: TRANSVAGINAL TAPE (TVT) PROCEDURE;  Surgeon: Olga Millers, MD;  Location: [facility];  Service: Gynecology;  Laterality: N/A;  . CARDIAC CATHETERIZATION  07/2010   LVEF 50-55% WITH VERY MILD GLOBAL HYPOKINESIA; ESSENTIALLY NORMAL CORONARY ARTERIES; NORMAL LV FUNCTION  . CAROTIDS  02/18/11   CAROTID DUPLEX; VERTEBRALS ARE PATENT WITH ANTEGRADE FLOW. ICA/CCA RATIO 1.61 ON RIGHT AND 0.75 ON LEFT  . CHOLECYSTECTOMY  1984  . CYSTOSCOPY  11/11/2011   Procedure: CYSTOSCOPY;  Surgeon: Olga Millers, MD;  Location: [facility];  Service: Gynecology;  Laterality: N/A;  . DOPPLER ECHOCARDIOGRAPHY  02/12/2013   LV FUNCTION, SIZE NORMAL; MILD CONCENTRIC LVH; EST EF 55-65%; WALL MOTION NORMAL  . EXTUBATION (ENDOTRACHEAL) IN OR N/A 09/23/2017   Procedure: EXTUBATION (ENDOTRACHEAL) IN OR;  Surgeon: Helayne Seminole, MD;  Location: [place];  Service: ENT;  Laterality: N/A;  . FIBEROPTIC LARYNGOSCOPY AND TRACHEOSCOPY N/A 09/23/2017   Procedure: FLEXIBLE FIBEROPTIC LARYNGOSCOPY;  Surgeon: Helayne Seminole, MD;  Location: [place];  Service: ENT;  Laterality: N/A;  . FRACTURE SURGERY     foot  . HERNIA REPAIR    . I&D EXTREMITY Left 06/27/2015   Procedure: Partial Excision Left Calcaneus, Place Antibiotic Beads, and Wound VAC;  Surgeon: Newt Minion, MD;  Location: [place];  Service: Orthopedics;  Laterality: Left;  . KNEE ARTHROSCOPY     right  . LEFT AND RIGHT HEART CATHETERIZATION WITH CORONARY ANGIOGRAM N/A 04/23/2013   Procedure: LEFT AND RIGHT HEART CATHETERIZATION WITH CORONARY ANGIOGRAM;  Surgeon: Leonie Man, MD;  Location: [place] [facility];  Service: Cardiovascular;  Laterality: N/A;  . LEFT HEART CATH AND CORONARY ANGIOGRAPHY N/A 03/11/2017   Procedure: Left Heart Cath and Coronary Angiography;  Surgeon: Sherren Mocha, MD;  Location: [place] [facility];  Service: Cardiovascular;  Laterality: N/A;  . LexiScan Myoview  03/09/2013   EF 50%; NORMAL  MYOCARDIAL PERFUSION STUDY - breast attenuation  . METATARSAL OSTEOTOMY WITH OPEN REDUCTION INTERNAL FIXATION (ORIF) METATARSAL WITH FUSION Left 04/09/2014   Procedure: LEFT FOOT FRACTURE OPEN TREATMENT METATARSAL INCLUDES INTERNAL FIXATION EACH;  Surgeon: Lorn Junes, MD;  Location: [place];  Service: Orthopedics;  Laterality: Left;  . NISSEN FUNDOPLICATION  3545  . Right and left CARDIAC CATHETERIZATION  04/23/2013   Angiographic normal coronaries; LVEDP 20 mmHg, PCWP 12-14 mmHg, RAP 12 mmHg.; Fick CO/CI 4.9/2.2  . TOTAL KNEE ARTHROPLASTY Right 06/29/2017   Procedure: RIGHT TOTAL KNEE ARTHROPLASTY;  Surgeon: Newt Minion, MD;  Location: [place];  Service: Orthopedics;  Laterality: Right;  . TRACHEOSTOMY TUBE PLACEMENT N/A 09/20/2017   Procedure: AWAKE INTUBATION WITH ANESTHESIA WITH VIDEO ASSISTANCE;  Surgeon: Helayne Seminole, MD;  Location: [place];  Service: ENT;  Laterality: N/A;  . TUBAL LIGATION     with reversal in 1994  . VENTRAL HERNIA REPAIR      OB History    No data available       Home Medications  Prior to Admission medications   Medication Sig Start Date End Date Taking? Authorizing Provider  albuterol (PROVENTIL HFA;VENTOLIN HFA) 108 (90 Base) MCG/ACT inhaler Inhale 1-2 puffs into the lungs every 6 (six) hours as needed for wheezing or shortness of breath. 08/05/17  Yes Vivi Barrack, MD  allopurinol (ZYLOPRIM) 100 MG tablet Take 100 mg by mouth at bedtime.    Yes [provider]  aspirin EC 81 MG tablet Take 81 mg by mouth daily.    Yes [provider]  atorvastatin (LIPITOR) 40 MG tablet TAKE 1 TAB BY MOUTH ONCE DAILY Patient taking differently: Take 40 mg by mouth once a day 07/18/17  Yes [name], MD  benzonatate (TESSALON) 200 MG capsule Take 200 mg by mouth 3 (three) times daily as needed for cough.  09/19/13  Yes [provider]  buPROPion (WELLBUTRIN XL) 300 MG 24 hr tablet TAKE 1 TABLET (300 MG  TOTAL) BY MOUTH DAILY. 12/10/16  Yes [name], MD  Calcium Citrate 200 MG TABS Take 200 mg by mouth daily.   Yes [provider]  carvedilol (COREG) 25 MG tablet Take 1 tablet (25 mg total) by mouth 2 (two) times daily with a meal. 12/10/16  Yes Leonie Man, MD  chlorpheniramine-HYDROcodone (TUSSIONEX) 10-8 MG/5ML SUER Take 5 mLs by mouth every 12 (twelve) hours as needed for cough. 09/09/17  Yes [provider]  dexlansoprazole (DEXILANT) 60 MG capsule Take 1 capsule (60 mg total) by mouth daily. 08/05/17  Yes Mannam, Praveen, MD  diclofenac sodium (VOLTAREN) 1 % GEL APPLY 2 GRAMS TOPICALLY 4 TIMES A DAY Patient taking differently: APPLY 2 GRAMS TOPICALLY 4 TIMES A DAY AS NEEDED FOR KNEE PAIN 05/03/17  Yes [name], MD  diltiazem (CARDIZEM CD) 120 MG 24 hr capsule Take 1 capsule (120 mg total) by mouth daily. 12/30/16  Yes Leonie Man, MD  fenofibrate (TRICOR) 145 MG tablet TAKE 1 TABLET BY MOUTH DAILY Patient taking differently: Take 145 mg by mouth once a day 07/25/17  Yes Leonie Man, MD  fluticasone [facility]) 50 MCG/ACT nasal spray Place 2 sprays into both nostrils daily. 04/29/17  Yes [name], MD  fluticasone furoate-vilanterol (BREO ELLIPTA) 200-25 MCG/INH AEPB Inhale 1 puff into the lungs daily.   Yes [provider]  gabapentin (NEURONTIN) 100 MG capsule Take 1 capsule (100 mg total) by mouth 3 (three) times daily. 09/06/17  Yes Renato Shin, MD  insulin aspart (NOVOLOG FLEXPEN) 100 UNIT/ML FlexPen Inject 25-30 units in the skin 3 times daily with meals 09/06/17  Yes Renato Shin, MD  Insulin Glargine (TOUJEO SOLOSTAR) 300 UNIT/ML SOPN Inject 40 Units at bedtime into the skin. 08/29/17  Yes Renato Shin, MD  LORazepam (ATIVAN) 0.5 MG tablet Take 0.5 mg by mouth every 12 (twelve) hours as needed for anxiety.   Yes [provider]  metFORMIN (GLUCOPHAGE) 1000 MG tablet Take 1 tablet (1,000 mg total) by mouth 2 (two)  times daily with a meal. 06/29/17  Yes Renato Shin, MD  nitroGLYCERIN (NITROSTAT) 0.4 MG SL tablet Place 1 tablet (0.4 mg total) under the tongue every 5 (five) minutes as needed for chest pain. Reported on 01/05/2016 03/12/17  Yes Mikhail, Velta Addison, DO  ondansetron (ZOFRAN-ODT) 4 MG disintegrating tablet Take 4 mg by mouth every 8 (eight) hours as needed for nausea or vomiting.  03/31/15  Yes [provider]  oxyCODONE-acetaminophen (ROXICET) 5-325 MG tablet Take 1 tablet by mouth every 4 (four)  hours as needed for severe pain. 07/01/17  Yes Newt Minion, MD  PARoxetine Mesylate (BRISDELLE) 7.5 MG CAPS Take 7.5 mg by mouth daily.   Yes [provider]  Potassium Chloride ER 20 MEQ TBCR Take 1 tablet by mouth 3 (three) times daily. Patient taking differently: Take 20 mEq by mouth 3 (three) times daily.  05/20/17  Yes [name], MD  predniSONE (DELTASONE) 5 MG tablet Take 5 mg by mouth daily with breakfast.   Yes [provider]  traMADol (ULTRAM) 50 MG tablet Take 1 tablet (50 mg total) by mouth every 12 (twelve) hours as needed for moderate pain or severe pain. 03/02/17  Yes [name], MD  triamcinolone cream (KENALOG) 0.1 % Apply 1 application topically daily as needed (for irritation).  03/23/16  Yes [provider]  vitamin B-12 (CYANOCOBALAMIN) 1000 MCG tablet Take 1,000 mcg by mouth daily.   Yes [provider]  BAYER MICROLET LANCETS lancets Use as instructed to check sugar 3 times daily 04/06/17   Philemon Kingdom, MD  Blood Glucose Monitoring Suppl (BAYER CONTOUR MONITOR) w/Device KIT Use daily to check sugar 04/06/17   Philemon Kingdom, MD  furosemide (LASIX) 40 MG tablet Take 1 tablet (40 mg total) by mouth 2 (two) times daily. 09/21/17   Leonie Man, MD  glucose blood (BAYER CONTOUR TEST) test strip Use as instructed to check sugar 3 times daily 04/06/17   Philemon Kingdom, MD  Insulin Pen Needle 33G X 4 MM MISC 1 each by Does not  apply route 4 (four) times daily. 01/15/16   Philemon Kingdom, MD  sulfamethoxazole-trimethoprim (BACTRIM DS,SEPTRA DS) 800-160 MG tablet Take 1 tablet by mouth 2 (two) times daily for 7 days. 09/26/17 10/03/17  Helayne Seminole, MD  venlafaxine XR (EFFEXOR-XR) 75 MG 24 hr capsule Take 2 capsules (150 mg total) by mouth at bedtime. 12/05/15   Modena Jansky, MD    Family History Family History  Problem Relation Age of Onset  . Diabetes Father   . Heart attack Father 58  . Coronary artery disease Father   . Heart failure Father   . COPD Mother   . Emphysema Mother   . Asthma Mother   . Heart failure Mother   . Breast cancer Mother   . Heart attack Maternal Grandfather   . Sarcoidosis Maternal Uncle   . Lung cancer Brother   . Diabetes Brother   . Colon cancer Neg Hx     Social History Social History   Tobacco Use  . Smoking status: Never Smoker  . Smokeless tobacco: Never Used  Substance Use Topics  . Alcohol use: No    Alcohol/week: 0.0 oz  . Drug use: No     Allergies   Methotrexate; Vancomycin; Clindamycin/lincomycin; Chlorhexidine; Doxycycline; Lisinopril; and Teflaro [ceftaroline]   Review of Systems Review of Systems  Constitutional: Negative for chills and fever.  HENT: Positive for congestion, sore throat and voice change. Negative for drooling and nosebleeds.   Respiratory: Positive for shortness of breath.   Cardiovascular: Negative for chest pain.  Gastrointestinal: Negative for abdominal pain.  Endocrine: Negative for polyuria.  Genitourinary: Negative for flank pain.  Musculoskeletal: Negative for back pain.  Skin: Negative for rash.  Neurological: Positive for weakness.  Hematological: Does not bruise/bleed easily.  Psychiatric/Behavioral: Negative for confusion.     Physical Exam Updated Vital Signs BP 140/80   Pulse 83   Temp 98.6 F (37 C) (Oral)   Resp [name])  24   Ht _0  (1.676 m)   Wt 110.3 kg (243 lb 2.7 oz)   SpO2 100%   BMI  39.25 kg/m   Physical Exam  Constitutional: She appears well-developed.  HENT:  Posterior pharynx with slight erythema.  Uvula midline.  No tonsillar swelling.  Does have a quiet voice.  Somewhat harsh voice also.  Coughed up some bloody sputum  Eyes: EOM are normal.  Neck: Neck supple.  Cardiovascular: Normal rate.  Pulmonary/Chest:  Harsh breath sounds diffusely  Abdominal: There is no tenderness.  Musculoskeletal: She exhibits no edema.  Neurological: She is alert.  Skin: Skin is warm. Capillary refill takes less than 2 seconds.  Psychiatric: She has a normal mood and affect.     ED Treatments / Results  Labs (all labs ordered are listed, but only abnormal results are displayed) Labs Reviewed  MRSA PCR SCREENING - Abnormal; Notable for the following components:      Result Value   MRSA by PCR POSITIVE (*)    All other components within normal limits  CBC WITH DIFFERENTIAL/PLATELET - Abnormal; Notable for the following components:   WBC 21.5 (*)    Hemoglobin 10.4 (*)    HCT 33.0 (*)    RDW 15.6 (*)    Neutro Abs 18.0 (*)    Monocytes Absolute 1.4 (*)    All other components within normal limits  HEMOGLOBIN A1C - Abnormal; Notable for the following components:   Hgb A1c MFr Bld 8.7 (*)    All other components within normal limits  TRIGLYCERIDES - Abnormal; Notable for the following components:   Triglycerides 197 (*)    All other components within normal limits  BLOOD GAS, ARTERIAL - Abnormal; Notable for the following components:   pH, Arterial 7.295 (*)    pCO2 arterial 50.4 (*)    pO2, Arterial 303 (*)    All other components within normal limits  BASIC METABOLIC PANEL - Abnormal; Notable for the following components:   CO2 21 (*)    Glucose, Bld 247 (*)    Calcium 8.6 (*)    All other components within normal limits  CBC - Abnormal; Notable for the following components:   WBC 19.4 (*)    RBC 3.51 (*)    Hemoglobin 9.3 (*)    HCT 30.2 (*)    RDW 15.7 (*)     All other components within normal limits  GLUCOSE, CAPILLARY - Abnormal; Notable for the following components:   Glucose-Capillary 267 (*)    All other components within normal limits  GLUCOSE, CAPILLARY - Abnormal; Notable for the following components:   Glucose-Capillary 272 (*)    All other components within normal limits  GLUCOSE, CAPILLARY - Abnormal; Notable for the following components:   Glucose-Capillary 247 (*)    All other components within normal limits  GLUCOSE, CAPILLARY - Abnormal; Notable for the following components:   Glucose-Capillary 206 (*)    All other components within normal limits  GLUCOSE, CAPILLARY - Abnormal; Notable for the following components:   Glucose-Capillary 259 (*)    All other components within normal limits  GLUCOSE, CAPILLARY - Abnormal; Notable for the following components:   Glucose-Capillary 221 (*)    All other components within normal limits  BASIC METABOLIC PANEL - Abnormal; Notable for the following components:   Glucose, Bld 225 (*)    All other components within normal limits  GLUCOSE, CAPILLARY - Abnormal; Notable for the following components:   Glucose-Capillary 240 (*)  All other components within normal limits  GLUCOSE, CAPILLARY - Abnormal; Notable for the following components:   Glucose-Capillary 237 (*)    All other components within normal limits  GLUCOSE, CAPILLARY - Abnormal; Notable for the following components:   Glucose-Capillary 178 (*)    All other components within normal limits  GLUCOSE, CAPILLARY - Abnormal; Notable for the following components:   Glucose-Capillary 203 (*)    All other components within normal limits  GLUCOSE, CAPILLARY - Abnormal; Notable for the following components:   Glucose-Capillary 227 (*)    All other components within normal limits  GLUCOSE, CAPILLARY - Abnormal; Notable for the following components:   Glucose-Capillary 214 (*)    All other components within normal limits  CBC -  Abnormal; Notable for the following components:   WBC 12.8 (*)    RBC 3.49 (*)    Hemoglobin 9.2 (*)    HCT 29.6 (*)    All other components within normal limits  BASIC METABOLIC PANEL - Abnormal; Notable for the following components:   Glucose, Bld 220 (*)    All other components within normal limits  BLOOD GAS, ARTERIAL - Abnormal; Notable for the following components:   pH, Arterial 7.469 (*)    pO2, Arterial 198 (*)    All other components within normal limits  GLUCOSE, CAPILLARY - Abnormal; Notable for the following components:   Glucose-Capillary 194 (*)    All other components within normal limits  VANCOMYCIN, TROUGH - Abnormal; Notable for the following components:   Vancomycin Tr 14 (*)    All other components within normal limits  GLUCOSE, CAPILLARY - Abnormal; Notable for the following components:   Glucose-Capillary 199 (*)    All other components within normal limits  GLUCOSE, CAPILLARY - Abnormal; Notable for the following components:   Glucose-Capillary 217 (*)    All other components within normal limits  GLUCOSE, CAPILLARY - Abnormal; Notable for the following components:   Glucose-Capillary 199 (*)    All other components within normal limits  GLUCOSE, CAPILLARY - Abnormal; Notable for the following components:   Glucose-Capillary 196 (*)    All other components within normal limits  GLUCOSE, CAPILLARY - Abnormal; Notable for the following components:   Glucose-Capillary 166 (*)    All other components within normal limits  BASIC METABOLIC PANEL - Abnormal; Notable for the following components:   Potassium 3.0 (*)    Glucose, Bld 125 (*)    Calcium 8.8 (*)    All other components within normal limits  CBC - Abnormal; Notable for the following components:   RBC 3.67 (*)    Hemoglobin 9.5 (*)    HCT 31.7 (*)    MCH 25.9 (*)    All other components within normal limits  GLUCOSE, CAPILLARY - Abnormal; Notable for the following components:    Glucose-Capillary 138 (*)    All other components within normal limits  GLUCOSE, CAPILLARY - Abnormal; Notable for the following components:   Glucose-Capillary 149 (*)    All other components within normal limits  GLUCOSE, CAPILLARY - Abnormal; Notable for the following components:   Glucose-Capillary 111 (*)    All other components within normal limits  GLUCOSE, CAPILLARY - Abnormal; Notable for the following components:   Glucose-Capillary 121 (*)    All other components within normal limits  GLUCOSE, CAPILLARY - Abnormal; Notable for the following components:   Glucose-Capillary 160 (*)    All other components within normal limits  GLUCOSE, CAPILLARY -  Abnormal; Notable for the following components:   Glucose-Capillary 178 (*)    All other components within normal limits  GLUCOSE, CAPILLARY - Abnormal; Notable for the following components:   Glucose-Capillary 142 (*)    All other components within normal limits  GLUCOSE, CAPILLARY - Abnormal; Notable for the following components:   Glucose-Capillary 114 (*)    All other components within normal limits  GLUCOSE, CAPILLARY - Abnormal; Notable for the following components:   Glucose-Capillary 112 (*)    All other components within normal limits  GLUCOSE, CAPILLARY - Abnormal; Notable for the following components:   Glucose-Capillary 124 (*)    All other components within normal limits  GLUCOSE, CAPILLARY - Abnormal; Notable for the following components:   Glucose-Capillary 165 (*)    All other components within normal limits  GLUCOSE, CAPILLARY - Abnormal; Notable for the following components:   Glucose-Capillary 178 (*)    All other components within normal limits  GLUCOSE, CAPILLARY - Abnormal; Notable for the following components:   Glucose-Capillary 167 (*)    All other components within normal limits  GLUCOSE, CAPILLARY - Abnormal; Notable for the following components:   Glucose-Capillary 120 (*)    All other components  within normal limits  GLUCOSE, CAPILLARY - Abnormal; Notable for the following components:   Glucose-Capillary 115 (*)    All other components within normal limits  GLUCOSE, CAPILLARY - Abnormal; Notable for the following components:   Glucose-Capillary 117 (*)    All other components within normal limits  GLUCOSE, CAPILLARY - Abnormal; Notable for the following components:   Glucose-Capillary 152 (*)    All other components within normal limits  GLUCOSE, CAPILLARY - Abnormal; Notable for the following components:   Glucose-Capillary 167 (*)    All other components within normal limits  GLUCOSE, CAPILLARY - Abnormal; Notable for the following components:   Glucose-Capillary 185 (*)    All other components within normal limits  I-STAT CHEM 8, ED - Abnormal; Notable for the following components:   Potassium 3.4 (*)    Glucose, Bld 237 (*)    Hemoglobin 10.5 (*)    HCT 31.0 (*)    All other components within normal limits  RAPID STREP SCREEN (NOT AT [facility])  CULTURE, GROUP A STREP [facility])  MAGNESIUM  PHOSPHORUS  MAGNESIUM  PHOSPHORUS  MAGNESIUM  PHOSPHORUS    EKG  EKG Interpretation  Date/Time:  Tuesday September 20 2017 13:07:16 EST Ventricular Rate:  94 PR Interval:  158 QRS Duration: 86 QT Interval:  356 QTC Calculation: 445 R Axis:   63 Text Interpretation:  Normal sinus rhythm Cannot rule out Anterior infarct , age undetermined Abnormal ECG No significant change since last tracing Confirmed by Fredia Sorrow [phone]) on 09/21/2017 1:39:23 PM       Radiology Dg Swallowing Func-speech Pathology  Result Date: 09/25/2017 Objective Swallowing Evaluation: Type of Study: MBS-Modified Barium Swallow Study  Patient Details Name: [name] MRN: [id number] Date of Birth: 08-17-1961 Today's Date: 09/25/2017 Time: SLP Start Time (ACUTE ONLY): 1120 -SLP Stop Time (ACUTE ONLY): 1140 SLP Time Calculation (min) (ACUTE ONLY): 20 min Past Medical History: Past Medical History:  Diagnosis Date . Abnormal SPEP 04/17/2014 . Acute left ankle pain 01/26/2017 . ANEMIA-UNSPECIFIED 09/18/2009 . CHF (congestive heart failure) ([place])  . Chronic diastolic heart failure, NYHA class 2 (HCC)   LVEDP roughly 20% by cath . COPD (chronic obstructive pulmonary disease) ([place])  . Depression  . DIABETES MELLITUS, TYPE II 08/21/2006 . Diabetic osteomyelitis (  [place]) 05/29/2015 . Fracture of 5th metatarsal   non union . GERD 08/21/2006 . GOUT 08/20/2010 . Hx of umbilical hernia repair  . HYPERLIPIDEMIA 08/21/2006 . HYPERTENSION 08/21/2006 . Infection of wound due to methicillin resistant Staphylococcus aureus (MRSA)  . Internal hemorrhoids  . Multiple allergies 10/14/2016 . OBESITY 06/04/2009 . Onychomycosis 10/27/2015 . Osteomyelitis of left foot ([place]) 05/29/2015 . Pulmonary sarcoidosis ([place])   Followed locally by pulmonology, but also by Dr. Casper Harrison at [facility] . Right knee pain 01/26/2017 . Vocal cord dysfunction  . Wears partial dentures  Past Surgical History: Past Surgical History: Procedure Laterality Date . ABDOMINAL HYSTERECTOMY   . APPENDECTOMY   . BLADDER SUSPENSION  11/11/2011  Procedure: TRANSVAGINAL TAPE (TVT) PROCEDURE;  Surgeon: Olga Millers, MD;  Location: [facility];  Service: Gynecology;  Laterality: N/A; . CARDIAC CATHETERIZATION  07/2010  LVEF 50-55% WITH VERY MILD GLOBAL HYPOKINESIA; ESSENTIALLY NORMAL CORONARY ARTERIES; NORMAL LV FUNCTION . CAROTIDS  02/18/11  CAROTID DUPLEX; VERTEBRALS ARE PATENT WITH ANTEGRADE FLOW. ICA/CCA RATIO 1.61 ON RIGHT AND 0.75 ON LEFT . CHOLECYSTECTOMY  1984 . CYSTOSCOPY  11/11/2011  Procedure: CYSTOSCOPY;  Surgeon: Olga Millers, MD;  Location: [facility];  Service: Gynecology;  Laterality: N/A; . DOPPLER ECHOCARDIOGRAPHY  02/12/2013  LV FUNCTION, SIZE NORMAL; MILD CONCENTRIC LVH; EST EF 55-65%; WALL MOTION NORMAL . FRACTURE SURGERY    foot . HERNIA REPAIR   . I&D EXTREMITY Left 06/27/2015  Procedure: Partial Excision Left Calcaneus, Place Antibiotic Beads, and Wound  VAC;  Surgeon: Newt Minion, MD;  Location: [facility];  Service: Orthopedics;  Laterality: Left; . KNEE ARTHROSCOPY    right . LEFT AND RIGHT HEART CATHETERIZATION WITH CORONARY ANGIOGRAM N/A 04/23/2013  Procedure: LEFT AND RIGHT HEART CATHETERIZATION WITH CORONARY ANGIOGRAM;  Surgeon: Leonie Man, MD;  Location: [facility] CATH LAB;  Service: Cardiovascular;  Laterality: N/A; . LEFT HEART CATH AND CORONARY ANGIOGRAPHY N/A 03/11/2017  Procedure: Left Heart Cath and Coronary Angiography;  Surgeon: Sherren Mocha, MD;  Location: [facility];  Service: Cardiovascular;  Laterality: N/A; . LexiScan Myoview  03/09/2013  EF 50%; NORMAL MYOCARDIAL PERFUSION STUDY - breast attenuation . METATARSAL OSTEOTOMY WITH OPEN REDUCTION INTERNAL FIXATION (ORIF) METATARSAL WITH FUSION Left 04/09/2014  Procedure: LEFT FOOT FRACTURE OPEN TREATMENT METATARSAL INCLUDES INTERNAL FIXATION EACH;  Surgeon: Lorn Junes, MD;  Location: [facility];  Service: Orthopedics;  Laterality: Left; . NISSEN FUNDOPLICATION  1761 . Right and left CARDIAC CATHETERIZATION  04/23/2013  Angiographic normal coronaries; LVEDP 20 mmHg, PCWP 12-14 mmHg, RAP 12 mmHg.; Fick CO/CI 4.9/2.2 . TOTAL KNEE ARTHROPLASTY Right 06/29/2017  Procedure: RIGHT TOTAL KNEE ARTHROPLASTY;  Surgeon: Newt Minion, MD;  Location: [facility];  Service: Orthopedics;  Laterality: Right; . TRACHEOSTOMY TUBE PLACEMENT N/A 09/20/2017  Procedure: AWAKE INTUBATION WITH ANESTHESIA WITH VIDEO ASSISTANCE;  Surgeon: Helayne Seminole, MD;  Location: [facility];  Service: ENT;  Laterality: N/A; . TUBAL LIGATION    with reversal in 1994 . VENTRAL HERNIA REPAIR   HPI: 56 yo female presented with difficulty swallowing, stridor and chest pain x 2 days.  In ED, she was seen by ENT and had transnasal flexible laryngoscopy that demonstrated an erythematous and edematous epiglottis with small blister, very edematous arytenoids and false cords. Findings c/w supraglottitis. She was subsequently  taken to OR where she had an awake fiberoptic nasal intubation for airway protection. PMHx of Sarcoidosis with ocular involvement on 5 mg prednisone daily, Chronic bronchitis, HTN, HLD, Gout, GERD,  DM, Depression, Diastolic CHF.  Subjective: alert, pleasant Assessment / Plan / Recommendation CHL IP CLINICAL IMPRESSIONS 09/25/2017 Clinical Impression Patient presents with moderate pharyngeal and mild pharyngoesophageal dysphagia. Oral stage grossly WFL. Pharyngeal stage characterized by incomplete epiglottic deflection, decreased hyolaryngeal excursion and reduced pharyngeal constriction. Slightly anterior tilt of epiglottis; thin and nectar-thick liquids spill to pyriforms and occasionally into the airway prior to filling of valleculae, with penetration, aspiration before the swallow. Trace aspiration also occurs during the swallow with thin and nectar thick liquids due to decreased laryngeal closure. Pt inconsistently senses aspiration (moderate vs trace amounts). Intermittent penetration of honey, puree. Trained pt in supraglottic swallow maneuver with realtime video feedback. This manuever was effective for airway protection with honey and pureed consistencies, however pt had difficulty coordinating with thinner liquids. Mild-moderate pyriform residue with solids, which pt senses and clears with reflexive swallow or with self-initiated "hock" back into the oral cavity. Pharyngoesophageal phase notable for decreased amplitude of UES opening, secondary to decreased hyolaryngeal excursion. Recommend initiating dys 3 with honey thick liquids, medications crushed in puree. Pt to hold breath, swallow, and cough immediately after swallow to improve airway protection. Endoscopy 09/23/17 revealed improved appearance from initial endoscopy 09/20/17, but noted mildly edematous arytenoids, boggy edema of vocal cords. I anticipate improvements as edema improves and with use of swallowing musculature. Will follow for  tolerance, repeat instrumental, advancement of liquids when appropriate.  SLP Visit Diagnosis Dysphagia, pharyngeal phase (R13.13);Dysphagia, pharyngoesophageal phase (R13.14) Attention and concentration deficit following -- Frontal lobe and executive function deficit following -- Impact on safety and function Moderate aspiration risk   CHL IP TREATMENT RECOMMENDATION 09/25/2017 Treatment Recommendations F/U MBS in --- days (Comment);F/U FEES in --- days (Comment);Therapy as outlined in treatment plan below   Prognosis 09/25/2017 Prognosis for Safe Diet Advancement Good Barriers to Reach Goals -- Barriers/Prognosis Comment -- CHL IP DIET RECOMMENDATION 09/25/2017 SLP Diet Recommendations Dysphagia 3 (Mech soft) solids;Honey thick liquids Liquid Administration via Cup Medication Administration Crushed with puree Compensations Slow rate;Small sips/bites;Other (Comment) Postural Changes --   CHL IP OTHER RECOMMENDATIONS 09/25/2017 Recommended Consults -- Oral Care Recommendations Oral care BID Other Recommendations Order thickener from pharmacy;Clarify dietary restrictions;Remove water pitcher;Prohibited food (jello, ice cream, thin soups);Have oral suction available   CHL IP FOLLOW UP RECOMMENDATIONS 09/25/2017 Follow up Recommendations Other (comment)   CHL IP FREQUENCY AND DURATION 09/25/2017 Speech Therapy Frequency (ACUTE ONLY) min 2x/week Treatment Duration 2 weeks      CHL IP ORAL PHASE 09/25/2017 Oral Phase WFL Oral - Pudding Teaspoon -- Oral - Pudding Cup -- Oral - Honey Teaspoon -- Oral - Honey Cup -- Oral - Nectar Teaspoon -- Oral - Nectar Cup -- Oral - Nectar Straw -- Oral - Thin Teaspoon -- Oral - Thin Cup -- Oral - Thin Straw -- Oral - Puree -- Oral - Mech Soft -- Oral - Regular -- Oral - Multi-Consistency -- Oral - Pill -- Oral Phase - Comment --  CHL IP PHARYNGEAL PHASE 09/25/2017 Pharyngeal Phase Impaired Pharyngeal- Pudding Teaspoon -- Pharyngeal -- Pharyngeal- Pudding Cup -- Pharyngeal -- Pharyngeal-  Honey Teaspoon -- Pharyngeal -- Pharyngeal- Honey Cup Delayed swallow initiation-pyriform sinuses;Reduced pharyngeal peristalsis;Reduced epiglottic inversion;Reduced anterior laryngeal mobility;Reduced laryngeal elevation;Reduced airway/laryngeal closure;Penetration/Aspiration during swallow;Compensatory strategies attempted (with notebox) Pharyngeal Material enters airway, remains ABOVE vocal cords then ejected out;Material enters airway, CONTACTS cords and then ejected out Pharyngeal- Nectar Teaspoon -- Pharyngeal -- Pharyngeal- Nectar Cup Delayed swallow initiation-pyriform sinuses;Reduced pharyngeal peristalsis;Reduced epiglottic inversion;Reduced anterior laryngeal mobility;Reduced laryngeal elevation;Reduced airway/laryngeal  closure;Penetration/Aspiration before swallow;Penetration/Aspiration during swallow;Trace aspiration;Moderate aspiration;Compensatory strategies attempted (with notebox) Pharyngeal Material enters airway, CONTACTS cords and not ejected out;Material enters airway, passes BELOW cords without attempt by patient to eject out (silent aspiration);Material enters airway, passes BELOW cords and not ejected out despite cough attempt by patient Pharyngeal- Nectar Straw -- Pharyngeal -- Pharyngeal- Thin Teaspoon Delayed swallow initiation-pyriform sinuses;Reduced pharyngeal peristalsis;Reduced epiglottic inversion;Reduced anterior laryngeal mobility;Reduced laryngeal elevation;Reduced airway/laryngeal closure;Penetration/Aspiration before swallow;Penetration/Aspiration during swallow;Trace aspiration Pharyngeal Material enters airway, passes BELOW cords without attempt by patient to eject out (silent aspiration) Pharyngeal- Thin Cup Delayed swallow initiation-pyriform sinuses;Reduced pharyngeal peristalsis;Reduced epiglottic inversion;Reduced anterior laryngeal mobility;Reduced laryngeal elevation;Reduced airway/laryngeal closure;Penetration/Aspiration before swallow;Penetration/Aspiration during  swallow;Trace aspiration;Moderate aspiration Pharyngeal Material enters airway, passes BELOW cords and not ejected out despite cough attempt by patient;Material enters airway, passes BELOW cords without attempt by patient to eject out (silent aspiration) Pharyngeal- Thin Straw -- Pharyngeal -- Pharyngeal- Puree Delayed swallow initiation-vallecula;Reduced pharyngeal peristalsis;Reduced epiglottic inversion;Reduced anterior laryngeal mobility;Reduced laryngeal elevation;Reduced airway/laryngeal closure;Penetration/Aspiration during swallow;Pharyngeal residue - pyriform;Other (Comment) Pharyngeal Material enters airway, remains ABOVE vocal cords then ejected out Pharyngeal- Mechanical Soft -- Pharyngeal -- Pharyngeal- Regular Delayed swallow initiation-vallecula;Reduced pharyngeal peristalsis;Reduced epiglottic inversion;Reduced anterior laryngeal mobility;Reduced laryngeal elevation;Reduced airway/laryngeal closure;Pharyngeal residue - pyriform Pharyngeal -- Pharyngeal- Multi-consistency -- Pharyngeal -- Pharyngeal- Pill -- Pharyngeal -- Pharyngeal Comment --  CHL IP CERVICAL ESOPHAGEAL PHASE 09/25/2017 Cervical Esophageal Phase Impaired Pudding Teaspoon -- Pudding Cup -- Honey Teaspoon -- Honey Cup Reduced cricopharyngeal relaxation Nectar Teaspoon -- Nectar Cup -- Nectar Straw -- Thin Teaspoon -- Thin Cup -- Thin Straw -- Puree -- Mechanical Soft -- Regular -- Multi-consistency -- Pill -- Cervical Esophageal Comment -- Deneise Lever, MS, CCC-SLP Speech-Language Pathologist [phone] No flowsheet data found. Aliene Altes 09/25/2017, 1:27 PM               Procedures Procedures (including critical care time)  Medications Ordered in ED Medications  sodium chloride 0.9 % bolus 1,000 mL (0 mLs Intravenous Stopped 09/20/17 1533)  fentaNYL (SUBLIMAZE) injection 100 mcg (100 mcg Intravenous Given 09/20/17 1431)  iopamidol (ISOVUE-300) 61 % injection (75 mLs  Contrast Given 09/20/17 1444)  HYDROmorphone  (DILAUDID) injection 1 mg (1 mg Intravenous Given 09/20/17 1624)  Racepinephrine HCl 2.25 % nebulizer solution 0.5 mL (0.5 mLs Nebulization Given 09/20/17 1620)  oxymetazoline (AFRIN) 0.05 % nasal spray 1 spray (1 spray Each Nare Given 09/20/17 1636)  dexamethasone (DECADRON) injection 10 mg (10 mg Intravenous Given 09/20/17 1636)  cefTRIAXone (ROCEPHIN) 1 g in dextrose 5 % 50 mL IVPB (1 g Intravenous Given 09/20/17 1923)  dexamethasone (DECADRON) injection 10 mg (10 mg Intravenous Given 09/21/17 0506)  vancomycin (VANCOCIN) 2,000 mg in sodium chloride 0.9 % 500 mL IVPB (0 mg Intravenous Stopped 09/20/17 2330)  potassium chloride 10 mEq in 100 mL IVPB (0 mEq Intravenous Stopped 09/21/17 0200)  dexamethasone (DECADRON) injection 4 mg (4 mg Intravenous Given 09/23/17 0502)  mupirocin ointment (BACTROBAN) 2 % 1 application (1 application Nasal Given 09/25/17 0938)  Chlorhexidine Gluconate Cloth 2 % PADS 6 each (6 each Topical Not Given 09/25/17 1009)  furosemide (LASIX) injection 20 mg (20 mg Intravenous Given 09/22/17 2135)  potassium chloride 10 mEq in 100 mL IVPB (0 mEq Intravenous Stopped 09/22/17 1713)  magnesium sulfate IVPB 2 g 50 mL (0 g Intravenous Stopped 09/22/17 1002)  potassium chloride 10 OQH/476LY IVPB (  Duplicate 65/03/54 6568)  potassium chloride 10 mEq in 100 mL IVPB (0 mEq Intravenous Stopped 09/24/17 1241)  Initial Impression / Assessment and Plan / ED Course  I have reviewed the triage vital signs and the nursing notes.  Pertinent labs & imaging results that were available during my care of the patient were reviewed by me and considered in my medical decision making (see chart for details).     Patient with shortness of breath and sore throat.  Difficulty swallowing but able to handle secretions.  Some change in voice.  White count elevated.  On chronic low-dose steroids.  CT scan showed laryngitis and potential epiglottitis.  Discussed with ENT who will come and see  the patient in the ER recommend stepdown admission to internal medicine.  Handling secretions and airway at this time.  Patient feels better after pain medicines.  CRITICAL CARE Performed by: Davonna Belling Total critical care time: 30 minutes Critical care time was exclusive of separately billable procedures and treating other patients. Critical care was necessary to treat or prevent imminent or life-threatening deterioration. Critical care was time spent personally by me on the following activities: development of treatment plan with patient and/or surrogate as well as nursing, discussions with consultants, evaluation of patient's response to treatment, examination of patient, obtaining history from patient or surrogate, ordering and performing treatments and interventions, ordering and review of laboratory studies, ordering and review of radiographic studies, pulse oximetry and re-evaluation of patient's condition.  Seen by ENT and intubation recommended. Will be tubed in OR. D/w Dr Pearline Cables from [facility]. He recommends calling them for admission after she is intubated.    Final Clinical Impressions(s) / ED Diagnoses   Final diagnoses:  Difficult airway for intubation  Respiratory failure ([place])  Supraglottitis with airway obstruction    ED Discharge Orders        Ordered    Increase activity slowly     09/26/17 1243    Diet - low sodium heart healthy     09/26/17 1243    sulfamethoxazole-trimethoprim (BACTRIM DS,SEPTRA DS) 800-160 MG tablet  2 times daily     09/26/17 1247       Davonna Belling, MD 09/20/17 1645    Davonna Belling, MD 09/20/17 1728    Davonna Belling, MD 09/27/17 [phone]

## 2017-09-20 NOTE — ED Triage Notes (Signed)
[place] and has been unable to swallow x 2 days with chest pain. Was seen at an urgent care yesterday and prescribed antibiotic but cannot swallow and liquids, no intake x 2 days, unable to visualize throat on arrival

## 2017-09-21 ENCOUNTER — Encounter (HOSPITAL_COMMUNITY): Payer: Self-pay | Admitting: Otolaryngology

## 2017-09-21 ENCOUNTER — Inpatient Hospital Stay (HOSPITAL_COMMUNITY): Payer: BLUE CROSS/BLUE SHIELD

## 2017-09-21 ENCOUNTER — Other Ambulatory Visit: Payer: Self-pay

## 2017-09-21 DIAGNOSIS — T884XXS Failed or difficult intubation, sequela: Secondary | ICD-10-CM

## 2017-09-21 LAB — GLUCOSE, CAPILLARY
Glucose-Capillary: 206 mg/dL — ABNORMAL HIGH (ref 65–99)
Glucose-Capillary: 221 mg/dL — ABNORMAL HIGH (ref 65–99)
Glucose-Capillary: 240 mg/dL — ABNORMAL HIGH (ref 65–99)
Glucose-Capillary: 247 mg/dL — ABNORMAL HIGH (ref 65–99)
Glucose-Capillary: 259 mg/dL — ABNORMAL HIGH (ref 65–99)

## 2017-09-21 LAB — CBC
HCT: 30.2 % — ABNORMAL LOW (ref 36.0–46.0)
Hemoglobin: 9.3 g/dL — ABNORMAL LOW (ref 12.0–15.0)
MCH: 26.5 pg (ref 26.0–34.0)
MCHC: 30.8 g/dL (ref 30.0–36.0)
MCV: 86 fL (ref 78.0–100.0)
Platelets: 315 10*3/uL (ref 150–400)
RBC: 3.51 MIL/uL — ABNORMAL LOW (ref 3.87–5.11)
RDW: 15.7 % — ABNORMAL HIGH (ref 11.5–15.5)
WBC: 19.4 10*3/uL — ABNORMAL HIGH (ref 4.0–10.5)

## 2017-09-21 LAB — PHOSPHORUS: Phosphorus: 2.9 mg/dL (ref 2.5–4.6)

## 2017-09-21 LAB — BASIC METABOLIC PANEL
Anion gap: 10 (ref 5–15)
BUN: 12 mg/dL (ref 6–20)
CO2: 21 mmol/L — ABNORMAL LOW (ref 22–32)
Calcium: 8.6 mg/dL — ABNORMAL LOW (ref 8.9–10.3)
Chloride: 106 mmol/L (ref 101–111)
Creatinine, Ser: 0.74 mg/dL (ref 0.44–1.00)
GFR calc Af Amer: >60 mL/min (ref >60)
GFR calc non Af Amer: >60 mL/min (ref >60)
Glucose, Bld: 247 mg/dL — ABNORMAL HIGH (ref 65–99)
Potassium: 4 mmol/L (ref 3.5–5.1)
Sodium: 137 mmol/L (ref 135–145)

## 2017-09-21 LAB — MAGNESIUM: Magnesium: 1.8 mg/dL (ref 1.7–2.4)

## 2017-09-21 LAB — MRSA PCR SCREENING: MRSA by PCR: POSITIVE — AB

## 2017-09-21 MED ORDER — MUPIROCIN 2 % EX OINT
1.0000 "application " | TOPICAL_OINTMENT | Freq: Two times a day (BID) | CUTANEOUS | Status: AC
  Administered 2017-09-21 – 2017-09-25 (×10): 1 via NASAL
  Filled 2017-09-21 (×2): qty 22

## 2017-09-21 MED ORDER — FENTANYL 2500MCG IN NS 250ML (10MCG/ML) PREMIX INFUSION
0.0000 ug/h | INTRAVENOUS | Status: DC
  Administered 2017-09-22: 250 ug/h via INTRAVENOUS
  Administered 2017-09-22: 100 ug/h via INTRAVENOUS
  Administered 2017-09-22: 300 ug/h via INTRAVENOUS
  Administered 2017-09-23: 100 ug/h via INTRAVENOUS
  Filled 2017-09-21 (×4): qty 250

## 2017-09-21 MED ORDER — CHLORHEXIDINE GLUCONATE CLOTH 2 % EX PADS
6.0000 | MEDICATED_PAD | Freq: Every day | CUTANEOUS | Status: AC
  Administered 2017-09-21 – 2017-09-24 (×4): 6 via TOPICAL

## 2017-09-21 MED ORDER — FUROSEMIDE 40 MG PO TABS: 40.0000 mg | ORAL_TABLET | Freq: Two times a day (BID) | ORAL | 1 refills | Status: DC

## 2017-09-21 MED ORDER — INSULIN GLARGINE 100 UNIT/ML ~~LOC~~ SOLN
7.0000 [IU] | Freq: Two times a day (BID) | SUBCUTANEOUS | Status: DC
  Administered 2017-09-21 – 2017-09-23 (×5): 7 [IU] via SUBCUTANEOUS
  Filled 2017-09-21 (×5): qty 0.07

## 2017-09-21 NOTE — Progress Notes (Signed)
Inpatient Diabetes Program Recommendations  AACE/ADA: New Consensus Statement on Inpatient Glycemic Control (2015)  Target Ranges:  Prepandial:   less than 140 mg/dL      Peak postprandial:   less than 180 mg/dL (1-2 hours)      Critically ill patients:  140 - 180 mg/dL   Lab Results  Component Value Date   GLUCAP 247 (H) 09/21/2017   HGBA1C 8.7 (H) 09/20/2017    Review of Glycemic Control Results for [name], [name] (MRN [id number]) as of 09/21/2017 11:53  Ref. Range 08/15/2017 14:19 08/15/2017 16:59 09/20/2017 21:05 09/20/2017 23:16 09/21/2017 03:15  Glucose-Capillary Latest Ref Range: 65 - 99 mg/dL 373 (H) 315 (H) 267 (H) 272 (H) 247 (H)   Diabetes history: DM 2 Outpatient Diabetes medications:  Toujeo 40 units q HS, Novolog Flexpen 25-30 units tid with meals, Metformin 1000 mg bid, Prednisone 40 mg daily Current orders for Inpatient glycemic control:  Novolog resistant q 4 hours, Decadron 4 mg q 6 hours  Inpatient Diabetes Program Recommendations:    Note that patient was on insulin prior to admit.  Please consider restarting a portion of patients home dose of Lantus.  Consider Lantus 20 units daily.   If blood sugars continue greater than 200 mg/dL, consider ICU glycemic control order set/IV insulin. Will follow.    Thanks, Adah Perl, RN, BC-ADM Inpatient Diabetes Coordinator Pager [phone] (8a-5p)

## 2017-09-21 NOTE — Progress Notes (Addendum)
ENT Consult Progress Note  Subjective:  Did well overnight, no acute events. Remains intubated and only partially sedated. She is awake and mouthing that she is anxious.   WBC improved slightly. Afebrile.   Objective: Vitals:   09/21/17 0800 09/21/17 0901  BP: (!) 142/68 140/66  Pulse: 77 73  Resp: 17 18  Temp:    SpO2: 98% 99%    Physical Exam: CONSTITUTIONAL: obese, intubated, awake and alert. Moving around in bed some.  CARDIOVASCULAR: normal rate and regular rhythm PULMONARY/CHEST WALL: effort normal and no stridor, no stertor, no dysphonia Vent Mode: PSV;CPAP FiO2 (%):  [40 %-100 %] 40 % Set Rate:  [16 bmp] 16 bmp Vt Set:  [470 mL] 470 mL PEEP:  [5 cmH20] 5 cmH20 Pressure Support:  [5 cmH20] 5 cmH20 Plateau Pressure:  [16 cmH20-17 cmH20] 17 cmH20 Nasotracheal 6.5 ETT secured to right side of septum with suture, tape, tube holder. No obvious pressure on right nasal ala.  HENT: Head : normocephalic and atraumatic Ears: Right ear:   canal normal, external ear normal  Left ear:   canal normal, external ear normal  Nose: nose normal and no purulence Mouth/Throat:  Mouth: uvula midline and no oral lesions. Edentulous Throat: oropharynx clear and moist Mucous membranes: normal EYES: conjunctiva normal, EOM normal and PERRL NECK: supple, trachea normal and no thyromegaly or cervical LAD     Data Review/Consults/Discussions: Reviewed labs- WBC now 19. Cr normal.  Yesterday, pH 7.2 CXR personally reviewed- ETT above carina, in good position. Small L pleural effusion.  Critical Care notes reviewed, appreciate their assistance with management  Assessment: [name] 56 y.o. female with PMH of DMII, sarcoidosis, CHF, HTN, GERD, obesity who presents with supraglottitis with airway compromise requiring emergent fiberoptic intubation in the operating room. Patient remains nasotracheally intubated with tube secured via 0 silk suture to right side of septum. Patient should  remain intubated and sedated enough to be unable to pull ETT or move its position in any way for at least another 48 hours.    Plan:  -Patient to remain intubated for at least 48-72 hours. Please maintain restraints, sedation appropriately.  -Decadron 10mg  IV x 3 doses is now complete. Patient may resume home steroid regimen per Critical Care.  -Please check for cuff leak the evening of 12/13 and morning of 12/14. If cuff leak is present, she may be considered for trial extubation in the operating room. Will plan to wean sedation and take to the operating room awake on Friday.  -IV antibiotics- IV rocephin and IV vancomycin. Please monitor vanc trough levels carefully, given patient's history of AKI after vancomycin previously.    Thank you for involving [facility] in the care of this patient. Should you need further assistance, please call our office at [phone].    Gavin Pound, MD

## 2017-09-21 NOTE — Progress Notes (Signed)
CRITICAL VALUE ALERT  Critical Value:  MRSA PCR Positive (+)  Date & Time Notied:  09/21/2017 00:05  Provider Notified:  Junius Roads. PA  Orders Received/Actions taken: Standing MRSA PCR positive order set implemented.

## 2017-09-21 NOTE — Progress Notes (Signed)
PULMONARY / CRITICAL CARE MEDICINE   Name: [name] MRN: [id number] DOB: 1961/03/13    ADMISSION DATE:  09/20/2017 CONSULTATION DATE:  09/20/17  REFERRING MD:  Blenda Nicely  CHIEF COMPLAINT:  Sore Throat  HISTORY OF PRESENT ILLNESS:  [name] encephelopathic; therefore, this HPI is obtained from chart review. [name] is a 56 y.o. female with PMH as outlined below. She presented to [facility] ED 12/11 with odynophagia and dysphagia x 2 days.  She was seen at urgent care the day prior and given Keflex; however, symptoms continued to progress. She later began to lose voice and on day of presentation had no voice at all.  She came to ED due to worsening symptoms including malaise, fatigue, stridor.  In ED, she was seen by ENT and had transnasal flexible laryngoscopy that demonstrated an erythematous and edematous epiglottis with small blister, very edematous arytenoids and false cords.  Findings c/w supraglottitis. She was subsequently taken to OR where she had an awake fiberoptic nasal intubation for airway protection.  She was started on vanc and rocephin as well as IV steroids.  PCCM asked to assist with vent management.    SUBJECTIVE:  On vent.  Follows commands weakly  VITAL SIGNS: BP 140/66   Pulse 73   Temp 99.7 F (37.6 C) (Oral)   Resp 18   Wt 251 lb 1.7 oz (113.9 kg)   SpO2 99%   BMI 40.53 kg/m   HEMODYNAMICS:    VENTILATOR SETTINGS: Vent Mode: PSV;CPAP FiO2 (%):  [40 %-100 %] 40 % Set Rate:  [16 bmp] 16 bmp Vt Set:  [470 mL] 470 mL PEEP:  [5 cmH20] 5 cmH20 Pressure Support:  [5 cmH20] 5 cmH20 Plateau Pressure:  [16 cmH20-17 cmH20] 17 cmH20  INTAKE / OUTPUT: I/O last 3 completed shifts: In: 3398.1 [I.V.:1498.1; IV Piggyback:1900] Out: 16 [Urine:795]   PHYSICAL EXAMINATION: General: Morbidly obese female whose nasotracheally intubated sedated but follows commands HEENT: Endotracheal intubation connected to ventilator PSY: Sedated Neuro: Follows  commands moves all extremities x4 CV: Heart sounds are regular rate rate and rhythm PULM: Decreased breath sounds in the bases ST:MHDQ, non-tender, bsx4 active  Extremities: warm/dry, 1+ edema  Skin: no rashes or lesions   LABS:  BMET Recent Labs  Lab 09/20/17 1431 09/21/17 0316  NA 140 137  K 3.4* 4.0  CL 104 106  CO2  --  21*  BUN 8 12  CREATININE 0.70 0.74  GLUCOSE 237* 247*    Electrolytes Recent Labs  Lab 09/21/17 0316  CALCIUM 8.6*  MG 1.8  PHOS 2.9    CBC Recent Labs  Lab 09/20/17 1418 09/20/17 1431 09/21/17 0316  WBC 21.5*  --  19.4*  HGB 10.4* 10.5* 9.3*  HCT 33.0* 31.0* 30.2*  PLT 359  --  315    Coag's No results for input(s): APTT, INR in the last 168 hours.  Sepsis Markers No results for input(s): LATICACIDVEN, PROCALCITON, O2SATVEN in the last 168 hours.  ABG Recent Labs  Lab 09/20/17 2042  PHART 7.295*  PCO2ART 50.4*  PO2ART 303*    Liver Enzymes No results for input(s): AST, ALT, ALKPHOS, BILITOT, ALBUMIN in the last 168 hours.  Cardiac Enzymes No results for input(s): TROPONINI, PROBNP in the last 168 hours.  Glucose Recent Labs  Lab 09/20/17 2105 09/20/17 2316 09/21/17 0315  GLUCAP 267* 272* 247*    Imaging Dg Chest 2 View  Result Date: 09/20/2017 CLINICAL DATA:  Pain and dysphagia EXAM: CHEST  2  VIEW COMPARISON:  August 12, 2017. FINDINGS: There is no edema or consolidation. Heart is slightly enlarged with pulmonary vascularity within normal limits. No adenopathy. No bone lesions. No pneumothorax or pneumomediastinum. IMPRESSION: Heart mildly enlarged.  No edema or consolidation. Electronically Signed   By: Lowella Grip III M.D.   On: 09/20/2017 15:10   Ct Soft Tissue Neck W Contrast  Result Date: 09/20/2017 CLINICAL DATA:  Sore throat with difficulty swallowing for 2 days. Leukocytosis. EXAM: CT NECK WITH CONTRAST TECHNIQUE: Multidetector CT imaging of the neck was performed using the standard protocol  following the bolus administration of intravenous contrast. CONTRAST:  34mL ISOVUE-300 IOPAMIDOL (ISOVUE-300) INJECTION 61% COMPARISON:  11/30/2015 FINDINGS: Pharynx and larynx: There is diffuse soft tissue thickening involving the posterior oropharyngeal wall inferiorly, with more extensive soft tissue thickening throughout the hypopharynx. The epiglottis is thickened, as are the aryepiglottic folds (greater on the left). No focal mass is identified. There is mild retropharyngeal edema and mild parapharyngeal fat stranding. No abscess is identified. The supraglottic airway is mildly narrowed. Salivary glands: No inflammation, mass, or stone. Thyroid: Unremarkable. Lymph nodes: No enlarged or suspicious lymph nodes in the neck. Vascular: The major vascular structures of the neck appear patent. Limited intracranial: Unremarkable. Visualized orbits: Bilateral cataract extraction. Mastoids and visualized paranasal sinuses: Clear. Skeleton: No acute osseous abnormality or suspicious osseous lesion. Upper chest: Clear lung apices. Other: None. IMPRESSION: Diffuse inferior pharyngeal soft tissue thickening with involvement of the epiglottis and supraglottic larynx, likely infectious. Correlate with direct visualization and clinical follow-up to exclude neoplasm. No abscess. Electronically Signed   By: Logan Bores M.D.   On: 09/20/2017 15:19   Portable Chest Xray  Result Date: 09/21/2017 CLINICAL DATA:  Hypoxia EXAM: PORTABLE CHEST 1 VIEW COMPARISON:  September 20, 2017 FINDINGS: Endotracheal tube tip is 4.4 cm above the carina. No pneumothorax. There is bibasilar atelectatic change with small left pleural effusion. There is stable cardiomegaly with pulmonary vascularity within normal limits. No adenopathy. No bone lesions. IMPRESSION: Endotracheal tube as described without pneumothorax. Bibasilar atelectasis with small left pleural effusion. Lungs elsewhere clear. Stable cardiac prominence. Electronically Signed    By: Lowella Grip III M.D.   On: 09/21/2017 07:05   Dg Chest Port 1 View  Result Date: 09/20/2017 CLINICAL DATA:  Endotracheal tube placement. EXAM: PORTABLE CHEST 1 VIEW COMPARISON:  Radiographs of same day. FINDINGS: Stable cardiomegaly. Endotracheal tube is seen projected over tracheal air shadow with distal tip 6 cm above the carina. No pneumothorax or pleural effusion is noted. No acute pulmonary disease is noted. Bony thorax is unremarkable. IMPRESSION: Endotracheal tube is in grossly good position. No acute cardiopulmonary abnormality seen. Electronically Signed   By: Marijo Conception, M.D.   On: 09/20/2017 21:22     STUDIES:  CXR 12/11 > No acute process. CT soft tissue neck 12/11 > soft tissue thickening with involvement of the epiglottis and supraglottic larynx.  CULTURES: Strep 12/11 >  Sputum 12/11 >   ANTIBIOTICS: Vanc 12/11 >  Ceftriaxone 12/11 >   SIGNIFICANT EVENTS: 12/11 > admit.  LINES/TUBES: Nasal ETT 12/11 >   DISCUSSION: 56 y.o. female admitted 12/11 with supraglottitis.  She required awake fiberoptic nasal  intubation in OR for airway protection.  Started on abx and steroids and PCCM asked to assist with vent management.  ASSESSMENT / PLAN:  PULMONARY A: Respiratory insufficiency - s/p awake fiberoptic nasal intubation in OR due to concern for loss of airway in setting of supraglottitis. Hx pulmonary  sarcoidosis (diagnosed by mediastinoscopy in 2008, followed by Dr. Vaughan Browner - previously Dr. Joya Gaskins, as well as Dr. Casper Harrison at [facility]), COPD, VCD. P:   Full vent support. Wean as able. VAP prevention measures. Daily SBT but no extubation for at least 2 - 3 days. Continue decadron, BD's. Once off decadron, then continue prednisone daily (chronically on pred for sarcoid). CXR daily Most likely need to be extubated in the OR with anesthesia standby  CARDIOVASCULAR A:  Hx HTN, HLD, dCHF (Echo from Nov 2015 with EF 55-60%, G1DD). P:  Monitor  hemodynamics. Hold preadmission ASA, atorvastatin, fenofibrate, carvedilol, diltiazem, furosemide.  RENAL Lab Results  Component Value Date   CREATININE 0.74 09/21/2017   CREATININE 0.70 09/20/2017   CREATININE 0.86 08/15/2017   CREATININE 1.24 (H) 06/16/2017   CREATININE 1.11 (H) 01/26/2017   CREATININE 1.16 (H) 10/14/2016   CREATININE 0.8 04/17/2014   Recent Labs  Lab 09/20/17 1431 09/21/17 0316  K 3.4* 4.0    A:   Hypokalemia. P:   Replete Follow electrolytes  GASTROINTESTINAL A:   Nutrition. Hx GERD, obesity. P:   NPO. SUP: Pantoprazole. Will hold off on placing NGT for now given nasal intubation.  HEMATOLOGIC Recent Labs    09/20/17 1431 09/21/17 0316  HGB 10.5* 9.3*    A:   Anemia - chronic. VTE Prophylaxis. P:  Transfuse for Hgb < 7. SCD's / heparin. CBC in AM.  INFECTIOUS A:   Supraglottitis. P:   Abx as above (vanc / ceftriaxone).  Follow cultures as above.  ENDOCRINE CBG (last 3)  Recent Labs    09/20/17 2105 09/20/17 2316 09/21/17 0315  GLUCAP 267* 272* 247*    A:   Hx DM II. P:   SSI. Hold preadmission metformin, insulin.  NEUROLOGIC A:   Sedation due to mechanical ventilation. P:   Sedation:  Propofol gtt / fentanyl PRN. RASS goal: 0 to -1. Daily WUA. Hold preadmission gabapentin, norco, lorazepam, roxicet, paroxetine mesylate, venlafaxine.  Family updated: None available.  Interdisciplinary Family Meeting v Palliative Care Meeting:  Due by: 09/26/17.  CC time: 30 min.  Richardson Landry Minor ACNP Maryanna Shape PCCM Pager [phone] till 1 pm If no answer page [phone]  Attending Note:  56 year old female with acute epiglottitis that was a difficult airway and was intubated nasally.  Patient was left intubated and ENT is primary.  On exam, lungs are clear.  I reviewed CXR myself, ETT is in good position.  Will maintain sedated and intubated.  Replace electrolytes.  Hold off weaning.  Adjust vent for ABG.  Hold off TF for  now.  Plan to to take patient to the OR on Friday and extubate and inspect there.  PCCM will continue to follow.  The patient is critically ill with multiple organ systems failure and requires high complexity decision making for assessment and support, frequent evaluation and titration of therapies, application of advanced monitoring technologies and extensive interpretation of multiple databases.   Critical Care Time devoted to patient care services described in this note is  35  Minutes. This time reflects time of care of this signee Dr Jennet Maduro. This critical care time does not reflect procedure time, or teaching time or supervisory time of PA/NP/Med student/Med Resident etc but could involve care discussion time.  Rush Farmer, M.D. [facility] Pulmonary/Critical Care Medicine. Pager: [phone]. After hours pager: [phone].   09/21/2017, 9:08 AM

## 2017-09-22 ENCOUNTER — Ambulatory Visit: Payer: BLUE CROSS/BLUE SHIELD | Admitting: Family Medicine

## 2017-09-22 ENCOUNTER — Inpatient Hospital Stay (HOSPITAL_COMMUNITY): Payer: BLUE CROSS/BLUE SHIELD

## 2017-09-22 DIAGNOSIS — E876 Hypokalemia: Secondary | ICD-10-CM

## 2017-09-22 LAB — MAGNESIUM: Magnesium: 1.9 mg/dL (ref 1.7–2.4)

## 2017-09-22 LAB — GLUCOSE, CAPILLARY
Glucose-Capillary: 237 mg/dL — ABNORMAL HIGH (ref 65–99)
Glucose-Capillary: 203 mg/dL — ABNORMAL HIGH (ref 65–99)
Glucose-Capillary: 178 mg/dL — ABNORMAL HIGH (ref 65–99)
Glucose-Capillary: 227 mg/dL — ABNORMAL HIGH (ref 65–99)
Glucose-Capillary: 214 mg/dL — ABNORMAL HIGH (ref 65–99)
Glucose-Capillary: 194 mg/dL — ABNORMAL HIGH (ref 65–99)
Glucose-Capillary: 199 mg/dL — ABNORMAL HIGH (ref 65–99)

## 2017-09-22 LAB — CULTURE, GROUP A STREP (THRC)

## 2017-09-22 LAB — BASIC METABOLIC PANEL
Anion gap: 8 (ref 5–15)
BUN: 16 mg/dL (ref 6–20)
CO2: 23 mmol/L (ref 22–32)
Calcium: 8.9 mg/dL (ref 8.9–10.3)
Chloride: 109 mmol/L (ref 101–111)
Creatinine, Ser: 0.67 mg/dL (ref 0.44–1.00)
GFR calc Af Amer: >60 mL/min (ref >60)
GFR calc non Af Amer: >60 mL/min (ref >60)
Glucose, Bld: 225 mg/dL — ABNORMAL HIGH (ref 65–99)
Potassium: 3.8 mmol/L (ref 3.5–5.1)
Sodium: 140 mmol/L (ref 135–145)

## 2017-09-22 LAB — PHOSPHORUS: Phosphorus: 3.4 mg/dL (ref 2.5–4.6)

## 2017-09-22 MED ORDER — MAGNESIUM SULFATE 2 GM/50ML IV SOLN
2.0000 g | Freq: Once | INTRAVENOUS | Status: AC
  Administered 2017-09-22: 2 g via INTRAVENOUS
  Filled 2017-09-22: qty 50

## 2017-09-22 MED ORDER — POTASSIUM CHLORIDE 10 MEQ/100ML IV SOLN
INTRAVENOUS | Status: AC
  Filled 2017-09-22: qty 100

## 2017-09-22 MED ORDER — FUROSEMIDE 10 MG/ML IJ SOLN
20.0000 mg | Freq: Four times a day (QID) | INTRAMUSCULAR | Status: AC
  Administered 2017-09-22 (×3): 20 mg via INTRAVENOUS
  Filled 2017-09-22 (×3): qty 2

## 2017-09-22 MED ORDER — POTASSIUM CHLORIDE 10 MEQ/100ML IV SOLN
10.0000 meq | INTRAVENOUS | Status: AC
  Administered 2017-09-22 (×4): 10 meq via INTRAVENOUS
  Filled 2017-09-22 (×3): qty 100

## 2017-09-22 NOTE — Progress Notes (Signed)
PULMONARY / CRITICAL CARE MEDICINE   Name: [name] MRN: [id number] DOB: Feb 12, 1961    ADMISSION DATE:  09/20/2017 CONSULTATION DATE:  09/20/17  REFERRING MD:  Blenda Nicely  CHIEF COMPLAINT:  Sore Throat  HISTORY OF PRESENT ILLNESS:  [name] encephelopathic; therefore, this HPI is obtained from chart review. [name] is a 56 y.o. female with PMH as outlined below. She presented to [facility] ED 12/11 with odynophagia and dysphagia x 2 days.  She was seen at urgent care the day prior and given Keflex; however, symptoms continued to progress. She later began to lose voice and on day of presentation had no voice at all.  She came to ED due to worsening symptoms including malaise, fatigue, stridor.  In ED, she was seen by ENT and had transnasal flexible laryngoscopy that demonstrated an erythematous and edematous epiglottis with small blister, very edematous arytenoids and false cords.  Findings c/w supraglottitis. She was subsequently taken to OR where she had an awake fiberoptic nasal intubation for airway protection.  She was started on vanc and rocephin as well as IV steroids.  PCCM asked to assist with vent management.  SUBJECTIVE:  Some bleeding occurred overnight but appeared to be from the nose, no further events  VITAL SIGNS: BP (!) 122/59   Pulse 60   Temp 99 F (37.2 C) (Axillary)   Resp 16   Wt 115.3 kg (254 lb 3.1 oz)   SpO2 99%   BMI 41.03 kg/m   HEMODYNAMICS:    VENTILATOR SETTINGS: Vent Mode: PRVC FiO2 (%):  [40 %] 40 % Set Rate:  [16 bmp] 16 bmp Vt Set:  [470 mL] 470 mL PEEP:  [5 cmH20] 5 cmH20 Plateau Pressure:  [13 cmH20-27 cmH20] 27 cmH20  INTAKE / OUTPUT: I/O last 3 completed shifts: In: 5798.4 [I.V.:4698.4; IV Piggyback:1100] Out: 2045 [Urine:2045]  PHYSICAL EXAMINATION: General: Morbidly obese female, arousable and following commands HEENT: Nasotracheal intubation, [place]/AT, PERRL, EOM-I and MMM PSY: Appropriate  Neuro: Sedated but arousable  and following some commands CV: RRR, Nl S1/S2 and -M/R/G. PULM: CTA bilaterally GI: Soft, NT, ND and +BS Extremities: 1+ edema and -tenderness Skin: no rashes or lesions  LABS:  BMET Recent Labs  Lab 09/20/17 1431 09/21/17 0316 09/22/17 0253  NA 140 137 140  K 3.4* 4.0 3.8  CL 104 106 109  CO2  --  21* 23  BUN 8 12 16   CREATININE 0.70 0.74 0.67  GLUCOSE 237* 247* 225*   Electrolytes Recent Labs  Lab 09/21/17 0316 09/22/17 0253  CALCIUM 8.6* 8.9  MG 1.8 1.9  PHOS 2.9 3.4   CBC Recent Labs  Lab 09/20/17 1418 09/20/17 1431 09/21/17 0316  WBC 21.5*  --  19.4*  HGB 10.4* 10.5* 9.3*  HCT 33.0* 31.0* 30.2*  PLT 359  --  315   Coag's No results for input(s): APTT, INR in the last 168 hours.  Sepsis Markers No results for input(s): LATICACIDVEN, PROCALCITON, O2SATVEN in the last 168 hours.  ABG Recent Labs  Lab 09/20/17 2042  PHART 7.295*  PCO2ART 50.4*  PO2ART 303*   Liver Enzymes No results for input(s): AST, ALT, ALKPHOS, BILITOT, ALBUMIN in the last 168 hours.  Cardiac Enzymes No results for input(s): TROPONINI, PROBNP in the last 168 hours.  Glucose Recent Labs  Lab 09/21/17 1102 09/21/17 1637 09/21/17 1921 09/21/17 2358 09/22/17 0345 09/22/17 0758  GLUCAP 259* 221* 237* 240* 203* 178*   Imaging Dg Chest Port 1 View  Result Date:  09/22/2017 CLINICAL DATA:  Respiratory failure. EXAM: PORTABLE CHEST 1 VIEW COMPARISON:  09/21/2017. FINDINGS: Endotracheal tube in stable position. Stable cardiomegaly. Improvement of bibasilar atelectasis. Interim improvement of small left pleural effusion. No pneumothorax. IMPRESSION: 1. Endotracheal tube in stable position. 2. Stable cardiomegaly. 3. Interim improvement of bibasilar atelectasis. Improvement of small left pleural effusion. Electronically Signed   By: Marcello Moores  Register   On: 09/22/2017 06:18   STUDIES:  CXR 12/11 > No acute process. CT soft tissue neck 12/11 > soft tissue thickening with  involvement of the epiglottis and supraglottic larynx.  CULTURES: Strep 12/11 >  Sputum 12/11 >   ANTIBIOTICS: Vanc 12/11 >  Ceftriaxone 12/11 >   SIGNIFICANT EVENTS: 12/11 > admit.  LINES/TUBES: Nasal ETT 12/11 >   DISCUSSION: 56 y.o. female admitted 12/11 with supraglottitis.  She required awake fiberoptic nasal  intubation in OR for airway protection.  Started on abx and steroids and PCCM asked to assist with vent management.  ASSESSMENT / PLAN:  PULMONARY A: Respiratory insufficiency - s/p awake fiberoptic nasal intubation in OR due to concern for loss of airway in setting of supraglottitis. Hx pulmonary sarcoidosis (diagnosed by mediastinoscopy in 2008, followed by Dr. Vaughan Browner - previously Dr. Joya Gaskins, as well as Dr. Casper Harrison at [facility]), COPD, VCD. P:   Full vent support until visit to the OR in AM Hold weaning VAP prevention measures. Daily SBT but no extubation until AM in the OR Continue decadron and BD's. Once off decadron, then continue prednisone daily (chronically on pred for sarcoid). CXR daily Budesonide and brovana as ordered  CARDIOVASCULAR A:  Hx HTN, HLD, dCHF (Echo from Nov 2015 with EF 55-60%, G1DD). P:  Monitor hemodynamics. Hold preadmission ASA, atorvastatin, fenofibrate, carvedilol, diltiazem, furosemide.  RENAL Lab Results  Component Value Date   CREATININE 0.67 09/22/2017   CREATININE 0.74 09/21/2017   CREATININE 0.70 09/20/2017   CREATININE 1.24 (H) 06/16/2017   CREATININE 1.11 (H) 01/26/2017   CREATININE 1.16 (H) 10/14/2016   CREATININE 0.8 04/17/2014   Recent Labs  Lab 09/20/17 1431 09/21/17 0316 09/22/17 0253  K 3.4* 4.0 3.8  A:   Hypokalemia. Fluid overload P:   Replete K IV, no PO access and no central line Follow electrolytes KVO IVF (positive 2L in one day) Low dose lasix  GASTROINTESTINAL A:   Nutrition. Hx GERD, obesity. P:   NPO. SUP: Pantoprazole. Will hold off on placing NGT for now given nasal  intubation.  HEMATOLOGIC Recent Labs    09/20/17 1431 09/21/17 0316  HGB 10.5* 9.3*  A:   Anemia - chronic. VTE Prophylaxis. P:  Transfuse for Hgb < 7. SCD's / heparin. CBC in AM.  INFECTIOUS A:   Supraglottitis. P:   Abx as above (vanc / ceftriaxone).  Follow cultures as above.  ENDOCRINE CBG (last 3)  Recent Labs    09/21/17 2358 09/22/17 0345 09/22/17 0758  GLUCAP 240* 203* 178*  A:   Hx DM II. P:   SSI. Hold preadmission metformin, insulin.  NEUROLOGIC A:   Sedation due to mechanical ventilation. P:   Sedation:  Propofol gtt / fentanyl PRN. RASS goal: 0 to -1. Daily WUA. Hold preadmission gabapentin, norco, lorazepam, roxicet, paroxetine mesylate, venlafaxine.  Family updated: None available.  Interdisciplinary Family Meeting v Palliative Care Meeting:  Due by: 09/26/17.  The patient is critically ill with multiple organ systems failure and requires high complexity decision making for assessment and support, frequent evaluation and titration of therapies, application of advanced monitoring technologies  and extensive interpretation of multiple databases.   Critical Care Time devoted to patient care services described in this note is  35  Minutes. This time reflects time of care of this signee Dr Jennet Maduro. This critical care time does not reflect procedure time, or teaching time or supervisory time of PA/NP/Med student/Med Resident etc but could involve care discussion time.  Rush Farmer, M.D. [facility] Pulmonary/Critical Care Medicine. Pager: [phone]. After hours pager: [phone].   09/22/2017, 9:11 AM

## 2017-09-22 NOTE — Progress Notes (Signed)
Inpatient Diabetes Program Recommendations  AACE/ADA: New Consensus Statement on Inpatient Glycemic Control (2015)  Target Ranges:  Prepandial:   less than 140 mg/dL      Peak postprandial:   less than 180 mg/dL (1-2 hours)      Critically ill patients:  140 - 180 mg/dL   Lab Results  Component Value Date   GLUCAP 240 (H) 09/21/2017   HGBA1C 8.7 (H) 09/20/2017    Review of Glycemic ControlResults for MAIE, KESINGER (MRN [id number]) as of 09/22/2017 07:46  Ref. Range 09/21/2017 16:37 09/21/2017 19:21 09/21/2017 23:58  Glucose-Capillary Latest Ref Range: 65 - 99 mg/dL 221 (H) 237 (H) 240 (H)    Diabetes history: Type 2 DM Outpatient Diabetes medications:  Toujeo 40 units q HS, Novolog Flexpen 25-30 units tid with meals, Metformin 1000 mg bid, Prednisone 40 mg daily Current orders for Inpatient glycemic control:  Novolog resistant q 4 hours, Decadron 4 mg q 6 hours, Lantus 7 units bid  Inpatient Diabetes Program Recommendations:    Please consider increasing Lantus to 15 units bid or start IV insulin/ICU glycemic control order set. Will follow.  Thanks, Adah Perl, RN, BC-ADM Inpatient Diabetes Coordinator Pager [phone] (8a-5p)

## 2017-09-23 ENCOUNTER — Inpatient Hospital Stay (HOSPITAL_COMMUNITY): Payer: BLUE CROSS/BLUE SHIELD | Admitting: Certified Registered"

## 2017-09-23 ENCOUNTER — Inpatient Hospital Stay (HOSPITAL_COMMUNITY): Payer: BLUE CROSS/BLUE SHIELD

## 2017-09-23 ENCOUNTER — Encounter (HOSPITAL_COMMUNITY)
Admission: EM | Disposition: A | Discharge status: Home / Self Care | Payer: Self-pay | Source: Home / Self Care | Attending: Otolaryngology

## 2017-09-23 HISTORY — PX: EXTUBATION (ENDOTRACHEAL) IN OR: SHX6588

## 2017-09-23 HISTORY — PX: FIBEROPTIC LARYNGOSCOPY AND TRACHEOSCOPY: SHX6414

## 2017-09-23 LAB — BLOOD GAS, ARTERIAL
Acid-Base Excess: 2 mmol/L (ref 0.0–2.0)
Bicarbonate: 25.4 mmol/L (ref 20.0–28.0)
Drawn by: 41977
FIO2: 0.4
MECHVT: 470 mL
O2 Saturation: 99.6 %
PEEP: 5 cmH2O
Patient temperature: 98.6
RATE: 16 resp/min
pCO2 arterial: 35.4 mmHg (ref 32.0–48.0)
pH, Arterial: 7.469 — ABNORMAL HIGH (ref 7.350–7.450)
pO2, Arterial: 198 mmHg — ABNORMAL HIGH (ref 83.0–108.0)

## 2017-09-23 LAB — MAGNESIUM: Magnesium: 1.7 mg/dL (ref 1.7–2.4)

## 2017-09-23 LAB — BASIC METABOLIC PANEL
Anion gap: 9 (ref 5–15)
BUN: 18 mg/dL (ref 6–20)
CO2: 25 mmol/L (ref 22–32)
Calcium: 9 mg/dL (ref 8.9–10.3)
Chloride: 106 mmol/L (ref 101–111)
Creatinine, Ser: 0.63 mg/dL (ref 0.44–1.00)
GFR calc Af Amer: >60 mL/min (ref >60)
GFR calc non Af Amer: >60 mL/min (ref >60)
Glucose, Bld: 220 mg/dL — ABNORMAL HIGH (ref 65–99)
Potassium: 3.7 mmol/L (ref 3.5–5.1)
Sodium: 140 mmol/L (ref 135–145)

## 2017-09-23 LAB — CBC
HCT: 29.6 % — ABNORMAL LOW (ref 36.0–46.0)
Hemoglobin: 9.2 g/dL — ABNORMAL LOW (ref 12.0–15.0)
MCH: 26.4 pg (ref 26.0–34.0)
MCHC: 31.1 g/dL (ref 30.0–36.0)
MCV: 84.8 fL (ref 78.0–100.0)
Platelets: 338 10*3/uL (ref 150–400)
RBC: 3.49 MIL/uL — ABNORMAL LOW (ref 3.87–5.11)
RDW: 15.5 % (ref 11.5–15.5)
WBC: 12.8 10*3/uL — ABNORMAL HIGH (ref 4.0–10.5)

## 2017-09-23 LAB — GLUCOSE, CAPILLARY
Glucose-Capillary: 217 mg/dL — ABNORMAL HIGH (ref 65–99)
Glucose-Capillary: 199 mg/dL — ABNORMAL HIGH (ref 65–99)
Glucose-Capillary: 196 mg/dL — ABNORMAL HIGH (ref 65–99)
Glucose-Capillary: 166 mg/dL — ABNORMAL HIGH (ref 65–99)
Glucose-Capillary: 138 mg/dL — ABNORMAL HIGH (ref 65–99)
Glucose-Capillary: 149 mg/dL — ABNORMAL HIGH (ref 65–99)

## 2017-09-23 LAB — VANCOMYCIN, TROUGH: Vancomycin Tr: 14 ug/mL — ABNORMAL LOW (ref 15–20)

## 2017-09-23 LAB — PHOSPHORUS: Phosphorus: 3.2 mg/dL (ref 2.5–4.6)

## 2017-09-23 SURGERY — FIBEROPTIC LARYNGOSCOPY AND TRACHEOSCOPY
Anesthesia: Monitor Anesthesia Care | Site: Throat

## 2017-09-23 MED ORDER — LORAZEPAM 2 MG/ML IJ SOLN
0.2500 mg | INTRAMUSCULAR | Status: DC | PRN
  Administered 2017-09-23 – 2017-09-25 (×3): 0.25 mg via INTRAVENOUS
  Filled 2017-09-23 (×3): qty 1

## 2017-09-23 MED ORDER — METHYLPREDNISOLONE SODIUM SUCC 40 MG IJ SOLR
10.0000 mg | Freq: Every day | INTRAMUSCULAR | Status: DC
  Administered 2017-09-23 – 2017-09-26 (×4): 10 mg via INTRAVENOUS
  Filled 2017-09-23 (×2): qty 1
  Filled 2017-09-23 (×2): qty 0.25

## 2017-09-23 MED ORDER — OXYMETAZOLINE HCL 0.05 % NA SOLN
NASAL | Status: DC | PRN
  Administered 2017-09-23: 2 via NASAL

## 2017-09-23 MED ORDER — LORAZEPAM BOLUS VIA INFUSION: 0.2500 mg | INTRAVENOUS | Status: DC | PRN

## 2017-09-23 MED ORDER — MIDAZOLAM HCL 2 MG/2ML IJ SOLN
INTRAMUSCULAR | Status: AC
  Filled 2017-09-23: qty 2

## 2017-09-23 MED ORDER — PROPOFOL 10 MG/ML IV BOLUS
INTRAVENOUS | Status: AC
  Filled 2017-09-23: qty 20

## 2017-09-23 MED ORDER — 0.9 % SODIUM CHLORIDE (POUR BTL) OPTIME
TOPICAL | Status: DC | PRN
  Administered 2017-09-23: 1000 mL

## 2017-09-23 MED ORDER — LACTATED RINGERS IV SOLN
INTRAVENOUS | Status: DC | PRN
  Administered 2017-09-23: 12:00:00 via INTRAVENOUS

## 2017-09-23 MED ORDER — INSULIN GLARGINE 100 UNIT/ML ~~LOC~~ SOLN
10.0000 [IU] | Freq: Two times a day (BID) | SUBCUTANEOUS | Status: DC
  Administered 2017-09-23 – 2017-09-26 (×6): 10 [IU] via SUBCUTANEOUS
  Filled 2017-09-23 (×7): qty 0.1

## 2017-09-23 MED ORDER — VANCOMYCIN HCL IN DEXTROSE 1-5 GM/200ML-% IV SOLN
1000.0000 mg | Freq: Two times a day (BID) | INTRAVENOUS | Status: DC
  Administered 2017-09-23 – 2017-09-26 (×7): 1000 mg via INTRAVENOUS
  Filled 2017-09-23 (×8): qty 200

## 2017-09-23 MED ORDER — LIDOCAINE 2% (20 MG/ML) 5 ML SYRINGE
INTRAMUSCULAR | Status: DC | PRN
  Administered 2017-09-23: 60 mg via INTRAVENOUS

## 2017-09-23 MED ORDER — FENTANYL CITRATE (PF) 250 MCG/5ML IJ SOLN
INTRAMUSCULAR | Status: AC
  Filled 2017-09-23: qty 5

## 2017-09-23 MED ORDER — ORAL CARE MOUTH RINSE
15.0000 mL | Freq: Two times a day (BID) | OROMUCOSAL | Status: DC
  Administered 2017-09-24 – 2017-09-25 (×4): 15 mL via OROMUCOSAL

## 2017-09-23 MED ORDER — CHLORHEXIDINE GLUCONATE 0.12 % MT SOLN
15.0000 mL | Freq: Two times a day (BID) | OROMUCOSAL | Status: DC
Start: 2017-09-23 — End: 2017-09-26
  Administered 2017-09-23 – 2017-09-25 (×5): 15 mL via OROMUCOSAL
  Filled 2017-09-23 (×2): qty 15

## 2017-09-23 SURGICAL SUPPLY — 20 items
BALLN PULM 15 16.5 18X75 (BALLOONS)
BALLOON PULM 15 16.5 18X75 (BALLOONS) IMPLANT
CANISTER SUCT 3000ML PPV (MISCELLANEOUS) IMPLANT
CONT SPEC 4OZ CLIKSEAL STRL BL (MISCELLANEOUS) IMPLANT
COVER BACK TABLE 60X90IN (DRAPES) ×3 IMPLANT
COVER MAYO STAND STRL (DRAPES) ×3 IMPLANT
DRAPE HALF SHEET 40X57 (DRAPES) ×3 IMPLANT
GAUZE SPONGE 4X4 16PLY XRAY LF (GAUZE/BANDAGES/DRESSINGS) ×3 IMPLANT
GLOVE BIO SURGEON STRL SZ 6.5 (GLOVE) ×3 IMPLANT
GUARD TEETH (MISCELLANEOUS) IMPLANT
KIT BASIN OR (CUSTOM PROCEDURE TRAY) ×3 IMPLANT
KIT ROOM TURNOVER OR (KITS) ×3 IMPLANT
NS IRRIG 1000ML POUR BTL (IV SOLUTION) ×3 IMPLANT
PAD ARMBOARD 7.5X6 YLW CONV (MISCELLANEOUS) ×6 IMPLANT
PATTIES SURGICAL .5 X1 (DISPOSABLE) IMPLANT
PATTIES SURGICAL .5X1.5 (GAUZE/BANDAGES/DRESSINGS) IMPLANT
SURGILUBE 2OZ TUBE FLIPTOP (MISCELLANEOUS) IMPLANT
TOWEL NATURAL 6PK STERILE (DISPOSABLE) ×3 IMPLANT
TUBE CONNECTING 12X1/4 (SUCTIONS) ×3 IMPLANT
WATER STERILE IRR 1000ML POUR (IV SOLUTION) ×3 IMPLANT

## 2017-09-23 NOTE — Progress Notes (Signed)
09/23/2017 7:23 PM  [name] [id number]  Post-Op Check   Temp:  [98.7 F (37.1 C)-99.8 F (37.7 C)] 98.7 F (37.1 C) (12/14 1515) Pulse Rate:  [57-79] 58 (12/14 1900) Resp:  [7-30] 7 (12/14 1900) BP: (128-177)/(54-81) 147/55 (12/14 1900) SpO2:  [86 %-100 %] 100 % (12/14 1900) FiO2 (%):  [40 %] 40 % (12/14 1131),     Intake/Output Summary (Last 24 hours) at 09/23/2017 1923 Last data filed at 09/23/2017 1901 Gross per 24 hour  Intake 883.68 ml  Output 2920 ml  Net -2036.32 ml    Results for orders placed or performed during the hospital encounter of 09/20/17 (from the past 24 hour(s))  Glucose, capillary     Status: Abnormal   Collection Time: 09/22/17  7:34 PM  Result Value Ref Range   Glucose-Capillary 194 (H) 65 - 99 mg/dL   Comment 1 Capillary Specimen    Comment 2 Notify RN   Glucose, capillary     Status: Abnormal   Collection Time: 09/22/17 11:12 PM  Result Value Ref Range   Glucose-Capillary 199 (H) 65 - 99 mg/dL   Comment 1 Capillary Specimen    Comment 2 Notify RN   Blood gas, arterial     Status: Abnormal   Collection Time: 09/23/17  3:25 AM  Result Value Ref Range   FIO2 0.40    Delivery systems VENTILATOR    Mode PRESSURE REGULATED VOLUME CONTROL    VT 470 mL   LHR 16 resp/min   Peep/cpap 5.0 cm H20   pH, Arterial 7.469 (H) 7.350 - 7.450   pCO2 arterial 35.4 32.0 - 48.0 mmHg   pO2, Arterial 198 (H) 83.0 - 108.0 mmHg   Bicarbonate 25.4 20.0 - 28.0 mmol/L   Acid-Base Excess 2.0 0.0 - 2.0 mmol/L   O2 Saturation 99.6 %   Patient temperature 98.6    Collection site LEFT RADIAL    Drawn by [phone]    Sample type ARTERIAL DRAW    Allens test (pass/fail) PASS PASS  Glucose, capillary     Status: Abnormal   Collection Time: 09/23/17  4:37 AM  Result Value Ref Range   Glucose-Capillary 217 (H) 65 - 99 mg/dL   Comment 1 Capillary Specimen   Glucose, capillary     Status: Abnormal   Collection Time: 09/23/17  7:22 AM  Result Value Ref Range   Glucose-Capillary 199 (H) 65 - 99 mg/dL   Comment 1 Capillary Specimen    Comment 2 Notify RN   CBC     Status: Abnormal   Collection Time: 09/23/17  7:40 AM  Result Value Ref Range   WBC 12.8 (H) 4.0 - 10.5 K/uL   RBC 3.49 (L) 3.87 - 5.11 MIL/uL   Hemoglobin 9.2 (L) 12.0 - 15.0 g/dL   HCT 29.6 (L) 36.0 - 46.0 %   MCV 84.8 78.0 - 100.0 fL   MCH 26.4 26.0 - 34.0 pg   MCHC 31.1 30.0 - 36.0 g/dL   RDW 15.5 11.5 - 15.5 %   Platelets 338 150 - 400 K/uL  Basic metabolic panel     Status: Abnormal   Collection Time: 09/23/17  7:40 AM  Result Value Ref Range   Sodium 140 135 - 145 mmol/L   Potassium 3.7 3.5 - 5.1 mmol/L   Chloride 106 101 - 111 mmol/L   CO2 25 22 - 32 mmol/L   Glucose, Bld 220 (H) 65 - 99 mg/dL   BUN 18 6 - 20  mg/dL   Creatinine, Ser 0.63 0.44 - 1.00 mg/dL   Calcium 9.0 8.9 - 10.3 mg/dL   GFR calc non Af Amer >60 >60 mL/min   GFR calc Af Amer >60 >60 mL/min   Anion gap 9 5 - 15  Magnesium     Status: None   Collection Time: 09/23/17  7:40 AM  Result Value Ref Range   Magnesium 1.7 1.7 - 2.4 mg/dL  Phosphorus     Status: None   Collection Time: 09/23/17  7:40 AM  Result Value Ref Range   Phosphorus 3.2 2.5 - 4.6 mg/dL  Vancomycin, trough     Status: Abnormal   Collection Time: 09/23/17  7:40 AM  Result Value Ref Range   Vancomycin Tr 14 (L) 15 - 20 ug/mL  Glucose, capillary     Status: Abnormal   Collection Time: 09/23/17 10:58 AM  Result Value Ref Range   Glucose-Capillary 196 (H) 65 - 99 mg/dL   Comment 1 Capillary Specimen    Comment 2 Notify RN   Glucose, capillary     Status: Abnormal   Collection Time: 09/23/17  3:12 PM  Result Value Ref Range   Glucose-Capillary 166 (H) 65 - 99 mg/dL   Comment 1 Capillary Specimen    Comment 2 Notify RN    *Note: Due to a large number of results and/or encounters for the requested time period, some results have not been displayed. A complete set of results can be found in Results Review.    SUBJECTIVE:  Min  throat pain.  Aspiration upon attempting swallow of H2O.  Breathing easily.  Reports anxiety.  OBJECTIVE:  Voice raspy but phonatory.  No stridor or labor  IMPRESSION:  Satisfactory check  PLAN:  Very mild IV ativan to help anxiety without causing any resp depression.   [name]

## 2017-09-23 NOTE — Transfer of Care (Signed)
Immediate Anesthesia Transfer of Care Note  Patient: [name]  Procedure(s) Performed: FLEXIBLE FIBEROPTIC LARYNGOSCOPY WITH TRIAL EXTUBATION (N/A ) POSSIBLE DIRECT LARYNGOSCOPY (N/A )  Patient Location: ICU  Anesthesia Type:General  Level of Consciousness: awake, alert  and oriented  Airway & Oxygen Therapy: Patient Spontanous Breathing and Patient connected to nasal cannula oxygen  Post-op Assessment: Report given to RN and Post -op Vital signs reviewed and stable  Post vital signs: Reviewed and stable  Last Vitals:  Vitals:   09/23/17 1100 09/23/17 1131  BP: (!) 159/63 (!) 159/63  Pulse: 70 (!) 59  Resp: 14 (!) 30  Temp: 37.2 C   SpO2: 100% 100%    Last Pain:  Vitals:   09/23/17 1100  TempSrc: Axillary  PainSc:          Complications: No apparent anesthesia complications

## 2017-09-23 NOTE — Progress Notes (Signed)
Pharmacy Antibiotic Note  [name] is a 56 y.o. female admitted on 09/20/2017 with epiglotitis.  Patient is currently on vancomycin 750 gm IV q 12 hours. A vancomycin trough collected early (~8.5 hrs) was 14. Ke = 0.078. T 1/2 ~9hrs. True trough was likely ~ 11.5. Will aim for a trough closer to 15.  WBC down 12.8, now afebrile   Vancomycin 12/11 >> Ceftriaxone 12/11 >>  12/12 MRSA pcr + 12/11 Group A Strep >> neg   Plan: Increase vancomycin to 1000 mg IV q12H Continue ceftriaxone per MD Monitor clinical picture, renal function, VT prn F/U C&S, abx deescalation / LOT   Weight: 254 lb 3.1 oz (115.3 kg)  Temp (24hrs), Avg:99 F (37.2 C), Min:98.7 F (37.1 C), Max:99.8 F (37.7 C)  Recent Labs  Lab 09/20/17 1418 09/20/17 1431 09/21/17 0316 09/22/17 0253 09/23/17 0740  WBC 21.5*  --  19.4*  --  12.8*  CREATININE  --  0.70 0.74 0.67 0.63  VANCOTROUGH  --   --   --   --  14*    Estimated Creatinine Clearance: 98.9 mL/min (by C-G formula based on SCr of 0.63 mg/dL).    Allergies  Allergen Reactions  . Methotrexate Other (See Comments)    Peri-oral and buccal lesions  . Vancomycin Other (See Comments)    Nephrotoxicity   . Clindamycin/Lincomycin Nausea And Vomiting and Rash  . Chlorhexidine Itching  . Doxycycline Rash  . Lisinopril Cough  . Teflaro [Ceftaroline] Rash    Tolerates ceftriaxone    Thank you for allowing pharmacy to be a part of this patient's care.  Albertina Parr, PharmD., BCPS Clinical Pharmacist Pager [phone]

## 2017-09-23 NOTE — Progress Notes (Signed)
Pt has a positive cuff leak. 

## 2017-09-23 NOTE — Anesthesia Postprocedure Evaluation (Signed)
Anesthesia Post Note  Patient: [name]  Procedure(s) Performed: FLEXIBLE FIBEROPTIC LARYNGOSCOPY (N/A Throat) EXTUBATION (ENDOTRACHEAL) IN OR (N/A Throat)     Patient location during evaluation: PACU Anesthesia Type: MAC Level of consciousness: awake and alert Pain management: pain level controlled Vital Signs Assessment: post-procedure vital signs reviewed and stable Respiratory status: spontaneous breathing, nonlabored ventilation, respiratory function stable and patient connected to nasal cannula oxygen Cardiovascular status: stable and blood pressure returned to baseline Postop Assessment: no apparent nausea or vomiting Anesthetic complications: no    Last Vitals:  Vitals:   09/23/17 1200 09/23/17 1300  BP: (!) 177/73 (!) 153/62  Pulse: 63 71  Resp: 15 15  Temp:    SpO2: 100% 93%    Last Pain:  Vitals:   09/23/17 1100  TempSrc: Axillary  PainSc:                  Aleina Burgio,W. EDMOND

## 2017-09-23 NOTE — Progress Notes (Signed)
ENT Consult Progress Note  Subjective: Patient awake, responding appropriately. Patient states throat pain greatly improved in last day or so. Afebrile. Minimal vent settings.    Objective: Vitals:   09/23/17 0600 09/23/17 0712  BP: (!) 151/65 (!) 171/65  Pulse: 62 72  Resp: 16 (!) 23  Temp:    SpO2: 100% 100%    Physical Exam: CONSTITUTIONAL: well developed, nourished, no distress and alert and oriented x 3 CARDIOVASCULAR: normal rate and regular rhythm PULMONARY/CHEST WALL: effort normal and no stridor, no stertor, no dysphonia Vent Mode: PRVC FiO2 (%):  [40 %] 40 % Set Rate:  [16 bmp] 16 bmp Vt Set:  [470 mL] 470 mL PEEP:  [5 cmH20] 5 cmH20 Plateau Pressure:  [11 cmH20-19 cmH20] 13 cmH20  HENT: Head : normocephalic and atraumatic, FOM is soft.  Ears: Right ear:   canal normal, external ear normal and hearing normal Left ear:   canal normal, external ear normal and hearing normal Nose: nose normal and no purulence, some dried blood at right anterior septum, tube secured with 0 silk suture to septum.  Mouth/Throat:  Mouth: uvula midline and no oral lesions Throat: oropharynx clear and moist Mucous membranes: normal EYES: conjunctiva normal, EOM normal and PERRL NECK: soft, trachea normal and no thyromegaly or cervical LAD     Data Review/Consults/Discussions:  CBC pending BMP pending, last Cr yesterday WNL Vanc trough pending  Assessment: [name] 56 y.o. female who presents with epiglottitis/supraglottitis. Was stridulous and emergently intubated fiberoptically in the OR on 12/11. Has been improving subjectively on IV vancomycin and rocephin.    Plan:  -Please assess for cuff leak. If positive, we will proceed to OR at 11:15am. -Continue to wean sedation- would like patient very awake for trial extubation in the OR today over a Bougie. -NPO until after OR.    Thank you for involving [facility] in the care of this patient. Should  you need further assistance, please call our office at [phone].    Gavin Pound, MD

## 2017-09-23 NOTE — Progress Notes (Signed)
PCCM Progress Note  Admission date: 09/20/2017 Consult date: 09/20/2017 Referring provider: Dr. Argentina Donovan, ENT  CC: odynophagia  Brief description: 56 yo female presented with difficulty swallowing, stridor and chest pain x 2 days.  Nasally intubated.  Found to have supraglottitis.  She is followed in pulmonary office by Dr. Vaughan Browner and Dr. Casper Harrison at [facility].  PMHx of Sarcoidosis with ocular involvement on 5 mg prednisone daily, Chronic bronchitis, HTN, HLD, Gout, GERD, DM, Depression, Diastolic CHF.  Subjective: Denies chest/abdominal pain.  Vital signs: BP (!) 159/57   Pulse 70   Temp 99.8 F (37.7 C) (Axillary)   Resp 14   Wt 254 lb 3.1 oz (115.3 kg)   SpO2 100%   BMI 41.03 kg/m   Intake/output: I/O last 3 completed shifts: In: 3168 [I.V.:2968; IV Piggyback:200] Out: 5405 [Urine:5405]  Physical exam: General - pleasant Eyes - pupils reactive ENT - nasal endotracheal tube in place Cardiac - regular, no murmur Chest - no wheeze, rales Abd - soft, non tender Ext - no edema Skin - no rashes Neuro - normal strength  CBC Recent Labs    09/20/17 1418 09/20/17 1431 09/21/17 0316 09/23/17 0740  WBC 21.5*  --  19.4* 12.8*  HGB 10.4* 10.5* 9.3* 9.2*  HCT 33.0* 31.0* 30.2* 29.6*  PLT 359  --  315 338    Coag's No results for input(s): APTT, INR in the last 72 hours.  BMET Recent Labs    09/21/17 0316 09/22/17 0253 09/23/17 0740  NA 137 140 140  K 4.0 3.8 3.7  CL 106 109 106  CO2 21* 23 25  BUN 12 16 18   CREATININE 0.74 0.67 0.63  GLUCOSE 247* 225* 220*    Electrolytes Recent Labs    09/21/17 0316 09/22/17 0253 09/23/17 0740  CALCIUM 8.6* 8.9 9.0  MG 1.8 1.9 1.7  PHOS 2.9 3.4 3.2    Sepsis Markers No results for input(s): PROCALCITON, O2SATVEN in the last 72 hours.  Invalid input(s): LACTICACIDVEN  ABG Recent Labs    09/20/17 2042 09/23/17 0325  PHART 7.295* 7.469*  PCO2ART 50.4* 35.4  PO2ART 303* 198*    Liver Enzymes No results  for input(s): AST, ALT, ALKPHOS, BILITOT, ALBUMIN in the last 72 hours.  Cardiac Enzymes No results for input(s): TROPONINI, PROBNP in the last 72 hours.  Glucose Recent Labs    09/22/17 1158 09/22/17 1511 09/22/17 1934 09/22/17 2312 09/23/17 0437 09/23/17 0722  GLUCAP 227* 214* 194* 199* 217* 199*    Imaging Dg Chest Port 1 View  Result Date: 09/23/2017 CLINICAL DATA:  Intubation. EXAM: PORTABLE CHEST 1 VIEW COMPARISON:  09/22/2017 . FINDINGS: Endotracheal tube in stable position. Cardiomegaly. Low lung volumes . Mild bibasilar subsegmental atelectasis. Tiny left pleural effusion. These findings have improved from prior exam. No pneumothorax. IMPRESSION: 1. Endotracheal tube in stable position. 2. Low lung volumes with mild basilar atelectasis. Small left pleural effusion. These findings have improved from prior exam . 3. Stable cardiomegaly. Electronically Signed   By: Marcello Moores  Register   On: 09/23/2017 06:10   Dg Chest Port 1 View  Result Date: 09/22/2017 CLINICAL DATA:  Respiratory failure. EXAM: PORTABLE CHEST 1 VIEW COMPARISON:  09/21/2017. FINDINGS: Endotracheal tube in stable position. Stable cardiomegaly. Improvement of bibasilar atelectasis. Interim improvement of small left pleural effusion. No pneumothorax. IMPRESSION: 1. Endotracheal tube in stable position. 2. Stable cardiomegaly. 3. Interim improvement of bibasilar atelectasis. Improvement of small left pleural effusion. Electronically Signed   By: Marcello Moores  Register   On:  09/22/2017 06:18    Studies: PFT 02/12/16 >> FEV1 1.85 (65%), FEV1% 87, TLC 4.01 (75%), DLCO 65% CT neck 09/20/17 >> diffuse inferior pharyngeal soft tissue thickening  Cultures: Throat swab 12/11 >> negative for Group A Strep  Antibiotics: Vancomycin 12/11 >> Rocephin 12/11 >>  Lines/tubes: Nasal intubation 12/11 >>  Events: 12/11 Admit 12/14 To OR  Assessment/plan:  Acute respiratory failure from epiglottis/supraglottis. - continue  Abx - to OR to assess airway 12/14  Hx of systemic sarcoidosis on chronic prednisone, chronic bronchitis. - brovana, pulmicort - IV somedrol until able to take pills  DM type II. Hx of gout. - SSI with lantus - hold outpt zyloprim, glucophage  Hx of HTN with chronic diastolic CHF, HLD. - hold outpt ASA, lipitor, coreg, cardizem, tricor, lasix  Anemia of critical illness and chronic disease. - f/u CBC  Hx of depression, neuropathy, chronic pain. - hold outpt wellbutrin, neurontin, ativan, paroxetine, tramadol, roxicet, effexor  DVT prophylaxis - SQ heparin SUP - protonix Nutrition - NPO Goals of care - full code  Updated pt's husband at bedside  [name] Mires, MD [place] 09/23/2017, 9:54 AM Pager:  [phone] After 3pm call: [phone]

## 2017-09-23 NOTE — Care Management Note (Signed)
Case Management Note  Patient Details  Name: [name] MRN: [id number] Date of Birth: 1961/03/15  Subjective/Objective:        Pt admitted with sore throat and CP   - edematous epiglottis requiring intubation         Action/Plan:  [name] now extubated.  [name] from home.  CM will continue to follow for discharge needs   Expected Discharge Date:                  Expected Discharge Plan:  Home/Self Care  In-House Referral:     Discharge planning Services  CM Consult  Post Acute Care Choice:    Choice offered to:     DME Arranged:    DME Agency:     HH Arranged:    HH Agency:     Status of Service:     If discussed at [employer] of Stay Meetings, dates discussed:    Additional Comments:  Maryclare Labrador, RN 09/23/2017, 3:50 PM

## 2017-09-23 NOTE — Anesthesia Preprocedure Evaluation (Addendum)
Anesthesia Evaluation  Patient identified by MRN, date of birth, ID band Patient awake    Reviewed: Allergy & Precautions, H&P , NPO status , Patient's Chart, lab work & pertinent test results, reviewed documented beta blocker date and time   Airway Mallampati: Intubated       Dental no notable dental hx. (+) Teeth Intact, Dental Advisory Given   Pulmonary sleep apnea , COPD,    Pulmonary exam normal breath sounds clear to auscultation       Cardiovascular hypertension, Pt. on medications and Pt. on home beta blockers +CHF   Rhythm:Regular Rate:Normal     Neuro/Psych Depression negative neurological ROS     GI/Hepatic Neg liver ROS, GERD  Medicated and Controlled,  Endo/Other  diabetes, Insulin Dependent, Oral Hypoglycemic AgentsMorbid obesity  Renal/GU Renal disease  negative genitourinary   Musculoskeletal  (+) Arthritis , Osteoarthritis,    Abdominal   Peds  Hematology negative hematology ROS (+) anemia ,   Anesthesia Other Findings   Reproductive/Obstetrics negative OB ROS                            Anesthesia Physical Anesthesia Plan  ASA: IV  Anesthesia Plan: MAC   Post-op Pain Management:    Induction: Intravenous  PONV Risk Score and Plan: 2 and Treatment may vary due to age or medical condition  Airway Management Planned: Natural Airway  Additional Equipment:   Intra-op Plan:   Post-operative Plan: Extubation in OR  Informed Consent: I have reviewed the patients History and Physical, chart, labs and discussed the procedure including the risks, benefits and alternatives for the proposed anesthesia with the patient or authorized representative who has indicated his/her understanding and acceptance.   Dental advisory given  Plan Discussed with: CRNA  Anesthesia Plan Comments:         Anesthesia Quick Evaluation

## 2017-09-23 NOTE — Op Note (Signed)
DATE OF PROCEDURE:  09/23/2017     PRE-OPERATIVE DIAGNOSIS:  EPIGLOTITIS/TRACH IN PLACE     POST-OPERATIVE DIAGNOSIS:  Same     PROCEDURE(S):  Transnasal flexible laryngoscopy Extubation     SURGEON:  Gavin Pound, MD     ASSISTANT(S):  none     ANESTHESIA:  Awake, no sedation    ESTIMATED BLOOD LOSS:  none    SPECIMENS:  none     COMPLICATIONS:  None     OPERATIVE FINDINGS:  The patient's epiglottis and aryepiglottic folds were greatly improved in appearance since intubation on 09/20/17. The epiglottis appears crisp, the arytenoids are mildly edematous. The true cords are readily visible, though do display some boggy edema. The posterior glottis is widely patent. The cord motion is preserved. There are no obvious lesions in the immediate subglottis.      OPERATIVE DETAILS: The patient was brought to the operating room and placed in the seated position. The nose was anesthetized with 2% viscous lidocaine and decongested with topical Afrin spray.  The flexible 67mm laryngoscope was placed into the left nostril with findings as noted above. The nose and oral cavity were suctioned. The suture was removed from the right side of the septum. Anesthesia then placed a Cook catheter into the 6.5 ETT. The cuff was deflated and patient had a large air leak. The endotracheal tube was slowly removed over the Burns, while the laryngoscope was in place. The patient tolerated this well and the airway was patent. The ETT was then slowly backed away. The cords and subglottis were observed with the flexible laryngoscope. Photodocumentation was obtained. The patient tolerated this very well. All instrumentation was removed and the patient was then returned to the care of the anestehsia staff. She was transported to her room in the ICU.

## 2017-09-24 ENCOUNTER — Inpatient Hospital Stay (HOSPITAL_COMMUNITY): Payer: BLUE CROSS/BLUE SHIELD

## 2017-09-24 LAB — GLUCOSE, CAPILLARY
Glucose-Capillary: 111 mg/dL — ABNORMAL HIGH (ref 65–99)
Glucose-Capillary: 121 mg/dL — ABNORMAL HIGH (ref 65–99)
Glucose-Capillary: 160 mg/dL — ABNORMAL HIGH (ref 65–99)
Glucose-Capillary: 178 mg/dL — ABNORMAL HIGH (ref 65–99)
Glucose-Capillary: 142 mg/dL — ABNORMAL HIGH (ref 65–99)

## 2017-09-24 LAB — CBC
HCT: 31.7 % — ABNORMAL LOW (ref 36.0–46.0)
Hemoglobin: 9.5 g/dL — ABNORMAL LOW (ref 12.0–15.0)
MCH: 25.9 pg — ABNORMAL LOW (ref 26.0–34.0)
MCHC: 30 g/dL (ref 30.0–36.0)
MCV: 86.4 fL (ref 78.0–100.0)
Platelets: 326 10*3/uL (ref 150–400)
RBC: 3.67 MIL/uL — ABNORMAL LOW (ref 3.87–5.11)
RDW: 15.3 % (ref 11.5–15.5)
WBC: 10.1 10*3/uL (ref 4.0–10.5)

## 2017-09-24 LAB — BASIC METABOLIC PANEL
Anion gap: 10 (ref 5–15)
BUN: 15 mg/dL (ref 6–20)
CO2: 26 mmol/L (ref 22–32)
Calcium: 8.8 mg/dL — ABNORMAL LOW (ref 8.9–10.3)
Chloride: 106 mmol/L (ref 101–111)
Creatinine, Ser: 0.64 mg/dL (ref 0.44–1.00)
GFR calc Af Amer: >60 mL/min (ref >60)
GFR calc non Af Amer: >60 mL/min (ref >60)
Glucose, Bld: 125 mg/dL — ABNORMAL HIGH (ref 65–99)
Potassium: 3 mmol/L — ABNORMAL LOW (ref 3.5–5.1)
Sodium: 142 mmol/L (ref 135–145)

## 2017-09-24 MED ORDER — POTASSIUM CHLORIDE 10 MEQ/100ML IV SOLN
10.0000 meq | INTRAVENOUS | Status: AC
  Administered 2017-09-24 (×6): 10 meq via INTRAVENOUS
  Filled 2017-09-24 (×6): qty 100

## 2017-09-24 MED ORDER — IBUPROFEN 100 MG/5ML PO SUSP
400.0000 mg | ORAL | Status: DC | PRN
  Filled 2017-09-24: qty 20

## 2017-09-24 MED ORDER — KETOROLAC TROMETHAMINE 15 MG/ML IJ SOLN
15.0000 mg | Freq: Three times a day (TID) | INTRAMUSCULAR | Status: DC
  Administered 2017-09-25 – 2017-09-26 (×5): 15 mg via INTRAVENOUS
  Filled 2017-09-24 (×7): qty 1

## 2017-09-24 NOTE — Progress Notes (Signed)
Patient having difficulty with ice chips.  Will speak with CCM about SPL eval.

## 2017-09-24 NOTE — Progress Notes (Signed)
Called report to nurse Hinton Dyer 3MW. Pt swallowing oral secretions without difficulty no audible stridor or visible tongue/throat swelling voice clear. Transferred pt to 3MW 13, pt husband at bedside

## 2017-09-24 NOTE — Progress Notes (Signed)
09/24/2017 12:40 PM  [name] [id number]  Post-Op Day 4/1    Temp:  [97.9 F (36.6 C)-98.7 F (37.1 C)] 98.2 F (36.8 C) (12/15 1133) Pulse Rate:  [57-90] 74 (12/15 1200) Resp:  [7-25] 17 (12/15 1200) BP: (125-168)/(54-81) 150/69 (12/15 1200) SpO2:  [86 %-100 %] 94 % (12/15 1200) Weight:  [110.4 kg (243 lb 6.2 oz)] 110.4 kg (243 lb 6.2 oz) (12/15 0600),     Intake/Output Summary (Last 24 hours) at 09/24/2017 1240 Last data filed at 09/24/2017 1200 Gross per 24 hour  Intake 1182.48 ml  Output 1635 ml  Net -452.52 ml    Results for orders placed or performed during the hospital encounter of 09/20/17 (from the past 24 hour(s))  Glucose, capillary     Status: Abnormal   Collection Time: 09/23/17  3:12 PM  Result Value Ref Range   Glucose-Capillary 166 (H) 65 - 99 mg/dL   Comment 1 Capillary Specimen    Comment 2 Notify RN   Glucose, capillary     Status: Abnormal   Collection Time: 09/23/17  7:37 PM  Result Value Ref Range   Glucose-Capillary 138 (H) 65 - 99 mg/dL   Comment 1 Capillary Specimen    Comment 2 Notify RN   Glucose, capillary     Status: Abnormal   Collection Time: 09/23/17 11:09 PM  Result Value Ref Range   Glucose-Capillary 149 (H) 65 - 99 mg/dL   Comment 1 Capillary Specimen    Comment 2 Notify RN   Glucose, capillary     Status: Abnormal   Collection Time: 09/24/17  3:06 AM  Result Value Ref Range   Glucose-Capillary 111 (H) 65 - 99 mg/dL   Comment 1 Capillary Specimen    Comment 2 Notify RN   Basic metabolic panel     Status: Abnormal   Collection Time: 09/24/17  3:56 AM  Result Value Ref Range   Sodium 142 135 - 145 mmol/L   Potassium 3.0 (L) 3.5 - 5.1 mmol/L   Chloride 106 101 - 111 mmol/L   CO2 26 22 - 32 mmol/L   Glucose, Bld 125 (H) 65 - 99 mg/dL   BUN 15 6 - 20 mg/dL   Creatinine, Ser 0.64 0.44 - 1.00 mg/dL   Calcium 8.8 (L) 8.9 - 10.3 mg/dL   GFR calc non Af Amer >60 >60 mL/min   GFR calc Af Amer >60 >60 mL/min   Anion gap  10 5 - 15  CBC     Status: Abnormal   Collection Time: 09/24/17  3:56 AM  Result Value Ref Range   WBC 10.1 4.0 - 10.5 K/uL   RBC 3.67 (L) 3.87 - 5.11 MIL/uL   Hemoglobin 9.5 (L) 12.0 - 15.0 g/dL   HCT 31.7 (L) 36.0 - 46.0 %   MCV 86.4 78.0 - 100.0 fL   MCH 25.9 (L) 26.0 - 34.0 pg   MCHC 30.0 30.0 - 36.0 g/dL   RDW 15.3 11.5 - 15.5 %   Platelets 326 150 - 400 K/uL  Glucose, capillary     Status: Abnormal   Collection Time: 09/24/17  7:25 AM  Result Value Ref Range   Glucose-Capillary 121 (H) 65 - 99 mg/dL   Comment 1 Capillary Specimen    Comment 2 Notify RN   Glucose, capillary     Status: Abnormal   Collection Time: 09/24/17 11:35 AM  Result Value Ref Range   Glucose-Capillary 160 (H) 65 - 99 mg/dL   Comment  1 Capillary Specimen    Comment 2 Notify RN    *Note: Due to a large number of results and/or encounters for the requested time period, some results have not been displayed. A complete set of results can be found in Results Review.    SUBJECTIVE:  Less throat pain.  Breathing fine. Voice better.  Trouble swallowing ice chips per nurses.  OBJECTIVE:   Voice strong.  Breathing easily.  IMPRESSION:  Satisfactory check.  Still significant dysphagia.  PLAN:    Out of ICU to stepdown.  Transition to po abx ( augmentin or similar) when able to take po.  .  D/c Foley.  Advance activity.  [name]

## 2017-09-24 NOTE — Progress Notes (Signed)
Wasted 235 mL Fentanyl, in sink, with Willette Pa, RN.

## 2017-09-24 NOTE — Progress Notes (Signed)
SLP Cancellation Note  Patient Details Name: SOLIYANA MCCHRISTIAN MRN: [id number] DOB: 11-15-1960   Cancelled treatment:       Reason Eval/Treat Not Completed: Medical issues which prohibited therapy. Spoke with RN; per Dr. Erik Obey will f/u next date for evaluation. Anticipate pt will need objective testing.   Deneise Lever, [place], [place] Speech-Language Pathologist [phone]   Aliene Altes 09/24/2017, 1:37 PM

## 2017-09-24 NOTE — Progress Notes (Signed)
CRITICAL CARE PROGRESS NOTE  PCCM Progress Note  Admission date: 09/20/2017 Consult date: 09/20/2017 Referring provider: Dr. Argentina Donovan, ENT    Patient Summary:     CC: odynophagia  Brief description: 56 yo female presented with difficulty swallowing, stridor and chest pain x 2 days.  Nasally intubated.  Found to have supraglottitis.  She is followed in pulmonary office by Dr. Vaughan Browner and Dr. Casper Harrison at [facility].  PMHx of Sarcoidosis with ocular involvement on 5 mg prednisone daily, Chronic bronchitis, HTN, HLD, Gout, GERD, DM, Depression, Diastolic CHF.   New Events  Tolerating extubation well; No stridor; Coughed with ice chips. SLP eval pending. Hoarse voice     PHYSICAL EXAM:   NAD, morbidly obese Blood pressure (!) 150/69, pulse 74, temperature 98.2 F (36.8 C), temperature source Oral, resp. rate 17, weight 243 lb 6.2 oz (110.4 kg), SpO2 94 %. Eyes: Pupils equal,No pallor Neck: No neck swelling/ stridor [name]:  -s1 s2 regular Resp:Breath sounds equal; no rales/rhonchi Abdomen:abd-soft, non tender,    BS+ Extremities:No edema Neuro:Awake, responsive Tone  Normal Skin: no rash Integumentary: No clubbing    Current Facility-Administered Medications:  .  albuterol (PROVENTIL) (2.5 MG/3ML) 0.083% nebulizer solution 2.5 mg, 2.5 mg, Nebulization, Q3H PRN, Desai, Rahul P, PA-C .  arformoterol (BROVANA) nebulizer solution 15 mcg, 15 mcg, Nebulization, BID, Desai, Rahul P, PA-C, 15 mcg at 09/24/17 0801 .  budesonide (PULMICORT) nebulizer solution 0.5 mg, 0.5 mg, Nebulization, BID, Desai, Rahul P, PA-C, 0.5 mg at 09/24/17 0801 .  cefTRIAXone (ROCEPHIN) 1 g in dextrose 5 % 50 mL IVPB, 1 g, Intravenous, Q24H, Reginia Naas, RPH, Stopped at 09/23/17 1901 .  chlorhexidine (PERIDEX) 0.12 % solution 15 mL, 15 mL, Mouth Rinse, BID, Sood, Vineet, MD, 15 mL at 09/24/17 0953 .  heparin injection 5,000 Units, 5,000 Units, Subcutaneous, Q8H, Desai, Rahul P, PA-C, 5,000 Units at  09/24/17 0503 .  insulin aspart (novoLOG) injection 0-20 Units, 0-20 Units, Subcutaneous, Q4H, Desai, Rahul P, PA-C, 4 Units at 09/24/17 1143 .  insulin glargine (LANTUS) injection 10 Units, 10 Units, Subcutaneous, BID, Chesley Mires, MD, 10 Units at 09/24/17 [phone] .  LORazepam (ATIVAN) injection 0.25 mg, 0.25 mg, Intravenous, Q4H PRN, Helayne [place]in[place], MD, 0.25 mg at 09/24/17 0412 .  MEDLINE mouth rinse, 15 mL, Mouth Rinse, q12n4p, Sood, Vineet, MD, 15 mL at 09/24/17 1144 .  methylPREDNISolone sodium succinate (SOLU-MEDROL) 40 mg/mL injection 10 mg, 10 mg, Intravenous, Daily, Chesley Mires, MD, 10 mg at 09/24/17 0951 .  mupirocin ointment (BACTROBAN) 2 % 1 application, 1 application, Nasal, BID, Marcellino, [place] Jet[place], MD, 1 application at 71/69/67 [phone] .  pantoprazole (PROTONIX) injection 40 mg, 40 mg, Intravenous, Daily, Desai, Rahul P, PA-C, 40 mg at 09/24/17 0951 .  vancomycin (VANCOCIN) IVPB 1000 mg/200 mL premix, 1,000 mg, Intravenous, Q12H, Lavenia Atlas, RPH, Stopped at 09/24/17 1148  LABS:  Results for HAJIRA, VERHAGEN (MRN [id number]) as of 09/24/2017 12:41  Ref. Range 09/24/2017 03:56  Sodium Latest Ref Range: 135 - 145 mmol/L 142  Potassium Latest Ref Range: 3.5 - 5.1 mmol/L 3.0 (L)  Chloride Latest Ref Range: 101 - 111 mmol/L 106  CO2 Latest Ref Range: 22 - 32 mmol/L 26  Glucose Latest Ref Range: 65 - 99 mg/dL 125 (H)  BUN Latest Ref Range: 6 - 20 mg/dL 15  Creatinine Latest Ref Range: 0.44 - 1.00 mg/dL 0.64  Calcium Latest Ref Range: 8.9 - 10.3 mg/dL 8.8 (L)  Anion gap Latest Ref Range: 5 -  15  10  Results for NAVINA, WOHLERS (MRN [id number]) as of 09/24/2017 12:41  Ref. Range 09/24/2017 03:56  WBC Latest Ref Range: 4.0 - 10.5 K/uL 10.1  RBC Latest Ref Range: 3.87 - 5.11 MIL/uL 3.67 (L)  Hemoglobin Latest Ref Range: 12.0 - 15.0 g/dL 9.5 (L)  HCT Latest Ref Range: 36.0 - 46.0 % 31.7 (L)  MCV Latest Ref Range: 78.0 - 100.0 fL 86.4  MCH Latest Ref Range: 26.0 - 34.0  pg 25.9 (L)  MCHC Latest Ref Range: 30.0 - 36.0 g/dL 30.0  RDW Latest Ref Range: 11.5 - 15.5 % 15.3  Platelets Latest Ref Range: 150 - 400 K/uL 326   CHEST X-RAY:  09/24/17 Clear lungs    Ekg-12/12-- sinus- no acute changes    12/11- ct neck inferior pharyngeal soft tissue thickening with involvement of the epiglottis and supraglottic larynx, likely infectious. Correlate with direct visualization and clinical follow-up to exclude neoplasm. No abscess.   Assessment/plan:  S/p Acute respiratory failure from epiglottis/supraglottis.[name]. -Hx of systemic sarcoidosis on chronic prednisone, chronic bronchitis. - brovana, pulmicort while hospitalized; then revert to  Home regimen. - IV somedrol until able to take pills- was on pred 5mg /d po.  DM type II. Hx of gout.   Hx of HTN with chronic diastolic CHF, HLD. -resume  Home meds when taking po.  Anemia of critical illness and chronic disease. - f/u CBC  Hx of depression, neuropathy, chronic pain. - hold outpt wellbutrin, neurontin, ativan, paroxetine, tramadol, roxicet, effexor-resume when taking po.  DVT prophylaxis - SQ heparin SUP - protonix Nutrition - NPO Goals of care - full code  Updated pt's husband at bedside.   Txfr stepdown unit.   F/u with pulmonary when discharged.   Thank you for letting me participate in the care of your patient.  ^^^^^^^^^^ I  Have personally spent   60  Minutes  In the care of this Patient providing Critical care Services; Time includes review of chart, labs, imaging, coordinating care with other physicians and healthcare team members. Also includes time for frequent reevaluation and additional treatment implementation due to change in clinical condiiton of patient. Excludes time spent for Procedure and Teaching.   ^^^^^^^^^^  Note subject to typographical and grammatical errors;   Any formal questions or concerns about the content, text, or information contained within the  body of this dictation should be directly addressed to the physician  for  clarification.   Evans Lance, MD Pulmonary and [place] Pager: [phone]

## 2017-09-24 NOTE — Progress Notes (Signed)
[facility] Progress Note Patient Name: [name] DOB: Mar 24, 1961 MRN: [id number]   Date of Service  09/24/2017  HPI/Events of Note  Hypokalemia  eICU Interventions  Potassium replaced     Intervention Category Intermediate Interventions: Electrolyte abnormality - evaluation and management  DETERDING,ELIZABETH 09/24/2017, 4:56 AM

## 2017-09-25 ENCOUNTER — Inpatient Hospital Stay (HOSPITAL_COMMUNITY): Payer: BLUE CROSS/BLUE SHIELD

## 2017-09-25 ENCOUNTER — Other Ambulatory Visit: Payer: Self-pay

## 2017-09-25 LAB — GLUCOSE, CAPILLARY
Glucose-Capillary: 112 mg/dL — ABNORMAL HIGH (ref 65–99)
Glucose-Capillary: 114 mg/dL — ABNORMAL HIGH (ref 65–99)
Glucose-Capillary: 120 mg/dL — ABNORMAL HIGH (ref 65–99)
Glucose-Capillary: 124 mg/dL — ABNORMAL HIGH (ref 65–99)
Glucose-Capillary: 165 mg/dL — ABNORMAL HIGH (ref 65–99)
Glucose-Capillary: 167 mg/dL — ABNORMAL HIGH (ref 65–99)
Glucose-Capillary: 178 mg/dL — ABNORMAL HIGH (ref 65–99)

## 2017-09-25 MED ORDER — RESOURCE THICKENUP CLEAR PO POWD
ORAL | Status: DC | PRN
  Filled 2017-09-25: qty 125

## 2017-09-25 NOTE — Progress Notes (Signed)
Patient able to swallow food , take a deep breath, then cough out with (2) slow swallows.  Patient excited about starting to eat again and retraning her epiglottis.  Goals for tomorrow is to start ambulating in the halls.

## 2017-09-25 NOTE — Evaluation (Signed)
Clinical/Bedside Swallow Evaluation Patient Details  Name: [name] MRN: [id number] Date of Birth: 1961-01-11  Today's Date: 09/25/2017 Time: SLP Start Time (ACUTE ONLY): 0945 SLP Stop Time (ACUTE ONLY): 0100 SLP Time Calculation (min) (ACUTE ONLY): 915 min  Past Medical History:  Past Medical History:  Diagnosis Date  . Abnormal SPEP 04/17/2014  . Acute left ankle pain 01/26/2017  . ANEMIA-UNSPECIFIED 09/18/2009  . CHF (congestive heart failure) ([place])   . Chronic diastolic heart failure, NYHA class 2 (HCC)    LVEDP roughly 20% by cath  . COPD (chronic obstructive pulmonary disease) ([place])   . Depression   . DIABETES MELLITUS, TYPE II 08/21/2006  . Diabetic osteomyelitis ([place]) 05/29/2015  . Fracture of 5th metatarsal    non union  . GERD 08/21/2006  . GOUT 08/20/2010  . Hx of umbilical hernia repair   . HYPERLIPIDEMIA 08/21/2006  . HYPERTENSION 08/21/2006  . Infection of wound due to methicillin resistant Staphylococcus aureus (MRSA)   . Internal hemorrhoids   . Multiple allergies 10/14/2016  . OBESITY 06/04/2009  . Onychomycosis 10/27/2015  . Osteomyelitis of left foot ([place]) 05/29/2015  . Pulmonary sarcoidosis ([place])    Followed locally by pulmonology, but also by Dr. Casper Harrison at [facility]  . Right knee pain 01/26/2017  . Vocal cord dysfunction   . Wears partial dentures    Past Surgical History:  Past Surgical History:  Procedure Laterality Date  . ABDOMINAL HYSTERECTOMY    . APPENDECTOMY    . BLADDER SUSPENSION  11/11/2011   Procedure: TRANSVAGINAL TAPE (TVT) PROCEDURE;  Surgeon: Olga Millers, MD;  Location: [facility];  Service: Gynecology;  Laterality: N/A;  . CARDIAC CATHETERIZATION  07/2010   LVEF 50-55% WITH VERY MILD GLOBAL HYPOKINESIA; ESSENTIALLY NORMAL CORONARY ARTERIES; NORMAL LV FUNCTION  . CAROTIDS  02/18/11   CAROTID DUPLEX; VERTEBRALS ARE PATENT WITH ANTEGRADE FLOW. ICA/CCA RATIO 1.61 ON RIGHT AND 0.75 ON LEFT  . CHOLECYSTECTOMY  1984  .  CYSTOSCOPY  11/11/2011   Procedure: CYSTOSCOPY;  Surgeon: Olga Millers, MD;  Location: [facility];  Service: Gynecology;  Laterality: N/A;  . DOPPLER ECHOCARDIOGRAPHY  02/12/2013   LV FUNCTION, SIZE NORMAL; MILD CONCENTRIC LVH; EST EF 55-65%; WALL MOTION NORMAL  . FRACTURE SURGERY     foot  . HERNIA REPAIR    . I&D EXTREMITY Left 06/27/2015   Procedure: Partial Excision Left Calcaneus, Place Antibiotic Beads, and Wound VAC;  Surgeon: Newt Minion, MD;  Location: [facility];  Service: Orthopedics;  Laterality: Left;  . KNEE ARTHROSCOPY     right  . LEFT AND RIGHT HEART CATHETERIZATION WITH CORONARY ANGIOGRAM N/A 04/23/2013   Procedure: LEFT AND RIGHT HEART CATHETERIZATION WITH CORONARY ANGIOGRAM;  Surgeon: Leonie Man, MD;  Location: [facility] CATH LAB;  Service: Cardiovascular;  Laterality: N/A;  . LEFT HEART CATH AND CORONARY ANGIOGRAPHY N/A 03/11/2017   Procedure: Left Heart Cath and Coronary Angiography;  Surgeon: Sherren Mocha, MD;  Location: [facility] CV [facility];  Service: Cardiovascular;  Laterality: N/A;  . LexiScan Myoview  03/09/2013   EF 50%; NORMAL MYOCARDIAL PERFUSION STUDY - breast attenuation  . METATARSAL OSTEOTOMY WITH OPEN REDUCTION INTERNAL FIXATION (ORIF) METATARSAL WITH FUSION Left 04/09/2014   Procedure: LEFT FOOT FRACTURE OPEN TREATMENT METATARSAL INCLUDES INTERNAL FIXATION EACH;  Surgeon: Lorn Junes, MD;  Location: [facility];  Service: Orthopedics;  Laterality: Left;  . NISSEN FUNDOPLICATION  6967  . Right and left CARDIAC CATHETERIZATION  04/23/2013   Angiographic  normal coronaries; LVEDP 20 mmHg, PCWP 12-14 mmHg, RAP 12 mmHg.; Fick CO/CI 4.9/2.2  . TOTAL KNEE ARTHROPLASTY Right 06/29/2017   Procedure: RIGHT TOTAL KNEE ARTHROPLASTY;  Surgeon: Newt Minion, MD;  Location: [facility];  Service: Orthopedics;  Laterality: Right;  . TRACHEOSTOMY TUBE PLACEMENT N/A 09/20/2017   Procedure: AWAKE INTUBATION WITH ANESTHESIA WITH VIDEO ASSISTANCE;  Surgeon: Helayne Seminole, MD;  Location: [place] [facility];  Service: ENT;  Laterality: N/A;  . TUBAL LIGATION     with reversal in 1994  . VENTRAL HERNIA REPAIR     HPI:  56 yo female presented with difficulty swallowing, stridor and chest pain x 2 days.  In ED, she was seen by ENT and had transnasal flexible laryngoscopy that demonstrated an erythematous and edematous epiglottis with small blister, very edematous arytenoids and false cords. Findings c/w supraglottitis. She was subsequently taken to OR where she had an awake fiberoptic nasal intubation for airway protection. PMHx of Sarcoidosis with ocular involvement on 5 mg prednisone daily, Chronic bronchitis, HTN, HLD, Gout, GERD, DM, Depression, Diastolic CHF.   Assessment / Plan / Recommendation Clinical Impression  Patient presents with overt signs of aspiration with sips of thin liquids. She requires multiple swallows, with intermittent throat clearing and delayed coughing noted, concerning for decreased airway protection. Hyolaryngeal movement feels reduced to palpation. Recommend instrumental testing prior to initiating PO; will proceed with MBS (FEES not available on weekends). Pt, husband in agreement. Will update recommendations after completion of MBS.  SLP Visit Diagnosis: Dysphagia, unspecified (R13.10)    Aspiration Risk  Moderate aspiration risk    Diet Recommendation NPO;Ice chips PRN after oral care   Medication Administration: Via alternative means    Other  Recommendations Oral Care Recommendations: Oral care QID;Oral care prior to ice chip/H20   Follow up Recommendations Other (comment)(tbd)      Frequency and Duration            Prognosis Prognosis for Safe Diet Advancement: Good      Swallow Study   General Date of Onset: 09/20/17 HPI: 56 yo female presented with difficulty swallowing, stridor and chest pain x 2 days.  In ED, she was seen by ENT and had transnasal flexible laryngoscopy that demonstrated an erythematous and edematous  epiglottis with small blister, very edematous arytenoids and false cords. Findings c/w supraglottitis. She was subsequently taken to OR where she had an awake fiberoptic nasal intubation for airway protection. PMHx of Sarcoidosis with ocular involvement on 5 mg prednisone daily, Chronic bronchitis, HTN, HLD, Gout, GERD, DM, Depression, Diastolic CHF. Type of Study: Bedside Swallow Evaluation Previous Swallow Assessment: none in chart Diet Prior to this Study: NPO Temperature Spikes Noted: No Respiratory Status: Room air History of Recent Intubation: Yes Length of Intubations (days): 3 days Date extubated: 09/23/17 Behavior/Cognition: Alert;Cooperative;Pleasant mood Oral Cavity Assessment: Within Functional Limits Oral Care Completed by SLP: Recent completion by staff Oral Cavity - Dentition: Dentures, top;Dentures, bottom Vision: Functional for self-feeding Self-Feeding Abilities: Able to feed self Patient Positioning: Upright in bed Baseline Vocal Quality: Hoarse(mild hoarseness) Volitional Cough: Strong Volitional Swallow: Able to elicit    Oral/Motor/Sensory Function Overall Oral Motor/Sensory Function: Within functional limits   Ice Chips Ice chips: Within functional limits Presentation: Spoon   Thin Liquid Thin Liquid: Impaired Presentation: Cup;Spoon;Self Fed Pharyngeal  Phase Impairments: Multiple swallows;Cough - Delayed;Throat Clearing - Immediate;Decreased hyoid-laryngeal movement    Nectar Thick Nectar Thick Liquid: Not tested   Honey Thick Honey Thick Liquid: Not tested  Puree Puree: Not tested   Solid   GO   Solid: Not tested       Deneise Lever, MS, CCC-SLP Speech-Language Pathologist [phone]  Aliene Altes 09/25/2017,10:09 AM

## 2017-09-25 NOTE — Progress Notes (Signed)
Modified Barium Swallow Progress Note  Patient Details  Name: [name] MRN: [id number] Date of Birth: 10/23/1960  Today's Date: 09/25/2017  Modified Barium Swallow completed.  Full report located under Chart Review in the Imaging Section.  Brief recommendations include the following:  Clinical Impression  Patient presents with moderate pharyngeal and mild pharyngoesophageal dysphagia. Oral stage grossly WFL. Pharyngeal stage characterized by incomplete epiglottic deflection, decreased hyolaryngeal excursion and reduced pharyngeal constriction. Slightly anterior tilt of epiglottis; thin and nectar-thick liquids spill to pyriforms and occasionally into the airway prior to filling of valleculae, with penetration, aspiration before the swallow. Trace aspiration also occurs during the swallow with thin and nectar thick liquids due to decreased laryngeal closure. Pt inconsistently senses aspiration (moderate vs trace amounts). Intermittent penetration of honey, puree. Trained pt in supraglottic swallow maneuver with realtime video feedback. This manuever was effective for airway protection with honey and pureed consistencies, however pt had difficulty coordinating with thinner liquids. Mild-moderate pyriform residue with solids, which pt senses and clears with reflexive swallow or with self-initiated "hock" back into the oral cavity. Pharyngoesophageal phase notable for decreased amplitude of UES opening, secondary to decreased hyolaryngeal excursion. Recommend initiating dys 3 with honey thick liquids, medications crushed in puree. Pt to hold breath, swallow, and cough immediately after swallow to improve airway protection. Endoscopy 09/23/17 revealed improved appearance from initial endoscopy 09/20/17, but noted mildly edematous arytenoids, boggy edema of vocal cords. I anticipate improvements as edema improves and with use of swallowing musculature. Will follow for tolerance, repeat instrumental,  advancement of liquids when appropriate.    Swallow Evaluation Recommendations       SLP Diet Recommendations: Dysphagia 3 (Mech soft) solids;Honey thick liquids   Liquid Administration via: Cup   Medication Administration: Crushed with puree   Supervision: Patient able to self feed;Intermittent supervision to cue for compensatory strategies   Compensations: Slow rate;Small sips/bites;Other (Comment)(hold breath, swallow, cough)       Oral Care Recommendations: Oral care BID   Other Recommendations: Order thickener from pharmacy;Clarify dietary restrictions;Remove water pitcher;Prohibited food (jello, ice cream, thin soups);Have oral suction available  Deneise Lever, [place], [place] Speech-Language Pathologist [phone]  Aliene Altes 09/25/2017,1:25 PM

## 2017-09-25 NOTE — Progress Notes (Signed)
Patient ID: [name], female   DOB: 1960/10/13, 56 y.o.   MRN: [id number] Seen on rounds this morning.  She feels much better today than yesterday.  Her breathing is quiet and clear and she has no complaints.  She is swallowing a little bit better.  She has a swallowing evaluation later today.  Continue to follow.

## 2017-09-26 ENCOUNTER — Encounter (HOSPITAL_COMMUNITY): Payer: Self-pay | Admitting: Otolaryngology

## 2017-09-26 DIAGNOSIS — D869 Sarcoidosis, unspecified: Secondary | ICD-10-CM

## 2017-09-26 LAB — GLUCOSE, CAPILLARY
Glucose-Capillary: 115 mg/dL — ABNORMAL HIGH (ref 65–99)
Glucose-Capillary: 117 mg/dL — ABNORMAL HIGH (ref 65–99)
Glucose-Capillary: 152 mg/dL — ABNORMAL HIGH (ref 65–99)
Glucose-Capillary: 167 mg/dL — ABNORMAL HIGH (ref 65–99)
Glucose-Capillary: 185 mg/dL — ABNORMAL HIGH (ref 65–99)

## 2017-09-26 MED ORDER — SULFAMETHOXAZOLE-TRIMETHOPRIM 800-160 MG PO TABS: 1.0000 | ORAL_TABLET | Freq: Two times a day (BID) | ORAL | 0 refills | Status: AC

## 2017-09-26 MED ORDER — ORAL CARE MOUTH RINSE
15.0000 mL | Freq: Two times a day (BID) | OROMUCOSAL | Status: DC
  Administered 2017-09-26: 15 mL via OROMUCOSAL

## 2017-09-26 NOTE — Progress Notes (Signed)
Was able to speak Dr. Blenda Nicely. Per ENT, office will contact pt after d/c to set up outpatient ST and modified barium swallow, pt may be discharged

## 2017-09-26 NOTE — Progress Notes (Addendum)
Pt has d/c orders, however, per ST pt needs outpatient modified barium study and ST follow up in one week. Do not see these in d/c instructions/has not been set up yet.  Attempting to get in touch w/ ENT/attending MD for orders. Number unavailable for Dr. Blenda Nicely, no number listed for NP/no note from NP.  Have attempted to get through to office 3x w/out success.  Paged ST. Awaiting call back

## 2017-09-26 NOTE — Progress Notes (Signed)
  Speech Language Pathology Treatment: Dysphagia  Patient Details Name: [name] MRN: [id number] DOB: [birth year] Today's Date: 09/26/2017 Time: 4098-1191 SLP Time Calculation (min) (ACUTE ONLY): 29 min  Assessment / Plan / Recommendation Clinical Impression  Pt recalled results and recommendations from MBS and utilized swallowing strategies during intake of honey thick liquids with no additional cueing needed. She did not have overt signs of aspiration, but aspiration on MBS on previous date was inconsistently sensed/silent. Pt voiced her concern about current diet textures, so SLP provided education and reviewed menu so that pt could try to find more options to her liking. [name] eager for retesting and believes that her voice is nearing her baseline. Given her level of dysphagia on MBS just yesterday I think that a repeat MBS today would be premature. However, she may be ready in the next 24-48 hours, and I definitely want to repeat her testing prior to d/c given the likely acute, reversible nature of her dysphagia from her supraglottitis and intubation.    HPI HPI: 56 yo female presented with difficulty swallowing, stridor and chest pain x 2 days.  In ED, she was seen by ENT and had transnasal flexible laryngoscopy that demonstrated an erythematous and edematous epiglottis with small blister, very edematous arytenoids and false cords. Findings c/w supraglottitis. She was subsequently taken to OR where she had an awake fiberoptic nasal intubation for airway protection. PMHx of Sarcoidosis with ocular involvement on 5 mg prednisone daily, Chronic bronchitis, HTN, HLD, Gout, GERD, DM, Depression, Diastolic CHF.      SLP Plan  Continue with current plan of care       Recommendations  Diet recommendations: Dysphagia 3 (mechanical soft);Honey-thick liquid Liquids provided via: Cup Medication Administration: Crushed with puree Supervision: Patient able to self feed;Intermittent  supervision to cue for compensatory strategies Compensations: Slow rate;Small sips/bites Postural Changes and/or Swallow Maneuvers: Seated upright 90 degrees;Hold breath during swallow, followed by a cough (Super supraglottic swallow)                Oral Care Recommendations: Oral care BID Follow up Recommendations: Outpatient SLP(if d/c on dysphagia diet) SLP Visit Diagnosis: Dysphagia, pharyngeal phase (R13.13);Dysphagia, pharyngoesophageal phase (R13.14) Plan: Continue with current plan of care       GO                [name] 09/26/2017, 10:07 AM  [name], M.A. CCC-SLP [phone]

## 2017-09-26 NOTE — Discharge Summary (Addendum)
Discharge Summary  Patient Details  Name: [name] MRN: [id number] DOB: 07-24-1961  DISCHARGE SUMMARY    Dates of Hospitalization: 09/20/2017 to 09/26/2017  Reason for Hospitalization: supraglottitis with airway edema and airway compromise  Problem List: Active Problems:   Acute hypoxemic respiratory failure ([place])   Dysphagia   Epiglottitis   Difficult airway for intubation   Final Diagnoses: supraglottitis  Brief Hospital Course:  Patient presented in respiratory distress on 09/20/17. WBC was 21. Patient exhibited biphasic stridor and tripoding. In the ED, a transnasal laryngoscopy was performed, revealing acute epiglottitis and supraglottitis requiring immediate intubation. Patient was taken emergently to the operating room for awake fiberoptic intubation. This was a very difficulty nasal fiberoptic intubation due to edema, but a 6.5 ETT was placed in the right nose and secured with a suture. She remained intubated until 09/23/17 when she was taken back to the operating room and extubated under TFl examination over a Cook catheter. She did well after extubation and was transferred to a step down unit on 12/15. She completed 6.5 days of IV vancomycin and IV rocephin. SLP evaluated and recommended thickened diet. Patient WBC normalized quickly, along with her symptoms. She was discharged on 09/26/17.   Discharge Weight: 110.3 kg (243 lb 2.7 oz)   Discharge Condition: Improved  Discharge Diet: thickened liquid  Discharge Activity: Ad lib   Procedures/Operations: Awake fiberoptic intubation on 12/11 and extubation on 12/14 in the OR Consultants: Critical care  Discharge Medication List  Allergies as of 09/26/2017      Reactions   Methotrexate Other (See Comments)   Peri-oral and buccal lesions   Vancomycin Other (See Comments)   Nephrotoxicity   Clindamycin/lincomycin Nausea And Vomiting, Rash   Chlorhexidine Itching   Doxycycline Rash   Lisinopril Cough   Teflaro  [ceftaroline] Rash   Tolerates ceftriaxone       Medication List    STOP taking these medications   HYDROcodone-acetaminophen 5-325 MG tablet Commonly known as:  NORCO/VICODIN     TAKE these medications   albuterol 108 (90 Base) MCG/ACT inhaler Commonly known as:  PROVENTIL HFA;VENTOLIN HFA Inhale 1-2 puffs into the lungs every 6 (six) hours as needed for wheezing or shortness of breath.   allopurinol 100 MG tablet Commonly known as:  ZYLOPRIM Take 100 mg by mouth at bedtime.   aspirin EC 81 MG tablet Take 81 mg by mouth daily.   atorvastatin 40 MG tablet Commonly known as:  LIPITOR TAKE 1 TAB BY MOUTH ONCE DAILY What changed:  See the new instructions.   BAYER CONTOUR MONITOR w/Device Kit Use daily to check sugar   BAYER MICROLET LANCETS lancets Use as instructed to check sugar 3 times daily   benzonatate 200 MG capsule Commonly known as:  TESSALON Take 200 mg by mouth 3 (three) times daily as needed for cough.   BREO ELLIPTA 200-25 MCG/INH Aepb Generic drug:  fluticasone furoate-vilanterol Inhale 1 puff into the lungs daily.   BRISDELLE 7.5 MG Caps Generic drug:  PARoxetine Mesylate Take 7.5 mg by mouth daily.   buPROPion 300 MG 24 hr tablet Commonly known as:  WELLBUTRIN XL TAKE 1 TABLET (300 MG TOTAL) BY MOUTH DAILY.   Calcium Citrate 200 MG Tabs Take 200 mg by mouth daily.   carvedilol 25 MG tablet Commonly known as:  COREG Take 1 tablet (25 mg total) by mouth 2 (two) times daily with a meal.   chlorpheniramine-HYDROcodone 10-8 MG/5ML Suer Commonly known as:  TUSSIONEX Take 5  mLs by mouth every 12 (twelve) hours as needed for cough.   dexlansoprazole 60 MG capsule Commonly known as:  DEXILANT Take 1 capsule (60 mg total) by mouth daily.   diclofenac sodium 1 % Gel Commonly known as:  VOLTAREN APPLY 2 GRAMS TOPICALLY 4 TIMES A DAY What changed:  See the new instructions.   diltiazem 120 MG 24 hr capsule Commonly known as:  CARDIZEM CD Take 1  capsule (120 mg total) by mouth daily.   fenofibrate 145 MG tablet Commonly known as:  TRICOR TAKE 1 TABLET BY MOUTH DAILY What changed:    how much to take  how to take this  when to take this   fluticasone 50 MCG/ACT nasal spray Commonly known as:  FLONASE Place 2 sprays into both nostrils daily.   furosemide 40 MG tablet Commonly known as:  LASIX Take 1 tablet (40 mg total) by mouth 2 (two) times daily. What changed:    how much to take  how to take this  when to take this   gabapentin 100 MG capsule Commonly known as:  NEURONTIN Take 1 capsule (100 mg total) by mouth 3 (three) times daily.   glucose blood test strip Commonly known as:  BAYER CONTOUR TEST Use as instructed to check sugar 3 times daily   insulin aspart 100 UNIT/ML FlexPen Commonly known as:  NOVOLOG FLEXPEN Inject 25-30 units in the skin 3 times daily with meals   Insulin Glargine 300 UNIT/ML Sopn Commonly known as:  TOUJEO SOLOSTAR Inject 40 Units at bedtime into the skin.   Insulin Pen Needle 33G X 4 MM Misc 1 each by Does not apply route 4 (four) times daily.   LORazepam 0.5 MG tablet Commonly known as:  ATIVAN Take 0.5 mg by mouth every 12 (twelve) hours as needed for anxiety.   metFORMIN 1000 MG tablet Commonly known as:  GLUCOPHAGE Take 1 tablet (1,000 mg total) by mouth 2 (two) times daily with a meal.   nitroGLYCERIN 0.4 MG SL tablet Commonly known as:  NITROSTAT Place 1 tablet (0.4 mg total) under the tongue every 5 (five) minutes as needed for chest pain. Reported on 01/05/2016   ondansetron 4 MG disintegrating tablet Commonly known as:  ZOFRAN-ODT Take 4 mg by mouth every 8 (eight) hours as needed for nausea or vomiting.   oxyCODONE-acetaminophen 5-325 MG tablet Commonly known as:  ROXICET Take 1 tablet by mouth every 4 (four) hours as needed for severe pain.   Potassium Chloride ER 20 MEQ Tbcr Take 1 tablet by mouth 3 (three) times daily. What changed:  how much to  take   predniSONE 5 MG tablet Commonly known as:  DELTASONE Take 5 mg by mouth daily with breakfast.   sulfamethoxazole-trimethoprim 800-160 MG tablet Commonly known as:  BACTRIM DS,SEPTRA DS Take 1 tablet by mouth 2 (two) times daily for 7 days.   traMADol 50 MG tablet Commonly known as:  ULTRAM Take 1 tablet (50 mg total) by mouth every 12 (twelve) hours as needed for moderate pain or severe pain.   triamcinolone cream 0.1 % Commonly known as:  KENALOG Apply 1 application topically daily as needed (for irritation).   venlafaxine XR 75 MG 24 hr capsule Commonly known as:  EFFEXOR-XR Take 2 capsules (150 mg total) by mouth at bedtime.   vitamin B-12 1000 MCG tablet Commonly known as:  CYANOCOBALAMIN Take 1,000 mcg by mouth daily.        Follow Up Issues/Recommendations: Will followup with  Dr. Blenda Nicely in 3-4 weeks Will followup with SLP in one week  Helayne Seminole 09/26/2017, 12:51 PM

## 2017-09-26 NOTE — Progress Notes (Signed)
Patient was in her room awake and alert. Patient had numerous stories to share about her faith background. Patient very cheerful.  No family present on-site but patient talked about good family dynamics and family support. Chaplain provided emotional support , reflective listening and compassionate presence.   [place].

## 2017-09-26 NOTE — Progress Notes (Signed)
CRITICAL CARE PROGRESS NOTE  PCCM Progress Note  Admission date: 09/20/2017 Consult date: 09/20/2017 Referring provider: Dr. Argentina Donovan, ENT    Patient Summary:     CC: odynophagia  Brief description: 56 yo female presented with difficulty swallowing, stridor and chest pain x 2 days.  Nasally intubated.  Found to have supraglottitis.  She is followed in pulmonary office by Dr. Vaughan Browner and Dr. Casper Harrison at [facility].  PMHx of Sarcoidosis with ocular involvement on 5 mg prednisone daily, Chronic bronchitis, HTN, HLD, Gout, GERD, DM, Depression, Diastolic CHF.  Subjective: No events overnight, feels better this AM post extubation  PHYSICAL EXAM:   General: NAD, morbidly obese Blood pressure (!) 150/69, pulse 74, temperature 98.2 F (36.8 C), temperature source Oral, resp. rate 17, weight 243 lb 6.2 oz (110.4 kg), SpO2 94 %. Eyes: PERRL and EOM-I Neck: Supple, no stridor [name]:  RRR, Nl S1/S2 and -M/R/G Resp: CTA bilaterally Abdomen: Soft, NT, ND and +BS Extremities: -edema and -tenderness Neuro: Awake and interactive, moving all ext to command Tone: Normal Skin: No rash Integumentary: No clubbing    Current Facility-Administered Medications:  .  albuterol (PROVENTIL) (2.5 MG/3ML) 0.083% nebulizer solution 2.5 mg, 2.5 mg, Nebulization, Q3H PRN, Desai, Rahul P, PA-C .  arformoterol (BROVANA) nebulizer solution 15 mcg, 15 mcg, Nebulization, BID, Desai, Rahul P, PA-C, 15 mcg at 09/26/17 1275 .  budesonide (PULMICORT) nebulizer solution 0.5 mg, 0.5 mg, Nebulization, BID, Desai, Rahul P, PA-C, 0.5 mg at 09/26/17 1700 .  cefTRIAXone (ROCEPHIN) 1 g in dextrose 5 % 50 mL IVPB, 1 g, Intravenous, Q24H, Reginia Naas, RPH, Stopped at 09/25/17 1857 .  heparin injection 5,000 Units, 5,000 Units, Subcutaneous, Q8H, Desai, Rahul P, PA-C, 5,000 Units at 09/26/17 0507 .  insulin aspart (novoLOG) injection 0-20 Units, 0-20 Units, Subcutaneous, Q4H, Desai, Rahul P, PA-C, 4 Units at 09/25/17 2041 .   insulin glargine (LANTUS) injection 10 Units, 10 Units, Subcutaneous, BID, Sood, Vineet, MD, 10 Units at 09/26/17 1110 .  ketorolac (TORADOL) 15 MG/ML injection 15 mg, 15 mg, Intravenous, Q8H, Deterding, Guadelupe Sabin, MD, 15 mg at 09/26/17 0507 .  LORazepam (ATIVAN) injection 0.25 mg, 0.25 mg, Intravenous, Q4H PRN, Helayne Seminole, MD, 0.25 mg at 09/25/17 2302 .  MEDLINE mouth rinse, 15 mL, Mouth Rinse, BID, Marcellino, San Jetty, MD, 15 mL at 09/26/17 1123 .  methylPREDNISolone sodium succinate (SOLU-MEDROL) 40 mg/mL injection 10 mg, 10 mg, Intravenous, Daily, Halford Chessman, Vineet, MD, 10 mg at 09/26/17 1113 .  pantoprazole (PROTONIX) injection 40 mg, 40 mg, Intravenous, Daily, Desai, Rahul P, PA-C, 40 mg at 09/26/17 1116 .  RESOURCE THICKENUP CLEAR, , Oral, PRN, Helayne Seminole, MD .  vancomycin (VANCOCIN) IVPB 1000 mg/200 mL premix, 1,000 mg, Intravenous, Q12H, Mancheril, Darnell Level, RPH, Last Rate: 200 mL/hr at 09/26/17 1122, 1,000 mg at 09/26/17 1122  LABS:  Results for KENLEIGH, TOBACK (MRN [id number]) as of 09/24/2017 12:41  Ref. Range 09/24/2017 03:56  Sodium Latest Ref Range: 135 - 145 mmol/L 142  Potassium Latest Ref Range: 3.5 - 5.1 mmol/L 3.0 (L)  Chloride Latest Ref Range: 101 - 111 mmol/L 106  CO2 Latest Ref Range: 22 - 32 mmol/L 26  Glucose Latest Ref Range: 65 - 99 mg/dL 125 (H)  BUN Latest Ref Range: 6 - 20 mg/dL 15  Creatinine Latest Ref Range: 0.44 - 1.00 mg/dL 0.64  Calcium Latest Ref Range: 8.9 - 10.3 mg/dL 8.8 (L)  Anion gap Latest Ref Range: 5 - 15  10  Results for ABIGAIL, TEALL (MRN [id number]) as of 09/24/2017 12:41  Ref. Range 09/24/2017 03:56  WBC Latest Ref Range: 4.0 - 10.5 K/uL 10.1  RBC Latest Ref Range: 3.87 - 5.11 MIL/uL 3.67 (L)  Hemoglobin Latest Ref Range: 12.0 - 15.0 g/dL 9.5 (L)  HCT Latest Ref Range: 36.0 - 46.0 % 31.7 (L)  MCV Latest Ref Range: 78.0 - 100.0 fL 86.4  MCH Latest Ref Range: 26.0 - 34.0 pg 25.9 (L)  MCHC Latest Ref Range: 30.0 -  36.0 g/dL 30.0  RDW Latest Ref Range: 11.5 - 15.5 % 15.3  Platelets Latest Ref Range: 150 - 400 K/uL 326   CHEST X-RAY:  09/24/17 Clear lungs    Ekg-12/12-- sinus- no acute changes    12/11- ct neck inferior pharyngeal soft tissue thickening with involvement of the epiglottis and supraglottic larynx, likely infectious. Correlate with direct visualization and clinical follow-up to exclude neoplasm. No abscess.  I reviewed CXR myself, no acute disease noted  Assessment/plan:  S/p Acute respiratory failure from epiglottis/supraglottis. - Continue prednisone for sarcoidosis and bronchitis at home dose - Brovana, pulmicort while hospitalized, upon discharge go back to home regiment. - Prednisone 5 mg PO daily (home dose).  DM type II.  - ISS - CBG - Upon discharge go back to home dose  Hx of HTN with chronic diastolic CHF, HLD. - Resume home meds upon discharge  Patient update bedside, ok to discharge.  Discussed with PCCM-NP  PCCM will sign off, please call back if needed.  Rush Farmer, M.D. [facility] Pulmonary/Critical Care Medicine. Pager: [phone]. After hours pager: [phone]

## 2017-09-26 NOTE — Progress Notes (Signed)
Reviewed AVS, meds, and d/c instructions w/ pt and pts son. Instructed pt that she also needs to f/u w/ her PCP whom pt states she will contact tomorrow.  Relayed that ENT outpatient will contact pt to set up outpatient modified barium swallow and ST follow. Pt states understanding, no questions or concerns. Pt d/c'd to home. Taken downstairs by wheelchair.

## 2017-09-26 NOTE — Progress Notes (Signed)
See note

## 2017-09-26 NOTE — Discharge Instructions (Signed)
-Take antibiotics until completion -Continue on your thickened diet as instructed  -See Speech and Language Pathologists as outpatient- we will schedule this for you.  -Dr. Trish Mage office will call you for an appointment in 3-4 weeks -See you regular doctor in the next 2 weeks -Resume all of your home medications -Stay on a probiotic, such as a yogurt with active cultures, while taking your antibiotics to promote your gut health.  -Be sure to practice good hand hygiene and stay awake from sick contacts.     Dr. Henderson Cloud [facility] [place] Provider Office [phone]   Thickening Liquids for Dysphagia Diet If you are on the dysphagia diet, you may need to thicken drinks, soups, foods that melt at room temperature, and other liquids before you drink or eat them. Thickening liquids makes them easier to swallow. It also reduces the risk of liquid traveling to your lungs. To make a thickened liquid you will need to add a commercial thickening product or a soft food to the liquid until it reaches the consistency it needs to be. Your health care provider or dietitian will explain to you the consistency you need to aim for. Liquid consistencies include:  Thin. Thin liquids include most drinks (such as water, milk, tea, soda, juice, carbonated drinks), as well as ice cream, sherbet, sorbet, ice pops, and broth-based soups.  Nectar-like. Nectar-like liquids include maple syrup and creamy soup.  Honey-like. Honey-like liquids are made to be runny but are thick like honey. They cannot be sipped through a straw.  Spoon-thick. Spoon-thick liquids are thick, like pudding.  My plan I should thicken my liquids to a _______________ consistency. Diet guidelines  Thicken liquids to the consistency your health care provider recommends.  Follow your dietitian's or health care provider's recommendation on how to thicken your  liquids.  See your dietitian or health care provider regularly for help with your dietary changes. How can I thicken my liquids? Liquids can be thickened with a commercial food and beverage thickener or with a soft food. [employer] Thickeners A food and beverage thickener is a powder or gel that makes a food or beverage thicker. Thickeners are sold at pharmacies, medical supply stores, some grocery stores, and online. They can be added to both hot and cold liquids and do not change the taste of the liquid. Ask your health care provider or dietitian for a complete list of commercial thickeners. Each thickening product is different. Some need to be blended into a liquid with a blender while others can be stirred into a liquid with a fork or spoon. Follow the instructions on the product label. Soft Foods Some foods such as soups, casseroles, and gravies can be thickened with soft foods. Soft foods include:  Baby cereal.  Gravy powder.  Mashed potato.  Pureed baby food.  Instant potato flakes.  Powdered sauce mixes (such as cheese mixes).  Flour.  To use one of these soft food items, stir or mix them into the thin liquid until it reaches the desired thickness. Start with a small amount and adjust soft food and liquid as necessary. Note: Flour works best with warm liquids, such as broth. To thicken a liquid with flour, make a paste out of flour and water. Cook or warm your liquid and add the paste to it. Stir until the mixture thickens. What are some tips to make thickening liquids easier?  Take thickeners with you when  eating out or traveling.  If a liquid gets too thick, add more of the thinner liquid until the desired consistency is reached.  Consider purchasing pre-made thickened drinks.  Consider using a thickening product to make your own frozen desserts. This information is not intended to replace advice given to you by your health care provider. Make sure you  discuss any questions you have with your health care provider. Document Released: 03/28/2012 Document Revised: 03/04/2016 Document Reviewed: 09/10/2013 Elsevier Interactive Patient Education  2017 [employer].

## 2017-09-27 ENCOUNTER — Telehealth: Payer: Self-pay | Admitting: *Deleted

## 2017-09-27 ENCOUNTER — Telehealth: Payer: Self-pay | Admitting: Family Medicine

## 2017-09-27 NOTE — Telephone Encounter (Signed)
Per chart review: DISCHARGE SUMMARY    Dates of Hospitalization: 09/20/2017 to 09/26/2017  Reason for Hospitalization: supraglottitis with airway edema and airway compromise  Problem List: Active Problems:   Acute hypoxemic respiratory failure ([place])   Dysphagia   Epiglottitis   Difficult airway for intubation   Final Diagnoses: supraglottitis ___________________________________________________________________________________________ Per phone call: Transition Care Management Follow-up Telephone Call   Date discharged? 09/26/17    How have you been since you were released from the hospital? "better"   Do you understand why you were in the hospital? yes   Do you understand the discharge instructions? yes   Where were you discharged to? Home, patient states that she has been with family or her husband since dc.   Items Reviewed:  Medications reviewed: yes  Allergies reviewed: yes  Dietary changes reviewed: yes  Referrals reviewed: yes   Functional Questionnaire:   Activities of Daily Living (ADLs):   She states they are independent in the following: ambulation, bathing and hygiene, feeding, continence, grooming, toileting and dressing States they require assistance with the following: None   Any transportation issues/concerns?: no   Any patient concerns? Yes. Patient states that she was supposed to have a swallow study 1 week after discharge. She states that she was not called to schedule this. Upon review of the dc note I do see where she was instructed to complete SLP within one week however, I do not see an order for this. Patient states that she has been unable to tolerated thickened liquids. She has been drinking thin liquids but states that she has not been coughing or struggling. I asked patient to call office immediately if this changes.   Confirmed importance and date/time of follow-up visits scheduled yes  Provider Appointment booked with Dr  Yong Channel 10/03/17 11:30  Confirmed with patient if condition begins to worsen call PCP or go to the ER.  Patient was given the office number and encouraged to call back with question or concerns.  : yes

## 2017-09-27 NOTE — Anesthesia Postprocedure Evaluation (Signed)
Anesthesia Post Note  Patient: [name]  Procedure(s) Performed: AWAKE INTUBATION WITH ANESTHESIA WITH VIDEO ASSISTANCE (N/A Nose)     Patient location during evaluation: ICU Anesthesia Type: General Level of consciousness: sedated and patient remains intubated per anesthesia plan Pain management: pain level controlled Vital Signs Assessment: post-procedure vital signs reviewed and stable Respiratory status: patient on ventilator - see flowsheet for VS and patient remains intubated per anesthesia plan Cardiovascular status: blood pressure returned to baseline Anesthetic complications: no    Last Vitals:  Vitals:   09/26/17 1400 09/26/17 1539  BP: (!) 144/66 140/80  Pulse: 78 83  Resp: 18 (!) 24  Temp:    SpO2: 99% 100%    Last Pain:  Vitals:   09/26/17 1131  TempSrc: Oral  PainSc:                  [name]

## 2017-09-27 NOTE — Telephone Encounter (Signed)
Please advise as needed.  Copied from [place] [id number]. Topic: General - Other >> Sep 27, 2017  3:04 PM Bea Graff, NT wrote: Reason for CRM: Pt returning a call to the office. Please call pt

## 2017-09-27 NOTE — Telephone Encounter (Signed)
Called patient for hospital follow up call. Patient states that she is "feeling better" and verified her 12//2 appointment with Dr Yong Channel before her phone lost service. I attempted to call her again and left a voicemail requesting a call back to finish call.

## 2017-09-28 NOTE — Telephone Encounter (Signed)
Patient was returning call for a TCM call that was ultimately completed.

## 2017-09-29 ENCOUNTER — Other Ambulatory Visit (HOSPITAL_COMMUNITY): Payer: Self-pay | Admitting: Otolaryngology

## 2017-09-29 DIAGNOSIS — R1310 Dysphagia, unspecified: Secondary | ICD-10-CM

## 2017-10-03 ENCOUNTER — Ambulatory Visit (INDEPENDENT_AMBULATORY_CARE_PROVIDER_SITE_OTHER): Payer: BLUE CROSS/BLUE SHIELD | Admitting: Family Medicine

## 2017-10-03 ENCOUNTER — Encounter: Payer: Self-pay | Admitting: Family Medicine

## 2017-10-03 VITALS — BP 122/62 | HR 80 | Temp 98.3°F | Ht 66.0 in | Wt 241.4 lb

## 2017-10-03 DIAGNOSIS — N183 Chronic kidney disease, stage 3 unspecified: Secondary | ICD-10-CM

## 2017-10-03 DIAGNOSIS — I10 Essential (primary) hypertension: Secondary | ICD-10-CM

## 2017-10-03 DIAGNOSIS — R1310 Dysphagia, unspecified: Secondary | ICD-10-CM

## 2017-10-03 DIAGNOSIS — Z794 Long term (current) use of insulin: Secondary | ICD-10-CM | POA: Diagnosis not present

## 2017-10-03 DIAGNOSIS — J9601 Acute respiratory failure with hypoxia: Secondary | ICD-10-CM | POA: Diagnosis not present

## 2017-10-03 DIAGNOSIS — J0510 Acute epiglottitis without obstruction: Secondary | ICD-10-CM

## 2017-10-03 DIAGNOSIS — I110 Hypertensive heart disease with heart failure: Secondary | ICD-10-CM

## 2017-10-03 DIAGNOSIS — E1129 Type 2 diabetes mellitus with other diabetic kidney complication: Secondary | ICD-10-CM | POA: Diagnosis not present

## 2017-10-03 DIAGNOSIS — I5032 Chronic diastolic (congestive) heart failure: Secondary | ICD-10-CM

## 2017-10-03 MED ORDER — FLUCONAZOLE 150 MG PO TABS: 150.0000 mg | ORAL_TABLET | Freq: Once | ORAL | 0 refills | Status: AC

## 2017-10-03 NOTE — Assessment & Plan Note (Signed)
Much improved- on thickened diet and has SLP follow up within 2 weeks- possible could be advanced

## 2017-10-03 NOTE — Progress Notes (Addendum)
Subjective:  [name] is a 56 y.o. year old very pleasant female patient who presents for transitional care management and hospital follow up for supraglottits with irway edema and acute respiratory failure. Patient was hospitalized from 09/20/17 to 09/26/17. A TCM phone call was completed on 09/27/17. Medical complexity moderate   Patients symptoms dated back to at least Monday the 10th of December when she was seen by urgent care in [facility] and was told had sinus infection. She was placed on antibiotics but sore throat worsened and then she got to the point where she couldn't swallow water, liquid tylenol, or take her medications. [place]   Fortunately, she sought care immediately because when she was seen in the ED she was found to have white count of 21k, biphasic stridor and tripoding. Emergent ENT evaluation revealed acute epiglottitis and supraglottitis requiring immediate intubation. Direct visualization showed worse symptoms then Ct which was done at time of arrival to ED. My personal review of the CT soft tissues of neck on 09/20/17 shows  Enlargement of epiglottis and supraglottic tissues, clear lung apices. She required fiberoptic intubation through right nare with 6.5 ETT with securing this with suture. She was intubated for 3 days and then extubated in ER under TFI examination over cook catheter. Did well post extubation and moved to step down unit. She was treated with 6.5 days total of vancomycin and rocephin and was discharged on 7 days of bactrim BID. Her WBC normalized. Review of labs shows slight hypokalemia before discharge. She was placed on thickened diet and has planned Swallowing test on 10/12/16. And seesENT that day   She feels much better. Denies breathing difficulty or sore throat. No trouble swallowing current diet but Taking time to eat- no trouble with that. She is thankful she sought care when she did and understands  this could have led to the end of her life. There were times where tracheostomy was considered apparently and she is thrilled she didnt need this. She was continued on steroids in hospital and discharged on her home prednisone at discharge. Diabetes controlled in hospital with sliding scale insulin for most part. She was retumed on home meds at discharge and noted today controlled BP, no recent weight gain, no edema (HF appears to be in good condition)  They also ask if any follow up needed after her being taken off losartan next month- BP is controlled without it.      See problem oriented charting as well ROS- no fever, chills, nausea. No trouble swallowing pills, food, or liquids.    Past Medical History-  Patient Active Problem List   Diagnosis Date Noted  . Obstructive chronic bronchitis without exacerbation ([place]) 09/18/2013    Priority: High  . Chest pain 04/11/2013    Priority: High  . Hypertensive heart disease with chronic diastolic congestive heart failure ([place])     Priority: High  . Sarcoidosis of lung ([place]) 04/10/2007    Priority: High  . Type II diabetes mellitus with renal manifestations ([place]) 08/21/2006    Priority: High  . Epiglottitis     Priority: Medium  . Osteoarthritis of right knee 01/26/2017    Priority: Medium  . CKD (chronic kidney disease) stage 3, GFR 30-59 ml/min (HCC) 04/27/2015    Priority: Medium  . Fatty liver 09/30/2014    Priority: Medium  . Depression     Priority: Medium  . Gout 08/20/2010  Priority: Medium  . Anemia 09/18/2009    Priority: Medium  . Sleep apnea 04/21/2009    Priority: Medium  . Hyperlipidemia 08/21/2006    Priority: Medium  . Essential hypertension 08/21/2006    Priority: Medium  . Dysphagia 09/20/2017    Priority: Low  . Plantar fasciitis, right 07/13/2017    Priority: Low  . Osteoarthrosis, localized, primary, knee, right     Priority: Low  . Sprain of calcaneofibular ligament of right ankle 06/06/2017     Priority: Low  . Onychomycosis 10/27/2015    Priority: Low  . MRSA (methicillin resistant staph aureus) culture positive 03/27/2015    Priority: Low  . Hot flashes 07/15/2014    Priority: Low  . Abnormal SPEP 04/17/2014    Priority: Low  . Fracture of left leg 04/17/2014    Priority: Low  . Cushingoid side effect of steroids ([place]) 04/17/2014    Priority: Low  . Internal hemorrhoids     Priority: Low  . Preoperative clearance 03/25/2014    Priority: Low  . Solitary pulmonary nodule, on CT 02/2013 - stable over 2 years in 2015 02/20/2013    Priority: Low  . Morbid obesity ([place]) 06/04/2009    Priority: Low  . GERD 08/21/2006    Priority: Low  . S/P total knee arthroplasty, right 06/29/2017  . Multiple allergies 10/14/2016  . Osteomyelitis of left foot ([place]) 05/29/2015  . Wound infection complicating hardware ([place]) 03/27/2015    Medications- reviewed and updated  A medical reconciliation was performed comparing current medicines to hospital discharge medications. Current Outpatient Medications  Medication Sig Dispense Refill  . albuterol (PROVENTIL HFA;VENTOLIN HFA) 108 (90 Base) MCG/ACT inhaler Inhale 1-2 puffs into the lungs every 6 (six) hours as needed for wheezing or shortness of breath. 8 g 5  . allopurinol (ZYLOPRIM) 100 MG tablet Take 100 mg by mouth at bedtime.     [name] aspirin EC 81 MG tablet Take 81 mg by mouth daily.     [name] atorvastatin (LIPITOR) 40 MG tablet TAKE 1 TAB BY MOUTH ONCE DAILY (Patient taking differently: Take 40 mg by mouth once a day) 90 tablet 3  . BAYER MICROLET LANCETS lancets Use as instructed to check sugar 3 times daily 300 each 5  . benzonatate (TESSALON) 200 MG capsule Take 200 mg by mouth 3 (three) times daily as needed for cough.     . Blood Glucose Monitoring Suppl (BAYER CONTOUR MONITOR) w/Device KIT Use daily to check sugar 1 kit 0  . buPROPion (WELLBUTRIN XL) 300 MG 24 hr tablet TAKE 1 TABLET (300 MG TOTAL) BY MOUTH DAILY. 90 tablet 3  .  Calcium Citrate 200 MG TABS Take 200 mg by mouth daily.    . carvedilol (COREG) 25 MG tablet Take 1 tablet (25 mg total) by mouth 2 (two) times daily with a meal. 180 tablet 3  . chlorpheniramine-HYDROcodone (TUSSIONEX) 10-8 MG/5ML SUER Take 5 mLs by mouth every 12 (twelve) hours as needed for cough.  0  . dexlansoprazole (DEXILANT) 60 MG capsule Take 1 capsule (60 mg total) by mouth daily. 30 capsule 1  . diclofenac sodium (VOLTAREN) 1 % GEL APPLY 2 GRAMS TOPICALLY 4 TIMES A DAY (Patient taking differently: APPLY 2 GRAMS TOPICALLY 4 TIMES A DAY AS NEEDED FOR KNEE PAIN) 100 g 0  . diltiazem (CARDIZEM CD) 120 MG 24 hr capsule Take 1 capsule (120 mg total) by mouth daily. 90 capsule 3  . fenofibrate (TRICOR) 145 MG tablet TAKE 1  TABLET BY MOUTH DAILY (Patient taking differently: Take 145 mg by mouth once a day) 30 tablet 4  . fluticasone (FLONASE) 50 MCG/ACT nasal spray Place 2 sprays into both nostrils daily. 48 g 3  . fluticasone furoate-vilanterol (BREO ELLIPTA) 200-25 MCG/INH AEPB Inhale 1 puff into the lungs daily.    . furosemide (LASIX) 40 MG tablet Take 1 tablet (40 mg total) by mouth 2 (two) times daily. 180 tablet 1  . gabapentin (NEURONTIN) 100 MG capsule Take 1 capsule (100 mg total) by mouth 3 (three) times daily. 90 capsule 0  . glucose blood (BAYER CONTOUR TEST) test strip Use as instructed to check sugar 3 times daily 300 each 5  . insulin aspart (NOVOLOG FLEXPEN) 100 UNIT/ML FlexPen Inject 25-30 units in the skin 3 times daily with meals 30 mL 5  . Insulin Glargine (TOUJEO SOLOSTAR) 300 UNIT/ML SOPN Inject 40 Units at bedtime into the skin. 5 pen 11  . Insulin Pen Needle 33G X 4 MM MISC 1 each by Does not apply route 4 (four) times daily. 200 each 11  . LORazepam (ATIVAN) 0.5 MG tablet Take 0.5 mg by mouth every 12 (twelve) hours as needed for anxiety.    . metFORMIN (GLUCOPHAGE) 1000 MG tablet Take 1 tablet (1,000 mg total) by mouth 2 (two) times daily with a meal. 180 tablet 0  .  nitroGLYCERIN (NITROSTAT) 0.4 MG SL tablet Place 1 tablet (0.4 mg total) under the tongue every 5 (five) minutes as needed for chest pain. Reported on 01/05/2016 100 tablet 0  . ondansetron (ZOFRAN-ODT) 4 MG disintegrating tablet Take 4 mg by mouth every 8 (eight) hours as needed for nausea or vomiting.     [name] oxyCODONE-acetaminophen (ROXICET) 5-325 MG tablet Take 1 tablet by mouth every 4 (four) hours as needed for severe pain. 60 tablet 0  . PARoxetine Mesylate (BRISDELLE) 7.5 MG CAPS Take 7.5 mg by mouth daily.    . Potassium Chloride ER 20 MEQ TBCR Take 1 tablet by mouth 3 (three) times daily. (Patient taking differently: Take 20 mEq by mouth 3 (three) times daily. ) 90 tablet 3  . predniSONE (DELTASONE) 5 MG tablet Take 5 mg by mouth daily with breakfast.    . sulfamethoxazole-trimethoprim (BACTRIM DS,SEPTRA DS) 800-160 MG tablet Take 1 tablet by mouth 2 (two) times daily for 7 days. 14 tablet 0  . traMADol (ULTRAM) 50 MG tablet Take 1 tablet (50 mg total) by mouth every 12 (twelve) hours as needed for moderate pain or severe pain. 30 tablet 2  . triamcinolone cream (KENALOG) 0.1 % Apply 1 application topically daily as needed (for irritation).     . venlafaxine XR (EFFEXOR-XR) 75 MG 24 hr capsule Take 2 capsules (150 mg total) by mouth at bedtime.    . vitamin B-12 (CYANOCOBALAMIN) 1000 MCG tablet Take 1,000 mcg by mouth daily.       Objective: BP 122/62 (BP Location: Left Arm, Patient Position: Sitting, Cuff Size: Large)   Pulse 80   Temp 98.3 F (36.8 C) (Oral)   Ht 5' 6"  (1.676 m)   Wt 241 lb 6.4 oz (109.5 kg)   SpO2 96%   BMI 38.96 kg/m  Gen: NAD, resting comfortably TM normal, oropharynx normal- epiglottis not visible, some inflammation right nare (where had ET tube), left nare normal CV: RRR no murmurs rubs or gallops Lungs: CTAB no crackles, wheeze, rhonchi Abdomen: soft/nontender/nondistended/normal bowel sounds.  Ext: trace edema Skin: warm,  dry  Assessment/Plan:   Dysphagia  Much improved- on thickened diet and has SLP follow up within 2 weeks- possible could be advanced  Epiglottitis Much improved. She is to finish bactrim course. Return precautions discussed  Acute hypoxemic respiratory failure ([place]) Now resolved after treatment of epiglottitis- will resolve issue  Hypertensive heart disease with chronic diastolic congestive heart failure (HCC) Appears euvolemic. May continue lasix 74m BID  Type II diabetes mellitus with renal manifestations (HCC) Off losartan due to chronic cough- will need to check micro albumin/creatinine ratio  CKD (chronic kidney disease) stage 3, GFR 30-59 ml/min (HCC) Will monitor renal function and microalbumin/creatinine ratio- may have to choose alternate arb than losartan that may have less cough effect  Essential hypertension Controlled on Cartia XT 1260m carvedilol 25 mg BID,  Lasix 4017mID  Also has some vaginal discharge- usually fails to respond to OTC- one time diflucan sent in. She will take this after she finishes the bactrim.   Future Appointments  Date Time Provider [place]2017  1:00 PM [facility] SPEECH THERAPIST [facility] None  10/12/2017  1:00 PM [facility] R/F 2 [facility] [place]  11/30/2017  8:00 AM EllRenato ShinD [facility] None   No planned follow up scheduled. Usually we see each other at least every 3-4 months  Orders Placed This Encounter  Procedures  . Microalbumin / creatinine urine ratio    [place]    Standing Status:   Future    Number of Occurrences:   1    Standing Expiration Date:   10/03/2018  . CBC with Differential/Platelet    Standing Status:   Future    Number of Occurrences:   1    Standing Expiration Date:   10/03/2018  . Basic metabolic panel    Standing Status:   Future    Number of Occurrences:   1    Standing Expiration Date:   10/03/2018   Essential hypertension - Plan: CBC with Differential/Platelet, Basic metabolic  panel, Basic metabolic panel, CBC with Differential/Platelet, CANCELED: Basic metabolic panel, CANCELED: CBC with Differential/Platelet, CANCELED: Basic metabolic panel, CANCELED: CBC with Differential/Platelet  Type 2 diabetes mellitus with other diabetic kidney complication, with long-term current use of insulin ([place] Plan: Microalbumin / creatinine urine ratio, CBC with Differential/Platelet, Basic metabolic panel, Basic metabolic panel, CBC with Differential/Platelet, Microalbumin / creatinine urine ratio, CANCELED: Basic metabolic panel, CANCELED: CBC with Differential/Platelet, CANCELED: Microalbumin / creatinine urine ratio  Dysphagia, unspecified type  Epiglottitis  Acute hypoxemic respiratory failure (HCC)  Hypertensive heart disease with chronic diastolic congestive heart failure (HCC)  CKD (chronic kidney disease) stage 3, GFR 30-59 ml/min (HCC)   Return precautions advised.  SteGarret ReddishD

## 2017-10-03 NOTE — Assessment & Plan Note (Signed)
Now resolved after treatment of epiglottitis- will resolve issue

## 2017-10-03 NOTE — Assessment & Plan Note (Signed)
Appears euvolemic. May continue lasix 40mg  BID

## 2017-10-03 NOTE — Assessment & Plan Note (Signed)
Controlled on Cartia XT 120mg , carvedilol 25 mg BID,  Lasix 40mg  BID

## 2017-10-03 NOTE — Patient Instructions (Addendum)
Please stop by lab before you go  No chnages today- finish the antibiotic

## 2017-10-03 NOTE — Assessment & Plan Note (Signed)
Off losartan due to chronic cough- will need to check micro albumin/creatinine ratio

## 2017-10-03 NOTE — Assessment & Plan Note (Signed)
Will monitor renal function and microalbumin/creatinine ratio- may have to choose alternate arb than losartan that may have less cough effect

## 2017-10-03 NOTE — Addendum Note (Signed)
Addended by: [name] on: 10/03/2017 10:01 PM   Modules accepted: Orders

## 2017-10-03 NOTE — Assessment & Plan Note (Signed)
Much improved. She is to finish bactrim course. Return precautions discussed

## 2017-10-04 LAB — BASIC METABOLIC PANEL
BUN: 11 mg/dL (ref 7–25)
CO2: 27 mmol/L (ref 20–32)
Calcium: 9 mg/dL (ref 8.6–10.4)
Chloride: 102 mmol/L (ref 98–110)
Creat: 0.98 mg/dL (ref 0.50–1.05)
Glucose, Bld: 179 mg/dL — ABNORMAL HIGH (ref 65–99)
Potassium: 4.6 mmol/L (ref 3.5–5.3)
Sodium: 139 mmol/L (ref 135–146)

## 2017-10-04 LAB — CBC WITH DIFFERENTIAL/PLATELET
Basophils Absolute: 97 cells/uL (ref 0–200)
Basophils Relative: 0.7 %
Eosinophils Absolute: 345 cells/uL (ref 15–500)
Eosinophils Relative: 2.5 %
HCT: 30.3 % — ABNORMAL LOW (ref 35.0–45.0)
Hemoglobin: 9.9 g/dL — ABNORMAL LOW (ref 11.7–15.5)
Lymphs Abs: 3064 cells/uL (ref 850–3900)
MCH: 26.8 pg — ABNORMAL LOW (ref 27.0–33.0)
MCHC: 32.7 g/dL (ref 32.0–36.0)
MCV: 82.1 fL (ref 80.0–100.0)
MPV: 10.2 fL (ref 7.5–12.5)
Monocytes Relative: 6.7 %
Neutro Abs: 9370 cells/uL — ABNORMAL HIGH (ref 1500–7800)
Neutrophils Relative %: 67.9 %
Platelets: 412 10*3/uL — ABNORMAL HIGH (ref 140–400)
RBC: 3.69 10*6/uL — ABNORMAL LOW (ref 3.80–5.10)
RDW: 15.2 % — ABNORMAL HIGH (ref 11.0–15.0)
Total Lymphocyte: 22.2 %
WBC mixed population: 925 cells/uL (ref 200–950)
WBC: 13.8 10*3/uL — ABNORMAL HIGH (ref 3.8–10.8)

## 2017-10-04 LAB — MICROALBUMIN / CREATININE URINE RATIO
Creatinine, Urine: 99 mg/dL (ref 20–275)
Microalb Creat Ratio: 9 mcg/mg creat (ref <30)
Microalb, Ur: 0.9 mg/dL

## 2017-10-07 ENCOUNTER — Other Ambulatory Visit: Payer: Self-pay | Admitting: Endocrinology

## 2017-10-08 ENCOUNTER — Other Ambulatory Visit: Payer: Self-pay | Admitting: Endocrinology

## 2017-10-12 ENCOUNTER — Ambulatory Visit (HOSPITAL_COMMUNITY)
Admission: RE | Admit: 2017-10-12 | Discharge: 2017-10-12 | Disposition: A | Discharge status: Home / Self Care | Payer: BLUE CROSS/BLUE SHIELD | Source: Ambulatory Visit | Attending: Otolaryngology | Admitting: Otolaryngology

## 2017-10-12 DIAGNOSIS — J0431 Supraglottitis, unspecified, with obstruction: Secondary | ICD-10-CM | POA: Diagnosis not present

## 2017-10-12 DIAGNOSIS — R1310 Dysphagia, unspecified: Secondary | ICD-10-CM | POA: Diagnosis not present

## 2017-10-12 DIAGNOSIS — R1312 Dysphagia, oropharyngeal phase: Secondary | ICD-10-CM | POA: Diagnosis not present

## 2017-10-12 NOTE — Addendum Note (Signed)
Addendum  created 10/12/17 1420 by Roberts Gaudy, MD   Sign clinical note

## 2017-10-17 ENCOUNTER — Ambulatory Visit (INDEPENDENT_AMBULATORY_CARE_PROVIDER_SITE_OTHER): Payer: BLUE CROSS/BLUE SHIELD | Admitting: Family Medicine

## 2017-10-17 ENCOUNTER — Encounter: Payer: Self-pay | Admitting: Family Medicine

## 2017-10-17 VITALS — BP 150/68 | HR 86 | Temp 98.1°F | Ht 66.0 in | Wt 246.2 lb

## 2017-10-17 DIAGNOSIS — I10 Essential (primary) hypertension: Secondary | ICD-10-CM

## 2017-10-17 DIAGNOSIS — D860 Sarcoidosis of lung: Secondary | ICD-10-CM | POA: Diagnosis not present

## 2017-10-17 DIAGNOSIS — J0100 Acute maxillary sinusitis, unspecified: Secondary | ICD-10-CM

## 2017-10-17 MED ORDER — AMOXICILLIN-POT CLAVULANATE 875-125 MG PO TABS: 1.0000 | ORAL_TABLET | Freq: Two times a day (BID) | ORAL | 0 refills | Status: AC

## 2017-10-17 NOTE — Patient Instructions (Signed)
You have had a rash to one of augmentin's cousin. Please let us know if you get a rash.   For sinus infection- 7 days of augmentin. See [place] back if not improving or if you have new or worsening symptoms

## 2017-10-17 NOTE — Progress Notes (Addendum)
PCP: [name], MD  Subjective:  [name] is a 57 y.o. year old very pleasant female patient who presents with sinusitis symptoms including nasal congestion, sinus tenderness -other symptoms include: yellow discharge from nares, mild headache, dry cough, sore throat. Fortunately no lung issues -day of illness:3 -Symptoms are worsening -previous treatments: rest, hydration. No OTC treatments -sick contacts/travel/risks: denies flu exposure.   She has been on several antibiiotics lately for various issues: levaquin 08/05/17 Azithromycin 08/15/17 cipro 08/15/17 Bactrim 09/26/17  ROS-denies fever, SOB, NVD, tooth pain  Pertinent Past Medical History-  Patient Active Problem List   Diagnosis Date Noted  . Obstructive chronic bronchitis without exacerbation ([place]) 09/18/2013    Priority: High  . Chest pain 04/11/2013    Priority: High  . Hypertensive heart disease with chronic diastolic congestive heart failure ([place])     Priority: High  . Sarcoidosis of lung ([place]) 04/10/2007    Priority: High  . Type II diabetes mellitus with renal manifestations ([place]) 08/21/2006    Priority: High  . Epiglottitis     Priority: Medium  . Osteoarthritis of right knee 01/26/2017    Priority: Medium  . CKD (chronic kidney disease) stage 3, GFR 30-59 ml/min (HCC) 04/27/2015    Priority: Medium  . Fatty liver 09/30/2014    Priority: Medium  . Depression     Priority: Medium  . Gout 08/20/2010    Priority: Medium  . Anemia 09/18/2009    Priority: Medium  . Sleep apnea 04/21/2009    Priority: Medium  . Hyperlipidemia 08/21/2006    Priority: Medium  . Essential hypertension 08/21/2006    Priority: Medium  . Dysphagia 09/20/2017    Priority: Low  . Plantar fasciitis, right 07/13/2017    Priority: Low  . Osteoarthrosis, localized, primary, knee, right     Priority: Low  . Sprain of calcaneofibular ligament of right ankle 06/06/2017    Priority: Low  . Onychomycosis 10/27/2015    Priority: Low  . MRSA (methicillin resistant staph aureus) culture positive 03/27/2015    Priority: Low  . Hot flashes 07/15/2014    Priority: Low  . Abnormal SPEP 04/17/2014    Priority: Low  . Fracture of left leg 04/17/2014    Priority: Low  . Cushingoid side effect of steroids ([place]) 04/17/2014    Priority: Low  . Internal hemorrhoids     Priority: Low  . Preoperative clearance 03/25/2014    Priority: Low  . Solitary pulmonary nodule, on CT 02/2013 - stable over 2 years in 2015 02/20/2013    Priority: Low  . Morbid obesity ([place]) 06/04/2009    Priority: Low  . GERD 08/21/2006    Priority: Low  . S/P total knee arthroplasty, right 06/29/2017  . Multiple allergies 10/14/2016  . Osteomyelitis of left foot ([place]) 05/29/2015  . Wound infection complicating hardware ([place]) 03/27/2015    Medications- reviewed  Current Outpatient Medications  Medication Sig Dispense Refill  . albuterol (PROVENTIL HFA;VENTOLIN HFA) 108 (90 Base) MCG/ACT inhaler Inhale 1-2 puffs into the lungs every 6 (six) hours as needed for wheezing or shortness of breath. 8 g 5  . allopurinol (ZYLOPRIM) 100 MG tablet Take 100 mg by mouth at bedtime.     [name] aspirin EC 81 MG tablet Take 81 mg by mouth daily.     [name] atorvastatin (LIPITOR) 40 MG tablet TAKE 1 TAB BY MOUTH ONCE DAILY (Patient taking differently: Take 40 mg by mouth once a day) 90 tablet 3  .  BAYER MICROLET LANCETS lancets Use as instructed to check sugar 3 times daily 300 each 5  . benzonatate (TESSALON) 200 MG capsule Take 200 mg by mouth 3 (three) times daily as needed for cough.     . Blood Glucose Monitoring Suppl (BAYER CONTOUR MONITOR) w/Device KIT Use daily to check sugar 1 kit 0  . buPROPion (WELLBUTRIN XL) 300 MG 24 hr tablet TAKE 1 TABLET (300 MG TOTAL) BY MOUTH DAILY. 90 tablet 3  . Calcium Citrate 200 MG TABS Take 200 mg by mouth daily.    . carvedilol (COREG) 25 MG tablet Take 1 tablet (25 mg total) by mouth 2 (two) times daily with a meal. 180  tablet 3  . chlorpheniramine-HYDROcodone (TUSSIONEX) 10-8 MG/5ML SUER Take 5 mLs by mouth every 12 (twelve) hours as needed for cough.  0  . dexlansoprazole (DEXILANT) 60 MG capsule Take 1 capsule (60 mg total) by mouth daily. 30 capsule 1  . diclofenac sodium (VOLTAREN) 1 % GEL APPLY 2 GRAMS TOPICALLY 4 TIMES A DAY (Patient taking differently: APPLY 2 GRAMS TOPICALLY 4 TIMES A DAY AS NEEDED FOR KNEE PAIN) 100 g 0  . diltiazem (CARDIZEM CD) 120 MG 24 hr capsule Take 1 capsule (120 mg total) by mouth daily. 90 capsule 3  . fenofibrate (TRICOR) 145 MG tablet TAKE 1 TABLET BY MOUTH DAILY (Patient taking differently: Take 145 mg by mouth once a day) 30 tablet 4  . fluticasone (FLONASE) 50 MCG/ACT nasal spray Place 2 sprays into both nostrils daily. 48 g 3  . fluticasone furoate-vilanterol (BREO ELLIPTA) 200-25 MCG/INH AEPB Inhale 1 puff into the lungs daily.    . furosemide (LASIX) 40 MG tablet Take 1 tablet (40 mg total) by mouth 2 (two) times daily. 180 tablet 1  . gabapentin (NEURONTIN) 100 MG capsule TAKE 1 CAPSULE BY MOUTH THREE TIMES A DAY 90 capsule 0  . gabapentin (NEURONTIN) 100 MG capsule TAKE 1 CAPSULE BY MOUTH THREE TIMES A DAY 90 capsule 0  . glucose blood (BAYER CONTOUR TEST) test strip Use as instructed to check sugar 3 times daily 300 each 5  . insulin aspart (NOVOLOG FLEXPEN) 100 UNIT/ML FlexPen Inject 25-30 units in the skin 3 times daily with meals 30 mL 5  . Insulin Glargine (TOUJEO SOLOSTAR) 300 UNIT/ML SOPN Inject 40 Units at bedtime into the skin. 5 pen 11  . Insulin Pen Needle 33G X 4 MM MISC 1 each by Does not apply route 4 (four) times daily. 200 each 11  . LORazepam (ATIVAN) 0.5 MG tablet Take 0.5 mg by mouth every 12 (twelve) hours as needed for anxiety.    . metFORMIN (GLUCOPHAGE) 1000 MG tablet Take 1 tablet (1,000 mg total) by mouth 2 (two) times daily with a meal. 180 tablet 0  . nitroGLYCERIN (NITROSTAT) 0.4 MG SL tablet Place 1 tablet (0.4 mg total) under the tongue  every 5 (five) minutes as needed for chest pain. Reported on 01/05/2016 100 tablet 0  . ondansetron (ZOFRAN-ODT) 4 MG disintegrating tablet Take 4 mg by mouth every 8 (eight) hours as needed for nausea or vomiting.     [name] oxyCODONE-acetaminophen (ROXICET) 5-325 MG tablet Take 1 tablet by mouth every 4 (four) hours as needed for severe pain. 60 tablet 0  . PARoxetine Mesylate (BRISDELLE) 7.5 MG CAPS Take 7.5 mg by mouth daily.    . Potassium Chloride ER 20 MEQ TBCR Take 1 tablet by mouth 3 (three) times daily. (Patient taking differently: Take 20 mEq by  mouth 3 (three) times daily. ) 90 tablet 3  . predniSONE (DELTASONE) 5 MG tablet Take 5 mg by mouth daily with breakfast.    . traMADol (ULTRAM) 50 MG tablet Take 1 tablet (50 mg total) by mouth every 12 (twelve) hours as needed for moderate pain or severe pain. 30 tablet 2  . triamcinolone cream (KENALOG) 0.1 % Apply 1 application topically daily as needed (for irritation).     . venlafaxine XR (EFFEXOR-XR) 75 MG 24 hr capsule Take 2 capsules (150 mg total) by mouth at bedtime.    . vitamin B-12 (CYANOCOBALAMIN) 1000 MCG tablet Take 1,000 mcg by mouth daily.    [name] amoxicillin-clavulanate (AUGMENTIN) 875-125 MG tablet Take 1 tablet by mouth 2 (two) times daily for 7 days. 14 tablet 0   No current facility-administered medications for this visit.     Objective: BP (!) 156/72 (BP Location: Left Arm, Patient Position: Sitting, Cuff Size: Large)   Pulse 86   Temp 98.1 F (36.7 C) (Oral)   Ht 5' 6" (1.676 m)   Wt 246 lb 3.2 oz (111.7 kg)   SpO2 96%   BMI 39.74 kg/m  Gen: NAD, resting comfortably HEENT: Turbinates erythematous with yellow drainage, TM normal, pharynx mildly erythematous with no tonsilar exudate or edema, both frontal and maxillary sinus tenderness CV: RRR no murmurs rubs or gallops Lungs: CTAB no crackles, wheeze, rhonchi Ext: no edema  Skin: warm, dry, no rash  Assessment/Plan:  Sinsusitis Patient is a high risk patient with  sarcoidosis of the lung, diabetes and very high risk of hospitalization. Also several antibiotic exposures recently as above. She does not meet criteria for bacterial sinusitis but given her high risk as well as recent severe illness ans hospitalization for epiglottitis- she strongly prefers to be started on antibiotics- we opted for augmentin for sinusitis. Has had to use a lot of prednisone in past plus weight is worsening issue so will hold off on that.   Also noted high BP BP Readings from Last 3 Encounters:  10/17/17 (!) 150/68  10/03/17 122/62  controlled at last visit- she is acutely ill- suspect this will trend down at next visit. No changes in medication  Finally, we reviewed reasons to return to care including if symptoms worsen or persist or new concerns arise (particularly fever or shortness of breath)  Meds ordered this encounter  Medications  . amoxicillin-clavulanate (AUGMENTIN) 875-125 MG tablet    Sig: Take 1 tablet by mouth 2 (two) times daily for 7 days.    Dispense:  14 tablet    Refill:  0    Garret Reddish, MD

## 2017-10-25 ENCOUNTER — Encounter (INDEPENDENT_AMBULATORY_CARE_PROVIDER_SITE_OTHER): Payer: Self-pay | Admitting: Orthopedic Surgery

## 2017-10-25 ENCOUNTER — Ambulatory Visit (INDEPENDENT_AMBULATORY_CARE_PROVIDER_SITE_OTHER): Payer: BLUE CROSS/BLUE SHIELD | Admitting: Orthopedic Surgery

## 2017-10-25 ENCOUNTER — Ambulatory Visit (INDEPENDENT_AMBULATORY_CARE_PROVIDER_SITE_OTHER): Payer: BLUE CROSS/BLUE SHIELD

## 2017-10-25 VITALS — Ht 66.0 in | Wt 246.0 lb

## 2017-10-25 DIAGNOSIS — M25561 Pain in right knee: Secondary | ICD-10-CM | POA: Diagnosis not present

## 2017-10-25 NOTE — Progress Notes (Signed)
Office Visit Note   Patient: [name]           Date of Birth: 04-Aug-1961           MRN: [id number] Visit Date: 10/25/2017              Requested by: [name], MD [place], [place] [address] PCP: [name], MD  Chief Complaint  Patient presents with  . Right Knee - Pain      HPI: Patient is a 57 year old woman who presents to status post a right total knee arthroplasty back in September.  Patient states that she works out 3 times a week and she fell a couple weeks ago concerned that she may have injured her total knee replacement.  Assessment & Plan: Visit Diagnoses:  1. Right knee pain, unspecified chronicity     Plan: Patient's knee is stable.  She will continue with strengthening exercises.  Follow-Up Instructions: Return if symptoms worsen or fail to improve.   Ortho Exam  Patient is alert, oriented, no adenopathy, well-dressed, normal affect, normal respiratory effort. Examination she has a normal gait there is no redness no effusion no abrasion.  Varus and valgus stress is stable she has full active extension with no extensor lag.  She has full flexion the patella tracks midline there is no extensor mechanism injury.  There is no effusion.  Imaging: Xr Knee 1-2 Views Right  Result Date: 10/25/2017 2 view radiographs of the right knee shows a stable total knee arthroplasty no complicating features no subsidence of the tibial or femoral components the joint space is congruent.  No images are attached to the encounter.  Labs: Lab Results  Component Value Date   HGBA1C 8.7 (H) 09/20/2017   HGBA1C 7.8 (H) 08/13/2017   HGBA1C 7.5 (H) 06/17/2017   ESRSEDRATE 28 06/16/2017   ESRSEDRATE 5 01/26/2017   ESRSEDRATE 27 10/14/2016   CRP 27.1 (H) 06/16/2017   CRP 17.3 (H) 01/26/2017   CRP 24.0 (H) 10/14/2016   LABURIC 5.1 01/26/2017   REPTSTATUS 09/22/2017 FINAL 09/20/2017   GRAMSTAIN  08/14/2017    MODERATE WBC PRESENT,  PREDOMINANTLY PMN MODERATE BUDDING YEAST SEEN    CULT NO GROUP A STREP (S.PYOGENES) ISOLATED 09/20/2017   LABORGA PSEUDOMONAS FLUORESCENS 08/14/2017    @LABSALLVALUES (HGBA1)@  Body mass index is 39.71 kg/m.  Orders:  Orders Placed This Encounter  Procedures  . XR Knee 1-2 Views Right   No orders of the defined types were placed in this encounter.    Procedures: No procedures performed  Clinical Data: No additional findings.  ROS:  All other systems negative, except as noted in the HPI. Review of Systems  Objective: Vital Signs: Ht 5\' 6"  (1.676 m)   Wt 246 lb (111.6 kg)   BMI 39.71 kg/m   Specialty Comments:  No specialty comments available.  PMFS History: Patient Active Problem List   Diagnosis Date Noted  . Dysphagia 09/20/2017  . Epiglottitis   . Plantar fasciitis, right 07/13/2017  . S/P total knee arthroplasty, right 06/29/2017  . Osteoarthrosis, localized, primary, knee, right   . Sprain of calcaneofibular ligament of right ankle 06/06/2017  . Osteoarthritis of right knee 01/26/2017  . Multiple allergies 10/14/2016  . Onychomycosis 10/27/2015  . Osteomyelitis of left foot ([place]) 05/29/2015  . CKD (chronic kidney disease) stage 3, GFR 30-59 ml/min (HCC) 04/27/2015  . MRSA (methicillin resistant staph aureus) culture positive 03/27/2015  . Wound infection complicating hardware ([place])  03/27/2015  . Fatty liver 09/30/2014  . Hot flashes 07/15/2014  . Abnormal SPEP 04/17/2014  . Fracture of left leg 04/17/2014  . Cushingoid side effect of steroids ([place]) 04/17/2014  . Internal hemorrhoids   . Depression   . Preoperative clearance 03/25/2014  . Obstructive chronic bronchitis without exacerbation ([place]) 09/18/2013  . Chest pain 04/11/2013  . Hypertensive heart disease with chronic diastolic congestive heart failure ([place])   . Solitary pulmonary nodule, on CT 02/2013 - stable over 2 years in 2015 02/20/2013  . Gout 08/20/2010  . Anemia 09/18/2009  . Morbid  obesity ([place]) 06/04/2009  . Sleep apnea 04/21/2009  . Sarcoidosis of lung ([place]) 04/10/2007  . Hyperlipidemia 08/21/2006  . Essential hypertension 08/21/2006  . GERD 08/21/2006  . Type II diabetes mellitus with renal manifestations ([place]) 08/21/2006   Past Medical History:  Diagnosis Date  . Abnormal SPEP 04/17/2014  . Acute left ankle pain 01/26/2017  . ANEMIA-UNSPECIFIED 09/18/2009  . CHF (congestive heart failure) ([place])   . Chronic diastolic heart failure, NYHA class 2 (HCC)    LVEDP roughly 20% by cath  . COPD (chronic obstructive pulmonary disease) ([place])   . Depression   . DIABETES MELLITUS, TYPE II 08/21/2006  . Diabetic osteomyelitis ([place]) 05/29/2015  . Fracture of 5th metatarsal    non union  . GERD 08/21/2006  . GOUT 08/20/2010  . Hx of umbilical hernia repair   . HYPERLIPIDEMIA 08/21/2006  . HYPERTENSION 08/21/2006  . Infection of wound due to methicillin resistant Staphylococcus aureus (MRSA)   . Internal hemorrhoids   . Multiple allergies 10/14/2016  . OBESITY 06/04/2009  . Onychomycosis 10/27/2015  . Osteomyelitis of left foot ([place]) 05/29/2015  . Pulmonary sarcoidosis ([place])    Followed locally by pulmonology, but also by Dr. Casper Harrison at [facility]  . Right knee pain 01/26/2017  . Vocal cord dysfunction   . Wears partial dentures     Family History  Problem Relation Age of Onset  . Diabetes Father   . Heart attack Father 43  . Coronary artery disease Father   . Heart failure Father   . COPD Mother   . Emphysema Mother   . Asthma Mother   . Heart failure Mother   . Breast cancer Mother   . Heart attack Maternal Grandfather   . Sarcoidosis Maternal Uncle   . Lung cancer Brother   . Diabetes Brother   . Colon cancer Neg Hx     Past Surgical History:  Procedure Laterality Date  . ABDOMINAL HYSTERECTOMY    . APPENDECTOMY    . BLADDER SUSPENSION  11/11/2011   Procedure: TRANSVAGINAL TAPE (TVT) PROCEDURE;  Surgeon: Olga Millers, MD;  Location: [facility];   Service: Gynecology;  Laterality: N/A;  . CARDIAC CATHETERIZATION  07/2010   LVEF 50-55% WITH VERY MILD GLOBAL HYPOKINESIA; ESSENTIALLY NORMAL CORONARY ARTERIES; NORMAL LV FUNCTION  . CAROTIDS  02/18/11   CAROTID DUPLEX; VERTEBRALS ARE PATENT WITH ANTEGRADE FLOW. ICA/CCA RATIO 1.61 ON RIGHT AND 0.75 ON LEFT  . CHOLECYSTECTOMY  1984  . CYSTOSCOPY  11/11/2011   Procedure: CYSTOSCOPY;  Surgeon: Olga Millers, MD;  Location: [facility];  Service: Gynecology;  Laterality: N/A;  . DOPPLER ECHOCARDIOGRAPHY  02/12/2013   LV FUNCTION, SIZE NORMAL; MILD CONCENTRIC LVH; EST EF 55-65%; WALL MOTION NORMAL  . EXTUBATION (ENDOTRACHEAL) IN OR N/A 09/23/2017   Procedure: EXTUBATION (ENDOTRACHEAL) IN OR;  Surgeon: Helayne Seminole, MD;  Location: [facility];  Service: ENT;  Laterality:  N/A;  . FIBEROPTIC LARYNGOSCOPY AND TRACHEOSCOPY N/A 09/23/2017   Procedure: FLEXIBLE FIBEROPTIC LARYNGOSCOPY;  Surgeon: Helayne Seminole, MD;  Location: [facility];  Service: ENT;  Laterality: N/A;  . FRACTURE SURGERY     foot  . HERNIA REPAIR    . I&D EXTREMITY Left 06/27/2015   Procedure: Partial Excision Left Calcaneus, Place Antibiotic Beads, and Wound VAC;  Surgeon: Newt Minion, MD;  Location: [facility];  Service: Orthopedics;  Laterality: Left;  . KNEE ARTHROSCOPY     right  . LEFT AND RIGHT HEART CATHETERIZATION WITH CORONARY ANGIOGRAM N/A 04/23/2013   Procedure: LEFT AND RIGHT HEART CATHETERIZATION WITH CORONARY ANGIOGRAM;  Surgeon: Leonie Man, MD;  Location: [facility]ATH LAB;  Service: Cardiovascular;  Laterality: N/A;  . LEFT HEART CATH AND CORONARY ANGIOGRAPHY N/A 03/11/2017   Procedure: Left Heart Cath and Coronary Angiography;  Surgeon: Sherren Mocha, MD;  Location: [facility] CV LAB;  Service: Cardiovascular;  Laterality: N/A;  . LexiScan Myoview  03/09/2013   EF 50%; NORMAL MYOCARDIAL PERFUSION STUDY - breast attenuation  . METATARSAL OSTEOTOMY WITH OPEN REDUCTION INTERNAL FIXATION (ORIF) METATARSAL WITH FUSION Left  04/09/2014   Procedure: LEFT FOOT FRACTURE OPEN TREATMENT METATARSAL INCLUDES INTERNAL FIXATION EACH;  Surgeon: Lorn Junes, MD;  Location: [facility];  Service: Orthopedics;  Laterality: Left;  . NISSEN FUNDOPLICATION  5732  . Right and left CARDIAC CATHETERIZATION  04/23/2013   Angiographic normal coronaries; LVEDP 20 mmHg, PCWP 12-14 mmHg, RAP 12 mmHg.; Fick CO/CI 4.9/2.2  . TOTAL KNEE ARTHROPLASTY Right 06/29/2017   Procedure: RIGHT TOTAL KNEE ARTHROPLASTY;  Surgeon: Newt Minion, MD;  Location: [facility];  Service: Orthopedics;  Laterality: Right;  . TRACHEOSTOMY TUBE PLACEMENT N/A 09/20/2017   Procedure: AWAKE INTUBATION WITH ANESTHESIA WITH VIDEO ASSISTANCE;  Surgeon: Helayne Seminole, MD;  Location: [facility];  Service: ENT;  Laterality: N/A;  . TUBAL LIGATION     with reversal in 1994  . VENTRAL HERNIA REPAIR     Social History   Occupational History  . Occupation: DISABLED  Tobacco Use  . Smoking status: Never Smoker  . Smokeless tobacco: Never Used  Substance and Sexual Activity  . Alcohol use: No    Alcohol/week: 0.0 oz  . Drug use: No  . Sexual activity: Yes    Birth control/protection: Surgical

## 2017-10-31 ENCOUNTER — Other Ambulatory Visit: Payer: Self-pay | Admitting: Internal Medicine

## 2017-11-01 ENCOUNTER — Other Ambulatory Visit: Payer: Self-pay

## 2017-11-01 MED ORDER — INSULIN GLARGINE 300 UNIT/ML ~~LOC~~ SOPN: 40.0000 [IU] | PEN_INJECTOR | Freq: Every day | SUBCUTANEOUS | 11 refills | Status: DC

## 2017-11-02 ENCOUNTER — Other Ambulatory Visit: Payer: Self-pay

## 2017-11-07 ENCOUNTER — Ambulatory Visit (INDEPENDENT_AMBULATORY_CARE_PROVIDER_SITE_OTHER): Payer: BLUE CROSS/BLUE SHIELD

## 2017-11-07 ENCOUNTER — Encounter (INDEPENDENT_AMBULATORY_CARE_PROVIDER_SITE_OTHER): Payer: Self-pay | Admitting: Orthopedic Surgery

## 2017-11-07 ENCOUNTER — Ambulatory Visit (INDEPENDENT_AMBULATORY_CARE_PROVIDER_SITE_OTHER): Payer: BLUE CROSS/BLUE SHIELD | Admitting: Orthopedic Surgery

## 2017-11-07 DIAGNOSIS — M25562 Pain in left knee: Secondary | ICD-10-CM | POA: Diagnosis not present

## 2017-11-07 DIAGNOSIS — M1712 Unilateral primary osteoarthritis, left knee: Secondary | ICD-10-CM | POA: Diagnosis not present

## 2017-11-07 NOTE — Progress Notes (Signed)
Office Visit Note   Patient: [name]           Date of Birth: Nov 14, 1960           MRN: [id number] Visit Date: 11/07/2017              Requested by: [name], MD [place], [place] [id number] PCP: [name], MD  Chief Complaint  Patient presents with  . Left Knee - Pain      HPI: Patient is a 57 year old woman status post a right total knee arthroplasty which she states is doing great.  She has been having increasing pain of the left knee with giving way swelling night pain pain with activities of daily living.  Assessment & Plan: Visit Diagnoses:  1. Acute pain of left knee   2. Unilateral primary osteoarthritis, left knee     Plan: Patient states that she would like to proceed with a total knee arthroplasty on the left.  Risk and benefits were discussed patient states she understands wished to proceed at this time she would like to return to home postoperatively with home health therapy and then would like to follow-up with [facility] for outpatient therapy.  Follow-Up Instructions: Return if symptoms worsen or fail to improve.   Ortho Exam  Patient is alert, oriented, no adenopathy, well-dressed, normal affect, normal respiratory effort. Examination patient has an antalgic gait.  She has full range of motion of the right knee left knee she lacks about 10 degrees to full extension.  There is varus alignment with standing.  She is tender to palpation over the medial and lateral joint lines collateral ligaments are stable she has pain and crepitation of the patellofemoral joint with range of motion.  There is no redness no cellulitis no open wound.  Imaging: Xr Knee 1-2 Views Left  Result Date: 11/07/2017 2 view radiographs of the left knee shows bone-on-bone contact medial joint line there is subcondylar sclerosis periarticular cysts and periarticular bony spurs including all 3 compartments.  There is some calcification of the  meniscus.  No images are attached to the encounter.  Labs: Lab Results  Component Value Date   HGBA1C 8.7 (H) 09/20/2017   HGBA1C 7.8 (H) 08/13/2017   HGBA1C 7.5 (H) 06/17/2017   ESRSEDRATE 28 06/16/2017   ESRSEDRATE 5 01/26/2017   ESRSEDRATE 27 10/14/2016   CRP 27.1 (H) 06/16/2017   CRP 17.3 (H) 01/26/2017   CRP 24.0 (H) 10/14/2016   LABURIC 5.1 01/26/2017   REPTSTATUS 09/22/2017 FINAL 09/20/2017   GRAMSTAIN  08/14/2017    MODERATE WBC PRESENT, PREDOMINANTLY PMN MODERATE BUDDING YEAST SEEN    CULT NO GROUP A STREP (S.PYOGENES) ISOLATED 09/20/2017   LABORGA PSEUDOMONAS FLUORESCENS 08/14/2017    @LABSALLVALUES (HGBA1)@  There is no height or weight on file to calculate BMI.  Orders:  Orders Placed This Encounter  Procedures  . XR Knee 1-2 Views Left   No orders of the defined types were placed in this encounter.    Procedures: No procedures performed  Clinical Data: No additional findings.  ROS:  All other systems negative, except as noted in the HPI. Review of Systems  Objective: Vital Signs: There were no vitals taken for this visit.  Specialty Comments:  No specialty comments available.  PMFS History: Patient Active Problem List   Diagnosis Date Noted  . Unilateral primary osteoarthritis, left knee 11/07/2017  . Dysphagia 09/20/2017  . Epiglottitis   . Plantar fasciitis, right 07/13/2017  .  S/P total knee arthroplasty, right 06/29/2017  . Osteoarthrosis, localized, primary, knee, right   . Sprain of calcaneofibular ligament of right ankle 06/06/2017  . Osteoarthritis of right knee 01/26/2017  . Multiple allergies 10/14/2016  . Onychomycosis 10/27/2015  . Osteomyelitis of left foot ([place]) 05/29/2015  . CKD (chronic kidney disease) stage 3, GFR 30-59 ml/min (HCC) 04/27/2015  . MRSA (methicillin resistant staph aureus) culture positive 03/27/2015  . Wound infection complicating hardware ([place]) 03/27/2015  . Fatty liver 09/30/2014  . Hot flashes  07/15/2014  . Abnormal SPEP 04/17/2014  . Fracture of left leg 04/17/2014  . Cushingoid side effect of steroids ([place]) 04/17/2014  . Internal hemorrhoids   . Depression   . Preoperative clearance 03/25/2014  . Obstructive chronic bronchitis without exacerbation ([place]) 09/18/2013  . Chest pain 04/11/2013  . Hypertensive heart disease with chronic diastolic congestive heart failure ([place])   . Solitary pulmonary nodule, on CT 02/2013 - stable over 2 years in 2015 02/20/2013  . Gout 08/20/2010  . Anemia 09/18/2009  . Morbid obesity ([place]) 06/04/2009  . Sleep apnea 04/21/2009  . Sarcoidosis of lung ([place]) 04/10/2007  . Hyperlipidemia 08/21/2006  . Essential hypertension 08/21/2006  . GERD 08/21/2006  . Type II diabetes mellitus with renal manifestations ([place]) 08/21/2006   Past Medical History:  Diagnosis Date  . Abnormal SPEP 04/17/2014  . Acute left ankle pain 01/26/2017  . ANEMIA-UNSPECIFIED 09/18/2009  . CHF (congestive heart failure) ([place])   . Chronic diastolic heart failure, NYHA class 2 (HCC)    LVEDP roughly 20% by cath  . COPD (chronic obstructive pulmonary disease) ([place])   . Depression   . DIABETES MELLITUS, TYPE II 08/21/2006  . Diabetic osteomyelitis ([place]) 05/29/2015  . Fracture of 5th metatarsal    non union  . GERD 08/21/2006  . GOUT 08/20/2010  . Hx of umbilical hernia repair   . HYPERLIPIDEMIA 08/21/2006  . HYPERTENSION 08/21/2006  . Infection of wound due to methicillin resistant Staphylococcus aureus (MRSA)   . Internal hemorrhoids   . Multiple allergies 10/14/2016  . OBESITY 06/04/2009  . Onychomycosis 10/27/2015  . Osteomyelitis of left foot ([place]) 05/29/2015  . Pulmonary sarcoidosis ([place])    Followed locally by pulmonology, but also by Dr. Casper Harrison at [facility]  . Right knee pain 01/26/2017  . Vocal cord dysfunction   . Wears partial dentures     Family History  Problem Relation Age of Onset  . Diabetes Father   . Heart attack Father 20  . Coronary  artery disease Father   . Heart failure Father   . COPD Mother   . Emphysema Mother   . Asthma Mother   . Heart failure Mother   . Breast cancer Mother   . Heart attack Maternal Grandfather   . Sarcoidosis Maternal Uncle   . Lung cancer Brother   . Diabetes Brother   . Colon cancer Neg Hx     Past Surgical History:  Procedure Laterality Date  . ABDOMINAL HYSTERECTOMY    . APPENDECTOMY    . BLADDER SUSPENSION  11/11/2011   Procedure: TRANSVAGINAL TAPE (TVT) PROCEDURE;  Surgeon: Olga Millers, MD;  Location: [facility];  Service: Gynecology;  Laterality: N/A;  . CARDIAC CATHETERIZATION  07/2010   LVEF 50-55% WITH VERY MILD GLOBAL HYPOKINESIA; ESSENTIALLY NORMAL CORONARY ARTERIES; NORMAL LV FUNCTION  . CAROTIDS  02/18/11   CAROTID DUPLEX; VERTEBRALS ARE PATENT WITH ANTEGRADE FLOW. ICA/CCA RATIO 1.61 ON RIGHT AND 0.75 ON LEFT  . CHOLECYSTECTOMY  [place]  11/11/2011   Procedure: CYSTOSCOPY;  Surgeon: Olga Millers, MD;  Location: [facility];  Service: Gynecology;  Laterality: N/A;  . DOPPLER ECHOCARDIOGRAPHY  02/12/2013   LV FUNCTION, SIZE NORMAL; MILD CONCENTRIC LVH; EST EF 55-65%; WALL MOTION NORMAL  . EXTUBATION (ENDOTRACHEAL) IN OR N/A 09/23/2017   Procedure: EXTUBATION (ENDOTRACHEAL) IN OR;  Surgeon: Helayne Seminole, MD;  Location: [facility];  Service: ENT;  Laterality: N/A;  . FIBEROPTIC LARYNGOSCOPY AND TRACHEOSCOPY N/A 09/23/2017   Procedure: FLEXIBLE FIBEROPTIC LARYNGOSCOPY;  Surgeon: Helayne Seminole, MD;  Location: [facility];  Service: ENT;  Laterality: N/A;  . FRACTURE SURGERY     foot  . HERNIA REPAIR    . I&D EXTREMITY Left 06/27/2015   Procedure: Partial Excision Left Calcaneus, Place Antibiotic Beads, and Wound VAC;  Surgeon: Newt Minion, MD;  Location: [facility];  Service: Orthopedics;  Laterality: Left;  . KNEE ARTHROSCOPY     right  . LEFT AND RIGHT HEART CATHETERIZATION WITH CORONARY ANGIOGRAM N/A 04/23/2013   Procedure: LEFT AND RIGHT HEART CATHETERIZATION WITH  CORONARY ANGIOGRAM;  Surgeon: Leonie Man, MD;  Location: [facility] CATH LAB;  Service: Cardiovascular;  Laterality: N/A;  . LEFT HEART CATH AND CORONARY ANGIOGRAPHY N/A 03/11/2017   Procedure: Left Heart Cath and Coronary Angiography;  Surgeon: Sherren Mocha, MD;  Location: [facility];  Service: Cardiovascular;  Laterality: N/A;  . LexiScan Myoview  03/09/2013   EF 50%; NORMAL MYOCARDIAL PERFUSION STUDY - breast attenuation  . METATARSAL OSTEOTOMY WITH OPEN REDUCTION INTERNAL FIXATION (ORIF) METATARSAL WITH FUSION Left 04/09/2014   Procedure: LEFT FOOT FRACTURE OPEN TREATMENT METATARSAL INCLUDES INTERNAL FIXATION EACH;  Surgeon: Lorn Junes, MD;  Location: [facility];  Service: Orthopedics;  Laterality: Left;  . NISSEN FUNDOPLICATION  2536  . Right and left CARDIAC CATHETERIZATION  04/23/2013   Angiographic normal coronaries; LVEDP 20 mmHg, PCWP 12-14 mmHg, RAP 12 mmHg.; Fick CO/CI 4.9/2.2  . TOTAL KNEE ARTHROPLASTY Right 06/29/2017   Procedure: RIGHT TOTAL KNEE ARTHROPLASTY;  Surgeon: Newt Minion, MD;  Location: [facility];  Service: Orthopedics;  Laterality: Right;  . TRACHEOSTOMY TUBE PLACEMENT N/A 09/20/2017   Procedure: AWAKE INTUBATION WITH ANESTHESIA WITH VIDEO ASSISTANCE;  Surgeon: Helayne Seminole, MD;  Location: [facility];  Service: ENT;  Laterality: N/A;  . TUBAL LIGATION     with reversal in 1994  . VENTRAL HERNIA REPAIR     Social History   Occupational History  . Occupation: DISABLED  Tobacco Use  . Smoking status: Never Smoker  . Smokeless tobacco: Never Used  Substance and Sexual Activity  . Alcohol use: No    Alcohol/week: 0.0 oz  . Drug use: No  . Sexual activity: Yes    Birth control/protection: Surgical

## 2017-11-11 ENCOUNTER — Other Ambulatory Visit: Payer: Self-pay

## 2017-11-11 MED ORDER — POTASSIUM CHLORIDE ER 20 MEQ PO TBCR: 1.0000 | EXTENDED_RELEASE_TABLET | Freq: Three times a day (TID) | ORAL | 3 refills | Status: DC

## 2017-11-14 ENCOUNTER — Other Ambulatory Visit (INDEPENDENT_AMBULATORY_CARE_PROVIDER_SITE_OTHER): Payer: Self-pay | Admitting: Family

## 2017-11-14 MED ORDER — DIPHENHYDRAMINE HCL 50 MG/ML IJ SOLN: 50.0000 mg | INTRAMUSCULAR | Status: DC

## 2017-11-21 ENCOUNTER — Other Ambulatory Visit: Payer: Self-pay | Admitting: *Deleted

## 2017-11-21 MED ORDER — FENOFIBRATE 145 MG PO TABS: 145.0000 mg | ORAL_TABLET | Freq: Every day | ORAL | 0 refills | Status: DC

## 2017-11-22 ENCOUNTER — Other Ambulatory Visit: Payer: Self-pay | Admitting: Obstetrics and Gynecology

## 2017-11-22 DIAGNOSIS — N644 Mastodynia: Secondary | ICD-10-CM

## 2017-11-22 DIAGNOSIS — N6310 Unspecified lump in the right breast, unspecified quadrant: Secondary | ICD-10-CM

## 2017-11-23 ENCOUNTER — Encounter: Payer: Self-pay | Admitting: Family Medicine

## 2017-11-23 ENCOUNTER — Ambulatory Visit (INDEPENDENT_AMBULATORY_CARE_PROVIDER_SITE_OTHER): Payer: BLUE CROSS/BLUE SHIELD | Admitting: Family Medicine

## 2017-11-23 VITALS — BP 116/72 | HR 70 | Temp 98.0°F | Ht 66.0 in | Wt 246.2 lb

## 2017-11-23 DIAGNOSIS — J0100 Acute maxillary sinusitis, unspecified: Secondary | ICD-10-CM

## 2017-11-23 DIAGNOSIS — Z961 Presence of intraocular lens: Secondary | ICD-10-CM | POA: Diagnosis not present

## 2017-11-23 DIAGNOSIS — H52203 Unspecified astigmatism, bilateral: Secondary | ICD-10-CM | POA: Diagnosis not present

## 2017-11-23 DIAGNOSIS — E119 Type 2 diabetes mellitus without complications: Secondary | ICD-10-CM | POA: Diagnosis not present

## 2017-11-23 MED ORDER — AMOXICILLIN-POT CLAVULANATE 875-125 MG PO TABS: 1.0000 | ORAL_TABLET | Freq: Two times a day (BID) | ORAL | 0 refills | Status: DC

## 2017-11-23 NOTE — Progress Notes (Signed)
Subjective:  [name] is a 57 y.o. year old very pleasant female patient who presents for/with See problem oriented charting ROS- nasal congetion persists, sinus pressure, some headaches. No fever or chills or shortness of breath   Past Medical History-  Patient Active Problem List   Diagnosis Date Noted  . Obstructive chronic bronchitis without exacerbation ([place]) 09/18/2013    Priority: High  . Chest pain 04/11/2013    Priority: High  . Hypertensive heart disease with chronic diastolic congestive heart failure ([place])     Priority: High  . Sarcoidosis of lung ([place]) 04/10/2007    Priority: High  . Type II diabetes mellitus with renal manifestations ([place]) 08/21/2006    Priority: High  . Epiglottitis     Priority: Medium  . Osteoarthritis of right knee 01/26/2017    Priority: Medium  . CKD (chronic kidney disease) stage 3, GFR 30-59 ml/min (HCC) 04/27/2015    Priority: Medium  . Fatty liver 09/30/2014    Priority: Medium  . Depression     Priority: Medium  . Gout 08/20/2010    Priority: Medium  . Anemia 09/18/2009    Priority: Medium  . Sleep apnea 04/21/2009    Priority: Medium  . Hyperlipidemia 08/21/2006    Priority: Medium  . Essential hypertension 08/21/2006    Priority: Medium  . Dysphagia 09/20/2017    Priority: Low  . Plantar fasciitis, right 07/13/2017    Priority: Low  . Osteoarthrosis, localized, primary, knee, right     Priority: Low  . Sprain of calcaneofibular ligament of right ankle 06/06/2017    Priority: Low  . Onychomycosis 10/27/2015    Priority: Low  . MRSA (methicillin resistant staph aureus) culture positive 03/27/2015    Priority: Low  . Hot flashes 07/15/2014    Priority: Low  . Abnormal SPEP 04/17/2014    Priority: Low  . Fracture of left leg 04/17/2014    Priority: Low  . Cushingoid side effect of steroids ([place]) 04/17/2014    Priority: Low  . Internal hemorrhoids     Priority: Low  . Preoperative clearance 03/25/2014   Priority: Low  . Solitary pulmonary nodule, on CT 02/2013 - stable over 2 years in 2015 02/20/2013    Priority: Low  . Morbid obesity ([place]) 06/04/2009    Priority: Low  . GERD 08/21/2006    Priority: Low  . Unilateral primary osteoarthritis, left knee 11/07/2017  . S/P total knee arthroplasty, right 06/29/2017  . Multiple allergies 10/14/2016  . Osteomyelitis of left foot ([place]) 05/29/2015  . Wound infection complicating hardware ([place]) 03/27/2015    Medications- reviewed and updated Current Outpatient Medications  Medication Sig Dispense Refill  . albuterol (PROVENTIL HFA;VENTOLIN HFA) 108 (90 Base) MCG/ACT inhaler Inhale 1-2 puffs into the lungs every 6 (six) hours as needed for wheezing or shortness of breath. 8 g 5  . allopurinol (ZYLOPRIM) 100 MG tablet Take 100 mg by mouth 2 (two) times daily.     [name] aspirin EC 81 MG tablet Take 81 mg by mouth daily.     [name] atorvastatin (LIPITOR) 40 MG tablet TAKE 1 TAB BY MOUTH ONCE DAILY (Patient taking differently: Take 40 mg by mouth once a day) 90 tablet 3  . BAYER MICROLET LANCETS lancets Use as instructed to check sugar 3 times daily 300 each 5  . benzonatate (TESSALON) 200 MG capsule Take 200 mg by mouth 3 (three) times daily as needed for cough.     . Blood Glucose Monitoring Suppl (  BAYER CONTOUR MONITOR) w/Device KIT Use daily to check sugar 1 kit 0  . buPROPion (WELLBUTRIN XL) 300 MG 24 hr tablet TAKE 1 TABLET (300 MG TOTAL) BY MOUTH DAILY. 90 tablet 3  . Calcium Citrate 200 MG TABS Take 200 mg by mouth daily.    . carvedilol (COREG) 25 MG tablet Take 1 tablet (25 mg total) by mouth 2 (two) times daily with a meal. 180 tablet 3  . chlorpheniramine-HYDROcodone (TUSSIONEX) 10-8 MG/5ML SUER Take 5 mLs by mouth every 12 (twelve) hours as needed for cough.  0  . Cholecalciferol (VITAMIN D) 2000 units tablet Take 2,000 Units by mouth daily.    . Cyanocobalamin (VITAMIN B-12 PO) Take 2,500 mcg by mouth daily.     [name] dexlansoprazole (DEXILANT) 60 MG  capsule Take 1 capsule (60 mg total) by mouth daily. 30 capsule 1  . diclofenac sodium (VOLTAREN) 1 % GEL APPLY 2 GRAMS TOPICALLY 4 TIMES A DAY (Patient taking differently: APPLY 2 GRAMS TOPICALLY 4 TIMES A DAY AS NEEDED FOR KNEE PAIN) 100 g 0  . diltiazem (CARDIZEM CD) 120 MG 24 hr capsule Take 1 capsule (120 mg total) by mouth daily. 90 capsule 3  . fenofibrate (TRICOR) 145 MG tablet Take 1 tablet (145 mg total) by mouth daily. NEED OV. 30 tablet 0  . fluticasone (FLONASE) 50 MCG/ACT nasal spray Place 2 sprays into both nostrils daily. (Patient taking differently: Place 2 sprays into both nostrils daily as needed for allergies. ) 48 g 3  . fluticasone furoate-vilanterol (BREO ELLIPTA) 200-25 MCG/INH AEPB Inhale 1 puff into the lungs daily.    . furosemide (LASIX) 40 MG tablet Take 1 tablet (40 mg total) by mouth 2 (two) times daily. 180 tablet 1  . gabapentin (NEURONTIN) 100 MG capsule TAKE 1 CAPSULE BY MOUTH THREE TIMES A DAY (Patient taking differently: TAKE 1 CAPSULE BY MOUTH TWO TIMES A DAY) 90 capsule 0  . glucose blood (BAYER CONTOUR TEST) test strip Use as instructed to check sugar 3 times daily 300 each 5  . insulin aspart (NOVOLOG FLEXPEN) 100 UNIT/ML FlexPen Inject 25-30 units in the skin 3 times daily with meals 30 mL 5  . Insulin Glargine (TOUJEO SOLOSTAR) 300 UNIT/ML SOPN Inject 40 Units into the skin at bedtime. 5 pen 11  . Insulin Pen Needle 33G X 4 MM MISC 1 each by Does not apply route 4 (four) times daily. 200 each 11  . LORazepam (ATIVAN) 0.5 MG tablet Take 0.5 mg by mouth every 12 (twelve) hours as needed for anxiety.    . metFORMIN (GLUCOPHAGE) 1000 MG tablet Take 1 tablet (1,000 mg total) by mouth 2 (two) times daily with a meal. 180 tablet 0  . nitroGLYCERIN (NITROSTAT) 0.4 MG SL tablet Place 1 tablet (0.4 mg total) under the tongue every 5 (five) minutes as needed for chest pain. Reported on 01/05/2016 100 tablet 0  . ondansetron (ZOFRAN-ODT) 4 MG disintegrating tablet Take 4  mg by mouth every 8 (eight) hours as needed for nausea or vomiting.     [name] PARoxetine Mesylate (BRISDELLE) 7.5 MG CAPS Take 7.5 mg by mouth daily.    . Potassium Chloride ER 20 MEQ TBCR Take 1 tablet by mouth 3 (three) times daily. (Patient taking differently: Take 1.5 tablets by mouth 2 (two) times daily. ) 90 tablet 3  . predniSONE (DELTASONE) 5 MG tablet Take 5 mg by mouth daily with breakfast.    . triamcinolone cream (KENALOG) 0.1 % Apply 1 application  topically daily as needed (for irritation).     . venlafaxine XR (EFFEXOR-XR) 75 MG 24 hr capsule Take 2 capsules (150 mg total) by mouth at bedtime. (Patient taking differently: Take 75 mg by mouth 2 (two) times daily. )     No current facility-administered medications for this visit.     Objective: BP 116/72 (BP Location: Left Arm, Patient Position: Sitting, Cuff Size: Large)   Pulse 70   Temp 98 F (36.7 C) (Oral)   Ht 5' 6"  (1.676 m)   Wt 246 lb 3.2 oz (111.7 kg)   SpO2 97%   BMI 39.74 kg/m  Gen: NAD, resting comfortably Yellow nasal discharge noted in bilateral nasal turbinates. Sinus pressure maxillary. Dryness in oropharynx CV: RRR no murmurs rubs or gallops Lungs: CTAB no crackles, wheeze, rhonchi Abdomen: soft/nontender/nondistended/normal bowel sounds. Ext: no edema Skin: warm, dry, no rash  Assessment/Plan:  Acute non recurrent maxillary sinusitis S: last month seen for sinus infection- treated with augmentin for 7 days. Felt fair bit better with sinus pressure and drainage but never completely resolved. Symptoms then worsened after she finished the antibiotic- sinus drainage, pressure, headaches, nose congestion, dry mouth.  A/P: Patient with improvement last month with augmentin for sinusitis last month but not resolution. Would not call this recurrent but instead never fully cleared sinusitis. She tolerates augmentin well will push for 10 days. If not resolved by day 9 or 10 would send in 4 more days for her  Future  Appointments  Date Time Provider [place]  11/29/2017 10:00 AM [facility] PAT 3 [facility] None  11/30/2017  8:00 AM Renato Shin, MD [facility] None  12/01/2017 11:00 AM [facility] DIAG TOMO 1 [facility]-BCGMM GI-BREAST CE  12/01/2017 11:10 AM [facility]G [place] 1 GI-[facility]GUS GI-BREAST CE  12/01/2017 11:15 AM GI[facility]G [place] 1 GI-BCGUS GI-BREAST CE  12/21/2017 12:45 PM Newt Minion, MD [facility] None  02/20/2018 10:45 AM [name], MD [facility] PEC   3-6 month follow up  Meds ordered this encounter  Medications  . amoxicillin-clavulanate (AUGMENTIN) 875-125 MG tablet    Sig: Take 1 tablet by mouth 2 (two) times daily.    Dispense:  20 tablet    Refill:  0    Return precautions advised.  Garret Reddish, MD

## 2017-11-23 NOTE — Patient Instructions (Addendum)
Lets try a 10 day course of augmentin since you were close to knocking it out but didn't completely resolve. If not gone by day 9 or 10 please call us as could add 4 more days.

## 2017-11-28 LAB — HM DIABETES EYE EXAM

## 2017-11-28 NOTE — Pre-Procedure Instructions (Addendum)
[name]  11/28/2017      [name] [name], [place] [address] [place] [address] Phone: [phone] Fax: [phone]  [name] [place], [place] [address] [place] [address] Phone: [phone] Fax: [phone]    Your procedure is scheduled on Feb 27.  Report to [facility] Admitting at 530 A.M.  Call this number if you have problems the morning of surgery:  [phone]   Remember:  Do not eat food or drink liquids after midnight.  Take these medicines the morning of surgery with A SIP OF WATER    Bring your inhalers with you on the day of surgery.  Stop taking aspirin as directed by your Dr. Stop taking BC's, Goody's, herbal medications, Fish Oil, Ibuprofen, Advil, Motrin, Aleve, Vitamins    How to Manage Your Diabetes Before and After Surgery  Why is it important to control my blood sugar before and after surgery? . Improving blood sugar levels before and after surgery helps healing and can limit problems. . A way of improving blood sugar control is eating a healthy diet by: o  Eating less sugar and carbohydrates o  Increasing activity/exercise o  Talking with your doctor about reaching your blood sugar goals . High blood sugars (greater than 180 mg/dL) can raise your risk of infections and slow your recovery, so you will need to focus on controlling your diabetes during the weeks before surgery. . Make sure that the doctor who takes care of your diabetes knows about your planned surgery including the date and location.  How do I manage my blood sugar before surgery? . Check your blood sugar at least 4 times a day, starting 2 days before surgery, to make sure that the level is not too high or low. o Check your blood sugar the morning of your surgery when you wake up and every 2 hours until you get to the Short Stay unit. . If your blood sugar is less than  70 mg/dL, you will need to treat for low blood sugar: o Do not take insulin. o Treat a low blood sugar (less than 70 mg/dL) with  cup of clear juice (cranberry or apple), 4 glucose tablets, OR glucose gel. Recheck blood sugar in 15 minutes after treatment (to make sure it is greater than 70 mg/dL). If your blood sugar is not greater than 70 mg/dL on recheck, call [phone] o  for further instructions. . Report your blood sugar to the short stay nurse when you get to Short Stay.  . If you are admitted to the hospital after surgery: o Your blood sugar will be checked by the staff and you will probably be given insulin after surgery (instead of oral diabetes medicines) to make sure you have good blood sugar levels. o The goal for blood sugar control after surgery is 80-180 mg/dL.              WHAT DO I DO ABOUT MY DIABETES MEDICATION?   [name] Do not take oral diabetes medicines (pills) the morning of surgery.  . THE NIGHT BEFORE SURGERY, take ___________ units of ___________insulin.       . THE MORNING OF SURGERY, take _____________ units of __________insulin.  . The day of surgery, do not take other diabetes injectables, including Byetta (exenatide), Bydureon (exenatide ER), Victoza (liraglutide), or Trulicity (dulaglutide).  . If your CBG is greater than 220  mg/dL, you may take  of your sliding scale (correction) dose of insulin.  Other Instructions:          Patient Signature:  Date:   Nurse Signature:  Date:   Reviewed and Endorsed by [facility] Patient Education Committee, August 2015  Do not wear jewelry, make-up or nail polish.  Do not wear lotions, powders, or perfumes, or deodorant.  Do not shave 48 hours prior to surgery.  Men may shave face and neck.  Do not bring valuables to the hospital.  [facility] is not responsible for any belongings or valuables.  Contacts, dentures or bridgework may not be worn into surgery.  Leave your suitcase in the car.   After surgery it may be brought to your room.  For patients admitted to the hospital, discharge time will be determined by your treatment team.  Patients discharged the day of surgery will not be allowed to drive home.    Special instructions:  [facility] - Preparing for Surgery  Before surgery, you can play an important role.  Because skin is not sterile, your skin needs to be as free of germs as possible.  You can reduce the number of germs on you skin by washing with CHG (chlorahexidine gluconate) soap before surgery.  CHG is an antiseptic cleaner which kills germs and bonds with the skin to continue killing germs even after washing.  Please DO NOT use if you have an allergy to CHG or antibacterial soaps.  If your skin becomes reddened/irritated stop using the CHG and inform your nurse when you arrive at Short Stay.  Do not shave (including legs and underarms) for at least 48 hours prior to the first CHG shower.  You may shave your face.  Please follow these instructions carefully:   1.  Shower with CHG Soap the night before surgery and the  morning of Surgery.  2.  If you choose to wash your hair, wash your hair first as usual with your  normal shampoo.  3.  After you shampoo, rinse your hair and body thoroughly to remove the Shampoo.  4.  Use CHG as you would any other liquid soap.  You can apply chg directly to the skin and wash  gently with scrungie or a clean washcloth.  5.  Apply the CHG Soap to your body ONLY FROM THE NECK DOWN.  Do not use on open wounds or open sores.  Avoid contact with your eyes,  ears, mouth and genitals (private parts).  Wash genitals (private  parts) with your normal soap.  6.  Wash thoroughly, paying special attention to the area where your surgery will be performed.  7.  Thoroughly rinse your body with warm water from the neck down.  8.  DO NOT shower/wash with your normal soap after using and rinsing off  the CHG Soap.  9.  Pat yourself dry with a  clean towel.            10.  Wear clean pajamas.            11.  Place clean sheets on your bed the night of your first shower and do not sleep with pets.  Day of Surgery  Do not apply any lotions/deoderants the morning of surgery.  Please wear clean clothes to the hospital/surgery center.     Please read over the following fact sheets that you were given. Pain Booklet, Coughing and Deep Breathing, MRSA Information and Surgical Site Infection Prevention

## 2017-11-29 ENCOUNTER — Encounter (HOSPITAL_COMMUNITY): Payer: Self-pay

## 2017-11-29 ENCOUNTER — Telehealth (INDEPENDENT_AMBULATORY_CARE_PROVIDER_SITE_OTHER): Payer: Self-pay

## 2017-11-29 ENCOUNTER — Other Ambulatory Visit: Payer: Self-pay

## 2017-11-29 ENCOUNTER — Other Ambulatory Visit (INDEPENDENT_AMBULATORY_CARE_PROVIDER_SITE_OTHER): Payer: Self-pay

## 2017-11-29 ENCOUNTER — Encounter (HOSPITAL_COMMUNITY)
Admission: RE | Admit: 2017-11-29 | Discharge: 2017-11-29 | Disposition: A | Discharge status: Home / Self Care | Payer: BLUE CROSS/BLUE SHIELD | Source: Ambulatory Visit | Attending: Orthopedic Surgery | Admitting: Orthopedic Surgery

## 2017-11-29 DIAGNOSIS — E785 Hyperlipidemia, unspecified: Secondary | ICD-10-CM | POA: Diagnosis not present

## 2017-11-29 DIAGNOSIS — E119 Type 2 diabetes mellitus without complications: Secondary | ICD-10-CM | POA: Insufficient documentation

## 2017-11-29 DIAGNOSIS — D649 Anemia, unspecified: Secondary | ICD-10-CM | POA: Insufficient documentation

## 2017-11-29 DIAGNOSIS — D860 Sarcoidosis of lung: Secondary | ICD-10-CM | POA: Diagnosis not present

## 2017-11-29 DIAGNOSIS — Z01812 Encounter for preprocedural laboratory examination: Secondary | ICD-10-CM | POA: Insufficient documentation

## 2017-11-29 DIAGNOSIS — Z7982 Long term (current) use of aspirin: Secondary | ICD-10-CM | POA: Insufficient documentation

## 2017-11-29 DIAGNOSIS — Z79899 Other long term (current) drug therapy: Secondary | ICD-10-CM | POA: Insufficient documentation

## 2017-11-29 DIAGNOSIS — I5032 Chronic diastolic (congestive) heart failure: Secondary | ICD-10-CM | POA: Insufficient documentation

## 2017-11-29 DIAGNOSIS — I110 Hypertensive heart disease with heart failure: Secondary | ICD-10-CM | POA: Insufficient documentation

## 2017-11-29 DIAGNOSIS — J449 Chronic obstructive pulmonary disease, unspecified: Secondary | ICD-10-CM | POA: Diagnosis not present

## 2017-11-29 DIAGNOSIS — Z9889 Other specified postprocedural states: Secondary | ICD-10-CM | POA: Diagnosis not present

## 2017-11-29 HISTORY — DX: Anxiety disorder, unspecified: F41.9

## 2017-11-29 LAB — CBC
HCT: 34.2 % — ABNORMAL LOW (ref 36.0–46.0)
Hemoglobin: 10.5 g/dL — ABNORMAL LOW (ref 12.0–15.0)
MCH: 26.7 pg (ref 26.0–34.0)
MCHC: 30.7 g/dL (ref 30.0–36.0)
MCV: 87 fL (ref 78.0–100.0)
Platelets: 400 10*3/uL (ref 150–400)
RBC: 3.93 MIL/uL (ref 3.87–5.11)
RDW: 14.3 % (ref 11.5–15.5)
WBC: 14.4 10*3/uL — ABNORMAL HIGH (ref 4.0–10.5)

## 2017-11-29 LAB — BASIC METABOLIC PANEL
Anion gap: 14 (ref 5–15)
BUN: 17 mg/dL (ref 6–20)
CO2: 26 mmol/L (ref 22–32)
Calcium: 9.8 mg/dL (ref 8.9–10.3)
Chloride: 101 mmol/L (ref 101–111)
Creatinine, Ser: 1 mg/dL (ref 0.44–1.00)
GFR calc Af Amer: >60 mL/min (ref >60)
GFR calc non Af Amer: >60 mL/min (ref >60)
Glucose, Bld: 75 mg/dL (ref 65–99)
Potassium: 3.7 mmol/L (ref 3.5–5.1)
Sodium: 141 mmol/L (ref 135–145)

## 2017-11-29 LAB — HEMOGLOBIN A1C
Hgb A1c MFr Bld: 8.4 % — ABNORMAL HIGH (ref 4.8–5.6)
Mean Plasma Glucose: 194.38 mg/dL

## 2017-11-29 LAB — GLUCOSE, CAPILLARY: Glucose-Capillary: 154 mg/dL — ABNORMAL HIGH (ref 65–99)

## 2017-11-29 LAB — SURGICAL PCR SCREEN
MRSA, PCR: POSITIVE — AB
Staphylococcus aureus: POSITIVE — AB

## 2017-11-29 NOTE — Telephone Encounter (Signed)
Please see below.

## 2017-11-29 NOTE — Telephone Encounter (Signed)
[facility] called letting us know that patient was positive for MRSA ans staph on her PCR screening. She said Sharol Given has ordered ancef and wondering if he wants to change that now. Patient is ALLERGIC to vancomycin which is usually what's given instead.

## 2017-11-29 NOTE — Pre-Procedure Instructions (Signed)
EMIYA LOOMER  11/29/2017      [name] [name], [place] [address] [place] [address] Phone: [phone] Fax: [phone]  [name] [place] [name] [facility] [place] [address] [place] [address] Phone: [phone] Fax: [phone]    Your procedure is scheduled on Wednesday, Dec 07, 2017.  Report to [facility] Admitting at 530 A.M.  Call this number if you have problems the morning of surgery:  [phone]   Remember:  Do not eat food or drink liquids after midnight.   Continue all medications as directed by your physician except follow these medication instructions before surgery below   Take these medicines the morning of surgery with A SIP OF WATER: Albuterol (Proventil, Ventolin) inhaler - if needed Allopurinol (Zyloprim) Benzonatate (Tessalon) - if needed for cough Bupropion (Wellbutrin XL) Carvedilol (Coreg) Dexlansoprazole (Dexilant) Diltiazem (Cardizem CD) Fluticasone (Flonase) - if needed for allergies Fluticasone Furoate-Vilanterol (Breo Ellipta) inhaler Gabapentin (Neurontin) Lorazepam (Ativan) - if needed for anxiety Parozetine Mesylate (Brisdelle) Prednisone (Deltasone) Vanlafaxine XR (Effexor-XR)  7 days prior to surgery STOP taking any Aspirin (unless otherwise instructed by your surgeon), Aleve, Naproxen, Ibuprofen, Motrin, Advil, Goody's, BC's, all herbal medications, fish oil, and all vitamins, including your Diclofenac Sodium (Voltaren) gel  Bring your inhalers with you on the day of surgery.   WHAT DO I DO ABOUT MY DIABETES MEDICATION?   [name] Do not take oral diabetes medicines (pills) the morning of surgery - DO NOT take your metformin the morning of surgery.  . THE NIGHT BEFORE SURGERY, take 20 units of Insulin Glargine (Toujeo Solostar).   . The morning of surgery, if your CBG is greater than 220 mg/dL, you may take  of your sliding scale (correction)  dose of insulin.    How to Manage Your Diabetes Before and After Surgery  Why is it important to control my blood sugar before and after surgery? . Improving blood sugar levels before and after surgery helps healing and can limit problems. . A way of improving blood sugar control is eating a healthy diet by: o  Eating less sugar and carbohydrates o  Increasing activity/exercise o  Talking with your doctor about reaching your blood sugar goals . High blood sugars (greater than 180 mg/dL) can raise your risk of infections and slow your recovery, so you will need to focus on controlling your diabetes during the weeks before surgery. . Make sure that the doctor who takes care of your diabetes knows about your planned surgery including the date and location.  How do I manage my blood sugar before surgery? . Check your blood sugar at least 4 times a day, starting 2 days before surgery, to make sure that the level is not too high or low. o Check your blood sugar the morning of your surgery when you wake up and every 2 hours until you get to the Short Stay unit. . If your blood sugar is less than 70 mg/dL, you will need to treat for low blood sugar: o Do not take insulin. o Treat a low blood sugar (less than 70 mg/dL) with  cup of clear juice (cranberry or apple), 4 glucose tablets, OR glucose gel. Recheck blood sugar in 15 minutes after treatment (to make sure it is greater than 70 mg/dL). If your blood sugar is not greater than 70 mg/dL on recheck, call [phone] o  for further instructions. . Report  your blood sugar to the short stay nurse when you get to Short Stay.  . If you are admitted to the hospital after surgery: o Your blood sugar will be checked by the staff and you will probably be given insulin after surgery (instead of oral diabetes medicines) to make sure you have good blood sugar levels. o The goal for blood sugar control after surgery is 80-180 mg/dL.     Do not wear  jewelry, make-up or nail polish.  Do not wear lotions, powders, or perfumes, or deodorant.  Do not shave 48 hours prior to surgery.    Do not bring valuables to the hospital.  [facility] is not responsible for any belongings or valuables.  Hearing aids, eyeglasses, contacts, dentures, partials or bridgework may not be worn into surgery.  Leave your suitcase in the car.  After surgery it may be brought to your room.  For patients admitted to the hospital, discharge time will be determined by your treatment team.  Patients discharged the day of surgery will not be allowed to drive home.    Special instructions:   - Preparing For Surgery  Before surgery, you can play an important role. Because skin is not sterile, your skin needs to be as free of germs as possible. You can reduce the number of germs on your skin by washing with CHG (chlorahexidine gluconate) Soap before surgery.  CHG is an antiseptic cleaner which kills germs and bonds with the skin to continue killing germs even after washing.  Please do not use if you have an allergy to CHG or antibacterial soaps. If your skin becomes reddened/irritated stop using the CHG.  Do not shave (including legs and underarms) for at least 48 hours prior to first CHG shower. It is OK to shave your face.  Please follow these instructions carefully.   1. Shower the NIGHT BEFORE SURGERY and the MORNING OF SURGERY with CHG.   2. If you chose to wash your hair, wash your hair first as usual with your normal shampoo.  3. After you shampoo, rinse your hair and body thoroughly to remove the shampoo.  4. Use CHG as you would any other liquid soap. You can apply CHG directly to the skin and wash gently with a scrungie or a clean washcloth.   5. Apply the CHG Soap to your body ONLY FROM THE NECK DOWN.  Do not use on open wounds or open sores. Avoid contact with your eyes, ears, mouth and genitals (private parts). Wash Face and genitals (private  parts)  with your normal soap.  6. Wash thoroughly, paying special attention to the area where your surgery will be performed.  7. Thoroughly rinse your body with warm water from the neck down.  8. DO NOT shower/wash with your normal soap after using and rinsing off the CHG Soap.  9. Pat yourself dry with a CLEAN TOWEL.  10. Wear CLEAN PAJAMAS to bed the night before surgery, wear comfortable clothes the morning of surgery  11. Place CLEAN SHEETS on your bed the night of your first shower and DO NOT SLEEP WITH PETS.    Day of Surgery: Shower as stated above. Do not apply any deodorants/lotions. Please wear clean clothes to the hospital/surgery center.        Please read over the following fact sheets that you were given.

## 2017-11-29 NOTE — Telephone Encounter (Signed)
Call and see what her actual allergy is, will probably use both vanc and ancef

## 2017-11-29 NOTE — Progress Notes (Signed)
PCP - Dr. Yong Channel Cardiologist - Dr. Ellyn Hack Endocrinologist - Dr. Lissa Merlin Pulmonologist - Dr. Casper Harrison  Chest x-ray - n/a EKG - 09/20/2017 Stress Test - 03/09/2013 ECHO - 09/02/2014 Cardiac Cath - 03/11/2017  Sleep Study - patient denies  Fasting Blood Sugar - 150's Checks Blood Sugar 3 times a day  Aspirin Instructions: patient instructed to contact prescribing physician regarding when to stop ASA.  Anesthesia review: yes, patient has a pulmonary history  Patient denies shortness of breath, fever, cough and chest pain at PAT appointment   Patient verbalized understanding of instructions that were given to them at the PAT appointment. Patient was also instructed that they will need to review over the PAT instructions again at home before surgery.

## 2017-11-30 ENCOUNTER — Encounter: Payer: Self-pay | Admitting: Physician Assistant

## 2017-11-30 ENCOUNTER — Encounter: Payer: Self-pay | Admitting: Endocrinology

## 2017-11-30 ENCOUNTER — Ambulatory Visit: Payer: Self-pay

## 2017-11-30 ENCOUNTER — Ambulatory Visit (INDEPENDENT_AMBULATORY_CARE_PROVIDER_SITE_OTHER): Payer: BLUE CROSS/BLUE SHIELD | Admitting: Physician Assistant

## 2017-11-30 ENCOUNTER — Ambulatory Visit (INDEPENDENT_AMBULATORY_CARE_PROVIDER_SITE_OTHER): Payer: BLUE CROSS/BLUE SHIELD | Admitting: Endocrinology

## 2017-11-30 VITALS — BP 140/68 | HR 73 | Temp 97.8°F | Ht 66.0 in | Wt 248.5 lb

## 2017-11-30 VITALS — BP 150/78 | HR 78 | Wt 248.4 lb

## 2017-11-30 DIAGNOSIS — E1129 Type 2 diabetes mellitus with other diabetic kidney complication: Secondary | ICD-10-CM | POA: Diagnosis not present

## 2017-11-30 DIAGNOSIS — I10 Essential (primary) hypertension: Secondary | ICD-10-CM

## 2017-11-30 DIAGNOSIS — Z794 Long term (current) use of insulin: Secondary | ICD-10-CM | POA: Diagnosis not present

## 2017-11-30 MED ORDER — INSULIN ASPART 100 UNIT/ML FLEXPEN: 30.0000 [IU] | PEN_INJECTOR | Freq: Three times a day (TID) | SUBCUTANEOUS | 5 refills | Status: DC

## 2017-11-30 MED ORDER — BLOOD PRESSURE MONITOR KIT: 1.0000 | PACK | Freq: Once | 0 refills | Status: AC

## 2017-11-30 MED ORDER — TELMISARTAN 20 MG PO TABS: 20.0000 mg | ORAL_TABLET | Freq: Every day | ORAL | 1 refills | Status: DC

## 2017-11-30 NOTE — Progress Notes (Signed)
Subjective:    Patient ID: [name], female    DOB: [birth year], 59 y.o.   MRN: [id number]  HPI Pt returns for f/u of diabetes mellitus: DM type: Insulin-requiring type 2 Dx'ed: 2248 Complications: polyneuropathy, renal insuff, and mild CAD Therapy: insulin since 2006, and 2 oral meds.  GDM: never DKA: never Severe hypoglycemia: never Pancreatitis: never Pancreatic imaging: CT (2015) showed fatty atrophy.  Other: she takes multiple daily injections; she intermittently takes prednisone for sarcoidosis.  Interval history: she will have right TKR soon.  no cbg record, but states cbg's are in the 200's.  There is no trend throughout the day.  Prednisone is 5 mg qd. pt states she feels well in general.  Past Medical History:  Diagnosis Date  . Abnormal SPEP 04/17/2014  . Acute left ankle pain 01/26/2017  . ANEMIA-UNSPECIFIED 09/18/2009  . Anxiety   . CHF (congestive heart failure) ([place])   . Chronic diastolic heart failure, NYHA class 2 (HCC)    LVEDP roughly 20% by cath  . COPD (chronic obstructive pulmonary disease) ([place])   . Depression   . DIABETES MELLITUS, TYPE II 08/21/2006  . Diabetic osteomyelitis ([place]) 05/29/2015  . Fracture of 5th metatarsal    non union  . GERD 08/21/2006  . GOUT 08/20/2010  . Hx of umbilical hernia repair   . HYPERLIPIDEMIA 08/21/2006  . HYPERTENSION 08/21/2006  . Infection of wound due to methicillin resistant Staphylococcus aureus (MRSA)   . Internal hemorrhoids   . Multiple allergies 10/14/2016  . OBESITY 06/04/2009  . Onychomycosis 10/27/2015  . Osteomyelitis of left foot ([place]) 05/29/2015  . Pulmonary sarcoidosis ([place])    Followed locally by pulmonology, but also by Dr. Casper Harrison at [facility]  . Right knee pain 01/26/2017  . Vocal cord dysfunction   . Wears partial dentures     Past Surgical History:  Procedure Laterality Date  . ABDOMINAL HYSTERECTOMY    . APPENDECTOMY    . BLADDER SUSPENSION  11/11/2011   Procedure:  TRANSVAGINAL TAPE (TVT) PROCEDURE;  Surgeon: Olga Millers, MD;  Location: [facility];  Service: Gynecology;  Laterality: N/A;  . CARDIAC CATHETERIZATION  07/2010   LVEF 50-55% WITH VERY MILD GLOBAL HYPOKINESIA; ESSENTIALLY NORMAL CORONARY ARTERIES; NORMAL LV FUNCTION  . CAROTIDS  02/18/11   CAROTID DUPLEX; VERTEBRALS ARE PATENT WITH ANTEGRADE FLOW. ICA/CCA RATIO 1.61 ON RIGHT AND 0.75 ON LEFT  . CHOLECYSTECTOMY  1984  . CYSTOSCOPY  11/11/2011   Procedure: CYSTOSCOPY;  Surgeon: Olga Millers, MD;  Location: [facility];  Service: Gynecology;  Laterality: N/A;  . DOPPLER ECHOCARDIOGRAPHY  02/12/2013   LV FUNCTION, SIZE NORMAL; MILD CONCENTRIC LVH; EST EF 55-65%; WALL MOTION NORMAL  . EXTUBATION (ENDOTRACHEAL) IN OR N/A 09/23/2017   Procedure: EXTUBATION (ENDOTRACHEAL) IN OR;  Surgeon: Helayne Seminole, MD;  Location: [place];  Service: ENT;  Laterality: N/A;  . FIBEROPTIC LARYNGOSCOPY AND TRACHEOSCOPY N/A 09/23/2017   Procedure: FLEXIBLE FIBEROPTIC LARYNGOSCOPY;  Surgeon: Helayne Seminole, MD;  Location: [place];  Service: ENT;  Laterality: N/A;  . FRACTURE SURGERY     foot  . HERNIA REPAIR    . I&D EXTREMITY Left 06/27/2015   Procedure: Partial Excision Left Calcaneus, Place Antibiotic Beads, and Wound VAC;  Surgeon: Newt Minion, MD;  Location: [place];  Service: Orthopedics;  Laterality: Left;  . KNEE ARTHROSCOPY     right  . LEFT AND RIGHT HEART CATHETERIZATION WITH CORONARY ANGIOGRAM N/A 04/23/2013   Procedure: LEFT  AND RIGHT HEART CATHETERIZATION WITH CORONARY ANGIOGRAM;  Surgeon: Leonie Man, MD;  Location: [facility] C[facility];  Service: Cardiovascular;  Laterality: N/A;  . LEFT HEART CATH AND CORONARY ANGIOGRAPHY N/A 03/11/2017   Procedure: Left Heart Cath and Coronary Angiography;  Surgeon: Sherren Mocha, MD;  Location: [place] [facility];  Service: Cardiovascular;  Laterality: N/A;  . LexiScan Myoview  03/09/2013   EF 50%; NORMAL MYOCARDIAL PERFUSION STUDY - breast attenuation  . METATARSAL  OSTEOTOMY WITH OPEN REDUCTION INTERNAL FIXATION (ORIF) METATARSAL WITH FUSION Left 04/09/2014   Procedure: LEFT FOOT FRACTURE OPEN TREATMENT METATARSAL INCLUDES INTERNAL FIXATION EACH;  Surgeon: Lorn Junes, MD;  Location: [place];  Service: Orthopedics;  Laterality: Left;  . NISSEN FUNDOPLICATION  8588  . Right and left CARDIAC CATHETERIZATION  04/23/2013   Angiographic normal coronaries; LVEDP 20 mmHg, PCWP 12-14 mmHg, RAP 12 mmHg.; Fick CO/CI 4.9/2.2  . TOTAL KNEE ARTHROPLASTY Right 06/29/2017   Procedure: RIGHT TOTAL KNEE ARTHROPLASTY;  Surgeon: Newt Minion, MD;  Location: [place];  Service: Orthopedics;  Laterality: Right;  . TRACHEOSTOMY TUBE PLACEMENT N/A 09/20/2017   Procedure: AWAKE INTUBATION WITH ANESTHESIA WITH VIDEO ASSISTANCE;  Surgeon: Helayne Seminole, MD;  Location: [place];  Service: ENT;  Laterality: N/A;  . TUBAL LIGATION     with reversal in 1994  . VENTRAL HERNIA REPAIR      Social History   Socioeconomic History  . Marital status: Married    Spouse name: ETHAN KASPERSKI  . Number of children: 2  . Years of education: 23  . Highest education level: Not on file  Social Needs  . Financial resource strain: Not on file  . Food insecurity - worry: Not on file  . Food insecurity - inability: Not on file  . Transportation needs - medical: Not on file  . Transportation needs - non-medical: Not on file  Occupational History  . Occupation: DISABLED  Tobacco Use  . Smoking status: Never Smoker  . Smokeless tobacco: Never Used  Substance and Sexual Activity  . Alcohol use: No    Alcohol/week: 0.0 oz  . Drug use: No  . Sexual activity: Yes    Birth control/protection: Surgical  Other Topics Concern  . Not on file  Social History Narrative   Married 1994. 2 sons who both live close and 1 grandson.       Disability due to sarcoidosis. 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 albuterol (PROVENTIL HFA;VENTOLIN HFA) 108 (90 Base) MCG/ACT inhaler Inhale 1-2 puffs into the lungs every 6 (six) hours as needed for wheezing or shortness of breath. 8 g 5  . allopurinol (ZYLOPRIM) 100 MG tablet Take 100 mg by mouth 2 (two) times daily.     [name] amoxicillin-clavulanate (AUGMENTIN) 875-125 MG tablet Take 1 tablet by mouth 2 (two) times daily. 20 tablet 0  . aspirin EC 81 MG tablet Take 81 mg by mouth daily.     [name] atorvastatin (LIPITOR) 40 MG tablet TAKE 1 TAB BY MOUTH ONCE DAILY (Patient taking differently: Take 40 mg by mouth once a day) 90 tablet 3  . BAYER MICROLET LANCETS lancets Use as instructed to check sugar 3 times daily 300 each 5  .  benzonatate (TESSALON) 200 MG capsule Take 200 mg by mouth 3 (three) times daily as needed for cough.     . Blood Glucose Monitoring Suppl (BAYER CONTOUR MONITOR) w/Device KIT Use daily to check sugar 1 kit 0  . buPROPion (WELLBUTRIN XL) 300 MG 24 hr tablet TAKE 1 TABLET (300 MG TOTAL) BY MOUTH DAILY. 90 tablet 3  . Calcium Citrate 200 MG TABS Take 200 mg by mouth daily.    . carvedilol (COREG) 25 MG tablet Take 1 tablet (25 mg total) by mouth 2 (two) times daily with a meal. 180 tablet 3  . Cholecalciferol (VITAMIN D) 2000 units tablet Take 2,000 Units by mouth daily.    . Cyanocobalamin (VITAMIN B-12 PO) Take 2,500 mcg by mouth daily.     [name] dexlansoprazole (DEXILANT) 60 MG capsule Take 1 capsule (60 mg total) by mouth daily. 30 capsule 1  . diclofenac sodium (VOLTAREN) 1 % GEL APPLY 2 GRAMS TOPICALLY 4 TIMES A DAY (Patient taking differently: APPLY 2 GRAMS TOPICALLY 4 TIMES A DAY AS NEEDED FOR KNEE PAIN) 100 g 0  . diltiazem (CARDIZEM CD) 120 MG 24 hr capsule Take 1 capsule (120 mg total) by mouth daily. 90 capsule 3  . fenofibrate (TRICOR) 145 MG tablet Take 1 tablet (145  mg total) by mouth daily. NEED OV. 30 tablet 0  . fluticasone (FLONASE) 50 MCG/ACT nasal spray Place 2 sprays into both nostrils daily. (Patient taking differently: Place 2 sprays into both nostrils daily as needed for allergies. ) 48 g 3  . fluticasone furoate-vilanterol (BREO ELLIPTA) 200-25 MCG/INH AEPB Inhale 1 puff into the lungs daily.    . furosemide (LASIX) 40 MG tablet Take 1 tablet (40 mg total) by mouth 2 (two) times daily. 180 tablet 1  . gabapentin (NEURONTIN) 100 MG capsule TAKE 1 CAPSULE BY MOUTH THREE TIMES A DAY (Patient taking differently: TAKE 1 CAPSULE BY MOUTH TWO TIMES A DAY) 90 capsule 0  . glucose blood (BAYER CONTOUR TEST) test strip Use as instructed to check sugar 3 times daily 300 each 5  . Insulin Glargine (TOUJEO SOLOSTAR) 300 UNIT/ML SOPN Inject 40 Units into the skin at bedtime. 5 pen 11  . Insulin Pen Needle 33G X 4 MM MISC 1 each by Does not apply route 4 (four) times daily. 200 each 11  . LORazepam (ATIVAN) 0.5 MG tablet Take 0.5 mg by mouth every 12 (twelve) hours as needed for anxiety.    . metFORMIN (GLUCOPHAGE) 1000 MG tablet Take 1 tablet (1,000 mg total) by mouth 2 (two) times daily with a meal. 180 tablet 0  . nitroGLYCERIN (NITROSTAT) 0.4 MG SL tablet Place 1 tablet (0.4 mg total) under the tongue every 5 (five) minutes as needed for chest pain. Reported on 01/05/2016 100 tablet 0  . ondansetron (ZOFRAN-ODT) 4 MG disintegrating tablet Take 4 mg by mouth every 8 (eight) hours as needed for nausea or vomiting.     [name] PARoxetine Mesylate (BRISDELLE) 7.5 MG CAPS Take 7.5 mg by mouth daily.    . Potassium Chloride ER 20 MEQ TBCR Take 1 tablet by mouth 3 (three) times daily. (Patient taking differently: Take 1.5 tablets by mouth 2 (two) times daily. ) 90 tablet 3  . predniSONE (DELTASONE) 5 MG tablet Take 5 mg by mouth daily with breakfast.    . triamcinolone cream (KENALOG) 0.1 % Apply 1 application topically daily as needed (for irritation).     . venlafaxine XR  (EFFEXOR-XR) 75 MG 24 hr  capsule Take 2 capsules (150 mg total) by mouth at bedtime. (Patient taking differently: Take 75 mg by mouth 2 (two) times daily. )     No current facility-administered medications on file prior to visit.     Allergies  Allergen Reactions  . Methotrexate Other (See Comments)    Peri-oral and buccal lesions  . Vancomycin Other (See Comments)    Nephrotoxicity   . Clindamycin/Lincomycin Nausea And Vomiting and Rash  . Chlorhexidine Itching  . Doxycycline Rash  . Lisinopril Cough  . Teflaro [Ceftaroline] Rash    Tolerates ceftriaxone     Family History  Problem Relation Age of Onset  . Diabetes Father   . Heart attack Father 76  . Coronary artery disease Father   . Heart failure Father   . COPD Mother   . Emphysema Mother   . Asthma Mother   . Heart failure Mother   . Breast cancer Mother   . Heart attack Maternal Grandfather   . Sarcoidosis Maternal Uncle   . Lung cancer Brother   . Diabetes Brother   . Colon cancer Neg Hx     BP (!) 150/78 (BP Location: Right Arm, Patient Position: Sitting, Cuff Size: Normal)   Pulse 78   Wt 248 lb 6.4 oz (112.7 kg)   BMI 40.09 kg/m    Review of Systems She denies hypoglycemia.      Objective:   Physical Exam VITAL SIGNS:  See vs page GENERAL: no distress Pulses: foot pulses are intact bilaterally.   MSK: no deformity of the feet or ankles.  CV: no edema of the legs or ankles Skin:  no ulcer on the feet or ankles.  normal color and temp on the feet and ankles.   Neuro: sensation is intact to touch on the feet and ankles, but decreased from normal.  Ext: There is bilateral onychomycosis of the toenails.     Lab Results  Component Value Date   HGBA1C 8.4 (H) 11/29/2017       Assessment & Plan:  Insulin-requiring type 2 DM, with CAD: worse Renal insuff: we should emphasize novolog over toujeo  Patient Instructions  check your blood sugar 3 times a day.  vary the time of day when you check,  between before the 3 meals, and at bedtime.  also check if you have symptoms of your blood sugar being too high or too low.  please keep a record of the readings and bring it to your next appointment here (or you can bring the meter itself).  You can write it on any piece of paper.  please call us sooner if your blood sugar goes below 70, or if you have a lot of readings over 200. Please increase the novolog to 30-35 units 3 times a day (just before each meal), and: Please continue the same metformin and toujeo.  Please come back for a follow-up appointment in 2 months.

## 2017-11-30 NOTE — Telephone Encounter (Signed)
Please see message below. [name] allergic to vanc

## 2017-11-30 NOTE — Telephone Encounter (Signed)
Use ancef

## 2017-11-30 NOTE — Patient Instructions (Addendum)
check your blood sugar 3 times a day.  vary the time of day when you check, between before the 3 meals, and at bedtime.  also check if you have symptoms of your blood sugar being too high or too low.  please keep a record of the readings and bring it to your next appointment here (or you can bring the meter itself).  You can write it on any piece of paper.  please call us sooner if your blood sugar goes below 70, or if you have a lot of readings over 200. Please increase the novolog to 30-35 units 3 times a day (just before each meal), and: Please continue the same metformin and toujeo.  Please come back for a follow-up appointment in 2 months.

## 2017-11-30 NOTE — Telephone Encounter (Signed)
Pt calling with c/o BP's that are elevated. Pt does not have BP cuff at home and both of these BP's were taken at dr's appts.  BP yesterday 158/70 and today at endocrinologist office was 150/78. [name] currently having headache that are 6/10 and she stated that she takes acetaminophen, but it only eases it and does not take it completely away. Called PCP office to see if pt can see another provider. There was an appt available with Dr. Yong Channel but pt stated that she would not be able to make it on time. Appt made with Inda Coke PA today at 1100.  Reason for Disposition . [7] Systolic BP  >= 829 OR Diastolic >= 90 AND [5] taking BP medications  Answer Assessment - Initial Assessment Questions 1. BLOOD PRESSURE: "What is the blood pressure?" "Did you take at least two measurements 5 minutes apart?"     Yesterday 158/78  This am at endocrinologist office 150/78 2. ONSET: "When did you take your blood pressure?" Yesterday and today Pt has no BP cuff at home 3. HOW: "How did you obtain the blood pressure?" (e.g., visiting nurse, automatic home BP monitor)     Taken at dr's office 4. HISTORY: "Do you have a history of high blood pressure?"     yes 5. MEDICATIONS: "Are you taking any medications for blood pressure?" "Have you missed any doses recently?"     No doses missed takes Coreg and cardiazem 6. OTHER SYMPTOMS: "Do you have any symptoms?" (e.g., headache, chest pain, blurred vision, difficulty breathing, weakness)     Headache pain 6/10 began Monday, forehead takes tylenol eases it but doesn't take it away. No chest pain, no blurred vision, no diff. Breathing or weakness 7. PREGNANCY: "Is there any chance you are pregnant?" "When was your last menstrual period?"     n/a  Protocols used: HIGH BLOOD PRESSURE-A-AH

## 2017-11-30 NOTE — Assessment & Plan Note (Signed)
Recently increased blood pressure, slightly uncontrolled today. Will add back ARB -- discussed with PCP, Dr. Yong Channel. Continue Cardizem 166m, Carvedilol 270mBID, and Lasix 4070mID. Will add Micardis 76m26mollow-up with our office early next week for blood pressure check prior to scheduled procedure on 12/07/17. I also provided patient with a blood pressure log and prescription for blood pressure kit for her to start monitoring her blood pressure on her own. Patient is agreeable to plan. If new or unusual symptoms develop, I discussed with her that she needs to seek medical attention, patient verbalized understanding.

## 2017-11-30 NOTE — Progress Notes (Signed)
Anesthesia Chart Review:  [name] a 57 year old female scheduled for L total knee arthroplasty on 12/07/2017 with Meridee Score, MD  - PCP is Garret Reddish, DO. Last office visit 11/30/17 with Inda Coke, PA - Endocrinologist is Renato Shin, MD. Last office visit 11/30/17 - Cardiologist is [name], MD. Last office visit 11/19/16 - Pulmonologist is Loreta Ave, MD. Last office visit 09/06/17 (notes in care everywhere)  - also sees pulmonology locally in [place]. Last office visit 07/25/17 with Marshell Garfinkel, MD.  Pt was cleared for R TKA (done in 06/2017) at office visit 04/15/17 with Rexene Edison, NP who recommended for that surgery:  1. Short duration of surgery as much as possible and avoid paralytic if possible 3. DVT prophylaxis 4. Aggressive pulmonary toilet with o2, bronchodilatation, and incentive spirometry and early ambulation  PMH includes:  Chronic diastolic HF, HTN, DM, hyperlipidemia, COPD, pulmonary sarcoidosis, anemia.  Never smoker. BMI 40. S/p R TKA 06/29/17. S/p partial excision L calcaneous 06/27/15. S/p ORIF L foot fx 04/09/14.   - Hospitalized 12/11-12/18/18 for epiglottitis, supraglottitis, and acute hypoxemic respiratory failure. She required awake fiberoptic intubation through right nare with 6.5 ETT. She remained intubated until 09/23/17 when she was taken back to the operating room and extubated under TFl examination over a Cook catheter. She did well after extubation and was transferred to a step down unit on 12/15.  - Hospitalized 11/2-11/5/18 for COPD exacerbation   Medications include: albuterol, Augmentin, ASA 81mg , lipitor, carvedilol, Dexilant, diltiazem, fenofibrate, Breo Ellipta, Lasix, Toujeo, metformin,potassium, prednisone  BP (!) 158/67   Pulse 72   Temp 36.7 C   Resp 20   Ht 5\' 6"  (1.676 m)   Wt 247 lb 1.6 oz (112.1 kg)   SpO2 99%   BMI 39.88 kg/m    Preoperative labs reviewed.   - Started on Macrobid 06/20/17.  - HbA1c 8.4,  glucose 75.   1 view CXR 09/24/17: No active disease.  EKG 09/20/17: NSR. Cannot rule out Anterior infarct, age undetermined  Cardiac cath 03/11/17:  1. Angiographically normal left main, RCA, and LCx 2. Minimal nonobstructive irregularity in the LAD 3. Normal LVEDP  Nuclear stress test 03/10/17:  1. There is a moderate-sized stress-induced perfusion defect at the apex as well as ill-defined stress-induced perfusion defect in the lateral. 2. Normal left ventricular wall motion. 3. Left ventricular ejection fraction 55% 4. Non invasive risk stratification: Intermediate  Echo 09/02/14:  - Left ventricle: The cavity size was normal. Wall thickness wasincreased in a pattern of mild LVH. Systolic function was normal.The estimated ejection fraction was in the range of 55% to 60%.Wall motion was normal; there were no regional wall motionabnormalities. Doppler parameters are consistent with abnormalleft ventricular relaxation (grade 1 diastolic dysfunction). - Aortic valve: There was no stenosis. - Mitral valve: Mildly calcified annulus. There was no significantregurgitation. - Left atrium: The atrium was mildly dilated. - Right ventricle: The cavity size was normal. Systolic functionwas normal. - Inferior vena cava: The vessel was normal in size. The respirophasic diameter changes were in the normal range (>= 50%),consistent with normal central venous pressure.  If no changes, I anticipate pt can proceed with surgery as scheduled.   Willeen Cass, FNP-BC [facility]/Anesthesiology Phone: [phone] 11/30/2017 2:25 PM

## 2017-11-30 NOTE — Progress Notes (Signed)
[name] is a 57 y.o. female here for a new problem.  I acted as Education administrator for [employer], PA-C [employer], LPN.  History of Present Illness:   Chief Complaint  Patient presents with  . Hypertension    HPI  [name] presents today with recent elevated blood pressure readings x 2 days and recent onset HA -- started today. Does not have a blood pressure cuff. The HA that she has presently is typical for her, denies that it is the "worst HA of her life", denies vision changes or aura. She was seen yesterday for pre-admission testing for upcoming L total knee arthroscopy planned for 12/07/17 with Dr. Sharol Given. During that visit her BP was 158/67. She had a regularly scheduled appointment with her endocrinologist today, Dr. Renato Shin, and her blood pressure was 150/78. Currently taking Coreg 25 mg BID, Diltiazem 120 mg daily, Lasix 40 mg BID. She was previously on an ARB, losartan, but this was stopped 08/2017 secondary to potential chronic cough contribution. Patient denies chest pain, SOB, blurred vision, dizziness, lower leg swelling. Patient is very compliant with medication. Denies excessive caffeine intake, stimulant usage, excessive alcohol intake, or increase in salt consumption. She denies any significant changes in her weight at this time. Chart review that her weight has been within 2 lb over the past 3 months.  Of note, she does have a cardiologist, Dr. [name], last seen on 11/19/2016, for f/u of her mild diastolic heart failure and hypertensive heart disease with pulmonary sarcoidosis (she was initially seen in 2014 and had cath done.) Most recently, she had a cath in June 2018 for chest pain, abnormal nuclear stress test suggestive of lateral wall ischemia --> findings revealed minimal nonobstructive disease with some irregularities in the LAD. Per chart, she was to follow-up 1 year from last visit with Dr. Allison Quarry visit (Feb 2018) but she has not heard from their  office to re-schedule this.   Wt Readings from Last 10 Encounters:  11/30/17 248 lb 8 oz (112.7 kg)  11/30/17 248 lb 6.4 oz (112.7 kg)  11/29/17 247 lb 1.6 oz (112.1 kg)  11/23/17 246 lb 3.2 oz (111.7 kg)  10/25/17 246 lb (111.6 kg)  10/17/17 246 lb 3.2 oz (111.7 kg)  10/03/17 241 lb 6.4 oz (109.5 kg)  09/26/17 243 lb 2.7 oz (110.3 kg)  08/29/17 251 lb 3.2 oz (113.9 kg)  08/22/17 248 lb 3.2 oz (112.6 kg)     Past Medical History:  Diagnosis Date  . Abnormal SPEP 04/17/2014  . Acute left ankle pain 01/26/2017  . ANEMIA-UNSPECIFIED 09/18/2009  . Anxiety   . CHF (congestive heart failure) ([place])   . Chronic diastolic heart failure, NYHA class 2 (HCC)    LVEDP roughly 20% by cath  . COPD (chronic obstructive pulmonary disease) ([place])   . Depression   . DIABETES MELLITUS, TYPE II 08/21/2006  . Diabetic osteomyelitis ([place]) 05/29/2015  . Fracture of 5th metatarsal    non union  . GERD 08/21/2006  . GOUT 08/20/2010  . Hx of umbilical hernia repair   . HYPERLIPIDEMIA 08/21/2006  . HYPERTENSION 08/21/2006  . Infection of wound due to methicillin resistant Staphylococcus aureus (MRSA)   . Internal hemorrhoids   . Multiple allergies 10/14/2016  . OBESITY 06/04/2009  . Onychomycosis 10/27/2015  . Osteomyelitis of left foot ([place]) 05/29/2015  . Pulmonary sarcoidosis ([place])    Followed locally by pulmonology, but also by Dr. Casper Harrison at [facility]  .  Right knee pain 01/26/2017  . Vocal cord dysfunction   . Wears partial dentures      Social History   Socioeconomic History  . Marital status: Married    Spouse name: [name]  . Number of children: 2  . Years of education: 102  . Highest education level: Not on file  Social Needs  . Financial resource strain: Not on file  . Food insecurity - worry: Not on file  . Food insecurity - inability: Not on file  . Transportation needs - medical: Not on file  . Transportation needs - non-medical: Not on file  Occupational  History  . Occupation: DISABLED  Tobacco Use  . Smoking status: Never Smoker  . Smokeless tobacco: Never Used  Substance and Sexual Activity  . Alcohol use: No    Alcohol/week: 0.0 oz  . Drug use: No  . Sexual activity: Yes    Birth control/protection: Surgical  Other Topics Concern  . Not on file  Social History Narrative   Married 1994. 2 sons who both live close and 1 grandson.       Disability due to sarcoidosis. 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 ABDOMINAL HYSTERECTOMY    . APPENDECTOMY    . BLADDER SUSPENSION  11/11/2011   Procedure: TRANSVAGINAL TAPE (TVT) PROCEDURE;  Surgeon: Olga Millers, MD;  Location: [facility];  Service: Gynecology;  Laterality: N/A;  . CARDIAC CATHETERIZATION  07/2010   LVEF 50-55% WITH VERY MILD GLOBAL HYPOKINESIA; ESSENTIALLY NORMAL CORONARY ARTERIES; NORMAL LV FUNCTION  . CAROTIDS  02/18/11   CAROTID DUPLEX; VERTEBRALS ARE PATENT WITH ANTEGRADE FLOW. ICA/CCA RATIO 1.61 ON RIGHT AND 0.75 ON LEFT  . CHOLECYSTECTOMY  1984  . CYSTOSCOPY  11/11/2011   Procedure: CYSTOSCOPY;  Surgeon: Olga Millers, MD;  Location: [facility];  Service: Gynecology;  Laterality: N/A;  . DOPPLER ECHOCARDIOGRAPHY  02/12/2013   LV FUNCTION, SIZE NORMAL; MILD CONCENTRIC LVH; EST EF 55-65%; WALL MOTION NORMAL  . EXTUBATION (ENDOTRACHEAL) IN OR N/A 09/23/2017   Procedure: EXTUBATION (ENDOTRACHEAL) IN OR;  Surgeon: Helayne Seminole, MD;  Location: [facility];  Service: ENT;  Laterality: N/A;  . FIBEROPTIC LARYNGOSCOPY AND TRACHEOSCOPY N/A 09/23/2017   Procedure: FLEXIBLE FIBEROPTIC LARYNGOSCOPY;  Surgeon: Helayne Seminole, MD;  Location: [facility];  Service: ENT;  Laterality: N/A;  . FRACTURE SURGERY     foot  . HERNIA REPAIR    . I&D EXTREMITY Left 06/27/2015   Procedure: Partial  Excision Left Calcaneus, Place Antibiotic Beads, and Wound VAC;  Surgeon: Newt Minion, MD;  Location: [facility];  Service: Orthopedics;  Laterality: Left;  . KNEE ARTHROSCOPY     right  . LEFT AND RIGHT HEART CATHETERIZATION WITH CORONARY ANGIOGRAM N/A 04/23/2013   Procedure: LEFT AND RIGHT HEART CATHETERIZATION WITH CORONARY ANGIOGRAM;  Surgeon: Leonie Man, MD;  Location[facility] C[facility];  Service: Cardiovascular;  Laterality: N/A;  . LEFT HEART CATH AND CORONARY ANGIOGRAPHY N/A 03/11/2017   Procedure: Left Heart Cath and Coronary Angiography;  Surgeon: Sherren Mocha, MD;  Location: [place] [facility];  Service: Cardiovascular;  Laterality: N/A;  . LexiScan Myoview  03/09/2013   EF 50%; NORMAL MYOCARDIAL PERFUSION STUDY - breast attenuation  . METATARSAL OSTEOTOMY WITH OPEN REDUCTION INTERNAL FIXATION (ORIF)  METATARSAL WITH FUSION Left 04/09/2014   Procedure: LEFT FOOT FRACTURE OPEN TREATMENT METATARSAL INCLUDES INTERNAL FIXATION EACH;  Surgeon: Lorn Junes, MD;  Location: [facility];  Service: Orthopedics;  Laterality: Left;  . NISSEN FUNDOPLICATION  6283  . Right and left CARDIAC CATHETERIZATION  04/23/2013   Angiographic normal coronaries; LVEDP 20 mmHg, PCWP 12-14 mmHg, RAP 12 mmHg.; Fick CO/CI 4.9/2.2  . TOTAL KNEE ARTHROPLASTY Right 06/29/2017   Procedure: RIGHT TOTAL KNEE ARTHROPLASTY;  Surgeon: Newt Minion, MD;  Location: [place];  Service: Orthopedics;  Laterality: Right;  . TRACHEOSTOMY TUBE PLACEMENT N/A 09/20/2017   Procedure: AWAKE INTUBATION WITH ANESTHESIA WITH VIDEO ASSISTANCE;  Surgeon: Helayne Seminole, MD;  Location: [facility];  Service: ENT;  Laterality: N/A;  . TUBAL LIGATION     with reversal in 1994  . VENTRAL HERNIA REPAIR      Family History  Problem Relation Age of Onset  . Diabetes Father   . Heart attack Father 42  . Coronary artery disease Father   . Heart failure Father   . COPD Mother   . Emphysema Mother   . Asthma Mother   . Heart  failure Mother   . Breast cancer Mother   . Heart attack Maternal Grandfather   . Sarcoidosis Maternal Uncle   . Lung cancer Brother   . Diabetes Brother   . Colon cancer Neg Hx     Allergies  Allergen Reactions  . Methotrexate Other (See Comments)    Peri-oral and buccal lesions  . Vancomycin Other (See Comments)    Nephrotoxicity   . Clindamycin/Lincomycin Nausea And Vomiting and Rash  . Chlorhexidine Itching  . Doxycycline Rash  . Lisinopril Cough  . Teflaro [Ceftaroline] Rash    Tolerates ceftriaxone     Current Medications:   Current Outpatient Medications:  .  albuterol (PROVENTIL HFA;VENTOLIN HFA) 108 (90 Base) MCG/ACT inhaler, Inhale 1-2 puffs into the lungs every 6 (six) hours as needed for wheezing or shortness of breath., Disp: 8 g, Rfl: 5 .  allopurinol (ZYLOPRIM) 100 MG tablet, Take 100 mg by mouth 2 (two) times daily. , Disp: , Rfl:  .  amoxicillin-clavulanate (AUGMENTIN) 875-125 MG tablet, Take 1 tablet by mouth 2 (two) times daily., Disp: 20 tablet, Rfl: 0 .  aspirin EC 81 MG tablet, Take 81 mg by mouth daily. , Disp: , Rfl:  .  atorvastatin (LIPITOR) 40 MG tablet, TAKE 1 TAB BY MOUTH ONCE DAILY (Patient taking differently: Take 40 mg by mouth once a day), Disp: 90 tablet, Rfl: 3 .  BAYER MICROLET LANCETS lancets, Use as instructed to check sugar 3 times daily, Disp: 300 each, Rfl: 5 .  benzonatate (TESSALON) 200 MG capsule, Take 200 mg by mouth 3 (three) times daily as needed for cough. , Disp: , Rfl:  .  Blood Glucose Monitoring Suppl (BAYER CONTOUR MONITOR) w/Device KIT, Use daily to check sugar, Disp: 1 kit, Rfl: 0 .  buPROPion (WELLBUTRIN XL) 300 MG 24 hr tablet, TAKE 1 TABLET (300 MG TOTAL) BY MOUTH DAILY., Disp: 90 tablet, Rfl: 3 .  Calcium Citrate 200 MG TABS, Take 200 mg by mouth daily., Disp: , Rfl:  .  carvedilol (COREG) 25 MG tablet, Take 1 tablet (25 mg total) by mouth 2 (two) times daily with a meal., Disp: 180 tablet, Rfl: 3 .  Cholecalciferol  (VITAMIN D) 2000 units tablet, Take 2,000 Units by mouth daily., Disp: , Rfl:  .  Cyanocobalamin (VITAMIN B-12 PO),  Take 2,500 mcg by mouth daily. , Disp: , Rfl:  .  dexlansoprazole (DEXILANT) 60 MG capsule, Take 1 capsule (60 mg total) by mouth daily., Disp: 30 capsule, Rfl: 1 .  diclofenac sodium (VOLTAREN) 1 % GEL, APPLY 2 GRAMS TOPICALLY 4 TIMES A DAY (Patient taking differently: APPLY 2 GRAMS TOPICALLY 4 TIMES A DAY AS NEEDED FOR KNEE PAIN), Disp: 100 g, Rfl: 0 .  diltiazem (CARDIZEM CD) 120 MG 24 hr capsule, Take 1 capsule (120 mg total) by mouth daily., Disp: 90 capsule, Rfl: 3 .  fenofibrate (TRICOR) 145 MG tablet, Take 1 tablet (145 mg total) by mouth daily. NEED OV., Disp: 30 tablet, Rfl: 0 .  fluticasone (FLONASE) 50 MCG/ACT nasal spray, Place 2 sprays into both nostrils daily. (Patient taking differently: Place 2 sprays into both nostrils daily as needed for allergies. ), Disp: 48 g, Rfl: 3 .  fluticasone furoate-vilanterol (BREO ELLIPTA) 200-25 MCG/INH AEPB, Inhale 1 puff into the lungs daily., Disp: , Rfl:  .  furosemide (LASIX) 40 MG tablet, Take 1 tablet (40 mg total) by mouth 2 (two) times daily., Disp: 180 tablet, Rfl: 1 .  gabapentin (NEURONTIN) 100 MG capsule, TAKE 1 CAPSULE BY MOUTH THREE TIMES A DAY (Patient taking differently: TAKE 1 CAPSULE BY MOUTH TWO TIMES A DAY), Disp: 90 capsule, Rfl: 0 .  glucose blood (BAYER CONTOUR TEST) test strip, Use as instructed to check sugar 3 times daily, Disp: 300 each, Rfl: 5 .  insulin aspart (NOVOLOG FLEXPEN) 100 UNIT/ML FlexPen, Inject 30-35 Units into the skin 3 (three) times daily with meals. Inject 25-30 units in the skin 3 times daily with meals, Disp: 30 mL, Rfl: 5 .  Insulin Glargine (TOUJEO SOLOSTAR) 300 UNIT/ML SOPN, Inject 40 Units into the skin at bedtime., Disp: 5 pen, Rfl: 11 .  Insulin Pen Needle 33G X 4 MM MISC, 1 each by Does not apply route 4 (four) times daily., Disp: 200 each, Rfl: 11 .  LORazepam (ATIVAN) 0.5 MG tablet,  Take 0.5 mg by mouth every 12 (twelve) hours as needed for anxiety., Disp: , Rfl:  .  metFORMIN (GLUCOPHAGE) 1000 MG tablet, Take 1 tablet (1,000 mg total) by mouth 2 (two) times daily with a meal., Disp: 180 tablet, Rfl: 0 .  nitroGLYCERIN (NITROSTAT) 0.4 MG SL tablet, Place 1 tablet (0.4 mg total) under the tongue every 5 (five) minutes as needed for chest pain. Reported on 01/05/2016, Disp: 100 tablet, Rfl: 0 .  ondansetron (ZOFRAN-ODT) 4 MG disintegrating tablet, Take 4 mg by mouth every 8 (eight) hours as needed for nausea or vomiting. , Disp: , Rfl:  .  PARoxetine Mesylate (BRISDELLE) 7.5 MG CAPS, Take 7.5 mg by mouth daily., Disp: , Rfl:  .  Potassium Chloride ER 20 MEQ TBCR, Take 1 tablet by mouth 3 (three) times daily. (Patient taking differently: Take 1.5 tablets by mouth 2 (two) times daily. ), Disp: 90 tablet, Rfl: 3 .  predniSONE (DELTASONE) 5 MG tablet, Take 5 mg by mouth daily with breakfast., Disp: , Rfl:  .  triamcinolone cream (KENALOG) 0.1 %, Apply 1 application topically daily as needed (for irritation). , Disp: , Rfl:  .  venlafaxine XR (EFFEXOR-XR) 75 MG 24 hr capsule, Take 2 capsules (150 mg total) by mouth at bedtime. (Patient taking differently: Take 75 mg by mouth 2 (two) times daily. ), Disp: , Rfl:  .  Blood Pressure Monitor KIT, 1 each by Does not apply route once for 1 dose. Patient needs large  cuff., Disp: 1 each, Rfl: 0 .  telmisartan (MICARDIS) 20 MG tablet, Take 1 tablet (20 mg total) by mouth daily., Disp: 30 tablet, Rfl: 1   Review of Systems:   Review of Systems  Negative unless otherwise specified per HPI.  Vitals:   Vitals:   11/30/17 1045 11/30/17 1058  BP: 138/68 140/68  Pulse: 73   Temp: 97.8 F (36.6 C)   TempSrc: Oral   SpO2: 98%   Weight: 248 lb 8 oz (112.7 kg)   Height: 5' 6" (1.676 m)      Body mass index is 40.11 kg/m.  Physical Exam:   Physical Exam  Constitutional: She appears well-developed. She is cooperative.  Non-toxic  appearance. She does not have a sickly appearance. She does not appear ill. No distress.  HENT:  Head: Normocephalic and atraumatic.  Right Ear: Tympanic membrane, external ear and ear canal normal. Tympanic membrane is not erythematous, not retracted and not bulging.  Left Ear: Tympanic membrane, external ear and ear canal normal. Tympanic membrane is not erythematous, not retracted and not bulging.  Nose: Nose normal. Right sinus exhibits no maxillary sinus tenderness and no frontal sinus tenderness. Left sinus exhibits no maxillary sinus tenderness and no frontal sinus tenderness.  Mouth/Throat: Uvula is midline. No posterior oropharyngeal edema or posterior oropharyngeal erythema.  Eyes: Conjunctivae and lids are normal.  Neck: Trachea normal.  Cardiovascular: Normal rate, regular rhythm, S1 normal, S2 normal and normal heart sounds.  Pulmonary/Chest: Effort normal and breath sounds normal. She has no decreased breath sounds. She has no wheezes. She has no rhonchi. She has no rales.  Lymphadenopathy:    She has no cervical adenopathy.  Neurological: She is alert.  Skin: Skin is warm, dry and intact.  Psychiatric: She has a normal mood and affect. Her speech is normal and behavior is normal.  Nursing note and vitals reviewed.   Assessment and Plan:    Problem List Items Addressed This Visit      Cardiovascular and Mediastinum   Essential hypertension - Primary (Chronic)    Recently increased blood pressure, slightly uncontrolled today. Will add back ARB -- discussed with PCP, Dr. Yong Channel. Continue Cardizem 165m, Carvedilol 21mBID, and Lasix 4076mID. Will add Micardis 45m42mollow-up with our office early next week for blood pressure check prior to scheduled procedure on 12/07/17. I also provided patient with a blood pressure log and prescription for blood pressure kit for her to start monitoring her blood pressure on her own. Patient is agreeable to plan. If new or unusual symptoms  develop, I discussed with her that she needs to seek medical attention, patient verbalized understanding.      Relevant Medications   telmisartan (MICARDIS) 20 MG tablet       . Reviewed expectations re: course of current medical issues. . Discussed self-management of symptoms. . Outlined signs and symptoms indicating need for more acute intervention. . Patient verbalized understanding and all questions were answered. . See orders for this visit as documented in the electronic medical record. . Patient received an After-Visit Summary.  CMA or LPN served as scribe during this visit. History, Physical, and Plan performed by medical provider. Documentation and orders reviewed and attested to.  SamaInda Coke-C

## 2017-11-30 NOTE — Patient Instructions (Signed)
It was great to meet you!  Keep an eye on blood pressure - if consistently >140/90, please let me know.  Start the new blood pressure medication.  If any new or unusual side effects please seek medical attention.

## 2017-11-30 NOTE — Telephone Encounter (Signed)
What is her reaction to vancomycin.

## 2017-11-30 NOTE — Telephone Encounter (Signed)
Per chart nephrotoxicity

## 2017-12-01 ENCOUNTER — Ambulatory Visit
Admission: RE | Admit: 2017-12-01 | Discharge: 2017-12-01 | Disposition: A | Discharge status: Home / Self Care | Payer: Medicare Other | Source: Ambulatory Visit | Attending: Obstetrics and Gynecology | Admitting: Obstetrics and Gynecology

## 2017-12-01 ENCOUNTER — Ambulatory Visit: Payer: Medicare Other

## 2017-12-01 ENCOUNTER — Other Ambulatory Visit (INDEPENDENT_AMBULATORY_CARE_PROVIDER_SITE_OTHER): Payer: Self-pay | Admitting: Family

## 2017-12-01 DIAGNOSIS — N6310 Unspecified lump in the right breast, unspecified quadrant: Secondary | ICD-10-CM

## 2017-12-01 DIAGNOSIS — N6489 Other specified disorders of breast: Secondary | ICD-10-CM | POA: Diagnosis not present

## 2017-12-01 DIAGNOSIS — R928 Other abnormal and inconclusive findings on diagnostic imaging of breast: Secondary | ICD-10-CM | POA: Diagnosis not present

## 2017-12-01 NOTE — Telephone Encounter (Signed)
To call

## 2017-12-02 ENCOUNTER — Other Ambulatory Visit: Payer: Self-pay | Admitting: Endocrinology

## 2017-12-02 MED ORDER — INSULIN ASPART 100 UNIT/ML FLEXPEN: 30.0000 [IU] | PEN_INJECTOR | Freq: Three times a day (TID) | SUBCUTANEOUS | 5 refills | Status: DC

## 2017-12-05 ENCOUNTER — Ambulatory Visit: Payer: BLUE CROSS/BLUE SHIELD | Admitting: Family Medicine

## 2017-12-06 ENCOUNTER — Encounter: Payer: Self-pay | Admitting: Family Medicine

## 2017-12-06 ENCOUNTER — Ambulatory Visit (INDEPENDENT_AMBULATORY_CARE_PROVIDER_SITE_OTHER): Payer: BLUE CROSS/BLUE SHIELD | Admitting: Family Medicine

## 2017-12-06 VITALS — BP 122/66 | HR 73 | Temp 98.2°F | Ht 66.0 in | Wt 247.6 lb

## 2017-12-06 DIAGNOSIS — B9689 Other specified bacterial agents as the cause of diseases classified elsewhere: Secondary | ICD-10-CM | POA: Diagnosis not present

## 2017-12-06 DIAGNOSIS — I10 Essential (primary) hypertension: Secondary | ICD-10-CM | POA: Diagnosis not present

## 2017-12-06 DIAGNOSIS — J329 Chronic sinusitis, unspecified: Secondary | ICD-10-CM

## 2017-12-06 IMAGING — DX DG ANKLE COMPLETE 3+V*L*
3 series · 3 of 3 positions shown · non-contrast
Comparison: None.

CLINICAL DATA: Lateral left ankle pain. History of postsurgical
infection of the left ankle/foot two years ago.

EXAM:
LEFT ANKLE COMPLETE - 3+ VIEW

[dg ankle complete left (1 of 3)]
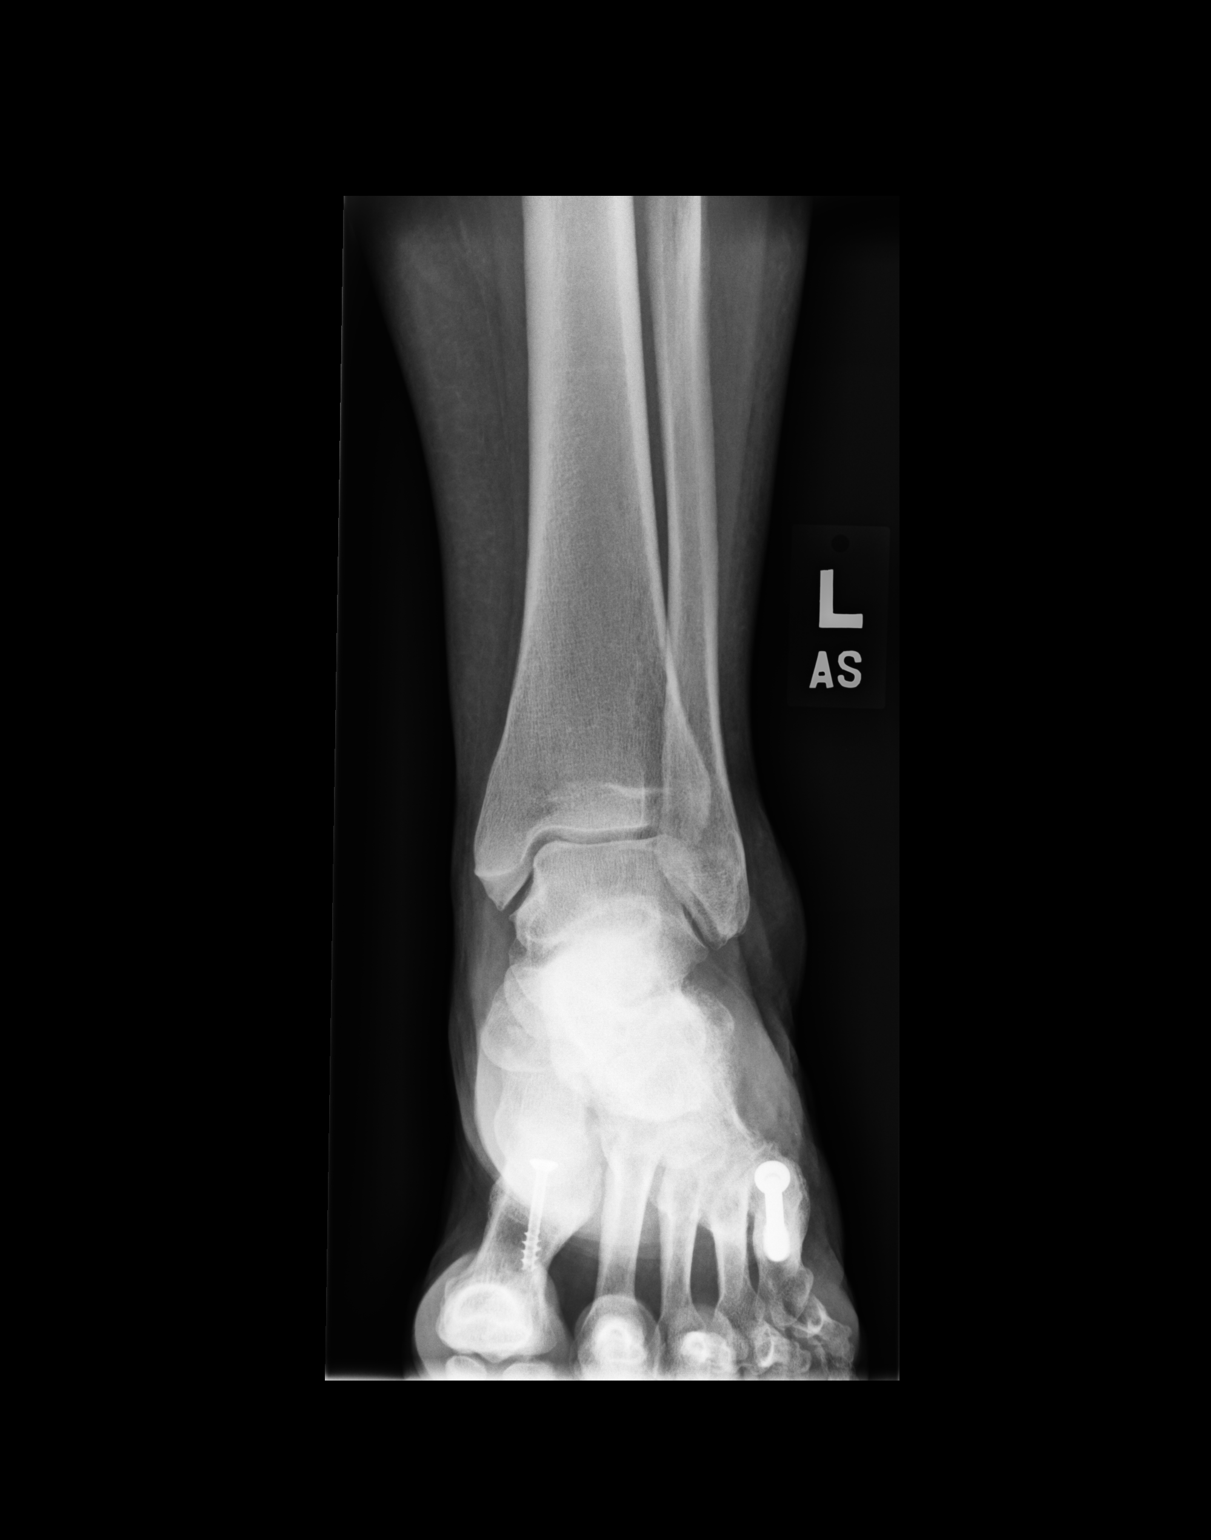

[dg ankle complete left (2 of 3)]
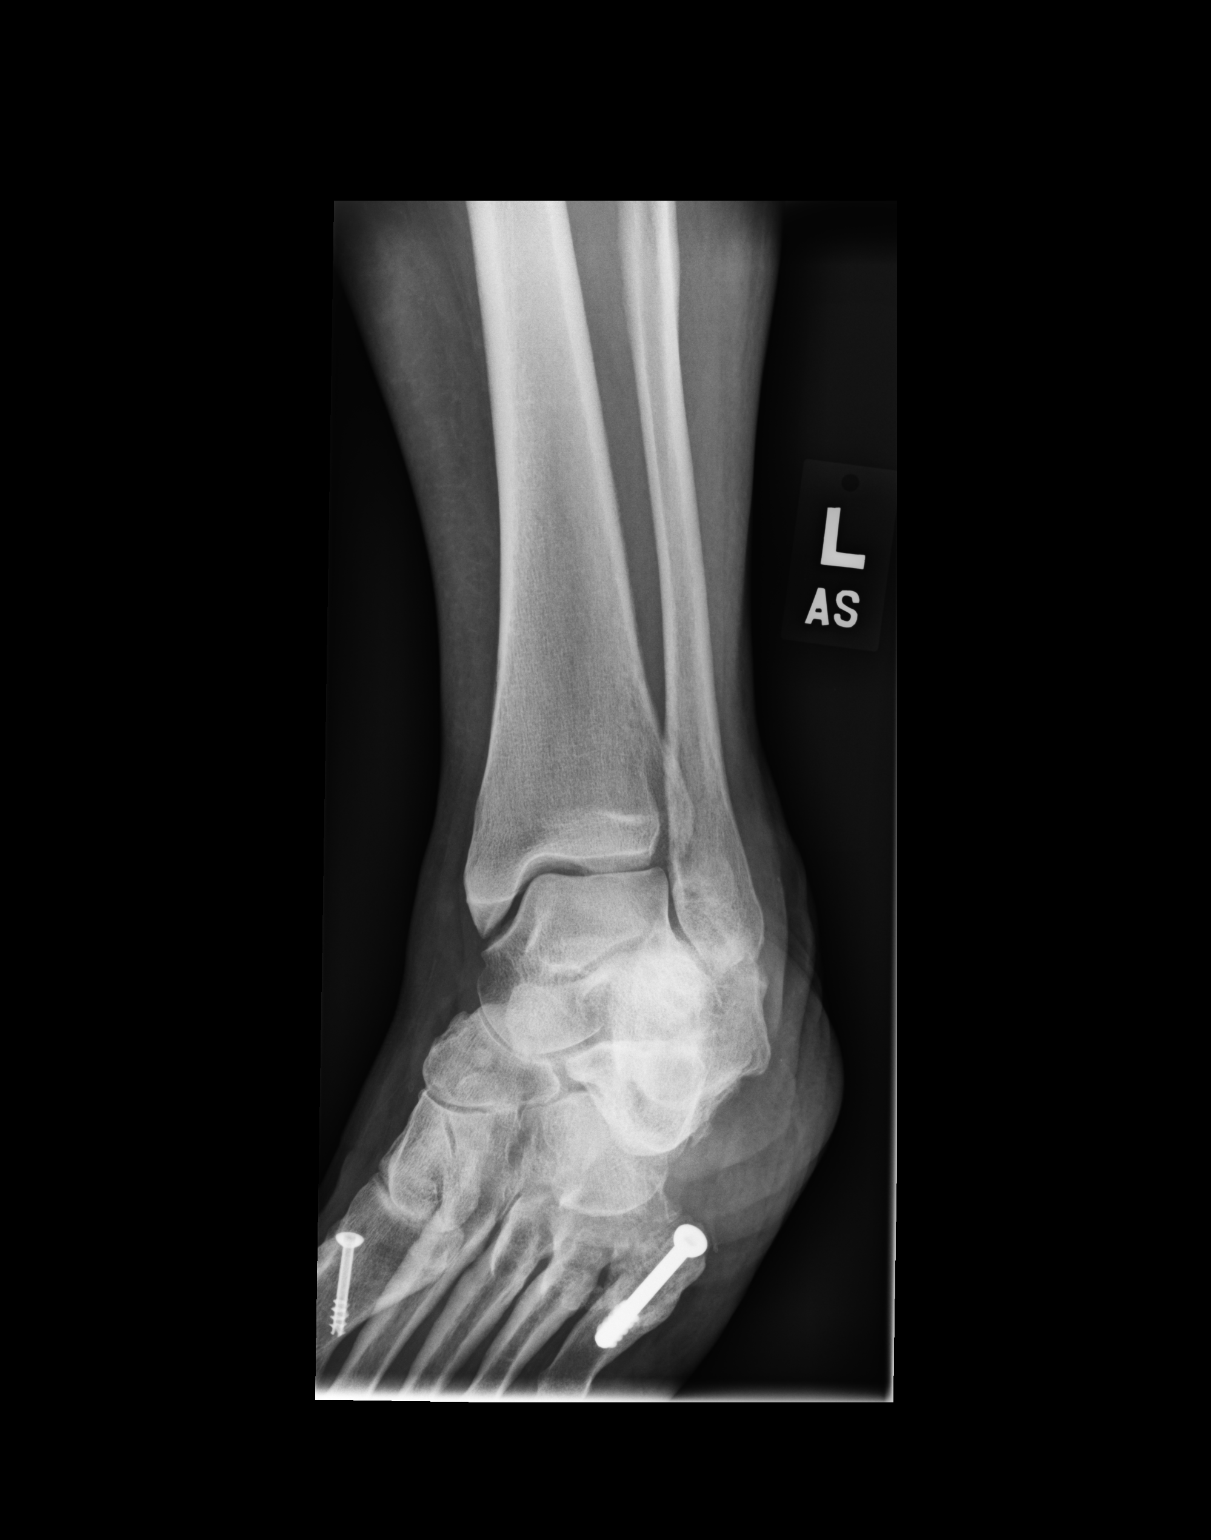

[dg ankle complete left (3 of 3)]
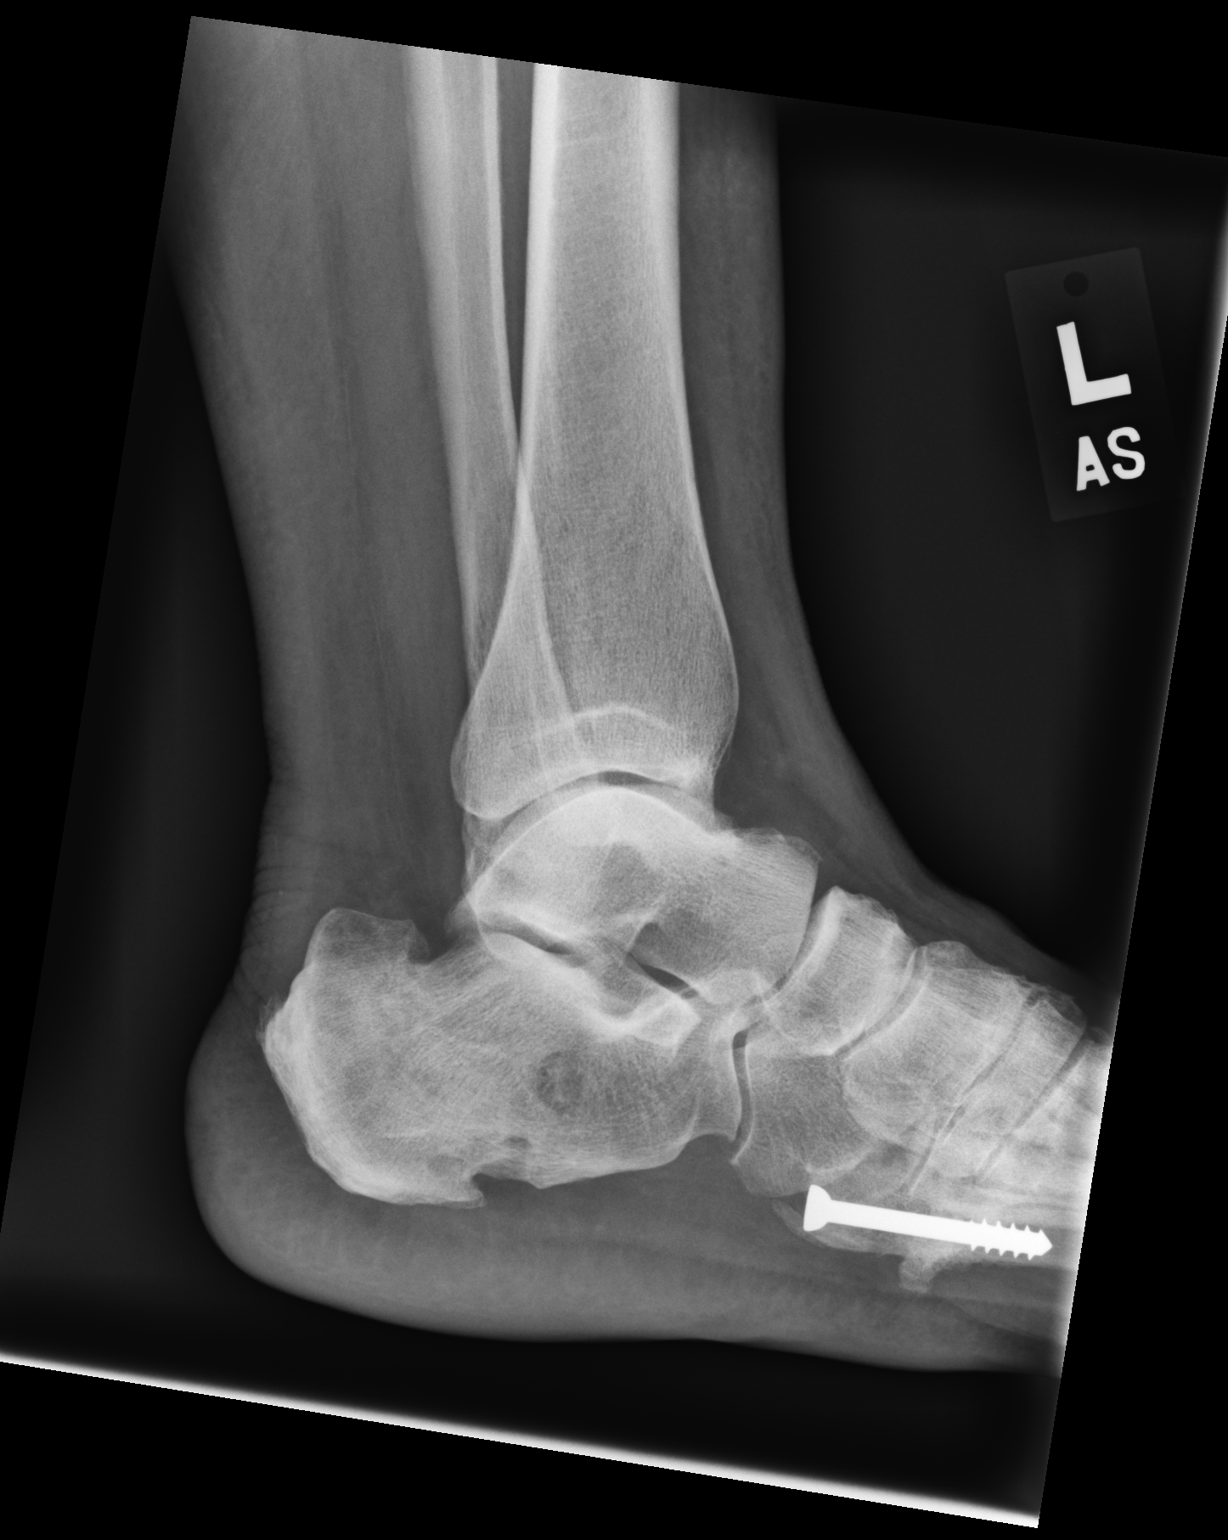

[3 of 3 positions shown; findings below may reference images not displayed]

FINDINGS: No acute fracture, subluxation or dislocation identified.

Calcaneal deformity and surgical screws within the first and fifth
metatarsals again noted without change.

Lateral soft tissue swelling is present.

The joint spaces are otherwise unremarkable.
IMPRESSION: Lateral soft tissue swelling without acute bony abnormality.

## 2017-12-06 IMAGING — DX DG FOOT COMPLETE 3+V*L*
3 series · 3 of 3 positions shown · non-contrast
Comparison: 01/26/2016 radiographs and prior exams.

CLINICAL DATA: History of hardware complicating wound infection.
Subsequent encounter.

EXAM:
LEFT FOOT - COMPLETE 3+ VIEW

[dg foot complete left (1 of 3)]
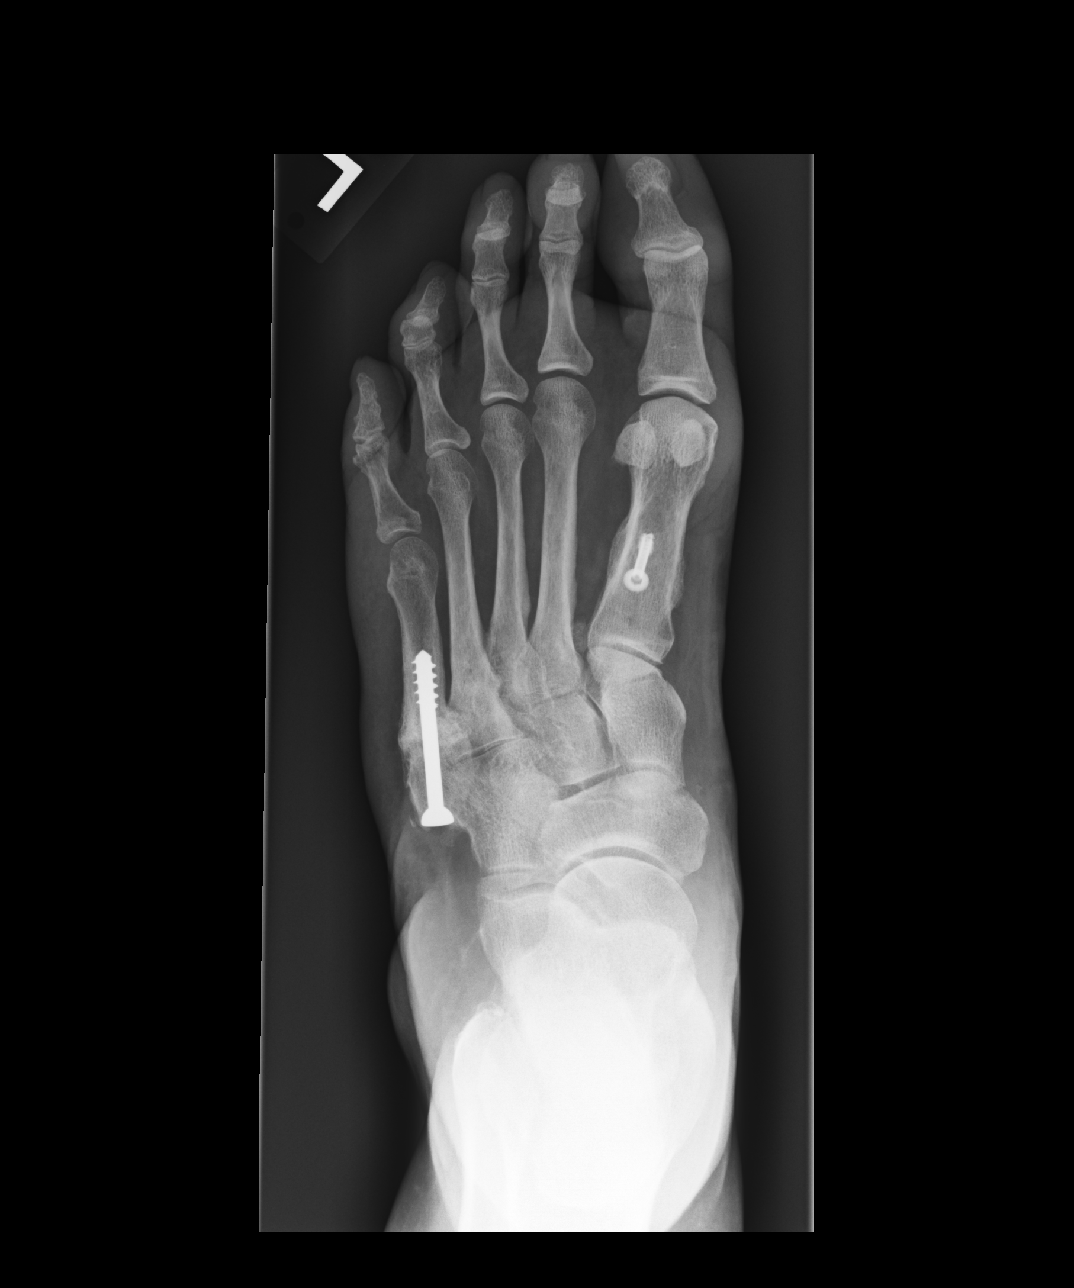

[dg foot complete left (2 of 3)]
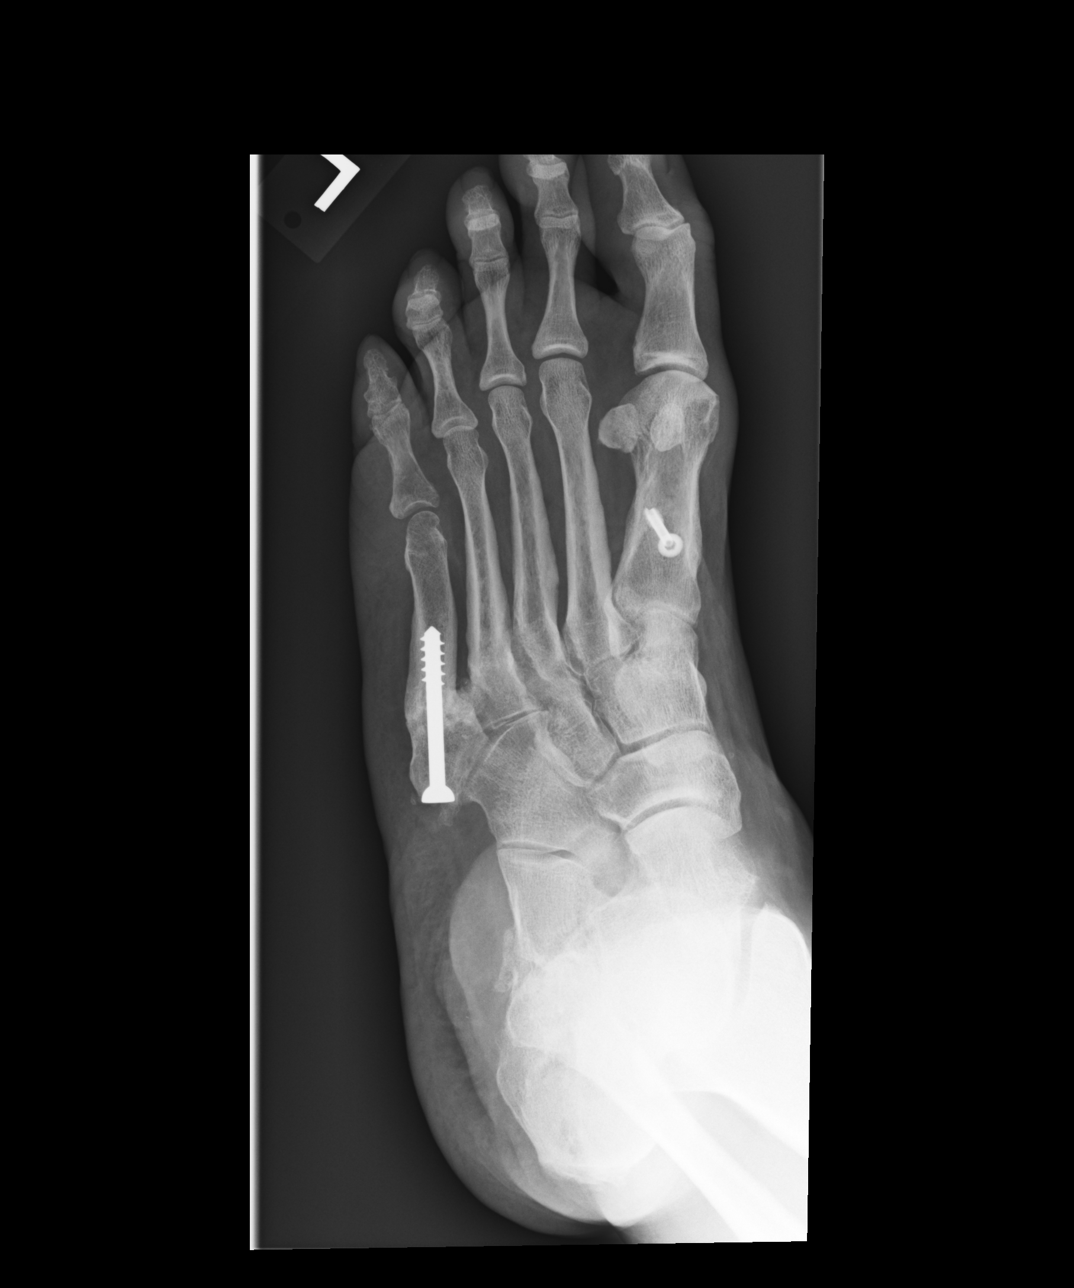

[dg foot complete left (3 of 3)]
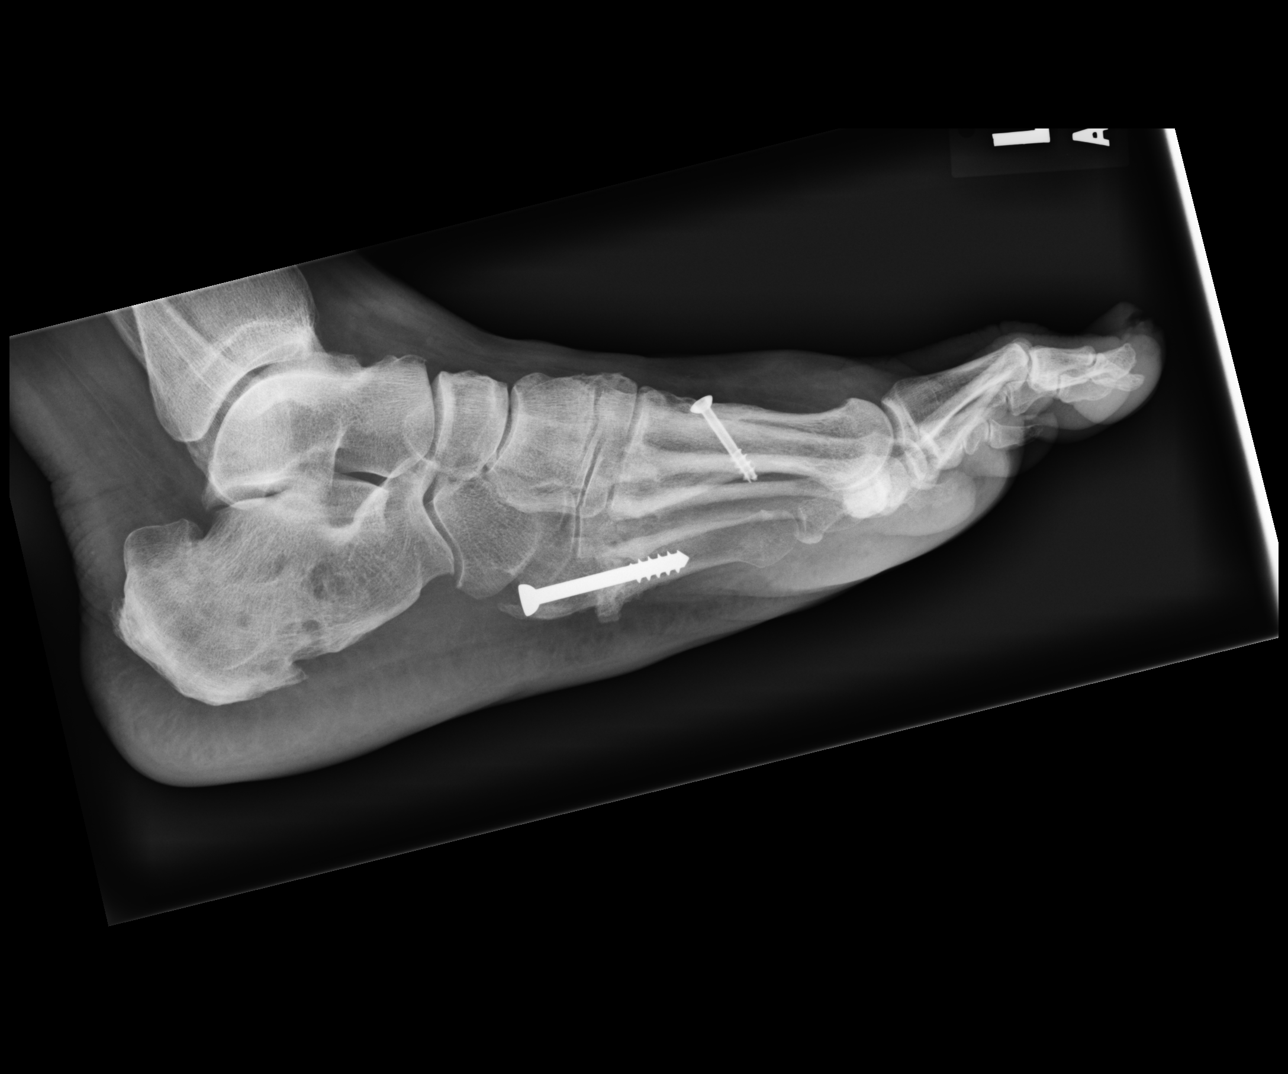

[3 of 3 positions shown; findings below may reference images not displayed]

FINDINGS: Surgical screws within the first metatarsal and proximal fifth
metatarsal noted.

There is no evidence of acute fracture, subluxation or dislocation.

No radiographic evidence of acute osteomyelitis noted.

Chronic calcaneal changes are again noted.
IMPRESSION: No acute abnormality.

Surgical screws within first and fifth metatarsals without interval
change.

## 2017-12-06 NOTE — Patient Instructions (Signed)
Blood pressure looks much better on repeat  Glad the sinuses cleared up fully.   Best of luck with your surgery

## 2017-12-06 NOTE — Assessment & Plan Note (Signed)
S: controlled on cartia XT 120mg , coreg 25mg  BID, lasix 40mg  BID with recent addition of telmisartan 20mg . Home #s slightly high in 140s but with controlled diatolics BP Readings from Last 3 Encounters:  12/06/17 122/66  11/30/17 140/68  11/30/17 (!) 150/78  A/P: We discussed blood pressure goal of <140/90. Continue current meds:  Doing well with recent telmisartan addition.

## 2017-12-06 NOTE — Anesthesia Preprocedure Evaluation (Addendum)
Anesthesia Evaluation  Patient identified by MRN, date of birth, ID band Patient awake    Reviewed: Allergy & Precautions, H&P , NPO status , Patient's Chart, lab work & pertinent test results, reviewed documented beta blocker date and time   Airway Mallampati: II  TM Distance: >3 FB Neck ROM: Full    Dental no notable dental hx. (+) Edentulous Upper, Edentulous Lower   Pulmonary sleep apnea , COPD,  COPD inhaler,  Sarcoidosis   Pulmonary exam normal breath sounds clear to auscultation       Cardiovascular hypertension, Pt. on medications and Pt. on home beta blockers +CHF  Normal cardiovascular exam Rhythm:Regular Rate:Normal  '18 Cath - Angiographically normal left main, RCA, and LCx. Minimal nonobstructive irregularity in the LAD. Normal LVEDP   Neuro/Psych Anxiety Depression negative neurological ROS     GI/Hepatic Neg liver ROS, GERD  Medicated and Controlled,  Endo/Other  diabetes, Insulin Dependent, Oral Hypoglycemic AgentsMorbid obesity  Renal/GU Renal disease  negative genitourinary   Musculoskeletal  (+) Arthritis , Osteoarthritis,    Abdominal (+) + obese,   Peds  Hematology negative hematology ROS (+) anemia ,   Anesthesia Other Findings   Reproductive/Obstetrics                            Anesthesia Physical  Anesthesia Plan  ASA: III  Anesthesia Plan: Spinal   Post-op Pain Management:  Regional for Post-op pain   Induction:   PONV Risk Score and Plan: 2 and Treatment may vary due to age or medical condition and Propofol infusion  Airway Management Planned: Natural Airway and Simple Face Mask  Additional Equipment: None  Intra-op Plan:   Post-operative Plan:   Informed Consent: I have reviewed the patients History and Physical, chart, labs and discussed the procedure including the risks, benefits and alternatives for the proposed anesthesia with the patient or  authorized representative who has indicated his/her understanding and acceptance.   Dental advisory given  Plan Discussed with: CRNA  Anesthesia Plan Comments:         Anesthesia Quick Evaluation

## 2017-12-06 NOTE — Progress Notes (Signed)
Subjective:  [name] is a 57 y.o. year old very pleasant female patient who presents for/with See problem oriented charting ROS- sinus pressure resolved. No chest pain. No edema. No nasal discharge.    Past Medical History-  Patient Active Problem List   Diagnosis Date Noted  . Obstructive chronic bronchitis without exacerbation ([place]) 09/18/2013    Priority: High  . Chest pain 04/11/2013    Priority: High  . Hypertensive heart disease with chronic diastolic congestive heart failure ([place])     Priority: High  . Sarcoidosis of lung ([place]) 04/10/2007    Priority: High  . Type II diabetes mellitus with renal manifestations ([place]) 08/21/2006    Priority: High  . Epiglottitis     Priority: Medium  . Osteoarthritis of right knee 01/26/2017    Priority: Medium  . CKD (chronic kidney disease) stage 3, GFR 30-59 ml/min ([facility]) 04/27/2015    Priority: Medium  . Fatty liver 09/30/2014    Priority: Medium  . Depression     Priority: Medium  . Gout 08/20/2010    Priority: Medium  . Anemia 09/18/2009    Priority: Medium  . Sleep apnea 04/21/2009    Priority: Medium  . Hyperlipidemia 08/21/2006    Priority: Medium  . Essential hypertension 08/21/2006    Priority: Medium  . Dysphagia 09/20/2017    Priority: Low  . Plantar fasciitis, right 07/13/2017    Priority: Low  . Osteoarthrosis, localized, primary, knee, right     Priority: Low  . Sprain of calcaneofibular ligament of right ankle 06/06/2017    Priority: Low  . Onychomycosis 10/27/2015    Priority: Low  . MRSA (methicillin resistant staph aureus) culture positive 03/27/2015    Priority: Low  . Hot flashes 07/15/2014    Priority: Low  . Abnormal SPEP 04/17/2014    Priority: Low  . Fracture of left leg 04/17/2014    Priority: Low  . Cushingoid side effect of steroids ([place]) 04/17/2014    Priority: Low  . Internal hemorrhoids     Priority: Low  . Preoperative clearance 03/25/2014    Priority: Low  . Solitary pulmonary  nodule, on CT 02/2013 - stable over 2 years in 2015 02/20/2013    Priority: Low  . Morbid obesity ([place]) 06/04/2009    Priority: Low  . GERD 08/21/2006    Priority: Low  . Unilateral primary osteoarthritis, left knee 11/07/2017  . S/P total knee arthroplasty, right 06/29/2017  . Multiple allergies 10/14/2016  . Osteomyelitis of left foot ([place]) 05/29/2015  . Wound infection complicating hardware ([place]) 03/27/2015    Medications- reviewed and updated Current Outpatient Medications  Medication Sig Dispense Refill  . albuterol (PROVENTIL HFA;VENTOLIN HFA) 108 (90 Base) MCG/ACT inhaler Inhale 1-2 puffs into the lungs every 6 (six) hours as needed for wheezing or shortness of breath. 8 g 5  . allopurinol (ZYLOPRIM) 100 MG tablet Take 100 mg by mouth 2 (two) times daily.     [name] amoxicillin-clavulanate (AUGMENTIN) 875-125 MG tablet Take 1 tablet by mouth 2 (two) times daily. 20 tablet 0  . aspirin EC 81 MG tablet Take 81 mg by mouth daily.     [name] atorvastatin (LIPITOR) 40 MG tablet TAKE 1 TAB BY MOUTH ONCE DAILY (Patient taking differently: Take 40 mg by mouth once a day) 90 tablet 3  . BAYER MICROLET LANCETS lancets Use as instructed to check sugar 3 times daily 300 each 5  . benzonatate (TESSALON) 200 MG capsule Take 200 mg by  mouth 3 (three) times daily as needed for cough.     . Blood Glucose Monitoring Suppl (BAYER CONTOUR MONITOR) w/Device KIT Use daily to check sugar 1 kit 0  . buPROPion (WELLBUTRIN XL) 300 MG 24 hr tablet TAKE 1 TABLET (300 MG TOTAL) BY MOUTH DAILY. 90 tablet 3  . Calcium Citrate 200 MG TABS Take 200 mg by mouth daily.    . carvedilol (COREG) 25 MG tablet Take 1 tablet (25 mg total) by mouth 2 (two) times daily with a meal. 180 tablet 3  . Cholecalciferol (VITAMIN D) 2000 units tablet Take 2,000 Units by mouth daily.    . Cyanocobalamin (VITAMIN B-12 PO) Take 2,500 mcg by mouth daily.     [name] dexlansoprazole (DEXILANT) 60 MG capsule Take 1 capsule (60 mg total) by mouth daily.  30 capsule 1  . diclofenac sodium (VOLTAREN) 1 % GEL APPLY 2 GRAMS TOPICALLY 4 TIMES A DAY (Patient taking differently: APPLY 2 GRAMS TOPICALLY 4 TIMES A DAY AS NEEDED FOR KNEE PAIN) 100 g 0  . diltiazem (CARDIZEM CD) 120 MG 24 hr capsule Take 1 capsule (120 mg total) by mouth daily. 90 capsule 3  . fenofibrate (TRICOR) 145 MG tablet Take 1 tablet (145 mg total) by mouth daily. NEED OV. 30 tablet 0  . fluticasone (FLONASE) 50 MCG/ACT nasal spray Place 2 sprays into both nostrils daily. (Patient taking differently: Place 2 sprays into both nostrils daily as needed for allergies. ) 48 g 3  . fluticasone furoate-vilanterol (BREO ELLIPTA) 200-25 MCG/INH AEPB Inhale 1 puff into the lungs daily.    . furosemide (LASIX) 40 MG tablet Take 1 tablet (40 mg total) by mouth 2 (two) times daily. 180 tablet 1  . gabapentin (NEURONTIN) 100 MG capsule TAKE 1 CAPSULE BY MOUTH THREE TIMES A DAY (Patient taking differently: TAKE 1 CAPSULE BY MOUTH TWO TIMES A DAY) 90 capsule 0  . glucose blood (BAYER CONTOUR TEST) test strip Use as instructed to check sugar 3 times daily 300 each 5  . insulin aspart (NOVOLOG FLEXPEN) 100 UNIT/ML FlexPen Inject 30-35 Units into the skin 3 (three) times daily with meals. 30 mL 5  . Insulin Glargine (TOUJEO SOLOSTAR) 300 UNIT/ML SOPN Inject 40 Units into the skin at bedtime. 5 pen 11  . Insulin Pen Needle 33G X 4 MM MISC 1 each by Does not apply route 4 (four) times daily. 200 each 11  . LORazepam (ATIVAN) 0.5 MG tablet Take 0.5 mg by mouth every 12 (twelve) hours as needed for anxiety.    . metFORMIN (GLUCOPHAGE) 1000 MG tablet Take 1 tablet (1,000 mg total) by mouth 2 (two) times daily with a meal. 180 tablet 0  . nitroGLYCERIN (NITROSTAT) 0.4 MG SL tablet Place 1 tablet (0.4 mg total) under the tongue every 5 (five) minutes as needed for chest pain. Reported on 01/05/2016 100 tablet 0  . ondansetron (ZOFRAN-ODT) 4 MG disintegrating tablet Take 4 mg by mouth every 8 (eight) hours as  needed for nausea or vomiting.     [name] PARoxetine Mesylate (BRISDELLE) 7.5 MG CAPS Take 7.5 mg by mouth daily.    . Potassium Chloride ER 20 MEQ TBCR Take 1 tablet by mouth 3 (three) times daily. (Patient taking differently: Take 1.5 tablets by mouth 2 (two) times daily. ) 90 tablet 3  . predniSONE (DELTASONE) 5 MG tablet Take 5 mg by mouth daily with breakfast.    . telmisartan (MICARDIS) 20 MG tablet Take 1 tablet (20 mg total)  by mouth daily. 30 tablet 1  . triamcinolone cream (KENALOG) 0.1 % Apply 1 application topically daily as needed (for irritation).     . venlafaxine XR (EFFEXOR-XR) 75 MG 24 hr capsule Take 2 capsules (150 mg total) by mouth at bedtime. (Patient taking differently: Take 75 mg by mouth 2 (two) times daily. )     No current facility-administered medications for this visit.     Objective: BP 122/66   Pulse 73   Temp 98.2 F (36.8 C) (Oral)   Ht _0  (1.676 m)   Wt 247 lb 9.6 oz (112.3 kg)   SpO2 96%   BMI 39.96 kg/m  Gen: NAD, resting comfortably No sinus tenderness  CV: RRR no murmurs rubs or gallops Lungs: CTAB no crackles, wheeze, rhonchi Abdomen: soft/nontender/nondistended/normal bowel sounds. obese Ext: no edema Skin: warm, dry  Assessment/Plan:  Sinusitis follow up S: patient had complete resolution of sinusitis symptoms with 10 day course of augmentin A/P: thankful for improvement pre surgery- she is medically maximized at this point between BP control and eradication of bacterial infection  Essential hypertension S: controlled on cartia XT 168m, coreg 236mBID, lasix 404mID with recent addition of telmisartan 60m46mome #s slightly high in 140s but with controlled diatolics BP Readings from Last 3 Encounters:  12/06/17 122/66  11/30/17 140/68  11/30/17 (!) 150/78  A/P: We discussed blood pressure goal of <140/90. Continue current meds:  Doing well with recent telmisartan addition.    Future Appointments  Date Time Provider [place]8/[place]  1:20 PM HardLeonie Man [facility]/2019 12:45 PM DudaNewt Minion [facility] None  01/27/2018  8:00 AM ElliRenato Shin [facility] None  02/20/2018 10:45 AM HuntMarin Olp [facility]   Return precautions advised.  StepGarret Reddish

## 2017-12-07 ENCOUNTER — Inpatient Hospital Stay (HOSPITAL_COMMUNITY)
Admission: RE | Admit: 2017-12-07 | Discharge: 2017-12-10 | DRG: 470 | Disposition: A | Discharge status: Home / Self Care | Payer: BLUE CROSS/BLUE SHIELD | Source: Ambulatory Visit | Attending: Orthopedic Surgery | Admitting: Orthopedic Surgery

## 2017-12-07 ENCOUNTER — Other Ambulatory Visit: Payer: Self-pay

## 2017-12-07 ENCOUNTER — Inpatient Hospital Stay (HOSPITAL_COMMUNITY): Payer: BLUE CROSS/BLUE SHIELD | Admitting: Certified Registered"

## 2017-12-07 ENCOUNTER — Inpatient Hospital Stay (HOSPITAL_COMMUNITY): Payer: BLUE CROSS/BLUE SHIELD | Admitting: Emergency Medicine

## 2017-12-07 ENCOUNTER — Encounter (HOSPITAL_COMMUNITY): Payer: Self-pay | Admitting: *Deleted

## 2017-12-07 ENCOUNTER — Encounter (HOSPITAL_COMMUNITY)
Admission: RE | Disposition: A | Discharge status: Home / Self Care | Payer: Self-pay | Source: Ambulatory Visit | Attending: Orthopedic Surgery

## 2017-12-07 DIAGNOSIS — Z794 Long term (current) use of insulin: Secondary | ICD-10-CM | POA: Diagnosis not present

## 2017-12-07 DIAGNOSIS — M879 Osteonecrosis, unspecified: Secondary | ICD-10-CM | POA: Diagnosis present

## 2017-12-07 DIAGNOSIS — Z96652 Presence of left artificial knee joint: Secondary | ICD-10-CM

## 2017-12-07 DIAGNOSIS — J449 Chronic obstructive pulmonary disease, unspecified: Secondary | ICD-10-CM | POA: Diagnosis present

## 2017-12-07 DIAGNOSIS — Z96651 Presence of right artificial knee joint: Secondary | ICD-10-CM | POA: Diagnosis not present

## 2017-12-07 DIAGNOSIS — Z79899 Other long term (current) drug therapy: Secondary | ICD-10-CM | POA: Diagnosis not present

## 2017-12-07 DIAGNOSIS — E119 Type 2 diabetes mellitus without complications: Secondary | ICD-10-CM | POA: Diagnosis not present

## 2017-12-07 DIAGNOSIS — K219 Gastro-esophageal reflux disease without esophagitis: Secondary | ICD-10-CM | POA: Diagnosis present

## 2017-12-07 DIAGNOSIS — Z90710 Acquired absence of both cervix and uterus: Secondary | ICD-10-CM

## 2017-12-07 DIAGNOSIS — N183 Chronic kidney disease, stage 3 (moderate): Secondary | ICD-10-CM | POA: Diagnosis present

## 2017-12-07 DIAGNOSIS — Z6839 Body mass index (BMI) 39.0-39.9, adult: Secondary | ICD-10-CM | POA: Diagnosis not present

## 2017-12-07 DIAGNOSIS — G4730 Sleep apnea, unspecified: Secondary | ICD-10-CM | POA: Diagnosis not present

## 2017-12-07 DIAGNOSIS — I5032 Chronic diastolic (congestive) heart failure: Secondary | ICD-10-CM | POA: Diagnosis not present

## 2017-12-07 DIAGNOSIS — M1712 Unilateral primary osteoarthritis, left knee: Secondary | ICD-10-CM | POA: Diagnosis not present

## 2017-12-07 DIAGNOSIS — G8918 Other acute postprocedural pain: Secondary | ICD-10-CM | POA: Diagnosis not present

## 2017-12-07 DIAGNOSIS — Z888 Allergy status to other drugs, medicaments and biological substances status: Secondary | ICD-10-CM | POA: Diagnosis not present

## 2017-12-07 DIAGNOSIS — E1122 Type 2 diabetes mellitus with diabetic chronic kidney disease: Secondary | ICD-10-CM | POA: Diagnosis not present

## 2017-12-07 DIAGNOSIS — Z8614 Personal history of Methicillin resistant Staphylococcus aureus infection: Secondary | ICD-10-CM

## 2017-12-07 DIAGNOSIS — I130 Hypertensive heart and chronic kidney disease with heart failure and stage 1 through stage 4 chronic kidney disease, or unspecified chronic kidney disease: Secondary | ICD-10-CM | POA: Diagnosis not present

## 2017-12-07 DIAGNOSIS — E785 Hyperlipidemia, unspecified: Secondary | ICD-10-CM | POA: Diagnosis not present

## 2017-12-07 DIAGNOSIS — Z881 Allergy status to other antibiotic agents status: Secondary | ICD-10-CM

## 2017-12-07 HISTORY — PX: TOTAL KNEE ARTHROPLASTY: SHX125

## 2017-12-07 HISTORY — DX: Unspecified osteoarthritis, unspecified site: M19.90

## 2017-12-07 LAB — GLUCOSE, CAPILLARY
Glucose-Capillary: 154 mg/dL — ABNORMAL HIGH (ref 65–99)
Glucose-Capillary: 207 mg/dL — ABNORMAL HIGH (ref 65–99)
Glucose-Capillary: 276 mg/dL — ABNORMAL HIGH (ref 65–99)

## 2017-12-07 SURGERY — ARTHROPLASTY, KNEE, TOTAL
Anesthesia: Spinal | Site: Knee | Laterality: Left

## 2017-12-07 MED ORDER — FENTANYL CITRATE (PF) 100 MCG/2ML IJ SOLN
INTRAMUSCULAR | Status: AC
  Filled 2017-12-07: qty 2

## 2017-12-07 MED ORDER — MIDAZOLAM HCL 2 MG/2ML IJ SOLN
INTRAMUSCULAR | Status: AC
  Filled 2017-12-07: qty 2

## 2017-12-07 MED ORDER — ALLOPURINOL 100 MG PO TABS
100.0000 mg | ORAL_TABLET | Freq: Two times a day (BID) | ORAL | Status: DC
  Administered 2017-12-07 – 2017-12-10 (×6): 100 mg via ORAL
  Filled 2017-12-07 (×6): qty 1

## 2017-12-07 MED ORDER — LACTATED RINGERS IV SOLN
INTRAVENOUS | Status: DC
  Administered 2017-12-07: 06:00:00 via INTRAVENOUS

## 2017-12-07 MED ORDER — ONDANSETRON HCL 4 MG/2ML IJ SOLN: 4.0000 mg | Freq: Four times a day (QID) | INTRAMUSCULAR | Status: DC | PRN

## 2017-12-07 MED ORDER — DOCUSATE SODIUM 100 MG PO CAPS
100.0000 mg | ORAL_CAPSULE | Freq: Two times a day (BID) | ORAL | Status: DC
  Administered 2017-12-07 – 2017-12-10 (×6): 100 mg via ORAL
  Filled 2017-12-07 (×6): qty 1

## 2017-12-07 MED ORDER — OXYCODONE HCL 5 MG PO TABS
ORAL_TABLET | ORAL | Status: AC
  Filled 2017-12-07: qty 1

## 2017-12-07 MED ORDER — SODIUM CHLORIDE 0.9 % IV SOLN
INTRAVENOUS | Status: DC
  Administered 2017-12-07: 17:00:00 via INTRAVENOUS

## 2017-12-07 MED ORDER — FUROSEMIDE 40 MG PO TABS
40.0000 mg | ORAL_TABLET | Freq: Two times a day (BID) | ORAL | Status: DC
  Administered 2017-12-07 – 2017-12-10 (×6): 40 mg via ORAL
  Filled 2017-12-07 (×6): qty 1

## 2017-12-07 MED ORDER — IRBESARTAN 150 MG PO TABS
75.0000 mg | ORAL_TABLET | Freq: Every day | ORAL | Status: DC
  Administered 2017-12-08 – 2017-12-10 (×3): 75 mg via ORAL
  Filled 2017-12-07 (×3): qty 1

## 2017-12-07 MED ORDER — ONDANSETRON HCL 4 MG/2ML IJ SOLN
INTRAMUSCULAR | Status: AC
  Filled 2017-12-07: qty 2

## 2017-12-07 MED ORDER — SODIUM CHLORIDE 0.9 % IV SOLN
2000.0000 mg | INTRAVENOUS | Status: AC
  Administered 2017-12-07: 2000 mg via TOPICAL
  Filled 2017-12-07: qty 20

## 2017-12-07 MED ORDER — CEFAZOLIN SODIUM-DEXTROSE 2-4 GM/100ML-% IV SOLN
2.0000 g | Freq: Four times a day (QID) | INTRAVENOUS | Status: AC
  Administered 2017-12-07 (×2): 2 g via INTRAVENOUS
  Filled 2017-12-07 (×2): qty 100

## 2017-12-07 MED ORDER — DEXTROSE 5 % IV SOLN
INTRAVENOUS | Status: DC | PRN
  Administered 2017-12-07: 20 ug/min via INTRAVENOUS

## 2017-12-07 MED ORDER — FLUTICASONE PROPIONATE 50 MCG/ACT NA SUSP
2.0000 | Freq: Every day | NASAL | Status: DC
  Administered 2017-12-07 – 2017-12-10 (×4): 2 via NASAL
  Filled 2017-12-07: qty 16

## 2017-12-07 MED ORDER — OXYCODONE HCL 5 MG/5ML PO SOLN: 5.0000 mg | Freq: Once | ORAL | Status: DC | PRN

## 2017-12-07 MED ORDER — ACETAMINOPHEN 325 MG PO TABS
650.0000 mg | ORAL_TABLET | ORAL | Status: DC | PRN
Start: 2017-12-07 — End: 2017-12-10

## 2017-12-07 MED ORDER — CEFAZOLIN SODIUM-DEXTROSE 2-4 GM/100ML-% IV SOLN
2.0000 g | INTRAVENOUS | Status: AC
  Administered 2017-12-07: 2 g via INTRAVENOUS
  Filled 2017-12-07: qty 100

## 2017-12-07 MED ORDER — PAROXETINE MESYLATE 7.5 MG PO CAPS: 7.5000 mg | ORAL_CAPSULE | Freq: Every day | ORAL | Status: DC

## 2017-12-07 MED ORDER — FENTANYL CITRATE (PF) 250 MCG/5ML IJ SOLN
INTRAMUSCULAR | Status: AC
  Filled 2017-12-07: qty 5

## 2017-12-07 MED ORDER — TRANEXAMIC ACID 1000 MG/10ML IV SOLN
1000.0000 mg | INTRAVENOUS | Status: AC
  Administered 2017-12-07: 1000 mg via INTRAVENOUS
  Filled 2017-12-07: qty 10

## 2017-12-07 MED ORDER — MAGNESIUM CITRATE PO SOLN: 1.0000 | Freq: Once | ORAL | Status: DC | PRN

## 2017-12-07 MED ORDER — INSULIN ASPART 100 UNIT/ML ~~LOC~~ SOLN
4.0000 [IU] | Freq: Three times a day (TID) | SUBCUTANEOUS | Status: DC
  Administered 2017-12-07 – 2017-12-09 (×6): 4 [IU] via SUBCUTANEOUS

## 2017-12-07 MED ORDER — MIDAZOLAM HCL 5 MG/5ML IJ SOLN
INTRAMUSCULAR | Status: DC | PRN
  Administered 2017-12-07: 2 mg via INTRAVENOUS

## 2017-12-07 MED ORDER — SODIUM CHLORIDE 0.9 % IR SOLN
Status: DC | PRN
  Administered 2017-12-07: 3000 mL

## 2017-12-07 MED ORDER — ACETAMINOPHEN 650 MG RE SUPP: 650.0000 mg | RECTAL | Status: DC | PRN

## 2017-12-07 MED ORDER — FENTANYL CITRATE (PF) 100 MCG/2ML IJ SOLN
25.0000 ug | INTRAMUSCULAR | Status: DC | PRN
  Administered 2017-12-07 (×3): 50 ug via INTRAVENOUS

## 2017-12-07 MED ORDER — BUPROPION HCL ER (XL) 150 MG PO TB24
300.0000 mg | ORAL_TABLET | Freq: Every day | ORAL | Status: DC
Start: 2017-12-08 — End: 2017-12-10
  Administered 2017-12-08 – 2017-12-10 (×3): 300 mg via ORAL
  Filled 2017-12-07 (×3): qty 2

## 2017-12-07 MED ORDER — FENOFIBRATE 54 MG PO TABS
54.0000 mg | ORAL_TABLET | Freq: Every day | ORAL | Status: DC
  Administered 2017-12-08 – 2017-12-10 (×3): 54 mg via ORAL
  Filled 2017-12-07 (×3): qty 1

## 2017-12-07 MED ORDER — OXYCODONE HCL 5 MG PO TABS
5.0000 mg | ORAL_TABLET | ORAL | Status: DC | PRN
  Administered 2017-12-07: 5 mg via ORAL
  Filled 2017-12-07: qty 1

## 2017-12-07 MED ORDER — METFORMIN HCL 500 MG PO TABS
1000.0000 mg | ORAL_TABLET | Freq: Two times a day (BID) | ORAL | Status: DC
  Administered 2017-12-07 – 2017-12-10 (×6): 1000 mg via ORAL
  Filled 2017-12-07 (×6): qty 2

## 2017-12-07 MED ORDER — 0.9 % SODIUM CHLORIDE (POUR BTL) OPTIME
TOPICAL | Status: DC | PRN
  Administered 2017-12-07: 1000 mL

## 2017-12-07 MED ORDER — VENLAFAXINE HCL ER 150 MG PO CP24
150.0000 mg | ORAL_CAPSULE | Freq: Every day | ORAL | Status: DC
  Administered 2017-12-08 – 2017-12-10 (×3): 150 mg via ORAL
  Filled 2017-12-07 (×3): qty 1

## 2017-12-07 MED ORDER — POLYETHYLENE GLYCOL 3350 17 G PO PACK: 17.0000 g | PACK | Freq: Every day | ORAL | Status: DC | PRN

## 2017-12-07 MED ORDER — INSULIN GLARGINE 100 UNIT/ML ~~LOC~~ SOLN
30.0000 [IU] | Freq: Every day | SUBCUTANEOUS | Status: DC
  Administered 2017-12-07 – 2017-12-09 (×3): 30 [IU] via SUBCUTANEOUS
  Filled 2017-12-07 (×3): qty 0.3

## 2017-12-07 MED ORDER — ALBUTEROL SULFATE (2.5 MG/3ML) 0.083% IN NEBU: 2.5000 mg | INHALATION_SOLUTION | Freq: Four times a day (QID) | RESPIRATORY_TRACT | Status: DC | PRN

## 2017-12-07 MED ORDER — ONDANSETRON HCL 4 MG PO TABS
4.0000 mg | ORAL_TABLET | Freq: Four times a day (QID) | ORAL | Status: DC | PRN
  Administered 2017-12-10: 4 mg via ORAL
  Filled 2017-12-07: qty 1

## 2017-12-07 MED ORDER — PHENOL 1.4 % MT LIQD: 1.0000 | OROMUCOSAL | Status: DC | PRN

## 2017-12-07 MED ORDER — PROPOFOL 10 MG/ML IV BOLUS
INTRAVENOUS | Status: AC
  Filled 2017-12-07: qty 20

## 2017-12-07 MED ORDER — MENTHOL 3 MG MT LOZG: 1.0000 | LOZENGE | OROMUCOSAL | Status: DC | PRN

## 2017-12-07 MED ORDER — OXYCODONE HCL 5 MG PO TABS: 5.0000 mg | ORAL_TABLET | Freq: Once | ORAL | Status: DC | PRN

## 2017-12-07 MED ORDER — ATORVASTATIN CALCIUM 40 MG PO TABS
40.0000 mg | ORAL_TABLET | Freq: Every day | ORAL | Status: DC
  Administered 2017-12-07 – 2017-12-09 (×3): 40 mg via ORAL
  Filled 2017-12-07 (×3): qty 1

## 2017-12-07 MED ORDER — METOCLOPRAMIDE HCL 5 MG/ML IJ SOLN: 5.0000 mg | Freq: Three times a day (TID) | INTRAMUSCULAR | Status: DC | PRN

## 2017-12-07 MED ORDER — LORAZEPAM 0.5 MG PO TABS
0.5000 mg | ORAL_TABLET | Freq: Two times a day (BID) | ORAL | Status: DC | PRN
  Administered 2017-12-08: 0.5 mg via ORAL
  Filled 2017-12-07: qty 1

## 2017-12-07 MED ORDER — FLUTICASONE FUROATE-VILANTEROL 200-25 MCG/INH IN AEPB
1.0000 | INHALATION_SPRAY | Freq: Every day | RESPIRATORY_TRACT | Status: DC
  Administered 2017-12-08 – 2017-12-10 (×3): 1 via RESPIRATORY_TRACT
  Filled 2017-12-07: qty 28

## 2017-12-07 MED ORDER — CARVEDILOL 25 MG PO TABS
25.0000 mg | ORAL_TABLET | Freq: Two times a day (BID) | ORAL | Status: DC
  Administered 2017-12-07 – 2017-12-10 (×6): 25 mg via ORAL
  Filled 2017-12-07 (×6): qty 1

## 2017-12-07 MED ORDER — POTASSIUM CHLORIDE CRYS ER 20 MEQ PO TBCR
20.0000 meq | EXTENDED_RELEASE_TABLET | Freq: Three times a day (TID) | ORAL | Status: DC
Start: 2017-12-07 — End: 2017-12-10
  Administered 2017-12-07 – 2017-12-10 (×8): 20 meq via ORAL
  Filled 2017-12-07 (×9): qty 1

## 2017-12-07 MED ORDER — INSULIN ASPART 100 UNIT/ML FLEXPEN: 30.0000 [IU] | PEN_INJECTOR | Freq: Three times a day (TID) | SUBCUTANEOUS | Status: DC

## 2017-12-07 MED ORDER — HYDROMORPHONE HCL 1 MG/ML IJ SOLN
INTRAMUSCULAR | Status: AC
  Filled 2017-12-07: qty 1

## 2017-12-07 MED ORDER — POVIDONE-IODINE 10 % EX SWAB
2.0000 "application " | Freq: Once | CUTANEOUS | Status: AC
  Administered 2017-12-07: 2 via TOPICAL

## 2017-12-07 MED ORDER — PANTOPRAZOLE SODIUM 40 MG PO TBEC
40.0000 mg | DELAYED_RELEASE_TABLET | Freq: Every day | ORAL | Status: DC
  Administered 2017-12-08 – 2017-12-10 (×3): 40 mg via ORAL
  Filled 2017-12-07 (×3): qty 1

## 2017-12-07 MED ORDER — VANCOMYCIN HCL IN DEXTROSE 1-5 GM/200ML-% IV SOLN
1000.0000 mg | Freq: Once | INTRAVENOUS | Status: AC
  Administered 2017-12-07: 1000 mg via INTRAVENOUS
  Filled 2017-12-07: qty 200

## 2017-12-07 MED ORDER — ONDANSETRON HCL 4 MG/2ML IJ SOLN
INTRAMUSCULAR | Status: DC | PRN
  Administered 2017-12-07: 4 mg via INTRAVENOUS

## 2017-12-07 MED ORDER — BUPIVACAINE IN DEXTROSE 0.75-8.25 % IT SOLN
INTRATHECAL | Status: DC | PRN
  Administered 2017-12-07: 2 mL via INTRATHECAL

## 2017-12-07 MED ORDER — ASPIRIN EC 325 MG PO TBEC
325.0000 mg | DELAYED_RELEASE_TABLET | Freq: Every day | ORAL | Status: DC
  Administered 2017-12-08 – 2017-12-10 (×3): 325 mg via ORAL
  Filled 2017-12-07 (×3): qty 1

## 2017-12-07 MED ORDER — OXYCODONE HCL 5 MG PO TABS
10.0000 mg | ORAL_TABLET | ORAL | Status: DC | PRN
  Administered 2017-12-07 – 2017-12-09 (×8): 10 mg via ORAL
  Filled 2017-12-07 (×8): qty 2

## 2017-12-07 MED ORDER — INSULIN ASPART 100 UNIT/ML ~~LOC~~ SOLN
0.0000 [IU] | Freq: Three times a day (TID) | SUBCUTANEOUS | Status: DC
  Administered 2017-12-07: 5 [IU] via SUBCUTANEOUS
  Administered 2017-12-08 (×3): 3 [IU] via SUBCUTANEOUS
  Administered 2017-12-09 (×2): 5 [IU] via SUBCUTANEOUS

## 2017-12-07 MED ORDER — PROPOFOL 10 MG/ML IV BOLUS
INTRAVENOUS | Status: DC | PRN
  Administered 2017-12-07: 30 mg via INTRAVENOUS

## 2017-12-07 MED ORDER — PREDNISONE 5 MG PO TABS
5.0000 mg | ORAL_TABLET | Freq: Every day | ORAL | Status: DC
  Administered 2017-12-07 – 2017-12-10 (×4): 5 mg via ORAL
  Filled 2017-12-07 (×4): qty 1

## 2017-12-07 MED ORDER — GABAPENTIN 100 MG PO CAPS
100.0000 mg | ORAL_CAPSULE | Freq: Three times a day (TID) | ORAL | Status: DC
  Administered 2017-12-07 – 2017-12-10 (×9): 100 mg via ORAL
  Filled 2017-12-07 (×9): qty 1

## 2017-12-07 MED ORDER — BISACODYL 10 MG RE SUPP: 10.0000 mg | Freq: Every day | RECTAL | Status: DC | PRN

## 2017-12-07 MED ORDER — PROMETHAZINE HCL 25 MG/ML IJ SOLN: 6.2500 mg | INTRAMUSCULAR | Status: DC | PRN

## 2017-12-07 MED ORDER — METOCLOPRAMIDE HCL 5 MG PO TABS: 5.0000 mg | ORAL_TABLET | Freq: Three times a day (TID) | ORAL | Status: DC | PRN

## 2017-12-07 MED ORDER — PROPOFOL 500 MG/50ML IV EMUL
INTRAVENOUS | Status: DC | PRN
  Administered 2017-12-07: 75 ug/kg/min via INTRAVENOUS

## 2017-12-07 MED ORDER — DILTIAZEM HCL ER COATED BEADS 120 MG PO CP24
120.0000 mg | ORAL_CAPSULE | Freq: Every day | ORAL | Status: DC
  Administered 2017-12-08 – 2017-12-10 (×3): 120 mg via ORAL
  Filled 2017-12-07 (×3): qty 1

## 2017-12-07 MED ORDER — BUPIVACAINE-EPINEPHRINE (PF) 0.5% -1:200000 IJ SOLN
INTRAMUSCULAR | Status: DC | PRN
  Administered 2017-12-07: 30 mL via PERINEURAL

## 2017-12-07 MED ORDER — HYDROMORPHONE HCL 1 MG/ML IJ SOLN
1.0000 mg | INTRAMUSCULAR | Status: DC | PRN
  Administered 2017-12-07 – 2017-12-08 (×5): 1 mg via INTRAVENOUS
  Filled 2017-12-07 (×4): qty 1

## 2017-12-07 SURGICAL SUPPLY — 43 items
BLADE SAGITTAL 25.0X1.19X90 (BLADE) ×2 IMPLANT
BLADE SAW SGTL 13X75X1.27 (BLADE) ×2 IMPLANT
BLADE SURG 21 STRL SS (BLADE) ×4 IMPLANT
BNDG COHESIVE 6X5 TAN STRL LF (GAUZE/BANDAGES/DRESSINGS) ×2 IMPLANT
BNDG GAUZE ELAST 4 BULKY (GAUZE/BANDAGES/DRESSINGS) ×2 IMPLANT
BONE CEMENT PALACOSE (Cement) ×2 IMPLANT
BOWL SMART MIX CTS (DISPOSABLE) ×2 IMPLANT
CAP KNEE TOTAL 3 SIGMA ×2 IMPLANT
CEMENT BONE PALACOSE (Cement) ×1 IMPLANT
COVER SURGICAL LIGHT HANDLE (MISCELLANEOUS) ×2 IMPLANT
CUFF TOURNIQUET SINGLE 34IN LL (TOURNIQUET CUFF) ×2 IMPLANT
DRAPE EXTREMITY T 121X128X90 (DRAPE) ×2 IMPLANT
DRAPE HALF SHEET 40X57 (DRAPES) ×4 IMPLANT
DRAPE U-SHAPE 47X51 STRL (DRAPES) ×2 IMPLANT
DRSG ADAPTIC 3X8 NADH LF (GAUZE/BANDAGES/DRESSINGS) ×2 IMPLANT
DRSG PAD ABDOMINAL 8X10 ST (GAUZE/BANDAGES/DRESSINGS) ×2 IMPLANT
DURAPREP 26ML APPLICATOR (WOUND CARE) ×2 IMPLANT
ELECT REM PT RETURN 9FT ADLT (ELECTROSURGICAL) ×2
ELECTRODE REM PT RTRN 9FT ADLT (ELECTROSURGICAL) ×1 IMPLANT
GAUZE SPONGE 4X4 12PLY STRL (GAUZE/BANDAGES/DRESSINGS) ×2 IMPLANT
GLOVE BIOGEL PI IND STRL 9 (GLOVE) ×1 IMPLANT
GLOVE BIOGEL PI INDICATOR 9 (GLOVE) ×1
GLOVE SURG ORTHO 9.0 STRL STRW (GLOVE) ×4 IMPLANT
GOWN STRL REUS W/ TWL XL LVL3 (GOWN DISPOSABLE) ×2 IMPLANT
GOWN STRL REUS W/TWL XL LVL3 (GOWN DISPOSABLE) ×4
HANDPIECE INTERPULSE COAX TIP (DISPOSABLE) ×2
KIT BASIN OR (CUSTOM PROCEDURE TRAY) ×2 IMPLANT
KIT ROOM TURNOVER OR (KITS) ×2 IMPLANT
MANIFOLD NEPTUNE II (INSTRUMENTS) ×2 IMPLANT
NS IRRIG 1000ML POUR BTL (IV SOLUTION) ×2 IMPLANT
PACK TOTAL JOINT (CUSTOM PROCEDURE TRAY) ×2 IMPLANT
PAD ARMBOARD 7.5X6 YLW CONV (MISCELLANEOUS) ×2 IMPLANT
PIN STEINMAN FIXATION KNEE (PIN) ×2 IMPLANT
SET HNDPC FAN SPRY TIP SCT (DISPOSABLE) ×1 IMPLANT
STAPLER VISISTAT 35W (STAPLE) ×2 IMPLANT
SUT VIC AB 0 CT1 27 (SUTURE) ×1
SUT VIC AB 0 CT1 27XBRD ANBCTR (SUTURE) ×1 IMPLANT
SUT VIC AB 1 CTX 27 (SUTURE) ×2 IMPLANT
SUT VIC AB 1 CTX 36 (SUTURE) ×2
SUT VIC AB 1 CTX36XBRD ANBCTR (SUTURE) ×1 IMPLANT
TOWEL OR 17X24 6PK STRL BLUE (TOWEL DISPOSABLE) ×2 IMPLANT
TOWEL OR 17X26 10 PK STRL BLUE (TOWEL DISPOSABLE) ×2 IMPLANT
WRAP KNEE MAXI GEL POST OP (GAUZE/BANDAGES/DRESSINGS) ×2 IMPLANT

## 2017-12-07 NOTE — Anesthesia Postprocedure Evaluation (Signed)
Anesthesia Post Note  Patient: KAYANN MAJ  Procedure(s) Performed: LEFT TOTAL KNEE ARTHROPLASTY (Left Knee)     Patient location during evaluation: PACU Anesthesia Type: Spinal Level of consciousness: awake and alert Pain management: pain level controlled Vital Signs Assessment: post-procedure vital signs reviewed and stable Respiratory status: spontaneous breathing and respiratory function stable Cardiovascular status: blood pressure returned to baseline and stable Postop Assessment: spinal receding and no apparent nausea or vomiting Anesthetic complications: no    Last Vitals:  Vitals:   12/07/17 1015 12/07/17 1022  BP:  (!) 109/57  Pulse: 70 71  Resp: 17 16  Temp:    SpO2: 96% 98%    Last Pain:  Vitals:   12/07/17 0600  TempSrc: Oral                 Audry Pili

## 2017-12-07 NOTE — Evaluation (Signed)
Physical Therapy Evaluation Patient Details Name: [name] MRN: [id number] DOB: [birth year] Today's Date: 12/07/2017   History of Present Illness  [name] a 57 y/o female s/p elective L TKA. PMH includes HTN, gout, pulmonary sarcoidosis, CHF, DM, COPD, and R TKA.   Clinical Impression  [name] s/p surgery above with deficits below. Mobility limited to chair this session secondary to increased pain. Required min to mod A for mobility with RW.  Educated about HEP and knee precautions. Anticipate pt will progress well once pain controlled. Will continue to follow acutely to maximize functional mobility independence and safety.     Follow Up Recommendations Follow surgeon's recommendation for DC plan and follow-up therapies;Supervision for mobility/OOB    Equipment Recommendations  None recommended by PT    Recommendations for Other Services       Precautions / Restrictions Precautions Precautions: Knee Precaution Booklet Issued: Yes (comment) Precaution Comments: Reviewed knee precautions. Limited tolerance for ther ex secondary to pain.  Restrictions Weight Bearing Restrictions: Yes LLE Weight Bearing: Weight bearing as tolerated      Mobility  Bed Mobility Overal bed mobility: Needs Assistance Bed Mobility: Supine to Sit     Supine to sit: Min assist     General bed mobility comments: Min A for LLE management. Use of bed rails and elevated HOB. Increased time required secondary to pain.   Transfers Overall transfer level: Needs assistance Equipment used: Rolling walker (2 wheeled) Transfers: Sit to/from [facility] Sit to Stand: Mod assist Stand pivot transfers: Min assist       General transfer comment: Required 2 attempts to stand. Verbal cues for hand placement, however, pt reaching for chair and middle of walker to stand despite cues. Mod A for lift assist and steadying assist. Min A for steadying throughout stand pivot to chair. Cues for  sequencing with RW. Further distance limited secondary to increased pain.   Ambulation/Gait             General Gait Details: NT  Stairs            Wheelchair Mobility    Modified Rankin (Stroke Patients Only)       Balance Overall balance assessment: Needs assistance Sitting-balance support: No upper extremity supported;Feet supported Sitting balance-Leahy Scale: Good     Standing balance support: Bilateral upper extremity supported;During functional activity Standing balance-Leahy Scale: Poor Standing balance comment: Reliant on BUE support and external support.                              Pertinent Vitals/Pain Pain Assessment: 0-10 Pain Score: 4  Pain Location: R knee  Pain Descriptors / Indicators: Aching;Operative site guarding Pain Intervention(s): Limited activity within patient's tolerance;Monitored during session;Repositioned    Home Living Family/patient expects to be discharged to:: Private residence Living Arrangements: Spouse/significant other Available Help at Discharge: Family;Available 24 hours/day Type of Home: House Home Access: Stairs to enter Entrance Stairs-Rails: Right Entrance Stairs-Number of Steps: 2 Home Layout: One level Home Equipment: Walker - 2 wheels;Bedside commode      Prior Function Level of Independence: Independent               Hand Dominance   Dominant Hand: Right    Extremity/Trunk Assessment   Upper Extremity Assessment Upper Extremity Assessment: Defer to OT evaluation    Lower Extremity Assessment Lower Extremity Assessment: LLE deficits/detail LLE Deficits / Details: Sensory in tact. Deficits consistent  with post op pain and weakness. Limited exercise tolerance secondary to pain.     Cervical / Trunk Assessment Cervical / Trunk Assessment: Normal  Communication   Communication: No difficulties  Cognition Arousal/Alertness: Awake/alert Behavior During Therapy: WFL for tasks  assessed/performed Overall Cognitive Status: Within Functional Limits for tasks assessed                                        General Comments      Exercises Total Joint Exercises Ankle Circles/Pumps: AROM;Both;20 reps Quad Sets: (verbal education )   Assessment/Plan    PT Assessment Patient needs continued PT services  PT Problem List Decreased strength;Decreased activity tolerance;Decreased balance;Decreased range of motion;Decreased mobility;Decreased knowledge of use of DME;Decreased knowledge of precautions;Pain       PT Treatment Interventions DME instruction;Gait training;Stair training;Functional mobility training;Therapeutic activities;Therapeutic exercise;Balance training;Neuromuscular re-education;Patient/family education    PT Goals (Current goals can be found in the Care Plan section)  Acute Rehab PT Goals Patient Stated Goal: to get better  PT Goal Formulation: With patient Time For Goal Achievement: 12/21/17 Potential to Achieve Goals: Good    Frequency 7X/week   Barriers to discharge        Co-evaluation               AM-PAC PT "6 Clicks" Daily Activity  Outcome Measure Difficulty turning over in bed (including adjusting bedclothes, sheets and blankets)?: A Little Difficulty moving from lying on back to sitting on the side of the bed? : Unable Difficulty sitting down on and standing up from a chair with arms (e.g., wheelchair, bedside commode, etc,.)?: Unable Help needed moving to and from a bed to chair (including a wheelchair)?: A Little Help needed walking in hospital room?: A Lot Help needed climbing 3-5 steps with a railing? : A Lot 6 Click Score: 12    End of Session Equipment Utilized During Treatment: Gait belt Activity Tolerance: Patient limited by pain Patient left: in chair;with call bell/phone within reach Nurse Communication: Mobility status PT Visit Diagnosis: Unsteadiness on feet (R26.81);Other abnormalities of  gait and mobility (R26.89);Pain Pain - Right/Left: Left Pain - part of body: Knee    Time: 1647-1710 PT Time Calculation (min) (ACUTE ONLY): 23 min   Charges:   PT Evaluation $PT Eval Low Complexity: 1 Low PT Treatments $Therapeutic Activity: 8-22 mins   PT G Codes:        Leighton Ruff, PT, DPT  Acute Rehabilitation Services  Pager: [phone]   Rudean Hitt 12/07/2017, 6:55 PM

## 2017-12-07 NOTE — Transfer of Care (Signed)
Immediate Anesthesia Transfer of Care Note  Patient: [name]  Procedure(s) Performed: LEFT TOTAL KNEE ARTHROPLASTY (Left Knee)  Patient Location: PACU  Anesthesia Type:Spinal  Level of Consciousness: drowsy and patient cooperative  Airway & Oxygen Therapy: Patient Spontanous Breathing and Patient connected to face mask oxygen  Post-op Assessment: Report given to RN and Post -op Vital signs reviewed and stable  Post vital signs: Reviewed and stable  Last Vitals:  Vitals:   12/07/17 0600 12/07/17 0922  BP: (!) 115/40 (!) 105/56  Pulse: 73 73  Resp: 20 11  Temp: 36.7 C (!) 36.2 C  SpO2: 99% 100%    Last Pain:  Vitals:   12/07/17 0600  TempSrc: Oral         Complications: No apparent anesthesia complications

## 2017-12-07 NOTE — Progress Notes (Signed)
PT Cancellation Note  Patient Details Name: DEOSHA WERDEN MRN: [id number] DOB: 05/13/1961   Cancelled Treatment:    Reason Eval/Treat Not Completed: Pain limiting ability to participate Pt reporting increased pain and requesting for PT to come back at a later time. Will follow up as schedule allows.   Leighton Ruff, PT, DPT  Acute Rehabilitation Services  Pager: [phone]  Rudean Hitt 12/07/2017, 3:55 PM

## 2017-12-07 NOTE — Discharge Instructions (Signed)

## 2017-12-07 NOTE — Anesthesia Procedure Notes (Signed)
Anesthesia Regional Block: Adductor canal block   Pre-Anesthetic Checklist: ,, timeout performed, Correct Patient, Correct Site, Correct Laterality, Correct Procedure, Correct Position, site marked, Risks and benefits discussed,  Surgical consent,  Pre-op evaluation,  At surgeon's request and post-op pain management  Laterality: Left  Prep: chloraprep       Needles:  Injection technique: Single-shot  Needle Type: Echogenic Needle     Needle Length: 9cm  Needle Gauge: 21     Additional Needles:   Narrative:  Start time: 12/07/2017 7:04 AM End time: 12/07/2017 7:08 AM Injection made incrementally with aspirations every 5 mL.  Performed by: Personally  Anesthesiologist: Audry Pili, MD  Additional Notes: No pain on injection. No increased resistance to injection. Injection made in 5cc increments. Good needle visualization. Patient tolerated the procedure well.

## 2017-12-07 NOTE — Op Note (Signed)
DATE OF SURGERY:  12/07/2017  TIME: 9:09 AM  PATIENT NAME:  [name]    AGE: 57 y.o.    PRE-OPERATIVE DIAGNOSIS:  Osteoarthritis Left Knee  POST-OPERATIVE DIAGNOSIS:  Osteoarthritis Left Knee  PROCEDURE:  Procedure(s): LEFT TOTAL KNEE ARTHROPLASTY  SURGEON: Meridee Score  ASSISTANT: April Green  OPERATIVE IMPLANTS: Depuy , Posterior Stabilized.  Femur size 6, Tibia size 6, Patella size 35 3-peg oval button, with a 5 mm polyethylene insert.  @ENCIMAGES @    PREOPERATIVE INDICATIONS:   [name] is a 57 y.o. year old female with end stage degenerative arthritis of the knee who failed conservative treatment and elected for Total Knee Arthroplasty.   The risks, benefits, and alternatives were discussed at length including but not limited to the risks of infection, bleeding, nerve injury, stiffness, blood clots, the need for revision surgery, cardiopulmonary complications, among others, and they were willing to proceed.  OPERATIVE DESCRIPTION:  The patient was brought to the operative room and placed in a supine position.  General anesthesia was administered.  IV antibiotics were given.  The lower extremity was prepped and draped in the usual sterile fashion.  Charlie Pitter was used to cover all exposed skin. Time out was performed.    Anterior quadriceps tendon splitting approach was performed.  The patella was everted and osteophytes were removed.  The anterior horn of the medial and lateral meniscus was removed.   The distal femur was opened with the drill and the intramedullary distal femoral cutting jig was utilized, set at 5 degrees valgus resecting 9 mm off the distal femur.  Care was taken to protect the collateral ligaments.  Then the extramedullary tibial cutting jig was utilized set for 3 degree posterior slope.  Care was taken during the cut to protect the medial and collateral ligaments.  The proximal tibia was removed along with the posterior horns of the menisci.   The PCL was sacrificed.    The extensor gap was measured and was approximately 5 mm.    The distal femoral sizing jig was applied, taking care to avoid notching.  Then the 4-in-1 cutting jig was applied and the anterior and posterior femur was cut, along with the chamfer cuts.  All posterior osteophytes were removed.  The flexion gap was then measured and was symmetric with the extension gap.  The distal femoral preparation using the appropriate jig to prepare the box.  The patella was then measured, and cut with the saw.    The proximal tibia sized and prepared accordingly with the reamer and the punch, and then all components were trialed with the poly insert.  The knee was found to have stable balance and full motion.  The knee was irrigated with normal saline and the knee was soaked with TXA.  The above named components were then cemented into place and all excess cement was removed.  The final polyethylene component was in place during cementation.  The knee was kept in extension until the cement hardened.  The knee was then taken through a range of motion and the patella tracked well and the knee irrigated copiously and the parapatellar and subcutaneous tissue closed with vicryl, and skin closed with staples..  A sterile dressing was applied and patient  was taken to the PACU in stable  condition.  There were no complications.  Total tourniquet time was 32 minutes.

## 2017-12-07 NOTE — Anesthesia Procedure Notes (Signed)
Spinal  Patient location during procedure: OR Start time: 12/07/2017 7:46 AM End time: 12/07/2017 7:50 AM Staffing Anesthesiologist: Audry Pili, MD Performed: anesthesiologist  Preanesthetic Checklist Completed: patient identified, surgical consent, pre-op evaluation, timeout performed, IV checked, risks and benefits discussed and monitors and equipment checked Spinal Block Patient position: sitting Prep: DuraPrep Patient monitoring: heart rate, cardiac monitor, continuous pulse ox and blood pressure Approach: midline Location: L3-4 Injection technique: single-shot Needle Needle type: Pencan  Needle gauge: 24 G Additional Notes Functioning IV was confirmed and monitors were applied. Sterile prep and drape, including hand hygiene, mask, and sterile gloves were used. The patient was positioned and the spine was prepped. The skin was anesthetized with lidocaine. Free flow of clear CSF was obtained prior to injecting local anesthetic into the CSF. The spinal needle aspirated freely following injection. The needle was carefully withdrawn. The patient tolerated the procedure well. Consent was obtained prior to the procedure with all questions answered and concerns addressed. Risks including, but not limited to, bleeding, infection, nerve damage, paralysis, failed block, inadequate analgesia, allergic reaction, high spinal, itching, and headache were discussed and the patient wished to proceed.  Renold Don, MD

## 2017-12-07 NOTE — H&P (Signed)
TOTAL KNEE ADMISSION H&P  Patient is being admitted for left total knee arthroplasty.  Subjective:  Chief Complaint:left knee pain.  HPI: [name], 57 y.o. female, has a history of pain and functional disability in the left knee due to arthritis and has failed non-surgical conservative treatments for greater than 12 weeks to includeNSAID's and/or analgesics, corticosteriod injections and activity modification.  Onset of symptoms was gradual, starting 8 years ago with gradually worsening course since that time. The patient noted no past surgery on the left knee(s).  Patient currently rates pain in the left knee(s) at 8 out of 10 with activity. Patient has night pain, worsening of pain with activity and weight bearing, pain that interferes with activities of daily living, pain with passive range of motion, crepitus and joint swelling.  Patient has evidence of subchondral cysts, subchondral sclerosis, periarticular osteophytes, joint subluxation and joint space narrowing by imaging studies. This patient has had avascular necrosis of the knee. There is no active infection.  Patient Active Problem List   Diagnosis Date Noted  . Unilateral primary osteoarthritis, left knee 11/07/2017  . Dysphagia 09/20/2017  . Epiglottitis   . Plantar fasciitis, right 07/13/2017  . S/P total knee arthroplasty, right 06/29/2017  . Osteoarthrosis, localized, primary, knee, right   . Sprain of calcaneofibular ligament of right ankle 06/06/2017  . Osteoarthritis of right knee 01/26/2017  . Multiple allergies 10/14/2016  . Onychomycosis 10/27/2015  . Osteomyelitis of left foot ([place]) 05/29/2015  . CKD (chronic kidney disease) stage 3, GFR 30-59 ml/min (HCC) 04/27/2015  . MRSA (methicillin resistant staph aureus) culture positive 03/27/2015  . Wound infection complicating hardware ([place]) 03/27/2015  . Fatty liver 09/30/2014  . Hot flashes 07/15/2014  . Abnormal SPEP 04/17/2014  . Fracture of left leg  04/17/2014  . Cushingoid side effect of steroids ([place]) 04/17/2014  . Internal hemorrhoids   . Depression   . Preoperative clearance 03/25/2014  . Obstructive chronic bronchitis without exacerbation ([place]) 09/18/2013  . Chest pain 04/11/2013  . Hypertensive heart disease with chronic diastolic congestive heart failure ([place])   . Solitary pulmonary nodule, on CT 02/2013 - stable over 2 years in 2015 02/20/2013  . Gout 08/20/2010  . Anemia 09/18/2009  . Morbid obesity ([place]) 06/04/2009  . Sleep apnea 04/21/2009  . Sarcoidosis of lung ([place]) 04/10/2007  . Hyperlipidemia 08/21/2006  . Essential hypertension 08/21/2006  . GERD 08/21/2006  . Type II diabetes mellitus with renal manifestations ([place]) 08/21/2006   Past Medical History:  Diagnosis Date  . Abnormal SPEP 04/17/2014  . Acute left ankle pain 01/26/2017  . ANEMIA-UNSPECIFIED 09/18/2009  . Anxiety   . CHF (congestive heart failure) ([place])   . Chronic diastolic heart failure, NYHA class 2 (HCC)    LVEDP roughly 20% by cath  . COPD (chronic obstructive pulmonary disease) ([place])   . Depression   . DIABETES MELLITUS, TYPE II 08/21/2006  . Diabetic osteomyelitis ([place]) 05/29/2015  . Fracture of 5th metatarsal    non union  . GERD 08/21/2006  . GOUT 08/20/2010  . Hx of umbilical hernia repair   . HYPERLIPIDEMIA 08/21/2006  . HYPERTENSION 08/21/2006  . Infection of wound due to methicillin resistant Staphylococcus aureus (MRSA)   . Internal hemorrhoids   . Multiple allergies 10/14/2016  . OBESITY 06/04/2009  . Onychomycosis 10/27/2015  . Osteomyelitis of left foot ([place]) 05/29/2015  . Pulmonary sarcoidosis ([place])    Followed locally by pulmonology, but also by Dr. Casper Harrison at [facility]  .  Right knee pain 01/26/2017  . Vocal cord dysfunction   . Wears partial dentures     Past Surgical History:  Procedure Laterality Date  . ABDOMINAL HYSTERECTOMY    . APPENDECTOMY    . BLADDER SUSPENSION  11/11/2011   Procedure: TRANSVAGINAL  TAPE (TVT) PROCEDURE;  Surgeon: Olga Millers, MD;  Location: [facility];  Service: Gynecology;  Laterality: N/A;  . CARDIAC CATHETERIZATION  07/2010   LVEF 50-55% WITH VERY MILD GLOBAL HYPOKINESIA; ESSENTIALLY NORMAL CORONARY ARTERIES; NORMAL LV FUNCTION  . CAROTIDS  02/18/11   CAROTID DUPLEX; VERTEBRALS ARE PATENT WITH ANTEGRADE FLOW. ICA/CCA RATIO 1.61 ON RIGHT AND 0.75 ON LEFT  . CHOLECYSTECTOMY  1984  . CYSTOSCOPY  11/11/2011   Procedure: CYSTOSCOPY;  Surgeon: Olga Millers, MD;  Location: [facility];  Service: Gynecology;  Laterality: N/A;  . DOPPLER ECHOCARDIOGRAPHY  02/12/2013   LV FUNCTION, SIZE NORMAL; MILD CONCENTRIC LVH; EST EF 55-65%; WALL MOTION NORMAL  . EXTUBATION (ENDOTRACHEAL) IN OR N/A 09/23/2017   Procedure: EXTUBATION (ENDOTRACHEAL) IN OR;  Surgeon: Helayne Seminole, MD;  Location: [facility];  Service: ENT;  Laterality: N/A;  . FIBEROPTIC LARYNGOSCOPY AND TRACHEOSCOPY N/A 09/23/2017   Procedure: FLEXIBLE FIBEROPTIC LARYNGOSCOPY;  Surgeon: Helayne Seminole, MD;  Location: [place];  Service: ENT;  Laterality: N/A;  . FRACTURE SURGERY     foot  . HERNIA REPAIR    . I&D EXTREMITY Left 06/27/2015   Procedure: Partial Excision Left Calcaneus, Place Antibiotic Beads, and Wound VAC;  Surgeon: Newt Minion, MD;  Location: [facility];  Service: Orthopedics;  Laterality: Left;  . KNEE ARTHROSCOPY     right  . LEFT AND RIGHT HEART CATHETERIZATION WITH CORONARY ANGIOGRAM N/A 04/23/2013   Procedure: LEFT AND RIGHT HEART CATHETERIZATION WITH CORONARY ANGIOGRAM;  Surgeon: Leonie Man, MD;  Location: [facility];  Service: Cardiovascular;  Laterality: N/A;  . LEFT HEART CATH AND CORONARY ANGIOGRAPHY N/A 03/11/2017   Procedure: Left Heart Cath and Coronary Angiography;  Surgeon: Sherren Mocha, MD;  Location: [place] [facility];  Service: Cardiovascular;  Laterality: N/A;  . LexiScan Myoview  03/09/2013   EF 50%; NORMAL MYOCARDIAL PERFUSION STUDY - breast attenuation  . METATARSAL OSTEOTOMY WITH  OPEN REDUCTION INTERNAL FIXATION (ORIF) METATARSAL WITH FUSION Left 04/09/2014   Procedure: LEFT FOOT FRACTURE OPEN TREATMENT METATARSAL INCLUDES INTERNAL FIXATION EACH;  Surgeon: Lorn Junes, MD;  Location: [place];  Service: Orthopedics;  Laterality: Left;  . NISSEN FUNDOPLICATION  3354  . Right and left CARDIAC CATHETERIZATION  04/23/2013   Angiographic normal coronaries; LVEDP 20 mmHg, PCWP 12-14 mmHg, RAP 12 mmHg.; Fick CO/CI 4.9/2.2  . TOTAL KNEE ARTHROPLASTY Right 06/29/2017   Procedure: RIGHT TOTAL KNEE ARTHROPLASTY;  Surgeon: Newt Minion, MD;  Location: [place];  Service: Orthopedics;  Laterality: Right;  . TRACHEOSTOMY TUBE PLACEMENT N/A 09/20/2017   Procedure: AWAKE INTUBATION WITH ANESTHESIA WITH VIDEO ASSISTANCE;  Surgeon: Helayne Seminole, MD;  Location: [place];  Service: ENT;  Laterality: N/A;  . TUBAL LIGATION     with reversal in 1994  . VENTRAL HERNIA REPAIR      Current Facility-Administered Medications  Medication Dose Route Frequency Provider Last Rate Last Dose  . ceFAZolin (ANCEF) IVPB 2g/100 mL premix  2 g Intravenous On Call to OR Suzan Slick, NP      . lactated ringers infusion   Intravenous Continuous Newt Minion, MD 10 mL/hr at 12/07/17 [phone]  Allergies  Allergen Reactions  . Methotrexate Other (See Comments)    Peri-oral and buccal lesions  . Vancomycin Other (See Comments)    DOSE RELATED NEPHROTOXICITY  . Lisinopril Cough  . Chlorhexidine Itching  . Clindamycin/Lincomycin Nausea And Vomiting and Rash    RASH > ALLERGY INTOLERANCE > NAUSEA & VOMITING  . Doxycycline Rash  . Teflaro [Ceftaroline] Rash    Tolerates ceftriaxone     Social History   Tobacco Use  . Smoking status: Never Smoker  . Smokeless tobacco: Never Used  Substance Use Topics  . Alcohol use: No    Alcohol/week: 0.0 oz    Family History  Problem Relation Age of Onset  . Diabetes Father   . Heart attack Father 57  . Coronary artery disease Father    . Heart failure Father   . COPD Mother   . Emphysema Mother   . Asthma Mother   . Heart failure Mother   . Breast cancer Mother   . Heart attack Maternal Grandfather   . Sarcoidosis Maternal Uncle   . Lung cancer Brother   . Diabetes Brother   . Colon cancer Neg Hx      Review of Systems  All other systems reviewed and are negative.   Objective:  Physical Exam  Vital signs in last 24 hours: Temp:  [98 F (36.7 C)-98.2 F (36.8 C)] 98 F (36.7 C) (02/27 0600) Pulse Rate:  [73] 73 (02/27 0600) Resp:  [20] 20 (02/27 0600) BP: (84-122)/(40-66) 115/40 (02/27 0600) SpO2:  [96 %-99 %] 99 % (02/27 0600) Weight:  [247 lb (112 kg)-247 lb 9.6 oz (112.3 kg)] 247 lb (112 kg) (02/27 0627)  Labs:   Estimated body mass index is 39.87 kg/m as calculated from the following:   Height as of this encounter: 5\' 6"  (1.676 m).   Weight as of this encounter: 247 lb (112 kg).   Imaging Review Plain radiographs demonstrate moderate degenerative joint disease of the left knee(s). The overall alignment ismild varus. The bone quality appears to be adequate for age and reported activity level.  Assessment/Plan:  End stage arthritis, left knee   The patient history, physical examination, clinical judgment of the provider and imaging studies are consistent with end stage degenerative joint disease of the left knee(s) and total knee arthroplasty is deemed medically necessary. The treatment options including medical management, injection therapy arthroscopy and arthroplasty were discussed at length. The risks and benefits of total knee arthroplasty were presented and reviewed. The risks due to aseptic loosening, infection, stiffness, patella tracking problems, thromboembolic complications and other imponderables were discussed. The patient acknowledged the explanation, agreed to proceed with the plan and consent was signed. Patient is being admitted for inpatient treatment for surgery, pain control,  PT, OT, prophylactic antibiotics, VTE prophylaxis, progressive ambulation and ADL's and discharge planning. The patient is planning to be discharged home with home health services

## 2017-12-07 NOTE — Plan of Care (Signed)
  Progressing Education: Knowledge of General Education information will improve 12/07/2017 1712 - Progressing by Rance Muir, RN Health Behavior/Discharge Planning: Ability to manage health-related needs will improve 12/07/2017 1712 - Progressing by Rance Muir, RN Clinical Measurements: Ability to maintain clinical measurements within normal limits will improve 12/07/2017 1712 - Progressing by Rance Muir, RN Will remain free from infection 12/07/2017 1712 - Progressing by Rance Muir, RN Diagnostic test results will improve 12/07/2017 1712 - Progressing by Rance Muir, RN Respiratory complications will improve 12/07/2017 1712 - Progressing by Rance Muir, RN Cardiovascular complication will be avoided 12/07/2017 1712 - Progressing by Rance Muir, RN Activity: Risk for activity intolerance will decrease 12/07/2017 1712 - Progressing by Rance Muir, RN Nutrition: Adequate nutrition will be maintained 12/07/2017 1712 - Progressing by Rance Muir, RN Coping: Level of anxiety will decrease 12/07/2017 1712 - Progressing by Rance Muir, RN Elimination: Will not experience complications related to bowel motility 12/07/2017 1712 - Progressing by Rance Muir, RN Will not experience complications related to urinary retention 12/07/2017 1712 - Progressing by Rance Muir, RN Pain Managment: General experience of comfort will improve 12/07/2017 1712 - Progressing by Rance Muir, RN Safety: Ability to remain free from injury will improve 12/07/2017 1712 - Progressing by Rance Muir, RN Skin Integrity: Risk for impaired skin integrity will decrease 12/07/2017 1712 - Progressing by Rance Muir, RN

## 2017-12-08 ENCOUNTER — Encounter (HOSPITAL_COMMUNITY): Payer: Self-pay | Admitting: Orthopedic Surgery

## 2017-12-08 LAB — GLUCOSE, CAPILLARY
Glucose-Capillary: 111 mg/dL — ABNORMAL HIGH (ref 65–99)
Glucose-Capillary: 200 mg/dL — ABNORMAL HIGH (ref 65–99)
Glucose-Capillary: 196 mg/dL — ABNORMAL HIGH (ref 65–99)
Glucose-Capillary: 175 mg/dL — ABNORMAL HIGH (ref 65–99)
Glucose-Capillary: 218 mg/dL — ABNORMAL HIGH (ref 65–99)

## 2017-12-08 NOTE — Plan of Care (Signed)
  Progressing Education: Knowledge of General Education information will improve 12/08/2017 1016 - Progressing by Rance Muir, RN Health Behavior/Discharge Planning: Ability to manage health-related needs will improve 12/08/2017 1016 - Progressing by Rance Muir, RN Clinical Measurements: Ability to maintain clinical measurements within normal limits will improve 12/08/2017 1016 - Progressing by Rance Muir, RN Will remain free from infection 12/08/2017 1016 - Progressing by Rance Muir, RN Diagnostic test results will improve 12/08/2017 1016 - Progressing by Rance Muir, RN Respiratory complications will improve 12/08/2017 1016 - Progressing by Rance Muir, RN Cardiovascular complication will be avoided 12/08/2017 1016 - Progressing by Rance Muir, RN Activity: Risk for activity intolerance will decrease 12/08/2017 1016 - Progressing by Rance Muir, RN Nutrition: Adequate nutrition will be maintained 12/08/2017 1016 - Progressing by Rance Muir, RN Coping: Level of anxiety will decrease 12/08/2017 1016 - Progressing by Rance Muir, RN Elimination: Will not experience complications related to bowel motility 12/08/2017 1016 - Progressing by Rance Muir, RN Will not experience complications related to urinary retention 12/08/2017 1016 - Progressing by Rance Muir, RN Pain Managment: General experience of comfort will improve 12/08/2017 1016 - Progressing by Rance Muir, RN Skin Integrity: Risk for impaired skin integrity will decrease 12/08/2017 1016 - Progressing by Rance Muir, RN

## 2017-12-08 NOTE — Progress Notes (Signed)
Patient ID: [name], female   DOB: August 29, 1961, 57 y.o.   MRN: [id number] Patient states she had difficulty sleeping last night due to pain from her knee.  She is postoperative day 1 total knee arthroplasty on the left.  Will anticipate discharge to home tomorrow Friday depending on her therapy the importance of knee extension was discussed.

## 2017-12-08 NOTE — Progress Notes (Signed)
Physical Therapy Treatment Patient Details Name: [name] MRN: [id number] DOB: 1961/01/02 Today's Date: 12/08/2017    History of Present Illness [name] a 57 y/o female s/p elective L TKA. PMH includes HTN, gout, pulmonary sarcoidosis, CHF, DM, COPD, and R TKA.     PT Comments    Patient limited by lethargy and falling asleep when sitting up on BSC. Pt tolerated short distance gait in room and requested to return to bed and attempt to take a nap. Continue to progress as tolerated.    Follow Up Recommendations  Follow surgeon's recommendation for DC plan and follow-up therapies;Supervision for mobility/OOB     Equipment Recommendations  None recommended by PT    Recommendations for Other Services       Precautions / Restrictions Precautions Precautions: Knee Precaution Booklet Issued: Yes (comment) Precaution Comments: knee precautions reviewed with pt  Restrictions Weight Bearing Restrictions: Yes LLE Weight Bearing: Weight bearing as tolerated    Mobility  Bed Mobility Overal bed mobility: Needs Assistance Bed Mobility: Supine to Sit;Sit to Supine     Supine to sit: Min guard Sit to supine: Min guard   General bed mobility comments: min guard for safety  Transfers Overall transfer level: Needs assistance Equipment used: Rolling walker (2 wheeled) Transfers: Sit to/from Stand Sit to Stand: Min assist         General transfer comment: assist to power up into standing with cues for safe hand placement from EOB and BSC  Ambulation/Gait Ambulation/Gait assistance: Min guard Ambulation Distance (Feet): (57ft then 15ft) Assistive device: Rolling walker (2 wheeled) Gait Pattern/deviations: Step-to pattern;Decreased weight shift to left;Antalgic;Decreased stance time - left;Decreased step length - right Gait velocity: decreased Gait velocity interpretation: Below normal speed for age/gender General Gait Details: cues for sequencing and L Heel strike/toe  off   Stairs            Wheelchair Mobility    Modified Rankin (Stroke Patients Only)       Balance Overall balance assessment: Needs assistance Sitting-balance support: No upper extremity supported;Feet supported Sitting balance-Leahy Scale: Good     Standing balance support: Bilateral upper extremity supported;During functional activity Standing balance-Leahy Scale: Poor Standing balance comment: Reliant on BUE support and external support at this time                             Cognition Arousal/Alertness: Lethargic;Suspect due to medications Behavior During Therapy: [facility] for tasks assessed/performed Overall Cognitive Status: Within Functional Limits for tasks assessed                                        Exercises Total Joint Exercises Quad Sets: AROM;Left;5 reps;Supine(multimodal cueing for quad activation) Short Arc Quad: (attempted, pt unable to activate quads) Hip ABduction/ADduction: AAROM;Left;10 reps;Supine Straight Leg Raises: AAROM;Left;10 reps;Supine    General Comments        Pertinent Vitals/Pain Pain Assessment: Faces Faces Pain Scale: Hurts little more Pain Location: L knee  Pain Descriptors / Indicators: Guarding;Sore Pain Intervention(s): Limited activity within patient's tolerance;Monitored during session;Premedicated before session;Repositioned    Home Living                      Prior Function            PT Goals (current goals can now be found in  the care plan section) Acute Rehab PT Goals Patient Stated Goal: to get better  PT Goal Formulation: With patient Time For Goal Achievement: 12/21/17 Potential to Achieve Goals: Good Progress towards PT goals: Progressing toward goals    Frequency    7X/week      PT Plan Current plan remains appropriate    Co-evaluation              AM-PAC PT "6 Clicks" Daily Activity  Outcome Measure  Difficulty turning over in bed (including  adjusting bedclothes, sheets and blankets)?: Unable Difficulty moving from lying on back to sitting on the side of the bed? : Unable Difficulty sitting down on and standing up from a chair with arms (e.g., wheelchair, bedside commode, etc,.)?: Unable Help needed moving to and from a bed to chair (including a wheelchair)?: A Little Help needed walking in hospital room?: A Little Help needed climbing 3-5 steps with a railing? : A Lot 6 Click Score: 11    End of Session Equipment Utilized During Treatment: Gait belt Activity Tolerance: Patient tolerated treatment well Patient left: with call bell/phone within reach;in bed Nurse Communication: Mobility status PT Visit Diagnosis: Unsteadiness on feet (R26.81);Other abnormalities of gait and mobility (R26.89);Pain Pain - Right/Left: Left Pain - part of body: Knee     Time: 1204-1227 PT Time Calculation (min) (ACUTE ONLY): 23 min  Charges:  $Gait Training: 8-22 mins $Therapeutic Activity: 8-22 mins                    G Codes:       Earney Navy, PTA Pager: [phone]     Darliss Cheney 12/08/2017, 4:44 PM

## 2017-12-08 NOTE — Progress Notes (Signed)
Inpatient Diabetes Program Recommendations  AACE/ADA: New Consensus Statement on Inpatient Glycemic Control (2015)  Target Ranges:  Prepandial:   less than 140 mg/dL      Peak postprandial:   less than 180 mg/dL (1-2 hours)      Critically ill patients:  140 - 180 mg/dL   Lab Results  Component Value Date   GLUCAP 200 (H) 12/08/2017   HGBA1C 8.4 (H) 11/29/2017    Review of Glycemic Control Results for CHIKA, CICHOWSKI (MRN [id number]) as of 12/08/2017 10:23  Ref. Range 12/07/2017 09:25 12/07/2017 16:35 12/07/2017 22:12 12/08/2017 06:11  Glucose-Capillary Latest Ref Range: 65 - 99 mg/dL 154 (H) 207 (H) 276 (H) 200 (H)   Diabetes history: Type 2 DM Outpatient Diabetes medications: Novolog 30-35 Units TIDAC, Toujeo 40 Units QHS, Metformin 1,000 mg BID Current orders for Inpatient glycemic control: Lantus 30 Units QHS, Metformin 1,000 mg BID, Novolog 0-15 Units TID, Novolog 4 Units [facility]  Inpatient Diabetes Program Recommendations:    Consider Increasing Lantus to 34 Units QHS.  Also, consider adding Novolog 0-5 Units HS.  Thanks, Bronson Curb, MSN, RNC-OB Diabetes Coordinator [phone] (8a-5p)

## 2017-12-08 NOTE — Progress Notes (Signed)
Physical Therapy Treatment Patient Details Name: [name] MRN: [id number] DOB: Mar 23, 1961 Today's Date: 12/08/2017    History of Present Illness [name] a 57 y/o female s/p elective L TKA. PMH includes HTN, gout, pulmonary sarcoidosis, CHF, DM, COPD, and R TKA.     PT Comments    [name] making slow progress towards goals. Currently limited by lightheadedness and quick to fatigue. On rising into sitting EOB pt c/o feeling "woozy". Feeling did not subside. Vitals taken and within normal limits. Pt agreeable to attempt standing and was able to ambulate 8 ft with chair close behind before requiring seated rest break secondary to increased lightheadedness. Pt advised to remain sitting up in recliner chair for an hour at minimum. Will continue to follow acutely to increase pt's activity tolerance and safety with mobility.    Follow Up Recommendations  Follow surgeon's recommendation for DC plan and follow-up therapies;Supervision for mobility/OOB     Equipment Recommendations  None recommended by PT    Recommendations for Other Services       Precautions / Restrictions Precautions Precautions: Knee Precaution Booklet Issued: Yes (comment) Restrictions Weight Bearing Restrictions: Yes LLE Weight Bearing: Weight bearing as tolerated    Mobility  Bed Mobility Overal bed mobility: Needs Assistance Bed Mobility: Supine to Sit     Supine to sit: Min assist     General bed mobility comments: Min A for LLE management. Use of bed rails and elevated HOB. Increased time/effort  Transfers Overall transfer level: Needs assistance Equipment used: Rolling walker (2 wheeled) Transfers: Sit to/from Stand Sit to Stand: Mod assist         General transfer comment: mod A to power up into standing. VC for hand placement.   Ambulation/Gait Ambulation/Gait assistance: Min guard Ambulation Distance (Feet): 8 Feet Assistive device: Rolling walker (2 wheeled) Gait Pattern/deviations:  Step-to pattern;Decreased step length - right;Decreased step length - left;Decreased stance time - left;Decreased stride length;Decreased weight shift to left;Antalgic Gait velocity: decreased Gait velocity interpretation: Below normal speed for age/gender General Gait Details: Pt with antalgic gait, avoiding WB through LLE despire cueing. Close min guard and chair behind secondary to pt c/o feeling "woozy"   Stairs            Wheelchair Mobility    Modified Rankin (Stroke Patients Only)       Balance Overall balance assessment: Needs assistance Sitting-balance support: No upper extremity supported;Feet supported Sitting balance-Leahy Scale: Good     Standing balance support: Bilateral upper extremity supported;During functional activity Standing balance-Leahy Scale: Poor Standing balance comment: Reliant on BUE support and external support at this time                             Cognition Arousal/Alertness: Awake/alert Behavior During Therapy: WFL for tasks assessed/performed Overall Cognitive Status: Within Functional Limits for tasks assessed                                        Exercises Total Joint Exercises Quad Sets: AROM;Left;5 reps;Supine(multimodal cueing for quad activation) Short Arc Quad: (attempted, pt unable to activate quads) Hip ABduction/ADduction: AAROM;Left;10 reps;Supine Straight Leg Raises: AAROM;Left;10 reps;Supine    General Comments        Pertinent Vitals/Pain Pain Assessment: Faces Faces Pain Scale: Hurts little more Pain Location: L knee  Pain Descriptors / Indicators:  Aching;Operative site guarding Pain Intervention(s): Limited activity within patient's tolerance;Monitored during session;Repositioned    Home Living                      Prior Function            PT Goals (current goals can now be found in the care plan section) Acute Rehab PT Goals Patient Stated Goal: to get better  PT  Goal Formulation: With patient Time For Goal Achievement: 12/21/17 Potential to Achieve Goals: Good Progress towards PT goals: Progressing toward goals(slowly)    Frequency    7X/week      PT Plan Current plan remains appropriate    Co-evaluation              AM-PAC PT "6 Clicks" Daily Activity  Outcome Measure  Difficulty turning over in bed (including adjusting bedclothes, sheets and blankets)?: Unable Difficulty moving from lying on back to sitting on the side of the bed? : Unable Difficulty sitting down on and standing up from a chair with arms (e.g., wheelchair, bedside commode, etc,.)?: Unable Help needed moving to and from a bed to chair (including a wheelchair)?: A Little Help needed walking in hospital room?: A Little Help needed climbing 3-5 steps with a railing? : A Lot 6 Click Score: 11    End of Session Equipment Utilized During Treatment: Gait belt Activity Tolerance: Other (comment)(Lightheaded) Patient left: in chair;with call bell/phone within reach;with family/visitor present Nurse Communication: Mobility status PT Visit Diagnosis: Unsteadiness on feet (R26.81);Other abnormalities of gait and mobility (R26.89);Pain Pain - Right/Left: Left Pain - part of body: Knee     Time: 9450-3888 PT Time Calculation (min) (ACUTE ONLY): 32 min  Charges:  $Gait Training: 8-22 mins $Therapeutic Exercise: 8-22 mins                    G Codes:       Benjiman Core, [place] Pager [phone] Acute Rehab   Allena Katz 12/08/2017, 4:03 PM

## 2017-12-08 NOTE — Evaluation (Signed)
Occupational Therapy Evaluation Patient Details Name: [name] MRN: [id number] DOB: April 11, 1961 Today's Date: 12/08/2017    History of Present Illness [name] a 57 y/o female s/p elective L TKA. PMH includes HTN, gout, pulmonary sarcoidosis, CHF, DM, COPD, and R TKA.    Clinical Impression   This 57 y/o F presents with the above. At baseline [name] independent with ADLs and functional mobility. Pt completed stand pivot transfer and brief functional mobility within room at RW level with MinA throughout; currently requires MaxA for LB ADLs. Pt will return home with spouse who she reports is able to assist with ADLs PRN. Pt will benefit from continued acute OT services to maximize her overall safety and independence with ADLs and mobility prior to return home.     Follow Up Recommendations  Follow surgeon's recommendation for DC plan and follow-up therapies;Supervision/Assistance - 24 hour    Equipment Recommendations  None recommended by OT;Other (comment)(pt's DME needs are met )           Precautions / Restrictions Precautions Precautions: Knee Precaution Comments: verbally reviewed knee precautions.  Restrictions Weight Bearing Restrictions: Yes LLE Weight Bearing: Weight bearing as tolerated      Mobility Bed Mobility Overal bed mobility: Needs Assistance Bed Mobility: Supine to Sit     Supine to sit: Min assist     General bed mobility comments: Min A for LLE management. Use of bed rails and elevated HOB. Increased time/effort  Transfers Overall transfer level: Needs assistance Equipment used: Rolling walker (2 wheeled) Transfers: Sit to/from [facility] Sit to Stand: Min assist Stand pivot transfers: Min assist       General transfer comment: verbal cues for hand placement; assist to rise and steady at RW     Balance Overall balance assessment: Needs assistance Sitting-balance support: No upper extremity supported;Feet supported Sitting  balance-Leahy Scale: Good     Standing balance support: Bilateral upper extremity supported;During functional activity Standing balance-Leahy Scale: Poor Standing balance comment: Reliant on BUE support and external support at this time                            ADL either performed or assessed with clinical judgement   ADL Overall ADL's : Needs assistance/impaired Eating/Feeding: Modified independent;Sitting   Grooming: Wash/dry hands;Set up;Sitting   Upper Body Bathing: Min guard;Sitting   Lower Body Bathing: Minimal assistance;Sit to/from stand   Upper Body Dressing : Set up;Sitting   Lower Body Dressing: Maximal assistance;Sit to/from stand   Toilet Transfer: Minimal assistance;Stand-pivot;BSC;RW   Toileting- Clothing Manipulation and Hygiene: Minimal assistance;Sit to/from stand Toileting - Clothing Manipulation Details (indicate cue type and reason): steadying assist in standing while pt performs peri-care, assist for gown management     Functional mobility during ADLs: Minimal assistance;Rolling walker General ADL Comments: Pt completing stand pivot EOB>BSC, completing toileting ADLs and then ambulating approx 4' to recliner                         Pertinent Vitals/Pain Pain Assessment: Faces Faces Pain Scale: Hurts even more Pain Location: R knee  Pain Descriptors / Indicators: Aching;Operative site guarding Pain Intervention(s): Monitored during session;Repositioned     Hand Dominance Right   Extremity/Trunk Assessment Upper Extremity Assessment Upper Extremity Assessment: Overall WFL for tasks assessed   Lower Extremity Assessment Lower Extremity Assessment: Defer to PT evaluation   Cervical / Trunk Assessment Cervical /  Trunk Assessment: Normal   Communication Communication Communication: No difficulties   Cognition Arousal/Alertness: Awake/alert Behavior During Therapy: WFL for tasks assessed/performed Overall Cognitive Status:  Within Functional Limits for tasks assessed                                                      Home Living Family/patient expects to be discharged to:: Private residence Living Arrangements: Spouse/significant other Available Help at Discharge: Family Type of Home: House Home Access: Stairs to enter Technical brewer of Steps: 2 Entrance Stairs-Rails: Right Home Layout: One level     Bathroom Shower/Tub: Teacher, early years/pre: [place]: Environmental consultant - 2 wheels;Bedside commode;Tub bench   Additional Comments: Pt reports husband will be home to assist in the morning and at night, reports someone will be staying with her during the day while spouse is at work                          OT Problem List: Decreased strength;Decreased activity tolerance;Decreased range of motion;Impaired balance (sitting and/or standing);Decreased knowledge of use of DME or AE      OT Treatment/Interventions: Self-care/ADL training;DME and/or AE instruction;Therapeutic activities;Balance training;Therapeutic exercise;Patient/family education    OT Goals(Current goals can be found in the care plan section) Acute Rehab OT Goals Patient Stated Goal: to get better  OT Goal Formulation: With patient Time For Goal Achievement: 12/22/17 Potential to Achieve Goals: Good  OT Frequency: Min 2X/week                             AM-PAC PT "6 Clicks" Daily Activity     Outcome Measure Help from another person eating meals?: None Help from another person taking care of personal grooming?: A Little Help from another person toileting, which includes using toliet, bedpan, or urinal?: A Little Help from another person bathing (including washing, rinsing, drying)?: A Little Help from another person to put on and taking off regular upper body clothing?: None Help from another person to put on and taking off regular lower body clothing?: A  Lot 6 Click Score: 19   End of Session Equipment Utilized During Treatment: Gait belt;Rolling walker Nurse Communication: Mobility status  Activity Tolerance: Patient tolerated treatment well Patient left: in chair;with call bell/phone within reach  OT Visit Diagnosis: Other abnormalities of gait and mobility (R26.89)                Time: 7494-4967 OT Time Calculation (min): 30 min Charges:  OT General Charges $OT Visit: 1 Visit OT Evaluation $OT Eval Low Complexity: 1 Low OT Treatments $Self Care/Home Management : 8-22 mins G-Codes:     Lou Cal, OT Pager [phone] 12/08/2017   Raymondo Band 12/08/2017, 11:54 AM

## 2017-12-09 ENCOUNTER — Other Ambulatory Visit: Payer: Self-pay

## 2017-12-09 LAB — GLUCOSE, CAPILLARY
Glucose-Capillary: 175 mg/dL — ABNORMAL HIGH (ref 65–99)
Glucose-Capillary: 233 mg/dL — ABNORMAL HIGH (ref 65–99)
Glucose-Capillary: 222 mg/dL — ABNORMAL HIGH (ref 65–99)
Glucose-Capillary: 214 mg/dL — ABNORMAL HIGH (ref 65–99)

## 2017-12-09 MED ORDER — HYDROCODONE-ACETAMINOPHEN 5-325 MG PO TABS
1.0000 | ORAL_TABLET | Freq: Four times a day (QID) | ORAL | Status: DC | PRN
  Administered 2017-12-09 – 2017-12-10 (×3): 2 via ORAL
  Administered 2017-12-10: 1 via ORAL
  Administered 2017-12-10: 2 via ORAL
  Filled 2017-12-09 (×5): qty 2

## 2017-12-09 MED ORDER — HYDROCODONE-ACETAMINOPHEN 5-325 MG PO TABS: 1.0000 | ORAL_TABLET | ORAL | 0 refills | Status: DC | PRN

## 2017-12-09 MED ORDER — BUPROPION HCL ER (XL) 300 MG PO TB24: 300.0000 mg | ORAL_TABLET | Freq: Every day | ORAL | 3 refills | Status: DC

## 2017-12-09 NOTE — Progress Notes (Signed)
Physical Therapy Treatment Patient Details Name: [name] MRN: [id number] DOB: 1960-10-27 Today's Date: 12/09/2017    History of Present Illness [name] a 57 y/o female s/p elective L TKA. PMH includes HTN, gout, pulmonary sarcoidosis, CHF, DM, COPD, and R TKA.     PT Comments    Patient was able to safely negotiate stairs simulating home entrance. Pt with limited ambulation this session due to c/o feeling hot/flushed and nauseated. Pt reported feeling hot and flushed PTA with mobility but not nauseated. Pt agreeable to LE therex and tolerated well. Continue to progress as tolerated.    Follow Up Recommendations  Follow surgeon's recommendation for DC plan and follow-up therapies;Supervision for mobility/OOB     Equipment Recommendations  None recommended by PT    Recommendations for Other Services       Precautions / Restrictions Precautions Precautions: Knee Precaution Comments: knee precautions reviewed with pt  Restrictions Weight Bearing Restrictions: Yes LLE Weight Bearing: Weight bearing as tolerated    Mobility  Bed Mobility Overal bed mobility: Needs Assistance Bed Mobility: Supine to Sit     Supine to sit: Min guard;HOB elevated Sit to supine: Min assist   General bed mobility comments: pt received in ortho gym at end of OT session  Transfers Overall transfer level: Needs assistance Equipment used: Rolling walker (2 wheeled) Transfers: Sit to/from Stand Sit to Stand: Min guard         General transfer comment: min guard for safety  Ambulation/Gait Ambulation/Gait assistance: Supervision Ambulation Distance (Feet): (short distance in ortho gym) Assistive device: Rolling walker (2 wheeled) Gait Pattern/deviations: Decreased stance time - left;Decreased step length - right;Step-to pattern;Antalgic     General Gait Details: cues for step length symmetry and L heel strike   Stairs Stairs: Yes   Stair Management: One rail Left;Step to  pattern;Sideways Number of Stairs: 2 General stair comments: cues for sequencing and technique  Wheelchair Mobility    Modified Rankin (Stroke Patients Only)       Balance Overall balance assessment: Needs assistance Sitting-balance support: No upper extremity supported;Feet supported Sitting balance-Leahy Scale: Good     Standing balance support: Bilateral upper extremity supported;During functional activity;Single extremity supported Standing balance-Leahy Scale: Poor Standing balance comment: reliant on UE support for mobility                             Cognition Arousal/Alertness: Awake/alert Behavior During Therapy: WFL for tasks assessed/performed Overall Cognitive Status: Within Functional Limits for tasks assessed                                 General Comments: pt continues to appear drowsy and c/o feeling hot/flushed with mobility and nauseated      Exercises Total Joint Exercises Quad Sets: AROM;Left;10 reps Short Arc Quad: AROM;Left;10 reps Heel Slides: AAROM;Left;10 reps Hip ABduction/ADduction: Left;10 reps;AROM Straight Leg Raises: AAROM;Left;10 reps    General Comments General comments (skin integrity, edema, etc.): pt reporting feeling "hot" after tub transfer, denies dizziness/lightheadedness; provided water and RN made aware      Pertinent Vitals/Pain Pain Assessment: Faces Faces Pain Scale: Hurts little more Pain Location: L knee  Pain Descriptors / Indicators: Guarding;Sore Pain Intervention(s): Limited activity within patient's tolerance;Monitored during session;Premedicated before session;Repositioned    Home Living  Prior Function            PT Goals (current goals can now be found in the care plan section) Acute Rehab PT Goals Patient Stated Goal: to get better  PT Goal Formulation: With patient Time For Goal Achievement: 12/21/17 Potential to Achieve Goals: Good Progress  towards PT goals: Progressing toward goals    Frequency    7X/week      PT Plan Current plan remains appropriate    Co-evaluation              AM-PAC PT "6 Clicks" Daily Activity  Outcome Measure  Difficulty turning over in bed (including adjusting bedclothes, sheets and blankets)?: A Little Difficulty moving from lying on back to sitting on the side of the bed? : Unable Difficulty sitting down on and standing up from a chair with arms (e.g., wheelchair, bedside commode, etc,.)?: Unable Help needed moving to and from a bed to chair (including a wheelchair)?: A Little Help needed walking in hospital room?: A Little Help needed climbing 3-5 steps with a railing? : A Little 6 Click Score: 14    End of Session Equipment Utilized During Treatment: Gait belt Activity Tolerance: Patient tolerated treatment well Patient left: with call bell/phone within reach;in chair Nurse Communication: Mobility status PT Visit Diagnosis: Unsteadiness on feet (R26.81);Other abnormalities of gait and mobility (R26.89);Pain Pain - Right/Left: Left Pain - part of body: Knee     Time: 1610-9604 PT Time Calculation (min) (ACUTE ONLY): 21 min  Charges:  $Therapeutic Exercise: 8-22 mins                    G Codes:       Earney Navy, PTA Pager: [phone]     Darliss Cheney 12/09/2017, 3:22 PM

## 2017-12-09 NOTE — Progress Notes (Signed)
Inpatient Diabetes Program Recommendations  AACE/ADA: New Consensus Statement on Inpatient Glycemic Control (2015)  Target Ranges:  Prepandial:   less than 140 mg/dL      Peak postprandial:   less than 180 mg/dL (1-2 hours)      Critically ill patients:  140 - 180 mg/dL   Lab Results  Component Value Date   GLUCAP 233 (H) 12/09/2017   HGBA1C 8.4 (H) 11/29/2017    Review of Glycemic Control Results for [name], [name] (MRN [id number]) as of 12/09/2017 13:29  Ref. Range 12/08/2017 11:34 12/08/2017 16:44 12/08/2017 22:42 12/09/2017 07:31 12/09/2017 12:38  Glucose-Capillary Latest Ref Range: 65 - 99 mg/dL 196 (H) 175 (H) 218 (H) 175 (H) 233 (H)   Diabetes history: Type 2 DM Outpatient Diabetes medications: Novolog 30-35 Units TIDAC, Toujeo 40 Units QHS, Metformin 1,000 mg BID Current orders for Inpatient glycemic control: Lantus 30 Units QHS, Metformin 1,000 mg BID, Novolog 0-15 Units TID, Novolog 4 Units [facility]  Inpatient Diabetes Program Recommendations:   Postprandial CBG elevated today @ noon, but did not receive am doses of Novolog insulin for correction nor meal coverage per [employer]. Continue to follow trends this pm to evaluate insulin dosing adjustments needed.  Thank you, Nani Gasser. Chakia Counts, RN, MSN, CDE  Diabetes Coordinator Inpatient Glycemic Control Team Team Pager [phone] (8am-5pm) 12/09/2017 1:33 PM

## 2017-12-09 NOTE — Discharge Summary (Addendum)
Discharge Diagnoses:  Active Problems:   S/P total knee arthroplasty   Surgeries: Procedure(s): LEFT TOTAL KNEE ARTHROPLASTY on 12/07/2017    Consultants:   Discharged Condition: Improved  Hospital Course: [name] is an 57 y.o. female who was admitted 12/07/2017 with a chief complaint of osteoarthritis left knee, with a final diagnosis of Osteoarthritis Left Knee.  Patient was brought to the operating room on 12/07/2017 and underwent Procedure(s): LEFT TOTAL KNEE ARTHROPLASTY.    Patient was given perioperative antibiotics:  Anti-infectives (From admission, onward)   Start     Dose/Rate Route Frequency Ordered Stop   12/07/17 1700  ceFAZolin (ANCEF) IVPB 2g/100 mL premix     2 g 200 mL/hr over 30 Minutes Intravenous Every 6 hours 12/07/17 1533 12/08/17 0012   12/07/17 0600  vancomycin (VANCOCIN) IVPB 1000 mg/200 mL premix     1,000 mg 200 mL/hr over 60 Minutes Intravenous  Once 12/07/17 0556 12/07/17 0720   12/07/17 0556  ceFAZolin (ANCEF) IVPB 2g/100 mL premix     2 g 200 mL/hr over 30 Minutes Intravenous On call to O.R. 12/07/17 0109 12/07/17 0759    .  Patient was given sequential compression devices, early ambulation, and aspirin for DVT prophylaxis.  Recent vital signs:  Patient Vitals for the past 24 hrs:  BP Temp Temp src Pulse Resp SpO2  12/10/17 0356 (!) 129/45 98.4 F (36.9 C) Oral 82 - 95 %  12/09/17 2227 (!) 111/52 98.7 F (37.1 C) Oral 61 - 96 %  12/09/17 1433 138/68 98.7 F (37.1 C) Oral 86 17 95 %  .  Recent laboratory studies: No results found.  Discharge Medications:   Allergies as of 12/10/2017      Reactions   Methotrexate Other (See Comments)   Peri-oral and buccal lesions   Vancomycin Other (See Comments)   DOSE RELATED NEPHROTOXICITY   Lisinopril Cough   Chlorhexidine Itching   Clindamycin/lincomycin Nausea And Vomiting, Rash   RASH > ALLERGY INTOLERANCE > NAUSEA & VOMITING   Doxycycline Rash   Teflaro [ceftaroline] Rash    Tolerates ceftriaxone       Medication List    STOP taking these medications   amoxicillin-clavulanate 875-125 MG tablet Commonly known as:  AUGMENTIN     TAKE these medications   albuterol 108 (90 Base) MCG/ACT inhaler Commonly known as:  PROVENTIL HFA;VENTOLIN HFA Inhale 1-2 puffs into the lungs every 6 (six) hours as needed for wheezing or shortness of breath.   allopurinol 100 MG tablet Commonly known as:  ZYLOPRIM Take 100 mg by mouth 2 (two) times daily.   aspirin EC 81 MG tablet Take 81 mg by mouth daily.   atorvastatin 40 MG tablet Commonly known as:  LIPITOR TAKE 1 TAB BY MOUTH ONCE DAILY What changed:  See the new instructions.   BAYER CONTOUR MONITOR w/Device Kit Use daily to check sugar   BAYER MICROLET LANCETS lancets Use as instructed to check sugar 3 times daily   benzonatate 200 MG capsule Commonly known as:  TESSALON Take 200 mg by mouth 3 (three) times daily as needed for cough.   BREO ELLIPTA 200-25 MCG/INH Aepb Generic drug:  fluticasone furoate-vilanterol Inhale 1 puff into the lungs daily.   BRISDELLE 7.5 MG Caps Generic drug:  PARoxetine Mesylate Take 7.5 mg by mouth daily.   buPROPion 300 MG 24 hr tablet Commonly known as:  WELLBUTRIN XL Take 1 tablet (300 mg total) by mouth daily.   Calcium Citrate 200 MG Tabs  Take 200 mg by mouth daily.   carvedilol 25 MG tablet Commonly known as:  COREG Take 1 tablet (25 mg total) by mouth 2 (two) times daily with a meal.   dexlansoprazole 60 MG capsule Commonly known as:  DEXILANT Take 1 capsule (60 mg total) by mouth daily.   diclofenac sodium 1 % Gel Commonly known as:  VOLTAREN APPLY 2 GRAMS TOPICALLY 4 TIMES A DAY What changed:  See the new instructions.   diltiazem 120 MG 24 hr capsule Commonly known as:  CARDIZEM CD Take 1 capsule (120 mg total) by mouth daily.   fenofibrate 145 MG tablet Commonly known as:  TRICOR Take 1 tablet (145 mg total) by mouth daily. NEED OV.    fluticasone 50 MCG/ACT nasal spray Commonly known as:  FLONASE Place 2 sprays into both nostrils daily. What changed:    when to take this  reasons to take this   furosemide 40 MG tablet Commonly known as:  LASIX Take 1 tablet (40 mg total) by mouth 2 (two) times daily.   gabapentin 100 MG capsule Commonly known as:  NEURONTIN TAKE 1 CAPSULE BY MOUTH THREE TIMES A DAY What changed:  See the new instructions.   glucose blood test strip Commonly known as:  BAYER CONTOUR TEST Use as instructed to check sugar 3 times daily   HYDROcodone-acetaminophen 5-325 MG tablet Commonly known as:  NORCO/VICODIN Take 1 tablet by mouth every 4 (four) hours as needed for moderate pain.   insulin aspart 100 UNIT/ML FlexPen Commonly known as:  NOVOLOG FLEXPEN Inject 30-35 Units into the skin 3 (three) times daily with meals.   Insulin Glargine 300 UNIT/ML Sopn Commonly known as:  TOUJEO SOLOSTAR Inject 40 Units into the skin at bedtime.   Insulin Pen Needle 33G X 4 MM Misc 1 each by Does not apply route 4 (four) times daily.   LORazepam 0.5 MG tablet Commonly known as:  ATIVAN Take 0.5 mg by mouth every 12 (twelve) hours as needed for anxiety.   metFORMIN 1000 MG tablet Commonly known as:  GLUCOPHAGE Take 1 tablet (1,000 mg total) by mouth 2 (two) times daily with a meal.   nitroGLYCERIN 0.4 MG SL tablet Commonly known as:  NITROSTAT Place 1 tablet (0.4 mg total) under the tongue every 5 (five) minutes as needed for chest pain. Reported on 01/05/2016   ondansetron 4 MG disintegrating tablet Commonly known as:  ZOFRAN-ODT Take 4 mg by mouth every 8 (eight) hours as needed for nausea or vomiting.   Potassium Chloride ER 20 MEQ Tbcr Take 1 tablet by mouth 3 (three) times daily. What changed:    how much to take  when to take this   predniSONE 5 MG tablet Commonly known as:  DELTASONE Take 5 mg by mouth daily with breakfast.   telmisartan 20 MG tablet Commonly known as:   MICARDIS Take 1 tablet (20 mg total) by mouth daily.   triamcinolone cream 0.1 % Commonly known as:  KENALOG Apply 1 application topically daily as needed (for irritation).   venlafaxine XR 75 MG 24 hr capsule Commonly known as:  EFFEXOR-XR Take 2 capsules (150 mg total) by mouth at bedtime. What changed:    how much to take  when to take this   VITAMIN B-12 PO Take 2,500 mcg by mouth daily.   Vitamin D 2000 units tablet Take 2,000 Units by mouth daily.       Diagnostic Studies: US Breast Ltd Uni Right Inc Axilla  Result Date: 12/01/2017 CLINICAL DATA:  Short-term followup for a probable area fat necrosis in the right breast. Patient is also complaining of global left breast pain. EXAM: DIGITAL DIAGNOSTIC BILATERAL MAMMOGRAM WITH CAD AND TOMO ULTRASOUND RIGHT BREAST COMPARISON:  Previous exam(s). ACR Breast Density Category b: There are scattered areas of fibroglandular density. FINDINGS: There are no discrete masses, areas of architectural distortion, areas of significant asymmetry or suspicious calcifications. Mammographic images were processed with CAD. On physical exam, no discrete mass is palpated in the upper right breast. Targeted right breast ultrasound is performed, showing an area of increased echogenicity just below the dermis, at the 11:30 o'clock position, 11 cm the nipple, containing a few small cystic areas. The overall area measures approximately 15 x 4 x 13 mm, decreased in size the prior study. The findings are consistent with the expected evolution of fat necrosis. IMPRESSION: 1. No evidence of breast malignancy. 2. Benign area of evolving fat necrosis in the right breast. RECOMMENDATION: Screening mammogram in one year.(Code:SM-B-01Y) I have discussed the findings and recommendations with the patient. Results were also provided in writing at the conclusion of the visit. If applicable, a reminder letter will be sent to the patient regarding the next appointment. BI-RADS  CATEGORY  2: Benign. Electronically Signed   By: Lajean Manes M.D.   On: 12/01/2017 11:53   Mm Diag Breast Tomo Bilateral  Result Date: 12/01/2017 CLINICAL DATA:  Short-term followup for a probable area fat necrosis in the right breast. Patient is also complaining of global left breast pain. EXAM: DIGITAL DIAGNOSTIC BILATERAL MAMMOGRAM WITH CAD AND TOMO ULTRASOUND RIGHT BREAST COMPARISON:  Previous exam(s). ACR Breast Density Category b: There are scattered areas of fibroglandular density. FINDINGS: There are no discrete masses, areas of architectural distortion, areas of significant asymmetry or suspicious calcifications. Mammographic images were processed with CAD. On physical exam, no discrete mass is palpated in the upper right breast. Targeted right breast ultrasound is performed, showing an area of increased echogenicity just below the dermis, at the 11:30 o'clock position, 11 cm the nipple, containing a few small cystic areas. The overall area measures approximately 15 x 4 x 13 mm, decreased in size the prior study. The findings are consistent with the expected evolution of fat necrosis. IMPRESSION: 1. No evidence of breast malignancy. 2. Benign area of evolving fat necrosis in the right breast. RECOMMENDATION: Screening mammogram in one year.(Code:SM-B-01Y) I have discussed the findings and recommendations with the patient. Results were also provided in writing at the conclusion of the visit. If applicable, a reminder letter will be sent to the patient regarding the next appointment. BI-RADS CATEGORY  2: Benign. Electronically Signed   By: Lajean Manes M.D.   On: 12/01/2017 11:53    Patient benefited maximally from their hospital stay and there were no complications.     Disposition: 01-Home or Self Care Discharge Instructions    Call MD / Call 911   Complete by:  As directed    If you experience chest pain or shortness of breath, CALL 911 and be transported to the hospital emergency room.   If you develope a fever above 101 F, pus (white drainage) or increased drainage or redness at the wound, or calf pain, call your surgeon's office.   Call MD / Call 911   Complete by:  As directed    If you experience chest pain or shortness of breath, CALL 911 and be transported to the hospital emergency room.  If you develope  a fever above 101 F, pus (white drainage) or increased drainage or redness at the wound, or calf pain, call your surgeon's office.   Constipation Prevention   Complete by:  As directed    Drink plenty of fluids.  Prune juice may be helpful.  You may use a stool softener, such as Colace (over the counter) 100 mg twice a day.  Use MiraLax (over the counter) for constipation as needed.   Constipation Prevention   Complete by:  As directed    Drink plenty of fluids.  Prune juice may be helpful.  You may use a stool softener, such as Colace (over the counter) 100 mg twice a day.  Use MiraLax (over the counter) for constipation as needed.   Diet - low sodium heart healthy   Complete by:  As directed    Diet - low sodium heart healthy   Complete by:  As directed    Increase activity slowly as tolerated   Complete by:  As directed    Increase activity slowly as tolerated   Complete by:  As directed      Follow-up Information    Newt Minion, MD Follow up in 2 week(s).   Specialty:  Orthopedic Surgery Contact information: [place] [address] [phone]        [facility].   Specialty:  [place] Why:  A representative from [facility] will contact you to arrange start date and time for your therapy.  Contact information: [address] [place] [address] [phone]            Signed: Newt Minion 12/10/2017, 9:06 AM

## 2017-12-09 NOTE — Progress Notes (Addendum)
Occupational Therapy Treatment Patient Details Name: [name] MRN: [id number] DOB: [birth year] Today's Date: 12/09/2017    History of present illness [name] a 57 y/o female s/p elective L TKA. PMH includes HTN, gout, pulmonary sarcoidosis, CHF, DM, COPD, and R TKA.    OT comments  Pt making progress towards OT goals. Completed room level functional mobility using RW with MinA this session and completing toileting and standing grooming ADLs with MinA throughout. Pt continues to require ModA for LB ADLs and with decreased activity tolerance, requesting return to bed at end of session. Will continue to follow acutely to progress pt's safety and independence with ADLs and mobility. Will plan to address safe tub transfer during next session.    Follow Up Recommendations  Follow surgeon's recommendation for DC plan and follow-up therapies;Supervision/Assistance - 24 hour    Equipment Recommendations  None recommended by OT;Other (comment)(DME needs are met )          Precautions / Restrictions Precautions Precautions: Knee Precaution Comments: knee precautions reviewed with pt  Restrictions Weight Bearing Restrictions: Yes LLE Weight Bearing: Weight bearing as tolerated       Mobility Bed Mobility Overal bed mobility: Needs Assistance Bed Mobility: Sit to Supine     Supine to sit: Min guard;HOB elevated Sit to supine: Min assist   General bed mobility comments: educated on use of hook technique for RLE to assist LLE onto bed, assist to support LEs during completion   Transfers Overall transfer level: Needs assistance Equipment used: Rolling walker (2 wheeled) Transfers: Sit to/from Stand Sit to Stand: Min assist         General transfer comment: assist to power up into standing with cues for safe hand placement     Balance Overall balance assessment: Needs assistance Sitting-balance support: No upper extremity supported;Feet supported Sitting balance-Leahy Scale:  Good     Standing balance support: Bilateral upper extremity supported;During functional activity;Single extremity supported Standing balance-Leahy Scale: Poor Standing balance comment: Reliant on BUE support during mobility, using wrist/forearm support during grooming ADLs while standing at sink                            ADL either performed or assessed with clinical judgement   ADL Overall ADL's : Needs assistance/impaired     Grooming: Wash/dry face;Oral care;Min guard;Minimal assistance;Standing Grooming Details (indicate cue type and reason): minA initially progressed to [employer] Transfer: Minimal assistance;Stand-pivot;BSC;RW   Toileting- Clothing Manipulation and Hygiene: Minimal assistance;Sit to/from stand Toileting - Clothing Manipulation Details (indicate cue type and reason): steadying assist in standing while pt performs peri-care     Functional mobility during ADLs: Minimal assistance;Min guard;Rolling walker General ADL Comments: pt completing stand pivot to [facility] for toileting, declining ambulating to bathroom for task; Pt does ambulate to sink in bathroom and completed standing grooming ADLs; pt able to verbalize technique for completing tub transfer, will benefit from practicing prior to d/c                        Cognition Arousal/Alertness: Awake/alert(drowsy but easy to arouse ) Behavior During Therapy: WFL for tasks assessed/performed Overall Cognitive Status: Within Functional Limits for tasks assessed  General Comments pt with c/o L LE bandage tight and itchy-RN aware    Pertinent Vitals/ Pain       Pain Assessment: Faces Faces Pain Scale: Hurts little more Pain Location: L knee  Pain Descriptors / Indicators: Guarding;Sore Pain Intervention(s): Monitored during session;Repositioned                                                           Frequency  Min 2X/week        Progress Toward Goals  OT Goals(current goals can now be found in the care plan section)  Progress towards OT goals: Progressing toward goals  Acute Rehab OT Goals Patient Stated Goal: to get better  OT Goal Formulation: With patient Time For Goal Achievement: 12/22/17 Potential to Achieve Goals: Good  Plan Discharge plan remains appropriate                     AM-PAC PT "6 Clicks" Daily Activity     Outcome Measure   Help from another person eating meals?: None Help from another person taking care of personal grooming?: A Little Help from another person toileting, which includes using toliet, bedpan, or urinal?: A Little Help from another person bathing (including washing, rinsing, drying)?: A Lot Help from another person to put on and taking off regular upper body clothing?: None Help from another person to put on and taking off regular lower body clothing?: A Lot 6 Click Score: 18    End of Session Equipment Utilized During Treatment: Gait belt;Rolling walker  OT Visit Diagnosis: Other abnormalities of gait and mobility (R26.89)   Activity Tolerance Patient tolerated treatment well   Patient Left in bed;with call bell/phone within reach   Nurse Communication Mobility status        Time: 0211-1735 OT Time Calculation (min): 24 min  Charges: OT General Charges $OT Visit: 1 Visit OT Treatments $Self Care/Home Management : 8-22 mins  Lou Cal, OT Pager [phone] 12/09/2017    Raymondo Band 12/09/2017, 1:22 PM

## 2017-12-09 NOTE — Progress Notes (Signed)
Occupational Therapy Treatment Patient Details Name: [name] MRN: [id number] DOB: Feb 05, 1961 Today's Date: 12/09/2017    History of present illness [name] a 57 y/o female s/p elective L TKA. PMH includes HTN, gout, pulmonary sarcoidosis, CHF, DM, COPD, and R TKA.    OT comments  Pt continuing to progress towards goals; focus on tub transfer using TTB this session with Pt completing with minA and min verbal cues for technique. Pt completing room level functional mobility with RW and toileting during session with MinGuard-MinA throughout. Will continue to follow while in acute setting to maximize pt's overall safety and independence with ADLs and mobility prior to discharge home.   Follow Up Recommendations  Follow surgeon's recommendation for DC plan and follow-up therapies;Supervision/Assistance - 24 hour    Equipment Recommendations  None recommended by OT;Other (comment)(DME needs are met )          Precautions / Restrictions Precautions Precautions: Knee Precaution Comments: knee precautions reviewed with pt  Restrictions Weight Bearing Restrictions: Yes LLE Weight Bearing: Weight bearing as tolerated       Mobility Bed Mobility Overal bed mobility: Needs Assistance Bed Mobility: Supine to Sit     Supine to sit: Min guard;HOB elevated Sit to supine: Min assist   General bed mobility comments: minguard for safety, increased time/effort   Transfers Overall transfer level: Needs assistance Equipment used: Rolling walker (2 wheeled) Transfers: Sit to/from Stand Sit to Stand: Min assist         General transfer comment: assist to power up into standing with cues for safe hand placement     Balance Overall balance assessment: Needs assistance Sitting-balance support: No upper extremity supported;Feet supported Sitting balance-Leahy Scale: Good     Standing balance support: Bilateral upper extremity supported;During functional activity;Single extremity  supported Standing balance-Leahy Scale: Poor Standing balance comment: reliant on UE support for mobility                            ADL either performed or assessed with clinical judgement   ADL Overall ADL's : Needs assistance/impaired     Grooming: Wash/dry face;Oral care;Min guard;Minimal assistance;Standing Grooming Details (indicate cue type and reason): minA initially progressed to [employer] Transfer: Minimal assistance;Stand-pivot;BSC;RW   Toileting- Clothing Manipulation and Hygiene: Minimal assistance;Sit to/from stand Toileting - Clothing Manipulation Details (indicate cue type and reason): steadying assist in standing while pt performs peri-care     Functional mobility during ADLs: Minimal assistance;Min guard;Rolling walker General ADL Comments: pt completing toileting and practicing tub transfer using TTB this session                        Cognition Arousal/Alertness: Awake/alert Behavior During Therapy: WFL for tasks assessed/performed Overall Cognitive Status: Within Functional Limits for tasks assessed                                                      General Comments pt reporting feeling "hot" after tub transfer, denies dizziness/lightheadedness; provided water and RN made aware    Pertinent Vitals/ Pain       Pain Assessment: Faces Faces Pain Scale: Hurts little more Pain Location: L  knee  Pain Descriptors / Indicators: Guarding;Sore Pain Intervention(s): Monitored during session                                                          Frequency  Min 2X/week        Progress Toward Goals  OT Goals(current goals can now be found in the care plan section)  Progress towards OT goals: Progressing toward goals  Acute Rehab OT Goals Patient Stated Goal: to get better  OT Goal Formulation: With patient Time For Goal Achievement: 12/22/17 Potential to  Achieve Goals: Good  Plan Discharge plan remains appropriate                     AM-PAC PT "6 Clicks" Daily Activity     Outcome Measure   Help from another person eating meals?: None Help from another person taking care of personal grooming?: A Little Help from another person toileting, which includes using toliet, bedpan, or urinal?: A Little Help from another person bathing (including washing, rinsing, drying)?: A Lot Help from another person to put on and taking off regular upper body clothing?: None Help from another person to put on and taking off regular lower body clothing?: A Lot 6 Click Score: 18    End of Session Equipment Utilized During Treatment: Gait belt;Rolling walker  OT Visit Diagnosis: Other abnormalities of gait and mobility (R26.89)   Activity Tolerance Patient tolerated treatment well   Patient Left Other (comment)(handoff to PT for tx session )   Nurse Communication Mobility status        Time: 3354-5625 OT Time Calculation (min): 20 min  Charges: OT General Charges $OT Visit: 1 Visit OT Treatments $Self Care/Home Management : 8-22 mins  Lou Cal, OT Pager [phone] 12/09/2017    Raymondo Band 12/09/2017, 2:29 PM

## 2017-12-09 NOTE — Progress Notes (Signed)
Physical Therapy Treatment Patient Details Name: [name] MRN: [id number] DOB: 10-09-1961 Today's Date: 12/09/2017    History of Present Illness [name] a 57 y/o female s/p elective L TKA. PMH includes HTN, gout, pulmonary sarcoidosis, CHF, DM, COPD, and R TKA.     PT Comments    Patient is making progress toward mobility goals and tolerated ambulating 47ft with min guard assist and RW. Pt with c/o lightheadedness end of session. Vitals in general comments below. Continue to progress as tolerated.    Follow Up Recommendations  Follow surgeon's recommendation for DC plan and follow-up therapies;Supervision for mobility/OOB     Equipment Recommendations  None recommended by PT    Recommendations for Other Services       Precautions / Restrictions Precautions Precautions: Knee Precaution Comments: knee precautions reviewed with pt  Restrictions Weight Bearing Restrictions: Yes LLE Weight Bearing: Weight bearing as tolerated    Mobility  Bed Mobility Overal bed mobility: Needs Assistance Bed Mobility: Supine to Sit     Supine to sit: Min guard;HOB elevated     General bed mobility comments: min guard for safety  Transfers Overall transfer level: Needs assistance Equipment used: Rolling walker (2 wheeled) Transfers: Sit to/from Stand Sit to Stand: Min assist         General transfer comment: assist to power up into standing with cues for safe hand placement from EOB   Ambulation/Gait Ambulation/Gait assistance: Min guard Ambulation Distance (Feet): 70 Feet Assistive device: Rolling walker (2 wheeled) Gait Pattern/deviations: Step-to pattern;Decreased weight shift to left;Antalgic;Decreased stance time - left;Decreased step length - right Gait velocity: decreased   General Gait Details: cues for sequencing, bilat step lengths, and increased R step height; pt intially shuffling R foot    Stairs            Wheelchair Mobility    Modified Rankin  (Stroke Patients Only)       Balance Overall balance assessment: Needs assistance Sitting-balance support: No upper extremity supported;Feet supported Sitting balance-Leahy Scale: Good     Standing balance support: Bilateral upper extremity supported;During functional activity Standing balance-Leahy Scale: Poor                              Cognition Arousal/Alertness: Awake/alert Behavior During Therapy: WFL for tasks assessed/performed Overall Cognitive Status: Within Functional Limits for tasks assessed                                 General Comments: drowsy with c/o lightheadedness end of session while seated; BP 149/54 (75), pulse 88, and SpO2 95% on RA      Exercises Total Joint Exercises Ankle Circles/Pumps: AROM;Both;10 reps Quad Sets: AROM;Left;10 reps Heel Slides: AAROM;Left;10 reps    General Comments General comments (skin integrity, edema, etc.): pt with c/o L LE bandage tight and itchy-RN aware      Pertinent Vitals/Pain Pain Assessment: Faces Faces Pain Scale: Hurts little more Pain Location: L knee  Pain Descriptors / Indicators: Guarding;Sore Pain Intervention(s): Limited activity within patient's tolerance;Monitored during session;Repositioned    Home Living                      Prior Function            PT Goals (current goals can now be found in the care plan section) Acute Rehab PT Goals  PT Goal Formulation: With patient Time For Goal Achievement: 12/21/17 Potential to Achieve Goals: Good Progress towards PT goals: Progressing toward goals    Frequency    7X/week      PT Plan Current plan remains appropriate    Co-evaluation              AM-PAC PT "6 Clicks" Daily Activity  Outcome Measure  Difficulty turning over in bed (including adjusting bedclothes, sheets and blankets)?: Unable Difficulty moving from lying on back to sitting on the side of the bed? : Unable Difficulty sitting down  on and standing up from a chair with arms (e.g., wheelchair, bedside commode, etc,.)?: Unable Help needed moving to and from a bed to chair (including a wheelchair)?: A Little Help needed walking in hospital room?: A Little Help needed climbing 3-5 steps with a railing? : A Lot 6 Click Score: 11    End of Session Equipment Utilized During Treatment: Gait belt Activity Tolerance: Patient tolerated treatment well Patient left: with call bell/phone within reach;in chair Nurse Communication: Mobility status PT Visit Diagnosis: Unsteadiness on feet (R26.81);Other abnormalities of gait and mobility (R26.89);Pain Pain - Right/Left: Left Pain - part of body: Knee     Time: 1610-9604 PT Time Calculation (min) (ACUTE ONLY): 33 min  Charges:  $Gait Training: 8-22 mins $Therapeutic Exercise: 8-22 mins                    G Codes:       Earney Navy, PTA Pager: [phone]     Darliss Cheney 12/09/2017, 10:20 AM

## 2017-12-09 NOTE — Care Management Note (Signed)
Case Management Note  Patient Details  Name: [name] MRN: [id number] Date of Birth: 1961/07/25  Subjective/Objective:  57 yr old female s/p left total knee arthroplasty.                Action/Plan: Case manager spoke with patient concerning discharge plan and DME. Patient was preoperatively setup with [facility], But she wants Agency she always uses- [facility]. Case manager called referral to Neoma Laming, [place] Liaison. Will notify [facility] Liaison of patient's preference. She will have family support at discharge.   Expected Discharge Date:  12/10/17               Expected Discharge Plan:  [place]  In-House Referral:  NA  Discharge planning Services  CM Consult  Post Acute Care Choice:  Home Health Choice offered to:  Patient  DME Arranged:  N/A(has RW, 3in1 and tub bench ) DME Agency:  NA  HH Arranged:  PT HH Agency:  [facility]  Status of Service:  Completed, signed off  If discussed at [place] of Stay Meetings, dates discussed:    Additional Comments:  Ninfa Meeker, RN 12/09/2017, 11:52 AM

## 2017-12-09 NOTE — Progress Notes (Signed)
Patient ID: [name], female   DOB: 04-23-1961, 57 y.o.   MRN: [id number] Postoperative day 2 total knee arthroplasty.  Patient with increased lethargy yesterday.  We will discontinue her Percocet and change her to Vicodin.  Plan for discharge to home tomorrow Saturday.

## 2017-12-10 LAB — GLUCOSE, CAPILLARY
Glucose-Capillary: 200 mg/dL — ABNORMAL HIGH (ref 65–99)
Glucose-Capillary: 281 mg/dL — ABNORMAL HIGH (ref 65–99)

## 2017-12-10 NOTE — Progress Notes (Signed)
Physical Therapy Treatment Patient Details Name: [name] MRN: [id number] DOB: 1960-11-04 Today's Date: 12/10/2017    History of Present Illness [name] a 57 y/o female s/p elective L TKA. PMH includes HTN, gout, pulmonary sarcoidosis, CHF, DM, COPD, and R TKA.     PT Comments    Patient continues to make progress toward mobility goals and reported feeling better this am. Pt tolerated increased gait distance and with improved gait mechanics. Current plan remains appropriate.    Follow Up Recommendations  Follow surgeon's recommendation for DC plan and follow-up therapies;Supervision for mobility/OOB     Equipment Recommendations  None recommended by PT    Recommendations for Other Services       Precautions / Restrictions Precautions Precautions: Knee Precaution Comments: knee precautions reviewed with pt  Restrictions Weight Bearing Restrictions: Yes LLE Weight Bearing: Weight bearing as tolerated    Mobility  Bed Mobility               General bed mobility comments: pt sitting EOB upon arrival  Transfers Overall transfer level: Needs assistance Equipment used: Rolling walker (2 wheeled) Transfers: Sit to/from Stand Sit to Stand: Supervision Stand pivot transfers: Min guard       General transfer comment: supervision for safety  Ambulation/Gait Ambulation/Gait assistance: Supervision Ambulation Distance (Feet): 160 Feet Assistive device: Rolling walker (2 wheeled) Gait Pattern/deviations: Decreased stance time - left;Decreased step length - right;Step-through pattern;Antalgic     General Gait Details: cues for breathing technique and step length symmetry   Stairs            Wheelchair Mobility    Modified Rankin (Stroke Patients Only)       Balance Overall balance assessment: Needs assistance Sitting-balance support: No upper extremity supported;Feet supported Sitting balance-Leahy Scale: Good     Standing balance support:  Bilateral upper extremity supported;During functional activity Standing balance-Leahy Scale: Fair Standing balance comment: pt is able to static stand without UE support                            Cognition Arousal/Alertness: Awake/alert Behavior During Therapy: WFL for tasks assessed/performed Overall Cognitive Status: Within Functional Limits for tasks assessed                                        Exercises      General Comments General comments (skin integrity, edema, etc.): husband present       Pertinent Vitals/Pain Pain Assessment: Faces Pain Score: 4  Faces Pain Scale: Hurts little more Pain Location: L knee  Pain Descriptors / Indicators: Sore Pain Intervention(s): Monitored during session;Premedicated before session;Repositioned    Home Living                      Prior Function            PT Goals (current goals can now be found in the care plan section) Acute Rehab PT Goals PT Goal Formulation: With patient Time For Goal Achievement: 12/21/17 Potential to Achieve Goals: Good Progress towards PT goals: Progressing toward goals    Frequency    7X/week      PT Plan Current plan remains appropriate    Co-evaluation              AM-PAC PT "6 Clicks" Daily Activity  Outcome Measure  Difficulty turning over in bed (including adjusting bedclothes, sheets and blankets)?: A Little Difficulty moving from lying on back to sitting on the side of the bed? : Unable Difficulty sitting down on and standing up from a chair with arms (e.g., wheelchair, bedside commode, etc,.)?: A Lot Help needed moving to and from a bed to chair (including a wheelchair)?: A Little Help needed walking in hospital room?: A Little Help needed climbing 3-5 steps with a railing? : A Little 6 Click Score: 15    End of Session Equipment Utilized During Treatment: Gait belt Activity Tolerance: Patient tolerated treatment well Patient left:  with call bell/phone within reach;in bed;with family/visitor present Nurse Communication: Mobility status PT Visit Diagnosis: Unsteadiness on feet (R26.81);Other abnormalities of gait and mobility (R26.89);Pain Pain - Right/Left: Left Pain - part of body: Knee     Time: 1937-9024 PT Time Calculation (min) (ACUTE ONLY): 15 min  Charges:  $Gait Training: 8-22 mins                    G Codes:       Earney Navy, PTA Pager: [phone]     Darliss Cheney 12/10/2017, 9:18 AM

## 2017-12-10 NOTE — Progress Notes (Signed)
Occupational Therapy Treatment Patient Details Name: [name] MRN: [id number] DOB: August 16, 1961 Today's Date: 12/10/2017    History of present illness [name] a 57 y/o female s/p elective L TKA. PMH includes HTN, gout, pulmonary sarcoidosis, CHF, DM, COPD, and R TKA.    OT comments  Pt. Continues to make gains with skilled OT. Able to amb. To/from b.room and perform toileting tasks.  Spouse present and reports he and his sister-in-law will be available to assist at home.  Both pt. And spouse declined tub transfer today stating they felt yesterdays practice was enough.  Reviewed DME and answered all questions.  Note d/c home later today.    Follow Up Recommendations  Follow surgeon's recommendation for DC plan and follow-up therapies;Supervision/Assistance - 24 hour    Equipment Recommendations  None recommended by OT;Other (comment)    Recommendations for Other Services      Precautions / Restrictions Precautions Precautions: Knee Restrictions LLE Weight Bearing: Weight bearing as tolerated       Mobility Bed Mobility               General bed mobility comments: pt. was seated eob beginning and end of session  Transfers Overall transfer level: Needs assistance Equipment used: Rolling walker (2 wheeled) Transfers: Sit to/from [facility] Sit to Stand: Min guard Stand pivot transfers: Min guard       General transfer comment: min guard for safety    Balance                                           ADL either performed or assessed with clinical judgement   ADL Overall ADL's : Needs assistance/impaired                         Toilet Transfer: Min guard;Ambulation;RW;BSC;Regular Glass blower/designer Details (indicate cue type and reason): 3n1 over commode Toileting- Clothing Manipulation and Hygiene: Min guard;Sit to/from Nurse, children's Details (indicate cue type and reason): pt. and spouse  declined tub transfer state they feel she did fine with it yesterday and have been through training for the other knee also Functional mobility during ADLs: Minimal assistance;Min guard;Rolling walker General ADL Comments: pts. spouse present and reports they have 2 3n1s and he plans on keeping one bedside for nighttime use and will place the 2nd one over hallway toilet.       Vision       Perception     Praxis      Cognition Arousal/Alertness: Awake/alert Behavior During Therapy: WFL for tasks assessed/performed Overall Cognitive Status: Within Functional Limits for tasks assessed                                          Exercises     Shoulder Instructions       General Comments      Pertinent Vitals/ Pain       Pain Assessment: 0-10 Pain Score: 4  Pain Location: L knee Pain Descriptors / Indicators: Aching Pain Intervention(s): Limited activity within patient's tolerance;Premedicated before session;Repositioned  Home Living  Prior Functioning/Environment              Frequency           Progress Toward Goals  OT Goals(current goals can now be found in the care plan section)  Progress towards OT goals: Progressing toward goals     Plan Discharge plan remains appropriate    Co-evaluation                 AM-PAC PT "6 Clicks" Daily Activity     Outcome Measure   Help from another person eating meals?: None Help from another person taking care of personal grooming?: A Little Help from another person toileting, which includes using toliet, bedpan, or urinal?: A Little Help from another person bathing (including washing, rinsing, drying)?: A Lot Help from another person to put on and taking off regular upper body clothing?: None Help from another person to put on and taking off regular lower body clothing?: A Lot 6 Click Score: 18    End of Session Equipment Utilized  During Treatment: Gait belt;Rolling walker  OT Visit Diagnosis: Other abnormalities of gait and mobility (R26.89)   Activity Tolerance Patient tolerated treatment well   Patient Left Other (comment);with family/visitor present(seated eob)   Nurse Communication          Time: 4008-6761 OT Time Calculation (min): 18 min  Charges: OT General Charges $OT Visit: 1 Visit OT Treatments $Self Care/Home Management : 8-22 mins  Janice Coffin, COTA/L 12/10/2017, 8:11 AM

## 2017-12-12 DIAGNOSIS — I5032 Chronic diastolic (congestive) heart failure: Secondary | ICD-10-CM | POA: Diagnosis not present

## 2017-12-12 DIAGNOSIS — R1310 Dysphagia, unspecified: Secondary | ICD-10-CM | POA: Diagnosis not present

## 2017-12-12 DIAGNOSIS — M109 Gout, unspecified: Secondary | ICD-10-CM | POA: Diagnosis not present

## 2017-12-12 DIAGNOSIS — J449 Chronic obstructive pulmonary disease, unspecified: Secondary | ICD-10-CM | POA: Diagnosis not present

## 2017-12-12 DIAGNOSIS — D631 Anemia in chronic kidney disease: Secondary | ICD-10-CM | POA: Diagnosis not present

## 2017-12-12 DIAGNOSIS — E785 Hyperlipidemia, unspecified: Secondary | ICD-10-CM | POA: Diagnosis not present

## 2017-12-12 DIAGNOSIS — E1122 Type 2 diabetes mellitus with diabetic chronic kidney disease: Secondary | ICD-10-CM | POA: Diagnosis not present

## 2017-12-12 DIAGNOSIS — G4733 Obstructive sleep apnea (adult) (pediatric): Secondary | ICD-10-CM | POA: Diagnosis not present

## 2017-12-12 DIAGNOSIS — N183 Chronic kidney disease, stage 3 (moderate): Secondary | ICD-10-CM | POA: Diagnosis not present

## 2017-12-12 DIAGNOSIS — Z471 Aftercare following joint replacement surgery: Secondary | ICD-10-CM | POA: Diagnosis not present

## 2017-12-12 DIAGNOSIS — F329 Major depressive disorder, single episode, unspecified: Secondary | ICD-10-CM | POA: Diagnosis not present

## 2017-12-12 DIAGNOSIS — I130 Hypertensive heart and chronic kidney disease with heart failure and stage 1 through stage 4 chronic kidney disease, or unspecified chronic kidney disease: Secondary | ICD-10-CM | POA: Diagnosis not present

## 2017-12-12 DIAGNOSIS — D860 Sarcoidosis of lung: Secondary | ICD-10-CM | POA: Diagnosis not present

## 2017-12-14 ENCOUNTER — Other Ambulatory Visit: Payer: Self-pay

## 2017-12-14 DIAGNOSIS — F329 Major depressive disorder, single episode, unspecified: Secondary | ICD-10-CM | POA: Diagnosis not present

## 2017-12-14 DIAGNOSIS — I130 Hypertensive heart and chronic kidney disease with heart failure and stage 1 through stage 4 chronic kidney disease, or unspecified chronic kidney disease: Secondary | ICD-10-CM | POA: Diagnosis not present

## 2017-12-14 DIAGNOSIS — I5032 Chronic diastolic (congestive) heart failure: Secondary | ICD-10-CM | POA: Diagnosis not present

## 2017-12-14 DIAGNOSIS — E785 Hyperlipidemia, unspecified: Secondary | ICD-10-CM | POA: Diagnosis not present

## 2017-12-14 DIAGNOSIS — R1310 Dysphagia, unspecified: Secondary | ICD-10-CM | POA: Diagnosis not present

## 2017-12-14 DIAGNOSIS — G4733 Obstructive sleep apnea (adult) (pediatric): Secondary | ICD-10-CM | POA: Diagnosis not present

## 2017-12-14 DIAGNOSIS — M109 Gout, unspecified: Secondary | ICD-10-CM | POA: Diagnosis not present

## 2017-12-14 DIAGNOSIS — D860 Sarcoidosis of lung: Secondary | ICD-10-CM | POA: Diagnosis not present

## 2017-12-14 DIAGNOSIS — J449 Chronic obstructive pulmonary disease, unspecified: Secondary | ICD-10-CM | POA: Diagnosis not present

## 2017-12-14 DIAGNOSIS — Z471 Aftercare following joint replacement surgery: Secondary | ICD-10-CM | POA: Diagnosis not present

## 2017-12-14 DIAGNOSIS — N183 Chronic kidney disease, stage 3 (moderate): Secondary | ICD-10-CM | POA: Diagnosis not present

## 2017-12-14 DIAGNOSIS — E1122 Type 2 diabetes mellitus with diabetic chronic kidney disease: Secondary | ICD-10-CM | POA: Diagnosis not present

## 2017-12-14 DIAGNOSIS — D631 Anemia in chronic kidney disease: Secondary | ICD-10-CM | POA: Diagnosis not present

## 2017-12-14 MED ORDER — VENLAFAXINE HCL ER 75 MG PO CP24: 75.0000 mg | ORAL_CAPSULE | Freq: Two times a day (BID) | ORAL | Status: DC

## 2017-12-15 ENCOUNTER — Telehealth (INDEPENDENT_AMBULATORY_CARE_PROVIDER_SITE_OTHER): Payer: Self-pay | Admitting: Radiology

## 2017-12-15 NOTE — Telephone Encounter (Signed)
Patient lmovm stating she has had loose stools 3-4 times daily since she came home from surgery a week ago.  She has tried OTC meds without help.  She is not sure if it has something to do with the meds or not.  Please call to advise.

## 2017-12-16 ENCOUNTER — Ambulatory Visit: Payer: BLUE CROSS/BLUE SHIELD | Admitting: Cardiology

## 2017-12-16 DIAGNOSIS — F329 Major depressive disorder, single episode, unspecified: Secondary | ICD-10-CM | POA: Diagnosis not present

## 2017-12-16 DIAGNOSIS — J449 Chronic obstructive pulmonary disease, unspecified: Secondary | ICD-10-CM | POA: Diagnosis not present

## 2017-12-16 DIAGNOSIS — I5032 Chronic diastolic (congestive) heart failure: Secondary | ICD-10-CM | POA: Diagnosis not present

## 2017-12-16 DIAGNOSIS — R1310 Dysphagia, unspecified: Secondary | ICD-10-CM | POA: Diagnosis not present

## 2017-12-16 DIAGNOSIS — E785 Hyperlipidemia, unspecified: Secondary | ICD-10-CM | POA: Diagnosis not present

## 2017-12-16 DIAGNOSIS — E1122 Type 2 diabetes mellitus with diabetic chronic kidney disease: Secondary | ICD-10-CM | POA: Diagnosis not present

## 2017-12-16 DIAGNOSIS — G4733 Obstructive sleep apnea (adult) (pediatric): Secondary | ICD-10-CM | POA: Diagnosis not present

## 2017-12-16 DIAGNOSIS — M109 Gout, unspecified: Secondary | ICD-10-CM | POA: Diagnosis not present

## 2017-12-16 DIAGNOSIS — N183 Chronic kidney disease, stage 3 (moderate): Secondary | ICD-10-CM | POA: Diagnosis not present

## 2017-12-16 DIAGNOSIS — Z471 Aftercare following joint replacement surgery: Secondary | ICD-10-CM | POA: Diagnosis not present

## 2017-12-16 DIAGNOSIS — D860 Sarcoidosis of lung: Secondary | ICD-10-CM | POA: Diagnosis not present

## 2017-12-16 DIAGNOSIS — I130 Hypertensive heart and chronic kidney disease with heart failure and stage 1 through stage 4 chronic kidney disease, or unspecified chronic kidney disease: Secondary | ICD-10-CM | POA: Diagnosis not present

## 2017-12-16 DIAGNOSIS — D631 Anemia in chronic kidney disease: Secondary | ICD-10-CM | POA: Diagnosis not present

## 2017-12-16 NOTE — Telephone Encounter (Signed)
I called pt and she states that she still has diarrhea and that she has taken OTC and no help. I advised the pt that she should call her PCP and discuss that the opposite would happen from medication ( narcotics) being binding. But she is having the opposite reaction and that she should follow up with PCP and make sure she does not become dehydrated.

## 2017-12-19 ENCOUNTER — Ambulatory Visit (INDEPENDENT_AMBULATORY_CARE_PROVIDER_SITE_OTHER): Payer: BLUE CROSS/BLUE SHIELD | Admitting: Family Medicine

## 2017-12-19 ENCOUNTER — Encounter: Payer: Self-pay | Admitting: Family Medicine

## 2017-12-19 VITALS — BP 126/64 | HR 79 | Temp 98.3°F | Ht 66.0 in | Wt 232.8 lb

## 2017-12-19 DIAGNOSIS — I10 Essential (primary) hypertension: Secondary | ICD-10-CM | POA: Diagnosis not present

## 2017-12-19 DIAGNOSIS — R197 Diarrhea, unspecified: Secondary | ICD-10-CM | POA: Diagnosis not present

## 2017-12-19 MED ORDER — METRONIDAZOLE 500 MG PO TABS: 500.0000 mg | ORAL_TABLET | Freq: Three times a day (TID) | ORAL | 0 refills | Status: DC

## 2017-12-19 NOTE — Progress Notes (Signed)
    Subjective:  [name] is a 57 y.o. female who presents today for same-day appointment with a chief complaint of diarrhea.   HPI:  Diarrhea, Acute issue Symptoms started 2 weeks ago.  Patient was recently in the hospital for knee replacement surgery.  She was given perioperative antibiotics, but has not been on any antibiotics since being discharged from the hospital.  She has not had any other medication changes.  Stool is described as soft and watery.  Also very malodorous.  She has been having up to 4 bowel movements per day.  Symptoms are stable over the last several days.  Occasionally has lower abdominal cramps prior to bowel movements.  No current abdominal pain.  No fevers or chills.  No nausea or vomiting, but has had some decreased appetite.  She has been trying to stay well-hydrated.  She has tried over-the-counter Imodium which is not helped.  No melena or hematochezia.  No obvious alleviating or aggravating factors.   ROS: Per HPI  PMH: She reports that  has never smoked. she has never used smokeless tobacco. She reports that she does not drink alcohol or use drugs.  Objective:  Physical Exam: BP 126/64 (BP Location: Right Arm, Patient Position: Sitting, Cuff Size: Normal)   Pulse 79   Temp 98.3 F (36.8 C) (Oral)   Ht 5\' 6"  (1.676 m)   Wt 232 lb 12.8 oz (105.6 kg)   SpO2 99%   BMI 37.57 kg/m   Wt Readings from Last 3 Encounters:  12/19/17 232 lb 12.8 oz (105.6 kg)  12/07/17 247 lb (112 kg)  12/06/17 247 lb 9.6 oz (112.3 kg)  Gen: Ill-appearing, but nontoxic 57 year old woman in no acute distress. CV: RRR with no murmurs appreciated Pulm: NWOB, CTAB with no crackles, wheezes, or rhonchi GI: Normal bowel sounds present.  Soft, nondistended.  Mild tenderness to lower abdomen.  No rebound or guarding.  Assessment/Plan:  Diarrhea Patient is down about 15 pounds over the last couple weeks.  Given her recent hospitalization, she has had a high risk for C.  difficile infection.  We will empirically start Flagyl 500 mg 3 times daily for 10-day course.  We will also check a C. difficile and stool pathogen panel.  She appears ill, but is otherwise clinically stable.  No signs of dehydration.  Encouraged good oral hydration over the next several days to weeks.  Discussed return precautions including worsening diarrhea, and development of fevers, chills, nausea/vomiting, or severe abdominal pain.  She will follow-up with me or her PCP in 1-2 weeks, or sooner if symptoms do not improve with treatment.  Hypertension Patient at goal today.  Given her degree of weight loss and ongoing diarrhea, I advised patient to stop her telmisartan until her diarrhea resolves, especially in light of her CKD stage III.[name]  She will follow-up with me or her PCP in 1-2 weeks.  Consider restarting telmisartan at that time depending on hydration status.  Algis Greenhouse. Jerline Pain, MD 12/19/2017 10:25 AM

## 2017-12-19 NOTE — Addendum Note (Signed)
Addended by: Frutoso Chase A on: 12/19/2017 11:59 AM   Modules accepted: Orders

## 2017-12-19 NOTE — Patient Instructions (Signed)
Please start the flagyl.  Please stay well hydrated.  We will check stool studies.  Let us know if your symptoms worsen or are not improving in the next few days.  Take care, Dr Jerline Pain

## 2017-12-20 DIAGNOSIS — J449 Chronic obstructive pulmonary disease, unspecified: Secondary | ICD-10-CM | POA: Diagnosis not present

## 2017-12-20 DIAGNOSIS — D860 Sarcoidosis of lung: Secondary | ICD-10-CM | POA: Diagnosis not present

## 2017-12-20 DIAGNOSIS — E1122 Type 2 diabetes mellitus with diabetic chronic kidney disease: Secondary | ICD-10-CM | POA: Diagnosis not present

## 2017-12-20 DIAGNOSIS — E785 Hyperlipidemia, unspecified: Secondary | ICD-10-CM | POA: Diagnosis not present

## 2017-12-20 DIAGNOSIS — I5032 Chronic diastolic (congestive) heart failure: Secondary | ICD-10-CM | POA: Diagnosis not present

## 2017-12-20 DIAGNOSIS — D631 Anemia in chronic kidney disease: Secondary | ICD-10-CM | POA: Diagnosis not present

## 2017-12-20 DIAGNOSIS — G4733 Obstructive sleep apnea (adult) (pediatric): Secondary | ICD-10-CM | POA: Diagnosis not present

## 2017-12-20 DIAGNOSIS — I130 Hypertensive heart and chronic kidney disease with heart failure and stage 1 through stage 4 chronic kidney disease, or unspecified chronic kidney disease: Secondary | ICD-10-CM | POA: Diagnosis not present

## 2017-12-20 DIAGNOSIS — M109 Gout, unspecified: Secondary | ICD-10-CM | POA: Diagnosis not present

## 2017-12-20 DIAGNOSIS — F329 Major depressive disorder, single episode, unspecified: Secondary | ICD-10-CM | POA: Diagnosis not present

## 2017-12-20 DIAGNOSIS — Z471 Aftercare following joint replacement surgery: Secondary | ICD-10-CM | POA: Diagnosis not present

## 2017-12-20 DIAGNOSIS — R1310 Dysphagia, unspecified: Secondary | ICD-10-CM | POA: Diagnosis not present

## 2017-12-20 DIAGNOSIS — N183 Chronic kidney disease, stage 3 (moderate): Secondary | ICD-10-CM | POA: Diagnosis not present

## 2017-12-21 ENCOUNTER — Ambulatory Visit (INDEPENDENT_AMBULATORY_CARE_PROVIDER_SITE_OTHER): Payer: BLUE CROSS/BLUE SHIELD | Admitting: Orthopedic Surgery

## 2017-12-21 ENCOUNTER — Ambulatory Visit (INDEPENDENT_AMBULATORY_CARE_PROVIDER_SITE_OTHER): Payer: BLUE CROSS/BLUE SHIELD

## 2017-12-21 ENCOUNTER — Telehealth: Payer: Self-pay | Admitting: Family Medicine

## 2017-12-21 ENCOUNTER — Encounter (INDEPENDENT_AMBULATORY_CARE_PROVIDER_SITE_OTHER): Payer: Self-pay | Admitting: Orthopedic Surgery

## 2017-12-21 VITALS — Ht 66.0 in | Wt 232.0 lb

## 2017-12-21 DIAGNOSIS — G8929 Other chronic pain: Secondary | ICD-10-CM

## 2017-12-21 DIAGNOSIS — M25562 Pain in left knee: Secondary | ICD-10-CM

## 2017-12-21 NOTE — Telephone Encounter (Signed)
Called and spoke to patient who states she did start the antibiotic that Dr. Jerline Pain prescribed. She verbalized understanding that her lab results aren't back yet. She states the worsening abdominal pain is new. Causes patient to double over after she eats. She states once she goes to the bathroom that it gets better. Sharp pain started yesterday. Denies fever. States diarrhea has remained constant 3-4 times a day. She is ok to be scheduled to come back in. She states she might have a UTI also. She states urine is dark, burns when she urinates, and has noticed frequency. I am scheduling her to come in and be seen.

## 2017-12-21 NOTE — Telephone Encounter (Signed)
See note.   Copied from [place] [id number]. Topic: Quick Communication - Lab Results >> Dec 21, 2017 11:43 AM Oliver Pila B wrote: Reason for CRM: pt called to get lab results, and pt states she is having pain in her abdomin while having diarrhea, contact pt to advise  >> Dec 21, 2017 12:59 PM Carole Binning B wrote: Possible Triage?

## 2017-12-21 NOTE — Progress Notes (Signed)
X ray

## 2017-12-21 NOTE — Telephone Encounter (Signed)
Patient is scheduled for tomorrow morning with Inda Coke

## 2017-12-21 NOTE — Progress Notes (Signed)
Office Visit Note   Patient: [name]           Date of Birth: 05/07/1961           MRN: [id number] Visit Date: 12/21/2017              Requested by: [name], MD [place], [place] [address] PCP: [name], MD  Chief Complaint  Patient presents with  . Left Knee - Routine Post Op    12/07/17 Left TKA      HPI: Patient presents 2 weeks status post left total knee arthroplasty.  She is progressing well she states she is completed her home health therapy.  Assessment & Plan: Visit Diagnoses:  1. Chronic pain of left knee     Plan: The staples are harvested she will begin scar massage continue work on knee extension exercises.  She was given a prescription for [facility] therapy outpatient.  Follow-Up Instructions: No Follow-up on file.   Ortho Exam  Patient is alert, oriented, no adenopathy, well-dressed, normal affect, normal respiratory effort. Examination the incision is well-healed we will harvest the staples.  She lacks about 5 degrees to full extension she will continue work on the extension her patella is midline.  Imaging: No results found. No images are attached to the encounter.  Labs: Lab Results  Component Value Date   HGBA1C 8.4 (H) 11/29/2017   HGBA1C 8.7 (H) 09/20/2017   HGBA1C 7.8 (H) 08/13/2017   ESRSEDRATE 28 06/16/2017   ESRSEDRATE 5 01/26/2017   ESRSEDRATE 27 10/14/2016   CRP 27.1 (H) 06/16/2017   CRP 17.3 (H) 01/26/2017   CRP 24.0 (H) 10/14/2016   LABURIC 5.1 01/26/2017   REPTSTATUS 09/22/2017 FINAL 09/20/2017   GRAMSTAIN  08/14/2017    MODERATE WBC PRESENT, PREDOMINANTLY PMN MODERATE BUDDING YEAST SEEN    CULT NO GROUP A STREP (S.PYOGENES) ISOLATED 09/20/2017   LABORGA PSEUDOMONAS FLUORESCENS 08/14/2017    @LABSALLVALUES (HGBA1)@  Body mass index is 37.45 kg/m.  Orders:  Orders Placed This Encounter  Procedures  . XR Knee 1-2 Views Left   No orders of the defined types were placed in  this encounter.    Procedures: No procedures performed  Clinical Data: No additional findings.  ROS:  All other systems negative, except as noted in the HPI. Review of Systems  Objective: Vital Signs: Ht 5\' 6"  (1.676 m)   Wt 232 lb (105.2 kg)   BMI 37.45 kg/m   Specialty Comments:  No specialty comments available.  PMFS History: Patient Active Problem List   Diagnosis Date Noted  . S/P total knee arthroplasty 12/07/2017  . Unilateral primary osteoarthritis, left knee 11/07/2017  . Dysphagia 09/20/2017  . Epiglottitis   . Plantar fasciitis, right 07/13/2017  . S/P total knee arthroplasty, right 06/29/2017  . Osteoarthrosis, localized, primary, knee, right   . Sprain of calcaneofibular ligament of right ankle 06/06/2017  . Osteoarthritis of right knee 01/26/2017  . Multiple allergies 10/14/2016  . Onychomycosis 10/27/2015  . Osteomyelitis of left foot ([place]) 05/29/2015  . CKD (chronic kidney disease) stage 3, GFR 30-59 ml/min (HCC) 04/27/2015  . MRSA (methicillin resistant staph aureus) culture positive 03/27/2015  . Wound infection complicating hardware ([place]) 03/27/2015  . Fatty liver 09/30/2014  . Hot flashes 07/15/2014  . Abnormal SPEP 04/17/2014  . Fracture of left leg 04/17/2014  . Cushingoid side effect of steroids ([place]) 04/17/2014  . Internal hemorrhoids   . Depression   . Preoperative clearance  03/25/2014  . Obstructive chronic bronchitis without exacerbation ([place]) 09/18/2013  . Chest pain 04/11/2013  . Hypertensive heart disease with chronic diastolic congestive heart failure ([place])   . Solitary pulmonary nodule, on CT 02/2013 - stable over 2 years in 2015 02/20/2013  . Gout 08/20/2010  . Anemia 09/18/2009  . Morbid obesity ([place]) 06/04/2009  . Sleep apnea 04/21/2009  . Sarcoidosis of lung ([place]) 04/10/2007  . Hyperlipidemia 08/21/2006  . Essential hypertension 08/21/2006  . GERD 08/21/2006  . Type II diabetes mellitus with renal manifestations ([place])  08/21/2006   Past Medical History:  Diagnosis Date  . Abnormal SPEP 04/17/2014  . Acute left ankle pain 01/26/2017  . ANEMIA-UNSPECIFIED 09/18/2009  . Anxiety   . Arthritis   . CHF (congestive heart failure) ([place])   . Chronic diastolic heart failure, NYHA class 2 (HCC)    LVEDP roughly 20% by cath  . COPD (chronic obstructive pulmonary disease) ([place])   . Depression   . DIABETES MELLITUS, TYPE II 08/21/2006  . Diabetic osteomyelitis ([place]) 05/29/2015  . Fracture of 5th metatarsal    non union  . GERD 08/21/2006  . GOUT 08/20/2010  . Hx of umbilical hernia repair   . HYPERLIPIDEMIA 08/21/2006  . HYPERTENSION 08/21/2006  . Infection of wound due to methicillin resistant Staphylococcus aureus (MRSA)   . Internal hemorrhoids   . Multiple allergies 10/14/2016  . OBESITY 06/04/2009  . Onychomycosis 10/27/2015  . Osteomyelitis of left foot ([place]) 05/29/2015  . Pulmonary sarcoidosis ([place])    Followed locally by pulmonology, but also by Dr. Casper Harrison at [facility]  . Right knee pain 01/26/2017  . Vocal cord dysfunction   . Wears partial dentures     Family History  Problem Relation Age of Onset  . Diabetes Father   . Heart attack Father 29  . Coronary artery disease Father   . Heart failure Father   . COPD Mother   . Emphysema Mother   . Asthma Mother   . Heart failure Mother   . Breast cancer Mother   . Heart attack Maternal Grandfather   . Sarcoidosis Maternal Uncle   . Lung cancer Brother   . Diabetes Brother   . Colon cancer Neg Hx     Past Surgical History:  Procedure Laterality Date  . ABDOMINAL HYSTERECTOMY    . APPENDECTOMY    . BLADDER SUSPENSION  11/11/2011   Procedure: TRANSVAGINAL TAPE (TVT) PROCEDURE;  Surgeon: Olga Millers, MD;  Location: [facility];  Service: Gynecology;  Laterality: N/A;  . CARDIAC CATHETERIZATION  07/2010   LVEF 50-55% WITH VERY MILD GLOBAL HYPOKINESIA; ESSENTIALLY NORMAL CORONARY ARTERIES; NORMAL LV FUNCTION  . CAROTIDS  02/18/11    CAROTID DUPLEX; VERTEBRALS ARE PATENT WITH ANTEGRADE FLOW. ICA/CCA RATIO 1.61 ON RIGHT AND 0.75 ON LEFT  . CHOLECYSTECTOMY  1984  . CYSTOSCOPY  11/11/2011   Procedure: CYSTOSCOPY;  Surgeon: Olga Millers, MD;  Location: [facility];  Service: Gynecology;  Laterality: N/A;  . DOPPLER ECHOCARDIOGRAPHY  02/12/2013   LV FUNCTION, SIZE NORMAL; MILD CONCENTRIC LVH; EST EF 55-65%; WALL MOTION NORMAL  . EXTUBATION (ENDOTRACHEAL) IN OR N/A 09/23/2017   Procedure: EXTUBATION (ENDOTRACHEAL) IN OR;  Surgeon: Helayne Seminole, MD;  Location: [facility];  Service: ENT;  Laterality: N/A;  . FIBEROPTIC LARYNGOSCOPY AND TRACHEOSCOPY N/A 09/23/2017   Procedure: FLEXIBLE FIBEROPTIC LARYNGOSCOPY;  Surgeon: Helayne Seminole, MD;  Location: [facility];  Service: ENT;  Laterality: N/A;  . FRACTURE SURGERY  foot  . HERNIA REPAIR    . I&D EXTREMITY Left 06/27/2015   Procedure: Partial Excision Left Calcaneus, Place Antibiotic Beads, and Wound VAC;  Surgeon: Newt Minion, MD;  Location: [facility];  Service: Orthopedics;  Laterality: Left;  . KNEE ARTHROSCOPY     right  . LEFT AND RIGHT HEART CATHETERIZATION WITH CORONARY ANGIOGRAM N/A 04/23/2013   Procedure: LEFT AND RIGHT HEART CATHETERIZATION WITH CORONARY ANGIOGRAM;  Surgeon: Leonie Man, MD;  [facility] C[facility];  Service: Cardiovascular;  Laterality: N/A;  . LEFT HEART CATH AND CORONARY ANGIOGRAPHY N/A 03/11/2017   Procedure: Left Heart Cath and Coronary Angiography;  Surgeon: Sherren Mocha, MD;  Location: [facility] CV [facility];  Service: Cardiovascular;  Laterality: N/A;  . LexiScan Myoview  03/09/2013   EF 50%; NORMAL MYOCARDIAL PERFUSION STUDY - breast attenuation  . METATARSAL OSTEOTOMY WITH OPEN REDUCTION INTERNAL FIXATION (ORIF) METATARSAL WITH FUSION Left 04/09/2014   Procedure: LEFT FOOT FRACTURE OPEN TREATMENT METATARSAL INCLUDES INTERNAL FIXATION EACH;  Surgeon: Lorn Junes, MD;  Location: [facility];  Service: Orthopedics;  Laterality: Left;    . NISSEN FUNDOPLICATION  9735  . Right and left CARDIAC CATHETERIZATION  04/23/2013   Angiographic normal coronaries; LVEDP 20 mmHg, PCWP 12-14 mmHg, RAP 12 mmHg.; Fick CO/CI 4.9/2.2  . TOTAL KNEE ARTHROPLASTY Right 06/29/2017   Procedure: RIGHT TOTAL KNEE ARTHROPLASTY;  Surgeon: Newt Minion, MD;  Location: [facility];  Service: Orthopedics;  Laterality: Right;  . TOTAL KNEE ARTHROPLASTY Left 12/07/2017  . TOTAL KNEE ARTHROPLASTY Left 12/07/2017   Procedure: LEFT TOTAL KNEE ARTHROPLASTY;  Surgeon: Newt Minion, MD;  Location: [facility];  Service: Orthopedics;  Laterality: Left;  . TRACHEOSTOMY TUBE PLACEMENT N/A 09/20/2017   Procedure: AWAKE INTUBATION WITH ANESTHESIA WITH VIDEO ASSISTANCE;  Surgeon: Helayne Seminole, MD;  Location: [facility];  Service: ENT;  Laterality: N/A;  . TUBAL LIGATION     with reversal in 1994  . VENTRAL HERNIA REPAIR     Social History   Occupational History  . Occupation: DISABLED  Tobacco Use  . Smoking status: Never Smoker  . Smokeless tobacco: Never Used  Substance and Sexual Activity  . Alcohol use: No    Alcohol/week: 0.0 oz  . Drug use: No  . Sexual activity: Yes    Birth control/protection: Surgical

## 2017-12-22 ENCOUNTER — Encounter: Payer: Self-pay | Admitting: Physician Assistant

## 2017-12-22 ENCOUNTER — Ambulatory Visit (INDEPENDENT_AMBULATORY_CARE_PROVIDER_SITE_OTHER): Payer: BLUE CROSS/BLUE SHIELD | Admitting: Physician Assistant

## 2017-12-22 VITALS — BP 146/78 | HR 76 | Temp 98.1°F | Ht 66.0 in | Wt 233.4 lb

## 2017-12-22 DIAGNOSIS — R300 Dysuria: Secondary | ICD-10-CM

## 2017-12-22 DIAGNOSIS — E1129 Type 2 diabetes mellitus with other diabetic kidney complication: Secondary | ICD-10-CM

## 2017-12-22 DIAGNOSIS — R109 Unspecified abdominal pain: Secondary | ICD-10-CM

## 2017-12-22 DIAGNOSIS — Z794 Long term (current) use of insulin: Secondary | ICD-10-CM | POA: Diagnosis not present

## 2017-12-22 LAB — POCT URINALYSIS DIPSTICK
Bilirubin, UA: NEGATIVE
Blood, UA: NEGATIVE
Glucose, UA: NEGATIVE
Ketones, UA: NEGATIVE
Leukocytes, UA: NEGATIVE
Nitrite, UA: POSITIVE
Protein, UA: NEGATIVE
Spec Grav, UA: 1.02 (ref 1.010–1.025)
Urobilinogen, UA: 0.2 E.U./dL
pH, UA: 5.5 (ref 5.0–8.0)

## 2017-12-22 LAB — CBC WITH DIFFERENTIAL/PLATELET
Basophils Absolute: 0.1 10*3/uL (ref 0.0–0.1)
Basophils Relative: 1.3 % (ref 0.0–3.0)
Eosinophils Absolute: 0.2 10*3/uL (ref 0.0–0.7)
Eosinophils Relative: 2.1 % (ref 0.0–5.0)
HCT: 31.9 % — ABNORMAL LOW (ref 36.0–46.0)
Hemoglobin: 10.4 g/dL — ABNORMAL LOW (ref 12.0–15.0)
Lymphocytes Relative: 17.1 % (ref 12.0–46.0)
Lymphs Abs: 2 10*3/uL (ref 0.7–4.0)
MCHC: 32.6 g/dL (ref 30.0–36.0)
MCV: 83.1 fl (ref 78.0–100.0)
Monocytes Absolute: 0.9 10*3/uL (ref 0.1–1.0)
Monocytes Relative: 7.8 % (ref 3.0–12.0)
Neutro Abs: 8.4 10*3/uL — ABNORMAL HIGH (ref 1.4–7.7)
Neutrophils Relative %: 71.7 % (ref 43.0–77.0)
Platelets: 599 10*3/uL — ABNORMAL HIGH (ref 150.0–400.0)
RBC: 3.84 Mil/uL — ABNORMAL LOW (ref 3.87–5.11)
RDW: 14.8 % (ref 11.5–15.5)
WBC: 11.8 10*3/uL — ABNORMAL HIGH (ref 4.0–10.5)

## 2017-12-22 LAB — C. DIFFICILE GDH AND TOXIN A/B
GDH ANTIGEN: NOT DETECTED
MICRO NUMBER:: 90307119
SPECIMEN QUALITY:: ADEQUATE
TOXIN A AND B: NOT DETECTED

## 2017-12-22 LAB — GASTROINTESTINAL PATHOGEN PANEL PCR
C. difficile Tox A/B, PCR: NOT DETECTED
Campylobacter, PCR: NOT DETECTED
Cryptosporidium, PCR: NOT DETECTED
E coli (ETEC) LT/ST PCR: NOT DETECTED
E coli (STEC) stx1/stx2, PCR: NOT DETECTED
E coli 0157, PCR: NOT DETECTED
Giardia lamblia, PCR: NOT DETECTED
Norovirus, PCR: NOT DETECTED
Rotavirus A, PCR: NOT DETECTED
Salmonella, PCR: NOT DETECTED
Shigella, PCR: NOT DETECTED

## 2017-12-22 LAB — COMPREHENSIVE METABOLIC PANEL
ALT: 12 U/L (ref 0–35)
AST: 10 U/L (ref 0–37)
Albumin: 4 g/dL (ref 3.5–5.2)
Alkaline Phosphatase: 124 U/L — ABNORMAL HIGH (ref 39–117)
BUN: 17 mg/dL (ref 6–23)
CO2: 29 mEq/L (ref 19–32)
Calcium: 9.7 mg/dL (ref 8.4–10.5)
Chloride: 101 mEq/L (ref 96–112)
Creatinine, Ser: 0.94 mg/dL (ref 0.40–1.20)
GFR: 64.92 mL/min (ref >60.00)
Glucose, Bld: 94 mg/dL (ref 70–99)
Potassium: 4.3 mEq/L (ref 3.5–5.1)
Sodium: 140 mEq/L (ref 135–145)
Total Bilirubin: 0.5 mg/dL (ref 0.2–1.2)
Total Protein: 6.6 g/dL (ref 6.0–8.3)

## 2017-12-22 MED ORDER — CIPROFLOXACIN HCL 250 MG PO TABS: 250.0000 mg | ORAL_TABLET | Freq: Two times a day (BID) | ORAL | 0 refills | Status: AC

## 2017-12-22 NOTE — Patient Instructions (Signed)
It was great to see you!  Continue the Flagyl, take as directed.  Make sure you check your blood sugars.  Start on Cipro and take as prescribed.  We will call you with your lab results.  If your pain worsens in any way, go to the emergency room!   Bland Diet A bland diet consists of foods that do not have a lot of fat or fiber. Foods without fat or fiber are easier for the body to digest. They are also less likely to irritate your mouth, throat, stomach, and other parts of your gastrointestinal tract. A bland diet is sometimes called a BRAT diet. What is my plan? Your health care provider or dietitian may recommend specific changes to your diet to prevent and treat your symptoms, such as:  Eating small meals often.  Cooking food until it is soft enough to chew easily.  Chewing your food well.  Drinking fluids slowly.  Not eating foods that are very spicy, sour, or fatty.  Not eating citrus fruits, such as oranges and grapefruit.  What do I need to know about this diet?  Eat a variety of foods from the bland diet food list.  Do not follow a bland diet longer than you have to.  Ask your health care provider whether you should take vitamins. What foods can I eat? Grains  Hot cereals, such as cream of wheat. Bread, crackers, or tortillas made from refined white flour. Rice. Vegetables Canned or cooked vegetables. Mashed or boiled potatoes. Fruits Bananas. Applesauce. Other types of cooked or canned fruit with the skin and seeds removed, such as canned peaches or pears. Meats and Other Protein Sources Scrambled eggs. Creamy peanut butter or other nut butters. Lean, well-cooked meats, such as chicken or fish. Tofu. Soups or broths. Dairy Low-fat dairy products, such as milk, cottage cheese, or yogurt. Beverages Water. Herbal tea. Apple juice. Sweets and Desserts Pudding. Custard. Fruit gelatin. Ice cream. Fats and Oils Mild salad dressings. Canola or olive oil. The  items listed above may not be a complete list of allowed foods or beverages. Contact your dietitian for more options. What foods are not recommended? Foods and ingredients that are often not recommended include:  Spicy foods, such as hot sauce or salsa.  Fried foods.  Sour foods, such as pickled or fermented foods.  Raw vegetables or fruits, especially citrus or berries.  Caffeinated drinks.  Alcohol.  Strongly flavored seasonings or condiments.  The items listed above may not be a complete list of foods and beverages that are not allowed. Contact your dietitian for more information. This information is not intended to replace advice given to you by your health care provider. Make sure you discuss any questions you have with your health care provider. Document Released: 01/19/2016 Document Revised: 03/04/2016 Document Reviewed: 10/09/2014 Elsevier Interactive Patient Education  2018 [employer].

## 2017-12-22 NOTE — Progress Notes (Signed)
[name] is a 57 y.o. female here for a new problem.  I acted as a Education administrator for [employer], PA-C Anselmo Pickler, LPN  History of Present Illness:   Chief Complaint  Patient presents with  . Abdominal Pain    2wks 8 pain scale. cramping sensations; Lower abdomen   . Diarrhea    2wks-Seen Dr. Jerline Pain Monday and gave a sample    HPI   Patient was seen by Dr. Dimas Chyle on 12/19/17 for diarrhea. At that visit she complained of diarrhea x 2 weeks s/p knee replacement surgery and multiple rounds of antibiotics. Stool tests were performed -- C diff was negative, however the GI pathogen panel is still pending. She was empirically started on Flagyl 500 mg TID.  Cramping at its worst is an 8/10. She endorses cramping with eating and after eating (within 30 min.) Slightly relieved with using the bathroom. Still taking the Flagyl. Eating: crackers, bacon and eggs, toast, biscuit. Describes that her diarrhea is "green and very smelly." No fever. Hasn't been checking her blood sugars. Drinking [employer], water, per husband. She is passing gas.  Denies fevers, chills, SOB, chest pain. She does report that she has lost about 15lb since her surgery.  Wt Readings from Last 15 Encounters:  12/22/17 233 lb 6.4 oz (105.9 kg)  12/21/17 232 lb (105.2 kg)  12/19/17 232 lb 12.8 oz (105.6 kg)  12/07/17 247 lb (112 kg)  12/06/17 247 lb 9.6 oz (112.3 kg)  11/30/17 248 lb 8 oz (112.7 kg)  11/30/17 248 lb 6.4 oz (112.7 kg)  11/29/17 247 lb 1.6 oz (112.1 kg)  11/23/17 246 lb 3.2 oz (111.7 kg)  10/25/17 246 lb (111.6 kg)  10/17/17 246 lb 3.2 oz (111.7 kg)  10/03/17 241 lb 6.4 oz (109.5 kg)  09/26/17 243 lb 2.7 oz (110.3 kg)  08/29/17 251 lb 3.2 oz (113.9 kg)  08/22/17 248 lb 3.2 oz (112.6 kg)     Past Medical History:  Diagnosis Date  . Abnormal SPEP 04/17/2014  . Acute left ankle pain 01/26/2017  . ANEMIA-UNSPECIFIED 09/18/2009  . Anxiety   . Arthritis   . CHF (congestive  heart failure) ([place])   . Chronic diastolic heart failure, NYHA class 2 (HCC)    LVEDP roughly 20% by cath  . COPD (chronic obstructive pulmonary disease) ([place])   . Depression   . DIABETES MELLITUS, TYPE II 08/21/2006  . Diabetic osteomyelitis ([place]) 05/29/2015  . Fracture of 5th metatarsal    non union  . GERD 08/21/2006  . GOUT 08/20/2010  . Hx of umbilical hernia repair   . HYPERLIPIDEMIA 08/21/2006  . HYPERTENSION 08/21/2006  . Infection of wound due to methicillin resistant Staphylococcus aureus (MRSA)   . Internal hemorrhoids   . Multiple allergies 10/14/2016  . OBESITY 06/04/2009  . Onychomycosis 10/27/2015  . Osteomyelitis of left foot ([place]) 05/29/2015  . Pulmonary sarcoidosis ([place])    Followed locally by pulmonology, but also by Dr. Casper Harrison at [facility]  . Right knee pain 01/26/2017  . Vocal cord dysfunction   . Wears partial dentures      Social History   Socioeconomic History  . Marital status: Married    Spouse name: SHERESE HEYWARD  . Number of children: 2  . Years of education: 72  . Highest education level: Not on file  Social Needs  . Financial resource strain: Not on file  . Food insecurity - worry: Not on file  .  Food insecurity - inability: Not on file  . Transportation needs - medical: Not on file  . Transportation needs - non-medical: Not on file  Occupational History  . Occupation: DISABLED  Tobacco Use  . Smoking status: Never Smoker  . Smokeless tobacco: Never Used  Substance and Sexual Activity  . Alcohol use: No    Alcohol/week: 0.0 oz  . Drug use: No  . Sexual activity: Yes    Birth control/protection: Surgical  Other Topics Concern  . Not on file  Social History Narrative   Married 1994. 2 sons who both live close and 1 grandson.       Disability due to sarcoidosis. 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 ABDOMINAL HYSTERECTOMY    . APPENDECTOMY    . BLADDER SUSPENSION  11/11/2011   Procedure: TRANSVAGINAL TAPE (TVT) PROCEDURE;  Surgeon: Olga Millers, MD;  Location: [facility];  Service: Gynecology;  Laterality: N/A;  . CARDIAC CATHETERIZATION  07/2010   LVEF 50-55% WITH VERY MILD GLOBAL HYPOKINESIA; ESSENTIALLY NORMAL CORONARY ARTERIES; NORMAL LV FUNCTION  . CAROTIDS  02/18/11   CAROTID DUPLEX; VERTEBRALS ARE PATENT WITH ANTEGRADE FLOW. ICA/CCA RATIO 1.61 ON RIGHT AND 0.75 ON LEFT  . CHOLECYSTECTOMY  1984  . CYSTOSCOPY  11/11/2011   Procedure: CYSTOSCOPY;  Surgeon: Olga Millers, MD;  Location: [facility];  Service: Gynecology;  Laterality: N/A;  . DOPPLER ECHOCARDIOGRAPHY  02/12/2013   LV FUNCTION, SIZE NORMAL; MILD CONCENTRIC LVH; EST EF 55-65%; WALL MOTION NORMAL  . EXTUBATION (ENDOTRACHEAL) IN OR N/A 09/23/2017   Procedure: EXTUBATION (ENDOTRACHEAL) IN OR;  Surgeon: Helayne Seminole, MD;  Location: [place];  Service: ENT;  Laterality: N/A;  . FIBEROPTIC LARYNGOSCOPY AND TRACHEOSCOPY N/A 09/23/2017   Procedure: FLEXIBLE FIBEROPTIC LARYNGOSCOPY;  Surgeon: Helayne Seminole, MD;  Location: [place];  Service: ENT;  Laterality: N/A;  . FRACTURE SURGERY     foot  . HERNIA REPAIR    . I&D EXTREMITY Left 06/27/2015   Procedure: Partial Excision Left Calcaneus, Place Antibiotic Beads, and Wound VAC;  Surgeon: Newt Minion, MD;  Location: [place];  Service: Orthopedics;  Laterality: Left;  . KNEE ARTHROSCOPY     right  . LEFT AND RIGHT HEART CATHETERIZATION WITH CORONARY ANGIOGRAM N/A 04/23/2013   Procedure: LEFT AND RIGHT HEART CATHETERIZATION WITH CORONARY ANGIOGRAM;  Surgeon: Leonie Man, MD;  Location: [facility];  Service: Cardiovascular;  Laterality: N/A;  . LEFT HEART CATH AND CORONARY ANGIOGRAPHY N/A 03/11/2017   Procedure: Left Heart Cath and Coronary Angiography;  Surgeon: Sherren Mocha, MD;  Location: [place] [facility];  Service: Cardiovascular;  Laterality: N/A;  . LexiScan Myoview  03/09/2013   EF 50%; NORMAL MYOCARDIAL PERFUSION STUDY - breast attenuation  . METATARSAL OSTEOTOMY WITH OPEN REDUCTION INTERNAL FIXATION (ORIF) METATARSAL WITH FUSION Left 04/09/2014   Procedure: LEFT FOOT FRACTURE OPEN TREATMENT METATARSAL INCLUDES INTERNAL FIXATION EACH;  Surgeon: Lorn Junes, MD;  Location: [place];  Service: Orthopedics;  Laterality: Left;  . NISSEN FUNDOPLICATION  4431  . Right and left CARDIAC CATHETERIZATION  04/23/2013   Angiographic normal coronaries; LVEDP 20 mmHg, PCWP 12-14 mmHg, RAP 12 mmHg.; Fick CO/CI 4.9/2.2  . TOTAL KNEE ARTHROPLASTY Right 06/29/2017   Procedure: RIGHT TOTAL  KNEE ARTHROPLASTY;  Surgeon: Newt Minion, MD;  Location: [place];  Service: Orthopedics;  Laterality: Right;  . TOTAL KNEE ARTHROPLASTY Left 12/07/2017  . TOTAL KNEE ARTHROPLASTY Left 12/07/2017   Procedure: LEFT TOTAL KNEE ARTHROPLASTY;  Surgeon: Newt Minion, MD;  Location: [place];  Service: Orthopedics;  Laterality: Left;  . TRACHEOSTOMY TUBE PLACEMENT N/A 09/20/2017   Procedure: AWAKE INTUBATION WITH ANESTHESIA WITH VIDEO ASSISTANCE;  Surgeon: Helayne Seminole, MD;  Location: [place];  Service: ENT;  Laterality: N/A;  . TUBAL LIGATION     with reversal in 1994  . VENTRAL HERNIA REPAIR      Family History  Problem Relation Age of Onset  . Diabetes Father   . Heart attack Father 61  . Coronary artery disease Father   . Heart failure Father   . COPD Mother   . Emphysema Mother   . Asthma Mother   . Heart failure Mother   . Breast cancer Mother   . Heart attack Maternal Grandfather   . Sarcoidosis Maternal Uncle   . Lung cancer Brother   . Diabetes Brother   . Colon cancer Neg Hx     Allergies  Allergen Reactions  . Methotrexate Other (See Comments)    Peri-oral and buccal lesions  . Vancomycin Other (See Comments)    DOSE RELATED NEPHROTOXICITY  . Lisinopril Cough   . Chlorhexidine Itching  . Clindamycin/Lincomycin Nausea And Vomiting and Rash    RASH > ALLERGY INTOLERANCE > NAUSEA & VOMITING  . Doxycycline Rash  . Teflaro [Ceftaroline] Rash    Tolerates ceftriaxone     Current Medications:   Current Outpatient Medications:  .  albuterol (PROVENTIL HFA;VENTOLIN HFA) 108 (90 Base) MCG/ACT inhaler, Inhale 1-2 puffs into the lungs every 6 (six) hours as needed for wheezing or shortness of breath., Disp: 8 g, Rfl: 5 .  allopurinol (ZYLOPRIM) 100 MG tablet, Take 100 mg by mouth 2 (two) times daily. , Disp: , Rfl:  .  aspirin EC 81 MG tablet, Take 81 mg by mouth daily. , Disp: , Rfl:  .  atorvastatin (LIPITOR) 40 MG tablet, TAKE 1 TAB BY MOUTH ONCE DAILY (Patient taking differently: Take 40 mg by mouth once a day), Disp: 90 tablet, Rfl: 3 .  BAYER MICROLET LANCETS lancets, Use as instructed to check sugar 3 times daily, Disp: 300 each, Rfl: 5 .  benzonatate (TESSALON) 200 MG capsule, Take 200 mg by mouth 3 (three) times daily as needed for cough. , Disp: , Rfl:  .  Blood Glucose Monitoring Suppl (BAYER CONTOUR MONITOR) w/Device KIT, Use daily to check sugar, Disp: 1 kit, Rfl: 0 .  buPROPion (WELLBUTRIN XL) 300 MG 24 hr tablet, Take 1 tablet (300 mg total) by mouth daily., Disp: 90 tablet, Rfl: 3 .  Calcium Citrate 200 MG TABS, Take 200 mg by mouth daily., Disp: , Rfl:  .  carvedilol (COREG) 25 MG tablet, Take 1 tablet (25 mg total) by mouth 2 (two) times daily with a meal., Disp: 180 tablet, Rfl: 3 .  Cholecalciferol (VITAMIN D) 2000 units tablet, Take 2,000 Units by mouth daily., Disp: , Rfl:  .  Cyanocobalamin (VITAMIN B-12 PO), Take 2,500 mcg by mouth daily. , Disp: , Rfl:  .  dexlansoprazole (DEXILANT) 60 MG capsule, Take 1 capsule (60 mg total) by mouth daily., Disp: 30 capsule, Rfl: 1 .  diclofenac sodium (VOLTAREN) 1 % GEL, APPLY 2 GRAMS TOPICALLY 4 TIMES A DAY (Patient taking differently: APPLY 2  GRAMS TOPICALLY 4 TIMES A DAY AS NEEDED FOR KNEE  PAIN), Disp: 100 g, Rfl: 0 .  diltiazem (CARDIZEM CD) 120 MG 24 hr capsule, Take 1 capsule (120 mg total) by mouth daily., Disp: 90 capsule, Rfl: 3 .  fenofibrate (TRICOR) 145 MG tablet, Take 1 tablet (145 mg total) by mouth daily. NEED OV., Disp: 30 tablet, Rfl: 0 .  fluticasone (FLONASE) 50 MCG/ACT nasal spray, Place 2 sprays into both nostrils daily. (Patient taking differently: Place 2 sprays into both nostrils daily as needed for allergies. ), Disp: 48 g, Rfl: 3 .  fluticasone furoate-vilanterol (BREO ELLIPTA) 200-25 MCG/INH AEPB, Inhale 1 puff into the lungs daily., Disp: , Rfl:  .  furosemide (LASIX) 40 MG tablet, Take 1 tablet (40 mg total) by mouth 2 (two) times daily., Disp: 180 tablet, Rfl: 1 .  gabapentin (NEURONTIN) 100 MG capsule, TAKE 1 CAPSULE BY MOUTH THREE TIMES A DAY (Patient taking differently: TAKE 1 CAPSULE BY MOUTH TWO TIMES A DAY), Disp: 90 capsule, Rfl: 0 .  glucose blood (BAYER CONTOUR TEST) test strip, Use as instructed to check sugar 3 times daily, Disp: 300 each, Rfl: 5 .  HYDROcodone-acetaminophen (NORCO/VICODIN) 5-325 MG tablet, Take 1 tablet by mouth every 4 (four) hours as needed for moderate pain., Disp: 60 tablet, Rfl: 0 .  insulin aspart (NOVOLOG FLEXPEN) 100 UNIT/ML FlexPen, Inject 30-35 Units into the skin 3 (three) times daily with meals., Disp: 30 mL, Rfl: 5 .  Insulin Glargine (TOUJEO SOLOSTAR) 300 UNIT/ML SOPN, Inject 40 Units into the skin at bedtime., Disp: 5 pen, Rfl: 11 .  Insulin Pen Needle 33G X 4 MM MISC, 1 each by Does not apply route 4 (four) times daily., Disp: 200 each, Rfl: 11 .  LORazepam (ATIVAN) 0.5 MG tablet, Take 0.5 mg by mouth every 12 (twelve) hours as needed for anxiety., Disp: , Rfl:  .  metFORMIN (GLUCOPHAGE) 1000 MG tablet, Take 1 tablet (1,000 mg total) by mouth 2 (two) times daily with a meal., Disp: 180 tablet, Rfl: 0 .  metroNIDAZOLE (FLAGYL) 500 MG tablet, Take 1 tablet (500 mg total) by mouth 3 (three) times daily., Disp: 30  tablet, Rfl: 0 .  nitroGLYCERIN (NITROSTAT) 0.4 MG SL tablet, Place 1 tablet (0.4 mg total) under the tongue every 5 (five) minutes as needed for chest pain. Reported on 01/05/2016, Disp: 100 tablet, Rfl: 0 .  ondansetron (ZOFRAN-ODT) 4 MG disintegrating tablet, Take 4 mg by mouth every 8 (eight) hours as needed for nausea or vomiting. , Disp: , Rfl:  .  PARoxetine Mesylate (BRISDELLE) 7.5 MG CAPS, Take 7.5 mg by mouth daily., Disp: , Rfl:  .  Potassium Chloride ER 20 MEQ TBCR, Take 1 tablet by mouth 3 (three) times daily. (Patient taking differently: Take 1.5 tablets by mouth 2 (two) times daily. ), Disp: 90 tablet, Rfl: 3 .  predniSONE (DELTASONE) 5 MG tablet, Take 5 mg by mouth daily with breakfast., Disp: , Rfl:  .  triamcinolone cream (KENALOG) 0.1 %, Apply 1 application topically daily as needed (for irritation). , Disp: , Rfl:  .  venlafaxine XR (EFFEXOR-XR) 75 MG 24 hr capsule, Take 1 capsule (75 mg total) by mouth 2 (two) times daily., Disp: , Rfl:  .  ciprofloxacin (CIPRO) 250 MG tablet, Take 1 tablet (250 mg total) by mouth 2 (two) times daily for 7 days., Disp: 14 tablet, Rfl: 0 .  telmisartan (MICARDIS) 20 MG tablet, Take 1 tablet (20 mg total) by mouth daily. (Patient  not taking: Reported on 12/22/2017), Disp: 30 tablet, Rfl: 1   Review of Systems:   ROS  Negative unless otherwise specified per HPI.  Vitals:   Vitals:   12/22/17 0850  BP: (!) 146/78  Pulse: 76  Temp: 98.1 F (36.7 C)  TempSrc: Oral  SpO2: 97%  Weight: 233 lb 6.4 oz (105.9 kg)  Height: '5\' 6"'$  (1.676 m)     Body mass index is 37.67 kg/m.  Physical Exam:   Physical Exam  Constitutional: She appears well-developed. She is cooperative.  Non-toxic appearance. She does not have a sickly appearance. She does not appear ill. No distress.  Cardiovascular: Normal rate, regular rhythm, S1 normal, S2 normal, normal heart sounds and normal pulses.  No LE edema  Pulmonary/Chest: Effort normal and breath sounds  normal.  Abdominal: Normal appearance and bowel sounds are normal. There is generalized tenderness. There is no rigidity, no rebound, no guarding and no CVA tenderness.  Neurological: She is alert. GCS eye subscore is 4. GCS verbal subscore is 5. GCS motor subscore is 6.  Skin: Skin is warm, dry and intact.  Psychiatric: She has a normal mood and affect. Her speech is normal and behavior is normal.  Nursing note and vitals reviewed.   Results for orders placed or performed in visit on 12/22/17  POCT urinalysis dipstick  Result Value Ref Range   Color, UA Yellow    Clarity, UA Clear    Glucose, UA Negative    Bilirubin, UA Negative    Ketones, UA Negative    Spec Grav, UA 1.020 1.010 - 1.025   Blood, UA Negative    pH, UA 5.5 5.0 - 8.0   Protein, UA Negative    Urobilinogen, UA 0.2 0.2 or 1.0 E.U./dL   Nitrite, UA Positive    Leukocytes, UA Negative Negative   Appearance     Odor     *Note: Due to a large number of results and/or encounters for the requested time period, some results have not been displayed. A complete set of results can be found in Results Review.    Assessment and Plan:    Judie was seen today for abdominal pain and diarrhea.  Diagnoses and all orders for this visit:  Abdominal pain, unspecified abdominal location & DM No red flags on exam. Continue Flagyl. Will start oral cipro. Discussed need to check blood sugars regularly, as she has lost about 15 lb since her surgery and we may need to make sure her blood sugar is well controlled. Focus on bland foods -- avoid bacon, biscuits and high fat foods that she is currently eating. Avoid sugary beverages. Will check CBC and CMP today. GI pathology panel still pending. Maintain hydration. If any change or worsening of abdominal pain patient was instructed to go to the ER. Patient and husband verbalized understanding. -     POCT urinalysis dipstick -     Comprehensive metabolic panel -     CBC with  Differential/Platelet  Dysuria UA with nitrites. Will treat with cipro. Send off urine culture. Follow-up if symptoms worsen or persist. -     Comprehensive metabolic panel -     CBC with Differential/Platelet -     Urine Culture  Other orders -     ciprofloxacin (CIPRO) 250 MG tablet; Take 1 tablet (250 mg total) by mouth 2 (two) times daily for 7 days.    . Reviewed expectations re: course of current medical issues. . Discussed self-management of symptoms. [name]  Outlined signs and symptoms indicating need for more acute intervention. . Patient verbalized understanding and all questions were answered. . See orders for this visit as documented in the electronic medical record. . Patient received an After-Visit Summary.  CMA or LPN served as scribe during this visit. History, Physical, and Plan performed by medical provider. Documentation and orders reviewed and attested to.  Inda Coke, PA-C

## 2017-12-23 DIAGNOSIS — D631 Anemia in chronic kidney disease: Secondary | ICD-10-CM | POA: Diagnosis not present

## 2017-12-23 DIAGNOSIS — E1122 Type 2 diabetes mellitus with diabetic chronic kidney disease: Secondary | ICD-10-CM | POA: Diagnosis not present

## 2017-12-23 DIAGNOSIS — Z471 Aftercare following joint replacement surgery: Secondary | ICD-10-CM | POA: Diagnosis not present

## 2017-12-23 DIAGNOSIS — N183 Chronic kidney disease, stage 3 (moderate): Secondary | ICD-10-CM | POA: Diagnosis not present

## 2017-12-23 DIAGNOSIS — D860 Sarcoidosis of lung: Secondary | ICD-10-CM | POA: Diagnosis not present

## 2017-12-23 DIAGNOSIS — E785 Hyperlipidemia, unspecified: Secondary | ICD-10-CM | POA: Diagnosis not present

## 2017-12-23 DIAGNOSIS — R1310 Dysphagia, unspecified: Secondary | ICD-10-CM | POA: Diagnosis not present

## 2017-12-23 DIAGNOSIS — J449 Chronic obstructive pulmonary disease, unspecified: Secondary | ICD-10-CM | POA: Diagnosis not present

## 2017-12-23 DIAGNOSIS — I130 Hypertensive heart and chronic kidney disease with heart failure and stage 1 through stage 4 chronic kidney disease, or unspecified chronic kidney disease: Secondary | ICD-10-CM | POA: Diagnosis not present

## 2017-12-23 DIAGNOSIS — M109 Gout, unspecified: Secondary | ICD-10-CM | POA: Diagnosis not present

## 2017-12-23 DIAGNOSIS — G4733 Obstructive sleep apnea (adult) (pediatric): Secondary | ICD-10-CM | POA: Diagnosis not present

## 2017-12-23 DIAGNOSIS — F329 Major depressive disorder, single episode, unspecified: Secondary | ICD-10-CM | POA: Diagnosis not present

## 2017-12-23 DIAGNOSIS — I5032 Chronic diastolic (congestive) heart failure: Secondary | ICD-10-CM | POA: Diagnosis not present

## 2017-12-23 NOTE — Progress Notes (Signed)
Dr Marigene Ehlers interpretation of your lab work:  Your stool sample was NEGATIVE. You can safely stop your antibiotics. If your symptoms are not improving, please come back to see me or Dr Yong Channel.   If you have any additional questions, please give [place] a call or send [place] a message through [place].  Take care, Dr Jerline Pain

## 2017-12-25 LAB — URINE CULTURE
MICRO NUMBER:: 90325622
SPECIMEN QUALITY:: ADEQUATE

## 2017-12-26 NOTE — Telephone Encounter (Signed)
Pt called b/c she is still waiting to hear the results from her labs from last week, contact pt to advise

## 2017-12-26 NOTE — Telephone Encounter (Signed)
See note

## 2017-12-27 ENCOUNTER — Other Ambulatory Visit: Payer: Self-pay | Admitting: Cardiology

## 2017-12-27 ENCOUNTER — Other Ambulatory Visit: Payer: Self-pay | Admitting: Endocrinology

## 2017-12-27 ENCOUNTER — Ambulatory Visit: Payer: Self-pay | Admitting: *Deleted

## 2017-12-27 NOTE — Telephone Encounter (Signed)
Pt's husband called for lab results. When asked how his wife was doing, he states that she is still having diarrhea.  Pt was called and she states that she is having diarrhea pretty much after she eats anything. She is eating a bland diet and drinking Gatorade and water.  She has terrible cramping and then it stops after she has a bm. She denies having fever, dizziness and n/v, no blood in stool. Pt wanted to know if her pcp had any suggestions for her. She has taken the antibiotic and she wants to know if she might need an xray.  She did not want an appointment, but would like a call back . No protocol noted for her symptoms.  Will contact flow at  [facility].  Answer Assessment - Initial Assessment Questions 1. DIARRHEA SEVERITY: "How bad is the diarrhea?" "How many extra stools have you had in the past 24 hours than normal?"    - MILD: Few loose or mushy BMs; increase of 1-3 stools over normal daily number of stools; mild increase in ostomy output.   - MODERATE: Increase of 4-6 stools daily over normal; moderate increase in ostomy output.   - SEVERE (or Worst Possible): Increase of 7 or more stools daily over normal; moderate increase in ostomy output; incontinence.     Moderate, every time she eats it goes right through 2. ONSET: "When did the diarrhea begin?"      2 weeks ago 3. BM CONSISTENCY: "How loose or watery is the diarrhea?"      watery 4. VOMITING: "Are you also vomiting?" If so, ask: "How many times in the past 24 hours?"      no 5. ABDOMINAL PAIN: "Are you having any abdominal pain?" If yes: "What does it feel like?" (e.g., crampy, dull, intermittent, constant)      Cramping after eating and also diarrhea  6. ABDOMINAL PAIN SEVERITY: If present, ask: "How bad is the pain?"  (e.g., Scale 1-10; mild, moderate, or severe)    - MILD (1-3): doesn't interfere with normal activities, abdomen soft and not tender to touch     - MODERATE (4-7): interferes with normal activities or  awakens from sleep, tender to touch     - SEVERE (8-10): excruciating pain, doubled over, unable to do any normal activities       Moderated #7 7. ORAL INTAKE: If vomiting, "Have you been able to drink liquids?" "How much fluids have you had in the past 24 hours?"     Gatorade, and water 8. HYDRATION: "Any signs of dehydration?" (e.g., dry mouth [not just dry lips], too weak to stand, dizziness, new weight loss) "When did you last urinate?"     Dry mouth and dry lips, 15 lbs in one weight when last went to the doctor 9. EXPOSURE: "Have you traveled to a foreign country recently?" "Have you been exposed to anyone with diarrhea?" "Could you have eaten any food that was spoiled?"     No 10. OTHER SYMPTOMS: "Do you have any other symptoms?" (e.g., fever, blood in stool)       no 11. PREGNANCY: "Is there any chance you are pregnant?" "When was your last menstrual period?"       No, had hysterectomy  Protocols used: DIARRHEA-A-AH

## 2017-12-27 NOTE — Telephone Encounter (Signed)
See note

## 2017-12-27 NOTE — Telephone Encounter (Signed)
Patient was spoken to after this triage note was entered.  She was given lab results, and due to the severity of her pain, was advised to go to the ED.  She verbalized understanding.

## 2017-12-28 ENCOUNTER — Other Ambulatory Visit: Payer: Self-pay

## 2017-12-28 MED ORDER — BAYER MICROLET LANCETS MISC: 5 refills | Status: DC

## 2017-12-28 MED ORDER — GLUCOSE BLOOD VI STRP: ORAL_STRIP | 5 refills | Status: DC

## 2017-12-29 ENCOUNTER — Telehealth (INDEPENDENT_AMBULATORY_CARE_PROVIDER_SITE_OTHER): Payer: Self-pay | Admitting: Orthopedic Surgery

## 2017-12-29 DIAGNOSIS — M25662 Stiffness of left knee, not elsewhere classified: Secondary | ICD-10-CM | POA: Diagnosis not present

## 2017-12-29 DIAGNOSIS — M25562 Pain in left knee: Secondary | ICD-10-CM | POA: Diagnosis not present

## 2017-12-29 DIAGNOSIS — M6281 Muscle weakness (generalized): Secondary | ICD-10-CM | POA: Diagnosis not present

## 2017-12-29 DIAGNOSIS — M25462 Effusion, left knee: Secondary | ICD-10-CM | POA: Diagnosis not present

## 2017-12-29 NOTE — Telephone Encounter (Signed)
Patient called asking to see when she could start driving again? CB # [phone]

## 2017-12-29 NOTE — Telephone Encounter (Signed)
[name] s/p a left total knee replacement on 12/07/17. Advised to wait until her appt to discuss with Dr. Sharol Given on the 4th. She would have to be off of all pain medication that could impair judgement and be able to safely manuver a car. Pt voiced understanding and will call with questions.

## 2017-12-30 ENCOUNTER — Ambulatory Visit (INDEPENDENT_AMBULATORY_CARE_PROVIDER_SITE_OTHER): Payer: BLUE CROSS/BLUE SHIELD

## 2017-12-30 ENCOUNTER — Encounter: Payer: Self-pay | Admitting: Family Medicine

## 2017-12-30 ENCOUNTER — Ambulatory Visit (INDEPENDENT_AMBULATORY_CARE_PROVIDER_SITE_OTHER): Payer: BLUE CROSS/BLUE SHIELD | Admitting: Family Medicine

## 2017-12-30 VITALS — BP 146/80 | HR 78 | Temp 98.1°F | Ht 66.0 in | Wt 237.0 lb

## 2017-12-30 DIAGNOSIS — R109 Unspecified abdominal pain: Secondary | ICD-10-CM

## 2017-12-30 DIAGNOSIS — R300 Dysuria: Secondary | ICD-10-CM | POA: Diagnosis not present

## 2017-12-30 LAB — POCT URINALYSIS DIPSTICK
Bilirubin, UA: NEGATIVE
Blood, UA: NEGATIVE
Glucose, UA: NEGATIVE
Nitrite, UA: NEGATIVE
Spec Grav, UA: 1.015 (ref 1.010–1.025)
Urobilinogen, UA: 1 E.U./dL
pH, UA: 6 (ref 5.0–8.0)

## 2017-12-30 MED ORDER — SULFAMETHOXAZOLE-TRIMETHOPRIM 800-160 MG PO TABS: 1.0000 | ORAL_TABLET | Freq: Two times a day (BID) | ORAL | 0 refills | Status: DC

## 2017-12-30 MED ORDER — COLESTIPOL HCL 1 G PO TABS: 2.0000 g | ORAL_TABLET | Freq: Two times a day (BID) | ORAL | 5 refills | Status: DC

## 2017-12-30 NOTE — Progress Notes (Signed)
Subjective:  [name] is a 57 y.o. female who presents today with a chief complaint of diarrhea.   HPI:  Diarrhea, established problem, worsening Patient seen here twice over the past 2 weeks for this.  Overall she has had diarrhea for the past 3-4 weeks.  She has had workup including a negative stool sample.  She was treated with Flagyl which did not help.  She was seen about 8 days ago for dysuria and started on ciprofloxacin.  This did not help with her diarrhea either.  Overall, her diarrhea may be worsening a little bit.  Patient states that she has had a couple times where she has been unable to get to the bathroom in time.  She has a little bit of lower abdominal cramping.  She has been having up to 4 bowel movements per day stool is soft and watery. No melena or hematochezia.  No fevers or chills.  No weight loss.  Dysuria, established problem, not improving As noted above, patient was seen about 8 days ago for this.  She was diagnosed with UTI and started on a course of Cipro.  Symptoms have not improved since her last visit.  ROS: Per HPI  PMH: She reports that she has never smoked. She has never used smokeless tobacco. She reports that she does not drink alcohol or use drugs.  Objective:  Physical Exam: BP (!) 146/80 (BP Location: Left Arm)   Pulse 78   Temp 98.1 F (36.7 C)   Ht 5\' 6"  (1.676 m)   Wt 237 lb (107.5 kg)   SpO2 100%   BMI 38.25 kg/m   Gen: NAD, resting comfortably CV: RRR with no murmurs appreciated Pulm: NWOB, CTAB with no crackles, wheezes, or rhonchi GI: Normal bowel sounds present. Soft, Nontender, Nondistended. MSK: No edema, cyanosis, or clubbing noted Skin: Warm, dry Neuro: Grossly normal, moves all extremities Psych: Normal affect and thought content  Results for orders placed or performed in visit on 12/30/17 (from the past 24 hour(s))  POCT urinalysis dipstick     Status: Abnormal   Collection Time: 12/30/17 11:08 AM  Result Value  Ref Range   Color, UA Dark Yellow    Clarity, UA Clear    Glucose, UA Negative    Bilirubin, UA Negative    Ketones, UA Trace    Spec Grav, UA 1.015 1.010 - 1.025   Blood, UA Negative    pH, UA 6.0 5.0 - 8.0   Protein, UA Trace    Urobilinogen, UA 1.0 0.2 or 1.0 E.U./dL   Nitrite, UA Negative    Leukocytes, UA Small (1+) (A) Negative   Appearance     Odor     *Note: Due to a large number of results and/or encounters for the requested time period, some results have not been displayed. A complete set of results can be found in Results Review.   Assessment/Plan:  Diarrhea Patient's KUB showed some formed stool throughout colon.  Is possible she has underlying constipation is contributing to her diarrhea.  Patient also has a history of previous cholecystectomy and is thus predisposed to diarrhea.  Discussed diagnostic possibilities with patient.  We will start colestipol to hopefully help treat any role that her previous cholecystectomy would be playing.  If her symptoms persist despite this, she will need a full bowel cleanout to rule out underlying constipation as the cause of her symptoms.  If symptoms still persist despite this, will need GI evaluation.  Dysuria UA consistent with UTI.  Given that she is still having symptoms, we will start another course of Bactrim.  Given lack of improvement of symptoms on a course of Cipro, will start 7-day course of Bactrim.  We will also check urine culture.  Algis Greenhouse. Jerline Pain, MD 12/30/2017 12:38 PM

## 2017-12-30 NOTE — Patient Instructions (Addendum)
Start the bactrim.  Start the colestid for your diarrhea. Please also start a fiber supplement and a probiotic.  If your symptoms are not improving in 5-7 days, let me or Dr Yong Channel know. We may need to do a bowel clean-out.  Take care, Dr Jerline Pain

## 2017-12-31 ENCOUNTER — Other Ambulatory Visit: Payer: Self-pay | Admitting: Cardiology

## 2017-12-31 LAB — URINE CULTURE
MICRO NUMBER:: 90363235
SPECIMEN QUALITY:: ADEQUATE

## 2018-01-02 ENCOUNTER — Ambulatory Visit: Payer: BLUE CROSS/BLUE SHIELD | Admitting: Family Medicine

## 2018-01-03 ENCOUNTER — Other Ambulatory Visit: Payer: Self-pay | Admitting: Pulmonary Disease

## 2018-01-03 DIAGNOSIS — M25662 Stiffness of left knee, not elsewhere classified: Secondary | ICD-10-CM | POA: Diagnosis not present

## 2018-01-03 DIAGNOSIS — M25562 Pain in left knee: Secondary | ICD-10-CM | POA: Diagnosis not present

## 2018-01-03 DIAGNOSIS — M6281 Muscle weakness (generalized): Secondary | ICD-10-CM | POA: Diagnosis not present

## 2018-01-03 DIAGNOSIS — M25462 Effusion, left knee: Secondary | ICD-10-CM | POA: Diagnosis not present

## 2018-01-06 DIAGNOSIS — M25462 Effusion, left knee: Secondary | ICD-10-CM | POA: Diagnosis not present

## 2018-01-06 DIAGNOSIS — M25662 Stiffness of left knee, not elsewhere classified: Secondary | ICD-10-CM | POA: Diagnosis not present

## 2018-01-06 DIAGNOSIS — M25562 Pain in left knee: Secondary | ICD-10-CM | POA: Diagnosis not present

## 2018-01-06 DIAGNOSIS — M6281 Muscle weakness (generalized): Secondary | ICD-10-CM | POA: Diagnosis not present

## 2018-01-09 DIAGNOSIS — M25462 Effusion, left knee: Secondary | ICD-10-CM | POA: Diagnosis not present

## 2018-01-09 DIAGNOSIS — M25562 Pain in left knee: Secondary | ICD-10-CM | POA: Diagnosis not present

## 2018-01-09 DIAGNOSIS — M6281 Muscle weakness (generalized): Secondary | ICD-10-CM | POA: Diagnosis not present

## 2018-01-09 DIAGNOSIS — M25662 Stiffness of left knee, not elsewhere classified: Secondary | ICD-10-CM | POA: Diagnosis not present

## 2018-01-10 ENCOUNTER — Ambulatory Visit (INDEPENDENT_AMBULATORY_CARE_PROVIDER_SITE_OTHER): Payer: BLUE CROSS/BLUE SHIELD | Admitting: Cardiology

## 2018-01-10 ENCOUNTER — Encounter: Payer: Self-pay | Admitting: Cardiology

## 2018-01-10 ENCOUNTER — Other Ambulatory Visit: Payer: Self-pay | Admitting: Endocrinology

## 2018-01-10 VITALS — BP 130/68 | HR 80 | Ht 66.0 in | Wt 242.0 lb

## 2018-01-10 DIAGNOSIS — R072 Precordial pain: Secondary | ICD-10-CM | POA: Diagnosis not present

## 2018-01-10 DIAGNOSIS — I110 Hypertensive heart disease with heart failure: Secondary | ICD-10-CM

## 2018-01-10 DIAGNOSIS — E782 Mixed hyperlipidemia: Secondary | ICD-10-CM

## 2018-01-10 DIAGNOSIS — I5032 Chronic diastolic (congestive) heart failure: Secondary | ICD-10-CM | POA: Diagnosis not present

## 2018-01-10 DIAGNOSIS — I10 Essential (primary) hypertension: Secondary | ICD-10-CM

## 2018-01-10 MED ORDER — NITROGLYCERIN 0.4 MG SL SUBL: 0.4000 mg | SUBLINGUAL_TABLET | SUBLINGUAL | 6 refills | Status: DC | PRN

## 2018-01-10 NOTE — Patient Instructions (Signed)
NO CHANGE  CURRENT  MEDICATIONS     Your physician wants you to follow-up in [place]. You will receive a reminder letter in the mail two months in advance. If you don't receive a letter, please call our office to schedule the follow-up appointment.   If you need a refill on your cardiac medications before your next appointment, please call your pharmacy.

## 2018-01-10 NOTE — Progress Notes (Signed)
PCP: [name], MD  Pulmonolgy: Dr. Nori Riis [facility]) ; [place] - Dr. Concepcion Living  Clinic Note: Chief Complaint  Patient presents with  . Follow-up    12 months   . Shortness of Breath    At baseline  . Chest Pain    Intermittent chest twinges    HPI: [name] is a 57 y.o. female with a PMH below who presents today for 1 year follow-up for her mild diastolic heart failure/hypertensive heart disease with pulmonary sarcoidosis.  --> She has a history of gastric bypass surgery as well as hypertension, hyperlipidemia, diabetes mellitus,-type II and COPD.  -- She was initially seen back in 2014 and had a right left heart cath. Angiographic normal coronary arteries with essentially normal right heart pressures.. She had previously had a negative cath in 2011 as well.  [name] was last seen in February 2018, stable from a cardiac standpoint.  No heart failure or angina symptoms.  Intermittent bouts of bronchitis and sarcoid issues.  But nothing from a cardiac standpoint.  Recent Hospitalizations:   Mar 10, 2017: Admitted with left upper chest pressure that was thought to be consistent with anginal symptoms. -->  Myoview ordered.-->  Myoview read as abnormal with apical lateral defect.  EF 55%.  (Intermediate risk)-->  Referred for cardiac catheterization with minimal disease in the LAD only.  Otherwise angiographically normal with normal EDP.  FALSE POSITIVE NUCLEAR STRESS TEST.  December 2018- ICU admission with acute epiglotitis - (fiberoptic) intubated  & IV ABx.  Actually she had tracheostomy.  Both knees arthroplasty  Studies Reviewed:   Lexiscan Myoview 03/10/2017: Moderate size "stress-induced "perfusion defect at the apex as well as "ill-defined stress-induced perfusion defect in the lateral wall.  EF 55%.  INTERMEDIATE risk. -->  FALSE POSITIVE  Cardiac Cath March 11, 2017: Angiographically minimal CAD with normal LVEDP.  Interval History: [name] returns today  overall doing well from a cardiac standpoint. I was not aware of the fact that she had just had a heart catheterization this past June for chest pain admission.  She denies having any further chest pain since that time.  She has had several hospitalizations including bilateral knee surgeries as well as a pretty significant admission in December 2018 with what sounds like may be Ludwig's angina leading to acute throat closure requiring fiberoptic intubation and OG tube feeding.  She is treated with high-dose antibiotics and is now finally recovered.  She subsequently underwent her second knee surgery. She still has occasional Twinges in her chest that have been intermittent off and on for the last several years or not exertional in nature.  She pretty much is in good spirits and feeling well at her baseline now.  She cannot recall the last time she had a sarcoidosis flare but she still has her stable chronic exertional dyspnea.  She denies any heart failure symptoms of PND orthopnea but does have some mild edema.  No further chest tightness or pressure with rest or exertion. She denies any rapid irregular heartbeats or palpitations.  No syncope/near syncope or TIA/amaurosis fugax.  No claudication.  ROS: A comprehensive was performed. Review of Systems  Constitutional: Negative for chills, fever and malaise/fatigue (Feeling better, has not had a bout of bronchitis in a while.).  HENT: Negative for congestion and nosebleeds.   Respiratory: Positive for cough (Off and on) and shortness of breath (Baseline). Negative for sputum production.   Cardiovascular:       Per history  of present illness  Gastrointestinal: Negative for abdominal pain, blood in stool and melena.  Genitourinary: Negative for hematuria.  Musculoskeletal: Positive for joint pain (Postop knees are stiff, but not painful).  Skin: Negative for rash.  Neurological: Negative.  Negative for focal weakness.  Psychiatric/Behavioral:  Negative for memory loss. The patient is not nervous/anxious and does not have insomnia.   All other systems reviewed and are negative.   Past Medical History:  Diagnosis Date  . Abnormal SPEP 04/17/2014  . Acute left ankle pain 01/26/2017  . ANEMIA-UNSPECIFIED 09/18/2009  . Anxiety   . Arthritis   . Chronic diastolic heart failure, NYHA class 2 (HCC)    Normal LVEDP by May 2018  . COPD (chronic obstructive pulmonary disease) ([place])   . Depression   . DIABETES MELLITUS, TYPE II 08/21/2006  . Diabetic osteomyelitis ([place]) 05/29/2015  . Fracture of 5th metatarsal    non union  . GERD 08/21/2006  . GOUT 08/20/2010  . Hx of umbilical hernia repair   . HYPERLIPIDEMIA 08/21/2006  . HYPERTENSION 08/21/2006  . Infection of wound due to methicillin resistant Staphylococcus aureus (MRSA)   . Internal hemorrhoids   . Multiple allergies 10/14/2016  . OBESITY 06/04/2009  . Onychomycosis 10/27/2015  . Osteomyelitis of left foot ([place]) 05/29/2015  . Pulmonary sarcoidosis ([place])    Followed locally by pulmonology, but also by Dr. Casper Harrison at [facility]  . Right knee pain 01/26/2017  . Vocal cord dysfunction   . Wears partial dentures     Past Surgical History:  Procedure Laterality Date  . ABDOMINAL HYSTERECTOMY    . APPENDECTOMY    . BLADDER SUSPENSION  11/11/2011   Procedure: TRANSVAGINAL TAPE (TVT) PROCEDURE;  Surgeon: Olga Millers, MD;  Location: [facility];  Service: Gynecology;  Laterality: N/A;  . CAROTIDS  02/18/11   CAROTID DUPLEX; VERTEBRALS ARE PATENT WITH ANTEGRADE FLOW. ICA/CCA RATIO 1.61 ON RIGHT AND 0.75 ON LEFT  . CHOLECYSTECTOMY  1984  . CYSTOSCOPY  11/11/2011   Procedure: CYSTOSCOPY;  Surgeon: Olga Millers, MD;  Location: [facility];  Service: Gynecology;  Laterality: N/A;  . EXTUBATION (ENDOTRACHEAL) IN OR N/A 09/23/2017   Procedure: EXTUBATION (ENDOTRACHEAL) IN OR;  Surgeon: Helayne Seminole, MD;  Location: [place];  Service: ENT;  Laterality: N/A;  . FIBEROPTIC  LARYNGOSCOPY AND TRACHEOSCOPY N/A 09/23/2017   Procedure: FLEXIBLE FIBEROPTIC LARYNGOSCOPY;  Surgeon: Helayne Seminole, MD;  Location: [place];  Service: ENT;  Laterality: N/A;  . FRACTURE SURGERY     foot  . HERNIA REPAIR    . I&D EXTREMITY Left 06/27/2015   Procedure: Partial Excision Left Calcaneus, Place Antibiotic Beads, and Wound VAC;  Surgeon: Newt Minion, MD;  Location: [place];  Service: Orthopedics;  Laterality: Left;  . KNEE ARTHROSCOPY     right  . LEFT AND RIGHT HEART CATHETERIZATION WITH CORONARY ANGIOGRAM N/A 04/23/2013   Procedure: LEFT AND RIGHT HEART CATHETERIZATION WITH CORONARY ANGIOGRAM;  Surgeon: Leonie Man, MD;  Location: [facility] C[facility];  Service: Cardiovascular;  Laterality: N/A;  . LEFT HEART CATH AND CORONARY ANGIOGRAPHY N/A 03/11/2017   Procedure: Left Heart Cath and Coronary Angiography;  Surgeon: Sherren Mocha, MD;  Location: [place] CV LAB; angiographically minimal CAD in the LAD otherwise normal.;  Normal LVEDP.  FALSE POSITIVE MYOVIEW  . LEFT HEART CATH AND CORONARY ANGIOGRAPHY  07/20/2010   LVEF 50-55% WITH VERY MILD GLOBAL HYPOKINESIA; ESSENTIALLY NORMAL CORONARY ARTERIES; NORMAL LV FUNCTION  . METATARSAL OSTEOTOMY  WITH OPEN REDUCTION INTERNAL FIXATION (ORIF) METATARSAL WITH FUSION Left 04/09/2014   Procedure: LEFT FOOT FRACTURE OPEN TREATMENT METATARSAL INCLUDES INTERNAL FIXATION EACH;  Surgeon: Lorn Junes, MD;  Location: [place];  Service: Orthopedics;  Laterality: Left;  . NISSEN FUNDOPLICATION  4627  . NM MYOVIEW LTD  03/09/2013   Lexiscan --> EF 50%; NORMAL MYOCARDIAL PERFUSION STUDY - breast attenuation  . NM MYOVIEW LTD  03/10/2017   : Moderate size "stress-induced "perfusion defect at the apex as well as "ill-defined stress-induced perfusion defect in the lateral wall.  EF 55%.  INTERMEDIATE risk. -->  FALSE POSITIVE  . Right and left CARDIAC CATHETERIZATION  04/23/2013   Angiographic normal coronaries; LVEDP 20 mmHg, PCWP  12-14 mmHg, RAP 12 mmHg.; Fick CO/CI 4.9/2.2  . TOTAL KNEE ARTHROPLASTY Right 06/29/2017   Procedure: RIGHT TOTAL KNEE ARTHROPLASTY;  Surgeon: Newt Minion, MD;  Location: [place];  Service: Orthopedics;  Laterality: Right;  . TOTAL KNEE ARTHROPLASTY Left 12/07/2017  . TOTAL KNEE ARTHROPLASTY Left 12/07/2017   Procedure: LEFT TOTAL KNEE ARTHROPLASTY;  Surgeon: Newt Minion, MD;  Location: [place];  Service: Orthopedics;  Laterality: Left;  . TRACHEOSTOMY TUBE PLACEMENT N/A 09/20/2017   Procedure: AWAKE INTUBATION WITH ANESTHESIA WITH VIDEO ASSISTANCE;  Surgeon: Helayne Seminole, MD;  Location: [place] [facility];  Service: ENT;  Laterality: N/A;  . TRANSTHORACIC ECHOCARDIOGRAM  08/2014   Normal LV size and function.  Mild LVH.  EF 55-60%.  Normal regional wall motion.  GR 1 DD.  Normal RV size and function .  [name] TUBAL LIGATION     with reversal in 1994  . VENTRAL HERNIA REPAIR       Current Meds  Medication Sig  . albuterol (PROVENTIL HFA;VENTOLIN HFA) 108 (90 Base) MCG/ACT inhaler Inhale 1-2 puffs into the lungs every 6 (six) hours as needed for wheezing or shortness of breath.  . allopurinol (ZYLOPRIM) 100 MG tablet Take 100 mg by mouth 2 (two) times daily.   [name] aspirin EC 81 MG tablet Take 81 mg by mouth daily.   [name] atorvastatin (LIPITOR) 40 MG tablet TAKE 1 TAB BY MOUTH ONCE DAILY (Patient taking differently: Take 40 mg by mouth once a day)  . BAYER MICROLET LANCETS lancets Use as instructed to check sugar 3 times daily  . benzonatate (TESSALON) 200 MG capsule Take 200 mg by mouth 3 (three) times daily as needed for cough.   . Blood Glucose Monitoring Suppl (BAYER CONTOUR MONITOR) w/Device KIT Use daily to check sugar  . buPROPion (WELLBUTRIN XL) 300 MG 24 hr tablet Take 1 tablet (300 mg total) by mouth daily.  . Calcium Citrate 200 MG TABS Take 200 mg by mouth daily.  . carvedilol (COREG) 25 MG tablet Take 1 tablet (25 mg total) by mouth 2 (two) times daily with a meal.  . Cholecalciferol (VITAMIN  D) 2000 units tablet Take 2,000 Units by mouth daily.  . colestipol (COLESTID) 1 g tablet Take 2 tablets (2 g total) by mouth 2 (two) times daily.  . Cyanocobalamin (VITAMIN B-12 PO) Take 2,500 mcg by mouth daily.   [name] DEXILANT 60 MG capsule TAKE 1 CAPSULE BY MOUTH EVERY DAY  . diclofenac sodium (VOLTAREN) 1 % GEL APPLY 2 GRAMS TOPICALLY 4 TIMES A DAY (Patient taking differently: APPLY 2 GRAMS TOPICALLY 4 TIMES A DAY AS NEEDED FOR KNEE PAIN)  . diltiazem (CARDIZEM CD) 120 MG 24 hr capsule TAKE ONE CAPSULE BY MOUTH DAILY  . fenofibrate (TRICOR)  145 MG tablet TAKE 1 TABLET (145 MG TOTAL) BY MOUTH DAILY  . fluticasone (FLONASE) 50 MCG/ACT nasal spray Place 2 sprays into both nostrils daily. (Patient taking differently: Place 2 sprays into both nostrils daily as needed for allergies. )  . fluticasone furoate-vilanterol (BREO ELLIPTA) 200-25 MCG/INH AEPB Inhale 1 puff into the lungs daily.  . furosemide (LASIX) 40 MG tablet Take 1 tablet (40 mg total) by mouth 2 (two) times daily.  [name] gabapentin (NEURONTIN) 100 MG capsule TAKE 1 CAPSULE BY MOUTH THREE TIMES A DAY  . glucose blood (BAYER CONTOUR TEST) test strip Use as instructed to check sugar 3 times daily  . HYDROcodone-acetaminophen (NORCO/VICODIN) 5-325 MG tablet Take 1 tablet by mouth every 4 (four) hours as needed for moderate pain.  [name] insulin aspart (NOVOLOG FLEXPEN) 100 UNIT/ML FlexPen Inject 30-35 Units into the skin 3 (three) times daily with meals.  . Insulin Glargine (TOUJEO SOLOSTAR) 300 UNIT/ML SOPN Inject 40 Units into the skin at bedtime.  . Insulin Pen Needle 33G X 4 MM MISC 1 each by Does not apply route 4 (four) times daily.  [name] LORazepam (ATIVAN) 0.5 MG tablet Take 0.5 mg by mouth every 12 (twelve) hours as needed for anxiety.  . nitroGLYCERIN (NITROSTAT) 0.4 MG SL tablet Place 1 tablet (0.4 mg total) under the tongue every 5 (five) minutes as needed for chest pain. Reported on 01/05/2016  . ondansetron (ZOFRAN-ODT) 4 MG disintegrating  tablet Take 4 mg by mouth every 8 (eight) hours as needed for nausea or vomiting.   [name] PARoxetine Mesylate (BRISDELLE) 7.5 MG CAPS Take 7.5 mg by mouth daily.  . Potassium Chloride ER 20 MEQ TBCR Take 1 tablet by mouth 3 (three) times daily. (Patient taking differently: Take 1.5 tablets by mouth 2 (two) times daily. )  . predniSONE (DELTASONE) 5 MG tablet Take 5 mg by mouth daily with breakfast.  . sulfamethoxazole-trimethoprim (BACTRIM DS) 800-160 MG tablet Take 1 tablet by mouth 2 (two) times daily.  [name] telmisartan (MICARDIS) 20 MG tablet Take 1 tablet (20 mg total) by mouth daily.  [name] triamcinolone cream (KENALOG) 0.1 % Apply 1 application topically daily as needed (for irritation).   . venlafaxine XR (EFFEXOR-XR) 75 MG 24 hr capsule Take 1 capsule (75 mg total) by mouth 2 (two) times daily.  . [DISCONTINUED] metFORMIN (GLUCOPHAGE) 1000 MG tablet Take 1 tablet (1,000 mg total) by mouth 2 (two) times daily with a meal.  . [DISCONTINUED] metroNIDAZOLE (FLAGYL) 500 MG tablet Take 1 tablet (500 mg total) by mouth 3 (three) times daily.  . [DISCONTINUED] nitroGLYCERIN (NITROSTAT) 0.4 MG SL tablet Place 1 tablet (0.4 mg total) under the tongue every 5 (five) minutes as needed for chest pain. Reported on 01/05/2016    Allergies  Allergen Reactions  . Methotrexate Other (See Comments)    Peri-oral and buccal lesions  . Vancomycin Other (See Comments)    DOSE RELATED NEPHROTOXICITY  . Lisinopril Cough  . Chlorhexidine Itching  . Clindamycin/Lincomycin Nausea And Vomiting and Rash    RASH > ALLERGY INTOLERANCE > NAUSEA & VOMITING  . Doxycycline Rash  . Teflaro [Ceftaroline] Rash    Tolerates ceftriaxone     / Social History   Tobacco Use  . Smoking status: Never Smoker  . Smokeless tobacco: Never Used  Substance Use Topics  . Alcohol use: No    Alcohol/week: 0.0 oz  . Drug use: No   Social History   Social History Narrative   Married 1994. 2 sons  who both live close and 1 grandson.         Disability due to sarcoidosis. 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  Wt Readings from Last 3 Encounters:  01/12/18 242 lb (109.8 kg)  01/10/18 242 lb (109.8 kg)  12/30/17 237 lb (107.5 kg)    PHYSICAL EXAM BP 130/68 (BP Location: Left Arm, Patient Position: Sitting, Cuff Size: Large)   Pulse 80   Ht _0  (1.676 m)   Wt 242 lb (109.8 kg)   BMI 39.06 kg/m   Physical Exam  Constitutional: She is oriented to person, place, and time. She appears well-developed and well-nourished. No distress.  Essentially morbidly obese with some mild cushingoid features.  Well-groomed.  In good spirits.  HENT:  Head: Normocephalic and atraumatic.  Neck: No hepatojugular reflux and no JVD present. Carotid bruit is not present.  Cardiovascular: Normal rate, regular rhythm, normal heart sounds and intact distal pulses. Exam reveals no gallop and no friction rub.  No murmur heard. Pulmonary/Chest: No respiratory distress (Baseline accessory muscle use, but not in any distress.). She exhibits no tenderness.  Diffuse mild crackles with some rhonchorous sounds.  No wheezes or rales.  Overall diminished breath sounds.  Abdominal: Soft. Bowel sounds are normal. She exhibits no distension.  Unable to palpate HSM  Musculoskeletal: Normal range of motion. She exhibits no edema.  Neurological: She is alert and oriented to person, place, and time.  Psychiatric: She has a normal mood  and affect. Her behavior is normal. Judgment and thought content normal.  Nursing note and vitals reviewed.   Adult ECG Report n/a  Other studies Reviewed: Additional studies/ records that were reviewed today include:  Recent Labs:  Checked by PCP  Lab Results  Component Value Date   CHOL 163 03/10/2017   HDL 35 (L) 03/10/2017   LDLCALC 49 03/10/2017   LDLDIRECT 76.0 01/15/2016   TRIG 197 (H) 09/20/2017   CHOLHDL 4.7 03/10/2017    ASSESSMENT / PLAN: Problem List Items Addressed This Visit    Morbid obesity ([place])    She is trying to adjust her diet with weight watchers and other type of diet programs.  Having a hard time staying the course.  Also needs to work on just try to get some exercise.  Goal is to lose 10% of body weight in 1 year, which would be 20-24 pounds.      Hypertensive heart disease with chronic diastolic congestive heart failure (HCC) - Primary (Chronic)    Several cardiac catheterizations have been performed all showing minimal CAD.  The most recent cath showed normal LVEDP.  She seems euvolemic today. Blood pressure well controlled on current dose of carvedilol and diltiazem along with Micardis.. She is on a stable dose of Lasix which she may or may not be able to take once daily.      Relevant Medications   nitroGLYCERIN (NITROSTAT) 0.4 MG SL tablet   Hyperlipidemia (Chronic)  Triglycerides slightly elevated at this time, but better.  She is on stable dose of atorvastatin plus fenofibrate. We discussed dietary modification with plans to try to lose 20 pounds in the next year.      Relevant Medications   nitroGLYCERIN (NITROSTAT) 0.4 MG SL tablet   Essential hypertension (Chronic)    Well-controlled.  Her losartan was converted to Micardis/telmisartan.  She is on stable dose of carvedilol as well as diltiazem.  (Diltiazem presumably for pulmonary vasodilation).  Also on standing diuretic.      Relevant Medications   nitroGLYCERIN (NITROSTAT) 0.4 MG SL  tablet   Chest pain    She has off and on episodes of chest pain and had an episode in June that led to hospitalization with a Myoview and then cardiac catheterization with minimal CAD. My recommendation, where she to have further symptoms that were concerning would be to do a coronary CT angiogram as opposed to Myoview since she has now had a false positive Myoview I do not think she is likely to develop CAD and therefore would not Unless there is a positive coronary CTA or positive enzymes. She is on diltiazem for vasodilation for possible spasm   As well as for possible sarcoid related pulmonary hypertension.         Current medicines are reviewed at length with the patient today. (+/- concerns) n/a The following changes have been made: n/a  Patient Instructions  NO CHANGE  CURRENT  MEDICATIONS     Your physician wants you to follow-up in [place]. You will receive a reminder letter in the mail two months in advance. If you don't receive a letter, please call our office to schedule the follow-up appointment.   If you need a refill on your cardiac medications before your next appointment, please call your pharmacy.    Studies Ordered:   No orders of the defined types were placed in this encounter.     [name], M.D., M.S. Interventional Cardiologist   Pager # [phone] Phone # [phone] [address] [place], [place] [address]

## 2018-01-11 DIAGNOSIS — M25562 Pain in left knee: Secondary | ICD-10-CM | POA: Diagnosis not present

## 2018-01-11 DIAGNOSIS — M6281 Muscle weakness (generalized): Secondary | ICD-10-CM | POA: Diagnosis not present

## 2018-01-11 DIAGNOSIS — M25462 Effusion, left knee: Secondary | ICD-10-CM | POA: Diagnosis not present

## 2018-01-11 DIAGNOSIS — M25662 Stiffness of left knee, not elsewhere classified: Secondary | ICD-10-CM | POA: Diagnosis not present

## 2018-01-12 ENCOUNTER — Encounter: Payer: Self-pay | Admitting: Cardiology

## 2018-01-12 ENCOUNTER — Encounter (INDEPENDENT_AMBULATORY_CARE_PROVIDER_SITE_OTHER): Payer: Self-pay | Admitting: Orthopedic Surgery

## 2018-01-12 ENCOUNTER — Ambulatory Visit (INDEPENDENT_AMBULATORY_CARE_PROVIDER_SITE_OTHER): Payer: BLUE CROSS/BLUE SHIELD | Admitting: Orthopedic Surgery

## 2018-01-12 VITALS — Ht 66.0 in | Wt 242.0 lb

## 2018-01-12 DIAGNOSIS — Z96652 Presence of left artificial knee joint: Secondary | ICD-10-CM

## 2018-01-12 NOTE — Assessment & Plan Note (Signed)
Well-controlled.  Her losartan was converted to Micardis/telmisartan.  She is on stable dose of carvedilol as well as diltiazem.  (Diltiazem presumably for pulmonary vasodilation).  Also on standing diuretic.

## 2018-01-12 NOTE — Assessment & Plan Note (Signed)
She has off and on episodes of chest pain and had an episode in June that led to hospitalization with a Myoview and then cardiac catheterization with minimal CAD. My recommendation, where she to have further symptoms that were concerning would be to do a coronary CT angiogram as opposed to Myoview since she has now had a false positive Myoview I do not think she is likely to develop CAD and therefore would not Unless there is a positive coronary CTA or positive enzymes. She is on diltiazem for vasodilation for possible spasm   As well as for possible sarcoid related pulmonary hypertension.

## 2018-01-12 NOTE — Assessment & Plan Note (Signed)
She is trying to adjust her diet with weight watchers and other type of diet programs.  Having a hard time staying the course.  Also needs to work on just try to get some exercise.  Goal is to lose 10% of body weight in 1 year, which would be 20-24 pounds.

## 2018-01-12 NOTE — Assessment & Plan Note (Signed)
Several cardiac catheterizations have been performed all showing minimal CAD.  The most recent cath showed normal LVEDP.  She seems euvolemic today. Blood pressure well controlled on current dose of carvedilol and diltiazem along with Micardis.. She is on a stable dose of Lasix which she may or may not be able to take once daily.

## 2018-01-12 NOTE — Assessment & Plan Note (Signed)
Triglycerides slightly elevated at this time, but better.  She is on stable dose of atorvastatin plus fenofibrate. We discussed dietary modification with plans to try to lose 20 pounds in the next year.

## 2018-01-12 NOTE — Progress Notes (Signed)
Office Visit Note   Patient: [name]           Date of Birth: 03-Oct-1961           MRN: [id number] Visit Date: 01/12/2018              Requested by: [name], MD [place], [place] [id number] PCP: [name], MD  Chief Complaint  Patient presents with  . Left Knee - Routine Post Op    12/07/17 left total knee replacement       HPI: Patient presents follow-up status post left total knee arthroplasty she is quite pleased she has no pain or symptoms.  Assessment & Plan: Visit Diagnoses:  1. Total knee replacement status, left     Plan: Patient will continue with strengthening increasing activities as tolerated she has no restrictions.  Follow-Up Instructions: Return if symptoms worsen or fail to improve.   Ortho Exam  Patient is alert, oriented, no adenopathy, well-dressed, normal affect, normal respiratory effort. Examination patient has a normal gait she has full extension she still has a little bit of swelling and some slight keloiding of the scar the importance of scar massage was discussed the importance of continuing work on extension and strengthening was discussed.  She will continue working with her physical therapy.  Imaging: No results found. No images are attached to the encounter.  Labs: Lab Results  Component Value Date   HGBA1C 8.4 (H) 11/29/2017   HGBA1C 8.7 (H) 09/20/2017   HGBA1C 7.8 (H) 08/13/2017   ESRSEDRATE 28 06/16/2017   ESRSEDRATE 5 01/26/2017   ESRSEDRATE 27 10/14/2016   CRP 27.1 (H) 06/16/2017   CRP 17.3 (H) 01/26/2017   CRP 24.0 (H) 10/14/2016   LABURIC 5.1 01/26/2017   REPTSTATUS 09/22/2017 FINAL 09/20/2017   GRAMSTAIN  08/14/2017    MODERATE WBC PRESENT, PREDOMINANTLY PMN MODERATE BUDDING YEAST SEEN    CULT NO GROUP A STREP (S.PYOGENES) ISOLATED 09/20/2017   LABORGA PSEUDOMONAS FLUORESCENS 08/14/2017    @LABSALLVALUES (HGBA1)@  Body mass index is 39.06 kg/m.  Orders:  No orders of the  defined types were placed in this encounter.  No orders of the defined types were placed in this encounter.    Procedures: No procedures performed  Clinical Data: No additional findings.  ROS:  All other systems negative, except as noted in the HPI. Review of Systems  Objective: Vital Signs: Ht 5\' 6"  (1.676 m)   Wt 242 lb (109.8 kg)   BMI 39.06 kg/m   Specialty Comments:  No specialty comments available.  PMFS History: Patient Active Problem List   Diagnosis Date Noted  . Total knee replacement status, left 12/07/2017  . Unilateral primary osteoarthritis, left knee 11/07/2017  . Dysphagia 09/20/2017  . Epiglottitis   . Plantar fasciitis, right 07/13/2017  . S/P total knee arthroplasty, right 06/29/2017  . Osteoarthrosis, localized, primary, knee, right   . Sprain of calcaneofibular ligament of right ankle 06/06/2017  . Osteoarthritis of right knee 01/26/2017  . Multiple allergies 10/14/2016  . Onychomycosis 10/27/2015  . Osteomyelitis of left foot ([place]) 05/29/2015  . CKD (chronic kidney disease) stage 3, GFR 30-59 ml/min (HCC) 04/27/2015  . MRSA (methicillin resistant staph aureus) culture positive 03/27/2015  . Wound infection complicating hardware ([place]) 03/27/2015  . Fatty liver 09/30/2014  . Hot flashes 07/15/2014  . Abnormal SPEP 04/17/2014  . Fracture of left leg 04/17/2014  . Cushingoid side effect of steroids ([place]) 04/17/2014  . Internal  hemorrhoids   . Depression   . Preoperative clearance 03/25/2014  . Obstructive chronic bronchitis without exacerbation ([place]) 09/18/2013  . Chest pain 04/11/2013  . Hypertensive heart disease with chronic diastolic congestive heart failure ([place])   . Solitary pulmonary nodule, on CT 02/2013 - stable over 2 years in 2015 02/20/2013  . Gout 08/20/2010  . Anemia 09/18/2009  . Morbid obesity ([place]) 06/04/2009  . Sleep apnea 04/21/2009  . Sarcoidosis of lung ([place]) 04/10/2007  . Hyperlipidemia 08/21/2006  . Essential  hypertension 08/21/2006  . GERD 08/21/2006  . Type II diabetes mellitus with renal manifestations ([place]) 08/21/2006   Past Medical History:  Diagnosis Date  . Abnormal SPEP 04/17/2014  . Acute left ankle pain 01/26/2017  . ANEMIA-UNSPECIFIED 09/18/2009  . Anxiety   . Arthritis   . CHF (congestive heart failure) ([place])   . Chronic diastolic heart failure, NYHA class 2 (HCC)    LVEDP roughly 20% by cath  . COPD (chronic obstructive pulmonary disease) ([place])   . Depression   . DIABETES MELLITUS, TYPE II 08/21/2006  . Diabetic osteomyelitis ([place]) 05/29/2015  . Fracture of 5th metatarsal    non union  . GERD 08/21/2006  . GOUT 08/20/2010  . Hx of umbilical hernia repair   . HYPERLIPIDEMIA 08/21/2006  . HYPERTENSION 08/21/2006  . Infection of wound due to methicillin resistant Staphylococcus aureus (MRSA)   . Internal hemorrhoids   . Multiple allergies 10/14/2016  . OBESITY 06/04/2009  . Onychomycosis 10/27/2015  . Osteomyelitis of left foot ([place]) 05/29/2015  . Pulmonary sarcoidosis ([place])    Followed locally by pulmonology, but also by Dr. Casper Harrison at [facility]  . Right knee pain 01/26/2017  . Vocal cord dysfunction   . Wears partial dentures     Family History  Problem Relation Age of Onset  . Diabetes Father   . Heart attack Father 69  . Coronary artery disease Father   . Heart failure Father   . COPD Mother   . Emphysema Mother   . Asthma Mother   . Heart failure Mother   . Breast cancer Mother   . Heart attack Maternal Grandfather   . Sarcoidosis Maternal Uncle   . Lung cancer Brother   . Diabetes Brother   . Colon cancer Neg Hx     Past Surgical History:  Procedure Laterality Date  . ABDOMINAL HYSTERECTOMY    . APPENDECTOMY    . BLADDER SUSPENSION  11/11/2011   Procedure: TRANSVAGINAL TAPE (TVT) PROCEDURE;  Surgeon: Olga Millers, MD;  Location: [facility];  Service: Gynecology;  Laterality: N/A;  . CARDIAC CATHETERIZATION  07/2010   LVEF 50-55% WITH VERY MILD  GLOBAL HYPOKINESIA; ESSENTIALLY NORMAL CORONARY ARTERIES; NORMAL LV FUNCTION  . CAROTIDS  02/18/11   CAROTID DUPLEX; VERTEBRALS ARE PATENT WITH ANTEGRADE FLOW. ICA/CCA RATIO 1.61 ON RIGHT AND 0.75 ON LEFT  . CHOLECYSTECTOMY  1984  . CYSTOSCOPY  11/11/2011   Procedure: CYSTOSCOPY;  Surgeon: Olga Millers, MD;  Location: [facility];  Service: Gynecology;  Laterality: N/A;  . DOPPLER ECHOCARDIOGRAPHY  02/12/2013   LV FUNCTION, SIZE NORMAL; MILD CONCENTRIC LVH; EST EF 55-65%; WALL MOTION NORMAL  . EXTUBATION (ENDOTRACHEAL) IN OR N/A 09/23/2017   Procedure: EXTUBATION (ENDOTRACHEAL) IN OR;  Surgeon: Helayne Seminole, MD;  Location: [facility];  Service: ENT;  Laterality: N/A;  . FIBEROPTIC LARYNGOSCOPY AND TRACHEOSCOPY N/A 09/23/2017   Procedure: FLEXIBLE FIBEROPTIC LARYNGOSCOPY;  Surgeon: Helayne Seminole, MD;  Location: [facility];  Service:  ENT;  Laterality: N/A;  . FRACTURE SURGERY     foot  . HERNIA REPAIR    . I&D EXTREMITY Left 06/27/2015   Procedure: Partial Excision Left Calcaneus, Place Antibiotic Beads, and Wound VAC;  Surgeon: Newt Minion, MD;  Location: [facility];  Service: Orthopedics;  Laterality: Left;  . KNEE ARTHROSCOPY     right  . LEFT AND RIGHT HEART CATHETERIZATION WITH CORONARY ANGIOGRAM N/A 04/23/2013   Procedure: LEFT AND RIGHT HEART CATHETERIZATION WITH CORONARY ANGIOGRAM;  Surgeon: Leonie Man, MD;  Location: [facility];  Service: Cardiovascular;  Laterality: N/A;  . LEFT HEART CATH AND CORONARY ANGIOGRAPHY N/A 03/11/2017   Procedure: Left Heart Cath and Coronary Angiography;  Surgeon: Sherren Mocha, MD;  Location: [facility];  Service: Cardiovascular;  Laterality: N/A;  . LexiScan Myoview  03/09/2013   EF 50%; NORMAL MYOCARDIAL PERFUSION STUDY - breast attenuation  . METATARSAL OSTEOTOMY WITH OPEN REDUCTION INTERNAL FIXATION (ORIF) METATARSAL WITH FUSION Left 04/09/2014   Procedure: LEFT FOOT FRACTURE OPEN TREATMENT METATARSAL INCLUDES INTERNAL FIXATION EACH;  Surgeon:  Lorn Junes, MD;  Location: [facility];  Service: Orthopedics;  Laterality: Left;  . NISSEN FUNDOPLICATION  9937  . Right and left CARDIAC CATHETERIZATION  04/23/2013   Angiographic normal coronaries; LVEDP 20 mmHg, PCWP 12-14 mmHg, RAP 12 mmHg.; Fick CO/CI 4.9/2.2  . TOTAL KNEE ARTHROPLASTY Right 06/29/2017   Procedure: RIGHT TOTAL KNEE ARTHROPLASTY;  Surgeon: Newt Minion, MD;  Location: [facility];  Service: Orthopedics;  Laterality: Right;  . TOTAL KNEE ARTHROPLASTY Left 12/07/2017  . TOTAL KNEE ARTHROPLASTY Left 12/07/2017   Procedure: LEFT TOTAL KNEE ARTHROPLASTY;  Surgeon: Newt Minion, MD;  Location: [facility];  Service: Orthopedics;  Laterality: Left;  . TRACHEOSTOMY TUBE PLACEMENT N/A 09/20/2017   Procedure: AWAKE INTUBATION WITH ANESTHESIA WITH VIDEO ASSISTANCE;  Surgeon: Helayne Seminole, MD;  Location: [facility];  Service: ENT;  Laterality: N/A;  . TUBAL LIGATION     with reversal in 1994  . VENTRAL HERNIA REPAIR     Social History   Occupational History  . Occupation: DISABLED  Tobacco Use  . Smoking status: Never Smoker  . Smokeless tobacco: Never Used  Substance and Sexual Activity  . Alcohol use: No    Alcohol/week: 0.0 oz  . Drug use: No  . Sexual activity: Yes    Birth control/protection: Surgical

## 2018-01-16 DIAGNOSIS — M6281 Muscle weakness (generalized): Secondary | ICD-10-CM | POA: Diagnosis not present

## 2018-01-16 DIAGNOSIS — M25562 Pain in left knee: Secondary | ICD-10-CM | POA: Diagnosis not present

## 2018-01-16 DIAGNOSIS — M25662 Stiffness of left knee, not elsewhere classified: Secondary | ICD-10-CM | POA: Diagnosis not present

## 2018-01-16 DIAGNOSIS — M25462 Effusion, left knee: Secondary | ICD-10-CM | POA: Diagnosis not present

## 2018-01-17 ENCOUNTER — Encounter: Payer: Self-pay | Admitting: Family Medicine

## 2018-01-17 ENCOUNTER — Ambulatory Visit (INDEPENDENT_AMBULATORY_CARE_PROVIDER_SITE_OTHER): Payer: BLUE CROSS/BLUE SHIELD | Admitting: Family Medicine

## 2018-01-17 VITALS — BP 138/78 | HR 70 | Temp 97.9°F | Resp 18 | Ht 68.0 in | Wt 240.6 lb

## 2018-01-17 DIAGNOSIS — N390 Urinary tract infection, site not specified: Secondary | ICD-10-CM

## 2018-01-17 DIAGNOSIS — R300 Dysuria: Secondary | ICD-10-CM

## 2018-01-17 LAB — POCT URINALYSIS DIPSTICK
Bilirubin, UA: NEGATIVE
Blood, UA: NEGATIVE
Glucose, UA: NEGATIVE
Ketones, UA: NEGATIVE
Leukocytes, UA: NEGATIVE
Nitrite, UA: NEGATIVE
Protein, UA: NEGATIVE
Spec Grav, UA: 1.015 (ref 1.010–1.025)
Urobilinogen, UA: 0.2 E.U./dL
pH, UA: 6 (ref 5.0–8.0)

## 2018-01-17 IMAGING — DX DG CHEST 2V
2 series · 2 of 2 positions shown · non-contrast
Comparison: 12/03/2015

CLINICAL DATA: Central left-sided chest pain with left arm
numbness, tingling, and shortness of breath since last night.
Nausea. History of sarcoidosis.

EXAM:
CHEST  2 VIEW

[w chest pa]
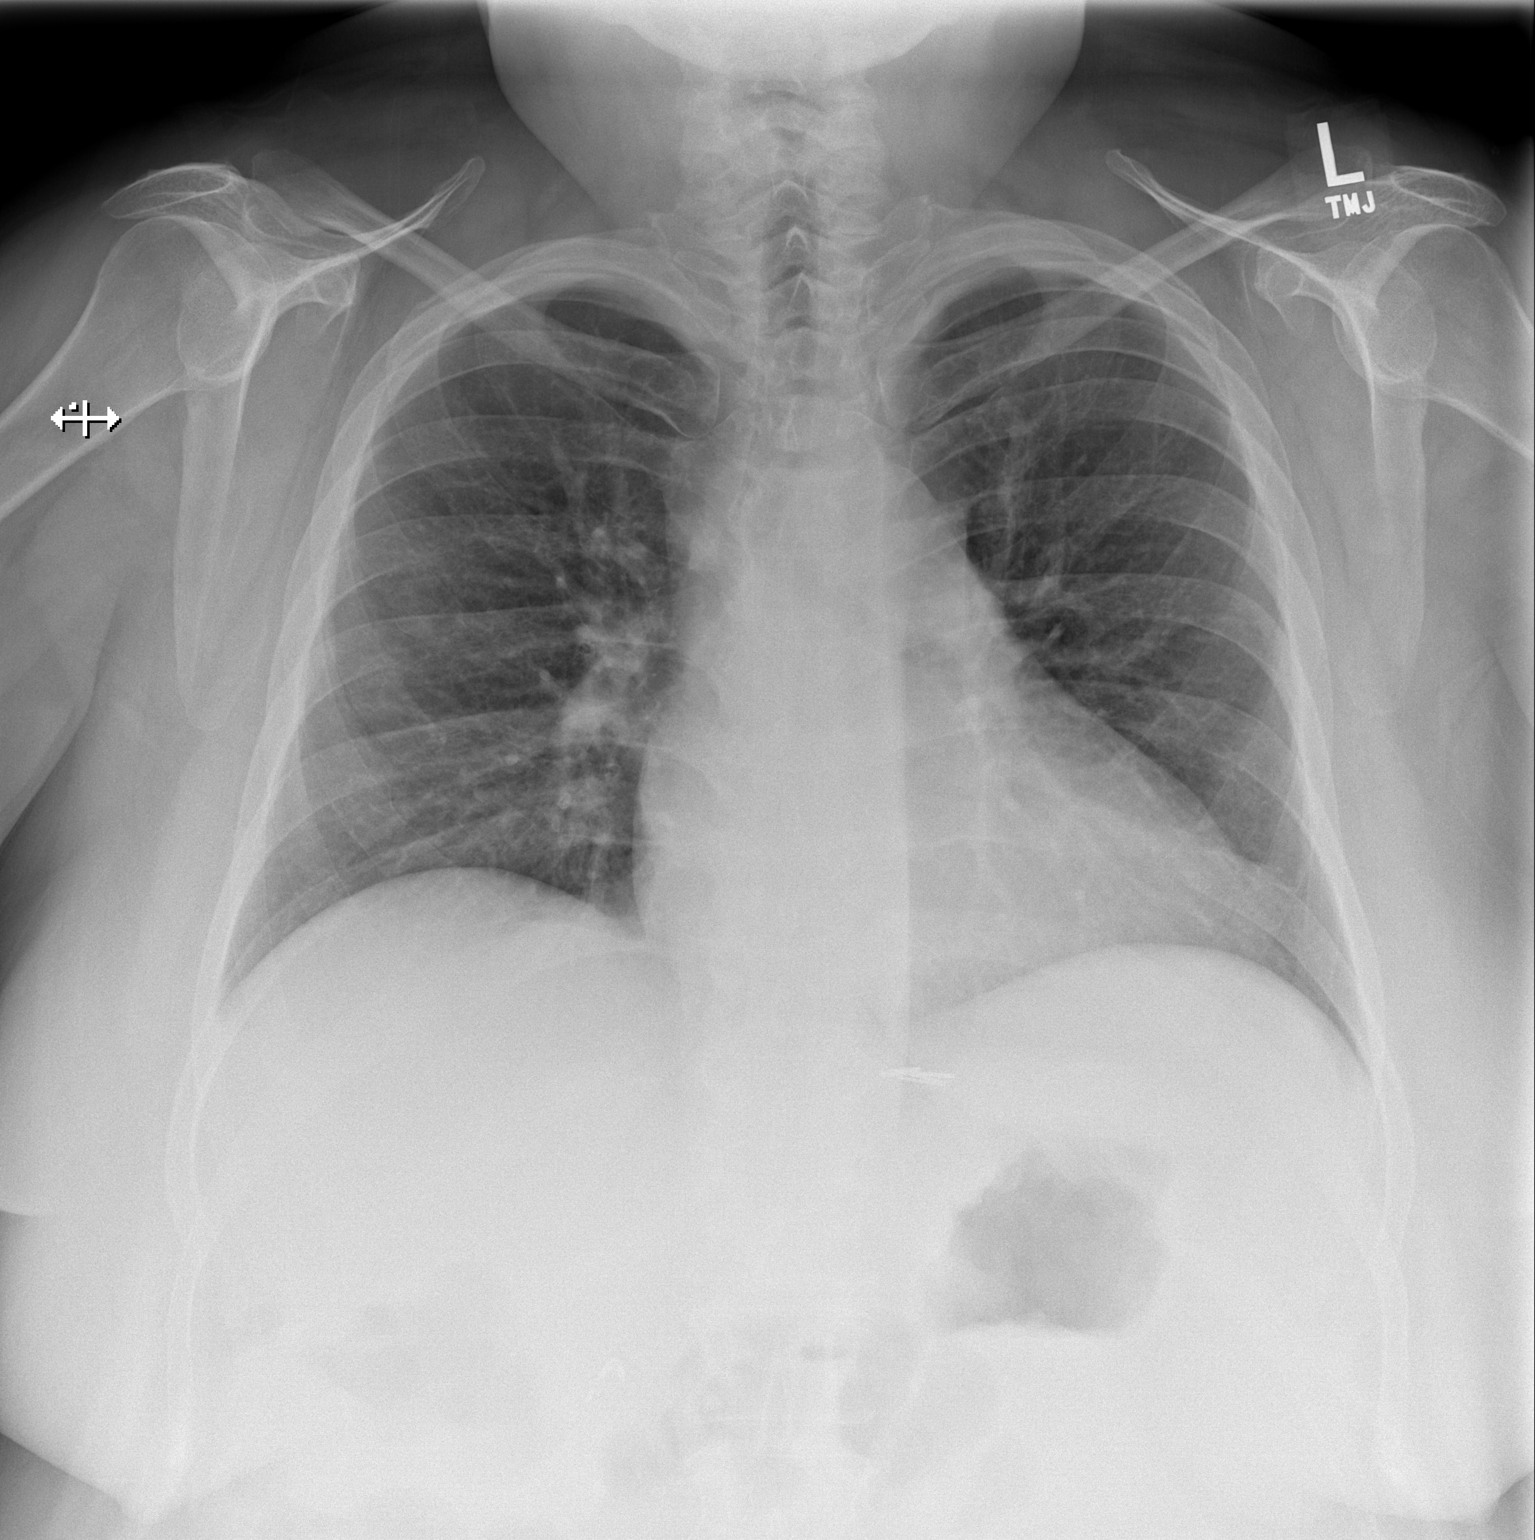

[w chest lat]
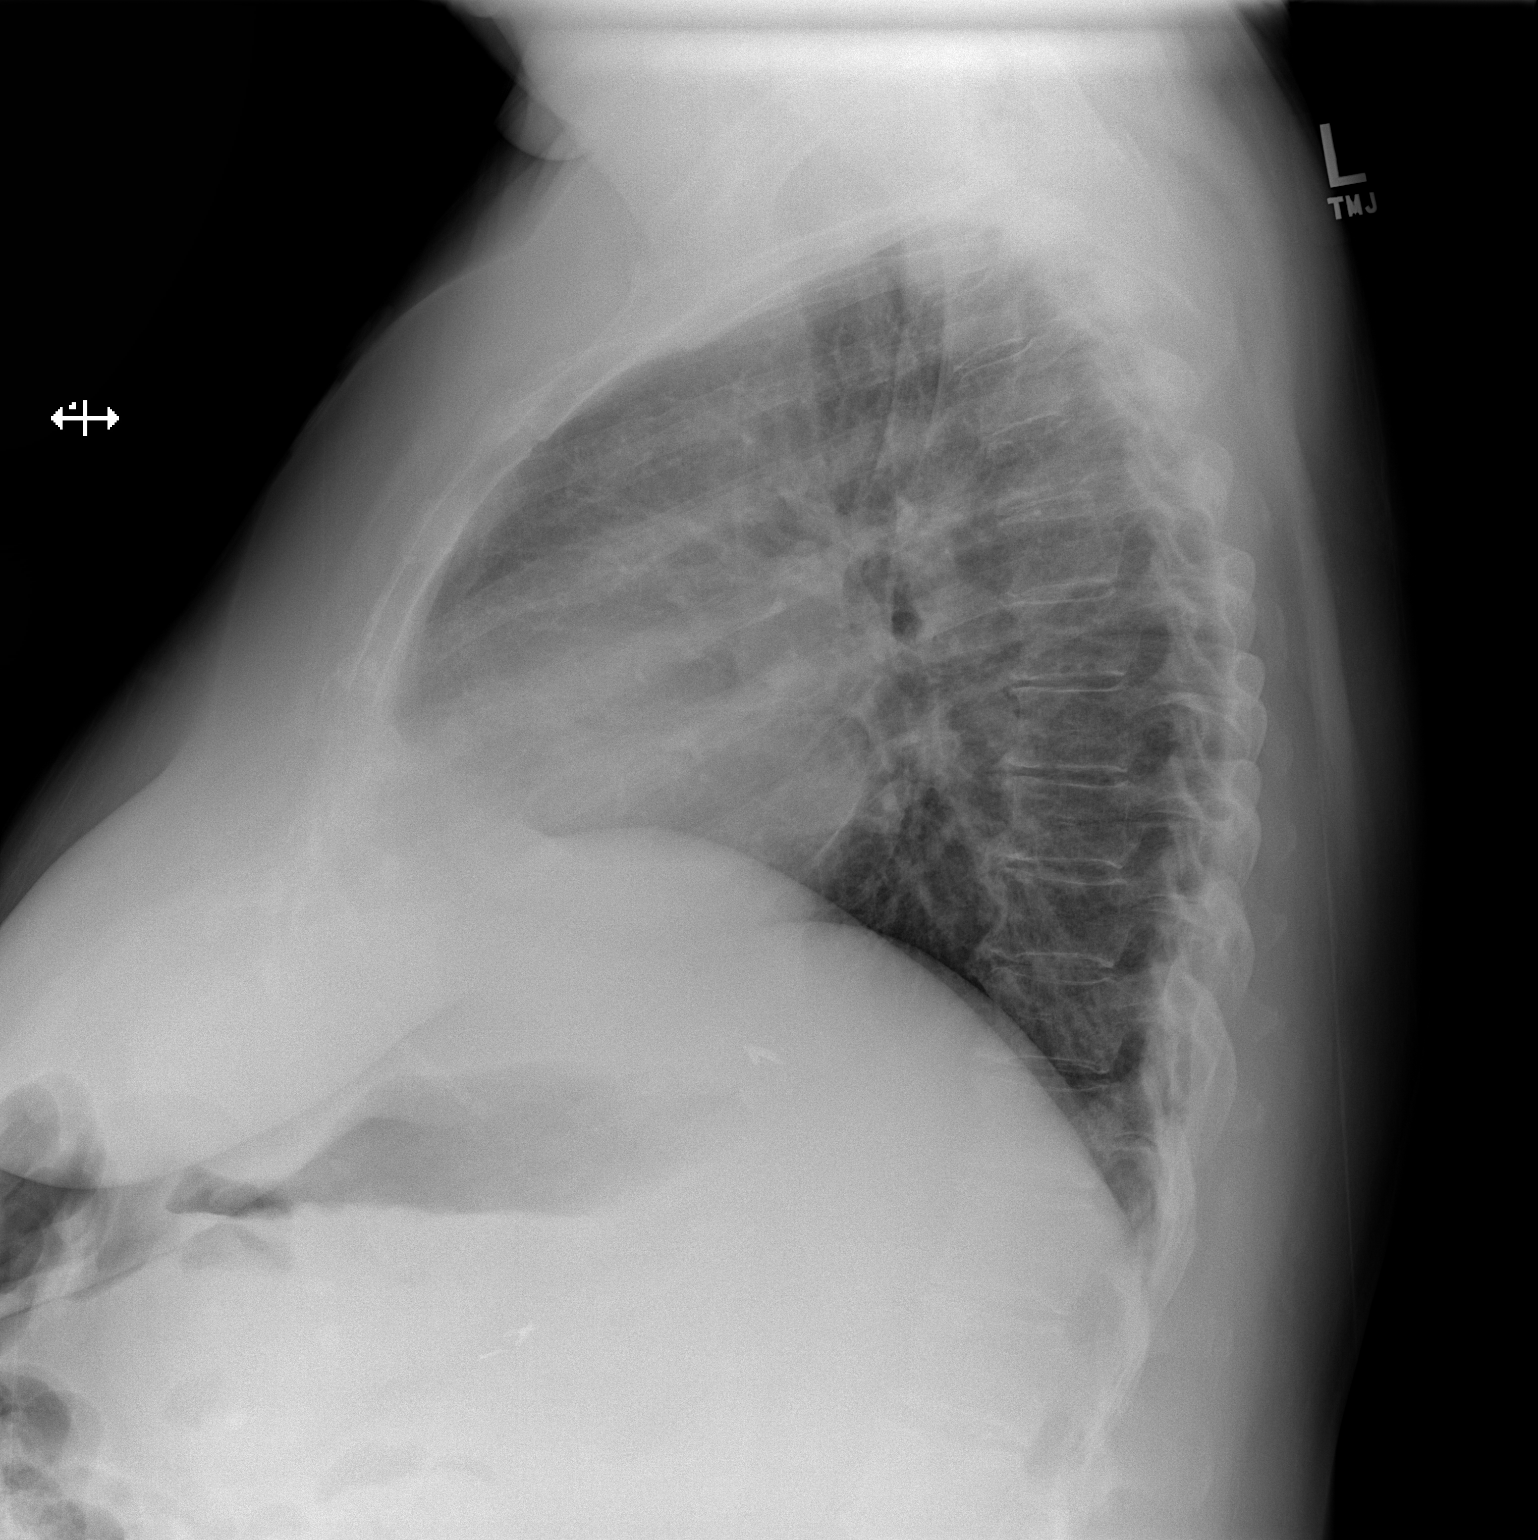

[2 of 2 positions shown; findings below may reference images not displayed]

FINDINGS: The cardiac silhouette appears slightly enlarged. No airspace
consolidation, edema, pleural effusion, or pneumothorax is
identified. Mild chronic bronchitic changes are similar to the prior
study. Upper abdominal surgical clips are noted. No acute osseous
abnormality is seen.
IMPRESSION: No active cardiopulmonary disease.

## 2018-01-17 MED ORDER — CIPROFLOXACIN HCL 500 MG PO TABS: 500.0000 mg | ORAL_TABLET | Freq: Two times a day (BID) | ORAL | 0 refills | Status: DC

## 2018-01-17 NOTE — Patient Instructions (Signed)
Please start the cipro.  I will place a referral to specialist.   We will call you with the culture results when they are available.  Take care, Dr Jerline Pain

## 2018-01-17 NOTE — Progress Notes (Signed)
    Subjective:  [name] is a 57 y.o. female who presents today for same-day appointment with a chief complaint of dysuria.   HPI:  Dysuria, Acute Issue Started a few days ago. Associated symptoms include increased urinary frequency, right mid back pain, and lower abdominal pain.  Symptoms are worsening.  Symptoms are consistent with her past UTIs.  No fevers or chills.  No diarrhea or constipation.  Patient is having 2-3 soft bowel movements daily.  No obvious alleviating or aggravating factors.  Patient was seen about a month ago and diagnosed with a UTI.  She was treated with ciprofloxacin which worked well.  Patient reports having UTI nearly monthly for the past 5-6 months.  Patient is concerned because her mother died of kidney issues.  ROS: Per HPI  PMH: She reports that she has never smoked. She has never used smokeless tobacco. She reports that she does not drink alcohol or use drugs.  Objective:  Physical Exam: BP 138/78 (BP Location: Left Arm)   Pulse 70   Temp 97.9 F (36.6 C)   Resp 18   Ht 5\' 8"  (1.727 m)   Wt 240 lb 9.6 oz (109.1 kg)   SpO2 96%   BMI 36.58 kg/m   Gen: NAD, resting comfortably CV: RRR with no murmurs appreciated Pulm: NWOB, CTAB with no crackles, wheezes, or rhonchi GI: Normal bowel sounds present. Soft, Nontender, Nondistended. MSK: Right CVA tenderness noted.  Results for orders placed or performed in visit on 01/17/18 (from the past 24 hour(s))  POCT urinalysis dipstick     Status: Normal   Collection Time: 01/17/18  1:42 PM  Result Value Ref Range   Color, UA Yellow    Clarity, UA Clear    Glucose, UA Negative    Bilirubin, UA Negative    Ketones, UA Negative    Spec Grav, UA 1.015 1.010 - 1.025   Blood, UA Negative    pH, UA 6.0 5.0 - 8.0   Protein, UA Negative    Urobilinogen, UA 0.2 0.2 or 1.0 E.U./dL   Nitrite, UA Negative    Leukocytes, UA Negative Negative   Appearance     Odor     *Note: Due to a large number of  results and/or encounters for the requested time period, some results have not been displayed. A complete set of results can be found in Results Review.   Assessment/Plan:  Dysuria/recurrent UTI Patient's UA is negative, however her symptoms are consistent with UTI.  She does not have any signs of systemic illness today.  We will start a course of ciprofloxacin today as she is had urine cultures in the past that have been sensitive to this.  We will check urine culture today.  Patient has had numerous UTIs over the past several months.  Given this, in addition to her family history we will place referral to urology for further evaluation.    Algis Greenhouse. Jerline Pain, MD 01/17/2018 2:38 PM

## 2018-01-18 LAB — URINE CULTURE
MICRO NUMBER:: 90436350
Result:: NO GROWTH
SPECIMEN QUALITY:: ADEQUATE

## 2018-01-19 DIAGNOSIS — M25562 Pain in left knee: Secondary | ICD-10-CM | POA: Diagnosis not present

## 2018-01-19 DIAGNOSIS — M25662 Stiffness of left knee, not elsewhere classified: Secondary | ICD-10-CM | POA: Diagnosis not present

## 2018-01-19 DIAGNOSIS — M25462 Effusion, left knee: Secondary | ICD-10-CM | POA: Diagnosis not present

## 2018-01-19 DIAGNOSIS — M6281 Muscle weakness (generalized): Secondary | ICD-10-CM | POA: Diagnosis not present

## 2018-01-23 ENCOUNTER — Other Ambulatory Visit: Payer: Self-pay | Admitting: *Deleted

## 2018-01-23 DIAGNOSIS — M25462 Effusion, left knee: Secondary | ICD-10-CM | POA: Diagnosis not present

## 2018-01-23 DIAGNOSIS — M25662 Stiffness of left knee, not elsewhere classified: Secondary | ICD-10-CM | POA: Diagnosis not present

## 2018-01-23 DIAGNOSIS — M25562 Pain in left knee: Secondary | ICD-10-CM | POA: Diagnosis not present

## 2018-01-23 DIAGNOSIS — M6281 Muscle weakness (generalized): Secondary | ICD-10-CM | POA: Diagnosis not present

## 2018-01-23 MED ORDER — GABAPENTIN 100 MG PO CAPS: 100.0000 mg | ORAL_CAPSULE | Freq: Three times a day (TID) | ORAL | 0 refills | Status: DC

## 2018-01-24 ENCOUNTER — Ambulatory Visit (INDEPENDENT_AMBULATORY_CARE_PROVIDER_SITE_OTHER): Payer: BLUE CROSS/BLUE SHIELD | Admitting: Family Medicine

## 2018-01-24 ENCOUNTER — Encounter: Payer: Self-pay | Admitting: Family Medicine

## 2018-01-24 VITALS — BP 130/56 | HR 84 | Temp 98.2°F | Ht 68.0 in | Wt 240.4 lb

## 2018-01-24 DIAGNOSIS — J0101 Acute recurrent maxillary sinusitis: Secondary | ICD-10-CM

## 2018-01-24 DIAGNOSIS — J449 Chronic obstructive pulmonary disease, unspecified: Secondary | ICD-10-CM | POA: Diagnosis not present

## 2018-01-24 DIAGNOSIS — D860 Sarcoidosis of lung: Secondary | ICD-10-CM

## 2018-01-24 MED ORDER — PREDNISONE 20 MG PO TABS: ORAL_TABLET | ORAL | 0 refills | Status: DC

## 2018-01-24 MED ORDER — AMOXICILLIN-POT CLAVULANATE 875-125 MG PO TABS: 1.0000 | ORAL_TABLET | Freq: Two times a day (BID) | ORAL | 0 refills | Status: AC

## 2018-01-24 MED ORDER — GUAIFENESIN-CODEINE 100-10 MG/5ML PO SOLN: 5.0000 mL | Freq: Four times a day (QID) | ORAL | 0 refills | Status: DC | PRN

## 2018-01-24 NOTE — Progress Notes (Signed)
PCP: [name], MD  Subjective:  [name] is a 57 y.o. year old very pleasant female patient who presents with sinusitis symptoms including nasal congestion, sinus tenderness. She also has some respiratory complaints including cough, chest congestion, wheezing. Cough mianly dry. Slightly more short of breath than baseline.  -other symptoms include: green nasal discharge.  -day of illness:#3. Symptoms worse at night -Symptoms show no change today/stable -previous treatments: tussionex leftover at home helping. Nebulizer before bed.  -sick contacts/travel/risks: denies flu exposure.  -Hx of: allergies  ROS-denies fever, SOB, NVD, tooth pain  Pertinent Past Medical History-  Patient Active Problem List   Diagnosis Date Noted  . Obstructive chronic bronchitis without exacerbation ([place]) 09/18/2013    Priority: High  . Chest pain 04/11/2013    Priority: High  . Hypertensive heart disease with chronic diastolic congestive heart failure ([place])     Priority: High  . Sarcoidosis of lung ([place]) 04/10/2007    Priority: High  . Type II diabetes mellitus with renal manifestations ([place]) 08/21/2006    Priority: High  . Epiglottitis     Priority: Medium  . Osteoarthritis of right knee 01/26/2017    Priority: Medium  . CKD (chronic kidney disease) stage 3, GFR 30-59 ml/min (HCC) 04/27/2015    Priority: Medium  . Fatty liver 09/30/2014    Priority: Medium  . Depression     Priority: Medium  . Gout 08/20/2010    Priority: Medium  . Anemia 09/18/2009    Priority: Medium  . Sleep apnea 04/21/2009    Priority: Medium  . Hyperlipidemia 08/21/2006    Priority: Medium  . Essential hypertension 08/21/2006    Priority: Medium  . Dysphagia 09/20/2017    Priority: Low  . Plantar fasciitis, right 07/13/2017    Priority: Low  . Osteoarthrosis, localized, primary, knee, right     Priority: Low  . Sprain of calcaneofibular ligament of right ankle 06/06/2017    Priority: Low  .  Onychomycosis 10/27/2015    Priority: Low  . MRSA (methicillin resistant staph aureus) culture positive 03/27/2015    Priority: Low  . Hot flashes 07/15/2014    Priority: Low  . Abnormal SPEP 04/17/2014    Priority: Low  . Fracture of left leg 04/17/2014    Priority: Low  . Cushingoid side effect of steroids ([place]) 04/17/2014    Priority: Low  . Internal hemorrhoids     Priority: Low  . Preoperative clearance 03/25/2014    Priority: Low  . Solitary pulmonary nodule, on CT 02/2013 - stable over 2 years in 2015 02/20/2013    Priority: Low  . Morbid obesity ([place]) 06/04/2009    Priority: Low  . GERD 08/21/2006    Priority: Low  . Total knee replacement status, left 12/07/2017  . Unilateral primary osteoarthritis, left knee 11/07/2017  . S/P total knee arthroplasty, right 06/29/2017  . Multiple allergies 10/14/2016  . Osteomyelitis of left foot ([place]) 05/29/2015  . Wound infection complicating hardware ([place]) 03/27/2015    Medications- reviewed  Current Outpatient Medications  Medication Sig Dispense Refill  . albuterol (PROVENTIL HFA;VENTOLIN HFA) 108 (90 Base) MCG/ACT inhaler Inhale 1-2 puffs into the lungs every 6 (six) hours as needed for wheezing or shortness of breath. 8 g 5  . allopurinol (ZYLOPRIM) 100 MG tablet Take 100 mg by mouth 2 (two) times daily.     [name] aspirin EC 81 MG tablet Take 81 mg by mouth daily.     [name] atorvastatin (LIPITOR) 40  MG tablet TAKE 1 TAB BY MOUTH ONCE DAILY (Patient taking differently: Take 40 mg by mouth once a day) 90 tablet 3  . BAYER MICROLET LANCETS lancets Use as instructed to check sugar 3 times daily 300 each 5  . benzonatate (TESSALON) 200 MG capsule Take 200 mg by mouth 3 (three) times daily as needed for cough.     . Blood Glucose Monitoring Suppl (BAYER CONTOUR MONITOR) w/Device KIT Use daily to check sugar 1 kit 0  . buPROPion (WELLBUTRIN XL) 300 MG 24 hr tablet Take 1 tablet (300 mg total) by mouth daily. 90 tablet 3  . Calcium Citrate 200  MG TABS Take 200 mg by mouth daily.    . carvedilol (COREG) 25 MG tablet Take 1 tablet (25 mg total) by mouth 2 (two) times daily with a meal. 180 tablet 3  . Cholecalciferol (VITAMIN D) 2000 units tablet Take 2,000 Units by mouth daily.    . colestipol (COLESTID) 1 g tablet Take 2 tablets (2 g total) by mouth 2 (two) times daily. 120 tablet 5  . Cyanocobalamin (VITAMIN B-12 PO) Take 2,500 mcg by mouth daily.     [name] DEXILANT 60 MG capsule TAKE 1 CAPSULE BY MOUTH EVERY DAY 30 capsule 1  . diclofenac sodium (VOLTAREN) 1 % GEL APPLY 2 GRAMS TOPICALLY 4 TIMES A DAY (Patient taking differently: APPLY 2 GRAMS TOPICALLY 4 TIMES A DAY AS NEEDED FOR KNEE PAIN) 100 g 0  . diltiazem (CARDIZEM CD) 120 MG 24 hr capsule TAKE ONE CAPSULE BY MOUTH DAILY 90 capsule 0  . fenofibrate (TRICOR) 145 MG tablet TAKE 1 TABLET (145 MG TOTAL) BY MOUTH DAILY 30 tablet 0  . fluticasone (FLONASE) 50 MCG/ACT nasal spray Place 2 sprays into both nostrils daily. (Patient taking differently: Place 2 sprays into both nostrils daily as needed for allergies. ) 48 g 3  . fluticasone furoate-vilanterol (BREO ELLIPTA) 200-25 MCG/INH AEPB Inhale 1 puff into the lungs daily.    . furosemide (LASIX) 40 MG tablet Take 1 tablet (40 mg total) by mouth 2 (two) times daily. 180 tablet 1  . gabapentin (NEURONTIN) 100 MG capsule Take 1 capsule (100 mg total) by mouth 3 (three) times daily. 270 capsule 0  . glucose blood (BAYER CONTOUR TEST) test strip Use as instructed to check sugar 3 times daily 300 each 5  . HYDROcodone-acetaminophen (NORCO/VICODIN) 5-325 MG tablet Take 1 tablet by mouth every 4 (four) hours as needed for moderate pain. 60 tablet 0  . insulin aspart (NOVOLOG FLEXPEN) 100 UNIT/ML FlexPen Inject 30-35 Units into the skin 3 (three) times daily with meals. 30 mL 5  . Insulin Glargine (TOUJEO SOLOSTAR) 300 UNIT/ML SOPN Inject 40 Units into the skin at bedtime. 5 pen 11  . Insulin Pen Needle 33G X 4 MM MISC 1 each by Does not apply  route 4 (four) times daily. 200 each 11  . LORazepam (ATIVAN) 0.5 MG tablet Take 0.5 mg by mouth every 12 (twelve) hours as needed for anxiety.    . metFORMIN (GLUCOPHAGE) 1000 MG tablet TAKE 1 TABLET BY MOUTH TWICE A DAY WITH A MEAL 180 tablet 0  . nitroGLYCERIN (NITROSTAT) 0.4 MG SL tablet Place 1 tablet (0.4 mg total) under the tongue every 5 (five) minutes as needed for chest pain. Reported on 01/05/2016 25 tablet 6  . ondansetron (ZOFRAN-ODT) 4 MG disintegrating tablet Take 4 mg by mouth every 8 (eight) hours as needed for nausea or vomiting.     [name]  PARoxetine Mesylate (BRISDELLE) 7.5 MG CAPS Take 7.5 mg by mouth daily.    . Potassium Chloride ER 20 MEQ TBCR Take 1 tablet by mouth 3 (three) times daily. (Patient taking differently: Take 1.5 tablets by mouth 2 (two) times daily. ) 90 tablet 3  . predniSONE (DELTASONE) 5 MG tablet Take 5 mg by mouth daily with breakfast.    . sulfamethoxazole-trimethoprim (BACTRIM DS) 800-160 MG tablet Take 1 tablet by mouth 2 (two) times daily. 14 tablet 0  . telmisartan (MICARDIS) 20 MG tablet Take 1 tablet (20 mg total) by mouth daily. 30 tablet 1  . triamcinolone cream (KENALOG) 0.1 % Apply 1 application topically daily as needed (for irritation).     . venlafaxine XR (EFFEXOR-XR) 75 MG 24 hr capsule Take 1 capsule (75 mg total) by mouth 2 (two) times daily.    [name] amoxicillin-clavulanate (AUGMENTIN) 875-125 MG tablet Take 1 tablet by mouth 2 (two) times daily for 7 days. 14 tablet 0  . guaiFENesin-codeine 100-10 MG/5ML syrup Take 5 mLs by mouth every 6 (six) hours as needed for cough. 120 mL 0  . predniSONE (DELTASONE) 20 MG tablet Take 2 pills for 3 days, 1 pill for 4 days 10 tablet 0   No current facility-administered medications for this visit.     Objective: BP (!) 130/56 (BP Location: Left Arm, Patient Position: Sitting, Cuff Size: Large)   Pulse 84   Temp 98.2 F (36.8 C) (Oral)   Ht 5' 8"  (1.727 m)   Wt 240 lb 6.4 oz (109 kg)   SpO2 95%   BMI  36.55 kg/m  Gen: NAD, resting comfortably HEENT: Turbinates erythematous with yellow/green drainage, TM normal, pharynx mildly erythematous with no tonsilar exudate or edema, maxillary sinus tenderness CV: RRR no murmurs rubs or gallops Lungs: occasional diffuse wheeze but no crackles, rhonchi Abdomen: soft/nontender/nondistended/normal bowel sounds.  Ext: no edema Skin: warm, dry, no rash  Assessment/Plan:  Sinsusitis- likely viral but in patient who typically quickly moves to bacterial infections that lead to hospitalization and usually involve the lungs.  Underlying sarcoid noted as well as obstructive chronic bronchitis as per pulmonary-With wheezing and shortness of breath may have early bronchitis. From avs  "Start prednisone tonight- takes about 24 hours to kick in.   If no better within 48-72 hours or if symptoms worsen such as fever or worsening shortness of breath start augmentin.   Codeine cough syrup printed today"  Poor control of last a1c noted as well- would consider holding off on steroids in future if a1c above 9.   Finally, we reviewed reasons to return to care including if symptoms worsen or persist or new concerns arise (particularly fever or worsening shortness of breath)  Meds ordered this encounter  Medications  . predniSONE (DELTASONE) 20 MG tablet    Sig: Take 2 pills for 3 days, 1 pill for 4 days    Dispense:  10 tablet    Refill:  0  . amoxicillin-clavulanate (AUGMENTIN) 875-125 MG tablet    Sig: Take 1 tablet by mouth 2 (two) times daily for 7 days.    Dispense:  14 tablet    Refill:  0  . guaiFENesin-codeine 100-10 MG/5ML syrup    Sig: Take 5 mLs by mouth every 6 (six) hours as needed for cough.    Dispense:  120 mL    Refill:  0    Garret Reddish, MD

## 2018-01-24 NOTE — Patient Instructions (Signed)
Start prednisone tonight- takes about 24 hours to kick in.   If no better within 48-72 hours or if symptoms worsen such as fever or worsening shortness of breath start augmentin.   Codeine cough syrup printed today

## 2018-01-25 ENCOUNTER — Encounter: Payer: Self-pay | Admitting: Family Medicine

## 2018-01-25 ENCOUNTER — Other Ambulatory Visit: Payer: Self-pay | Admitting: Cardiology

## 2018-01-26 NOTE — Telephone Encounter (Signed)
REFILL 

## 2018-01-27 ENCOUNTER — Ambulatory Visit: Payer: BLUE CROSS/BLUE SHIELD | Admitting: Endocrinology

## 2018-02-02 NOTE — Telephone Encounter (Signed)
Dexilant ordered Will sign off

## 2018-02-08 ENCOUNTER — Ambulatory Visit (INDEPENDENT_AMBULATORY_CARE_PROVIDER_SITE_OTHER): Payer: BLUE CROSS/BLUE SHIELD | Admitting: Family Medicine

## 2018-02-08 ENCOUNTER — Encounter: Payer: Self-pay | Admitting: Family Medicine

## 2018-02-08 VITALS — BP 134/66 | HR 81 | Temp 98.7°F | Ht 68.0 in | Wt 242.8 lb

## 2018-02-08 DIAGNOSIS — D508 Other iron deficiency anemias: Secondary | ICD-10-CM | POA: Diagnosis not present

## 2018-02-08 DIAGNOSIS — R140 Abdominal distension (gaseous): Secondary | ICD-10-CM | POA: Diagnosis not present

## 2018-02-08 DIAGNOSIS — N183 Chronic kidney disease, stage 3 unspecified: Secondary | ICD-10-CM

## 2018-02-08 DIAGNOSIS — K760 Fatty (change of) liver, not elsewhere classified: Secondary | ICD-10-CM

## 2018-02-08 DIAGNOSIS — R1013 Epigastric pain: Secondary | ICD-10-CM

## 2018-02-08 MED ORDER — HYDROCOD POLST-CPM POLST ER 10-8 MG/5ML PO SUER: 5.0000 mL | Freq: Every evening | ORAL | 0 refills | Status: DC | PRN

## 2018-02-08 NOTE — Progress Notes (Addendum)
[name] is a 57 y.o. female is here for an acute visit.  History of Present Illness:   Shaune Pascal CMA acting as scribe for Dr. Juleen China.  CC: Patient comes in today for abdominal cramps. Patient states that she has had this for about three weeks. When she eats it is worse. Patient stated that she could not get in with her GI doctor until the end of May.   HPI:   [name] is a 57 y.o. female with multiple significant medical problems as listed below who presents today with a two-week history of postprandial lower abdominal cramping pain and upper abdominal and substernal burning.   History of similar symptoms.  Previously treated for abdominal spasms and Candida esophagitis.  More recent history for colitis as well.  She has had prior fundoplication with normal postop EGD. Screening colonoscopy up-to-date. Prior cholecystectomy and hysterectomy.  Review of Systems  Constitutional: Positive for malaise/fatigue. Negative for chills, fever and weight loss.  Respiratory: Negative for cough.   Cardiovascular: Negative for chest pain, palpitations and leg swelling.  Gastrointestinal: Positive for abdominal pain, heartburn and nausea. Negative for blood in stool, constipation, diarrhea, melena and vomiting.  Genitourinary: Negative for dysuria, flank pain and hematuria.  Musculoskeletal: Negative for back pain.  Neurological: Negative for dizziness.   Depression screen [facility] 2/9 06/16/2017 01/26/2017 10/27/2015  Decreased Interest 0 0 0  Down, Depressed, Hopeless 0 0 0  PHQ - 2 Score 0 0 0  Some recent data might be hidden   PMHx, SurgHx, SocialHx, FamHx, Medications, and Allergies were reviewed in the Visit Navigator and updated as appropriate.   Patient Active Problem List   Diagnosis Date Noted  . Total knee replacement status, left 12/07/2017  . Unilateral primary osteoarthritis, left knee 11/07/2017  . Dysphagia 09/20/2017  . Epiglottitis   . Plantar fasciitis,  right 07/13/2017  . S/P total knee arthroplasty, right 06/29/2017  . Osteoarthrosis, localized, primary, knee, right   . Sprain of calcaneofibular ligament of right ankle 06/06/2017  . Osteoarthritis of right knee 01/26/2017  . Multiple allergies 10/14/2016  . Onychomycosis 10/27/2015  . Osteomyelitis of left foot ([place]) 05/29/2015  . CKD (chronic kidney disease) stage 3, GFR 30-59 ml/min (HCC) 04/27/2015  . MRSA (methicillin resistant staph aureus) culture positive 03/27/2015  . Wound infection complicating hardware ([place]) 03/27/2015  . Fatty liver 09/30/2014  . Hot flashes 07/15/2014  . Abnormal SPEP 04/17/2014  . Fracture of left leg 04/17/2014  . Cushingoid side effect of steroids ([place]) 04/17/2014  . Internal hemorrhoids   . Depression   . Preoperative clearance 03/25/2014  . Obstructive chronic bronchitis without exacerbation ([place]) 09/18/2013  . Chest pain 04/11/2013  . Hypertensive heart disease with chronic diastolic congestive heart failure ([place])   . Solitary pulmonary nodule, on CT 02/2013 - stable over 2 years in 2015 02/20/2013  . Gout 08/20/2010  . Anemia 09/18/2009  . Morbid obesity ([place]) 06/04/2009  . Sleep apnea 04/21/2009  . Sarcoidosis of lung ([place]) 04/10/2007  . Hyperlipidemia 08/21/2006  . Essential hypertension 08/21/2006  . GERD 08/21/2006  . Type II diabetes mellitus with renal manifestations ([place]) 08/21/2006   Social History   Tobacco Use  . Smoking status: Never Smoker  . Smokeless tobacco: Never Used  Substance Use Topics  . Alcohol use: No    Alcohol/week: 0.0 oz  . Drug use: No   Current Medications and Allergies:   .  albuterol (PROVENTIL HFA;VENTOLIN HFA) 108 (90 Base) MCG/ACT  inhaler, Inhale 1-2 puffs into the lungs every 6 (six) hours as needed for wheezing or shortness of breath., Disp: 8 g, Rfl: 5 .  allopurinol (ZYLOPRIM) 100 MG tablet, Take 100 mg by mouth 2 (two) times daily. , Disp: , Rfl:  .  aspirin EC 81 MG tablet, Take 81 mg by  mouth daily. , Disp: , Rfl:  .  atorvastatin (LIPITOR) 40 MG tablet, TAKE 1 TAB BY MOUTH ONCE DAILY (Patient taking differently: Take 40 mg by mouth once a day), Disp: 90 tablet, Rfl: 3 .  BAYER MICROLET LANCETS lancets, Use as instructed to check sugar 3 times daily, Disp: 300 each, Rfl: 5 .  benzonatate (TESSALON) 200 MG capsule, Take 200 mg by mouth 3 (three) times daily as needed for cough. , Disp: , Rfl:  .  Blood Glucose Monitoring Suppl (BAYER CONTOUR MONITOR) w/Device KIT, Use daily to check sugar, Disp: 1 kit, Rfl: 0 .  buPROPion (WELLBUTRIN XL) 300 MG 24 hr tablet, Take 1 tablet (300 mg total) by mouth daily., Disp: 90 tablet, Rfl: 3 .  Calcium Citrate 200 MG TABS, Take 200 mg by mouth daily., Disp: , Rfl:  .  carvedilol (COREG) 25 MG tablet, Take 1 tablet (25 mg total) by mouth 2 (two) times daily with a meal., Disp: 180 tablet, Rfl: 3 .  Cholecalciferol (VITAMIN D) 2000 units tablet, Take 2,000 Units by mouth daily., Disp: , Rfl:  .  colestipol (COLESTID) 1 g tablet, Take 2 tablets (2 g total) by mouth 2 (two) times daily., Disp: 120 tablet, Rfl: 5 .  Cyanocobalamin (VITAMIN B-12 PO), Take 2,500 mcg by mouth daily. , Disp: , Rfl:  .  DEXILANT 60 MG capsule, TAKE 1 CAPSULE BY MOUTH EVERY DAY, Disp: 30 capsule, Rfl: 1 .  diclofenac sodium (VOLTAREN) 1 % GEL, APPLY 2 GRAMS TOPICALLY 4 TIMES A DAY (Patient taking differently: APPLY 2 GRAMS TOPICALLY 4 TIMES A DAY AS NEEDED FOR KNEE PAIN), Disp: 100 g, Rfl: 0 .  diltiazem (CARDIZEM CD) 120 MG 24 hr capsule, TAKE ONE CAPSULE BY MOUTH DAILY, Disp: 90 capsule, Rfl: 0 .  fenofibrate (TRICOR) 145 MG tablet, TAKE 1 TABLET BY MOUTH EVERY DAY, Disp: 30 tablet, Rfl: 11 .  fluticasone (FLONASE) 50 MCG/ACT nasal spray, Place 2 sprays into both nostrils daily. (Patient taking differently: Place 2 sprays into both nostrils daily as needed for allergies. ), Disp: 48 g, Rfl: 3 .  fluticasone furoate-vilanterol (BREO ELLIPTA) 200-25 MCG/INH AEPB, Inhale 1  puff into the lungs daily., Disp: , Rfl:  .  furosemide (LASIX) 40 MG tablet, Take 1 tablet (40 mg total) by mouth 2 (two) times daily., Disp: 180 tablet, Rfl: 1 .  gabapentin (NEURONTIN) 100 MG capsule, Take 1 capsule (100 mg total) by mouth 3 (three) times daily., Disp: 270 capsule, Rfl: 0 .  glucose blood (BAYER CONTOUR TEST) test strip, Use as instructed to check sugar 3 times daily, Disp: 300 each, Rfl: 5 .  guaiFENesin-codeine 100-10 MG/5ML syrup, Take 5 mLs by mouth every 6 (six) hours as needed for cough., Disp: 120 mL, Rfl: 0 .  HYDROcodone-acetaminophen (NORCO/VICODIN) 5-325 MG tablet, Take 1 tablet by mouth every 4 (four) hours as needed for moderate pain., Disp: 60 tablet, Rfl: 0 .  insulin aspart (NOVOLOG FLEXPEN) 100 UNIT/ML FlexPen, Inject 30-35 Units into the skin 3 (three) times daily with meals., Disp: 30 mL, Rfl: 5 .  Insulin Glargine (TOUJEO SOLOSTAR) 300 UNIT/ML SOPN, Inject 40 Units into the skin  at bedtime., Disp: 5 pen, Rfl: 11 .  Insulin Pen Needle 33G X 4 MM MISC, 1 each by Does not apply route 4 (four) times daily., Disp: 200 each, Rfl: 11 .  LORazepam (ATIVAN) 0.5 MG tablet, Take 0.5 mg by mouth every 12 (twelve) hours as needed for anxiety., Disp: , Rfl:  .  metFORMIN (GLUCOPHAGE) 1000 MG tablet, TAKE 1 TABLET BY MOUTH TWICE A DAY WITH A MEAL, Disp: 180 tablet, Rfl: 0 .  nitroGLYCERIN (NITROSTAT) 0.4 MG SL tablet, Place 1 tablet (0.4 mg total) under the tongue every 5 (five) minutes as needed for chest pain. Reported on 01/05/2016, Disp: 25 tablet, Rfl: 6 .  ondansetron (ZOFRAN-ODT) 4 MG disintegrating tablet, Take 4 mg by mouth every 8 (eight) hours as needed for nausea or vomiting. , Disp: , Rfl:  .  PARoxetine Mesylate (BRISDELLE) 7.5 MG CAPS, Take 7.5 mg by mouth daily., Disp: , Rfl:  .  Potassium Chloride ER 20 MEQ TBCR, Take 1 tablet by mouth 3 (three) times daily. (Patient taking differently: Take 1.5 tablets by mouth 2 (two) times daily. ), Disp: 90 tablet, Rfl: 3 .   predniSONE (DELTASONE) 5 MG tablet, Take 5 mg by mouth daily with breakfast., Disp: , Rfl:  .  telmisartan (MICARDIS) 20 MG tablet, Take 1 tablet (20 mg total) by mouth daily., Disp: 30 tablet, Rfl: 1 .  triamcinolone cream (KENALOG) 0.1 %, Apply 1 application topically daily as needed (for irritation). , Disp: , Rfl:  .  venlafaxine XR (EFFEXOR-XR) 75 MG 24 hr capsule, Take 1 capsule (75 mg total) by mouth 2 (two) times daily., Disp: , Rfl:    Allergies  Allergen Reactions  . Methotrexate Other (See Comments)    Peri-oral and buccal lesions  . Vancomycin Other (See Comments)    DOSE RELATED NEPHROTOXICITY  . Lisinopril Cough  . Chlorhexidine Itching  . Clindamycin/Lincomycin Nausea And Vomiting and Rash    RASH > ALLERGY INTOLERANCE > NAUSEA & VOMITING  . Doxycycline Rash  . Teflaro [Ceftaroline] Rash    Tolerates ceftriaxone    Review of Systems   Pertinent items are noted in the HPI. Otherwise, ROS is negative.  Vitals:   Vitals:   02/08/18 1442  BP: 134/66  Pulse: 81  Temp: 98.7 F (37.1 C)  TempSrc: Oral  SpO2: 96%  Weight: 242 lb 12.8 oz (110.1 kg)  Height: 5' 8"  (1.727 m)     Body mass index is 36.92 kg/m.   Physical Exam:   Physical Exam  Constitutional: She is oriented to person, place, and time. She appears well-developed and well-nourished. No distress.  HENT:  Head: Normocephalic and atraumatic.  Right Ear: External ear normal.  Left Ear: External ear normal.  Nose: Nose normal.  Mouth/Throat: Oropharynx is clear and moist.  Eyes: Pupils are equal, round, and reactive to light. Conjunctivae and EOM are normal.  Neck: Normal range of motion. Neck supple. No thyromegaly present.  Cardiovascular: Normal rate, regular rhythm, normal heart sounds and intact distal pulses.  Pulmonary/Chest: Effort normal and breath sounds normal.  Abdominal: Soft. She exhibits distension. There is tenderness in the right upper quadrant and epigastric area. There is no  rigidity and no guarding.  Musculoskeletal: Normal range of motion.  Lymphadenopathy:    She has no cervical adenopathy.  Neurological: She is alert and oriented to person, place, and time.  Skin: Skin is warm and dry. Capillary refill takes less than 2 seconds.  Psychiatric: She has a normal  mood and affect. Her behavior is normal.  Nursing note and vitals reviewed.   Lab Results  Component Value Date   ALT 20 02/08/2018   AST 24 02/08/2018   ALKPHOS 72 02/08/2018   BILITOT 0.6 02/08/2018   Assessment and Plan:   Patient presents with intermittent abdominal pain and a history of multiple abdominal surgeries.  She is diabetic with chronic kidney disease and known fatty liver disease.  I am concerned today enough to obtain a CT scan.  Labs are pending.  See below.  Concern for peptic ulcer disease, liver changes resulting in abdominal fluid, colitis, pseudoobstruction, or even ischemic changes.  GI was called and happy to work the patient in on Friday which we very much appreciate.    Diagnoses and all orders for this visit:  Epigastric pain -     H. pylori antibody, IgG -     Lipase -     CBC -     Comp Met (CMET) -     CT Abdomen Pelvis W Contrast -     chlorpheniramine-HYDROcodone (TUSSIONEX PENNKINETIC ER) 10-8 MG/5ML SUER; Take 5 mLs by mouth at bedtime as needed for cough.  Abdominal distension (gaseous) -     H. pylori antibody, IgG -     Lipase -     CBC -     Comp Met (CMET) -     CT Abdomen Pelvis W Contrast  CKD (chronic kidney disease) stage 3, GFR 30-59 ml/min (HCC)  Fatty liver  Other iron deficiency anemia   . Reviewed expectations re: course of current medical issues. . Discussed self-management of symptoms. . Outlined signs and symptoms indicating need for more acute intervention. . Patient verbalized understanding and all questions were answered. [name] Health Maintenance issues including appropriate healthy diet, exercise, and smoking avoidance were  discussed with patient. . See orders for this visit as documented in the electronic medical record. . Patient received an After Visit Summary.  Briscoe Deutscher, DO [place] [place] 02/11/2018  Future Appointments  Date Time Provider [place]  02/20/2018 10:45 AM [name], MD [facility]  02/23/2018 10:45 AM Renato Shin, MD [facility] None

## 2018-02-09 ENCOUNTER — Ambulatory Visit (INDEPENDENT_AMBULATORY_CARE_PROVIDER_SITE_OTHER)
Admission: RE | Admit: 2018-02-09 | Discharge: 2018-02-09 | Disposition: A | Discharge status: Home / Self Care | Payer: BLUE CROSS/BLUE SHIELD | Source: Ambulatory Visit | Attending: Family Medicine | Admitting: Family Medicine

## 2018-02-09 DIAGNOSIS — R109 Unspecified abdominal pain: Secondary | ICD-10-CM | POA: Diagnosis not present

## 2018-02-09 DIAGNOSIS — R1013 Epigastric pain: Secondary | ICD-10-CM | POA: Diagnosis not present

## 2018-02-09 LAB — COMPREHENSIVE METABOLIC PANEL
ALT: 20 U/L (ref 0–35)
AST: 24 U/L (ref 0–37)
Albumin: 4.2 g/dL (ref 3.5–5.2)
Alkaline Phosphatase: 72 U/L (ref 39–117)
BUN: 21 mg/dL (ref 6–23)
CO2: 27 mEq/L (ref 19–32)
Calcium: 9.3 mg/dL (ref 8.4–10.5)
Chloride: 96 mEq/L (ref 96–112)
Creatinine, Ser: 1.06 mg/dL (ref 0.40–1.20)
GFR: 56.49 mL/min — ABNORMAL LOW (ref >60.00)
Glucose, Bld: 233 mg/dL — ABNORMAL HIGH (ref 70–99)
Potassium: 4.4 mEq/L (ref 3.5–5.1)
Sodium: 137 mEq/L (ref 135–145)
Total Bilirubin: 0.6 mg/dL (ref 0.2–1.2)
Total Protein: 6.9 g/dL (ref 6.0–8.3)

## 2018-02-09 LAB — CBC
HCT: 30.6 % — ABNORMAL LOW (ref 36.0–46.0)
Hemoglobin: 10 g/dL — ABNORMAL LOW (ref 12.0–15.0)
MCHC: 32.6 g/dL (ref 30.0–36.0)
MCV: 82.8 fl (ref 78.0–100.0)
Platelets: 352 10*3/uL (ref 150.0–400.0)
RBC: 3.69 Mil/uL — ABNORMAL LOW (ref 3.87–5.11)
RDW: 15.3 % (ref 11.5–15.5)
WBC: 14.2 10*3/uL — ABNORMAL HIGH (ref 4.0–10.5)

## 2018-02-09 LAB — H. PYLORI ANTIBODY, IGG: H Pylori IgG: NEGATIVE

## 2018-02-09 LAB — LIPASE: Lipase: 27 U/L (ref 11.0–59.0)

## 2018-02-09 MED ORDER — IOPAMIDOL (ISOVUE-300) INJECTION 61%
100.0000 mL | Freq: Once | INTRAVENOUS | Status: AC | PRN
  Administered 2018-02-09: 100 mL via INTRAVENOUS

## 2018-02-10 ENCOUNTER — Encounter: Payer: Self-pay | Admitting: Internal Medicine

## 2018-02-10 ENCOUNTER — Ambulatory Visit (INDEPENDENT_AMBULATORY_CARE_PROVIDER_SITE_OTHER): Payer: BLUE CROSS/BLUE SHIELD | Admitting: Internal Medicine

## 2018-02-10 ENCOUNTER — Telehealth: Payer: Self-pay | Admitting: Family Medicine

## 2018-02-10 VITALS — BP 128/60 | HR 72 | Ht 66.0 in | Wt 242.4 lb

## 2018-02-10 DIAGNOSIS — R109 Unspecified abdominal pain: Secondary | ICD-10-CM | POA: Diagnosis not present

## 2018-02-10 DIAGNOSIS — K219 Gastro-esophageal reflux disease without esophagitis: Secondary | ICD-10-CM | POA: Diagnosis not present

## 2018-02-10 MED ORDER — DICYCLOMINE HCL 20 MG PO TABS: ORAL_TABLET | ORAL | 1 refills | Status: DC

## 2018-02-10 NOTE — Progress Notes (Signed)
HISTORY OF PRESENT ILLNESS:  [name] is a 57 y.o. female with multiple significant medical problems as listed below who presents today with a two-week history of postprandial lower abdominal cramping pain and upper abdominal and substernal "burning". Similar problems February 2017. See that dictation. Felt to have abdominal spasm and possible candida esophagitis as she was noted to have oral thrush. Prescribed Diflucan and Levsin. Current complaint is that of postprandial lower abdominal cramping discomfort which last one to 2 minutes. No nocturnal features. No weight loss. She is accompanied by her mother-in-law. Nonspecific burning associated with meals. Daily bowel movements. She saw her PCP 2 days ago. Comprehensive metabolic panel was unremarkable except for elevated glucose. Normal CBC except hemoglobin 10.0. She has had prior fundoplication with normal postop EGD. Screening colonoscopy up-to-date. She also underwent abdominal films and CT scan of the abdomen and pelvis yesterday. CT scan reveals increased feces in the colon. Hepatic steatosis. No other abnormalities except for moderate abdominal aortic atherosclerosis. No comments about the patency of the mesenteric vasculature. She denies bleeding or fevers  REVIEW OF SYSTEMS:  All non-GI ROS negative unless otherwise stated in the history of present illness except for cough, back pain, exertional shortness of breath, ankle swelling  Past Medical History:  Diagnosis Date  . Abnormal SPEP 04/17/2014  . Acute left ankle pain 01/26/2017  . ANEMIA-UNSPECIFIED 09/18/2009  . Anxiety   . Arthritis   . Chronic diastolic heart failure, NYHA class 2 (HCC)    Normal LVEDP by May 2018  . COPD (chronic obstructive pulmonary disease) ([place])   . Depression   . DIABETES MELLITUS, TYPE II 08/21/2006  . Diabetic osteomyelitis ([place]) 05/29/2015  . Fracture of 5th metatarsal    non union  . GERD 08/21/2006  . GOUT 08/20/2010  . Hx of umbilical hernia  repair   . HYPERLIPIDEMIA 08/21/2006  . HYPERTENSION 08/21/2006  . Infection of wound due to methicillin resistant Staphylococcus aureus (MRSA)   . Internal hemorrhoids   . Multiple allergies 10/14/2016  . OBESITY 06/04/2009  . Onychomycosis 10/27/2015  . Osteomyelitis of left foot ([place]) 05/29/2015  . Pulmonary sarcoidosis ([place])    Followed locally by pulmonology, but also by Dr. Casper Harrison at [facility]  . Right knee pain 01/26/2017  . Vocal cord dysfunction   . Wears partial dentures     Past Surgical History:  Procedure Laterality Date  . ABDOMINAL HYSTERECTOMY    . APPENDECTOMY    . BLADDER SUSPENSION  11/11/2011   Procedure: TRANSVAGINAL TAPE (TVT) PROCEDURE;  Surgeon: Olga Millers, MD;  Location: [facility];  Service: Gynecology;  Laterality: N/A;  . CAROTIDS  02/18/11   CAROTID DUPLEX; VERTEBRALS ARE PATENT WITH ANTEGRADE FLOW. ICA/CCA RATIO 1.61 ON RIGHT AND 0.75 ON LEFT  . CHOLECYSTECTOMY  1984  . CYSTOSCOPY  11/11/2011   Procedure: CYSTOSCOPY;  Surgeon: Olga Millers, MD;  Location: [facility];  Service: Gynecology;  Laterality: N/A;  . EXTUBATION (ENDOTRACHEAL) IN OR N/A 09/23/2017   Procedure: EXTUBATION (ENDOTRACHEAL) IN OR;  Surgeon: Helayne Seminole, MD;  Location: [facility];  Service: ENT;  Laterality: N/A;  . FIBEROPTIC LARYNGOSCOPY AND TRACHEOSCOPY N/A 09/23/2017   Procedure: FLEXIBLE FIBEROPTIC LARYNGOSCOPY;  Surgeon: Helayne Seminole, MD;  Location: [facility];  Service: ENT;  Laterality: N/A;  . FRACTURE SURGERY     foot  . HERNIA REPAIR    . I&D EXTREMITY Left 06/27/2015   Procedure: Partial Excision Left Calcaneus, Place Antibiotic Beads, and Wound  VAC;  Surgeon: Newt Minion, MD;  Location: [place];  Service: Orthopedics;  Laterality: Left;  . KNEE ARTHROSCOPY     right  . LEFT AND RIGHT HEART CATHETERIZATION WITH CORONARY ANGIOGRAM N/A 04/23/2013   Procedure: LEFT AND RIGHT HEART CATHETERIZATION WITH CORONARY ANGIOGRAM;  Surgeon: Leonie Man, MD;   Location: [facility];  Service: Cardiovascular;  Laterality: N/A;  . LEFT HEART CATH AND CORONARY ANGIOGRAPHY N/A 03/11/2017   Procedure: Left Heart Cath and Coronary Angiography;  Surgeon: Sherren Mocha, MD;  Location: [facility] CV LAB; angiographically minimal CAD in the LAD otherwise normal.;  Normal LVEDP.  FALSE POSITIVE MYOVIEW  . LEFT HEART CATH AND CORONARY ANGIOGRAPHY  07/20/2010   LVEF 50-55% WITH VERY MILD GLOBAL HYPOKINESIA; ESSENTIALLY NORMAL CORONARY ARTERIES; NORMAL LV FUNCTION  . METATARSAL OSTEOTOMY WITH OPEN REDUCTION INTERNAL FIXATION (ORIF) METATARSAL WITH FUSION Left 04/09/2014   Procedure: LEFT FOOT FRACTURE OPEN TREATMENT METATARSAL INCLUDES INTERNAL FIXATION EACH;  Surgeon: Lorn Junes, MD;  Location: [facility];  Service: Orthopedics;  Laterality: Left;  . NISSEN FUNDOPLICATION  4193  . NM MYOVIEW LTD  03/09/2013   Lexiscan --> EF 50%; NORMAL MYOCARDIAL PERFUSION STUDY - breast attenuation  . NM MYOVIEW LTD  03/10/2017   : Moderate size "stress-induced "perfusion defect at the apex as well as "ill-defined stress-induced perfusion defect in the lateral wall.  EF 55%.  INTERMEDIATE risk. -->  FALSE POSITIVE  . Right and left CARDIAC CATHETERIZATION  04/23/2013   Angiographic normal coronaries; LVEDP 20 mmHg, PCWP 12-14 mmHg, RAP 12 mmHg.; Fick CO/CI 4.9/2.2  . TOTAL KNEE ARTHROPLASTY Right 06/29/2017   Procedure: RIGHT TOTAL KNEE ARTHROPLASTY;  Surgeon: Newt Minion, MD;  Location: [facility];  Service: Orthopedics;  Laterality: Right;  . TOTAL KNEE ARTHROPLASTY Left 12/07/2017  . TOTAL KNEE ARTHROPLASTY Left 12/07/2017   Procedure: LEFT TOTAL KNEE ARTHROPLASTY;  Surgeon: Newt Minion, MD;  Location: [place];  Service: Orthopedics;  Laterality: Left;  . TRACHEOSTOMY TUBE PLACEMENT N/A 09/20/2017   Procedure: AWAKE INTUBATION WITH ANESTHESIA WITH VIDEO ASSISTANCE;  Surgeon: Helayne Seminole, MD;  Location: [facility];  Service: ENT;  Laterality: N/A;  .  TRANSTHORACIC ECHOCARDIOGRAM  08/2014   Normal LV size and function.  Mild LVH.  EF 55-60%.  Normal regional wall motion.  GR 1 DD.  Normal RV size and function .  [name] TUBAL LIGATION     with reversal in 1994  . VENTRAL HERNIA REPAIR      Social History LINSIE LUPO  reports that she has never smoked. She has never used smokeless tobacco. She reports that she does not drink alcohol or use drugs.  family history includes Asthma in her mother; Breast cancer in her mother; COPD in her mother; Coronary artery disease in her father; Diabetes in her brother and father; Emphysema in her mother; Heart attack in her maternal grandfather; Heart attack (age of onset: 59) in her father; Heart failure in her father and mother; Lung cancer in her brother; Sarcoidosis in her maternal uncle.  Allergies  Allergen Reactions  . Methotrexate Other (See Comments)    Peri-oral and buccal lesions  . Vancomycin Other (See Comments)    DOSE RELATED NEPHROTOXICITY  . Lisinopril Cough  . Chlorhexidine Itching  . Clindamycin/Lincomycin Nausea And Vomiting and Rash    RASH > ALLERGY INTOLERANCE > NAUSEA & VOMITING  . Doxycycline Rash  . Teflaro [Ceftaroline] Rash    Tolerates ceftriaxone  PHYSICAL EXAMINATION: Vital signs: BP 128/60   Pulse 72   Ht 5\' 6"  (1.676 m)   Wt 242 lb 6 oz (109.9 kg)   BMI 39.12 kg/m   Constitutional: pleasant,generally well-appearing, no acute distress Psychiatric: alert and oriented x3, cooperative Eyes: extraocular movements intact, anicteric, conjunctiva pink Mouth: oral pharynx moist, no lesionsno thrush Neck: supple no lymphadenopathy Cardiovascular: heart regular rate and rhythm, no murmur Lungs: clear to auscultation bilaterally Abdomen: soft, obese, nontender, nondistended, no obvious ascites, no peritoneal signs, normal bowel sounds, no organomegaly Rectal:omitted Extremities: no clubbing, cyanosis, or lower extremity edema bilaterally. Scars on the knees  bilaterally from surgery Skin: no lesions on visible extremities Neuro: No focal deficits. Cranial nerves intact  ASSESSMENT:  #1. Postprandial abdominal cramping discomfort. Most likely spasm. Rule out mesenteric ischemia #2. Nonspecific burning. No evidence of thrush. #3. Multiple medical problems   PLAN:  #1. Prescribe Bentyl 20 mg before meals #2. Review CT with radiology to assess patency of mesenteric vasculature #3. I asked the patient to contact the office in a week or 2 to give [place] an update on her progress. If Burning persists, could consider an empiric trial of Diflucan as before.

## 2018-02-10 NOTE — Telephone Encounter (Signed)
Copied from [place] [id number]. Topic: Quick Communication - Lab Results >> Feb 10, 2018  3:44 PM Tye [place] wrote: Call pt when labs and images have been released

## 2018-02-10 NOTE — Patient Instructions (Signed)
We have sent the following medications to your pharmacy for you to pick up at your convenience:  Bentyl  Call us in a few weeks with an update

## 2018-02-10 NOTE — Telephone Encounter (Signed)
Labs have not been released yet. She will receive a call when they are released

## 2018-02-11 ENCOUNTER — Encounter: Payer: Self-pay | Admitting: Family Medicine

## 2018-02-13 ENCOUNTER — Telehealth: Payer: Self-pay | Admitting: Family Medicine

## 2018-02-13 NOTE — Telephone Encounter (Signed)
Copied from [place] [id number]. Topic: Quick Communication - Lab Results >> Feb 13, 2018  1:33 PM Hewitt Shorts wrote: Pt had CT scan done Thursday and then lab work on Wednesday and she would like the results  Best number [phone]

## 2018-02-14 NOTE — Telephone Encounter (Signed)
Dr. Juleen China thanks for seeing her- I think these results are in your inbasket to be interpreted so forwarding to you.

## 2018-02-15 NOTE — Telephone Encounter (Signed)
Apologize to the patient for me. I thought that the GI doctor spoke with her about the CT. The CT was basically normal. No colitis. No obstruction. Some fatty liver. Some constipation. Make sure to follow up with your GI doctor to continue work-up and treatment.

## 2018-02-15 NOTE — Telephone Encounter (Signed)
Notified patient of message. Patient will schedule with GI.

## 2018-02-19 ENCOUNTER — Other Ambulatory Visit: Payer: Self-pay | Admitting: Physician Assistant

## 2018-02-20 ENCOUNTER — Ambulatory Visit (INDEPENDENT_AMBULATORY_CARE_PROVIDER_SITE_OTHER): Payer: BLUE CROSS/BLUE SHIELD | Admitting: Family Medicine

## 2018-02-20 ENCOUNTER — Encounter: Payer: Self-pay | Admitting: Family Medicine

## 2018-02-20 VITALS — BP 114/50 | HR 79 | Temp 98.6°F | Ht 66.0 in | Wt 244.4 lb

## 2018-02-20 DIAGNOSIS — Z23 Encounter for immunization: Secondary | ICD-10-CM

## 2018-02-20 DIAGNOSIS — K760 Fatty (change of) liver, not elsewhere classified: Secondary | ICD-10-CM

## 2018-02-20 DIAGNOSIS — E242 Drug-induced Cushing's syndrome: Secondary | ICD-10-CM

## 2018-02-20 DIAGNOSIS — F3342 Major depressive disorder, recurrent, in full remission: Secondary | ICD-10-CM

## 2018-02-20 DIAGNOSIS — E249 Cushing's syndrome, unspecified: Secondary | ICD-10-CM

## 2018-02-20 NOTE — Progress Notes (Signed)
Subjective:  [name] is a 57 y.o. year old very pleasant female patient who presents for/with See problem oriented charting ROS-no increased shortness of breath from baseline.  Admits to difficulty losing weight.  No chest pain.  Likely has not felt ill recently.  No increased edema  Past Medical History-  Patient Active Problem List   Diagnosis Date Noted  . Obstructive chronic bronchitis without exacerbation ([place]) 09/18/2013    Priority: High  . Chest pain 04/11/2013    Priority: High  . Hypertensive heart disease with chronic diastolic congestive heart failure ([place])     Priority: High  . Sarcoidosis of lung ([place]) 04/10/2007    Priority: High  . Type II diabetes mellitus with renal manifestations ([place]) 08/21/2006    Priority: High  . Epiglottitis     Priority: Medium  . Osteoarthritis of right knee 01/26/2017    Priority: Medium  . CKD (chronic kidney disease) stage 3, GFR 30-59 ml/min ([facility]) 04/27/2015    Priority: Medium  . Fatty liver 09/30/2014    Priority: Medium  . Depression     Priority: Medium  . Gout 08/20/2010    Priority: Medium  . Anemia 09/18/2009    Priority: Medium  . Sleep apnea 04/21/2009    Priority: Medium  . Hyperlipidemia 08/21/2006    Priority: Medium  . Essential hypertension 08/21/2006    Priority: Medium  . Dysphagia 09/20/2017    Priority: Low  . Plantar fasciitis, right 07/13/2017    Priority: Low  . Osteoarthrosis, localized, primary, knee, right     Priority: Low  . Sprain of calcaneofibular ligament of right ankle 06/06/2017    Priority: Low  . Onychomycosis 10/27/2015    Priority: Low  . MRSA (methicillin resistant staph aureus) culture positive 03/27/2015    Priority: Low  . Hot flashes 07/15/2014    Priority: Low  . Abnormal SPEP 04/17/2014    Priority: Low  . Fracture of left leg 04/17/2014    Priority: Low  . Cushingoid side effect of steroids ([place]) 04/17/2014    Priority: Low  . Internal hemorrhoids     Priority:  Low  . Preoperative clearance 03/25/2014    Priority: Low  . Solitary pulmonary nodule, on CT 02/2013 - stable over 2 years in 2015 02/20/2013    Priority: Low  . Morbid obesity ([place]) 06/04/2009    Priority: Low  . GERD 08/21/2006    Priority: Low  . Total knee replacement status, left 12/07/2017  . Unilateral primary osteoarthritis, left knee 11/07/2017  . S/P total knee arthroplasty, right 06/29/2017  . Multiple allergies 10/14/2016  . Osteomyelitis of left foot ([place]) 05/29/2015  . Wound infection complicating hardware ([place]) 03/27/2015    Medications- reviewed and updated Current Outpatient Medications  Medication Sig Dispense Refill  . albuterol (PROVENTIL HFA;VENTOLIN HFA) 108 (90 Base) MCG/ACT inhaler Inhale 1-2 puffs into the lungs every 6 (six) hours as needed for wheezing or shortness of breath. 8 g 5  . allopurinol (ZYLOPRIM) 100 MG tablet Take 100 mg by mouth 2 (two) times daily.     [name] aspirin EC 81 MG tablet Take 81 mg by mouth daily.     [name] atorvastatin (LIPITOR) 40 MG tablet TAKE 1 TAB BY MOUTH ONCE DAILY (Patient taking differently: Take 40 mg by mouth once a day) 90 tablet 3  . BAYER MICROLET LANCETS lancets Use as instructed to check sugar 3 times daily 300 each 5  . benzonatate (TESSALON) 200 MG capsule Take  200 mg by mouth 3 (three) times daily as needed for cough.     [name] buPROPion (WELLBUTRIN XL) 300 MG 24 hr tablet Take 1 tablet (300 mg total) by mouth daily. 90 tablet 3  . Calcium Citrate 200 MG TABS Take 200 mg by mouth daily.    . carvedilol (COREG) 25 MG tablet Take 1 tablet (25 mg total) by mouth 2 (two) times daily with a meal. 180 tablet 3  . Cholecalciferol (VITAMIN D) 2000 units tablet Take 2,000 Units by mouth daily.    . colestipol (COLESTID) 1 g tablet Take 2 tablets (2 g total) by mouth 2 (two) times daily. 120 tablet 5  . Cyanocobalamin (VITAMIN B-12 PO) Take 2,500 mcg by mouth daily.     [name] DEXILANT 60 MG capsule TAKE 1 CAPSULE BY MOUTH EVERY DAY  (Patient taking differently: TAKE 1 CAPSULE BY MOUTH bid) 30 capsule 1  . dicyclomine (BENTYL) 20 MG tablet Take one tablet 15 minutes before meals 90 tablet 1  . diltiazem (CARDIZEM CD) 120 MG 24 hr capsule TAKE ONE CAPSULE BY MOUTH DAILY 90 capsule 0  . fenofibrate (TRICOR) 145 MG tablet TAKE 1 TABLET BY MOUTH EVERY DAY 30 tablet 11  . fluticasone (FLONASE) 50 MCG/ACT nasal spray Place 2 sprays into both nostrils daily. (Patient taking differently: Place 2 sprays into both nostrils daily as needed for allergies. ) 48 g 3  . fluticasone furoate-vilanterol (BREO ELLIPTA) 200-25 MCG/INH AEPB Inhale 1 puff into the lungs daily.    . furosemide (LASIX) 40 MG tablet Take 1 tablet (40 mg total) by mouth 2 (two) times daily. 180 tablet 1  . gabapentin (NEURONTIN) 100 MG capsule Take 1 capsule (100 mg total) by mouth 3 (three) times daily. 270 capsule 0  . glucose blood (BAYER CONTOUR TEST) test strip Use as instructed to check sugar 3 times daily 300 each 5  . HYDROcodone-acetaminophen (NORCO/VICODIN) 5-325 MG tablet Take 1 tablet by mouth every 4 (four) hours as needed for moderate pain. 60 tablet 0  . insulin aspart (NOVOLOG FLEXPEN) 100 UNIT/ML FlexPen Inject 30-35 Units into the skin 3 (three) times daily with meals. 30 mL 5  . Insulin Glargine (TOUJEO SOLOSTAR) 300 UNIT/ML SOPN Inject 40 Units into the skin at bedtime. 5 pen 11  . Insulin Pen Needle 33G X 4 MM MISC 1 each by Does not apply route 4 (four) times daily. 200 each 11  . LORazepam (ATIVAN) 0.5 MG tablet Take 0.5 mg by mouth every 12 (twelve) hours as needed for anxiety.    . metFORMIN (GLUCOPHAGE) 1000 MG tablet TAKE 1 TABLET BY MOUTH TWICE A DAY WITH A MEAL 180 tablet 0  . nitroGLYCERIN (NITROSTAT) 0.4 MG SL tablet Place 1 tablet (0.4 mg total) under the tongue every 5 (five) minutes as needed for chest pain. Reported on 01/05/2016 25 tablet 6  . ondansetron (ZOFRAN-ODT) 4 MG disintegrating tablet Take 4 mg by mouth every 8 (eight) hours as  needed for nausea or vomiting.     [name] PARoxetine Mesylate (BRISDELLE) 7.5 MG CAPS Take 7.5 mg by mouth daily.    . Potassium Chloride ER 20 MEQ TBCR Take 1 tablet by mouth 3 (three) times daily. (Patient taking differently: Take 1.5 tablets by mouth 2 (two) times daily. ) 90 tablet 3  . predniSONE (DELTASONE) 5 MG tablet Take 5 mg by mouth daily with breakfast.    . telmisartan (MICARDIS) 20 MG tablet Take 1 tablet (20 mg total) by mouth  daily. 30 tablet 1  . triamcinolone cream (KENALOG) 0.1 % Apply 1 application topically daily as needed (for irritation).     . venlafaxine XR (EFFEXOR-XR) 75 MG 24 hr capsule Take 1 capsule (75 mg total) by mouth 2 (two) times daily.     No current facility-administered medications for this visit.     Objective: BP (!) 114/50 (BP Location: Left Arm, Patient Position: Sitting, Cuff Size: Large)   Pulse 79   Temp 98.6 F (37 C) (Oral)   Ht 5\' 6"  (1.676 m)   Wt 244 lb 6.4 oz (110.9 kg)   SpO2 96%   BMI 39.45 kg/m  Gen: NAD, resting comfortably [place] hump noted.   Supraclavicular fullness noted CV: RRR no rubs or gallops Lungs: CTAB nonlabored Abdomen: soft/nontender/nondistended/morbid obesity noted Skin: warm, dry  Assessment/Plan:  Other notes: 1. 08/12/18 charged no show. She was just a few minutes late and sent to ER due to reported shortness of breath. She was not a no show.  I gave this information to Carole Binning to have patient reimbursed.  2. still with some epigastric urning but abdominal pain is improving. Bentyl seems to be helpful. She saw her GI physician recently 3. Admits to some fatigue- no snoring, no cpap. Could repeat TSH - normal 1 year ago. Anemia stable around 10 for hgh  Depression S: Remains controlled on Wellbutrin 300mg . Effexor 75mg  BID. Also on  Paroxetine 7.5mg -hot flashes. A/P: Continue current medications.  PHQ 9 reasonable at 3 today.  No SI  Fatty liver S: Patient is concerned about hepatic steatosis noted on CT.   We discussed fatty liver.  She does not drink alcohol.  She does not believe she is immune to hepatitis A or hepatitis B.  her LFTs have been in normal range  A/P:We discussed importance of weight loss.  We also discussed her challenges  Including chronic prednisone and medical issues.  We will set her up with Twinrix at 0, 1, 6 months  Cushingoid side effect of steroids [place] hump noted.  Supraclavicular fullness noted. patient also has diabetes monitored by endocrine  Morbid obesity ([place]) S: BMI > 35 with HTN, diabetes, fatty liver, hyperlipidemia.   A/P: from avs "The best thing for fatty liver is healthy eating, exercise as able, weight loss. I would advise targetting even just 5 lbs off which isnt easy given your prednisone and medical issues. Lets shoot for this within 3 months or 6 months at latest. "   Future Appointments  Date Time Provider [place]  02/23/2018 10:45 AM Renato Shin, MD [facility] None  03/30/2018  9:00 AM [facility] NURSE [facility]  08/24/2018 10:00 AM [name], MD [facility]   3-37-month follow-up  Lab/Order associations: Need for prophylactic vaccination and inoculation against viral hepatitis - Plan: Hepatitis A hepatitis B combined vaccine IM  Return precautions advised.  Garret Reddish, MD

## 2018-02-20 NOTE — Assessment & Plan Note (Signed)
S: BMI > 35 with HTN, diabetes, fatty liver, hyperlipidemia.   A/P: from avs "The best thing for fatty liver is healthy eating, exercise as able, weight loss. I would advise targetting even just 5 lbs off which isnt easy given your prednisone and medical issues. Lets shoot for this within 3 months or 6 months at latest. "

## 2018-02-20 NOTE — Assessment & Plan Note (Signed)
S: Patient is concerned about hepatic steatosis noted on CT.  We discussed fatty liver.  She does not drink alcohol.  She does not believe she is immune to hepatitis A or hepatitis B.  her LFTs have been in normal range  A/P:We discussed importance of weight loss.  We also discussed her challenges  Including chronic prednisone and medical issues.  We will set her up with Twinrix at 0, 1, 6 months

## 2018-02-20 NOTE — Assessment & Plan Note (Signed)
[place] hump noted.  Supraclavicular fullness noted. patient also has diabetes monitored by endocrine

## 2018-02-20 NOTE — Assessment & Plan Note (Signed)
S: Remains controlled on Wellbutrin 300mg . Effexor 75mg  BID. Also on  Paroxetine 7.5mg -hot flashes. A/P: Continue current medications.  PHQ 9 reasonable at 3 today.  No SI

## 2018-02-20 NOTE — Patient Instructions (Signed)
Twinrix # 1 today. Schedule visit in 1 month and 6 months for last 2 immunizations. This is to provide coverage in case you ever had hepatitis A or B which could put a large strain on your liver given underlying fatty liver.   The best thing for fatty liver is healthy eating, exercise as able, weight loss. I would advise targetting even just 5 lbs off which isnt easy given your prednisone and medical issues. Lets shoot for this within 3 months or 6 months at latest.   See me 3-6 months  No other changes today- glad belly is doing better  Our team will do a phq9 before you go

## 2018-02-23 ENCOUNTER — Ambulatory Visit (INDEPENDENT_AMBULATORY_CARE_PROVIDER_SITE_OTHER): Payer: BLUE CROSS/BLUE SHIELD | Admitting: Family Medicine

## 2018-02-23 ENCOUNTER — Ambulatory Visit: Payer: BLUE CROSS/BLUE SHIELD | Admitting: Endocrinology

## 2018-02-23 ENCOUNTER — Encounter: Payer: Self-pay | Admitting: Family Medicine

## 2018-02-23 VITALS — BP 112/60 | HR 75 | Temp 98.4°F | Resp 16 | Ht 66.0 in | Wt 244.0 lb

## 2018-02-23 DIAGNOSIS — K5900 Constipation, unspecified: Secondary | ICD-10-CM | POA: Diagnosis not present

## 2018-02-23 DIAGNOSIS — J310 Chronic rhinitis: Secondary | ICD-10-CM

## 2018-02-23 DIAGNOSIS — H6983 Other specified disorders of Eustachian tube, bilateral: Secondary | ICD-10-CM

## 2018-02-23 DIAGNOSIS — R51 Headache: Secondary | ICD-10-CM | POA: Diagnosis not present

## 2018-02-23 DIAGNOSIS — R519 Headache, unspecified: Secondary | ICD-10-CM

## 2018-02-23 MED ORDER — IPRATROPIUM BROMIDE 0.06 % NA SOLN: 2.0000 | Freq: Four times a day (QID) | NASAL | 0 refills | Status: DC

## 2018-02-23 MED ORDER — SENNA 8.6 MG PO TABS: 1.0000 | ORAL_TABLET | Freq: Every day | ORAL | 0 refills | Status: DC | PRN

## 2018-02-23 MED ORDER — AMOXICILLIN-POT CLAVULANATE 875-125 MG PO TABS: 1.0000 | ORAL_TABLET | Freq: Two times a day (BID) | ORAL | 0 refills | Status: DC

## 2018-02-23 NOTE — Progress Notes (Signed)
   Subjective:  [name] is a 57 y.o. female who presents today for same-day appointment with a chief complaint of headache.   HPI:  Headache, acute problem Started two days ago. Stayed about the same over that time. Associated with some clear rhinorrhea and ear pressure.  Headache located along her frontal sinuses, sides of her head, and back of her head.  No nausea or vomiting.  No weakness or numbness.  No vision changes.  No reported speech difficulties.  Tried over-the-counter meds which have not helped.  Symptoms feel like a sinus infection.  Denies any fevers or chills.  No other obvious alleviating or aggravating factors.  Constipation, chronic problem Patient currently on MiraLAX and Colace daily.  Still having some difficulty with daily soft bowel movement.  No reported hematochezia or melena.  ROS: Per HPI  PMH: She reports that she has never smoked. She has never used smokeless tobacco. She reports that she does not drink alcohol or use drugs.  Objective:  Physical Exam: BP 112/60   Pulse 75   Temp 98.4 F (36.9 C)   Resp 16   Ht 5\' 6"  (1.676 m)   Wt 244 lb (110.7 kg)   SpO2 99%   BMI 39.38 kg/m   Gen: NAD, resting comfortably HEENT: TMs with clear effusion bilaterally.  Oropharynx erythematous without exudate.  Nasal mucosa erythematous and boggy bilaterally with white discharge. CV: RRR with no murmurs appreciated Pulm: NWOB, CTAB with no crackles, wheezes, or rhonchi Skin: Warm, dry Neuro: Cranial nerves II through XII intact.  Strength 5 out of 5 in upper and lower extremities. Psych: Normal affect and thought content  Assessment/Plan:  Headache/rhinitis/eustachian tube dysfunction No red flag signs or symptoms.  Likely secondary to sinusitis and eustachian tube dysfunction.  Will start Atrovent nasal spray to treat both of these.  Given his symptoms of only been present for 2 days, patient does not need antibiotics at this point.  Additionally, given her  history of chronic steroids and diabetes, we will avoid prednisone at this point.  I sent in a "pocket prescription" for Augmentin with strict instruction to not start unless symptoms worsen or fail to improve over the next several days.  Return precautions reviewed.  Follow-up as needed.  Constipation No red flag signs or symptoms.  Continue MiraLAX and Colace.  Advised patient that she could sparingly use senna as needed however should avoid overuse of this medication.  Algis Greenhouse. Jerline Pain, MD 02/23/2018 11:24 AM

## 2018-02-23 NOTE — Patient Instructions (Signed)
Start the atrovent.  Start the augmentin if your symptoms worsen or do not improve in a few days.  Please stay well hydrated.  Please let me know if your symptoms worsen or fail to improve.  Try taking senna for your bowel movements in addition to the miralax and colace.   Take care, Dr Jerline Pain

## 2018-02-24 ENCOUNTER — Other Ambulatory Visit: Payer: Self-pay | Admitting: Pulmonary Disease

## 2018-02-27 ENCOUNTER — Ambulatory Visit: Payer: BLUE CROSS/BLUE SHIELD | Admitting: Internal Medicine

## 2018-03-08 ENCOUNTER — Other Ambulatory Visit: Payer: Self-pay | Admitting: Cardiology

## 2018-03-08 NOTE — Telephone Encounter (Signed)
Rx sent to pharmacy   

## 2018-03-10 DIAGNOSIS — N3000 Acute cystitis without hematuria: Secondary | ICD-10-CM | POA: Diagnosis not present

## 2018-03-10 DIAGNOSIS — E119 Type 2 diabetes mellitus without complications: Secondary | ICD-10-CM | POA: Diagnosis not present

## 2018-03-10 DIAGNOSIS — Z794 Long term (current) use of insulin: Secondary | ICD-10-CM | POA: Diagnosis not present

## 2018-03-14 DIAGNOSIS — D860 Sarcoidosis of lung: Secondary | ICD-10-CM | POA: Diagnosis not present

## 2018-03-14 DIAGNOSIS — J9801 Acute bronchospasm: Secondary | ICD-10-CM | POA: Diagnosis not present

## 2018-03-14 DIAGNOSIS — R079 Chest pain, unspecified: Secondary | ICD-10-CM | POA: Diagnosis not present

## 2018-03-14 DIAGNOSIS — J4550 Severe persistent asthma, uncomplicated: Secondary | ICD-10-CM | POA: Diagnosis not present

## 2018-03-15 DIAGNOSIS — Z01419 Encounter for gynecological examination (general) (routine) without abnormal findings: Secondary | ICD-10-CM | POA: Diagnosis not present

## 2018-03-16 ENCOUNTER — Telehealth: Payer: Self-pay | Admitting: Family Medicine

## 2018-03-16 ENCOUNTER — Other Ambulatory Visit: Payer: Self-pay

## 2018-03-16 MED ORDER — POTASSIUM CHLORIDE ER 20 MEQ PO TBCR: 1.5000 | EXTENDED_RELEASE_TABLET | Freq: Two times a day (BID) | ORAL | 5 refills | Status: DC

## 2018-03-16 NOTE — Telephone Encounter (Signed)
See note

## 2018-03-16 NOTE — Telephone Encounter (Signed)
Called [name] to clarify as prescription ordered and medication prescribed timelines weren't matching up. Patient just needed new prescription sent in as there were no refills. New prescription sent to pharmacy

## 2018-03-16 NOTE — Telephone Encounter (Signed)
Copied from [place] [id number]. Topic: Quick Communication - See Telephone Encounter >> Mar 16, 2018 12:00 PM Ether Griffins B wrote: CRM for notification. See Telephone encounter for: 03/16/18.  Pt's husband is calling in regards to the Potassium Chloride ER 20 MEQ TBCR. She is somehow out of the medication and cant get a refill for 2 weeks. The pharmacy suggested calling and having Dr. Yong Channel call in another presription to get her through the next 2 weeks. She is going out of town this weekend for several weeks. Please [facility] to [name] [place] [address]

## 2018-03-18 ENCOUNTER — Other Ambulatory Visit: Payer: Self-pay | Admitting: Pulmonary Disease

## 2018-03-23 DIAGNOSIS — J069 Acute upper respiratory infection, unspecified: Secondary | ICD-10-CM | POA: Diagnosis not present

## 2018-03-23 DIAGNOSIS — J0100 Acute maxillary sinusitis, unspecified: Secondary | ICD-10-CM | POA: Diagnosis not present

## 2018-03-25 DIAGNOSIS — Z8614 Personal history of Methicillin resistant Staphylococcus aureus infection: Secondary | ICD-10-CM | POA: Diagnosis not present

## 2018-03-25 DIAGNOSIS — J3489 Other specified disorders of nose and nasal sinuses: Secondary | ICD-10-CM | POA: Diagnosis not present

## 2018-03-26 ENCOUNTER — Other Ambulatory Visit: Payer: Self-pay | Admitting: Cardiology

## 2018-03-26 DIAGNOSIS — Z8614 Personal history of Methicillin resistant Staphylococcus aureus infection: Secondary | ICD-10-CM | POA: Diagnosis not present

## 2018-03-27 ENCOUNTER — Telehealth: Payer: Self-pay | Admitting: Family Medicine

## 2018-03-27 ENCOUNTER — Other Ambulatory Visit: Payer: Self-pay | Admitting: Cardiology

## 2018-03-27 NOTE — Telephone Encounter (Signed)
Per [place] states she has a sore in her nose carrier MRSA. No fever, drainage or bleeding. Started 3 days ago.

## 2018-03-28 ENCOUNTER — Encounter: Payer: Self-pay | Admitting: Family Medicine

## 2018-03-28 ENCOUNTER — Ambulatory Visit (INDEPENDENT_AMBULATORY_CARE_PROVIDER_SITE_OTHER): Payer: BLUE CROSS/BLUE SHIELD | Admitting: Family Medicine

## 2018-03-28 VITALS — BP 114/62 | HR 86 | Temp 98.2°F | Ht 66.0 in | Wt 246.6 lb

## 2018-03-28 DIAGNOSIS — J3489 Other specified disorders of nose and nasal sinuses: Secondary | ICD-10-CM

## 2018-03-28 MED ORDER — MUPIROCIN CALCIUM 2 % NA OINT: 1.0000 "application " | TOPICAL_OINTMENT | Freq: Two times a day (BID) | NASAL | 0 refills | Status: DC

## 2018-03-28 NOTE — Telephone Encounter (Signed)
Patient scheduled for today at 4:00

## 2018-03-28 NOTE — Patient Instructions (Addendum)
Since there is a question of mrsa- bactrim would cover this but lets hit it topically and I think the coating of the bactroban may help some. In addition this would help eradicate MRSA if present.   If you are not improving by Monday see [place] back - if symptoms worsen through this week- give me a call and I will refer you to ENT for evaluation

## 2018-03-28 NOTE — Progress Notes (Signed)
Subjective:  [name] is a 57 y.o. year old very pleasant female patient who presents for/with See problem oriented charting ROS- endorses recent cough, congestion- now improved. Has right nasal passageway sinus tenderness. No fever or chills.    Past Medical History-  Patient Active Problem List   Diagnosis Date Noted  . Obstructive chronic bronchitis without exacerbation ([place]) 09/18/2013    Priority: High  . Chest pain 04/11/2013    Priority: High  . Hypertensive heart disease with chronic diastolic congestive heart failure ([place])     Priority: High  . Sarcoidosis of lung ([place]) 04/10/2007    Priority: High  . Type II diabetes mellitus with renal manifestations ([place]) 08/21/2006    Priority: High  . Epiglottitis     Priority: Medium  . Osteoarthritis of right knee 01/26/2017    Priority: Medium  . CKD (chronic kidney disease) stage 3, GFR 30-59 ml/min (HCC) 04/27/2015    Priority: Medium  . Fatty liver 09/30/2014    Priority: Medium  . Depression     Priority: Medium  . Gout 08/20/2010    Priority: Medium  . Anemia 09/18/2009    Priority: Medium  . Sleep apnea 04/21/2009    Priority: Medium  . Hyperlipidemia 08/21/2006    Priority: Medium  . Essential hypertension 08/21/2006    Priority: Medium  . Dysphagia 09/20/2017    Priority: Low  . Plantar fasciitis, right 07/13/2017    Priority: Low  . Osteoarthrosis, localized, primary, knee, right     Priority: Low  . Sprain of calcaneofibular ligament of right ankle 06/06/2017    Priority: Low  . Onychomycosis 10/27/2015    Priority: Low  . MRSA (methicillin resistant staph aureus) culture positive 03/27/2015    Priority: Low  . Hot flashes 07/15/2014    Priority: Low  . Abnormal SPEP 04/17/2014    Priority: Low  . Fracture of left leg 04/17/2014    Priority: Low  . Cushingoid side effect of steroids ([place]) 04/17/2014    Priority: Low  . Internal hemorrhoids     Priority: Low  . Preoperative clearance  03/25/2014    Priority: Low  . Solitary pulmonary nodule, on CT 02/2013 - stable over 2 years in 2015 02/20/2013    Priority: Low  . Morbid obesity ([place]) 06/04/2009    Priority: Low  . GERD 08/21/2006    Priority: Low  . Total knee replacement status, left 12/07/2017  . Unilateral primary osteoarthritis, left knee 11/07/2017  . S/P total knee arthroplasty, right 06/29/2017  . Multiple allergies 10/14/2016  . Wound infection complicating hardware ([place]) 03/27/2015    Medications- reviewed and updated Current Outpatient Medications  Medication Sig Dispense Refill  . albuterol (PROVENTIL HFA;VENTOLIN HFA) 108 (90 Base) MCG/ACT inhaler Inhale 1-2 puffs into the lungs every 6 (six) hours as needed for wheezing or shortness of breath. 8 g 5  . allopurinol (ZYLOPRIM) 100 MG tablet Take 100 mg by mouth 2 (two) times daily.     [name] amoxicillin-clavulanate (AUGMENTIN) 875-125 MG tablet Take 1 tablet by mouth 2 (two) times daily. 20 tablet 0  . aspirin EC 81 MG tablet Take 81 mg by mouth daily.     [name] atorvastatin (LIPITOR) 40 MG tablet TAKE 1 TAB BY MOUTH ONCE DAILY (Patient taking differently: Take 40 mg by mouth once a day) 90 tablet 3  . BAYER MICROLET LANCETS lancets Use as instructed to check sugar 3 times daily 300 each 5  . benzonatate (TESSALON) 200 MG  capsule Take 200 mg by mouth 3 (three) times daily as needed for cough.     [name] buPROPion (WELLBUTRIN XL) 300 MG 24 hr tablet Take 1 tablet (300 mg total) by mouth daily. 90 tablet 3  . Calcium Citrate 200 MG TABS Take 200 mg by mouth daily.    . carvedilol (COREG) 25 MG tablet TAKE 1 TABLET (25 MG TOTAL) BY MOUTH 2 (TWO) TIMES DAILY WITH A MEAL. 180 tablet 1  . Cholecalciferol (VITAMIN D) 2000 units tablet Take 2,000 Units by mouth daily.    . colestipol (COLESTID) 1 g tablet Take 2 tablets (2 g total) by mouth 2 (two) times daily. 120 tablet 5  . Cyanocobalamin (VITAMIN B-12 PO) Take 2,500 mcg by mouth daily.     [name] DEXILANT 60 MG capsule TAKE 1  CAPSULE BY MOUTH EVERY DAY 30 capsule 0  . dicyclomine (BENTYL) 20 MG tablet Take one tablet 15 minutes before meals 90 tablet 1  . diltiazem (CARDIZEM CD) 120 MG 24 hr capsule TAKE 1 CAPSULE BY MOUTH EVERY DAY (FOR FUTURE REFILLS MAKE A DOCTORS VISIT) 90 capsule 3  . fenofibrate (TRICOR) 145 MG tablet TAKE 1 TABLET BY MOUTH EVERY DAY 30 tablet 11  . fluticasone (FLONASE) 50 MCG/ACT nasal spray Place 2 sprays into both nostrils daily. (Patient taking differently: Place 2 sprays into both nostrils daily as needed for allergies. ) 48 g 3  . fluticasone furoate-vilanterol (BREO ELLIPTA) 200-25 MCG/INH AEPB Inhale 1 puff into the lungs daily.    . furosemide (LASIX) 40 MG tablet TAKE 1 TABLET BY MOUTH TWICE A DAY 180 tablet 3  . gabapentin (NEURONTIN) 100 MG capsule Take 1 capsule (100 mg total) by mouth 3 (three) times daily. 270 capsule 0  . glucose blood (BAYER CONTOUR TEST) test strip Use as instructed to check sugar 3 times daily 300 each 5  . HYDROcodone-acetaminophen (NORCO/VICODIN) 5-325 MG tablet Take 1 tablet by mouth every 4 (four) hours as needed for moderate pain. 60 tablet 0  . insulin aspart (NOVOLOG FLEXPEN) 100 UNIT/ML FlexPen Inject 30-35 Units into the skin 3 (three) times daily with meals. 30 mL 5  . Insulin Glargine (TOUJEO SOLOSTAR) 300 UNIT/ML SOPN Inject 40 Units into the skin at bedtime. 5 pen 11  . Insulin Pen Needle 33G X 4 MM MISC 1 each by Does not apply route 4 (four) times daily. 200 each 11  . ipratropium (ATROVENT) 0.06 % nasal spray Place 2 sprays into both nostrils 4 (four) times daily. 15 mL 0  . LORazepam (ATIVAN) 0.5 MG tablet Take 0.5 mg by mouth every 12 (twelve) hours as needed for anxiety.    . metFORMIN (GLUCOPHAGE) 1000 MG tablet TAKE 1 TABLET BY MOUTH TWICE A DAY WITH A MEAL 180 tablet 0  . nitroGLYCERIN (NITROSTAT) 0.4 MG SL tablet Place 1 tablet (0.4 mg total) under the tongue every 5 (five) minutes as needed for chest pain. Reported on 01/05/2016 25 tablet 6   . ondansetron (ZOFRAN-ODT) 4 MG disintegrating tablet Take 4 mg by mouth every 8 (eight) hours as needed for nausea or vomiting.     [name] PARoxetine Mesylate (BRISDELLE) 7.5 MG CAPS Take 7.5 mg by mouth daily.    . Potassium Chloride ER 20 MEQ TBCR Take 1.5 tablets by mouth 2 (two) times daily. 90 tablet 5  . predniSONE (DELTASONE) 5 MG tablet Take 5 mg by mouth daily with breakfast.    . senna (SENOKOT) 8.6 MG TABS tablet Take 1  tablet (8.6 mg total) by mouth daily as needed for mild constipation or moderate constipation. 120 each 0  . telmisartan (MICARDIS) 20 MG tablet TAKE 1 TABLET BY MOUTH EVERY DAY 90 tablet 2  . triamcinolone cream (KENALOG) 0.1 % Apply 1 application topically daily as needed (for irritation).     . venlafaxine XR (EFFEXOR-XR) 75 MG 24 hr capsule Take 1 capsule (75 mg total) by mouth 2 (two) times daily.     No current facility-administered medications for this visit.     Objective: BP 114/62 (BP Location: Left Arm, Patient Position: Sitting, Cuff Size: Large)   Pulse 86   Temp 98.2 F (36.8 C) (Oral)   Ht 5\' 6"  (1.676 m)   Wt 246 lb 9.6 oz (111.9 kg)   SpO2 97%   BMI 39.80 kg/m  Gen: NAD, resting comfortably TM normal, oropharynx normal, nasal turbinates on right and left normal. In right nostril along the nasal septum there is some rawness/erythema that is tender to touch CV: RRR no murmurs rubs or gallops Lungs: CTAB no crackles, wheeze, rhonchi Abdomen: soft/nontender/nondistended/normal bowel sounds. No rebound or guarding.  Ext: trace edema Skin: warm, dry Neuro: normal speech, moves all extremities  Assessment/Plan:  Nose pain S: cold symptoms started Wednesday of last week- went to urgent care on Thursday of last week- she finished a z pack. After that she noted soreness in right nostril and pain up to 6/10 or so. Sore feeling. Worse as air passes through. Has tried nasal saline- no help. On bactrim from urgent care from Sunday. Tried flonase and no  help- advised to stop. They tested her for MRSA- she does not know results.  A/P: appears to have inflammed nasal mucosa- almost raw for unclear cause. Do not suspect fungal illness at present but if doesn't respond to treatment would consider. Could consider ENT eval as wellas below.  From AVS:  " Since there is a question of mrsa- bactrim would cover this but lets hit it topically and I think the coating of the bactroban may help some. In addition this would help eradicate MRSA if present.   If you are not improving by Monday see [place] back - if symptoms worsen through this week- give me a call and I will refer you to ENT for evaluation "   Future Appointments  Date Time Provider [place]  04/03/2018 10:00 AM [facility] NURSE [facility]  04/06/2018  8:00 AM Renato Shin, MD [facility] None  08/24/2018 10:00 AM [name], MD [facility]    Meds ordered this encounter  Medications  . Printed: mupirocin nasal ointment (BACTROBAN) 2 %    Sig: Place 1 application into the nose 2 (two) times daily. Use one-half of tube in each nostril twice daily for five (5) days. After application, press sides of nose together and gently massage.    Dispense:  10 g    Refill:  0    Return precautions advised.  Garret Reddish, MD

## 2018-03-30 ENCOUNTER — Ambulatory Visit: Payer: BLUE CROSS/BLUE SHIELD

## 2018-04-03 ENCOUNTER — Ambulatory Visit (INDEPENDENT_AMBULATORY_CARE_PROVIDER_SITE_OTHER): Payer: BLUE CROSS/BLUE SHIELD

## 2018-04-03 DIAGNOSIS — Z23 Encounter for immunization: Secondary | ICD-10-CM

## 2018-04-03 NOTE — Progress Notes (Signed)
Patient in today for 2nd part of Twinrix vaccine. Vaccine administered in left deltoid. No reaction to the vaccine. Patient didn't voice any concerns about the vaccine.

## 2018-04-05 ENCOUNTER — Other Ambulatory Visit: Payer: Self-pay | Admitting: Endocrinology

## 2018-04-05 ENCOUNTER — Ambulatory Visit (INDEPENDENT_AMBULATORY_CARE_PROVIDER_SITE_OTHER): Payer: BLUE CROSS/BLUE SHIELD | Admitting: Family Medicine

## 2018-04-05 ENCOUNTER — Encounter: Payer: Self-pay | Admitting: Family Medicine

## 2018-04-05 VITALS — BP 124/60 | HR 79 | Temp 98.7°F | Ht 66.0 in | Wt 250.0 lb

## 2018-04-05 DIAGNOSIS — F419 Anxiety disorder, unspecified: Secondary | ICD-10-CM | POA: Insufficient documentation

## 2018-04-05 DIAGNOSIS — R1012 Left upper quadrant pain: Secondary | ICD-10-CM | POA: Diagnosis not present

## 2018-04-05 MED ORDER — ONDANSETRON 4 MG PO TBDP: 4.0000 mg | ORAL_TABLET | Freq: Three times a day (TID) | ORAL | 1 refills | Status: DC | PRN

## 2018-04-05 NOTE — Progress Notes (Signed)
Subjective:  [name] is a 57 y.o. year old very pleasant female patient who presents for/with See problem oriented charting ROS- denies vomiting. Does have nausea and LUQ pain. No fever or chills reported. Is having infrequent bowel movements   Past Medical History-  Patient Active Problem List   Diagnosis Date Noted  . Obstructive chronic bronchitis without exacerbation ([place]) 09/18/2013    Priority: High  . Chest pain 04/11/2013    Priority: High  . Hypertensive heart disease with chronic diastolic congestive heart failure ([place])     Priority: High  . Sarcoidosis of lung ([place]) 04/10/2007    Priority: High  . Type II diabetes mellitus with renal manifestations ([place]) 08/21/2006    Priority: High  . Epiglottitis     Priority: Medium  . Osteoarthritis of right knee 01/26/2017    Priority: Medium  . CKD (chronic kidney disease) stage 3, GFR 30-59 ml/min (HCC) 04/27/2015    Priority: Medium  . Fatty liver 09/30/2014    Priority: Medium  . Depression     Priority: Medium  . Gout 08/20/2010    Priority: Medium  . Anemia 09/18/2009    Priority: Medium  . Sleep apnea 04/21/2009    Priority: Medium  . Hyperlipidemia 08/21/2006    Priority: Medium  . Essential hypertension 08/21/2006    Priority: Medium  . Dysphagia 09/20/2017    Priority: Low  . Plantar fasciitis, right 07/13/2017    Priority: Low  . Osteoarthrosis, localized, primary, knee, right     Priority: Low  . Sprain of calcaneofibular ligament of right ankle 06/06/2017    Priority: Low  . Onychomycosis 10/27/2015    Priority: Low  . MRSA (methicillin resistant staph aureus) culture positive 03/27/2015    Priority: Low  . Hot flashes 07/15/2014    Priority: Low  . Abnormal SPEP 04/17/2014    Priority: Low  . Fracture of left leg 04/17/2014    Priority: Low  . Cushingoid side effect of steroids ([place]) 04/17/2014    Priority: Low  . Internal hemorrhoids     Priority: Low  . Preoperative clearance  03/25/2014    Priority: Low  . Solitary pulmonary nodule, on CT 02/2013 - stable over 2 years in 2015 02/20/2013    Priority: Low  . Morbid obesity ([place]) 06/04/2009    Priority: Low  . GERD 08/21/2006    Priority: Low  . Anxiety 04/05/2018  . Total knee replacement status, left 12/07/2017  . Unilateral primary osteoarthritis, left knee 11/07/2017  . S/P total knee arthroplasty, right 06/29/2017  . Multiple allergies 10/14/2016  . Wound infection complicating hardware ([place]) 03/27/2015    Medications- reviewed and updated Current Outpatient Medications  Medication Sig Dispense Refill  . albuterol (PROVENTIL HFA;VENTOLIN HFA) 108 (90 Base) MCG/ACT inhaler Inhale 1-2 puffs into the lungs every 6 (six) hours as needed for wheezing or shortness of breath. 8 g 5  . allopurinol (ZYLOPRIM) 100 MG tablet Take 100 mg by mouth 2 (two) times daily.     [name] aspirin EC 81 MG tablet Take 81 mg by mouth daily.     [name] atorvastatin (LIPITOR) 40 MG tablet TAKE 1 TAB BY MOUTH ONCE DAILY (Patient taking differently: Take 40 mg by mouth once a day) 90 tablet 3  . BAYER MICROLET LANCETS lancets Use as instructed to check sugar 3 times daily 300 each 5  . benzonatate (TESSALON) 200 MG capsule Take 200 mg by mouth 3 (three) times daily as needed for cough.     [name]  buPROPion (WELLBUTRIN XL) 300 MG 24 hr tablet Take 1 tablet (300 mg total) by mouth daily. 90 tablet 3  . Calcium Citrate 200 MG TABS Take 200 mg by mouth daily.    . carvedilol (COREG) 25 MG tablet TAKE 1 TABLET (25 MG TOTAL) BY MOUTH 2 (TWO) TIMES DAILY WITH A MEAL. 180 tablet 1  . Cholecalciferol (VITAMIN D) 2000 units tablet Take 2,000 Units by mouth daily.    . colestipol (COLESTID) 1 g tablet Take 2 tablets (2 g total) by mouth 2 (two) times daily. 120 tablet 5  . Cyanocobalamin (VITAMIN B-12 PO) Take 2,500 mcg by mouth daily.     [name] DEXILANT 60 MG capsule TAKE 1 CAPSULE BY MOUTH EVERY DAY 30 capsule 0  . dicyclomine (BENTYL) 20 MG tablet Take one  tablet 15 minutes before meals 90 tablet 1  . diltiazem (CARDIZEM CD) 120 MG 24 hr capsule TAKE 1 CAPSULE BY MOUTH EVERY DAY (FOR FUTURE REFILLS MAKE A DOCTORS VISIT) 90 capsule 3  . fenofibrate (TRICOR) 145 MG tablet TAKE 1 TABLET BY MOUTH EVERY DAY 30 tablet 11  . fluticasone (FLONASE) 50 MCG/ACT nasal spray Place 2 sprays into both nostrils daily. (Patient taking differently: Place 2 sprays into both nostrils daily as needed for allergies. ) 48 g 3  . fluticasone furoate-vilanterol (BREO ELLIPTA) 200-25 MCG/INH AEPB Inhale 1 puff into the lungs daily.    . furosemide (LASIX) 40 MG tablet TAKE 1 TABLET BY MOUTH TWICE A DAY 180 tablet 3  . gabapentin (NEURONTIN) 100 MG capsule Take 1 capsule (100 mg total) by mouth 3 (three) times daily. 270 capsule 0  . glucose blood (BAYER CONTOUR TEST) test strip Use as instructed to check sugar 3 times daily 300 each 5  . HYDROcodone-acetaminophen (NORCO/VICODIN) 5-325 MG tablet Take 1 tablet by mouth every 4 (four) hours as needed for moderate pain. 60 tablet 0  . insulin aspart (NOVOLOG FLEXPEN) 100 UNIT/ML FlexPen Inject 30-35 Units into the skin 3 (three) times daily with meals. 30 mL 5  . Insulin Glargine (TOUJEO SOLOSTAR) 300 UNIT/ML SOPN Inject 40 Units into the skin at bedtime. 5 pen 11  . Insulin Pen Needle 33G X 4 MM MISC 1 each by Does not apply route 4 (four) times daily. 200 each 11  . ipratropium (ATROVENT) 0.06 % nasal spray Place 2 sprays into both nostrils 4 (four) times daily. 15 mL 0  . LORazepam (ATIVAN) 0.5 MG tablet Take 0.5 mg by mouth every 12 (twelve) hours as needed for anxiety.    . metFORMIN (GLUCOPHAGE) 1000 MG tablet TAKE 1 TABLET BY MOUTH TWICE A DAY WITH A MEAL 180 tablet 0  . nitroGLYCERIN (NITROSTAT) 0.4 MG SL tablet Place 1 tablet (0.4 mg total) under the tongue every 5 (five) minutes as needed for chest pain. Reported on 01/05/2016 25 tablet 6  . ondansetron (ZOFRAN-ODT) 4 MG disintegrating tablet Take 1 tablet (4 mg total) by  mouth every 8 (eight) hours as needed for nausea or vomiting. 20 tablet 1  . PARoxetine Mesylate (BRISDELLE) 7.5 MG CAPS Take 7.5 mg by mouth daily.    . Potassium Chloride ER 20 MEQ TBCR Take 1.5 tablets by mouth 2 (two) times daily. 90 tablet 5  . predniSONE (DELTASONE) 5 MG tablet Take 5 mg by mouth daily with breakfast.    . senna (SENOKOT) 8.6 MG TABS tablet Take 1 tablet (8.6 mg total) by mouth daily as needed for mild constipation or moderate constipation. [place]  each 0  . telmisartan (MICARDIS) 20 MG tablet TAKE 1 TABLET BY MOUTH EVERY DAY 90 tablet 2  . triamcinolone cream (KENALOG) 0.1 % Apply 1 application topically daily as needed (for irritation).     . venlafaxine XR (EFFEXOR-XR) 75 MG 24 hr capsule Take 1 capsule (75 mg total) by mouth 2 (two) times daily.     No current facility-administered medications for this visit.     Objective: BP 124/60 (BP Location: Left Arm, Patient Position: Sitting, Cuff Size: Large)   Pulse 79   Temp 98.7 F (37.1 C) (Oral)   Ht 5\' 6"  (1.676 m)   Wt 250 lb (113.4 kg)   SpO2 97%   BMI 40.35 kg/m  Gen: NAD, resting comfortably CV: RRR no murmurs rubs or gallops Lungs: CTAB no crackles, wheeze, rhonchi Abdomen: soft/moderately tender to palpation in LUQ and epigastric area/protuberant abdomen from morbid obesity- not obviously distended/normal bowel sounds. No rebound or guarding.  Ext: trace edema Skin: warm, dry  Results for orders placed or performed in visit on 04/05/18 (from the past 72 hour(s))  CBC with Differential/Platelet     Status: Abnormal   Collection Time: 04/05/18  3:45 PM  Result Value Ref Range   WBC 11.8 (H) 4.0 - 10.5 K/uL   RBC 3.60 (L) 3.87 - 5.11 Mil/uL   Hemoglobin 9.7 (L) 12.0 - 15.0 g/dL   HCT 29.8 (L) 36.0 - 46.0 %   MCV 82.8 78.0 - 100.0 fl   MCHC 32.6 30.0 - 36.0 g/dL   RDW 15.0 11.5 - 15.5 %   Platelets 323.0 150.0 - 400.0 K/uL   Neutrophils Relative % 71.8 43.0 - 77.0 %   Lymphocytes Relative 17.6 12.0 -  46.0 %   Monocytes Relative 6.0 3.0 - 12.0 %   Eosinophils Relative 2.7 0.0 - 5.0 %   Basophils Relative 1.9 0.0 - 3.0 %   Neutro Abs 8.5 (H) 1.4 - 7.7 K/uL   Lymphs Abs 2.1 0.7 - 4.0 K/uL   Monocytes Absolute 0.7 0.1 - 1.0 K/uL   Eosinophils Absolute 0.3 0.0 - 0.7 K/uL   Basophils Absolute 0.2 (H) 0.0 - 0.1 K/uL  Comprehensive metabolic panel     Status: Abnormal   Collection Time: 04/05/18  3:45 PM  Result Value Ref Range   Sodium 136 135 - 145 mEq/L   Potassium 4.2 3.5 - 5.1 mEq/L   Chloride 99 96 - 112 mEq/L   CO2 29 19 - 32 mEq/L   Glucose, Bld 182 (H) 70 - 99 mg/dL   BUN 28 (H) 6 - 23 mg/dL   Creatinine, Ser 1.39 (H) 0.40 - 1.20 mg/dL   Total Bilirubin 0.4 0.2 - 1.2 mg/dL   Alkaline Phosphatase 57 39 - 117 U/L   AST 19 0 - 37 U/L   ALT 22 0 - 35 U/L   Total Protein 7.1 6.0 - 8.3 g/dL   Albumin 4.4 3.5 - 5.2 g/dL   Calcium 9.5 8.4 - 10.5 mg/dL   GFR 41.29 (L) >60.00 mL/min  Lipase     Status: None   Collection Time: 04/05/18  3:45 PM  Result Value Ref Range   Lipase 27.0 11.0 - 59.0 U/L   *Note: Due to a large number of results and/or encounters for the requested time period, some results have not been displayed. A complete set of results can be found in Results Review.   Assessment/Plan:  LUQ abdominal pain - Plan: CBC with Differential/Platelet, Comprehensive metabolic panel,  Lipase S:  Patient with LUQ pain for 2 weeks. Also with nausea after eating though no vomiting. Pain doesn't worsen with eating. Pain about 6-7/10 and comes and goes. Denies exertional pain though does just want to sit down if/when it occurs. Feeling it 3-4x a day and lasts 3-4 minutes. Does not have gallbladder. Seems to be worsening over last 2 weeks.   She remains on dexilant for reflux. Has more fatigue than usual. Did have hernia repair in this area.   Going to bathroom every 2-3 days on average for bowel movements. About 2 weeks. Last colonoscopy 2011. No blood in stool. No melena. On  miralax and colace and also using senna prn.   Had pain in lower abdomen previously and having diarrhea when had CT in may- had stool burden on exam.  A/P: LUQ pain in patient with cholecystectomy. Labs largely reassuring- stable hemoglobin with near baseline anemia, liapse not elevated- doubt pancreatitis. Does have some slight creatinine increase and we would like to plan follow up as per lab notes to address this. Will pull back on lasix for a few days as well.   Gave her some zofran for nausea - hopefully this will help her with PO intake of fluids  This could be constipation related "Increase miralax to 1 capful twice a day for a week to see if we can get bowels moving even better. After week go back to once a day. If no bowel movement in 2 days can use senna as well.   If not feeling any better in 10 days or symptoms worsen- return to see [place]. Also see [place] for fevers, vomiting, unintentional weight loss"   Future Appointments  Date Time Provider [place]  04/11/2018  7:30 AM Renato Shin, MD [facility] None  04/17/2018  8:30 AM [facility] LAB [facility]  08/23/2018  9:00 AM [facility] NURSE [facility]  08/24/2018 10:00 AM [name], MD [facility]   Lab/Order associations: LUQ abdominal pain - Plan: CBC with Differential/Platelet, Comprehensive metabolic panel, Lipase  Meds ordered this encounter  Medications  . ondansetron (ZOFRAN-ODT) 4 MG disintegrating tablet    Sig: Take 1 tablet (4 mg total) by mouth every 8 (eight) hours as needed for nausea or vomiting.    Dispense:  20 tablet    Refill:  1    Return precautions advised.  Garret Reddish, MD

## 2018-04-05 NOTE — Patient Instructions (Addendum)
Please stop by lab before you go  Increase miralax to 1 capful twice a day for a week to see if we can get bowels moving even better. After week go back to once a day. If no bowel movement in 2 days can use senna as well.   If not feeling any better in 10 days or symptoms worsen- return to see [place]. Also see [place] for fevers, vomiting, unintentional weight loss

## 2018-04-06 ENCOUNTER — Other Ambulatory Visit: Payer: Self-pay

## 2018-04-06 ENCOUNTER — Ambulatory Visit: Payer: BLUE CROSS/BLUE SHIELD | Admitting: Endocrinology

## 2018-04-06 DIAGNOSIS — R1012 Left upper quadrant pain: Secondary | ICD-10-CM

## 2018-04-06 LAB — CBC WITH DIFFERENTIAL/PLATELET
Basophils Absolute: 0.2 10*3/uL — ABNORMAL HIGH (ref 0.0–0.1)
Basophils Relative: 1.9 % (ref 0.0–3.0)
Eosinophils Absolute: 0.3 10*3/uL (ref 0.0–0.7)
Eosinophils Relative: 2.7 % (ref 0.0–5.0)
HCT: 29.8 % — ABNORMAL LOW (ref 36.0–46.0)
Hemoglobin: 9.7 g/dL — ABNORMAL LOW (ref 12.0–15.0)
Lymphocytes Relative: 17.6 % (ref 12.0–46.0)
Lymphs Abs: 2.1 10*3/uL (ref 0.7–4.0)
MCHC: 32.6 g/dL (ref 30.0–36.0)
MCV: 82.8 fl (ref 78.0–100.0)
Monocytes Absolute: 0.7 10*3/uL (ref 0.1–1.0)
Monocytes Relative: 6 % (ref 3.0–12.0)
Neutro Abs: 8.5 10*3/uL — ABNORMAL HIGH (ref 1.4–7.7)
Neutrophils Relative %: 71.8 % (ref 43.0–77.0)
Platelets: 323 10*3/uL (ref 150.0–400.0)
RBC: 3.6 Mil/uL — ABNORMAL LOW (ref 3.87–5.11)
RDW: 15 % (ref 11.5–15.5)
WBC: 11.8 10*3/uL — ABNORMAL HIGH (ref 4.0–10.5)

## 2018-04-06 LAB — COMPREHENSIVE METABOLIC PANEL
ALT: 22 U/L (ref 0–35)
AST: 19 U/L (ref 0–37)
Albumin: 4.4 g/dL (ref 3.5–5.2)
Alkaline Phosphatase: 57 U/L (ref 39–117)
BUN: 28 mg/dL — ABNORMAL HIGH (ref 6–23)
CO2: 29 mEq/L (ref 19–32)
Calcium: 9.5 mg/dL (ref 8.4–10.5)
Chloride: 99 mEq/L (ref 96–112)
Creatinine, Ser: 1.39 mg/dL — ABNORMAL HIGH (ref 0.40–1.20)
GFR: 41.29 mL/min — ABNORMAL LOW (ref >60.00)
Glucose, Bld: 182 mg/dL — ABNORMAL HIGH (ref 70–99)
Potassium: 4.2 mEq/L (ref 3.5–5.1)
Sodium: 136 mEq/L (ref 135–145)
Total Bilirubin: 0.4 mg/dL (ref 0.2–1.2)
Total Protein: 7.1 g/dL (ref 6.0–8.3)

## 2018-04-06 LAB — LIPASE: Lipase: 27 U/L (ref 11.0–59.0)

## 2018-04-11 ENCOUNTER — Encounter: Payer: Self-pay | Admitting: Endocrinology

## 2018-04-11 ENCOUNTER — Ambulatory Visit (INDEPENDENT_AMBULATORY_CARE_PROVIDER_SITE_OTHER): Payer: BLUE CROSS/BLUE SHIELD | Admitting: Endocrinology

## 2018-04-11 VITALS — BP 128/60 | HR 74 | Wt 253.0 lb

## 2018-04-11 DIAGNOSIS — E1129 Type 2 diabetes mellitus with other diabetic kidney complication: Secondary | ICD-10-CM | POA: Diagnosis not present

## 2018-04-11 DIAGNOSIS — Z794 Long term (current) use of insulin: Secondary | ICD-10-CM

## 2018-04-11 LAB — POCT GLYCOSYLATED HEMOGLOBIN (HGB A1C): Hemoglobin A1C: 7.6 % — AB (ref 4.0–5.6)

## 2018-04-11 MED ORDER — INSULIN ASPART 100 UNIT/ML FLEXPEN: 35.0000 [IU] | PEN_INJECTOR | Freq: Three times a day (TID) | SUBCUTANEOUS | 5 refills | Status: DC

## 2018-04-11 NOTE — Patient Instructions (Addendum)
check your blood sugar 3 times a day.  vary the time of day when you check, between before the 3 meals, and at bedtime.  also check if you have symptoms of your blood sugar being too high or too low.  please keep a record of the readings and bring it to your next appointment here (or you can bring the meter itself).  You can write it on any piece of paper.  please call us sooner if your blood sugar goes below 70, or if you have a lot of readings over 200. Please increase the novolog to 30-35 units 3 times a day (just before each meal), and: Please continue the same metformin and toujeo.  Please come back for a follow-up appointment in 3-4 months.

## 2018-04-11 NOTE — Progress Notes (Signed)
Subjective:    Patient ID: [name], female    DOB: November 28, 1960, 57 y.o.   MRN: [id number]  HPI Pt returns for f/u of diabetes mellitus: DM type: Insulin-requiring type 2 Dx'ed: 4268 Complications: polyneuropathy, renal insuff, and mild CAD Therapy: insulin since 2006, and 2 oral meds.  GDM: never DKA: never Severe hypoglycemia: never Pancreatitis: never Pancreatic imaging: CT (2015) showed fatty atrophy.  Other: she takes multiple daily injections; she intermittently takes prednisone for sarcoidosis.  Interval history:  no cbg record, but states cbg's vary from 130-200's.   Prednisone is 5 mg qd. pt states she feels better in general, since recent bilat TKR's.  Past Medical History:  Diagnosis Date  . Abnormal SPEP 04/17/2014  . Acute left ankle pain 01/26/2017  . ANEMIA-UNSPECIFIED 09/18/2009  . Anxiety   . Arthritis   . Chronic diastolic heart failure, NYHA class 2 (HCC)    Normal LVEDP by May 2018  . COPD (chronic obstructive pulmonary disease) ([place])   . Depression   . DIABETES MELLITUS, TYPE II 08/21/2006  . Diabetic osteomyelitis ([place]) 05/29/2015  . Fracture of 5th metatarsal    non union  . GERD 08/21/2006  . GOUT 08/20/2010  . Hx of umbilical hernia repair   . HYPERLIPIDEMIA 08/21/2006  . HYPERTENSION 08/21/2006  . Infection of wound due to methicillin resistant Staphylococcus aureus (MRSA)   . Internal hemorrhoids   . Multiple allergies 10/14/2016  . OBESITY 06/04/2009  . Onychomycosis 10/27/2015  . Osteomyelitis of left foot ([place]) 05/29/2015  . Pulmonary sarcoidosis ([place])    Followed locally by pulmonology, but also by Dr. Casper Harrison at [facility]  . Right knee pain 01/26/2017  . Vocal cord dysfunction   . Wears partial dentures     Past Surgical History:  Procedure Laterality Date  . ABDOMINAL HYSTERECTOMY    . APPENDECTOMY    . BLADDER SUSPENSION  11/11/2011   Procedure: TRANSVAGINAL TAPE (TVT) PROCEDURE;  Surgeon: Olga Millers, MD;   Location: [facility];  Service: Gynecology;  Laterality: N/A;  . CAROTIDS  02/18/11   CAROTID DUPLEX; VERTEBRALS ARE PATENT WITH ANTEGRADE FLOW. ICA/CCA RATIO 1.61 ON RIGHT AND 0.75 ON LEFT  . CHOLECYSTECTOMY  1984  . CYSTOSCOPY  11/11/2011   Procedure: CYSTOSCOPY;  Surgeon: Olga Millers, MD;  Location: [facility];  Service: Gynecology;  Laterality: N/A;  . EXTUBATION (ENDOTRACHEAL) IN OR N/A 09/23/2017   Procedure: EXTUBATION (ENDOTRACHEAL) IN OR;  Surgeon: Helayne Seminole, MD;  Location: [place];  Service: ENT;  Laterality: N/A;  . FIBEROPTIC LARYNGOSCOPY AND TRACHEOSCOPY N/A 09/23/2017   Procedure: FLEXIBLE FIBEROPTIC LARYNGOSCOPY;  Surgeon: Helayne Seminole, MD;  Location: [place];  Service: ENT;  Laterality: N/A;  . FRACTURE SURGERY     foot  . HERNIA REPAIR    . I&D EXTREMITY Left 06/27/2015   Procedure: Partial Excision Left Calcaneus, Place Antibiotic Beads, and Wound VAC;  Surgeon: Newt Minion, MD;  Location: [place];  Service: Orthopedics;  Laterality: Left;  . KNEE ARTHROSCOPY     right  . LEFT AND RIGHT HEART CATHETERIZATION WITH CORONARY ANGIOGRAM N/A 04/23/2013   Procedure: LEFT AND RIGHT HEART CATHETERIZATION WITH CORONARY ANGIOGRAM;  Surgeon: Leonie Man, MD;  Location: [facility];  Service: Cardiovascular;  Laterality: N/A;  . LEFT HEART CATH AND CORONARY ANGIOGRAPHY N/A 03/11/2017   Procedure: Left Heart Cath and Coronary Angiography;  Surgeon: Sherren Mocha, MD;  Location: [place] CV [facility]; angiographically minimal CAD  in the LAD otherwise normal.;  Normal LVEDP.  FALSE POSITIVE MYOVIEW  . LEFT HEART CATH AND CORONARY ANGIOGRAPHY  07/20/2010   LVEF 50-55% WITH VERY MILD GLOBAL HYPOKINESIA; ESSENTIALLY NORMAL CORONARY ARTERIES; NORMAL LV FUNCTION  . METATARSAL OSTEOTOMY WITH OPEN REDUCTION INTERNAL FIXATION (ORIF) METATARSAL WITH FUSION Left 04/09/2014   Procedure: LEFT FOOT FRACTURE OPEN TREATMENT METATARSAL INCLUDES INTERNAL FIXATION EACH;  Surgeon: Lorn Junes, MD;   Location: [place];  Service: Orthopedics;  Laterality: Left;  . NISSEN FUNDOPLICATION  1610  . NM MYOVIEW LTD  03/09/2013   Lexiscan --> EF 50%; NORMAL MYOCARDIAL PERFUSION STUDY - breast attenuation  . NM MYOVIEW LTD  03/10/2017   : Moderate size "stress-induced "perfusion defect at the apex as well as "ill-defined stress-induced perfusion defect in the lateral wall.  EF 55%.  INTERMEDIATE risk. -->  FALSE POSITIVE  . Right and left CARDIAC CATHETERIZATION  04/23/2013   Angiographic normal coronaries; LVEDP 20 mmHg, PCWP 12-14 mmHg, RAP 12 mmHg.; Fick CO/CI 4.9/2.2  . TOTAL KNEE ARTHROPLASTY Right 06/29/2017   Procedure: RIGHT TOTAL KNEE ARTHROPLASTY;  Surgeon: Newt Minion, MD;  Location: [place];  Service: Orthopedics;  Laterality: Right;  . TOTAL KNEE ARTHROPLASTY Left 12/07/2017  . TOTAL KNEE ARTHROPLASTY Left 12/07/2017   Procedure: LEFT TOTAL KNEE ARTHROPLASTY;  Surgeon: Newt Minion, MD;  Location: [place];  Service: Orthopedics;  Laterality: Left;  . TRACHEOSTOMY TUBE PLACEMENT N/A 09/20/2017   Procedure: AWAKE INTUBATION WITH ANESTHESIA WITH VIDEO ASSISTANCE;  Surgeon: Helayne Seminole, MD;  Location: [place] [facility];  Service: ENT;  Laterality: N/A;  . TRANSTHORACIC ECHOCARDIOGRAM  08/2014   Normal LV size and function.  Mild LVH.  EF 55-60%.  Normal regional wall motion.  GR 1 DD.  Normal RV size and function .  [name] TUBAL LIGATION     with reversal in 1994  . VENTRAL HERNIA REPAIR      Social History   Socioeconomic History  . Marital status: Married    Spouse name: AZHA CONSTANTIN  . Number of children: 2  . Years of education: 32  . Highest education level: Not on file  Occupational History  . Occupation: DISABLED  Social Needs  . Financial resource strain: Not on file  . Food insecurity:    Worry: Not on file    Inability: Not on file  . Transportation needs:    Medical: Not on file    Non-medical: Not on file  Tobacco Use  . Smoking status: Never  Smoker  . Smokeless tobacco: Never Used  Substance and Sexual Activity  . Alcohol use: No    Alcohol/week: 0.0 oz  . Drug use: No  . Sexual activity: Yes    Birth control/protection: Surgical  Lifestyle  . Physical activity:    Days per week: Not on file    Minutes per session: Not on file  . Stress: Not on file  Relationships  . Social connections:    Talks on phone: Not on file    Gets together: Not on file    Attends religious service: Not on file    Active member of club or organization: Not on file    Attends meetings of clubs or organizations: Not on file    Relationship status: Not on file  . Intimate partner violence:    Fear of current or ex partner: Not on file    Emotionally abused: Not on file    Physically abused: Not on file  Forced sexual activity: Not on file  Other Topics Concern  . Not on file  Social History Narrative   Married 1994. 2 sons who both live close and 1 grandson.       Disability due to sarcoidosis. 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 albuterol (PROVENTIL HFA;VENTOLIN HFA) 108 (90 Base) MCG/ACT inhaler Inhale 1-2 puffs into the lungs every 6 (six) hours as needed for wheezing or shortness of breath. 8 g 5  . allopurinol (ZYLOPRIM) 100 MG tablet Take 100 mg by mouth 2 (two) times daily.     [name] aspirin EC 81 MG tablet Take 81 mg by mouth daily.     [name] atorvastatin (LIPITOR) 40 MG tablet TAKE 1 TAB BY MOUTH ONCE DAILY (Patient taking differently: Take 40 mg by mouth once a day) 90 tablet 3  . BAYER MICROLET LANCETS lancets Use as instructed to check sugar 3 times daily 300 each 5  . buPROPion (WELLBUTRIN XL) 300 MG 24 hr tablet Take 1 tablet (300 mg total) by mouth daily. 90 tablet 3  . Calcium Citrate 200 MG  TABS Take 200 mg by mouth daily.    . carvedilol (COREG) 25 MG tablet TAKE 1 TABLET (25 MG TOTAL) BY MOUTH 2 (TWO) TIMES DAILY WITH A MEAL. 180 tablet 1  . Cholecalciferol (VITAMIN D) 2000 units tablet Take 2,000 Units by mouth daily.    . colestipol (COLESTID) 1 g tablet Take 2 tablets (2 g total) by mouth 2 (two) times daily. 120 tablet 5  . Cyanocobalamin (VITAMIN B-12 PO) Take 2,500 mcg by mouth daily.     [name] DEXILANT 60 MG capsule TAKE 1 CAPSULE BY MOUTH EVERY DAY 30 capsule 0  . diltiazem (CARDIZEM CD) 120 MG 24 hr capsule TAKE 1 CAPSULE BY MOUTH EVERY DAY (FOR FUTURE REFILLS MAKE A DOCTORS VISIT) 90 capsule 3  . fenofibrate (TRICOR) 145 MG tablet TAKE 1 TABLET BY MOUTH EVERY DAY 30 tablet 11  . fluticasone (FLONASE) 50 MCG/ACT nasal spray Place 2 sprays into both nostrils daily. (Patient taking differently: Place 2 sprays into both nostrils daily as needed for allergies. ) 48 g 3  . fluticasone furoate-vilanterol (BREO ELLIPTA) 200-25 MCG/INH AEPB Inhale 1 puff into the lungs daily.    . furosemide (LASIX) 40 MG tablet TAKE 1 TABLET BY MOUTH TWICE A DAY 180 tablet 3  . gabapentin (NEURONTIN) 100 MG capsule Take 1 capsule (100 mg total) by mouth 3 (three) times daily. 270 capsule 0  . glucose blood (BAYER CONTOUR TEST) test strip Use as instructed to check sugar 3 times daily 300 each 5  . HYDROcodone-acetaminophen (NORCO/VICODIN) 5-325 MG tablet Take 1 tablet by mouth every 4 (four) hours as needed for moderate pain. 60 tablet 0  . Insulin Glargine (TOUJEO SOLOSTAR) 300 UNIT/ML SOPN Inject 40 Units into the skin at bedtime. 5 pen 11  . Insulin Pen Needle 33G X 4 MM MISC 1 each by Does not apply route 4 (four) times daily. 200 each 11  . ipratropium (ATROVENT) 0.06 % nasal spray Place 2 sprays into both nostrils 4 (four) times daily. 15 mL 0  . LORazepam (ATIVAN) 0.5 MG tablet Take 0.5 mg by mouth every 12 (twelve) hours as needed for  anxiety.    . metFORMIN (GLUCOPHAGE) 1000 MG tablet TAKE 1  TABLET BY MOUTH TWICE A DAY WITH A MEAL 180 tablet 0  . nitroGLYCERIN (NITROSTAT) 0.4 MG SL tablet Place 1 tablet (0.4 mg total) under the tongue every 5 (five) minutes as needed for chest pain. Reported on 01/05/2016 25 tablet 6  . ondansetron (ZOFRAN-ODT) 4 MG disintegrating tablet Take 1 tablet (4 mg total) by mouth every 8 (eight) hours as needed for nausea or vomiting. 20 tablet 1  . PARoxetine Mesylate (BRISDELLE) 7.5 MG CAPS Take 7.5 mg by mouth daily.    . Potassium Chloride ER 20 MEQ TBCR Take 1.5 tablets by mouth 2 (two) times daily. 90 tablet 5  . predniSONE (DELTASONE) 5 MG tablet Take 5 mg by mouth daily with breakfast.    . senna (SENOKOT) 8.6 MG TABS tablet Take 1 tablet (8.6 mg total) by mouth daily as needed for mild constipation or moderate constipation. 120 each 0  . telmisartan (MICARDIS) 20 MG tablet TAKE 1 TABLET BY MOUTH EVERY DAY 90 tablet 2  . triamcinolone cream (KENALOG) 0.1 % Apply 1 application topically daily as needed (for irritation).     . venlafaxine XR (EFFEXOR-XR) 75 MG 24 hr capsule Take 1 capsule (75 mg total) by mouth 2 (two) times daily.    . benzonatate (TESSALON) 200 MG capsule Take 200 mg by mouth 3 (three) times daily as needed for cough.     . dicyclomine (BENTYL) 20 MG tablet Take one tablet 15 minutes before meals (Patient not taking: Reported on 04/11/2018) 90 tablet 1  . [DISCONTINUED] mupirocin nasal ointment (BACTROBAN) 2 % Place 1 application into the nose 2 (two) times daily. Use one-half of tube in each nostril twice daily for five (5) days. After application, press sides of nose together and gently massage. 10 g 0   No current facility-administered medications on file prior to visit.     Allergies  Allergen Reactions  . Methotrexate Other (See Comments)    Peri-oral and buccal lesions  . Vancomycin Other (See Comments)    DOSE RELATED NEPHROTOXICITY  . Lisinopril Cough  . Chlorhexidine Itching  . Clindamycin/Lincomycin Nausea And Vomiting  and Rash    RASH > ALLERGY INTOLERANCE > NAUSEA & VOMITING  . Doxycycline Rash  . Teflaro [Ceftaroline] Rash    Tolerates ceftriaxone     Family History  Problem Relation Age of Onset  . Diabetes Father   . Heart attack Father 53  . Coronary artery disease Father   . Heart failure Father   . COPD Mother   . Emphysema Mother   . Asthma Mother   . Heart failure Mother   . Breast cancer Mother   . Heart attack Maternal Grandfather   . Sarcoidosis Maternal Uncle   . Lung cancer Brother   . Diabetes Brother   . Colon cancer Neg Hx     BP 128/60 (BP Location: Left Arm, Patient Position: Sitting, Cuff Size: Normal)   Pulse 74   Wt 253 lb (114.8 kg)   SpO2 97%   BMI 40.84 kg/m    Review of Systems She denies hypoglycemia    Objective:   Physical Exam VITAL SIGNS:  See vs page GENERAL: no distress Pulses: dorsalis pedis intact bilat.   MSK: no deformity of the feet CV: trace bilat leg edema Skin:  no ulcer on the feet.  normal color and temp on the feet. Neuro: sensation is intact to touch on the  feet, but decreased from normal Ext: There is bilateral onychomycosis of the toenails  Lab Results  Component Value Date   HGBA1C 7.6 (A) 04/11/2018       Assessment & Plan:  Insulin-requiring type 2 DM, with renal insuff: she needs increased rx   Patient Instructions  check your blood sugar 3 times a day.  vary the time of day when you check, between before the 3 meals, and at bedtime.  also check if you have symptoms of your blood sugar being too high or too low.  please keep a record of the readings and bring it to your next appointment here (or you can bring the meter itself).  You can write it on any piece of paper.  please call us sooner if your blood sugar goes below 70, or if you have a lot of readings over 200. Please increase the novolog to 30-35 units 3 times a day (just before each meal), and: Please continue the same metformin and toujeo.  Please come back  for a follow-up appointment in 3-4 months.

## 2018-04-16 ENCOUNTER — Other Ambulatory Visit: Payer: Self-pay | Admitting: Pulmonary Disease

## 2018-04-16 ENCOUNTER — Other Ambulatory Visit: Payer: Self-pay | Admitting: Endocrinology

## 2018-04-16 NOTE — Telephone Encounter (Signed)
Forwarded

## 2018-04-17 ENCOUNTER — Other Ambulatory Visit (INDEPENDENT_AMBULATORY_CARE_PROVIDER_SITE_OTHER): Payer: BLUE CROSS/BLUE SHIELD

## 2018-04-17 ENCOUNTER — Other Ambulatory Visit: Payer: Medicare Other

## 2018-04-17 DIAGNOSIS — R1012 Left upper quadrant pain: Secondary | ICD-10-CM | POA: Diagnosis not present

## 2018-04-17 DIAGNOSIS — H43811 Vitreous degeneration, right eye: Secondary | ICD-10-CM | POA: Diagnosis not present

## 2018-04-17 LAB — BASIC METABOLIC PANEL
BUN: 19 mg/dL (ref 6–23)
CO2: 29 mEq/L (ref 19–32)
Calcium: 9.3 mg/dL (ref 8.4–10.5)
Chloride: 98 mEq/L (ref 96–112)
Creatinine, Ser: 1.02 mg/dL (ref 0.40–1.20)
GFR: 59.01 mL/min — ABNORMAL LOW (ref >60.00)
Glucose, Bld: 235 mg/dL — ABNORMAL HIGH (ref 70–99)
Potassium: 4.7 mEq/L (ref 3.5–5.1)
Sodium: 136 mEq/L (ref 135–145)

## 2018-04-20 ENCOUNTER — Ambulatory Visit (INDEPENDENT_AMBULATORY_CARE_PROVIDER_SITE_OTHER): Payer: BLUE CROSS/BLUE SHIELD | Admitting: Family Medicine

## 2018-04-20 ENCOUNTER — Encounter: Payer: Self-pay | Admitting: Family Medicine

## 2018-04-20 DIAGNOSIS — K5900 Constipation, unspecified: Secondary | ICD-10-CM | POA: Diagnosis not present

## 2018-04-20 NOTE — Patient Instructions (Addendum)
Continue stool softener Continue miralax once a day Try dulcolax 5mg  1. 1 tablet tonight 2. If no BM tomorrow, take 2 tablets  3. If no BM in 2 days, take 3 tablets.   Whichever regimen/amount of dulcolax helps- can use that the next time- then step up if not working. Always continue stool softener and miralax.   Use a warm water/soap suds enema once a week if not having at least 3 bowel movements in a week with above regimen  http://www.ramirez.info/

## 2018-04-20 NOTE — Progress Notes (Signed)
Subjective:  [name] is a 57 y.o. year old very pleasant female patient who presents for/with See problem oriented charting ROS- no fever or chills, improved LUQ pain. Stable shortness of breath   Past Medical History-  Patient Active Problem List   Diagnosis Date Noted  . Constipation 04/21/2018    Priority: High  . Obstructive chronic bronchitis without exacerbation ([place]) 09/18/2013    Priority: High  . Chest pain 04/11/2013    Priority: High  . Hypertensive heart disease with chronic diastolic congestive heart failure ([place])     Priority: High  . Sarcoidosis of lung ([place]) 04/10/2007    Priority: High  . Type II diabetes mellitus with renal manifestations ([place]) 08/21/2006    Priority: High  . Epiglottitis     Priority: Medium  . Osteoarthritis of right knee 01/26/2017    Priority: Medium  . CKD (chronic kidney disease) stage 3, GFR 30-59 ml/min (HCC) 04/27/2015    Priority: Medium  . Fatty liver 09/30/2014    Priority: Medium  . Depression     Priority: Medium  . Gout 08/20/2010    Priority: Medium  . Anemia 09/18/2009    Priority: Medium  . Sleep apnea 04/21/2009    Priority: Medium  . Hyperlipidemia 08/21/2006    Priority: Medium  . Essential hypertension 08/21/2006    Priority: Medium  . Dysphagia 09/20/2017    Priority: Low  . Plantar fasciitis, right 07/13/2017    Priority: Low  . Osteoarthrosis, localized, primary, knee, right     Priority: Low  . Sprain of calcaneofibular ligament of right ankle 06/06/2017    Priority: Low  . Onychomycosis 10/27/2015    Priority: Low  . MRSA (methicillin resistant staph aureus) culture positive 03/27/2015    Priority: Low  . Hot flashes 07/15/2014    Priority: Low  . Abnormal SPEP 04/17/2014    Priority: Low  . Fracture of left leg 04/17/2014    Priority: Low  . Cushingoid side effect of steroids ([place]) 04/17/2014    Priority: Low  . Internal hemorrhoids     Priority: Low  . Preoperative clearance  03/25/2014    Priority: Low  . Solitary pulmonary nodule, on CT 02/2013 - stable over 2 years in 2015 02/20/2013    Priority: Low  . Morbid obesity ([place]) 06/04/2009    Priority: Low  . GERD 08/21/2006    Priority: Low  . Anxiety 04/05/2018  . Total knee replacement status, left 12/07/2017  . Unilateral primary osteoarthritis, left knee 11/07/2017  . S/P total knee arthroplasty, right 06/29/2017  . Multiple allergies 10/14/2016  . Wound infection complicating hardware ([place]) 03/27/2015    Medications- reviewed and updated Current Outpatient Medications  Medication Sig Dispense Refill  . albuterol (PROVENTIL HFA;VENTOLIN HFA) 108 (90 Base) MCG/ACT inhaler Inhale 1-2 puffs into the lungs every 6 (six) hours as needed for wheezing or shortness of breath. 8 g 5  . allopurinol (ZYLOPRIM) 100 MG tablet Take 100 mg by mouth 2 (two) times daily.     [name] aspirin EC 81 MG tablet Take 81 mg by mouth daily.     [name] atorvastatin (LIPITOR) 40 MG tablet TAKE 1 TAB BY MOUTH ONCE DAILY (Patient taking differently: Take 40 mg by mouth once a day) 90 tablet 3  . BAYER MICROLET LANCETS lancets Use as instructed to check sugar 3 times daily 300 each 5  . benzonatate (TESSALON) 200 MG capsule Take 200 mg by mouth 3 (three) times daily as needed  for cough.     [name] buPROPion (WELLBUTRIN XL) 300 MG 24 hr tablet Take 1 tablet (300 mg total) by mouth daily. 90 tablet 3  . Calcium Citrate 200 MG TABS Take 200 mg by mouth daily.    . carvedilol (COREG) 25 MG tablet TAKE 1 TABLET (25 MG TOTAL) BY MOUTH 2 (TWO) TIMES DAILY WITH A MEAL. 180 tablet 1  . Cholecalciferol (VITAMIN D) 2000 units tablet Take 2,000 Units by mouth daily.    . colestipol (COLESTID) 1 g tablet Take 2 tablets (2 g total) by mouth 2 (two) times daily. 120 tablet 5  . Cyanocobalamin (VITAMIN B-12 PO) Take 2,500 mcg by mouth daily.     [name] DEXILANT 60 MG capsule TAKE 1 CAPSULE BY MOUTH EVERY DAY 30 capsule 0  . dicyclomine (BENTYL) 20 MG tablet Take one  tablet 15 minutes before meals (Patient not taking: Reported on 04/11/2018) 90 tablet 1  . diltiazem (CARDIZEM CD) 120 MG 24 hr capsule TAKE 1 CAPSULE BY MOUTH EVERY DAY (FOR FUTURE REFILLS MAKE A DOCTORS VISIT) 90 capsule 3  . fenofibrate (TRICOR) 145 MG tablet TAKE 1 TABLET BY MOUTH EVERY DAY 30 tablet 11  . fluticasone (FLONASE) 50 MCG/ACT nasal spray Place 2 sprays into both nostrils daily. (Patient taking differently: Place 2 sprays into both nostrils daily as needed for allergies. ) 48 g 3  . fluticasone furoate-vilanterol (BREO ELLIPTA) 200-25 MCG/INH AEPB Inhale 1 puff into the lungs daily.    . furosemide (LASIX) 40 MG tablet TAKE 1 TABLET BY MOUTH TWICE A DAY 180 tablet 3  . gabapentin (NEURONTIN) 100 MG capsule TAKE 1 CAPSULE BY MOUTH THREE TIMES A DAY 270 capsule 0  . glucose blood (BAYER CONTOUR TEST) test strip Use as instructed to check sugar 3 times daily 300 each 5  . HYDROcodone-acetaminophen (NORCO/VICODIN) 5-325 MG tablet Take 1 tablet by mouth every 4 (four) hours as needed for moderate pain. 60 tablet 0  . insulin aspart (NOVOLOG FLEXPEN) 100 UNIT/ML FlexPen Inject 35-40 Units into the skin 3 (three) times daily with meals. 15 pen 5  . Insulin Glargine (TOUJEO SOLOSTAR) 300 UNIT/ML SOPN Inject 40 Units into the skin at bedtime. 5 pen 11  . Insulin Pen Needle 33G X 4 MM MISC 1 each by Does not apply route 4 (four) times daily. 200 each 11  . ipratropium (ATROVENT) 0.06 % nasal spray Place 2 sprays into both nostrils 4 (four) times daily. 15 mL 0  . LORazepam (ATIVAN) 0.5 MG tablet Take 0.5 mg by mouth every 12 (twelve) hours as needed for anxiety.    . metFORMIN (GLUCOPHAGE) 1000 MG tablet TAKE 1 TABLET BY MOUTH TWICE A DAY WITH A MEAL 180 tablet 0  . nitroGLYCERIN (NITROSTAT) 0.4 MG SL tablet Place 1 tablet (0.4 mg total) under the tongue every 5 (five) minutes as needed for chest pain. Reported on 01/05/2016 25 tablet 6  . ondansetron (ZOFRAN-ODT) 4 MG disintegrating tablet Take 1  tablet (4 mg total) by mouth every 8 (eight) hours as needed for nausea or vomiting. 20 tablet 1  . PARoxetine Mesylate (BRISDELLE) 7.5 MG CAPS Take 7.5 mg by mouth daily.    . Potassium Chloride ER 20 MEQ TBCR Take 1.5 tablets by mouth 2 (two) times daily. 90 tablet 5  . predniSONE (DELTASONE) 5 MG tablet Take 5 mg by mouth daily with breakfast.    . senna (SENOKOT) 8.6 MG TABS tablet Take 1 tablet (8.6 mg total) by mouth  daily as needed for mild constipation or moderate constipation. 120 each 0  . telmisartan (MICARDIS) 20 MG tablet TAKE 1 TABLET BY MOUTH EVERY DAY 90 tablet 2  . triamcinolone cream (KENALOG) 0.1 % Apply 1 application topically daily as needed (for irritation).     . venlafaxine XR (EFFEXOR-XR) 75 MG 24 hr capsule Take 1 capsule (75 mg total) by mouth 2 (two) times daily.     No current facility-administered medications for this visit.     Objective: BP 124/62 (BP Location: Left Arm, Patient Position: Sitting, Cuff Size: Normal)   Pulse 74   Temp 98.5 F (36.9 C) (Oral)   Ht 5\' 6"  (1.676 m)   Wt 250 lb 9.6 oz (113.7 kg)   SpO2 96%   BMI 40.45 kg/m  Gen: NAD, resting comfortably CV: RRR no murmurs rubs or gallops Lungs: CTAB no crackles, wheeze, rhonchi Abdomen: soft/nontender but is somewhat firm in epigastric area/nondistended/normal bowel sounds. No rebound or guarding.  Ext: trace edema Skin: warm, dry  Assessment/Plan:  Constipation S: 2-3 weeks ago we thought LUQ pain could be  constipation related "Increase miralax to 1 capful twice a day for a week to see if we can get bowels moving even better. After week go back to once a day. If no bowel movement in 2 days can use senna as well.   If not feeling any better in 10 days or symptoms worsen- return to see [place]. Also see [place] for fevers, vomiting, unintentional weight loss"  She tried the above regimen- was having bowel movements twice a week on the 1 capful twice a day as well as 1 capful daily- still going  about twice a week only. Has not noted any improvement on Senna.   Pain has improved some in LUQ - also some pain in RUQ noted. Strains and has firm stools when it comes out. Up to date on colonoscopy with last 04/2010.   Has already tried metamucil. Remains on stool softener daily.   Was going every other day before (so 3-4 days a week) now about 2x a week. Did have 1 good BM at beginning of this with start of miralax.  A/P: From avs  "Continue stool softener Continue miralax once a day Try dulcolax 5mg  1. 1 tablet tonight 2. If no BM tomorrow, take 2 tablets  3. If no BM in 2 days, take 3 tablets.   Whichever regimen/amount of dulcolax helps- can use that the next time- then step up if not working. Always continue stool softener and miralax.   Use a warm water/soap suds enema once a week if not having at least 3 bowel movements in a week with above regimen  http://www.ramirez.info/"  Had thought about amitiza but can cause shortness of breath. After visit thought about linzess though states 5% chance uri, 3% sinusitis which are common issues for her. If we are unable to help her adequately this visit or next would consider referral to GI under constipation and ask about whether we should consider earlier colonoscopy given this change in her bowel habits.   2 week recheck verbal. Could check TSH though doubt this is hypothyroidism related   Future Appointments  Date Time Provider [place]  08/11/2018  7:30 AM Renato Shin, MD [facility] None  08/23/2018  9:00 AM [facility] NURSE [facility]  08/24/2018 10:00 AM [name], MD [facility]   Return precautions advised.  Garret Reddish, MD

## 2018-04-21 DIAGNOSIS — K5900 Constipation, unspecified: Secondary | ICD-10-CM | POA: Insufficient documentation

## 2018-04-21 NOTE — Assessment & Plan Note (Signed)
S: 2-3 weeks ago we thought LUQ pain could be  constipation related "Increase miralax to 1 capful twice a day for a week to see if we can get bowels moving even better. After week go back to once a day. If no bowel movement in 2 days can use senna as well.   If not feeling any better in 10 days or symptoms worsen- return to see [place]. Also see [place] for fevers, vomiting, unintentional weight loss"  She tried the above regimen- was having bowel movements twice a week on the 1 capful twice a day as well as 1 capful daily- still going about twice a week only. Has not noted any improvement on Senna.   Pain has improved some in LUQ - also some pain in RUQ noted. Strains and has firm stools when it comes out. Up to date on colonoscopy with last 04/2010.   Has already tried metamucil. Remains on stool softener daily.   Was going every other day before (so 3-4 days a week) now about 2x a week. Did have 1 good BM at beginning of this with start of miralax.  A/P: From avs  "Continue stool softener Continue miralax once a day Try dulcolax 5mg  1. 1 tablet tonight 2. If no BM tomorrow, take 2 tablets  3. If no BM in 2 days, take 3 tablets.   Whichever regimen/amount of dulcolax helps- can use that the next time- then step up if not working. Always continue stool softener and miralax.   Use a warm water/soap suds enema once a week if not having at least 3 bowel movements in a week with above regimen  http://www.ramirez.info/"  Had thought about amitiza but can cause shortness of breath. After visit thought about linzess though states 5% chance uri, 3% sinusitis which are common issues for her. If we are unable to help her adequately this visit or next would consider referral to GI under constipation and ask about whether we should consider earlier colonoscopy given this change in her bowel habits.   2 week recheck verbal. Could check TSH though doubt this is hypothyroidism related

## 2018-05-05 ENCOUNTER — Encounter: Payer: Self-pay | Admitting: Internal Medicine

## 2018-05-05 ENCOUNTER — Ambulatory Visit (INDEPENDENT_AMBULATORY_CARE_PROVIDER_SITE_OTHER): Payer: BLUE CROSS/BLUE SHIELD | Admitting: Internal Medicine

## 2018-05-05 VITALS — BP 124/60 | HR 78 | Ht 66.0 in | Wt 250.0 lb

## 2018-05-05 DIAGNOSIS — K5900 Constipation, unspecified: Secondary | ICD-10-CM | POA: Diagnosis not present

## 2018-05-05 DIAGNOSIS — R1310 Dysphagia, unspecified: Secondary | ICD-10-CM | POA: Diagnosis not present

## 2018-05-05 DIAGNOSIS — R1030 Lower abdominal pain, unspecified: Secondary | ICD-10-CM | POA: Diagnosis not present

## 2018-05-05 DIAGNOSIS — K219 Gastro-esophageal reflux disease without esophagitis: Secondary | ICD-10-CM

## 2018-05-05 NOTE — Progress Notes (Signed)
HISTORY OF PRESENT ILLNESS:  [name] is a 57 y.o. female with multiple significant medical problems as listed below. She is accompanied by her husband and several children. She was last seen in this office 02/10/2018 regarding lower abdominal cramping and upper abdominal substernal burning. See that dictation for details. She tells me that her symptoms improved after having been prescribed Bentyl. She presents today with a chief complaint of worsening constipation associated with abdominal discomfort and bloating. As well recurrent dysphagia to pills. She did undergo upper endoscopy with esophageal dilation Venia Minks [address] [place]) in January 2014. She states this helped. She also has esophageal dysmotility. Review of medications, the patient is on Colestid. Previously prescribed for problems with diarrhea. Review of outside laboratories from July 2019 finds normal basic metabolic panel except for elevated glucose. Last hemoglobin 9.7 with MCV 82.8. Patient has had prior fundoplication. Her last colonoscopy was performed July 2011. The examination was normal except for mild melanosis and internal hemorrhoids. No polyps. Follow-up in 10 years recommended. Patient did have a CT scan in May 2019 to evaluate abdominal pain. No significant abnormalities noted except for increased colonic stool  REVIEW OF SYSTEMS:  All non-GI ROS negative unless otherwise stated in the history of present illness except for arthritis  Past Medical History:  Diagnosis Date  . Abnormal SPEP 04/17/2014  . Acute left ankle pain 01/26/2017  . ANEMIA-UNSPECIFIED 09/18/2009  . Anxiety   . Arthritis   . Chronic diastolic heart failure, NYHA class 2 (HCC)    Normal LVEDP by May 2018  . COPD (chronic obstructive pulmonary disease) ([place])   . Depression   . DIABETES MELLITUS, TYPE II 08/21/2006  . Diabetic osteomyelitis ([place]) 05/29/2015  . Fracture of 5th metatarsal    non union  . GERD 08/21/2006  . GOUT 08/20/2010  . Hx of  umbilical hernia repair   . HYPERLIPIDEMIA 08/21/2006  . HYPERTENSION 08/21/2006  . Infection of wound due to methicillin resistant Staphylococcus aureus (MRSA)   . Internal hemorrhoids   . Multiple allergies 10/14/2016  . OBESITY 06/04/2009  . Onychomycosis 10/27/2015  . Osteomyelitis of left foot ([place]) 05/29/2015  . Pulmonary sarcoidosis ([place])    Followed locally by pulmonology, but also by Dr. Casper Harrison at [facility]  . Right knee pain 01/26/2017  . Vocal cord dysfunction   . Wears partial dentures     Past Surgical History:  Procedure Laterality Date  . ABDOMINAL HYSTERECTOMY    . APPENDECTOMY    . BLADDER SUSPENSION  11/11/2011   Procedure: TRANSVAGINAL TAPE (TVT) PROCEDURE;  Surgeon: Olga Millers, MD;  Location: [facility];  Service: Gynecology;  Laterality: N/A;  . CAROTIDS  02/18/11   CAROTID DUPLEX; VERTEBRALS ARE PATENT WITH ANTEGRADE FLOW. ICA/CCA RATIO 1.61 ON RIGHT AND 0.75 ON LEFT  . CHOLECYSTECTOMY  1984  . CYSTOSCOPY  11/11/2011   Procedure: CYSTOSCOPY;  Surgeon: Olga Millers, MD;  Location: [facility];  Service: Gynecology;  Laterality: N/A;  . EXTUBATION (ENDOTRACHEAL) IN OR N/A 09/23/2017   Procedure: EXTUBATION (ENDOTRACHEAL) IN OR;  Surgeon: Helayne Seminole, MD;  Location: [facility];  Service: ENT;  Laterality: N/A;  . FIBEROPTIC LARYNGOSCOPY AND TRACHEOSCOPY N/A 09/23/2017   Procedure: FLEXIBLE FIBEROPTIC LARYNGOSCOPY;  Surgeon: Helayne Seminole, MD;  Location: [facility];  Service: ENT;  Laterality: N/A;  . FRACTURE SURGERY     foot  . HERNIA REPAIR    . I&D EXTREMITY Left 06/27/2015   Procedure: Partial Excision Left Calcaneus,  Place Antibiotic Beads, and Wound VAC;  Surgeon: Newt Minion, MD;  Location: [facility];  Service: Orthopedics;  Laterality: Left;  . KNEE ARTHROSCOPY     right  . LEFT AND RIGHT HEART CATHETERIZATION WITH CORONARY ANGIOGRAM N/A 04/23/2013   Procedure: LEFT AND RIGHT HEART CATHETERIZATION WITH CORONARY ANGIOGRAM;  Surgeon: Leonie Man, MD;  Location: [facility];  Service: Cardiovascular;  Laterality: N/A;  . LEFT HEART CATH AND CORONARY ANGIOGRAPHY N/A 03/11/2017   Procedure: Left Heart Cath and Coronary Angiography;  Surgeon: Sherren Mocha, MD;  Location: [facility]; angiographically minimal CAD in the LAD otherwise normal.;  Normal LVEDP.  FALSE POSITIVE MYOVIEW  . LEFT HEART CATH AND CORONARY ANGIOGRAPHY  07/20/2010   LVEF 50-55% WITH VERY MILD GLOBAL HYPOKINESIA; ESSENTIALLY NORMAL CORONARY ARTERIES; NORMAL LV FUNCTION  . METATARSAL OSTEOTOMY WITH OPEN REDUCTION INTERNAL FIXATION (ORIF) METATARSAL WITH FUSION Left 04/09/2014   Procedure: LEFT FOOT FRACTURE OPEN TREATMENT METATARSAL INCLUDES INTERNAL FIXATION EACH;  Surgeon: Lorn Junes, MD;  Location: [facility];  Service: Orthopedics;  Laterality: Left;  . NISSEN FUNDOPLICATION  8676  . NM MYOVIEW LTD  03/09/2013   Lexiscan --> EF 50%; NORMAL MYOCARDIAL PERFUSION STUDY - breast attenuation  . NM MYOVIEW LTD  03/10/2017   : Moderate size "stress-induced "perfusion defect at the apex as well as "ill-defined stress-induced perfusion defect in the lateral wall.  EF 55%.  INTERMEDIATE risk. -->  FALSE POSITIVE  . Right and left CARDIAC CATHETERIZATION  04/23/2013   Angiographic normal coronaries; LVEDP 20 mmHg, PCWP 12-14 mmHg, RAP 12 mmHg.; Fick CO/CI 4.9/2.2  . TOTAL KNEE ARTHROPLASTY Right 06/29/2017   Procedure: RIGHT TOTAL KNEE ARTHROPLASTY;  Surgeon: Newt Minion, MD;  Location: [facility];  Service: Orthopedics;  Laterality: Right;  . TOTAL KNEE ARTHROPLASTY Left 12/07/2017  . TOTAL KNEE ARTHROPLASTY Left 12/07/2017   Procedure: LEFT TOTAL KNEE ARTHROPLASTY;  Surgeon: Newt Minion, MD;  Location: [facility];  Service: Orthopedics;  Laterality: Left;  . TRACHEOSTOMY TUBE PLACEMENT N/A 09/20/2017   Procedure: AWAKE INTUBATION WITH ANESTHESIA WITH VIDEO ASSISTANCE;  Surgeon: Helayne Seminole, MD;  Location: [facility];  Service: ENT;  Laterality:  N/A;  . TRANSTHORACIC ECHOCARDIOGRAM  08/2014   Normal LV size and function.  Mild LVH.  EF 55-60%.  Normal regional wall motion.  GR 1 DD.  Normal RV size and function .  [name] TUBAL LIGATION     with reversal in 1994  . VENTRAL HERNIA REPAIR      Social History IASIA FORCIER  reports that she has never smoked. She has never used smokeless tobacco. She reports that she does not drink alcohol or use drugs.  family history includes Asthma in her mother; Breast cancer in her mother; COPD in her mother; Coronary artery disease in her father; Diabetes in her brother and father; Emphysema in her mother; Heart attack in her maternal grandfather; Heart attack (age of onset: 48) in her father; Heart failure in her father and mother; Lung cancer in her brother; Sarcoidosis in her maternal uncle; Throat cancer in her father.  Allergies  Allergen Reactions  . Methotrexate Other (See Comments)    Peri-oral and buccal lesions  . Vancomycin Other (See Comments)    DOSE RELATED NEPHROTOXICITY  . Lisinopril Cough  . Chlorhexidine Itching  . Clindamycin/Lincomycin Nausea And Vomiting and Rash    RASH > ALLERGY INTOLERANCE > NAUSEA & VOMITING  . Doxycycline Rash  .  Teflaro [Ceftaroline] Rash    Tolerates ceftriaxone        PHYSICAL EXAMINATION: Vital signs: BP 124/60   Pulse 78   Ht 5\' 6"  (1.676 m)   Wt 250 lb (113.4 kg)   BMI 40.35 kg/m   Constitutional: pleasant, obese, unhealthy appearing, no acute distress Psychiatric: alert and oriented x3, cooperative Eyes: extraocular movements intact, anicteric, conjunctiva pink Mouth: oral pharynx moist, no lesions. Edentulous Neck: supple no lymphadenopathy Cardiovascular: heart regular rate and rhythm, no murmur Lungs: clear to auscultation bilaterally Abdomen: soft,obese, nontender, nondistended, no obvious ascites, no peritoneal signs, normal bowel sounds, no organomegaly Rectal:omitted Extremities: no clubbing, cyanosis, orlower extremity  edema bilaterally Skin: no lesions on visible extremities Neuro: No focal deficits. Cranial nerves intact  ASSESSMENT:  #1. Chronic constipation. May be exacerbated by Colestid #2. Lower abdominal pain. Secondary to constipation. #3.Recurrent pill dysphagia. Prior history of esophageal stricture with dilation. #4. GERD status post prior fundoplication #5. Normal colonoscopy 2011 #6. Multiple significant medical problems   PLAN:  #1. Reflux precautions #2. Weight loss #3. Continue PPI #4. Schedule upper endoscopy with esophageal dilation. The patient is HIGH RISK given her body habitus, comorbidities, the need to address diabetic medications for the procedure.The nature of the procedure, as well as the risks, benefits, and alternatives were carefully and thoroughly reviewed with the patient. Ample time for discussion and questions allowed. The patient understood, was satisfied, and agreed to proceed. #5. Stop Colestid #6. If constipation persists after stopping Colestid then initiate linzess 290 g daily. We have provided her with samples to see if this is helpful. She will give me feedback at the time of her procedure #7. Routine screening colonoscopy 2021

## 2018-05-05 NOTE — Patient Instructions (Signed)
You have been scheduled for an endoscopy. Please follow written instructions given to you at your visit today. If you use inhalers (even only as needed), please bring them with you on the day of your procedure. Your physician has requested that you go to www.startemmi.com and enter the access code given to you at your visit today. This web site gives a general overview about your procedure. However, you should still follow specific instructions given to you by our office regarding your preparation for the procedure.  You have been given samples of Linzess 290mcg.  Try this for about 2 weeks and if it works, call the office and we will send a prescription in.  Per Dr. [name], discontinue your Colestid

## 2018-05-09 ENCOUNTER — Other Ambulatory Visit: Payer: Self-pay | Admitting: Family Medicine

## 2018-05-17 ENCOUNTER — Other Ambulatory Visit: Payer: Self-pay | Admitting: Pulmonary Disease

## 2018-05-18 ENCOUNTER — Encounter: Payer: Self-pay | Admitting: Family Medicine

## 2018-05-18 ENCOUNTER — Ambulatory Visit (INDEPENDENT_AMBULATORY_CARE_PROVIDER_SITE_OTHER): Payer: BLUE CROSS/BLUE SHIELD | Admitting: Family Medicine

## 2018-05-18 VITALS — BP 146/70 | HR 103 | Temp 99.3°F | Ht 66.0 in | Wt 247.4 lb

## 2018-05-18 DIAGNOSIS — A084 Viral intestinal infection, unspecified: Secondary | ICD-10-CM

## 2018-05-18 DIAGNOSIS — B9689 Other specified bacterial agents as the cause of diseases classified elsewhere: Secondary | ICD-10-CM

## 2018-05-18 DIAGNOSIS — R21 Rash and other nonspecific skin eruption: Secondary | ICD-10-CM | POA: Diagnosis not present

## 2018-05-18 DIAGNOSIS — J329 Chronic sinusitis, unspecified: Secondary | ICD-10-CM

## 2018-05-18 MED ORDER — AMOXICILLIN-POT CLAVULANATE 875-125 MG PO TABS: 1.0000 | ORAL_TABLET | Freq: Two times a day (BID) | ORAL | 0 refills | Status: AC

## 2018-05-18 MED ORDER — TRIAMCINOLONE ACETONIDE 0.1 % EX CREA: 1.0000 "application " | TOPICAL_CREAM | Freq: Two times a day (BID) | CUTANEOUS | 0 refills | Status: DC | PRN

## 2018-05-18 NOTE — Progress Notes (Signed)
PCP: [name], MD  Subjective:  [name] is a 57 y.o. year old very pleasant female patient who presents with sinusitis symptoms including nasal congestion, sinus tenderness  Yesterday had 15 episodes of diarrhea. Has nausea- unable to throw up due to nissen fundoplication. 2 small episodes today. No new/abnormal foods. Over a week since last linzess.   Dr. Jerline Pain treated her for sinus infection in may- she ended up on augmentin 2-3 days after visit as was worsening.   Sinus pressure started 10 days ago. Frontal sinuses. Not blowing anything out. Has tried tylenol sinus, nasal saline.  Also has a cough with it. Has vacation next Sunday- going to [employer]. No shortness of breath  Also with rash on right leg for 2-3 days.   ROS-denies fever- though temperature slightly high,  tooth pain. No chest pain   Pertinent Past Medical History-  Patient Active Problem List   Diagnosis Date Noted  . Constipation 04/21/2018    Priority: High  . Obstructive chronic bronchitis without exacerbation ([place]) 09/18/2013    Priority: High  . Chest pain 04/11/2013    Priority: High  . Hypertensive heart disease with chronic diastolic congestive heart failure ([place])     Priority: High  . Sarcoidosis of lung ([place]) 04/10/2007    Priority: High  . Type II diabetes mellitus with renal manifestations ([place]) 08/21/2006    Priority: High  . Epiglottitis     Priority: Medium  . Osteoarthritis of right knee 01/26/2017    Priority: Medium  . CKD (chronic kidney disease) stage 3, GFR 30-59 ml/min (HCC) 04/27/2015    Priority: Medium  . Fatty liver 09/30/2014    Priority: Medium  . Depression     Priority: Medium  . Gout 08/20/2010    Priority: Medium  . Anemia 09/18/2009    Priority: Medium  . Sleep apnea 04/21/2009    Priority: Medium  . Hyperlipidemia 08/21/2006    Priority: Medium  . Essential hypertension 08/21/2006    Priority: Medium  . Dysphagia 09/20/2017    Priority: Low   . Plantar fasciitis, right 07/13/2017    Priority: Low  . Osteoarthrosis, localized, primary, knee, right     Priority: Low  . Sprain of calcaneofibular ligament of right ankle 06/06/2017    Priority: Low  . Onychomycosis 10/27/2015    Priority: Low  . MRSA (methicillin resistant staph aureus) culture positive 03/27/2015    Priority: Low  . Hot flashes 07/15/2014    Priority: Low  . Abnormal SPEP 04/17/2014    Priority: Low  . Fracture of left leg 04/17/2014    Priority: Low  . Cushingoid side effect of steroids ([place]) 04/17/2014    Priority: Low  . Internal hemorrhoids     Priority: Low  . Preoperative clearance 03/25/2014    Priority: Low  . Solitary pulmonary nodule, on CT 02/2013 - stable over 2 years in 2015 02/20/2013    Priority: Low  . Morbid obesity ([place]) 06/04/2009    Priority: Low  . GERD 08/21/2006    Priority: Low  . Anxiety 04/05/2018  . Total knee replacement status, left 12/07/2017  . Unilateral primary osteoarthritis, left knee 11/07/2017  . S/P total knee arthroplasty, right 06/29/2017  . Multiple allergies 10/14/2016  . Wound infection complicating hardware ([place]) 03/27/2015    Medications- reviewed  Current Outpatient Medications  Medication Sig Dispense Refill  . albuterol (PROVENTIL HFA;VENTOLIN HFA) 108 (90 Base) MCG/ACT inhaler Inhale 1-2 puffs into the lungs every  6 (six) hours as needed for wheezing or shortness of breath. 8 g 5  . allopurinol (ZYLOPRIM) 100 MG tablet Take 100 mg by mouth 2 (two) times daily.     [name] aspirin EC 81 MG tablet Take 81 mg by mouth daily.     [name] atorvastatin (LIPITOR) 40 MG tablet TAKE 1 TAB BY MOUTH ONCE DAILY (Patient taking differently: Take 40 mg by mouth once a day) 90 tablet 3  . BAYER MICROLET LANCETS lancets Use as instructed to check sugar 3 times daily 300 each 5  . benzonatate (TESSALON) 200 MG capsule Take 200 mg by mouth 3 (three) times daily as needed for cough.     [name] buPROPion (WELLBUTRIN XL) 300 MG 24 hr  tablet Take 1 tablet (300 mg total) by mouth daily. 90 tablet 3  . Calcium Citrate 200 MG TABS Take 200 mg by mouth daily.    . carvedilol (COREG) 25 MG tablet TAKE 1 TABLET (25 MG TOTAL) BY MOUTH 2 (TWO) TIMES DAILY WITH A MEAL. 180 tablet 1  . Cholecalciferol (VITAMIN D) 2000 units tablet Take 2,000 Units by mouth daily.    . colestipol (COLESTID) 1 g tablet Take 2 tablets (2 g total) by mouth 2 (two) times daily. 120 tablet 5  . Cyanocobalamin (VITAMIN B-12 PO) Take 2,500 mcg by mouth daily.     [name] DEXILANT 60 MG capsule TAKE 1 CAPSULE BY MOUTH EVERY DAY 30 capsule 0  . dicyclomine (BENTYL) 20 MG tablet Take one tablet 15 minutes before meals 90 tablet 1  . diltiazem (CARDIZEM CD) 120 MG 24 hr capsule TAKE 1 CAPSULE BY MOUTH EVERY DAY (FOR FUTURE REFILLS MAKE A DOCTORS VISIT) 90 capsule 3  . fenofibrate (TRICOR) 145 MG tablet TAKE 1 TABLET BY MOUTH EVERY DAY 30 tablet 11  . fluticasone (FLONASE) 50 MCG/ACT nasal spray Place 2 sprays into both nostrils daily. (Patient taking differently: Place 2 sprays into both nostrils daily as needed for allergies. ) 48 g 3  . fluticasone furoate-vilanterol (BREO ELLIPTA) 200-25 MCG/INH AEPB Inhale 1 puff into the lungs daily.    . furosemide (LASIX) 40 MG tablet TAKE 1 TABLET BY MOUTH TWICE A DAY 180 tablet 3  . gabapentin (NEURONTIN) 100 MG capsule TAKE 1 CAPSULE BY MOUTH THREE TIMES A DAY 270 capsule 0  . glucose blood (BAYER CONTOUR TEST) test strip Use as instructed to check sugar 3 times daily 300 each 5  . HYDROcodone-acetaminophen (NORCO/VICODIN) 5-325 MG tablet Take 1 tablet by mouth every 4 (four) hours as needed for moderate pain. 60 tablet 0  . insulin aspart (NOVOLOG FLEXPEN) 100 UNIT/ML FlexPen Inject 35-40 Units into the skin 3 (three) times daily with meals. 15 pen 5  . Insulin Glargine (TOUJEO SOLOSTAR) 300 UNIT/ML SOPN Inject 40 Units into the skin at bedtime. 5 pen 11  . Insulin Pen Needle 33G X 4 MM MISC 1 each by Does not apply route 4  (four) times daily. 200 each 11  . ipratropium (ATROVENT) 0.06 % nasal spray Place 2 sprays into both nostrils 4 (four) times daily. 15 mL 0  . LORazepam (ATIVAN) 0.5 MG tablet Take 0.5 mg by mouth every 12 (twelve) hours as needed for anxiety.    . metFORMIN (GLUCOPHAGE) 1000 MG tablet TAKE 1 TABLET BY MOUTH TWICE A DAY WITH A MEAL 180 tablet 0  . nitroGLYCERIN (NITROSTAT) 0.4 MG SL tablet Place 1 tablet (0.4 mg total) under the tongue every 5 (five) minutes as needed  for chest pain. Reported on 01/05/2016 25 tablet 6  . ondansetron (ZOFRAN-ODT) 4 MG disintegrating tablet Take 1 tablet (4 mg total) by mouth every 8 (eight) hours as needed for nausea or vomiting. 20 tablet 1  . PARoxetine Mesylate (BRISDELLE) 7.5 MG CAPS Take 7.5 mg by mouth daily.    . Potassium Chloride ER 20 MEQ TBCR TAKE ONE AND ONE-HALF TABLETS BY MOUTH 2 TIMES DAILY 270 tablet 2  . predniSONE (DELTASONE) 5 MG tablet Take 5 mg by mouth daily with breakfast.    . senna (SENOKOT) 8.6 MG TABS tablet Take 1 tablet (8.6 mg total) by mouth daily as needed for mild constipation or moderate constipation. 120 each 0  . telmisartan (MICARDIS) 20 MG tablet TAKE 1 TABLET BY MOUTH EVERY DAY 90 tablet 2  . triamcinolone cream (KENALOG) 0.1 % Apply 1 application topically 2 (two) times daily as needed (for rash. stop after 7-10 days.). 80 g 0  . venlafaxine XR (EFFEXOR-XR) 75 MG 24 hr capsule Take 1 capsule (75 mg total) by mouth 2 (two) times daily.    [name] amoxicillin-clavulanate (AUGMENTIN) 875-125 MG tablet Take 1 tablet by mouth 2 (two) times daily for 7 days. 14 tablet 0   No current facility-administered medications for this visit.     Objective: BP (!) 146/70 (BP Location: Left Arm, Cuff Size: Large)   Pulse (!) 103   Temp 99.3 F (37.4 C) (Oral)   Ht 5\' 6"  (1.676 m)   Wt 247 lb 6.4 oz (112.2 kg)   SpO2 96%   BMI 39.93 kg/m  Gen: NAD, resting comfortably HEENT: Turbinates erythematous with yellow drainage, TM normal, pharynx  mildly erythematous with no tonsilar exudate or edema, frontal and maxillary sinus tenderness, dry mucus membranes CV: regular but slightly tachycardic no murmurs rubs or gallops Lungs: CTAB no crackles, wheeze, rhonchi Abdomen: soft/nontender/nondistended/normal bowel sounds. No rebound or guarding.  Ext: no edema Skin: warm, dry, no rash Neuro: grossly normal, moves all extremities  Assessment/Plan:  Sinsusitis Bacterial based on: Symptoms >10 days, double sickening, or severe symptoms in first 3 days  She also has viral gastroenteritis  Though so I asked her to hold off on taking augmentin until she is at least 24 hour diarrhea free. Would have considered c diff if diarrhea had persisted as high a level as yesterday at 15 a day but minimal x2 diarrhea today  Finally she has a rash on her right thigh that hydrocortisone is not helping. She wonders about rubbing against something but she is not sure what. willt rial triamcinolone for potential contact dermatitis- should return if fails to improve.   Finally, we reviewed reasons to return to care including if symptoms worsen or persist or new concerns arise (particularly fever or shortness of breath)  Meds ordered this encounter  Medications  . amoxicillin-clavulanate (AUGMENTIN) 875-125 MG tablet    Sig: Take 1 tablet by mouth 2 (two) times daily for 7 days.    Dispense:  14 tablet    Refill:  0  . triamcinolone cream (KENALOG) 0.1 %    Sig: Apply 1 application topically 2 (two) times daily as needed (for rash. stop after 7-10 days.).    Dispense:  80 g    Refill:  0    Garret Reddish, MD

## 2018-05-18 NOTE — Patient Instructions (Addendum)
Health Maintenance Due  Topic Date Due  . INFLUENZA VACCINE- schedule for this Fall 05/11/2018   Sounds like another sinus infection and unluckily you have gastroenteritis/stomach bug as well. Try to hold off on augmentin until 24 hours after last diarrhea as augmentin can cause diarrhea and could confuse the picture.   Try cream for rash. If this worsens or fails to improve see [place] back

## 2018-05-22 ENCOUNTER — Encounter: Payer: Self-pay | Admitting: Family Medicine

## 2018-05-22 ENCOUNTER — Ambulatory Visit (INDEPENDENT_AMBULATORY_CARE_PROVIDER_SITE_OTHER): Payer: BLUE CROSS/BLUE SHIELD | Admitting: Family Medicine

## 2018-05-22 VITALS — BP 118/76 | HR 71 | Temp 98.7°F | Ht 66.0 in | Wt 253.4 lb

## 2018-05-22 DIAGNOSIS — L0291 Cutaneous abscess, unspecified: Secondary | ICD-10-CM | POA: Diagnosis not present

## 2018-05-22 MED ORDER — SULFAMETHOXAZOLE-TRIMETHOPRIM 800-160 MG PO TABS: 1.0000 | ORAL_TABLET | Freq: Two times a day (BID) | ORAL | 0 refills | Status: DC

## 2018-05-22 NOTE — Progress Notes (Signed)
Patient: [name] MRN: [id number] DOB: 1961-02-16 PCP: [name], MD     Subjective:  Chief Complaint  Patient presents with  . spot on lower left leg    h/o MRSA    HPI: The patient is a 57 y.o. female who presents today for spot on lower left leg.  H/O MRSA. She states she had a spot that popped up today. She woke up this am with it. It is located on her left anterior lower leg. She is leaving to go on vacation on Saturday and wanted to get it checked out. She states area hurts and feels swollen. No itching or burning. Denies getting bit by bug or any trauma to area.   Review of Systems  Constitutional: Negative for chills and fever.  Respiratory: Negative for shortness of breath.   Cardiovascular: Positive for leg swelling. Negative for chest pain.  Musculoskeletal: Positive for myalgias.       C/o pain left lower leg  Skin: Positive for wound.       Dime-quarter sized red spot on left lower leg.  Tender to touch  Neurological: Negative for numbness.    Allergies Patient is allergic to methotrexate; vancomycin; lisinopril; chlorhexidine; clindamycin/lincomycin; doxycycline; and teflaro [ceftaroline].  Past Medical History Patient  has a past medical history of Abnormal SPEP (04/17/2014), Acute left ankle pain (01/26/2017), ANEMIA-UNSPECIFIED (09/18/2009), Anxiety, Arthritis, Chronic diastolic heart failure, NYHA class 2 ([place]), COPD (chronic obstructive pulmonary disease) ([place]), Depression, DIABETES MELLITUS, TYPE II (08/21/2006), Diabetic osteomyelitis ([place]) (05/29/2015), Fracture of 5th metatarsal, GERD (08/21/2006), GOUT (95/63/8756), umbilical hernia repair, HYPERLIPIDEMIA (08/21/2006), HYPERTENSION (08/21/2006), Infection of wound due to methicillin resistant Staphylococcus aureus (MRSA), Internal hemorrhoids, Multiple allergies (10/14/2016), OBESITY (06/04/2009), Onychomycosis (10/27/2015), Osteomyelitis of left foot ([place]) (05/29/2015), Pulmonary sarcoidosis ([place]), Right  knee pain (01/26/2017), Vocal cord dysfunction, and Wears partial dentures.  Surgical History Patient  has a past surgical history that includes Ventral hernia repair; Nissen fundoplication (4332); Cholecystectomy (1984); Abdominal hysterectomy; Knee arthroscopy; Tubal ligation; Bladder suspension (11/11/2011); Cystoscopy (11/11/2011); CAROTIDS (02/18/11); Right and left CARDIAC CATHETERIZATION (04/23/2013); Metatarsal osteotomy with open reduction, internal fixation (orif) metatarsal with fusion (Left, 04/09/2014); left and right heart catheterization with coronary angiogram (N/A, 04/23/2013); Hernia repair; Appendectomy; Fracture surgery; I&D extremity (Left, 06/27/2015); LEFT HEART CATH AND CORONARY ANGIOGRAPHY (N/A, 03/11/2017); Total knee arthroplasty (Right, 06/29/2017); Tracheostomy tube placement (N/A, 09/20/2017); Fiberoptic laryngoscopy and tracheoscopy (N/A, 09/23/2017); Extubation (endotracheal) in or (N/A, 09/23/2017); Total knee arthroplasty (Left, 12/07/2017); Total knee arthroplasty (Left, 12/07/2017); LEFT HEART CATH AND CORONARY ANGIOGRAPHY (07/20/2010); NM MYOVIEW LTD (03/09/2013); NM MYOVIEW LTD (03/10/2017); and transthoracic echocardiogram (08/2014).  Family History Pateint's family history includes Asthma in her mother; Breast cancer in her mother; COPD in her mother; Coronary artery disease in her father; Diabetes in her brother and father; Emphysema in her mother; Heart attack in her maternal grandfather; Heart attack (age of onset: 47) in her father; Heart failure in her father and mother; Lung cancer in her brother; Sarcoidosis in her maternal uncle; Throat cancer in her father.  Social History Patient  reports that she has never smoked. She has never used smokeless tobacco. She reports that she does not drink alcohol or use drugs.    Objective: Vitals:   05/22/18 1458  BP: 118/76  Pulse: 71  Temp: 98.7 F (37.1 C)  TempSrc: Oral  SpO2: 96%  Weight: 253 lb 6.4 oz (114.9 kg)   Height: 5\' 6"  (1.676 m)    Body mass index is 40.9 kg/m.  Physical Exam  Constitutional: She appears well-developed and well-nourished.  Skin:  Left anterior shin: 1cm area of induration that is round, erythematous. No open wound.   Vitals reviewed.      Assessment/plan: 1. Abscess Very small indurated area. Warm compresses TID and will do course of bactrim with MRSA hx and diabetes. No hx of CKD, had slight bump in July due to dehydration, but repeat was normal. Has tolerated bactrim well in the past. Also can start her bactroban TID, but no open wound to area. Precautions given.     Return if symptoms worsen or fail to improve.     Orma Flaming, MD [place]   05/22/2018

## 2018-05-26 ENCOUNTER — Other Ambulatory Visit: Payer: Self-pay

## 2018-05-26 MED ORDER — FLUTICASONE PROPIONATE 50 MCG/ACT NA SUSP: 2.0000 | Freq: Every day | NASAL | 3 refills | Status: DC | PRN

## 2018-06-08 ENCOUNTER — Encounter: Payer: Self-pay | Admitting: Podiatry

## 2018-06-08 ENCOUNTER — Ambulatory Visit (INDEPENDENT_AMBULATORY_CARE_PROVIDER_SITE_OTHER): Payer: BLUE CROSS/BLUE SHIELD | Admitting: Podiatry

## 2018-06-08 DIAGNOSIS — L03032 Cellulitis of left toe: Secondary | ICD-10-CM | POA: Diagnosis not present

## 2018-06-08 MED ORDER — MUPIROCIN 2 % EX OINT: TOPICAL_OINTMENT | CUTANEOUS | 1 refills | Status: DC

## 2018-06-08 NOTE — Progress Notes (Signed)
She presents today states that she went to have her toenails cut and they cut her.  She states that she is a diabetic and she is concerned about infection plus she has a history of MRSA.  She denies fever chills nausea vomiting muscle aches pains calf pain back pain chest pain shortness of breath.  Objective: Vital signs are stable she is alert and oriented x3 pulses are strong and palpable.  Neurologic sensorium is i diminished per Semmes Weinstein monofilament.  No open lesions or wounds.  Though she does have a very thick skin fissure in the distal aspect of her second toe which is a hammertoe deformity.  She also has a toenail that appears to be growing vertical.  Assessment: Painful toenail with fissure second digit left foot.  Plan: Total nail avulsion was performed today after 2 cc of a 50-50 mixture of Marcaine plain lidocaine plain was infiltrated.  The nail was removed and the excessive skin was resected.  She was placed her on Bactrim DS which she will take twice daily and she will start soaking twice daily and applying Cortisporin Otic.  I will follow-up with her in 1 to 2 weeks should she develop any redness or drainage she will notify us immediately.

## 2018-06-08 NOTE — Patient Instructions (Signed)
Betadine Soak Instructions  Purchase an 8 oz. bottle of BETADINE solution (Povidone)  THE DAY AFTER THE PROCEDURE  Place 1 tablespoon of betadine solution in a quart of warm tap water.  Submerge your foot or feet with outer bandage intact for the initial soak; this will allow the bandage to become moist and wet for easy lift off.  Once you remove your bandage, continue to soak in the solution for 20 minutes.  This soak should be done twice a day.  Next, remove your foot or feet from solution, blot dry the affected area and cover.  You may use a band aid large enough to cover the area or use gauze and tape.  Apply other medications to the area as directed by the doctor such as cortisporin otic solution (ear drops) or neosporin.  IF YOUR SKIN BECOMES IRRITATED WHILE USING THESE INSTRUCTIONS, IT IS OKAY TO SWITCH TO EPSOM SALTS AND WATER OR WHITE VINEGAR AND WATER. 

## 2018-06-09 ENCOUNTER — Other Ambulatory Visit: Payer: Self-pay | Admitting: Pulmonary Disease

## 2018-06-09 ENCOUNTER — Telehealth: Payer: Self-pay | Admitting: Podiatry

## 2018-06-09 DIAGNOSIS — E119 Type 2 diabetes mellitus without complications: Secondary | ICD-10-CM | POA: Diagnosis not present

## 2018-06-09 DIAGNOSIS — Z794 Long term (current) use of insulin: Secondary | ICD-10-CM | POA: Diagnosis not present

## 2018-06-09 MED ORDER — SULFAMETHOXAZOLE-TRIMETHOPRIM 800-160 MG PO TABS: 1.0000 | ORAL_TABLET | Freq: Two times a day (BID) | ORAL | 0 refills | Status: DC

## 2018-06-09 NOTE — Telephone Encounter (Signed)
Pt was wondering if she was suppose to receive an anti-biotic yesterday when she came in for her appointment. She is prone to getting MERSA. Please give ger a call back.

## 2018-06-09 NOTE — Addendum Note (Signed)
Addended by: Harriett Sine D on: 06/09/2018 03:01 PM   Modules accepted: Orders

## 2018-06-09 NOTE — Telephone Encounter (Signed)
I informed pt that I had reviewed pt's clinicals and she was to receive an oral antibiotic and I had sent it to the [name] on [address]. Pt thanked me.

## 2018-06-20 ENCOUNTER — Encounter: Payer: Self-pay | Admitting: Internal Medicine

## 2018-06-20 ENCOUNTER — Ambulatory Visit (AMBULATORY_SURGERY_CENTER): Payer: BLUE CROSS/BLUE SHIELD | Admitting: Internal Medicine

## 2018-06-20 VITALS — BP 126/62 | HR 69 | Temp 98.6°F | Resp 15 | Ht 66.0 in | Wt 253.0 lb

## 2018-06-20 DIAGNOSIS — R1310 Dysphagia, unspecified: Secondary | ICD-10-CM | POA: Diagnosis not present

## 2018-06-20 DIAGNOSIS — K219 Gastro-esophageal reflux disease without esophagitis: Secondary | ICD-10-CM

## 2018-06-20 MED ORDER — LINACLOTIDE 290 MCG PO CAPS: 290.0000 ug | ORAL_CAPSULE | Freq: Every day | ORAL | 11 refills | Status: DC

## 2018-06-20 MED ORDER — SODIUM CHLORIDE 0.9 % IV SOLN: 500.0000 mL | Freq: Once | INTRAVENOUS | Status: DC

## 2018-06-20 NOTE — Op Note (Signed)
[place] Patient Name: [name] Procedure Date: 06/20/2018 10:43 AM MRN: [id number] Endoscopist: Docia Chuck. [name] , MD Age: 57 Referring MD:  Date of Birth: 09-Mar-1961 Gender: Female Account #: [id number] Procedure:                Upper GI endoscopy, With [facility] dilation of the                            esophagus?"1 F Indications:              Dysphagia Medicines:                Monitored Anesthesia Care Procedure:                Pre-Anesthesia Assessment:                           - Prior to the procedure, a History and Physical                            was performed, and patient medications and                            allergies were reviewed. The patient's tolerance of                            previous anesthesia was also reviewed. The risks                            and benefits of the procedure and the sedation                            options and risks were discussed with the patient.                            All questions were answered, and informed consent                            was obtained. Prior Anticoagulants: The patient has                            taken no previous anticoagulant or antiplatelet                            agents. ASA Grade Assessment: III - A patient with                            severe systemic disease. After reviewing the risks                            and benefits, the patient was deemed in                            satisfactory condition to undergo the procedure.  After obtaining informed consent, the endoscope was                            passed under direct vision. Throughout the                            procedure, the patient's blood pressure, pulse, and                            oxygen saturations were monitored continuously. The                            Endoscope was introduced through the mouth, and                            advanced to the second part of duodenum. The  upper                            GI endoscopy was accomplished without difficulty.                            The patient tolerated the procedure well. Scope In: Scope Out: Findings:                 The esophagus was normal. The scope was withdrawn.                            Dilation was performed with a Maloney dilator with                            no resistance at 58 Fr.                           The stomach was normal, Save benign fundic gland                            type polyps.                           The examined duodenum was normal.                           The cardia and gastric fundus were normal on                            retroflexion. Complications:            No immediate complications. Estimated Blood Loss:     Estimated blood loss: none. Impression:               - Normal esophagus. Dilated.                           - Otherwise unremarkable exam. Recommendation:           - Patient has a contact number available for  emergencies. The signs and symptoms of potential                            delayed complications were discussed with the                            patient. Return to normal activities tomorrow.                            Written discharge instructions were provided to the                            patient.                           - Post dilation diet.                           - Continue present medications.                           - Prescribed Linzess 290 g; #30; 11 refills                            (patient was given samples in the office for her                            constipation and this helped. She requested a                            prescription) Docia Chuck. [name], MD 06/20/2018 11:04:53 AM This report has been signed electronically.

## 2018-06-20 NOTE — Progress Notes (Signed)
Report to PACU, RN, vss, BBS= Clear.  

## 2018-06-20 NOTE — Progress Notes (Signed)
Pt's states no medical or surgical changes since previsit or office visit. 

## 2018-06-20 NOTE — Progress Notes (Signed)
Called to room to assist during endoscopic procedure.  Patient ID and intended procedure confirmed with present staff. Received instructions for my participation in the procedure from the performing physician.  

## 2018-06-20 NOTE — Patient Instructions (Signed)
YOU HAD AN ENDOSCOPIC PROCEDURE TODAY AT [facility]:   Refer to the procedure report that was given to you for any specific questions about what was found during the examination.  If the procedure report does not answer your questions, please call your gastroenterologist to clarify.  If you requested that your care partner not be given the details of your procedure findings, then the procedure report has been included in a sealed envelope for you to review at your convenience later.  YOU SHOULD EXPECT: Some feelings of bloating in the abdomen. Passage of more gas than usual.  Walking can help get rid of the air that was put into your GI tract during the procedure and reduce the bloating. If you had a lower endoscopy (such as a colonoscopy or flexible sigmoidoscopy) you may notice spotting of blood in your stool or on the toilet paper. If you underwent a bowel prep for your procedure, you may not have a normal bowel movement for a few days.  Please Note:  You might notice some irritation and congestion in your nose or some drainage.  This is from the oxygen used during your procedure.  There is no need for concern and it should clear up in a day or so.  SYMPTOMS TO REPORT IMMEDIATELY:    Following upper endoscopy (EGD)  Vomiting of blood or coffee ground material  New chest pain or pain under the shoulder blades  Painful or persistently difficult swallowing  New shortness of breath  Fever of 100F or higher  Black, tarry-looking stools  For urgent or emergent issues, a gastroenterologist can be reached at any hour by calling [phone].   DIET:  We do recommend a small meal at first, but then you may proceed to your regular diet.  Drink plenty of fluids but you should avoid alcoholic beverages for 24 hours.  ACTIVITY:  You should plan to take it easy for the rest of today and you should NOT DRIVE or use heavy machinery until tomorrow (because of the sedation medicines used  during the test).    FOLLOW UP: Our staff will call the number listed on your records the next business day following your procedure to check on you and address any questions or concerns that you may have regarding the information given to you following your procedure. If we do not reach you, we will leave a message.  However, if you are feeling well and you are not experiencing any problems, there is no need to return our call.  We will assume that you have returned to your regular daily activities without incident.  If any biopsies were taken you will be contacted by phone or by letter within the next 1-3 weeks.  Please call us at [phone] if you have not heard about the biopsies in 3 weeks.    SIGNATURES/CONFIDENTIALITY: You and/or your care partner have signed paperwork which will be entered into your electronic medical record.  These signatures attest to the fact that that the information above on your After Visit Summary has been reviewed and is understood.  Full responsibility of the confidentiality of this discharge information lies with you and/or your care-partner.  Post dilation diet instructions given.  Linzess ordered, follow instructions on bottle.

## 2018-06-21 ENCOUNTER — Telehealth: Payer: Self-pay

## 2018-06-21 NOTE — Telephone Encounter (Signed)
  Follow up Call-  Call back number 06/20/2018  Post procedure Call Back phone  # [phone]  Permission to leave phone message Yes  Some recent data might be hidden     Patient questions:  Do you have a fever, pain , or abdominal swelling? No. Pain Score  0 *  Have you tolerated food without any problems? Yes.    Have you been able to return to your normal activities? Yes.    Do you have any questions about your discharge instructions: Diet   No. Medications  No. Follow up visit  No.  Do you have questions or concerns about your Care? No.  Actions: * If pain score is 4 or above: No action needed, pain <4.

## 2018-06-22 ENCOUNTER — Ambulatory Visit (INDEPENDENT_AMBULATORY_CARE_PROVIDER_SITE_OTHER): Payer: BLUE CROSS/BLUE SHIELD | Admitting: Podiatry

## 2018-06-22 ENCOUNTER — Encounter: Payer: Self-pay | Admitting: Podiatry

## 2018-06-22 ENCOUNTER — Telehealth: Payer: Self-pay | Admitting: *Deleted

## 2018-06-22 DIAGNOSIS — L03032 Cellulitis of left toe: Secondary | ICD-10-CM

## 2018-06-22 IMAGING — CR DG CHEST 2V
2 series · 2 of 2 positions shown · non-contrast
Comparison: 03/09/2017 CXR

CLINICAL DATA: Nonproductive cough x1 week.

EXAM:
CHEST  2 VIEW

[chest lat]
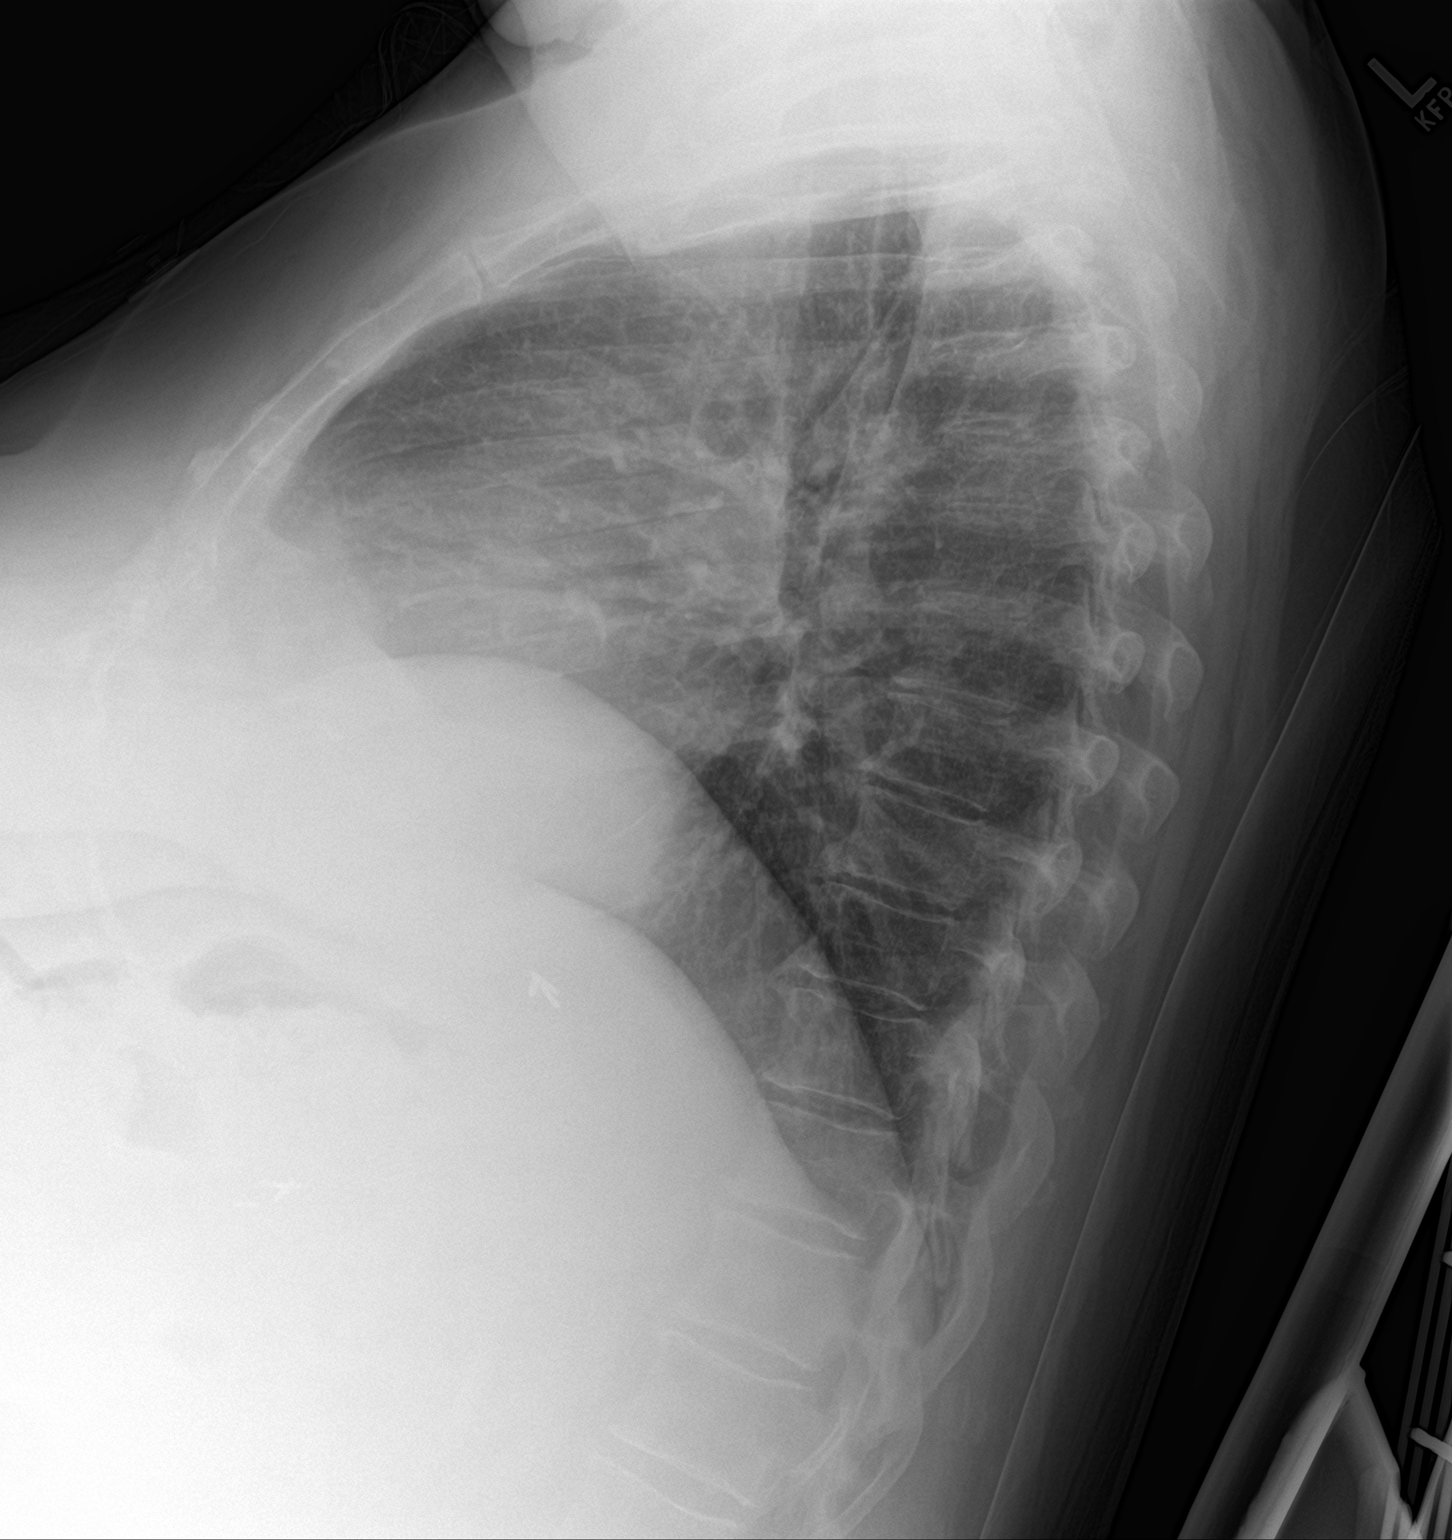

[chest ap]
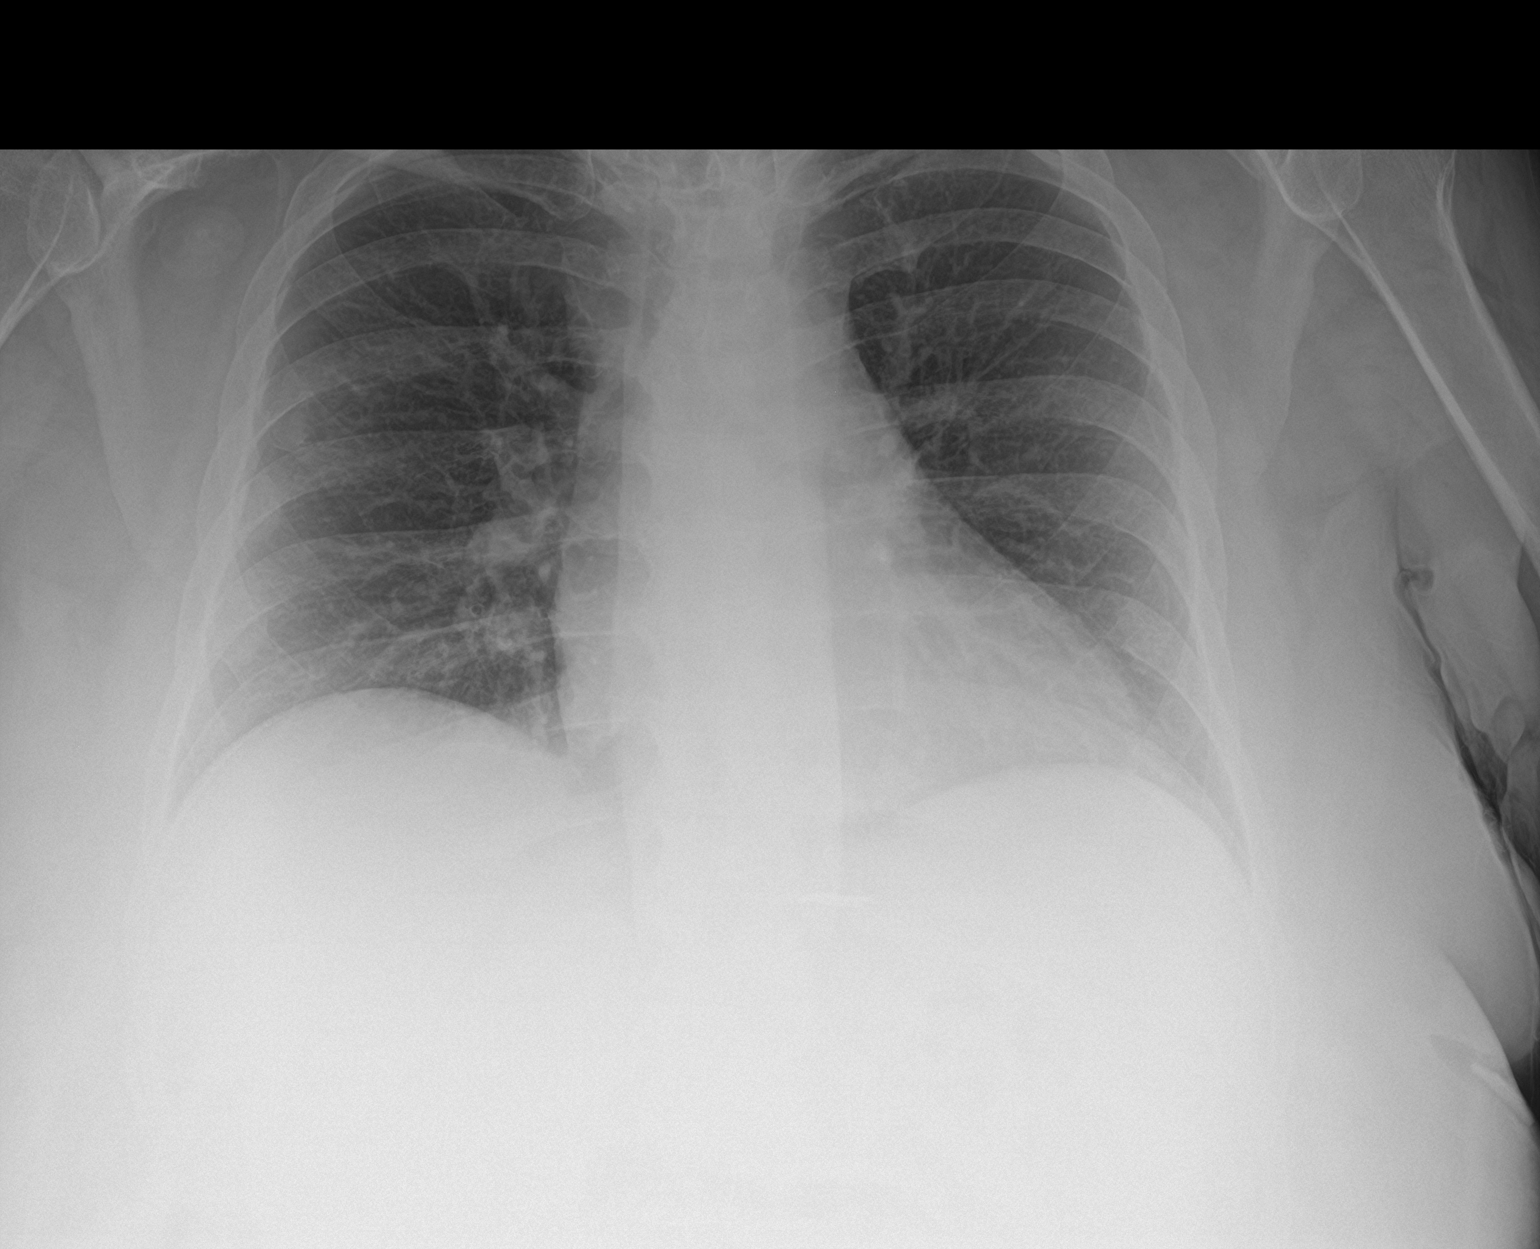

[2 of 2 positions shown; findings below may reference images not displayed]

FINDINGS: The heart size and mediastinal contours are within normal limits.
Both lungs are clear. The visualized skeletal structures are
unremarkable.
IMPRESSION: No active cardiopulmonary disease.

## 2018-06-22 NOTE — Patient Instructions (Addendum)
Betadine Soak Instructions  Purchase an 8 oz. bottle of BETADINE solution (Povidone)  THE DAY AFTER THE PROCEDURE  Place 1 tablespoon of betadine solution in a quart of warm tap water.  Submerge your foot or feet with outer bandage intact for the initial soak; this will allow the bandage to become moist and wet for easy lift off.  Once you remove your bandage, continue to soak in the solution for 20 minutes.  This soak should be done twice a day.  Next, remove your foot or feet from solution, blot dry the affected area and cover.  You may use a band aid large enough to cover the area or use gauze and tape.  Apply other medications to the area as directed by the doctor such as cortisporin otic solution (ear drops) or neosporin.  IF YOUR SKIN BECOMES IRRITATED WHILE USING THESE INSTRUCTIONS, IT IS OKAY TO SWITCH TO EPSOM SALTS AND WATER OR WHITE VINEGAR AND WATER.  Please continue to soak until there is no pain, no redness and no drainage. Bandage on during the day and off at night. Report any signs of symptoms of infection, examples are: increased pain, increased swelling or drainage, increased redness. Please call with any questions or concerns

## 2018-06-22 NOTE — Progress Notes (Signed)
She presents today for follow-up of matrixectomy second digit left foot.  She states that the toes that toe is doing much better however I would like to have the nail removed from the left big toe as well.  Objective: Vital signs are stable alert and oriented x3.  Pulses are palpable.  Second digit appears to be healing very nicely.  She has a thick yellow dystrophic painful hallux nail left.  Assessment: Pulses remain palpable well-healing surgical toe second left painful thickened hallux nail left.  Plan: Discussed etiology pathology conservative or surgical therapies at this point after 3 cc of a 50-50 mixture of Marcaine plain and lidocaine plain was injected in the hallux left I then remove the nail plate and total dressed with a dry sterile compressive dressing was provided with both oral and written home-going instructions we will follow-up with her in 2 weeks.

## 2018-06-22 NOTE — Telephone Encounter (Signed)
Pt states she got home and her shoe is full of blood after the toenail procedure, is that normal. I told pt the blood is from where the procedure is performed and covered with an ointment or cream, then the toe is dressed in a pressure dressing then the tourniquet is release and the foot is put in a shoe and the blood has no place to go other than out. I told pt to add more gauze to the current dressing, rest and elevate and may want to stay out of shoe, or come to our office to have redressed if does not stop bleeding. Pt states she will be coming back in with a friend that will see Dr. Milinda Pointer today, if it does not stop. I told her that would be fine.

## 2018-06-26 ENCOUNTER — Ambulatory Visit (INDEPENDENT_AMBULATORY_CARE_PROVIDER_SITE_OTHER): Payer: BLUE CROSS/BLUE SHIELD | Admitting: Orthopedic Surgery

## 2018-06-26 ENCOUNTER — Encounter (INDEPENDENT_AMBULATORY_CARE_PROVIDER_SITE_OTHER): Payer: Self-pay | Admitting: Orthopedic Surgery

## 2018-06-26 ENCOUNTER — Ambulatory Visit (INDEPENDENT_AMBULATORY_CARE_PROVIDER_SITE_OTHER): Payer: Self-pay

## 2018-06-26 DIAGNOSIS — M25872 Other specified joint disorders, left ankle and foot: Secondary | ICD-10-CM | POA: Diagnosis not present

## 2018-06-26 DIAGNOSIS — M79672 Pain in left foot: Secondary | ICD-10-CM | POA: Diagnosis not present

## 2018-06-26 DIAGNOSIS — Z6841 Body Mass Index (BMI) 40.0 and over, adult: Secondary | ICD-10-CM

## 2018-06-26 MED ORDER — LIDOCAINE HCL 1 % IJ SOLN
2.0000 mL | INTRAMUSCULAR | Status: AC | PRN
  Administered 2018-06-26: 2 mL

## 2018-06-26 MED ORDER — METHYLPREDNISOLONE ACETATE 40 MG/ML IJ SUSP
40.0000 mg | INTRAMUSCULAR | Status: AC | PRN
  Administered 2018-06-26: 40 mg via INTRA_ARTICULAR

## 2018-06-26 NOTE — Progress Notes (Signed)
Office Visit Note   Patient: [name]           Date of Birth: 1961/01/06           MRN: [id number] Visit Date: 06/26/2018              Requested by: [name], MD [place], [place] [address] PCP: [name], MD  Chief Complaint  Patient presents with  . Left Foot - Pain      HPI: Patient is a 57 year old woman who presents with a one-week history of sharp throbbing pain anteriorly over the ankle.  She states she has been taking Vicodin without relief.  Patient denies any twisting falling injuries.  Assessment & Plan: Visit Diagnoses:  1. Pain in left foot   2. Body mass index 40.0-44.9, adult ([place])   3. Morbid obesity ([place])   4. Impingement of left ankle joint     Plan: Ankle was injected without complications plan to follow-up in 4 weeks.  Follow-Up Instructions: Return in about 4 weeks (around 07/24/2018).   Ortho Exam  Patient is alert, oriented, no adenopathy, well-dressed, normal affect, normal respiratory effort. Examination patient has an antalgic gait she has a good dorsalis pedis and posterior tibial pulse no redness no cellulitis no signs or symptoms of infection or gout.  She is point tender to palpation anteriorly over the ankle.  The tendons are nontender to palpation.  Imaging: Xr Foot 2 Views Left  Result Date: 06/26/2018 2 view radiographs of the left foot and ankle shows stable internal fixation to the foot no evidence of stress fractures.  The tibiotalar joint is congruent.  No images are attached to the encounter.  Labs: Lab Results  Component Value Date   HGBA1C 7.6 (A) 04/11/2018   HGBA1C 8.4 (H) 11/29/2017   HGBA1C 8.7 (H) 09/20/2017   ESRSEDRATE 28 06/16/2017   ESRSEDRATE 5 01/26/2017   ESRSEDRATE 27 10/14/2016   CRP 27.1 (H) 06/16/2017   CRP 17.3 (H) 01/26/2017   CRP 24.0 (H) 10/14/2016   LABURIC 5.1 01/26/2017   REPTSTATUS 09/22/2017 FINAL 09/20/2017   GRAMSTAIN  08/14/2017    MODERATE WBC  PRESENT, PREDOMINANTLY PMN MODERATE BUDDING YEAST SEEN    CULT NO GROUP A STREP (S.PYOGENES) ISOLATED 09/20/2017   LABORGA PSEUDOMONAS FLUORESCENS 08/14/2017     Lab Results  Component Value Date   ALBUMIN 4.4 04/05/2018   ALBUMIN 4.2 02/08/2018   ALBUMIN 4.0 12/22/2017   LABURIC 5.1 01/26/2017    There is no height or weight on file to calculate BMI.  Orders:  Orders Placed This Encounter  Procedures  . XR Foot 2 Views Left   No orders of the defined types were placed in this encounter.    Procedures: Medium Joint Inj: L ankle on 06/26/2018 2:31 PM Indications: pain and diagnostic evaluation Details: 22 G 1.5 in needle, anteromedial approach Medications: 2 mL lidocaine 1 %; 40 mg methylPREDNISolone acetate 40 MG/ML Outcome: tolerated well, no immediate complications Procedure, treatment alternatives, risks and benefits explained, specific risks discussed. Consent was given by the patient. Immediately prior to procedure a time out was called to verify the correct patient, procedure, equipment, support staff and site/side marked as required. Patient was prepped and draped in the usual sterile fashion.      Clinical Data: No additional findings.  ROS:  All other systems negative, except as noted in the HPI. Review of Systems  Objective: Vital Signs: There were no vitals  taken for this visit.  Specialty Comments:  No specialty comments available.  PMFS History: Patient Active Problem List   Diagnosis Date Noted  . Impingement of left ankle joint 06/26/2018  . Constipation 04/21/2018  . Anxiety 04/05/2018  . Total knee replacement status, left 12/07/2017  . Unilateral primary osteoarthritis, left knee 11/07/2017  . Dysphagia 09/20/2017  . Epiglottitis   . Plantar fasciitis, right 07/13/2017  . S/P total knee arthroplasty, right 06/29/2017  . Osteoarthrosis, localized, primary, knee, right   . Sprain of calcaneofibular ligament of right ankle 06/06/2017  .  Osteoarthritis of right knee 01/26/2017  . Multiple allergies 10/14/2016  . Onychomycosis 10/27/2015  . CKD (chronic kidney disease) stage 3, GFR 30-59 ml/min (HCC) 04/27/2015  . MRSA (methicillin resistant staph aureus) culture positive 03/27/2015  . Wound infection complicating hardware ([place]) 03/27/2015  . Fatty liver 09/30/2014  . Hot flashes 07/15/2014  . Abnormal SPEP 04/17/2014  . Fracture of left leg 04/17/2014  . Cushingoid side effect of steroids ([place]) 04/17/2014  . Internal hemorrhoids   . Depression   . Preoperative clearance 03/25/2014  . Obstructive chronic bronchitis without exacerbation ([place]) 09/18/2013  . Chest pain 04/11/2013  . Hypertensive heart disease with chronic diastolic congestive heart failure ([place])   . Solitary pulmonary nodule, on CT 02/2013 - stable over 2 years in 2015 02/20/2013  . Gout 08/20/2010  . Anemia 09/18/2009  . Morbid obesity ([place]) 06/04/2009  . Sleep apnea 04/21/2009  . Sarcoidosis of lung ([place]) 04/10/2007  . Hyperlipidemia 08/21/2006  . Essential hypertension 08/21/2006  . GERD 08/21/2006  . Type II diabetes mellitus with renal manifestations ([place]) 08/21/2006   Past Medical History:  Diagnosis Date  . Abnormal SPEP 04/17/2014  . Acute left ankle pain 01/26/2017  . ANEMIA-UNSPECIFIED 09/18/2009  . Anxiety   . Arthritis   . Chronic diastolic heart failure, NYHA class 2 (HCC)    Normal LVEDP by May 2018  . COPD (chronic obstructive pulmonary disease) ([place])   . Depression   . DIABETES MELLITUS, TYPE II 08/21/2006  . Diabetic osteomyelitis ([place]) 05/29/2015  . Fracture of 5th metatarsal    non union  . GERD 08/21/2006  . GOUT 08/20/2010  . Hx of umbilical hernia repair   . HYPERLIPIDEMIA 08/21/2006  . HYPERTENSION 08/21/2006  . Infection of wound due to methicillin resistant Staphylococcus aureus (MRSA)   . Internal hemorrhoids   . Multiple allergies 10/14/2016  . OBESITY 06/04/2009  . Onychomycosis 10/27/2015  . Osteomyelitis of left  foot ([place]) 05/29/2015  . Pulmonary sarcoidosis ([place])    Followed locally by pulmonology, but also [facility] Casper Harrison [facility]  . Right knee pain 01/26/2017  . Vocal cord dysfunction   . Wears partial dentures     Family History  Problem Relation Age of Onset  . Diabetes Father   . Heart attack Father 101  . Coronary artery disease Father   . Heart failure Father   . Throat cancer Father   . COPD Mother   . Emphysema Mother   . Asthma Mother   . Heart failure Mother   . Breast cancer Mother   . Heart attack Maternal Grandfather   . Sarcoidosis Maternal Uncle   . Lung cancer Brother   . Diabetes Brother   . Colon cancer Neg Hx   . Rectal cancer Neg Hx     Past Surgical History:  Procedure Laterality Date  . ABDOMINAL HYSTERECTOMY    . APPENDECTOMY    .  BLADDER SUSPENSION  11/11/2011   Procedure: TRANSVAGINAL TAPE (TVT) PROCEDURE;  Surgeon: Olga Millers, MD;  Location: [facility];  Service: Gynecology;  Laterality: N/A;  . CAROTIDS  02/18/11   CAROTID DUPLEX; VERTEBRALS ARE PATENT WITH ANTEGRADE FLOW. ICA/CCA RATIO 1.61 ON RIGHT AND 0.75 ON LEFT  . CHOLECYSTECTOMY  1984  . CYSTOSCOPY  11/11/2011   Procedure: CYSTOSCOPY;  Surgeon: Olga Millers, MD;  Location: [facility];  Service: Gynecology;  Laterality: N/A;  . EXTUBATION (ENDOTRACHEAL) IN OR N/A 09/23/2017   Procedure: EXTUBATION (ENDOTRACHEAL) IN OR;  Surgeon: Helayne Seminole, MD;  Location: [facility];  Service: ENT;  Laterality: N/A;  . FIBEROPTIC LARYNGOSCOPY AND TRACHEOSCOPY N/A 09/23/2017   Procedure: FLEXIBLE FIBEROPTIC LARYNGOSCOPY;  Surgeon: Helayne Seminole, MD;  Location: [facility];  Service: ENT;  Laterality: N/A;  . FRACTURE SURGERY     foot  . HERNIA REPAIR    . I&D EXTREMITY Left 06/27/2015   Procedure: Partial Excision Left Calcaneus, Place Antibiotic Beads, and Wound VAC;  Surgeon: Newt Minion, MD;  Location: [facility];  Service: Orthopedics;  Laterality: Left;  . KNEE ARTHROSCOPY     right  . LEFT  AND RIGHT HEART CATHETERIZATION WITH CORONARY ANGIOGRAM N/A 04/23/2013   Procedure: LEFT AND RIGHT HEART CATHETERIZATION WITH CORONARY ANGIOGRAM;  Surgeon: Leonie Man, MD;  Location: [facility];  Service: Cardiovascular;  Laterality: N/A;  . LEFT HEART CATH AND CORONARY ANGIOGRAPHY N/A 03/11/2017   Procedure: Left Heart Cath and Coronary Angiography;  Surgeon: Sherren Mocha, MD;  Location: [facility]; angiographically minimal CAD in the LAD otherwise normal.;  Normal LVEDP.  FALSE POSITIVE MYOVIEW  . LEFT HEART CATH AND CORONARY ANGIOGRAPHY  07/20/2010   LVEF 50-55% WITH VERY MILD GLOBAL HYPOKINESIA; ESSENTIALLY NORMAL CORONARY ARTERIES; NORMAL LV FUNCTION  . METATARSAL OSTEOTOMY WITH OPEN REDUCTION INTERNAL FIXATION (ORIF) METATARSAL WITH FUSION Left 04/09/2014   Procedure: LEFT FOOT FRACTURE OPEN TREATMENT METATARSAL INCLUDES INTERNAL FIXATION EACH;  Surgeon: Lorn Junes, MD;  Location: [facility];  Service: Orthopedics;  Laterality: Left;  . NISSEN FUNDOPLICATION  2831  . NM MYOVIEW LTD  03/09/2013   Lexiscan --> EF 50%; NORMAL MYOCARDIAL PERFUSION STUDY - breast attenuation  . NM MYOVIEW LTD  03/10/2017   : Moderate size "stress-induced "perfusion defect at the apex as well as "ill-defined stress-induced perfusion defect in the lateral wall.  EF 55%.  INTERMEDIATE risk. -->  FALSE POSITIVE  . Right and left CARDIAC CATHETERIZATION  04/23/2013   Angiographic normal coronaries; LVEDP 20 mmHg, PCWP 12-14 mmHg, RAP 12 mmHg.; Fick CO/CI 4.9/2.2  . TOTAL KNEE ARTHROPLASTY Right 06/29/2017   Procedure: RIGHT TOTAL KNEE ARTHROPLASTY;  Surgeon: Newt Minion, MD;  Location: [facility];  Service: Orthopedics;  Laterality: Right;  . TOTAL KNEE ARTHROPLASTY Left 12/07/2017  . TOTAL KNEE ARTHROPLASTY Left 12/07/2017   Procedure: LEFT TOTAL KNEE ARTHROPLASTY;  Surgeon: Newt Minion, MD;  Location: [facility];  Service: Orthopedics;  Laterality: Left;  . TRACHEOSTOMY TUBE PLACEMENT N/A  09/20/2017   Procedure: AWAKE INTUBATION WITH ANESTHESIA WITH VIDEO ASSISTANCE;  Surgeon: Helayne Seminole, MD;  Location: [facility];  Service: ENT;  Laterality: N/A;  . TRANSTHORACIC ECHOCARDIOGRAM  08/2014   Normal LV size and function.  Mild LVH.  EF 55-60%.  Normal regional wall motion.  GR 1 DD.  Normal RV size and function .  [name] TUBAL LIGATION     with reversal in 1994  .  VENTRAL HERNIA REPAIR     Social History   Occupational History  . Occupation: DISABLED  Tobacco Use  . Smoking status: Never Smoker  . Smokeless tobacco: Never Used  Substance and Sexual Activity  . Alcohol use: No    Alcohol/week: 0.0 standard drinks  . Drug use: No  . Sexual activity: Yes    Birth control/protection: Surgical

## 2018-06-29 ENCOUNTER — Ambulatory Visit (INDEPENDENT_AMBULATORY_CARE_PROVIDER_SITE_OTHER): Payer: BLUE CROSS/BLUE SHIELD

## 2018-06-29 DIAGNOSIS — L03031 Cellulitis of right toe: Secondary | ICD-10-CM

## 2018-06-29 MED ORDER — AMOXICILLIN-POT CLAVULANATE 875-125 MG PO TABS: 1.0000 | ORAL_TABLET | Freq: Two times a day (BID) | ORAL | 0 refills | Status: DC

## 2018-06-29 NOTE — Patient Instructions (Signed)
Epsom Salt Soak Instructions    IF SOAKING IS STILL NECESSARY, START THIS 2 WEEKS AFTER INITIAL PROCEDURE   Place 1/4 cup of epsom salts in a quart of warm tap water.  Soak your foot or feet in the solution for 20 minutes twice a day until you notice the area has dried and a scab has formed. Continue to apply other medications to the area as directed by the doctor such as polysporin, neosporin or cortisporin drops.  IF YOUR SKIN BECOMES IRRITATED WHILE USING THESE INSTRUCTIONS, IT IS OKAY TO SWITCH TO  WHITE VINEGAR AND WATER. Or you may use antibacterial soap and water to keep the toe clean  Monitor for any signs/symptoms of infection. Call the office immediately if any occur or go directly to the emergency room. Call with any questions/concerns.  Please continue to soak until there is no pain, no redness and no drainage. Bandage on during the day and off at night. Report any signs of symptoms of infection, examples are: increased pain, increased swelling or drainage, increased redness. Please call with any questions or concerns  

## 2018-07-03 NOTE — Progress Notes (Signed)
Patient is here for follow-up appointment procedure performed on 06/22/2018, left hallux nail removal.  She states that the area seems to be very tender and sore, but she does continue to soak.  No redness around the nailbed, with some crusting to the nailbed, and serosanguineous drainage.  No malodor, no other signs and symptoms of infection.      Picture was obtained and was discussed with Dr. Milinda Pointer, per Dr. Milinda Pointer patient will be put on Augmentin for 10 days.  Patient has an appointment to follow-up with Dr. Milinda Pointer next week.  Verbal and written instructions were dispensed.  Patient is to report any acute symptom changes immediately.

## 2018-07-05 ENCOUNTER — Other Ambulatory Visit: Payer: Self-pay | Admitting: Pulmonary Disease

## 2018-07-05 ENCOUNTER — Other Ambulatory Visit: Payer: Self-pay | Admitting: Endocrinology

## 2018-07-06 ENCOUNTER — Ambulatory Visit: Payer: BLUE CROSS/BLUE SHIELD | Admitting: Podiatry

## 2018-07-09 ENCOUNTER — Other Ambulatory Visit: Payer: Self-pay | Admitting: Endocrinology

## 2018-07-09 NOTE — Telephone Encounter (Signed)
Please refer request to PCP 

## 2018-07-11 ENCOUNTER — Ambulatory Visit (INDEPENDENT_AMBULATORY_CARE_PROVIDER_SITE_OTHER): Payer: BLUE CROSS/BLUE SHIELD | Admitting: Podiatry

## 2018-07-11 ENCOUNTER — Telehealth: Payer: Self-pay | Admitting: Cardiology

## 2018-07-11 ENCOUNTER — Encounter: Payer: Self-pay | Admitting: Podiatry

## 2018-07-11 DIAGNOSIS — L600 Ingrowing nail: Secondary | ICD-10-CM

## 2018-07-11 MED ORDER — NEOMYCIN-POLYMYXIN-HC 1 % OT SOLN: OTIC | 1 refills | Status: DC

## 2018-07-11 NOTE — Patient Instructions (Signed)

## 2018-07-11 NOTE — Telephone Encounter (Signed)
° °  Spouse calling to report chest discomfort, back pain, left arm pain.    1. Are you having CP right now? NO  2. Are you experiencing any other symptoms (ex. SOB, nausea, vomiting, sweating)?  A little SOB on exertion  3. How long have you been experiencing CP? 1 week  4. Is your CP continuous or coming and going? Coming and going  5. Have you taken Nitroglycerin? no ?

## 2018-07-11 NOTE — Telephone Encounter (Signed)
CALLED , BOTH NUMBERS AND HUSBAND'S NO ANSWER - LEFT MESSAGE TO CALLBACK. APPOINTMENT SCHEDULE FOR 07/18/18 BY SCHEDULER.

## 2018-07-11 NOTE — Telephone Encounter (Signed)
Spoke with the pt and she agrees to be seen tomorrow with Dr. [name] at 3:40pm. Will call EMS or go to the ER if her symptoms change or worsen. Pt agrees and verbalized understanding.

## 2018-07-11 NOTE — Telephone Encounter (Signed)
Follow Up:   Patient returning a call

## 2018-07-12 ENCOUNTER — Ambulatory Visit (INDEPENDENT_AMBULATORY_CARE_PROVIDER_SITE_OTHER): Payer: BLUE CROSS/BLUE SHIELD | Admitting: Cardiology

## 2018-07-12 ENCOUNTER — Encounter: Payer: Self-pay | Admitting: Cardiology

## 2018-07-12 VITALS — BP 140/68 | HR 76 | Ht 66.0 in | Wt 250.4 lb

## 2018-07-12 DIAGNOSIS — I5032 Chronic diastolic (congestive) heart failure: Secondary | ICD-10-CM

## 2018-07-12 DIAGNOSIS — I110 Hypertensive heart disease with heart failure: Secondary | ICD-10-CM | POA: Diagnosis not present

## 2018-07-12 DIAGNOSIS — I6782 Cerebral ischemia: Secondary | ICD-10-CM | POA: Insufficient documentation

## 2018-07-12 DIAGNOSIS — E782 Mixed hyperlipidemia: Secondary | ICD-10-CM | POA: Diagnosis not present

## 2018-07-12 DIAGNOSIS — I10 Essential (primary) hypertension: Secondary | ICD-10-CM | POA: Diagnosis not present

## 2018-07-12 DIAGNOSIS — R002 Palpitations: Secondary | ICD-10-CM | POA: Insufficient documentation

## 2018-07-12 MED ORDER — ISOSORBIDE MONONITRATE ER 30 MG PO TB24: 30.0000 mg | ORAL_TABLET | Freq: Every day | ORAL | 3 refills | Status: DC

## 2018-07-12 NOTE — Progress Notes (Signed)
PCP: [name], MD  Pulmonolgy: Dr. Nori [facility]) ; [place] - Dr. Concepcion Living  Clinic Note: Chief Complaint  Patient presents with  . Shortness of Breath    when active  . Chest Pain    pain in back and left arm  . Follow-up    HPI: [name] is a 57 y.o. female with a PMH below who presents today for 6 month follow-up for her mild diastolic heart failure/hypertensive heart disease with pulmonary sarcoidosis.  --> She has a history of gastric bypass surgery as well as hypertension, hyperlipidemia, diabetes mellitus,-type II and COPD.  -- She was initially seen back in 2014 and had a RIGHT AND LEFT HEART CATH --> angiographic normal coronary arteries with essentially normal right heart pressures..  She had previously had a negative cath in 2011.   Follow-up cath in 2018 -angiographically normal with mild nonobstructive disease in the LAD.  Normal LVEDP.  SHARICE HARRISS was last seen in February 2018, stable from a cardiac standpoint.  No heart failure or angina symptoms.  Intermittent bouts of bronchitis and sarcoid issues.  But nothing from a cardiac standpoint.  Recent Hospitalizations:   Mar 10, 2017: Admitted with left upper chest pressure that was thought to be consistent with anginal symptoms. -->  Myoview ordered.-->  Myoview read as abnormal with apical lateral defect.  EF 55%.  (Intermediate risk)-->  Referred for cardiac catheterization with minimal disease in the LAD only.  Otherwise angiographically normal with normal EDP.  FALSE POSITIVE NUCLEAR STRESS TEST.  December 2018- ICU admission with acute epiglotitis - (fiberoptic) intubated  & IV ABx.  Actually she had tracheostomy.  Both knees arthroplasty  Studies Reviewed:   None  Interval History: [name] returns today a bit earlier than she was supposed to come in because she is just still having frequent episodes of chest pain with and without exertion, but mostly also notes significant exertion with  walking.  She now cannot walk back and forth to the mailbox without stopping it fairly short of breath.  She says is very hard for her to exercise to try to lose weight and help her blood pressure and cholesterol because she is sore and also has dyspnea and chest discomfort.  She has been noticing this discomfort off and on now for about a month. She also has noted that about 2-3 times a week she feels these episodes of very fast heart rate, unnaturally responsive and tachycardic that makes her feel lightheaded and dizzy.  She feels like her heart is racing but seems pretty normal.  This can happen both with rest and exertion.  She does not sleep lying down, therefore is difficult to tell if she truly has orthopnea or PND.  Relatively controlled edema.  No syncope or near syncope, TIA or amaurosis fugax. No melena, hematochezia, hematuria or epistaxis despite nasal congestion.  overall doing well from a cardiac standpoint. I was not aware of the fact that she had just had a heart catheterization this past June for chest pain admission.  She denies having any further chest pain since that time.  She has had several hospitalizations including bilateral knee surgeries as well as a pretty   No claudication  ROS: A comprehensive was performed. Review of Systems  Constitutional: Positive for malaise/fatigue (Now feels a little more under the weather, little more groggy and fatigued.). Negative for chills and fever.  HENT: Negative for congestion and nosebleeds.   Respiratory: Positive for cough (Off  and on) and shortness of breath (Baseline). Negative for sputum production.   Cardiovascular:       Per history of present illness  Gastrointestinal: Negative for abdominal pain, blood in stool and melena.  Genitourinary: Negative for hematuria.  Musculoskeletal: Positive for joint pain (Postop knees are stiff, but not painful).  Skin: Negative for rash.  Neurological: Negative.  Negative for focal  weakness.  Psychiatric/Behavioral: Negative for memory loss. The patient is not nervous/anxious and does not have insomnia.   All other systems reviewed and are negative.   Past Medical History:  Diagnosis Date  . Abnormal SPEP 04/17/2014  . Acute left ankle pain 01/26/2017  . ANEMIA-UNSPECIFIED 09/18/2009  . Anxiety   . Arthritis   . Chronic diastolic heart failure, NYHA class 2 (HCC)    Normal LVEDP by May 2018  . COPD (chronic obstructive pulmonary disease) ([place])   . Depression   . DIABETES MELLITUS, TYPE II 08/21/2006  . Diabetic osteomyelitis ([place]) 05/29/2015  . Fracture of 5th metatarsal    non union  . GERD 08/21/2006  . GOUT 08/20/2010  . Hx of umbilical hernia repair   . HYPERLIPIDEMIA 08/21/2006  . HYPERTENSION 08/21/2006  . Infection of wound due to methicillin resistant Staphylococcus aureus (MRSA)   . Internal hemorrhoids   . Multiple allergies 10/14/2016  . OBESITY 06/04/2009  . Onychomycosis 10/27/2015  . Osteomyelitis of left foot ([place]) 05/29/2015  . Pulmonary sarcoidosis ([place])    Followed locally by pulmonology, but also by Dr. Casper Harrison at [facility]  . Right knee pain 01/26/2017  . Vocal cord dysfunction   . Wears partial dentures     Past Surgical History:  Procedure Laterality Date  . ABDOMINAL HYSTERECTOMY    . APPENDECTOMY    . BLADDER SUSPENSION  11/11/2011   Procedure: TRANSVAGINAL TAPE (TVT) PROCEDURE;  Surgeon: Olga Millers, MD;  Location: [facility];  Service: Gynecology;  Laterality: N/A;  . CAROTIDS  02/18/11   CAROTID DUPLEX; VERTEBRALS ARE PATENT WITH ANTEGRADE FLOW. ICA/CCA RATIO 1.61 ON RIGHT AND 0.75 ON LEFT  . CHOLECYSTECTOMY  1984  . CYSTOSCOPY  11/11/2011   Procedure: CYSTOSCOPY;  Surgeon: Olga Millers, MD;  Location: [facility];  Service: Gynecology;  Laterality: N/A;  . EXTUBATION (ENDOTRACHEAL) IN OR N/A 09/23/2017   Procedure: EXTUBATION (ENDOTRACHEAL) IN OR;  Surgeon: Helayne Seminole, MD;  Location: [place];  Service: ENT;   Laterality: N/A;  . FIBEROPTIC LARYNGOSCOPY AND TRACHEOSCOPY N/A 09/23/2017   Procedure: FLEXIBLE FIBEROPTIC LARYNGOSCOPY;  Surgeon: Helayne Seminole, MD;  Location: [place];  Service: ENT;  Laterality: N/A;  . FRACTURE SURGERY     foot  . HERNIA REPAIR    . I&D EXTREMITY Left 06/27/2015   Procedure: Partial Excision Left Calcaneus, Place Antibiotic Beads, and Wound VAC;  Surgeon: Newt Minion, MD;  Location: [place];  Service: Orthopedics;  Laterality: Left;  . KNEE ARTHROSCOPY     right  . LEFT AND RIGHT HEART CATHETERIZATION WITH CORONARY ANGIOGRAM N/A 04/23/2013   Procedure: LEFT AND RIGHT HEART CATHETERIZATION WITH CORONARY ANGIOGRAM;  Surgeon: Leonie Man, MD;  Location: [facility];  Service: Cardiovascular;  Laterality: N/A;  . LEFT HEART CATH AND CORONARY ANGIOGRAPHY N/A 03/11/2017   Procedure: Left Heart Cath and Coronary Angiography;  Surgeon: Sherren Mocha, MD;  Location: [place] [facility]; angiographically minimal CAD in the LAD otherwise normal.;  Normal LVEDP.  FALSE POSITIVE MYOVIEW  . LEFT HEART CATH AND CORONARY ANGIOGRAPHY  07/20/2010   LVEF 50-55% WITH VERY MILD GLOBAL HYPOKINESIA; ESSENTIALLY NORMAL CORONARY ARTERIES; NORMAL LV FUNCTION  . METATARSAL OSTEOTOMY WITH OPEN REDUCTION INTERNAL FIXATION (ORIF) METATARSAL WITH FUSION Left 04/09/2014   Procedure: LEFT FOOT FRACTURE OPEN TREATMENT METATARSAL INCLUDES INTERNAL FIXATION EACH;  Surgeon: Lorn Junes, MD;  Location: [place];  Service: Orthopedics;  Laterality: Left;  . NISSEN FUNDOPLICATION  6270  . NM MYOVIEW LTD  03/09/2013   Lexiscan --> EF 50%; NORMAL MYOCARDIAL PERFUSION STUDY - breast attenuation  . NM MYOVIEW LTD  03/10/2017   : Moderate size "stress-induced "perfusion defect at the apex as well as "ill-defined stress-induced perfusion defect in the lateral wall.  EF 55%.  INTERMEDIATE risk. -->  FALSE POSITIVE  . Right and left CARDIAC CATHETERIZATION  04/23/2013   Angiographic normal  coronaries; LVEDP 20 mmHg, PCWP 12-14 mmHg, RAP 12 mmHg.; Fick CO/CI 4.9/2.2  . TOTAL KNEE ARTHROPLASTY Right 06/29/2017   Procedure: RIGHT TOTAL KNEE ARTHROPLASTY;  Surgeon: Newt Minion, MD;  Location: [place];  Service: Orthopedics;  Laterality: Right;  . TOTAL KNEE ARTHROPLASTY Left 12/07/2017  . TOTAL KNEE ARTHROPLASTY Left 12/07/2017   Procedure: LEFT TOTAL KNEE ARTHROPLASTY;  Surgeon: Newt Minion, MD;  Location: [place];  Service: Orthopedics;  Laterality: Left;  . TRACHEOSTOMY TUBE PLACEMENT N/A 09/20/2017   Procedure: AWAKE INTUBATION WITH ANESTHESIA WITH VIDEO ASSISTANCE;  Surgeon: Helayne Seminole, MD;  Location: [facility];  Service: ENT;  Laterality: N/A;  . TRANSTHORACIC ECHOCARDIOGRAM  08/2014   Normal LV size and function.  Mild LVH.  EF 55-60%.  Normal regional wall motion.  GR 1 DD.  Normal RV size and function .  [name] TUBAL LIGATION     with reversal in 1994  . VENTRAL HERNIA REPAIR       Current Meds  Medication Sig  . albuterol (PROVENTIL HFA;VENTOLIN HFA) 108 (90 Base) MCG/ACT inhaler Inhale 1-2 puffs into the lungs every 6 (six) hours as needed for wheezing or shortness of breath.  . allopurinol (ZYLOPRIM) 100 MG tablet Take 100 mg by mouth 2 (two) times daily.   [name] aspirin EC 81 MG tablet Take 81 mg by mouth daily.   [name] atorvastatin (LIPITOR) 40 MG tablet TAKE 1 TAB BY MOUTH ONCE DAILY (Patient taking differently: Take 40 mg by mouth once a day)  . BAYER MICROLET LANCETS lancets Use as instructed to check sugar 3 times daily  . buPROPion (WELLBUTRIN XL) 300 MG 24 hr tablet Take 1 tablet (300 mg total) by mouth daily.  . Calcium Citrate 200 MG TABS Take 200 mg by mouth daily.  . carvedilol (COREG) 25 MG tablet TAKE 1 TABLET (25 MG TOTAL) BY MOUTH 2 (TWO) TIMES DAILY WITH A MEAL.  [name] Cholecalciferol (VITAMIN D) 2000 units tablet Take 2,000 Units by mouth daily.  . colestipol (COLESTID) 1 g tablet Take 2 tablets (2 g total) by mouth 2 (two) times daily.  . Cyanocobalamin  (VITAMIN B-12 PO) Take 2,500 mcg by mouth daily.   [name] DEXILANT 60 MG capsule TAKE 1 CAPSULE BY MOUTH EVERY DAY  . dicyclomine (BENTYL) 20 MG tablet Take one tablet 15 minutes before meals  . diltiazem (CARDIZEM CD) 120 MG 24 hr capsule TAKE 1 CAPSULE BY MOUTH EVERY DAY (FOR FUTURE REFILLS MAKE A DOCTORS VISIT)  . fenofibrate (TRICOR) 145 MG tablet TAKE 1 TABLET BY MOUTH EVERY DAY  . fluticasone (FLONASE) 50 MCG/ACT nasal spray Place 2 sprays into both nostrils daily as  needed for allergies.  . fluticasone furoate-vilanterol (BREO ELLIPTA) 200-25 MCG/INH AEPB Inhale 1 puff into the lungs daily.  . furosemide (LASIX) 40 MG tablet TAKE 1 TABLET BY MOUTH TWICE A DAY  . gabapentin (NEURONTIN) 100 MG capsule TAKE 1 CAPSULE BY MOUTH THREE TIMES A DAY  . glucose blood (BAYER CONTOUR TEST) test strip Use as instructed to check sugar 3 times daily  . insulin aspart (NOVOLOG FLEXPEN) 100 UNIT/ML FlexPen Inject 35-40 Units into the skin 3 (three) times daily with meals.  . Insulin Glargine (TOUJEO SOLOSTAR) 300 UNIT/ML SOPN Inject 40 Units into the skin at bedtime.  . Insulin Pen Needle 33G X 4 MM MISC 1 each by Does not apply route 4 (four) times daily.  [name] ipratropium (ATROVENT) 0.06 % nasal spray Place 2 sprays into both nostrils 4 (four) times daily.  [name] KLOR-CON M20 20 MEQ tablet TAKE 1 & 1/2 TABLETS BY MOUTH TWICE A DAY  . linaclotide (LINZESS) 290 MCG CAPS capsule Take 1 capsule (290 mcg total) by mouth daily before breakfast.  . LORazepam (ATIVAN) 0.5 MG tablet Take 0.5 mg by mouth every 12 (twelve) hours as needed for anxiety.  . metFORMIN (GLUCOPHAGE) 1000 MG tablet TAKE 1 TABLET BY MOUTH TWICE A DAY WITH A MEAL  . NEOMYCIN-POLYMYXIN-HYDROCORTISONE (CORTISPORIN) 1 % SOLN OTIC solution Apply 1-2 drops to toe BID after soaking  . nitroGLYCERIN (NITROSTAT) 0.4 MG SL tablet Place 1 tablet (0.4 mg total) under the tongue every 5 (five) minutes as needed for chest pain. Reported on 01/05/2016  . ondansetron  (ZOFRAN-ODT) 4 MG disintegrating tablet Take 1 tablet (4 mg total) by mouth every 8 (eight) hours as needed for nausea or vomiting.  [name] PARoxetine Mesylate (BRISDELLE) 7.5 MG CAPS Take 7.5 mg by mouth daily.  . Potassium Chloride ER 20 MEQ TBCR TAKE ONE AND ONE-HALF TABLETS BY MOUTH 2 TIMES DAILY  . predniSONE (DELTASONE) 5 MG tablet Take 5 mg by mouth daily with breakfast.  . senna (SENOKOT) 8.6 MG TABS tablet Take 1 tablet (8.6 mg total) by mouth daily as needed for mild constipation or moderate constipation.  [name] telmisartan (MICARDIS) 20 MG tablet TAKE 1 TABLET BY MOUTH EVERY DAY  . triamcinolone cream (KENALOG) 0.1 % Apply 1 application topically 2 (two) times daily as needed (for rash. stop after 7-10 days.).  [name] venlafaxine XR (EFFEXOR-XR) 75 MG 24 hr capsule Take 1 capsule (75 mg total) by mouth 2 (two) times daily.  . Vitamin D-Vitamin K (K2 PLUS D3) [phone] MCG-UNIT TABS cholecalciferol (vit D3) 1,000 unit-vitamin K2 (MK4) 100 mcg tablet  Take by oral route.  . [DISCONTINUED] mupirocin ointment (BACTROBAN) 2 % Apply to wound after soaking BID    Allergies  Allergen Reactions  . Methotrexate Other (See Comments)    Peri-oral and buccal lesions  . Vancomycin Other (See Comments)    DOSE RELATED NEPHROTOXICITY  . Lisinopril Cough  . Chlorhexidine Itching  . Clindamycin/Lincomycin Nausea And Vomiting and Rash    RASH > ALLERGY INTOLERANCE > NAUSEA & VOMITING  . Doxycycline Rash  . Teflaro [Ceftaroline] Rash    Tolerates ceftriaxone     / Social History   Tobacco Use  . Smoking status: Never Smoker  . Smokeless tobacco: Never Used  Substance Use Topics  . Alcohol use: No    Alcohol/week: 0.0 standard drinks  . Drug use: No   Social History   Social History Narrative   Married 1994. 2 sons who both live close and  1 grandson.       Disability due to sarcoidosis. 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  Wt Readings from Last 3 Encounters:  07/12/18 250 lb 6.4 oz (113.6 kg)  06/20/18 253 lb (114.8 kg)  05/22/18 253 lb 6.4 oz (114.9 kg)    PHYSICAL EXAM BP 140/68   Pulse 76   Ht 5\' 6"  (1.676 m)   Wt 250 lb 6.4 oz (113.6 kg)   BMI 40.42 kg/m   Physical Exam  Constitutional: She is oriented to person, place, and time. She appears well-developed and well-nourished. No distress.  Essentially morbidly obese with some mild cushingoid features.  Well-groomed.  In good spirits.  HENT:  Head: Normocephalic and atraumatic.  Neck: Normal range of motion. Neck supple. No hepatojugular reflux and no JVD present. Carotid bruit is not present.  Cardiovascular: Normal rate, regular rhythm, normal heart sounds and intact distal pulses. Exam reveals no gallop and no friction rub.  No murmur heard. Pulmonary/Chest: Effort normal. No respiratory distress (Baseline accessory muscle use, but not in any distress.). She exhibits no tenderness.  Generalized diminished breath sounds, likely related to body habitus.  Diffuse crackles but no rales or rhonchi.  Rare expiratory wheeze.  Abdominal: Soft. Bowel sounds are normal. She exhibits no distension. There is no tenderness.  Unable to palpate HSM  Musculoskeletal: Normal range of motion. She exhibits no edema.  Neurological: She is alert and oriented to person, place, and time.  Psychiatric: She has a normal mood and affect. Her behavior is normal. Judgment and thought  content normal.  Nursing note and vitals reviewed.   Adult ECG Report n/a  Other studies Reviewed: Additional studies/ records that were reviewed today include:  Recent Labs:  Checked by PCP  Lab Results  Component Value Date   CHOL 163 03/10/2017   HDL 35 (L) 03/10/2017   LDLCALC 49 03/10/2017   LDLDIRECT 76.0 01/15/2016   TRIG 197 (H) 09/20/2017   CHOLHDL 4.7 03/10/2017    ASSESSMENT / PLAN: Problem List Items Addressed This Visit    Essential hypertension (Chronic)    Borderline elevated pressures today.  Is now on max dose carvedilol.  Is back on ARB (telmisartan/Micardis at 20 mg daily.  Is also on diltiazem for additional rate control and pulmonary pretension.  Plan: Pending results of monitor, will increase diltiazem to 100 mg daily. Next step would be to increase telmisartan to 40 mg daily. Also standing dose of Lasix.  Maintaining relatively euvolemic state.      Relevant Medications   isosorbide mononitrate (IMDUR) 30 MG 24 hr tablet   Hyperlipidemia (Chronic)   Relevant Medications   isosorbide mononitrate (IMDUR) 30 MG 24 hr tablet   Hypertensive heart disease with chronic diastolic congestive heart failure (HCC) (Chronic)    She has now had several negative catheterization so I do not think her chest pain is related to heart artery disease.  Could barely be related to elevated blood pressures and even microvascular ischemia.  Plan: Plan is to continue to titrate up diltiazem and ARB. Continue Lasix with standing dose and as needed by sliding scale. Monitor blood pressure and volume status are stabilized, would expect symptoms to improve.      Relevant Medications   isosorbide mononitrate (IMDUR) 30 MG 24 hr tablet   Other Relevant Orders   EKG 12-Lead (Completed)   Rapid palpitations - Primary    Unfortunately, her palpitation episodes are not happening daily.  The 90 to happen every 1 to 2 days.  As such, would need a 30-day monitor to assess and ensure  that he is not having any significant arrhythmias. Depending on those results, anticipate increasing diltiazem to 180 mg daily.  This will add to both blood pressure control from a global perspective but also rate control.      Relevant Orders   EKG 12-Lead (Completed)   CARDIAC EVENT MONITOR      Current medicines are reviewed at length with the patient today. (+/- concerns) n/a The following changes have been made: n/a  Patient Instructions  MEDICATION INSTRUCTIONS   START IMDUR 30 MG AT BEDTIME DAILY,   SCHEDULE AT [place] [address] Your physician has recommended that you wear an event monitor 30 DAYS. Event monitors are medical devices that record the heart's electrical activity. Doctors most often [place] these monitors to diagnose arrhythmias. Arrhythmias are problems with the speed or rhythm of the heartbeat. The monitor is a small, portable device. You can wear one while you do your normal daily activities. This is usually used to diagnose what is causing palpitations/syncope (passing out).     Your physician wants you to follow-up in 2 MONTHS WITH DR Clay Solum.   If you need a refill on your cardiac medications before your next appointment, please call your pharmacy.      Studies Ordered:   Orders Placed This Encounter  Procedures  . CARDIAC EVENT MONITOR  . EKG 12-Lead      [name], M.D., M.S. Interventional Cardiologist   Pager # [phone] Phone # [phone] [address] [place], [place] [address]

## 2018-07-12 NOTE — Progress Notes (Signed)
She presents today for follow-up of hallux procedure left foot.  She states is doing much better however I think we will have to have something done to the second toe.  It is becoming exquisitely painful and tender particular if you press directly on top of it.  Objective: Vital signs are stable she is alert and oriented x3 hallux and second digit left appear to be healing very nicely.  The second digit right does demonstrate some distal erythema no cellulitis drainage or odor.  Mildly tender on palpation.  Assessment well-healing surgical toe hallux left.  Painful dystrophic ingrown nail second digit right foot.  Plan: Chemical matrixectomy was performed after local anesthesia was administered total nail plate right.  Hallux left she will continue to soak at least once every other day and apply Band-Aid during the daytime and leave open at nighttime.  She was provided both oral and written home-going instructions for the care and soaking of her toe as well as a prescription for Bactroban to apply to the nailbed as it heals.  She will watch for signs symptoms of infection notify me if there are any otherwise I will see her in 1 to 2 weeks.

## 2018-07-12 NOTE — Patient Instructions (Addendum)
MEDICATION INSTRUCTIONS   START IMDUR 30 MG AT BEDTIME DAILY,   SCHEDULE AT [place] [address] Your physician has recommended that you wear an event monitor 30 DAYS. Event monitors are medical devices that record the heart's electrical activity. Doctors most often [place] these monitors to diagnose arrhythmias. Arrhythmias are problems with the speed or rhythm of the heartbeat. The monitor is a small, portable device. You can wear one while you do your normal daily activities. This is usually used to diagnose what is causing palpitations/syncope (passing out).     Your physician wants you to follow-up in 2 MONTHS WITH DR HARDING.   If you need a refill on your cardiac medications before your next appointment, please call your pharmacy.

## 2018-07-12 NOTE — Assessment & Plan Note (Signed)
Cannot exclude: On Beta Blocker, Calcium Channel blocker -- will add Imdur 30 mg

## 2018-07-14 ENCOUNTER — Encounter: Payer: Self-pay | Admitting: Cardiology

## 2018-07-14 ENCOUNTER — Ambulatory Visit (INDEPENDENT_AMBULATORY_CARE_PROVIDER_SITE_OTHER): Payer: BLUE CROSS/BLUE SHIELD | Admitting: Family Medicine

## 2018-07-14 ENCOUNTER — Encounter: Payer: Self-pay | Admitting: Family Medicine

## 2018-07-14 VITALS — BP 122/52 | HR 69 | Temp 98.4°F | Ht 66.0 in | Wt 252.4 lb

## 2018-07-14 DIAGNOSIS — D171 Benign lipomatous neoplasm of skin and subcutaneous tissue of trunk: Secondary | ICD-10-CM

## 2018-07-14 DIAGNOSIS — R109 Unspecified abdominal pain: Secondary | ICD-10-CM | POA: Diagnosis not present

## 2018-07-14 DIAGNOSIS — R1011 Right upper quadrant pain: Secondary | ICD-10-CM | POA: Diagnosis not present

## 2018-07-14 DIAGNOSIS — Z23 Encounter for immunization: Secondary | ICD-10-CM | POA: Diagnosis not present

## 2018-07-14 DIAGNOSIS — M542 Cervicalgia: Secondary | ICD-10-CM

## 2018-07-14 LAB — POC URINALSYSI DIPSTICK (AUTOMATED)
Bilirubin, UA: NEGATIVE
Blood, UA: NEGATIVE
Glucose, UA: NEGATIVE
Ketones, UA: NEGATIVE
Leukocytes, UA: NEGATIVE
Nitrite, UA: NEGATIVE
Protein, UA: NEGATIVE
Spec Grav, UA: 1.02 (ref 1.010–1.025)
Urobilinogen, UA: 0.2 E.U./dL
pH, UA: 5.5 (ref 5.0–8.0)

## 2018-07-14 MED ORDER — METHOCARBAMOL 500 MG PO TABS: 500.0000 mg | ORAL_TABLET | Freq: Four times a day (QID) | ORAL | 0 refills | Status: DC | PRN

## 2018-07-14 NOTE — Patient Instructions (Addendum)
Health Maintenance Due  Topic Date Due  . INFLUENZA VACCINE - today 05/11/2018   See [place] back for worsening abdominal pain or burning with peeing. Urine looked ok today. Stomach looked like it has a lipoma on it- benign fatty tumor- may just be slightly inflamed- if worsens see [place] back.   For the neck- seems really tight- wonder if you slept on it awkwardly one night. Lets try a muscle relaxant- try not to drive for 6 hours after taking. Definitely take one before bed if you can tolerate it. See [place] back for worsening symptoms, could try PT if not improving within a week or so. Also a good massage would probably help

## 2018-07-14 NOTE — Progress Notes (Signed)
Subjective:  [name] is a 57 y.o. year old very pleasant female patient who presents for/with See problem oriented charting ROS- no fever, chills, vomiting. Some RUQ pain. Some dysuria.    Past Medical History-  Patient Active Problem List   Diagnosis Date Noted  . Constipation 04/21/2018    Priority: High  . Obstructive chronic bronchitis without exacerbation ([place]) 09/18/2013    Priority: High  . Chest pain 04/11/2013    Priority: High  . Hypertensive heart disease with chronic diastolic congestive heart failure ([place])     Priority: High  . Sarcoidosis of lung ([place]) 04/10/2007    Priority: High  . Type II diabetes mellitus with renal manifestations ([place]) 08/21/2006    Priority: High  . Epiglottitis     Priority: Medium  . Osteoarthritis of right knee 01/26/2017    Priority: Medium  . CKD (chronic kidney disease) stage 3, GFR 30-59 ml/min ([facility]) 04/27/2015    Priority: Medium  . Fatty liver 09/30/2014    Priority: Medium  . Depression     Priority: Medium  . Gout 08/20/2010    Priority: Medium  . Anemia 09/18/2009    Priority: Medium  . Sleep apnea 04/21/2009    Priority: Medium  . Hyperlipidemia 08/21/2006    Priority: Medium  . Essential hypertension 08/21/2006    Priority: Medium  . Dysphagia 09/20/2017    Priority: Low  . Plantar fasciitis, right 07/13/2017    Priority: Low  . Osteoarthrosis, localized, primary, knee, right     Priority: Low  . Sprain of calcaneofibular ligament of right ankle 06/06/2017    Priority: Low  . Onychomycosis 10/27/2015    Priority: Low  . MRSA (methicillin resistant staph aureus) culture positive 03/27/2015    Priority: Low  . Hot flashes 07/15/2014    Priority: Low  . Abnormal SPEP 04/17/2014    Priority: Low  . Fracture of left leg 04/17/2014    Priority: Low  . Cushingoid side effect of steroids ([place]) 04/17/2014    Priority: Low  . Internal hemorrhoids     Priority: Low  . Preoperative clearance 03/25/2014   Priority: Low  . Solitary pulmonary nodule, on CT 02/2013 - stable over 2 years in 2015 02/20/2013    Priority: Low  . Morbid obesity ([place]) 06/04/2009    Priority: Low  . GERD 08/21/2006    Priority: Low  . Subcortical microvascular ischemic occlusive disease 07/12/2018  . Rapid palpitations 07/12/2018  . Impingement of left ankle joint 06/26/2018  . Anxiety 04/05/2018  . Total knee replacement status, left 12/07/2017  . Unilateral primary osteoarthritis, left knee 11/07/2017  . S/P total knee arthroplasty, right 06/29/2017  . Multiple allergies 10/14/2016  . Wound infection complicating hardware ([place]) 03/27/2015    Medications- reviewed and updated Current Outpatient Medications  Medication Sig Dispense Refill  . albuterol (PROVENTIL HFA;VENTOLIN HFA) 108 (90 Base) MCG/ACT inhaler Inhale 1-2 puffs into the lungs every 6 (six) hours as needed for wheezing or shortness of breath. 8 g 5  . allopurinol (ZYLOPRIM) 100 MG tablet Take 100 mg by mouth 2 (two) times daily.     [name] aspirin EC 81 MG tablet Take 81 mg by mouth daily.     [name] atorvastatin (LIPITOR) 40 MG tablet TAKE 1 TAB BY MOUTH ONCE DAILY (Patient taking differently: Take 40 mg by mouth once a day) 90 tablet 3  . BAYER MICROLET LANCETS lancets Use as instructed to check sugar 3 times daily 300 each 5  .  buPROPion (WELLBUTRIN XL) 300 MG 24 hr tablet Take 1 tablet (300 mg total) by mouth daily. 90 tablet 3  . Calcium Citrate 200 MG TABS Take 200 mg by mouth daily.    . carvedilol (COREG) 25 MG tablet TAKE 1 TABLET (25 MG TOTAL) BY MOUTH 2 (TWO) TIMES DAILY WITH A MEAL. 180 tablet 1  . Cholecalciferol (VITAMIN D) 2000 units tablet Take 2,000 Units by mouth daily.    . colestipol (COLESTID) 1 g tablet Take 2 tablets (2 g total) by mouth 2 (two) times daily. 120 tablet 5  . Cyanocobalamin (VITAMIN B-12 PO) Take 2,500 mcg by mouth daily.     [name] DEXILANT 60 MG capsule TAKE 1 CAPSULE BY MOUTH EVERY DAY 30 capsule 0  . dicyclomine (BENTYL)  20 MG tablet Take one tablet 15 minutes before meals 90 tablet 1  . diltiazem (CARDIZEM CD) 120 MG 24 hr capsule TAKE 1 CAPSULE BY MOUTH EVERY DAY (FOR FUTURE REFILLS MAKE A DOCTORS VISIT) 90 capsule 3  . fenofibrate (TRICOR) 145 MG tablet TAKE 1 TABLET BY MOUTH EVERY DAY 30 tablet 11  . fluticasone (FLONASE) 50 MCG/ACT nasal spray Place 2 sprays into both nostrils daily as needed for allergies. 16 g 3  . fluticasone furoate-vilanterol (BREO ELLIPTA) 200-25 MCG/INH AEPB Inhale 1 puff into the lungs daily.    . furosemide (LASIX) 40 MG tablet TAKE 1 TABLET BY MOUTH TWICE A DAY 180 tablet 3  . gabapentin (NEURONTIN) 100 MG capsule TAKE 1 CAPSULE BY MOUTH THREE TIMES A DAY 270 capsule 0  . glucose blood (BAYER CONTOUR TEST) test strip Use as instructed to check sugar 3 times daily 300 each 5  . insulin aspart (NOVOLOG FLEXPEN) 100 UNIT/ML FlexPen Inject 35-40 Units into the skin 3 (three) times daily with meals. 15 pen 5  . Insulin Glargine (TOUJEO SOLOSTAR) 300 UNIT/ML SOPN Inject 40 Units into the skin at bedtime. 5 pen 11  . Insulin Pen Needle 33G X 4 MM MISC 1 each by Does not apply route 4 (four) times daily. 200 each 11  . ipratropium (ATROVENT) 0.06 % nasal spray Place 2 sprays into both nostrils 4 (four) times daily. 15 mL 0  . isosorbide mononitrate (IMDUR) 30 MG 24 hr tablet Take 1 tablet (30 mg total) by mouth at bedtime. 90 tablet 3  . KLOR-CON M20 20 MEQ tablet TAKE 1 & 1/2 TABLETS BY MOUTH TWICE A DAY  2  . linaclotide (LINZESS) 290 MCG CAPS capsule Take 1 capsule (290 mcg total) by mouth daily before breakfast. 30 capsule 11  . LORazepam (ATIVAN) 0.5 MG tablet Take 0.5 mg by mouth every 12 (twelve) hours as needed for anxiety.    . metFORMIN (GLUCOPHAGE) 1000 MG tablet TAKE 1 TABLET BY MOUTH TWICE A DAY WITH A MEAL 180 tablet 0  . NEOMYCIN-POLYMYXIN-HYDROCORTISONE (CORTISPORIN) 1 % SOLN OTIC solution Apply 1-2 drops to toe BID after soaking 10 mL 1  . nitroGLYCERIN (NITROSTAT) 0.4 MG SL  tablet Place 1 tablet (0.4 mg total) under the tongue every 5 (five) minutes as needed for chest pain. Reported on 01/05/2016 25 tablet 6  . ondansetron (ZOFRAN-ODT) 4 MG disintegrating tablet Take 1 tablet (4 mg total) by mouth every 8 (eight) hours as needed for nausea or vomiting. 20 tablet 1  . PARoxetine Mesylate (BRISDELLE) 7.5 MG CAPS Take 7.5 mg by mouth daily.    . Potassium Chloride ER 20 MEQ TBCR TAKE ONE AND ONE-HALF TABLETS BY MOUTH 2 TIMES  DAILY 270 tablet 2  . predniSONE (DELTASONE) 5 MG tablet Take 5 mg by mouth daily with breakfast.    . senna (SENOKOT) 8.6 MG TABS tablet Take 1 tablet (8.6 mg total) by mouth daily as needed for mild constipation or moderate constipation. 120 each 0  . telmisartan (MICARDIS) 20 MG tablet TAKE 1 TABLET BY MOUTH EVERY DAY 90 tablet 2  . triamcinolone cream (KENALOG) 0.1 % Apply 1 application topically 2 (two) times daily as needed (for rash. stop after 7-10 days.). 80 g 0  . venlafaxine XR (EFFEXOR-XR) 75 MG 24 hr capsule Take 1 capsule (75 mg total) by mouth 2 (two) times daily.    . Vitamin D-Vitamin K (K2 PLUS D3) [phone] MCG-UNIT TABS cholecalciferol (vit D3) 1,000 unit-vitamin K2 (MK4) 100 mcg tablet  Take by oral route.    . methocarbamol (ROBAXIN) 500 MG tablet Take 1 tablet (500 mg total) by mouth every 6 (six) hours as needed for muscle spasms. 40 tablet 0   No current facility-administered medications for this visit.     Objective: BP (!) 122/52 (BP Location: Left Arm, Patient Position: Sitting, Cuff Size: Large)   Pulse 69   Temp 98.4 F (36.9 C) (Oral)   Ht 5\' 6"  (1.676 m)   Wt 252 lb 6.4 oz (114.5 kg)   SpO2 96%   BMI 40.74 kg/m  Gen: NAD, resting comfortably CV: RRR no murmurs rubs or gallops Lungs: CTAB no crackles, wheeze, rhonchi Abdomen: soft/nontender other than very mild tenderness over raised soft mobile lesion in RUQ/nondistended/normal bowel sounds. No rebound or guarding.  Ext: no edema Skin: warm, dry MSK: pain  with flexing neck and rotating to the right. Can touch chin to chest without issue.  No nuchal rigidity.  Results for orders placed or performed in visit on 07/14/18 (from the past 24 hour(s))  POCT Urinalysis Dipstick (Automated)     Status: Normal   Collection Time: 07/14/18 12:06 PM  Result Value Ref Range   Color, UA Dark Yellow    Clarity, UA Clear    Glucose, UA Negative Negative   Bilirubin, UA Negative    Ketones, UA Negative    Spec Grav, UA 1.020 1.010 - 1.025   Blood, UA Negative    pH, UA 5.5 5.0 - 8.0   Protein, UA Negative Negative   Urobilinogen, UA 0.2 0.2 or 1.0 E.U./dL   Nitrite, UA Negative    Leukocytes, UA Negative Negative   *Note: Due to a large number of results and/or encounters for the requested time period, some results have not been displayed. A complete set of results can be found in Results Review.   Assessment/Plan:  Dysuria/flank pain  s: mild burning with urination for 2-3 days. Also mild dysuria Wanted to make sure no UTI. No polyuria. No nocturia. No lower abdominal pain A/P: UA not concerning for UTI- she agrees to return for new or worsening symptoms  Neck pain (Right side) s:  2-3 days of right sided neck pain/tension. Worse when moves neck. No fever, chills, fatigue more than normal. Worse in AMs- thinks it started when she woke up one morning. Does not feel sick overall. Getting some mild headaches due to this.  Heat and ice have not seemed to help very much. A/P: Patient has some clear spasm in her neck muscles-suspect she may have slept awkwardly one night.  Will try muscle relaxants.  Can continue heat she has been using.  Encouraged her to consider massage.  If not improving could consider physical therapy.  Would consider prednisone burst if persistent as well but has chronic prednisone on board and want to avoid steroid burst unless we have to use it for lack of improvement. Could also consider cervical films- suspect arthritis underlying  trigger for spasm  Very mild Right upper quadrant tenderness- Likely lipoma S: Notes at least 5 x 5 slightly raised area on right upper abdomen.  Mild tenderness to touch. A/P: This is most likely a lipoma over right upper abdomen. She will let us know if worsening symptoms.   Future Appointments  Date Time Provider [place]  07/20/2018 10:30 AM [facility]ITOR [facility]  07/24/2018  1:45 PM Newt Minion, MD [facility] None  07/27/2018  2:15 PM [facility] NURSE [facility]  08/11/2018  7:30 AM Renato Shin, MD [facility] None  08/23/2018  9:00 AM [facility] NURSE [facility]  08/24/2018 10:00 AM [name], MD [facility]  09/13/2018 10:20 AM Leonie Man, MD [facility]   Lab/Order associations: Neck pain on right side  Flank pain - Plan: POCT Urinalysis Dipstick (Automated), CANCELED: Urine Culture  RUQ pain  Lipoma of torso  Need for prophylactic vaccination and inoculation against influenza - Plan: Flu Vaccine QUAD 36+ mos IM  Meds ordered this encounter  Medications  . methocarbamol (ROBAXIN) 500 MG tablet    Sig: Take 1 tablet (500 mg total) by mouth every 6 (six) hours as needed for muscle spasms.    Dispense:  40 tablet    Refill:  0   Return precautions advised.  Garret Reddish, MD

## 2018-07-14 NOTE — Assessment & Plan Note (Signed)
Unfortunately, her palpitation episodes are not happening daily.  The 90 to happen every 1 to 2 days.  As such, would need a 30-day monitor to assess and ensure that he is not having any significant arrhythmias. Depending on those results, anticipate increasing diltiazem to 180 mg daily.  This will add to both blood pressure control from a global perspective but also rate control.

## 2018-07-14 NOTE — Assessment & Plan Note (Signed)
She has now had several negative catheterization so I do not think her chest pain is related to heart artery disease.  Could barely be related to elevated blood pressures and even microvascular ischemia.  Plan: Plan is to continue to titrate up diltiazem and ARB. Continue Lasix with standing dose and as needed by sliding scale. Monitor blood pressure and volume status are stabilized, would expect symptoms to improve.

## 2018-07-14 NOTE — Assessment & Plan Note (Signed)
Borderline elevated pressures today.  Is now on max dose carvedilol.  Is back on ARB (telmisartan/Micardis at 20 mg daily.  Is also on diltiazem for additional rate control and pulmonary pretension.  Plan: Pending results of monitor, will increase diltiazem to 100 mg daily. Next step would be to increase telmisartan to 40 mg daily. Also standing dose of Lasix.  Maintaining relatively euvolemic state.

## 2018-07-15 ENCOUNTER — Other Ambulatory Visit: Payer: Self-pay | Admitting: Endocrinology

## 2018-07-17 ENCOUNTER — Other Ambulatory Visit: Payer: Self-pay

## 2018-07-17 MED ORDER — GABAPENTIN 100 MG PO CAPS: ORAL_CAPSULE | ORAL | 0 refills | Status: DC

## 2018-07-18 ENCOUNTER — Ambulatory Visit: Payer: BLUE CROSS/BLUE SHIELD | Admitting: Adult Health

## 2018-07-19 ENCOUNTER — Ambulatory Visit (INDEPENDENT_AMBULATORY_CARE_PROVIDER_SITE_OTHER): Payer: BLUE CROSS/BLUE SHIELD | Admitting: Physician Assistant

## 2018-07-19 ENCOUNTER — Encounter: Payer: Self-pay | Admitting: Physician Assistant

## 2018-07-19 VITALS — BP 118/54 | HR 67 | Temp 98.2°F | Ht 66.0 in | Wt 256.5 lb

## 2018-07-19 DIAGNOSIS — M542 Cervicalgia: Secondary | ICD-10-CM

## 2018-07-19 MED ORDER — METHYLPREDNISOLONE 4 MG PO TBPK: ORAL_TABLET | ORAL | 0 refills | Status: DC

## 2018-07-19 NOTE — Progress Notes (Signed)
[name] is a 57 y.o. female here for a follow up of a pre-existing problem.  I acted as a Education administrator for [employer], PA-C Anselmo Pickler, LPN  History of Present Illness:   Chief Complaint  Patient presents with  . Neck Pain    Neck Pain   This is a recurrent problem. Episode onset: Started 2 weeks ago. The problem occurs constantly. The problem has been gradually worsening. The pain is associated with nothing. The pain is present in the right side. The quality of the pain is described as shooting and aching. The pain is at a severity of 7/10. The pain is moderate. The symptoms are aggravated by bending, position and twisting. The pain is same all the time. Associated symptoms include headaches. Pertinent negatives include no numbness, tingling, visual change or weakness. She has tried muscle relaxants, heat and acetaminophen for the symptoms. The treatment provided no relief.   She saw her PCP for this on 07/14/18. Was put on muscle relaxer, robaxin. She states that she has taken this without any relief. Denies any changes in her symptoms since she saw Dr. Yong Channel. Denies chest pain, radiation of pain down arm or to L side.  Past Medical History:  Diagnosis Date  . Abnormal SPEP 04/17/2014  . Acute left ankle pain 01/26/2017  . ANEMIA-UNSPECIFIED 09/18/2009  . Anxiety   . Arthritis   . Chronic diastolic heart failure, NYHA class 2 (HCC)    Normal LVEDP by May 2018  . COPD (chronic obstructive pulmonary disease) ([place])   . Depression   . DIABETES MELLITUS, TYPE II 08/21/2006  . Diabetic osteomyelitis ([place]) 05/29/2015  . Fracture of 5th metatarsal    non union  . GERD 08/21/2006  . GOUT 08/20/2010  . Hx of umbilical hernia repair   . HYPERLIPIDEMIA 08/21/2006  . HYPERTENSION 08/21/2006  . Infection of wound due to methicillin resistant Staphylococcus aureus (MRSA)   . Internal hemorrhoids   . Multiple allergies 10/14/2016  . OBESITY 06/04/2009  . Onychomycosis 10/27/2015  .  Osteomyelitis of left foot ([place]) 05/29/2015  . Pulmonary sarcoidosis ([place])    Followed locally by pulmonology, but also by Dr. Casper Harrison at [facility]  . Right knee pain 01/26/2017  . Vocal cord dysfunction   . Wears partial dentures      Social History   Socioeconomic History  . Marital status: Married    Spouse name: JANESHIA CILIBERTO  . Number of children: 2  . Years of education: 64  . Highest education level: Not on file  Occupational History  . Occupation: DISABLED  Social Needs  . Financial resource strain: Not on file  . Food insecurity:    Worry: Not on file    Inability: Not on file  . Transportation needs:    Medical: Not on file    Non-medical: Not on file  Tobacco Use  . Smoking status: Never Smoker  . Smokeless tobacco: Never Used  Substance and Sexual Activity  . Alcohol use: No    Alcohol/week: 0.0 standard drinks  . Drug use: No  . Sexual activity: Yes    Birth control/protection: Surgical  Lifestyle  . Physical activity:    Days per week: Not on file    Minutes per session: Not on file  . Stress: Not on file  Relationships  . Social connections:    Talks on phone: Not on file    Gets together: Not on file    Attends religious service: Not  on file    Active member of club or organization: Not on file    Attends meetings of clubs or organizations: Not on file    Relationship status: Not on file  . Intimate partner violence:    Fear of current or ex partner: Not on file    Emotionally abused: Not on file    Physically abused: Not on file    Forced sexual activity: Not on file  Other Topics Concern  . Not on file  Social History Narrative   Married 1994. 2 sons who both live close and 1 grandson.       Disability due to sarcoidosis. 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 ABDOMINAL HYSTERECTOMY    . APPENDECTOMY    . BLADDER SUSPENSION  11/11/2011   Procedure: TRANSVAGINAL TAPE (TVT) PROCEDURE;  Surgeon: Olga Millers, MD;  Location: [facility];  Service: Gynecology;  Laterality: N/A;  . CAROTIDS  02/18/11   CAROTID DUPLEX; VERTEBRALS ARE PATENT WITH ANTEGRADE FLOW. ICA/CCA RATIO 1.61 ON RIGHT AND 0.75 ON LEFT  . CHOLECYSTECTOMY  1984  . CYSTOSCOPY  11/11/2011   Procedure: CYSTOSCOPY;  Surgeon: Olga Millers, MD;  Location: [facility];  Service: Gynecology;  Laterality: N/A;  . EXTUBATION (ENDOTRACHEAL) IN OR N/A 09/23/2017   Procedure: EXTUBATION (ENDOTRACHEAL) IN OR;  Surgeon: Helayne Seminole, MD;  Location: [facility];  Service: ENT;  Laterality: N/A;  . FIBEROPTIC LARYNGOSCOPY AND TRACHEOSCOPY N/A 09/23/2017   Procedure: FLEXIBLE FIBEROPTIC LARYNGOSCOPY;  Surgeon: Helayne Seminole, MD;  Location: [facility];  Service: ENT;  Laterality: N/A;  . FRACTURE SURGERY     foot  . HERNIA REPAIR    . I&D EXTREMITY Left 06/27/2015   Procedure: Partial Excision Left Calcaneus, Place Antibiotic Beads, and Wound VAC;  Surgeon: Newt Minion, MD;  Location: [facility];  Service: Orthopedics;  Laterality: Left;  . KNEE ARTHROSCOPY     right  . LEFT AND RIGHT HEART CATHETERIZATION WITH CORONARY ANGIOGRAM N/A 04/23/2013   Procedure: LEFT AND RIGHT HEART CATHETERIZATION WITH CORONARY ANGIOGRAM;  Surgeon: Leonie Man, MD;  Location: [facility];  Service: Cardiovascular;  Laterality: N/A;  . LEFT HEART CATH AND CORONARY ANGIOGRAPHY N/A 03/11/2017   Procedure: Left Heart Cath and Coronary Angiography;  Surgeon: Sherren Mocha, MD;  Location: [facility]; angiographically minimal CAD in the LAD otherwise normal.;  Normal LVEDP.  FALSE POSITIVE MYOVIEW  . LEFT HEART CATH AND CORONARY ANGIOGRAPHY  07/20/2010   LVEF 50-55% WITH VERY MILD GLOBAL HYPOKINESIA; ESSENTIALLY NORMAL CORONARY ARTERIES; NORMAL LV FUNCTION  . METATARSAL OSTEOTOMY WITH  OPEN REDUCTION INTERNAL FIXATION (ORIF) METATARSAL WITH FUSION Left 04/09/2014   Procedure: LEFT FOOT FRACTURE OPEN TREATMENT METATARSAL INCLUDES INTERNAL FIXATION EACH;  Surgeon: Lorn Junes, MD;  Location: [place];  Service: Orthopedics;  Laterality: Left;  . NISSEN FUNDOPLICATION  5397  . NM MYOVIEW LTD  03/09/2013   Lexiscan --> EF 50%; NORMAL MYOCARDIAL PERFUSION STUDY - breast attenuation  . NM MYOVIEW LTD  03/10/2017   : Moderate size "stress-induced "perfusion defect at the apex as well as "ill-defined stress-induced perfusion defect in the lateral wall.  EF 55%.  INTERMEDIATE risk. -->  FALSE POSITIVE  . Right and left CARDIAC CATHETERIZATION  04/23/2013   Angiographic normal coronaries; LVEDP 20 mmHg, PCWP 12-14 mmHg, RAP 12 mmHg.; Fick CO/CI 4.9/2.2  . TOTAL KNEE ARTHROPLASTY Right 06/29/2017   Procedure: RIGHT TOTAL KNEE ARTHROPLASTY;  Surgeon: Newt Minion, MD;  Location: [facility];  Service: Orthopedics;  Laterality: Right;  . TOTAL KNEE ARTHROPLASTY Left 12/07/2017  . TOTAL KNEE ARTHROPLASTY Left 12/07/2017   Procedure: LEFT TOTAL KNEE ARTHROPLASTY;  Surgeon: Newt Minion, MD;  Location: [facility];  Service: Orthopedics;  Laterality: Left;  . TRACHEOSTOMY TUBE PLACEMENT N/A 09/20/2017   Procedure: AWAKE INTUBATION WITH ANESTHESIA WITH VIDEO ASSISTANCE;  Surgeon: Helayne Seminole, MD;  Location: [facility];  Service: ENT;  Laterality: N/A;  . TRANSTHORACIC ECHOCARDIOGRAM  08/2014   Normal LV size and function.  Mild LVH.  EF 55-60%.  Normal regional wall motion.  GR 1 DD.  Normal RV size and function .  [name] TUBAL LIGATION     with reversal in 1994  . VENTRAL HERNIA REPAIR      Family History  Problem Relation Age of Onset  . Diabetes Father   . Heart attack Father 35  . Coronary artery disease Father   . Heart failure Father   . Throat cancer Father   . COPD Mother   . Emphysema Mother   . Asthma Mother   . Heart failure Mother   . Breast cancer Mother   .  Heart attack Maternal Grandfather   . Sarcoidosis Maternal Uncle   . Lung cancer Brother   . Diabetes Brother   . Colon cancer Neg Hx   . Rectal cancer Neg Hx     Allergies  Allergen Reactions  . Methotrexate Other (See Comments)    Peri-oral and buccal lesions  . Vancomycin Other (See Comments)    DOSE RELATED NEPHROTOXICITY  . Lisinopril Cough  . Chlorhexidine Itching  . Clindamycin/Lincomycin Nausea And Vomiting and Rash    RASH > ALLERGY INTOLERANCE > NAUSEA & VOMITING  . Doxycycline Rash  . Teflaro [Ceftaroline] Rash    Tolerates ceftriaxone     Current Medications:   Current Outpatient Medications:  .  albuterol (PROVENTIL HFA;VENTOLIN HFA) 108 (90 Base) MCG/ACT inhaler, Inhale 1-2 puffs into the lungs every 6 (six) hours as needed for wheezing or shortness of breath., Disp: 8 g, Rfl: 5 .  allopurinol (ZYLOPRIM) 100 MG tablet, Take 100 mg by mouth 2 (two) times daily. , Disp: , Rfl:  .  aspirin EC 81 MG tablet, Take 81 mg by mouth daily. , Disp: , Rfl:  .  atorvastatin (LIPITOR) 40 MG tablet, TAKE 1 TAB BY MOUTH ONCE DAILY (Patient taking differently: Take 40 mg by mouth once a day), Disp: 90 tablet, Rfl: 3 .  BAYER MICROLET LANCETS lancets, Use as instructed to check sugar 3 times daily, Disp: 300 each, Rfl: 5 .  buPROPion (WELLBUTRIN XL) 300 MG 24 hr tablet, Take 1 tablet (300 mg total) by mouth daily., Disp: 90 tablet, Rfl: 3 .  Calcium Citrate 200 MG TABS, Take 200 mg by mouth daily., Disp: , Rfl:  .  carvedilol (COREG) 25 MG tablet, TAKE 1 TABLET (25 MG TOTAL) BY MOUTH 2 (TWO) TIMES DAILY WITH A MEAL., Disp: 180 tablet, Rfl: 1 .  Cholecalciferol (VITAMIN D) 2000 units tablet, Take 2,000 Units by mouth daily., Disp: , Rfl:  .  Cyanocobalamin (VITAMIN B-12 PO), Take 2,500 mcg by mouth daily. , Disp: , Rfl:  .  DEXILANT 60 MG capsule, TAKE 1 CAPSULE BY  MOUTH EVERY DAY, Disp: 30 capsule, Rfl: 0 .  dicyclomine (BENTYL) 20 MG tablet, Take one tablet 15 minutes before meals,  Disp: 90 tablet, Rfl: 1 .  diltiazem (CARDIZEM CD) 120 MG 24 hr capsule, TAKE 1 CAPSULE BY MOUTH EVERY DAY (FOR FUTURE REFILLS MAKE A DOCTORS VISIT), Disp: 90 capsule, Rfl: 3 .  fenofibrate (TRICOR) 145 MG tablet, TAKE 1 TABLET BY MOUTH EVERY DAY, Disp: 30 tablet, Rfl: 11 .  fluticasone (FLONASE) 50 MCG/ACT nasal spray, Place 2 sprays into both nostrils daily as needed for allergies., Disp: 16 g, Rfl: 3 .  fluticasone furoate-vilanterol (BREO ELLIPTA) 200-25 MCG/INH AEPB, Inhale 1 puff into the lungs daily., Disp: , Rfl:  .  furosemide (LASIX) 40 MG tablet, TAKE 1 TABLET BY MOUTH TWICE A DAY, Disp: 180 tablet, Rfl: 3 .  gabapentin (NEURONTIN) 100 MG capsule, TAKE 1 CAPSULE BY MOUTH THREE TIMES A DAY, Disp: 270 capsule, Rfl: 0 .  glucose blood (BAYER CONTOUR TEST) test strip, Use as instructed to check sugar 3 times daily, Disp: 300 each, Rfl: 5 .  insulin aspart (NOVOLOG FLEXPEN) 100 UNIT/ML FlexPen, Inject 35-40 Units into the skin 3 (three) times daily with meals., Disp: 15 pen, Rfl: 5 .  Insulin Glargine (TOUJEO SOLOSTAR) 300 UNIT/ML SOPN, Inject 40 Units into the skin at bedtime., Disp: 5 pen, Rfl: 11 .  Insulin Pen Needle 33G X 4 MM MISC, 1 each by Does not apply route 4 (four) times daily., Disp: 200 each, Rfl: 11 .  ipratropium (ATROVENT) 0.06 % nasal spray, Place 2 sprays into both nostrils 4 (four) times daily., Disp: 15 mL, Rfl: 0 .  isosorbide mononitrate (IMDUR) 30 MG 24 hr tablet, Take 1 tablet (30 mg total) by mouth at bedtime., Disp: 90 tablet, Rfl: 3 .  KLOR-CON M20 20 MEQ tablet, TAKE 1 & 1/2 TABLETS BY MOUTH TWICE A DAY, Disp: , Rfl: 2 .  linaclotide (LINZESS) 290 MCG CAPS capsule, Take 1 capsule (290 mcg total) by mouth daily before breakfast., Disp: 30 capsule, Rfl: 11 .  LORazepam (ATIVAN) 0.5 MG tablet, Take 0.5 mg by mouth every 12 (twelve) hours as needed for anxiety., Disp: , Rfl:  .  metFORMIN (GLUCOPHAGE) 1000 MG tablet, TAKE 1 TABLET BY MOUTH TWICE A DAY WITH A MEAL, Disp:  180 tablet, Rfl: 0 .  methocarbamol (ROBAXIN) 500 MG tablet, Take 1 tablet (500 mg total) by mouth every 6 (six) hours as needed for muscle spasms., Disp: 40 tablet, Rfl: 0 .  NEOMYCIN-POLYMYXIN-HYDROCORTISONE (CORTISPORIN) 1 % SOLN OTIC solution, Apply 1-2 drops to toe BID after soaking, Disp: 10 mL, Rfl: 1 .  nitroGLYCERIN (NITROSTAT) 0.4 MG SL tablet, Place 1 tablet (0.4 mg total) under the tongue every 5 (five) minutes as needed for chest pain. Reported on 01/05/2016, Disp: 25 tablet, Rfl: 6 .  ondansetron (ZOFRAN-ODT) 4 MG disintegrating tablet, Take 1 tablet (4 mg total) by mouth every 8 (eight) hours as needed for nausea or vomiting., Disp: 20 tablet, Rfl: 1 .  PARoxetine Mesylate (BRISDELLE) 7.5 MG CAPS, Take 7.5 mg by mouth daily., Disp: , Rfl:  .  Potassium Chloride ER 20 MEQ TBCR, TAKE ONE AND ONE-HALF TABLETS BY MOUTH 2 TIMES DAILY, Disp: 270 tablet, Rfl: 2 .  predniSONE (DELTASONE) 5 MG tablet, Take 5 mg by mouth daily with breakfast., Disp: , Rfl:  .  senna (SENOKOT) 8.6 MG TABS tablet, Take 1 tablet (8.6 mg total) by mouth daily as needed for mild constipation or moderate constipation.,  Disp: 120 each, Rfl: 0 .  telmisartan (MICARDIS) 20 MG tablet, TAKE 1 TABLET BY MOUTH EVERY DAY, Disp: 90 tablet, Rfl: 2 .  triamcinolone cream (KENALOG) 0.1 %, Apply 1 application topically 2 (two) times daily as needed (for rash. stop after 7-10 days.)., Disp: 80 g, Rfl: 0 .  venlafaxine XR (EFFEXOR-XR) 75 MG 24 hr capsule, Take 1 capsule (75 mg total) by mouth 2 (two) times daily., Disp: , Rfl:  .  Vitamin D-Vitamin K (K2 PLUS D3) [phone] MCG-UNIT TABS, cholecalciferol (vit D3) 1,000 unit-vitamin K2 (MK4) 100 mcg tablet  Take by oral route., Disp: , Rfl:  .  methylPREDNISolone (MEDROL DOSEPAK) 4 MG TBPK tablet, 6-5-4-3-2-1-off, Disp: 21 tablet, Rfl: 0   Review of Systems:   Review of Systems  Musculoskeletal: Positive for neck pain.  Neurological: Positive for headaches. Negative for tingling,  weakness and numbness.    Vitals:   Vitals:   07/19/18 1035  BP: (!) 118/54  Pulse: 67  Temp: 98.2 F (36.8 C)  TempSrc: Oral  SpO2: 95%  Weight: 256 lb 8 oz (116.3 kg)  Height: 5\' 6"  (1.676 m)     Body mass index is 41.4 kg/m.  Physical Exam:   Physical Exam  Constitutional: She appears well-developed. She is cooperative.  Non-toxic appearance. She does not have a sickly appearance. She does not appear ill. No distress.  Neck: No neck rigidity. No erythema present.  Cardiovascular: Normal rate, regular rhythm, S1 normal, S2 normal, normal heart sounds and normal pulses.  No LE edema  Pulmonary/Chest: Effort normal and breath sounds normal.  Musculoskeletal:  Decreased ROM 2/2 pain with lateral side bends, rotation and flexion, not extension. Reproducible tenderness to R SCM. No bony tenderness. No evidence of erythema, rash or ecchymosis.    Neurological: She is alert. GCS eye subscore is 4. GCS verbal subscore is 5. GCS motor subscore is 6.  Skin: Skin is warm, dry and intact.  Psychiatric: She has a normal mood and affect. Her speech is normal and behavior is normal.  Nursing note and vitals reviewed.   Assessment and Plan:    Grisela was seen today for neck pain.  Diagnoses and all orders for this visit:  Neck pain on right side  Other orders -     methylPREDNISolone (MEDROL DOSEPAK) 4 MG TBPK tablet; 6-5-4-3-2-1-off   No red flags on exam. Suspect muscle strain. Reviewed prior note by PCP will trial steroid burst and if no improvement, refer to PT here with Lyndee Hensen. Patient verbalized understanding to plan. If symptoms change, I asked her to return to office for further work-up, such as cervical xray. Patient and husband verbalized understanding to plan.  . Reviewed expectations re: course of current medical issues. . Discussed self-management of symptoms. . Outlined signs and symptoms indicating need for more acute intervention. . Patient verbalized  understanding and all questions were answered. . See orders for this visit as documented in the electronic medical record. . Patient received an After-Visit Summary.  CMA or LPN served as scribe during this visit. History, Physical, and Plan performed by medical provider. The above documentation has been reviewed and is accurate and complete.  Inda Coke, PA-C

## 2018-07-19 NOTE — Patient Instructions (Signed)
Start oral prednisone dose pack today.  If symptoms persist, please call us and I will have Dr. Yong Channel put in an order for Physical Therapy in our office.  If symptoms change, please come back and see [place].

## 2018-07-20 ENCOUNTER — Ambulatory Visit (INDEPENDENT_AMBULATORY_CARE_PROVIDER_SITE_OTHER): Payer: BLUE CROSS/BLUE SHIELD

## 2018-07-20 DIAGNOSIS — R002 Palpitations: Secondary | ICD-10-CM

## 2018-07-24 ENCOUNTER — Ambulatory Visit (INDEPENDENT_AMBULATORY_CARE_PROVIDER_SITE_OTHER): Payer: BLUE CROSS/BLUE SHIELD | Admitting: Orthopedic Surgery

## 2018-07-25 ENCOUNTER — Telehealth: Payer: Self-pay | Admitting: Family Medicine

## 2018-07-25 DIAGNOSIS — M542 Cervicalgia: Secondary | ICD-10-CM

## 2018-07-25 NOTE — Telephone Encounter (Signed)
Copied from [place] [id number]. Topic: Quick Communication - See Telephone Encounter >> Jul 25, 2018  3:39 PM Bea Graff, NT wrote: CRM for notification. See Telephone encounter for: 07/25/18. Pts husband calling and states this pt is still having pain on the right side of her head and top of her neck. She was seen by Dr. Yong Channel and Inda Coke. She has finished the prednisone she was prescribed and still in pain. He would like to know what Dr. Ansel Bong plan of care would be for this. Please advise.

## 2018-07-25 NOTE — Telephone Encounter (Signed)
See note

## 2018-07-26 ENCOUNTER — Emergency Department (HOSPITAL_COMMUNITY)
Admission: EM | Admit: 2018-07-26 | Discharge: 2018-07-27 | Disposition: A | Discharge status: Home / Self Care | Payer: BLUE CROSS/BLUE SHIELD | Attending: Emergency Medicine | Admitting: Emergency Medicine

## 2018-07-26 ENCOUNTER — Ambulatory Visit: Payer: BLUE CROSS/BLUE SHIELD | Admitting: Physician Assistant

## 2018-07-26 ENCOUNTER — Encounter (HOSPITAL_COMMUNITY): Payer: Self-pay | Admitting: Emergency Medicine

## 2018-07-26 DIAGNOSIS — E1122 Type 2 diabetes mellitus with diabetic chronic kidney disease: Secondary | ICD-10-CM | POA: Insufficient documentation

## 2018-07-26 DIAGNOSIS — Z7982 Long term (current) use of aspirin: Secondary | ICD-10-CM | POA: Diagnosis not present

## 2018-07-26 DIAGNOSIS — Z794 Long term (current) use of insulin: Secondary | ICD-10-CM | POA: Insufficient documentation

## 2018-07-26 DIAGNOSIS — Z79899 Other long term (current) drug therapy: Secondary | ICD-10-CM | POA: Diagnosis not present

## 2018-07-26 DIAGNOSIS — J45909 Unspecified asthma, uncomplicated: Secondary | ICD-10-CM | POA: Insufficient documentation

## 2018-07-26 DIAGNOSIS — I5032 Chronic diastolic (congestive) heart failure: Secondary | ICD-10-CM | POA: Diagnosis not present

## 2018-07-26 DIAGNOSIS — N183 Chronic kidney disease, stage 3 (moderate): Secondary | ICD-10-CM | POA: Insufficient documentation

## 2018-07-26 DIAGNOSIS — R51 Headache: Secondary | ICD-10-CM | POA: Insufficient documentation

## 2018-07-26 DIAGNOSIS — I130 Hypertensive heart and chronic kidney disease with heart failure and stage 1 through stage 4 chronic kidney disease, or unspecified chronic kidney disease: Secondary | ICD-10-CM | POA: Diagnosis not present

## 2018-07-26 DIAGNOSIS — R519 Headache, unspecified: Secondary | ICD-10-CM

## 2018-07-26 LAB — CBC WITH DIFFERENTIAL/PLATELET
Abs Immature Granulocytes: 0.21 10*3/uL — ABNORMAL HIGH (ref 0.00–0.07)
Basophils Absolute: 0.1 10*3/uL (ref 0.0–0.1)
Basophils Relative: 0 %
Eosinophils Absolute: 0.2 10*3/uL (ref 0.0–0.5)
Eosinophils Relative: 2 %
HCT: 33.8 % — ABNORMAL LOW (ref 36.0–46.0)
Hemoglobin: 10.4 g/dL — ABNORMAL LOW (ref 12.0–15.0)
Immature Granulocytes: 2 %
Lymphocytes Relative: 18 %
Lymphs Abs: 2.2 10*3/uL (ref 0.7–4.0)
MCH: 26.7 pg (ref 26.0–34.0)
MCHC: 30.8 g/dL (ref 30.0–36.0)
MCV: 86.7 fL (ref 80.0–100.0)
Monocytes Absolute: 0.8 10*3/uL (ref 0.1–1.0)
Monocytes Relative: 7 %
Neutro Abs: 8.9 10*3/uL — ABNORMAL HIGH (ref 1.7–7.7)
Neutrophils Relative %: 71 %
Platelets: 402 10*3/uL — ABNORMAL HIGH (ref 150–400)
RBC: 3.9 MIL/uL (ref 3.87–5.11)
RDW: 14.1 % (ref 11.5–15.5)
WBC: 12.3 10*3/uL — ABNORMAL HIGH (ref 4.0–10.5)
nRBC: 0 % (ref 0.0–0.2)

## 2018-07-26 LAB — BASIC METABOLIC PANEL
Anion gap: 13 (ref 5–15)
BUN: 24 mg/dL — ABNORMAL HIGH (ref 6–20)
CO2: 26 mmol/L (ref 22–32)
Calcium: 9 mg/dL (ref 8.9–10.3)
Chloride: 95 mmol/L — ABNORMAL LOW (ref 98–111)
Creatinine, Ser: 1.1 mg/dL — ABNORMAL HIGH (ref 0.44–1.00)
GFR calc Af Amer: >60 mL/min (ref >60)
GFR calc non Af Amer: 54 mL/min — ABNORMAL LOW (ref >60)
Glucose, Bld: 332 mg/dL — ABNORMAL HIGH (ref 70–99)
Potassium: 4 mmol/L (ref 3.5–5.1)
Sodium: 134 mmol/L — ABNORMAL LOW (ref 135–145)

## 2018-07-26 NOTE — Telephone Encounter (Signed)
See note

## 2018-07-26 NOTE — ED Triage Notes (Signed)
Patient reports persistent occipital ( behind right ear)  headache for 2 weeks unrelieved by prescription Prednisone and muscle relaxant . Denies head injury .

## 2018-07-26 NOTE — Telephone Encounter (Signed)
Patient's husband called and would like to know when the appointment for physical therapy will be. He said she is in so much pain. Also, he wants to know could something be called in for this?  Please Advise [phone]

## 2018-07-26 NOTE — Telephone Encounter (Signed)
Spoke to pt told her I saw she was on our schedule for Neck pain and that the next step would be Physical Therapy. Asked pt if she is okay with [place] putting a referral in for PT. Pt verbalized understanding and said yes. Told pt I will put order and someone will contact you about an appointment and I will cancel your appt for today at 1:00 pm with [facility]. Pt verbalized understanding.

## 2018-07-27 ENCOUNTER — Emergency Department (HOSPITAL_COMMUNITY): Payer: BLUE CROSS/BLUE SHIELD

## 2018-07-27 ENCOUNTER — Other Ambulatory Visit: Payer: BLUE CROSS/BLUE SHIELD

## 2018-07-27 ENCOUNTER — Ambulatory Visit (INDEPENDENT_AMBULATORY_CARE_PROVIDER_SITE_OTHER): Payer: BLUE CROSS/BLUE SHIELD

## 2018-07-27 ENCOUNTER — Ambulatory Visit (INDEPENDENT_AMBULATORY_CARE_PROVIDER_SITE_OTHER): Payer: BLUE CROSS/BLUE SHIELD | Admitting: Podiatry

## 2018-07-27 ENCOUNTER — Encounter: Payer: Self-pay | Admitting: Podiatry

## 2018-07-27 DIAGNOSIS — M2041 Other hammer toe(s) (acquired), right foot: Secondary | ICD-10-CM

## 2018-07-27 DIAGNOSIS — M2042 Other hammer toe(s) (acquired), left foot: Secondary | ICD-10-CM

## 2018-07-27 DIAGNOSIS — R51 Headache: Secondary | ICD-10-CM | POA: Diagnosis not present

## 2018-07-27 MED ORDER — KETOROLAC TROMETHAMINE 30 MG/ML IJ SOLN
30.0000 mg | Freq: Once | INTRAMUSCULAR | Status: AC
  Administered 2018-07-27: 30 mg via INTRAMUSCULAR
  Filled 2018-07-27: qty 1

## 2018-07-27 MED ORDER — OXYCODONE-ACETAMINOPHEN 5-325 MG PO TABS: 1.0000 | ORAL_TABLET | ORAL | 0 refills | Status: DC | PRN

## 2018-07-27 MED ORDER — OXYCODONE-ACETAMINOPHEN 5-325 MG PO TABS
1.0000 | ORAL_TABLET | Freq: Once | ORAL | Status: AC
  Administered 2018-07-27: 1 via ORAL
  Filled 2018-07-27: qty 1

## 2018-07-27 NOTE — ED Provider Notes (Signed)
[facility] EMERGENCY DEPARTMENT Provider Note   CSN: [id number] Arrival date & time: 07/26/18  2244     History   Chief Complaint Chief Complaint  Patient presents with  . Headache    HPI [name] is a 57 y.o. female.  Patient to ED with sharp, stabbing pain in the right occipital scalp that started 2 weeks ago. No injury. No aggravating or alleviating factors. The pain will sometimes radiate forward to temple but not often. She has been her doctor x 2 and is being treated with muscle relaxers but she reports these do not relieve her pain. No fever, visual changes, photophobia, nausea. The pain does not affect her cervical neck, or UE. No weakness or numbness.   The history is provided by the patient and the spouse. No language interpreter was used.    Past Medical History:  Diagnosis Date  . Abnormal SPEP 04/17/2014  . Acute left ankle pain 01/26/2017  . ANEMIA-UNSPECIFIED 09/18/2009  . Anxiety   . Arthritis   . Chronic diastolic heart failure, NYHA class 2 (HCC)    Normal LVEDP by May 2018  . COPD (chronic obstructive pulmonary disease) ([place])   . Depression   . DIABETES MELLITUS, TYPE II 08/21/2006  . Diabetic osteomyelitis ([place]) 05/29/2015  . Fracture of 5th metatarsal    non union  . GERD 08/21/2006  . GOUT 08/20/2010  . Hx of umbilical hernia repair   . HYPERLIPIDEMIA 08/21/2006  . HYPERTENSION 08/21/2006  . Infection of wound due to methicillin resistant Staphylococcus aureus (MRSA)   . Internal hemorrhoids   . Multiple allergies 10/14/2016  . OBESITY 06/04/2009  . Onychomycosis 10/27/2015  . Osteomyelitis of left foot ([place]) 05/29/2015  . Pulmonary sarcoidosis ([place])    Followed locally by pulmonology, but also by Dr. Casper Harrison at [facility]  . Right knee pain 01/26/2017  . Vocal cord dysfunction   . Wears partial dentures     Patient Active Problem List   Diagnosis Date Noted  . Subcortical microvascular ischemic occlusive  disease 07/12/2018  . Rapid palpitations 07/12/2018  . Impingement of left ankle joint 06/26/2018  . Constipation 04/21/2018  . Anxiety 04/05/2018  . Total knee replacement status, left 12/07/2017  . Unilateral primary osteoarthritis, left knee 11/07/2017  . Dysphagia 09/20/2017  . Epiglottitis   . Plantar fasciitis, right 07/13/2017  . S/P total knee arthroplasty, right 06/29/2017  . Osteoarthrosis, localized, primary, knee, right   . Sprain of calcaneofibular ligament of right ankle 06/06/2017  . Osteoarthritis of right knee 01/26/2017  . Multiple allergies 10/14/2016  . Onychomycosis 10/27/2015  . CKD (chronic kidney disease) stage 3, GFR 30-59 ml/min (HCC) 04/27/2015  . MRSA (methicillin resistant staph aureus) culture positive 03/27/2015  . Wound infection complicating hardware ([place]) 03/27/2015  . Fatty liver 09/30/2014  . Hot flashes 07/15/2014  . Abnormal SPEP 04/17/2014  . Fracture of left leg 04/17/2014  . Cushingoid side effect of steroids ([place]) 04/17/2014  . Internal hemorrhoids   . Depression   . Preoperative clearance 03/25/2014  . Obstructive chronic bronchitis without exacerbation ([place]) 09/18/2013  . Chest pain 04/11/2013  . Hypertensive heart disease with chronic diastolic congestive heart failure ([place])   . Solitary pulmonary nodule, on CT 02/2013 - stable over 2 years in 2015 02/20/2013  . Gout 08/20/2010  . Anemia 09/18/2009  . Morbid obesity ([place]) 06/04/2009  . Sleep apnea 04/21/2009  . Sarcoidosis of lung ([place]) 04/10/2007  . Hyperlipidemia 08/21/2006  .  Essential hypertension 08/21/2006  . GERD 08/21/2006  . Type II diabetes mellitus with renal manifestations ([place]) 08/21/2006    Past Surgical History:  Procedure Laterality Date  . ABDOMINAL HYSTERECTOMY    . APPENDECTOMY    . BLADDER SUSPENSION  11/11/2011   Procedure: TRANSVAGINAL TAPE (TVT) PROCEDURE;  Surgeon: Olga Millers, MD;  Location: [facility];  Service: Gynecology;  Laterality: N/A;  .  CAROTIDS  02/18/11   CAROTID DUPLEX; VERTEBRALS ARE PATENT WITH ANTEGRADE FLOW. ICA/CCA RATIO 1.61 ON RIGHT AND 0.75 ON LEFT  . CHOLECYSTECTOMY  1984  . CYSTOSCOPY  11/11/2011   Procedure: CYSTOSCOPY;  Surgeon: Olga Millers, MD;  Location: [facility];  Service: Gynecology;  Laterality: N/A;  . EXTUBATION (ENDOTRACHEAL) IN OR N/A 09/23/2017   Procedure: EXTUBATION (ENDOTRACHEAL) IN OR;  Surgeon: Helayne Seminole, MD;  Location: [facility];  Service: ENT;  Laterality: N/A;  . FIBEROPTIC LARYNGOSCOPY AND TRACHEOSCOPY N/A 09/23/2017   Procedure: FLEXIBLE FIBEROPTIC LARYNGOSCOPY;  Surgeon: Helayne Seminole, MD;  Location: [facility];  Service: ENT;  Laterality: N/A;  . FRACTURE SURGERY     foot  . HERNIA REPAIR    . I&D EXTREMITY Left 06/27/2015   Procedure: Partial Excision Left Calcaneus, Place Antibiotic Beads, and Wound VAC;  Surgeon: Newt Minion, MD;  Location: [facility];  Service: Orthopedics;  Laterality: Left;  . KNEE ARTHROSCOPY     right  . LEFT AND RIGHT HEART CATHETERIZATION WITH CORONARY ANGIOGRAM N/A 04/23/2013   Procedure: LEFT AND RIGHT HEART CATHETERIZATION WITH CORONARY ANGIOGRAM;  Surgeon: Leonie Man, MD;  Location: [facility] CATH LAB;  Service: Cardiovascular;  Laterality: N/A;  . LEFT HEART CATH AND CORONARY ANGIOGRAPHY N/A 03/11/2017   Procedure: Left Heart Cath and Coronary Angiography;  Surgeon: Sherren Mocha, MD;  Location: [facility] CV LAB; angiographically minimal CAD in the LAD otherwise normal.;  Normal LVEDP.  FALSE POSITIVE MYOVIEW  . LEFT HEART CATH AND CORONARY ANGIOGRAPHY  07/20/2010   LVEF 50-55% WITH VERY MILD GLOBAL HYPOKINESIA; ESSENTIALLY NORMAL CORONARY ARTERIES; NORMAL LV FUNCTION  . METATARSAL OSTEOTOMY WITH OPEN REDUCTION INTERNAL FIXATION (ORIF) METATARSAL WITH FUSION Left 04/09/2014   Procedure: LEFT FOOT FRACTURE OPEN TREATMENT METATARSAL INCLUDES INTERNAL FIXATION EACH;  Surgeon: Lorn Junes, MD;  Location: [facility];  Service: Orthopedics;   Laterality: Left;  . NISSEN FUNDOPLICATION  4166  . NM MYOVIEW LTD  03/09/2013   Lexiscan --> EF 50%; NORMAL MYOCARDIAL PERFUSION STUDY - breast attenuation  . NM MYOVIEW LTD  03/10/2017   : Moderate size "stress-induced "perfusion defect at the apex as well as "ill-defined stress-induced perfusion defect in the lateral wall.  EF 55%.  INTERMEDIATE risk. -->  FALSE POSITIVE  . Right and left CARDIAC CATHETERIZATION  04/23/2013   Angiographic normal coronaries; LVEDP 20 mmHg, PCWP 12-14 mmHg, RAP 12 mmHg.; Fick CO/CI 4.9/2.2  . TOTAL KNEE ARTHROPLASTY Right 06/29/2017   Procedure: RIGHT TOTAL KNEE ARTHROPLASTY;  Surgeon: Newt Minion, MD;  Location: [facility];  Service: Orthopedics;  Laterality: Right;  . TOTAL KNEE ARTHROPLASTY Left 12/07/2017  . TOTAL KNEE ARTHROPLASTY Left 12/07/2017   Procedure: LEFT TOTAL KNEE ARTHROPLASTY;  Surgeon: Newt Minion, MD;  Location: [facility];  Service: Orthopedics;  Laterality: Left;  . TRACHEOSTOMY TUBE PLACEMENT N/A 09/20/2017   Procedure: AWAKE INTUBATION WITH ANESTHESIA WITH VIDEO ASSISTANCE;  Surgeon: Helayne Seminole, MD;  Location: [facility];  Service: ENT;  Laterality: N/A;  . TRANSTHORACIC ECHOCARDIOGRAM  08/2014  Normal LV size and function.  Mild LVH.  EF 55-60%.  Normal regional wall motion.  GR 1 DD.  Normal RV size and function .  [name] TUBAL LIGATION     with reversal in 1994  . VENTRAL HERNIA REPAIR       OB History   None      Home Medications    Prior to Admission medications   Medication Sig Start Date End Date Taking? Authorizing Provider  albuterol (PROVENTIL HFA;VENTOLIN HFA) 108 (90 Base) MCG/ACT inhaler Inhale 1-2 puffs into the lungs every 6 (six) hours as needed for wheezing or shortness of breath. 08/05/17   Vivi Barrack, MD  allopurinol (ZYLOPRIM) 100 MG tablet Take 100 mg by mouth 2 (two) times daily.     [provider]  aspirin EC 81 MG tablet Take 81 mg by mouth daily.     [provider]  atorvastatin  (LIPITOR) 40 MG tablet TAKE 1 TAB BY MOUTH ONCE DAILY Patient taking differently: Take 40 mg by mouth once a day 07/18/17   [name], MD  BAYER MICROLET LANCETS lancets Use as instructed to check sugar 3 times daily 12/28/17   Philemon Kingdom, MD  buPROPion (WELLBUTRIN XL) 300 MG 24 hr tablet Take 1 tablet (300 mg total) by mouth daily. 12/09/17   [name], MD  Calcium Citrate 200 MG TABS Take 200 mg by mouth daily.    [provider]  carvedilol (COREG) 25 MG tablet TAKE 1 TABLET (25 MG TOTAL) BY MOUTH 2 (TWO) TIMES DAILY WITH A MEAL. 03/08/18   Leonie Man, MD  Cholecalciferol (VITAMIN D) 2000 units tablet Take 2,000 Units by mouth daily.    [provider]  Cyanocobalamin (VITAMIN B-12 PO) Take 2,500 mcg by mouth daily.     [provider]  DEXILANT 60 MG capsule TAKE 1 CAPSULE BY MOUTH EVERY DAY 04/17/18   Mannam, Hart Robinsons, MD  dicyclomine (BENTYL) 20 MG tablet Take one tablet 15 minutes before meals 02/10/18   Irene Shipper, MD  diltiazem (CARDIZEM CD) 120 MG 24 hr capsule TAKE 1 CAPSULE BY MOUTH EVERY DAY (FOR FUTURE REFILLS MAKE A DOCTORS VISIT) 03/27/18   Leonie Man, MD  fenofibrate (TRICOR) 145 MG tablet TAKE 1 TABLET BY MOUTH EVERY DAY 01/26/18   Leonie Man, MD  fluticasone [facility]) 50 MCG/ACT nasal spray Place 2 sprays into both nostrils daily as needed for allergies. 05/26/18   [name], MD  fluticasone furoate-vilanterol (BREO ELLIPTA) 200-25 MCG/INH AEPB Inhale 1 puff into the lungs daily.    [provider]  furosemide (LASIX) 40 MG tablet TAKE 1 TABLET BY MOUTH TWICE A DAY 03/27/18   Leonie Man, MD  gabapentin (NEURONTIN) 100 MG capsule TAKE 1 CAPSULE BY MOUTH THREE TIMES A DAY 07/17/18   Renato Shin, MD  glucose blood (BAYER CONTOUR TEST) test strip Use as instructed to check sugar 3 times daily 12/28/17   Philemon Kingdom, MD  insulin aspart (NOVOLOG FLEXPEN) 100 UNIT/ML FlexPen Inject 35-40 Units into the  skin 3 (three) times daily with meals. 04/11/18   Renato Shin, MD  Insulin Glargine (TOUJEO SOLOSTAR) 300 UNIT/ML SOPN Inject 40 Units into the skin at bedtime. 11/01/17   Philemon Kingdom, MD  Insulin Pen Needle 33G X 4 MM MISC 1 each by Does not apply route 4 (four) times daily. 01/15/16   Philemon Kingdom, MD  ipratropium (ATROVENT) 0.06 % nasal spray Place 2 sprays  into both nostrils 4 (four) times daily. 02/23/18   Vivi Barrack, MD  isosorbide mononitrate (IMDUR) 30 MG 24 hr tablet Take 1 tablet (30 mg total) by mouth at bedtime. 07/12/18 10/10/18  Leonie Man, MD  KLOR-CON M20 20 MEQ tablet TAKE 1 & 1/2 TABLETS BY MOUTH TWICE A DAY 05/23/18   [provider]  linaclotide Rolan Lipa) 290 MCG CAPS capsule Take 1 capsule (290 mcg total) by mouth daily before breakfast. 06/20/18   Irene Shipper, MD  LORazepam (ATIVAN) 0.5 MG tablet Take 0.5 mg by mouth every 12 (twelve) hours as needed for anxiety.    [provider]  metFORMIN (GLUCOPHAGE) 1000 MG tablet TAKE 1 TABLET BY MOUTH TWICE A DAY WITH A MEAL 07/05/18   Renato Shin, MD  methocarbamol (ROBAXIN) 500 MG tablet Take 1 tablet (500 mg total) by mouth every 6 (six) hours as needed for muscle spasms. 07/14/18   [name], MD  methylPREDNISolone (MEDROL DOSEPAK) 4 MG TBPK tablet 6-5-4-3-2-1-off 07/19/18   Inda Coke, PA  NEOMYCIN-POLYMYXIN-HYDROCORTISONE (CORTISPORIN) 1 % SOLN OTIC solution Apply 1-2 drops to toe BID after soaking 07/11/18   Hyatt, Max T, DPM  nitroGLYCERIN (NITROSTAT) 0.4 MG SL tablet Place 1 tablet (0.4 mg total) under the tongue every 5 (five) minutes as needed for chest pain. Reported on 01/05/2016 01/10/18   Leonie Man, MD  ondansetron (ZOFRAN-ODT) 4 MG disintegrating tablet Take 1 tablet (4 mg total) by mouth every 8 (eight) hours as needed for nausea or vomiting. 04/05/18   [name], MD  PARoxetine Mesylate (BRISDELLE) 7.5 MG CAPS Take 7.5 mg by mouth daily.    [provider]   Potassium Chloride ER 20 MEQ TBCR TAKE ONE AND ONE-HALF TABLETS BY MOUTH 2 TIMES DAILY 05/09/18   [name], MD  predniSONE (DELTASONE) 5 MG tablet Take 5 mg by mouth daily with breakfast.    [provider]  senna (SENOKOT) 8.6 MG TABS tablet Take 1 tablet (8.6 mg total) by mouth daily as needed for mild constipation or moderate constipation. 02/23/18   Vivi Barrack, MD  telmisartan (MICARDIS) 20 MG tablet TAKE 1 TABLET BY MOUTH EVERY DAY 02/27/18   [name], MD  triamcinolone cream (KENALOG) 0.1 % Apply 1 application topically 2 (two) times daily as needed (for rash. stop after 7-10 days.). 05/18/18   [name], MD  venlafaxine XR (EFFEXOR-XR) 75 MG 24 hr capsule Take 1 capsule (75 mg total) by mouth 2 (two) times daily. 12/14/17   [name], MD  Vitamin D-Vitamin K (K2 PLUS D3) [phone] MCG-UNIT TABS cholecalciferol (vit D3) 1,000 unit-vitamin K2 (MK4) 100 mcg tablet  Take by oral route.    [provider]  mupirocin nasal ointment (BACTROBAN) 2 % Place 1 application into the nose 2 (two) times daily. Use one-half of tube in each nostril twice daily for five (5) days. After application, press sides of nose together and gently massage. 03/28/18 03/28/18  [name], MD    Family History Family History  Problem Relation Age of Onset  . Diabetes Father   . Heart attack Father 33  . Coronary artery disease Father   . Heart failure Father   . Throat cancer Father   . COPD Mother   . Emphysema Mother   . Asthma Mother   . Heart failure Mother   . Breast cancer Mother   . Heart attack Maternal Grandfather   . Sarcoidosis  Maternal Uncle   . Lung cancer Brother   . Diabetes Brother   . Colon cancer Neg Hx   . Rectal cancer Neg Hx     Social History Social History   Tobacco Use  . Smoking status: Never Smoker  . Smokeless tobacco: Never Used  Substance Use Topics  . Alcohol use: No    Alcohol/week: 0.0 standard drinks  . Drug  use: No     Allergies   Methotrexate; Vancomycin; Lisinopril; Chlorhexidine; Clindamycin/lincomycin; Doxycycline; and Teflaro [ceftaroline]   Review of Systems Review of Systems  Constitutional: Negative for chills and fever.  HENT: Negative.   Eyes: Negative for photophobia and visual disturbance.  Respiratory: Negative.   Cardiovascular: Negative.   Gastrointestinal: Negative.  Negative for nausea.  Musculoskeletal: Negative.  Negative for neck pain.  Skin: Negative.   Neurological: Positive for headaches. Negative for syncope, weakness, light-headedness and numbness.     Physical Exam Updated Vital Signs BP (!) 147/64 (BP Location: Right Arm)   Pulse 84   Temp 97.6 F (36.4 C) (Oral)   Resp 16   Ht 5\' 6"  (1.676 m)   Wt 114.8 kg   SpO2 95%   BMI 40.84 kg/m   Physical Exam  Constitutional: She is oriented to person, place, and time. She appears well-developed and well-nourished.  HENT:  Head: Normocephalic.  Eyes: Pupils are equal, round, and reactive to light.  Neck: Normal range of motion. Neck supple.  Cardiovascular: Normal rate and regular rhythm.  Pulmonary/Chest: Effort normal and breath sounds normal.  Abdominal: Soft. Bowel sounds are normal. There is no tenderness. There is no rebound and no guarding.  Musculoskeletal: Normal range of motion.  Neurological: She is alert and oriented to person, place, and time. She has normal strength. She is not disoriented. No sensory deficit. Coordination and gait normal. GCS eye subscore is 4. GCS verbal subscore is 5. GCS motor subscore is 6.  CN's 3-12 intact.   Skin: Skin is warm and dry. No rash noted.  Psychiatric: She has a normal mood and affect. Her behavior is normal.  Nursing note and vitals reviewed.    ED Treatments / Results  Labs (all labs ordered are listed, but only abnormal results are displayed) Labs Reviewed  CBC WITH DIFFERENTIAL/PLATELET - Abnormal; Notable for the following components:       Result Value   WBC 12.3 (*)    Hemoglobin 10.4 (*)    HCT 33.8 (*)    Platelets 402 (*)    Neutro Abs 8.9 (*)    Abs Immature Granulocytes 0.21 (*)    All other components within normal limits  BASIC METABOLIC PANEL - Abnormal; Notable for the following components:   Sodium 134 (*)    Chloride 95 (*)    Glucose, Bld 332 (*)    BUN 24 (*)    Creatinine, Ser 1.10 (*)    GFR calc non Af Amer 54 (*)    All other components within normal limits    EKG None  Radiology No results found.  Procedures Procedures (including critical care time)  Medications Ordered in ED Medications  oxyCODONE-acetaminophen (PERCOCET/ROXICET) 5-325 MG per tablet 1 tablet (has no administration in time range)  ketorolac (TORADOL) 30 MG/ML injection 30 mg (has no administration in time range)     Initial Impression / Assessment and Plan / ED Course  I have reviewed the triage vital signs and the nursing notes.  Pertinent labs & imaging results that were available  during my care of the patient were reviewed by me and considered in my medical decision making (see chart for details).     Patient to ED with 2 weeks of sharp, stabbing right occipital pain. Treated with muscle relaxers without relief. No other neurologic symptoms, fever, nausea. The pain is always in the same location. No injury.  Labs are unremarkable for critical abnormalities. Hyperglycemia with history of diabetes, "normal". VSS. Patient given Percocet and IM Toradol with some relief. Head CT negative.   Will refer to neurology for further outpatient evaluation. Return precautions discussed.   Final Clinical Impressions(s) / ED Diagnoses   Final diagnoses:  None   1. Occipital headache  ED Discharge Orders    None       Charlann Lange, Hershal Coria 65/80/06 3494    Delora Fuel, MD 94/47/39 [phone]

## 2018-07-27 NOTE — Telephone Encounter (Signed)
I see the patient was seen in the emergency room last night.  Looks like the headaches have been bothering her more and they suggested neurology referral- I am okay with referral being placed for occipital headache (headache, unspecified type is ok) to neurology as per ER suggestion.  If she is still having the neck tension-I still think physical therapy may be helpful.

## 2018-07-27 NOTE — Discharge Instructions (Signed)
Take Percocet for pain as prescribed and follow up with neurology in the outpatient setting for further evaluation and management of right sided headache. Return to the ED with any new or concerning symptoms.

## 2018-07-27 NOTE — Progress Notes (Signed)
She presents today would like to go ahead and get scheduled for surgery regarding her hammertoe deformities and hallux malleus of her left foot.  States she would like to consider surgical repair of the right foot as well.  She states that her toenail seem to be healing very nicely she refers to the removal of the second nail plate of the right foot.  Objective: Vital signs are stable alert and oriented x3 pulses are strong and palpable.  Functional hammertoe deformities 2 through 5 the left foot are noted to be present.  She has a hallux malleus which is more rigid of the left foot.  Her nail plate removal is gone on to heal uneventfully and appears to be doing very well.  There is no signs of infection.  I have reviewed her radiographs taken today demonstrating severe hammertoe deformities.  Assessment: Well-healing surgic nail excision second toe right foot.  Hammertoe deformity with hallux malleus left foot.  Plan: Discussed etiology pathology and surgical therapies this point time consented her for her to repair #2 #3 #4 with screw fixation as well as a hallux interphalangeal joint fusion with screw fixation.  She understands this and is amenable to it.  We did discuss also discussed the pros and cons of surgery discussed possible postop complications which may include but are not limited to postop pain bleeding swelling infection recurrence need further surgery overcorrection under correction loss of digit loss of limb loss of life.  She signed a consent form today we provided her with both oral and written home-going instructions for her morning of her preop the surgery center as well as anesthesia group.  I will follow-up with her as time of surgery which appears to be mid November.  We also requesting medical clearance for her diabetes and cardiac clearance.

## 2018-07-27 NOTE — Patient Instructions (Signed)
Pre-Operative Instructions  Congratulations, you have decided to take an important step towards improving your quality of life.  You can be assured that the doctors and staff at [facility] will be with you every step of the way.  Here are some important things you should know:  1. Plan to be at the surgery center/hospital at least 1 (one) hour prior to your scheduled time, unless otherwise directed by the surgical center/hospital staff.  You must have a responsible adult accompany you, remain during the surgery and drive you home.  Make sure you have directions to the surgical center/hospital to ensure you arrive on time. 2. If you are having surgery at [facility] or [facility] hospitals, you will need a copy of your medical history and physical form from your family physician within one month prior to the date of surgery. We will give you a form for your primary physician to complete.  3. We make every effort to accommodate the date you request for surgery.  However, there are times where surgery dates or times have to be moved.  We will contact you as soon as possible if a change in schedule is required.   4. No aspirin/ibuprofen for one week before surgery.  If you are on aspirin, any non-steroidal anti-inflammatory medications (Mobic, Aleve, Ibuprofen) should not be taken seven (7) days prior to your surgery.  You make take Tylenol for pain prior to surgery.  5. Medications - If you are taking daily heart and blood pressure medications, seizure, reflux, allergy, asthma, anxiety, pain or diabetes medications, make sure you notify the surgery center/hospital before the day of surgery so they can tell you which medications you should take or avoid the day of surgery. 6. No food or drink after midnight the night before surgery unless directed otherwise by surgical center/hospital staff. 7. No alcoholic beverages 24-hours prior to surgery.  No smoking 24-hours prior or 24-hours after  surgery. 8. Wear loose pants or shorts. They should be loose enough to fit over bandages, boots, and casts. 9. Don't wear slip-on shoes. Sneakers are preferred. 10. Bring your boot with you to the surgery center/hospital.  Also bring crutches or a walker if your physician has prescribed it for you.  If you do not have this equipment, it will be provided for you after surgery. 11. If you have not been contacted by the surgery center/hospital by the day before your surgery, call to confirm the date and time of your surgery. 12. Leave-time from work may vary depending on the type of surgery you have.  Appropriate arrangements should be made prior to surgery with your employer. 13. Prescriptions will be provided immediately following surgery by your doctor.  Fill these as soon as possible after surgery and take the medication as directed. Pain medications will not be refilled on weekends and must be approved by the doctor. 14. Remove nail polish on the operative foot and avoid getting pedicures prior to surgery. 15. Wash the night before surgery.  The night before surgery wash the foot and leg well with water and the antibacterial soap provided. Be sure to pay special attention to beneath the toenails and in between the toes.  Wash for at least three (3) minutes. Rinse thoroughly with water and dry well with a towel.  Perform this wash unless told not to do so by your physician.  Enclosed: 1 Ice pack (please put in freezer the night before surgery)   1 Hibiclens skin cleaner     Pre-op instructions  If you have any questions regarding the instructions, please do not hesitate to call our office.  [place]: [address], [place], [place] [address] -- [phone]  [place]: [address] , [place] [address] -- [phone]  [place]: [address]  [place], [place] [address] -- [phone]  [place]: [address], [address], [place], [place] [address] -- [phone]  Website:  [email or web address] 

## 2018-07-27 NOTE — Telephone Encounter (Signed)
Please contact patient to schedule referral

## 2018-07-27 NOTE — Telephone Encounter (Signed)
Called and spoke to patient. ED placed a neuro referral. I also offered physical therapy again and patient would like to be scheduled.

## 2018-07-27 NOTE — ED Notes (Signed)
Patient transported to CT 

## 2018-07-27 NOTE — ED Notes (Signed)
Pt stable and ambulatory for discharge, states understanding follow up.  

## 2018-07-28 ENCOUNTER — Ambulatory Visit (INDEPENDENT_AMBULATORY_CARE_PROVIDER_SITE_OTHER): Payer: BLUE CROSS/BLUE SHIELD | Admitting: Neurology

## 2018-07-28 ENCOUNTER — Encounter: Payer: Self-pay | Admitting: Neurology

## 2018-07-28 VITALS — BP 124/60 | HR 76 | Ht 66.0 in | Wt 255.2 lb

## 2018-07-28 DIAGNOSIS — M542 Cervicalgia: Secondary | ICD-10-CM

## 2018-07-28 DIAGNOSIS — E1142 Type 2 diabetes mellitus with diabetic polyneuropathy: Secondary | ICD-10-CM

## 2018-07-28 DIAGNOSIS — M62838 Other muscle spasm: Secondary | ICD-10-CM

## 2018-07-28 MED ORDER — CYCLOBENZAPRINE HCL 5 MG PO TABS: ORAL_TABLET | ORAL | 1 refills | Status: DC

## 2018-07-28 NOTE — Patient Instructions (Signed)
Start flexeril 5mg  at bedtime x 3 days, then increase to 1 tablet twice daily  Start neck physiotherapy  Try heat or ice to your neck  If your pain gets worse, call my office and we can schedule a nerve block

## 2018-07-28 NOTE — Progress Notes (Signed)
[facility] Clinic Note - Initial Visit   Date: 07/28/18  [name] MRN: [id number] DOB: [id number]   Dear Dr. Roxanne Mins:  Thank you for your kind referral of [name] for consultation of headache. Although her history is well known to you, please allow [place] to reiterate it for the purpose of our medical record. The patient was accompanied to the clinic by husband who also provides collateral information.     History of Present Illness: [name] is a 57 y.o. right-handed Caucasian female with diabetes mellitus complicated by neuropathy and renal insufficiency, gout, hyperlipidemia, depression, hypertension,CHF, s/p gastric bypass surgery, COPD, and sarcoidosis presenting for evaluation of headache.    Two weeks ago, she began having achy pain over the right side of the neck, which is worse at the base of the head and radiates into the midline and right scapula region.  She does not have any radiation of pain into her scalp. Pain is constant and can get worse throughout the day and triggered by neck movement.  She has tried methocarbamol, heat, acetaminophen, and steroid taper about 10-days ago.  She went to the ER on 10/16 due to progressive pain and was give toradol injection which eased her pain some.  CT head was negative. She does not have numbness/tingling of the hands or weakness of the arms.  She has know diabetes complicated by neuropathy manifesting with numbness/tingling of the toes.  She takes gabapentin 100mg  three times daily.   Out-side paper records, electronic medical record, and images have been reviewed where available and summarized as:  CT head 07/27/2018:  Negative  Past Medical History:  Diagnosis Date  . Abnormal SPEP 04/17/2014  . Acute left ankle pain 01/26/2017  . ANEMIA-UNSPECIFIED 09/18/2009  . Anxiety   . Arthritis   . Chronic diastolic heart failure, NYHA class 2 (HCC)    Normal LVEDP by May 2018  . COPD  (chronic obstructive pulmonary disease) ([place])   . Depression   . DIABETES MELLITUS, TYPE II 08/21/2006  . Diabetic osteomyelitis ([place]) 05/29/2015  . Fracture of 5th metatarsal    non union  . GERD 08/21/2006  . GOUT 08/20/2010  . Hx of umbilical hernia repair   . HYPERLIPIDEMIA 08/21/2006  . HYPERTENSION 08/21/2006  . Infection of wound due to methicillin resistant Staphylococcus aureus (MRSA)   . Internal hemorrhoids   . Multiple allergies 10/14/2016  . OBESITY 06/04/2009  . Onychomycosis 10/27/2015  . Osteomyelitis of left foot ([place]) 05/29/2015  . Pulmonary sarcoidosis ([place])    Followed locally by pulmonology, but also by Dr. Casper Harrison at [facility]  . Right knee pain 01/26/2017  . Vocal cord dysfunction   . Wears partial dentures     Past Surgical History:  Procedure Laterality Date  . ABDOMINAL HYSTERECTOMY    . APPENDECTOMY    . BLADDER SUSPENSION  11/11/2011   Procedure: TRANSVAGINAL TAPE (TVT) PROCEDURE;  Surgeon: Olga Millers, MD;  Location: [facility];  Service: Gynecology;  Laterality: N/A;  . CAROTIDS  02/18/11   CAROTID DUPLEX; VERTEBRALS ARE PATENT WITH ANTEGRADE FLOW. ICA/CCA RATIO 1.61 ON RIGHT AND 0.75 ON LEFT  . CHOLECYSTECTOMY  1984  . CYSTOSCOPY  11/11/2011   Procedure: CYSTOSCOPY;  Surgeon: Olga Millers, MD;  Location: [facility];  Service: Gynecology;  Laterality: N/A;  . EXTUBATION (ENDOTRACHEAL) IN OR N/A 09/23/2017   Procedure: EXTUBATION (ENDOTRACHEAL) IN OR;  Surgeon: Helayne Seminole, MD;  Location: [facility];  Service: ENT;  Laterality: N/A;  . FIBEROPTIC LARYNGOSCOPY AND TRACHEOSCOPY N/A 09/23/2017   Procedure: FLEXIBLE FIBEROPTIC LARYNGOSCOPY;  Surgeon: Helayne Seminole, MD;  Location: [facility];  Service: ENT;  Laterality: N/A;  . FRACTURE SURGERY     foot  . HERNIA REPAIR    . I&D EXTREMITY Left 06/27/2015   Procedure: Partial Excision Left Calcaneus, Place Antibiotic Beads, and Wound VAC;  Surgeon: Newt Minion, MD;  Location: [facility];  Service:  Orthopedics;  Laterality: Left;  . KNEE ARTHROSCOPY     right  . LEFT AND RIGHT HEART CATHETERIZATION WITH CORONARY ANGIOGRAM N/A 04/23/2013   Procedure: LEFT AND RIGHT HEART CATHETERIZATION WITH CORONARY ANGIOGRAM;  Surgeon: Leonie Man, MD;  Location[facility];  Service: Cardiovascular;  Laterality: N/A;  . LEFT HEART CATH AND CORONARY ANGIOGRAPHY N/A 03/11/2017   Procedure: Left Heart Cath and Coronary Angiography;  Surgeon: Sherren Mocha, MD;  Location: [facility] CV LAB; angiographically minimal CAD in the LAD otherwise normal.;  Normal LVEDP.  FALSE POSITIVE MYOVIEW  . LEFT HEART CATH AND CORONARY ANGIOGRAPHY  07/20/2010   LVEF 50-55% WITH VERY MILD GLOBAL HYPOKINESIA; ESSENTIALLY NORMAL CORONARY ARTERIES; NORMAL LV FUNCTION  . METATARSAL OSTEOTOMY WITH OPEN REDUCTION INTERNAL FIXATION (ORIF) METATARSAL WITH FUSION Left 04/09/2014   Procedure: LEFT FOOT FRACTURE OPEN TREATMENT METATARSAL INCLUDES INTERNAL FIXATION EACH;  Surgeon: Lorn Junes, MD;  Location: [facility];  Service: Orthopedics;  Laterality: Left;  . NISSEN FUNDOPLICATION  8938  . NM MYOVIEW LTD  03/09/2013   Lexiscan --> EF 50%; NORMAL MYOCARDIAL PERFUSION STUDY - breast attenuation  . NM MYOVIEW LTD  03/10/2017   : Moderate size "stress-induced "perfusion defect at the apex as well as "ill-defined stress-induced perfusion defect in the lateral wall.  EF 55%.  INTERMEDIATE risk. -->  FALSE POSITIVE  . Right and left CARDIAC CATHETERIZATION  04/23/2013   Angiographic normal coronaries; LVEDP 20 mmHg, PCWP 12-14 mmHg, RAP 12 mmHg.; Fick CO/CI 4.9/2.2  . TOTAL KNEE ARTHROPLASTY Right 06/29/2017   Procedure: RIGHT TOTAL KNEE ARTHROPLASTY;  Surgeon: Newt Minion, MD;  Location: [facility];  Service: Orthopedics;  Laterality: Right;  . TOTAL KNEE ARTHROPLASTY Left 12/07/2017  . TOTAL KNEE ARTHROPLASTY Left 12/07/2017   Procedure: LEFT TOTAL KNEE ARTHROPLASTY;  Surgeon: Newt Minion, MD;  Location: [facility];   Service: Orthopedics;  Laterality: Left;  . TRACHEOSTOMY TUBE PLACEMENT N/A 09/20/2017   Procedure: AWAKE INTUBATION WITH ANESTHESIA WITH VIDEO ASSISTANCE;  Surgeon: Helayne Seminole, MD;  Location: [facility];  Service: ENT;  Laterality: N/A;  . TRANSTHORACIC ECHOCARDIOGRAM  08/2014   Normal LV size and function.  Mild LVH.  EF 55-60%.  Normal regional wall motion.  GR 1 DD.  Normal RV size and function .  [name] TUBAL LIGATION     with reversal in 1994  . VENTRAL HERNIA REPAIR       Medications:  Outpatient Encounter Medications as of 07/28/2018  Medication Sig Note  . albuterol (PROVENTIL HFA;VENTOLIN HFA) 108 (90 Base) MCG/ACT inhaler Inhale 1-2 puffs into the lungs every 6 (six) hours as needed for wheezing or shortness of breath.   . allopurinol (ZYLOPRIM) 100 MG tablet Take 100 mg by mouth 2 (two) times daily.    [name] aspirin EC 81 MG tablet Take 81 mg by mouth daily.    [name] atorvastatin (LIPITOR) 40 MG tablet TAKE 1 TAB BY MOUTH ONCE DAILY (Patient taking differently: Take 40 mg by mouth once  a day)   . BAYER MICROLET LANCETS lancets Use as instructed to check sugar 3 times daily   . buPROPion (WELLBUTRIN XL) 300 MG 24 hr tablet Take 1 tablet (300 mg total) by mouth daily.   . Calcium Citrate 200 MG TABS Take 200 mg by mouth daily.   . carvedilol (COREG) 25 MG tablet TAKE 1 TABLET (25 MG TOTAL) BY MOUTH 2 (TWO) TIMES DAILY WITH A MEAL.   [name] Cholecalciferol (VITAMIN D) 2000 units tablet Take 2,000 Units by mouth daily.   . Cyanocobalamin (VITAMIN B-12 PO) Take 2,500 mcg by mouth daily.    [name] DEXILANT 60 MG capsule TAKE 1 CAPSULE BY MOUTH EVERY DAY   . dicyclomine (BENTYL) 20 MG tablet Take one tablet 15 minutes before meals   . diltiazem (CARDIZEM CD) 120 MG 24 hr capsule TAKE 1 CAPSULE BY MOUTH EVERY DAY (FOR FUTURE REFILLS MAKE A DOCTORS VISIT)   . fenofibrate (TRICOR) 145 MG tablet TAKE 1 TABLET BY MOUTH EVERY DAY   . fluticasone (FLONASE) 50 MCG/ACT nasal spray Place 2 sprays into both  nostrils daily as needed for allergies.   . fluticasone furoate-vilanterol (BREO ELLIPTA) 200-25 MCG/INH AEPB Inhale 1 puff into the lungs daily.   . furosemide (LASIX) 40 MG tablet TAKE 1 TABLET BY MOUTH TWICE A DAY   . gabapentin (NEURONTIN) 100 MG capsule TAKE 1 CAPSULE BY MOUTH THREE TIMES A DAY   . glucose blood (BAYER CONTOUR TEST) test strip Use as instructed to check sugar 3 times daily   . insulin aspart (NOVOLOG FLEXPEN) 100 UNIT/ML FlexPen Inject 35-40 Units into the skin 3 (three) times daily with meals.   . Insulin Glargine (TOUJEO SOLOSTAR) 300 UNIT/ML SOPN Inject 40 Units into the skin at bedtime.   . Insulin Pen Needle 33G X 4 MM MISC 1 each by Does not apply route 4 (four) times daily.   [name] ipratropium (ATROVENT) 0.06 % nasal spray Place 2 sprays into both nostrils 4 (four) times daily.   . isosorbide mononitrate (IMDUR) 30 MG 24 hr tablet Take 1 tablet (30 mg total) by mouth at bedtime.   [name] KLOR-CON M20 20 MEQ tablet TAKE 1 & 1/2 TABLETS BY MOUTH TWICE A DAY   . linaclotide (LINZESS) 290 MCG CAPS capsule Take 1 capsule (290 mcg total) by mouth daily before breakfast.   . LORazepam (ATIVAN) 0.5 MG tablet Take 0.5 mg by mouth every 12 (twelve) hours as needed for anxiety.   . metFORMIN (GLUCOPHAGE) 1000 MG tablet TAKE 1 TABLET BY MOUTH TWICE A DAY WITH A MEAL   . methocarbamol (ROBAXIN) 500 MG tablet Take 1 tablet (500 mg total) by mouth every 6 (six) hours as needed for muscle spasms.   . methylPREDNISolone (MEDROL DOSEPAK) 4 MG TBPK tablet 6-5-4-3-2-1-off   . NEOMYCIN-POLYMYXIN-HYDROCORTISONE (CORTISPORIN) 1 % SOLN OTIC solution Apply 1-2 drops to toe BID after soaking   . nitroGLYCERIN (NITROSTAT) 0.4 MG SL tablet Place 1 tablet (0.4 mg total) under the tongue every 5 (five) minutes as needed for chest pain. Reported on 01/05/2016   . ondansetron (ZOFRAN-ODT) 4 MG disintegrating tablet Take 1 tablet (4 mg total) by mouth every 8 (eight) hours as needed for nausea or vomiting.   [name]  oxyCODONE-acetaminophen (PERCOCET/ROXICET) 5-325 MG tablet Take 1 tablet by mouth every 4 (four) hours as needed for severe pain.   [name] PARoxetine Mesylate (BRISDELLE) 7.5 MG CAPS Take 7.5 mg by mouth daily.   . Potassium Chloride ER 20 MEQ  TBCR TAKE ONE AND ONE-HALF TABLETS BY MOUTH 2 TIMES DAILY   . predniSONE (DELTASONE) 5 MG tablet Take 5 mg by mouth daily with breakfast. 09/20/2017: ONGOING  . senna (SENOKOT) 8.6 MG TABS tablet Take 1 tablet (8.6 mg total) by mouth daily as needed for mild constipation or moderate constipation.   [name] telmisartan (MICARDIS) 20 MG tablet TAKE 1 TABLET BY MOUTH EVERY DAY   . triamcinolone cream (KENALOG) 0.1 % Apply 1 application topically 2 (two) times daily as needed (for rash. stop after 7-10 days.).   [name] venlafaxine XR (EFFEXOR-XR) 75 MG 24 hr capsule Take 1 capsule (75 mg total) by mouth 2 (two) times daily.   . Vitamin D-Vitamin K (K2 PLUS D3) [phone] MCG-UNIT TABS cholecalciferol (vit D3) 1,000 unit-vitamin K2 (MK4) 100 mcg tablet  Take by oral route.   . [DISCONTINUED] mupirocin nasal ointment (BACTROBAN) 2 % Place 1 application into the nose 2 (two) times daily. Use one-half of tube in each nostril twice daily for five (5) days. After application, press sides of nose together and gently massage.    No facility-administered encounter medications on file as of 07/28/2018.      Allergies:  Allergies  Allergen Reactions  . Methotrexate Other (See Comments)    Peri-oral and buccal lesions  . Vancomycin Other (See Comments)    DOSE RELATED NEPHROTOXICITY  . Lisinopril Cough  . Chlorhexidine Itching  . Clindamycin/Lincomycin Nausea And Vomiting and Rash    RASH > ALLERGY INTOLERANCE > NAUSEA & VOMITING  . Doxycycline Rash  . Teflaro [Ceftaroline] Rash    Tolerates ceftriaxone     Family History: Family History  Problem Relation Age of Onset  . Diabetes Father   . Heart attack Father 68  . Coronary artery disease Father   . Heart failure Father    . Throat cancer Father   . COPD Mother   . Emphysema Mother   . Asthma Mother   . Heart failure Mother   . Breast cancer Mother   . Heart attack Maternal Grandfather   . Sarcoidosis Maternal Uncle   . Lung cancer Brother   . Diabetes Brother   . Colon cancer Neg Hx   . Rectal cancer Neg Hx     Social History: Social History   Tobacco Use  . Smoking status: Never Smoker  . Smokeless tobacco: Never Used  Substance Use Topics  . Alcohol use: No    Alcohol/week: 0.0 standard drinks  . Drug use: No   Social History   Social History Narrative   Married 1994. 2 sons who both live close and 1 grandson.       Disability due to sarcoidosis. 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   EYES: No visual changes or eye pain ENT: No hearing changes.  No history of nose bleeds.   RESPIRATORY: No cough, wheezing and shortness of breath.   CARDIOVASCULAR: Negative for chest pain, and palpitations.   GI: Negative for abdominal discomfort, blood in stools or black stools.  No recent change in bowel habits.   GU:  No history of incontinence.   MUSCLOSKELETAL: No history of joint pain or swelling.  +myalgias.   SKIN: Negative for lesions, rash, and itching.   HEMATOLOGY/ONCOLOGY:  Negative for prolonged bleeding, bruising easily, and swollen nodes.  No history of cancer.   ENDOCRINE: Negative for cold or heat intolerance, polydipsia or goiter.   PSYCH:  +depression or anxiety symptoms.   NEURO: As Above.   Vital Signs:  BP 124/60   Pulse 76   Ht 5\' 6"  (1.676 m)   Wt 255 lb 4 oz (115.8 kg)   SpO2 98%   BMI 41.20 kg/m    General Medical Exam:   General:  Well appearing, comfortable.   Eyes/ENT: see cranial nerve examination.   Neck: No masses  appreciated.  She has reduced neck extension, rotation to right and left, as well as bilateral sidebending.  Flexion is intact.  She has marked tenderness to palpation over the right suboccipital region and cervical paraspinal muscles.  There is tenderness over the GON and LON region, but no radiation into the scalp. No carotid bruits. Respiratory:  Clear to auscultation, good air entry bilaterally.   Cardiac:  Regular rate and rhythm, no murmur.   Extremities:  No deformities, edema, or skin discoloration.  Skin:  No rashes or lesions.  Neurological Exam: MENTAL STATUS including orientation to time, place, person, recent and remote memory, attention span and concentration, language, and fund of knowledge is normal.  Speech is not dysarthric.  CRANIAL NERVES: II:  No visual field defects.  Unremarkable fundi.   III-IV-VI: Pupils equal round and reactive to light.  Normal conjugate, extra-ocular eye movements in all directions of gaze.  No nystagmus.  No ptosis.   V:  Normal facial sensation.     VII:  Normal facial symmetry and movements.  No pathologic facial reflexes.  VIII:  Normal hearing and vestibular function.   IX-X:  Normal palatal movement.   XI:  Normal shoulder shrug and head rotation.   XII:  Normal tongue strength and range of motion, no deviation or fasciculation.  MOTOR:  Motor strength is 5/5 throughout.  No atrophy, fasciculations or abnormal movements.  No pronator drift.  Tone is normal.    MSRs:  Right                                                                 Left brachioradialis 2+  brachioradialis 2+  biceps 2+  biceps 2+  triceps 2+  triceps 2+  patellar 2+  patellar 2+  ankle jerk 0  ankle jerk 0   SENSORY:  Vibration is mildly reduced at the ankles, otherwise sensation to all modalities intact.  COORDINATION/GAIT: Normal finger-to- nose-finger.  Intact rapid alternating movements bilaterally.  Gait narrow based and stable.     IMPRESSION: 1.   Cervical muscle spasm affecting the right side.  Exam shows marked tenderness over the right suboccipital and cervical region, as well as reduced range of motion of the neck.  She does not have classic signs of occipital neuralgia and lacks radiation of symptoms into her scalp, therefore it was mutually decided not to perform nerve block.  She has already tried steroid taper with no relief.  Toradol injection eased some of her pain.  Instead, I will start her on a scheduled dose of flexeril 5mg  at bedtime and titrate to 5mg  BID.  She will also be started physical therapy for neck stretching next  week.  If pain persists, nerve block can be reconsidered. I instructed her to avoid percocet as underlying pathology for pain is muscle spasm and muscle relaxants and PT will provide the greatest benefit.     2.  Diabetic peripheral neuropathy affecting the feet, followed by Dr. Loanne Drilling.  Symptoms well-controlled on gabapentin 100mg  TID.   Thank you for allowing me to participate in patient's care.  If I can answer any additional questions, I would be pleased to do so.    Sincerely,    Alexsis Kathman K. Posey Pronto, DO

## 2018-07-31 IMAGING — CT CT NECK W/ CM
4 of 6 series · 13 of 33 positions shown, 15 images · IV contrast (iopamidol)
Comparison: 11/30/2015

CLINICAL DATA: Sore throat with difficulty swallowing for 2 days.
Leukocytosis.

EXAM:
CT NECK WITH CONTRAST
TECHNIQUE: Multidetector CT imaging of the neck was performed using the
standard protocol following the bolus administration of intravenous
contrast.
CONTRAST:  75mL IITEP1-0FF IOPAMIDOL (IITEP1-0FF) INJECTION 61%

[Series 5: neck 2.0 i70h 3 · axial · 0.48mm/px · z∈[-269,-193]mm · 2 of 114 slices shown]
[im 38/114  bone]
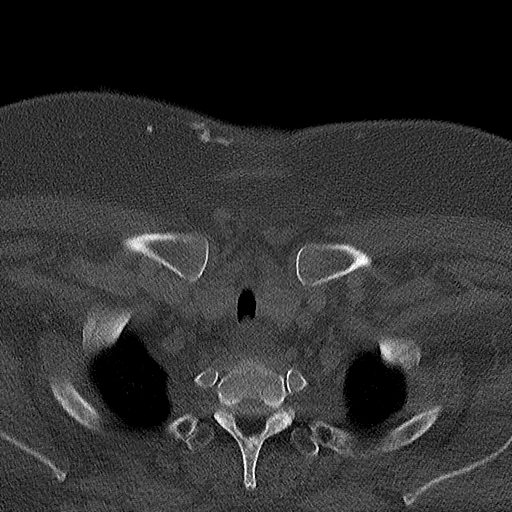
[im 76/114  bone]
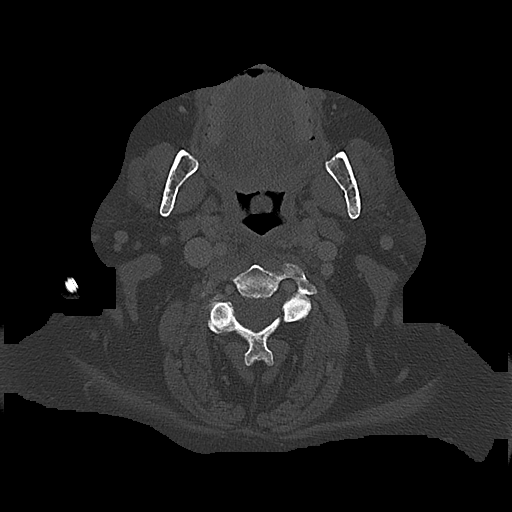

[Series 7: sagittal st · sagittal · 0.47mm/px · 5 of 105 slices shown, 6 images]
[im 35/105  bone]
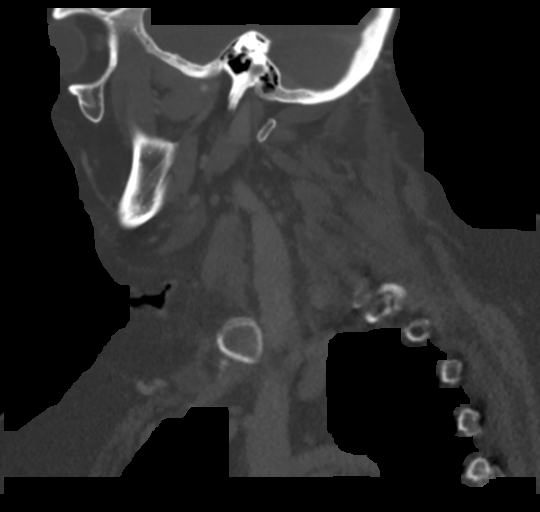
[im 44/105  bone]
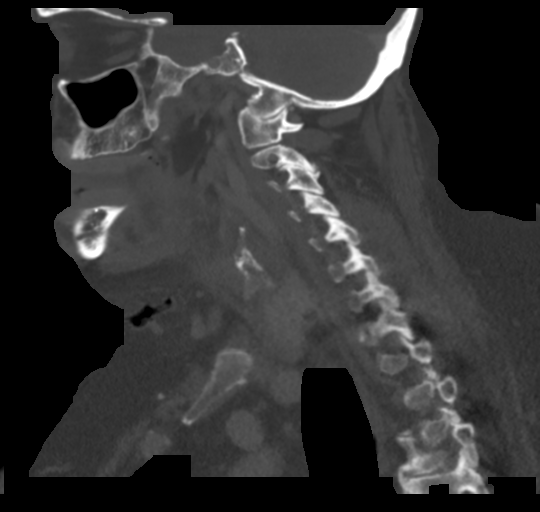
[im 53/105  soft-tissue]
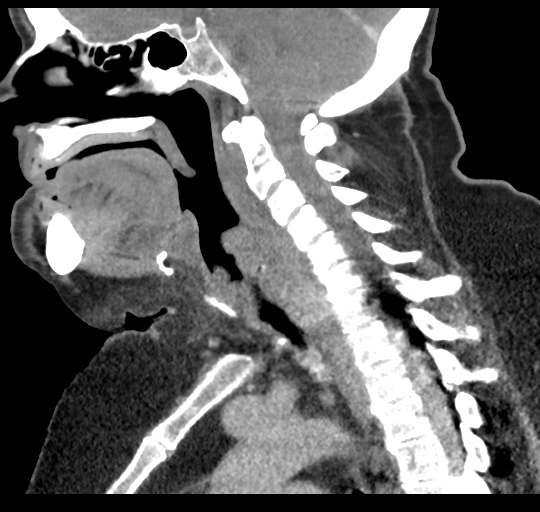
[im 53/105  bone]
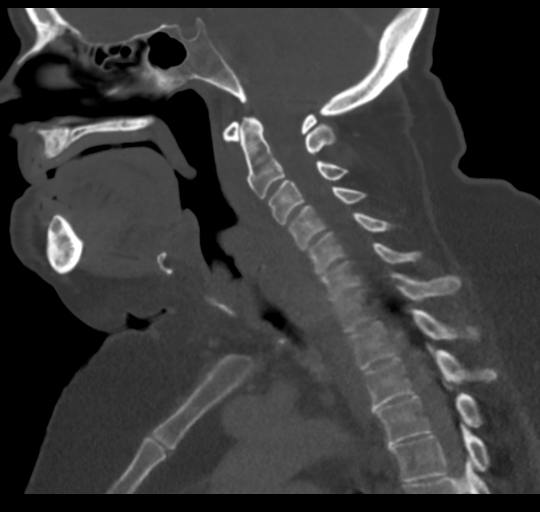
[im 61/105  bone]
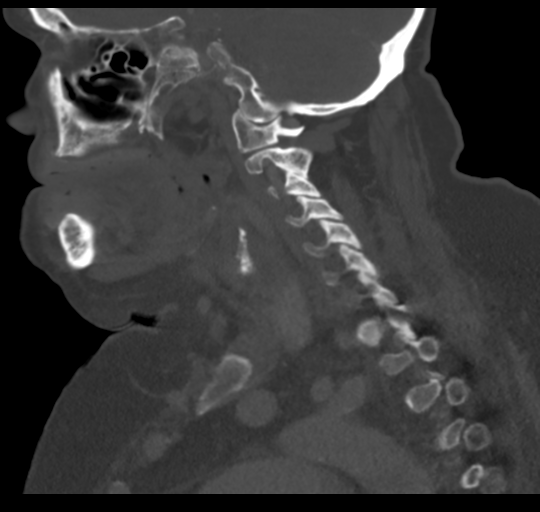
[im 70/105  bone]
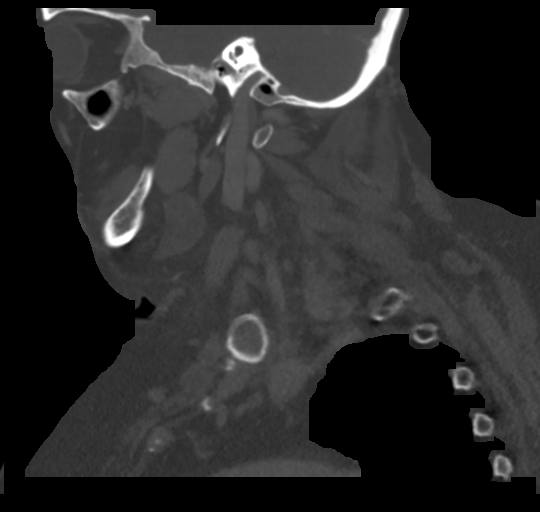

[Series 8: coronal st · coronal · 0.47mm/px · 3 of 124 slices shown]
[im 25/124  bone]
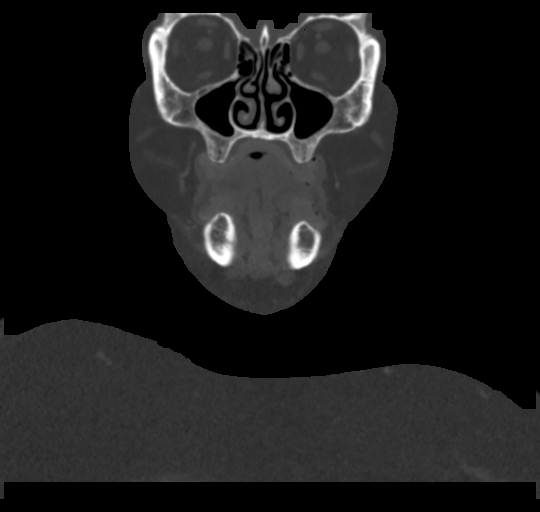
[im 50/124  bone]
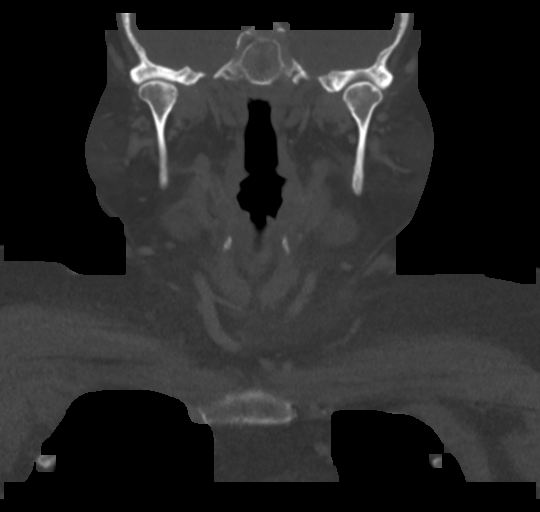
[im 74/124  bone]
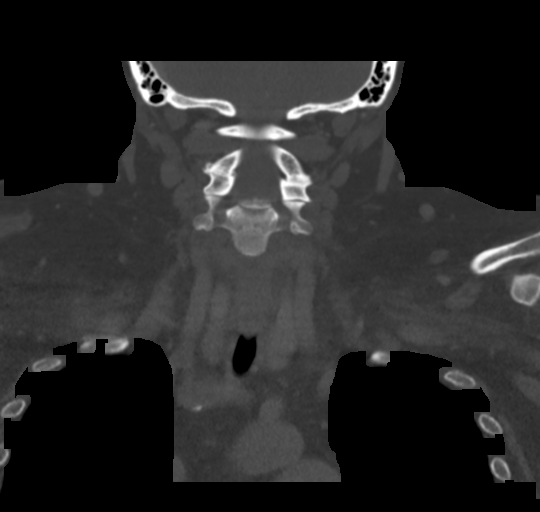

[Series 9: orthogonal st · axial · 0.42mm/px · z∈[-330,-210]mm · 3 of 136 slices shown, 4 images]
[im 34/136  soft-tissue]
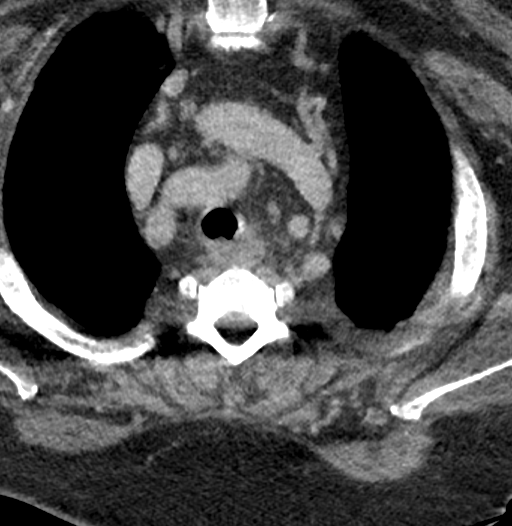
[im 34/136  bone]
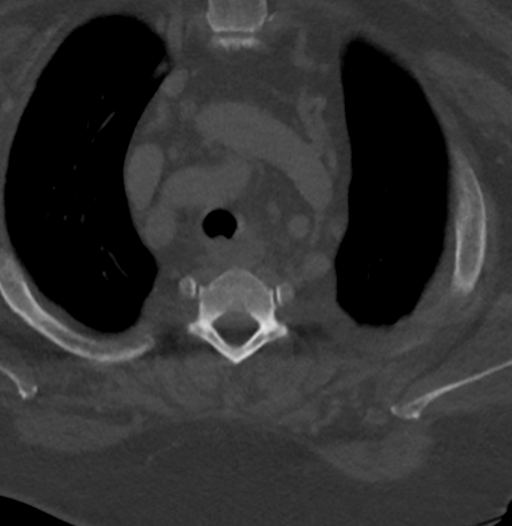
[im 68/136  bone]
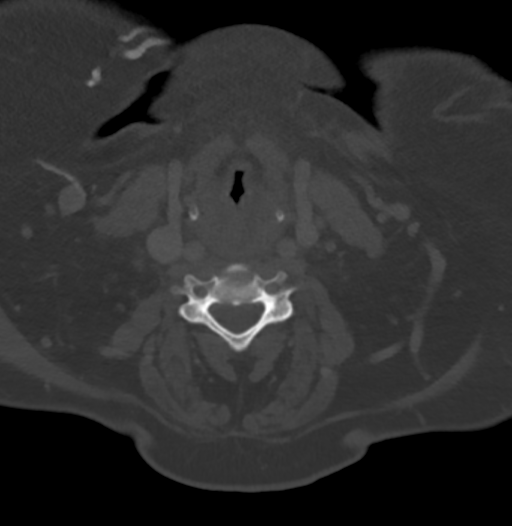
[im 102/136  bone]
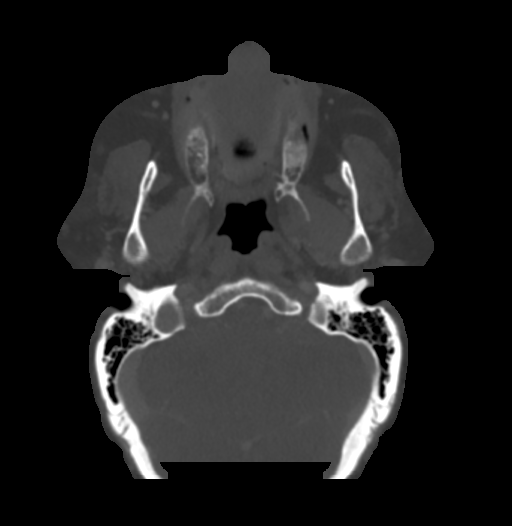

[13 of 33 positions shown; findings below may reference images not displayed]

FINDINGS: Pharynx and larynx: There is diffuse soft tissue thickening
involving the posterior oropharyngeal wall inferiorly, with more
extensive soft tissue thickening throughout the hypopharynx. The
epiglottis is thickened, as are the aryepiglottic folds (greater on
the left). No focal mass is identified. There is mild
retropharyngeal edema and mild parapharyngeal fat stranding. No
abscess is identified. The supraglottic airway is mildly narrowed.

Salivary glands: No inflammation, mass, or stone.

Thyroid: Unremarkable.

Lymph nodes: No enlarged or suspicious lymph nodes in the neck.

Vascular: The major vascular structures of the neck appear patent.

Limited intracranial: Unremarkable.

Visualized orbits: Bilateral cataract extraction.

Mastoids and visualized paranasal sinuses: Clear.

Skeleton: No acute osseous abnormality or suspicious osseous lesion.

Upper chest: Clear lung apices.

Other: None.
IMPRESSION: Diffuse inferior pharyngeal soft tissue thickening with involvement
of the epiglottis and supraglottic larynx, likely infectious.
Correlate with direct visualization and clinical follow-up to
exclude neoplasm. No abscess.

## 2018-07-31 IMAGING — DX DG CHEST 2V
2 series · 2 of 2 positions shown · non-contrast
Comparison: August 12, 2017.

CLINICAL DATA: Pain and dysphagia

EXAM:
CHEST  2 VIEW

[chest pa]
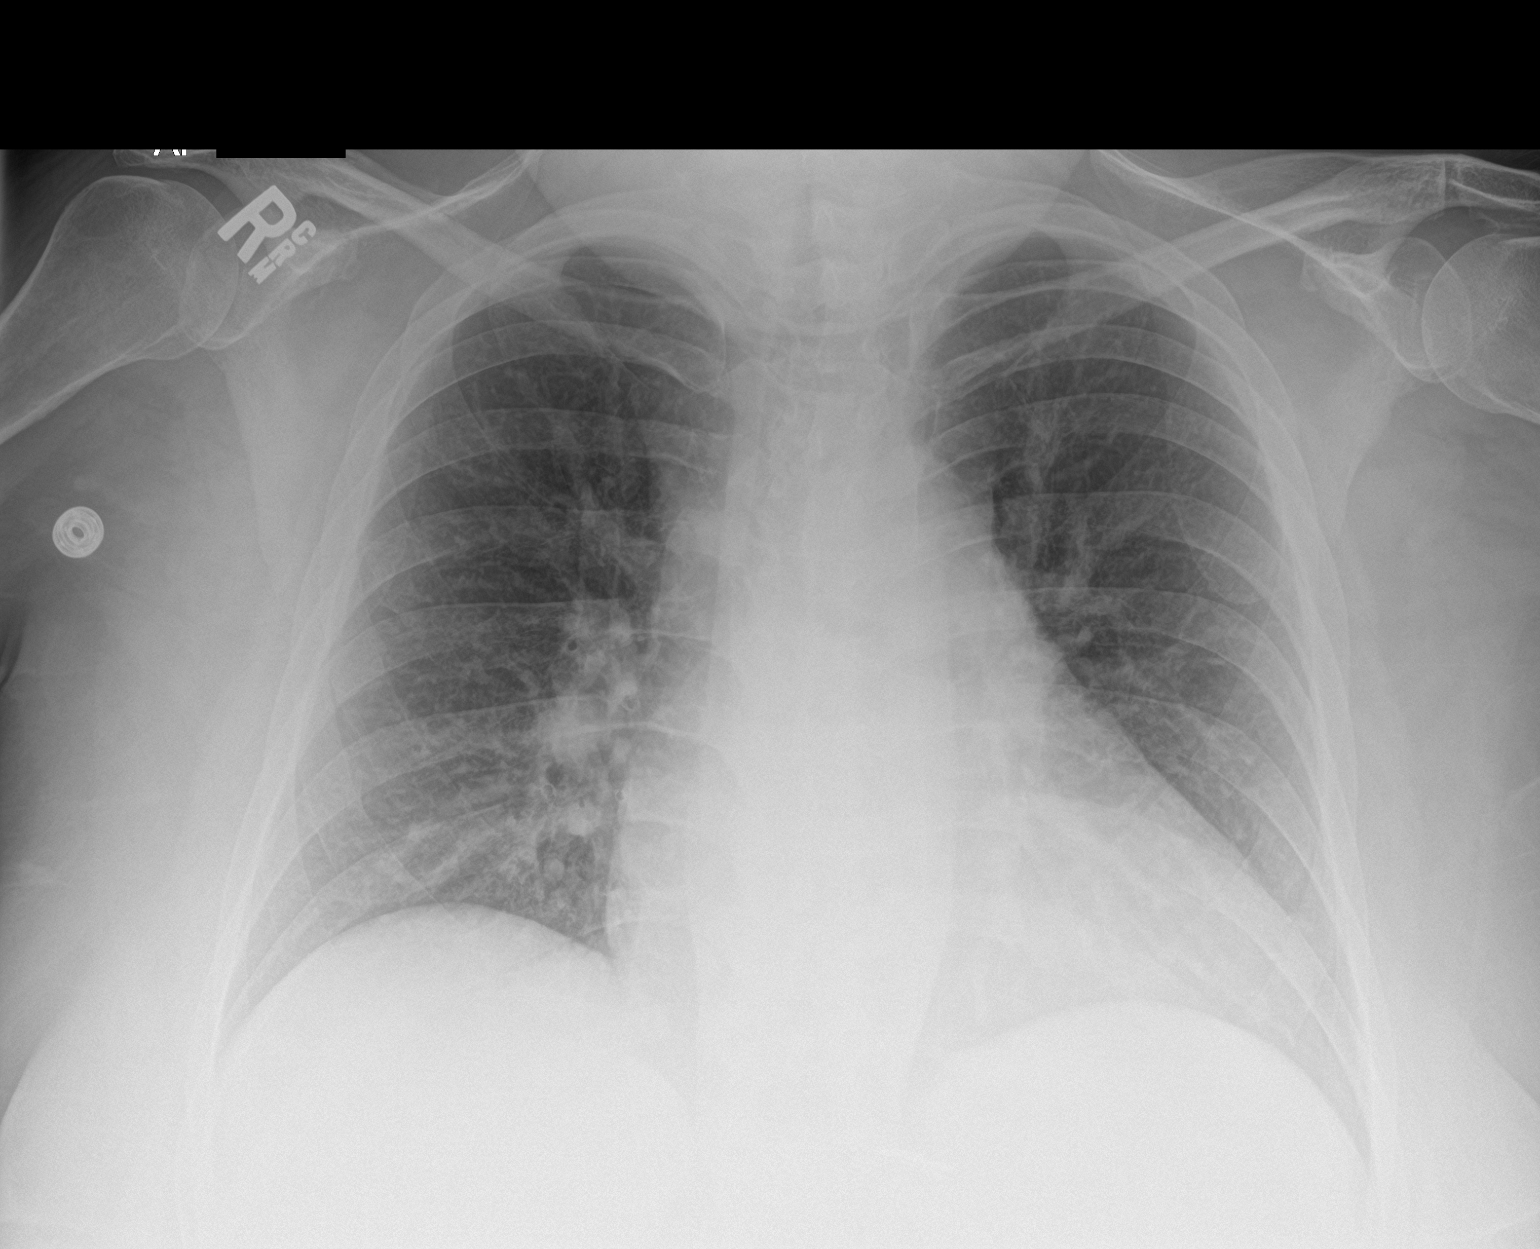

[chest lat]
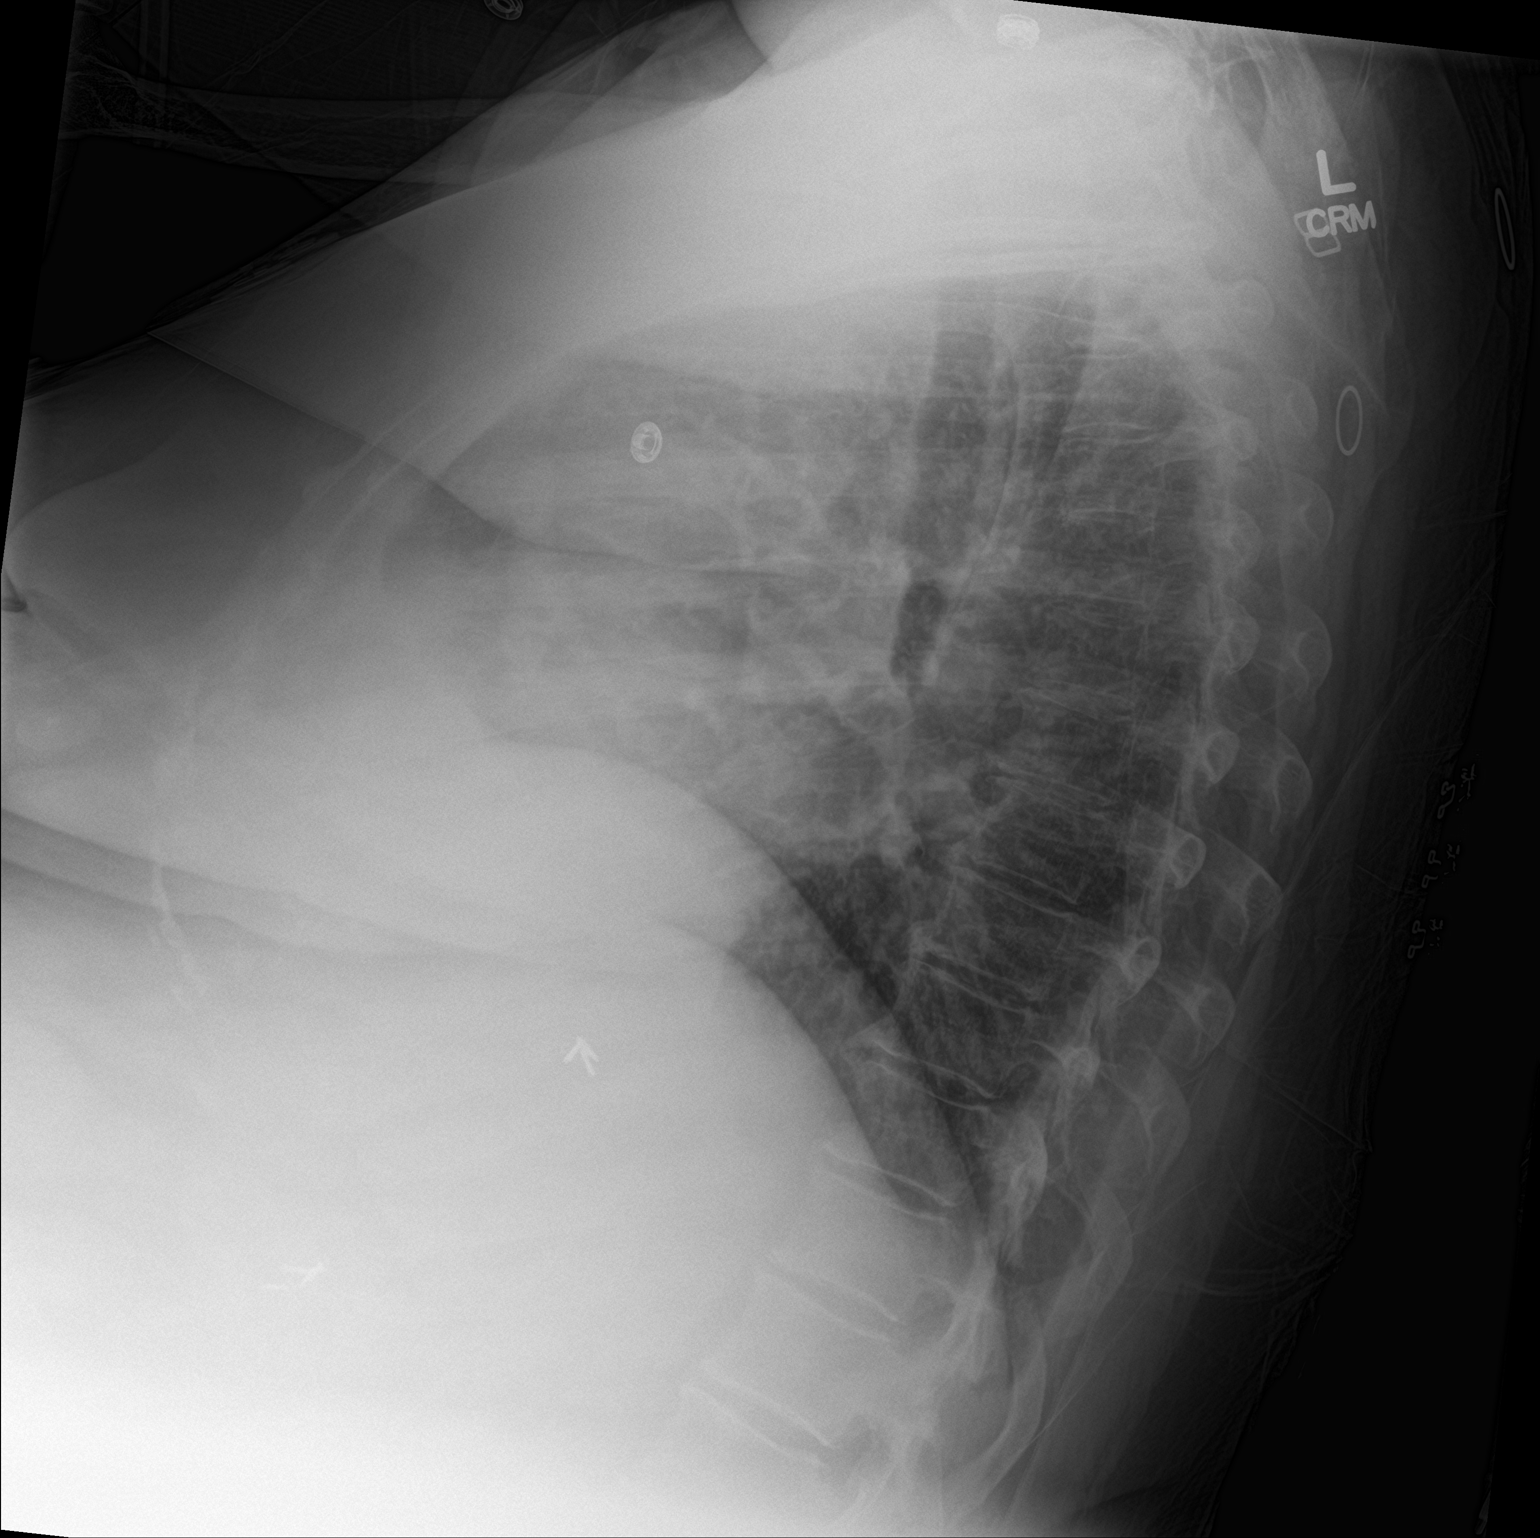

[2 of 2 positions shown; findings below may reference images not displayed]

FINDINGS: There is no edema or consolidation. Heart is slightly enlarged with
pulmonary vascularity within normal limits. No adenopathy. No bone
lesions. No pneumothorax or pneumomediastinum.
IMPRESSION: Heart mildly enlarged.  No edema or consolidation.

## 2018-08-01 ENCOUNTER — Ambulatory Visit (INDEPENDENT_AMBULATORY_CARE_PROVIDER_SITE_OTHER): Payer: BLUE CROSS/BLUE SHIELD | Admitting: Physical Therapy

## 2018-08-01 ENCOUNTER — Encounter: Payer: Self-pay | Admitting: Physical Therapy

## 2018-08-01 DIAGNOSIS — R252 Cramp and spasm: Secondary | ICD-10-CM

## 2018-08-01 DIAGNOSIS — R293 Abnormal posture: Secondary | ICD-10-CM | POA: Diagnosis not present

## 2018-08-01 DIAGNOSIS — M542 Cervicalgia: Secondary | ICD-10-CM

## 2018-08-01 IMAGING — DX DG CHEST 1V PORT
1 series · 1 of 1 positions shown · non-contrast
Comparison: September 20, 2017

CLINICAL DATA: Hypoxia

EXAM:
PORTABLE CHEST 1 VIEW

[chest ap]
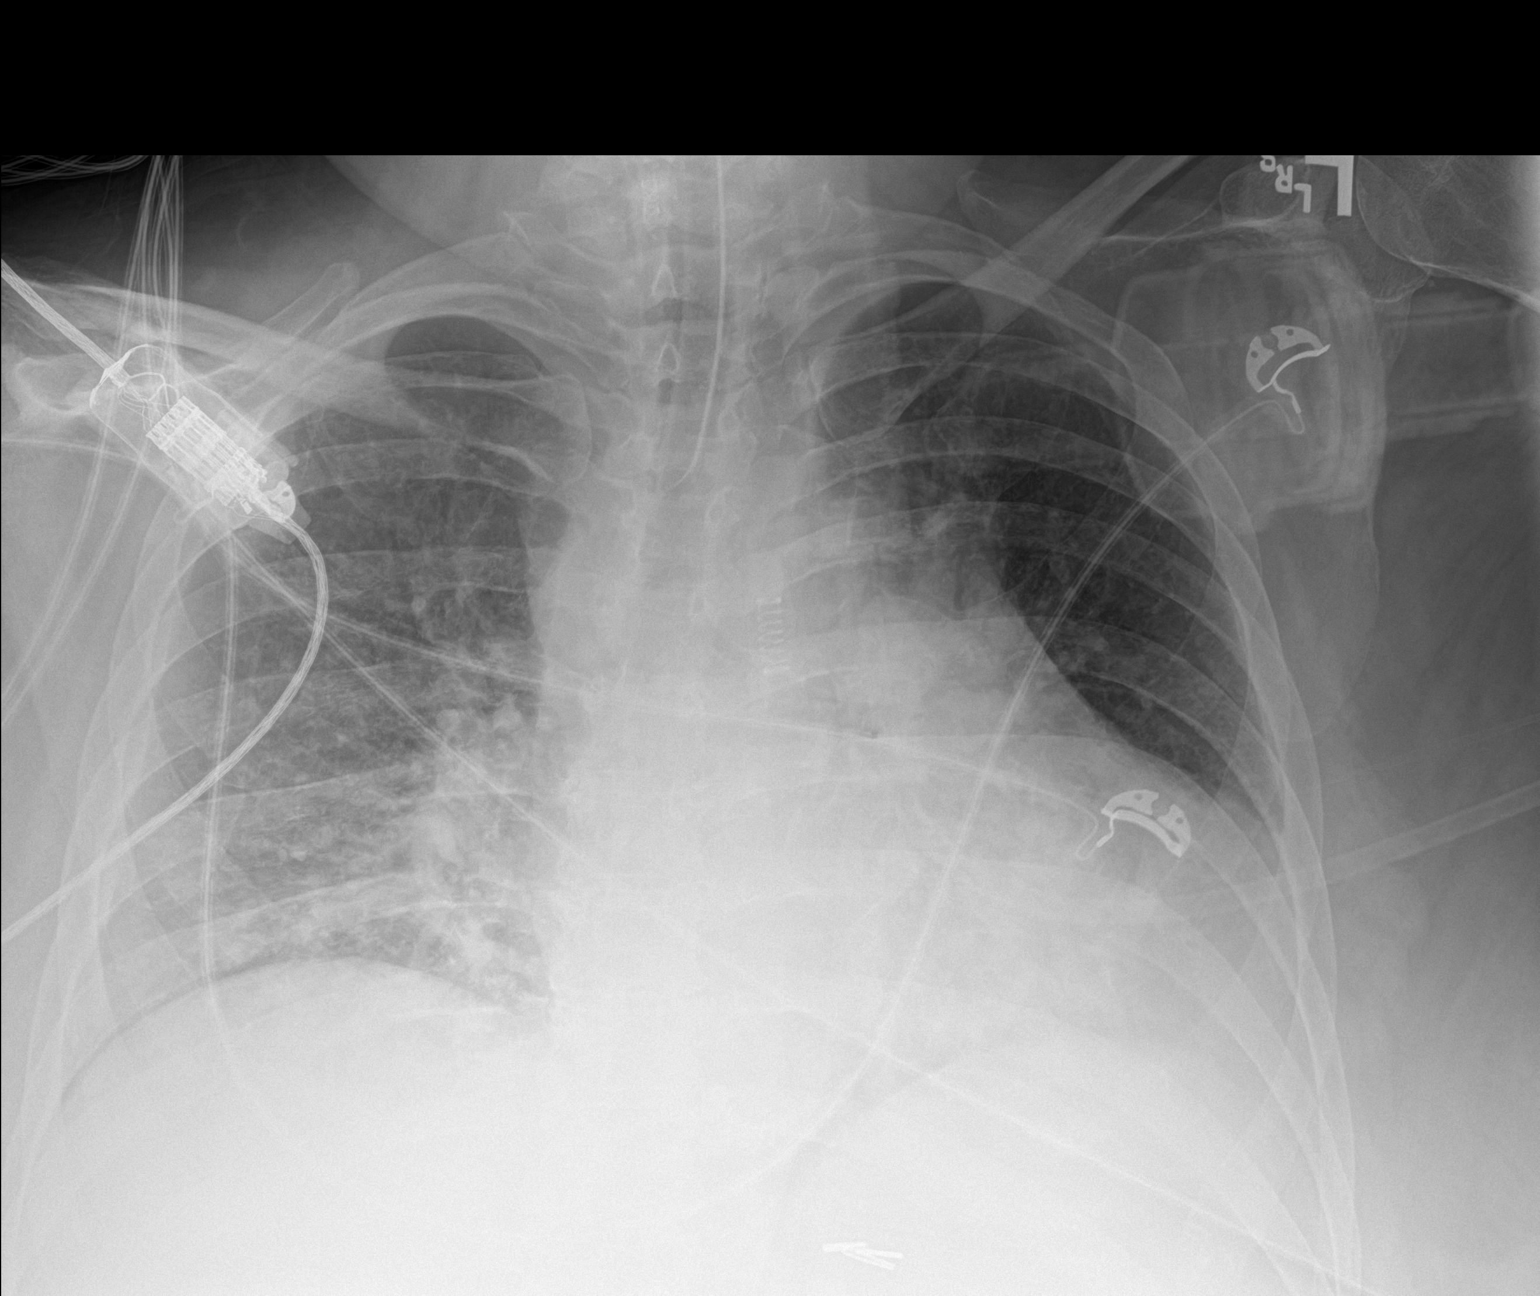

[1 of 1 positions shown; findings below may reference images not displayed]

FINDINGS: Endotracheal tube tip is 4.4 cm above the carina. No pneumothorax.
There is bibasilar atelectatic change with small left pleural
effusion. There is stable cardiomegaly with pulmonary vascularity
within normal limits. No adenopathy. No bone lesions.
IMPRESSION: Endotracheal tube as described without pneumothorax. Bibasilar
atelectasis with small left pleural effusion. Lungs elsewhere clear.
Stable cardiac prominence.

## 2018-08-01 NOTE — Therapy (Signed)
[facility] [address] [place], [name], [address] Phone: [phone]   Fax:  [phone]  Physical Therapy Evaluation  Patient Details  Name: [name] MRN: [id number] Date of Birth: Jan 30, 1961 Referring Provider (PT): [name]   Encounter Date: 08/01/2018  PT End of Session - 08/01/18 1053    Visit Number  1    Number of Visits  12    Date for PT Re-Evaluation  09/12/18    PT Start Time  1012    PT Stop Time  1100    PT Time Calculation (min)  48 min    Activity Tolerance  Patient tolerated treatment well    Behavior During Therapy  [facility] for tasks assessed/performed       Past Medical History:  Diagnosis Date  . Abnormal SPEP 04/17/2014  . Acute left ankle pain 01/26/2017  . ANEMIA-UNSPECIFIED 09/18/2009  . Anxiety   . Arthritis   . Chronic diastolic heart failure, NYHA class 2 (HCC)    Normal LVEDP by May 2018  . COPD (chronic obstructive pulmonary disease) ([place])   . Depression   . DIABETES MELLITUS, TYPE II 08/21/2006  . Diabetic osteomyelitis ([place]) 05/29/2015  . Fracture of 5th metatarsal    non union  . GERD 08/21/2006  . GOUT 08/20/2010  . Hx of umbilical hernia repair   . HYPERLIPIDEMIA 08/21/2006  . HYPERTENSION 08/21/2006  . Infection of wound due to methicillin resistant Staphylococcus aureus (MRSA)   . Internal hemorrhoids   . Multiple allergies 10/14/2016  . OBESITY 06/04/2009  . Onychomycosis 10/27/2015  . Osteomyelitis of left foot ([place]) 05/29/2015  . Pulmonary sarcoidosis ([place])    Followed locally by pulmonology, but also by Dr. Casper Harrison at [facility]  . Right knee pain 01/26/2017  . Vocal cord dysfunction   . Wears partial dentures     Past Surgical History:  Procedure Laterality Date  . ABDOMINAL HYSTERECTOMY    . APPENDECTOMY    . BLADDER SUSPENSION  11/11/2011   Procedure: TRANSVAGINAL TAPE (TVT) PROCEDURE;  Surgeon: Olga Millers, MD;  Location: [facility];  Service:  Gynecology;  Laterality: N/A;  . CAROTIDS  02/18/11   CAROTID DUPLEX; VERTEBRALS ARE PATENT WITH ANTEGRADE FLOW. ICA/CCA RATIO 1.61 ON RIGHT AND 0.75 ON LEFT  . CHOLECYSTECTOMY  1984  . CYSTOSCOPY  11/11/2011   Procedure: CYSTOSCOPY;  Surgeon: Olga Millers, MD;  Location: [facility];  Service: Gynecology;  Laterality: N/A;  . EXTUBATION (ENDOTRACHEAL) IN OR N/A 09/23/2017   Procedure: EXTUBATION (ENDOTRACHEAL) IN OR;  Surgeon: Helayne Seminole, MD;  Location: [facility];  Service: ENT;  Laterality: N/A;  . FIBEROPTIC LARYNGOSCOPY AND TRACHEOSCOPY N/A 09/23/2017   Procedure: FLEXIBLE FIBEROPTIC LARYNGOSCOPY;  Surgeon: Helayne Seminole, MD;  Location: [facility];  Service: ENT;  Laterality: N/A;  . FRACTURE SURGERY     foot  . HERNIA REPAIR    . I&D EXTREMITY Left 06/27/2015   Procedure: Partial Excision Left Calcaneus, Place Antibiotic Beads, and Wound VAC;  Surgeon: Newt Minion, MD;  Location: [facility];  Service: Orthopedics;  Laterality: Left;  . KNEE ARTHROSCOPY     right  . LEFT AND RIGHT HEART CATHETERIZATION WITH CORONARY ANGIOGRAM N/A 04/23/2013   Procedure: LEFT AND RIGHT HEART CATHETERIZATION WITH CORONARY ANGIOGRAM;  Surgeon: Leonie Man, MD;  Location: [facility];  Service: Cardiovascular;  Laterality: N/A;  . LEFT HEART CATH AND CORONARY ANGIOGRAPHY N/A 03/11/2017   Procedure: Left  Heart Cath and Coronary Angiography;  Surgeon: Sherren Mocha, MD;  Location: [facility]; angiographically minimal CAD in the LAD otherwise normal.;  Normal LVEDP.  FALSE POSITIVE MYOVIEW  . LEFT HEART CATH AND CORONARY ANGIOGRAPHY  07/20/2010   LVEF 50-55% WITH VERY MILD GLOBAL HYPOKINESIA; ESSENTIALLY NORMAL CORONARY ARTERIES; NORMAL LV FUNCTION  . METATARSAL OSTEOTOMY WITH OPEN REDUCTION INTERNAL FIXATION (ORIF) METATARSAL WITH FUSION Left 04/09/2014   Procedure: LEFT FOOT FRACTURE OPEN TREATMENT METATARSAL INCLUDES INTERNAL FIXATION EACH;  Surgeon: Lorn Junes, MD;  Location: [facility];  Service: Orthopedics;  Laterality: Left;  . NISSEN FUNDOPLICATION  4854  . NM MYOVIEW LTD  03/09/2013   Lexiscan --> EF 50%; NORMAL MYOCARDIAL PERFUSION STUDY - breast attenuation  . NM MYOVIEW LTD  03/10/2017   : Moderate size "stress-induced "perfusion defect at the apex as well as "ill-defined stress-induced perfusion defect in the lateral wall.  EF 55%.  INTERMEDIATE risk. -->  FALSE POSITIVE  . Right and left CARDIAC CATHETERIZATION  04/23/2013   Angiographic normal coronaries; LVEDP 20 mmHg, PCWP 12-14 mmHg, RAP 12 mmHg.; Fick CO/CI 4.9/2.2  . TOTAL KNEE ARTHROPLASTY Right 06/29/2017   Procedure: RIGHT TOTAL KNEE ARTHROPLASTY;  Surgeon: Newt Minion, MD;  Location: [facility];  Service: Orthopedics;  Laterality: Right;  . TOTAL KNEE ARTHROPLASTY Left 12/07/2017  . TOTAL KNEE ARTHROPLASTY Left 12/07/2017   Procedure: LEFT TOTAL KNEE ARTHROPLASTY;  Surgeon: Newt Minion, MD;  Location: [facility];  Service: Orthopedics;  Laterality: Left;  . TRACHEOSTOMY TUBE PLACEMENT N/A 09/20/2017   Procedure: AWAKE INTUBATION WITH ANESTHESIA WITH VIDEO ASSISTANCE;  Surgeon: Helayne Seminole, MD;  Location: [facility];  Service: ENT;  Laterality: N/A;  . TRANSTHORACIC ECHOCARDIOGRAM  08/2014   Normal LV size and function.  Mild LVH.  EF 55-60%.  Normal regional wall motion.  GR 1 DD.  Normal RV size and function .  [name] TUBAL LIGATION     with reversal in 1994  . VENTRAL HERNIA REPAIR      There were no vitals filed for this visit.   Subjective Assessment - 08/01/18 1013    Subjective  [name] a 57 y/o female who presents to OPPT for Rt sided neck pain x 2-3 weeks with unknown injury.  Pt has tried various medications with minimal relief.  Pt recently saw neurologist, who prescribed muscle relaxer  and she feels it's "helped a little."      Limitations  House hold activities    Patient Stated Goals  improve pain    Currently in Pain?  Yes    Pain Score  4    up to 10/10; at best 4/10   Pain  Location  Neck    Pain Orientation  Right    Pain Descriptors / Indicators  Sharp;Aching    Pain Type  Acute pain    Pain Onset  1 to 4 weeks ago    Pain Frequency  Constant    Aggravating Factors   nothing    Pain Relieving Factors  muscle relaxer         [facility] PT Assessment - 08/01/18 1016      Assessment   Medical Diagnosis  M54.2 (ICD-10-CM) - Neck pain on right side    Referring Provider (PT)  [place], STEPHEN O    Onset Date/Surgical Date  07/14/18    Hand Dominance  Right    Next MD Visit  08/23/18    Prior Therapy  following bil TKA      Precautions   Precautions  None      Restrictions   Weight Bearing Restrictions  No      Balance Screen   Has the patient fallen in the past 6 months  No    Has the patient had a decrease in activity level because of a fear of falling?   No    Is the patient reluctant to leave their home because of a fear of falling?   No      Home Film/video editor residence    Living Arrangements  Spouse/significant other      Prior Function   Level of Independence  Independent    Vocation  On disability    Leisure  shopping, bowling, swimming, going to movies, vacations; goes 3 days/wk to fitness (treadmill, bike, weight machines)      Cognition   Overall Cognitive Status  Within Functional Limits for tasks assessed      Posture/Postural Control   Posture/Postural Control  Postural limitations    Postural Limitations  Rounded Shoulders;Forward head      ROM / Strength   AROM / PROM / Strength  AROM;Strength      AROM   Overall AROM Comments  pain with flexion, extension, bil rotation    AROM Assessment Site  Cervical    Cervical Flexion  33    Cervical Extension  17    Cervical - Right Side Bend  37    Cervical - Left Side Bend  26   pulling on Rt side   Cervical - Right Rotation  42    Cervical - Left Rotation  44      Strength   Overall Strength Comments  pain with flexion    Strength Assessment Site   Shoulder;Elbow    Right/Left Shoulder  Right;Left    Right Shoulder Flexion  5/5    Right Shoulder ABduction  5/5    Right Shoulder Internal Rotation  5/5    Right Shoulder External Rotation  4/5    Left Shoulder Flexion  5/5    Left Shoulder ABduction  5/5    Left Shoulder Internal Rotation  5/5    Left Shoulder External Rotation  5/5    Right/Left Elbow  Right;Left    Right Elbow Flexion  5/5    Right Elbow Extension  5/5    Left Elbow Flexion  5/5    Left Elbow Extension  5/5      Palpation   Palpation comment  trigger points in Rt suboccipitals, cervical paraspinals, upper trap, levator and rhomboids      Special Tests    Special Tests  Cervical    Cervical Tests  Spurling's;Dictraction      Spurling's   Findings  Negative      Distraction Test   Findngs  Negative    Comment  increased pain                Objective measurements completed on examination: See above findings.      [facility] Adult PT Treatment/Exercise - 08/01/18 1016      Modalities   Modalities  Electrical Stimulation;Moist Heat      Moist Heat Therapy   Number Minutes Moist Heat  15 Minutes    Moist Heat Location  Cervical;Shoulder   Rt     Electrical Stimulation   Electrical Stimulation Location  Rt neck/shoulder    Electrical Stimulation  Action  IFC    Electrical Stimulation Parameters  to tolerance x 15 min    Electrical Stimulation Goals  Pain;Tone      Manual Therapy   Manual Therapy  Soft tissue mobilization    Soft tissue mobilization  gentle STM and cervical traction to suboccipitals, cervical paraspinals and upper trap             PT Education - 08/01/18 1053    Education Details  HEP    Person(s) Educated  Patient    Methods  Explanation;Demonstration;Handout    Comprehension  Verbalized understanding;Returned demonstration;Need further instruction          PT Long Term Goals - 08/01/18 1058      PT LONG TERM GOAL #1   Title  independent with HEP     Status  New    Target Date  09/12/18      PT LONG TERM GOAL #2   Title  improve cervical ROM to [facility] without increase in pain    Status  New    Target Date  09/12/18      PT LONG TERM GOAL #3   Title  report ability to perform upper body dressing without increase in pain for improved function    Status  New    Target Date  09/12/18             Plan - 08/01/18 1055    Clinical Impression Statement  [name] a 57 y/o female who presents to OPPT with sudden onset of Rt sided neck pain x 2-3 weeks.  Pt demonstrates decreased ROM with pain with movement, active trigger points and poor posture.  Pt will benefit from PT to address deficits listed.    History and Personal Factors relevant to plan of care:  sarcoidosis, anxiety, arthritis, CHF, COPD, DM, depression, HTN, obesity    Clinical Presentation  Evolving    Clinical Presentation due to:  unknown MOI with minimal relief in pain    Clinical Decision Making  Moderate    Rehab Potential  Good    PT Frequency  2x / week    PT Duration  6 weeks    PT Treatment/Interventions  ADLs/Self Care Home Management;Cryotherapy;Electrical Stimulation;Iontophoresis 4mg /ml Dexamethasone;Moist Heat;Traction;Therapeutic exercise;Therapeutic activities;Functional mobility training;Ultrasound;Patient/family education;Manual techniques;Taping;Dry needling    PT Next Visit Plan  review HEP, DN/manual/modalities PRN, continue postural strengthening/stretching    PT Home Exercise Plan  Access Code: [email or web address]     Consulted and Agree with Plan of Care  Patient       Patient will benefit from skilled therapeutic intervention in order to improve the following deficits and impairments:  Hypomobility, Increased fascial restricitons, Increased muscle spasms, Decreased strength, Decreased range of motion, Postural dysfunction, Pain  Visit Diagnosis: Cervicalgia - Plan: PT plan of care cert/re-cert  Cramp and spasm - Plan: PT plan of care cert/re-cert  Abnormal  posture - Plan: PT plan of care cert/re-cert     Problem List Patient Active Problem List   Diagnosis Date Noted  . Subcortical microvascular ischemic occlusive disease 07/12/2018  . Rapid palpitations 07/12/2018  . Impingement of left ankle joint 06/26/2018  . Constipation 04/21/2018  . Anxiety 04/05/2018  . Total knee replacement status, left 12/07/2017  . Unilateral primary osteoarthritis, left knee 11/07/2017  . Dysphagia 09/20/2017  . Epiglottitis   . Plantar fasciitis, right 07/13/2017  . S/P total knee arthroplasty, right 06/29/2017  . Osteoarthrosis, localized, primary, knee, right   . Sprain of calcaneofibular  ligament of right ankle 06/06/2017  . Osteoarthritis of right knee 01/26/2017  . Multiple allergies 10/14/2016  . Onychomycosis 10/27/2015  . CKD (chronic kidney disease) stage 3, GFR 30-59 ml/min (HCC) 04/27/2015  . MRSA (methicillin resistant staph aureus) culture positive 03/27/2015  . Wound infection complicating hardware ([place]) 03/27/2015  . Fatty liver 09/30/2014  . Hot flashes 07/15/2014  . Abnormal SPEP 04/17/2014  . Fracture of left leg 04/17/2014  . Cushingoid side effect of steroids ([place]) 04/17/2014  . Internal hemorrhoids   . Depression   . Preoperative clearance 03/25/2014  . Obstructive chronic bronchitis without exacerbation ([place]) 09/18/2013  . Chest pain 04/11/2013  . Hypertensive heart disease with chronic diastolic congestive heart failure ([place])   . Solitary pulmonary nodule, on CT 02/2013 - stable over 2 years in 2015 02/20/2013  . Gout 08/20/2010  . Anemia 09/18/2009  . Morbid obesity ([place]) 06/04/2009  . Sleep apnea 04/21/2009  . Sarcoidosis of lung ([place]) 04/10/2007  . Hyperlipidemia 08/21/2006  . Essential hypertension 08/21/2006  . GERD 08/21/2006  . Type II diabetes mellitus with renal manifestations ([place]) 08/21/2006      Laureen Abrahams, PT, DPT 08/01/18 11:03 AM    [facility] [place], [name], [address] Phone: [phone]   Fax:  [phone]  Name: [name] MRN: [id number] Date of Birth: [birth year]

## 2018-08-01 NOTE — Patient Instructions (Signed)
Access Code: [email or web address]  URL: [email or web address]  Date: 08/01/2018  Prepared by: Faustino Congress   Exercises  Seated Upper Trapezius Stretch - 3 reps - 1 sets - 30 sec hold - 2x daily - 7x weekly  Seated Levator Scapulae Stretch - 1 reps - 1 sets - 30 sec hold - 2x daily - 7x weekly  Standing Backward Shoulder Rolls - 10 reps - 1 sets - 2x daily - 7x weekly  Seated Scapular Retraction - 10 reps - 1 sets - 5 sec hold - 2x daily - 7x weekly  Patient Education  Trigger Point Dry Needling

## 2018-08-02 IMAGING — DX DG CHEST 1V PORT
1 series · 1 of 1 positions shown · non-contrast
Comparison: 09/21/2017.

CLINICAL DATA: Respiratory failure.

EXAM:
PORTABLE CHEST 1 VIEW

[chest ap]
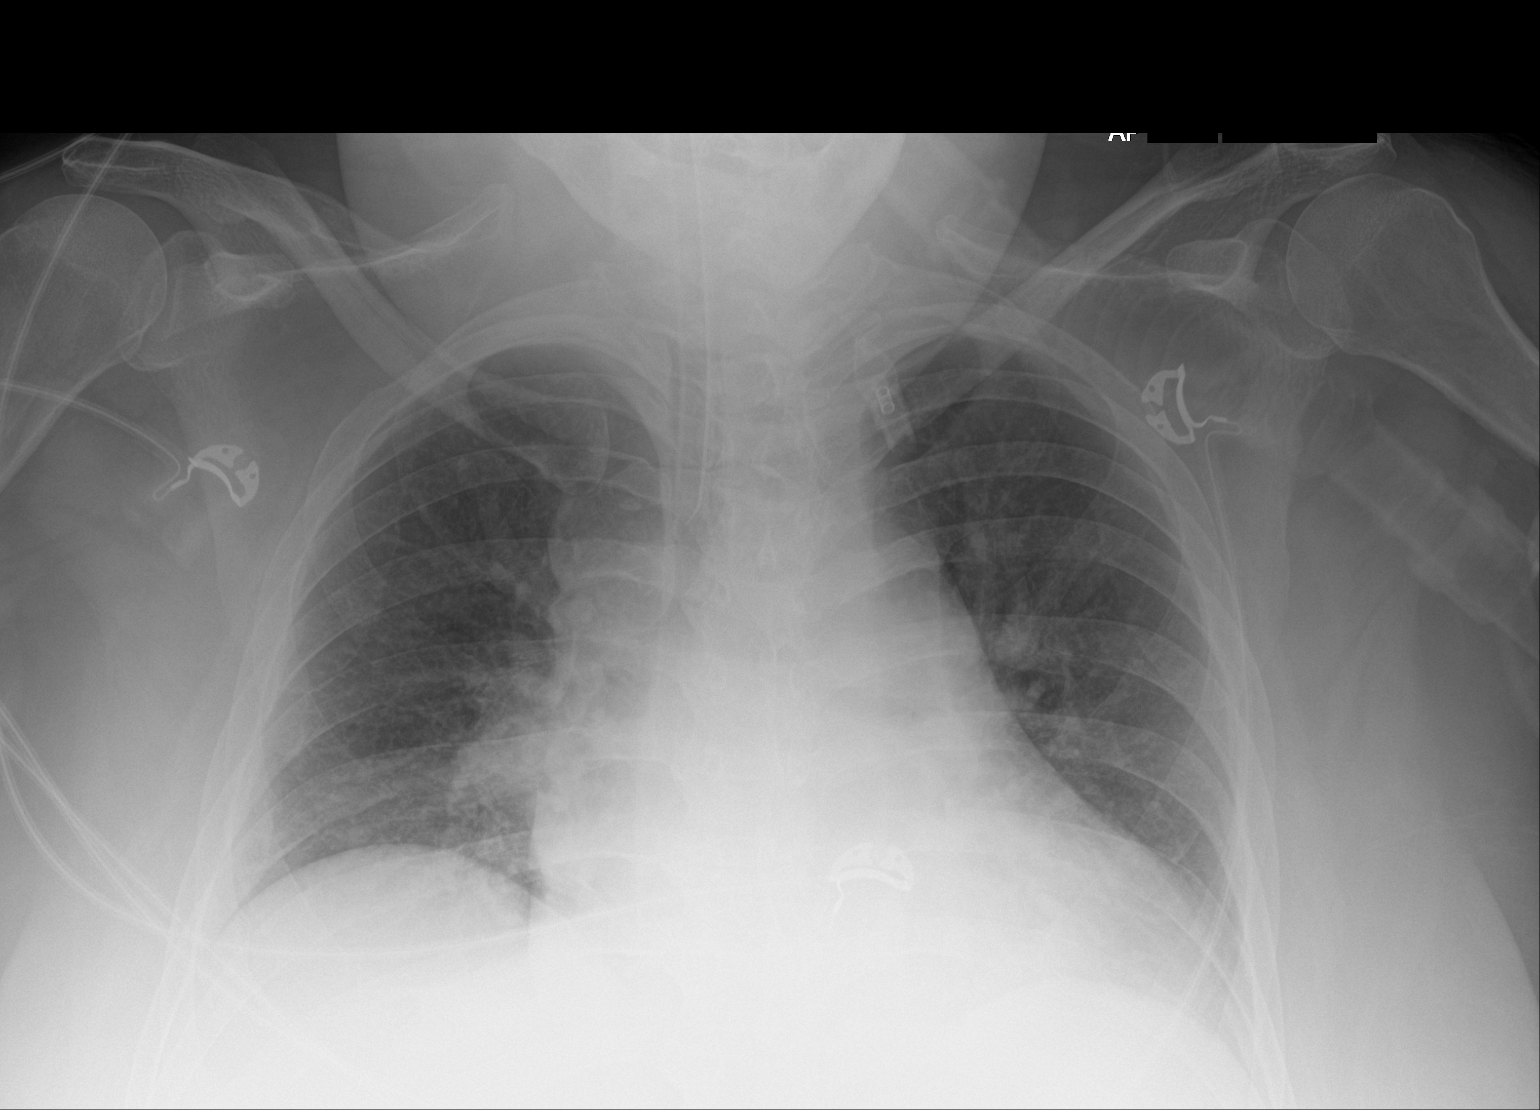

[1 of 1 positions shown; findings below may reference images not displayed]

FINDINGS: Endotracheal tube in stable position. Stable cardiomegaly.
Improvement of bibasilar atelectasis. Interim improvement of small
left pleural effusion. No pneumothorax.
IMPRESSION: 1. Endotracheal tube in stable position.

2. Stable cardiomegaly.

3. Interim improvement of bibasilar atelectasis. Improvement of
small left pleural effusion.

## 2018-08-03 IMAGING — DX DG CHEST 1V PORT
1 series · 1 of 1 positions shown · non-contrast
Comparison: 09/22/2017 .

CLINICAL DATA: Intubation.

EXAM:
PORTABLE CHEST 1 VIEW

[chest ap]
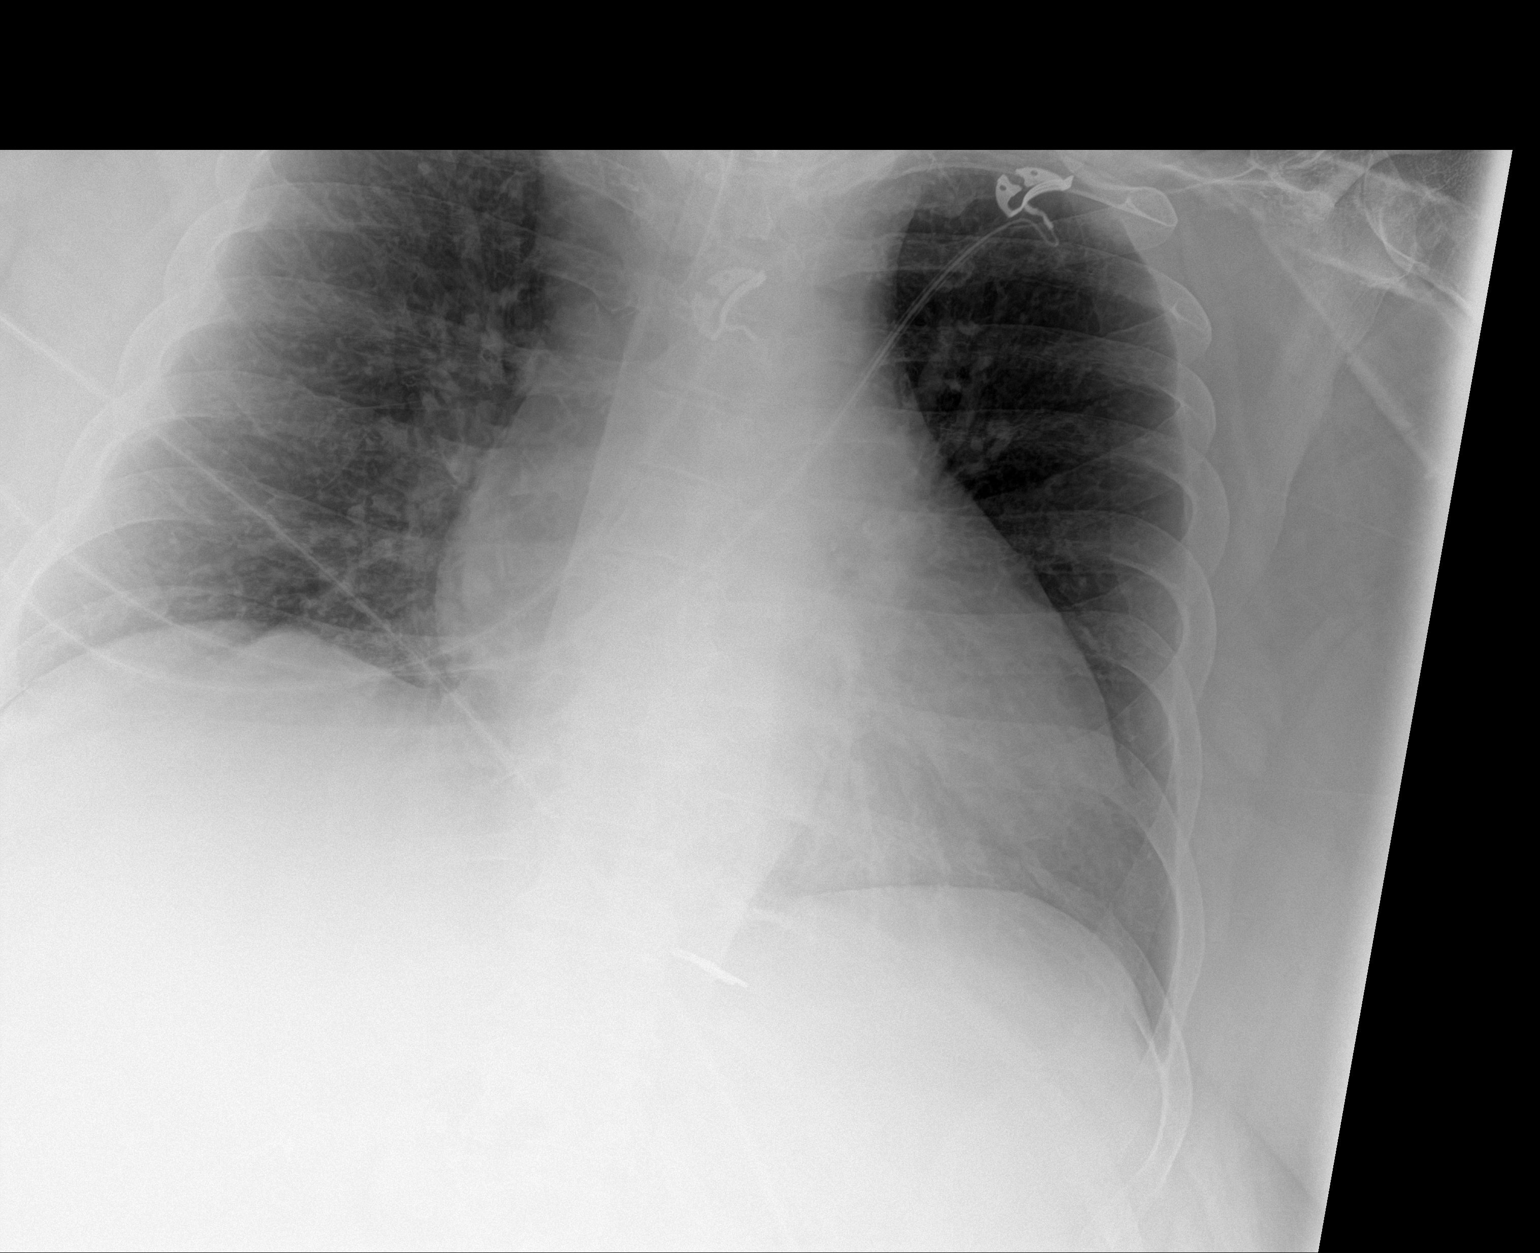

[1 of 1 positions shown; findings below may reference images not displayed]

FINDINGS: Endotracheal tube in stable position. Cardiomegaly. Low lung volumes
. Mild bibasilar subsegmental atelectasis. Tiny left pleural
effusion. These findings have improved from prior exam. No
pneumothorax.
IMPRESSION: 1. Endotracheal tube in stable position.

2. Low lung volumes with mild basilar atelectasis. Small left
pleural effusion. These findings have improved from prior exam .

3. Stable cardiomegaly.

## 2018-08-04 IMAGING — DX DG CHEST 1V PORT
1 series · 1 of 1 positions shown · non-contrast
Comparison: 09/23/2017

CLINICAL DATA: Respiratory failure

EXAM:
PORTABLE CHEST 1 VIEW

[chest ap]
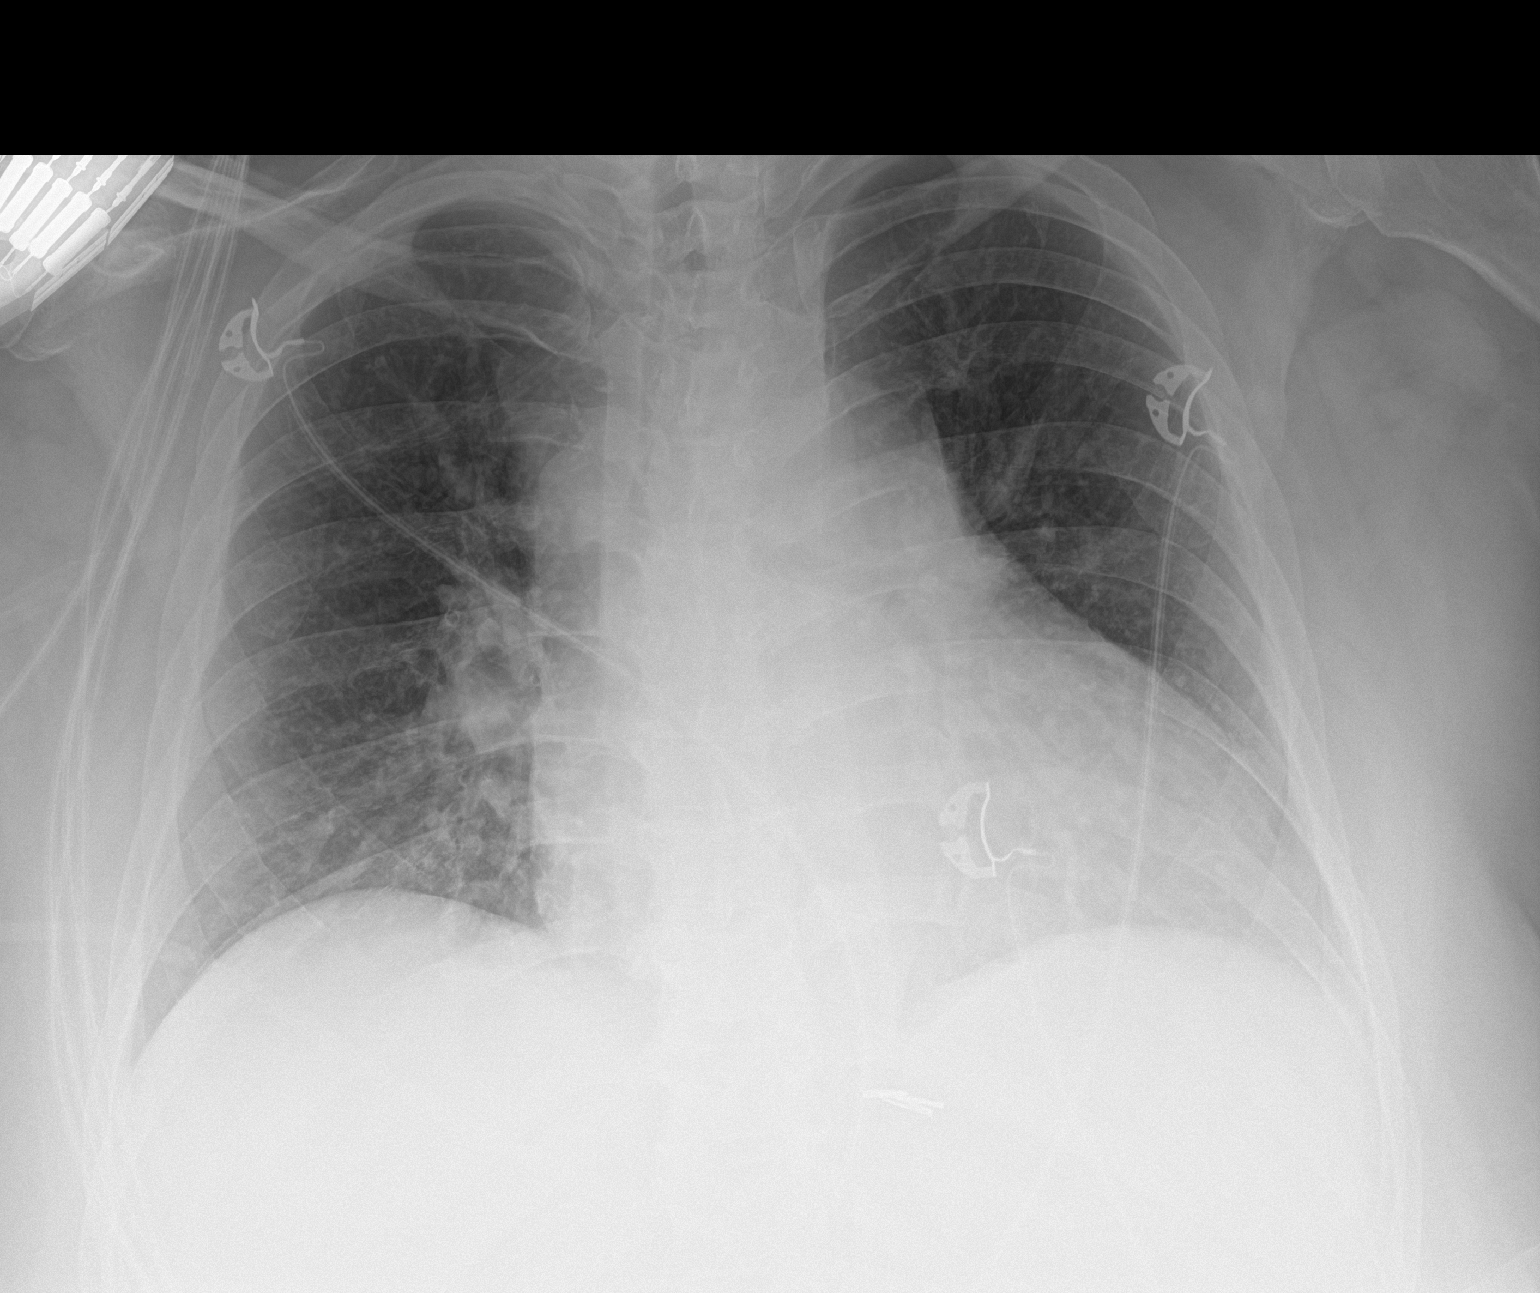

[1 of 1 positions shown; findings below may reference images not displayed]

FINDINGS: Cardiac shadow is mildly prominent accentuated by the poor
technique. The lungs are well aerated bilaterally. No focal
infiltrate or sizable effusion is seen. Endotracheal tube has been
removed in the interval. No bony abnormality is noted.
IMPRESSION: No active disease.

## 2018-08-07 ENCOUNTER — Encounter: Payer: BLUE CROSS/BLUE SHIELD | Admitting: Physical Therapy

## 2018-08-08 ENCOUNTER — Telehealth: Payer: Self-pay | Admitting: Podiatry

## 2018-08-08 ENCOUNTER — Other Ambulatory Visit: Payer: Self-pay

## 2018-08-08 MED ORDER — ATORVASTATIN CALCIUM 40 MG PO TABS: ORAL_TABLET | ORAL | 3 refills | Status: DC

## 2018-08-08 NOTE — Telephone Encounter (Signed)
I received a summons for [employer] on Dec 16th, I'm having surgery on Dec. 13th. I need to get a note excusing me from [employer].

## 2018-08-09 ENCOUNTER — Encounter: Payer: Self-pay | Admitting: Physical Therapy

## 2018-08-09 ENCOUNTER — Ambulatory Visit (INDEPENDENT_AMBULATORY_CARE_PROVIDER_SITE_OTHER): Payer: BLUE CROSS/BLUE SHIELD | Admitting: Physical Therapy

## 2018-08-09 DIAGNOSIS — R293 Abnormal posture: Secondary | ICD-10-CM

## 2018-08-09 DIAGNOSIS — R252 Cramp and spasm: Secondary | ICD-10-CM

## 2018-08-09 DIAGNOSIS — M542 Cervicalgia: Secondary | ICD-10-CM | POA: Diagnosis not present

## 2018-08-09 NOTE — Therapy (Signed)
[facility] [address] [place], [name], [address] Phone: [phone]   Fax:  [phone]  Physical Therapy Treatment  Patient Details  Name: [name] MRN: [id number] Date of Birth: 05-03-1961 Referring Provider (PT): [name]   Encounter Date: 08/09/2018  PT End of Session - 08/09/18 1021    Visit Number  2    Number of Visits  12    Date for PT Re-Evaluation  09/12/18    PT Start Time  1018    PT Stop Time  1057    PT Time Calculation (min)  39 min    Activity Tolerance  Patient tolerated treatment well    Behavior During Therapy  [facility] for tasks assessed/performed       Past Medical History:  Diagnosis Date  . Abnormal SPEP 04/17/2014  . Acute left ankle pain 01/26/2017  . ANEMIA-UNSPECIFIED 09/18/2009  . Anxiety   . Arthritis   . Chronic diastolic heart failure, NYHA class 2 (HCC)    Normal LVEDP by May 2018  . COPD (chronic obstructive pulmonary disease) ([place])   . Depression   . DIABETES MELLITUS, TYPE II 08/21/2006  . Diabetic osteomyelitis ([place]) 05/29/2015  . Fracture of 5th metatarsal    non union  . GERD 08/21/2006  . GOUT 08/20/2010  . Hx of umbilical hernia repair   . HYPERLIPIDEMIA 08/21/2006  . HYPERTENSION 08/21/2006  . Infection of wound due to methicillin resistant Staphylococcus aureus (MRSA)   . Internal hemorrhoids   . Multiple allergies 10/14/2016  . OBESITY 06/04/2009  . Onychomycosis 10/27/2015  . Osteomyelitis of left foot ([place]) 05/29/2015  . Pulmonary sarcoidosis ([place])    Followed locally by pulmonology, but also by Dr. Casper Harrison at [facility]  . Right knee pain 01/26/2017  . Vocal cord dysfunction   . Wears partial dentures     Past Surgical History:  Procedure Laterality Date  . ABDOMINAL HYSTERECTOMY    . APPENDECTOMY    . BLADDER SUSPENSION  11/11/2011   Procedure: TRANSVAGINAL TAPE (TVT) PROCEDURE;  Surgeon: Olga Millers, MD;  Location: [facility];  Service:  Gynecology;  Laterality: N/A;  . CAROTIDS  02/18/11   CAROTID DUPLEX; VERTEBRALS ARE PATENT WITH ANTEGRADE FLOW. ICA/CCA RATIO 1.61 ON RIGHT AND 0.75 ON LEFT  . CHOLECYSTECTOMY  1984  . CYSTOSCOPY  11/11/2011   Procedure: CYSTOSCOPY;  Surgeon: Olga Millers, MD;  Location: [facility];  Service: Gynecology;  Laterality: N/A;  . EXTUBATION (ENDOTRACHEAL) IN OR N/A 09/23/2017   Procedure: EXTUBATION (ENDOTRACHEAL) IN OR;  Surgeon: Helayne Seminole, MD;  Location: [facility];  Service: ENT;  Laterality: N/A;  . FIBEROPTIC LARYNGOSCOPY AND TRACHEOSCOPY N/A 09/23/2017   Procedure: FLEXIBLE FIBEROPTIC LARYNGOSCOPY;  Surgeon: Helayne Seminole, MD;  Location: [facility];  Service: ENT;  Laterality: N/A;  . FRACTURE SURGERY     foot  . HERNIA REPAIR    . I&D EXTREMITY Left 06/27/2015   Procedure: Partial Excision Left Calcaneus, Place Antibiotic Beads, and Wound VAC;  Surgeon: Newt Minion, MD;  Location: [facility];  Service: Orthopedics;  Laterality: Left;  . KNEE ARTHROSCOPY     right  . LEFT AND RIGHT HEART CATHETERIZATION WITH CORONARY ANGIOGRAM N/A 04/23/2013   Procedure: LEFT AND RIGHT HEART CATHETERIZATION WITH CORONARY ANGIOGRAM;  Surgeon: Leonie Man, MD;  Location: [facility];  Service: Cardiovascular;  Laterality: N/A;  . LEFT HEART CATH AND CORONARY ANGIOGRAPHY N/A 03/11/2017   Procedure: Left  Heart Cath and Coronary Angiography;  Surgeon: Sherren Mocha, MD;  Location: [facility]; angiographically minimal CAD in the LAD otherwise normal.;  Normal LVEDP.  FALSE POSITIVE MYOVIEW  . LEFT HEART CATH AND CORONARY ANGIOGRAPHY  07/20/2010   LVEF 50-55% WITH VERY MILD GLOBAL HYPOKINESIA; ESSENTIALLY NORMAL CORONARY ARTERIES; NORMAL LV FUNCTION  . METATARSAL OSTEOTOMY WITH OPEN REDUCTION INTERNAL FIXATION (ORIF) METATARSAL WITH FUSION Left 04/09/2014   Procedure: LEFT FOOT FRACTURE OPEN TREATMENT METATARSAL INCLUDES INTERNAL FIXATION EACH;  Surgeon: Lorn Junes, MD;  Location: [facility];  Service: Orthopedics;  Laterality: Left;  . NISSEN FUNDOPLICATION  9470  . NM MYOVIEW LTD  03/09/2013   Lexiscan --> EF 50%; NORMAL MYOCARDIAL PERFUSION STUDY - breast attenuation  . NM MYOVIEW LTD  03/10/2017   : Moderate size "stress-induced "perfusion defect at the apex as well as "ill-defined stress-induced perfusion defect in the lateral wall.  EF 55%.  INTERMEDIATE risk. -->  FALSE POSITIVE  . Right and left CARDIAC CATHETERIZATION  04/23/2013   Angiographic normal coronaries; LVEDP 20 mmHg, PCWP 12-14 mmHg, RAP 12 mmHg.; Fick CO/CI 4.9/2.2  . TOTAL KNEE ARTHROPLASTY Right 06/29/2017   Procedure: RIGHT TOTAL KNEE ARTHROPLASTY;  Surgeon: Newt Minion, MD;  Location: [facility];  Service: Orthopedics;  Laterality: Right;  . TOTAL KNEE ARTHROPLASTY Left 12/07/2017  . TOTAL KNEE ARTHROPLASTY Left 12/07/2017   Procedure: LEFT TOTAL KNEE ARTHROPLASTY;  Surgeon: Newt Minion, MD;  Location: [facility];  Service: Orthopedics;  Laterality: Left;  . TRACHEOSTOMY TUBE PLACEMENT N/A 09/20/2017   Procedure: AWAKE INTUBATION WITH ANESTHESIA WITH VIDEO ASSISTANCE;  Surgeon: Helayne Seminole, MD;  Location: [facility];  Service: ENT;  Laterality: N/A;  . TRANSTHORACIC ECHOCARDIOGRAM  08/2014   Normal LV size and function.  Mild LVH.  EF 55-60%.  Normal regional wall motion.  GR 1 DD.  Normal RV size and function .  [name] TUBAL LIGATION     with reversal in 1994  . VENTRAL HERNIA REPAIR      There were no vitals filed for this visit.  Subjective Assessment - 08/09/18 1020    Subjective  Pt wearing heart monitor, for another 2 weeks. She states improving pain.     Patient Stated Goals  improve pain    Currently in Pain?  Yes    Pain Score  3     Pain Location  Neck    Pain Orientation  Right    Pain Descriptors / Indicators  Aching    Pain Type  Acute pain    Pain Onset  1 to 4 weeks ago    Pain Frequency  Intermittent                       [facility] Adult PT  Treatment/Exercise - 08/09/18 1021      Posture/Postural Control   Posture/Postural Control  --    Postural Limitations  --      Exercises   Exercises  Neck      Neck Exercises: Theraband   Rows  20 reps;Red      Neck Exercises: Seated   Neck Retraction  20 reps    Cervical Rotation  10 reps    Shoulder Rolls  20 reps    Other Seated Exercise  Scap squeeze x20;       Modalities   Modalities  --      Moist Heat Therapy   Moist Heat Location  --  Acupuncturist Location  Rt neck/shoulder    Electrical Stimulation Goals  --      Manual Therapy   Manual Therapy  Soft tissue mobilization    Soft tissue mobilization  gentle STM and cervical traction to suboccipitals, cervical paraspinals and upper trap      Neck Exercises: Stretches   Upper Trapezius Stretch  30 seconds;3 reps;Right;Left    Levator Stretch  3 reps;30 seconds;Right;Left                  PT Long Term Goals - 08/01/18 1058      PT LONG TERM GOAL #1   Title  independent with HEP    Status  New    Target Date  09/12/18      PT LONG TERM GOAL #2   Title  improve cervical ROM to [facility] without increase in pain    Status  New    Target Date  09/12/18      PT LONG TERM GOAL #3   Title  report ability to perform upper body dressing without increase in pain for improved function    Status  New    Target Date  09/12/18            Plan - 08/09/18 1059    Clinical Impression Statement  Pt with tenderness and tigtness in R sided cervical musculature. Addressed with ther ex and manual today. Added cervical retraction to HEP. Pt states improved soreness after session today.     Rehab Potential  Good    PT Frequency  2x / week    PT Duration  6 weeks    PT Treatment/Interventions  ADLs/Self Care Home Management;Cryotherapy;Electrical Stimulation;Iontophoresis 4mg /ml Dexamethasone;Moist Heat;Traction;Therapeutic exercise;Therapeutic activities;Functional mobility  training;Ultrasound;Patient/family education;Manual techniques;Taping;Dry needling    PT Next Visit Plan  review HEP, DN/manual/modalities PRN, continue postural strengthening/stretching    PT Home Exercise Plan  Access Code: [email or web address]     Consulted and Agree with Plan of Care  Patient       Patient will benefit from skilled therapeutic intervention in order to improve the following deficits and impairments:  Hypomobility, Increased fascial restricitons, Increased muscle spasms, Decreased strength, Decreased range of motion, Postural dysfunction, Pain  Visit Diagnosis: Cervicalgia  Cramp and spasm  Abnormal posture     Problem List Patient Active Problem List   Diagnosis Date Noted  . Subcortical microvascular ischemic occlusive disease 07/12/2018  . Rapid palpitations 07/12/2018  . Impingement of left ankle joint 06/26/2018  . Constipation 04/21/2018  . Anxiety 04/05/2018  . Total knee replacement status, left 12/07/2017  . Unilateral primary osteoarthritis, left knee 11/07/2017  . Dysphagia 09/20/2017  . Epiglottitis   . Plantar fasciitis, right 07/13/2017  . S/P total knee arthroplasty, right 06/29/2017  . Osteoarthrosis, localized, primary, knee, right   . Sprain of calcaneofibular ligament of right ankle 06/06/2017  . Osteoarthritis of right knee 01/26/2017  . Multiple allergies 10/14/2016  . Onychomycosis 10/27/2015  . CKD (chronic kidney disease) stage 3, GFR 30-59 ml/min (HCC) 04/27/2015  . MRSA (methicillin resistant staph aureus) culture positive 03/27/2015  . Wound infection complicating hardware ([place]) 03/27/2015  . Fatty liver 09/30/2014  . Hot flashes 07/15/2014  . Abnormal SPEP 04/17/2014  . Fracture of left leg 04/17/2014  . Cushingoid side effect of steroids ([place]) 04/17/2014  . Internal hemorrhoids   . Depression   . Preoperative clearance 03/25/2014  . Obstructive chronic bronchitis without exacerbation ([place]) 09/18/2013  . Chest  pain 04/11/2013  .  Hypertensive heart disease with chronic diastolic congestive heart failure ([place])   . Solitary pulmonary nodule, on CT 02/2013 - stable over 2 years in 2015 02/20/2013  . Gout 08/20/2010  . Anemia 09/18/2009  . Morbid obesity ([place]) 06/04/2009  . Sleep apnea 04/21/2009  . Sarcoidosis of lung ([place]) 04/10/2007  . Hyperlipidemia 08/21/2006  . Essential hypertension 08/21/2006  . GERD 08/21/2006  . Type II diabetes mellitus with renal manifestations ([place]) 08/21/2006     Lyndee Hensen, PT, DPT 11:00 AM  08/09/18   [facility] [place], [name], [address] Phone: [phone]   Fax:  [phone]  Name: [name] MRN: [id number] Date of Birth: June 11, 1961

## 2018-08-10 ENCOUNTER — Encounter: Payer: Self-pay | Admitting: Family Medicine

## 2018-08-10 ENCOUNTER — Encounter: Payer: Self-pay | Admitting: Physical Therapy

## 2018-08-10 ENCOUNTER — Ambulatory Visit (INDEPENDENT_AMBULATORY_CARE_PROVIDER_SITE_OTHER): Payer: BLUE CROSS/BLUE SHIELD | Admitting: Physical Therapy

## 2018-08-10 ENCOUNTER — Ambulatory Visit (INDEPENDENT_AMBULATORY_CARE_PROVIDER_SITE_OTHER): Payer: BLUE CROSS/BLUE SHIELD | Admitting: Family Medicine

## 2018-08-10 VITALS — BP 120/50 | HR 82 | Temp 98.5°F | Ht 66.0 in | Wt 255.2 lb

## 2018-08-10 DIAGNOSIS — R293 Abnormal posture: Secondary | ICD-10-CM | POA: Diagnosis not present

## 2018-08-10 DIAGNOSIS — Z6841 Body Mass Index (BMI) 40.0 and over, adult: Secondary | ICD-10-CM | POA: Diagnosis not present

## 2018-08-10 DIAGNOSIS — D860 Sarcoidosis of lung: Secondary | ICD-10-CM | POA: Diagnosis not present

## 2018-08-10 DIAGNOSIS — M542 Cervicalgia: Secondary | ICD-10-CM

## 2018-08-10 DIAGNOSIS — J0100 Acute maxillary sinusitis, unspecified: Secondary | ICD-10-CM

## 2018-08-10 DIAGNOSIS — R252 Cramp and spasm: Secondary | ICD-10-CM

## 2018-08-10 MED ORDER — AMOXICILLIN-POT CLAVULANATE 875-125 MG PO TABS: 1.0000 | ORAL_TABLET | Freq: Two times a day (BID) | ORAL | 0 refills | Status: AC

## 2018-08-10 MED ORDER — PREDNISONE 50 MG PO TABS: ORAL_TABLET | ORAL | 0 refills | Status: DC

## 2018-08-10 NOTE — Patient Instructions (Addendum)
Sinsusitis And likely flare of sarcoidosis This is likely viral sinusitis given only 4 days of symptoms.  With that being said patient has a tendency to get sick very quickly.  With her baseline sarcoidosis-we will give her a 5-day boost of prednisone.  If she is not improving within 48 hours she will start the Augmentin which was printed for her.  I would advise her to get plenty of rest during this time.  I normally would recommend good hydration but she has congestive heart failure-should remain on current regimen of liquids.  She knows to see [place] back if symptoms are not improving on above regimen or if they are worsening-particularly see her back for fever or worsening shortness of breath.

## 2018-08-10 NOTE — Progress Notes (Signed)
PCP: [name], MD  Subjective:  TENIA GOH is a 57 y.o. year old very pleasant female patient who presents with sinusitis symptoms including nasal congestion, sinus tenderness but also with some pulmonary symptoms with baseline sarcoidosis- of wheeze and increased shortness of breath.  Has some maxillary sinus tenderness bilaterally.  Getting some yellow discharge from her nostrils -day of illness: 4 -Symptoms show no change -previous treatments: albuterol helping with wheeze and shortness of breath.  Also gets some mild chest tightness which resolves with albuterol -sick contacts/travel/risks: denies flu exposure.   ROS-denies fever,  NVD, tooth pain.  Does have shortness of breath and some chest tightness-both resolved with albuterol.  Also has wheeze which responds to albuterol  Pertinent Past Medical History-  Patient Active Problem List   Diagnosis Date Noted  . Constipation 04/21/2018    Priority: High  . Obstructive chronic bronchitis without exacerbation ([place]) 09/18/2013    Priority: High  . Chest pain 04/11/2013    Priority: High  . Hypertensive heart disease with chronic diastolic congestive heart failure ([place])     Priority: High  . Sarcoidosis of lung ([place]) 04/10/2007    Priority: High  . Type II diabetes mellitus with renal manifestations ([place]) 08/21/2006    Priority: High  . Epiglottitis     Priority: Medium  . Osteoarthritis of right knee 01/26/2017    Priority: Medium  . CKD (chronic kidney disease) stage 3, GFR 30-59 ml/min (HCC) 04/27/2015    Priority: Medium  . Fatty liver 09/30/2014    Priority: Medium  . Depression     Priority: Medium  . Gout 08/20/2010    Priority: Medium  . Anemia 09/18/2009    Priority: Medium  . Sleep apnea 04/21/2009    Priority: Medium  . Hyperlipidemia 08/21/2006    Priority: Medium  . Essential hypertension 08/21/2006    Priority: Medium  . Dysphagia 09/20/2017    Priority: Low  . Plantar fasciitis, right  07/13/2017    Priority: Low  . Osteoarthrosis, localized, primary, knee, right     Priority: Low  . Sprain of calcaneofibular ligament of right ankle 06/06/2017    Priority: Low  . Onychomycosis 10/27/2015    Priority: Low  . MRSA (methicillin resistant staph aureus) culture positive 03/27/2015    Priority: Low  . Hot flashes 07/15/2014    Priority: Low  . Abnormal SPEP 04/17/2014    Priority: Low  . Fracture of left leg 04/17/2014    Priority: Low  . Cushingoid side effect of steroids ([place]) 04/17/2014    Priority: Low  . Internal hemorrhoids     Priority: Low  . Preoperative clearance 03/25/2014    Priority: Low  . Solitary pulmonary nodule, on CT 02/2013 - stable over 2 years in 2015 02/20/2013    Priority: Low  . Morbid obesity ([place]) 06/04/2009    Priority: Low  . GERD 08/21/2006    Priority: Low  . Subcortical microvascular ischemic occlusive disease 07/12/2018  . Rapid palpitations 07/12/2018  . Impingement of left ankle joint 06/26/2018  . Anxiety 04/05/2018  . Total knee replacement status, left 12/07/2017  . Unilateral primary osteoarthritis, left knee 11/07/2017  . S/P total knee arthroplasty, right 06/29/2017  . Multiple allergies 10/14/2016  . Wound infection complicating hardware ([place]) 03/27/2015    Medications- reviewed  Current Outpatient Medications  Medication Sig Dispense Refill  . albuterol (PROVENTIL HFA;VENTOLIN HFA) 108 (90 Base) MCG/ACT inhaler Inhale 1-2 puffs into the lungs every 6 (six)  hours as needed for wheezing or shortness of breath. 8 g 5  . allopurinol (ZYLOPRIM) 100 MG tablet Take 100 mg by mouth 2 (two) times daily.     [name] aspirin EC 81 MG tablet Take 81 mg by mouth daily.     [name] atorvastatin (LIPITOR) 40 MG tablet TAKE 1 TAB BY MOUTH ONCE DAILY 90 tablet 3  . BAYER MICROLET LANCETS lancets Use as instructed to check sugar 3 times daily 300 each 5  . buPROPion (WELLBUTRIN XL) 300 MG 24 hr tablet Take 1 tablet (300 mg total) by mouth daily.  90 tablet 3  . Calcium Citrate 200 MG TABS Take 200 mg by mouth daily.    . carvedilol (COREG) 25 MG tablet TAKE 1 TABLET (25 MG TOTAL) BY MOUTH 2 (TWO) TIMES DAILY WITH A MEAL. 180 tablet 1  . Cholecalciferol (VITAMIN D) 2000 units tablet Take 2,000 Units by mouth daily.    . Cyanocobalamin (VITAMIN B-12 PO) Take 2,500 mcg by mouth daily.     . cyclobenzaprine (FLEXERIL) 5 MG tablet Take 1 tablet at bedtime x 3 days, then increase to 1 tablet twice daily 60 tablet 1  . DEXILANT 60 MG capsule TAKE 1 CAPSULE BY MOUTH EVERY DAY 30 capsule 0  . dicyclomine (BENTYL) 20 MG tablet Take one tablet 15 minutes before meals 90 tablet 1  . diltiazem (CARDIZEM CD) 120 MG 24 hr capsule TAKE 1 CAPSULE BY MOUTH EVERY DAY (FOR FUTURE REFILLS MAKE A DOCTORS VISIT) 90 capsule 3  . fenofibrate (TRICOR) 145 MG tablet TAKE 1 TABLET BY MOUTH EVERY DAY 30 tablet 11  . fluticasone (FLONASE) 50 MCG/ACT nasal spray Place 2 sprays into both nostrils daily as needed for allergies. 16 g 3  . fluticasone furoate-vilanterol (BREO ELLIPTA) 200-25 MCG/INH AEPB Inhale 1 puff into the lungs daily.    . furosemide (LASIX) 40 MG tablet TAKE 1 TABLET BY MOUTH TWICE A DAY 180 tablet 3  . gabapentin (NEURONTIN) 100 MG capsule TAKE 1 CAPSULE BY MOUTH THREE TIMES A DAY 270 capsule 0  . glucose blood (BAYER CONTOUR TEST) test strip Use as instructed to check sugar 3 times daily 300 each 5  . insulin aspart (NOVOLOG FLEXPEN) 100 UNIT/ML FlexPen Inject 35-40 Units into the skin 3 (three) times daily with meals. 15 pen 5  . Insulin Glargine (TOUJEO SOLOSTAR) 300 UNIT/ML SOPN Inject 40 Units into the skin at bedtime. 5 pen 11  . Insulin Pen Needle 33G X 4 MM MISC 1 each by Does not apply route 4 (four) times daily. 200 each 11  . ipratropium (ATROVENT) 0.06 % nasal spray Place 2 sprays into both nostrils 4 (four) times daily. 15 mL 0  . isosorbide mononitrate (IMDUR) 30 MG 24 hr tablet Take 1 tablet (30 mg total) by mouth at bedtime. 90 tablet  3  . KLOR-CON M20 20 MEQ tablet TAKE 1 & 1/2 TABLETS BY MOUTH TWICE A DAY  2  . linaclotide (LINZESS) 290 MCG CAPS capsule Take 1 capsule (290 mcg total) by mouth daily before breakfast. 30 capsule 11  . LORazepam (ATIVAN) 0.5 MG tablet Take 0.5 mg by mouth every 12 (twelve) hours as needed for anxiety.    . metFORMIN (GLUCOPHAGE) 1000 MG tablet TAKE 1 TABLET BY MOUTH TWICE A DAY WITH A MEAL 180 tablet 0  . methylPREDNISolone (MEDROL DOSEPAK) 4 MG TBPK tablet 6-5-4-3-2-1-off 21 tablet 0  . NEOMYCIN-POLYMYXIN-HYDROCORTISONE (CORTISPORIN) 1 % SOLN OTIC solution Apply 1-2 drops to toe  BID after soaking 10 mL 1  . nitroGLYCERIN (NITROSTAT) 0.4 MG SL tablet Place 1 tablet (0.4 mg total) under the tongue every 5 (five) minutes as needed for chest pain. Reported on 01/05/2016 25 tablet 6  . ondansetron (ZOFRAN-ODT) 4 MG disintegrating tablet Take 1 tablet (4 mg total) by mouth every 8 (eight) hours as needed for nausea or vomiting. 20 tablet 1  . oxyCODONE-acetaminophen (PERCOCET/ROXICET) 5-325 MG tablet Take 1 tablet by mouth every 4 (four) hours as needed for severe pain. 8 tablet 0  . PARoxetine Mesylate (BRISDELLE) 7.5 MG CAPS Take 7.5 mg by mouth daily.    . Potassium Chloride ER 20 MEQ TBCR TAKE ONE AND ONE-HALF TABLETS BY MOUTH 2 TIMES DAILY 270 tablet 2  . predniSONE (DELTASONE) 5 MG tablet Take 5 mg by mouth daily with breakfast.    . senna (SENOKOT) 8.6 MG TABS tablet Take 1 tablet (8.6 mg total) by mouth daily as needed for mild constipation or moderate constipation. 120 each 0  . telmisartan (MICARDIS) 20 MG tablet TAKE 1 TABLET BY MOUTH EVERY DAY 90 tablet 2  . triamcinolone cream (KENALOG) 0.1 % Apply 1 application topically 2 (two) times daily as needed (for rash. stop after 7-10 days.). 80 g 0  . venlafaxine XR (EFFEXOR-XR) 75 MG 24 hr capsule Take 1 capsule (75 mg total) by mouth 2 (two) times daily.    . Vitamin D-Vitamin K (K2 PLUS D3) [phone] MCG-UNIT TABS cholecalciferol (vit D3) 1,000  unit-vitamin K2 (MK4) 100 mcg tablet  Take by oral route.     No current facility-administered medications for this visit.     Objective: BP (!) 120/50 (BP Location: Left Arm, Patient Position: Sitting, Cuff Size: Large)   Pulse 82   Temp 98.5 F (36.9 C) (Oral)   Ht 5\' 6"  (1.676 m)   Wt 255 lb 3.2 oz (115.8 kg)   SpO2 95%   BMI 41.19 kg/m  Gen: NAD, resting comfortably HEENT: Turbinates erythematous with yellow drainage, TM normal, pharynx mildly erythematous with no tonsilar exudate or edema, maxillary sinus tenderness CV: RRR no murmurs rubs or gallops Lungs: CTAB no crackles, wheeze, rhonchi Abdomen: soft/morbid obesity noted Ext: Trace edema Skin: warm, dry, no rash Neuro: Speech normal, moves all extremities  Assessment/Plan:  Sinsusitis And likely flare of sarcoidosis This is likely viral sinusitis given only 4 days of symptoms.  With that being said patient has a tendency to get sick very quickly.  With her baseline sarcoidosis-we will give her a 5-day boost of prednisone.  If she is not improving within 48 hours she will start the Augmentin which was printed for her.  I would advise her to get plenty of rest during this time.  I normally would recommend good hydration but she has congestive heart failure-should remain on current regimen of liquids.  She knows to see [place] back if symptoms are not improving on above regimen or if they are worsening-particularly see her back for fever or worsening shortness of breath.   Given her significant comorbid conditions I also advised her to submit a letter to be excused from jury duty later this year  Noted elevated BMI as well- may have element of obesity hypoventilation as well-weight loss would be beneficial  Meds ordered this encounter  Medications  . predniSONE (DELTASONE) 50 MG tablet    Sig: Take 1 tablet daily    Dispense:  5 tablet    Refill:  0  . amoxicillin-clavulanate (AUGMENTIN) 875-125 MG  tablet    Sig: Take 1  tablet by mouth 2 (two) times daily for 7 days.    Dispense:  14 tablet    Refill:  0    Garret Reddish, MD

## 2018-08-10 NOTE — Therapy (Signed)
[facility] [address] [place], [name], [address] Phone: [phone]   Fax:  [phone]  Physical Therapy Treatment  Patient Details  Name: [name] MRN: [id number] Date of Birth: [birth year] Referring Provider (PT): [name]   Encounter Date: 08/10/2018  PT End of Session - 08/10/18 1230    Visit Number  3    Number of Visits  12    Date for PT Re-Evaluation  09/12/18    PT Start Time  1215    PT Stop Time  1255    PT Time Calculation (min)  40 min    Activity Tolerance  Patient tolerated treatment well    Behavior During Therapy  [facility] for tasks assessed/performed       Past Medical History:  Diagnosis Date  . Abnormal SPEP 04/17/2014  . Acute left ankle pain 01/26/2017  . ANEMIA-UNSPECIFIED 09/18/2009  . Anxiety   . Arthritis   . Chronic diastolic heart failure, NYHA class 2 (HCC)    Normal LVEDP by May 2018  . COPD (chronic obstructive pulmonary disease) ([place])   . Depression   . DIABETES MELLITUS, TYPE II 08/21/2006  . Diabetic osteomyelitis ([place]) 05/29/2015  . Fracture of 5th metatarsal    non union  . GERD 08/21/2006  . GOUT 08/20/2010  . Hx of umbilical hernia repair   . HYPERLIPIDEMIA 08/21/2006  . HYPERTENSION 08/21/2006  . Infection of wound due to methicillin resistant Staphylococcus aureus (MRSA)   . Internal hemorrhoids   . Multiple allergies 10/14/2016  . OBESITY 06/04/2009  . Onychomycosis 10/27/2015  . Osteomyelitis of left foot ([place]) 05/29/2015  . Pulmonary sarcoidosis ([place])    Followed locally by pulmonology, but also by Dr. Casper Harrison at [facility]  . Right knee pain 01/26/2017  . Vocal cord dysfunction   . Wears partial dentures     Past Surgical History:  Procedure Laterality Date  . ABDOMINAL HYSTERECTOMY    . APPENDECTOMY    . BLADDER SUSPENSION  11/11/2011   Procedure: TRANSVAGINAL TAPE (TVT) PROCEDURE;  Surgeon: Olga Millers, MD;  Location: [facility];  Service:  Gynecology;  Laterality: N/A;  . CAROTIDS  02/18/11   CAROTID DUPLEX; VERTEBRALS ARE PATENT WITH ANTEGRADE FLOW. ICA/CCA RATIO 1.61 ON RIGHT AND 0.75 ON LEFT  . CHOLECYSTECTOMY  1984  . CYSTOSCOPY  11/11/2011   Procedure: CYSTOSCOPY;  Surgeon: Olga Millers, MD;  Location: [facility];  Service: Gynecology;  Laterality: N/A;  . EXTUBATION (ENDOTRACHEAL) IN OR N/A 09/23/2017   Procedure: EXTUBATION (ENDOTRACHEAL) IN OR;  Surgeon: Helayne Seminole, MD;  Location: [facility];  Service: ENT;  Laterality: N/A;  . FIBEROPTIC LARYNGOSCOPY AND TRACHEOSCOPY N/A 09/23/2017   Procedure: FLEXIBLE FIBEROPTIC LARYNGOSCOPY;  Surgeon: Helayne Seminole, MD;  Location: [facility];  Service: ENT;  Laterality: N/A;  . FRACTURE SURGERY     foot  . HERNIA REPAIR    . I&D EXTREMITY Left 06/27/2015   Procedure: Partial Excision Left Calcaneus, Place Antibiotic Beads, and Wound VAC;  Surgeon: Newt Minion, MD;  Location: [facility];  Service: Orthopedics;  Laterality: Left;  . KNEE ARTHROSCOPY     right  . LEFT AND RIGHT HEART CATHETERIZATION WITH CORONARY ANGIOGRAM N/A 04/23/2013   Procedure: LEFT AND RIGHT HEART CATHETERIZATION WITH CORONARY ANGIOGRAM;  Surgeon: Leonie Man, MD;  Location: [facility] C[facility];  Service: Cardiovascular;  Laterality: N/A;  . LEFT HEART CATH AND CORONARY ANGIOGRAPHY N/A 03/11/2017   Procedure: Left  Heart Cath and Coronary Angiography;  Surgeon: Sherren Mocha, MD;  Location: [facility] CV LAB; angiographically minimal CAD in the LAD otherwise normal.;  Normal LVEDP.  FALSE POSITIVE MYOVIEW  . LEFT HEART CATH AND CORONARY ANGIOGRAPHY  07/20/2010   LVEF 50-55% WITH VERY MILD GLOBAL HYPOKINESIA; ESSENTIALLY NORMAL CORONARY ARTERIES; NORMAL LV FUNCTION  . METATARSAL OSTEOTOMY WITH OPEN REDUCTION INTERNAL FIXATION (ORIF) METATARSAL WITH FUSION Left 04/09/2014   Procedure: LEFT FOOT FRACTURE OPEN TREATMENT METATARSAL INCLUDES INTERNAL FIXATION EACH;  Surgeon: Lorn Junes, MD;  Location: [facility];  Service: Orthopedics;  Laterality: Left;  . NISSEN FUNDOPLICATION  5852  . NM MYOVIEW LTD  03/09/2013   Lexiscan --> EF 50%; NORMAL MYOCARDIAL PERFUSION STUDY - breast attenuation  . NM MYOVIEW LTD  03/10/2017   : Moderate size "stress-induced "perfusion defect at the apex as well as "ill-defined stress-induced perfusion defect in the lateral wall.  EF 55%.  INTERMEDIATE risk. -->  FALSE POSITIVE  . Right and left CARDIAC CATHETERIZATION  04/23/2013   Angiographic normal coronaries; LVEDP 20 mmHg, PCWP 12-14 mmHg, RAP 12 mmHg.; Fick CO/CI 4.9/2.2  . TOTAL KNEE ARTHROPLASTY Right 06/29/2017   Procedure: RIGHT TOTAL KNEE ARTHROPLASTY;  Surgeon: Newt Minion, MD;  Location: [facility];  Service: Orthopedics;  Laterality: Right;  . TOTAL KNEE ARTHROPLASTY Left 12/07/2017  . TOTAL KNEE ARTHROPLASTY Left 12/07/2017   Procedure: LEFT TOTAL KNEE ARTHROPLASTY;  Surgeon: Newt Minion, MD;  Location: [facility];  Service: Orthopedics;  Laterality: Left;  . TRACHEOSTOMY TUBE PLACEMENT N/A 09/20/2017   Procedure: AWAKE INTUBATION WITH ANESTHESIA WITH VIDEO ASSISTANCE;  Surgeon: Helayne Seminole, MD;  Location: [facility];  Service: ENT;  Laterality: N/A;  . TRANSTHORACIC ECHOCARDIOGRAM  08/2014   Normal LV size and function.  Mild LVH.  EF 55-60%.  Normal regional wall motion.  GR 1 DD.  Normal RV size and function .  [name] TUBAL LIGATION     with reversal in 1994  . VENTRAL HERNIA REPAIR      There were no vitals filed for this visit.  Subjective Assessment - 08/10/18 1229    Subjective  Pt states decreased pain since last visit (yesterday )     Currently in Pain?  Yes    Pain Score  2     Pain Location  Neck    Pain Orientation  Right    Pain Descriptors / Indicators  Aching    Pain Type  Acute pain    Pain Onset  More than a month ago    Pain Frequency  Intermittent                       [facility] Adult PT Treatment/Exercise - 08/10/18 0001      Exercises   Exercises   Neck      Neck Exercises: Theraband   Rows  20 reps;Red      Neck Exercises: Seated   Neck Retraction  20 reps    Cervical Rotation  10 reps    Shoulder Rolls  10 reps    Other Seated Exercise  --      Electrical Stimulation   Electrical Stimulation Location  Rt neck/shoulder      Manual Therapy   Manual Therapy  Soft tissue mobilization;Passive ROM;Manual Traction    Soft tissue mobilization  STM and IASTM to suboccipitals, cervical paraspinals and upper trap,     Passive ROM  Manual UT stretching on  R;  sub occipital release    Manual Traction  10 sec x10;       Neck Exercises: Stretches   Upper Trapezius Stretch  30 seconds;3 reps;Right;Left    Levator Stretch  3 reps;30 seconds;Right;Left                  PT Long Term Goals - 08/01/18 1058      PT LONG TERM GOAL #1   Title  independent with HEP    Status  New    Target Date  09/12/18      PT LONG TERM GOAL #2   Title  improve cervical ROM to [facility] without increase in pain    Status  New    Target Date  09/12/18      PT LONG TERM GOAL #3   Title  report ability to perform upper body dressing without increase in pain for improved function    Status  New    Target Date  09/12/18            Plan - 08/10/18 1402    Clinical Impression Statement  Pt with soreness and tenderness at R cervical musculature and sub occipital region. Addressed with STM and IASTM for decreasing muscle tightness. Postural ther ex for HEP reviewed. Pt with much difficulty with Scap retraction, and not getting compensation from upper traps. Plan to progress as tolerated.     Rehab Potential  Good    PT Frequency  2x / week    PT Duration  6 weeks    PT Treatment/Interventions  ADLs/Self Care Home Management;Cryotherapy;Electrical Stimulation;Iontophoresis 4mg /ml Dexamethasone;Moist Heat;Traction;Therapeutic exercise;Therapeutic activities;Functional mobility training;Ultrasound;Patient/family education;Manual techniques;Taping;Dry  needling    PT Next Visit Plan  review HEP, DN/manual/modalities PRN, continue postural strengthening/stretching    PT Home Exercise Plan  Access Code: [email or web address]     Consulted and Agree with Plan of Care  Patient       Patient will benefit from skilled therapeutic intervention in order to improve the following deficits and impairments:  Hypomobility, Increased fascial restricitons, Increased muscle spasms, Decreased strength, Decreased range of motion, Postural dysfunction, Pain  Visit Diagnosis: Cervicalgia  Cramp and spasm  Abnormal posture     Problem List Patient Active Problem List   Diagnosis Date Noted  . Subcortical microvascular ischemic occlusive disease 07/12/2018  . Rapid palpitations 07/12/2018  . Impingement of left ankle joint 06/26/2018  . Constipation 04/21/2018  . Anxiety 04/05/2018  . Total knee replacement status, left 12/07/2017  . Unilateral primary osteoarthritis, left knee 11/07/2017  . Dysphagia 09/20/2017  . Epiglottitis   . Plantar fasciitis, right 07/13/2017  . S/P total knee arthroplasty, right 06/29/2017  . Osteoarthrosis, localized, primary, knee, right   . Sprain of calcaneofibular ligament of right ankle 06/06/2017  . Osteoarthritis of right knee 01/26/2017  . Multiple allergies 10/14/2016  . Onychomycosis 10/27/2015  . CKD (chronic kidney disease) stage 3, GFR 30-59 ml/min (HCC) 04/27/2015  . MRSA (methicillin resistant staph aureus) culture positive 03/27/2015  . Wound infection complicating hardware ([place]) 03/27/2015  . Fatty liver 09/30/2014  . Hot flashes 07/15/2014  . Abnormal SPEP 04/17/2014  . Fracture of left leg 04/17/2014  . Cushingoid side effect of steroids ([place]) 04/17/2014  . Internal hemorrhoids   . Depression   . Preoperative clearance 03/25/2014  . Obstructive chronic bronchitis without exacerbation ([place]) 09/18/2013  . Chest pain 04/11/2013  . Hypertensive heart disease with chronic diastolic congestive heart failure  ([place])   . Solitary  pulmonary nodule, on CT 02/2013 - stable over 2 years in 2015 02/20/2013  . Gout 08/20/2010  . Anemia 09/18/2009  . Morbid obesity ([place]) 06/04/2009  . Sleep apnea 04/21/2009  . Sarcoidosis of lung ([place]) 04/10/2007  . Hyperlipidemia 08/21/2006  . Essential hypertension 08/21/2006  . GERD 08/21/2006  . Type II diabetes mellitus with renal manifestations ([place]) 08/21/2006   Lyndee Hensen, PT, DPT 2:03 PM  08/10/18    [facility] [place], [name], [address] Phone: [phone]   Fax:  [phone]  Name: TEYONA NICHELSON MRN: [id number] Date of Birth: 01/01/1961

## 2018-08-11 ENCOUNTER — Ambulatory Visit: Payer: BLUE CROSS/BLUE SHIELD | Admitting: Endocrinology

## 2018-08-11 ENCOUNTER — Encounter: Payer: Self-pay | Admitting: Family Medicine

## 2018-08-14 ENCOUNTER — Ambulatory Visit (INDEPENDENT_AMBULATORY_CARE_PROVIDER_SITE_OTHER): Payer: BLUE CROSS/BLUE SHIELD | Admitting: Physical Therapy

## 2018-08-14 ENCOUNTER — Encounter: Payer: Self-pay | Admitting: Physical Therapy

## 2018-08-14 DIAGNOSIS — R293 Abnormal posture: Secondary | ICD-10-CM

## 2018-08-14 DIAGNOSIS — H10413 Chronic giant papillary conjunctivitis, bilateral: Secondary | ICD-10-CM | POA: Diagnosis not present

## 2018-08-14 DIAGNOSIS — M542 Cervicalgia: Secondary | ICD-10-CM | POA: Diagnosis not present

## 2018-08-14 DIAGNOSIS — R252 Cramp and spasm: Secondary | ICD-10-CM | POA: Diagnosis not present

## 2018-08-14 NOTE — Therapy (Signed)
[facility] [address] [place], [name], [address] Phone: [phone]   Fax:  [phone]  Physical Therapy Treatment  Patient Details  Name: [name] MRN: [id number] Date of Birth: 1960-11-15 Referring Provider (PT): [name]   Encounter Date: 08/14/2018  PT End of Session - 08/14/18 1300    Visit Number  4    Number of Visits  12    Date for PT Re-Evaluation  09/12/18    PT Start Time  5465    PT Stop Time  1326    PT Time Calculation (min)  30 min    Activity Tolerance  Patient tolerated treatment well    Behavior During Therapy  [facility] for tasks assessed/performed       Past Medical History:  Diagnosis Date  . Abnormal SPEP 04/17/2014  . Acute left ankle pain 01/26/2017  . ANEMIA-UNSPECIFIED 09/18/2009  . Anxiety   . Arthritis   . Chronic diastolic heart failure, NYHA class 2 (HCC)    Normal LVEDP by May 2018  . COPD (chronic obstructive pulmonary disease) ([place])   . Depression   . DIABETES MELLITUS, TYPE II 08/21/2006  . Diabetic osteomyelitis ([place]) 05/29/2015  . Fracture of 5th metatarsal    non union  . GERD 08/21/2006  . GOUT 08/20/2010  . Hx of umbilical hernia repair   . HYPERLIPIDEMIA 08/21/2006  . HYPERTENSION 08/21/2006  . Infection of wound due to methicillin resistant Staphylococcus aureus (MRSA)   . Internal hemorrhoids   . Multiple allergies 10/14/2016  . OBESITY 06/04/2009  . Onychomycosis 10/27/2015  . Osteomyelitis of left foot ([place]) 05/29/2015  . Pulmonary sarcoidosis ([place])    Followed locally by pulmonology, but also by Dr. Casper Harrison at [facility]  . Right knee pain 01/26/2017  . Vocal cord dysfunction   . Wears partial dentures     Past Surgical History:  Procedure Laterality Date  . ABDOMINAL HYSTERECTOMY    . APPENDECTOMY    . BLADDER SUSPENSION  11/11/2011   Procedure: TRANSVAGINAL TAPE (TVT) PROCEDURE;  Surgeon: Olga Millers, MD;  Location: [facility];  Service:  Gynecology;  Laterality: N/A;  . CAROTIDS  02/18/11   CAROTID DUPLEX; VERTEBRALS ARE PATENT WITH ANTEGRADE FLOW. ICA/CCA RATIO 1.61 ON RIGHT AND 0.75 ON LEFT  . CHOLECYSTECTOMY  1984  . CYSTOSCOPY  11/11/2011   Procedure: CYSTOSCOPY;  Surgeon: Olga Millers, MD;  Location: [facility];  Service: Gynecology;  Laterality: N/A;  . EXTUBATION (ENDOTRACHEAL) IN OR N/A 09/23/2017   Procedure: EXTUBATION (ENDOTRACHEAL) IN OR;  Surgeon: Helayne Seminole, MD;  Location: [facility];  Service: ENT;  Laterality: N/A;  . FIBEROPTIC LARYNGOSCOPY AND TRACHEOSCOPY N/A 09/23/2017   Procedure: FLEXIBLE FIBEROPTIC LARYNGOSCOPY;  Surgeon: Helayne Seminole, MD;  Location: [facility];  Service: ENT;  Laterality: N/A;  . FRACTURE SURGERY     foot  . HERNIA REPAIR    . I&D EXTREMITY Left 06/27/2015   Procedure: Partial Excision Left Calcaneus, Place Antibiotic Beads, and Wound VAC;  Surgeon: Newt Minion, MD;  Location: [facility];  Service: Orthopedics;  Laterality: Left;  . KNEE ARTHROSCOPY     right  . LEFT AND RIGHT HEART CATHETERIZATION WITH CORONARY ANGIOGRAM N/A 04/23/2013   Procedure: LEFT AND RIGHT HEART CATHETERIZATION WITH CORONARY ANGIOGRAM;  Surgeon: Leonie Man, MD;  Location: [facility] C[facility];  Service: Cardiovascular;  Laterality: N/A;  . LEFT HEART CATH AND CORONARY ANGIOGRAPHY N/A 03/11/2017   Procedure: Left  Heart Cath and Coronary Angiography;  Surgeon: Sherren Mocha, MD;  Location: [facility]; angiographically minimal CAD in the LAD otherwise normal.;  Normal LVEDP.  FALSE POSITIVE MYOVIEW  . LEFT HEART CATH AND CORONARY ANGIOGRAPHY  07/20/2010   LVEF 50-55% WITH VERY MILD GLOBAL HYPOKINESIA; ESSENTIALLY NORMAL CORONARY ARTERIES; NORMAL LV FUNCTION  . METATARSAL OSTEOTOMY WITH OPEN REDUCTION INTERNAL FIXATION (ORIF) METATARSAL WITH FUSION Left 04/09/2014   Procedure: LEFT FOOT FRACTURE OPEN TREATMENT METATARSAL INCLUDES INTERNAL FIXATION EACH;  Surgeon: Lorn Junes, MD;  Location: [facility];  Service: Orthopedics;  Laterality: Left;  . NISSEN FUNDOPLICATION  3976  . NM MYOVIEW LTD  03/09/2013   Lexiscan --> EF 50%; NORMAL MYOCARDIAL PERFUSION STUDY - breast attenuation  . NM MYOVIEW LTD  03/10/2017   : Moderate size "stress-induced "perfusion defect at the apex as well as "ill-defined stress-induced perfusion defect in the lateral wall.  EF 55%.  INTERMEDIATE risk. -->  FALSE POSITIVE  . Right and left CARDIAC CATHETERIZATION  04/23/2013   Angiographic normal coronaries; LVEDP 20 mmHg, PCWP 12-14 mmHg, RAP 12 mmHg.; Fick CO/CI 4.9/2.2  . TOTAL KNEE ARTHROPLASTY Right 06/29/2017   Procedure: RIGHT TOTAL KNEE ARTHROPLASTY;  Surgeon: Newt Minion, MD;  Location: [facility];  Service: Orthopedics;  Laterality: Right;  . TOTAL KNEE ARTHROPLASTY Left 12/07/2017  . TOTAL KNEE ARTHROPLASTY Left 12/07/2017   Procedure: LEFT TOTAL KNEE ARTHROPLASTY;  Surgeon: Newt Minion, MD;  Location: [facility];  Service: Orthopedics;  Laterality: Left;  . TRACHEOSTOMY TUBE PLACEMENT N/A 09/20/2017   Procedure: AWAKE INTUBATION WITH ANESTHESIA WITH VIDEO ASSISTANCE;  Surgeon: Helayne Seminole, MD;  Location: [facility];  Service: ENT;  Laterality: N/A;  . TRANSTHORACIC ECHOCARDIOGRAM  08/2014   Normal LV size and function.  Mild LVH.  EF 55-60%.  Normal regional wall motion.  GR 1 DD.  Normal RV size and function .  [name] TUBAL LIGATION     with reversal in 1994  . VENTRAL HERNIA REPAIR      There were no vitals filed for this visit.  Subjective Assessment - 08/14/18 1259    Subjective  Pt states no pain today or over the weekend.     Currently in Pain?  No/denies    Pain Score  0-No pain                       [facility] Adult PT Treatment/Exercise - 08/14/18 1300      Exercises   Exercises  Neck      Neck Exercises: Theraband   Rows  20 reps;Red      Neck Exercises: Standing   Other Standing Exercises  AROM: shoulder abd to 90 deg x15;  [place]aption x15;        Neck  Exercises: Seated   Neck Retraction  20 reps    Cervical Rotation  10 reps    Shoulder Rolls  10 reps    Other Seated Exercise  shoulder pulley x2 min flexion;       Neck Exercises: Supine   Neck Retraction  20 reps    Shoulder Flexion  20 reps    Shoulder Flexion Weights (lbs)  2    Shoulder Flexion Limitations  cane, with education for shoulder posture;     Other Supine Exercise  horizontal Abd x15 bil;       Electrical Stimulation   Electrical Stimulation Location  Rt neck/shoulder  Manual Therapy   Manual Therapy  Soft tissue mobilization;Passive ROM;Manual Traction    Soft tissue mobilization  --    Passive ROM  Manual UT stretching bil;      Manual Traction  --      Neck Exercises: Stretches   Upper Trapezius Stretch  30 seconds;3 reps;Right;Left    Levator Stretch  --                  PT Long Term Goals - 08/01/18 1058      PT LONG TERM GOAL #1   Title  independent with HEP    Status  New    Target Date  09/12/18      PT LONG TERM GOAL #2   Title  improve cervical ROM to [facility] without increase in pain    Status  New    Target Date  09/12/18      PT LONG TERM GOAL #3   Title  report ability to perform upper body dressing without increase in pain for improved function    Status  New    Target Date  09/12/18            Plan - 08/14/18 1335    Clinical Impression Statement  Pt with much decreased pain today, with palpation and with movement. Progressed UE and postural strengthening today, with no increased pain. Pt with difficulty keeping proper shoulder posture, requires mod cueing to decrease UT compensation. Plan to work towards d/c if pts pain is improving.     Rehab Potential  Good    PT Frequency  2x / week    PT Duration  6 weeks    PT Treatment/Interventions  ADLs/Self Care Home Management;Cryotherapy;Electrical Stimulation;Iontophoresis 4mg /ml Dexamethasone;Moist Heat;Traction;Therapeutic exercise;Therapeutic activities;Functional  mobility training;Ultrasound;Patient/family education;Manual techniques;Taping;Dry needling    PT Next Visit Plan  review HEP, DN/manual/modalities PRN, continue postural strengthening/stretching    PT Home Exercise Plan  Access Code: [email or web address]     Consulted and Agree with Plan of Care  Patient       Patient will benefit from skilled therapeutic intervention in order to improve the following deficits and impairments:  Hypomobility, Increased fascial restricitons, Increased muscle spasms, Decreased strength, Decreased range of motion, Postural dysfunction, Pain  Visit Diagnosis: Cervicalgia  Cramp and spasm  Abnormal posture  Neck pain on right side     Problem List Patient Active Problem List   Diagnosis Date Noted  . Subcortical microvascular ischemic occlusive disease 07/12/2018  . Rapid palpitations 07/12/2018  . Impingement of left ankle joint 06/26/2018  . Constipation 04/21/2018  . Anxiety 04/05/2018  . Total knee replacement status, left 12/07/2017  . Unilateral primary osteoarthritis, left knee 11/07/2017  . Dysphagia 09/20/2017  . Epiglottitis   . Plantar fasciitis, right 07/13/2017  . S/P total knee arthroplasty, right 06/29/2017  . Osteoarthrosis, localized, primary, knee, right   . Sprain of calcaneofibular ligament of right ankle 06/06/2017  . Osteoarthritis of right knee 01/26/2017  . Multiple allergies 10/14/2016  . Onychomycosis 10/27/2015  . CKD (chronic kidney disease) stage 3, GFR 30-59 ml/min (HCC) 04/27/2015  . MRSA (methicillin resistant staph aureus) culture positive 03/27/2015  . Wound infection complicating hardware ([place]) 03/27/2015  . Fatty liver 09/30/2014  . Hot flashes 07/15/2014  . Abnormal SPEP 04/17/2014  . Fracture of left leg 04/17/2014  . Cushingoid side effect of steroids ([place]) 04/17/2014  . Internal hemorrhoids   . Depression   . Preoperative clearance 03/25/2014  . Obstructive chronic bronchitis  without exacerbation ([place])  09/18/2013  . Chest pain 04/11/2013  . Hypertensive heart disease with chronic diastolic congestive heart failure ([place])   . Solitary pulmonary nodule, on CT 02/2013 - stable over 2 years in 2015 02/20/2013  . Gout 08/20/2010  . Anemia 09/18/2009  . Morbid obesity ([place]) 06/04/2009  . Sleep apnea 04/21/2009  . Sarcoidosis of lung ([place]) 04/10/2007  . Hyperlipidemia 08/21/2006  . Essential hypertension 08/21/2006  . GERD 08/21/2006  . Type II diabetes mellitus with renal manifestations ([place]) 08/21/2006    Lyndee Hensen, PT, DPT 1:37 PM  08/14/18    [facility] [place], [name], [address] Phone: [phone]   Fax:  [phone]  Name: [name] MRN: [id number] Date of Birth: 11/14/1960

## 2018-08-15 ENCOUNTER — Encounter: Payer: Self-pay | Admitting: *Deleted

## 2018-08-15 NOTE — Progress Notes (Signed)
Per Dr. Milinda Pointer, a medical clearance request letter was sent to Dr. Yong Channel.

## 2018-08-16 ENCOUNTER — Encounter: Payer: Self-pay | Admitting: *Deleted

## 2018-08-16 ENCOUNTER — Encounter: Payer: BLUE CROSS/BLUE SHIELD | Admitting: Physical Therapy

## 2018-08-16 NOTE — Progress Notes (Signed)
I sent a medical clearance request letter to Dr. Asencion Noble and Dr. [name] under the recommendation of Dr. Garret Reddish.

## 2018-08-16 NOTE — Progress Notes (Signed)
She has had a pretty thorough Cardiac Evaluation in the past few years.   From a Cardiac Standpoint - would not recommend any additional work-up.  If you need to , you can stop ASA.  Otherwise - her main operative risk is probably more related to her pulmonary issues.  [name], MD

## 2018-08-16 NOTE — Progress Notes (Signed)
I sent a medical clearance request letter to Dr. Renato Shin on the recommendation of Dr. Garret Reddish.

## 2018-08-17 ENCOUNTER — Encounter: Payer: Self-pay | Admitting: *Deleted

## 2018-08-17 NOTE — Telephone Encounter (Signed)
I am calling to let you know I am going to mail you your letter to dismiss you from jury duty.  "Okay, thank you."

## 2018-08-17 NOTE — Progress Notes (Signed)
I mailed a note to [name] to get her dismissed from jury duty, that's scheduled for December 16.  She is scheduled to have surgery on November 21 and will not be able to ambulate well.

## 2018-08-18 ENCOUNTER — Ambulatory Visit: Payer: BLUE CROSS/BLUE SHIELD | Admitting: Endocrinology

## 2018-08-19 DIAGNOSIS — R002 Palpitations: Secondary | ICD-10-CM | POA: Diagnosis not present

## 2018-08-21 ENCOUNTER — Encounter: Payer: BLUE CROSS/BLUE SHIELD | Admitting: Physical Therapy

## 2018-08-21 ENCOUNTER — Telehealth: Payer: Self-pay | Admitting: *Deleted

## 2018-08-21 NOTE — Telephone Encounter (Signed)
[place] and tell him all of the information about her health.  We may not want to do her at all.

## 2018-08-21 NOTE — Telephone Encounter (Signed)
I received an email from [name] from [facility] stating we need to reschedule this patient's surgery.   Surgery has to be done at the hospital.   Dr. Laveda Abbe recommends patient be done at the hospital d/t chest pain & shob with out exertion. Reports to cardiologist can't walk to mailbox without becoming short of breath. May would need monitoring that can't be done outpatient. Notified Debbie & patient. Please forward to office.  Thanks,  [name], RN, BSN  Pre Anesthesia Assessment Nurse [facility]   (Dr. Milinda Pointer, Who do you want to refer her to?)

## 2018-08-22 DIAGNOSIS — D860 Sarcoidosis of lung: Secondary | ICD-10-CM | POA: Diagnosis not present

## 2018-08-22 DIAGNOSIS — Z6841 Body Mass Index (BMI) 40.0 and over, adult: Secondary | ICD-10-CM | POA: Diagnosis not present

## 2018-08-22 DIAGNOSIS — J9801 Acute bronchospasm: Secondary | ICD-10-CM | POA: Diagnosis not present

## 2018-08-22 DIAGNOSIS — J4550 Severe persistent asthma, uncomplicated: Secondary | ICD-10-CM | POA: Diagnosis not present

## 2018-08-22 IMAGING — RF DG SWALLOWING FUNCTION - NRPT [facility]
9 series · 20 of 24 positions shown · non-contrast
Comparison: none

[Series 1: cp_standard · 0.34mm/px · 2 of 45 frames shown (1 of 9)]
[frame 7/45]
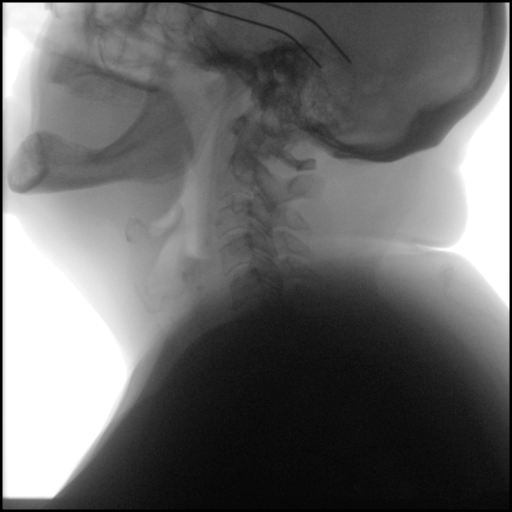
[frame 27/45]
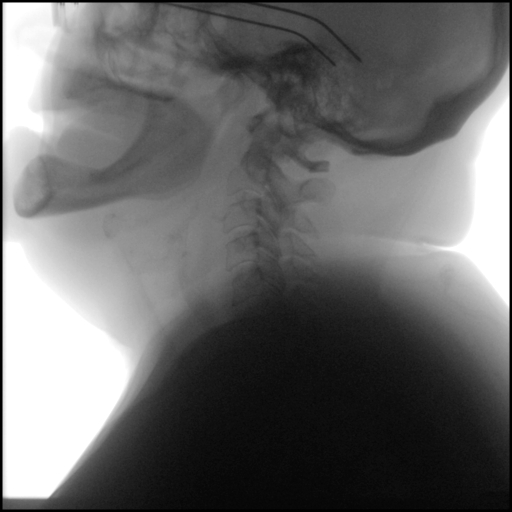

[Series 2: cp_standard · 0.34mm/px · 2 of 28 frames shown (2 of 9)]
[frame 15/28]
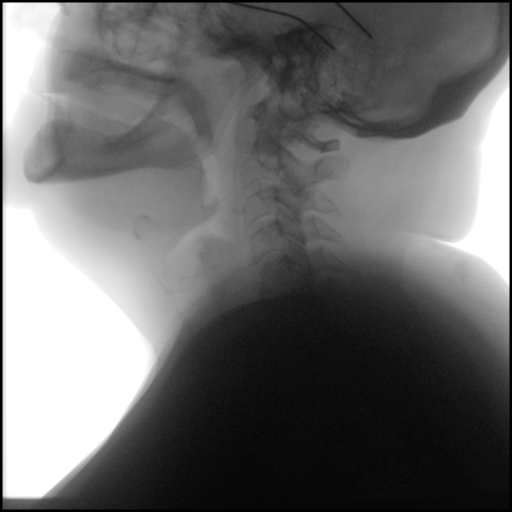
[frame 24/28]
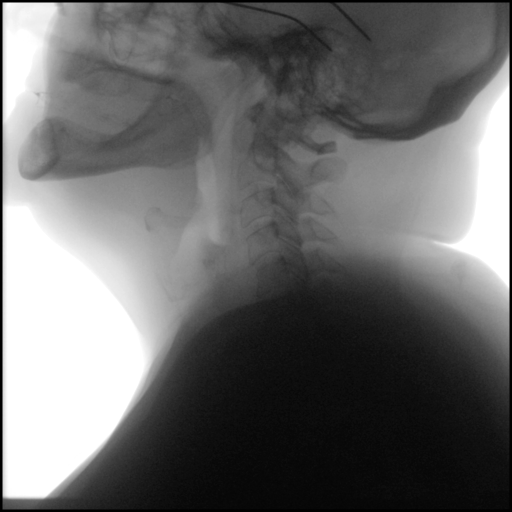

[Series 3: cp_standard · 0.34mm/px · 3 of 56 frames shown (3 of 9)]
[frame 9/56]
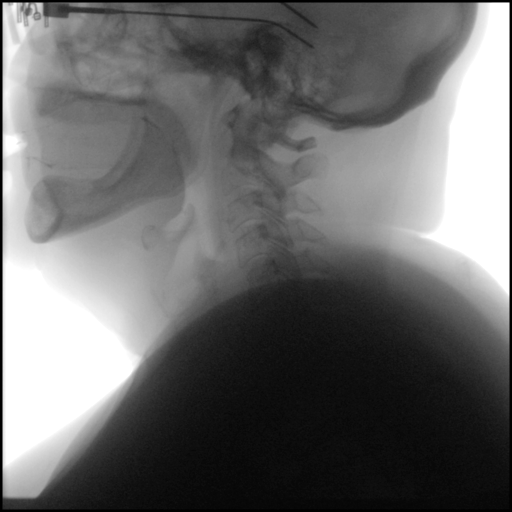
[frame 23/56]
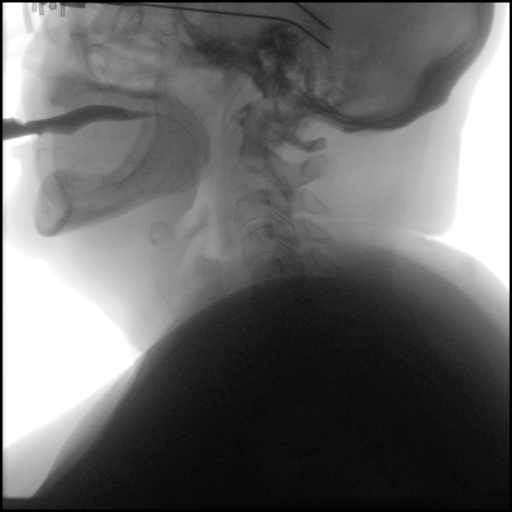
[frame 48/56]
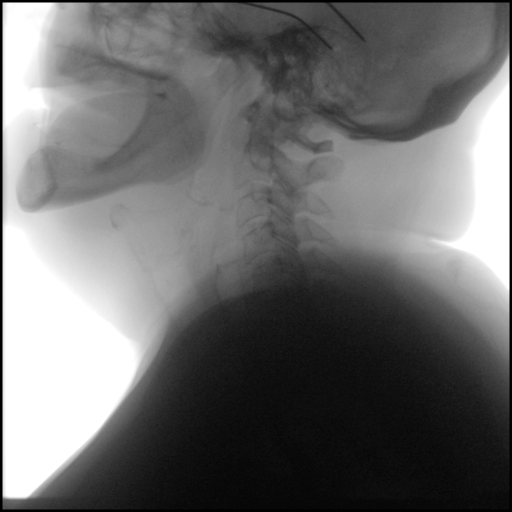

[Series 4: cp_standard · 0.34mm/px · 2 of 92 frames shown (4 of 9)]
[frame 47/92]
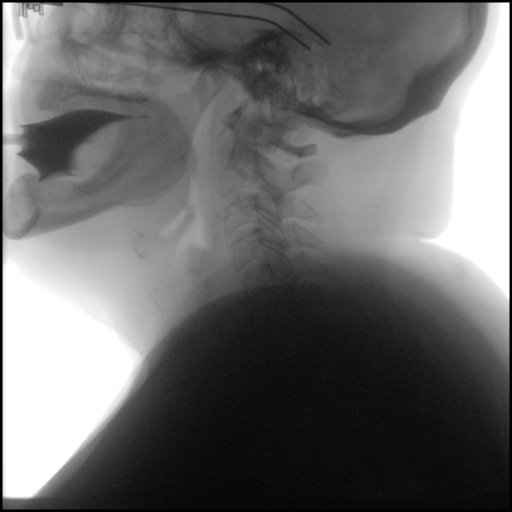
[frame 79/92]
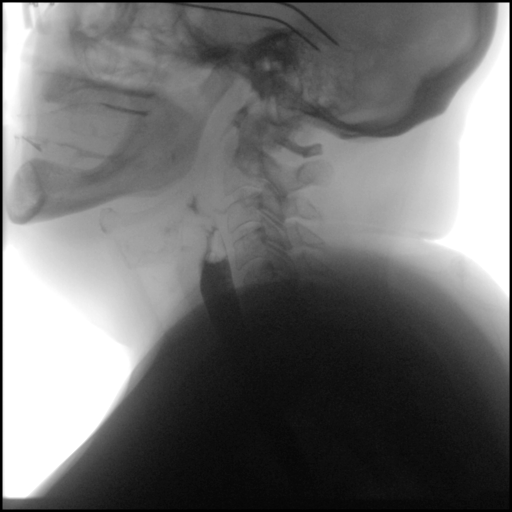

[Series 5: cp_standard · 0.34mm/px · 2 of 32 frames shown (5 of 9)]
[frame 5/32]
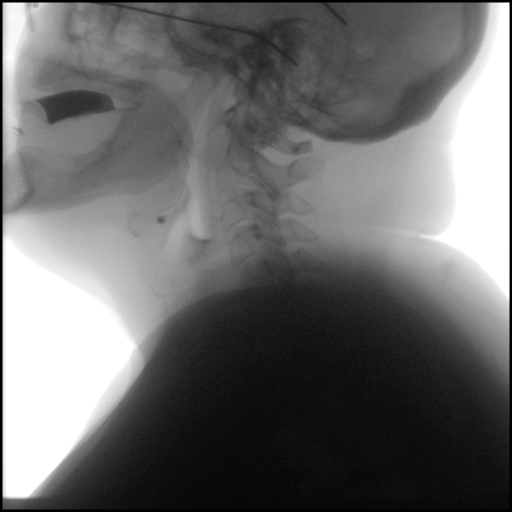
[frame 17/32]
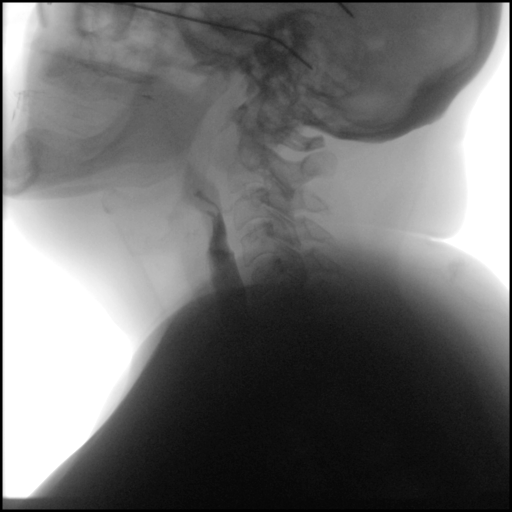

[Series 6: cp_standard · 0.34mm/px · 2 of 120 frames shown (6 of 9)]
[frame 19/120]
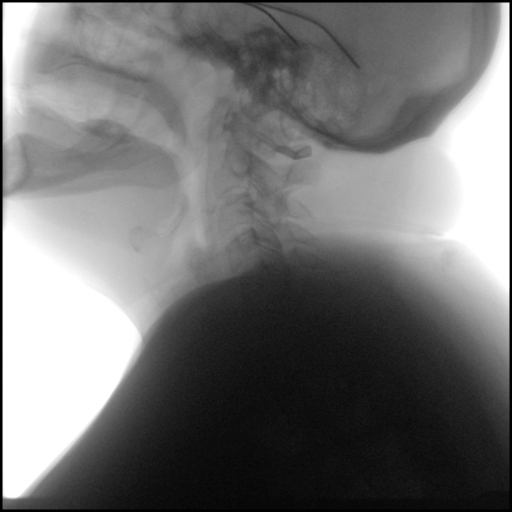
[frame 103/120]
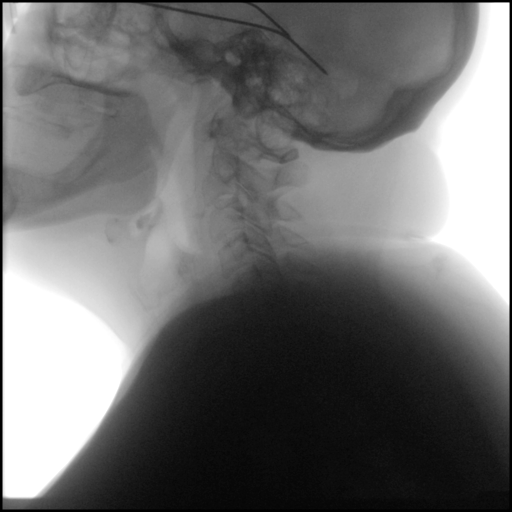

[Series 7: cp_standard · 0.34mm/px · 3 of 45 frames shown (7 of 9)]
[frame 7/45]
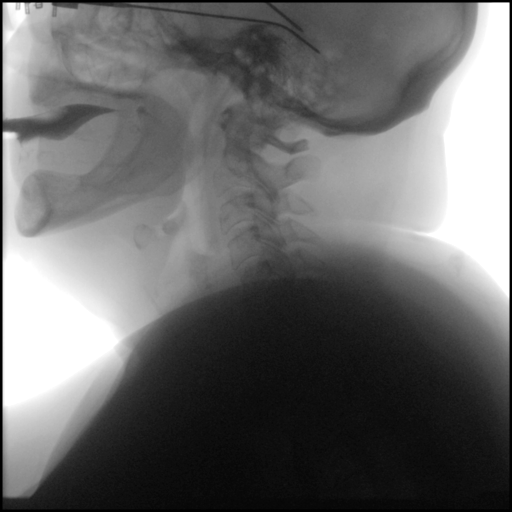
[frame 39/45]
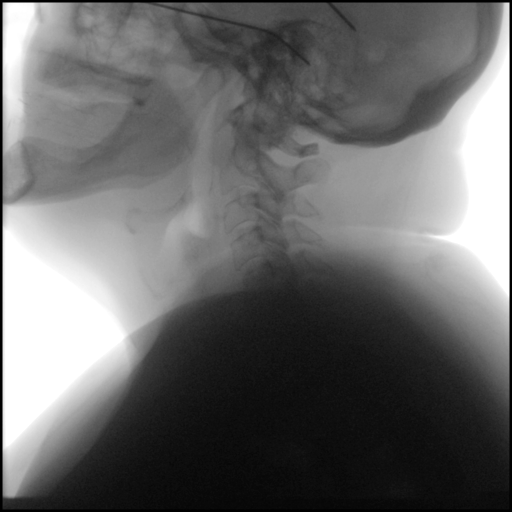
[frame 45/45]
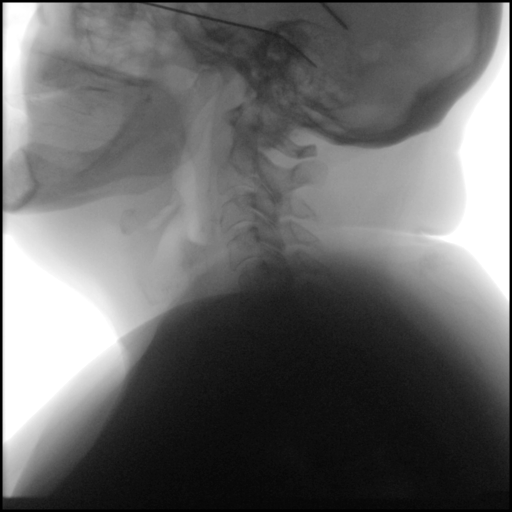

[Series 8: cp_standard · 0.34mm/px · 1 of 50 frames shown (8 of 9)]
[frame 8/50]
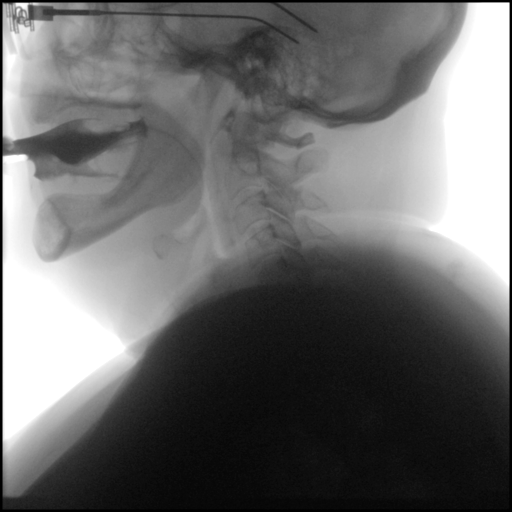

[Series 9: cp_standard · 0.36mm/px · 3 of 55 frames shown (9 of 9)]
[frame 9/55]
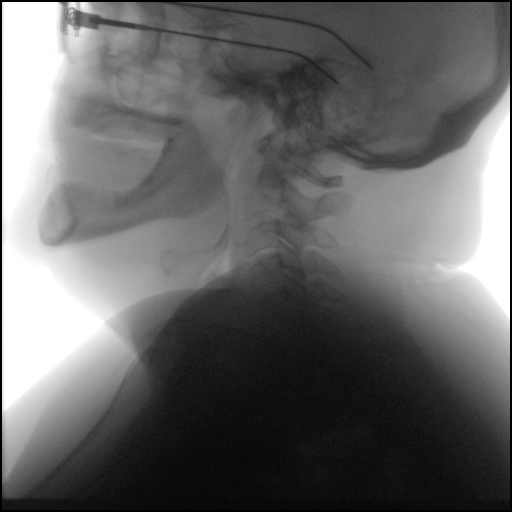
[frame 28/55]
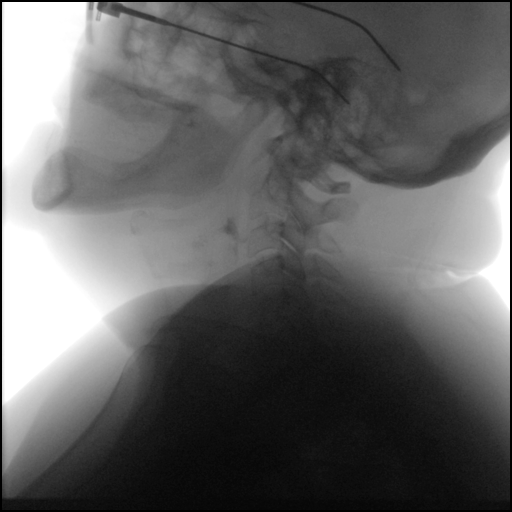
[frame 55/55]
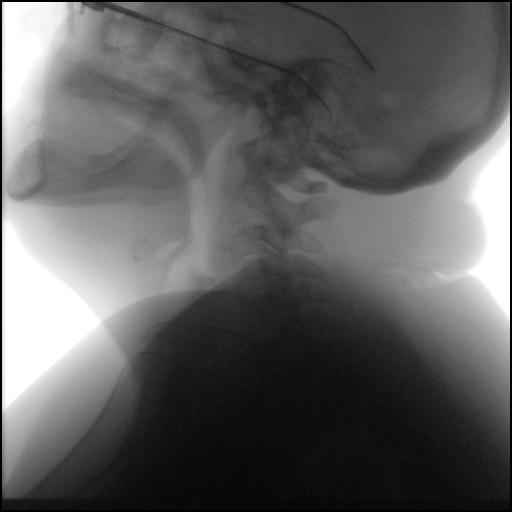

[20 of 24 positions shown; findings below may reference images not displayed]

FLUOROSCOPY FOR SWALLOWING FUNCTION STUDY:
Fluoroscopy was provided for swallowing function study, which was administered by a speech pathologist.  Final results and recommendations from this study are contained within the speech pathology report.

## 2018-08-22 NOTE — Telephone Encounter (Signed)
Thank you for letting me know the status.

## 2018-08-22 NOTE — Telephone Encounter (Signed)
I attempted to call the patient.  I need to make sure she's aware that we are cancelling her surgery that is scheduled for 08/31/2018.  Surgery cannot be performed at [facility] due to health issues.  Surgery will have to be performed at the hospital so the patient can be closely monitored.  "I am returning your call."  Yes, I was calling to inform you that we must cancel your surgery scheduled for 08/31/2018.  It cannot be performed at [facility].  Dr. Milinda Pointer does not perform surgery at any other facility other than [facility].  I am waiting on a response from him about how to proceed.  "Okay, thank you."

## 2018-08-23 ENCOUNTER — Ambulatory Visit (INDEPENDENT_AMBULATORY_CARE_PROVIDER_SITE_OTHER): Payer: BLUE CROSS/BLUE SHIELD

## 2018-08-23 ENCOUNTER — Ambulatory Visit: Payer: BLUE CROSS/BLUE SHIELD

## 2018-08-23 ENCOUNTER — Ambulatory Visit (INDEPENDENT_AMBULATORY_CARE_PROVIDER_SITE_OTHER): Payer: BLUE CROSS/BLUE SHIELD | Admitting: Physical Therapy

## 2018-08-23 ENCOUNTER — Ambulatory Visit (INDEPENDENT_AMBULATORY_CARE_PROVIDER_SITE_OTHER): Payer: BLUE CROSS/BLUE SHIELD | Admitting: Family Medicine

## 2018-08-23 ENCOUNTER — Encounter: Payer: Self-pay | Admitting: Physical Therapy

## 2018-08-23 ENCOUNTER — Encounter: Payer: BLUE CROSS/BLUE SHIELD | Admitting: Physical Therapy

## 2018-08-23 ENCOUNTER — Telehealth: Payer: Self-pay | Admitting: *Deleted

## 2018-08-23 ENCOUNTER — Encounter: Payer: Self-pay | Admitting: Family Medicine

## 2018-08-23 VITALS — BP 132/50 | HR 76 | Temp 97.8°F | Ht 66.0 in | Wt 254.4 lb

## 2018-08-23 DIAGNOSIS — R252 Cramp and spasm: Secondary | ICD-10-CM

## 2018-08-23 DIAGNOSIS — R0989 Other specified symptoms and signs involving the circulatory and respiratory systems: Secondary | ICD-10-CM | POA: Diagnosis not present

## 2018-08-23 DIAGNOSIS — R05 Cough: Secondary | ICD-10-CM | POA: Diagnosis not present

## 2018-08-23 DIAGNOSIS — D860 Sarcoidosis of lung: Secondary | ICD-10-CM | POA: Diagnosis not present

## 2018-08-23 DIAGNOSIS — J449 Chronic obstructive pulmonary disease, unspecified: Secondary | ICD-10-CM | POA: Diagnosis not present

## 2018-08-23 DIAGNOSIS — M542 Cervicalgia: Secondary | ICD-10-CM | POA: Diagnosis not present

## 2018-08-23 DIAGNOSIS — R293 Abnormal posture: Secondary | ICD-10-CM

## 2018-08-23 DIAGNOSIS — R059 Cough, unspecified: Secondary | ICD-10-CM

## 2018-08-23 DIAGNOSIS — Z23 Encounter for immunization: Secondary | ICD-10-CM

## 2018-08-23 MED ORDER — DOXYCYCLINE HYCLATE 100 MG PO TABS: 100.0000 mg | ORAL_TABLET | Freq: Two times a day (BID) | ORAL | 0 refills | Status: AC

## 2018-08-23 NOTE — Therapy (Signed)
[facility] [address] [place], [place], [address] Phone: [phone]   Fax:  [phone]  Physical Therapy Treatment/Discharge    Patient Details  Name: [name] MRN: [id number] Date of Birth: 11/01/1960 Referring Provider (PT): HUNTER, STEPHEN O   Encounter Date: 08/23/2018  PT End of Session - 08/23/18 0927    Visit Number  5    Number of Visits  12    Date for PT Re-Evaluation  09/12/18    PT Start Time  0850    PT Stop Time  0930    PT Time Calculation (min)  40 min    Activity Tolerance  Patient tolerated treatment well    Behavior During Therapy  WFL for tasks assessed/performed       Past Medical History:  Diagnosis Date  . Abnormal SPEP 04/17/2014  . Acute left ankle pain 01/26/2017  . ANEMIA-UNSPECIFIED 09/18/2009  . Anxiety   . Arthritis   . Chronic diastolic heart failure, NYHA class 2 (HCC)    Normal LVEDP by May 2018  . COPD (chronic obstructive pulmonary disease) (HCC)   . Depression   . DIABETES MELLITUS, TYPE II 08/21/2006  . Diabetic osteomyelitis (HCC) 05/29/2015  . Fracture of 5th metatarsal    non union  . GERD 08/21/2006  . GOUT 08/20/2010  . Hx of umbilical hernia repair   . HYPERLIPIDEMIA 08/21/2006  . HYPERTENSION 08/21/2006  . Infection of wound due to methicillin resistant Staphylococcus aureus (MRSA)   . Internal hemorrhoids   . Multiple allergies 10/14/2016  . OBESITY 06/04/2009  . Onychomycosis 10/27/2015  . Osteomyelitis of left foot (HCC) 05/29/2015  . Pulmonary sarcoidosis (HCC)    Followed locally by pulmonology, but also by Dr. Donahue at [facility]  . Right knee pain 01/26/2017  . Vocal cord dysfunction   . Wears partial dentures     Past Surgical History:  Procedure Laterality Date  . ABDOMINAL HYSTERECTOMY    . APPENDECTOMY    . BLADDER SUSPENSION  11/11/2011   Procedure: TRANSVAGINAL TAPE (TVT) PROCEDURE;  Surgeon: Mark E Anderson, MD;  Location: [facility];   Service: Gynecology;  Laterality: N/A;  . CAROTIDS  02/18/11   CAROTID DUPLEX; VERTEBRALS ARE PATENT WITH ANTEGRADE FLOW. ICA/CCA RATIO 1.61 ON RIGHT AND 0.75 ON LEFT  . CHOLECYSTECTOMY  1984  . CYSTOSCOPY  11/11/2011   Procedure: CYSTOSCOPY;  Surgeon: Mark E Anderson, MD;  Location: [facility];  Service: Gynecology;  Laterality: N/A;  . EXTUBATION (ENDOTRACHEAL) IN OR N/A 09/23/2017   Procedure: EXTUBATION (ENDOTRACHEAL) IN OR;  Surgeon: Marcellino, Amanda J, MD;  Location: [facility];  Service: ENT;  Laterality: N/A;  . FIBEROPTIC LARYNGOSCOPY AND TRACHEOSCOPY N/A 09/23/2017   Procedure: FLEXIBLE FIBEROPTIC LARYNGOSCOPY;  Surgeon: Marcellino, Amanda J, MD;  Location: [facility];  Service: ENT;  Laterality: N/A;  . FRACTURE SURGERY     foot  . HERNIA REPAIR    . I&D EXTREMITY Left 06/27/2015   Procedure: Partial Excision Left Calcaneus, Place Antibiotic Beads, and Wound VAC;  Surgeon: Marcus Duda V, MD;  Location: [facility];  Service: Orthopedics;  Laterality: Left;  . KNEE ARTHROSCOPY     right  . LEFT AND RIGHT HEART CATHETERIZATION WITH CORONARY ANGIOGRAM N/A 04/23/2013   Procedure: LEFT AND RIGHT HEART CATHETERIZATION WITH CORONARY ANGIOGRAM;  Surgeon: David W Harding, MD;  Location: [facility] CATH [facility];  Service: Cardiovascular;  Laterality: N/A;  . LEFT HEART CATH AND CORONARY ANGIOGRAPHY N/A 03/11/2017     Procedure: Left Heart Cath and Coronary Angiography;  Surgeon: Cooper, Michael, MD;  Location: [facility] INVASIVE CV LAB; angiographically minimal CAD in the LAD otherwise normal.;  Normal LVEDP.  FALSE POSITIVE MYOVIEW  . LEFT HEART CATH AND CORONARY ANGIOGRAPHY  07/20/2010   LVEF 50-55% WITH VERY MILD GLOBAL HYPOKINESIA; ESSENTIALLY NORMAL CORONARY ARTERIES; NORMAL LV FUNCTION  . METATARSAL OSTEOTOMY WITH OPEN REDUCTION INTERNAL FIXATION (ORIF) METATARSAL WITH FUSION Left 04/09/2014   Procedure: LEFT FOOT FRACTURE OPEN TREATMENT METATARSAL INCLUDES INTERNAL FIXATION EACH;  Surgeon: Robert A Wainer, MD;  Location: [facility];  Service: Orthopedics;  Laterality: Left;  . NISSEN FUNDOPLICATION  2004  . NM MYOVIEW LTD  03/09/2013   Lexiscan --> EF 50%; NORMAL MYOCARDIAL PERFUSION STUDY - breast attenuation  . NM MYOVIEW LTD  03/10/2017   : Moderate size "stress-induced "perfusion defect at the apex as well as "ill-defined stress-induced perfusion defect in the lateral wall.  EF 55%.  INTERMEDIATE risk. -->  FALSE POSITIVE  . Right and left CARDIAC CATHETERIZATION  04/23/2013   Angiographic normal coronaries; LVEDP 20 mmHg, PCWP 12-14 mmHg, RAP 12 mmHg.; Fick CO/CI 4.9/2.2  . TOTAL KNEE ARTHROPLASTY Right 06/29/2017   Procedure: RIGHT TOTAL KNEE ARTHROPLASTY;  Surgeon: Duda, Marcus V, MD;  [facility];  Service: Orthopedics;  Laterality: Right;  . TOTAL KNEE ARTHROPLASTY Left 12/07/2017  . TOTAL KNEE ARTHROPLASTY Left 12/07/2017   Procedure: LEFT TOTAL KNEE ARTHROPLASTY;  Surgeon: Duda, Marcus V, MD;  [facility];  Service: Orthopedics;  Laterality: Left;  . TRACHEOSTOMY TUBE PLACEMENT N/A 09/20/2017   Procedure: AWAKE INTUBATION WITH ANESTHESIA WITH VIDEO ASSISTANCE;  Surgeon: Marcellino, Amanda J, MD;  [facility];  Service: ENT;  Laterality: N/A;  . TRANSTHORACIC ECHOCARDIOGRAM  08/2014   Normal LV size and function.  Mild LVH.  EF 55-60%.  Normal regional wall motion.  GR 1 DD.  Normal RV size and function .  . TUBAL LIGATION     with reversal in 1994  . VENTRAL HERNIA REPAIR      There were no vitals filed for this visit.  Subjective Assessment - 08/23/18 0925    Subjective  Pt states that she has been pain free. She is not feeling well today, having cough and chest tightness, is going to see PCP today. No longer wearing heart monitor .    Currently in Pain?  No/denies    Pain Score  0-No pain                       [facility] Adult PT Treatment/Exercise - 08/23/18 0900      Exercises   Exercises  Neck      Neck Exercises: Theraband   Rows  20 reps;Red       Neck Exercises: Standing   Other Standing Exercises  AROM: shoulder abd to 90 deg x15;  Scaption x15;        Neck Exercises: Seated   Neck Retraction  20 reps    Cervical Rotation  10 reps    Shoulder Rolls  20 reps    Other Seated Exercise  shoulder pulley x2 min flexion;       Neck Exercises: Supine   Neck Retraction  --    Shoulder Flexion  20 reps    Shoulder Flexion Weights (lbs)  --    Shoulder Flexion Limitations  x15 with cane,  x15 without;     Other Supine Exercise  --        Acupuncturist Location  Rt neck/shoulder      Manual Therapy   Manual Therapy  Soft tissue mobilization;Passive ROM;Manual Traction    Soft tissue mobilization  STM to R UT and paraspinals;     Passive ROM  Manual UT stretching bil;        Neck Exercises: Stretches   Upper Trapezius Stretch  30 seconds;3 reps;Right;Left             PT Education - 08/23/18 0926    Education Details  Education on final HEP.     Person(s) Educated  Patient    Methods  Explanation    Comprehension  Verbalized understanding          PT Long Term Goals - 08/23/18 0927      PT LONG TERM GOAL #1   Title  independent with HEP    Status  Achieved      PT LONG TERM GOAL #2   Title  improve cervical ROM to [facility] without increase in pain    Status  Achieved      PT LONG TERM GOAL #3   Title  report ability to perform upper body dressing without increase in pain for improved function    Status  Achieved            Plan - 08/23/18 0928    Clinical Impression Statement  Pt has met goals and is ready for d/c to HEP. She reports no pain since last visit. She has full ROM without pain. Final HEP reviewed today. Pt with difficulty achieving scapular retraction with t-band, suggested doing it without if she is getting compensation or tightness in UTs. Pt will be discharged at this time.     Rehab Potential  Good    PT Frequency  2x / week    PT Duration  6 weeks    PT  Treatment/Interventions  ADLs/Self Care Home Management;Cryotherapy;Electrical Stimulation;Iontophoresis 65m/ml Dexamethasone;Moist Heat;Traction;Therapeutic exercise;Therapeutic activities;Functional mobility training;Ultrasound;Patient/family education;Manual techniques;Taping;Dry needling    PT Next Visit Plan  review HEP, DN/manual/modalities PRN, continue postural strengthening/stretching    PT Home Exercise Plan  Access Code: [email or web address]     Consulted and Agree with Plan of Care  Patient       Patient will benefit from skilled therapeutic intervention in order to improve the following deficits and impairments:  Hypomobility, Increased fascial restricitons, Increased muscle spasms, Decreased strength, Decreased range of motion, Postural dysfunction, Pain  Visit Diagnosis: Cervicalgia  Cramp and spasm  Abnormal posture  Neck pain on right side     Problem List Patient Active Problem List   Diagnosis Date Noted  . Subcortical microvascular ischemic occlusive disease 07/12/2018  . Rapid palpitations 07/12/2018  . Impingement of left ankle joint 06/26/2018  . Constipation 04/21/2018  . Anxiety 04/05/2018  . Total knee replacement status, left 12/07/2017  . Unilateral primary osteoarthritis, left knee 11/07/2017  . Dysphagia 09/20/2017  . Epiglottitis   . Plantar fasciitis, right 07/13/2017  . S/P total knee arthroplasty, right 06/29/2017  . Osteoarthrosis, localized, primary, knee, right   . Sprain of calcaneofibular ligament of right ankle 06/06/2017  . Osteoarthritis of right knee 01/26/2017  . Multiple allergies 10/14/2016  . Onychomycosis 10/27/2015  . CKD (chronic kidney disease) stage 3, GFR 30-59 ml/min (HCC) 04/27/2015  . MRSA (methicillin resistant staph aureus) culture positive 03/27/2015  . Wound infection complicating hardware ([place] 03/27/2015  . Fatty liver 09/30/2014  . Hot flashes 07/15/2014  . Abnormal  SPEP 04/17/2014  . Fracture of left leg 04/17/2014  .  Cushingoid side effect of steroids ([place]) 04/17/2014  . Internal hemorrhoids   . Depression   . Preoperative clearance 03/25/2014  . Obstructive chronic bronchitis without exacerbation ([place]) 09/18/2013  . Chest pain 04/11/2013  . Hypertensive heart disease with chronic diastolic congestive heart failure ([place])   . Solitary pulmonary nodule, on CT 02/2013 - stable over 2 years in 2015 02/20/2013  . Gout 08/20/2010  . Anemia 09/18/2009  . Morbid obesity ([place]) 06/04/2009  . Sleep apnea 04/21/2009  . Sarcoidosis of lung ([place]) 04/10/2007  . Hyperlipidemia 08/21/2006  . Essential hypertension 08/21/2006  . GERD 08/21/2006  . Type II diabetes mellitus with renal manifestations ([place]) 08/21/2006     Lyndee Hensen, PT, DPT 10:01 AM  08/23/18   [facility] [place], [name], [address] Phone: [phone]   Fax:  [phone]  Name: [name] MRN: [id number] Date of Birth: 09/20/1961      PHYSICAL THERAPY DISCHARGE SUMMARY  Visits from Start of Care: 5   Plan: Patient agrees to discharge.  Patient goals were met. Patient is being discharged due to meeting the stated rehab goals.  ?????     Lyndee Hensen, PT, DPT 10:02 AM  08/23/18

## 2018-08-23 NOTE — Telephone Encounter (Signed)
PRIMARY CARDIOLOGIST -- [facility] Pre-operative Risk Assessment    Request for surgical clearance:  1. What type of surgery is being performed? HALLUX IPJ FUSION AND HAMMER TOE REPAIR 2,3,4 DIGITS OF THE LEFT FOOT  2. When is this surgery scheduled? NOV 21,2019  3. What type of clearance is required (medical clearance vs. Pharmacy clearance to hold med vs. Both)? MEDICAL  4. Are there any medications that need to be held prior to surgery and how long? N/A  5. Practice name and name of physician performing surgery?  [facility] ; DR  M. TODD HYATT  6. What is your office phone number [phone]   7.   What is your office fax number [phone]  8.   Anesthesia type (None, local, MAC, general) ? GENERAL   [name] 08/23/2018, 4:39 PM  _________________________________________________________________   (provider comments below)

## 2018-08-23 NOTE — Progress Notes (Signed)
Subjective:  [name] is a 56 y.o. year old very pleasant female patient who presents for/with See problem oriented charting ROS- no fever, chills, nausea, vomiting. Does have continued cough and chest congestion. Denies sinus pressure.    Past Medical History-  Patient Active Problem List   Diagnosis Date Noted  . Constipation 04/21/2018    Priority: High  . Obstructive chronic bronchitis without exacerbation ([place]) 09/18/2013    Priority: High  . Chest pain 04/11/2013    Priority: High  . Hypertensive heart disease with chronic diastolic congestive heart failure ([place])     Priority: High  . Sarcoidosis of lung ([place]) 04/10/2007    Priority: High  . Type II diabetes mellitus with renal manifestations ([place]) 08/21/2006    Priority: High  . Epiglottitis     Priority: Medium  . Osteoarthritis of right knee 01/26/2017    Priority: Medium  . CKD (chronic kidney disease) stage 3, GFR 30-59 ml/min (HCC) 04/27/2015    Priority: Medium  . Fatty liver 09/30/2014    Priority: Medium  . Depression     Priority: Medium  . Gout 08/20/2010    Priority: Medium  . Anemia 09/18/2009    Priority: Medium  . Sleep apnea 04/21/2009    Priority: Medium  . Hyperlipidemia 08/21/2006    Priority: Medium  . Essential hypertension 08/21/2006    Priority: Medium  . Dysphagia 09/20/2017    Priority: Low  . Plantar fasciitis, right 07/13/2017    Priority: Low  . Osteoarthrosis, localized, primary, knee, right     Priority: Low  . Sprain of calcaneofibular ligament of right ankle 06/06/2017    Priority: Low  . Onychomycosis 10/27/2015    Priority: Low  . MRSA (methicillin resistant staph aureus) culture positive 03/27/2015    Priority: Low  . Hot flashes 07/15/2014    Priority: Low  . Abnormal SPEP 04/17/2014    Priority: Low  . Fracture of left leg 04/17/2014    Priority: Low  . Cushingoid side effect of steroids ([place]) 04/17/2014    Priority: Low  . Internal hemorrhoids      Priority: Low  . Preoperative clearance 03/25/2014    Priority: Low  . Solitary pulmonary nodule, on CT 02/2013 - stable over 2 years in 2015 02/20/2013    Priority: Low  . Morbid obesity ([place]) 06/04/2009    Priority: Low  . GERD 08/21/2006    Priority: Low  . Subcortical microvascular ischemic occlusive disease 07/12/2018  . Rapid palpitations 07/12/2018  . Impingement of left ankle joint 06/26/2018  . Anxiety 04/05/2018  . Total knee replacement status, left 12/07/2017  . Unilateral primary osteoarthritis, left knee 11/07/2017  . S/P total knee arthroplasty, right 06/29/2017  . Multiple allergies 10/14/2016  . Wound infection complicating hardware ([place]) 03/27/2015    Medications- reviewed and updated Current Outpatient Medications  Medication Sig Dispense Refill  . albuterol (PROVENTIL HFA;VENTOLIN HFA) 108 (90 Base) MCG/ACT inhaler Inhale 1-2 puffs into the lungs every 6 (six) hours as needed for wheezing or shortness of breath. 8 g 5  . allopurinol (ZYLOPRIM) 100 MG tablet Take 100 mg by mouth 2 (two) times daily.     [name] aspirin EC 81 MG tablet Take 81 mg by mouth daily.     [name] atorvastatin (LIPITOR) 40 MG tablet TAKE 1 TAB BY MOUTH ONCE DAILY 90 tablet 3  . BAYER MICROLET LANCETS lancets Use as instructed to check sugar 3 times daily 300 each 5  . buPROPion (  WELLBUTRIN XL) 300 MG 24 hr tablet Take 1 tablet (300 mg total) by mouth daily. 90 tablet 3  . Calcium Citrate 200 MG TABS Take 200 mg by mouth daily.    . carvedilol (COREG) 25 MG tablet TAKE 1 TABLET (25 MG TOTAL) BY MOUTH 2 (TWO) TIMES DAILY WITH A MEAL. 180 tablet 1  . Cholecalciferol (VITAMIN D) 2000 units tablet Take 2,000 Units by mouth daily.    . Cyanocobalamin (VITAMIN B-12 PO) Take 2,500 mcg by mouth daily.     . cyclobenzaprine (FLEXERIL) 5 MG tablet Take 1 tablet at bedtime x 3 days, then increase to 1 tablet twice daily 60 tablet 1  . DEXILANT 60 MG capsule TAKE 1 CAPSULE BY MOUTH EVERY DAY 30 capsule 0  .  dicyclomine (BENTYL) 20 MG tablet Take one tablet 15 minutes before meals 90 tablet 1  . diltiazem (CARDIZEM CD) 120 MG 24 hr capsule TAKE 1 CAPSULE BY MOUTH EVERY DAY (FOR FUTURE REFILLS MAKE A DOCTORS VISIT) 90 capsule 3  . fenofibrate (TRICOR) 145 MG tablet TAKE 1 TABLET BY MOUTH EVERY DAY 30 tablet 11  . fluticasone (FLONASE) 50 MCG/ACT nasal spray Place 2 sprays into both nostrils daily as needed for allergies. 16 g 3  . fluticasone furoate-vilanterol (BREO ELLIPTA) 200-25 MCG/INH AEPB Inhale 1 puff into the lungs daily.    . furosemide (LASIX) 40 MG tablet TAKE 1 TABLET BY MOUTH TWICE A DAY 180 tablet 3  . gabapentin (NEURONTIN) 100 MG capsule TAKE 1 CAPSULE BY MOUTH THREE TIMES A DAY 270 capsule 0  . glucose blood (BAYER CONTOUR TEST) test strip Use as instructed to check sugar 3 times daily 300 each 5  . insulin aspart (NOVOLOG FLEXPEN) 100 UNIT/ML FlexPen Inject 35-40 Units into the skin 3 (three) times daily with meals. 15 pen 5  . Insulin Glargine (TOUJEO SOLOSTAR) 300 UNIT/ML SOPN Inject 40 Units into the skin at bedtime. 5 pen 11  . Insulin Pen Needle 33G X 4 MM MISC 1 each by Does not apply route 4 (four) times daily. 200 each 11  . ipratropium (ATROVENT) 0.06 % nasal spray Place 2 sprays into both nostrils 4 (four) times daily. 15 mL 0  . isosorbide mononitrate (IMDUR) 30 MG 24 hr tablet Take 1 tablet (30 mg total) by mouth at bedtime. 90 tablet 3  . KLOR-CON M20 20 MEQ tablet TAKE 1 & 1/2 TABLETS BY MOUTH TWICE A DAY  2  . linaclotide (LINZESS) 290 MCG CAPS capsule Take 1 capsule (290 mcg total) by mouth daily before breakfast. 30 capsule 11  . LORazepam (ATIVAN) 0.5 MG tablet Take 0.5 mg by mouth every 12 (twelve) hours as needed for anxiety.    . metFORMIN (GLUCOPHAGE) 1000 MG tablet TAKE 1 TABLET BY MOUTH TWICE A DAY WITH A MEAL 180 tablet 0  . methylPREDNISolone (MEDROL DOSEPAK) 4 MG TBPK tablet 6-5-4-3-2-1-off 21 tablet 0  . NEOMYCIN-POLYMYXIN-HYDROCORTISONE (CORTISPORIN) 1 %  SOLN OTIC solution Apply 1-2 drops to toe BID after soaking 10 mL 1  . nitroGLYCERIN (NITROSTAT) 0.4 MG SL tablet Place 1 tablet (0.4 mg total) under the tongue every 5 (five) minutes as needed for chest pain. Reported on 01/05/2016 25 tablet 6  . ondansetron (ZOFRAN-ODT) 4 MG disintegrating tablet Take 1 tablet (4 mg total) by mouth every 8 (eight) hours as needed for nausea or vomiting. 20 tablet 1  . oxyCODONE-acetaminophen (PERCOCET/ROXICET) 5-325 MG tablet Take 1 tablet by mouth every 4 (four) hours as needed  for severe pain. 8 tablet 0  . PARoxetine Mesylate (BRISDELLE) 7.5 MG CAPS Take 7.5 mg by mouth daily.    . Potassium Chloride ER 20 MEQ TBCR TAKE ONE AND ONE-HALF TABLETS BY MOUTH 2 TIMES DAILY 270 tablet 2  . predniSONE (DELTASONE) 5 MG tablet Take 5 mg by mouth daily with breakfast.    . predniSONE (DELTASONE) 50 MG tablet Take 1 tablet daily 5 tablet 0  . senna (SENOKOT) 8.6 MG TABS tablet Take 1 tablet (8.6 mg total) by mouth daily as needed for mild constipation or moderate constipation. 120 each 0  . telmisartan (MICARDIS) 20 MG tablet TAKE 1 TABLET BY MOUTH EVERY DAY 90 tablet 2  . triamcinolone cream (KENALOG) 0.1 % Apply 1 application topically 2 (two) times daily as needed (for rash. stop after 7-10 days.). 80 g 0  . venlafaxine XR (EFFEXOR-XR) 75 MG 24 hr capsule Take 1 capsule (75 mg total) by mouth 2 (two) times daily.    . Vitamin D-Vitamin K (K2 PLUS D3) [phone] MCG-UNIT TABS cholecalciferol (vit D3) 1,000 unit-vitamin K2 (MK4) 100 mcg tablet  Take by oral route.     No current facility-administered medications for this visit.     Objective: BP (!) 132/50 (BP Location: Left Arm, Patient Position: Sitting, Cuff Size: Large)   Pulse 76   Temp 97.8 F (36.6 C) (Oral)   Ht 5\' 6"  (1.676 m)   Wt 254 lb 6.4 oz (115.4 kg)   SpO2 94%   BMI 41.06 kg/m  Gen: appears fatigued, some tachypnea Nasal turbinates erythematous but no discharge. Pharynx with mild erythema. No  sinus tenderness or lymphadenopathy CV: RRR no murmurs rubs or gallops Lungs: occasional diffuse wheeze-  no crackles, wheeze, rhonchi Abdomen: soft/nontender/obese Ext: trace edema Skin: warm, dry  Assessment/Plan:  Sarcoidosis/obstructive chronic bronchitis Cough/wheeze S: saw pulmonary yesterday at [facility]. They gave her a boost of prednisone. Patient comes in today because she is concerned about her lack of improvement...  I saw her 2 weeks ago and we put her on prednisone as well as augmentin and she states has seen no improvement- feels tight in chest, wheezing, coughing- still getting relief with albuterol inhaler or nebulizer.  A/P: over 2 weeks of cough, wheeze, chest congestion that responds well to albuterol but overall just isnt improving.  Will get chest x-ray to rule out pneumonia-my initial read does not show pneumonia-suspicious for bronchitis.  Prior round of prednisone/augmentin and now on another round or prednisone from [facility] pulmonary. Patient has ended up hospitalized on several occasiosns due to respiratory illness and wants to be more proactive. I told her with under 24 hours of prednisone in system-we needed to give this some more time.  I did write her prescription for doxycycline which would give more atypical coverage-she states in 2017 when she took that she did not have a rash as she did in 2016 so wants to retrial.  I asked her to try to wait at least 24 to 48 hours though she can start this if symptoms worsen.  She does not respond to this within 24 to 48 hours should return to care.  I would prefer to avoid quinolones if possible  Future Appointments  Date Time Provider [place]  08/25/2018  8:30 AM Renato Shin, MD [facility] None  09/13/2018 10:20 AM Leonie Man, MD [facility]   Lab/Order associations: Cough - Plan: DG Chest 2 View  Chest congestion - Plan: DG Chest 2 View  Obstructive  chronic bronchitis without exacerbation  ([place])  Sarcoidosis of lung ([place])  Meds ordered this encounter  Medications  . doxycycline (VIBRA-TABS) 100 MG tablet    Sig: Take 1 tablet (100 mg total) by mouth 2 (two) times daily for 7 days. Rash 2016 on this. Tolerated 2017 trial.    Dispense:  14 tablet    Refill:  0    Return precautions advised.  Garret Reddish, MD

## 2018-08-23 NOTE — Telephone Encounter (Signed)
I called and informed the patient that Dr. Milinda Pointer and Dr. Amalia Hailey, both,stated they don't recommend surgery at this time due to her health issues.  I informed her that the surgery would be too risky.  I told her that Dr. Milinda Pointer could treat her conservatively if she needed continued care.  Patient stated she understands.

## 2018-08-23 NOTE — Patient Instructions (Signed)
I dont see an obvious pneumonia- this could be bronchitis. 95% of these cases are viral but with your history of getting very sick we opted to trial doxycycline 100mg  twice a day if symptoms worsen or are not improving within 24-48 hours from now.

## 2018-08-23 NOTE — Progress Notes (Signed)
Twinrix 1 mL given IM, L deltoid, mfg: [employer], lot #: [id number], exp: 02/05/2020, ndc: 31594-585-92, pt tolerated well.

## 2018-08-24 ENCOUNTER — Ambulatory Visit: Payer: BLUE CROSS/BLUE SHIELD | Admitting: Family Medicine

## 2018-08-24 NOTE — Telephone Encounter (Signed)
Thank you :)

## 2018-08-24 NOTE — Telephone Encounter (Signed)
Dr. Ellyn Hack, p thas been wearing an event monitor for palpitations and now need surgery on Lt foot with general anesthesia.  Please give you opinion on surgery once monitor results are back.  Thanks.  Please send to CV pre-op pool

## 2018-08-25 ENCOUNTER — Encounter: Payer: Self-pay | Admitting: Endocrinology

## 2018-08-25 ENCOUNTER — Ambulatory Visit (INDEPENDENT_AMBULATORY_CARE_PROVIDER_SITE_OTHER): Payer: BLUE CROSS/BLUE SHIELD | Admitting: Endocrinology

## 2018-08-25 VITALS — BP 124/60 | HR 77 | Ht 66.0 in | Wt 253.6 lb

## 2018-08-25 DIAGNOSIS — Z794 Long term (current) use of insulin: Secondary | ICD-10-CM

## 2018-08-25 DIAGNOSIS — E1129 Type 2 diabetes mellitus with other diabetic kidney complication: Secondary | ICD-10-CM

## 2018-08-25 LAB — POCT GLYCOSYLATED HEMOGLOBIN (HGB A1C): Hemoglobin A1C: 8.2 % — AB (ref 4.0–5.6)

## 2018-08-25 MED ORDER — INSULIN ASPART 100 UNIT/ML FLEXPEN: 40.0000 [IU] | PEN_INJECTOR | Freq: Three times a day (TID) | SUBCUTANEOUS | 5 refills | Status: DC

## 2018-08-25 MED ORDER — INSULIN GLARGINE (1 UNIT DIAL) 300 UNIT/ML ~~LOC~~ SOPN: 25.0000 [IU] | PEN_INJECTOR | Freq: Every day | SUBCUTANEOUS | Status: DC

## 2018-08-25 NOTE — Progress Notes (Signed)
Subjective:    Patient ID: [name], female    DOB: Dec 05, 1960, 57 y.o.   MRN: [id number]  HPI Pt returns for f/u of diabetes mellitus: DM type: Insulin-requiring type 2 Dx'ed: 5329 Complications: polyneuropathy, renal insuff, and mild CAD Therapy: insulin since 2006, and 2 oral meds.  GDM: never DKA: never Severe hypoglycemia: never Pancreatitis: never Pancreatic imaging: CT (2015) showed fatty atrophy.  Other: she takes multiple daily injections; she intermittently takes prednisone for sarcoidosis.  Interval history:  no cbg record, but states cbg's vary from 100-200's. It is in general higher as the day goes on.  She is on a prednisone taper, for AB.  She says this increased cbg's to the 200's.  She seldom has hypoglycemia, and these are mild. The happens fasting Past Medical History:  Diagnosis Date  . Abnormal SPEP 04/17/2014  . Acute left ankle pain 01/26/2017  . ANEMIA-UNSPECIFIED 09/18/2009  . Anxiety   . Arthritis   . Chronic diastolic heart failure, NYHA class 2 (HCC)    Normal LVEDP by May 2018  . COPD (chronic obstructive pulmonary disease) ([place])   . Depression   . DIABETES MELLITUS, TYPE II 08/21/2006  . Diabetic osteomyelitis ([place]) 05/29/2015  . Fracture of 5th metatarsal    non union  . GERD 08/21/2006  . GOUT 08/20/2010  . Hx of umbilical hernia repair   . HYPERLIPIDEMIA 08/21/2006  . HYPERTENSION 08/21/2006  . Infection of wound due to methicillin resistant Staphylococcus aureus (MRSA)   . Internal hemorrhoids   . Multiple allergies 10/14/2016  . OBESITY 06/04/2009  . Onychomycosis 10/27/2015  . Osteomyelitis of left foot ([place]) 05/29/2015  . Pulmonary sarcoidosis ([place])    Followed locally by pulmonology, but also by Dr. Casper Harrison at [facility]  . Right knee pain 01/26/2017  . Vocal cord dysfunction   . Wears partial dentures     Past Surgical History:  Procedure Laterality Date  . ABDOMINAL HYSTERECTOMY    . APPENDECTOMY    . BLADDER  SUSPENSION  11/11/2011   Procedure: TRANSVAGINAL TAPE (TVT) PROCEDURE;  Surgeon: Olga Millers, MD;  Location: [facility];  Service: Gynecology;  Laterality: N/A;  . CAROTIDS  02/18/11   CAROTID DUPLEX; VERTEBRALS ARE PATENT WITH ANTEGRADE FLOW. ICA/CCA RATIO 1.61 ON RIGHT AND 0.75 ON LEFT  . CHOLECYSTECTOMY  1984  . CYSTOSCOPY  11/11/2011   Procedure: CYSTOSCOPY;  Surgeon: Olga Millers, MD;  Location: [facility];  Service: Gynecology;  Laterality: N/A;  . EXTUBATION (ENDOTRACHEAL) IN OR N/A 09/23/2017   Procedure: EXTUBATION (ENDOTRACHEAL) IN OR;  Surgeon: Helayne Seminole, MD;  Location: [facility];  Service: ENT;  Laterality: N/A;  . FIBEROPTIC LARYNGOSCOPY AND TRACHEOSCOPY N/A 09/23/2017   Procedure: FLEXIBLE FIBEROPTIC LARYNGOSCOPY;  Surgeon: Helayne Seminole, MD;  Location: [facility];  Service: ENT;  Laterality: N/A;  . FRACTURE SURGERY     foot  . HERNIA REPAIR    . I&D EXTREMITY Left 06/27/2015   Procedure: Partial Excision Left Calcaneus, Place Antibiotic Beads, and Wound VAC;  Surgeon: Newt Minion, MD;  Location: [facility];  Service: Orthopedics;  Laterality: Left;  . KNEE ARTHROSCOPY     right  . LEFT AND RIGHT HEART CATHETERIZATION WITH CORONARY ANGIOGRAM N/A 04/23/2013   Procedure: LEFT AND RIGHT HEART CATHETERIZATION WITH CORONARY ANGIOGRAM;  Surgeon: Leonie Man, MD;  Location: [facility];  Service: Cardiovascular;  Laterality: N/A;  . LEFT HEART CATH AND CORONARY ANGIOGRAPHY N/A 03/11/2017  Procedure: Left Heart Cath and Coronary Angiography;  Surgeon: Sherren Mocha, MD;  Location: [facility] CV LAB; angiographically minimal CAD in the LAD otherwise normal.;  Normal LVEDP.  FALSE POSITIVE MYOVIEW  . LEFT HEART CATH AND CORONARY ANGIOGRAPHY  07/20/2010   LVEF 50-55% WITH VERY MILD GLOBAL HYPOKINESIA; ESSENTIALLY NORMAL CORONARY ARTERIES; NORMAL LV FUNCTION  . METATARSAL OSTEOTOMY WITH OPEN REDUCTION INTERNAL FIXATION (ORIF) METATARSAL WITH FUSION Left 04/09/2014   Procedure: LEFT  FOOT FRACTURE OPEN TREATMENT METATARSAL INCLUDES INTERNAL FIXATION EACH;  Surgeon: Lorn Junes, MD;  Location: [facility];  Service: Orthopedics;  Laterality: Left;  . NISSEN FUNDOPLICATION  2355  . NM MYOVIEW LTD  03/09/2013   Lexiscan --> EF 50%; NORMAL MYOCARDIAL PERFUSION STUDY - breast attenuation  . NM MYOVIEW LTD  03/10/2017   : Moderate size "stress-induced "perfusion defect at the apex as well as "ill-defined stress-induced perfusion defect in the lateral wall.  EF 55%.  INTERMEDIATE risk. -->  FALSE POSITIVE  . Right and left CARDIAC CATHETERIZATION  04/23/2013   Angiographic normal coronaries; LVEDP 20 mmHg, PCWP 12-14 mmHg, RAP 12 mmHg.; Fick CO/CI 4.9/2.2  . TOTAL KNEE ARTHROPLASTY Right 06/29/2017   Procedure: RIGHT TOTAL KNEE ARTHROPLASTY;  Surgeon: Newt Minion, MD;  Location: [facility];  Service: Orthopedics;  Laterality: Right;  . TOTAL KNEE ARTHROPLASTY Left 12/07/2017  . TOTAL KNEE ARTHROPLASTY Left 12/07/2017   Procedure: LEFT TOTAL KNEE ARTHROPLASTY;  Surgeon: Newt Minion, MD;  Location: [facility];  Service: Orthopedics;  Laterality: Left;  . TRACHEOSTOMY TUBE PLACEMENT N/A 09/20/2017   Procedure: AWAKE INTUBATION WITH ANESTHESIA WITH VIDEO ASSISTANCE;  Surgeon: Helayne Seminole, MD;  Location: [facility];  Service: ENT;  Laterality: N/A;  . TRANSTHORACIC ECHOCARDIOGRAM  08/2014   Normal LV size and function.  Mild LVH.  EF 55-60%.  Normal regional wall motion.  GR 1 DD.  Normal RV size and function .  [name] TUBAL LIGATION     with reversal in 1994  . VENTRAL HERNIA REPAIR      Social History   Socioeconomic History  . Marital status: Married    Spouse name: LAKEITA PANTHER  . Number of children: 2  . Years of education: 31  . Highest education level: Not on file  Occupational History  . Occupation: DISABLED  Social Needs  . Financial resource strain: Not on file  . Food insecurity:    Worry: Not on file    Inability: Not on file  . Transportation  needs:    Medical: Not on file    Non-medical: Not on file  Tobacco Use  . Smoking status: Never Smoker  . Smokeless tobacco: Never Used  Substance and Sexual Activity  . Alcohol use: No    Alcohol/week: 0.0 standard drinks  . Drug use: No  . Sexual activity: Yes    Birth control/protection: Surgical  Lifestyle  . Physical activity:    Days per week: Not on file    Minutes per session: Not on file  . Stress: Not on file  Relationships  . Social connections:    Talks on phone: Not on file    Gets together: Not on file    Attends religious service: Not on file    Active member of club or organization: Not on file    Attends meetings of clubs or organizations: Not on file    Relationship status: Not on file  . Intimate partner violence:    Fear of current  or ex partner: Not on file    Emotionally abused: Not on file    Physically abused: Not on file    Forced sexual activity: Not on file  Other Topics Concern  . Not on file  Social History Narrative   Married 1994. 2 sons who both live close and 1 grandson.       Disability due to sarcoidosis. 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 albuterol (PROVENTIL HFA;VENTOLIN HFA) 108 (90 Base) MCG/ACT inhaler Inhale 1-2 puffs into the lungs every 6 (six) hours as needed for wheezing or shortness of breath. 8 g 5  . allopurinol (ZYLOPRIM) 100 MG tablet Take 100 mg by mouth 2 (two) times daily.     [name] aspirin EC 81 MG tablet Take 81 mg by mouth daily.     [name] atorvastatin (LIPITOR) 40 MG tablet TAKE 1 TAB BY MOUTH ONCE DAILY 90 tablet 3  . BAYER MICROLET LANCETS lancets Use as instructed to check sugar 3 times daily 300 each 5  . buPROPion (WELLBUTRIN XL) 300 MG 24 hr tablet Take 1 tablet (300 mg total) by  mouth daily. 90 tablet 3  . Calcium Citrate 200 MG TABS Take 200 mg by mouth daily.    . carvedilol (COREG) 25 MG tablet TAKE 1 TABLET (25 MG TOTAL) BY MOUTH 2 (TWO) TIMES DAILY WITH A MEAL. 180 tablet 1  . Cholecalciferol (VITAMIN D) 2000 units tablet Take 2,000 Units by mouth daily.    . Cyanocobalamin (VITAMIN B-12 PO) Take 2,500 mcg by mouth daily.     . cyclobenzaprine (FLEXERIL) 5 MG tablet Take 1 tablet at bedtime x 3 days, then increase to 1 tablet twice daily 60 tablet 1  . DEXILANT 60 MG capsule TAKE 1 CAPSULE BY MOUTH EVERY DAY 30 capsule 0  . dicyclomine (BENTYL) 20 MG tablet Take one tablet 15 minutes before meals 90 tablet 1  . diltiazem (CARDIZEM CD) 120 MG 24 hr capsule TAKE 1 CAPSULE BY MOUTH EVERY DAY (FOR FUTURE REFILLS MAKE A DOCTORS VISIT) 90 capsule 3  . doxycycline (VIBRA-TABS) 100 MG tablet Take 1 tablet (100 mg total) by mouth 2 (two) times daily for 7 days. Rash 2016 on this. Tolerated 2017 trial. 14 tablet 0  . fenofibrate (TRICOR) 145 MG tablet TAKE 1 TABLET BY MOUTH EVERY DAY 30 tablet 11  . fluticasone (FLONASE) 50 MCG/ACT nasal spray Place 2 sprays into both nostrils daily as needed for allergies. 16 g 3  . fluticasone furoate-vilanterol (BREO ELLIPTA) 200-25 MCG/INH AEPB Inhale 1 puff into the lungs daily.    . furosemide (LASIX) 40 MG tablet TAKE 1 TABLET BY MOUTH TWICE A DAY 180 tablet 3  . gabapentin (NEURONTIN) 100 MG capsule TAKE 1 CAPSULE BY MOUTH THREE TIMES A DAY 270 capsule 0  . glucose blood (BAYER CONTOUR TEST) test strip Use as instructed to check sugar 3 times daily 300 each 5  . Insulin Pen Needle 33G X 4 MM MISC 1 each by Does not apply route 4 (four) times daily. 200 each 11  . ipratropium (ATROVENT) 0.06 % nasal spray Place 2 sprays into both nostrils 4 (four) times daily. 15 mL 0  . isosorbide mononitrate (IMDUR) 30 MG 24 hr tablet Take  1 tablet (30 mg total) by mouth at bedtime. 90 tablet 3  . KLOR-CON M20 20 MEQ tablet TAKE 1 & 1/2 TABLETS BY  MOUTH TWICE A DAY  2  . linaclotide (LINZESS) 290 MCG CAPS capsule Take 1 capsule (290 mcg total) by mouth daily before breakfast. 30 capsule 11  . LORazepam (ATIVAN) 0.5 MG tablet Take 0.5 mg by mouth every 12 (twelve) hours as needed for anxiety.    . metFORMIN (GLUCOPHAGE) 1000 MG tablet TAKE 1 TABLET BY MOUTH TWICE A DAY WITH A MEAL 180 tablet 0  . methylPREDNISolone (MEDROL DOSEPAK) 4 MG TBPK tablet 6-5-4-3-2-1-off 21 tablet 0  . nitroGLYCERIN (NITROSTAT) 0.4 MG SL tablet Place 1 tablet (0.4 mg total) under the tongue every 5 (five) minutes as needed for chest pain. Reported on 01/05/2016 25 tablet 6  . ondansetron (ZOFRAN-ODT) 4 MG disintegrating tablet Take 1 tablet (4 mg total) by mouth every 8 (eight) hours as needed for nausea or vomiting. 20 tablet 1  . oxyCODONE-acetaminophen (PERCOCET/ROXICET) 5-325 MG tablet Take 1 tablet by mouth every 4 (four) hours as needed for severe pain. 8 tablet 0  . PARoxetine Mesylate (BRISDELLE) 7.5 MG CAPS Take 7.5 mg by mouth daily.    . Potassium Chloride ER 20 MEQ TBCR TAKE ONE AND ONE-HALF TABLETS BY MOUTH 2 TIMES DAILY 270 tablet 2  . predniSONE (DELTASONE) 50 MG tablet Take 1 tablet daily 5 tablet 0  . senna (SENOKOT) 8.6 MG TABS tablet Take 1 tablet (8.6 mg total) by mouth daily as needed for mild constipation or moderate constipation. 120 each 0  . telmisartan (MICARDIS) 20 MG tablet TAKE 1 TABLET BY MOUTH EVERY DAY 90 tablet 2  . triamcinolone cream (KENALOG) 0.1 % Apply 1 application topically 2 (two) times daily as needed (for rash. stop after 7-10 days.). 80 g 0  . venlafaxine XR (EFFEXOR-XR) 75 MG 24 hr capsule Take 1 capsule (75 mg total) by mouth 2 (two) times daily.    . Vitamin D-Vitamin K (K2 PLUS D3) 2517042691 MCG-UNIT TABS cholecalciferol (vit D3) 1,000 unit-vitamin K2 (MK4) 100 mcg tablet  Take by oral route.    . predniSONE (DELTASONE) 5 MG tablet Take 5 mg by mouth daily with breakfast.    . [DISCONTINUED] mupirocin nasal ointment  (BACTROBAN) 2 % Place 1 application into the nose 2 (two) times daily. Use one-half of tube in each nostril twice daily for five (5) days. After application, press sides of nose together and gently massage. 10 g 0   No current facility-administered medications on file prior to visit.     Allergies  Allergen Reactions  . Methotrexate Other (See Comments)    Peri-oral and buccal lesions  . Vancomycin Other (See Comments)    DOSE RELATED NEPHROTOXICITY  . Lisinopril Cough  . Chlorhexidine Itching  . Clindamycin/Lincomycin Nausea And Vomiting and Rash    RASH > ALLERGY INTOLERANCE > NAUSEA & VOMITING  . Doxycycline Rash  . Teflaro [Ceftaroline] Rash    Tolerates ceftriaxone     Family History  Problem Relation Age of Onset  . Diabetes Father   . Heart attack Father 65  . Coronary artery disease Father   . Heart failure Father   . Throat cancer Father   . COPD Mother   . Emphysema Mother   . Asthma Mother   . Heart failure Mother   . Breast cancer Mother   . Heart attack Maternal Grandfather   . Sarcoidosis Maternal Uncle   .  Lung cancer Brother   . Diabetes Brother   . Colon cancer Neg Hx   . Rectal cancer Neg Hx     BP 124/60 (BP Location: Left Arm, Patient Position: Sitting, Cuff Size: Large)   Pulse 77   Ht 5\' 6"  (1.676 m)   Wt 253 lb 9.6 oz (115 kg)   SpO2 95%   BMI 40.93 kg/m   Review of Systems Denies LOC    Objective:   Physical Exam VITAL SIGNS:  See vs page GENERAL: no distress Pulses: dorsalis pedis intact bilat.   MSK: no deformity of the feet CV: trace bilat leg edema Skin:  no ulcer on the feet.  normal color and temp on the feet. Neuro: sensation is intact to touch on the feet, but decreased from normal Ext: There is bilateral onychomycosis of the toenails   Lab Results  Component Value Date   HGBA1C 8.2 (A) 08/25/2018   Lab Results  Component Value Date   CREATININE 1.10 (H) 07/26/2018   BUN 24 (H) 07/26/2018   NA 134 (L) 07/26/2018     K 4.0 07/26/2018   CL 95 (L) 07/26/2018   CO2 26 07/26/2018      Assessment & Plan:  Insulin-requiring type 2 DM, with renal insuff: worse.  AB: prednisone is affecting glycemic control.   Patient Instructions  check your blood sugar 3 times a day.  vary the time of day when you check, between before the 3 meals, and at bedtime.  also check if you have symptoms of your blood sugar being too high or too low.  please keep a record of the readings and bring it to your next appointment here (or you can bring the meter itself).  You can write it on any piece of paper.  please call us sooner if your blood sugar goes below 70, or if you have a lot of readings over 200. Please change the insulins to the numbers listed below. You can take an extra 5 units of the novolog, for any blood sugar over 200.   Please come back for a follow-up appointment in 2 months.

## 2018-08-25 NOTE — Patient Instructions (Addendum)
check your blood sugar 3 times a day.  vary the time of day when you check, between before the 3 meals, and at bedtime.  also check if you have symptoms of your blood sugar being too high or too low.  please keep a record of the readings and bring it to your next appointment here (or you can bring the meter itself).  You can write it on any piece of paper.  please call us sooner if your blood sugar goes below 70, or if you have a lot of readings over 200. Please change the insulins to the numbers listed below. You can take an extra 5 units of the novolog, for any blood sugar over 200.   Please come back for a follow-up appointment in 2 months.

## 2018-08-27 ENCOUNTER — Other Ambulatory Visit: Payer: Self-pay | Admitting: Family Medicine

## 2018-08-28 ENCOUNTER — Telehealth: Payer: Self-pay | Admitting: Endocrinology

## 2018-08-28 ENCOUNTER — Encounter: Payer: BLUE CROSS/BLUE SHIELD | Admitting: Physical Therapy

## 2018-08-28 NOTE — Telephone Encounter (Signed)
Please call pt

## 2018-08-28 NOTE — Telephone Encounter (Signed)
   Primary Cardiologist: [name], MD  Chart reviewed as part of pre-operative protocol coverage. Patient was contacted 08/28/2018 in reference to pre-operative risk assessment for pending surgery as outlined below.  [name] was last seen on 07/12/2018 by Dr. Ellyn Hack.  Since that day, [name] has done well.  Patient had a essentially normal coronary artery on cardiac catheterization 2018.  Recent heart monitor showed normal sinus rhythm with occasional PACs and PVCs which are benign.  She is cleared to proceed with the procedure without further evaluation.  See Dr. Allison Quarry comment below.  Therefore, based on ACC/AHA guidelines, the patient would be at acceptable risk for the planned procedure without further cardiovascular testing.   I will route this recommendation to the requesting party via Epic fax function and remove from pre-op pool.  Please call with questions.  [place], [place] 08/28/2018, 5:21 PM

## 2018-08-28 NOTE — Telephone Encounter (Signed)
please call patient: I received letter from Dr Milinda Pointer, about the upcoming surgery.  The night before surgery, take just 15 units of lantus.  On the day of surgery, skip the novolog until you are eating again.  Then start just half the usual amount of novolog until your eating is back to normal.  Please call if any problems.

## 2018-08-28 NOTE — Telephone Encounter (Signed)
Called pt and provided her with Dr. Cordelia Pen orders. Verbalized acceptance and understanding.

## 2018-08-28 NOTE — Telephone Encounter (Signed)
Monitor is essentially normal.  I agree there is minimal PACs and PVCs.  No tachycardia.  Minimal CAD on cath.  From a cardiac standpoint would be low risk with no active angina symptoms.  Not really having CHF symptoms.   [name], MD

## 2018-08-28 NOTE — Telephone Encounter (Signed)
Dr. Ellyn Hack to review recent heart monitor result. I reviewed raw report, low PAC and PVC burden, all NSR without sign of significant ventricular ectopy other than PVC. No sign of afib or aflutter. There were several manual triggered event for chest pain and SOB, no significant underlying rhythm to explain the symptom. Last cath 03/11/2017 showed normal LCx, LM, and RCA, minimal luminal irregularities in LAD. Will defer to Dr. Ellyn Hack with regard to clearance.   Dr. Ellyn Hack, please send your response to CV DIV preop pool  Signed, Almyra Deforest PA Pager: [phone]

## 2018-08-29 ENCOUNTER — Ambulatory Visit (INDEPENDENT_AMBULATORY_CARE_PROVIDER_SITE_OTHER): Payer: BLUE CROSS/BLUE SHIELD | Admitting: Podiatry

## 2018-08-29 DIAGNOSIS — Q828 Other specified congenital malformations of skin: Secondary | ICD-10-CM | POA: Diagnosis not present

## 2018-08-29 DIAGNOSIS — M2042 Other hammer toe(s) (acquired), left foot: Secondary | ICD-10-CM

## 2018-08-29 DIAGNOSIS — E1142 Type 2 diabetes mellitus with diabetic polyneuropathy: Secondary | ICD-10-CM

## 2018-08-29 DIAGNOSIS — M2041 Other hammer toe(s) (acquired), right foot: Secondary | ICD-10-CM

## 2018-08-29 DIAGNOSIS — B351 Tinea unguium: Secondary | ICD-10-CM

## 2018-08-29 DIAGNOSIS — M79676 Pain in unspecified toe(s): Secondary | ICD-10-CM

## 2018-08-29 NOTE — Progress Notes (Signed)
She presents today complaining of painful elongated toenails and calluses bilateral.  She would like to let me know that her cardiologist has approved her for surgical intervention.  Objective: Vital signs are stable alert and oriented x3 a hammertoe mallet toe deformities bilateral resulting in distal clavi she also has pain in limb secondary to thick yellow dystrophic-like mycotic nails.  Assessment: Hammertoe deformities resulting in distal clavi and mallet toe hallux bilateral resulting in reactive hyper keratomas painful in nature.  Pain in limb secondary onychomycosis.  Plan: Debridement of toenails 1 through 5 bilateral.  Debridement of mycotic nails.  Debridement of all reactive hyperkeratotic tissue.  She will follow-up with Dr. Amalia Hailey for a hospital surgery consult.

## 2018-08-30 ENCOUNTER — Encounter: Payer: BLUE CROSS/BLUE SHIELD | Admitting: Physical Therapy

## 2018-08-30 ENCOUNTER — Ambulatory Visit (INDEPENDENT_AMBULATORY_CARE_PROVIDER_SITE_OTHER): Payer: BLUE CROSS/BLUE SHIELD | Admitting: Podiatry

## 2018-08-30 DIAGNOSIS — M2041 Other hammer toe(s) (acquired), right foot: Secondary | ICD-10-CM | POA: Diagnosis not present

## 2018-08-30 DIAGNOSIS — M205X2 Other deformities of toe(s) (acquired), left foot: Secondary | ICD-10-CM | POA: Diagnosis not present

## 2018-08-30 DIAGNOSIS — M2042 Other hammer toe(s) (acquired), left foot: Secondary | ICD-10-CM | POA: Diagnosis not present

## 2018-08-30 NOTE — Patient Instructions (Signed)
Pre-Operative Instructions  Congratulations, you have decided to take an important step towards improving your quality of life.  You can be assured that the doctors and staff at [facility] will be with you every step of the way.  Here are some important things you should know:  1. Plan to be at the surgery center/hospital at least 1 (one) hour prior to your scheduled time, unless otherwise directed by the surgical center/hospital staff.  You must have a responsible adult accompany you, remain during the surgery and drive you home.  Make sure you have directions to the surgical center/hospital to ensure you arrive on time. 2. If you are having surgery at [facility] or [facility] hospitals, you will need a copy of your medical history and physical form from your family physician within one month prior to the date of surgery. We will give you a form for your primary physician to complete.  3. We make every effort to accommodate the date you request for surgery.  However, there are times where surgery dates or times have to be moved.  We will contact you as soon as possible if a change in schedule is required.   4. No aspirin/ibuprofen for one week before surgery.  If you are on aspirin, any non-steroidal anti-inflammatory medications (Mobic, Aleve, Ibuprofen) should not be taken seven (7) days prior to your surgery.  You make take Tylenol for pain prior to surgery.  5. Medications - If you are taking daily heart and blood pressure medications, seizure, reflux, allergy, asthma, anxiety, pain or diabetes medications, make sure you notify the surgery center/hospital before the day of surgery so they can tell you which medications you should take or avoid the day of surgery. 6. No food or drink after midnight the night before surgery unless directed otherwise by surgical center/hospital staff. 7. No alcoholic beverages 24-hours prior to surgery.  No smoking 24-hours prior or 24-hours after  surgery. 8. Wear loose pants or shorts. They should be loose enough to fit over bandages, boots, and casts. 9. Don't wear slip-on shoes. Sneakers are preferred. 10. Bring your boot with you to the surgery center/hospital.  Also bring crutches or a walker if your physician has prescribed it for you.  If you do not have this equipment, it will be provided for you after surgery. 11. If you have not been contacted by the surgery center/hospital by the day before your surgery, call to confirm the date and time of your surgery. 12. Leave-time from work may vary depending on the type of surgery you have.  Appropriate arrangements should be made prior to surgery with your employer. 13. Prescriptions will be provided immediately following surgery by your doctor.  Fill these as soon as possible after surgery and take the medication as directed. Pain medications will not be refilled on weekends and must be approved by the doctor. 14. Remove nail polish on the operative foot and avoid getting pedicures prior to surgery. 15. Wash the night before surgery.  The night before surgery wash the foot and leg well with water and the antibacterial soap provided. Be sure to pay special attention to beneath the toenails and in between the toes.  Wash for at least three (3) minutes. Rinse thoroughly with water and dry well with a towel.  Perform this wash unless told not to do so by your physician.  Enclosed: 1 Ice pack (please put in freezer the night before surgery)   1 Hibiclens skin cleaner     Pre-op instructions  If you have any questions regarding the instructions, please do not hesitate to call our office.  [place]: [address], , [place] [address] -- [phone]  [place]: [address] [place], [place] [address] -- [phone]  [place]: [address]  [place], [place] [address] -- [phone]  [place]: [address], [address], [place], [place] [address] -- [phone]  Website:  [email or web address] 

## 2018-09-03 NOTE — Progress Notes (Signed)
   HPI: 57 year old female presenting today for follow up evaluation of hammertoes bilaterally. She reports continued pain that is worsened by walking. She has not done anything for treatment recently. She is here to discuss surgical intervention. Patient is here for further evaluation and treatment.   Past Medical History:  Diagnosis Date  . Abnormal SPEP 04/17/2014  . Acute left ankle pain 01/26/2017  . ANEMIA-UNSPECIFIED 09/18/2009  . Anxiety   . Arthritis   . Chronic diastolic heart failure, NYHA class 2 (HCC)    Normal LVEDP by May 2018  . COPD (chronic obstructive pulmonary disease) ([place])   . Depression   . DIABETES MELLITUS, TYPE II 08/21/2006  . Diabetic osteomyelitis ([place]) 05/29/2015  . Fracture of 5th metatarsal    non union  . GERD 08/21/2006  . GOUT 08/20/2010  . Hx of umbilical hernia repair   . HYPERLIPIDEMIA 08/21/2006  . HYPERTENSION 08/21/2006  . Infection of wound due to methicillin resistant Staphylococcus aureus (MRSA)   . Internal hemorrhoids   . Multiple allergies 10/14/2016  . OBESITY 06/04/2009  . Onychomycosis 10/27/2015  . Osteomyelitis of left foot ([place]) 05/29/2015  . Pulmonary sarcoidosis ([place])    Followed locally by pulmonology, but also by Dr. Casper Harrison at [facility]  . Right knee pain 01/26/2017  . Vocal cord dysfunction   . Wears partial dentures       Objective: Physical Exam General: The patient is alert and oriented x3 in no acute distress.  Dermatology: Skin is cool, dry and supple bilateral lower extremities. Negative for open lesions or macerations.  Vascular: Palpable pedal pulses bilaterally. No edema or erythema noted. Capillary refill within normal limits.  Neurological: Epicritic and protective threshold grossly intact bilaterally.   Musculoskeletal Exam: All pedal and ankle joints range of motion within normal limits bilateral. Muscle strength 5/5 in all groups bilateral. Hammertoe contracture deformity noted to digits 2-5 of  the bilateral feet. Left mallet toe noted.   Radiographic Exam: DJD noted to IPJ with contracture of the left hallux.    Assessment: 1. Hammertoes noted digits 2-5 bilateral  2. Mallet toe left    Plan of Care:  1. Patient evaluated. X-Rays from 07/27/18 reviewed.  2. Today we discussed the conservative versus surgical management of the presenting pathology. The patient opts for surgical management. All possible complications and details of the procedure were explained. All patient questions were answered. No guarantees were expressed or implied. 3. Authorization for surgery was reinitiated today. Surgery will consist of PIPJ arthroplasty with screw fixation 2-4 bilateral. Hallux IPJ arthrodesis left. PIPJ arthroplasty with derotational skin plasty bilateral.  4. Medical and cardiac clearance already obtained.  5. Return to clinic one week post op.     Edrick Kins, DPM [facility]  Dr. Edrick Kins, DPM    [address], [place] [address]                Office [phone]  Fax [phone]

## 2018-09-05 ENCOUNTER — Other Ambulatory Visit: Payer: Self-pay | Admitting: Endocrinology

## 2018-09-06 ENCOUNTER — Other Ambulatory Visit: Payer: BLUE CROSS/BLUE SHIELD

## 2018-09-07 ENCOUNTER — Other Ambulatory Visit: Payer: Self-pay | Admitting: Cardiology

## 2018-09-08 DIAGNOSIS — Z794 Long term (current) use of insulin: Secondary | ICD-10-CM | POA: Diagnosis not present

## 2018-09-08 DIAGNOSIS — E119 Type 2 diabetes mellitus without complications: Secondary | ICD-10-CM | POA: Diagnosis not present

## 2018-09-11 ENCOUNTER — Telehealth: Payer: Self-pay | Admitting: Cardiology

## 2018-09-11 ENCOUNTER — Other Ambulatory Visit: Payer: Self-pay | Admitting: Cardiology

## 2018-09-11 ENCOUNTER — Telehealth: Payer: Self-pay | Admitting: *Deleted

## 2018-09-11 NOTE — Telephone Encounter (Signed)
Called patient husband and advised that it was important for her to keep that appointment as Dr.Harding wanted to see her back in 2 months prior to the monitor. Husband verbalized understanding.

## 2018-09-11 NOTE — Telephone Encounter (Signed)
"  I am Dr. Rebekah Chesterfield.  I'm just wondering if you all got my surgery scheduled yet.  If you would please give me a call and let me know if you all need anything.  I would appreciate it, thank you very much."

## 2018-09-11 NOTE — Telephone Encounter (Signed)
New Message   Patients spouse is calling on her behalf. He is inquiring about whether or not its imperative that his wife keeps her appt on 12/04. Please call to discuss.

## 2018-09-12 ENCOUNTER — Ambulatory Visit (INDEPENDENT_AMBULATORY_CARE_PROVIDER_SITE_OTHER): Payer: BLUE CROSS/BLUE SHIELD | Admitting: Family Medicine

## 2018-09-12 ENCOUNTER — Encounter: Payer: Self-pay | Admitting: Family Medicine

## 2018-09-12 VITALS — BP 122/64 | HR 75 | Temp 97.6°F | Ht 66.0 in | Wt 266.6 lb

## 2018-09-12 DIAGNOSIS — I110 Hypertensive heart disease with heart failure: Secondary | ICD-10-CM

## 2018-09-12 DIAGNOSIS — R05 Cough: Secondary | ICD-10-CM

## 2018-09-12 DIAGNOSIS — J3489 Other specified disorders of nose and nasal sinuses: Secondary | ICD-10-CM

## 2018-09-12 DIAGNOSIS — D860 Sarcoidosis of lung: Secondary | ICD-10-CM

## 2018-09-12 DIAGNOSIS — Z794 Long term (current) use of insulin: Secondary | ICD-10-CM

## 2018-09-12 DIAGNOSIS — I10 Essential (primary) hypertension: Secondary | ICD-10-CM

## 2018-09-12 DIAGNOSIS — E1129 Type 2 diabetes mellitus with other diabetic kidney complication: Secondary | ICD-10-CM

## 2018-09-12 DIAGNOSIS — E782 Mixed hyperlipidemia: Secondary | ICD-10-CM

## 2018-09-12 DIAGNOSIS — I5032 Chronic diastolic (congestive) heart failure: Secondary | ICD-10-CM

## 2018-09-12 DIAGNOSIS — Z8739 Personal history of other diseases of the musculoskeletal system and connective tissue: Secondary | ICD-10-CM

## 2018-09-12 DIAGNOSIS — R059 Cough, unspecified: Secondary | ICD-10-CM

## 2018-09-12 MED ORDER — PREDNISONE 20 MG PO TABS: ORAL_TABLET | ORAL | 0 refills | Status: DC

## 2018-09-12 NOTE — Assessment & Plan Note (Signed)
She will need clearance from pulmonary for pulmonary sarcoidosis

## 2018-09-12 NOTE — Progress Notes (Addendum)
Subjective:  [name] is a 57 y.o. year old very pleasant female patient who presents for/with See problem oriented charting ROS-no fever or chills.  No shortness of breath or wheezing yet.  Does have some sinus pressure.   Past Medical History:  Diagnosis Date  . Abnormal SPEP 04/17/2014  . Acute left ankle pain 01/26/2017  . ANEMIA-UNSPECIFIED 09/18/2009  . Anxiety   . Arthritis   . Chronic diastolic heart failure, NYHA class 2 (HCC)    Normal LVEDP by May 2018  . COPD (chronic obstructive pulmonary disease) ([place])   . Depression   . DIABETES MELLITUS, TYPE II 08/21/2006  . Diabetic osteomyelitis ([place]) 05/29/2015  . Fracture of 5th metatarsal    non union  . GERD 08/21/2006  . GOUT 08/20/2010  . Hx of umbilical hernia repair   . HYPERLIPIDEMIA 08/21/2006  . HYPERTENSION 08/21/2006  . Infection of wound due to methicillin resistant Staphylococcus aureus (MRSA)   . Internal hemorrhoids   . Multiple allergies 10/14/2016  . OBESITY 06/04/2009  . Onychomycosis 10/27/2015  . Osteomyelitis of left foot ([place]) 05/29/2015  . Pulmonary sarcoidosis ([place])    Followed locally by pulmonology, but also by Dr. Casper Harrison at [facility]  . Right knee pain 01/26/2017  . Vocal cord dysfunction   . Wears partial dentures    Patient Active Problem List   Diagnosis Date Noted  . Constipation 04/21/2018    Priority: High  . Obstructive chronic bronchitis without exacerbation ([place]) 09/18/2013    Priority: High  . Chest pain 04/11/2013    Priority: High  . Hypertensive heart disease with chronic diastolic congestive heart failure ([place])     Priority: High  . Sarcoidosis of lung ([place]) 04/10/2007    Priority: High  . Type II diabetes mellitus with renal manifestations ([place]) 08/21/2006    Priority: High  . Epiglottitis     Priority: Medium  . Osteoarthritis of right knee 01/26/2017    Priority: Medium  . CKD (chronic kidney disease) stage 3, GFR 30-59 ml/min (HCC) 04/27/2015     Priority: Medium  . History of osteomyelitis 03/27/2015    Priority: Medium  . Fatty liver 09/30/2014    Priority: Medium  . Depression     Priority: Medium  . Gout 08/20/2010    Priority: Medium  . Anemia 09/18/2009    Priority: Medium  . Sleep apnea 04/21/2009    Priority: Medium  . Hyperlipidemia 08/21/2006    Priority: Medium  . Essential hypertension 08/21/2006    Priority: Medium  . Total knee replacement status, left 12/07/2017    Priority: Low  . Dysphagia 09/20/2017    Priority: Low  . Plantar fasciitis, right 07/13/2017    Priority: Low  . S/P total knee arthroplasty, right 06/29/2017    Priority: Low  . Osteoarthrosis, localized, primary, knee, right     Priority: Low  . Sprain of calcaneofibular ligament of right ankle 06/06/2017    Priority: Low  . Onychomycosis 10/27/2015    Priority: Low  . MRSA (methicillin resistant staph aureus) culture positive 03/27/2015    Priority: Low  . Hot flashes 07/15/2014    Priority: Low  . Abnormal SPEP 04/17/2014    Priority: Low  . Fracture of left leg 04/17/2014    Priority: Low  . Cushingoid side effect of steroids ([place]) 04/17/2014    Priority: Low  . Internal hemorrhoids     Priority: Low  . Preoperative clearance 03/25/2014    Priority: Low  .  Solitary pulmonary nodule, on CT 02/2013 - stable over 2 years in 2015 02/20/2013    Priority: Low  . Morbid obesity ([place]) 06/04/2009    Priority: Low  . GERD 08/21/2006    Priority: Low  . Subcortical microvascular ischemic occlusive disease 07/12/2018  . Rapid palpitations 07/12/2018  . Impingement of left ankle joint 06/26/2018  . Anxiety 04/05/2018   Past Surgical History:  Procedure Laterality Date  . ABDOMINAL HYSTERECTOMY    . APPENDECTOMY    . BLADDER SUSPENSION  11/11/2011   Procedure: TRANSVAGINAL TAPE (TVT) PROCEDURE;  Surgeon: Olga Millers, MD;  Location: [facility];  Service: Gynecology;  Laterality: N/A;  . CAROTIDS  02/18/11   CAROTID DUPLEX;  VERTEBRALS ARE PATENT WITH ANTEGRADE FLOW. ICA/CCA RATIO 1.61 ON RIGHT AND 0.75 ON LEFT  . CHOLECYSTECTOMY  1984  . CYSTOSCOPY  11/11/2011   Procedure: CYSTOSCOPY;  Surgeon: Olga Millers, MD;  Location: [facility];  Service: Gynecology;  Laterality: N/A;  . EXTUBATION (ENDOTRACHEAL) IN OR N/A 09/23/2017   Procedure: EXTUBATION (ENDOTRACHEAL) IN OR;  Surgeon: Helayne Seminole, MD;  Location: [facility];  Service: ENT;  Laterality: N/A;  . FIBEROPTIC LARYNGOSCOPY AND TRACHEOSCOPY N/A 09/23/2017   Procedure: FLEXIBLE FIBEROPTIC LARYNGOSCOPY;  Surgeon: Helayne Seminole, MD;  Location: [place];  Service: ENT;  Laterality: N/A;  . FRACTURE SURGERY     foot  . HERNIA REPAIR    . I&D EXTREMITY Left 06/27/2015   Procedure: Partial Excision Left Calcaneus, Place Antibiotic Beads, and Wound VAC;  Surgeon: Newt Minion, MD;  Location: [place];  Service: Orthopedics;  Laterality: Left;  . KNEE ARTHROSCOPY     right  . LEFT AND RIGHT HEART CATHETERIZATION WITH CORONARY ANGIOGRAM N/A 04/23/2013   Procedure: LEFT AND RIGHT HEART CATHETERIZATION WITH CORONARY ANGIOGRAM;  Surgeon: Leonie Man, MD;  Location: [facility] C[facility] LAB;  Service: Cardiovascular;  Laterality: N/A;  . LEFT HEART CATH AND CORONARY ANGIOGRAPHY N/A 03/11/2017   Procedure: Left Heart Cath and Coronary Angiography;  Surgeon: Sherren Mocha, MD;  Location: [facility]; angiographically minimal CAD in the LAD otherwise normal.;  Normal LVEDP.  FALSE POSITIVE MYOVIEW  . LEFT HEART CATH AND CORONARY ANGIOGRAPHY  07/20/2010   LVEF 50-55% WITH VERY MILD GLOBAL HYPOKINESIA; ESSENTIALLY NORMAL CORONARY ARTERIES; NORMAL LV FUNCTION  . METATARSAL OSTEOTOMY WITH OPEN REDUCTION INTERNAL FIXATION (ORIF) METATARSAL WITH FUSION Left 04/09/2014   Procedure: LEFT FOOT FRACTURE OPEN TREATMENT METATARSAL INCLUDES INTERNAL FIXATION EACH;  Surgeon: Lorn Junes, MD;  Location: [facility];  Service: Orthopedics;  Laterality: Left;  . NISSEN  FUNDOPLICATION  6803  . NM MYOVIEW LTD  03/09/2013   Lexiscan --> EF 50%; NORMAL MYOCARDIAL PERFUSION STUDY - breast attenuation  . NM MYOVIEW LTD  03/10/2017   : Moderate size "stress-induced "perfusion defect at the apex as well as "ill-defined stress-induced perfusion defect in the lateral wall.  EF 55%.  INTERMEDIATE risk. -->  FALSE POSITIVE  . Right and left CARDIAC CATHETERIZATION  04/23/2013   Angiographic normal coronaries; LVEDP 20 mmHg, PCWP 12-14 mmHg, RAP 12 mmHg.; Fick CO/CI 4.9/2.2  . TOTAL KNEE ARTHROPLASTY Right 06/29/2017   Procedure: RIGHT TOTAL KNEE ARTHROPLASTY;  Surgeon: Newt Minion, MD;  Location: [facility];  Service: Orthopedics;  Laterality: Right;  . TOTAL KNEE ARTHROPLASTY Left 12/07/2017  . TOTAL KNEE ARTHROPLASTY Left 12/07/2017   Procedure: LEFT TOTAL KNEE ARTHROPLASTY;  Surgeon: Newt Minion, MD;  Location: [place];  Service:  Orthopedics;  Laterality: Left;  . TRACHEOSTOMY TUBE PLACEMENT N/A 09/20/2017   Procedure: AWAKE INTUBATION WITH ANESTHESIA WITH VIDEO ASSISTANCE;  Surgeon: Helayne Seminole, MD;  Location: [facility];  Service: ENT;  Laterality: N/A;  . TRANSTHORACIC ECHOCARDIOGRAM  08/2014   Normal LV size and function.  Mild LVH.  EF 55-60%.  Normal regional wall motion.  GR 1 DD.  Normal RV size and function .  [name] TUBAL LIGATION     with reversal in 1994  . VENTRAL HERNIA REPAIR      Family History  Problem Relation Age of Onset  . Diabetes Father   . Heart attack Father 43  . Coronary artery disease Father   . Heart failure Father   . Throat cancer Father   . COPD Mother   . Emphysema Mother   . Asthma Mother   . Heart failure Mother   . Breast cancer Mother   . Heart attack Maternal Grandfather   . Sarcoidosis Maternal Uncle   . Lung cancer Brother   . Diabetes Brother   . Colon cancer Neg Hx   . Rectal cancer Neg Hx     Allergies-reviewed and updated Allergies  Allergen Reactions  . Methotrexate Other (See Comments)    Peri-oral and  buccal lesions  . Vancomycin Other (See Comments)    DOSE RELATED NEPHROTOXICITY  . Lisinopril Cough  . Chlorhexidine Itching  . Clindamycin/Lincomycin Nausea And Vomiting and Rash    RASH > ALLERGY INTOLERANCE > NAUSEA & VOMITING  . Doxycycline Rash  . Teflaro [Ceftaroline] Rash    Tolerates ceftriaxone     Social History   Social History Narrative   Married 1994. 2 sons who both live close and 1 grandson.       Disability due to sarcoidosis. 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 albuterol (PROVENTIL HFA;VENTOLIN HFA) 108 (90 Base) MCG/ACT inhaler Inhale 1-2 puffs into the lungs every 6 (six) hours as needed for wheezing or shortness of breath. 8 g 5  . allopurinol (ZYLOPRIM) 100 MG tablet Take 100 mg by mouth 2 (two) times daily.     [name] aspirin EC 81 MG tablet Take 81 mg by mouth daily.     [name] atorvastatin (LIPITOR) 40 MG tablet TAKE 1 TAB BY MOUTH ONCE DAILY 90 tablet 3  . BAYER MICROLET LANCETS lancets Use as instructed to check sugar 3 times daily 300 each 5  . buPROPion (WELLBUTRIN XL) 300 MG 24 hr tablet Take 1 tablet (300 mg total) by mouth daily. 90 tablet 3  . Calcium Citrate 200 MG TABS Take 200 mg by mouth daily.    . carvedilol (COREG) 25 MG tablet TAKE 1 TABLET (25 MG TOTAL) BY MOUTH 2 (TWO) TIMES DAILY WITH A MEAL. 180 tablet 3  . chlorpheniramine-HYDROcodone (TUSSIONEX) 10-8 MG/5ML SUER   0  . Cholecalciferol (VITAMIN D) 2000 units tablet Take 2,000 Units by mouth daily.    . Cyanocobalamin (VITAMIN B-12 PO) Take 2,500 mcg by mouth daily.     . cyclobenzaprine (FLEXERIL) 5 MG tablet Take 1 tablet at bedtime x 3 days, then increase to 1 tablet twice daily 60 tablet 1  . DEXILANT 60 MG capsule TAKE 1 CAPSULE BY MOUTH EVERY  DAY 30 capsule 0  . dicyclomine (BENTYL) 20 MG tablet Take one tablet 15 minutes before meals 90 tablet 1  . diltiazem (CARDIZEM CD) 120 MG 24 hr capsule TAKE 1 CAPSULE BY MOUTH EVERY DAY (FOR FUTURE REFILLS MAKE A DOCTORS VISIT) 90 capsule 3  . fenofibrate (TRICOR) 145 MG tablet TAKE 1 TABLET BY MOUTH EVERY DAY 30 tablet 11  . fluticasone (FLONASE) 50 MCG/ACT nasal spray PLACE 2 SPRAYS INTO BOTH NOSTRILS DAILY AS NEEDED FOR ALLERGIES 48 g 1  . fluticasone furoate-vilanterol (BREO ELLIPTA) 200-25 MCG/INH AEPB Inhale 1 puff into the lungs daily.    . furosemide (LASIX) 40 MG tablet TAKE 1 TABLET BY MOUTH TWICE A DAY 180 tablet 3  . gabapentin (NEURONTIN) 100 MG capsule TAKE 1 CAPSULE BY MOUTH THREE TIMES A DAY 270 capsule 0  . glucose blood (BAYER CONTOUR TEST) test strip Use as instructed to check sugar 3 times daily 300 each 5  . insulin aspart (NOVOLOG FLEXPEN) 100 UNIT/ML FlexPen Inject 40-45 Units into the skin 3 (three) times daily with meals. 20 pen 5  . Insulin Glargine, 1 Unit Dial, (TOUJEO SOLOSTAR) 300 UNIT/ML SOPN Inject 25 Units into the skin at bedtime. 5 pen   . Insulin Pen Needle 33G X 4 MM MISC 1 each by Does not apply route 4 (four) times daily. 200 each 11  . ipratropium (ATROVENT) 0.06 % nasal spray Place 2 sprays into both nostrils 4 (four) times daily. 15 mL 0  . isosorbide mononitrate (IMDUR) 30 MG 24 hr tablet Take 1 tablet (30 mg total) by mouth at bedtime. 90 tablet 3  . KLOR-CON M20 20 MEQ tablet TAKE 1 & 1/2 TABLETS BY MOUTH TWICE A DAY  2  . linaclotide (LINZESS) 290 MCG CAPS capsule Take 1 capsule (290 mcg total) by mouth daily before breakfast. 30 capsule 11  . LORazepam (ATIVAN) 0.5 MG tablet Take 0.5 mg by mouth every 12 (twelve) hours as needed for anxiety.    . metFORMIN (GLUCOPHAGE) 1000 MG tablet TAKE 1 TABLET BY MOUTH TWICE A DAY WITH A MEAL 180 tablet 0  . nitroGLYCERIN (NITROSTAT) 0.4 MG SL tablet Place 1 tablet (0.4 mg total) under the tongue every 5 (five)  minutes as needed for chest pain. Reported on 01/05/2016 25 tablet 6  . ondansetron (ZOFRAN-ODT) 4 MG disintegrating tablet Take 1 tablet (4 mg total) by mouth every 8 (eight) hours as needed for nausea or vomiting. 20 tablet 1  . oxyCODONE-acetaminophen (PERCOCET/ROXICET) 5-325 MG tablet Take 1 tablet by mouth every 4 (four) hours as needed for severe pain. 8 tablet 0  . PARoxetine Mesylate (BRISDELLE) 7.5 MG CAPS Take 7.5 mg by mouth daily.    . Potassium Chloride ER 20 MEQ TBCR TAKE ONE AND ONE-HALF TABLETS BY MOUTH 2 TIMES DAILY 270 tablet 2  . predniSONE (DELTASONE) 5 MG tablet Take 5 mg by mouth daily with breakfast.    . predniSONE (DELTASONE) 50 MG tablet Take 1 tablet daily 5 tablet 0  . senna (SENOKOT) 8.6 MG TABS tablet Take 1 tablet (8.6 mg total) by mouth daily as needed for mild constipation or moderate constipation. 120 each 0  . telmisartan (MICARDIS) 20 MG tablet TAKE 1 TABLET BY MOUTH EVERY DAY 90 tablet 2  . TOUJEO SOLOSTAR 300 UNIT/ML SOPN INJECT 40 UNITS INTO THE SKIN AT BEDTIME 15 pen 1  . triamcinolone cream (KENALOG) 0.1 % Apply 1 application topically 2 (two) times daily as needed (for rash. stop after  7-10 days.). 80 g 0  . venlafaxine XR (EFFEXOR-XR) 75 MG 24 hr capsule Take 1 capsule (75 mg total) by mouth 2 (two) times daily.    . Vitamin D-Vitamin K (K2 PLUS D3) 7323159726 MCG-UNIT TABS cholecalciferol (vit D3) 1,000 unit-vitamin K2 (MK4) 100 mcg tablet  Take by oral route.     No current facility-administered medications for this visit.     Objective: BP 122/64 (BP Location: Left Arm, Patient Position: Sitting, Cuff Size: Large)   Pulse 75   Temp 97.6 F (36.4 C) (Oral)   Ht 5\' 6"  (1.676 m)   Wt 266 lb 9.6 oz (120.9 kg)   SpO2 97%   BMI 43.03 kg/m  Gen: NAD, intermittent cough-appears mildly fatigued Tympanic membranes normal, erythematous nasal sinus passages, maxillary sinus tenderness bilaterally, pharynx mildly erythematous CV: RRR no murmurs rubs or  gallops Lungs: CTAB no crackles, wheeze, rhonchi Abdomen: soft/nontender/nondistended/normal bowel sounds. No rebound or guarding.  Ext: no edema Skin: warm, dry  Assessment/Plan:  Patient presents for acute issue of cough but also asks for preoperative letter to be written [facility] for hammertoe [facility].  She plans to cancel her visit on the 17th  Hyperlipidemia S:  controlled on Atorvastatin 40mg , Tricor 145mg  in the past Lab Results  Component Value Date   CHOL 163 03/10/2017   HDL 35 (L) 03/10/2017   LDLCALC 49 03/10/2017   LDLDIRECT 76.0 01/15/2016   TRIG 197 (H) 09/20/2017   CHOLHDL 4.7 03/10/2017   A/P: Likely stable-do need to repeat lipids with next visit when does not have acute infection    Essential hypertension S: controlled on Cartia XT 120mg , carvedilol 25 mg BID,  Lasix 40mg  BID, telmisartan 20mg  BP Readings from Last 3 Encounters:  09/12/18 122/64  08/25/18 124/60  08/23/18 (!) 132/50  A/P: We discussed blood pressure goal of <140/90. Continue current meds    History of osteomyelitis I do want to be noted that patient has a history of osteomyelitis- was followed by infectious disease dating back to 2016.  She is high risk for infection particularly given her diabetic neuropathy chronic prednisone, numerous comorbidities.  Sarcoidosis of lung ([place]) She will need clearance from pulmonary for pulmonary sarcoidosis  Hypertensive heart disease with chronic diastolic congestive heart failure ([place]) She will need clearance from cardiology-she remains on Lasix 20 mg twice daily  Type II diabetes mellitus with renal manifestations ([place]) She will need clearance from endocrinology for this  Cough Sinus pressure S: last visit was seen with cough/wheeze- had not improved with initial round of prednisone/augmentin but then given another round of prednisone by [facility]- she saw me day after starting prednisone but we opted to wait another day to see how  she would do on prednisone- fortunately she improved and never had to take the doxycycline (she did pick it up and has this at home still).   Current symptoms started 2-3 days ago. Feels some sore throat, increased cough. Last year had bad experience going to the beach and getting really ill- she leaves for the beach on Friday. Not really wheezing with current illness at present. Tussin cough syrup at night has helped her sleep (knows not to drive for 8 hours after use).   No shortness of breath or fever. No nausea or vomiting. Using her regular inhalers.   A/P:From AVS:  " This looks like an upper respiratory infection and mild viral sinus infection.  Since you have already taken 3 doses of Augmentin-go  ahead and finish the other 3 though.  Then I want you to monitor over the next few days 1.  If symptoms significantly worsen from sinus pressure and discharge perspective- may start the doxycycline previously prescribed 2.  If wheeze and shortness of breath start-please start the prednisone instead.  We are primarily doing this in case symptoms/issues happen over the weekend with you out of town-but if you are trying to make a decision during the week I would love to be kept in the loop"  Patient typically comes in for as needed acute visits-we need to get her scheduled for a regular checkup Future Appointments  Date Time Provider [place]  09/13/2018 10:20 AM Leonie Man, MD [facility]  10/25/2018  8:15 AM Renato Shin, MD [facility] None   Meds ordered this encounter  Medications  . predniSONE (DELTASONE) 20 MG tablet    Sig: Take 2 pills for 3 days, 1 pill for 4 days    Dispense:  10 tablet    Refill:  0   Return precautions advised.  Garret Reddish, MD

## 2018-09-12 NOTE — H&P (View-Only) (Signed)
Subjective:  [name] is a 57 y.o. year old very pleasant female patient who presents for/with See problem oriented charting ROS-no fever or chills.  No shortness of breath or wheezing yet.  Does have some sinus pressure.   Past Medical History:  Diagnosis Date  . Abnormal SPEP 04/17/2014  . Acute left ankle pain 01/26/2017  . ANEMIA-UNSPECIFIED 09/18/2009  . Anxiety   . Arthritis   . Chronic diastolic heart failure, NYHA class 2 (HCC)    Normal LVEDP by May 2018  . COPD (chronic obstructive pulmonary disease) ([place])   . Depression   . DIABETES MELLITUS, TYPE II 08/21/2006  . Diabetic osteomyelitis ([place]) 05/29/2015  . Fracture of 5th metatarsal    non union  . GERD 08/21/2006  . GOUT 08/20/2010  . Hx of umbilical hernia repair   . HYPERLIPIDEMIA 08/21/2006  . HYPERTENSION 08/21/2006  . Infection of wound due to methicillin resistant Staphylococcus aureus (MRSA)   . Internal hemorrhoids   . Multiple allergies 10/14/2016  . OBESITY 06/04/2009  . Onychomycosis 10/27/2015  . Osteomyelitis of left foot ([place]) 05/29/2015  . Pulmonary sarcoidosis ([place])    Followed locally by pulmonology, but also by Dr. Casper Harrison at [facility]  . Right knee pain 01/26/2017  . Vocal cord dysfunction   . Wears partial dentures    Patient Active Problem List   Diagnosis Date Noted  . Constipation 04/21/2018    Priority: High  . Obstructive chronic bronchitis without exacerbation ([place]) 09/18/2013    Priority: High  . Chest pain 04/11/2013    Priority: High  . Hypertensive heart disease with chronic diastolic congestive heart failure ([place])     Priority: High  . Sarcoidosis of lung ([place]) 04/10/2007    Priority: High  . Type II diabetes mellitus with renal manifestations ([place]) 08/21/2006    Priority: High  . Epiglottitis     Priority: Medium  . Osteoarthritis of right knee 01/26/2017    Priority: Medium  . CKD (chronic kidney disease) stage 3, GFR 30-59 ml/min (HCC) 04/27/2015     Priority: Medium  . History of osteomyelitis 03/27/2015    Priority: Medium  . Fatty liver 09/30/2014    Priority: Medium  . Depression     Priority: Medium  . Gout 08/20/2010    Priority: Medium  . Anemia 09/18/2009    Priority: Medium  . Sleep apnea 04/21/2009    Priority: Medium  . Hyperlipidemia 08/21/2006    Priority: Medium  . Essential hypertension 08/21/2006    Priority: Medium  . Total knee replacement status, left 12/07/2017    Priority: Low  . Dysphagia 09/20/2017    Priority: Low  . Plantar fasciitis, right 07/13/2017    Priority: Low  . S/P total knee arthroplasty, right 06/29/2017    Priority: Low  . Osteoarthrosis, localized, primary, knee, right     Priority: Low  . Sprain of calcaneofibular ligament of right ankle 06/06/2017    Priority: Low  . Onychomycosis 10/27/2015    Priority: Low  . MRSA (methicillin resistant staph aureus) culture positive 03/27/2015    Priority: Low  . Hot flashes 07/15/2014    Priority: Low  . Abnormal SPEP 04/17/2014    Priority: Low  . Fracture of left leg 04/17/2014    Priority: Low  . Cushingoid side effect of steroids ([place]) 04/17/2014    Priority: Low  . Internal hemorrhoids     Priority: Low  . Preoperative clearance 03/25/2014    Priority: Low  .  Solitary pulmonary nodule, on CT 02/2013 - stable over 2 years in 2015 02/20/2013    Priority: Low  . Morbid obesity ([place]) 06/04/2009    Priority: Low  . GERD 08/21/2006    Priority: Low  . Subcortical microvascular ischemic occlusive disease 07/12/2018  . Rapid palpitations 07/12/2018  . Impingement of left ankle joint 06/26/2018  . Anxiety 04/05/2018   Past Surgical History:  Procedure Laterality Date  . ABDOMINAL HYSTERECTOMY    . APPENDECTOMY    . BLADDER SUSPENSION  11/11/2011   Procedure: TRANSVAGINAL TAPE (TVT) PROCEDURE;  Surgeon: Olga Millers, MD;  Location: [facility];  Service: Gynecology;  Laterality: N/A;  . CAROTIDS  02/18/11   CAROTID DUPLEX;  VERTEBRALS ARE PATENT WITH ANTEGRADE FLOW. ICA/CCA RATIO 1.61 ON RIGHT AND 0.75 ON LEFT  . CHOLECYSTECTOMY  1984  . CYSTOSCOPY  11/11/2011   Procedure: CYSTOSCOPY;  Surgeon: Olga Millers, MD;  Location: [facility];  Service: Gynecology;  Laterality: N/A;  . EXTUBATION (ENDOTRACHEAL) IN OR N/A 09/23/2017   Procedure: EXTUBATION (ENDOTRACHEAL) IN OR;  Surgeon: Helayne Seminole, MD;  Location: [place];  Service: ENT;  Laterality: N/A;  . FIBEROPTIC LARYNGOSCOPY AND TRACHEOSCOPY N/A 09/23/2017   Procedure: FLEXIBLE FIBEROPTIC LARYNGOSCOPY;  Surgeon: Helayne Seminole, MD;  Location: [place];  Service: ENT;  Laterality: N/A;  . FRACTURE SURGERY     foot  . HERNIA REPAIR    . I&D EXTREMITY Left 06/27/2015   Procedure: Partial Excision Left Calcaneus, Place Antibiotic Beads, and Wound VAC;  Surgeon: Newt Minion, MD;  Location: [place];  Service: Orthopedics;  Laterality: Left;  . KNEE ARTHROSCOPY     right  . LEFT AND RIGHT HEART CATHETERIZATION WITH CORONARY ANGIOGRAM N/A 04/23/2013   Procedure: LEFT AND RIGHT HEART CATHETERIZATION WITH CORONARY ANGIOGRAM;  Surgeon: Leonie Man, MD;  Location: [facility] C[facility];  Service: Cardiovascular;  Laterality: N/A;  . LEFT HEART CATH AND CORONARY ANGIOGRAPHY N/A 03/11/2017   Procedure: Left Heart Cath and Coronary Angiography;  Surgeon: Sherren Mocha, MD;  Location: [place] CV LAB; angiographically minimal CAD in the LAD otherwise normal.;  Normal LVEDP.  FALSE POSITIVE MYOVIEW  . LEFT HEART CATH AND CORONARY ANGIOGRAPHY  07/20/2010   LVEF 50-55% WITH VERY MILD GLOBAL HYPOKINESIA; ESSENTIALLY NORMAL CORONARY ARTERIES; NORMAL LV FUNCTION  . METATARSAL OSTEOTOMY WITH OPEN REDUCTION INTERNAL FIXATION (ORIF) METATARSAL WITH FUSION Left 04/09/2014   Procedure: LEFT FOOT FRACTURE OPEN TREATMENT METATARSAL INCLUDES INTERNAL FIXATION EACH;  Surgeon: Lorn Junes, MD;  Location: [place];  Service: Orthopedics;  Laterality: Left;  . NISSEN  FUNDOPLICATION  3474  . NM MYOVIEW LTD  03/09/2013   Lexiscan --> EF 50%; NORMAL MYOCARDIAL PERFUSION STUDY - breast attenuation  . NM MYOVIEW LTD  03/10/2017   : Moderate size "stress-induced "perfusion defect at the apex as well as "ill-defined stress-induced perfusion defect in the lateral wall.  EF 55%.  INTERMEDIATE risk. -->  FALSE POSITIVE  . Right and left CARDIAC CATHETERIZATION  04/23/2013   Angiographic normal coronaries; LVEDP 20 mmHg, PCWP 12-14 mmHg, RAP 12 mmHg.; Fick CO/CI 4.9/2.2  . TOTAL KNEE ARTHROPLASTY Right 06/29/2017   Procedure: RIGHT TOTAL KNEE ARTHROPLASTY;  Surgeon: Newt Minion, MD;  Location: [place];  Service: Orthopedics;  Laterality: Right;  . TOTAL KNEE ARTHROPLASTY Left 12/07/2017  . TOTAL KNEE ARTHROPLASTY Left 12/07/2017   Procedure: LEFT TOTAL KNEE ARTHROPLASTY;  Surgeon: Newt Minion, MD;  Location: [place];  Service:  Orthopedics;  Laterality: Left;  . TRACHEOSTOMY TUBE PLACEMENT N/A 09/20/2017   Procedure: AWAKE INTUBATION WITH ANESTHESIA WITH VIDEO ASSISTANCE;  Surgeon: Helayne Seminole, MD;  Location: [facility];  Service: ENT;  Laterality: N/A;  . TRANSTHORACIC ECHOCARDIOGRAM  08/2014   Normal LV size and function.  Mild LVH.  EF 55-60%.  Normal regional wall motion.  GR 1 DD.  Normal RV size and function .  [name] TUBAL LIGATION     with reversal in 1994  . VENTRAL HERNIA REPAIR      Family History  Problem Relation Age of Onset  . Diabetes Father   . Heart attack Father 52  . Coronary artery disease Father   . Heart failure Father   . Throat cancer Father   . COPD Mother   . Emphysema Mother   . Asthma Mother   . Heart failure Mother   . Breast cancer Mother   . Heart attack Maternal Grandfather   . Sarcoidosis Maternal Uncle   . Lung cancer Brother   . Diabetes Brother   . Colon cancer Neg Hx   . Rectal cancer Neg Hx     Allergies-reviewed and updated Allergies  Allergen Reactions  . Methotrexate Other (See Comments)    Peri-oral and  buccal lesions  . Vancomycin Other (See Comments)    DOSE RELATED NEPHROTOXICITY  . Lisinopril Cough  . Chlorhexidine Itching  . Clindamycin/Lincomycin Nausea And Vomiting and Rash    RASH > ALLERGY INTOLERANCE > NAUSEA & VOMITING  . Doxycycline Rash  . Teflaro [Ceftaroline] Rash    Tolerates ceftriaxone     Social History   Social History Narrative   Married 1994. 2 sons who both live close and 1 grandson.       Disability due to sarcoidosis. 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 albuterol (PROVENTIL HFA;VENTOLIN HFA) 108 (90 Base) MCG/ACT inhaler Inhale 1-2 puffs into the lungs every 6 (six) hours as needed for wheezing or shortness of breath. 8 g 5  . allopurinol (ZYLOPRIM) 100 MG tablet Take 100 mg by mouth 2 (two) times daily.     [name] aspirin EC 81 MG tablet Take 81 mg by mouth daily.     [name] atorvastatin (LIPITOR) 40 MG tablet TAKE 1 TAB BY MOUTH ONCE DAILY 90 tablet 3  . BAYER MICROLET LANCETS lancets Use as instructed to check sugar 3 times daily 300 each 5  . buPROPion (WELLBUTRIN XL) 300 MG 24 hr tablet Take 1 tablet (300 mg total) by mouth daily. 90 tablet 3  . Calcium Citrate 200 MG TABS Take 200 mg by mouth daily.    . carvedilol (COREG) 25 MG tablet TAKE 1 TABLET (25 MG TOTAL) BY MOUTH 2 (TWO) TIMES DAILY WITH A MEAL. 180 tablet 3  . chlorpheniramine-HYDROcodone (TUSSIONEX) 10-8 MG/5ML SUER   0  . Cholecalciferol (VITAMIN D) 2000 units tablet Take 2,000 Units by mouth daily.    . Cyanocobalamin (VITAMIN B-12 PO) Take 2,500 mcg by mouth daily.     . cyclobenzaprine (FLEXERIL) 5 MG tablet Take 1 tablet at bedtime x 3 days, then increase to 1 tablet twice daily 60 tablet 1  . DEXILANT 60 MG capsule TAKE 1 CAPSULE BY MOUTH EVERY  DAY 30 capsule 0  . dicyclomine (BENTYL) 20 MG tablet Take one tablet 15 minutes before meals 90 tablet 1  . diltiazem (CARDIZEM CD) 120 MG 24 hr capsule TAKE 1 CAPSULE BY MOUTH EVERY DAY (FOR FUTURE REFILLS MAKE A DOCTORS VISIT) 90 capsule 3  . fenofibrate (TRICOR) 145 MG tablet TAKE 1 TABLET BY MOUTH EVERY DAY 30 tablet 11  . fluticasone (FLONASE) 50 MCG/ACT nasal spray PLACE 2 SPRAYS INTO BOTH NOSTRILS DAILY AS NEEDED FOR ALLERGIES 48 g 1  . fluticasone furoate-vilanterol (BREO ELLIPTA) 200-25 MCG/INH AEPB Inhale 1 puff into the lungs daily.    . furosemide (LASIX) 40 MG tablet TAKE 1 TABLET BY MOUTH TWICE A DAY 180 tablet 3  . gabapentin (NEURONTIN) 100 MG capsule TAKE 1 CAPSULE BY MOUTH THREE TIMES A DAY 270 capsule 0  . glucose blood (BAYER CONTOUR TEST) test strip Use as instructed to check sugar 3 times daily 300 each 5  . insulin aspart (NOVOLOG FLEXPEN) 100 UNIT/ML FlexPen Inject 40-45 Units into the skin 3 (three) times daily with meals. 20 pen 5  . Insulin Glargine, 1 Unit Dial, (TOUJEO SOLOSTAR) 300 UNIT/ML SOPN Inject 25 Units into the skin at bedtime. 5 pen   . Insulin Pen Needle 33G X 4 MM MISC 1 each by Does not apply route 4 (four) times daily. 200 each 11  . ipratropium (ATROVENT) 0.06 % nasal spray Place 2 sprays into both nostrils 4 (four) times daily. 15 mL 0  . isosorbide mononitrate (IMDUR) 30 MG 24 hr tablet Take 1 tablet (30 mg total) by mouth at bedtime. 90 tablet 3  . KLOR-CON M20 20 MEQ tablet TAKE 1 & 1/2 TABLETS BY MOUTH TWICE A DAY  2  . linaclotide (LINZESS) 290 MCG CAPS capsule Take 1 capsule (290 mcg total) by mouth daily before breakfast. 30 capsule 11  . LORazepam (ATIVAN) 0.5 MG tablet Take 0.5 mg by mouth every 12 (twelve) hours as needed for anxiety.    . metFORMIN (GLUCOPHAGE) 1000 MG tablet TAKE 1 TABLET BY MOUTH TWICE A DAY WITH A MEAL 180 tablet 0  . nitroGLYCERIN (NITROSTAT) 0.4 MG SL tablet Place 1 tablet (0.4 mg total) under the tongue every 5 (five)  minutes as needed for chest pain. Reported on 01/05/2016 25 tablet 6  . ondansetron (ZOFRAN-ODT) 4 MG disintegrating tablet Take 1 tablet (4 mg total) by mouth every 8 (eight) hours as needed for nausea or vomiting. 20 tablet 1  . oxyCODONE-acetaminophen (PERCOCET/ROXICET) 5-325 MG tablet Take 1 tablet by mouth every 4 (four) hours as needed for severe pain. 8 tablet 0  . PARoxetine Mesylate (BRISDELLE) 7.5 MG CAPS Take 7.5 mg by mouth daily.    . Potassium Chloride ER 20 MEQ TBCR TAKE ONE AND ONE-HALF TABLETS BY MOUTH 2 TIMES DAILY 270 tablet 2  . predniSONE (DELTASONE) 5 MG tablet Take 5 mg by mouth daily with breakfast.    . predniSONE (DELTASONE) 50 MG tablet Take 1 tablet daily 5 tablet 0  . senna (SENOKOT) 8.6 MG TABS tablet Take 1 tablet (8.6 mg total) by mouth daily as needed for mild constipation or moderate constipation. 120 each 0  . telmisartan (MICARDIS) 20 MG tablet TAKE 1 TABLET BY MOUTH EVERY DAY 90 tablet 2  . TOUJEO SOLOSTAR 300 UNIT/ML SOPN INJECT 40 UNITS INTO THE SKIN AT BEDTIME 15 pen 1  . triamcinolone cream (KENALOG) 0.1 % Apply 1 application topically 2 (two) times daily as needed (for rash. stop after  7-10 days.). 80 g 0  . venlafaxine XR (EFFEXOR-XR) 75 MG 24 hr capsule Take 1 capsule (75 mg total) by mouth 2 (two) times daily.    . Vitamin D-Vitamin K (K2 PLUS D3) [phone] MCG-UNIT TABS cholecalciferol (vit D3) 1,000 unit-vitamin K2 (MK4) 100 mcg tablet  Take by oral route.     No current facility-administered medications for this visit.     Objective: BP 122/64 (BP Location: Left Arm, Patient Position: Sitting, Cuff Size: Large)   Pulse 75   Temp 97.6 F (36.4 C) (Oral)   Ht 5\' 6"  (1.676 m)   Wt 266 lb 9.6 oz (120.9 kg)   SpO2 97%   BMI 43.03 kg/m  Gen: NAD, intermittent cough-appears mildly fatigued Tympanic membranes normal, erythematous nasal sinus passages, maxillary sinus tenderness bilaterally, pharynx mildly erythematous CV: RRR no murmurs rubs or  gallops Lungs: CTAB no crackles, wheeze, rhonchi Abdomen: soft/nontender/nondistended/normal bowel sounds. No rebound or guarding.  Ext: no edema Skin: warm, dry  Assessment/Plan:  Patient presents for acute issue of cough but also asks for preoperative letter to be written to [facility] for hammertoe surgery.  She plans to cancel her visit on the 17th  Hyperlipidemia S:  controlled on Atorvastatin 40mg , Tricor 145mg  in the past Lab Results  Component Value Date   CHOL 163 03/10/2017   HDL 35 (L) 03/10/2017   LDLCALC 49 03/10/2017   LDLDIRECT 76.0 01/15/2016   TRIG 197 (H) 09/20/2017   CHOLHDL 4.7 03/10/2017   A/P: Likely stable-do need to repeat lipids with next visit when does not have acute infection    Essential hypertension S: controlled on Cartia XT 120mg , carvedilol 25 mg BID,  Lasix 40mg  BID, telmisartan 20mg  BP Readings from Last 3 Encounters:  09/12/18 122/64  08/25/18 124/60  08/23/18 (!) 132/50  A/P: We discussed blood pressure goal of <140/90. Continue current meds    History of osteomyelitis I do want to be noted that patient has a history of osteomyelitis- was followed by infectious disease dating back to 2016.  She is high risk for infection particularly given her diabetic neuropathy chronic prednisone, numerous comorbidities.  Sarcoidosis of lung ([place]) She will need clearance from pulmonary for pulmonary sarcoidosis  Hypertensive heart disease with chronic diastolic congestive heart failure ([place]) She will need clearance from cardiology-she remains on Lasix 20 mg twice daily  Type II diabetes mellitus with renal manifestations ([place]) She will need clearance from endocrinology for this  Cough Sinus pressure S: last visit was seen with cough/wheeze- had not improved with initial round of prednisone/augmentin but then given another round of prednisone by [facility] pulmonary- she saw me day after starting prednisone but we opted to wait another day to see how  she would do on prednisone- fortunately she improved and never had to take the doxycycline (she did pick it up and has this at home still).   Current symptoms started 2-3 days ago. Feels some sore throat, increased cough. Last year had bad experience going to the beach and getting really ill- she leaves for the beach on Friday. Not really wheezing with current illness at present. Tussin cough syrup at night has helped her sleep (knows not to drive for 8 hours after use).   No shortness of breath or fever. No nausea or vomiting. Using her regular inhalers.   A/P:From AVS:  " This looks like an upper respiratory infection and mild viral sinus infection.  Since you have already taken 3 doses of Augmentin-go  ahead and finish the other 3 though.  Then I want you to monitor over the next few days 1.  If symptoms significantly worsen from sinus pressure and discharge perspective- may start the doxycycline previously prescribed 2.  If wheeze and shortness of breath start-please start the prednisone instead.  We are primarily doing this in case symptoms/issues happen over the weekend with you out of town-but if you are trying to make a decision during the week I would love to be kept in the loop"  Patient typically comes in for as needed acute visits-we need to get her scheduled for a regular checkup Future Appointments  Date Time Provider [place]  09/13/2018 10:20 AM Leonie Man, MD [facility]  10/25/2018  8:15 AM Renato Shin, MD [facility] None   Meds ordered this encounter  Medications  . predniSONE (DELTASONE) 20 MG tablet    Sig: Take 2 pills for 3 days, 1 pill for 4 days    Dispense:  10 tablet    Refill:  0   Return precautions advised.  Garret Reddish, MD

## 2018-09-12 NOTE — Assessment & Plan Note (Signed)
She will need clearance from endocrinology for this

## 2018-09-12 NOTE — Assessment & Plan Note (Signed)
S: controlled on Cartia XT 120mg , carvedilol 25 mg BID,  Lasix 40mg  BID, telmisartan 20mg  BP Readings from Last 3 Encounters:  09/12/18 122/64  08/25/18 124/60  08/23/18 (!) 132/50  A/P: We discussed blood pressure goal of <140/90. Continue current meds

## 2018-09-12 NOTE — Assessment & Plan Note (Signed)
I do want to be noted that patient has a history of osteomyelitis- was followed by infectious disease dating back to 2016.  She is high risk for infection particularly given her diabetic neuropathy chronic prednisone, numerous comorbidities.

## 2018-09-12 NOTE — Patient Instructions (Addendum)
This looks like an upper respiratory infection and mild viral sinus infection.  Since you have already taken 3 doses of Augmentin-go ahead and finish the other 3 though.  Then I want you to monitor over the next few days 1.  If symptoms significantly worsen from sinus pressure and discharge perspective- may start the doxycycline previously prescribed 2.  If wheeze and shortness of breath start-please start the prednisone instead.  We are primarily doing this in case symptoms/issues happen over the weekend with you out of town-but if you are trying to make a decision during the week I would love to be kept in the loop

## 2018-09-12 NOTE — Assessment & Plan Note (Signed)
S:  controlled on Atorvastatin 40mg , Tricor 145mg  in the past Lab Results  Component Value Date   CHOL 163 03/10/2017   HDL 35 (L) 03/10/2017   LDLCALC 49 03/10/2017   LDLDIRECT 76.0 01/15/2016   TRIG 197 (H) 09/20/2017   CHOLHDL 4.7 03/10/2017   A/P: Likely stable-do need to repeat lipids with next visit when does not have acute infection

## 2018-09-12 NOTE — Assessment & Plan Note (Signed)
She will need clearance from cardiology-she remains on Lasix 20 mg twice daily

## 2018-09-13 ENCOUNTER — Ambulatory Visit (INDEPENDENT_AMBULATORY_CARE_PROVIDER_SITE_OTHER): Payer: BLUE CROSS/BLUE SHIELD | Admitting: Cardiology

## 2018-09-13 ENCOUNTER — Encounter: Payer: Self-pay | Admitting: Cardiology

## 2018-09-13 DIAGNOSIS — I5032 Chronic diastolic (congestive) heart failure: Secondary | ICD-10-CM

## 2018-09-13 DIAGNOSIS — I110 Hypertensive heart disease with heart failure: Secondary | ICD-10-CM | POA: Diagnosis not present

## 2018-09-13 DIAGNOSIS — R002 Palpitations: Secondary | ICD-10-CM | POA: Diagnosis not present

## 2018-09-13 DIAGNOSIS — Z01818 Encounter for other preprocedural examination: Secondary | ICD-10-CM | POA: Diagnosis not present

## 2018-09-13 NOTE — Progress Notes (Signed)
PCP: [name], MD  Pulmonolgy: Dr. Nori Riis [facility]) ;  - Dr. Concepcion Living  Clinic Note: Chief Complaint  Patient presents with  . Follow-up    2 months.  . Palpitations    Monitor results.  Symptoms improved    HPI: [name] is a 57 y.o. female with a PMH below who presents today for 6 month follow-up for her mild diastolic heart failure/hypertensive heart disease with pulmonary sarcoidosis.  --> She has a history of gastric bypass surgery as well as hypertension, hyperlipidemia, diabetes mellitus,-type II and COPD.  -- She was initially seen back in 2014 and had a RIGHT AND LEFT HEART CATH --> angiographic normal coronary arteries with essentially normal right heart pressures..  She had previously had a negative cath in 2011.   Follow-up cath in 2018 -angiographically normal with mild nonobstructive disease in the LAD.  Normal LVEDP.  [name] was last seen on July 12, 2018 with rapid palpitations and continued chest pain.  Was evaluated with a monitor.  Recent Hospitalizations:   NONE  Studies Reviewed:   Cardiac Monitor:  Mostly sinus rhythm: Rates 39 to 120 bpm. Average 79 bpm.  Roughly 2% PVCs and less than 1% PACs.  No arrhythmias: No A. fib, PAT, MAT, SVT or VT.  Interval History: [name] returns today stating that her palpitations seem to have been much resolved.  Should not really having them at all anymore.  They seem to come and go and brief little spurts.  But she was very happy to hear the results of the monitor.  She has not had any more the fast heart rate spells and has not had any syncope or near syncope.  No TIA or amaurosis fugax.  Usual symptoms.  Not noticing any chest tightness or pressure.  Just this sharp stabbing similar symptoms have been evaluated.  Stable respiratory symptoms with pretty well controlled dyspnea.  No change from baseline.  Although she does not sleep flat as a matter of chronic comfort and dyspnea from her  lung disease and obesity, she has not had any other heart failure symptoms.  She has intermittent trivial edema.  She does have exertional dyspnea, but this is no different from her routine baseline. No melena, hematochezia, hematuria or epistaxis despite nasal congestion.  No claudication  ROS: A comprehensive was performed. Review of Systems  Constitutional: Negative for chills, fever and malaise/fatigue (Seems to have recovered from her URI that she had when I last saw her).  HENT: Negative for congestion and nosebleeds.   Respiratory: Positive for cough (Notably improving) and shortness of breath (Baseline). Negative for sputum production.   Cardiovascular:       Per history of present illness  Gastrointestinal: Negative for abdominal pain, blood in stool and melena.  Genitourinary: Negative for hematuria.  Musculoskeletal: Positive for joint pain (Postop knees are stiff, but not painful).  Skin: Negative for rash.  Neurological: Negative.  Negative for dizziness, tingling and focal weakness.  Psychiatric/Behavioral: Negative for memory loss. The patient is not nervous/anxious and does not have insomnia.   All other systems reviewed and are negative.   Past Medical History:  Diagnosis Date  . Abnormal SPEP 04/17/2014  . Acute left ankle pain 01/26/2017  . ANEMIA-UNSPECIFIED 09/18/2009  . Anxiety   . Arthritis   . Chronic diastolic heart failure, NYHA class 2 (HCC)    Normal LVEDP by May 2018  . COPD (chronic obstructive pulmonary disease) ([place])   . Depression   .  DIABETES MELLITUS, TYPE II 08/21/2006  . Diabetic osteomyelitis ([place]) 05/29/2015  . Fracture of 5th metatarsal    non union  . GERD 08/21/2006  . GOUT 08/20/2010  . Hx of umbilical hernia repair   . HYPERLIPIDEMIA 08/21/2006  . HYPERTENSION 08/21/2006  . Infection of wound due to methicillin resistant Staphylococcus aureus (MRSA)   . Internal hemorrhoids   . Multiple allergies 10/14/2016  . OBESITY 06/04/2009  .  Onychomycosis 10/27/2015  . Osteomyelitis of left foot ([place]) 05/29/2015  . Pulmonary sarcoidosis ([place])    Followed locally by pulmonology, but also by Dr. Casper Harrison at [facility]  . Right knee pain 01/26/2017  . Vocal cord dysfunction   . Wears partial dentures     Past Surgical History:  Procedure Laterality Date  . ABDOMINAL HYSTERECTOMY    . APPENDECTOMY    . BLADDER SUSPENSION  11/11/2011   Procedure: TRANSVAGINAL TAPE (TVT) PROCEDURE;  Surgeon: Olga Millers, MD;  Location: [place] [facility];  Service: Gynecology;  Laterality: N/A;  . CAROTIDS  02/18/11   CAROTID DUPLEX; VERTEBRALS ARE PATENT WITH ANTEGRADE FLOW. ICA/CCA RATIO 1.61 ON RIGHT AND 0.75 ON LEFT  . CHOLECYSTECTOMY  1984  . CYSTOSCOPY  11/11/2011   Procedure: CYSTOSCOPY;  Surgeon: Olga Millers, MD;  Location: [place] [facility];  Service: Gynecology;  Laterality: N/A;  . EXTUBATION (ENDOTRACHEAL) IN OR N/A 09/23/2017   Procedure: EXTUBATION (ENDOTRACHEAL) IN OR;  Surgeon: Helayne Seminole, MD;  Location: [place];  Service: ENT;  Laterality: N/A;  . FIBEROPTIC LARYNGOSCOPY AND TRACHEOSCOPY N/A 09/23/2017   Procedure: FLEXIBLE FIBEROPTIC LARYNGOSCOPY;  Surgeon: Helayne Seminole, MD;  Location: [place];  Service: ENT;  Laterality: N/A;  . FRACTURE SURGERY     foot  . HERNIA REPAIR    . I&D EXTREMITY Left 06/27/2015   Procedure: Partial Excision Left Calcaneus, Place Antibiotic Beads, and Wound VAC;  Surgeon: Newt Minion, MD;  Location: [place];  Service: Orthopedics;  Laterality: Left;  . KNEE ARTHROSCOPY     right  . LEFT AND RIGHT HEART CATHETERIZATION WITH CORONARY ANGIOGRAM N/A 04/23/2013   Procedure: LEFT AND RIGHT HEART CATHETERIZATION WITH CORONARY ANGIOGRAM;  Surgeon: Leonie Man, MD;  Location: [facility];  Service: Cardiovascular;  Laterality: N/A;  . LEFT HEART CATH AND CORONARY ANGIOGRAPHY N/A 03/11/2017   Procedure: Left Heart Cath and Coronary Angiography;  Surgeon: Sherren Mocha, MD;  Location: [place] CV  LAB; angiographically minimal CAD in the LAD otherwise normal.;  Normal LVEDP.  FALSE POSITIVE MYOVIEW  . LEFT HEART CATH AND CORONARY ANGIOGRAPHY  07/20/2010   LVEF 50-55% WITH VERY MILD GLOBAL HYPOKINESIA; ESSENTIALLY NORMAL CORONARY ARTERIES; NORMAL LV FUNCTION  . METATARSAL OSTEOTOMY WITH OPEN REDUCTION INTERNAL FIXATION (ORIF) METATARSAL WITH FUSION Left 04/09/2014   Procedure: LEFT FOOT FRACTURE OPEN TREATMENT METATARSAL INCLUDES INTERNAL FIXATION EACH;  Surgeon: Lorn Junes, MD;  Location: [place];  Service: Orthopedics;  Laterality: Left;  . NISSEN FUNDOPLICATION  3016  . NM MYOVIEW LTD  03/09/2013   Lexiscan --> EF 50%; NORMAL MYOCARDIAL PERFUSION STUDY - breast attenuation  . NM MYOVIEW LTD  03/10/2017   : Moderate size "stress-induced "perfusion defect at the apex as well as "ill-defined stress-induced perfusion defect in the lateral wall.  EF 55%.  INTERMEDIATE risk. -->  FALSE POSITIVE  . Right and left CARDIAC CATHETERIZATION  04/23/2013   Angiographic normal coronaries; LVEDP 20 mmHg, PCWP 12-14 mmHg, RAP 12 mmHg.; Fick CO/CI 4.9/2.2  .  TOTAL KNEE ARTHROPLASTY Right 06/29/2017   Procedure: RIGHT TOTAL KNEE ARTHROPLASTY;  Surgeon: Newt Minion, MD;  Location: [place];  Service: Orthopedics;  Laterality: Right;  . TOTAL KNEE ARTHROPLASTY Left 12/07/2017  . TOTAL KNEE ARTHROPLASTY Left 12/07/2017   Procedure: LEFT TOTAL KNEE ARTHROPLASTY;  Surgeon: Newt Minion, MD;  Location: [place];  Service: Orthopedics;  Laterality: Left;  . TRACHEOSTOMY TUBE PLACEMENT N/A 09/20/2017   Procedure: AWAKE INTUBATION WITH ANESTHESIA WITH VIDEO ASSISTANCE;  Surgeon: Helayne Seminole, MD;  Location: [place] [facility];  Service: ENT;  Laterality: N/A;  . TRANSTHORACIC ECHOCARDIOGRAM  08/2014   Normal LV size and function.  Mild LVH.  EF 55-60%.  Normal regional wall motion.  GR 1 DD.  Normal RV size and function .  [name] TUBAL LIGATION     with reversal in 1994  . VENTRAL HERNIA REPAIR        Current Meds  Medication Sig  . albuterol (PROVENTIL HFA;VENTOLIN HFA) 108 (90 Base) MCG/ACT inhaler Inhale 1-2 puffs into the lungs every 6 (six) hours as needed for wheezing or shortness of breath.  . allopurinol (ZYLOPRIM) 100 MG tablet Take 100 mg by mouth 2 (two) times daily.   [name] aspirin EC 81 MG tablet Take 81 mg by mouth daily.   [name] atorvastatin (LIPITOR) 40 MG tablet TAKE 1 TAB BY MOUTH ONCE DAILY  . BAYER MICROLET LANCETS lancets Use as instructed to check sugar 3 times daily  . buPROPion (WELLBUTRIN XL) 300 MG 24 hr tablet Take 1 tablet (300 mg total) by mouth daily.  . Calcium Citrate 200 MG TABS Take 200 mg by mouth daily.  . carvedilol (COREG) 25 MG tablet TAKE 1 TABLET (25 MG TOTAL) BY MOUTH 2 (TWO) TIMES DAILY WITH A MEAL.  . chlorpheniramine-HYDROcodone ([place]) 10-8 MG/5ML SUER   . Cholecalciferol (VITAMIN D) 2000 units tablet Take 2,000 Units by mouth daily.  . Cyanocobalamin (VITAMIN B-12 PO) Take 2,500 mcg by mouth daily.   [name] DEXILANT 60 MG capsule TAKE 1 CAPSULE BY MOUTH EVERY DAY  . diltiazem (CARDIZEM CD) 120 MG 24 hr capsule TAKE 1 CAPSULE BY MOUTH EVERY DAY (FOR FUTURE REFILLS MAKE A DOCTORS VISIT)  . fenofibrate (TRICOR) 145 MG tablet TAKE 1 TABLET BY MOUTH EVERY DAY  . fluticasone (FLONASE) 50 MCG/ACT nasal spray PLACE 2 SPRAYS INTO BOTH NOSTRILS DAILY AS NEEDED FOR ALLERGIES  . fluticasone furoate-vilanterol (BREO ELLIPTA) 200-25 MCG/INH AEPB Inhale 1 puff into the lungs daily.  . furosemide (LASIX) 40 MG tablet TAKE 1 TABLET BY MOUTH TWICE A DAY  . gabapentin (NEURONTIN) 100 MG capsule TAKE 1 CAPSULE BY MOUTH THREE TIMES A DAY  . glucose blood (BAYER CONTOUR TEST) test strip Use as instructed to check sugar 3 times daily  . insulin aspart (NOVOLOG FLEXPEN) 100 UNIT/ML FlexPen Inject 40-45 Units into the skin 3 (three) times daily with meals.  . Insulin Glargine, 1 Unit Dial, (TOUJEO SOLOSTAR) 300 UNIT/ML SOPN Inject 25 Units into the skin at bedtime.  .  Insulin Pen Needle 33G X 4 MM MISC 1 each by Does not apply route 4 (four) times daily.  [name] ipratropium (ATROVENT) 0.06 % nasal spray Place 2 sprays into both nostrils 4 (four) times daily.  . isosorbide mononitrate (IMDUR) 30 MG 24 hr tablet Take 1 tablet (30 mg total) by mouth at bedtime.  [name] KLOR-CON M20 20 MEQ tablet TAKE 1 & 1/2 TABLETS BY MOUTH TWICE A DAY  . linaclotide (LINZESS) [place]  capsule Take 1 capsule (290 mcg total) by mouth daily before breakfast.  . LORazepam (ATIVAN) 0.5 MG tablet Take 0.5 mg by mouth every 12 (twelve) hours as needed for anxiety.  . metFORMIN (GLUCOPHAGE) 1000 MG tablet TAKE 1 TABLET BY MOUTH TWICE A DAY WITH A MEAL  . nitroGLYCERIN (NITROSTAT) 0.4 MG SL tablet Place 1 tablet (0.4 mg total) under the tongue every 5 (five) minutes as needed for chest pain. Reported on 01/05/2016  . ondansetron (ZOFRAN-ODT) 4 MG disintegrating tablet Take 1 tablet (4 mg total) by mouth every 8 (eight) hours as needed for nausea or vomiting.  [name] oxyCODONE-acetaminophen (PERCOCET/ROXICET) 5-325 MG tablet Take 1 tablet by mouth every 4 (four) hours as needed for severe pain.  [name] PARoxetine Mesylate (BRISDELLE) 7.5 MG CAPS Take 7.5 mg by mouth daily.  . Potassium Chloride ER 20 MEQ TBCR TAKE ONE AND ONE-HALF TABLETS BY MOUTH 2 TIMES DAILY  . predniSONE (DELTASONE) 5 MG tablet Take 5 mg by mouth daily with breakfast.  . predniSONE (DELTASONE) 50 MG tablet Take 1 tablet daily  . telmisartan (MICARDIS) 20 MG tablet TAKE 1 TABLET BY MOUTH EVERY DAY  . TOUJEO SOLOSTAR 300 UNIT/ML SOPN INJECT 40 UNITS INTO THE SKIN AT BEDTIME  . triamcinolone cream (KENALOG) 0.1 % Apply 1 application topically 2 (two) times daily as needed (for rash. stop after 7-10 days.).  [name] venlafaxine XR (EFFEXOR-XR) 75 MG 24 hr capsule Take 1 capsule (75 mg total) by mouth 2 (two) times daily.  . Vitamin D-Vitamin K (K2 PLUS D3) 2053885350 MCG-UNIT TABS cholecalciferol (vit D3) 1,000 unit-vitamin K2 (MK4) 100 mcg  tablet  Take by oral route.    Allergies  Allergen Reactions  . Methotrexate Other (See Comments)    Peri-oral and buccal lesions  . Vancomycin Other (See Comments)    DOSE RELATED NEPHROTOXICITY  . Lisinopril Cough  . Chlorhexidine Itching  . Clindamycin/Lincomycin Nausea And Vomiting and Rash    RASH > ALLERGY INTOLERANCE > NAUSEA & VOMITING  . Doxycycline Rash  . Teflaro [Ceftaroline] Rash    Tolerates ceftriaxone     Social History   Tobacco Use  . Smoking status: Never Smoker  . Smokeless tobacco: Never Used  Substance Use Topics  . Alcohol use: No    Alcohol/week: 0.0 standard drinks  . Drug use: No   Social History   Social History Narrative   Married 1994. 2 sons who both live close and 1 grandson.       Disability due to sarcoidosis. 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  Wt Readings from Last 3 Encounters:  09/13/18 259 lb (117.5 kg)  09/12/18 266 lb 9.6 oz (120.9 kg)  08/25/18 253 lb 9.6 oz (115 kg)    PHYSICAL EXAM BP (!) 130/50 (BP Location: Left Arm, Patient Position: Sitting, Cuff Size: Large)   Pulse 81   Ht 5\' 6"  (1.676 m)   Wt  259 lb (117.5 kg)   BMI 41.80 kg/m   Physical Exam  Constitutional: She is oriented to person, place, and time. She appears well-developed and well-nourished. No distress.  Essentially morbidly obese with some mild cushingoid features--pretty much  chronically ill-appearing, but nontoxic.  Well-groomed.  In good spirits.  HENT:  Head: Normocephalic and atraumatic.  Neck: Normal range of motion. Neck supple. No hepatojugular reflux and no JVD present. Carotid bruit is not present.  Cardiovascular: Normal rate, regular rhythm, normal heart sounds and intact distal pulses. Exam reveals no gallop and no friction rub.  No murmur heard. Pulmonary/Chest: Effort normal. No respiratory distress (Baseline accessory muscle use, but not in any distress.). She exhibits no tenderness.  Generalized diminished breath sounds, likely related to body habitus.  Diffuse crackles but no rales or rhonchi.  Rare expiratory wheeze.  Abdominal: Soft. Bowel sounds are normal. She exhibits no distension. There is no tenderness.  Unable to palpate HSM  Musculoskeletal: Normal range of motion. She exhibits no edema.  Neurological: She is alert and oriented to person, place, and time.  Psychiatric: She has a normal mood and affect. Her behavior is normal. Judgment and thought content normal.  Nursing note and vitals reviewed.   Adult ECG Report n/a  Other studies Reviewed: Additional studies/ records that were reviewed today include:  Recent Labs:  Checked by PCP  Lab Results  Component Value Date   CHOL 163 03/10/2017   HDL 35 (L) 03/10/2017   LDLCALC 49 03/10/2017   LDLDIRECT 76.0 01/15/2016   TRIG 197 (H) 09/20/2017   CHOLHDL 4.7 03/10/2017    ASSESSMENT / PLAN: Problem List Items Addressed This Visit    Hypertensive heart disease with chronic diastolic congestive heart failure (HCC) (Chronic)    Stable.  She does not really have true diastolic heart failure.  She has diastolic dysfunction, but most of her dyspnea and respiratory issues are related to her underlying lung disease.  Is on low-dose Lasix and stable. Does not need any further cardiac evaluation for upcoming surgery.  She has had an ischemic evaluation cardiogram recently.  Symptoms are  stable.  Cardiac monitor did not show any arrhythmias.  No further testing required.  From a cardiac standpoint would be low risk low risk surgery, however from a pulmonary standpoint may be more concerned.      Preoperative clearance    From a cardiology standpoint, she has diabetes on insulin but no history of stroke, normal renal function, no active cardiac symptoms.  An upcoming surgery is low risk.  She would be a low to intermediate risk patient based on diabetes alone --> from a cardiology standpoint.  However from a pulmonary standpoint she does have asthmatic bronchitis and sarcoidosis which has been stable      Rapid palpitations    She really is not having any more the symptoms now.  Tachycardia seems to be resolved. Monitor was essentially normal.  She is on stable dose of carvedilol with no documented evidence of arrhythmia.         Current medicines are reviewed at length with the patient today. (+/- concerns) n/a The following changes have been made: n/a  Patient Instructions  Medication Instructions:  NOT NEEDED If you need a refill on your cardiac medications before your next appointment, please call your pharmacy.   Lab work: NOT NEEDED If you have labs (blood work) drawn today and your tests are completely normal, you will receive your results only by: [name] MyChart  Message (if you have MyChart) OR . A paper copy in the mail If you have any lab test that is abnormal or we need to change your treatment, we will call you to review the results.  Testing/Procedures: NOT NEEDED  Follow-Up: At [facility], you and your health needs are our priority.  As part of our continuing mission to provide you with exceptional heart care, we have created designated Provider Care Teams.  These Care Teams include your primary Cardiologist (physician) and Advanced Practice Providers (APPs -  Physician Assistants and Nurse Practitioners) who all work together to provide you with  the care you need, when you need it. You will need a follow up appointment in 12 months.  Please call our office 2 months in advance to schedule this appointment.  You may see [name], MD or one of the following Advanced Practice Providers on your designated Care Team:   Rosaria Ferries, PA-C . Jory Sims, DNP, ANP  Any Other Special Instructions Will Be Listed Below (If Applicable). NOT NEEDED    Studies Ordered:   No orders of the defined types were placed in this encounter.     [name], M.D., M.S. Interventional Cardiologist   Pager # [phone] Phone # [phone] [address] [place], [place] [address]

## 2018-09-13 NOTE — Patient Instructions (Signed)
Medication Instructions:  NOT NEEDED If you need a refill on your cardiac medications before your next appointment, please call your pharmacy.   Lab work: NOT NEEDED If you have labs (blood work) drawn today and your tests are completely normal, you will receive your results only by: . MyChart Message (if you have MyChart) OR . A paper copy in the mail If you have any lab test that is abnormal or we need to change your treatment, we will call you to review the results.  Testing/Procedures: NOT NEEDED  Follow-Up: At [facility], you and your health needs are our priority.  As part of our continuing mission to provide you with exceptional heart care, we have created designated Provider Care Teams.  These Care Teams include your primary Cardiologist (physician) and Advanced Practice Providers (APPs -  Physician Assistants and Nurse Practitioners) who all work together to provide you with the care you need, when you need it. You will need a follow up appointment in 12 months.  Please call our office 2 months in advance to schedule this appointment.  You may see David Harding, MD or one of the following Advanced Practice Providers on your designated Care Team:   Rhonda Barrett, PA-C . Kathryn Lawrence, DNP, ANP  Any Other Special Instructions Will Be Listed Below (If Applicable). NOT NEEDED   

## 2018-09-13 NOTE — Telephone Encounter (Signed)
I am returning your call.  You wanted to schedule your surgery?  Yes, I do."  Do you have a date in mine?  "I'd like to do it as soon as possible but I am going out of town on Friday and I won't be back until Monday."  Dr. Amalia Hailey can do it on Monday, December 23 at [facility].  "That will be fine.  What time will I need to be there?"  Someone from the surgical center will probably call you the Friday before and they will give you your arrival time.  Have you had your forms filled out by your primary care physician, [place] requires it?  "I saw my doctor yesterday.  He's supposed to be faxing you the information."  I'll get you scheduled for the surgery.

## 2018-09-15 ENCOUNTER — Telehealth: Payer: Self-pay | Admitting: *Deleted

## 2018-09-15 NOTE — Telephone Encounter (Signed)
"  I'm calling for my wife, Seth Friedlander.  Dr. Amalia Hailey is going to do some Mardelle Matte on her in December on both feet at the same time.  She's concerned about that as far as getting around.  What kind of recovery is required after having the Hammer Toe Surgery?  If the nurse or someone could call in reference to doing one foot at time versus both feet at the same time, the recovery process."

## 2018-09-16 ENCOUNTER — Encounter: Payer: Self-pay | Admitting: Cardiology

## 2018-09-16 NOTE — Assessment & Plan Note (Signed)
From a cardiology standpoint, she has diabetes on insulin but no history of stroke, normal renal function, no active cardiac symptoms.  An upcoming surgery is low risk.  She would be a low to intermediate risk patient based on diabetes alone --> from a cardiology standpoint.  However from a pulmonary standpoint she does have asthmatic bronchitis and sarcoidosis which has been stable

## 2018-09-16 NOTE — Assessment & Plan Note (Signed)
Stable.  She does not really have true diastolic heart failure.  She has diastolic dysfunction, but most of her dyspnea and respiratory issues are related to her underlying lung disease.  Is on low-dose Lasix and stable. Does not need any further cardiac evaluation for upcoming surgery.  She has had an ischemic evaluation cardiogram recently.  Symptoms are stable.  Cardiac monitor did not show any arrhythmias.  No further testing required.  From a cardiac standpoint would be low risk low risk surgery, however from a pulmonary standpoint may be more concerned.

## 2018-09-16 NOTE — Assessment & Plan Note (Signed)
She really is not having any more the symptoms now.  Tachycardia seems to be resolved. Monitor was essentially normal.  She is on stable dose of carvedilol with no documented evidence of arrhythmia.

## 2018-09-18 NOTE — Telephone Encounter (Signed)
Thank you for letting me know. I will also inform Delydia of this.

## 2018-09-18 NOTE — Telephone Encounter (Signed)
Called and spoke with [name] at [facility] scheduling. Told her pt only wants Hammertoe repair 2-5 Left foot only. [name] stated she got it changed to left only.

## 2018-09-18 NOTE — Telephone Encounter (Signed)
I spoke with pt and informed pt doing one foot at a time did allow for her to allow the surgery foot to rest and not be weight bearing every time she had to get up, that she could use a knee scooter in she needed. Pt states she has surgery 10/02/2018 and thinks she would like to do the left foot 1st and schedule the right later.

## 2018-09-20 ENCOUNTER — Other Ambulatory Visit: Payer: Self-pay

## 2018-09-20 MED ORDER — DEXLANSOPRAZOLE 60 MG PO CPDR: 60.0000 mg | DELAYED_RELEASE_CAPSULE | Freq: Every day | ORAL | 5 refills | Status: DC

## 2018-09-26 ENCOUNTER — Ambulatory Visit: Payer: BLUE CROSS/BLUE SHIELD | Admitting: Family Medicine

## 2018-09-26 NOTE — Progress Notes (Signed)
Please place orders in epic pt. Has a preop appt. 09-27-18 Thank You

## 2018-09-26 NOTE — Patient Instructions (Signed)
MIRELLA GUEYE  09/26/2018   Your procedure is scheduled on: 10-02-18  Report to [facility] Main  Entrance             Report to admitting at              230 PM    Call this number if you have problems the morning of surgery [phone]    Remember: Do not eat food:After Midnight  You may have clear liquids until 1030 am then nothing by mouth    CLEAR LIQUID DIET   Foods Allowed                                                                     Foods Excluded  Coffee and tea, regular and decaf                             liquids that you cannot  Plain Jell-O in any flavor                                             see through such as: Fruit ices (not with fruit pulp)                                     milk, soups, orange juice  Iced Popsicles                                    All solid food Carbonated beverages, regular and diet                                    Cranberry, grape and apple juices Sports drinks like Gatorade Lightly seasoned clear broth or consume(fat free) Sugar, honey syrup  Sample Menu Breakfast                                Lunch                                     Supper Cranberry juice                    Beef broth                            Chicken broth Jell-O                                     Grape juice  Apple juice Coffee or tea                        Jell-O                                      Popsicle                                                Coffee or tea                        Coffee or tea  _____________________________________________________________________    . BRUSH YOUR TEETH MORNING OF SURGERY AND RINSE YOUR MOUTH OUT, NO CHEWING GUM CANDY OR MINTS.     Take these medicines the morning of surgery with A SIP OF WATER: effexor, paroxentine, gabapentin, inhalers and bring, dexilant, fenofibrate, diltiazem, carvedilol, wellbutrin,  lipitor, allopurinol DO NOT TAKE ANY  DIABETIC MEDICATIONS DAY OF YOUR SURGERY                               You may not have any metal on your body including hair pins and              piercings  Do not wear jewelry, make-up, lotions, powders or perfumes, deodorant             Do not wear nail polish.  Do not shave  48 hours prior to surgery.        Do not bring valuables to the hospital. [place].  Contacts, dentures or bridgework may not be worn into surgery.  Leave suitcase in the car. After surgery it may be brought to your room.     Patients discharged the day of surgery will not be allowed to drive home.  Name and phone number of your driver:  Special Instructions: N/A              Please read over the following fact sheets you were given: _____________________________________________________________________  How to Manage Your Diabetes Before and After Surgery  Why is it important to control my blood sugar before and after surgery? . Improving blood sugar levels before and after surgery helps healing and can limit problems. . A way of improving blood sugar control is eating a healthy diet by: o  Eating less sugar and carbohydrates o  Increasing activity/exercise o  Talking with your doctor about reaching your blood sugar goals . High blood sugars (greater than 180 mg/dL) can raise your risk of infections and slow your recovery, so you will need to focus on controlling your diabetes during the weeks before surgery. . Make sure that the doctor who takes care of your diabetes knows about your planned surgery including the date and location.  How do I manage my blood sugar before surgery? . Check your blood sugar at least 4 times a day, starting 2 days before surgery, to make sure that the level is not too high or low. o Check your blood sugar the morning of your surgery when you wake up and  every 2 hours until you get to the Short Stay unit. . If your blood sugar  is less than 70 mg/dL, you will need to treat for low blood sugar: o Do not take insulin. o Treat a low blood sugar (less than 70 mg/dL) with  cup of clear juice (cranberry or apple), 4 glucose tablets, OR glucose gel. o Recheck blood sugar in 15 minutes after treatment (to make sure it is greater than 70 mg/dL). If your blood sugar is not greater than 70 mg/dL on recheck, call [phone] for further instructions. . Report your blood sugar to the short stay nurse when you get to Short Stay.  . If you are admitted to the hospital after surgery: o Your blood sugar will be checked by the staff and you will probably be given insulin after surgery (instead of oral diabetes medicines) to make sure you have good blood sugar levels. o The goal for blood sugar control after surgery is 80-180 mg/dL.   WHAT DO I DO ABOUT MY DIABETES MEDICATION?  [name] Do not take oral diabetes medicines (pills) the morning of surgery.  . THE NIGHT BEFORE SURGERY, take   50%  of   Your lantus insulin dose    insulin.       . THE MORNING OF SURGERY, take     0   units of         Insulin unless blood sugar > 220 see below  . The day of surgery, do not take other diabetes injectables, including Byetta (exenatide), Bydureon (exenatide ER), Victoza (liraglutide), or Trulicity (dulaglutide).  . If your CBG is greater than 220 mg/dL, you may take  of your sliding scale  . (correction) dose of insulin.  Patient Signature:  Date:   Nurse Signature:  Date:   Reviewed and Endorsed by [facility] Patient Education Committee, August 2015           [facility] - Preparing for Surgery Before surgery, you can play an important role.  Because skin is not sterile, your skin needs to be as free of germs as possible.  You can reduce the number of germs on your skin by washing with CHG (chlorahexidine gluconate) soap before surgery.  CHG is an antiseptic cleaner which kills germs and bonds with the skin to continue killing germs  even after washing. Please DO NOT use if you have an allergy to CHG or antibacterial soaps.  If your skin becomes reddened/irritated stop using the CHG and inform your nurse when you arrive at Short Stay. Do not shave (including legs and underarms) for at least 48 hours prior to the first CHG shower.  You may shave your face/neck. Please follow these instructions carefully:  1.  Shower with CHG Soap the night before surgery and the  morning of Surgery.  2.  If you choose to wash your hair, wash your hair first as usual with your  normal  shampoo.  3.  After you shampoo, rinse your hair and body thoroughly to remove the  shampoo.                           4.  Use CHG as you would any other liquid soap.  You can apply chg directly  to the skin and wash                       Gently with a scrungie or clean  washcloth.  5.  Apply the CHG Soap to your body ONLY FROM THE NECK DOWN.   Do not use on face/ open                           Wound or open sores. Avoid contact with eyes, ears mouth and genitals (private parts).                       Wash face,  Genitals (private parts) with your normal soap.             6.  Wash thoroughly, paying special attention to the area where your surgery  will be performed.  7.  Thoroughly rinse your body with warm water from the neck down.  8.  DO NOT shower/wash with your normal soap after using and rinsing off  the CHG Soap.                9.  Pat yourself dry with a clean towel.            10.  Wear clean pajamas.            11.  Place clean sheets on your bed the night of your first shower and do not  sleep with pets. Day of Surgery : Do not apply any lotions/deodorants the morning of surgery.  Please wear clean clothes to the hospital/surgery center.  FAILURE TO FOLLOW THESE INSTRUCTIONS MAY RESULT IN THE CANCELLATION OF YOUR SURGERY PATIENT SIGNATURE_________________________________  NURSE  SIGNATURE__________________________________  ________________________________________________________________________

## 2018-09-27 ENCOUNTER — Telehealth: Payer: Self-pay | Admitting: Endocrinology

## 2018-09-27 ENCOUNTER — Encounter (HOSPITAL_COMMUNITY): Payer: Self-pay

## 2018-09-27 ENCOUNTER — Encounter (HOSPITAL_COMMUNITY)
Admission: RE | Admit: 2018-09-27 | Discharge: 2018-09-27 | Disposition: A | Discharge status: Home / Self Care | Payer: Medicare Other | Source: Ambulatory Visit | Attending: Podiatry | Admitting: Podiatry

## 2018-09-27 ENCOUNTER — Telehealth: Payer: Self-pay | Admitting: *Deleted

## 2018-09-27 DIAGNOSIS — Z01812 Encounter for preprocedural laboratory examination: Secondary | ICD-10-CM | POA: Diagnosis not present

## 2018-09-27 LAB — CBC
HCT: 34.1 % — ABNORMAL LOW (ref 36.0–46.0)
Hemoglobin: 10.3 g/dL — ABNORMAL LOW (ref 12.0–15.0)
MCH: 26.3 pg (ref 26.0–34.0)
MCHC: 30.2 g/dL (ref 30.0–36.0)
MCV: 87 fL (ref 80.0–100.0)
Platelets: 400 10*3/uL (ref 150–400)
RBC: 3.92 MIL/uL (ref 3.87–5.11)
RDW: 15.1 % (ref 11.5–15.5)
WBC: 13.7 10*3/uL — ABNORMAL HIGH (ref 4.0–10.5)
nRBC: 0 % (ref 0.0–0.2)

## 2018-09-27 LAB — BASIC METABOLIC PANEL
Anion gap: 13 (ref 5–15)
BUN: 16 mg/dL (ref 6–20)
CO2: 27 mmol/L (ref 22–32)
Calcium: 9.4 mg/dL (ref 8.9–10.3)
Chloride: 102 mmol/L (ref 98–111)
Creatinine, Ser: 0.99 mg/dL (ref 0.44–1.00)
GFR calc Af Amer: >60 mL/min (ref >60)
GFR calc non Af Amer: >60 mL/min (ref >60)
Glucose, Bld: 85 mg/dL (ref 70–99)
Potassium: 4.1 mmol/L (ref 3.5–5.1)
Sodium: 142 mmol/L (ref 135–145)

## 2018-09-27 LAB — SURGICAL PCR SCREEN
MRSA, PCR: NEGATIVE
Staphylococcus aureus: POSITIVE — AB

## 2018-09-27 NOTE — Progress Notes (Signed)
Clearance frpm Dr. Loanne Drilling endocrinology on chart 09-27-18 epic

## 2018-09-27 NOTE — Telephone Encounter (Signed)
I am calling you in regards to getting medical clearance from Dr. Loanne Drilling and Dr. Joya Gaskins.  "I have put in a call to Dr. Loanne Drilling requesting the clearance.  I don't see Dr. Joya Gaskins anymore.  I see Dr. Casper Harrison in [place].  I put in a request to him too already.  I asked them to send it to the nurse from [facility] that called me.  I didn't know you needed it from those doctors."  Yes, when you first scheduled your surgery, we had requested medical clearance from Dr. Yong Channel and he said we needed to get clearance from Cardiology, Endocrinology, and Pulmonology.  "Well he didn't tell me.  I'm working on getting it."

## 2018-09-27 NOTE — Telephone Encounter (Signed)
Below clearance from an endocrinology perspective has been faxed to Attention: [facility] @ # [phone]

## 2018-09-27 NOTE — Progress Notes (Signed)
EKG 07-12-18 epic LOV and clearance Dr. Ellyn Hack cardiology 09-13-18 in epic cxr 08-23-18 epic [id number] 08-25-18 in epic  Waiting on pulmonary clearance  From  Dr. Casper Harrison in [place].

## 2018-09-27 NOTE — Telephone Encounter (Signed)
Please advise. I called pt and provided her with instructions re: her insulin dosage pre/post-op. Were you completing surgical clearance as her PCP or clearance from an endocrinology standpoint?

## 2018-09-27 NOTE — Telephone Encounter (Signed)
[name] cleared from the standpoint of the diabetes The night before and the night of, subtract 10 units from your usual amount of toujeo. On the day of surgery, skip novolog until you are eating again.   Then take just half the prescribed amount of novolog for the rest of the day.  Call if questions or problems

## 2018-09-27 NOTE — Telephone Encounter (Signed)
Please refer to Dr. Cordelia Pen clearance message below. Thank you

## 2018-09-27 NOTE — Telephone Encounter (Signed)
Patient is having toe surgery at [facility] has not received clearance from Dr. Loanne Drilling. Please Advise, thanks [employer] # [phone]

## 2018-09-27 NOTE — Telephone Encounter (Signed)
"  I am calling in regards to [facility].  She's scheduled for surgery on December 23.  I am calling to see if you received the Pulmonary and Endocrinology clearance for her surgery."  Yes, we did.  "I see the one for Cardiology but not for the others.  So, we need the pulmonary and endocrinology clearance."  I will find it then fax it   You can fax it to [phone].

## 2018-09-28 ENCOUNTER — Other Ambulatory Visit: Payer: BLUE CROSS/BLUE SHIELD

## 2018-09-28 ENCOUNTER — Other Ambulatory Visit: Payer: Self-pay | Admitting: Endocrinology

## 2018-09-29 NOTE — Telephone Encounter (Signed)
We received the medical clearance from Dr. Casper Harrison, [place].  I faxed it to [facility].

## 2018-09-29 NOTE — Telephone Encounter (Signed)
"  Dr. Casper Harrison did fax an authorization letter for [facility] to have her surgery on the twenty-third.  I just wanted to make sure that you got that.  I did get a confirmation back, so I'm just checking.  Surgery was authorized as long as she is not having an Asthma exacerbation."  I left her a message that we have not received the medical clearance response.  I asked her to fax it to [phone].

## 2018-09-29 NOTE — Progress Notes (Signed)
Waiting on pulmonology clearance from dr Amalia Hailey office, chart taken to short stay.

## 2018-10-01 MED ORDER — DEXTROSE 5 % IV SOLN
3.0000 g | INTRAVENOUS | Status: AC
  Administered 2018-10-02: 3 g via INTRAVENOUS
  Filled 2018-10-01: qty 3

## 2018-10-02 ENCOUNTER — Encounter (HOSPITAL_COMMUNITY): Payer: Self-pay | Admitting: *Deleted

## 2018-10-02 ENCOUNTER — Ambulatory Visit (HOSPITAL_COMMUNITY): Payer: BLUE CROSS/BLUE SHIELD | Admitting: Certified Registered Nurse Anesthetist

## 2018-10-02 ENCOUNTER — Ambulatory Visit (HOSPITAL_COMMUNITY)
Admission: RE | Admit: 2018-10-02 | Discharge: 2018-10-02 | Disposition: A | Discharge status: Home / Self Care | Payer: BLUE CROSS/BLUE SHIELD | Source: Other Acute Inpatient Hospital | Attending: Podiatry | Admitting: Podiatry

## 2018-10-02 ENCOUNTER — Encounter (HOSPITAL_COMMUNITY)
Admission: RE | Disposition: A | Discharge status: Home / Self Care | Payer: Self-pay | Source: Other Acute Inpatient Hospital | Attending: Podiatry

## 2018-10-02 ENCOUNTER — Other Ambulatory Visit: Payer: Self-pay

## 2018-10-02 ENCOUNTER — Telehealth: Payer: Self-pay | Admitting: Podiatry

## 2018-10-02 DIAGNOSIS — Z7982 Long term (current) use of aspirin: Secondary | ICD-10-CM | POA: Diagnosis not present

## 2018-10-02 DIAGNOSIS — I2510 Atherosclerotic heart disease of native coronary artery without angina pectoris: Secondary | ICD-10-CM | POA: Insufficient documentation

## 2018-10-02 DIAGNOSIS — M7752 Other enthesopathy of left foot: Secondary | ICD-10-CM | POA: Diagnosis not present

## 2018-10-02 DIAGNOSIS — F419 Anxiety disorder, unspecified: Secondary | ICD-10-CM | POA: Diagnosis not present

## 2018-10-02 DIAGNOSIS — M205X2 Other deformities of toe(s) (acquired), left foot: Secondary | ICD-10-CM | POA: Diagnosis not present

## 2018-10-02 DIAGNOSIS — E1140 Type 2 diabetes mellitus with diabetic neuropathy, unspecified: Secondary | ICD-10-CM | POA: Diagnosis not present

## 2018-10-02 DIAGNOSIS — Z888 Allergy status to other drugs, medicaments and biological substances status: Secondary | ICD-10-CM | POA: Insufficient documentation

## 2018-10-02 DIAGNOSIS — Z79899 Other long term (current) drug therapy: Secondary | ICD-10-CM | POA: Diagnosis not present

## 2018-10-02 DIAGNOSIS — M2042 Other hammer toe(s) (acquired), left foot: Secondary | ICD-10-CM | POA: Diagnosis not present

## 2018-10-02 DIAGNOSIS — Z8614 Personal history of Methicillin resistant Staphylococcus aureus infection: Secondary | ICD-10-CM | POA: Insufficient documentation

## 2018-10-02 DIAGNOSIS — I5032 Chronic diastolic (congestive) heart failure: Secondary | ICD-10-CM | POA: Diagnosis not present

## 2018-10-02 DIAGNOSIS — Z881 Allergy status to other antibiotic agents status: Secondary | ICD-10-CM | POA: Insufficient documentation

## 2018-10-02 DIAGNOSIS — M2032 Hallux varus (acquired), left foot: Secondary | ICD-10-CM | POA: Insufficient documentation

## 2018-10-02 DIAGNOSIS — Z794 Long term (current) use of insulin: Secondary | ICD-10-CM | POA: Diagnosis not present

## 2018-10-02 DIAGNOSIS — F329 Major depressive disorder, single episode, unspecified: Secondary | ICD-10-CM | POA: Diagnosis not present

## 2018-10-02 DIAGNOSIS — N183 Chronic kidney disease, stage 3 (moderate): Secondary | ICD-10-CM | POA: Insufficient documentation

## 2018-10-02 DIAGNOSIS — Z7951 Long term (current) use of inhaled steroids: Secondary | ICD-10-CM | POA: Insufficient documentation

## 2018-10-02 DIAGNOSIS — D860 Sarcoidosis of lung: Secondary | ICD-10-CM | POA: Insufficient documentation

## 2018-10-02 DIAGNOSIS — R911 Solitary pulmonary nodule: Secondary | ICD-10-CM | POA: Diagnosis not present

## 2018-10-02 DIAGNOSIS — J449 Chronic obstructive pulmonary disease, unspecified: Secondary | ICD-10-CM | POA: Diagnosis not present

## 2018-10-02 DIAGNOSIS — E785 Hyperlipidemia, unspecified: Secondary | ICD-10-CM | POA: Insufficient documentation

## 2018-10-02 DIAGNOSIS — Z7952 Long term (current) use of systemic steroids: Secondary | ICD-10-CM | POA: Insufficient documentation

## 2018-10-02 DIAGNOSIS — I130 Hypertensive heart and chronic kidney disease with heart failure and stage 1 through stage 4 chronic kidney disease, or unspecified chronic kidney disease: Secondary | ICD-10-CM | POA: Insufficient documentation

## 2018-10-02 DIAGNOSIS — E1122 Type 2 diabetes mellitus with diabetic chronic kidney disease: Secondary | ICD-10-CM | POA: Diagnosis not present

## 2018-10-02 DIAGNOSIS — Z6841 Body Mass Index (BMI) 40.0 and over, adult: Secondary | ICD-10-CM | POA: Insufficient documentation

## 2018-10-02 HISTORY — PX: HAMMER TOE SURGERY: SHX385

## 2018-10-02 HISTORY — PX: HALLUX FUSION: SHX6621

## 2018-10-02 LAB — GLUCOSE, CAPILLARY
Glucose-Capillary: 97 mg/dL (ref 70–99)
Glucose-Capillary: 134 mg/dL — ABNORMAL HIGH (ref 70–99)

## 2018-10-02 SURGERY — CORRECTION, HAMMER TOE
Anesthesia: General | Site: Foot | Laterality: Left

## 2018-10-02 MED ORDER — LIDOCAINE HCL 2 % IJ SOLN
INTRAMUSCULAR | Status: AC
  Filled 2018-10-02: qty 20

## 2018-10-02 MED ORDER — OXYCODONE HCL 5 MG PO TABS: 5.0000 mg | ORAL_TABLET | Freq: Once | ORAL | Status: DC | PRN

## 2018-10-02 MED ORDER — ONDANSETRON HCL 4 MG/2ML IJ SOLN
INTRAMUSCULAR | Status: DC | PRN
  Administered 2018-10-02: 4 mg via INTRAVENOUS

## 2018-10-02 MED ORDER — LIDOCAINE-EPINEPHRINE (PF) 2 %-1:200000 IJ SOLN
INTRAMUSCULAR | Status: DC | PRN
  Administered 2018-10-02: 10 mL

## 2018-10-02 MED ORDER — LIDOCAINE 2% (20 MG/ML) 5 ML SYRINGE
INTRAMUSCULAR | Status: DC | PRN
  Administered 2018-10-02: 100 mg via INTRAVENOUS

## 2018-10-02 MED ORDER — ONDANSETRON HCL 4 MG/2ML IJ SOLN: 4.0000 mg | Freq: Once | INTRAMUSCULAR | Status: DC | PRN

## 2018-10-02 MED ORDER — PROPOFOL 10 MG/ML IV BOLUS
INTRAVENOUS | Status: AC
  Filled 2018-10-02: qty 20

## 2018-10-02 MED ORDER — DEXAMETHASONE SODIUM PHOSPHATE 10 MG/ML IJ SOLN
INTRAMUSCULAR | Status: AC
  Filled 2018-10-02: qty 1

## 2018-10-02 MED ORDER — DEXAMETHASONE SODIUM PHOSPHATE 10 MG/ML IJ SOLN
INTRAMUSCULAR | Status: DC | PRN
  Administered 2018-10-02: 5 mg via INTRAVENOUS

## 2018-10-02 MED ORDER — ONDANSETRON HCL 4 MG/2ML IJ SOLN
INTRAMUSCULAR | Status: AC
  Filled 2018-10-02: qty 2

## 2018-10-02 MED ORDER — MEPERIDINE HCL 50 MG/ML IJ SOLN: 6.2500 mg | INTRAMUSCULAR | Status: DC | PRN

## 2018-10-02 MED ORDER — FENTANYL CITRATE (PF) 100 MCG/2ML IJ SOLN: 25.0000 ug | INTRAMUSCULAR | Status: DC | PRN

## 2018-10-02 MED ORDER — 0.9 % SODIUM CHLORIDE (POUR BTL) OPTIME
TOPICAL | Status: DC | PRN
  Administered 2018-10-02: 1000 mL

## 2018-10-02 MED ORDER — OXYCODONE-ACETAMINOPHEN 5-325 MG PO TABS: 1.0000 | ORAL_TABLET | Freq: Four times a day (QID) | ORAL | 0 refills | Status: DC | PRN

## 2018-10-02 MED ORDER — ACETAMINOPHEN 325 MG PO TABS: 325.0000 mg | ORAL_TABLET | ORAL | Status: DC | PRN

## 2018-10-02 MED ORDER — MIDAZOLAM HCL 2 MG/2ML IJ SOLN
INTRAMUSCULAR | Status: AC
  Filled 2018-10-02: qty 2

## 2018-10-02 MED ORDER — POVIDONE-IODINE 7.5 % EX SOLN: Freq: Once | CUTANEOUS | Status: DC

## 2018-10-02 MED ORDER — FENTANYL CITRATE (PF) 100 MCG/2ML IJ SOLN
INTRAMUSCULAR | Status: DC | PRN
  Administered 2018-10-02: 50 ug via INTRAVENOUS
  Administered 2018-10-02: 25 ug via INTRAVENOUS
  Administered 2018-10-02: 50 ug via INTRAVENOUS
  Administered 2018-10-02: 25 ug via INTRAVENOUS

## 2018-10-02 MED ORDER — CEPHALEXIN 500 MG PO CAPS: 500.0000 mg | ORAL_CAPSULE | Freq: Three times a day (TID) | ORAL | 0 refills | Status: AC

## 2018-10-02 MED ORDER — BUPIVACAINE HCL (PF) 0.5 % IJ SOLN
INTRAMUSCULAR | Status: AC
  Filled 2018-10-02: qty 30

## 2018-10-02 MED ORDER — MIDAZOLAM HCL 5 MG/5ML IJ SOLN
INTRAMUSCULAR | Status: DC | PRN
  Administered 2018-10-02: 2 mg via INTRAVENOUS

## 2018-10-02 MED ORDER — PROPOFOL 10 MG/ML IV BOLUS
INTRAVENOUS | Status: DC | PRN
  Administered 2018-10-02: 70 mg via INTRAVENOUS
  Administered 2018-10-02: 50 mg via INTRAVENOUS
  Administered 2018-10-02: 80 mg via INTRAVENOUS

## 2018-10-02 MED ORDER — FENTANYL CITRATE (PF) 100 MCG/2ML IJ SOLN
INTRAMUSCULAR | Status: AC
  Filled 2018-10-02: qty 2

## 2018-10-02 MED ORDER — ACETAMINOPHEN 160 MG/5ML PO SOLN: 325.0000 mg | ORAL | Status: DC | PRN

## 2018-10-02 MED ORDER — LACTATED RINGERS IV SOLN
INTRAVENOUS | Status: DC
  Administered 2018-10-02: 16:00:00 via INTRAVENOUS

## 2018-10-02 MED ORDER — BUPIVACAINE HCL (PF) 0.5 % IJ SOLN
INTRAMUSCULAR | Status: DC | PRN
  Administered 2018-10-02: 10 mL

## 2018-10-02 MED ORDER — OXYCODONE HCL 5 MG/5ML PO SOLN: 5.0000 mg | Freq: Once | ORAL | Status: DC | PRN

## 2018-10-02 SURGICAL SUPPLY — 51 items
BAG SPEC THK2 15X12 ZIP CLS (MISCELLANEOUS) ×1
BAG ZIPLOCK 12X15 (MISCELLANEOUS) ×2 IMPLANT
BANDAGE ACE 4X5 VEL STRL LF (GAUZE/BANDAGES/DRESSINGS) ×2 IMPLANT
BIT DRILL CANN 2.7X625 NONSTRL (BIT) ×2 IMPLANT
BLADE 15 SAFETY STRL DISP (BLADE) ×4 IMPLANT
BLADE MIC 41X13 (BLADE) ×2 IMPLANT
BLADE OSCILLATING/SAGITTAL (BLADE) ×2
BLADE SURG 15 STRL LF DISP TIS (BLADE) ×2 IMPLANT
BLADE SURG 15 STRL SS (BLADE) ×4
BLADE SW THK.38XMED LNG THN (BLADE) ×1 IMPLANT
BNDG CMPR 9X4 STRL LF SNTH (GAUZE/BANDAGES/DRESSINGS) ×1
BNDG COHESIVE 3X5 TAN STRL LF (GAUZE/BANDAGES/DRESSINGS) ×2 IMPLANT
BNDG ESMARK 4X9 LF (GAUZE/BANDAGES/DRESSINGS) ×2 IMPLANT
BNDG GAUZE ELAST 4 BULKY (GAUZE/BANDAGES/DRESSINGS) ×2 IMPLANT
COTTON STERILE ROLL (GAUZE/BANDAGES/DRESSINGS) ×2 IMPLANT
COVER SURGICAL LIGHT HANDLE (MISCELLANEOUS) ×2 IMPLANT
COVER WAND RF STERILE (DRAPES) ×2 IMPLANT
CUFF TOURN SGL QUICK 24 (TOURNIQUET CUFF) ×2
CUFF TRNQT CYL 24X4X40X1 (TOURNIQUET CUFF) ×1 IMPLANT
DRAPE SHEET LG 3/4 BI-LAMINATE (DRAPES) ×2 IMPLANT
DURAPREP 26ML APPLICATOR (WOUND CARE) ×2 IMPLANT
ELECT REM PT RETURN 15FT ADLT (MISCELLANEOUS) ×2 IMPLANT
GAUZE 4X4 16PLY RFD (DISPOSABLE) ×2 IMPLANT
GAUZE SPONGE 4X4 12PLY STRL (GAUZE/BANDAGES/DRESSINGS) ×2 IMPLANT
GAUZE XEROFORM 1X8 LF (GAUZE/BANDAGES/DRESSINGS) ×2 IMPLANT
GLOVE BIO SURGEON STRL SZ8.5 (GLOVE) ×2 IMPLANT
GLOVE BIOGEL PI IND STRL 8 (GLOVE) ×1 IMPLANT
GLOVE BIOGEL PI IND STRL 9 (GLOVE) ×1 IMPLANT
GLOVE BIOGEL PI INDICATOR 8 (GLOVE) ×1
GLOVE BIOGEL PI INDICATOR 9 (GLOVE) ×1
GLOVE ECLIPSE 8.0 STRL XLNG CF (GLOVE) ×2 IMPLANT
GLOVE SURG SS PI 8.0 STRL IVOR (GLOVE) ×2 IMPLANT
GOWN SPEC L3 XXLG W/TWL (GOWN DISPOSABLE) ×2 IMPLANT
GOWN STRL REUS W/TWL XL LVL3 (GOWN DISPOSABLE) ×2 IMPLANT
K-WIRE 1.25 PACK OF 10 (Wire) ×10 IMPLANT
KIT BASIN OR (CUSTOM PROCEDURE TRAY) ×2 IMPLANT
KWIRE 1.25 PACK OF 10 (Wire) ×5 IMPLANT
MANIFOLD NEPTUNE II (INSTRUMENTS) ×2 IMPLANT
PACK ORTHO EXTREMITY (CUSTOM PROCEDURE TRAY) ×2 IMPLANT
PAD CAST 4YDX4 CTTN HI CHSV (CAST SUPPLIES) ×1 IMPLANT
PADDING CAST COTTON 4X4 STRL (CAST SUPPLIES) ×2
PIN CAPS ORTHO GREEN .062 (PIN) ×2 IMPLANT
PROTECTOR NERVE ULNAR (MISCELLANEOUS) ×2 IMPLANT
SCREW CANN S THRD/44 4.0 (Screw) ×2 IMPLANT
SUCTION FRAZIER HANDLE 10FR (MISCELLANEOUS) ×1
SUCTION TUBE FRAZIER 10FR DISP (MISCELLANEOUS) ×1 IMPLANT
SUT ETHILON 3 0 PS 1 (SUTURE) ×4 IMPLANT
SUT PROLENE 4 0 PS 2 18 (SUTURE) ×6 IMPLANT
SUT VIC AB 4-0 RB1 27 (SUTURE) ×2
SUT VIC AB 4-0 RB1 27XBRD (SUTURE) ×1 IMPLANT
TOWEL OR 17X26 10 PK STRL BLUE (TOWEL DISPOSABLE) ×2 IMPLANT

## 2018-10-02 NOTE — Transfer of Care (Signed)
Immediate Anesthesia Transfer of Care Note  Patient: [name]  Procedure(s) Performed: HAMMER TOE CORRECTION 2ND 3RD 4RD FIFTH TOE (Left Foot) HALLUX ARTHRODESIS (Left Foot)  Patient Location: PACU  Anesthesia Type:General  Level of Consciousness: awake, alert , oriented and patient cooperative  Airway & Oxygen Therapy: Patient Spontanous Breathing and Patient connected to face mask oxygen  Post-op Assessment: Report given to RN and Post -op Vital signs reviewed and stable  Post vital signs: Reviewed and stable  Last Vitals:  Vitals Value Taken Time  BP 163/80 10/02/2018  7:09 PM  Temp    Pulse 81 10/02/2018  7:11 PM  Resp 14 10/02/2018  7:11 PM  SpO2 100 % 10/02/2018  7:11 PM  Vitals shown include unvalidated device data.  Last Pain:  Vitals:   10/02/18 1552  TempSrc:   PainSc: 0-No pain         Complications: No apparent anesthesia complications

## 2018-10-02 NOTE — Telephone Encounter (Signed)
Daleen Snook from [facility] called asking for discharge papers on Dr.  Amalia Hailey patient [name].  I contacted Dr.  Amalia Hailey and he was given the phone number for [facility].  He was to call immediately.  The phone number is [phone].   Gardiner Barefoot DPM

## 2018-10-02 NOTE — Interval H&P Note (Signed)
History and Physical Interval Note:  10/02/2018 4:53 PM  [name]  has presented today for surgery, with the diagnosis of HAMMERTOE,MALLET TOE  The various methods of treatment have been discussed with the patient and family. After consideration of risks, benefits and other options for treatment, the patient has consented to  Procedure(s): HAMMER TOE CORRECTION 2ND 3RD 4RD FIFTH TOE (Left) HALLUX INPERPHALANGEAL FUSION (Left) as a surgical intervention .  The patient's history has been reviewed, patient examined, no change in status, stable for surgery.  I have reviewed the patient's chart and labs.  Questions were answered to the patient's satisfaction.     Edrick Kins

## 2018-10-02 NOTE — Anesthesia Preprocedure Evaluation (Addendum)
Anesthesia Evaluation  Patient identified by MRN, date of birth, ID band Patient awake    Reviewed: Allergy & Precautions, H&P , NPO status , Patient's Chart, lab work & pertinent test results, reviewed documented beta blocker date and time   Airway Mallampati: I  TM Distance: >3 FB Neck ROM: Full  Mouth opening: Limited Mouth Opening  Dental no notable dental hx. (+) Edentulous Upper, Edentulous Lower   Pulmonary sleep apnea , COPD,  COPD inhaler,  Sarcoidosis   Pulmonary exam normal breath sounds clear to auscultation       Cardiovascular hypertension, Pt. on medications and Pt. on home beta blockers +CHF  Normal cardiovascular exam Rhythm:Regular Rate:Normal  '18 Cath - Angiographically normal left main, RCA, and LCx. Minimal nonobstructive irregularity in the LAD. Normal LVEDP   Neuro/Psych Anxiety Depression negative neurological ROS     GI/Hepatic Neg liver ROS, GERD  Medicated and Controlled,  Endo/Other  diabetes, Insulin Dependent, Oral Hypoglycemic AgentsMorbid obesity  Renal/GU Renal disease  negative genitourinary   Musculoskeletal  (+) Arthritis , Osteoarthritis,    Abdominal (+) + obese,   Peds  Hematology negative hematology ROS (+) Blood dyscrasia, anemia ,   Anesthesia Other Findings   Reproductive/Obstetrics                            Anesthesia Physical  Anesthesia Plan  ASA: III  Anesthesia Plan: General   Post-op Pain Management:    Induction: Intravenous  PONV Risk Score and Plan: 2 and Treatment may vary due to age or medical condition and Propofol infusion  Airway Management Planned: LMA  Additional Equipment: None  Intra-op Plan:   Post-operative Plan:   Informed Consent: I have reviewed the patients History and Physical, chart, labs and discussed the procedure including the risks, benefits and alternatives for the proposed anesthesia with the patient  or authorized representative who has indicated his/her understanding and acceptance.   Dental advisory given  Plan Discussed with: CRNA, Anesthesiologist and Surgeon  Anesthesia Plan Comments: ( )       Anesthesia Quick Evaluation

## 2018-10-02 NOTE — Anesthesia Procedure Notes (Signed)
Procedure Name: LMA Insertion Date/Time: 10/02/2018 5:12 PM Performed by: West Pugh, CRNA Pre-anesthesia Checklist: Patient identified, Emergency Drugs available, Suction available, Patient being monitored and Timeout performed Patient Re-evaluated:Patient Re-evaluated prior to induction Oxygen Delivery Method: Circle system utilized Preoxygenation: Pre-oxygenation with 100% oxygen Induction Type: IV induction Ventilation: Mask ventilation without difficulty LMA: LMA with gastric port inserted LMA Size: 4.0 Number of attempts: 1 Placement Confirmation: positive ETCO2 Tube secured with: Tape Dental Injury: Teeth and Oropharynx as per pre-operative assessment

## 2018-10-02 NOTE — Brief Op Note (Signed)
10/02/2018  7:18 PM  PATIENT:  [name]  57 y.o. female  PRE-OPERATIVE DIAGNOSIS:  HAMMERTOE, MALLET TOE  POST-OPERATIVE DIAGNOSIS:  HAMMERTOE, MALLET TOE  PROCEDURE:  Procedure(s): HAMMER TOE CORRECTION 2ND 3RD 4RD FIFTH TOE (Left) HALLUX ARTHRODESIS (Left)  SURGEON:  Surgeon(s) and Role:    Edrick Kins, DPM - Primary  PHYSICIAN ASSISTANT:   ASSISTANTS: none   ANESTHESIA:   local  EBL:  Minimal   BLOOD ADMINISTERED:none  DRAINS: none   LOCAL MEDICATIONS USED:  MARCAINE    and LIDOCAINE   SPECIMEN:  No Specimen  DISPOSITION OF SPECIMEN:  N/A  COUNTS:  YES  TOURNIQUET:   Total Tourniquet Time Documented: Calf (Left) - 86 minutes Total: Calf (Left) - 86 minutes   DICTATION: .Viviann Spare Dictation  PLAN OF CARE: Discharge to home after PACU  PATIENT DISPOSITION:  PACU - hemodynamically stable.   Delay start of Pharmacological VTE agent (>24hrs) due to surgical blood loss or risk of bleeding: not applicable  Edrick Kins, DPM [facility]  Dr. Edrick Kins, DPM    [address], [place] [address]                Office [phone]  Fax [phone]

## 2018-10-04 NOTE — Anesthesia Postprocedure Evaluation (Signed)
Anesthesia Post Note  Patient: [name]  Procedure(s) Performed: HAMMER TOE CORRECTION 2ND 3RD 4RD FIFTH TOE (Left Foot) HALLUX ARTHRODESIS (Left Foot)     Patient location during evaluation: PACU Anesthesia Type: General Level of consciousness: awake and alert Pain management: pain level controlled Vital Signs Assessment: post-procedure vital signs reviewed and stable Respiratory status: spontaneous breathing, nonlabored ventilation, respiratory function stable and patient connected to nasal cannula oxygen Cardiovascular status: blood pressure returned to baseline and stable Postop Assessment: no apparent nausea or vomiting Anesthetic complications: no    Last Vitals:  Vitals:   10/02/18 2015 10/02/18 2042  BP: (!) 171/70 (!) 154/64  Pulse: 84 83  Resp: 17 15  Temp:  36.8 C  SpO2: 95% 93%    Last Pain:  Vitals:   10/02/18 2042  TempSrc:   PainSc: 0-No pain                 Jobe Mutch

## 2018-10-07 NOTE — Op Note (Signed)
OPERATIVE REPORT Patient name: [name] MRN: [id number] DOB: September 18, 1961  DOS: 10/02/2018  Preop Dx: Hammertoe digits 2-5 left.  Hallux malleus left. Postop Dx: same  Procedure:  1.  PIPJ arthroplasty digits 2, 3, 4 left 2.  PIPJ arthroplasty with derotational skin plasty 5 left 3.  Hallux IPJ arthrodesis left  Surgeon: Edrick Kins DPM  Anesthesia: 50-50 mixture of 2% lidocaine plain with 0.5% Marcaine plain totaling 20 cc infiltrated in the patient's left lower extremity  Hemostasis: Ankle tourniquet inflated to a pressure of 247mmHg after esmarch exsanguination   EBL: Minimal mL Materials: Synthes 4.0 mm cannulated screw x1 Injectables: None Pathology: None  Condition: The patient tolerated the procedure and anesthesia well. No complications noted or reported   Justification for procedure: The patient is a 57 y.o. female who presents today for surgical correction of hallux malleus and hammertoes 2-5 left foot. All conservative modalities of been unsuccessful in providing any sort of satisfactory alleviation of symptoms with the patient. The patient was told benefits as well as possible side effects of the surgery. The patient consented for surgical correction. The patient consent form was reviewed. All patient questions were answered. No guarantees were expressed or implied. The patient and the surgeon boson the patient consent form with the witness present and placed in the patient's chart.   Procedure in Detail: The patient was brought to the operating room, placed in the operating table in the supine position at which time an aseptic scrub and drape were performed about the patient's respective lower extremity after anesthesia was induced as described above. Attention was then directed to the surgical area where procedure number one commenced.  Procedure #1: PIPJ arthroplasty second digit left A 4 cm linear longitudinal skin incision was planned and made overlying  the PIPJ and MTPJ of the second digit left foot.  Incision was carried down to the level of EDL tendon with care taken to cut clamp ligate and retract away all small neurovascular structures traversing the incision site.  Z-plasty tenotomy was performed of the EDL tendon to expose the underlying PIPJ and MTPJ.  At this time the hypertrophic head of the proximal phalanx was sharply dissected and an osteotomy was created at the surgical phalangeal neck of the proximal phalanx.  Head was removed in toto.  Kelikian maneuver indicated more proximal pathology existed contributory to the hammertoe deformity.  Procedure #2: MTPJ capsulotomy with percutaneous fixation second digit left Within the incision site previously mentioned procedure 1 incision was slightly extended proximal for full exposure of the MTPJ.  Dorsal capsulotomy performed releasing the contracture of the MTPJ.  A 1.25 mm percutaneous K wire was utilized to hold the toe in a rectus alignment during postoperative healing for temporary fixation.  Intraoperative x-ray fluoroscopy was utilized to visualize the placement of the percutaneous K wire which was satisfactory.  The percutaneous portion was bent dorsal at a 90 degree angle and cut with a synthetic ball Placed over the percutaneous portion.  The opposing ends of the Z-plasty tenotomy performed to the EDL tendon were reapproximated under normal physiologic tension.  Irrigation was performed and routine layered primary soft tissue closure was achieved.  Attention was then directed to the third digit left foot.  Procedure #3: PIPJ arthroplasty third digit left foot with percutaneous K wire fixation A transverse incision in an elliptical fashion approximately 1 cm in length was planned and made overlying the PIPJ of the third digit left foot.  Skin was  ellipsed to expose the underlying EDL tendon.  Transverse tenotomy was performed to expose the underlying PIPJ.  Sharp soft tissue dissection was  utilized to expose the hypertrophic head of the proximal phalanx.  Osteotomy performed at the surgical phalangeal neck of the proximal phalanx.  The head was removed in toto.  Irrigation was performed and a 1.25 mm percutaneous K wire was utilized for temporary percutaneous fixation driven through the DIPJ, PIPJ, and MTPJ of the respective ray.  The percutaneous portion was bent dorsal at a 9 degree angle and cut with a synthetic ball Placed over the percutaneous portion.  4-0 Vicryl suture utilized to reapproximate the opposing ends of the transverse tenotomy followed by 4-0 Prolene suture to reapproximate superficial skin edges.  Intraoperative x-ray fluoroscopy was utilized to guide the placement of the percutaneous K wire.  Procedure #4: PIPJ arthroplasty fourth digit left foot with percutaneous K wire fixation Procedure #4 commenced in the exact same fashion manner as procedure #3 with the exception of procedure #4 was performed to the fourth digit left foot.  Procedure #5: PIPJ arthroplasty with derotational skin plasty with digit left foot An oblique elliptical type incision was planned and made overlying the fifth digit left foot PIPJ.  Skin was ellipsed to expose the underlying EDL tendon in which a transverse tenotomy was performed.  Hypertrophic head of the proximal phalanx was sharp fully dissected from surrounding soft tissue and an osteotomy was performed at the surgical phalangeal neck of the proximal phalanx.  Irrigation was utilized and the toe was derotated into a good rectus position for routine layered soft tissue closure.  4-0 Vicryl suture utilized to reapproximate the opposing ends of the transverse tenotomy followed by 4-0 Prolene suture to reapproximate superficial skin edges.  Procedure #6: Hallux IPJ arthrodesis left A skin incision was planned and made over the IPJ of the left hallux.  Transverse tenotomy was performed of the EHL tendon to allow exposure of the underlying IPJ.   Cartilaginous areas of the IPJ were sharply dissected away to allow for good arthrodesis of the joint.  Hypertrophic bone spurring around the joint was also sharply dissected away down to a smooth anatomical contour.  At this time a 4.0 mm cannulated Synthes screw was driven percutaneously beginning at the distal tip of the toe through the IPJ.  Screw ended just distal to the MTPJ ensuring not to interrupt the joint.  Intraoperative x-ray fluoroscopy was utilized to visualize the placement of the screw and the arthrodesis site which was satisfactory.  Screw was inserted in the standard AO fashion.  The percutaneous incision which was made for screw placement was primarily closed with 4-0 Prolene suture and the transverse incision over the interphalangeal joint was primarily closed using a combination of 4-0 Vicryl suture with reapproximation of the EHL tendon and 4-0 Prolene suture.  Prior to routine layered soft tissue closure copious irrigation was utilized.  Dry sterile compressive dressings were then applied to all previously mentioned incision sites about the patient's lower extremity. The tourniquet which was used for hemostasis was deflated. All normal neurovascular responses including pink color and warmth returned all the digits of patient's lower extremity.  The patient was then transferred from the operating room to the recovery room having tolerated the procedure and anesthesia well. All vital signs are stable. After a brief stay in the recovery room the patient was discharged with adequate prescriptions for analgesia. Verbal as well as written instructions were provided for the patient regarding  wound care. The patient is to keep the dressings clean dry and intact until they are to follow surgeon Dr. Daylene Katayama in the office upon discharge.   Edrick Kins, DPM [facility]  Dr. Edrick Kins, [address]                                        [place], [place]  [address]                Office [phone]  Fax [phone]

## 2018-10-09 ENCOUNTER — Ambulatory Visit (INDEPENDENT_AMBULATORY_CARE_PROVIDER_SITE_OTHER): Payer: BLUE CROSS/BLUE SHIELD

## 2018-10-09 ENCOUNTER — Ambulatory Visit (INDEPENDENT_AMBULATORY_CARE_PROVIDER_SITE_OTHER): Payer: BLUE CROSS/BLUE SHIELD | Admitting: Podiatry

## 2018-10-09 ENCOUNTER — Telehealth: Payer: Self-pay | Admitting: Podiatry

## 2018-10-09 VITALS — Temp 98.9°F

## 2018-10-09 DIAGNOSIS — M2042 Other hammer toe(s) (acquired), left foot: Secondary | ICD-10-CM

## 2018-10-09 DIAGNOSIS — M2041 Other hammer toe(s) (acquired), right foot: Secondary | ICD-10-CM

## 2018-10-09 DIAGNOSIS — Z9889 Other specified postprocedural states: Secondary | ICD-10-CM

## 2018-10-09 NOTE — Telephone Encounter (Signed)
Pt was seen this morning and forgot to ask if she is able to take a shower at this point. Had surgery on 12/23. Please give pt a call.

## 2018-10-09 NOTE — Telephone Encounter (Signed)
I called pt and informed she could shower if she could keep the dressing clean and dry.

## 2018-10-11 NOTE — Progress Notes (Signed)
   Subjective:  Patient presents today status post left forefoot reconstruction surgery. DOS: 10/02/18. She states she is doing well with only minimal pain. She denies any modifying factors. She has been using the CAM boot as directed. Patient is here for further evaluation and treatment.    Past Medical History:  Diagnosis Date  . Abnormal SPEP 04/17/2014  . Acute left ankle pain 01/26/2017  . ANEMIA-UNSPECIFIED 09/18/2009  . Anxiety   . Arthritis   . CHF (congestive heart failure) ([place])   . Chronic diastolic heart failure, NYHA class 2 (HCC)    Normal LVEDP by May 2018  . COPD (chronic obstructive pulmonary disease) ([place])   . Depression   . DIABETES MELLITUS, TYPE II 08/21/2006  . Diabetic osteomyelitis ([place]) 05/29/2015  . Fracture of 5th metatarsal    non union  . GERD 85/88/5027   Had fundoplication  . GOUT 08/20/2010  . Hx of umbilical hernia repair   . HYPERLIPIDEMIA 08/21/2006  . HYPERTENSION 08/21/2006  . Infection of wound due to methicillin resistant Staphylococcus aureus (MRSA)   . Internal hemorrhoids   . Multiple allergies 10/14/2016  . OBESITY 06/04/2009  . Onychomycosis 10/27/2015  . Osteomyelitis of left foot ([place]) 05/29/2015  . Pulmonary sarcoidosis ([place])    Followed locally by pulmonology, but also by Dr. Casper Harrison at [facility]  . Right knee pain 01/26/2017  . Vocal cord dysfunction   . Wears partial dentures       Objective/Physical Exam Neurovascular status intact.  Skin incisions appear to be well coapted with sutures and staples intact. No sign of infectious process noted. No dehiscence. No active bleeding noted. Moderate edema noted to the surgical extremity. Hammertoe contracture deformity noted to digits 2-5 of the right foot.  Radiographic Exam:  Orthopedic hardware and osteotomies sites appear to be stable with routine healing.  Assessment: 1. s/p left forefoot reconstruction surgery. DOS: 10/02/18 2. Hammertoes digits 2-5 right   Plan  of Care:  1. Patient was evaluated. X-rays reviewed 2. Dressing changed. Keep clean, dry and intact for one week.  3. Continue weightbearing in CAM boot.  4. Today we discussed the conservative versus surgical management of the presenting pathology. The patient opts for surgical management. All possible complications and details of the procedure were explained. All patient questions were answered. No guarantees were expressed or implied. 5. Authorization for surgery was initiated today. Surgery will consist of PIPJ arthroplasty with possible internal screw fixation digits 2, 3 and 4 right; PIPJ arthroplasty digit 5 right.  6. Return to clinic in one week for suture removal.    Edrick Kins, DPM [facility]  Dr. Edrick Kins, [address]                                        [place], [place] [address]                Office [phone]  Fax [phone]

## 2018-10-12 ENCOUNTER — Other Ambulatory Visit: Payer: BLUE CROSS/BLUE SHIELD

## 2018-10-13 ENCOUNTER — Encounter (HOSPITAL_COMMUNITY): Payer: Self-pay | Admitting: Podiatry

## 2018-10-18 ENCOUNTER — Ambulatory Visit (INDEPENDENT_AMBULATORY_CARE_PROVIDER_SITE_OTHER): Payer: Self-pay | Admitting: Podiatry

## 2018-10-18 DIAGNOSIS — Z9889 Other specified postprocedural states: Secondary | ICD-10-CM

## 2018-10-18 DIAGNOSIS — M2042 Other hammer toe(s) (acquired), left foot: Secondary | ICD-10-CM

## 2018-10-18 DIAGNOSIS — M205X2 Other deformities of toe(s) (acquired), left foot: Secondary | ICD-10-CM

## 2018-10-19 ENCOUNTER — Other Ambulatory Visit: Payer: Self-pay | Admitting: Endocrinology

## 2018-10-19 NOTE — Telephone Encounter (Signed)
Please advise if refill is appropriate 

## 2018-10-19 NOTE — Telephone Encounter (Signed)
Please refer request to PCP 

## 2018-10-22 NOTE — Progress Notes (Signed)
   Subjective:  Patient presents today status post left forefoot reconstruction surgery. DOS: 10/02/18. She states she is doing well. She reports the green caps keep falling off the the pins. She denies any significant pain or modifying factors. She has been using the CAM boot as directed. Patient is here for further evaluation and treatment.    Past Medical History:  Diagnosis Date  . Abnormal SPEP 04/17/2014  . Acute left ankle pain 01/26/2017  . ANEMIA-UNSPECIFIED 09/18/2009  . Anxiety   . Arthritis   . CHF (congestive heart failure) ([place])   . Chronic diastolic heart failure, NYHA class 2 (HCC)    Normal LVEDP by May 2018  . COPD (chronic obstructive pulmonary disease) ([place])   . Depression   . DIABETES MELLITUS, TYPE II 08/21/2006  . Diabetic osteomyelitis ([place]) 05/29/2015  . Fracture of 5th metatarsal    non union  . GERD 08/65/7846   Had fundoplication  . GOUT 08/20/2010  . Hx of umbilical hernia repair   . HYPERLIPIDEMIA 08/21/2006  . HYPERTENSION 08/21/2006  . Infection of wound due to methicillin resistant Staphylococcus aureus (MRSA)   . Internal hemorrhoids   . Multiple allergies 10/14/2016  . OBESITY 06/04/2009  . Onychomycosis 10/27/2015  . Osteomyelitis of left foot ([place]) 05/29/2015  . Pulmonary sarcoidosis ([place])    Followed locally by pulmonology, but also by Dr. Casper Harrison at [facility]  . Right knee pain 01/26/2017  . Vocal cord dysfunction   . Wears partial dentures       Objective/Physical Exam Neurovascular status intact.  Skin incisions appear to be well coapted with sutures and staples intact. No sign of infectious process noted. No dehiscence. No active bleeding noted. Moderate edema noted to the surgical extremity. Hammertoe contracture deformity noted to digits 2-5 of the right foot.  Assessment: 1. s/p left forefoot reconstruction surgery. DOS: 10/02/18 2. Hammertoes digits 2-5 right   Plan of Care:  1. Patient was evaluated.  2. Sutures  removed. Dry sterile dressing applied. Keep clean, dry and intact for two weeks.  3. Continue weightbearing in CAM boot.  4. Return to clinic in 2 weeks for percutaneous pin removal.     Edrick Kins, DPM [facility]  Dr. Edrick Kins, [address]                                        [place],  [address]                Office [phone]  Fax [phone]

## 2018-10-25 ENCOUNTER — Ambulatory Visit: Payer: BLUE CROSS/BLUE SHIELD | Admitting: Endocrinology

## 2018-10-25 ENCOUNTER — Other Ambulatory Visit: Payer: Self-pay | Admitting: Obstetrics and Gynecology

## 2018-10-25 DIAGNOSIS — Z1231 Encounter for screening mammogram for malignant neoplasm of breast: Secondary | ICD-10-CM

## 2018-11-01 ENCOUNTER — Ambulatory Visit (INDEPENDENT_AMBULATORY_CARE_PROVIDER_SITE_OTHER): Payer: 59

## 2018-11-01 ENCOUNTER — Encounter: Payer: Self-pay | Admitting: Podiatry

## 2018-11-01 ENCOUNTER — Telehealth: Payer: Self-pay | Admitting: Podiatry

## 2018-11-01 ENCOUNTER — Ambulatory Visit (INDEPENDENT_AMBULATORY_CARE_PROVIDER_SITE_OTHER): Payer: Medicare Other | Admitting: Podiatry

## 2018-11-01 DIAGNOSIS — M2042 Other hammer toe(s) (acquired), left foot: Secondary | ICD-10-CM | POA: Diagnosis not present

## 2018-11-01 DIAGNOSIS — Z9889 Other specified postprocedural states: Secondary | ICD-10-CM

## 2018-11-01 NOTE — Telephone Encounter (Signed)
Dr. Amalia Hailey sent a compression ankle sock home with my wife this morning. I was wondering if I could come in and get an extra large for her. Please call me back on my cell.

## 2018-11-01 NOTE — Telephone Encounter (Signed)
Left message informing pt's husband, [name] that pt may have received the largest compression sock, but we would like be happy to check for a particular size.

## 2018-11-06 ENCOUNTER — Ambulatory Visit (INDEPENDENT_AMBULATORY_CARE_PROVIDER_SITE_OTHER): Payer: 59 | Admitting: Orthopedic Surgery

## 2018-11-06 ENCOUNTER — Ambulatory Visit (INDEPENDENT_AMBULATORY_CARE_PROVIDER_SITE_OTHER): Payer: Self-pay

## 2018-11-06 ENCOUNTER — Telehealth: Payer: Self-pay | Admitting: *Deleted

## 2018-11-06 ENCOUNTER — Encounter (INDEPENDENT_AMBULATORY_CARE_PROVIDER_SITE_OTHER): Payer: Self-pay | Admitting: Orthopedic Surgery

## 2018-11-06 VITALS — Ht 66.0 in | Wt 251.0 lb

## 2018-11-06 DIAGNOSIS — G8929 Other chronic pain: Secondary | ICD-10-CM | POA: Diagnosis not present

## 2018-11-06 DIAGNOSIS — M25512 Pain in left shoulder: Secondary | ICD-10-CM

## 2018-11-06 MED ORDER — LIDOCAINE HCL 1 % IJ SOLN
5.0000 mL | INTRAMUSCULAR | Status: AC | PRN
  Administered 2018-11-06: 5 mL

## 2018-11-06 MED ORDER — METHYLPREDNISOLONE ACETATE 40 MG/ML IJ SUSP
40.0000 mg | INTRAMUSCULAR | Status: AC | PRN
  Administered 2018-11-06: 40 mg via INTRA_ARTICULAR

## 2018-11-06 NOTE — Telephone Encounter (Signed)
"  Did you all get medical clearance for her surgery?"

## 2018-11-06 NOTE — Progress Notes (Signed)
Office Visit Note   Patient: [name]           Date of Birth: [birth year]           MRN: [id number] Visit Date: 11/06/2018              Requested by: [name], MD [place], [place] [address] PCP: [name], MD  Chief Complaint  Patient presents with  . Left Shoulder - Pain      HPI: Patient is a 58 year old woman who presents complaining of left shoulder pain over the deltoid which radiates down the biceps.  She denies any neck pain she states she has difficulty sleeping and cannot raise her hand above her head or behind her back.  Assessment & Plan: Visit Diagnoses:  1. Chronic left shoulder pain     Plan: Patient underwent a left subacromial injection reevaluate in 4 weeks.  Follow-Up Instructions: Return in about 4 weeks (around 12/04/2018).   Ortho Exam  Patient is alert, oriented, no adenopathy, well-dressed, normal affect, normal respiratory effort. Examination patient has active abduction and flexion to 70 degrees she has 45 degrees of internal and external rotation with pain with Neer and Hawkins impingement test pain with drop arm test the biceps tendon is tender to palpation.  Imaging: Xr Shoulder Left  Result Date: 11/06/2018 2 view radiographs of the left shoulder shows a congruent glenohumeral joint mild arthritic changes at the [facility] joint no fractures.  No images are attached to the encounter.  Labs: Lab Results  Component Value Date   HGBA1C 8.2 (A) 08/25/2018   HGBA1C 7.6 (A) 04/11/2018   HGBA1C 8.4 (H) 11/29/2017   ESRSEDRATE 28 06/16/2017   ESRSEDRATE 5 01/26/2017   ESRSEDRATE 27 10/14/2016   CRP 27.1 (H) 06/16/2017   CRP 17.3 (H) 01/26/2017   CRP 24.0 (H) 10/14/2016   LABURIC 5.1 01/26/2017   REPTSTATUS 09/22/2017 FINAL 09/20/2017   GRAMSTAIN  08/14/2017    MODERATE WBC PRESENT, PREDOMINANTLY PMN MODERATE BUDDING YEAST SEEN    CULT NO GROUP A STREP (S.PYOGENES) ISOLATED 09/20/2017   LABORGA  PSEUDOMONAS FLUORESCENS 08/14/2017     Lab Results  Component Value Date   ALBUMIN 4.4 04/05/2018   ALBUMIN 4.2 02/08/2018   ALBUMIN 4.0 12/22/2017   LABURIC 5.1 01/26/2017    Body mass index is 40.51 kg/m.  Orders:  Orders Placed This Encounter  Procedures  . XR Shoulder Left   No orders of the defined types were placed in this encounter.    Procedures: Large Joint Inj: L subacromial bursa on 11/06/2018 5:07 PM Indications: diagnostic evaluation and pain Details: 22 G 1.5 in needle, posterior approach  Arthrogram: No  Medications: 5 mL lidocaine 1 %; 40 mg methylPREDNISolone acetate 40 MG/ML Outcome: tolerated well, no immediate complications Procedure, treatment alternatives, risks and benefits explained, specific risks discussed. Consent was given by the patient. Immediately prior to procedure a time out was called to verify the correct patient, procedure, equipment, support staff and site/side marked as required. Patient was prepped and draped in the usual sterile fashion.      Clinical Data: No additional findings.  ROS:  All other systems negative, except as noted in the HPI. Review of Systems  Objective: Vital Signs: Ht 5\' 6"  (1.676 m)   Wt 251 lb (113.9 kg)   BMI 40.51 kg/m   Specialty Comments:  No specialty comments available.  PMFS History: Patient Active Problem List   Diagnosis Date  Noted  . Subcortical microvascular ischemic occlusive disease 07/12/2018  . Rapid palpitations 07/12/2018  . Impingement of left ankle joint 06/26/2018  . Constipation 04/21/2018  . Anxiety 04/05/2018  . Total knee replacement status, left 12/07/2017  . Dysphagia 09/20/2017  . Epiglottitis   . Plantar fasciitis, right 07/13/2017  . S/P total knee arthroplasty, right 06/29/2017  . Osteoarthrosis, localized, primary, knee, right   . Sprain of calcaneofibular ligament of right ankle 06/06/2017  . Osteoarthritis of right knee 01/26/2017  . Onychomycosis  10/27/2015  . CKD (chronic kidney disease) stage 3, GFR 30-59 ml/min (HCC) 04/27/2015  . MRSA (methicillin resistant staph aureus) culture positive 03/27/2015  . History of osteomyelitis 03/27/2015  . Fatty liver 09/30/2014  . Hot flashes 07/15/2014  . Abnormal SPEP 04/17/2014  . Fracture of left leg 04/17/2014  . Cushingoid side effect of steroids ([place]) 04/17/2014  . Internal hemorrhoids   . Depression   . Preoperative clearance 03/25/2014  . Obstructive chronic bronchitis without exacerbation ([place]) 09/18/2013  . Chest pain 04/11/2013  . Hypertensive heart disease with chronic diastolic congestive heart failure ([place])   . Solitary pulmonary nodule, on CT 02/2013 - stable over 2 years in 2015 02/20/2013  . Gout 08/20/2010  . Anemia 09/18/2009  . Morbid obesity ([place]) 06/04/2009  . Sleep apnea 04/21/2009  . Sarcoidosis of lung ([place]) 04/10/2007  . Hyperlipidemia 08/21/2006  . Essential hypertension 08/21/2006  . GERD 08/21/2006  . Type II diabetes mellitus with renal manifestations ([place]) 08/21/2006   Past Medical History:  Diagnosis Date  . Abnormal SPEP 04/17/2014  . Acute left ankle pain 01/26/2017  . ANEMIA-UNSPECIFIED 09/18/2009  . Anxiety   . Arthritis   . CHF (congestive heart failure) ([place])   . Chronic diastolic heart failure, NYHA class 2 (HCC)    Normal LVEDP by May 2018  . COPD (chronic obstructive pulmonary disease) ([name])   . Depression   . DIABETES MELLITUS, TYPE II 08/21/2006  . Diabetic osteomyelitis ([place]) 05/29/2015  . Fracture of 5th metatarsal    non union  . GERD 35/32/9924   Had fundoplication  . GOUT 08/20/2010  . Hx of umbilical hernia repair   . HYPERLIPIDEMIA 08/21/2006  . HYPERTENSION 08/21/2006  . Infection of wound due to methicillin resistant Staphylococcus aureus (MRSA)   . Internal hemorrhoids   . Multiple allergies 10/14/2016  . OBESITY 06/04/2009  . Onychomycosis 10/27/2015  . Osteomyelitis of left foot ([place]) 05/29/2015  . Pulmonary sarcoidosis  ([place])    Followed locally by pulmonology, but also by Dr. Casper Harrison at [facility]  . Right knee pain 01/26/2017  . Vocal cord dysfunction   . Wears partial dentures     Family History  Problem Relation Age of Onset  . Diabetes Father   . Heart attack Father 81  . Coronary artery disease Father   . Heart failure Father   . Throat cancer Father   . COPD Mother   . Emphysema Mother   . Asthma Mother   . Heart failure Mother   . Breast cancer Mother   . Heart attack Maternal Grandfather   . Sarcoidosis Maternal Uncle   . Lung cancer Brother   . Diabetes Brother   . Colon cancer Neg Hx   . Rectal cancer Neg Hx     Past Surgical History:  Procedure Laterality Date  . ABDOMINAL HYSTERECTOMY    . APPENDECTOMY    . BLADDER SUSPENSION  11/11/2011   Procedure: TRANSVAGINAL TAPE (TVT) PROCEDURE;  Surgeon: Olga Millers, MD;  Location: [facility];  Service: Gynecology;  Laterality: N/A;  . CAROTIDS  02/18/11   CAROTID DUPLEX; VERTEBRALS ARE PATENT WITH ANTEGRADE FLOW. ICA/CCA RATIO 1.61 ON RIGHT AND 0.75 ON LEFT  . CHOLECYSTECTOMY  1984  . CYSTOSCOPY  11/11/2011   Procedure: CYSTOSCOPY;  Surgeon: Olga Millers, MD;  Location: [facility];  Service: Gynecology;  Laterality: N/A;  . EXTUBATION (ENDOTRACHEAL) IN OR N/A 09/23/2017   Procedure: EXTUBATION (ENDOTRACHEAL) IN OR;  Surgeon: Helayne Seminole, MD;  Location: [facility];  Service: ENT;  Laterality: N/A;  . FIBEROPTIC LARYNGOSCOPY AND TRACHEOSCOPY N/A 09/23/2017   Procedure: FLEXIBLE FIBEROPTIC LARYNGOSCOPY;  Surgeon: Helayne Seminole, MD;  Location: [facility];  Service: ENT;  Laterality: N/A;  . FRACTURE SURGERY     foot  . HALLUX FUSION Left 10/02/2018   Procedure: HALLUX ARTHRODESIS;  Surgeon: Edrick Kins, DPM;  Location: [facility];  Service: Podiatry;  Laterality: Left;  . HAMMER TOE SURGERY Left 10/02/2018   Procedure: HAMMER TOE CORRECTION 2ND 3RD 4RD FIFTH TOE;  Surgeon: Edrick Kins, DPM;  Location: [facility];  Service:  Podiatry;  Laterality: Left;  . HERNIA REPAIR    . I&D EXTREMITY Left 06/27/2015   Procedure: Partial Excision Left Calcaneus, Place Antibiotic Beads, and Wound VAC;  Surgeon: Newt Minion, MD;  Location: [facility];  Service: Orthopedics;  Laterality: Left;  . KNEE ARTHROSCOPY     right  . LEFT AND RIGHT HEART CATHETERIZATION WITH CORONARY ANGIOGRAM N/A 04/23/2013   Procedure: LEFT AND RIGHT HEART CATHETERIZATION WITH CORONARY ANGIOGRAM;  Surgeon: Leonie Man, MD;  Location: [facility] C[facility];  Service: Cardiovascular;  Laterality: N/A;  . LEFT HEART CATH AND CORONARY ANGIOGRAPHY N/A 03/11/2017   Procedure: Left Heart Cath and Coronary Angiography;  Surgeon: Sherren Mocha, MD;  Location: [facility]; angiographically minimal CAD in the LAD otherwise normal.;  Normal LVEDP.  FALSE POSITIVE MYOVIEW  . LEFT HEART CATH AND CORONARY ANGIOGRAPHY  07/20/2010   LVEF 50-55% WITH VERY MILD GLOBAL HYPOKINESIA; ESSENTIALLY NORMAL CORONARY ARTERIES; NORMAL LV FUNCTION  . METATARSAL OSTEOTOMY WITH OPEN REDUCTION INTERNAL FIXATION (ORIF) METATARSAL WITH FUSION Left 04/09/2014   Procedure: LEFT FOOT FRACTURE OPEN TREATMENT METATARSAL INCLUDES INTERNAL FIXATION EACH;  Surgeon: Lorn Junes, MD;  Location: [facility];  Service: Orthopedics;  Laterality: Left;  . NISSEN FUNDOPLICATION  1287  . NM MYOVIEW LTD  03/09/2013   Lexiscan --> EF 50%; NORMAL MYOCARDIAL PERFUSION STUDY - breast attenuation  . NM MYOVIEW LTD  03/10/2017   : Moderate size "stress-induced "perfusion defect at the apex as well as "ill-defined stress-induced perfusion defect in the lateral wall.  EF 55%.  INTERMEDIATE risk. -->  FALSE POSITIVE  . Right and left CARDIAC CATHETERIZATION  04/23/2013   Angiographic normal coronaries; LVEDP 20 mmHg, PCWP 12-14 mmHg, RAP 12 mmHg.; Fick CO/CI 4.9/2.2  . TOTAL KNEE ARTHROPLASTY Right 06/29/2017   Procedure: RIGHT TOTAL KNEE ARTHROPLASTY;  Surgeon: Newt Minion, MD;  Location: [facility];   Service: Orthopedics;  Laterality: Right;  . TOTAL KNEE ARTHROPLASTY Left 12/07/2017  . TOTAL KNEE ARTHROPLASTY Left 12/07/2017   Procedure: LEFT TOTAL KNEE ARTHROPLASTY;  Surgeon: Newt Minion, MD;  Location: [facility] [place] [facility];  Service: Orthopedics;  Laterality: Left;  . TRACHEOSTOMY TUBE PLACEMENT N/A 09/20/2017   Procedure: AWAKE INTUBATION WITH ANESTHESIA WITH VIDEO ASSISTANCE;  Surgeon: Helayne Seminole, MD;  Location: [facility];  Service: ENT;  Laterality: N/A;  .  TRANSTHORACIC ECHOCARDIOGRAM  08/2014   Normal LV size and function.  Mild LVH.  EF 55-60%.  Normal regional wall motion.  GR 1 DD.  Normal RV size and function .  [name] TUBAL LIGATION     with reversal in 1994  . VENTRAL HERNIA REPAIR     Social History   Occupational History  . Occupation: DISABLED  Tobacco Use  . Smoking status: Never Smoker  . Smokeless tobacco: Never Used  Substance and Sexual Activity  . Alcohol use: No    Alcohol/week: 0.0 standard drinks  . Drug use: No  . Sexual activity: Yes    Birth control/protection: Surgical

## 2018-11-07 NOTE — Telephone Encounter (Signed)
I sent [name] the medical clearance via [employer].

## 2018-11-09 ENCOUNTER — Telehealth: Payer: Self-pay | Admitting: Podiatry

## 2018-11-09 ENCOUNTER — Ambulatory Visit (INDEPENDENT_AMBULATORY_CARE_PROVIDER_SITE_OTHER): Payer: 59

## 2018-11-09 ENCOUNTER — Ambulatory Visit (INDEPENDENT_AMBULATORY_CARE_PROVIDER_SITE_OTHER): Payer: Self-pay | Admitting: Podiatry

## 2018-11-09 DIAGNOSIS — M2042 Other hammer toe(s) (acquired), left foot: Secondary | ICD-10-CM

## 2018-11-09 DIAGNOSIS — Z9889 Other specified postprocedural states: Secondary | ICD-10-CM

## 2018-11-09 IMAGING — DX DG ABDOMEN 1V
1 series · 1 of 1 positions shown · non-contrast
Comparison: CT 08/23/2014.

CLINICAL DATA: Abdominal pain.  Diarrhea.  Cramping.

EXAM:
ABDOMEN - 1 VIEW

[kub ap]
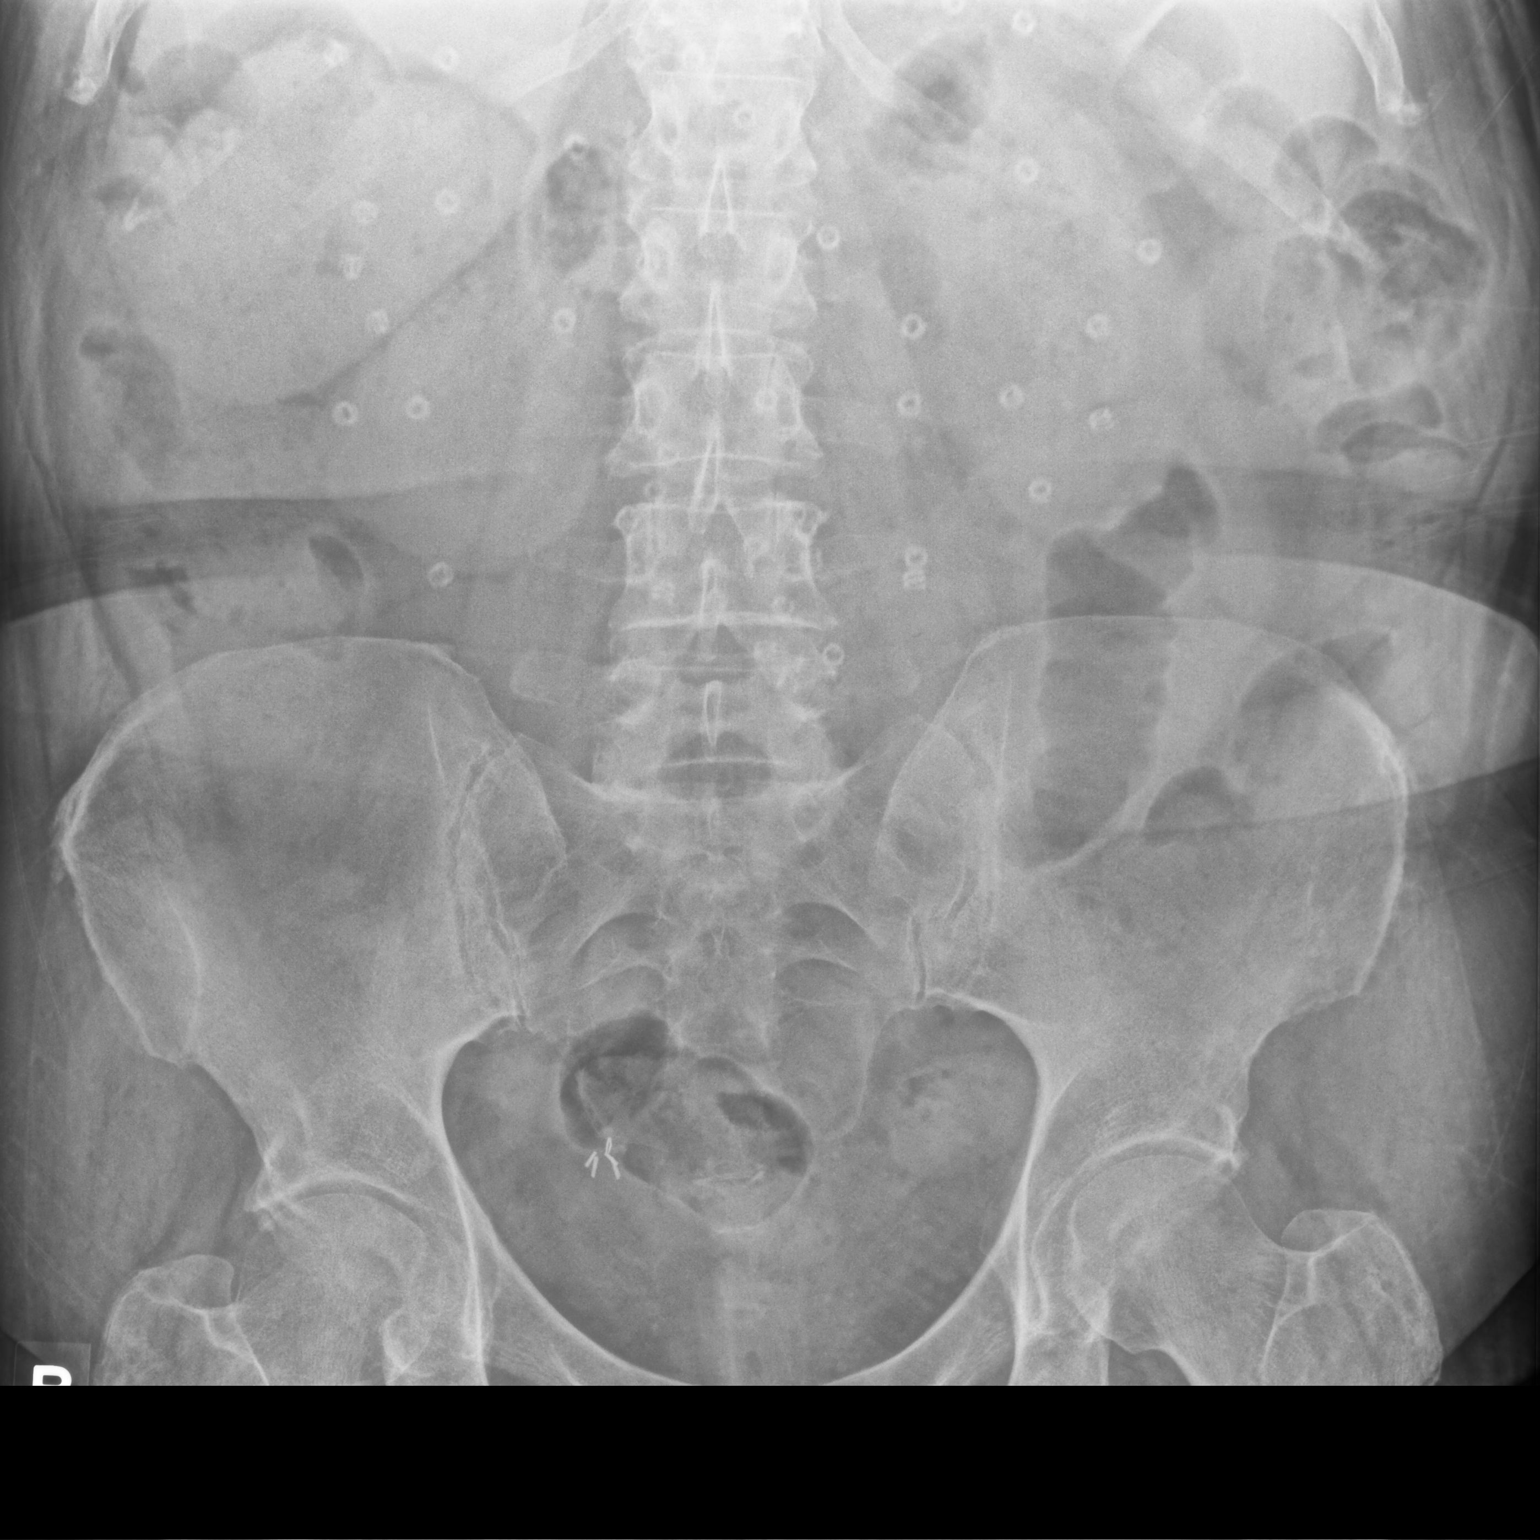

[1 of 1 positions shown; findings below may reference images not displayed]

FINDINGS: Surgical staples and clips noted over the abdomen. Single slightly
dilated loop of small bowel noted over the left mid abdomen.
Follow-up exam suggested to demonstrate resolution. Colonic gas
pattern is normal. Stool noted throughout the colon. No free air.
Degenerative changes lumbar spine and both hips.
IMPRESSION: 1.  Surgical staples and clips noted over the abdomen.

2. Single slightly dilated loop of small bowel noted over the left
mid abdomen. Follow-up exam suggested to demonstrate resolution.
Colonic gas pattern is normal. Stool noted throughout colon. No free
air.

## 2018-11-09 MED ORDER — SULFAMETHOXAZOLE-TRIMETHOPRIM 800-160 MG PO TABS: 1.0000 | ORAL_TABLET | Freq: Two times a day (BID) | ORAL | 0 refills | Status: DC

## 2018-11-09 NOTE — Progress Notes (Signed)
Subjective:  Patient ID: [name], female    DOB: Dec 13, 1960,  MRN: [id number]  Chief Complaint  Patient presents with  . Routine Post Op    DOS 12.23.19 hammertoe repair 2,3,4 LT    DOS: 10/02/18 Procedure: Hammertoe repair 2/3/4 left foot   57 y.o. female returns for post-op check. States that her foot has bene swollen since last visit and has had increasing pain or soreness across the top of the foot with redness.  Concerned that she has MRSA due to a history of this.  Review of Systems: Negative except as noted in the HPI. Denies N/V/F/Ch.  Past Medical History:  Diagnosis Date  . Abnormal SPEP 04/17/2014  . Acute left ankle pain 01/26/2017  . ANEMIA-UNSPECIFIED 09/18/2009  . Anxiety   . Arthritis   . CHF (congestive heart failure) ([place])   . Chronic diastolic heart failure, NYHA class 2 (HCC)    Normal LVEDP by May 2018  . COPD (chronic obstructive pulmonary disease) ([place])   . Depression   . DIABETES MELLITUS, TYPE II 08/21/2006  . Diabetic osteomyelitis ([place]) 05/29/2015  . Fracture of 5th metatarsal    non union  . GERD 59/56/3875   Had fundoplication  . GOUT 08/20/2010  . Hx of umbilical hernia repair   . HYPERLIPIDEMIA 08/21/2006  . HYPERTENSION 08/21/2006  . Infection of wound due to methicillin resistant Staphylococcus aureus (MRSA)   . Internal hemorrhoids   . Multiple allergies 10/14/2016  . OBESITY 06/04/2009  . Onychomycosis 10/27/2015  . Osteomyelitis of left foot ([place]) 05/29/2015  . Pulmonary sarcoidosis ([place])    Followed locally by pulmonology, but also by Dr. Casper Harrison at [facility]  . Right knee pain 01/26/2017  . Vocal cord dysfunction   . Wears partial dentures     Current Outpatient Medications:  .  albuterol (PROVENTIL HFA;VENTOLIN HFA) 108 (90 Base) MCG/ACT inhaler, Inhale 1-2 puffs into the lungs every 6 (six) hours as needed for wheezing or shortness of breath., Disp: 8 g, Rfl: 5 .  allopurinol (ZYLOPRIM) 100 MG tablet, Take 100  mg by mouth 2 (two) times daily. , Disp: , Rfl:  .  aspirin EC 81 MG tablet, Take 81 mg by mouth daily. , Disp: , Rfl:  .  atorvastatin (LIPITOR) 40 MG tablet, TAKE 1 TAB BY MOUTH ONCE DAILY, Disp: 90 tablet, Rfl: 3 .  BAYER MICROLET LANCETS lancets, Use as instructed to check sugar 3 times daily, Disp: 300 each, Rfl: 5 .  buPROPion (WELLBUTRIN XL) 300 MG 24 hr tablet, Take 1 tablet (300 mg total) by mouth daily., Disp: 90 tablet, Rfl: 3 .  Calcium Citrate 200 MG TABS, Take 200 mg by mouth daily., Disp: , Rfl:  .  carvedilol (COREG) 25 MG tablet, TAKE 1 TABLET (25 MG TOTAL) BY MOUTH 2 (TWO) TIMES DAILY WITH A MEAL., Disp: 180 tablet, Rfl: 3 .  Cholecalciferol (VITAMIN D) 2000 units tablet, Take 2,000 Units by mouth daily., Disp: , Rfl:  .  Cyanocobalamin (VITAMIN B-12 PO), Take 2,500 mcg by mouth daily. , Disp: , Rfl:  .  dexlansoprazole (DEXILANT) 60 MG capsule, Take 1 capsule (60 mg total) by mouth daily., Disp: 30 capsule, Rfl: 5 .  diltiazem (CARDIZEM CD) 120 MG 24 hr capsule, TAKE 1 CAPSULE BY MOUTH EVERY DAY (FOR FUTURE REFILLS MAKE A DOCTORS VISIT) (Patient taking differently: Take 120 mg by mouth daily. ), Disp: 90 capsule, Rfl: 3 .  fenofibrate (TRICOR) 145 MG tablet, TAKE 1 TABLET  BY MOUTH EVERY DAY, Disp: 30 tablet, Rfl: 11 .  fluticasone (FLONASE) 50 MCG/ACT nasal spray, PLACE 2 SPRAYS INTO BOTH NOSTRILS DAILY AS NEEDED FOR ALLERGIES, Disp: 48 g, Rfl: 1 .  fluticasone furoate-vilanterol (BREO ELLIPTA) 200-25 MCG/INH AEPB, Inhale 1 puff into the lungs daily., Disp: , Rfl:  .  furosemide (LASIX) 40 MG tablet, TAKE 1 TABLET BY MOUTH TWICE A DAY, Disp: 180 tablet, Rfl: 3 .  gabapentin (NEURONTIN) 100 MG capsule, TAKE 1 CAPSULE BY MOUTH THREE TIMES A DAY, Disp: 270 capsule, Rfl: 0 .  glucose blood (BAYER CONTOUR TEST) test strip, Use as instructed to check sugar 3 times daily, Disp: 300 each, Rfl: 5 .  insulin aspart (NOVOLOG FLEXPEN) 100 UNIT/ML FlexPen, Inject 40-45 Units into the skin 3  (three) times daily with meals., Disp: 20 pen, Rfl: 5 .  Insulin Glargine, 1 Unit Dial, (TOUJEO SOLOSTAR) 300 UNIT/ML SOPN, Inject 25 Units into the skin at bedtime. (Patient taking differently: Inject 40 Units into the skin at bedtime. ), Disp: 5 pen, Rfl:  .  Insulin Pen Needle 33G X 4 MM MISC, 1 each by Does not apply route 4 (four) times daily., Disp: 200 each, Rfl: 11 .  ipratropium (ATROVENT) 0.06 % nasal spray, Place 2 sprays into both nostrils 4 (four) times daily. (Patient taking differently: Place 2 sprays into both nostrils 2 (two) times daily as needed for rhinitis. ), Disp: 15 mL, Rfl: 0 .  isosorbide mononitrate (IMDUR) 30 MG 24 hr tablet, Take 1 tablet (30 mg total) by mouth at bedtime., Disp: 90 tablet, Rfl: 3 .  KLOR-CON M20 20 MEQ tablet, Take 30 mEq by mouth 2 (two) times daily. , Disp: , Rfl: 2 .  linaclotide (LINZESS) 290 MCG CAPS capsule, Take 1 capsule (290 mcg total) by mouth daily before breakfast. (Patient taking differently: Take 290 mcg by mouth every other day. ), Disp: 30 capsule, Rfl: 11 .  LORazepam (ATIVAN) 0.5 MG tablet, Take 0.5 mg by mouth every 12 (twelve) hours as needed for anxiety., Disp: , Rfl:  .  metFORMIN (GLUCOPHAGE) 1000 MG tablet, TAKE 1 TABLET BY MOUTH TWICE A DAY WITH MEALS, Disp: 180 tablet, Rfl: 0 .  nitroGLYCERIN (NITROSTAT) 0.4 MG SL tablet, Place 1 tablet (0.4 mg total) under the tongue every 5 (five) minutes as needed for chest pain. Reported on 01/05/2016, Disp: 25 tablet, Rfl: 6 .  ondansetron (ZOFRAN-ODT) 4 MG disintegrating tablet, Take 1 tablet (4 mg total) by mouth every 8 (eight) hours as needed for nausea or vomiting., Disp: 20 tablet, Rfl: 1 .  oxyCODONE-acetaminophen (PERCOCET) 5-325 MG tablet, Take 1 tablet by mouth every 6 (six) hours as needed for severe pain., Disp: 30 tablet, Rfl: 0 .  PARoxetine Mesylate (BRISDELLE) 7.5 MG CAPS, Take 7.5 mg by mouth daily. , Disp: , Rfl:  .  predniSONE (DELTASONE) 5 MG tablet, Take 5 mg by mouth daily  with breakfast., Disp: , Rfl:  .  sulfamethoxazole-trimethoprim (BACTRIM DS,SEPTRA DS) 800-160 MG tablet, Take 1 tablet by mouth 2 (two) times daily., Disp: 14 tablet, Rfl: 0 .  telmisartan (MICARDIS) 20 MG tablet, TAKE 1 TABLET BY MOUTH EVERY DAY, Disp: 90 tablet, Rfl: 2 .  triamcinolone cream (KENALOG) 0.1 %, Apply 1 application topically 2 (two) times daily as needed (for rash. stop after 7-10 days.)., Disp: 80 g, Rfl: 0 .  venlafaxine XR (EFFEXOR-XR) 75 MG 24 hr capsule, Take 1 capsule (75 mg total) by mouth 2 (two) times daily., Disp: ,  Rfl:   Social History   Tobacco Use  Smoking Status Never Smoker  Smokeless Tobacco Never Used    Allergies  Allergen Reactions  . Methotrexate Other (See Comments)    Peri-oral and buccal lesions  . Vancomycin Other (See Comments)    DOSE RELATED NEPHROTOXICITY  . Lisinopril Cough  . Chlorhexidine Itching  . Clindamycin/Lincomycin Nausea And Vomiting and Rash  . Doxycycline Rash  . Teflaro [Ceftaroline] Rash and Other (See Comments)    Tolerates ceftriaxone    Objective:  There were no vitals filed for this visit. There is no height or weight on file to calculate BMI. Constitutional Well developed. Well nourished.  Vascular Foot warm and well perfused. Capillary refill normal to all digits.   Neurologic Normal speech. Oriented to person, place, and time. Epicritic sensation to light touch grossly present bilaterally.  Dermatologic Skin well-healed.  Blanchable erythema to the distal forefoot about the toes.  Local warmth.  No ascending cellulitis or streaking  Orthopedic: Tenderness to palpation noted about the surgical site.   Radiographs: Taken and reviewed consistent with postop state interval removal of pins since last films no acute fractures dislocations Assessment:   1. Hammertoe of left foot   2. Status post left foot surgery    Plan:  Patient was evaluated and treated and all questions answered.  S/p foot surgery  left -Progressing as expected post-operatively. -XR: None -WB Status: WBAT in normal shoegear -Sutures: out. -Medications: Rx Bactrim for possible cellulitis -Discussed with patient that while her symptoms are likely due to recent transition to weightbearing and transition to normal shoe gear there is a slight warmth and redness to her foot that could be concerning for some local skin infection.  Antibiotic prescribed to see if it reduces the erythema and have patient follow-up Dr. Amalia Hailey prior to surgery on her other foot.  Return for [place] Patient.

## 2018-11-09 NOTE — Telephone Encounter (Signed)
[name] called from [facility] [employer] on behalf of [employer]. State's patients surgery request has been denied because there is no indication patient tried any conservative treatment in the submitted notes. Should Dr. Amalia Hailey request to go to [facility], the phone number is [phone].

## 2018-11-12 NOTE — Progress Notes (Signed)
   Subjective:  Patient presents today status post left forefoot reconstruction surgery. DOS: 10/02/18. She states she is doing well. She reports minimal tenderness. There are no modifying factors noted. She states that one of the pins came out. She has been using the CAM boot as directed. Patient is here for further evaluation and treatment.    Past Medical History:  Diagnosis Date  . Abnormal SPEP 04/17/2014  . Acute left ankle pain 01/26/2017  . ANEMIA-UNSPECIFIED 09/18/2009  . Anxiety   . Arthritis   . CHF (congestive heart failure) ([place])   . Chronic diastolic heart failure, NYHA class 2 (HCC)    Normal LVEDP by May 2018  . COPD (chronic obstructive pulmonary disease) ([place])   . Depression   . DIABETES MELLITUS, TYPE II 08/21/2006  . Diabetic osteomyelitis ([place]) 05/29/2015  . Fracture of 5th metatarsal    non union  . GERD 92/42/6834   Had fundoplication  . GOUT 08/20/2010  . Hx of umbilical hernia repair   . HYPERLIPIDEMIA 08/21/2006  . HYPERTENSION 08/21/2006  . Infection of wound due to methicillin resistant Staphylococcus aureus (MRSA)   . Internal hemorrhoids   . Multiple allergies 10/14/2016  . OBESITY 06/04/2009  . Onychomycosis 10/27/2015  . Osteomyelitis of left foot ([place]) 05/29/2015  . Pulmonary sarcoidosis ([place])    Followed locally by pulmonology, but also by Dr. Casper Harrison at [facility]  . Right knee pain 01/26/2017  . Vocal cord dysfunction   . Wears partial dentures       Objective/Physical Exam Neurovascular status intact.  Skin incisions appear to be well coapted. No sign of infectious process noted. No dehiscence. No active bleeding noted. Moderate edema noted to the surgical extremity. Hammertoe contracture deformity noted to digits 2-5 of the right foot.  Radiographic Exam:  Orthopedic hardware and osteotomies sites appear to be stable with routine healing.  Assessment: 1. s/p left forefoot reconstruction surgery. DOS: 10/02/18 2. Hammertoes  digits 2-5 right   Plan of Care:  1. Patient was evaluated. X-Rays reviewed.  2. Pins removed.  3. Transition into good shoe gear. Discontinue using CAM boot.  4. Patient scheduled for surgery on right foot in February.  The patient has tried multiple conservative modalities to alleviate her symptoms the hammertoe deformities the right foot including shoe gear modifications and anti-inflammatories without any satisfactory alleviation of symptoms for the patient. 5. Return to clinic one week post op.     Edrick Kins, DPM [facility]  Dr. Edrick Kins, [address]                                        [place], [place] [address]                Office [phone]  Fax [phone]

## 2018-11-13 ENCOUNTER — Ambulatory Visit (INDEPENDENT_AMBULATORY_CARE_PROVIDER_SITE_OTHER): Payer: Medicare Other | Admitting: Podiatry

## 2018-11-13 ENCOUNTER — Ambulatory Visit (INDEPENDENT_AMBULATORY_CARE_PROVIDER_SITE_OTHER): Payer: 59

## 2018-11-13 ENCOUNTER — Telehealth: Payer: Self-pay | Admitting: *Deleted

## 2018-11-13 DIAGNOSIS — M205X2 Other deformities of toe(s) (acquired), left foot: Secondary | ICD-10-CM

## 2018-11-13 DIAGNOSIS — M2041 Other hammer toe(s) (acquired), right foot: Secondary | ICD-10-CM | POA: Diagnosis not present

## 2018-11-13 DIAGNOSIS — Z9889 Other specified postprocedural states: Secondary | ICD-10-CM

## 2018-11-13 NOTE — Telephone Encounter (Signed)
"  I'm calling to make sure you all have the right insurance for my wife's surgery that is scheduled for Thursday.  It changed this year."  Does she have [facility] and Medicare?  Yes, that is correct."  It is still pending with her insurance company.  If it's not authorized, we will not be able to perform the surgery.  "Okay, thank you so much."  "When will you know if the surgery is authorized or not?"  I can not give you a specific date.  I'll check on Wednesday.  If it's not authorized then, we'll have to reschedule the surgery.  "So you or someone else will call and let us know if the surgery is canceled?"  Yes, I will let you know or someone from the surgical center.

## 2018-11-14 NOTE — Telephone Encounter (Signed)
"  This is [employer].  I just got a call from the surgical center.  They said my wife's surgery has not been approved."  Yes, that is correct.  It was denied.  They did say a peer to peer could be done by the physician.  So, we are informing Dr. Amalia Hailey.  "So for now it's not approved, so the surgery is canceled for Thursday.  You are going to continue to try and get it approved.  You will let us know if it gets approved?"  Yes, that is correct.  "Okay, thank you so much."

## 2018-11-14 NOTE — Telephone Encounter (Signed)
Pincus Sanes with [employer] called and stated prior authorization is denied. Stated a peer to peer can be done.

## 2018-11-16 NOTE — Progress Notes (Signed)
   Subjective:  Patient presents today status post left forefoot reconstructive surgery. DOS: 10/02/18. She reports some pain to the foot. She has been taking the antibiotics as directed. There are no modifying factors noted. Patient is here for further evaluation and treatment.    Past Medical History:  Diagnosis Date  . Abnormal SPEP 04/17/2014  . Acute left ankle pain 01/26/2017  . ANEMIA-UNSPECIFIED 09/18/2009  . Anxiety   . Arthritis   . CHF (congestive heart failure) ([place])   . Chronic diastolic heart failure, NYHA class 2 (HCC)    Normal LVEDP by May 2018  . COPD (chronic obstructive pulmonary disease) ([place])   . Depression   . DIABETES MELLITUS, TYPE II 08/21/2006  . Diabetic osteomyelitis ([place]) 05/29/2015  . Fracture of 5th metatarsal    non union  . GERD 49/82/6415   Had fundoplication  . GOUT 08/20/2010  . Hx of umbilical hernia repair   . HYPERLIPIDEMIA 08/21/2006  . HYPERTENSION 08/21/2006  . Infection of wound due to methicillin resistant Staphylococcus aureus (MRSA)   . Internal hemorrhoids   . Multiple allergies 10/14/2016  . OBESITY 06/04/2009  . Onychomycosis 10/27/2015  . Osteomyelitis of left foot ([place]) 05/29/2015  . Pulmonary sarcoidosis ([place])    Followed locally by pulmonology, but also by Dr. Casper Harrison at [facility]  . Right knee pain 01/26/2017  . Vocal cord dysfunction   . Wears partial dentures       Objective/Physical Exam Neurovascular status intact.  Skin incisions appear to be well coapted. No sign of infectious process noted. No dehiscence. No active bleeding noted. Moderate edema noted to the surgical extremity. Edema noted to digits 2-5 of the left foot.   Radiographic Exam:  Orthopedic hardware and osteotomies sites appear to be stable with routine healing.  Assessment: 1. s/p left forefoot reconstructive surgery. DOS: 10/02/18   Plan of Care:  1. Patient was evaluated. X-rays reviewed 2. Continue taking Bactrim DS as prescribed.    3. Recommended good shoe gear.  4. Patient scheduled for surgery on the right foot on 11/16/2018, this Thursday.  5. Return to clinic one week post op.    Edrick Kins, DPM [facility]  Dr. Edrick Kins, [place]                                        [place], [place] [address]                Office [phone]  Fax [phone]

## 2018-11-20 ENCOUNTER — Ambulatory Visit (INDEPENDENT_AMBULATORY_CARE_PROVIDER_SITE_OTHER): Payer: 59 | Admitting: Podiatry

## 2018-11-20 ENCOUNTER — Encounter: Payer: Self-pay | Admitting: Podiatry

## 2018-11-20 DIAGNOSIS — M79671 Pain in right foot: Secondary | ICD-10-CM

## 2018-11-20 DIAGNOSIS — Z9889 Other specified postprocedural states: Secondary | ICD-10-CM

## 2018-11-20 DIAGNOSIS — M205X2 Other deformities of toe(s) (acquired), left foot: Secondary | ICD-10-CM

## 2018-11-20 DIAGNOSIS — M2042 Other hammer toe(s) (acquired), left foot: Secondary | ICD-10-CM

## 2018-11-20 DIAGNOSIS — M2041 Other hammer toe(s) (acquired), right foot: Secondary | ICD-10-CM

## 2018-11-20 DIAGNOSIS — M7751 Other enthesopathy of right foot: Secondary | ICD-10-CM | POA: Diagnosis not present

## 2018-11-20 MED ORDER — NAPROXEN 500 MG PO TABS: 500.0000 mg | ORAL_TABLET | Freq: Two times a day (BID) | ORAL | 1 refills | Status: DC

## 2018-11-21 ENCOUNTER — Telehealth: Payer: Self-pay | Admitting: *Deleted

## 2018-11-21 NOTE — Telephone Encounter (Signed)
"  I am calling to see if my wife's surgery has been authorized.  I know the doctor is supposed to be doing an appeal.  Please give her a call and give her an update."

## 2018-11-23 LAB — HM DIABETES EYE EXAM

## 2018-11-24 DIAGNOSIS — E119 Type 2 diabetes mellitus without complications: Secondary | ICD-10-CM | POA: Diagnosis not present

## 2018-11-24 DIAGNOSIS — H524 Presbyopia: Secondary | ICD-10-CM | POA: Diagnosis not present

## 2018-11-24 DIAGNOSIS — Z961 Presence of intraocular lens: Secondary | ICD-10-CM | POA: Diagnosis not present

## 2018-11-24 DIAGNOSIS — H52203 Unspecified astigmatism, bilateral: Secondary | ICD-10-CM | POA: Diagnosis not present

## 2018-11-24 NOTE — Telephone Encounter (Signed)
"  We're calling to get an update."  Dr. Amalia Hailey is still working on doing the appeal.  We have not heard anything yet but we will call you once we hear something from your insurance company.

## 2018-11-25 NOTE — Progress Notes (Signed)
   Subjective:  Patient presents today status post left forefoot reconstructive surgery. DOS: 10/02/18.  Patient continues to have symptoms to the right foot hammertoes despite conservative management including shoe gear modifications and anti-inflammatories.  Patient was originally scheduled for surgery on 11/16/2018 however this was denied by insurance stating that there is been no conservative measurements to alleviate the patient's symptoms to the hammertoes of the right foot.  There has indeed been conservative management as mentioned above.  Past Medical History:  Diagnosis Date  . Abnormal SPEP 04/17/2014  . Acute left ankle pain 01/26/2017  . ANEMIA-UNSPECIFIED 09/18/2009  . Anxiety   . Arthritis   . CHF (congestive heart failure) ([place])   . Chronic diastolic heart failure, NYHA class 2 (HCC)    Normal LVEDP by May 2018  . COPD (chronic obstructive pulmonary disease) ([place])   . Depression   . DIABETES MELLITUS, TYPE II 08/21/2006  . Diabetic osteomyelitis ([place]) 05/29/2015  . Fracture of 5th metatarsal    non union  . GERD 02/54/2706   Had fundoplication  . GOUT 08/20/2010  . Hx of umbilical hernia repair   . HYPERLIPIDEMIA 08/21/2006  . HYPERTENSION 08/21/2006  . Infection of wound due to methicillin resistant Staphylococcus aureus (MRSA)   . Internal hemorrhoids   . Multiple allergies 10/14/2016  . OBESITY 06/04/2009  . Onychomycosis 10/27/2015  . Osteomyelitis of left foot ([place]) 05/29/2015  . Pulmonary sarcoidosis ([place])    Followed locally by pulmonology, but also by Dr. Casper Harrison at [facility]  . Right knee pain 01/26/2017  . Vocal cord dysfunction   . Wears partial dentures       Objective/Physical Exam Neurovascular status intact.  Skin incisions are healed.  No open wounds noted.  Moderate edema still noted throughout the digits of the surgical foot left lower extremity.  Right foot hammertoes noted digits 2-5 right foot.  Patient continues to have significant pain  on palpation and range of motion to these hammertoe deformities.  Assessment: 1. s/p left forefoot reconstructive surgery. DOS: 10/02/18 2.  Hammertoe deformity digits 2-5 right foot   Plan of Care:  1. Patient was evaluated.  2.  Insurance denied patient's right foot surgery.  Today we will request an appeal for insurance to perform surgery to the right foot.   3.  Prescription for naproxen 500 mg 2 times daily  4.  Continue wearing good supportive shoe gear  5.  Authorization for surgery was initiated on 10/09/2018 for the right foot.  We will continue with the same surgical plan once insurance approves.  The patient has tried multiple conservative modalities to alleviate the patient's symptoms without any satisfactory alleviation.  Patient is tried shoe gear modification and anti-inflammatories.  Surgery will consist of PIPJ arthroplasty with possible internal screw fixation digits 2, 3 and 4 right; PIPJ arthroplasty digit 5 right.    Edrick Kins, DPM [facility]  Dr. Edrick Kins, [place]                                        [place], [place] [address]                Office [phone]  Fax [phone]

## 2018-11-26 ENCOUNTER — Other Ambulatory Visit: Payer: Self-pay | Admitting: Family Medicine

## 2018-11-27 ENCOUNTER — Other Ambulatory Visit: Payer: Medicare Other

## 2018-11-28 ENCOUNTER — Other Ambulatory Visit: Payer: Self-pay | Admitting: Endocrinology

## 2018-11-29 ENCOUNTER — Other Ambulatory Visit: Payer: Self-pay | Admitting: Endocrinology

## 2018-11-29 NOTE — Telephone Encounter (Signed)
please contact patient: 1. How many units do you take? 2. How are cbg's doing?

## 2018-11-29 NOTE — Telephone Encounter (Signed)
-----   Message from Edrick Kins, DPM sent at 11/28/2018 12:42 PM EST ----- Regarding: Peer-to-peer appt Would you please contact the patient and let her know a Peer-to-peer has been set up for tomorrow @ 12:40PM. Hopefully surgery will be approved then. We will contact her tomorrow and let her know. Thanks, Dr. Amalia Hailey

## 2018-11-29 NOTE — Telephone Encounter (Signed)
I left a message for Mrs. Dorvil.  I informed her that Dr. Amalia Hailey is scheduled to do a peer-to-peer this afternoon.  I told her we will call with the results as soon as we can.

## 2018-11-29 NOTE — Telephone Encounter (Signed)
Please verify dosage.

## 2018-11-30 ENCOUNTER — Encounter: Payer: Self-pay | Admitting: Family Medicine

## 2018-11-30 ENCOUNTER — Other Ambulatory Visit: Payer: Self-pay | Admitting: Cardiology

## 2018-11-30 NOTE — Telephone Encounter (Signed)
I sent rx

## 2018-11-30 NOTE — Telephone Encounter (Signed)
Called pt for clarification. States:  1. CBG's range 150-160 2. Spouse's insurance has changed to [facility] and will no longer cover Novolog Flexpen. Will now cover Humalog Flexpen. 3. Taking 25 units total per day. States she takes TID and it is a sliding scale with 25 units being the max TOTAL dosage

## 2018-12-04 ENCOUNTER — Ambulatory Visit (INDEPENDENT_AMBULATORY_CARE_PROVIDER_SITE_OTHER): Payer: 59 | Admitting: Family Medicine

## 2018-12-04 ENCOUNTER — Ambulatory Visit: Payer: BLUE CROSS/BLUE SHIELD

## 2018-12-04 ENCOUNTER — Encounter: Payer: Self-pay | Admitting: Family Medicine

## 2018-12-04 ENCOUNTER — Ambulatory Visit (INDEPENDENT_AMBULATORY_CARE_PROVIDER_SITE_OTHER): Payer: Medicare Other | Admitting: Orthopedic Surgery

## 2018-12-04 VITALS — BP 110/60 | HR 83 | Temp 97.8°F | Ht 66.0 in | Wt 262.0 lb

## 2018-12-04 DIAGNOSIS — R1013 Epigastric pain: Secondary | ICD-10-CM | POA: Diagnosis not present

## 2018-12-04 DIAGNOSIS — J449 Chronic obstructive pulmonary disease, unspecified: Secondary | ICD-10-CM

## 2018-12-04 DIAGNOSIS — J329 Chronic sinusitis, unspecified: Secondary | ICD-10-CM | POA: Diagnosis not present

## 2018-12-04 DIAGNOSIS — R05 Cough: Secondary | ICD-10-CM

## 2018-12-04 DIAGNOSIS — J4489 Other specified chronic obstructive pulmonary disease: Secondary | ICD-10-CM

## 2018-12-04 DIAGNOSIS — R059 Cough, unspecified: Secondary | ICD-10-CM

## 2018-12-04 DIAGNOSIS — R6883 Chills (without fever): Secondary | ICD-10-CM

## 2018-12-04 DIAGNOSIS — B9689 Other specified bacterial agents as the cause of diseases classified elsewhere: Secondary | ICD-10-CM

## 2018-12-04 DIAGNOSIS — D860 Sarcoidosis of lung: Secondary | ICD-10-CM

## 2018-12-04 LAB — CBC WITH DIFFERENTIAL/PLATELET
Basophils Absolute: 0 10*3/uL (ref 0.0–0.1)
Basophils Relative: 0.5 % (ref 0.0–3.0)
Eosinophils Absolute: 0.3 10*3/uL (ref 0.0–0.7)
Eosinophils Relative: 3.1 % (ref 0.0–5.0)
HCT: 29.2 % — ABNORMAL LOW (ref 36.0–46.0)
Hemoglobin: 9.7 g/dL — ABNORMAL LOW (ref 12.0–15.0)
Lymphocytes Relative: 17.2 % (ref 12.0–46.0)
Lymphs Abs: 1.6 10*3/uL (ref 0.7–4.0)
MCHC: 33.1 g/dL (ref 30.0–36.0)
MCV: 82.7 fl (ref 78.0–100.0)
Monocytes Absolute: 0.5 10*3/uL (ref 0.1–1.0)
Monocytes Relative: 5.5 % (ref 3.0–12.0)
Neutro Abs: 6.7 10*3/uL (ref 1.4–7.7)
Neutrophils Relative %: 73.7 % (ref 43.0–77.0)
Platelets: 293 10*3/uL (ref 150.0–400.0)
RBC: 3.53 Mil/uL — ABNORMAL LOW (ref 3.87–5.11)
RDW: 14.8 % (ref 11.5–15.5)
WBC: 9.1 10*3/uL (ref 4.0–10.5)

## 2018-12-04 LAB — COMPREHENSIVE METABOLIC PANEL
ALT: 22 U/L (ref 0–35)
AST: 21 U/L (ref 0–37)
Albumin: 4.1 g/dL (ref 3.5–5.2)
Alkaline Phosphatase: 93 U/L (ref 39–117)
BUN: 23 mg/dL (ref 6–23)
CO2: 30 mEq/L (ref 19–32)
Calcium: 9 mg/dL (ref 8.4–10.5)
Chloride: 100 mEq/L (ref 96–112)
Creatinine, Ser: 1.16 mg/dL (ref 0.40–1.20)
GFR: 47.76 mL/min — ABNORMAL LOW (ref >60.00)
Glucose, Bld: 302 mg/dL — ABNORMAL HIGH (ref 70–99)
Potassium: 4.3 mEq/L (ref 3.5–5.1)
Sodium: 138 mEq/L (ref 135–145)
Total Bilirubin: 0.5 mg/dL (ref 0.2–1.2)
Total Protein: 6.3 g/dL (ref 6.0–8.3)

## 2018-12-04 LAB — POCT INFLUENZA A/B
Influenza A, POC: NEGATIVE
Influenza B, POC: NEGATIVE

## 2018-12-04 LAB — LIPASE: Lipase: 13 U/L (ref 11.0–59.0)

## 2018-12-04 MED ORDER — PREDNISONE 50 MG PO TABS: 50.0000 mg | ORAL_TABLET | Freq: Every day | ORAL | 0 refills | Status: AC

## 2018-12-04 MED ORDER — DOXYCYCLINE HYCLATE 100 MG PO TABS: 100.0000 mg | ORAL_TABLET | Freq: Two times a day (BID) | ORAL | 0 refills | Status: DC

## 2018-12-04 NOTE — Patient Instructions (Addendum)
Sinus infection/Sinusitis Also with flare up of obstructive chronic bronchitis/sarcoidosis of lungs Bacterial based on double sickening, or severe symptoms in first 3 days  Treatment: -symptomatic care with  Sinus rinses like a Neti Pot or Neilmed sinus rinse (make sure to follow instructions for water preparation Tylenol 650 mg every 6 hours to help with sinus pressure -Antibiotic indicated: yes, does well with doxycycline- allergy of rash listed but she has done better lately - on 5 mg prednisone chronically- will give 5 day course of 50mg  to try to calm down flare up of lungs. Discussed mild poor control of diabetes on last check and being careful about blood sugar rising higher as a result of prednisone Lab Results  Component Value Date   HGBA1C 8.2 (A) 08/25/2018   She also reports epigastric abdominal pain after meals for 2 weeks and worsening heartburn despite Dexilant.  We will check some baseline labs and I encouraged follow up with Dr. [name] for heart burn portion- if he does not think primary GI issue- would need to see her back. If she has worsening pain and cannot get into see him- would want to see her back and would consider CT abdomen/pelvis to rule out other disease.   Flu swab negative in relation to recent flu exposure- she will need to see [place] back if has new or worsening symptoms despite treatment  Finally, we reviewed reasons to return to care including if symptoms worsen or persist  (despite above treatments) or new concerns arise (particularly fever or shortness of breath)  Meds ordered this encounter  Medications  . doxycycline (VIBRA-TABS) 100 MG tablet    Sig: Take 1 tablet (100 mg total) by mouth 2 (two) times daily for 7 days.    Dispense:  14 tablet    Refill:  0  . predniSONE (DELTASONE) 50 MG tablet    Sig: Take 1 tablet (50 mg total) by mouth daily with breakfast for 5 days.    Dispense:  5 tablet    Refill:  0

## 2018-12-04 NOTE — Progress Notes (Signed)
PCP: [name], MD  Subjective:  [name] is a 58 y.o. year old very pleasant female patient who presents with sinusitis symptoms including nasal congestion, sinus tenderness and headaches, green nasal discharge -other symptoms include: also has chest congestion, wheezing,shortness of breath- gets temporary relief with breathing treatments/albuterol. Has some chills -day of illness:7-8 -Symptoms are worsening still instead of improvign -previous treatments: tussionex, tylenol, albuterol -sick contacts/travel/risks: endorses flu exposure over weekend- wants to be tested  ROS-denies fever,  nausea, vomiting, diarrhea, increased edema  Pertinent Past Medical History-  Patient Active Problem List   Diagnosis Date Noted  . Constipation 04/21/2018    Priority: High  . Obstructive chronic bronchitis without exacerbation ([place]) 09/18/2013    Priority: High  . Chest pain 04/11/2013    Priority: High  . Hypertensive heart disease with chronic diastolic congestive heart failure ([place]     Priority: High  . Sarcoidosis of lung ([place]) 04/10/2007    Priority: High  . Type II diabetes mellitus with renal manifestations ([place]) 08/21/2006    Priority: High  . Epiglottitis     Priority: Medium  . Osteoarthritis of right knee 01/26/2017    Priority: Medium  . CKD (chronic kidney disease) stage 3, GFR 30-59 ml/min (HCC) 04/27/2015    Priority: Medium  . History of osteomyelitis 03/27/2015    Priority: Medium  . Fatty liver 09/30/2014    Priority: Medium  . Depression     Priority: Medium  . Gout 08/20/2010    Priority: Medium  . Anemia 09/18/2009    Priority: Medium  . Sleep apnea 04/21/2009    Priority: Medium  . Hyperlipidemia 08/21/2006    Priority: Medium  . Essential hypertension 08/21/2006    Priority: Medium  . Total knee replacement status, left 12/07/2017    Priority: Low  . Dysphagia 09/20/2017    Priority: Low  . Plantar fasciitis, right 07/13/2017   Priority: Low  . S/P total knee arthroplasty, right 06/29/2017    Priority: Low  . Osteoarthrosis, localized, primary, knee, right     Priority: Low  . Sprain of calcaneofibular ligament of right ankle 06/06/2017    Priority: Low  . Onychomycosis 10/27/2015    Priority: Low  . MRSA (methicillin resistant staph aureus) culture positive 03/27/2015    Priority: Low  . Hot flashes 07/15/2014    Priority: Low  . Abnormal SPEP 04/17/2014    Priority: Low  . Fracture of left leg 04/17/2014    Priority: Low  . Cushingoid side effect of steroids ([place]) 04/17/2014    Priority: Low  . Internal hemorrhoids     Priority: Low  . Preoperative clearance 03/25/2014    Priority: Low  . Solitary pulmonary nodule, on CT 02/2013 - stable over 2 years in 2015 02/20/2013    Priority: Low  . Morbid obesity ([place]) 06/04/2009    Priority: Low  . GERD 08/21/2006    Priority: Low  . Subcortical microvascular ischemic occlusive disease 07/12/2018  . Rapid palpitations 07/12/2018  . Impingement of left ankle joint 06/26/2018  . Anxiety 04/05/2018    Medications- reviewed  Current Outpatient Medications  Medication Sig Dispense Refill  . albuterol (PROVENTIL HFA;VENTOLIN HFA) 108 (90 Base) MCG/ACT inhaler Inhale 1-2 puffs into the lungs every 6 (six) hours as needed for wheezing or shortness of breath. 8 g 5  . allopurinol (ZYLOPRIM) 100 MG tablet Take 100 mg by mouth 2 (two) times daily.     [name] aspirin EC 81 MG  tablet Take 81 mg by mouth daily.     [name] atorvastatin (LIPITOR) 40 MG tablet TAKE 1 TAB BY MOUTH ONCE DAILY 90 tablet 3  . BAYER MICROLET LANCETS lancets Use as instructed to check sugar 3 times daily 300 each 5  . buPROPion (WELLBUTRIN XL) 300 MG 24 hr tablet TAKE 1 TABLET BY MOUTH EVERY DAY 90 tablet 3  . Calcium Citrate 200 MG TABS Take 200 mg by mouth daily.    . carvedilol (COREG) 25 MG tablet TAKE 1 TABLET (25 MG TOTAL) BY MOUTH 2 (TWO) TIMES DAILY WITH A MEAL. 180 tablet 3  . Cholecalciferol  (VITAMIN D) 2000 units tablet Take 2,000 Units by mouth daily.    . Cyanocobalamin (VITAMIN B-12 PO) Take 2,500 mcg by mouth daily.     [name] dexlansoprazole (DEXILANT) 60 MG capsule Take 1 capsule (60 mg total) by mouth daily. 30 capsule 5  . diltiazem (CARDIZEM CD) 120 MG 24 hr capsule TAKE 1 CAPSULE BY MOUTH EVERY DAY (FOR FUTURE REFILLS MAKE A DOCTORS VISIT) (Patient taking differently: Take 120 mg by mouth daily. ) 90 capsule 3  . fenofibrate 160 MG tablet Please specify directions, refills and quantity 30 tablet 4  . fluticasone (FLONASE) 50 MCG/ACT nasal spray PLACE 2 SPRAYS INTO BOTH NOSTRILS DAILY AS NEEDED FOR ALLERGIES 48 g 1  . fluticasone furoate-vilanterol (BREO ELLIPTA) 200-25 MCG/INH AEPB Inhale 1 puff into the lungs daily.    . furosemide (LASIX) 40 MG tablet TAKE 1 TABLET BY MOUTH TWICE A DAY 180 tablet 3  . gabapentin (NEURONTIN) 100 MG capsule TAKE 1 CAPSULE BY MOUTH THREE TIMES A DAY 270 capsule 0  . glucose blood (BAYER CONTOUR TEST) test strip Use as instructed to check sugar 3 times daily 300 each 5  . Insulin Glargine, 1 Unit Dial, (TOUJEO SOLOSTAR) 300 UNIT/ML SOPN Inject 25 Units into the skin at bedtime. (Patient taking differently: Inject 40 Units into the skin at bedtime. ) 5 pen   . insulin lispro (HUMALOG KWIKPEN) 100 UNIT/ML KwikPen Take PRN, for a total of 25 units per day 5 pen 11  . Insulin Pen Needle 33G X 4 MM MISC 1 each by Does not apply route 4 (four) times daily. 200 each 11  . ipratropium (ATROVENT) 0.06 % nasal spray Place 2 sprays into both nostrils 4 (four) times daily. (Patient taking differently: Place 2 sprays into both nostrils 2 (two) times daily as needed for rhinitis. ) 15 mL 0  . KLOR-CON M20 20 MEQ tablet Take 30 mEq by mouth 2 (two) times daily.   2  . linaclotide (LINZESS) 290 MCG CAPS capsule Take 1 capsule (290 mcg total) by mouth daily before breakfast. (Patient taking differently: Take 290 mcg by mouth every other day. ) 30 capsule 11  .  LORazepam (ATIVAN) 0.5 MG tablet Take 0.5 mg by mouth every 12 (twelve) hours as needed for anxiety.    . metFORMIN (GLUCOPHAGE) 1000 MG tablet TAKE 1 TABLET BY MOUTH TWICE A DAY WITH MEALS 180 tablet 0  . naproxen (NAPROSYN) 500 MG tablet Take 1 tablet (500 mg total) by mouth 2 (two) times daily with a meal. 60 tablet 1  . nitroGLYCERIN (NITROSTAT) 0.4 MG SL tablet Place 1 tablet (0.4 mg total) under the tongue every 5 (five) minutes as needed for chest pain. Reported on 01/05/2016 25 tablet 6  . ondansetron (ZOFRAN-ODT) 4 MG disintegrating tablet Take 1 tablet (4 mg total) by mouth every 8 (eight) hours as  needed for nausea or vomiting. 20 tablet 1  . oxyCODONE-acetaminophen (PERCOCET) 5-325 MG tablet Take 1 tablet by mouth every 6 (six) hours as needed for severe pain. 30 tablet 0  . PARoxetine Mesylate (BRISDELLE) 7.5 MG CAPS Take 7.5 mg by mouth daily.     . predniSONE (DELTASONE) 5 MG tablet Take 5 mg by mouth daily with breakfast.    . sulfamethoxazole-trimethoprim (BACTRIM DS,SEPTRA DS) 800-160 MG tablet Take 1 tablet by mouth 2 (two) times daily. 14 tablet 0  . telmisartan (MICARDIS) 20 MG tablet TAKE 1 TABLET BY MOUTH EVERY DAY 90 tablet 2  . triamcinolone cream (KENALOG) 0.1 % Apply 1 application topically 2 (two) times daily as needed (for rash. stop after 7-10 days.). 80 g 0  . venlafaxine XR (EFFEXOR-XR) 75 MG 24 hr capsule Take 1 capsule (75 mg total) by mouth 2 (two) times daily.    [name] doxycycline (VIBRA-TABS) 100 MG tablet Take 1 tablet (100 mg total) by mouth 2 (two) times daily for 7 days. 14 tablet 0  . isosorbide mononitrate (IMDUR) 30 MG 24 hr tablet Take 1 tablet (30 mg total) by mouth at bedtime. 90 tablet 3  . predniSONE (DELTASONE) 50 MG tablet Take 1 tablet (50 mg total) by mouth daily with breakfast for 5 days. 5 tablet 0   No current facility-administered medications for this visit.     Objective: BP 110/60 (BP Location: Left Arm, Patient Position: Sitting, Cuff Size:  Large)   Pulse 83   Temp 97.8 F (36.6 C) (Oral)   Ht 5\' 6"  (1.676 m)   Wt 262 lb (118.8 kg)   SpO2 96%   BMI 42.29 kg/m  Gen: NAD, resting comfortably HEENT: Turbinates erythematous with green drainage, TM normal, pharynx mildly erythematous with no tonsilar exudate or edema, bilateral frontal and maxillary sinus tenderness reported CV: RRR no murmurs rubs or gallops Lungs: CTAB no crackles, wheeze, rhonchi Abdomen: soft/somewhat tender in epigastric area/nondistended/normal bowel sounds. No rebound or guarding.  Ext: no edema Skin: warm, dry, no rash  Assessment/Plan:  Sinus infection/Sinusitis Also with flare up of obstructive chronic bronchitis/sarcoidosis of lungs Bacterial based on double sickening, or severe symptoms in first 3 days  Treatment: -symptomatic care with  Sinus rinses like a Neti Pot or Neilmed sinus rinse (make sure to follow instructions for water preparation Tylenol 650 mg every 6 hours to help with sinus pressure -Antibiotic indicated: yes, does well with doxycycline- allergy of rash listed but she has done better lately - on 5 mg prednisone chronically- will give 5 day course of 50mg  to try to calm down flare up of lungs. Discussed mild poor control of diabetes on last check and being careful about blood sugar rising higher as a result of prednisone Lab Results  Component Value Date   HGBA1C 8.2 (A) 08/25/2018   She also reports epigastric abdominal pain after meals for 2 weeks and worsening heartburn despite Dexilant.  We will check some baseline labs and I encouraged follow up with Dr. [name] for heart burn portion- if he does not think primary GI issue- would need to see her back. If she has worsening pain and cannot get into see him- would want to see her back and would consider CT abdomen/pelvis to rule out other disease.   Flu swab negative in relation to recent flu exposure- she will need to see [place] back if has new or worsening symptoms despite  treatment  Finally, we reviewed reasons to return to care  including if symptoms worsen or persist  (despite above treatments) or new concerns arise (particularly fever or shortness of breath)  Meds ordered this encounter  Medications  . doxycycline (VIBRA-TABS) 100 MG tablet    Sig: Take 1 tablet (100 mg total) by mouth 2 (two) times daily for 7 days.    Dispense:  14 tablet    Refill:  0  . predniSONE (DELTASONE) 50 MG tablet    Sig: Take 1 tablet (50 mg total) by mouth daily with breakfast for 5 days.    Dispense:  5 tablet    Refill:  0     Garret Reddish, MD

## 2018-12-05 ENCOUNTER — Telehealth: Payer: Self-pay | Admitting: Family Medicine

## 2018-12-05 ENCOUNTER — Other Ambulatory Visit: Payer: Self-pay | Admitting: Endocrinology

## 2018-12-05 NOTE — Telephone Encounter (Signed)
Patient called and says she would like to know what her kidney function is. I advised BUN 23 and Creatinine 1.16, which is at the high end of normal, she verbalized understanding. She asked about the prednisone clarification, I advised it will run the blood sugar up, so Dr. Yong Channel says to watch her sugars carefully and may ultimately stop it.

## 2018-12-05 NOTE — Telephone Encounter (Signed)
Copied from [place] [id number]. Topic: Quick Communication - See Telephone Encounter >> Dec 05, 2018  4:29 PM Vernona Rieger wrote: CRM for notification. See Telephone encounter for: 12/05/18.  Patient would like to know what her kidney function is? She just received her results from the nurse. Please call back.

## 2018-12-06 ENCOUNTER — Encounter: Payer: Self-pay | Admitting: Family Medicine

## 2018-12-06 NOTE — Telephone Encounter (Signed)
"  I'm calling to see if my wife's surgery has been approved by her insurance."  Yes, Dr. Amalia Hailey said it had been approved.  His next available date is not going to be until April 30.  "Okay, schedule her for then."  Does she have a history of MRSA? "Yes, she does."  We may have to do the surgery at [facility].  I'll check with Dr. Amalia Hailey.  If it's done at [facility], I'll mail you the medical clearance forms.  "Does she have a regular appointment with Dr. Amalia Hailey scheduled?"  She does not, do you want me to transfer you to a scheduler so you can schedule her an appointment?"  No, I need to check with her first to see what's good for her.  We'll call back to schedule the appointment.  Go ahead and put her down for [facility] on April 30.  I know you can't schedule it now because it's too far out."

## 2018-12-07 DIAGNOSIS — R112 Nausea with vomiting, unspecified: Secondary | ICD-10-CM | POA: Diagnosis not present

## 2018-12-07 DIAGNOSIS — R197 Diarrhea, unspecified: Secondary | ICD-10-CM | POA: Diagnosis not present

## 2018-12-07 DIAGNOSIS — R109 Unspecified abdominal pain: Secondary | ICD-10-CM | POA: Diagnosis not present

## 2018-12-07 DIAGNOSIS — J019 Acute sinusitis, unspecified: Secondary | ICD-10-CM | POA: Diagnosis not present

## 2018-12-08 ENCOUNTER — Other Ambulatory Visit: Payer: Self-pay | Admitting: Family Medicine

## 2018-12-08 NOTE — Telephone Encounter (Signed)
"  I was calling you to see if you got any information back from the insurance company about my surgery.  We got a letter in the mail yesterday that said it was approved and we were wondering if you got one.  If you could call me back, I'd appreciate it."  I am returning your call.  We need you to schedule an appointment to see Dr. Amalia Hailey for another consult.  You will sign consent forms for your surgery that we are trying to get scheduled for February 08, 2019. We had you scheduled previously at [facility] previously but they may cancel at the last minute because you have a history of MRSA.  Do you want to just schedule the surgery at [facility]?  "I'd rather not take a chance, go ahead and schedule me at [facility]  Since you're going to [name], you will need to get a history and physical form completed by your primary care physician within a 30 window of the surgery date.  Would you like me to transfer you to a scheduler so you can schedule an appointment with Dr. Amalia Hailey.  "Yes, that will be fine."  I transferred her to Ria Comment so she can schedule [name] an appointment for a consultation with Dr. Amalia Hailey.  I mailed her the history and physical form as well.

## 2018-12-10 ENCOUNTER — Other Ambulatory Visit: Payer: Self-pay | Admitting: Endocrinology

## 2018-12-10 NOTE — Telephone Encounter (Signed)
Please forward refill request to pt's primary care provider.   

## 2018-12-11 ENCOUNTER — Other Ambulatory Visit: Payer: Self-pay

## 2018-12-11 ENCOUNTER — Telehealth: Payer: Self-pay | Admitting: Family Medicine

## 2018-12-11 DIAGNOSIS — Z794 Long term (current) use of insulin: Secondary | ICD-10-CM

## 2018-12-11 DIAGNOSIS — E1129 Type 2 diabetes mellitus with other diabetic kidney complication: Secondary | ICD-10-CM

## 2018-12-11 MED ORDER — BASAGLAR KWIKPEN 100 UNIT/ML ~~LOC~~ SOPN: 40.0000 [IU] | PEN_INJECTOR | Freq: Every day | SUBCUTANEOUS | 2 refills | Status: DC

## 2018-12-11 NOTE — Telephone Encounter (Signed)
Okay for refill? Rx filled by Dr. Dwyane Dee  Please advise

## 2018-12-11 NOTE — Telephone Encounter (Signed)
This should go to her endocrinologist-looks like Dr. Loanne Drilling.  It appears endocrinology has always prescribed this.

## 2018-12-12 ENCOUNTER — Ambulatory Visit (INDEPENDENT_AMBULATORY_CARE_PROVIDER_SITE_OTHER): Payer: Medicare Other | Admitting: Orthopedic Surgery

## 2018-12-12 NOTE — Telephone Encounter (Signed)
Noted  

## 2018-12-12 NOTE — Telephone Encounter (Signed)
See note

## 2018-12-12 NOTE — Telephone Encounter (Signed)
Pt's spouse calling.  States that Dr. Cordelia Pen office has told them that PCP must start prescribing this medication.  Pt's spouse wants to know if Dr. Yong Channel can do this for them.

## 2018-12-13 ENCOUNTER — Ambulatory Visit: Payer: BLUE CROSS/BLUE SHIELD | Admitting: Endocrinology

## 2018-12-13 ENCOUNTER — Other Ambulatory Visit (INDEPENDENT_AMBULATORY_CARE_PROVIDER_SITE_OTHER): Payer: 59

## 2018-12-13 ENCOUNTER — Ambulatory Visit (INDEPENDENT_AMBULATORY_CARE_PROVIDER_SITE_OTHER): Payer: 59 | Admitting: Nurse Practitioner

## 2018-12-13 ENCOUNTER — Encounter: Payer: Self-pay | Admitting: Nurse Practitioner

## 2018-12-13 VITALS — BP 130/56 | HR 78 | Ht 66.0 in | Wt 264.2 lb

## 2018-12-13 DIAGNOSIS — R1013 Epigastric pain: Secondary | ICD-10-CM | POA: Diagnosis not present

## 2018-12-13 DIAGNOSIS — R110 Nausea: Secondary | ICD-10-CM

## 2018-12-13 DIAGNOSIS — K219 Gastro-esophageal reflux disease without esophagitis: Secondary | ICD-10-CM | POA: Diagnosis not present

## 2018-12-13 LAB — CBC
HCT: 32.6 % — ABNORMAL LOW (ref 36.0–46.0)
Hemoglobin: 10.5 g/dL — ABNORMAL LOW (ref 12.0–15.0)
MCHC: 32.2 g/dL (ref 30.0–36.0)
MCV: 82.5 fl (ref 78.0–100.0)
Platelets: 422 10*3/uL — ABNORMAL HIGH (ref 150.0–400.0)
RBC: 3.95 Mil/uL (ref 3.87–5.11)
RDW: 14.9 % (ref 11.5–15.5)
WBC: 17.5 10*3/uL — ABNORMAL HIGH (ref 4.0–10.5)

## 2018-12-13 LAB — LIPASE: Lipase: 11 U/L (ref 11.0–59.0)

## 2018-12-13 MED ORDER — FAMOTIDINE 20 MG PO TABS: 20.0000 mg | ORAL_TABLET | Freq: Every day | ORAL | 3 refills | Status: DC

## 2018-12-13 MED ORDER — ONDANSETRON 4 MG PO TBDP: 4.0000 mg | ORAL_TABLET | Freq: Three times a day (TID) | ORAL | 2 refills | Status: DC | PRN

## 2018-12-13 NOTE — Telephone Encounter (Signed)
Message routed to PCP for refill if appropiate.

## 2018-12-13 NOTE — Telephone Encounter (Signed)
Pt notified of update. Pt verbalized understating.

## 2018-12-13 NOTE — Patient Instructions (Signed)
If you are age 60 or older, your body mass index should be between 23-30. Your Body mass index is 42.65 kg/m. If this is out of the aforementioned range listed, please consider follow up with your Primary Care Provider.  If you are age 73 or younger, your body mass index should be between 19-25. Your Body mass index is 42.65 kg/m. If this is out of the aformentioned range listed, please consider follow up with your Primary Care Provider.   Your provider has requested that you go to the basement level for lab work before leaving today. Press "B" on the elevator. The lab is located at the first door on the left as you exit the elevator. CBC Lipase  We have sent the following medications to your pharmacy for you to pick up at your convenience: [facility]  Call in a couple of weeks with an update.  Thank you for choosing me and [facility].   Tye Savoy, NP

## 2018-12-13 NOTE — Telephone Encounter (Signed)
Here is what I know.  Pt previously saw another dr in this office, who prescribed this.  When she started seeing me, I declined to assume care for this condition.  Due to a clerical error in the office here, the medication was repeatedly refilled under my name, and  under the "refill protocol."  Neither Dr Yong Channel nor I are under any obligation to refill this. If she wants a refill, she needs to see a provider to have this considered.  Thanks for addressing this.

## 2018-12-13 NOTE — Telephone Encounter (Signed)
We have never prescribed this so I am not sure why/if Dr. Loanne Drilling or his staff would say this. Might be a simple misunderstanding. Will forward to Dr. Loanne Drilling to confirm. Endocrine has prescribed this since 2015. Primary care has never prescribed that I can tell.   If we don't hear back by the end of the week- you can refill for her

## 2018-12-13 NOTE — Telephone Encounter (Signed)
Here is what I know about this: pt previously saw a different dr in this practice, who prescribed this.  I did not wish to assume care of this condition--just the

## 2018-12-13 NOTE — Telephone Encounter (Signed)
Dr. Loanne Drilling- that is super helpful- thanks for the update!  Team- you can send this in under my name. I just wanted to not step on Dr. Cordelia Pen toes on prescribing this.

## 2018-12-13 NOTE — Progress Notes (Signed)
Assessment and plan reviewed 

## 2018-12-13 NOTE — Progress Notes (Signed)
Chief Complaint:   Upper abdominal pain, GERD, nausea  IMPRESSION and PLAN:    24.  58 year old female with 3-week history of epigastric pain and nausea.  This in the setting of several weeks of naproxen. She also takes Prednisone which was peripherally increased for sinusitis.  Rule out PUD. -Patient has been off the naproxen for just over a week now.  Continue Dexilant, I am adding Pepcid at bedtime for above symptoms as well as persistent GERD symptoms.  -Patient will notify us if she has any black stools at home -We will check CBC as well as lipase today.  Her liver tests on 12/04/18 were normal so no need to repeat. -We will refill Zofran -Her blood sugars are high which can certainly cause delayed gastric emptying.  She is trying to manage those in the setting of prednisone.  -Patient will call in a couple weeks with a condition update.  Further recommendation pending clinical course  2.  GERD, status post fundoplication.  Still having frequent heartburn on daily Dexilant.  -Reflux measures discussed, she already follows a lot of them -Adding Pepcid at bedtime   HPI:     Patient is a 58 year old female with multiple medical problems known to Dr. [name].  Her GI history is pertinent for esophageal dysmotility, chronic constipation,and GERD status post fundoplication.  Patient is here for evaluation of nausea and upper abdominal pain which started approximately 3 weeks ago.  She saw PCP 12/04/2018, treated with doxycycline for sinusitis.  Her prednisone was increased at that time for 5 days.  She is on low-dose of prednisone chronically for sarcoidosis.  PCP mentioned at that time that patient was having some uncontrolled GERD symptoms as well as upper abdominal discomfort.  Checked labs which were basically at baseline.  Her blood sugar was 300.   Patient developed nausea following visit with PCP.  She went to urgent care last Thursday.  Doxycycline felt to be possible cause of  nausea, she was changed to Bactrim DS x5 days.  She says that labs were done and white count apparently elevated though that was in the setting of high-dose prednisone.  She is on multiple medications but has not had any other recent changes to her regimen.  Rarely takes narcotics.  Baillie comes in with persistent nausea, Zofran does help.  Unable to vomit due to history of fundoplication she describes the nausea as being " bad" .  She has non-radiating stabbing epigastric pain.  No black stools.  She was taking naproxen several weeks ago for toe pain.  She has been off the naproxen for several days now. She also takes a daily baby asa.  Mayline continues to have frequent heartburn despite Dexilant.  For the most part she goes to bed on an empty stomach, she has something similar to a wedge pillow.  She consumes very little caffeine.  Data review:  Labs 12/04/2018.   Comprehensive metabolic profile basically unremarkable except for glucose of 302, GFR 47 WBC 9.1, hemoglobin 9.7, MCV 82, platelets 293  Review of systems:     No chest pain, no SOB, no fevers, no urinary sx   Past Medical History:  Diagnosis Date  . Abnormal SPEP 04/17/2014  . Acute left ankle pain 01/26/2017  . ANEMIA-UNSPECIFIED 09/18/2009  . Anxiety   . Arthritis   . CHF (congestive heart failure) ([place])   . Chronic diastolic heart failure, NYHA class 2 (HCC)    Normal LVEDP by May  2018  . COPD (chronic obstructive pulmonary disease) ([place])   . Depression   . DIABETES MELLITUS, TYPE II 08/21/2006  . Diabetic osteomyelitis ([place]) 05/29/2015  . Fracture of 5th metatarsal    non union  . GERD 04/03/7627   Had fundoplication  . GOUT 08/20/2010  . Hx of umbilical hernia repair   . HYPERLIPIDEMIA 08/21/2006  . HYPERTENSION 08/21/2006  . Infection of wound due to methicillin resistant Staphylococcus aureus (MRSA)   . Internal hemorrhoids   . Multiple allergies 10/14/2016  . OBESITY 06/04/2009  . Onychomycosis 10/27/2015  .  Osteomyelitis of left foot ([place]) 05/29/2015  . Pulmonary sarcoidosis ([place])    Followed locally by pulmonology, but also by Dr. Casper Harrison at [facility]  . Right knee pain 01/26/2017  . Vocal cord dysfunction   . Wears partial dentures     Patient's surgical history, family medical history, social history, medications and allergies were all reviewed in Epic   Serum creatinine: 1.16 mg/dL 12/04/18 1330 Estimated creatinine clearance: 68.8 mL/min  Current Outpatient Medications  Medication Sig Dispense Refill  . albuterol (PROVENTIL HFA;VENTOLIN HFA) 108 (90 Base) MCG/ACT inhaler Inhale 1-2 puffs into the lungs every 6 (six) hours as needed for wheezing or shortness of breath. 8 g 5  . allopurinol (ZYLOPRIM) 100 MG tablet Take 100 mg by mouth 2 (two) times daily.     [name] aspirin EC 81 MG tablet Take 81 mg by mouth daily.     [name] atorvastatin (LIPITOR) 40 MG tablet TAKE 1 TAB BY MOUTH ONCE DAILY 90 tablet 3  . BAYER MICROLET LANCETS lancets Use as instructed to check sugar 3 times daily 300 each 5  . buPROPion (WELLBUTRIN XL) 300 MG 24 hr tablet TAKE 1 TABLET BY MOUTH EVERY DAY 90 tablet 3  . Calcium Citrate 200 MG TABS Take 200 mg by mouth daily.    . carvedilol (COREG) 25 MG tablet TAKE 1 TABLET (25 MG TOTAL) BY MOUTH 2 (TWO) TIMES DAILY WITH A MEAL. 180 tablet 3  . Cholecalciferol (VITAMIN D) 2000 units tablet Take 2,000 Units by mouth daily.    . Cyanocobalamin (VITAMIN B-12 PO) Take 2,500 mcg by mouth daily.     [name] dexlansoprazole (DEXILANT) 60 MG capsule Take 1 capsule (60 mg total) by mouth daily. 30 capsule 5  . diltiazem (CARDIZEM CD) 120 MG 24 hr capsule TAKE 1 CAPSULE BY MOUTH EVERY DAY (FOR FUTURE REFILLS MAKE A DOCTORS VISIT) (Patient taking differently: Take 120 mg by mouth daily. ) 90 capsule 3  . fenofibrate 160 MG tablet Please specify directions, refills and quantity 30 tablet 4  . fluticasone (FLONASE) 50 MCG/ACT nasal spray PLACE 2 SPRAYS INTO BOTH NOSTRILS DAILY AS  NEEDED FOR ALLERGIES 48 g 1  . fluticasone furoate-vilanterol (BREO ELLIPTA) 200-25 MCG/INH AEPB Inhale 1 puff into the lungs daily.    . furosemide (LASIX) 40 MG tablet TAKE 1 TABLET BY MOUTH TWICE A DAY 180 tablet 3  . gabapentin (NEURONTIN) 100 MG capsule TAKE 1 CAPSULE BY MOUTH THREE TIMES A DAY 270 capsule 0  . glucose blood (BAYER CONTOUR TEST) test strip Use as instructed to check sugar 3 times daily 300 each 5  . Insulin Glargine (BASAGLAR KWIKPEN) 100 UNIT/ML SOPN Inject 0.4 mLs (40 Units total) into the skin at bedtime. 4 pen 2  . insulin lispro (HUMALOG KWIKPEN) 100 UNIT/ML KwikPen Take PRN, for a total of 25 units per day 5 pen 11  . Insulin Pen Needle 33G  X 4 MM MISC 1 each by Does not apply route 4 (four) times daily. 200 each 11  . ipratropium (ATROVENT) 0.06 % nasal spray Place 2 sprays into both nostrils 4 (four) times daily. (Patient taking differently: Place 2 sprays into both nostrils 2 (two) times daily as needed for rhinitis. ) 15 mL 0  . KLOR-CON M20 20 MEQ tablet Take 30 mEq by mouth 2 (two) times daily.   2  . linaclotide (LINZESS) 290 MCG CAPS capsule Take 1 capsule (290 mcg total) by mouth daily before breakfast. (Patient taking differently: Take 290 mcg by mouth every other day. ) 30 capsule 11  . LORazepam (ATIVAN) 0.5 MG tablet Take 0.5 mg by mouth every 12 (twelve) hours as needed for anxiety.    . metFORMIN (GLUCOPHAGE) 1000 MG tablet TAKE 1 TABLET BY MOUTH TWICE A DAY WITH MEALS 180 tablet 0  . naproxen (NAPROSYN) 500 MG tablet Take 1 tablet (500 mg total) by mouth 2 (two) times daily with a meal. 60 tablet 1  . nitroGLYCERIN (NITROSTAT) 0.4 MG SL tablet Place 1 tablet (0.4 mg total) under the tongue every 5 (five) minutes as needed for chest pain. Reported on 01/05/2016 25 tablet 6  . ondansetron (ZOFRAN-ODT) 4 MG disintegrating tablet Take 1 tablet (4 mg total) by mouth every 8 (eight) hours as needed for nausea or vomiting. 20 tablet 1  . oxyCODONE-acetaminophen  (PERCOCET) 5-325 MG tablet Take 1 tablet by mouth every 6 (six) hours as needed for severe pain. 30 tablet 0  . PARoxetine Mesylate (BRISDELLE) 7.5 MG CAPS Take 7.5 mg by mouth daily.     . predniSONE (DELTASONE) 5 MG tablet Take 5 mg by mouth daily with breakfast.    . sulfamethoxazole-trimethoprim (BACTRIM DS,SEPTRA DS) 800-160 MG tablet Take 1 tablet by mouth 2 (two) times daily. 14 tablet 0  . telmisartan (MICARDIS) 20 MG tablet TAKE 1 TABLET BY MOUTH EVERY DAY 90 tablet 1  . triamcinolone cream (KENALOG) 0.1 % Apply 1 application topically 2 (two) times daily as needed (for rash. stop after 7-10 days.). 80 g 0  . venlafaxine XR (EFFEXOR-XR) 75 MG 24 hr capsule TAKE 1 CAPSULE (75 MG TOTAL) BY MOUTH 2 (TWO) TIMES DAILY. 180 capsule 3  . isosorbide mononitrate (IMDUR) 30 MG 24 hr tablet Take 1 tablet (30 mg total) by mouth at bedtime. 90 tablet 3   No current facility-administered medications for this visit.     Physical Exam:     BP (!) 130/56   Pulse 78   Ht 5\' 6"  (1.676 m)   Wt 264 lb 4 oz (119.9 kg)   BMI 42.65 kg/m   GENERAL:  Pleasant female in NAD PSYCH: : Cooperative, normal affect EENT:  conjunctiva pink, mucous membranes moist, neck supple without masses CARDIAC:  RRR, no murmur heard, no peripheral edema PULM: Normal respiratory effort, lungs CTA bilaterally, no wheezing ABDOMEN:   soft, protuberant. No obvious masses, no hepatomegaly,  normal bowel sounds SKIN:  turgor, no lesions seen Musculoskeletal:  Normal muscle tone, normal strength NEURO: Alert and oriented x 3, no focal neurologic deficits   Tye Savoy , NP 12/13/2018, 10:59 AM

## 2018-12-14 ENCOUNTER — Ambulatory Visit (INDEPENDENT_AMBULATORY_CARE_PROVIDER_SITE_OTHER): Payer: Medicare Other | Admitting: Orthopedic Surgery

## 2018-12-14 ENCOUNTER — Ambulatory Visit (INDEPENDENT_AMBULATORY_CARE_PROVIDER_SITE_OTHER): Payer: 59 | Admitting: Orthopedic Surgery

## 2018-12-14 ENCOUNTER — Encounter (INDEPENDENT_AMBULATORY_CARE_PROVIDER_SITE_OTHER): Payer: Self-pay | Admitting: Orthopedic Surgery

## 2018-12-14 ENCOUNTER — Other Ambulatory Visit: Payer: Self-pay

## 2018-12-14 VITALS — Ht 66.0 in | Wt 264.2 lb

## 2018-12-14 DIAGNOSIS — K760 Fatty (change of) liver, not elsewhere classified: Secondary | ICD-10-CM | POA: Diagnosis not present

## 2018-12-14 DIAGNOSIS — R109 Unspecified abdominal pain: Secondary | ICD-10-CM | POA: Diagnosis not present

## 2018-12-14 DIAGNOSIS — Z9889 Other specified postprocedural states: Secondary | ICD-10-CM | POA: Diagnosis not present

## 2018-12-14 DIAGNOSIS — M7542 Impingement syndrome of left shoulder: Secondary | ICD-10-CM

## 2018-12-14 DIAGNOSIS — D72829 Elevated white blood cell count, unspecified: Secondary | ICD-10-CM | POA: Diagnosis not present

## 2018-12-14 DIAGNOSIS — K5900 Constipation, unspecified: Secondary | ICD-10-CM | POA: Diagnosis not present

## 2018-12-14 MED ORDER — VENLAFAXINE HCL ER 75 MG PO CP24: 75.0000 mg | ORAL_CAPSULE | Freq: Two times a day (BID) | ORAL | 3 refills | Status: DC

## 2018-12-14 MED ORDER — GABAPENTIN 100 MG PO CAPS: ORAL_CAPSULE | ORAL | 3 refills | Status: DC

## 2018-12-14 NOTE — Telephone Encounter (Signed)
Rx sent to pharmacy   

## 2018-12-18 ENCOUNTER — Encounter (INDEPENDENT_AMBULATORY_CARE_PROVIDER_SITE_OTHER): Payer: Self-pay | Admitting: Orthopedic Surgery

## 2018-12-18 NOTE — Progress Notes (Signed)
Office Visit Note   Patient: [name]           Date of Birth: [birth year]           MRN: [id number] Visit Date: 12/14/2018              Requested by: [name], MD [place], [place] [address] PCP: [name], MD  Chief Complaint  Patient presents with  . Left Shoulder - Follow-up      HPI: Patient is a 58 year old woman who presents in follow-up for her left shoulder impingement symptoms.  Patient states that the subacromial injection did not provide her any relief.  She states she cannot reach her arm above her shoulder without pain she states the pain is a 5-6 out of 10 today.  She states she is taken Tylenol and anti-inflammatories without relief.  She states she is not interested in another injection.  Assessment & Plan: Visit Diagnoses:  1. Impingement syndrome of left shoulder     Plan: Discussed with patient the next option would be to proceed with arthroscopic debridement of the left shoulder.  Would set this up as an outpatient at [facility] due to history of sleep apnea.  Risk and benefits were discussed including persistent pain.  Follow-Up Instructions: Return in about 2 weeks (around 12/28/2018).   Ortho Exam  Patient is alert, oriented, no adenopathy, well-dressed, normal affect, normal respiratory effort. Examination patient has abduction of flexion to 70 degrees she has internal and external rotation of 45 degrees the biceps tendon is tender to palpation she has pain with Neer and Hawkins impingement test.  Imaging: No results found. No images are attached to the encounter.  Labs: Lab Results  Component Value Date   HGBA1C 8.2 (A) 08/25/2018   HGBA1C 7.6 (A) 04/11/2018   HGBA1C 8.4 (H) 11/29/2017   ESRSEDRATE 28 06/16/2017   ESRSEDRATE 5 01/26/2017   ESRSEDRATE 27 10/14/2016   CRP 27.1 (H) 06/16/2017   CRP 17.3 (H) 01/26/2017   CRP 24.0 (H) 10/14/2016   LABURIC 5.1 01/26/2017   REPTSTATUS 09/22/2017 FINAL  09/20/2017   GRAMSTAIN  08/14/2017    MODERATE WBC PRESENT, PREDOMINANTLY PMN MODERATE BUDDING YEAST SEEN    CULT NO GROUP A STREP (S.PYOGENES) ISOLATED 09/20/2017   LABORGA PSEUDOMONAS FLUORESCENS 08/14/2017     Lab Results  Component Value Date   ALBUMIN 4.1 12/04/2018   ALBUMIN 4.4 04/05/2018   ALBUMIN 4.2 02/08/2018   LABURIC 5.1 01/26/2017    Body mass index is 42.65 kg/m.  Orders:  No orders of the defined types were placed in this encounter.  No orders of the defined types were placed in this encounter.    Procedures: No procedures performed  Clinical Data: No additional findings.  ROS:  All other systems negative, except as noted in the HPI. Review of Systems  Objective: Vital Signs: Ht 5\' 6"  (1.676 m)   Wt 264 lb 4 oz (119.9 kg)   BMI 42.65 kg/m   Specialty Comments:  No specialty comments available.  PMFS History: Patient Active Problem List   Diagnosis Date Noted  . Subcortical microvascular ischemic occlusive disease 07/12/2018  . Rapid palpitations 07/12/2018  . Impingement of left ankle joint 06/26/2018  . Constipation 04/21/2018  . Anxiety 04/05/2018  . Total knee replacement status, left 12/07/2017  . Dysphagia 09/20/2017  . Epiglottitis   . Plantar fasciitis, right 07/13/2017  . S/P total knee arthroplasty, right 06/29/2017  .  Osteoarthrosis, localized, primary, knee, right   . Sprain of calcaneofibular ligament of right ankle 06/06/2017  . Osteoarthritis of right knee 01/26/2017  . Onychomycosis 10/27/2015  . CKD (chronic kidney disease) stage 3, GFR 30-59 ml/min (HCC) 04/27/2015  . MRSA (methicillin resistant staph aureus) culture positive 03/27/2015  . History of osteomyelitis 03/27/2015  . Fatty liver 09/30/2014  . Hot flashes 07/15/2014  . Abnormal SPEP 04/17/2014  . Fracture of left leg 04/17/2014  . Cushingoid side effect of steroids ([place]) 04/17/2014  . Internal hemorrhoids   . Depression   . Preoperative clearance  03/25/2014  . Obstructive chronic bronchitis without exacerbation ([place]) 09/18/2013  . Chest pain 04/11/2013  . Hypertensive heart disease with chronic diastolic congestive heart failure ([place])   . Solitary pulmonary nodule, on CT 02/2013 - stable over 2 years in 2015 02/20/2013  . Gout 08/20/2010  . Anemia 09/18/2009  . Morbid obesity ([place]) 06/04/2009  . Sleep apnea 04/21/2009  . Sarcoidosis of lung ([place]) 04/10/2007  . Hyperlipidemia 08/21/2006  . Essential hypertension 08/21/2006  . GERD 08/21/2006  . Type II diabetes mellitus with renal manifestations ([place]) 08/21/2006   Past Medical History:  Diagnosis Date  . Abnormal SPEP 04/17/2014  . Acute left ankle pain 01/26/2017  . ANEMIA-UNSPECIFIED 09/18/2009  . Anxiety   . Arthritis   . CHF (congestive heart failure) ([place])   . Chronic diastolic heart failure, NYHA class 2 (HCC)    Normal LVEDP by May 2018  . COPD (chronic obstructive pulmonary disease) ([place])   . Depression   . DIABETES MELLITUS, TYPE II 08/21/2006  . Diabetic osteomyelitis ([place]) 05/29/2015  . Fracture of 5th metatarsal    non union  . GERD 74/09/8785   Had fundoplication  . GOUT 08/20/2010  . Hx of umbilical hernia repair   . HYPERLIPIDEMIA 08/21/2006  . HYPERTENSION 08/21/2006  . Infection of wound due to methicillin resistant Staphylococcus aureus (MRSA)   . Internal hemorrhoids   . Multiple allergies 10/14/2016  . OBESITY 06/04/2009  . Onychomycosis 10/27/2015  . Osteomyelitis of left foot ([place]) 05/29/2015  . Pulmonary sarcoidosis ([place])    Followed locally by pulmonology, but also by Dr. Casper Harrison at [facility]  . Right knee pain 01/26/2017  . Vocal cord dysfunction   . Wears partial dentures     Family History  Problem Relation Age of Onset  . Diabetes Father   . Heart attack Father 4  . Coronary artery disease Father   . Heart failure Father   . Throat cancer Father   . COPD Mother   . Emphysema Mother   . Asthma Mother   . Heart failure  Mother   . Breast cancer Mother   . Heart attack Maternal Grandfather   . Sarcoidosis Maternal Uncle   . Lung cancer Brother   . Diabetes Brother   . Colon cancer Neg Hx   . Rectal cancer Neg Hx     Past Surgical History:  Procedure Laterality Date  . ABDOMINAL HYSTERECTOMY    . APPENDECTOMY    . BLADDER SUSPENSION  11/11/2011   Procedure: TRANSVAGINAL TAPE (TVT) PROCEDURE;  Surgeon: Olga Millers, MD;  Location: [facility];  Service: Gynecology;  Laterality: N/A;  . CAROTIDS  02/18/11   CAROTID DUPLEX; VERTEBRALS ARE PATENT WITH ANTEGRADE FLOW. ICA/CCA RATIO 1.61 ON RIGHT AND 0.75 ON LEFT  . CHOLECYSTECTOMY  1984  . CYSTOSCOPY  11/11/2011   Procedure: CYSTOSCOPY;  Surgeon: Olga Millers, MD;  Location: [facility];  Service: Gynecology;  Laterality: N/A;  . EXTUBATION (ENDOTRACHEAL) IN OR N/A 09/23/2017   Procedure: EXTUBATION (ENDOTRACHEAL) IN OR;  Surgeon: Helayne Seminole, MD;  Location: [place];  Service: ENT;  Laterality: N/A;  . FIBEROPTIC LARYNGOSCOPY AND TRACHEOSCOPY N/A 09/23/2017   Procedure: FLEXIBLE FIBEROPTIC LARYNGOSCOPY;  Surgeon: Helayne Seminole, MD;  Location: [facility];  Service: ENT;  Laterality: N/A;  . FRACTURE SURGERY     foot  . HALLUX FUSION Left 10/02/2018   Procedure: HALLUX ARTHRODESIS;  Surgeon: Edrick Kins, DPM;  Location: [facility];  Service: Podiatry;  Laterality: Left;  . HAMMER TOE SURGERY Left 10/02/2018   Procedure: HAMMER TOE CORRECTION 2ND 3RD 4RD FIFTH TOE;  Surgeon: Edrick Kins, DPM;  Location: [facility];  Service: Podiatry;  Laterality: Left;  . HERNIA REPAIR    . I&D EXTREMITY Left 06/27/2015   Procedure: Partial Excision Left Calcaneus, Place Antibiotic Beads, and Wound VAC;  Surgeon: Newt Minion, MD;  Location: [facility];  Service: Orthopedics;  Laterality: Left;  . KNEE ARTHROSCOPY     right  . LEFT AND RIGHT HEART CATHETERIZATION WITH CORONARY ANGIOGRAM N/A 04/23/2013   Procedure: LEFT AND RIGHT HEART CATHETERIZATION WITH CORONARY ANGIOGRAM;   Surgeon: Leonie Man, MD;  Location: [facility];  Service: Cardiovascular;  Laterality: N/A;  . LEFT HEART CATH AND CORONARY ANGIOGRAPHY N/A 03/11/2017   Procedure: Left Heart Cath and Coronary Angiography;  Surgeon: Sherren Mocha, MD;  Location: [facility]; angiographically minimal CAD in the LAD otherwise normal.;  Normal LVEDP.  FALSE POSITIVE MYOVIEW  . LEFT HEART CATH AND CORONARY ANGIOGRAPHY  07/20/2010   LVEF 50-55% WITH VERY MILD GLOBAL HYPOKINESIA; ESSENTIALLY NORMAL CORONARY ARTERIES; NORMAL LV FUNCTION  . METATARSAL OSTEOTOMY WITH OPEN REDUCTION INTERNAL FIXATION (ORIF) METATARSAL WITH FUSION Left 04/09/2014   Procedure: LEFT FOOT FRACTURE OPEN TREATMENT METATARSAL INCLUDES INTERNAL FIXATION EACH;  Surgeon: Lorn Junes, MD;  Location: [facility];  Service: Orthopedics;  Laterality: Left;  . NISSEN FUNDOPLICATION  5329  . NM MYOVIEW LTD  03/09/2013   Lexiscan --> EF 50%; NORMAL MYOCARDIAL PERFUSION STUDY - breast attenuation  . NM MYOVIEW LTD  03/10/2017   : Moderate size "stress-induced "perfusion defect at the apex as well as "ill-defined stress-induced perfusion defect in the lateral wall.  EF 55%.  INTERMEDIATE risk. -->  FALSE POSITIVE  . Right and left CARDIAC CATHETERIZATION  04/23/2013   Angiographic normal coronaries; LVEDP 20 mmHg, PCWP 12-14 mmHg, RAP 12 mmHg.; [place]ick CO/CI 4.9/2.2  . TOTAL KNEE ARTHROPLASTY Right 06/29/2017   Procedure: RIGHT TOTAL KNEE ARTHROPLASTY;  Surgeon: Newt Minion, MD;  Location: [facility];  Service: Orthopedics;  Laterality: Right;  . TOTAL KNEE ARTHROPLASTY Left 12/07/2017  . TOTAL KNEE ARTHROPLASTY Left 12/07/2017   Procedure: LEFT TOTAL KNEE ARTHROPLASTY;  Surgeon: Newt Minion, MD;  Location: [facility];  Service: Orthopedics;  Laterality: Left;  . TRACHEOSTOMY TUBE PLACEMENT N/A 09/20/2017   Procedure: AWAKE INTUBATION WITH ANESTHESIA WITH VIDEO ASSISTANCE;  Surgeon: Helayne Seminole, MD;  Location: [facility];  Service:  ENT;  Laterality: N/A;  . TRANSTHORACIC ECHOCARDIOGRAM  08/2014   Normal LV size and function.  Mild LVH.  EF 55-60%.  Normal regional wall motion.  GR 1 DD.  Normal RV size and function .  [name] TUBAL LIGATION     with reversal in 1994  . VENTRAL HERNIA REPAIR     Social History   Occupational History  .  Occupation: DISABLED  Tobacco Use  . Smoking status: Never Smoker  . Smokeless tobacco: Never Used  Substance and Sexual Activity  . Alcohol use: No    Alcohol/week: 0.0 standard drinks  . Drug use: No  . Sexual activity: Yes    Birth control/protection: Surgical

## 2018-12-20 ENCOUNTER — Other Ambulatory Visit: Payer: Self-pay | Admitting: Endocrinology

## 2018-12-20 ENCOUNTER — Encounter: Payer: Self-pay | Admitting: Podiatry

## 2018-12-20 IMAGING — CT CT ABD-PELV W/ CM
2 of 5 series · 16 of 46 positions shown, 18 images · IV contrast (ISOVUE 300)
Comparison: CT abdomen pelvis of 08/23/2014

ADDENDUM:
There is calcification at the origins of the celiac axis, SMA, renal
arteries and IMA. However each of these vessels appears patent with
no significant stenosis or filling defect.
CLINICAL DATA: Mid abdominal pain for 3 weeks, heartburn, history
of fundoplication in 8444

EXAM:
CT ABDOMEN AND PELVIS WITH CONTRAST
TECHNIQUE: Multidetector CT imaging of the abdomen and pelvis was performed
using the standard protocol following bolus administration of
intravenous contrast.
CONTRAST:  100mL FRNYWV-311 IOPAMIDOL (FRNYWV-311) INJECTION 61%

[Series 2: abd/pel w · axial · 0.80mm/px · z∈[-491,-56]mm · 13 of 99 slices shown, 15 images]
[im 6/99  soft-tissue]
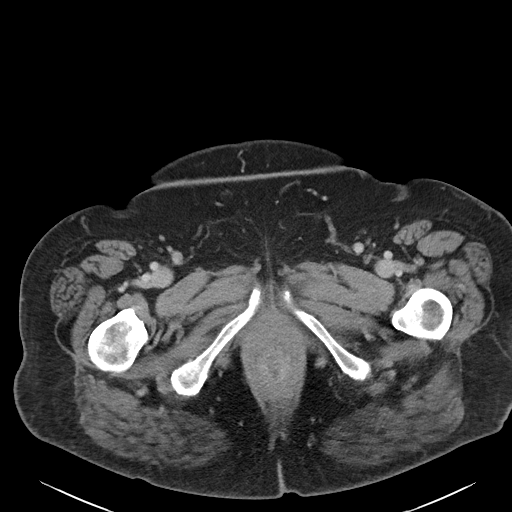
[im 6/99  bone]
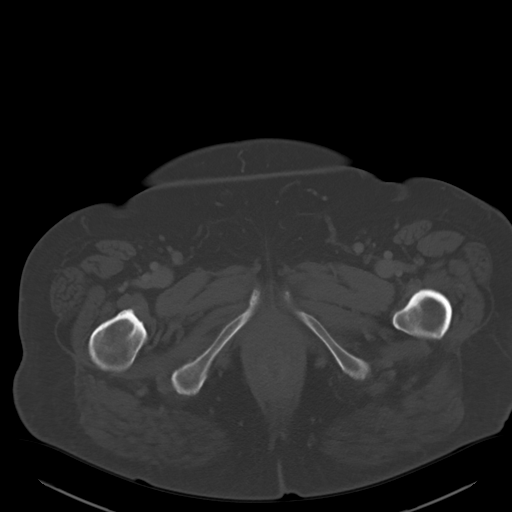
[im 16/99  soft-tissue]
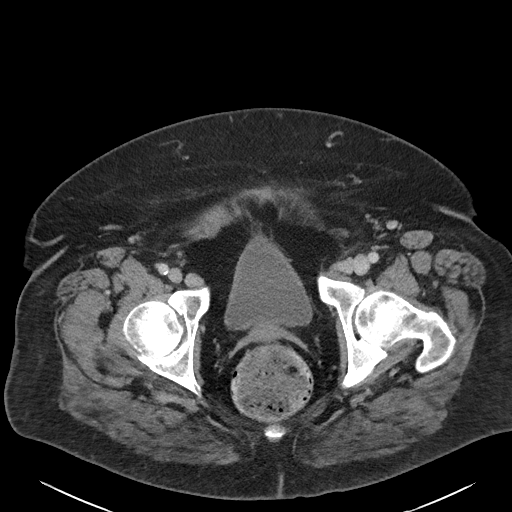
[im 21/99  soft-tissue]
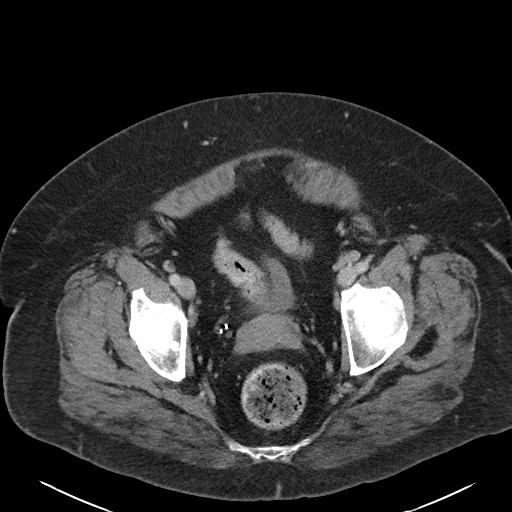
[im 26/99  soft-tissue]
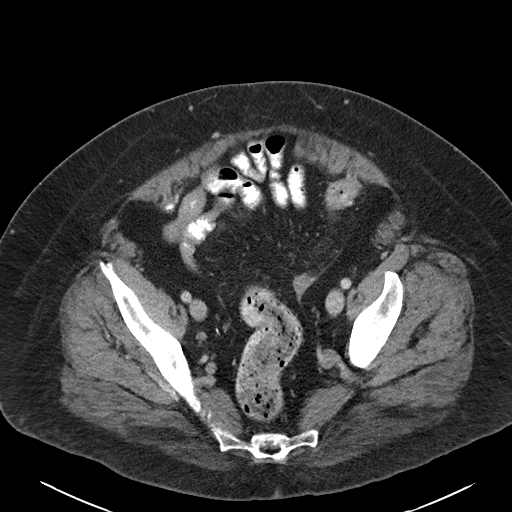
[im 37/99  soft-tissue]
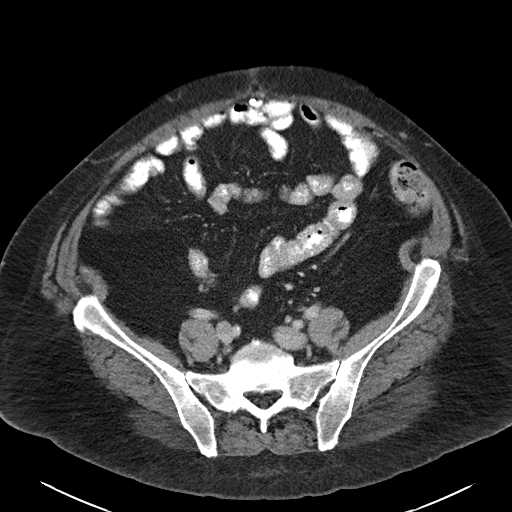
[im 42/99  soft-tissue]
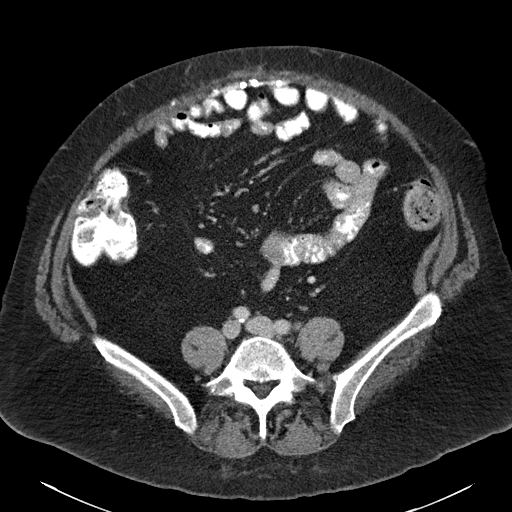
[im 52/99  soft-tissue]
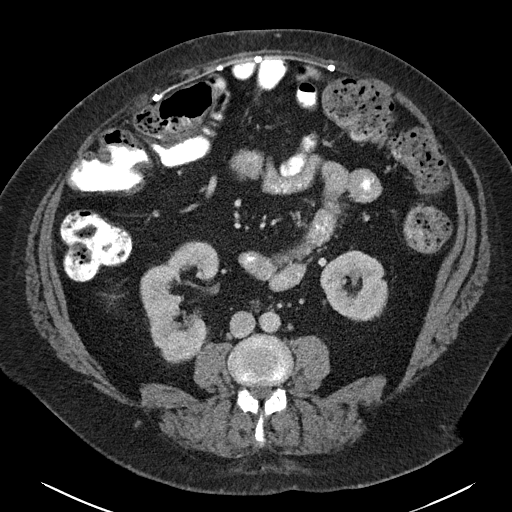
[im 57/99  soft-tissue]
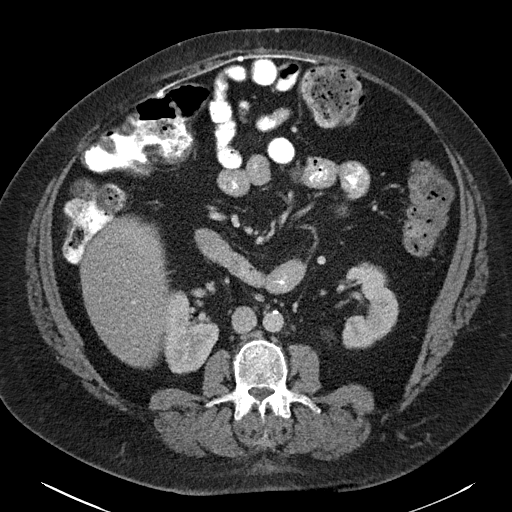
[im 62/99  soft-tissue]
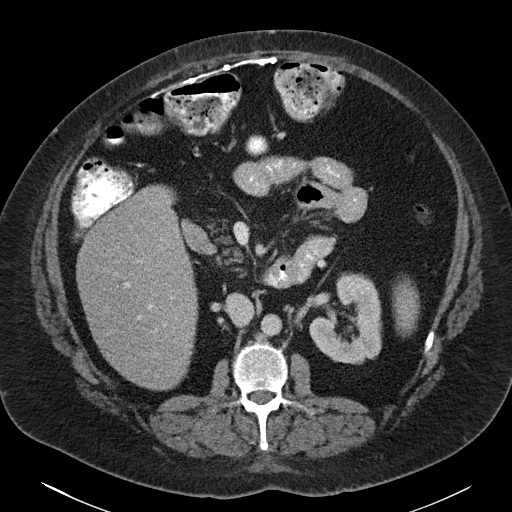
[im 62/99  bone]
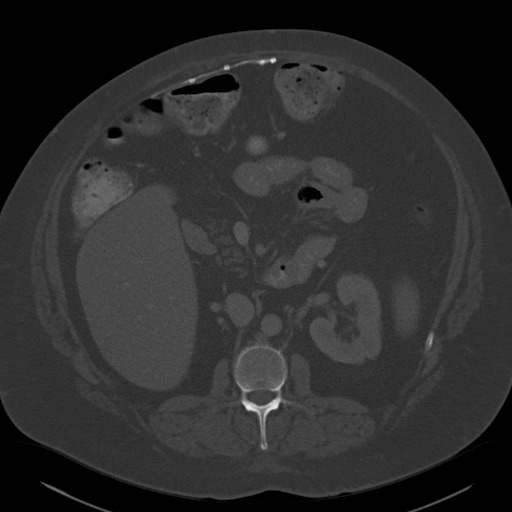
[im 73/99  soft-tissue]
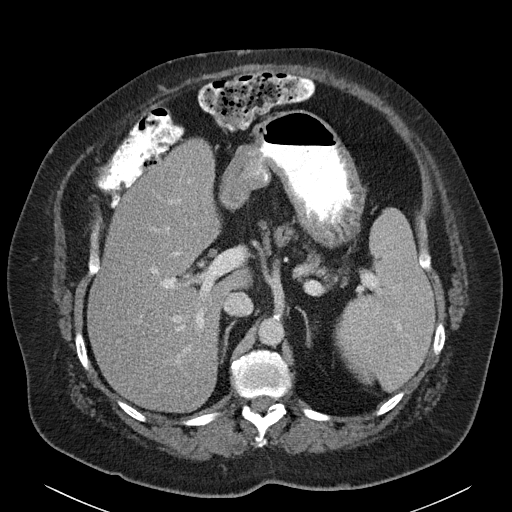
[im 78/99  soft-tissue]
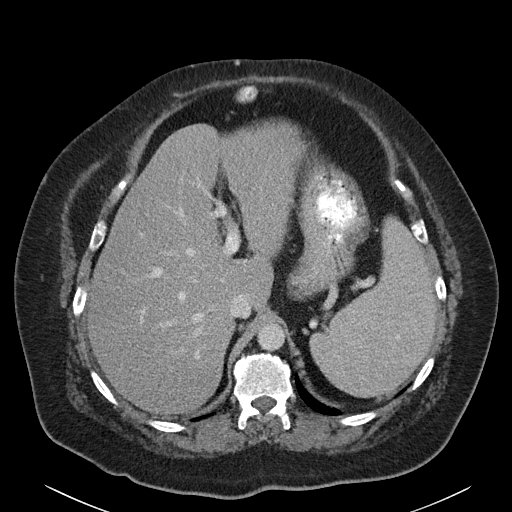
[im 83/99  soft-tissue]
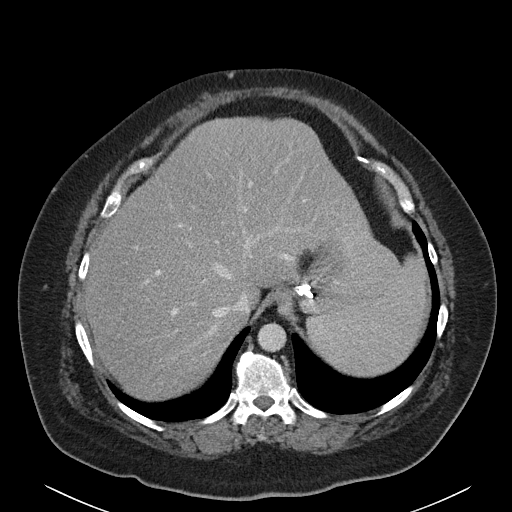
[im 93/99  soft-tissue]
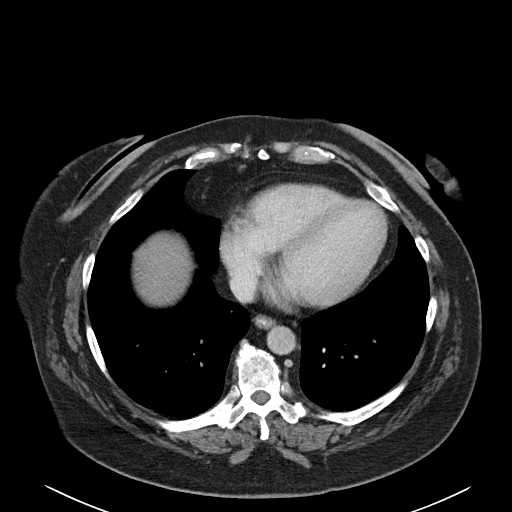

[Series 6: abd/pel w st · coronal · 0.90mm/px · 3 of 117 slices shown]
[im 39/117  soft-tissue]
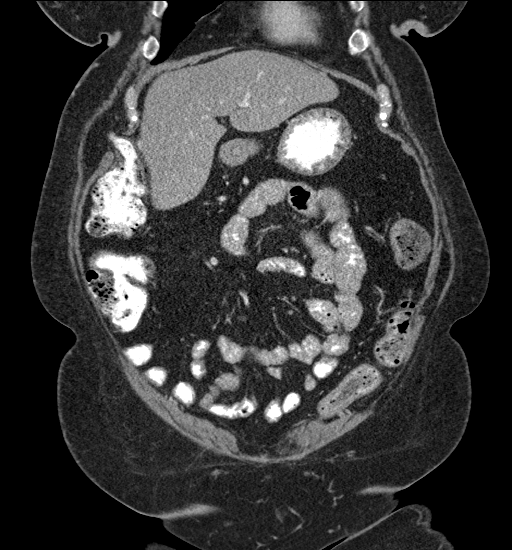
[im 52/117  soft-tissue]
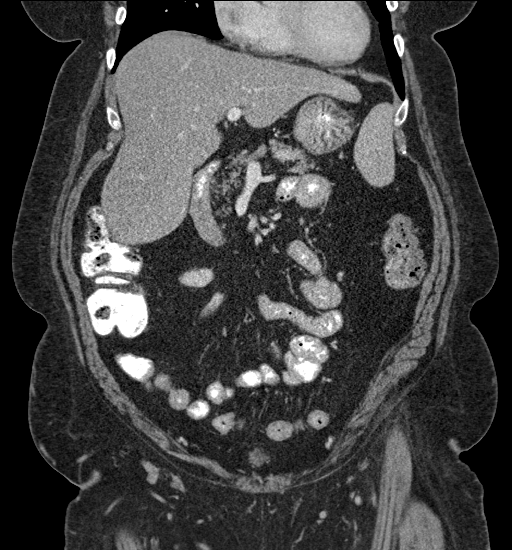
[im 65/117  soft-tissue]
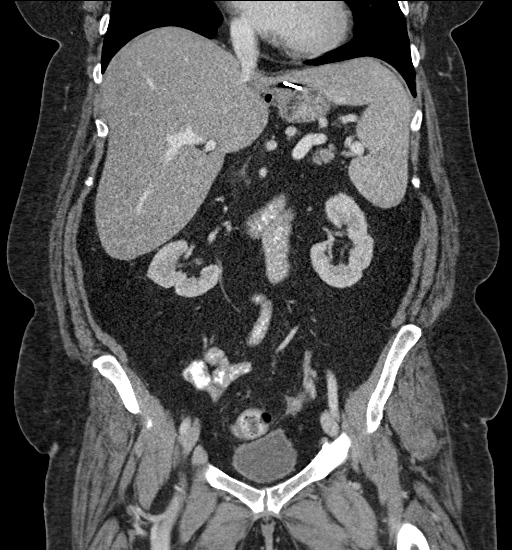

[16 of 46 positions shown; findings below may reference images not displayed]

FINDINGS: Lower chest: The lung bases are clear. Heart is within upper limits
of normal.

Hepatobiliary: The liver appears somewhat low in attenuation which
may indicate hepatic steatosis and the liver appears somewhat
prominent. Surgical clips are present from prior cholecystectomy.

Pancreas: The pancreas is fatty infiltrated. No ductal dilatation is
seen.

Spleen: The spleen is stable with no intrinsic abnormality noted.

Adrenals/Urinary Tract: The adrenal glands appear normal. The
kidneys enhance and there is no evidence of hydronephrosis. No renal
calculi are seen. On delayed images, the pelvocaliceal systems are
unremarkable. The ureters appear normal in caliber. The urinary
bladder is not well distended but no abnormality is seen.

Stomach/Bowel: The stomach is not well distended but no abnormality
is evident. No small bowel distention is seen. There is a moderate
amount of feces throughout the colon. The terminal ileum is
unremarkable. The appendix has by history been resected.

Vascular/Lymphatic: The abdominal aorta is normal in caliber with
moderate abdominal aortic atherosclerosis. No adenopathy is seen.

Reproductive: The uterus by history has been resected. No adnexal
lesion is seen. No fluid is noted within the pelvis.

Other: There is mesh along the anterior abdominal wall. No recurrent
abdominal wall hernia is seen.

Musculoskeletal: The lumbar vertebrae are in normal alignment with
normal intervertebral disc spaces.
IMPRESSION: 1. No explanation for the patient's mid abdominal pain is seen.
2. Cannot exclude hepatic steatosis.
3. Moderate amount of feces throughout the entire colon.
4. Moderate abdominal aortic atherosclerosis.

## 2018-12-27 ENCOUNTER — Telehealth: Payer: Self-pay | Admitting: *Deleted

## 2018-12-27 NOTE — Telephone Encounter (Signed)
I am calling to let you know we need to reschedule your surgery that we had scheduled for February 08, 2019.  [facility] has canceled all elective surgeries.  I can put you down tentatively for Mar 01, 2019 if you would like.  "That's fine, what time?"  I cannot give you a time.  The time actually hasn't opened yet.  I'm going to put you down tentatively for that date.  If any problems arise, I'll let you know.  "Okay, that's fine."

## 2018-12-29 ENCOUNTER — Ambulatory Visit: Payer: BLUE CROSS/BLUE SHIELD

## 2019-01-03 ENCOUNTER — Telehealth: Payer: Self-pay

## 2019-01-03 ENCOUNTER — Ambulatory Visit: Payer: BLUE CROSS/BLUE SHIELD | Admitting: Endocrinology

## 2019-01-03 NOTE — Telephone Encounter (Signed)
Yes- offer webex- at this point any opportunity we get for webex- please schedule as such (no acute respiratory complaints in clinic at the moment like cough )

## 2019-01-03 NOTE — Telephone Encounter (Signed)
Mrs. Fredrick called office with c/o chest tightness and dry cough.  Pt explains that she has had a dry cough for the past 7 days without fever.   Pt said that she has not traveled anywhere but has had some SOB which she said is due to subcortical microvascular, she thinks.  Pt explains that she has been taking Tussinex cough syrup at night for the past 7 days.  I informed pt that I would send message to Dr. Yong Channel and call her back to see if she should be seen here.  I explained to pt that we are offering Webex visits to pts as of now.   Please advise.

## 2019-01-04 ENCOUNTER — Ambulatory Visit (INDEPENDENT_AMBULATORY_CARE_PROVIDER_SITE_OTHER): Payer: 59 | Admitting: Family Medicine

## 2019-01-04 ENCOUNTER — Encounter: Payer: Self-pay | Admitting: Family Medicine

## 2019-01-04 VITALS — HR 72 | Temp 98.1°F | Wt 261.0 lb

## 2019-01-04 DIAGNOSIS — D860 Sarcoidosis of lung: Secondary | ICD-10-CM | POA: Diagnosis not present

## 2019-01-04 DIAGNOSIS — J4550 Severe persistent asthma, uncomplicated: Secondary | ICD-10-CM | POA: Diagnosis not present

## 2019-01-04 DIAGNOSIS — J449 Chronic obstructive pulmonary disease, unspecified: Secondary | ICD-10-CM | POA: Diagnosis not present

## 2019-01-04 MED ORDER — ALBUTEROL SULFATE HFA 108 (90 BASE) MCG/ACT IN AERS: 1.0000 | INHALATION_SPRAY | Freq: Four times a day (QID) | RESPIRATORY_TRACT | 5 refills | Status: DC | PRN

## 2019-01-04 MED ORDER — FLUTICASONE FUROATE-VILANTEROL 200-25 MCG/INH IN AEPB: 1.0000 | INHALATION_SPRAY | Freq: Every day | RESPIRATORY_TRACT | 3 refills | Status: DC

## 2019-01-04 MED ORDER — DOXYCYCLINE HYCLATE 100 MG PO TABS: 100.0000 mg | ORAL_TABLET | Freq: Two times a day (BID) | ORAL | 0 refills | Status: AC

## 2019-01-04 NOTE — Telephone Encounter (Signed)
Patient is scheduled for Webex visit.

## 2019-01-04 NOTE — Patient Instructions (Signed)
Video visit due to coronavirus

## 2019-01-04 NOTE — Progress Notes (Signed)
Phone [phone]   Subjective:  Virtual visit via Video note  Our team/I connected with [name] on 01/04/19 at  8:40 AM EDT by a video enabled telemedicine application (webex) and verified that I am speaking with the correct person using two identifiers.  Location patient: [facility] Location provider: [facility], office Persons participating in the virtual visit: patient and husband  Our team/I discussed the limitations of evaluation and management by telemedicine and the availability of in person appointments. In light of current covid-19 pandemic, patient also understands that we are trying to protect them by minimizing in office contact if at all possible.  The patient expressed consent for telemedicine visit and agreed to proceed.   ROS- no chest pain. Does have some wheezing and shortness of breath with walking. No fever.    Past Medical History-  Patient Active Problem List   Diagnosis Date Noted  . Constipation 04/21/2018    Priority: High  . Obstructive chronic bronchitis without exacerbation ([place]) 09/18/2013    Priority: High  . Chest pain 04/11/2013    Priority: High  . Hypertensive heart disease with chronic diastolic congestive heart failure ([name])     Priority: High  . Sarcoidosis of lung ([place]) 04/10/2007    Priority: High  . Type II diabetes mellitus with renal manifestations ([place]) 08/21/2006    Priority: High  . Epiglottitis     Priority: Medium  . Osteoarthritis of right knee 01/26/2017    Priority: Medium  . CKD (chronic kidney disease) stage 3, GFR 30-59 ml/min (HCC) 04/27/2015    Priority: Medium  . History of osteomyelitis 03/27/2015    Priority: Medium  . Fatty liver 09/30/2014    Priority: Medium  . Depression     Priority: Medium  . Gout 08/20/2010    Priority: Medium  . Anemia 09/18/2009    Priority: Medium  . Sleep apnea 04/21/2009    Priority: Medium  . Hyperlipidemia 08/21/2006    Priority: Medium  . Essential hypertension  08/21/2006    Priority: Medium  . Total knee replacement status, left 12/07/2017    Priority: Low  . Dysphagia 09/20/2017    Priority: Low  . Plantar fasciitis, right 07/13/2017    Priority: Low  . S/P total knee arthroplasty, right 06/29/2017    Priority: Low  . Osteoarthrosis, localized, primary, knee, right     Priority: Low  . Sprain of calcaneofibular ligament of right ankle 06/06/2017    Priority: Low  . Onychomycosis 10/27/2015    Priority: Low  . MRSA (methicillin resistant staph aureus) culture positive 03/27/2015    Priority: Low  . Hot flashes 07/15/2014    Priority: Low  . Abnormal SPEP 04/17/2014    Priority: Low  . Fracture of left leg 04/17/2014    Priority: Low  . Cushingoid side effect of steroids ([place]) 04/17/2014    Priority: Low  . Internal hemorrhoids     Priority: Low  . Preoperative clearance 03/25/2014    Priority: Low  . Solitary pulmonary nodule, on CT 02/2013 - stable over 2 years in 2015 02/20/2013    Priority: Low  . Morbid obesity ([place]) 06/04/2009    Priority: Low  . GERD 08/21/2006    Priority: Low  . Severe persistent asthma 01/04/2019  . Subcortical microvascular ischemic occlusive disease 07/12/2018  . Rapid palpitations 07/12/2018  . Impingement of left ankle joint 06/26/2018  . Anxiety 04/05/2018    Medications- reviewed and updated Current Outpatient Medications  Medication Sig Dispense  Refill  . albuterol (PROVENTIL HFA;VENTOLIN HFA) 108 (90 Base) MCG/ACT inhaler Inhale 1-2 puffs into the lungs every 6 (six) hours as needed for wheezing or shortness of breath. 8 g 5  . allopurinol (ZYLOPRIM) 100 MG tablet Take 100 mg by mouth 2 (two) times daily.     [name] aspirin EC 81 MG tablet Take 81 mg by mouth daily.     [name] atorvastatin (LIPITOR) 40 MG tablet TAKE 1 TAB BY MOUTH ONCE DAILY 90 tablet 3  . BAYER MICROLET LANCETS lancets Use as instructed to check sugar 3 times daily 300 each 5  . buPROPion (WELLBUTRIN XL) 300 MG 24 hr tablet TAKE 1  TABLET BY MOUTH EVERY DAY 90 tablet 3  . Calcium Citrate 200 MG TABS Take 200 mg by mouth daily.    . carvedilol (COREG) 25 MG tablet TAKE 1 TABLET (25 MG TOTAL) BY MOUTH 2 (TWO) TIMES DAILY WITH A MEAL. 180 tablet 3  . Cholecalciferol (VITAMIN D) 2000 units tablet Take 2,000 Units by mouth daily.    . Cyanocobalamin (VITAMIN B-12 PO) Take 2,500 mcg by mouth daily.     [name] dexlansoprazole (DEXILANT) 60 MG capsule Take 1 capsule (60 mg total) by mouth daily. 30 capsule 5  . diltiazem (CARDIZEM CD) 120 MG 24 hr capsule TAKE 1 CAPSULE BY MOUTH EVERY DAY (FOR FUTURE REFILLS MAKE A DOCTORS VISIT) (Patient taking differently: Take 120 mg by mouth daily. ) 90 capsule 3  . famotidine (PEPCID) 20 MG tablet Take 1 tablet (20 mg total) by mouth at bedtime. 30 tablet 3  . fenofibrate 160 MG tablet Please specify directions, refills and quantity 30 tablet 4  . fluticasone (FLONASE) 50 MCG/ACT nasal spray PLACE 2 SPRAYS INTO BOTH NOSTRILS DAILY AS NEEDED FOR ALLERGIES 48 g 1  . fluticasone furoate-vilanterol (BREO ELLIPTA) 200-25 MCG/INH AEPB Inhale 1 puff into the lungs daily. 60 each 3  . furosemide (LASIX) 40 MG tablet TAKE 1 TABLET BY MOUTH TWICE A DAY 180 tablet 3  . gabapentin (NEURONTIN) 100 MG capsule TAKE 1 CAPSULE BY MOUTH THREE TIMES A DAY 270 capsule 3  . glucose blood (BAYER CONTOUR TEST) test strip Use as instructed to check sugar 3 times daily 300 each 5  . Insulin Glargine (BASAGLAR KWIKPEN) 100 UNIT/ML SOPN Inject 0.4 mLs (40 Units total) into the skin at bedtime. 4 pen 2  . insulin lispro (HUMALOG KWIKPEN) 100 UNIT/ML KwikPen Take PRN, for a total of 25 units per day 5 pen 11  . Insulin Pen Needle 33G X 4 MM MISC 1 each by Does not apply route 4 (four) times daily. 200 each 11  . ipratropium (ATROVENT) 0.06 % nasal spray Place 2 sprays into both nostrils 4 (four) times daily. (Patient taking differently: Place 2 sprays into both nostrils 2 (two) times daily as needed for rhinitis. ) 15 mL 0  .  KLOR-CON M20 20 MEQ tablet Take 30 mEq by mouth 2 (two) times daily.   2  . linaclotide (LINZESS) 290 MCG CAPS capsule Take 1 capsule (290 mcg total) by mouth daily before breakfast. (Patient taking differently: Take 290 mcg by mouth every other day. ) 30 capsule 11  . LORazepam (ATIVAN) 0.5 MG tablet Take 0.5 mg by mouth every 12 (twelve) hours as needed for anxiety.    . metFORMIN (GLUCOPHAGE) 1000 MG tablet TAKE 1 TABLET BY MOUTH TWICE A DAY WITH MEALS 180 tablet 0  . naproxen (NAPROSYN) 500 MG tablet Take 1 tablet (500  mg total) by mouth 2 (two) times daily with a meal. 60 tablet 1  . nitroGLYCERIN (NITROSTAT) 0.4 MG SL tablet Place 1 tablet (0.4 mg total) under the tongue every 5 (five) minutes as needed for chest pain. Reported on 01/05/2016 25 tablet 6  . ondansetron (ZOFRAN-ODT) 4 MG disintegrating tablet Take 1 tablet (4 mg total) by mouth every 8 (eight) hours as needed for nausea or vomiting. 50 tablet 2  . oxyCODONE-acetaminophen (PERCOCET) 5-325 MG tablet Take 1 tablet by mouth every 6 (six) hours as needed for severe pain. 30 tablet 0  . PARoxetine Mesylate (BRISDELLE) 7.5 MG CAPS Take 7.5 mg by mouth daily.     . predniSONE (DELTASONE) 5 MG tablet Take 5 mg by mouth daily with breakfast.    . sulfamethoxazole-trimethoprim (BACTRIM DS,SEPTRA DS) 800-160 MG tablet Take 1 tablet by mouth 2 (two) times daily. 14 tablet 0  . telmisartan (MICARDIS) 20 MG tablet TAKE 1 TABLET BY MOUTH EVERY DAY 90 tablet 1  . triamcinolone cream (KENALOG) 0.1 % Apply 1 application topically 2 (two) times daily as needed (for rash. stop after 7-10 days.). 80 g 0  . venlafaxine XR (EFFEXOR-XR) 75 MG 24 hr capsule Take 1 capsule (75 mg total) by mouth 2 (two) times daily. 180 capsule 3  . doxycycline (VIBRA-TABS) 100 MG tablet Take 1 tablet (100 mg total) by mouth 2 (two) times daily for 7 days. 14 tablet 0  . isosorbide mononitrate (IMDUR) 30 MG 24 hr tablet Take 1 tablet (30 mg total) by mouth at bedtime. 90  tablet 3   No current facility-administered medications for this visit.      Objective:  Pulse 72   Temp 98.1 F (36.7 C)   Wt 261 lb (118.4 kg) Comment: home weight and vitals  SpO2 99%   BMI 42.13 kg/m  Gen: NAD, resting comfortably with intermittent cough Lungs: slight increase in respiratory rate Skin: warm, dry, no obvious rash    Assessment and Plan    #Sarcoidosis of lung/obstructive chronic bronchitis/asthma with 7 days of worsening cough and shortness of breath S: shortness of breath has increased, wheezing, dry cough. Started a week ago. Started out just at night but it worsened over time. tussionex helps but made her feel drowsy. Tried dimetap instead and some mild relief. Also has tried delsym at night. Wheeze is not really bad. Mild sinus congestion but not as bad as some previous sinus infections. Shortness of breath is not beyond typical illness for her. Seems to be worsening.   No known covid 19 contacts. Staying indoors most of time. No sick contacts.   Follows with [facility] pulmonology for sarcoidosis and asthma- lung scans have more showed restrictive lung disease recently and without obvious sarcoidosis activity  A/P: Patient is extremely high risk for hospitalization-has been hospitalized before for what seem to be simply a URI which rapidly declined.  In light of current coronavirus/covid-19 we are going to try to keep her out of the hospital-treat with doxycycline for 7 days.  We discussed if she has more wheeze or shortness of breath that we would need to send in prednisone as well-for now trying to hold off if at all possible given unclear role in COVID-19.  We discussed we have to essentially presume everything is COVID-19 and I gave her recommendations to remain at home  As of 12/30/2018 [facility] is no longer recommending that isolation orders be routinely issued for all confirmed cases. All persons with fever and  respiratory symptoms (including those with  laboratory-confirmed COVID-19) should isolate themselves until the below conditions are met: o At least 7 days since symptom onset and o ?72 hours after symptom resolution (absence of fever without the use of fever-reducing medication and improvement in respiratory symptoms)  . [facility] is no longer recommending that quarantine orders be routinely issued for close contacts to confirmed cases. Persons who have had close contact with a person with respiratory illness are encouraged to stay home to the extent possible and monitor themselves for symptoms.   Current guidelines recommend patients self isolate o At least 7 days since symptom onset and o ?72 hours after symptom resolution (absence of fever without the use of fever-reducing medication and improvement in respiratory symptoms)  If she is not better by Sunday evening or she worsens-we opted to send in prednisone.  If her oxygen levels get below 92% we discussed going to the hospital. - encouraged her to continue breo- sent in refill for her as she is about to run out - continue 5 mg prednisone for now per [facility] pulmonology  Future Appointments  Date Time Provider [place]  01/10/2019  2:15 PM Edrick Kins, DPM [facility]  01/22/2019  9:20 AM Leonie Man, MD [facility]  01/25/2019  9:50 AM [facility] MM 3 [facility]  02/12/2019 10:30 AM Renato Shin, MD [facility] None   No follow-ups on file.  Lab/Order associations: Sarcoidosis of lung ([place])  Obstructive chronic bronchitis without exacerbation (HCC)  Severe persistent asthma without complication  Meds ordered this encounter  Medications  . doxycycline (VIBRA-TABS) 100 MG tablet    Sig: Take 1 tablet (100 mg total) by mouth 2 (two) times daily for 7 days.    Dispense:  14 tablet    Refill:  0  . albuterol (PROVENTIL HFA;VENTOLIN HFA) 108 (90 Base) MCG/ACT inhaler    Sig: Inhale 1-2 puffs into the lungs every 6 (six) hours as needed for  wheezing or shortness of breath.    Dispense:  8 g    Refill:  5  . fluticasone furoate-vilanterol (BREO ELLIPTA) 200-25 MCG/INH AEPB    Sig: Inhale 1 puff into the lungs daily.    Dispense:  60 each    Refill:  3    Return precautions advised.  Garret Reddish, MD

## 2019-01-07 ENCOUNTER — Encounter: Payer: Self-pay | Admitting: Family Medicine

## 2019-01-08 MED ORDER — PREDNISONE 20 MG PO TABS: ORAL_TABLET | ORAL | 0 refills | Status: DC

## 2019-01-10 ENCOUNTER — Ambulatory Visit: Payer: Medicare Other | Admitting: Podiatry

## 2019-01-16 ENCOUNTER — Encounter: Payer: Self-pay | Admitting: Family Medicine

## 2019-01-17 ENCOUNTER — Telehealth: Payer: Self-pay | Admitting: Cardiology

## 2019-01-17 NOTE — Telephone Encounter (Signed)
New Message    [name] retuning call for Ivin Booty   Please call back

## 2019-01-17 NOTE — Telephone Encounter (Signed)
   Primary Cardiologist:  [name], MD   Patient contacted.  History reviewed.  No symptoms to suggest any unstable cardiac conditions.  Based on discussion, with current pandemic situation, we will be postponing this appointment for [name] with a plan for f/u on 06/25/19 at 9:20 am or sooner if feasible/necessary.  If symptoms change, she has been instructed to contact our office.    Raiford Simmonds, RN  01/17/2019 1:19 PM         .

## 2019-01-22 ENCOUNTER — Ambulatory Visit: Payer: Medicare Other | Admitting: Cardiology

## 2019-01-22 ENCOUNTER — Telehealth: Payer: Self-pay | Admitting: *Deleted

## 2019-01-22 NOTE — Telephone Encounter (Signed)
I attempted to call Mrs. Nagy.  I left a message with her husband that Dr. Amalia Hailey cannot do her surgery on March 26, 2019 so we're going to change it to the following week on April 02, 2019.  He stated he would let her know about the change.

## 2019-01-22 NOTE — Telephone Encounter (Signed)
I need to reschedule your surgery again.  "Okay, what date are you looking at?"  Dr. Amalia Hailey can do it on March 26, 2019 at 5 pm.  "Okay, that will be fine.  I'm scheduled to see him on April 27.  Should I keep that appointment?"  Yes, keep that appointment because we need an updated consent form.

## 2019-01-24 ENCOUNTER — Other Ambulatory Visit: Payer: Self-pay | Admitting: Cardiology

## 2019-01-24 NOTE — Telephone Encounter (Signed)
Fenofibrate 145 refilled.

## 2019-01-25 ENCOUNTER — Ambulatory Visit: Payer: 59

## 2019-01-29 ENCOUNTER — Telehealth (INDEPENDENT_AMBULATORY_CARE_PROVIDER_SITE_OTHER): Payer: Self-pay | Admitting: Orthopedic Surgery

## 2019-01-29 ENCOUNTER — Other Ambulatory Visit (INDEPENDENT_AMBULATORY_CARE_PROVIDER_SITE_OTHER): Payer: Self-pay | Admitting: Orthopedic Surgery

## 2019-01-29 MED ORDER — OXYCODONE-ACETAMINOPHEN 5-325 MG PO TABS: 1.0000 | ORAL_TABLET | Freq: Three times a day (TID) | ORAL | 0 refills | Status: DC | PRN

## 2019-01-29 NOTE — Telephone Encounter (Signed)
[name] needs shoulder surgery, but it is on hold due to COVID-19.  She would like to see if Dr. Sharol Given will call her in something for pain to hold her over until she is on the surgical schedule again.  [facility] uses the [name] on [address].

## 2019-01-29 NOTE — Telephone Encounter (Signed)
rx sent for percocet

## 2019-01-29 NOTE — Telephone Encounter (Signed)
Please see message below and advise.

## 2019-01-30 ENCOUNTER — Telehealth: Payer: Self-pay | Admitting: *Deleted

## 2019-01-30 MED ORDER — NAPROXEN 500 MG PO TABS: 500.0000 mg | ORAL_TABLET | Freq: Two times a day (BID) | ORAL | 1 refills | Status: DC

## 2019-01-30 NOTE — Telephone Encounter (Signed)
Called pt to advise faxed to [facility] on [address].

## 2019-01-30 NOTE — Telephone Encounter (Signed)
Refill request for naproxen. Dr. Amalia Hailey states refill as previously and pt needs an appt prior to future refills.

## 2019-01-31 ENCOUNTER — Other Ambulatory Visit: Payer: Self-pay

## 2019-01-31 MED ORDER — GLUCOSE BLOOD VI STRP: ORAL_STRIP | 5 refills | Status: DC

## 2019-02-05 ENCOUNTER — Other Ambulatory Visit: Payer: Self-pay

## 2019-02-05 ENCOUNTER — Ambulatory Visit (INDEPENDENT_AMBULATORY_CARE_PROVIDER_SITE_OTHER): Payer: 59 | Admitting: Podiatry

## 2019-02-05 ENCOUNTER — Encounter: Payer: Self-pay | Admitting: Podiatry

## 2019-02-05 DIAGNOSIS — M2041 Other hammer toe(s) (acquired), right foot: Secondary | ICD-10-CM

## 2019-02-05 DIAGNOSIS — M79671 Pain in right foot: Secondary | ICD-10-CM | POA: Diagnosis not present

## 2019-02-05 NOTE — Patient Instructions (Signed)
Pre-Operative Instructions  Congratulations, you have decided to take an important step towards improving your quality of life.  You can be assured that the doctors and staff at [facility] will be with you every step of the way.  Here are some important things you should know:  1. Plan to be at the surgery center/hospital at least 1 (one) hour prior to your scheduled time, unless otherwise directed by the surgical center/hospital staff.  You must have a responsible adult accompany you, remain during the surgery and drive you home.  Make sure you have directions to the surgical center/hospital to ensure you arrive on time. 2. If you are having surgery at [facility] or [facility] hospitals, you will need a copy of your medical history and physical form from your family physician within one month prior to the date of surgery. We will give you a form for your primary physician to complete.  3. We make every effort to accommodate the date you request for surgery.  However, there are times where surgery dates or times have to be moved.  We will contact you as soon as possible if a change in schedule is required.   4. No aspirin/ibuprofen for one week before surgery.  If you are on aspirin, any non-steroidal anti-inflammatory medications (Mobic, Aleve, Ibuprofen) should not be taken seven (7) days prior to your surgery.  You make take Tylenol for pain prior to surgery.  5. Medications - If you are taking daily heart and blood pressure medications, seizure, reflux, allergy, asthma, anxiety, pain or diabetes medications, make sure you notify the surgery center/hospital before the day of surgery so they can tell you which medications you should take or avoid the day of surgery. 6. No food or drink after midnight the night before surgery unless directed otherwise by surgical center/hospital staff. 7. No alcoholic beverages 24-hours prior to surgery.  No smoking 24-hours prior or 24-hours after  surgery. 8. Wear loose pants or shorts. They should be loose enough to fit over bandages, boots, and casts. 9. Don't wear slip-on shoes. Sneakers are preferred. 10. Bring your boot with you to the surgery center/hospital.  Also bring crutches or a walker if your physician has prescribed it for you.  If you do not have this equipment, it will be provided for you after surgery. 11. If you have not been contacted by the surgery center/hospital by the day before your surgery, call to confirm the date and time of your surgery. 12. Leave-time from work may vary depending on the type of surgery you have.  Appropriate arrangements should be made prior to surgery with your employer. 13. Prescriptions will be provided immediately following surgery by your doctor.  Fill these as soon as possible after surgery and take the medication as directed. Pain medications will not be refilled on weekends and must be approved by the doctor. 14. Remove nail polish on the operative foot and avoid getting pedicures prior to surgery. 15. Wash the night before surgery.  The night before surgery wash the foot and leg well with water and the antibacterial soap provided. Be sure to pay special attention to beneath the toenails and in between the toes.  Wash for at least three (3) minutes. Rinse thoroughly with water and dry well with a towel.  Perform this wash unless told not to do so by your physician.  Enclosed: 1 Ice pack (please put in freezer the night before surgery)   1 Hibiclens skin cleaner     Pre-op instructions  If you have any questions regarding the instructions, please do not hesitate to call our office.  [place]: [address], [place], [place] [address] -- [phone]  [place]: [address] [place], [place] [address] -- [phone]  [place]: [address]  [place], [place] [address] -- [phone]  [place]: [address], [address], [place], [place] [address] -- [phone]  Website:  [email or web address] 

## 2019-02-05 NOTE — Progress Notes (Signed)
   Subjective:  Patient presents today status post left forefoot reconstructive surgery. DOS: 10/02/18.  Patient states that she is doing very well and has little complaints in regards to the left foot.  Prior to the COVID-19 pandemic she was scheduled to have surgery on her right foot.  Surgery is been postponed secondary to the pandemic and delay of elective surgical cases.  She presents today to discuss different treatment options and to attempt to proceed with surgical intervention for her right foot.  All conservative measures have failed to provide any sort of alleviation of symptoms for the patient including shoe gear modifications and anti-inflammatories.  Past Medical History:  Diagnosis Date  . Abnormal SPEP 04/17/2014  . Acute left ankle pain 01/26/2017  . ANEMIA-UNSPECIFIED 09/18/2009  . Anxiety   . Arthritis   . CHF (congestive heart failure) ([place])   . Chronic diastolic heart failure, NYHA class 2 (HCC)    Normal LVEDP by May 2018  . COPD (chronic obstructive pulmonary disease) ([place])   . Depression   . DIABETES MELLITUS, TYPE II 08/21/2006  . Diabetic osteomyelitis ([place]) 05/29/2015  . Fracture of 5th metatarsal    non union  . GERD 07/17/1218   Had fundoplication  . GOUT 08/20/2010  . Hx of umbilical hernia repair   . HYPERLIPIDEMIA 08/21/2006  . HYPERTENSION 08/21/2006  . Infection of wound due to methicillin resistant Staphylococcus aureus (MRSA)   . Internal hemorrhoids   . Multiple allergies 10/14/2016  . OBESITY 06/04/2009  . Onychomycosis 10/27/2015  . Osteomyelitis of left foot ([place]) 05/29/2015  . Pulmonary sarcoidosis ([place])    Followed locally by pulmonology, but also by Dr. Casper Harrison at [facility]  . Right knee pain 01/26/2017  . Vocal cord dysfunction   . Wears partial dentures       Objective/Physical Exam Neurovascular status intact.  Skin incisions are healed.  No open wounds noted.  Moderate edema still noted throughout the digits of the surgical  foot left lower extremity.  Right foot hammertoes noted digits 2-5 right foot.  Patient continues to have significant pain on palpation and range of motion to these hammertoe deformities.  Assessment: 1. s/p left forefoot reconstructive surgery. DOS: 10/02/18 2.  Hammertoe deformity digits 2-5 right foot   Plan of Care:  1. Patient was evaluated.  2.  Authorization for surgery was re-initiated on 10/09/2018 for the right foot.  We will continue with the same surgical plan once insurance approves.  The patient has tried multiple conservative modalities to alleviate the patient's symptoms without any satisfactory alleviation.  Patient has tried shoe gear modification and anti-inflammatories.  Surgery will consist of PIPJ arthroplasty with possible internal screw fixation digits 2, 3 and 4 right; PIPJ arthroplasty digit 5 right.  3.  Return to clinic 1 week postop   Edrick Kins, DPM [facility]  Dr. Edrick Kins, [address]                                        [place], [place] [address]                Office [phone]  Fax [phone]

## 2019-02-09 ENCOUNTER — Encounter: Payer: Self-pay | Admitting: *Deleted

## 2019-02-09 NOTE — Progress Notes (Signed)
Per Dr. Amalia Hailey, I sent a medical clearance request letter to Dr. Garret Reddish.

## 2019-02-10 ENCOUNTER — Other Ambulatory Visit: Payer: Self-pay | Admitting: Endocrinology

## 2019-02-10 NOTE — Telephone Encounter (Signed)
Please refill x 1 F/u is due  

## 2019-02-12 ENCOUNTER — Other Ambulatory Visit: Payer: Self-pay

## 2019-02-12 ENCOUNTER — Ambulatory Visit (INDEPENDENT_AMBULATORY_CARE_PROVIDER_SITE_OTHER): Payer: 59 | Admitting: Endocrinology

## 2019-02-12 DIAGNOSIS — Z794 Long term (current) use of insulin: Secondary | ICD-10-CM

## 2019-02-12 DIAGNOSIS — E1129 Type 2 diabetes mellitus with other diabetic kidney complication: Secondary | ICD-10-CM

## 2019-02-12 MED ORDER — GLUCOSE BLOOD VI STRP: 1.0000 | ORAL_STRIP | Freq: Two times a day (BID) | 3 refills | Status: DC

## 2019-02-12 MED ORDER — BASAGLAR KWIKPEN 100 UNIT/ML ~~LOC~~ SOPN: 50.0000 [IU] | PEN_INJECTOR | SUBCUTANEOUS | 2 refills | Status: DC

## 2019-02-12 MED ORDER — ONETOUCH VERIO FLEX SYSTEM W/DEVICE KIT: 1.0000 | PACK | Freq: Once | 0 refills | Status: AC

## 2019-02-12 MED ORDER — INSULIN LISPRO (1 UNIT DIAL) 100 UNIT/ML (KWIKPEN): 30.0000 [IU] | PEN_INJECTOR | Freq: Every day | SUBCUTANEOUS | 11 refills | Status: DC

## 2019-02-12 NOTE — Progress Notes (Signed)
Subjective:    Patient ID: [name], female    DOB: 1961/01/16, 58 y.o.   MRN: [id number]  HPI  telehealth visit today via doxy video visit.  Alternatives to telehealth are presented to this patient, and the patient agrees to the telehealth visit. [name] advised of the cost of the visit, and agrees to this, also.   Patient is at home, and I am at the office.   Persons attending the telehealth visit: the patient, husband, and I Pt returns for f/u of diabetes mellitus: DM type: Insulin-requiring type 2 Dx'ed: 4765 Complications: polyneuropathy, renal insuff, and mild CAD Therapy: insulin since 2006, and 2 oral meds.  GDM: never DKA: never Severe hypoglycemia: never Pancreatitis: never Pancreatic imaging: CT (2015) showed fatty atrophy.  Other: she takes multiple daily injections; she intermittently takes prednisone for sarcoidosis or AB.  Interval history: pt states cbg's vary from 140-190. It is in general higher as the day goes on.  She recently took prednisone again, for AB.  She is now back to her baseline of 5 mg QD.  She says this increased cbg's to 400, but is has since returned to the 100's.  She takes basaglar, 40/d, and humalog, 30 units qhs.  She also sometimes takes PRN humalog (averages 10-15 total units per day, when not on prednisone).   Past Medical History:  Diagnosis Date  . Abnormal SPEP 04/17/2014  . Acute left ankle pain 01/26/2017  . ANEMIA-UNSPECIFIED 09/18/2009  . Anxiety   . Arthritis   . CHF (congestive heart failure) ([place])   . Chronic diastolic heart failure, NYHA class 2 (HCC)    Normal LVEDP by May 2018  . COPD (chronic obstructive pulmonary disease) ([place])   . Depression   . DIABETES MELLITUS, TYPE II 08/21/2006  . Diabetic osteomyelitis ([place]) 05/29/2015  . Fracture of 5th metatarsal    non union  . GERD 46/50/3546   Had fundoplication  . GOUT 08/20/2010  . Hx of umbilical hernia repair   . HYPERLIPIDEMIA 08/21/2006  . HYPERTENSION 08/21/2006   . Infection of wound due to methicillin resistant Staphylococcus aureus (MRSA)   . Internal hemorrhoids   . Multiple allergies 10/14/2016  . OBESITY 06/04/2009  . Onychomycosis 10/27/2015  . Osteomyelitis of left foot ([place]) 05/29/2015  . Pulmonary sarcoidosis ([place])    Followed locally by pulmonology, but also by Dr. Casper Harrison at [facility]  . Right knee pain 01/26/2017  . Vocal cord dysfunction   . Wears partial dentures     Past Surgical History:  Procedure Laterality Date  . ABDOMINAL HYSTERECTOMY    . APPENDECTOMY    . BLADDER SUSPENSION  11/11/2011   Procedure: TRANSVAGINAL TAPE (TVT) PROCEDURE;  Surgeon: Olga Millers, MD;  Location: [facility];  Service: Gynecology;  Laterality: N/A;  . CAROTIDS  02/18/11   CAROTID DUPLEX; VERTEBRALS ARE PATENT WITH ANTEGRADE FLOW. ICA/CCA RATIO 1.61 ON RIGHT AND 0.75 ON LEFT  . CHOLECYSTECTOMY  1984  . CYSTOSCOPY  11/11/2011   Procedure: CYSTOSCOPY;  Surgeon: Olga Millers, MD;  Location: [facility];  Service: Gynecology;  Laterality: N/A;  . EXTUBATION (ENDOTRACHEAL) IN OR N/A 09/23/2017   Procedure: EXTUBATION (ENDOTRACHEAL) IN OR;  Surgeon: Helayne Seminole, MD;  Location: [facility];  Service: ENT;  Laterality: N/A;  . FIBEROPTIC LARYNGOSCOPY AND TRACHEOSCOPY N/A 09/23/2017   Procedure: FLEXIBLE FIBEROPTIC LARYNGOSCOPY;  Surgeon: Helayne Seminole, MD;  Location: [facility];  Service: ENT;  Laterality: N/A;  . FRACTURE  SURGERY     foot  . HALLUX FUSION Left 10/02/2018   Procedure: HALLUX ARTHRODESIS;  Surgeon: Edrick Kins, DPM;  Location: [facility];  Service: Podiatry;  Laterality: Left;  . HAMMER TOE SURGERY Left 10/02/2018   Procedure: HAMMER TOE CORRECTION 2ND 3RD 4RD FIFTH TOE;  Surgeon: Edrick Kins, DPM;  Location: [facility];  Service: Podiatry;  Laterality: Left;  . HERNIA REPAIR    . I&D EXTREMITY Left 06/27/2015   Procedure: Partial Excision Left Calcaneus, Place Antibiotic Beads, and Wound VAC;  Surgeon: Newt Minion, MD;   Location: [facility];  Service: Orthopedics;  Laterality: Left;  . KNEE ARTHROSCOPY     right  . LEFT AND RIGHT HEART CATHETERIZATION WITH CORONARY ANGIOGRAM N/A 04/23/2013   Procedure: LEFT AND RIGHT HEART CATHETERIZATION WITH CORONARY ANGIOGRAM;  Surgeon: Leonie Man, MD;  Location: [facility];  Service: Cardiovascular;  Laterality: N/A;  . LEFT HEART CATH AND CORONARY ANGIOGRAPHY N/A 03/11/2017   Procedure: Left Heart Cath and Coronary Angiography;  Surgeon: Sherren Mocha, MD;  Location: [facility]; angiographically minimal CAD in the LAD otherwise normal.;  Normal LVEDP.  FALSE POSITIVE MYOVIEW  . LEFT HEART CATH AND CORONARY ANGIOGRAPHY  07/20/2010   LVEF 50-55% WITH VERY MILD GLOBAL HYPOKINESIA; ESSENTIALLY NORMAL CORONARY ARTERIES; NORMAL LV FUNCTION  . METATARSAL OSTEOTOMY WITH OPEN REDUCTION INTERNAL FIXATION (ORIF) METATARSAL WITH FUSION Left 04/09/2014   Procedure: LEFT FOOT FRACTURE OPEN TREATMENT METATARSAL INCLUDES INTERNAL FIXATION EACH;  Surgeon: Lorn Junes, MD;  Location: [facility];  Service: Orthopedics;  Laterality: Left;  . NISSEN FUNDOPLICATION  6948  . NM MYOVIEW LTD  03/09/2013   Lexiscan --> EF 50%; NORMAL MYOCARDIAL PERFUSION STUDY - breast attenuation  . NM MYOVIEW LTD  03/10/2017   : Moderate size "stress-induced "perfusion defect at the apex as well as "ill-defined stress-induced perfusion defect in the lateral wall.  EF 55%.  INTERMEDIATE risk. -->  FALSE POSITIVE  . Right and left CARDIAC CATHETERIZATION  04/23/2013   Angiographic normal coronaries; LVEDP 20 mmHg, PCWP 12-14 mmHg, RAP 12 mmHg.; Fick CO/CI 4.9/2.2  . TOTAL KNEE ARTHROPLASTY Right 06/29/2017   Procedure: RIGHT TOTAL KNEE ARTHROPLASTY;  Surgeon: Newt Minion, MD;  Location: [facility];  Service: Orthopedics;  Laterality: Right;  . TOTAL KNEE ARTHROPLASTY Left 12/07/2017  . TOTAL KNEE ARTHROPLASTY Left 12/07/2017   Procedure: LEFT TOTAL KNEE ARTHROPLASTY;  Surgeon: Newt Minion,  MD;  Location: [facility];  Service: Orthopedics;  Laterality: Left;  . TRACHEOSTOMY TUBE PLACEMENT N/A 09/20/2017   Procedure: AWAKE INTUBATION WITH ANESTHESIA WITH VIDEO ASSISTANCE;  Surgeon: Helayne Seminole, MD;  Location: [facility];  Service: ENT;  Laterality: N/A;  . TRANSTHORACIC ECHOCARDIOGRAM  08/2014   Normal LV size and function.  Mild LVH.  EF 55-60%.  Normal regional wall motion.  GR 1 DD.  Normal RV size and function .  [name] TUBAL LIGATION     with reversal in 1994  . VENTRAL HERNIA REPAIR      Social History   Socioeconomic History  . Marital status: Married    Spouse name: PHYLICIA MCGAUGH  . Number of children: 2  . Years of education: 23  . Highest education level: Not on file  Occupational History  . Occupation: DISABLED  Social Needs  . Financial resource strain: Not on file  . Food insecurity:    Worry: Not on file    Inability: Not on file  .  Transportation needs:    Medical: Not on file    Non-medical: Not on file  Tobacco Use  . Smoking status: Never Smoker  . Smokeless tobacco: Never Used  Substance and Sexual Activity  . Alcohol use: No    Alcohol/week: 0.0 standard drinks  . Drug use: No  . Sexual activity: Yes    Birth control/protection: Surgical  Lifestyle  . Physical activity:    Days per week: Not on file    Minutes per session: Not on file  . Stress: Not on file  Relationships  . Social connections:    Talks on phone: Not on file    Gets together: Not on file    Attends religious service: Not on file    Active member of club or organization: Not on file    Attends meetings of clubs or organizations: Not on file    Relationship status: Not on file  . Intimate partner violence:    Fear of current or ex partner: Not on file    Emotionally abused: Not on file    Physically abused: Not on file    Forced sexual activity: Not on file  Other Topics Concern  . Not on file  Social History Narrative   Married 1994. 2 sons who both live close and  1 grandson.       Disability due to sarcoidosis. 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 albuterol (PROVENTIL HFA;VENTOLIN HFA) 108 (90 Base) MCG/ACT inhaler Inhale 1-2 puffs into the lungs every 6 (six) hours as needed for wheezing or shortness of breath. 8 g 5  . allopurinol (ZYLOPRIM) 100 MG tablet Take 100 mg by mouth 2 (two) times daily.     [name] aspirin EC 81 MG tablet Take 81 mg by mouth daily.     [name] atorvastatin (LIPITOR) 40 MG tablet TAKE 1 TAB BY MOUTH ONCE DAILY 90 tablet 3  . BAYER MICROLET LANCETS lancets Use as instructed to check sugar 3 times daily 300 each 5  . buPROPion (WELLBUTRIN XL) 300 MG 24 hr tablet TAKE 1 TABLET BY MOUTH EVERY DAY 90 tablet 3  . Calcium Citrate 200 MG TABS Take 200 mg by mouth daily.    . carvedilol (COREG) 25 MG tablet TAKE 1 TABLET (25 MG TOTAL) BY MOUTH 2 (TWO) TIMES DAILY WITH A MEAL. 180 tablet 3  . Cholecalciferol (VITAMIN D) 2000 units tablet Take 2,000 Units by mouth daily.    . Cyanocobalamin (VITAMIN B-12 PO) Take 2,500 mcg by mouth daily.     [name] dexlansoprazole (DEXILANT) 60 MG capsule Take 1 capsule (60 mg total) by mouth daily. 30 capsule 5  . diltiazem (CARDIZEM CD) 120 MG 24 hr capsule TAKE 1 CAPSULE BY MOUTH EVERY DAY (FOR FUTURE REFILLS MAKE A DOCTORS VISIT) (Patient taking differently: Take 120 mg by mouth daily. ) 90 capsule 3  . famotidine (PEPCID) 20 MG tablet Take 1 tablet (20 mg total) by mouth at bedtime. 30 tablet 3  . fenofibrate 160 MG tablet TAKE 1 TABLET BY MOUTH EVERY DAY 90 tablet 0  . fluticasone (FLONASE) 50 MCG/ACT nasal spray PLACE 2 SPRAYS INTO BOTH NOSTRILS DAILY AS NEEDED FOR ALLERGIES 48 g 1  . fluticasone furoate-vilanterol (BREO ELLIPTA) 200-25  MCG/INH AEPB Inhale 1 puff into the lungs  daily. 60 each 3  . furosemide (LASIX) 40 MG tablet TAKE 1 TABLET BY MOUTH TWICE A DAY 180 tablet 3  . gabapentin (NEURONTIN) 100 MG capsule TAKE 1 CAPSULE BY MOUTH THREE TIMES A DAY 270 capsule 3  . Insulin Pen Needle 33G X 4 MM MISC 1 each by Does not apply route 4 (four) times daily. 200 each 11  . ipratropium (ATROVENT) 0.06 % nasal spray Place 2 sprays into both nostrils 4 (four) times daily. (Patient taking differently: Place 2 sprays into both nostrils 2 (two) times daily as needed for rhinitis. ) 15 mL 0  . isosorbide mononitrate (IMDUR) 30 MG 24 hr tablet Take 1 tablet (30 mg total) by mouth at bedtime. 90 tablet 3  . KLOR-CON M20 20 MEQ tablet Take 30 mEq by mouth 2 (two) times daily.   2  . linaclotide (LINZESS) 290 MCG CAPS capsule Take 1 capsule (290 mcg total) by mouth daily before breakfast. (Patient taking differently: Take 290 mcg by mouth every other day. ) 30 capsule 11  . LORazepam (ATIVAN) 0.5 MG tablet Take 0.5 mg by mouth every 12 (twelve) hours as needed for anxiety.    . metFORMIN (GLUCOPHAGE) 1000 MG tablet TAKE 1 TABLET BY MOUTH TWICE A DAY WITH MEALS 180 tablet 0  . naproxen (NAPROSYN) 500 MG tablet Take 1 tablet (500 mg total) by mouth 2 (two) times daily with a meal. 60 tablet 1  . nitroGLYCERIN (NITROSTAT) 0.4 MG SL tablet Place 1 tablet (0.4 mg total) under the tongue every 5 (five) minutes as needed for chest pain. Reported on 01/05/2016 25 tablet 6  . ondansetron (ZOFRAN-ODT) 4 MG disintegrating tablet Take 1 tablet (4 mg total) by mouth every 8 (eight) hours as needed for nausea or vomiting. 50 tablet 2  . oxyCODONE-acetaminophen (PERCOCET) 5-325 MG tablet Take 1 tablet by mouth every 6 (six) hours as needed for severe pain. 30 tablet 0  . oxyCODONE-acetaminophen (PERCOCET/ROXICET) 5-325 MG tablet Take 1 tablet by mouth every 8 (eight) hours as needed. 20 tablet 0  . PARoxetine Mesylate (BRISDELLE) 7.5 MG CAPS Take 7.5 mg by mouth daily.     . predniSONE (DELTASONE)  20 MG tablet Take 2 pills for 5 days 10 tablet 0  . sulfamethoxazole-trimethoprim (BACTRIM DS,SEPTRA DS) 800-160 MG tablet Take 1 tablet by mouth 2 (two) times daily. 14 tablet 0  . telmisartan (MICARDIS) 20 MG tablet TAKE 1 TABLET BY MOUTH EVERY DAY 90 tablet 1  . triamcinolone cream (KENALOG) 0.1 % Apply 1 application topically 2 (two) times daily as needed (for rash. stop after 7-10 days.). 80 g 0  . venlafaxine XR (EFFEXOR-XR) 75 MG 24 hr capsule Take 1 capsule (75 mg total) by mouth 2 (two) times daily. 180 capsule 3  . [DISCONTINUED] mupirocin nasal ointment (BACTROBAN) 2 % Place 1 application into the nose 2 (two) times daily. Use one-half of tube in each nostril twice daily for five (5) days. After application, press sides of nose together and gently massage. 10 g 0   No current facility-administered medications on file prior to visit.     Allergies  Allergen Reactions  . Methotrexate Other (See Comments)    Peri-oral and buccal lesions  . Vancomycin Other (See Comments)    DOSE RELATED NEPHROTOXICITY  . Lisinopril Cough  . Chlorhexidine Itching  . Clindamycin/Lincomycin Nausea And Vomiting and Rash  . Doxycycline Rash  . Teflaro [Ceftaroline] Rash and  Other (See Comments)    Tolerates ceftriaxone     Family History  Problem Relation Age of Onset  . Diabetes Father   . Heart attack Father 60  . Coronary artery disease Father   . Heart failure Father   . Throat cancer Father   . COPD Mother   . Emphysema Mother   . Asthma Mother   . Heart failure Mother   . Breast cancer Mother   . Heart attack Maternal Grandfather   . Sarcoidosis Maternal Uncle   . Lung cancer Brother   . Diabetes Brother   . Colon cancer Neg Hx   . Rectal cancer Neg Hx      Review of Systems She denies hypoglycemia    Objective:   Physical Exam  Lab Results  Component Value Date   CREATININE 1.16 12/04/2018   BUN 23 12/04/2018   NA 138 12/04/2018   K 4.3 12/04/2018   CL 100 12/04/2018    CO2 30 12/04/2018       Assessment & Plan:  Insulin-requiring type 2 DM, with PN: she needs increased rx Renal insuff: she may need a faster-acting QAM insulin, but we'll first increase current insulin. AB: steroids rx is affecting glycemic control.    Patient Instructions  Please increase the basaglar to 50 units daily.  It is better to take it in the morning. It is also better to take the Novolog with supper.  Our goal is to minimize the as-needed (just 2 units for a blood sugar over 300), except when you are on steroids. check your blood sugar twice a day.  vary the time of day when you check, between before the 3 meals, and at bedtime.  also check if you have symptoms of your blood sugar being too high or too low.  please keep a record of the readings and bring it to your next appointment here (or you can bring the meter itself).  You can write it on any piece of paper.  please call us sooner if your blood sugar goes below 70, or if you have a lot of readings over 200. Please come back for a follow-up appointment in 2 months.

## 2019-02-12 NOTE — Patient Instructions (Addendum)
Please increase the basaglar to 50 units daily.  It is better to take it in the morning. It is also better to take the Novolog with supper.  Our goal is to minimize the as-needed (just 2 units for a blood sugar over 300), except when you are on steroids. check your blood sugar twice a day.  vary the time of day when you check, between before the 3 meals, and at bedtime.  also check if you have symptoms of your blood sugar being too high or too low.  please keep a record of the readings and bring it to your next appointment here (or you can bring the meter itself).  You can write it on any piece of paper.  please call us sooner if your blood sugar goes below 70, or if you have a lot of readings over 200. Please come back for a follow-up appointment in 2 months.

## 2019-02-14 ENCOUNTER — Other Ambulatory Visit: Payer: Self-pay

## 2019-02-14 DIAGNOSIS — Z794 Long term (current) use of insulin: Secondary | ICD-10-CM

## 2019-02-14 DIAGNOSIS — E1129 Type 2 diabetes mellitus with other diabetic kidney complication: Secondary | ICD-10-CM

## 2019-02-14 MED ORDER — ONETOUCH DELICA LANCETS 33G MISC: 1.0000 | Freq: Two times a day (BID) | 2 refills | Status: DC

## 2019-02-16 ENCOUNTER — Telehealth: Payer: Self-pay | Admitting: *Deleted

## 2019-02-16 ENCOUNTER — Telehealth: Payer: Self-pay | Admitting: Endocrinology

## 2019-02-16 ENCOUNTER — Encounter: Payer: Self-pay | Admitting: Cardiology

## 2019-02-16 ENCOUNTER — Encounter: Payer: Self-pay | Admitting: *Deleted

## 2019-02-16 NOTE — Telephone Encounter (Signed)
DOS 03/01/2019, CPT CODES - 70964 X 4 AND 38381 X 4 - HAMMER TOE REPAIR X 4 AND CAPSULOTOMY X 4 RIGHT FOOT  [employer]: Active Coverage: 10/11/2018 - 10/11/2019  Individual In-Network (Calendar Year) Deductible $949.13 MET YTD  $1,550.87 remaining  $2,500.00 Plan Amt .  Out-of-Pocket $2,069.39 MET YTD  $4,280.61 remaining  $6,350.00 Plan Amt.  Physician Fees for Surgical and Medical Services 30% co-insurance, after you pay the deductible    The notification/prior authorization case information was transmitted on 02/16/2019 at 12:55 PM CDT. The notification/prior authorization reference number is [id number]. Please print this page for your records.

## 2019-02-16 NOTE — Telephone Encounter (Signed)
Her surgical risk is low and outweighed by the potential benefit of the surgery.  She is therefore medically cleared.  Please note this clearance is from the standpoint of the diabetes only. Also, pt should skip insulin the morning of the surgery.  She should resume usual when she is eating again.

## 2019-02-16 NOTE — Progress Notes (Signed)
Upon Dr. [name] Hunter's recommendation, Dr. Amalia Hailey had me to request surgical clearance from Drs. Campbell Riches, and [place].

## 2019-02-17 ENCOUNTER — Other Ambulatory Visit: Payer: Self-pay | Admitting: Family Medicine

## 2019-02-19 NOTE — Telephone Encounter (Signed)
"  The authorization was denied.  If you want to pursue, you will need to do an appeal."  We did an appeal previously and it was approved.  We had to cancel it due to the [facility] Virus.  "You will need to call [facility] and ask them to change the authorization to the new date of service."    I called [facility] and I spoke to [place].  I informed her about the appeal and the previous authorization for February 08, 2019.  She changed the authorization date from 02/08/2019 to 03/01/2019.  The authorization number is [id number]. The reference number for the call is [id number].

## 2019-02-19 NOTE — Telephone Encounter (Signed)
Rx refilled. Pt has been scheduled for f/u visit HTN

## 2019-02-20 ENCOUNTER — Ambulatory Visit (INDEPENDENT_AMBULATORY_CARE_PROVIDER_SITE_OTHER): Payer: 59 | Admitting: Family Medicine

## 2019-02-20 ENCOUNTER — Telehealth: Payer: Self-pay

## 2019-02-20 ENCOUNTER — Encounter: Payer: Self-pay | Admitting: Family Medicine

## 2019-02-20 ENCOUNTER — Telehealth: Payer: Self-pay | Admitting: *Deleted

## 2019-02-20 VITALS — BP 143/65 | HR 77 | Temp 97.0°F | Ht 66.0 in | Wt 255.0 lb

## 2019-02-20 DIAGNOSIS — E249 Cushing's syndrome, unspecified: Secondary | ICD-10-CM | POA: Diagnosis not present

## 2019-02-20 DIAGNOSIS — K219 Gastro-esophageal reflux disease without esophagitis: Secondary | ICD-10-CM | POA: Diagnosis not present

## 2019-02-20 DIAGNOSIS — N183 Chronic kidney disease, stage 3 unspecified: Secondary | ICD-10-CM

## 2019-02-20 DIAGNOSIS — F3342 Major depressive disorder, recurrent, in full remission: Secondary | ICD-10-CM | POA: Diagnosis not present

## 2019-02-20 DIAGNOSIS — E1169 Type 2 diabetes mellitus with other specified complication: Secondary | ICD-10-CM | POA: Diagnosis not present

## 2019-02-20 DIAGNOSIS — E785 Hyperlipidemia, unspecified: Secondary | ICD-10-CM

## 2019-02-20 DIAGNOSIS — M1A4720 Other secondary chronic gout, left ankle and foot, without tophus (tophi): Secondary | ICD-10-CM

## 2019-02-20 DIAGNOSIS — E1140 Type 2 diabetes mellitus with diabetic neuropathy, unspecified: Secondary | ICD-10-CM | POA: Insufficient documentation

## 2019-02-20 DIAGNOSIS — E1142 Type 2 diabetes mellitus with diabetic polyneuropathy: Secondary | ICD-10-CM

## 2019-02-20 DIAGNOSIS — I110 Hypertensive heart disease with heart failure: Secondary | ICD-10-CM | POA: Diagnosis not present

## 2019-02-20 DIAGNOSIS — E1159 Type 2 diabetes mellitus with other circulatory complications: Secondary | ICD-10-CM | POA: Diagnosis not present

## 2019-02-20 DIAGNOSIS — I10 Essential (primary) hypertension: Secondary | ICD-10-CM

## 2019-02-20 DIAGNOSIS — I5032 Chronic diastolic (congestive) heart failure: Secondary | ICD-10-CM

## 2019-02-20 DIAGNOSIS — I152 Hypertension secondary to endocrine disorders: Secondary | ICD-10-CM

## 2019-02-20 DIAGNOSIS — E242 Drug-induced Cushing's syndrome: Secondary | ICD-10-CM

## 2019-02-20 NOTE — Assessment & Plan Note (Signed)
S:well controlled on dexilant 60mg  and pepcid. avod naproxen advised A/P: since she has been doing well- would be reasonable to trial off pepcid at this point- may restart if needed.

## 2019-02-20 NOTE — Telephone Encounter (Signed)
Dr. Ellyn Hack, Please comment on holding ASA for surgery. Nonobstructive disease on last cath.

## 2019-02-20 NOTE — Assessment & Plan Note (Signed)
S: finds gabapentin helpful at 100 mg TID A/P: Stable. Continue current medications.

## 2019-02-20 NOTE — Assessment & Plan Note (Signed)
S: Doing well-compliant with medications- Wellbutrin 300mg . Paroxetine 7.5mg -hot flashes. Effexor 75mg  BID A/P: phq9 controlled in march and she reports remains well controlled- continue wellbutrin 150 mg XR and effexor 75 mg XR

## 2019-02-20 NOTE — Telephone Encounter (Signed)
   [facility] Pre-operative Risk Assessment    Request for surgical clearance:  1. What type of surgery is being performed? Hammer Toe Repairs 2, 3, 4, 5 digits and Capsulotomy MPJ Release Joints 2, 3, 4, 5 of the left foot.  2. When is this surgery scheduled? 03/01/19   3. What type of clearance is required (medical clearance vs. Pharmacy clearance to hold med vs. Both)? Both  4. Are there any medications that need to be held prior to surgery and how long? ASA   5. Practice name and name of physician performing surgery? [facility]   6. What is your office phone number [phone]   7.   What is your office fax number [phone]  8.   Anesthesia type (None, local, MAC, general) ? General   Meryl Crutch 02/20/2019, 11:32 AM  _________________________________________________________________   (provider comments below)

## 2019-02-20 NOTE — Patient Instructions (Addendum)
There are no preventive care reminders to display for this patient.  Depression screen [facility] 2/9 01/04/2019 02/20/2018 06/16/2017  Decreased Interest 1 0 0  Down, Depressed, Hopeless 0 1 0  PHQ - 2 Score 1 1 0  Altered sleeping 1 1 -  Tired, decreased energy 1 1 -  Change in appetite 0 0 -  Feeling bad or failure about yourself  0 0 -  Trouble concentrating 0 0 -  Moving slowly or fidgety/restless 0 0 -  Suicidal thoughts 0 0 -  PHQ-9 Score 3 3 -  Difficult doing work/chores Somewhat difficult Somewhat difficult -  Some recent data might be hidden   Video visit

## 2019-02-20 NOTE — Telephone Encounter (Signed)
Yes -OK to hold. [name], MD

## 2019-02-20 NOTE — Progress Notes (Signed)
Phone [phone]   Subjective:  Virtual visit via Video note. Chief complaint: Chief Complaint  Patient presents with  . Hypertension   This visit type was conducted due to national recommendations for restrictions regarding the COVID-19 Pandemic (e.g. social distancing).  This format is felt to be most appropriate for this patient at this time balancing risks to patient and risks to population by having him in for in person visit.  No physical exam was performed (except for noted visual exam or audio findings with Telehealth visits).    Our team/I connected with [name] on 02/20/19 at  1:00 PM EDT by a video enabled telemedicine application ([email or web address]) and verified that I am speaking with the correct person using two identifiers.  Location patient: [place] Location provider: [facility], office Persons participating in the virtual visit:  patient  Our team/I discussed the limitations of evaluation and management by telemedicine and the availability of in person appointments. In light of current covid-19 pandemic, patient also understands that we are trying to protect them by minimizing in office contact if at all possible.  The patient expressed consent for telemedicine visit and agreed to proceed. Patient understands insurance will be billed.   ROS- No fever, chills, new cough, new shortness of breath, body aches, sore throat, or loss of taste or smell  Past Medical History-  Patient Active Problem List   Diagnosis Date Noted  . Constipation 04/21/2018    Priority: High  . Obstructive chronic bronchitis without exacerbation ([place]) 09/18/2013    Priority: High  . Chest pain 04/11/2013    Priority: High  . Hypertensive heart disease with chronic diastolic congestive heart failure ([place])     Priority: High  . Sarcoidosis of lung ([place]) 04/10/2007    Priority: High  . Type II diabetes mellitus with renal manifestations ([place]) 08/21/2006    Priority: High  . Epiglottitis    Priority: Medium  . Osteoarthritis of right knee 01/26/2017    Priority: Medium  . CKD (chronic kidney disease) stage 3, GFR 30-59 ml/min (HCC) 04/27/2015    Priority: Medium  . History of osteomyelitis 03/27/2015    Priority: Medium  . Fatty liver 09/30/2014    Priority: Medium  . Depression     Priority: Medium  . Gout 08/20/2010    Priority: Medium  . Anemia 09/18/2009    Priority: Medium  . Sleep apnea 04/21/2009    Priority: Medium  . Hyperlipidemia associated with type 2 diabetes mellitus ([place]) 08/21/2006    Priority: Medium  . Hypertension associated with diabetes ([place]) 08/21/2006    Priority: Medium  . Total knee replacement status, left 12/07/2017    Priority: Low  . Dysphagia 09/20/2017    Priority: Low  . Plantar fasciitis, right 07/13/2017    Priority: Low  . S/P total knee arthroplasty, right 06/29/2017    Priority: Low  . Osteoarthrosis, localized, primary, knee, right     Priority: Low  . Sprain of calcaneofibular ligament of right ankle 06/06/2017    Priority: Low  . Onychomycosis 10/27/2015    Priority: Low  . MRSA (methicillin resistant staph aureus) culture positive 03/27/2015    Priority: Low  . Hot flashes 07/15/2014    Priority: Low  . Abnormal SPEP 04/17/2014    Priority: Low  . Fracture of left leg 04/17/2014    Priority: Low  . Cushingoid side effect of steroids ([place]) 04/17/2014    Priority: Low  . Internal hemorrhoids     Priority: Low  .  Preoperative clearance 03/25/2014    Priority: Low  . Solitary pulmonary nodule, on CT 02/2013 - stable over 2 years in 2015 02/20/2013    Priority: Low  . Morbid obesity ([place]) 06/04/2009    Priority: Low  . GERD 08/21/2006    Priority: Low  . Diabetic neuropathy ([place]) 02/20/2019  . Severe persistent asthma 01/04/2019  . Subcortical microvascular ischemic occlusive disease 07/12/2018  . Rapid palpitations 07/12/2018  . Impingement of left ankle joint 06/26/2018  . Anxiety 04/05/2018     Medications- reviewed and updated Current Outpatient Medications  Medication Sig Dispense Refill  . albuterol (PROVENTIL HFA;VENTOLIN HFA) 108 (90 Base) MCG/ACT inhaler Inhale 1-2 puffs into the lungs every 6 (six) hours as needed for wheezing or shortness of breath. 8 g 5  . allopurinol (ZYLOPRIM) 100 MG tablet Take 100 mg by mouth 2 (two) times daily.     [name] aspirin EC 81 MG tablet Take 81 mg by mouth daily.     [name] atorvastatin (LIPITOR) 40 MG tablet TAKE 1 TAB BY MOUTH ONCE DAILY 90 tablet 3  . buPROPion (WELLBUTRIN XL) 300 MG 24 hr tablet TAKE 1 TABLET BY MOUTH EVERY DAY 90 tablet 3  . Calcium Citrate 200 MG TABS Take 200 mg by mouth daily.    . carvedilol (COREG) 25 MG tablet TAKE 1 TABLET (25 MG TOTAL) BY MOUTH 2 (TWO) TIMES DAILY WITH A MEAL. 180 tablet 3  . Cholecalciferol (VITAMIN D) 2000 units tablet Take 2,000 Units by mouth daily.    . Cyanocobalamin (VITAMIN B-12 PO) Take 2,500 mcg by mouth daily.     [name] dexlansoprazole (DEXILANT) 60 MG capsule Take 1 capsule (60 mg total) by mouth daily. 30 capsule 5  . diltiazem (CARDIZEM CD) 120 MG 24 hr capsule TAKE 1 CAPSULE BY MOUTH EVERY DAY (FOR FUTURE REFILLS MAKE A DOCTORS VISIT) (Patient taking differently: Take 120 mg by mouth daily. ) 90 capsule 3  . famotidine (PEPCID) 20 MG tablet Take 1 tablet (20 mg total) by mouth at bedtime. 30 tablet 3  . fenofibrate 160 MG tablet TAKE 1 TABLET BY MOUTH EVERY DAY 90 tablet 0  . fluticasone (FLONASE) 50 MCG/ACT nasal spray PLACE 2 SPRAYS INTO BOTH NOSTRILS DAILY AS NEEDED FOR ALLERGIES 48 g 1  . fluticasone furoate-vilanterol (BREO ELLIPTA) 200-25 MCG/INH AEPB Inhale 1 puff into the lungs daily. 60 each 3  . furosemide (LASIX) 40 MG tablet TAKE 1 TABLET BY MOUTH TWICE A DAY 180 tablet 3  . gabapentin (NEURONTIN) 100 MG capsule TAKE 1 CAPSULE BY MOUTH THREE TIMES A DAY 270 capsule 3  . glucose blood (ONETOUCH VERIO) test strip 1 each by Other route 2 (two) times daily. Use as instructed 200 each 3  .  Insulin Glargine (BASAGLAR KWIKPEN) 100 UNIT/ML SOPN Inject 0.5 mLs (50 Units total) into the skin every morning. 5 pen 2  . insulin lispro (HUMALOG KWIKPEN) 100 UNIT/ML KwikPen Inject 0.3 mLs (30 Units total) into the skin daily with supper. Take PRN, for a total of 25 units per day 5 pen 11  . Insulin Pen Needle 33G X 4 MM MISC 1 each by Does not apply route 4 (four) times daily. 200 each 11  . KLOR-CON M20 20 MEQ tablet TAKE 1 & 1/2 TABLETS BY MOUTH TWICE A DAY 270 tablet 2  . linaclotide (LINZESS) 290 MCG CAPS capsule Take 1 capsule (290 mcg total) by mouth daily before breakfast. (Patient taking differently: Take 290 mcg by mouth every  other day. ) 30 capsule 11  . LORazepam (ATIVAN) 0.5 MG tablet Take 0.5 mg by mouth every 12 (twelve) hours as needed for anxiety.    . metFORMIN (GLUCOPHAGE) 1000 MG tablet TAKE 1 TABLET BY MOUTH TWICE A DAY WITH MEALS 180 tablet 0  . naproxen (NAPROSYN) 500 MG tablet Take 1 tablet (500 mg total) by mouth 2 (two) times daily with a meal. 60 tablet 1  . nitroGLYCERIN (NITROSTAT) 0.4 MG SL tablet Place 1 tablet (0.4 mg total) under the tongue every 5 (five) minutes as needed for chest pain. Reported on 01/05/2016 25 tablet 6  . ondansetron (ZOFRAN-ODT) 4 MG disintegrating tablet Take 1 tablet (4 mg total) by mouth every 8 (eight) hours as needed for nausea or vomiting. 50 tablet 2  . OneTouch Delica Lancets 09T MISC 1 each by Does not apply route 2 (two) times a day. E11.29 100 each 2  . oxyCODONE-acetaminophen (PERCOCET/ROXICET) 5-325 MG tablet Take 1 tablet by mouth every 8 (eight) hours as needed. 20 tablet 0  . PARoxetine Mesylate (BRISDELLE) 7.5 MG CAPS Take 7.5 mg by mouth daily.     . predniSONE (DELTASONE) 20 MG tablet Take 2 pills for 5 days 10 tablet 0  . sulfamethoxazole-trimethoprim (BACTRIM DS,SEPTRA DS) 800-160 MG tablet Take 1 tablet by mouth 2 (two) times daily. 14 tablet 0  . telmisartan (MICARDIS) 20 MG tablet TAKE 1 TABLET BY MOUTH EVERY DAY 90  tablet 1  . triamcinolone cream (KENALOG) 0.1 % Apply 1 application topically 2 (two) times daily as needed (for rash. stop after 7-10 days.). 80 g 0  . venlafaxine XR (EFFEXOR-XR) 75 MG 24 hr capsule Take 1 capsule (75 mg total) by mouth 2 (two) times daily. 180 capsule 3  . isosorbide mononitrate (IMDUR) 30 MG 24 hr tablet Take 1 tablet (30 mg total) by mouth at bedtime. 90 tablet 3   No current facility-administered medications for this visit.      Objective:  BP (!) 143/65 (BP Location: Left Arm, Patient Position: Sitting, Cuff Size: Normal)   Pulse 77   Temp (!) 97 F (36.1 C) (Oral)   Ht 5\' 6"  (1.676 m)   Wt 255 lb (115.7 kg)   SpO2 98%   BMI 41.16 kg/m  self reported vitals Gen: NAD, resting comfortably Lungs: nonlabored, normal respiratory rate  Skin: appears dry, no obvious rash [place] hump and moon face noted    Assessment and Plan   #hypertension/hypertensive heart disease with chronic diastolic CHF S: controlled on last visit on coreg 25mg  BID, Imdur 30 mg, diltiazem 120mg  XR (from Dr. Ellyn Hack), lasix 40mg  BID- also takes potassium 30 meq per day total. Also on temlisartan 20mg .  BP Readings from Last 3 Encounters:  02/20/19 (!) 143/65  12/13/18 (!) 130/56  12/04/18 110/60  A/P: poor control today but looks back on home cuff and #s have been more 120s 130s - monitor at home and will update me by mychart within a week- suspect will improve -For CHF continue Lasix  # Gout S:gout has been controleld recently on allopurinol 100mg  BID from orthopedics mur[facility]  Lab Results  Component Value Date   LABURIC 5.1 01/26/2017  A/P: controlled- I would be willing to refill allopurinol if needed . Consider uric acid next labs  #hyperlipidemia S:  controlled on atorvastatin 40mg  last visit Lab Results  Component Value Date   CHOL 163 03/10/2017   HDL 35 (L) 03/10/2017   LDLCALC 49 03/10/2017   LDLDIRECT  76.0 01/15/2016   TRIG 197 (H) 09/20/2017   CHOLHDL 4.7  03/10/2017   A/P: overdue for lipid panel- we will try to obtain at 3 month visit (hopefully in person).  Continue atorvastatin 40 mg for now.  Also on fenofibrate.   # Depression S: Doing well-compliant with medications- Wellbutrin 300mg . Paroxetine 7.5mg -hot flashes. Effexor 75mg  BID A/P: phq9 controlled in march and she reports remains well controlled- continue wellbutrin 150 mg XR and effexor 75 mg XR  # gerd S:well controlled on dexilant 60mg  and pepcid. avod naproxen advised A/P: since she has been doing well- would be reasonable to trial off pepcid at this point- may restart if needed.    #Diabetic neuropathy S: finds gabapentin helpful at 100 mg TID A/P: Stable. Continue current medications.   #Morbid obesity- we discussed need for weight loss-specifically she asked what she can do for fatty liver and we discussed weight loss, exercise, healthy eating is mainstay of treatment. Encouraged need for healthy eating, regular exercise, weight loss.    #Cushingoid side effects of steroids- these are noted on examination- buffalo home, moon face  Other notes: 1.  Back to 5 mg prednisone 2.  She states very sparing Ativan for anxiety  Plan for 3 month follow up hopefully in person-next labs needs uric acid, lipids, b12. Also cbc, cmp. A1c will be done in July with Dr. Loanne Drilling  Future Appointments  Date Time Provider [place]  03/15/2019  9:30 AM [facility] MM 3 [facility]  04/16/2019 11:00 AM Renato Shin, MD [facility] None  06/25/2019  9:20 AM Leonie Man, MD [facility]   Lab/Order associations: Other secondary chronic gout of left ankle without tophus  Recurrent major depressive disorder, in full remission (HCC)  Gastroesophageal reflux disease without esophagitis  Diabetic polyneuropathy associated with type 2 diabetes mellitus (HCC)  CKD (chronic kidney disease) stage 3, GFR 30-59 ml/min (HCC)  Return precautions advised.  Garret Reddish, MD

## 2019-02-20 NOTE — Telephone Encounter (Signed)
[name] sent me an email stating [name]'s surgery had to be canceled for 03/01/2019, see below.  PLEASE REVIEW AGAIN D/T COVID-19 RESTRICTIONS- LUNG ISSUES: Pt was scheduled 08/2018 but Dr. Laveda Abbe recommended "patient be done at the hospital d/t chest pain & shob with out exertion. Reports to cardiologist cant walk to mailbox without becoming short of breath. May would need monitoring that cant be done outpatient." Notified Dr. Stephenie Acres office and pt was cancelled- pt is now back on schedule w Dr. Rebekah Chesterfield and most recent cardiologist note & clearance is attached, office would like this patient to be reviewed again. PLEASE REVIEW! - [name] (11/06/2018 17:04) -- Cardiology follow-up complete: 30-day monitor, infrequent PVC/PAC's. Baseline breath shortness due to sarcoidosis/chronic bronchitis - stable. Anesthesia approved - Hall Busing (11/09/2018 16:05) - [name] (02/15/2019 09:59) -- Not approved for elective procedure at this time due to chronic pulmonary comorbidity. Should reschedule for later date once restrictions are eased. Hall Busing (02/16/2019 15:09) Waiting on okay from DM to notify pt/office/cs - [name] (02/16/2019 15:43) [name] by DM, left detailed message for patient, notified cs/office - [name] (02/20/2019 10:52)  Thanks,  [name] RN [facility] Pre Anesthesia Assessment Nurse

## 2019-02-20 NOTE — Telephone Encounter (Signed)
   Primary Cardiologist: [name], MD  Chart reviewed as part of pre-operative protocol coverage. Patient was contacted 02/20/2019 in reference to pre-operative risk assessment for pending surgery as outlined below.  [name] was last seen on 09/13/18 by Dr. Ellyn Hack.  Since that day, [name] has done well.  Her mobility is limited by her pulmonary issues secondary to sarcoidosis.  She denies any new cardiac symptoms, including angina.  Recent heart monitor negative for atrial arrhythmias.  Heart catheterization in 2018 with minimal nonobstructive disease.  Per Dr. Ellyn Hack: [name] to hold aspirin as needed for procedure.  Therefore, based on ACC/AHA guidelines, the patient would be at acceptable risk for the planned procedure without further cardiovascular testing.   I will route this recommendation to the requesting party via Epic fax function and remove from pre-op pool.  Please call with questions.  [place], PA 02/20/2019, 4:42 PM

## 2019-02-20 NOTE — Assessment & Plan Note (Signed)
S: We reviewed meaning of chronic kidney disease stage III today.  She should avoid NSAIDs.  She is on angiotensin receptor blocker in case proteinuric element A/P: Likely stable-we agreed to update BMP at follow-up in 3 months

## 2019-02-22 NOTE — Telephone Encounter (Signed)
I mailed the history and physical forms to the patient.

## 2019-02-22 NOTE — Telephone Encounter (Signed)
I am calling to let you know that we still have you scheduled for surgery on April 02, 2019 at [facility].  We still need you to get medical clearance from your Pulmonologist and from Dr. Loanne Drilling.  I sent them a medical clearance letter.  "I just had a virtual visit the other day with my lung doctor in [place].  I'll get him to send a clearance letter.  I'll call the other doctors too."  You're going to need another history and physical form completed by your primary care doctor within 30 days of the scheduled surgery date.  "Do I have to come by there to get the forms or can you send them to me?"  I'll mail them to you.

## 2019-02-28 ENCOUNTER — Other Ambulatory Visit: Payer: Self-pay | Admitting: Family

## 2019-02-28 ENCOUNTER — Telehealth: Payer: Self-pay | Admitting: *Deleted

## 2019-02-28 NOTE — Telephone Encounter (Signed)
"  Y'all got me to have surgery on the 22nd of June with Dr. Amalia Hailey.  I need to reschedule that because I have got to have shoulder surgery on the third of June.  I have arthritis in it and they have to go in and clean it out.  If you would, give me a call.  I'd sure appreciate it."

## 2019-03-01 NOTE — Telephone Encounter (Signed)
I called [name], surgery scheduler, and canceled [name]'s surgery for 04/02/2019 at [facility].  I am returning your call.  I got your message.  I have canceled your surgery.  "Do I just call when I want to reschedule?"  Yes, give [place] a call when you're feeling better and we can get you rescheduled for the surgery.

## 2019-03-09 NOTE — Pre-Procedure Instructions (Signed)
[name] [place], [place] [address] [place] [address] Phone: [phone] Fax: [phone]  [name] [place], [place] [name] [name] [address] Phone: [phone] Fax: [phone]  [place], [place] - [address] [place] [address] Phone: [phone] Fax: [phone]  [facility], [place] # [place] # [place] [address] Phone: [phone] Fax: [phone]  [facility] [place], [place] AT [place] [address] Phone: [phone] Fax: [phone]  [name] [place], [place] [address] [name] [address] Phone: [phone] Fax: [phone]      Your procedure is scheduled on Wednesday June 3rd.  Report to [facility] Main Entrance "A" at 6:30 A.M., and check in at the Admitting office.  Call this number if you have problems the morning of surgery:  [phone]  Call [phone] if you have any questions prior to your surgery date Monday-Friday 8am-4pm    Remember: Do not eat after midnight. You can have clear liquids until 5:30. Clear liquids allowed are: Water, Non-Citrus Juices (without pulp), Carbonated Beverages, Clear Tea, Black Coffee Only, and Gatorade. Your doctor has ordered for you to have a Pre-surgery G2 Gatorade. Please complete this by 5:30 AM.    Take these medicines the morning of surgery with A SIP OF WATER  albuterol (PROVENTIL HFA;VENTOLIN HFA) if needed *bring with you to the hospital on day of surgery buPROPion (WELLBUTRIN XL) carvedilol (COREG) dexlansoprazole (DEXILANT)  famotidine (PEPCID) fluticasone furoate-vilanterol (BREO ELLIPTA) gabapentin (NEURONTIN) PARoxetine Mesylate  (BRISDELLE) predniSONE (DELTASONE)  fluticasone (FLONASE) if needed LORazepam (ATIVAN) if needed ondansetron (ZOFRAN-ODT) if needed oxyCODONE-acetaminophen (PERCOCET/ROXICET) if needed   HOW TO MANAGE YOUR DIABETES BEFORE AND AFTER SURGERY  Why is it important to control my blood sugar before and after surgery? . Improving blood sugar levels before and after surgery helps healing and can limit problems. . A way of improving blood sugar control is eating a healthy diet by: o  Eating less sugar and carbohydrates o  Increasing activity/exercise o  Talking with your doctor about reaching your blood sugar goals . High blood sugars (greater than 180 mg/dL) can raise your risk of infections and slow your recovery, so you will need to focus on controlling your diabetes during the weeks before surgery. . Make sure that the doctor who takes care of your diabetes knows about your planned surgery including the date and location.  How do I manage my blood sugar before surgery? . Check your blood sugar at least 4 times a day, starting 2 days before surgery, to make sure that the level is not too high or low. o Check your blood sugar the morning of your surgery when you wake up and every 2 hours until you get to the Short Stay unit. . If your blood sugar is less than 70 mg/dL, you will need to treat for low blood sugar: o Do not take insulin. o Treat a low blood sugar (less than 70 mg/dL) with  cup of clear juice (cranberry or apple), 4 glucose tablets, OR glucose gel. Recheck blood sugar in 15 minutes after treatment (to make sure it is greater than 70 mg/dL). If your blood sugar is not greater  than 70 mg/dL on recheck, call [phone] o  for further instructions. . Report your blood sugar to the short stay nurse when you get to Short Stay.  . If you are admitted to the hospital after surgery: o Your blood sugar will be checked by the staff and you will probably be given insulin after surgery  (instead of oral diabetes medicines) to make sure you have good blood sugar levels. o The goal for blood sugar control after surgery is 80-180 mg/dL.     WHAT DO I DO ABOUT MY DIABETES MEDICATION?   [name] Do not take oral diabetes medicines (pills): metFORMIN (GLUCOPHAGE)  the morning of surgery.  . THE NIGHT BEFORE SURGERY, take 25 units of Insulin Glargine (BASAGLAR KWIKPEN) insulin.       . THE MORNING OF SURGERY do not take Insulin Glargine [facility])   . If your blood sugar is greater 220, you may take 1/2 your usual dose of sliding scale insulin.   7 days prior to surgery STOP taking any Aspirin (unless otherwise instructed by your surgeon), Aleve, Naproxen, Ibuprofen, Motrin, Advil, Goody's, BC's, all herbal medications, fish oil, and all vitamins.    The Morning of Surgery  Do not wear jewelry, make-up or nail polish.  Do not wear lotions, powders, or perfumes/colognes, or deodorant  Do not shave 48 hours prior to surgery.  Men may shave face and neck.  Do not bring valuables to the hospital.  [facility] is not responsible for any belongings or valuables.  If you are a smoker, DO NOT Smoke 24 hours prior to surgery IF you wear a CPAP at night please bring your mask, tubing, and machine the morning of surgery   Remember that you must have someone to transport you home after your surgery, and remain with you for 24 hours if you are discharged the same day.   Contacts, glasses, hearing aids, dentures or bridgework may not be worn into surgery.    Leave your suitcase in the car.  After surgery it may be brought to your room.  For patients admitted to the hospital, discharge time will be determined by your treatment team.  Patients discharged the day of surgery will not be allowed to drive home.    Special instructions:   [facility]- Preparing For Surgery  Before surgery, you can play an important role. Because skin is not sterile, your skin needs to be as free  of germs as possible. You can reduce the number of germs on your skin by washing with CHG (chlorahexidine gluconate) Soap before surgery.  CHG is an antiseptic cleaner which kills germs and bonds with the skin to continue killing germs even after washing.    Oral Hygiene is also important to reduce your risk of infection.  Remember - BRUSH YOUR TEETH THE MORNING OF SURGERY WITH YOUR REGULAR TOOTHPASTE  Please do not use if you have an allergy to CHG or antibacterial soaps. If your skin becomes reddened/irritated stop using the CHG.  Do not shave (including legs and underarms) for at least 48 hours prior to first CHG shower. It is OK to shave your face.  Please follow these instructions carefully.   1. Shower the NIGHT BEFORE SURGERY and the MORNING OF SURGERY with CHG Soap.   2. If you chose to wash your hair, wash your hair first as usual with your normal shampoo.  3. After you shampoo, rinse your hair and body thoroughly to remove the shampoo.  4.  Use CHG as you would any other liquid soap. You can apply CHG directly to the skin and wash gently with a scrungie or a clean washcloth.   5. Apply the CHG Soap to your body ONLY FROM THE NECK DOWN.  Do not use on open wounds or open sores. Avoid contact with your eyes, ears, mouth and genitals (private parts). Wash Face and genitals (private parts)  with your normal soap.   6. Wash thoroughly, paying special attention to the area where your surgery will be performed.  7. Thoroughly rinse your body with warm water from the neck down.  8. DO NOT shower/wash with your normal soap after using and rinsing off the CHG Soap.  9. Pat yourself dry with a CLEAN TOWEL.  10. Wear CLEAN PAJAMAS to bed the night before surgery, wear comfortable clothes the morning of surgery  11. Place CLEAN SHEETS on your bed the night of your first shower and DO NOT SLEEP WITH PETS.    Day of Surgery:  Do not apply any deodorants/lotions.  Please wear clean  clothes to the hospital/surgery center.   Remember to brush your teeth WITH YOUR REGULAR TOOTHPASTE.   Please read over the following fact sheets that you were given.

## 2019-03-12 ENCOUNTER — Other Ambulatory Visit: Payer: Self-pay

## 2019-03-12 ENCOUNTER — Encounter (HOSPITAL_COMMUNITY): Payer: Self-pay

## 2019-03-12 ENCOUNTER — Encounter (HOSPITAL_COMMUNITY)
Admission: RE | Admit: 2019-03-12 | Discharge: 2019-03-12 | Disposition: A | Discharge status: Home / Self Care | Payer: 59 | Source: Ambulatory Visit | Attending: Orthopedic Surgery | Admitting: Orthopedic Surgery

## 2019-03-12 ENCOUNTER — Other Ambulatory Visit (HOSPITAL_COMMUNITY)
Admission: RE | Admit: 2019-03-12 | Discharge: 2019-03-12 | Disposition: A | Discharge status: Home / Self Care | Payer: 59 | Source: Ambulatory Visit | Attending: Orthopedic Surgery | Admitting: Orthopedic Surgery

## 2019-03-12 DIAGNOSIS — D860 Sarcoidosis of lung: Secondary | ICD-10-CM | POA: Diagnosis not present

## 2019-03-12 DIAGNOSIS — Z01812 Encounter for preprocedural laboratory examination: Secondary | ICD-10-CM | POA: Insufficient documentation

## 2019-03-12 DIAGNOSIS — Z1159 Encounter for screening for other viral diseases: Secondary | ICD-10-CM | POA: Insufficient documentation

## 2019-03-12 DIAGNOSIS — Z79899 Other long term (current) drug therapy: Secondary | ICD-10-CM | POA: Diagnosis not present

## 2019-03-12 DIAGNOSIS — M75102 Unspecified rotator cuff tear or rupture of left shoulder, not specified as traumatic: Secondary | ICD-10-CM | POA: Diagnosis present

## 2019-03-12 DIAGNOSIS — J449 Chronic obstructive pulmonary disease, unspecified: Secondary | ICD-10-CM | POA: Diagnosis not present

## 2019-03-12 DIAGNOSIS — Z8614 Personal history of Methicillin resistant Staphylococcus aureus infection: Secondary | ICD-10-CM | POA: Diagnosis not present

## 2019-03-12 DIAGNOSIS — Z794 Long term (current) use of insulin: Secondary | ICD-10-CM | POA: Diagnosis not present

## 2019-03-12 DIAGNOSIS — E785 Hyperlipidemia, unspecified: Secondary | ICD-10-CM | POA: Diagnosis not present

## 2019-03-12 DIAGNOSIS — M109 Gout, unspecified: Secondary | ICD-10-CM | POA: Diagnosis not present

## 2019-03-12 DIAGNOSIS — F419 Anxiety disorder, unspecified: Secondary | ICD-10-CM | POA: Diagnosis not present

## 2019-03-12 DIAGNOSIS — Z7951 Long term (current) use of inhaled steroids: Secondary | ICD-10-CM | POA: Diagnosis not present

## 2019-03-12 DIAGNOSIS — K219 Gastro-esophageal reflux disease without esophagitis: Secondary | ICD-10-CM | POA: Diagnosis not present

## 2019-03-12 DIAGNOSIS — E1165 Type 2 diabetes mellitus with hyperglycemia: Secondary | ICD-10-CM | POA: Diagnosis not present

## 2019-03-12 DIAGNOSIS — M25812 Other specified joint disorders, left shoulder: Secondary | ICD-10-CM | POA: Diagnosis not present

## 2019-03-12 DIAGNOSIS — F329 Major depressive disorder, single episode, unspecified: Secondary | ICD-10-CM | POA: Diagnosis not present

## 2019-03-12 DIAGNOSIS — Z6841 Body Mass Index (BMI) 40.0 and over, adult: Secondary | ICD-10-CM | POA: Diagnosis not present

## 2019-03-12 DIAGNOSIS — I110 Hypertensive heart disease with heart failure: Secondary | ICD-10-CM | POA: Diagnosis not present

## 2019-03-12 DIAGNOSIS — Z7982 Long term (current) use of aspirin: Secondary | ICD-10-CM | POA: Diagnosis not present

## 2019-03-12 DIAGNOSIS — Z96653 Presence of artificial knee joint, bilateral: Secondary | ICD-10-CM | POA: Diagnosis not present

## 2019-03-12 DIAGNOSIS — Z7952 Long term (current) use of systemic steroids: Secondary | ICD-10-CM | POA: Diagnosis not present

## 2019-03-12 DIAGNOSIS — I5032 Chronic diastolic (congestive) heart failure: Secondary | ICD-10-CM | POA: Diagnosis not present

## 2019-03-12 DIAGNOSIS — G4730 Sleep apnea, unspecified: Secondary | ICD-10-CM | POA: Diagnosis not present

## 2019-03-12 LAB — BASIC METABOLIC PANEL
Anion gap: 12 (ref 5–15)
BUN: 18 mg/dL (ref 6–20)
CO2: 25 mmol/L (ref 22–32)
Calcium: 9.1 mg/dL (ref 8.9–10.3)
Chloride: 102 mmol/L (ref 98–111)
Creatinine, Ser: 1.05 mg/dL — ABNORMAL HIGH (ref 0.44–1.00)
GFR calc Af Amer: >60 mL/min (ref >60)
GFR calc non Af Amer: 58 mL/min — ABNORMAL LOW (ref >60)
Glucose, Bld: 182 mg/dL — ABNORMAL HIGH (ref 70–99)
Potassium: 4.2 mmol/L (ref 3.5–5.1)
Sodium: 139 mmol/L (ref 135–145)

## 2019-03-12 LAB — CBC
HCT: 33.1 % — ABNORMAL LOW (ref 36.0–46.0)
Hemoglobin: 10 g/dL — ABNORMAL LOW (ref 12.0–15.0)
MCH: 27.2 pg (ref 26.0–34.0)
MCHC: 30.2 g/dL (ref 30.0–36.0)
MCV: 89.9 fL (ref 80.0–100.0)
Platelets: 311 10*3/uL (ref 150–400)
RBC: 3.68 MIL/uL — ABNORMAL LOW (ref 3.87–5.11)
RDW: 14.8 % (ref 11.5–15.5)
WBC: 9.4 10*3/uL (ref 4.0–10.5)
nRBC: 0 % (ref 0.0–0.2)

## 2019-03-12 LAB — SARS CORONAVIRUS 2 BY RT PCR (HOSPITAL ORDER, PERFORMED IN ~~LOC~~ HOSPITAL LAB): SARS Coronavirus 2: NEGATIVE

## 2019-03-12 LAB — HEMOGLOBIN A1C
Hgb A1c MFr Bld: 8.2 % — ABNORMAL HIGH (ref 4.8–5.6)
Mean Plasma Glucose: 188.64 mg/dL

## 2019-03-12 LAB — SURGICAL PCR SCREEN
MRSA, PCR: NEGATIVE
Staphylococcus aureus: POSITIVE — AB

## 2019-03-12 LAB — GLUCOSE, CAPILLARY: Glucose-Capillary: 191 mg/dL — ABNORMAL HIGH (ref 70–99)

## 2019-03-12 NOTE — Progress Notes (Signed)
Your procedure is scheduled on Wednesday June 3rd.             Report to [facility] Main Entrance "A" at 6:30 A.M., and check in at the Admitting office.             Call this number if you have problems the morning of surgery:             [phone]  Call [phone] if you have any questions prior to your surgery date Monday-Friday 8am-4pm               Remember: Do not eat after midnight. You can have clear liquids until 5:30. Clear liquids allowed are: Water, Non-Citrus Juices (without pulp), Carbonated Beverages, Clear Tea, Black Coffee Only, and Gatorade. Your doctor has ordered for you to have a Pre-surgery G2 Gatorade. Please complete this by 5:30 AM.                          Take these medicines the morning of surgery with A SIP OF WATER  albuterol (PROVENTIL HFA;VENTOLIN HFA) if needed *bring with you to the hospital on day of surgery buPROPion (WELLBUTRIN XL) carvedilol (COREG) dexlansoprazole (DEXILANT)  famotidine (PEPCID) fluticasone furoate-vilanterol (BREO ELLIPTA) gabapentin (NEURONTIN) PARoxetine Mesylate (BRISDELLE) predniSONE (DELTASONE)  fluticasone (FLONASE) if needed LORazepam (ATIVAN) if needed ondansetron (ZOFRAN-ODT) if needed oxyCODONE-acetaminophen (PERCOCET/ROXICET) if needed   HOW TO MANAGE YOUR DIABETES BEFORE AND AFTER SURGERY  Why is it important to control my blood sugar before and after surgery?  Improving blood sugar levels before and after surgery helps healing and can limit problems.  A way of improving blood sugar control is eating a healthy diet by: ?  Eating less sugar and carbohydrates ?  Increasing activity/exercise ?  Talking with your doctor about reaching your blood sugar goals  High blood sugars (greater than 180 mg/dL) can raise your risk of infections and slow your recovery, so you will need to focus on controlling your diabetes during the weeks before surgery.  Make sure that the doctor who takes care of your  diabetes knows about your planned surgery including the date and location.  How do I manage my blood sugar before surgery?  Check your blood sugar at least 4 times a day, starting 2 days before surgery, to make sure that the level is not too high or low. ? Check your blood sugar the morning of your surgery when you wake up and every 2 hours until you get to the Short Stay unit.  If your blood sugar is less than 70 mg/dL, you will need to treat for low blood sugar: ? Do not take insulin. ? Treat a low blood sugar (less than 70 mg/dL) with  cup of clear juice (cranberry or apple), 4glucose tablets, OR glucose gel. Recheck blood sugar in 15 minutes after treatment (to make sure it is greater than 70 mg/dL). If your blood sugar is not greater than 70 mg/dL on recheck, call [phone] ?  for further instructions.  Report your blood sugar to the short stay nurse when you get to Short Stay.   If you are admitted to the hospital after surgery: ? Your blood sugar will be checked by the staff and you will probably be given insulin after surgery (instead of oral diabetes medicines) to make sure you have good blood sugar levels. ? The goal for blood sugar control after surgery is 80-180 mg/dL.    WHAT  DO I DO ABOUT MY DIABETES MEDICATION?    Do not take oral diabetes medicines (pills): metFORMIN (GLUCOPHAGE)  the morning of surgery.   THE NIGHT BEFORE SURGERY, take 30 units of Insulin Lispro With dinner.                                                  THE MORNING OF SURGERY take 25 units of Insulin Glargine (BASAGLAR KWIKPEN) insulin.  (1/2 of your normal dose)   If your blood sugar is greater 220, you may take 1/2 your usual dose of sliding scale insulin.   7 days prior to surgery STOP taking any Aspirin (unless otherwise instructed by your surgeon), Aleve, Naproxen, Ibuprofen, Motrin, Advil, Goody's, BC's, all herbal medications, fish oil, and all vitamins.                The Morning of Surgery             Do not wear jewelry, make-up or nail polish.             Do not wear lotions, powders, or perfumes/colognes, or deodorant             Do not shave 48 hours prior to surgery.  Men may shave face and neck.             Do not bring valuables to the hospital.             [facility] is not responsible for any belongings or valuables.  If you are a smoker, DO NOT Smoke 24 hours prior to surgery IF you wear a CPAP at night please bring your mask, tubing, and machine the morning of surgery   Remember that you must have someone to transport you home after your surgery, and remain with you for 24 hours if you are discharged the same day.   Contacts, glasses, hearing aids, dentures or bridgework may not be worn into surgery.    Leave your suitcase in the car.  After surgery it may be brought to your room.  For patients admitted to the hospital, discharge time will be determined by your treatment team.  Patients discharged the day of surgery will not be allowed to drive home.    Special instructions:   [place]- Preparing For Surgery  Before surgery, you can play an important role. Because skin is not sterile, your skin needs to be as free of germs as possible. You can reduce the number of germs on your skin by washing with CHG (chlorahexidine gluconate) Soap before surgery.  CHG is an antiseptic cleaner which kills germs and bonds with the skin to continue killing germs even after washing.    Oral Hygiene is also important to reduce your risk of infection.  Remember - BRUSH YOUR TEETH THE MORNING OF SURGERY WITH YOUR REGULAR TOOTHPASTE  Please do not use if you have an allergy to CHG or antibacterial soaps. If your skin becomes reddened/irritated stop using the CHG.  Do not shave (including legs and underarms) for at least 48 hours prior to first CHG shower. It is OK to shave your face.  Please follow these instructions carefully.  1. Shower the NIGHT BEFORE SURGERY and the MORNING OF SURGERY with CHG Soap.   2. If you chose to wash your hair, wash your hair first as usual with your normal shampoo.  3. After you shampoo, rinse your hair and body thoroughly to remove the shampoo.  4. Use CHG as you would any other liquid soap. You can apply CHG directly to the skin and wash gently with a scrungie or a clean washcloth.   5. Apply the CHG Soap to your body ONLY FROM THE NECK DOWN.  Do not use on open wounds or open sores. Avoid contact with your eyes, ears, mouth and genitals (private parts). Wash Face and genitals (private parts)  with your normal soap.   6. Wash thoroughly, paying special attention to the area where your surgery will be performed.  7. Thoroughly rinse your body with warm water from the neck down.  8. DO NOT shower/wash with your normal soap after using and rinsing off the CHG Soap.  9. Pat yourself dry with a CLEAN TOWEL.  10. Wear CLEAN PAJAMAS to bed the night before surgery, wear comfortable clothes the morning of surgery  11. Place CLEAN SHEETS on your bed the night of your first shower and DO NOT SLEEP WITH PETS.    Day of Surgery:  Do not apply any deodorants/lotions.  Please wear clean clothes to the hospital/surgery center.   Remember to brush your teeth WITH YOUR REGULAR TOOTHPASTE.   Please read over the following fact sheets that you were given.

## 2019-03-12 NOTE — Anesthesia Preprocedure Evaluation (Addendum)
Anesthesia Evaluation  Patient identified by MRN, date of birth, ID band Patient awake    Reviewed: Allergy & Precautions, NPO status , Patient's Chart, lab work & pertinent test results  History of Anesthesia Complications Negative for: history of anesthetic complications  Airway Mallampati: II  TM Distance: >3 FB Neck ROM: Full    Dental  (+) Upper Dentures, Lower Dentures   Pulmonary asthma , sleep apnea , COPD,  Pulmonary sarcoidosis on chronic prednisone 5 mg daily   Pulmonary exam normal        Cardiovascular hypertension, Pt. on home beta blockers and Pt. on medications +CHF  Normal cardiovascular exam     Neuro/Psych PSYCHIATRIC DISORDERS Anxiety Depression negative neurological ROS     GI/Hepatic Neg liver ROS, GERD  ,  Endo/Other  diabetes, Poorly Controlled, Insulin DependentMorbid obesity  Renal/GU Renal InsufficiencyRenal disease  negative genitourinary   Musculoskeletal  (+) Arthritis ,   Abdominal   Peds  Hematology  (+) anemia ,   Anesthesia Other Findings H/o epiglottitis which required urgent awake intubation. Now resolved. Has followed up with ENT after discharge and had no concerning findings.  Reproductive/Obstetrics                           Anesthesia Physical Anesthesia Plan  ASA: III  Anesthesia Plan: General   Post-op Pain Management: GA combined w/ Regional for post-op pain   Induction: Intravenous  PONV Risk Score and Plan: 3 and Ondansetron, Dexamethasone, Midazolam and Treatment may vary due to age or medical condition  Airway Management Planned: Oral ETT and Video Laryngoscope Planned  Additional Equipment: None  Intra-op Plan:   Post-operative Plan: Extubation in OR  Informed Consent: I have reviewed the patients History and Physical, chart, labs and discussed the procedure including the risks, benefits and alternatives for the proposed  anesthesia with the patient or authorized representative who has indicated his/her understanding and acceptance.     Dental advisory given  Plan Discussed with:   Anesthesia Plan Comments: (PAT note written 03/12/2019 by Myra Gianotti, PA-C. )      Anesthesia Quick Evaluation

## 2019-03-12 NOTE — Progress Notes (Signed)
Anesthesia Chart Review:  Case:  [id number] Date/Time:  03/14/19 0815   Procedure:  LEFT SHOULDER ARTHROSCOPY, DEBRIDEMENT, AND DECOMPRESSION (Left )   Anesthesia type:  Choice   Pre-op diagnosis:  Rotator Cuff Tear Left Shoulder   Location:  [facility] OR ROOM 03 / [place] [facility]   Surgeon:  Newt Minion, MD      DISCUSSION: Patient is a 58 year old female scheduled for the above procedure.  History includes never smoker, chronic diastolic CHF, HTN, DM2, hyperlipidemia, COPD, pulmonary sarcoidosis, vocal cord dysfunction, anemia, GERD, epiglottitis (09/2017, details below). S/p left hammer toe repair 10/02/18. S/p R TKA 06/29/17 and left TKA 12/07/17. BMI is consistent with morbid obesity.   She was scheduled for another hammer toe surgery with Hollie Salk, DPM on 04/02/19, but she is delaying this for shoulder surgery. She had received preoperative input from cardiology, endocrinology, and pulmonology specific to that surgery (see below).  - Hospitalized 12/11-12/18/18 for epiglottitis, supraglottitis, and acute hypoxemic respiratory failure. Presented with worsening sore throat x 2 days. Strep negative. She required awake fiberoptic intubation through right nare with 6.5 ETT. She remained intubated until 09/23/17 when she was taken back to the operating room and extubated under TFl examination over a Cook catheter. She did well after extubation and was transferred to a step down unit on 09/24/17.  Based on currently available information, I would anticipate that she can proceed as planned if no acute changes. She is on prednisone 5 mg daily.    VS: BP (!) 142/58   Pulse 74   Temp (!) 36.4 C   Resp 20   Ht 5\' 6"  (1.676 m)   Wt 120.6 kg   SpO2 99%   BMI 42.92 kg/m    PROVIDERS: - [name], MD is PCP. Last visit 02/20/19.  [name], MD is cardiologist. Last visit 09/16/18 for follow-up and preoperative evaluation (hammer toe surgery). He felt she was stable from a cardiac and pulmonary  standpoint at that time, "low to moderate" risk. More recently, preoperative input for hammer toe surgery as outlined by Fabian Sharp, NP on 02/20/19: "[name] was last seen on 09/13/18 by Dr. Ellyn Hack.  Since that day, [name] has done well.  Her mobility is limited by her pulmonary issues secondary to sarcoidosis.  She denies any new cardiac symptoms, including angina.  Recent heart monitor negative for atrial arrhythmias.  Heart catheterization in 2018 with minimal nonobstructive disease. Per Dr. Ellyn Hack: [name] to hold aspirin as needed for procedure. Therefore, based on ACC/AHA guidelines, the patient would be at acceptable risk for the planned procedure without further cardiovascular testing."   - Renato Shin, MD is endocrinologist. Last visit 02/12/19. Preoperative input for hammer toe surgery as outlined on 02/16/19:  "Her surgical risk is low and outweighed by the potential benefit of the surgery. She is therefore medically cleared.  Please note this clearance is from the standpoint of the diabetes only. Also, pt should skip insulin the morning of the surgery.  She should resume usual when she is eating again."   - Loreta Ave, MD is pulmonologist ([facility]). Last visit 02/20/19. Severe asthma felt to have fair control on Breo and rescue inhalers. Still on steroids for Sarcoidosis. He cleared her for hammer toe surgery at that time. (She was also seen locally on 07/25/17 by Marshell Garfinkel, MD for preoperative evaluation.)   Lind Guest, MD is ENT ([place]). Supraglottitis and oropharyngeal dysphagia from 09/2017  felt related to infection and not mass/malignancy. Office transnasal fiberoptic laryngoscopy did not show any suspicious findings. PRN follow-up after 1/2/219 visit.   LABS: Preoperative labs reviewed. A1c 8.2, unchanged from 08/25/18. Reports fasting CBGs ~ 140-150's. (all labs ordered are listed, but only abnormal results are  displayed)  Labs Reviewed  SURGICAL PCR SCREEN - Abnormal; Notable for the following components:      Result Value   Staphylococcus aureus POSITIVE (*)    All other components within normal limits  GLUCOSE, CAPILLARY - Abnormal; Notable for the following components:   Glucose-Capillary 191 (*)    All other components within normal limits  BASIC METABOLIC PANEL - Abnormal; Notable for the following components:   Glucose, Bld 182 (*)    Creatinine, Ser 1.05 (*)    GFR calc non Af Amer 58 (*)    All other components within normal limits  CBC - Abnormal; Notable for the following components:   RBC 3.68 (*)    Hemoglobin 10.0 (*)    HCT 33.1 (*)    All other components within normal limits  HEMOGLOBIN A1C - Abnormal; Notable for the following components:   Hgb A1c MFr Bld 8.2 (*)    All other components within normal limits    Spirometry 04/15/17: FVC 2.2 (61%). FEV1 1.8 (64%). FEV1/FVC 81% (103%). FEF 25-75% 1.9 (73%). Moderate restriction.   IMAGES: CXR 08/23/18: IMPRESSION: No active cardiopulmonary disease.   EKG: 07/12/18: NSR, cannot rule out anterior infarct (age undetermined)   CV: Cardiac cath 03/11/17 (done for intermediate risk stress test): Conclusion: 1. Angiographically normal left main, RCA, and LCx 2. Minimal nonobstructive irregularity in the LAD 3. Normal LVEDP  Nuclear stress test 03/10/17: Impression: 1. There is a moderate-sized stress-induced perfusion defect at the apex as well as ill-defined stress-induced perfusion defect in the lateral. 2. Normal left ventricular wall motion. 3. Left ventricular ejection fraction 55% 4. Non invasive risk stratification: Intermediate  Echo 09/02/14: Study Conclusions: - Left ventricle: The cavity size was normal. Wall thickness wasincreased in a pattern of mild LVH. Systolic function was normal.The estimated ejection fraction was in the range of 55% to 60%.Wall motion was normal; there were no regional wall  motionabnormalities. Doppler parameters are consistent with abnormalleft ventricular relaxation (grade 1 diastolic dysfunction). - Aortic valve: There was no stenosis. - Mitral valve: Mildly calcified annulus. There was no significantregurgitation. - Left atrium: The atrium was mildly dilated. - Right ventricle: The cavity size was normal. Systolic functionwas normal. - Inferior vena cava: The vessel was normal in size. The respirophasic diameter changes were in the normal range (>= 50%),consistent with normal central venous pressure. Impressions: - Normal LV size with mild LV hypertrophy. EF 55-60%. Normal RV size and systolic function. No significant valvular abnormalities.   Past Medical History:  Diagnosis Date  . Abnormal SPEP 04/17/2014  . Acute left ankle pain 01/26/2017  . ANEMIA-UNSPECIFIED 09/18/2009  . Anxiety   . Arthritis   . Chronic diastolic heart failure, NYHA class 2 (HCC)    Normal LVEDP by May 2018  . COPD (chronic obstructive pulmonary disease) ([place])   . Depression   . DIABETES MELLITUS, TYPE II 08/21/2006  . Diabetic osteomyelitis ([place]) 05/29/2015  . Fracture of 5th metatarsal    non union  . GERD 01/00/7121   Had fundoplication  . GOUT 08/20/2010  . Hx of umbilical hernia repair   . HYPERLIPIDEMIA 08/21/2006  . HYPERTENSION 08/21/2006  . Infection of wound due to methicillin resistant Staphylococcus  aureus (MRSA)   . Internal hemorrhoids   . Multiple allergies 10/14/2016  . OBESITY 06/04/2009  . Onychomycosis 10/27/2015  . Osteomyelitis of left foot ([place]) 05/29/2015  . Pulmonary sarcoidosis ([place])    Followed locally by pulmonology, but also by Dr. Casper Harrison at [facility]  . Right knee pain 01/26/2017  . Vocal cord dysfunction   . Wears partial dentures     Past Surgical History:  Procedure Laterality Date  . ABDOMINAL HYSTERECTOMY    . APPENDECTOMY    . BLADDER SUSPENSION  11/11/2011   Procedure: TRANSVAGINAL TAPE (TVT) PROCEDURE;   Surgeon: Olga Millers, MD;  Location: [place] [facility];  Service: Gynecology;  Laterality: N/A;  . CAROTIDS  02/18/11   CAROTID DUPLEX; VERTEBRALS ARE PATENT WITH ANTEGRADE FLOW. ICA/CCA RATIO 1.61 ON RIGHT AND 0.75 ON LEFT  . CHOLECYSTECTOMY  1984  . CYSTOSCOPY  11/11/2011   Procedure: CYSTOSCOPY;  Surgeon: Olga Millers, MD;  Location: [place] [facility];  Service: Gynecology;  Laterality: N/A;  . EXTUBATION (ENDOTRACHEAL) IN OR N/A 09/23/2017   Procedure: EXTUBATION (ENDOTRACHEAL) IN OR;  Surgeon: Helayne Seminole, MD;  Location: [place];  Service: ENT;  Laterality: N/A;  . FIBEROPTIC LARYNGOSCOPY AND TRACHEOSCOPY N/A 09/23/2017   Procedure: FLEXIBLE FIBEROPTIC LARYNGOSCOPY;  Surgeon: Helayne Seminole, MD;  Location: [place];  Service: ENT;  Laterality: N/A;  . FRACTURE SURGERY     foot  . HALLUX FUSION Left 10/02/2018   Procedure: HALLUX ARTHRODESIS;  Surgeon: Edrick Kins, DPM;  Location: [facility];  Service: Podiatry;  Laterality: Left;  . HAMMER TOE SURGERY Left 10/02/2018   Procedure: HAMMER TOE CORRECTION 2ND 3RD 4RD FIFTH TOE;  Surgeon: Edrick Kins, DPM;  Location: [facility];  Service: Podiatry;  Laterality: Left;  . HERNIA REPAIR    . I&D EXTREMITY Left 06/27/2015   Procedure: Partial Excision Left Calcaneus, Place Antibiotic Beads, and Wound VAC;  Surgeon: Newt Minion, MD;  Location: [place];  Service: Orthopedics;  Laterality: Left;  . KNEE ARTHROSCOPY     right  . LEFT AND RIGHT HEART CATHETERIZATION WITH CORONARY ANGIOGRAM N/A 04/23/2013   Procedure: LEFT AND RIGHT HEART CATHETERIZATION WITH CORONARY ANGIOGRAM;  Surgeon: Leonie Man, MD;  Location: [facility];  Service: Cardiovascular;  Laterality: N/A;  . LEFT HEART CATH AND CORONARY ANGIOGRAPHY N/A 03/11/2017   Procedure: Left Heart Cath and Coronary Angiography;  Surgeon: Sherren Mocha, MD;  Location: [place] CV LAB; angiographically minimal CAD in the LAD otherwise normal.;  Normal LVEDP.  FALSE POSITIVE MYOVIEW  . LEFT HEART CATH  AND CORONARY ANGIOGRAPHY  07/20/2010   LVEF 50-55% WITH VERY MILD GLOBAL HYPOKINESIA; ESSENTIALLY NORMAL CORONARY ARTERIES; NORMAL LV FUNCTION  . METATARSAL OSTEOTOMY WITH OPEN REDUCTION INTERNAL FIXATION (ORIF) METATARSAL WITH FUSION Left 04/09/2014   Procedure: LEFT FOOT FRACTURE OPEN TREATMENT METATARSAL INCLUDES INTERNAL FIXATION EACH;  Surgeon: Lorn Junes, MD;  Location: [place];  Service: Orthopedics;  Laterality: Left;  . NISSEN FUNDOPLICATION  1950  . NM MYOVIEW LTD  03/09/2013   Lexiscan --> EF 50%; NORMAL MYOCARDIAL PERFUSION STUDY - breast attenuation  . NM MYOVIEW LTD  03/10/2017   : Moderate size "stress-induced "perfusion defect at the apex as well as "ill-defined stress-induced perfusion defect in the lateral wall.  EF 55%.  INTERMEDIATE risk. -->  FALSE POSITIVE  . Right and left CARDIAC CATHETERIZATION  04/23/2013   Angiographic normal coronaries; LVEDP 20 mmHg, PCWP 12-14 mmHg, RAP 12 mmHg.;  Fick CO/CI 4.9/2.2  . TOTAL KNEE ARTHROPLASTY Right 06/29/2017   Procedure: RIGHT TOTAL KNEE ARTHROPLASTY;  Surgeon: Newt Minion, MD;  Location: [place];  Service: Orthopedics;  Laterality: Right;  . TOTAL KNEE ARTHROPLASTY Left 12/07/2017  . TOTAL KNEE ARTHROPLASTY Left 12/07/2017   Procedure: LEFT TOTAL KNEE ARTHROPLASTY;  Surgeon: Newt Minion, MD;  Location: [name];  Service: Orthopedics;  Laterality: Left;  . TRACHEOSTOMY TUBE PLACEMENT N/A 09/20/2017   Procedure: AWAKE INTUBATION WITH ANESTHESIA WITH VIDEO ASSISTANCE;  Surgeon: Helayne Seminole, MD;  Location: [place] [facility];  Service: ENT;  Laterality: N/A;  . TRANSTHORACIC ECHOCARDIOGRAM  08/2014   Normal LV size and function.  Mild LVH.  EF 55-60%.  Normal regional wall motion.  GR 1 DD.  Normal RV size and function .  [name] TUBAL LIGATION     with reversal in 1994  . VENTRAL HERNIA REPAIR      MEDICATIONS: . albuterol (PROVENTIL HFA;VENTOLIN HFA) 108 (90 Base) MCG/ACT inhaler  . allopurinol (ZYLOPRIM) 100 MG  tablet  . aspirin EC 81 MG tablet  . atorvastatin (LIPITOR) 40 MG tablet  . buPROPion (WELLBUTRIN XL) 300 MG 24 hr tablet  . carvedilol (COREG) 25 MG tablet  . cholecalciferol (VITAMIN D) 25 MCG (1000 UT) tablet  . dexlansoprazole (DEXILANT) 60 MG capsule  . diltiazem (CARDIZEM CD) 120 MG 24 hr capsule  . famotidine (PEPCID) 20 MG tablet  . fenofibrate 160 MG tablet  . fluticasone (FLONASE) 50 MCG/ACT nasal spray  . fluticasone furoate-vilanterol (BREO ELLIPTA) 200-25 MCG/INH AEPB  . furosemide (LASIX) 40 MG tablet  . gabapentin (NEURONTIN) 100 MG capsule  . glucose blood (ONETOUCH VERIO) test strip  . Insulin Glargine (BASAGLAR KWIKPEN) 100 UNIT/ML SOPN  . insulin lispro (HUMALOG KWIKPEN) 100 UNIT/ML KwikPen  . Insulin Pen Needle 33G X 4 MM MISC  . isosorbide mononitrate (IMDUR) 30 MG 24 hr tablet  . KLOR-CON M20 20 MEQ tablet  . lactulose (CHRONULAC) 10 GM/15ML solution  . linaclotide (LINZESS) 290 MCG CAPS capsule  . LORazepam (ATIVAN) 0.5 MG tablet  . metFORMIN (GLUCOPHAGE) 1000 MG tablet  . naproxen (NAPROSYN) 500 MG tablet  . nitroGLYCERIN (NITROSTAT) 0.4 MG SL tablet  . ondansetron (ZOFRAN-ODT) 4 MG disintegrating tablet  . OneTouch Delica Lancets 70Y MISC  . oxyCODONE-acetaminophen (PERCOCET/ROXICET) 5-325 MG tablet  . PARoxetine Mesylate (BRISDELLE) 7.5 MG CAPS  . predniSONE (DELTASONE) 20 MG tablet  . predniSONE (DELTASONE) 5 MG tablet  . sulfamethoxazole-trimethoprim (BACTRIM DS,SEPTRA DS) 800-160 MG tablet  . telmisartan (MICARDIS) 20 MG tablet  . venlafaxine XR (EFFEXOR-XR) 75 MG 24 hr capsule   No current facility-administered medications for this encounter.     Myra Gianotti, PA-C Surgical Short Stay/Anesthesiology [facility] Phone [phone] [facility] Phone [phone] 03/12/2019 5:30 PM

## 2019-03-12 NOTE — Progress Notes (Addendum)
PCP - Garret Reddish, MD Cardiologist - [name], MD  Chest x-ray - 08/23/2018-in EPIC EKG - 07/12/2018- in EPIC  Stress Test - 02/2013 ECHO - 09/02/2014  Cardiac Cath - 03/11/2017- in EPIC   Sleep Study - No CPAP - n/a  Fasting Blood Sugar - 140s-150s Checks Blood Sugar 3 times a day  Blood Thinner Instructions: n/a Aspirin Instructions: Aspirin 81 mg holding 03/12/2019-pt will call office to confirm, note from cardiologist in [place] says ok to hold  Anesthesia review: yes, heart history  Patient denies shortness of breath, fever, cough and chest pain at PAT appointment  Patient verbalized understanding of instructions that were given to them at the PAT appointment. Patient was also instructed that they will need to review over the PAT instructions again at home before surgery.   Coronavirus Screening  Have you experienced the following symptoms:  Cough yes/no: No Fever (>100.21F)  yes/no: No Runny nose yes/no: No Sore throat yes/no: No Difficulty breathing/shortness of breath  yes/no: No  Have you or a family member traveled in the last 14 days and where? yes/no: No

## 2019-03-14 ENCOUNTER — Ambulatory Visit (HOSPITAL_COMMUNITY): Payer: 59 | Admitting: Anesthesiology

## 2019-03-14 ENCOUNTER — Encounter (HOSPITAL_COMMUNITY)
Admission: RE | Disposition: A | Discharge status: Home / Self Care | Payer: Self-pay | Source: Home / Self Care | Attending: Orthopedic Surgery

## 2019-03-14 ENCOUNTER — Ambulatory Visit (HOSPITAL_COMMUNITY)
Admission: RE | Admit: 2019-03-14 | Discharge: 2019-03-14 | Disposition: A | Discharge status: Home / Self Care | Payer: 59 | Attending: Orthopedic Surgery | Admitting: Orthopedic Surgery

## 2019-03-14 ENCOUNTER — Encounter (HOSPITAL_COMMUNITY): Payer: Self-pay | Admitting: *Deleted

## 2019-03-14 ENCOUNTER — Ambulatory Visit (HOSPITAL_COMMUNITY): Payer: 59 | Admitting: Vascular Surgery

## 2019-03-14 ENCOUNTER — Other Ambulatory Visit: Payer: Self-pay

## 2019-03-14 DIAGNOSIS — Z794 Long term (current) use of insulin: Secondary | ICD-10-CM | POA: Insufficient documentation

## 2019-03-14 DIAGNOSIS — M109 Gout, unspecified: Secondary | ICD-10-CM | POA: Insufficient documentation

## 2019-03-14 DIAGNOSIS — M7542 Impingement syndrome of left shoulder: Secondary | ICD-10-CM | POA: Diagnosis not present

## 2019-03-14 DIAGNOSIS — K219 Gastro-esophageal reflux disease without esophagitis: Secondary | ICD-10-CM | POA: Insufficient documentation

## 2019-03-14 DIAGNOSIS — Z7952 Long term (current) use of systemic steroids: Secondary | ICD-10-CM | POA: Insufficient documentation

## 2019-03-14 DIAGNOSIS — M75102 Unspecified rotator cuff tear or rupture of left shoulder, not specified as traumatic: Secondary | ICD-10-CM | POA: Insufficient documentation

## 2019-03-14 DIAGNOSIS — J449 Chronic obstructive pulmonary disease, unspecified: Secondary | ICD-10-CM | POA: Insufficient documentation

## 2019-03-14 DIAGNOSIS — M75122 Complete rotator cuff tear or rupture of left shoulder, not specified as traumatic: Secondary | ICD-10-CM

## 2019-03-14 DIAGNOSIS — E785 Hyperlipidemia, unspecified: Secondary | ICD-10-CM | POA: Diagnosis not present

## 2019-03-14 DIAGNOSIS — Z79899 Other long term (current) drug therapy: Secondary | ICD-10-CM | POA: Insufficient documentation

## 2019-03-14 DIAGNOSIS — Z7982 Long term (current) use of aspirin: Secondary | ICD-10-CM | POA: Insufficient documentation

## 2019-03-14 DIAGNOSIS — G8918 Other acute postprocedural pain: Secondary | ICD-10-CM | POA: Diagnosis not present

## 2019-03-14 DIAGNOSIS — F329 Major depressive disorder, single episode, unspecified: Secondary | ICD-10-CM | POA: Insufficient documentation

## 2019-03-14 DIAGNOSIS — I110 Hypertensive heart disease with heart failure: Secondary | ICD-10-CM | POA: Insufficient documentation

## 2019-03-14 DIAGNOSIS — Z6841 Body Mass Index (BMI) 40.0 and over, adult: Secondary | ICD-10-CM | POA: Insufficient documentation

## 2019-03-14 DIAGNOSIS — Z96653 Presence of artificial knee joint, bilateral: Secondary | ICD-10-CM | POA: Insufficient documentation

## 2019-03-14 DIAGNOSIS — Z8614 Personal history of Methicillin resistant Staphylococcus aureus infection: Secondary | ICD-10-CM | POA: Insufficient documentation

## 2019-03-14 DIAGNOSIS — Z7951 Long term (current) use of inhaled steroids: Secondary | ICD-10-CM | POA: Insufficient documentation

## 2019-03-14 DIAGNOSIS — G4730 Sleep apnea, unspecified: Secondary | ICD-10-CM | POA: Insufficient documentation

## 2019-03-14 DIAGNOSIS — M25812 Other specified joint disorders, left shoulder: Secondary | ICD-10-CM | POA: Insufficient documentation

## 2019-03-14 DIAGNOSIS — F419 Anxiety disorder, unspecified: Secondary | ICD-10-CM | POA: Insufficient documentation

## 2019-03-14 DIAGNOSIS — I5032 Chronic diastolic (congestive) heart failure: Secondary | ICD-10-CM | POA: Insufficient documentation

## 2019-03-14 DIAGNOSIS — E1165 Type 2 diabetes mellitus with hyperglycemia: Secondary | ICD-10-CM | POA: Insufficient documentation

## 2019-03-14 DIAGNOSIS — D860 Sarcoidosis of lung: Secondary | ICD-10-CM | POA: Insufficient documentation

## 2019-03-14 HISTORY — PX: SHOULDER ARTHROSCOPY: SHX128

## 2019-03-14 LAB — GLUCOSE, CAPILLARY
Glucose-Capillary: 120 mg/dL — ABNORMAL HIGH (ref 70–99)
Glucose-Capillary: 174 mg/dL — ABNORMAL HIGH (ref 70–99)

## 2019-03-14 SURGERY — ARTHROSCOPY, SHOULDER
Anesthesia: Regional | Laterality: Left

## 2019-03-14 MED ORDER — BUPIVACAINE HCL (PF) 0.25 % IJ SOLN
INTRAMUSCULAR | Status: AC
  Filled 2019-03-14: qty 30

## 2019-03-14 MED ORDER — ROCURONIUM BROMIDE 50 MG/5ML IV SOSY
PREFILLED_SYRINGE | INTRAVENOUS | Status: DC | PRN
  Administered 2019-03-14: 40 mg via INTRAVENOUS

## 2019-03-14 MED ORDER — EPHEDRINE SULFATE-NACL 50-0.9 MG/10ML-% IV SOSY
PREFILLED_SYRINGE | INTRAVENOUS | Status: DC | PRN
  Administered 2019-03-14 (×2): 10 mg via INTRAVENOUS

## 2019-03-14 MED ORDER — HYDROCODONE-ACETAMINOPHEN 5-325 MG PO TABS: 1.0000 | ORAL_TABLET | ORAL | 0 refills | Status: DC | PRN

## 2019-03-14 MED ORDER — DEXAMETHASONE SODIUM PHOSPHATE 10 MG/ML IJ SOLN
INTRAMUSCULAR | Status: AC
  Filled 2019-03-14: qty 1

## 2019-03-14 MED ORDER — FENTANYL CITRATE (PF) 100 MCG/2ML IJ SOLN
INTRAMUSCULAR | Status: DC | PRN
  Administered 2019-03-14: 50 ug via INTRAVENOUS

## 2019-03-14 MED ORDER — CEFAZOLIN SODIUM-DEXTROSE 2-4 GM/100ML-% IV SOLN
2.0000 g | INTRAVENOUS | Status: AC
  Administered 2019-03-14: 3 g via INTRAVENOUS

## 2019-03-14 MED ORDER — LACTATED RINGERS IV SOLN
INTRAVENOUS | Status: DC | PRN
  Administered 2019-03-14: 08:00:00 via INTRAVENOUS

## 2019-03-14 MED ORDER — ONDANSETRON HCL 4 MG/2ML IJ SOLN
INTRAMUSCULAR | Status: DC | PRN
  Administered 2019-03-14: 4 mg via INTRAVENOUS

## 2019-03-14 MED ORDER — FENTANYL CITRATE (PF) 100 MCG/2ML IJ SOLN
INTRAMUSCULAR | Status: AC
  Filled 2019-03-14: qty 2

## 2019-03-14 MED ORDER — POVIDONE-IODINE 10 % EX SWAB: 2.0000 "application " | Freq: Once | CUTANEOUS | Status: DC

## 2019-03-14 MED ORDER — SUCCINYLCHOLINE CHLORIDE 200 MG/10ML IV SOSY
PREFILLED_SYRINGE | INTRAVENOUS | Status: DC | PRN
  Administered 2019-03-14: 120 mg via INTRAVENOUS

## 2019-03-14 MED ORDER — PHENYLEPHRINE 40 MCG/ML (10ML) SYRINGE FOR IV PUSH (FOR BLOOD PRESSURE SUPPORT)
PREFILLED_SYRINGE | INTRAVENOUS | Status: AC
  Filled 2019-03-14: qty 10

## 2019-03-14 MED ORDER — LACTATED RINGERS IV SOLN
INTRAVENOUS | Status: DC
  Administered 2019-03-14: 08:00:00 via INTRAVENOUS

## 2019-03-14 MED ORDER — LIDOCAINE 2% (20 MG/ML) 5 ML SYRINGE
INTRAMUSCULAR | Status: AC
  Filled 2019-03-14: qty 5

## 2019-03-14 MED ORDER — MIDAZOLAM HCL 2 MG/2ML IJ SOLN
INTRAMUSCULAR | Status: AC
  Filled 2019-03-14: qty 2

## 2019-03-14 MED ORDER — PROPOFOL 10 MG/ML IV BOLUS
INTRAVENOUS | Status: AC
  Filled 2019-03-14: qty 20

## 2019-03-14 MED ORDER — CEFAZOLIN SODIUM-DEXTROSE 2-4 GM/100ML-% IV SOLN
INTRAVENOUS | Status: AC
  Filled 2019-03-14: qty 100

## 2019-03-14 MED ORDER — SODIUM CHLORIDE 0.9 % IV SOLN
INTRAVENOUS | Status: DC | PRN
  Administered 2019-03-14: 60 ug/min via INTRAVENOUS

## 2019-03-14 MED ORDER — SUGAMMADEX SODIUM 200 MG/2ML IV SOLN
INTRAVENOUS | Status: DC | PRN
  Administered 2019-03-14: 300 mg via INTRAVENOUS

## 2019-03-14 MED ORDER — PROPOFOL 10 MG/ML IV BOLUS
INTRAVENOUS | Status: DC | PRN
  Administered 2019-03-14: 200 mg via INTRAVENOUS

## 2019-03-14 MED ORDER — DEXAMETHASONE SODIUM PHOSPHATE 10 MG/ML IJ SOLN
INTRAMUSCULAR | Status: DC | PRN
  Administered 2019-03-14: 6 mg via INTRAVENOUS

## 2019-03-14 MED ORDER — MIDAZOLAM HCL 5 MG/5ML IJ SOLN
INTRAMUSCULAR | Status: DC | PRN
  Administered 2019-03-14 (×2): 1 mg via INTRAVENOUS

## 2019-03-14 MED ORDER — CEFAZOLIN SODIUM 1 G IJ SOLR
INTRAMUSCULAR | Status: AC
  Filled 2019-03-14: qty 10

## 2019-03-14 MED ORDER — ROCURONIUM BROMIDE 10 MG/ML (PF) SYRINGE
PREFILLED_SYRINGE | INTRAVENOUS | Status: AC
  Filled 2019-03-14: qty 10

## 2019-03-14 MED ORDER — ONDANSETRON HCL 4 MG/2ML IJ SOLN
INTRAMUSCULAR | Status: AC
  Filled 2019-03-14: qty 2

## 2019-03-14 MED ORDER — BUPIVACAINE HCL (PF) 0.5 % IJ SOLN
INTRAMUSCULAR | Status: DC | PRN
  Administered 2019-03-14: 15 mL via PERINEURAL

## 2019-03-14 MED ORDER — FENTANYL CITRATE (PF) 250 MCG/5ML IJ SOLN
INTRAMUSCULAR | Status: AC
  Filled 2019-03-14: qty 5

## 2019-03-14 MED ORDER — SUCCINYLCHOLINE CHLORIDE 200 MG/10ML IV SOSY
PREFILLED_SYRINGE | INTRAVENOUS | Status: AC
  Filled 2019-03-14: qty 10

## 2019-03-14 MED ORDER — BUPIVACAINE LIPOSOME 1.3 % IJ SUSP
INTRAMUSCULAR | Status: DC | PRN
  Administered 2019-03-14: 10 mL

## 2019-03-14 MED ORDER — LIDOCAINE 2% (20 MG/ML) 5 ML SYRINGE
INTRAMUSCULAR | Status: DC | PRN
  Administered 2019-03-14: 100 mg via INTRAVENOUS

## 2019-03-14 SURGICAL SUPPLY — 35 items
BLADE EXCALIBUR 4.0X13 (MISCELLANEOUS) ×2 IMPLANT
BLADE GREAT WHITE 4.2 (BLADE) ×2 IMPLANT
BUR OVAL 6.0 (BURR) ×2 IMPLANT
BURR OVAL 8 FLU 4.0X13 (MISCELLANEOUS) ×2 IMPLANT
CANNULA SHOULDER 7CM (CANNULA) ×2 IMPLANT
COVER SURGICAL LIGHT HANDLE (MISCELLANEOUS) ×4 IMPLANT
COVER WAND RF STERILE (DRAPES) ×2 IMPLANT
DRAPE STERI 35X30 U-POUCH (DRAPES) ×2 IMPLANT
DRAPE U-SHAPE 47X51 STRL (DRAPES) ×2 IMPLANT
DRSG ADAPTIC 3X8 NADH LF (GAUZE/BANDAGES/DRESSINGS) ×2 IMPLANT
DRSG EMULSION OIL 3X3 NADH (GAUZE/BANDAGES/DRESSINGS) ×2 IMPLANT
DRSG PAD ABDOMINAL 8X10 ST (GAUZE/BANDAGES/DRESSINGS) ×4 IMPLANT
DURAPREP 26ML APPLICATOR (WOUND CARE) ×2 IMPLANT
GAUZE SPONGE 4X4 12PLY STRL (GAUZE/BANDAGES/DRESSINGS) ×2 IMPLANT
GLOVE BIOGEL PI IND STRL 9 (GLOVE) ×1 IMPLANT
GLOVE BIOGEL PI INDICATOR 9 (GLOVE) ×1
GLOVE SURG ORTHO 9.0 STRL STRW (GLOVE) ×2 IMPLANT
GOWN STRL REUS W/ TWL XL LVL3 (GOWN DISPOSABLE) ×2 IMPLANT
GOWN STRL REUS W/TWL XL LVL3 (GOWN DISPOSABLE) ×4
KIT BASIN OR (CUSTOM PROCEDURE TRAY) ×2 IMPLANT
KIT TURNOVER KIT B (KITS) ×2 IMPLANT
MANIFOLD NEPTUNE II (INSTRUMENTS) ×2 IMPLANT
NEEDLE SPNL 18GX3.5 QUINCKE PK (NEEDLE) ×2 IMPLANT
NS IRRIG 1000ML POUR BTL (IV SOLUTION) ×2 IMPLANT
PACK SHOULDER (CUSTOM PROCEDURE TRAY) ×2 IMPLANT
PAD ABD 8X10 STRL (GAUZE/BANDAGES/DRESSINGS) ×2 IMPLANT
PAD ARMBOARD 7.5X6 YLW CONV (MISCELLANEOUS) ×4 IMPLANT
PROBE APOLLO 90XL (SURGICAL WAND) ×2 IMPLANT
SLING ARM IMMOBILIZER XL (CAST SUPPLIES) ×2 IMPLANT
SUT ETHILON 2 0 FS 18 (SUTURE) ×2 IMPLANT
TAPE CLOTH SURG 6X10 WHT LF (GAUZE/BANDAGES/DRESSINGS) ×2 IMPLANT
TOWEL OR 17X24 6PK STRL BLUE (TOWEL DISPOSABLE) ×4 IMPLANT
TUBING ARTHROSCOPY IRRIG 16FT (MISCELLANEOUS) ×2 IMPLANT
WAND STAR VAC 90 (SURGICAL WAND) IMPLANT
WATER STERILE IRR 1000ML POUR (IV SOLUTION) ×2 IMPLANT

## 2019-03-14 NOTE — Anesthesia Procedure Notes (Signed)
Anesthesia Regional Block: Interscalene brachial plexus block   Pre-Anesthetic Checklist: ,, timeout performed, Correct Patient, Correct Site, Correct Laterality, Correct Procedure, Correct Position, site marked, Risks and benefits discussed,  Surgical consent,  Pre-op evaluation,  At surgeon's request and post-op pain management  Laterality: Left  Prep: chloraprep       Needles:  Injection technique: Single-shot  Needle Type: Echogenic Stimulator Needle     Needle Length: 9cm  Needle Gauge: 21     Additional Needles:   Procedures:, nerve stimulator,,, ultrasound used (permanent image in chart),,,,   Nerve Stimulator or Paresthesia:  Response: biceps, 1 mA,   Additional Responses:   Narrative:  Start time: 03/14/2019 9:10 AM End time: 03/14/2019 9:14 AM Injection made incrementally with aspirations every 5 mL.  Performed by: Personally  Anesthesiologist: Lidia Collum, MD  Additional Notes: Monitors applied. Injection made in 5cc increments. No resistance to injection. Good needle visualization. Patient tolerated procedure well.

## 2019-03-14 NOTE — Op Note (Signed)
03/14/2019  10:48 AM  PATIENT:  [name]    PRE-OPERATIVE DIAGNOSIS:  Rotator Cuff Tear Left Shoulder with impingment  POST-OPERATIVE DIAGNOSIS:  Same  PROCEDURE:  LEFT SHOULDER ARTHROSCOPY, DEBRIDEMENT, AND DECOMPRESSION  SURGEON:  Newt Minion, MD  PHYSICIAN ASSISTANT:None ANESTHESIA:   General  PREOPERATIVE INDICATIONS:  [name] is a  58 y.o. female with a diagnosis of Rotator Cuff Tear Left Shoulder who failed conservative measures and elected for surgical management.    The risks benefits and alternatives were discussed with the patient preoperatively including but not limited to the risks of infection, bleeding, nerve injury, cardiopulmonary complications, the need for revision surgery, among others, and the patient was willing to proceed.  OPERATIVE IMPLANTS: none  @ENCIMAGES @  OPERATIVE FINDINGS: small full thickness rotator cuff tear, with type III acromion.  OPERATIVE PROCEDURE: Patient was brought to the operating room and underwent a general anesthetic after an interscalene block.  After adequate levels anesthesia were obtained patient's left upper extremity was prepped using DuraPrep draped into a sterile field with the patient in the beachchair position.  A timeout was called.  The scope was inserted from the posterior portal and anterior portal established outside in technique with an 18-gauge spinal.  Visualization showed a small full-thickness rotator cuff tear.  This was debrided from both internal and external.  The majority the rotator cuff had good attachment on the humeral head.  There was some small fraying of the labrum this was debrided the biceps tendon was intact there was no adhesions to the subscapularis.  There was eburnated cartilage off the humeral head as well as the glenoid.  No loose fragments.  The instruments were then removed the scope was inserted from the posterior portal into the subacromial space and a new lateral portal was  established.  Visualization showed a very tight subacromial space with a hooked type III acromion.  The small rotator cuff tear was further debrided from the subacromial space.  A acromionizer was used to perform a subacromial decompression and this provided good space.  Synovectomy bursectomy was performed to open up the subacromial space.  Electrocautery was used for hemostasis within the joint and the subacromial space.  The instruments removed the portals were closed using 2-0 nylon and a sterile dressing was applied with a sling.   DISCHARGE PLANNING:  Antibiotic duration: Preoperative antibiotics 3 g Kefzol  Weightbearing: Not applicable  Pain medication: Prescription called in for Vicodin  Dressing care/ Wound VAC: Discontinue dressing in 2 days  Ambulatory devices: Not applicable  Discharge to: Home.  Follow-up: In the office 1 week post operative.

## 2019-03-14 NOTE — Anesthesia Postprocedure Evaluation (Signed)
Anesthesia Post Note  Patient: [name]  Procedure(s) Performed: LEFT SHOULDER ARTHROSCOPY, DEBRIDEMENT, AND DECOMPRESSION (Left )     Patient location during evaluation: PACU Anesthesia Type: General Level of consciousness: awake and alert Pain management: pain level controlled Vital Signs Assessment: post-procedure vital signs reviewed and stable Respiratory status: spontaneous breathing, nonlabored ventilation and respiratory function stable Cardiovascular status: blood pressure returned to baseline and stable Postop Assessment: no apparent nausea or vomiting Anesthetic complications: no    Last Vitals:  Vitals:   03/14/19 1115 03/14/19 1120  BP: (!) 116/58 128/61  Pulse: 74   Resp: 20 20  Temp:  36.5 C  SpO2: 98% 98%    Last Pain:  Vitals:   03/14/19 1120  PainSc: 0-No pain                 Lidia Collum

## 2019-03-14 NOTE — H&P (Signed)
[name] is an 58 y.o. female.   Chief Complaint: Left shoulder pain decreased range of motion. HPI: Patient is a 58 year old woman with rotator cuff tear left shoulder.  She is failed prolonged conservative therapy and presents at this time for arthroscopic debridement and decompression.  Past Medical History:  Diagnosis Date  . Abnormal SPEP 04/17/2014  . Acute left ankle pain 01/26/2017  . ANEMIA-UNSPECIFIED 09/18/2009  . Anxiety   . Arthritis   . Chronic diastolic heart failure, NYHA class 2 (HCC)    Normal LVEDP by May 2018  . COPD (chronic obstructive pulmonary disease) ([place])   . Depression   . DIABETES MELLITUS, TYPE II 08/21/2006  . Diabetic osteomyelitis ([place]) 05/29/2015  . Fracture of 5th metatarsal    non union  . GERD 77/93/9030   Had fundoplication  . GOUT 08/20/2010  . Hx of umbilical hernia repair   . HYPERLIPIDEMIA 08/21/2006  . HYPERTENSION 08/21/2006  . Infection of wound due to methicillin resistant Staphylococcus aureus (MRSA)   . Internal hemorrhoids   . Multiple allergies 10/14/2016  . OBESITY 06/04/2009  . Onychomycosis 10/27/2015  . Osteomyelitis of left foot ([place]) 05/29/2015  . Pulmonary sarcoidosis ([place])    Followed locally by pulmonology, but also by Dr. Casper Harrison at [facility]  . Right knee pain 01/26/2017  . Vocal cord dysfunction   . Wears partial dentures     Past Surgical History:  Procedure Laterality Date  . ABDOMINAL HYSTERECTOMY    . APPENDECTOMY    . BLADDER SUSPENSION  11/11/2011   Procedure: TRANSVAGINAL TAPE (TVT) PROCEDURE;  Surgeon: Olga Millers, MD;  Location: [facility];  Service: Gynecology;  Laterality: N/A;  . CAROTIDS  02/18/11   CAROTID DUPLEX; VERTEBRALS ARE PATENT WITH ANTEGRADE FLOW. ICA/CCA RATIO 1.61 ON RIGHT AND 0.75 ON LEFT  . CHOLECYSTECTOMY  1984  . CYSTOSCOPY  11/11/2011   Procedure: CYSTOSCOPY;  Surgeon: Olga Millers, MD;  Location: [facility];  Service: Gynecology;  Laterality: N/A;  . EXTUBATION  (ENDOTRACHEAL) IN OR N/A 09/23/2017   Procedure: EXTUBATION (ENDOTRACHEAL) IN OR;  Surgeon: Helayne Seminole, MD;  Location: [facility];  Service: ENT;  Laterality: N/A;  . FIBEROPTIC LARYNGOSCOPY AND TRACHEOSCOPY N/A 09/23/2017   Procedure: FLEXIBLE FIBEROPTIC LARYNGOSCOPY;  Surgeon: Helayne Seminole, MD;  Location: [facility];  Service: ENT;  Laterality: N/A;  . FRACTURE SURGERY     foot  . HALLUX FUSION Left 10/02/2018   Procedure: HALLUX ARTHRODESIS;  Surgeon: Edrick Kins, DPM;  Location: [facility];  Service: Podiatry;  Laterality: Left;  . HAMMER TOE SURGERY Left 10/02/2018   Procedure: HAMMER TOE CORRECTION 2ND 3RD 4RD FIFTH TOE;  Surgeon: Edrick Kins, DPM;  Location: [facility];  Service: Podiatry;  Laterality: Left;  . HERNIA REPAIR    . I&D EXTREMITY Left 06/27/2015   Procedure: Partial Excision Left Calcaneus, Place Antibiotic Beads, and Wound VAC;  Surgeon: Newt Minion, MD;  Location: [facility];  Service: Orthopedics;  Laterality: Left;  . KNEE ARTHROSCOPY     right  . LEFT AND RIGHT HEART CATHETERIZATION WITH CORONARY ANGIOGRAM N/A 04/23/2013   Procedure: LEFT AND RIGHT HEART CATHETERIZATION WITH CORONARY ANGIOGRAM;  Surgeon: Leonie Man, MD;  Location[facility];  Service: Cardiovascular;  Laterality: N/A;  . LEFT HEART CATH AND CORONARY ANGIOGRAPHY N/A 03/11/2017   Procedure: Left Heart Cath and Coronary Angiography;  Surgeon: Sherren Mocha, MD;  Location: [facility]; angiographically minimal CAD in  the LAD otherwise normal.;  Normal LVEDP.  FALSE POSITIVE MYOVIEW  . LEFT HEART CATH AND CORONARY ANGIOGRAPHY  07/20/2010   LVEF 50-55% WITH VERY MILD GLOBAL HYPOKINESIA; ESSENTIALLY NORMAL CORONARY ARTERIES; NORMAL LV FUNCTION  . METATARSAL OSTEOTOMY WITH OPEN REDUCTION INTERNAL FIXATION (ORIF) METATARSAL WITH FUSION Left 04/09/2014   Procedure: LEFT FOOT FRACTURE OPEN TREATMENT METATARSAL INCLUDES INTERNAL FIXATION EACH;  Surgeon: Lorn Junes, MD;  Location: [place];  Service: Orthopedics;  Laterality: Left;  . NISSEN FUNDOPLICATION  6440  . NM MYOVIEW LTD  03/09/2013   Lexiscan --> EF 50%; NORMAL MYOCARDIAL PERFUSION STUDY - breast attenuation  . NM MYOVIEW LTD  03/10/2017   : Moderate size "stress-induced "perfusion defect at the apex as well as "ill-defined stress-induced perfusion defect in the lateral wall.  EF 55%.  INTERMEDIATE risk. -->  FALSE POSITIVE  . Right and left CARDIAC CATHETERIZATION  04/23/2013   Angiographic normal coronaries; LVEDP 20 mmHg, PCWP 12-14 mmHg, RAP 12 mmHg.; Fick CO/CI 4.9/2.2  . TOTAL KNEE ARTHROPLASTY Right 06/29/2017   Procedure: RIGHT TOTAL KNEE ARTHROPLASTY;  Surgeon: Newt Minion, MD;  Location: [facility];  Service: Orthopedics;  Laterality: Right;  . TOTAL KNEE ARTHROPLASTY Left 12/07/2017  . TOTAL KNEE ARTHROPLASTY Left 12/07/2017   Procedure: LEFT TOTAL KNEE ARTHROPLASTY;  Surgeon: Newt Minion, MD;  Location: [facility];  Service: Orthopedics;  Laterality: Left;  . TRACHEOSTOMY TUBE PLACEMENT N/A 09/20/2017   Procedure: AWAKE INTUBATION WITH ANESTHESIA WITH VIDEO ASSISTANCE;  Surgeon: Helayne Seminole, MD;  Location: [facility];  Service: ENT;  Laterality: N/A;  . TRANSTHORACIC ECHOCARDIOGRAM  08/2014   Normal LV size and function.  Mild LVH.  EF 55-60%.  Normal regional wall motion.  GR 1 DD.  Normal RV size and function .  [name] TUBAL LIGATION     with reversal in 1994  . VENTRAL HERNIA REPAIR      Family History  Problem Relation Age of Onset  . Diabetes Father   . Heart attack Father 70  . Coronary artery disease Father   . Heart failure Father   . Throat cancer Father   . COPD Mother   . Emphysema Mother   . Asthma Mother   . Heart failure Mother   . Breast cancer Mother   . Heart attack Maternal Grandfather   . Sarcoidosis Maternal Uncle   . Lung cancer Brother   . Diabetes Brother   . Colon cancer Neg Hx   . Rectal cancer Neg Hx    Social History:  reports that she has never smoked.  She has never used smokeless tobacco. She reports that she does not drink alcohol or use drugs.  Allergies:  Allergies  Allergen Reactions  . Methotrexate Other (See Comments)    Peri-oral and buccal lesions  . Vancomycin Other (See Comments)    DOSE RELATED NEPHROTOXICITY  . Lisinopril Cough  . Chlorhexidine Itching  . Clindamycin/Lincomycin Nausea And Vomiting and Rash  . Doxycycline Rash  . Teflaro [Ceftaroline] Rash and Other (See Comments)    Tolerates ceftriaxone     Medications Prior to Admission  Medication Sig Dispense Refill  . albuterol (PROVENTIL HFA;VENTOLIN HFA) 108 (90 Base) MCG/ACT inhaler Inhale 1-2 puffs into the lungs every 6 (six) hours as needed for wheezing or shortness of breath. 8 g 5  . allopurinol (ZYLOPRIM) 100 MG tablet Take 100 mg by mouth every evening.     [name] aspirin EC 81 MG tablet Take  81 mg by mouth daily.     [name] atorvastatin (LIPITOR) 40 MG tablet TAKE 1 TAB BY MOUTH ONCE DAILY (Patient taking differently: Take 40 mg by mouth every evening. ) 90 tablet 3  . buPROPion (WELLBUTRIN XL) 300 MG 24 hr tablet TAKE 1 TABLET BY MOUTH EVERY DAY (Patient taking differently: Take 300 mg by mouth daily. ) 90 tablet 3  . carvedilol (COREG) 25 MG tablet TAKE 1 TABLET (25 MG TOTAL) BY MOUTH 2 (TWO) TIMES DAILY WITH A MEAL. 180 tablet 3  . cholecalciferol (VITAMIN D) 25 MCG (1000 UT) tablet Take 1,000 Units by mouth 2 (two) times a day.    [name] dexlansoprazole (DEXILANT) 60 MG capsule Take 1 capsule (60 mg total) by mouth daily. 30 capsule 5  . diltiazem (CARDIZEM CD) 120 MG 24 hr capsule TAKE 1 CAPSULE BY MOUTH EVERY DAY (FOR FUTURE REFILLS MAKE A DOCTORS VISIT) (Patient taking differently: Take 120 mg by mouth every evening. ) 90 capsule 3  . famotidine (PEPCID) 20 MG tablet Take 1 tablet (20 mg total) by mouth at bedtime. 30 tablet 3  . fenofibrate 160 MG tablet TAKE 1 TABLET BY MOUTH EVERY DAY (Patient taking differently: Take 160 mg by mouth every evening. ) 90 tablet 0   . fluticasone (FLONASE) 50 MCG/ACT nasal spray PLACE 2 SPRAYS INTO BOTH NOSTRILS DAILY AS NEEDED FOR ALLERGIES 48 g 1  . fluticasone furoate-vilanterol (BREO ELLIPTA) 200-25 MCG/INH AEPB Inhale 1 puff into the lungs daily. 60 each 3  . furosemide (LASIX) 40 MG tablet TAKE 1 TABLET BY MOUTH TWICE A DAY (Patient taking differently: Take 40 mg by mouth 2 (two) times a day. ) 180 tablet 3  . gabapentin (NEURONTIN) 100 MG capsule TAKE 1 CAPSULE BY MOUTH THREE TIMES A DAY (Patient taking differently: Take 100-200 mg by mouth See admin instructions. Take 1 capsule (100 mg) by mouth in the morning & 2 capsules (200 mg) by mouth at night.) 270 capsule 3  . Insulin Glargine (BASAGLAR KWIKPEN) 100 UNIT/ML SOPN Inject 0.5 mLs (50 Units total) into the skin every morning. 5 pen 2  . insulin lispro (HUMALOG KWIKPEN) 100 UNIT/ML KwikPen Inject 0.3 mLs (30 Units total) into the skin daily with supper. Take PRN, for a total of 25 units per day 5 pen 11  . isosorbide mononitrate (IMDUR) 30 MG 24 hr tablet Take 1 tablet (30 mg total) by mouth at bedtime. 90 tablet 3  . KLOR-CON M20 20 MEQ tablet TAKE 1 & 1/2 TABLETS BY MOUTH TWICE A DAY (Patient taking differently: Take 30 mEq by mouth 2 (two) times a day. ) 270 tablet 2  . lactulose (CHRONULAC) 10 GM/15ML solution Take 15 mLs by mouth daily.    [name] linaclotide (LINZESS) 290 MCG CAPS capsule Take 1 capsule (290 mcg total) by mouth daily before breakfast. (Patient taking differently: Take 290 mcg by mouth daily as needed (constipation.). ) 30 capsule 11  . LORazepam (ATIVAN) 0.5 MG tablet Take 0.5 mg by mouth every 12 (twelve) hours as needed for anxiety.    . metFORMIN (GLUCOPHAGE) 1000 MG tablet TAKE 1 TABLET BY MOUTH TWICE A DAY WITH MEALS (Patient taking differently: Take 1,000 mg by mouth 2 (two) times a day. ) 180 tablet 0  . naproxen (NAPROSYN) 500 MG tablet Take 1 tablet (500 mg total) by mouth 2 (two) times daily with a meal. (Patient taking differently: Take 500  mg by mouth 2 (two) times daily as needed (pain). ) 60  tablet 1  . nitroGLYCERIN (NITROSTAT) 0.4 MG SL tablet Place 1 tablet (0.4 mg total) under the tongue every 5 (five) minutes as needed for chest pain. Reported on 01/05/2016 25 tablet 6  . ondansetron (ZOFRAN-ODT) 4 MG disintegrating tablet Take 1 tablet (4 mg total) by mouth every 8 (eight) hours as needed for nausea or vomiting. 50 tablet 2  . oxyCODONE-acetaminophen (PERCOCET/ROXICET) 5-325 MG tablet Take 1 tablet by mouth every 8 (eight) hours as needed. 20 tablet 0  . PARoxetine Mesylate (BRISDELLE) 7.5 MG CAPS Take 7.5 mg by mouth daily.     . predniSONE (DELTASONE) 5 MG tablet Take 5 mg by mouth daily with breakfast.    . telmisartan (MICARDIS) 20 MG tablet TAKE 1 TABLET BY MOUTH EVERY DAY (Patient taking differently: Take 20 mg by mouth every evening. ) 90 tablet 1  . venlafaxine XR (EFFEXOR-XR) 75 MG 24 hr capsule Take 1 capsule (75 mg total) by mouth 2 (two) times daily. (Patient taking differently: Take 75 mg by mouth every evening. ) 180 capsule 3  . glucose blood (ONETOUCH VERIO) test strip 1 each by Other route 2 (two) times daily. Use as instructed 200 each 3  . Insulin Pen Needle 33G X 4 MM MISC 1 each by Does not apply route 4 (four) times daily. 200 each 11  . OneTouch Delica Lancets 82N MISC 1 each by Does not apply route 2 (two) times a day. E11.29 100 each 2  . predniSONE (DELTASONE) 20 MG tablet Take 2 pills for 5 days (Patient not taking: Reported on 03/07/2019) 10 tablet 0  . sulfamethoxazole-trimethoprim (BACTRIM DS,SEPTRA DS) 800-160 MG tablet Take 1 tablet by mouth 2 (two) times daily. 14 tablet 0    Results for orders placed or performed during the hospital encounter of 03/14/19 (from the past 48 hour(s))  Glucose, capillary     Status: Abnormal   Collection Time: 03/14/19  7:39 AM  Result Value Ref Range   Glucose-Capillary 120 (H) 70 - 99 mg/dL   *Note: Due to a large number of results and/or encounters for the  requested time period, some results have not been displayed. A complete set of results can be found in Results Review.   No results found.  Review of Systems  All other systems reviewed and are negative.   Blood pressure (!) 108/52, pulse 67, temperature 98.6 F (37 C), resp. rate 19, height 5\' 6"  (1.676 m), weight 120 kg, SpO2 100 %. Physical Exam  Examination patient is alert oriented no adenopathy well-dressed normal affect normal respiratory effort.  Examination left shoulder she has abduction flexion to 70 degrees.  She has pain with Neer and Hawkins impingement test she has pain to palpation over the biceps tendon. Assessment/Plan Assessment: Left shoulder rotator cuff tear with impingement.  Plan: We will plan for left shoulder arthroscopy with debridement and decompression.  Risk and benefits were discussed including persistent pain need for additional surgery.  Patient states she understands and wished to proceed at this time.  Newt Minion, MD 03/14/2019, 9:37 AM

## 2019-03-14 NOTE — Anesthesia Procedure Notes (Signed)
Procedure Name: Intubation Date/Time: 03/14/2019 9:46 AM Performed by: Orlie Dakin, CRNA Pre-anesthesia Checklist: Patient identified, Emergency Drugs available, Suction available and Patient being monitored Patient Re-evaluated:Patient Re-evaluated prior to induction Oxygen Delivery Method: Circle system utilized Preoxygenation: Pre-oxygenation with 100% oxygen Induction Type: IV induction and Rapid sequence Laryngoscope Size: Miller and 3 Grade View: Grade I Tube type: Oral Tube size: 7.0 mm Number of attempts: 1 Airway Equipment and Method: Stylet Placement Confirmation: ETT inserted through vocal cords under direct vision,  positive ETCO2 and breath sounds checked- equal and bilateral Secured at: 22 cm Tube secured with: Tape Dental Injury: Teeth and Oropharynx as per pre-operative assessment  Comments: RSI due to Covid-19 pandemic concerns.

## 2019-03-14 NOTE — Transfer of Care (Signed)
Immediate Anesthesia Transfer of Care Note  Patient: [name]  Procedure(s) Performed: LEFT SHOULDER ARTHROSCOPY, DEBRIDEMENT, AND DECOMPRESSION (Left )  Patient Location: PACU  Anesthesia Type:General and Regional  Level of Consciousness: awake and patient cooperative  Airway & Oxygen Therapy: Patient Spontanous Breathing and Patient connected to face mask oxygen  Post-op Assessment: Report given to RN and Post -op Vital signs reviewed and stable  Post vital signs: Reviewed and stable  Last Vitals:  Vitals Value Taken Time  BP    Temp    Pulse 77 03/14/2019 10:49 AM  Resp 21 03/14/2019 10:49 AM  SpO2 98 % 03/14/2019 10:49 AM  Vitals shown include unvalidated device data.  Last Pain:  Vitals:   03/14/19 0803  PainSc: 4       Patients Stated Pain Goal: 2 (88/11/03 1594)  Complications: No apparent anesthesia complications

## 2019-03-15 ENCOUNTER — Other Ambulatory Visit: Payer: Self-pay | Admitting: Endocrinology

## 2019-03-15 ENCOUNTER — Telehealth: Payer: Self-pay | Admitting: Orthopedic Surgery

## 2019-03-15 ENCOUNTER — Encounter (HOSPITAL_COMMUNITY): Payer: Self-pay | Admitting: Orthopedic Surgery

## 2019-03-15 ENCOUNTER — Ambulatory Visit: Payer: 59

## 2019-03-15 NOTE — Telephone Encounter (Signed)
Pt called in said she had surgery with dr.duda yesterday 03-14-2019 and is wondering if she should leave her dressings on for a week until she come's in for her post op visit on 03-21-2019 or  remove them before then. Pt would also like to know if there are other limitations besides lifting.  [phone]

## 2019-03-16 ENCOUNTER — Telehealth: Payer: Self-pay | Admitting: Orthopedic Surgery

## 2019-03-16 NOTE — Telephone Encounter (Signed)
Patient was called and advised she can remove bandages and shower as normal and to continue to wear her sling. Her appt is Wed.

## 2019-03-16 NOTE — Telephone Encounter (Signed)
Patient called wanting to know when she can take bandage off.  Please cal patient to advise.  [phone]

## 2019-03-21 ENCOUNTER — Encounter: Payer: Self-pay | Admitting: Family

## 2019-03-21 ENCOUNTER — Telehealth: Payer: Self-pay | Admitting: *Deleted

## 2019-03-21 ENCOUNTER — Other Ambulatory Visit: Payer: Self-pay

## 2019-03-21 ENCOUNTER — Ambulatory Visit (INDEPENDENT_AMBULATORY_CARE_PROVIDER_SITE_OTHER): Payer: 59 | Admitting: Family

## 2019-03-21 VITALS — Ht 66.0 in | Wt 264.0 lb

## 2019-03-21 DIAGNOSIS — M75122 Complete rotator cuff tear or rupture of left shoulder, not specified as traumatic: Secondary | ICD-10-CM

## 2019-03-21 DIAGNOSIS — M2040 Other hammer toe(s) (acquired), unspecified foot: Secondary | ICD-10-CM

## 2019-03-21 NOTE — Progress Notes (Signed)
Post-Op Visit Note   Patient: [name]           Date of Birth: 09-01-1961           MRN: [id number] Visit Date: 03/21/2019 PCP: [name], MD  Chief Complaint:  Chief Complaint  Patient presents with  . Left Shoulder - Routine Post Op    03/14/19 left shoulder scope     HPI:  HPI The patient is a 58 year old woman seen 1 week status post left shoulder arthroscopy. Doing well. In a sling today. States has been trying to do pendulum swings, cannot reach above head yet due to pain. Patient wondering if she will require PT. Ortho Exam Portals are clean and dry. Sutures harvested. No erythema or odor. Mild ecchymosis. Full passive rom with pain abduction.   Visit Diagnoses:  1. Nontraumatic complete tear of left rotator cuff     Plan: will work on rom, exercises shown with return demonstration. To follow up in office in 4 weeks, may refer to PT at that point based on need and patient interest.  Follow-Up Instructions: Return in about 4 weeks (around 04/18/2019).   Imaging: No results found.  Orders:  No orders of the defined types were placed in this encounter.  No orders of the defined types were placed in this encounter.    PMFS History: Patient Active Problem List   Diagnosis Date Noted  . Nontraumatic complete tear of left rotator cuff   . Impingement syndrome of left shoulder   . Diabetic neuropathy ([place]) 02/20/2019  . Severe persistent asthma 01/04/2019  . Subcortical microvascular ischemic occlusive disease 07/12/2018  . Rapid palpitations 07/12/2018  . Impingement of left ankle joint 06/26/2018  . Constipation 04/21/2018  . Anxiety 04/05/2018  . Total knee replacement status, left 12/07/2017  . Dysphagia 09/20/2017  . Epiglottitis   . Plantar fasciitis, right 07/13/2017  . S/P total knee arthroplasty, right 06/29/2017  . Osteoarthrosis, localized, primary, knee, right   . Sprain of calcaneofibular ligament of right ankle 06/06/2017  .  Osteoarthritis of right knee 01/26/2017  . Onychomycosis 10/27/2015  . CKD (chronic kidney disease) stage 3, GFR 30-59 ml/min (HCC) 04/27/2015  . MRSA (methicillin resistant staph aureus) culture positive 03/27/2015  . History of osteomyelitis 03/27/2015  . Fatty liver 09/30/2014  . Hot flashes 07/15/2014  . Abnormal SPEP 04/17/2014  . Fracture of left leg 04/17/2014  . Cushingoid side effect of steroids ([place]) 04/17/2014  . Internal hemorrhoids   . Depression   . Preoperative clearance 03/25/2014  . Obstructive chronic bronchitis without exacerbation ([place]) 09/18/2013  . Chest pain 04/11/2013  . Hypertensive heart disease with chronic diastolic congestive heart failure ([place])   . Solitary pulmonary nodule, on CT 02/2013 - stable over 2 years in 2015 02/20/2013  . Gout 08/20/2010  . Anemia 09/18/2009  . Morbid obesity ([place]) 06/04/2009  . Sleep apnea 04/21/2009  . Sarcoidosis of lung ([place]) 04/10/2007  . Hyperlipidemia associated with type 2 diabetes mellitus ([place]) 08/21/2006  . Hypertension associated with diabetes ([place]) 08/21/2006  . GERD 08/21/2006  . Type II diabetes mellitus with renal manifestations ([place]) 08/21/2006   Past Medical History:  Diagnosis Date  . Abnormal SPEP 04/17/2014  . Acute left ankle pain 01/26/2017  . ANEMIA-UNSPECIFIED 09/18/2009  . Anxiety   . Arthritis   . Chronic diastolic heart failure, NYHA class 2 (HCC)    Normal LVEDP by May 2018  . COPD (chronic obstructive pulmonary disease) ([place])   .  Depression   . DIABETES MELLITUS, TYPE II 08/21/2006  . Diabetic osteomyelitis ([place]) 05/29/2015  . Fracture of 5th metatarsal    non union  . GERD 25/95/6387   Had fundoplication  . GOUT 08/20/2010  . Hx of umbilical hernia repair   . HYPERLIPIDEMIA 08/21/2006  . HYPERTENSION 08/21/2006  . Infection of wound due to methicillin resistant Staphylococcus aureus (MRSA)   . Internal hemorrhoids   . Multiple allergies 10/14/2016  . OBESITY 06/04/2009  . Onychomycosis  10/27/2015  . Osteomyelitis of left foot ([place]) 05/29/2015  . Pulmonary sarcoidosis ([place])    Followed locally by pulmonology, but also by Dr. Casper Harrison at [facility]  . Right knee pain 01/26/2017  . Vocal cord dysfunction   . Wears partial dentures     Family History  Problem Relation Age of Onset  . Diabetes Father   . Heart attack Father 95  . Coronary artery disease Father   . Heart failure Father   . Throat cancer Father   . COPD Mother   . Emphysema Mother   . Asthma Mother   . Heart failure Mother   . Breast cancer Mother   . Heart attack Maternal Grandfather   . Sarcoidosis Maternal Uncle   . Lung cancer Brother   . Diabetes Brother   . Colon cancer Neg Hx   . Rectal cancer Neg Hx     Past Surgical History:  Procedure Laterality Date  . ABDOMINAL HYSTERECTOMY    . APPENDECTOMY    . BLADDER SUSPENSION  11/11/2011   Procedure: TRANSVAGINAL TAPE (TVT) PROCEDURE;  Surgeon: Olga Millers, MD;  Location: [facility];  Service: Gynecology;  Laterality: N/A;  . CAROTIDS  02/18/11   CAROTID DUPLEX; VERTEBRALS ARE PATENT WITH ANTEGRADE FLOW. ICA/CCA RATIO 1.61 ON RIGHT AND 0.75 ON LEFT  . CHOLECYSTECTOMY  1984  . CYSTOSCOPY  11/11/2011   Procedure: CYSTOSCOPY;  Surgeon: Olga Millers, MD;  Location: [facility];  Service: Gynecology;  Laterality: N/A;  . EXTUBATION (ENDOTRACHEAL) IN OR N/A 09/23/2017   Procedure: EXTUBATION (ENDOTRACHEAL) IN OR;  Surgeon: Helayne Seminole, MD;  Location: [facility];  Service: ENT;  Laterality: N/A;  . FIBEROPTIC LARYNGOSCOPY AND TRACHEOSCOPY N/A 09/23/2017   Procedure: FLEXIBLE FIBEROPTIC LARYNGOSCOPY;  Surgeon: Helayne Seminole, MD;  Location: [facility];  Service: ENT;  Laterality: N/A;  . FRACTURE SURGERY     foot  . HALLUX FUSION Left 10/02/2018   Procedure: HALLUX ARTHRODESIS;  Surgeon: Edrick Kins, DPM;  Location: [facility];  Service: Podiatry;  Laterality: Left;  . HAMMER TOE SURGERY Left 10/02/2018   Procedure: HAMMER TOE CORRECTION 2ND  3RD 4RD FIFTH TOE;  Surgeon: Edrick Kins, DPM;  Location: [facility];  Service: Podiatry;  Laterality: Left;  . HERNIA REPAIR    . I&D EXTREMITY Left 06/27/2015   Procedure: Partial Excision Left Calcaneus, Place Antibiotic Beads, and Wound VAC;  Surgeon: Newt Minion, MD;  Location: [facility];  Service: Orthopedics;  Laterality: Left;  . KNEE ARTHROSCOPY     right  . LEFT AND RIGHT HEART CATHETERIZATION WITH CORONARY ANGIOGRAM N/A 04/23/2013   Procedure: LEFT AND RIGHT HEART CATHETERIZATION WITH CORONARY ANGIOGRAM;  Surgeon: Leonie Man, MD;  Location: [facility] C[facility];  Service: Cardiovascular;  Laterality: N/A;  . LEFT HEART CATH AND CORONARY ANGIOGRAPHY N/A 03/11/2017   Procedure: Left Heart Cath and Coronary Angiography;  Surgeon: Sherren Mocha, MD;  Location: [facility]; angiographically minimal CAD in the  LAD otherwise normal.;  Normal LVEDP.  FALSE POSITIVE MYOVIEW  . LEFT HEART CATH AND CORONARY ANGIOGRAPHY  07/20/2010   LVEF 50-55% WITH VERY MILD GLOBAL HYPOKINESIA; ESSENTIALLY NORMAL CORONARY ARTERIES; NORMAL LV FUNCTION  . METATARSAL OSTEOTOMY WITH OPEN REDUCTION INTERNAL FIXATION (ORIF) METATARSAL WITH FUSION Left 04/09/2014   Procedure: LEFT FOOT FRACTURE OPEN TREATMENT METATARSAL INCLUDES INTERNAL FIXATION EACH;  Surgeon: Lorn Junes, MD;  Location: [facility];  Service: Orthopedics;  Laterality: Left;  . NISSEN FUNDOPLICATION  1308  . NM MYOVIEW LTD  03/09/2013   Lexiscan --> EF 50%; NORMAL MYOCARDIAL PERFUSION STUDY - breast attenuation  . NM MYOVIEW LTD  03/10/2017   : Moderate size "stress-induced "perfusion defect at the apex as well as "ill-defined stress-induced perfusion defect in the lateral wall.  EF 55%.  INTERMEDIATE risk. -->  FALSE POSITIVE  . Right and left CARDIAC CATHETERIZATION  04/23/2013   Angiographic normal coronaries; LVEDP 20 mmHg, PCWP 12-14 mmHg, RAP 12 mmHg.; Fick CO/CI 4.9/2.2  . SHOULDER ARTHROSCOPY Left 03/14/2019   Procedure: LEFT  SHOULDER ARTHROSCOPY, DEBRIDEMENT, AND DECOMPRESSION;  Surgeon: Newt Minion, MD;  Location: [facility];  Service: Orthopedics;  Laterality: Left;  . TOTAL KNEE ARTHROPLASTY Right 06/29/2017   Procedure: RIGHT TOTAL KNEE ARTHROPLASTY;  Surgeon: Newt Minion, MD;  Location: [facility];  Service: Orthopedics;  Laterality: Right;  . TOTAL KNEE ARTHROPLASTY Left 12/07/2017  . TOTAL KNEE ARTHROPLASTY Left 12/07/2017   Procedure: LEFT TOTAL KNEE ARTHROPLASTY;  Surgeon: Newt Minion, MD;  Location: [facility];  Service: Orthopedics;  Laterality: Left;  . TRACHEOSTOMY TUBE PLACEMENT N/A 09/20/2017   Procedure: AWAKE INTUBATION WITH ANESTHESIA WITH VIDEO ASSISTANCE;  Surgeon: Helayne Seminole, MD;  Location: [facility];  Service: ENT;  Laterality: N/A;  . TRANSTHORACIC ECHOCARDIOGRAM  08/2014   Normal LV size and function.  Mild LVH.  EF 55-60%.  Normal regional wall motion.  GR 1 DD.  Normal RV size and function .  [name] TUBAL LIGATION     with reversal in 1994  . VENTRAL HERNIA REPAIR     Social History   Occupational History  . Occupation: DISABLED  Tobacco Use  . Smoking status: Never Smoker  . Smokeless tobacco: Never Used  Substance and Sexual Activity  . Alcohol use: No    Alcohol/week: 0.0 standard drinks  . Drug use: No  . Sexual activity: Yes    Birth control/protection: Surgical

## 2019-03-21 NOTE — Telephone Encounter (Signed)
"  I'm ready to schedule my foot surgery with Dr. Amalia Hailey."  Her Husband took over and stated, "She's supposed to have Hammer Toe surgery.  It got pushed back.  If you can get that scheduled, and let us know."

## 2019-03-26 NOTE — Telephone Encounter (Signed)
"  I'm calling to let you know that I'm ready to have my Hammer Toe surgery.  If you could give me a call I'd appreciate it.  I'm Dr. Amalia Hailey' patient."

## 2019-03-29 ENCOUNTER — Other Ambulatory Visit: Payer: Self-pay | Admitting: Podiatry

## 2019-03-30 NOTE — Telephone Encounter (Signed)
I am returning your call.  I apologize for the late call back.  "It's alright."  Do you have at date that you like?  "I'd like to do it as soon as possible.  My toes are killing me."  He can do it at [facility] on April 12, 2019.  "That date will be fine."  You know it's being done at the hospital.  They require a history and physical form to be completed.  "I just had surgery there about three weeks ago.  I still have to have another one filled out?"  If your physical was between June 2 and July 2, you'll be fine.  "I don't know about that.  It won't be a problem getting it."  You also must have a Covid test done.  "I had that done at [facility].  Do I have to get another one?"  Yes, you do.  Someone in pre-admission testing will give you a call to schedule that appointment.    (Dr. Amalia Hailey, please sign the Covid-19 order and please enter the pre-op orders.)

## 2019-04-02 ENCOUNTER — Other Ambulatory Visit (HOSPITAL_COMMUNITY): Payer: 59

## 2019-04-02 ENCOUNTER — Ambulatory Visit: Admit: 2019-04-02 | Payer: Medicare Other | Admitting: Podiatry

## 2019-04-02 SURGERY — CORRECTION, HAMMER TOE
Anesthesia: General | Laterality: Right

## 2019-04-04 ENCOUNTER — Other Ambulatory Visit: Payer: Self-pay | Admitting: Family Medicine

## 2019-04-04 ENCOUNTER — Ambulatory Visit (INDEPENDENT_AMBULATORY_CARE_PROVIDER_SITE_OTHER): Payer: 59 | Admitting: Podiatry

## 2019-04-04 ENCOUNTER — Encounter: Payer: Self-pay | Admitting: Podiatry

## 2019-04-04 ENCOUNTER — Ambulatory Visit (INDEPENDENT_AMBULATORY_CARE_PROVIDER_SITE_OTHER): Payer: 59

## 2019-04-04 ENCOUNTER — Other Ambulatory Visit: Payer: Self-pay

## 2019-04-04 VITALS — Temp 97.3°F

## 2019-04-04 DIAGNOSIS — M2042 Other hammer toe(s) (acquired), left foot: Secondary | ICD-10-CM | POA: Diagnosis not present

## 2019-04-04 DIAGNOSIS — Z9889 Other specified postprocedural states: Secondary | ICD-10-CM | POA: Diagnosis not present

## 2019-04-04 DIAGNOSIS — M2041 Other hammer toe(s) (acquired), right foot: Secondary | ICD-10-CM | POA: Diagnosis not present

## 2019-04-06 ENCOUNTER — Telehealth: Payer: Self-pay | Admitting: *Deleted

## 2019-04-06 ENCOUNTER — Telehealth: Payer: Self-pay | Admitting: Podiatry

## 2019-04-06 NOTE — Telephone Encounter (Signed)
DOS 04/12/2019; 03159 - HAMMER TOE REPAIR 2,3,4, 5 RIGHT FOOT  UHC: 10/11/2018 - 10/11/2019  Individual In-Network (Calendar Year) Deductible Deductible has been met  $0.00 remaining  $2,500.00 Plan Amt.   Out-of-Pocket Out-of-Pocket Maximum has been met  $0.00 remaining  $6,350.00 Plan Amt   Physician Fees for Surgical and Medical Services 30% co-insurance, after you pay the deductible          The notification/prior authorization reference number is [id number]. Please print this page for your records.

## 2019-04-06 NOTE — Telephone Encounter (Signed)
"  We need orders put in for [facility].  She's scheduled for surgery on 04/12/2019.  The orders need to be in by 4 pm today.  She's scheduled for her pre-op appointment on Tuesday of next week."  I'll let Dr. Amalia Hailey know.

## 2019-04-06 NOTE — Telephone Encounter (Signed)
Done prior to 4pm today. - Dr. Amalia Hailey

## 2019-04-07 NOTE — Progress Notes (Signed)
   Subjective:  Patient presents today status post left forefoot reconstructive surgery. DOS: 10/02/18. She reports some continued discomfort of the left foot. She has been taking Vicodin and Naproxen for pain. There are no modifying factors noted. Patient is here for further evaluation and treatment.   Past Medical History:  Diagnosis Date  . Abnormal SPEP 04/17/2014  . Acute left ankle pain 01/26/2017  . ANEMIA-UNSPECIFIED 09/18/2009  . Anxiety   . Arthritis   . Chronic diastolic heart failure, NYHA class 2 (HCC)    Normal LVEDP by May 2018  . COPD (chronic obstructive pulmonary disease) ([place])   . Depression   . DIABETES MELLITUS, TYPE II 08/21/2006  . Diabetic osteomyelitis ([place]) 05/29/2015  . Fracture of 5th metatarsal    non union  . GERD 25/02/3975   Had fundoplication  . GOUT 08/20/2010  . Hx of umbilical hernia repair   . HYPERLIPIDEMIA 08/21/2006  . HYPERTENSION 08/21/2006  . Infection of wound due to methicillin resistant Staphylococcus aureus (MRSA)   . Internal hemorrhoids   . Multiple allergies 10/14/2016  . OBESITY 06/04/2009  . Onychomycosis 10/27/2015  . Osteomyelitis of left foot ([place]) 05/29/2015  . Pulmonary sarcoidosis ([place])    Followed locally by pulmonology, but also by Dr. Casper Harrison at [facility]  . Right knee pain 01/26/2017  . Vocal cord dysfunction   . Wears partial dentures       Objective/Physical Exam Neurovascular status intact.  Skin incisions are healed.  No open wounds noted.  Moderate edema still noted throughout the digits of the surgical foot left lower extremity. Hammertoe contracture deformity noted to digits 2-4 of the left foot.  Radiographic Exam:  Orthopedic hardware and osteotomies sites appear to be stable with routine healing. Hammertoe contracture deformity noted to the interphalangeal joints and MPJ of the respective hammertoe digits mentioned on clinical musculoskeletal exam.   Assessment: 1. s/p left forefoot  reconstructive surgery. DOS: 10/02/18 2.  Hammertoe deformity digits 2-5 right foot   Plan of Care:  1. Patient was evaluated. X-Rays reviewed.  2. Surgery scheduled for right foot on 04/12/2019.  3. We will address residual hammertoes of the left foot after the right foot heals.  4. Return to clinic one week post op.    Edrick Kins, DPM [facility]  Dr. Edrick Kins, [address]                                        [place], [place] [address]                Office [phone]  Fax [phone]

## 2019-04-09 ENCOUNTER — Other Ambulatory Visit (HOSPITAL_COMMUNITY)
Admission: RE | Admit: 2019-04-09 | Discharge: 2019-04-09 | Disposition: A | Discharge status: Home / Self Care | Payer: 59 | Source: Ambulatory Visit | Attending: Podiatry | Admitting: Podiatry

## 2019-04-09 DIAGNOSIS — Z1159 Encounter for screening for other viral diseases: Secondary | ICD-10-CM | POA: Insufficient documentation

## 2019-04-09 DIAGNOSIS — Z01812 Encounter for preprocedural laboratory examination: Secondary | ICD-10-CM | POA: Insufficient documentation

## 2019-04-09 LAB — SARS CORONAVIRUS 2 (TAT 6-24 HRS): SARS Coronavirus 2: NEGATIVE

## 2019-04-09 NOTE — Progress Notes (Signed)
[name], [place] [address] [place] [address] Phone: [phone] Fax: [phone]  [name] [place], [place] [name] [name] [address] Phone: [phone] Fax: [phone]  [place], [place] - [address] [place] [address] Phone: [phone] Fax: [phone]  [place], [address] [place] [address] Phone: [phone] Fax: [phone]  [facility] [place], [place] AT [address] [place] [address] Phone: [phone] Fax: [phone]  [name] [place], [name] [address] Phone: [phone] Fax: [phone]      Your procedure is scheduled on July 2  Report to [facility] Main Entrance "A" at 1230 A.M., and check in at the Admitting office.  Call this number if you have problems the morning of surgery:  [phone]  Call [phone] if you have any questions prior to your surgery date Monday-Friday 8am-4pm    Remember:  Do not eat or drink after midnight.    Take these medicines the morning of surgery with A SIP OF WATER  albuterol (PROVENTIL HFA;VENTOLIN HFA)  allopurinol (ZYLOPRIM)  buPROPion (WELLBUTRIN XL carvedilol (COREG) dexlansoprazole (DEXILANT)  diltiazem (CARDIZEM CD famotidine (PEPCID) fluticasone (FLONASE) gabapentin (NEURONTIN) HYDROcodone-acetaminophen (NORCO/VICODIN) if needed isosorbide mononitrate (IMDUR) LORazepam (ATIVAN) predniSONE (DELTASONE) venlafaxine XR (EFFEXOR-XR   Follow your surgeon's instructions on when to stop Aspirin.  If no instructions were given by your surgeon then you will need to call the office to get those instructions.     7 days prior to surgery STOP taking any Aleve, Naproxen, Ibuprofen, Motrin, Advil, Goody's, BC's, all herbal medications, fish oil, and all vitamins.   WHAT DO I DO ABOUT MY DIABETES MEDICATION?   [name] Do not take oral diabetes medicines (pills) the morning of surgery. metFORMIN (GLUCOPHAGE)   . THE NIGHT BEFORE SURGERY, take __25_________ units of _Insulin Glargine (BASAGLAR KWIKPEN)__________insulin.      How to Manage Your Diabetes Before and After Surgery  Why is it important to control my blood sugar before and after surgery? . Improving blood sugar levels before and after surgery helps healing and can limit problems. . A way of improving blood sugar control is eating a healthy diet by: o  Eating less sugar and carbohydrates o  Increasing activity/exercise o  Talking with your doctor about reaching your blood sugar goals . High blood sugars (greater than 180 mg/dL) can raise your risk of infections and slow your recovery, so you will need to focus on controlling your diabetes during the weeks before surgery. . Make sure that the doctor who takes care of your diabetes knows about your planned surgery including the date and location.  How do I manage my blood sugar before surgery? . Check your blood sugar at least 4 times a day, starting 2 days before surgery, to make sure that the level is not too high or low. o Check your blood sugar the morning of your surgery when you wake up and every 2 hours until you get to the Short Stay unit. . If your blood sugar is less than 70 mg/dL, you will need to treat for low blood sugar: o Do not take insulin. o Treat  a low blood sugar (less than 70 mg/dL) with  cup of clear juice (cranberry or apple), 4 glucose tablets, OR glucose gel. o Recheck blood sugar in 15 minutes after treatment (to make sure it is greater than 70 mg/dL). If your blood sugar is not greater than 70 mg/dL on recheck, call [phone] for further instructions. . Report your  blood sugar to the short stay nurse when you get to Short Stay.  . If you are admitted to the hospital after surgery: o Your blood sugar will be checked by the staff and you will probably be given insulin after surgery (instead of oral diabetes medicines) to make sure you have good blood sugar levels. o The goal for blood sugar control after surgery is 80-180 mg/dL.    The Morning of Surgery  Do not wear jewelry, make-up or nail polish.  Do not wear lotions, powders, or perfumes/colognes, or deodorant  Do not shave 48 hours prior to surgery.    Do not bring valuables to the hospital.  [facility] is not responsible for any belongings or valuables.  If you are a smoker, DO NOT Smoke 24 hours prior to surgery IF you wear a CPAP at night please bring your mask, tubing, and machine the morning of surgery   Remember that you must have someone to transport you home after your surgery, and remain with you for 24 hours if you are discharged the same day.   Contacts, glasses, hearing aids, dentures or bridgework may not be worn into surgery.    Leave your suitcase in the car.  After surgery it may be brought to your room.  For patients admitted to the hospital, discharge time will be determined by your treatment team.  Patients discharged the day of surgery will not be allowed to drive home.    Special instructions:   [facility]- Preparing For Surgery  Before surgery, you can play an important role. Because skin is not sterile, your skin needs to be as free of germs as possible. You can reduce the number of germs on your skin by washing with CHG (chlorahexidine gluconate) Soap before surgery.  CHG is an antiseptic cleaner which kills germs and bonds with the skin to continue killing germs even after washing.    Oral Hygiene is also important to reduce your risk of infection.  Remember - BRUSH YOUR TEETH THE MORNING OF SURGERY WITH YOUR REGULAR TOOTHPASTE  Please do not use if you have an  allergy to CHG or antibacterial soaps. If your skin becomes reddened/irritated stop using the CHG.  Do not shave (including legs and underarms) for at least 48 hours prior to first CHG shower. It is OK to shave your face.  Please follow these instructions carefully.   1. Shower the NIGHT BEFORE SURGERY and the MORNING OF SURGERY with CHG Soap.   2. If you chose to wash your hair, wash your hair first as usual with your normal shampoo.  3. After you shampoo, rinse your hair and body thoroughly to remove the shampoo.  4. Use CHG as you would any other liquid soap. You can apply CHG directly to the skin and wash gently with a scrungie or a clean washcloth.   5. Apply the CHG Soap to your body ONLY FROM THE NECK DOWN.  Do not use on open wounds or open sores. Avoid contact with your eyes, ears, mouth and genitals (private parts). Wash Face and genitals (private parts)  with your normal soap.  6. Wash thoroughly, paying special attention to the area where your surgery will be performed.  7. Thoroughly rinse your body with warm water from the neck down.  8. DO NOT shower/wash with your normal soap after using and rinsing off the CHG Soap.  9. Pat yourself dry with a CLEAN TOWEL.  10. Wear CLEAN PAJAMAS to bed the night before surgery, wear comfortable clothes the morning of surgery  11. Place CLEAN SHEETS on your bed the night of your first shower and DO NOT SLEEP WITH PETS.    Day of Surgery:  Do not apply any deodorants/lotions.  Please wear clean clothes to the hospital/surgery center.   Remember to brush your teeth WITH YOUR REGULAR TOOTHPASTE.   Please read over the following fact sheets that you were given.

## 2019-04-10 ENCOUNTER — Ambulatory Visit (INDEPENDENT_AMBULATORY_CARE_PROVIDER_SITE_OTHER): Payer: 59 | Admitting: Family Medicine

## 2019-04-10 ENCOUNTER — Encounter: Payer: Self-pay | Admitting: Family Medicine

## 2019-04-10 ENCOUNTER — Encounter (HOSPITAL_COMMUNITY)
Admission: RE | Admit: 2019-04-10 | Discharge: 2019-04-10 | Disposition: A | Discharge status: Home / Self Care | Payer: 59 | Source: Ambulatory Visit | Attending: Podiatry | Admitting: Podiatry

## 2019-04-10 ENCOUNTER — Encounter (HOSPITAL_COMMUNITY): Payer: Self-pay

## 2019-04-10 ENCOUNTER — Other Ambulatory Visit: Payer: Self-pay

## 2019-04-10 VITALS — BP 131/59 | HR 71 | Temp 97.4°F | Ht 66.0 in | Wt 249.0 lb

## 2019-04-10 DIAGNOSIS — M2041 Other hammer toe(s) (acquired), right foot: Secondary | ICD-10-CM | POA: Diagnosis present

## 2019-04-10 DIAGNOSIS — E1159 Type 2 diabetes mellitus with other circulatory complications: Secondary | ICD-10-CM

## 2019-04-10 DIAGNOSIS — Z794 Long term (current) use of insulin: Secondary | ICD-10-CM

## 2019-04-10 DIAGNOSIS — I152 Hypertension secondary to endocrine disorders: Secondary | ICD-10-CM

## 2019-04-10 DIAGNOSIS — Z6841 Body Mass Index (BMI) 40.0 and over, adult: Secondary | ICD-10-CM | POA: Diagnosis not present

## 2019-04-10 DIAGNOSIS — J449 Chronic obstructive pulmonary disease, unspecified: Secondary | ICD-10-CM

## 2019-04-10 DIAGNOSIS — E1129 Type 2 diabetes mellitus with other diabetic kidney complication: Secondary | ICD-10-CM | POA: Diagnosis not present

## 2019-04-10 DIAGNOSIS — I10 Essential (primary) hypertension: Secondary | ICD-10-CM

## 2019-04-10 DIAGNOSIS — D860 Sarcoidosis of lung: Secondary | ICD-10-CM | POA: Diagnosis not present

## 2019-04-10 DIAGNOSIS — G4730 Sleep apnea, unspecified: Secondary | ICD-10-CM | POA: Diagnosis not present

## 2019-04-10 DIAGNOSIS — F3342 Major depressive disorder, recurrent, in full remission: Secondary | ICD-10-CM

## 2019-04-10 DIAGNOSIS — I110 Hypertensive heart disease with heart failure: Secondary | ICD-10-CM | POA: Diagnosis not present

## 2019-04-10 DIAGNOSIS — E1169 Type 2 diabetes mellitus with other specified complication: Secondary | ICD-10-CM

## 2019-04-10 DIAGNOSIS — E119 Type 2 diabetes mellitus without complications: Secondary | ICD-10-CM | POA: Diagnosis not present

## 2019-04-10 DIAGNOSIS — E785 Hyperlipidemia, unspecified: Secondary | ICD-10-CM

## 2019-04-10 DIAGNOSIS — N183 Chronic kidney disease, stage 3 unspecified: Secondary | ICD-10-CM

## 2019-04-10 DIAGNOSIS — I5032 Chronic diastolic (congestive) heart failure: Secondary | ICD-10-CM

## 2019-04-10 LAB — CBC
HCT: 33.3 % — ABNORMAL LOW (ref 36.0–46.0)
Hemoglobin: 10.2 g/dL — ABNORMAL LOW (ref 12.0–15.0)
MCH: 27.1 pg (ref 26.0–34.0)
MCHC: 30.6 g/dL (ref 30.0–36.0)
MCV: 88.6 fL (ref 80.0–100.0)
Platelets: 341 10*3/uL (ref 150–400)
RBC: 3.76 MIL/uL — ABNORMAL LOW (ref 3.87–5.11)
RDW: 13.6 % (ref 11.5–15.5)
WBC: 9.7 10*3/uL (ref 4.0–10.5)
nRBC: 0 % (ref 0.0–0.2)

## 2019-04-10 LAB — BASIC METABOLIC PANEL
Anion gap: 11 (ref 5–15)
BUN: 12 mg/dL (ref 6–20)
CO2: 29 mmol/L (ref 22–32)
Calcium: 9.3 mg/dL (ref 8.9–10.3)
Chloride: 100 mmol/L (ref 98–111)
Creatinine, Ser: 0.99 mg/dL (ref 0.44–1.00)
GFR calc Af Amer: >60 mL/min (ref >60)
GFR calc non Af Amer: >60 mL/min (ref >60)
Glucose, Bld: 206 mg/dL — ABNORMAL HIGH (ref 70–99)
Potassium: 4.2 mmol/L (ref 3.5–5.1)
Sodium: 140 mmol/L (ref 135–145)

## 2019-04-10 LAB — SURGICAL PCR SCREEN
MRSA, PCR: NEGATIVE
Staphylococcus aureus: POSITIVE — AB

## 2019-04-10 LAB — GLUCOSE, CAPILLARY: Glucose-Capillary: 187 mg/dL — ABNORMAL HIGH (ref 70–99)

## 2019-04-10 NOTE — Progress Notes (Signed)
PCP - Garret Reddish, MD Cardiologist - [name], MD  Chest x-ray - 08/23/2018-in EPIC EKG - 07/12/2018- in EPIC  Stress Test - 2018 ECHO - 09/02/2014  Cardiac Cath - 03/11/2017- in EPIC   Sleep Study - No CPAP - n/a  Fasting Blood Sugar - 140s-150s Checks Blood Sugar 3 times a day  Blood Thinner Instructions: n/a Aspirin Instructions: last dose on 04/08/19  Anesthesia review: yes, heart history, allison note 6/1  Patient denies shortness of breath, fever, cough and chest pain at PAT appointment  Patient verbalized understanding of instructions that were given to them at the PAT appointment. Patient was also instructed that they will need to review over the PAT instructions again at home before surgery.   Coronavirus Screening  Have you experienced the following symptoms:  Cough yes/no: No Fever (>100.1F)  yes/no: No Runny nose yes/no: No Sore throat yes/no: No Difficulty breathing/shortness of breath  yes/no: No  Have you or a family member traveled in the last 14 days and where? yes/no: No

## 2019-04-10 NOTE — Progress Notes (Signed)
Phone [phone]   Subjective:  Virtual visit via Video note. Chief complaint: Chief Complaint  Patient presents with  . Surgical Clearance    Scheduled for [facility]e surgery 04/12/2019, [facility].     This visit type was conducted due to national recommendations for restrictions regarding the COVID-19 Pandemic (e.g. social distancing).  This format is felt to be most appropriate for this patient at this time balancing risks to patient and risks to population by having him in for in person visit.  No physical exam was performed (except for noted visual exam or audio findings with Telehealth visits).    Our team/I connected with [name] at  8:40 AM EDT by a video enabled telemedicine application ([email or web address] or caregility through epic) and verified that I am speaking with the correct person using two identifiers.  Location patient: [facility] Location provider: [facility], office Persons participating in the virtual visit:  Patient  I,Stephen Hunter,acting as a scribe for Garret Reddish, MD.,have documented all relevant documentation on the behalf of Garret Reddish, MD,as directed by  Garret Reddish, MD while in the presence of Garret Reddish, MD.  Our team/I discussed the limitations of evaluation and management by telemedicine and the availability of in person appointments. In light of current covid-19 pandemic, patient also understands that we are trying to protect them by minimizing in office contact if at all possible.  The patient expressed consent for telemedicine visit and agreed to proceed. Patient understands insurance will be billed.   ROS-   Denies HA, dizziness, visual changes, CP, new SOB.   Past Medical History-  Patient Active Problem List   Diagnosis Date Noted  . Constipation 04/21/2018    Priority: High  . Obstructive chronic bronchitis without exacerbation ([place]) 09/18/2013    Priority: High  . Chest pain 04/11/2013    Priority: High  . Hypertensive heart  disease with chronic diastolic congestive heart failure ([place])     Priority: High  . Sarcoidosis of lung ([place]) 04/10/2007    Priority: High  . Type II diabetes mellitus with renal manifestations ([place]) 08/21/2006    Priority: High  . Epiglottitis     Priority: Medium  . Osteoarthritis of right knee 01/26/2017    Priority: Medium  . CKD (chronic kidney disease) stage 3, GFR 30-59 ml/min (HCC) 04/27/2015    Priority: Medium  . History of osteomyelitis 03/27/2015    Priority: Medium  . Fatty liver 09/30/2014    Priority: Medium  . Major depression in full remission ([place])     Priority: Medium  . Gout 08/20/2010    Priority: Medium  . Anemia 09/18/2009    Priority: Medium  . Sleep apnea 04/21/2009    Priority: Medium  . Hyperlipidemia associated with type 2 diabetes mellitus ([place]) 08/21/2006    Priority: Medium  . Hypertension associated with diabetes ([place]) 08/21/2006    Priority: Medium  . Total knee replacement status, left 12/07/2017    Priority: Low  . Dysphagia 09/20/2017    Priority: Low  . Plantar fasciitis, right 07/13/2017    Priority: Low  . S/P total knee arthroplasty, right 06/29/2017    Priority: Low  . Osteoarthrosis, localized, primary, knee, right     Priority: Low  . Sprain of calcaneofibular ligament of right ankle 06/06/2017    Priority: Low  . Onychomycosis 10/27/2015    Priority: Low  . MRSA (methicillin resistant staph aureus) culture positive 03/27/2015    Priority: Low  . Hot flashes 07/15/2014  Priority: Low  . Abnormal SPEP 04/17/2014    Priority: Low  . Fracture of left leg 04/17/2014    Priority: Low  . Cushingoid side effect of steroids ([place]) 04/17/2014    Priority: Low  . Internal hemorrhoids     Priority: Low  . Preoperative clearance 03/25/2014    Priority: Low  . Solitary pulmonary nodule, on CT 02/2013 - stable over 2 years in 2015 02/20/2013    Priority: Low  . Morbid obesity ([place]) 06/04/2009    Priority: Low  . GERD 08/21/2006     Priority: Low  . Nontraumatic complete tear of left rotator cuff   . Impingement syndrome of left shoulder   . Diabetic neuropathy ([place]) 02/20/2019  . Severe persistent asthma 01/04/2019  . Subcortical microvascular ischemic occlusive disease 07/12/2018  . Rapid palpitations 07/12/2018  . Impingement of left ankle joint 06/26/2018  . Anxiety 04/05/2018    Medications- reviewed and updated Current Outpatient Medications  Medication Sig Dispense Refill  . albuterol (PROVENTIL HFA;VENTOLIN HFA) 108 (90 Base) MCG/ACT inhaler Inhale 1-2 puffs into the lungs every 6 (six) hours as needed for wheezing or shortness of breath. 8 g 5  . allopurinol (ZYLOPRIM) 100 MG tablet Take 100 mg by mouth every evening.     [name] aspirin EC 81 MG tablet Take 81 mg by mouth daily.     [name] atorvastatin (LIPITOR) 40 MG tablet TAKE 1 TAB BY MOUTH ONCE DAILY (Patient taking differently: Take 40 mg by mouth every evening. ) 90 tablet 3  . buPROPion (WELLBUTRIN XL) 300 MG 24 hr tablet TAKE 1 TABLET BY MOUTH EVERY DAY (Patient taking differently: Take 300 mg by mouth daily. ) 90 tablet 3  . carvedilol (COREG) 25 MG tablet TAKE 1 TABLET (25 MG TOTAL) BY MOUTH 2 (TWO) TIMES DAILY WITH A MEAL. 180 tablet 3  . cholecalciferol (VITAMIN D) 25 MCG (1000 UT) tablet Take 1,000 Units by mouth 2 (two) times a day.    [name] dexlansoprazole (DEXILANT) 60 MG capsule Take 1 capsule (60 mg total) by mouth daily. 30 capsule 5  . diltiazem (CARDIZEM CD) 120 MG 24 hr capsule TAKE 1 CAPSULE BY MOUTH EVERY DAY (FOR FUTURE REFILLS MAKE A DOCTORS VISIT) (Patient taking differently: Take 120 mg by mouth every evening. ) 90 capsule 3  . famotidine (PEPCID) 20 MG tablet Take 1 tablet (20 mg total) by mouth at bedtime. 30 tablet 3  . fenofibrate 160 MG tablet TAKE 1 TABLET BY MOUTH EVERY DAY (Patient taking differently: Take 160 mg by mouth every evening. ) 90 tablet 0  . fluticasone (FLONASE) 50 MCG/ACT nasal spray PLACE 2 SPRAYS INTO BOTH NOSTRILS  DAILY AS NEEDED FOR ALLERGIES 48 g 1  . fluticasone furoate-vilanterol (BREO ELLIPTA) 200-25 MCG/INH AEPB Inhale 1 puff into the lungs daily. 60 each 3  . furosemide (LASIX) 40 MG tablet TAKE 1 TABLET BY MOUTH TWICE A DAY (Patient taking differently: Take 40 mg by mouth 2 (two) times a day. ) 180 tablet 3  . gabapentin (NEURONTIN) 100 MG capsule TAKE 1 CAPSULE BY MOUTH THREE TIMES A DAY (Patient taking differently: Take 100-200 mg by mouth See admin instructions. Take 1 capsule (100 mg) by mouth in the morning & 2 capsules (200 mg) by mouth at night.) 270 capsule 3  . glucose blood (ONETOUCH VERIO) test strip 1 each by Other route 2 (two) times daily. Use as instructed 200 each 3  . HYDROcodone-acetaminophen (NORCO/VICODIN) 5-325 MG tablet Take 1 tablet  by mouth every 4 (four) hours as needed for moderate pain. 30 tablet 0  . Insulin Glargine (BASAGLAR KWIKPEN) 100 UNIT/ML SOPN Inject 0.5 mLs (50 Units total) into the skin every morning. 5 pen 2  . insulin lispro (HUMALOG KWIKPEN) 100 UNIT/ML KwikPen Inject 0.3 mLs (30 Units total) into the skin daily with supper. Take PRN, for a total of 25 units per day 5 pen 11  . Insulin Pen Needle 33G X 4 MM MISC 1 each by Does not apply route 4 (four) times daily. 200 each 11  . isosorbide mononitrate (IMDUR) 30 MG 24 hr tablet Take 1 tablet (30 mg total) by mouth at bedtime. 90 tablet 3  . KLOR-CON M20 20 MEQ tablet TAKE 1 & 1/2 TABLETS BY MOUTH TWICE A DAY (Patient taking differently: Take 30 mEq by mouth 2 (two) times a day. ) 270 tablet 2  . lactulose (CHRONULAC) 10 GM/15ML solution Take 10 g by mouth daily.     [name] linaclotide (LINZESS) 290 MCG CAPS capsule Take 1 capsule (290 mcg total) by mouth daily before breakfast. (Patient taking differently: Take 290 mcg by mouth daily as needed (constipation.). ) 30 capsule 11  . LORazepam (ATIVAN) 0.5 MG tablet Take 0.5 mg by mouth every 12 (twelve) hours as needed for anxiety.    . metFORMIN (GLUCOPHAGE) 1000 MG  tablet TAKE 1 TABLET BY MOUTH TWICE A DAY WITH MEALS (Patient taking differently: Take 1,000 mg by mouth 2 (two) times daily with a meal. ) 180 tablet 0  . naproxen (NAPROSYN) 500 MG tablet Take 1 tablet (500 mg total) by mouth 2 (two) times daily with a meal. (Patient taking differently: Take 500 mg by mouth 2 (two) times daily as needed (pain). ) 60 tablet 1  . nitroGLYCERIN (NITROSTAT) 0.4 MG SL tablet Place 1 tablet (0.4 mg total) under the tongue every 5 (five) minutes as needed for chest pain. Reported on 01/05/2016 25 tablet 6  . ondansetron (ZOFRAN-ODT) 4 MG disintegrating tablet Take 1 tablet (4 mg total) by mouth every 8 (eight) hours as needed for nausea or vomiting. 50 tablet 2  . OneTouch Delica Lancets 09W MISC 1 each by Does not apply route 2 (two) times a day. E11.29 100 each 2  . PARoxetine Mesylate (BRISDELLE) 7.5 MG CAPS Take 7.5 mg by mouth daily.     . predniSONE (DELTASONE) 5 MG tablet Take 5 mg by mouth daily with breakfast.    . telmisartan (MICARDIS) 20 MG tablet TAKE 1 TABLET BY MOUTH EVERY DAY 90 tablet 1  . venlafaxine XR (EFFEXOR-XR) 75 MG 24 hr capsule Take 1 capsule (75 mg total) by mouth 2 (two) times daily. (Patient taking differently: Take 75 mg by mouth every evening. ) 180 capsule 3   No current facility-administered medications for this visit.      Objective:  BP (!) 131/59   Pulse 71   Temp (!) 97.4 F (36.3 C)   Ht 5\' 6"  (1.676 m)   Wt 249 lb (112.9 kg)   BMI 40.19 kg/m  self reported vitals Gen: NAD, resting comfortably Lungs: nonlabored, normal respiratory rate  Skin: appears dry, no obvious rash    Assessment and Plan   # Surgical Clearance- diastolic CHF, pulmonary sarcoidosis, diabetes S: Patient has left forefoot reconstructive surgery in December now with plans for right forefoot surgery including hammertoe  Patient was evaluated by cardiology before December visit and cleared by Dr. Ellyn Hack. Ischemic eval 03/11/2017 by Dr. Burt Knack with the  following results "1. Angiographically normal left main, RCA, and LCx 2. Minimal nonobstructive irregularity in the LAD 3. Normal LVEDP"    She has history of diastolic dysfunction/diastolic CHF given continued lasix need- though Dr. Ellyn Hack believes this is primarily diastolic dysfunction and respiratory issues are more pulmonary based. She states has had no change in shortness of breath since that evaluation- actually states she feels about the best she has felt in years from breathing perspective which is encouraging. In December from Dr. Ellyn Hack "From a cardiac standpoint would be low risk low risk surgery, however from a pulmonary standpoint may be more concerned." though he did state based on diabetes alone would be more low to intermediate risk. She was also evaluated by Dr. Casper Harrison of pulmonology at [facility] at that time for pulmonary sarcoidosis and cleared.   From endocrine perspective at last surgery "[name] cleared from the standpoint of the diabetes The night before and the night of, subtract 10 units from your usual amount of toujeo. On the day of surgery, skip novolog until you are eating again.   Then take just half the prescribed amount of novolog for the rest of the day.  Call if questions or problems" Lab Results  Component Value Date   HGBA1C 8.2 (H) 03/12/2019  A1c slightly elevated at 8.2 on last check but under 8.5 I think reasonable to proceed- gave her same precautions for this visit. Managed by Dr. Loanne Drilling. Currently taking Basaglar, Humalog, and Metformin 1000 mg BID. Taking Gabapentin for neuropathy.  CBGs- Highest BG has been was 150, FBG lower than that. Denies hypoglycemic episodes.    A/P:  Medically maximized for surgery- filled out H&P for her to proceed diastolic CHF- stable, continue lasix pulmonary sarcoidosis/obstructive chronic bronchitis- has been stable, continue current meds diabetes - continue current meds but cut basaglar by 10 units night before surgery, avoid  novolog until eating and then only take half for a day once eating.   #hypertension/CKD stage III S: controlled on Coreg 25 mg BID, Imdur 30 mg, Diltiazem XR 120 mg, Lasix 40 mg BID, and Telmisartan 20 mg. BP has been consistently lower than 140/90.   GFR recently checked in high 50s noted BP Readings from Last 3 Encounters:  04/10/19 (!) 131/59  03/14/19 128/61  03/12/19 (!) 142/58  A/P: Stable. Continue current medications.  We will plan on getting at least BMP or CMP next visit.  #hyperlipidemia S:  controlled on Atorvastatin 40 mg and Fenofibrate 160 mg daily.  Lab Results  Component Value Date   CHOL 163 03/10/2017   HDL 35 (L) 03/10/2017   LDLCALC 49 03/10/2017   LDLDIRECT 76.0 01/15/2016   TRIG 197 (H) 09/20/2017   CHOLHDL 4.7 03/10/2017   A/P: needs updated lipid panel- we discussed possible sept/october CPE  # Depression S: Currently taking Wellbutrin 300 mg, Paroxetine 7.5 mg and Effexor 75 mg BID.  Depression screen [facility] 2/9 04/10/2019  Decreased Interest 0  Down, Depressed, Hopeless 0  PHQ - 2 Score 0  Altered sleeping 0  Tired, decreased energy 0  Change in appetite 0  Feeling bad or failure about yourself  0  Trouble concentrating 0  Moving slowly or fidgety/restless 0  Suicidal thoughts 0  PHQ-9 Score 0  Difficult doing work/chores Not difficult at all  Some recent data might be hidden  A/P: well controlled- continue current meds- full remission  Sept or oct CPE- if cannot schedule at that time advised regular follow up then CPE  6 months out (tryign to keep her out of office during cold/flu season Future Appointments  Date Time Provider [place]  04/10/2019 10:00 AM [facility] PAT 2 [facility] None  04/11/2019  8:30 AM Suzan Slick, NP [facility] None  04/16/2019 11:00 AM Renato Shin, MD [facility] None  04/20/2019  9:40 AM [facility] MM 3 [facility]  06/25/2019  9:20 AM Leonie Man, MD [facility]   Lab/Order associations:    ICD-10-CM   1. Type 2 diabetes mellitus with other diabetic kidney complication, with long-term current use of insulin (HCC)  E11.29    Z79.4   2. Sarcoidosis of lung ([place])  D86.0   3. Obstructive chronic bronchitis without exacerbation ([place])  J44.9   4. Hypertensive heart disease with chronic diastolic congestive heart failure (HCC)  I11.0    I50.32   5. Hypertension associated with diabetes ([place])  E11.59    I10   6. Hyperlipidemia associated with type 2 diabetes mellitus (HCC)  E11.69    E78.5   7. Recurrent major depressive disorder, in full remission ([place])  F33.42   8. CKD (chronic kidney disease) stage 3, GFR 30-59 ml/min (HCC)  N18.3    Return precautions advised.  Garret Reddish, MD

## 2019-04-10 NOTE — Patient Instructions (Addendum)
There are no preventive care reminders to display for this patient.  Depression screen [facility] 2/9 04/10/2019 01/04/2019 02/20/2018  Decreased Interest 0 1 0  Down, Depressed, Hopeless 0 0 1  PHQ - 2 Score 0 1 1  Altered sleeping 0 1 1  Tired, decreased energy 0 1 1  Change in appetite 0 0 0  Feeling bad or failure about yourself  0 0 0  Trouble concentrating 0 0 0  Moving slowly or fidgety/restless 0 0 0  Suicidal thoughts 0 0 0  PHQ-9 Score 0 3 3  Difficult doing work/chores Not difficult at all Somewhat difficult Somewhat difficult  Some recent data might be hidden

## 2019-04-11 ENCOUNTER — Other Ambulatory Visit: Payer: Self-pay

## 2019-04-11 ENCOUNTER — Ambulatory Visit (INDEPENDENT_AMBULATORY_CARE_PROVIDER_SITE_OTHER): Payer: 59 | Admitting: Family

## 2019-04-11 ENCOUNTER — Encounter: Payer: Self-pay | Admitting: Family

## 2019-04-11 VITALS — Ht 66.0 in | Wt 256.0 lb

## 2019-04-11 DIAGNOSIS — M75122 Complete rotator cuff tear or rupture of left shoulder, not specified as traumatic: Secondary | ICD-10-CM

## 2019-04-11 MED ORDER — DEXTROSE 5 % IV SOLN
3.0000 g | INTRAVENOUS | Status: AC
  Administered 2019-04-12: 3 g via INTRAVENOUS
  Filled 2019-04-11: qty 3000
  Filled 2019-04-11: qty 3

## 2019-04-11 NOTE — Progress Notes (Signed)
Post-Op Visit Note   Patient: [name]           Date of Birth: [birth year]           MRN: [id number] Visit Date: 04/11/2019 PCP: [name], MD  Chief Complaint:  Chief Complaint  Patient presents with  . Left Shoulder - Routine Post Op    03/14/19 left shoulder scope     HPI:  HPI The patient is a 58 year old woman seen status post left shoulder arthroscopy. Doing well. Has gained full rom, this is painless. No concerns today.  Ortho Exam Portals are well healed. No erythema or odor. Mild ecchymosis. Full active rom.   Visit Diagnoses:  No diagnosis found.  Plan: patient pleased with results. Mild biceps pain, intermittently. If no resolution in 4 weeks will return. Follow-Up Instructions: No follow-ups on file.   Imaging: No results found.  Orders:  No orders of the defined types were placed in this encounter.  No orders of the defined types were placed in this encounter.    PMFS History: Patient Active Problem List   Diagnosis Date Noted  . Nontraumatic complete tear of left rotator cuff   . Impingement syndrome of left shoulder   . Diabetic neuropathy ([place]) 02/20/2019  . Severe persistent asthma 01/04/2019  . Subcortical microvascular ischemic occlusive disease 07/12/2018  . Rapid palpitations 07/12/2018  . Impingement of left ankle joint 06/26/2018  . Constipation 04/21/2018  . Anxiety 04/05/2018  . Total knee replacement status, left 12/07/2017  . Dysphagia 09/20/2017  . Epiglottitis   . Plantar fasciitis, right 07/13/2017  . S/P total knee arthroplasty, right 06/29/2017  . Osteoarthrosis, localized, primary, knee, right   . Sprain of calcaneofibular ligament of right ankle 06/06/2017  . Osteoarthritis of right knee 01/26/2017  . Onychomycosis 10/27/2015  . CKD (chronic kidney disease) stage 3, GFR 30-59 ml/min (HCC) 04/27/2015  . MRSA (methicillin resistant staph aureus) culture positive 03/27/2015  . History of osteomyelitis  03/27/2015  . Fatty liver 09/30/2014  . Hot flashes 07/15/2014  . Abnormal SPEP 04/17/2014  . Fracture of left leg 04/17/2014  . Cushingoid side effect of steroids ([place]) 04/17/2014  . Internal hemorrhoids   . Major depression in full remission ([place])   . Preoperative clearance 03/25/2014  . Obstructive chronic bronchitis without exacerbation ([place]) 09/18/2013  . Chest pain 04/11/2013  . Hypertensive heart disease with chronic diastolic congestive heart failure ([place])   . Solitary pulmonary nodule, on CT 02/2013 - stable over 2 years in 2015 02/20/2013  . Gout 08/20/2010  . Anemia 09/18/2009  . Morbid obesity ([place]) 06/04/2009  . Sleep apnea 04/21/2009  . Sarcoidosis of lung ([place]) 04/10/2007  . Hyperlipidemia associated with type 2 diabetes mellitus ([place]) 08/21/2006  . Hypertension associated with diabetes ([place]) 08/21/2006  . GERD 08/21/2006  . Type II diabetes mellitus with renal manifestations ([place]) 08/21/2006   Past Medical History:  Diagnosis Date  . Abnormal SPEP 04/17/2014  . Acute left ankle pain 01/26/2017  . ANEMIA-UNSPECIFIED 09/18/2009  . Anxiety   . Arthritis   . Chronic diastolic heart failure, NYHA class 2 (HCC)    Normal LVEDP by May 2018  . COPD (chronic obstructive pulmonary disease) ([place])   . Depression   . DIABETES MELLITUS, TYPE II 08/21/2006  . Diabetic osteomyelitis ([place]) 05/29/2015  . Fracture of 5th metatarsal    non union  . GERD 37/62/8315   Had fundoplication  . GOUT 08/20/2010  . Hx of  umbilical hernia repair   . HYPERLIPIDEMIA 08/21/2006  . HYPERTENSION 08/21/2006  . Infection of wound due to methicillin resistant Staphylococcus aureus (MRSA)   . Internal hemorrhoids   . Multiple allergies 10/14/2016  . OBESITY 06/04/2009  . Onychomycosis 10/27/2015  . Osteomyelitis of left foot ([place]) 05/29/2015  . Pulmonary sarcoidosis ([place])    Followed locally by pulmonology, but also by Dr. Casper Harrison at [facility]  . Right knee pain 01/26/2017  . Vocal  cord dysfunction   . Wears partial dentures     Family History  Problem Relation Age of Onset  . Diabetes Father   . Heart attack Father 30  . Coronary artery disease Father   . Heart failure Father   . Throat cancer Father   . COPD Mother   . Emphysema Mother   . Asthma Mother   . Heart failure Mother   . Breast cancer Mother   . Heart attack Maternal Grandfather   . Sarcoidosis Maternal Uncle   . Lung cancer Brother   . Diabetes Brother   . Colon cancer Neg Hx   . Rectal cancer Neg Hx     Past Surgical History:  Procedure Laterality Date  . ABDOMINAL HYSTERECTOMY    . APPENDECTOMY    . BLADDER SUSPENSION  11/11/2011   Procedure: TRANSVAGINAL TAPE (TVT) PROCEDURE;  Surgeon: Olga Millers, MD;  Location: [place] [place] [facility];  Service: Gynecology;  Laterality: N/A;  . CAROTIDS  02/18/11   CAROTID DUPLEX; VERTEBRALS ARE PATENT WITH ANTEGRADE FLOW. ICA/CCA RATIO 1.61 ON RIGHT AND 0.75 ON LEFT  . CHOLECYSTECTOMY  1984  . CYSTOSCOPY  11/11/2011   Procedure: CYSTOSCOPY;  Surgeon: Olga Millers, MD;  Location: [facility];  Service: Gynecology;  Laterality: N/A;  . EXTUBATION (ENDOTRACHEAL) IN OR N/A 09/23/2017   Procedure: EXTUBATION (ENDOTRACHEAL) IN OR;  Surgeon: Helayne Seminole, MD;  Location: [place];  Service: ENT;  Laterality: N/A;  . FIBEROPTIC LARYNGOSCOPY AND TRACHEOSCOPY N/A 09/23/2017   Procedure: FLEXIBLE FIBEROPTIC LARYNGOSCOPY;  Surgeon: Helayne Seminole, MD;  Location: [place];  Service: ENT;  Laterality: N/A;  . FRACTURE SURGERY     foot  . HALLUX FUSION Left 10/02/2018   Procedure: HALLUX ARTHRODESIS;  Surgeon: Edrick Kins, DPM;  [facility];  Service: Podiatry;  Laterality: Left;  . HAMMER TOE SURGERY Left 10/02/2018   Procedure: HAMMER TOE CORRECTION 2ND 3RD 4RD FIFTH TOE;  Surgeon: Edrick Kins, DPM;  [facility];  Service: Podiatry;  Laterality: Left;  . HERNIA REPAIR    . I&D EXTREMITY Left 06/27/2015   Procedure: Partial Excision Left Calcaneus, Place  Antibiotic Beads, and Wound VAC;  Surgeon: Newt Minion, MD;  Location: [place];  Service: Orthopedics;  Laterality: Left;  . KNEE ARTHROSCOPY     right  . LEFT AND RIGHT HEART CATHETERIZATION WITH CORONARY ANGIOGRAM N/A 04/23/2013   Procedure: LEFT AND RIGHT HEART CATHETERIZATION WITH CORONARY ANGIOGRAM;  Surgeon: Leonie Man, MD;  Location[facility];  Service: Cardiovascular;  Laterality: N/A;  . LEFT HEART CATH AND CORONARY ANGIOGRAPHY N/A 03/11/2017   Procedure: Left Heart Cath and Coronary Angiography;  Surgeon: Sherren Mocha, MD;  Location: [place] [facility]; angiographically minimal CAD in the LAD otherwise normal.;  Normal LVEDP.  FALSE POSITIVE MYOVIEW  . LEFT HEART CATH AND CORONARY ANGIOGRAPHY  07/20/2010   LVEF 50-55% WITH VERY MILD GLOBAL HYPOKINESIA; ESSENTIALLY NORMAL CORONARY ARTERIES; NORMAL LV FUNCTION  . METATARSAL OSTEOTOMY WITH OPEN  REDUCTION INTERNAL FIXATION (ORIF) METATARSAL WITH FUSION Left 04/09/2014   Procedure: LEFT FOOT FRACTURE OPEN TREATMENT METATARSAL INCLUDES INTERNAL FIXATION EACH;  Surgeon: Lorn Junes, MD;  Location: [place];  Service: Orthopedics;  Laterality: Left;  . NISSEN FUNDOPLICATION  7412  . NM MYOVIEW LTD  03/09/2013   Lexiscan --> EF 50%; NORMAL MYOCARDIAL PERFUSION STUDY - breast attenuation  . NM MYOVIEW LTD  03/10/2017   : Moderate size "stress-induced "perfusion defect at the apex as well as "ill-defined stress-induced perfusion defect in the lateral wall.  EF 55%.  INTERMEDIATE risk. -->  FALSE POSITIVE  . Right and left CARDIAC CATHETERIZATION  04/23/2013   Angiographic normal coronaries; LVEDP 20 mmHg, PCWP 12-14 mmHg, RAP 12 mmHg.; Fick CO/CI 4.9/2.2  . SHOULDER ARTHROSCOPY Left 03/14/2019   Procedure: LEFT SHOULDER ARTHROSCOPY, DEBRIDEMENT, AND DECOMPRESSION;  Surgeon: Newt Minion, MD;  Location: [place];  Service: Orthopedics;  Laterality: Left;  . TOTAL KNEE ARTHROPLASTY Right 06/29/2017   Procedure: RIGHT TOTAL  KNEE ARTHROPLASTY;  Surgeon: Newt Minion, MD;  Location: [place];  Service: Orthopedics;  Laterality: Right;  . TOTAL KNEE ARTHROPLASTY Left 12/07/2017  . TOTAL KNEE ARTHROPLASTY Left 12/07/2017   Procedure: LEFT TOTAL KNEE ARTHROPLASTY;  Surgeon: Newt Minion, MD;  Location: [place];  Service: Orthopedics;  Laterality: Left;  . TRACHEOSTOMY TUBE PLACEMENT N/A 09/20/2017   Procedure: AWAKE INTUBATION WITH ANESTHESIA WITH VIDEO ASSISTANCE;  Surgeon: Helayne Seminole, MD;  Location: [place] [facility];  Service: ENT;  Laterality: N/A;  . TRANSTHORACIC ECHOCARDIOGRAM  08/2014   Normal LV size and function.  Mild LVH.  EF 55-60%.  Normal regional wall motion.  GR 1 DD.  Normal RV size and function .  [name] TUBAL LIGATION     with reversal in 1994  . VENTRAL HERNIA REPAIR     Social History   Occupational History  . Occupation: DISABLED  Tobacco Use  . Smoking status: Never Smoker  . Smokeless tobacco: Never Used  Substance and Sexual Activity  . Alcohol use: No    Alcohol/week: 0.0 standard drinks  . Drug use: No  . Sexual activity: Yes    Birth control/protection: Surgical

## 2019-04-11 NOTE — Progress Notes (Signed)
Anesthesia Chart Review:  Case: [id number] Date/Time: 04/12/19 1415   Procedure: HAMMER TOE CORRECTION, 2ND, 3RD, 4TH AND 5TH TOES OF RIGHT FOOT (Right )   Anesthesia type: General   Pre-op diagnosis: HAMMER TOE   Location: [facility] OR ROOM 05 / [place] [facility]   Surgeon: Edrick Kins, DPM      DISCUSSION: DISCUSSION: Patient is a 58 year old female scheduled for the above procedure. She is s/p left shoulder arthroscopy, debridement and decompression for rotator cuff tear on 03/14/19. She was evaluated by her PCP Dr. Yong Channel on 04/10/19 for H&P/preoperative evaluation. He felt patient was "Medically maximized for surgery."  History includes never smoker, chronic diastolic CHF, HTN, DM2, hyperlipidemia, COPD, pulmonary sarcoidosis, vocal cord dysfunction, anemia, GERD, epiglottitis (09/2017, details below). BMI is consistent with morbid obesity.   She had received preoperative input from primary care, cardiology, endocrinology, and pulmonology (see below).  - Hospitalized 12/11-12/18/18 for epiglottitis, supraglottitis, and acute hypoxemic respiratory failure.Presented with worsening sore throat x 2 days. Strep negative. She requiredawakefiberoptic intubation through right nare with 6.5 ETT.She remained intubated until 09/23/17 when she was taken back to the operating room and extubated under TFl examination over a Cook catheter. She did well after extubation and was transferred to a step down unit on 09/24/17.  Presurgical COVID test negative on 04/09/19. Based on currently available information, I would anticipate that she can proceed as planned if no acute changes. She is on prednisone 5 mg daily.    VS: BP (!) 152/59   Pulse 67   Temp 36.6 C (Oral)   Resp 20   Ht 5\' 6"  (1.676 m)   Wt 116.1 kg   SpO2 99%   BMI 41.32 kg/m   PROVIDERS: - [name], MD is PCP. Last visit 04/10/19.  [name], MD is cardiologist. Last visit 09/16/18 for follow-up and preoperative evaluation (hammer  toe surgery). He felt she was stable from a cardiac and pulmonary standpoint at that time, "low to moderate" risk. More recently, preoperative input for hammer toe surgery as outlined by Fabian Sharp, NP on 02/20/19: Molinda Bailiff Courtneywas last seen on 12/4/19by Dr. Ellyn Hack. Since that day, Jadence Kinlaw Courtneyhas done well.Her mobility is limited by her pulmonary issues secondary to sarcoidosis. She denies any new cardiac symptoms, including angina. Recent heart monitor negative for atrial arrhythmias. Heart catheterization in 2018 with minimal nonobstructive disease. Per Dr. Ellyn Hack: [name] to hold aspirin as needed for procedure. Therefore, based on ACC/AHA guidelines, the patient would be at acceptable risk for the planned procedure without further cardiovascular testing."   - Renato Shin, MD is endocrinologist. Last visit 02/12/19. Preoperative input for hammer toe surgery as outlined on 02/16/19:  "Her surgical risk is low and outweighed by the potential benefit of the surgery. She is therefore medically cleared. Please note this clearance is from the standpoint of the diabetes only. Also, pt should skip insulin the morning of the surgery. She should resume usual when she is eating again."  - Loreta Ave, MD is pulmonologist ([facility]). Last visit 02/20/19. Severe asthma felt to have fair control on Breo and rescue inhalers. Still on steroids for Sarcoidosis. He cleared her for hammer toe surgery at that time. (She was also seen locally on 07/25/17 by Marshell Garfinkel, MD for preoperative evaluation.)   Lind Guest, MD is ENT ([place]). Supraglottitis and oropharyngeal dysphagia from 09/2017 felt related to infection and not mass/malignancy. Office transnasal fiberoptic laryngoscopy did not show any suspicious  findings. PRN follow-up after 1/2/219 visit.   LABS: Preoperative labs noted. A1c 8.2 on 03/12/19, unchanged from 08/25/18. Reports fasting CBGs ~  140-150's. (all labs ordered are listed, but only abnormal results are displayed)  Labs Reviewed  SURGICAL PCR SCREEN - Abnormal; Notable for the following components:      Result Value   Staphylococcus aureus POSITIVE (*)    All other components within normal limits  GLUCOSE, CAPILLARY - Abnormal; Notable for the following components:   Glucose-Capillary 187 (*)    All other components within normal limits  CBC - Abnormal; Notable for the following components:   RBC 3.76 (*)    Hemoglobin 10.2 (*)    HCT 33.3 (*)    All other components within normal limits  BASIC METABOLIC PANEL - Abnormal; Notable for the following components:   Glucose, Bld 206 (*)    All other components within normal limits    IMAGES: CXR 08/23/18: IMPRESSION: No active cardiopulmonary disease.   EKG: 07/12/18: NSR, cannot rule out anterior infarct (age undetermined)   CV: Cardiac Event Monitor 07/20/18-08/18/18:  Mostly sinus rhythm: Rates 39 to 220 bpm. Average 79 bpm.   Roughly 2% PVCs and less than 1% PACs.   No arrhythmias: No A. fib, PAT, MAT, SVT or VT.   Relatively normal study with occasional PVCs.   Cardiac cath 03/11/17 (done for intermediate risk stress test): Conclusion: 1. Angiographically normal left main, RCA, and LCx 2. Minimal nonobstructive irregularity in the LAD 3. Normal LVEDP  Nuclear stress test 03/10/17: Impression: 1. There is a moderate-sized stress-induced perfusion defect at the apex as well as ill-defined stress-induced perfusion defect in the lateral. 2. Normal left ventricular wall motion. 3. Left ventricular ejection fraction 55% 4. Non invasive risk stratification: Intermediate  Echo 09/02/14: Study Conclusions: - Left ventricle: The cavity size was normal. Wall thickness wasincreased in a pattern of mild LVH. Systolic function was normal.The estimated ejection fraction was in the range of 55% to 60%.Wall motion was normal; there were no regional  wall motionabnormalities. Doppler parameters are consistent with abnormalleft ventricular relaxation (grade 1 diastolic dysfunction). - Aortic valve: There was no stenosis. - Mitral valve: Mildly calcified annulus. There was no significantregurgitation. - Left atrium: The atrium was mildly dilated. - Right ventricle: The cavity size was normal. Systolic functionwas normal. - Inferior vena cava: The vessel was normal in size. The respirophasic diameter changes were in the normal range (>= 50%),consistent with normal central venous pressure. Impressions: - Normal LV size with mild LV hypertrophy. EF 55-60%. Normal RV size and systolic function. No significant valvular abnormalities.   Past Medical History:  Diagnosis Date  . Abnormal SPEP 04/17/2014  . Acute left ankle pain 01/26/2017  . ANEMIA-UNSPECIFIED 09/18/2009  . Anxiety   . Arthritis   . Chronic diastolic heart failure, NYHA class 2 (HCC)    Normal LVEDP by May 2018  . COPD (chronic obstructive pulmonary disease) ([place])   . Depression   . DIABETES MELLITUS, TYPE II 08/21/2006  . Diabetic osteomyelitis ([place]) 05/29/2015  . Fracture of 5th metatarsal    non union  . GERD 04/03/7627   Had fundoplication  . GOUT 08/20/2010  . Hx of umbilical hernia repair   . HYPERLIPIDEMIA 08/21/2006  . HYPERTENSION 08/21/2006  . Infection of wound due to methicillin resistant Staphylococcus aureus (MRSA)   . Internal hemorrhoids   . Multiple allergies 10/14/2016  . OBESITY 06/04/2009  . Onychomycosis 10/27/2015  . Osteomyelitis of left foot ([place]) 05/29/2015  .  Pulmonary sarcoidosis ([place])    Followed locally by pulmonology, but also by Dr. Casper Harrison at [facility]  . Right knee pain 01/26/2017  . Vocal cord dysfunction   . Wears partial dentures     Past Surgical History:  Procedure Laterality Date  . ABDOMINAL HYSTERECTOMY    . APPENDECTOMY    . BLADDER SUSPENSION  11/11/2011   Procedure: TRANSVAGINAL TAPE (TVT) PROCEDURE;   Surgeon: Olga Millers, MD;  Location: [facility];  Service: Gynecology;  Laterality: N/A;  . CAROTIDS  02/18/11   CAROTID DUPLEX; VERTEBRALS ARE PATENT WITH ANTEGRADE FLOW. ICA/CCA RATIO 1.61 ON RIGHT AND 0.75 ON LEFT  . CHOLECYSTECTOMY  1984  . CYSTOSCOPY  11/11/2011   Procedure: CYSTOSCOPY;  Surgeon: Olga Millers, MD;  Location: [facility];  Service: Gynecology;  Laterality: N/A;  . EXTUBATION (ENDOTRACHEAL) IN OR N/A 09/23/2017   Procedure: EXTUBATION (ENDOTRACHEAL) IN OR;  Surgeon: Helayne Seminole, MD;  Location: [place];  Service: ENT;  Laterality: N/A;  . FIBEROPTIC LARYNGOSCOPY AND TRACHEOSCOPY N/A 09/23/2017   Procedure: FLEXIBLE FIBEROPTIC LARYNGOSCOPY;  Surgeon: Helayne Seminole, MD;  Location: [place];  Service: ENT;  Laterality: N/A;  . FRACTURE SURGERY     foot  . HALLUX FUSION Left 10/02/2018   Procedure: HALLUX ARTHRODESIS;  Surgeon: Edrick Kins, DPM;  [facility];  Service: Podiatry;  Laterality: Left;  . HAMMER TOE SURGERY Left 10/02/2018   Procedure: HAMMER TOE CORRECTION 2ND 3RD 4RD FIFTH TOE;  Surgeon: Edrick Kins, DPM;  [facility];  Service: Podiatry;  Laterality: Left;  . HERNIA REPAIR    . I&D EXTREMITY Left 06/27/2015   Procedure: Partial Excision Left Calcaneus, Place Antibiotic Beads, and Wound VAC;  Surgeon: Newt Minion, MD;  Location: [place];  Service: Orthopedics;  Laterality: Left;  . KNEE ARTHROSCOPY     right  . LEFT AND RIGHT HEART CATHETERIZATION WITH CORONARY ANGIOGRAM N/A 04/23/2013   Procedure: LEFT AND RIGHT HEART CATHETERIZATION WITH CORONARY ANGIOGRAM;  Surgeon: Leonie Man, MD;  Location: [facility] CATH LAB;  Service: Cardiovascular;  Laterality: N/A;  . LEFT HEART CATH AND CORONARY ANGIOGRAPHY N/A 03/11/2017   Procedure: Left Heart Cath and Coronary Angiography;  Surgeon: Sherren Mocha, MD;  Location: [place] [facility] LAB; angiographically minimal CAD in the LAD otherwise normal.;  Normal LVEDP.  FALSE POSITIVE MYOVIEW  . LEFT HEART CATH  AND CORONARY ANGIOGRAPHY  07/20/2010   LVEF 50-55% WITH VERY MILD GLOBAL HYPOKINESIA; ESSENTIALLY NORMAL CORONARY ARTERIES; NORMAL LV FUNCTION  . METATARSAL OSTEOTOMY WITH OPEN REDUCTION INTERNAL FIXATION (ORIF) METATARSAL WITH FUSION Left 04/09/2014   Procedure: LEFT FOOT FRACTURE OPEN TREATMENT METATARSAL INCLUDES INTERNAL FIXATION EACH;  Surgeon: Lorn Junes, MD;  Location: [place];  Service: Orthopedics;  Laterality: Left;  . NISSEN FUNDOPLICATION  1610  . NM MYOVIEW LTD  03/09/2013   Lexiscan --> EF 50%; NORMAL MYOCARDIAL PERFUSION STUDY - breast attenuation  . NM MYOVIEW LTD  03/10/2017   : Moderate size "stress-induced "perfusion defect at the apex as well as "ill-defined stress-induced perfusion defect in the lateral wall.  EF 55%.  INTERMEDIATE risk. -->  FALSE POSITIVE  . Right and left CARDIAC CATHETERIZATION  04/23/2013   Angiographic normal coronaries; LVEDP 20 mmHg, PCWP 12-14 mmHg, RAP 12 mmHg.; Fick CO/CI 4.9/2.2  . SHOULDER ARTHROSCOPY Left 03/14/2019   Procedure: LEFT SHOULDER ARTHROSCOPY, DEBRIDEMENT, AND DECOMPRESSION;  Surgeon: Newt Minion, MD;  Location: [place];  Service: Orthopedics;  Laterality: Left;  . TOTAL KNEE ARTHROPLASTY Right 06/29/2017   Procedure: RIGHT TOTAL KNEE ARTHROPLASTY;  Surgeon: Newt Minion, MD;  Location: [place];  Service: Orthopedics;  Laterality: Right;  . TOTAL KNEE ARTHROPLASTY Left 12/07/2017  . TOTAL KNEE ARTHROPLASTY Left 12/07/2017   Procedure: LEFT TOTAL KNEE ARTHROPLASTY;  Surgeon: Newt Minion, MD;  Location: [place];  Service: Orthopedics;  Laterality: Left;  . TRACHEOSTOMY TUBE PLACEMENT N/A 09/20/2017   Procedure: AWAKE INTUBATION WITH ANESTHESIA WITH VIDEO ASSISTANCE;  Surgeon: Helayne Seminole, MD;  Location: [place] [facility];  Service: ENT;  Laterality: N/A;  . TRANSTHORACIC ECHOCARDIOGRAM  08/2014   Normal LV size and function.  Mild LVH.  EF 55-60%.  Normal regional wall motion.  GR 1 DD.  Normal RV size and function .   [name] TUBAL LIGATION     with reversal in 1994  . VENTRAL HERNIA REPAIR      MEDICATIONS: . albuterol (PROVENTIL HFA;VENTOLIN HFA) 108 (90 Base) MCG/ACT inhaler  . allopurinol (ZYLOPRIM) 100 MG tablet  . aspirin EC 81 MG tablet  . atorvastatin (LIPITOR) 40 MG tablet  . buPROPion (WELLBUTRIN XL) 300 MG 24 hr tablet  . carvedilol (COREG) 25 MG tablet  . cholecalciferol (VITAMIN D) 25 MCG (1000 UT) tablet  . dexlansoprazole (DEXILANT) 60 MG capsule  . diltiazem (CARDIZEM CD) 120 MG 24 hr capsule  . famotidine (PEPCID) 20 MG tablet  . fenofibrate 160 MG tablet  . fluticasone (FLONASE) 50 MCG/ACT nasal spray  . fluticasone furoate-vilanterol (BREO ELLIPTA) 200-25 MCG/INH AEPB  . furosemide (LASIX) 40 MG tablet  . gabapentin (NEURONTIN) 100 MG capsule  . glucose blood (ONETOUCH VERIO) test strip  . HYDROcodone-acetaminophen (NORCO/VICODIN) 5-325 MG tablet  . Insulin Glargine (BASAGLAR KWIKPEN) 100 UNIT/ML SOPN  . insulin lispro (HUMALOG KWIKPEN) 100 UNIT/ML KwikPen  . Insulin Pen Needle 33G X 4 MM MISC  . isosorbide mononitrate (IMDUR) 30 MG 24 hr tablet  . KLOR-CON M20 20 MEQ tablet  . lactulose (CHRONULAC) 10 GM/15ML solution  . linaclotide (LINZESS) 290 MCG CAPS capsule  . LORazepam (ATIVAN) 0.5 MG tablet  . metFORMIN (GLUCOPHAGE) 1000 MG tablet  . naproxen (NAPROSYN) 500 MG tablet  . nitroGLYCERIN (NITROSTAT) 0.4 MG SL tablet  . ondansetron (ZOFRAN-ODT) 4 MG disintegrating tablet  . OneTouch Delica Lancets 29J MISC  . PARoxetine Mesylate (BRISDELLE) 7.5 MG CAPS  . predniSONE (DELTASONE) 5 MG tablet  . telmisartan (MICARDIS) 20 MG tablet  . venlafaxine XR (EFFEXOR-XR) 75 MG 24 hr capsule   No current facility-administered medications for this encounter.    Derrill Memo ON 04/12/2019] ceFAZolin (ANCEF) 3 g in dextrose 5 % 50 mL IVPB  Last ASA 04/08/19.   Myra Gianotti, PA-C Surgical Short Stay/Anesthesiology [facility] Phone [phone] [facility] Phone [phone] 04/11/2019 9:55  AM

## 2019-04-11 NOTE — Telephone Encounter (Signed)
[facility] cancelled my authorization request for the surgery date of 04/12/2019.  I called and spoke to [facility]. She stated the case had already been approved previously for Mar 01, 2019.  I informed her that the patient did not have the surgery then.  I asked her what the valid dates were for the approval.  She said it was just for that date.  I asked her if she could change the approval date to 04/12/2019.  She informed me that she successfully changed the date to 04/12/2019.  The Authorization # is [id number].  The call reference # - [id number].

## 2019-04-11 NOTE — Anesthesia Preprocedure Evaluation (Addendum)
Anesthesia Evaluation  Patient identified by MRN, date of birth, ID band Patient awake    Reviewed: Allergy & Precautions, NPO status , Patient's Chart, lab work & pertinent test results  Airway Mallampati: II  TM Distance: >3 FB Neck ROM: Full    Dental no notable dental hx. (+) Edentulous Upper, Edentulous Lower, Dental Advisory Given   Pulmonary asthma , sleep apnea , COPD,  Pulmonary fibrosis   Pulmonary exam normal breath sounds clear to auscultation       Cardiovascular hypertension, Pt. on medications negative cardio ROS Normal cardiovascular exam Rhythm:Regular Rate:Normal     Neuro/Psych Anxiety Depression negative neurological ROS  negative psych ROS   GI/Hepatic Neg liver ROS, GERD  ,  Endo/Other  diabetes, Type 2Morbid obesity  Renal/GU negative Renal ROS  negative genitourinary   Musculoskeletal negative musculoskeletal ROS (+)   Abdominal (+) + obese,   Peds negative pediatric ROS (+)  Hematology negative hematology ROS (+)   Anesthesia Other Findings   Reproductive/Obstetrics negative OB ROS                           Anesthesia Physical Anesthesia Plan  ASA: III  Anesthesia Plan: General   Post-op Pain Management:    Induction: Intravenous  PONV Risk Score and Plan: 3 and Ondansetron, Dexamethasone, Midazolam and Treatment may vary due to age or medical condition  Airway Management Planned: LMA  Additional Equipment:   Intra-op Plan:   Post-operative Plan: Extubation in OR  Informed Consent: I have reviewed the patients History and Physical, chart, labs and discussed the procedure including the risks, benefits and alternatives for the proposed anesthesia with the patient or authorized representative who has indicated his/her understanding and acceptance.     Dental advisory given  Plan Discussed with: CRNA  Anesthesia Plan Comments: (PAT note written  04/11/2019 by Myra Gianotti, PA-C. )       Anesthesia Quick Evaluation

## 2019-04-12 ENCOUNTER — Ambulatory Visit (HOSPITAL_COMMUNITY)
Admission: RE | Admit: 2019-04-12 | Discharge: 2019-04-12 | Disposition: A | Discharge status: Home / Self Care | Payer: 59 | Attending: Podiatry | Admitting: Podiatry

## 2019-04-12 ENCOUNTER — Encounter (HOSPITAL_COMMUNITY): Payer: Self-pay

## 2019-04-12 ENCOUNTER — Ambulatory Visit (HOSPITAL_COMMUNITY): Payer: 59 | Admitting: Physician Assistant

## 2019-04-12 ENCOUNTER — Encounter (HOSPITAL_COMMUNITY)
Admission: RE | Disposition: A | Discharge status: Home / Self Care | Payer: Self-pay | Source: Home / Self Care | Attending: Podiatry

## 2019-04-12 ENCOUNTER — Ambulatory Visit (HOSPITAL_COMMUNITY): Payer: 59 | Admitting: Certified Registered Nurse Anesthetist

## 2019-04-12 ENCOUNTER — Other Ambulatory Visit: Payer: Self-pay

## 2019-04-12 DIAGNOSIS — E119 Type 2 diabetes mellitus without complications: Secondary | ICD-10-CM | POA: Insufficient documentation

## 2019-04-12 DIAGNOSIS — J449 Chronic obstructive pulmonary disease, unspecified: Secondary | ICD-10-CM | POA: Insufficient documentation

## 2019-04-12 DIAGNOSIS — Z6841 Body Mass Index (BMI) 40.0 and over, adult: Secondary | ICD-10-CM | POA: Insufficient documentation

## 2019-04-12 DIAGNOSIS — I10 Essential (primary) hypertension: Secondary | ICD-10-CM | POA: Diagnosis not present

## 2019-04-12 DIAGNOSIS — M2041 Other hammer toe(s) (acquired), right foot: Secondary | ICD-10-CM | POA: Insufficient documentation

## 2019-04-12 DIAGNOSIS — G4730 Sleep apnea, unspecified: Secondary | ICD-10-CM | POA: Insufficient documentation

## 2019-04-12 HISTORY — DX: Other hammer toe(s) (acquired), right foot: M20.41

## 2019-04-12 HISTORY — PX: HAMMER TOE SURGERY: SHX385

## 2019-04-12 LAB — GLUCOSE, CAPILLARY
Glucose-Capillary: 118 mg/dL — ABNORMAL HIGH (ref 70–99)
Glucose-Capillary: 175 mg/dL — ABNORMAL HIGH (ref 70–99)

## 2019-04-12 SURGERY — CORRECTION, HAMMER TOE
Anesthesia: General | Site: Foot | Laterality: Right

## 2019-04-12 MED ORDER — FENTANYL CITRATE (PF) 250 MCG/5ML IJ SOLN
INTRAMUSCULAR | Status: AC
  Filled 2019-04-12: qty 5

## 2019-04-12 MED ORDER — LACTATED RINGERS IV SOLN
INTRAVENOUS | Status: DC
  Administered 2019-04-12 (×2): via INTRAVENOUS

## 2019-04-12 MED ORDER — DEXAMETHASONE SODIUM PHOSPHATE 10 MG/ML IJ SOLN
INTRAMUSCULAR | Status: DC | PRN
  Administered 2019-04-12: 4 mg via INTRAVENOUS

## 2019-04-12 MED ORDER — BUPIVACAINE HCL (PF) 0.5 % IJ SOLN
INTRAMUSCULAR | Status: DC | PRN
  Administered 2019-04-12: 10 mL

## 2019-04-12 MED ORDER — OXYCODONE-ACETAMINOPHEN 5-325 MG PO TABS: 1.0000 | ORAL_TABLET | Freq: Four times a day (QID) | ORAL | 0 refills | Status: DC | PRN

## 2019-04-12 MED ORDER — PROPOFOL 10 MG/ML IV BOLUS
INTRAVENOUS | Status: AC
  Filled 2019-04-12: qty 20

## 2019-04-12 MED ORDER — ONDANSETRON HCL 4 MG/2ML IJ SOLN
INTRAMUSCULAR | Status: AC
  Filled 2019-04-12: qty 2

## 2019-04-12 MED ORDER — PROPOFOL 10 MG/ML IV BOLUS
INTRAVENOUS | Status: DC | PRN
  Administered 2019-04-12: 200 mg via INTRAVENOUS

## 2019-04-12 MED ORDER — MIDAZOLAM HCL 2 MG/2ML IJ SOLN
INTRAMUSCULAR | Status: DC | PRN
  Administered 2019-04-12: 2 mg via INTRAVENOUS

## 2019-04-12 MED ORDER — PHENYLEPHRINE 40 MCG/ML (10ML) SYRINGE FOR IV PUSH (FOR BLOOD PRESSURE SUPPORT)
PREFILLED_SYRINGE | INTRAVENOUS | Status: AC
  Filled 2019-04-12: qty 10

## 2019-04-12 MED ORDER — LIDOCAINE HCL 2 % IJ SOLN
INTRAMUSCULAR | Status: AC
  Filled 2019-04-12: qty 20

## 2019-04-12 MED ORDER — ONDANSETRON HCL 4 MG/2ML IJ SOLN
INTRAMUSCULAR | Status: DC | PRN
  Administered 2019-04-12: 4 mg via INTRAVENOUS

## 2019-04-12 MED ORDER — OXYCODONE-ACETAMINOPHEN 5-325 MG PO TABS
ORAL_TABLET | ORAL | Status: AC
  Filled 2019-04-12: qty 1

## 2019-04-12 MED ORDER — CHLORHEXIDINE GLUCONATE 4 % EX LIQD: 60.0000 mL | Freq: Once | CUTANEOUS | Status: DC

## 2019-04-12 MED ORDER — DEXAMETHASONE SODIUM PHOSPHATE 10 MG/ML IJ SOLN
INTRAMUSCULAR | Status: AC
  Filled 2019-04-12: qty 1

## 2019-04-12 MED ORDER — BUPIVACAINE HCL (PF) 0.5 % IJ SOLN
INTRAMUSCULAR | Status: AC
  Filled 2019-04-12: qty 30

## 2019-04-12 MED ORDER — OXYCODONE-ACETAMINOPHEN 5-325 MG PO TABS
1.0000 | ORAL_TABLET | ORAL | Status: AC
  Administered 2019-04-12: 1 via ORAL

## 2019-04-12 MED ORDER — 0.9 % SODIUM CHLORIDE (POUR BTL) OPTIME
TOPICAL | Status: DC | PRN
  Administered 2019-04-12: 1000 mL

## 2019-04-12 MED ORDER — POVIDONE-IODINE 7.5 % EX SOLN
Freq: Once | CUTANEOUS | Status: DC
  Filled 2019-04-12: qty 118

## 2019-04-12 MED ORDER — PHENYLEPHRINE 40 MCG/ML (10ML) SYRINGE FOR IV PUSH (FOR BLOOD PRESSURE SUPPORT)
PREFILLED_SYRINGE | INTRAVENOUS | Status: DC | PRN
  Administered 2019-04-12 (×4): 80 ug via INTRAVENOUS

## 2019-04-12 MED ORDER — FENTANYL CITRATE (PF) 250 MCG/5ML IJ SOLN: INTRAMUSCULAR | Status: DC | PRN

## 2019-04-12 MED ORDER — LIDOCAINE HCL (CARDIAC) PF 100 MG/5ML IV SOSY
PREFILLED_SYRINGE | INTRAVENOUS | Status: DC | PRN
  Administered 2019-04-12: 80 mg via INTRAVENOUS

## 2019-04-12 MED ORDER — FENTANYL CITRATE (PF) 250 MCG/5ML IJ SOLN
INTRAMUSCULAR | Status: DC | PRN
  Administered 2019-04-12: 50 ug via INTRAVENOUS

## 2019-04-12 MED ORDER — LIDOCAINE HCL 2 % IJ SOLN
INTRAMUSCULAR | Status: DC | PRN
  Administered 2019-04-12: 10 mL

## 2019-04-12 MED ORDER — MIDAZOLAM HCL 2 MG/2ML IJ SOLN
INTRAMUSCULAR | Status: AC
  Filled 2019-04-12: qty 2

## 2019-04-12 SURGICAL SUPPLY — 60 items
BANDAGE ACE 4X5 VEL STRL LF (GAUZE/BANDAGES/DRESSINGS) ×3 IMPLANT
BANDAGE ELASTIC 4 VELCRO ST LF (GAUZE/BANDAGES/DRESSINGS) ×1 IMPLANT
BIT DRILL 2XSM QC CANN SCR SYS (BIT) IMPLANT
BIT DRL 2XSM QC CANN SCR SYS (BIT) ×1
BLADE MINI RND TIP GREEN BEAV (BLADE) ×1 IMPLANT
BLADE OSCILLATING/SAGITTAL (BLADE) ×1
BLADE SURG 15 STRL LF DISP TIS (BLADE) ×2 IMPLANT
BLADE SURG 15 STRL SS (BLADE) ×4
BLADE SW THK.38XMED NAR THN (BLADE) IMPLANT
BNDG CMPR 9X4 STRL LF SNTH (GAUZE/BANDAGES/DRESSINGS) ×1
BNDG ESMARK 4X9 LF (GAUZE/BANDAGES/DRESSINGS) ×2 IMPLANT
BNDG GAUZE ELAST 4 BULKY (GAUZE/BANDAGES/DRESSINGS) ×2 IMPLANT
COVER WAND RF STERILE (DRAPES) ×2 IMPLANT
CUFF TOURN SGL QUICK 18X4 (TOURNIQUET CUFF) IMPLANT
CUFF TOURN SGL QUICK 24 (TOURNIQUET CUFF)
CUFF TRNQT CYL 24X4X16.5-23 (TOURNIQUET CUFF) IMPLANT
DRAPE EXTREMITY T 121X128X90 (DISPOSABLE) ×2 IMPLANT
DRAPE OEC MINIVIEW 54X84 (DRAPES) ×2 IMPLANT
DRAPE U-SHAPE 47X51 STRL (DRAPES) ×2 IMPLANT
DRILL BIT 2.0 (BIT) ×2
DRSG XEROFORM 1X8 (GAUZE/BANDAGES/DRESSINGS) ×2 IMPLANT
ELECT REM PT RETURN 9FT ADLT (ELECTROSURGICAL) ×2
ELECTRODE REM PT RTRN 9FT ADLT (ELECTROSURGICAL) ×1 IMPLANT
GAUZE SPONGE 4X4 12PLY STRL (GAUZE/BANDAGES/DRESSINGS) ×2 IMPLANT
GAUZE SPONGE 4X4 12PLY STRL LF (GAUZE/BANDAGES/DRESSINGS) ×1 IMPLANT
GAUZE XEROFORM 1X8 LF (GAUZE/BANDAGES/DRESSINGS) ×2 IMPLANT
GLOVE BIO SURGEON STRL SZ8 (GLOVE) IMPLANT
GLOVE BIOGEL PI IND STRL 8 (GLOVE) IMPLANT
GLOVE BIOGEL PI INDICATOR 8 (GLOVE)
GOWN STRL REUS W/ TWL LRG LVL3 (GOWN DISPOSABLE) ×1 IMPLANT
GOWN STRL REUS W/ TWL XL LVL3 (GOWN DISPOSABLE) ×1 IMPLANT
GOWN STRL REUS W/TWL LRG LVL3 (GOWN DISPOSABLE) ×2
GOWN STRL REUS W/TWL XL LVL3 (GOWN DISPOSABLE) ×2
K-WIRE 0.9 (WIRE) ×3 IMPLANT
KIT BASIN OR (CUSTOM PROCEDURE TRAY) ×2 IMPLANT
NDL 18GX1X1/2 (RX/OR ONLY) (NEEDLE) IMPLANT
NDL HYPO 25GX1X1/2 BEV (NEEDLE) IMPLANT
NDL HYPO 25X1 1.5 SAFETY (NEEDLE) ×1 IMPLANT
NDL SAFETY ECLIPSE 18X1.5 (NEEDLE) IMPLANT
NEEDLE 18GX1X1/2 (RX/OR ONLY) (NEEDLE) ×2 IMPLANT
NEEDLE HYPO 18GX1.5 SHARP (NEEDLE)
NEEDLE HYPO 25GX1X1/2 BEV (NEEDLE) ×2 IMPLANT
NEEDLE HYPO 25X1 1.5 SAFETY (NEEDLE) ×2 IMPLANT
NS IRRIG 1000ML POUR BTL (IV SOLUTION) IMPLANT
PACK ORTHO EXTREMITY (CUSTOM PROCEDURE TRAY) ×2 IMPLANT
PADDING CAST ABS 4INX4YD NS (CAST SUPPLIES) ×2
PADDING CAST ABS COTTON 4X4 ST (CAST SUPPLIES) ×2 IMPLANT
SCREW CANN THRD ST 2.5X38 (Screw) ×1 IMPLANT
SCREW CANN THRD ST 2.5X46 (Screw) ×1 IMPLANT
SCREW CANN THRD ST 2.5X48 (Screw) ×1 IMPLANT
SCRUB BETADINE 4OZ XXX (MISCELLANEOUS) ×4 IMPLANT
SPLINT FIBERGLASS 4X30 (CAST SUPPLIES) ×2 IMPLANT
STAPLER VISISTAT (STAPLE) IMPLANT
SUT PROLENE 4 0 PS 2 18 (SUTURE) ×4 IMPLANT
SUT VIC AB 4-0 PS2 18 (SUTURE) ×1 IMPLANT
SUT VICRYL 4-0 PS2 18IN ABS (SUTURE) IMPLANT
SYR 10ML LL (SYRINGE) ×2 IMPLANT
SYR BULB 3OZ (MISCELLANEOUS) ×2 IMPLANT
SYR CONTROL 10ML LL (SYRINGE) IMPLANT
UNDERPAD 30X30 (UNDERPADS AND DIAPERS) ×2 IMPLANT

## 2019-04-12 NOTE — Progress Notes (Signed)
Orthopedic Tech Progress Note Patient Details:  [name] 1961-01-20 [id number]  Ortho Devices Type of Ortho Device: Postop shoe/boot Ortho Device/Splint Location: RLE Ortho Device/Splint Interventions: Ordered, Application   Post Interventions Patient Tolerated: Well   Braulio Bosch 04/12/2019, 4:57 PM

## 2019-04-12 NOTE — Anesthesia Procedure Notes (Signed)
Procedure Name: LMA Insertion Date/Time: 04/12/2019 3:05 PM Performed by: Jenne Campus, CRNA Pre-anesthesia Checklist: Patient identified, Emergency Drugs available, Suction available and Patient being monitored Patient Re-evaluated:Patient Re-evaluated prior to induction Oxygen Delivery Method: Circle System Utilized Preoxygenation: Pre-oxygenation with 100% oxygen Induction Type: IV induction Ventilation: Mask ventilation without difficulty LMA: LMA inserted LMA Size: 4.0 Number of attempts: 1 Airway Equipment and Method: Bite block Placement Confirmation: positive ETCO2 and breath sounds checked- equal and bilateral Tube secured with: Tape Dental Injury: Teeth and Oropharynx as per pre-operative assessment

## 2019-04-12 NOTE — H&P (Signed)
Anesthesia H&P Update: History and Physical Exam reviewed; patient is OK for planned anesthetic and procedure. ? ?

## 2019-04-12 NOTE — Brief Op Note (Signed)
04/12/2019  4:13 PM  PATIENT:  [name]  58 y.o. female  PRE-OPERATIVE DIAGNOSIS:  HAMMER TOE  POST-OPERATIVE DIAGNOSIS:  HAMMER TOE  PROCEDURE:  Procedure(s): HAMMER TOE CORRECTION, 2ND, 3RD, 4TH AND 5TH TOES OF RIGHT FOOT (Right)  SURGEON:  Surgeon(s) and Role:    Edrick Kins, DPM - Primary  PHYSICIAN ASSISTANT: none  ASSISTANTS: none   ANESTHESIA:   local and general  EBL:  3 mL   BLOOD ADMINISTERED:none  DRAINS: none   LOCAL MEDICATIONS USED:  MARCAINE    and LIDOCAINE   SPECIMEN:  No Specimen  DISPOSITION OF SPECIMEN:  PATHOLOGY  COUNTS:  YES  TOURNIQUET:   Total Tourniquet Time Documented: Calf (Right) - 46 minutes Total: Calf (Right) - 46 minutes   DICTATION: Sales executive  PLAN OF CARE: Discharge to home after PACU  PATIENT DISPOSITION:  PACU - hemodynamically stable.   Delay start of Pharmacological VTE agent (>24hrs) due to surgical blood loss or risk of bleeding: not applicable  Edrick Kins, DPM [facility]  Dr. Edrick Kins, DPM    [address], [place] [address]                Office [phone]  Fax [phone]

## 2019-04-12 NOTE — Progress Notes (Signed)
Orthopedic Tech Progress Note Patient Details:  [name] 1960-10-13 [id number]  Ortho Devices Type of Ortho Device: CAM walker Ortho Device/Splint Location: RLE Ortho Device/Splint Interventions: Application   Post Interventions Patient Tolerated: Well Instructions Provided: Care of device   [name] 04/12/2019, 5:04 PM

## 2019-04-12 NOTE — Transfer of Care (Signed)
Immediate Anesthesia Transfer of Care Note  Patient: [name]  Procedure(s) Performed: HAMMER TOE CORRECTION, 2ND, 3RD, 4TH AND 5TH TOES OF RIGHT FOOT (Right Foot)  Patient Location: PACU  Anesthesia Type:General  Level of Consciousness: oriented, drowsy and patient cooperative  Airway & Oxygen Therapy: Patient Spontanous Breathing and Patient connected to face mask oxygen  Post-op Assessment: Report given to RN and Post -op Vital signs reviewed and stable  Post vital signs: Reviewed  Last Vitals:  Vitals Value Taken Time  BP 140/63 04/12/19 1624  Temp    Pulse 79 04/12/19 1624  Resp 17 04/12/19 1624  SpO2 100 % 04/12/19 1624  Vitals shown include unvalidated device data.  Last Pain:  Vitals:   04/12/19 1212  TempSrc: Oral         Complications: No apparent anesthesia complications

## 2019-04-12 NOTE — Progress Notes (Signed)
Orthopedic Tech Progress Note Patient Details:  [name] 1961/06/19 [id number]  Ortho Devices Type of Ortho Device: Postop shoe/boot Ortho Device/Splint Location: RLE Ortho Device/Splint Interventions: Ordered, Application   Post Interventions Patient Tolerated: Well   Braulio Bosch 04/12/2019, 4:49 PM

## 2019-04-12 NOTE — Anesthesia Postprocedure Evaluation (Signed)
Anesthesia Post Note  Patient: [name]  Procedure(s) Performed: HAMMER TOE CORRECTION, 2ND, 3RD, 4TH AND 5TH TOES OF RIGHT FOOT (Right Foot)     Patient location during evaluation: PACU Anesthesia Type: General Level of consciousness: awake and alert Pain management: pain level controlled Vital Signs Assessment: post-procedure vital signs reviewed and stable Respiratory status: spontaneous breathing, nonlabored ventilation and respiratory function stable Cardiovascular status: blood pressure returned to baseline and stable Postop Assessment: no apparent nausea or vomiting Anesthetic complications: no    Last Vitals:  Vitals:   04/12/19 1626 04/12/19 1637  BP: 140/63 137/72  Pulse: 76 77  Resp: 16 14  Temp: 36.4 C   SpO2:  98%    Last Pain:  Vitals:   04/12/19 1212  TempSrc: Oral                 Lynda Rainwater

## 2019-04-16 ENCOUNTER — Ambulatory Visit: Payer: Medicare Other | Admitting: Endocrinology

## 2019-04-18 ENCOUNTER — Other Ambulatory Visit: Payer: Self-pay

## 2019-04-18 ENCOUNTER — Ambulatory Visit (INDEPENDENT_AMBULATORY_CARE_PROVIDER_SITE_OTHER): Payer: 59

## 2019-04-18 ENCOUNTER — Ambulatory Visit (INDEPENDENT_AMBULATORY_CARE_PROVIDER_SITE_OTHER): Payer: Medicare Other | Admitting: Podiatry

## 2019-04-18 ENCOUNTER — Encounter: Payer: Self-pay | Admitting: Podiatry

## 2019-04-18 VITALS — Temp 97.9°F

## 2019-04-18 DIAGNOSIS — M2041 Other hammer toe(s) (acquired), right foot: Secondary | ICD-10-CM

## 2019-04-18 DIAGNOSIS — Z9889 Other specified postprocedural states: Secondary | ICD-10-CM

## 2019-04-19 ENCOUNTER — Encounter (HOSPITAL_COMMUNITY): Payer: Self-pay | Admitting: Podiatry

## 2019-04-20 ENCOUNTER — Ambulatory Visit: Payer: 59

## 2019-04-20 ENCOUNTER — Other Ambulatory Visit: Payer: Self-pay | Admitting: Cardiology

## 2019-04-23 NOTE — Progress Notes (Signed)
   Subjective:  Patient presents today status post hammertoe repair of digits 2-5 right. DOS: 04/12/2019. She denies any significant pain. She has been taking the pain medication as directed which helps alleviate any pain she may have. She has been using the post op shoe as directed. Patient is here for further evaluation and treatment.    Past Medical History:  Diagnosis Date  . Abnormal SPEP 04/17/2014  . Acute left ankle pain 01/26/2017  . ANEMIA-UNSPECIFIED 09/18/2009  . Anxiety   . Arthritis   . Chronic diastolic heart failure, NYHA class 2 (HCC)    Normal LVEDP by May 2018  . COPD (chronic obstructive pulmonary disease) ([place])   . Depression   . DIABETES MELLITUS, TYPE II 08/21/2006  . Diabetic osteomyelitis ([place]) 05/29/2015  . Fracture of 5th metatarsal    non union  . GERD 44/10/270   Had fundoplication  . GOUT 08/20/2010  . Hammer toe of right foot    2-5th toes  . Hx of umbilical hernia repair   . HYPERLIPIDEMIA 08/21/2006  . HYPERTENSION 08/21/2006  . Infection of wound due to methicillin resistant Staphylococcus aureus (MRSA)   . Internal hemorrhoids   . Multiple allergies 10/14/2016  . OBESITY 06/04/2009  . Onychomycosis 10/27/2015  . Osteomyelitis of left foot ([place]) 05/29/2015  . Pulmonary sarcoidosis ([place])    Followed locally by pulmonology, but also by Dr. Casper Harrison at [facility]  . Right knee pain 01/26/2017  . Vocal cord dysfunction   . Wears partial dentures       Objective/Physical Exam Neurovascular status intact.  Skin incisions appear to be well coapted with sutures and staples intact. No sign of infectious process noted. No dehiscence. No active bleeding noted. Moderate edema noted to the surgical extremity.  Radiographic Exam:  Orthopedic hardware and osteotomies sites appear to be stable with routine healing.  Assessment: 1. s/p hammertoe repair digits 2-5 right. DOS: 04/12/2019   Plan of Care:  1. Patient was evaluated. X-rays reviewed 2.  Dressing changed. Keep clean, dry and intact for one week.  3. Continue weightbearing in post op shoe.  4. Return to clinic in one week.    Edrick Kins, DPM [facility]  Dr. Edrick Kins, [address]                                        [place], [place] [address]                Office [phone]  Fax [phone]

## 2019-04-25 ENCOUNTER — Ambulatory Visit (INDEPENDENT_AMBULATORY_CARE_PROVIDER_SITE_OTHER): Payer: Medicare Other | Admitting: Podiatry

## 2019-04-25 ENCOUNTER — Encounter: Payer: Self-pay | Admitting: Podiatry

## 2019-04-25 ENCOUNTER — Other Ambulatory Visit: Payer: Self-pay

## 2019-04-25 DIAGNOSIS — Z9889 Other specified postprocedural states: Secondary | ICD-10-CM

## 2019-04-25 DIAGNOSIS — M2041 Other hammer toe(s) (acquired), right foot: Secondary | ICD-10-CM | POA: Diagnosis not present

## 2019-04-28 NOTE — Progress Notes (Signed)
   Subjective:  Patient presents today status post hammertoe repair of digits 2-5 right. DOS: 04/12/2019. She states she is doing well. She denies any pain or modifying factors. She has been using the CAM boot as directed. Patient is here for further evaluation and treatment.  Past Medical History:  Diagnosis Date  . Abnormal SPEP 04/17/2014  . Acute left ankle pain 01/26/2017  . ANEMIA-UNSPECIFIED 09/18/2009  . Anxiety   . Arthritis   . Chronic diastolic heart failure, NYHA class 2 (HCC)    Normal LVEDP by May 2018  . COPD (chronic obstructive pulmonary disease) ([place])   . Depression   . DIABETES MELLITUS, TYPE II 08/21/2006  . Diabetic osteomyelitis ([place]) 05/29/2015  . Fracture of 5th metatarsal    non union  . GERD 08/07/2535   Had fundoplication  . GOUT 08/20/2010  . Hammer toe of right foot    2-5th toes  . Hx of umbilical hernia repair   . HYPERLIPIDEMIA 08/21/2006  . HYPERTENSION 08/21/2006  . Infection of wound due to methicillin resistant Staphylococcus aureus (MRSA)   . Internal hemorrhoids   . Multiple allergies 10/14/2016  . OBESITY 06/04/2009  . Onychomycosis 10/27/2015  . Osteomyelitis of left foot ([place]) 05/29/2015  . Pulmonary sarcoidosis ([place])    Followed locally by pulmonology, but also by Dr. Casper Harrison at [facility]  . Right knee pain 01/26/2017  . Vocal cord dysfunction   . Wears partial dentures       Objective/Physical Exam Neurovascular status intact.  Skin incisions appear to be well coapted with sutures and staples intact. No sign of infectious process noted. No dehiscence. No active bleeding noted. Moderate edema noted to the surgical extremity.  Assessment: 1. s/p hammertoe repair digits 2-5 right. DOS: 04/12/2019   Plan of Care:  1. Patient was evaluated. 2. Sutures removed.  3. Discontinue using CAM boot.  4. Post op shoe dispensed.  5. Return to clinic in 3 weeks. At this time, we will arrange surgery for the left foot.   Going on vacation  last week of August.    Edrick Kins, DPM [facility]  Dr. Edrick Kins, [address]                                        [place], [place] [address]                Office [phone]  Fax [phone]

## 2019-04-30 ENCOUNTER — Other Ambulatory Visit: Payer: Self-pay

## 2019-04-30 ENCOUNTER — Encounter: Payer: Self-pay | Admitting: Physician Assistant

## 2019-04-30 ENCOUNTER — Ambulatory Visit (INDEPENDENT_AMBULATORY_CARE_PROVIDER_SITE_OTHER): Payer: 59 | Admitting: Orthopedic Surgery

## 2019-04-30 VITALS — Ht 66.0 in | Wt 255.9 lb

## 2019-04-30 DIAGNOSIS — M7541 Impingement syndrome of right shoulder: Secondary | ICD-10-CM

## 2019-05-03 NOTE — Op Note (Signed)
OPERATIVE REPORT Patient name: [name] MRN: [id number] DOB: 1960-10-29  DOS:  05/03/2019  Preop Dx: Hammertoes digits 2-5 right foot. Postop Dx: same  Procedure:  1.  Hammertoe arthrodesis second digit right foot 2.  Hammertoe arthrodesis third digit right foot 3.  Hammertoe arthrodesis fourth digit right foot 4.  PIPJ arthroplasty with derotational skin plasty fifth digit right foot  Surgeon: Edrick Kins DPM  Anesthesia: 50-50 mixture of 2% lidocaine plain with 0.5% Marcaine plain totaling 20 mL infiltrated in the patient's right lower extremity  Hemostasis: Ankle tourniquet inflated to a pressure of 263mmHg after esmarch exsanguination   EBL: Minimal mL Materials: Orthopro cannulated screws x 3 Injectables: None Pathology: None  Condition: The patient tolerated the procedure and anesthesia well. No complications noted or reported   Justification for procedure: The patient is a 58 y.o. female who presents today for surgical correction of symptomatic hammertoes to digits 2-5 of the right foot. All conservative modalities of been unsuccessful in providing any sort of satisfactory alleviation of symptoms with the patient. The patient was told benefits as well as possible side effects of the surgery. The patient consented for surgical correction. The patient consent form was reviewed. All patient questions were answered. No guarantees were expressed or implied. The patient and the surgeon boson the patient consent form with the witness present and placed in the patient's chart.   Procedure in Detail: The patient was brought to the operating room, placed in the operating table in the supine position at which time an aseptic scrub and drape were performed about the patient's respective lower extremity after anesthesia was induced as described above. Attention was then directed to the surgical area where procedure number one commenced.  Procedure #1: Hammertoe arthrodesis  second digit right foot A transverse elliptical incision was planned and made overlying the PIPJ of the second digit right foot.  The ellipsed wedge of skin was removed and transverse tenotomy was performed of the EDL tendon to expose the underlying PIPJ.  The hypertrophic head of the PIPJ was then surgically removed with a sagittal saw.  The cartilaginous portion of the base of the middle phalanx was also debrided away to allow for better arthrodesis and fusion of the toe joint.  After appropriate preparation of the joint and Ortho pro 2.0 millimeter screw was driven percutaneously through the distal tuft of the digit across the DIPJ and PIPJ.  Screw was inserted in the standard AO fashion.  A small stab incision was made overlying the distal tuft of the toe to allow for the screw placement.  Intraoperative x-ray fluoroscopy was utilized to visualize the correction of the hammertoe as well as placement of the screw which was satisfactory.  Incision sites were then copiously irrigated with normal saline in preparation for primary closure.  4-0 Vicryl sutures utilized to reapproximate the EDL tendon followed by 4-0 Prolene suture to reapproximate superficial skin edges of both incision sites.  Procedure #2: Hammertoe arthrodesis third digit right foot Procedure #2 commenced in the exact same fashion manner as procedure #1 with the exception that procedure #2 was performed to the third digit right foot  Procedure #3: Hammertoe arthrodesis fourth digit right foot Procedure #3 commenced in the exact same fashion manner as procedure #1 with the exception that procedure #3 was performed to the fourth digit right foot  Procedure #4: PIPJ arthroplasty with derotational skin plasty fifth digit right foot An elliptical incision was planned and made overlying the PIPJ  of the fifth digit right foot in an oblique orientation.  The ellipsed wedge of skin was removed to expose the underlying EDL tendon at which time a  transverse tenotomy was performed to expose the underlying PIPJ.  The hypertrophic head of the proximal phalanx was resected away using a sagittal blade mounted on the sagittal saw.  Irrigation was utilized and 4-0 Vicryl suture was utilized to reapproximate the EDL tendon followed by 4-0 Prolene suture to reapproximate superficial skin edges and a derotational type reapproximation of the superficial skin to allow for more rectus alignment of the toe.  Dry sterile compressive dressings were then applied to all previously mentioned incision sites about the patient's lower extremity. The tourniquet which was used for hemostasis was deflated. All normal neurovascular responses including pink color and warmth returned all the digits of patient's lower extremity.  The patient was then transferred from the operating room to the recovery room having tolerated the procedure and anesthesia well. All vital signs are stable. After a brief stay in the recovery room the patient was discharged with adequate prescriptions for analgesia. Verbal as well as written instructions were provided for the patient regarding wound care. The patient is to keep the dressings clean dry and intact until they are to follow surgeon Dr. Daylene Katayama in the office upon discharge.   Edrick Kins, DPM [facility]  Dr. Edrick Kins, [address]                                        [place], [place] [address]                Office [phone]  Fax [phone]

## 2019-05-06 ENCOUNTER — Encounter: Payer: Self-pay | Admitting: Orthopedic Surgery

## 2019-05-06 DIAGNOSIS — M7541 Impingement syndrome of right shoulder: Secondary | ICD-10-CM | POA: Diagnosis not present

## 2019-05-06 MED ORDER — LIDOCAINE HCL 1 % IJ SOLN
5.0000 mL | INTRAMUSCULAR | Status: AC | PRN
  Administered 2019-05-06: 5 mL

## 2019-05-06 MED ORDER — METHYLPREDNISOLONE ACETATE 40 MG/ML IJ SUSP
40.0000 mg | INTRAMUSCULAR | Status: AC | PRN
  Administered 2019-05-06: 40 mg via INTRA_ARTICULAR

## 2019-05-06 NOTE — Progress Notes (Signed)
Office Visit Note   Patient: [name]           Date of Birth: 07-Aug-1961           MRN: [id number] Visit Date: 04/30/2019              Requested by: [name], MD [place],  [place] [address] PCP: [name], MD  Chief Complaint  Patient presents with  . Right Upper Arm - Pain  . Left Shoulder - Routine Post Op    03/14/19 shoulder arthro & deb      HPI: Patient is a 58 year old woman who presents for 2 separate issues #1 she is status post left shoulder arthroscopy and debridement June 3 #2 she has had a new onset of right shoulder pain which started about 2 to 3 weeks ago.  She states her pain is 4-5 and feels like she has a mass not in the proximal area of the biceps.  Assessment & Plan: Visit Diagnoses:  1. Impingement syndrome of right shoulder     Plan: The right shoulder was injected in subacromial space will reevaluate in 4 weeks.  Patient may have rupture of the long head of her biceps tendon.  Follow-Up Instructions: Return in about 4 weeks (around 05/28/2019).   Ortho Exam  Patient is alert, oriented, no adenopathy, well-dressed, normal affect, normal respiratory effort. Examination patient is asymptomatic with the left shoulder.  Examination the right shoulder she has active abduction and flexion to 90 degrees she has pain to palpation over the long head of the biceps tendon.  She has pain with Neer and Hawkins impingement test.  Patient does have swelling proximally around the right shoulder.  Imaging: No results found. No images are attached to the encounter.  Labs: Lab Results  Component Value Date   HGBA1C 8.2 (H) 03/12/2019   HGBA1C 8.2 (A) 08/25/2018   HGBA1C 7.6 (A) 04/11/2018   ESRSEDRATE 28 06/16/2017   ESRSEDRATE 5 01/26/2017   ESRSEDRATE 27 10/14/2016   CRP 27.1 (H) 06/16/2017   CRP 17.3 (H) 01/26/2017   CRP 24.0 (H) 10/14/2016   LABURIC 5.1 01/26/2017   REPTSTATUS 09/22/2017 FINAL 09/20/2017   GRAMSTAIN  08/14/2017    MODERATE WBC PRESENT, PREDOMINANTLY PMN MODERATE BUDDING YEAST SEEN    CULT NO GROUP A STREP (S.PYOGENES) ISOLATED 09/20/2017   LABORGA PSEUDOMONAS FLUORESCENS 08/14/2017     Lab Results  Component Value Date   ALBUMIN 4.1 12/04/2018   ALBUMIN 4.4 04/05/2018   ALBUMIN 4.2 02/08/2018   LABURIC 5.1 01/26/2017    Lab Results  Component Value Date   MG 1.7 09/23/2017   MG 1.9 09/22/2017   MG 1.8 09/21/2017   No results found for: VD25OH  No results found for: PREALBUMIN CBC EXTENDED Latest Ref Rng & Units 04/10/2019 03/12/2019 12/13/2018  WBC 4.0 - 10.5 K/uL 9.7 9.4 17.5(H)  RBC 3.87 - 5.11 MIL/uL 3.76(L) 3.68(L) 3.95  HGB 12.0 - 15.0 g/dL 10.2(L) 10.0(L) 10.5(L)  HCT 36.0 - 46.0 % 33.3(L) 33.1(L) 32.6(L)  PLT 150 - 400 K/uL 341 311 422.0(H)  NEUTROABS 1.4 - 7.7 K/uL - - -  LYMPHSABS 0.7 - 4.0 K/uL - - -     Body mass index is 41.31 kg/m.  Orders:  No orders of the defined types were placed in this encounter.  No orders of the defined types were placed in this encounter.    Procedures: Large Joint Inj: R subacromial bursa on  05/06/2019 11:13 AM Indications: diagnostic evaluation and pain Details: 22 G 1.5 in needle, posterior approach  Arthrogram: No  Medications: 5 mL lidocaine 1 %; 40 mg methylPREDNISolone acetate 40 MG/ML Outcome: tolerated well, no immediate complications Procedure, treatment alternatives, risks and benefits explained, specific risks discussed. Consent was given by the patient. Immediately prior to procedure a time out was called to verify the correct patient, procedure, equipment, support staff and site/side marked as required. Patient was prepped and draped in the usual sterile fashion.      Clinical Data: No additional findings.  ROS:  All other systems negative, except as noted in the HPI. Review of Systems  Objective: Vital Signs: Ht 5\' 6"  (1.676 m)   Wt 255 lb 15.2 oz (116.1 kg)   BMI 41.31 kg/m    Specialty Comments:  No specialty comments available.  PMFS History: Patient Active Problem List   Diagnosis Date Noted  . Nontraumatic complete tear of left rotator cuff   . Impingement syndrome of left shoulder   . Diabetic neuropathy ([place]) 02/20/2019  . Severe persistent asthma 01/04/2019  . Subcortical microvascular ischemic occlusive disease 07/12/2018  . Rapid palpitations 07/12/2018  . Impingement of left ankle joint 06/26/2018  . Constipation 04/21/2018  . Anxiety 04/05/2018  . Total knee replacement status, left 12/07/2017  . Dysphagia 09/20/2017  . Epiglottitis   . Plantar fasciitis, right 07/13/2017  . S/P total knee arthroplasty, right 06/29/2017  . Osteoarthrosis, localized, primary, knee, right   . Sprain of calcaneofibular ligament of right ankle 06/06/2017  . Osteoarthritis of right knee 01/26/2017  . Onychomycosis 10/27/2015  . CKD (chronic kidney disease) stage 3, GFR 30-59 ml/min (HCC) 04/27/2015  . MRSA (methicillin resistant staph aureus) culture positive 03/27/2015  . History of osteomyelitis 03/27/2015  . Fatty liver 09/30/2014  . Hot flashes 07/15/2014  . Abnormal SPEP 04/17/2014  . Fracture of left leg 04/17/2014  . Cushingoid side effect of steroids ([place]) 04/17/2014  . Internal hemorrhoids   . Major depression in full remission ([place])   . Preoperative clearance 03/25/2014  . Obstructive chronic bronchitis without exacerbation ([place]) 09/18/2013  . Chest pain 04/11/2013  . Hypertensive heart disease with chronic diastolic congestive heart failure ([place])   . Solitary pulmonary nodule, on CT 02/2013 - stable over 2 years in 2015 02/20/2013  . Gout 08/20/2010  . Anemia 09/18/2009  . Morbid obesity ([place]) 06/04/2009  . Sleep apnea 04/21/2009  . Sarcoidosis of lung ([place]) 04/10/2007  . Hyperlipidemia associated with type 2 diabetes mellitus ([place]) 08/21/2006  . Hypertension associated with diabetes ([place]) 08/21/2006  . GERD 08/21/2006  . Type II diabetes  mellitus with renal manifestations ([place]) 08/21/2006   Past Medical History:  Diagnosis Date  . Abnormal SPEP 04/17/2014  . Acute left ankle pain 01/26/2017  . ANEMIA-UNSPECIFIED 09/18/2009  . Anxiety   . Arthritis   . Chronic diastolic heart failure, NYHA class 2 (HCC)    Normal LVEDP by May 2018  . COPD (chronic obstructive pulmonary disease) ([place])   . Depression   . DIABETES MELLITUS, TYPE II 08/21/2006  . Diabetic osteomyelitis ([place]) 05/29/2015  . Fracture of 5th metatarsal    non union  . GERD 34/74/2595   Had fundoplication  . GOUT 08/20/2010  . Hammer toe of right foot    2-5th toes  . Hx of umbilical hernia repair   . HYPERLIPIDEMIA 08/21/2006  . HYPERTENSION 08/21/2006  . Infection of wound due to methicillin resistant Staphylococcus aureus (MRSA)   .  Internal hemorrhoids   . Multiple allergies 10/14/2016  . OBESITY 06/04/2009  . Onychomycosis 10/27/2015  . Osteomyelitis of left foot ([place]) 05/29/2015  . Pulmonary sarcoidosis ([place])    Followed locally by pulmonology, but also by Dr. Casper Harrison at [facility]  . Right knee pain 01/26/2017  . Vocal cord dysfunction   . Wears partial dentures     Family History  Problem Relation Age of Onset  . Diabetes Father   . Heart attack Father 70  . Coronary artery disease Father   . Heart failure Father   . Throat cancer Father   . COPD Mother   . Emphysema Mother   . Asthma Mother   . Heart failure Mother   . Breast cancer Mother   . Heart attack Maternal Grandfather   . Sarcoidosis Maternal Uncle   . Lung cancer Brother   . Diabetes Brother   . Colon cancer Neg Hx   . Rectal cancer Neg Hx     Past Surgical History:  Procedure Laterality Date  . ABDOMINAL HYSTERECTOMY    . APPENDECTOMY    . BLADDER SUSPENSION  11/11/2011   Procedure: TRANSVAGINAL TAPE (TVT) PROCEDURE;  Surgeon: Olga Millers, MD;  Location: [facility];  Service: Gynecology;  Laterality: N/A;  . CAROTIDS  02/18/11   CAROTID DUPLEX; VERTEBRALS ARE  PATENT WITH ANTEGRADE FLOW. ICA/CCA RATIO 1.61 ON RIGHT AND 0.75 ON LEFT  . CHOLECYSTECTOMY  1984  . CYSTOSCOPY  11/11/2011   Procedure: CYSTOSCOPY;  Surgeon: Olga Millers, MD;  Location: [facility];  Service: Gynecology;  Laterality: N/A;  . EXTUBATION (ENDOTRACHEAL) IN OR N/A 09/23/2017   Procedure: EXTUBATION (ENDOTRACHEAL) IN OR;  Surgeon: Helayne Seminole, MD;  Location: [facility];  Service: ENT;  Laterality: N/A;  . FIBEROPTIC LARYNGOSCOPY AND TRACHEOSCOPY N/A 09/23/2017   Procedure: FLEXIBLE FIBEROPTIC LARYNGOSCOPY;  Surgeon: Helayne Seminole, MD;  Location: [facility];  Service: ENT;  Laterality: N/A;  . FRACTURE SURGERY     foot  . HALLUX FUSION Left 10/02/2018   Procedure: HALLUX ARTHRODESIS;  Surgeon: Edrick Kins, DPM;  Location: [facility];  Service: Podiatry;  Laterality: Left;  . HAMMER TOE SURGERY Left 10/02/2018   Procedure: HAMMER TOE CORRECTION 2ND 3RD 4RD FIFTH TOE;  Surgeon: Edrick Kins, DPM;  Location: [facility];  Service: Podiatry;  Laterality: Left;  . HAMMER TOE SURGERY Right 04/12/2019   Procedure: HAMMER TOE CORRECTION, 2ND, 3RD, 4TH AND 5TH TOES OF RIGHT FOOT;  Surgeon: Edrick Kins, DPM;  Location: [facility];  Service: Podiatry;  Laterality: Right;  . HERNIA REPAIR    . I&D EXTREMITY Left 06/27/2015   Procedure: Partial Excision Left Calcaneus, Place Antibiotic Beads, and Wound VAC;  Surgeon: Newt Minion, MD;  Location: [facility];  Service: Orthopedics;  Laterality: Left;  . KNEE ARTHROSCOPY     right  . LEFT AND RIGHT HEART CATHETERIZATION WITH CORONARY ANGIOGRAM N/A 04/23/2013   Procedure: LEFT AND RIGHT HEART CATHETERIZATION WITH CORONARY ANGIOGRAM;  Surgeon: Leonie Man, MD;  Location: [facility];  Service: Cardiovascular;  Laterality: N/A;  . LEFT HEART CATH AND CORONARY ANGIOGRAPHY N/A 03/11/2017   Procedure: Left Heart Cath and Coronary Angiography;  Surgeon: Sherren Mocha, MD;  Location: [facility]; angiographically minimal CAD in the LAD otherwise  normal.;  Normal LVEDP.  FALSE POSITIVE MYOVIEW  . LEFT HEART CATH AND CORONARY ANGIOGRAPHY  07/20/2010   LVEF 50-55% WITH VERY MILD GLOBAL HYPOKINESIA; ESSENTIALLY NORMAL  CORONARY ARTERIES; NORMAL LV FUNCTION  . METATARSAL OSTEOTOMY WITH OPEN REDUCTION INTERNAL FIXATION (ORIF) METATARSAL WITH FUSION Left 04/09/2014   Procedure: LEFT FOOT FRACTURE OPEN TREATMENT METATARSAL INCLUDES INTERNAL FIXATION EACH;  Surgeon: Lorn Junes, MD;  Location: [facility];  Service: Orthopedics;  Laterality: Left;  . NISSEN FUNDOPLICATION  5498  . NM MYOVIEW LTD  03/09/2013   Lexiscan --> EF 50%; NORMAL MYOCARDIAL PERFUSION STUDY - breast attenuation  . NM MYOVIEW LTD  03/10/2017   : Moderate size "stress-induced "perfusion defect at the apex as well as "ill-defined stress-induced perfusion defect in the lateral wall.  EF 55%.  INTERMEDIATE risk. -->  FALSE POSITIVE  . Right and left CARDIAC CATHETERIZATION  04/23/2013   Angiographic normal coronaries; LVEDP 20 mmHg, PCWP 12-14 mmHg, RAP 12 mmHg.; Fick CO/CI 4.9/2.2  . SHOULDER ARTHROSCOPY Left 03/14/2019   Procedure: LEFT SHOULDER ARTHROSCOPY, DEBRIDEMENT, AND DECOMPRESSION;  Surgeon: Newt Minion, MD;  Location: [facility];  Service: Orthopedics;  Laterality: Left;  . TOTAL KNEE ARTHROPLASTY Right 06/29/2017   Procedure: RIGHT TOTAL KNEE ARTHROPLASTY;  Surgeon: Newt Minion, MD;  Location: [facility];  Service: Orthopedics;  Laterality: Right;  . TOTAL KNEE ARTHROPLASTY Left 12/07/2017  . TOTAL KNEE ARTHROPLASTY Left 12/07/2017   Procedure: LEFT TOTAL KNEE ARTHROPLASTY;  Surgeon: Newt Minion, MD;  Location: [facility];  Service: Orthopedics;  Laterality: Left;  . TRACHEOSTOMY TUBE PLACEMENT N/A 09/20/2017   Procedure: AWAKE INTUBATION WITH ANESTHESIA WITH VIDEO ASSISTANCE;  Surgeon: Helayne Seminole, MD;  Location: [facility];  Service: ENT;  Laterality: N/A;  . TRANSTHORACIC ECHOCARDIOGRAM  08/2014   Normal LV size and function.  Mild LVH.  EF 55-60%.   Normal regional wall motion.  GR 1 DD.  Normal RV size and function .  [name] TUBAL LIGATION     with reversal in 1994  . VENTRAL HERNIA REPAIR     Social History   Occupational History  . Occupation: DISABLED  Tobacco Use  . Smoking status: Never Smoker  . Smokeless tobacco: Never Used  Substance and Sexual Activity  . Alcohol use: No    Alcohol/week: 0.0 standard drinks  . Drug use: No  . Sexual activity: Yes    Birth control/protection: Surgical

## 2019-05-08 ENCOUNTER — Other Ambulatory Visit: Payer: Self-pay | Admitting: Endocrinology

## 2019-05-08 DIAGNOSIS — Z794 Long term (current) use of insulin: Secondary | ICD-10-CM

## 2019-05-08 DIAGNOSIS — E1129 Type 2 diabetes mellitus with other diabetic kidney complication: Secondary | ICD-10-CM

## 2019-05-08 NOTE — Telephone Encounter (Signed)
Please advise if you agree with this requested change

## 2019-05-08 NOTE — Telephone Encounter (Signed)
I have refilled x 1 (Lantus) F/u is due

## 2019-05-16 ENCOUNTER — Other Ambulatory Visit: Payer: Self-pay

## 2019-05-16 ENCOUNTER — Ambulatory Visit (INDEPENDENT_AMBULATORY_CARE_PROVIDER_SITE_OTHER): Payer: 59

## 2019-05-16 ENCOUNTER — Ambulatory Visit (INDEPENDENT_AMBULATORY_CARE_PROVIDER_SITE_OTHER): Payer: 59 | Admitting: Podiatry

## 2019-05-16 VITALS — Temp 97.5°F

## 2019-05-16 DIAGNOSIS — M2041 Other hammer toe(s) (acquired), right foot: Secondary | ICD-10-CM | POA: Diagnosis not present

## 2019-05-16 DIAGNOSIS — Z9889 Other specified postprocedural states: Secondary | ICD-10-CM | POA: Diagnosis not present

## 2019-05-16 DIAGNOSIS — M2042 Other hammer toe(s) (acquired), left foot: Secondary | ICD-10-CM | POA: Diagnosis not present

## 2019-05-16 NOTE — Patient Instructions (Signed)
Pre-Operative Instructions  Congratulations, you have decided to take an important step towards improving your quality of life.  You can be assured that the doctors and staff at [facility] will be with you every step of the way.  Here are some important things you should know:  1. Plan to be at the surgery center/hospital at least 1 (one) hour prior to your scheduled time, unless otherwise directed by the surgical center/hospital staff.  You must have a responsible adult accompany you, remain during the surgery and drive you home.  Make sure you have directions to the surgical center/hospital to ensure you arrive on time. 2. If you are having surgery at [facility] or [facility] hospitals, you will need a copy of your medical history and physical form from your family physician within one month prior to the date of surgery. We will give you a form for your primary physician to complete.  3. We make every effort to accommodate the date you request for surgery.  However, there are times where surgery dates or times have to be moved.  We will contact you as soon as possible if a change in schedule is required.   4. No aspirin/ibuprofen for one week before surgery.  If you are on aspirin, any non-steroidal anti-inflammatory medications (Mobic, Aleve, Ibuprofen) should not be taken seven (7) days prior to your surgery.  You make take Tylenol for pain prior to surgery.  5. Medications - If you are taking daily heart and blood pressure medications, seizure, reflux, allergy, asthma, anxiety, pain or diabetes medications, make sure you notify the surgery center/hospital before the day of surgery so they can tell you which medications you should take or avoid the day of surgery. 6. No food or drink after midnight the night before surgery unless directed otherwise by surgical center/hospital staff. 7. No alcoholic beverages 24-hours prior to surgery.  No smoking 24-hours prior or 24-hours after  surgery. 8. Wear loose pants or shorts. They should be loose enough to fit over bandages, boots, and casts. 9. Don't wear slip-on shoes. Sneakers are preferred. 10. Bring your boot with you to the surgery center/hospital.  Also bring crutches or a walker if your physician has prescribed it for you.  If you do not have this equipment, it will be provided for you after surgery. 11. If you have not been contacted by the surgery center/hospital by the day before your surgery, call to confirm the date and time of your surgery. 12. Leave-time from work may vary depending on the type of surgery you have.  Appropriate arrangements should be made prior to surgery with your employer. 13. Prescriptions will be provided immediately following surgery by your doctor.  Fill these as soon as possible after surgery and take the medication as directed. Pain medications will not be refilled on weekends and must be approved by the doctor. 14. Remove nail polish on the operative foot and avoid getting pedicures prior to surgery. 15. Wash the night before surgery.  The night before surgery wash the foot and leg well with water and the antibacterial soap provided. Be sure to pay special attention to beneath the toenails and in between the toes.  Wash for at least three (3) minutes. Rinse thoroughly with water and dry well with a towel.  Perform this wash unless told not to do so by your physician.  Enclosed: 1 Ice pack (please put in freezer the night before surgery)   1 Hibiclens skin cleaner     Pre-op instructions  If you have any questions regarding the instructions, please do not hesitate to call our office.  [place]: [address], [place], [place] [address] -- [phone]  [place]: [address] [place], [place] [address] -- [phone]  [place]: [address]  [place], [place] [address] -- [phone]  [place]: [address], [address], [place], [place] [address] -- [phone]  Website:  [email or web address] 

## 2019-05-19 NOTE — Progress Notes (Signed)
   Subjective:  Patient presents today status post hammertoe repair digits 2-5 right. DOS: 04/12/2019. She states she is doing well. She denies any significant pain or modifying factors. She reports trying to wear regular shoes and states it felt good to her foot. Patient is here for further evaluation and treatment.    Past Medical History:  Diagnosis Date  . Abnormal SPEP 04/17/2014  . Acute left ankle pain 01/26/2017  . ANEMIA-UNSPECIFIED 09/18/2009  . Anxiety   . Arthritis   . Chronic diastolic heart failure, NYHA class 2 (HCC)    Normal LVEDP by May 2018  . COPD (chronic obstructive pulmonary disease) ([place])   . Depression   . DIABETES MELLITUS, TYPE II 08/21/2006  . Diabetic osteomyelitis ([place]) 05/29/2015  . Fracture of 5th metatarsal    non union  . GERD 63/33/5456   Had fundoplication  . GOUT 08/20/2010  . Hammer toe of right foot    2-5th toes  . Hx of umbilical hernia repair   . HYPERLIPIDEMIA 08/21/2006  . HYPERTENSION 08/21/2006  . Infection of wound due to methicillin resistant Staphylococcus aureus (MRSA)   . Internal hemorrhoids   . Multiple allergies 10/14/2016  . OBESITY 06/04/2009  . Onychomycosis 10/27/2015  . Osteomyelitis of left foot ([place]) 05/29/2015  . Pulmonary sarcoidosis ([place])    Followed locally by pulmonology, but also by Dr. Casper Harrison at [facility]  . Right knee pain 01/26/2017  . Vocal cord dysfunction   . Wears partial dentures       Objective/Physical Exam Neurovascular status intact.  Skin incisions appear to be well coapted. No sign of infectious process noted. No dehiscence. No active bleeding noted. Moderate edema noted to the surgical extremity. Hammertoe contracture deformity noted to digits 2-4 of the left foot.   Radiographic Exam:  Orthopedic hardware and osteotomies sites appear to be stable with routine healing.  Assessment: 1. s/p hammertoe repair digits 2-5 right. DOS: 04/12/2019 2. Hammertoe contracture digits 2-4 left -  recurrent    Plan of Care:  1. Patient was evaluated. X-rays reviewed 2. Today we discussed the conservative versus surgical management of the presenting pathology. The patient opts for surgical management. All possible complications and details of the procedure were explained. All patient questions were answered. No guarantees were expressed or implied. 3. Authorization for surgery was initiated today. Surgery will consist of PIPJ arthrodesis with internal screw fixation digits 2, 3 and 4 left.  4. Resume wearing good shoe gear.  5. Return to clinic one week post op.   Going to the beach August 28.    Edrick Kins, DPM [facility]  Dr. Edrick Kins, [place]                                        [place], [place] [address]                Office [phone]  Fax [phone]

## 2019-05-21 ENCOUNTER — Other Ambulatory Visit: Payer: Self-pay | Admitting: Cardiology

## 2019-05-28 ENCOUNTER — Other Ambulatory Visit: Payer: Self-pay

## 2019-05-28 ENCOUNTER — Ambulatory Visit
Admission: RE | Admit: 2019-05-28 | Discharge: 2019-05-28 | Disposition: A | Discharge status: Home / Self Care | Payer: 59 | Source: Ambulatory Visit | Attending: Obstetrics and Gynecology | Admitting: Obstetrics and Gynecology

## 2019-05-28 ENCOUNTER — Ambulatory Visit (INDEPENDENT_AMBULATORY_CARE_PROVIDER_SITE_OTHER): Payer: 59 | Admitting: Orthopedic Surgery

## 2019-05-28 ENCOUNTER — Encounter: Payer: Self-pay | Admitting: Orthopedic Surgery

## 2019-05-28 VITALS — Ht 66.0 in | Wt 255.9 lb

## 2019-05-28 DIAGNOSIS — M7541 Impingement syndrome of right shoulder: Secondary | ICD-10-CM

## 2019-05-28 DIAGNOSIS — M7542 Impingement syndrome of left shoulder: Secondary | ICD-10-CM

## 2019-05-28 DIAGNOSIS — Z1231 Encounter for screening mammogram for malignant neoplasm of breast: Secondary | ICD-10-CM

## 2019-05-28 LAB — HM MAMMOGRAPHY: HM Mammogram: NORMAL (ref 0–4)

## 2019-05-29 ENCOUNTER — Encounter: Payer: Self-pay | Admitting: Orthopedic Surgery

## 2019-05-29 DIAGNOSIS — M7542 Impingement syndrome of left shoulder: Secondary | ICD-10-CM | POA: Diagnosis not present

## 2019-05-29 MED ORDER — METHYLPREDNISOLONE ACETATE 40 MG/ML IJ SUSP
40.0000 mg | INTRAMUSCULAR | Status: AC | PRN
  Administered 2019-05-29: 40 mg via INTRA_ARTICULAR

## 2019-05-29 MED ORDER — LIDOCAINE HCL 1 % IJ SOLN
5.0000 mL | INTRAMUSCULAR | Status: AC | PRN
  Administered 2019-05-29: 5 mL

## 2019-05-29 NOTE — Progress Notes (Signed)
Office Visit Note   Patient: [name]           Date of Birth: May 21, 1961           MRN: [id number] Visit Date: 05/28/2019              Requested by: [name], MD [place],  [place] [id number] PCP: [name], MD  Chief Complaint  Patient presents with  . Left Shoulder - Routine Post Op    03/14/2019 left shoulder artho & deb 04/30/2019 right inj      HPI: Patient is a 58 year old woman who presents complaining of left shoulder pain she is 3 months status post left shoulder arthroscopy and debridement.  Most recently she underwent a subacromial injection for the right shoulder and she states that this helped a lot.  She complains of pain radiating down into her biceps.  She states she cannot sleep at night due to her left shoulder pain radiating down her arm pain 7/10.  She states she is tried ice and heat without relief.  Assessment & Plan: Visit Diagnoses:  1. Impingement syndrome of right shoulder   2. Impingement syndrome of left shoulder     Plan: The left shoulder was injected in subacromial space reevaluate in 4 weeks.  Follow-Up Instructions: Return in about 4 weeks (around 06/25/2019).   Ortho Exam  Patient is alert, oriented, no adenopathy, well-dressed, normal affect, normal respiratory effort. Examination patient has pain with Neer and Hawkins impingement test pain with a drop arm test on the left.  AC joint is nontender to palpation.  No focal motor weakness.  Imaging: Mm 3d Screen Breast Bilateral  Result Date: 05/29/2019 CLINICAL DATA:  Screening. EXAM: DIGITAL SCREENING BILATERAL MAMMOGRAM WITH TOMO AND CAD COMPARISON:  Previous exam(s). ACR Breast Density Category c: The breast tissue is heterogeneously dense, which may obscure small masses. FINDINGS: There are no findings suspicious for malignancy. Images were processed with CAD. IMPRESSION: No mammographic evidence of malignancy. A result letter of this screening  mammogram will be mailed directly to the patient. RECOMMENDATION: Screening mammogram in one year. (Code:SM-B-01Y) BI-RADS CATEGORY  1: Negative. Electronically Signed   By: Nolon Nations M.D.   On: 05/29/2019 08:33   No images are attached to the encounter.  Labs: Lab Results  Component Value Date   HGBA1C 8.2 (H) 03/12/2019   HGBA1C 8.2 (A) 08/25/2018   HGBA1C 7.6 (A) 04/11/2018   ESRSEDRATE 28 06/16/2017   ESRSEDRATE 5 01/26/2017   ESRSEDRATE 27 10/14/2016   CRP 27.1 (H) 06/16/2017   CRP 17.3 (H) 01/26/2017   CRP 24.0 (H) 10/14/2016   LABURIC 5.1 01/26/2017   REPTSTATUS 09/22/2017 FINAL 09/20/2017   GRAMSTAIN  08/14/2017    MODERATE WBC PRESENT, PREDOMINANTLY PMN MODERATE BUDDING YEAST SEEN    CULT NO GROUP A STREP (S.PYOGENES) ISOLATED 09/20/2017   LABORGA PSEUDOMONAS FLUORESCENS 08/14/2017     Lab Results  Component Value Date   ALBUMIN 4.1 12/04/2018   ALBUMIN 4.4 04/05/2018   ALBUMIN 4.2 02/08/2018   LABURIC 5.1 01/26/2017    Lab Results  Component Value Date   MG 1.7 09/23/2017   MG 1.9 09/22/2017   MG 1.8 09/21/2017   No results found for: VD25OH  No results found for: PREALBUMIN CBC EXTENDED Latest Ref Rng & Units 04/10/2019 03/12/2019 12/13/2018  WBC 4.0 - 10.5 K/uL 9.7 9.4 17.5(H)  RBC 3.87 - 5.11 MIL/uL 3.76(L) 3.68(L) 3.95  HGB 12.0 -  15.0 g/dL 10.2(L) 10.0(L) 10.5(L)  HCT 36.0 - 46.0 % 33.3(L) 33.1(L) 32.6(L)  PLT 150 - 400 K/uL 341 311 422.0(H)  NEUTROABS 1.4 - 7.7 K/uL - - -  LYMPHSABS 0.7 - 4.0 K/uL - - -     Body mass index is 41.31 kg/m.  Orders:  No orders of the defined types were placed in this encounter.  No orders of the defined types were placed in this encounter.    Procedures: Large Joint Inj: L subacromial bursa on 05/29/2019 12:30 PM Indications: diagnostic evaluation and pain Details: 22 G 1.5 in needle, posterior approach  Arthrogram: No  Medications: 5 mL lidocaine 1 %; 40 mg methylPREDNISolone acetate 40 MG/ML  Outcome: tolerated well, no immediate complications Procedure, treatment alternatives, risks and benefits explained, specific risks discussed. Consent was given by the patient. Immediately prior to procedure a time out was called to verify the correct patient, procedure, equipment, support staff and site/side marked as required. Patient was prepped and draped in the usual sterile fashion.      Clinical Data: No additional findings.  ROS:  All other systems negative, except as noted in the HPI. Review of Systems  Objective: Vital Signs: Ht 5\' 6"  (1.676 m)   Wt 255 lb 15.2 oz (116.1 kg)   BMI 41.31 kg/m   Specialty Comments:  No specialty comments available.  PMFS History: Patient Active Problem List   Diagnosis Date Noted  . Nontraumatic complete tear of left rotator cuff   . Impingement syndrome of left shoulder   . Diabetic neuropathy ([place]) 02/20/2019  . Severe persistent asthma 01/04/2019  . Subcortical microvascular ischemic occlusive disease 07/12/2018  . Rapid palpitations 07/12/2018  . Impingement of left ankle joint 06/26/2018  . Constipation 04/21/2018  . Anxiety 04/05/2018  . Total knee replacement status, left 12/07/2017  . Dysphagia 09/20/2017  . Epiglottitis   . Plantar fasciitis, right 07/13/2017  . S/P total knee arthroplasty, right 06/29/2017  . Osteoarthrosis, localized, primary, knee, right   . Sprain of calcaneofibular ligament of right ankle 06/06/2017  . Osteoarthritis of right knee 01/26/2017  . Onychomycosis 10/27/2015  . CKD (chronic kidney disease) stage 3, GFR 30-59 ml/min (HCC) 04/27/2015  . MRSA (methicillin resistant staph aureus) culture positive 03/27/2015  . History of osteomyelitis 03/27/2015  . Fatty liver 09/30/2014  . Hot flashes 07/15/2014  . Abnormal SPEP 04/17/2014  . Fracture of left leg 04/17/2014  . Cushingoid side effect of steroids ([place]) 04/17/2014  . Internal hemorrhoids   . Major depression in full remission ([place])   .  Preoperative clearance 03/25/2014  . Obstructive chronic bronchitis without exacerbation ([place]) 09/18/2013  . Chest pain 04/11/2013  . Hypertensive heart disease with chronic diastolic congestive heart failure ([place])   . Solitary pulmonary nodule, on CT 02/2013 - stable over 2 years in 2015 02/20/2013  . Gout 08/20/2010  . Anemia 09/18/2009  . Morbid obesity ([place]) 06/04/2009  . Sleep apnea 04/21/2009  . Sarcoidosis of lung ([place]) 04/10/2007  . Hyperlipidemia associated with type 2 diabetes mellitus ([place]) 08/21/2006  . Hypertension associated with diabetes ([place]) 08/21/2006  . GERD 08/21/2006  . Type II diabetes mellitus with renal manifestations ([place]) 08/21/2006   Past Medical History:  Diagnosis Date  . Abnormal SPEP 04/17/2014  . Acute left ankle pain 01/26/2017  . ANEMIA-UNSPECIFIED 09/18/2009  . Anxiety   . Arthritis   . Chronic diastolic heart failure, NYHA class 2 (HCC)    Normal LVEDP by May 2018  . COPD (  chronic obstructive pulmonary disease) ([place])   . Depression   . DIABETES MELLITUS, TYPE II 08/21/2006  . Diabetic osteomyelitis ([place]) 05/29/2015  . Fracture of 5th metatarsal    non union  . GERD 82/95/6213   Had fundoplication  . GOUT 08/20/2010  . Hammer toe of right foot    2-5th toes  . Hx of umbilical hernia repair   . HYPERLIPIDEMIA 08/21/2006  . HYPERTENSION 08/21/2006  . Infection of wound due to methicillin resistant Staphylococcus aureus (MRSA)   . Internal hemorrhoids   . Multiple allergies 10/14/2016  . OBESITY 06/04/2009  . Onychomycosis 10/27/2015  . Osteomyelitis of left foot ([place]) 05/29/2015  . Pulmonary sarcoidosis ([place])    Followed locally by pulmonology, but also by Dr. Casper Harrison at [facility]  . Right knee pain 01/26/2017  . Vocal cord dysfunction   . Wears partial dentures     Family History  Problem Relation Age of Onset  . Diabetes Father   . Heart attack Father 30  . Coronary artery disease Father   . Heart failure Father   . Throat  cancer Father   . COPD Mother   . Emphysema Mother   . Asthma Mother   . Heart failure Mother   . Breast cancer Mother   . Heart attack Maternal Grandfather   . Sarcoidosis Maternal Uncle   . Lung cancer Brother   . Diabetes Brother   . Colon cancer Neg Hx   . Rectal cancer Neg Hx     Past Surgical History:  Procedure Laterality Date  . ABDOMINAL HYSTERECTOMY    . APPENDECTOMY    . BLADDER SUSPENSION  11/11/2011   Procedure: TRANSVAGINAL TAPE (TVT) PROCEDURE;  Surgeon: Olga Millers, MD;  Location: [facility];  Service: Gynecology;  Laterality: N/A;  . CAROTIDS  02/18/11   CAROTID DUPLEX; VERTEBRALS ARE PATENT WITH ANTEGRADE FLOW. ICA/CCA RATIO 1.61 ON RIGHT AND 0.75 ON LEFT  . CHOLECYSTECTOMY  1984  . CYSTOSCOPY  11/11/2011   Procedure: CYSTOSCOPY;  Surgeon: Olga Millers, MD;  Location: [facility];  Service: Gynecology;  Laterality: N/A;  . EXTUBATION (ENDOTRACHEAL) IN OR N/A 09/23/2017   Procedure: EXTUBATION (ENDOTRACHEAL) IN OR;  Surgeon: Helayne Seminole, MD;  Location: [facility];  Service: ENT;  Laterality: N/A;  . FIBEROPTIC LARYNGOSCOPY AND TRACHEOSCOPY N/A 09/23/2017   Procedure: FLEXIBLE FIBEROPTIC LARYNGOSCOPY;  Surgeon: Helayne Seminole, MD;  Location: [facility];  Service: ENT;  Laterality: N/A;  . FRACTURE SURGERY     foot  . HALLUX FUSION Left 10/02/2018   Procedure: HALLUX ARTHRODESIS;  Surgeon: Edrick Kins, DPM;  Location: [facility];  Service: Podiatry;  Laterality: Left;  . HAMMER TOE SURGERY Left 10/02/2018   Procedure: HAMMER TOE CORRECTION 2ND 3RD 4RD FIFTH TOE;  Surgeon: Edrick Kins, DPM;  Location: [facility];  Service: Podiatry;  Laterality: Left;  . HAMMER TOE SURGERY Right 04/12/2019   Procedure: HAMMER TOE CORRECTION, 2ND, 3RD, 4TH AND 5TH TOES OF RIGHT FOOT;  Surgeon: Edrick Kins, DPM;  Location: [facility];  Service: Podiatry;  Laterality: Right;  . HERNIA REPAIR    . I&D EXTREMITY Left 06/27/2015   Procedure: Partial Excision Left Calcaneus, Place Antibiotic  Beads, and Wound VAC;  Surgeon: Newt Minion, MD;  Location: [facility];  Service: Orthopedics;  Laterality: Left;  . KNEE ARTHROSCOPY     right  . LEFT AND RIGHT HEART CATHETERIZATION WITH CORONARY ANGIOGRAM N/A 04/23/2013   Procedure: LEFT AND  RIGHT HEART CATHETERIZATION WITH CORONARY ANGIOGRAM;  Surgeon: Leonie Man, MD;  Location: [facility];  Service: Cardiovascular;  Laterality: N/A;  . LEFT HEART CATH AND CORONARY ANGIOGRAPHY N/A 03/11/2017   Procedure: Left Heart Cath and Coronary Angiography;  Surgeon: Sherren Mocha, MD;  Location: [facility]; angiographically minimal CAD in the LAD otherwise normal.;  Normal LVEDP.  FALSE POSITIVE MYOVIEW  . LEFT HEART CATH AND CORONARY ANGIOGRAPHY  07/20/2010   LVEF 50-55% WITH VERY MILD GLOBAL HYPOKINESIA; ESSENTIALLY NORMAL CORONARY ARTERIES; NORMAL LV FUNCTION  . METATARSAL OSTEOTOMY WITH OPEN REDUCTION INTERNAL FIXATION (ORIF) METATARSAL WITH FUSION Left 04/09/2014   Procedure: LEFT FOOT FRACTURE OPEN TREATMENT METATARSAL INCLUDES INTERNAL FIXATION EACH;  Surgeon: Lorn Junes, MD;  Location: [facility];  Service: Orthopedics;  Laterality: Left;  . NISSEN FUNDOPLICATION  6387  . NM MYOVIEW LTD  03/09/2013   Lexiscan --> EF 50%; NORMAL MYOCARDIAL PERFUSION STUDY - breast attenuation  . NM MYOVIEW LTD  03/10/2017   : Moderate size "stress-induced "perfusion defect at the apex as well as "ill-defined stress-induced perfusion defect in the lateral wall.  EF 55%.  INTERMEDIATE risk. -->  FALSE POSITIVE  . Right and left CARDIAC CATHETERIZATION  04/23/2013   Angiographic normal coronaries; LVEDP 20 mmHg, PCWP 12-14 mmHg, RAP 12 mmHg.; Fick CO/CI 4.9/2.2  . SHOULDER ARTHROSCOPY Left 03/14/2019   Procedure: LEFT SHOULDER ARTHROSCOPY, DEBRIDEMENT, AND DECOMPRESSION;  Surgeon: Newt Minion, MD;  Location: [facility];  Service: Orthopedics;  Laterality: Left;  . TOTAL KNEE ARTHROPLASTY Right 06/29/2017   Procedure: RIGHT TOTAL KNEE  ARTHROPLASTY;  Surgeon: Newt Minion, MD;  Location: [facility];  Service: Orthopedics;  Laterality: Right;  . TOTAL KNEE ARTHROPLASTY Left 12/07/2017  . TOTAL KNEE ARTHROPLASTY Left 12/07/2017   Procedure: LEFT TOTAL KNEE ARTHROPLASTY;  Surgeon: Newt Minion, MD;  Location: [facility];  Service: Orthopedics;  Laterality: Left;  . TRACHEOSTOMY TUBE PLACEMENT N/A 09/20/2017   Procedure: AWAKE INTUBATION WITH ANESTHESIA WITH VIDEO ASSISTANCE;  Surgeon: Helayne Seminole, MD;  Location: [facility];  Service: ENT;  Laterality: N/A;  . TRANSTHORACIC ECHOCARDIOGRAM  08/2014   Normal LV size and function.  Mild LVH.  EF 55-60%.  Normal regional wall motion.  GR 1 DD.  Normal RV size and function .  [name] TUBAL LIGATION     with reversal in 1994  . VENTRAL HERNIA REPAIR     Social History   Occupational History  . Occupation: DISABLED  Tobacco Use  . Smoking status: Never Smoker  . Smokeless tobacco: Never Used  Substance and Sexual Activity  . Alcohol use: No    Alcohol/week: 0.0 standard drinks  . Drug use: No  . Sexual activity: Yes    Birth control/protection: Surgical

## 2019-06-03 ENCOUNTER — Other Ambulatory Visit: Payer: Self-pay | Admitting: Family Medicine

## 2019-06-06 ENCOUNTER — Ambulatory Visit (INDEPENDENT_AMBULATORY_CARE_PROVIDER_SITE_OTHER): Payer: 59 | Admitting: Family

## 2019-06-06 ENCOUNTER — Encounter: Payer: Self-pay | Admitting: Family

## 2019-06-06 DIAGNOSIS — M7542 Impingement syndrome of left shoulder: Secondary | ICD-10-CM

## 2019-06-06 IMAGING — CT CT HEAD W/O CM
4 series · 17 of 47 positions shown, 19 images · non-contrast
Comparison: MRI 02/18/2011, CT brain 02/17/2011

CLINICAL DATA: Persistent occipital headache

EXAM:
CT HEAD WITHOUT CONTRAST
TECHNIQUE: Contiguous axial images were obtained from the base of the skull
through the vertex without intravenous contrast.

[Series 3: head without · axial · non-contrast · 0.45mm/px · z∈[-70,+50]mm · 7 of 33 slices shown, 9 images]
[im 5/33  brain]
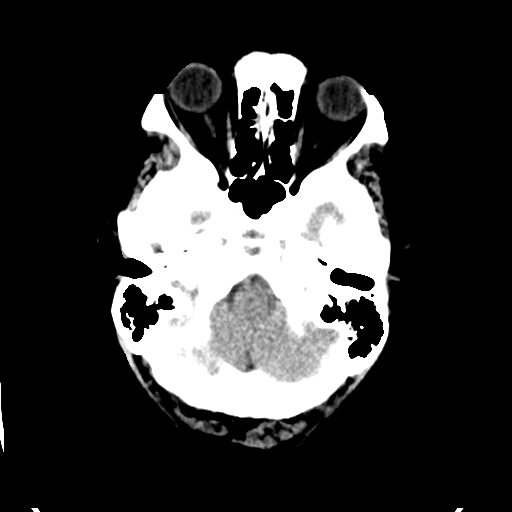
[im 5/33  bone]
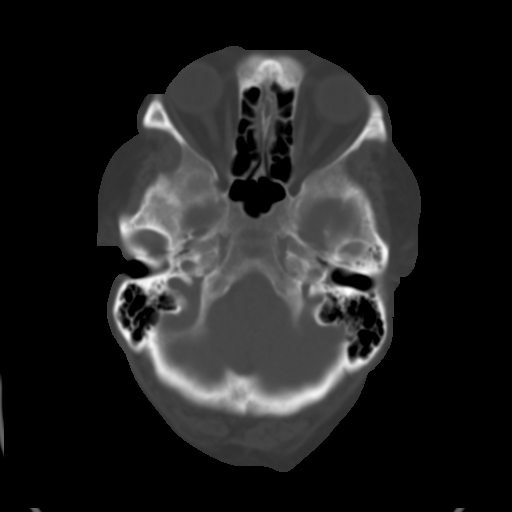
[im 9/33  brain]
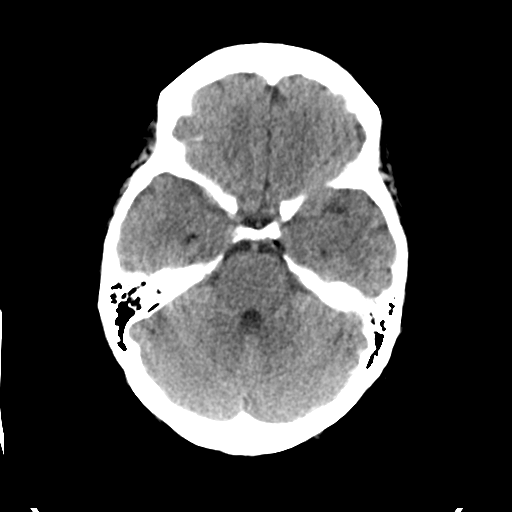
[im 13/33  brain]
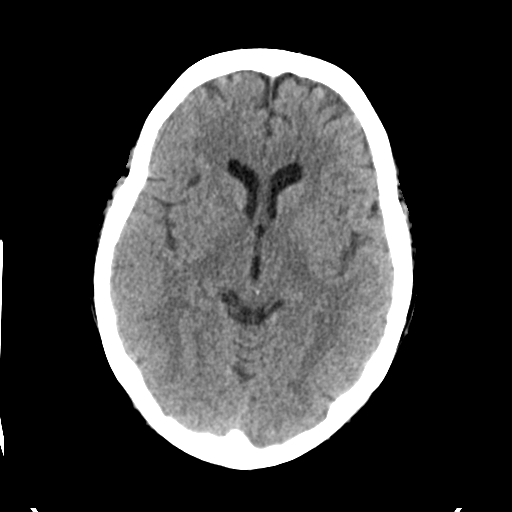
[im 17/33  brain]
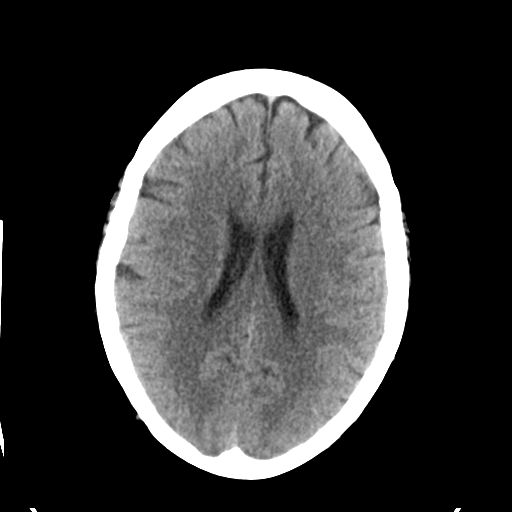
[im 21/33  brain]
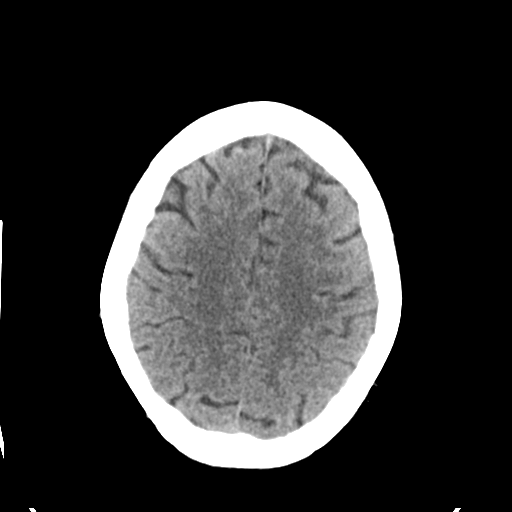
[im 21/33  bone]
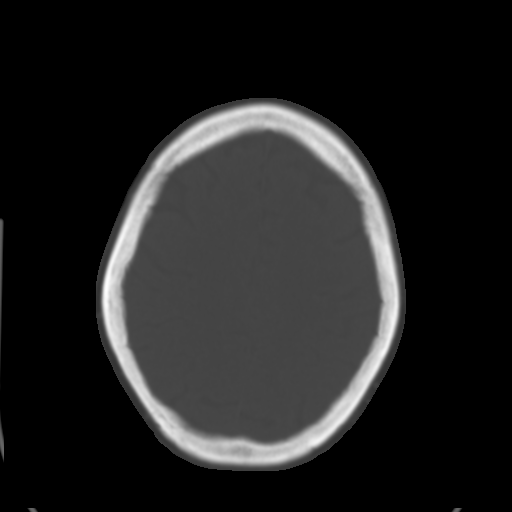
[im 25/33  brain]
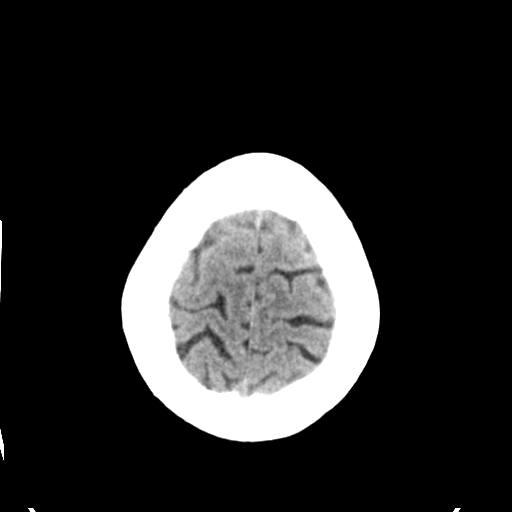
[im 29/33  brain]
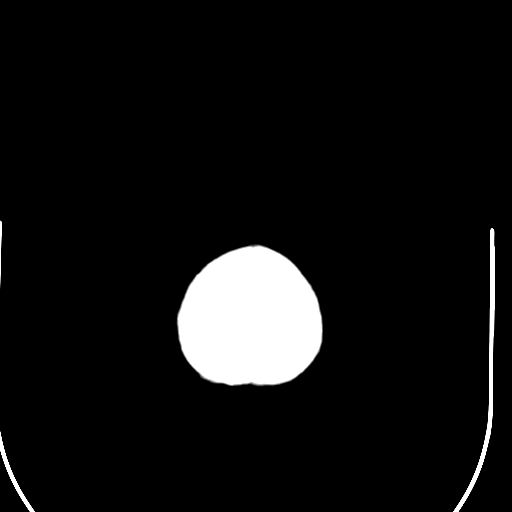

[Series 4: head bone · axial · 0.45mm/px · z∈[-74,-18]mm · 4 of 81 slices shown]
[im 9/81  bone]
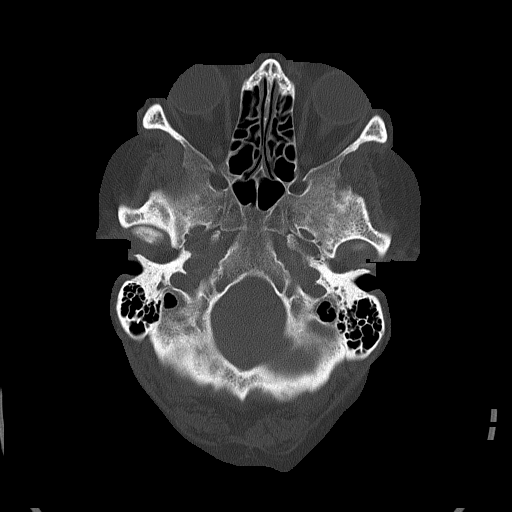
[im 17/81  bone]
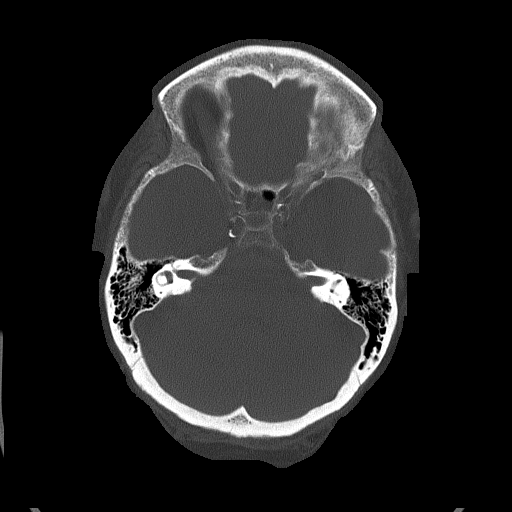
[im 25/81  bone]
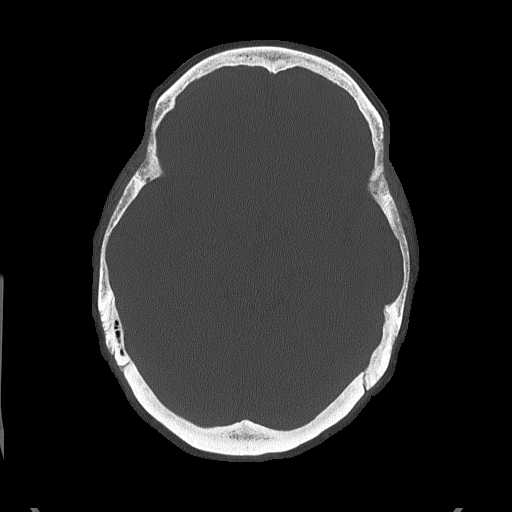
[im 37/81  bone]
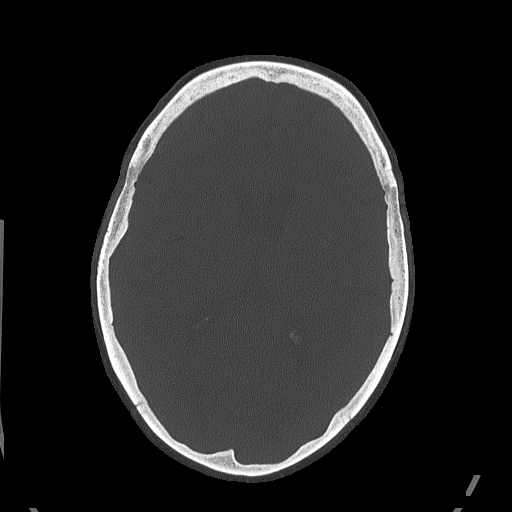

[Series 5: head without cor · coronal · non-contrast · 0.32mm/px · 3 of 67 slices shown]
[im 23/67  brain]
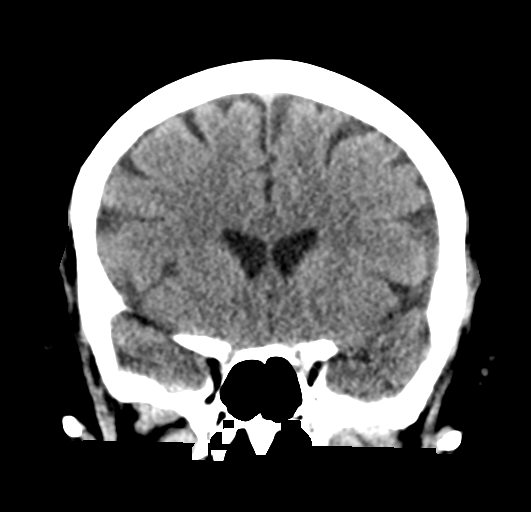
[im 30/67  brain]
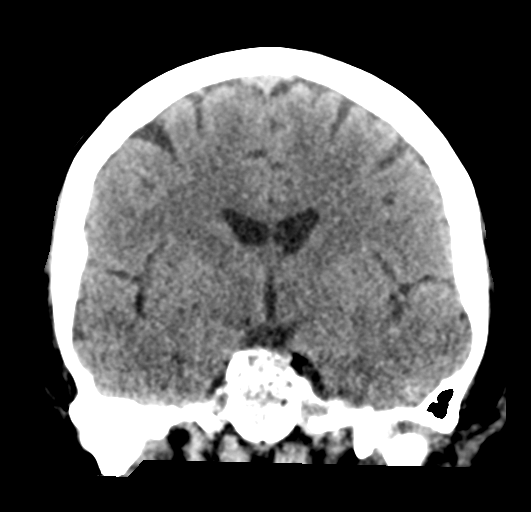
[im 37/67  brain]
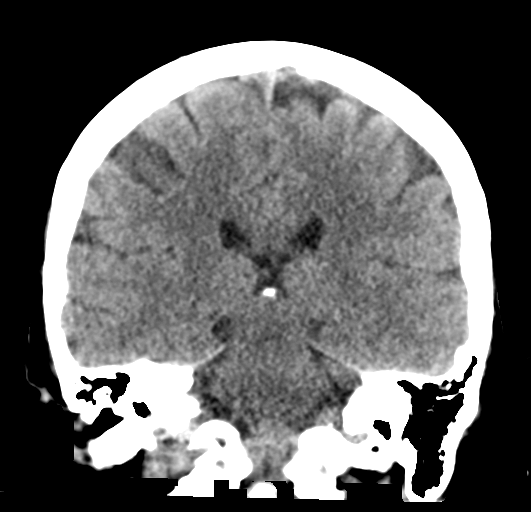

[Series 6: head without sag · sagittal · non-contrast · 0.29mm/px · 3 of 53 slices shown]
[im 18/53  brain]
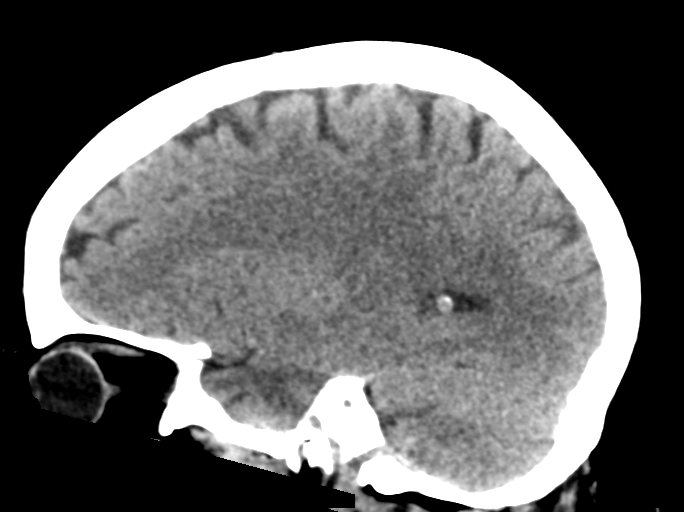
[im 27/53  brain]
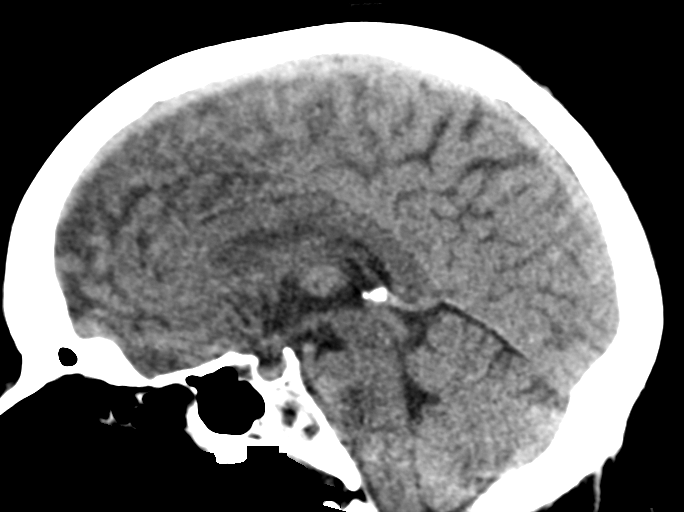
[im 35/53  brain]
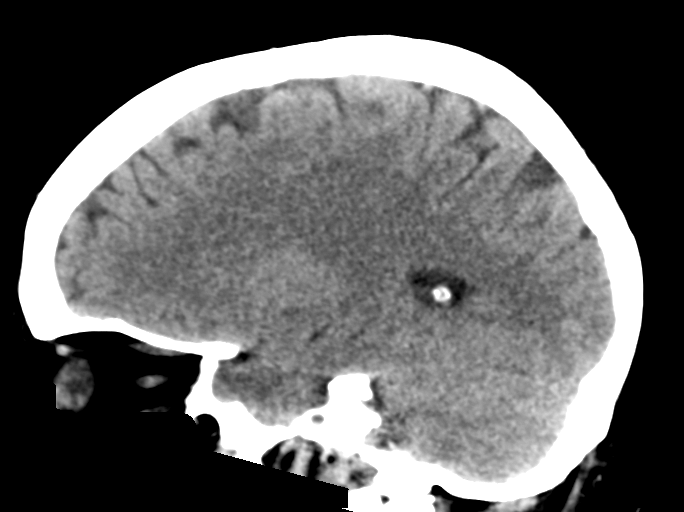

[17 of 47 positions shown; findings below may reference images not displayed]

FINDINGS: Brain: No evidence of acute infarction, hemorrhage, hydrocephalus,
extra-axial collection or mass lesion/mass effect.

Vascular: No hyperdense vessel . Carotid vascular calcification

Skull: Normal. Negative for fracture or focal lesion.

Sinuses/Orbits: No acute finding.

Other: None
IMPRESSION: Negative non contrasted CT appearance of the brain

## 2019-06-06 MED ORDER — PREDNISONE 50 MG PO TABS: ORAL_TABLET | ORAL | 0 refills | Status: DC

## 2019-06-06 MED ORDER — HYDROCODONE-ACETAMINOPHEN 5-325 MG PO TABS: 1.0000 | ORAL_TABLET | Freq: Four times a day (QID) | ORAL | 0 refills | Status: DC | PRN

## 2019-06-06 NOTE — Progress Notes (Signed)
Office Visit Note   Patient: [name]           Date of Birth: 04/08/1961           MRN: [id number] Visit Date: 06/06/2019              Requested by: [name], MD [place],  [place] [address] PCP: [name], MD  No chief complaint on file.     HPI: Patient is a 58 year old woman who presents complaining of left shoulder pain she is 3 months status post left shoulder arthroscopy and debridement.  She underwent a subacromial injection for the left shoulder about a week ago she states this did not provide her with any relief.  She is having excruciating pain this radiates from her biceps down her forearm.  Intermittent numbness and tingling in all of her fingers.  The pain prevents her from sleeping well at night cannot get comfortable.  Pain with above head reaching essentially pain with any range of motion of her left shoulder difficulty getting her arm above her head due to pain.   She states she is tried ice and heat without relief.  She cannot take NSAIDs.  Assessment & Plan: Visit Diagnoses:  No diagnosis found.  Plan: Offered repeat injection patient declined.  We will set her up with physical therapy.  Did provide a Pennsaid sample as well.  We will provide her with a one-time prescription for hydrocodone.  Follow-Up Instructions: No follow-ups on file.   Ortho Exam  Patient is alert, oriented, no adenopathy, well-dressed, normal affect, normal respiratory effort. Examination patient has pain with Neer and Hawkins impingement test pain with a drop arm test on the left.  AC joint is nontender to palpation.  No focal motor weakness.  Imaging: No results found. No images are attached to the encounter.  Labs: Lab Results  Component Value Date   HGBA1C 8.2 (H) 03/12/2019   HGBA1C 8.2 (A) 08/25/2018   HGBA1C 7.6 (A) 04/11/2018   ESRSEDRATE 28 06/16/2017   ESRSEDRATE 5 01/26/2017   ESRSEDRATE 27 10/14/2016   CRP 27.1 (H)  06/16/2017   CRP 17.3 (H) 01/26/2017   CRP 24.0 (H) 10/14/2016   LABURIC 5.1 01/26/2017   REPTSTATUS 09/22/2017 FINAL 09/20/2017   GRAMSTAIN  08/14/2017    MODERATE WBC PRESENT, PREDOMINANTLY PMN MODERATE BUDDING YEAST SEEN    CULT NO GROUP A STREP (S.PYOGENES) ISOLATED 09/20/2017   LABORGA PSEUDOMONAS FLUORESCENS 08/14/2017     Lab Results  Component Value Date   ALBUMIN 4.1 12/04/2018   ALBUMIN 4.4 04/05/2018   ALBUMIN 4.2 02/08/2018   LABURIC 5.1 01/26/2017    Lab Results  Component Value Date   MG 1.7 09/23/2017   MG 1.9 09/22/2017   MG 1.8 09/21/2017   No results found for: VD25OH  No results found for: PREALBUMIN CBC EXTENDED Latest Ref Rng & Units 04/10/2019 03/12/2019 12/13/2018  WBC 4.0 - 10.5 K/uL 9.7 9.4 17.5(H)  RBC 3.87 - 5.11 MIL/uL 3.76(L) 3.68(L) 3.95  HGB 12.0 - 15.0 g/dL 10.2(L) 10.0(L) 10.5(L)  HCT 36.0 - 46.0 % 33.3(L) 33.1(L) 32.6(L)  PLT 150 - 400 K/uL 341 311 422.0(H)  NEUTROABS 1.4 - 7.7 K/uL - - -  LYMPHSABS 0.7 - 4.0 K/uL - - -     There is no height or weight on file to calculate BMI.  Orders:  No orders of the defined types were placed in this encounter.  No orders of  the defined types were placed in this encounter.    Procedures: No procedures performed  Clinical Data: No additional findings.  ROS:  All other systems negative, except as noted in the HPI. Review of Systems  Constitutional: Negative for chills and fever.  Musculoskeletal: Positive for arthralgias and myalgias. Negative for joint swelling.    Objective: Vital Signs: There were no vitals taken for this visit.  Specialty Comments:  No specialty comments available.  PMFS History: Patient Active Problem List   Diagnosis Date Noted  . Nontraumatic complete tear of left rotator cuff   . Impingement syndrome of left shoulder   . Diabetic neuropathy ([place]) 02/20/2019  . Severe persistent asthma 01/04/2019  . Subcortical microvascular ischemic occlusive disease  07/12/2018  . Rapid palpitations 07/12/2018  . Impingement of left ankle joint 06/26/2018  . Constipation 04/21/2018  . Anxiety 04/05/2018  . Total knee replacement status, left 12/07/2017  . Dysphagia 09/20/2017  . Epiglottitis   . Plantar fasciitis, right 07/13/2017  . S/P total knee arthroplasty, right 06/29/2017  . Osteoarthrosis, localized, primary, knee, right   . Sprain of calcaneofibular ligament of right ankle 06/06/2017  . Osteoarthritis of right knee 01/26/2017  . Onychomycosis 10/27/2015  . CKD (chronic kidney disease) stage 3, GFR 30-59 ml/min (HCC) 04/27/2015  . MRSA (methicillin resistant staph aureus) culture positive 03/27/2015  . History of osteomyelitis 03/27/2015  . Fatty liver 09/30/2014  . Hot flashes 07/15/2014  . Abnormal SPEP 04/17/2014  . Fracture of left leg 04/17/2014  . Cushingoid side effect of steroids ([place]) 04/17/2014  . Internal hemorrhoids   . Major depression in full remission ([place])   . Preoperative clearance 03/25/2014  . Obstructive chronic bronchitis without exacerbation ([place]) 09/18/2013  . Chest pain 04/11/2013  . Hypertensive heart disease with chronic diastolic congestive heart failure ([place])   . Solitary pulmonary nodule, on CT 02/2013 - stable over 2 years in 2015 02/20/2013  . Gout 08/20/2010  . Anemia 09/18/2009  . Morbid obesity ([place]) 06/04/2009  . Sleep apnea 04/21/2009  . Sarcoidosis of lung ([place]) 04/10/2007  . Hyperlipidemia associated with type 2 diabetes mellitus ([place]) 08/21/2006  . Hypertension associated with diabetes ([place]) 08/21/2006  . GERD 08/21/2006  . Type II diabetes mellitus with renal manifestations ([place]) 08/21/2006   Past Medical History:  Diagnosis Date  . Abnormal SPEP 04/17/2014  . Acute left ankle pain 01/26/2017  . ANEMIA-UNSPECIFIED 09/18/2009  . Anxiety   . Arthritis   . Chronic diastolic heart failure, NYHA class 2 (HCC)    Normal LVEDP by May 2018  . COPD (chronic obstructive pulmonary disease) ([place])   .  Depression   . DIABETES MELLITUS, TYPE II 08/21/2006  . Diabetic osteomyelitis ([place]) 05/29/2015  . Fracture of 5th metatarsal    non union  . GERD [id number]   Had fundoplication  . GOUT 08/20/2010  . Hammer toe of right foot    2-5th toes  . Hx of umbilical hernia repair   . HYPERLIPIDEMIA 08/21/2006  . HYPERTENSION 08/21/2006  . Infection of wound due to methicillin resistant Staphylococcus aureus (MRSA)   . Internal hemorrhoids   . Multiple allergies 10/14/2016  . OBESITY 06/04/2009  . Onychomycosis 10/27/2015  . Osteomyelitis of left foot ([place]) 05/29/2015  . Pulmonary sarcoidosis ([place])    Followed locally by pulmonology, but also by Dr. Casper Harrison at [facility]  . Right knee pain 01/26/2017  . Vocal cord dysfunction   . Wears partial dentures     Family  History  Problem Relation Age of Onset  . Diabetes Father   . Heart attack Father 63  . Coronary artery disease Father   . Heart failure Father   . Throat cancer Father   . COPD Mother   . Emphysema Mother   . Asthma Mother   . Heart failure Mother   . Breast cancer Mother   . Heart attack Maternal Grandfather   . Sarcoidosis Maternal Uncle   . Lung cancer Brother   . Diabetes Brother   . Colon cancer Neg Hx   . Rectal cancer Neg Hx     Past Surgical History:  Procedure Laterality Date  . ABDOMINAL HYSTERECTOMY    . APPENDECTOMY    . BLADDER SUSPENSION  11/11/2011   Procedure: TRANSVAGINAL TAPE (TVT) PROCEDURE;  Surgeon: Olga Millers, MD;  Location: [facility];  Service: Gynecology;  Laterality: N/A;  . CAROTIDS  02/18/11   CAROTID DUPLEX; VERTEBRALS ARE PATENT WITH ANTEGRADE FLOW. ICA/CCA RATIO 1.61 ON RIGHT AND 0.75 ON LEFT  . CHOLECYSTECTOMY  1984  . CYSTOSCOPY  11/11/2011   Procedure: CYSTOSCOPY;  Surgeon: Olga Millers, MD;  Location: [facility];  Service: Gynecology;  Laterality: N/A;  . EXTUBATION (ENDOTRACHEAL) IN OR N/A 09/23/2017   Procedure: EXTUBATION (ENDOTRACHEAL) IN OR;  Surgeon: Helayne Seminole, MD;  Location: [facility];  Service: ENT;  Laterality: N/A;  . FIBEROPTIC LARYNGOSCOPY AND TRACHEOSCOPY N/A 09/23/2017   Procedure: FLEXIBLE FIBEROPTIC LARYNGOSCOPY;  Surgeon: Helayne Seminole, MD;  Location: [facility];  Service: ENT;  Laterality: N/A;  . FRACTURE SURGERY     foot  . HALLUX FUSION Left 10/02/2018   Procedure: HALLUX ARTHRODESIS;  Surgeon: Edrick Kins, DPM;  Location: [facility];  Service: Podiatry;  Laterality: Left;  . HAMMER TOE SURGERY Left 10/02/2018   Procedure: HAMMER TOE CORRECTION 2ND 3RD 4RD FIFTH TOE;  Surgeon: Edrick Kins, DPM;  Location: [facility];  Service: Podiatry;  Laterality: Left;  . HAMMER TOE SURGERY Right 04/12/2019   Procedure: HAMMER TOE CORRECTION, 2ND, 3RD, 4TH AND 5TH TOES OF RIGHT FOOT;  Surgeon: Edrick Kins, DPM;  Location: [facility];  Service: Podiatry;  Laterality: Right;  . HERNIA REPAIR    . I&D EXTREMITY Left 06/27/2015   Procedure: Partial Excision Left Calcaneus, Place Antibiotic Beads, and Wound VAC;  Surgeon: Newt Minion, MD;  Location: [facility];  Service: Orthopedics;  Laterality: Left;  . KNEE ARTHROSCOPY     right  . LEFT AND RIGHT HEART CATHETERIZATION WITH CORONARY ANGIOGRAM N/A 04/23/2013   Procedure: LEFT AND RIGHT HEART CATHETERIZATION WITH CORONARY ANGIOGRAM;  Surgeon: Leonie Man, MD;  Location: [facility];  Service: Cardiovascular;  Laterality: N/A;  . LEFT HEART CATH AND CORONARY ANGIOGRAPHY N/A 03/11/2017   Procedure: Left Heart Cath and Coronary Angiography;  Surgeon: Sherren Mocha, MD;  Location: [facility]; angiographically minimal CAD in the LAD otherwise normal.;  Normal LVEDP.  FALSE POSITIVE MYOVIEW  . LEFT HEART CATH AND CORONARY ANGIOGRAPHY  07/20/2010   LVEF 50-55% WITH VERY MILD GLOBAL HYPOKINESIA; ESSENTIALLY NORMAL CORONARY ARTERIES; NORMAL LV FUNCTION  . METATARSAL OSTEOTOMY WITH OPEN REDUCTION INTERNAL FIXATION (ORIF) METATARSAL WITH FUSION Left 04/09/2014   Procedure: LEFT FOOT FRACTURE OPEN TREATMENT  METATARSAL INCLUDES INTERNAL FIXATION EACH;  Surgeon: Lorn Junes, MD;  Location: [facility];  Service: Orthopedics;  Laterality: Left;  . NISSEN FUNDOPLICATION  [id number]  . NM MYOVIEW LTD  03/09/2013   Lexiscan -->  EF 50%; NORMAL MYOCARDIAL PERFUSION STUDY - breast attenuation  . NM MYOVIEW LTD  03/10/2017   : Moderate size "stress-induced "perfusion defect at the apex as well as "ill-defined stress-induced perfusion defect in the lateral wall.  EF 55%.  INTERMEDIATE risk. -->  FALSE POSITIVE  . Right and left CARDIAC CATHETERIZATION  04/23/2013   Angiographic normal coronaries; LVEDP 20 mmHg, PCWP 12-14 mmHg, RAP 12 mmHg.; Fick CO/CI 4.9/2.2  . SHOULDER ARTHROSCOPY Left 03/14/2019   Procedure: LEFT SHOULDER ARTHROSCOPY, DEBRIDEMENT, AND DECOMPRESSION;  Surgeon: Newt Minion, MD;  Location: [facility];  Service: Orthopedics;  Laterality: Left;  . TOTAL KNEE ARTHROPLASTY Right 06/29/2017   Procedure: RIGHT TOTAL KNEE ARTHROPLASTY;  Surgeon: Newt Minion, MD;  Location: [facility];  Service: Orthopedics;  Laterality: Right;  . TOTAL KNEE ARTHROPLASTY Left 12/07/2017  . TOTAL KNEE ARTHROPLASTY Left 12/07/2017   Procedure: LEFT TOTAL KNEE ARTHROPLASTY;  Surgeon: Newt Minion, MD;  Location: [facility];  Service: Orthopedics;  Laterality: Left;  . TRACHEOSTOMY TUBE PLACEMENT N/A 09/20/2017   Procedure: AWAKE INTUBATION WITH ANESTHESIA WITH VIDEO ASSISTANCE;  Surgeon: Helayne Seminole, MD;  Location: [facility];  Service: ENT;  Laterality: N/A;  . TRANSTHORACIC ECHOCARDIOGRAM  08/2014   Normal LV size and function.  Mild LVH.  EF 55-60%.  Normal regional wall motion.  GR 1 DD.  Normal RV size and function .  [name] TUBAL LIGATION     with reversal in 1994  . VENTRAL HERNIA REPAIR     Social History   Occupational History  . Occupation: DISABLED  Tobacco Use  . Smoking status: Never Smoker  . Smokeless tobacco: Never Used  Substance and Sexual Activity  . Alcohol use: No    Alcohol/week: 0.0  standard drinks  . Drug use: No  . Sexual activity: Yes    Birth control/protection: Surgical

## 2019-06-07 ENCOUNTER — Other Ambulatory Visit: Payer: Self-pay | Admitting: Family Medicine

## 2019-06-07 ENCOUNTER — Telehealth: Payer: Self-pay | Admitting: Orthopedic Surgery

## 2019-06-07 ENCOUNTER — Other Ambulatory Visit: Payer: Self-pay | Admitting: Endocrinology

## 2019-06-07 ENCOUNTER — Other Ambulatory Visit: Payer: Self-pay | Admitting: Orthopedic Surgery

## 2019-06-07 DIAGNOSIS — Z794 Long term (current) use of insulin: Secondary | ICD-10-CM

## 2019-06-07 DIAGNOSIS — E1129 Type 2 diabetes mellitus with other diabetic kidney complication: Secondary | ICD-10-CM

## 2019-06-07 MED ORDER — DICLOFENAC SODIUM 1 % TD GEL: 2.0000 g | Freq: Four times a day (QID) | TRANSDERMAL | Status: AC | PRN

## 2019-06-07 MED ORDER — DICLOFENAC SODIUM 1 % TD GEL: 2.0000 g | Freq: Four times a day (QID) | TRANSDERMAL | Status: DC | PRN

## 2019-06-07 NOTE — Telephone Encounter (Signed)
rx written for gel

## 2019-06-07 NOTE — Telephone Encounter (Signed)
Pt would like rx for pennsaid

## 2019-06-07 NOTE — Telephone Encounter (Signed)
Patient's husband called requesting a RX for the Pen[name] [address] [facility] [address].  He said that medication worked really well for his wife.  CB#[phone].  Thank you.

## 2019-06-08 ENCOUNTER — Telehealth: Payer: Self-pay | Admitting: Orthopedic Surgery

## 2019-06-08 NOTE — Telephone Encounter (Signed)
Patient called to let us know that her pharmacy does not have the RX for the Voltaren Gell 1% that was sent in by Dr. Sharol Given.  She is leaving to go out of town today at 3 and wants to know if someone can [name] [address].  CB#[phone]. Thank you.

## 2019-06-08 NOTE — Telephone Encounter (Signed)
IC advised was OTC

## 2019-06-13 ENCOUNTER — Other Ambulatory Visit: Payer: Self-pay | Admitting: Endocrinology

## 2019-06-13 ENCOUNTER — Other Ambulatory Visit: Payer: Self-pay

## 2019-06-13 MED ORDER — LINACLOTIDE 290 MCG PO CAPS: 290.0000 ug | ORAL_CAPSULE | Freq: Every day | ORAL | 11 refills | Status: DC

## 2019-06-13 NOTE — Telephone Encounter (Signed)
Please refill x 1 F/u is due  

## 2019-06-13 NOTE — Telephone Encounter (Signed)
LOV 02/12/19. Per your office note, pt was advised to f/u 2. No future appt noted. Please advise.

## 2019-06-15 ENCOUNTER — Telehealth: Payer: Self-pay | Admitting: *Deleted

## 2019-06-15 NOTE — Telephone Encounter (Signed)
"  This is [name].  My wife, Zalaya Guster, is a patient of Dr. Amalia Hailey.  They were talking about re-doing her left foot for the Hammer Toe surgery.  We're coming back from vacation and wanted to go ahead and see about scheduling that.  Please give her a call at [phone]."

## 2019-06-19 ENCOUNTER — Telehealth: Payer: Self-pay | Admitting: Orthopedic Surgery

## 2019-06-19 NOTE — Telephone Encounter (Signed)
Printed and will fax

## 2019-06-19 NOTE — Telephone Encounter (Signed)
Beverlee Nims from [employer] called. Patient is there. They need her OP note. Fax number is [phone].

## 2019-06-20 ENCOUNTER — Ambulatory Visit (INDEPENDENT_AMBULATORY_CARE_PROVIDER_SITE_OTHER): Payer: Self-pay | Admitting: Podiatry

## 2019-06-20 ENCOUNTER — Other Ambulatory Visit: Payer: Self-pay

## 2019-06-20 ENCOUNTER — Encounter: Payer: Self-pay | Admitting: Podiatry

## 2019-06-20 DIAGNOSIS — M2041 Other hammer toe(s) (acquired), right foot: Secondary | ICD-10-CM

## 2019-06-20 DIAGNOSIS — M2042 Other hammer toe(s) (acquired), left foot: Secondary | ICD-10-CM

## 2019-06-22 ENCOUNTER — Other Ambulatory Visit: Payer: Self-pay | Admitting: Cardiology

## 2019-06-22 ENCOUNTER — Other Ambulatory Visit: Payer: Self-pay | Admitting: Cardiovascular Disease

## 2019-06-23 NOTE — Progress Notes (Signed)
   Subjective:  Patient presents today status post hammertoe repair digits 2-5 right. DOS: 04/12/2019. She reports some continued pain. She has been wearing good shoe gear as instructed. Being on her feet for too long increases the pain. Patient is here for further evaluation and treatment.    Past Medical History:  Diagnosis Date  . Abnormal SPEP 04/17/2014  . Acute left ankle pain 01/26/2017  . ANEMIA-UNSPECIFIED 09/18/2009  . Anxiety   . Arthritis   . Chronic diastolic heart failure, NYHA class 2 (HCC)    Normal LVEDP by May 2018  . COPD (chronic obstructive pulmonary disease) ([place])   . Depression   . DIABETES MELLITUS, TYPE II 08/21/2006  . Diabetic osteomyelitis ([place]) 05/29/2015  . Fracture of 5th metatarsal    non union  . GERD [id number]   Had fundoplication  . GOUT 08/20/2010  . Hammer toe of right foot    2-5th toes  . Hx of umbilical hernia repair   . HYPERLIPIDEMIA 08/21/2006  . HYPERTENSION 08/21/2006  . Infection of wound due to methicillin resistant Staphylococcus aureus (MRSA)   . Internal hemorrhoids   . Multiple allergies 10/14/2016  . OBESITY 06/04/2009  . Onychomycosis 10/27/2015  . Osteomyelitis of left foot ([place]) 05/29/2015  . Pulmonary sarcoidosis ([place])    Followed locally by pulmonology, but also by Dr. Casper Harrison at [facility]  . Right knee pain 01/26/2017  . Vocal cord dysfunction   . Wears partial dentures       Objective/Physical Exam Neurovascular status intact.  Skin incisions appear to be well coapted. No sign of infectious process noted. No dehiscence. No active bleeding noted. Moderate edema noted to the surgical extremity. Hammertoe contracture deformity noted to digits 2-4 of the left foot.   Assessment: 1. s/p hammertoe repair digits 2-5 right. DOS: 04/12/2019 2. Hammertoe contracture digits 2-4 left - recurrent    Plan of Care:  1. Patient was evaluated.  2. Authorization for surgery initiated last visit.  3. Surgery will be at [facility].  4. Return to clinic one week post op.    Edrick Kins, DPM [facility]  Dr. Edrick Kins, [address]                                        [place], [place] [address]                Office [phone]  Fax [phone]

## 2019-06-25 ENCOUNTER — Ambulatory Visit: Payer: Medicare Other | Admitting: Cardiology

## 2019-06-25 ENCOUNTER — Ambulatory Visit: Payer: 59 | Admitting: Orthopedic Surgery

## 2019-06-25 ENCOUNTER — Other Ambulatory Visit: Payer: Self-pay

## 2019-06-25 MED ORDER — DEXILANT 60 MG PO CPDR: 60.0000 mg | DELAYED_RELEASE_CAPSULE | Freq: Every day | ORAL | 5 refills | Status: DC

## 2019-06-27 ENCOUNTER — Encounter: Payer: Self-pay | Admitting: Orthopedic Surgery

## 2019-06-27 ENCOUNTER — Ambulatory Visit (INDEPENDENT_AMBULATORY_CARE_PROVIDER_SITE_OTHER): Payer: 59 | Admitting: Orthopedic Surgery

## 2019-06-27 ENCOUNTER — Other Ambulatory Visit: Payer: Self-pay | Admitting: Cardiology

## 2019-06-27 DIAGNOSIS — M7542 Impingement syndrome of left shoulder: Secondary | ICD-10-CM

## 2019-06-27 NOTE — Progress Notes (Signed)
Office Visit Note   Patient: [name]           Date of Birth: 1961-01-23           MRN: [id number] Visit Date: 06/27/2019              Requested by: [name], MD [place],  [place] [id number] PCP: [name], MD  No chief complaint on file.     HPI: Patient is a 58 year old woman 4 months status post left shoulder arthroscopy who still has persistent pain.  She is not using any anti-inflammatories she denies any new injuries.  She complains of decreased range of motion of her shoulder.  She is currently in physical therapy.   Assessment & Plan: Visit Diagnoses:  1. Impingement syndrome of left shoulder     Plan: Patient will continue with physical therapy follow-up in 4 weeks.  Discussed the pros and cons of total shoulder arthroplasty.  Follow-Up Instructions: Return in about 4 weeks (around 07/25/2019).   Ortho Exam  Patient is alert, oriented, no adenopathy, well-dressed, normal affect, normal respiratory effort. Examination patient has decreased range of motion of her shoulder there is no redness no cellulitis no signs of infection no radicular symptoms.  Imaging: No results found. No images are attached to the encounter.  Labs: Lab Results  Component Value Date   HGBA1C 8.2 (H) 03/12/2019   HGBA1C 8.2 (A) 08/25/2018   HGBA1C 7.6 (A) 04/11/2018   ESRSEDRATE 28 06/16/2017   ESRSEDRATE 5 01/26/2017   ESRSEDRATE 27 10/14/2016   CRP 27.1 (H) 06/16/2017   CRP 17.3 (H) 01/26/2017   CRP 24.0 (H) 10/14/2016   LABURIC 5.1 01/26/2017   REPTSTATUS 09/22/2017 FINAL 09/20/2017   GRAMSTAIN  08/14/2017    MODERATE WBC PRESENT, PREDOMINANTLY PMN MODERATE BUDDING YEAST SEEN    CULT NO GROUP A STREP (S.PYOGENES) ISOLATED 09/20/2017   LABORGA PSEUDOMONAS FLUORESCENS 08/14/2017     Lab Results  Component Value Date   ALBUMIN 4.1 12/04/2018   ALBUMIN 4.4 04/05/2018   ALBUMIN 4.2 02/08/2018   LABURIC 5.1 01/26/2017    Lab  Results  Component Value Date   MG 1.7 09/23/2017   MG 1.9 09/22/2017   MG 1.8 09/21/2017   No results found for: VD25OH  No results found for: PREALBUMIN CBC EXTENDED Latest Ref Rng & Units 04/10/2019 03/12/2019 12/13/2018  WBC 4.0 - 10.5 K/uL 9.7 9.4 17.5(H)  RBC 3.87 - 5.11 MIL/uL 3.76(L) 3.68(L) 3.95  HGB 12.0 - 15.0 g/dL 10.2(L) 10.0(L) 10.5(L)  HCT 36.0 - 46.0 % 33.3(L) 33.1(L) 32.6(L)  PLT 150 - 400 K/uL 341 311 422.0(H)  NEUTROABS 1.4 - 7.7 K/uL - - -  LYMPHSABS 0.7 - 4.0 K/uL - - -     There is no height or weight on file to calculate BMI.  Orders:  No orders of the defined types were placed in this encounter.  No orders of the defined types were placed in this encounter.    Procedures: No procedures performed  Clinical Data: No additional findings.  ROS:  All other systems negative, except as noted in the HPI. Review of Systems  Objective: Vital Signs: There were no vitals taken for this visit.  Specialty Comments:  No specialty comments available.  PMFS History: Patient Active Problem List   Diagnosis Date Noted  . Nontraumatic complete tear of left rotator cuff   . Impingement syndrome of left shoulder   . Diabetic neuropathy ([place])  02/20/2019  . Severe persistent asthma 01/04/2019  . Subcortical microvascular ischemic occlusive disease 07/12/2018  . Rapid palpitations 07/12/2018  . Impingement of left ankle joint 06/26/2018  . Constipation 04/21/2018  . Anxiety 04/05/2018  . Total knee replacement status, left 12/07/2017  . Dysphagia 09/20/2017  . Epiglottitis   . Plantar fasciitis, right 07/13/2017  . S/P total knee arthroplasty, right 06/29/2017  . Osteoarthrosis, localized, primary, knee, right   . Sprain of calcaneofibular ligament of right ankle 06/06/2017  . Osteoarthritis of right knee 01/26/2017  . Onychomycosis 10/27/2015  . CKD (chronic kidney disease) stage 3, GFR 30-59 ml/min (HCC) 04/27/2015  . MRSA (methicillin resistant staph  aureus) culture positive 03/27/2015  . History of osteomyelitis 03/27/2015  . Fatty liver 09/30/2014  . Hot flashes 07/15/2014  . Abnormal SPEP 04/17/2014  . Fracture of left leg 04/17/2014  . Cushingoid side effect of steroids ([place]) 04/17/2014  . Internal hemorrhoids   . Major depression in full remission ([place])   . Preoperative clearance 03/25/2014  . Obstructive chronic bronchitis without exacerbation ([place]) 09/18/2013  . Chest pain 04/11/2013  . Hypertensive heart disease with chronic diastolic congestive heart failure ([place])   . Solitary pulmonary nodule, on CT 02/2013 - stable over 2 years in 2015 02/20/2013  . Gout 08/20/2010  . Anemia 09/18/2009  . Morbid obesity ([place]) 06/04/2009  . Sleep apnea 04/21/2009  . Sarcoidosis of lung ([place]) 04/10/2007  . Hyperlipidemia associated with type 2 diabetes mellitus ([place]) 08/21/2006  . Hypertension associated with diabetes ([place]) 08/21/2006  . GERD 08/21/2006  . Type II diabetes mellitus with renal manifestations ([place]) 08/21/2006   Past Medical History:  Diagnosis Date  . Abnormal SPEP 04/17/2014  . Acute left ankle pain 01/26/2017  . ANEMIA-UNSPECIFIED 09/18/2009  . Anxiety   . Arthritis   . Chronic diastolic heart failure, NYHA class 2 (HCC)    Normal LVEDP by May 2018  . COPD (chronic obstructive pulmonary disease) ([place])   . Depression   . DIABETES MELLITUS, TYPE II 08/21/2006  . Diabetic osteomyelitis ([place]) 05/29/2015  . Fracture of 5th metatarsal    non union  . GERD [id number]   Had fundoplication  . GOUT 08/20/2010  . Hammer toe of right foot    2-5th toes  . Hx of umbilical hernia repair   . HYPERLIPIDEMIA 08/21/2006  . HYPERTENSION 08/21/2006  . Infection of wound due to methicillin resistant Staphylococcus aureus (MRSA)   . Internal hemorrhoids   . Multiple allergies 10/14/2016  . OBESITY 06/04/2009  . Onychomycosis 10/27/2015  . Osteomyelitis of left foot ([place]) 05/29/2015  . Pulmonary sarcoidosis ([place])    Followed locally  by pulmonology, but also by Dr. Casper Harrison at [facility]  . Right knee pain 01/26/2017  . Vocal cord dysfunction   . Wears partial dentures     Family History  Problem Relation Age of Onset  . Diabetes Father   . Heart attack Father 72  . Coronary artery disease Father   . Heart failure Father   . Throat cancer Father   . COPD Mother   . Emphysema Mother   . Asthma Mother   . Heart failure Mother   . Breast cancer Mother   . Heart attack Maternal Grandfather   . Sarcoidosis Maternal Uncle   . Lung cancer Brother   . Diabetes Brother   . Colon cancer Neg Hx   . Rectal cancer Neg Hx     Past Surgical History:  Procedure Laterality Date  .  ABDOMINAL HYSTERECTOMY    . APPENDECTOMY    . BLADDER SUSPENSION  11/11/2011   Procedure: TRANSVAGINAL TAPE (TVT) PROCEDURE;  Surgeon: Olga Millers, MD;  Location: [facility];  Service: Gynecology;  Laterality: N/A;  . CAROTIDS  02/18/11   CAROTID DUPLEX; VERTEBRALS ARE PATENT WITH ANTEGRADE FLOW. ICA/CCA RATIO 1.61 ON RIGHT AND 0.75 ON LEFT  . CHOLECYSTECTOMY  1984  . CYSTOSCOPY  11/11/2011   Procedure: CYSTOSCOPY;  Surgeon: Olga Millers, MD;  Location: [facility];  Service: Gynecology;  Laterality: N/A;  . EXTUBATION (ENDOTRACHEAL) IN OR N/A 09/23/2017   Procedure: EXTUBATION (ENDOTRACHEAL) IN OR;  Surgeon: Helayne Seminole, MD;  Location: [facility];  Service: ENT;  Laterality: N/A;  . FIBEROPTIC LARYNGOSCOPY AND TRACHEOSCOPY N/A 09/23/2017   Procedure: FLEXIBLE FIBEROPTIC LARYNGOSCOPY;  Surgeon: Helayne Seminole, MD;  Location: [facility];  Service: ENT;  Laterality: N/A;  . FRACTURE SURGERY     foot  . HALLUX FUSION Left 10/02/2018   Procedure: HALLUX ARTHRODESIS;  Surgeon: Edrick Kins, DPM;  Location: [facility];  Service: Podiatry;  Laterality: Left;  . HAMMER TOE SURGERY Left 10/02/2018   Procedure: HAMMER TOE CORRECTION 2ND 3RD 4RD FIFTH TOE;  Surgeon: Edrick Kins, DPM;  Location: [facility];  Service: Podiatry;  Laterality: Left;   . HAMMER TOE SURGERY Right 04/12/2019   Procedure: HAMMER TOE CORRECTION, 2ND, 3RD, 4TH AND 5TH TOES OF RIGHT FOOT;  Surgeon: Edrick Kins, DPM;  Location: [facility];  Service: Podiatry;  Laterality: Right;  . HERNIA REPAIR    . I&D EXTREMITY Left 06/27/2015   Procedure: Partial Excision Left Calcaneus, Place Antibiotic Beads, and Wound VAC;  Surgeon: Newt Minion, MD;  Location: [facility];  Service: Orthopedics;  Laterality: Left;  . KNEE ARTHROSCOPY     right  . LEFT AND RIGHT HEART CATHETERIZATION WITH CORONARY ANGIOGRAM N/A 04/23/2013   Procedure: LEFT AND RIGHT HEART CATHETERIZATION WITH CORONARY ANGIOGRAM;  Surgeon: Leonie Man, MD;  Location: [facility];  Service: Cardiovascular;  Laterality: N/A;  . LEFT HEART CATH AND CORONARY ANGIOGRAPHY N/A 03/11/2017   Procedure: Left Heart Cath and Coronary Angiography;  Surgeon: Sherren Mocha, MD;  Location: [facility]; angiographically minimal CAD in the LAD otherwise normal.;  Normal LVEDP.  FALSE POSITIVE MYOVIEW  . LEFT HEART CATH AND CORONARY ANGIOGRAPHY  07/20/2010   LVEF 50-55% WITH VERY MILD GLOBAL HYPOKINESIA; ESSENTIALLY NORMAL CORONARY ARTERIES; NORMAL LV FUNCTION  . METATARSAL OSTEOTOMY WITH OPEN REDUCTION INTERNAL FIXATION (ORIF) METATARSAL WITH FUSION Left 04/09/2014   Procedure: LEFT FOOT FRACTURE OPEN TREATMENT METATARSAL INCLUDES INTERNAL FIXATION EACH;  Surgeon: Lorn Junes, MD;  Location: [facility];  Service: Orthopedics;  Laterality: Left;  . NISSEN FUNDOPLICATION  [id number]  . NM MYOVIEW LTD  03/09/2013   Lexiscan --> EF 50%; NORMAL MYOCARDIAL PERFUSION STUDY - breast attenuation  . NM MYOVIEW LTD  03/10/2017   : Moderate size "stress-induced "perfusion defect at the apex as well as "ill-defined stress-induced perfusion defect in the lateral wall.  EF 55%.  INTERMEDIATE risk. -->  FALSE POSITIVE  . Right and left CARDIAC CATHETERIZATION  04/23/2013   Angiographic normal coronaries; LVEDP 20 mmHg, PCWP 12-14  mmHg, RAP 12 mmHg.; [place]ick CO/CI 4.9/2.2  . SHOULDER ARTHROSCOPY Left 03/14/2019   Procedure: LEFT SHOULDER ARTHROSCOPY, DEBRIDEMENT, AND DECOMPRESSION;  Surgeon: Newt Minion, MD;  Location: [facility];  Service: Orthopedics;  Laterality: Left;  . TOTAL KNEE ARTHROPLASTY Right 06/29/2017  Procedure: RIGHT TOTAL KNEE ARTHROPLASTY;  Surgeon: Newt Minion, MD;  Location: [facility];  Service: Orthopedics;  Laterality: Right;  . TOTAL KNEE ARTHROPLASTY Left 12/07/2017  . TOTAL KNEE ARTHROPLASTY Left 12/07/2017   Procedure: LEFT TOTAL KNEE ARTHROPLASTY;  Surgeon: Newt Minion, MD;  Location: [facility];  Service: Orthopedics;  Laterality: Left;  . TRACHEOSTOMY TUBE PLACEMENT N/A 09/20/2017   Procedure: AWAKE INTUBATION WITH ANESTHESIA WITH VIDEO ASSISTANCE;  Surgeon: Helayne Seminole, MD;  Location: [facility];  Service: ENT;  Laterality: N/A;  . TRANSTHORACIC ECHOCARDIOGRAM  08/2014   Normal LV size and function.  Mild LVH.  EF 55-60%.  Normal regional wall motion.  GR 1 DD.  Normal RV size and function .  [name] TUBAL LIGATION     with reversal in 1994  . VENTRAL HERNIA REPAIR     Social History   Occupational History  . Occupation: DISABLED  Tobacco Use  . Smoking status: Never Smoker  . Smokeless tobacco: Never Used  Substance and Sexual Activity  . Alcohol use: No    Alcohol/week: 0.0 standard drinks  . Drug use: No  . Sexual activity: Yes    Birth control/protection: Surgical

## 2019-06-30 ENCOUNTER — Other Ambulatory Visit: Payer: Self-pay | Admitting: Endocrinology

## 2019-06-30 DIAGNOSIS — Z794 Long term (current) use of insulin: Secondary | ICD-10-CM

## 2019-06-30 DIAGNOSIS — E1129 Type 2 diabetes mellitus with other diabetic kidney complication: Secondary | ICD-10-CM

## 2019-06-30 NOTE — Telephone Encounter (Signed)
Please refill x 1 F/u is due  

## 2019-07-03 ENCOUNTER — Encounter: Payer: Self-pay | Admitting: Family Medicine

## 2019-07-04 ENCOUNTER — Ambulatory Visit (INDEPENDENT_AMBULATORY_CARE_PROVIDER_SITE_OTHER): Payer: 59 | Admitting: Family Medicine

## 2019-07-04 ENCOUNTER — Other Ambulatory Visit: Payer: Self-pay

## 2019-07-04 ENCOUNTER — Encounter: Payer: Self-pay | Admitting: Family Medicine

## 2019-07-04 VITALS — BP 146/60 | HR 85 | Temp 98.9°F | Ht 66.0 in | Wt 254.2 lb

## 2019-07-04 DIAGNOSIS — Z79899 Other long term (current) drug therapy: Secondary | ICD-10-CM | POA: Diagnosis not present

## 2019-07-04 DIAGNOSIS — I10 Essential (primary) hypertension: Secondary | ICD-10-CM

## 2019-07-04 DIAGNOSIS — Z0000 Encounter for general adult medical examination without abnormal findings: Secondary | ICD-10-CM

## 2019-07-04 DIAGNOSIS — Z780 Asymptomatic menopausal state: Secondary | ICD-10-CM

## 2019-07-04 DIAGNOSIS — Z794 Long term (current) use of insulin: Secondary | ICD-10-CM | POA: Diagnosis not present

## 2019-07-04 DIAGNOSIS — N183 Chronic kidney disease, stage 3 unspecified: Secondary | ICD-10-CM

## 2019-07-04 DIAGNOSIS — M1A4720 Other secondary chronic gout, left ankle and foot, without tophus (tophi): Secondary | ICD-10-CM

## 2019-07-04 DIAGNOSIS — E1169 Type 2 diabetes mellitus with other specified complication: Secondary | ICD-10-CM

## 2019-07-04 DIAGNOSIS — I5032 Chronic diastolic (congestive) heart failure: Secondary | ICD-10-CM

## 2019-07-04 DIAGNOSIS — I110 Hypertensive heart disease with heart failure: Secondary | ICD-10-CM

## 2019-07-04 DIAGNOSIS — Z23 Encounter for immunization: Secondary | ICD-10-CM

## 2019-07-04 DIAGNOSIS — I152 Hypertension secondary to endocrine disorders: Secondary | ICD-10-CM

## 2019-07-04 DIAGNOSIS — E785 Hyperlipidemia, unspecified: Secondary | ICD-10-CM

## 2019-07-04 DIAGNOSIS — E1129 Type 2 diabetes mellitus with other diabetic kidney complication: Secondary | ICD-10-CM

## 2019-07-04 DIAGNOSIS — E1159 Type 2 diabetes mellitus with other circulatory complications: Secondary | ICD-10-CM

## 2019-07-04 DIAGNOSIS — F3342 Major depressive disorder, recurrent, in full remission: Secondary | ICD-10-CM

## 2019-07-04 DIAGNOSIS — E249 Cushing's syndrome, unspecified: Secondary | ICD-10-CM

## 2019-07-04 DIAGNOSIS — E242 Drug-induced Cushing's syndrome: Secondary | ICD-10-CM

## 2019-07-04 LAB — LIPID PANEL
Cholesterol: 160 mg/dL (ref 0–200)
HDL: 32.2 mg/dL — ABNORMAL LOW (ref >39.00)
NonHDL: 127.96
Total CHOL/HDL Ratio: 5
Triglycerides: 365 mg/dL — ABNORMAL HIGH (ref 0.0–149.0)
VLDL: 73 mg/dL — ABNORMAL HIGH (ref 0.0–40.0)

## 2019-07-04 LAB — LDL CHOLESTEROL, DIRECT: Direct LDL: 86 mg/dL

## 2019-07-04 LAB — COMPREHENSIVE METABOLIC PANEL
ALT: 22 U/L (ref 0–35)
AST: 24 U/L (ref 0–37)
Albumin: 4.2 g/dL (ref 3.5–5.2)
Alkaline Phosphatase: 85 U/L (ref 39–117)
BUN: 14 mg/dL (ref 6–23)
CO2: 29 mEq/L (ref 19–32)
Calcium: 9.9 mg/dL (ref 8.4–10.5)
Chloride: 96 mEq/L (ref 96–112)
Creatinine, Ser: 0.95 mg/dL (ref 0.40–1.20)
GFR: 60.02 mL/min (ref >60.00)
Glucose, Bld: 239 mg/dL — ABNORMAL HIGH (ref 70–99)
Potassium: 4.3 mEq/L (ref 3.5–5.1)
Sodium: 137 mEq/L (ref 135–145)
Total Bilirubin: 0.6 mg/dL (ref 0.2–1.2)
Total Protein: 6.4 g/dL (ref 6.0–8.3)

## 2019-07-04 LAB — URIC ACID: Uric Acid, Serum: 5.6 mg/dL (ref 2.4–7.0)

## 2019-07-04 LAB — HEMOGLOBIN A1C: Hgb A1c MFr Bld: 9.9 % — ABNORMAL HIGH (ref 4.6–6.5)

## 2019-07-04 LAB — CBC
HCT: 33.7 % — ABNORMAL LOW (ref 36.0–46.0)
Hemoglobin: 10.6 g/dL — ABNORMAL LOW (ref 12.0–15.0)
MCHC: 31.5 g/dL (ref 30.0–36.0)
MCV: 84.1 fl (ref 78.0–100.0)
Platelets: 366 10*3/uL (ref 150.0–400.0)
RBC: 4.01 Mil/uL (ref 3.87–5.11)
RDW: 15.6 % — ABNORMAL HIGH (ref 11.5–15.5)
WBC: 11.4 10*3/uL — ABNORMAL HIGH (ref 4.0–10.5)

## 2019-07-04 LAB — VITAMIN B12: Vitamin B-12: 317 pg/mL (ref 211–911)

## 2019-07-04 NOTE — Telephone Encounter (Signed)
Patient came in for a consult.  She has requested 08/09/2019 for her surgery date.

## 2019-07-04 NOTE — Progress Notes (Signed)
Phone: [phone]   Subjective:  Patient presents today for their annual physical. Chief complaint-noted.   See problem oriented charting- ROS- full  review of systems was completed and negative except for: Sinus Pressure  The following were reviewed and entered/updated in epic: Past Medical History:  Diagnosis Date  . Abnormal SPEP 04/17/2014  . Acute left ankle pain 01/26/2017  . ANEMIA-UNSPECIFIED 09/18/2009  . Anxiety   . Arthritis   . Chronic diastolic heart failure, NYHA class 2 (HCC)    Normal LVEDP by May 2018  . COPD (chronic obstructive pulmonary disease) ([place])   . Depression   . DIABETES MELLITUS, TYPE II 08/21/2006  . Diabetic osteomyelitis ([place]) 05/29/2015  . Fracture of 5th metatarsal    non union  . GERD [id number]   Had fundoplication  . GOUT 08/20/2010  . Hammer toe of right foot    2-5th toes  . Hx of umbilical hernia repair   . HYPERLIPIDEMIA 08/21/2006  . HYPERTENSION 08/21/2006  . Infection of wound due to methicillin resistant Staphylococcus aureus (MRSA)   . Internal hemorrhoids   . Multiple allergies 10/14/2016  . OBESITY 06/04/2009  . Onychomycosis 10/27/2015  . Osteomyelitis of left foot ([place]) 05/29/2015  . Pulmonary sarcoidosis ([place])    Followed locally by pulmonology, but also by Dr. Casper Harrison at [facility]  . Right knee pain 01/26/2017  . Vocal cord dysfunction   . Wears partial dentures    Patient Active Problem List   Diagnosis Date Noted  . Constipation 04/21/2018    Priority: High  . Obstructive chronic bronchitis without exacerbation ([place]) 09/18/2013    Priority: High  . Chest pain 04/11/2013    Priority: High  . Hypertensive heart disease with chronic diastolic congestive heart failure ([place])     Priority: High  . Sarcoidosis of lung ([place]) 04/10/2007    Priority: High  . Type II diabetes mellitus with renal manifestations ([place]) 08/21/2006    Priority: High  . Epiglottitis     Priority: Medium  . Osteoarthritis of right  knee 01/26/2017    Priority: Medium  . CKD (chronic kidney disease) stage 3, GFR 30-59 ml/min (HCC) 04/27/2015    Priority: Medium  . History of osteomyelitis 03/27/2015    Priority: Medium  . Fatty liver 09/30/2014    Priority: Medium  . Major depression in full remission ([place])     Priority: Medium  . Gout 08/20/2010    Priority: Medium  . Anemia 09/18/2009    Priority: Medium  . Sleep apnea 04/21/2009    Priority: Medium  . Hyperlipidemia associated with type 2 diabetes mellitus ([place]) 08/21/2006    Priority: Medium  . Hypertension associated with diabetes ([place]) 08/21/2006    Priority: Medium  . Total knee replacement status, left 12/07/2017    Priority: Low  . Dysphagia 09/20/2017    Priority: Low  . Plantar fasciitis, right 07/13/2017    Priority: Low  . S/P total knee arthroplasty, right 06/29/2017    Priority: Low  . Osteoarthrosis, localized, primary, knee, right     Priority: Low  . Sprain of calcaneofibular ligament of right ankle 06/06/2017    Priority: Low  . Onychomycosis 10/27/2015    Priority: Low  . MRSA (methicillin resistant staph aureus) culture positive 03/27/2015    Priority: Low  . Hot flashes 07/15/2014    Priority: Low  . Abnormal SPEP 04/17/2014    Priority: Low  . Fracture of left leg 04/17/2014    Priority: Low  .  Cushingoid side effect of steroids ([place]) 04/17/2014    Priority: Low  . Internal hemorrhoids     Priority: Low  . Preoperative clearance 03/25/2014    Priority: Low  . Solitary pulmonary nodule, on CT 02/2013 - stable over 2 years in 2015 02/20/2013    Priority: Low  . Morbid obesity ([place]) 06/04/2009    Priority: Low  . GERD 08/21/2006    Priority: Low  . Nontraumatic complete tear of left rotator cuff   . Impingement syndrome of left shoulder   . Diabetic neuropathy ([place]) 02/20/2019  . Severe persistent asthma 01/04/2019  . Subcortical microvascular ischemic occlusive disease 07/12/2018  . Rapid palpitations 07/12/2018  .  Impingement of left ankle joint 06/26/2018  . Anxiety 04/05/2018   Past Surgical History:  Procedure Laterality Date  . ABDOMINAL HYSTERECTOMY    . APPENDECTOMY    . BLADDER SUSPENSION  11/11/2011   Procedure: TRANSVAGINAL TAPE (TVT) PROCEDURE;  Surgeon: Olga Millers, MD;  Location: [facility];  Service: Gynecology;  Laterality: N/A;  . CAROTIDS  02/18/11   CAROTID DUPLEX; VERTEBRALS ARE PATENT WITH ANTEGRADE FLOW. ICA/CCA RATIO 1.61 ON RIGHT AND 0.75 ON LEFT  . CHOLECYSTECTOMY  1984  . CYSTOSCOPY  11/11/2011   Procedure: CYSTOSCOPY;  Surgeon: Olga Millers, MD;  Location: [facility];  Service: Gynecology;  Laterality: N/A;  . EXTUBATION (ENDOTRACHEAL) IN OR N/A 09/23/2017   Procedure: EXTUBATION (ENDOTRACHEAL) IN OR;  Surgeon: Helayne Seminole, MD;  Location: [facility];  Service: ENT;  Laterality: N/A;  . FIBEROPTIC LARYNGOSCOPY AND TRACHEOSCOPY N/A 09/23/2017   Procedure: FLEXIBLE FIBEROPTIC LARYNGOSCOPY;  Surgeon: Helayne Seminole, MD;  Location: [place];  Service: ENT;  Laterality: N/A;  . FRACTURE SURGERY     foot  . HALLUX FUSION Left 10/02/2018   Procedure: HALLUX ARTHRODESIS;  Surgeon: Edrick Kins, DPM;  Location: [facility];  Service: Podiatry;  Laterality: Left;  . HAMMER TOE SURGERY Left 10/02/2018   Procedure: HAMMER TOE CORRECTION 2ND 3RD 4RD FIFTH TOE;  Surgeon: Edrick Kins, DPM;  Location: [facility];  Service: Podiatry;  Laterality: Left;  . HAMMER TOE SURGERY Right 04/12/2019   Procedure: HAMMER TOE CORRECTION, 2ND, 3RD, 4TH AND 5TH TOES OF RIGHT FOOT;  Surgeon: Edrick Kins, DPM;  Location: [place];  Service: Podiatry;  Laterality: Right;  . HERNIA REPAIR    . I&D EXTREMITY Left 06/27/2015   Procedure: Partial Excision Left Calcaneus, Place Antibiotic Beads, and Wound VAC;  Surgeon: Newt Minion, MD;  Location: [place];  Service: Orthopedics;  Laterality: Left;  . KNEE ARTHROSCOPY     right  . LEFT AND RIGHT HEART CATHETERIZATION WITH CORONARY ANGIOGRAM N/A 04/23/2013    Procedure: LEFT AND RIGHT HEART CATHETERIZATION WITH CORONARY ANGIOGRAM;  Surgeon: Leonie Man, MD;  Location: [facility];  Service: Cardiovascular;  Laterality: N/A;  . LEFT HEART CATH AND CORONARY ANGIOGRAPHY N/A 03/11/2017   Procedure: Left Heart Cath and Coronary Angiography;  Surgeon: Sherren Mocha, MD;  Location: [facility]; angiographically minimal CAD in the LAD otherwise normal.;  Normal LVEDP.  FALSE POSITIVE MYOVIEW  . LEFT HEART CATH AND CORONARY ANGIOGRAPHY  07/20/2010   LVEF 50-55% WITH VERY MILD GLOBAL HYPOKINESIA; ESSENTIALLY NORMAL CORONARY ARTERIES; NORMAL LV FUNCTION  . METATARSAL OSTEOTOMY WITH OPEN REDUCTION INTERNAL FIXATION (ORIF) METATARSAL WITH FUSION Left 04/09/2014   Procedure: LEFT FOOT FRACTURE OPEN TREATMENT METATARSAL INCLUDES INTERNAL FIXATION EACH;  Surgeon: Lorn Junes, MD;  Location: [facility];  Service: Orthopedics;  Laterality: Left;  . NISSEN FUNDOPLICATION  [id number]  . NM MYOVIEW LTD  03/09/2013   Lexiscan --> EF 50%; NORMAL MYOCARDIAL PERFUSION STUDY - breast attenuation  . NM MYOVIEW LTD  03/10/2017   : Moderate size "stress-induced "perfusion defect at the apex as well as "ill-defined stress-induced perfusion defect in the lateral wall.  EF 55%.  INTERMEDIATE risk. -->  FALSE POSITIVE  . Right and left CARDIAC CATHETERIZATION  04/23/2013   Angiographic normal coronaries; LVEDP 20 mmHg, PCWP 12-14 mmHg, RAP 12 mmHg.; Fick CO/CI 4.9/2.2  . SHOULDER ARTHROSCOPY Left 03/14/2019   Procedure: LEFT SHOULDER ARTHROSCOPY, DEBRIDEMENT, AND DECOMPRESSION;  Surgeon: Newt Minion, MD;  Location: [facility];  Service: Orthopedics;  Laterality: Left;  . TOTAL KNEE ARTHROPLASTY Right 06/29/2017   Procedure: RIGHT TOTAL KNEE ARTHROPLASTY;  Surgeon: Newt Minion, MD;  Location: [place];  Service: Orthopedics;  Laterality: Right;  . TOTAL KNEE ARTHROPLASTY Left 12/07/2017  . TOTAL KNEE ARTHROPLASTY Left 12/07/2017   Procedure: LEFT TOTAL KNEE  ARTHROPLASTY;  Surgeon: Newt Minion, MD;  Location: [facility];  Service: Orthopedics;  Laterality: Left;  . TRACHEOSTOMY TUBE PLACEMENT N/A 09/20/2017   Procedure: AWAKE INTUBATION WITH ANESTHESIA WITH VIDEO ASSISTANCE;  Surgeon: Helayne Seminole, MD;  Location: [facility];  Service: ENT;  Laterality: N/A;  . TRANSTHORACIC ECHOCARDIOGRAM  08/2014   Normal LV size and function.  Mild LVH.  EF 55-60%.  Normal regional wall motion.  GR 1 DD.  Normal RV size and function .  [name] TUBAL LIGATION     with reversal in 1994  . VENTRAL HERNIA REPAIR      Family History  Problem Relation Age of Onset  . Diabetes Father   . Heart attack Father 45  . Coronary artery disease Father   . Heart failure Father   . Throat cancer Father   . COPD Mother   . Emphysema Mother   . Asthma Mother   . Heart failure Mother   . Breast cancer Mother   . Heart attack Maternal Grandfather   . Sarcoidosis Maternal Uncle   . Lung cancer Brother   . Diabetes Brother   . Colon cancer Neg Hx   . Rectal cancer Neg Hx     Medications- reviewed and updated Current Outpatient Medications  Medication Sig Dispense Refill  . albuterol (PROVENTIL HFA;VENTOLIN HFA) 108 (90 Base) MCG/ACT inhaler Inhale 1-2 puffs into the lungs every 6 (six) hours as needed for wheezing or shortness of breath. 8 g 5  . allopurinol (ZYLOPRIM) 100 MG tablet Take 100 mg by mouth every evening.     [name] aspirin EC 81 MG tablet Take 81 mg by mouth daily.     [name] atorvastatin (LIPITOR) 40 MG tablet TAKE 1 TAB BY MOUTH ONCE DAILY (Patient taking differently: Take 40 mg by mouth every evening. ) 90 tablet 3  . BREO ELLIPTA 200-25 MCG/INH AEPB TAKE 1 PUFF BY MOUTH EVERY DAY 60 each 5  . buPROPion (WELLBUTRIN XL) 300 MG 24 hr tablet TAKE 1 TABLET BY MOUTH EVERY DAY (Patient taking differently: Take 300 mg by mouth daily. ) 90 tablet 3  . carvedilol (COREG) 25 MG tablet TAKE 1 TABLET (25 MG TOTAL) BY MOUTH 2 (TWO) TIMES DAILY WITH A MEAL. 180 tablet 3  .  cholecalciferol (VITAMIN D) 25 MCG (1000 UT) tablet Take 1,000 Units by mouth 2 (two) times a day.    [name] dexlansoprazole (DEXILANT) 60  MG capsule Take 1 capsule (60 mg total) by mouth daily. 30 capsule 5  . diclofenac sodium (VOLTAREN) 1 % GEL Apply 2 g topically 4 (four) times daily as needed. 350 g g  . diltiazem (CARDIZEM CD) 120 MG 24 hr capsule Take 1 capsule (120 mg total) by mouth daily. 30 capsule 2  . famotidine (PEPCID) 20 MG tablet Take 1 tablet (20 mg total) by mouth at bedtime. 30 tablet 3  . fenofibrate 160 MG tablet TAKE 1 TABLET BY MOUTH EVERY DAY 90 tablet 0  . fluticasone (FLONASE) 50 MCG/ACT nasal spray PLACE 2 SPRAYS INTO BOTH NOSTRILS DAILY AS NEEDED FOR ALLERGIES 48 mL 1  . furosemide (LASIX) 40 MG tablet TAKE 1 TABLET BY MOUTH TWICE A DAY 180 tablet 3  . gabapentin (NEURONTIN) 100 MG capsule TAKE 1 CAPSULE BY MOUTH THREE TIMES A DAY (Patient taking differently: Take 100-200 mg by mouth See admin instructions. Take 1 capsule (100 mg) by mouth in the morning & 2 capsules (200 mg) by mouth at night.) 270 capsule 3  . glucose blood (ONETOUCH VERIO) test strip 1 each by Other route 2 (two) times daily. Use as instructed 200 each 3  . HYDROcodone-acetaminophen (NORCO/VICODIN) 5-325 MG tablet Take 1 tablet by mouth every 6 (six) hours as needed for moderate pain. 30 tablet 0  . Insulin Glargine (LANTUS SOLOSTAR) 100 UNIT/ML Solostar Pen Inject 50 Units into the skin every morning. MUST CALL TO SCHEDULE APPT 15 mL 0  . insulin lispro (HUMALOG KWIKPEN) 100 UNIT/ML KwikPen Inject 0.3 mLs (30 Units total) into the skin daily with supper. Take PRN, for a total of 25 units per day 5 pen 11  . Insulin Pen Needle 33G X 4 MM MISC 1 each by Does not apply route 4 (four) times daily. 200 each 11  . isosorbide mononitrate (IMDUR) 30 MG 24 hr tablet Take 1 tablet (30 mg total) by mouth at bedtime. 90 tablet 1  . KLOR-CON M20 20 MEQ tablet TAKE 1 & 1/2 TABLETS BY MOUTH TWICE A DAY (Patient taking  differently: Take 30 mEq by mouth 2 (two) times a day. ) 270 tablet 2  . lactulose (CHRONULAC) 10 GM/15ML solution Take 10 g by mouth daily.     [name] linaclotide (LINZESS) 290 MCG CAPS capsule Take 1 capsule (290 mcg total) by mouth daily before breakfast. 30 capsule 11  . LORazepam (ATIVAN) 0.5 MG tablet Take 0.5 mg by mouth every 12 (twelve) hours as needed for anxiety.    . metFORMIN (GLUCOPHAGE) 1000 MG tablet Take 1 tablet (1,000 mg total) by mouth 2 (two) times daily with a meal. MUST CALL TO SCHEDULE AN APPT. 60 tablet 0  . naproxen (NAPROSYN) 500 MG tablet Take 1 tablet (500 mg total) by mouth 2 (two) times daily with a meal. (Patient taking differently: Take 500 mg by mouth 2 (two) times daily as needed (pain). ) 60 tablet 1  . nitroGLYCERIN (NITROSTAT) 0.4 MG SL tablet Place 1 tablet (0.4 mg total) under the tongue every 5 (five) minutes as needed for chest pain. Reported on 01/05/2016 25 tablet 6  . ondansetron (ZOFRAN-ODT) 4 MG disintegrating tablet Take 1 tablet (4 mg total) by mouth every 8 (eight) hours as needed for nausea or vomiting. 50 tablet 2  . OneTouch Delica Lancets 99991111 MISC 1 each by Does not apply route 2 (two) times a day. E11.29 100 each 2  . oxyCODONE-acetaminophen (PERCOCET) 5-325 MG tablet Take 1 tablet by mouth every  6 (six) hours as needed for severe pain. 30 tablet 0  . PARoxetine Mesylate (BRISDELLE) 7.5 MG CAPS Take 7.5 mg by mouth daily.     . predniSONE (DELTASONE) 5 MG tablet Take 5 mg by mouth daily with breakfast.    . telmisartan (MICARDIS) 20 MG tablet TAKE 1 TABLET BY MOUTH EVERY DAY 90 tablet 1  . venlafaxine XR (EFFEXOR-XR) 75 MG 24 hr capsule Take 1 capsule (75 mg total) by mouth 2 (two) times daily. (Patient taking differently: Take 75 mg by mouth every evening. ) 180 capsule 3  . HYDROcodone-acetaminophen (NORCO/VICODIN) 5-325 MG tablet Take 1 tablet by mouth every 4 (four) hours as needed for moderate pain. 30 tablet 0  . predniSONE (DELTASONE) 50 MG  tablet Take one tablet by mouth once daily for 5 days. 5 tablet 0   No current facility-administered medications for this visit.     Allergies-reviewed and updated Allergies  Allergen Reactions  . Methotrexate Other (See Comments)    Peri-oral and buccal lesions  . Vancomycin Other (See Comments)    DOSE RELATED NEPHROTOXICITY  . Lisinopril Cough  . Chlorhexidine Itching  . Clindamycin/Lincomycin Nausea And Vomiting and Rash  . Doxycycline Rash  . Teflaro [Ceftaroline] Rash and Other (See Comments)    Tolerates ceftriaxone     Social History   Social History Narrative   Married 1994. 2 sons who both live close and 1 grandson.       Disability due to sarcoidosis. 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 Oropharynx normal. Nares mild yellow discharge- minimal erythema Neck: no thyromegaly CV: RRR no murmurs rubs or gallops Lungs: CTAB no crackles, wheeze, rhonchi Abdomen: soft/nontender/nondistended/normal bowel sounds. No rebound or guarding.  Ext: no edema Skin: warm, dry Neuro: grossly normal, moves all extremities, PERRLA   Assessment and Plan   58 y.o. female presenting for annual physical.  Health Maintenance counseling: 1. Anticipatory guidance: Patient counseled regarding regular dental exams -q6 months, eye exams - yearly,  avoiding smoking and second hand smoke , limiting alcohol to 1 beverage per day- doesn't drink .   2. Risk factor reduction:  Advised patient of need for regular exercise and diet rich and fruits and vegetables to reduce risk of heart attack and stroke. Exercise- 2-3 days a week-  walking for half a mile 10 minutes. Diet-weight reasonably stable- down 10 lbs since June- congratulated her  Wt Readings from Last 3 Encounters:  07/04/19 254 lb 3.2 oz (115.3 kg)  05/28/19 255 lb 15.2 oz (116.1 kg)  04/30/19 255 lb 15.2 oz (116.1 kg)  3. Immunizations/screenings/ancillary studies-flu shot today  Immunization History  Administered Date(s) Administered  . H1N1 09/24/2008  . Hep A / Hep B 02/20/2018, 04/03/2018, 08/23/2018  . Influenza Split 07/07/2011, 07/18/2012  . Influenza Whole 08/15/2007, 06/26/2008, 07/23/2009, 07/16/2010  . Influenza, Seasonal, Injecte, Preservative Fre 10/26/2011, 07/18/2012  . Influenza,inj,Quad PF,6+ Mos 06/12/2013, 07/15/2014, 08/08/2015, 06/22/2016, 07/01/2017, 07/14/2018  . Influenza-Unspecified 09/24/2008  . Pneumococcal Conjugate-13 05/06/2014  . Pneumococcal Polysaccharide-23 09/30/2008, 02/13/2013  .  Td 05/21/2010  . Zoster Recombinat (Shingrix) 03/02/2017, 05/02/2017  4. Cervical cancer screening- will be due early 2021, February 2018 with 3-year follow-up with [facility] 5. Breast cancer screening-  breast exam - with GYN- and mammogram May 28, 2019-recommended yearly 6. Colon cancer screening - April 29, 2010 with 10-year follow-up so due next year 7. Skin cancer screening-  No dermatologist. advised regular sunscreen use. Denies worrisome, changing, or new skin lesions.  8. Birth control/STD check- postmenopausal and monogomous 9. Osteoporosis screening at 65- bone density recommended due to steroids used in past. Does take 2000 units vitamin D -Never smoker  Status of chronic or acute concerns   Upcoming left hammertoe surgery/redo- will bring by forms. Has done well with multiple recent surgeries and no worseningshortness of breath or  No exertional chest pain (can climb flight of stairs without chest pain- would feel reasonable to clear her  (as long as blood pressure goes down when gets meds in later today)   Sarcoidosis- on breo, albuterol. chornic 5 mg prednisone. Chronic prednisone produces cushingoid effects  GERD- on dexilant reasonable control- also has to take sparing dexilant  Hyperlipidemia- controlled on Lipitor 40mg  in the past-update lipid panel today. Also on fenofibrate Lab Results  Component Value Date   CHOL 163 03/10/2017   HDL 35 (L) 03/10/2017   LDLCALC 49 03/10/2017   LDLDIRECT 76.0 01/15/2016   TRIG 197 (H) 09/20/2017   CHOLHDL 4.7 03/10/2017    Gout- controlled on Allopurinol, no flare ups lately.  Last uric acid was under 6, update uric acid level today update today 6-update today 6-update today Lab Results  Component Value Date   LABURIC 5.1 01/26/2017    HTN- controlled on Carvedilol 25mg , Cardizem 120mg , micardis 20mg , imdur 30mg  lasix 40mg  BID (also on potassium). Pt states BPs have been below 140/90 consistently-110s to 130s mostly  BP Readings from Last 3 Encounters:  07/04/19 (!) 146/60  04/12/19 137/72  04/10/19 (!) 152/59  she did not take meds this Am as fasting- she will update me later this week- with goal <140/90 to be able to move forward with surgery  Diabetes-managed by endocrine.  Lantus, Humalog, Metformin 1000mg . Pt states CBGs have been up due to Prednisone and diet and exercise have not been good. Lab Results  Component Value Date   HGBA1C 8.2 (H) 03/12/2019  we willl check a1c and she agrees to call to schedule follow up with Dr. Loanne Drilling  Gabapentin reasonably helpful for diabetic neuropathy- wants to contiue.   Depression/Anxiety- controlled on Wellbutrin XL 300MG , Lorazepam 0.5mg - - not using often at all. paxil for hot flashes. Also on velnlafaxine- bad side effects if misses dose.   GERD- controlled on Dexilant 60mg , pepcid 20mg . No recent b12- will check today  Hypertensive heart disease with chronic diastoli CHF- on lasix 40mg  BID- doing well- no recent weight gain  CKD stage III- monitor GFR today with labs.   Anemia of chronic  disease- multiple medical issues- monitor cbc today  Mild sinus congestion for a few days- chronic recurrent issues- she will let me know if worsening- has ended up hospitalized from respiratory issues if not addressed and significantly worsening.   constipation linzess every other day- lactulose as back up.   Coronary vasospasm- has imdur on hand and also nitroglycerin but not really using.   Recommended follow up:  4 month follow up or sooner if needed Future Appointments  Date Time Provider [place]  07/26/2019  1:15 PM  Newt Minion, MD [facility] None  08/13/2019  3:20 PM Leonie Man, MD [facility]   Lab/Order associations: fasting No diagnosis found.  No orders of the defined types were placed in this encounter.   Return precautions advised.  Garret Reddish, MD

## 2019-07-04 NOTE — Patient Instructions (Addendum)
Health Maintenance Due  Topic Date Due  . INFLUENZA VACCINE- flu shot given today 05/12/2019   Schedule your bone density test at check out desk. You may also call directly to X-ray at [phone] to schedule an appointment that is convenient for you.  - located [address] across the street from [place] - in the basement - you do need an appointment for the bone density tests.    Update me with blood pressure when you get meds in - wait at least 2 hours and goal <140/90  we willl check a1c and she agrees to call to schedule follow up with Dr. Loanne Drilling  If things are not going well with covid in 4 months- we cna push out follow up to 6 months    Please stop by lab before you go If you do not have mychart- we will call you about results within 5 business days of [place] receiving them.  If you have mychart- we will send your results within 3 business days of [place] receiving them.  If abnormal or we want to clarify a result, we will call or mychart you to make sure you receive the message.  If you have questions or concerns or don't hear within 5-7 days, please send [place] a message or call us.

## 2019-07-05 ENCOUNTER — Telehealth: Payer: Self-pay | Admitting: *Deleted

## 2019-07-05 DIAGNOSIS — Z01812 Encounter for preprocedural laboratory examination: Secondary | ICD-10-CM

## 2019-07-05 NOTE — Telephone Encounter (Signed)
I left [name] a message that we need to change her surgery date to 08/10/2019.  I asked her to give me a call back if this is going to be a problem.

## 2019-07-08 ENCOUNTER — Telehealth: Payer: Self-pay | Admitting: Endocrinology

## 2019-07-08 NOTE — Telephone Encounter (Signed)
1.  Please schedule f/u appt 2.  Then please refill x 1, pending that appt.  

## 2019-07-09 ENCOUNTER — Inpatient Hospital Stay: Admission: RE | Admit: 2019-07-09 | Payer: 59 | Source: Ambulatory Visit

## 2019-07-09 ENCOUNTER — Other Ambulatory Visit: Payer: Self-pay

## 2019-07-09 DIAGNOSIS — E1129 Type 2 diabetes mellitus with other diabetic kidney complication: Secondary | ICD-10-CM

## 2019-07-09 DIAGNOSIS — Z794 Long term (current) use of insulin: Secondary | ICD-10-CM

## 2019-07-09 MED ORDER — METFORMIN HCL 1000 MG PO TABS: 1000.0000 mg | ORAL_TABLET | Freq: Two times a day (BID) | ORAL | 0 refills | Status: DC

## 2019-07-09 NOTE — Telephone Encounter (Signed)
Per Dr. Ellison, unable to refill Metformin without an appt. Routing this message to the front desk for scheduling purposes.  

## 2019-07-09 NOTE — Telephone Encounter (Signed)
October 5th @ 8 a.m.

## 2019-07-09 NOTE — Telephone Encounter (Signed)
metFORMIN (GLUCOPHAGE) 1000 MG tablet 60 tablet 0 07/09/2019    Sig - Route: Take 1 tablet (1,000 mg total) by mouth 2 (two) times daily with a meal. MUST CALL TO SCHEDULE AN APPT. - Oral   Sent to pharmacy as: metFORMIN (GLUCOPHAGE) 1000 MG tablet   Notes to Pharmacy: Overdue for an appt. Will only provide 30 day supply. Future refill requests will require an appt   E-Prescribing Status: Receipt confirmed by pharmacy (07/09/2019 1:44 PM EDT)

## 2019-07-12 ENCOUNTER — Other Ambulatory Visit: Payer: Self-pay

## 2019-07-16 ENCOUNTER — Other Ambulatory Visit: Payer: Self-pay

## 2019-07-16 ENCOUNTER — Encounter: Payer: Self-pay | Admitting: Endocrinology

## 2019-07-16 ENCOUNTER — Ambulatory Visit (INDEPENDENT_AMBULATORY_CARE_PROVIDER_SITE_OTHER): Payer: 59 | Admitting: Endocrinology

## 2019-07-16 ENCOUNTER — Ambulatory Visit: Admission: RE | Admit: 2019-07-16 | Payer: 59 | Source: Ambulatory Visit

## 2019-07-16 VITALS — BP 124/60 | HR 80 | Ht 66.0 in | Wt 256.8 lb

## 2019-07-16 DIAGNOSIS — Z794 Long term (current) use of insulin: Secondary | ICD-10-CM

## 2019-07-16 DIAGNOSIS — E1129 Type 2 diabetes mellitus with other diabetic kidney complication: Secondary | ICD-10-CM | POA: Diagnosis not present

## 2019-07-16 MED ORDER — INSULIN LISPRO (1 UNIT DIAL) 100 UNIT/ML (KWIKPEN): 30.0000 [IU] | PEN_INJECTOR | Freq: Every day | SUBCUTANEOUS | 11 refills | Status: DC

## 2019-07-16 MED ORDER — LANTUS SOLOSTAR 100 UNIT/ML ~~LOC~~ SOPN: 60.0000 [IU] | PEN_INJECTOR | SUBCUTANEOUS | 11 refills | Status: DC

## 2019-07-16 NOTE — Patient Instructions (Addendum)
Please increase the basaglar to 60 units each morning.  please take the same Novolog with supper.   check your blood sugar twice a day.  vary the time of day when you check, between before the 3 meals, and at bedtime.  also check if you have symptoms of your blood sugar being too high or too low.  please keep a record of the readings and bring it to your next appointment here (or you can bring the meter itself).  You can write it on any piece of paper.  please call us sooner if your blood sugar goes below 70, or if you have a lot of readings over 200. Please come back for a follow-up appointment in 2 months.

## 2019-07-16 NOTE — Progress Notes (Signed)
Subjective:    Patient ID: [name], female    DOB: 1961-05-27, 58 y.o.   MRN: [id number]  HPI Pt returns for f/u of diabetes mellitus: DM type: Insulin-requiring type 2 Dx'ed: [id number] Complications: polyneuropathy and mild CAD Therapy: insulin since 2006, and 2 oral meds.  GDM: never DKA: never Severe hypoglycemia: never Pancreatitis: never Pancreatic imaging: CT (2015) showed fatty atrophy.  Other: she takes multiple daily injections; she intermittently takes prednisone for sarcoidosis or AB.  Interval history: She has recently been on oral steroids for left shoulder pain, but she is off now.  no cbg record, but states cbg's vary from 140-250.  She takes insulin as rx'ed.   Past Medical History:  Diagnosis Date  . Abnormal SPEP 04/17/2014  . Acute left ankle pain 01/26/2017  . ANEMIA-UNSPECIFIED 09/18/2009  . Anxiety   . Arthritis   . Chronic diastolic heart failure, NYHA class 2 (HCC)    Normal LVEDP by May 2018  . COPD (chronic obstructive pulmonary disease) ([place])   . Depression   . DIABETES MELLITUS, TYPE II 08/21/2006  . Diabetic osteomyelitis ([place]) 05/29/2015  . Fracture of 5th metatarsal    non union  . GERD [id number]   Had fundoplication  . GOUT 08/20/2010  . Hammer toe of right foot    2-5th toes  . Hx of umbilical hernia repair   . HYPERLIPIDEMIA 08/21/2006  . HYPERTENSION 08/21/2006  . Infection of wound due to methicillin resistant Staphylococcus aureus (MRSA)   . Internal hemorrhoids   . Multiple allergies 10/14/2016  . OBESITY 06/04/2009  . Onychomycosis 10/27/2015  . Osteomyelitis of left foot ([place]) 05/29/2015  . Pulmonary sarcoidosis ([place])    Followed locally by pulmonology, but also by Dr. Casper Harrison at [facility]  . Right knee pain 01/26/2017  . Vocal cord dysfunction   . Wears partial dentures     Past Surgical History:  Procedure Laterality Date  . ABDOMINAL HYSTERECTOMY    . APPENDECTOMY    . BLADDER SUSPENSION  11/11/2011   Procedure: TRANSVAGINAL TAPE (TVT) PROCEDURE;  Surgeon: Olga Millers, MD;  Location: [facility];  Service: Gynecology;  Laterality: N/A;  . CAROTIDS  02/18/11   CAROTID DUPLEX; VERTEBRALS ARE PATENT WITH ANTEGRADE FLOW. ICA/CCA RATIO 1.61 ON RIGHT AND 0.75 ON LEFT  . CHOLECYSTECTOMY  1984  . CYSTOSCOPY  11/11/2011   Procedure: CYSTOSCOPY;  Surgeon: Olga Millers, MD;  Location: [facility];  Service: Gynecology;  Laterality: N/A;  . EXTUBATION (ENDOTRACHEAL) IN OR N/A 09/23/2017   Procedure: EXTUBATION (ENDOTRACHEAL) IN OR;  Surgeon: Helayne Seminole, MD;  Location: [facility];  Service: ENT;  Laterality: N/A;  . FIBEROPTIC LARYNGOSCOPY AND TRACHEOSCOPY N/A 09/23/2017   Procedure: FLEXIBLE FIBEROPTIC LARYNGOSCOPY;  Surgeon: Helayne Seminole, MD;  Location: [facility];  Service: ENT;  Laterality: N/A;  . FRACTURE SURGERY     foot  . HALLUX FUSION Left 10/02/2018   Procedure: HALLUX ARTHRODESIS;  Surgeon: Edrick Kins, DPM;  Location: [facility];  Service: Podiatry;  Laterality: Left;  . HAMMER TOE SURGERY Left 10/02/2018   Procedure: HAMMER TOE CORRECTION 2ND 3RD 4RD FIFTH TOE;  Surgeon: Edrick Kins, DPM;  Location: [facility];  Service: Podiatry;  Laterality: Left;  . HAMMER TOE SURGERY Right 04/12/2019   Procedure: HAMMER TOE CORRECTION, 2ND, 3RD, 4TH AND 5TH TOES OF RIGHT FOOT;  Surgeon: Edrick Kins, DPM;  Location: [facility];  Service: Podiatry;  Laterality: Right;  . HERNIA REPAIR    .  I&D EXTREMITY Left 06/27/2015   Procedure: Partial Excision Left Calcaneus, Place Antibiotic Beads, and Wound VAC;  Surgeon: Newt Minion, MD;  Location: [facility];  Service: Orthopedics;  Laterality: Left;  . KNEE ARTHROSCOPY     right  . LEFT AND RIGHT HEART CATHETERIZATION WITH CORONARY ANGIOGRAM N/A 04/23/2013   Procedure: LEFT AND RIGHT HEART CATHETERIZATION WITH CORONARY ANGIOGRAM;  Surgeon: Leonie Man, MD;  Location: [facility] C[facility];  Service: Cardiovascular;  Laterality: N/A;  . LEFT HEART CATH AND CORONARY  ANGIOGRAPHY N/A 03/11/2017   Procedure: Left Heart Cath and Coronary Angiography;  Surgeon: Sherren Mocha, MD;  Location: [facility] CV LAB; angiographically minimal CAD in the LAD otherwise normal.;  Normal LVEDP.  FALSE POSITIVE MYOVIEW  . LEFT HEART CATH AND CORONARY ANGIOGRAPHY  07/20/2010   LVEF 50-55% WITH VERY MILD GLOBAL HYPOKINESIA; ESSENTIALLY NORMAL CORONARY ARTERIES; NORMAL LV FUNCTION  . METATARSAL OSTEOTOMY WITH OPEN REDUCTION INTERNAL FIXATION (ORIF) METATARSAL WITH FUSION Left 04/09/2014   Procedure: LEFT FOOT FRACTURE OPEN TREATMENT METATARSAL INCLUDES INTERNAL FIXATION EACH;  Surgeon: Lorn Junes, MD;  Location: [facility];  Service: Orthopedics;  Laterality: Left;  . NISSEN FUNDOPLICATION  [id number]  . NM MYOVIEW LTD  03/09/2013   Lexiscan --> EF 50%; NORMAL MYOCARDIAL PERFUSION STUDY - breast attenuation  . NM MYOVIEW LTD  03/10/2017   : Moderate size "stress-induced "perfusion defect at the apex as well as "ill-defined stress-induced perfusion defect in the lateral wall.  EF 55%.  INTERMEDIATE risk. -->  FALSE POSITIVE  . Right and left CARDIAC CATHETERIZATION  04/23/2013   Angiographic normal coronaries; LVEDP 20 mmHg, PCWP 12-14 mmHg, RAP 12 mmHg.; Fick CO/CI 4.9/2.2  . SHOULDER ARTHROSCOPY Left 03/14/2019   Procedure: LEFT SHOULDER ARTHROSCOPY, DEBRIDEMENT, AND DECOMPRESSION;  Surgeon: Newt Minion, MD;  Location: [facility];  Service: Orthopedics;  Laterality: Left;  . TOTAL KNEE ARTHROPLASTY Right 06/29/2017   Procedure: RIGHT TOTAL KNEE ARTHROPLASTY;  Surgeon: Newt Minion, MD;  Location: [facility];  Service: Orthopedics;  Laterality: Right;  . TOTAL KNEE ARTHROPLASTY Left 12/07/2017  . TOTAL KNEE ARTHROPLASTY Left 12/07/2017   Procedure: LEFT TOTAL KNEE ARTHROPLASTY;  Surgeon: Newt Minion, MD;  Location: [facility];  Service: Orthopedics;  Laterality: Left;  . TRACHEOSTOMY TUBE PLACEMENT N/A 09/20/2017   Procedure: AWAKE INTUBATION WITH ANESTHESIA WITH VIDEO  ASSISTANCE;  Surgeon: Helayne Seminole, MD;  Location: [facility];  Service: ENT;  Laterality: N/A;  . TRANSTHORACIC ECHOCARDIOGRAM  08/2014   Normal LV size and function.  Mild LVH.  EF 55-60%.  Normal regional wall motion.  GR 1 DD.  Normal RV size and function .  [name] TUBAL LIGATION     with reversal in 1994  . VENTRAL HERNIA REPAIR      Social History   Socioeconomic History  . Marital status: Married    Spouse name: TERIANNE SCANTLING  . Number of children: 2  . Years of education: 48  . Highest education level: Not on file  Occupational History  . Occupation: DISABLED  Social Needs  . Financial resource strain: Not on file  . Food insecurity    Worry: Not on file    Inability: Not on file  . Transportation needs    Medical: Not on file    Non-medical: Not on file  Tobacco Use  . Smoking status: Never Smoker  . Smokeless tobacco: Never Used  Substance and Sexual Activity  . Alcohol use: No  Alcohol/week: 0.0 standard drinks  . Drug use: No  . Sexual activity: Yes    Birth control/protection: Surgical  Lifestyle  . Physical activity    Days per week: Not on file    Minutes per session: Not on file  . Stress: Not on file  Relationships  . Social Herbalist on phone: Not on file    Gets together: Not on file    Attends religious service: Not on file    Active member of club or organization: Not on file    Attends meetings of clubs or organizations: Not on file    Relationship status: Not on file  . Intimate partner violence    Fear of current or ex partner: Not on file    Emotionally abused: Not on file    Physically abused: Not on file    Forced sexual activity: Not on file  Other Topics Concern  . Not on file  Social History Narrative   Married 1994. 2 sons who both live close and 1 grandson.       Disability due to sarcoidosis. 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 albuterol (PROVENTIL HFA;VENTOLIN HFA) 108 (90 Base) MCG/ACT inhaler Inhale 1-2 puffs into the lungs every 6 (six) hours as needed for wheezing or shortness of breath. 8 g 5  . allopurinol (ZYLOPRIM) 100 MG tablet Take 100 mg by mouth every evening.     [name] aspirin EC 81 MG tablet Take 81 mg by mouth daily.     [name] atorvastatin (LIPITOR) 40 MG tablet TAKE 1 TAB BY MOUTH ONCE DAILY (Patient taking differently: Take 40 mg by mouth every evening. ) 90 tablet 3  . BREO ELLIPTA 200-25 MCG/INH AEPB TAKE 1 PUFF BY MOUTH EVERY DAY 60 each 5  . buPROPion (WELLBUTRIN XL) 300 MG 24 hr tablet TAKE 1 TABLET BY MOUTH EVERY DAY (Patient taking differently: Take 300 mg by mouth daily. ) 90 tablet 3  . carvedilol (COREG) 25 MG tablet TAKE 1 TABLET (25 MG TOTAL) BY MOUTH 2 (TWO) TIMES DAILY WITH A MEAL. 180 tablet 3  . cholecalciferol (VITAMIN D) 25 MCG (1000 UT) tablet Take 1,000 Units by mouth 2 (two) times a day.    [name] dexlansoprazole (DEXILANT) 60 MG capsule Take 1 capsule (60 mg total) by mouth daily. 30 capsule 5  . diltiazem (CARDIZEM CD) 120 MG 24 hr capsule Take 1 capsule (120 mg total) by mouth daily. 30 capsule 2  . famotidine (PEPCID) 20 MG tablet Take 1 tablet (20 mg total) by mouth at bedtime. 30 tablet 3  . fenofibrate 160 MG tablet TAKE 1 TABLET BY MOUTH EVERY DAY 90 tablet 0  . fluticasone (FLONASE) 50 MCG/ACT nasal spray PLACE 2 SPRAYS INTO BOTH NOSTRILS DAILY AS NEEDED FOR ALLERGIES 48 mL 1  . furosemide (LASIX) 40 MG tablet TAKE 1 TABLET BY MOUTH TWICE A DAY 180 tablet 3  . gabapentin (NEURONTIN) 100 MG capsule TAKE 1 CAPSULE BY MOUTH THREE TIMES A DAY (Patient taking differently: Take 100-200 mg by mouth See admin instructions. Take 1 capsule (100 mg) by mouth in the morning & 2 capsules (200 mg) by mouth at night.) 270 capsule 3  . glucose blood  (  ONETOUCH VERIO) test strip 1 each by Other route 2 (two) times daily. Use as instructed 200 each 3  . HYDROcodone-acetaminophen (NORCO/VICODIN) 5-325 MG tablet Take 1 tablet by mouth every 6 (six) hours as needed for moderate pain. 30 tablet 0  . Insulin Pen Needle 33G X 4 MM MISC 1 each by Does not apply route 4 (four) times daily. 200 each 11  . isosorbide mononitrate (IMDUR) 30 MG 24 hr tablet Take 1 tablet (30 mg total) by mouth at bedtime. 90 tablet 1  . KLOR-CON M20 20 MEQ tablet TAKE 1 & 1/2 TABLETS BY MOUTH TWICE A DAY (Patient taking differently: Take 30 mEq by mouth 2 (two) times a day. ) 270 tablet 2  . lactulose (CHRONULAC) 10 GM/15ML solution Take 10 g by mouth daily.     [name] linaclotide (LINZESS) 290 MCG CAPS capsule Take 1 capsule (290 mcg total) by mouth daily before breakfast. 30 capsule 11  . LORazepam (ATIVAN) 0.5 MG tablet Take 0.5 mg by mouth every 12 (twelve) hours as needed for anxiety.    . metFORMIN (GLUCOPHAGE) 1000 MG tablet Take 1 tablet (1,000 mg total) by mouth 2 (two) times daily with a meal. MUST CALL TO SCHEDULE AN APPT. 60 tablet 0  . naproxen (NAPROSYN) 500 MG tablet Take 1 tablet (500 mg total) by mouth 2 (two) times daily with a meal. (Patient taking differently: Take 500 mg by mouth 2 (two) times daily as needed (pain). ) 60 tablet 1  . nitroGLYCERIN (NITROSTAT) 0.4 MG SL tablet Place 1 tablet (0.4 mg total) under the tongue every 5 (five) minutes as needed for chest pain. Reported on 01/05/2016 25 tablet 6  . ondansetron (ZOFRAN-ODT) 4 MG disintegrating tablet Take 1 tablet (4 mg total) by mouth every 8 (eight) hours as needed for nausea or vomiting. 50 tablet 2  . OneTouch Delica Lancets 99991111 MISC 1 each by Does not apply route 2 (two) times a day. E11.29 100 each 2  . oxyCODONE-acetaminophen (PERCOCET) 5-325 MG tablet Take 1 tablet by mouth every 6 (six) hours as needed for severe pain. 30 tablet 0  . PARoxetine Mesylate (BRISDELLE) 7.5 MG CAPS Take 7.5 mg by mouth  daily.     . predniSONE (DELTASONE) 5 MG tablet Take 5 mg by mouth daily with breakfast.    . telmisartan (MICARDIS) 20 MG tablet TAKE 1 TABLET BY MOUTH EVERY DAY 90 tablet 1  . venlafaxine XR (EFFEXOR-XR) 75 MG 24 hr capsule Take 1 capsule (75 mg total) by mouth 2 (two) times daily. (Patient taking differently: Take 75 mg by mouth every evening. ) 180 capsule 3  . [DISCONTINUED] mupirocin nasal ointment (BACTROBAN) 2 % Place 1 application into the nose 2 (two) times daily. Use one-half of tube in each nostril twice daily for five (5) days. After application, press sides of nose together and gently massage. 10 g 0   No current facility-administered medications on file prior to visit.     Allergies  Allergen Reactions  . Methotrexate Other (See Comments)    Peri-oral and buccal lesions  . Vancomycin Other (See Comments)    DOSE RELATED NEPHROTOXICITY  . Lisinopril Cough  . Chlorhexidine Itching  . Clindamycin/Lincomycin Nausea And Vomiting and Rash  . Doxycycline Rash  . Teflaro [Ceftaroline] Rash and Other (See Comments)    Tolerates ceftriaxone     Family History  Problem Relation Age of Onset  . Diabetes Father   . Heart attack Father 24  .  Coronary artery disease Father   . Heart failure Father   . Throat cancer Father   . COPD Mother   . Emphysema Mother   . Asthma Mother   . Heart failure Mother   . Breast cancer Mother   . Heart attack Maternal Grandfather   . Sarcoidosis Maternal Uncle   . Lung cancer Brother   . Diabetes Brother   . Colon cancer Neg Hx   . Rectal cancer Neg Hx     BP 124/60 (BP Location: Left Arm, Patient Position: Sitting, Cuff Size: Large)   Pulse 80   Ht 5\' 6"  (1.676 m)   Wt 256 lb 12.8 oz (116.5 kg)   SpO2 96%   BMI 41.45 kg/m   Review of Systems She denies hypoglycemia    Objective:   Physical Exam VITAL SIGNS:  See vs page GENERAL: no distress Pulses: dorsalis pedis intact bilat.   MSK: no deformity of the feet CV: trace bilat  leg edema, and bilat vv's Skin:  no ulcer on the feet.  normal color and temp on the feet. Neuro: sensation is intact to touch on the feet.  Ext: there is bilateral onychomycosis of the toenails  Lab Results  Component Value Date   CREATININE 0.95 07/04/2019   BUN 14 07/04/2019   NA 137 07/04/2019   K 4.3 07/04/2019   CL 96 07/04/2019   CO2 29 07/04/2019     Lab Results  Component Value Date   HGBA1C 9.9 (H) 07/04/2019      Assessment & Plan:  Insulin-requiring type 2 DM, with CAD: worse Shoulder pain: steroid rx is affecting A1c Edema: This limits rx options  Patient Instructions  Please increase the basaglar to 60 units each morning.  please take the same Novolog with supper.   check your blood sugar twice a day.  vary the time of day when you check, between before the 3 meals, and at bedtime.  also check if you have symptoms of your blood sugar being too high or too low.  please keep a record of the readings and bring it to your next appointment here (or you can bring the meter itself).  You can write it on any piece of paper.  please call us sooner if your blood sugar goes below 70, or if you have a lot of readings over 200. Please come back for a follow-up appointment in 2 months.

## 2019-07-22 ENCOUNTER — Encounter (HOSPITAL_COMMUNITY): Payer: Self-pay

## 2019-07-22 ENCOUNTER — Other Ambulatory Visit: Payer: Self-pay

## 2019-07-22 ENCOUNTER — Emergency Department (HOSPITAL_COMMUNITY): Payer: 59

## 2019-07-22 ENCOUNTER — Other Ambulatory Visit: Payer: Self-pay | Admitting: Cardiology

## 2019-07-22 ENCOUNTER — Emergency Department (HOSPITAL_COMMUNITY)
Admission: EM | Admit: 2019-07-22 | Discharge: 2019-07-22 | Disposition: A | Discharge status: Home / Self Care | Payer: 59 | Attending: Emergency Medicine | Admitting: Emergency Medicine

## 2019-07-22 DIAGNOSIS — Z794 Long term (current) use of insulin: Secondary | ICD-10-CM | POA: Diagnosis not present

## 2019-07-22 DIAGNOSIS — S42212A Unspecified displaced fracture of surgical neck of left humerus, initial encounter for closed fracture: Secondary | ICD-10-CM | POA: Diagnosis not present

## 2019-07-22 DIAGNOSIS — E1122 Type 2 diabetes mellitus with diabetic chronic kidney disease: Secondary | ICD-10-CM | POA: Diagnosis not present

## 2019-07-22 DIAGNOSIS — Y939 Activity, unspecified: Secondary | ICD-10-CM | POA: Insufficient documentation

## 2019-07-22 DIAGNOSIS — Z79899 Other long term (current) drug therapy: Secondary | ICD-10-CM | POA: Insufficient documentation

## 2019-07-22 DIAGNOSIS — I130 Hypertensive heart and chronic kidney disease with heart failure and stage 1 through stage 4 chronic kidney disease, or unspecified chronic kidney disease: Secondary | ICD-10-CM | POA: Insufficient documentation

## 2019-07-22 DIAGNOSIS — N183 Chronic kidney disease, stage 3 unspecified: Secondary | ICD-10-CM | POA: Diagnosis not present

## 2019-07-22 DIAGNOSIS — S4992XA Unspecified injury of left shoulder and upper arm, initial encounter: Secondary | ICD-10-CM | POA: Diagnosis present

## 2019-07-22 DIAGNOSIS — Z7982 Long term (current) use of aspirin: Secondary | ICD-10-CM | POA: Diagnosis not present

## 2019-07-22 DIAGNOSIS — W19XXXA Unspecified fall, initial encounter: Secondary | ICD-10-CM | POA: Insufficient documentation

## 2019-07-22 DIAGNOSIS — Y9222 Religious institution as the place of occurrence of the external cause: Secondary | ICD-10-CM | POA: Insufficient documentation

## 2019-07-22 DIAGNOSIS — I509 Heart failure, unspecified: Secondary | ICD-10-CM | POA: Insufficient documentation

## 2019-07-22 DIAGNOSIS — Y999 Unspecified external cause status: Secondary | ICD-10-CM | POA: Diagnosis not present

## 2019-07-22 MED ORDER — OXYCODONE-ACETAMINOPHEN 5-325 MG PO TABS: 1.0000 | ORAL_TABLET | Freq: Four times a day (QID) | ORAL | 0 refills | Status: DC | PRN

## 2019-07-22 MED ORDER — OXYCODONE-ACETAMINOPHEN 5-325 MG PO TABS
1.0000 | ORAL_TABLET | Freq: Once | ORAL | Status: AC
  Administered 2019-07-22: 1 via ORAL
  Filled 2019-07-22: qty 1

## 2019-07-22 MED ORDER — HYDROMORPHONE HCL 1 MG/ML IJ SOLN
1.0000 mg | Freq: Once | INTRAMUSCULAR | Status: AC
  Administered 2019-07-22: 1 mg via INTRAMUSCULAR
  Filled 2019-07-22: qty 1

## 2019-07-22 NOTE — ED Provider Notes (Signed)
[facility] EMERGENCY DEPARTMENT Provider Note   CSN: [id number] Arrival date & time: 07/22/19  1314    History   Chief Complaint Fall, left arm pain  HPI [name] is a 58 y.o. female with past medical history significant for diabetes, anemia, pretension, hyperlipidemia who presents for evaluation of fall.  Patient states she tripped and fell at church just PTA.  Patient states she fell on her left shoulder and felt immediate pain and a "pop sensation.  He was given 100 mcg fentanyl by EMS however she continues to have 10 out of 10 pain to her left shoulder.  She denies hitting her head, LOC.  Pain radiates from her left shoulder down into her humerus.  Not anticoagulation.  Denies headache, vision changes, neck pain, neck stiffness, CP, SOB , ABD pain, pain to mid-line spinal column,  Pain and decreased ROM to lower extremities, bowel or bladder incontinence, saddle paresthesias. Admits to decreased ROm to LUE at the shoulder secondary to pain. Denies paresthesias.  Patient is followed by Dr. Sharol Given with orthopedic surgery.  She was last seen 3 weeks ago for impingement of her left shoulder.  Patient is 5 months postop left shoulder arthroplasty who has persistent pain.  She is currently physical therapy and when was last seen by orthopedics complain of decreased range of motion to her left shoulder.  History obtained from patient, EMS and past medical records.  No interpreter was used.     HPI  Past Medical History:  Diagnosis Date  . Abnormal SPEP 04/17/2014  . Acute left ankle pain 01/26/2017  . ANEMIA-UNSPECIFIED 09/18/2009  . Anxiety   . Arthritis   . Chronic diastolic heart failure, NYHA class 2 (HCC)    Normal LVEDP by May 2018  . COPD (chronic obstructive pulmonary disease) ([place])   . Depression   . DIABETES MELLITUS, TYPE II 08/21/2006  . Diabetic osteomyelitis ([place]) 05/29/2015  . Fracture of 5th metatarsal    non union  . GERD [id number]   Had  fundoplication  . GOUT 08/20/2010  . Hammer toe of right foot    2-5th toes  . Hx of umbilical hernia repair   . HYPERLIPIDEMIA 08/21/2006  . HYPERTENSION 08/21/2006  . Infection of wound due to methicillin resistant Staphylococcus aureus (MRSA)   . Internal hemorrhoids   . Multiple allergies 10/14/2016  . OBESITY 06/04/2009  . Onychomycosis 10/27/2015  . Osteomyelitis of left foot ([place]) 05/29/2015  . Pulmonary sarcoidosis ([place])    Followed locally by pulmonology, but also by Dr. Casper Harrison at [facility]  . Right knee pain 01/26/2017  . Vocal cord dysfunction   . Wears partial dentures     Patient Active Problem List   Diagnosis Date Noted  . Nontraumatic complete tear of left rotator cuff   . Impingement syndrome of left shoulder   . Diabetic neuropathy ([place]) 02/20/2019  . Severe persistent asthma 01/04/2019  . Subcortical microvascular ischemic occlusive disease 07/12/2018  . Rapid palpitations 07/12/2018  . Impingement of left ankle joint 06/26/2018  . Constipation 04/21/2018  . Anxiety 04/05/2018  . Total knee replacement status, left 12/07/2017  . Dysphagia 09/20/2017  . Epiglottitis   . Plantar fasciitis, right 07/13/2017  . S/P total knee arthroplasty, right 06/29/2017  . Osteoarthrosis, localized, primary, knee, right   . Sprain of calcaneofibular ligament of right ankle 06/06/2017  . Osteoarthritis of right knee 01/26/2017  . Onychomycosis 10/27/2015  . CKD (chronic kidney disease) stage 3, GFR  30-59 ml/min 04/27/2015  . MRSA (methicillin resistant staph aureus) culture positive 03/27/2015  . History of osteomyelitis 03/27/2015  . Fatty liver 09/30/2014  . Hot flashes 07/15/2014  . Abnormal SPEP 04/17/2014  . Fracture of left leg 04/17/2014  . Cushingoid side effect of steroids ([place]) 04/17/2014  . Internal hemorrhoids   . Major depression in full remission ([place])   . Preoperative clearance 03/25/2014  . Obstructive chronic bronchitis without exacerbation  ([place]) 09/18/2013  . Chest pain 04/11/2013  . Hypertensive heart disease with chronic diastolic congestive heart failure ([place])   . Solitary pulmonary nodule, on CT 02/2013 - stable over 2 years in 2015 02/20/2013  . Gout 08/20/2010  . Anemia 09/18/2009  . Morbid obesity ([place]) 06/04/2009  . Sleep apnea 04/21/2009  . Sarcoidosis of lung ([place]) 04/10/2007  . Hyperlipidemia associated with type 2 diabetes mellitus ([place]) 08/21/2006  . Hypertension associated with diabetes ([place]) 08/21/2006  . GERD 08/21/2006  . Type II diabetes mellitus with renal manifestations ([place]) 08/21/2006    Past Surgical History:  Procedure Laterality Date  . ABDOMINAL HYSTERECTOMY    . APPENDECTOMY    . BLADDER SUSPENSION  11/11/2011   Procedure: TRANSVAGINAL TAPE (TVT) PROCEDURE;  Surgeon: Olga Millers, MD;  Location: [place] [facility];  Service: Gynecology;  Laterality: N/A;  . CAROTIDS  02/18/11   CAROTID DUPLEX; VERTEBRALS ARE PATENT WITH ANTEGRADE FLOW. ICA/CCA RATIO 1.61 ON RIGHT AND 0.75 ON LEFT  . CHOLECYSTECTOMY  1984  . CYSTOSCOPY  11/11/2011   Procedure: CYSTOSCOPY;  Surgeon: Olga Millers, MD;  Location: [place] [facility];  Service: Gynecology;  Laterality: N/A;  . EXTUBATION (ENDOTRACHEAL) IN OR N/A 09/23/2017   Procedure: EXTUBATION (ENDOTRACHEAL) IN OR;  Surgeon: Helayne Seminole, MD;  Location: [place];  Service: ENT;  Laterality: N/A;  . FIBEROPTIC LARYNGOSCOPY AND TRACHEOSCOPY N/A 09/23/2017   Procedure: FLEXIBLE FIBEROPTIC LARYNGOSCOPY;  Surgeon: Helayne Seminole, MD;  Location: [place];  Service: ENT;  Laterality: N/A;  . FRACTURE SURGERY     foot  . HALLUX FUSION Left 10/02/2018   Procedure: HALLUX ARTHRODESIS;  Surgeon: Edrick Kins, DPM;  Location: [facility];  Service: Podiatry;  Laterality: Left;  . HAMMER TOE SURGERY Left 10/02/2018   Procedure: HAMMER TOE CORRECTION 2ND 3RD 4RD FIFTH TOE;  Surgeon: Edrick Kins, DPM;  Location: [facility];  Service: Podiatry;  Laterality: Left;  . HAMMER TOE SURGERY Right  04/12/2019   Procedure: HAMMER TOE CORRECTION, 2ND, 3RD, 4TH AND 5TH TOES OF RIGHT FOOT;  Surgeon: Edrick Kins, DPM;  Location: [place];  Service: Podiatry;  Laterality: Right;  . HERNIA REPAIR    . I&D EXTREMITY Left 06/27/2015   Procedure: Partial Excision Left Calcaneus, Place Antibiotic Beads, and Wound VAC;  Surgeon: Newt Minion, MD;  Location: [place];  Service: Orthopedics;  Laterality: Left;  . KNEE ARTHROSCOPY     right  . LEFT AND RIGHT HEART CATHETERIZATION WITH CORONARY ANGIOGRAM N/A 04/23/2013   Procedure: LEFT AND RIGHT HEART CATHETERIZATION WITH CORONARY ANGIOGRAM;  Surgeon: Leonie Man, MD;  Location: [facility] CATH LAB;  Service: Cardiovascular;  Laterality: N/A;  . LEFT HEART CATH AND CORONARY ANGIOGRAPHY N/A 03/11/2017   Procedure: Left Heart Cath and Coronary Angiography;  Surgeon: Sherren Mocha, MD;  Location: [place] CV LAB; angiographically minimal CAD in the LAD otherwise normal.;  Normal LVEDP.  FALSE POSITIVE MYOVIEW  . LEFT HEART CATH AND CORONARY ANGIOGRAPHY  07/20/2010   LVEF 50-55% WITH VERY MILD GLOBAL  HYPOKINESIA; ESSENTIALLY NORMAL CORONARY ARTERIES; NORMAL LV FUNCTION  . METATARSAL OSTEOTOMY WITH OPEN REDUCTION INTERNAL FIXATION (ORIF) METATARSAL WITH FUSION Left 04/09/2014   Procedure: LEFT FOOT FRACTURE OPEN TREATMENT METATARSAL INCLUDES INTERNAL FIXATION EACH;  Surgeon: Lorn Junes, MD;  Location: [place];  Service: Orthopedics;  Laterality: Left;  . NISSEN FUNDOPLICATION  [id number]  . NM MYOVIEW LTD  03/09/2013   Lexiscan --> EF 50%; NORMAL MYOCARDIAL PERFUSION STUDY - breast attenuation  . NM MYOVIEW LTD  03/10/2017   : Moderate size "stress-induced "perfusion defect at the apex as well as "ill-defined stress-induced perfusion defect in the lateral wall.  EF 55%.  INTERMEDIATE risk. -->  FALSE POSITIVE  . Right and left CARDIAC CATHETERIZATION  04/23/2013   Angiographic normal coronaries; LVEDP 20 mmHg, PCWP 12-14 mmHg, RAP 12 mmHg.; Fick  CO/CI 4.9/2.2  . SHOULDER ARTHROSCOPY Left 03/14/2019   Procedure: LEFT SHOULDER ARTHROSCOPY, DEBRIDEMENT, AND DECOMPRESSION;  Surgeon: Newt Minion, MD;  Location: [place];  Service: Orthopedics;  Laterality: Left;  . TOTAL KNEE ARTHROPLASTY Right 06/29/2017   Procedure: RIGHT TOTAL KNEE ARTHROPLASTY;  Surgeon: Newt Minion, MD;  Location: [place];  Service: Orthopedics;  Laterality: Right;  . TOTAL KNEE ARTHROPLASTY Left 12/07/2017  . TOTAL KNEE ARTHROPLASTY Left 12/07/2017   Procedure: LEFT TOTAL KNEE ARTHROPLASTY;  Surgeon: Newt Minion, MD;  Location: [place];  Service: Orthopedics;  Laterality: Left;  . TRACHEOSTOMY TUBE PLACEMENT N/A 09/20/2017   Procedure: AWAKE INTUBATION WITH ANESTHESIA WITH VIDEO ASSISTANCE;  Surgeon: Helayne Seminole, MD;  Location: [place] [facility];  Service: ENT;  Laterality: N/A;  . TRANSTHORACIC ECHOCARDIOGRAM  08/2014   Normal LV size and function.  Mild LVH.  EF 55-60%.  Normal regional wall motion.  GR 1 DD.  Normal RV size and function .  [name] TUBAL LIGATION     with reversal in 1994  . VENTRAL HERNIA REPAIR       OB History   No obstetric history on file.      Home Medications    Prior to Admission medications   Medication Sig Start Date End Date Taking? Authorizing Provider  albuterol (PROVENTIL HFA;VENTOLIN HFA) 108 (90 Base) MCG/ACT inhaler Inhale 1-2 puffs into the lungs every 6 (six) hours as needed for wheezing or shortness of breath. 01/04/19   [name], MD  allopurinol (ZYLOPRIM) 100 MG tablet Take 100 mg by mouth every evening.     [provider]  aspirin EC 81 MG tablet Take 81 mg by mouth daily.     [provider]  atorvastatin (LIPITOR) 40 MG tablet TAKE 1 TAB BY MOUTH ONCE DAILY Patient taking differently: Take 40 mg by mouth every evening.  08/08/18   [name], MD  BREO ELLIPTA 200-25 MCG/INH AEPB TAKE 1 PUFF BY MOUTH EVERY DAY 06/04/19   [name], MD  buPROPion (WELLBUTRIN XL) 300 MG 24 hr tablet TAKE  1 TABLET BY MOUTH EVERY DAY Patient taking differently: Take 300 mg by mouth daily.  11/27/18   [name], MD  carvedilol (COREG) 25 MG tablet TAKE 1 TABLET (25 MG TOTAL) BY MOUTH 2 (TWO) TIMES DAILY WITH A MEAL. 09/12/18   Leonie Man, MD  cholecalciferol (VITAMIN D) 25 MCG (1000 UT) tablet Take 1,000 Units by mouth 2 (two) times a day.    [provider]  dexlansoprazole (DEXILANT) 60 MG capsule Take 1 capsule (60 mg total) by mouth daily. 06/25/19   Mannam,  Praveen, MD  diltiazem (CARDIZEM CD) 120 MG 24 hr capsule Take 1 capsule (120 mg total) by mouth daily. 06/22/19   Croitoru, Mihai, MD  famotidine (PEPCID) 20 MG tablet Take 1 tablet (20 mg total) by mouth at bedtime. 12/13/18   Willia Craze, NP  fenofibrate 160 MG tablet TAKE 1 TABLET BY MOUTH EVERY DAY 04/20/19   Leonie Man, MD  fluticasone [facility]) 50 MCG/ACT nasal spray PLACE 2 SPRAYS INTO BOTH NOSTRILS DAILY AS NEEDED FOR ALLERGIES 06/07/19   [name], MD  furosemide (LASIX) 40 MG tablet TAKE 1 TABLET BY MOUTH TWICE A DAY 06/22/19   Leonie Man, MD  gabapentin (NEURONTIN) 100 MG capsule TAKE 1 CAPSULE BY MOUTH THREE TIMES A DAY Patient taking differently: Take 100-200 mg by mouth See admin instructions. Take 1 capsule (100 mg) by mouth in the morning & 2 capsules (200 mg) by mouth at night. 12/14/18   [name], MD  glucose blood [facility] VERIO) test strip 1 each by Other route 2 (two) times daily. Use as instructed 02/12/19   Renato Shin, MD  HYDROcodone-acetaminophen (NORCO/VICODIN) 5-325 MG tablet Take 1 tablet by mouth every 6 (six) hours as needed for moderate pain. 06/06/19   Suzan Slick, NP  Insulin Glargine (LANTUS SOLOSTAR) 100 UNIT/ML Solostar Pen Inject 60 Units into the skin every morning. 07/16/19   Renato Shin, MD  insulin lispro (HUMALOG KWIKPEN) 100 UNIT/ML KwikPen Inject 0.3 mLs (30 Units total) into the skin daily with supper. 07/16/19   Renato Shin, MD  Insulin Pen Needle  33G X 4 MM MISC 1 each by Does not apply route 4 (four) times daily. 01/15/16   Philemon Kingdom, MD  isosorbide mononitrate (IMDUR) 30 MG 24 hr tablet Take 1 tablet (30 mg total) by mouth at bedtime. 06/28/19   Leonie Man, MD  KLOR-CON M20 20 MEQ tablet TAKE 1 & 1/2 TABLETS BY MOUTH TWICE A DAY Patient taking differently: Take 30 mEq by mouth 2 (two) times a day.  02/19/19   [name], MD  lactulose (CHRONULAC) 10 GM/15ML solution Take 10 g by mouth daily.  02/25/19   [provider]  linaclotide Rolan Lipa) 290 MCG CAPS capsule Take 1 capsule (290 mcg total) by mouth daily before breakfast. 06/13/19   Irene Shipper, MD  LORazepam (ATIVAN) 0.5 MG tablet Take 0.5 mg by mouth every 12 (twelve) hours as needed for anxiety.    [provider]  metFORMIN (GLUCOPHAGE) 1000 MG tablet Take 1 tablet (1,000 mg total) by mouth 2 (two) times daily with a meal. MUST CALL TO SCHEDULE AN APPT. 07/09/19   Renato Shin, MD  naproxen (NAPROSYN) 500 MG tablet Take 1 tablet (500 mg total) by mouth 2 (two) times daily with a meal. Patient taking differently: Take 500 mg by mouth 2 (two) times daily as needed (pain).  01/30/19   Edrick Kins, DPM  nitroGLYCERIN (NITROSTAT) 0.4 MG SL tablet Place 1 tablet (0.4 mg total) under the tongue every 5 (five) minutes as needed for chest pain. Reported on 01/05/2016 01/10/18   Leonie Man, MD  ondansetron (ZOFRAN-ODT) 4 MG disintegrating tablet Take 1 tablet (4 mg total) by mouth every 8 (eight) hours as needed for nausea or vomiting. 12/13/18   Willia Craze, NP  OneTouch Delica Lancets 99991111 MISC 1 each by Does not apply route 2 (two) times a day. E11.29 02/14/19   Renato Shin, MD  oxyCODONE-acetaminophen (PERCOCET) 5-325 MG  tablet Take 1 tablet by mouth every 6 (six) hours as needed for severe pain. 04/12/19   Edrick Kins, DPM  PARoxetine Mesylate (BRISDELLE) 7.5 MG CAPS Take 7.5 mg by mouth daily.     [provider]  predniSONE (DELTASONE)  5 MG tablet Take 5 mg by mouth daily with breakfast.    [provider]  telmisartan (MICARDIS) 20 MG tablet TAKE 1 TABLET BY MOUTH EVERY DAY 04/04/19   [name], MD  venlafaxine XR (EFFEXOR-XR) 75 MG 24 hr capsule Take 1 capsule (75 mg total) by mouth 2 (two) times daily. Patient taking differently: Take 75 mg by mouth every evening.  12/14/18   [name], MD  mupirocin nasal ointment (BACTROBAN) 2 % Place 1 application into the nose 2 (two) times daily. Use one-half of tube in each nostril twice daily for five (5) days. After application, press sides of nose together and gently massage. 03/28/18 03/28/18  [name], MD    Family History Family History  Problem Relation Age of Onset  . Diabetes Father   . Heart attack Father 13  . Coronary artery disease Father   . Heart failure Father   . Throat cancer Father   . COPD Mother   . Emphysema Mother   . Asthma Mother   . Heart failure Mother   . Breast cancer Mother   . Heart attack Maternal Grandfather   . Sarcoidosis Maternal Uncle   . Lung cancer Brother   . Diabetes Brother   . Colon cancer Neg Hx   . Rectal cancer Neg Hx     Social History Social History   Tobacco Use  . Smoking status: Never Smoker  . Smokeless tobacco: Never Used  Substance Use Topics  . Alcohol use: No    Alcohol/week: 0.0 standard drinks  . Drug use: No     Allergies   Methotrexate, Vancomycin, Lisinopril, Chlorhexidine, Clindamycin/lincomycin, Doxycycline, and Teflaro [ceftaroline]   Review of Systems Review of Systems  Constitutional: Negative.   HENT: Negative.   Respiratory: Negative.   Cardiovascular: Negative.   Gastrointestinal: Negative.   Genitourinary: Negative.   Musculoskeletal: Negative for gait problem, neck pain and neck stiffness.       Left shoulder pain  Skin: Negative.   Neurological: Negative.   All other systems reviewed and are negative.  Physical Exam Updated Vital Signs BP (!)  169/74 (BP Location: Right Arm)   Pulse 88   Temp 98.2 F (36.8 C) (Oral)   Resp 14   Ht 5\' 6"  (1.676 m)   Wt 113.4 kg   SpO2 98%   BMI 40.35 kg/m   Physical Exam Vitals signs and nursing note reviewed.  Constitutional:      General: She is not in acute distress.    Appearance: She is well-developed. She is not ill-appearing, toxic-appearing or diaphoretic.  HENT:     Head: Normocephalic and atraumatic.     Nose: Nose normal.     Mouth/Throat:     Mouth: Mucous membranes are moist.     Pharynx: Oropharynx is clear.  Eyes:     Pupils: Pupils are equal, round, and reactive to light.  Neck:     Musculoskeletal: Normal range of motion.     Comments: No midline cervical neck tenderness. Full ROm without difficulty. Neck supple Cardiovascular:     Rate and Rhythm: Normal rate.     Pulses: Normal pulses.  Radial pulses are 2+ on the right side and 2+ on the left side.     Heart sounds: Normal heart sounds.  Pulmonary:     Effort: Pulmonary effort is normal. No respiratory distress.     Breath sounds: Normal breath sounds.  Abdominal:     General: Bowel sounds are normal. There is no distension.  Musculoskeletal:     Right shoulder: Normal.     Left shoulder: She exhibits decreased range of motion, tenderness and pain. She exhibits no bony tenderness, no swelling, no effusion, no crepitus, no deformity, no laceration, no spasm, normal pulse and normal strength.     Right elbow: Normal.    Left elbow: Normal.     Right wrist: Normal.     Left wrist: Normal.     Left hip: Normal.     Cervical back: Normal.     Thoracic back: Normal.     Lumbar back: Normal.     Left forearm: Normal.       Arms:     Left hand: Normal.     Comments: No pain to midline spinal column.  No step-offs.  Patient with tenderness palpation to anterior and lateral left shoulder.  She is unable to perform ROM secondary to pain.  She does have pain to her proximal humerus however no pain to her  midshaft, distal humerus as well as over her olecranon process is full range of motion to wrist.  Skin:    General: Skin is warm and dry.     Comments: Brisk capillary refill.  Neurological:     General: No focal deficit present.     Mental Status: She is alert.     Cranial Nerves: Cranial nerves are intact.     Gait: Gait is intact.     Comments: Intact sensation to sharp and dull    ED Treatments / Results  Labs (all labs ordered are listed, but only abnormal results are displayed) Labs Reviewed - No data to display  EKG None  Radiology Dg Shoulder Left  Result Date: 07/22/2019 CLINICAL DATA:  Patient arrived from EMS and states she had a fall at church. The patient fell onto her left shoulder and is experiencing pain from the shoulder to the elbow. The patient's vitals were wnl en route and a sling was applied by EMS. EXAM: LEFT HUMERUS - 2+ VIEW; LEFT SHOULDER - 2+ VIEW COMPARISON:  None. FINDINGS: Left shoulder: There is an oblique fracture through the neck of the left proximal humerus with mild displacement. No evidence of dislocation. No acute finding in the adjacent left ribs or lung. Regional soft tissues are unremarkable. Left humerus: No additional acute abnormality in the remainder of the left humerus. Normal articulation at the elbow joint. IMPRESSION: Oblique fracture through the neck of the left proximal humerus with mild displacement. Electronically Signed   By: Audie Pinto M.D.   On: 07/22/2019 15:17   Dg Humerus Left  Result Date: 07/22/2019 CLINICAL DATA:  Patient arrived from EMS and states she had a fall at church. The patient fell onto her left shoulder and is experiencing pain from the shoulder to the elbow. The patient's vitals were wnl en route and a sling was applied by EMS. EXAM: LEFT HUMERUS - 2+ VIEW; LEFT SHOULDER - 2+ VIEW COMPARISON:  None. FINDINGS: Left shoulder: There is an oblique fracture through the neck of the left proximal humerus with mild  displacement. No evidence of dislocation. No acute finding in the  adjacent left ribs or lung. Regional soft tissues are unremarkable. Left humerus: No additional acute abnormality in the remainder of the left humerus. Normal articulation at the elbow joint. IMPRESSION: Oblique fracture through the neck of the left proximal humerus with mild displacement. Electronically Signed   By: Audie Pinto M.D.   On: 07/22/2019 15:17   Procedures Procedures (including critical care time)  Medications Ordered in ED Medications  HYDROmorphone (DILAUDID) injection 1 mg (1 mg Intramuscular Given 07/22/19 1411)   Initial Impression / Assessment and Plan / ED Course  I have reviewed the triage vital signs and the nursing notes.  Pertinent labs & imaging results that were available during my care of the patient were reviewed by me and considered in my medical decision making (see chart for details).  58 year old female appears otherwise well presents for evaluation after mechanical fall with left shoulder pain.  She has tenderness to her left anterior and lateral shoulder.  Decreased range of motion secondary to pain.  She is neurovascularly intact.  Will obtain plain films to rule out fracture, dislocation.  No evidence of compartment syndrome.  Pulses equal bilaterally.  Denies hitting head, LOC or anticoagulation. Do not feel she needs imaging of her head or cervical spine at this time.  X-ray with oblique fracture through the neck of the left proximal humerus with mild displacement. NV intact. Will consult with Dr. Sharol Given group whom she see outpatient for treatment recommendations.  Care transferred to [facility] [place] who will follow up on Ortho consult call with recommendations. She will determine ultimate treatment, plan and disposition.      Final Clinical Impressions(s) / ED Diagnoses   Final diagnoses:  Fracture of humerus neck, left, closed, initial encounter  Fall, initial encounter    ED  Discharge Orders    None       Tyah Acord A, PA-C 07/22/19 [place], DO 07/23/19 1506

## 2019-07-22 NOTE — ED Notes (Signed)
Pt verbalizes understanding of discharge paperwork, follow-up care, and prescriptions. opportunity for questions and answers provided. Pt discharged with discharge paperwork.

## 2019-07-22 NOTE — ED Triage Notes (Signed)
Patient arrived from EMS and states she had a fall at church. The patient fell onto her left shoulder and is experiencing pain from the shoulder to the elbow. The patient's vitals were wnl en route and a sling was applied by EMS. 100 mcg fentanyl was administered with no relief reported by the patient.

## 2019-07-22 NOTE — Discharge Instructions (Addendum)
Please keep splint on and wear brace Follow up with Dr. Sharol Given tomorrow morning for further evaluation  I have prescribed a very short course of pain medication to hold you over until you can see Dr. Sharol Given

## 2019-07-22 NOTE — Progress Notes (Signed)
Orthopedic Tech Progress Note Patient Details:  [name] 11-Mar-1961 [id number] Applied a coaptation splint to patient Ortho Devices Type of Ortho Device: Arm sling, Long arm splint Ortho Device/Splint Location: ule Ortho Device/Splint Interventions: Adjustment, Application, Ordered   Post Interventions Patient Tolerated: Fair Instructions Provided: Care of device, Adjustment of device   Janit Pagan 07/22/2019, 5:45 PM

## 2019-07-22 NOTE — ED Provider Notes (Signed)
Care assumed from [facility], PA-C, at shift change, please see their notes for full documentation of patient's complaint/HPI. Briefly, pt here with left shoulder pain s/p mechanical fall at church this AM. Hx of chronic left shoulder pain; followed by Dr. Sharol Given. Results so far show oblique fracture of left humerus through the neck. Awaiting consult to ortho. Plan is to dispo accordingly.   Physical Exam  BP (!) 169/74 (BP Location: Right Arm)   Pulse 88   Temp 98.2 F (36.8 C) (Oral)   Resp 14   Ht 5\' 6"  (1.676 m)   Wt 113.4 kg   SpO2 98%   BMI 40.35 kg/m   Physical Exam  ED Course/Procedures     Procedures  MDM  Discussed case with Dr. Marlou Sa who recommends coaptation splint, sling, and follow up with Dr. Sharol Given tomorrow morning. Will prescribe short course of pain medication to hold her over until she can see ortho. This was discussed with patient - she is in agreement with plan at this time and stable for discharge home.        Eustaquio Maize, PA-C 07/22/19 1743    Davonna Belling, MD 07/22/19 [phone]

## 2019-07-24 NOTE — Telephone Encounter (Signed)
"  My wife has a surgery scheduled for the end of this month.  She fell and broke her arm last week so I'm calling to cancel her surgery."  I will cancel it.  Thanks for letting us know.  I called Cassie at [employer] and canceled Mrs. Thielen's surgery that was scheduled for August 10, 2019.

## 2019-07-26 ENCOUNTER — Ambulatory Visit (INDEPENDENT_AMBULATORY_CARE_PROVIDER_SITE_OTHER): Payer: 59 | Admitting: Orthopedic Surgery

## 2019-07-26 ENCOUNTER — Other Ambulatory Visit: Payer: Self-pay | Admitting: Endocrinology

## 2019-07-26 ENCOUNTER — Other Ambulatory Visit: Payer: Self-pay

## 2019-07-26 DIAGNOSIS — S42295A Other nondisplaced fracture of upper end of left humerus, initial encounter for closed fracture: Secondary | ICD-10-CM

## 2019-07-26 DIAGNOSIS — Z794 Long term (current) use of insulin: Secondary | ICD-10-CM

## 2019-07-26 DIAGNOSIS — E1129 Type 2 diabetes mellitus with other diabetic kidney complication: Secondary | ICD-10-CM

## 2019-07-26 MED ORDER — OXYCODONE-ACETAMINOPHEN 5-325 MG PO TABS: 1.0000 | ORAL_TABLET | Freq: Four times a day (QID) | ORAL | 0 refills | Status: DC | PRN

## 2019-07-27 ENCOUNTER — Encounter: Payer: Self-pay | Admitting: Orthopedic Surgery

## 2019-07-27 NOTE — Progress Notes (Signed)
Office Visit Note   Patient: [name]           Date of Birth: Jun 17, 1961           MRN: [id number] Visit Date: 07/26/2019              Requested by: [name], MD [place],  [place] [id number] PCP: [name], MD  Chief Complaint  Patient presents with  . Left Shoulder - Pain  . Left Arm - Pain      HPI: Patient is a 58 year old woman who presents with acute proximal left humerus fracture patient states she fell at church on Sunday patient was seen in the emergency room was placed in a splint and was seen today for initial evaluation.  Patient states she has pain from her shoulder all the way down her arm.  Radiographs were performed on October 11.  Assessment & Plan: Visit Diagnoses:  1. Other closed nondisplaced fracture of proximal end of left humerus, initial encounter     Plan: A prescription was called in for Percocet.  Discussed the importance of pendulum exercises range of motion of her elbow wrist and fingers.  Patient is currently sleeping and a reclining chair at follow-up obtain 2 view radiographs of the left shoulder.  Follow-Up Instructions: Return in about 2 weeks (around 08/09/2019).   Ortho Exam  Patient is alert, oriented, no adenopathy, well-dressed, normal affect, normal respiratory effort. Examination patient has ecchymosis of bruising there is no skin defect her hand is neurovascular intact.  Radiographs are reviewed which shows a comminuted fracture of the left proximal humerus minimal displacement.  Imaging: No results found. No images are attached to the encounter.  Labs: Lab Results  Component Value Date   HGBA1C 9.9 (H) 07/04/2019   HGBA1C 8.2 (H) 03/12/2019   HGBA1C 8.2 (A) 08/25/2018   ESRSEDRATE 28 06/16/2017   ESRSEDRATE 5 01/26/2017   ESRSEDRATE 27 10/14/2016   CRP 27.1 (H) 06/16/2017   CRP 17.3 (H) 01/26/2017   CRP 24.0 (H) 10/14/2016   LABURIC 5.6 07/04/2019   LABURIC 5.1 01/26/2017   REPTSTATUS 09/22/2017 FINAL 09/20/2017   GRAMSTAIN  08/14/2017    MODERATE WBC PRESENT, PREDOMINANTLY PMN MODERATE BUDDING YEAST SEEN    CULT NO GROUP A STREP (S.PYOGENES) ISOLATED 09/20/2017   LABORGA PSEUDOMONAS FLUORESCENS 08/14/2017     Lab Results  Component Value Date   ALBUMIN 4.2 07/04/2019   ALBUMIN 4.1 12/04/2018   ALBUMIN 4.4 04/05/2018   LABURIC 5.6 07/04/2019   LABURIC 5.1 01/26/2017    Lab Results  Component Value Date   MG 1.7 09/23/2017   MG 1.9 09/22/2017   MG 1.8 09/21/2017   No results found for: VD25OH  No results found for: PREALBUMIN CBC EXTENDED Latest Ref Rng & Units 07/04/2019 04/10/2019 03/12/2019  WBC 4.0 - 10.5 K/uL 11.4(H) 9.7 9.4  RBC 3.87 - 5.11 Mil/uL 4.01 3.76(L) 3.68(L)  HGB 12.0 - 15.0 g/dL 10.6(L) 10.2(L) 10.0(L)  HCT 36.0 - 46.0 % 33.7(L) 33.3(L) 33.1(L)  PLT 150.0 - 400.0 K/uL 366.0 341 311  NEUTROABS 1.4 - 7.7 K/uL - - -  LYMPHSABS 0.7 - 4.0 K/uL - - -     There is no height or weight on file to calculate BMI.  Orders:  No orders of the defined types were placed in this encounter.  Meds ordered this encounter  Medications  . oxyCODONE-acetaminophen (PERCOCET/ROXICET) 5-325 MG tablet    Sig: Take 1 tablet  by mouth every 6 (six) hours as needed for severe pain.    Dispense:  28 tablet    Refill:  0     Procedures: No procedures performed  Clinical Data: No additional findings.  ROS:  All other systems negative, except as noted in the HPI. Review of Systems  Objective: Vital Signs: There were no vitals taken for this visit.  Specialty Comments:  No specialty comments available.  PMFS History: Patient Active Problem List   Diagnosis Date Noted  . Nontraumatic complete tear of left rotator cuff   . Impingement syndrome of left shoulder   . Diabetic neuropathy ([place]) 02/20/2019  . Severe persistent asthma 01/04/2019  . Subcortical microvascular ischemic occlusive disease 07/12/2018  . Rapid palpitations  07/12/2018  . Impingement of left ankle joint 06/26/2018  . Constipation 04/21/2018  . Anxiety 04/05/2018  . Total knee replacement status, left 12/07/2017  . Dysphagia 09/20/2017  . Epiglottitis   . Plantar fasciitis, right 07/13/2017  . S/P total knee arthroplasty, right 06/29/2017  . Osteoarthrosis, localized, primary, knee, right   . Sprain of calcaneofibular ligament of right ankle 06/06/2017  . Osteoarthritis of right knee 01/26/2017  . Onychomycosis 10/27/2015  . CKD (chronic kidney disease) stage 3, GFR 30-59 ml/min 04/27/2015  . MRSA (methicillin resistant staph aureus) culture positive 03/27/2015  . History of osteomyelitis 03/27/2015  . Fatty liver 09/30/2014  . Hot flashes 07/15/2014  . Abnormal SPEP 04/17/2014  . Fracture of left leg 04/17/2014  . Cushingoid side effect of steroids ([place]) 04/17/2014  . Internal hemorrhoids   . Major depression in full remission ([place])   . Preoperative clearance 03/25/2014  . Obstructive chronic bronchitis without exacerbation ([place]) 09/18/2013  . Chest pain 04/11/2013  . Hypertensive heart disease with chronic diastolic congestive heart failure ([place])   . Solitary pulmonary nodule, on CT 02/2013 - stable over 2 years in 2015 02/20/2013  . Gout 08/20/2010  . Anemia 09/18/2009  . Morbid obesity ([place]) 06/04/2009  . Sleep apnea 04/21/2009  . Sarcoidosis of lung ([place]) 04/10/2007  . Hyperlipidemia associated with type 2 diabetes mellitus ([place]) 08/21/2006  . Hypertension associated with diabetes ([place]) 08/21/2006  . GERD 08/21/2006  . Type II diabetes mellitus with renal manifestations ([place]) 08/21/2006   Past Medical History:  Diagnosis Date  . Abnormal SPEP 04/17/2014  . Acute left ankle pain 01/26/2017  . ANEMIA-UNSPECIFIED 09/18/2009  . Anxiety   . Arthritis   . Chronic diastolic heart failure, NYHA class 2 (HCC)    Normal LVEDP by May 2018  . COPD (chronic obstructive pulmonary disease) ([place])   . Depression   . DIABETES MELLITUS, TYPE  II 08/21/2006  . Diabetic osteomyelitis ([place]) 05/29/2015  . Fracture of 5th metatarsal    non union  . GERD [id number]   Had fundoplication  . GOUT 08/20/2010  . Hammer toe of right foot    2-5th toes  . Hx of umbilical hernia repair   . HYPERLIPIDEMIA 08/21/2006  . HYPERTENSION 08/21/2006  . Infection of wound due to methicillin resistant Staphylococcus aureus (MRSA)   . Internal hemorrhoids   . Multiple allergies 10/14/2016  . OBESITY 06/04/2009  . Onychomycosis 10/27/2015  . Osteomyelitis of left foot ([place]) 05/29/2015  . Pulmonary sarcoidosis ([place])    Followed locally by pulmonology, but also by Dr. Casper Harrison at [facility]  . Right knee pain 01/26/2017  . Vocal cord dysfunction   . Wears partial dentures     Family History  Problem Relation  Age of Onset  . Diabetes Father   . Heart attack Father 66  . Coronary artery disease Father   . Heart failure Father   . Throat cancer Father   . COPD Mother   . Emphysema Mother   . Asthma Mother   . Heart failure Mother   . Breast cancer Mother   . Heart attack Maternal Grandfather   . Sarcoidosis Maternal Uncle   . Lung cancer Brother   . Diabetes Brother   . Colon cancer Neg Hx   . Rectal cancer Neg Hx     Past Surgical History:  Procedure Laterality Date  . ABDOMINAL HYSTERECTOMY    . APPENDECTOMY    . BLADDER SUSPENSION  11/11/2011   Procedure: TRANSVAGINAL TAPE (TVT) PROCEDURE;  Surgeon: Olga Millers, MD;  Location: [facility];  Service: Gynecology;  Laterality: N/A;  . CAROTIDS  02/18/11   CAROTID DUPLEX; VERTEBRALS ARE PATENT WITH ANTEGRADE FLOW. ICA/CCA RATIO 1.61 ON RIGHT AND 0.75 ON LEFT  . CHOLECYSTECTOMY  1984  . CYSTOSCOPY  11/11/2011   Procedure: CYSTOSCOPY;  Surgeon: Olga Millers, MD;  Location: [facility];  Service: Gynecology;  Laterality: N/A;  . EXTUBATION (ENDOTRACHEAL) IN OR N/A 09/23/2017   Procedure: EXTUBATION (ENDOTRACHEAL) IN OR;  Surgeon: Helayne Seminole, MD;  Location: [facility];  Service:  ENT;  Laterality: N/A;  . FIBEROPTIC LARYNGOSCOPY AND TRACHEOSCOPY N/A 09/23/2017   Procedure: FLEXIBLE FIBEROPTIC LARYNGOSCOPY;  Surgeon: Helayne Seminole, MD;  Location: [facility];  Service: ENT;  Laterality: N/A;  . FRACTURE SURGERY     foot  . HALLUX FUSION Left 10/02/2018   Procedure: HALLUX ARTHRODESIS;  Surgeon: Edrick Kins, DPM;  [facility];  Service: Podiatry;  Laterality: Left;  . HAMMER TOE SURGERY Left 10/02/2018   Procedure: HAMMER TOE CORRECTION 2ND 3RD 4RD FIFTH TOE;  Surgeon: Edrick Kins, DPM;  [facility];  Service: Podiatry;  Laterality: Left;  . HAMMER TOE SURGERY Right 04/12/2019   Procedure: HAMMER TOE CORRECTION, 2ND, 3RD, 4TH AND 5TH TOES OF RIGHT FOOT;  Surgeon: Edrick Kins, DPM;  Location: [facility];  Service: Podiatry;  Laterality: Right;  . HERNIA REPAIR    . I&D EXTREMITY Left 06/27/2015   Procedure: Partial Excision Left Calcaneus, Place Antibiotic Beads, and Wound VAC;  Surgeon: Newt Minion, MD;  Location: [facility];  Service: Orthopedics;  Laterality: Left;  . KNEE ARTHROSCOPY     right  . LEFT AND RIGHT HEART CATHETERIZATION WITH CORONARY ANGIOGRAM N/A 04/23/2013   Procedure: LEFT AND RIGHT HEART CATHETERIZATION WITH CORONARY ANGIOGRAM;  Surgeon: Leonie Man, MD;  Location: [facility];  Service: Cardiovascular;  Laterality: N/A;  . LEFT HEART CATH AND CORONARY ANGIOGRAPHY N/A 03/11/2017   Procedure: Left Heart Cath and Coronary Angiography;  Surgeon: Sherren Mocha, MD;  Location: [facility]; angiographically minimal CAD in the LAD otherwise normal.;  Normal LVEDP.  FALSE POSITIVE MYOVIEW  . LEFT HEART CATH AND CORONARY ANGIOGRAPHY  07/20/2010   LVEF 50-55% WITH VERY MILD GLOBAL HYPOKINESIA; ESSENTIALLY NORMAL CORONARY ARTERIES; NORMAL LV FUNCTION  . METATARSAL OSTEOTOMY WITH OPEN REDUCTION INTERNAL FIXATION (ORIF) METATARSAL WITH FUSION Left 04/09/2014   Procedure: LEFT FOOT FRACTURE OPEN TREATMENT METATARSAL INCLUDES INTERNAL FIXATION  EACH;  Surgeon: Lorn Junes, MD;  Location: [facility];  Service: Orthopedics;  Laterality: Left;  . NISSEN FUNDOPLICATION  123XX123  . NM MYOVIEW LTD  03/09/2013   Lexiscan --> EF 50%; NORMAL  MYOCARDIAL PERFUSION STUDY - breast attenuation  . NM MYOVIEW LTD  03/10/2017   : Moderate size "stress-induced "perfusion defect at the apex as well as "ill-defined stress-induced perfusion defect in the lateral wall.  EF 55%.  INTERMEDIATE risk. -->  FALSE POSITIVE  . Right and left CARDIAC CATHETERIZATION  04/23/2013   Angiographic normal coronaries; LVEDP 20 mmHg, PCWP 12-14 mmHg, RAP 12 mmHg.; Fick CO/CI 4.9/2.2  . SHOULDER ARTHROSCOPY Left 03/14/2019   Procedure: LEFT SHOULDER ARTHROSCOPY, DEBRIDEMENT, AND DECOMPRESSION;  Surgeon: Newt Minion, MD;  Location: [facility];  Service: Orthopedics;  Laterality: Left;  . TOTAL KNEE ARTHROPLASTY Right 06/29/2017   Procedure: RIGHT TOTAL KNEE ARTHROPLASTY;  Surgeon: Newt Minion, MD;  Location: [facility];  Service: Orthopedics;  Laterality: Right;  . TOTAL KNEE ARTHROPLASTY Left 12/07/2017  . TOTAL KNEE ARTHROPLASTY Left 12/07/2017   Procedure: LEFT TOTAL KNEE ARTHROPLASTY;  Surgeon: Newt Minion, MD;  Location: [facility];  Service: Orthopedics;  Laterality: Left;  . TRACHEOSTOMY TUBE PLACEMENT N/A 09/20/2017   Procedure: AWAKE INTUBATION WITH ANESTHESIA WITH VIDEO ASSISTANCE;  Surgeon: Helayne Seminole, MD;  Location: [facility];  Service: ENT;  Laterality: N/A;  . TRANSTHORACIC ECHOCARDIOGRAM  08/2014   Normal LV size and function.  Mild LVH.  EF 55-60%.  Normal regional wall motion.  GR 1 DD.  Normal RV size and function .  [name] TUBAL LIGATION     with reversal in 1994  . VENTRAL HERNIA REPAIR     Social History   Occupational History  . Occupation: DISABLED  Tobacco Use  . Smoking status: Never Smoker  . Smokeless tobacco: Never Used  Substance and Sexual Activity  . Alcohol use: No    Alcohol/week: 0.0 standard drinks  . Drug use: No  .  Sexual activity: Yes    Birth control/protection: Surgical

## 2019-07-30 ENCOUNTER — Other Ambulatory Visit: Payer: 59

## 2019-08-03 ENCOUNTER — Other Ambulatory Visit: Payer: Self-pay | Admitting: Endocrinology

## 2019-08-03 DIAGNOSIS — Z794 Long term (current) use of insulin: Secondary | ICD-10-CM

## 2019-08-03 DIAGNOSIS — E1129 Type 2 diabetes mellitus with other diabetic kidney complication: Secondary | ICD-10-CM

## 2019-08-09 ENCOUNTER — Encounter: Payer: Self-pay | Admitting: Orthopedic Surgery

## 2019-08-09 ENCOUNTER — Other Ambulatory Visit: Payer: Self-pay

## 2019-08-09 ENCOUNTER — Ambulatory Visit: Payer: 59

## 2019-08-09 ENCOUNTER — Ambulatory Visit (INDEPENDENT_AMBULATORY_CARE_PROVIDER_SITE_OTHER): Payer: 59 | Admitting: Orthopedic Surgery

## 2019-08-09 VITALS — Ht 66.0 in | Wt 250.0 lb

## 2019-08-09 DIAGNOSIS — S42295A Other nondisplaced fracture of upper end of left humerus, initial encounter for closed fracture: Secondary | ICD-10-CM

## 2019-08-09 NOTE — Progress Notes (Signed)
Office Visit Note   Patient: [name]           Date of Birth: Sep 01, 1961           MRN: [id number] Visit Date: 08/09/2019              Requested by: [name], MD [place],  [place] [id number] PCP: [name], MD  Chief Complaint  Patient presents with  . Left Shoulder - Follow-up    Proximal humeral fx DOI 07/22/19      HPI: Patient is a 58 year old woman who is 2 weeks status post proximal left humerus fracture with minimal displacement.  Patient is currently in the sling she states he still has some lateral shoulder pain but feels like she is improving.  Assessment & Plan: Visit Diagnoses:  1. Other closed nondisplaced fracture of proximal end of left humerus, initial encounter     Plan: We will set patient up for physical therapy begin range of motion and strengthening.  She will wean out of the sling.  Follow-Up Instructions: Return in about 4 weeks (around 09/06/2019).   Ortho Exam  Patient is alert, oriented, no adenopathy, well-dressed, normal affect, normal respiratory effort. Examination patient's left upper extremity is neurovascular intact there is no skin breakdown.  Imaging: Xr Shoulder Left  Result Date: 08/09/2019 2 view radiographs of the left shoulder shows stable alignment of the proximal humerus fracture no further displacement.  No images are attached to the encounter.  Labs: Lab Results  Component Value Date   HGBA1C 9.9 (H) 07/04/2019   HGBA1C 8.2 (H) 03/12/2019   HGBA1C 8.2 (A) 08/25/2018   ESRSEDRATE 28 06/16/2017   ESRSEDRATE 5 01/26/2017   ESRSEDRATE 27 10/14/2016   CRP 27.1 (H) 06/16/2017   CRP 17.3 (H) 01/26/2017   CRP 24.0 (H) 10/14/2016   LABURIC 5.6 07/04/2019   LABURIC 5.1 01/26/2017   REPTSTATUS 09/22/2017 FINAL 09/20/2017   GRAMSTAIN  08/14/2017    MODERATE WBC PRESENT, PREDOMINANTLY PMN MODERATE BUDDING YEAST SEEN    CULT NO GROUP A STREP (S.PYOGENES) ISOLATED 09/20/2017   LABORGA PSEUDOMONAS FLUORESCENS 08/14/2017     Lab Results  Component Value Date   ALBUMIN 4.2 07/04/2019   ALBUMIN 4.1 12/04/2018   ALBUMIN 4.4 04/05/2018   LABURIC 5.6 07/04/2019   LABURIC 5.1 01/26/2017    Lab Results  Component Value Date   MG 1.7 09/23/2017   MG 1.9 09/22/2017   MG 1.8 09/21/2017   No results found for: VD25OH  No results found for: PREALBUMIN CBC EXTENDED Latest Ref Rng & Units 07/04/2019 04/10/2019 03/12/2019  WBC 4.0 - 10.5 K/uL 11.4(H) 9.7 9.4  RBC 3.87 - 5.11 Mil/uL 4.01 3.76(L) 3.68(L)  HGB 12.0 - 15.0 g/dL 10.6(L) 10.2(L) 10.0(L)  HCT 36.0 - 46.0 % 33.7(L) 33.3(L) 33.1(L)  PLT 150.0 - 400.0 K/uL 366.0 341 311  NEUTROABS 1.4 - 7.7 K/uL - - -  LYMPHSABS 0.7 - 4.0 K/uL - - -     Body mass index is 40.35 kg/m.  Orders:  Orders Placed This Encounter  Procedures  . XR Shoulder Left   No orders of the defined types were placed in this encounter.    Procedures: No procedures performed  Clinical Data: No additional findings.  ROS:  All other systems negative, except as noted in the HPI. Review of Systems  Objective: Vital Signs: Ht 5\' 6"  (1.676 m)   Wt 250 lb (113.4 kg)   BMI 40.35  kg/m   Specialty Comments:  No specialty comments available.  PMFS History: Patient Active Problem List   Diagnosis Date Noted  . Nontraumatic complete tear of left rotator cuff   . Impingement syndrome of left shoulder   . Diabetic neuropathy ([place]) 02/20/2019  . Severe persistent asthma 01/04/2019  . Subcortical microvascular ischemic occlusive disease 07/12/2018  . Rapid palpitations 07/12/2018  . Impingement of left ankle joint 06/26/2018  . Constipation 04/21/2018  . Anxiety 04/05/2018  . Total knee replacement status, left 12/07/2017  . Dysphagia 09/20/2017  . Epiglottitis   . Plantar fasciitis, right 07/13/2017  . S/P total knee arthroplasty, right 06/29/2017  . Osteoarthrosis, localized, primary, knee, right   . Sprain of  calcaneofibular ligament of right ankle 06/06/2017  . Osteoarthritis of right knee 01/26/2017  . Onychomycosis 10/27/2015  . CKD (chronic kidney disease) stage 3, GFR 30-59 ml/min 04/27/2015  . MRSA (methicillin resistant staph aureus) culture positive 03/27/2015  . History of osteomyelitis 03/27/2015  . Fatty liver 09/30/2014  . Hot flashes 07/15/2014  . Abnormal SPEP 04/17/2014  . Fracture of left leg 04/17/2014  . Cushingoid side effect of steroids ([place]) 04/17/2014  . Internal hemorrhoids   . Major depression in full remission ([place])   . Preoperative clearance 03/25/2014  . Obstructive chronic bronchitis without exacerbation ([place]) 09/18/2013  . Chest pain 04/11/2013  . Hypertensive heart disease with chronic diastolic congestive heart failure ([place])   . Solitary pulmonary nodule, on CT 02/2013 - stable over 2 years in 2015 02/20/2013  . Gout 08/20/2010  . Anemia 09/18/2009  . Morbid obesity ([place]) 06/04/2009  . Sleep apnea 04/21/2009  . Sarcoidosis of lung ([place]) 04/10/2007  . Hyperlipidemia associated with type 2 diabetes mellitus ([place]) 08/21/2006  . Hypertension associated with diabetes ([place]) 08/21/2006  . GERD 08/21/2006  . Type II diabetes mellitus with renal manifestations ([place]) 08/21/2006   Past Medical History:  Diagnosis Date  . Abnormal SPEP 04/17/2014  . Acute left ankle pain 01/26/2017  . ANEMIA-UNSPECIFIED 09/18/2009  . Anxiety   . Arthritis   . Chronic diastolic heart failure, NYHA class 2 (HCC)    Normal LVEDP by May 2018  . COPD (chronic obstructive pulmonary disease) ([place])   . Depression   . DIABETES MELLITUS, TYPE II 08/21/2006  . Diabetic osteomyelitis ([place]) 05/29/2015  . Fracture of 5th metatarsal    non union  . GERD [id number]   Had fundoplication  . GOUT 08/20/2010  . Hammer toe of right foot    2-5th toes  . Hx of umbilical hernia repair   . HYPERLIPIDEMIA 08/21/2006  . HYPERTENSION 08/21/2006  . Infection of wound due to methicillin resistant  Staphylococcus aureus (MRSA)   . Internal hemorrhoids   . Multiple allergies 10/14/2016  . OBESITY 06/04/2009  . Onychomycosis 10/27/2015  . Osteomyelitis of left foot ([place]) 05/29/2015  . Pulmonary sarcoidosis ([place])    Followed locally by pulmonology, but also by Dr. Casper Harrison at [facility]  . Right knee pain 01/26/2017  . Vocal cord dysfunction   . Wears partial dentures     Family History  Problem Relation Age of Onset  . Diabetes Father   . Heart attack Father 43  . Coronary artery disease Father   . Heart failure Father   . Throat cancer Father   . COPD Mother   . Emphysema Mother   . Asthma Mother   . Heart failure Mother   . Breast cancer Mother   . Heart attack  Maternal Grandfather   . Sarcoidosis Maternal Uncle   . Lung cancer Brother   . Diabetes Brother   . Colon cancer Neg Hx   . Rectal cancer Neg Hx     Past Surgical History:  Procedure Laterality Date  . ABDOMINAL HYSTERECTOMY    . APPENDECTOMY    . BLADDER SUSPENSION  11/11/2011   Procedure: TRANSVAGINAL TAPE (TVT) PROCEDURE;  Surgeon: Olga Millers, MD;  Location: [facility];  Service: Gynecology;  Laterality: N/A;  . CAROTIDS  02/18/11   CAROTID DUPLEX; VERTEBRALS ARE PATENT WITH ANTEGRADE FLOW. ICA/CCA RATIO 1.61 ON RIGHT AND 0.75 ON LEFT  . CHOLECYSTECTOMY  1984  . CYSTOSCOPY  11/11/2011   Procedure: CYSTOSCOPY;  Surgeon: Olga Millers, MD;  Location: [facility];  Service: Gynecology;  Laterality: N/A;  . EXTUBATION (ENDOTRACHEAL) IN OR N/A 09/23/2017   Procedure: EXTUBATION (ENDOTRACHEAL) IN OR;  Surgeon: Helayne Seminole, MD;  Location: [facility];  Service: ENT;  Laterality: N/A;  . FIBEROPTIC LARYNGOSCOPY AND TRACHEOSCOPY N/A 09/23/2017   Procedure: FLEXIBLE FIBEROPTIC LARYNGOSCOPY;  Surgeon: Helayne Seminole, MD;  Location: [facility];  Service: ENT;  Laterality: N/A;  . FRACTURE SURGERY     foot  . HALLUX FUSION Left 10/02/2018   Procedure: HALLUX ARTHRODESIS;  Surgeon: Edrick Kins, DPM;   [facility];  Service: Podiatry;  Laterality: Left;  . HAMMER TOE SURGERY Left 10/02/2018   Procedure: HAMMER TOE CORRECTION 2ND 3RD 4RD FIFTH TOE;  Surgeon: Edrick Kins, DPM;  [facility];  Service: Podiatry;  Laterality: Left;  . HAMMER TOE SURGERY Right 04/12/2019   Procedure: HAMMER TOE CORRECTION, 2ND, 3RD, 4TH AND 5TH TOES OF RIGHT FOOT;  Surgeon: Edrick Kins, DPM;  Location: [facility];  Service: Podiatry;  Laterality: Right;  . HERNIA REPAIR    . I&D EXTREMITY Left 06/27/2015   Procedure: Partial Excision Left Calcaneus, Place Antibiotic Beads, and Wound VAC;  Surgeon: Newt Minion, MD;  Location: [facility];  Service: Orthopedics;  Laterality: Left;  . KNEE ARTHROSCOPY     right  . LEFT AND RIGHT HEART CATHETERIZATION WITH CORONARY ANGIOGRAM N/A 04/23/2013   Procedure: LEFT AND RIGHT HEART CATHETERIZATION WITH CORONARY ANGIOGRAM;  Surgeon: Leonie Man, MD;  Location: [facility];  Service: Cardiovascular;  Laterality: N/A;  . LEFT HEART CATH AND CORONARY ANGIOGRAPHY N/A 03/11/2017   Procedure: Left Heart Cath and Coronary Angiography;  Surgeon: Sherren Mocha, MD;  Location: [facility]; angiographically minimal CAD in the LAD otherwise normal.;  Normal LVEDP.  FALSE POSITIVE MYOVIEW  . LEFT HEART CATH AND CORONARY ANGIOGRAPHY  07/20/2010   LVEF 50-55% WITH VERY MILD GLOBAL HYPOKINESIA; ESSENTIALLY NORMAL CORONARY ARTERIES; NORMAL LV FUNCTION  . METATARSAL OSTEOTOMY WITH OPEN REDUCTION INTERNAL FIXATION (ORIF) METATARSAL WITH FUSION Left 04/09/2014   Procedure: LEFT FOOT FRACTURE OPEN TREATMENT METATARSAL INCLUDES INTERNAL FIXATION EACH;  Surgeon: Lorn Junes, MD;  Location: [facility];  Service: Orthopedics;  Laterality: Left;  . NISSEN FUNDOPLICATION  123XX123  . NM MYOVIEW LTD  03/09/2013   Lexiscan --> EF 50%; NORMAL MYOCARDIAL PERFUSION STUDY - breast attenuation  . NM MYOVIEW LTD  03/10/2017   : Moderate size "stress-induced "perfusion defect at the  apex as well as "ill-defined stress-induced perfusion defect in the lateral wall.  EF 55%.  INTERMEDIATE risk. -->  FALSE POSITIVE  . Right and left CARDIAC CATHETERIZATION  04/23/2013   Angiographic normal coronaries; LVEDP 20 mmHg, PCWP 12-14  mmHg, RAP 12 mmHg.; Fick CO/CI 4.9/2.2  . SHOULDER ARTHROSCOPY Left 03/14/2019   Procedure: LEFT SHOULDER ARTHROSCOPY, DEBRIDEMENT, AND DECOMPRESSION;  Surgeon: Newt Minion, MD;  Location: [facility];  Service: Orthopedics;  Laterality: Left;  . TOTAL KNEE ARTHROPLASTY Right 06/29/2017   Procedure: RIGHT TOTAL KNEE ARTHROPLASTY;  Surgeon: Newt Minion, MD;  Location: [facility];  Service: Orthopedics;  Laterality: Right;  . TOTAL KNEE ARTHROPLASTY Left 12/07/2017  . TOTAL KNEE ARTHROPLASTY Left 12/07/2017   Procedure: LEFT TOTAL KNEE ARTHROPLASTY;  Surgeon: Newt Minion, MD;  Location: [facility];  Service: Orthopedics;  Laterality: Left;  . TRACHEOSTOMY TUBE PLACEMENT N/A 09/20/2017   Procedure: AWAKE INTUBATION WITH ANESTHESIA WITH VIDEO ASSISTANCE;  Surgeon: Helayne Seminole, MD;  Location: [facility];  Service: ENT;  Laterality: N/A;  . TRANSTHORACIC ECHOCARDIOGRAM  08/2014   Normal LV size and function.  Mild LVH.  EF 55-60%.  Normal regional wall motion.  GR 1 DD.  Normal RV size and function .  [name] TUBAL LIGATION     with reversal in 1994  . VENTRAL HERNIA REPAIR     Social History   Occupational History  . Occupation: DISABLED  Tobacco Use  . Smoking status: Never Smoker  . Smokeless tobacco: Never Used  Substance and Sexual Activity  . Alcohol use: No    Alcohol/week: 0.0 standard drinks  . Drug use: No  . Sexual activity: Yes    Birth control/protection: Surgical

## 2019-08-10 ENCOUNTER — Encounter (HOSPITAL_BASED_OUTPATIENT_CLINIC_OR_DEPARTMENT_OTHER): Payer: Self-pay

## 2019-08-10 ENCOUNTER — Ambulatory Visit (HOSPITAL_BASED_OUTPATIENT_CLINIC_OR_DEPARTMENT_OTHER): Admit: 2019-08-10 | Payer: 59 | Admitting: Podiatry

## 2019-08-10 SURGERY — CORRECTION, HAMMER TOE
Anesthesia: General | Site: Toe | Laterality: Left

## 2019-08-13 ENCOUNTER — Ambulatory Visit: Payer: Medicare Other | Admitting: Cardiology

## 2019-08-14 ENCOUNTER — Ambulatory Visit (INDEPENDENT_AMBULATORY_CARE_PROVIDER_SITE_OTHER): Payer: 59 | Admitting: Physical Therapy

## 2019-08-14 ENCOUNTER — Encounter: Payer: Self-pay | Admitting: Physical Therapy

## 2019-08-14 ENCOUNTER — Other Ambulatory Visit: Payer: Self-pay

## 2019-08-14 DIAGNOSIS — R293 Abnormal posture: Secondary | ICD-10-CM | POA: Diagnosis not present

## 2019-08-14 DIAGNOSIS — M25512 Pain in left shoulder: Secondary | ICD-10-CM | POA: Diagnosis not present

## 2019-08-14 DIAGNOSIS — M25612 Stiffness of left shoulder, not elsewhere classified: Secondary | ICD-10-CM

## 2019-08-14 NOTE — Patient Instructions (Signed)
Access Code: [email or web address]  URL: [email or web address]  Date: 08/14/2019  Prepared by: Faustino Congress   Exercises  Seated Gripping Towel - 10 reps - 1 sets - 5 sec hold - 2x daily - 7x weekly  Wrist AROM Wrist Circumduction - 10 reps - 1 sets - 2x daily - 7x weekly  Standing Elbow Flexion Extension AROM - 10 reps - 1 sets - 2x daily - 7x weekly  Circular Shoulder Pendulum with Table Support - 10 reps - 1 sets - 2x daily - 7x weekly  Flexion-Extension Shoulder Pendulum with Table Support - 10 reps - 1 sets - 2x daily - 7x weekly  Horizontal Shoulder Pendulum with Table Support - 10 reps - 1 sets - 2x daily - 7x weekly

## 2019-08-14 NOTE — Therapy (Signed)
[facility] [address] [place], [name], [address] Phone: [phone]   Fax:  [phone]  Physical Therapy Evaluation  Patient Details  Name: [name] MRN: [id number] Date of Birth: [birth year] Referring Provider (PT): Dr. Meridee Score   Encounter Date: 08/14/2019  PT End of Session - 08/14/19 0954    Visit Number  1    Number of Visits  12    Date for PT Re-Evaluation  09/25/19    PT Start Time  0925    PT Stop Time  1002    PT Time Calculation (min)  37 min    Activity Tolerance  Patient tolerated treatment well    Behavior During Therapy  [facility] for tasks assessed/performed       Past Medical History:  Diagnosis Date  . Abnormal SPEP 04/17/2014  . Acute left ankle pain 01/26/2017  . ANEMIA-UNSPECIFIED 09/18/2009  . Anxiety   . Arthritis   . Chronic diastolic heart failure, NYHA class 2 (HCC)    Normal LVEDP by May 2018  . COPD (chronic obstructive pulmonary disease) ([place])   . Depression   . DIABETES MELLITUS, TYPE II 08/21/2006  . Diabetic osteomyelitis ([place]) 05/29/2015  . Fracture of 5th metatarsal    non union  . GERD [id number]   Had fundoplication  . GOUT 08/20/2010  . Hammer toe of right foot    2-5th toes  . Hx of umbilical hernia repair   . HYPERLIPIDEMIA 08/21/2006  . HYPERTENSION 08/21/2006  . Infection of wound due to methicillin resistant Staphylococcus aureus (MRSA)   . Internal hemorrhoids   . Multiple allergies 10/14/2016  . OBESITY 06/04/2009  . Onychomycosis 10/27/2015  . Osteomyelitis of left foot ([place]) 05/29/2015  . Pulmonary sarcoidosis ([place])    Followed locally by pulmonology, but also by Dr. Casper Harrison at [facility]  . Right knee pain 01/26/2017  . Vocal cord dysfunction   . Wears partial dentures     Past Surgical History:  Procedure Laterality Date  . ABDOMINAL HYSTERECTOMY    . APPENDECTOMY    . BLADDER SUSPENSION  11/11/2011   Procedure: TRANSVAGINAL TAPE (TVT) PROCEDURE;  Surgeon:  Olga Millers, MD;  Location: [facility];  Service: Gynecology;  Laterality: N/A;  . CAROTIDS  02/18/11   CAROTID DUPLEX; VERTEBRALS ARE PATENT WITH ANTEGRADE FLOW. ICA/CCA RATIO 1.61 ON RIGHT AND 0.75 ON LEFT  . CHOLECYSTECTOMY  1984  . CYSTOSCOPY  11/11/2011   Procedure: CYSTOSCOPY;  Surgeon: Olga Millers, MD;  Location: [facility];  Service: Gynecology;  Laterality: N/A;  . EXTUBATION (ENDOTRACHEAL) IN OR N/A 09/23/2017   Procedure: EXTUBATION (ENDOTRACHEAL) IN OR;  Surgeon: Helayne Seminole, MD;  Location: [facility];  Service: ENT;  Laterality: N/A;  . FIBEROPTIC LARYNGOSCOPY AND TRACHEOSCOPY N/A 09/23/2017   Procedure: FLEXIBLE FIBEROPTIC LARYNGOSCOPY;  Surgeon: Helayne Seminole, MD;  Location: [place];  Service: ENT;  Laterality: N/A;  . FRACTURE SURGERY     foot  . HALLUX FUSION Left 10/02/2018   Procedure: HALLUX ARTHRODESIS;  Surgeon: Edrick Kins, DPM;  Location: [facility];  Service: Podiatry;  Laterality: Left;  . HAMMER TOE SURGERY Left 10/02/2018   Procedure: HAMMER TOE CORRECTION 2ND 3RD 4RD FIFTH TOE;  Surgeon: Edrick Kins, DPM;  Location: [facility];  Service: Podiatry;  Laterality: Left;  . HAMMER TOE SURGERY Right 04/12/2019   Procedure: HAMMER TOE CORRECTION, 2ND, 3RD, 4TH AND 5TH TOES OF RIGHT FOOT;  Surgeon: Edrick Kins, DPM;  Location: [place];  Service: Podiatry;  Laterality: Right;  . HERNIA REPAIR    . I&D EXTREMITY Left 06/27/2015   Procedure: Partial Excision Left Calcaneus, Place Antibiotic Beads, and Wound VAC;  Surgeon: Newt Minion, MD;  Location: [place];  Service: Orthopedics;  Laterality: Left;  . KNEE ARTHROSCOPY     right  . LEFT AND RIGHT HEART CATHETERIZATION WITH CORONARY ANGIOGRAM N/A 04/23/2013   Procedure: LEFT AND RIGHT HEART CATHETERIZATION WITH CORONARY ANGIOGRAM;  Surgeon: Leonie Man, MD;  Location: [facility];  Service: Cardiovascular;  Laterality: N/A;  . LEFT HEART CATH AND CORONARY ANGIOGRAPHY N/A 03/11/2017   Procedure: Left Heart Cath and  Coronary Angiography;  Surgeon: Sherren Mocha, MD;  Location: [place]; angiographically minimal CAD in the LAD otherwise normal.;  Normal LVEDP.  FALSE POSITIVE MYOVIEW  . LEFT HEART CATH AND CORONARY ANGIOGRAPHY  07/20/2010   LVEF 50-55% WITH VERY MILD GLOBAL HYPOKINESIA; ESSENTIALLY NORMAL CORONARY ARTERIES; NORMAL LV FUNCTION  . METATARSAL OSTEOTOMY WITH OPEN REDUCTION INTERNAL FIXATION (ORIF) METATARSAL WITH FUSION Left 04/09/2014   Procedure: LEFT FOOT FRACTURE OPEN TREATMENT METATARSAL INCLUDES INTERNAL FIXATION EACH;  Surgeon: Lorn Junes, MD;  Location: [facility];  Service: Orthopedics;  Laterality: Left;  . NISSEN FUNDOPLICATION  [id number]  . NM MYOVIEW LTD  03/09/2013   Lexiscan --> EF 50%; NORMAL MYOCARDIAL PERFUSION STUDY - breast attenuation  . NM MYOVIEW LTD  03/10/2017   : Moderate size "stress-induced "perfusion defect at the apex as well as "ill-defined stress-induced perfusion defect in the lateral wall.  EF 55%.  INTERMEDIATE risk. -->  FALSE POSITIVE  . Right and left CARDIAC CATHETERIZATION  04/23/2013   Angiographic normal coronaries; LVEDP 20 mmHg, PCWP 12-14 mmHg, RAP 12 mmHg.; Fick CO/CI 4.9/2.2  . SHOULDER ARTHROSCOPY Left 03/14/2019   Procedure: LEFT SHOULDER ARTHROSCOPY, DEBRIDEMENT, AND DECOMPRESSION;  Surgeon: Newt Minion, MD;  Location: [place];  Service: Orthopedics;  Laterality: Left;  . TOTAL KNEE ARTHROPLASTY Right 06/29/2017   Procedure: RIGHT TOTAL KNEE ARTHROPLASTY;  Surgeon: Newt Minion, MD;  Location: [place];  Service: Orthopedics;  Laterality: Right;  . TOTAL KNEE ARTHROPLASTY Left 12/07/2017  . TOTAL KNEE ARTHROPLASTY Left 12/07/2017   Procedure: LEFT TOTAL KNEE ARTHROPLASTY;  Surgeon: Newt Minion, MD;  Location: [place];  Service: Orthopedics;  Laterality: Left;  . TRACHEOSTOMY TUBE PLACEMENT N/A 09/20/2017   Procedure: AWAKE INTUBATION WITH ANESTHESIA WITH VIDEO ASSISTANCE;  Surgeon: Helayne Seminole, MD;  Location: [facility];   Service: ENT;  Laterality: N/A;  . TRANSTHORACIC ECHOCARDIOGRAM  08/2014   Normal LV size and function.  Mild LVH.  EF 55-60%.  Normal regional wall motion.  GR 1 DD.  Normal RV size and function .  [name] TUBAL LIGATION     with reversal in 1994  . VENTRAL HERNIA REPAIR      There were no vitals filed for this visit.   Subjective Assessment - 08/14/19 0926    Subjective  [name] a 58 y/o female who presents to OPPT for Lt humerus fx, s/p fall due to tripping over boxes on 07/22/2019.  Pt presents today in sling and still sleeping in sling.    Limitations  Lifting    Patient Stated Goals  regain independence - be able to dress, drive, toileting independently    Currently in Pain?  Yes    Pain Score  3    up to 10/10; at best 0/10  Pain Location  Shoulder    Pain Orientation  Left    Pain Descriptors / Indicators  Sharp;Aching    Pain Type  Acute pain    Pain Radiating Towards  into upper arm/elbow    Pain Onset  1 to 4 weeks ago    Pain Frequency  Intermittent    Aggravating Factors   leaning on Lt side, raising arm    Pain Relieving Factors  medication, heat    Effect of Pain on Daily Activities  currently sleeping in recliner         [facility] PT Assessment - 08/14/19 0930      Assessment   Medical Diagnosis  Lt humerus fx    Referring Provider (PT)  Dr. Meridee Score    Onset Date/Surgical Date  07/22/19    Hand Dominance  Right    Next MD Visit  09/04/2019    Prior Therapy  at [facility] clinic for neck pain; Rt shoulder      Precautions   Precautions  None    Required Braces or Orthoses  Sling      Restrictions   Weight Bearing Restrictions  No      Balance Screen   Has the patient fallen in the past 6 months  Yes    How many times?  1    Has the patient had a decrease in activity level because of a fear of falling?   No    Is the patient reluctant to leave their home because of a fear of falling?   No      Home Social worker  Private residence    Living  Arrangements  Spouse/significant other    Additional Comments  A with ADLs      Prior Function   Level of Independence  Independent    Vocation  On disability    Leisure  movies, shopping, no regular exercise      Cognition   Overall Cognitive Status  Within Functional Limits for tasks assessed      Posture/Postural Control   Posture/Postural Control  Postural limitations    Postural Limitations  Rounded Shoulders;Forward head      ROM / Strength   AROM / PROM / Strength  AROM;PROM;Strength      AROM   AROM Assessment Site  Shoulder    Right/Left Shoulder  Left    Left Shoulder Flexion  121 Degrees   AA   Left Shoulder ABduction  70 Degrees      PROM   PROM Assessment Site  Shoulder    Right/Left Shoulder  Left    Left Shoulder Flexion  133 Degrees    Left Shoulder ABduction  95 Degrees    Left Shoulder External Rotation  27 Degrees      Strength   Overall Strength Comments  grossly 2/5 for Lt shoulder; unable to perform Lt shoulder flexion against gravity without elbow flexion initiation                Objective measurements completed on examination: See above findings.      [facility] Adult PT Treatment/Exercise - 08/14/19 0930      Exercises   Exercises  Shoulder;Elbow;Wrist;Hand      Elbow Exercises   Other elbow exercises  active flexion/extension x 5 reps; left      Shoulder Exercises: ROM/Strengthening   Pendulum  standing A/P, lateral, circumduction x 10 reps each      Hand Exercises  Other Hand Exercises  grip squeeze 5x5 sec      Wrist Exercises   Other wrist exercises  active circumduction x 5 reps; Left      Modalities   Modalities  Electrical Stimulation;Moist Heat      Moist Heat Therapy   Number Minutes Moist Heat  12 Minutes    Moist Heat Location  Shoulder      Electrical Stimulation   Electrical Stimulation Location  Lt shoulder    Electrical Stimulation Action  IFC    Electrical Stimulation Parameters  to tolerance x 12 min     Electrical Stimulation Goals  Pain             PT Education - 08/14/19 0954    Education Details  HEP    Person(s) Educated  Patient    Methods  Explanation;Demonstration;Handout    Comprehension  Verbalized understanding;Returned demonstration          PT Long Term Goals - 08/14/19 1007      PT LONG TERM GOAL #1   Title  independent with HEP    Status  New    Target Date  09/25/19      PT LONG TERM GOAL #2   Title  improve Lt shoulder AROM abduction and external rotation by at least 15 deg for improved function    Status  New    Target Date  09/25/19      PT LONG TERM GOAL #3   Title  report independence with ADLs for improved function and independence    Status  New    Target Date  09/25/19      PT LONG TERM GOAL #4   Title  report pain < 5/10 with activity for improved function    Status  New    Target Date  09/25/19             Plan - 08/14/19 [id number]    Clinical Impression Statement  [name] a 58 y/o female who presents to [facility] s/p Lt humerus fx on 07/22/2019.  Pt demonstrates decreased ROM and strength, as well as postural abnormalities and pain affecting function.  Pt will benefit from PT to address deficits listed.    Personal Factors and Comorbidities  Comorbidity 3+    Comorbidities  pulmonary sardoidosis, obesity, HTN, DM, depression, COPD, CHF, anxiety, Lt TKA    Examination-Activity Limitations  Bathing;Reach Overhead;Bed Mobility;Carry;Sleep;Dressing;Hygiene/Grooming;Toileting    Examination-Participation Restrictions  Cleaning;Meal Prep;Driving    Stability/Clinical Decision Making  Stable/Uncomplicated    Clinical Decision Making  Low    Rehab Potential  Good    PT Frequency  2x / week    PT Duration  6 weeks    PT Treatment/Interventions  ADLs/Self Care Home Management;Cryotherapy;Electrical Stimulation;Ultrasound;Moist Heat;Iontophoresis 4mg /ml Dexamethasone;Functional mobility training;Therapeutic activities;Therapeutic  exercise;Patient/family education;Neuromuscular re-education;Manual techniques;Passive range of motion;Vasopneumatic Device;Taping;Dry needling    PT Next Visit Plan  review HEP, progress to cane ROM exercises as tolerated, begin scap strengthening activities, modalities PRN    PT Home Exercise Plan  Access Code: [email or web address]    Consulted and Agree with Plan of Care  Patient       Patient will benefit from skilled therapeutic intervention in order to improve the following deficits and impairments:  Decreased range of motion, Increased fascial restricitons, Pain, Impaired UE functional use, Decreased strength, Postural dysfunction  Visit Diagnosis: Acute pain of left shoulder - Plan: PT plan of care cert/re-cert  Abnormal posture - Plan: PT plan of care cert/re-cert  Stiffness of left shoulder joint - Plan: PT plan of care cert/re-cert     Problem List Patient Active Problem List   Diagnosis Date Noted  . Nontraumatic complete tear of left rotator cuff   . Impingement syndrome of left shoulder   . Diabetic neuropathy ([place]) 02/20/2019  . Severe persistent asthma 01/04/2019  . Subcortical microvascular ischemic occlusive disease 07/12/2018  . Rapid palpitations 07/12/2018  . Impingement of left ankle joint 06/26/2018  . Constipation 04/21/2018  . Anxiety 04/05/2018  . Total knee replacement status, left 12/07/2017  . Dysphagia 09/20/2017  . Epiglottitis   . Plantar fasciitis, right 07/13/2017  . S/P total knee arthroplasty, right 06/29/2017  . Osteoarthrosis, localized, primary, knee, right   . Sprain of calcaneofibular ligament of right ankle 06/06/2017  . Osteoarthritis of right knee 01/26/2017  . Onychomycosis 10/27/2015  . CKD (chronic kidney disease) stage 3, GFR 30-59 ml/min 04/27/2015  . MRSA (methicillin resistant staph aureus) culture positive 03/27/2015  . History of osteomyelitis 03/27/2015  . Fatty liver 09/30/2014  . Hot flashes 07/15/2014  . Abnormal SPEP  04/17/2014  . Fracture of left leg 04/17/2014  . Cushingoid side effect of steroids ([place]) 04/17/2014  . Internal hemorrhoids   . Major depression in full remission ([place])   . Preoperative clearance 03/25/2014  . Obstructive chronic bronchitis without exacerbation ([place]) 09/18/2013  . Chest pain 04/11/2013  . Hypertensive heart disease with chronic diastolic congestive heart failure ([place])   . Solitary pulmonary nodule, on CT 02/2013 - stable over 2 years in 2015 02/20/2013  . Gout 08/20/2010  . Anemia 09/18/2009  . Morbid obesity ([place]) 06/04/2009  . Sleep apnea 04/21/2009  . Sarcoidosis of lung ([place]) 04/10/2007  . Hyperlipidemia associated with type 2 diabetes mellitus ([place]) 08/21/2006  . Hypertension associated with diabetes ([place]) 08/21/2006  . GERD 08/21/2006  . Type II diabetes mellitus with renal manifestations ([place]) 08/21/2006      Laureen Abrahams, PT, DPT 08/14/19 10:11 AM     [facility] [address] [place], [name], [address] Phone: [phone]   Fax:  [phone]  Name: [name] MRN: [id number] Date of Birth: 02-05-1961

## 2019-08-16 ENCOUNTER — Telehealth: Payer: Self-pay | Admitting: *Deleted

## 2019-08-16 NOTE — Telephone Encounter (Signed)
"  I'm calling to schedule my surgery again with Dr. Amalia Hailey.  I had to cancel it before."  Dr. Rebekah Chesterfield next available date is going to on November 01, 2019.  "Okay, that's fine."  I will put it down as tentative because it has to be done at [facility] and the other doctors do not release their block time until two weeks prior the surgery date.  You will need to have a history and physical form completed within 30 days of your surgery date.  You also will need a Covid test as well.  "Will you send me the forms?"  Yes, if you don't have the forms anymore, I will send them to you.  "Can you call me if there's any cancellations?"  I will put you on my wait list.  I mailed her the history and physical forms and the Covid testing instructions.

## 2019-08-17 ENCOUNTER — Encounter: Payer: Self-pay | Admitting: Physical Therapy

## 2019-08-17 ENCOUNTER — Ambulatory Visit (INDEPENDENT_AMBULATORY_CARE_PROVIDER_SITE_OTHER): Payer: 59 | Admitting: Physical Therapy

## 2019-08-17 ENCOUNTER — Other Ambulatory Visit: Payer: Self-pay

## 2019-08-17 DIAGNOSIS — R293 Abnormal posture: Secondary | ICD-10-CM

## 2019-08-17 DIAGNOSIS — M25612 Stiffness of left shoulder, not elsewhere classified: Secondary | ICD-10-CM | POA: Diagnosis not present

## 2019-08-17 DIAGNOSIS — M25512 Pain in left shoulder: Secondary | ICD-10-CM

## 2019-08-17 NOTE — Therapy (Signed)
[facility] [address] [place], [name], [address] Phone: [phone]   Fax:  [phone]  Physical Therapy Treatment  Patient Details  Name: [name] MRN: [id number] Date of Birth: 1961-08-12 Referring Provider (PT): Dr. Meridee Score   Encounter Date: 08/17/2019  PT End of Session - 08/17/19 0842    Visit Number  2    Number of Visits  12    Date for PT Re-Evaluation  09/25/19    PT Start Time  0810   pt arrived late   PT Stop Time  0855    PT Time Calculation (min)  45 min    Activity Tolerance  Patient tolerated treatment well    Behavior During Therapy  [facility] for tasks assessed/performed       Past Medical History:  Diagnosis Date  . Abnormal SPEP 04/17/2014  . Acute left ankle pain 01/26/2017  . ANEMIA-UNSPECIFIED 09/18/2009  . Anxiety   . Arthritis   . Chronic diastolic heart failure, NYHA class 2 (HCC)    Normal LVEDP by May 2018  . COPD (chronic obstructive pulmonary disease) ([place])   . Depression   . DIABETES MELLITUS, TYPE II 08/21/2006  . Diabetic osteomyelitis ([place]) 05/29/2015  . Fracture of 5th metatarsal    non union  . GERD 47/06/6282   Had fundoplication  . GOUT 08/20/2010  . Hammer toe of right foot    2-5th toes  . Hx of umbilical hernia repair   . HYPERLIPIDEMIA 08/21/2006  . HYPERTENSION 08/21/2006  . Infection of wound due to methicillin resistant Staphylococcus aureus (MRSA)   . Internal hemorrhoids   . Multiple allergies 10/14/2016  . OBESITY 06/04/2009  . Onychomycosis 10/27/2015  . Osteomyelitis of left foot ([place]) 05/29/2015  . Pulmonary sarcoidosis ([place])    Followed locally by pulmonology, but also by Dr. Casper Harrison at [facility]  . Right knee pain 01/26/2017  . Vocal cord dysfunction   . Wears partial dentures     Past Surgical History:  Procedure Laterality Date  . ABDOMINAL HYSTERECTOMY    . APPENDECTOMY    . BLADDER SUSPENSION  11/11/2011   Procedure: TRANSVAGINAL TAPE (TVT)  PROCEDURE;  Surgeon: Olga Millers, MD;  Location: [place] [facility];  Service: Gynecology;  Laterality: N/A;  . CAROTIDS  02/18/11   CAROTID DUPLEX; VERTEBRALS ARE PATENT WITH ANTEGRADE FLOW. ICA/CCA RATIO 1.61 ON RIGHT AND 0.75 ON LEFT  . CHOLECYSTECTOMY  1984  . CYSTOSCOPY  11/11/2011   Procedure: CYSTOSCOPY;  Surgeon: Olga Millers, MD;  Location: [place] [facility];  Service: Gynecology;  Laterality: N/A;  . EXTUBATION (ENDOTRACHEAL) IN OR N/A 09/23/2017   Procedure: EXTUBATION (ENDOTRACHEAL) IN OR;  Surgeon: Helayne Seminole, MD;  Location: [place];  Service: ENT;  Laterality: N/A;  . FIBEROPTIC LARYNGOSCOPY AND TRACHEOSCOPY N/A 09/23/2017   Procedure: FLEXIBLE FIBEROPTIC LARYNGOSCOPY;  Surgeon: Helayne Seminole, MD;  Location: [place];  Service: ENT;  Laterality: N/A;  . FRACTURE SURGERY     foot  . HALLUX FUSION Left 10/02/2018   Procedure: HALLUX ARTHRODESIS;  Surgeon: Edrick Kins, DPM;  Location: [facility];  Service: Podiatry;  Laterality: Left;  . HAMMER TOE SURGERY Left 10/02/2018   Procedure: HAMMER TOE CORRECTION 2ND 3RD 4RD FIFTH TOE;  Surgeon: Edrick Kins, DPM;  Location: [facility];  Service: Podiatry;  Laterality: Left;  . HAMMER TOE SURGERY Right 04/12/2019   Procedure: HAMMER TOE CORRECTION, 2ND, 3RD, 4TH AND 5TH TOES OF RIGHT FOOT;  Surgeon: Amalia Hailey,  Dorathy Daft, DPM;  Location: [place];  Service: Podiatry;  Laterality: Right;  . HERNIA REPAIR    . I&D EXTREMITY Left 06/27/2015   Procedure: Partial Excision Left Calcaneus, Place Antibiotic Beads, and Wound VAC;  Surgeon: Newt Minion, MD;  Location: [place];  Service: Orthopedics;  Laterality: Left;  . KNEE ARTHROSCOPY     right  . LEFT AND RIGHT HEART CATHETERIZATION WITH CORONARY ANGIOGRAM N/A 04/23/2013   Procedure: LEFT AND RIGHT HEART CATHETERIZATION WITH CORONARY ANGIOGRAM;  Surgeon: Leonie Man, MD;  Location: [facility] CATH [facility];  Service: Cardiovascular;  Laterality: N/A;  . LEFT HEART CATH AND CORONARY ANGIOGRAPHY N/A 03/11/2017   Procedure: Left  Heart Cath and Coronary Angiography;  Surgeon: Sherren Mocha, MD;  Location: [place] CV LAB; angiographically minimal CAD in the LAD otherwise normal.;  Normal LVEDP.  FALSE POSITIVE MYOVIEW  . LEFT HEART CATH AND CORONARY ANGIOGRAPHY  07/20/2010   LVEF 50-55% WITH VERY MILD GLOBAL HYPOKINESIA; ESSENTIALLY NORMAL CORONARY ARTERIES; NORMAL LV FUNCTION  . METATARSAL OSTEOTOMY WITH OPEN REDUCTION INTERNAL FIXATION (ORIF) METATARSAL WITH FUSION Left 04/09/2014   Procedure: LEFT FOOT FRACTURE OPEN TREATMENT METATARSAL INCLUDES INTERNAL FIXATION EACH;  Surgeon: Lorn Junes, MD;  Location: [place];  Service: Orthopedics;  Laterality: Left;  . NISSEN FUNDOPLICATION  1448  . NM MYOVIEW LTD  03/09/2013   Lexiscan --> EF 50%; NORMAL MYOCARDIAL PERFUSION STUDY - breast attenuation  . NM MYOVIEW LTD  03/10/2017   : Moderate size "stress-induced "perfusion defect at the apex as well as "ill-defined stress-induced perfusion defect in the lateral wall.  EF 55%.  INTERMEDIATE risk. -->  FALSE POSITIVE  . Right and left CARDIAC CATHETERIZATION  04/23/2013   Angiographic normal coronaries; LVEDP 20 mmHg, PCWP 12-14 mmHg, RAP 12 mmHg.; Fick CO/CI 4.9/2.2  . SHOULDER ARTHROSCOPY Left 03/14/2019   Procedure: LEFT SHOULDER ARTHROSCOPY, DEBRIDEMENT, AND DECOMPRESSION;  Surgeon: Newt Minion, MD;  Location: [place];  Service: Orthopedics;  Laterality: Left;  . TOTAL KNEE ARTHROPLASTY Right 06/29/2017   Procedure: RIGHT TOTAL KNEE ARTHROPLASTY;  Surgeon: Newt Minion, MD;  Location: [place];  Service: Orthopedics;  Laterality: Right;  . TOTAL KNEE ARTHROPLASTY Left 12/07/2017  . TOTAL KNEE ARTHROPLASTY Left 12/07/2017   Procedure: LEFT TOTAL KNEE ARTHROPLASTY;  Surgeon: Newt Minion, MD;  Location: [place];  Service: Orthopedics;  Laterality: Left;  . TRACHEOSTOMY TUBE PLACEMENT N/A 09/20/2017   Procedure: AWAKE INTUBATION WITH ANESTHESIA WITH VIDEO ASSISTANCE;  Surgeon: Helayne Seminole, MD;   Location: [place] [facility];  Service: ENT;  Laterality: N/A;  . TRANSTHORACIC ECHOCARDIOGRAM  08/2014   Normal LV size and function.  Mild LVH.  EF 55-60%.  Normal regional wall motion.  GR 1 DD.  Normal RV size and function .  [name] TUBAL LIGATION     with reversal in 1994  . VENTRAL HERNIA REPAIR      There were no vitals filed for this visit.  Subjective Assessment - 08/17/19 0814    Subjective  doing well, feels like shoulder is getting better.  having a little pain this morning.    Limitations  Lifting    Patient Stated Goals  regain independence - be able to dress, drive, toileting independently    Currently in Pain?  Yes    Pain Score  3     Pain Location  Shoulder    Pain Orientation  Left    Pain Type  Acute pain    Pain  Onset  1 to 4 weeks ago    Pain Frequency  Intermittent    Aggravating Factors   leaning on Lt side, raising arm    Pain Relieving Factors  meds, heat                       [facility] Adult PT Treatment/Exercise - 08/17/19 0001      Shoulder Exercises: Supine   External Rotation  AAROM;Left;15 reps   cane   Flexion  AAROM;Both;15 reps   cane   Other Supine Exercises  chest press with 1# bar x 15 reps    Other Supine Exercises  scap retraction 15 x 5 sec      Shoulder Exercises: ROM/Strengthening   Pendulum  standing A/P, lateral, circumduction x 15 reps each      Moist Heat Therapy   Number Minutes Moist Heat  12 Minutes    Moist Heat Location  Shoulder      Electrical Stimulation   Electrical Stimulation Location  Lt shoulder    Electrical Stimulation Action  IFC    Electrical Stimulation Parameters  to tolerance    Electrical Stimulation Goals  Pain      Manual Therapy   Manual Therapy  Passive ROM;Soft tissue mobilization    Soft tissue mobilization  IASTM to Lt biceps    Passive ROM  Lt shoulder gentle flexion/ext rotation             PT Education - 08/17/19 0842    Education Details  TENS    Person(s) Educated  Patient     Methods  Explanation;Handout    Comprehension  Verbalized understanding          PT Long Term Goals - 08/14/19 1007      PT LONG TERM GOAL #1   Title  independent with HEP    Status  New    Target Date  09/25/19      PT LONG TERM GOAL #2   Title  improve Lt shoulder AROM abduction and external rotation by at least 15 deg for improved function    Status  New    Target Date  09/25/19      PT LONG TERM GOAL #3   Title  report independence with ADLs for improved function and independence    Status  New    Target Date  09/25/19      PT LONG TERM GOAL #4   Title  report pain < 5/10 with activity for improved function    Status  New    Target Date  09/25/19            Plan - 08/17/19 0919    Clinical Impression Statement  Pt tolerated cane exercises well today with expected discomfort, with return to baseline pain after session.  Progressing well at this time, no goals met as only 2nd visit.    Personal Factors and Comorbidities  Comorbidity 3+    Comorbidities  pulmonary sardoidosis, obesity, HTN, DM, depression, COPD, CHF, anxiety, Lt TKA    Examination-Activity Limitations  Bathing;Reach Overhead;Bed Mobility;Carry;Sleep;Dressing;Hygiene/Grooming;Toileting    Examination-Participation Restrictions  Cleaning;Meal Prep;Driving    Stability/Clinical Decision Making  Stable/Uncomplicated    Rehab Potential  Good    PT Frequency  2x / week    PT Duration  6 weeks    PT Treatment/Interventions  ADLs/Self Care Home Management;Cryotherapy;Electrical Stimulation;Ultrasound;Moist Heat;Iontophoresis 42m/ml Dexamethasone;Functional mobility training;Therapeutic activities;Therapeutic exercise;Patient/family education;Neuromuscular re-education;Manual techniques;Passive range of motion;Vasopneumatic Device;Taping;Dry needling  PT Next Visit Plan  give cane exercises for HEP, begin scap strengthening activities, modalities PRN    PT Home Exercise Plan  Access Code: [email or web address]     Consulted and Agree with Plan of Care  Patient       Patient will benefit from skilled therapeutic intervention in order to improve the following deficits and impairments:  Decreased range of motion, Increased fascial restricitons, Pain, Impaired UE functional use, Decreased strength, Postural dysfunction  Visit Diagnosis: Acute pain of left shoulder  Abnormal posture  Stiffness of left shoulder joint     Problem List Patient Active Problem List   Diagnosis Date Noted  . Nontraumatic complete tear of left rotator cuff   . Impingement syndrome of left shoulder   . Diabetic neuropathy ([place]) 02/20/2019  . Severe persistent asthma 01/04/2019  . Subcortical microvascular ischemic occlusive disease 07/12/2018  . Rapid palpitations 07/12/2018  . Impingement of left ankle joint 06/26/2018  . Constipation 04/21/2018  . Anxiety 04/05/2018  . Total knee replacement status, left 12/07/2017  . Dysphagia 09/20/2017  . Epiglottitis   . Plantar fasciitis, right 07/13/2017  . S/P total knee arthroplasty, right 06/29/2017  . Osteoarthrosis, localized, primary, knee, right   . Sprain of calcaneofibular ligament of right ankle 06/06/2017  . Osteoarthritis of right knee 01/26/2017  . Onychomycosis 10/27/2015  . CKD (chronic kidney disease) stage 3, GFR 30-59 ml/min 04/27/2015  . MRSA (methicillin resistant staph aureus) culture positive 03/27/2015  . History of osteomyelitis 03/27/2015  . Fatty liver 09/30/2014  . Hot flashes 07/15/2014  . Abnormal SPEP 04/17/2014  . Fracture of left leg 04/17/2014  . Cushingoid side effect of steroids ([place]) 04/17/2014  . Internal hemorrhoids   . Major depression in full remission ([place])   . Preoperative clearance 03/25/2014  . Obstructive chronic bronchitis without exacerbation ([place]) 09/18/2013  . Chest pain 04/11/2013  . Hypertensive heart disease with chronic diastolic congestive heart failure ([place])   . Solitary pulmonary nodule, on CT 02/2013 - stable  over 2 years in 2015 02/20/2013  . Gout 08/20/2010  . Anemia 09/18/2009  . Morbid obesity ([place]) 06/04/2009  . Sleep apnea 04/21/2009  . Sarcoidosis of lung ([place]) 04/10/2007  . Hyperlipidemia associated with type 2 diabetes mellitus ([place]) 08/21/2006  . Hypertension associated with diabetes ([place]) 08/21/2006  . GERD 08/21/2006  . Type II diabetes mellitus with renal manifestations ([place]) 08/21/2006      Laureen Abrahams, PT, DPT 08/17/19 9:21 AM     [facility] [address] [place], [name], [address] Phone: [phone]   Fax:  [phone]  Name: [name] MRN: [id number] Date of Birth: 1961-04-13

## 2019-08-17 NOTE — Patient Instructions (Signed)
TENS UNIT  This is helpful for muscle pain and spasm.   Search and Purchase a TENS 7000 2nd edition at www.tenspros.com or www.amazon.com  (It should be less than $30)     TENS unit instructions:   Do not shower or bathe with the unit on  Turn the unit off before removing electrodes or batteries  If the electrodes lose stickiness add a drop of water to the electrodes after they are disconnected from the unit and place on plastic sheet. If you continued to have difficulty, call the TENS unit company to purchase more electrodes.  Do not apply lotion on the skin area prior to use. Make sure the skin is clean and dry as this will help prolong the life of the electrodes.  After use, always check skin for unusual red areas, rash or other skin difficulties. If there are any skin problems, does not apply electrodes to the same area.  Never remove the electrodes from the unit by pulling the wires.  Do not use the TENS unit or electrodes other than as directed.  Do not change electrode placement without consulting your therapist or physician.  Keep 2 fingers with between each electrode.  

## 2019-08-20 ENCOUNTER — Telehealth: Payer: Self-pay | Admitting: *Deleted

## 2019-08-20 ENCOUNTER — Other Ambulatory Visit: Payer: Self-pay

## 2019-08-20 ENCOUNTER — Encounter: Payer: Self-pay | Admitting: Physical Therapy

## 2019-08-20 ENCOUNTER — Ambulatory Visit (INDEPENDENT_AMBULATORY_CARE_PROVIDER_SITE_OTHER): Payer: 59 | Admitting: Physical Therapy

## 2019-08-20 DIAGNOSIS — M25612 Stiffness of left shoulder, not elsewhere classified: Secondary | ICD-10-CM

## 2019-08-20 DIAGNOSIS — M25512 Pain in left shoulder: Secondary | ICD-10-CM | POA: Diagnosis not present

## 2019-08-20 DIAGNOSIS — R293 Abnormal posture: Secondary | ICD-10-CM

## 2019-08-20 NOTE — Patient Instructions (Signed)
Access Code: [email or web address]  URL: [email or web address]  Date: 08/20/2019  Prepared by: Faustino Congress   Exercises  Seated Gripping Towel - 10 reps - 1 sets - 5 sec hold - 2x daily - 7x weekly  Wrist AROM Wrist Circumduction - 10 reps - 1 sets - 2x daily - 7x weekly  Standing Elbow Flexion Extension AROM - 10 reps - 1 sets - 2x daily - 7x weekly  Circular Shoulder Pendulum with Table Support - 10 reps - 1 sets - 2x daily - 7x weekly  Flexion-Extension Shoulder Pendulum with Table Support - 10 reps - 1 sets - 2x daily - 7x weekly  Horizontal Shoulder Pendulum with Table Support - 10 reps - 1 sets - 2x daily - 7x weekly  Supine Shoulder Flexion with Dowel - 15 reps - 1 sets - 3-5 sec hold - 2x daily - 7x weekly  Supine Shoulder External Rotation with Dowel - 15 reps - 1 sets - 1-2 sec hold - 2x daily - 7x weekly  Supine Shoulder Press AAROM in Abduction with Dowel - 15 reps - 1 sets - 2x daily - 7x weekly

## 2019-08-20 NOTE — Therapy (Signed)
[facility] Physical Therapy [address] [place], [name], [address] Phone: [phone]   Fax:  [phone]  Physical Therapy Treatment  Patient Details  Name: [name] MRN: [id number] Date of Birth: [birth year] Referring Provider (PT): Dr. Meridee Score   Encounter Date: 08/20/2019  PT End of Session - 08/20/19 0843    Visit Number  3    Number of Visits  12    Date for PT Re-Evaluation  09/25/19    PT Start Time  0808    PT Stop Time  0852    PT Time Calculation (min)  44 min    Activity Tolerance  Patient tolerated treatment well    Behavior During Therapy  [facility] for tasks assessed/performed       Past Medical History:  Diagnosis Date  . Abnormal SPEP 04/17/2014  . Acute left ankle pain 01/26/2017  . ANEMIA-UNSPECIFIED 09/18/2009  . Anxiety   . Arthritis   . Chronic diastolic heart failure, NYHA class 2 (HCC)    Normal LVEDP by May 2018  . COPD (chronic obstructive pulmonary disease) ([place])   . Depression   . DIABETES MELLITUS, TYPE II 08/21/2006  . Diabetic osteomyelitis ([place]) 05/29/2015  . Fracture of 5th metatarsal    non union  . GERD [id number]   Had fundoplication  . GOUT 08/20/2010  . Hammer toe of right foot    2-5th toes  . Hx of umbilical hernia repair   . HYPERLIPIDEMIA 08/21/2006  . HYPERTENSION 08/21/2006  . Infection of wound due to methicillin resistant Staphylococcus aureus (MRSA)   . Internal hemorrhoids   . Multiple allergies 10/14/2016  . OBESITY 06/04/2009  . Onychomycosis 10/27/2015  . Osteomyelitis of left foot ([place]) 05/29/2015  . Pulmonary sarcoidosis ([place])    Followed locally by pulmonology, but also by Dr. Casper Harrison at [facility]  . Right knee pain 01/26/2017  . Vocal cord dysfunction   . Wears partial dentures     Past Surgical History:  Procedure Laterality Date  . ABDOMINAL HYSTERECTOMY    . APPENDECTOMY    . BLADDER SUSPENSION  11/11/2011   Procedure: TRANSVAGINAL TAPE (TVT) PROCEDURE;  Surgeon:  Olga Millers, MD;  Location: [facility];  Service: Gynecology;  Laterality: N/A;  . CAROTIDS  02/18/11   CAROTID DUPLEX; VERTEBRALS ARE PATENT WITH ANTEGRADE FLOW. ICA/CCA RATIO 1.61 ON RIGHT AND 0.75 ON LEFT  . CHOLECYSTECTOMY  1984  . CYSTOSCOPY  11/11/2011   Procedure: CYSTOSCOPY;  Surgeon: Olga Millers, MD;  Location: [facility];  Service: Gynecology;  Laterality: N/A;  . EXTUBATION (ENDOTRACHEAL) IN OR N/A 09/23/2017   Procedure: EXTUBATION (ENDOTRACHEAL) IN OR;  Surgeon: Helayne Seminole, MD;  Location: [facility];  Service: ENT;  Laterality: N/A;  . FIBEROPTIC LARYNGOSCOPY AND TRACHEOSCOPY N/A 09/23/2017   Procedure: FLEXIBLE FIBEROPTIC LARYNGOSCOPY;  Surgeon: Helayne Seminole, MD;  Location: [facility];  Service: ENT;  Laterality: N/A;  . FRACTURE SURGERY     foot  . HALLUX FUSION Left 10/02/2018   Procedure: HALLUX ARTHRODESIS;  Surgeon: Edrick Kins, DPM;  Location: [facility];  Service: Podiatry;  Laterality: Left;  . HAMMER TOE SURGERY Left 10/02/2018   Procedure: HAMMER TOE CORRECTION 2ND 3RD 4RD FIFTH TOE;  Surgeon: Edrick Kins, DPM;  Location: [facility];  Service: Podiatry;  Laterality: Left;  . HAMMER TOE SURGERY Right 04/12/2019   Procedure: HAMMER TOE CORRECTION, 2ND, 3RD, 4TH AND 5TH TOES OF RIGHT FOOT;  Surgeon: Edrick Kins, DPM;  Location: [facility];  Service: Podiatry;  Laterality: Right;  . HERNIA REPAIR    . I&D EXTREMITY Left 06/27/2015   Procedure: Partial Excision Left Calcaneus, Place Antibiotic Beads, and Wound VAC;  Surgeon: Newt Minion, MD;  Location: [facility];  Service: Orthopedics;  Laterality: Left;  . KNEE ARTHROSCOPY     right  . LEFT AND RIGHT HEART CATHETERIZATION WITH CORONARY ANGIOGRAM N/A 04/23/2013   Procedure: LEFT AND RIGHT HEART CATHETERIZATION WITH CORONARY ANGIOGRAM;  Surgeon: Leonie Man, MD;  Location: [facility];  Service: Cardiovascular;  Laterality: N/A;  . LEFT HEART CATH AND CORONARY ANGIOGRAPHY N/A 03/11/2017   Procedure: Left Heart Cath and  Coronary Angiography;  Surgeon: Sherren Mocha, MD;  Location: [facility] [place]; angiographically minimal CAD in the LAD otherwise normal.;  Normal LVEDP.  FALSE POSITIVE MYOVIEW  . LEFT HEART CATH AND CORONARY ANGIOGRAPHY  07/20/2010   LVEF 50-55% WITH VERY MILD GLOBAL HYPOKINESIA; ESSENTIALLY NORMAL CORONARY ARTERIES; NORMAL LV FUNCTION  . METATARSAL OSTEOTOMY WITH OPEN REDUCTION INTERNAL FIXATION (ORIF) METATARSAL WITH FUSION Left 04/09/2014   Procedure: LEFT FOOT FRACTURE OPEN TREATMENT METATARSAL INCLUDES INTERNAL FIXATION EACH;  Surgeon: Lorn Junes, MD;  Location: [facility];  Service: Orthopedics;  Laterality: Left;  . NISSEN FUNDOPLICATION  [id number]  . NM MYOVIEW LTD  03/09/2013   Lexiscan --> EF 50%; NORMAL MYOCARDIAL PERFUSION STUDY - breast attenuation  . NM MYOVIEW LTD  03/10/2017   : Moderate size "stress-induced "perfusion defect at the apex as well as "ill-defined stress-induced perfusion defect in the lateral wall.  EF 55%.  INTERMEDIATE risk. -->  FALSE POSITIVE  . Right and left CARDIAC CATHETERIZATION  04/23/2013   Angiographic normal coronaries; LVEDP 20 mmHg, PCWP 12-14 mmHg, RAP 12 mmHg.; Fick CO/CI 4.9/2.2  . SHOULDER ARTHROSCOPY Left 03/14/2019   Procedure: LEFT SHOULDER ARTHROSCOPY, DEBRIDEMENT, AND DECOMPRESSION;  Surgeon: Newt Minion, MD;  Location: [facility];  Service: Orthopedics;  Laterality: Left;  . TOTAL KNEE ARTHROPLASTY Right 06/29/2017   Procedure: RIGHT TOTAL KNEE ARTHROPLASTY;  Surgeon: Newt Minion, MD;  Location: [facility];  Service: Orthopedics;  Laterality: Right;  . TOTAL KNEE ARTHROPLASTY Left 12/07/2017  . TOTAL KNEE ARTHROPLASTY Left 12/07/2017   Procedure: LEFT TOTAL KNEE ARTHROPLASTY;  Surgeon: Newt Minion, MD;  Location: [facility];  Service: Orthopedics;  Laterality: Left;  . TRACHEOSTOMY TUBE PLACEMENT N/A 09/20/2017   Procedure: AWAKE INTUBATION WITH ANESTHESIA WITH VIDEO ASSISTANCE;  Surgeon: Helayne Seminole, MD;  Location: [facility];   Service: ENT;  Laterality: N/A;  . TRANSTHORACIC ECHOCARDIOGRAM  08/2014   Normal LV size and function.  Mild LVH.  EF 55-60%.  Normal regional wall motion.  GR 1 DD.  Normal RV size and function .  [name] TUBAL LIGATION     with reversal in 1994  . VENTRAL HERNIA REPAIR      There were no vitals filed for this visit.   Subjective Assessment - 08/20/19 0810    Subjective  doing well; no pain today - just a little stiff and sore    Limitations  Lifting    Patient Stated Goals  regain independence - be able to dress, drive, toileting independently    Currently in Pain?  No/denies    Pain Onset  1 to 4 weeks ago                    Objective measurements completed on examination: See above findings.  [facility] Adult PT Treatment/Exercise - 08/20/19 0811      Self-Care   Self-Care  Other Self-Care Comments    Other Self-Care Comments   instructed in application and home TENS unit use-pt verbalized understanding      Shoulder Exercises: Supine   External Rotation  AAROM;Left;15 reps   cane   Flexion  AAROM;Both;15 reps   cane   Other Supine Exercises  chest press with 1# bar x 15 reps    Other Supine Exercises  scap retraction 15 x 5 sec      Shoulder Exercises: Seated   Retraction  Both;15 reps   5 sec hold     Shoulder Exercises: ROM/Strengthening   Rhythmic Stabilization, Supine  3x15 sec all directions      Moist Heat Therapy   Number Minutes Moist Heat  15 Minutes    Moist Heat Location  Shoulder      Electrical Stimulation   Electrical Stimulation Location  Lt shoulder    Electrical Stimulation Action  IFC-TENS    Electrical Stimulation Parameters  to tolerance      Manual Therapy   Manual Therapy  Passive ROM    Passive ROM  Lt shoulder gentle flexion/ext rotation, scaption             PT Education - 08/20/19 0843    Education Details  HEP    Person(s) Educated  Patient    Methods  Explanation;Demonstration;Handout    Comprehension   Verbalized understanding;Returned demonstration;Need further instruction          PT Long Term Goals - 08/14/19 1007      PT LONG TERM GOAL #1   Title  independent with HEP    Status  New    Target Date  09/25/19      PT LONG TERM GOAL #2   Title  improve Lt shoulder AROM abduction and external rotation by at least 15 deg for improved function    Status  New    Target Date  09/25/19      PT LONG TERM GOAL #3   Title  report independence with ADLs for improved function and independence    Status  New    Target Date  09/25/19      PT LONG TERM GOAL #4   Title  report pain < 5/10 with activity for improved function    Status  New    Target Date  09/25/19             Plan - 08/20/19 0843    Clinical Impression Statement  Progressing well with PT, and purchased home TENS unit to help with pain.  Will continue to benefit from PT to maximize function.    Personal Factors and Comorbidities  Comorbidity 3+    Comorbidities  pulmonary sardoidosis, obesity, HTN, DM, depression, COPD, CHF, anxiety, Lt TKA    Examination-Activity Limitations  Bathing;Reach Overhead;Bed Mobility;Carry;Sleep;Dressing;Hygiene/Grooming;Toileting    Examination-Participation Restrictions  Cleaning;Meal Prep;Driving    Stability/Clinical Decision Making  Stable/Uncomplicated    Rehab Potential  Good    PT Frequency  2x / week    PT Duration  6 weeks    PT Treatment/Interventions  ADLs/Self Care Home Management;Cryotherapy;Electrical Stimulation;Ultrasound;Moist Heat;Iontophoresis 4mg /ml Dexamethasone;Functional mobility training;Therapeutic activities;Therapeutic exercise;Patient/family education;Neuromuscular re-education;Manual techniques;Passive range of motion;Vasopneumatic Device;Taping;Dry needling    PT Next Visit Plan  review cane HEP, begin scap strengthening activities, modalities PRN    PT Home Exercise Plan  Access Code: [email or web address] and  Agree with Plan of Care  Patient        Patient will benefit from skilled therapeutic intervention in order to improve the following deficits and impairments:  Decreased range of motion, Increased fascial restricitons, Pain, Impaired UE functional use, Decreased strength, Postural dysfunction  Visit Diagnosis: Acute pain of left shoulder  Abnormal posture  Stiffness of left shoulder joint     Problem List Patient Active Problem List   Diagnosis Date Noted  . Nontraumatic complete tear of left rotator cuff   . Impingement syndrome of left shoulder   . Diabetic neuropathy ([place]) 02/20/2019  . Severe persistent asthma 01/04/2019  . Subcortical microvascular ischemic occlusive disease 07/12/2018  . Rapid palpitations 07/12/2018  . Impingement of left ankle joint 06/26/2018  . Constipation 04/21/2018  . Anxiety 04/05/2018  . Total knee replacement status, left 12/07/2017  . Dysphagia 09/20/2017  . Epiglottitis   . Plantar fasciitis, right 07/13/2017  . S/P total knee arthroplasty, right 06/29/2017  . Osteoarthrosis, localized, primary, knee, right   . Sprain of calcaneofibular ligament of right ankle 06/06/2017  . Osteoarthritis of right knee 01/26/2017  . Onychomycosis 10/27/2015  . CKD (chronic kidney disease) stage 3, GFR 30-59 ml/min 04/27/2015  . MRSA (methicillin resistant staph aureus) culture positive 03/27/2015  . History of osteomyelitis 03/27/2015  . Fatty liver 09/30/2014  . Hot flashes 07/15/2014  . Abnormal SPEP 04/17/2014  . Fracture of left leg 04/17/2014  . Cushingoid side effect of steroids ([place]) 04/17/2014  . Internal hemorrhoids   . Major depression in full remission ([place])   . Preoperative clearance 03/25/2014  . Obstructive chronic bronchitis without exacerbation ([place]) 09/18/2013  . Chest pain 04/11/2013  . Hypertensive heart disease with chronic diastolic congestive heart failure ([place])   . Solitary pulmonary nodule, on CT 02/2013 - stable over 2 years in 2015 02/20/2013  . Gout  08/20/2010  . Anemia 09/18/2009  . Morbid obesity ([place]) 06/04/2009  . Sleep apnea 04/21/2009  . Sarcoidosis of lung ([place]) 04/10/2007  . Hyperlipidemia associated with type 2 diabetes mellitus ([place]) 08/21/2006  . Hypertension associated with diabetes ([place]) 08/21/2006  . GERD 08/21/2006  . Type II diabetes mellitus with renal manifestations ([place]) 08/21/2006      Laureen Abrahams, PT, DPT 08/20/19 8:45 AM     [facility] Physical Therapy [address] [place], [name], [address] Phone: [phone]   Fax:  [phone]  Name: SARISSA AZLIN MRN: [id number] Date of Birth: 08/07/1961

## 2019-08-20 NOTE — Telephone Encounter (Signed)
I attempted to call [name] to see if we can move her surgery date to 10/25/2019.  Time is not available for November 01, 2019 at this time.  I left her a message to call me back.    "Sorry I missed your call."  I was calling to see if we can change your surgery date to October 25, 2019 instead of November 01, 2019.  "Yes, that will be fine."  I'll get your surgery scheduled for 10/25/2019.  "Okay, thank you."

## 2019-08-22 ENCOUNTER — Other Ambulatory Visit: Payer: Self-pay | Admitting: Family Medicine

## 2019-08-22 ENCOUNTER — Encounter: Payer: Self-pay | Admitting: Physical Therapy

## 2019-08-22 ENCOUNTER — Ambulatory Visit (INDEPENDENT_AMBULATORY_CARE_PROVIDER_SITE_OTHER): Payer: 59 | Admitting: Physical Therapy

## 2019-08-22 ENCOUNTER — Other Ambulatory Visit: Payer: Self-pay

## 2019-08-22 DIAGNOSIS — M25612 Stiffness of left shoulder, not elsewhere classified: Secondary | ICD-10-CM

## 2019-08-22 DIAGNOSIS — R293 Abnormal posture: Secondary | ICD-10-CM | POA: Diagnosis not present

## 2019-08-22 DIAGNOSIS — M25512 Pain in left shoulder: Secondary | ICD-10-CM

## 2019-08-22 NOTE — Patient Instructions (Signed)
Access Code: [email or web address]  URL: [email or web address]  Date: 08/22/2019  Prepared by: Faustino Congress   Exercises  Seated Gripping Towel - 10 reps - 1 sets - 5 sec hold - 2x daily - 7x weekly  Wrist AROM Wrist Circumduction - 10 reps - 1 sets - 2x daily - 7x weekly  Standing Elbow Flexion Extension AROM - 10 reps - 1 sets - 2x daily - 7x weekly  Circular Shoulder Pendulum with Table Support - 10 reps - 1 sets - 2x daily - 7x weekly  Flexion-Extension Shoulder Pendulum with Table Support - 10 reps - 1 sets - 2x daily - 7x weekly  Horizontal Shoulder Pendulum with Table Support - 10 reps - 1 sets - 2x daily - 7x weekly  Supine Shoulder Flexion with Dowel - 15 reps - 1 sets - 3-5 sec hold - 2x daily - 7x weekly  Supine Shoulder External Rotation with Dowel - 15 reps - 1 sets - 1-2 sec hold - 2x daily - 7x weekly  Supine Shoulder Press AAROM in Abduction with Dowel - 15 reps - 1 sets - 2x daily - 7x weekly  Standing Row with Anchored Resistance - 15 reps - 1 sets - 2x daily - 7x weekly

## 2019-08-22 NOTE — Therapy (Signed)
[facility] Physical Therapy [address] [place], [name], [address] Phone: [phone]   Fax:  [phone]  Physical Therapy Treatment  Patient Details  Name: [name] MRN: [id number] Date of Birth: [birth year] Referring Provider (PT): Dr. Meridee Score   Encounter Date: 08/22/2019  PT End of Session - 08/22/19 0925    Visit Number  4    Number of Visits  12    Date for PT Re-Evaluation  09/25/19    PT Start Time  0845    PT Stop Time  0935    PT Time Calculation (min)  50 min    Activity Tolerance  Patient tolerated treatment well    Behavior During Therapy  [facility] for tasks assessed/performed       Past Medical History:  Diagnosis Date  . Abnormal SPEP 04/17/2014  . Acute left ankle pain 01/26/2017  . ANEMIA-UNSPECIFIED 09/18/2009  . Anxiety   . Arthritis   . Chronic diastolic heart failure, NYHA class 2 (HCC)    Normal LVEDP by May 2018  . COPD (chronic obstructive pulmonary disease) ([place])   . Depression   . DIABETES MELLITUS, TYPE II 08/21/2006  . Diabetic osteomyelitis ([place]) 05/29/2015  . Fracture of 5th metatarsal    non union  . GERD [id number]   Had fundoplication  . GOUT 08/20/2010  . Hammer toe of right foot    2-5th toes  . Hx of umbilical hernia repair   . HYPERLIPIDEMIA 08/21/2006  . HYPERTENSION 08/21/2006  . Infection of wound due to methicillin resistant Staphylococcus aureus (MRSA)   . Internal hemorrhoids   . Multiple allergies 10/14/2016  . OBESITY 06/04/2009  . Onychomycosis 10/27/2015  . Osteomyelitis of left foot ([place]) 05/29/2015  . Pulmonary sarcoidosis ([place])    Followed locally by pulmonology, but also by Dr. Casper Harrison at [facility]  . Right knee pain 01/26/2017  . Vocal cord dysfunction   . Wears partial dentures     Past Surgical History:  Procedure Laterality Date  . ABDOMINAL HYSTERECTOMY    . APPENDECTOMY    . BLADDER SUSPENSION  11/11/2011   Procedure: TRANSVAGINAL TAPE (TVT) PROCEDURE;  Surgeon:  Olga Millers, MD;  Location: [place] [facility];  Service: Gynecology;  Laterality: N/A;  . CAROTIDS  02/18/11   CAROTID DUPLEX; VERTEBRALS ARE PATENT WITH ANTEGRADE FLOW. ICA/CCA RATIO 1.61 ON RIGHT AND 0.75 ON LEFT  . CHOLECYSTECTOMY  1984  . CYSTOSCOPY  11/11/2011   Procedure: CYSTOSCOPY;  Surgeon: Olga Millers, MD;  Location: [place] [facility];  Service: Gynecology;  Laterality: N/A;  . EXTUBATION (ENDOTRACHEAL) IN OR N/A 09/23/2017   Procedure: EXTUBATION (ENDOTRACHEAL) IN OR;  Surgeon: Helayne Seminole, MD;  Location: [place];  Service: ENT;  Laterality: N/A;  . FIBEROPTIC LARYNGOSCOPY AND TRACHEOSCOPY N/A 09/23/2017   Procedure: FLEXIBLE FIBEROPTIC LARYNGOSCOPY;  Surgeon: Helayne Seminole, MD;  Location: [place];  Service: ENT;  Laterality: N/A;  . FRACTURE SURGERY     foot  . HALLUX FUSION Left 10/02/2018   Procedure: HALLUX ARTHRODESIS;  Surgeon: Edrick Kins, DPM;  Location: [facility];  Service: Podiatry;  Laterality: Left;  . HAMMER TOE SURGERY Left 10/02/2018   Procedure: HAMMER TOE CORRECTION 2ND 3RD 4RD FIFTH TOE;  Surgeon: Edrick Kins, DPM;  Location: [facility];  Service: Podiatry;  Laterality: Left;  . HAMMER TOE SURGERY Right 04/12/2019   Procedure: HAMMER TOE CORRECTION, 2ND, 3RD, 4TH AND 5TH TOES OF RIGHT FOOT;  Surgeon: Edrick Kins, DPM;  Location: [place];  Service: Podiatry;  Laterality: Right;  . HERNIA REPAIR    . I&D EXTREMITY Left 06/27/2015   Procedure: Partial Excision Left Calcaneus, Place Antibiotic Beads, and Wound VAC;  Surgeon: Newt Minion, MD;  Location: [place];  Service: Orthopedics;  Laterality: Left;  . KNEE ARTHROSCOPY     right  . LEFT AND RIGHT HEART CATHETERIZATION WITH CORONARY ANGIOGRAM N/A 04/23/2013   Procedure: LEFT AND RIGHT HEART CATHETERIZATION WITH CORONARY ANGIOGRAM;  Surgeon: Leonie Man, MD;  Location: [facility];  Service: Cardiovascular;  Laterality: N/A;  . LEFT HEART CATH AND CORONARY ANGIOGRAPHY N/A 03/11/2017   Procedure: Left Heart Cath and  Coronary Angiography;  Surgeon: Sherren Mocha, MD;  Location: [place] [facility] LAB; angiographically minimal CAD in the LAD otherwise normal.;  Normal LVEDP.  FALSE POSITIVE MYOVIEW  . LEFT HEART CATH AND CORONARY ANGIOGRAPHY  07/20/2010   LVEF 50-55% WITH VERY MILD GLOBAL HYPOKINESIA; ESSENTIALLY NORMAL CORONARY ARTERIES; NORMAL LV FUNCTION  . METATARSAL OSTEOTOMY WITH OPEN REDUCTION INTERNAL FIXATION (ORIF) METATARSAL WITH FUSION Left 04/09/2014   Procedure: LEFT FOOT FRACTURE OPEN TREATMENT METATARSAL INCLUDES INTERNAL FIXATION EACH;  Surgeon: Lorn Junes, MD;  Location: [place];  Service: Orthopedics;  Laterality: Left;  . NISSEN FUNDOPLICATION  [id number]  . NM MYOVIEW LTD  03/09/2013   Lexiscan --> EF 50%; NORMAL MYOCARDIAL PERFUSION STUDY - breast attenuation  . NM MYOVIEW LTD  03/10/2017   : Moderate size "stress-induced "perfusion defect at the apex as well as "ill-defined stress-induced perfusion defect in the lateral wall.  EF 55%.  INTERMEDIATE risk. -->  FALSE POSITIVE  . Right and left CARDIAC CATHETERIZATION  04/23/2013   Angiographic normal coronaries; LVEDP 20 mmHg, PCWP 12-14 mmHg, RAP 12 mmHg.; Fick CO/CI 4.9/2.2  . SHOULDER ARTHROSCOPY Left 03/14/2019   Procedure: LEFT SHOULDER ARTHROSCOPY, DEBRIDEMENT, AND DECOMPRESSION;  Surgeon: Newt Minion, MD;  Location: [place];  Service: Orthopedics;  Laterality: Left;  . TOTAL KNEE ARTHROPLASTY Right 06/29/2017   Procedure: RIGHT TOTAL KNEE ARTHROPLASTY;  Surgeon: Newt Minion, MD;  Location: [place];  Service: Orthopedics;  Laterality: Right;  . TOTAL KNEE ARTHROPLASTY Left 12/07/2017  . TOTAL KNEE ARTHROPLASTY Left 12/07/2017   Procedure: LEFT TOTAL KNEE ARTHROPLASTY;  Surgeon: Newt Minion, MD;  Location: [place];  Service: Orthopedics;  Laterality: Left;  . TRACHEOSTOMY TUBE PLACEMENT N/A 09/20/2017   Procedure: AWAKE INTUBATION WITH ANESTHESIA WITH VIDEO ASSISTANCE;  Surgeon: Helayne Seminole, MD;  Location: [place] [facility];   Service: ENT;  Laterality: N/A;  . TRANSTHORACIC ECHOCARDIOGRAM  08/2014   Normal LV size and function.  Mild LVH.  EF 55-60%.  Normal regional wall motion.  GR 1 DD.  Normal RV size and function .  [name] TUBAL LIGATION     with reversal in 1994  . VENTRAL HERNIA REPAIR      There were no vitals filed for this visit.  Subjective Assessment - 08/22/19 0847    Subjective  slept in the bed last night-no issues.  out of sling    Currently in Pain?  Yes    Pain Score  1     Pain Location  Shoulder    Pain Orientation  Left    Pain Descriptors / Indicators  Aching    Pain Type  Acute pain    Pain Onset  1 to 4 weeks ago    Aggravating Factors   raising arm    Pain Relieving Factors  meds, heat                       [facility] Adult PT Treatment/Exercise - 08/22/19 0849      Shoulder Exercises: Supine   Flexion  AAROM;Left;15 reps   progressing to active     Shoulder Exercises: Sidelying   External Rotation  AROM;Left;15 reps    ABduction  AAROM;Left;15 reps      Shoulder Exercises: Standing   External Rotation  AAROM;Left;15 reps   cane   Internal Rotation  AAROM;Both;15 reps   cane   Flexion  15 reps;AAROM;Both   cane; limited motion   Flexion Limitations  cues to avoid shoulder shrug    ABduction  AAROM;Left;15 reps   cane; limited motion   ABduction Limitations  cues to avoid shoulder shrug    Extension  Both;15 reps;AAROM   cane   Row  Both;15 reps;Theraband    Theraband Level (Shoulder Row)  Level 1 (Yellow)    Row Limitations  5 sec hold      Shoulder Exercises: Therapy Ball   Flexion  Both;15 reps    Flexion Limitations  seated; 5 sec hold    Scaption  Left;15 reps    Scaption Limitations  5 sec hold      Moist Heat Therapy   Number Minutes Moist Heat  12 Minutes    Moist Heat Location  Shoulder      Electrical Stimulation   Electrical Stimulation Location  Lt shoulder    Electrical Stimulation Action  IFC    Electrical Stimulation Parameters   to tolerance    Electrical Stimulation Goals  Pain      Manual Therapy   Manual Therapy  Passive ROM    Passive ROM  Lt shoulder gentle flexion/ext rotation, scaption             PT Education - 08/22/19 0925    Education Details  rows    Person(s) Educated  Patient    Methods  Explanation;Demonstration;Handout    Comprehension  Verbalized understanding;Returned demonstration;Need further instruction          PT Long Term Goals - 08/14/19 1007      PT LONG TERM GOAL #1   Title  independent with HEP    Status  New    Target Date  09/25/19      PT LONG TERM GOAL #2   Title  improve Lt shoulder AROM abduction and external rotation by at least 15 deg for improved function    Status  New    Target Date  09/25/19      PT LONG TERM GOAL #3   Title  report independence with ADLs for improved function and independence    Status  New    Target Date  09/25/19      PT LONG TERM GOAL #4   Title  report pain < 5/10 with activity for improved function    Status  New    Target Date  09/25/19            Plan - 08/22/19 0925    Clinical Impression Statement  Pt able to progress to AA and active supine exercises today and tolerated well with minimal pain.  Overall progressing well with PT.    Personal Factors and Comorbidities  Comorbidity 3+    Comorbidities  pulmonary sardoidosis, obesity, HTN, DM, depression, COPD, CHF, anxiety, Lt TKA    Examination-Activity Limitations  Bathing;Reach Overhead;Bed Mobility;Carry;Sleep;Dressing;Hygiene/Grooming;Toileting  Examination-Participation Restrictions  Cleaning;Meal Prep;Driving    Stability/Clinical Decision Making  Stable/Uncomplicated    Rehab Potential  Good    PT Frequency  2x / week    PT Duration  6 weeks    PT Treatment/Interventions  ADLs/Self Care Home Management;Cryotherapy;Electrical Stimulation;Ultrasound;Moist Heat;Iontophoresis 4mg /ml Dexamethasone;Functional mobility training;Therapeutic  activities;Therapeutic exercise;Patient/family education;Neuromuscular re-education;Manual techniques;Passive range of motion;Vasopneumatic Device;Taping;Dry needling    PT Next Visit Plan  review cane HEP, begin scap strengthening activities, modalities PRN    PT Home Exercise Plan  Access Code: [email or web address]    Consulted and Agree with Plan of Care  Patient       Patient will benefit from skilled therapeutic intervention in order to improve the following deficits and impairments:  Decreased range of motion, Increased fascial restricitons, Pain, Impaired UE functional use, Decreased strength, Postural dysfunction  Visit Diagnosis: Acute pain of left shoulder  Abnormal posture  Stiffness of left shoulder joint     Problem List Patient Active Problem List   Diagnosis Date Noted  . Nontraumatic complete tear of left rotator cuff   . Impingement syndrome of left shoulder   . Diabetic neuropathy ([place]) 02/20/2019  . Severe persistent asthma 01/04/2019  . Subcortical microvascular ischemic occlusive disease 07/12/2018  . Rapid palpitations 07/12/2018  . Impingement of left ankle joint 06/26/2018  . Constipation 04/21/2018  . Anxiety 04/05/2018  . Total knee replacement status, left 12/07/2017  . Dysphagia 09/20/2017  . Epiglottitis   . Plantar fasciitis, right 07/13/2017  . S/P total knee arthroplasty, right 06/29/2017  . Osteoarthrosis, localized, primary, knee, right   . Sprain of calcaneofibular ligament of right ankle 06/06/2017  . Osteoarthritis of right knee 01/26/2017  . Onychomycosis 10/27/2015  . CKD (chronic kidney disease) stage 3, GFR 30-59 ml/min 04/27/2015  . MRSA (methicillin resistant staph aureus) culture positive 03/27/2015  . History of osteomyelitis 03/27/2015  . Fatty liver 09/30/2014  . Hot flashes 07/15/2014  . Abnormal SPEP 04/17/2014  . Fracture of left leg 04/17/2014  . Cushingoid side effect of steroids ([place]) 04/17/2014  . Internal hemorrhoids   .  Major depression in full remission ([place])   . Preoperative clearance 03/25/2014  . Obstructive chronic bronchitis without exacerbation ([place]) 09/18/2013  . Chest pain 04/11/2013  . Hypertensive heart disease with chronic diastolic congestive heart failure ([place])   . Solitary pulmonary nodule, on CT 02/2013 - stable over 2 years in 2015 02/20/2013  . Gout 08/20/2010  . Anemia 09/18/2009  . Morbid obesity ([place]) 06/04/2009  . Sleep apnea 04/21/2009  . Sarcoidosis of lung ([place]) 04/10/2007  . Hyperlipidemia associated with type 2 diabetes mellitus ([place]) 08/21/2006  . Hypertension associated with diabetes ([place]) 08/21/2006  . GERD 08/21/2006  . Type II diabetes mellitus with renal manifestations ([place]) 08/21/2006      Laureen Abrahams, PT, DPT 08/22/19 9:28 AM     [facility] Physical Therapy [address] [place], [name], [address] Phone: [phone]   Fax:  [phone]  Name: VELA SERAFINO MRN: [id number] Date of Birth: 1961-01-24

## 2019-08-27 ENCOUNTER — Encounter: Payer: Self-pay | Admitting: Physical Therapy

## 2019-08-27 ENCOUNTER — Other Ambulatory Visit: Payer: Self-pay

## 2019-08-27 ENCOUNTER — Ambulatory Visit (INDEPENDENT_AMBULATORY_CARE_PROVIDER_SITE_OTHER): Payer: 59 | Admitting: Physical Therapy

## 2019-08-27 DIAGNOSIS — R293 Abnormal posture: Secondary | ICD-10-CM | POA: Diagnosis not present

## 2019-08-27 DIAGNOSIS — M25612 Stiffness of left shoulder, not elsewhere classified: Secondary | ICD-10-CM | POA: Diagnosis not present

## 2019-08-27 DIAGNOSIS — M25512 Pain in left shoulder: Secondary | ICD-10-CM | POA: Diagnosis not present

## 2019-08-27 NOTE — Therapy (Signed)
[facility] [address] [place], [name], [address] Phone: [phone]   Fax:  [phone]  Physical Therapy Treatment  Patient Details  Name: [name] MRN: [id number] Date of Birth: 1961/06/08 Referring Provider (PT): Dr. Meridee Score   Encounter Date: 08/27/2019  PT End of Session - 08/27/19 0842    Visit Number  5    Number of Visits  12    Date for PT Re-Evaluation  09/25/19    PT Start Time  0806    PT Stop Time  0853    PT Time Calculation (min)  47 min    Activity Tolerance  Patient tolerated treatment well    Behavior During Therapy  [facility] for tasks assessed/performed       Past Medical History:  Diagnosis Date  . Abnormal SPEP 04/17/2014  . Acute left ankle pain 01/26/2017  . ANEMIA-UNSPECIFIED 09/18/2009  . Anxiety   . Arthritis   . Chronic diastolic heart failure, NYHA class 2 (HCC)    Normal LVEDP by May 2018  . COPD (chronic obstructive pulmonary disease) ([place])   . Depression   . DIABETES MELLITUS, TYPE II 08/21/2006  . Diabetic osteomyelitis ([place]) 05/29/2015  . Fracture of 5th metatarsal    non union  . GERD [id number]   Had fundoplication  . GOUT 08/20/2010  . Hammer toe of right foot    2-5th toes  . Hx of umbilical hernia repair   . HYPERLIPIDEMIA 08/21/2006  . HYPERTENSION 08/21/2006  . Infection of wound due to methicillin resistant Staphylococcus aureus (MRSA)   . Internal hemorrhoids   . Multiple allergies 10/14/2016  . OBESITY 06/04/2009  . Onychomycosis 10/27/2015  . Osteomyelitis of left foot ([place]) 05/29/2015  . Pulmonary sarcoidosis ([place])    Followed locally by pulmonology, but also by Dr. Casper Harrison at [facility]  . Right knee pain 01/26/2017  . Vocal cord dysfunction   . Wears partial dentures     Past Surgical History:  Procedure Laterality Date  . ABDOMINAL HYSTERECTOMY    . APPENDECTOMY    . BLADDER SUSPENSION  11/11/2011   Procedure: TRANSVAGINAL TAPE (TVT) PROCEDURE;  Surgeon:  Olga Millers, MD;  Location: [facility];  Service: Gynecology;  Laterality: N/A;  . CAROTIDS  02/18/11   CAROTID DUPLEX; VERTEBRALS ARE PATENT WITH ANTEGRADE FLOW. ICA/CCA RATIO 1.61 ON RIGHT AND 0.75 ON LEFT  . CHOLECYSTECTOMY  1984  . CYSTOSCOPY  11/11/2011   Procedure: CYSTOSCOPY;  Surgeon: Olga Millers, MD;  Location: [facility];  Service: Gynecology;  Laterality: N/A;  . EXTUBATION (ENDOTRACHEAL) IN OR N/A 09/23/2017   Procedure: EXTUBATION (ENDOTRACHEAL) IN OR;  Surgeon: Helayne Seminole, MD;  Location: [place];  Service: ENT;  Laterality: N/A;  . FIBEROPTIC LARYNGOSCOPY AND TRACHEOSCOPY N/A 09/23/2017   Procedure: FLEXIBLE FIBEROPTIC LARYNGOSCOPY;  Surgeon: Helayne Seminole, MD;  Location: [place];  Service: ENT;  Laterality: N/A;  . FRACTURE SURGERY     foot  . HALLUX FUSION Left 10/02/2018   Procedure: HALLUX ARTHRODESIS;  Surgeon: Edrick Kins, DPM;  Location: [facility];  Service: Podiatry;  Laterality: Left;  . HAMMER TOE SURGERY Left 10/02/2018   Procedure: HAMMER TOE CORRECTION 2ND 3RD 4RD FIFTH TOE;  Surgeon: Edrick Kins, DPM;  Location: [facility];  Service: Podiatry;  Laterality: Left;  . HAMMER TOE SURGERY Right 04/12/2019   Procedure: HAMMER TOE CORRECTION, 2ND, 3RD, 4TH AND 5TH TOES OF RIGHT FOOT;  Surgeon: Edrick Kins, DPM;  Location: [place];  Service: Podiatry;  Laterality: Right;  . HERNIA REPAIR    . I&D EXTREMITY Left 06/27/2015   Procedure: Partial Excision Left Calcaneus, Place Antibiotic Beads, and Wound VAC;  Surgeon: Newt Minion, MD;  Location: [place];  Service: Orthopedics;  Laterality: Left;  . KNEE ARTHROSCOPY     right  . LEFT AND RIGHT HEART CATHETERIZATION WITH CORONARY ANGIOGRAM N/A 04/23/2013   Procedure: LEFT AND RIGHT HEART CATHETERIZATION WITH CORONARY ANGIOGRAM;  Surgeon: Leonie Man, MD;  Location: [facility] C[facility];  Service: Cardiovascular;  Laterality: N/A;  . LEFT HEART CATH AND CORONARY ANGIOGRAPHY N/A 03/11/2017   Procedure: Left Heart Cath and  Coronary Angiography;  Surgeon: Sherren Mocha, MD;  Location: [place] [facility]; angiographically minimal CAD in the LAD otherwise normal.;  Normal LVEDP.  FALSE POSITIVE MYOVIEW  . LEFT HEART CATH AND CORONARY ANGIOGRAPHY  07/20/2010   LVEF 50-55% WITH VERY MILD GLOBAL HYPOKINESIA; ESSENTIALLY NORMAL CORONARY ARTERIES; NORMAL LV FUNCTION  . METATARSAL OSTEOTOMY WITH OPEN REDUCTION INTERNAL FIXATION (ORIF) METATARSAL WITH FUSION Left 04/09/2014   Procedure: LEFT FOOT FRACTURE OPEN TREATMENT METATARSAL INCLUDES INTERNAL FIXATION EACH;  Surgeon: Lorn Junes, MD;  Location: [place];  Service: Orthopedics;  Laterality: Left;  . NISSEN FUNDOPLICATION  [id number]  . NM MYOVIEW LTD  03/09/2013   Lexiscan --> EF 50%; NORMAL MYOCARDIAL PERFUSION STUDY - breast attenuation  . NM MYOVIEW LTD  03/10/2017   : Moderate size "stress-induced "perfusion defect at the apex as well as "ill-defined stress-induced perfusion defect in the lateral wall.  EF 55%.  INTERMEDIATE risk. -->  FALSE POSITIVE  . Right and left CARDIAC CATHETERIZATION  04/23/2013   Angiographic normal coronaries; LVEDP 20 mmHg, PCWP 12-14 mmHg, RAP 12 mmHg.; Fick CO/CI 4.9/2.2  . SHOULDER ARTHROSCOPY Left 03/14/2019   Procedure: LEFT SHOULDER ARTHROSCOPY, DEBRIDEMENT, AND DECOMPRESSION;  Surgeon: Newt Minion, MD;  Location: [place];  Service: Orthopedics;  Laterality: Left;  . TOTAL KNEE ARTHROPLASTY Right 06/29/2017   Procedure: RIGHT TOTAL KNEE ARTHROPLASTY;  Surgeon: Newt Minion, MD;  Location: [place];  Service: Orthopedics;  Laterality: Right;  . TOTAL KNEE ARTHROPLASTY Left 12/07/2017  . TOTAL KNEE ARTHROPLASTY Left 12/07/2017   Procedure: LEFT TOTAL KNEE ARTHROPLASTY;  Surgeon: Newt Minion, MD;  Location: [place];  Service: Orthopedics;  Laterality: Left;  . TRACHEOSTOMY TUBE PLACEMENT N/A 09/20/2017   Procedure: AWAKE INTUBATION WITH ANESTHESIA WITH VIDEO ASSISTANCE;  Surgeon: Helayne Seminole, MD;  Location: [place] [facility];   Service: ENT;  Laterality: N/A;  . TRANSTHORACIC ECHOCARDIOGRAM  08/2014   Normal LV size and function.  Mild LVH.  EF 55-60%.  Normal regional wall motion.  GR 1 DD.  Normal RV size and function .  [name] TUBAL LIGATION     with reversal in 1994  . VENTRAL HERNIA REPAIR      There were no vitals filed for this visit.  Subjective Assessment - 08/27/19 0810    Subjective  a little more sore this morning, but overall doing well without significant pain.    Patient Stated Goals  regain independence - be able to dress, drive, toileting independently    Currently in Pain?  No/denies    Pain Onset  1 to 4 weeks ago                       [facility] Adult PT Treatment/Exercise - 08/27/19 0811      Shoulder  Exercises: Supine   Flexion  AROM;15 reps;Left      Shoulder Exercises: Sidelying   External Rotation  AROM;Left;15 reps    ABduction  AROM;Left;15 reps      Shoulder Exercises: Standing   Flexion  AAROM;10 reps   eccentric lowering   ABduction  AAROM;10 reps   eccentric lowering   Extension  Both;15 reps;Theraband    Theraband Level (Shoulder Extension)  Level 1 (Yellow)    Row  Both;15 reps;Theraband    Theraband Level (Shoulder Row)  Level 1 (Yellow)    Row Limitations  5 sec hold    Other Standing Exercises  bicep curl #3 bar x 15 reps      Shoulder Exercises: Therapy Ball   Flexion  Both;15 reps    Scaption  Left;15 reps    Right/Left  15 reps      Shoulder Exercises: Isometric Strengthening   Flexion Limitations  10x5" supine    Extension Limitations  10x5" supine    External Rotation Limitations  10x5" sidelying    Internal Rotation Limitations  10x5" sidelying    ABduction Limitations  10x5" sidelying      Moist Heat Therapy   Number Minutes Moist Heat  12 Minutes    Moist Heat Location  Shoulder      Electrical Stimulation   Electrical Stimulation Location  Lt shoulder    Electrical Stimulation Action  IFC    Electrical Stimulation Parameters  to  tolerance    Electrical Stimulation Goals  Pain                  PT Long Term Goals - 08/14/19 1007      PT LONG TERM GOAL #1   Title  independent with HEP    Status  New    Target Date  09/25/19      PT LONG TERM GOAL #2   Title  improve Lt shoulder AROM abduction and external rotation by at least 15 deg for improved function    Status  New    Target Date  09/25/19      PT LONG TERM GOAL #3   Title  report independence with ADLs for improved function and independence    Status  New    Target Date  09/25/19      PT LONG TERM GOAL #4   Title  report pain < 5/10 with activity for improved function    Status  New    Target Date  09/25/19            Plan - 08/27/19 0843    Clinical Impression Statement  Pt tolerated session well today and overall improving with standing motion of Lt shoulder.  Still demonstrates shoulder shrug with active raise against gravity but improved with cues and focus on eccentric lowering.  Will continue to benefit from PT to maximize function.    Personal Factors and Comorbidities  Comorbidity 3+    Comorbidities  pulmonary sardoidosis, obesity, HTN, DM, depression, COPD, CHF, anxiety, Lt TKA    Examination-Activity Limitations  Bathing;Reach Overhead;Bed Mobility;Carry;Sleep;Dressing;Hygiene/Grooming;Toileting    Examination-Participation Restrictions  Cleaning;Meal Prep;Driving    Stability/Clinical Decision Making  Stable/Uncomplicated    Rehab Potential  Good    PT Frequency  2x / week    PT Duration  6 weeks    PT Treatment/Interventions  ADLs/Self Care Home Management;Cryotherapy;Electrical Stimulation;Ultrasound;Moist Heat;Iontophoresis 4mg /ml Dexamethasone;Functional mobility training;Therapeutic activities;Therapeutic exercise;Patient/family education;Neuromuscular re-education;Manual techniques;Passive range of motion;Vasopneumatic Device;Taping;Dry needling    PT  Next Visit Plan  cont scap strengthening activities, modalities  PRN; A/AA ROM exercises    PT Home Exercise Plan  Access Code: [email or web address]    Consulted and Agree with Plan of Care  Patient       Patient will benefit from skilled therapeutic intervention in order to improve the following deficits and impairments:  Decreased range of motion, Increased fascial restricitons, Pain, Impaired UE functional use, Decreased strength, Postural dysfunction  Visit Diagnosis: Acute pain of left shoulder  Abnormal posture  Stiffness of left shoulder joint     Problem List Patient Active Problem List   Diagnosis Date Noted  . Nontraumatic complete tear of left rotator cuff   . Impingement syndrome of left shoulder   . Diabetic neuropathy ([place]) 02/20/2019  . Severe persistent asthma 01/04/2019  . Subcortical microvascular ischemic occlusive disease 07/12/2018  . Rapid palpitations 07/12/2018  . Impingement of left ankle joint 06/26/2018  . Constipation 04/21/2018  . Anxiety 04/05/2018  . Total knee replacement status, left 12/07/2017  . Dysphagia 09/20/2017  . Epiglottitis   . Plantar fasciitis, right 07/13/2017  . S/P total knee arthroplasty, right 06/29/2017  . Osteoarthrosis, localized, primary, knee, right   . Sprain of calcaneofibular ligament of right ankle 06/06/2017  . Osteoarthritis of right knee 01/26/2017  . Onychomycosis 10/27/2015  . CKD (chronic kidney disease) stage 3, GFR 30-59 ml/min 04/27/2015  . MRSA (methicillin resistant staph aureus) culture positive 03/27/2015  . History of osteomyelitis 03/27/2015  . Fatty liver 09/30/2014  . Hot flashes 07/15/2014  . Abnormal SPEP 04/17/2014  . Fracture of left leg 04/17/2014  . Cushingoid side effect of steroids ([place]) 04/17/2014  . Internal hemorrhoids   . Major depression in full remission ([place])   . Preoperative clearance 03/25/2014  . Obstructive chronic bronchitis without exacerbation ([place]) 09/18/2013  . Chest pain 04/11/2013  . Hypertensive heart disease with chronic diastolic  congestive heart failure ([place])   . Solitary pulmonary nodule, on CT 02/2013 - stable over 2 years in 2015 02/20/2013  . Gout 08/20/2010  . Anemia 09/18/2009  . Morbid obesity ([place]) 06/04/2009  . Sleep apnea 04/21/2009  . Sarcoidosis of lung ([place]) 04/10/2007  . Hyperlipidemia associated with type 2 diabetes mellitus ([place]) 08/21/2006  . Hypertension associated with diabetes ([place]) 08/21/2006  . GERD 08/21/2006  . Type II diabetes mellitus with renal manifestations ([place]) 08/21/2006      Laureen Abrahams, PT, DPT 08/27/19 8:45 AM     [facility] [address] [place], [name], [address] Phone: [phone]   Fax:  [phone]  Name: KIMALA MCCLEESE MRN: [id number] Date of Birth: 1961/05/14

## 2019-08-29 ENCOUNTER — Encounter: Payer: Self-pay | Admitting: Physical Therapy

## 2019-08-29 ENCOUNTER — Ambulatory Visit (INDEPENDENT_AMBULATORY_CARE_PROVIDER_SITE_OTHER): Payer: 59 | Admitting: Physical Therapy

## 2019-08-29 ENCOUNTER — Other Ambulatory Visit: Payer: Self-pay

## 2019-08-29 DIAGNOSIS — M25612 Stiffness of left shoulder, not elsewhere classified: Secondary | ICD-10-CM

## 2019-08-29 DIAGNOSIS — R293 Abnormal posture: Secondary | ICD-10-CM | POA: Diagnosis not present

## 2019-08-29 DIAGNOSIS — M25512 Pain in left shoulder: Secondary | ICD-10-CM | POA: Diagnosis not present

## 2019-08-29 NOTE — Therapy (Signed)
[facility] [address] [place], [name], [address] Phone: [phone]   Fax:  [phone]  Physical Therapy Treatment  Patient Details  Name: [name] MRN: [id number] Date of Birth: 11-30-1960 Referring Provider (PT): Dr. Meridee Score   Encounter Date: 08/29/2019  PT End of Session - 08/29/19 0929    Visit Number  6    Number of Visits  12    Date for PT Re-Evaluation  09/25/19    PT Start Time  0845    PT Stop Time  0935    PT Time Calculation (min)  50 min    Activity Tolerance  Patient tolerated treatment well    Behavior During Therapy  [facility] for tasks assessed/performed       Past Medical History:  Diagnosis Date  . Abnormal SPEP 04/17/2014  . Acute left ankle pain 01/26/2017  . ANEMIA-UNSPECIFIED 09/18/2009  . Anxiety   . Arthritis   . Chronic diastolic heart failure, NYHA class 2 (HCC)    Normal LVEDP by May 2018  . COPD (chronic obstructive pulmonary disease) ([place])   . Depression   . DIABETES MELLITUS, TYPE II 08/21/2006  . Diabetic osteomyelitis ([place]) 05/29/2015  . Fracture of 5th metatarsal    non union  . GERD [id number]   Had fundoplication  . GOUT 08/20/2010  . Hammer toe of right foot    2-5th toes  . Hx of umbilical hernia repair   . HYPERLIPIDEMIA 08/21/2006  . HYPERTENSION 08/21/2006  . Infection of wound due to methicillin resistant Staphylococcus aureus (MRSA)   . Internal hemorrhoids   . Multiple allergies 10/14/2016  . OBESITY 06/04/2009  . Onychomycosis 10/27/2015  . Osteomyelitis of left foot ([place]) 05/29/2015  . Pulmonary sarcoidosis ([place])    Followed locally by pulmonology, but also by Dr. Casper Harrison at [facility]  . Right knee pain 01/26/2017  . Vocal cord dysfunction   . Wears partial dentures     Past Surgical History:  Procedure Laterality Date  . ABDOMINAL HYSTERECTOMY    . APPENDECTOMY    . BLADDER SUSPENSION  11/11/2011   Procedure: TRANSVAGINAL TAPE (TVT) PROCEDURE;  Surgeon:  Olga Millers, MD;  Location: [place] [facility];  Service: Gynecology;  Laterality: N/A;  . CAROTIDS  02/18/11   CAROTID DUPLEX; VERTEBRALS ARE PATENT WITH ANTEGRADE FLOW. ICA/CCA RATIO 1.61 ON RIGHT AND 0.75 ON LEFT  . CHOLECYSTECTOMY  1984  . CYSTOSCOPY  11/11/2011   Procedure: CYSTOSCOPY;  Surgeon: Olga Millers, MD;  Location: [place] [facility];  Service: Gynecology;  Laterality: N/A;  . EXTUBATION (ENDOTRACHEAL) IN OR N/A 09/23/2017   Procedure: EXTUBATION (ENDOTRACHEAL) IN OR;  Surgeon: Helayne Seminole, MD;  Location: [place];  Service: ENT;  Laterality: N/A;  . FIBEROPTIC LARYNGOSCOPY AND TRACHEOSCOPY N/A 09/23/2017   Procedure: FLEXIBLE FIBEROPTIC LARYNGOSCOPY;  Surgeon: Helayne Seminole, MD;  Location: [place];  Service: ENT;  Laterality: N/A;  . FRACTURE SURGERY     foot  . HALLUX FUSION Left 10/02/2018   Procedure: HALLUX ARTHRODESIS;  Surgeon: Edrick Kins, DPM;  Location: [facility];  Service: Podiatry;  Laterality: Left;  . HAMMER TOE SURGERY Left 10/02/2018   Procedure: HAMMER TOE CORRECTION 2ND 3RD 4RD FIFTH TOE;  Surgeon: Edrick Kins, DPM;  Location: [facility];  Service: Podiatry;  Laterality: Left;  . HAMMER TOE SURGERY Right 04/12/2019   Procedure: HAMMER TOE CORRECTION, 2ND, 3RD, 4TH AND 5TH TOES OF RIGHT FOOT;  Surgeon: Edrick Kins, DPM;  Location: [place];  Service: Podiatry;  Laterality: Right;  . HERNIA REPAIR    . I&D EXTREMITY Left 06/27/2015   Procedure: Partial Excision Left Calcaneus, Place Antibiotic Beads, and Wound VAC;  Surgeon: Newt Minion, MD;  Location: [place];  Service: Orthopedics;  Laterality: Left;  . KNEE ARTHROSCOPY     right  . LEFT AND RIGHT HEART CATHETERIZATION WITH CORONARY ANGIOGRAM N/A 04/23/2013   Procedure: LEFT AND RIGHT HEART CATHETERIZATION WITH CORONARY ANGIOGRAM;  Surgeon: Leonie Man, MD;  Location: [facility] C[facility];  Service: Cardiovascular;  Laterality: N/A;  . LEFT HEART CATH AND CORONARY ANGIOGRAPHY N/A 03/11/2017   Procedure: Left Heart Cath and  Coronary Angiography;  Surgeon: Sherren Mocha, MD;  Location: [place] [facility] LAB; angiographically minimal CAD in the LAD otherwise normal.;  Normal LVEDP.  FALSE POSITIVE MYOVIEW  . LEFT HEART CATH AND CORONARY ANGIOGRAPHY  07/20/2010   LVEF 50-55% WITH VERY MILD GLOBAL HYPOKINESIA; ESSENTIALLY NORMAL CORONARY ARTERIES; NORMAL LV FUNCTION  . METATARSAL OSTEOTOMY WITH OPEN REDUCTION INTERNAL FIXATION (ORIF) METATARSAL WITH FUSION Left 04/09/2014   Procedure: LEFT FOOT FRACTURE OPEN TREATMENT METATARSAL INCLUDES INTERNAL FIXATION EACH;  Surgeon: Lorn Junes, MD;  Location: [place];  Service: Orthopedics;  Laterality: Left;  . NISSEN FUNDOPLICATION  [id number]  . NM MYOVIEW LTD  03/09/2013   Lexiscan --> EF 50%; NORMAL MYOCARDIAL PERFUSION STUDY - breast attenuation  . NM MYOVIEW LTD  03/10/2017   : Moderate size "stress-induced "perfusion defect at the apex as well as "ill-defined stress-induced perfusion defect in the lateral wall.  EF 55%.  INTERMEDIATE risk. -->  FALSE POSITIVE  . Right and left CARDIAC CATHETERIZATION  04/23/2013   Angiographic normal coronaries; LVEDP 20 mmHg, PCWP 12-14 mmHg, RAP 12 mmHg.; Fick CO/CI 4.9/2.2  . SHOULDER ARTHROSCOPY Left 03/14/2019   Procedure: LEFT SHOULDER ARTHROSCOPY, DEBRIDEMENT, AND DECOMPRESSION;  Surgeon: Newt Minion, MD;  Location: [place];  Service: Orthopedics;  Laterality: Left;  . TOTAL KNEE ARTHROPLASTY Right 06/29/2017   Procedure: RIGHT TOTAL KNEE ARTHROPLASTY;  Surgeon: Newt Minion, MD;  Location: [place];  Service: Orthopedics;  Laterality: Right;  . TOTAL KNEE ARTHROPLASTY Left 12/07/2017  . TOTAL KNEE ARTHROPLASTY Left 12/07/2017   Procedure: LEFT TOTAL KNEE ARTHROPLASTY;  Surgeon: Newt Minion, MD;  Location: [place];  Service: Orthopedics;  Laterality: Left;  . TRACHEOSTOMY TUBE PLACEMENT N/A 09/20/2017   Procedure: AWAKE INTUBATION WITH ANESTHESIA WITH VIDEO ASSISTANCE;  Surgeon: Helayne Seminole, MD;  Location: [place] [facility];   Service: ENT;  Laterality: N/A;  . TRANSTHORACIC ECHOCARDIOGRAM  08/2014   Normal LV size and function.  Mild LVH.  EF 55-60%.  Normal regional wall motion.  GR 1 DD.  Normal RV size and function .  [name] TUBAL LIGATION     with reversal in 1994  . VENTRAL HERNIA REPAIR      There were no vitals filed for this visit.  Subjective Assessment - 08/29/19 0846    Subjective  doing well; no pain    Patient Stated Goals  regain independence - be able to dress, drive, toileting independently    Currently in Pain?  No/denies    Pain Onset  1 to 4 weeks ago                       [facility] Adult PT Treatment/Exercise - 08/29/19 0847      Elbow Exercises   Other elbow exercises  biceps stretch  3x30 sec; left - mod cues needed      Shoulder Exercises: Standing   Flexion  AAROM;10 reps    Flexion Limitations  eccentric lowering    Other Standing Exercises  AA scaption; eccentric lowering      Shoulder Exercises: Pulleys   Flexion  3 minutes    Scaption  3 minutes      Shoulder Exercises: Therapy Ball   Flexion  Both;15 reps    Scaption  Left;15 reps    Right/Left  15 reps      Shoulder Exercises: Stretch   Internal Rotation Stretch  10 seconds   10 reps; behind back     Moist Heat Therapy   Number Minutes Moist Heat  12 Minutes    Moist Heat Location  Shoulder      Electrical Stimulation   Electrical Stimulation Location  Lt shoulder    Electrical Stimulation Action  IFC    Electrical Stimulation Parameters  to tolerance    Electrical Stimulation Goals  Pain      Manual Therapy   Manual Therapy  Soft tissue mobilization    Soft tissue mobilization  IASTM to Lt biceps                  PT Long Term Goals - 08/14/19 1007      PT LONG TERM GOAL #1   Title  independent with HEP    Status  New    Target Date  09/25/19      PT LONG TERM GOAL #2   Title  improve Lt shoulder AROM abduction and external rotation by at least 15 deg for improved function     Status  New    Target Date  09/25/19      PT LONG TERM GOAL #3   Title  report independence with ADLs for improved function and independence    Status  New    Target Date  09/25/19      PT LONG TERM GOAL #4   Title  report pain < 5/10 with activity for improved function    Status  New    Target Date  09/25/19            Plan - 08/29/19 1055    Clinical Impression Statement  Pt overall progressing well with PT, and still needs cues to decrease shoulder shrug with active standing movements.  Will continue to benefit from PT to maximize function.    Personal Factors and Comorbidities  Comorbidity 3+    Comorbidities  pulmonary sardoidosis, obesity, HTN, DM, depression, COPD, CHF, anxiety, Lt TKA    Examination-Activity Limitations  Bathing;Reach Overhead;Bed Mobility;Carry;Sleep;Dressing;Hygiene/Grooming;Toileting    Examination-Participation Restrictions  Cleaning;Meal Prep;Driving    Stability/Clinical Decision Making  Stable/Uncomplicated    Rehab Potential  Good    PT Frequency  2x / week    PT Duration  6 weeks    PT Treatment/Interventions  ADLs/Self Care Home Management;Cryotherapy;Electrical Stimulation;Ultrasound;Moist Heat;Iontophoresis 4mg /ml Dexamethasone;Functional mobility training;Therapeutic activities;Therapeutic exercise;Patient/family education;Neuromuscular re-education;Manual techniques;Passive range of motion;Vasopneumatic Device;Taping;Dry needling    PT Next Visit Plan  cont scap strengthening activities, modalities PRN; A/AA ROM exercises    PT Home Exercise Plan  Access Code: [email or web address]    Consulted and Agree with Plan of Care  Patient       Patient will benefit from skilled therapeutic intervention in order to improve the following deficits and impairments:  Decreased range of motion, Increased fascial restricitons, Pain, Impaired UE functional use, Decreased strength,  Postural dysfunction  Visit Diagnosis: Acute pain of left shoulder  Abnormal  posture  Stiffness of left shoulder joint     Problem List Patient Active Problem List   Diagnosis Date Noted  . Nontraumatic complete tear of left rotator cuff   . Impingement syndrome of left shoulder   . Diabetic neuropathy ([place]) 02/20/2019  . Severe persistent asthma 01/04/2019  . Subcortical microvascular ischemic occlusive disease 07/12/2018  . Rapid palpitations 07/12/2018  . Impingement of left ankle joint 06/26/2018  . Constipation 04/21/2018  . Anxiety 04/05/2018  . Total knee replacement status, left 12/07/2017  . Dysphagia 09/20/2017  . Epiglottitis   . Plantar fasciitis, right 07/13/2017  . S/P total knee arthroplasty, right 06/29/2017  . Osteoarthrosis, localized, primary, knee, right   . Sprain of calcaneofibular ligament of right ankle 06/06/2017  . Osteoarthritis of right knee 01/26/2017  . Onychomycosis 10/27/2015  . CKD (chronic kidney disease) stage 3, GFR 30-59 ml/min 04/27/2015  . MRSA (methicillin resistant staph aureus) culture positive 03/27/2015  . History of osteomyelitis 03/27/2015  . Fatty liver 09/30/2014  . Hot flashes 07/15/2014  . Abnormal SPEP 04/17/2014  . Fracture of left leg 04/17/2014  . Cushingoid side effect of steroids ([place]) 04/17/2014  . Internal hemorrhoids   . Major depression in full remission ([place])   . Preoperative clearance 03/25/2014  . Obstructive chronic bronchitis without exacerbation ([place]) 09/18/2013  . Chest pain 04/11/2013  . Hypertensive heart disease with chronic diastolic congestive heart failure ([place])   . Solitary pulmonary nodule, on CT 02/2013 - stable over 2 years in 2015 02/20/2013  . Gout 08/20/2010  . Anemia 09/18/2009  . Morbid obesity ([place]) 06/04/2009  . Sleep apnea 04/21/2009  . Sarcoidosis of lung ([place]) 04/10/2007  . Hyperlipidemia associated with type 2 diabetes mellitus ([place]) 08/21/2006  . Hypertension associated with diabetes ([place]) 08/21/2006  . GERD 08/21/2006  . Type II diabetes mellitus with  renal manifestations ([place]) 08/21/2006      Laureen Abrahams, PT, DPT 08/29/19 11:38 AM     [facility] [address] [place], [name], [address] Phone: [phone]   Fax:  [phone]  Name: [name] MRN: [id number] Date of Birth: January 27, 1961

## 2019-08-30 ENCOUNTER — Encounter: Payer: Self-pay | Admitting: Cardiology

## 2019-08-30 ENCOUNTER — Ambulatory Visit (INDEPENDENT_AMBULATORY_CARE_PROVIDER_SITE_OTHER): Payer: 59 | Admitting: Cardiology

## 2019-08-30 VITALS — BP 138/74 | HR 81 | Temp 97.8°F | Ht 66.0 in | Wt 258.0 lb

## 2019-08-30 DIAGNOSIS — I5032 Chronic diastolic (congestive) heart failure: Secondary | ICD-10-CM | POA: Diagnosis not present

## 2019-08-30 DIAGNOSIS — E1169 Type 2 diabetes mellitus with other specified complication: Secondary | ICD-10-CM | POA: Diagnosis not present

## 2019-08-30 DIAGNOSIS — I110 Hypertensive heart disease with heart failure: Secondary | ICD-10-CM | POA: Diagnosis not present

## 2019-08-30 DIAGNOSIS — Z01818 Encounter for other preprocedural examination: Secondary | ICD-10-CM

## 2019-08-30 DIAGNOSIS — R002 Palpitations: Secondary | ICD-10-CM

## 2019-08-30 DIAGNOSIS — E785 Hyperlipidemia, unspecified: Secondary | ICD-10-CM

## 2019-08-30 MED ORDER — VASCEPA 1 G PO CAPS: 2.0000 g | ORAL_CAPSULE | Freq: Two times a day (BID) | ORAL | 11 refills | Status: DC

## 2019-08-30 NOTE — Patient Instructions (Signed)
Medication Instructions:   will try  Vascepa  2 capsule twice a day instead of fenofibrate *If you need a refill on your cardiac medications before your next appointment, please call your pharmacy*  Lab Work: Not needed   Testing/Procedures: Not needed  Follow-Up: At [facility], you and your health needs are our priority.  As part of our continuing mission to provide you with exceptional heart care, we have created designated Provider Care Teams.  These Care Teams include your primary Cardiologist (physician) and Advanced Practice Providers (APPs -  Physician Assistants and Nurse Practitioners) who all work together to provide you with the care you need, when you need it.  Your next appointment:   12 month(s)  The format for your next appointment:   In Person  Provider:   [name], MD  Other Instructions

## 2019-08-30 NOTE — Progress Notes (Signed)
Primary Care Provider: [name], MD Cardiologist: [name], MD Electrophysiologist:  Pulmonolgy: Dr. [facility]) ; [place] - Dr. Concepcion Living  Clinic Note: Chief Complaint  Patient presents with   Follow-up    12 months.    HPI:    [name] is a 58 y.o. female with a h/o mild HTN Heart Disease & longstanding pulmonary (and ocular) sarcoidosis/asthma who presents today for ~annual f/u.[name]   2014 - RIGHT AND LEFT HEART CATH --> angiographic normal coronary arteries with essentially normal right heart pressures..   Follow-up cath in 2018 -angiographically normal with mild nonobstructive disease in the LAD.  Normal LVEDP.  KHALA [name] was last seen on in Dec 2019 -> f/u of event monitor to assess palpitations.   Recent Hospitalizations: ? Hammer-toe Sgx. Rescheduled after she fell & broker her R arm a few weeks ago.   July 22, 2019 had a fall with fracture of the left humerus  Left shoulder arthroscopic surgery June 2020  Reviewed  CV studies:    The following studies were reviewed today: (if available, images/films reviewed: From Epic Chart or Care Everywhere)  none  Interval History:   [name] returns today overall feeling quite well.  She looks and feels younger.  Has a new hair style.  Has more energy.  Unfortunately some her activity level and exercise has been reduced because of her recent fall and arm fracture.  She is also now pending hammertoe surgery in January.  Other than that, she has not had any recent exacerbations of her pulmonary disease it would appear that her sarcoid has maintained its dormant state.  She is not really having any cardiac symptoms to speak of.  Just occasional skipped beats here and there but the palpitations seem to be doing well with current dose of carvedilol plus diltiazem.  CV Review of Symptoms (Summary) no chest pain or dyspnea on exertion positive for - Rare, well-controlled  palpitations negative for - orthopnea, paroxysmal nocturnal dyspnea, rapid heart rate, shortness of breath or Edema only notable when she has increased doses of prednisone.  Otherwise stable.  No claudication.  No TIA/amaurosis fugax,syncope/near syncope.  The patient does not have symptoms concerning for COVID-19 infection (fever, chills, cough, or new shortness of breath).  The patient is practicing social distancing. ++ Masking.  - Orders on-line - so does not go out Groceries/shopping.    REVIEWED OF SYSTEMS   A comprehensive ROS was performed. Review of Systems  Constitutional: Negative for malaise/fatigue and weight loss.  HENT: Negative for congestion and nosebleeds.   Respiratory: Positive for shortness of breath (Pretty much at baseline.  Stable). Negative for cough and wheezing.   Cardiovascular:       Per HPI  Gastrointestinal: Negative for abdominal pain, blood in stool, heartburn and melena.  Genitourinary: Negative for hematuria.  Musculoskeletal: Positive for joint pain (L shoulder/upper arm and her hammertoe.). Negative for falls.  Neurological: Negative for dizziness.  Psychiatric/Behavioral: Negative.  Negative for memory loss. The patient is not nervous/anxious and does not have insomnia.    I have reviewed and (if needed) personally updated the patient's problem list, medications, allergies, past medical and surgical history, social and family history.   PAST MEDICAL HISTORY   Past Medical History:  Diagnosis Date   Abnormal SPEP 04/17/2014   Acute left ankle pain 01/26/2017   ANEMIA-UNSPECIFIED 09/18/2009   Anxiety    Arthritis    Chronic diastolic heart failure, NYHA class 2 (  [place])    Normal LVEDP by May 2018   COPD (chronic obstructive pulmonary disease) ([place])    Depression    DIABETES MELLITUS, TYPE II 08/21/2006   Diabetic osteomyelitis ([place]) 05/29/2015   Fracture of 5th metatarsal    non union   GERD [id number]   Had fundoplication   GOUT  [id number]   Hammer toe of right foot    2-5th toes   Hx of umbilical hernia repair    HYPERLIPIDEMIA 08/21/2006   HYPERTENSION 08/21/2006   Infection of wound due to methicillin resistant Staphylococcus aureus (MRSA)    Internal hemorrhoids    Multiple allergies 10/14/2016   OBESITY 06/04/2009   Onychomycosis 10/27/2015   Osteomyelitis of left foot ([place]) 05/29/2015   Pulmonary sarcoidosis ([place])    Followed locally by pulmonology, but also by Dr. Casper Harrison at [facility]   Right knee pain 01/26/2017   Vocal cord dysfunction    Wears partial dentures      PAST SURGICAL HISTORY   Past Surgical History:  Procedure Laterality Date   ABDOMINAL HYSTERECTOMY     APPENDECTOMY     BLADDER SUSPENSION  11/11/2011   Procedure: TRANSVAGINAL TAPE (TVT) PROCEDURE;  Surgeon: Olga Millers, MD;  Location: [facility];  Service: Gynecology;  Laterality: N/A;   CAROTIDS  02/18/11   CAROTID DUPLEX; VERTEBRALS ARE PATENT WITH ANTEGRADE FLOW. ICA/CCA RATIO 1.61 ON RIGHT AND 0.75 ON LEFT   CHOLECYSTECTOMY  1984   CYSTOSCOPY  11/11/2011   Procedure: CYSTOSCOPY;  Surgeon: Olga Millers, MD;  Location: [facility];  Service: Gynecology;  Laterality: N/A;   EXTUBATION (ENDOTRACHEAL) IN OR N/A 09/23/2017   Procedure: EXTUBATION (ENDOTRACHEAL) IN OR;  Surgeon: Helayne Seminole, MD;  Location: [facility];  Service: ENT;  Laterality: N/A;   FIBEROPTIC LARYNGOSCOPY AND TRACHEOSCOPY N/A 09/23/2017   Procedure: FLEXIBLE FIBEROPTIC LARYNGOSCOPY;  Surgeon: Helayne Seminole, MD;  Location: [facility];  Service: ENT;  Laterality: N/A;   FRACTURE SURGERY     foot   [place] Left 10/02/2018   Procedure: HALLUX ARTHRODESIS;  Surgeon: [place] Kins, DPM;  Location: [facility];  Service: Podiatry;  Laterality: Left;   HAMMER TOE SURGERY Left 10/02/2018   Procedure: HAMMER TOE CORRECTION 2ND 3RD 4RD FIFTH TOE;  Surgeon: [place] Kins, DPM;  Location: [facility];  Service: Podiatry;  Laterality: Left;    HAMMER TOE SURGERY Right 04/12/2019   Procedure: HAMMER TOE CORRECTION, 2ND, 3RD, 4TH AND 5TH TOES OF RIGHT FOOT;  Surgeon: [place] Kins, DPM;  Location: [facility];  Service: Podiatry;  Laterality: Right;   HERNIA REPAIR     I&D EXTREMITY Left 06/27/2015   Procedure: Partial Excision Left Calcaneus, Place Antibiotic Beads, and Wound VAC;  Surgeon: Newt Minion, MD;  Location: [facility];  Service: Orthopedics;  Laterality: Left;   KNEE ARTHROSCOPY     right   LEFT AND RIGHT HEART CATHETERIZATION WITH CORONARY ANGIOGRAM N/A 04/23/2013   Procedure: LEFT AND RIGHT HEART CATHETERIZATION WITH CORONARY ANGIOGRAM;  Surgeon: Leonie Man, MD;  Location: [facility];  Service: Cardiovascular;  Laterality: N/A;   LEFT HEART CATH AND CORONARY ANGIOGRAPHY N/A 03/11/2017   Procedure: Left Heart Cath and Coronary Angiography;  Surgeon: Sherren Mocha, MD;  Location: [facility]; angiographically minimal CAD in the LAD otherwise normal.;  Normal LVEDP.  FALSE POSITIVE MYOVIEW   LEFT HEART CATH AND CORONARY ANGIOGRAPHY  07/20/2010   LVEF 50-55% WITH VERY MILD GLOBAL HYPOKINESIA; ESSENTIALLY  NORMAL CORONARY ARTERIES; NORMAL LV FUNCTION   METATARSAL OSTEOTOMY WITH OPEN REDUCTION INTERNAL FIXATION (ORIF) METATARSAL WITH FUSION Left 04/09/2014   Procedure: LEFT FOOT FRACTURE OPEN TREATMENT METATARSAL INCLUDES INTERNAL FIXATION EACH;  Surgeon: Lorn Junes, MD;  Location: [facility];  Service: Orthopedics;  Laterality: Left;   NISSEN FUNDOPLICATION  [id number]   NM MYOVIEW LTD  03/09/2013   Lexiscan --> EF 50%; NORMAL MYOCARDIAL PERFUSION STUDY - breast attenuation   NM MYOVIEW LTD  03/10/2017   : Moderate size "stress-induced "perfusion defect at the apex as well as "ill-defined stress-induced perfusion defect in the lateral wall.  EF 55%.  INTERMEDIATE risk. -->  FALSE POSITIVE   Right and left CARDIAC CATHETERIZATION  04/23/2013   Angiographic normal coronaries; LVEDP 20 mmHg, PCWP 12-14  mmHg, RAP 12 mmHg.; Fick CO/CI 4.9/2.2   SHOULDER ARTHROSCOPY Left 03/14/2019   Procedure: LEFT SHOULDER ARTHROSCOPY, DEBRIDEMENT, AND DECOMPRESSION;  Surgeon: Newt Minion, MD;  Location: [facility];  Service: Orthopedics;  Laterality: Left;   TOTAL KNEE ARTHROPLASTY Right 06/29/2017   Procedure: RIGHT TOTAL KNEE ARTHROPLASTY;  Surgeon: Newt Minion, MD;  Location: [facility];  Service: Orthopedics;  Laterality: Right;   TOTAL KNEE ARTHROPLASTY Left 12/07/2017   TOTAL KNEE ARTHROPLASTY Left 12/07/2017   Procedure: LEFT TOTAL KNEE ARTHROPLASTY;  Surgeon: Newt Minion, MD;  Location: [facility];  Service: Orthopedics;  Laterality: Left;   TRACHEOSTOMY TUBE PLACEMENT N/A 09/20/2017   Procedure: AWAKE INTUBATION WITH ANESTHESIA WITH VIDEO ASSISTANCE;  Surgeon: Helayne Seminole, MD;  Location: [facility];  Service: ENT;  Laterality: N/A;   TRANSTHORACIC ECHOCARDIOGRAM  08/2014   Normal LV size and function.  Mild LVH.  EF 55-60%.  Normal regional wall motion.  GR 1 DD.  Normal RV size and function .   TUBAL LIGATION     with reversal in [place]       MEDICATIONS/ALLERGIES   Current Meds  Medication Sig   albuterol (PROVENTIL HFA;VENTOLIN HFA) 108 (90 Base) MCG/ACT inhaler Inhale 1-2 puffs into the lungs every 6 (six) hours as needed for wheezing or shortness of breath.   allopurinol (ZYLOPRIM) 100 MG tablet Take 100 mg by mouth every evening.    aspirin EC 81 MG tablet Take 81 mg by mouth daily.    atorvastatin (LIPITOR) 40 MG tablet TAKE 1 TABLET BY MOUTH EVERY DAY   BREO ELLIPTA 200-25 MCG/INH AEPB TAKE 1 PUFF BY MOUTH EVERY DAY   buPROPion (WELLBUTRIN XL) 300 MG 24 hr tablet TAKE 1 TABLET BY MOUTH EVERY DAY (Patient taking differently: Take 300 mg by mouth daily. )   carvedilol (COREG) 25 MG tablet TAKE 1 TABLET (25 MG TOTAL) BY MOUTH 2 (TWO) TIMES DAILY WITH A MEAL.   cholecalciferol (VITAMIN D) 25 MCG (1000 UT) tablet Take 1,000 Units by mouth 2 (two) times a day.    dexlansoprazole (DEXILANT) 60 MG capsule Take 1 capsule (60 mg total) by mouth daily.   diltiazem (CARDIZEM CD) 120 MG 24 hr capsule Take 1 capsule (120 mg total) by mouth daily.   famotidine (PEPCID) 20 MG tablet Take 1 tablet (20 mg total) by mouth at bedtime.   fenofibrate 160 MG tablet TAKE 1 TABLET BY MOUTH EVERY DAY   fluticasone (FLONASE) 50 MCG/ACT nasal spray PLACE 2 SPRAYS INTO BOTH NOSTRILS DAILY AS NEEDED FOR ALLERGIES   furosemide (LASIX) 40 MG tablet TAKE 1 TABLET BY MOUTH TWICE A DAY   gabapentin (NEURONTIN)  100 MG capsule TAKE 1 CAPSULE BY MOUTH THREE TIMES A DAY (Patient taking differently: Take 100-200 mg by mouth See admin instructions. Take 1 capsule (100 mg) by mouth in the morning & 2 capsules (200 mg) by mouth at night.)   glucose blood (ONETOUCH VERIO) test strip 1 each by Other route 2 (two) times daily. Use as instructed   HYDROcodone-acetaminophen (NORCO/VICODIN) 5-325 MG tablet Take 1 tablet by mouth every 6 (six) hours as needed for moderate pain.   Insulin Glargine (LANTUS SOLOSTAR) 100 UNIT/ML Solostar Pen Inject 60 Units into the skin daily.   insulin lispro (HUMALOG KWIKPEN) 100 UNIT/ML KwikPen Inject 0.3 mLs (30 Units total) into the skin daily with supper.   Insulin Pen Needle 33G X 4 MM MISC 1 each by Does not apply route 4 (four) times daily.   isosorbide mononitrate (IMDUR) 30 MG 24 hr tablet Take 1 tablet (30 mg total) by mouth at bedtime.   KLOR-CON M20 20 MEQ tablet TAKE 1 & 1/2 TABLETS BY MOUTH TWICE A DAY (Patient taking differently: Take 30 mEq by mouth 2 (two) times a day. )   lactulose (CHRONULAC) 10 GM/15ML solution Take 10 g by mouth daily.    linaclotide (LINZESS) 290 MCG CAPS capsule Take 1 capsule (290 mcg total) by mouth daily before breakfast.   LORazepam (ATIVAN) 0.5 MG tablet Take 0.5 mg by mouth every 12 (twelve) hours as needed for anxiety.   metFORMIN (GLUCOPHAGE) 1000 MG tablet TAKE 1 TABLET BY MOUTH 2 TIMES DAILY WITH A  MEAL. MUST CALL TO SCHEDULE AN APPT.   naproxen (NAPROSYN) 500 MG tablet Take 1 tablet (500 mg total) by mouth 2 (two) times daily with a meal. (Patient taking differently: Take 500 mg by mouth 2 (two) times daily as needed (pain). )   nitroGLYCERIN (NITROSTAT) 0.4 MG SL tablet Place 1 tablet (0.4 mg total) under the tongue every 5 (five) minutes as needed for chest pain. Reported on 01/05/2016   ondansetron (ZOFRAN-ODT) 4 MG disintegrating tablet Take 1 tablet (4 mg total) by mouth every 8 (eight) hours as needed for nausea or vomiting.   OneTouch Delica Lancets 99991111 MISC 1 each by Does not apply route 2 (two) times a day. E11.29   oxyCODONE-acetaminophen (PERCOCET) 5-325 MG tablet Take 1 tablet by mouth every 6 (six) hours as needed for severe pain.   oxyCODONE-acetaminophen (PERCOCET/ROXICET) 5-325 MG tablet Take 1 tablet by mouth every 6 (six) hours as needed for severe pain.   PARoxetine Mesylate (BRISDELLE) 7.5 MG CAPS Take 7.5 mg by mouth daily.    predniSONE (DELTASONE) 5 MG tablet Take 5 mg by mouth daily with breakfast.   telmisartan (MICARDIS) 20 MG tablet TAKE 1 TABLET BY MOUTH EVERY DAY   venlafaxine XR (EFFEXOR-XR) 75 MG 24 hr capsule Take 1 capsule (75 mg total) by mouth 2 (two) times daily. (Patient taking differently: Take 75 mg by mouth every evening. )    Allergies  Allergen Reactions   Methotrexate Other (See Comments)    Peri-oral and buccal lesions   Vancomycin Other (See Comments)    DOSE RELATED NEPHROTOXICITY   Lisinopril Cough   Chlorhexidine Itching   Clindamycin/Lincomycin Nausea And Vomiting and Rash   Doxycycline Rash   Teflaro [Ceftaroline] Rash and Other (See Comments)    Tolerates ceftriaxone      SOCIAL HISTORY/FAMILY HISTORY   Social History   Tobacco Use   Smoking status: Never Smoker   Smokeless tobacco: Never Used  Substance Use  Topics   Alcohol use: No    Alcohol/week: 0.0 standard drinks   Drug use: No   Social History    Social History Narrative   Married 1994. 2 sons who both live close and 1 grandson.       Disability due to sarcoidosis. 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   OBJCTIVE -PE, EKG, labs   Wt Readings from Last 3 Encounters:  08/30/19 258 lb (117 kg)  08/09/19 250 lb (113.4 kg)  07/22/19 250 lb (113.4 kg)    Physical Exam: BP 138/74 (BP Location: Right Arm, Patient Position: Sitting, Cuff Size: Normal)    Pulse 81    Temp 97.8 F (36.6 C)    Ht 5\' 6"  (1.676 m)    Wt 258 lb (117 kg)    LMP  (Approximate)    BMI 41.64 kg/m  Physical Exam  Constitutional: She is oriented to person, place, and time. She appears well-developed and well-nourished. No distress.  Morbidly obese but otherwise healthy-appearing.  Well-groomed.  Mild cushingoid appearance.  HENT:  Head: Normocephalic and atraumatic.  Neck: Normal range of motion. Neck supple. No JVD present.  Cardiovascular: Normal rate, regular rhythm, S1 normal and S2 normal.  No extrasystoles are present. PMI is not displaced (unable to palpapte). Exam reveals distant heart sounds and decreased pulses (Palpable but weak and pedal pulses compared to right radial). Exam reveals no gallop and no friction rub.  No murmur heard. Pulmonary/Chest: Effort normal. She has no rales. She exhibits no tenderness.  Diffusely decreased breath sounds, likely related to body habitus.  Mild  interstitial sounds versus crackles.  But no rhonchi or rales.  No active wheezing  Abdominal: Soft. Bowel sounds are normal. She exhibits no distension. There is no abdominal tenderness. There is no rebound.  Obese.  Unable to palpate HSM  Musculoskeletal: Normal range of motion.        General: Edema (Trivial bilateral pedal) present.  Neurological: She is alert and oriented to person, place, and time.  Psychiatric: She has a normal mood and affect. Her behavior is normal. Judgment and thought content normal.  Vitals reviewed.   Adult ECG Report  Rate: 81 ;  Rhythm: normal sinus rhythm and CRO anterior MI, age undetermined; otherwise normal axis, intervals & durations.;   Narrative Interpretation: No change.  Recent Labs:   Very different breakdown of calculated LDL from May 2018 to LDL direct in September 2020. Lab Results  Component Value Date   CHOL 160 07/04/2019   HDL 32.20 (L) 07/04/2019   LDLCALC 49 03/10/2017   LDLDIRECT 86.0 07/04/2019   TRIG 365.0 (H) 07/04/2019   CHOLHDL 5 07/04/2019   Lab Results  Component Value Date   CREATININE 0.95 07/04/2019   BUN 14 07/04/2019   NA 137 07/04/2019   K 4.3 07/04/2019   CL 96 07/04/2019   CO2 29 07/04/2019    ASSESSMENT/PLAN  Problem List Items Addressed This Visit    Hypertensive heart disease with chronic diastolic congestive heart failure ([place]) - Primary (Chronic)    Stable.  No symptoms to suggest exacerbation of diastolic dysfunction/diastolic heart failure. Blood pressure borderline controlled with carvedilol and diltiazem along with Micardis. If BP continues to be elevated during PCP evaluations, would probably be able to titrate up Micardis dose.  Edema/volume status well controlled with 40 mg mostly daily but occasionally twice daily dosing of Lasix.      Relevant Medications   icosapent Ethyl (VASCEPA) 1 g capsule   Other Relevant Orders   EKG 12-Lead   Hyperlipidemia associated with type 2 diabetes  mellitus ([place])    Interestingly, her LDL level has gone up.  I am not sure if this is the difference between LDL Calc versus LDL direct.  However the LDL direct is always been a little bit higher as noted from the difference between 2017 and 2018.  Suggest checking NMR panel for her next lipid evaluation just to confirm which LDL-direct or calculated is more accurate.  Pretty significant hypertriglyceridemia despite being on high-dose fenofibrate.  Simply to avoid further complication of side effects, we will have her stop fenofibrate and switch out for Vascepa 2 mg twice daily -> documented cardiovascular benefit for Vascepa over fenofibrate      Relevant Medications   icosapent Ethyl (VASCEPA) 1 g capsule   Preoperative clearance    Remained stable from a cardiac standpoint.  No active symptoms of heart failure or angina.  Her pulmonary status also seems to be relatively stable.  At this point I would not recommend any further evaluation prior to her hammertoe surgery.  She is on stable medications.  Not currently on any anticoagulation medication that would need to be held.  From a cardiac standpoint would be low risk patient for low risk surgery.      Rapid palpitations    No further episodes of palpitations on current dose of carvedilol with the addition of diltiazem.  With resting heart rate 81 beats minute, this could be also increased which would help potential treatment of pulmonary retention.          COVID-19 Education: The signs and symptoms of COVID-19 were discussed with the patient and how to seek care for testing (follow up with PCP or arrange E-visit).   The importance of social distancing was discussed today.  I spent a total of 16 minutes with the patient and chart review. >  50% of the time was spent in direct patient consultation.  Additional time spent with chart review (studies, outside notes, etc): 6 Total Time: 8min   Current medicines are reviewed at  length with the patient today.  (+/- concerns) n/a   Patient Instructions / Medication Changes & Studies & Tests Ordered   Patient Instructions  Medication Instructions:   will try  Vascepa  2 capsule twice a day instead of fenofibrate *If you need a refill on your cardiac medications before your next appointment, please call your pharmacy*  Lab Work: Not needed   Testing/Procedures: Not needed  Follow-Up: At [facility], you and your health needs are our priority.  As part of our continuing mission to provide you with exceptional heart care, we have created designated Provider Care Teams.  These Care Teams include your primary Cardiologist (physician) and Advanced Practice Providers (APPs -  Physician Assistants and Nurse Practitioners) who all work together to provide you with the care you need,  when you need it.  Your next appointment:   12 month(s)  The format for your next appointment:   In Person  Provider:   [name], MD  Other Instructions     Studies Ordered:   Orders Placed This Encounter  Procedures   EKG 12-Lead     [name], M.D., M.S. Interventional Cardiologist   Pager # [phone] Phone # [phone] [address] [place], [place] [address]   Thank you for choosing Heartcare at [facility]!!

## 2019-09-01 ENCOUNTER — Encounter: Payer: Self-pay | Admitting: Cardiology

## 2019-09-01 NOTE — Assessment & Plan Note (Addendum)
Remained stable from a cardiac standpoint.  No active symptoms of heart failure or angina.  Her pulmonary status also seems to be relatively stable.  At this point I would not recommend any further evaluation prior to her hammertoe surgery.  She is on stable medications.  Not currently on any anticoagulation medication that would need to be held.  From a cardiac standpoint would be low risk patient for low risk surgery.

## 2019-09-01 NOTE — Assessment & Plan Note (Signed)
Interestingly, her LDL level has gone up.  I am not sure if this is the difference between LDL Calc versus LDL direct.  However the LDL direct is always been a little bit higher as noted from the difference between 2017 and 2018.  Suggest checking NMR panel for her next lipid evaluation just to confirm which LDL-direct or calculated is more accurate.  Pretty significant hypertriglyceridemia despite being on high-dose fenofibrate.  Simply to avoid further complication of side effects, we will have her stop fenofibrate and switch out for Vascepa 2 mg twice daily -> documented cardiovascular benefit for Vascepa over fenofibrate

## 2019-09-01 NOTE — Assessment & Plan Note (Signed)
No further episodes of palpitations on current dose of carvedilol with the addition of diltiazem.  With resting heart rate 81 beats minute, this could be also increased which would help potential treatment of pulmonary retention.

## 2019-09-01 NOTE — Assessment & Plan Note (Signed)
Stable.  No symptoms to suggest exacerbation of diastolic dysfunction/diastolic heart failure. Blood pressure borderline controlled with carvedilol and diltiazem along with Micardis. If BP continues to be elevated during PCP evaluations, would probably be able to titrate up Micardis dose.  Edema/volume status well controlled with 40 mg mostly daily but occasionally twice daily dosing of Lasix.

## 2019-09-02 ENCOUNTER — Encounter: Payer: Self-pay | Admitting: Family Medicine

## 2019-09-03 ENCOUNTER — Other Ambulatory Visit: Payer: Self-pay

## 2019-09-03 ENCOUNTER — Encounter: Payer: 59 | Admitting: Physical Therapy

## 2019-09-03 DIAGNOSIS — Z20822 Contact with and (suspected) exposure to covid-19: Secondary | ICD-10-CM

## 2019-09-03 DIAGNOSIS — Z01812 Encounter for preprocedural laboratory examination: Secondary | ICD-10-CM

## 2019-09-03 DIAGNOSIS — Z20828 Contact with and (suspected) exposure to other viral communicable diseases: Secondary | ICD-10-CM

## 2019-09-04 ENCOUNTER — Ambulatory Visit: Payer: 59 | Admitting: Orthopedic Surgery

## 2019-09-04 LAB — NOVEL CORONAVIRUS, NAA: SARS-CoV-2, NAA: NOT DETECTED

## 2019-09-05 ENCOUNTER — Encounter: Payer: 59 | Admitting: Physical Therapy

## 2019-09-10 ENCOUNTER — Ambulatory Visit (INDEPENDENT_AMBULATORY_CARE_PROVIDER_SITE_OTHER): Payer: 59 | Admitting: Physical Therapy

## 2019-09-10 ENCOUNTER — Other Ambulatory Visit: Payer: Self-pay

## 2019-09-10 ENCOUNTER — Encounter: Payer: Self-pay | Admitting: Physical Therapy

## 2019-09-10 DIAGNOSIS — M25612 Stiffness of left shoulder, not elsewhere classified: Secondary | ICD-10-CM | POA: Diagnosis not present

## 2019-09-10 DIAGNOSIS — R293 Abnormal posture: Secondary | ICD-10-CM | POA: Diagnosis not present

## 2019-09-10 DIAGNOSIS — M25512 Pain in left shoulder: Secondary | ICD-10-CM

## 2019-09-10 NOTE — Therapy (Signed)
[facility] Physical Therapy [address] [place], [name], [address] Phone: [phone]   Fax:  [phone]  Physical Therapy Treatment  Patient Details  Name: [name] MRN: [id number] Date of Birth: 1961-01-29 Referring Provider (PT): Dr. Meridee Score   Encounter Date: 09/10/2019  PT End of Session - 09/10/19 0853    Visit Number  7    Number of Visits  12    Date for PT Re-Evaluation  09/25/19    PT Start Time  0805    PT Stop Time  0858    PT Time Calculation (min)  53 min    Activity Tolerance  Patient tolerated treatment well    Behavior During Therapy  [facility] for tasks assessed/performed       Past Medical History:  Diagnosis Date  . Abnormal SPEP 04/17/2014  . Acute left ankle pain 01/26/2017  . ANEMIA-UNSPECIFIED 09/18/2009  . Anxiety   . Arthritis   . Chronic diastolic heart failure, NYHA class 2 (HCC)    Normal LVEDP by May 2018  . COPD (chronic obstructive pulmonary disease) ([place])   . Depression   . DIABETES MELLITUS, TYPE II 08/21/2006  . Diabetic osteomyelitis ([place]) 05/29/2015  . Fracture of 5th metatarsal    non union  . GERD [id number]   Had fundoplication  . GOUT 08/20/2010  . Hammer toe of right foot    2-5th toes  . Hx of umbilical hernia repair   . HYPERLIPIDEMIA 08/21/2006  . HYPERTENSION 08/21/2006  . Infection of wound due to methicillin resistant Staphylococcus aureus (MRSA)   . Internal hemorrhoids   . Multiple allergies 10/14/2016  . OBESITY 06/04/2009  . Onychomycosis 10/27/2015  . Osteomyelitis of left foot ([place]) 05/29/2015  . Pulmonary sarcoidosis ([place])    Followed locally by pulmonology, but also by Dr. Casper Harrison at [facility]  . Right knee pain 01/26/2017  . Vocal cord dysfunction   . Wears partial dentures     Past Surgical History:  Procedure Laterality Date  . ABDOMINAL HYSTERECTOMY    . APPENDECTOMY    . BLADDER SUSPENSION  11/11/2011   Procedure: TRANSVAGINAL TAPE (TVT) PROCEDURE;  Surgeon:  Olga Millers, MD;  Location: [facility];  Service: Gynecology;  Laterality: N/A;  . CAROTIDS  02/18/11   CAROTID DUPLEX; VERTEBRALS ARE PATENT WITH ANTEGRADE FLOW. ICA/CCA RATIO 1.61 ON RIGHT AND 0.75 ON LEFT  . CHOLECYSTECTOMY  1984  . CYSTOSCOPY  11/11/2011   Procedure: CYSTOSCOPY;  Surgeon: Olga Millers, MD;  Location: [facility];  Service: Gynecology;  Laterality: N/A;  . EXTUBATION (ENDOTRACHEAL) IN OR N/A 09/23/2017   Procedure: EXTUBATION (ENDOTRACHEAL) IN OR;  Surgeon: Helayne Seminole, MD;  Location: [facility];  Service: ENT;  Laterality: N/A;  . FIBEROPTIC LARYNGOSCOPY AND TRACHEOSCOPY N/A 09/23/2017   Procedure: FLEXIBLE FIBEROPTIC LARYNGOSCOPY;  Surgeon: Helayne Seminole, MD;  Location: [facility];  Service: ENT;  Laterality: N/A;  . FRACTURE SURGERY     foot  . HALLUX FUSION Left 10/02/2018   Procedure: HALLUX ARTHRODESIS;  Surgeon: Edrick Kins, DPM;  Location: [facility];  Service: Podiatry;  Laterality: Left;  . HAMMER TOE SURGERY Left 10/02/2018   Procedure: HAMMER TOE CORRECTION 2ND 3RD 4RD FIFTH TOE;  Surgeon: Edrick Kins, DPM;  Location: [facility];  Service: Podiatry;  Laterality: Left;  . HAMMER TOE SURGERY Right 04/12/2019   Procedure: HAMMER TOE CORRECTION, 2ND, 3RD, 4TH AND 5TH TOES OF RIGHT FOOT;  Surgeon: [place] Kins, DPM;  Location: [place];  Service: Podiatry;  Laterality: Right;  . HERNIA REPAIR    . I&D EXTREMITY Left 06/27/2015   Procedure: Partial Excision Left Calcaneus, Place Antibiotic Beads, and Wound VAC;  Surgeon: Newt Minion, MD;  Location: [place];  Service: Orthopedics;  Laterality: Left;  . KNEE ARTHROSCOPY     right  . LEFT AND RIGHT HEART CATHETERIZATION WITH CORONARY ANGIOGRAM N/A 04/23/2013   Procedure: LEFT AND RIGHT HEART CATHETERIZATION WITH CORONARY ANGIOGRAM;  Surgeon: Leonie Man, MD;  Location: [facility] C[facility];  Service: Cardiovascular;  Laterality: N/A;  . LEFT HEART CATH AND CORONARY ANGIOGRAPHY N/A 03/11/2017   Procedure: Left Heart Cath and  Coronary Angiography;  Surgeon: Sherren Mocha, MD;  Location: [place] CV LAB; angiographically minimal CAD in the LAD otherwise normal.;  Normal LVEDP.  FALSE POSITIVE MYOVIEW  . LEFT HEART CATH AND CORONARY ANGIOGRAPHY  07/20/2010   LVEF 50-55% WITH VERY MILD GLOBAL HYPOKINESIA; ESSENTIALLY NORMAL CORONARY ARTERIES; NORMAL LV FUNCTION  . METATARSAL OSTEOTOMY WITH OPEN REDUCTION INTERNAL FIXATION (ORIF) METATARSAL WITH FUSION Left 04/09/2014   Procedure: LEFT FOOT FRACTURE OPEN TREATMENT METATARSAL INCLUDES INTERNAL FIXATION EACH;  Surgeon: Lorn Junes, MD;  Location: [place];  Service: Orthopedics;  Laterality: Left;  . NISSEN FUNDOPLICATION  [id number]  . NM MYOVIEW LTD  03/09/2013   Lexiscan --> EF 50%; NORMAL MYOCARDIAL PERFUSION STUDY - breast attenuation  . NM MYOVIEW LTD  03/10/2017   : Moderate size "stress-induced "perfusion defect at the apex as well as "ill-defined stress-induced perfusion defect in the lateral wall.  EF 55%.  INTERMEDIATE risk. -->  FALSE POSITIVE  . Right and left CARDIAC CATHETERIZATION  04/23/2013   Angiographic normal coronaries; LVEDP 20 mmHg, PCWP 12-14 mmHg, RAP 12 mmHg.; Fick CO/CI 4.9/2.2  . SHOULDER ARTHROSCOPY Left 03/14/2019   Procedure: LEFT SHOULDER ARTHROSCOPY, DEBRIDEMENT, AND DECOMPRESSION;  Surgeon: Newt Minion, MD;  Location: [facility];  Service: Orthopedics;  Laterality: Left;  . TOTAL KNEE ARTHROPLASTY Right 06/29/2017   Procedure: RIGHT TOTAL KNEE ARTHROPLASTY;  Surgeon: Newt Minion, MD;  Location: [place];  Service: Orthopedics;  Laterality: Right;  . TOTAL KNEE ARTHROPLASTY Left 12/07/2017  . TOTAL KNEE ARTHROPLASTY Left 12/07/2017   Procedure: LEFT TOTAL KNEE ARTHROPLASTY;  Surgeon: Newt Minion, MD;  Location: [facility];  Service: Orthopedics;  Laterality: Left;  . TRACHEOSTOMY TUBE PLACEMENT N/A 09/20/2017   Procedure: AWAKE INTUBATION WITH ANESTHESIA WITH VIDEO ASSISTANCE;  Surgeon: Helayne Seminole, MD;  Location: [facility];   Service: ENT;  Laterality: N/A;  . TRANSTHORACIC ECHOCARDIOGRAM  08/2014   Normal LV size and function.  Mild LVH.  EF 55-60%.  Normal regional wall motion.  GR 1 DD.  Normal RV size and function .  [name] TUBAL LIGATION     with reversal in 1994  . VENTRAL HERNIA REPAIR      There were no vitals filed for this visit.  Subjective Assessment - 09/10/19 0807    Subjective  shoulder is doing well; feels she's able to raise it better    Patient Stated Goals  regain independence - be able to dress, drive, toileting independently    Currently in Pain?  No/denies    Pain Onset  1 to 4 weeks ago                       [facility] Adult PT Treatment/Exercise - 09/10/19 0807      Shoulder Exercises: Standing  Horizontal ABduction  Left;20 reps;Weights    Horizontal ABduction Weight (lbs)  2   wate bar   Horizontal ABduction Limitations  ~ 30 deg flex/abdct    External Rotation  Left;Theraband;20 reps    Theraband Level (Shoulder External Rotation)  Level 4 (Blue)    External Rotation Limitations  walk out    Internal Rotation  Left;20 reps;Theraband    Theraband Level (Shoulder Internal Rotation)  Level 4 (Blue)    Internal Rotation Limitations  walk out    Flexion  Left;20 reps;Weights    Shoulder Flexion Weight (lbs)  2   wate bar   Flexion Limitations  cues to decrease shoulder shrug    ABduction  Left;20 reps;Weights    Shoulder ABduction Weight (lbs)  2   wate bar   ABduction Limitations  limited motion due to shoulder shrug    Extension  Both;Theraband;20 reps    Theraband Level (Shoulder Extension)  Level 4 (Blue)    Extension Limitations  5 sec hold    Row  Both;Theraband;20 reps    Theraband Level (Shoulder Row)  Level 4 (Blue)    Row Limitations  5 sec hold      Shoulder Exercises: Pulleys   Flexion  3 minutes    Scaption  3 minutes      Shoulder Exercises: Stretch   Internal Rotation Stretch  10 seconds   10 reps; behind back   Table Stretch - Flexion  5  reps;10 seconds   2 sets   Table Stretch - Abduction  5 reps;10 seconds   2 sets     Moist Heat Therapy   Number Minutes Moist Heat  12 Minutes    Moist Heat Location  Shoulder      Electrical Stimulation   Electrical Stimulation Location  Lt shoulder    Electrical Stimulation Action  IFC    Electrical Stimulation Parameters  to tolerance    Electrical Stimulation Goals  Pain                  PT Long Term Goals - 08/14/19 1007      PT LONG TERM GOAL #1   Title  independent with HEP    Status  New    Target Date  09/25/19      PT LONG TERM GOAL #2   Title  improve Lt shoulder AROM abduction and external rotation by at least 15 deg for improved function    Status  New    Target Date  09/25/19      PT LONG TERM GOAL #3   Title  report independence with ADLs for improved function and independence    Status  New    Target Date  09/25/19      PT LONG TERM GOAL #4   Title  report pain < 5/10 with activity for improved function    Status  New    Target Date  09/25/19            Plan - 09/10/19 0854    Clinical Impression Statement  Pt continues to progress well with PT, and nearing return of full function with Lt shoulder.  Anticipate pt will be ready for d/c next 1-2 weeks.    Personal Factors and Comorbidities  Comorbidity 3+    Comorbidities  pulmonary sardoidosis, obesity, HTN, DM, depression, COPD, CHF, anxiety, Lt TKA    Examination-Activity Limitations  Bathing;Reach Overhead;Bed Mobility;Carry;Sleep;Dressing;Hygiene/Grooming;Toileting    Examination-Participation Restrictions  Cleaning;Meal Prep;Driving  Stability/Clinical Decision Making  Stable/Uncomplicated    Rehab Potential  Good    PT Frequency  2x / week    PT Duration  6 weeks    PT Treatment/Interventions  ADLs/Self Care Home Management;Cryotherapy;Electrical Stimulation;Ultrasound;Moist Heat;Iontophoresis 4mg /ml Dexamethasone;Functional mobility training;Therapeutic activities;Therapeutic  exercise;Patient/family education;Neuromuscular re-education;Manual techniques;Passive range of motion;Vasopneumatic Device;Taping;Dry needling    PT Next Visit Plan  cont scap strengthening activities, modalities PRN; A/AA ROM exercises    PT Home Exercise Plan  Access Code: [email or web address]    Consulted and Agree with Plan of Care  Patient       Patient will benefit from skilled therapeutic intervention in order to improve the following deficits and impairments:  Decreased range of motion, Increased fascial restricitons, Pain, Impaired UE functional use, Decreased strength, Postural dysfunction  Visit Diagnosis: Acute pain of left shoulder  Abnormal posture  Stiffness of left shoulder joint     Problem List Patient Active Problem List   Diagnosis Date Noted  . Nontraumatic complete tear of left rotator cuff   . Impingement syndrome of left shoulder   . Diabetic neuropathy ([place]) 02/20/2019  . Severe persistent asthma 01/04/2019  . Subcortical microvascular ischemic occlusive disease 07/12/2018  . Rapid palpitations 07/12/2018  . Impingement of left ankle joint 06/26/2018  . Constipation 04/21/2018  . Anxiety 04/05/2018  . Total knee replacement status, left 12/07/2017  . Dysphagia 09/20/2017  . Epiglottitis   . Plantar fasciitis, right 07/13/2017  . S/P total knee arthroplasty, right 06/29/2017  . Osteoarthrosis, localized, primary, knee, right   . Sprain of calcaneofibular ligament of right ankle 06/06/2017  . Osteoarthritis of right knee 01/26/2017  . Onychomycosis 10/27/2015  . CKD (chronic kidney disease) stage 3, GFR 30-59 ml/min 04/27/2015  . MRSA (methicillin resistant staph aureus) culture positive 03/27/2015  . History of osteomyelitis 03/27/2015  . Fatty liver 09/30/2014  . Hot flashes 07/15/2014  . Abnormal SPEP 04/17/2014  . Fracture of left leg 04/17/2014  . Cushingoid side effect of steroids ([place]) 04/17/2014  . Internal hemorrhoids   . Major depression in full  remission ([place])   . Preoperative clearance 03/25/2014  . Obstructive chronic bronchitis without exacerbation ([place]) 09/18/2013  . Chest pain 04/11/2013  . Hypertensive heart disease with chronic diastolic congestive heart failure ([place])   . Solitary pulmonary nodule, on CT 02/2013 - stable over 2 years in 2015 02/20/2013  . Gout 08/20/2010  . Anemia 09/18/2009  . Morbid obesity ([place]) 06/04/2009  . Sleep apnea 04/21/2009  . Sarcoidosis of lung ([place]) 04/10/2007  . Hyperlipidemia associated with type 2 diabetes mellitus ([place]) 08/21/2006  . Hypertension associated with diabetes ([place]) 08/21/2006  . GERD 08/21/2006  . Type II diabetes mellitus with renal manifestations ([place]) 08/21/2006      Laureen Abrahams, PT, DPT 09/10/19 8:57 AM     [facility] [address] [place], [name], [address] Phone: [phone]   Fax:  [phone]  Name: [name] MRN: [id number] Date of Birth: [birth year]

## 2019-09-11 ENCOUNTER — Ambulatory Visit: Payer: 59 | Admitting: Orthopedic Surgery

## 2019-09-12 ENCOUNTER — Other Ambulatory Visit: Payer: Self-pay

## 2019-09-12 ENCOUNTER — Encounter: Payer: 59 | Admitting: Physical Therapy

## 2019-09-12 DIAGNOSIS — Z20822 Contact with and (suspected) exposure to covid-19: Secondary | ICD-10-CM

## 2019-09-13 ENCOUNTER — Ambulatory Visit (INDEPENDENT_AMBULATORY_CARE_PROVIDER_SITE_OTHER): Payer: 59 | Admitting: Family Medicine

## 2019-09-13 ENCOUNTER — Encounter: Payer: Self-pay | Admitting: Family Medicine

## 2019-09-13 VITALS — BP 159/65 | HR 86 | Temp 99.0°F | Ht 66.0 in | Wt 250.0 lb

## 2019-09-13 DIAGNOSIS — R0981 Nasal congestion: Secondary | ICD-10-CM | POA: Diagnosis not present

## 2019-09-13 DIAGNOSIS — J449 Chronic obstructive pulmonary disease, unspecified: Secondary | ICD-10-CM

## 2019-09-13 DIAGNOSIS — R059 Cough, unspecified: Secondary | ICD-10-CM

## 2019-09-13 DIAGNOSIS — R05 Cough: Secondary | ICD-10-CM | POA: Diagnosis not present

## 2019-09-13 DIAGNOSIS — D860 Sarcoidosis of lung: Secondary | ICD-10-CM | POA: Diagnosis not present

## 2019-09-13 MED ORDER — PREDNISONE 50 MG PO TABS: ORAL_TABLET | ORAL | 0 refills | Status: DC

## 2019-09-13 MED ORDER — DOXYCYCLINE HYCLATE 100 MG PO TABS: 100.0000 mg | ORAL_TABLET | Freq: Two times a day (BID) | ORAL | 0 refills | Status: AC

## 2019-09-13 NOTE — Progress Notes (Signed)
Phone [phone] Virtual visit via Video note   Subjective:  Chief complaint: Chief Complaint  Patient presents with  . virtual  . Headache  . Cough    This visit type was conducted due to national recommendations for restrictions regarding the COVID-19 Pandemic (e.g. social distancing).  This format is felt to be most appropriate for this patient at this time balancing risks to patient and risks to population by having him in for in person visit.  No physical exam was performed (except for noted visual exam or audio findings with Telehealth visits).    Our team/I connected with [name] at  3:40 PM EST by a video enabled telemedicine application (doxy.me or caregility through epic) and verified that I am speaking with the correct person using two identifiers.  Location patient: [place] Location provider: [facility], office Persons participating in the virtual visit:  patient  Our team/I discussed the limitations of evaluation and management by telemedicine and the availability of in person appointments. In light of current covid-19 pandemic, patient also understands that we are trying to protect them by minimizing in office contact if at all possible.  The patient expressed consent for telemedicine visit and agreed to proceed. Patient understands insurance will be billed.   Ros- No fever, body aches,nausea, vomiting, diarrhea, or new loss of taste or smell.  Past Medical History-  Patient Active Problem List   Diagnosis Date Noted  . Constipation 04/21/2018    Priority: High  . Obstructive chronic bronchitis without exacerbation ([place]) 09/18/2013    Priority: High  . Chest pain 04/11/2013    Priority: High  . Hypertensive heart disease with chronic diastolic congestive heart failure ([place])     Priority: High  . Sarcoidosis of lung ([place]) 04/10/2007    Priority: High  . Type II diabetes mellitus with renal manifestations ([place]) 08/21/2006    Priority: High  .  Epiglottitis     Priority: Medium  . Osteoarthritis of right knee 01/26/2017    Priority: Medium  . CKD (chronic kidney disease) stage 3, GFR 30-59 ml/min 04/27/2015    Priority: Medium  . History of osteomyelitis 03/27/2015    Priority: Medium  . Fatty liver 09/30/2014    Priority: Medium  . Major depression in full remission ([place])     Priority: Medium  . Gout 08/20/2010    Priority: Medium  . Anemia 09/18/2009    Priority: Medium  . Sleep apnea 04/21/2009    Priority: Medium  . Hyperlipidemia associated with type 2 diabetes mellitus ([place]) 08/21/2006    Priority: Medium  . Hypertension associated with diabetes ([place]) 08/21/2006    Priority: Medium  . Total knee replacement status, left 12/07/2017    Priority: Low  . Dysphagia 09/20/2017    Priority: Low  . Plantar fasciitis, right 07/13/2017    Priority: Low  . S/P total knee arthroplasty, right 06/29/2017    Priority: Low  . Osteoarthrosis, localized, primary, knee, right     Priority: Low  . Sprain of calcaneofibular ligament of right ankle 06/06/2017    Priority: Low  . Onychomycosis 10/27/2015    Priority: Low  . MRSA (methicillin resistant staph aureus) culture positive 03/27/2015    Priority: Low  . Hot flashes 07/15/2014    Priority: Low  . Abnormal SPEP 04/17/2014    Priority: Low  . Fracture of left leg 04/17/2014    Priority: Low  . Cushingoid side effect of steroids ([place]) 04/17/2014    Priority: Low  .  Internal hemorrhoids     Priority: Low  . Preoperative clearance 03/25/2014    Priority: Low  . Solitary pulmonary nodule, on CT 02/2013 - stable over 2 years in 2015 02/20/2013    Priority: Low  . Morbid obesity ([place]) 06/04/2009    Priority: Low  . GERD 08/21/2006    Priority: Low  . Nontraumatic complete tear of left rotator cuff   . Impingement syndrome of left shoulder   . Diabetic neuropathy ([place]) 02/20/2019  . Severe persistent asthma 01/04/2019  . Subcortical microvascular ischemic occlusive  disease 07/12/2018  . Rapid palpitations 07/12/2018  . Impingement of left ankle joint 06/26/2018  . Anxiety 04/05/2018    Medications- reviewed and updated Current Outpatient Medications  Medication Sig Dispense Refill  . albuterol (PROVENTIL HFA;VENTOLIN HFA) 108 (90 Base) MCG/ACT inhaler Inhale 1-2 puffs into the lungs every 6 (six) hours as needed for wheezing or shortness of breath. 8 g 5  . allopurinol (ZYLOPRIM) 100 MG tablet Take 100 mg by mouth every evening.     [name] aspirin EC 81 MG tablet Take 81 mg by mouth daily.     [name] atorvastatin (LIPITOR) 40 MG tablet TAKE 1 TABLET BY MOUTH EVERY DAY 90 tablet 3  . BREO ELLIPTA 200-25 MCG/INH AEPB TAKE 1 PUFF BY MOUTH EVERY DAY 60 each 5  . buPROPion (WELLBUTRIN XL) 300 MG 24 hr tablet TAKE 1 TABLET BY MOUTH EVERY DAY (Patient taking differently: Take 300 mg by mouth daily. ) 90 tablet 3  . carvedilol (COREG) 25 MG tablet TAKE 1 TABLET (25 MG TOTAL) BY MOUTH 2 (TWO) TIMES DAILY WITH A MEAL. 180 tablet 3  . cholecalciferol (VITAMIN D) 25 MCG (1000 UT) tablet Take 1,000 Units by mouth 2 (two) times a day.    [name] dexlansoprazole (DEXILANT) 60 MG capsule Take 1 capsule (60 mg total) by mouth daily. 30 capsule 5  . diltiazem (CARDIZEM CD) 120 MG 24 hr capsule Take 1 capsule (120 mg total) by mouth daily. 30 capsule 2  . famotidine (PEPCID) 20 MG tablet Take 1 tablet (20 mg total) by mouth at bedtime. 30 tablet 3  . fenofibrate 160 MG tablet TAKE 1 TABLET BY MOUTH EVERY DAY 90 tablet 1  . fluticasone (FLONASE) 50 MCG/ACT nasal spray PLACE 2 SPRAYS INTO BOTH NOSTRILS DAILY AS NEEDED FOR ALLERGIES 48 mL 1  . furosemide (LASIX) 40 MG tablet TAKE 1 TABLET BY MOUTH TWICE A DAY 180 tablet 3  . gabapentin (NEURONTIN) 100 MG capsule TAKE 1 CAPSULE BY MOUTH THREE TIMES A DAY (Patient taking differently: Take 100-200 mg by mouth See admin instructions. Take 1 capsule (100 mg) by mouth in the morning & 2 capsules (200 mg) by mouth at night.) 270 capsule 3  .  glucose blood (ONETOUCH VERIO) test strip 1 each by Other route 2 (two) times daily. Use as instructed 200 each 3  . HYDROcodone-acetaminophen (NORCO/VICODIN) 5-325 MG tablet Take 1 tablet by mouth every 6 (six) hours as needed for moderate pain. 30 tablet 0  . icosapent Ethyl (VASCEPA) 1 g capsule Take 2 capsules (2 g total) by mouth 2 (two) times daily. 120 capsule 11  . Insulin Glargine (LANTUS SOLOSTAR) 100 UNIT/ML Solostar Pen Inject 60 Units into the skin daily. 18 mL 2  . insulin lispro (HUMALOG KWIKPEN) 100 UNIT/ML KwikPen Inject 0.3 mLs (30 Units total) into the skin daily with supper. 5 pen 11  . Insulin Pen Needle 33G X 4 MM MISC 1 each by  Does not apply route 4 (four) times daily. 200 each 11  . isosorbide mononitrate (IMDUR) 30 MG 24 hr tablet Take 1 tablet (30 mg total) by mouth at bedtime. 90 tablet 1  . KLOR-CON M20 20 MEQ tablet TAKE 1 & 1/2 TABLETS BY MOUTH TWICE A DAY (Patient taking differently: Take 30 mEq by mouth 2 (two) times a day. ) 270 tablet 2  . lactulose (CHRONULAC) 10 GM/15ML solution Take 10 g by mouth daily.     [name] linaclotide (LINZESS) 290 MCG CAPS capsule Take 1 capsule (290 mcg total) by mouth daily before breakfast. 30 capsule 11  . LORazepam (ATIVAN) 0.5 MG tablet Take 0.5 mg by mouth every 12 (twelve) hours as needed for anxiety.    . metFORMIN (GLUCOPHAGE) 1000 MG tablet TAKE 1 TABLET BY MOUTH 2 TIMES DAILY WITH A MEAL. MUST CALL TO SCHEDULE AN APPT. 180 tablet 1  . naproxen (NAPROSYN) 500 MG tablet Take 1 tablet (500 mg total) by mouth 2 (two) times daily with a meal. (Patient taking differently: Take 500 mg by mouth 2 (two) times daily as needed (pain). ) 60 tablet 1  . nitroGLYCERIN (NITROSTAT) 0.4 MG SL tablet Place 1 tablet (0.4 mg total) under the tongue every 5 (five) minutes as needed for chest pain. Reported on 01/05/2016 25 tablet 6  . ondansetron (ZOFRAN-ODT) 4 MG disintegrating tablet Take 1 tablet (4 mg total) by mouth every 8 (eight) hours as needed  for nausea or vomiting. 50 tablet 2  . OneTouch Delica Lancets 99991111 MISC 1 each by Does not apply route 2 (two) times a day. E11.29 100 each 2  . oxyCODONE-acetaminophen (PERCOCET) 5-325 MG tablet Take 1 tablet by mouth every 6 (six) hours as needed for severe pain. 30 tablet 0  . oxyCODONE-acetaminophen (PERCOCET/ROXICET) 5-325 MG tablet Take 1 tablet by mouth every 6 (six) hours as needed for severe pain. 28 tablet 0  . PARoxetine Mesylate (BRISDELLE) 7.5 MG CAPS Take 7.5 mg by mouth daily.     . predniSONE (DELTASONE) 5 MG tablet Take 5 mg by mouth daily with breakfast.    . telmisartan (MICARDIS) 20 MG tablet TAKE 1 TABLET BY MOUTH EVERY DAY 90 tablet 1  . venlafaxine XR (EFFEXOR-XR) 75 MG 24 hr capsule Take 1 capsule (75 mg total) by mouth 2 (two) times daily. (Patient taking differently: Take 75 mg by mouth every evening. ) 180 capsule 3  . doxycycline (VIBRA-TABS) 100 MG tablet Take 1 tablet (100 mg total) by mouth 2 (two) times daily for 7 days. 14 tablet 0  . predniSONE (DELTASONE) 50 MG tablet Take in place of 5 mg tablet for 5 days. 5 tablet 0   No current facility-administered medications for this visit.      Objective:  BP (!) 159/65   Pulse 86   Temp 99 F (37.2 C)   Ht 5\' 6"  (1.676 m)   Wt 250 lb (113.4 kg)   SpO2 98%   BMI 40.35 kg/m  self reported vitals Gen: NAD, resting comfortably Lungs: nonlabored, normal respiratory rate  Skin: appears dry, no obvious rash     Assessment and Plan   #cough/wheezing/sinus pressure and congestion S: pt c/o cough and headache starting yesterday. She is getting maxillary and sinus pressure. Mild chest tightness and wheezing and feels like it goes to her back-mildly short of breath with walking but oxygen levels have been okay.  She also notes- chills, cough, congestion, runny nose, shortness of breath, fatigue, sore  throat though mild aching.  No treatments yet tried. Pt states she was drinking coffee this morning and it tasted  bitter.  She had been exposed to [place] at church Sunday- person at church was asymptomatic but she spent fair amount of time talking to her about 30 mins to an hour. Patient nor her friend were wearing masks.  A/P: 58 year old female with pulmonary sarcoidosis followed at [facility] in the past as well as obstructive chronic bronchitis per pulmonary.   but she has responded well to prednisone in the past with wheezing/cough/congestion.  I think it is reasonable to bump her baseline present known.  She also has sinus pressure and has a history of significant sinusitis-we will go ahead and give her doxycycline she is not improving on prednisone within the next 24 to 48 hours.  She certainly could have COVID-19 based on her exposure.  I counseled her if her oxygen levels dropped below 94% persistently to go to the hospital immediately-she has at least a high moderate risk of complications from Covid if not a high risk due to underlying pulmonary disease.  I am glad she will at least be entered into consideration for antibody treatment on outpatient basis if her test is positive through [facility] automatically  Recommended follow up: As needed for new or worsening symptoms related to acute concerns Future Appointments  Date Time Provider [place]  09/18/2019 11:00 AM Faustino Congress F, PT [facility] None  09/19/2019  8:15 AM Renato Shin, MD [facility] None  09/20/2019 11:45 AM Faustino Congress F, PT [facility] None  09/24/2019  8:00 AM Laureen Abrahams, PT [facility] None  09/26/2019 11:00 AM Laureen Abrahams, PT [facility] None  09/27/2019  9:30 AM Newt Minion, MD [facility] None  11/02/2019  3:00 PM [name], MD [facility] P[facility]    Lab/Order associations:   ICD-10-CM   1. Cough  R05   2. Nasal congestion  R09.81   3. Sarcoidosis of lung ([place])  D86.0   4. Obstructive chronic bronchitis without exacerbation ([place])  J44.9     Meds ordered this encounter  Medications  . predniSONE  (DELTASONE) 50 MG tablet    Sig: Take in place of 5 mg tablet for 5 days.    Dispense:  5 tablet    Refill:  0  . doxycycline (VIBRA-TABS) 100 MG tablet    Sig: Take 1 tablet (100 mg total) by mouth 2 (two) times daily for 7 days.    Dispense:  14 tablet    Refill:  0   Return precautions advised.  Garret Reddish, MD

## 2019-09-13 NOTE — Patient Instructions (Signed)
Health Maintenance Due  Topic Date Due  . DEXA SCAN  Mar 29, 1961  . FOOT EXAM  08/26/2019   Depression screen [facility] 2/9 07/04/2019 04/10/2019 01/04/2019  Decreased Interest 0 0 1  Down, Depressed, Hopeless 0 0 0  PHQ - 2 Score 0 0 1  Altered sleeping 0 0 1  Tired, decreased energy 0 0 1  Change in appetite 0 0 0  Feeling bad or failure about yourself  0 0 0  Trouble concentrating 0 0 0  Moving slowly or fidgety/restless 0 0 0  Suicidal thoughts 0 0 0  PHQ-9 Score 0 0 3  Difficult doing work/chores Not difficult at all Not difficult at all Somewhat difficult  Some recent data might be hidden

## 2019-09-15 LAB — NOVEL CORONAVIRUS, NAA: SARS-CoV-2, NAA: DETECTED — AB

## 2019-09-16 ENCOUNTER — Other Ambulatory Visit: Payer: Self-pay | Admitting: Unknown Physician Specialty

## 2019-09-16 ENCOUNTER — Telehealth: Payer: Self-pay | Admitting: Unknown Physician Specialty

## 2019-09-16 DIAGNOSIS — U071 COVID-19: Secondary | ICD-10-CM

## 2019-09-16 NOTE — Telephone Encounter (Signed)
Pt has had a little trouble breathing and cough for 3 days. Discussed with patient about Covid symptoms and the use of bamlanivimab, a monoclonal antibody infusion for those with mild to moderate Covid symptoms and at a high risk of hospitalization.  [name] qualified for this infusion at the [facility] infusion center due to hypertension, obesity, and diabetes which were addressed with the patient and are actively being managed by a [facility] provider.    After discussing the infusion's costs, potential benefits and side effects, the patient has decided to accept treatment with monoclonal antibodies.

## 2019-09-17 ENCOUNTER — Encounter: Payer: Self-pay | Admitting: Family Medicine

## 2019-09-17 ENCOUNTER — Telehealth: Payer: Self-pay | Admitting: Family Medicine

## 2019-09-17 ENCOUNTER — Ambulatory Visit (INDEPENDENT_AMBULATORY_CARE_PROVIDER_SITE_OTHER): Payer: 59 | Admitting: Family Medicine

## 2019-09-17 VITALS — BP 158/74 | HR 74 | Temp 97.7°F | Ht 66.0 in | Wt 248.0 lb

## 2019-09-17 DIAGNOSIS — I5032 Chronic diastolic (congestive) heart failure: Secondary | ICD-10-CM

## 2019-09-17 DIAGNOSIS — I110 Hypertensive heart disease with heart failure: Secondary | ICD-10-CM

## 2019-09-17 DIAGNOSIS — U071 COVID-19: Secondary | ICD-10-CM

## 2019-09-17 NOTE — Patient Instructions (Signed)
Health Maintenance Due  Topic Date Due  . DEXA SCAN  Will address next office visit  12/18/1960  . FOOT EXAM  Will address at next visit.  08/26/2019    Depression screen PHQ 2/9 09/13/2019 07/04/2019 04/10/2019  Decreased Interest 0 0 0  Down, Depressed, Hopeless 0 0 0  PHQ - 2 Score 0 0 0  Altered sleeping 0 0 0  Tired, decreased energy 1 0 0  Change in appetite 0 0 0  Feeling bad or failure about yourself  0 0 0  Trouble concentrating 0 0 0  Moving slowly or fidgety/restless 0 0 0  Suicidal thoughts 0 0 0  PHQ-9 Score 1 0 0  Difficult doing work/chores Not difficult at all Not difficult at all Not difficult at all  Some recent data might be hidden    Recommended follow up: No follow-ups on file.

## 2019-09-17 NOTE — Telephone Encounter (Signed)
[place] [facility] [place] RECORD [facility] Patient Name: [name] Gender: Female DOB: April 12, 1961 Age: 58 Y 1 M 14 D Return Phone Number: [phone] (Primary) Address: City/State/Zip: [place] [address] Client [place] [facility] [place] Client Site [place] [facility] [place] Night Physician Garret Reddish- MD Contact Type Call Who Is Calling Patient / Member / Family / Caregiver Call Type Triage / Clinical Caller Name Semiko Gambone Relationship To Patient Spouse Return Phone Number [phone] (Primary) Chief Complaint Cough Reason for Call Symptomatic / Request for [place] states his wife tested positive for [id number]. She is needing to go out of town. She has a cough but nothing else. Translation No Nurse Assessment Nurse: Junius Creamer, RN, Debra Date/Time (Eastern Time): 09/15/2019 2:04:09 PM Confirm and document reason for call. If symptomatic, describe symptoms. ---Caller states his wife tested positive for [id number]. She is needing to go out of town. She has a cough but nothing else. normally has a cough but no worse than usual. wants to know about time frame for quarantine. Has the patient had close contact with a person known or suspected to have the novel coronavirus illness OR traveled / lives in area with major community spread (including international travel) in the last 14 days from the onset of symptoms? * If Asymptomatic, screen for exposure and travel within the last 14 days. ---Yes Does the patient have any new or worsening symptoms? ---Yes Will a triage be completed? ---Yes Related visit to physician within the last 2 weeks? ---Yes Does the PT have any chronic conditions? (i.e. diabetes, asthma, this includes High risk factors for pregnancy, etc.) ---Yes List chronic conditions. ---sarcoid dm htn cholesterol Is this a behavioral health  or substance abuse call? ---No Guidelines Guideline Title Affirmed Question Affirmed Notes Nurse Date/Time (Eastern Time) Coronavirus (COVID-19) - Diagnosed or Suspected [1] COVID-19 infection suspected by caller or triager AND [2] mild Junius Creamer, RN, Hilda Blades 09/15/2019 2:07:15 PM PLEASE NOTE: All timestamps contained within this report are represented as [place] Standard Time. CONFIDENTIALTY NOTICE: This fax transmission is intended only for the addressee. It contains information that is legally privileged, confidential or otherwise protected from use or disclosure. If you are not the intended recipient, you are strictly prohibited from reviewing, disclosing, copying using or disseminating any of this information or taking any action in reliance on or regarding this information. If you have received this fax in error, please notify us immediately by telephone so that we can arrange for its return to [place]. Phone: [phone], Toll-Free: [phone], Fax: [phone] Page: 2 of 2 Call Id: [id number] Guidelines Guideline Title Affirmed Question Affirmed Notes Nurse Date/Time Eilene Ghazi Time) symptoms (cough, fever, or others) AND [id number] no complications or SOB Disp. Time Eilene Ghazi Time) Disposition Final User 09/15/2019 2:13:06 PM Call PCP when Office is Open Yes Junius Creamer, RN, Carmela Rima Disagree/Comply Comply Caller Understands Yes PreDisposition Call Doctor Care Advice Given Per Guideline CALL PCP WHEN OFFICE IS OPEN: * You need to discuss this with your doctor (or NP/PA) within the next few days. * Call the office when it is open. GENERAL CARE ADVICE FOR COVID-19 SYMPTOMS: * The treatment is the same whether you have COVID-19, influenza or some other respiratory virus. * Feeling dehydrated: Drink extra liquids. If the air in your home is dry, use a humidifier. * Cough: Use cough drops. HOW TO PROTECT OTHERS - WHEN YOU ARE SICK WITH COVID-19: * STAY HOME  A MINIMUM OF 10 DAYS: Home isolation is  needed for at least 10 days after the symptoms started. Stay home from school or work if you are sick. Do NOT go to religious services, child care centers, shopping, or other public places. Do NOT use public transportation (e.g., bus, taxis, ride-sharing). Do NOT allow any visitors to your home. Leave the house only if you need to seek urgent medical care. * COVER THE COUGH: Cough and sneeze into your shirt sleeve or inner elbow. Don't cough into your hand or the air. If available, cough into a tissue and throw it into a trash can. * [place] HANDS OFTEN: Wash hands often with soap and water. After coughing or sneezing are important times. If soap and water are not available, use an alcohol-based hand sanitizer with at least 60% alcohol, covering all surfaces of your hands and rubbing them together until they feel dry. Avoid touching your eyes, nose, and mouth with unwashed hands. * WEAR A MASK: Wear a facemask when around others. Always wear a facemask (if available) if you have to leave your home (such as going to a medical facility). STOPPING HOME ISOLATION - MUST MEET ALL 3 REQUIREMENTS (CDC): * Fever gone for at least 24 hours off fever-reducing medicines AND * Cough and other symptoms must be improved AND * Symptoms started more than 10 days ago. CALL BACK IF: * Fever over 103 F (39.4 C) * Fever lasts over 3 days * Fever returns after being gone for 24 hours * Chest pain or difficulty breathing occurs * You become worse. CARE ADVICE given per CORONAVIRUS (COVID-19) - DIAGNOSED OR SUSPECTED (Adult) guideline.  PER TEAMHEALTH

## 2019-09-17 NOTE — Progress Notes (Signed)
Phone [phone] Virtual visit via Video note   Subjective:  Chief complaint: Chief Complaint  Patient presents with   Follow-up    This visit type was conducted due to national recommendations for restrictions regarding the COVID-19 Pandemic (e.g. social distancing).  This format is felt to be most appropriate for this patient at this time balancing risks to patient and risks to population by having him in for in person visit.  No physical exam was performed (except for noted visual exam or audio findings with Telehealth visits).    Our team/I connected with [name] at 10:00 AM EST by a video enabled telemedicine application ([email or web address] or caregility through epic) and verified that I am speaking with the correct person using two identifiers.  Location patient: [facility] Location provider: [facility], office Persons participating in the virtual visit:  patient  Our team/I discussed the limitations of evaluation and management by telemedicine and the availability of in person appointments. In light of current covid-19 pandemic, patient also understands that we are trying to protect them by minimizing in office contact if at all possible.  The patient expressed consent for telemedicine visit and agreed to proceed. Patient understands insurance will be billed.   ROS- No chills, sore throat, headache, nausea, vomiting, diarrhea  Past Medical History-  Patient Active Problem List   Diagnosis Date Noted   Constipation 04/21/2018    Priority: High   Obstructive chronic bronchitis without exacerbation ([place]) 09/18/2013    Priority: High   Chest pain 04/11/2013    Priority: High   Hypertensive heart disease with chronic diastolic congestive heart failure ([place])     Priority: High   Sarcoidosis of lung ([place]) 04/10/2007    Priority: High   Type II diabetes mellitus with renal manifestations ([place]) 08/21/2006    Priority: High   Epiglottitis     Priority: Medium    Osteoarthritis of right knee 01/26/2017    Priority: Medium   CKD (chronic kidney disease) stage 3, GFR 30-59 ml/min 04/27/2015    Priority: Medium   History of osteomyelitis 03/27/2015    Priority: Medium   Fatty liver 09/30/2014    Priority: Medium   Major depression in full remission ([place])     Priority: Medium   Gout 08/20/2010    Priority: Medium   Anemia 09/18/2009    Priority: Medium   Sleep apnea 04/21/2009    Priority: Medium   Hyperlipidemia associated with type 2 diabetes mellitus ([place]) 08/21/2006    Priority: Medium   Hypertension associated with diabetes ([place]) 08/21/2006    Priority: Medium   Total knee replacement status, left 12/07/2017    Priority: Low   Dysphagia 09/20/2017    Priority: Low   Plantar fasciitis, right 07/13/2017    Priority: Low   S/P total knee arthroplasty, right 06/29/2017    Priority: Low   Osteoarthrosis, localized, primary, knee, right     Priority: Low   Sprain of calcaneofibular ligament of right ankle 06/06/2017    Priority: Low   Onychomycosis 10/27/2015    Priority: Low   MRSA (methicillin resistant staph aureus) culture positive 03/27/2015    Priority: Low   Hot flashes 07/15/2014    Priority: Low   Abnormal SPEP 04/17/2014    Priority: Low   Fracture of left leg 04/17/2014    Priority: Low   Cushingoid side effect of steroids ([place]) 04/17/2014    Priority: Low   Internal hemorrhoids     Priority: Low   Preoperative  clearance 03/25/2014    Priority: Low   Solitary pulmonary nodule, on CT 02/2013 - stable over 2 years in 2015 02/20/2013    Priority: Low   Morbid obesity ([place]) 06/04/2009    Priority: Low   GERD 08/21/2006    Priority: Low   Nontraumatic complete tear of left rotator cuff    Impingement syndrome of left shoulder    Diabetic neuropathy ([place]) 02/20/2019   Severe persistent asthma 01/04/2019   Subcortical microvascular ischemic occlusive disease 07/12/2018   Rapid  palpitations 07/12/2018   Impingement of left ankle joint 06/26/2018   Anxiety 04/05/2018    Medications- reviewed and updated Current Outpatient Medications  Medication Sig Dispense Refill   albuterol (PROVENTIL HFA;VENTOLIN HFA) 108 (90 Base) MCG/ACT inhaler Inhale 1-2 puffs into the lungs every 6 (six) hours as needed for wheezing or shortness of breath. 8 g 5   allopurinol (ZYLOPRIM) 100 MG tablet Take 100 mg by mouth every evening.      aspirin EC 81 MG tablet Take 81 mg by mouth daily.      atorvastatin (LIPITOR) 40 MG tablet TAKE 1 TABLET BY MOUTH EVERY DAY 90 tablet 3   BREO ELLIPTA 200-25 MCG/INH AEPB TAKE 1 PUFF BY MOUTH EVERY DAY 60 each 5   buPROPion (WELLBUTRIN XL) 300 MG 24 hr tablet TAKE 1 TABLET BY MOUTH EVERY DAY (Patient taking differently: Take 300 mg by mouth daily. ) 90 tablet 3   carvedilol (COREG) 25 MG tablet TAKE 1 TABLET (25 MG TOTAL) BY MOUTH 2 (TWO) TIMES DAILY WITH A MEAL. 180 tablet 3   cholecalciferol (VITAMIN D) 25 MCG (1000 UT) tablet Take 1,000 Units by mouth 2 (two) times a day.     dexlansoprazole (DEXILANT) 60 MG capsule Take 1 capsule (60 mg total) by mouth daily. 30 capsule 5   diltiazem (CARDIZEM CD) 120 MG 24 hr capsule Take 1 capsule (120 mg total) by mouth daily. 30 capsule 2   doxycycline (VIBRA-TABS) 100 MG tablet Take 1 tablet (100 mg total) by mouth 2 (two) times daily for 7 days. 14 tablet 0   famotidine (PEPCID) 20 MG tablet Take 1 tablet (20 mg total) by mouth at bedtime. 30 tablet 3   fenofibrate 160 MG tablet TAKE 1 TABLET BY MOUTH EVERY DAY 90 tablet 1   fluticasone (FLONASE) 50 MCG/ACT nasal spray PLACE 2 SPRAYS INTO BOTH NOSTRILS DAILY AS NEEDED FOR ALLERGIES 48 mL 1   furosemide (LASIX) 40 MG tablet TAKE 1 TABLET BY MOUTH TWICE A DAY 180 tablet 3   gabapentin (NEURONTIN) 100 MG capsule TAKE 1 CAPSULE BY MOUTH THREE TIMES A DAY (Patient taking differently: Take 100-200 mg by mouth See admin instructions. Take 1 capsule  (100 mg) by mouth in the morning & 2 capsules (200 mg) by mouth at night.) 270 capsule 3   glucose blood (ONETOUCH VERIO) test strip 1 each by Other route 2 (two) times daily. Use as instructed 200 each 3   icosapent Ethyl (VASCEPA) 1 g capsule Take 2 capsules (2 g total) by mouth 2 (two) times daily. 120 capsule 11   Insulin Glargine (LANTUS SOLOSTAR) 100 UNIT/ML Solostar Pen Inject 60 Units into the skin daily. 18 mL 2   insulin lispro (HUMALOG KWIKPEN) 100 UNIT/ML KwikPen Inject 0.3 mLs (30 Units total) into the skin daily with supper. 5 pen 11   Insulin Pen Needle 33G X 4 MM MISC 1 each by Does not apply route 4 (four) times daily. 200 each  11   isosorbide mononitrate (IMDUR) 30 MG 24 hr tablet Take 1 tablet (30 mg total) by mouth at bedtime. 90 tablet 1   KLOR-CON M20 20 MEQ tablet TAKE 1 & 1/2 TABLETS BY MOUTH TWICE A DAY (Patient taking differently: Take 30 mEq by mouth 2 (two) times a day. ) 270 tablet 2   lactulose (CHRONULAC) 10 GM/15ML solution Take 10 g by mouth daily.      linaclotide (LINZESS) 290 MCG CAPS capsule Take 1 capsule (290 mcg total) by mouth daily before breakfast. 30 capsule 11   LORazepam (ATIVAN) 0.5 MG tablet Take 0.5 mg by mouth every 12 (twelve) hours as needed for anxiety.     metFORMIN (GLUCOPHAGE) 1000 MG tablet TAKE 1 TABLET BY MOUTH 2 TIMES DAILY WITH A MEAL. MUST CALL TO SCHEDULE AN APPT. 180 tablet 1   nitroGLYCERIN (NITROSTAT) 0.4 MG SL tablet Place 1 tablet (0.4 mg total) under the tongue every 5 (five) minutes as needed for chest pain. Reported on 01/05/2016 25 tablet 6   ondansetron (ZOFRAN-ODT) 4 MG disintegrating tablet Take 1 tablet (4 mg total) by mouth every 8 (eight) hours as needed for nausea or vomiting. 50 tablet 2   OneTouch Delica Lancets 99991111 MISC 1 each by Does not apply route 2 (two) times a day. E11.29 100 each 2   PARoxetine Mesylate (BRISDELLE) 7.5 MG CAPS Take 7.5 mg by mouth daily.      predniSONE (DELTASONE) 50 MG tablet  Take in place of 5 mg tablet for 5 days. 5 tablet 0   telmisartan (MICARDIS) 20 MG tablet TAKE 1 TABLET BY MOUTH EVERY DAY 90 tablet 1   venlafaxine XR (EFFEXOR-XR) 75 MG 24 hr capsule Take 1 capsule (75 mg total) by mouth 2 (two) times daily. (Patient taking differently: Take 75 mg by mouth every evening. ) 180 capsule 3   No current facility-administered medications for this visit.      Objective:  BP (!) 158/74    Pulse 74    Temp 97.7 F (36.5 C) (Oral)    Ht 5\' 6"  (1.676 m)    Wt 248 lb (112.5 kg)    SpO2 98%    BMI 40.03 kg/m  self reported vitals Gen: NAD, resting comfortably Lungs: nonlabored, normal respiratory rate  Skin: appears dry, no obvious rash     Assessment and Plan   #COVID-19 S:Pt tested positive for covid, she is not feeling to good today. She does have a cough and is going for the infusion testing tomorrow that you were telling her about.   Current symptoms cough but not deep, some chest pain with cough, headache, ear pain. Feels flu like. Mild sinus pressure with cough. She has taken tylenol and that does help some. Temp into 99s,   cough, congestion, runny nose, mild shortness of breath, fatigue, body aches, headache. Has lost taste.   She started prednisone and that did help the wheezing. Has not gotten a deep cough. She has some older tussionex that has helped and helped when she is getting some pain in chest with coughing.  A/P: 58 year old female with cough, congestion, headaches, mild shortness of breath found to be positive for COVID-19.  We saw her last week for cough and some wheezing with baseline pulmonary sarcoidosis and prior relief with similar symptoms on prednisone-started prednisone at that time at high dose 50 mg up from her baseline 5 mg.  She reports wheezing and shortness of breath are slightly improved. -Patient is  extremely high risk for complications of [id number] and fortunately she has been selected for antibiotic treatment and will start  tomorrow -I was clear with patient that I have very low threshold for her going to the hospital with her number of risk factors -Discussed quarantine and the 13th earliest day out of quarantine.  Since her husband is in close contact with her his earliest time out of quarantine will be 14 days after that.  #hypertension/hypertensive heart disease S: compliant with carvedilol 25 mg twice a day, diltiazem 120 mg daily, Lasix 40 mg twice a day, Imdur 30 mg, telmisartan 20 mg BP Readings from Last 3 Encounters:  09/17/19 (!) 158/74  09/13/19 (!) 159/65  08/30/19 138/74  A/P: Patient's blood pressure has been elevated during acute illness-I asked her to monitor over the next 1 to 2 weeks-if blood pressure remains high potential he could increase telmisartan dosage.  No obvious heart failure exacerbation at this time.  Recommended follow up: As needed for acute concerns-certainly hopeful she can stay out of the hospital Future Appointments  Date Time Provider [place]  09/18/2019  8:30 AM [facility] RM 1 [facility] None  09/19/2019  8:15 AM Renato Shin, MD [facility] None  09/27/2019  9:30 AM Newt Minion, MD [facility] None  11/02/2019  3:00 PM [name], MD [facility]    Lab/Order associations:   ICD-10-CM   1. COVID-19  U07.1   2. Hypertensive heart disease with chronic diastolic congestive heart failure (HCC)  I11.0    I50.32    Return precautions advised.  Garret Reddish, MD

## 2019-09-17 NOTE — Telephone Encounter (Signed)
Called and spoke with pt, virtual appointment scheduled today @ 10.

## 2019-09-18 ENCOUNTER — Telehealth: Payer: Self-pay | Admitting: Orthopedic Surgery

## 2019-09-18 ENCOUNTER — Encounter: Payer: 59 | Admitting: Physical Therapy

## 2019-09-18 ENCOUNTER — Ambulatory Visit (HOSPITAL_COMMUNITY)
Admission: RE | Admit: 2019-09-18 | Discharge: 2019-09-18 | Disposition: A | Discharge status: Home / Self Care | Payer: 59 | Source: Ambulatory Visit | Attending: Pulmonary Disease | Admitting: Pulmonary Disease

## 2019-09-18 DIAGNOSIS — U071 COVID-19: Secondary | ICD-10-CM | POA: Insufficient documentation

## 2019-09-18 DIAGNOSIS — Z23 Encounter for immunization: Secondary | ICD-10-CM | POA: Diagnosis not present

## 2019-09-18 MED ORDER — SODIUM CHLORIDE 0.9 % IV SOLN
700.0000 mg | Freq: Once | INTRAVENOUS | Status: AC
  Administered 2019-09-18: 700 mg via INTRAVENOUS
  Filled 2019-09-18: qty 20

## 2019-09-18 MED ORDER — FAMOTIDINE IN NACL 20-0.9 MG/50ML-% IV SOLN: 20.0000 mg | Freq: Once | INTRAVENOUS | Status: DC | PRN

## 2019-09-18 MED ORDER — ALBUTEROL SULFATE HFA 108 (90 BASE) MCG/ACT IN AERS: 2.0000 | INHALATION_SPRAY | Freq: Once | RESPIRATORY_TRACT | Status: DC | PRN

## 2019-09-18 MED ORDER — DIPHENHYDRAMINE HCL 50 MG/ML IJ SOLN: 50.0000 mg | Freq: Once | INTRAMUSCULAR | Status: DC | PRN

## 2019-09-18 MED ORDER — SODIUM CHLORIDE 0.9 % IV SOLN
INTRAVENOUS | Status: DC | PRN
  Administered 2019-09-18: 1000 mL via INTRAVENOUS

## 2019-09-18 MED ORDER — EPINEPHRINE 0.3 MG/0.3ML IJ SOAJ: 0.3000 mg | Freq: Once | INTRAMUSCULAR | Status: DC | PRN

## 2019-09-18 MED ORDER — METHYLPREDNISOLONE SODIUM SUCC 125 MG IJ SOLR: 125.0000 mg | Freq: Once | INTRAMUSCULAR | Status: DC | PRN

## 2019-09-18 NOTE — Progress Notes (Signed)
  Diagnosis: COVID-19  Physician: Dr. Garret Reddish  Procedure: [facility] Med: bamlanivimab infusion - Provided patient with bamlanimivab fact sheet for patients, parents and caregivers prior to infusion.  Complications: No immediate complications noted.  Discharge: Discharged home   [place] 09/18/2019

## 2019-09-18 NOTE — Telephone Encounter (Signed)
Called patient left message with spouse for patient to call me back when she return home   Need to reschedule appointment to the first of the year. Patient tested positive for Covid-19

## 2019-09-19 ENCOUNTER — Encounter: Payer: Self-pay | Admitting: Endocrinology

## 2019-09-19 ENCOUNTER — Ambulatory Visit (INDEPENDENT_AMBULATORY_CARE_PROVIDER_SITE_OTHER): Payer: 59 | Admitting: Endocrinology

## 2019-09-19 ENCOUNTER — Other Ambulatory Visit: Payer: Self-pay

## 2019-09-19 ENCOUNTER — Other Ambulatory Visit: Payer: Self-pay | Admitting: Cardiovascular Disease

## 2019-09-19 DIAGNOSIS — Z794 Long term (current) use of insulin: Secondary | ICD-10-CM

## 2019-09-19 DIAGNOSIS — E1129 Type 2 diabetes mellitus with other diabetic kidney complication: Secondary | ICD-10-CM

## 2019-09-19 MED ORDER — MICROLET LANCETS MISC: 1.0000 | Freq: Two times a day (BID) | 2 refills | Status: DC

## 2019-09-19 MED ORDER — CONTOUR NEXT TEST VI STRP: 1.0000 | ORAL_STRIP | Freq: Four times a day (QID) | 3 refills | Status: AC

## 2019-09-19 NOTE — Progress Notes (Signed)
Subjective:    Patient ID: [name], female    DOB: May 22, 1961, 58 y.o.   MRN: [id number]  HPI  telehealth visit today via doxy video visit (pt has coronavirus) Alternatives to telehealth are presented to this patient, and the patient agrees to the telehealth visit. [name] advised of the cost of the visit, and agrees to this, also.   Patient is at home, and I am at the office.   Persons attending the telehealth visit: the patient, husband, and I Pt returns for f/u of diabetes mellitus: DM type: Insulin-requiring type 2 Dx'ed: [id number] Complications: polyneuropathy and mild CAD.  Therapy: insulin since 2006, and 2 oral meds.  GDM: never DKA: never Severe hypoglycemia: never Pancreatitis: never Pancreatic imaging: CT (2015) showed fatty atrophy.  Other: she takes multiple daily injections; she intermittently takes prednisone for sarcoidosis or AB.  Interval history: She back on on oral steroids for sarcoidosis.  no cbg record, but states cbg's vary from 330-500.  She takes as much as 50 units of novolog at a time.  She takes insulin as rx'ed. Pt says prior to steroids, cbg's were in the low-100's.  Today, novolog goes back to 5 mg qd.   Past Medical History:  Diagnosis Date  . Abnormal SPEP 04/17/2014  . Acute left ankle pain 01/26/2017  . ANEMIA-UNSPECIFIED 09/18/2009  . Anxiety   . Arthritis   . Chronic diastolic heart failure, NYHA class 2 (HCC)    Normal LVEDP by May 2018  . COPD (chronic obstructive pulmonary disease) ([place])   . Depression   . DIABETES MELLITUS, TYPE II 08/21/2006  . Diabetic osteomyelitis ([place]) 05/29/2015  . Fracture of 5th metatarsal    non union  . GERD [id number]   Had fundoplication  . GOUT 08/20/2010  . Hammer toe of right foot    2-5th toes  . Hx of umbilical hernia repair   . HYPERLIPIDEMIA 08/21/2006  . HYPERTENSION 08/21/2006  . Infection of wound due to methicillin resistant Staphylococcus aureus (MRSA)   . Internal hemorrhoids   .  Multiple allergies 10/14/2016  . OBESITY 06/04/2009  . Onychomycosis 10/27/2015  . Osteomyelitis of left foot ([place]) 05/29/2015  . Pulmonary sarcoidosis ([place])    Followed locally by pulmonology, but also by Dr. Casper Harrison at [facility]  . Right knee pain 01/26/2017  . Vocal cord dysfunction   . Wears partial dentures     Past Surgical History:  Procedure Laterality Date  . ABDOMINAL HYSTERECTOMY    . APPENDECTOMY    . BLADDER SUSPENSION  11/11/2011   Procedure: TRANSVAGINAL TAPE (TVT) PROCEDURE;  Surgeon: Olga Millers, MD;  Location: [facility];  Service: Gynecology;  Laterality: N/A;  . CAROTIDS  02/18/11   CAROTID DUPLEX; VERTEBRALS ARE PATENT WITH ANTEGRADE FLOW. ICA/CCA RATIO 1.61 ON RIGHT AND 0.75 ON LEFT  . CHOLECYSTECTOMY  1984  . CYSTOSCOPY  11/11/2011   Procedure: CYSTOSCOPY;  Surgeon: Olga Millers, MD;  Location: [facility];  Service: Gynecology;  Laterality: N/A;  . EXTUBATION (ENDOTRACHEAL) IN OR N/A 09/23/2017   Procedure: EXTUBATION (ENDOTRACHEAL) IN OR;  Surgeon: Helayne Seminole, MD;  Location: [place];  Service: ENT;  Laterality: N/A;  . FIBEROPTIC LARYNGOSCOPY AND TRACHEOSCOPY N/A 09/23/2017   Procedure: FLEXIBLE FIBEROPTIC LARYNGOSCOPY;  Surgeon: Helayne Seminole, MD;  Location: [place];  Service: ENT;  Laterality: N/A;  . FRACTURE SURGERY     foot  . HALLUX FUSION Left 10/02/2018   Procedure: HALLUX ARTHRODESIS;  Surgeon: Edrick Kins, DPM;  Location: [facility];  Service: Podiatry;  Laterality: Left;  . HAMMER TOE SURGERY Left 10/02/2018   Procedure: HAMMER TOE CORRECTION 2ND 3RD 4RD FIFTH TOE;  Surgeon: Edrick Kins, DPM;  Location: [facility];  Service: Podiatry;  Laterality: Left;  . HAMMER TOE SURGERY Right 04/12/2019   Procedure: HAMMER TOE CORRECTION, 2ND, 3RD, 4TH AND 5TH TOES OF RIGHT FOOT;  Surgeon: Edrick Kins, DPM;  Location: [place];  Service: Podiatry;  Laterality: Right;  . HERNIA REPAIR    . I&D EXTREMITY Left 06/27/2015   Procedure: Partial Excision  Left Calcaneus, Place Antibiotic Beads, and Wound VAC;  Surgeon: Newt Minion, MD;  Location: [place];  Service: Orthopedics;  Laterality: Left;  . KNEE ARTHROSCOPY     right  . LEFT AND RIGHT HEART CATHETERIZATION WITH CORONARY ANGIOGRAM N/A 04/23/2013   Procedure: LEFT AND RIGHT HEART CATHETERIZATION WITH CORONARY ANGIOGRAM;  Surgeon: Leonie Man, MD;  Location: [facility];  Service: Cardiovascular;  Laterality: N/A;  . LEFT HEART CATH AND CORONARY ANGIOGRAPHY N/A 03/11/2017   Procedure: Left Heart Cath and Coronary Angiography;  Surgeon: Sherren Mocha, MD;  Location: [place] [facility]; angiographically minimal CAD in the LAD otherwise normal.;  Normal LVEDP.  FALSE POSITIVE MYOVIEW  . LEFT HEART CATH AND CORONARY ANGIOGRAPHY  07/20/2010   LVEF 50-55% WITH VERY MILD GLOBAL HYPOKINESIA; ESSENTIALLY NORMAL CORONARY ARTERIES; NORMAL LV FUNCTION  . METATARSAL OSTEOTOMY WITH OPEN REDUCTION INTERNAL FIXATION (ORIF) METATARSAL WITH FUSION Left 04/09/2014   Procedure: LEFT FOOT FRACTURE OPEN TREATMENT METATARSAL INCLUDES INTERNAL FIXATION EACH;  Surgeon: Lorn Junes, MD;  Location: [place];  Service: Orthopedics;  Laterality: Left;  . NISSEN FUNDOPLICATION  [id number]  . NM MYOVIEW LTD  03/09/2013   Lexiscan --> EF 50%; NORMAL MYOCARDIAL PERFUSION STUDY - breast attenuation  . NM MYOVIEW LTD  03/10/2017   : Moderate size "stress-induced "perfusion defect at the apex as well as "ill-defined stress-induced perfusion defect in the lateral wall.  EF 55%.  INTERMEDIATE risk. -->  FALSE POSITIVE  . Right and left CARDIAC CATHETERIZATION  04/23/2013   Angiographic normal coronaries; LVEDP 20 mmHg, PCWP 12-14 mmHg, RAP 12 mmHg.; Fick CO/CI 4.9/2.2  . SHOULDER ARTHROSCOPY Left 03/14/2019   Procedure: LEFT SHOULDER ARTHROSCOPY, DEBRIDEMENT, AND DECOMPRESSION;  Surgeon: Newt Minion, MD;  Location: [place];  Service: Orthopedics;  Laterality: Left;  . TOTAL KNEE ARTHROPLASTY Right 06/29/2017    Procedure: RIGHT TOTAL KNEE ARTHROPLASTY;  Surgeon: Newt Minion, MD;  Location: [place];  Service: Orthopedics;  Laterality: Right;  . TOTAL KNEE ARTHROPLASTY Left 12/07/2017  . TOTAL KNEE ARTHROPLASTY Left 12/07/2017   Procedure: LEFT TOTAL KNEE ARTHROPLASTY;  Surgeon: Newt Minion, MD;  Location: [place];  Service: Orthopedics;  Laterality: Left;  . TRACHEOSTOMY TUBE PLACEMENT N/A 09/20/2017   Procedure: AWAKE INTUBATION WITH ANESTHESIA WITH VIDEO ASSISTANCE;  Surgeon: Helayne Seminole, MD;  Location: [facility];  Service: ENT;  Laterality: N/A;  . TRANSTHORACIC ECHOCARDIOGRAM  08/2014   Normal LV size and function.  Mild LVH.  EF 55-60%.  Normal regional wall motion.  GR 1 DD.  Normal RV size and function .  [name] TUBAL LIGATION     with reversal in 1994  . VENTRAL HERNIA REPAIR      Social History   Socioeconomic History  . Marital status: Married    Spouse name: MIRL FYE  . Number of children: 2  .  Years of education: 65  . Highest education level: Not on file  Occupational History  . Occupation: DISABLED  Tobacco Use  . Smoking status: Never Smoker  . Smokeless tobacco: Never Used  Substance and Sexual Activity  . Alcohol use: No    Alcohol/week: 0.0 standard drinks  . Drug use: No  . Sexual activity: Yes    Birth control/protection: Surgical  Other Topics Concern  . Not on file  Social History Narrative   Married 1994. 2 sons who both live close and 1 grandson.       Disability due to sarcoidosis. 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 Difficulty of Paying Living Expenses: Not on file  Food Insecurity:   . Worried About Charity fundraiser in the Last Year: Not on file  . Ran Out of Food in the Last Year: Not on file  Transportation Needs:   . Lack of  Transportation (Medical): Not on file  . Lack of Transportation (Non-Medical): Not on file  Physical Activity:   . Days of Exercise per Week: Not on file  . Minutes of Exercise per Session: Not on file  Stress:   . Feeling of Stress : Not on file  Social Connections:   . Frequency of Communication with Friends and Family: Not on file  . Frequency of Social Gatherings with Friends and Family: Not on file  . Attends Religious Services: Not on file  . Active Member of Clubs or Organizations: Not on file  . Attends Archivist Meetings: Not on file  . Marital Status: Not on file  Intimate Partner Violence:   . Fear of Current or Ex-Partner: Not on file  . Emotionally Abused: Not on file  . Physically Abused: Not on file  . Sexually Abused: Not on file    Current Outpatient Medications on File Prior to Visit  Medication Sig Dispense Refill  . albuterol (PROVENTIL HFA;VENTOLIN HFA) 108 (90 Base) MCG/ACT inhaler Inhale 1-2 puffs into the lungs every 6 (six) hours as needed for wheezing or shortness of breath. 8 g 5  . allopurinol (ZYLOPRIM) 100 MG tablet Take 100 mg by mouth every evening.     [name] aspirin EC 81 MG tablet Take 81 mg by mouth daily.     [name] atorvastatin (LIPITOR) 40 MG tablet TAKE 1 TABLET BY MOUTH EVERY DAY 90 tablet 3  . BREO ELLIPTA 200-25 MCG/INH AEPB TAKE 1 PUFF BY MOUTH EVERY DAY 60 each 5  . buPROPion (WELLBUTRIN XL) 300 MG 24 hr tablet TAKE 1 TABLET BY MOUTH EVERY DAY (Patient taking differently: Take 300 mg by mouth daily. ) 90 tablet 3  . carvedilol (COREG) 25 MG tablet TAKE 1 TABLET (25 MG TOTAL) BY MOUTH 2 (TWO) TIMES DAILY WITH A MEAL. 180 tablet 3  . cholecalciferol (VITAMIN D) 25 MCG (1000 UT) tablet Take 1,000 Units by mouth 2 (two) times a day.    [name] dexlansoprazole (DEXILANT) 60 MG capsule Take 1 capsule (60 mg total) by mouth daily. 30 capsule 5  . famotidine (PEPCID) 20 MG tablet Take 1 tablet (20 mg total) by mouth at bedtime. 30 tablet 3  .  fenofibrate 160 MG tablet TAKE 1 TABLET BY MOUTH EVERY DAY 90 tablet 1  .  fluticasone (FLONASE) 50 MCG/ACT nasal spray PLACE 2 SPRAYS INTO BOTH NOSTRILS DAILY AS NEEDED FOR ALLERGIES 48 mL 1  . furosemide (LASIX) 40 MG tablet TAKE 1 TABLET BY MOUTH TWICE A DAY 180 tablet 3  . gabapentin (NEURONTIN) 100 MG capsule TAKE 1 CAPSULE BY MOUTH THREE TIMES A DAY (Patient taking differently: Take 100-200 mg by mouth See admin instructions. Take 1 capsule (100 mg) by mouth in the morning & 2 capsules (200 mg) by mouth at night.) 270 capsule 3  . icosapent Ethyl (VASCEPA) 1 g capsule Take 2 capsules (2 g total) by mouth 2 (two) times daily. 120 capsule 11  . Insulin Glargine (LANTUS SOLOSTAR) 100 UNIT/ML Solostar Pen Inject 60 Units into the skin daily. 18 mL 2  . insulin lispro (HUMALOG KWIKPEN) 100 UNIT/ML KwikPen Inject 0.3 mLs (30 Units total) into the skin daily with supper. 5 pen 11  . Insulin Pen Needle 33G X 4 MM MISC 1 each by Does not apply route 4 (four) times daily. 200 each 11  . isosorbide mononitrate (IMDUR) 30 MG 24 hr tablet Take 1 tablet (30 mg total) by mouth at bedtime. 90 tablet 1  . KLOR-CON M20 20 MEQ tablet TAKE 1 & 1/2 TABLETS BY MOUTH TWICE A DAY (Patient taking differently: Take 30 mEq by mouth 2 (two) times a day. ) 270 tablet 2  . lactulose (CHRONULAC) 10 GM/15ML solution Take 10 g by mouth daily.     [name] linaclotide (LINZESS) 290 MCG CAPS capsule Take 1 capsule (290 mcg total) by mouth daily before breakfast. 30 capsule 11  . LORazepam (ATIVAN) 0.5 MG tablet Take 0.5 mg by mouth every 12 (twelve) hours as needed for anxiety.    . metFORMIN (GLUCOPHAGE) 1000 MG tablet TAKE 1 TABLET BY MOUTH 2 TIMES DAILY WITH A MEAL. MUST CALL TO SCHEDULE AN APPT. 180 tablet 1  . nitroGLYCERIN (NITROSTAT) 0.4 MG SL tablet Place 1 tablet (0.4 mg total) under the tongue every 5 (five) minutes as needed for chest pain. Reported on 01/05/2016 25 tablet 6  . ondansetron (ZOFRAN-ODT) 4 MG disintegrating  tablet Take 1 tablet (4 mg total) by mouth every 8 (eight) hours as needed for nausea or vomiting. 50 tablet 2  . PARoxetine Mesylate (BRISDELLE) 7.5 MG CAPS Take 7.5 mg by mouth daily.     . predniSONE (DELTASONE) 50 MG tablet Take in place of 5 mg tablet for 5 days. 5 tablet 0  . telmisartan (MICARDIS) 20 MG tablet TAKE 1 TABLET BY MOUTH EVERY DAY 90 tablet 1  . venlafaxine XR (EFFEXOR-XR) 75 MG 24 hr capsule Take 1 capsule (75 mg total) by mouth 2 (two) times daily. (Patient taking differently: Take 75 mg by mouth every evening. ) 180 capsule 3  . [DISCONTINUED] mupirocin nasal ointment (BACTROBAN) 2 % Place 1 application into the nose 2 (two) times daily. Use one-half of tube in each nostril twice daily for five (5) days. After application, press sides of nose together and gently massage. 10 g 0   No current facility-administered medications on file prior to visit.    Allergies  Allergen Reactions  . Methotrexate Other (See Comments)    Peri-oral and buccal lesions  . Vancomycin Other (See Comments)    DOSE RELATED NEPHROTOXICITY  . Lisinopril Cough  . Chlorhexidine Itching  . Clindamycin/Lincomycin Nausea And Vomiting and Rash  . Doxycycline Rash  . Teflaro [Ceftaroline] Rash and Other (See Comments)    Tolerates ceftriaxone  Family History  Problem Relation Age of Onset  . Diabetes Father   . Heart attack Father 62  . Coronary artery disease Father   . Heart failure Father   . Throat cancer Father   . COPD Mother   . Emphysema Mother   . Asthma Mother   . Heart failure Mother   . Breast cancer Mother   . Heart attack Maternal Grandfather   . Sarcoidosis Maternal Uncle   . Lung cancer Brother   . Diabetes Brother   . Colon cancer Neg Hx   . Rectal cancer Neg Hx     There were no vitals taken for this visit.   Review of Systems She denies hypoglycemia.      Objective:   Physical Exam       Assessment & Plan:  Insulin-requiring type 2 DM, with PN: worse  Sarciodosis: we'll have to adjust insulin for steroid rx, at least for now.   Patient Instructions  Please continue the same Lantus, and: please take the same Novolog with supper.   Also, take extra Novolog: 200's: 10 units 300's: 20 units Over 400: 30 units check your blood sugar 4 times a day: before the 3 meals, and at bedtime.  also check if you have symptoms of your blood sugar being too high or too low.  please keep a record of the readings and bring it to your next appointment here (or you can bring the meter itself).  You can write it on any piece of paper.  please call us sooner if your blood sugar goes below 70, or if you have a lot of readings over 200.  Please come back for a follow-up appointment in 2 months.

## 2019-09-19 NOTE — Patient Instructions (Addendum)
Please continue the same Lantus, and: please take the same Novolog with supper.   Also, take extra Novolog: 200's: 10 units 300's: 20 units Over 400: 30 units check your blood sugar 4 times a day: before the 3 meals, and at bedtime.  also check if you have symptoms of your blood sugar being too high or too low.  please keep a record of the readings and bring it to your next appointment here (or you can bring the meter itself).  You can write it on any piece of paper.  please call us sooner if your blood sugar goes below 70, or if you have a lot of readings over 200.  Please come back for a follow-up appointment in 2 months.

## 2019-09-20 ENCOUNTER — Encounter: Payer: 59 | Admitting: Physical Therapy

## 2019-09-24 ENCOUNTER — Encounter: Payer: 59 | Admitting: Physical Therapy

## 2019-09-26 ENCOUNTER — Encounter: Payer: 59 | Admitting: Physical Therapy

## 2019-09-27 ENCOUNTER — Other Ambulatory Visit: Payer: Self-pay | Admitting: Family Medicine

## 2019-09-27 ENCOUNTER — Ambulatory Visit: Payer: 59 | Admitting: Orthopedic Surgery

## 2019-10-02 DIAGNOSIS — D860 Sarcoidosis of lung: Secondary | ICD-10-CM | POA: Diagnosis not present

## 2019-10-02 DIAGNOSIS — Z6841 Body Mass Index (BMI) 40.0 and over, adult: Secondary | ICD-10-CM | POA: Diagnosis not present

## 2019-10-02 DIAGNOSIS — J9801 Acute bronchospasm: Secondary | ICD-10-CM | POA: Diagnosis not present

## 2019-10-02 DIAGNOSIS — J4550 Severe persistent asthma, uncomplicated: Secondary | ICD-10-CM | POA: Diagnosis not present

## 2019-10-03 ENCOUNTER — Telehealth: Payer: Self-pay | Admitting: Family Medicine

## 2019-10-03 NOTE — Telephone Encounter (Signed)
See note  Copied from [place] [id number]. Topic: General - Other >> Oct 03, 2019  1:16 PM Rainey Pines A wrote: Patient would like to speak with Dr. Ronney Lion nurse about medication for uti. Please advise

## 2019-10-03 NOTE — Telephone Encounter (Signed)
Pt c/o urinary pain and urgency and requested antibiotic. Pt transferred to front to schedule.

## 2019-10-04 ENCOUNTER — Other Ambulatory Visit: Payer: Self-pay

## 2019-10-04 ENCOUNTER — Encounter: Payer: Self-pay | Admitting: Family Medicine

## 2019-10-04 ENCOUNTER — Ambulatory Visit (INDEPENDENT_AMBULATORY_CARE_PROVIDER_SITE_OTHER): Payer: 59 | Admitting: Family Medicine

## 2019-10-04 VITALS — BP 130/66 | HR 66 | Temp 97.3°F | Ht 66.0 in | Wt 253.2 lb

## 2019-10-04 DIAGNOSIS — N3000 Acute cystitis without hematuria: Secondary | ICD-10-CM

## 2019-10-04 LAB — POCT URINALYSIS DIPSTICK
Bilirubin, UA: NEGATIVE
Blood, UA: NEGATIVE
Glucose, UA: NEGATIVE
Ketones, UA: NEGATIVE
Leukocytes, UA: NEGATIVE
Nitrite, UA: POSITIVE
Protein, UA: POSITIVE — AB
Spec Grav, UA: 1.015 (ref 1.010–1.025)
Urobilinogen, UA: 0.2 E.U./dL
pH, UA: 6.5 (ref 5.0–8.0)

## 2019-10-04 MED ORDER — SULFAMETHOXAZOLE-TRIMETHOPRIM 800-160 MG PO TABS: 1.0000 | ORAL_TABLET | Freq: Two times a day (BID) | ORAL | 0 refills | Status: DC

## 2019-10-04 NOTE — Progress Notes (Signed)
Patient: [name] MRN: [id number] DOB: Oct 08, 1961 PCP: [name], MD     Subjective:  Chief Complaint  Patient presents with  . Dysuria  . Back Pain  . pelvic pressure    HPI: The patient is a 58 y.o. female who presents today for symptoms of UTI.  C/O dysuria, lower back pain, pelvic pressure and urgency x 1 week ago.  She denies any CVA tenderness. Denies any visible blood in urine. She denies any foul smell or color change. She has increased frequency and urgency. She has taken tylenol. She has hx of uncontrolled diabetes (9.9 a1c 3 months ago). She has not had a UTI in a long while. No hospitalizations over the last 3 months.   Review of Systems  Constitutional: Negative for chills, diaphoresis and fever.  Gastrointestinal: Positive for nausea. Negative for abdominal pain and vomiting.  Genitourinary: Positive for dysuria, frequency, pelvic pain and urgency. Negative for flank pain, hematuria and vaginal discharge.  Musculoskeletal: Negative for back pain.    Allergies Patient is allergic to methotrexate; vancomycin; lisinopril; chlorhexidine; clindamycin/lincomycin; doxycycline; and teflaro [ceftaroline].  Past Medical History Patient  has a past medical history of Abnormal SPEP (04/17/2014), Acute left ankle pain (01/26/2017), ANEMIA-UNSPECIFIED (09/18/2009), Anxiety, Arthritis, Chronic diastolic heart failure, NYHA class 2 ([place]), COPD (chronic obstructive pulmonary disease) ([place]), Depression, DIABETES MELLITUS, TYPE II (08/21/2006), Diabetic osteomyelitis ([place]) (05/29/2015), Fracture of 5th metatarsal, GERD (08/21/2006), GOUT (08/20/2010), Hammer toe of right foot, umbilical hernia repair, HYPERLIPIDEMIA (08/21/2006), HYPERTENSION (08/21/2006), Infection of wound due to methicillin resistant Staphylococcus aureus (MRSA), Internal hemorrhoids, Multiple allergies (10/14/2016), OBESITY (06/04/2009), Onychomycosis (10/27/2015), Osteomyelitis of left foot ([place]) (05/29/2015),  Pulmonary sarcoidosis ([place]), Right knee pain (01/26/2017), Vocal cord dysfunction, and Wears partial dentures.  Surgical History Patient  has a past surgical history that includes Ventral hernia repair; Nissen fundoplication (123XX123); Cholecystectomy (1984); Abdominal hysterectomy; Knee arthroscopy; Tubal ligation; Bladder suspension (11/11/2011); Cystoscopy (11/11/2011); CAROTIDS (02/18/11); Right and left CARDIAC CATHETERIZATION (04/23/2013); Metatarsal osteotomy with open reduction, internal fixation (orif) metatarsal with fusion (Left, 04/09/2014); left and right heart catheterization with coronary angiogram (N/A, 04/23/2013); Hernia repair; Appendectomy; Fracture surgery; I & D extremity (Left, 06/27/2015); LEFT HEART CATH AND CORONARY ANGIOGRAPHY (N/A, 03/11/2017); Total knee arthroplasty (Right, 06/29/2017); Tracheostomy tube placement (N/A, 09/20/2017); Fiberoptic laryngoscopy and tracheoscopy (N/A, 09/23/2017); Extubation (endotracheal) in or (N/A, 09/23/2017); Total knee arthroplasty (Left, 12/07/2017); Total knee arthroplasty (Left, 12/07/2017); LEFT HEART CATH AND CORONARY ANGIOGRAPHY (07/20/2010); NM MYOVIEW LTD (03/09/2013); NM MYOVIEW LTD (03/10/2017); transthoracic echocardiogram (08/2014); Hammer toe surgery (Left, 10/02/2018); Hallux fusion (Left, 10/02/2018); Shoulder arthroscopy (Left, 03/14/2019); and Hammer toe surgery (Right, 04/12/2019).  Family History Pateint's family history includes Asthma in her mother; Breast cancer in her mother; COPD in her mother; Coronary artery disease in her father; Diabetes in her brother and father; Emphysema in her mother; Heart attack in her maternal grandfather; Heart attack (age of onset: 50) in her father; Heart failure in her father and mother; Lung cancer in her brother; Sarcoidosis in her maternal uncle; Throat cancer in her father.  Social History Patient  reports that she has never smoked. She has never used smokeless tobacco. She reports that she does not  drink alcohol or use drugs.    Objective: Vitals:   10/04/19 1030  BP: 130/66  Pulse: 66  Temp: (!) 97.3 F (36.3 C)  TempSrc: Skin  SpO2: 97%  Weight: 253 lb 3.2 oz (114.9 kg)  Height: 5\' 6"  (1.676 m)    Body mass index is  40.87 kg/m.  Physical Exam Vitals reviewed.  Constitutional:      General: She is not in acute distress.    Appearance: Normal appearance. She is obese. She is not ill-appearing.  HENT:     Head: Normocephalic and atraumatic.  Cardiovascular:     Rate and Rhythm: Normal rate and regular rhythm.     Heart sounds: Normal heart sounds.  Pulmonary:     Effort: Pulmonary effort is normal.     Breath sounds: Normal breath sounds.  Abdominal:     General: Bowel sounds are normal.     Palpations: Abdomen is soft.     Tenderness: There is abdominal tenderness (suprapubic area. ). There is no right CVA tenderness or left CVA tenderness.  Skin:    General: Skin is warm.  Neurological:     General: No focal deficit present.     Mental Status: She is alert and oriented to person, place, and time.  Psychiatric:        Mood and Affect: Mood normal.        Behavior: Behavior normal.     Urine dipstick shows positive for nitrites, protein.     Assessment/plan: 1. Acute cystitis without hematuria 3 day course of bactrim for simple cystitis. Discussed side effects of medication and to drink water on this. Will f/u on culture. Fever/worsening back pain, n/v/ go to ER. F/u as needed.  - POCT Urinalysis Dipstick - Urine culture    This visit occurred during the SARS-CoV-2 public health emergency.  Safety protocols were in place, including screening questions prior to the visit, additional usage of staff PPE, and extensive cleaning of exam room while observing appropriate contact time as indicated for disinfecting solutions.     Return if symptoms worsen or fail to improve.   Orma Flaming, MD [place]   10/04/2019

## 2019-10-04 NOTE — Patient Instructions (Addendum)
Urinary Tract Infection, Adult A urinary tract infection (UTI) is an infection of any part of the urinary tract. The urinary tract includes:  The kidneys.  The ureters.  The bladder.  The urethra. These organs make, store, and get rid of pee (urine) in the body. What are the causes? This is caused by germs (bacteria) in your genital area. These germs grow and cause swelling (inflammation) of your urinary tract. What increases the risk? You are more likely to develop this condition if:  You have a small, thin tube (catheter) to drain pee.  You cannot control when you pee or poop (incontinence).  You are female, and: ? You use these methods to prevent pregnancy: ? A medicine that kills sperm (spermicide). ? A device that blocks sperm (diaphragm). ? You have low levels of a female hormone (estrogen). ? You are pregnant.  You have genes that add to your risk.  You are sexually active.  You take antibiotic medicines.  You have trouble peeing because of: ? A prostate that is bigger than normal, if you are female. ? A blockage in the part of your body that drains pee from the bladder (urethra). ? A kidney stone. ? A nerve condition that affects your bladder (neurogenic bladder). ? Not getting enough to drink. ? Not peeing often enough.  You have other conditions, such as: ? Diabetes. ? A weak disease-fighting system (immune system). ? Sickle cell disease. ? Gout. ? Injury of the spine. What are the signs or symptoms? Symptoms of this condition include:  Needing to pee right away (urgently).  Peeing often.  Peeing small amounts often.  Pain or burning when peeing.  Blood in the pee.  Pee that smells bad or not like normal.  Trouble peeing.  Pee that is cloudy.  Fluid coming from the vagina, if you are female.  Pain in the belly or lower back. Other symptoms include:  Throwing up (vomiting).  No urge to eat.  Feeling mixed up (confused).  Being tired  and grouchy (irritable).  A fever.  Watery poop (diarrhea). How is this treated? This condition may be treated with:  Antibiotic medicine.  Other medicines.  Drinking enough water. Follow these instructions at home:  Medicines  Take over-the-counter and prescription medicines only as told by your doctor.  If you were prescribed an antibiotic medicine, take it as told by your doctor. Do not stop taking it even if you start to feel better. General instructions  Make sure you: ? Pee until your bladder is empty. ? Do not hold pee for a long time. ? Empty your bladder after sex. ? Wipe from front to back after pooping if you are a female. Use each tissue one time when you wipe.  Drink enough fluid to keep your pee pale yellow.  Keep all follow-up visits as told by your doctor. This is important. Contact a doctor if:  You do not get better after 1-2 days.  Your symptoms go away and then come back. Get help right away if:  You have very bad back pain.  You have very bad pain in your lower belly.  You have a fever.  You are sick to your stomach (nauseous).  You are throwing up. Summary  A urinary tract infection (UTI) is an infection of any part of the urinary tract.  This condition is caused by germs in your genital area.  There are many risk factors for a UTI. These include having a small, thin   tube to drain pee and not being able to control when you pee or poop.  Treatment includes antibiotic medicines for germs.  Drink enough fluid to keep your pee pale yellow. This information is not intended to replace advice given to you by your health care provider. Make sure you discuss any questions you have with your health care provider. Document Released: 03/15/2008 Document Revised: 09/14/2018 Document Reviewed: 04/06/2018 Elsevier Patient Education  2020 [employer].   -will put you on a drug called bactrim. You will take this twice a day for 3 days. Make sure  drink water with this. I have a culture pending too.   If any fever/worsening back pain, Nausea/vomiting, etc. Please go to ER. I will f/u on culture and let you know  If we need to change medication.   Merry christmas! Orma Flaming, MD [place]

## 2019-10-06 LAB — URINE CULTURE
MICRO NUMBER:: 1230367
SPECIMEN QUALITY:: ADEQUATE

## 2019-10-08 ENCOUNTER — Ambulatory Visit: Payer: Self-pay | Admitting: *Deleted

## 2019-10-08 ENCOUNTER — Other Ambulatory Visit: Payer: Self-pay | Admitting: Family Medicine

## 2019-10-08 MED ORDER — NITROFURANTOIN MONOHYD MACRO 100 MG PO CAPS: 100.0000 mg | ORAL_CAPSULE | Freq: Two times a day (BID) | ORAL | 0 refills | Status: AC

## 2019-10-08 NOTE — Telephone Encounter (Signed)
Patient calls with dry cough and intermittent SOB for one month. She is  post covid since 09/12/19. Cough occurs during the day and at night along with some wheezing at night. Denies any distress or difficulty breathing at this time. Stated she has some tussionex that helps somewhat. Encouraged to increase her water intake if not contraindicated with her CHF,  keep inside temperature cool and run humidifier. Warm transferred for virtual visit.   Reason for Disposition . Cough has been present for > 3 weeks  Answer Assessment - Initial Assessment Questions 1. ONSET: "When did the cough begin?"      For one month now 2. SEVERITY: "How bad is the cough today?"      During the day and coughs at night. 3. RESPIRATORY DISTRESS: "Describe your breathing."      Thinks she might be wheezing 4. FEVER: "Do you have a fever?" If so, ask: "What is your temperature, how was it measured, and when did it start?"     no 5. HEMOPTYSIS: "Are you coughing up any blood?" If so ask: "How much?" (flecks, streaks, tablespoons, etc.)     no 6. TREATMENT: "What have you done so far to treat the cough?" (e.g., meds, fluids, humidifier)     Tussionex  7. CARDIAC HISTORY: "Do you have any history of heart disease?" (e.g., heart attack, congestive heart failure)      CHF 8. LUNG HISTORY: "Do you have any history of lung disease?"  (e.g., pulmonary embolus, asthma, emphysema)     Circoidosis 9. PE RISK FACTORS: "Do you have a history of blood clots?" (or: recent major surgery, recent prolonged travel, bedridden)     no 10. OTHER SYMPTOMS: "Do you have any other symptoms? (e.g., runny nose, wheezing, chest pain)       Wheezing at night 11. PREGNANCY: "Is there any chance you are pregnant?" "When was your last menstrual period?"       Na  12. TRAVEL: "Have you traveled out of the country in the last month?" (e.g., travel history, exposures)      no  Protocols used: COUGH - ACUTE NON-PRODUCTIVE-A-AH

## 2019-10-08 NOTE — Telephone Encounter (Signed)
Noted  

## 2019-10-08 NOTE — Telephone Encounter (Signed)
Patient scheduled on 10/09/2019

## 2019-10-09 ENCOUNTER — Ambulatory Visit (INDEPENDENT_AMBULATORY_CARE_PROVIDER_SITE_OTHER): Payer: 59 | Admitting: Family Medicine

## 2019-10-09 DIAGNOSIS — R05 Cough: Secondary | ICD-10-CM | POA: Diagnosis not present

## 2019-10-09 DIAGNOSIS — R059 Cough, unspecified: Secondary | ICD-10-CM

## 2019-10-09 MED ORDER — HYDROCOD POLST-CPM POLST ER 10-8 MG/5ML PO SUER: 5.0000 mL | Freq: Two times a day (BID) | ORAL | 0 refills | Status: DC

## 2019-10-09 NOTE — Progress Notes (Signed)
    Chief Complaint:  [name] is a 58 y.o. female who presents today for a virtual office visit with a chief complaint of cough.   Assessment/Plan:  Cough No red flags.  Post infectious cough.  Will refill Tussionex.  Database reviewed with no red flags.  Discussed reasons to return to care.     Subjective:  HPI:  Cough Patient diagnosed with Covid on 09/12/2019.  She no longer has any fevers chills or body aches however has an ongoing cough.  Cough is nonproductive.  No shortness of breath.  She has been using Tussionex which has helped.  She would like refill today.  ROS: Per HPI  PMH: She reports that she has never smoked. She has never used smokeless tobacco. She reports that she does not drink alcohol or use drugs.      Objective/Observations  Physical Exam: Gen: NAD, resting comfortably Pulm: Normal work of breathing Neuro: Grossly normal, moves all extremities Psych: Normal affect and thought content  No results found. However, due to the size of the patient record, not all encounters were searched. Please check Results Review for a complete set of results.   Virtual Visit via Video   I connected with [name] on 10/09/19 at 11:20 AM EST by a video enabled telemedicine application and verified that I am speaking with the correct person using two identifiers. The limitations of evaluation and management by telemedicine and the availability of in person appointments were discussed. The patient expressed understanding and agreed to proceed.   Patient location: Home Provider location: [place] participating in the virtual visit: Myself and Patient     Algis Greenhouse. Jerline Pain, MD 10/09/2019 11:18 AM

## 2019-10-11 ENCOUNTER — Other Ambulatory Visit: Payer: Self-pay

## 2019-10-11 ENCOUNTER — Encounter: Payer: Self-pay | Admitting: Physical Therapy

## 2019-10-11 ENCOUNTER — Ambulatory Visit (INDEPENDENT_AMBULATORY_CARE_PROVIDER_SITE_OTHER): Payer: 59 | Admitting: Physical Therapy

## 2019-10-11 DIAGNOSIS — M25512 Pain in left shoulder: Secondary | ICD-10-CM

## 2019-10-11 DIAGNOSIS — R293 Abnormal posture: Secondary | ICD-10-CM | POA: Diagnosis not present

## 2019-10-11 DIAGNOSIS — M25612 Stiffness of left shoulder, not elsewhere classified: Secondary | ICD-10-CM | POA: Diagnosis not present

## 2019-10-11 NOTE — Therapy (Signed)
[facility] [address] [place], [name], [address] Phone: [phone]   Fax:  [phone]  Physical Therapy Treatment/Discharge Summary  Patient Details  Name: [name] MRN: [id number] Date of Birth: 1961-07-05 Referring Provider (PT): Dr. Meridee Score   Encounter Date: 10/11/2019  PT End of Session - 10/11/19 1336    Visit Number  8    Number of Visits  12    PT Start Time  4627    PT Stop Time  1330    PT Time Calculation (min)  25 min    Activity Tolerance  Patient tolerated treatment well    Behavior During Therapy  [facility] for tasks assessed/performed       Past Medical History:  Diagnosis Date  . Abnormal SPEP 04/17/2014  . Acute left ankle pain 01/26/2017  . ANEMIA-UNSPECIFIED 09/18/2009  . Anxiety   . Arthritis   . Chronic diastolic heart failure, NYHA class 2 (HCC)    Normal LVEDP by May 2018  . COPD (chronic obstructive pulmonary disease) ([place])   . Depression   . DIABETES MELLITUS, TYPE II 08/21/2006  . Diabetic osteomyelitis ([place]) 05/29/2015  . Fracture of 5th metatarsal    non union  . GERD 03/50/0938   Had fundoplication  . GOUT 08/20/2010  . Hammer toe of right foot    2-5th toes  . Hx of umbilical hernia repair   . HYPERLIPIDEMIA 08/21/2006  . HYPERTENSION 08/21/2006  . Infection of wound due to methicillin resistant Staphylococcus aureus (MRSA)   . Internal hemorrhoids   . Multiple allergies 10/14/2016  . OBESITY 06/04/2009  . Onychomycosis 10/27/2015  . Osteomyelitis of left foot ([place]) 05/29/2015  . Pulmonary sarcoidosis ([place])    Followed locally by pulmonology, but also by Dr. Casper Harrison at [facility]  . Right knee pain 01/26/2017  . Vocal cord dysfunction   . Wears partial dentures     Past Surgical History:  Procedure Laterality Date  . ABDOMINAL HYSTERECTOMY    . APPENDECTOMY    . BLADDER SUSPENSION  11/11/2011   Procedure: TRANSVAGINAL TAPE (TVT) PROCEDURE;  Surgeon: Olga Millers, MD;   Location: [facility];  Service: Gynecology;  Laterality: N/A;  . CAROTIDS  02/18/11   CAROTID DUPLEX; VERTEBRALS ARE PATENT WITH ANTEGRADE FLOW. ICA/CCA RATIO 1.61 ON RIGHT AND 0.75 ON LEFT  . CHOLECYSTECTOMY  1984  . CYSTOSCOPY  11/11/2011   Procedure: CYSTOSCOPY;  Surgeon: Olga Millers, MD;  Location: [facility];  Service: Gynecology;  Laterality: N/A;  . EXTUBATION (ENDOTRACHEAL) IN OR N/A 09/23/2017   Procedure: EXTUBATION (ENDOTRACHEAL) IN OR;  Surgeon: Helayne Seminole, MD;  Location: [facility];  Service: ENT;  Laterality: N/A;  . FIBEROPTIC LARYNGOSCOPY AND TRACHEOSCOPY N/A 09/23/2017   Procedure: FLEXIBLE FIBEROPTIC LARYNGOSCOPY;  Surgeon: Helayne Seminole, MD;  Location: [facility];  Service: ENT;  Laterality: N/A;  . FRACTURE SURGERY     foot  . HALLUX FUSION Left 10/02/2018   Procedure: HALLUX ARTHRODESIS;  Surgeon: Edrick Kins, DPM;  Location: [facility];  Service: Podiatry;  Laterality: Left;  . HAMMER TOE SURGERY Left 10/02/2018   Procedure: HAMMER TOE CORRECTION 2ND 3RD 4RD FIFTH TOE;  Surgeon: Edrick Kins, DPM;  Location: [facility];  Service: Podiatry;  Laterality: Left;  . HAMMER TOE SURGERY Right 04/12/2019   Procedure: HAMMER TOE CORRECTION, 2ND, 3RD, 4TH AND 5TH TOES OF RIGHT FOOT;  Surgeon: Edrick Kins, DPM;  Location: [facility];  Service: Podiatry;  Laterality:  Right;  . HERNIA REPAIR    . I & D EXTREMITY Left 06/27/2015   Procedure: Partial Excision Left Calcaneus, Place Antibiotic Beads, and Wound VAC;  Surgeon: Marcus Duda V, MD;  Location: [facility];  Service: Orthopedics;  Laterality: Left;  . KNEE ARTHROSCOPY     right  . LEFT AND RIGHT HEART CATHETERIZATION WITH CORONARY ANGIOGRAM N/A 04/23/2013   Procedure: LEFT AND RIGHT HEART CATHETERIZATION WITH CORONARY ANGIOGRAM;  Surgeon: David W Harding, MD;  [facility];  Service: Cardiovascular;  Laterality: N/A;  . LEFT HEART CATH AND CORONARY ANGIOGRAPHY N/A 03/11/2017   Procedure: Left Heart Cath and Coronary Angiography;   Surgeon: Cooper, Michael, MD;  Location: [facility]; angiographically minimal CAD in the LAD otherwise normal.;  Normal LVEDP.  FALSE POSITIVE MYOVIEW  . LEFT HEART CATH AND CORONARY ANGIOGRAPHY  07/20/2010   LVEF 50-55% WITH VERY MILD GLOBAL HYPOKINESIA; ESSENTIALLY NORMAL CORONARY ARTERIES; NORMAL LV FUNCTION  . METATARSAL OSTEOTOMY WITH OPEN REDUCTION INTERNAL FIXATION (ORIF) METATARSAL WITH FUSION Left 04/09/2014   Procedure: LEFT FOOT FRACTURE OPEN TREATMENT METATARSAL INCLUDES INTERNAL FIXATION EACH;  Surgeon: Robert A Wainer, MD;  Location: [facility];  Service: Orthopedics;  Laterality: Left;  . NISSEN FUNDOPLICATION  2004  . NM MYOVIEW LTD  03/09/2013   Lexiscan --> EF 50%; NORMAL MYOCARDIAL PERFUSION STUDY - breast attenuation  . NM MYOVIEW LTD  03/10/2017   : Moderate size "stress-induced "perfusion defect at the apex as well as "ill-defined stress-induced perfusion defect in the lateral wall.  EF 55%.  INTERMEDIATE risk. -->  FALSE POSITIVE  . Right and left CARDIAC CATHETERIZATION  04/23/2013   Angiographic normal coronaries; LVEDP 20 mmHg, PCWP 12-14 mmHg, RAP 12 mmHg.; Fick CO/CI 4.9/2.2  . SHOULDER ARTHROSCOPY Left 03/14/2019   Procedure: LEFT SHOULDER ARTHROSCOPY, DEBRIDEMENT, AND DECOMPRESSION;  Surgeon: Duda, Marcus V, MD;  [facility];  Service: Orthopedics;  Laterality: Left;  . TOTAL KNEE ARTHROPLASTY Right 06/29/2017   Procedure: RIGHT TOTAL KNEE ARTHROPLASTY;  Surgeon: Duda, Marcus V, MD;  [facility];  Service: Orthopedics;  Laterality: Right;  . TOTAL KNEE ARTHROPLASTY Left 12/07/2017  . TOTAL KNEE ARTHROPLASTY Left 12/07/2017   Procedure: LEFT TOTAL KNEE ARTHROPLASTY;  Surgeon: Duda, Marcus V, MD;  [facility];  Service: Orthopedics;  Laterality: Left;  . TRACHEOSTOMY TUBE PLACEMENT N/A 09/20/2017   Procedure: AWAKE INTUBATION WITH ANESTHESIA WITH VIDEO ASSISTANCE;  Surgeon: Marcellino, Amanda J, MD;  [facility];  Service: ENT;   Laterality: N/A;  . TRANSTHORACIC ECHOCARDIOGRAM  08/2014   Normal LV size and function.  Mild LVH.  EF 55-60%.  Normal regional wall motion.  GR 1 DD.  Normal RV size and function .  . TUBAL LIGATION     with reversal in 1994  . VENTRAL HERNIA REPAIR      There were no vitals filed for this visit.  Subjective Assessment - 10/11/19 1306    Subjective  overall feeling better; still has a residual cough but COVID symptoms seem to be improved.  shoulder is much better; feels like she's almost back to regular function.    Patient Stated Goals  regain independence - be able to dress, drive, toileting independently    Currently in Pain?  No/denies    Pain Onset  1 to 4 weeks ago         [facility] PT Assessment - 10/11/19 1310      Assessment   Medical Diagnosis  Lt   humerus fx    Referring Provider (PT)  Dr. Marcus Duda    Onset Date/Surgical Date  07/22/19    Hand Dominance  Right    Next MD Visit  10/15/19      AROM   Left Shoulder Flexion  139 Degrees    Left Shoulder ABduction  134 Degrees    Left Shoulder Internal Rotation  72 Degrees    Left Shoulder External Rotation  45 Degrees                   [facility] Adult PT Treatment/Exercise - 10/11/19 1322      Self-Care   Self-Care  Other Self-Care Comments    Other Self-Care Comments   current progress, PT recommendations for continued strengthening program and use of TENS PRN -pt verbalized understanding      Shoulder Exercises: Standing   Flexion  Left;20 reps;Weights    Shoulder Flexion Weight (lbs)  2    ABduction  Left;20 reps;Weights    Shoulder ABduction Weight (lbs)  2    ABduction Limitations  abduction and scaption    Other Standing Exercises  overhead press Lt 2x10; 2#             PT Education - 10/11/19 1336    Education Details  HEP; see self care    Person(s) Educated  Patient    Methods  Explanation;Demonstration;Handout    Comprehension  Verbalized understanding;Returned demonstration;Need  further instruction          PT Long Term Goals - 10/11/19 1337      PT LONG TERM GOAL #1   Title  independent with HEP    Status  Achieved      PT LONG TERM GOAL #2   Title  improve Lt shoulder AROM abduction and external rotation by at least 15 deg for improved function    Status  Achieved      PT LONG TERM GOAL #3   Title  report independence with ADLs for improved function and independence    Status  Achieved      PT LONG TERM GOAL #4   Title  report pain < 5/10 with activity for improved function    Status  Achieved            Plan - 10/11/19 1337    Clinical Impression Statement  Pt has met all LTGs and feels ready for d/c.  Provided strengthening HEP today and recommended she continue with this daily to work on regaining UE strength.  Pt ready for d/c.    Personal Factors and Comorbidities  Comorbidity 3+    Comorbidities  pulmonary sardoidosis, obesity, HTN, DM, depression, COPD, CHF, anxiety, Lt TKA    Examination-Activity Limitations  Bathing;Reach Overhead;Bed Mobility;Carry;Sleep;Dressing;Hygiene/Grooming;Toileting    Examination-Participation Restrictions  Cleaning;Meal Prep;Driving    Stability/Clinical Decision Making  Stable/Uncomplicated    Rehab Potential  Good    PT Frequency  2x / week    PT Duration  6 weeks    PT Treatment/Interventions  ADLs/Self Care Home Management;Cryotherapy;Electrical Stimulation;Ultrasound;Moist Heat;Iontophoresis 4mg/ml Dexamethasone;Functional mobility training;Therapeutic activities;Therapeutic exercise;Patient/family education;Neuromuscular re-education;Manual techniques;Passive range of motion;Vasopneumatic Device;Taping;Dry needling    PT Next Visit Plan  d/c PT today    PT Home Exercise Plan  Access Code: [email or web address]    Consulted and Agree with Plan of Care  Patient       Patient will benefit from skilled therapeutic intervention in order to improve the following deficits and impairments:  Decreased range of   motion,  Increased fascial restricitons, Pain, Impaired UE functional use, Decreased strength, Postural dysfunction  Visit Diagnosis: Acute pain of left shoulder  Abnormal posture  Stiffness of left shoulder joint     Problem List Patient Active Problem List   Diagnosis Date Noted  . Nontraumatic complete tear of left rotator cuff   . Impingement syndrome of left shoulder   . Diabetic neuropathy ([place]) 02/20/2019  . Severe persistent asthma 01/04/2019  . Subcortical microvascular ischemic occlusive disease 07/12/2018  . Rapid palpitations 07/12/2018  . Impingement of left ankle joint 06/26/2018  . Constipation 04/21/2018  . Anxiety 04/05/2018  . Total knee replacement status, left 12/07/2017  . Dysphagia 09/20/2017  . Epiglottitis   . Plantar fasciitis, right 07/13/2017  . S/P total knee arthroplasty, right 06/29/2017  . Osteoarthrosis, localized, primary, knee, right   . Sprain of calcaneofibular ligament of right ankle 06/06/2017  . Osteoarthritis of right knee 01/26/2017  . Onychomycosis 10/27/2015  . CKD (chronic kidney disease) stage 3, GFR 30-59 ml/min 04/27/2015  . MRSA (methicillin resistant staph aureus) culture positive 03/27/2015  . History of osteomyelitis 03/27/2015  . Fatty liver 09/30/2014  . Hot flashes 07/15/2014  . Abnormal SPEP 04/17/2014  . Fracture of left leg 04/17/2014  . Cushingoid side effect of steroids ([place]) 04/17/2014  . Internal hemorrhoids   . Major depression in full remission ([place])   . Preoperative clearance 03/25/2014  . Obstructive chronic bronchitis without exacerbation ([place]) 09/18/2013  . Chest pain 04/11/2013  . Hypertensive heart disease with chronic diastolic congestive heart failure ([place])   . Solitary pulmonary nodule, on CT 02/2013 - stable over 2 years in 2015 02/20/2013  . Gout 08/20/2010  . Anemia 09/18/2009  . Morbid obesity ([place]) 06/04/2009  . Sleep apnea 04/21/2009  . Sarcoidosis of lung ([place]) 04/10/2007  . Hyperlipidemia  associated with type 2 diabetes mellitus ([place]) 08/21/2006  . Hypertension associated with diabetes ([place]) 08/21/2006  . GERD 08/21/2006  . Type II diabetes mellitus with renal manifestations ([place]) 08/21/2006      Laureen Abrahams, PT, DPT 10/11/19 1:39 PM    [facility] [address] [place], [name], [address] Phone: [phone]   Fax:  [phone]  Name: [name] MRN: [id number] Date of Birth: 1961/06/19       PHYSICAL THERAPY DISCHARGE SUMMARY  Visits from Start of Care: 8  Current functional level related to goals / functional outcomes: See above   Remaining deficits: See above; overall pt back to baseline function   Education / Equipment: HEP  Plan: Patient agrees to discharge.  Patient goals were met. Patient is being discharged due to meeting the stated rehab goals.  ?????     Laureen Abrahams, PT, DPT 10/11/19 1:39 PM [facility] [address] [place], [name], [address] Phone: [phone]   Fax:  [phone]

## 2019-10-11 NOTE — Patient Instructions (Signed)
Access Code: [email or web address]  URL: [email or web address]  Date: 10/11/2019  Prepared by: Faustino Congress   Exercises Standing Row with Anchored Resistance - 15 reps - 1 sets - 2x daily - 7x weekly Single Arm Shoulder Flexion with Dumbbell - 10 reps - 3 sets - 1x daily - 7x weekly Standing Single Arm Shoulder Abduction with Dumbbell - Thumb Up - 10 reps - 3 sets - 1x daily - 7x weekly Standing Single Arm Shoulder Scaption with Dumbbell - 10 reps - 3 sets - 1x daily - 7x weekly Shoulder Overhead Press in Flexion with Dumbbells - 10 reps - 3 sets - 1x daily - 7x weekly

## 2019-10-15 ENCOUNTER — Other Ambulatory Visit: Payer: Self-pay

## 2019-10-15 ENCOUNTER — Encounter: Payer: Self-pay | Admitting: Orthopedic Surgery

## 2019-10-15 ENCOUNTER — Ambulatory Visit (INDEPENDENT_AMBULATORY_CARE_PROVIDER_SITE_OTHER): Payer: 59 | Admitting: Orthopedic Surgery

## 2019-10-15 VITALS — Ht 66.0 in | Wt 253.0 lb

## 2019-10-15 DIAGNOSIS — G8929 Other chronic pain: Secondary | ICD-10-CM | POA: Diagnosis not present

## 2019-10-15 DIAGNOSIS — M25512 Pain in left shoulder: Secondary | ICD-10-CM

## 2019-10-15 NOTE — Progress Notes (Signed)
Office Visit Note   Patient: [name]           Date of Birth: Oct 12, 1960           MRN: [id number] Visit Date: 10/15/2019              Requested by: [name], MD [place],  [place] [id number] PCP: [name], MD  Chief Complaint  Patient presents with  . Left Upper Arm - Follow-up    DOI 07/22/19 proximal humerus fx       HPI: This is a pleasant woman who is 2-1/2 months status post left proximal humerus fracture treated conservatively.  She has completed physical therapy.  It still does get sore occasionally but otherwise she is doing quite well and back to activities.  Assessment & Plan: Visit Diagnoses: No diagnosis found.  Plan: I think she may follow-up as needed.  We discussed that endurance is usually the cause of soreness after doing activities.  She should continue to strengthen her arm and may use anti-inflammatories as needed  Follow-Up Instructions: No follow-ups on file.   Ortho Exam  Patient is alert, oriented, no adenopathy, well-dressed, normal affect, normal respiratory effort. Left arm distal CMS is intact she is nontender to palpation over the area of the fracture she has full external and internal rotation and forward elevation.  Strength is equivalent to the unaffected side  Imaging: No results found. No images are attached to the encounter.  Labs: Lab Results  Component Value Date   HGBA1C 9.9 (H) 07/04/2019   HGBA1C 8.2 (H) 03/12/2019   HGBA1C 8.2 (A) 08/25/2018   ESRSEDRATE 28 06/16/2017   ESRSEDRATE 5 01/26/2017   ESRSEDRATE 27 10/14/2016   CRP 27.1 (H) 06/16/2017   CRP 17.3 (H) 01/26/2017   CRP 24.0 (H) 10/14/2016   LABURIC 5.6 07/04/2019   LABURIC 5.1 01/26/2017   REPTSTATUS 09/22/2017 FINAL 09/20/2017   GRAMSTAIN  08/14/2017    MODERATE WBC PRESENT, PREDOMINANTLY PMN MODERATE BUDDING YEAST SEEN    CULT NO GROUP A STREP (S.PYOGENES) ISOLATED 09/20/2017   LABORGA PSEUDOMONAS FLUORESCENS  08/14/2017     Lab Results  Component Value Date   ALBUMIN 4.2 07/04/2019   ALBUMIN 4.1 12/04/2018   ALBUMIN 4.4 04/05/2018   LABURIC 5.6 07/04/2019   LABURIC 5.1 01/26/2017    Lab Results  Component Value Date   MG 1.7 09/23/2017   MG 1.9 09/22/2017   MG 1.8 09/21/2017   No results found for: VD25OH  No results found for: PREALBUMIN CBC EXTENDED Latest Ref Rng & Units 07/04/2019 04/10/2019 03/12/2019  WBC 4.0 - 10.5 K/uL 11.4(H) 9.7 9.4  RBC 3.87 - 5.11 Mil/uL 4.01 3.76(L) 3.68(L)  HGB 12.0 - 15.0 g/dL 10.6(L) 10.2(L) 10.0(L)  HCT 36.0 - 46.0 % 33.7(L) 33.3(L) 33.1(L)  PLT 150.0 - 400.0 K/uL 366.0 341 311  NEUTROABS 1.4 - 7.7 K/uL - - -  LYMPHSABS 0.7 - 4.0 K/uL - - -     Body mass index is 40.84 kg/m.  Orders:  No orders of the defined types were placed in this encounter.  No orders of the defined types were placed in this encounter.    Procedures: No procedures performed  Clinical Data: No additional findings.  ROS:  All other systems negative, except as noted in the HPI. Review of Systems  Objective: Vital Signs: Ht 5\' 6"  (1.676 m)   Wt 253 lb (114.8 kg)   BMI 40.84 kg/m  Specialty Comments:  No specialty comments available.  PMFS History: Patient Active Problem List   Diagnosis Date Noted  . Nontraumatic complete tear of left rotator cuff   . Impingement syndrome of left shoulder   . Diabetic neuropathy ([place]) 02/20/2019  . Severe persistent asthma 01/04/2019  . Subcortical microvascular ischemic occlusive disease 07/12/2018  . Rapid palpitations 07/12/2018  . Impingement of left ankle joint 06/26/2018  . Constipation 04/21/2018  . Anxiety 04/05/2018  . Total knee replacement status, left 12/07/2017  . Dysphagia 09/20/2017  . Epiglottitis   . Plantar fasciitis, right 07/13/2017  . S/P total knee arthroplasty, right 06/29/2017  . Osteoarthrosis, localized, primary, knee, right   . Sprain of calcaneofibular ligament of right ankle  06/06/2017  . Osteoarthritis of right knee 01/26/2017  . Onychomycosis 10/27/2015  . CKD (chronic kidney disease) stage 3, GFR 30-59 ml/min 04/27/2015  . MRSA (methicillin resistant staph aureus) culture positive 03/27/2015  . History of osteomyelitis 03/27/2015  . Fatty liver 09/30/2014  . Hot flashes 07/15/2014  . Abnormal SPEP 04/17/2014  . Fracture of left leg 04/17/2014  . Cushingoid side effect of steroids ([place]) 04/17/2014  . Internal hemorrhoids   . Major depression in full remission ([place])   . Preoperative clearance 03/25/2014  . Obstructive chronic bronchitis without exacerbation ([place]) 09/18/2013  . Chest pain 04/11/2013  . Hypertensive heart disease with chronic diastolic congestive heart failure ([place])   . Solitary pulmonary nodule, on CT 02/2013 - stable over 2 years in 2015 02/20/2013  . Gout 08/20/2010  . Anemia 09/18/2009  . Morbid obesity ([place]) 06/04/2009  . Sleep apnea 04/21/2009  . Sarcoidosis of lung ([place]) 04/10/2007  . Hyperlipidemia associated with type 2 diabetes mellitus ([place]) 08/21/2006  . Hypertension associated with diabetes ([place]) 08/21/2006  . GERD 08/21/2006  . Type II diabetes mellitus with renal manifestations ([place]) 08/21/2006   Past Medical History:  Diagnosis Date  . Abnormal SPEP 04/17/2014  . Acute left ankle pain 01/26/2017  . ANEMIA-UNSPECIFIED 09/18/2009  . Anxiety   . Arthritis   . Chronic diastolic heart failure, NYHA class 2 (HCC)    Normal LVEDP by May 2018  . COPD (chronic obstructive pulmonary disease) ([place])   . Depression   . DIABETES MELLITUS, TYPE II 08/21/2006  . Diabetic osteomyelitis ([place]) 05/29/2015  . Fracture of 5th metatarsal    non union  . GERD [id number]   Had fundoplication  . GOUT 08/20/2010  . Hammer toe of right foot    2-5th toes  . Hx of umbilical hernia repair   . HYPERLIPIDEMIA 08/21/2006  . HYPERTENSION 08/21/2006  . Infection of wound due to methicillin resistant Staphylococcus aureus (MRSA)   . Internal  hemorrhoids   . Multiple allergies 10/14/2016  . OBESITY 06/04/2009  . Onychomycosis 10/27/2015  . Osteomyelitis of left foot ([place]) 05/29/2015  . Pulmonary sarcoidosis ([place])    Followed locally by pulmonology, but also by Dr. Casper Harrison at [facility]  . Right knee pain 01/26/2017  . Vocal cord dysfunction   . Wears partial dentures     Family History  Problem Relation Age of Onset  . Diabetes Father   . Heart attack Father 1  . Coronary artery disease Father   . Heart failure Father   . Throat cancer Father   . COPD Mother   . Emphysema Mother   . Asthma Mother   . Heart failure Mother   . Breast cancer Mother   . Heart attack Maternal Grandfather   .  Sarcoidosis Maternal Uncle   . Lung cancer Brother   . Diabetes Brother   . Colon cancer Neg Hx   . Rectal cancer Neg Hx     Past Surgical History:  Procedure Laterality Date  . ABDOMINAL HYSTERECTOMY    . APPENDECTOMY    . BLADDER SUSPENSION  11/11/2011   Procedure: TRANSVAGINAL TAPE (TVT) PROCEDURE;  Surgeon: Olga Millers, MD;  Location: [facility];  Service: Gynecology;  Laterality: N/A;  . CAROTIDS  02/18/11   CAROTID DUPLEX; VERTEBRALS ARE PATENT WITH ANTEGRADE FLOW. ICA/CCA RATIO 1.61 ON RIGHT AND 0.75 ON LEFT  . CHOLECYSTECTOMY  1984  . CYSTOSCOPY  11/11/2011   Procedure: CYSTOSCOPY;  Surgeon: Olga Millers, MD;  Location: [facility];  Service: Gynecology;  Laterality: N/A;  . EXTUBATION (ENDOTRACHEAL) IN OR N/A 09/23/2017   Procedure: EXTUBATION (ENDOTRACHEAL) IN OR;  Surgeon: Helayne Seminole, MD;  Location: [facility];  Service: ENT;  Laterality: N/A;  . FIBEROPTIC LARYNGOSCOPY AND TRACHEOSCOPY N/A 09/23/2017   Procedure: FLEXIBLE FIBEROPTIC LARYNGOSCOPY;  Surgeon: Helayne Seminole, MD;  Location: [facility];  Service: ENT;  Laterality: N/A;  . FRACTURE SURGERY     foot  . HALLUX FUSION Left 10/02/2018   Procedure: HALLUX ARTHRODESIS;  Surgeon: Edrick Kins, DPM;  Location: [facility];  Service: Podiatry;  Laterality:  Left;  . HAMMER TOE SURGERY Left 10/02/2018   Procedure: HAMMER TOE CORRECTION 2ND 3RD 4RD FIFTH TOE;  Surgeon: Edrick Kins, DPM;  Location: [facility];  Service: Podiatry;  Laterality: Left;  . HAMMER TOE SURGERY Right 04/12/2019   Procedure: HAMMER TOE CORRECTION, 2ND, 3RD, 4TH AND 5TH TOES OF RIGHT FOOT;  Surgeon: Edrick Kins, DPM;  Location: [facility];  Service: Podiatry;  Laterality: Right;  . HERNIA REPAIR    . I & D EXTREMITY Left 06/27/2015   Procedure: Partial Excision Left Calcaneus, Place Antibiotic Beads, and Wound VAC;  Surgeon: Newt Minion, MD;  Location: [facility];  Service: Orthopedics;  Laterality: Left;  . KNEE ARTHROSCOPY     right  . LEFT AND RIGHT HEART CATHETERIZATION WITH CORONARY ANGIOGRAM N/A 04/23/2013   Procedure: LEFT AND RIGHT HEART CATHETERIZATION WITH CORONARY ANGIOGRAM;  Surgeon: Leonie Man, MD;  Location: [facility];  Service: Cardiovascular;  Laterality: N/A;  . LEFT HEART CATH AND CORONARY ANGIOGRAPHY N/A 03/11/2017   Procedure: Left Heart Cath and Coronary Angiography;  Surgeon: Sherren Mocha, MD;  Location: [facility]; angiographically minimal CAD in the LAD otherwise normal.;  Normal LVEDP.  FALSE POSITIVE MYOVIEW  . LEFT HEART CATH AND CORONARY ANGIOGRAPHY  07/20/2010   LVEF 50-55% WITH VERY MILD GLOBAL HYPOKINESIA; ESSENTIALLY NORMAL CORONARY ARTERIES; NORMAL LV FUNCTION  . METATARSAL OSTEOTOMY WITH OPEN REDUCTION INTERNAL FIXATION (ORIF) METATARSAL WITH FUSION Left 04/09/2014   Procedure: LEFT FOOT FRACTURE OPEN TREATMENT METATARSAL INCLUDES INTERNAL FIXATION EACH;  Surgeon: Lorn Junes, MD;  Location: [facility];  Service: Orthopedics;  Laterality: Left;  . NISSEN FUNDOPLICATION  [id number]  . NM MYOVIEW LTD  03/09/2013   Lexiscan --> EF 50%; NORMAL MYOCARDIAL PERFUSION STUDY - breast attenuation  . NM MYOVIEW LTD  03/10/2017   : Moderate size "stress-induced "perfusion defect at the apex as well as "ill-defined stress-induced  perfusion defect in the lateral wall.  EF 55%.  INTERMEDIATE risk. -->  FALSE POSITIVE  . Right and left CARDIAC CATHETERIZATION  04/23/2013   Angiographic normal coronaries; LVEDP 20 mmHg, PCWP 12-14 mmHg, RAP 12  mmHg.; Fick CO/CI 4.9/2.2  . SHOULDER ARTHROSCOPY Left 03/14/2019   Procedure: LEFT SHOULDER ARTHROSCOPY, DEBRIDEMENT, AND DECOMPRESSION;  Surgeon: Newt Minion, MD;  Location: [facility];  Service: Orthopedics;  Laterality: Left;  . TOTAL KNEE ARTHROPLASTY Right 06/29/2017   Procedure: RIGHT TOTAL KNEE ARTHROPLASTY;  Surgeon: Newt Minion, MD;  Location: [facility];  Service: Orthopedics;  Laterality: Right;  . TOTAL KNEE ARTHROPLASTY Left 12/07/2017  . TOTAL KNEE ARTHROPLASTY Left 12/07/2017   Procedure: LEFT TOTAL KNEE ARTHROPLASTY;  Surgeon: Newt Minion, MD;  Location: [facility];  Service: Orthopedics;  Laterality: Left;  . TRACHEOSTOMY TUBE PLACEMENT N/A 09/20/2017   Procedure: AWAKE INTUBATION WITH ANESTHESIA WITH VIDEO ASSISTANCE;  Surgeon: Helayne Seminole, MD;  Location: [facility];  Service: ENT;  Laterality: N/A;  . TRANSTHORACIC ECHOCARDIOGRAM  08/2014   Normal LV size and function.  Mild LVH.  EF 55-60%.  Normal regional wall motion.  GR 1 DD.  Normal RV size and function .  [name] TUBAL LIGATION     with reversal in 1994  . VENTRAL HERNIA REPAIR     Social History   Occupational History  . Occupation: DISABLED  Tobacco Use  . Smoking status: Never Smoker  . Smokeless tobacco: Never Used  Substance and Sexual Activity  . Alcohol use: No    Alcohol/week: 0.0 standard drinks  . Drug use: No  . Sexual activity: Yes    Birth control/protection: Surgical

## 2019-10-17 DIAGNOSIS — Z01419 Encounter for gynecological examination (general) (routine) without abnormal findings: Secondary | ICD-10-CM | POA: Diagnosis not present

## 2019-10-17 DIAGNOSIS — Z124 Encounter for screening for malignant neoplasm of cervix: Secondary | ICD-10-CM | POA: Diagnosis not present

## 2019-10-17 DIAGNOSIS — R350 Frequency of micturition: Secondary | ICD-10-CM | POA: Diagnosis not present

## 2019-10-17 LAB — HM PAP SMEAR: HM Pap smear: NEGATIVE

## 2019-10-17 NOTE — Progress Notes (Signed)
[name], [place] [id number] [place] [id number] Phone: [phone] Fax: [phone]  [name], [place] [id number] [name] [id number] Phone: [phone] Fax: [phone]  [place], [place] - [address] [place] [address] Phone: [phone] Fax: [phone]  [place], [place] [address] # [place] [address] Phone: [phone] Fax: [phone]  [facility] [place], [place] [address] [place] [address] Phone: [phone] Fax: [phone]  [name] [place], [place] [address] [name] [address] Phone: [phone] Fax: [phone]      Your procedure is scheduled on January 14  Report to [facility] Main Entrance "A" at 1330 P.M., and check in at the Admitting office.  Call this number if you have problems the morning of surgery:  [phone]  Call [phone] if you have any questions prior to your surgery date Monday-Friday 8am-4pm    Remember:  Do not eat or drink after midnight the night before your surgery    Take these medicines the morning of surgery with A SIP OF WATER  albuterol (PROVENTIL HFA;VENTOLIN HFA) if needed, Please bring all inhalers with you the day of surgery.  BREO ELLIPTA buPROPion (WELLBUTRIN XL) carvedilol (COREG) dexlansoprazole (DEXILANT)  fluticasone (FLONASE) if needed gabapentin (NEURONTIN)  LORazepam (ATIVAN) if needed predniSONE (DELTASONE) venlafaxine XR (EFFEXOR-XR)   7 days prior to surgery STOP taking any Aspirin (unless otherwise instructed by your surgeon), Aleve, Naproxen, Ibuprofen, Motrin, Advil, Goody's, BC's, all herbal medications, fish oil,  and all vitamins.     WHAT DO I DO ABOUT MY DIABETES MEDICATION?   [name] Do not take oral diabetes medicines (pills) the morning of surgery. metFORMIN (GLUCOPHAGE)  . THE NIGHT BEFORE SURGERY, take ____35_______ units of ___insulin lispro (HUMALOG KWIKPEN)________insulin.      30 . THE MORNING OF SURGERY, take ____30_________ units of __Insulin Glargine (LANTUS SOLOSTAR)________insulin.  . The day of surgery, do not take other diabetes injectables, including Byetta (exenatide), Bydureon (exenatide ER), Victoza (liraglutide), or Trulicity (dulaglutide).  . If your CBG is greater than 220 mg/dL, you may take  of your sliding scale (correction) dose of insulin.   HOW TO MANAGE YOUR DIABETES BEFORE AND AFTER SURGERY  Why is it important to control my blood sugar before and after surgery? . Improving blood sugar levels before and after surgery helps healing and can limit problems. . A way of improving blood sugar control is eating a healthy diet by: o  Eating less sugar and carbohydrates o  Increasing activity/exercise o  Talking with your doctor about reaching your blood sugar goals . High blood sugars (greater than 180 mg/dL) can raise your risk of infections and slow your recovery, so you will need to focus on controlling your diabetes during the weeks before surgery. . Make sure that the doctor who takes care of your diabetes knows about your planned surgery including the date and location.  How do I manage my blood sugar before surgery? . Check your blood sugar at least 4 times a day, starting 2 days before surgery, to make sure that the level is not too high or low. . Check your blood sugar the morning  of your surgery when you wake up and every 2 hours until you get to the Short Stay unit. o If your blood sugar is less than 70 mg/dL, you will need to treat for low blood sugar: - Do not take insulin. - Treat a low blood sugar (less than 70 mg/dL) with  cup of clear juice (cranberry  or apple), 4 glucose tablets, OR glucose gel. - Recheck blood sugar in 15 minutes after treatment (to make sure it is greater than 70 mg/dL). If your blood sugar is not greater than 70 mg/dL on recheck, call [phone] for further instructions. . Report your blood sugar to the short stay nurse when you get to Short Stay.  . If you are admitted to the hospital after surgery: o Your blood sugar will be checked by the staff and you will probably be given insulin after surgery (instead of oral diabetes medicines) to make sure you have good blood sugar levels. o The goal for blood sugar control after surgery is 80-180 mg/dL.  The Morning of Surgery  Do not wear jewelry, make-up or nail polish.  Do not wear lotions, powders, or perfumes/colognes, or deodorant  Do not shave 48 hours prior to surgery.    Do not bring valuables to the hospital.  [facility] is not responsible for any belongings or valuables.  If you are a smoker, DO NOT Smoke 24 hours prior to surgery  If you wear a CPAP at night please bring your mask, tubing, and machine the morning of surgery   Remember that you must have someone to transport you home after your surgery, and remain with you for 24 hours if you are discharged the same day.   Please bring cases for contacts, glasses, hearing aids, dentures or bridgework because it cannot be worn into surgery.    Leave your suitcase in the car.  After surgery it may be brought to your room.  For patients admitted to the hospital, discharge time will be determined by your treatment team.  Patients discharged the day of surgery will not be allowed to drive home.    Special instructions:   [facility]- Preparing For Surgery  Before surgery, you can play an important role. Because skin is not sterile, your skin needs to be as free of germs as possible. You can reduce the number of germs on your skin by washing with CHG (chlorahexidine gluconate) Soap before surgery.  CHG is  an antiseptic cleaner which kills germs and bonds with the skin to continue killing germs even after washing.    Oral Hygiene is also important to reduce your risk of infection.  Remember - BRUSH YOUR TEETH THE MORNING OF SURGERY WITH YOUR REGULAR TOOTHPASTE  Please do not use if you have an allergy to CHG or antibacterial soaps. If your skin becomes reddened/irritated stop using the CHG.  Do not shave (including legs and underarms) for at least 48 hours prior to first CHG shower. It is OK to shave your face.  Please follow these instructions carefully.   1. Shower the NIGHT BEFORE SURGERY and the MORNING OF SURGERY with CHG Soap.   2. If you chose to wash your hair, wash your hair first as usual with your normal shampoo.  3. After you shampoo, rinse your hair and body thoroughly to remove the shampoo.  4. Use CHG as you would any other liquid soap. You can apply CHG directly to the skin and wash gently with a scrungie or a  clean washcloth.   5. Apply the CHG Soap to your body ONLY FROM THE NECK DOWN.  Do not use on open wounds or open sores. Avoid contact with your eyes, ears, mouth and genitals (private parts). Wash Face and genitals (private parts)  with your normal soap.   6. Wash thoroughly, paying special attention to the area where your surgery will be performed.  7. Thoroughly rinse your body with warm water from the neck down.  8. DO NOT shower/wash with your normal soap after using and rinsing off the CHG Soap.  9. Pat yourself dry with a CLEAN TOWEL.  10. Wear CLEAN PAJAMAS to bed the night before surgery, wear comfortable clothes the morning of surgery  11. Place CLEAN SHEETS on your bed the night of your first shower and DO NOT SLEEP WITH PETS.    Day of Surgery:  Please shower the morning of surgery with the CHG soap Do not apply any deodorants/lotions. Please wear clean clothes to the hospital/surgery center.   Remember to brush your teeth WITH YOUR REGULAR  TOOTHPASTE.   Please read over the following fact sheets that you were given.

## 2019-10-18 ENCOUNTER — Telehealth: Payer: Self-pay | Admitting: *Deleted

## 2019-10-18 ENCOUNTER — Encounter (HOSPITAL_COMMUNITY): Payer: Self-pay

## 2019-10-18 ENCOUNTER — Encounter (HOSPITAL_COMMUNITY)
Admission: RE | Admit: 2019-10-18 | Discharge: 2019-10-18 | Disposition: A | Discharge status: Home / Self Care | Payer: 59 | Source: Ambulatory Visit | Attending: Podiatry | Admitting: Podiatry

## 2019-10-18 ENCOUNTER — Telehealth: Payer: Self-pay | Admitting: Family Medicine

## 2019-10-18 ENCOUNTER — Other Ambulatory Visit: Payer: Self-pay

## 2019-10-18 DIAGNOSIS — Z01812 Encounter for preprocedural laboratory examination: Secondary | ICD-10-CM | POA: Insufficient documentation

## 2019-10-18 LAB — HEMOGLOBIN A1C
Hgb A1c MFr Bld: 9.2 % — ABNORMAL HIGH (ref 4.8–5.6)
Mean Plasma Glucose: 217.34 mg/dL

## 2019-10-18 LAB — GLUCOSE, CAPILLARY: Glucose-Capillary: 192 mg/dL — ABNORMAL HIGH (ref 70–99)

## 2019-10-18 LAB — CBC
HCT: 33.5 % — ABNORMAL LOW (ref 36.0–46.0)
Hemoglobin: 10.4 g/dL — ABNORMAL LOW (ref 12.0–15.0)
MCH: 27.2 pg (ref 26.0–34.0)
MCHC: 31 g/dL (ref 30.0–36.0)
MCV: 87.7 fL (ref 80.0–100.0)
Platelets: 371 10*3/uL (ref 150–400)
RBC: 3.82 MIL/uL — ABNORMAL LOW (ref 3.87–5.11)
RDW: 14.2 % (ref 11.5–15.5)
WBC: 12.4 10*3/uL — ABNORMAL HIGH (ref 4.0–10.5)
nRBC: 0 % (ref 0.0–0.2)

## 2019-10-18 LAB — SURGICAL PCR SCREEN
MRSA, PCR: NEGATIVE
Staphylococcus aureus: NEGATIVE

## 2019-10-18 LAB — BASIC METABOLIC PANEL
Anion gap: 12 (ref 5–15)
BUN: 22 mg/dL — ABNORMAL HIGH (ref 6–20)
CO2: 28 mmol/L (ref 22–32)
Calcium: 9.5 mg/dL (ref 8.9–10.3)
Chloride: 102 mmol/L (ref 98–111)
Creatinine, Ser: 0.97 mg/dL (ref 0.44–1.00)
GFR calc Af Amer: >60 mL/min (ref >60)
GFR calc non Af Amer: >60 mL/min (ref >60)
Glucose, Bld: 195 mg/dL — ABNORMAL HIGH (ref 70–99)
Potassium: 4.4 mmol/L (ref 3.5–5.1)
Sodium: 142 mmol/L (ref 135–145)

## 2019-10-18 NOTE — Telephone Encounter (Signed)
App has been put in form not received yet

## 2019-10-18 NOTE — Progress Notes (Signed)
PCP - Dr. Garret Reddish Cardiologist - Dr. [name] Pulmonologist: Dr. Leona Carry  PPM/ICD - Denies  Chest x-ray - N/A EKG - 09/03/2019 Stress Test - 03/09/2013 ECHO - 09/02/2014 Cardiac Cath - 03/11/2017  Sleep Study - Yes, negative for sleep apnea in the past   Fasting Blood Sugar - 110-130 Checks Blood Sugar _3____ times a day  Blood Thinner Instructions: N/A Aspirin Instructions: Patient instructed to call MD for instructions on when to stop taking aspirin.  ERAS Protcol - N/A  COVID TEST- No test required, Patient tested positive for covid 09/12/2019   Coronavirus Screening  Have you experienced the following symptoms:  Cough yes/no: No Fever (>100.54F)  yes/no: No Runny nose yes/no: No Sore throat yes/no: No Difficulty breathing/shortness of breath  yes/no: No  Have you or a family member traveled in the last 14 days and where? yes/no: No   If the patient indicates "YES" to the above questions, their PAT will be rescheduled to limit the exposure to others and, the surgeon will be notified. THE PATIENT WILL NEED TO BE ASYMPTOMATIC FOR 14 DAYS.   If the patient is not experiencing any of these symptoms, the PAT nurse will instruct them to NOT bring anyone with them to their appointment since they may have these symptoms or traveled as well.   Please remind your patients and families that hospital visitation restrictions are in effect and the importance of the restrictions.     Anesthesia review: Yes, cardiac hx, cardiac clearance  Patient denies shortness of breath, fever, cough and chest pain at PAT appointment   All instructions explained to the patient, with a verbal understanding of the material. Patient agrees to go over the instructions while at home for a better understanding. Patient also instructed to self quarantine after being tested for COVID-19. The opportunity to ask questions was provided.

## 2019-10-18 NOTE — Telephone Encounter (Signed)
Pt husband is calling - Sunday dropped off papers for Dr Yong Channel to fill out saying that she was ok to have foot surgery. They haven't heard anything about the paperwork - he is calling to follow up on it.  Dr Amalia Hailey - [facility] Ph: [phone]  Please call the surgery scheduler if you do not have papers.  Her surgery is scheduled for next week.

## 2019-10-18 NOTE — Telephone Encounter (Signed)
I'm calling you in regards to your surgery that's scheduled for 10/25/2019.  I received a message from the PA at [facility].  She said they received information for your history and physical form but it is outdated.  The visit notes were from 07/04/2019.  You will need to schedule an appointment with your primary care physician or get the physician that recently treated you for Cystitis to complete your history and physical form.  If it's not completed, we must cancel your surgery.  "I'll work on it right now."

## 2019-10-18 NOTE — Telephone Encounter (Signed)
Yes.  I called [facility] and left a message for the surgery scheduler to call me back so we can get the paperwork faxed over.  If you can over ride the 1:00 on Monday, they can come for a surgical clearance appt with [facility].

## 2019-10-18 NOTE — Telephone Encounter (Signed)
Is this the one you asked about?

## 2019-10-19 ENCOUNTER — Telehealth: Payer: Self-pay | Admitting: *Deleted

## 2019-10-19 NOTE — Telephone Encounter (Signed)
Form received placed in holding for next week appointment

## 2019-10-19 NOTE — Anesthesia Preprocedure Evaluation (Addendum)
Anesthesia Evaluation  Patient identified by MRN, date of birth, ID band Patient awake    Reviewed: Allergy & Precautions, NPO status , Patient's Chart, lab work & pertinent test results  Airway Mallampati: II  TM Distance: >3 FB Neck ROM: Full    Dental  (+) Upper Dentures, Lower Dentures   Pulmonary asthma , sleep apnea , COPD,  COPD inhaler,  Pulmonary sarcoidosis    Pulmonary exam normal breath sounds clear to auscultation       Cardiovascular hypertension, Pt. on home beta blockers and Pt. on medications + angina with exertion +CHF  Normal cardiovascular exam Rhythm:Regular Rate:Normal     Neuro/Psych PSYCHIATRIC DISORDERS Anxiety Depression negative neurological ROS     GI/Hepatic Neg liver ROS, GERD  Medicated and Controlled,  Endo/Other  diabetes, Insulin DependentMorbid obesity  Renal/GU negative Renal ROS     Musculoskeletal GOUT Osteomyelitis of left foot   Abdominal   Peds  Hematology HLD   Anesthesia Other Findings HAMMER TOE  Reproductive/Obstetrics                           Anesthesia Physical Anesthesia Plan  ASA: III  Anesthesia Plan: General   Post-op Pain Management:    Induction: Intravenous  PONV Risk Score and Plan: 3 and Ondansetron, Dexamethasone, Midazolam and Treatment may vary due to age or medical condition  Airway Management Planned: LMA  Additional Equipment:   Intra-op Plan:   Post-operative Plan: Extubation in OR  Informed Consent: I have reviewed the patients History and Physical, chart, labs and discussed the procedure including the risks, benefits and alternatives for the proposed anesthesia with the patient or authorized representative who has indicated his/her understanding and acceptance.       Plan Discussed with: CRNA  Anesthesia Plan Comments: (Reviewed PAT note written by Myra Gianotti, PA-C. )       Anesthesia Quick  Evaluation

## 2019-10-19 NOTE — Progress Notes (Addendum)
Anesthesia Chart Review:  Case: [id number] Date/Time: 10/25/19 1515   Procedure: HAMMER TOE REPAIR SECOND, THIRD, FOUTH (Left Toe)   Anesthesia type: General   Pre-op diagnosis: HAMMER TOE   Location: [facility] ROOM 04 / [facility]   Surgeons: Edrick Kins, DPM      DISCUSSION:Patient is a 59 year old female scheduled for the above procedure. She had H&P and medical clearance visit with PCP Dr. Yong Channel on 10/22/19--He felt she was medically maximized for surgery from cardiac and pulmonary perspective; however, he advised endocrinology input given her elevated A1c 9.2% (although down from 9.9% in September after insulin adjustments) and concern for inhibited wound healing. She was cleared from an endocrinologist standpoint by Dr. Loanne Drilling following 10/24/19 visit. She also has cardiology clearance from Dr. Ellyn Hack.   History includes never smoker, chronic diastolicCHF, HTN, DM2, hyperlipidemia, COPD, pulmonary sarcoidosis,vocal cord dysfunction,anemia, GERD, epiglottitis (09/2017, details below), COVID-19 + 09/12/19 (s/p bamlanivimab).Last BMI is consistent with morbid obesity.  - She is s/p hammer toe correction 2nd, 3rd, 4th, and 5th toes of right foot 04/12/19.  -  She is s/p left shoulder arthroscopy, debridement and decompression for rotator cuff tear on 03/14/19.  - Hospitalized 12/11-12/18/18 for epiglottitis, supraglottitis, and acute hypoxemic respiratory failure.Presented with worsening sore throat x 2 days. Strep negative.She requiredawakefiberoptic intubation through right nare with 6.5 ETT.She remained intubated until 09/23/17 when she was taken back to the operating room and extubated under TFl examination over a Cook catheter. She did well after extubation and was transferred to a step down unit on 09/24/17.  Presurgical COVID-19 test not done because she had positive test within the past 90 days (09/12/19, s/p bamlanivimab infusion 09/18/19). By med list, she is on prednisone 5 mg daily.As  above, historically DM not well controlled with A1c in the 9 range, but some improvement after insulin adjustments. She reported fasting home CBGs ~ 110-130's. She will get a CBG on arrival. Anesthesia team to evaluate on the day of surgery.    VS: BP (!) 140/47   Pulse 71   Temp 37.1 C (Oral)   Resp 18   Ht 5\' 6"  (1.676 m)   Wt 113.5 kg   SpO2 98%   BMI 40.37 kg/m    PROVIDERS: [name], MDis PCP.   [name], MD is cardiologist. Last visit 09/01/19. He wrote, "Remained stable from a cardiac standpoint.  No active symptoms of heart failure or angina.  Her pulmonary status also seems to be relatively stable.  At this point I would not recommend any further evaluation prior to her hammertoe surgery. She is on stable medications.  Not currently on any anticoagulation medication that would need to be held. From a cardiac standpoint would be low risk patient for low risk surgery."  - Renato Shin, MD is endocrinologist. Last visit 10/23/18. He wrote, "your surgical risk is low and outweighed by the potential benefit of the surgery.  You are therefore medically cleared (from the standpoint of the diabetes)".  Loreta Ave, MD is pulmonologist ([facility]). Last visit 10/02/19. No recent acute asthma exacerbations. Low activity of pulmonoary sarcoidosis on last scan but restrictive lung function. Note mentions last DLCO and FVC were around 67%. He was hoping to wean prednisone in the future, if possible. She is on Breo Ellipta daily, Tussionex PRN, Flonase daily PRN. Six month follow-up planned.    Lind Guest, MD is ENT ([place]). Supraglottitis and oropharyngeal dysphagia from 09/2017 felt  related to infection and not mass/malignancy. Office transnasal fiberoptic laryngoscopy did not show any suspicious findings. PRN follow-up after 1/2/219 visit.   LABS: Preoperative labs noted. A1c elevated, but down to 9.2%. Reported fasting CBGs <  200.  (all labs ordered are listed, but only abnormal results are displayed)  Labs Reviewed  HEMOGLOBIN A1C - Abnormal; Notable for the following components:      Result Value   Hgb A1c MFr Bld 9.2 (*)    All other components within normal limits  BASIC METABOLIC PANEL - Abnormal; Notable for the following components:   Glucose, Bld 195 (*)    BUN 22 (*)    All other components within normal limits  CBC - Abnormal; Notable for the following components:   WBC 12.4 (*)    RBC 3.82 (*)    Hemoglobin 10.4 (*)    HCT 33.5 (*)    All other components within normal limits  GLUCOSE, CAPILLARY - Abnormal; Notable for the following components:   Glucose-Capillary 192 (*)    All other components within normal limits  SURGICAL PCR SCREEN    Spirometry 04/15/17: FVC 2.2 (61%) FEV1 1.8 (64%) FEV1/FVC 81% (103%) FEF25-75% 1.9 (73%) Interpretation: Moderate restriction.   IMAGES: Left humerus/shoulder xray 07/22/19: IMPRESSION: Oblique fracture through the neck of the left proximal humerus with mild displacement. - She followed up with orthopedic surgeon Meridee Score, MD, last 08/09/19 with plans to wean out of sling and start PT for ROM/strengthening.     EKG: 08/30/19 ([facility]): Normal sinus rhythm Cannot rule out anterior infarct, age undetermined Abnormal ECG   CV: Cardiac Event Monitor 07/20/18-08/18/18:  Mostly sinus rhythm: Rates 39 to 220 bpm. Average 79 bpm.   Roughly 2% PVCs and less than 1% PACs.   No arrhythmias: No A. fib, PAT, MAT, SVT or VT.   Relatively normal study with occasional PVCs.   Cardiac cath 03/11/17(done for intermediate risk stress test): Conclusion: 1. Angiographically normal left main, RCA, and LCx 2. Minimal nonobstructive irregularity in the LAD 3. Normal LVEDP  Nuclear stress test 03/10/17: Impression: 1. There is a moderate-sized stress-induced perfusion defect at the apex as well as ill-defined stress-induced perfusion defect  in the lateral. 2. Normal left ventricular wall motion. 3. Left ventricular ejection fraction 55% 4. Non invasive risk stratification: Intermediate  Echo 09/02/14: Study Conclusions: - Left ventricle: The cavity size was normal. Wall thickness wasincreased in a pattern of mild LVH. Systolic function was normal.The estimated ejection fraction was in the range of 55% to 60%.Wall motion was normal; there were no regional wall motionabnormalities. Doppler parameters are consistent with abnormalleft ventricular relaxation (grade 1 diastolic dysfunction). - Aortic valve: There was no stenosis. - Mitral valve: Mildly calcified annulus. There was no significantregurgitation. - Left atrium: The atrium was mildly dilated. - Right ventricle: The cavity size was normal. Systolic functionwas normal. - Inferior vena cava: The vessel was normal in size. The respirophasic diameter changes were in the normal range (>= 50%),consistent with normal central venous pressure. Impressions: - Normal LV size with mild LV hypertrophy. EF 55-60%. Normal RV size and systolic function. No significant valvular abnormalities.   Past Medical History:  Diagnosis Date  . Abnormal SPEP 04/17/2014  . Acute left ankle pain 01/26/2017  . ANEMIA-UNSPECIFIED 09/18/2009  . Anxiety   . Arthritis   . CHF (congestive heart failure) ([place])   . Chronic diastolic heart failure, NYHA class 2 (HCC)    Normal LVEDP by May 2018  . COPD (chronic  obstructive pulmonary disease) ([place])   . Depression   . DIABETES MELLITUS, TYPE II 08/21/2006  . Diabetic osteomyelitis ([place]) 05/29/2015  . Fracture of 5th metatarsal    non union  . GERD [id number]   Had fundoplication  . GOUT 08/20/2010  . Hammer toe of right foot    2-5th toes  . Hx of umbilical hernia repair   . HYPERLIPIDEMIA 08/21/2006  . HYPERTENSION 08/21/2006  . Infection of wound due to methicillin resistant Staphylococcus aureus (MRSA)   . Internal hemorrhoids    . Multiple allergies 10/14/2016  . OBESITY 06/04/2009  . Onychomycosis 10/27/2015  . Osteomyelitis of left foot ([place]) 05/29/2015  . Pulmonary sarcoidosis ([place])    Followed locally by pulmonology, but also by [facility] Casper Harrison [facility]  . Right knee pain 01/26/2017  . Vocal cord dysfunction   . Wears partial dentures     Past Surgical History:  Procedure Laterality Date  . ABDOMINAL HYSTERECTOMY    . APPENDECTOMY    . BLADDER SUSPENSION  11/11/2011   Procedure: TRANSVAGINAL TAPE (TVT) PROCEDURE;  Surgeon: Olga Millers, MD;  Location: [facility];  Service: Gynecology;  Laterality: N/A;  . CAROTIDS  02/18/11   CAROTID DUPLEX; VERTEBRALS ARE PATENT WITH ANTEGRADE FLOW. ICA/CCA RATIO 1.61 ON RIGHT AND 0.75 ON LEFT  . CHOLECYSTECTOMY  1984  . CYSTOSCOPY  11/11/2011   Procedure: CYSTOSCOPY;  Surgeon: Olga Millers, MD;  Location: [facility];  Service: Gynecology;  Laterality: N/A;  . EXTUBATION (ENDOTRACHEAL) IN OR N/A 09/23/2017   Procedure: EXTUBATION (ENDOTRACHEAL) IN OR;  Surgeon: Helayne Seminole, MD;  Location: [facility];  Service: ENT;  Laterality: N/A;  . FIBEROPTIC LARYNGOSCOPY AND TRACHEOSCOPY N/A 09/23/2017   Procedure: FLEXIBLE FIBEROPTIC LARYNGOSCOPY;  Surgeon: Helayne Seminole, MD;  Location: [facility];  Service: ENT;  Laterality: N/A;  . FRACTURE SURGERY     foot  . HALLUX FUSION Left 10/02/2018   Procedure: HALLUX ARTHRODESIS;  Surgeon: Edrick Kins, DPM;  Location: [facility];  Service: Podiatry;  Laterality: Left;  . HAMMER TOE SURGERY Left 10/02/2018   Procedure: HAMMER TOE CORRECTION 2ND 3RD 4RD FIFTH TOE;  Surgeon: Edrick Kins, DPM;  Location: [facility];  Service: Podiatry;  Laterality: Left;  . HAMMER TOE SURGERY Right 04/12/2019   Procedure: HAMMER TOE CORRECTION, 2ND, 3RD, 4TH AND 5TH TOES OF RIGHT FOOT;  Surgeon: Edrick Kins, DPM;  Location: [facility];  Service: Podiatry;  Laterality: Right;  . HERNIA REPAIR    . I & D EXTREMITY Left 06/27/2015   Procedure: Partial  Excision Left Calcaneus, Place Antibiotic Beads, and Wound VAC;  Surgeon: Newt Minion, MD;  Location: [facility];  Service: Orthopedics;  Laterality: Left;  . JOINT REPLACEMENT    . KNEE ARTHROSCOPY     right  . LEFT AND RIGHT HEART CATHETERIZATION WITH CORONARY ANGIOGRAM N/A 04/23/2013   Procedure: LEFT AND RIGHT HEART CATHETERIZATION WITH CORONARY ANGIOGRAM;  Surgeon: Leonie Man, MD;  Location: [facility];  Service: Cardiovascular;  Laterality: N/A;  . LEFT HEART CATH AND CORONARY ANGIOGRAPHY N/A 03/11/2017   Procedure: Left Heart Cath and Coronary Angiography;  Surgeon: Sherren Mocha, MD;  Location: [facility] CV LAB; angiographically minimal CAD in the LAD otherwise normal.;  Normal LVEDP.  FALSE POSITIVE MYOVIEW  . LEFT HEART CATH AND CORONARY ANGIOGRAPHY  07/20/2010   LVEF 50-55% WITH VERY MILD GLOBAL HYPOKINESIA; ESSENTIALLY NORMAL CORONARY ARTERIES; NORMAL LV FUNCTION  . METATARSAL OSTEOTOMY WITH  OPEN REDUCTION INTERNAL FIXATION (ORIF) METATARSAL WITH FUSION Left 04/09/2014   Procedure: LEFT FOOT FRACTURE OPEN TREATMENT METATARSAL INCLUDES INTERNAL FIXATION EACH;  Surgeon: Lorn Junes, MD;  Location: [facility];  Service: Orthopedics;  Laterality: Left;  . NISSEN FUNDOPLICATION  [id number]  . NM MYOVIEW LTD  03/09/2013   Lexiscan --> EF 50%; NORMAL MYOCARDIAL PERFUSION STUDY - breast attenuation  . NM MYOVIEW LTD  03/10/2017   : Moderate size "stress-induced "perfusion defect at the apex as well as "ill-defined stress-induced perfusion defect in the lateral wall.  EF 55%.  INTERMEDIATE risk. -->  FALSE POSITIVE  . Right and left CARDIAC CATHETERIZATION  04/23/2013   Angiographic normal coronaries; LVEDP 20 mmHg, PCWP 12-14 mmHg, RAP 12 mmHg.; Fick CO/CI 4.9/2.2  . SHOULDER ARTHROSCOPY Left 03/14/2019   Procedure: LEFT SHOULDER ARTHROSCOPY, DEBRIDEMENT, AND DECOMPRESSION;  Surgeon: Newt Minion, MD;  Location: [facility];  Service: Orthopedics;  Laterality: Left;  . TOTAL KNEE  ARTHROPLASTY Right 06/29/2017   Procedure: RIGHT TOTAL KNEE ARTHROPLASTY;  Surgeon: Newt Minion, MD;  Location: [facility];  Service: Orthopedics;  Laterality: Right;  . TOTAL KNEE ARTHROPLASTY Left 12/07/2017  . TOTAL KNEE ARTHROPLASTY Left 12/07/2017   Procedure: LEFT TOTAL KNEE ARTHROPLASTY;  Surgeon: Newt Minion, MD;  Location: [facility];  Service: Orthopedics;  Laterality: Left;  . TRACHEOSTOMY TUBE PLACEMENT N/A 09/20/2017   Procedure: AWAKE INTUBATION WITH ANESTHESIA WITH VIDEO ASSISTANCE;  Surgeon: Helayne Seminole, MD;  Location: [facility];  Service: ENT;  Laterality: N/A;  . TRANSTHORACIC ECHOCARDIOGRAM  08/2014   Normal LV size and function.  Mild LVH.  EF 55-60%.  Normal regional wall motion.  GR 1 DD.  Normal RV size and function .  [name] TUBAL LIGATION     with reversal in 1994  . VENTRAL HERNIA REPAIR      MEDICATIONS: . albuterol (PROVENTIL HFA;VENTOLIN HFA) 108 (90 Base) MCG/ACT inhaler  . allopurinol (ZYLOPRIM) 100 MG tablet  . aspirin EC 81 MG tablet  . atorvastatin (LIPITOR) 40 MG tablet  . BREO ELLIPTA 200-25 MCG/INH AEPB  . buPROPion (WELLBUTRIN XL) 300 MG 24 hr tablet  . carvedilol (COREG) 25 MG tablet  . chlorpheniramine-HYDROcodone (TUSSIONEX PENNKINETIC ER) 10-8 MG/5ML SUER  . cholecalciferol (VITAMIN D) 25 MCG (1000 UT) tablet  . dexlansoprazole (DEXILANT) 60 MG capsule  . diclofenac Sodium (VOLTAREN) 1 % GEL  . diltiazem (CARDIZEM CD) 120 MG 24 hr capsule  . famotidine (PEPCID) 20 MG tablet  . fenofibrate 160 MG tablet  . fluticasone (FLONASE) 50 MCG/ACT nasal spray  . furosemide (LASIX) 40 MG tablet  . gabapentin (NEURONTIN) 100 MG capsule  . glucose blood (CONTOUR NEXT TEST) test strip  . icosapent Ethyl (VASCEPA) 1 g capsule  . Insulin Glargine (LANTUS SOLOSTAR) 100 UNIT/ML Solostar Pen  . insulin lispro (HUMALOG KWIKPEN) 100 UNIT/ML KwikPen  . Insulin Pen Needle 33G X 4 MM MISC  . isosorbide mononitrate (IMDUR) 30 MG 24 hr tablet  . KLOR-CON M20 20 MEQ  tablet  . lactulose (CHRONULAC) 10 GM/15ML solution  . linaclotide (LINZESS) 290 MCG CAPS capsule  . LORazepam (ATIVAN) 0.5 MG tablet  . metFORMIN (GLUCOPHAGE) 1000 MG tablet  . Microlet Lancets MISC  . nitroGLYCERIN (NITROSTAT) 0.4 MG SL tablet  . ondansetron (ZOFRAN-ODT) 4 MG disintegrating tablet  . predniSONE (DELTASONE) 5 MG tablet  . predniSONE (DELTASONE) 50 MG tablet  . telmisartan (MICARDIS) 20 MG tablet  . triamcinolone cream (KENALOG) 0.1 %  .  venlafaxine XR (EFFEXOR-XR) 75 MG 24 hr capsule   No current facility-administered medications for this encounter.  Instructed by PAT RN to follow-up with surgeon regarding perioperative ASA instructions.   Myra Gianotti, PA-C Surgical Short Stay/Anesthesiology [facility] Phone [phone] [facility] Phone [phone] 10/24/2019 3:11 PM

## 2019-10-19 NOTE — Telephone Encounter (Signed)
DOS 10/25/2019 HAMMER TOE REPAIR 2,3,4 LEFT FOOT - [id number]  [employer]: Eligibility Date - 10/12/2019 - 10/10/2020  Plan Deductible Per Calendar Year $84.54 of $2,500.00 Met  Remaining: $2,415.46  Out-of-Pocket Maximum Per Calendar Year $84.54 of $6,350.00 Met  Remaining: $6,265.46  Co-Insurance 66%  Pre-certification is REQUIRED. Status: PENDED  Reason: 1.Disposition pending review  Tracking #: [id number]

## 2019-10-20 NOTE — H&P (View-Only) (Signed)
Called Patient to inform she did not need to get her Covid test on Monday.  She tested + 09/12/2019.  Per [place] she doesn't need to be retested for 90 after positive result but can still have procedure.

## 2019-10-20 NOTE — Progress Notes (Signed)
Called Patient to inform she did not need to get her Covid test on Monday.  She tested + 09/12/2019.  Per [place] she doesn't need to be retested for 90 after positive result but can still have procedure.

## 2019-10-22 ENCOUNTER — Other Ambulatory Visit: Payer: Self-pay

## 2019-10-22 ENCOUNTER — Telehealth: Payer: Self-pay

## 2019-10-22 ENCOUNTER — Other Ambulatory Visit (HOSPITAL_COMMUNITY): Payer: 59

## 2019-10-22 ENCOUNTER — Encounter: Payer: Self-pay | Admitting: Endocrinology

## 2019-10-22 ENCOUNTER — Ambulatory Visit (INDEPENDENT_AMBULATORY_CARE_PROVIDER_SITE_OTHER): Payer: 59 | Admitting: Family Medicine

## 2019-10-22 ENCOUNTER — Encounter: Payer: Self-pay | Admitting: Family Medicine

## 2019-10-22 VITALS — BP 136/68 | HR 60 | Temp 97.2°F | Ht 66.0 in | Wt 249.6 lb

## 2019-10-22 DIAGNOSIS — Z01818 Encounter for other preprocedural examination: Secondary | ICD-10-CM | POA: Diagnosis not present

## 2019-10-22 DIAGNOSIS — E785 Hyperlipidemia, unspecified: Secondary | ICD-10-CM | POA: Diagnosis not present

## 2019-10-22 DIAGNOSIS — F3342 Major depressive disorder, recurrent, in full remission: Secondary | ICD-10-CM | POA: Diagnosis not present

## 2019-10-22 DIAGNOSIS — I152 Hypertension secondary to endocrine disorders: Secondary | ICD-10-CM

## 2019-10-22 DIAGNOSIS — E1169 Type 2 diabetes mellitus with other specified complication: Secondary | ICD-10-CM

## 2019-10-22 DIAGNOSIS — I110 Hypertensive heart disease with heart failure: Secondary | ICD-10-CM | POA: Diagnosis not present

## 2019-10-22 DIAGNOSIS — I10 Essential (primary) hypertension: Secondary | ICD-10-CM | POA: Diagnosis not present

## 2019-10-22 DIAGNOSIS — E1159 Type 2 diabetes mellitus with other circulatory complications: Secondary | ICD-10-CM | POA: Diagnosis not present

## 2019-10-22 DIAGNOSIS — I5032 Chronic diastolic (congestive) heart failure: Secondary | ICD-10-CM

## 2019-10-22 MED ORDER — PAROXETINE MESYLATE 7.5 MG PO CAPS: 7.5000 mg | ORAL_CAPSULE | Freq: Every day | ORAL | 3 refills | Status: DC

## 2019-10-22 NOTE — Telephone Encounter (Signed)
Patients husband called in wanting Dr to send something to her Podiatrist  doctor so she can be cleared to have surgery. Patient is due to have her surgery this Thursday 10/25/19.  Podiatrist Doctor is, Dr. Amalia Hailey  [facility]  Fax number: [phone]   Please call patient and advise

## 2019-10-22 NOTE — Patient Instructions (Addendum)
Health Maintenance Due  Topic Date Due  . DEXA SCAN  Patient will make app at check out.  01-30-1961  . PAP SMEAR-had done last week at [facility]YN will send for notes 12/01/2019   Call endocrine I would like their input before surgery. Otherwise best of luck with surgery  Thrilled you did so well despite having covid 19 !  Recommended follow up: cancel your visit on 22nd- reschedule 3-4 months

## 2019-10-22 NOTE — Telephone Encounter (Signed)
F/u needed for this

## 2019-10-22 NOTE — Telephone Encounter (Signed)
Discussed surgical clearance request with Dr. Loanne Drilling. Unfortunately, clearance cannot be provided without an appt. Routing this message back to the front desk for scheduling purposes.

## 2019-10-22 NOTE — Progress Notes (Signed)
Phone [phone] In person visit   Subjective:   [name] is a 60 y.o. year old very pleasant female patient who presents for/with See problem oriented charting Chief Complaint  Patient presents with  . surgical clearance   This visit occurred during the SARS-CoV-2 public health emergency.  Safety protocols were in place, including screening questions prior to the visit, additional usage of staff PPE, and extensive cleaning of exam room while observing appropriate contact time as indicated for disinfecting solutions.   Past Medical History-  Patient Active Problem List   Diagnosis Date Noted  . Severe persistent asthma 01/04/2019    Priority: High  . Constipation 04/21/2018    Priority: High  . Obstructive chronic bronchitis without exacerbation ([place]) 09/18/2013    Priority: High  . Chest pain 04/11/2013    Priority: High  . Hypertensive heart disease with chronic diastolic congestive heart failure ([place])     Priority: High  . Sarcoidosis of lung ([place]) 04/10/2007    Priority: High  . Type II diabetes mellitus with renal manifestations ([place]) 08/21/2006    Priority: High  . Epiglottitis     Priority: Medium  . Osteoarthritis of right knee 01/26/2017    Priority: Medium  . CKD (chronic kidney disease) stage 3, GFR 30-59 ml/min 04/27/2015    Priority: Medium  . History of osteomyelitis 03/27/2015    Priority: Medium  . Fatty liver 09/30/2014    Priority: Medium  . Major depression in full remission ([place])     Priority: Medium  . Gout 08/20/2010    Priority: Medium  . Anemia 09/18/2009    Priority: Medium  . Sleep apnea 04/21/2009    Priority: Medium  . Hyperlipidemia associated with type 2 diabetes mellitus ([place]) 08/21/2006    Priority: Medium  . Hypertension associated with diabetes ([place]) 08/21/2006    Priority: Medium  . Diabetic neuropathy ([place]) 02/20/2019    Priority: Low  . Rapid palpitations 07/12/2018    Priority: Low  . Total knee replacement  status, left 12/07/2017    Priority: Low  . Dysphagia 09/20/2017    Priority: Low  . Plantar fasciitis, right 07/13/2017    Priority: Low  . S/P total knee arthroplasty, right 06/29/2017    Priority: Low  . Osteoarthrosis, localized, primary, knee, right     Priority: Low  . Sprain of calcaneofibular ligament of right ankle 06/06/2017    Priority: Low  . Onychomycosis 10/27/2015    Priority: Low  . MRSA (methicillin resistant staph aureus) culture positive 03/27/2015    Priority: Low  . Hot flashes 07/15/2014    Priority: Low  . Abnormal SPEP 04/17/2014    Priority: Low  . Fracture of left leg 04/17/2014    Priority: Low  . Cushingoid side effect of steroids ([place]) 04/17/2014    Priority: Low  . Internal hemorrhoids     Priority: Low  . Preoperative clearance 03/25/2014    Priority: Low  . Solitary pulmonary nodule, on CT 02/2013 - stable over 2 years in 2015 02/20/2013    Priority: Low  . Morbid obesity ([place]) 06/04/2009    Priority: Low  . GERD 08/21/2006    Priority: Low  . Nontraumatic complete tear of left rotator cuff   . Impingement syndrome of left shoulder   . Subcortical microvascular ischemic occlusive disease 07/12/2018  . Impingement of left ankle joint 06/26/2018    Medications- reviewed and updated Current Outpatient Medications  Medication Sig Dispense Refill  . albuterol (PROVENTIL  HFA;VENTOLIN HFA) 108 (90 Base) MCG/ACT inhaler Inhale 1-2 puffs into the lungs every 6 (six) hours as needed for wheezing or shortness of breath. 8 g 5  . allopurinol (ZYLOPRIM) 100 MG tablet Take 100 mg by mouth every evening.     [name] aspirin EC 81 MG tablet Take 81 mg by mouth daily.     [name] atorvastatin (LIPITOR) 40 MG tablet TAKE 1 TABLET BY MOUTH EVERY DAY (Patient taking differently: Take 40 mg by mouth at bedtime. ) 90 tablet 3  . BREO ELLIPTA 200-25 MCG/INH AEPB TAKE 1 PUFF BY MOUTH EVERY DAY (Patient taking differently: Inhale 1 puff into the lungs daily. ) 60 each 5  .  buPROPion (WELLBUTRIN XL) 300 MG 24 hr tablet TAKE 1 TABLET BY MOUTH EVERY DAY (Patient taking differently: Take 300 mg by mouth daily. ) 90 tablet 3  . carvedilol (COREG) 25 MG tablet TAKE 1 TABLET (25 MG TOTAL) BY MOUTH 2 (TWO) TIMES DAILY WITH A MEAL. 180 tablet 3  . chlorpheniramine-HYDROcodone (TUSSIONEX PENNKINETIC ER) 10-8 MG/5ML SUER Take 5 mLs by mouth 2 (two) times daily. (Patient taking differently: Take 5 mLs by mouth every 12 (twelve) hours as needed for cough. ) 140 mL 0  . cholecalciferol (VITAMIN D) 25 MCG (1000 UT) tablet Take 1,000 Units by mouth 2 (two) times a day.    [name] dexlansoprazole (DEXILANT) 60 MG capsule Take 1 capsule (60 mg total) by mouth daily. 30 capsule 5  . diclofenac Sodium (VOLTAREN) 1 % GEL Apply 1 application topically 4 (four) times daily as needed (pain.).    [name] diltiazem (CARDIZEM CD) 120 MG 24 hr capsule TAKE 1 CAPSULE BY MOUTH EVERY DAY (Patient taking differently: Take 120 mg by mouth every evening. ) 90 capsule 0  . famotidine (PEPCID) 20 MG tablet Take 1 tablet (20 mg total) by mouth at bedtime. 30 tablet 3  . fenofibrate 160 MG tablet TAKE 1 TABLET BY MOUTH EVERY DAY (Patient taking differently: Take 160 mg by mouth at bedtime. ) 90 tablet 1  . fluticasone (FLONASE) 50 MCG/ACT nasal spray PLACE 2 SPRAYS INTO BOTH NOSTRILS DAILY AS NEEDED FOR ALLERGIES 48 mL 1  . furosemide (LASIX) 40 MG tablet TAKE 1 TABLET BY MOUTH TWICE A DAY (Patient taking differently: Take 40 mg by mouth 2 (two) times daily. ) 180 tablet 3  . gabapentin (NEURONTIN) 100 MG capsule TAKE 1 CAPSULE BY MOUTH THREE TIMES A DAY (Patient taking differently: Take 100-200 mg by mouth See admin instructions. Take 1 capsule (100 mg) by mouth in the morning & 2 capsules (200 mg) by mouth at night.) 270 capsule 3  . glucose blood (CONTOUR NEXT TEST) test strip 1 each by Other route 4 (four) times daily. And lancets 4/day 400 each 3  . icosapent Ethyl (VASCEPA) 1 g capsule Take 2 capsules (2 g total)  by mouth 2 (two) times daily. 120 capsule 11  . Insulin Glargine (LANTUS SOLOSTAR) 100 UNIT/ML Solostar Pen Inject 60 Units into the skin daily. 18 mL 2  . insulin lispro (HUMALOG KWIKPEN) 100 UNIT/ML KwikPen Inject 0.3 mLs (30 Units total) into the skin daily with supper. (Patient taking differently: Inject 35 Units into the skin daily with supper. ) 5 pen 11  . Insulin Pen Needle 33G X 4 MM MISC 1 each by Does not apply route 4 (four) times daily. 200 each 11  . isosorbide mononitrate (IMDUR) 30 MG 24 hr tablet Take 1 tablet (30 mg total) by mouth  at bedtime. 90 tablet 1  . KLOR-CON M20 20 MEQ tablet TAKE 1 & 1/2 TABLETS BY MOUTH TWICE A DAY (Patient taking differently: Take 30 mEq by mouth 2 (two) times a day. ) 270 tablet 2  . lactulose (CHRONULAC) 10 GM/15ML solution Take 10 g by mouth daily as needed (constipation).     [name] linaclotide (LINZESS) 290 MCG CAPS capsule Take 1 capsule (290 mcg total) by mouth daily before breakfast. (Patient taking differently: Take 290 mcg by mouth daily as needed (constipation). ) 30 capsule 11  . LORazepam (ATIVAN) 0.5 MG tablet Take 0.5 mg by mouth every 12 (twelve) hours as needed for anxiety.    . metFORMIN (GLUCOPHAGE) 1000 MG tablet TAKE 1 TABLET BY MOUTH 2 TIMES DAILY WITH A MEAL. MUST CALL TO SCHEDULE AN APPT. (Patient taking differently: Take 1,000 mg by mouth 2 (two) times daily. ) 180 tablet 1  . Microlet Lancets MISC 1 each by Does not apply route 2 (two) times daily. E11.9 100 each 2  . nitroGLYCERIN (NITROSTAT) 0.4 MG SL tablet Place 1 tablet (0.4 mg total) under the tongue every 5 (five) minutes as needed for chest pain. Reported on 01/05/2016 25 tablet 6  . ondansetron (ZOFRAN-ODT) 4 MG disintegrating tablet Take 1 tablet (4 mg total) by mouth every 8 (eight) hours as needed for nausea or vomiting. 50 tablet 2  . predniSONE (DELTASONE) 5 MG tablet Take 5 mg by mouth daily with breakfast.    . telmisartan (MICARDIS) 20 MG tablet TAKE 1 TABLET BY MOUTH  EVERY DAY (Patient taking differently: Take 20 mg by mouth at bedtime. ) 90 tablet 0  . triamcinolone cream (KENALOG) 0.1 % Apply 1 application topically 2 (two) times daily as needed (rash/irritation.).    [name] venlafaxine XR (EFFEXOR-XR) 75 MG 24 hr capsule Take 1 capsule (75 mg total) by mouth 2 (two) times daily. 180 capsule 3  . PARoxetine Mesylate 7.5 MG CAPS Take 7.5 mg by mouth daily. 90 capsule 3   No current facility-administered medications for this visit.     Objective:  BP 136/68   Pulse 60   Temp (!) 97.2 F (36.2 C) (Temporal)   Ht 5\' 6"  (1.676 m)   Wt 249 lb 9.6 oz (113.2 kg)   SpO2 94%   BMI 40.29 kg/m  Gen: NAD, resting comfortably HEENT: Mask not removed due to covid 19. TM normal. Bridge of nose normal. Eyelids normal.  Neck: no notable cervical lymphadenopathy  CV: RRR no murmurs rubs or gallops Lungs: CTAB no crackles, wheeze, rhonchi Abdomen: soft/nontender/nondistended/normal bowel sounds. No rebound or guarding. obese Ext: no edema Skin: warm, dry Neuro: grossly normal, moves all extremities     Assessment and Plan  # Surgical clearance  S:Patient is scheduled of surgery on left foot for Hammer toe on Thursday. She previously had left forefoot reconstruction December 2019 and then had another procedure July 2020 on the right foot.   I reviewed notes from Dr. Ellyn Hack her cardiologist on August 30, 2019-he did not recommend further cardiac evaluation prior to surgery.  She was considered low risk.  They also added Vascepa at that visit to help with her elevated triglycerides  Also reviewed pulmonology note from 10/02/2019-her pulmonary status has been largely stable other than COVID-19 episode she had an early December.  There was notes of involvement of the eyes with sarcoid for which she had a short-term burst of prednisone.  It did not think her pulmonary sarcoidosis was largely active-low  activity noted.  She was continued on 5 mg prednisone with plan to  taper to 3 mg by follow-up.  She had a visit with Dr. Loanne Drilling her endocrinologist in [place] that time she had an updated A1c preoperatively which was elevated Lab Results  Component Value Date   HGBA1C 9.2 (H) 10/18/2019    A/P: Patient is medically maximized for surgery from cardiac and pulmonary perspective-I think her surgical be low risk overall.  She has done well with 2 similar surgeries over the last 18 months and has not had a significant change in her health status since that time.  I told her my primary concern is inhibited wound healing with A1c of 9.2 on last check-it sounds like sugars have improved with adjustments from last month but I did ask her to still get endocrinology clearance before proceeding with surgery.   # Depression S: Patient is doing well with Wellbutrin.  She also takes Effexor twice daily.  She is on Paxil for hot flashes and has found this helpful Depression screen [facility] 2/9 09/13/2019  Decreased Interest 0  Down, Depressed, Hopeless 0  PHQ - 2 Score 0  Altered sleeping 0  Tired, decreased energy 1  Change in appetite 0  Feeling bad or failure about yourself  0  Trouble concentrating 0  Moving slowly or fidgety/restless 0  Suicidal thoughts 0  PHQ-9 Score 1  Difficult doing work/chores Not difficult at all  Some recent data might be hidden  A/P: Depression appears main formation-continue current medications  #hypertension/hypertensive heart disease with chronic diastolic CHF S: compliant with Lasix 40 mg twice a day, diltiazem extended release 120 mg, carvedilol, telmisartan.  She had a slight elevation several days ago  Fluid status appears stable-no increased edema or shortness of breath BP Readings from Last 3 Encounters:  10/22/19 136/68  10/18/19 (!) 140/47  10/04/19 130/66  A/P: Blood pressure trend appears to be improving.  We will continue current medication.  From CHF perspective appears stable-continue current medications    #hyperlipidemia S: compliant with atorvastatin 40 mg, TriCor 145 mg.  Despite his triglycerides remain high and she was started on Vascepa by cardiology recently Lab Results  Component Value Date   CHOL 160 07/04/2019   HDL 32.20 (L) 07/04/2019   LDLCALC 49 03/10/2017   LDLDIRECT 86.0 07/04/2019   TRIG 365.0 (H) 07/04/2019   CHOLHDL 5 07/04/2019   A/P: Hopefully improving-can update lipids at next visit   Recommended follow up: Scheduled for April follow-up Future Appointments  Date Time Provider [place]  11/01/2019  1:30 PM [facility] DEXA 1 [facility]  01/22/2020  9:40 AM Yong Channel, Brayton Mars, MD [facility]    Lab/Order associations:   ICD-10-CM   1. Recurrent major depressive disorder, in full remission ([place])  F33.42   2. Hypertensive heart disease with chronic diastolic congestive heart failure ([facility])  I11.0    I50.32   3. Hypertension associated with diabetes ([place])  E11.59    I10   4. Hyperlipidemia associated with type 2 diabetes mellitus ([place])  E11.69    E78.5     Meds ordered this encounter  Medications  . PARoxetine Mesylate 7.5 MG CAPS    Sig: Take 7.5 mg by mouth daily.    Dispense:  90 capsule    Refill:  3   Return precautions advised.  Garret Reddish, MD

## 2019-10-22 NOTE — Telephone Encounter (Signed)
Please advise 

## 2019-10-23 ENCOUNTER — Encounter: Payer: 59 | Admitting: Physical Therapy

## 2019-10-23 ENCOUNTER — Telehealth: Payer: Self-pay | Admitting: Podiatry

## 2019-10-23 ENCOUNTER — Other Ambulatory Visit: Payer: Self-pay

## 2019-10-23 NOTE — Telephone Encounter (Signed)
I'm having surgery on Thursday with Dr. Amalia Hailey. I went to see Dr. Yong Channel yesterday and he wanted me to go see my sugar doctor. I'm going to see that doctor tomorrow at 1:45 so they can get y'all the notes. I was just letting you know that. If you want, you can give me a call back at [phone]. Thank you. Bye.

## 2019-10-24 ENCOUNTER — Encounter: Payer: Self-pay | Admitting: Endocrinology

## 2019-10-24 ENCOUNTER — Ambulatory Visit (INDEPENDENT_AMBULATORY_CARE_PROVIDER_SITE_OTHER): Payer: 59 | Admitting: Endocrinology

## 2019-10-24 DIAGNOSIS — Z794 Long term (current) use of insulin: Secondary | ICD-10-CM

## 2019-10-24 DIAGNOSIS — E1129 Type 2 diabetes mellitus with other diabetic kidney complication: Secondary | ICD-10-CM

## 2019-10-24 MED ORDER — INSULIN LISPRO (1 UNIT DIAL) 100 UNIT/ML (KWIKPEN): 35.0000 [IU] | PEN_INJECTOR | Freq: Every day | SUBCUTANEOUS | Status: DC

## 2019-10-24 MED ORDER — TRULICITY 0.75 MG/0.5ML ~~LOC~~ SOAJ: 0.7500 mg | SUBCUTANEOUS | 11 refills | Status: DC

## 2019-10-24 MED ORDER — INSULIN LISPRO (1 UNIT DIAL) 100 UNIT/ML (KWIKPEN): 25.0000 [IU] | PEN_INJECTOR | Freq: Every day | SUBCUTANEOUS | Status: DC

## 2019-10-24 MED ORDER — LANTUS SOLOSTAR 100 UNIT/ML ~~LOC~~ SOPN: 50.0000 [IU] | PEN_INJECTOR | SUBCUTANEOUS | 2 refills | Status: DC

## 2019-10-24 NOTE — Telephone Encounter (Signed)
I called and spoke to [place] at [facility] to check on the status of a prior authorization request for surgery.  She said the case was still pending.  It's being reviewed.  I informed her that the patient is scheduled for surgery on tomorrow.  I asked to expedite the request.  She said we should hear something within 24 to 72 hours.  I told her we needed to hear something as soon as possible.  She said she was aware.

## 2019-10-24 NOTE — Progress Notes (Signed)
Subjective:    Patient ID: [name], female    DOB: 04/03/1961, 59 y.o.   MRN: [id number]  HPI Pt returns for f/u of diabetes mellitus: DM type: Insulin-requiring type 2 Dx'ed: [id number] Complications: polyneuropathy and mild CAD.  Therapy: insulin since 2006, and 2 oral meds.  GDM: never DKA: never Severe hypoglycemia: never Pancreatitis: never Pancreatic imaging: CT (2015) showed fatty atrophy.  Other: she takes 2 QD insulins, after poor results with multiple daily injections; she intermittently takes prednisone for sarcoidosis or AB.  Interval history: prednisone was tapered back to 5 mg qd, 2 weeks ago.  She takes insulin as rx'ed. Pt says since off steroids, cbg varies from 91-150.  pt states she feels well in general.  Past Medical History:  Diagnosis Date  . Abnormal SPEP 04/17/2014  . Acute left ankle pain 01/26/2017  . ANEMIA-UNSPECIFIED 09/18/2009  . Anxiety   . Arthritis   . CHF (congestive heart failure) ([place])   . Chronic diastolic heart failure, NYHA class 2 (HCC)    Normal LVEDP by May 2018  . COPD (chronic obstructive pulmonary disease) ([place])   . Depression   . DIABETES MELLITUS, TYPE II 08/21/2006  . Diabetic osteomyelitis ([place]) 05/29/2015  . Fracture of 5th metatarsal    non union  . GERD [id number]   Had fundoplication  . GOUT 08/20/2010  . Hammer toe of right foot    2-5th toes  . Hx of umbilical hernia repair   . HYPERLIPIDEMIA 08/21/2006  . HYPERTENSION 08/21/2006  . Infection of wound due to methicillin resistant Staphylococcus aureus (MRSA)   . Internal hemorrhoids   . Multiple allergies 10/14/2016  . OBESITY 06/04/2009  . Onychomycosis 10/27/2015  . Osteomyelitis of left foot ([place]) 05/29/2015  . Pulmonary sarcoidosis ([place])    Followed locally by pulmonology, but also by Dr. Casper Harrison at [facility]  . Right knee pain 01/26/2017  . Vocal cord dysfunction   . Wears partial dentures     Past Surgical History:  Procedure Laterality Date    . ABDOMINAL HYSTERECTOMY    . APPENDECTOMY    . BLADDER SUSPENSION  11/11/2011   Procedure: TRANSVAGINAL TAPE (TVT) PROCEDURE;  Surgeon: Olga Millers, MD;  Location: [facility];  Service: Gynecology;  Laterality: N/A;  . CAROTIDS  02/18/11   CAROTID DUPLEX; VERTEBRALS ARE PATENT WITH ANTEGRADE FLOW. ICA/CCA RATIO 1.61 ON RIGHT AND 0.75 ON LEFT  . CHOLECYSTECTOMY  1984  . CYSTOSCOPY  11/11/2011   Procedure: CYSTOSCOPY;  Surgeon: Olga Millers, MD;  Location: [facility];  Service: Gynecology;  Laterality: N/A;  . EXTUBATION (ENDOTRACHEAL) IN OR N/A 09/23/2017   Procedure: EXTUBATION (ENDOTRACHEAL) IN OR;  Surgeon: Helayne Seminole, MD;  Location: [facility];  Service: ENT;  Laterality: N/A;  . FIBEROPTIC LARYNGOSCOPY AND TRACHEOSCOPY N/A 09/23/2017   Procedure: FLEXIBLE FIBEROPTIC LARYNGOSCOPY;  Surgeon: Helayne Seminole, MD;  Location: [facility];  Service: ENT;  Laterality: N/A;  . FRACTURE SURGERY     foot  . HALLUX FUSION Left 10/02/2018   Procedure: HALLUX ARTHRODESIS;  Surgeon: Edrick Kins, DPM;  Location: [facility];  Service: Podiatry;  Laterality: Left;  . HAMMER TOE SURGERY Left 10/02/2018   Procedure: HAMMER TOE CORRECTION 2ND 3RD 4RD FIFTH TOE;  Surgeon: Edrick Kins, DPM;  Location: [facility];  Service: Podiatry;  Laterality: Left;  . HAMMER TOE SURGERY Right 04/12/2019   Procedure: HAMMER TOE CORRECTION, 2ND, 3RD, 4TH AND 5TH TOES OF RIGHT FOOT;  Surgeon: Edrick Kins, DPM;  Location: [facility];  Service: Podiatry;  Laterality: Right;  . HERNIA REPAIR    . I & D EXTREMITY Left 06/27/2015   Procedure: Partial Excision Left Calcaneus, Place Antibiotic Beads, and Wound VAC;  Surgeon: Newt Minion, MD;  Location: [facility];  Service: Orthopedics;  Laterality: Left;  . JOINT REPLACEMENT    . KNEE ARTHROSCOPY     right  . LEFT AND RIGHT HEART CATHETERIZATION WITH CORONARY ANGIOGRAM N/A 04/23/2013   Procedure: LEFT AND RIGHT HEART CATHETERIZATION WITH CORONARY ANGIOGRAM;  Surgeon: Leonie Man,  MD;  Location: [facility];  Service: Cardiovascular;  Laterality: N/A;  . LEFT HEART CATH AND CORONARY ANGIOGRAPHY N/A 03/11/2017   Procedure: Left Heart Cath and Coronary Angiography;  Surgeon: Sherren Mocha, MD;  Location: [facility]; angiographically minimal CAD in the LAD otherwise normal.;  Normal LVEDP.  FALSE POSITIVE MYOVIEW  . LEFT HEART CATH AND CORONARY ANGIOGRAPHY  07/20/2010   LVEF 50-55% WITH VERY MILD GLOBAL HYPOKINESIA; ESSENTIALLY NORMAL CORONARY ARTERIES; NORMAL LV FUNCTION  . METATARSAL OSTEOTOMY WITH OPEN REDUCTION INTERNAL FIXATION (ORIF) METATARSAL WITH FUSION Left 04/09/2014   Procedure: LEFT FOOT FRACTURE OPEN TREATMENT METATARSAL INCLUDES INTERNAL FIXATION EACH;  Surgeon: Lorn Junes, MD;  Location: [facility];  Service: Orthopedics;  Laterality: Left;  . NISSEN FUNDOPLICATION  [id number]  . NM MYOVIEW LTD  03/09/2013   Lexiscan --> EF 50%; NORMAL MYOCARDIAL PERFUSION STUDY - breast attenuation  . NM MYOVIEW LTD  03/10/2017   : Moderate size "stress-induced "perfusion defect at the apex as well as "ill-defined stress-induced perfusion defect in the lateral wall.  EF 55%.  INTERMEDIATE risk. -->  FALSE POSITIVE  . Right and left CARDIAC CATHETERIZATION  04/23/2013   Angiographic normal coronaries; LVEDP 20 mmHg, PCWP 12-14 mmHg, RAP 12 mmHg.; Fick CO/CI 4.9/2.2  . SHOULDER ARTHROSCOPY Left 03/14/2019   Procedure: LEFT SHOULDER ARTHROSCOPY, DEBRIDEMENT, AND DECOMPRESSION;  Surgeon: Newt Minion, MD;  Location: [facility];  Service: Orthopedics;  Laterality: Left;  . TOTAL KNEE ARTHROPLASTY Right 06/29/2017   Procedure: RIGHT TOTAL KNEE ARTHROPLASTY;  Surgeon: Newt Minion, MD;  Location: [facility];  Service: Orthopedics;  Laterality: Right;  . TOTAL KNEE ARTHROPLASTY Left 12/07/2017  . TOTAL KNEE ARTHROPLASTY Left 12/07/2017   Procedure: LEFT TOTAL KNEE ARTHROPLASTY;  Surgeon: Newt Minion, MD;  Location: [facility];  Service: Orthopedics;  Laterality: Left;  .  TRACHEOSTOMY TUBE PLACEMENT N/A 09/20/2017   Procedure: AWAKE INTUBATION WITH ANESTHESIA WITH VIDEO ASSISTANCE;  Surgeon: Helayne Seminole, MD;  Location: [facility];  Service: ENT;  Laterality: N/A;  . TRANSTHORACIC ECHOCARDIOGRAM  08/2014   Normal LV size and function.  Mild LVH.  EF 55-60%.  Normal regional wall motion.  GR 1 DD.  Normal RV size and function .  [name] TUBAL LIGATION     with reversal in 1994  . VENTRAL HERNIA REPAIR      Social History   Socioeconomic History  . Marital status: Married    Spouse name: LYNNELLE ZESATI  . Number of children: 2  . Years of education: 33  . Highest education level: Not on file  Occupational History  . Occupation: DISABLED  Tobacco Use  . Smoking status: Never Smoker  . Smokeless tobacco: Never Used  Substance and Sexual Activity  . Alcohol use: No    Alcohol/week: 0.0 standard drinks  . Drug use: No  . Sexual activity: Yes  Birth control/protection: Surgical  Other Topics Concern  . Not on file  Social History Narrative   Married 1994. 2 sons who both live close and 1 grandson.       Disability due to sarcoidosis. 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 Difficulty of Paying Living Expenses: Not on file  Food Insecurity:   . Worried About Charity fundraiser in the Last Year: Not on file  . Ran Out of Food in the Last Year: Not on file  Transportation Needs:   . Lack of Transportation (Medical): Not on file  . Lack of Transportation (Non-Medical): Not on file  Physical Activity:   . Days of Exercise per Week: Not on file  . Minutes of Exercise per Session: Not on file  Stress:   . Feeling of Stress : Not on file  Social Connections:   . Frequency of Communication with Friends and Family: Not on file  . Frequency of  Social Gatherings with Friends and Family: Not on file  . Attends Religious Services: Not on file  . Active Member of Clubs or Organizations: Not on file  . Attends Archivist Meetings: Not on file  . Marital Status: Not on file  Intimate Partner Violence:   . Fear of Current or Ex-Partner: Not on file  . Emotionally Abused: Not on file  . Physically Abused: Not on file  . Sexually Abused: Not on file    Current Outpatient Medications on File Prior to Visit  Medication Sig Dispense Refill  . albuterol (PROVENTIL HFA;VENTOLIN HFA) 108 (90 Base) MCG/ACT inhaler Inhale 1-2 puffs into the lungs every 6 (six) hours as needed for wheezing or shortness of breath. 8 g 5  . allopurinol (ZYLOPRIM) 100 MG tablet Take 100 mg by mouth every evening.     [name] aspirin EC 81 MG tablet Take 81 mg by mouth daily.     [name] atorvastatin (LIPITOR) 40 MG tablet TAKE 1 TABLET BY MOUTH EVERY DAY (Patient taking differently: Take 40 mg by mouth at bedtime. ) 90 tablet 3  . BREO ELLIPTA 200-25 MCG/INH AEPB TAKE 1 PUFF BY MOUTH EVERY DAY (Patient taking differently: Inhale 1 puff into the lungs daily. ) 60 each 5  . buPROPion (WELLBUTRIN XL) 300 MG 24 hr tablet TAKE 1 TABLET BY MOUTH EVERY DAY (Patient taking differently: Take 300 mg by mouth daily. ) 90 tablet 3  . carvedilol (COREG) 25 MG tablet TAKE 1 TABLET (25 MG TOTAL) BY MOUTH 2 (TWO) TIMES DAILY WITH A MEAL. 180 tablet 3  . chlorpheniramine-HYDROcodone (TUSSIONEX PENNKINETIC ER) 10-8 MG/5ML SUER Take 5 mLs by mouth 2 (two) times daily. (Patient taking differently: Take 5 mLs by mouth every 12 (twelve) hours as needed for cough. ) 140 mL 0  . cholecalciferol (VITAMIN D) 25 MCG (1000 UT) tablet Take 1,000 Units by mouth 2 (two) times a day.    [name] dexlansoprazole (DEXILANT) 60 MG capsule Take 1 capsule (60 mg total) by mouth daily. 30 capsule 5  . diclofenac Sodium (VOLTAREN) 1 % GEL Apply 1 application topically 4 (four) times daily as needed (pain.).    [name]  diltiazem (CARDIZEM CD) 120 MG 24 hr capsule TAKE 1 CAPSULE BY MOUTH EVERY DAY (Patient taking differently: Take  120 mg by mouth every evening. ) 90 capsule 0  . famotidine (PEPCID) 20 MG tablet Take 1 tablet (20 mg total) by mouth at bedtime. 30 tablet 3  . fenofibrate 160 MG tablet TAKE 1 TABLET BY MOUTH EVERY DAY (Patient taking differently: Take 160 mg by mouth at bedtime. ) 90 tablet 1  . fluticasone (FLONASE) 50 MCG/ACT nasal spray PLACE 2 SPRAYS INTO BOTH NOSTRILS DAILY AS NEEDED FOR ALLERGIES 48 mL 1  . furosemide (LASIX) 40 MG tablet TAKE 1 TABLET BY MOUTH TWICE A DAY (Patient taking differently: Take 40 mg by mouth 2 (two) times daily. ) 180 tablet 3  . gabapentin (NEURONTIN) 100 MG capsule TAKE 1 CAPSULE BY MOUTH THREE TIMES A DAY (Patient taking differently: Take 100-200 mg by mouth See admin instructions. Take 1 capsule (100 mg) by mouth in the morning & 2 capsules (200 mg) by mouth at night.) 270 capsule 3  . glucose blood (CONTOUR NEXT TEST) test strip 1 each by Other route 4 (four) times daily. And lancets 4/day 400 each 3  . icosapent Ethyl (VASCEPA) 1 g capsule Take 2 capsules (2 g total) by mouth 2 (two) times daily. 120 capsule 11  . Insulin Pen Needle 33G X 4 MM MISC 1 each by Does not apply route 4 (four) times daily. 200 each 11  . isosorbide mononitrate (IMDUR) 30 MG 24 hr tablet Take 1 tablet (30 mg total) by mouth at bedtime. 90 tablet 1  . KLOR-CON M20 20 MEQ tablet TAKE 1 & 1/2 TABLETS BY MOUTH TWICE A DAY (Patient taking differently: Take 30 mEq by mouth 2 (two) times a day. ) 270 tablet 2  . lactulose (CHRONULAC) 10 GM/15ML solution Take 10 g by mouth daily as needed (constipation).     [name] linaclotide (LINZESS) 290 MCG CAPS capsule Take 1 capsule (290 mcg total) by mouth daily before breakfast. (Patient taking differently: Take 290 mcg by mouth daily as needed (constipation). ) 30 capsule 11  . LORazepam (ATIVAN) 0.5 MG tablet Take 0.5 mg by mouth every 12 (twelve) hours as  needed for anxiety.    . metFORMIN (GLUCOPHAGE) 1000 MG tablet TAKE 1 TABLET BY MOUTH 2 TIMES DAILY WITH A MEAL. MUST CALL TO SCHEDULE AN APPT. (Patient taking differently: Take 1,000 mg by mouth 2 (two) times daily. ) 180 tablet 1  . Microlet Lancets MISC 1 each by Does not apply route 2 (two) times daily. E11.9 100 each 2  . nitroGLYCERIN (NITROSTAT) 0.4 MG SL tablet Place 1 tablet (0.4 mg total) under the tongue every 5 (five) minutes as needed for chest pain. Reported on 01/05/2016 25 tablet 6  . ondansetron (ZOFRAN-ODT) 4 MG disintegrating tablet Take 1 tablet (4 mg total) by mouth every 8 (eight) hours as needed for nausea or vomiting. 50 tablet 2  . PARoxetine Mesylate 7.5 MG CAPS Take 7.5 mg by mouth daily. 90 capsule 3  . predniSONE (DELTASONE) 5 MG tablet Take 5 mg by mouth daily with breakfast.    . telmisartan (MICARDIS) 20 MG tablet TAKE 1 TABLET BY MOUTH EVERY DAY (Patient taking differently: Take 20 mg by mouth at bedtime. ) 90 tablet 0  . triamcinolone cream (KENALOG) 0.1 % Apply 1 application topically 2 (two) times daily as needed (rash/irritation.).    [name] venlafaxine XR (EFFEXOR-XR) 75 MG 24 hr capsule Take 1 capsule (75 mg total) by mouth 2 (two) times daily. 180 capsule 3  . [DISCONTINUED] mupirocin nasal ointment (BACTROBAN) 2 %  Place 1 application into the nose 2 (two) times daily. Use one-half of tube in each nostril twice daily for five (5) days. After application, press sides of nose together and gently massage. 10 g 0   No current facility-administered medications on file prior to visit.    Allergies  Allergen Reactions  . Methotrexate Other (See Comments)    Peri-oral and buccal lesions  . Vancomycin Other (See Comments)    DOSE RELATED NEPHROTOXICITY  . Lisinopril Cough  . Chlorhexidine Itching  . Clindamycin/Lincomycin Nausea And Vomiting and Rash  . Doxycycline Rash  . Teflaro [Ceftaroline] Rash and Other (See Comments)    Tolerates ceftriaxone     Family  History  Problem Relation Age of Onset  . Diabetes Father   . Heart attack Father 75  . Coronary artery disease Father   . Heart failure Father   . Throat cancer Father   . COPD Mother   . Emphysema Mother   . Asthma Mother   . Heart failure Mother   . Breast cancer Mother   . Heart attack Maternal Grandfather   . Sarcoidosis Maternal Uncle   . Lung cancer Brother   . Diabetes Brother   . Colon cancer Neg Hx   . Rectal cancer Neg Hx     BP (!) 148/80 (BP Location: Left Arm, Patient Position: Sitting, Cuff Size: Large)   Pulse 84   Ht 5\' 6"  (1.676 m)   Wt 247 lb 12.8 oz (112.4 kg)   SpO2 98%   BMI 40.00 kg/m    Review of Systems She denies hypoglycemia    Objective:   Physical Exam VITAL SIGNS:  See vs page GENERAL: no distress Pulses: dorsalis pedis intact bilat.   MSK: no deformity of the feet CV: trace bilat leg edema.   Skin:  no ulcer on the feet.  normal color and temp on the feet. Neuro: sensation is intact to touch on the feet. Ext: there is bilateral onychomycosis of the toenails.  Lab Results  Component Value Date   HGBA1C 9.2 (H) 10/18/2019   Lab Results  Component Value Date   CREATININE 0.97 10/18/2019   BUN 22 (H) 10/18/2019   NA 142 10/18/2019   K 4.4 10/18/2019   CL 102 10/18/2019   CO2 28 10/18/2019       Assessment & Plan:  Sarcoidosis: prednisone is affecting A1c. Insulin-requiring type 2 DM: well-controlled per cbg's Hammer toes: she needs clearance.   Patient Instructions  Please reduce the insulins to the numbers listed below, and: I have sent a prescription to your pharmacy, to start Trulicity. Your blood pressure is high today.  Please see your primary care provider soon, to have it rechecked your surgical risk is low and outweighed by the potential benefit of the surgery.  You are therefore medically cleared (from the standpoint of the diabetes) check your blood sugar 4 times a day: before the 3 meals, and at bedtime.  also  check if you have symptoms of your blood sugar being too high or too low.  please keep a record of the readings and bring it to your next appointment here (or you can bring the meter itself).  You can write it on any piece of paper.  please call us sooner if your blood sugar goes below 70, or if you have a lot of readings over 200.  Tomorrow only, take just 1/2 of insulin shot. Please come back for a follow-up appointment in 2 months.

## 2019-10-24 NOTE — Patient Instructions (Addendum)
Please reduce the insulins to the numbers listed below, and: I have sent a prescription to your pharmacy, to start Trulicity. Your blood pressure is high today.  Please see your primary care provider soon, to have it rechecked your surgical risk is low and outweighed by the potential benefit of the surgery.  You are therefore medically cleared (from the standpoint of the diabetes) check your blood sugar 4 times a day: before the 3 meals, and at bedtime.  also check if you have symptoms of your blood sugar being too high or too low.  please keep a record of the readings and bring it to your next appointment here (or you can bring the meter itself).  You can write it on any piece of paper.  please call us sooner if your blood sugar goes below 70, or if you have a lot of readings over 200.  Tomorrow only, take just 1/2 of insulin shot.   Please come back for a follow-up appointment in 2 months.

## 2019-10-25 ENCOUNTER — Encounter (HOSPITAL_COMMUNITY)
Admission: RE | Disposition: A | Discharge status: Home / Self Care | Payer: Self-pay | Source: Home / Self Care | Attending: Podiatry

## 2019-10-25 ENCOUNTER — Encounter (HOSPITAL_COMMUNITY): Payer: Self-pay | Admitting: Podiatry

## 2019-10-25 ENCOUNTER — Ambulatory Visit (HOSPITAL_COMMUNITY): Payer: 59 | Admitting: Physician Assistant

## 2019-10-25 ENCOUNTER — Encounter: Payer: 59 | Admitting: Physical Therapy

## 2019-10-25 ENCOUNTER — Ambulatory Visit (HOSPITAL_COMMUNITY)
Admission: RE | Admit: 2019-10-25 | Discharge: 2019-10-25 | Disposition: A | Discharge status: Home / Self Care | Payer: 59 | Attending: Podiatry | Admitting: Podiatry

## 2019-10-25 ENCOUNTER — Other Ambulatory Visit: Payer: Self-pay

## 2019-10-25 DIAGNOSIS — I110 Hypertensive heart disease with heart failure: Secondary | ICD-10-CM | POA: Diagnosis not present

## 2019-10-25 DIAGNOSIS — F3342 Major depressive disorder, recurrent, in full remission: Secondary | ICD-10-CM | POA: Diagnosis not present

## 2019-10-25 DIAGNOSIS — D860 Sarcoidosis of lung: Secondary | ICD-10-CM | POA: Insufficient documentation

## 2019-10-25 DIAGNOSIS — J449 Chronic obstructive pulmonary disease, unspecified: Secondary | ICD-10-CM | POA: Insufficient documentation

## 2019-10-25 DIAGNOSIS — Z794 Long term (current) use of insulin: Secondary | ICD-10-CM | POA: Insufficient documentation

## 2019-10-25 DIAGNOSIS — M109 Gout, unspecified: Secondary | ICD-10-CM | POA: Insufficient documentation

## 2019-10-25 DIAGNOSIS — M2042 Other hammer toe(s) (acquired), left foot: Secondary | ICD-10-CM | POA: Insufficient documentation

## 2019-10-25 DIAGNOSIS — K219 Gastro-esophageal reflux disease without esophagitis: Secondary | ICD-10-CM | POA: Insufficient documentation

## 2019-10-25 DIAGNOSIS — Z79899 Other long term (current) drug therapy: Secondary | ICD-10-CM | POA: Insufficient documentation

## 2019-10-25 DIAGNOSIS — I5032 Chronic diastolic (congestive) heart failure: Secondary | ICD-10-CM | POA: Insufficient documentation

## 2019-10-25 DIAGNOSIS — E119 Type 2 diabetes mellitus without complications: Secondary | ICD-10-CM | POA: Diagnosis not present

## 2019-10-25 DIAGNOSIS — F419 Anxiety disorder, unspecified: Secondary | ICD-10-CM | POA: Diagnosis not present

## 2019-10-25 DIAGNOSIS — G4730 Sleep apnea, unspecified: Secondary | ICD-10-CM | POA: Insufficient documentation

## 2019-10-25 DIAGNOSIS — I10 Essential (primary) hypertension: Secondary | ICD-10-CM | POA: Diagnosis not present

## 2019-10-25 DIAGNOSIS — E1159 Type 2 diabetes mellitus with other circulatory complications: Secondary | ICD-10-CM | POA: Diagnosis not present

## 2019-10-25 DIAGNOSIS — I509 Heart failure, unspecified: Secondary | ICD-10-CM | POA: Diagnosis not present

## 2019-10-25 DIAGNOSIS — E1169 Type 2 diabetes mellitus with other specified complication: Secondary | ICD-10-CM | POA: Insufficient documentation

## 2019-10-25 DIAGNOSIS — Z7982 Long term (current) use of aspirin: Secondary | ICD-10-CM | POA: Insufficient documentation

## 2019-10-25 DIAGNOSIS — Z6841 Body Mass Index (BMI) 40.0 and over, adult: Secondary | ICD-10-CM | POA: Insufficient documentation

## 2019-10-25 DIAGNOSIS — E785 Hyperlipidemia, unspecified: Secondary | ICD-10-CM | POA: Insufficient documentation

## 2019-10-25 DIAGNOSIS — Z8616 Personal history of COVID-19: Secondary | ICD-10-CM | POA: Diagnosis not present

## 2019-10-25 DIAGNOSIS — Z7951 Long term (current) use of inhaled steroids: Secondary | ICD-10-CM | POA: Diagnosis not present

## 2019-10-25 HISTORY — PX: HAMMER TOE SURGERY: SHX385

## 2019-10-25 LAB — GLUCOSE, CAPILLARY
Glucose-Capillary: 166 mg/dL — ABNORMAL HIGH (ref 70–99)
Glucose-Capillary: 160 mg/dL — ABNORMAL HIGH (ref 70–99)
Glucose-Capillary: 137 mg/dL — ABNORMAL HIGH (ref 70–99)

## 2019-10-25 SURGERY — CORRECTION, HAMMER TOE
Anesthesia: General | Site: Toe | Laterality: Left

## 2019-10-25 MED ORDER — MIDAZOLAM HCL 2 MG/2ML IJ SOLN
INTRAMUSCULAR | Status: DC | PRN
  Administered 2019-10-25: 2 mg via INTRAVENOUS

## 2019-10-25 MED ORDER — PHENYLEPHRINE 40 MCG/ML (10ML) SYRINGE FOR IV PUSH (FOR BLOOD PRESSURE SUPPORT)
PREFILLED_SYRINGE | INTRAVENOUS | Status: AC
  Filled 2019-10-25: qty 10

## 2019-10-25 MED ORDER — PROPOFOL 10 MG/ML IV BOLUS
INTRAVENOUS | Status: AC
  Filled 2019-10-25: qty 20

## 2019-10-25 MED ORDER — LACTATED RINGERS IV SOLN: INTRAVENOUS | Status: DC

## 2019-10-25 MED ORDER — ONDANSETRON HCL 4 MG/2ML IJ SOLN
INTRAMUSCULAR | Status: DC | PRN
  Administered 2019-10-25: 4 mg via INTRAVENOUS

## 2019-10-25 MED ORDER — FENTANYL CITRATE (PF) 250 MCG/5ML IJ SOLN
INTRAMUSCULAR | Status: AC
  Filled 2019-10-25: qty 5

## 2019-10-25 MED ORDER — 0.9 % SODIUM CHLORIDE (POUR BTL) OPTIME
TOPICAL | Status: DC | PRN
  Administered 2019-10-25: 1000 mL

## 2019-10-25 MED ORDER — POVIDONE-IODINE 7.5 % EX SOLN
Freq: Once | CUTANEOUS | Status: DC
  Filled 2019-10-25: qty 118

## 2019-10-25 MED ORDER — ROCURONIUM BROMIDE 10 MG/ML (PF) SYRINGE
PREFILLED_SYRINGE | INTRAVENOUS | Status: AC
  Filled 2019-10-25: qty 30

## 2019-10-25 MED ORDER — DEXAMETHASONE SODIUM PHOSPHATE 10 MG/ML IJ SOLN
INTRAMUSCULAR | Status: AC
  Filled 2019-10-25: qty 1

## 2019-10-25 MED ORDER — PROMETHAZINE HCL 25 MG/ML IJ SOLN: 6.2500 mg | INTRAMUSCULAR | Status: DC | PRN

## 2019-10-25 MED ORDER — ACETAMINOPHEN 10 MG/ML IV SOLN: 1000.0000 mg | Freq: Once | INTRAVENOUS | Status: DC | PRN

## 2019-10-25 MED ORDER — LIDOCAINE HCL (CARDIAC) PF 100 MG/5ML IV SOSY
PREFILLED_SYRINGE | INTRAVENOUS | Status: DC | PRN
  Administered 2019-10-25: 60 mg via INTRAVENOUS

## 2019-10-25 MED ORDER — ONDANSETRON HCL 4 MG/2ML IJ SOLN
INTRAMUSCULAR | Status: AC
  Filled 2019-10-25: qty 2

## 2019-10-25 MED ORDER — HYDROMORPHONE HCL 1 MG/ML IJ SOLN: 0.2500 mg | INTRAMUSCULAR | Status: DC | PRN

## 2019-10-25 MED ORDER — LIDOCAINE HCL 2 % IJ SOLN
INTRAMUSCULAR | Status: AC
  Filled 2019-10-25: qty 20

## 2019-10-25 MED ORDER — BUPIVACAINE HCL (PF) 0.5 % IJ SOLN
INTRAMUSCULAR | Status: AC
  Filled 2019-10-25: qty 30

## 2019-10-25 MED ORDER — FENTANYL CITRATE (PF) 100 MCG/2ML IJ SOLN
INTRAMUSCULAR | Status: DC | PRN
  Administered 2019-10-25: 50 ug via INTRAVENOUS

## 2019-10-25 MED ORDER — CEFAZOLIN SODIUM-DEXTROSE 2-4 GM/100ML-% IV SOLN
2.0000 g | INTRAVENOUS | Status: AC
  Administered 2019-10-25: 2 g via INTRAVENOUS

## 2019-10-25 MED ORDER — DEXAMETHASONE SODIUM PHOSPHATE 10 MG/ML IJ SOLN
INTRAMUSCULAR | Status: AC
  Filled 2019-10-25: qty 2

## 2019-10-25 MED ORDER — PROPOFOL 10 MG/ML IV BOLUS
INTRAVENOUS | Status: DC | PRN
  Administered 2019-10-25: 200 mg via INTRAVENOUS

## 2019-10-25 MED ORDER — OXYCODONE HCL 5 MG/5ML PO SOLN: 5.0000 mg | Freq: Once | ORAL | Status: DC | PRN

## 2019-10-25 MED ORDER — LIDOCAINE 2% (20 MG/ML) 5 ML SYRINGE
INTRAMUSCULAR | Status: AC
  Filled 2019-10-25: qty 15

## 2019-10-25 MED ORDER — CEFAZOLIN SODIUM-DEXTROSE 2-4 GM/100ML-% IV SOLN
INTRAVENOUS | Status: AC
  Filled 2019-10-25: qty 100

## 2019-10-25 MED ORDER — EPHEDRINE 5 MG/ML INJ
INTRAVENOUS | Status: AC
  Filled 2019-10-25: qty 20

## 2019-10-25 MED ORDER — OXYCODONE-ACETAMINOPHEN 5-325 MG PO TABS: 1.0000 | ORAL_TABLET | Freq: Four times a day (QID) | ORAL | 0 refills | Status: DC | PRN

## 2019-10-25 MED ORDER — DEXAMETHASONE SODIUM PHOSPHATE 10 MG/ML IJ SOLN
INTRAMUSCULAR | Status: DC | PRN
  Administered 2019-10-25: 4 mg via INTRAVENOUS

## 2019-10-25 MED ORDER — KETOROLAC TROMETHAMINE 30 MG/ML IJ SOLN: 30.0000 mg | Freq: Once | INTRAMUSCULAR | Status: DC

## 2019-10-25 MED ORDER — MIDAZOLAM HCL 2 MG/2ML IJ SOLN
INTRAMUSCULAR | Status: AC
  Filled 2019-10-25: qty 2

## 2019-10-25 MED ORDER — OXYCODONE HCL 5 MG PO TABS: 5.0000 mg | ORAL_TABLET | Freq: Once | ORAL | Status: DC | PRN

## 2019-10-25 SURGICAL SUPPLY — 47 items
BLADE MINI RND TIP GREEN BEAV (BLADE) ×2 IMPLANT
BLADE OSCILLATING/SAGITTAL (BLADE) ×2
BLADE SURG 15 STRL LF DISP TIS (BLADE) ×2 IMPLANT
BLADE SURG 15 STRL SS (BLADE) ×4
BLADE SW THK.38XMED NAR THN (BLADE) ×1 IMPLANT
BNDG CMPR 9X4 STRL LF SNTH (GAUZE/BANDAGES/DRESSINGS) ×1
BNDG ELASTIC 4X5.8 VLCR STR LF (GAUZE/BANDAGES/DRESSINGS) ×2 IMPLANT
BNDG ESMARK 4X9 LF (GAUZE/BANDAGES/DRESSINGS) ×2 IMPLANT
BNDG GAUZE ELAST 4 BULKY (GAUZE/BANDAGES/DRESSINGS) ×2 IMPLANT
COVER WAND RF STERILE (DRAPES) ×2 IMPLANT
CUFF TOURN SGL QUICK 18X4 (TOURNIQUET CUFF) IMPLANT
CUFF TOURN SGL QUICK 24 (TOURNIQUET CUFF)
CUFF TRNQT CYL 24X4X16.5-23 (TOURNIQUET CUFF) IMPLANT
DRAPE EXTREMITY T 121X128X90 (DISPOSABLE) ×2 IMPLANT
DRAPE OEC MINIVIEW 54X84 (DRAPES) ×2 IMPLANT
DRAPE U-SHAPE 47X51 STRL (DRAPES) ×2 IMPLANT
DRSG XEROFORM 1X8 (GAUZE/BANDAGES/DRESSINGS) ×2 IMPLANT
ELECT REM PT RETURN 9FT ADLT (ELECTROSURGICAL) ×2
ELECTRODE REM PT RTRN 9FT ADLT (ELECTROSURGICAL) ×1 IMPLANT
GAUZE SPONGE 4X4 12PLY STRL (GAUZE/BANDAGES/DRESSINGS) ×2 IMPLANT
GAUZE XEROFORM 1X8 LF (GAUZE/BANDAGES/DRESSINGS) ×2 IMPLANT
GLOVE BIO SURGEON STRL SZ8 (GLOVE) IMPLANT
GLOVE BIOGEL PI IND STRL 8 (GLOVE) IMPLANT
GLOVE BIOGEL PI INDICATOR 8 (GLOVE)
GOWN STRL REUS W/ TWL LRG LVL3 (GOWN DISPOSABLE) ×1 IMPLANT
GOWN STRL REUS W/ TWL XL LVL3 (GOWN DISPOSABLE) ×1 IMPLANT
GOWN STRL REUS W/TWL LRG LVL3 (GOWN DISPOSABLE) ×2
GOWN STRL REUS W/TWL XL LVL3 (GOWN DISPOSABLE) ×2
K.Wire 1.1 ×2 IMPLANT
KIT BASIN OR (CUSTOM PROCEDURE TRAY) ×2 IMPLANT
NEEDLE 18GX1X1/2 (RX/OR ONLY) (NEEDLE) IMPLANT
NEEDLE HYPO 25X1 1.5 SAFETY (NEEDLE) ×2 IMPLANT
NS IRRIG 1000ML POUR BTL (IV SOLUTION) IMPLANT
PACK ORTHO EXTREMITY (CUSTOM PROCEDURE TRAY) ×2 IMPLANT
PADDING CAST ABS 4INX4YD NS (CAST SUPPLIES) ×2
PADDING CAST ABS COTTON 4X4 ST (CAST SUPPLIES) ×2 IMPLANT
SCREW CANN 2.5X42 (Screw) ×2 IMPLANT
SCREW CANN THRD ST 2.5X32 (Screw) ×2 IMPLANT
SCREW CANN THRD ST 2.5X40 (Screw) ×2 IMPLANT
SOL PREP POV-IOD 4OZ 10% (MISCELLANEOUS) ×4 IMPLANT
SPLINT FIBERGLASS 4X30 (CAST SUPPLIES) ×2 IMPLANT
STAPLER VISISTAT (STAPLE) IMPLANT
SUT PROLENE 4 0 PS 2 18 (SUTURE) ×2 IMPLANT
SUT VICRYL 4-0 PS2 18IN ABS (SUTURE) IMPLANT
SYR BULB 3OZ (MISCELLANEOUS) ×2 IMPLANT
SYR CONTROL 10ML LL (SYRINGE) IMPLANT
UNDERPAD 30X30 (UNDERPADS AND DIAPERS) ×2 IMPLANT

## 2019-10-25 NOTE — Anesthesia Postprocedure Evaluation (Signed)
Anesthesia Post Note  Patient: [name]  Procedure(s) Performed: HAMMER TOE REPAIR SECOND, THIRD, FOUTH (Left Toe)     Patient location during evaluation: PACU Anesthesia Type: General Level of consciousness: awake and alert Pain management: pain level controlled Vital Signs Assessment: post-procedure vital signs reviewed and stable Respiratory status: spontaneous breathing, nonlabored ventilation, respiratory function stable and patient connected to nasal cannula oxygen Cardiovascular status: blood pressure returned to baseline and stable Postop Assessment: no apparent nausea or vomiting Anesthetic complications: no    Last Vitals:  Vitals:   10/25/19 1753 10/25/19 1806  BP: 137/62 137/70  Pulse: 77 75  Resp: 17 15  Temp: 36.8 C   SpO2: 97% 95%    Last Pain:  Vitals:   10/25/19 1753  TempSrc:   PainSc: 0-No pain                 [name] P Jacquelynn Friend

## 2019-10-25 NOTE — Anesthesia Procedure Notes (Signed)
Procedure Name: LMA Insertion Date/Time: 10/25/2019 4:39 PM Performed by: Inda Coke, CRNA Pre-anesthesia Checklist: Patient identified, Emergency Drugs available, Suction available and Patient being monitored Patient Re-evaluated:Patient Re-evaluated prior to induction Oxygen Delivery Method: Circle System Utilized Preoxygenation: Pre-oxygenation with 100% oxygen Induction Type: IV induction Ventilation: Mask ventilation without difficulty LMA: LMA inserted LMA Size: 4.0 Number of attempts: 1 Airway Equipment and Method: Bite block Placement Confirmation: positive ETCO2 Tube secured with: Tape Dental Injury: Teeth and Oropharynx as per pre-operative assessment

## 2019-10-25 NOTE — Telephone Encounter (Signed)
I called [facility] and spoke to [place].  He said the surgery had been authorized.  The authorization number is the same, [id number].

## 2019-10-25 NOTE — Telephone Encounter (Signed)
Patient is scheduled for appointment on 12/26/19

## 2019-10-25 NOTE — Transfer of Care (Signed)
Immediate Anesthesia Transfer of Care Note  Patient: [name]  Procedure(s) Performed: HAMMER TOE REPAIR SECOND, THIRD, FOUTH (Left Toe)  Patient Location: PACU  Anesthesia Type:General  Level of Consciousness: awake and alert   Airway & Oxygen Therapy: Patient Spontanous Breathing  Post-op Assessment: Report given to RN and Post -op Vital signs reviewed and stable  Post vital signs: Reviewed and stable  Last Vitals:  Vitals Value Taken Time  BP 137/62 10/25/19 1751  Temp    Pulse 74 10/25/19 1753  Resp 16 10/25/19 1753  SpO2 95 % 10/25/19 1753  Vitals shown include unvalidated device data.  Last Pain:  Vitals:   10/25/19 1351  TempSrc:   PainSc: 0-No pain         Complications: No apparent anesthesia complications

## 2019-10-25 NOTE — Telephone Encounter (Signed)
Surgical clearance was provided to pt during yesterday's appt (10/25/19). 12/26/19 is pt f/u appt to yesterday's surgical clearance appt

## 2019-10-25 NOTE — Progress Notes (Signed)
Orthopedic Tech Progress Note Patient Details:  JATYRA YOHMAN Dec 14, 1960 [id number] OR RN called requesting a MEDIUM CAM WALKER BOOT. I dropped it off at the desk Ortho Devices Type of Ortho Device: CAM walker Ortho Device/Splint Interventions: Other (comment)   Post Interventions Patient Tolerated: Other (comment) Instructions Provided: Other (comment)   Janit Pagan 10/25/2019, 5:44 PM

## 2019-10-25 NOTE — Interval H&P Note (Signed)
History and Physical Interval Note:  10/25/2019 4:01 PM  [name]  has presented today for surgery, with the diagnosis of HAMMER TOE.  The various methods of treatment have been discussed with the patient and family. After consideration of risks, benefits and other options for treatment, the patient has consented to  Procedure(s): HAMMER TOE REPAIR SECOND, THIRD, FOUTH (Left) as a surgical intervention.  The patient's history has been reviewed, patient examined, no change in status, stable for surgery.  I have reviewed the patient's chart and labs.  Questions were answered to the patient's satisfaction.     [name]

## 2019-10-25 NOTE — Brief Op Note (Signed)
10/25/2019  5:59 PM  PATIENT:  [name]  59 y.o. female  PRE-OPERATIVE DIAGNOSIS:  HAMMER TOE  POST-OPERATIVE DIAGNOSIS:  HAMMER TOE  PROCEDURE:  Procedure(s): HAMMER TOE REPAIR SECOND, THIRD, FOUTH (Left)  SURGEON:  Surgeon(s) and Role:    Edrick Kins, DPM - Primary  PHYSICIAN ASSISTANT:   ASSISTANTS: none   ANESTHESIA:   general  EBL:  5 mL   BLOOD ADMINISTERED:none  DRAINS: none   LOCAL MEDICATIONS USED:  MARCAINE    and LIDOCAINE   SPECIMEN:  No Specimen  DISPOSITION OF SPECIMEN:  N/A  COUNTS:  YES  TOURNIQUET:   Total Tourniquet Time Documented: Calf (Left) - 47 minutes Total: Calf (Left) - 47 minutes   DICTATION: .Viviann Spare Dictation  PLAN OF CARE: Discharge to home after PACU  PATIENT DISPOSITION:  PACU - hemodynamically stable.   Delay start of Pharmacological VTE agent (>24hrs) due to surgical blood loss or risk of bleeding: not applicable

## 2019-10-28 NOTE — Op Note (Signed)
OPERATIVE REPORT Patient name: [name] MRN: [id number] DOB: Oct 23, 1960  DOS:  1/14/2021Preop Dx: Hammertoes digits 2-4 left foot. Postop Dx: same  Procedure:  1.  Hammertoe arthrodesis second digit left foot 2.  Hammertoe arthrodesis third digit left foot 3.  Hammertoe arthrodesis fourth digit left foot   Surgeon: Edrick Kins DPM  Anesthesia: 50-50 mixture of 2% lidocaine plain with 0.5% Marcaine plain totaling 20 mL infiltrated in the patient's left lower extremity  Hemostasis: Ankle tourniquet inflated to a pressure of 239mmHg after esmarch exsanguination   EBL: Minimal mL Materials: Orthopro cannulated screws x 3 Injectables: None Pathology: None  Condition: The patient tolerated the procedure and anesthesia well. No complications noted or reported   Justification for procedure: The patient is a 59 y.o. female who presents today for surgical correction of symptomatic hammertoes to digits 2-4 of the left foot. All conservative modalities of been unsuccessful in providing any sort of satisfactory alleviation of symptoms with the patient. The patient was told benefits as well as possible side effects of the surgery. The patient consented for surgical correction. The patient consent form was reviewed. All patient questions were answered. No guarantees were expressed or implied. The patient and the surgeon both signed the patient consent form with the witness present and placed in the patient's chart.   Procedure in Detail: The patient was brought to the operating room, placed in the operating table in the supine position at which time an aseptic scrub and drape were performed about the patient's respective lower extremity after anesthesia was induced as described above. Attention was then directed to the surgical area where procedure number one commenced.  Procedure #1: Hammertoe arthrodesis second digit left foot A transverse elliptical incision was planned  and made overlying the PIPJ of the second digit right foot.  The ellipsed wedge of skin was removed and transverse tenotomy was performed of the EDL tendon to expose the underlying PIPJ.  The hypertrophic head of the PIPJ was then surgically removed with a sagittal saw.  The cartilaginous portion of the base of the middle phalanx was also debrided away to allow for better arthrodesis and fusion of the toe joint.  After appropriate preparation of the joint and Ortho pro 2.5 millimeter screw was driven percutaneously through the distal tuft of the digit across the DIPJ and PIPJ.  Screw was inserted in the standard AO fashion.  A small stab incision was made overlying the distal tuft of the toe to allow for the screw placement.  Intraoperative x-ray fluoroscopy was utilized to visualize the correction of the hammertoe as well as placement of the screw which was satisfactory.  Incision sites were then copiously irrigated with normal saline in preparation for primary closure.  4-0 Vicryl sutures utilized to reapproximate the EDL tendon followed by 4-0 Prolene suture to reapproximate superficial skin edges of both incision sites.  Procedure #2: Hammertoe arthrodesis third digit left foot Procedure #2 commenced in the exact same fashion manner as procedure #1 with the exception that procedure #2 was performed to the third digit right foot  Procedure #3: Hammertoe arthrodesis fourth digit left foot Procedure #3 commenced in the exact same fashion manner as procedure #1 with the exception that procedure #3 was performed to the fourth digit right foot   Dry sterile compressive dressings were then applied to all previously mentioned incision sites about the patient's lower extremity. The tourniquet which was used for hemostasis was deflated. All normal neurovascular responses including pink color  and warmth returned all the digits of patient's lower extremity.  The patient was then transferred from the operating  room to the recovery room having tolerated the procedure and anesthesia well. All vital signs are stable. After a brief stay in the recovery room the patient was discharged with adequate prescriptions for analgesia. Verbal as well as written instructions were provided for the patient regarding wound care. The patient is to keep the dressings clean dry and intact until they are to follow surgeon Dr. Daylene Katayama in the office upon discharge.   Edrick Kins, DPM [facility]  Dr. Edrick Kins, [address]                                             [place], [place] [address]                Office [phone]  Fax [phone]

## 2019-10-29 ENCOUNTER — Encounter: Payer: Self-pay | Admitting: *Deleted

## 2019-10-31 ENCOUNTER — Other Ambulatory Visit: Payer: Self-pay

## 2019-10-31 ENCOUNTER — Ambulatory Visit (INDEPENDENT_AMBULATORY_CARE_PROVIDER_SITE_OTHER): Payer: 59

## 2019-10-31 ENCOUNTER — Ambulatory Visit (INDEPENDENT_AMBULATORY_CARE_PROVIDER_SITE_OTHER): Payer: Medicare Other | Admitting: Podiatry

## 2019-10-31 VITALS — Temp 96.4°F

## 2019-10-31 DIAGNOSIS — M2042 Other hammer toe(s) (acquired), left foot: Secondary | ICD-10-CM

## 2019-10-31 DIAGNOSIS — Z9889 Other specified postprocedural states: Secondary | ICD-10-CM

## 2019-11-01 ENCOUNTER — Ambulatory Visit (INDEPENDENT_AMBULATORY_CARE_PROVIDER_SITE_OTHER)
Admission: RE | Admit: 2019-11-01 | Discharge: 2019-11-01 | Disposition: A | Discharge status: Home / Self Care | Payer: 59 | Source: Ambulatory Visit | Attending: Family Medicine | Admitting: Family Medicine

## 2019-11-01 DIAGNOSIS — Z0000 Encounter for general adult medical examination without abnormal findings: Secondary | ICD-10-CM

## 2019-11-01 DIAGNOSIS — Z780 Asymptomatic menopausal state: Secondary | ICD-10-CM | POA: Diagnosis not present

## 2019-11-02 ENCOUNTER — Encounter: Payer: Self-pay | Admitting: Family Medicine

## 2019-11-02 ENCOUNTER — Ambulatory Visit: Payer: 59 | Admitting: Family Medicine

## 2019-11-06 NOTE — Progress Notes (Signed)
Subjective:  Patient presents today status post hammertoe repair digits 2-4 of the left foot.. DOS: 10/31/2019.  Patient denies pain.  She states that she is only had to take 2 pain medicine tablets over the past week.  She is doing very well overall.  She has been weightbearing in the cam boot as directed.  Past Medical History:  Diagnosis Date  . Abnormal SPEP 04/17/2014  . Acute left ankle pain 01/26/2017  . ANEMIA-UNSPECIFIED 09/18/2009  . Anxiety   . Arthritis   . CHF (congestive heart failure) ([place])   . Chronic diastolic heart failure, NYHA class 2 (HCC)    Normal LVEDP by May 2018  . COPD (chronic obstructive pulmonary disease) ([place])   . Depression   . DIABETES MELLITUS, TYPE II 08/21/2006  . Diabetic osteomyelitis ([place]) 05/29/2015  . Fracture of 5th metatarsal    non union  . GERD [id number]   Had fundoplication  . GOUT 08/20/2010  . Hammer toe of right foot    2-5th toes  . Hx of umbilical hernia repair   . HYPERLIPIDEMIA 08/21/2006  . HYPERTENSION 08/21/2006  . Infection of wound due to methicillin resistant Staphylococcus aureus (MRSA)   . Internal hemorrhoids   . Multiple allergies 10/14/2016  . OBESITY 06/04/2009  . Onychomycosis 10/27/2015  . Osteomyelitis of left foot ([place]) 05/29/2015  . Pulmonary sarcoidosis ([place])    Followed locally by pulmonology, but also by Dr. Casper Harrison at [facility]  . Right knee pain 01/26/2017  . Vocal cord dysfunction   . Wears partial dentures       Objective/Physical Exam Neurovascular status intact.  Skin incisions appear to be well coapted with sutures and staples intact. No sign of infectious process noted. No dehiscence. No active bleeding noted. Moderate edema noted to the surgical extremity.  Radiographic Exam:  Orthopedic hardware is intact to the third and fourth digits of the left foot where the hammertoe repair was performed.  In regards to the second toe, it appears that there is some cortical breaking dorsally and  the intramedullary screw is intact to the distal and middle phalanx, however it appears to have broken outside of the cortex and is now lying dorsal to the proximal phalanx.  Assessment: 1. s/p hammertoe repair digits 2-4 left. DOS: 10/31/2019   Plan of Care:  1. Patient was evaluated. X-rays reviewed 2.  In regards to the screw for the second toe hammertoe repair, this screw may need to be removed at one point.  This can likely be done in the office as an in office procedure.  For the moment we will leave everything intact and allow for as much healing and decrease of swelling as possible.  Patient currently is not suffering any complications associated to the dorsal displacement of the proximal portion of the screw 3.  Today dressings were changed.  Keep clean dry and intact for 1 week 4.  Continue weightbearing in the cam boot 5.  Return to clinic in 1 week   Edrick Kins, DPM [facility]  Dr. Edrick Kins, [address]                                        [place], [place] [address]                Office [phone]  Fax [phone]

## 2019-11-07 ENCOUNTER — Other Ambulatory Visit: Payer: Self-pay

## 2019-11-07 ENCOUNTER — Ambulatory Visit (INDEPENDENT_AMBULATORY_CARE_PROVIDER_SITE_OTHER): Payer: Medicare Other | Admitting: Podiatry

## 2019-11-07 DIAGNOSIS — M2042 Other hammer toe(s) (acquired), left foot: Secondary | ICD-10-CM

## 2019-11-07 DIAGNOSIS — Z9889 Other specified postprocedural states: Secondary | ICD-10-CM

## 2019-11-13 NOTE — Progress Notes (Signed)
   Subjective:  Patient presents today status post hammertoe repair digits 2-4 of the left foot.. DOS: 10/31/2019.  Patient denies pain.  Patient states that she is doing very well and has no concerns at the moment.  Past Medical History:  Diagnosis Date  . Abnormal SPEP 04/17/2014  . Acute left ankle pain 01/26/2017  . ANEMIA-UNSPECIFIED 09/18/2009  . Anxiety   . Arthritis   . CHF (congestive heart failure) ([place])   . Chronic diastolic heart failure, NYHA class 2 (HCC)    Normal LVEDP by May 2018  . COPD (chronic obstructive pulmonary disease) ([place])   . Depression   . DIABETES MELLITUS, TYPE II 08/21/2006  . Diabetic osteomyelitis ([place]) 05/29/2015  . Fracture of 5th metatarsal    non union  . GERD [id number]   Had fundoplication  . GOUT 08/20/2010  . Hammer toe of right foot    2-5th toes  . Hx of umbilical hernia repair   . HYPERLIPIDEMIA 08/21/2006  . HYPERTENSION 08/21/2006  . Infection of wound due to methicillin resistant Staphylococcus aureus (MRSA)   . Internal hemorrhoids   . Multiple allergies 10/14/2016  . OBESITY 06/04/2009  . Onychomycosis 10/27/2015  . Osteomyelitis of left foot ([place]) 05/29/2015  . Pulmonary sarcoidosis ([place])    Followed locally by pulmonology, but also by Dr. Casper Harrison at [facility]  . Right knee pain 01/26/2017  . Vocal cord dysfunction   . Wears partial dentures       Objective/Physical Exam Neurovascular status intact.  Skin incisions appear to be well coapted with sutures  intact. No sign of infectious process noted. No dehiscence. No active bleeding noted. Moderate edema noted to the surgical extremity.  Assessment: 1. s/p hammertoe repair digits 2-4 left. DOS: 10/31/2019   Plan of Care:  1. Patient was evaluated. X-rays reviewed again today that were taken last visit 2.  In regards to the screw for the second toe hammertoe repair, this screw may need to be removed at one point.  This can likely be done in the office as an in office  procedure.  For the moment we will leave everything intact and allow for as much healing and decrease of swelling as possible.  Patient currently is not suffering any complications associated to the dorsal displacement of the proximal portion of the screw.  Patient understands 3.  Return to clinic in 1 week for suture removal  Edrick Kins, DPM [facility]  Dr. Edrick Kins, [address]                                        [place], [place] [address]                Office [phone]  Fax [phone]

## 2019-11-14 ENCOUNTER — Other Ambulatory Visit: Payer: Self-pay

## 2019-11-14 ENCOUNTER — Ambulatory Visit (INDEPENDENT_AMBULATORY_CARE_PROVIDER_SITE_OTHER): Payer: Medicare Other | Admitting: Podiatry

## 2019-11-14 DIAGNOSIS — M2042 Other hammer toe(s) (acquired), left foot: Secondary | ICD-10-CM

## 2019-11-14 DIAGNOSIS — Z9889 Other specified postprocedural states: Secondary | ICD-10-CM

## 2019-11-14 DIAGNOSIS — T8484XA Pain due to internal orthopedic prosthetic devices, implants and grafts, initial encounter: Secondary | ICD-10-CM

## 2019-11-14 MED ORDER — SULFAMETHOXAZOLE-TRIMETHOPRIM 800-160 MG PO TABS: 1.0000 | ORAL_TABLET | Freq: Two times a day (BID) | ORAL | 0 refills | Status: DC

## 2019-11-19 ENCOUNTER — Other Ambulatory Visit: Payer: Self-pay

## 2019-11-19 ENCOUNTER — Ambulatory Visit (INDEPENDENT_AMBULATORY_CARE_PROVIDER_SITE_OTHER): Payer: Medicare Other

## 2019-11-19 ENCOUNTER — Ambulatory Visit (INDEPENDENT_AMBULATORY_CARE_PROVIDER_SITE_OTHER): Payer: 59 | Admitting: Podiatry

## 2019-11-19 DIAGNOSIS — T8484XA Pain due to internal orthopedic prosthetic devices, implants and grafts, initial encounter: Secondary | ICD-10-CM

## 2019-11-19 DIAGNOSIS — M2042 Other hammer toe(s) (acquired), left foot: Secondary | ICD-10-CM

## 2019-11-19 NOTE — Progress Notes (Signed)
   Subjective:  Patient presents today status post hammertoe repair digits 2-4 of the left foot.. DOS: 10/31/2019.  Patient also has history of hammer toe repair of digits 2-4 of the right foot previously.  Upon evaluation on previous visit the screw noted to the right fourth digit was backing out and the head of the screw was actually protruded past the skin.  I did not have a driver at that time so she is scheduled to return today this a.m. for in office screw removal.  She has had some tenderness to this toe for the past week.  She is currently on oral antibiotics as prescribed last visit she presents for further treatment evaluation  Past Medical History:  Diagnosis Date  . Abnormal SPEP 04/17/2014  . Acute left ankle pain 01/26/2017  . ANEMIA-UNSPECIFIED 09/18/2009  . Anxiety   . Arthritis   . CHF (congestive heart failure) ([place])   . Chronic diastolic heart failure, NYHA class 2 (HCC)    Normal LVEDP by May 2018  . COPD (chronic obstructive pulmonary disease) ([place])   . Depression   . DIABETES MELLITUS, TYPE II 08/21/2006  . Diabetic osteomyelitis ([place]) 05/29/2015  . Fracture of 5th metatarsal    non union  . GERD [id number]   Had fundoplication  . GOUT 08/20/2010  . Hammer toe of right foot    2-5th toes  . Hx of umbilical hernia repair   . HYPERLIPIDEMIA 08/21/2006  . HYPERTENSION 08/21/2006  . Infection of wound due to methicillin resistant Staphylococcus aureus (MRSA)   . Internal hemorrhoids   . Multiple allergies 10/14/2016  . OBESITY 06/04/2009  . Onychomycosis 10/27/2015  . Osteomyelitis of left foot ([place]) 05/29/2015  . Pulmonary sarcoidosis ([place])    Followed locally by pulmonology, but also by Dr. Casper Harrison at [facility]  . Right knee pain 01/26/2017  . Vocal cord dysfunction   . Wears partial dentures       Objective/Physical Exam Neurovascular status intact.  Skin incisions appear to be well coapted with sutures  intact. No sign of infectious process noted. No  dehiscence. No active bleeding noted. Moderate edema noted to the surgical extremity.  Assessment: 1. s/p hammertoe repair digits 2-4 left. DOS: 10/31/2019 2.  Exposed orthopedic screw right fourth digit   Plan of Care:  1. Patient was evaluated.  2.  The exposed screw to the right fourth digit was removed in toto.  Dry sterile dressing was applied.  No local anesthesia was utilized or needed 3.  Continue oral antibiotics as prescribed 4.  Return to clinic in 1 week  Edrick Kins, DPM [facility]  Dr. Edrick Kins, [address]                                        [place], [place] [address]                Office [phone]  Fax [phone]

## 2019-11-20 NOTE — Progress Notes (Signed)
   Subjective:  Patient presents today status post hammertoe repair digits 2-4 of the left foot.. DOS: 10/31/2019.  Patient denies pain.  Patient states that she is doing very well and has no concerns at the moment regarding the left surgical foot. She does have a complaint today of soreness to the right fourth toe.  She had surgery several months prior with internal screw fixation of hammertoes.  She notes some increase in swelling and redness to the right fourth toe with a tender callus on the distal tuft of the toe.  She presents for further treatment evaluation  Past Medical History:  Diagnosis Date  . Abnormal SPEP 04/17/2014  . Acute left ankle pain 01/26/2017  . ANEMIA-UNSPECIFIED 09/18/2009  . Anxiety   . Arthritis   . CHF (congestive heart failure) ([place])   . Chronic diastolic heart failure, NYHA class 2 (HCC)    Normal LVEDP by May 2018  . COPD (chronic obstructive pulmonary disease) ([place])   . Depression   . DIABETES MELLITUS, TYPE II 08/21/2006  . Diabetic osteomyelitis ([place]) 05/29/2015  . Fracture of 5th metatarsal    non union  . GERD [id number]   Had fundoplication  . GOUT 08/20/2010  . Hammer toe of right foot    2-5th toes  . Hx of umbilical hernia repair   . HYPERLIPIDEMIA 08/21/2006  . HYPERTENSION 08/21/2006  . Infection of wound due to methicillin resistant Staphylococcus aureus (MRSA)   . Internal hemorrhoids   . Multiple allergies 10/14/2016  . OBESITY 06/04/2009  . Onychomycosis 10/27/2015  . Osteomyelitis of left foot ([place]) 05/29/2015  . Pulmonary sarcoidosis ([place])    Followed locally by pulmonology, but also by Dr. Casper Harrison at [facility]  . Right knee pain 01/26/2017  . Vocal cord dysfunction   . Wears partial dentures       Objective/Physical Exam Neurovascular status intact.  Skin incisions appear to be well coapted with sutures  intact. No sign of infectious process noted. No dehiscence. No active bleeding noted.  Minimal edema noted to the  surgical extremity. After debridement of the distal tuft callus to the right fourth toe there is an exposed head of the screw to the right fourth digit.  No malodor noted.  Mild erythema noted to the toe  Assessment: 1. s/p hammertoe repair digits 2-4 left. DOS: 10/31/2019 2.  Exposed orthopedic screw right fourth toe   Plan of Care:  1. Patient was evaluated. 2.  Currently I do not have the correct driver to remove the screw from the right fourth toe.  Return to clinic this following Monday and I will have the screwdriver to remove the screw here in the office. 3.  Betadine and dry sterile dressing was applied to the right foot with instructions to keep clean dry and intact 4.  Prescription for doxycycline 100 mg 2 times daily 5.  Return to clinic this following Monday for screw removal  Edrick Kins, DPM [facility]  Dr. Edrick Kins, [address]                                        [place], [place] [address]                Office [phone]  Fax [phone]

## 2019-11-21 ENCOUNTER — Encounter: Payer: Medicare Other | Admitting: Podiatry

## 2019-11-24 ENCOUNTER — Other Ambulatory Visit: Payer: Self-pay | Admitting: Family Medicine

## 2019-11-25 ENCOUNTER — Other Ambulatory Visit: Payer: Self-pay | Admitting: Family Medicine

## 2019-11-28 ENCOUNTER — Other Ambulatory Visit: Payer: Self-pay | Admitting: Cardiology

## 2019-12-03 ENCOUNTER — Other Ambulatory Visit: Payer: Self-pay

## 2019-12-03 ENCOUNTER — Ambulatory Visit (INDEPENDENT_AMBULATORY_CARE_PROVIDER_SITE_OTHER): Payer: 59

## 2019-12-03 ENCOUNTER — Ambulatory Visit (INDEPENDENT_AMBULATORY_CARE_PROVIDER_SITE_OTHER): Payer: Medicare Other | Admitting: Podiatry

## 2019-12-03 DIAGNOSIS — T8484XA Pain due to internal orthopedic prosthetic devices, implants and grafts, initial encounter: Secondary | ICD-10-CM

## 2019-12-03 DIAGNOSIS — M2042 Other hammer toe(s) (acquired), left foot: Secondary | ICD-10-CM | POA: Diagnosis not present

## 2019-12-10 ENCOUNTER — Encounter: Payer: Self-pay | Admitting: Podiatry

## 2019-12-10 ENCOUNTER — Ambulatory Visit (INDEPENDENT_AMBULATORY_CARE_PROVIDER_SITE_OTHER): Payer: Medicare Other | Admitting: Podiatry

## 2019-12-10 ENCOUNTER — Ambulatory Visit (INDEPENDENT_AMBULATORY_CARE_PROVIDER_SITE_OTHER): Payer: 59

## 2019-12-10 ENCOUNTER — Other Ambulatory Visit: Payer: Self-pay

## 2019-12-10 VITALS — Temp 97.6°F

## 2019-12-10 DIAGNOSIS — L02619 Cutaneous abscess of unspecified foot: Secondary | ICD-10-CM

## 2019-12-10 DIAGNOSIS — L03119 Cellulitis of unspecified part of limb: Secondary | ICD-10-CM

## 2019-12-10 DIAGNOSIS — T8484XA Pain due to internal orthopedic prosthetic devices, implants and grafts, initial encounter: Secondary | ICD-10-CM | POA: Diagnosis not present

## 2019-12-10 DIAGNOSIS — Z472 Encounter for removal of internal fixation device: Secondary | ICD-10-CM

## 2019-12-11 ENCOUNTER — Other Ambulatory Visit: Payer: Self-pay | Admitting: Family Medicine

## 2019-12-11 NOTE — Progress Notes (Signed)
   Subjective:  Patient presents today status post hammertoe repair digits 2-4 of the left foot.. DOS: 10/31/2019.  Patient also has history of hammer toe repair of digits 2-4 of the right foot previously.  Last visit on 11/19/2019 she had an orthopedic screw removed from the right fourth digit.  This is healed significantly and she is currently doing very well.  She presents today to address possible removal regarding the orthopedic screw to the left second toe.  She continues to have some pain to the left second toe with a screw that is in malposition and is ruptured through the cortex of the bone based on previous radiographic exams.  No other complaints at this time.  Past Medical History:  Diagnosis Date  . Abnormal SPEP 04/17/2014  . Acute left ankle pain 01/26/2017  . ANEMIA-UNSPECIFIED 09/18/2009  . Anxiety   . Arthritis   . CHF (congestive heart failure) ([place])   . Chronic diastolic heart failure, NYHA class 2 (HCC)    Normal LVEDP by May 2018  . COPD (chronic obstructive pulmonary disease) ([place])   . Depression   . DIABETES MELLITUS, TYPE II 08/21/2006  . Diabetic osteomyelitis ([place]) 05/29/2015  . Fracture of 5th metatarsal    non union  . GERD [id number]   Had fundoplication  . GOUT 08/20/2010  . Hammer toe of right foot    2-5th toes  . Hx of umbilical hernia repair   . HYPERLIPIDEMIA 08/21/2006  . HYPERTENSION 08/21/2006  . Infection of wound due to methicillin resistant Staphylococcus aureus (MRSA)   . Internal hemorrhoids   . Multiple allergies 10/14/2016  . OBESITY 06/04/2009  . Onychomycosis 10/27/2015  . Osteomyelitis of left foot ([place]) 05/29/2015  . Pulmonary sarcoidosis ([place])    Followed locally by pulmonology, but also by Dr. Casper Harrison at [facility]  . Right knee pain 01/26/2017  . Vocal cord dysfunction   . Wears partial dentures       Objective/Physical Exam Neurovascular status intact.  Skin incisions appear to be well coapted with sutures  intact. No sign  of infectious process noted. No dehiscence. No active bleeding noted. Moderate edema noted to the surgical extremity.  Assessment: 1. s/p hammertoe repair digits 2-4 left. DOS: 10/31/2019 2.  Symptomatic orthopedic screw left second toe   Plan of Care:  1. Patient was evaluated.  2.  Today explained that we can remove the orthopedic screw to the left second toe in office.  All possible complications and details the procedure were explained.  No guarantees were expressed or implied. 3.  Authorization for in office surgery was initiated today.  Surgery will consist of removal of orthopedic screw left second toe 4.  Return to clinic morning of in office surgery  Edrick Kins, DPM [facility]  Dr. Edrick Kins, [address]                                        [place], [place] [address]                Office [phone]  Fax [phone]

## 2019-12-12 NOTE — Progress Notes (Signed)
Subjective:   Patient ID: [name], female   DOB: 59 y.o.   MRN: [id number]   HPI Patient presents stating she had spoken to Dr. Amalia Hailey last week or put her on antibiotics and she can now see a screw in her third digit left and it is irritating and she needs to have it removed along with other screws that she had spoken to Dr. Amalia Hailey about   ROS      Objective:  Physical Exam  Neurovascular status intact with prominent screw third digit left lateral side from previous procedure with redness around the area but no active drainage erythema edema noted currently and no systemic signs of infection.  Patient is on doxycycline currently and it seems to be controlling her pain but she would like to get these out relatively soon if possible     Assessment:  Prominent pin third digit left foot with other pins that are irritated for her with possibility of having previous low-grade infection which is responded to antibiotics     Plan:  H&P x-rays reviewed condition discussed.  I do think screw removal is necessary but will need to be ordered and I went ahead today and at this point I educated her on screw removal and I will speak about this case with Dr. Amalia Hailey and get her scheduled.  Continue antibiotic and strict instructions of any erythema edema or drainage were to occur to let us know immediately  X-rays indicate that there is screw third digit left which is laterally displaced distal phalanx with other screws which may be giving her irritation and other fixation which is not bothering her currently

## 2019-12-13 ENCOUNTER — Telehealth: Payer: Self-pay | Admitting: *Deleted

## 2019-12-13 NOTE — Telephone Encounter (Signed)
Discussed with Dr. Amalia Hailey. He needs to arrange for the screws to come out next week

## 2019-12-13 NOTE — Telephone Encounter (Signed)
Pt called states she saw Dr. Paulla Dolly and he was going to discuss the pin that is coming out of her foot, with Dr. Amalia Hailey.

## 2019-12-13 NOTE — Telephone Encounter (Signed)
Left message for pt to call to schedule for appt Monday to discuss surgery and sign consents.

## 2019-12-14 ENCOUNTER — Telehealth: Payer: Self-pay | Admitting: *Deleted

## 2019-12-14 ENCOUNTER — Telehealth: Payer: Self-pay | Admitting: Podiatry

## 2019-12-14 NOTE — Telephone Encounter (Signed)
Called pt  lvm to remind pt of her office surgru sched 12/26/19 w/[facility]

## 2019-12-14 NOTE — Telephone Encounter (Signed)
We can sign consents at the same time she comes in to have the screws removed. - Dr. Amalia Hailey

## 2019-12-14 NOTE — Telephone Encounter (Signed)
-----   Message from Edrick Kins, DPM sent at 12/14/2019  3:33 PM EST ----- Regarding: Set up in-office screw removal Valery,  Will you please arrange an in-office screw removal for either Monday or Wednesday, morning or lunch time? I will also need to get the screw driver from the surgery center to remove the screws.   Thanks, Dr. Amalia Hailey

## 2019-12-17 ENCOUNTER — Other Ambulatory Visit: Payer: Self-pay | Admitting: Family Medicine

## 2019-12-17 ENCOUNTER — Telehealth: Payer: Self-pay | Admitting: *Deleted

## 2019-12-17 NOTE — Telephone Encounter (Signed)
[name],  Didn't you say the patient was coming in this Wednesday? Can we get her in this Wednesday instead of Wednesday the 17th? Thanks, Dr. Amalia Hailey

## 2019-12-17 NOTE — Telephone Encounter (Signed)
Left message informing pt Dr. Amalia Hailey stated the doxycycline will cover pt, and to flush or paint the toe with exposed screw with betadine and dress with gauze until seen in office, and to call me with the number of doxycycline left.

## 2019-12-17 NOTE — Telephone Encounter (Signed)
Left message on pt's phone stating that Dr. Amalia Hailey had previously stated if necessary another procedure could be added to the consent form if needed at the 12/26/2019 surgery appt.

## 2019-12-17 NOTE — Telephone Encounter (Signed)
Pt's office sx has been rescheduled to this Wednesday at 12 noon. I informed the pt to arrive at 11:45.

## 2019-12-17 NOTE — Telephone Encounter (Signed)
Pt's husband, Gwyndolyn Saxon states pt is scheduled Wednesday for a different toe, but the other toe has exposed screw beneath popped up toenail.

## 2019-12-19 ENCOUNTER — Other Ambulatory Visit: Payer: Self-pay

## 2019-12-19 ENCOUNTER — Ambulatory Visit (INDEPENDENT_AMBULATORY_CARE_PROVIDER_SITE_OTHER): Payer: 59 | Admitting: Podiatry

## 2019-12-19 ENCOUNTER — Other Ambulatory Visit: Payer: Self-pay | Admitting: Podiatry

## 2019-12-19 ENCOUNTER — Ambulatory Visit (INDEPENDENT_AMBULATORY_CARE_PROVIDER_SITE_OTHER): Payer: 59

## 2019-12-19 DIAGNOSIS — M79671 Pain in right foot: Secondary | ICD-10-CM

## 2019-12-19 DIAGNOSIS — T8484XA Pain due to internal orthopedic prosthetic devices, implants and grafts, initial encounter: Secondary | ICD-10-CM

## 2019-12-19 MED ORDER — DOXYCYCLINE HYCLATE 100 MG PO TABS: 100.0000 mg | ORAL_TABLET | Freq: Two times a day (BID) | ORAL | 0 refills | Status: DC

## 2019-12-19 NOTE — Progress Notes (Signed)
OPERATIVE REPORT Patient name: [name] MRN: [id number] DOB: [birth year]  DOS:  12/19/19  Preop Dx: symptomatic orthopedic screws x 2 left foot Postop Dx: same  Procedure:  1. Removal of orthopedic screw left 2nd digit 2. Removal of orthopedic screw left 3rd digit  Surgeon: Edrick Kins DPM  Anesthesia: 50-50 mixture of 2% lidocaine plain with 0.5% Marcaine plain totaling 6 mm infiltrated in the patient's left second and third digits left lower extremity  Hemostasis: Ankle tourniquet inflated to a pressure of 259mmHg after esmarch exsanguination   EBL: Minimal mL Materials: None Injectables: None Pathology: None  Condition: The patient tolerated the procedure and anesthesia well. No complications noted or reported   Justification for procedure: The patient is a 59 y.o. female who presents today for surgical correction of symptomatic orthopedic screws to the second third digit of the left foot.  Patient has a history of hammertoe repair with internal screw fixation.  The screws ended up backing out and causing pain.. All conservative modalities of been unsuccessful in providing any sort of satisfactory alleviation of symptoms with the patient. The patient was told benefits as well as possible side effects of the surgery. The patient consented for surgical correction. The patient consent form was reviewed. All patient questions were answered. No guarantees were expressed or implied. The patient and the surgeon boson the patient consent form with the witness present and placed in the patient's chart.   Procedure in Detail: The patient was brought to the procedure room, placed in the procedure chair in the supine position at which time an aseptic scrub and drape were performed about the patient's respective lower extremity after anesthesia was induced as described above. Attention was then directed to the surgical area where procedure number one commenced.  Procedure #1:  Removal orthopedic screw second digit left foot A 0.5 cm skin incision was planned and made over the distal tuft of the left second digit.  Incision was carried down to the level of bone and orthopedic screw which was identified.  The screw was identified and removed in toto.  Irrigation was utilized and primary closure was obtained using 4-0 nylon suture.  Procedure #2: Removal orthopedic screw third digit left foot The orthopedic screw to the third digit left foot was actually exposed just lateral to the nail plate.  The head of the screw was identified and the screwdriver was utilized to remove the screw completely.  No primary closure was warranted as there was only a small screw hole which was dressed with dry sterile dressing and cleansed with Betadine  Dry sterile compressive dressings were then applied to all previously mentioned incision sites about the patient's lower extremity. The tourniquet which was used for hemostasis was deflated. All normal neurovascular responses including pink color and warmth returned all the digits of patient's lower extremity.  The patient was then discharged with a new prescription for doxycycline 100 mg twice daily. Verbal as well as written instructions were provided for the patient regarding wound care. The patient is to keep the dressings clean dry and intact until they are to follow surgeon Dr. Daylene Katayama in the office upon discharge.   Edrick Kins, DPM [facility]  Dr. Edrick Kins, DPM    [address]  [place], [place] [address]                Office [phone]  Fax [phone]

## 2019-12-19 NOTE — Progress Notes (Signed)
   HPI: 59 y.o. female presenting today in addition to the removal of screws to the left foot which has been dictated in the op report she does complain of some right foot pain lateral aspect.  This is been going on for several months now.  It is very painful to walk.  She states that her husband lightly touch the area and it was very sensitive.  She presents for further treatment evaluation  Past Medical History:  Diagnosis Date  . Abnormal SPEP 04/17/2014  . Acute left ankle pain 01/26/2017  . ANEMIA-UNSPECIFIED 09/18/2009  . Anxiety   . Arthritis   . CHF (congestive heart failure) ([place])   . Chronic diastolic heart failure, NYHA class 2 (HCC)    Normal LVEDP by May 2018  . COPD (chronic obstructive pulmonary disease) ([place])   . Depression   . DIABETES MELLITUS, TYPE II 08/21/2006  . Diabetic osteomyelitis ([place]) 05/29/2015  . Fracture of 5th metatarsal    non union  . GERD [id number]   Had fundoplication  . GOUT 08/20/2010  . Hammer toe of right foot    2-5th toes  . Hx of umbilical hernia repair   . HYPERLIPIDEMIA 08/21/2006  . HYPERTENSION 08/21/2006  . Infection of wound due to methicillin resistant Staphylococcus aureus (MRSA)   . Internal hemorrhoids   . Multiple allergies 10/14/2016  . OBESITY 06/04/2009  . Onychomycosis 10/27/2015  . Osteomyelitis of left foot ([place]) 05/29/2015  . Pulmonary sarcoidosis ([place])    Followed locally by pulmonology, but also by Dr. Casper Harrison at [facility]  . Right knee pain 01/26/2017  . Vocal cord dysfunction   . Wears partial dentures      Physical Exam: General: The patient is alert and oriented x3 in no acute distress.  Dermatology: Skin is warm, dry and supple bilateral lower extremities. Negative for open lesions or macerations.  Vascular: Palpable pedal pulses bilaterally. No edema or erythema noted. Capillary refill within normal limits.  Neurological: Epicritic and protective threshold grossly intact bilaterally.    Musculoskeletal Exam: Range of motion within normal limits to all pedal and ankle joints bilateral. Muscle strength 5/5 in all groups bilateral.  Pain on palpation along the fifth metatarsal tubercle of the right foot consistent with insertional peroneal tendinitis  Radiographic Exam:  Normal osseous mineralization. Joint spaces preserved. No fracture/dislocation/boney destruction.  No fracture identified along the fifth metatarsal tubercle  Assessment: 1.  Insertional peroneal tendinitis right   Plan of Care:  1. Patient evaluated. X-Rays reviewed.  2.  Recommend that the patient be weightbearing in a cam boot as tolerated x3 weeks 3.  Removal of screws was performed to the left foot second and third digit as dictated in the op report here in the office 4.  Prescription for doxycycline 100 mg twice daily #20  5.  Return to clinic in 1 week      Edrick Kins, DPM [facility]  Dr. Edrick Kins, DPM    [address], [place] [address]                Office [phone]  Fax [phone]

## 2019-12-25 ENCOUNTER — Other Ambulatory Visit: Payer: Self-pay | Admitting: Cardiology

## 2019-12-25 ENCOUNTER — Other Ambulatory Visit: Payer: Self-pay | Admitting: Family Medicine

## 2019-12-26 ENCOUNTER — Other Ambulatory Visit: Payer: Self-pay

## 2019-12-26 ENCOUNTER — Ambulatory Visit: Payer: Medicare Other | Admitting: Podiatry

## 2019-12-26 ENCOUNTER — Ambulatory Visit: Payer: 59 | Admitting: Podiatry

## 2019-12-26 ENCOUNTER — Ambulatory Visit: Payer: 59 | Admitting: Endocrinology

## 2019-12-26 LAB — HM DIABETES EYE EXAM

## 2019-12-28 ENCOUNTER — Ambulatory Visit: Payer: 59 | Admitting: Endocrinology

## 2019-12-31 ENCOUNTER — Other Ambulatory Visit: Payer: Self-pay

## 2019-12-31 ENCOUNTER — Ambulatory Visit (INDEPENDENT_AMBULATORY_CARE_PROVIDER_SITE_OTHER): Payer: 59

## 2019-12-31 ENCOUNTER — Ambulatory Visit (INDEPENDENT_AMBULATORY_CARE_PROVIDER_SITE_OTHER): Payer: 59 | Admitting: Podiatry

## 2019-12-31 DIAGNOSIS — T8484XA Pain due to internal orthopedic prosthetic devices, implants and grafts, initial encounter: Secondary | ICD-10-CM

## 2019-12-31 DIAGNOSIS — Z9889 Other specified postprocedural states: Secondary | ICD-10-CM

## 2020-01-02 ENCOUNTER — Encounter: Payer: Medicare Other | Admitting: Podiatry

## 2020-01-07 ENCOUNTER — Other Ambulatory Visit: Payer: Self-pay | Admitting: Podiatry

## 2020-01-07 ENCOUNTER — Other Ambulatory Visit: Payer: Self-pay | Admitting: Pulmonary Disease

## 2020-01-07 MED ORDER — DOXYCYCLINE HYCLATE 100 MG PO TABS: 100.0000 mg | ORAL_TABLET | Freq: Two times a day (BID) | ORAL | 0 refills | Status: DC

## 2020-01-08 NOTE — Progress Notes (Signed)
HPI: 59 y.o. female presenting today status post removal of hardware on 12/19/2019.  Patient has slowly developed complications from multiple hammertoe correction deformities with orthopedic screws in the toes.  She denies pain.  She is concerned that approximately 1-2 days ago she noticed a small dark spot and possible screw head to the right second toe.  It is very tender to touch.  She has not done anything for treatment.  Past Medical History:  Diagnosis Date  . Abnormal SPEP 04/17/2014  . Acute left ankle pain 01/26/2017  . ANEMIA-UNSPECIFIED 09/18/2009  . Anxiety   . Arthritis   . CHF (congestive heart failure) ([place])   . Chronic diastolic heart failure, NYHA class 2 (HCC)    Normal LVEDP by May 2018  . COPD (chronic obstructive pulmonary disease) ([place])   . Depression   . DIABETES MELLITUS, TYPE II 08/21/2006  . Diabetic osteomyelitis ([place]) 05/29/2015  . Fracture of 5th metatarsal    non union  . GERD [id number]   Had fundoplication  . GOUT 08/20/2010  . Hammer toe of right foot    2-5th toes  . Hx of umbilical hernia repair   . HYPERLIPIDEMIA 08/21/2006  . HYPERTENSION 08/21/2006  . Infection of wound due to methicillin resistant Staphylococcus aureus (MRSA)   . Internal hemorrhoids   . Multiple allergies 10/14/2016  . OBESITY 06/04/2009  . Onychomycosis 10/27/2015  . Osteomyelitis of left foot ([place]) 05/29/2015  . Pulmonary sarcoidosis ([place])    Followed locally by pulmonology, but also by Dr. Casper Harrison at [facility]  . Right knee pain 01/26/2017  . Vocal cord dysfunction   . Wears partial dentures      Physical Exam: General: The patient is alert and oriented x3 in no acute distress.  Dermatology: Skin is warm, dry and supple bilateral lower extremities. Negative for open lesions or macerations.  There is a small callused area with a dark discoloration which is likely a screw backing out of the toe.  Tenderness to palpation noted.  Vascular: Palpable pedal pulses  bilaterally. No edema or erythema noted. Capillary refill within normal limits.  Neurological: Epicritic and protective threshold grossly intact bilaterally.   Musculoskeletal Exam: Status post hammertoe correction bilateral feet with subsequent removal of orthopedic screws  Radiographic Exam:  Normal osseous mineralization.  Orthopedic screws maintained within the fourth toes bilaterally as well as the right second toe.  Orthopedic screw noted to the hallux is intact without any complications thus far.  Assessment: 1.  Status post removal of orthopedic screw second and third digits left foot 2.  Symptomatic screw second digit right foot. 3.  Retained orthopedic screws fourth digit bilateral   Plan of Care:  1. Patient evaluated. X-Rays reviewed.  2.  The patient has slowly developed complications with backing out of screws to the bilateral hammertoe correction screws.  I explained to the patient I think it is in her best interest to remove the remaining screws out of the hammertoes with remaining hardware.  This consists of the fourth digits bilateral and second digit right foot.  Patient agrees and would like all the screws removed.  We will leave the screw to the hallux since it is not symptomatic and was placed over 1 year ago 3.  Authorization for surgery initiated today's.  Surgery will consist of removal of orthopedic screws fourth digit bilateral.  Second digit right foot.  All possible complications and details of the procedure were explained.  No guarantees were expressed or  implied. 4.  Return to clinic day of an operative removal of the orthopedic screws      Edrick Kins, DPM [facility]  Dr. Edrick Kins, DPM    [address], [place] [address]                Office [phone]  Fax [phone]

## 2020-01-09 ENCOUNTER — Ambulatory Visit (INDEPENDENT_AMBULATORY_CARE_PROVIDER_SITE_OTHER): Payer: 59 | Admitting: Podiatry

## 2020-01-09 ENCOUNTER — Other Ambulatory Visit: Payer: Self-pay

## 2020-01-09 ENCOUNTER — Encounter: Payer: Self-pay | Admitting: Podiatry

## 2020-01-09 ENCOUNTER — Encounter: Payer: Medicare Other | Admitting: Podiatry

## 2020-01-09 DIAGNOSIS — T8484XA Pain due to internal orthopedic prosthetic devices, implants and grafts, initial encounter: Secondary | ICD-10-CM

## 2020-01-09 DIAGNOSIS — Z9889 Other specified postprocedural states: Secondary | ICD-10-CM

## 2020-01-09 NOTE — Progress Notes (Signed)
OPERATIVE REPORT Patient name: [name] MRN: [id number] DOB: 12/08/1960  DOS:  01/09/20  Preop Dx: symptomatic orthopedic screws x 2 left foot Postop Dx: same  Procedure:  1. Removal of orthopedic screw right 2nd digit 2. Removal of orthopedic screw bilateral 4th digits  Surgeon: Edrick Kins DPM  Anesthesia: 50-50 mixture of 2% lidocaine plain with 0.5% Marcaine plain totaling 93mL infiltrated in the patient's right second and fourth digits bilateral lower extremity  Hemostasis: None  EBL: Minimal mL Materials: None Injectables: None Pathology: None  Condition: The patient tolerated the procedure and anesthesia well. No complications noted or reported   Justification for procedure: The patient is a 59 y.o. female who presents today for surgical correction of symptomatic orthopedic screws to the second third digit of the left foot.  Patient has a history of hammertoe repair with internal screw fixation.  The screws ended up backing out and causing pain.. All conservative modalities of been unsuccessful in providing any sort of satisfactory alleviation of symptoms with the patient. The patient was told benefits as well as possible side effects of the surgery. The patient consented for surgical correction. The patient consent form was reviewed. All patient questions were answered. No guarantees were expressed or implied. The patient and the surgeon boson the patient consent form with the witness present and placed in the patient's chart.   Procedure in Detail: The patient was brought to the procedure room, placed in the procedure chair in the supine position at which time an aseptic scrub and drape were performed about the patient's respective lower extremity after anesthesia was induced as described above. Attention was then directed to the surgical area where procedure number one commenced.  Procedure #1: Removal orthopedic screw fourth digit bilateral foot A 0.5 cm skin  incision was planned and made over the distal tuft of the bilateral fourth digits.  Incision was carried down to the level of bone and orthopedic screw which was identified.  The screw was identified and removed in toto.  Irrigation was utilized and primary closure was obtained using 4-0 nylon suture.  Procedure #2: Removal orthopedic screw second digit right foot The orthopedic screw to the second digit right foot was actually exposed just lateral to the nail plate.  The head of the screw was identified and the screwdriver was utilized to remove the screw completely.  No primary closure was warranted as there was only a small screw hole which was dressed with dry sterile dressing and cleansed with Betadine  Dry sterile compressive dressings were then applied to all previously mentioned incision sites about the patient's lower extremity. The tourniquet which was used for hemostasis was deflated. All normal neurovascular responses including pink color and warmth returned all the digits of patient's lower extremity.  The patient was then discharged with a new prescription for doxycycline 100 mg twice daily. Verbal as well as written instructions were provided for the patient regarding wound care. The patient is to keep the dressings clean dry and intact until they are to follow surgeon Dr. Daylene Katayama in the office upon discharge.   Edrick Kins, DPM [facility]  Dr. Edrick Kins, [address]                                        [place],  [address]  Office [phone]  Fax [phone]

## 2020-01-14 ENCOUNTER — Ambulatory Visit: Payer: 59 | Admitting: Endocrinology

## 2020-01-15 DIAGNOSIS — Z6841 Body Mass Index (BMI) 40.0 and over, adult: Secondary | ICD-10-CM | POA: Diagnosis not present

## 2020-01-15 DIAGNOSIS — D860 Sarcoidosis of lung: Secondary | ICD-10-CM | POA: Diagnosis not present

## 2020-01-16 ENCOUNTER — Ambulatory Visit (INDEPENDENT_AMBULATORY_CARE_PROVIDER_SITE_OTHER): Payer: 59 | Admitting: Podiatry

## 2020-01-16 ENCOUNTER — Ambulatory Visit (INDEPENDENT_AMBULATORY_CARE_PROVIDER_SITE_OTHER): Payer: 59

## 2020-01-16 ENCOUNTER — Other Ambulatory Visit: Payer: Self-pay | Admitting: Podiatry

## 2020-01-16 ENCOUNTER — Other Ambulatory Visit: Payer: Self-pay

## 2020-01-16 DIAGNOSIS — T8484XA Pain due to internal orthopedic prosthetic devices, implants and grafts, initial encounter: Secondary | ICD-10-CM

## 2020-01-16 DIAGNOSIS — Z9889 Other specified postprocedural states: Secondary | ICD-10-CM

## 2020-01-18 ENCOUNTER — Telehealth: Payer: Self-pay | Admitting: Family Medicine

## 2020-01-18 NOTE — Telephone Encounter (Signed)
Chief Complaint Nausea Reason for Call Symptomatic / Request for [place] states his wife had her 2nd covid vaccine yesterday and started feeling nauseous and she took some Zofran but she is starting to feel nausea again today and would like some advice. Translation No Nurse Assessment Guidelines Guideline Title Affirmed Question Affirmed Notes Nurse Date/Time Eilene Ghazi Time) Nausea Unexplained nausea Radford Pax, RN, Eugene Garnet 01/17/2020 6:38:40 PM Disp. Time Eilene Ghazi Time) Disposition Final User 01/17/2020 6:41:11 PM Home Care Yes Radford Pax, RN, Eugene Garnet Caller Disagree/Comply Comply Caller Understands Yes PreDisposition Call Doctor Care Advice Given Per Guideline HOME CARE: * You should be able to treat this at home. REASSURANCE AND EDUCATION: * Nausea is often caused by a stomach virus or indigestion (e.g., from overeating, alcohol). Nausea usually passes in 1 or 2 days. * If nausea is your only symptom, it's usually not caused by anything serious. CLEAR FLUIDS: * Take clear fluids in small amounts until the nausea is resolved for 8 hours: * Sip water or rehydration liquid (Gatorade or Powerade) * Other options: 1/2 strength flat lemon-lime soda or ginger ale. SOLIDS: * Gradually return to a normal diet. * Start with saltine crackers, white bread, rice, mashed potatoes, cereal, apple sauce etc. CALL BACK IF: * You become worse. CARE ADVICE given per Nausea (Adult) guideline. Comments User: Susie Cassette, RN Date/Time Eilene Ghazi Time): 01/17/2020 6:42:41 PMPLEASE NOTE: All timestamps contained within this report are represented as [place] Standard Time. CONFIDENTIALTY NOTICE: This fax transmission is intended only for the addressee. It contains information that is legally privileged, confidential or otherwise protected from use or disclosure. If you are not the intended recipient, you are strictly prohibited from reviewing, disclosing, copying using or disseminating any  of this information or taking any action in reliance on or regarding this information. If you have received this fax in error, please notify us immediately by telephone so that we can arrange for its return to [place]. Phone: [phone], Toll-Free: [phone], Fax: [phone] Page: 2 of 2 Call Id: [id number] Comments Caller states that pt has been taking previously prescribed Zofran and it helps. User: Susie Cassette, RN Date/Time Eilene Ghazi Time): 01/17/2020 6:42:49 PM Instructed pt that per CDC, nausea is a common side effect from the COVID vaccine and that symptom should subside in 48 hours. Instructed to call back if anything changes.

## 2020-01-18 NOTE — Telephone Encounter (Signed)
Pt scheduled for 01/22/20

## 2020-01-20 NOTE — Progress Notes (Signed)
   Subjective:  Patient presents today status post removal of hardware bilaterally. DOS: 01/09/2020. She states she is doing well. She denies any pain or modifying factors. She has been using the CAM boot, taking Tylenol and elevating the feet for treatment. Patient is here for further evaluation and treatment.    Past Medical History:  Diagnosis Date  . Abnormal SPEP 04/17/2014  . Acute left ankle pain 01/26/2017  . ANEMIA-UNSPECIFIED 09/18/2009  . Anxiety   . Arthritis   . CHF (congestive heart failure) ([place])   . Chronic diastolic heart failure, NYHA class 2 (HCC)    Normal LVEDP by May 2018  . COPD (chronic obstructive pulmonary disease) ([place])   . Depression   . DIABETES MELLITUS, TYPE II 08/21/2006  . Diabetic osteomyelitis ([place]) 05/29/2015  . Fracture of 5th metatarsal    non union  . GERD [id number]   Had fundoplication  . GOUT 08/20/2010  . Hammer toe of right foot    2-5th toes  . Hx of umbilical hernia repair   . HYPERLIPIDEMIA 08/21/2006  . HYPERTENSION 08/21/2006  . Infection of wound due to methicillin resistant Staphylococcus aureus (MRSA)   . Internal hemorrhoids   . Multiple allergies 10/14/2016  . OBESITY 06/04/2009  . Onychomycosis 10/27/2015  . Osteomyelitis of left foot ([place]) 05/29/2015  . Pulmonary sarcoidosis ([place])    Followed locally by pulmonology, but also by Dr. Casper Harrison at [facility]  . Right knee pain 01/26/2017  . Vocal cord dysfunction   . Wears partial dentures       Objective/Physical Exam Neurovascular status intact.  Skin incisions appear to be well coapted with sutures and staples intact. No sign of infectious process noted. No dehiscence. No active bleeding noted. Moderate edema noted to the surgical extremity.  Radiographic Exam:  Orthopedic hammertoe screws removed digits 2-5 bilaterally.  Assessment: 1. s/p ROH bilateral. DOS: 01/09/2020   Plan of Care:  1. Patient was evaluated. X-rays reviewed 2. Suture removed.  3.  Recommended good shoe gear.  4. May resume full activity with no restrictions.  5. Return to clinic as needed.    Edrick Kins, DPM [facility]  Dr. Edrick Kins, [address]                                        [place], [place] [address]                Office [phone]  Fax [phone]

## 2020-01-21 NOTE — Progress Notes (Signed)
Phone [phone] In person visit   Subjective:   [name] is a 59 y.o. year old very pleasant female patient who presents for/with See problem oriented charting Chief Complaint  Patient presents with  . Follow-up  . Diabetes  . Hypertension  . Hyperlipidemia   This visit occurred during the SARS-CoV-2 public health emergency.  Safety protocols were in place, including screening questions prior to the visit, additional usage of staff PPE, and extensive cleaning of exam room while observing appropriate contact time as indicated for disinfecting solutions.   Past Medical History-  Patient Active Problem List   Diagnosis Date Noted  . Severe persistent asthma 01/04/2019    Priority: High  . Subcortical microvascular ischemic occlusive disease 07/12/2018    Priority: High  . Obstructive chronic bronchitis without exacerbation ([place]) 09/18/2013    Priority: High  . Chest pain 04/11/2013    Priority: High  . Hypertensive heart disease with chronic diastolic congestive heart failure ([place])     Priority: High  . Sarcoidosis of lung ([place]) 04/10/2007    Priority: High  . Type II diabetes mellitus with renal manifestations ([place]) 08/21/2006    Priority: High  . Constipation 04/21/2018    Priority: Medium  . Epiglottitis     Priority: Medium  . Osteoarthritis of right knee 01/26/2017    Priority: Medium  . CKD (chronic kidney disease) stage 3, GFR 30-59 ml/min 04/27/2015    Priority: Medium  . History of osteomyelitis 03/27/2015    Priority: Medium  . Fatty liver 09/30/2014    Priority: Medium  . Major depression in full remission ([place])     Priority: Medium  . Gout 08/20/2010    Priority: Medium  . Anemia 09/18/2009    Priority: Medium  . Sleep apnea 04/21/2009    Priority: Medium  . Hyperlipidemia associated with type 2 diabetes mellitus ([place]) 08/21/2006    Priority: Medium  . Hypertension associated with diabetes ([place]) 08/21/2006    Priority: Medium  .  Nontraumatic complete tear of left rotator cuff     Priority: Low  . Diabetic neuropathy ([place]) 02/20/2019    Priority: Low  . Rapid palpitations 07/12/2018    Priority: Low  . Impingement of left ankle joint 06/26/2018    Priority: Low  . Total knee replacement status, left 12/07/2017    Priority: Low  . Dysphagia 09/20/2017    Priority: Low  . Plantar fasciitis, right 07/13/2017    Priority: Low  . S/P total knee arthroplasty, right 06/29/2017    Priority: Low  . Osteoarthrosis, localized, primary, knee, right     Priority: Low  . Sprain of calcaneofibular ligament of right ankle 06/06/2017    Priority: Low  . Onychomycosis 10/27/2015    Priority: Low  . MRSA (methicillin resistant staph aureus) culture positive 03/27/2015    Priority: Low  . Hot flashes 07/15/2014    Priority: Low  . Abnormal SPEP 04/17/2014    Priority: Low  . Fracture of left leg 04/17/2014    Priority: Low  . Cushingoid side effect of steroids ([place]) 04/17/2014    Priority: Low  . Internal hemorrhoids     Priority: Low  . Preoperative clearance 03/25/2014    Priority: Low  . Solitary pulmonary nodule, on CT 02/2013 - stable over 2 years in 2015 02/20/2013    Priority: Low  . Morbid obesity ([place]) 06/04/2009    Priority: Low  . GERD 08/21/2006    Priority: Low    Medications-  reviewed and updated Current Outpatient Medications  Medication Sig Dispense Refill  . albuterol (PROVENTIL HFA;VENTOLIN HFA) 108 (90 Base) MCG/ACT inhaler Inhale 1-2 puffs into the lungs every 6 (six) hours as needed for wheezing or shortness of breath. 8 g 5  . allopurinol (ZYLOPRIM) 100 MG tablet Take 100 mg by mouth every evening.     [name] aspirin EC 81 MG tablet Take 81 mg by mouth daily.     [name] atorvastatin (LIPITOR) 40 MG tablet TAKE 1 TABLET BY MOUTH EVERY DAY 90 tablet 3  . BREO ELLIPTA 200-25 MCG/INH AEPB TAKE 1 PUFF BY MOUTH EVERY DAY 60 each 5  . buPROPion (WELLBUTRIN XL) 300 MG 24 hr tablet TAKE 1 TABLET BY MOUTH  EVERY DAY 90 tablet 3  . carvedilol (COREG) 25 MG tablet TAKE 1 TABLET (25 MG TOTAL) BY MOUTH 2 (TWO) TIMES DAILY WITH A MEAL. 180 tablet 3  . chlorpheniramine-HYDROcodone (TUSSIONEX PENNKINETIC ER) 10-8 MG/5ML SUER Take 5 mLs by mouth 2 (two) times daily. 140 mL 0  . cholecalciferol (VITAMIN D) 25 MCG (1000 UT) tablet Take 1,000 Units by mouth 2 (two) times a day.    [name] dexlansoprazole (DEXILANT) 60 MG capsule Take 1 capsule (60 mg total) by mouth daily. 30 capsule 5  . diclofenac Sodium (VOLTAREN) 1 % GEL Apply 1 application topically 4 (four) times daily as needed (pain.).    [name] diltiazem (CARDIZEM CD) 120 MG 24 hr capsule TAKE 1 CAPSULE BY MOUTH EVERY DAY 90 capsule 0  . doxycycline (VIBRA-TABS) 100 MG tablet Take 1 tablet (100 mg total) by mouth 2 (two) times daily. 20 tablet 0  . Dulaglutide (TRULICITY) [id number] 0000000 SOPN Inject 0.75 mg into the skin once a week. 4 pen 11  . famotidine (PEPCID) 20 MG tablet Take 1 tablet (20 mg total) by mouth at bedtime. 30 tablet 3  . fenofibrate 160 MG tablet TAKE 1 TABLET BY MOUTH EVERY DAY 90 tablet 1  . fluticasone (FLONASE) 50 MCG/ACT nasal spray Place 2 sprays into both nostrils daily as needed for allergies. 48 mL 3  . furosemide (LASIX) 40 MG tablet TAKE 1 TABLET BY MOUTH TWICE A DAY 180 tablet 3  . gabapentin (NEURONTIN) 100 MG capsule TAKE 1 CAPSULE BY MOUTH THREE TIMES A DAY 270 capsule 3  . glucose blood (CONTOUR NEXT TEST) test strip 1 each by Other route 4 (four) times daily. And lancets 4/day 400 each 3  . icosapent Ethyl (VASCEPA) 1 g capsule Take 2 capsules (2 g total) by mouth 2 (two) times daily. 120 capsule 11  . Insulin Glargine (LANTUS SOLOSTAR) 100 UNIT/ML Solostar Pen Inject 50 Units into the skin every morning. 18 mL 2  . insulin lispro (HUMALOG KWIKPEN) 100 UNIT/ML KwikPen Inject 0.25 mLs (25 Units total) into the skin daily with supper. 15 mL   . Insulin Pen Needle 33G X 4 MM MISC 1 each by Does not apply route 4 (four) times daily.  200 each 11  . isosorbide mononitrate (IMDUR) 30 MG 24 hr tablet TAKE 1 TABLET (30 MG TOTAL) BY MOUTH AT BEDTIME. 90 tablet 1  . KLOR-CON M20 20 MEQ tablet TAKE 1 & 1/2 TABLETS BY MOUTH TWICE A DAY 270 tablet 2  . lactulose (CHRONULAC) 10 GM/15ML solution Take 10 g by mouth daily as needed (constipation).     [name] linaclotide (LINZESS) 290 MCG CAPS capsule Take 1 capsule (290 mcg total) by mouth daily before breakfast. 30 capsule 11  . LORazepam (ATIVAN)  0.5 MG tablet Take 0.5 mg by mouth every 12 (twelve) hours as needed for anxiety.    . metFORMIN (GLUCOPHAGE) 1000 MG tablet TAKE 1 TABLET BY MOUTH 2 TIMES DAILY WITH A MEAL. MUST CALL TO SCHEDULE AN APPT. 180 tablet 1  . Microlet Lancets MISC 1 each by Does not apply route 2 (two) times daily. E11.9 100 each 2  . nitroGLYCERIN (NITROSTAT) 0.4 MG SL tablet Place 1 tablet (0.4 mg total) under the tongue every 5 (five) minutes as needed for chest pain. Reported on 01/05/2016 25 tablet 6  . ondansetron (ZOFRAN-ODT) 4 MG disintegrating tablet Take 1 tablet (4 mg total) by mouth every 8 (eight) hours as needed for nausea or vomiting. 50 tablet 2  . oxyCODONE-acetaminophen (PERCOCET) 5-325 MG tablet Take 1 tablet by mouth every 6 (six) hours as needed for severe pain. 30 tablet 0  . PARoxetine Mesylate 7.5 MG CAPS Take 7.5 mg by mouth daily. 90 capsule 3  . predniSONE (DELTASONE) 5 MG tablet Take 5 mg by mouth daily with breakfast.    . telmisartan (MICARDIS) 20 MG tablet TAKE 1 TABLET BY MOUTH EVERY DAY 90 tablet 1  . triamcinolone cream (KENALOG) 0.1 % Apply 1 application topically 2 (two) times daily as needed (rash/irritation.).    [name] venlafaxine XR (EFFEXOR-XR) 75 MG 24 hr capsule TAKE 1 CAPSULE BY MOUTH TWICE A DAY 180 capsule 3   No current facility-administered medications for this visit.     Objective:  BP 110/60   Pulse 73   Temp 97.6 F (36.4 C)   Ht 5\' 6"  (1.676 m)   Wt 250 lb 9.6 oz (113.7 kg)   SpO2 98%   BMI 40.45 kg/m  Gen: NAD,  resting comfortably Pale swollen turbinates with clear discharge only.  CV: RRR no murmurs rubs or gallops Lungs: CTAB no crackles, wheeze, rhonchi Abdomen: soft/nontender including area of reported painds/nondistended/normal bowel sounds. No rebound or guarding.  Ext: no edema Skin: warm, dry    Assessment and Plan.   # Upper abdominal pain in patient with history GERD S:patient compliant with dexilant but still getting upper abdominal pain- from above belly button to epigastric area. Going on for 3 weeks. Pain at its worst can be 6-7/10. Seems to come and go for about 5 minutes twice a day- appetite has been lower after covid shot. Pain was before covid shot but low appetite has been afterwards. Some nausea. No vomiting.   A/P: intermittent at least moderate level abdominal pain for 3 weeks comes on about twice a day for 5 minutes- she is already on good dose of dexilant. Does not seem to be associated with meals.  Good regular BMs and no thaving to take linzess- does not sound like constipation. We will check some basic labs including lipase. She will let me know if symptoms fail to improve over next 2-3 weeks or if they worsen and we can order ct abdomen/pelvis for more information- she would like to hold off for now given low frequency of issues  # allergic rhinitis S:has been running low on flonase and pollen has been bad. She is using just 1 spray and needs refill on flonase. Having some headaches with increased congestion going on for about a week. Not much discharge.   A/P: day 7 of illness at least. From nasal exam- has pale swollen turbinates concerning for allergy flare. She is going to restart flonase full 2 sprays each nostril for next few days- if not  improving by next Monday or symptoms worsen I can call in augmentin for her    # Depression/hot flashes S: Compliant with Wellbutrin 300 xr, and Effexor xr 75mg , and paxil 7.5mg  (for hot flashes).  A/P: phq9 at 0- in full remission  of depression and hot flashes reasonable controlled- continue current meds  # hypertension/hypertensive heart disease with chronic diastolic CHF/CKD stage III S: compliant with Lasix 40 mg twice a day, diltiazem extended release 120 mg, carvedilol 25 mg BID, telmisartan 20mg  daily, imdur 30mg   Renal function stable with GFR of low 60s recently (so actually better) A/P: good control on complex regimen. Last kidney tets actually looking better- if persists in this range would change to stage II kidney disease at next visit.    # hyperlipidemia S:compliant with atorvastatin 40 mg, fenofibrate 160mg .  Despite this triglycerides remain high and she was started on Vascepa by cardiology   she also takes aspirin for primary prevention A/P: has not had lipids checked since had vascepa added and we will order that now- 2 chicken minis hopefully triglycerides ok with this and addition of vascepa   Sarcoidosis of lung- follows at [facility] Dr. Hendricks Milo is on long term prednisone/Obstructive chronic bronchitis without exacerbation /Severe persistent asthma without complication S:  patient compliant with breo 200-25mg  1 puff every day. Aluterol usage about twice a month.   Can get into some tough coughing spells when ill and requests tussionex generally at those times. Also uses flonase for allergies A/P: appears to be stable- just saw [facility] last week and got a good report. We will continue current meds  #Type 2 diabetes mellitus/morbid obesity #Diabetic polyneuropathy associated with type 2 diabetes mellitus #fatty liver - patient compliant with gabapentin 100mg  3x a day for neuropathy. Status stable. Plan continue current meds - follows with endocrinology and on insulin. Chronic steroids contribute to sugar dysregulation  - for obesity BMI >40 and fatty liver  and Encouraged need for healthy eating, regular exercise, weight loss. Feels like she can increase her walking and cut back on some of her unhealthy  foods -LFTs have been norma- will continue to trend particularly with some epigastric pain at this time Lab Results  Component Value Date   ALT 22 07/04/2019   AST 24 07/04/2019   ALKPHOS 85 07/04/2019   BILITOT 0.6 07/04/2019   #Gout # arthritic issues- s/p left total knee replacement. ongoign right knee OA S: 0 flares in last 6 months on allopurinol 100mg  Lab Results  Component Value Date   LABURIC 5.6 07/04/2019  A/P:good control- continue current meds. Knees have been stable without meds   # Cushingoid side effect of steroids- noted. Continue to monitor. [place] hump and supraclavicular fullness noted.   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 Noted - trend cbc. Has seen oncology in past for positive SPEP but ultimately thought related to sarcoid. Wbc may be high with long term steroids as well  Recommended follow up: Return in about 4 months (around 05/23/2020) for physical or sooner if needed.  Lab/Order associations:   ICD-10-CM   1. Sarcoidosis of lung ([place])  D86.0   2. Obstructive chronic bronchitis without exacerbation ([place])  J44.9   3. Type 2 diabetes mellitus with other diabetic kidney complication, with long-term current use of insulin ([facility])  E11.29 Lipid panel   Z79.4   4. Hypertensive heart disease with chronic diastolic congestive heart failure ([facility])  I11.0 CBC with Differential/Platelet   I50.32 Comprehensive metabolic  panel  5. Hypertension associated with diabetes ([place])  E11.59 CBC with Differential/Platelet   I10 Comprehensive metabolic panel  6. Hyperlipidemia associated with type 2 diabetes mellitus ([facility])  E11.69 CBC with Differential/Platelet   E78.5 Comprehensive metabolic panel    Lipid panel  7. Recurrent major depressive disorder, in full remission ([place])  F33.42   8. Morbid obesity ([place])  E66.01   9. Diabetic polyneuropathy associated with type 2 diabetes mellitus ([facility])  E11.42   10. Cushingoid side effect  of steroids ([facility])  E24.2   11. Severe persistent asthma without complication  [id number]   12. 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 Other iron deficiency anemia  D50.8   14. Epigastric abdominal pain  R10.13 CBC with Differential/Platelet    Comprehensive metabolic panel    Lipase   Meds ordered this encounter  Medications  . fluticasone (FLONASE) 50 MCG/ACT nasal spray    Sig: Place 2 sprays into both nostrils daily as needed for allergies.    Dispense:  48 mL    Refill:  3   Return precautions advised.  Garret Reddish, MD

## 2020-01-22 ENCOUNTER — Ambulatory Visit (INDEPENDENT_AMBULATORY_CARE_PROVIDER_SITE_OTHER): Payer: 59 | Admitting: Family Medicine

## 2020-01-22 ENCOUNTER — Other Ambulatory Visit: Payer: Self-pay

## 2020-01-22 ENCOUNTER — Encounter: Payer: Self-pay | Admitting: Family Medicine

## 2020-01-22 VITALS — BP 110/60 | HR 73 | Temp 97.6°F | Ht 66.0 in | Wt 250.6 lb

## 2020-01-22 DIAGNOSIS — E1169 Type 2 diabetes mellitus with other specified complication: Secondary | ICD-10-CM

## 2020-01-22 DIAGNOSIS — J449 Chronic obstructive pulmonary disease, unspecified: Secondary | ICD-10-CM

## 2020-01-22 DIAGNOSIS — D508 Other iron deficiency anemias: Secondary | ICD-10-CM

## 2020-01-22 DIAGNOSIS — E1129 Type 2 diabetes mellitus with other diabetic kidney complication: Secondary | ICD-10-CM | POA: Diagnosis not present

## 2020-01-22 DIAGNOSIS — E785 Hyperlipidemia, unspecified: Secondary | ICD-10-CM

## 2020-01-22 DIAGNOSIS — I5032 Chronic diastolic (congestive) heart failure: Secondary | ICD-10-CM

## 2020-01-22 DIAGNOSIS — R1013 Epigastric pain: Secondary | ICD-10-CM | POA: Diagnosis not present

## 2020-01-22 DIAGNOSIS — F3342 Major depressive disorder, recurrent, in full remission: Secondary | ICD-10-CM

## 2020-01-22 DIAGNOSIS — E242 Drug-induced Cushing's syndrome: Secondary | ICD-10-CM

## 2020-01-22 DIAGNOSIS — D860 Sarcoidosis of lung: Secondary | ICD-10-CM | POA: Diagnosis not present

## 2020-01-22 DIAGNOSIS — J4550 Severe persistent asthma, uncomplicated: Secondary | ICD-10-CM

## 2020-01-22 DIAGNOSIS — I110 Hypertensive heart disease with heart failure: Secondary | ICD-10-CM | POA: Diagnosis not present

## 2020-01-22 DIAGNOSIS — Z794 Long term (current) use of insulin: Secondary | ICD-10-CM

## 2020-01-22 DIAGNOSIS — E1159 Type 2 diabetes mellitus with other circulatory complications: Secondary | ICD-10-CM | POA: Diagnosis not present

## 2020-01-22 DIAGNOSIS — E1142 Type 2 diabetes mellitus with diabetic polyneuropathy: Secondary | ICD-10-CM

## 2020-01-22 DIAGNOSIS — I10 Essential (primary) hypertension: Secondary | ICD-10-CM

## 2020-01-22 DIAGNOSIS — I152 Hypertension secondary to endocrine disorders: Secondary | ICD-10-CM

## 2020-01-22 DIAGNOSIS — M1A4720 Other secondary chronic gout, left ankle and foot, without tophus (tophi): Secondary | ICD-10-CM

## 2020-01-22 LAB — CBC WITH DIFFERENTIAL/PLATELET
Basophils Absolute: 0.1 10*3/uL (ref 0.0–0.1)
Basophils Relative: 1.1 % (ref 0.0–3.0)
Eosinophils Absolute: 0.3 10*3/uL (ref 0.0–0.7)
Eosinophils Relative: 2.6 % (ref 0.0–5.0)
HCT: 30 % — ABNORMAL LOW (ref 36.0–46.0)
Hemoglobin: 9.9 g/dL — ABNORMAL LOW (ref 12.0–15.0)
Lymphocytes Relative: 26.1 % (ref 12.0–46.0)
Lymphs Abs: 2.6 10*3/uL (ref 0.7–4.0)
MCHC: 33.1 g/dL (ref 30.0–36.0)
MCV: 85.5 fl (ref 78.0–100.0)
Monocytes Absolute: 0.7 10*3/uL (ref 0.1–1.0)
Monocytes Relative: 6.6 % (ref 3.0–12.0)
Neutro Abs: 6.5 10*3/uL (ref 1.4–7.7)
Neutrophils Relative %: 63.6 % (ref 43.0–77.0)
Platelets: 335 10*3/uL (ref 150.0–400.0)
RBC: 3.5 Mil/uL — ABNORMAL LOW (ref 3.87–5.11)
RDW: 15.4 % (ref 11.5–15.5)
WBC: 10.2 10*3/uL (ref 4.0–10.5)

## 2020-01-22 LAB — COMPREHENSIVE METABOLIC PANEL
ALT: 61 U/L — ABNORMAL HIGH (ref 0–35)
AST: 35 U/L (ref 0–37)
Albumin: 4.1 g/dL (ref 3.5–5.2)
Alkaline Phosphatase: 120 U/L — ABNORMAL HIGH (ref 39–117)
BUN: 17 mg/dL (ref 6–23)
CO2: 33 mEq/L — ABNORMAL HIGH (ref 19–32)
Calcium: 8.9 mg/dL (ref 8.4–10.5)
Chloride: 98 mEq/L (ref 96–112)
Creatinine, Ser: 0.96 mg/dL (ref 0.40–1.20)
GFR: 59.19 mL/min — ABNORMAL LOW (ref >60.00)
Glucose, Bld: 154 mg/dL — ABNORMAL HIGH (ref 70–99)
Potassium: 4.5 mEq/L (ref 3.5–5.1)
Sodium: 139 mEq/L (ref 135–145)
Total Bilirubin: 0.6 mg/dL (ref 0.2–1.2)
Total Protein: 6.3 g/dL (ref 6.0–8.3)

## 2020-01-22 LAB — LIPID PANEL
Cholesterol: 143 mg/dL (ref 0–200)
HDL: 31.6 mg/dL — ABNORMAL LOW (ref >39.00)
NonHDL: 111.03
Total CHOL/HDL Ratio: 5
Triglycerides: 293 mg/dL — ABNORMAL HIGH (ref 0.0–149.0)
VLDL: 58.6 mg/dL — ABNORMAL HIGH (ref 0.0–40.0)

## 2020-01-22 LAB — LIPASE: Lipase: 21 U/L (ref 11.0–59.0)

## 2020-01-22 LAB — LDL CHOLESTEROL, DIRECT: Direct LDL: 68 mg/dL

## 2020-01-22 MED ORDER — FLUTICASONE PROPIONATE 50 MCG/ACT NA SUSP: 2.0000 | Freq: Every day | NASAL | 3 refills | Status: DC | PRN

## 2020-01-22 NOTE — Patient Instructions (Addendum)
Health Maintenance Due  Topic Date Due  . OPHTHALMOLOGY EXAM- 12/2019 records are being snet  11/24/2019   From nasal exam- has pale swollen turbinates concerning for allergy flare. She is going to restart flonase full 2 sprays each nostril for next few days- if not improving by next Monday or symptoms worsen I can call in augmentin for her  If pain in abdomen worsens or fails to improve in 2-3 weeks let us know- we can consider CT scan of abdomen and pelvis  Please stop by lab before you go If you do not have mychart- we will call you about results within 5 business days of [place] receiving them.  If you have mychart- we will send your results within 3 business days of [place] receiving them.  If abnormal or we want to clarify a result, we will call or mychart you to make sure you receive the message.  If you have questions or concerns or don't hear within 5 business days, please send [place] a message or call us.

## 2020-01-23 ENCOUNTER — Other Ambulatory Visit: Payer: Self-pay

## 2020-01-23 ENCOUNTER — Other Ambulatory Visit: Payer: Self-pay | Admitting: Family Medicine

## 2020-01-23 DIAGNOSIS — R1013 Epigastric pain: Secondary | ICD-10-CM

## 2020-01-24 ENCOUNTER — Ambulatory Visit: Payer: 59 | Admitting: Endocrinology

## 2020-01-28 ENCOUNTER — Telehealth: Payer: Self-pay | Admitting: Family Medicine

## 2020-01-28 ENCOUNTER — Encounter: Payer: 59 | Admitting: Podiatry

## 2020-01-28 DIAGNOSIS — G4730 Sleep apnea, unspecified: Secondary | ICD-10-CM

## 2020-01-28 NOTE — Telephone Encounter (Signed)
Patients husband sent a message that his wife needed to be set up with a new CPAP machine if she needs a new one.

## 2020-01-28 NOTE — Telephone Encounter (Signed)
Called patient she had sleep study over 10 years ago. Would like new machine. Uses advanced. Do we need new sleep study or ok to send new order?

## 2020-01-28 NOTE — Telephone Encounter (Signed)
I would do a referral to pulmonology to get their opinion on if she needs repeat study and to help coordinate new machine

## 2020-01-29 ENCOUNTER — Other Ambulatory Visit: Payer: Self-pay

## 2020-01-29 NOTE — Addendum Note (Signed)
Addended by: Clyde Lundborg A on: 01/29/2020 11:57 AM   Modules accepted: Orders

## 2020-01-29 NOTE — Telephone Encounter (Signed)
Referral to pulmonology placed.

## 2020-01-31 ENCOUNTER — Ambulatory Visit: Payer: 59 | Admitting: Endocrinology

## 2020-01-31 ENCOUNTER — Ambulatory Visit
Admission: RE | Admit: 2020-01-31 | Discharge: 2020-01-31 | Disposition: A | Discharge status: Home / Self Care | Payer: 59 | Source: Ambulatory Visit | Attending: Family Medicine | Admitting: Family Medicine

## 2020-01-31 ENCOUNTER — Other Ambulatory Visit: Payer: Self-pay

## 2020-01-31 DIAGNOSIS — E1129 Type 2 diabetes mellitus with other diabetic kidney complication: Secondary | ICD-10-CM

## 2020-01-31 DIAGNOSIS — R1013 Epigastric pain: Secondary | ICD-10-CM

## 2020-02-01 ENCOUNTER — Ambulatory Visit (INDEPENDENT_AMBULATORY_CARE_PROVIDER_SITE_OTHER): Payer: 59

## 2020-02-01 DIAGNOSIS — Z0000 Encounter for general adult medical examination without abnormal findings: Secondary | ICD-10-CM | POA: Diagnosis not present

## 2020-02-01 NOTE — Patient Instructions (Signed)
[name] , Thank you for taking time to come for your Medicare Wellness Visit. I appreciate your ongoing commitment to your health goals. Please review the following plan we discussed and let me know if I can assist you in the future.   Screening recommendations/referrals: Colorectal Screening: up to date; last colonoscopy 04/29/10 Mammogram: up to date; last 05/28/19 Bone Density: up to date; last 11/01/19   Vision and Dental Exams: Recommended annual ophthalmology exams for early detection of glaucoma and other disorders of the eye Recommended annual dental exams for proper oral hygiene  Diabetic Exams: Diabetic Eye Exam: recommended yearly; we will request last records  Diabetic Foot Exam: recommended yearly; up to date  Vaccinations: Influenza vaccine: completed 07/26/19 Pneumococcal vaccine: up to date; last 05/06/14 Tdap vaccine: up to date; last 05/21/10   Shingles vaccine: Shingrix completed  Covid vaccine:  Completed   Advanced directives: Please bring a copy of your POA (Power of [place]) and/or Living Will to your next appointment.  Goals: Recommend to drink at least 6-8 8oz glasses of water per day and consume a balanced diet rich in fresh fruits and vegetables.   Next appointment: Please schedule your Annual Wellness Visit with your Nurse Health Advisor in one year.  Preventive Care 40-64 Years, Female Preventive care refers to lifestyle choices and visits with your health care provider that can promote health and wellness. What does preventive care include?  A yearly physical exam. This is also called an annual well check.  Dental exams once or twice a year.  Routine eye exams. Ask your health care provider how often you should have your eyes checked.  Personal lifestyle choices, including:  Daily care of your teeth and gums.  Regular physical activity.  Eating a healthy diet.  Avoiding tobacco and drug use.  Limiting alcohol use.  Practicing safe  sex.  Taking low-dose aspirin daily starting at age 53 if recommended by your health care provider.  Taking vitamin and mineral supplements as recommended by your health care provider. What happens during an annual well check? The services and screenings done by your health care provider during your annual well check will depend on your age, overall health, lifestyle risk factors, and family history of disease. Counseling  Your health care provider may ask you questions about your:  Alcohol use.  Tobacco use.  Drug use.  Emotional well-being.  Home and relationship well-being.  Sexual activity.  Eating habits.  Work and work Statistician.  Method of birth control.  Menstrual cycle.  Pregnancy history. Screening  You may have the following tests or measurements:  Height, weight, and BMI.  Blood pressure.  Lipid and cholesterol levels. These may be checked every 5 years, or more frequently if you are over 35 years old.  Skin check.  Lung cancer screening. You may have this screening every year starting at age 74 if you have a 30-pack-year history of smoking and currently smoke or have quit within the past 15 years.  Fecal occult blood test (FOBT) of the stool. You may have this test every year starting at age 66.  Flexible sigmoidoscopy or colonoscopy. You may have a sigmoidoscopy every 5 years or a colonoscopy every 10 years starting at age 56.  Hepatitis C blood test.  Hepatitis B blood test.  Sexually transmitted disease (STD) testing.  Diabetes screening. This is done by checking your blood sugar (glucose) after you have not eaten for a while (fasting). You may have this done every 1-3  years.  Mammogram. This may be done every 1-2 years. Talk to your health care provider about when you should start having regular mammograms. This may depend on whether you have a family history of breast cancer.  BRCA-related cancer screening. This may be done if you have a  family history of breast, ovarian, tubal, or peritoneal cancers.  Pelvic exam and Pap test. This may be done every 3 years starting at age 71. Starting at age 26, this may be done every 5 years if you have a Pap test in combination with an HPV test.  Bone density scan. This is done to screen for osteoporosis. You may have this scan if you are at high risk for osteoporosis. Discuss your test results, treatment options, and if necessary, the need for more tests with your health care provider. Vaccines  Your health care provider may recommend certain vaccines, such as:  Influenza vaccine. This is recommended every year.  Tetanus, diphtheria, and acellular pertussis (Tdap, Td) vaccine. You may need a Td booster every 10 years.  Zoster vaccine. You may need this after age 45.  Pneumococcal 13-valent conjugate (PCV13) vaccine. You may need this if you have certain conditions and were not previously vaccinated.  Pneumococcal polysaccharide (PPSV23) vaccine. You may need one or two doses if you smoke cigarettes or if you have certain conditions. Talk to your health care provider about which screenings and vaccines you need and how often you need them. This information is not intended to replace advice given to you by your health care provider. Make sure you discuss any questions you have with your health care provider. Document Released: 10/24/2015 Document Revised: 06/16/2016 Document Reviewed: 07/29/2015 Elsevier Interactive Patient Education  2017 [place] Prevention in the Home Falls can cause injuries. They can happen to people of all ages. There are many things you can do to make your home safe and to help prevent falls. What can I do on the outside of my home?  Regularly fix the edges of walkways and driveways and fix any cracks.  Remove anything that might make you trip as you walk through a door, such as a raised step or threshold.  Trim any bushes or trees on the path  to your home.  Use bright outdoor lighting.  Clear any walking paths of anything that might make someone trip, such as rocks or tools.  Regularly check to see if handrails are loose or broken. Make sure that both sides of any steps have handrails.  Any raised decks and porches should have guardrails on the edges.  Have any leaves, snow, or ice cleared regularly.  Use sand or salt on walking paths during winter.  Clean up any spills in your garage right away. This includes oil or grease spills. What can I do in the bathroom?  Use night lights.  Install grab bars by the toilet and in the tub and shower. Do not use towel bars as grab bars.  Use non-skid mats or decals in the tub or shower.  If you need to sit down in the shower, use a plastic, non-slip stool.  Keep the floor dry. Clean up any water that spills on the floor as soon as it happens.  Remove soap buildup in the tub or shower regularly.  Attach bath mats securely with double-sided non-slip rug tape.  Do not have throw rugs and other things on the floor that can make you trip. What can I do in  the bedroom?  Use night lights.  Make sure that you have a light by your bed that is easy to reach.  Do not use any sheets or blankets that are too big for your bed. They should not hang down onto the floor.  Have a firm chair that has side arms. You can use this for support while you get dressed.  Do not have throw rugs and other things on the floor that can make you trip. What can I do in the kitchen?  Clean up any spills right away.  Avoid walking on wet floors.  Keep items that you use a lot in easy-to-reach places.  If you need to reach something above you, use a strong step stool that has a grab bar.  Keep electrical cords out of the way.  Do not use floor polish or wax that makes floors slippery. If you must use wax, use non-skid floor wax.  Do not have throw rugs and other things on the floor that can make  you trip. What can I do with my stairs?  Do not leave any items on the stairs.  Make sure that there are handrails on both sides of the stairs and use them. Fix handrails that are broken or loose. Make sure that handrails are as long as the stairways.  Check any carpeting to make sure that it is firmly attached to the stairs. Fix any carpet that is loose or worn.  Avoid having throw rugs at the top or bottom of the stairs. If you do have throw rugs, attach them to the floor with carpet tape.  Make sure that you have a light switch at the top of the stairs and the bottom of the stairs. If you do not have them, ask someone to add them for you. What else can I do to help prevent falls?  Wear shoes that:  Do not have high heels.  Have rubber bottoms.  Are comfortable and fit you well.  Are closed at the toe. Do not wear sandals.  If you use a stepladder:  Make sure that it is fully opened. Do not climb a closed stepladder.  Make sure that both sides of the stepladder are locked into place.  Ask someone to hold it for you, if possible.  Clearly mark and make sure that you can see:  Any grab bars or handrails.  First and last steps.  Where the edge of each step is.  Use tools that help you move around (mobility aids) if they are needed. These include:  Canes.  Walkers.  Scooters.  Crutches.  Turn on the lights when you go into a dark area. Replace any light bulbs as soon as they burn out.  Set up your furniture so you have a clear path. Avoid moving your furniture around.  If any of your floors are uneven, fix them.  If there are any pets around you, be aware of where they are.  Review your medicines with your doctor. Some medicines can make you feel dizzy. This can increase your chance of falling. Ask your doctor what other things that you can do to help prevent falls. This information is not intended to replace advice given to you by your health care provider.  Make sure you discuss any questions you have with your health care provider. Document Released: 07/24/2009 Document Revised: 03/04/2016 Document Reviewed: 11/01/2014 Elsevier Interactive Patient Education  2017 [employer].

## 2020-02-01 NOTE — Progress Notes (Signed)
This visit is being conducted via phone call due to the COVID-19 pandemic. This patient has given me verbal consent via phone to conduct this visit, patient states they are participating from their home address. Some vital signs may be absent or patient reported.   Patient identification: identified by name, DOB, and current address.  Location provider: [facility], Office Persons participating in the virtual visit: Denman George LPN, patient, and Dr. Garret Reddish    Subjective:   [name] is a 59 y.o. female who presents for an Initial Medicare Annual Wellness Visit.  Review of Systems     Cardiac Risk Factors include: advanced age (>27men, >58 women);dyslipidemia;diabetes mellitus;hypertension;sedentary lifestyle    Objective:    There were no vitals filed for this visit. There is no height or weight on file to calculate BMI.  Advanced Directives 02/01/2020 10/18/2019 08/14/2019 07/22/2019 04/10/2019 03/14/2019 03/12/2019  Does Patient Have a Medical Advance Directive? Yes No No No No No No  Type of Advance Directive Living will;Healthcare Power of Attorney - - - - - -  Does patient want to make changes to medical advance directive? No - Patient declined - - - - - -  Copy of [place] in Chart? No - copy requested - - - - - -  Would patient like information on creating a medical advance directive? - No - Patient declined No - Patient declined No - Patient declined No - Patient declined No - Patient declined No - Patient declined  Pre-existing out of facility DNR order (yellow form or pink MOST form) - - - - - - -    Current Medications (verified) Outpatient Encounter Medications as of 02/01/2020  Medication Sig  . albuterol (PROVENTIL HFA;VENTOLIN HFA) 108 (90 Base) MCG/ACT inhaler Inhale 1-2 puffs into the lungs every 6 (six) hours as needed for wheezing or shortness of breath.  . allopurinol (ZYLOPRIM) 100 MG tablet Take 100 mg by mouth every evening.   [name]  aspirin EC 81 MG tablet Take 81 mg by mouth daily.   [name] atorvastatin (LIPITOR) 40 MG tablet TAKE 1 TABLET BY MOUTH EVERY DAY  . BREO ELLIPTA 200-25 MCG/INH AEPB TAKE 1 PUFF BY MOUTH EVERY DAY  . buPROPion (WELLBUTRIN XL) 300 MG 24 hr tablet TAKE 1 TABLET BY MOUTH EVERY DAY  . carvedilol (COREG) 25 MG tablet TAKE 1 TABLET (25 MG TOTAL) BY MOUTH 2 (TWO) TIMES DAILY WITH A MEAL.  . chlorpheniramine-HYDROcodone (TUSSIONEX PENNKINETIC ER) 10-8 MG/5ML SUER Take 5 mLs by mouth 2 (two) times daily.  . cholecalciferol (VITAMIN D) 25 MCG (1000 UT) tablet Take 1,000 Units by mouth 2 (two) times a day.  [name] dexlansoprazole (DEXILANT) 60 MG capsule Take 1 capsule (60 mg total) by mouth daily.  . diclofenac Sodium (VOLTAREN) 1 % GEL Apply 1 application topically 4 (four) times daily as needed (pain.).  [name] diltiazem (CARDIZEM CD) 120 MG 24 hr capsule TAKE 1 CAPSULE BY MOUTH EVERY DAY  . Dulaglutide (TRULICITY) [id number] [id number] SOPN Inject 0.75 mg into the skin once a week.  . famotidine (PEPCID) 20 MG tablet Take 1 tablet (20 mg total) by mouth at bedtime.  . fenofibrate 160 MG tablet TAKE 1 TABLET BY MOUTH EVERY DAY  . fluticasone (FLONASE) 50 MCG/ACT nasal spray Place 2 sprays into both nostrils daily as needed for allergies.  . furosemide (LASIX) 40 MG tablet TAKE 1 TABLET BY MOUTH TWICE A DAY  . gabapentin (NEURONTIN) 100 MG capsule TAKE  1 CAPSULE BY MOUTH THREE TIMES A DAY  . glucose blood (CONTOUR NEXT TEST) test strip 1 each by Other route 4 (four) times daily. And lancets 4/day  . icosapent Ethyl (VASCEPA) 1 g capsule Take 2 capsules (2 g total) by mouth 2 (two) times daily.  . Insulin Glargine (LANTUS SOLOSTAR) 100 UNIT/ML Solostar Pen Inject 50 Units into the skin every morning.  . insulin lispro (HUMALOG KWIKPEN) 100 UNIT/ML KwikPen Inject 0.25 mLs (25 Units total) into the skin daily with supper.  . Insulin Pen Needle 33G X 4 MM MISC 1 each by Does not apply route 4 (four) times daily.  . isosorbide  mononitrate (IMDUR) 30 MG 24 hr tablet TAKE 1 TABLET (30 MG TOTAL) BY MOUTH AT BEDTIME.  [name] KLOR-CON M20 20 MEQ tablet TAKE 1 & 1/2 TABLETS BY MOUTH TWICE A DAY  . lactulose (CHRONULAC) 10 GM/15ML solution Take 10 g by mouth daily as needed (constipation).   [name] linaclotide (LINZESS) 290 MCG CAPS capsule Take 1 capsule (290 mcg total) by mouth daily before breakfast.  . LORazepam (ATIVAN) 0.5 MG tablet Take 0.5 mg by mouth every 12 (twelve) hours as needed for anxiety.  . metFORMIN (GLUCOPHAGE) 1000 MG tablet TAKE 1 TABLET BY MOUTH 2 TIMES DAILY WITH A MEAL. MUST CALL TO SCHEDULE AN APPT.  . Microlet Lancets MISC 1 each by Does not apply route 2 (two) times daily. E11.9  . nitroGLYCERIN (NITROSTAT) 0.4 MG SL tablet Place 1 tablet (0.4 mg total) under the tongue every 5 (five) minutes as needed for chest pain. Reported on 01/05/2016  . ondansetron (ZOFRAN-ODT) 4 MG disintegrating tablet Take 1 tablet (4 mg total) by mouth every 8 (eight) hours as needed for nausea or vomiting.  [name] oxyCODONE-acetaminophen (PERCOCET) 5-325 MG tablet Take 1 tablet by mouth every 6 (six) hours as needed for severe pain.  [name] PARoxetine Mesylate 7.5 MG CAPS Take 7.5 mg by mouth daily.  . predniSONE (DELTASONE) 5 MG tablet Take 5 mg by mouth daily with breakfast.  . telmisartan (MICARDIS) 20 MG tablet TAKE 1 TABLET BY MOUTH EVERY DAY  . triamcinolone cream (KENALOG) 0.1 % Apply 1 application topically 2 (two) times daily as needed (rash/irritation.).  [name] venlafaxine XR (EFFEXOR-XR) 75 MG 24 hr capsule TAKE 1 CAPSULE BY MOUTH TWICE A DAY  . [DISCONTINUED] doxycycline (VIBRA-TABS) 100 MG tablet Take 1 tablet (100 mg total) by mouth 2 (two) times daily.  . [DISCONTINUED] mupirocin nasal ointment (BACTROBAN) 2 % Place 1 application into the nose 2 (two) times daily. Use one-half of tube in each nostril twice daily for five (5) days. After application, press sides of nose together and gently massage.   No facility-administered  encounter medications on file as of 02/01/2020.    Allergies (verified) Methotrexate, Vancomycin, Lisinopril, Chlorhexidine, Clindamycin/lincomycin, Doxycycline, and Teflaro [ceftaroline]   History: Past Medical History:  Diagnosis Date  . Abnormal SPEP 04/17/2014  . Acute left ankle pain 01/26/2017  . ANEMIA-UNSPECIFIED 09/18/2009  . Anxiety   . Arthritis   . CHF (congestive heart failure) ([place])   . Chronic diastolic heart failure, NYHA class 2 (HCC)    Normal LVEDP by May 2018  . COPD (chronic obstructive pulmonary disease) ([place])   . Depression   . DIABETES MELLITUS, TYPE II 08/21/2006  . Diabetic osteomyelitis ([place]) 05/29/2015  . Fracture of 5th metatarsal    non union  . GERD [id number]   Had fundoplication  . GOUT 08/20/2010  . Hammer toe of right  foot    2-5th toes  . Hx of umbilical hernia repair   . HYPERLIPIDEMIA 08/21/2006  . HYPERTENSION 08/21/2006  . Infection of wound due to methicillin resistant Staphylococcus aureus (MRSA)   . Internal hemorrhoids   . Multiple allergies 10/14/2016  . OBESITY 06/04/2009  . Onychomycosis 10/27/2015  . Osteomyelitis of left foot ([place]) 05/29/2015  . Pulmonary sarcoidosis ([place])    Followed locally by pulmonology, but also by Dr. Casper Harrison at [facility]  . Right knee pain 01/26/2017  . Vocal cord dysfunction   . Wears partial dentures    Past Surgical History:  Procedure Laterality Date  . ABDOMINAL HYSTERECTOMY    . APPENDECTOMY    . BLADDER SUSPENSION  11/11/2011   Procedure: TRANSVAGINAL TAPE (TVT) PROCEDURE;  Surgeon: Olga Millers, MD;  Location: [facility];  Service: Gynecology;  Laterality: N/A;  . CAROTIDS  02/18/11   CAROTID DUPLEX; VERTEBRALS ARE PATENT WITH ANTEGRADE FLOW. ICA/CCA RATIO 1.61 ON RIGHT AND 0.75 ON LEFT  . CHOLECYSTECTOMY  1984  . CYSTOSCOPY  11/11/2011   Procedure: CYSTOSCOPY;  Surgeon: Olga Millers, MD;  Location: [facility];  Service: Gynecology;  Laterality: N/A;  . EXTUBATION (ENDOTRACHEAL) IN OR  N/A 09/23/2017   Procedure: EXTUBATION (ENDOTRACHEAL) IN OR;  Surgeon: Helayne Seminole, MD;  Location: [place];  Service: ENT;  Laterality: N/A;  . FIBEROPTIC LARYNGOSCOPY AND TRACHEOSCOPY N/A 09/23/2017   Procedure: FLEXIBLE FIBEROPTIC LARYNGOSCOPY;  Surgeon: Helayne Seminole, MD;  Location: [place];  Service: ENT;  Laterality: N/A;  . FRACTURE SURGERY     foot  . HALLUX FUSION Left 10/02/2018   Procedure: HALLUX ARTHRODESIS;  Surgeon: Edrick Kins, DPM;  Location: [facility];  Service: Podiatry;  Laterality: Left;  . HAMMER TOE SURGERY Left 10/02/2018   Procedure: HAMMER TOE CORRECTION 2ND 3RD 4RD FIFTH TOE;  Surgeon: Edrick Kins, DPM;  Location: [facility];  Service: Podiatry;  Laterality: Left;  . HAMMER TOE SURGERY Right 04/12/2019   Procedure: HAMMER TOE CORRECTION, 2ND, 3RD, 4TH AND 5TH TOES OF RIGHT FOOT;  Surgeon: Edrick Kins, DPM;  Location: [place];  Service: Podiatry;  Laterality: Right;  . HAMMER TOE SURGERY Left 10/25/2019   Procedure: HAMMER TOE REPAIR SECOND, THIRD, FOUTH;  Surgeon: Edrick Kins, DPM;  Location: [facility];  Service: Podiatry;  Laterality: Left;  . HERNIA REPAIR    . I & D EXTREMITY Left 06/27/2015   Procedure: Partial Excision Left Calcaneus, Place Antibiotic Beads, and Wound VAC;  Surgeon: Newt Minion, MD;  Location: [place];  Service: Orthopedics;  Laterality: Left;  . JOINT REPLACEMENT    . KNEE ARTHROSCOPY     right  . LEFT AND RIGHT HEART CATHETERIZATION WITH CORONARY ANGIOGRAM N/A 04/23/2013   Procedure: LEFT AND RIGHT HEART CATHETERIZATION WITH CORONARY ANGIOGRAM;  Surgeon: Leonie Man, MD;  Location: [facility] CATH LAB;  Service: Cardiovascular;  Laterality: N/A;  . LEFT HEART CATH AND CORONARY ANGIOGRAPHY N/A 03/11/2017   Procedure: Left Heart Cath and Coronary Angiography;  Surgeon: Sherren Mocha, MD;  Location: [facility] CV LAB; angiographically minimal CAD in the LAD otherwise normal.;  Normal LVEDP.  FALSE POSITIVE MYOVIEW  . LEFT HEART CATH AND  CORONARY ANGIOGRAPHY  07/20/2010   LVEF 50-55% WITH VERY MILD GLOBAL HYPOKINESIA; ESSENTIALLY NORMAL CORONARY ARTERIES; NORMAL LV FUNCTION  . METATARSAL OSTEOTOMY WITH OPEN REDUCTION INTERNAL FIXATION (ORIF) METATARSAL WITH FUSION Left 04/09/2014   Procedure: LEFT FOOT FRACTURE OPEN TREATMENT METATARSAL INCLUDES  INTERNAL FIXATION EACH;  Surgeon: Lorn Junes, MD;  Location: [place];  Service: Orthopedics;  Laterality: Left;  . NISSEN FUNDOPLICATION  [id number]  . NM MYOVIEW LTD  03/09/2013   Lexiscan --> EF 50%; NORMAL MYOCARDIAL PERFUSION STUDY - breast attenuation  . NM MYOVIEW LTD  03/10/2017   : Moderate size "stress-induced "perfusion defect at the apex as well as "ill-defined stress-induced perfusion defect in the lateral wall.  EF 55%.  INTERMEDIATE risk. -->  FALSE POSITIVE  . Right and left CARDIAC CATHETERIZATION  04/23/2013   Angiographic normal coronaries; LVEDP 20 mmHg, PCWP 12-14 mmHg, RAP 12 mmHg.; Fick CO/CI 4.9/2.2  . SHOULDER ARTHROSCOPY Left 03/14/2019   Procedure: LEFT SHOULDER ARTHROSCOPY, DEBRIDEMENT, AND DECOMPRESSION;  Surgeon: Newt Minion, MD;  Location: [place];  Service: Orthopedics;  Laterality: Left;  . TOTAL KNEE ARTHROPLASTY Right 06/29/2017   Procedure: RIGHT TOTAL KNEE ARTHROPLASTY;  Surgeon: Newt Minion, MD;  Location: [place];  Service: Orthopedics;  Laterality: Right;  . TOTAL KNEE ARTHROPLASTY Left 12/07/2017  . TOTAL KNEE ARTHROPLASTY Left 12/07/2017   Procedure: LEFT TOTAL KNEE ARTHROPLASTY;  Surgeon: Newt Minion, MD;  Location: [place];  Service: Orthopedics;  Laterality: Left;  . TRACHEOSTOMY TUBE PLACEMENT N/A 09/20/2017   Procedure: AWAKE INTUBATION WITH ANESTHESIA WITH VIDEO ASSISTANCE;  Surgeon: Helayne Seminole, MD;  Location: [facility];  Service: ENT;  Laterality: N/A;  . TRANSTHORACIC ECHOCARDIOGRAM  08/2014   Normal LV size and function.  Mild LVH.  EF 55-60%.  Normal regional wall motion.  GR 1 DD.  Normal RV size and function .  [name]  TUBAL LIGATION     with reversal in 1994  . VENTRAL HERNIA REPAIR     Family History  Problem Relation Age of Onset  . Diabetes Father   . Heart attack Father 26  . Coronary artery disease Father   . Heart failure Father   . Throat cancer Father   . COPD Mother   . Emphysema Mother   . Asthma Mother   . Heart failure Mother   . Breast cancer Mother   . Heart attack Maternal Grandfather   . Sarcoidosis Maternal Uncle   . Lung cancer Brother   . Diabetes Brother   . Colon cancer Neg Hx   . Rectal cancer Neg Hx    Social History   Socioeconomic History  . Marital status: Married    Spouse name: ANAILA COLBY  . Number of children: 2  . Years of education: 61  . Highest education level: Not on file  Occupational History  . Occupation: DISABLED  Tobacco Use  . Smoking status: Never Smoker  . Smokeless tobacco: Never Used  Substance and Sexual Activity  . Alcohol use: No    Alcohol/week: 0.0 standard drinks  . Drug use: No  . Sexual activity: Yes    Birth control/protection: Surgical  Other Topics Concern  . Not on file  Social History Narrative   Married 1994. 2 sons who both live close and 1 grandson.       Disability due to sarcoidosis. 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 Difficulty of Paying Living Expenses:   Food Insecurity:   . Worried About Charity fundraiser in  the Last Year:   . [place] in the Last Year:   Transportation Needs:   . Film/video editor (Medical):   [name] Lack of Transportation (Non-Medical):   Physical Activity:   . Days of Exercise per Week:   . Minutes of Exercise per Session:   Stress:   . Feeling of Stress :   Social Connections:   . Frequency of Communication with Friends and Family:   . Frequency of Social  Gatherings with Friends and Family:   . Attends Religious Services:   . Active Member of Clubs or Organizations:   . Attends Archivist Meetings:   [name] Marital Status:     Tobacco Counseling Counseling given: Not Answered   Clinical Intake:  Pre-visit preparation completed: Yes  Pain : No/denies pain  Diabetes: Yes(followed by endocrinology) CBG done?: No Did pt. bring in CBG monitor from home?: No  How often do you need to have someone help you when you read instructions, pamphlets, or other written materials from your doctor or pharmacy?: 1 - Never  Interpreter Needed?: No  Information entered by :: Denman George LPN   Activities of Daily Living In your present state of health, do you have any difficulty performing the following activities: 02/01/2020 10/18/2019  Hearing? N N  Vision? N N  Difficulty concentrating or making decisions? N N  Walking or climbing stairs? N N  Comment at times due to respiratory status -  Dressing or bathing? N N  Doing errands, shopping? N N  Preparing Food and eating ? N -  Using the Toilet? N -  In the past six months, have you accidently leaked urine? N -  Do you have problems with loss of bowel control? N -  Managing your Medications? N -  Managing your Finances? N -  Housekeeping or managing your Housekeeping? N -  Some recent data might be hidden     Immunizations and Health Maintenance Immunization History  Administered Date(s) Administered  . H1N1 09/24/2008  . Hep A / Hep B 02/20/2018, 04/03/2018, 08/23/2018  . Influenza Inj Mdck Quad With Preservative 07/12/2019  . Influenza Split 07/07/2011, 07/18/2012  . Influenza Whole 08/15/2007, 06/26/2008, 07/23/2009, 07/16/2010  . Influenza, Seasonal, Injecte, Preservative Fre 10/26/2011, 07/18/2012  . Influenza,inj,Quad PF,6+ Mos 06/12/2013, 07/15/2014, 08/08/2015, 06/22/2016, 07/01/2017, 07/14/2018, 07/04/2019  . Influenza-Unspecified 09/24/2008  . PFIZER SARS-COV-2  Vaccination 12/26/2019, 01/16/2020  . Pneumococcal Conjugate-13 05/06/2014  . Pneumococcal Polysaccharide-23 09/30/2008, 02/13/2013  . Td 05/21/2010  . Zoster Recombinat (Shingrix) 03/02/2017, 05/02/2017   Health Maintenance Due  Topic Date Due  . OPHTHALMOLOGY EXAM  11/24/2019    Patient Care Team: [name], MD as PCP - General (Family Medicine) Leonie Man, MD as PCP - Cardiology (Cardiology) Elsie Stain, MD (Pulmonary Disease) Elsie Saas, MD as Consulting Physician (Orthopedic Surgery) Wenda Low, MD as Referring Physician (Pulmonary Disease) Katy Apo, MD as Consulting Physician (Ophthalmology) Lyndee Hensen, PT as Physical Therapist (Physical Therapy) Jonelle Sidle, MD as Consulting Physician (Obstetrics and Gynecology) Edrick Kins, DPM as Consulting Physician (Podiatry)  Indicate any recent Medical Services you may have received from other than [facility] providers in the past year (date may be approximate).     Assessment:   This is a routine wellness examination for [name].  Hearing/Vision screen No exam data present  Dietary issues and exercise activities discussed: Current Exercise Habits: The patient does not participate in regular exercise at present, Exercise limited by:  respiratory conditions(s)  Goals   None    Depression Screen PHQ 2/9 Scores 02/01/2020 01/22/2020 09/13/2019 07/04/2019 04/10/2019 01/04/2019 02/20/2018  PHQ - 2 Score 0 0 0 0 0 1 1  PHQ- 9 Score - 0 1 0 0 3 3    Fall Risk Fall Risk  02/01/2020 07/28/2018 06/16/2017 01/26/2017 10/27/2015  Falls in the past year? 0 No No No No  Number falls in past yr: 0 - - - -  Injury with Fall? 0 - - - -  Risk for fall due to : - - - - -  Follow up Falls evaluation completed;Falls prevention discussed;Education provided - - - -    Is the patient's home free of loose throw rugs in walkways, pet beds, electrical cords, etc?   yes      Grab bars in the bathroom? yes       Handrails on the stairs?   yes      Adequate lighting?   yes   Cognitive Function: no cognitive concerns at this time    6CIT Screen 02/01/2020  What Year? 0 points  What month? 0 points  What time? 0 points  Count back from 20 0 points  Months in reverse 0 points  Repeat phrase 0 points  Total Score 0    Screening Tests Health Maintenance  Topic Date Due  . OPHTHALMOLOGY EXAM  11/24/2019  . HEMOGLOBIN A1C  04/16/2020  . COLONOSCOPY  04/29/2020  . INFLUENZA VACCINE  05/11/2020  . TETANUS/TDAP  05/21/2020  . FOOT EXAM  10/21/2020  . MAMMOGRAM  05/27/2021  . PAP SMEAR-Modifier  10/16/2022  . DEXA SCAN  10/31/2022  . PNEUMOCOCCAL POLYSACCHARIDE VACCINE AGE 70-64 HIGH RISK  Completed  . COVID-19 Vaccine  Completed  . Hepatitis C Screening  Completed  . HIV Screening  Completed    Qualifies for Shingles Vaccine?  Shingrix completed   Cancer Screenings: Lung: Low Dose CT Chest recommended if Age 31-80 years, 30 pack-year currently smoking OR have quit w/in 15years. Patient does not qualify. Breast: Up to date on Mammogram? Yes   Up to date of Bone Density/Dexa? Yes Colorectal: colonoscopy 04/29/10    Plan:  I have personally reviewed and addressed the Medicare Annual Wellness questionnaire and have noted the following in the patient's chart:  A. Medical and social history B. Use of alcohol, tobacco or illicit drugs  C. Current medications and supplements D. Functional ability and status E.  Nutritional status F.  Physical activity G. Advance directives H. List of other physicians I.  Hospitalizations, surgeries, and ER visits in previous 12 months J.  [place] such as hearing and vision if needed, cognitive and depression L. Referrals, records requested, and appointments- will request last diabetic eye exam notes   In addition, I have reviewed and discussed with patient certain preventive protocols, quality metrics, and best practice recommendations. A  written personalized care plan for preventive services as well as general preventive health recommendations were provided to patient.   Signed,  Denman George, LPN  Nurse Health Advisor   Nurse Notes: no additional

## 2020-02-04 ENCOUNTER — Other Ambulatory Visit: Payer: Self-pay | Admitting: Cardiology

## 2020-02-05 ENCOUNTER — Other Ambulatory Visit: Payer: Self-pay

## 2020-02-07 ENCOUNTER — Other Ambulatory Visit: Payer: Self-pay

## 2020-02-07 ENCOUNTER — Encounter: Payer: Self-pay | Admitting: Endocrinology

## 2020-02-07 ENCOUNTER — Ambulatory Visit (INDEPENDENT_AMBULATORY_CARE_PROVIDER_SITE_OTHER): Payer: 59 | Admitting: Endocrinology

## 2020-02-07 ENCOUNTER — Telehealth: Payer: Self-pay

## 2020-02-07 VITALS — BP 140/70 | HR 92 | Ht 66.0 in | Wt 243.0 lb

## 2020-02-07 DIAGNOSIS — Z794 Long term (current) use of insulin: Secondary | ICD-10-CM | POA: Diagnosis not present

## 2020-02-07 DIAGNOSIS — E1129 Type 2 diabetes mellitus with other diabetic kidney complication: Secondary | ICD-10-CM | POA: Diagnosis not present

## 2020-02-07 LAB — POCT GLYCOSYLATED HEMOGLOBIN (HGB A1C): Hemoglobin A1C: 7 % — AB (ref 4.0–5.6)

## 2020-02-07 MED ORDER — TRULICITY 1.5 MG/0.5ML ~~LOC~~ SOAJ: 1.5000 mg | SUBCUTANEOUS | 11 refills | Status: DC

## 2020-02-07 MED ORDER — INSULIN LISPRO (1 UNIT DIAL) 100 UNIT/ML (KWIKPEN): 20.0000 [IU] | PEN_INJECTOR | Freq: Every day | SUBCUTANEOUS | Status: DC

## 2020-02-07 MED ORDER — LANTUS SOLOSTAR 100 UNIT/ML ~~LOC~~ SOPN: 40.0000 [IU] | PEN_INJECTOR | SUBCUTANEOUS | 2 refills | Status: DC

## 2020-02-07 NOTE — Progress Notes (Signed)
Subjective:    Patient ID: [name], female    DOB: 02-11-1961, 59 y.o.   MRN: [id number]  HPI Pt returns for f/u of diabetes mellitus: DM type: Insulin-requiring type 2 Dx'ed: [id number] Complications: PN and mild CAD.  Therapy: insulin since 2006, and 2 oral meds.  GDM: never DKA: never Severe hypoglycemia: never Pancreatitis: never Pancreatic imaging: CT (2015) showed fatty atrophy.  Other: she takes 2 QD insulins, after poor results with multiple daily injections; she intermittently takes prednisone for sarcoidosis or AB.  Interval history: prednisone is 5 mg qd.  She takes meds as rx'ed.  cbg varies from 69-174.  pt states she feels well in general.  Past Medical History:  Diagnosis Date  . Abnormal SPEP 04/17/2014  . Acute left ankle pain 01/26/2017  . ANEMIA-UNSPECIFIED 09/18/2009  . Anxiety   . Arthritis   . CHF (congestive heart failure) ([place])   . Chronic diastolic heart failure, NYHA class 2 (HCC)    Normal LVEDP by May 2018  . COPD (chronic obstructive pulmonary disease) ([place])   . Depression   . DIABETES MELLITUS, TYPE II 08/21/2006  . Diabetic osteomyelitis ([place]) 05/29/2015  . Fracture of 5th metatarsal    non union  . GERD [id number]   Had fundoplication  . GOUT 08/20/2010  . Hammer toe of right foot    2-5th toes  . Hx of umbilical hernia repair   . HYPERLIPIDEMIA 08/21/2006  . HYPERTENSION 08/21/2006  . Infection of wound due to methicillin resistant Staphylococcus aureus (MRSA)   . Internal hemorrhoids   . Multiple allergies 10/14/2016  . OBESITY 06/04/2009  . Onychomycosis 10/27/2015  . Osteomyelitis of left foot ([place]) 05/29/2015  . Pulmonary sarcoidosis ([place])    Followed locally by pulmonology, but also by Dr. Casper Harrison at [facility]  . Right knee pain 01/26/2017  . Vocal cord dysfunction   . Wears partial dentures     Past Surgical History:  Procedure Laterality Date  . ABDOMINAL HYSTERECTOMY    . APPENDECTOMY    . BLADDER SUSPENSION   11/11/2011   Procedure: TRANSVAGINAL TAPE (TVT) PROCEDURE;  Surgeon: Olga Millers, MD;  Location: [facility];  Service: Gynecology;  Laterality: N/A;  . CAROTIDS  02/18/11   CAROTID DUPLEX; VERTEBRALS ARE PATENT WITH ANTEGRADE FLOW. ICA/CCA RATIO 1.61 ON RIGHT AND 0.75 ON LEFT  . CHOLECYSTECTOMY  1984  . CYSTOSCOPY  11/11/2011   Procedure: CYSTOSCOPY;  Surgeon: Olga Millers, MD;  Location: [facility];  Service: Gynecology;  Laterality: N/A;  . EXTUBATION (ENDOTRACHEAL) IN OR N/A 09/23/2017   Procedure: EXTUBATION (ENDOTRACHEAL) IN OR;  Surgeon: Helayne Seminole, MD;  Location: [facility];  Service: ENT;  Laterality: N/A;  . FIBEROPTIC LARYNGOSCOPY AND TRACHEOSCOPY N/A 09/23/2017   Procedure: FLEXIBLE FIBEROPTIC LARYNGOSCOPY;  Surgeon: Helayne Seminole, MD;  Location: [facility];  Service: ENT;  Laterality: N/A;  . FRACTURE SURGERY     foot  . HALLUX FUSION Left 10/02/2018   Procedure: HALLUX ARTHRODESIS;  Surgeon: Edrick Kins, DPM;  Location: [facility];  Service: Podiatry;  Laterality: Left;  . HAMMER TOE SURGERY Left 10/02/2018   Procedure: HAMMER TOE CORRECTION 2ND 3RD 4RD FIFTH TOE;  Surgeon: Edrick Kins, DPM;  Location: [facility];  Service: Podiatry;  Laterality: Left;  . HAMMER TOE SURGERY Right 04/12/2019   Procedure: HAMMER TOE CORRECTION, 2ND, 3RD, 4TH AND 5TH TOES OF RIGHT FOOT;  Surgeon: Edrick Kins, DPM;  Location: [facility];  Service:  Podiatry;  Laterality: Right;  . HAMMER TOE SURGERY Left 10/25/2019   Procedure: HAMMER TOE REPAIR SECOND, THIRD, FOUTH;  Surgeon: Edrick Kins, DPM;  Location: [facility];  Service: Podiatry;  Laterality: Left;  . HERNIA REPAIR    . I & D EXTREMITY Left 06/27/2015   Procedure: Partial Excision Left Calcaneus, Place Antibiotic Beads, and Wound VAC;  Surgeon: Newt Minion, MD;  Location: [place];  Service: Orthopedics;  Laterality: Left;  . JOINT REPLACEMENT    . KNEE ARTHROSCOPY     right  . LEFT AND RIGHT HEART CATHETERIZATION WITH CORONARY ANGIOGRAM N/A  04/23/2013   Procedure: LEFT AND RIGHT HEART CATHETERIZATION WITH CORONARY ANGIOGRAM;  Surgeon: Leonie Man, MD;  Location: [facility];  Service: Cardiovascular;  Laterality: N/A;  . LEFT HEART CATH AND CORONARY ANGIOGRAPHY N/A 03/11/2017   Procedure: Left Heart Cath and Coronary Angiography;  Surgeon: Sherren Mocha, MD;  Location: [place] CV LAB; angiographically minimal CAD in the LAD otherwise normal.;  Normal LVEDP.  FALSE POSITIVE MYOVIEW  . LEFT HEART CATH AND CORONARY ANGIOGRAPHY  07/20/2010   LVEF 50-55% WITH VERY MILD GLOBAL HYPOKINESIA; ESSENTIALLY NORMAL CORONARY ARTERIES; NORMAL LV FUNCTION  . METATARSAL OSTEOTOMY WITH OPEN REDUCTION INTERNAL FIXATION (ORIF) METATARSAL WITH FUSION Left 04/09/2014   Procedure: LEFT FOOT FRACTURE OPEN TREATMENT METATARSAL INCLUDES INTERNAL FIXATION EACH;  Surgeon: Lorn Junes, MD;  Location: [place];  Service: Orthopedics;  Laterality: Left;  . NISSEN FUNDOPLICATION  [id number]  . NM MYOVIEW LTD  03/09/2013   Lexiscan --> EF 50%; NORMAL MYOCARDIAL PERFUSION STUDY - breast attenuation  . NM MYOVIEW LTD  03/10/2017   : Moderate size "stress-induced "perfusion defect at the apex as well as "ill-defined stress-induced perfusion defect in the lateral wall.  EF 55%.  INTERMEDIATE risk. -->  FALSE POSITIVE  . Right and left CARDIAC CATHETERIZATION  04/23/2013   Angiographic normal coronaries; LVEDP 20 mmHg, PCWP 12-14 mmHg, RAP 12 mmHg.; Fick CO/CI 4.9/2.2  . SHOULDER ARTHROSCOPY Left 03/14/2019   Procedure: LEFT SHOULDER ARTHROSCOPY, DEBRIDEMENT, AND DECOMPRESSION;  Surgeon: Newt Minion, MD;  Location: [place];  Service: Orthopedics;  Laterality: Left;  . TOTAL KNEE ARTHROPLASTY Right 06/29/2017   Procedure: RIGHT TOTAL KNEE ARTHROPLASTY;  Surgeon: Newt Minion, MD;  Location: [facility];  Service: Orthopedics;  Laterality: Right;  . TOTAL KNEE ARTHROPLASTY Left 12/07/2017  . TOTAL KNEE ARTHROPLASTY Left 12/07/2017   Procedure: LEFT TOTAL  KNEE ARTHROPLASTY;  Surgeon: Newt Minion, MD;  Location: [place];  Service: Orthopedics;  Laterality: Left;  . TRACHEOSTOMY TUBE PLACEMENT N/A 09/20/2017   Procedure: AWAKE INTUBATION WITH ANESTHESIA WITH VIDEO ASSISTANCE;  Surgeon: Helayne Seminole, MD;  Location: [facility];  Service: ENT;  Laterality: N/A;  . TRANSTHORACIC ECHOCARDIOGRAM  08/2014   Normal LV size and function.  Mild LVH.  EF 55-60%.  Normal regional wall motion.  GR 1 DD.  Normal RV size and function .  [name] TUBAL LIGATION     with reversal in 1994  . VENTRAL HERNIA REPAIR      Social History   Socioeconomic History  . Marital status: Married    Spouse name: SIERAH WEGE  . Number of children: 2  . Years of education: 42  . Highest education level: Not on file  Occupational History  . Occupation: DISABLED  Tobacco Use  . Smoking status: Never Smoker  . Smokeless tobacco: Never Used  Substance and Sexual Activity  . Alcohol  use: No    Alcohol/week: 0.0 standard drinks  . Drug use: No  . Sexual activity: Yes    Birth control/protection: Surgical  Other Topics Concern  . Not on file  Social History Narrative   Married 1994. 2 sons who both live close and 1 grandson.       Disability due to sarcoidosis. 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 Difficulty of Paying Living Expenses:   Food Insecurity:   . Worried About Charity fundraiser in the Last Year:   . Arboriculturist in the Last Year:   Transportation Needs:   . Film/video editor (Medical):   [name] Lack of Transportation (Non-Medical):   Physical Activity:   . Days of Exercise per Week:   . Minutes of Exercise per Session:   Stress:   . Feeling of Stress :   Social Connections:   . Frequency of Communication with Friends and Family:     . Frequency of Social Gatherings with Friends and Family:   . Attends Religious Services:   . Active Member of Clubs or Organizations:   . Attends Archivist Meetings:   [name] Marital Status:   Intimate Partner Violence:   . Fear of Current or Ex-Partner:   . Emotionally Abused:   [name] Physically Abused:   . Sexually Abused:     Current Outpatient Medications on File Prior to Visit  Medication Sig Dispense Refill  . albuterol (PROVENTIL HFA;VENTOLIN HFA) 108 (90 Base) MCG/ACT inhaler Inhale 1-2 puffs into the lungs every 6 (six) hours as needed for wheezing or shortness of breath. 8 g 5  . allopurinol (ZYLOPRIM) 100 MG tablet Take 100 mg by mouth every evening.     [name] aspirin EC 81 MG tablet Take 81 mg by mouth daily.     [name] atorvastatin (LIPITOR) 40 MG tablet TAKE 1 TABLET BY MOUTH EVERY DAY 90 tablet 3  . BREO ELLIPTA 200-25 MCG/INH AEPB TAKE 1 PUFF BY MOUTH EVERY DAY 60 each 5  . buPROPion (WELLBUTRIN XL) 300 MG 24 hr tablet TAKE 1 TABLET BY MOUTH EVERY DAY 90 tablet 3  . carvedilol (COREG) 25 MG tablet TAKE 1 TABLET (25 MG TOTAL) BY MOUTH 2 (TWO) TIMES DAILY WITH A MEAL. 180 tablet 3  . chlorpheniramine-HYDROcodone (TUSSIONEX PENNKINETIC ER) 10-8 MG/5ML SUER Take 5 mLs by mouth 2 (two) times daily. 140 mL 0  . cholecalciferol (VITAMIN D) 25 MCG (1000 UT) tablet Take 1,000 Units by mouth 2 (two) times a day.    [name] dexlansoprazole (DEXILANT) 60 MG capsule Take 1 capsule (60 mg total) by mouth daily. 30 capsule 5  . diclofenac Sodium (VOLTAREN) 1 % GEL Apply 1 application topically 4 (four) times daily as needed (pain.).    [name] diltiazem (CARDIZEM CD) 120 MG 24 hr capsule TAKE 1 CAPSULE BY MOUTH EVERY DAY 90 capsule 0  . famotidine (PEPCID) 20 MG tablet Take 1 tablet (20 mg total) by mouth at bedtime. 30 tablet 3  . fluticasone (FLONASE) 50 MCG/ACT nasal spray Place 2 sprays into both nostrils daily as needed for allergies. 48 mL 3  . furosemide (LASIX) 40 MG tablet TAKE 1 TABLET BY  MOUTH TWICE A DAY 180 tablet 3  . gabapentin (  NEURONTIN) 100 MG capsule TAKE 1 CAPSULE BY MOUTH THREE TIMES A DAY 270 capsule 3  . glucose blood (CONTOUR NEXT TEST) test strip 1 each by Other route 4 (four) times daily. And lancets 4/day 400 each 3  . icosapent Ethyl (VASCEPA) 1 g capsule Take 2 capsules (2 g total) by mouth 2 (two) times daily. 120 capsule 11  . Insulin Pen Needle 33G X 4 MM MISC 1 each by Does not apply route 4 (four) times daily. 200 each 11  . isosorbide mononitrate (IMDUR) 30 MG 24 hr tablet TAKE 1 TABLET (30 MG TOTAL) BY MOUTH AT BEDTIME. 90 tablet 1  . KLOR-CON M20 20 MEQ tablet TAKE 1 & 1/2 TABLETS BY MOUTH TWICE A DAY 270 tablet 2  . lactulose (CHRONULAC) 10 GM/15ML solution Take 10 g by mouth daily as needed (constipation).     [name] linaclotide (LINZESS) 290 MCG CAPS capsule Take 1 capsule (290 mcg total) by mouth daily before breakfast. 30 capsule 11  . LORazepam (ATIVAN) 0.5 MG tablet Take 0.5 mg by mouth every 12 (twelve) hours as needed for anxiety.    . metFORMIN (GLUCOPHAGE) 1000 MG tablet TAKE 1 TABLET BY MOUTH 2 TIMES DAILY WITH A MEAL. MUST CALL TO SCHEDULE AN APPT. 180 tablet 1  . Microlet Lancets MISC 1 each by Does not apply route 2 (two) times daily. E11.9 100 each 2  . nitroGLYCERIN (NITROSTAT) 0.4 MG SL tablet Place 1 tablet (0.4 mg total) under the tongue every 5 (five) minutes as needed for chest pain. Reported on 01/05/2016 25 tablet 6  . ondansetron (ZOFRAN-ODT) 4 MG disintegrating tablet Take 1 tablet (4 mg total) by mouth every 8 (eight) hours as needed for nausea or vomiting. 50 tablet 2  . oxyCODONE-acetaminophen (PERCOCET) 5-325 MG tablet Take 1 tablet by mouth every 6 (six) hours as needed for severe pain. 30 tablet 0  . PARoxetine Mesylate 7.5 MG CAPS Take 7.5 mg by mouth daily. 90 capsule 3  . predniSONE (DELTASONE) 5 MG tablet Take 5 mg by mouth daily with breakfast.    . telmisartan (MICARDIS) 20 MG tablet TAKE 1 TABLET BY MOUTH EVERY DAY 90 tablet  1  . triamcinolone cream (KENALOG) 0.1 % Apply 1 application topically 2 (two) times daily as needed (rash/irritation.).    [name] venlafaxine XR (EFFEXOR-XR) 75 MG 24 hr capsule TAKE 1 CAPSULE BY MOUTH TWICE A DAY 180 capsule 3  . fenofibrate 160 MG tablet TAKE 1 TABLET BY MOUTH EVERY DAY 90 tablet 1  . [DISCONTINUED] mupirocin nasal ointment (BACTROBAN) 2 % Place 1 application into the nose 2 (two) times daily. Use one-half of tube in each nostril twice daily for five (5) days. After application, press sides of nose together and gently massage. 10 g 0   No current facility-administered medications on file prior to visit.    Allergies  Allergen Reactions  . Methotrexate Other (See Comments)    Peri-oral and buccal lesions  . Vancomycin Other (See Comments)    DOSE RELATED NEPHROTOXICITY  . Lisinopril Cough  . Chlorhexidine Itching  . Clindamycin/Lincomycin Nausea And Vomiting and Rash  . Doxycycline Rash  . Teflaro [Ceftaroline] Rash and Other (See Comments)    Tolerates ceftriaxone     Family History  Problem Relation Age of Onset  . Diabetes Father   . Heart attack Father 21  . Coronary artery disease Father   . Heart failure Father   . Throat cancer Father   . COPD  Mother   . Emphysema Mother   . Asthma Mother   . Heart failure Mother   . Breast cancer Mother   . Heart attack Maternal Grandfather   . Sarcoidosis Maternal Uncle   . Lung cancer Brother   . Diabetes Brother   . Colon cancer Neg Hx   . Rectal cancer Neg Hx     BP 140/70   Pulse 92   Ht 5\' 6"  (1.676 m)   Wt 243 lb (110.2 kg)   SpO2 95%   BMI 39.22 kg/m    Review of Systems Denies nausea    Objective:   Physical Exam VITAL SIGNS:  See vs page GENERAL: no distress Pulses: dorsalis pedis intact bilat.   MSK: no deformity of the feet CV: no leg edema Skin:  no ulcer on the feet.  normal color and temp on the feet. Neuro: sensation is intact to touch on the feet Ext: there is bilateral  onychomycosis of the toenails  Lab Results  Component Value Date   HGBA1C 7.0 (A) 02/07/2020       Assessment & Plan:  Insulin-requiring type 2 DM, with CAD: Based on the pattern of her cbg's, she needs some adjustment in her therapy Hypoglycemia, due to insulin: this limits aggressiveness of glycemic control HTN: is noted today.    Patient Instructions  Please reduce the insulins to the numbers listed below, and: I have sent a prescription to your pharmacy, to double theTrulicity. Your blood pressure is high today.  Please see your primary care provider soon, to have it rechecked check your blood sugar 4 times a day: before the 3 meals, and at bedtime.  also check if you have symptoms of your blood sugar being too high or too low.  please keep a record of the readings and bring it to your next appointment here (or you can bring the meter itself).  You can write it on any piece of paper.  please call us sooner if your blood sugar goes below 70, or if you have a lot of readings over 200.  Please come back for a follow-up appointment in 2-3 months.      Diabetes Mellitus and Nutrition, Adult When you have diabetes (diabetes mellitus), it is very important to have healthy eating habits because your blood sugar (glucose) levels are greatly affected by what you eat and drink. Eating healthy foods in the appropriate amounts, at about the same times every day, can help you:  Control your blood glucose.  Lower your risk of heart disease.  Improve your blood pressure.  Reach or maintain a healthy weight. Every person with diabetes is different, and each person has different needs for a meal plan. Your health care provider may recommend that you work with a diet and nutrition specialist (dietitian) to make a meal plan that is best for you. Your meal plan may vary depending on factors such as:  The calories you need.  The medicines you take.  Your weight.  Your blood glucose, blood  pressure, and cholesterol levels.  Your activity level.  Other health conditions you have, such as heart or kidney disease. How do carbohydrates affect me? Carbohydrates, also called carbs, affect your blood glucose level more than any other type of food. Eating carbs naturally raises the amount of glucose in your blood. Carb counting is a method for keeping track of how many carbs you eat. Counting carbs is important to keep your blood glucose at a healthy level,  especially if you use insulin or take certain oral diabetes medicines. It is important to know how many carbs you can safely have in each meal. This is different for every person. Your dietitian can help you calculate how many carbs you should have at each meal and for each snack. Foods that contain carbs include:  Bread, cereal, rice, pasta, and crackers.  Potatoes and corn.  Peas, beans, and lentils.  Milk and yogurt.  Fruit and juice.  Desserts, such as cakes, cookies, ice cream, and candy. How does alcohol affect me? Alcohol can cause a sudden decrease in blood glucose (hypoglycemia), especially if you use insulin or take certain oral diabetes medicines. Hypoglycemia can be a life-threatening condition. Symptoms of hypoglycemia (sleepiness, dizziness, and confusion) are similar to symptoms of having too much alcohol. If your health care provider says that alcohol is safe for you, follow these guidelines:  Limit alcohol intake to no more than 1 drink per day for nonpregnant women and 2 drinks per day for men. One drink equals 12 oz of beer, 5 oz of wine, or 1 oz of hard liquor.  Do not drink on an empty stomach.  Keep yourself hydrated with water, diet soda, or unsweetened iced tea.  Keep in mind that regular soda, juice, and other mixers may contain a lot of sugar and must be counted as carbs. What are tips for following this plan?  Reading food labels  Start by checking the serving size on the "Nutrition Facts"  label of packaged foods and drinks. The amount of calories, carbs, fats, and other nutrients listed on the label is based on one serving of the item. Many items contain more than one serving per package.  Check the total grams (g) of carbs in one serving. You can calculate the number of servings of carbs in one serving by dividing the total carbs by 15. For example, if a food has 30 g of total carbs, it would be equal to 2 servings of carbs.  Check the number of grams (g) of saturated and trans fats in one serving. Choose foods that have low or no amount of these fats.  Check the number of milligrams (mg) of salt (sodium) in one serving. Most people should limit total sodium intake to less than 2,300 mg per day.  Always check the nutrition information of foods labeled as "low-fat" or "nonfat". These foods may be higher in added sugar or refined carbs and should be avoided.  Talk to your dietitian to identify your daily goals for nutrients listed on the label. Shopping  Avoid buying canned, premade, or processed foods. These foods tend to be high in fat, sodium, and added sugar.  Shop around the outside edge of the grocery store. This includes fresh fruits and vegetables, bulk grains, fresh meats, and fresh dairy. Cooking  Use low-heat cooking methods, such as baking, instead of high-heat cooking methods like deep frying.  Cook using healthy oils, such as olive, canola, or sunflower oil.  Avoid cooking with butter, cream, or high-fat meats. Meal planning  Eat meals and snacks regularly, preferably at the same times every day. Avoid going long periods of time without eating.  Eat foods high in fiber, such as fresh fruits, vegetables, beans, and whole grains. Talk to your dietitian about how many servings of carbs you can eat at each meal.  Eat 4-6 ounces (oz) of lean protein each day, such as lean meat, chicken, fish, eggs, or tofu. One oz of lean  protein is equal to: ? 1 oz of meat,  chicken, or fish. ? 1 egg. ?  cup of tofu.  Eat some foods each day that contain healthy fats, such as avocado, nuts, seeds, and fish. Lifestyle  Check your blood glucose regularly.  Exercise regularly as told by your health care provider. This may include: ? 150 minutes of moderate-intensity or vigorous-intensity exercise each week. This could be brisk walking, biking, or water aerobics. ? Stretching and doing strength exercises, such as yoga or weightlifting, at least 2 times a week.  Take medicines as told by your health care provider.  Do not use any products that contain nicotine or tobacco, such as cigarettes and e-cigarettes. If you need help quitting, ask your health care provider.  Work with a Social worker or diabetes educator to identify strategies to manage stress and any emotional and social challenges. Questions to ask a health care provider  Do I need to meet with a diabetes educator?  Do I need to meet with a dietitian?  What number can I call if I have questions?  When are the best times to check my blood glucose? Where to find more information:  American Diabetes Association: diabetes.org  Academy of Nutrition and Dietetics: www.eatright.[employer] of Diabetes and Digestive and Kidney Diseases (NIH): [employer] Summary  A healthy meal plan will help you control your blood glucose and maintain a healthy lifestyle.  Working with a diet and nutrition specialist (dietitian) can help you make a meal plan that is best for you.  Keep in mind that carbohydrates (carbs) and alcohol have immediate effects on your blood glucose levels. It is important to count carbs and to use alcohol carefully. This information is not intended to replace advice given to you by your health care provider. Make sure you discuss any questions you have with your health care provider. Document Revised: 09/09/2017 Document Reviewed: 11/01/2016 Elsevier Patient Education   2020 [employer].

## 2020-02-07 NOTE — Telephone Encounter (Signed)
FAXED [place]: [facility] and [facility] for testing supplies Other records requested: None requested  All above requested information has been faxed successfully to [employer] [facility] listed above. Documents and fax confirmation have been placed in the faxed file for future reference.

## 2020-02-07 NOTE — Patient Instructions (Addendum)
Please reduce the insulins to the numbers listed below, and: I have sent a prescription to your pharmacy, to double theTrulicity. Your blood pressure is high today.  Please see your primary care provider soon, to have it rechecked check your blood sugar 4 times a day: before the 3 meals, and at bedtime.  also check if you have symptoms of your blood sugar being too high or too low.  please keep a record of the readings and bring it to your next appointment here (or you can bring the meter itself).  You can write it on any piece of paper.  please call us sooner if your blood sugar goes below 70, or if you have a lot of readings over 200.  Please come back for a follow-up appointment in 2-3 months.      Diabetes Mellitus and Nutrition, Adult When you have diabetes (diabetes mellitus), it is very important to have healthy eating habits because your blood sugar (glucose) levels are greatly affected by what you eat and drink. Eating healthy foods in the appropriate amounts, at about the same times every day, can help you:  Control your blood glucose.  Lower your risk of heart disease.  Improve your blood pressure.  Reach or maintain a healthy weight. Every person with diabetes is different, and each person has different needs for a meal plan. Your health care provider may recommend that you work with a diet and nutrition specialist (dietitian) to make a meal plan that is best for you. Your meal plan may vary depending on factors such as:  The calories you need.  The medicines you take.  Your weight.  Your blood glucose, blood pressure, and cholesterol levels.  Your activity level.  Other health conditions you have, such as heart or kidney disease. How do carbohydrates affect me? Carbohydrates, also called carbs, affect your blood glucose level more than any other type of food. Eating carbs naturally raises the amount of glucose in your blood. Carb counting is a method for keeping track of  how many carbs you eat. Counting carbs is important to keep your blood glucose at a healthy level, especially if you use insulin or take certain oral diabetes medicines. It is important to know how many carbs you can safely have in each meal. This is different for every person. Your dietitian can help you calculate how many carbs you should have at each meal and for each snack. Foods that contain carbs include:  Bread, cereal, rice, pasta, and crackers.  Potatoes and corn.  Peas, beans, and lentils.  Milk and yogurt.  Fruit and juice.  Desserts, such as cakes, cookies, ice cream, and candy. How does alcohol affect me? Alcohol can cause a sudden decrease in blood glucose (hypoglycemia), especially if you use insulin or take certain oral diabetes medicines. Hypoglycemia can be a life-threatening condition. Symptoms of hypoglycemia (sleepiness, dizziness, and confusion) are similar to symptoms of having too much alcohol. If your health care provider says that alcohol is safe for you, follow these guidelines:  Limit alcohol intake to no more than 1 drink per day for nonpregnant women and 2 drinks per day for men. One drink equals 12 oz of beer, 5 oz of wine, or 1 oz of hard liquor.  Do not drink on an empty stomach.  Keep yourself hydrated with water, diet soda, or unsweetened iced tea.  Keep in mind that regular soda, juice, and other mixers may contain a lot of sugar and must be  counted as carbs. What are tips for following this plan?  Reading food labels  Start by checking the serving size on the "Nutrition Facts" label of packaged foods and drinks. The amount of calories, carbs, fats, and other nutrients listed on the label is based on one serving of the item. Many items contain more than one serving per package.  Check the total grams (g) of carbs in one serving. You can calculate the number of servings of carbs in one serving by dividing the total carbs by 15. For example, if a  food has 30 g of total carbs, it would be equal to 2 servings of carbs.  Check the number of grams (g) of saturated and trans fats in one serving. Choose foods that have low or no amount of these fats.  Check the number of milligrams (mg) of salt (sodium) in one serving. Most people should limit total sodium intake to less than 2,300 mg per day.  Always check the nutrition information of foods labeled as "low-fat" or "nonfat". These foods may be higher in added sugar or refined carbs and should be avoided.  Talk to your dietitian to identify your daily goals for nutrients listed on the label. Shopping  Avoid buying canned, premade, or processed foods. These foods tend to be high in fat, sodium, and added sugar.  Shop around the outside edge of the grocery store. This includes fresh fruits and vegetables, bulk grains, fresh meats, and fresh dairy. Cooking  Use low-heat cooking methods, such as baking, instead of high-heat cooking methods like deep frying.  Cook using healthy oils, such as olive, canola, or sunflower oil.  Avoid cooking with butter, cream, or high-fat meats. Meal planning  Eat meals and snacks regularly, preferably at the same times every day. Avoid going long periods of time without eating.  Eat foods high in fiber, such as fresh fruits, vegetables, beans, and whole grains. Talk to your dietitian about how many servings of carbs you can eat at each meal.  Eat 4-6 ounces (oz) of lean protein each day, such as lean meat, chicken, fish, eggs, or tofu. One oz of lean protein is equal to: ? 1 oz of meat, chicken, or fish. ? 1 egg. ?  cup of tofu.  Eat some foods each day that contain healthy fats, such as avocado, nuts, seeds, and fish. Lifestyle  Check your blood glucose regularly.  Exercise regularly as told by your health care provider. This may include: ? 150 minutes of moderate-intensity or vigorous-intensity exercise each week. This could be brisk walking,  biking, or water aerobics. ? Stretching and doing strength exercises, such as yoga or weightlifting, at least 2 times a week.  Take medicines as told by your health care provider.  Do not use any products that contain nicotine or tobacco, such as cigarettes and e-cigarettes. If you need help quitting, ask your health care provider.  Work with a Social worker or diabetes educator to identify strategies to manage stress and any emotional and social challenges. Questions to ask a health care provider  Do I need to meet with a diabetes educator?  Do I need to meet with a dietitian?  What number can I call if I have questions?  When are the best times to check my blood glucose? Where to find more information:  American Diabetes Association: diabetes.org  Academy of Nutrition and Dietetics: www.eatright.[employer] of Diabetes and Digestive and Kidney Diseases (NIH): [employer] Summary  A healthy meal plan  will help you control your blood glucose and maintain a healthy lifestyle.  Working with a diet and nutrition specialist (dietitian) can help you make a meal plan that is best for you.  Keep in mind that carbohydrates (carbs) and alcohol have immediate effects on your blood glucose levels. It is important to count carbs and to use alcohol carefully. This information is not intended to replace advice given to you by your health care provider. Make sure you discuss any questions you have with your health care provider. Document Revised: 09/09/2017 Document Reviewed: 11/01/2016 Elsevier Patient Education  2020 [employer].

## 2020-02-11 ENCOUNTER — Emergency Department (HOSPITAL_BASED_OUTPATIENT_CLINIC_OR_DEPARTMENT_OTHER)
Admission: EM | Admit: 2020-02-11 | Discharge: 2020-02-11 | Disposition: A | Discharge status: Home / Self Care | Payer: 59 | Attending: Emergency Medicine | Admitting: Emergency Medicine

## 2020-02-11 ENCOUNTER — Encounter (HOSPITAL_BASED_OUTPATIENT_CLINIC_OR_DEPARTMENT_OTHER): Payer: Self-pay | Admitting: *Deleted

## 2020-02-11 ENCOUNTER — Emergency Department (HOSPITAL_BASED_OUTPATIENT_CLINIC_OR_DEPARTMENT_OTHER): Payer: 59

## 2020-02-11 ENCOUNTER — Other Ambulatory Visit: Payer: Self-pay

## 2020-02-11 DIAGNOSIS — I5032 Chronic diastolic (congestive) heart failure: Secondary | ICD-10-CM | POA: Diagnosis not present

## 2020-02-11 DIAGNOSIS — Z79899 Other long term (current) drug therapy: Secondary | ICD-10-CM | POA: Insufficient documentation

## 2020-02-11 DIAGNOSIS — E1122 Type 2 diabetes mellitus with diabetic chronic kidney disease: Secondary | ICD-10-CM | POA: Diagnosis not present

## 2020-02-11 DIAGNOSIS — R072 Precordial pain: Secondary | ICD-10-CM

## 2020-02-11 DIAGNOSIS — Z888 Allergy status to other drugs, medicaments and biological substances status: Secondary | ICD-10-CM | POA: Diagnosis not present

## 2020-02-11 DIAGNOSIS — N183 Chronic kidney disease, stage 3 unspecified: Secondary | ICD-10-CM | POA: Diagnosis not present

## 2020-02-11 DIAGNOSIS — Z7984 Long term (current) use of oral hypoglycemic drugs: Secondary | ICD-10-CM | POA: Diagnosis not present

## 2020-02-11 DIAGNOSIS — E1169 Type 2 diabetes mellitus with other specified complication: Secondary | ICD-10-CM | POA: Diagnosis not present

## 2020-02-11 DIAGNOSIS — Z9861 Coronary angioplasty status: Secondary | ICD-10-CM | POA: Diagnosis not present

## 2020-02-11 DIAGNOSIS — R0789 Other chest pain: Secondary | ICD-10-CM | POA: Diagnosis present

## 2020-02-11 DIAGNOSIS — J449 Chronic obstructive pulmonary disease, unspecified: Secondary | ICD-10-CM | POA: Diagnosis not present

## 2020-02-11 DIAGNOSIS — I130 Hypertensive heart and chronic kidney disease with heart failure and stage 1 through stage 4 chronic kidney disease, or unspecified chronic kidney disease: Secondary | ICD-10-CM | POA: Insufficient documentation

## 2020-02-11 DIAGNOSIS — E785 Hyperlipidemia, unspecified: Secondary | ICD-10-CM | POA: Insufficient documentation

## 2020-02-11 DIAGNOSIS — R1013 Epigastric pain: Secondary | ICD-10-CM | POA: Diagnosis not present

## 2020-02-11 LAB — HEPATIC FUNCTION PANEL
ALT: 32 U/L (ref 0–44)
AST: 38 U/L (ref 15–41)
Albumin: 4.1 g/dL (ref 3.5–5.0)
Alkaline Phosphatase: 80 U/L (ref 38–126)
Bilirubin, Direct: 0.2 mg/dL (ref 0.0–0.2)
Indirect Bilirubin: 0.2 mg/dL — ABNORMAL LOW (ref 0.3–0.9)
Total Bilirubin: 0.4 mg/dL (ref 0.3–1.2)
Total Protein: 7.4 g/dL (ref 6.5–8.1)

## 2020-02-11 LAB — BASIC METABOLIC PANEL
Anion gap: 11 (ref 5–15)
BUN: 18 mg/dL (ref 6–20)
CO2: 27 mmol/L (ref 22–32)
Calcium: 9.3 mg/dL (ref 8.9–10.3)
Chloride: 98 mmol/L (ref 98–111)
Creatinine, Ser: 1.14 mg/dL — ABNORMAL HIGH (ref 0.44–1.00)
GFR calc Af Amer: >60 mL/min (ref >60)
GFR calc non Af Amer: 52 mL/min — ABNORMAL LOW (ref >60)
Glucose, Bld: 182 mg/dL — ABNORMAL HIGH (ref 70–99)
Potassium: 4.4 mmol/L (ref 3.5–5.1)
Sodium: 136 mmol/L (ref 135–145)

## 2020-02-11 LAB — CBC
HCT: 33.1 % — ABNORMAL LOW (ref 36.0–46.0)
Hemoglobin: 10.5 g/dL — ABNORMAL LOW (ref 12.0–15.0)
MCH: 27.9 pg (ref 26.0–34.0)
MCHC: 31.7 g/dL (ref 30.0–36.0)
MCV: 87.8 fL (ref 80.0–100.0)
Platelets: 340 10*3/uL (ref 150–400)
RBC: 3.77 MIL/uL — ABNORMAL LOW (ref 3.87–5.11)
RDW: 14.5 % (ref 11.5–15.5)
WBC: 14.1 10*3/uL — ABNORMAL HIGH (ref 4.0–10.5)
nRBC: 0 % (ref 0.0–0.2)

## 2020-02-11 LAB — TROPONIN I (HIGH SENSITIVITY)
Troponin I (High Sensitivity): 4 ng/L (ref <18)
Troponin I (High Sensitivity): 3 ng/L (ref <18)

## 2020-02-11 LAB — LIPASE, BLOOD: Lipase: 32 U/L (ref 11–51)

## 2020-02-11 MED ORDER — ALUM & MAG HYDROXIDE-SIMETH 200-200-20 MG/5ML PO SUSP
15.0000 mL | Freq: Once | ORAL | Status: AC
  Administered 2020-02-11: 15 mL via ORAL
  Filled 2020-02-11: qty 30

## 2020-02-11 MED ORDER — PANTOPRAZOLE SODIUM 40 MG PO TBEC
40.0000 mg | DELAYED_RELEASE_TABLET | Freq: Every day | ORAL | 0 refills | Status: DC
Start: 2020-02-11 — End: 2020-02-14

## 2020-02-11 MED ORDER — SODIUM CHLORIDE 0.9% FLUSH
3.0000 mL | Freq: Once | INTRAVENOUS | Status: DC
  Filled 2020-02-11: qty 3

## 2020-02-11 MED ORDER — DICYCLOMINE HCL 10 MG PO CAPS
10.0000 mg | ORAL_CAPSULE | Freq: Once | ORAL | Status: AC
  Administered 2020-02-11: 10 mg via ORAL
  Filled 2020-02-11: qty 1

## 2020-02-11 MED ORDER — DICYCLOMINE HCL 20 MG PO TABS
20.0000 mg | ORAL_TABLET | Freq: Three times a day (TID) | ORAL | 0 refills | Status: DC | PRN
Start: 2020-02-11 — End: 2020-04-01

## 2020-02-11 MED ORDER — IOHEXOL 300 MG/ML  SOLN
100.0000 mL | Freq: Once | INTRAMUSCULAR | Status: AC | PRN
  Administered 2020-02-11: 15:00:00 100 mL via INTRAVENOUS

## 2020-02-11 MED FILL — DICYCLOMINE 20 MG TABLET: 20 | 7 days supply | Qty: 20 | Fill #0

## 2020-02-11 MED FILL — PANTOPRAZOLE SOD DR 40 MG T: 40 | 30 days supply | Qty: 30 | Fill #0

## 2020-02-11 NOTE — ED Provider Notes (Signed)
Emergency Department Provider Note   I have reviewed the triage vital signs and the nursing notes.   HISTORY  Chief Complaint Chest Pain   HPI [name] is a 59 y.o. female with past medical history reviewed below including diabetes, COPD, CHF presents to the emergency department with burning epigastric pain radiating up into the chest.  In the chest she is describing a burning but also pressure sensation with radiation to the left shoulder and arm.  Symptoms began today.  She has no prior history of similar symptoms.  No prior history of ACS.  She denies diaphoresis or nausea.  Her abdominal pain is epigastric to left upper quadrant.  She is having normal bowel movements.  No fevers, chills, body aches.  She is remained compliant with her home medications.  No radiation of symptoms or other modifying factors.   Past Medical History:  Diagnosis Date  . Abnormal SPEP 04/17/2014  . Acute left ankle pain 01/26/2017  . ANEMIA-UNSPECIFIED 09/18/2009  . Anxiety   . Arthritis   . CHF (congestive heart failure) ([place])   . Chronic diastolic heart failure, NYHA class 2 (HCC)    Normal LVEDP by May 2018  . COPD (chronic obstructive pulmonary disease) ([place])   . Depression   . DIABETES MELLITUS, TYPE II 08/21/2006  . Diabetic osteomyelitis ([place]) 05/29/2015  . Fracture of 5th metatarsal    non union  . GERD [id number]   Had fundoplication  . GOUT 08/20/2010  . Hammer toe of right foot    2-5th toes  . Hx of umbilical hernia repair   . HYPERLIPIDEMIA 08/21/2006  . HYPERTENSION 08/21/2006  . Infection of wound due to methicillin resistant Staphylococcus aureus (MRSA)   . Internal hemorrhoids   . Multiple allergies 10/14/2016  . OBESITY 06/04/2009  . Onychomycosis 10/27/2015  . Osteomyelitis of left foot ([place]) 05/29/2015  . Pulmonary sarcoidosis ([place])    Followed locally by pulmonology, but also by Dr. Casper Harrison at [facility]  . Right knee pain 01/26/2017  . Vocal cord  dysfunction   . Wears partial dentures     Patient Active Problem List   Diagnosis Date Noted  . Nontraumatic complete tear of left rotator cuff   . Diabetic neuropathy ([place]) 02/20/2019  . Severe persistent asthma 01/04/2019  . Subcortical microvascular ischemic occlusive disease 07/12/2018  . Rapid palpitations 07/12/2018  . Impingement of left ankle joint 06/26/2018  . Constipation 04/21/2018  . Total knee replacement status, left 12/07/2017  . Dysphagia 09/20/2017  . Epiglottitis   . Plantar fasciitis, right 07/13/2017  . S/P total knee arthroplasty, right 06/29/2017  . Osteoarthrosis, localized, primary, knee, right   . Sprain of calcaneofibular ligament of right ankle 06/06/2017  . Osteoarthritis of right knee 01/26/2017  . Onychomycosis 10/27/2015  . CKD (chronic kidney disease) stage 3, GFR 30-59 ml/min 04/27/2015  . MRSA (methicillin resistant staph aureus) culture positive 03/27/2015  . History of osteomyelitis 03/27/2015  . Fatty liver 09/30/2014  . Hot flashes 07/15/2014  . Abnormal SPEP 04/17/2014  . Fracture of left leg 04/17/2014  . Cushingoid side effect of steroids ([place]) 04/17/2014  . Internal hemorrhoids   . Major depression in full remission ([place])   . Preoperative clearance 03/25/2014  . Obstructive chronic bronchitis without exacerbation ([place]) 09/18/2013  . Chest pain 04/11/2013  . Hypertensive heart disease with chronic diastolic congestive heart failure ([place])   . Solitary pulmonary nodule, on CT 02/2013 - stable over 2 years in 2015  02/20/2013  . Gout 08/20/2010  . Anemia 09/18/2009  . Morbid obesity ([place]) 06/04/2009  . Sleep apnea 04/21/2009  . Sarcoidosis of lung ([place]) 04/10/2007  . Hyperlipidemia associated with type 2 diabetes mellitus ([place]) 08/21/2006  . Hypertension associated with diabetes ([place]) 08/21/2006  . GERD 08/21/2006  . Type II diabetes mellitus with renal manifestations ([place]) 08/21/2006    Past Surgical History:  Procedure  Laterality Date  . ABDOMINAL HYSTERECTOMY    . APPENDECTOMY    . BLADDER SUSPENSION  11/11/2011   Procedure: TRANSVAGINAL TAPE (TVT) PROCEDURE;  Surgeon: Olga Millers, MD;  Location: [facility];  Service: Gynecology;  Laterality: N/A;  . CAROTIDS  02/18/11   CAROTID DUPLEX; VERTEBRALS ARE PATENT WITH ANTEGRADE FLOW. ICA/CCA RATIO 1.61 ON RIGHT AND 0.75 ON LEFT  . CHOLECYSTECTOMY  1984  . CYSTOSCOPY  11/11/2011   Procedure: CYSTOSCOPY;  Surgeon: Olga Millers, MD;  Location: [facility];  Service: Gynecology;  Laterality: N/A;  . EXTUBATION (ENDOTRACHEAL) IN OR N/A 09/23/2017   Procedure: EXTUBATION (ENDOTRACHEAL) IN OR;  Surgeon: Helayne Seminole, MD;  Location: [facility];  Service: ENT;  Laterality: N/A;  . FIBEROPTIC LARYNGOSCOPY AND TRACHEOSCOPY N/A 09/23/2017   Procedure: FLEXIBLE FIBEROPTIC LARYNGOSCOPY;  Surgeon: Helayne Seminole, MD;  Location: [facility];  Service: ENT;  Laterality: N/A;  . FRACTURE SURGERY     foot  . HALLUX FUSION Left 10/02/2018   Procedure: HALLUX ARTHRODESIS;  Surgeon: Edrick Kins, DPM;  Location: [facility];  Service: Podiatry;  Laterality: Left;  . HAMMER TOE SURGERY Left 10/02/2018   Procedure: HAMMER TOE CORRECTION 2ND 3RD 4RD FIFTH TOE;  Surgeon: Edrick Kins, DPM;  Location: [facility];  Service: Podiatry;  Laterality: Left;  . HAMMER TOE SURGERY Right 04/12/2019   Procedure: HAMMER TOE CORRECTION, 2ND, 3RD, 4TH AND 5TH TOES OF RIGHT FOOT;  Surgeon: Edrick Kins, DPM;  Location: [facility];  Service: Podiatry;  Laterality: Right;  . HAMMER TOE SURGERY Left 10/25/2019   Procedure: HAMMER TOE REPAIR SECOND, THIRD, FOUTH;  Surgeon: Edrick Kins, DPM;  Location: [facility];  Service: Podiatry;  Laterality: Left;  . HERNIA REPAIR    . I & D EXTREMITY Left 06/27/2015   Procedure: Partial Excision Left Calcaneus, Place Antibiotic Beads, and Wound VAC;  Surgeon: Newt Minion, MD;  Location: [facility];  Service: Orthopedics;  Laterality: Left;  . JOINT REPLACEMENT    . KNEE ARTHROSCOPY      right  . LEFT AND RIGHT HEART CATHETERIZATION WITH CORONARY ANGIOGRAM N/A 04/23/2013   Procedure: LEFT AND RIGHT HEART CATHETERIZATION WITH CORONARY ANGIOGRAM;  Surgeon: Leonie Man, MD;  Location: [facility];  Service: Cardiovascular;  Laterality: N/A;  . LEFT HEART CATH AND CORONARY ANGIOGRAPHY N/A 03/11/2017   Procedure: Left Heart Cath and Coronary Angiography;  Surgeon: Sherren Mocha, MD;  Location: [facility]; angiographically minimal CAD in the LAD otherwise normal.;  Normal LVEDP.  FALSE POSITIVE MYOVIEW  . LEFT HEART CATH AND CORONARY ANGIOGRAPHY  07/20/2010   LVEF 50-55% WITH VERY MILD GLOBAL HYPOKINESIA; ESSENTIALLY NORMAL CORONARY ARTERIES; NORMAL LV FUNCTION  . METATARSAL OSTEOTOMY WITH OPEN REDUCTION INTERNAL FIXATION (ORIF) METATARSAL WITH FUSION Left 04/09/2014   Procedure: LEFT FOOT FRACTURE OPEN TREATMENT METATARSAL INCLUDES INTERNAL FIXATION EACH;  Surgeon: Lorn Junes, MD;  Location: [facility];  Service: Orthopedics;  Laterality: Left;  . NISSEN FUNDOPLICATION  [id number]  . NM MYOVIEW LTD  03/09/2013   Lexiscan -->  EF 50%; NORMAL MYOCARDIAL PERFUSION STUDY - breast attenuation  . NM MYOVIEW LTD  03/10/2017   : Moderate size "stress-induced "perfusion defect at the apex as well as "ill-defined stress-induced perfusion defect in the lateral wall.  EF 55%.  INTERMEDIATE risk. -->  FALSE POSITIVE  . Right and left CARDIAC CATHETERIZATION  04/23/2013   Angiographic normal coronaries; LVEDP 20 mmHg, PCWP 12-14 mmHg, RAP 12 mmHg.; Fick CO/CI 4.9/2.2  . SHOULDER ARTHROSCOPY Left 03/14/2019   Procedure: LEFT SHOULDER ARTHROSCOPY, DEBRIDEMENT, AND DECOMPRESSION;  Surgeon: Newt Minion, MD;  Location: [facility];  Service: Orthopedics;  Laterality: Left;  . TOTAL KNEE ARTHROPLASTY Right 06/29/2017   Procedure: RIGHT TOTAL KNEE ARTHROPLASTY;  Surgeon: Newt Minion, MD;  Location: [facility];  Service: Orthopedics;  Laterality: Right;  . TOTAL KNEE ARTHROPLASTY Left  12/07/2017  . TOTAL KNEE ARTHROPLASTY Left 12/07/2017   Procedure: LEFT TOTAL KNEE ARTHROPLASTY;  Surgeon: Newt Minion, MD;  Location: [facility];  Service: Orthopedics;  Laterality: Left;  . TRACHEOSTOMY TUBE PLACEMENT N/A 09/20/2017   Procedure: AWAKE INTUBATION WITH ANESTHESIA WITH VIDEO ASSISTANCE;  Surgeon: Helayne Seminole, MD;  Location: [facility];  Service: ENT;  Laterality: N/A;  . TRANSTHORACIC ECHOCARDIOGRAM  08/2014   Normal LV size and function.  Mild LVH.  EF 55-60%.  Normal regional wall motion.  GR 1 DD.  Normal RV size and function .  [name] TUBAL LIGATION     with reversal in 1994  . VENTRAL HERNIA REPAIR      Allergies Methotrexate, Vancomycin, Lisinopril, Chlorhexidine, Clindamycin/lincomycin, Doxycycline, and Teflaro [ceftaroline]  Family History  Problem Relation Age of Onset  . Diabetes Father   . Heart attack Father 8  . Coronary artery disease Father   . Heart failure Father   . Throat cancer Father   . COPD Mother   . Emphysema Mother   . Asthma Mother   . Heart failure Mother   . Breast cancer Mother   . Heart attack Maternal Grandfather   . Sarcoidosis Maternal Uncle   . Lung cancer Brother   . Diabetes Brother   . Colon cancer Neg Hx   . Rectal cancer Neg Hx     Social History Social History   Tobacco Use  . Smoking status: Never Smoker  . Smokeless tobacco: Never Used  Substance Use Topics  . Alcohol use: No    Alcohol/week: 0.0 standard drinks  . Drug use: No    Review of Systems  Constitutional: No fever/chills Eyes: No visual changes. ENT: No sore throat. Cardiovascular: Positive chest pain. Respiratory: Denies shortness of breath. Gastrointestinal: Positive epigastric abdominal pain.  No nausea, no vomiting.  No diarrhea.  No constipation. Genitourinary: Negative for dysuria. Musculoskeletal: Negative for back pain. Positive left arm pain.  Skin: Negative for rash. Neurological: Negative for headaches, focal weakness or  numbness.  10-point ROS otherwise negative.  ____________________________________________   PHYSICAL EXAM:  VITAL SIGNS: ED Triage Vitals  Enc Vitals Group     BP 02/11/20 1415 137/60     Pulse Rate 02/11/20 1415 73     Resp 02/11/20 1415 20     Temp 02/11/20 1415 98 F (36.7 C)     Temp Source 02/11/20 1415 Oral     SpO2 02/11/20 1415 100 %     Weight 02/11/20 1412 242 lb 15.9 oz (110.2 kg)     Height 02/11/20 1412 5\' 6"  (1.676 m)   Constitutional: Alert and oriented. Well appearing and in  no acute distress. Eyes: Conjunctivae are normal.  Head: Atraumatic. Nose: No congestion/rhinnorhea. Mouth/Throat: Mucous membranes are moist.   Neck: No stridor.   Cardiovascular: Normal rate, regular rhythm. Good peripheral circulation. Grossly normal heart sounds.   Respiratory: Normal respiratory effort.  No retractions. Lungs CTAB. Gastrointestinal: Soft with mild tenderness in the midepigastric and left upper quadrant.  No rebound or guarding. No distention.  Musculoskeletal: No lower extremity tenderness nor edema. No gross deformities of extremities. Neurologic:  Normal speech and language. No gross focal neurologic deficits are appreciated.  Skin:  Skin is warm, dry and intact. No rash noted.  ____________________________________________   LABS (all labs ordered are listed, but only abnormal results are displayed)  Labs Reviewed  BASIC METABOLIC PANEL - Abnormal; Notable for the following components:      Result Value   Glucose, Bld 182 (*)    Creatinine, Ser 1.14 (*)    GFR calc non Af Amer 52 (*)    All other components within normal limits  CBC - Abnormal; Notable for the following components:   WBC 14.1 (*)    RBC 3.77 (*)    Hemoglobin 10.5 (*)    HCT 33.1 (*)    All other components within normal limits  HEPATIC FUNCTION PANEL - Abnormal; Notable for the following components:   Indirect Bilirubin 0.2 (*)    All other components within normal limits  LIPASE,  BLOOD  TROPONIN I (HIGH SENSITIVITY)  TROPONIN I (HIGH SENSITIVITY)   ____________________________________________  EKG   EKG Interpretation  Date/Time:  Monday Feb 11 2020 14:13:18 EDT Ventricular Rate:  73 PR Interval:  172 QRS Duration: 80 QT Interval:  376 QTC Calculation: 414 R Axis:   55 Text Interpretation: Normal sinus rhythm Low voltage QRS Cannot rule out Anterior infarct , age undetermined Abnormal ECG No significant change since prior 2/18 Confirmed by Aletta Edouard [phone]) on 02/11/2020 2:28:04 PM       ____________________________________________  RADIOLOGY  DG Chest 2 View  Result Date: 02/11/2020 CLINICAL DATA:  Chest pain. Chest heaviness with radiation into back. History of hypertension, diabetes, COPD, CHF and sarcoidosis. EXAM: CHEST - 2 VIEW COMPARISON:  Prior chest radiographs 08/23/2018 and earlier. FINDINGS: Heart size within normal limits. No appreciable airspace consolidation. No evidence of pleural effusion or pneumothorax. No acute bony abnormality is identified. Surgical clips project in the region of the GE junction. IMPRESSION: No evidence of acute cardiopulmonary abnormality. Electronically Signed   By: Kellie Simmering DO   On: 02/11/2020 14:36   CT ABDOMEN PELVIS W CONTRAST  Result Date: 02/11/2020 CLINICAL DATA:  Upper abdominal pain radiating into the back. History of congestive heart failure and sarcoidosis. Abdominal abscess/infection suspected. EXAM: CT ABDOMEN AND PELVIS WITH CONTRAST TECHNIQUE: Multidetector CT imaging of the abdomen and pelvis was performed using the standard protocol following bolus administration of intravenous contrast. CONTRAST:  175mL OMNIPAQUE IOHEXOL 300 MG/ML  SOLN COMPARISON:  CT 02/09/2018. Ultrasound 01/31/2020. FINDINGS: Lower chest: Clear lung bases. No significant pleural or pericardial effusion. Hepatobiliary: The density of the liver is within normal limits, and no focal hepatic abnormalities are identified. No  significant biliary dilatation post cholecystectomy. Pancreas: Fatty replaced. No focal abnormality, surrounding inflammation or ductal dilatation. Spleen: Normal in size without focal abnormality. Adrenals/Urinary Tract: Both adrenal glands appear normal. The kidneys appear normal without evidence of urinary tract calculus, suspicious lesion or hydronephrosis. No bladder abnormalities are seen. Stomach/Bowel: No evidence of bowel wall thickening, distention or surrounding inflammatory change. There  are postsurgical changes in the gastric cardia. The appendix is surgically absent. Vascular/Lymphatic: There are no enlarged abdominal or pelvic lymph nodes. Aortic and branch vessel atherosclerosis. No acute vascular findings. The portal, superior mesenteric and splenic veins are patent. Reproductive: Hysterectomy. No adnexal mass. Other: Stable postsurgical changes from previous umbilical hernia repair. No recurrent hernia. No ascites or focal extraluminal fluid collection. Musculoskeletal: No acute or significant osseous findings. IMPRESSION: 1. No acute findings or explanation for the patient's symptoms. No evidence of abdominal abscess or infection. 2. Postsurgical changes as described. 3.  Aortic Atherosclerosis (ICD10-I70.0). Electronically Signed   By: Richardean Sale M.D.   On: 02/11/2020 15:38    ____________________________________________   PROCEDURES  Procedure(s) performed:   Procedures  None  ____________________________________________   INITIAL IMPRESSION / ASSESSMENT AND PLAN / ED COURSE  Pertinent labs & imaging results that were available during my care of the patient were reviewed by me and considered in my medical decision making (see chart for details).   Patient presents to the emergency department for evaluation of epigastric abdominal pain radiating up to the chest and left shoulder.  This presentation seems most consistent with GI pathology but patient does have several risk  factors for ACS.  The pain in the chest is burning but also pressure and radiating to the shoulder.  The patient's EKG is interpreted as above with no acute ischemic process.  Plan for Maalox.  Troponin is pending.  Initial CBC shows elevated white blood cell count of 14 with no clear infection symptoms or obvious source.  Patient does have some epigastric and left upper quadrant tenderness on exam.  Plan for CT abdomen pelvis and reassess.  04:57 PM  Patient's repeat troponin remains low at 3.  Her CT scan shows no clear infection signs or symptoms.  No reason for her abdominal pain.  She continues to have a burning/heavy feeling in her abdomen and chest.  Plan to add Protonix to her famotidine and Bentyl.  She was instructed to call her primary care doctor as well as her cardiologist first thing tomorrow.  We discussed very strict ED return precautions.  Doubt ACS clinically especially after normal blood work here and unchanged EKG since 2018.  ____________________________________________  FINAL CLINICAL IMPRESSION(S) / ED DIAGNOSES  Final diagnoses:  Epigastric abdominal pain  Precordial chest pain     MEDICATIONS GIVEN DURING THIS VISIT:  Medications  alum & mag hydroxide-simeth (MAALOX/MYLANTA) 200-200-20 MG/5ML suspension 15 mL (15 mLs Oral Given 02/11/20 1529)  iohexol (OMNIPAQUE) 300 MG/ML solution 100 mL (100 mLs Intravenous Contrast Given 02/11/20 1515)  dicyclomine (BENTYL) capsule 10 mg (10 mg Oral Given 02/11/20 1605)     NEW OUTPATIENT MEDICATIONS STARTED DURING THIS VISIT:  Discharge Medication List as of 02/11/2020  4:56 PM    START taking these medications   Details  dicyclomine (BENTYL) 20 MG tablet Take 1 tablet (20 mg total) by mouth 3 (three) times daily as needed for spasms., Starting Mon 02/11/2020, Normal    pantoprazole (PROTONIX) 40 MG tablet Take 1 tablet (40 mg total) by mouth daily., Starting Mon 02/11/2020, Until Wed 03/12/2020, Normal        Note:  This document  was prepared using Dragon voice recognition software and may include unintentional dictation errors.  Nanda Quinton, MD, [facility]    Shakeerah Gradel, [facility], MD 02/12/20 1310

## 2020-02-11 NOTE — ED Triage Notes (Signed)
Chest heaviness with radiation into her back. Hx CHF and sarcoidosis.

## 2020-02-11 NOTE — Discharge Instructions (Signed)
You were seen in the emergency department today with chest discomfort and abdominal pain.  Your CT scan and lab work is reassuring.  Your heart attack labs were normal and EKG is reassuring.  Please call your cardiologist and primary care doctor tomorrow.  If you continue to have this discomfort you may require additional testing and possible referral to a gastroenterologist.  I have added an antacid medication as well as an abdominal cramping type medicine.  If your chest pain continues or suddenly worsens or you develop new symptoms you should return to the emergency department immediately for evaluation.

## 2020-02-12 ENCOUNTER — Telehealth: Payer: Self-pay | Admitting: Family Medicine

## 2020-02-12 NOTE — Telephone Encounter (Signed)
Patient was in the ED yesterday.  She needs to follow up with Dr. Yong Channel.    I offered patient appointment this afternoon but patient could not come this afternoon.  Is there another day we could work patient in?

## 2020-02-13 NOTE — Telephone Encounter (Signed)
Can you call and see if she can come in one of the same days for tomorrow?

## 2020-02-13 NOTE — Telephone Encounter (Signed)
Patient has been scheduled for 02/14/20 at 2:20 pm

## 2020-02-14 ENCOUNTER — Encounter: Payer: Self-pay | Admitting: Family Medicine

## 2020-02-14 ENCOUNTER — Ambulatory Visit: Payer: 59 | Admitting: Family Medicine

## 2020-02-14 ENCOUNTER — Other Ambulatory Visit: Payer: Self-pay

## 2020-02-14 ENCOUNTER — Ambulatory Visit (INDEPENDENT_AMBULATORY_CARE_PROVIDER_SITE_OTHER): Payer: 59 | Admitting: Family Medicine

## 2020-02-14 VITALS — BP 122/58 | HR 78 | Temp 97.2°F | Ht 66.0 in | Wt 246.8 lb

## 2020-02-14 DIAGNOSIS — I700 Atherosclerosis of aorta: Secondary | ICD-10-CM | POA: Insufficient documentation

## 2020-02-14 DIAGNOSIS — I10 Essential (primary) hypertension: Secondary | ICD-10-CM

## 2020-02-14 DIAGNOSIS — M545 Low back pain, unspecified: Secondary | ICD-10-CM

## 2020-02-14 DIAGNOSIS — N3000 Acute cystitis without hematuria: Secondary | ICD-10-CM

## 2020-02-14 DIAGNOSIS — N183 Chronic kidney disease, stage 3 unspecified: Secondary | ICD-10-CM

## 2020-02-14 DIAGNOSIS — I152 Hypertension secondary to endocrine disorders: Secondary | ICD-10-CM

## 2020-02-14 DIAGNOSIS — E1159 Type 2 diabetes mellitus with other circulatory complications: Secondary | ICD-10-CM

## 2020-02-14 LAB — POC URINALSYSI DIPSTICK (AUTOMATED)
Bilirubin, UA: NEGATIVE
Blood, UA: NEGATIVE
Glucose, UA: NEGATIVE
Ketones, UA: NEGATIVE
Nitrite, UA: POSITIVE
Protein, UA: NEGATIVE
Spec Grav, UA: 1.015 (ref 1.010–1.025)
Urobilinogen, UA: 0.2 E.U./dL
pH, UA: 6.5 (ref 5.0–8.0)

## 2020-02-14 MED ORDER — CEPHALEXIN 500 MG PO CAPS
500.0000 mg | ORAL_CAPSULE | Freq: Two times a day (BID) | ORAL | 0 refills | Status: AC
Start: 2020-02-14 — End: 2020-02-21

## 2020-02-14 MED ORDER — DEXILANT 60 MG PO CPDR: 60.0000 mg | DELAYED_RELEASE_CAPSULE | Freq: Every day | ORAL | 5 refills | Status: DC

## 2020-02-14 NOTE — Progress Notes (Signed)
Phone [phone] In person visit   Subjective:   [name] is a 59 y.o. year old very pleasant female patient who presents for/with See problem oriented charting Chief Complaint  Patient presents with  . Follow-up   This visit occurred during the SARS-CoV-2 public health emergency.  Safety protocols were in place, including screening questions prior to the visit, additional usage of staff PPE, and extensive cleaning of exam room while observing appropriate contact time as indicated for disinfecting solutions.   Past Medical History-  Patient Active Problem List   Diagnosis Date Noted  . Severe persistent asthma 01/04/2019    Priority: High  . Subcortical microvascular ischemic occlusive disease 07/12/2018    Priority: High  . Obstructive chronic bronchitis without exacerbation ([place]) 09/18/2013    Priority: High  . Chest pain 04/11/2013    Priority: High  . Hypertensive heart disease with chronic diastolic congestive heart failure ([place])     Priority: High  . Sarcoidosis of lung ([place]) 04/10/2007    Priority: High  . Type II diabetes mellitus with renal manifestations ([place]) 08/21/2006    Priority: High  . Constipation 04/21/2018    Priority: Medium  . Epiglottitis     Priority: Medium  . Osteoarthritis of right knee 01/26/2017    Priority: Medium  . CKD (chronic kidney disease) stage 3, GFR 30-59 ml/min 04/27/2015    Priority: Medium  . History of osteomyelitis 03/27/2015    Priority: Medium  . Fatty liver 09/30/2014    Priority: Medium  . Major depression in full remission ([place])     Priority: Medium  . Gout 08/20/2010    Priority: Medium  . Anemia 09/18/2009    Priority: Medium  . Sleep apnea 04/21/2009    Priority: Medium  . Hyperlipidemia associated with type 2 diabetes mellitus ([place]) 08/21/2006    Priority: Medium  . Hypertension associated with diabetes ([place]) 08/21/2006    Priority: Medium  . Nontraumatic complete tear of left rotator cuff    Priority: Low  . Diabetic neuropathy ([place]) 02/20/2019    Priority: Low  . Rapid palpitations 07/12/2018    Priority: Low  . Impingement of left ankle joint 06/26/2018    Priority: Low  . Total knee replacement status, left 12/07/2017    Priority: Low  . Dysphagia 09/20/2017    Priority: Low  . Plantar fasciitis, right 07/13/2017    Priority: Low  . S/P total knee arthroplasty, right 06/29/2017    Priority: Low  . Osteoarthrosis, localized, primary, knee, right     Priority: Low  . Sprain of calcaneofibular ligament of right ankle 06/06/2017    Priority: Low  . Onychomycosis 10/27/2015    Priority: Low  . MRSA (methicillin resistant staph aureus) culture positive 03/27/2015    Priority: Low  . Hot flashes 07/15/2014    Priority: Low  . Abnormal SPEP 04/17/2014    Priority: Low  . Fracture of left leg 04/17/2014    Priority: Low  . Cushingoid side effect of steroids ([place]) 04/17/2014    Priority: Low  . Internal hemorrhoids     Priority: Low  . Preoperative clearance 03/25/2014    Priority: Low  . Solitary pulmonary nodule, on CT 02/2013 - stable over 2 years in 2015 02/20/2013    Priority: Low  . Morbid obesity ([place]) 06/04/2009    Priority: Low  . GERD 08/21/2006    Priority: Low  . Aortic atherosclerosis ([place]) 02/14/2020    Medications- reviewed and updated Current Outpatient  Medications  Medication Sig Dispense Refill  . albuterol (PROVENTIL HFA;VENTOLIN HFA) 108 (90 Base) MCG/ACT inhaler Inhale 1-2 puffs into the lungs every 6 (six) hours as needed for wheezing or shortness of breath. 8 g 5  . allopurinol (ZYLOPRIM) 100 MG tablet Take 100 mg by mouth every evening.     [name] aspirin EC 81 MG tablet Take 81 mg by mouth daily.     [name] atorvastatin (LIPITOR) 40 MG tablet TAKE 1 TABLET BY MOUTH EVERY DAY 90 tablet 3  . BREO ELLIPTA 200-25 MCG/INH AEPB TAKE 1 PUFF BY MOUTH EVERY DAY 60 each 5  . buPROPion (WELLBUTRIN XL) 300 MG 24 hr tablet TAKE 1 TABLET BY MOUTH EVERY DAY 90  tablet 3  . carvedilol (COREG) 25 MG tablet TAKE 1 TABLET (25 MG TOTAL) BY MOUTH 2 (TWO) TIMES DAILY WITH A MEAL. 180 tablet 3  . chlorpheniramine-HYDROcodone (TUSSIONEX PENNKINETIC ER) 10-8 MG/5ML SUER Take 5 mLs by mouth 2 (two) times daily. 140 mL 0  . cholecalciferol (VITAMIN D) 25 MCG (1000 UT) tablet Take 1,000 Units by mouth 2 (two) times a day.    [name] dexlansoprazole (DEXILANT) 60 MG capsule Take 1 capsule (60 mg total) by mouth daily. 30 capsule 5  . diclofenac Sodium (VOLTAREN) 1 % GEL Apply 1 application topically 4 (four) times daily as needed (pain.).    [name] dicyclomine (BENTYL) 20 MG tablet Take 1 tablet (20 mg total) by mouth 3 (three) times daily as needed for spasms. 20 tablet 0  . diltiazem (CARDIZEM CD) 120 MG 24 hr capsule TAKE 1 CAPSULE BY MOUTH EVERY DAY 90 capsule 0  . Dulaglutide (TRULICITY) 1.5 0000000 SOPN Inject 1.5 mg into the skin once a week. 4 pen 11  . famotidine (PEPCID) 20 MG tablet Take 1 tablet (20 mg total) by mouth at bedtime. 30 tablet 3  . fenofibrate 160 MG tablet TAKE 1 TABLET BY MOUTH EVERY DAY 90 tablet 1  . fluticasone (FLONASE) 50 MCG/ACT nasal spray Place 2 sprays into both nostrils daily as needed for allergies. 48 mL 3  . furosemide (LASIX) 40 MG tablet TAKE 1 TABLET BY MOUTH TWICE A DAY 180 tablet 3  . gabapentin (NEURONTIN) 100 MG capsule TAKE 1 CAPSULE BY MOUTH THREE TIMES A DAY 270 capsule 3  . glucose blood (CONTOUR NEXT TEST) test strip 1 each by Other route 4 (four) times daily. And lancets 4/day 400 each 3  . icosapent Ethyl (VASCEPA) 1 g capsule Take 2 capsules (2 g total) by mouth 2 (two) times daily. 120 capsule 11  . insulin glargine (LANTUS SOLOSTAR) 100 UNIT/ML Solostar Pen Inject 40 Units into the skin every morning. 18 mL 2  . insulin lispro (HUMALOG KWIKPEN) 100 UNIT/ML KwikPen Inject 0.2 mLs (20 Units total) into the skin daily with supper. 15 mL   . Insulin Pen Needle 33G X 4 MM MISC 1 each by Does not apply route 4 (four) times  daily. 200 each 11  . isosorbide mononitrate (IMDUR) 30 MG 24 hr tablet TAKE 1 TABLET (30 MG TOTAL) BY MOUTH AT BEDTIME. 90 tablet 1  . KLOR-CON M20 20 MEQ tablet TAKE 1 & 1/2 TABLETS BY MOUTH TWICE A DAY 270 tablet 2  . lactulose (CHRONULAC) 10 GM/15ML solution Take 10 g by mouth daily as needed (constipation).     [name] linaclotide (LINZESS) 290 MCG CAPS capsule Take 1 capsule (290 mcg total) by mouth daily before breakfast. 30 capsule 11  . LORazepam (ATIVAN) 0.5  MG tablet Take 0.5 mg by mouth every 12 (twelve) hours as needed for anxiety.    . metFORMIN (GLUCOPHAGE) 1000 MG tablet TAKE 1 TABLET BY MOUTH 2 TIMES DAILY WITH A MEAL. MUST CALL TO SCHEDULE AN APPT. 180 tablet 1  . Microlet Lancets MISC 1 each by Does not apply route 2 (two) times daily. E11.9 100 each 2  . nitroGLYCERIN (NITROSTAT) 0.4 MG SL tablet Place 1 tablet (0.4 mg total) under the tongue every 5 (five) minutes as needed for chest pain. Reported on 01/05/2016 25 tablet 6  . ondansetron (ZOFRAN-ODT) 4 MG disintegrating tablet Take 1 tablet (4 mg total) by mouth every 8 (eight) hours as needed for nausea or vomiting. 50 tablet 2  . oxyCODONE-acetaminophen (PERCOCET) 5-325 MG tablet Take 1 tablet by mouth every 6 (six) hours as needed for severe pain. 30 tablet 0  . PARoxetine Mesylate 7.5 MG CAPS Take 7.5 mg by mouth daily. 90 capsule 3  . predniSONE (DELTASONE) 5 MG tablet Take 5 mg by mouth daily with breakfast.    . telmisartan (MICARDIS) 20 MG tablet TAKE 1 TABLET BY MOUTH EVERY DAY 90 tablet 1  . triamcinolone cream (KENALOG) 0.1 % Apply 1 application topically 2 (two) times daily as needed (rash/irritation.).    [name] venlafaxine XR (EFFEXOR-XR) 75 MG 24 hr capsule TAKE 1 CAPSULE BY MOUTH TWICE A DAY 180 capsule 3  . cephALEXin (KEFLEX) 500 MG capsule Take 1 capsule (500 mg total) by mouth 2 (two) times daily for 7 days. 14 capsule 0   No current facility-administered medications for this visit.     Objective:  BP (!) 122/58    Pulse 78   Temp (!) 97.2 F (36.2 C)   Ht 5\' 6"  (1.676 m)   Wt 246 lb 12.8 oz (111.9 kg)   SpO2 97%   BMI 39.83 kg/m  Gen: NAD, resting comfortably CV: RRR no murmurs rubs or gallops Lungs: CTAB no crackles, wheeze, rhonchi Abdomen: soft/mild diffuse tenderness/nondistended/normal bowel sounds. No rebound or guarding.  Ext: no edema Skin: warm, dry  Results for orders placed or performed in visit on 02/14/20 (from the past 24 hour(s))  POCT Urinalysis Dipstick (Automated)     Status: Abnormal   Collection Time: 02/14/20  2:20 PM  Result Value Ref Range   Color, UA Yellow    Clarity, UA Clear    Glucose, UA Negative Negative   Bilirubin, UA Negative    Ketones, UA Negative    Spec Grav, UA 1.015 1.010 - 1.025   Blood, UA Negative    pH, UA 6.5 5.0 - 8.0   Protein, UA Negative Negative   Urobilinogen, UA 0.2 0.2 or 1.0 E.U./dL   Nitrite, UA Positive    Leukocytes, UA Small (1+) (A) Negative   *Note: Due to a large number of results and/or encounters for the requested time period, some results have not been displayed. A complete set of results can be found in Results Review.      Assessment and Plan  ED F/U epigastric abdominal Pain S: Patient was seen in the emergency room November 14, 2019.  She complained of epigastric abdominal pain radiating up to the chest and left shoulder.  ED provider thought most likely GI pathology but due to risk factors for ACS-EKG and troponins were obtained-EKG unchanged from 2018.  They treated her with Maalox-slight help.  Patient did have an elevated white blood count of 14,000 so ultimately CT abdomen pelvis was obtained.  Troponins were not elevated x2.  Patient ultimately was started on Protonix in addition to her Pepcid.  Lipase was not elevated.  LFTs were normal.  Stable anemia noted-thought to be related to chronic disease.  Mild decrease in kidney function-with GFR at 52   Impression as below "1. No acute findings or explanation for the  patient's symptoms. No evidence of abdominal abscess or infection. 2. Postsurgical changes as described. 3.  Aortic Atherosclerosis (ICD10-I70.0)."  Patient states chest pain has resolved but still has lingering abdominal pain- bentyl does help- she is taking protonix in addition to the dexilant- we are going to have her stop the protonix.  She is concerned about urinary tract infection due to low back pain and discomfort that started 1 to 2 days ago. -also has noted in addition to diffuse abdominal pain frequent urination. No dysuria. No fever/chills A/P: 59 year old female with diffuse abdominal pain-CT abdomen pelvis was reassuring in the emergency room.  She does have a newer symptom of low back pain in addition to ongoing diffuse abdominal pain.  Her chest pain has resolved.  I do not think we need further cardiac work-up at present.  Her initial urine does show nitrites so this may be a UTI-we will treat for UTI and if she fails to improve she will let us know-could consider GI referral (already sees Dr. [name]) -keflex 500mg  BID for 7 days renally adjusted  -She knows to let us know if she has new or worsening symptoms or fails to improve-consider Dr. [name] referral as above -Also getting urine culture   Aortic atherosclerosis ([place]) Incidental finding on prior CT.  Need to continue to modify risk factors for chronic disease processes such as hypertension and hyperlipidemia.   # hypertension//CKD stage III S: compliant with Lasix 40 mg twice a day, diltiazem extended release 120 mg, carvedilol 25 mg BID, telmisartan 20mg  daily, imdur 30mg   Renal function stable with GFR of 52 A/P: Hypertension is well controlled.  Continue current medication.  CKD stage III was stable in the emergency room with GFR in the 50s range.  We will need to renally adjust her UTI medication to Keflex 500 mg twice daily due to GFR under 60.  Recommended follow up:  Future Appointments  Date Time Provider  [place]  04/30/2020  9:15 AM Renato Shin, MD [facility] None  05/27/2020  2:40 PM Yong Channel Brayton Mars, MD [facility]    Lab/Order associations:   ICD-10-CM   1. Low back pain, unspecified back pain laterality, unspecified chronicity, unspecified whether sciatica present  M54.5 POCT Urinalysis Dipstick (Automated)    Urine Culture  2. Aortic atherosclerosis ([facility])  I70.0     Meds ordered this encounter  Medications  . cephALEXin (KEFLEX) 500 MG capsule    Sig: Take 1 capsule (500 mg total) by mouth 2 (two) times daily for 7 days.    Dispense:  14 capsule    Refill:  0    Return precautions advised.  Garret Reddish, MD

## 2020-02-14 NOTE — Patient Instructions (Addendum)
Stop Protonix and continue Dexilant.  Start Keflex twice a day for 14 days, if not improving can see Dr. [name].  Keep scheduled visit in August.

## 2020-02-14 NOTE — Assessment & Plan Note (Signed)
Incidental finding on prior CT.  Need to continue to modify risk factors for chronic disease processes such as hypertension and hyperlipidemia.

## 2020-02-16 LAB — URINE CULTURE
MICRO NUMBER:: 10447613
SPECIMEN QUALITY:: ADEQUATE

## 2020-02-21 ENCOUNTER — Other Ambulatory Visit: Payer: Self-pay | Admitting: Endocrinology

## 2020-02-22 ENCOUNTER — Other Ambulatory Visit: Payer: Self-pay

## 2020-02-22 DIAGNOSIS — Z794 Long term (current) use of insulin: Secondary | ICD-10-CM

## 2020-02-22 DIAGNOSIS — E1129 Type 2 diabetes mellitus with other diabetic kidney complication: Secondary | ICD-10-CM

## 2020-02-22 DIAGNOSIS — E1140 Type 2 diabetes mellitus with diabetic neuropathy, unspecified: Secondary | ICD-10-CM

## 2020-02-22 DIAGNOSIS — IMO0002 Reserved for concepts with insufficient information to code with codable children: Secondary | ICD-10-CM

## 2020-02-22 MED ORDER — PEN NEEDLES 32G X 4 MM MISC: 1.0000 | Freq: Four times a day (QID) | 0 refills | Status: AC

## 2020-02-22 MED ORDER — INSULIN PEN NEEDLE 33G X 4 MM MISC: 1.0000 | Freq: Four times a day (QID) | 2 refills | Status: DC

## 2020-03-05 ENCOUNTER — Other Ambulatory Visit: Payer: Self-pay | Admitting: Family Medicine

## 2020-03-31 ENCOUNTER — Other Ambulatory Visit (INDEPENDENT_AMBULATORY_CARE_PROVIDER_SITE_OTHER): Payer: Self-pay | Admitting: Bariatrics

## 2020-03-31 ENCOUNTER — Other Ambulatory Visit: Payer: Self-pay

## 2020-03-31 ENCOUNTER — Encounter (INDEPENDENT_AMBULATORY_CARE_PROVIDER_SITE_OTHER): Payer: Self-pay | Admitting: Bariatrics

## 2020-03-31 ENCOUNTER — Ambulatory Visit (INDEPENDENT_AMBULATORY_CARE_PROVIDER_SITE_OTHER): Payer: 59 | Admitting: Bariatrics

## 2020-03-31 VITALS — BP 130/74 | HR 76 | Temp 98.0°F | Ht 66.0 in | Wt 239.0 lb

## 2020-03-31 DIAGNOSIS — Z1331 Encounter for screening for depression: Secondary | ICD-10-CM

## 2020-03-31 DIAGNOSIS — R5383 Other fatigue: Secondary | ICD-10-CM | POA: Diagnosis not present

## 2020-03-31 DIAGNOSIS — E119 Type 2 diabetes mellitus without complications: Secondary | ICD-10-CM

## 2020-03-31 DIAGNOSIS — Z9189 Other specified personal risk factors, not elsewhere classified: Secondary | ICD-10-CM

## 2020-03-31 DIAGNOSIS — Z6838 Body mass index (BMI) 38.0-38.9, adult: Secondary | ICD-10-CM

## 2020-03-31 DIAGNOSIS — R0602 Shortness of breath: Secondary | ICD-10-CM

## 2020-03-31 DIAGNOSIS — E7800 Pure hypercholesterolemia, unspecified: Secondary | ICD-10-CM

## 2020-03-31 DIAGNOSIS — K760 Fatty (change of) liver, not elsewhere classified: Secondary | ICD-10-CM

## 2020-03-31 DIAGNOSIS — Z0289 Encounter for other administrative examinations: Secondary | ICD-10-CM

## 2020-03-31 DIAGNOSIS — I10 Essential (primary) hypertension: Secondary | ICD-10-CM | POA: Diagnosis not present

## 2020-03-31 NOTE — Progress Notes (Signed)
Chief Complaint:   OBESITY [name] (MR# [id number]) is a 59 y.o. female who presents for evaluation and treatment of obesity and related comorbidities. Current BMI is Body mass index is 38.58 kg/m.[name] has been struggling with her weight for many years and has been unsuccessful in either losing weight, maintaining weight loss, or reaching her healthy weight goal.  [name] is currently in the action stage of change and ready to dedicate time achieving and maintaining a healthier weight. [name] is interested in becoming our patient and working on intensive lifestyle modifications including (but not limited to) diet and exercise for weight loss.  [name] likes to [employer]. She eats takeout 5 times a week. She states she snacks after dinner.  [name]'s habits were reviewed today and are as follows: Her family eats meals together, she thinks her family will eat healthier with her, her desired weight loss is 79 lbs, she has been heavy most of her life, she started gaining weight in the past 10 years, her heaviest weight ever was 300 pounds, she craves chips and sweets, she snacks frequently in the evenings and she frequently makes poor food choices.  Depression Screen [name]'s Food and Mood (modified PHQ-9) score was 2.  Depression screen [facility] 2/9 03/31/2020  Decreased Interest 1  Down, Depressed, Hopeless 0  PHQ - 2 Score 1  Altered sleeping 0  Tired, decreased energy 1  Change in appetite 0  Feeling bad or failure about yourself  0  Trouble concentrating 0  Moving slowly or fidgety/restless 0  Suicidal thoughts 0  PHQ-9 Score 2  Difficult doing work/chores Not difficult at all  Some recent data might be hidden   Subjective:   Other fatigue. Thelma denies daytime somnolence and denies waking up still tired. [name] generally gets 8 hours of sleep per night, and states that she has generally restful sleep. Snoring is not present. Apneic episodes are not present. Epworth Sleepiness Score  is 7.  SOB (shortness of breath) on exertion. [name] notes increasing shortness of breath with certain activities and seems to be worsening over time with weight gain. She notes getting out of breath sooner with activity than she used to. This has gotten worse recently. [name] denies shortness of breath at rest or orthopnea.  Essential hypertension. [name] is taking diltiazem, telmisartan, and Coreg.  BP Readings from Last 3 Encounters:  03/31/20 130/74  02/14/20 (!) 122/58  02/11/20 (!) 137/53   Lab Results  Component Value Date   CREATININE 1.14 (H) 02/11/2020   CREATININE 0.96 01/22/2020   CREATININE 0.97 10/18/2019   Type 2 diabetes mellitus without complication, without long-term current use of insulin ([place]). [name] is taking Humalog, Trulicity, and Humalog with metformin.  Lab Results  Component Value Date   HGBA1C 7.0 (A) 02/07/2020   HGBA1C 9.2 (H) 10/18/2019   HGBA1C 9.9 (H) 07/04/2019   Lab Results  Component Value Date   MICROALBUR 0.9 10/03/2017   LDLCALC 49 03/10/2017   CREATININE 1.14 (H) 02/11/2020   No results found for: INSULIN  Elevated cholesterol. Victory is taking fenofibrate.  Fatty liver. Barabara had an abdominal ultrasound 01/31/2020 showing hepatic steatosis.  Depression screening. [name] had a negative depression screen with a PHQ-9 score of 2.  At risk for heart disease. [name] is at a higher than average risk for cardiovascular disease due to obesity.   Assessment/Plan:   Other fatigue. [name] does feel that her weight is causing her energy to be lower than it  should be. Fatigue may be related to obesity, depression or many other causes. Labs will be ordered, and in the meanwhile, Lasheika will focus on self care including making healthy food choices, increasing physical activity and focusing on stress reduction. EKG 12-Lead, Comprehensive metabolic panel, VITAMIN D 25 Hydroxy (Vit-D Deficiency, Fractures), T3, T4, free, TSH testing ordered today.  SOB  (shortness of breath) on exertion. [name] does feel that she gets out of breath more easily that she used to when she exercises. [name]'s shortness of breath appears to be obesity related and exercise induced. She has agreed to work on weight loss and gradually increase exercise to treat her exercise induced shortness of breath. Will continue to monitor closely. Comprehensive metabolic panel, VITAMIN D 25 Hydroxy (Vit-D Deficiency, Fractures), T3, T4, free, TSH labs ordered today.  Essential hypertension. [name] is working on healthy weight loss and exercise to improve blood pressure control. We will watch for signs of hypotension as she continues her lifestyle modifications. She will continue her medications as directed.  Type 2 diabetes mellitus without complication, without long-term current use of insulin ([place]). Good blood sugar control is important to decrease the likelihood of diabetic complications such as nephropathy, neuropathy, limb loss, blindness, coronary artery disease, and death. Intensive lifestyle modification including diet, exercise and weight loss are the first line of treatment for diabetes. Handout was given on Hypoglycemia. She will stay with Humalog sliding scale and will decrease Lantus to 30 units from 40 units. Comprehensive metabolic panel, Lipid Panel With LDL/HDL Ratio, C-peptide, Microalbumin / creatinine urine ratio labs ordered today.  Elevated cholesterol. Will check lipids.  Fatty liver. [name] will work on weight loss and increasing activity.  Depression screening. [name] had a negative depression screen.  At risk for heart disease. [name] was given approximately 30 minutes of coronary artery disease prevention counseling today. She is 59 y.o. female and has risk factors for heart disease including obesity. We discussed intensive lifestyle modifications today with an emphasis on specific weight loss instructions and strategies.   Repetitive spaced learning was employed  today to elicit superior memory formation and behavioral change.  Class 2 severe obesity with serious comorbidity and body mass index (BMI) of 38.0 to 38.9 in adult, unspecified obesity type ([place]).  Tanise is currently in the action stage of change and her goal is to continue with weight loss efforts. I recommend Adelyn begin the structured treatment plan as follows:  She has agreed to the Category 4 Plan.  She will work on meal planning, intentional eating, and will stop all sugary drinks.  We reviewed with the patient labs from 02/07/2020 including CMP and CBC.  Exercise goals: All adults should avoid inactivity. Some physical activity is better than none, and adults who participate in any amount of physical activity gain some health benefits.   Behavioral modification strategies: increasing lean protein intake, decreasing simple carbohydrates, increasing vegetables, increasing water intake, decreasing eating out, no skipping meals, meal planning and cooking strategies, keeping healthy foods in the home and planning for success.  She was informed of the importance of frequent follow-up visits to maximize her success with intensive lifestyle modifications for her multiple health conditions. She was informed we would discuss her lab results at her next visit unless there is a critical issue that needs to be addressed sooner. Sadhana agreed to keep her next visit at the agreed upon time to discuss these results.  Objective:   Blood pressure 130/74, pulse 76, temperature 98 F (36.7 C),  height 5\' 6"  (1.676 m), weight 239 lb (108.4 kg), SpO2 98 %. Body mass index is 38.58 kg/m.  Indirect Calorimeter completed today shows a VO2 of 336 and a REE of 2338.  Her calculated basal metabolic rate is 1245 thus her basal metabolic rate is better than expected.  General: Cooperative, alert, well developed, in no acute distress. HEENT: Conjunctivae and lids unremarkable. Cardiovascular: Regular rhythm.    Lungs: Normal work of breathing. Neurologic: No focal deficits.   Lab Results  Component Value Date   CREATININE 1.14 (H) 02/11/2020   BUN 18 02/11/2020   NA 136 02/11/2020   K 4.4 02/11/2020   CL 98 02/11/2020   CO2 27 02/11/2020   Lab Results  Component Value Date   ALT 32 02/11/2020   AST 38 02/11/2020   ALKPHOS 80 02/11/2020   BILITOT 0.4 02/11/2020   Lab Results  Component Value Date   HGBA1C 7.0 (A) 02/07/2020   HGBA1C 9.2 (H) 10/18/2019   HGBA1C 9.9 (H) 07/04/2019   HGBA1C 8.2 (H) 03/12/2019   HGBA1C 8.2 (A) 08/25/2018   No results found for: INSULIN Lab Results  Component Value Date   TSH 2.665 03/09/2017   Lab Results  Component Value Date   CHOL 143 01/22/2020   HDL 31.60 (L) 01/22/2020   LDLCALC 49 03/10/2017   LDLDIRECT 68.0 01/22/2020   TRIG 293.0 (H) 01/22/2020   CHOLHDL 5 01/22/2020   Lab Results  Component Value Date   WBC 14.1 (H) 02/11/2020   HGB 10.5 (L) 02/11/2020   HCT 33.1 (L) 02/11/2020   MCV 87.8 02/11/2020   PLT 340 02/11/2020   No results found for: IRON, TIBC, FERRITIN  Attestation Statements:   Reviewed by clinician on day of visit: allergies, medications, problem list, medical history, surgical history, family history, social history, and previous encounter notes.  Migdalia Dk, am acting as Location manager for [employer], DO   I have reviewed the above documentation for accuracy and completeness, and I agree with the above. Jearld Lesch, DO

## 2020-04-01 LAB — COMPREHENSIVE METABOLIC PANEL
ALT: 23 IU/L (ref 0–32)
AST: 19 IU/L (ref 0–40)
Albumin/Globulin Ratio: 2.2 (ref 1.2–2.2)
Albumin: 4.6 g/dL (ref 3.8–4.9)
Alkaline Phosphatase: 83 IU/L (ref 48–121)
BUN/Creatinine Ratio: 16 (ref 12–28)
BUN: 18 mg/dL (ref 8–27)
Bilirubin Total: 0.3 mg/dL (ref 0.0–1.2)
CO2: 26 mmol/L (ref 20–29)
Calcium: 9.4 mg/dL (ref 8.7–10.3)
Chloride: 101 mmol/L (ref 96–106)
Creatinine, Ser: 1.1 mg/dL — ABNORMAL HIGH (ref 0.57–1.00)
GFR calc Af Amer: 63 mL/min/{1.73_m2} (ref >59)
GFR calc non Af Amer: 55 mL/min/{1.73_m2} — ABNORMAL LOW (ref >59)
Globulin, Total: 2.1 g/dL (ref 1.5–4.5)
Glucose: 188 mg/dL — ABNORMAL HIGH (ref 65–99)
Potassium: 4.6 mmol/L (ref 3.5–5.2)
Sodium: 142 mmol/L (ref 134–144)
Total Protein: 6.7 g/dL (ref 6.0–8.5)

## 2020-04-01 LAB — TSH: TSH: 1.53 u[IU]/mL (ref 0.450–4.500)

## 2020-04-01 LAB — MICROALBUMIN / CREATININE URINE RATIO
Creatinine, Urine: 51.5 mg/dL
Microalb/Creat Ratio: 7 mg/g creat (ref 0–29)
Microalbumin, Urine: 3.7 ug/mL

## 2020-04-01 LAB — C-PEPTIDE: C-Peptide: 7.4 ng/mL — ABNORMAL HIGH (ref 1.1–4.4)

## 2020-04-01 LAB — VITAMIN D 25 HYDROXY (VIT D DEFICIENCY, FRACTURES): Vit D, 25-Hydroxy: 35.2 ng/mL (ref 30.0–100.0)

## 2020-04-01 LAB — LIPID PANEL WITH LDL/HDL RATIO
Cholesterol, Total: 162 mg/dL (ref 100–199)
HDL: 40 mg/dL (ref >39)
LDL Chol Calc (NIH): 84 mg/dL (ref 0–99)
LDL/HDL Ratio: 2.1 ratio (ref 0.0–3.2)
Triglycerides: 227 mg/dL — ABNORMAL HIGH (ref 0–149)
VLDL Cholesterol Cal: 38 mg/dL (ref 5–40)

## 2020-04-01 LAB — T4, FREE: Free T4: 1.45 ng/dL (ref 0.82–1.77)

## 2020-04-01 LAB — T3: T3, Total: 85 ng/dL (ref 71–180)

## 2020-04-02 ENCOUNTER — Encounter (INDEPENDENT_AMBULATORY_CARE_PROVIDER_SITE_OTHER): Payer: Self-pay | Admitting: Bariatrics

## 2020-04-02 DIAGNOSIS — E781 Pure hyperglyceridemia: Secondary | ICD-10-CM | POA: Insufficient documentation

## 2020-04-06 IMAGING — MG DIGITAL SCREENING BILATERAL MAMMOGRAM WITH TOMO AND CAD
8 series · 8 of 24 positions shown · non-contrast
Comparison: Previous exam(s).

CLINICAL DATA: Screening.

EXAM:
DIGITAL SCREENING BILATERAL MAMMOGRAM WITH TOMO AND CAD

[L CC synth-2D]
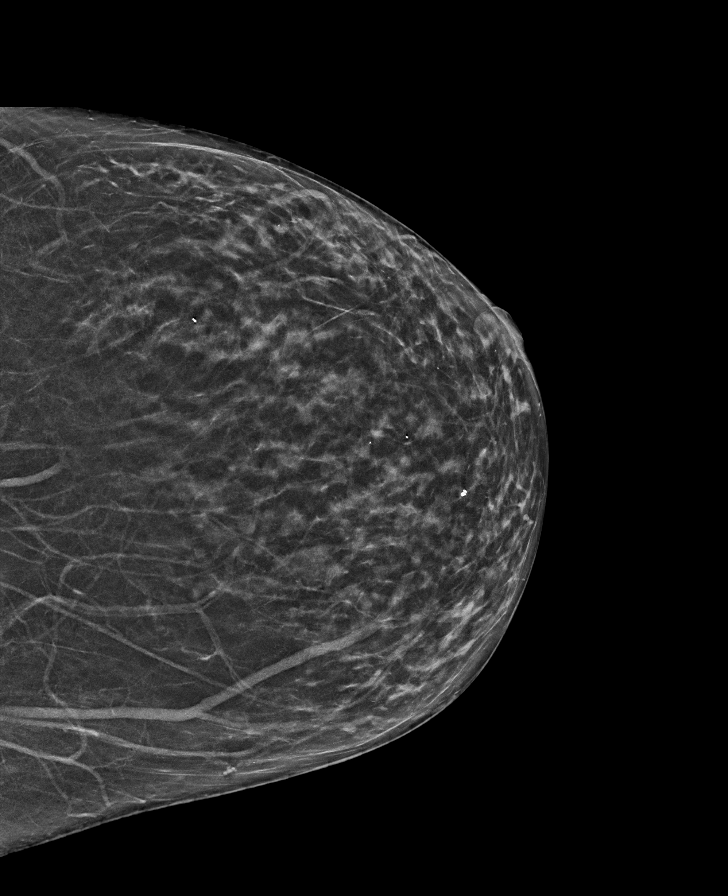

[L MLO synth-2D]
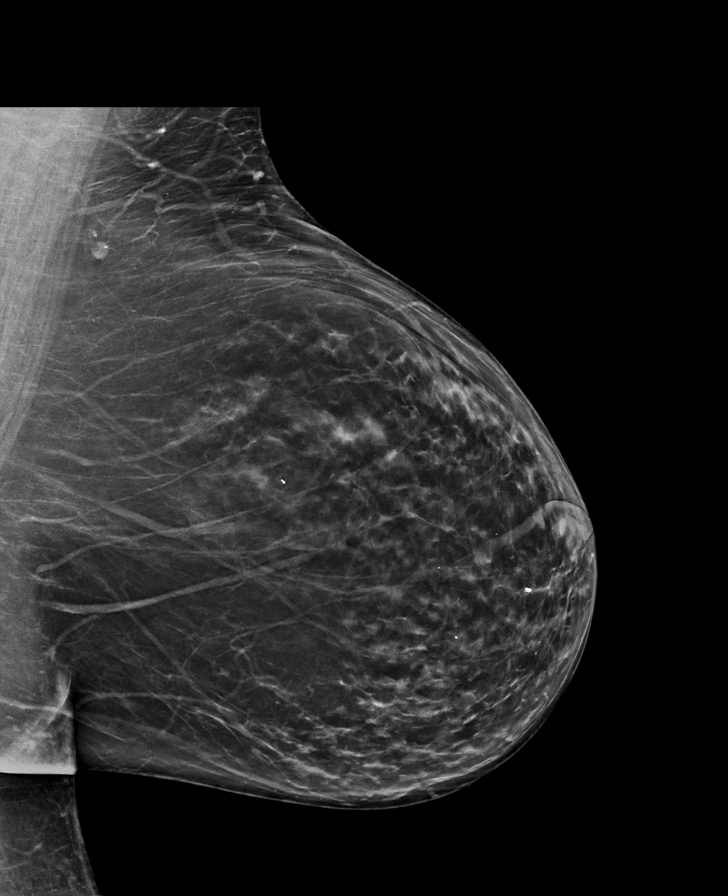

[R CC synth-2D]
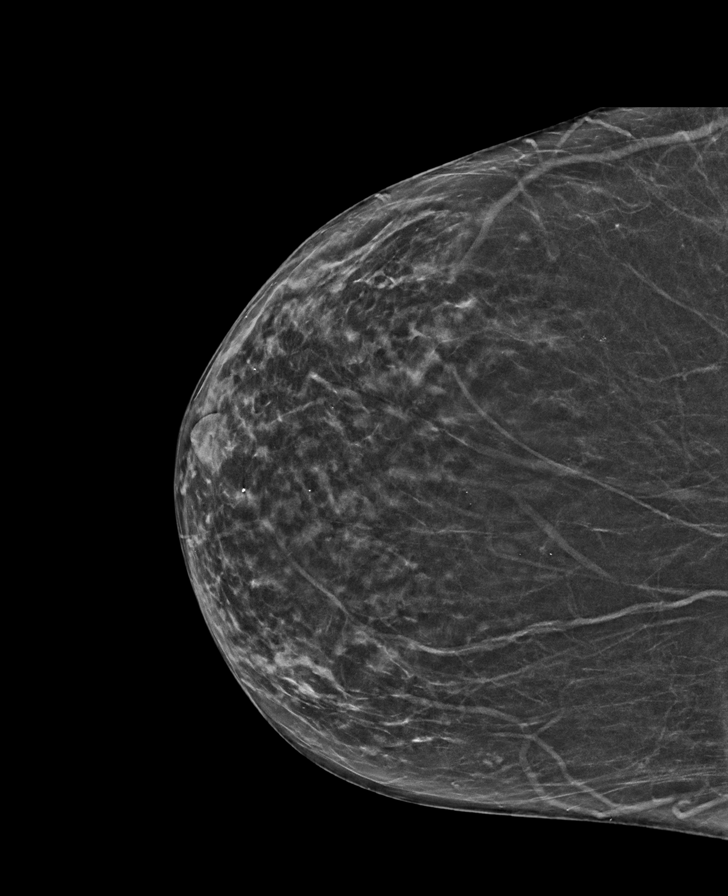

[R MLO synth-2D]
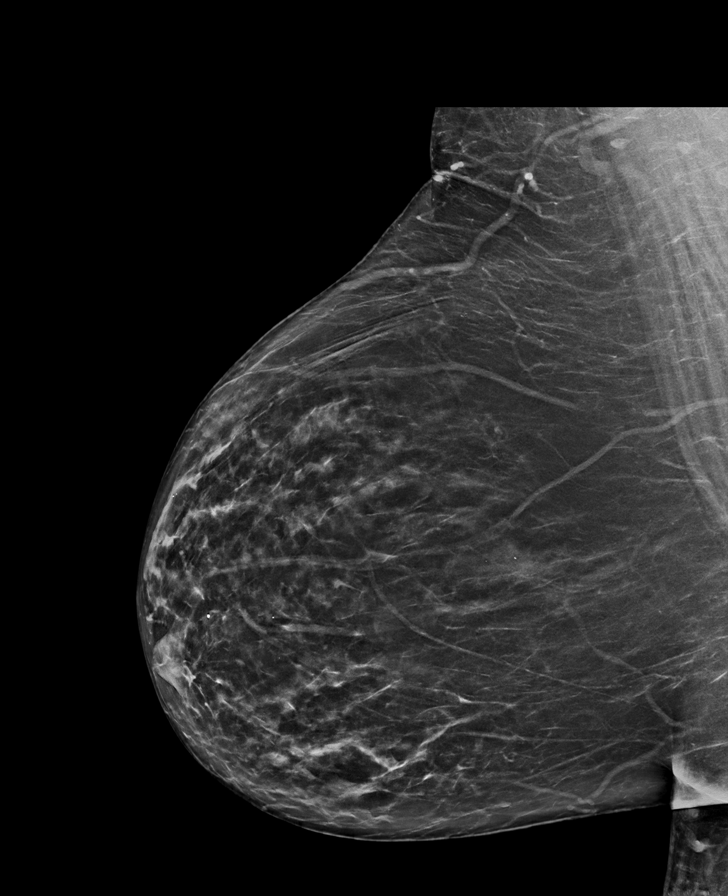

[L MLO tomo · tomo slice 39/77.0]
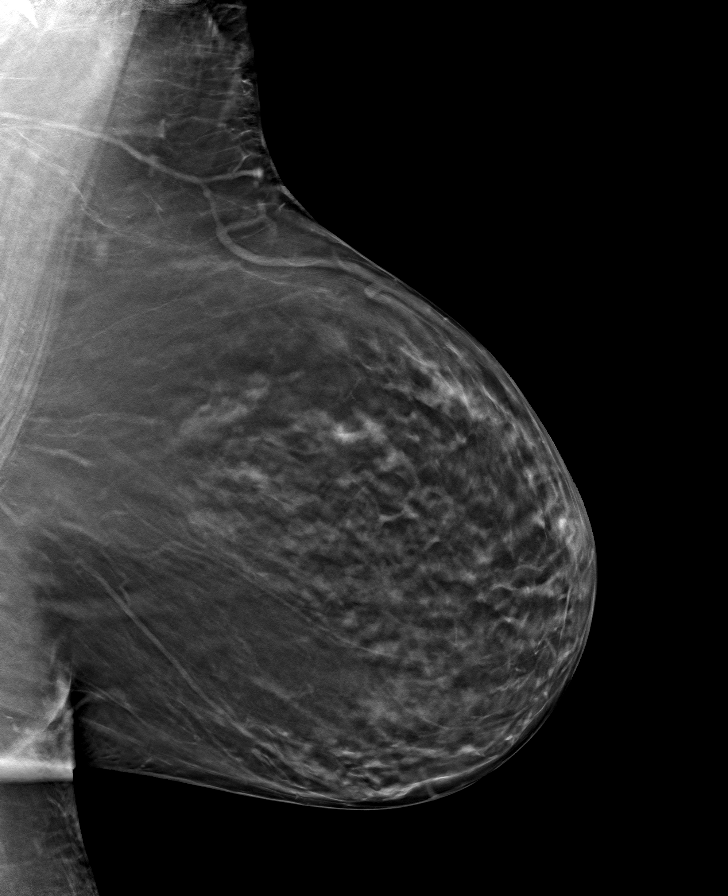

[R MLO tomo · tomo slice 38/75.0]
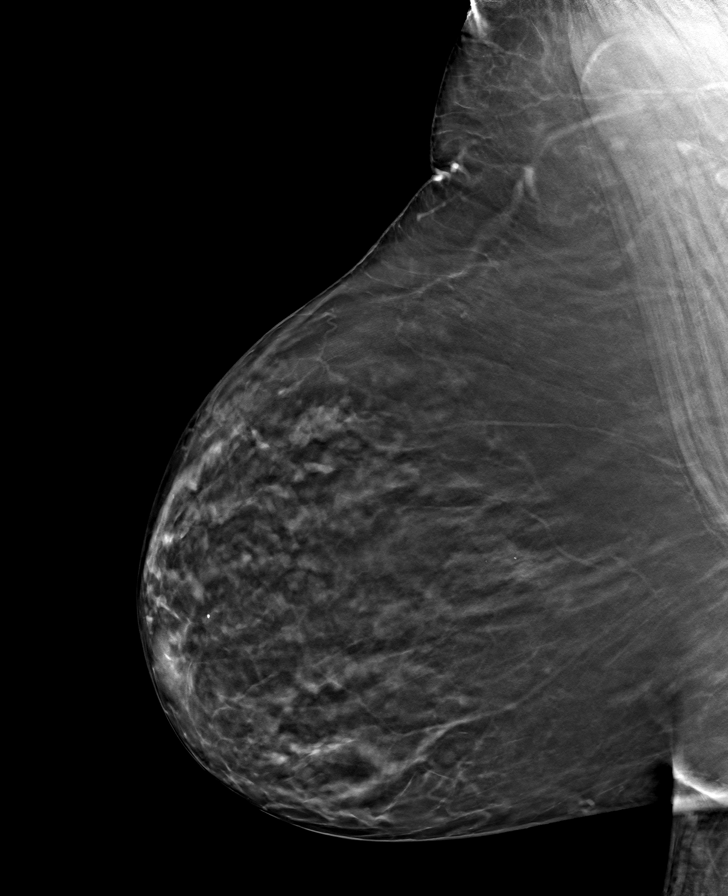

[R CC tomo · tomo slice 31/61.0]
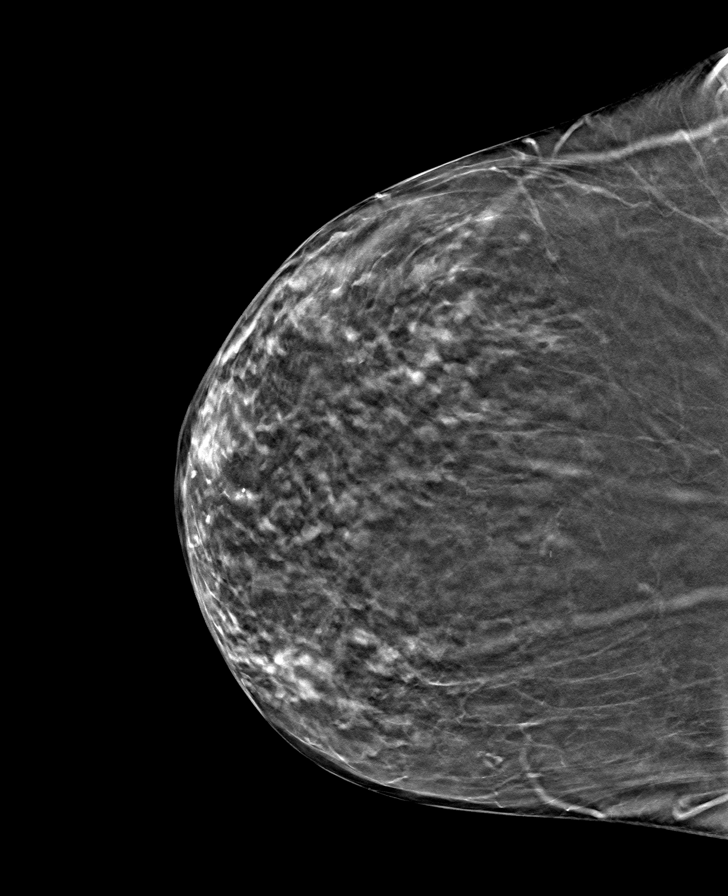

[L CC tomo · tomo slice 31/60.0]
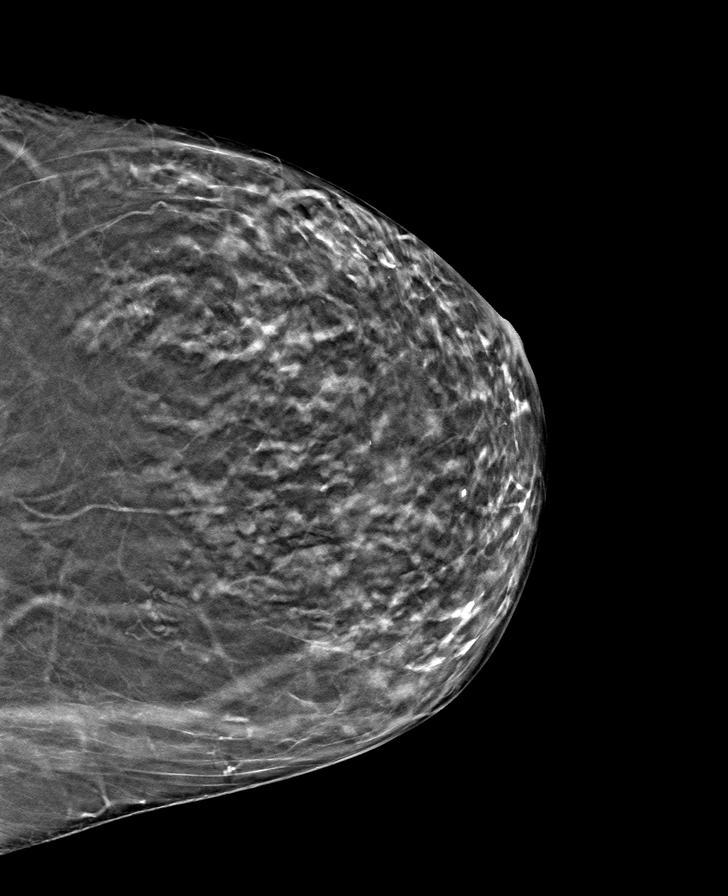

[8 of 24 positions shown; findings below may reference images not displayed]

ACR Breast Density Category c: The breast tissue is heterogeneously
dense, which may obscure small masses.
FINDINGS: There are no findings suspicious for malignancy. Images were
processed with CAD.
IMPRESSION: No mammographic evidence of malignancy. A result letter of this
screening mammogram will be mailed directly to the patient.

RECOMMENDATION:
Screening mammogram in one year. (Code:[id number])

BI-RADS CATEGORY  1: Negative.

## 2020-04-07 ENCOUNTER — Other Ambulatory Visit: Payer: Self-pay

## 2020-04-07 ENCOUNTER — Encounter: Payer: Self-pay | Admitting: Family Medicine

## 2020-04-07 ENCOUNTER — Telehealth (INDEPENDENT_AMBULATORY_CARE_PROVIDER_SITE_OTHER): Payer: 59 | Admitting: Family Medicine

## 2020-04-07 VITALS — Ht 66.0 in | Wt 231.0 lb

## 2020-04-07 DIAGNOSIS — B9689 Other specified bacterial agents as the cause of diseases classified elsewhere: Secondary | ICD-10-CM | POA: Diagnosis not present

## 2020-04-07 DIAGNOSIS — J329 Chronic sinusitis, unspecified: Secondary | ICD-10-CM

## 2020-04-07 MED ORDER — AMOXICILLIN-POT CLAVULANATE 875-125 MG PO TABS: 1.0000 | ORAL_TABLET | Freq: Two times a day (BID) | ORAL | 0 refills | Status: AC

## 2020-04-07 NOTE — Progress Notes (Signed)
Phone [phone] Virtual visit via Video note   Subjective:  Chief complaint: Chief Complaint  Patient presents with  . virtual  . Cough    This visit type was conducted due to national recommendations for restrictions regarding the COVID-19 Pandemic (e.g. social distancing).  This format is felt to be most appropriate for this patient at this time balancing risks to patient and risks to population by having him in for in person visit.  No physical exam was performed (except for noted visual exam or audio findings with Telehealth visits).    Our team/I connected with [name] at  4:20 PM EDT by a video enabled telemedicine application ([email or web address] or caregility through epic) and verified that I am speaking with the correct person using two identifiers.  Location patient: [place] Location provider: [facility], office Persons participating in the virtual visit:  patient  Our team/I discussed the limitations of evaluation and management by telemedicine and the availability of in person appointments. In light of current covid-19 pandemic, patient also understands that we are trying to protect them by minimizing in office contact if at all possible.  The patient expressed consent for telemedicine visit and agreed to proceed. Patient understands insurance will be billed.   Past Medical History-  Patient Active Problem List   Diagnosis Date Noted  . Severe persistent asthma 01/04/2019    Priority: High  . Subcortical microvascular ischemic occlusive disease 07/12/2018    Priority: High  . Obstructive chronic bronchitis without exacerbation ([place]) 09/18/2013    Priority: High  . Chest pain 04/11/2013    Priority: High  . Hypertensive heart disease with chronic diastolic congestive heart failure ([place])     Priority: High  . Sarcoidosis of lung ([place]) 04/10/2007    Priority: High  . Type II diabetes mellitus with renal manifestations ([place]) 08/21/2006    Priority: High  . Constipation  04/21/2018    Priority: Medium  . Epiglottitis     Priority: Medium  . Osteoarthritis of right knee 01/26/2017    Priority: Medium  . CKD (chronic kidney disease) stage 3, GFR 30-59 ml/min 04/27/2015    Priority: Medium  . History of osteomyelitis 03/27/2015    Priority: Medium  . Fatty liver 09/30/2014    Priority: Medium  . Major depression in full remission ([place])     Priority: Medium  . Gout 08/20/2010    Priority: Medium  . Anemia 09/18/2009    Priority: Medium  . Sleep apnea 04/21/2009    Priority: Medium  . Hyperlipidemia associated with type 2 diabetes mellitus ([place]) 08/21/2006    Priority: Medium  . Hypertension associated with diabetes ([place]) 08/21/2006    Priority: Medium  . Nontraumatic complete tear of left rotator cuff     Priority: Low  . Diabetic neuropathy ([place]) 02/20/2019    Priority: Low  . Rapid palpitations 07/12/2018    Priority: Low  . Impingement of left ankle joint 06/26/2018    Priority: Low  . Total knee replacement status, left 12/07/2017    Priority: Low  . Dysphagia 09/20/2017    Priority: Low  . Plantar fasciitis, right 07/13/2017    Priority: Low  . S/P total knee arthroplasty, right 06/29/2017    Priority: Low  . Osteoarthrosis, localized, primary, knee, right     Priority: Low  . Sprain of calcaneofibular ligament of right ankle 06/06/2017    Priority: Low  . Onychomycosis 10/27/2015    Priority: Low  . MRSA (methicillin resistant staph aureus)  culture positive 03/27/2015    Priority: Low  . Hot flashes 07/15/2014    Priority: Low  . Abnormal SPEP 04/17/2014    Priority: Low  . Fracture of left leg 04/17/2014    Priority: Low  . Cushingoid side effect of steroids ([place]) 04/17/2014    Priority: Low  . Internal hemorrhoids     Priority: Low  . Preoperative clearance 03/25/2014    Priority: Low  . Solitary pulmonary nodule, on CT 02/2013 - stable over 2 years in 2015 02/20/2013    Priority: Low  . Morbid obesity ([place]) 06/04/2009     Priority: Low  . GERD 08/21/2006    Priority: Low  . Hypertriglyceridemia 04/02/2020  . Aortic atherosclerosis ([place]) 02/14/2020    Medications- reviewed and updated Current Outpatient Medications  Medication Sig Dispense Refill  . albuterol (PROVENTIL HFA;VENTOLIN HFA) 108 (90 Base) MCG/ACT inhaler Inhale 1-2 puffs into the lungs every 6 (six) hours as needed for wheezing or shortness of breath. 8 g 5  . allopurinol (ZYLOPRIM) 100 MG tablet Take 100 mg by mouth every evening.     [name] aspirin EC 81 MG tablet Take 81 mg by mouth daily.     [name] atorvastatin (LIPITOR) 40 MG tablet TAKE 1 TABLET BY MOUTH EVERY DAY 90 tablet 3  . BREO ELLIPTA 200-25 MCG/INH AEPB TAKE 1 PUFF BY MOUTH EVERY DAY 60 each 5  . buPROPion (WELLBUTRIN XL) 300 MG 24 hr tablet TAKE 1 TABLET BY MOUTH EVERY DAY 90 tablet 3  . carvedilol (COREG) 25 MG tablet TAKE 1 TABLET (25 MG TOTAL) BY MOUTH 2 (TWO) TIMES DAILY WITH A MEAL. 180 tablet 3  . chlorpheniramine-HYDROcodone (TUSSIONEX PENNKINETIC ER) 10-8 MG/5ML SUER Take 5 mLs by mouth 2 (two) times daily. 140 mL 0  . cholecalciferol (VITAMIN D) 25 MCG (1000 UT) tablet Take 1,000 Units by mouth 2 (two) times a day.    [name] dexlansoprazole (DEXILANT) 60 MG capsule Take 1 capsule (60 mg total) by mouth daily. 30 capsule 5  . diclofenac Sodium (VOLTAREN) 1 % GEL Apply 1 application topically 4 (four) times daily as needed (pain.).    [name] diltiazem (CARDIZEM CD) 120 MG 24 hr capsule TAKE 1 CAPSULE BY MOUTH EVERY DAY 90 capsule 0  . Dulaglutide (TRULICITY) 1.5 UU/7.2ZD SOPN Inject 1.5 mg into the skin once a week. 4 pen 11  . famotidine (PEPCID) 20 MG tablet Take 1 tablet (20 mg total) by mouth at bedtime. 30 tablet 3  . fenofibrate 160 MG tablet TAKE 1 TABLET BY MOUTH EVERY DAY 90 tablet 1  . fluticasone (FLONASE) 50 MCG/ACT nasal spray Place 2 sprays into both nostrils daily as needed for allergies. 48 mL 3  . furosemide (LASIX) 40 MG tablet TAKE 1 TABLET BY MOUTH TWICE A DAY 180  tablet 3  . gabapentin (NEURONTIN) 100 MG capsule TAKE 1 CAPSULE BY MOUTH THREE TIMES A DAY 270 capsule 3  . glucose blood (CONTOUR NEXT TEST) test strip 1 each by Other route 4 (four) times daily. And lancets 4/day 400 each 3  . HUMALOG KWIKPEN 100 UNIT/ML KwikPen TAKE AS NEEDED, FOR A TOTAL OF 25 UNITS PER DAY 15 mL 29  . icosapent Ethyl (VASCEPA) 1 g capsule Take 2 capsules (2 g total) by mouth 2 (two) times daily. 120 capsule 11  . insulin glargine (LANTUS SOLOSTAR) 100 UNIT/ML Solostar Pen Inject 40 Units into the skin every morning. 18 mL 2  . Insulin Pen Needle (PEN NEEDLES) 32G  X 4 MM MISC 1 each by Does not apply route 4 (four) times daily. E11.9 400 each 0  . isosorbide mononitrate (IMDUR) 30 MG 24 hr tablet TAKE 1 TABLET (30 MG TOTAL) BY MOUTH AT BEDTIME. 90 tablet 1  . KLOR-CON M20 20 MEQ tablet TAKE 1 & 1/2 TABLETS BY MOUTH TWICE A DAY 270 tablet 2  . lactulose (CHRONULAC) 10 GM/15ML solution Take 10 g by mouth daily as needed (constipation).     [name] linaclotide (LINZESS) 290 MCG CAPS capsule Take 1 capsule (290 mcg total) by mouth daily before breakfast. 30 capsule 11  . LORazepam (ATIVAN) 0.5 MG tablet Take 0.5 mg by mouth every 12 (twelve) hours as needed for anxiety.    . metFORMIN (GLUCOPHAGE) 1000 MG tablet TAKE 1 TABLET BY MOUTH 2 TIMES DAILY WITH A MEAL. MUST CALL TO SCHEDULE AN APPT. 180 tablet 1  . Microlet Lancets MISC 1 each by Does not apply route 2 (two) times daily. E11.9 100 each 2  . nitroGLYCERIN (NITROSTAT) 0.4 MG SL tablet Place 1 tablet (0.4 mg total) under the tongue every 5 (five) minutes as needed for chest pain. Reported on 01/05/2016 25 tablet 6  . ondansetron (ZOFRAN-ODT) 4 MG disintegrating tablet Take 1 tablet (4 mg total) by mouth every 8 (eight) hours as needed for nausea or vomiting. 50 tablet 2  . oxyCODONE-acetaminophen (PERCOCET) 5-325 MG tablet Take 1 tablet by mouth every 6 (six) hours as needed for severe pain. 30 tablet 0  . predniSONE (DELTASONE) 5  MG tablet Take 5 mg by mouth daily with breakfast.    . telmisartan (MICARDIS) 20 MG tablet TAKE 1 TABLET BY MOUTH EVERY DAY 90 tablet 1  . triamcinolone cream (KENALOG) 0.1 % Apply 1 application topically 2 (two) times daily as needed (rash/irritation.).    [name] venlafaxine XR (EFFEXOR-XR) 75 MG 24 hr capsule TAKE 1 CAPSULE BY MOUTH TWICE A DAY 180 capsule 3  . amoxicillin-clavulanate (AUGMENTIN) 875-125 MG tablet Take 1 tablet by mouth 2 (two) times daily for 7 days. 14 tablet 0   No current facility-administered medications for this visit.     Objective:  Ht 5\' 6"  (1.676 m)   Wt 231 lb (104.8 kg)   BMI 37.28 kg/m  self reported vitals Gen: NAD, resting comfortably, intermittent cough during visit Lungs: nonlabored, normal respiratory rate  Skin: appears dry, no obvious rash    Assessment and Plan   Cough/sinus pressure S: pt c/o dry cough that has been going on for about a week (7-8 days ago) and c/o headaches and sinus pressure. Very mild shortness of breath with activity. Not able to blow much out. Symptoms worsening instead of improving  Also has some burning with peeing starting Saturday. No fever/chills. No anosmia or loss of smell.    She has not been covid but she is fully vaccinated plus she has had covid so probability of covid is very low. No known covid contacts.   She has used the tussonex and Delsym with little relief. Tylenol for headache was helpful.  A/P: 59 year old female with 70 days of cough and sinus pressure with sinus pressure and congestion worsening in the last few days instead of improving concerning for double sickening and bacterial sinusitis-we will treat with Augmentin.  This also may provide coverage for potential UTI-we will get her COVID-19 tested and if urinary symptoms not improving she can come by for a urine culture.  She does have mild shortness of breath but I  doubt this is a substantial worsening of underlying sarcoidosis of the lungs or  obstructive chronic bronchitis-she will monitor for any worsening symptoms  Patient with symptoms concerning for potential covid 19 but she is fully vaccinated plus had covid 49 previously -Therefore overall low risk.  -information provided on testing scheduling "please text "COVID" to [phone], OR you can log on to [email or web address] to easily make an on-line appointment. "   - recommended patient watch closely for shortness of breath or confusion or worsening symptoms and if those occur he should contact us immediately or seek care.   Recommended follow up: As needed if symptoms fail to improve Future Appointments  Date Time Provider [place]  04/07/2020  4:20 PM [name], MD [facility]  04/15/2020  8:40 AM Georgia Lopes, DO [facility] None  04/30/2020  9:15 AM Renato Shin, MD [facility] None  05/27/2020  2:40 PM [name], MD [facility]    Lab/Order associations:   ICD-10-CM   1. Bacterial sinusitis  J32.9    B96.89    Meds ordered this encounter  Medications  . amoxicillin-clavulanate (AUGMENTIN) 875-125 MG tablet    Sig: Take 1 tablet by mouth 2 (two) times daily for 7 days.    Dispense:  14 tablet    Refill:  0   Return precautions advised.  Garret Reddish, MD

## 2020-04-10 ENCOUNTER — Encounter (INDEPENDENT_AMBULATORY_CARE_PROVIDER_SITE_OTHER): Payer: Self-pay

## 2020-04-15 ENCOUNTER — Encounter (INDEPENDENT_AMBULATORY_CARE_PROVIDER_SITE_OTHER): Payer: Self-pay | Admitting: Bariatrics

## 2020-04-15 ENCOUNTER — Other Ambulatory Visit: Payer: Self-pay

## 2020-04-15 ENCOUNTER — Ambulatory Visit (INDEPENDENT_AMBULATORY_CARE_PROVIDER_SITE_OTHER): Payer: 59 | Admitting: Bariatrics

## 2020-04-15 VITALS — BP 144/79 | HR 72 | Temp 97.5°F | Ht 66.0 in | Wt 235.0 lb

## 2020-04-15 DIAGNOSIS — E1159 Type 2 diabetes mellitus with other circulatory complications: Secondary | ICD-10-CM | POA: Diagnosis not present

## 2020-04-15 DIAGNOSIS — I10 Essential (primary) hypertension: Secondary | ICD-10-CM | POA: Diagnosis not present

## 2020-04-15 DIAGNOSIS — E559 Vitamin D deficiency, unspecified: Secondary | ICD-10-CM | POA: Diagnosis not present

## 2020-04-15 DIAGNOSIS — Z6838 Body mass index (BMI) 38.0-38.9, adult: Secondary | ICD-10-CM | POA: Diagnosis not present

## 2020-04-15 DIAGNOSIS — Z794 Long term (current) use of insulin: Secondary | ICD-10-CM

## 2020-04-15 DIAGNOSIS — Z9189 Other specified personal risk factors, not elsewhere classified: Secondary | ICD-10-CM

## 2020-04-15 DIAGNOSIS — I152 Hypertension secondary to endocrine disorders: Secondary | ICD-10-CM

## 2020-04-15 DIAGNOSIS — E1129 Type 2 diabetes mellitus with other diabetic kidney complication: Secondary | ICD-10-CM

## 2020-04-15 MED ORDER — VITAMIN D (ERGOCALCIFEROL) 1.25 MG (50000 UNIT) PO CAPS: 50000.0000 [IU] | ORAL_CAPSULE | ORAL | 0 refills | Status: DC

## 2020-04-15 NOTE — Progress Notes (Signed)
Chief Complaint:   [name] is here to discuss her progress with her [name] treatment plan along with follow-up of her [name] related diagnoses. [name] is on the Category 4 Plan and states she is following her eating plan approximately 98% of the time. [name] states she is exercising 0 minutes 0 times per week.  Today's visit was #: 2 Starting weight: 239 lbs Starting date: 03/31/2020 Today's weight: 235 lbs Today's date: 04/15/2020 Total lbs lost to date: 4 Total lbs lost since last in-office visit: 4  Interim History: [name] is down 4 lbs. She states that the plan was slightly hard, but states that she got used to it. She reports doing well with her water and protein intake.  Subjective:   Hypertension associated with diabetes ([name]). Blood pressure is reasonably controlled.  BP Readings from Last 3 Encounters:  04/15/20 (!) 144/79  03/31/20 130/74  02/14/20 (!) 122/58   Lab Results  Component Value Date   CREATININE 1.10 (H) 03/31/2020   CREATININE 1.14 (H) 02/11/2020   CREATININE 0.96 01/22/2020   Type 2 diabetes mellitus with other diabetic kidney complication, with long-term current use of insulin ([name]). [name] is taking Trulicity, Lantus, Humalog, and metformin. Fasting blood sugars are in the 130's with no lows and 2-hour postprandials in the 190's at the highest.  Lab Results  Component Value Date   HGBA1C 7.0 (A) 02/07/2020   HGBA1C 9.2 (H) 10/18/2019   HGBA1C 9.9 (H) 07/04/2019   Lab Results  Component Value Date   MICROALBUR 0.9 10/03/2017   LDLCALC 84 03/31/2020   CREATININE 1.10 (H) 03/31/2020   No results found for: INSULIN  Vitamin D deficiency. [name] is taking OTC Vitamin D supplementation.    Ref. Range 03/31/2020 12:25  Vitamin D, 25-Hydroxy Latest Ref Range: 30.0 - 100.0 ng/mL 35.2   At risk for complication associated with hypotension. The patient is at a higher than average risk of hypotension secondary to diabetes mellitus  type II and dietary changes.  Assessment/Plan:   Hypertension associated with diabetes ([name]). [name] is working on healthy weight loss and exercise to improve blood pressure control. We will watch for signs of hypotension as she continues her lifestyle modifications. She will continue her current medications with no adjustments at this time.   Type 2 diabetes mellitus with other diabetic kidney complication, with long-term current use of insulin ([name]). Good blood sugar control is important to decrease the likelihood of diabetic complications such as nephropathy, neuropathy, limb loss, blindness, coronary artery disease, and death. Intensive lifestyle modification including diet, exercise and weight loss are the first line of treatment for diabetes. [name] will continue her current medications as directed.  Vitamin D deficiency. Low Vitamin D level contributes to fatigue and are associated with [name], breast, and colon cancer. She was given a prescription for Vitamin D, [name], ([name]) 1.25 MG (50000 UNIT) CAPS capsule every week #4 with 0 refills and will follow-up for routine testing of Vitamin D, at least 2-3 times per year to avoid over-replacement.   At risk for complication associated with hypotension. [name] was given approximately 15 minutes of education and counseling today to help avoid hypotension. We discussed risks of hypotension with weight loss and signs of hypotension such as feeling lightheaded or unsteady.  Repetitive spaced learning was employed today to elicit superior memory formation and behavioral change.  Class 2 severe [name] with serious comorbidity and body mass index (BMI) of 38.0 to 38.9 in adult, unspecified  [name] type ([name]).  [name] is currently in the action stage of change. As such, her goal is to continue with weight loss efforts. She has agreed to the Category 4 Plan.   She will work on meal planning.  We independently reviewed with the patient labs  from 03/31/2020 including CMP, lipids, Vitamin D, C-peptide, and microalbumin/urine.  Exercise goals: [name] has joined [name].  Behavioral modification strategies: increasing lean protein intake, decreasing simple carbohydrates, increasing vegetables, increasing water intake, decreasing eating out, no skipping meals, meal planning and cooking strategies, keeping healthy foods in the home, celebration eating strategies and planning for success.  [name] has agreed to follow-up with [name] in 2 weeks. She was informed of the importance of frequent follow-up visits to maximize her success with intensive lifestyle modifications for her multiple health conditions.   Objective:   Blood pressure (!) 144/79, pulse 72, temperature (!) 97.5 F (36.4 C), height 5\' 6"  (1.676 m), weight 235 lb (106.6 kg), SpO2 100 %. Body mass index is 37.93 kg/m.  General: Cooperative, alert, well developed, in no acute distress. HEENT: Conjunctivae and lids unremarkable. Cardiovascular: Regular rhythm.  Lungs: Normal work of breathing. Neurologic: No focal deficits.   Lab Results  Component Value Date   CREATININE 1.10 (H) 03/31/2020   BUN 18 03/31/2020   NA 142 03/31/2020   K 4.6 03/31/2020   CL 101 03/31/2020   CO2 26 03/31/2020   Lab Results  Component Value Date   ALT 23 03/31/2020   AST 19 03/31/2020   ALKPHOS 83 03/31/2020   BILITOT 0.3 03/31/2020   Lab Results  Component Value Date   HGBA1C 7.0 (A) 02/07/2020   HGBA1C 9.2 (H) 10/18/2019   HGBA1C 9.9 (H) 07/04/2019   HGBA1C 8.2 (H) 03/12/2019   HGBA1C 8.2 (A) 08/25/2018   No results found for: INSULIN Lab Results  Component Value Date   TSH 1.530 03/31/2020   Lab Results  Component Value Date   CHOL 162 03/31/2020   HDL 40 03/31/2020   LDLCALC 84 03/31/2020   LDLDIRECT 68.0 01/22/2020   TRIG 227 (H) 03/31/2020   CHOLHDL 5 01/22/2020   Lab Results  Component Value Date   WBC 14.1 (H) 02/11/2020   HGB 10.5 (L)  02/11/2020   HCT 33.1 (L) 02/11/2020   MCV 87.8 02/11/2020   PLT 340 02/11/2020   No results found for: IRON, TIBC, FERRITIN  Attestation Statements:   Reviewed by clinician on day of visit: allergies, medications, problem list, medical history, surgical history, family history, social history, and previous encounter notes.  Migdalia Dk, am acting as Location manager for [employer], DO   I have reviewed the above documentation for accuracy and completeness, and I agree with the above. Jearld Lesch, DO

## 2020-04-29 ENCOUNTER — Ambulatory Visit (INDEPENDENT_AMBULATORY_CARE_PROVIDER_SITE_OTHER): Payer: 59 | Admitting: Bariatrics

## 2020-04-29 ENCOUNTER — Encounter (INDEPENDENT_AMBULATORY_CARE_PROVIDER_SITE_OTHER): Payer: Self-pay | Admitting: Bariatrics

## 2020-04-29 ENCOUNTER — Other Ambulatory Visit: Payer: Self-pay

## 2020-04-29 VITALS — BP 133/72 | HR 69 | Temp 97.9°F | Ht 66.0 in | Wt 231.0 lb

## 2020-04-29 DIAGNOSIS — E1129 Type 2 diabetes mellitus with other diabetic kidney complication: Secondary | ICD-10-CM

## 2020-04-29 DIAGNOSIS — Z794 Long term (current) use of insulin: Secondary | ICD-10-CM | POA: Diagnosis not present

## 2020-04-29 DIAGNOSIS — E559 Vitamin D deficiency, unspecified: Secondary | ICD-10-CM | POA: Diagnosis not present

## 2020-04-29 DIAGNOSIS — Z6837 Body mass index (BMI) 37.0-37.9, adult: Secondary | ICD-10-CM

## 2020-04-29 NOTE — Progress Notes (Signed)
Chief Complaint:   OBESITY [name] is here to discuss her progress with her obesity treatment plan along with follow-up of her obesity related diagnoses. [name] is on the Category 4 Plan and states she is following her eating plan approximately 100% of the time. [name] states she is doing cardio 60 minutes 5 times per week.  Today's visit was #: 3 Starting weight: 239 lbs Starting date: 03/31/2020 Today's weight: 231 lbs Today's date: 04/29/2020 Total lbs lost to date: 8 Total lbs lost since last in-office visit: 4  Interim History: [name] is down an additional 4 lbs. She denies struggles and is doing well with her water intake.  Subjective:   Type 2 diabetes mellitus with other diabetic kidney complication, with long-term current use of insulin ([place]). [name] is taking metformin, insulin, Lispro, lantus, and Trulicity. Fasting blood sugars are in the range of 110's to 120's with 2-hour postprandials in the 130's.  Lab Results  Component Value Date   HGBA1C 7.0 (A) 02/07/2020   HGBA1C 9.2 (H) 10/18/2019   HGBA1C 9.9 (H) 07/04/2019   Lab Results  Component Value Date   MICROALBUR 0.9 10/03/2017   LDLCALC 84 03/31/2020   CREATININE 1.10 (H) 03/31/2020   No results found for: INSULIN  Vitamin D deficiency. [name] is taking OTC Vitamin D supplementation. No nausea, vomiting, or muscle weakness.    Ref. Range 03/31/2020 12:25  Vitamin D, 25-Hydroxy Latest Ref Range: 30.0 - 100.0 ng/mL 35.2   Assessment/Plan:   Type 2 diabetes mellitus with other diabetic kidney complication, with long-term current use of insulin ([place]). Good blood sugar control is important to decrease the likelihood of diabetic complications such as nephropathy, neuropathy, limb loss, blindness, coronary artery disease, and death. Intensive lifestyle modification including diet, exercise and weight loss are the first line of treatment for diabetes. [name] will continue her medications as  directed.  Vitamin D deficiency. Low Vitamin D level contributes to fatigue and are associated with obesity, breast, and colon cancer. She agrees to continue to take OTC Vitamin D as directed and will follow-up for routine testing of Vitamin D, at least 2-3 times per year to avoid over-replacement.  Class 2 severe obesity with serious comorbidity and body mass index (BMI) of 37.0 to 37.9 in adult, unspecified obesity type ([name]).  [name] is currently in the action stage of change. As such, her goal is to continue with weight loss efforts. She has agreed to the Category 4 Plan.   She will work on meal planning and intentional eating.   Handout was provided on "[place]."  We discussed journaling.  Exercise goals: [name] will continue doing cardio 60 minutes 5 times per week.  Behavioral modification strategies: increasing lean protein intake, decreasing simple carbohydrates, increasing vegetables, increasing water intake, decreasing eating out, no skipping meals, meal planning and cooking strategies, keeping healthy foods in the home and planning for success.  [name] has agreed to follow-up with our clinic in 2-3 weeks. She was informed of the importance of frequent follow-up visits to maximize her success with intensive lifestyle modifications for her multiple health conditions.   Objective:   Blood pressure 133/72, pulse 69, temperature 97.9 F (36.6 C), height 5\' 6"  (1.676 m), weight 231 lb (104.8 kg), SpO2 99 %. Body mass index is 37.28 kg/m.  General: Cooperative, alert, well developed, in no acute distress. HEENT: Conjunctivae and lids unremarkable. Cardiovascular: Regular rhythm.  Lungs: Normal work of breathing. Neurologic: No focal deficits.  Lab Results  Component Value Date   CREATININE 1.10 (H) 03/31/2020   BUN 18 03/31/2020   NA 142 03/31/2020   K 4.6 03/31/2020   CL 101 03/31/2020   CO2 26 03/31/2020   Lab Results  Component Value Date   ALT 23 03/31/2020    AST 19 03/31/2020   ALKPHOS 83 03/31/2020   BILITOT 0.3 03/31/2020   Lab Results  Component Value Date   HGBA1C 7.0 (A) 02/07/2020   HGBA1C 9.2 (H) 10/18/2019   HGBA1C 9.9 (H) 07/04/2019   HGBA1C 8.2 (H) 03/12/2019   HGBA1C 8.2 (A) 08/25/2018   No results found for: INSULIN Lab Results  Component Value Date   TSH 1.530 03/31/2020   Lab Results  Component Value Date   CHOL 162 03/31/2020   HDL 40 03/31/2020   LDLCALC 84 03/31/2020   LDLDIRECT 68.0 01/22/2020   TRIG 227 (H) 03/31/2020   CHOLHDL 5 01/22/2020   Lab Results  Component Value Date   WBC 14.1 (H) 02/11/2020   HGB 10.5 (L) 02/11/2020   HCT 33.1 (L) 02/11/2020   MCV 87.8 02/11/2020   PLT 340 02/11/2020   No results found for: IRON, TIBC, FERRITIN  Attestation Statements:   Reviewed by clinician on day of visit: allergies, medications, problem list, medical history, surgical history, family history, social history, and previous encounter notes.  Time spent on visit including pre-visit chart review and post-visit charting and care was 20 minutes.   Migdalia Dk, am acting as Location manager for [employer], DO   I have reviewed the above documentation for accuracy and completeness, and I agree with the above. Jearld Lesch, DO

## 2020-04-30 ENCOUNTER — Ambulatory Visit (INDEPENDENT_AMBULATORY_CARE_PROVIDER_SITE_OTHER): Payer: 59 | Admitting: Endocrinology

## 2020-04-30 ENCOUNTER — Encounter: Payer: Self-pay | Admitting: Endocrinology

## 2020-04-30 ENCOUNTER — Encounter (INDEPENDENT_AMBULATORY_CARE_PROVIDER_SITE_OTHER): Payer: Self-pay | Admitting: Bariatrics

## 2020-04-30 VITALS — BP 138/60 | HR 74 | Ht 66.0 in | Wt 234.6 lb

## 2020-04-30 DIAGNOSIS — Z794 Long term (current) use of insulin: Secondary | ICD-10-CM | POA: Diagnosis not present

## 2020-04-30 DIAGNOSIS — E1129 Type 2 diabetes mellitus with other diabetic kidney complication: Secondary | ICD-10-CM

## 2020-04-30 LAB — POCT GLYCOSYLATED HEMOGLOBIN (HGB A1C): Hemoglobin A1C: 6.7 % — AB (ref 4.0–5.6)

## 2020-04-30 MED ORDER — LANTUS SOLOSTAR 100 UNIT/ML ~~LOC~~ SOPN: 30.0000 [IU] | PEN_INJECTOR | SUBCUTANEOUS | 2 refills | Status: DC

## 2020-04-30 MED ORDER — TRULICITY 3 MG/0.5ML ~~LOC~~ SOAJ: 3.0000 mg | SUBCUTANEOUS | 3 refills | Status: DC

## 2020-04-30 MED ORDER — INSULIN LISPRO (1 UNIT DIAL) 100 UNIT/ML (KWIKPEN): 15.0000 [IU] | PEN_INJECTOR | Freq: Every day | SUBCUTANEOUS | 29 refills | Status: DC

## 2020-04-30 NOTE — Progress Notes (Signed)
Subjective:    Patient ID: [name], female    DOB: [birth year], 59 y.o.   MRN: [id number]  HPI Pt returns for f/u of diabetes mellitus: DM type: Insulin-requiring type 2 Dx'ed: 4235 Complications: PN and mild CAD.  Therapy: insulin since 2006, and 2 oral meds.  GDM: never DKA: never Severe hypoglycemia: never Pancreatitis: never Pancreatic imaging: CT (2015) showed fatty atrophy.  Other: she takes 2 QD insulins, after poor results with multiple daily injections; she intermittently takes prednisone for sarcoidosis or AB.  Interval history: prednisone is 5 mg qd.  She takes meds as rx'ed.  cbg varies from 90-150.  pt states she feels well in general.  Past Medical History:  Diagnosis Date  . Abnormal SPEP 04/17/2014  . Acute left ankle pain 01/26/2017  . ANEMIA-UNSPECIFIED 09/18/2009  . Anxiety   . Arthritis   . CHF (congestive heart failure) ([place])   . Chronic diastolic heart failure, NYHA class 2 (HCC)    Normal LVEDP by May 2018  . COPD (chronic obstructive pulmonary disease) ([place])   . Depression   . DIABETES MELLITUS, TYPE II 08/21/2006  . Diabetic osteomyelitis ([place]) 05/29/2015  . Fatty liver   . Fracture of 5th metatarsal    non union  . GERD 36/14/4315   Had fundoplication  . GOUT 08/20/2010  . Hammer toe of right foot    2-5th toes  . Hx of umbilical hernia repair   . HYPERLIPIDEMIA 08/21/2006  . HYPERTENSION 08/21/2006  . Infection of wound due to methicillin resistant Staphylococcus aureus (MRSA)   . Internal hemorrhoids   . Kidney problem   . Multiple allergies 10/14/2016  . OBESITY 06/04/2009  . Onychomycosis 10/27/2015  . Osteomyelitis of left foot ([place]) 05/29/2015  . Pulmonary sarcoidosis ([place])    Followed locally by pulmonology, but also by Dr. Casper Harrison at [facility]  . Right knee pain 01/26/2017  . SOB (shortness of breath)   . Vocal cord dysfunction   . Wears partial dentures     Past Surgical History:  Procedure Laterality Date  .  ABDOMINAL HYSTERECTOMY    . APPENDECTOMY    . BLADDER SUSPENSION  11/11/2011   Procedure: TRANSVAGINAL TAPE (TVT) PROCEDURE;  Surgeon: Olga Millers, MD;  Location: [facility];  Service: Gynecology;  Laterality: N/A;  . CAROTIDS  02/18/11   CAROTID DUPLEX; VERTEBRALS ARE PATENT WITH ANTEGRADE FLOW. ICA/CCA RATIO 1.61 ON RIGHT AND 0.75 ON LEFT  . CHOLECYSTECTOMY  1984  . CYSTOSCOPY  11/11/2011   Procedure: CYSTOSCOPY;  Surgeon: Olga Millers, MD;  Location: [facility];  Service: Gynecology;  Laterality: N/A;  . EXTUBATION (ENDOTRACHEAL) IN OR N/A 09/23/2017   Procedure: EXTUBATION (ENDOTRACHEAL) IN OR;  Surgeon: Helayne Seminole, MD;  Location: [name];  Service: ENT;  Laterality: N/A;  . FIBEROPTIC LARYNGOSCOPY AND TRACHEOSCOPY N/A 09/23/2017   Procedure: FLEXIBLE FIBEROPTIC LARYNGOSCOPY;  Surgeon: Helayne Seminole, MD;  Location: [facility];  Service: ENT;  Laterality: N/A;  . FRACTURE SURGERY     foot  . HALLUX FUSION Left 10/02/2018   Procedure: HALLUX ARTHRODESIS;  Surgeon: Edrick Kins, DPM;  Location: [facility];  Service: Podiatry;  Laterality: Left;  . HAMMER TOE SURGERY Left 10/02/2018   Procedure: HAMMER TOE CORRECTION 2ND 3RD 4RD FIFTH TOE;  Surgeon: Edrick Kins, DPM;  Location: [facility];  Service: Podiatry;  Laterality: Left;  . HAMMER TOE SURGERY Right 04/12/2019   Procedure: HAMMER TOE CORRECTION, 2ND, 3RD, 4TH AND  5TH TOES OF RIGHT FOOT;  Surgeon: Edrick Kins, DPM;  Location: [facility];  Service: Podiatry;  Laterality: Right;  . HAMMER TOE SURGERY Left 10/25/2019   Procedure: HAMMER TOE REPAIR SECOND, THIRD, FOUTH;  Surgeon: Edrick Kins, DPM;  Location: [facility];  Service: Podiatry;  Laterality: Left;  . HERNIA REPAIR    . I & D EXTREMITY Left 06/27/2015   Procedure: Partial Excision Left Calcaneus, Place Antibiotic Beads, and Wound VAC;  Surgeon: Newt Minion, MD;  Location: [place];  Service: Orthopedics;  Laterality: Left;  . JOINT REPLACEMENT    . KNEE ARTHROSCOPY     right  . LEFT  AND RIGHT HEART CATHETERIZATION WITH CORONARY ANGIOGRAM N/A 04/23/2013   Procedure: LEFT AND RIGHT HEART CATHETERIZATION WITH CORONARY ANGIOGRAM;  Surgeon: Leonie Man, MD;  Location: [facility];  Service: Cardiovascular;  Laterality: N/A;  . LEFT HEART CATH AND CORONARY ANGIOGRAPHY N/A 03/11/2017   Procedure: Left Heart Cath and Coronary Angiography;  Surgeon: Sherren Mocha, MD;  Location: [facility]; angiographically minimal CAD in the LAD otherwise normal.;  Normal LVEDP.  FALSE POSITIVE MYOVIEW  . LEFT HEART CATH AND CORONARY ANGIOGRAPHY  07/20/2010   LVEF 50-55% WITH VERY MILD GLOBAL HYPOKINESIA; ESSENTIALLY NORMAL CORONARY ARTERIES; NORMAL LV FUNCTION  . METATARSAL OSTEOTOMY WITH OPEN REDUCTION INTERNAL FIXATION (ORIF) METATARSAL WITH FUSION Left 04/09/2014   Procedure: LEFT FOOT FRACTURE OPEN TREATMENT METATARSAL INCLUDES INTERNAL FIXATION EACH;  Surgeon: Lorn Junes, MD;  Location: [facility];  Service: Orthopedics;  Laterality: Left;  . NISSEN FUNDOPLICATION  1610  . NM MYOVIEW LTD  03/09/2013   Lexiscan --> EF 50%; NORMAL MYOCARDIAL PERFUSION STUDY - breast attenuation  . NM MYOVIEW LTD  03/10/2017   : Moderate size "stress-induced "perfusion defect at the apex as well as "ill-defined stress-induced perfusion defect in the lateral wall.  EF 55%.  INTERMEDIATE risk. -->  FALSE POSITIVE  . Right and left CARDIAC CATHETERIZATION  04/23/2013   Angiographic normal coronaries; LVEDP 20 mmHg, PCWP 12-14 mmHg, RAP 12 mmHg.; Fick CO/CI 4.9/2.2  . SHOULDER ARTHROSCOPY Left 03/14/2019   Procedure: LEFT SHOULDER ARTHROSCOPY, DEBRIDEMENT, AND DECOMPRESSION;  Surgeon: Newt Minion, MD;  Location: [facility];  Service: Orthopedics;  Laterality: Left;  . TOTAL KNEE ARTHROPLASTY Right 06/29/2017   Procedure: RIGHT TOTAL KNEE ARTHROPLASTY;  Surgeon: Newt Minion, MD;  Location: [facility];  Service: Orthopedics;  Laterality: Right;  . TOTAL KNEE ARTHROPLASTY Left 12/07/2017  . TOTAL  KNEE ARTHROPLASTY Left 12/07/2017   Procedure: LEFT TOTAL KNEE ARTHROPLASTY;  Surgeon: Newt Minion, MD;  Location: [facility];  Service: Orthopedics;  Laterality: Left;  . TRACHEOSTOMY TUBE PLACEMENT N/A 09/20/2017   Procedure: AWAKE INTUBATION WITH ANESTHESIA WITH VIDEO ASSISTANCE;  Surgeon: Helayne Seminole, MD;  Location: [facility];  Service: ENT;  Laterality: N/A;  . TRANSTHORACIC ECHOCARDIOGRAM  08/2014   Normal LV size and function.  Mild LVH.  EF 55-60%.  Normal regional wall motion.  GR 1 DD.  Normal RV size and function .  [name] TUBAL LIGATION     with reversal in 1994  . VENTRAL HERNIA REPAIR      Social History   Socioeconomic History  . Marital status: Married    Spouse name: IVRY PIGUE  . Number of children: 2  . Years of education: 66  . Highest education level: Not on file  Occupational History  . Occupation: DISABLED  Tobacco Use  . Smoking  status: Never Smoker  . Smokeless tobacco: Never Used  Vaping Use  . Vaping Use: Never used  Substance and Sexual Activity  . Alcohol use: No    Alcohol/week: 0.0 standard drinks  . Drug use: No  . Sexual activity: Yes    Birth control/protection: Surgical  Other Topics Concern  . Not on file  Social History Narrative   Married 1994. 2 sons who both live close and 1 grandson.       Disability due to sarcoidosis. 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 Difficulty of Paying Living Expenses:   Food Insecurity:   . Worried About Charity fundraiser in the Last Year:   . Arboriculturist in the Last Year:   Transportation Needs:   . Film/video editor (Medical):   [name] Lack of Transportation (Non-Medical):   Physical Activity:   . Days of Exercise per Week:   . Minutes of Exercise per Session:   Stress:   .  Feeling of Stress :   Social Connections:   . Frequency of Communication with Friends and Family:   . Frequency of Social Gatherings with Friends and Family:   . Attends Religious Services:   . Active Member of Clubs or Organizations:   . Attends Archivist Meetings:   [name] Marital Status:   Intimate Partner Violence:   . Fear of Current or Ex-Partner:   . Emotionally Abused:   [name] Physically Abused:   . Sexually Abused:     Current Outpatient Medications on File Prior to Visit  Medication Sig Dispense Refill  . albuterol (PROVENTIL HFA;VENTOLIN HFA) 108 (90 Base) MCG/ACT inhaler Inhale 1-2 puffs into the lungs every 6 (six) hours as needed for wheezing or shortness of breath. 8 g 5  . allopurinol (ZYLOPRIM) 100 MG tablet Take 100 mg by mouth every evening.     [name] aspirin EC 81 MG tablet Take 81 mg by mouth daily.     [name] atorvastatin (LIPITOR) 40 MG tablet TAKE 1 TABLET BY MOUTH EVERY DAY 90 tablet 3  . BREO ELLIPTA 200-25 MCG/INH AEPB TAKE 1 PUFF BY MOUTH EVERY DAY 60 each 5  . buPROPion (WELLBUTRIN XL) 300 MG 24 hr tablet TAKE 1 TABLET BY MOUTH EVERY DAY 90 tablet 3  . carvedilol (COREG) 25 MG tablet TAKE 1 TABLET (25 MG TOTAL) BY MOUTH 2 (TWO) TIMES DAILY WITH A MEAL. 180 tablet 3  . chlorpheniramine-HYDROcodone (TUSSIONEX PENNKINETIC ER) 10-8 MG/5ML SUER Take 5 mLs by mouth 2 (two) times daily. 140 mL 0  . dexlansoprazole (DEXILANT) 60 MG capsule Take 1 capsule (60 mg total) by mouth daily. 30 capsule 5  . diclofenac Sodium (VOLTAREN) 1 % GEL Apply 1 application topically 4 (four) times daily as needed (pain.).    [name] diltiazem (CARDIZEM CD) 120 MG 24 hr capsule TAKE 1 CAPSULE BY MOUTH EVERY DAY 90 capsule 0  . famotidine (PEPCID) 20 MG tablet Take 1 tablet (20 mg total) by mouth at bedtime. 30 tablet 3  . fenofibrate 160 MG tablet TAKE 1 TABLET BY MOUTH EVERY DAY 90 tablet 1  . fluticasone (FLONASE) 50 MCG/ACT nasal spray Place 2 sprays into both nostrils daily as needed for  allergies. 48 mL 3  .  furosemide (LASIX) 40 MG tablet TAKE 1 TABLET BY MOUTH TWICE A DAY 180 tablet 3  . gabapentin (NEURONTIN) 100 MG capsule TAKE 1 CAPSULE BY MOUTH THREE TIMES A DAY 270 capsule 3  . glucose blood (CONTOUR NEXT TEST) test strip 1 each by Other route 4 (four) times daily. And lancets 4/day 400 each 3  . icosapent Ethyl (VASCEPA) 1 g capsule Take 2 capsules (2 g total) by mouth 2 (two) times daily. 120 capsule 11  . Insulin Pen Needle (PEN NEEDLES) 32G X 4 MM MISC 1 each by Does not apply route 4 (four) times daily. E11.9 400 each 0  . isosorbide mononitrate (IMDUR) 30 MG 24 hr tablet TAKE 1 TABLET (30 MG TOTAL) BY MOUTH AT BEDTIME. 90 tablet 1  . KLOR-CON M20 20 MEQ tablet TAKE 1 & 1/2 TABLETS BY MOUTH TWICE A DAY 270 tablet 2  . lactulose (CHRONULAC) 10 GM/15ML solution Take 10 g by mouth daily as needed (constipation).     [name] linaclotide (LINZESS) 290 MCG CAPS capsule Take 1 capsule (290 mcg total) by mouth daily before breakfast. 30 capsule 11  . LORazepam (ATIVAN) 0.5 MG tablet Take 0.5 mg by mouth every 12 (twelve) hours as needed for anxiety.    . metFORMIN (GLUCOPHAGE) 1000 MG tablet TAKE 1 TABLET BY MOUTH 2 TIMES DAILY WITH A MEAL. MUST CALL TO SCHEDULE AN APPT. 180 tablet 1  . Microlet Lancets MISC 1 each by Does not apply route 2 (two) times daily. E11.9 100 each 2  . nitroGLYCERIN (NITROSTAT) 0.4 MG SL tablet Place 1 tablet (0.4 mg total) under the tongue every 5 (five) minutes as needed for chest pain. Reported on 01/05/2016 25 tablet 6  . ondansetron (ZOFRAN-ODT) 4 MG disintegrating tablet Take 1 tablet (4 mg total) by mouth every 8 (eight) hours as needed for nausea or vomiting. 50 tablet 2  . oxyCODONE-acetaminophen (PERCOCET) 5-325 MG tablet Take 1 tablet by mouth every 6 (six) hours as needed for severe pain. 30 tablet 0  . predniSONE (DELTASONE) 5 MG tablet Take 5 mg by mouth daily with breakfast.    . telmisartan (MICARDIS) 20 MG tablet TAKE 1 TABLET BY MOUTH EVERY  DAY 90 tablet 1  . triamcinolone cream (KENALOG) 0.1 % Apply 1 application topically 2 (two) times daily as needed (rash/irritation.).    [name] venlafaxine XR (EFFEXOR-XR) 75 MG 24 hr capsule TAKE 1 CAPSULE BY MOUTH TWICE A DAY 180 capsule 3  . Vitamin D, Ergocalciferol, (DRISDOL) 1.25 MG (50000 UNIT) CAPS capsule Take 1 capsule (50,000 Units total) by mouth every 7 (seven) days. 4 capsule 0  . [DISCONTINUED] mupirocin nasal ointment (BACTROBAN) 2 % Place 1 application into the nose 2 (two) times daily. Use one-half of tube in each nostril twice daily for five (5) days. After application, press sides of nose together and gently massage. 10 g 0   No current facility-administered medications on file prior to visit.    Allergies  Allergen Reactions  . Methotrexate Other (See Comments)    Peri-oral and buccal lesions  . Vancomycin Other (See Comments)    DOSE RELATED NEPHROTOXICITY  . Lisinopril Cough  . Chlorhexidine Itching  . Clindamycin/Lincomycin Nausea And Vomiting and Rash  . Doxycycline Rash  . Teflaro [Ceftaroline] Rash and Other (See Comments)    Tolerates ceftriaxone     Family History  Problem Relation Age of Onset  . Diabetes Father   . Heart attack Father 10  . Coronary artery disease  Father   . Heart failure Father   . Throat cancer Father   . Hypertension Father   . Heart disease Father   . Sleep apnea Father   . Obesity Father   . COPD Mother   . Emphysema Mother   . Asthma Mother   . Heart failure Mother   . Breast cancer Mother   . Diabetes Mother   . Kidney disease Mother   . Thyroid disease Mother   . Heart attack Maternal Grandfather   . Sarcoidosis Maternal Uncle   . Lung cancer Brother   . Diabetes Brother   . Colon cancer Neg Hx   . Rectal cancer Neg Hx     BP 138/60 (BP Location: Left Arm, Patient Position: Sitting, Cuff Size: Normal)   Pulse 74   Ht 5\' 6"  (1.676 m)   Wt 234 lb 9.6 oz (106.4 kg)   SpO2 98%   BMI 37.87 kg/m    Review of  Systems She denies hypoglycemia    Objective:   Physical Exam VITAL SIGNS:  See vs page GENERAL: no distress Pulses: dorsalis pedis intact bilat.   MSK: no deformity of the feet CV: trace bilat leg edema Skin:  no ulcer on the feet.  normal color and temp on the feet. Neuro: sensation is intact to touch on the feet  Lab Results  Component Value Date   HGBA1C 6.7 (A) 04/30/2020       Assessment & Plan:  HTN: is noted today Insulin-requiring type 2 DM, with PN: overcontrolled, given this regimen, which does match insulin to her changing needs throughout the day.   Patient Instructions  Please reduce the insulins to the numbers listed below, and:  I have sent a prescription to your pharmacy, to double the Trulicity again. Your blood pressure is high today.  Please see your primary care provider soon, to have it rechecked check your blood sugar 4 times a day: before the 3 meals, and at bedtime.  also check if you have symptoms of your blood sugar being too high or too low.  please keep a record of the readings and bring it to your next appointment here (or you can bring the meter itself).  You can write it on any piece of paper.  please call us sooner if your blood sugar goes below 70, or if you have a lot of readings over 200.   Please come back for a follow-up appointment in 2-3 months.

## 2020-04-30 NOTE — Patient Instructions (Addendum)
Please reduce the insulins to the numbers listed below, and:  I have sent a prescription to your pharmacy, to double the Trulicity again. Your blood pressure is high today.  Please see your primary care provider soon, to have it rechecked check your blood sugar 4 times a day: before the 3 meals, and at bedtime.  also check if you have symptoms of your blood sugar being too high or too low.  please keep a record of the readings and bring it to your next appointment here (or you can bring the meter itself).  You can write it on any piece of paper.  please call us sooner if your blood sugar goes below 70, or if you have a lot of readings over 200.   Please come back for a follow-up appointment in 2-3 months.

## 2020-05-05 ENCOUNTER — Other Ambulatory Visit: Payer: Self-pay | Admitting: Endocrinology

## 2020-05-05 DIAGNOSIS — E1129 Type 2 diabetes mellitus with other diabetic kidney complication: Secondary | ICD-10-CM

## 2020-05-05 DIAGNOSIS — Z794 Long term (current) use of insulin: Secondary | ICD-10-CM

## 2020-05-06 ENCOUNTER — Other Ambulatory Visit (INDEPENDENT_AMBULATORY_CARE_PROVIDER_SITE_OTHER): Payer: Self-pay | Admitting: Bariatrics

## 2020-05-06 DIAGNOSIS — E559 Vitamin D deficiency, unspecified: Secondary | ICD-10-CM

## 2020-05-07 ENCOUNTER — Telehealth: Payer: 59 | Admitting: Physician Assistant

## 2020-05-09 ENCOUNTER — Telehealth (INDEPENDENT_AMBULATORY_CARE_PROVIDER_SITE_OTHER): Payer: 59 | Admitting: Physician Assistant

## 2020-05-09 ENCOUNTER — Encounter: Payer: Self-pay | Admitting: Physician Assistant

## 2020-05-09 ENCOUNTER — Other Ambulatory Visit: Payer: Self-pay

## 2020-05-09 VITALS — Ht 66.0 in | Wt 234.0 lb

## 2020-05-09 DIAGNOSIS — M545 Low back pain, unspecified: Secondary | ICD-10-CM

## 2020-05-09 DIAGNOSIS — R399 Unspecified symptoms and signs involving the genitourinary system: Secondary | ICD-10-CM

## 2020-05-09 LAB — POC URINALSYSI DIPSTICK (AUTOMATED)
Bilirubin, UA: NEGATIVE
Blood, UA: NEGATIVE
Glucose, UA: NEGATIVE
Ketones, UA: NEGATIVE
Leukocytes, UA: NEGATIVE
Nitrite, UA: NEGATIVE
Protein, UA: NEGATIVE
Spec Grav, UA: 1.025 (ref 1.010–1.025)
Urobilinogen, UA: 0.2 E.U./dL
pH, UA: 5 (ref 5.0–8.0)

## 2020-05-09 MED ORDER — CEPHALEXIN 500 MG PO CAPS
500.0000 mg | ORAL_CAPSULE | Freq: Two times a day (BID) | ORAL | 0 refills | Status: AC
Start: 2020-05-09 — End: 2020-05-16

## 2020-05-09 NOTE — Progress Notes (Signed)
Virtual Visit via Video   I connected with [name] on 05/09/20 at  4:00 PM EDT by a video enabled telemedicine application and verified that I am speaking with the correct person using two identifiers. Location patient: Home Location provider: [facility], Office Persons participating in the virtual visit: [name], [place] PA-C    I discussed the limitations of evaluation and management by telemedicine and the availability of in person appointments. The patient expressed understanding and agreed to proceed.  Subjective:   HPI:   Back pain Patient reports that she is having back pain.  She does have a history of back pain.  She states that she is also experiencing some urinary discomfort, has pain but no burning.   Denies: fever, chills, hematuria, nausea, vomiting, malaise.  She did take Tylenol without any significant improvement of her symptoms.  Her symptoms have been going on for a week.  ROS: See pertinent positives and negatives per HPI.  Patient Active Problem List   Diagnosis Date Noted  . Hypertriglyceridemia 04/02/2020  . Aortic atherosclerosis ([place]) 02/14/2020  . Nontraumatic complete tear of left rotator cuff   . Diabetic neuropathy ([place]) 02/20/2019  . Severe persistent asthma 01/04/2019  . Subcortical microvascular ischemic occlusive disease 07/12/2018  . Rapid palpitations 07/12/2018  . Impingement of left ankle joint 06/26/2018  . Constipation 04/21/2018  . Total knee replacement status, left 12/07/2017  . Dysphagia 09/20/2017  . Epiglottitis   . Plantar fasciitis, right 07/13/2017  . S/P total knee arthroplasty, right 06/29/2017  . Osteoarthrosis, localized, primary, knee, right   . Sprain of calcaneofibular ligament of right ankle 06/06/2017  . Osteoarthritis of right knee 01/26/2017  . Onychomycosis 10/27/2015  . CKD (chronic kidney disease) stage 3, GFR 30-59 ml/min 04/27/2015  . MRSA (methicillin resistant staph aureus)  culture positive 03/27/2015  . History of osteomyelitis 03/27/2015  . Fatty liver 09/30/2014  . Hot flashes 07/15/2014  . Abnormal SPEP 04/17/2014  . Fracture of left leg 04/17/2014  . Cushingoid side effect of steroids ([place]) 04/17/2014  . Internal hemorrhoids   . Major depression in full remission ([place])   . Preoperative clearance 03/25/2014  . Obstructive chronic bronchitis without exacerbation ([place]) 09/18/2013  . Chest pain 04/11/2013  . Hypertensive heart disease with chronic diastolic congestive heart failure ([place])   . Solitary pulmonary nodule, on CT 02/2013 - stable over 2 years in 2015 02/20/2013  . Gout 08/20/2010  . Anemia 09/18/2009  . Morbid obesity ([place]) 06/04/2009  . Sleep apnea 04/21/2009  . Sarcoidosis of lung ([place]) 04/10/2007  . Hyperlipidemia associated with type 2 diabetes mellitus ([place]) 08/21/2006  . Hypertension associated with diabetes ([place]) 08/21/2006  . GERD 08/21/2006  . Type II diabetes mellitus with renal manifestations ([place]) 08/21/2006    Social History   Tobacco Use  . Smoking status: Never Smoker  . Smokeless tobacco: Never Used  Substance Use Topics  . Alcohol use: No    Alcohol/week: 0.0 standard drinks    Current Outpatient Medications:  .  albuterol (PROVENTIL HFA;VENTOLIN HFA) 108 (90 Base) MCG/ACT inhaler, Inhale 1-2 puffs into the lungs every 6 (six) hours as needed for wheezing or shortness of breath., Disp: 8 g, Rfl: 5 .  allopurinol (ZYLOPRIM) 100 MG tablet, Take 100 mg by mouth every evening. , Disp: , Rfl:  .  aspirin EC 81 MG tablet, Take 81 mg by mouth daily. , Disp: , Rfl:  .  atorvastatin (LIPITOR) 40 MG tablet, TAKE  1 TABLET BY MOUTH EVERY DAY, Disp: 90 tablet, Rfl: 3 .  BREO ELLIPTA 200-25 MCG/INH AEPB, TAKE 1 PUFF BY MOUTH EVERY DAY, Disp: 60 each, Rfl: 5 .  buPROPion (WELLBUTRIN XL) 300 MG 24 hr tablet, TAKE 1 TABLET BY MOUTH EVERY DAY, Disp: 90 tablet, Rfl: 3 .  carvedilol (COREG) 25 MG tablet, TAKE 1 TABLET (25 MG TOTAL) BY  MOUTH 2 (TWO) TIMES DAILY WITH A MEAL., Disp: 180 tablet, Rfl: 3 .  chlorpheniramine-HYDROcodone (TUSSIONEX PENNKINETIC ER) 10-8 MG/5ML SUER, Take 5 mLs by mouth 2 (two) times daily., Disp: 140 mL, Rfl: 0 .  dexlansoprazole (DEXILANT) 60 MG capsule, Take 1 capsule (60 mg total) by mouth daily., Disp: 30 capsule, Rfl: 5 .  diclofenac Sodium (VOLTAREN) 1 % GEL, Apply 1 application topically 4 (four) times daily as needed (pain.)., Disp: , Rfl:  .  diltiazem (CARDIZEM CD) 120 MG 24 hr capsule, TAKE 1 CAPSULE BY MOUTH EVERY DAY, Disp: 90 capsule, Rfl: 0 .  Dulaglutide (TRULICITY) 3 IP/3.8SN SOPN, Inject 0.5 mLs (3 mg total) into the skin once a week., Disp: 12 pen, Rfl: 3 .  famotidine (PEPCID) 20 MG tablet, Take 1 tablet (20 mg total) by mouth at bedtime., Disp: 30 tablet, Rfl: 3 .  fenofibrate 160 MG tablet, TAKE 1 TABLET BY MOUTH EVERY DAY, Disp: 90 tablet, Rfl: 1 .  fluticasone (FLONASE) 50 MCG/ACT nasal spray, Place 2 sprays into both nostrils daily as needed for allergies., Disp: 48 mL, Rfl: 3 .  furosemide (LASIX) 40 MG tablet, TAKE 1 TABLET BY MOUTH TWICE A DAY, Disp: 180 tablet, Rfl: 3 .  gabapentin (NEURONTIN) 100 MG capsule, TAKE 1 CAPSULE BY MOUTH THREE TIMES A DAY, Disp: 270 capsule, Rfl: 3 .  glucose blood (CONTOUR NEXT TEST) test strip, 1 each by Other route 4 (four) times daily. And lancets 4/day, Disp: 400 each, Rfl: 3 .  icosapent Ethyl (VASCEPA) 1 g capsule, Take 2 capsules (2 g total) by mouth 2 (two) times daily., Disp: 120 capsule, Rfl: 11 .  insulin glargine (LANTUS SOLOSTAR) 100 UNIT/ML Solostar Pen, Inject 30 Units into the skin every morning., Disp: 18 mL, Rfl: 2 .  insulin lispro (HUMALOG KWIKPEN) 100 UNIT/ML KwikPen, Inject 0.15 mLs (15 Units total) into the skin daily with supper., Disp: 15 mL, Rfl: 29 .  Insulin Pen Needle (PEN NEEDLES) 32G X 4 MM MISC, 1 each by Does not apply route 4 (four) times daily. E11.9, Disp: 400 each, Rfl: 0 .  isosorbide mononitrate (IMDUR) 30 MG  24 hr tablet, TAKE 1 TABLET (30 MG TOTAL) BY MOUTH AT BEDTIME., Disp: 90 tablet, Rfl: 1 .  KLOR-CON M20 20 MEQ tablet, TAKE 1 & 1/2 TABLETS BY MOUTH TWICE A DAY, Disp: 270 tablet, Rfl: 2 .  lactulose (CHRONULAC) 10 GM/15ML solution, Take 10 g by mouth daily as needed (constipation). , Disp: , Rfl:  .  linaclotide (LINZESS) 290 MCG CAPS capsule, Take 1 capsule (290 mcg total) by mouth daily before breakfast., Disp: 30 capsule, Rfl: 11 .  LORazepam (ATIVAN) 0.5 MG tablet, Take 0.5 mg by mouth every 12 (twelve) hours as needed for anxiety., Disp: , Rfl:  .  metFORMIN (GLUCOPHAGE) 1000 MG tablet, TAKE 1 TABLET BY MOUTH 2 TIMES DAILY WITH A MEAL. MUST CALL TO SCHEDULE AN APPT., Disp: 180 tablet, Rfl: 1 .  Microlet Lancets MISC, 1 each by Does not apply route 2 (two) times daily. E11.9, Disp: 100 each, Rfl: 2 .  nitroGLYCERIN (NITROSTAT) 0.4  MG SL tablet, Place 1 tablet (0.4 mg total) under the tongue every 5 (five) minutes as needed for chest pain. Reported on 01/05/2016, Disp: 25 tablet, Rfl: 6 .  ondansetron (ZOFRAN-ODT) 4 MG disintegrating tablet, Take 1 tablet (4 mg total) by mouth every 8 (eight) hours as needed for nausea or vomiting., Disp: 50 tablet, Rfl: 2 .  oxyCODONE-acetaminophen (PERCOCET) 5-325 MG tablet, Take 1 tablet by mouth every 6 (six) hours as needed for severe pain., Disp: 30 tablet, Rfl: 0 .  predniSONE (DELTASONE) 5 MG tablet, Take 5 mg by mouth daily with breakfast., Disp: , Rfl:  .  telmisartan (MICARDIS) 20 MG tablet, TAKE 1 TABLET BY MOUTH EVERY DAY, Disp: 90 tablet, Rfl: 1 .  triamcinolone cream (KENALOG) 0.1 %, Apply 1 application topically 2 (two) times daily as needed (rash/irritation.)., Disp: , Rfl:  .  venlafaxine XR (EFFEXOR-XR) 75 MG 24 hr capsule, TAKE 1 CAPSULE BY MOUTH TWICE A DAY, Disp: 180 capsule, Rfl: 3 .  Vitamin D, Ergocalciferol, (DRISDOL) 1.25 MG (50000 UNIT) CAPS capsule, Take 1 capsule (50,000 Units total) by mouth every 7 (seven) days., Disp: 4 capsule, Rfl:  0 .  cephALEXin (KEFLEX) 500 MG capsule, Take 1 capsule (500 mg total) by mouth 2 (two) times daily for 7 days., Disp: 14 capsule, Rfl: 0  Allergies  Allergen Reactions  . Methotrexate Other (See Comments)    Peri-oral and buccal lesions  . Vancomycin Other (See Comments)    DOSE RELATED NEPHROTOXICITY  . Lisinopril Cough  . Chlorhexidine Itching  . Clindamycin/Lincomycin Nausea And Vomiting and Rash  . Doxycycline Rash  . Teflaro [Ceftaroline] Rash and Other (See Comments)    Tolerates ceftriaxone     Objective:   VITALS: Per patient if applicable, see vitals. GENERAL: Alert, appears well and in no acute distress. HEENT: Atraumatic, conjunctiva clear, no obvious abnormalities on inspection of external nose and ears. NECK: Normal movements of the head and neck. CARDIOPULMONARY: No increased WOB. Speaking in clear sentences. I:E ratio WNL.  MS: Moves all visible extremities without noticeable abnormality. PSYCH: Pleasant and cooperative, well-groomed. Speech normal rate and rhythm. Affect is appropriate. Insight and judgement are appropriate. Attention is focused, linear, and appropriate.  NEURO: CN grossly intact. Oriented as arrived to appointment on time with no prompting. Moves both UE equally.  SKIN: No obvious lesions, wounds, erythema, or cyanosis noted on face or hands.  Results for orders placed or performed in visit on 05/09/20  POCT Urinalysis Dipstick (Automated)  Result Value Ref Range   Color, UA Yellow    Clarity, UA Clear    Glucose, UA Negative Negative   Bilirubin, UA Negative    Ketones, UA Negative    Spec Grav, UA 1.025 1.010 - 1.025   Blood, UA Negative    pH, UA 5.0 5.0 - 8.0   Protein, UA Negative Negative   Urobilinogen, UA 0.2 0.2 or 1.0 E.U./dL   Nitrite, UA Negative    Leukocytes, UA Negative Negative   *Note: Due to a large number of results and/or encounters for the requested time period, some results have not been displayed. A complete set of  results can be found in Results Review.    Assessment and Plan:   Iridessa was seen today for back pain.  Diagnoses and all orders for this visit:  Acute bilateral low back pain without sciatica; Lower urinary tract symptoms No red flags on discussion.  Urinalysis was unrevealing.  She does have a history of UTIs  that grow E. coli and in the setting of current symptoms, I would like to go ahead and empirically start Keflex to cover for possible UTI. Will contact patient with culture results.  I did explain that if she feels worse over the weekend that she does need to proceed with in person care.  Patient verbalized understanding to plan. -     POCT Urinalysis Dipstick (Automated) -     Urine Culture; Future  Other orders -     cephALEXin (KEFLEX) 500 MG capsule; Take 1 capsule (500 mg total) by mouth 2 (two) times daily for 7 days.    . Reviewed expectations re: course of current medical issues. . Discussed self-management of symptoms. . Outlined signs and symptoms indicating need for more acute intervention. . Patient verbalized understanding and all questions were answered. [name] Health Maintenance issues including appropriate healthy diet, exercise, and smoking avoidance were discussed with patient. . See orders for this visit as documented in the electronic medical record.  I discussed the assessment and treatment plan with the patient. The patient was provided an opportunity to ask questions and all were answered. The patient agreed with the plan and demonstrated an understanding of the instructions.   The patient was advised to call back or seek an in-person evaluation if the symptoms worsen or if the condition fails to improve as anticipated.   CMA or LPN served as scribe during this visit. History, Physical, and Plan performed by medical provider. The above documentation has been reviewed and is accurate and complete.  [place], [place] 05/09/2020

## 2020-05-11 LAB — URINE CULTURE
MICRO NUMBER:: 10770281
SPECIMEN QUALITY:: ADEQUATE

## 2020-05-12 ENCOUNTER — Other Ambulatory Visit: Payer: Self-pay | Admitting: Obstetrics & Gynecology

## 2020-05-12 ENCOUNTER — Other Ambulatory Visit: Payer: Self-pay | Admitting: Diagnostic Radiology

## 2020-05-12 DIAGNOSIS — Z1231 Encounter for screening mammogram for malignant neoplasm of breast: Secondary | ICD-10-CM

## 2020-05-14 ENCOUNTER — Ambulatory Visit (INDEPENDENT_AMBULATORY_CARE_PROVIDER_SITE_OTHER): Payer: 59 | Admitting: Physician Assistant

## 2020-05-14 ENCOUNTER — Ambulatory Visit: Payer: Self-pay

## 2020-05-14 ENCOUNTER — Encounter: Payer: Self-pay | Admitting: Physician Assistant

## 2020-05-14 DIAGNOSIS — M79671 Pain in right foot: Secondary | ICD-10-CM | POA: Diagnosis not present

## 2020-05-14 NOTE — Progress Notes (Signed)
Office Visit Note   Patient: [name]           Date of Birth: Jan 15, 1961           MRN: [id number] Visit Date: 05/14/2020              Requested by: [name], MD [place],  [place] [id number] PCP: [name], MD  Chief Complaint  Patient presents with  . Right Foot - Follow-up      HPI: This is a pleasant 59 year old woman with a 1 week history of right midfoot pain.  She denies any injury she denies any change in activity or change in shoe wear she points to the dorsum of her foot as being the area that is affected.  She does have a history of gout and takes allopurinol on a regular basis.  She does not remember the last time she had a breakthrough but states she has had symptoms in both feet  Assessment & Plan: Visit Diagnoses:  1. Pain in right foot     Plan: We will draw a uric acid today I will call with results tomorrow.  If elevated we will place her on a course of colchicine  Follow-Up Instructions: No follow-ups on file.   Ortho Exam  Patient is alert, oriented, no adenopathy, well-dressed, normal affect, normal respiratory effort. Right foot no cellulitis mild soft tissue swelling tender over the dorsum of the foot no plantar tenderness overall well-maintained alignment she has some varicosities but is not tender along knees.  No sign of an infective process.  Imaging: No results found. No images are attached to the encounter.  Labs: Lab Results  Component Value Date   HGBA1C 6.7 (A) 04/30/2020   HGBA1C 7.0 (A) 02/07/2020   HGBA1C 9.2 (H) 10/18/2019   ESRSEDRATE 28 06/16/2017   ESRSEDRATE 5 01/26/2017   ESRSEDRATE 27 10/14/2016   CRP 27.1 (H) 06/16/2017   CRP 17.3 (H) 01/26/2017   CRP 24.0 (H) 10/14/2016   LABURIC 5.6 07/04/2019   LABURIC 5.1 01/26/2017   REPTSTATUS 09/22/2017 FINAL 09/20/2017   GRAMSTAIN  08/14/2017    MODERATE WBC PRESENT, PREDOMINANTLY PMN MODERATE BUDDING YEAST SEEN    CULT NO GROUP A  STREP (S.PYOGENES) ISOLATED 09/20/2017   LABORGA PSEUDOMONAS FLUORESCENS 08/14/2017     Lab Results  Component Value Date   ALBUMIN 4.6 03/31/2020   ALBUMIN 4.1 02/11/2020   ALBUMIN 4.1 01/22/2020   LABURIC 5.6 07/04/2019   LABURIC 5.1 01/26/2017    Lab Results  Component Value Date   MG 1.7 09/23/2017   MG 1.9 09/22/2017   MG 1.8 09/21/2017   Lab Results  Component Value Date   VD25OH 35.2 03/31/2020    No results found for: PREALBUMIN CBC EXTENDED Latest Ref Rng & Units 02/11/2020 01/22/2020 10/18/2019  WBC 4.0 - 10.5 K/uL 14.1(H) 10.2 12.4(H)  RBC 3.87 - 5.11 MIL/uL 3.77(L) 3.50(L) 3.82(L)  HGB 12.0 - 15.0 g/dL 10.5(L) 9.9(L) 10.4(L)  HCT 36 - 46 % 33.1(L) 30.0(L) 33.5(L)  PLT 150 - 400 K/uL 340 335.0 371  NEUTROABS 1.4 - 7.7 K/uL - 6.5 -  LYMPHSABS 0.7 - 4.0 K/uL - 2.6 -     There is no height or weight on file to calculate BMI.  Orders:  Orders Placed This Encounter  Procedures  . XR Foot Complete Right  . Uric acid   No orders of the defined types were placed in this encounter.  Procedures: No procedures performed  Clinical Data: No additional findings.  ROS:  All other systems negative, except as noted in the HPI. Review of Systems  Objective: Vital Signs: There were no vitals taken for this visit.  Specialty Comments:  No specialty comments available.  PMFS History: Patient Active Problem List   Diagnosis Date Noted  . Hypertriglyceridemia 04/02/2020  . Aortic atherosclerosis ([place]) 02/14/2020  . Nontraumatic complete tear of left rotator cuff   . Diabetic neuropathy ([place]) 02/20/2019  . Severe persistent asthma 01/04/2019  . Subcortical microvascular ischemic occlusive disease 07/12/2018  . Rapid palpitations 07/12/2018  . Impingement of left ankle joint 06/26/2018  . Constipation 04/21/2018  . Total knee replacement status, left 12/07/2017  . Dysphagia 09/20/2017  . Epiglottitis   . Plantar fasciitis, right 07/13/2017  . S/P total  knee arthroplasty, right 06/29/2017  . Osteoarthrosis, localized, primary, knee, right   . Sprain of calcaneofibular ligament of right ankle 06/06/2017  . Osteoarthritis of right knee 01/26/2017  . Onychomycosis 10/27/2015  . CKD (chronic kidney disease) stage 3, GFR 30-59 ml/min 04/27/2015  . MRSA (methicillin resistant staph aureus) culture positive 03/27/2015  . History of osteomyelitis 03/27/2015  . Fatty liver 09/30/2014  . Hot flashes 07/15/2014  . Abnormal SPEP 04/17/2014  . Fracture of left leg 04/17/2014  . Cushingoid side effect of steroids ([place]) 04/17/2014  . Internal hemorrhoids   . Major depression in full remission ([place])   . Preoperative clearance 03/25/2014  . Obstructive chronic bronchitis without exacerbation ([place]) 09/18/2013  . Chest pain 04/11/2013  . Hypertensive heart disease with chronic diastolic congestive heart failure ([place])   . Solitary pulmonary nodule, on CT 02/2013 - stable over 2 years in 2015 02/20/2013  . Gout 08/20/2010  . Anemia 09/18/2009  . Morbid obesity ([place]) 06/04/2009  . Sleep apnea 04/21/2009  . Sarcoidosis of lung ([place]) 04/10/2007  . Hyperlipidemia associated with type 2 diabetes mellitus ([place]) 08/21/2006  . Hypertension associated with diabetes ([place]) 08/21/2006  . GERD 08/21/2006  . Type II diabetes mellitus with renal manifestations ([place]) 08/21/2006   Past Medical History:  Diagnosis Date  . Abnormal SPEP 04/17/2014  . Acute left ankle pain 01/26/2017  . ANEMIA-UNSPECIFIED 09/18/2009  . Anxiety   . Arthritis   . CHF (congestive heart failure) ([place])   . Chronic diastolic heart failure, NYHA class 2 ([facility])    Normal LVEDP by May 2018  . COPD (chronic obstructive pulmonary disease) ([place])   . Depression   . DIABETES MELLITUS, TYPE II 08/21/2006  . Diabetic osteomyelitis ([place]) 05/29/2015  . Fatty liver   . Fracture of 5th metatarsal    non union  . GERD 18/56/3149   Had fundoplication  . GOUT 08/20/2010  . Hammer toe of right foot     2-5th toes  . Hx of umbilical hernia repair   . HYPERLIPIDEMIA 08/21/2006  . HYPERTENSION 08/21/2006  . Infection of wound due to methicillin resistant Staphylococcus aureus (MRSA)   . Internal hemorrhoids   . Kidney problem   . Multiple allergies 10/14/2016  . OBESITY 06/04/2009  . Onychomycosis 10/27/2015  . Osteomyelitis of left foot ([place]) 05/29/2015  . Pulmonary sarcoidosis ([place])    Followed locally by pulmonology, but also by Dr. Casper Harrison at [facility]  . Right knee pain 01/26/2017  . SOB (shortness of breath)   . Vocal cord dysfunction   . Wears partial dentures     Family History  Problem Relation Age of Onset  .  Diabetes Father   . Heart attack Father 31  . Coronary artery disease Father   . Heart failure Father   . Throat cancer Father   . Hypertension Father   . Heart disease Father   . Sleep apnea Father   . Obesity Father   . COPD Mother   . Emphysema Mother   . Asthma Mother   . Heart failure Mother   . Breast cancer Mother   . Diabetes Mother   . Kidney disease Mother   . Thyroid disease Mother   . Heart attack Maternal Grandfather   . Sarcoidosis Maternal Uncle   . Lung cancer Brother   . Diabetes Brother   . Colon cancer Neg Hx   . Rectal cancer Neg Hx     Past Surgical History:  Procedure Laterality Date  . ABDOMINAL HYSTERECTOMY    . APPENDECTOMY    . BLADDER SUSPENSION  11/11/2011   Procedure: TRANSVAGINAL TAPE (TVT) PROCEDURE;  Surgeon: Olga Millers, MD;  Location: [facility];  Service: Gynecology;  Laterality: N/A;  . CAROTIDS  02/18/11   CAROTID DUPLEX; VERTEBRALS ARE PATENT WITH ANTEGRADE FLOW. ICA/CCA RATIO 1.61 ON RIGHT AND 0.75 ON LEFT  . CHOLECYSTECTOMY  1984  . CYSTOSCOPY  11/11/2011   Procedure: CYSTOSCOPY;  Surgeon: Olga Millers, MD;  Location: [facility];  Service: Gynecology;  Laterality: N/A;  . EXTUBATION (ENDOTRACHEAL) IN OR N/A 09/23/2017   Procedure: EXTUBATION (ENDOTRACHEAL) IN OR;  Surgeon: Helayne Seminole, MD;   Location: [facility];  Service: ENT;  Laterality: N/A;  . FIBEROPTIC LARYNGOSCOPY AND TRACHEOSCOPY N/A 09/23/2017   Procedure: FLEXIBLE FIBEROPTIC LARYNGOSCOPY;  Surgeon: Helayne Seminole, MD;  Location: [facility];  Service: ENT;  Laterality: N/A;  . FRACTURE SURGERY     foot  . HALLUX FUSION Left 10/02/2018   Procedure: HALLUX ARTHRODESIS;  Surgeon: Edrick Kins, DPM;  Location: [facility];  Service: Podiatry;  Laterality: Left;  . HAMMER TOE SURGERY Left 10/02/2018   Procedure: HAMMER TOE CORRECTION 2ND 3RD 4RD FIFTH TOE;  Surgeon: Edrick Kins, DPM;  Location: [facility];  Service: Podiatry;  Laterality: Left;  . HAMMER TOE SURGERY Right 04/12/2019   Procedure: HAMMER TOE CORRECTION, 2ND, 3RD, 4TH AND 5TH TOES OF RIGHT FOOT;  Surgeon: Edrick Kins, DPM;  Location: [facility];  Service: Podiatry;  Laterality: Right;  . HAMMER TOE SURGERY Left 10/25/2019   Procedure: HAMMER TOE REPAIR SECOND, THIRD, FOUTH;  Surgeon: Edrick Kins, DPM;  Location: [facility];  Service: Podiatry;  Laterality: Left;  . HERNIA REPAIR    . I & D EXTREMITY Left 06/27/2015   Procedure: Partial Excision Left Calcaneus, Place Antibiotic Beads, and Wound VAC;  Surgeon: Newt Minion, MD;  Location: [facility];  Service: Orthopedics;  Laterality: Left;  . JOINT REPLACEMENT    . KNEE ARTHROSCOPY     right  . LEFT AND RIGHT HEART CATHETERIZATION WITH CORONARY ANGIOGRAM N/A 04/23/2013   Procedure: LEFT AND RIGHT HEART CATHETERIZATION WITH CORONARY ANGIOGRAM;  Surgeon: Leonie Man, MD;  Location: [facility];  Service: Cardiovascular;  Laterality: N/A;  . LEFT HEART CATH AND CORONARY ANGIOGRAPHY N/A 03/11/2017   Procedure: Left Heart Cath and Coronary Angiography;  Surgeon: Sherren Mocha, MD;  Location: [facility]; angiographically minimal CAD in the LAD otherwise normal.;  Normal LVEDP.  FALSE POSITIVE MYOVIEW  . LEFT HEART CATH AND CORONARY ANGIOGRAPHY  07/20/2010   LVEF 50-55% WITH VERY MILD GLOBAL HYPOKINESIA;  ESSENTIALLY NORMAL  CORONARY ARTERIES; NORMAL LV FUNCTION  . METATARSAL OSTEOTOMY WITH OPEN REDUCTION INTERNAL FIXATION (ORIF) METATARSAL WITH FUSION Left 04/09/2014   Procedure: LEFT FOOT FRACTURE OPEN TREATMENT METATARSAL INCLUDES INTERNAL FIXATION EACH;  Surgeon: Lorn Junes, MD;  Location: [facility];  Service: Orthopedics;  Laterality: Left;  . NISSEN FUNDOPLICATION  8127  . NM MYOVIEW LTD  03/09/2013   Lexiscan --> EF 50%; NORMAL MYOCARDIAL PERFUSION STUDY - breast attenuation  . NM MYOVIEW LTD  03/10/2017   : Moderate size "stress-induced "perfusion defect at the apex as well as "ill-defined stress-induced perfusion defect in the lateral wall.  EF 55%.  INTERMEDIATE risk. -->  FALSE POSITIVE  . Right and left CARDIAC CATHETERIZATION  04/23/2013   Angiographic normal coronaries; LVEDP 20 mmHg, PCWP 12-14 mmHg, RAP 12 mmHg.; Fick CO/CI 4.9/2.2  . SHOULDER ARTHROSCOPY Left 03/14/2019   Procedure: LEFT SHOULDER ARTHROSCOPY, DEBRIDEMENT, AND DECOMPRESSION;  Surgeon: Newt Minion, MD;  Location: [facility];  Service: Orthopedics;  Laterality: Left;  . TOTAL KNEE ARTHROPLASTY Right 06/29/2017   Procedure: RIGHT TOTAL KNEE ARTHROPLASTY;  Surgeon: Newt Minion, MD;  Location: [facility];  Service: Orthopedics;  Laterality: Right;  . TOTAL KNEE ARTHROPLASTY Left 12/07/2017  . TOTAL KNEE ARTHROPLASTY Left 12/07/2017   Procedure: LEFT TOTAL KNEE ARTHROPLASTY;  Surgeon: Newt Minion, MD;  Location: [facility];  Service: Orthopedics;  Laterality: Left;  . TRACHEOSTOMY TUBE PLACEMENT N/A 09/20/2017   Procedure: AWAKE INTUBATION WITH ANESTHESIA WITH VIDEO ASSISTANCE;  Surgeon: Helayne Seminole, MD;  Location: [facility];  Service: ENT;  Laterality: N/A;  . TRANSTHORACIC ECHOCARDIOGRAM  08/2014   Normal LV size and function.  Mild LVH.  EF 55-60%.  Normal regional wall motion.  GR 1 DD.  Normal RV size and function .  [name] TUBAL LIGATION     with reversal in 1994  . VENTRAL HERNIA REPAIR     Social History    Occupational History  . Occupation: DISABLED  Tobacco Use  . Smoking status: Never Smoker  . Smokeless tobacco: Never Used  Vaping Use  . Vaping Use: Never used  Substance and Sexual Activity  . Alcohol use: No    Alcohol/week: 0.0 standard drinks  . Drug use: No  . Sexual activity: Yes    Birth control/protection: Surgical

## 2020-05-15 ENCOUNTER — Other Ambulatory Visit: Payer: Self-pay | Admitting: Physician Assistant

## 2020-05-15 LAB — URIC ACID: Uric Acid, Serum: 6.1 mg/dL (ref 2.5–7.0)

## 2020-05-15 MED ORDER — COLCHICINE 0.6 MG PO CAPS: 0.6000 mg | ORAL_CAPSULE | Freq: Two times a day (BID) | ORAL | 1 refills | Status: DC | PRN

## 2020-05-20 ENCOUNTER — Ambulatory Visit (INDEPENDENT_AMBULATORY_CARE_PROVIDER_SITE_OTHER): Payer: Self-pay | Admitting: Bariatrics

## 2020-05-26 ENCOUNTER — Encounter (INDEPENDENT_AMBULATORY_CARE_PROVIDER_SITE_OTHER): Payer: Self-pay | Admitting: Bariatrics

## 2020-05-26 ENCOUNTER — Other Ambulatory Visit: Payer: Self-pay

## 2020-05-26 ENCOUNTER — Ambulatory Visit (INDEPENDENT_AMBULATORY_CARE_PROVIDER_SITE_OTHER): Payer: 59 | Admitting: Bariatrics

## 2020-05-26 VITALS — BP 126/76 | HR 71 | Temp 97.7°F | Ht 66.0 in | Wt 226.0 lb

## 2020-05-26 DIAGNOSIS — E559 Vitamin D deficiency, unspecified: Secondary | ICD-10-CM

## 2020-05-26 DIAGNOSIS — E1129 Type 2 diabetes mellitus with other diabetic kidney complication: Secondary | ICD-10-CM | POA: Diagnosis not present

## 2020-05-26 DIAGNOSIS — Z9189 Other specified personal risk factors, not elsewhere classified: Secondary | ICD-10-CM | POA: Diagnosis not present

## 2020-05-26 DIAGNOSIS — Z794 Long term (current) use of insulin: Secondary | ICD-10-CM

## 2020-05-26 DIAGNOSIS — Z6836 Body mass index (BMI) 36.0-36.9, adult: Secondary | ICD-10-CM

## 2020-05-27 ENCOUNTER — Other Ambulatory Visit: Payer: Self-pay

## 2020-05-27 ENCOUNTER — Encounter (INDEPENDENT_AMBULATORY_CARE_PROVIDER_SITE_OTHER): Payer: Self-pay | Admitting: Bariatrics

## 2020-05-27 ENCOUNTER — Ambulatory Visit (INDEPENDENT_AMBULATORY_CARE_PROVIDER_SITE_OTHER): Payer: 59 | Admitting: Family Medicine

## 2020-05-27 ENCOUNTER — Encounter: Payer: Self-pay | Admitting: Family Medicine

## 2020-05-27 VITALS — BP 136/64 | HR 83 | Temp 98.2°F | Ht 66.0 in | Wt 231.0 lb

## 2020-05-27 DIAGNOSIS — Z794 Long term (current) use of insulin: Secondary | ICD-10-CM

## 2020-05-27 DIAGNOSIS — Z0000 Encounter for general adult medical examination without abnormal findings: Secondary | ICD-10-CM

## 2020-05-27 DIAGNOSIS — E1129 Type 2 diabetes mellitus with other diabetic kidney complication: Secondary | ICD-10-CM

## 2020-05-27 DIAGNOSIS — R3589 Other polyuria: Secondary | ICD-10-CM

## 2020-05-27 DIAGNOSIS — R358 Other polyuria: Secondary | ICD-10-CM

## 2020-05-27 DIAGNOSIS — E1142 Type 2 diabetes mellitus with diabetic polyneuropathy: Secondary | ICD-10-CM | POA: Diagnosis not present

## 2020-05-27 DIAGNOSIS — Z1211 Encounter for screening for malignant neoplasm of colon: Secondary | ICD-10-CM

## 2020-05-27 LAB — POC URINALSYSI DIPSTICK (AUTOMATED)
Bilirubin, UA: NEGATIVE
Blood, UA: NEGATIVE
Glucose, UA: NEGATIVE
Ketones, UA: NEGATIVE
Leukocytes, UA: NEGATIVE
Nitrite, UA: NEGATIVE
Protein, UA: NEGATIVE
Spec Grav, UA: 1.015 (ref 1.010–1.025)
Urobilinogen, UA: 0.2 E.U./dL
pH, UA: 5.5 (ref 5.0–8.0)

## 2020-05-27 MED ORDER — LACTULOSE 10 GM/15ML PO SOLN: 10.0000 g | Freq: Every day | ORAL | 2 refills | Status: DC | PRN

## 2020-05-27 NOTE — Progress Notes (Signed)
Phone [phone]   Subjective:  Patient presents today for their annual physical. Chief complaint-noted.   See problem oriented charting- Review of Systems  Constitutional: Negative for chills and fever.  HENT: Negative for hearing loss and tinnitus.   Eyes: Negative for blurred vision and double vision.  Respiratory: Negative for cough, shortness of breath and wheezing.   Cardiovascular: Negative for chest pain, palpitations and leg swelling.  Gastrointestinal: Positive for constipation and heartburn. Negative for nausea and vomiting.  Genitourinary: Positive for flank pain, frequency and urgency. Negative for dysuria and hematuria.       Started 3 days ago   Musculoskeletal: Negative for back pain, joint pain and neck pain.  Skin: Negative for rash.  Neurological: Negative for dizziness, seizures and weakness.  Endo/Heme/Allergies: Does not bruise/bleed easily.  Psychiatric/Behavioral: Negative for depression, memory loss and suicidal ideas. The patient does not have insomnia.     The following were reviewed and entered/updated in epic: Past Medical History:  Diagnosis Date  . Abnormal SPEP 04/17/2014  . Acute left ankle pain 01/26/2017  . ANEMIA-UNSPECIFIED 09/18/2009  . Anxiety   . Arthritis   . CHF (congestive heart failure) ([place])   . Chronic diastolic heart failure, NYHA class 2 (HCC)    Normal LVEDP by May 2018  . COPD (chronic obstructive pulmonary disease) ([place])   . Depression   . DIABETES MELLITUS, TYPE II 08/21/2006  . Diabetic osteomyelitis ([place]) 05/29/2015  . Fatty liver   . Fracture of 5th metatarsal    non union  . GERD 62/13/0865   Had fundoplication  . GOUT 08/20/2010  . Hammer toe of right foot    2-5th toes  . Hx of umbilical hernia repair   . HYPERLIPIDEMIA 08/21/2006  . HYPERTENSION 08/21/2006  . Infection of wound due to methicillin resistant Staphylococcus aureus (MRSA)   . Internal hemorrhoids   . Kidney problem   . Multiple allergies 10/14/2016   . OBESITY 06/04/2009  . Onychomycosis 10/27/2015  . Osteomyelitis of left foot ([place]) 05/29/2015  . Pulmonary sarcoidosis ([place])    Followed locally by pulmonology, but also by Dr. Casper Harrison at [facility]  . Right knee pain 01/26/2017  . SOB (shortness of breath)   . Vocal cord dysfunction   . Wears partial dentures    Patient Active Problem List   Diagnosis Date Noted  . Severe persistent asthma 01/04/2019    Priority: High  . Subcortical microvascular ischemic occlusive disease 07/12/2018    Priority: High  . Obstructive chronic bronchitis without exacerbation ([place]) 09/18/2013    Priority: High  . Chest pain 04/11/2013    Priority: High  . Hypertensive heart disease with chronic diastolic congestive heart failure ([place])     Priority: High  . Sarcoidosis of lung ([place]) 04/10/2007    Priority: High  . Type II diabetes mellitus with renal manifestations ([place]) 08/21/2006    Priority: High  . Constipation 04/21/2018    Priority: Medium  . Epiglottitis     Priority: Medium  . Osteoarthritis of right knee 01/26/2017    Priority: Medium  . CKD (chronic kidney disease) stage 3, GFR 30-59 ml/min 04/27/2015    Priority: Medium  . History of osteomyelitis 03/27/2015    Priority: Medium  . Fatty liver 09/30/2014    Priority: Medium  . Major depression in full remission ([place])     Priority: Medium  . Gout 08/20/2010    Priority: Medium  . Anemia 09/18/2009    Priority: Medium  .  Sleep apnea 04/21/2009    Priority: Medium  . Hyperlipidemia associated with type 2 diabetes mellitus ([place]) 08/21/2006    Priority: Medium  . Hypertension associated with diabetes ([place]) 08/21/2006    Priority: Medium  . Nontraumatic complete tear of left rotator cuff     Priority: Low  . Diabetic neuropathy ([place]) 02/20/2019    Priority: Low  . Rapid palpitations 07/12/2018    Priority: Low  . Impingement of left ankle joint 06/26/2018    Priority: Low  . Total knee replacement status, left  12/07/2017    Priority: Low  . Dysphagia 09/20/2017    Priority: Low  . Plantar fasciitis, right 07/13/2017    Priority: Low  . S/P total knee arthroplasty, right 06/29/2017    Priority: Low  . Osteoarthrosis, localized, primary, knee, right     Priority: Low  . Sprain of calcaneofibular ligament of right ankle 06/06/2017    Priority: Low  . Onychomycosis 10/27/2015    Priority: Low  . MRSA (methicillin resistant staph aureus) culture positive 03/27/2015    Priority: Low  . Hot flashes 07/15/2014    Priority: Low  . Abnormal SPEP 04/17/2014    Priority: Low  . Fracture of left leg 04/17/2014    Priority: Low  . Cushingoid side effect of steroids ([place]) 04/17/2014    Priority: Low  . Internal hemorrhoids     Priority: Low  . Preoperative clearance 03/25/2014    Priority: Low  . Solitary pulmonary nodule, on CT 02/2013 - stable over 2 years in 2015 02/20/2013    Priority: Low  . Morbid obesity ([place]) 06/04/2009    Priority: Low  . GERD 08/21/2006    Priority: Low  . Hypertriglyceridemia 04/02/2020  . Aortic atherosclerosis ([place]) 02/14/2020   Past Surgical History:  Procedure Laterality Date  . ABDOMINAL HYSTERECTOMY    . APPENDECTOMY    . BLADDER SUSPENSION  11/11/2011   Procedure: TRANSVAGINAL TAPE (TVT) PROCEDURE;  Surgeon: Olga Millers, MD;  Location: [facility];  Service: Gynecology;  Laterality: N/A;  . CAROTIDS  02/18/11   CAROTID DUPLEX; VERTEBRALS ARE PATENT WITH ANTEGRADE FLOW. ICA/CCA RATIO 1.61 ON RIGHT AND 0.75 ON LEFT  . CHOLECYSTECTOMY  1984  . CYSTOSCOPY  11/11/2011   Procedure: CYSTOSCOPY;  Surgeon: Olga Millers, MD;  Location: [facility];  Service: Gynecology;  Laterality: N/A;  . EXTUBATION (ENDOTRACHEAL) IN OR N/A 09/23/2017   Procedure: EXTUBATION (ENDOTRACHEAL) IN OR;  Surgeon: Helayne Seminole, MD;  Location: [facility];  Service: ENT;  Laterality: N/A;  . FIBEROPTIC LARYNGOSCOPY AND TRACHEOSCOPY N/A 09/23/2017   Procedure: FLEXIBLE FIBEROPTIC  LARYNGOSCOPY;  Surgeon: Helayne Seminole, MD;  Location: [facility];  Service: ENT;  Laterality: N/A;  . FRACTURE SURGERY     foot  . HALLUX FUSION Left 10/02/2018   Procedure: HALLUX ARTHRODESIS;  Surgeon: Edrick Kins, DPM;  Location: [facility];  Service: Podiatry;  Laterality: Left;  . HAMMER TOE SURGERY Left 10/02/2018   Procedure: HAMMER TOE CORRECTION 2ND 3RD 4RD FIFTH TOE;  Surgeon: Edrick Kins, DPM;  Location: [facility];  Service: Podiatry;  Laterality: Left;  . HAMMER TOE SURGERY Right 04/12/2019   Procedure: HAMMER TOE CORRECTION, 2ND, 3RD, 4TH AND 5TH TOES OF RIGHT FOOT;  Surgeon: Edrick Kins, DPM;  Location: [facility];  Service: Podiatry;  Laterality: Right;  . HAMMER TOE SURGERY Left 10/25/2019   Procedure: HAMMER TOE REPAIR SECOND, THIRD, FOUTH;  Surgeon: Edrick Kins, DPM;  Location: [facility]  OR;  Service: Podiatry;  Laterality: Left;  . HERNIA REPAIR    . I & D EXTREMITY Left 06/27/2015   Procedure: Partial Excision Left Calcaneus, Place Antibiotic Beads, and Wound VAC;  Surgeon: Newt Minion, MD;  Location: [facility];  Service: Orthopedics;  Laterality: Left;  . JOINT REPLACEMENT    . KNEE ARTHROSCOPY     right  . LEFT AND RIGHT HEART CATHETERIZATION WITH CORONARY ANGIOGRAM N/A 04/23/2013   Procedure: LEFT AND RIGHT HEART CATHETERIZATION WITH CORONARY ANGIOGRAM;  Surgeon: Leonie Man, MD;  Location: [facility] CATH LAB;  Service: Cardiovascular;  Laterality: N/A;  . LEFT HEART CATH AND CORONARY ANGIOGRAPHY N/A 03/11/2017   Procedure: Left Heart Cath and Coronary Angiography;  Surgeon: Sherren Mocha, MD;  Location: [facility] CV LAB; angiographically minimal CAD in the LAD otherwise normal.;  Normal LVEDP.  FALSE POSITIVE MYOVIEW  . LEFT HEART CATH AND CORONARY ANGIOGRAPHY  07/20/2010   LVEF 50-55% WITH VERY MILD GLOBAL HYPOKINESIA; ESSENTIALLY NORMAL CORONARY ARTERIES; NORMAL LV FUNCTION  . METATARSAL OSTEOTOMY WITH OPEN REDUCTION INTERNAL FIXATION (ORIF) METATARSAL WITH FUSION Left 04/09/2014     Procedure: LEFT FOOT FRACTURE OPEN TREATMENT METATARSAL INCLUDES INTERNAL FIXATION EACH;  Surgeon: Lorn Junes, MD;  Location: [facility];  Service: Orthopedics;  Laterality: Left;  . NISSEN FUNDOPLICATION  5409  . NM MYOVIEW LTD  03/09/2013   Lexiscan --> EF 50%; NORMAL MYOCARDIAL PERFUSION STUDY - breast attenuation  . NM MYOVIEW LTD  03/10/2017   : Moderate size "stress-induced "perfusion defect at the apex as well as "ill-defined stress-induced perfusion defect in the lateral wall.  EF 55%.  INTERMEDIATE risk. -->  FALSE POSITIVE  . Right and left CARDIAC CATHETERIZATION  04/23/2013   Angiographic normal coronaries; LVEDP 20 mmHg, PCWP 12-14 mmHg, RAP 12 mmHg.; Fick CO/CI 4.9/2.2  . SHOULDER ARTHROSCOPY Left 03/14/2019   Procedure: LEFT SHOULDER ARTHROSCOPY, DEBRIDEMENT, AND DECOMPRESSION;  Surgeon: Newt Minion, MD;  Location: [facility];  Service: Orthopedics;  Laterality: Left;  . TOTAL KNEE ARTHROPLASTY Right 06/29/2017   Procedure: RIGHT TOTAL KNEE ARTHROPLASTY;  Surgeon: Newt Minion, MD;  Location: [facility];  Service: Orthopedics;  Laterality: Right;  . TOTAL KNEE ARTHROPLASTY Left 12/07/2017  . TOTAL KNEE ARTHROPLASTY Left 12/07/2017   Procedure: LEFT TOTAL KNEE ARTHROPLASTY;  Surgeon: Newt Minion, MD;  Location: [facility];  Service: Orthopedics;  Laterality: Left;  . TRACHEOSTOMY TUBE PLACEMENT N/A 09/20/2017   Procedure: AWAKE INTUBATION WITH ANESTHESIA WITH VIDEO ASSISTANCE;  Surgeon: Helayne Seminole, MD;  Location: [facility];  Service: ENT;  Laterality: N/A;  . TRANSTHORACIC ECHOCARDIOGRAM  08/2014   Normal LV size and function.  Mild LVH.  EF 55-60%.  Normal regional wall motion.  GR 1 DD.  Normal RV size and function .  [name] TUBAL LIGATION     with reversal in 1994  . VENTRAL HERNIA REPAIR      Family History  Problem Relation Age of Onset  . Diabetes Father   . Heart attack Father 51  . Coronary artery disease Father   . Heart failure Father   . Throat  cancer Father   . Hypertension Father   . Heart disease Father   . Sleep apnea Father   . Obesity Father   . COPD Mother   . Emphysema Mother   . Asthma Mother   . Heart failure Mother   . Breast cancer Mother   . Diabetes Mother   . Kidney disease  Mother   . Thyroid disease Mother   . Heart attack Maternal Grandfather   . Sarcoidosis Maternal Uncle   . Lung cancer Brother   . Diabetes Brother   . Colon cancer Neg Hx   . Rectal cancer Neg Hx     Medications- reviewed and updated Current Outpatient Medications  Medication Sig Dispense Refill  . albuterol (PROVENTIL HFA;VENTOLIN HFA) 108 (90 Base) MCG/ACT inhaler Inhale 1-2 puffs into the lungs every 6 (six) hours as needed for wheezing or shortness of breath. 8 g 5  . allopurinol (ZYLOPRIM) 100 MG tablet Take 100 mg by mouth every evening.     [name] aspirin EC 81 MG tablet Take 81 mg by mouth daily.     [name] atorvastatin (LIPITOR) 40 MG tablet TAKE 1 TABLET BY MOUTH EVERY DAY 90 tablet 3  . BREO ELLIPTA 200-25 MCG/INH AEPB TAKE 1 PUFF BY MOUTH EVERY DAY 60 each 5  . buPROPion (WELLBUTRIN XL) 300 MG 24 hr tablet TAKE 1 TABLET BY MOUTH EVERY DAY 90 tablet 3  . carvedilol (COREG) 25 MG tablet TAKE 1 TABLET (25 MG TOTAL) BY MOUTH 2 (TWO) TIMES DAILY WITH A MEAL. 180 tablet 3  . chlorpheniramine-HYDROcodone (TUSSIONEX PENNKINETIC ER) 10-8 MG/5ML SUER Take 5 mLs by mouth 2 (two) times daily. 140 mL 0  . Colchicine 0.6 MG CAPS Take 0.6 mg by mouth 2 (two) times daily as needed for up to 4 days (may discontinue if symptoms improve). 20 capsule 1  . dexlansoprazole (DEXILANT) 60 MG capsule Take 1 capsule (60 mg total) by mouth daily. 30 capsule 5  . diclofenac Sodium (VOLTAREN) 1 % GEL Apply 1 application topically 4 (four) times daily as needed (pain.).    [name] diltiazem (CARDIZEM CD) 120 MG 24 hr capsule TAKE 1 CAPSULE BY MOUTH EVERY DAY 90 capsule 0  . Dulaglutide (TRULICITY) 3 GX/2.1JH SOPN Inject 0.5 mLs (3 mg total) into the skin once a week.  12 pen 3  . famotidine (PEPCID) 20 MG tablet Take 1 tablet (20 mg total) by mouth at bedtime. 30 tablet 3  . fenofibrate 160 MG tablet TAKE 1 TABLET BY MOUTH EVERY DAY 90 tablet 1  . fluticasone (FLONASE) 50 MCG/ACT nasal spray Place 2 sprays into both nostrils daily as needed for allergies. 48 mL 3  . furosemide (LASIX) 40 MG tablet TAKE 1 TABLET BY MOUTH TWICE A DAY 180 tablet 3  . gabapentin (NEURONTIN) 100 MG capsule TAKE 1 CAPSULE BY MOUTH THREE TIMES A DAY 270 capsule 3  . glucose blood (CONTOUR NEXT TEST) test strip 1 each by Other route 4 (four) times daily. And lancets 4/day 400 each 3  . icosapent Ethyl (VASCEPA) 1 g capsule Take 2 capsules (2 g total) by mouth 2 (two) times daily. 120 capsule 11  . insulin glargine (LANTUS SOLOSTAR) 100 UNIT/ML Solostar Pen Inject 30 Units into the skin every morning. 18 mL 2  . insulin lispro (HUMALOG KWIKPEN) 100 UNIT/ML KwikPen Inject 0.15 mLs (15 Units total) into the skin daily with supper. 15 mL 29  . Insulin Pen Needle (PEN NEEDLES) 32G X 4 MM MISC 1 each by Does not apply route 4 (four) times daily. E11.9 400 each 0  . isosorbide mononitrate (IMDUR) 30 MG 24 hr tablet TAKE 1 TABLET (30 MG TOTAL) BY MOUTH AT BEDTIME. 90 tablet 1  . KLOR-CON M20 20 MEQ tablet TAKE 1 & 1/2 TABLETS BY MOUTH TWICE A DAY 270 tablet 2  . lactulose (CHRONULAC)  10 GM/15ML solution Take 10 g by mouth daily as needed (constipation).     [name] linaclotide (LINZESS) 290 MCG CAPS capsule Take 1 capsule (290 mcg total) by mouth daily before breakfast. 30 capsule 11  . LORazepam (ATIVAN) 0.5 MG tablet Take 0.5 mg by mouth every 12 (twelve) hours as needed for anxiety.    . metFORMIN (GLUCOPHAGE) 1000 MG tablet TAKE 1 TABLET BY MOUTH 2 TIMES DAILY WITH A MEAL. MUST CALL TO SCHEDULE AN APPT. 180 tablet 1  . Microlet Lancets MISC 1 each by Does not apply route 2 (two) times daily. E11.9 100 each 2  . nitroGLYCERIN (NITROSTAT) 0.4 MG SL tablet Place 1 tablet (0.4 mg total) under the  tongue every 5 (five) minutes as needed for chest pain. Reported on 01/05/2016 25 tablet 6  . ondansetron (ZOFRAN-ODT) 4 MG disintegrating tablet Take 1 tablet (4 mg total) by mouth every 8 (eight) hours as needed for nausea or vomiting. 50 tablet 2  . oxyCODONE-acetaminophen (PERCOCET) 5-325 MG tablet Take 1 tablet by mouth every 6 (six) hours as needed for severe pain. 30 tablet 0  . predniSONE (DELTASONE) 5 MG tablet Take 5 mg by mouth daily with breakfast.    . telmisartan (MICARDIS) 20 MG tablet TAKE 1 TABLET BY MOUTH EVERY DAY 90 tablet 1  . triamcinolone cream (KENALOG) 0.1 % Apply 1 application topically 2 (two) times daily as needed (rash/irritation.).    [name] venlafaxine XR (EFFEXOR-XR) 75 MG 24 hr capsule TAKE 1 CAPSULE BY MOUTH TWICE A DAY 180 capsule 3  . Vitamin D, Ergocalciferol, (DRISDOL) 1.25 MG (50000 UNIT) CAPS capsule Take 1 capsule (50,000 Units total) by mouth every 7 (seven) days. 4 capsule 0   No current facility-administered medications for this visit.    Allergies-reviewed and updated Allergies  Allergen Reactions  . Methotrexate Other (See Comments)    Peri-oral and buccal lesions  . Vancomycin Other (See Comments)    DOSE RELATED NEPHROTOXICITY  . Lisinopril Cough  . Chlorhexidine Itching  . Clindamycin/Lincomycin Nausea And Vomiting and Rash  . Doxycycline Rash  . Teflaro [Ceftaroline] Rash and Other (See Comments)    Tolerates ceftriaxone     Social History   Social History Narrative   Married 1994. 2 sons who both live close and 1 grandson.       Disability due to sarcoidosis. 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 Oropharynx normal Neck: no thyromegaly CV: RRR no murmurs rubs or gallops Lungs: CTAB no crackles, wheeze, rhonchi Abdomen: soft/nontender/nondistended/normal bowel sounds. No rebound or guarding. Obese.  Ext: no edema Skin: warm, dry Neuro: grossly normal, moves all extremities, PERRLA   Assessment and Plan   59 y.o. female presenting for annual physical.  Health Maintenance counseling: 1. Anticipatory guidance: Patient counseled regarding regular dental exams -no q6 months- wears partials and does not get regular checks, eye exams yearly,  avoiding smoking and second hand smoke , limiting alcohol to 1 beverage per day-. Never   2. Risk factor reduction:  Advised patient  of need for regular exercise and diet rich and fruits and vegetables to reduce risk of heart attack and stroke. Exercise-  [employer] daily for about 1 hr . Diet- high protein low carb diet. Working with weight to wellness- on their scales down 14 lbs.  Wt Readings from Last 3 Encounters:  05/27/20 231 lb (104.8 kg)  05/26/20 226 lb (102.5 kg)  05/09/20 (!) 234 lb (106.1 kg)   3. Immunizations/screenings/ancillary studies- recommended Tdap and fall flu shot.  Immunization History  Administered Date(s) Administered  . H1N1 09/24/2008  . Hep A / Hep B 02/20/2018, 04/03/2018, 08/23/2018  . Influenza Inj Mdck Quad With Preservative 07/12/2019  . Influenza Split 07/07/2011, 07/18/2012  . Influenza Whole 08/15/2007, 06/26/2008, 07/23/2009, 07/16/2010  . Influenza, Seasonal, Injecte, Preservative Fre 10/26/2011, 07/18/2012  . Influenza,inj,Quad PF,6+ Mos 06/12/2013, 07/15/2014, 08/08/2015, 06/22/2016, 07/01/2017, 07/14/2018, 07/04/2019  . Influenza-Unspecified 09/24/2008, 07/26/2019  . PFIZER SARS-COV-2 Vaccination 12/26/2019, 01/16/2020  . Pneumococcal Conjugate-13 05/06/2014  . Pneumococcal Polysaccharide-23 09/30/2008, 02/13/2013  . Td 05/21/2010  . Zoster Recombinat (Shingrix)  03/02/2017, 05/02/2017  4. Cervical cancer screening- 10/17/19 with 3 year repeat- goes yearly 5. Breast cancer screening-  breast exam with gynecology and mammogram 05/28/2019 6. Colon cancer screening -  due for colonoscopy- will refer at this time 7. Skin cancer screening- no dermatologist. advised regular sunscreen use. Denies worrisome, changing, or new skin lesions.  8. Birth control/STD check- postmenopausal and monogomous 9. Osteoporosis screening at 70- 11/01/19 normal- consider repeat 2-3 years on prednisone -Never smoker  Status of chronic or acute concerns   # urinary symptoms- frequency and urgency 3 days ago and hurts in right cva area and in lower abdomen. No dysuria.   # headaches- gets headache typically once a week or so.  Recently has been getting them every other day. Pain up to 5-6/10 slightly worse than usual headaches. No changes in her regimen at home. Sleeping ok. Tylenol helping. Staying hydrated. She will let us know if new or worsening symptoms- work on getting good nights rest, avoiding stress (denies recent stressors)   # Depression/hot flashes S: Compliant with Wellbutrin 300 xr, Effexor xr 75mg  and paxil 7.5mg  (for hot flashes).  Depression screen [facility] 2/9 04/07/2020 03/31/2020 02/14/2020  Decreased Interest 0 1 0  Down, Depressed, Hopeless 0 0 0  PHQ - 2 Score 0 1 0  Altered sleeping 0 0 0  Tired, decreased energy 0 1 0  Change in appetite 0 0 0  Feeling bad or failure about yourself  0 0 0  Trouble concentrating 0 0 0  Moving slowly or fidgety/restless 0 0 0  Suicidal thoughts 0 0 0  PHQ-9 Score 0 2 0  Difficult doing work/chores Not difficult at all Not difficult at all Not difficult at all  Some recent data might be hidden  A/P: reasonable control- continue current meds  # hypertension/hypertensive heart disease with chronic diastolic CHF/CKD stage III S: compliant with Lasix 40 mg twice a day, diltiazem extended release 120 mg, carvedilol 25 mg BID,  telmisartan 20mg  daily, imdur 30mg   Renal function stable with GFR of 55 on last check in june A/P: Hypertension is well controlled.  Hypertensive heart disease with chronic diastolic dysfunction appears stable-now increased edema or weight gain.  Chronic kidney disease stage III has been stable-update kidney function with labs today   # hyperlipidemia S:compliant with atorvastatin 40 mg, fenofibrate 160mg .  Despite this triglycerides remain high and she was started on Vascepa by cardiology  she also takes aspirin for primary prevention Lab Results  Component Value Date   CHOL 162 03/31/2020   HDL 40 03/31/2020   LDLCALC 84 03/31/2020   LDLDIRECT 68.0 01/22/2020   TRIG 227 (H) 03/31/2020   CHOLHDL 5 01/22/2020  A/P: LDL slightly above goal of 70 or less-instead of increasing medicine at this time we are going to give her some time to work on healthy eating/regular exercise and recheck this within 6 to 12 months  #Sarcoidosis of lung-follows at [facility] and is on long term prednisone/Obstructive chronic bronchitis without exacerbation /Severe persistent asthma without complication S:  patient compliant with breo 200-25mg  1 puff every day. Albuterol only 1-2x a month recently  Can get into some tough coughing spells when ill and requests tussionex generally at those times. Also uses flonase for allergies A/P: all disease processes appear stable- continue current meds  #Type 2 diabetes mellitus/morbid obesity #Diabetic polyneuropathy associated with type 2 diabetes mellitus #fatty liver - patient compliant with gabapentin 100mg  3x a day for neuropathy. Status stable. Plan continue current meds - follows with endocrinology and on insulin and metformin. Chronic steroids contribute to sugar dysregulation  - for obesity BMI >40 and fatty liver  and Encouraged need for healthy eating, regular exercise, weight loss.  -LFTs have been norma- will continue to trend   #Gout # arthritic issues- s/p  left total knee replacement. ongoign right knee OA S: 0 flares in 6 months on allopurinol 100mg . Has not used colchicine A/P: Appears stable-continue current medications  # Cushingoid side effect of steroids- noted. Continue to monitor.   #Gerd- on high dose dexilant. Also on pepcid  #Other iron deficiency anemia- anemia of chronic disease from multiple comorbidities. Noted - trend cbc. Has seen oncology in past for positive SPEP but ultimately thought related to sarcoid   #constipation/diarrhe issues- has linzess and as back up has lactulose on hand-  wants to have lactuose on hand (has had available for years)- very sparingly  #vitamin D- working on this with healthy weight to wellness Last vitamin D Lab Results  Component Value Date   VD25OH 35.2 03/31/2020   Recommended follow up: 6 months or sooner if needed Future Appointments  Date Time Provider [place]  06/09/2020 11:40 AM Jearld Lesch A, DO [facility] None  06/10/2020 11:00 AM [facility] MM 3 [facility]  07/31/2020  9:00 AM Renato Shin, MD [id number] None   Lab/Order associations: not fasting   ICD-10-CM   1. Preventative health care  Z00.00 CBC with Differential/Platelet    Comprehensive metabolic panel    Lipid panel  2. Type 2 diabetes mellitus with other diabetic kidney complication, with long-term current use of insulin (HCC)  E11.29 CBC with Differential/Platelet   Z79.4 Comprehensive metabolic panel    Lipid panel    Hemoglobin A1c  3. Diabetic polyneuropathy associated with type 2 diabetes mellitus (HCC)  E11.42   4. Screening for colon cancer  Z12.11 Ambulatory referral to Gastroenterology    No orders of the defined types were placed in this encounter.   Return precautions advised.  Garret Reddish, MD

## 2020-05-27 NOTE — Progress Notes (Signed)
Chief Complaint:   OBESITY [name] is here to discuss her progress with her obesity treatment plan along with follow-up of her obesity related diagnoses. [name] is on the Category 4 Plan and states she is following her eating plan approximately 100% of the time. [name] states she is doing cardio/strengthening 60 minutes 3-4 times per week.  Today's visit was #: 4 Starting weight: 239 lbs Starting date: 03/31/2020 Today's weight: 226 lbs Today's date: 05/26/2020 Total lbs lost to date: 13 Total lbs lost since last in-office visit: 5  Interim History: [name] is down an additional 5 lbs since her last visit. She is not struggling with the plan.  Subjective:   Vitamin D deficiency. No nausea, vomiting, or muscle weakness.    Ref. Range 03/31/2020 12:25  Vitamin D, 25-Hydroxy Latest Ref Range: 30.0 - 100.0 ng/mL 35.2   Type 2 diabetes mellitus with other diabetic kidney complication, with long-term current use of insulin ([name]). Mackenzi is taking Trulicity, Lantus, Humalog, and metformin. She denies lows. Fasting blood sugars are in the range of 120's to 130's.  Lab Results  Component Value Date   HGBA1C 6.7 (A) 04/30/2020   HGBA1C 7.0 (A) 02/07/2020   HGBA1C 9.2 (H) 10/18/2019   Lab Results  Component Value Date   MICROALBUR 0.9 10/03/2017   LDLCALC 84 03/31/2020   CREATININE 1.10 (H) 03/31/2020   No results found for: INSULIN  At risk for osteoporosis. [name] is at higher risk of osteopenia and osteoporosis due to Vitamin D deficiency.   Assessment/Plan:   Vitamin D deficiency. Low Vitamin D level contributes to fatigue and are associated with obesity, breast, and colon cancer. She was given a prescription for Drisdol 50,000 IU once weekly #4 with 0 refills and will follow-up for routine testing of Vitamin D, at least 2-3 times per year to avoid over-replacement.  Type 2 diabetes mellitus with other diabetic kidney complication, with long-term current use of insulin  ([name]). Good blood sugar control is important to decrease the likelihood of diabetic complications such as nephropathy, neuropathy, limb loss, blindness, coronary artery disease, and death. Intensive lifestyle modification including diet, exercise and weight loss are the first line of treatment for diabetes. [name] will continue her medications as directed and will continue to monitor blood sugars.  At risk for osteoporosis. [name] was given approximately 15 minutes of osteoporosis prevention counseling today. [name] is at risk for osteopenia and osteoporosis due to her Vitamin D deficiency. She was encouraged to take her Vitamin D and follow her higher calcium diet and increase strengthening exercise to help strengthen her bones and decrease her risk of osteopenia and osteoporosis.  Repetitive spaced learning was employed today to elicit superior memory formation and behavioral change.  Class 2 severe obesity with serious comorbidity and body mass index (BMI) of 36.0 to 36.9 in adult, unspecified obesity type ([name]).  [name] is currently in the action stage of change. As such, her goal is to continue with weight loss efforts. She has agreed to the Category 4 Plan.   She will work on meal planning, intentional eating, and staying adherent to the plan. We discussed eating strategies for vacation.  Exercise goals: All adults should avoid inactivity. Some physical activity is better than none, and adults who participate in any amount of physical activity gain some health benefits.  Behavioral modification strategies: increasing lean protein intake, decreasing simple carbohydrates, increasing vegetables, increasing water intake, decreasing eating out, no skipping meals, meal planning and cooking  strategies, keeping healthy foods in the home and planning for success.  [name] has agreed to follow-up with our clinic in 2 weeks. She was informed of the importance of frequent follow-up visits to maximize her success  with intensive lifestyle modifications for her multiple health conditions.   Objective:   Blood pressure 126/76, pulse 71, temperature 97.7 F (36.5 C), height 5\' 6"  (1.676 m), weight 226 lb (102.5 kg), SpO2 100 %. Body mass index is 36.48 kg/m.  General: Cooperative, alert, well developed, in no acute distress. HEENT: Conjunctivae and lids unremarkable. Cardiovascular: Regular rhythm.  Lungs: Normal work of breathing. Neurologic: No focal deficits.   Lab Results  Component Value Date   CREATININE 1.10 (H) 03/31/2020   BUN 18 03/31/2020   NA 142 03/31/2020   K 4.6 03/31/2020   CL 101 03/31/2020   CO2 26 03/31/2020   Lab Results  Component Value Date   ALT 23 03/31/2020   AST 19 03/31/2020   ALKPHOS 83 03/31/2020   BILITOT 0.3 03/31/2020   Lab Results  Component Value Date   HGBA1C 6.7 (A) 04/30/2020   HGBA1C 7.0 (A) 02/07/2020   HGBA1C 9.2 (H) 10/18/2019   HGBA1C 9.9 (H) 07/04/2019   HGBA1C 8.2 (H) 03/12/2019   No results found for: INSULIN Lab Results  Component Value Date   TSH 1.530 03/31/2020   Lab Results  Component Value Date   CHOL 162 03/31/2020   HDL 40 03/31/2020   LDLCALC 84 03/31/2020   LDLDIRECT 68.0 01/22/2020   TRIG 227 (H) 03/31/2020   CHOLHDL 5 01/22/2020   Lab Results  Component Value Date   WBC 14.1 (H) 02/11/2020   HGB 10.5 (L) 02/11/2020   HCT 33.1 (L) 02/11/2020   MCV 87.8 02/11/2020   PLT 340 02/11/2020   No results found for: IRON, TIBC, FERRITIN  Attestation Statements:   Reviewed by clinician on day of visit: allergies, medications, problem list, medical history, surgical history, family history, social history, and previous encounter notes.  Migdalia Dk, am acting as Location manager for [employer], DO   I have reviewed the above documentation for accuracy and completeness, and I agree with the above. Jearld Lesch, DO

## 2020-05-27 NOTE — Addendum Note (Signed)
Addended by: Francella Solian on: 05/27/2020 04:20 PM   Modules accepted: Orders

## 2020-05-27 NOTE — Patient Instructions (Addendum)
Health Maintenance Due  Topic Date Due  . COLONOSCOPY referral started . We will call you within two weeks about your referral to Dr. [name]. If you do not hear within 3 weeks, give [place] a call.    04/29/2020  . TETANUS/TDAP - recommend doing this at pharmacy 05/21/2020  . INFLUENZA VACCINE - will complete later in flu season (please let us know if you get this at another location so we can update your chart) . We should have vaccination here in 1-2 months - can call back for an appointment.   05/11/2020   Please stop by lab before you go If you have mychart- we will send your results within 3 business days of [place] receiving them.  If you do not have mychart- we will call you about results within 5 business days of [place] receiving them.  *please note we are currently using [facility] which has a longer processing time than [facility] typically so labs may not come back as quickly as in the past *please also note that you will see labs on mychart as soon as they post. I will later go in and write notes on them- will say "notes from Dr. Yong Channel"  Recommended follow up: Return in about 6 months (around 11/27/2020) for follow up- or sooner if needed.

## 2020-05-28 LAB — CBC WITH DIFFERENTIAL/PLATELET
Absolute Monocytes: 899 cells/uL (ref 200–950)
Basophils Absolute: 102 cells/uL (ref 0–200)
Basophils Relative: 0.7 %
Eosinophils Absolute: 537 cells/uL — ABNORMAL HIGH (ref 15–500)
Eosinophils Relative: 3.7 %
HCT: 32 % — ABNORMAL LOW (ref 35.0–45.0)
Hemoglobin: 10.5 g/dL — ABNORMAL LOW (ref 11.7–15.5)
Lymphs Abs: 2813 cells/uL (ref 850–3900)
MCH: 28.5 pg (ref 27.0–33.0)
MCHC: 32.8 g/dL (ref 32.0–36.0)
MCV: 86.7 fL (ref 80.0–100.0)
MPV: 9.8 fL (ref 7.5–12.5)
Monocytes Relative: 6.2 %
Neutro Abs: 10150 cells/uL — ABNORMAL HIGH (ref 1500–7800)
Neutrophils Relative %: 70 %
Platelets: 405 10*3/uL — ABNORMAL HIGH (ref 140–400)
RBC: 3.69 10*6/uL — ABNORMAL LOW (ref 3.80–5.10)
RDW: 13.5 % (ref 11.0–15.0)
Total Lymphocyte: 19.4 %
WBC: 14.5 10*3/uL — ABNORMAL HIGH (ref 3.8–10.8)

## 2020-05-28 LAB — COMPREHENSIVE METABOLIC PANEL
AG Ratio: 2.1 (calc) (ref 1.0–2.5)
ALT: 19 U/L (ref 6–29)
AST: 24 U/L (ref 10–35)
Albumin: 4.2 g/dL (ref 3.6–5.1)
Alkaline phosphatase (APISO): 69 U/L (ref 37–153)
BUN/Creatinine Ratio: 18 (calc) (ref 6–22)
BUN: 20 mg/dL (ref 7–25)
CO2: 28 mmol/L (ref 20–32)
Calcium: 9.4 mg/dL (ref 8.6–10.4)
Chloride: 101 mmol/L (ref 98–110)
Creat: 1.11 mg/dL — ABNORMAL HIGH (ref 0.50–0.99)
Globulin: 2 g/dL (calc) (ref 1.9–3.7)
Glucose, Bld: 146 mg/dL — ABNORMAL HIGH (ref 65–99)
Potassium: 4.9 mmol/L (ref 3.5–5.3)
Sodium: 137 mmol/L (ref 135–146)
Total Bilirubin: 0.5 mg/dL (ref 0.2–1.2)
Total Protein: 6.2 g/dL (ref 6.1–8.1)

## 2020-05-28 LAB — URINE CULTURE
MICRO NUMBER:: 10836631
SPECIMEN QUALITY:: ADEQUATE

## 2020-05-29 ENCOUNTER — Encounter: Payer: Self-pay | Admitting: Internal Medicine

## 2020-05-30 ENCOUNTER — Telehealth: Payer: Self-pay

## 2020-05-30 NOTE — Telephone Encounter (Signed)
Pt would like someone to go over labs with her

## 2020-05-31 IMAGING — DX DG SHOULDER 2+V*L*
2 series · 2 of 2 positions shown · non-contrast
Comparison: None.

CLINICAL DATA: Patient arrived from EMS and states [place]
at [place]. The patient fell onto her left shoulder and is
experiencing pain from the shoulder to the elbow. The patient's
vitals were wnl en route and a sling was applied by EMS.

EXAM:
LEFT HUMERUS - 2+ VIEW; LEFT SHOULDER - 2+ VIEW

[shoulder grashey]
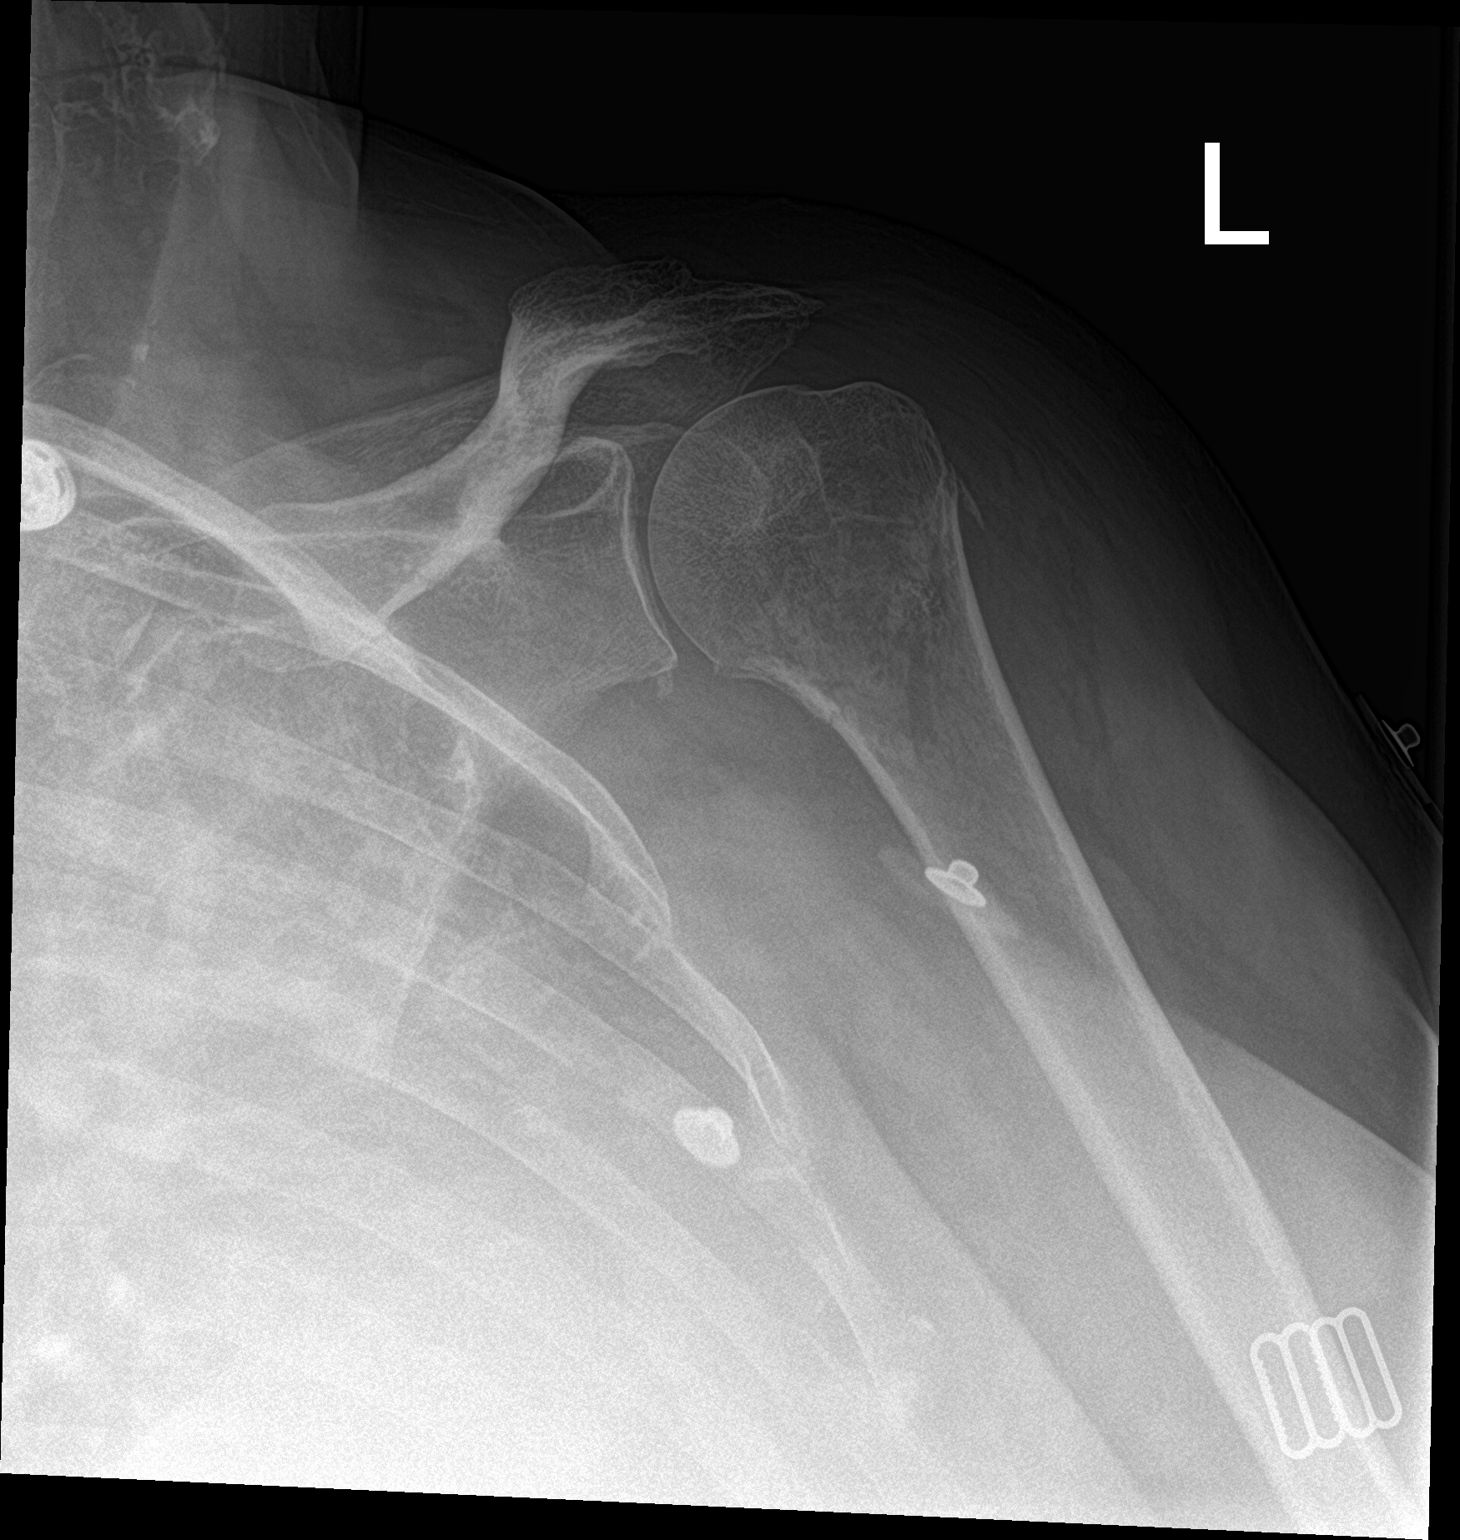

[shoulder y view]
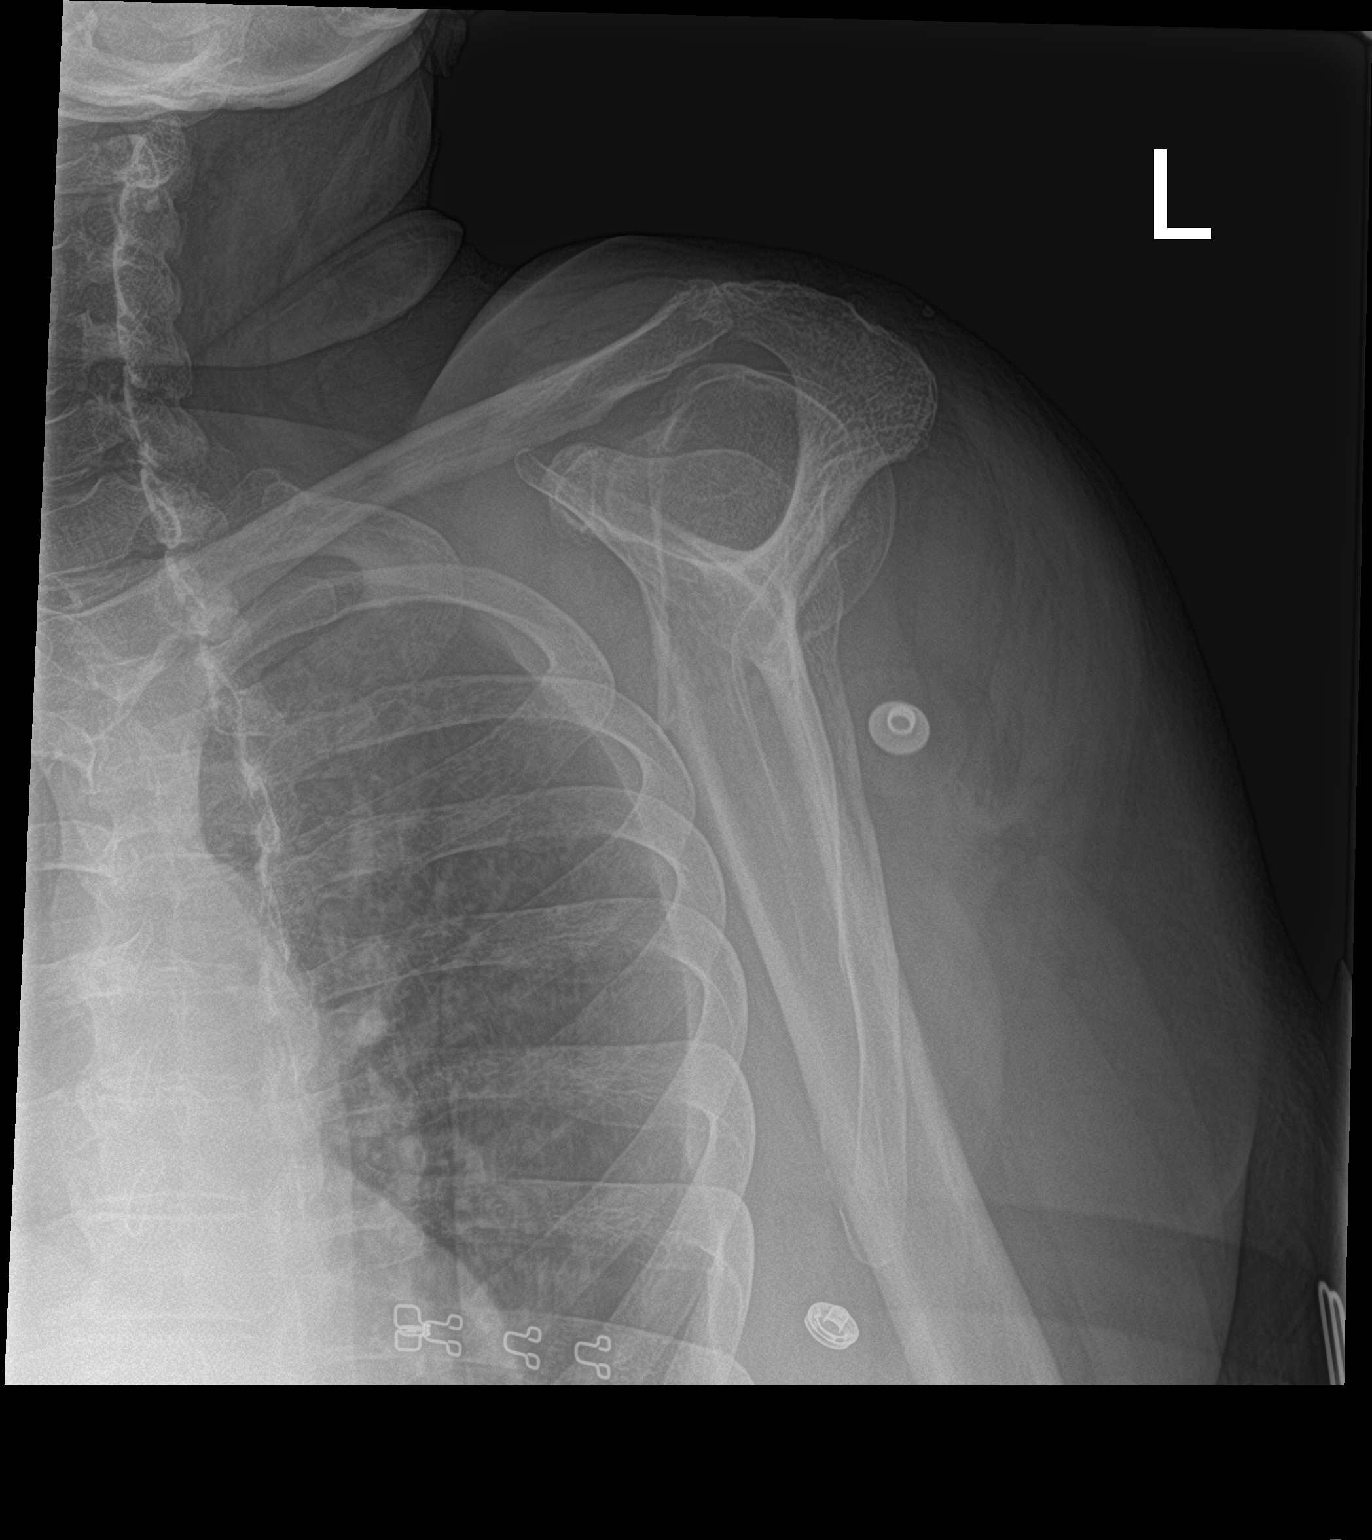

[2 of 2 positions shown; findings below may reference images not displayed]

FINDINGS: Left shoulder: There is an oblique fracture through the neck of the
left proximal humerus with mild displacement. No evidence of
dislocation. No acute finding in the adjacent left ribs or lung.
Regional soft tissues are unremarkable.

Left humerus: No additional acute abnormality in the remainder of
the left humerus. Normal articulation at the elbow joint.
IMPRESSION: Oblique fracture through the neck of the left proximal humerus with
mild displacement.

## 2020-05-31 IMAGING — DX DG HUMERUS 2V *L*
3 series · 3 of 3 positions shown · non-contrast
Comparison: None.

CLINICAL DATA: Patient arrived from EMS and states she [place] a fall
at [place]. The patient fell onto her left shoulder and is
experiencing pain from the shoulder to the elbow. The patient's
vitals were wnl en route and a sling was applied by EMS.

EXAM:
LEFT HUMERUS - 2+ VIEW; LEFT SHOULDER - 2+ VIEW

[humerus ap]
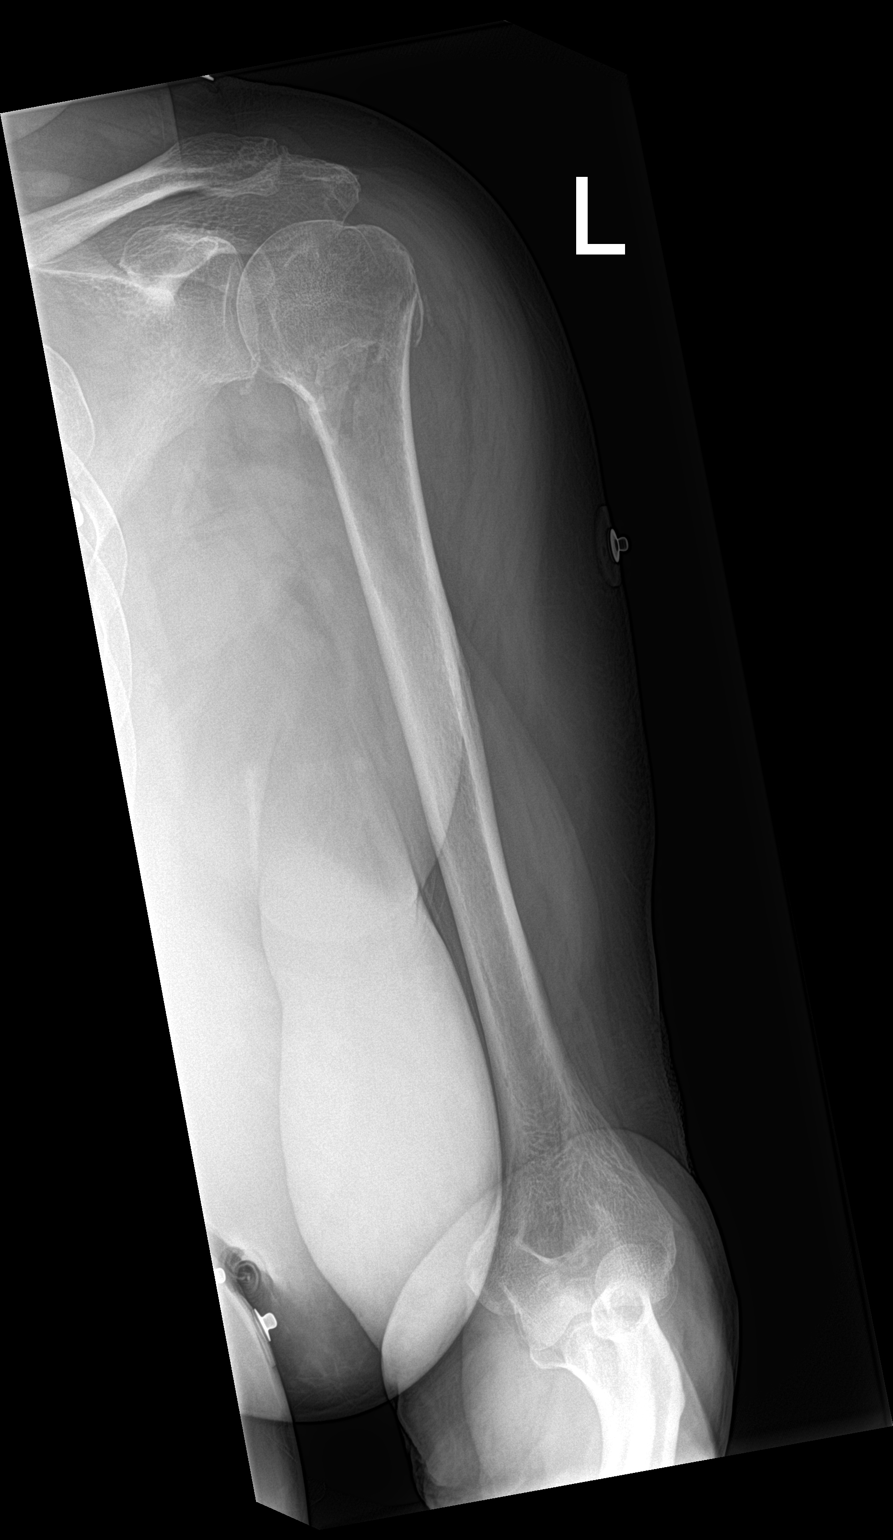

[humerus lat (1 of 2)]
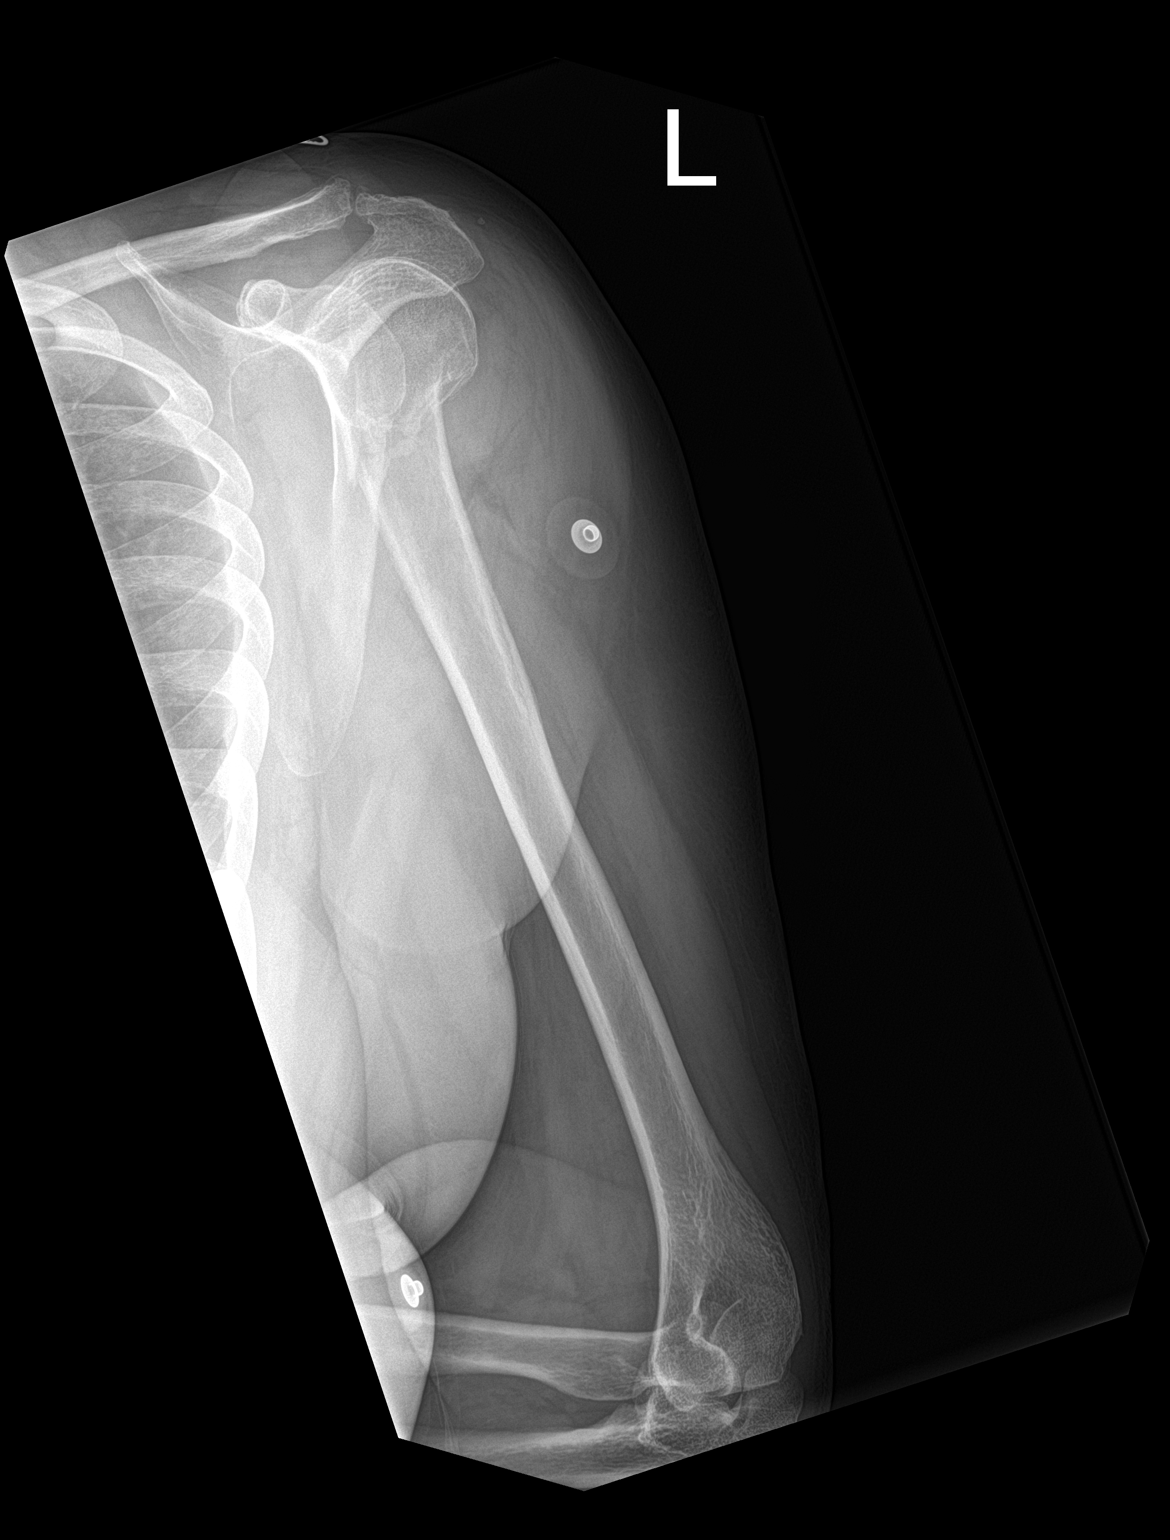

[humerus lat (2 of 2)]
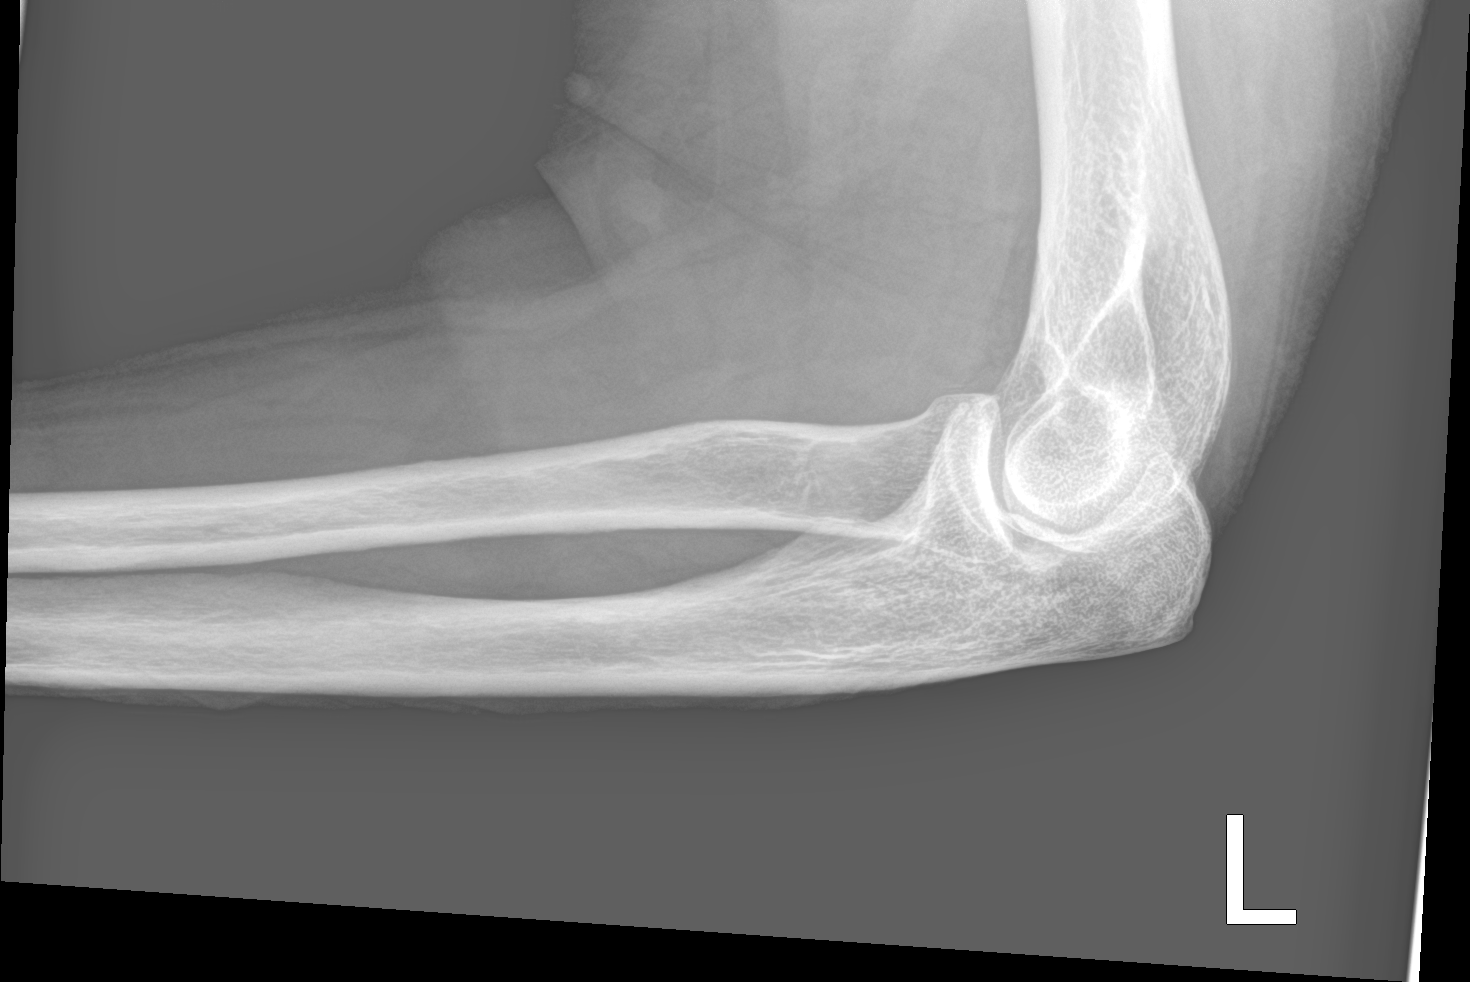

[3 of 3 positions shown; findings below may reference images not displayed]

FINDINGS: Left shoulder: There is an oblique fracture through the neck of the
left proximal humerus with mild displacement. No evidence of
dislocation. No acute finding in the adjacent left ribs or lung.
Regional soft tissues are unremarkable.

Left humerus: No additional acute abnormality in the remainder of
the left humerus. Normal articulation at the elbow joint.
IMPRESSION: Oblique fracture through the neck of the left proximal humerus with
mild displacement.

## 2020-06-01 NOTE — Telephone Encounter (Signed)
See lab notes

## 2020-06-03 ENCOUNTER — Other Ambulatory Visit: Payer: Self-pay | Admitting: Family Medicine

## 2020-06-09 ENCOUNTER — Ambulatory Visit (INDEPENDENT_AMBULATORY_CARE_PROVIDER_SITE_OTHER): Payer: 59 | Admitting: Bariatrics

## 2020-06-10 ENCOUNTER — Ambulatory Visit: Payer: 59

## 2020-06-12 ENCOUNTER — Ambulatory Visit (INDEPENDENT_AMBULATORY_CARE_PROVIDER_SITE_OTHER): Payer: 59 | Admitting: Bariatrics

## 2020-06-15 ENCOUNTER — Other Ambulatory Visit: Payer: Self-pay | Admitting: Cardiology

## 2020-06-18 ENCOUNTER — Ambulatory Visit: Payer: 59

## 2020-06-19 ENCOUNTER — Other Ambulatory Visit: Payer: Self-pay | Admitting: Family Medicine

## 2020-06-20 ENCOUNTER — Ambulatory Visit
Admission: RE | Admit: 2020-06-20 | Discharge: 2020-06-20 | Disposition: A | Discharge status: Home / Self Care | Payer: 59 | Source: Ambulatory Visit | Attending: Obstetrics & Gynecology | Admitting: Obstetrics & Gynecology

## 2020-06-20 ENCOUNTER — Other Ambulatory Visit: Payer: Self-pay

## 2020-06-20 DIAGNOSIS — Z1231 Encounter for screening mammogram for malignant neoplasm of breast: Secondary | ICD-10-CM

## 2020-06-23 ENCOUNTER — Other Ambulatory Visit: Payer: Self-pay | Admitting: Internal Medicine

## 2020-06-24 ENCOUNTER — Ambulatory Visit (INDEPENDENT_AMBULATORY_CARE_PROVIDER_SITE_OTHER): Payer: 59 | Admitting: Physician Assistant

## 2020-06-24 ENCOUNTER — Encounter: Payer: Self-pay | Admitting: Physician Assistant

## 2020-06-24 ENCOUNTER — Other Ambulatory Visit: Payer: Self-pay

## 2020-06-24 VITALS — BP 102/60 | HR 65 | Temp 97.8°F | Ht 66.0 in | Wt 227.2 lb

## 2020-06-24 DIAGNOSIS — R0981 Nasal congestion: Secondary | ICD-10-CM

## 2020-06-24 DIAGNOSIS — Z20822 Contact with and (suspected) exposure to covid-19: Secondary | ICD-10-CM

## 2020-06-24 MED ORDER — AMOXICILLIN-POT CLAVULANATE 875-125 MG PO TABS: 1.0000 | ORAL_TABLET | Freq: Two times a day (BID) | ORAL | 0 refills | Status: DC

## 2020-06-24 NOTE — Addendum Note (Signed)
Addended by: Marian Sorrow on: 06/24/2020 04:18 PM   Modules accepted: Orders

## 2020-06-24 NOTE — Progress Notes (Signed)
[name] is a 59 y.o. female here for a new problem.  I acted as a Education administrator for [employer], PA-C Anselmo Pickler, LPN   History of Present Illness:   Chief Complaint  Patient presents with  . Otalgia    HPI   Otalgia Pt c/o right ear pain x 3-4 days, tried some OTC ear drops and Tylenol. Pt also having facial pain and pressure. Denies fever, chills, drainage, loss of taste/smell, cough, nausea, poor PO intake.  She found out yesterday that someone in her church tested positive for COVID yesterday and she was around him a week ago Sunday.  Patient had COVID in December 2020 and is fully vaccinated.    Past Medical History:  Diagnosis Date  . Abnormal SPEP 04/17/2014  . Acute left ankle pain 01/26/2017  . ANEMIA-UNSPECIFIED 09/18/2009  . Anxiety   . Arthritis   . CHF (congestive heart failure) ([place])   . Chronic diastolic heart failure, NYHA class 2 ([facility])    Normal LVEDP by May 2018  . COPD (chronic obstructive pulmonary disease) ([place])   . Depression   . DIABETES MELLITUS, TYPE II 08/21/2006  . Diabetic osteomyelitis ([place]) 05/29/2015  . Fatty liver   . Fracture of 5th metatarsal    non union  . GERD 37/62/8315   Had fundoplication  . GOUT 08/20/2010  . Hammer toe of right foot    2-5th toes  . Hx of umbilical hernia repair   . HYPERLIPIDEMIA 08/21/2006  . HYPERTENSION 08/21/2006  . Infection of wound due to methicillin resistant Staphylococcus aureus (MRSA)   . Internal hemorrhoids   . Kidney problem   . Multiple allergies 10/14/2016  . OBESITY 06/04/2009  . Onychomycosis 10/27/2015  . Osteomyelitis of left foot ([place]) 05/29/2015  . Pulmonary sarcoidosis ([place])    Followed locally by pulmonology, but also by Dr. Casper Harrison at [facility]  . Right knee pain 01/26/2017  . SOB (shortness of breath)   . Vocal cord dysfunction   . Wears partial dentures      Social History   Tobacco Use  . Smoking status: Never Smoker  . Smokeless tobacco: Never Used   Vaping Use  . Vaping Use: Never used  Substance Use Topics  . Alcohol use: No    Alcohol/week: 0.0 standard drinks  . Drug use: No    Past Surgical History:  Procedure Laterality Date  . ABDOMINAL HYSTERECTOMY    . APPENDECTOMY    . BLADDER SUSPENSION  11/11/2011   Procedure: TRANSVAGINAL TAPE (TVT) PROCEDURE;  Surgeon: Olga Millers, MD;  Location: [facility];  Service: Gynecology;  Laterality: N/A;  . CAROTIDS  02/18/11   CAROTID DUPLEX; VERTEBRALS ARE PATENT WITH ANTEGRADE FLOW. ICA/CCA RATIO 1.61 ON RIGHT AND 0.75 ON LEFT  . CHOLECYSTECTOMY  1984  . CYSTOSCOPY  11/11/2011   Procedure: CYSTOSCOPY;  Surgeon: Olga Millers, MD;  Location: [facility];  Service: Gynecology;  Laterality: N/A;  . EXTUBATION (ENDOTRACHEAL) IN OR N/A 09/23/2017   Procedure: EXTUBATION (ENDOTRACHEAL) IN OR;  Surgeon: Helayne Seminole, MD;  Location: [facility];  Service: ENT;  Laterality: N/A;  . FIBEROPTIC LARYNGOSCOPY AND TRACHEOSCOPY N/A 09/23/2017   Procedure: FLEXIBLE FIBEROPTIC LARYNGOSCOPY;  Surgeon: Helayne Seminole, MD;  Location: [facility];  Service: ENT;  Laterality: N/A;  . FRACTURE SURGERY     foot  . HALLUX FUSION Left 10/02/2018   Procedure: HALLUX ARTHRODESIS;  Surgeon: [place]rick Kins, DPM;  Location: [facility];  Service:  Podiatry;  Laterality: Left;  . HAMMER TOE SURGERY Left 10/02/2018   Procedure: HAMMER TOE CORRECTION 2ND 3RD 4RD FIFTH TOE;  Surgeon: [place] Kins, DPM;  Location: [facility];  Service: Podiatry;  Laterality: Left;  . HAMMER TOE SURGERY Right 04/12/2019   Procedure: HAMMER TOE CORRECTION, 2ND, 3RD, 4TH AND 5TH TOES OF RIGHT FOOT;  Surgeon: [place] Kins, DPM;  Location: [facility];  Service: Podiatry;  Laterality: Right;  . HAMMER TOE SURGERY Left 10/25/2019   Procedure: HAMMER TOE REPAIR SECOND, THIRD, FOUTH;  Surgeon: [place] Kins, DPM;  Location: [facility];  Service: Podiatry;  Laterality: Left;  . HERNIA REPAIR    . I & D EXTREMITY Left 06/27/2015   Procedure: Partial Excision Left  Calcaneus, Place Antibiotic Beads, and Wound VAC;  Surgeon: Newt Minion, MD;  Location: [place];  Service: Orthopedics;  Laterality: Left;  . JOINT REPLACEMENT    . KNEE ARTHROSCOPY     right  . LEFT AND RIGHT HEART CATHETERIZATION WITH CORONARY ANGIOGRAM N/A 04/23/2013   Procedure: LEFT AND RIGHT HEART CATHETERIZATION WITH CORONARY ANGIOGRAM;  Surgeon: Leonie Man, MD;  Location: [facility];  Service: Cardiovascular;  Laterality: N/A;  . LEFT HEART CATH AND CORONARY ANGIOGRAPHY N/A 03/11/2017   Procedure: Left Heart Cath and Coronary Angiography;  Surgeon: Sherren Mocha, MD;  Location: [name]; angiographically minimal CAD in the LAD otherwise normal.;  Normal LVEDP.  FALSE POSITIVE MYOVIEW  . LEFT HEART CATH AND CORONARY ANGIOGRAPHY  07/20/2010   LVEF 50-55% WITH VERY MILD GLOBAL HYPOKINESIA; ESSENTIALLY NORMAL CORONARY ARTERIES; NORMAL LV FUNCTION  . METATARSAL OSTEOTOMY WITH OPEN REDUCTION INTERNAL FIXATION (ORIF) METATARSAL WITH FUSION Left 04/09/2014   Procedure: LEFT FOOT FRACTURE OPEN TREATMENT METATARSAL INCLUDES INTERNAL FIXATION EACH;  Surgeon: Lorn Junes, MD;  Location: [facility];  Service: Orthopedics;  Laterality: Left;  . NISSEN FUNDOPLICATION  8413  . NM MYOVIEW LTD  03/09/2013   Lexiscan --> EF 50%; NORMAL MYOCARDIAL PERFUSION STUDY - breast attenuation  . NM MYOVIEW LTD  03/10/2017   : Moderate size "stress-induced "perfusion defect at the apex as well as "ill-defined stress-induced perfusion defect in the lateral wall.  EF 55%.  INTERMEDIATE risk. -->  FALSE POSITIVE  . Right and left CARDIAC CATHETERIZATION  04/23/2013   Angiographic normal coronaries; LVEDP 20 mmHg, PCWP 12-14 mmHg, RAP 12 mmHg.; Fick CO/CI 4.9/2.2  . SHOULDER ARTHROSCOPY Left 03/14/2019   Procedure: LEFT SHOULDER ARTHROSCOPY, DEBRIDEMENT, AND DECOMPRESSION;  Surgeon: Newt Minion, MD;  Location: [facility];  Service: Orthopedics;  Laterality: Left;  . TOTAL KNEE ARTHROPLASTY  Right 06/29/2017   Procedure: RIGHT TOTAL KNEE ARTHROPLASTY;  Surgeon: Newt Minion, MD;  Location: [facility];  Service: Orthopedics;  Laterality: Right;  . TOTAL KNEE ARTHROPLASTY Left 12/07/2017  . TOTAL KNEE ARTHROPLASTY Left 12/07/2017   Procedure: LEFT TOTAL KNEE ARTHROPLASTY;  Surgeon: Newt Minion, MD;  Location: [facility];  Service: Orthopedics;  Laterality: Left;  . TRACHEOSTOMY TUBE PLACEMENT N/A 09/20/2017   Procedure: AWAKE INTUBATION WITH ANESTHESIA WITH VIDEO ASSISTANCE;  Surgeon: Helayne Seminole, MD;  Location: [facility];  Service: ENT;  Laterality: N/A;  . TRANSTHORACIC ECHOCARDIOGRAM  08/2014   Normal LV size and function.  Mild LVH.  EF 55-60%.  Normal regional wall motion.  GR 1 DD.  Normal RV size and function .  [name] TUBAL LIGATION     with reversal in 1994  . VENTRAL HERNIA REPAIR  Family History  Problem Relation Age of Onset  . Diabetes Father   . Heart attack Father 37  . Coronary artery disease Father   . Heart failure Father   . Throat cancer Father   . Hypertension Father   . Heart disease Father   . Sleep apnea Father   . Obesity Father   . COPD Mother   . Emphysema Mother   . Asthma Mother   . Heart failure Mother   . Breast cancer Mother   . Diabetes Mother   . Kidney disease Mother   . Thyroid disease Mother   . Heart attack Maternal Grandfather   . Sarcoidosis Maternal Uncle   . Lung cancer Brother   . Diabetes Brother   . Colon cancer Neg Hx   . Rectal cancer Neg Hx     Allergies  Allergen Reactions  . Methotrexate Other (See Comments)    Peri-oral and buccal lesions  . Vancomycin Other (See Comments)    DOSE RELATED NEPHROTOXICITY  . Lisinopril Cough  . Chlorhexidine Itching  . Clindamycin/Lincomycin Nausea And Vomiting and Rash  . Doxycycline Rash  . Teflaro [Ceftaroline] Rash and Other (See Comments)    Tolerates ceftriaxone     Current Medications:   Current Outpatient Medications:  .  albuterol (PROVENTIL HFA;VENTOLIN HFA)  108 (90 Base) MCG/ACT inhaler, Inhale 1-2 puffs into the lungs every 6 (six) hours as needed for wheezing or shortness of breath., Disp: 8 g, Rfl: 5 .  allopurinol (ZYLOPRIM) 100 MG tablet, Take 100 mg by mouth every evening. , Disp: , Rfl:  .  aspirin EC 81 MG tablet, Take 81 mg by mouth daily. , Disp: , Rfl:  .  atorvastatin (LIPITOR) 40 MG tablet, TAKE 1 TABLET BY MOUTH EVERY DAY, Disp: 90 tablet, Rfl: 3 .  BREO ELLIPTA 200-25 MCG/INH AEPB, TAKE 1 PUFF BY MOUTH EVERY DAY, Disp: 60 each, Rfl: 5 .  buPROPion (WELLBUTRIN XL) 300 MG 24 hr tablet, TAKE 1 TABLET BY MOUTH EVERY DAY, Disp: 90 tablet, Rfl: 3 .  carvedilol (COREG) 25 MG tablet, TAKE 1 TABLET (25 MG TOTAL) BY MOUTH 2 (TWO) TIMES DAILY WITH A MEAL., Disp: 180 tablet, Rfl: 3 .  chlorpheniramine-HYDROcodone (TUSSIONEX PENNKINETIC ER) 10-8 MG/5ML SUER, Take 5 mLs by mouth 2 (two) times daily. (Patient taking differently: Take 5 mLs by mouth 2 (two) times daily as needed. ), Disp: 140 mL, Rfl: 0 .  dexlansoprazole (DEXILANT) 60 MG capsule, Take 1 capsule (60 mg total) by mouth daily., Disp: 30 capsule, Rfl: 5 .  diclofenac Sodium (VOLTAREN) 1 % GEL, Apply 1 application topically 4 (four) times daily as needed (pain.)., Disp: , Rfl:  .  diltiazem (CARDIZEM CD) 120 MG 24 hr capsule, TAKE 1 CAPSULE BY MOUTH EVERY DAY, Disp: 90 capsule, Rfl: 0 .  Dulaglutide (TRULICITY) 3 QB/3.4LP SOPN, Inject 0.5 mLs (3 mg total) into the skin once a week., Disp: 12 pen, Rfl: 3 .  famotidine (PEPCID) 20 MG tablet, Take 1 tablet (20 mg total) by mouth at bedtime., Disp: 30 tablet, Rfl: 3 .  fenofibrate 160 MG tablet, TAKE 1 TABLET BY MOUTH EVERY DAY, Disp: 90 tablet, Rfl: 1 .  fluticasone (FLONASE) 50 MCG/ACT nasal spray, Place 2 sprays into both nostrils daily as needed for allergies., Disp: 48 mL, Rfl: 3 .  furosemide (LASIX) 40 MG tablet, TAKE 1 TABLET BY MOUTH TWICE A DAY, Disp: 180 tablet, Rfl: 1 .  gabapentin (NEURONTIN) 100 MG capsule, TAKE 1  CAPSULE BY MOUTH  THREE TIMES A DAY, Disp: 270 capsule, Rfl: 3 .  glucose blood (CONTOUR NEXT TEST) test strip, 1 each by Other route 4 (four) times daily. And lancets 4/day, Disp: 400 each, Rfl: 3 .  icosapent Ethyl (VASCEPA) 1 g capsule, Take 2 capsules (2 g total) by mouth 2 (two) times daily., Disp: 120 capsule, Rfl: 11 .  insulin glargine (LANTUS SOLOSTAR) 100 UNIT/ML Solostar Pen, Inject 30 Units into the skin every morning., Disp: 18 mL, Rfl: 2 .  insulin lispro (HUMALOG KWIKPEN) 100 UNIT/ML KwikPen, Inject 0.15 mLs (15 Units total) into the skin daily with supper., Disp: 15 mL, Rfl: 29 .  Insulin Pen Needle (PEN NEEDLES) 32G X 4 MM MISC, 1 each by Does not apply route 4 (four) times daily. E11.9, Disp: 400 each, Rfl: 0 .  isosorbide mononitrate (IMDUR) 30 MG 24 hr tablet, TAKE 1 TABLET (30 MG TOTAL) BY MOUTH AT BEDTIME., Disp: 90 tablet, Rfl: 1 .  KLOR-CON M20 20 MEQ tablet, TAKE 1 & 1/2 TABLETS BY MOUTH TWICE A DAY, Disp: 270 tablet, Rfl: 2 .  lactulose (CHRONULAC) 10 GM/15ML solution, TAKE 15 MLS (10 G TOTAL) BY MOUTH DAILY AS NEEDED (CONSTIPATION)., Disp: 236 mL, Rfl: 2 .  LINZESS 290 MCG CAPS capsule, TAKE 1 CAPSULE (290 MCG TOTAL) BY MOUTH DAILY BEFORE BREAKFAST., Disp: 30 capsule, Rfl: 0 .  LORazepam (ATIVAN) 0.5 MG tablet, Take 0.5 mg by mouth every 12 (twelve) hours as needed for anxiety., Disp: , Rfl:  .  metFORMIN (GLUCOPHAGE) 1000 MG tablet, TAKE 1 TABLET BY MOUTH 2 TIMES DAILY WITH A MEAL. MUST CALL TO SCHEDULE AN APPT., Disp: 180 tablet, Rfl: 1 .  Microlet Lancets MISC, 1 each by Does not apply route 2 (two) times daily. E11.9, Disp: 100 each, Rfl: 2 .  nitroGLYCERIN (NITROSTAT) 0.4 MG SL tablet, Place 1 tablet (0.4 mg total) under the tongue every 5 (five) minutes as needed for chest pain. Reported on 01/05/2016, Disp: 25 tablet, Rfl: 6 .  ondansetron (ZOFRAN-ODT) 4 MG disintegrating tablet, Take 1 tablet (4 mg total) by mouth every 8 (eight) hours as needed for nausea or vomiting., Disp: 50 tablet,  Rfl: 2 .  predniSONE (DELTASONE) 5 MG tablet, Take 5 mg by mouth daily with breakfast., Disp: , Rfl:  .  telmisartan (MICARDIS) 20 MG tablet, TAKE 1 TABLET BY MOUTH EVERY DAY, Disp: 90 tablet, Rfl: 1 .  venlafaxine XR (EFFEXOR-XR) 75 MG 24 hr capsule, TAKE 1 CAPSULE BY MOUTH TWICE A DAY, Disp: 180 capsule, Rfl: 3 .  Vitamin D, Ergocalciferol, (DRISDOL) 1.25 MG (50000 UNIT) CAPS capsule, Take 1 capsule (50,000 Units total) by mouth every 7 (seven) days., Disp: 4 capsule, Rfl: 0 .  amoxicillin-clavulanate (AUGMENTIN) 875-125 MG tablet, Take 1 tablet by mouth 2 (two) times daily., Disp: 20 tablet, Rfl: 0 .  Colchicine 0.6 MG CAPS, Take 0.6 mg by mouth 2 (two) times daily as needed for up to 4 days (may discontinue if symptoms improve)., Disp: 20 capsule, Rfl: 1   Review of Systems:   ROS  Negative unless otherwise specified per HPI.  Vitals:   Vitals:   06/24/20 0733  BP: 102/60  Pulse: 65  Temp: 97.8 F (36.6 C)  TempSrc: Temporal  SpO2: 98%  Weight: 227 lb 4 oz (103.1 kg)  Height: 5\' 6"  (1.676 m)     Body mass index is 36.68 kg/m.  Physical Exam:   Physical Exam Vitals and nursing note reviewed.  Constitutional:  General: She is not in acute distress.    Appearance: She is well-developed. She is not ill-appearing or toxic-appearing.  HENT:     Right Ear: Swelling (R tragus and outer canal) and tenderness present.     Left Ear: Tympanic membrane and ear canal normal. Tympanic membrane is not injected, scarred, perforated or erythematous.  Cardiovascular:     Rate and Rhythm: Normal rate and regular rhythm.     Pulses: Normal pulses.     Heart sounds: Normal heart sounds, S1 normal and S2 normal.     Comments: No LE edema Pulmonary:     Effort: Pulmonary effort is normal.     Breath sounds: Normal breath sounds.  Skin:    General: Skin is warm and dry.  Neurological:     Mental Status: She is alert.     GCS: GCS eye subscore is 4. GCS verbal subscore is 5. GCS  motor subscore is 6.  Psychiatric:        Speech: Speech normal.        Behavior: Behavior normal. Behavior is cooperative.      Assessment and Plan:   Golden was seen today for otalgia.  Diagnoses and all orders for this visit:  Sinus congestion Will start oral augmentin to cover for sinusitis. Follow-up if symptoms worsen or persist despite treatment. -     Novel Coronavirus, NAA ([facility])  Close exposure to COVID-19 virus COVID test ordered today. Low likelihood and thankfully vaccinated. Quarantine until results have returned. Worsening precautions advised. If positive, recommend monoclonal ab infusion. -     Novel Coronavirus, NAA ([facility])  Other orders -     amoxicillin-clavulanate (AUGMENTIN) 875-125 MG tablet; Take 1 tablet by mouth 2 (two) times daily.  CMA or LPN served as scribe during this visit. History, Physical, and Plan performed by medical provider. The above documentation has been reviewed and is accurate and complete.  Inda Coke, PA-C

## 2020-06-24 NOTE — Patient Instructions (Signed)
It was great to see you!  Start using flonase for your nasal  Augmentin for sinus infection and ear pain.  I will be in touch with your COVID results. Please quarantine until the results have returned.  Keep me posted on how you are doing in the meantime.  Take care,  Inda Coke PA-C

## 2020-06-26 ENCOUNTER — Ambulatory Visit: Payer: 59

## 2020-06-26 LAB — SARS-COV-2, NAA 2 DAY TAT

## 2020-06-26 LAB — NOVEL CORONAVIRUS, NAA: SARS-CoV-2, NAA: NOT DETECTED

## 2020-07-01 ENCOUNTER — Other Ambulatory Visit: Payer: Self-pay | Admitting: Family Medicine

## 2020-07-02 ENCOUNTER — Other Ambulatory Visit: Payer: Self-pay | Admitting: Family Medicine

## 2020-07-03 ENCOUNTER — Encounter: Payer: Self-pay | Admitting: Family Medicine

## 2020-07-03 ENCOUNTER — Ambulatory Visit (INDEPENDENT_AMBULATORY_CARE_PROVIDER_SITE_OTHER): Payer: 59 | Admitting: Family Medicine

## 2020-07-03 ENCOUNTER — Other Ambulatory Visit: Payer: Self-pay

## 2020-07-03 ENCOUNTER — Ambulatory Visit: Payer: 59

## 2020-07-03 VITALS — BP 120/68 | HR 78 | Ht 66.0 in | Wt 233.6 lb

## 2020-07-03 DIAGNOSIS — M7061 Trochanteric bursitis, right hip: Secondary | ICD-10-CM

## 2020-07-03 DIAGNOSIS — S39012A Strain of muscle, fascia and tendon of lower back, initial encounter: Secondary | ICD-10-CM | POA: Diagnosis not present

## 2020-07-03 MED ORDER — TIZANIDINE HCL 4 MG PO TABS: 2.0000 mg | ORAL_TABLET | Freq: Three times a day (TID) | ORAL | 1 refills | Status: DC | PRN

## 2020-07-03 NOTE — Progress Notes (Signed)
    Subjective:    CC: Low back pain  I, Molly Weber, LAT, ATC, am serving as scribe for Dr. [name].  HPI: [name] a 59 y/o female presenting w/ c/o R-sided low back pain x 2 weeks.  Pain also occurs in the lateral hip and into the lateral thigh.  She denies pain radiating below the level of the knee.  No injury or central back pain.  Radiating pain: yes into the R lateral thigh LE numbness/tingling: No LE weakness: No Aggravating factors: prolonged sitting or standing; attempting to roll over in bed Treatments tried: Tylenol; heat; topical pain relieving cream  Pertinent review of Systems: No fevers or chills  Relevant historical information: Sarcoidosis, diabetes, bilateral knee replacement   Objective:    Vitals:   07/03/20 1052  BP: 120/68  Pulse: 78  SpO2: 96%   General: Well Developed, well nourished, and in no acute distress.   MSK: L-spine normal-appearing Nontender midline. Tender palpation right lumbar paraspinal musculature. Decreased lumbar motion to rotation and lateral flexion to the right. Lower extremity strength intact distally except noted below. Reflexes and sensation are intact distally.  Right hip normal-appearing normal motion.  Tender palpation greater trochanter.  Hip abduction strength 4+/5.  External rotation strength 4/5.  Lab and Radiology Results X-ray images of L-spine and right hip obtained during CT scan abdomen and pelvis Feb 11, 2020 personally and independently interpreted Mild L-spine DJD and facet DJD worse at L5-S1. Mild right hip DJD changes. No acute fractures..    Impression and Recommendations:    Assessment and Plan: 59 y.o. female with right low back pain and right lateral hip pain due to myofascial spasm and dysfunction and trochanteric bursitis/hip abductor tendinopathy.  Plan for trial of physical therapy.  Will use heating pad TENS unit and tizanidine as needed.  Recommend caution with tizanidine. Recheck in 6  weeks or so especially if not improved.  PDMP not reviewed this encounter. Orders Placed This Encounter  Procedures  . Ambulatory referral to Physical Therapy    Referral Priority:   Routine    Referral Type:   Physical Medicine    Referral Reason:   Specialty Services Required    Requested Specialty:   Physical Therapy   Meds ordered this encounter  Medications  . tiZANidine (ZANAFLEX) 4 MG tablet    Sig: Take 0.5-1 tablets (2-4 mg total) by mouth every 8 (eight) hours as needed for muscle spasms.    Dispense:  30 tablet    Refill:  1    Discussed warning signs or symptoms. Please see discharge instructions. Patient expresses understanding.   The above documentation has been reviewed and is accurate and complete [name], M.D.

## 2020-07-03 NOTE — Patient Instructions (Addendum)
Thank you for coming in today.  I've referred you to Physical Therapy.  Let us know if you don't hear from them in one week.  Try the muscle relaxer is needed.   Let me know if you do not improve with about a month of PT>    TENS UNIT: This is helpful for muscle pain and spasm.   Search and Purchase a TENS 7000 2nd edition at  www.tenspros.com or www.[place].com It should be less than $30.     TENS unit instructions: Do not shower or bathe with the unit on . Turn the unit off before removing electrodes or batteries . If the electrodes lose stickiness add a drop of water to the electrodes after they are disconnected from the unit and place on plastic sheet. If you continued to have difficulty, call the TENS unit company to purchase more electrodes. . Do not apply lotion on the skin area prior to use. Make sure the skin is clean and dry as this will help prolong the life of the electrodes. . After use, always check skin for unusual red areas, rash or other skin difficulties. If there are any skin problems, does not apply electrodes to the same area. . Never remove the electrodes from the unit by pulling the wires. . Do not use the TENS unit or electrodes other than as directed. . Do not change electrode placement without consultating your therapist or physician. [name] Keep 2 fingers with between each electrode. . Wear time ratio is 2:1, on to off times.    For example on for 30 minutes off for 15 minutes and then on for 30 minutes off for 15 minutes

## 2020-07-06 ENCOUNTER — Encounter: Payer: Self-pay | Admitting: Family Medicine

## 2020-07-07 ENCOUNTER — Emergency Department (HOSPITAL_BASED_OUTPATIENT_CLINIC_OR_DEPARTMENT_OTHER)
Admission: EM | Admit: 2020-07-07 | Discharge: 2020-07-07 | Disposition: A | Discharge status: Home / Self Care | Payer: 59 | Attending: Emergency Medicine | Admitting: Emergency Medicine

## 2020-07-07 ENCOUNTER — Encounter (HOSPITAL_BASED_OUTPATIENT_CLINIC_OR_DEPARTMENT_OTHER): Payer: Self-pay | Admitting: *Deleted

## 2020-07-07 ENCOUNTER — Other Ambulatory Visit: Payer: 59

## 2020-07-07 ENCOUNTER — Other Ambulatory Visit: Payer: Self-pay

## 2020-07-07 ENCOUNTER — Emergency Department (HOSPITAL_BASED_OUTPATIENT_CLINIC_OR_DEPARTMENT_OTHER): Payer: 59

## 2020-07-07 DIAGNOSIS — E119 Type 2 diabetes mellitus without complications: Secondary | ICD-10-CM | POA: Diagnosis not present

## 2020-07-07 DIAGNOSIS — N183 Chronic kidney disease, stage 3 unspecified: Secondary | ICD-10-CM | POA: Diagnosis not present

## 2020-07-07 DIAGNOSIS — Z96652 Presence of left artificial knee joint: Secondary | ICD-10-CM | POA: Insufficient documentation

## 2020-07-07 DIAGNOSIS — Z794 Long term (current) use of insulin: Secondary | ICD-10-CM | POA: Insufficient documentation

## 2020-07-07 DIAGNOSIS — R05 Cough: Secondary | ICD-10-CM | POA: Diagnosis present

## 2020-07-07 DIAGNOSIS — J449 Chronic obstructive pulmonary disease, unspecified: Secondary | ICD-10-CM | POA: Diagnosis not present

## 2020-07-07 DIAGNOSIS — Z96651 Presence of right artificial knee joint: Secondary | ICD-10-CM | POA: Diagnosis not present

## 2020-07-07 DIAGNOSIS — Z20822 Contact with and (suspected) exposure to covid-19: Secondary | ICD-10-CM

## 2020-07-07 DIAGNOSIS — J069 Acute upper respiratory infection, unspecified: Secondary | ICD-10-CM | POA: Insufficient documentation

## 2020-07-07 DIAGNOSIS — I130 Hypertensive heart and chronic kidney disease with heart failure and stage 1 through stage 4 chronic kidney disease, or unspecified chronic kidney disease: Secondary | ICD-10-CM | POA: Insufficient documentation

## 2020-07-07 DIAGNOSIS — I5032 Chronic diastolic (congestive) heart failure: Secondary | ICD-10-CM | POA: Diagnosis not present

## 2020-07-07 DIAGNOSIS — Z79899 Other long term (current) drug therapy: Secondary | ICD-10-CM | POA: Diagnosis not present

## 2020-07-07 LAB — RESPIRATORY PANEL BY RT PCR (FLU A&B, COVID)
Influenza A by PCR: NEGATIVE
Influenza B by PCR: NEGATIVE
SARS Coronavirus 2 by RT PCR: NEGATIVE

## 2020-07-07 NOTE — ED Provider Notes (Signed)
[facility] EMERGENCY DEPARTMENT Provider Note   CSN: [id number] Arrival date & time: 07/07/20  1841     History Chief Complaint  Patient presents with  . Cough    [name] is a 59 y.o. female presenting for evaluation of cough, hoarseness, nasal congestion.  Patient states yesterday she had a Covid exposure.  Today, she started develop symptoms including nasal congestion, hoarseness, and cough.  She reports intermittent mild shortness of breath with exertion, no shortness of breath at rest.  She denies fevers, chills, chest pain, nausea, vomiting abdominal pain.  She has a history of sarcoid for which she is taking prednisone.  She reports multiple other medical problems.  She is fully vaccinated.  HPI     Past Medical History:  Diagnosis Date  . Abnormal SPEP 04/17/2014  . Acute left ankle pain 01/26/2017  . ANEMIA-UNSPECIFIED 09/18/2009  . Anxiety   . Arthritis   . CHF (congestive heart failure) ([place])   . Chronic diastolic heart failure, NYHA class 2 (HCC)    Normal LVEDP by May 2018  . COPD (chronic obstructive pulmonary disease) ([place])   . Depression   . DIABETES MELLITUS, TYPE II 08/21/2006  . Diabetic osteomyelitis ([place]) 05/29/2015  . Fatty liver   . Fracture of 5th metatarsal    non union  . GERD 12/87/8676   Had fundoplication  . GOUT 08/20/2010  . Hammer toe of right foot    2-5th toes  . Hx of umbilical hernia repair   . HYPERLIPIDEMIA 08/21/2006  . HYPERTENSION 08/21/2006  . Infection of wound due to methicillin resistant Staphylococcus aureus (MRSA)   . Internal hemorrhoids   . Kidney problem   . Multiple allergies 10/14/2016  . OBESITY 06/04/2009  . Onychomycosis 10/27/2015  . Osteomyelitis of left foot ([place]) 05/29/2015  . Pulmonary sarcoidosis ([place])    Followed locally by pulmonology, but also by Dr. Casper Harrison at [facility]  . Right knee pain 01/26/2017  . SOB (shortness of breath)   . Vocal cord dysfunction   . Wears partial  dentures     Patient Active Problem List   Diagnosis Date Noted  . Hypertriglyceridemia 04/02/2020  . Aortic atherosclerosis ([place]) 02/14/2020  . Nontraumatic complete tear of left rotator cuff   . Diabetic neuropathy ([place]) 02/20/2019  . Severe persistent asthma 01/04/2019  . Subcortical microvascular ischemic occlusive disease 07/12/2018  . Rapid palpitations 07/12/2018  . Impingement of left ankle joint 06/26/2018  . Constipation 04/21/2018  . Total knee replacement status, left 12/07/2017  . Dysphagia 09/20/2017  . Epiglottitis   . Plantar fasciitis, right 07/13/2017  . S/P total knee arthroplasty, right 06/29/2017  . Osteoarthrosis, localized, primary, knee, right   . Sprain of calcaneofibular ligament of right ankle 06/06/2017  . Osteoarthritis of right knee 01/26/2017  . Onychomycosis 10/27/2015  . CKD (chronic kidney disease) stage 3, GFR 30-59 ml/min 04/27/2015  . MRSA (methicillin resistant staph aureus) culture positive 03/27/2015  . History of osteomyelitis 03/27/2015  . Fatty liver 09/30/2014  . Hot flashes 07/15/2014  . Abnormal SPEP 04/17/2014  . Fracture of left leg 04/17/2014  . Cushingoid side effect of steroids ([place]) 04/17/2014  . Internal hemorrhoids   . Major depression in full remission ([place])   . Preoperative clearance 03/25/2014  . Obstructive chronic bronchitis without exacerbation ([place]) 09/18/2013  . Chest pain 04/11/2013  . Hypertensive heart disease with chronic diastolic congestive heart failure ([place])   . Solitary pulmonary nodule, on CT 02/2013 -  stable over 2 years in 2015 02/20/2013  . Gout 08/20/2010  . Anemia 09/18/2009  . Morbid obesity ([place]) 06/04/2009  . Sleep apnea 04/21/2009  . Sarcoidosis of lung ([employer]) 04/10/2007  . Hyperlipidemia associated with type 2 diabetes mellitus ([place]) 08/21/2006  . Hypertension associated with diabetes ([place]) 08/21/2006  . GERD 08/21/2006  . Type II diabetes mellitus with renal manifestations ([place]) 08/21/2006      Past Surgical History:  Procedure Laterality Date  . ABDOMINAL HYSTERECTOMY    . APPENDECTOMY    . BLADDER SUSPENSION  11/11/2011   Procedure: TRANSVAGINAL TAPE (TVT) PROCEDURE;  Surgeon: Olga Millers, MD;  Location: [facility];  Service: Gynecology;  Laterality: N/A;  . CAROTIDS  02/18/11   CAROTID DUPLEX; VERTEBRALS ARE PATENT WITH ANTEGRADE FLOW. ICA/CCA RATIO 1.61 ON RIGHT AND 0.75 ON LEFT  . CHOLECYSTECTOMY  1984  . CYSTOSCOPY  11/11/2011   Procedure: CYSTOSCOPY;  Surgeon: Olga Millers, MD;  Location: [facility];  Service: Gynecology;  Laterality: N/A;  . EXTUBATION (ENDOTRACHEAL) IN OR N/A 09/23/2017   Procedure: EXTUBATION (ENDOTRACHEAL) IN OR;  Surgeon: Helayne Seminole, MD;  Location: [facility];  Service: ENT;  Laterality: N/A;  . FIBEROPTIC LARYNGOSCOPY AND TRACHEOSCOPY N/A 09/23/2017   Procedure: FLEXIBLE FIBEROPTIC LARYNGOSCOPY;  Surgeon: Helayne Seminole, MD;  Location: [facility];  Service: ENT;  Laterality: N/A;  . FRACTURE SURGERY     foot  . HALLUX FUSION Left 10/02/2018   Procedure: HALLUX ARTHRODESIS;  Surgeon: Edrick Kins, DPM;  Location: [facility];  Service: Podiatry;  Laterality: Left;  . HAMMER TOE SURGERY Left 10/02/2018   Procedure: HAMMER TOE CORRECTION 2ND 3RD 4RD FIFTH TOE;  Surgeon: Edrick Kins, DPM;  Location: [facility];  Service: Podiatry;  Laterality: Left;  . HAMMER TOE SURGERY Right 04/12/2019   Procedure: HAMMER TOE CORRECTION, 2ND, 3RD, 4TH AND 5TH TOES OF RIGHT FOOT;  Surgeon: Edrick Kins, DPM;  Location: [facility];  Service: Podiatry;  Laterality: Right;  . HAMMER TOE SURGERY Left 10/25/2019   Procedure: HAMMER TOE REPAIR SECOND, THIRD, FOUTH;  Surgeon: Edrick Kins, DPM;  Location: [facility];  Service: Podiatry;  Laterality: Left;  . HERNIA REPAIR    . I & D EXTREMITY Left 06/27/2015   Procedure: Partial Excision Left Calcaneus, Place Antibiotic Beads, and Wound VAC;  Surgeon: Newt Minion, MD;  Location: [facility];  Service: Orthopedics;  Laterality: Left;  .  JOINT REPLACEMENT    . KNEE ARTHROSCOPY     right  . LEFT AND RIGHT HEART CATHETERIZATION WITH CORONARY ANGIOGRAM N/A 04/23/2013   Procedure: LEFT AND RIGHT HEART CATHETERIZATION WITH CORONARY ANGIOGRAM;  Surgeon: Leonie Man, MD;  Location: [facility];  Service: Cardiovascular;  Laterality: N/A;  . LEFT HEART CATH AND CORONARY ANGIOGRAPHY N/A 03/11/2017   Procedure: Left Heart Cath and Coronary Angiography;  Surgeon: Sherren Mocha, MD;  Location: [facility] CV LAB; angiographically minimal CAD in the LAD otherwise normal.;  Normal LVEDP.  FALSE POSITIVE MYOVIEW  . LEFT HEART CATH AND CORONARY ANGIOGRAPHY  07/20/2010   LVEF 50-55% WITH VERY MILD GLOBAL HYPOKINESIA; ESSENTIALLY NORMAL CORONARY ARTERIES; NORMAL LV FUNCTION  . METATARSAL OSTEOTOMY WITH OPEN REDUCTION INTERNAL FIXATION (ORIF) METATARSAL WITH FUSION Left 04/09/2014   Procedure: LEFT FOOT FRACTURE OPEN TREATMENT METATARSAL INCLUDES INTERNAL FIXATION EACH;  Surgeon: Lorn Junes, MD;  Location: [facility];  Service: Orthopedics;  Laterality: Left;  . NISSEN FUNDOPLICATION  1749  . NM MYOVIEW  LTD  03/09/2013   Lexiscan --> EF 50%; NORMAL MYOCARDIAL PERFUSION STUDY - breast attenuation  . NM MYOVIEW LTD  03/10/2017   : Moderate size "stress-induced "perfusion defect at the apex as well as "ill-defined stress-induced perfusion defect in the lateral wall.  EF 55%.  INTERMEDIATE risk. -->  FALSE POSITIVE  . Right and left CARDIAC CATHETERIZATION  04/23/2013   Angiographic normal coronaries; LVEDP 20 mmHg, PCWP 12-14 mmHg, RAP 12 mmHg.; Fick CO/CI 4.9/2.2  . SHOULDER ARTHROSCOPY Left 03/14/2019   Procedure: LEFT SHOULDER ARTHROSCOPY, DEBRIDEMENT, AND DECOMPRESSION;  Surgeon: Newt Minion, MD;  Location: [facility];  Service: Orthopedics;  Laterality: Left;  . TOTAL KNEE ARTHROPLASTY Right 06/29/2017   Procedure: RIGHT TOTAL KNEE ARTHROPLASTY;  Surgeon: Newt Minion, MD;  Location: [facility];  Service: Orthopedics;  Laterality:  Right;  . TOTAL KNEE ARTHROPLASTY Left 12/07/2017  . TOTAL KNEE ARTHROPLASTY Left 12/07/2017   Procedure: LEFT TOTAL KNEE ARTHROPLASTY;  Surgeon: Newt Minion, MD;  Location: [facility];  Service: Orthopedics;  Laterality: Left;  . TRACHEOSTOMY TUBE PLACEMENT N/A 09/20/2017   Procedure: AWAKE INTUBATION WITH ANESTHESIA WITH VIDEO ASSISTANCE;  Surgeon: Helayne Seminole, MD;  Location: [facility];  Service: ENT;  Laterality: N/A;  . TRANSTHORACIC ECHOCARDIOGRAM  08/2014   Normal LV size and function.  Mild LVH.  EF 55-60%.  Normal regional wall motion.  GR 1 DD.  Normal RV size and function .  [name] TUBAL LIGATION     with reversal in 1994  . VENTRAL HERNIA REPAIR       OB History    Gravida  2   Para  2   Term      Preterm      AB      Living        SAB      TAB      Ectopic      Multiple      Live Births              Family History  Problem Relation Age of Onset  . Diabetes Father   . Heart attack Father 58  . Coronary artery disease Father   . Heart failure Father   . Throat cancer Father   . Hypertension Father   . Heart disease Father   . Sleep apnea Father   . Obesity Father   . COPD Mother   . Emphysema Mother   . Asthma Mother   . Heart failure Mother   . Breast cancer Mother   . Diabetes Mother   . Kidney disease Mother   . Thyroid disease Mother   . Heart attack Maternal Grandfather   . Sarcoidosis Maternal Uncle   . Lung cancer Brother   . Diabetes Brother   . Colon cancer Neg Hx   . Rectal cancer Neg Hx     Social History   Tobacco Use  . Smoking status: Never Smoker  . Smokeless tobacco: Never Used  Vaping Use  . Vaping Use: Never used  Substance Use Topics  . Alcohol use: No    Alcohol/week: 0.0 standard drinks  . Drug use: No    Home Medications Prior to Admission medications   Medication Sig Start Date End Date Taking? Authorizing Provider  albuterol (PROVENTIL HFA;VENTOLIN HFA) 108 (90 Base) MCG/ACT inhaler Inhale 1-2 puffs  into the lungs every 6 (six) hours as needed for wheezing or shortness of breath. 01/04/19   [name], MD  allopurinol (ZYLOPRIM) 100 MG tablet Take 100 mg by mouth every evening.     [provider]  amoxicillin-clavulanate (AUGMENTIN) 875-125 MG tablet Take 1 tablet by mouth 2 (two) times daily. 06/24/20   Inda Coke, PA  aspirin EC 81 MG tablet Take 81 mg by mouth daily.     [provider]  atorvastatin (LIPITOR) 40 MG tablet TAKE 1 TABLET BY MOUTH EVERY DAY 08/22/19   [name], MD  BREO ELLIPTA 200-25 MCG/INH AEPB TAKE 1 PUFF BY MOUTH EVERY DAY 06/04/19   [name], MD  buPROPion (WELLBUTRIN XL) 300 MG 24 hr tablet TAKE 1 TABLET BY MOUTH EVERY DAY 11/27/19   [name], MD  carvedilol (COREG) 25 MG tablet TAKE 1 TABLET (25 MG TOTAL) BY MOUTH 2 (TWO) TIMES DAILY WITH A MEAL. 11/29/19   Leonie Man, MD  chlorpheniramine-HYDROcodone [facility] PENNKINETIC ER) 10-8 MG/5ML SUER Take 5 mLs by mouth 2 (two) times daily. Patient taking differently: Take 5 mLs by mouth 2 (two) times daily as needed.  10/09/19   Vivi Barrack, MD  Colchicine 0.6 MG CAPS Take 0.6 mg by mouth 2 (two) times daily as needed for up to 4 days (may discontinue if symptoms improve). 05/15/20 05/27/20  Persons, Bevely Palmer, PA  dexlansoprazole (DEXILANT) 60 MG capsule Take 1 capsule (60 mg total) by mouth daily. 02/14/20   [name], MD  diclofenac Sodium (VOLTAREN) 1 % GEL Apply 1 application topically 4 (four) times daily as needed (pain.).    [provider]  diltiazem (CARDIZEM CD) 120 MG 24 hr capsule TAKE 1 CAPSULE BY MOUTH EVERY DAY 03/05/20   [name], MD  Dulaglutide (TRULICITY) 3 TK/2.4OX SOPN Inject 0.5 mLs (3 mg total) into the skin once a week. 04/30/20   Renato Shin, MD  famotidine (PEPCID) 20 MG tablet Take 1 tablet (20 mg total) by mouth at bedtime. 12/13/18   Willia Craze, NP  fenofibrate 160 MG tablet TAKE 1 TABLET BY MOUTH EVERY DAY  02/07/20   Leonie Man, MD  fluticasone [facility]) 50 MCG/ACT nasal spray Place 2 sprays into both nostrils daily as needed for allergies. 01/22/20   [name], MD  furosemide (LASIX) 40 MG tablet TAKE 1 TABLET BY MOUTH TWICE A DAY 06/17/20   Leonie Man, MD  gabapentin (NEURONTIN) 100 MG capsule TAKE 1 CAPSULE BY MOUTH THREE TIMES A DAY 11/27/19   [name], MD  glucose blood (CONTOUR NEXT TEST) test strip 1 each by Other route 4 (four) times daily. And lancets 4/day 09/19/19   Renato Shin, MD  icosapent Ethyl (VASCEPA) 1 g capsule Take 2 capsules (2 g total) by mouth 2 (two) times daily. 08/30/19   Leonie Man, MD  insulin glargine (LANTUS SOLOSTAR) 100 UNIT/ML Solostar Pen Inject 30 Units into the skin every morning. 04/30/20   Renato Shin, MD  insulin lispro (HUMALOG KWIKPEN) 100 UNIT/ML KwikPen Inject 0.15 mLs (15 Units total) into the skin daily with supper. 04/30/20   Renato Shin, MD  Insulin Pen Needle (PEN NEEDLES) 32G X 4 MM MISC 1 each by Does not apply route 4 (four) times daily. E11.9 02/22/20   Renato Shin, MD  isosorbide mononitrate (IMDUR) 30 MG 24 hr tablet TAKE 1 TABLET (30 MG TOTAL) BY MOUTH AT BEDTIME. 12/25/19   Leonie Man, MD  KLOR-CON M20 20 MEQ tablet TAKE 1 & 1/2 TABLETS BY MOUTH TWICE A DAY 11/27/19  [name], MD  lactulose (CHRONULAC) 10 GM/15ML solution TAKE 15 MLS (10 G TOTAL) BY MOUTH DAILY AS NEEDED (CONSTIPATION). 07/01/20   [name], MD  LINZESS 290 MCG CAPS capsule TAKE 1 CAPSULE (290 MCG TOTAL) BY MOUTH DAILY BEFORE BREAKFAST. 06/23/20   Irene Shipper, MD  LORazepam (ATIVAN) 0.5 MG tablet Take 0.5 mg by mouth every 12 (twelve) hours as needed for anxiety.    [provider]  metFORMIN (GLUCOPHAGE) 1000 MG tablet TAKE 1 TABLET BY MOUTH 2 TIMES DAILY WITH A MEAL. MUST CALL TO SCHEDULE AN APPT. 05/05/20   Renato Shin, MD  Microlet Lancets MISC 1 each by Does not apply route 2 (two) times daily. E11.9 09/19/19    Renato Shin, MD  nitroGLYCERIN (NITROSTAT) 0.4 MG SL tablet Place 1 tablet (0.4 mg total) under the tongue every 5 (five) minutes as needed for chest pain. Reported on 01/05/2016 01/10/18   Leonie Man, MD  ondansetron (ZOFRAN-ODT) 4 MG disintegrating tablet Take 1 tablet (4 mg total) by mouth every 8 (eight) hours as needed for nausea or vomiting. 12/13/18   Willia Craze, NP  predniSONE (DELTASONE) 5 MG tablet Take 5 mg by mouth daily with breakfast.    [provider]  telmisartan (MICARDIS) 20 MG tablet TAKE 1 TABLET BY MOUTH EVERY DAY 07/02/20   [name], MD  tiZANidine (ZANAFLEX) 4 MG tablet Take 0.5-1 tablets (2-4 mg total) by mouth every 8 (eight) hours as needed for muscle spasms. 07/03/20   Gregor Hams, MD  venlafaxine XR (EFFEXOR-XR) 75 MG 24 hr capsule TAKE 1 CAPSULE BY MOUTH TWICE A DAY 12/17/19   [name], MD  Vitamin D, Ergocalciferol, (DRISDOL) 1.25 MG (50000 UNIT) CAPS capsule Take 1 capsule (50,000 Units total) by mouth every 7 (seven) days. 04/15/20   Jearld Lesch A, DO  mupirocin nasal ointment (BACTROBAN) 2 % Place 1 application into the nose 2 (two) times daily. Use one-half of tube in each nostril twice daily for five (5) days. After application, press sides of nose together and gently massage. 03/28/18 03/28/18  [name], MD    Allergies    Methotrexate, Vancomycin, Lisinopril, Chlorhexidine, Clindamycin/lincomycin, Doxycycline, and Teflaro [ceftaroline]  Review of Systems   Review of Systems  HENT: Positive for congestion and sore throat.   Respiratory: Positive for cough.   All other systems reviewed and are negative.   Physical Exam Updated Vital Signs BP 140/68 (BP Location: Right Arm)   Pulse 81   Temp 98.2 F (36.8 C) (Oral)   Resp 16   Ht 5\' 6"  (1.676 m)   Wt 102.5 kg   SpO2 99%   BMI 36.48 kg/m   Physical Exam Vitals and nursing note reviewed.  Constitutional:      General: She is not in acute distress.     Appearance: She is well-developed. She is obese.     Comments: Sitting in the bed in no acute distress  HENT:     Head: Normocephalic and atraumatic.  Eyes:     Conjunctiva/sclera: Conjunctivae normal.     Pupils: Pupils are equal, round, and reactive to light.  Cardiovascular:     Rate and Rhythm: Normal rate and regular rhythm.     Pulses: Normal pulses.  Pulmonary:     Effort: Pulmonary effort is normal. No respiratory distress.     Breath sounds: Normal breath sounds. No wheezing.     Comments: Speaking in full sentences.  Clear lung sounds in all fields.  Sats stable on room air. Abdominal:     General: There is no distension.     Palpations: Abdomen is soft. There is no mass.     Tenderness: There is no abdominal tenderness. There is no guarding or rebound.  Musculoskeletal:        General: Normal range of motion.     Cervical back: Normal range of motion and neck supple.  Skin:    General: Skin is warm and dry.     Capillary Refill: Capillary refill takes less than 2 seconds.  Neurological:     Mental Status: She is alert and oriented to person, place, and time.     ED Results / Procedures / Treatments   Labs (all labs ordered are listed, but only abnormal results are displayed) Labs Reviewed  RESPIRATORY PANEL BY RT PCR (FLU A&B, COVID)    EKG None  Radiology DG Chest Portable 1 View  Result Date: 07/07/2020 CLINICAL DATA:  Cough, exposure to COVID EXAM: PORTABLE CHEST 1 VIEW COMPARISON:  Feb 11, 2020 FINDINGS: Trachea midline. Cardiomediastinal contours accentuated by portable technique, stable compared to prior study. Lungs are clear.  No pleural effusion. On limited assessment no acute skeletal process. IMPRESSION: No acute cardiopulmonary disease. Electronically Signed   By: Zetta Bills M.D.   On: 07/07/2020 19:03    Procedures Procedures (including critical care time)  Medications Ordered in ED Medications - No data to display  ED Course  I have  reviewed the triage vital signs and the nursing notes.  Pertinent labs & imaging results that were available during my care of the patient were reviewed by me and considered in my medical decision making (see chart for details).    MDM Rules/Calculators/A&P                          Patient presenting for evaluation of cough, hoarseness, and congestion after a Covid exposure yesterday.  On exam, patient appears nontoxic.  Pulmonary exam is reassuring.  Chest x-ray obtained from triage read interpreted by me, no pneumonia conversant effusion.  Covid test obtained from triage negative.  I discussed with patient that early on in the illness, Covid test may be negative and then be positive later.  I recommended quarantine and retesting to ensure a true negative.  Discussed continue monitoring her symptoms and symptomatic treatment at home.  At this time, patient appears safe for discharge.  Return precautions given.  Patient states she understands and agrees to plan.  Final Clinical Impression(s) / ED Diagnoses Final diagnoses:  Viral URI with cough    Rx / DC Orders ED Discharge Orders    None       Franchot Heidelberg, PA-C 07/07/20 2139    Little, Wenda Overland, MD 07/08/20 1546

## 2020-07-07 NOTE — Discharge Instructions (Signed)
Your Covid test was negative today. However sometimes you can have a negative Covid test early on in your symptoms.  As such, I recommend you quarantine and retest to ensure you are truly negative. In the meantime, treat symptomatically.  Use Tylenol or ibuprofen as needed for fevers or body aches. Use cough drops/syrup as needed for cough. Use honey to help with sore throat.   Make sure you are staying well-hydrated with water. When you get retested, this can be done at an outpatient site such as [facility], [name], or drive-through testing. Return to the emergency room if you develop increased difficulty breathing, severe worsening chest pain, any new, sudden, concerning symptoms.

## 2020-07-07 NOTE — ED Triage Notes (Signed)
C/o exposure to covid, today cough , congestion and sore throat.

## 2020-07-08 ENCOUNTER — Telehealth: Payer: 59 | Admitting: Family Medicine

## 2020-07-08 LAB — SARS-COV-2, NAA 2 DAY TAT

## 2020-07-08 LAB — NOVEL CORONAVIRUS, NAA: SARS-CoV-2, NAA: NOT DETECTED

## 2020-07-09 ENCOUNTER — Other Ambulatory Visit: Payer: Self-pay | Admitting: Cardiology

## 2020-07-14 ENCOUNTER — Telehealth: Payer: Self-pay | Admitting: Family Medicine

## 2020-07-14 NOTE — Telephone Encounter (Signed)
[name] really having trouble with her LBP, unable to get comfortable. PT does not start for another two weeks. She has tried the TENS unit and muscle relaxers.  She is looking for advice, is willing to come in for another visit but wanted to see if Dr. Georgina Snell had any suggestions.

## 2020-07-15 MED ORDER — HYDROCODONE-ACETAMINOPHEN 5-325 MG PO TABS: 1.0000 | ORAL_TABLET | Freq: Four times a day (QID) | ORAL | 0 refills | Status: DC | PRN

## 2020-07-15 MED ORDER — GABAPENTIN 300 MG PO CAPS: 300.0000 mg | ORAL_CAPSULE | Freq: Three times a day (TID) | ORAL | 3 refills | Status: DC | PRN

## 2020-07-15 NOTE — Telephone Encounter (Signed)
Called patient back she is having quite a bit of pain in her hip and into her thigh.  Physical therapy does not start until October 18.  Discussed options.  Plan for limited hydrocodone.  She is taken this in the past the done pretty well.  Additionally will increase gabapentin from 100 mg 3 times daily to 300 mg 3 times daily as needed.  Patient will send me an update my me know how she feels if not controlled.

## 2020-07-16 ENCOUNTER — Other Ambulatory Visit: Payer: Self-pay

## 2020-07-16 ENCOUNTER — Ambulatory Visit (AMBULATORY_SURGERY_CENTER): Payer: Self-pay | Admitting: *Deleted

## 2020-07-16 VITALS — Ht 66.0 in | Wt 232.0 lb

## 2020-07-16 DIAGNOSIS — Z1211 Encounter for screening for malignant neoplasm of colon: Secondary | ICD-10-CM

## 2020-07-16 MED ORDER — SUTAB 1479-225-188 MG PO TABS: 1.0000 | ORAL_TABLET | ORAL | 0 refills | Status: DC

## 2020-07-16 NOTE — Progress Notes (Signed)
Patient is here in-person for PV. Patient denies any allergies to eggs or soy. Patient denies any problems with anesthesia/sedation. Patient denies any oxygen use at home. Patient denies taking any diet/weight loss medications or blood thinners. Patient is not being treated for MRSA or C-diff. Patient is aware of our care-partner policy and [id number] safety protocol. EMMI education assisgned to the patient for the procedure, sent to MyChart.   COVID-19 vaccines completed on 01/16/20, per patient.   Prep Prescription coupon given to the patient. Pt will continue to take Linzzess and lactulose.

## 2020-07-17 ENCOUNTER — Other Ambulatory Visit: Payer: Self-pay | Admitting: Internal Medicine

## 2020-07-23 ENCOUNTER — Encounter: Payer: 59 | Admitting: Internal Medicine

## 2020-07-28 ENCOUNTER — Ambulatory Visit: Payer: 59 | Admitting: Physical Therapy

## 2020-07-30 ENCOUNTER — Telehealth: Payer: Self-pay

## 2020-07-30 NOTE — Telephone Encounter (Signed)
Confirmed patient wanted diabetic supplies sent.  Paperwork filled out and faxed.

## 2020-07-31 ENCOUNTER — Other Ambulatory Visit: Payer: Self-pay | Admitting: Cardiology

## 2020-07-31 ENCOUNTER — Ambulatory Visit: Payer: 59 | Admitting: Endocrinology

## 2020-08-03 ENCOUNTER — Other Ambulatory Visit: Payer: Self-pay | Admitting: Family Medicine

## 2020-08-05 ENCOUNTER — Telehealth (INDEPENDENT_AMBULATORY_CARE_PROVIDER_SITE_OTHER): Payer: 59 | Admitting: Family Medicine

## 2020-08-05 ENCOUNTER — Encounter: Payer: Self-pay | Admitting: Family Medicine

## 2020-08-05 DIAGNOSIS — R0981 Nasal congestion: Secondary | ICD-10-CM

## 2020-08-05 DIAGNOSIS — R519 Headache, unspecified: Secondary | ICD-10-CM

## 2020-08-05 DIAGNOSIS — Z881 Allergy status to other antibiotic agents status: Secondary | ICD-10-CM | POA: Diagnosis not present

## 2020-08-05 MED ORDER — AMOXICILLIN-POT CLAVULANATE 875-125 MG PO TABS: 1.0000 | ORAL_TABLET | Freq: Two times a day (BID) | ORAL | 0 refills | Status: DC

## 2020-08-05 NOTE — Progress Notes (Signed)
Virtual Visit via Video Note  I connected with [name]  on 08/05/20 at 11:20 AM EDT by a video enabled telemedicine application and verified that I am speaking with the correct person using two identifiers.  Location patient: home, [place] Location provider:work or home office Persons participating in the virtual visit: patient, provider  I discussed the limitations of evaluation and management by telemedicine and the availability of in person appointments. The patient expressed understanding and agreed to proceed.   HPI:  Acute telemedicine visit for Sinus issues: -Onset: 2 weeks ago -Symptoms include: sinus congestion, now progressed to thick greenish nasal congestion, facial maxillary pain and R ear discomfort -Denies: fevers, SOB, CP, NVD, worst HA of her life, inability to get out of bed/eat/drink -Has tried: OTC sinus medicine - did not help -Pertinent past medical history: allergies, history of sinus infection - denies abx use in the last 3 months -Pertinent medication allergies: doxycyline, clinda, vanc, teflaro(but tolerates other cephalosporins per her report and this was mild rash) -COVID-19 vaccine status: fully vaccinated  ROS: See pertinent positives and negatives per HPI.  Past Medical History:  Diagnosis Date  . Abnormal SPEP 04/17/2014  . Acute left ankle pain 01/26/2017  . ANEMIA-UNSPECIFIED 09/18/2009  . Anxiety   . Arthritis   . CHF (congestive heart failure) ([place])   . Chronic diastolic heart failure, NYHA class 2 (H[facility])    Normal LVEDP by May 2018  . COPD (chronic obstructive pulmonary disease) ([place])   . Depression   . DIABETES MELLITUS, TYPE II 08/21/2006  . Diabetic osteomyelitis ([place]) 05/29/2015  . Fatty liver   . Fracture of 5th metatarsal    non union  . GERD 69/62/9528   Had fundoplication  . GOUT 08/20/2010  . Hammer toe of right foot    2-5th toes  . Hx of umbilical hernia repair   . HYPERLIPIDEMIA 08/21/2006  . HYPERTENSION 08/21/2006  . Infection of  wound due to methicillin resistant Staphylococcus aureus (MRSA)   . Internal hemorrhoids   . Kidney problem   . Multiple allergies 10/14/2016  . OBESITY 06/04/2009  . Onychomycosis 10/27/2015  . Osteomyelitis of left foot ([place]) 05/29/2015  . Pulmonary sarcoidosis ([place])    Followed locally by pulmonology, but also by Dr. Casper Harrison at [facility]  . Right knee pain 01/26/2017  . SOB (shortness of breath)   . Vocal cord dysfunction   . Wears partial dentures     Past Surgical History:  Procedure Laterality Date  . ABDOMINAL HYSTERECTOMY    . APPENDECTOMY    . BLADDER SUSPENSION  11/11/2011   Procedure: TRANSVAGINAL TAPE (TVT) PROCEDURE;  Surgeon: Olga Millers, MD;  Location: [facility];  Service: Gynecology;  Laterality: N/A;  . CAROTIDS  02/18/11   CAROTID DUPLEX; VERTEBRALS ARE PATENT WITH ANTEGRADE FLOW. ICA/CCA RATIO 1.61 ON RIGHT AND 0.75 ON LEFT  . CHOLECYSTECTOMY  1984  . COLONOSCOPY  04/29/2010   [name]  . CYSTOSCOPY  11/11/2011   Procedure: CYSTOSCOPY;  Surgeon: Olga Millers, MD;  Location: [facility];  Service: Gynecology;  Laterality: N/A;  . EXTUBATION (ENDOTRACHEAL) IN OR N/A 09/23/2017   Procedure: EXTUBATION (ENDOTRACHEAL) IN OR;  Surgeon: Helayne Seminole, MD;  Location: [place];  Service: ENT;  Laterality: N/A;  . FIBEROPTIC LARYNGOSCOPY AND TRACHEOSCOPY N/A 09/23/2017   Procedure: FLEXIBLE FIBEROPTIC LARYNGOSCOPY;  Surgeon: Helayne Seminole, MD;  Location: [place];  Service: ENT;  Laterality: N/A;  . FRACTURE SURGERY     foot  .  HALLUX FUSION Left 10/02/2018   Procedure: HALLUX ARTHRODESIS;  Surgeon: Edrick Kins, DPM;  Location: [facility];  Service: Podiatry;  Laterality: Left;  . HAMMER TOE SURGERY Left 10/02/2018   Procedure: HAMMER TOE CORRECTION 2ND 3RD 4RD FIFTH TOE;  Surgeon: Edrick Kins, DPM;  Location: [facility];  Service: Podiatry;  Laterality: Left;  . HAMMER TOE SURGERY Right 04/12/2019   Procedure: HAMMER TOE CORRECTION, 2ND, 3RD, 4TH AND 5TH TOES OF  RIGHT FOOT;  Surgeon: Edrick Kins, DPM;  Location: [place];  Service: Podiatry;  Laterality: Right;  . HAMMER TOE SURGERY Left 10/25/2019   Procedure: HAMMER TOE REPAIR SECOND, THIRD, FOUTH;  Surgeon: Edrick Kins, DPM;  Location: [facility];  Service: Podiatry;  Laterality: Left;  . HERNIA REPAIR    . I & D EXTREMITY Left 06/27/2015   Procedure: Partial Excision Left Calcaneus, Place Antibiotic Beads, and Wound VAC;  Surgeon: Newt Minion, MD;  Location: [place];  Service: Orthopedics;  Laterality: Left;  . JOINT REPLACEMENT    . KNEE ARTHROSCOPY     right  . LEFT AND RIGHT HEART CATHETERIZATION WITH CORONARY ANGIOGRAM N/A 04/23/2013   Procedure: LEFT AND RIGHT HEART CATHETERIZATION WITH CORONARY ANGIOGRAM;  Surgeon: Leonie Man, MD;  Location[name];  Service: Cardiovascular;  Laterality: N/A;  . LEFT HEART CATH AND CORONARY ANGIOGRAPHY N/A 03/11/2017   Procedure: Left Heart Cath and Coronary Angiography;  Surgeon: Sherren Mocha, MD;  Location: [place] CV LAB; angiographically minimal CAD in the LAD otherwise normal.;  Normal LVEDP.  FALSE POSITIVE MYOVIEW  . LEFT HEART CATH AND CORONARY ANGIOGRAPHY  07/20/2010   LVEF 50-55% WITH VERY MILD GLOBAL HYPOKINESIA; ESSENTIALLY NORMAL CORONARY ARTERIES; NORMAL LV FUNCTION  . METATARSAL OSTEOTOMY WITH OPEN REDUCTION INTERNAL FIXATION (ORIF) METATARSAL WITH FUSION Left 04/09/2014   Procedure: LEFT FOOT FRACTURE OPEN TREATMENT METATARSAL INCLUDES INTERNAL FIXATION EACH;  Surgeon: Lorn Junes, MD;  Location: [place];  Service: Orthopedics;  Laterality: Left;  . NISSEN FUNDOPLICATION  8756  . NM MYOVIEW LTD  03/09/2013   Lexiscan --> EF 50%; NORMAL MYOCARDIAL PERFUSION STUDY - breast attenuation  . NM MYOVIEW LTD  03/10/2017   : Moderate size "stress-induced "perfusion defect at the apex as well as "ill-defined stress-induced perfusion defect in the lateral wall.  EF 55%.  INTERMEDIATE risk. -->  FALSE POSITIVE  . Right and  left CARDIAC CATHETERIZATION  04/23/2013   Angiographic normal coronaries; LVEDP 20 mmHg, PCWP 12-14 mmHg, RAP 12 mmHg.; Fick CO/CI 4.9/2.2  . SHOULDER ARTHROSCOPY Left 03/14/2019   Procedure: LEFT SHOULDER ARTHROSCOPY, DEBRIDEMENT, AND DECOMPRESSION;  Surgeon: Newt Minion, MD;  Location: [place];  Service: Orthopedics;  Laterality: Left;  . TOTAL KNEE ARTHROPLASTY Right 06/29/2017   Procedure: RIGHT TOTAL KNEE ARTHROPLASTY;  Surgeon: Newt Minion, MD;  Location: [place];  Service: Orthopedics;  Laterality: Right;  . TOTAL KNEE ARTHROPLASTY Left 12/07/2017  . TOTAL KNEE ARTHROPLASTY Left 12/07/2017   Procedure: LEFT TOTAL KNEE ARTHROPLASTY;  Surgeon: Newt Minion, MD;  Location: [place];  Service: Orthopedics;  Laterality: Left;  . TRACHEOSTOMY TUBE PLACEMENT N/A 09/20/2017   Procedure: AWAKE INTUBATION WITH ANESTHESIA WITH VIDEO ASSISTANCE;  Surgeon: Helayne Seminole, MD;  Location: [facility];  Service: ENT;  Laterality: N/A;  . TRANSTHORACIC ECHOCARDIOGRAM  08/2014   Normal LV size and function.  Mild LVH.  EF 55-60%.  Normal regional wall motion.  GR 1 DD.  Normal RV size  and function .  [name] TUBAL LIGATION     with reversal in 1994  . VENTRAL HERNIA REPAIR       Current Outpatient Medications:  .  albuterol (PROVENTIL HFA;VENTOLIN HFA) 108 (90 Base) MCG/ACT inhaler, Inhale 1-2 puffs into the lungs every 6 (six) hours as needed for wheezing or shortness of breath., Disp: 8 g, Rfl: 5 .  allopurinol (ZYLOPRIM) 100 MG tablet, Take 100 mg by mouth every evening. , Disp: , Rfl:  .  aspirin EC 81 MG tablet, Take 81 mg by mouth daily. , Disp: , Rfl:  .  atorvastatin (LIPITOR) 40 MG tablet, TAKE 1 TABLET BY MOUTH EVERY DAY, Disp: 90 tablet, Rfl: 3 .  BREO ELLIPTA 200-25 MCG/INH AEPB, TAKE 1 PUFF BY MOUTH EVERY DAY, Disp: 60 each, Rfl: 5 .  buPROPion (WELLBUTRIN XL) 300 MG 24 hr tablet, TAKE 1 TABLET BY MOUTH EVERY DAY, Disp: 90 tablet, Rfl: 3 .  carvedilol (COREG) 25 MG tablet, TAKE 1 TABLET (25 MG  TOTAL) BY MOUTH 2 (TWO) TIMES DAILY WITH A MEAL., Disp: 180 tablet, Rfl: 3 .  chlorpheniramine-HYDROcodone (TUSSIONEX PENNKINETIC ER) 10-8 MG/5ML SUER, Take 5 mLs by mouth 2 (two) times daily., Disp: 140 mL, Rfl: 0 .  dexlansoprazole (DEXILANT) 60 MG capsule, Take 1 capsule (60 mg total) by mouth daily., Disp: 30 capsule, Rfl: 5 .  diclofenac Sodium (VOLTAREN) 1 % GEL, Apply 1 application topically 4 (four) times daily as needed (pain.)., Disp: , Rfl:  .  diltiazem (CARDIZEM CD) 120 MG 24 hr capsule, TAKE 1 CAPSULE BY MOUTH EVERY DAY, Disp: 90 capsule, Rfl: 0 .  Dulaglutide (TRULICITY) 3 GY/5.6LS SOPN, Inject 0.5 mLs (3 mg total) into the skin once a week., Disp: 12 pen, Rfl: 3 .  famotidine (PEPCID) 20 MG tablet, Take 1 tablet (20 mg total) by mouth at bedtime., Disp: 30 tablet, Rfl: 3 .  fenofibrate 160 MG tablet, TAKE 1 TABLET BY MOUTH EVERY DAY, Disp: 30 tablet, Rfl: 0 .  fluticasone (FLONASE) 50 MCG/ACT nasal spray, Place 2 sprays into both nostrils daily as needed for allergies., Disp: 48 mL, Rfl: 3 .  furosemide (LASIX) 40 MG tablet, TAKE 1 TABLET BY MOUTH TWICE A DAY, Disp: 180 tablet, Rfl: 1 .  gabapentin (NEURONTIN) 100 MG capsule, TAKE 1 CAPSULE BY MOUTH THREE TIMES A DAY, Disp: 270 capsule, Rfl: 3 .  glucose blood (CONTOUR NEXT TEST) test strip, 1 each by Other route 4 (four) times daily. And lancets 4/day, Disp: 400 each, Rfl: 3 .  HYDROcodone-acetaminophen (NORCO/VICODIN) 5-325 MG tablet, Take 1 tablet by mouth every 6 (six) hours as needed., Disp: 15 tablet, Rfl: 0 .  insulin glargine (LANTUS SOLOSTAR) 100 UNIT/ML Solostar Pen, Inject 30 Units into the skin every morning., Disp: 18 mL, Rfl: 2 .  insulin lispro (HUMALOG KWIKPEN) 100 UNIT/ML KwikPen, Inject 0.15 mLs (15 Units total) into the skin daily with supper., Disp: 15 mL, Rfl: 29 .  Insulin Pen Needle (PEN NEEDLES) 32G X 4 MM MISC, 1 each by Does not apply route 4 (four) times daily. E11.9, Disp: 400 each, Rfl: 0 .  isosorbide  mononitrate (IMDUR) 30 MG 24 hr tablet, TAKE 1 TABLET BY MOUTH AT BEDTIME., Disp: 90 tablet, Rfl: 0 .  KLOR-CON M20 20 MEQ tablet, TAKE 1 & 1/2 TABLETS BY MOUTH TWICE A DAY, Disp: 270 tablet, Rfl: 2 .  lactulose (CHRONULAC) 10 GM/15ML solution, TAKE 15 MLS (10 G TOTAL) BY MOUTH DAILY AS NEEDED (CONSTIPATION)., Disp: 473  mL, Rfl: 1 .  LINZESS 290 MCG CAPS capsule, TAKE 1 CAPSULE (290 MCG TOTAL) BY MOUTH DAILY BEFORE BREAKFAST., Disp: 30 capsule, Rfl: 3 .  LORazepam (ATIVAN) 0.5 MG tablet, Take 0.5 mg by mouth every 12 (twelve) hours as needed for anxiety., Disp: , Rfl:  .  metFORMIN (GLUCOPHAGE) 1000 MG tablet, TAKE 1 TABLET BY MOUTH 2 TIMES DAILY WITH A MEAL. MUST CALL TO SCHEDULE AN APPT., Disp: 180 tablet, Rfl: 1 .  Microlet Lancets MISC, 1 each by Does not apply route 2 (two) times daily. E11.9, Disp: 100 each, Rfl: 2 .  nitroGLYCERIN (NITROSTAT) 0.4 MG SL tablet, Place 1 tablet (0.4 mg total) under the tongue every 5 (five) minutes as needed for chest pain. Reported on 01/05/2016, Disp: 25 tablet, Rfl: 6 .  ondansetron (ZOFRAN-ODT) 4 MG disintegrating tablet, Take 1 tablet (4 mg total) by mouth every 8 (eight) hours as needed for nausea or vomiting., Disp: 50 tablet, Rfl: 2 .  predniSONE (DELTASONE) 5 MG tablet, Take 5 mg by mouth daily with breakfast., Disp: , Rfl:  .  Sodium Sulfate-Mag Sulfate-KCl (SUTAB) [phone] MG TABS, Take 1 kit by mouth as directed. MANUFACTURER CODES!! BIN: [id number] PCN: CN GROUP: [id number] MEMBER ID: [id number];WSF AS CASH;NO PRIOR AUTHORIZATION, Disp: 24 tablet, Rfl: 0 .  telmisartan (MICARDIS) 20 MG tablet, TAKE 1 TABLET BY MOUTH EVERY DAY, Disp: 90 tablet, Rfl: 1 .  tiZANidine (ZANAFLEX) 4 MG tablet, Take 0.5-1 tablets (2-4 mg total) by mouth every 8 (eight) hours as needed for muscle spasms., Disp: 30 tablet, Rfl: 1 .  venlafaxine XR (EFFEXOR-XR) 75 MG 24 hr capsule, TAKE 1 CAPSULE BY MOUTH TWICE A DAY, Disp: 180 capsule, Rfl: 3 .  amoxicillin-clavulanate  (AUGMENTIN) 875-125 MG tablet, Take 1 tablet by mouth 2 (two) times daily., Disp: 20 tablet, Rfl: 0 .  Colchicine 0.6 MG CAPS, Take 0.6 mg by mouth 2 (two) times daily as needed for up to 4 days (may discontinue if symptoms improve)., Disp: 20 capsule, Rfl: 1  EXAM:  VITALS per patient if applicable:  GENERAL: alert, oriented, appears well and in no acute distress  HEENT: atraumatic, conjunttiva clear, no obvious abnormalities on inspection of external nose and ears  NECK: normal movements of the head and neck  LUNGS: on inspection no signs of respiratory distress, breathing rate appears normal, no obvious gross SOB, gasping or wheezing  CV: no obvious cyanosis  MS: moves all visible extremities without noticeable abnormality  PSYCH/NEURO: pleasant and cooperative, no obvious depression or anxiety, speech and thought processing grossly intact  ASSESSMENT AND PLAN:  Discussed the following assessment and plan:  Sinus congestion  Facial discomfort  Hx of antibiotic allergy  -we discussed possible serious and likely etiologies, options for evaluation and workup, limitations of telemedicine visit vs in person visit, treatment, treatment risks and precautions. Pt prefers to treat via telemedicine empirically rather than in person at this moment.  Query possible sinusitis given duration of symptoms and worsening.  She opted for empiric treatment with Augmentin 875 twice daily for 7 to 10 days.  We discussed her various antibiotic allergies and she opted for this despite the possible mild rash with ceftaroline in the past.  She reports she did tolerate ceftriaxone in the past.  Did advise if she has any signs or symptoms of allergic reaction that she seek in person care.  Advised to seek prompt in person care if worsening, new symptoms arise, or if is not improving with treatment. Discussed options  for inperson care if PCP office not available. Did let this patient know that I only do  telemedicine on Tuesdays and Thursdays for [place]. Advised to schedule follow up visit with PCP or [facility] if any further questions or concerns to avoid delays in care.   I discussed the assessment and treatment plan with the patient. The patient was provided an opportunity to ask questions and all were answered. The patient agreed with the plan and demonstrated an understanding of the instructions.     Lucretia Kern, DO

## 2020-08-07 DIAGNOSIS — Z743 Need for continuous supervision: Secondary | ICD-10-CM | POA: Diagnosis not present

## 2020-08-07 DIAGNOSIS — D869 Sarcoidosis, unspecified: Secondary | ICD-10-CM | POA: Diagnosis present

## 2020-08-07 DIAGNOSIS — Z794 Long term (current) use of insulin: Secondary | ICD-10-CM | POA: Diagnosis not present

## 2020-08-07 DIAGNOSIS — R269 Unspecified abnormalities of gait and mobility: Secondary | ICD-10-CM | POA: Diagnosis present

## 2020-08-07 DIAGNOSIS — R6889 Other general symptoms and signs: Secondary | ICD-10-CM | POA: Diagnosis not present

## 2020-08-07 DIAGNOSIS — Z01818 Encounter for other preprocedural examination: Secondary | ICD-10-CM | POA: Diagnosis not present

## 2020-08-07 DIAGNOSIS — E119 Type 2 diabetes mellitus without complications: Secondary | ICD-10-CM | POA: Diagnosis not present

## 2020-08-07 DIAGNOSIS — S8991XA Unspecified injury of right lower leg, initial encounter: Secondary | ICD-10-CM | POA: Diagnosis not present

## 2020-08-07 DIAGNOSIS — I10 Essential (primary) hypertension: Secondary | ICD-10-CM | POA: Diagnosis present

## 2020-08-07 DIAGNOSIS — Z9889 Other specified postprocedural states: Secondary | ICD-10-CM | POA: Diagnosis not present

## 2020-08-07 DIAGNOSIS — Z8249 Family history of ischemic heart disease and other diseases of the circulatory system: Secondary | ICD-10-CM | POA: Diagnosis not present

## 2020-08-07 DIAGNOSIS — R0902 Hypoxemia: Secondary | ICD-10-CM | POA: Diagnosis not present

## 2020-08-07 DIAGNOSIS — M9702XA Periprosthetic fracture around internal prosthetic left hip joint, initial encounter: Secondary | ICD-10-CM | POA: Diagnosis not present

## 2020-08-07 DIAGNOSIS — E785 Hyperlipidemia, unspecified: Secondary | ICD-10-CM | POA: Diagnosis not present

## 2020-08-07 DIAGNOSIS — M109 Gout, unspecified: Secondary | ICD-10-CM | POA: Diagnosis present

## 2020-08-07 DIAGNOSIS — D62 Acute posthemorrhagic anemia: Secondary | ICD-10-CM | POA: Diagnosis not present

## 2020-08-07 DIAGNOSIS — Z79899 Other long term (current) drug therapy: Secondary | ICD-10-CM | POA: Diagnosis not present

## 2020-08-07 DIAGNOSIS — I272 Pulmonary hypertension, unspecified: Secondary | ICD-10-CM | POA: Diagnosis not present

## 2020-08-07 DIAGNOSIS — Z20822 Contact with and (suspected) exposure to covid-19: Secondary | ICD-10-CM | POA: Diagnosis not present

## 2020-08-07 DIAGNOSIS — S59901A Unspecified injury of right elbow, initial encounter: Secondary | ICD-10-CM | POA: Diagnosis not present

## 2020-08-07 DIAGNOSIS — M80059A Age-related osteoporosis with current pathological fracture, unspecified femur, initial encounter for fracture: Secondary | ICD-10-CM | POA: Diagnosis not present

## 2020-08-07 DIAGNOSIS — R609 Edema, unspecified: Secondary | ICD-10-CM | POA: Diagnosis not present

## 2020-08-07 DIAGNOSIS — I517 Cardiomegaly: Secondary | ICD-10-CM | POA: Diagnosis not present

## 2020-08-07 DIAGNOSIS — S72492A Other fracture of lower end of left femur, initial encounter for closed fracture: Secondary | ICD-10-CM | POA: Diagnosis not present

## 2020-08-07 DIAGNOSIS — Z7982 Long term (current) use of aspirin: Secondary | ICD-10-CM | POA: Diagnosis not present

## 2020-08-07 DIAGNOSIS — I951 Orthostatic hypotension: Secondary | ICD-10-CM | POA: Diagnosis present

## 2020-08-07 DIAGNOSIS — Z833 Family history of diabetes mellitus: Secondary | ICD-10-CM | POA: Diagnosis not present

## 2020-08-07 DIAGNOSIS — M9712XA Periprosthetic fracture around internal prosthetic left knee joint, initial encounter: Secondary | ICD-10-CM | POA: Diagnosis present

## 2020-08-07 DIAGNOSIS — M25562 Pain in left knee: Secondary | ICD-10-CM | POA: Diagnosis not present

## 2020-08-07 DIAGNOSIS — Z9049 Acquired absence of other specified parts of digestive tract: Secondary | ICD-10-CM | POA: Diagnosis not present

## 2020-08-07 DIAGNOSIS — S72402A Unspecified fracture of lower end of left femur, initial encounter for closed fracture: Secondary | ICD-10-CM | POA: Diagnosis not present

## 2020-08-07 LAB — POCT INR: INR: 1 (ref 0.9–1.1)

## 2020-08-07 LAB — PROTIME-INR: Protime: 13.3 (ref 10.0–13.8)

## 2020-08-08 ENCOUNTER — Ambulatory Visit: Payer: 59 | Admitting: Family

## 2020-08-08 ENCOUNTER — Telehealth: Payer: Self-pay | Admitting: Internal Medicine

## 2020-08-08 HISTORY — PX: ORIF DISTAL FEMUR FRACTURE: SUR926

## 2020-08-08 LAB — HEMOGLOBIN A1C: Hemoglobin A1C: 7.9

## 2020-08-08 LAB — CBC AND DIFFERENTIAL
HCT: 32 — AB (ref 36–46)
Hemoglobin: 10.4 — AB (ref 12.0–16.0)
Platelets: 352 (ref 150–399)
WBC: 16.8

## 2020-08-08 LAB — BASIC METABOLIC PANEL
BUN: 18 (ref 4–21)
CO2: 28 — AB (ref 13–22)
Chloride: 98 — AB (ref 99–108)
Creatinine: 1 (ref 0.5–1.1)
Glucose: 223
Potassium: 4 (ref 3.4–5.3)
Sodium: 136 — AB (ref 137–147)

## 2020-08-08 LAB — CBC: RBC: 3.69 — AB (ref 3.87–5.11)

## 2020-08-08 LAB — COMPREHENSIVE METABOLIC PANEL: Calcium: 9.1 (ref 8.7–10.7)

## 2020-08-09 LAB — BASIC METABOLIC PANEL
BUN: 14 (ref 4–21)
CO2: 28 — AB (ref 13–22)
Chloride: 98 — AB (ref 99–108)
Creatinine: 1 (ref 0.5–1.1)
Glucose: 308
Potassium: 3.8 (ref 3.4–5.3)
Sodium: 131 — AB (ref 137–147)
Sodium: 131 — AB (ref 137–147)

## 2020-08-09 LAB — CBC AND DIFFERENTIAL
HCT: 27 — AB (ref 36–46)
Hemoglobin: 8 — AB (ref 12.0–16.0)
Platelets: 352 (ref 150–399)
WBC: 18.6

## 2020-08-09 LAB — COMPREHENSIVE METABOLIC PANEL: Calcium: 8.1 — AB (ref 8.7–10.7)

## 2020-08-11 ENCOUNTER — Encounter: Payer: 59 | Admitting: Internal Medicine

## 2020-08-11 LAB — BASIC METABOLIC PANEL
BUN: 14 (ref 4–21)
CO2: 28 — AB (ref 13–22)
Chloride: 98 — AB (ref 99–108)
Creatinine: 1 (ref 0.5–1.1)
Glucose: 308
Potassium: 3.8 (ref 3.4–5.3)

## 2020-08-11 LAB — CBC AND DIFFERENTIAL
HCT: 23 — AB (ref 36–46)
Hemoglobin: 7.6 — AB (ref 12.0–16.0)
Platelets: 322 (ref 150–399)
WBC: 13

## 2020-08-11 LAB — CBC: RBC: 2.7 — AB (ref 3.87–5.11)

## 2020-08-11 LAB — COMPREHENSIVE METABOLIC PANEL: Calcium: 8.1 — AB (ref 8.7–10.7)

## 2020-08-12 ENCOUNTER — Ambulatory Visit: Payer: 59 | Admitting: Physical Therapy

## 2020-08-12 HISTORY — PX: TRANSTHORACIC ECHOCARDIOGRAM: SHX275

## 2020-08-12 LAB — CBC AND DIFFERENTIAL
HCT: 21 — AB (ref 36–46)
Hemoglobin: 6.8 — AB (ref 12.0–16.0)
Platelets: 299 (ref 150–399)
WBC: 11.2

## 2020-08-12 LAB — CBC: RBC: 2.4 — AB (ref 3.87–5.11)

## 2020-08-13 ENCOUNTER — Ambulatory Visit: Payer: 59 | Admitting: Endocrinology

## 2020-08-14 ENCOUNTER — Telehealth: Payer: Self-pay

## 2020-08-14 ENCOUNTER — Other Ambulatory Visit: Payer: Self-pay | Admitting: Family Medicine

## 2020-08-14 NOTE — Telephone Encounter (Signed)
The appointment for Friday was taken, is there anywhere else we can get [name] in?

## 2020-08-14 NOTE — Telephone Encounter (Signed)
Slot was taken. SDA slot ok?

## 2020-08-14 NOTE — Telephone Encounter (Signed)
At the moment there is a 920 slot on Friday morning available

## 2020-08-14 NOTE — Telephone Encounter (Signed)
Patient is calling in asking to get scheduled for a hospital follow up, and to check on her hemoglobin, as when she was in there she had to have a blood transfusion. No appointments available for a hospital follow up.

## 2020-08-14 NOTE — Telephone Encounter (Signed)
Patient is scheduled for Hospital Follow up on 11/11 for 1:20 pm.

## 2020-08-14 NOTE — Telephone Encounter (Signed)
When would you like pt to come?

## 2020-08-14 NOTE — Telephone Encounter (Signed)
Noted  

## 2020-08-14 NOTE — Telephone Encounter (Signed)
See below

## 2020-08-15 ENCOUNTER — Telehealth: Payer: Self-pay

## 2020-08-15 NOTE — Telephone Encounter (Signed)
Yes thanks 

## 2020-08-15 NOTE — Telephone Encounter (Signed)
Home Health Verbal Orders  Agency:  [facility]   Caller: Sharyn Lull -----------------------------------------  Requesting Skilled nursing    Frequency:  Once a week for 4 weeks.

## 2020-08-15 NOTE — Telephone Encounter (Signed)
Ok to give verbal orders? Please advise  

## 2020-08-18 ENCOUNTER — Telehealth: Payer: Self-pay

## 2020-08-18 NOTE — Telephone Encounter (Signed)
Called and provided VO to [place].

## 2020-08-18 NOTE — Telephone Encounter (Signed)
Home Health Verbal Orders  Agency:  [facility]  Caller: [place]    Requesting OT   Frequency:  Once a week for 8 weeks.

## 2020-08-18 NOTE — Telephone Encounter (Signed)
Yes thanks as long as she keeps 08/21/20 visit

## 2020-08-18 NOTE — Telephone Encounter (Signed)
Ok to give verbal orders? Please advise  

## 2020-08-18 NOTE — Telephone Encounter (Signed)
Called and provided VO to [place].

## 2020-08-20 NOTE — Progress Notes (Signed)
Phone [phone] In person visit   Subjective:   [name] is a 59 y.o. year old very pleasant female patient who presents for/with See problem oriented charting Chief Complaint  Patient presents with  . Hospitalization Follow-up   This visit occurred during the SARS-CoV-2 public health emergency.  Safety protocols were in place, including screening questions prior to the visit, additional usage of staff PPE, and extensive cleaning of exam room while observing appropriate contact time as indicated for disinfecting solutions.   Past Medical History-  Patient Active Problem List   Diagnosis Date Noted  . Severe persistent asthma 01/04/2019    Priority: High  . Subcortical microvascular ischemic occlusive disease 07/12/2018    Priority: High  . Obstructive chronic bronchitis without exacerbation ([place]) 09/18/2013    Priority: High  . Chest pain 04/11/2013    Priority: High  . Hypertensive heart disease with chronic diastolic congestive heart failure ([place])     Priority: High  . Sarcoidosis of lung ([place]) 04/10/2007    Priority: High  . Type II diabetes mellitus with renal manifestations ([place]) 08/21/2006    Priority: High  . Hypertriglyceridemia 04/02/2020    Priority: Medium  . Aortic atherosclerosis ([place]) 02/14/2020    Priority: Medium  . Constipation 04/21/2018    Priority: Medium  . Epiglottitis     Priority: Medium  . Osteoarthritis of right knee 01/26/2017    Priority: Medium  . CKD (chronic kidney disease) stage 3, GFR 30-59 ml/min (HCC) 04/27/2015    Priority: Medium  . History of osteomyelitis 03/27/2015    Priority: Medium  . Fatty liver 09/30/2014    Priority: Medium  . Major depression in full remission ([place])     Priority: Medium  . Gout 08/20/2010    Priority: Medium  . Anemia 09/18/2009    Priority: Medium  . Sleep apnea 04/21/2009    Priority: Medium  . Hyperlipidemia associated with type 2 diabetes mellitus ([place]) 08/21/2006    Priority: Medium   . Hypertension associated with diabetes ([place]) 08/21/2006    Priority: Medium  . Nontraumatic complete tear of left rotator cuff     Priority: Low  . Diabetic neuropathy ([place]) 02/20/2019    Priority: Low  . Rapid palpitations 07/12/2018    Priority: Low  . Impingement of left ankle joint 06/26/2018    Priority: Low  . Total knee replacement status, left 12/07/2017    Priority: Low  . Dysphagia 09/20/2017    Priority: Low  . Plantar fasciitis, right 07/13/2017    Priority: Low  . S/P total knee arthroplasty, right 06/29/2017    Priority: Low  . Osteoarthrosis, localized, primary, knee, right     Priority: Low  . Sprain of calcaneofibular ligament of right ankle 06/06/2017    Priority: Low  . Onychomycosis 10/27/2015    Priority: Low  . MRSA (methicillin resistant staph aureus) culture positive 03/27/2015    Priority: Low  . Hot flashes 07/15/2014    Priority: Low  . Abnormal SPEP 04/17/2014    Priority: Low  . Fracture of left leg 04/17/2014    Priority: Low  . Cushingoid side effect of steroids ([place]) 04/17/2014    Priority: Low  . Internal hemorrhoids     Priority: Low  . Preoperative clearance 03/25/2014    Priority: Low  . Solitary pulmonary nodule, on CT 02/2013 - stable over 2 years in 2015 02/20/2013    Priority: Low  . Morbid obesity ([place]) 06/04/2009    Priority: Low  .  GERD 08/21/2006    Priority: Low    Medications- reviewed and updated Current Outpatient Medications  Medication Sig Dispense Refill  . acetaminophen (TYLENOL) 325 MG tablet Take 650 mg by mouth as needed.    [name] albuterol (PROVENTIL HFA;VENTOLIN HFA) 108 (90 Base) MCG/ACT inhaler Inhale 1-2 puffs into the lungs every 6 (six) hours as needed for wheezing or shortness of breath. 8 g 5  . allopurinol (ZYLOPRIM) 100 MG tablet Take 100 mg by mouth every evening.     [name] aspirin EC 81 MG tablet Take 162 mg by mouth daily.     [name] atorvastatin (LIPITOR) 40 MG tablet TAKE 1 TABLET BY MOUTH EVERY DAY 90  tablet 3  . BREO ELLIPTA 200-25 MCG/INH AEPB TAKE 1 PUFF BY MOUTH EVERY DAY 60 each 5  . buPROPion (WELLBUTRIN XL) 300 MG 24 hr tablet TAKE 1 TABLET BY MOUTH EVERY DAY 90 tablet 3  . calcium carbonate (OS-CAL - DOSED IN MG OF ELEMENTAL CALCIUM) 1250 (500 Ca) MG tablet Take 1 tablet by mouth daily.    . carvedilol (COREG) 12.5 MG tablet Take 12.5 mg by mouth 2 (two) times daily with a meal.    . chlorpheniramine-HYDROcodone (TUSSIONEX PENNKINETIC ER) 10-8 MG/5ML SUER Take 5 mLs by mouth 2 (two) times daily. (Patient taking differently: Take 5 mLs by mouth as needed. ) 140 mL 0  . Cholecalciferol 25 MCG (1000 UT) capsule Take 1,000 Units by mouth in the morning and at bedtime.    . Colchicine 0.6 MG CAPS Take 0.6 mg by mouth 2 (two) times daily as needed for up to 4 days (may discontinue if symptoms improve). 20 capsule 1  . DEXILANT 60 MG capsule TAKE 1 CAPSULE BY MOUTH EVERY DAY 90 capsule 3  . diclofenac Sodium (VOLTAREN) 1 % GEL Apply 1 application topically as needed (pain.).     [name] diltiazem (CARDIZEM CD) 120 MG 24 hr capsule TAKE 1 CAPSULE BY MOUTH EVERY DAY 90 capsule 0  . Dulaglutide (TRULICITY) 3 ON/6.2XB SOPN Inject 0.5 mLs (3 mg total) into the skin once a week. 12 pen 3  . enoxaparin (LOVENOX) 40 MG/0.4ML injection Inject 40 mg into the skin daily.    . famotidine (PEPCID) 20 MG tablet Take 1 tablet (20 mg total) by mouth at bedtime. 30 tablet 3  . fenofibrate 160 MG tablet TAKE 1 TABLET BY MOUTH EVERY DAY 30 tablet 0  . ferrous gluconate (FERGON) 324 MG tablet Take 324 mg by mouth in the morning, at noon, and at bedtime.    . fluticasone (FLONASE) 50 MCG/ACT nasal spray Place 2 sprays into both nostrils daily as needed for allergies. 48 mL 3  . furosemide (LASIX) 40 MG tablet TAKE 1 TABLET BY MOUTH TWICE A DAY 180 tablet 1  . gabapentin (NEURONTIN) 100 MG capsule TAKE 1 CAPSULE BY MOUTH THREE TIMES A DAY 270 capsule 3  . glucose blood (CONTOUR NEXT TEST) test strip 1 each by Other route  4 (four) times daily. And lancets 4/day 400 each 3  . HYDROcodone-acetaminophen (NORCO/VICODIN) 5-325 MG tablet Take 1 tablet by mouth every 6 (six) hours as needed. 15 tablet 0  . insulin glargine (LANTUS SOLOSTAR) 100 UNIT/ML Solostar Pen Inject 30 Units into the skin every morning. 18 mL 2  . insulin lispro (HUMALOG KWIKPEN) 100 UNIT/ML KwikPen Inject 0.15 mLs (15 Units total) into the skin daily with supper. 15 mL 29  . Insulin Pen Needle (PEN NEEDLES) 32G X 4 MM MISC  1 each by Does not apply route 4 (four) times daily. E11.9 400 each 0  . isosorbide mononitrate (IMDUR) 30 MG 24 hr tablet TAKE 1 TABLET BY MOUTH AT BEDTIME. 90 tablet 0  . lactulose (CHRONULAC) 10 GM/15ML solution TAKE 15 MLS (10 G TOTAL) BY MOUTH DAILY AS NEEDED (CONSTIPATION). 473 mL 1  . LINZESS 290 MCG CAPS capsule TAKE 1 CAPSULE (290 MCG TOTAL) BY MOUTH DAILY BEFORE BREAKFAST. (Patient taking differently: as needed. ) 30 capsule 3  . LORazepam (ATIVAN) 0.5 MG tablet Take 0.5 mg by mouth every 12 (twelve) hours as needed for anxiety.    . metFORMIN (GLUCOPHAGE) 1000 MG tablet TAKE 1 TABLET BY MOUTH 2 TIMES DAILY WITH A MEAL. MUST CALL TO SCHEDULE AN APPT. 180 tablet 1  . Microlet Lancets MISC 1 each by Does not apply route 2 (two) times daily. E11.9 100 each 2  . nitroGLYCERIN (NITROSTAT) 0.4 MG SL tablet Place 1 tablet (0.4 mg total) under the tongue every 5 (five) minutes as needed for chest pain. Reported on 01/05/2016 25 tablet 6  . ondansetron (ZOFRAN-ODT) 4 MG disintegrating tablet Take 1 tablet (4 mg total) by mouth every 8 (eight) hours as needed for nausea or vomiting. 50 tablet 2  . telmisartan (MICARDIS) 20 MG tablet TAKE 1 TABLET BY MOUTH EVERY DAY 90 tablet 1  . tiZANidine (ZANAFLEX) 4 MG tablet Take 0.5-1 tablets (2-4 mg total) by mouth every 8 (eight) hours as needed for muscle spasms. 30 tablet 1  . venlafaxine XR (EFFEXOR-XR) 75 MG 24 hr capsule TAKE 1 CAPSULE BY MOUTH TWICE A DAY 180 capsule 3  . KLOR-CON M20  20 MEQ tablet TAKE 1 & 1/2 TABLETS BY MOUTH TWICE A DAY (Patient not taking: Reported on 08/21/2020) 270 tablet 2  . predniSONE (DELTASONE) 5 MG tablet Take 5 mg by mouth daily with breakfast. (Patient not taking: Reported on 08/21/2020)    . Sodium Sulfate-Mag Sulfate-KCl (SUTAB) [phone] MG TABS Take 1 kit by mouth as directed. MANUFACTURER CODES!! BIN: [id number] PCN: CN GROUP: [id number] MEMBER ID: [id number];KZL AS CASH;NO PRIOR AUTHORIZATION (Patient not taking: Reported on 08/21/2020) 24 tablet 0   No current facility-administered medications for this visit.     Objective:  BP 132/74   Pulse 81   Temp 98 F (36.7 C) (Temporal)   Resp 18   Ht 5' 6"  (1.676 m)   SpO2 99%   BMI 37.45 kg/m  Gen: NAD, resting comfortably CV: RRR no murmurs rubs or gallops Lungs: CTAB no crackles, wheeze, rhonchi Ext: no edema Skin: warm, dry, multiple wounds on left leg appear to be healing well.     Assessment and Plan  # Hospital Follow Up  S: Patient was admitted to [facility] for closed fracture of distal end of left femur and patient already with left knee arthroplasty which occurred after a fall coming down a staircase-she missed the last step and ended up landing on her left leg.  She had severe pain immediately afterwards.  X-ray showed acute comminuted distal femoral periprosthetic fracture with displacement and angulation.  On 08/08/2020 patient had open reduction internal fixation of left extra-articular periprosthetic distal femur fracture with synthes femur plate with cortical and locking screws.  This was thought to be due to pathologic fracture due to osteoporosis.  Interestingly enough-bone density was normal when checked November 01, 2019 so I would disagree with pathological fracture. She was started on calcium and vitamin D.  Plan  was for discharge to home for home health-we have been signing for home health orders-this visit will serve as face-to-face for  those orders.   Patient is tolerating tylenol most of days, narcotic pain medication before bed - mostly once a day, sometimes every 12 hours  Patient mentioned that seems to be doing well since being home from the hospital overall.  Outpatient follow-up with Dr. Nolon Stalls planned. They will remove sutures at that time.   A/P: 59 year old female with recent femur fracture thought to be related to osteoporosis but patient with normal bone density within a year-I would disagree with this being related osteoporosis (though there could be additional information during hospitalization I was unable to see through discharge summary which would change this opinion) though her prednisone certainly places her at risk for this.  Prednisone was stopped during hospitalization but I would like for patient to talk with her pulmonologist at [facility] about this-potentially orthopedist and pulmonologist will need to consult each other-I did not restart medication at this time.  Wounds appear to be healing well.  Patient seems to be progressing and has orthopedic follow-up  # Anemia after surgery S:Hemoglobin was as low as 6.8. did transfusion during surgery and then another transfusion day afterwards and levels got up to 8- discharged next day.  On discharge hgb was 8.1.  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 Noted - trend cbc. Has seen oncology in past for positive SPEP but ultimately thought related to sarcoid   She was on iron 3x a day right after hospitalization  Fecal occult blood negative A/P: Patient with anemia after surgery with hemoglobin down to low eights from baseline above 10-some of this likely related to surgery. -Change iron to 3x a week from 3 times a day-evidence I have seen suggest better absorption with this interval -Check CBC today-consider iron studies next visit   Chronic medical problems were stable throughout hospitalization.  See discussion below   #  hypertension/hypertensive heart disease with chronic diastolic CHF/CKD stage III S: compliant with Lasix 40 mg twice a day, diltiazem extended release 120 mg, carvedilol 12.5 mg BID decreased from 25 mg at hospital, telmisartan 20mg  daily, imdur 30mg   Renal function stable with GFR of 50s recently A/P: Hypertension/hypertensive heart disease with CHF appears well-controlled.  I did want her to restart her potassium with continued Lasix use.  Update CMP with labs today  #Sarcoidosis of lung-follows at [facility] and is on long term prednisone/Obstructive chronic bronchitis without exacerbation /Severe persistent asthma without complication S:  patient compliant with breo 200-25mg  1 puff every day.  patient compliant with breo 200-25mg  1 puff every day.  Patient also remains on chronic prednisone through [facility] pulmonology prior to hospitalization A/P: Appears controlled today-have been stopped on prednisone and encouraged her to follow-up with [facility] pulmonology about this  #Type 2 diabetes mellitus/morbid obesity-A1c trending up with hospitalization.  Will need close follow-up with endocrinology  Lab Results  Component Value Date   HGBA1C 7.9 08/08/2020   HGBA1C 6.7 (A) 04/30/2020   HGBA1C 7.0 (A) 02/07/2020   Recommended follow up: Keep February follow-up unless she needs [place] sooner Future Appointments  Date Time Provider [place]  11/27/2020  1:40 PM [name], MD [facility]    Lab/Order associations:   ICD-10-CM   1. Hypertensive heart disease with chronic diastolic congestive heart failure (HCC)  I11.0 CBC With Differential/Platelet   J47.82 COMPLETE METABOLIC PANEL WITH GFR    CBC  With Differential/Platelet    COMPLETE METABOLIC PANEL WITH GFR  2. Closed fracture of left femur, unspecified fracture morphology, unspecified portion of femur, initial encounter ([place])  S72.92XA   3. Anemia, unspecified type  D64.9 CBC With Differential/Platelet    CBC With Differential/Platelet   4. Sarcoidosis of lung ([place])  D86.0   5. Hypertension associated with diabetes ([place])  E11.59    I15.2   6. Hyperlipidemia associated with type 2 diabetes mellitus ([place])  E11.69    E78.5    Return precautions advised.  Garret Reddish, MD

## 2020-08-21 ENCOUNTER — Ambulatory Visit (INDEPENDENT_AMBULATORY_CARE_PROVIDER_SITE_OTHER): Payer: 59 | Admitting: Family Medicine

## 2020-08-21 ENCOUNTER — Other Ambulatory Visit: Payer: Self-pay

## 2020-08-21 ENCOUNTER — Encounter: Payer: Self-pay | Admitting: Family Medicine

## 2020-08-21 VITALS — BP 132/74 | HR 81 | Temp 98.0°F | Resp 18 | Ht 66.0 in

## 2020-08-21 DIAGNOSIS — D860 Sarcoidosis of lung: Secondary | ICD-10-CM | POA: Diagnosis not present

## 2020-08-21 DIAGNOSIS — I110 Hypertensive heart disease with heart failure: Secondary | ICD-10-CM

## 2020-08-21 DIAGNOSIS — D649 Anemia, unspecified: Secondary | ICD-10-CM

## 2020-08-21 DIAGNOSIS — I152 Hypertension secondary to endocrine disorders: Secondary | ICD-10-CM

## 2020-08-21 DIAGNOSIS — E1159 Type 2 diabetes mellitus with other circulatory complications: Secondary | ICD-10-CM

## 2020-08-21 DIAGNOSIS — E1169 Type 2 diabetes mellitus with other specified complication: Secondary | ICD-10-CM

## 2020-08-21 DIAGNOSIS — S7292XA Unspecified fracture of left femur, initial encounter for closed fracture: Secondary | ICD-10-CM

## 2020-08-21 DIAGNOSIS — E785 Hyperlipidemia, unspecified: Secondary | ICD-10-CM

## 2020-08-21 DIAGNOSIS — I5032 Chronic diastolic (congestive) heart failure: Secondary | ICD-10-CM

## 2020-08-21 NOTE — Patient Instructions (Addendum)
Health Maintenance Due  Topic Date Due  . COLONOSCOPY Has not been completed due to the patient's recent surgery. 04/29/2020   Change iron to 3x a week (better absorption with less frequent dosing)  Continue potassium  I would call [facility] and see what they recommend about prednisone. I strongly defer to them.   You did not have osteoporosis in January with all scores -0.8 or better. I would share this with Dr. Kary Kos. She may be ok with prednisone at a later date knowing this. She and [facility] pulmonology may need to chat about prednisone decision.   Continue lower dose carvedilol 12.5mg  twice dialy  Please stop by lab before you go If you have mychart- we will send your results within 3 business days of [place] receiving them.  If you do not have mychart- we will call you about results within 5 business days of [place] receiving them.  *please note we are currently using [facility] which has a longer processing time than [place] typically so labs may not come back as quickly as in the past *please also note that you will see labs on mychart as soon as they post. I will later go in and write notes on them- will say "notes from Dr. Yong Channel"    Recommended follow up: No follow-ups on file.

## 2020-08-22 ENCOUNTER — Telehealth: Payer: Self-pay

## 2020-08-22 DIAGNOSIS — D649 Anemia, unspecified: Secondary | ICD-10-CM

## 2020-08-22 LAB — CBC WITH DIFFERENTIAL/PLATELET
Absolute Monocytes: 1030 cells/uL — ABNORMAL HIGH (ref 200–950)
Basophils Absolute: 58 cells/uL (ref 0–200)
Basophils Relative: 0.4 %
Eosinophils Absolute: 348 cells/uL (ref 15–500)
Eosinophils Relative: 2.4 %
HCT: 28.8 % — ABNORMAL LOW (ref 35.0–45.0)
Hemoglobin: 8.7 g/dL — ABNORMAL LOW (ref 11.7–15.5)
Lymphs Abs: 2349 cells/uL (ref 850–3900)
MCH: 27.2 pg (ref 27.0–33.0)
MCHC: 30.2 g/dL — ABNORMAL LOW (ref 32.0–36.0)
MCV: 90 fL (ref 80.0–100.0)
MPV: 9 fL (ref 7.5–12.5)
Monocytes Relative: 7.1 %
Neutro Abs: 10716 cells/uL — ABNORMAL HIGH (ref 1500–7800)
Neutrophils Relative %: 73.9 %
Platelets: 648 10*3/uL — ABNORMAL HIGH (ref 140–400)
RBC: 3.2 10*6/uL — ABNORMAL LOW (ref 3.80–5.10)
RDW: 14.3 % (ref 11.0–15.0)
Total Lymphocyte: 16.2 %
WBC: 14.5 10*3/uL — ABNORMAL HIGH (ref 3.8–10.8)

## 2020-08-22 LAB — COMPLETE METABOLIC PANEL WITH GFR
AG Ratio: 1.5 (calc) (ref 1.0–2.5)
ALT: 14 U/L (ref 6–29)
AST: 19 U/L (ref 10–35)
Albumin: 3.7 g/dL (ref 3.6–5.1)
Alkaline phosphatase (APISO): 203 U/L — ABNORMAL HIGH (ref 37–153)
BUN: 15 mg/dL (ref 7–25)
CO2: 28 mmol/L (ref 20–32)
Calcium: 9 mg/dL (ref 8.6–10.4)
Chloride: 99 mmol/L (ref 98–110)
Creat: 0.88 mg/dL (ref 0.50–0.99)
GFR, Est African American: 82 mL/min/{1.73_m2} (ref >=60)
GFR, Est Non African American: 71 mL/min/{1.73_m2} (ref >=60)
Globulin: 2.4 g/dL (calc) (ref 1.9–3.7)
Glucose, Bld: 148 mg/dL — ABNORMAL HIGH (ref 65–99)
Potassium: 4.3 mmol/L (ref 3.5–5.3)
Sodium: 138 mmol/L (ref 135–146)
Total Bilirubin: 0.8 mg/dL (ref 0.2–1.2)
Total Protein: 6.1 g/dL (ref 6.1–8.1)

## 2020-08-22 NOTE — Telephone Encounter (Signed)
Patient is exhibiting signs of positional hypotension BP dropping when she stands up and patient is getting dizzy  Laying down 130/60 standing up 90/60

## 2020-08-22 NOTE — Telephone Encounter (Signed)
Have her take lasix once a day for 3 days and then restart twice daily. If still having issues I may decrease her telmisartan or stop it

## 2020-08-25 NOTE — Telephone Encounter (Signed)
Called and spoke with pt and lab visit scheduled.

## 2020-08-25 NOTE — Telephone Encounter (Signed)
Called and gave pt below information. She wanted to know if you are concerned about her hemoglobin since her surgery, she sates she thinks it was an 8 something and that it keeps dropping. Does she need to come in for a lab visit?

## 2020-08-25 NOTE — Telephone Encounter (Signed)
It was actually better than the last check in the hospital and was up to 8.7.  I think it be reasonable to recheck in a week under anemia unspecified-CBC with differential

## 2020-08-25 NOTE — Addendum Note (Signed)
Addended by: Clyde Lundborg A on: 08/25/2020 12:40 PM   Modules accepted: Orders

## 2020-08-27 ENCOUNTER — Other Ambulatory Visit: Payer: Self-pay | Admitting: Cardiology

## 2020-08-28 ENCOUNTER — Other Ambulatory Visit: Payer: Self-pay | Admitting: Family Medicine

## 2020-08-29 ENCOUNTER — Ambulatory Visit: Payer: 59 | Admitting: Cardiology

## 2020-08-29 DIAGNOSIS — T8454XA Infection and inflammatory reaction due to internal left knee prosthesis, initial encounter: Secondary | ICD-10-CM | POA: Diagnosis present

## 2020-08-29 DIAGNOSIS — Z9049 Acquired absence of other specified parts of digestive tract: Secondary | ICD-10-CM | POA: Diagnosis not present

## 2020-08-29 DIAGNOSIS — E785 Hyperlipidemia, unspecified: Secondary | ICD-10-CM | POA: Diagnosis present

## 2020-08-29 DIAGNOSIS — Z794 Long term (current) use of insulin: Secondary | ICD-10-CM | POA: Diagnosis not present

## 2020-08-29 DIAGNOSIS — Z833 Family history of diabetes mellitus: Secondary | ICD-10-CM | POA: Diagnosis not present

## 2020-08-29 DIAGNOSIS — F329 Major depressive disorder, single episode, unspecified: Secondary | ICD-10-CM | POA: Diagnosis present

## 2020-08-29 DIAGNOSIS — I130 Hypertensive heart and chronic kidney disease with heart failure and stage 1 through stage 4 chronic kidney disease, or unspecified chronic kidney disease: Secondary | ICD-10-CM | POA: Diagnosis present

## 2020-08-29 DIAGNOSIS — Z8249 Family history of ischemic heart disease and other diseases of the circulatory system: Secondary | ICD-10-CM | POA: Diagnosis not present

## 2020-08-29 DIAGNOSIS — E1165 Type 2 diabetes mellitus with hyperglycemia: Secondary | ICD-10-CM | POA: Diagnosis present

## 2020-08-29 DIAGNOSIS — D62 Acute posthemorrhagic anemia: Secondary | ICD-10-CM | POA: Diagnosis not present

## 2020-08-29 DIAGNOSIS — M810 Age-related osteoporosis without current pathological fracture: Secondary | ICD-10-CM | POA: Diagnosis present

## 2020-08-29 DIAGNOSIS — Z881 Allergy status to other antibiotic agents status: Secondary | ICD-10-CM | POA: Diagnosis not present

## 2020-08-29 DIAGNOSIS — Z8616 Personal history of COVID-19: Secondary | ICD-10-CM | POA: Diagnosis not present

## 2020-08-29 DIAGNOSIS — B9561 Methicillin susceptible Staphylococcus aureus infection as the cause of diseases classified elsewhere: Secondary | ICD-10-CM | POA: Diagnosis present

## 2020-08-29 DIAGNOSIS — Z7982 Long term (current) use of aspirin: Secondary | ICD-10-CM | POA: Diagnosis not present

## 2020-08-29 DIAGNOSIS — D869 Sarcoidosis, unspecified: Secondary | ICD-10-CM | POA: Diagnosis present

## 2020-08-29 DIAGNOSIS — Z79899 Other long term (current) drug therapy: Secondary | ICD-10-CM | POA: Diagnosis not present

## 2020-08-29 DIAGNOSIS — S72401A Unspecified fracture of lower end of right femur, initial encounter for closed fracture: Secondary | ICD-10-CM | POA: Diagnosis present

## 2020-08-29 DIAGNOSIS — J449 Chronic obstructive pulmonary disease, unspecified: Secondary | ICD-10-CM | POA: Diagnosis present

## 2020-08-29 DIAGNOSIS — M9712XA Periprosthetic fracture around internal prosthetic left knee joint, initial encounter: Secondary | ICD-10-CM | POA: Diagnosis present

## 2020-08-29 DIAGNOSIS — E1122 Type 2 diabetes mellitus with diabetic chronic kidney disease: Secondary | ICD-10-CM | POA: Diagnosis present

## 2020-08-29 DIAGNOSIS — Z90710 Acquired absence of both cervix and uterus: Secondary | ICD-10-CM | POA: Diagnosis not present

## 2020-08-29 DIAGNOSIS — N183 Chronic kidney disease, stage 3 unspecified: Secondary | ICD-10-CM | POA: Diagnosis present

## 2020-08-29 DIAGNOSIS — I5032 Chronic diastolic (congestive) heart failure: Secondary | ICD-10-CM | POA: Diagnosis present

## 2020-08-29 DIAGNOSIS — Z981 Arthrodesis status: Secondary | ICD-10-CM | POA: Diagnosis not present

## 2020-08-29 HISTORY — PX: ORIF FEMUR W/ PERI-IMPLANT: SUR967

## 2020-09-01 ENCOUNTER — Other Ambulatory Visit: Payer: Self-pay

## 2020-09-06 ENCOUNTER — Other Ambulatory Visit: Payer: Self-pay | Admitting: Family Medicine

## 2020-09-10 ENCOUNTER — Telehealth: Payer: Self-pay

## 2020-09-10 LAB — COMPREHENSIVE METABOLIC PANEL
Albumin: 3.2 — AB (ref 3.5–5.0)
Calcium: 8.5 — AB (ref 8.7–10.7)
GFR calc Af Amer: 96
GFR calc non Af Amer: 84
Globulin: 2.3

## 2020-09-10 LAB — BASIC METABOLIC PANEL
BUN: 13 (ref 4–21)
CO2: 27 — AB (ref 13–22)
Chloride: 96 — AB (ref 99–108)
Creatinine: 0.8 (ref 0.5–1.1)
Glucose: 181
Potassium: 4.6 (ref 3.4–5.3)
Sodium: 136 — AB (ref 137–147)

## 2020-09-10 LAB — CBC AND DIFFERENTIAL
HCT: 23 — AB (ref 36–46)
Hemoglobin: 7.3 — AB (ref 12.0–16.0)
Neutrophils Absolute: 5.3
Platelets: 310 (ref 150–399)
WBC: 8.6

## 2020-09-10 LAB — CBC: RBC: 2.66 — AB (ref 3.87–5.11)

## 2020-09-10 LAB — HEPATIC FUNCTION PANEL
ALT: 9 (ref 7–35)
AST: 34 (ref 13–35)
Alkaline Phosphatase: 302 — AB (ref 25–125)
Bilirubin, Total: 0.8

## 2020-09-10 NOTE — Telephone Encounter (Signed)
..  Home Health Verbal Orders   Caller:jerilynn     Requesting OT/ PT/ Skilled nursing/ Social Work/ Speech:  PT  Reason for Request:  continue  Frequency:  1x for 5 weeks

## 2020-09-10 NOTE — Telephone Encounter (Signed)
Called and provided VO to [place].

## 2020-09-11 ENCOUNTER — Telehealth: Payer: Self-pay | Admitting: Endocrinology

## 2020-09-11 ENCOUNTER — Other Ambulatory Visit: Payer: Self-pay

## 2020-09-11 DIAGNOSIS — E1129 Type 2 diabetes mellitus with other diabetic kidney complication: Secondary | ICD-10-CM

## 2020-09-11 DIAGNOSIS — Z794 Long term (current) use of insulin: Secondary | ICD-10-CM

## 2020-09-11 MED ORDER — LANTUS SOLOSTAR 100 UNIT/ML ~~LOC~~ SOPN: 30.0000 [IU] | PEN_INJECTOR | SUBCUTANEOUS | 2 refills | Status: DC

## 2020-09-11 NOTE — Telephone Encounter (Signed)
Patient's husband called due to patient being out of medication and PHARM told him that they have not received a RX for the med listed below since 2020. Requests a new RX with refills for insulin glargine (LANTUS SOLOSTAR) 100 UNIT/ML Solostar Pen be sent asap to  [name], [place] Phone:  [phone]  Fax:  [phone]

## 2020-09-11 NOTE — Telephone Encounter (Signed)
RX sent to pharmacy  

## 2020-09-12 ENCOUNTER — Other Ambulatory Visit: Payer: Self-pay

## 2020-09-12 ENCOUNTER — Encounter: Payer: Self-pay | Admitting: Endocrinology

## 2020-09-12 ENCOUNTER — Ambulatory Visit (INDEPENDENT_AMBULATORY_CARE_PROVIDER_SITE_OTHER): Payer: 59 | Admitting: Endocrinology

## 2020-09-12 DIAGNOSIS — Z794 Long term (current) use of insulin: Secondary | ICD-10-CM | POA: Diagnosis not present

## 2020-09-12 DIAGNOSIS — E1129 Type 2 diabetes mellitus with other diabetic kidney complication: Secondary | ICD-10-CM

## 2020-09-12 LAB — POCT GLYCOSYLATED HEMOGLOBIN (HGB A1C): Hemoglobin A1C: 6 % — AB (ref 4.0–5.6)

## 2020-09-12 MED ORDER — LANTUS SOLOSTAR 100 UNIT/ML ~~LOC~~ SOPN: 25.0000 [IU] | PEN_INJECTOR | SUBCUTANEOUS | 2 refills | Status: DC

## 2020-09-12 MED ORDER — METFORMIN HCL 1000 MG PO TABS: 1000.0000 mg | ORAL_TABLET | Freq: Two times a day (BID) | ORAL | 1 refills | Status: DC

## 2020-09-12 NOTE — Progress Notes (Signed)
Subjective:    Patient ID: [name], female    DOB: 08-10-1961, 59 y.o.   MRN: [id number]  HPI Pt returns for f/u of diabetes mellitus:  DM type: Insulin-requiring type 2 Dx'ed: 9741 Complications: PN and mild CAD.  Therapy: insulin since 6384, Trulicity, and metformin.  GDM: never DKA: never Severe hypoglycemia: never Pancreatitis: never Pancreatic imaging: CT (2015) showed fatty atrophy.  Other: she takes 2 QD insulins, after poor results with multiple daily injections; she intermittently takes prednisone for sarcoidosis or AB.  Interval history: no prednisone since 11/31  She takes meds as rx'ed.  cbg varies from 119-208.  pt states she feels well in general.   Past Medical History:  Diagnosis Date  . Abnormal SPEP 04/17/2014  . Acute left ankle pain 01/26/2017  . ANEMIA-UNSPECIFIED 09/18/2009  . Anxiety   . Arthritis   . CHF (congestive heart failure) ([place])   . Chronic diastolic heart failure, NYHA class 2 (HCC)    Normal LVEDP by May 2018  . COPD (chronic obstructive pulmonary disease) ([place])   . Depression   . DIABETES MELLITUS, TYPE II 08/21/2006  . Diabetic osteomyelitis ([place]) 05/29/2015  . Fatty liver   . Fracture of 5th metatarsal    non union  . GERD 53/64/6803   Had fundoplication  . GOUT 08/20/2010  . Hammer toe of right foot    2-5th toes  . Hx of umbilical hernia repair   . HYPERLIPIDEMIA 08/21/2006  . HYPERTENSION 08/21/2006  . Infection of wound due to methicillin resistant Staphylococcus aureus (MRSA)   . Internal hemorrhoids   . Kidney problem   . Multiple allergies 10/14/2016  . OBESITY 06/04/2009  . Onychomycosis 10/27/2015  . Osteomyelitis of left foot ([place]) 05/29/2015  . Pulmonary sarcoidosis ([place])    Followed locally by pulmonology, but also by Dr. Casper Harrison at [facility]  . Right knee pain 01/26/2017  . SOB (shortness of breath)   . Vocal cord dysfunction   . Wears partial dentures     Past Surgical History:  Procedure  Laterality Date  . ABDOMINAL HYSTERECTOMY    . APPENDECTOMY    . BLADDER SUSPENSION  11/11/2011   Procedure: TRANSVAGINAL TAPE (TVT) PROCEDURE;  Surgeon: Olga Millers, MD;  Location: [facility];  Service: Gynecology;  Laterality: N/A;  . CAROTIDS  02/18/11   CAROTID DUPLEX; VERTEBRALS ARE PATENT WITH ANTEGRADE FLOW. ICA/CCA RATIO 1.61 ON RIGHT AND 0.75 ON LEFT  . CHOLECYSTECTOMY  1984  . COLONOSCOPY  04/29/2010   [name]  . CYSTOSCOPY  11/11/2011   Procedure: CYSTOSCOPY;  Surgeon: Olga Millers, MD;  Location: [facility];  Service: Gynecology;  Laterality: N/A;  . EXTUBATION (ENDOTRACHEAL) IN OR N/A 09/23/2017   Procedure: EXTUBATION (ENDOTRACHEAL) IN OR;  Surgeon: Helayne Seminole, MD;  Location: [place];  Service: ENT;  Laterality: N/A;  . FIBEROPTIC LARYNGOSCOPY AND TRACHEOSCOPY N/A 09/23/2017   Procedure: FLEXIBLE FIBEROPTIC LARYNGOSCOPY;  Surgeon: Helayne Seminole, MD;  Location: [place];  Service: ENT;  Laterality: N/A;  . FRACTURE SURGERY     foot  . HALLUX FUSION Left 10/02/2018   Procedure: HALLUX ARTHRODESIS;  Surgeon: Edrick Kins, DPM;  Location: [facility];  Service: Podiatry;  Laterality: Left;  . HAMMER TOE SURGERY Left 10/02/2018   Procedure: HAMMER TOE CORRECTION 2ND 3RD 4RD FIFTH TOE;  Surgeon: Edrick Kins, DPM;  Location: [facility];  Service: Podiatry;  Laterality: Left;  . HAMMER TOE SURGERY Right 04/12/2019  Procedure: HAMMER TOE CORRECTION, 2ND, 3RD, 4TH AND 5TH TOES OF RIGHT FOOT;  Surgeon: Edrick Kins, DPM;  Location: [place];  Service: Podiatry;  Laterality: Right;  . HAMMER TOE SURGERY Left 10/25/2019   Procedure: HAMMER TOE REPAIR SECOND, THIRD, FOUTH;  Surgeon: Edrick Kins, DPM;  Location: [facility];  Service: Podiatry;  Laterality: Left;  . HERNIA REPAIR    . I & D EXTREMITY Left 06/27/2015   Procedure: Partial Excision Left Calcaneus, Place Antibiotic Beads, and Wound VAC;  Surgeon: Newt Minion, MD;  Location: [place];  Service: Orthopedics;  Laterality: Left;  .  JOINT REPLACEMENT    . KNEE ARTHROSCOPY     right  . LEFT AND RIGHT HEART CATHETERIZATION WITH CORONARY ANGIOGRAM N/A 04/23/2013   Procedure: LEFT AND RIGHT HEART CATHETERIZATION WITH CORONARY ANGIOGRAM;  Surgeon: Leonie Man, MD;  Location: [facility];  Service: Cardiovascular;  Laterality: N/A;  . LEFT HEART CATH AND CORONARY ANGIOGRAPHY N/A 03/11/2017   Procedure: Left Heart Cath and Coronary Angiography;  Surgeon: Sherren Mocha, MD;  Location: [place] [facility]; angiographically minimal CAD in the LAD otherwise normal.;  Normal LVEDP.  FALSE POSITIVE MYOVIEW  . LEFT HEART CATH AND CORONARY ANGIOGRAPHY  07/20/2010   LVEF 50-55% WITH VERY MILD GLOBAL HYPOKINESIA; ESSENTIALLY NORMAL CORONARY ARTERIES; NORMAL LV FUNCTION  . METATARSAL OSTEOTOMY WITH OPEN REDUCTION INTERNAL FIXATION (ORIF) METATARSAL WITH FUSION Left 04/09/2014   Procedure: LEFT FOOT FRACTURE OPEN TREATMENT METATARSAL INCLUDES INTERNAL FIXATION EACH;  Surgeon: Lorn Junes, MD;  Location: [place];  Service: Orthopedics;  Laterality: Left;  . NISSEN FUNDOPLICATION  0347  . NM MYOVIEW LTD  03/09/2013   Lexiscan --> EF 50%; NORMAL MYOCARDIAL PERFUSION STUDY - breast attenuation  . NM MYOVIEW LTD  03/10/2017   : Moderate size "stress-induced "perfusion defect at the apex as well as "ill-defined stress-induced perfusion defect in the lateral wall.  EF 55%.  INTERMEDIATE risk. -->  FALSE POSITIVE  . Right and left CARDIAC CATHETERIZATION  04/23/2013   Angiographic normal coronaries; LVEDP 20 mmHg, PCWP 12-14 mmHg, RAP 12 mmHg.; Fick CO/CI 4.9/2.2  . SHOULDER ARTHROSCOPY Left 03/14/2019   Procedure: LEFT SHOULDER ARTHROSCOPY, DEBRIDEMENT, AND DECOMPRESSION;  Surgeon: Newt Minion, MD;  Location: [place];  Service: Orthopedics;  Laterality: Left;  . TOTAL KNEE ARTHROPLASTY Right 06/29/2017   Procedure: RIGHT TOTAL KNEE ARTHROPLASTY;  Surgeon: Newt Minion, MD;  Location: [place];  Service: Orthopedics;  Laterality:  Right;  . TOTAL KNEE ARTHROPLASTY Left 12/07/2017  . TOTAL KNEE ARTHROPLASTY Left 12/07/2017   Procedure: LEFT TOTAL KNEE ARTHROPLASTY;  Surgeon: Newt Minion, MD;  Location: [place];  Service: Orthopedics;  Laterality: Left;  . TRACHEOSTOMY TUBE PLACEMENT N/A 09/20/2017   Procedure: AWAKE INTUBATION WITH ANESTHESIA WITH VIDEO ASSISTANCE;  Surgeon: Helayne Seminole, MD;  Location: [facility] [facility];  Service: ENT;  Laterality: N/A;  . TRANSTHORACIC ECHOCARDIOGRAM  08/2014   Normal LV size and function.  Mild LVH.  EF 55-60%.  Normal regional wall motion.  GR 1 DD.  Normal RV size and function .  [name] TUBAL LIGATION     with reversal in 1994  . VENTRAL HERNIA REPAIR      Social History   Socioeconomic History  . Marital status: Married    Spouse name: SHANTIL VALLEJO  . Number of children: 2  . Years of education: 89  . Highest education level: Not on file  Occupational History  .  Occupation: DISABLED  Tobacco Use  . Smoking status: Never Smoker  . Smokeless tobacco: Never Used  Vaping Use  . Vaping Use: Never used  Substance and Sexual Activity  . Alcohol use: No    Alcohol/week: 0.0 standard drinks  . Drug use: No  . Sexual activity: Yes    Birth control/protection: Surgical  Other Topics Concern  . Not on file  Social History Narrative   Married 1994. 2 sons who both live close and 1 grandson.       Disability due to sarcoidosis. 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 Difficulty of Paying Living Expenses: Not on file  Food Insecurity:   . Worried About Charity fundraiser in the Last Year: Not on file  . Ran Out of Food in the Last Year: Not on file  Transportation Needs:   . Lack of Transportation (Medical): Not on file  . Lack of Transportation (Non-Medical): Not on  file  Physical Activity:   . Days of Exercise per Week: Not on file  . Minutes of Exercise per Session: Not on file  Stress:   . Feeling of Stress : Not on file  Social Connections:   . Frequency of Communication with Friends and Family: Not on file  . Frequency of Social Gatherings with Friends and Family: Not on file  . Attends Religious Services: Not on file  . Active Member of Clubs or Organizations: Not on file  . Attends Archivist Meetings: Not on file  . Marital Status: Not on file  Intimate Partner Violence:   . Fear of Current or Ex-Partner: Not on file  . Emotionally Abused: Not on file  . Physically Abused: Not on file  . Sexually Abused: Not on file    Current Outpatient Medications on File Prior to Visit  Medication Sig Dispense Refill  . albuterol (PROVENTIL HFA;VENTOLIN HFA) 108 (90 Base) MCG/ACT inhaler Inhale 1-2 puffs into the lungs every 6 (six) hours as needed for wheezing or shortness of breath. 8 g 5  . allopurinol (ZYLOPRIM) 100 MG tablet Take 100 mg by mouth every evening.     [name] aspirin EC 81 MG tablet Take 162 mg by mouth daily.     [name] atorvastatin (LIPITOR) 40 MG tablet TAKE 1 TABLET BY MOUTH EVERY DAY 90 tablet 3  . BREO ELLIPTA 200-25 MCG/INH AEPB TAKE 1 PUFF BY MOUTH EVERY DAY 60 each 5  . buPROPion (WELLBUTRIN XL) 300 MG 24 hr tablet TAKE 1 TABLET BY MOUTH EVERY DAY 90 tablet 3  . calcium carbonate (OS-CAL - DOSED IN MG OF ELEMENTAL CALCIUM) 1250 (500 Ca) MG tablet Take 1 tablet by mouth daily.    . carvedilol (COREG) 12.5 MG tablet Take 12.5 mg by mouth 2 (two) times daily with a meal.    . chlorpheniramine-HYDROcodone (TUSSIONEX PENNKINETIC ER) 10-8 MG/5ML SUER Take 5 mLs by mouth 2 (two) times daily. (Patient taking differently: Take 5 mLs by mouth as needed. ) 140 mL 0  . Cholecalciferol 25 MCG (1000 UT) capsule Take 1,000 Units by mouth in the morning and at bedtime.    [name] DEXILANT 60 MG capsule TAKE 1 CAPSULE BY MOUTH EVERY DAY 90 capsule 3   . diclofenac Sodium (VOLTAREN) 1 %  GEL Apply 1 application topically as needed (pain.).     [name] diltiazem (CARDIZEM CD) 120 MG 24 hr capsule TAKE 1 CAPSULE BY MOUTH EVERY DAY 90 capsule 0  . Dulaglutide (TRULICITY) 3 XI/5.0TU SOPN Inject 0.5 mLs (3 mg total) into the skin once a week. 12 pen 3  . enoxaparin (LOVENOX) 40 MG/0.4ML injection Inject into the skin.    . famotidine (PEPCID) 20 MG tablet Take 1 tablet (20 mg total) by mouth at bedtime. 30 tablet 3  . fenofibrate 160 MG tablet TAKE 1 TABLET BY MOUTH EVERY DAY 30 tablet 0  . ferrous gluconate (FERGON) 324 MG tablet Take 324 mg by mouth in the morning, at noon, and at bedtime.    . fluticasone (FLONASE) 50 MCG/ACT nasal spray Place 2 sprays into both nostrils daily as needed for allergies. 48 mL 3  . furosemide (LASIX) 40 MG tablet TAKE 1 TABLET BY MOUTH TWICE A DAY 180 tablet 1  . gabapentin (NEURONTIN) 100 MG capsule TAKE 1 CAPSULE BY MOUTH THREE TIMES A DAY 270 capsule 3  . glucose blood (CONTOUR NEXT TEST) test strip 1 each by Other route 4 (four) times daily. And lancets 4/day 400 each 3  . HYDROcodone-acetaminophen (NORCO/VICODIN) 5-325 MG tablet Take 1 tablet by mouth every 6 (six) hours as needed. 15 tablet 0  . insulin lispro (HUMALOG KWIKPEN) 100 UNIT/ML KwikPen Inject 0.15 mLs (15 Units total) into the skin daily with supper. 15 mL 29  . Insulin Pen Needle (PEN NEEDLES) 32G X 4 MM MISC 1 each by Does not apply route 4 (four) times daily. E11.9 400 each 0  . isosorbide mononitrate (IMDUR) 30 MG 24 hr tablet TAKE 1 TABLET BY MOUTH AT BEDTIME. 90 tablet 0  . KLOR-CON M20 20 MEQ tablet TAKE 1 & 1/2 TABLETS BY MOUTH TWICE A DAY 270 tablet 2  . lactulose (CHRONULAC) 10 GM/15ML solution TAKE 15 MLS (10 G TOTAL) BY MOUTH DAILY AS NEEDED (CONSTIPATION). 473 mL 1  . LINZESS 290 MCG CAPS capsule TAKE 1 CAPSULE (290 MCG TOTAL) BY MOUTH DAILY BEFORE BREAKFAST. (Patient taking differently: as needed. ) 30 capsule 3  . LORazepam (ATIVAN) 0.5 MG  tablet Take 0.5 mg by mouth every 12 (twelve) hours as needed for anxiety.    . Microlet Lancets MISC 1 each by Does not apply route 2 (two) times daily. E11.9 100 each 2  . nitroGLYCERIN (NITROSTAT) 0.4 MG SL tablet Place 1 tablet (0.4 mg total) under the tongue every 5 (five) minutes as needed for chest pain. Reported on 01/05/2016 25 tablet 6  . ondansetron (ZOFRAN-ODT) 4 MG disintegrating tablet Take 1 tablet (4 mg total) by mouth every 8 (eight) hours as needed for nausea or vomiting. 50 tablet 2  . rifampin (RIFADIN) 300 MG capsule Take by mouth.    . Sodium Sulfate-Mag Sulfate-KCl (SUTAB) [phone] MG TABS Take 1 kit by mouth as directed. MANUFACTURER CODES!! BIN: [id number] PCN: CN GROUP: [id number] MEMBER ID: [id number];MOL AS CASH;NO PRIOR AUTHORIZATION 24 tablet 0  . telmisartan (MICARDIS) 20 MG tablet TAKE 1 TABLET BY MOUTH EVERY DAY 90 tablet 1  . tiZANidine (ZANAFLEX) 4 MG tablet Take 0.5-1 tablets (2-4 mg total) by mouth every 8 (eight) hours as needed for muscle spasms. 30 tablet 1  . venlafaxine XR (EFFEXOR-XR) 75 MG 24 hr capsule TAKE 1 CAPSULE BY MOUTH TWICE A DAY 180 capsule 3  . Colchicine 0.6 MG CAPS Take 0.6 mg by mouth 2 (two) times daily as  needed for up to 4 days (may discontinue if symptoms improve). 20 capsule 1  . [DISCONTINUED] mupirocin nasal ointment (BACTROBAN) 2 % Place 1 application into the nose 2 (two) times daily. Use one-half of tube in each nostril twice daily for five (5) days. After application, press sides of nose together and gently massage. 10 g 0   No current facility-administered medications on file prior to visit.    Allergies  Allergen Reactions  . Methotrexate Other (See Comments)    Peri-oral and buccal lesions  . Vancomycin Other (See Comments)    DOSE RELATED NEPHROTOXICITY  . Lisinopril Cough  . Chlorhexidine Itching  . Clindamycin/Lincomycin Nausea And Vomiting and Rash  . Doxycycline Rash  . Teflaro [Ceftaroline] Rash and Other (See  Comments)    Tolerates ceftriaxone     Family History  Problem Relation Age of Onset  . Diabetes Father   . Heart attack Father 70  . Coronary artery disease Father   . Heart failure Father   . Throat cancer Father   . Hypertension Father   . Heart disease Father   . Sleep apnea Father   . Obesity Father   . COPD Mother   . Emphysema Mother   . Asthma Mother   . Heart failure Mother   . Breast cancer Mother   . Diabetes Mother   . Kidney disease Mother   . Thyroid disease Mother   . Heart attack Maternal Grandfather   . Sarcoidosis Maternal Uncle   . Lung cancer Brother   . Diabetes Brother   . Colon cancer Neg Hx   . Colon polyps Neg Hx   . Esophageal cancer Neg Hx   . Rectal cancer Neg Hx   . Stomach cancer Neg Hx     BP 122/60   Pulse 93   Ht 5' 6"  (1.676 m)   SpO2 96%   BMI 37.45 kg/m    Review of Systems Denies nausea.  She denies hypoglycemia.  She has lost weight, due to recent femoral fx.      Objective:   Physical Exam VITAL SIGNS:  See vs page GENERAL: no distress Pulses: dorsalis pedis intact bilat.   MSK: no deformity of the feet CV: trace bilat leg edema Skin:  no ulcer on the feet.  normal color and temp on the feet. Neuro: sensation is intact to touch on the feet, but decreased from normal  A1c=6.0%     Assessment & Plan:  Insulin-requiring type 2 DM, with CAD: overcontrolled.   Patient Instructions  I have sent a prescription to your pharmacy, to reduce the Lantus to 25 units each morning. Please continue the same other medications.  check your blood sugar 4 times a day: before the 3 meals, and at bedtime.  also check if you have symptoms of your blood sugar being too high or too low.  please keep a record of the readings and bring it to your next appointment here (or you can bring the meter itself).  You can write it on any piece of paper.  please call us sooner if your blood sugar goes below 70, or if you have a lot of readings over  200.   Please come back for a follow-up appointment in 2 months.

## 2020-09-12 NOTE — Patient Instructions (Addendum)
I have sent a prescription to your pharmacy, to reduce the Lantus to 25 units each morning. Please continue the same other medications.  check your blood sugar 4 times a day: before the 3 meals, and at bedtime.  also check if you have symptoms of your blood sugar being too high or too low.  please keep a record of the readings and bring it to your next appointment here (or you can bring the meter itself).  You can write it on any piece of paper.  please call us sooner if your blood sugar goes below 70, or if you have a lot of readings over 200.   Please come back for a follow-up appointment in 2 months.

## 2020-09-17 NOTE — Telephone Encounter (Signed)
No additional notes needed  

## 2020-09-22 ENCOUNTER — Telehealth: Payer: Self-pay

## 2020-09-22 ENCOUNTER — Encounter: Payer: Self-pay | Admitting: Family Medicine

## 2020-09-22 DIAGNOSIS — D649 Anemia, unspecified: Secondary | ICD-10-CM

## 2020-09-22 LAB — COMPREHENSIVE METABOLIC PANEL
Albumin: 3.4 — AB (ref 3.5–5.0)
Calcium: 8.7 (ref 8.7–10.7)
GFR calc Af Amer: 110
GFR calc non Af Amer: 95
Globulin: 2.3

## 2020-09-22 LAB — CBC AND DIFFERENTIAL
HCT: 23 — AB (ref 36–46)
Hemoglobin: 7.4 — AB (ref 12.0–16.0)
Neutrophils Absolute: 3.4
Platelets: 274 (ref 150–399)
WBC: 6.2

## 2020-09-22 LAB — BASIC METABOLIC PANEL
BUN: 9 (ref 4–21)
CO2: 24 — AB (ref 13–22)
Chloride: 100 (ref 99–108)
Creatinine: 0.7 (ref 0.5–1.1)
Glucose: 254
Potassium: 4.5 (ref 3.4–5.3)
Sodium: 135 — AB (ref 137–147)

## 2020-09-22 LAB — CBC: RBC: 2.61 — AB (ref 3.87–5.11)

## 2020-09-22 LAB — HEPATIC FUNCTION PANEL
ALT: 5 — AB (ref 7–35)
AST: 13 (ref 13–35)
Alkaline Phosphatase: 169 — AB (ref 25–125)
Bilirubin, Total: 0.3

## 2020-09-22 NOTE — Telephone Encounter (Signed)
FYI

## 2020-09-22 NOTE — Telephone Encounter (Signed)
Please let me know updated CBC result. Is she taking iron consistently 3 times a week? Or tell me how she is taking it  Please check fecal occult blood and make sure no blood in the stool. If there is blood in the stool consider GI follow-up. If no blood in the stool may be worth hematology consult

## 2020-09-22 NOTE — Telephone Encounter (Signed)
[facility] called [place] to let pt know that her hemoglobin was 7.2 on the 6th. They just drew labs today and ran them stat through [place]

## 2020-09-23 ENCOUNTER — Telehealth: Payer: Self-pay | Admitting: Hematology and Oncology

## 2020-09-23 ENCOUNTER — Other Ambulatory Visit: Payer: Self-pay

## 2020-09-23 DIAGNOSIS — D649 Anemia, unspecified: Secondary | ICD-10-CM

## 2020-09-23 NOTE — Telephone Encounter (Signed)
Blood transfusion is reasonable but I have not had success setting these up in past. May refer to hematology under symptomatic anemia. I am fine with blood draw every 2 weeks through home health under cbc and have nurse call us with results to make sure at least stable.

## 2020-09-23 NOTE — Telephone Encounter (Signed)
Received a new hem referral from Dr. Yong Channel for symptomatic anemia. [name] has been cld and scheduled to see Dr. Alvy Bimler on 12/21 at 2pm. Pt aware to arrive 30 minutes early.

## 2020-09-23 NOTE — Telephone Encounter (Signed)
Amy is calling in from [place] to update Dr.Hunter that the hemoglobin levels are still low the results were 7.4, symptomatic currently -cold and very tired. Amy is concerned that no one has been following up on the patients labs, asked for a call in return.

## 2020-09-23 NOTE — Addendum Note (Signed)
Addended by: Clyde Lundborg A on: 09/23/2020 10:09 AM   Modules accepted: Orders

## 2020-09-23 NOTE — Telephone Encounter (Signed)
Called and spoke with pt and she states she is taking her iron 3 times a week. She was supposed to have a colonoscopy last month but had to reschedule and I encouraged pt to go ahead and get this rescheduled. She wanted to know If you think she needs a blood transfusion since she has been very tired. Husband will come by and pick up stool cards.

## 2020-09-23 NOTE — Telephone Encounter (Signed)
Referral placed and pt made aware.  

## 2020-09-24 ENCOUNTER — Other Ambulatory Visit: Payer: Self-pay | Admitting: Cardiology

## 2020-09-25 ENCOUNTER — Other Ambulatory Visit: Payer: Self-pay | Admitting: Family Medicine

## 2020-09-26 ENCOUNTER — Other Ambulatory Visit: Payer: Self-pay

## 2020-09-26 ENCOUNTER — Other Ambulatory Visit: Payer: 59

## 2020-09-26 DIAGNOSIS — D649 Anemia, unspecified: Secondary | ICD-10-CM

## 2020-09-26 LAB — FECAL OCCULT BLOOD, IMMUNOCHEMICAL: Fecal Occult Bld: NEGATIVE

## 2020-09-29 LAB — BASIC METABOLIC PANEL
BUN: 11 (ref 4–21)
CO2: 22 (ref 13–22)
Chloride: 99 (ref 99–108)
Creatinine: 0.8 (ref 0.5–1.1)
Glucose: 375
Potassium: 4.5 (ref 3.4–5.3)
Sodium: 134 — AB (ref 137–147)

## 2020-09-29 LAB — COMPREHENSIVE METABOLIC PANEL
Albumin: 3.6 (ref 3.5–5.0)
Calcium: 9 (ref 8.7–10.7)
GFR calc Af Amer: 98
GFR calc non Af Amer: 85
Globulin: 2.1

## 2020-09-29 LAB — HEPATIC FUNCTION PANEL
ALT: 4 — AB (ref 7–35)
AST: 12 — AB (ref 13–35)
Alkaline Phosphatase: 128 — AB (ref 25–125)
Bilirubin, Total: 0.2

## 2020-09-29 LAB — CBC: RBC: 2.79 — AB (ref 3.87–5.11)

## 2020-09-30 ENCOUNTER — Inpatient Hospital Stay: Payer: 59 | Attending: Hematology and Oncology | Admitting: Hematology and Oncology

## 2020-09-30 ENCOUNTER — Encounter: Payer: Self-pay | Admitting: Hematology and Oncology

## 2020-09-30 ENCOUNTER — Other Ambulatory Visit: Payer: Self-pay

## 2020-09-30 ENCOUNTER — Inpatient Hospital Stay: Payer: 59

## 2020-09-30 VITALS — BP 108/66 | HR 81 | Temp 97.6°F | Resp 16 | Ht 66.0 in | Wt 225.1 lb

## 2020-09-30 DIAGNOSIS — D539 Nutritional anemia, unspecified: Secondary | ICD-10-CM | POA: Insufficient documentation

## 2020-09-30 DIAGNOSIS — E1122 Type 2 diabetes mellitus with diabetic chronic kidney disease: Secondary | ICD-10-CM | POA: Diagnosis not present

## 2020-09-30 DIAGNOSIS — D508 Other iron deficiency anemias: Secondary | ICD-10-CM

## 2020-09-30 DIAGNOSIS — R110 Nausea: Secondary | ICD-10-CM | POA: Insufficient documentation

## 2020-09-30 DIAGNOSIS — D860 Sarcoidosis of lung: Secondary | ICD-10-CM | POA: Diagnosis not present

## 2020-09-30 DIAGNOSIS — R42 Dizziness and giddiness: Secondary | ICD-10-CM | POA: Insufficient documentation

## 2020-09-30 DIAGNOSIS — D638 Anemia in other chronic diseases classified elsewhere: Secondary | ICD-10-CM | POA: Diagnosis not present

## 2020-09-30 DIAGNOSIS — N183 Chronic kidney disease, stage 3 unspecified: Secondary | ICD-10-CM | POA: Diagnosis not present

## 2020-09-30 DIAGNOSIS — I130 Hypertensive heart and chronic kidney disease with heart failure and stage 1 through stage 4 chronic kidney disease, or unspecified chronic kidney disease: Secondary | ICD-10-CM | POA: Insufficient documentation

## 2020-09-30 DIAGNOSIS — Z79899 Other long term (current) drug therapy: Secondary | ICD-10-CM | POA: Diagnosis not present

## 2020-09-30 DIAGNOSIS — Z794 Long term (current) use of insulin: Secondary | ICD-10-CM | POA: Diagnosis not present

## 2020-09-30 DIAGNOSIS — R55 Syncope and collapse: Secondary | ICD-10-CM

## 2020-09-30 LAB — CBC WITH DIFFERENTIAL/PLATELET
Abs Immature Granulocytes: 0.08 10*3/uL — ABNORMAL HIGH (ref 0.00–0.07)
Basophils Absolute: 0.1 10*3/uL (ref 0.0–0.1)
Basophils Relative: 1 %
Eosinophils Absolute: 0.5 10*3/uL (ref 0.0–0.5)
Eosinophils Relative: 7 %
HCT: 27 % — ABNORMAL LOW (ref 36.0–46.0)
Hemoglobin: 8.5 g/dL — ABNORMAL LOW (ref 12.0–15.0)
Immature Granulocytes: 1 %
Lymphocytes Relative: 29 %
Lymphs Abs: 2.3 10*3/uL (ref 0.7–4.0)
MCH: 28.1 pg (ref 26.0–34.0)
MCHC: 31.5 g/dL (ref 30.0–36.0)
MCV: 89.1 fL (ref 80.0–100.0)
Monocytes Absolute: 0.5 10*3/uL (ref 0.1–1.0)
Monocytes Relative: 7 %
Neutro Abs: 4.5 10*3/uL (ref 1.7–7.7)
Neutrophils Relative %: 55 %
Platelets: 280 10*3/uL (ref 150–400)
RBC: 3.03 MIL/uL — ABNORMAL LOW (ref 3.87–5.11)
RDW: 13.4 % (ref 11.5–15.5)
WBC: 8 10*3/uL (ref 4.0–10.5)
nRBC: 0 % (ref 0.0–0.2)

## 2020-09-30 LAB — COMPREHENSIVE METABOLIC PANEL
ALT: <6 U/L (ref 0–44)
AST: 17 U/L (ref 15–41)
Albumin: 3.4 g/dL — ABNORMAL LOW (ref 3.5–5.0)
Alkaline Phosphatase: 119 U/L (ref 38–126)
Anion gap: 6 (ref 5–15)
BUN: 12 mg/dL (ref 8–23)
CO2: 29 mmol/L (ref 22–32)
Calcium: 9.4 mg/dL (ref 8.9–10.3)
Chloride: 102 mmol/L (ref 98–111)
Creatinine, Ser: 0.79 mg/dL (ref 0.44–1.00)
GFR, Estimated: >60 mL/min (ref >60)
Glucose, Bld: 175 mg/dL — ABNORMAL HIGH (ref 70–99)
Potassium: 4.9 mmol/L (ref 3.5–5.1)
Sodium: 137 mmol/L (ref 135–145)
Total Bilirubin: 0.4 mg/dL (ref 0.3–1.2)
Total Protein: 6.9 g/dL (ref 6.5–8.1)

## 2020-09-30 LAB — VITAMIN B12: Vitamin B-12: 269 pg/mL (ref 180–914)

## 2020-09-30 LAB — IRON AND TIBC
Iron: 43 ug/dL (ref 41–142)
Saturation Ratios: 11 % — ABNORMAL LOW (ref 21–57)
TIBC: 373 ug/dL (ref 236–444)
UIBC: 330 ug/dL (ref 120–384)

## 2020-09-30 LAB — SEDIMENTATION RATE: Sed Rate: 62 mm/hr — ABNORMAL HIGH (ref 0–22)

## 2020-09-30 LAB — CBC AND DIFFERENTIAL
HCT: 25 — AB (ref 36–46)
Hemoglobin: 7.7 — AB (ref 12.0–16.0)
Neutrophils Absolute: 3.4
Platelets: 230 (ref 150–399)
WBC: 6

## 2020-09-30 LAB — SAMPLE TO BLOOD BANK

## 2020-09-30 LAB — FERRITIN: Ferritin: 43 ng/mL (ref 11–307)

## 2020-09-30 LAB — ABO/RH: ABO/RH(D): A POS

## 2020-09-30 NOTE — Assessment & Plan Note (Signed)
Prednisone was discontinued recently due to her fall and potential wound healing issues Her breathing is stable and she denies excessive cough I do not feel strongly she needs to resume prednisone She has no signs of adrenal insufficiency based on recent blood work

## 2020-09-30 NOTE — Assessment & Plan Note (Signed)
Recent fall is likely precipitated by postural dizziness Her blood pressure is low She has lost a lot of weight from her baseline of around 250 pounds I recommend reducing furosemide to once a day and to weigh herself daily

## 2020-09-30 NOTE — Progress Notes (Signed)
[facility] CONSULT NOTE  Patient Care Team: [name], MD as PCP - General (Family Medicine) Leonie Man, MD as PCP - Cardiology (Cardiology) Elsie Stain, MD (Pulmonary Disease) Elsie Saas, MD as Consulting Physician (Orthopedic Surgery) Wenda Low, MD as Referring Physician (Pulmonary Disease) Katy Apo, MD as Consulting Physician (Ophthalmology) Lyndee Hensen, PT as Physical Therapist (Physical Therapy) Jonelle Sidle, MD as Consulting Physician (Obstetrics and Gynecology) Edrick Kins, DPM as Consulting Physician (Podiatry)  CHIEF COMPLAINTS/PURPOSE OF CONSULTATION:  Severe progressive anemia  HISTORY OF PRESENTING ILLNESS:  [name] 59 y.o. female is here because of recent progressive anemia She is here accompanied by her husband I have the opportunity to review her blood test results from 2015 The patient has mild anemia over the last few years She has multiple chronic disease such as sarcoidosis, diabetes, congestive heart failure and others.   She has history of fundoplication.  She has multiple EGDs in the past, the last was in 2019 which showed no evidence of stomach ulcer or gastritis She takes chronic proton pump inhibitor Her hemoglobin stable between 10-11, most consistent with anemia of chronic disease  Around Halloween this year, she fell requiring left leg surgery.  Unfortunately, she developed complications with infection requiring IV antibiotics She also require blood transfusion intermittently According to her husband, advanced home care service has been drawing her blood from her PICC line and noted hemoglobin between 7.2-7.7 She has excessive fatigue This past Sunday, she had postural dizziness leading to a fall but she denies significant injuries She denies recent chest pain on exertion but has been somewhat sedentary since her surgery She was on chronic prednisone therapy but that was  discontinued in October.  She use inhalers. She denies shortness of breath on minimal exertions or palpitations. She had not noticed any recent bleeding such as epistaxis, hematuria or hematochezia The patient denies over the counter NSAID ingestion. She is on antiplatelets agents and Lovenox since surgery  She had no prior history or diagnosis of cancer. Her age appropriate screening programs are up-to-date. She denies any pica and eats a variety of diet. She never donated blood  The patient was prescribed oral iron supplements and she takes 2 times a day and she tolerated that poorly.  She has significant nausea  MEDICAL HISTORY:  Past Medical History:  Diagnosis Date  . Abnormal SPEP 04/17/2014  . Acute left ankle pain 01/26/2017  . ANEMIA-UNSPECIFIED 09/18/2009  . Anxiety   . Arthritis   . CHF (congestive heart failure) ([facility] [place])   . Chronic diastolic heart failure, NYHA class 2 (HCC)    Normal LVEDP by May 2018  . COPD (chronic obstructive pulmonary disease) ([place])   . Depression   . DIABETES MELLITUS, TYPE II 08/21/2006  . Diabetic osteomyelitis ([place]) 05/29/2015  . Fatty liver   . Fracture of 5th metatarsal    non union  . GERD 94/85/4627   Had fundoplication  . GOUT 08/20/2010  . Hammer toe of right foot    2-5th toes  . Hx of umbilical hernia repair   . HYPERLIPIDEMIA 08/21/2006  . HYPERTENSION 08/21/2006  . Infection of wound due to methicillin resistant Staphylococcus aureus (MRSA)   . Internal hemorrhoids   . Kidney problem   . Multiple allergies 10/14/2016  . OBESITY 06/04/2009  . Onychomycosis 10/27/2015  . Osteomyelitis of left foot ([place]) 05/29/2015  . Pulmonary sarcoidosis ([place])    Followed locally by pulmonology, but also  by Dr. Casper Harrison at [facility]  . Right knee pain 01/26/2017  . SOB (shortness of breath)   . Vocal cord dysfunction   . Wears partial dentures     SURGICAL HISTORY: Past Surgical History:  Procedure Laterality Date  . ABDOMINAL  HYSTERECTOMY    . APPENDECTOMY    . BLADDER SUSPENSION  11/11/2011   Procedure: TRANSVAGINAL TAPE (TVT) PROCEDURE;  Surgeon: Olga Millers, MD;  Location: [facility];  Service: Gynecology;  Laterality: N/A;  . CAROTIDS  02/18/11   CAROTID DUPLEX; VERTEBRALS ARE PATENT WITH ANTEGRADE FLOW. ICA/CCA RATIO 1.61 ON RIGHT AND 0.75 ON LEFT  . CHOLECYSTECTOMY  1984  . COLONOSCOPY  04/29/2010   [name]  . CYSTOSCOPY  11/11/2011   Procedure: CYSTOSCOPY;  Surgeon: Olga Millers, MD;  Location: [facility];  Service: Gynecology;  Laterality: N/A;  . EXTUBATION (ENDOTRACHEAL) IN OR N/A 09/23/2017   Procedure: EXTUBATION (ENDOTRACHEAL) IN OR;  Surgeon: Helayne Seminole, MD;  Location: [place] [facility];  Service: ENT;  Laterality: N/A;  . FIBEROPTIC LARYNGOSCOPY AND TRACHEOSCOPY N/A 09/23/2017   Procedure: FLEXIBLE FIBEROPTIC LARYNGOSCOPY;  Surgeon: Helayne Seminole, MD;  Location: [facility];  Service: ENT;  Laterality: N/A;  . FRACTURE SURGERY     foot  . HALLUX FUSION Left 10/02/2018   Procedure: HALLUX ARTHRODESIS;  Surgeon: Edrick Kins, DPM;  Location: [facility];  Service: Podiatry;  Laterality: Left;  . HAMMER TOE SURGERY Left 10/02/2018   Procedure: HAMMER TOE CORRECTION 2ND 3RD 4RD FIFTH TOE;  Surgeon: Edrick Kins, DPM;  Location: [facility];  Service: Podiatry;  Laterality: Left;  . HAMMER TOE SURGERY Right 04/12/2019   Procedure: HAMMER TOE CORRECTION, 2ND, 3RD, 4TH AND 5TH TOES OF RIGHT FOOT;  Surgeon: Edrick Kins, DPM;  Location: [facility];  Service: Podiatry;  Laterality: Right;  . HAMMER TOE SURGERY Left 10/25/2019   Procedure: HAMMER TOE REPAIR SECOND, THIRD, FOUTH;  Surgeon: Edrick Kins, DPM;  Location[facility];  Service: Podiatry;  Laterality: Left;  . HERNIA REPAIR    . I & D EXTREMITY Left 06/27/2015   Procedure: Partial Excision Left Calcaneus, Place Antibiotic Beads, and Wound VAC;  Surgeon: Newt Minion, MD;  Location: [facility];  Service: Orthopedics;  Laterality: Left;  . JOINT REPLACEMENT    . KNEE  ARTHROSCOPY     right  . LEFT AND RIGHT HEART CATHETERIZATION WITH CORONARY ANGIOGRAM N/A 04/23/2013   Procedure: LEFT AND RIGHT HEART CATHETERIZATION WITH CORONARY ANGIOGRAM;  Surgeon: Leonie Man, MD;  Location: [facility];  Service: Cardiovascular;  Laterality: N/A;  . LEFT HEART CATH AND CORONARY ANGIOGRAPHY N/A 03/11/2017   Procedure: Left Heart Cath and Coronary Angiography;  Surgeon: Sherren Mocha, MD;  Location: [facility] LAB; angiographically minimal CAD in the LAD otherwise normal.;  Normal LVEDP.  FALSE POSITIVE MYOVIEW  . LEFT HEART CATH AND CORONARY ANGIOGRAPHY  07/20/2010   LVEF 50-55% WITH VERY MILD GLOBAL HYPOKINESIA; ESSENTIALLY NORMAL CORONARY ARTERIES; NORMAL LV FUNCTION  . METATARSAL OSTEOTOMY WITH OPEN REDUCTION INTERNAL FIXATION (ORIF) METATARSAL WITH FUSION Left 04/09/2014   Procedure: LEFT FOOT FRACTURE OPEN TREATMENT METATARSAL INCLUDES INTERNAL FIXATION EACH;  Surgeon: Lorn Junes, MD;  Location: [place];  Service: Orthopedics;  Laterality: Left;  . NISSEN FUNDOPLICATION  0388  . NM MYOVIEW LTD  03/09/2013   Lexiscan --> EF 50%; NORMAL MYOCARDIAL PERFUSION STUDY - breast attenuation  . NM MYOVIEW LTD  03/10/2017   :  Moderate size "stress-induced "perfusion defect at the apex as well as "ill-defined stress-induced perfusion defect in the lateral wall.  EF 55%.  INTERMEDIATE risk. -->  FALSE POSITIVE  . Right and left CARDIAC CATHETERIZATION  04/23/2013   Angiographic normal coronaries; LVEDP 20 mmHg, PCWP 12-14 mmHg, RAP 12 mmHg.; Fick CO/CI 4.9/2.2  . SHOULDER ARTHROSCOPY Left 03/14/2019   Procedure: LEFT SHOULDER ARTHROSCOPY, DEBRIDEMENT, AND DECOMPRESSION;  Surgeon: Newt Minion, MD;  Location: [facility];  Service: Orthopedics;  Laterality: Left;  . TOTAL KNEE ARTHROPLASTY Right 06/29/2017   Procedure: RIGHT TOTAL KNEE ARTHROPLASTY;  Surgeon: Newt Minion, MD;  Location: [facility];  Service: Orthopedics;  Laterality: Right;  . TOTAL KNEE  ARTHROPLASTY Left 12/07/2017  . TOTAL KNEE ARTHROPLASTY Left 12/07/2017   Procedure: LEFT TOTAL KNEE ARTHROPLASTY;  Surgeon: Newt Minion, MD;  Location: [place];  Service: Orthopedics;  Laterality: Left;  . TRACHEOSTOMY TUBE PLACEMENT N/A 09/20/2017   Procedure: AWAKE INTUBATION WITH ANESTHESIA WITH VIDEO ASSISTANCE;  Surgeon: Helayne Seminole, MD;  Location: [facility];  Service: ENT;  Laterality: N/A;  . TRANSTHORACIC ECHOCARDIOGRAM  08/2014   Normal LV size and function.  Mild LVH.  EF 55-60%.  Normal regional wall motion.  GR 1 DD.  Normal RV size and function .  [name] TUBAL LIGATION     with reversal in 1994  . VENTRAL HERNIA REPAIR      SOCIAL HISTORY: Social History   Socioeconomic History  . Marital status: Married    Spouse name: RAINY ROTHMAN  . Number of children: 2  . Years of education: 69  . Highest education level: Not on file  Occupational History  . Occupation: DISABLED  Tobacco Use  . Smoking status: Never Smoker  . Smokeless tobacco: Never Used  Vaping Use  . Vaping Use: Never used  Substance and Sexual Activity  . Alcohol use: No    Alcohol/week: 0.0 standard drinks  . Drug use: No  . Sexual activity: Yes    Birth control/protection: Surgical  Other Topics Concern  . Not on file  Social History Narrative   Married 1994. 2 sons who both live close and 1 grandson.       Disability due to sarcoidosis. 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 Diabetes Father   . Heart attack Father  70  . Coronary artery disease Father   . Heart failure Father   . Throat cancer Father   . Hypertension Father   . Heart disease Father   . Sleep apnea Father   . Obesity Father   . COPD Mother   . Emphysema Mother   . Asthma Mother   . Heart failure Mother   . Breast cancer Mother   . Diabetes Mother   . Kidney disease Mother   . Thyroid disease Mother   . Heart attack Maternal Grandfather   . Sarcoidosis Maternal Uncle   . Lung cancer Brother   . Diabetes Brother   .  Colon cancer Neg Hx   . Colon polyps Neg Hx   . Esophageal cancer Neg Hx   . Rectal cancer Neg Hx   . Stomach cancer Neg Hx     ALLERGIES:  is allergic to methotrexate, vancomycin, lisinopril, chlorhexidine, clindamycin/lincomycin, doxycycline, and teflaro [ceftaroline].  MEDICATIONS:  Current Outpatient Medications  Medication Sig Dispense Refill  . albuterol (PROVENTIL HFA;VENTOLIN HFA) 108 (90 Base) MCG/ACT inhaler Inhale 1-2 puffs into the lungs every 6 (six) hours as needed for wheezing or shortness of breath. 8 g 5  . allopurinol (ZYLOPRIM) 100 MG tablet Take 100 mg by mouth every evening.     [name] aspirin EC 81 MG tablet Take 162 mg by mouth daily.     [name] atorvastatin (LIPITOR) 40 MG tablet TAKE 1 TABLET BY MOUTH EVERY DAY 90 tablet 3  . BREO ELLIPTA 200-25 MCG/INH AEPB TAKE 1 PUFF BY MOUTH EVERY DAY 60 each 5  . buPROPion (WELLBUTRIN XL) 300 MG 24 hr tablet TAKE 1 TABLET BY MOUTH EVERY DAY 90 tablet 3  . calcium carbonate (OS-CAL - DOSED IN MG OF ELEMENTAL CALCIUM) 1250 (500 Ca) MG tablet Take 1 tablet by mouth daily.    . carvedilol (COREG) 12.5 MG tablet Take 12.5 mg by mouth 2 (two) times daily with a meal.    . Cholecalciferol 25 MCG (1000 UT) capsule Take 1,000 Units by mouth in the morning and at bedtime.    . Colchicine 0.6 MG CAPS Take 0.6 mg by mouth 2 (two) times daily as needed for up to 4 days (may discontinue if symptoms improve). 20 capsule 1  . DEXILANT 60 MG capsule TAKE 1 CAPSULE BY MOUTH  EVERY DAY 90 capsule 3  . diclofenac Sodium (VOLTAREN) 1 % GEL Apply 1 application topically as needed (pain.).     [name] diltiazem (CARDIZEM CD) 120 MG 24 hr capsule TAKE 1 CAPSULE BY MOUTH EVERY DAY 90 capsule 0  . Dulaglutide (TRULICITY) 3 BH/4.1PF SOPN Inject 0.5 mLs (3 mg total) into the skin once a week. 12 pen 3  . enoxaparin (LOVENOX) 40 MG/0.4ML injection Inject into the skin.    . famotidine (PEPCID) 20 MG tablet Take 1 tablet (20 mg total) by mouth at bedtime. 30 tablet 3  . fenofibrate 160 MG tablet TAKE 1 TABLET BY MOUTH EVERY DAY 30 tablet 2  . fluticasone (FLONASE) 50 MCG/ACT nasal spray Place 2 sprays into both nostrils daily as needed for allergies. 48 mL 3  . furosemide (LASIX) 40 MG tablet TAKE 1 TABLET BY MOUTH TWICE A DAY 180 tablet 1  . gabapentin (NEURONTIN) 100 MG capsule TAKE 1 CAPSULE BY MOUTH THREE TIMES A DAY 270 capsule 3  . glucose blood (CONTOUR NEXT TEST) test strip 1 each by Other route 4 (four) times daily. And lancets 4/day 400 each 3  . HYDROcodone-acetaminophen (NORCO/VICODIN) 5-325 MG tablet Take 1 tablet by mouth every 6 (six) hours as needed. 15 tablet 0  . insulin glargine (LANTUS SOLOSTAR) 100 UNIT/ML Solostar Pen Inject 25 Units into the skin every morning. 30 mL 2  . insulin lispro (HUMALOG KWIKPEN) 100 UNIT/ML KwikPen Inject 0.15 mLs (15 Units total) into the skin daily with supper. 15 mL 29  . Insulin Pen Needle (PEN NEEDLES) 32G X 4 MM MISC 1 each by Does not apply route 4 (four) times daily. E11.9 400 each 0  . isosorbide mononitrate (IMDUR) 30 MG 24 hr tablet TAKE 1 TABLET BY MOUTH AT BEDTIME. 90 tablet 0  .  KLOR-CON M20 20 MEQ tablet TAKE 1 & 1/2 TABLETS BY MOUTH TWICE A DAY 270 tablet 2  . lactulose (CHRONULAC) 10 GM/15ML solution TAKE 15 MLS (10 G TOTAL) BY MOUTH DAILY AS NEEDED (CONSTIPATION). 473 mL 1  . LORazepam (ATIVAN) 0.5 MG tablet Take 0.5 mg by mouth every 12 (twelve) hours as needed for anxiety.    . metFORMIN (GLUCOPHAGE) 1000 MG tablet  Take 1 tablet (1,000 mg total) by mouth 2 (two) times daily with a meal. 180 tablet 1  . Microlet Lancets MISC 1 each by Does not apply route 2 (two) times daily. E11.9 100 each 2  . nitroGLYCERIN (NITROSTAT) 0.4 MG SL tablet Place 1 tablet (0.4 mg total) under the tongue every 5 (five) minutes as needed for chest pain. Reported on 01/05/2016 25 tablet 6  . ondansetron (ZOFRAN-ODT) 4 MG disintegrating tablet Take 1 tablet (4 mg total) by mouth every 8 (eight) hours as needed for nausea or vomiting. 50 tablet 2  . rifampin (RIFADIN) 300 MG capsule Take by mouth.    . Sodium Sulfate-Mag Sulfate-KCl (SUTAB) [phone] MG TABS Take 1 kit by mouth as directed. MANUFACTURER CODES!! BIN: [id number] PCN: CN GROUP: [id number] MEMBER ID: [id number];QJF AS CASH;NO PRIOR AUTHORIZATION 24 tablet 0  . telmisartan (MICARDIS) 20 MG tablet TAKE 1 TABLET BY MOUTH EVERY DAY 90 tablet 1  . tiZANidine (ZANAFLEX) 4 MG tablet Take 0.5-1 tablets (2-4 mg total) by mouth every 8 (eight) hours as needed for muscle spasms. 30 tablet 1  . venlafaxine XR (EFFEXOR-XR) 75 MG 24 hr capsule TAKE 1 CAPSULE BY MOUTH TWICE A DAY 180 capsule 3   No current facility-administered medications for this visit.    REVIEW OF SYSTEMS:   Constitutional: Denies fevers, chills or abnormal night sweats Eyes: Denies blurriness of vision, double vision or watery eyes Ears, nose, mouth, throat, and face: Denies mucositis or sore throat Respiratory: Denies cough, dyspnea or wheezes Cardiovascular: Denies palpitation, chest discomfort or lower extremity swelling Skin: Denies abnormal skin rashes Lymphatics: Denies new lymphadenopathy or easy bruising Behavioral/Psych: Mood is stable, no new changes  All other systems were reviewed with the patient and are negative.  PHYSICAL EXAMINATION: ECOG PERFORMANCE STATUS: 2 - Symptomatic, <50% confined to bed  Vitals:   09/30/20 1353  BP: 108/66  Pulse: 81  Resp: 16  Temp: 97.6 F (36.4 C)  SpO2:  100%   Filed Weights   09/30/20 1353  Weight: 225 lb 1.6 oz (102.1 kg)    GENERAL:alert, no distress and comfortable.  She is examined sitting on the wheelchair.  She looks a bit pale SKIN: skin color, texture, turgor are normal, no rashes or significant lesions.  No skin bruising EYES: normal, conjunctiva are pink and non-injected, sclera clear OROPHARYNX:no exudate, no erythema and lips, buccal mucosa, and tongue normal  NECK: supple, thyroid normal size, non-tender, without nodularity LYMPH:  no palpable lymphadenopathy in the cervical, axillary or inguinal LUNGS: clear to auscultation and percussion with normal breathing effort HEART: regular rate & rhythm and no murmurs and no lower extremity edema ABDOMEN:abdomen soft, non-tender and normal bowel sounds Musculoskeletal:no cyanosis of digits and no clubbing  PSYCH: alert & oriented x 3 with fluent speech NEURO: no focal motor/sensory deficits  Lab Results  Component Value Date   WBC 8.0 09/30/2020   HGB 8.5 (L) 09/30/2020   HCT 27.0 (L) 09/30/2020   MCV 89.1 09/30/2020   PLT 280 09/30/2020    ASSESSMENT & PLAN:  Deficiency anemia The  cause of her anemia is multifactorial, likely combination of anemia of chronic illness from recent hospitalization and bone marrow suppression from antibiotics therapy She is mildly symptomatic Repeat stat CBC my office show hemoglobin of 8.5 She does not need blood transfusion today I have ordered iron studies and vitamin B12 level I will call her with results tomorrow Due to her intermittent nausea and poor absorption of iron, I recommend discontinuation of oral iron supplement I plan to see her again in 2 weeks for further follow-up and transfusion support if needed  CKD (chronic kidney disease) stage 3, GFR 30-59 ml/min (HCC) She has intermittent chronic kidney disease secondary to cardiac issues and diabetes She will continue aggressive risk factor modification  Pulmonary sarcoidosis  (HCC) Prednisone was discontinued recently due to her fall and potential wound healing issues Her breathing is stable and she denies excessive cough I do not feel strongly she needs to resume prednisone She has no signs of adrenal insufficiency based on recent blood work  Postural dizziness with presyncope Recent fall is likely precipitated by postural dizziness Her blood pressure is low She has lost a lot of weight from her baseline of around 250 pounds I recommend reducing furosemide to once a day and to weigh herself daily  Orders Placed This Encounter  Procedures  . Comprehensive metabolic panel    Standing Status:   Standing    Number of Occurrences:   33    Standing Expiration Date:   09/30/2021  . CBC with Differential/Platelet    Standing Status:   Standing    Number of Occurrences:   22    Standing Expiration Date:   09/30/2021  . Iron and TIBC    Standing Status:   Future    Number of Occurrences:   1    Standing Expiration Date:   09/30/2021  . Vitamin B12    Standing Status:   Future    Number of Occurrences:   1    Standing Expiration Date:   09/30/2021  . Sedimentation rate    Standing Status:   Future    Number of Occurrences:   1    Standing Expiration Date:   09/30/2021  . Ferritin    Standing Status:   Future    Number of Occurrences:   1    Standing Expiration Date:   09/30/2021  . ABO/RH    Standing Status:   Future    Number of Occurrences:   1    Standing Expiration Date:   09/30/2021  . Sample to Blood Bank    Standing Status:   Standing    Number of Occurrences:   33    Standing Expiration Date:   09/30/2021    All questions were answered. The patient knows to call the clinic with any problems, questions or concerns.  The total time spent in the appointment was 60 minutes encounter with patients including review of chart and various tests results, discussions about plan of care and coordination of care plan  Heath Lark, MD 12/21/20213:11  PM       Heath Lark, MD 09/30/20 3:11 PM

## 2020-09-30 NOTE — Assessment & Plan Note (Signed)
The cause of her anemia is multifactorial, likely combination of anemia of chronic illness from recent hospitalization and bone marrow suppression from antibiotics therapy She is mildly symptomatic Repeat stat CBC my office show hemoglobin of 8.5 She does not need blood transfusion today I have ordered iron studies and vitamin B12 level I will call her with results tomorrow Due to her intermittent nausea and poor absorption of iron, I recommend discontinuation of oral iron supplement I plan to see her again in 2 weeks for further follow-up and transfusion support if needed

## 2020-09-30 NOTE — Assessment & Plan Note (Signed)
She has intermittent chronic kidney disease secondary to cardiac issues and diabetes She will continue aggressive risk factor modification

## 2020-10-01 ENCOUNTER — Other Ambulatory Visit: Payer: Self-pay | Admitting: Hematology and Oncology

## 2020-10-01 ENCOUNTER — Telehealth: Payer: Self-pay

## 2020-10-01 DIAGNOSIS — D509 Iron deficiency anemia, unspecified: Secondary | ICD-10-CM | POA: Insufficient documentation

## 2020-10-01 NOTE — Telephone Encounter (Signed)
MD provided recommendations based on lab results for patient to remain off of iron supplement, and have patient recheck labs in 2 weeks.    RN notified patient, patient verbalized understanding and agreement.  Patient is set up for labs/MD/Infusion on 1/5.   No further needs at this time.

## 2020-10-02 ENCOUNTER — Other Ambulatory Visit: Payer: Self-pay | Admitting: Cardiology

## 2020-10-02 ENCOUNTER — Telehealth: Payer: Self-pay

## 2020-10-02 NOTE — Telephone Encounter (Signed)
See below

## 2020-10-02 NOTE — Telephone Encounter (Signed)
Noted thanks- if new symptoms please let me know

## 2020-10-02 NOTE — Telephone Encounter (Signed)
Amy is calling from [place] to update Dr.Hunter that she had a fall on Sunday, no injuries or bruises.

## 2020-10-06 ENCOUNTER — Other Ambulatory Visit: Payer: Self-pay | Admitting: Family Medicine

## 2020-10-06 ENCOUNTER — Encounter: Payer: Self-pay | Admitting: Physician Assistant

## 2020-10-06 ENCOUNTER — Other Ambulatory Visit: Payer: 59

## 2020-10-06 ENCOUNTER — Telehealth (INDEPENDENT_AMBULATORY_CARE_PROVIDER_SITE_OTHER): Payer: 59 | Admitting: Physician Assistant

## 2020-10-06 VITALS — Ht 66.0 in | Wt 220.0 lb

## 2020-10-06 DIAGNOSIS — R0981 Nasal congestion: Secondary | ICD-10-CM | POA: Diagnosis not present

## 2020-10-06 DIAGNOSIS — Z20822 Contact with and (suspected) exposure to covid-19: Secondary | ICD-10-CM

## 2020-10-06 LAB — CBC AND DIFFERENTIAL
HCT: 26 — AB (ref 36–46)
Hemoglobin: 8.1 — AB (ref 12.0–16.0)
Neutrophils Absolute: 4.7
Platelets: 244 (ref 150–399)
WBC: 7.3

## 2020-10-06 LAB — BASIC METABOLIC PANEL
BUN: 11 (ref 4–21)
CO2: 23 — AB (ref 13–22)
Chloride: 98 — AB (ref 99–108)
Creatinine: 0.9 (ref 0.5–1.1)
Glucose: 237
Potassium: 4.8 (ref 3.4–5.3)
Sodium: 135 — AB (ref 137–147)

## 2020-10-06 LAB — HEPATIC FUNCTION PANEL
ALT: 6 — AB (ref 7–35)
AST: 19 (ref 13–35)
Alkaline Phosphatase: 116 (ref 25–125)
Bilirubin, Total: 0.5

## 2020-10-06 LAB — COMPREHENSIVE METABOLIC PANEL
Albumin: 3.7 (ref 3.5–5.0)
Calcium: 9 (ref 8.7–10.7)
GFR calc Af Amer: 77
GFR calc non Af Amer: 67
Globulin: 2.4

## 2020-10-06 LAB — CBC: RBC: 2.9 — AB (ref 3.87–5.11)

## 2020-10-06 MED ORDER — HYDROCOD POLST-CPM POLST ER 10-8 MG/5ML PO SUER: 5.0000 mL | Freq: Every evening | ORAL | 0 refills | Status: DC | PRN

## 2020-10-06 NOTE — Progress Notes (Signed)
Virtual Visit via Video   I connected with [name] on 10/06/20 at 12:30 PM EST by a video enabled telemedicine application and verified that I am speaking with the correct person using two identifiers. Location patient: Home Location provider: [facility], Office Persons participating in the virtual visit: Jinan, Biggins PA-C, Anselmo Pickler, LPN   I discussed the limitations of evaluation and management by telemedicine and the availability of in person appointments. The patient expressed understanding and agreed to proceed.  I acted as a Education administrator for [employer], [employer], LPN   Subjective:   HPI:   Sinus pressure Pt c/o sinus congestion, pressure around eyes x 3 days. Coughing and expectorating clear sputum and having clear nasal drainage. Denies fever or chills, SOB, wheezing.  She has hx of sarcoidosis and has been without her 5 mg prednisone daily since around Halloween due to recent orthopedic surgery. She also has recent MSSA infection and is currently being treated with Cefazolin 2 g TID and Rifampin 300 mg BID.  She is using her Breo inhaler as scheduled. She has not needed her Albuterol inhaler.  Hasn't been COVID tested.   ROS: See pertinent positives and negatives per HPI.  Patient Active Problem List   Diagnosis Date Noted  . Iron deficiency anemia 10/01/2020  . Postural dizziness with presyncope 09/30/2020  . Hypertriglyceridemia 04/02/2020  . Aortic atherosclerosis ([place]) 02/14/2020  . Nontraumatic complete tear of left rotator cuff   . Diabetic neuropathy ([place]) 02/20/2019  . Severe persistent asthma 01/04/2019  . Subcortical microvascular ischemic occlusive disease 07/12/2018  . Rapid palpitations 07/12/2018  . Impingement of left ankle joint 06/26/2018  . Constipation 04/21/2018  . Total knee replacement status, left 12/07/2017  . Dysphagia 09/20/2017  . Epiglottitis   . Plantar fasciitis, right 07/13/2017  .  S/P total knee arthroplasty, right 06/29/2017  . Osteoarthrosis, localized, primary, knee, right   . Sprain of calcaneofibular ligament of right ankle 06/06/2017  . Osteoarthritis of right knee 01/26/2017  . Onychomycosis 10/27/2015  . CKD (chronic kidney disease) stage 3, GFR 30-59 ml/min (HCC) 04/27/2015  . MRSA (methicillin resistant staph aureus) culture positive 03/27/2015  . History of osteomyelitis 03/27/2015  . Fatty liver 09/30/2014  . Hot flashes 07/15/2014  . Abnormal SPEP 04/17/2014  . Fracture of left leg 04/17/2014  . Cushingoid side effect of steroids ([place]) 04/17/2014  . Internal hemorrhoids   . Pulmonary sarcoidosis ([place])   . Major depression in full remission ([place])   . Preoperative clearance 03/25/2014  . Obstructive chronic bronchitis without exacerbation ([place]) 09/18/2013  . Chest pain 04/11/2013  . Hypertensive heart disease with chronic diastolic congestive heart failure ([place])   . Solitary pulmonary nodule, on CT 02/2013 - stable over 2 years in 2015 02/20/2013  . Gout 08/20/2010  . Deficiency anemia 09/18/2009  . Morbid obesity ([place]) 06/04/2009  . Sleep apnea 04/21/2009  . Sarcoidosis of lung ([place]) 04/10/2007  . Hyperlipidemia associated with type 2 diabetes mellitus ([place]) 08/21/2006  . Hypertension associated with diabetes ([place]) 08/21/2006  . GERD 08/21/2006  . Type II diabetes mellitus with renal manifestations ([place]) 08/21/2006    Social History   Tobacco Use  . Smoking status: Never Smoker  . Smokeless tobacco: Never Used  Substance Use Topics  . Alcohol use: No    Alcohol/week: 0.0 standard drinks    Current Outpatient Medications:  .  albuterol (PROVENTIL HFA;VENTOLIN HFA) 108 (90 Base) MCG/ACT inhaler, Inhale 1-2 puffs into  the lungs every 6 (six) hours as needed for wheezing or shortness of breath., Disp: 8 g, Rfl: 5 .  allopurinol (ZYLOPRIM) 100 MG tablet, Take 100 mg by mouth every evening. , Disp: , Rfl:  .  aspirin EC 81 MG tablet, Take 162  mg by mouth daily. , Disp: , Rfl:  .  atorvastatin (LIPITOR) 40 MG tablet, TAKE 1 TABLET BY MOUTH EVERY DAY, Disp: 90 tablet, Rfl: 3 .  BREO ELLIPTA 200-25 MCG/INH AEPB, TAKE 1 PUFF BY MOUTH EVERY DAY, Disp: 60 each, Rfl: 5 .  buPROPion (WELLBUTRIN XL) 300 MG 24 hr tablet, TAKE 1 TABLET BY MOUTH EVERY DAY, Disp: 90 tablet, Rfl: 3 .  calcium carbonate (OS-CAL - DOSED IN MG OF ELEMENTAL CALCIUM) 1250 (500 Ca) MG tablet, Take 1 tablet by mouth daily., Disp: , Rfl:  .  carvedilol (COREG) 12.5 MG tablet, Take 12.5 mg by mouth 2 (two) times daily with a meal., Disp: , Rfl:  .  Cholecalciferol 25 MCG (1000 UT) capsule, Take 1,000 Units by mouth in the morning and at bedtime., Disp: , Rfl:  .  DEXILANT 60 MG capsule, TAKE 1 CAPSULE BY MOUTH EVERY DAY, Disp: 90 capsule, Rfl: 3 .  diclofenac Sodium (VOLTAREN) 1 % GEL, Apply 1 application topically as needed (pain.). , Disp: , Rfl:  .  diltiazem (CARDIZEM CD) 120 MG 24 hr capsule, TAKE 1 CAPSULE BY MOUTH EVERY DAY, Disp: 90 capsule, Rfl: 0 .  Dulaglutide (TRULICITY) 3 LP/3.7TK SOPN, Inject 0.5 mLs (3 mg total) into the skin once a week., Disp: 12 pen, Rfl: 3 .  enoxaparin (LOVENOX) 40 MG/0.4ML injection, Inject into the skin., Disp: , Rfl:  .  famotidine (PEPCID) 20 MG tablet, Take 1 tablet (20 mg total) by mouth at bedtime., Disp: 30 tablet, Rfl: 3 .  fenofibrate 160 MG tablet, TAKE 1 TABLET BY MOUTH EVERY DAY, Disp: 30 tablet, Rfl: 2 .  fluticasone (FLONASE) 50 MCG/ACT nasal spray, Place 2 sprays into both nostrils daily as needed for allergies., Disp: 48 mL, Rfl: 3 .  furosemide (LASIX) 40 MG tablet, TAKE 1 TABLET BY MOUTH TWICE A DAY, Disp: 180 tablet, Rfl: 1 .  gabapentin (NEURONTIN) 100 MG capsule, TAKE 1 CAPSULE BY MOUTH THREE TIMES A DAY, Disp: 270 capsule, Rfl: 3 .  glucose blood (CONTOUR NEXT TEST) test strip, 1 each by Other route 4 (four) times daily. And lancets 4/day, Disp: 400 each, Rfl: 3 .  HYDROcodone-acetaminophen (NORCO/VICODIN) 5-325 MG  tablet, Take 1 tablet by mouth every 6 (six) hours as needed., Disp: 15 tablet, Rfl: 0 .  insulin glargine (LANTUS SOLOSTAR) 100 UNIT/ML Solostar Pen, Inject 25 Units into the skin every morning., Disp: 30 mL, Rfl: 2 .  insulin lispro (HUMALOG KWIKPEN) 100 UNIT/ML KwikPen, Inject 0.15 mLs (15 Units total) into the skin daily with supper., Disp: 15 mL, Rfl: 29 .  Insulin Pen Needle (PEN NEEDLES) 32G X 4 MM MISC, 1 each by Does not apply route 4 (four) times daily. E11.9, Disp: 400 each, Rfl: 0 .  isosorbide mononitrate (IMDUR) 30 MG 24 hr tablet, TAKE 1 TABLET BY MOUTH EVERYDAY AT BEDTIME, Disp: 90 tablet, Rfl: 0 .  KLOR-CON M20 20 MEQ tablet, TAKE 1 & 1/2 TABLETS BY MOUTH TWICE A DAY, Disp: 270 tablet, Rfl: 2 .  lactulose (CHRONULAC) 10 GM/15ML solution, TAKE 15 MLS (10 G TOTAL) BY MOUTH DAILY AS NEEDED (CONSTIPATION)., Disp: 473 mL, Rfl: 1 .  LORazepam (ATIVAN) 0.5 MG tablet, Take 0.5  mg by mouth every 12 (twelve) hours as needed for anxiety., Disp: , Rfl:  .  metFORMIN (GLUCOPHAGE) 1000 MG tablet, Take 1 tablet (1,000 mg total) by mouth 2 (two) times daily with a meal., Disp: 180 tablet, Rfl: 1 .  Microlet Lancets MISC, 1 each by Does not apply route 2 (two) times daily. E11.9, Disp: 100 each, Rfl: 2 .  nitroGLYCERIN (NITROSTAT) 0.4 MG SL tablet, Place 1 tablet (0.4 mg total) under the tongue every 5 (five) minutes as needed for chest pain. Reported on 01/05/2016, Disp: 25 tablet, Rfl: 6 .  ondansetron (ZOFRAN-ODT) 4 MG disintegrating tablet, Take 1 tablet (4 mg total) by mouth every 8 (eight) hours as needed for nausea or vomiting., Disp: 50 tablet, Rfl: 2 .  rifampin (RIFADIN) 300 MG capsule, Take 300 mg by mouth 2 (two) times daily., Disp: , Rfl:  .  telmisartan (MICARDIS) 20 MG tablet, TAKE 1 TABLET BY MOUTH EVERY DAY, Disp: 90 tablet, Rfl: 1 .  tiZANidine (ZANAFLEX) 4 MG tablet, Take 0.5-1 tablets (2-4 mg total) by mouth every 8 (eight) hours as needed for muscle spasms., Disp: 30 tablet, Rfl:  1 .  venlafaxine XR (EFFEXOR-XR) 75 MG 24 hr capsule, TAKE 1 CAPSULE BY MOUTH TWICE A DAY, Disp: 180 capsule, Rfl: 3 .  Colchicine 0.6 MG CAPS, Take 0.6 mg by mouth 2 (two) times daily as needed for up to 4 days (may discontinue if symptoms improve)., Disp: 20 capsule, Rfl: 1 .  linaclotide (LINZESS) 290 MCG CAPS capsule, Take by mouth., Disp: , Rfl:  .  Sodium Sulfate-Mag Sulfate-KCl (SUTAB) [phone] MG TABS, Take 1 kit by mouth as directed. MANUFACTURER CODES!! BIN: [id number] PCN: CN GROUP: [id number] MEMBER ID: [id number];TKW AS CASH;NO PRIOR AUTHORIZATION (Patient not taking: Reported on 10/06/2020), Disp: 24 tablet, Rfl: 0  Allergies  Allergen Reactions  . Methotrexate Other (See Comments)    Peri-oral and buccal lesions  . Vancomycin Other (See Comments)    DOSE RELATED NEPHROTOXICITY  . Lisinopril Cough  . Chlorhexidine Itching  . Clindamycin/Lincomycin Nausea And Vomiting and Rash  . Doxycycline Rash  . Teflaro [Ceftaroline] Rash and Other (See Comments)    Tolerates ceftriaxone     Objective:   VITALS: Per patient if applicable, see vitals. GENERAL: Alert, appears well and in no acute distress. HEENT: Atraumatic, conjunctiva clear, no obvious abnormalities on inspection of external nose and ears. NECK: Normal movements of the head and neck. CARDIOPULMONARY: No increased WOB. Speaking in clear sentences. I:E ratio WNL.  MS: Moves all visible extremities without noticeable abnormality. PSYCH: Pleasant and cooperative, well-groomed. Speech normal rate and rhythm. Affect is appropriate. Insight and judgement are appropriate. Attention is focused, linear, and appropriate.  NEURO: CN grossly intact. Oriented as arrived to appointment on time with no prompting. Moves both UE equally.  SKIN: No obvious lesions, wounds, erythema, or cyanosis noted on face or hands.  Assessment and Plan:   Ryder was seen today for sinus problem.  Diagnoses and all orders for this visit:  Sinus  congestion   No red flags on discussion, patient is not in any obvious distress during our visit. Discussed progression of most viral illness, and recommended supportive care at this point in time. Symptoms do not reflect bacterial illness at this point. I do recommend COVID testing as she is currently high risk  -- I have asked our front desk to schedule her for testing this afternoon. I have refilled her Tussionex for this, she uses this very conservatively  and knows that it can make her drowsy. Discussed over the counter supportive care options, with recommendations to push fluids and rest. Reviewed return precautions including new/worsening fever, SOB, new/worsening cough or other concerns.  Recommended need to self-quarantine and practice social distancing until symptoms resolve. I recommend that patient follow-up if symptoms worsen or persist despite treatment x 7-10 days, sooner if needed.  I discussed the assessment and treatment plan with the patient. The patient was provided an opportunity to ask questions and all were answered. The patient agreed with the plan and demonstrated an understanding of the instructions.   The patient was advised to call back or seek an in-person evaluation if the symptoms worsen or if the condition fails to improve as anticipated.   CMA or LPN served as scribe during this visit. History, Physical, and Plan performed by medical provider. The above documentation has been reviewed and is accurate and complete.  [place], [place] 10/06/2020

## 2020-10-07 LAB — NOVEL CORONAVIRUS, NAA: SARS-CoV-2, NAA: NOT DETECTED

## 2020-10-07 LAB — SARS-COV-2, NAA 2 DAY TAT

## 2020-10-09 ENCOUNTER — Other Ambulatory Visit: Payer: Self-pay | Admitting: Family Medicine

## 2020-10-09 ENCOUNTER — Telehealth: Payer: Self-pay

## 2020-10-09 NOTE — Telephone Encounter (Signed)
Called and spoke with [place] and provided VO.

## 2020-10-09 NOTE — Telephone Encounter (Signed)
[facility] is requesting verbal orders for physical therapy for 2x a week for 2 more weeks  [phone]. Ok to leave voicemanil

## 2020-10-13 LAB — COMPREHENSIVE METABOLIC PANEL
Albumin: 3.9 (ref 3.5–5.0)
Calcium: 9.2 (ref 8.7–10.7)
GFR calc Af Amer: 87
GFR calc non Af Amer: 75
Globulin: 2

## 2020-10-13 LAB — HEPATIC FUNCTION PANEL
ALT: 3 — AB (ref 7–35)
AST: 13 (ref 13–35)
Alkaline Phosphatase: 117 (ref 25–125)
Bilirubin, Total: 0.2

## 2020-10-13 LAB — CBC AND DIFFERENTIAL
HCT: 26 — AB (ref 36–46)
Hemoglobin: 8 — AB (ref 12.0–16.0)
Neutrophils Absolute: 4.1
Platelets: 259 (ref 150–399)
WBC: 7.4

## 2020-10-13 LAB — BASIC METABOLIC PANEL
BUN: 14 (ref 4–21)
CO2: 22 (ref 13–22)
Chloride: 99 (ref 99–108)
Creatinine: 0.8 (ref 0.5–1.1)
Glucose: 224
Potassium: 5.1 (ref 3.4–5.3)
Sodium: 135 — AB (ref 137–147)

## 2020-10-13 LAB — CBC: RBC: 2.98 — AB (ref 3.87–5.11)

## 2020-10-15 ENCOUNTER — Other Ambulatory Visit: Payer: Self-pay

## 2020-10-15 ENCOUNTER — Inpatient Hospital Stay: Payer: 59

## 2020-10-15 ENCOUNTER — Inpatient Hospital Stay: Payer: 59 | Attending: Hematology and Oncology | Admitting: Hematology and Oncology

## 2020-10-15 ENCOUNTER — Telehealth: Payer: Self-pay | Admitting: Hematology and Oncology

## 2020-10-15 VITALS — BP 141/68 | HR 86 | Temp 97.4°F | Resp 18 | Ht 66.0 in | Wt 223.4 lb

## 2020-10-15 VITALS — BP 147/65 | HR 83 | Resp 16

## 2020-10-15 DIAGNOSIS — Z79899 Other long term (current) drug therapy: Secondary | ICD-10-CM | POA: Insufficient documentation

## 2020-10-15 DIAGNOSIS — Z794 Long term (current) use of insulin: Secondary | ICD-10-CM | POA: Diagnosis not present

## 2020-10-15 DIAGNOSIS — D508 Other iron deficiency anemias: Secondary | ICD-10-CM

## 2020-10-15 DIAGNOSIS — D539 Nutritional anemia, unspecified: Secondary | ICD-10-CM

## 2020-10-15 DIAGNOSIS — Z7984 Long term (current) use of oral hypoglycemic drugs: Secondary | ICD-10-CM | POA: Insufficient documentation

## 2020-10-15 DIAGNOSIS — D509 Iron deficiency anemia, unspecified: Secondary | ICD-10-CM

## 2020-10-15 DIAGNOSIS — N183 Chronic kidney disease, stage 3 unspecified: Secondary | ICD-10-CM | POA: Insufficient documentation

## 2020-10-15 DIAGNOSIS — E1165 Type 2 diabetes mellitus with hyperglycemia: Secondary | ICD-10-CM | POA: Diagnosis not present

## 2020-10-15 LAB — CBC WITH DIFFERENTIAL/PLATELET
Abs Immature Granulocytes: 0.14 10*3/uL — ABNORMAL HIGH (ref 0.00–0.07)
Basophils Absolute: 0.1 10*3/uL (ref 0.0–0.1)
Basophils Relative: 1 %
Eosinophils Absolute: 0.5 10*3/uL (ref 0.0–0.5)
Eosinophils Relative: 5 %
HCT: 28.9 % — ABNORMAL LOW (ref 36.0–46.0)
Hemoglobin: 8.9 g/dL — ABNORMAL LOW (ref 12.0–15.0)
Immature Granulocytes: 1 %
Lymphocytes Relative: 24 %
Lymphs Abs: 2.7 10*3/uL (ref 0.7–4.0)
MCH: 27.7 pg (ref 26.0–34.0)
MCHC: 30.8 g/dL (ref 30.0–36.0)
MCV: 90 fL (ref 80.0–100.0)
Monocytes Absolute: 0.6 10*3/uL (ref 0.1–1.0)
Monocytes Relative: 6 %
Neutro Abs: 7.3 10*3/uL (ref 1.7–7.7)
Neutrophils Relative %: 63 %
Platelets: 321 10*3/uL (ref 150–400)
RBC: 3.21 MIL/uL — ABNORMAL LOW (ref 3.87–5.11)
RDW: 13.4 % (ref 11.5–15.5)
WBC: 11.3 10*3/uL — ABNORMAL HIGH (ref 4.0–10.5)
nRBC: 0 % (ref 0.0–0.2)

## 2020-10-15 LAB — COMPREHENSIVE METABOLIC PANEL
ALT: <6 U/L (ref 0–44)
AST: 12 U/L — ABNORMAL LOW (ref 15–41)
Albumin: 3.6 g/dL (ref 3.5–5.0)
Alkaline Phosphatase: 96 U/L (ref 38–126)
Anion gap: 12 (ref 5–15)
BUN: 17 mg/dL (ref 8–23)
CO2: 23 mmol/L (ref 22–32)
Calcium: 9.3 mg/dL (ref 8.9–10.3)
Chloride: 103 mmol/L (ref 98–111)
Creatinine, Ser: 1 mg/dL (ref 0.44–1.00)
GFR, Estimated: >60 mL/min (ref >60)
Glucose, Bld: 210 mg/dL — ABNORMAL HIGH (ref 70–99)
Potassium: 5.1 mmol/L (ref 3.5–5.1)
Sodium: 138 mmol/L (ref 135–145)
Total Bilirubin: 0.4 mg/dL (ref 0.3–1.2)
Total Protein: 7.1 g/dL (ref 6.5–8.1)

## 2020-10-15 LAB — SAMPLE TO BLOOD BANK

## 2020-10-15 MED ORDER — HEPARIN SOD (PORK) LOCK FLUSH 100 UNIT/ML IV SOLN
500.0000 [IU] | Freq: Once | INTRAVENOUS | Status: AC
  Administered 2020-10-15: 250 [IU] via INTRAVENOUS
  Filled 2020-10-15: qty 5

## 2020-10-15 MED ORDER — SODIUM CHLORIDE 0.9 % IV SOLN
Freq: Once | INTRAVENOUS | Status: AC
  Filled 2020-10-15: qty 250

## 2020-10-15 MED ORDER — SODIUM CHLORIDE 0.9% FLUSH
10.0000 mL | INTRAVENOUS | Status: DC | PRN
  Administered 2020-10-15: 10 mL via INTRAVENOUS
  Filled 2020-10-15: qty 10

## 2020-10-15 MED ORDER — SODIUM CHLORIDE 0.9 % IV SOLN
300.0000 mg | Freq: Once | INTRAVENOUS | Status: AC
  Administered 2020-10-15: 300 mg via INTRAVENOUS
  Filled 2020-10-15: qty 10

## 2020-10-15 NOTE — Patient Instructions (Signed)

## 2020-10-15 NOTE — Telephone Encounter (Signed)
Scheduled appts per 1/5 sch msg. Gave pt a print out of AVS.  

## 2020-10-16 ENCOUNTER — Encounter: Payer: Self-pay | Admitting: Hematology and Oncology

## 2020-10-16 NOTE — Assessment & Plan Note (Signed)
Her blood sugar is high She will continue medical management

## 2020-10-16 NOTE — Assessment & Plan Note (Signed)
She has intermittent chronic kidney disease secondary to cardiac issues and diabetes She will continue aggressive risk factor modification 

## 2020-10-16 NOTE — Progress Notes (Signed)
[facility] OFFICE PROGRESS NOTE  [name] Olp, MD  ASSESSMENT & PLAN:  Deficiency anemia I have reviewed blood work with the patient and her husband Overall, she has multifactorial anemia, combination of anemia of chronic disease related to bone marrow suppression from her antibiotics and a component of iron deficiency The most likely cause of her anemia is due to chronic blood loss/malabsorption syndrome. We discussed some of the risks, benefits, and alternatives of intravenous iron infusions. The patient is symptomatic from anemia and the iron level is critically low. She tolerated oral iron supplement poorly and desires to achieved higher levels of iron faster for adequate hematopoesis. Some of the side-effects to be expected including risks of infusion reactions, phlebitis, headaches, nausea and fatigue.  The patient is willing to proceed. Patient education material was dispensed.  Goal is to keep ferritin level greater than 50 and resolution of anemia We will proceed with 2 weekly dose of IV iron and then I plan to see her back within 6 to 8 weeks for repeat blood work and recheck   CKD (chronic kidney disease) stage 3, GFR 30-59 ml/min (HCC) She has intermittent chronic kidney disease secondary to cardiac issues and diabetes She will continue aggressive risk factor modification  Diabetes mellitus type 2, uncontrolled ([place]) Her blood sugar is high She will continue medical management   Orders Placed This Encounter  Procedures  . Iron and TIBC    Standing Status:   Standing    Number of Occurrences:   3    Standing Expiration Date:   10/16/2021  . Ferritin    Standing Status:   Standing    Number of Occurrences:   3    Standing Expiration Date:   10/16/2021    The total time spent in the appointment was 20 minutes encounter with patients including review of chart and various tests results, discussions about plan of care and coordination of care plan   All  questions were answered. The patient knows to call the clinic with any problems, questions or concerns. No barriers to learning was detected.    Heath Lark, MD 1/6/20229:31 AM  INTERVAL HISTORY: [name] 60 y.o. female returns for further follow-up on recent findings of severe anemia She is here accompanied by her husband The course of antibiotics is extended for 2 more weeks In the meantime, she is doing well except for excessive fatigue No recent fever or chills The patient denies any recent signs or symptoms of bleeding such as spontaneous epistaxis, hematuria or hematochezia.  SUMMARY OF HEMATOLOGIC HISTORY: [name] 60 y.o. female is here because of recent progressive anemia She is here accompanied by her husband I have the opportunity to review her blood test results from 2015 The patient has mild anemia over the last few years She has multiple chronic disease such as sarcoidosis, diabetes, congestive heart failure and others.   She has history of fundoplication.  She has multiple EGDs in the past, the last was in 2019 which showed no evidence of stomach ulcer or gastritis She takes chronic proton pump inhibitor Her hemoglobin stable between 10-11, most consistent with anemia of chronic disease  Around Halloween this year, she fell requiring left leg surgery.  Unfortunately, she developed complications with infection requiring IV antibiotics She also require blood transfusion intermittently According to her husband, advanced home care service has been drawing her blood from her PICC line and noted hemoglobin between 7.2-7.7 She has excessive fatigue This  past Sunday, she had postural dizziness leading to a fall but she denies significant injuries She denies recent chest pain on exertion but has been somewhat sedentary since her surgery She was on chronic prednisone therapy but that was discontinued in October.  She use inhalers. She denies shortness of breath on  minimal exertions or palpitations. She had not noticed any recent bleeding such as epistaxis, hematuria or hematochezia The patient denies over the counter NSAID ingestion. She is on antiplatelets agents and Lovenox since surgery  She had no prior history or diagnosis of cancer. Her age appropriate screening programs are up-to-date. She denies any pica and eats a variety of diet. She never donated blood  The patient was prescribed oral iron supplements and she takes 2 times a day and she tolerated that poorly.  She has significant nausea  I have reviewed the past medical history, past surgical history, social history and family history with the patient and they are unchanged from previous note.  ALLERGIES:  is allergic to methotrexate, vancomycin, lisinopril, chlorhexidine, clindamycin/lincomycin, doxycycline, and teflaro [ceftaroline].  MEDICATIONS:  Current Outpatient Medications  Medication Sig Dispense Refill  . albuterol (PROVENTIL HFA;VENTOLIN HFA) 108 (90 Base) MCG/ACT inhaler Inhale 1-2 puffs into the lungs every 6 (six) hours as needed for wheezing or shortness of breath. 8 g 5  . allopurinol (ZYLOPRIM) 100 MG tablet Take 100 mg by mouth every evening.     [name] aspirin EC 81 MG tablet Take 162 mg by mouth daily.     [name] atorvastatin (LIPITOR) 40 MG tablet TAKE 1 TABLET BY MOUTH EVERY DAY 90 tablet 3  . BREO ELLIPTA 200-25 MCG/INH AEPB TAKE 1 PUFF BY MOUTH EVERY DAY 60 each 5  . buPROPion (WELLBUTRIN XL) 300 MG 24 hr tablet TAKE 1 TABLET BY MOUTH EVERY DAY 90 tablet 3  . calcium carbonate (OS-CAL - DOSED IN MG OF ELEMENTAL CALCIUM) 1250 (500 Ca) MG tablet Take 1 tablet by mouth daily.    . carvedilol (COREG) 12.5 MG tablet Take 12.5 mg by mouth 2 (two) times daily with a meal.    . chlorpheniramine-HYDROcodone (TUSSIONEX PENNKINETIC ER) 10-8 MG/5ML SUER Take 5 mLs by mouth at bedtime as needed for cough. 70 mL 0  . Cholecalciferol 25 MCG (1000 UT) capsule Take 1,000 Units by mouth in the  morning and at bedtime.    . Colchicine 0.6 MG CAPS Take 0.6 mg by mouth 2 (two) times daily as needed for up to 4 days (may discontinue if symptoms improve). 20 capsule 1  . DEXILANT 60 MG capsule TAKE 1 CAPSULE BY MOUTH EVERY DAY 90 capsule 3  . diclofenac Sodium (VOLTAREN) 1 % GEL Apply 1 application topically as needed (pain.).     [name] diltiazem (CARDIZEM CD) 120 MG 24 hr capsule TAKE 1 CAPSULE BY MOUTH EVERY DAY 90 capsule 0  . Dulaglutide (TRULICITY) 3 LO/7.5IE SOPN Inject 0.5 mLs (3 mg total) into the skin once a week. 12 pen 3  . famotidine (PEPCID) 20 MG tablet Take 1 tablet (20 mg total) by mouth at bedtime. 30 tablet 3  . fenofibrate 160 MG tablet TAKE 1 TABLET BY MOUTH EVERY DAY 30 tablet 2  . fluticasone (FLONASE) 50 MCG/ACT nasal spray Place 2 sprays into both nostrils daily as needed for allergies. 48 mL 3  . furosemide (LASIX) 40 MG tablet TAKE 1 TABLET BY MOUTH TWICE A DAY 180 tablet 1  . gabapentin (NEURONTIN) 100 MG capsule TAKE 1 CAPSULE BY MOUTH THREE  TIMES A DAY 270 capsule 3  . glucose blood (CONTOUR NEXT TEST) test strip 1 each by Other route 4 (four) times daily. And lancets 4/day 400 each 3  . HYDROcodone-acetaminophen (NORCO/VICODIN) 5-325 MG tablet Take 1 tablet by mouth every 6 (six) hours as needed. 15 tablet 0  . insulin glargine (LANTUS SOLOSTAR) 100 UNIT/ML Solostar Pen Inject 25 Units into the skin every morning. 30 mL 2  . insulin lispro (HUMALOG KWIKPEN) 100 UNIT/ML KwikPen Inject 0.15 mLs (15 Units total) into the skin daily with supper. 15 mL 29  . Insulin Pen Needle (PEN NEEDLES) 32G X 4 MM MISC 1 each by Does not apply route 4 (four) times daily. E11.9 400 each 0  . isosorbide mononitrate (IMDUR) 30 MG 24 hr tablet TAKE 1 TABLET BY MOUTH EVERYDAY AT BEDTIME 90 tablet 0  . KLOR-CON M20 20 MEQ tablet TAKE 1 & 1/2 TABLETS BY MOUTH TWICE A DAY 270 tablet 2  . lactulose (CHRONULAC) 10 GM/15ML solution TAKE 15 MLS BY MOUTH DAILY AS NEEDED FOR CONSTIPATION. 1419 mL 1   . linaclotide (LINZESS) 290 MCG CAPS capsule Take by mouth.    [name] LORazepam (ATIVAN) 0.5 MG tablet Take 0.5 mg by mouth every 12 (twelve) hours as needed for anxiety.    . metFORMIN (GLUCOPHAGE) 1000 MG tablet Take 1 tablet (1,000 mg total) by mouth 2 (two) times daily with a meal. 180 tablet 1  . Microlet Lancets MISC 1 each by Does not apply route 2 (two) times daily. E11.9 100 each 2  . nitroGLYCERIN (NITROSTAT) 0.4 MG SL tablet Place 1 tablet (0.4 mg total) under the tongue every 5 (five) minutes as needed for chest pain. Reported on 01/05/2016 25 tablet 6  . ondansetron (ZOFRAN-ODT) 4 MG disintegrating tablet Take 1 tablet (4 mg total) by mouth every 8 (eight) hours as needed for nausea or vomiting. 50 tablet 2  . rifampin (RIFADIN) 300 MG capsule Take 300 mg by mouth 2 (two) times daily.    . Sodium Sulfate-Mag Sulfate-KCl (SUTAB) [phone] MG TABS Take 1 kit by mouth as directed. MANUFACTURER CODES!! BIN: [id number] PCN: CN GROUP: [id number] MEMBER ID: [id number];ACZ AS CASH;NO PRIOR AUTHORIZATION (Patient not taking: Reported on 10/06/2020) 24 tablet 0  . telmisartan (MICARDIS) 20 MG tablet TAKE 1 TABLET BY MOUTH EVERY DAY 90 tablet 1  . tiZANidine (ZANAFLEX) 4 MG tablet Take 0.5-1 tablets (2-4 mg total) by mouth every 8 (eight) hours as needed for muscle spasms. 30 tablet 1  . venlafaxine XR (EFFEXOR-XR) 75 MG 24 hr capsule TAKE 1 CAPSULE BY MOUTH TWICE A DAY 180 capsule 3   No current facility-administered medications for this visit.     REVIEW OF SYSTEMS:   Constitutional: Denies fevers, chills or night sweats Eyes: Denies blurriness of vision Ears, nose, mouth, throat, and face: Denies mucositis or sore throat Cardiovascular: Denies palpitation, chest discomfort or lower extremity swelling Gastrointestinal:  Denies nausea, heartburn or change in bowel habits Skin: Denies abnormal skin rashes Lymphatics: Denies new lymphadenopathy or easy bruising Neurological:Denies numbness,  tingling or new weaknesses Behavioral/Psych: Mood is stable, no new changes  All other systems were reviewed with the patient and are negative.  PHYSICAL EXAMINATION: ECOG PERFORMANCE STATUS: 2 - Symptomatic, <50% confined to bed  Vitals:   10/15/20 1235  BP: (!) 141/68  Pulse: 86  Resp: 18  Temp: (!) 97.4 F (36.3 C)  SpO2: 100%   Filed Weights   10/15/20 1235  Weight: 223 lb 6.4 oz (  101.3 kg)    GENERAL:alert, no distress and comfortable NEURO: alert & oriented x 3 with fluent speech, no focal motor/sensory deficits  LABORATORY DATA:  I have reviewed the data as listed     Component Value Date/Time   NA 138 10/15/2020 1148   NA 135 (A) 10/13/2020 0000   NA 135 (L) 04/17/2014 1039   K 5.1 10/15/2020 1148   K 4.5 04/17/2014 1039   CL 103 10/15/2020 1148   CO2 23 10/15/2020 1148   CO2 26 04/17/2014 1039   GLUCOSE 210 (H) 10/15/2020 1148   GLUCOSE 300 (H) 04/17/2014 1039   GLUCOSE 150 (H) 09/14/2006 1033   BUN 17 10/15/2020 1148   BUN 14 10/13/2020 0000   BUN 13.9 04/17/2014 1039   CREATININE 1.00 10/15/2020 1148   CREATININE 0.88 08/21/2020 1404   CREATININE 0.8 04/17/2014 1039   CALCIUM 9.3 10/15/2020 1148   CALCIUM 10.0 04/17/2014 1039   PROT 7.1 10/15/2020 1148   PROT 6.7 03/31/2020 1316   PROT 6.8 04/17/2014 1039   ALBUMIN 3.6 10/15/2020 1148   ALBUMIN 4.6 03/31/2020 1316   ALBUMIN 3.7 04/17/2014 1039   AST 12 (L) 10/15/2020 1148   AST 8 04/17/2014 1039   ALT <6 10/15/2020 1148   ALT 20 04/17/2014 1039   ALKPHOS 96 10/15/2020 1148   ALKPHOS 82 04/17/2014 1039   BILITOT 0.4 10/15/2020 1148   BILITOT 0.3 03/31/2020 1316   BILITOT 0.45 04/17/2014 1039   GFRNONAA >60 10/15/2020 1148   GFRNONAA 71 08/21/2020 1404   GFRAA 87 10/13/2020 0000   GFRAA 82 08/21/2020 1404    No results found for: SPEP, UPEP  Lab Results  Component Value Date   WBC 11.3 (H) 10/15/2020   NEUTROABS 7.3 10/15/2020   HGB 8.9 (L) 10/15/2020   HCT 28.9 (L) 10/15/2020    MCV 90.0 10/15/2020   PLT 321 10/15/2020      Chemistry      Component Value Date/Time   NA 138 10/15/2020 1148   NA 135 (A) 10/13/2020 0000   NA 135 (L) 04/17/2014 1039   K 5.1 10/15/2020 1148   K 4.5 04/17/2014 1039   CL 103 10/15/2020 1148   CO2 23 10/15/2020 1148   CO2 26 04/17/2014 1039   BUN 17 10/15/2020 1148   BUN 14 10/13/2020 0000   BUN 13.9 04/17/2014 1039   CREATININE 1.00 10/15/2020 1148   CREATININE 0.88 08/21/2020 1404   CREATININE 0.8 04/17/2014 1039   GLU 224 10/13/2020 0000      Component Value Date/Time   CALCIUM 9.3 10/15/2020 1148   CALCIUM 10.0 04/17/2014 1039   ALKPHOS 96 10/15/2020 1148   ALKPHOS 82 04/17/2014 1039   AST 12 (L) 10/15/2020 1148   AST 8 04/17/2014 1039   ALT <6 10/15/2020 1148   ALT 20 04/17/2014 1039   BILITOT 0.4 10/15/2020 1148   BILITOT 0.3 03/31/2020 1316   BILITOT 0.45 04/17/2014 1039

## 2020-10-16 NOTE — Assessment & Plan Note (Signed)
I have reviewed blood work with the patient and her husband Overall, she has multifactorial anemia, combination of anemia of chronic disease related to bone marrow suppression from her antibiotics and a component of iron deficiency The most likely cause of her anemia is due to chronic blood loss/malabsorption syndrome. We discussed some of the risks, benefits, and alternatives of intravenous iron infusions. The patient is symptomatic from anemia and the iron level is critically low. She tolerated oral iron supplement poorly and desires to achieved higher levels of iron faster for adequate hematopoesis. Some of the side-effects to be expected including risks of infusion reactions, phlebitis, headaches, nausea and fatigue.  The patient is willing to proceed. Patient education material was dispensed.  Goal is to keep ferritin level greater than 50 and resolution of anemia We will proceed with 2 weekly dose of IV iron and then I plan to see her back within 6 to 8 weeks for repeat blood work and recheck

## 2020-10-20 ENCOUNTER — Other Ambulatory Visit: Payer: Self-pay

## 2020-10-20 ENCOUNTER — Telehealth: Payer: Self-pay

## 2020-10-20 ENCOUNTER — Inpatient Hospital Stay: Payer: 59

## 2020-10-20 VITALS — BP 146/70 | HR 79 | Temp 98.3°F | Resp 18

## 2020-10-20 DIAGNOSIS — Z794 Long term (current) use of insulin: Secondary | ICD-10-CM | POA: Diagnosis not present

## 2020-10-20 DIAGNOSIS — E1165 Type 2 diabetes mellitus with hyperglycemia: Secondary | ICD-10-CM | POA: Diagnosis not present

## 2020-10-20 DIAGNOSIS — D509 Iron deficiency anemia, unspecified: Secondary | ICD-10-CM | POA: Diagnosis not present

## 2020-10-20 DIAGNOSIS — Z7984 Long term (current) use of oral hypoglycemic drugs: Secondary | ICD-10-CM | POA: Diagnosis not present

## 2020-10-20 DIAGNOSIS — Z79899 Other long term (current) drug therapy: Secondary | ICD-10-CM | POA: Diagnosis not present

## 2020-10-20 DIAGNOSIS — N183 Chronic kidney disease, stage 3 unspecified: Secondary | ICD-10-CM | POA: Diagnosis not present

## 2020-10-20 LAB — CBC: RBC: 2.94 — AB (ref 3.87–5.11)

## 2020-10-20 LAB — HEPATIC FUNCTION PANEL
ALT: 6 — AB (ref 7–35)
AST: 17 (ref 13–35)
Alkaline Phosphatase: 109 (ref 25–125)
Bilirubin, Total: 0.2

## 2020-10-20 LAB — COMPREHENSIVE METABOLIC PANEL WITH GFR
Albumin: 3.7 (ref 3.5–5.0)
Calcium: 8.8 (ref 8.7–10.7)
GFR calc Af Amer: 96
GFR calc non Af Amer: 84
Globulin: 2

## 2020-10-20 LAB — BASIC METABOLIC PANEL WITH GFR
BUN: 10 (ref 4–21)
CO2: 21 (ref 13–22)
Chloride: 104 (ref 99–108)
Creatinine: 0.8 (ref 0.5–1.1)
Glucose: 223
Potassium: 4.5 (ref 3.4–5.3)
Sodium: 139 (ref 137–147)

## 2020-10-20 MED ORDER — SODIUM CHLORIDE 0.9 % IV SOLN
300.0000 mg | Freq: Once | INTRAVENOUS | Status: AC
  Administered 2020-10-20: 300 mg via INTRAVENOUS
  Filled 2020-10-20: qty 5

## 2020-10-20 MED ORDER — HEPARIN SOD (PORK) LOCK FLUSH 100 UNIT/ML IV SOLN
250.0000 [IU] | Freq: Once | INTRAVENOUS | Status: AC
  Administered 2020-10-20: 250 [IU] via INTRAVENOUS
  Filled 2020-10-20: qty 5

## 2020-10-20 MED ORDER — SODIUM CHLORIDE 0.9% FLUSH
3.0000 mL | INTRAVENOUS | Status: DC | PRN
  Administered 2020-10-20: 3 mL via INTRAVENOUS
  Filled 2020-10-20: qty 10

## 2020-10-20 MED ORDER — SODIUM CHLORIDE 0.9 % IV SOLN
INTRAVENOUS | Status: DC
  Filled 2020-10-20: qty 250

## 2020-10-20 NOTE — Patient Instructions (Signed)

## 2020-10-20 NOTE — Progress Notes (Signed)
Phone [phone] Virtual visit via Video note   Subjective:  Chief complaint: Chief Complaint  Patient presents with  . Cough    Patient has been tested within the last 2 weeks and she states that it was negative.  . Nasal Congestion    Patient has been tested within the last 2 weeks and she states that it was negative.   . post nasal drip    And she wakes up hoarse    This visit type was conducted due to national recommendations for restrictions regarding the COVID-19 Pandemic (e.g. social distancing).  This format is felt to be most appropriate for this patient at this time balancing risks to patient and risks to population by having him in for in person visit.  No physical exam was performed (except for noted visual exam or audio findings with Telehealth visits).    Our team/I connected with [name] at  2:20 PM EST by a video enabled telemedicine application (doxy.me or caregility through epic) and verified that I am speaking with the correct person using two identifiers.  Location patient: [facility] Location provider: [facility], office Persons participating in the virtual visit:  patient  Our team/I discussed the limitations of evaluation and management by telemedicine and the availability of in person appointments. In light of current covid-19 pandemic, patient also understands that we are trying to protect them by minimizing in office contact if at all possible.  The patient expressed consent for telemedicine visit and agreed to proceed. Patient understands insurance will be billed.   Past Medical History-  Patient Active Problem List   Diagnosis Date Noted  . Severe persistent asthma 01/04/2019    Priority: High  . Subcortical microvascular ischemic occlusive disease 07/12/2018    Priority: High  . Obstructive chronic bronchitis without exacerbation ([place]) 09/18/2013    Priority: High  . Chest pain 04/11/2013    Priority: High  . Hypertensive heart disease with  chronic diastolic congestive heart failure ([place])     Priority: High  . Sarcoidosis of lung ([place]) 04/10/2007    Priority: High  . Type II diabetes mellitus with renal manifestations ([place]) 08/21/2006    Priority: High  . Hypertriglyceridemia 04/02/2020    Priority: Medium  . Aortic atherosclerosis ([place]) 02/14/2020    Priority: Medium  . Constipation 04/21/2018    Priority: Medium  . Epiglottitis     Priority: Medium  . Osteoarthritis of right knee 01/26/2017    Priority: Medium  . CKD (chronic kidney disease) stage 3, GFR 30-59 ml/min (HCC) 04/27/2015    Priority: Medium  . History of osteomyelitis 03/27/2015    Priority: Medium  . Fatty liver 09/30/2014    Priority: Medium  . Major depression in full remission ([place])     Priority: Medium  . Gout 08/20/2010    Priority: Medium  . Deficiency anemia 09/18/2009    Priority: Medium  . Sleep apnea 04/21/2009    Priority: Medium  . Hyperlipidemia associated with type 2 diabetes mellitus ([place]) 08/21/2006    Priority: Medium  . Hypertension associated with diabetes ([place]) 08/21/2006    Priority: Medium  . Nontraumatic complete tear of left rotator cuff     Priority: Low  . Diabetic neuropathy ([place]) 02/20/2019    Priority: Low  . Rapid palpitations 07/12/2018    Priority: Low  . Impingement of left ankle joint 06/26/2018    Priority: Low  . Total knee replacement status, left 12/07/2017    Priority: Low  .  Dysphagia 09/20/2017    Priority: Low  . Plantar fasciitis, right 07/13/2017    Priority: Low  . S/P total knee arthroplasty, right 06/29/2017    Priority: Low  . Osteoarthrosis, localized, primary, knee, right     Priority: Low  . Sprain of calcaneofibular ligament of right ankle 06/06/2017    Priority: Low  . Onychomycosis 10/27/2015    Priority: Low  . MRSA (methicillin resistant staph aureus) culture positive 03/27/2015    Priority: Low  . Hot flashes 07/15/2014    Priority: Low  . Abnormal SPEP 04/17/2014     Priority: Low  . Fracture of left leg 04/17/2014    Priority: Low  . Cushingoid side effect of steroids ([place]) 04/17/2014    Priority: Low  . Internal hemorrhoids     Priority: Low  . Preoperative clearance 03/25/2014    Priority: Low  . Solitary pulmonary nodule, on CT 02/2013 - stable over 2 years in 2015 02/20/2013    Priority: Low  . Morbid obesity ([place]) 06/04/2009    Priority: Low  . GERD 08/21/2006    Priority: Low  . Iron deficiency anemia 10/01/2020  . Postural dizziness with presyncope 09/30/2020  . Pulmonary sarcoidosis (HCC)     Medications- reviewed and updated Current Outpatient Medications  Medication Sig Dispense Refill  . albuterol (PROVENTIL HFA;VENTOLIN HFA) 108 (90 Base) MCG/ACT inhaler Inhale 1-2 puffs into the lungs every 6 (six) hours as needed for wheezing or shortness of breath. 8 g 5  . allopurinol (ZYLOPRIM) 100 MG tablet Take 100 mg by mouth every evening.     [name] amoxicillin-clavulanate (AUGMENTIN) 875-125 MG tablet Take 1 tablet by mouth 2 (two) times daily for 7 days. 14 tablet 0  . aspirin EC 81 MG tablet Take 162 mg by mouth daily.     [name] atorvastatin (LIPITOR) 40 MG tablet TAKE 1 TABLET BY MOUTH EVERY DAY 90 tablet 3  . BREO ELLIPTA 200-25 MCG/INH AEPB TAKE 1 PUFF BY MOUTH EVERY DAY 60 each 5  . buPROPion (WELLBUTRIN XL) 300 MG 24 hr tablet TAKE 1 TABLET BY MOUTH EVERY DAY 90 tablet 3  . calcium carbonate (OS-CAL - DOSED IN MG OF ELEMENTAL CALCIUM) 1250 (500 Ca) MG tablet Take 1 tablet by mouth daily.    . carvedilol (COREG) 12.5 MG tablet Take 12.5 mg by mouth 2 (two) times daily with a meal.    . chlorpheniramine-HYDROcodone (TUSSIONEX PENNKINETIC ER) 10-8 MG/5ML SUER Take 5 mLs by mouth at bedtime as needed for cough. 70 mL 0  . Cholecalciferol 25 MCG (1000 UT) capsule Take 1,000 Units by mouth in the morning and at bedtime.    [name] DEXILANT 60 MG capsule TAKE 1 CAPSULE BY MOUTH EVERY DAY 90 capsule 3  . diclofenac Sodium (VOLTAREN) 1 % GEL Apply 1  application topically as needed (pain.).     [name] diltiazem (CARDIZEM CD) 120 MG 24 hr capsule TAKE 1 CAPSULE BY MOUTH EVERY DAY 90 capsule 0  . Dulaglutide (TRULICITY) 3 ZY/6.0YT SOPN Inject 0.5 mLs (3 mg total) into the skin once a week. 12 pen 3  . famotidine (PEPCID) 20 MG tablet Take 1 tablet (20 mg total) by mouth at bedtime. 30 tablet 3  . fenofibrate 160 MG tablet TAKE 1 TABLET BY MOUTH EVERY DAY 30 tablet 2  . fluticasone (FLONASE) 50 MCG/ACT nasal spray Place 2 sprays into both nostrils daily as needed for allergies. 48 mL 3  . furosemide (LASIX) 40 MG tablet TAKE 1  TABLET BY MOUTH TWICE A DAY 180 tablet 1  . gabapentin (NEURONTIN) 100 MG capsule TAKE 1 CAPSULE BY MOUTH THREE TIMES A DAY 270 capsule 3  . glucose blood (CONTOUR NEXT TEST) test strip 1 each by Other route 4 (four) times daily. And lancets 4/day 400 each 3  . HYDROcodone-acetaminophen (NORCO/VICODIN) 5-325 MG tablet Take 1 tablet by mouth every 6 (six) hours as needed. 15 tablet 0  . insulin glargine (LANTUS SOLOSTAR) 100 UNIT/ML Solostar Pen Inject 25 Units into the skin every morning. 30 mL 2  . insulin lispro (HUMALOG KWIKPEN) 100 UNIT/ML KwikPen Inject 0.15 mLs (15 Units total) into the skin daily with supper. 15 mL 29  . Insulin Pen Needle (PEN NEEDLES) 32G X 4 MM MISC 1 each by Does not apply route 4 (four) times daily. E11.9 400 each 0  . isosorbide mononitrate (IMDUR) 30 MG 24 hr tablet TAKE 1 TABLET BY MOUTH EVERYDAY AT BEDTIME 90 tablet 0  . KLOR-CON M20 20 MEQ tablet TAKE 1 & 1/2 TABLETS BY MOUTH TWICE A DAY 270 tablet 2  . lactulose (CHRONULAC) 10 GM/15ML solution TAKE 15 MLS BY MOUTH DAILY AS NEEDED FOR CONSTIPATION. 1419 mL 1  . linaclotide (LINZESS) 290 MCG CAPS capsule Take by mouth.    [name] LORazepam (ATIVAN) 0.5 MG tablet Take 0.5 mg by mouth every 12 (twelve) hours as needed for anxiety.    . metFORMIN (GLUCOPHAGE) 1000 MG tablet Take 1 tablet (1,000 mg total) by mouth 2 (two) times daily with a meal. 180 tablet  1  . Microlet Lancets MISC 1 each by Does not apply route 2 (two) times daily. E11.9 100 each 2  . nitroGLYCERIN (NITROSTAT) 0.4 MG SL tablet Place 1 tablet (0.4 mg total) under the tongue every 5 (five) minutes as needed for chest pain. Reported on 01/05/2016 25 tablet 6  . ondansetron (ZOFRAN-ODT) 4 MG disintegrating tablet Take 1 tablet (4 mg total) by mouth every 8 (eight) hours as needed for nausea or vomiting. 50 tablet 2  . rifampin (RIFADIN) 300 MG capsule Take 300 mg by mouth 2 (two) times daily.    . Sodium Sulfate-Mag Sulfate-KCl (SUTAB) [phone] MG TABS Take 1 kit by mouth as directed. MANUFACTURER CODES!! BIN: [id number] PCN: CN GROUP: [id number] MEMBER ID: [id number];MGQ AS CASH;NO PRIOR AUTHORIZATION 24 tablet 0  . telmisartan (MICARDIS) 20 MG tablet TAKE 1 TABLET BY MOUTH EVERY DAY 90 tablet 1  . tiZANidine (ZANAFLEX) 4 MG tablet Take 0.5-1 tablets (2-4 mg total) by mouth every 8 (eight) hours as needed for muscle spasms. 30 tablet 1  . venlafaxine XR (EFFEXOR-XR) 75 MG 24 hr capsule TAKE 1 CAPSULE BY MOUTH TWICE A DAY 180 capsule 3  . Colchicine 0.6 MG CAPS Take 0.6 mg by mouth 2 (two) times daily as needed for up to 4 days (may discontinue if symptoms improve). 20 capsule 1   No current facility-administered medications for this visit.     Objective:  BP 130/61   Pulse 79   Temp (!) 97 F (36.1 C) (Temporal)   Ht 5' 6"  (1.676 m)   Wt 218 lb (98.9 kg)   SpO2 99%   BMI 35.19 kg/m  self reported vitals Gen: NAD, resting comfortably Points to maxillary and frontal sinuses bilaterally as source of pain Lungs: nonlabored, normal respiratory rate  Skin: appears dry, no obvious rash     Assessment and Plan   Cough/ Congestion S:Saw Inda Coke by video visit on 10/06/20 with cough,  runny nose. She was negative for covid testing. She was already on cefazolin 2 g TID and rifampin 368m BID at that time. Off or prrednisone per orthopedics (though she was told could restart  at this point if needed)  Refill on tussionex given. No antibiotics given. She is on picc line still with cefazolin only. Off rifampin bc felt ill on this. They did not restart prednisone at that time.   Patient has ongoing cough and nasal congestion. No chest pain or shortness of breath. Home health nurse said lungs were clear. She has frontal and maxillary sinus pressure as well as a headache. Symptoms cant seem to be shaken but also not worsening. Also wakes up hoars hoarse in the morninge in AM.    Labs yesterday- cbc and cmp pretty stable- stable anemia around 8.   Off prednisone since 28th of October. Breathing issues not worse- perhaps more achy off prednisone- [facility] A/P: 60year old female with history of sarcoidosis of the lung as well as history of sinusitis.  Thankfully she has done well since October being off prednisone with minimal respiratory issues or sinus issues- started with cough, nasal congestion in late December.  Possible being on cefazolin has suppressed sinus infections but it is certainly not clearing current infection.  Symptoms for nearly 3 weeks at this point.  I would like to start her on Augmentin which she has done very well with in the past but I am hesitant to start also on cefazolin-she finishes cefazolin on Friday and will start Augmentin after that.  For sarcoidosis-I [facility] restart[facility] pred[facility] or remaining off since overall she seems to have done well.  We would like the expert opinion of pulmonology on this  Recommended follow up: We will keep February visit or sooner if needed if fails to improve Future Appointments  Date Time Provider DPena 11/03/2020  4:20 PM HLeonie Man MD [facility] 11/18/2020  8:45 AM ERenato Shin MD [facility] None  11/27/2020  1:40 PM HMarin Olp MD [facility] PEC  12/03/2020  9:45 AM [facility] LAB  [facility] None  12/03/2020 10:20 AM GHeath Lark MD [facility] None  12/03/2020 11:00 AM [facility] INFUSION [facility] None    Lab/Order associations:   ICD-10-CM   1. Sarcoidosis of lung ([place]  D86.0   2. Bacterial sinusitis  J32.9    B96.89     Meds ordered this encounter  Medications  . amoxicillin-clavulanate (AUGMENTIN) 875-125 MG tablet    Sig: Take 1 tablet by mouth 2 (two) times daily for 7 days.    Dispense:  14 tablet    Refill:  0    Return precautions advised.  SGarret Reddish MD

## 2020-10-20 NOTE — Patient Instructions (Addendum)
  Health Maintenance Due  Topic Date Due  . COLONOSCOPY (Pts 45-7yrs Insurance coverage will need to be confirmed)  04/29/2020  . COVID-19 Vaccine (3 - Booster for [employer] series) Patient states once she gets over whatever she has she will schedule an appointment to receive the booster.  07/17/2020    Depression screen PHQ 2/9 10/21/2020 08/21/2020 04/07/2020  Decreased Interest 0 0 0  Down, Depressed, Hopeless 0 0 0  PHQ - 2 Score 0 0 0  Altered sleeping - 0 0  Tired, decreased energy - 0 0  Change in appetite - 0 0  Feeling bad or failure about yourself  - 0 0  Trouble concentrating - 0 0  Moving slowly or fidgety/restless - 0 0  Suicidal thoughts - 0 0  PHQ-9 Score - 0 0  Difficult doing work/chores - - Not difficult at all  Some recent data might be hidden    Recommended follow up: No follow-ups on file.

## 2020-10-20 NOTE — Telephone Encounter (Signed)
[name] is calling in asking for a continuance of Home Health for 4 more weeks, asked for a call back.

## 2020-10-21 ENCOUNTER — Encounter: Payer: Self-pay | Admitting: Family Medicine

## 2020-10-21 ENCOUNTER — Telehealth (INDEPENDENT_AMBULATORY_CARE_PROVIDER_SITE_OTHER): Payer: 59 | Admitting: Family Medicine

## 2020-10-21 VITALS — BP 130/61 | HR 79 | Temp 97.0°F | Ht 66.0 in | Wt 218.0 lb

## 2020-10-21 DIAGNOSIS — J329 Chronic sinusitis, unspecified: Secondary | ICD-10-CM | POA: Diagnosis not present

## 2020-10-21 DIAGNOSIS — D860 Sarcoidosis of lung: Secondary | ICD-10-CM | POA: Diagnosis not present

## 2020-10-21 DIAGNOSIS — B9689 Other specified bacterial agents as the cause of diseases classified elsewhere: Secondary | ICD-10-CM

## 2020-10-21 LAB — CBC AND DIFFERENTIAL
HCT: 26 — AB (ref 36–46)
Hemoglobin: 7.9 — AB (ref 12.0–16.0)
Neutrophils Absolute: 3.2
Platelets: 257 (ref 150–399)
WBC: 5.8

## 2020-10-21 MED ORDER — AMOXICILLIN-POT CLAVULANATE 875-125 MG PO TABS: 1.0000 | ORAL_TABLET | Freq: Two times a day (BID) | ORAL | 0 refills | Status: AC

## 2020-10-21 NOTE — Telephone Encounter (Signed)
I called the number listed for [name] and that was not a correct number.

## 2020-10-22 DIAGNOSIS — Z452 Encounter for adjustment and management of vascular access device: Secondary | ICD-10-CM | POA: Diagnosis not present

## 2020-10-22 DIAGNOSIS — Z792 Long term (current) use of antibiotics: Secondary | ICD-10-CM | POA: Diagnosis not present

## 2020-10-22 DIAGNOSIS — T8142XD Infection following a procedure, deep incisional surgical site, subsequent encounter: Secondary | ICD-10-CM | POA: Diagnosis not present

## 2020-10-22 DIAGNOSIS — A4901 Methicillin susceptible Staphylococcus aureus infection, unspecified site: Secondary | ICD-10-CM | POA: Diagnosis not present

## 2020-10-22 DIAGNOSIS — R7982 Elevated C-reactive protein (CRP): Secondary | ICD-10-CM | POA: Diagnosis not present

## 2020-10-22 DIAGNOSIS — E119 Type 2 diabetes mellitus without complications: Secondary | ICD-10-CM | POA: Diagnosis not present

## 2020-10-22 DIAGNOSIS — Z794 Long term (current) use of insulin: Secondary | ICD-10-CM | POA: Diagnosis not present

## 2020-10-23 ENCOUNTER — Other Ambulatory Visit: Payer: Self-pay

## 2020-10-23 ENCOUNTER — Telehealth: Payer: Self-pay

## 2020-10-23 DIAGNOSIS — R531 Weakness: Secondary | ICD-10-CM

## 2020-10-23 NOTE — Telephone Encounter (Signed)
[name] from [facility] called stating pt is now done with home health services. Her last day with home health PT was yesterday. [name] asked if Dr. Yong Channel would put in an outpatient referral for pt to start receiving physical therapy. Please advise.

## 2020-10-23 NOTE — Telephone Encounter (Signed)
You may refer to physical therapy under generalized weakness-after this is placed patient should be able to call and schedule visit at her preference

## 2020-10-23 NOTE — Telephone Encounter (Signed)
Review below.

## 2020-10-23 NOTE — Telephone Encounter (Signed)
Patient is aware that I have placed the referral and she will schedule an appointment with lauren.

## 2020-11-03 ENCOUNTER — Encounter: Payer: Self-pay | Admitting: Cardiology

## 2020-11-03 ENCOUNTER — Encounter: Payer: Self-pay | Admitting: Physical Therapy

## 2020-11-03 ENCOUNTER — Ambulatory Visit (INDEPENDENT_AMBULATORY_CARE_PROVIDER_SITE_OTHER): Payer: 59 | Admitting: Physical Therapy

## 2020-11-03 ENCOUNTER — Other Ambulatory Visit: Payer: Self-pay

## 2020-11-03 ENCOUNTER — Ambulatory Visit (INDEPENDENT_AMBULATORY_CARE_PROVIDER_SITE_OTHER): Payer: 59 | Admitting: Cardiology

## 2020-11-03 VITALS — BP 123/78 | HR 74 | Ht 66.0 in | Wt 221.2 lb

## 2020-11-03 DIAGNOSIS — E1169 Type 2 diabetes mellitus with other specified complication: Secondary | ICD-10-CM

## 2020-11-03 DIAGNOSIS — I5032 Chronic diastolic (congestive) heart failure: Secondary | ICD-10-CM | POA: Diagnosis not present

## 2020-11-03 DIAGNOSIS — M79605 Pain in left leg: Secondary | ICD-10-CM

## 2020-11-03 DIAGNOSIS — M25662 Stiffness of left knee, not elsewhere classified: Secondary | ICD-10-CM | POA: Diagnosis not present

## 2020-11-03 DIAGNOSIS — E785 Hyperlipidemia, unspecified: Secondary | ICD-10-CM

## 2020-11-03 DIAGNOSIS — R42 Dizziness and giddiness: Secondary | ICD-10-CM | POA: Diagnosis not present

## 2020-11-03 DIAGNOSIS — R55 Syncope and collapse: Secondary | ICD-10-CM

## 2020-11-03 DIAGNOSIS — R002 Palpitations: Secondary | ICD-10-CM

## 2020-11-03 DIAGNOSIS — M6281 Muscle weakness (generalized): Secondary | ICD-10-CM

## 2020-11-03 DIAGNOSIS — I110 Hypertensive heart disease with heart failure: Secondary | ICD-10-CM | POA: Diagnosis not present

## 2020-11-03 MED ORDER — ASPIRIN EC 81 MG PO TBEC: 81.0000 mg | DELAYED_RELEASE_TABLET | Freq: Every day | ORAL | 11 refills | Status: AC

## 2020-11-03 NOTE — Progress Notes (Signed)
Primary Care Provider: [name], MD Cardiologist: [name] Hew, MD Electrophysiologist: None  Clinic Note: Chief Complaint  Patient presents with  . Follow-up    Stable pretty much from a cardiac standpoint.  Palpitations under control no active heart failure  . Hospitalization Follow-up    Back to back hospitalizations after femoral fracture October and November of 2021    ===================================  ASSESSMENT/PLAN   Problem List Items Addressed This Visit    Hypertensive heart disease with chronic diastolic congestive heart failure ([place]) - Primary (Chronic)    Stable, doing well.  Trivial edema.  She is now taking her furosemide once daily and doing relatively well with it. Blood pressure is well controlled on carvedilol and telmisartan along with diltiazem. ==> Okay to stop Imdur  She is on GLP-1 agonist for her diabetes to help with weight loss, question if she would potentially benefit from SGLT two inhibitor for additional fluid management.-Defer to PCP.[name]  We discussed sliding scale Lasix the where she can take an act extra dose of Lasix if necessary based on weight gain, worsening dyspnea/PND/orthopnea or edema.      Relevant Medications   aspirin EC 81 MG tablet   Other Relevant Orders   EKG 12-Lead (Completed)   Rapid palpitations (Chronic)    Palpitations seem to be pretty well controlled with a combination of carvedilol and low-dose diltiazem.  Diltiazem is being used to help with potential treatment for pulmonary hypertension.      Hyperlipidemia associated with type 2 diabetes mellitus ([place])    Most recent lipid panel from June shows her LDL weight and 84.  Back in the right direction.  She remains on multiple medications including atorvastatin along with fenofibrate (for TG).  I had plan to switch her over to Vascepa, but apparently this was not covered by insurance therefore she was placed back on fenofibrate.  [facility] medication-actually  the only drug with indication for hyperlipidemia triglyceridemia with cardiovascular benefit, but not covered by insurance)  Her triglycerides are still high, but not as bad with type of dietary modification.  If this episodes not covered, could please try to consider over-the-counter high-dose fish oil (3 g a day).      Relevant Medications   aspirin EC 81 MG tablet   Postural dizziness with presyncope    There was concerned about possible fall from orthostatic hypotension postural dizziness.  This was back in December 2021.  She says she is also reporting going down the stairs.  But her diuretic dose was reduced to once daily.  We also discussed importance of staying adequately hydrated.  If she does feel lightheaded and dizzy, she is okay to hold which ever antihypertensive agent is up next course of the day.      Relevant Medications   aspirin EC 81 MG tablet     ===================================  HPI:    [name] is a 60 y.o. obese female with a PMH notable for Sarcoidosis (on prednisone), DM-2(on insulin), HLD, HTN with mild HFpEF, and possible COPD who presents today for annual follow-up.   RIGHT AND LEFT HEART CATH in 2014 --->angiographic normal coronary arteries with essentially normal right heart pressures..   Follow-up cath in 2018 -angiographically normal with mild nonobstructive disease in the LAD. Normal LVEDP.  [name] was last seen in November 2020-feeling relatively well.  Looked and felt younger.  More energy.  Unfortunate she had a recent fall with arm fracture.  Was then pending hammertoe  surgery in January.  PVD exacerbations.  Occasional palpitations.  Recent Hospitalizations:   02/11/2020: ER visit for abdominal/precordial chest pain => early epigastric pain radiating up the chest wall may be also some pressure sensation rating to the left arm.  Delta troponin negative.  Started on Protonix  07/07/2020-[facility] with cough,  hoarseness and congestion with recent Covid exposure.  Covid negative  08/07/2020: [facility] -> presented with left thigh and knee pain after mechanical fall, down stairs.  She missed last step and then on her leg.  No LOC.  Excruciating pain.  Left femur x-ray showed comminuted distal femoral periprosthetic fracture with displacement, and angulation.  (Has history of left knee replacement) => no cardiac issues  08/08/2020-ORIF of left extra-articular periprosthetic distal femur fracture with synthesis of the femur plate with cortical nonlocking screws.  (Thought to be pathologic fracture due to osteoporosis)  Postop anemia requiring surgery to improve hemoglobin from 6.8-8.1 on discharge.  Discharged on three times a day iron after hospitalization.  FOBT negative.  Plan was to change iron to three times a week. => She was restarted on her Lasix with potassium supplementation. =>  08/29/2020: Admitted to [facility] [place]-displaced comminuted fracture of the left femur shaft-prosthetic fracture around internal prosthetic left knee => her medial plate fixation screws had backed out.  ORIF of femur fracture revision -> concern for possible infected prosthesis.  Was placed on IV antibiotics converted to daptomycin, cultures grew out MSSA-converted to cefazolin and rifampin twice daily. -->  Discharged on IV cefazolin with PICC line placed.  Reviewed  CV studies:    The following studies were reviewed today: (if available, images/films reviewed: From Epic Chart or Care Everywhere) . Echo (08/12/2020, [place]): Normal LV size and function.  EF 55 to 60%.  No RWM A.  Normal filling pattern.  Normal RV.  No significant valvular disease-stenosis or regurgitation.  No vegetation.  Interval History:   [name] presents here today for follow-up that is now a delayed annual follow-up because of her femur fracture issues.  She was recently seen by [name] for sinusitis  symptoms that seem to have cleared up.  She says that she is actually doing better, but just is limited as far as mobility because of her knee surgery.  Her shortness of breath is pretty stable but does have some occasional wheezing.  She noticed it worse when she was more anemic.  She is due to see Dr. Casper Harrison soon at [facility] for pulmonary follow-up of her sarcoid.  She tells me that her palpitations are pretty well controlled now-no significant episodes of sustained complications or fast heartbeats..  She is not having any chest pain or pressure.  She has orthopnea but that is probably more because of her body habitus but denies any PND.  Edema is present but not significant.  More on the operated leg of the nonoperative leg.   She does acknowledge being pretty sedentary since her fall and fracture.  She was just starting to get herself back into the routine of doing things.  The surgeon had her increase her aspirin up to 2 tablets a day and she is not had any bleeding issues.  CV Review of Symptoms (Summary): positive for - dyspnea on exertion, edema and Rare palpitations.  Exertional dyspnea is because of deconditioning baseline dyspnea is stable.. negative for - chest pain, orthopnea, paroxysmal nocturnal dyspnea, rapid heart rate or Syncope or  near syncope, TIA/amaurosis fugax or claudication  The patient does not have symptoms concerning for COVID-19 infection (fever, chills, cough, or new shortness of breath).   REVIEWED OF SYSTEMS   Review of Systems  Constitutional: Positive for malaise/fatigue (Does not have a lot of energy, deconditioned.  But is trying to increase exercise.  Has been doing well and her fall.). Negative for weight loss.  HENT: Negative for congestion and nosebleeds.   Respiratory: Positive for cough (Occasional mild cough.) and shortness of breath (At baseline.).   Gastrointestinal: Negative for blood in stool and melena.  Genitourinary: Negative for hematuria.   Musculoskeletal: Positive for joint pain (Left leg; left shoulder and upper arm as well as her feet.).  Neurological: Positive for dizziness (Symptoms proceed for stands up, but otherwise stable) and weakness (Legs are weak, mostly left leg). Negative for seizures and loss of consciousness.  Psychiatric/Behavioral: Negative for depression and memory loss. The patient is not nervous/anxious and does not have insomnia.    I have reviewed and (if needed) personally updated the patient's problem list, medications, allergies, past medical and surgical history, social and family history.   PAST MEDICAL HISTORY   Past Medical History:  Diagnosis Date  . Abnormal SPEP 04/17/2014  . Acute left ankle pain 01/26/2017  . ANEMIA-UNSPECIFIED 09/18/2009  . Anxiety   . Arthritis   . Chronic diastolic heart failure, NYHA class 2 (HCC)    Normal LVEDP by May 2018  . COPD (chronic obstructive pulmonary disease) ([place])   . Depression   . DIABETES MELLITUS, TYPE II 08/21/2006  . Diabetic osteomyelitis ([place]) 05/29/2015  . Fatty liver   . Fracture of 5th metatarsal    non union  . GERD 44/96/7591   Had fundoplication  . GOUT 08/20/2010  . Hammer toe of right foot    2-5th toes  . Hx of umbilical hernia repair   . HYPERLIPIDEMIA 08/21/2006  . HYPERTENSION 08/21/2006  . Infection of wound due to methicillin resistant Staphylococcus aureus (MRSA)   . Internal hemorrhoids   . Kidney problem   . Multiple allergies 10/14/2016  . OBESITY 06/04/2009  . Onychomycosis 10/27/2015  . Osteomyelitis of left foot ([place]) 05/29/2015  . Pulmonary sarcoidosis ([place])    Followed locally by pulmonology, but also by Dr. Casper Harrison at [facility]  . Right knee pain 01/26/2017  . Vocal cord dysfunction   . Wears partial dentures     PAST SURGICAL HISTORY   Past Surgical History:  Procedure Laterality Date  . ABDOMINAL HYSTERECTOMY    . APPENDECTOMY    . BLADDER SUSPENSION  11/11/2011   Procedure: TRANSVAGINAL TAPE  (TVT) PROCEDURE;  Surgeon: Olga Millers, MD;  Location: [facility];  Service: Gynecology;  Laterality: N/A;  . CAROTIDS  02/18/11   CAROTID DUPLEX; VERTEBRALS ARE PATENT WITH ANTEGRADE FLOW. ICA/CCA RATIO 1.61 ON RIGHT AND 0.75 ON LEFT  . CHOLECYSTECTOMY  1984  . COLONOSCOPY  04/29/2010   [name]  . CYSTOSCOPY  11/11/2011   Procedure: CYSTOSCOPY;  Surgeon: Olga Millers, MD;  Location: [facility];  Service: Gynecology;  Laterality: N/A;  . EXTUBATION (ENDOTRACHEAL) IN OR N/A 09/23/2017   Procedure: EXTUBATION (ENDOTRACHEAL) IN OR;  Surgeon: Helayne Seminole, MD;  Location: [place];  Service: ENT;  Laterality: N/A;  . FIBEROPTIC LARYNGOSCOPY AND TRACHEOSCOPY N/A 09/23/2017   Procedure: FLEXIBLE FIBEROPTIC LARYNGOSCOPY;  Surgeon: Helayne Seminole, MD;  Location: [place];  Service: ENT;  Laterality: N/A;  . FRACTURE SURGERY  foot  . HALLUX FUSION Left 10/02/2018   Procedure: HALLUX ARTHRODESIS;  Surgeon: Edrick Kins, DPM;  Location: [facility];  Service: Podiatry;  Laterality: Left;  . HAMMER TOE SURGERY Left 10/02/2018   Procedure: HAMMER TOE CORRECTION 2ND 3RD 4RD FIFTH TOE;  Surgeon: Edrick Kins, DPM;  Location: [facility];  Service: Podiatry;  Laterality: Left;  . HAMMER TOE SURGERY Right 04/12/2019   Procedure: HAMMER TOE CORRECTION, 2ND, 3RD, 4TH AND 5TH TOES OF RIGHT FOOT;  Surgeon: Edrick Kins, DPM;  Location: [place];  Service: Podiatry;  Laterality: Right;  . HAMMER TOE SURGERY Left 10/25/2019   Procedure: HAMMER TOE REPAIR SECOND, THIRD, FOUTH;  Surgeon: Edrick Kins, DPM;  Location: [place] [facility];  Service: Podiatry;  Laterality: Left;  . HERNIA REPAIR    . I & D EXTREMITY Left 06/27/2015   Procedure: Partial Excision Left Calcaneus, Place Antibiotic Beads, and Wound VAC;  Surgeon: Newt Minion, MD;  Location: [place];  Service: Orthopedics;  Laterality: Left;  . JOINT REPLACEMENT    . KNEE ARTHROSCOPY     right  . LEFT AND RIGHT HEART CATHETERIZATION WITH CORONARY ANGIOGRAM N/A 04/23/2013    Procedure: LEFT AND RIGHT HEART CATHETERIZATION WITH CORONARY ANGIOGRAM;  Surgeon: Leonie Man, MD;  Location: [facility] CATH LAB;  Service: Cardiovascular;  Laterality: N/A;  . LEFT HEART CATH AND CORONARY ANGIOGRAPHY N/A 03/11/2017   Procedure: Left Heart Cath and Coronary Angiography;  Surgeon: Sherren Mocha, MD;  Location: [place] CV LAB; angiographically minimal CAD in the LAD otherwise normal.;  Normal LVEDP.  FALSE POSITIVE MYOVIEW  . LEFT HEART CATH AND CORONARY ANGIOGRAPHY  07/20/2010   LVEF 50-55% WITH VERY MILD GLOBAL HYPOKINESIA; ESSENTIALLY NORMAL CORONARY ARTERIES; NORMAL LV FUNCTION  . METATARSAL OSTEOTOMY WITH OPEN REDUCTION INTERNAL FIXATION (ORIF) METATARSAL WITH FUSION Left 04/09/2014   Procedure: LEFT FOOT FRACTURE OPEN TREATMENT METATARSAL INCLUDES INTERNAL FIXATION EACH;  Surgeon: Lorn Junes, MD;  Location: [place];  Service: Orthopedics;  Laterality: Left;  . NISSEN FUNDOPLICATION  4431  . NM MYOVIEW LTD --> FALSE POSITIVE  03/10/2017   : Moderate size "stress-induced "perfusion defect at the apex as well as "ill-defined stress-induced perfusion defect in the lateral wall.  EF 55%.  INTERMEDIATE risk. -->  FALSE POSITIVE  . ORIF DISTAL FEMUR FRACTURE  08/08/2020   [facility] [place]: ORIF of left extra-articular periprosthetic distal femur fracture with synthesis of the femur plate with cortical nonlocking screws.  (Thought to be pathologic fracture due to osteoporosis) -> after a fall  . ORIF FEMUR W/ PERI-IMPLANT  08/29/2020   [facility]) revision of left femur fracture ORIF plate fixation screws; culture positive for MSSA  . Right and left CARDIAC CATHETERIZATION  04/23/2013   Angiographic normal coronaries; LVEDP 20 mmHg, PCWP 12-14 mmHg, RAP 12 mmHg.; Fick CO/CI 4.9/2.2  . SHOULDER ARTHROSCOPY Left 03/14/2019   Procedure: LEFT SHOULDER ARTHROSCOPY, DEBRIDEMENT, AND DECOMPRESSION;  Surgeon: Newt Minion, MD;  Location: [place];   Service: Orthopedics;  Laterality: Left;  . TOTAL KNEE ARTHROPLASTY Right 06/29/2017   Procedure: RIGHT TOTAL KNEE ARTHROPLASTY;  Surgeon: Newt Minion, MD;  Location: [place];  Service: Orthopedics;  Laterality: Right;  . TOTAL KNEE ARTHROPLASTY Left 12/07/2017  . TOTAL KNEE ARTHROPLASTY Left 12/07/2017   Procedure: LEFT TOTAL KNEE ARTHROPLASTY;  Surgeon: Newt Minion, MD;  Location: [place];  Service: Orthopedics;  Laterality: Left;  . TRACHEOSTOMY TUBE PLACEMENT N/A 09/20/2017  Procedure: AWAKE INTUBATION WITH ANESTHESIA WITH VIDEO ASSISTANCE;  Surgeon: Helayne Seminole, MD;  Location: [place] [facility];  Service: ENT;  Laterality: N/A;  . TRANSTHORACIC ECHOCARDIOGRAM  08/2014   Normal LV size and function.  Mild LVH.  EF 55-60%.  Normal regional wall motion.  GR 1 DD.  Normal RV size and function .  [name] TRANSTHORACIC ECHOCARDIOGRAM  08/12/2020    [facility]): Normal LV size and function.  EF 55 to 60%.  No RWM A.  Normal filling pattern.  Normal RV.  No significant valvular disease-stenosis or regurgitation.  No vegetation.  . TUBAL LIGATION     with reversal in 1994  . VENTRAL HERNIA REPAIR      Immunization History  Administered Date(s) Administered  . H1N1 09/24/2008  . Hep A / Hep B 02/20/2018, 04/03/2018, 08/23/2018  . Influenza Inj Mdck Quad With Preservative 07/12/2019  . Influenza Split 07/07/2011, 07/18/2012  . Influenza Whole 08/15/2007, 06/26/2008, 07/23/2009, 07/16/2010  . Influenza, Seasonal, Injecte, Preservative Fre 10/26/2011, 07/18/2012  . Influenza,inj,Quad PF,6+ Mos 06/12/2013, 07/15/2014, 08/08/2015, 06/22/2016, 07/01/2017, 07/14/2018, 07/04/2019, 07/02/2020  . Influenza-Unspecified 09/24/2008, 07/26/2019  . PFIZER(Purple Top)SARS-COV-2 Vaccination 12/26/2019, 01/16/2020  . Pneumococcal Conjugate-13 05/06/2014  . Pneumococcal Polysaccharide-23 09/30/2008, 02/13/2013  . Td 05/21/2010  . Tdap 07/02/2020  . Zoster Recombinat (Shingrix) 03/02/2017, 05/02/2017     MEDICATIONS/ALLERGIES   Current Meds  Medication Sig  . albuterol (PROVENTIL HFA;VENTOLIN HFA) 108 (90 Base) MCG/ACT inhaler Inhale 1-2 puffs into the lungs every 6 (six) hours as needed for wheezing or shortness of breath.  . allopurinol (ZYLOPRIM) 100 MG tablet Take 100 mg by mouth every evening.   [name] atorvastatin (LIPITOR) 40 MG tablet TAKE 1 TABLET BY MOUTH EVERY DAY  . BREO ELLIPTA 200-25 MCG/INH AEPB TAKE 1 PUFF BY MOUTH EVERY DAY  . buPROPion (WELLBUTRIN XL) 300 MG 24 hr tablet TAKE 1 TABLET BY MOUTH EVERY DAY  . [EXPIRED] calcium carbonate (OS-CAL - DOSED IN MG OF ELEMENTAL CALCIUM) 1250 (500 Ca) MG tablet Take 1 tablet by mouth daily.  . carvedilol (COREG) 12.5 MG tablet Take 12.5 mg by mouth 2 (two) times daily with a meal.  . chlorpheniramine-HYDROcodone (TUSSIONEX PENNKINETIC ER) 10-8 MG/5ML SUER Take 5 mLs by mouth at bedtime as needed for cough.  . Cholecalciferol 25 MCG (1000 UT) capsule Take 1,000 Units by mouth in the morning and at bedtime.  [name] DEXILANT 60 MG capsule TAKE 1 CAPSULE BY MOUTH EVERY DAY  . diclofenac Sodium (VOLTAREN) 1 % GEL Apply 1 application topically as needed (pain.).   [name] diltiazem (CARDIZEM CD) 120 MG 24 hr capsule TAKE 1 CAPSULE BY MOUTH EVERY DAY  . Dulaglutide (TRULICITY) 3 HA/1.9FX SOPN Inject 0.5 mLs (3 mg total) into the skin once a week.  . famotidine (PEPCID) 20 MG tablet Take 1 tablet (20 mg total) by mouth at bedtime.  . fenofibrate 160 MG tablet TAKE 1 TABLET BY MOUTH EVERY DAY  . fluticasone (FLONASE) 50 MCG/ACT nasal spray Place 2 sprays into both nostrils daily as needed for allergies.  . furosemide (LASIX) 40 MG tablet TAKE 1 TABLET BY MOUTH TWICE A DAY  . gabapentin (NEURONTIN) 100 MG capsule TAKE 1 CAPSULE BY MOUTH THREE TIMES A DAY  . glucose blood (CONTOUR NEXT TEST) test strip 1 each by Other route 4 (four) times daily. And lancets 4/day  . HYDROcodone-acetaminophen (NORCO/VICODIN) 5-325 MG tablet Take 1 tablet by mouth every 6  (six) hours as needed.  . insulin glargine (  LANTUS SOLOSTAR) 100 UNIT/ML Solostar Pen Inject 25 Units into the skin every morning.  . insulin lispro (HUMALOG KWIKPEN) 100 UNIT/ML KwikPen Inject 0.15 mLs (15 Units total) into the skin daily with supper.  . Insulin Pen Needle (PEN NEEDLES) 32G X 4 MM MISC 1 each by Does not apply route 4 (four) times daily. E11.9  . KLOR-CON M20 20 MEQ tablet TAKE 1 & 1/2 TABLETS BY MOUTH TWICE A DAY  . lactulose (CHRONULAC) 10 GM/15ML solution TAKE 15 MLS BY MOUTH DAILY AS NEEDED FOR CONSTIPATION.  [name] linaclotide (LINZESS) 290 MCG CAPS capsule Take by mouth.  [name] LORazepam (ATIVAN) 0.5 MG tablet Take 0.5 mg by mouth every 12 (twelve) hours as needed for anxiety.  . metFORMIN (GLUCOPHAGE) 1000 MG tablet Take 1 tablet (1,000 mg total) by mouth 2 (two) times daily with a meal.  . Microlet Lancets MISC 1 each by Does not apply route 2 (two) times daily. E11.9  . nitroGLYCERIN (NITROSTAT) 0.4 MG SL tablet Place 1 tablet (0.4 mg total) under the tongue every 5 (five) minutes as needed for chest pain. Reported on 01/05/2016  . ondansetron (ZOFRAN-ODT) 4 MG disintegrating tablet Take 1 tablet (4 mg total) by mouth every 8 (eight) hours as needed for nausea or vomiting.  [name] telmisartan (MICARDIS) 20 MG tablet TAKE 1 TABLET BY MOUTH EVERY DAY  . tiZANidine (ZANAFLEX) 4 MG tablet Take 0.5-1 tablets (2-4 mg total) by mouth every 8 (eight) hours as needed for muscle spasms.  [name] venlafaxine XR (EFFEXOR-XR) 75 MG 24 hr capsule TAKE 1 CAPSULE BY MOUTH TWICE A DAY  . [DISCONTINUED] aspirin EC 81 MG tablet Take 162 mg by mouth daily.   . [DISCONTINUED] isosorbide mononitrate (IMDUR) 30 MG 24 hr tablet TAKE 1 TABLET BY MOUTH EVERYDAY AT BEDTIME  . [DISCONTINUED] rifampin (RIFADIN) 300 MG capsule Take 300 mg by mouth 2 (two) times daily.  . [DISCONTINUED] Sodium Sulfate-Mag Sulfate-KCl (SUTAB) [phone] MG TABS Take 1 kit by mouth as directed. MANUFACTURER CODES!! BIN: [id number] PCN: [id number]  GROUP: [id number] MEMBER ID: [id number];NHA AS CASH;NO PRIOR AUTHORIZATION   Allergies  Allergen Reactions  . Methotrexate Other (See Comments)    Peri-oral and buccal lesions  . Vancomycin Other (See Comments)    DOSE RELATED NEPHROTOXICITY  . Lisinopril Cough  . Chlorhexidine Itching  . Clindamycin/Lincomycin Nausea And Vomiting and Rash  . Doxycycline Rash  . Teflaro [Ceftaroline] Rash and Other (See Comments)    Tolerates ceftriaxone     SOCIAL HISTORY/FAMILY HISTORY   Reviewed in Epic:  Pertinent findings:  Social History   Tobacco Use  . Smoking status: Never Smoker  . Smokeless tobacco: Never Used  Vaping Use  . Vaping Use: Never used  Substance Use Topics  . Alcohol use: No    Alcohol/week: 0.0 standard drinks  . Drug use: No   Social History   Social History Narrative   Married 1994. 2 sons who both live close and 1 grandson.       Disability due to sarcoidosis. 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  Now appetite is down.  Physical Exam: BP 123/78   Pulse 74   Ht 5' 6"  (1.676 m)   Wt 221 lb 3.2 oz (100.3 kg)   SpO2 96%   BMI 35.70 kg/m  Physical Exam Vitals reviewed.  Constitutional:      General: She is not in acute distress.    Appearance: Normal appearance. She is obese. She is not ill-appearing or toxic-appearing.  HENT:     Head: Normocephalic and atraumatic.  Neck:     Vascular: No carotid bruit or JVD (Unable to assess).  Cardiovascular:     Rate and Rhythm: Normal rate and regular rhythm. Occasional extrasystoles are present.    Chest Wall: PMI is not displaced  (Unable to assess).     Pulses: Decreased pulses (Mildly diminished pedal pulses, difficult to palpate due to body habitus.).     Heart sounds: S1 normal and S2 normal. Heart sounds are distant. No murmur heard. No friction rub. No gallop. No S4 sounds.   Pulmonary:     Effort: Pulmonary effort is normal.     Comments: Diffusely decreased breath sounds due to body habitus.  Mild interstitial sounds but no crackles or rales.  No rhonchi.  No wheezing. Chest:     Chest wall: No tenderness.  Musculoskeletal:        General: Swelling (Trivial bilateral edema) present. Normal range of motion.     Cervical back: Normal range of motion and neck supple.  Neurological:     General: No focal deficit present.     Mental Status: She is alert and oriented to person, place, and time.  Psychiatric:        Mood and Affect: Mood normal.        Behavior: Behavior normal.        Thought Content: Thought content normal.        Judgment: Judgment normal.     Adult ECG Report  Rate: 74;  Rhythm: normal sinus rhythm and Cannot exclude septal MI, age undetermined.  Otherwise normal axis and intervals, or durations.;   Narrative Interpretation: Stable EKG.  Recent Labs: Reviewed. Lab Results  Component Value Date   CHOL 162 03/31/2020   HDL 40 03/31/2020   LDLCALC 84 03/31/2020   LDLDIRECT 68.0 01/22/2020   TRIG 227 (H) 03/31/2020   CHOLHDL 5 01/22/2020   Lab Results  Component Value Date   CREATININE 0.8 10/20/2020   BUN 10 10/20/2020   NA 139 10/20/2020   K 4.5 10/20/2020   CL 104 10/20/2020   CO2 21 10/20/2020   CBC Latest Ref Rng & Units 10/20/2020 10/15/2020 10/13/2020  WBC - 5.8 11.3(H) 7.4  Hemoglobin 12.0 - 16.0 7.9(A) 8.9(L) 8.0(A)  Hematocrit 36 - 46 26(A) 28.9(L) 26(A)  Platelets 150 - 399 257 321 259  - getting IV IRON.   Lab Results  Component Value Date   TSH 1.530 03/31/2020    ==================================================  COVID-19 Education: The signs and symptoms  of COVID-19 were discussed with the patient and how to seek care for testing (follow up with PCP or arrange E-visit).   The importance of social distancing and COVID-19 vaccination was discussed today. The patient is practicing social distancing & Masking.   I spent a total of 58mnutes with the patient spent in direct patient consultation.  Additional time spent with chart review  / charting (studies, outside notes, etc): 23 min -> back to back hospital stay, echocardiogram reviewed, PMH updated. Total  Time: 46 min   Current medicines are reviewed at length with the patient today.  (+/- concerns) N/A  This visit occurred during the SARS-CoV-2 public health emergency.  Safety protocols were in place, including screening questions prior to the visit, additional usage of staff PPE, and extensive cleaning of exam room while observing appropriate contact time as indicated for disinfecting solutions.  Notice: This dictation was prepared with Dragon dictation along with smaller phrase technology. Any transcriptional errors that result from this process are unintentional and may not be corrected upon review.  Patient Instructions / Medication Changes & Studies & Tests Ordered   Patient Instructions  Medication Instructions:   Stop taking Imdur   Decrease Aspirin  To 81 mg one tablet daily   *If you need a refill on your cardiac medications before your next appointment, please call your pharmacy*   Lab Work:  Not needed   Testing/Procedures:  Not needed  Follow-Up: At [facility], you and your health needs are our priority.  As part of our continuing mission to provide you with exceptional heart care, we have created designated Provider Care Teams.  These Care Teams include your primary Cardiologist (physician) and Advanced Practice Providers (APPs -  Physician Assistants and Nurse Practitioners) who all work together to provide you with the care you need, when you need it.      Your next appointment:   12 month(s)  The format for your next appointment:   In Person  Provider:   [name], MD      Studies Ordered:   Orders Placed This Encounter  Procedures  . EKG 12-Lead     [name], M.D., M.S. Interventional Cardiologist   Pager # [phone] Phone # [phone] [address] [place], [place] [address]   Thank you for choosing Heartcare at [facility]!!

## 2020-11-03 NOTE — Patient Instructions (Signed)
Medication Instructions:   Stop taking Imdur   Decrease Aspirin  To 81 mg one tablet daily   *If you need a refill on your cardiac medications before your next appointment, please call your pharmacy*   Lab Work:  Not needed   Testing/Procedures:  Not needed  Follow-Up: At [facility], you and your health needs are our priority.  As part of our continuing mission to provide you with exceptional heart care, we have created designated Provider Care Teams.  These Care Teams include your primary Cardiologist (physician) and Advanced Practice Providers (APPs -  Physician Assistants and Nurse Practitioners) who all work together to provide you with the care you need, when you need it.     Your next appointment:   12 month(s)  The format for your next appointment:   In Person  Provider:   [name], MD

## 2020-11-04 ENCOUNTER — Other Ambulatory Visit: Payer: Self-pay | Admitting: Endocrinology

## 2020-11-04 DIAGNOSIS — Z794 Long term (current) use of insulin: Secondary | ICD-10-CM

## 2020-11-04 DIAGNOSIS — E1129 Type 2 diabetes mellitus with other diabetic kidney complication: Secondary | ICD-10-CM

## 2020-11-05 ENCOUNTER — Encounter: Payer: 59 | Admitting: Physical Therapy

## 2020-11-10 ENCOUNTER — Other Ambulatory Visit: Payer: Self-pay | Admitting: Family Medicine

## 2020-11-10 ENCOUNTER — Encounter: Payer: 59 | Admitting: Physical Therapy

## 2020-11-12 ENCOUNTER — Encounter: Payer: Self-pay | Admitting: Physical Therapy

## 2020-11-12 ENCOUNTER — Ambulatory Visit (INDEPENDENT_AMBULATORY_CARE_PROVIDER_SITE_OTHER): Payer: 59 | Admitting: Physical Therapy

## 2020-11-12 DIAGNOSIS — M25662 Stiffness of left knee, not elsewhere classified: Secondary | ICD-10-CM | POA: Diagnosis not present

## 2020-11-12 DIAGNOSIS — M6281 Muscle weakness (generalized): Secondary | ICD-10-CM | POA: Diagnosis not present

## 2020-11-12 DIAGNOSIS — M79605 Pain in left leg: Secondary | ICD-10-CM | POA: Diagnosis not present

## 2020-11-12 NOTE — Therapy (Signed)
[facility] [address] [address] [place], [place], [address] Phone: [phone]   Fax:  [phone]  Physical Therapy Evaluation  Patient Details  Name: [name] MRN: [id number] Date of Birth: Feb 04, 1961 Referring Provider ([name]): Tana ConchStephen Hunter   Encounter Date: 11/03/2020   [name] End of Session - 11/12/20 2104    Visit Number 1    Number of Visits 12    Date for [name] Re-Evaluation 12/24/20    Authorization Type U[employer]    [name] Start Time 1350    [name] Stop Time 1430    [name] Time Calculation (min) 40 min    Activity Tolerance Patient tolerated treatment well    Behavior During [facility] for tasks assessed/performed           Past Medical History:  Diagnosis Date  . Abnormal SPEP 04/17/2014  . Acute left ankle pain 01/26/2017  . ANEMIA-UNSPECIFIED 09/18/2009  . Anxiety   . Arthritis   . CHF (congestive heart failure) (HCC)   . Chronic diastolic heart failure, NYHA class 2 (HCC)    Normal LVEDP by May 2018  . COPD (chronic obstructive pulmonary disease) (HCC)   . Depression   . DIABETES MELLITUS, TYPE II 08/21/2006  . Diabetic osteomyelitis (HCC) 05/29/2015  . Fatty liver   . Fracture of 5th metatarsal    non union  . GERD 08/21/2006   Had fundoplication  . GOUT 08/20/2010  . Hammer toe of right foot    2-5th toes  . Hx of umbilical hernia repair   . HYPERLIPIDEMIA 08/21/2006  . HYPERTENSION 08/21/2006  . Infection of wound due to methicillin resistant Staphylococcus aureus (MRSA)   . Internal hemorrhoids   . Kidney problem   . Multiple allergies 10/14/2016  . OBESITY 06/04/2009  . Onychomycosis 10/27/2015  . Osteomyelitis of left foot (HCC) 05/29/2015  . Pulmonary sarcoidosis (HCC)    Followed locally by pulmonology, but also [facility]  . Right knee pain 01/26/2017  . SOB (shortness of breath)   . Vocal cord dysfunction   . Wears partial dentures     Past Surgical History:  Procedure Laterality  Date  . ABDOMINAL HYSTERECTOMY    . APPENDECTOMY    . BLADDER SUSPENSION  11/11/2011   Procedure: TRANSVAGINAL TAPE (TVT) PROCEDURE;  Surgeon: Levi AlandMark E Anderson, MD;  Location: [facility];  Service: Gynecology;  Laterality: N/A;  . CAROTIDS  02/18/11   CAROTID DUPLEX; VERTEBRALS ARE PATENT WITH ANTEGRADE FLOW. ICA/CCA RATIO 1.61 ON RIGHT AND 0.75 ON LEFT  . CHOLECYSTECTOMY  1984  . COLONOSCOPY  04/29/2010   Marina GoodellPerry  . CYSTOSCOPY  11/11/2011   Procedure: CYSTOSCOPY;  Surgeon: Levi AlandMark E Anderson, MD;  Location: [facility];  Service: Gynecology;  Laterality: N/A;  . EXTUBATION (ENDOTRACHEAL) IN OR N/A 09/23/2017   Procedure: EXTUBATION (ENDOTRACHEAL) IN OR;  Surgeon: Graylin ShiverMarcellino, Amanda J, MD;  Location: [facility];  Service: ENT;  Laterality: N/A;  . FIBEROPTIC LARYNGOSCOPY AND TRACHEOSCOPY N/A 09/23/2017   Procedure: FLEXIBLE FIBEROPTIC LARYNGOSCOPY;  Surgeon: Graylin ShiverMarcellino, Amanda J, MD;  Location: [facility];  Service: ENT;  Laterality: N/A;  . FRACTURE SURGERY     foot  . HALLUX FUSION Left 10/02/2018   Procedure: HALLUX ARTHRODESIS;  Surgeon: Felecia ShellingEvans, Brent M, DPM;  Location: [facility];  Service: Podiatry;  Laterality: Left;  . HAMMER TOE SURGERY Left 10/02/2018   Procedure: HAMMER TOE CORRECTION 2ND 3RD 4RD FIFTH TOE;  Surgeon: Felecia ShellingEvans, Brent M, DPM;  Location:  [facility];  Service: Podiatry;  Laterality: Left;  . HAMMER TOE SURGERY Right 04/12/2019   Procedure: HAMMER TOE CORRECTION, 2ND, 3RD, 4TH AND 5TH TOES OF RIGHT FOOT;  Surgeon: Edrick Kins, DPM;  Location: [facility];  Service: Podiatry;  Laterality: Right;  . HAMMER TOE SURGERY Left 10/25/2019   Procedure: HAMMER TOE REPAIR SECOND, THIRD, FOUTH;  Surgeon: Edrick Kins, DPM;  Location: [facility];  Service: Podiatry;  Laterality: Left;  . HERNIA REPAIR    . I & D EXTREMITY Left 06/27/2015   Procedure: Partial Excision Left Calcaneus, Place Antibiotic Beads, and Wound VAC;  Surgeon: Newt Minion, MD;  Location: [facility];  Service: Orthopedics;  Laterality: Left;  . JOINT  REPLACEMENT    . KNEE ARTHROSCOPY     right  . LEFT AND RIGHT HEART CATHETERIZATION WITH CORONARY ANGIOGRAM N/A 04/23/2013   Procedure: LEFT AND RIGHT HEART CATHETERIZATION WITH CORONARY ANGIOGRAM;  Surgeon: Leonie Man, MD;  Location: [facility] CATH LAB;  Service: Cardiovascular;  Laterality: N/A;  . LEFT HEART CATH AND CORONARY ANGIOGRAPHY N/A 03/11/2017   Procedure: Left Heart Cath and Coronary Angiography;  Surgeon: Sherren Mocha, MD;  Location: [facility] CV LAB; angiographically minimal CAD in the LAD otherwise normal.;  Normal LVEDP.  FALSE POSITIVE MYOVIEW  . LEFT HEART CATH AND CORONARY ANGIOGRAPHY  07/20/2010   LVEF 50-55% WITH VERY MILD GLOBAL HYPOKINESIA; ESSENTIALLY NORMAL CORONARY ARTERIES; NORMAL LV FUNCTION  . METATARSAL OSTEOTOMY WITH OPEN REDUCTION INTERNAL FIXATION (ORIF) METATARSAL WITH FUSION Left 04/09/2014   Procedure: LEFT FOOT FRACTURE OPEN TREATMENT METATARSAL INCLUDES INTERNAL FIXATION EACH;  Surgeon: Lorn Junes, MD;  Location: [facility];  Service: Orthopedics;  Laterality: Left;  . NISSEN FUNDOPLICATION  5697  . NM MYOVIEW LTD  03/09/2013   Lexiscan --> EF 50%; NORMAL MYOCARDIAL PERFUSION STUDY - breast attenuation  . NM MYOVIEW LTD  03/10/2017   : Moderate size "stress-induced "perfusion defect at the apex as well as "ill-defined stress-induced perfusion defect in the lateral wall.  EF 55%.  INTERMEDIATE risk. -->  FALSE POSITIVE  . Right and left CARDIAC CATHETERIZATION  04/23/2013   Angiographic normal coronaries; LVEDP 20 mmHg, PCWP 12-14 mmHg, RAP 12 mmHg.; Fick CO/CI 4.9/2.2  . SHOULDER ARTHROSCOPY Left 03/14/2019   Procedure: LEFT SHOULDER ARTHROSCOPY, DEBRIDEMENT, AND DECOMPRESSION;  Surgeon: Newt Minion, MD;  Location: [facility];  Service: Orthopedics;  Laterality: Left;  . TOTAL KNEE ARTHROPLASTY Right 06/29/2017   Procedure: RIGHT TOTAL KNEE ARTHROPLASTY;  Surgeon: Newt Minion, MD;  Location: [facility];  Service: Orthopedics;  Laterality: Right;   . TOTAL KNEE ARTHROPLASTY Left 12/07/2017  . TOTAL KNEE ARTHROPLASTY Left 12/07/2017   Procedure: LEFT TOTAL KNEE ARTHROPLASTY;  Surgeon: Newt Minion, MD;  Location: [facility];  Service: Orthopedics;  Laterality: Left;  . TRACHEOSTOMY TUBE PLACEMENT N/A 09/20/2017   Procedure: AWAKE INTUBATION WITH ANESTHESIA WITH VIDEO ASSISTANCE;  Surgeon: Helayne Seminole, MD;  Location: [facility];  Service: ENT;  Laterality: N/A;  . TRANSTHORACIC ECHOCARDIOGRAM  08/2014   Normal LV size and function.  Mild LVH.  EF 55-60%.  Normal regional wall motion.  GR 1 DD.  Normal RV size and function .  [name] TUBAL LIGATION     with reversal in 1994  . VENTRAL HERNIA REPAIR      There were no vitals filed for this visit.    Subjective Assessment - 11/12/20 2101    Subjective Had fall Oct 28th was going  down stairs at sons apartment, and fell. Suffered distal femur fracture. Had ORIF of distal femur. Also had complication with infection of hardware, and had to have 2nd surgery. Now WBAT, using RW, done with home health. Lives with husband    Pertinent History Bil TKA    Limitations Walking;Standing;House hold activities    Patient Stated Goals improved strength, use, gait    Currently in Pain? Yes    Pain Score 3     Pain Location Leg    Pain Orientation Left    Pain Descriptors / Indicators Aching    Pain Type Acute pain    Pain Onset More than a month ago    Pain Frequency Intermittent              [facility] [name] Assessment - 11/12/20 0001      Assessment   Medical Diagnosis L ORIF for distal femur fx/ Weakness    Referring Provider ([name]) Garret Reddish    Onset Date/Surgical Date 08/08/21    Prior Therapy no      Precautions   Precaution Comments no open chain knee extension per last MD note      Restrictions   Weight Bearing Restrictions No      Balance Screen   Has the patient fallen in the past 6 months Yes    How many times? 1    Has the patient had a decrease in activity level because of a  fear of falling?  Yes    Is the patient reluctant to leave their home because of a fear of falling?  No      Prior Function   Level of Independence Independent      Cognition   Overall Cognitive Status Within Functional Limits for tasks assessed      AROM   Overall AROM Comments L hip: WFL    Left Knee Extension --   wnl   Left Knee Flexion 105      Strength   Overall Strength Comments L Knee: 4/5,  L hip: 4-/5      Palpation   Palpation comment enlarged L knee joint, well healed incisions med and lateral knee, Mild crepitus of L knee with movement.      Ambulation/Gait   Gait Comments slow speed with RW                      Objective measurements completed on examination: See above findings.       [facility] Adult [name] Treatment/Exercise - 11/12/20 2055      Knee/Hip Exercises: Seated   Long Arc Quad Both;10 reps      Knee/Hip Exercises: Supine   Quad Sets 15 reps    Heel Slides 15 reps;Left    Straight Leg Raises 10 reps;Both      Manual Therapy   Passive ROM for L knee flexion                  [name] Education - 11/12/20 2104    Education Details [name] POC, Exam findins, HEP    Person(s) Educated Patient    Methods Explanation;Demonstration;Tactile cues;Verbal cues;Handout    Comprehension Verbalized understanding;Returned demonstration;Verbal cues required;Tactile cues required;Need further instruction            [name] Short Term Goals - 11/12/20 2107      [name] SHORT TERM GOAL #1   Title [name] to be independent with initial HEP    Time 2  Period Weeks    Status New    Target Date 11/17/20      [name] SHORT TERM GOAL #2   Title [name] to demo improved ROM of L knee by at 10 degrees    Time 4    Period Weeks    Status New    Target Date 11/17/20             [name] Long Term Goals - 11/12/20 2108      [name] LONG TERM GOAL #1   Title [name] to be independent with HEP    Time 6    Period Weeks    Status New    Target Date 12/15/20      [name] LONG TERM  GOAL #2   Title [name] to demo L knee ROM to be at least 115 deg    Time 6    Period Weeks    Status New    Target Date 12/15/20      [name] LONG TERM GOAL #3   Title [name] to demo L quad at hip strength to be at least 4+/5 to improve stability and gait    Time 6    Period Weeks    Status New    Target Date 12/15/20      [name] LONG TERM GOAL #4   Title [name] to demo stability and [place] of L LE to be WNL with gait, dynamic balance and single leg stance    Time 6    Period Weeks    Status New    Target Date 12/15/20                  Plan - 11/12/20 2123    Clinical Impression Statement [name] presents with primary deficits in L leg, following ORIF for distal femur fracture on 08/08/21. [name] with stiffness in L knee joint as well as mild swelling in same area. [name] with weakness in L hip and knee, and poor stability in weight bearing. [name] with decreased gait mechanics and endurance due to pain and deficits. [name] to benefit from skilled [name] to improve deficits and pain.    Personal Factors and Comorbidities Fitness;Comorbidity 1    Comorbidities surgery with complications, L TKA already present    Examination-Activity Limitations Stand;Stairs;Squat;Locomotion Level    Examination-Participation Restrictions Cleaning;Community Activity;Shop    Stability/Clinical Decision Making Evolving/Moderate complexity    Clinical Decision Making Moderate    Rehab Potential Good    [name] Frequency 2x / week    [name] Duration 6 weeks    [name] Treatment/Interventions ADLs/Self Care Home Management;Cryotherapy;Electrical Stimulation;DME Instruction;Ultrasound;Moist Heat;Iontophoresis 4mg /ml Dexamethasone;Gait training;Stair training;Functional mobility training;Therapeutic activities;Therapeutic exercise;Balance training;Patient/family education;Neuromuscular re-education;Manual techniques;Passive range of motion;Dry needling;Taping;Vasopneumatic Device;Spinal Manipulations;Joint Manipulations    Consulted and Agree with Plan of Care  Patient           Patient will benefit from skilled therapeutic intervention in order to improve the following deficits and impairments:  Abnormal gait,Decreased range of motion,Difficulty walking,Increased muscle spasms,Decreased activity tolerance,Pain,Decreased balance,Impaired flexibility,Hypomobility,Decreased mobility,Decreased strength,Increased edema  Visit Diagnosis: Muscle weakness (generalized)  Pain in left leg  Stiffness of knee joint, left     Problem List Patient Active Problem List   Diagnosis Date Noted  . Iron deficiency anemia 10/01/2020  . Postural dizziness with presyncope 09/30/2020  . Hypertriglyceridemia 04/02/2020  . Aortic atherosclerosis ([place]) 02/14/2020  . Nontraumatic complete tear of left rotator cuff   . Diabetic neuropathy ([place]) 02/20/2019  . Severe persistent asthma 01/04/2019  .  Subcortical microvascular ischemic occlusive disease 07/12/2018  . Rapid palpitations 07/12/2018  . Impingement of left ankle joint 06/26/2018  . Constipation 04/21/2018  . Total knee replacement status, left 12/07/2017  . Dysphagia 09/20/2017  . Epiglottitis   . Plantar fasciitis, right 07/13/2017  . S/P total knee arthroplasty, right 06/29/2017  . Osteoarthrosis, localized, primary, knee, right   . Sprain of calcaneofibular ligament of right ankle 06/06/2017  . Osteoarthritis of right knee 01/26/2017  . Onychomycosis 10/27/2015  . CKD (chronic kidney disease) stage 3, GFR 30-59 ml/min (HCC) 04/27/2015  . MRSA (methicillin resistant staph aureus) culture positive 03/27/2015  . History of osteomyelitis 03/27/2015  . Fatty liver 09/30/2014  . Hot flashes 07/15/2014  . Abnormal SPEP 04/17/2014  . Fracture of left leg 04/17/2014  . Cushingoid side effect of steroids ([place]) 04/17/2014  . Internal hemorrhoids   . Pulmonary sarcoidosis ([place])   . Major depression in full remission ([place])   . Preoperative clearance 03/25/2014  . Obstructive chronic bronchitis  without exacerbation ([place]) 09/18/2013  . Chest pain 04/11/2013  . Hypertensive heart disease with chronic diastolic congestive heart failure ([place])   . Solitary pulmonary nodule, on CT 02/2013 - stable over 2 years in 2015 02/20/2013  . Gout 08/20/2010  . Deficiency anemia 09/18/2009  . Morbid obesity ([place]) 06/04/2009  . Sleep apnea 04/21/2009  . Sarcoidosis of lung ([place]) 04/10/2007  . Hyperlipidemia associated with type 2 diabetes mellitus ([place]) 08/21/2006  . Hypertension associated with diabetes ([place]) 08/21/2006  . GERD 08/21/2006  . Type II diabetes mellitus with renal manifestations ([place]) 08/21/2006   [name], [name], [name] 9:32 PM  11/12/20   [facility] [place], [name], [address] Phone: [phone]   Fax:  [phone]  Name: [name] MRN: [id number] Date of Birth: 12-04-1960

## 2020-11-12 NOTE — Therapy (Signed)
[facility] [address] [place], [name], [address] Phone: [phone]   Fax:  [phone]  Physical Therapy Treatment  Patient Details  Name: [name] MRN: [id number] Date of Birth: September 20, 1961 Referring Provider (PT): Garret Reddish   Encounter Date: 11/12/2020   PT End of Session - 11/12/20 2135    Visit Number 2    Number of Visits 12    Date for PT Re-Evaluation 12/15/20    Authorization Type U[employer]    PT Start Time 1430    PT Stop Time 1514    PT Time Calculation (min) 44 min    Activity Tolerance Patient tolerated treatment well    Behavior During Therapy [facility] for tasks assessed/performed           Past Medical History:  Diagnosis Date  . Abnormal SPEP 04/17/2014  . Acute left ankle pain 01/26/2017  . ANEMIA-UNSPECIFIED 09/18/2009  . Anxiety   . Arthritis   . CHF (congestive heart failure) ([place])   . Chronic diastolic heart failure, NYHA class 2 (HCC)    Normal LVEDP by May 2018  . COPD (chronic obstructive pulmonary disease) ([place])   . Depression   . DIABETES MELLITUS, TYPE II 08/21/2006  . Diabetic osteomyelitis ([place]) 05/29/2015  . Fatty liver   . Fracture of 5th metatarsal    non union  . GERD 32/67/1245   Had fundoplication  . GOUT 08/20/2010  . Hammer toe of right foot    2-5th toes  . Hx of umbilical hernia repair   . HYPERLIPIDEMIA 08/21/2006  . HYPERTENSION 08/21/2006  . Infection of wound due to methicillin resistant Staphylococcus aureus (MRSA)   . Internal hemorrhoids   . Kidney problem   . Multiple allergies 10/14/2016  . OBESITY 06/04/2009  . Onychomycosis 10/27/2015  . Osteomyelitis of left foot ([place]) 05/29/2015  . Pulmonary sarcoidosis ([place])    Followed locally by pulmonology, but also by Dr. Casper Harrison at  [facility]  . Right knee pain 01/26/2017  . SOB (shortness of breath)   . Vocal cord dysfunction   . Wears partial dentures     Past Surgical History:  Procedure Laterality Date   . ABDOMINAL HYSTERECTOMY    . APPENDECTOMY    . BLADDER SUSPENSION  11/11/2011   Procedure: TRANSVAGINAL TAPE (TVT) PROCEDURE;  Surgeon: Olga Millers, MD;  Location: [facility];  Service: Gynecology;  Laterality: N/A;  . CAROTIDS  02/18/11   CAROTID DUPLEX; VERTEBRALS ARE PATENT WITH ANTEGRADE FLOW. ICA/CCA RATIO 1.61 ON RIGHT AND 0.75 ON LEFT  . CHOLECYSTECTOMY  1984  . COLONOSCOPY  04/29/2010   [name]  . CYSTOSCOPY  11/11/2011   Procedure: CYSTOSCOPY;  Surgeon: Olga Millers, MD;  Location: [facility];  Service: Gynecology;  Laterality: N/A;  . EXTUBATION (ENDOTRACHEAL) IN OR N/A 09/23/2017   Procedure: EXTUBATION (ENDOTRACHEAL) IN OR;  Surgeon: Helayne Seminole, MD;  Location: [facility];  Service: ENT;  Laterality: N/A;  . FIBEROPTIC LARYNGOSCOPY AND TRACHEOSCOPY N/A 09/23/2017   Procedure: FLEXIBLE FIBEROPTIC LARYNGOSCOPY;  Surgeon: Helayne Seminole, MD;  Location: [facility];  Service: ENT;  Laterality: N/A;  . FRACTURE SURGERY     foot  . HALLUX FUSION Left 10/02/2018   Procedure: HALLUX ARTHRODESIS;  Surgeon: Edrick Kins, DPM;  Location: [facility];  Service: Podiatry;  Laterality: Left;  . HAMMER TOE SURGERY Left 10/02/2018   Procedure: HAMMER TOE CORRECTION 2ND 3RD 4RD FIFTH TOE;  Surgeon: Edrick Kins, DPM;  Location:  [facility];  Service: Podiatry;  Laterality: Left;  . HAMMER TOE SURGERY Right 04/12/2019   Procedure: HAMMER TOE CORRECTION, 2ND, 3RD, 4TH AND 5TH TOES OF RIGHT FOOT;  Surgeon: Edrick Kins, DPM;  Location: [facility];  Service: Podiatry;  Laterality: Right;  . HAMMER TOE SURGERY Left 10/25/2019   Procedure: HAMMER TOE REPAIR SECOND, THIRD, FOUTH;  Surgeon: Edrick Kins, DPM;  Location: [facility];  Service: Podiatry;  Laterality: Left;  . HERNIA REPAIR    . I & D EXTREMITY Left 06/27/2015   Procedure: Partial Excision Left Calcaneus, Place Antibiotic Beads, and Wound VAC;  Surgeon: Newt Minion, MD;  Location: [place];  Service: Orthopedics;  Laterality: Left;  . JOINT REPLACEMENT     . KNEE ARTHROSCOPY     right  . LEFT AND RIGHT HEART CATHETERIZATION WITH CORONARY ANGIOGRAM N/A 04/23/2013   Procedure: LEFT AND RIGHT HEART CATHETERIZATION WITH CORONARY ANGIOGRAM;  Surgeon: Leonie Man, MD;  Location: [facility] C[facility];  Service: Cardiovascular;  Laterality: N/A;  . LEFT HEART CATH AND CORONARY ANGIOGRAPHY N/A 03/11/2017   Procedure: Left Heart Cath and Coronary Angiography;  Surgeon: Sherren Mocha, MD;  Location: [facility]; angiographically minimal CAD in the LAD otherwise normal.;  Normal LVEDP.  FALSE POSITIVE MYOVIEW  . LEFT HEART CATH AND CORONARY ANGIOGRAPHY  07/20/2010   LVEF 50-55% WITH VERY MILD GLOBAL HYPOKINESIA; ESSENTIALLY NORMAL CORONARY ARTERIES; NORMAL LV FUNCTION  . METATARSAL OSTEOTOMY WITH OPEN REDUCTION INTERNAL FIXATION (ORIF) METATARSAL WITH FUSION Left 04/09/2014   Procedure: LEFT FOOT FRACTURE OPEN TREATMENT METATARSAL INCLUDES INTERNAL FIXATION EACH;  Surgeon: Lorn Junes, MD;  Location: [facility];  Service: Orthopedics;  Laterality: Left;  . NISSEN FUNDOPLICATION  [id number]  . NM MYOVIEW LTD  03/09/2013   Lexiscan --> EF 50%; NORMAL MYOCARDIAL PERFUSION STUDY - breast attenuation  . NM MYOVIEW LTD  03/10/2017   : Moderate size "stress-induced "perfusion defect at the apex as well as "ill-defined stress-induced perfusion defect in the lateral wall.  EF 55%.  INTERMEDIATE risk. -->  FALSE POSITIVE  . Right and left CARDIAC CATHETERIZATION  04/23/2013   Angiographic normal coronaries; LVEDP 20 mmHg, PCWP 12-14 mmHg, RAP 12 mmHg.; Fick CO/CI 4.9/2.2  . SHOULDER ARTHROSCOPY Left 03/14/2019   Procedure: LEFT SHOULDER ARTHROSCOPY, DEBRIDEMENT, AND DECOMPRESSION;  Surgeon: Newt Minion, MD;  Location: [facility];  Service: Orthopedics;  Laterality: Left;  . TOTAL KNEE ARTHROPLASTY Right 06/29/2017   Procedure: RIGHT TOTAL KNEE ARTHROPLASTY;  Surgeon: Newt Minion, MD;  Location: [facility];  Service: Orthopedics;  Laterality: Right;  . TOTAL  KNEE ARTHROPLASTY Left 12/07/2017  . TOTAL KNEE ARTHROPLASTY Left 12/07/2017   Procedure: LEFT TOTAL KNEE ARTHROPLASTY;  Surgeon: Newt Minion, MD;  Location: [facility];  Service: Orthopedics;  Laterality: Left;  . TRACHEOSTOMY TUBE PLACEMENT N/A 09/20/2017   Procedure: AWAKE INTUBATION WITH ANESTHESIA WITH VIDEO ASSISTANCE;  Surgeon: Helayne Seminole, MD;  Location: [facility];  Service: ENT;  Laterality: N/A;  . TRANSTHORACIC ECHOCARDIOGRAM  08/2014   Normal LV size and function.  Mild LVH.  EF 55-60%.  Normal regional wall motion.  GR 1 DD.  Normal RV size and function .  [name] TUBAL LIGATION     with reversal in 1994  . VENTRAL HERNIA REPAIR      There were no vitals filed for this visit.   Subjective Assessment - 11/12/20 2134    Subjective Pt cancelled appt on Monday, she had  increased pain in L thigh, went to MD to have checked out, states pain is back to normal now. Mild/moderate swelling in L knee and thigh.    Currently in Pain? Yes    Pain Score 4     Pain Location Leg    Pain Orientation Left    Pain Descriptors / Indicators Aching    Pain Type Acute pain    Pain Onset More than a month ago    Pain Frequency Intermittent                             [facility] Adult PT Treatment/Exercise - 11/12/20 2138      Ambulation/Gait   Gait Comments slow speed with RW      Exercises   Exercises Knee/Hip      Knee/Hip Exercises: Aerobic   Recumbent Bike L 1 x 6 min      Knee/Hip Exercises: Standing   Hip Flexion 20 reps;Knee bent    Hip Abduction 10 reps;Both    Other Standing Knee Exercises L/R weight shifts x 20;      Knee/Hip Exercises: Seated   Sit to Sand 10 reps   with education on form, and equal weightbearing on L and R.     Knee/Hip Exercises: Supine   Quad Sets 20 reps    Heel Slides 20 reps;Left    Straight Leg Raises 2 sets;10 reps;Both    Other Supine Knee/Hip Exercises Supine March x 20;  Clam GTB x 20;      Manual Therapy   Manual Therapy Soft  tissue mobilization;Passive ROM    Soft tissue mobilization STM and IASTM to lateral thigh, quad and ITB    Passive ROM for L knee flexion                    PT Short Term Goals - 11/12/20 2107      PT SHORT TERM GOAL #1   Title Pt to be independent with initial HEP    Time 2    Period Weeks    Status New    Target Date 11/17/20      PT SHORT TERM GOAL #2   Title Pt to demo improved ROM of L knee by at 10 degrees    Time 4    Period Weeks    Status New    Target Date 11/17/20             PT Long Term Goals - 11/12/20 2108      PT LONG TERM GOAL #1   Title Pt to be independent with HEP    Time 6    Period Weeks    Status New    Target Date 12/15/20      PT LONG TERM GOAL #2   Title Pt to demo L knee ROM to be at least 115 deg    Time 6    Period Weeks    Status New    Target Date 12/15/20      PT LONG TERM GOAL #3   Title Pt to demo L quad at hip strength to be at least 4+/5 to improve stability and gait    Time 6    Period Weeks    Status New    Target Date 12/15/20      PT LONG TERM GOAL #4   Title Pt to demo stability and [place] of L LE to be  WNL with gait, dynamic balance and single leg stance    Time 6    Period Weeks    Status New    Target Date 12/15/20                 Plan - 11/12/20 2137    Clinical Impression Statement Pt with minimal pain during session today. Mild tenderness with STM of thigh and ITB. Does have stiffness and swelling in L knee that is limiting full ROM. Pt with instability in L LE noted with SLS phase of march and standing hip abd today. Plan to progress strength as tolerated.    Personal Factors and Comorbidities Fitness;Comorbidity 1    Comorbidities surgery with complications, L TKA already present    Examination-Activity Limitations Stand;Stairs;Squat;Locomotion Level    Examination-Participation Restrictions Cleaning;Community Activity;Shop    Stability/Clinical Decision Making Evolving/Moderate complexity     Rehab Potential Good    PT Frequency 2x / week    PT Duration 6 weeks    PT Treatment/Interventions ADLs/Self Care Home Management;Cryotherapy;Electrical Stimulation;DME Instruction;Ultrasound;Moist Heat;Iontophoresis 4mg /ml Dexamethasone;Gait training;Stair training;Functional mobility training;Therapeutic activities;Therapeutic exercise;Balance training;Patient/family education;Neuromuscular re-education;Manual techniques;Passive range of motion;Dry needling;Taping;Vasopneumatic Device;Spinal Manipulations;Joint Manipulations    Consulted and Agree with Plan of Care Patient           Patient will benefit from skilled therapeutic intervention in order to improve the following deficits and impairments:  Abnormal gait,Decreased range of motion,Difficulty walking,Increased muscle spasms,Decreased activity tolerance,Pain,Decreased balance,Impaired flexibility,Hypomobility,Decreased mobility,Decreased strength,Increased edema  Visit Diagnosis: Muscle weakness (generalized)  Pain in left leg  Stiffness of knee joint, left     Problem List Patient Active Problem List   Diagnosis Date Noted  . Iron deficiency anemia 10/01/2020  . Postural dizziness with presyncope 09/30/2020  . Hypertriglyceridemia 04/02/2020  . Aortic atherosclerosis ([place]) 02/14/2020  . Nontraumatic complete tear of left rotator cuff   . Diabetic neuropathy ([place]) 02/20/2019  . Severe persistent asthma 01/04/2019  . Subcortical microvascular ischemic occlusive disease 07/12/2018  . Rapid palpitations 07/12/2018  . Impingement of left ankle joint 06/26/2018  . Constipation 04/21/2018  . Total knee replacement status, left 12/07/2017  . Dysphagia 09/20/2017  . Epiglottitis   . Plantar fasciitis, right 07/13/2017  . S/P total knee arthroplasty, right 06/29/2017  . Osteoarthrosis, localized, primary, knee, right   . Sprain of calcaneofibular ligament of right ankle 06/06/2017  . Osteoarthritis of right knee  01/26/2017  . Onychomycosis 10/27/2015  . CKD (chronic kidney disease) stage 3, GFR 30-59 ml/min (HCC) 04/27/2015  . MRSA (methicillin resistant staph aureus) culture positive 03/27/2015  . History of osteomyelitis 03/27/2015  . Fatty liver 09/30/2014  . Hot flashes 07/15/2014  . Abnormal SPEP 04/17/2014  . Fracture of left leg 04/17/2014  . Cushingoid side effect of steroids ([place]) 04/17/2014  . Internal hemorrhoids   . Pulmonary sarcoidosis ([place])   . Major depression in full remission ([place])   . Preoperative clearance 03/25/2014  . Obstructive chronic bronchitis without exacerbation ([place]) 09/18/2013  . Chest pain 04/11/2013  . Hypertensive heart disease with chronic diastolic congestive heart failure ([place])   . Solitary pulmonary nodule, on CT 02/2013 - stable over 2 years in 2015 02/20/2013  . Gout 08/20/2010  . Deficiency anemia 09/18/2009  . Morbid obesity ([place]) 06/04/2009  . Sleep apnea 04/21/2009  . Sarcoidosis of lung ([place]) 04/10/2007  . Hyperlipidemia associated with type 2 diabetes mellitus ([place]) 08/21/2006  . Hypertension associated with diabetes ([place]) 08/21/2006  . GERD 08/21/2006  . Type II diabetes  mellitus with renal manifestations ([place]) 08/21/2006   Lyndee Hensen, PT, DPT 9:39 PM  11/12/20    [facility] [place], [name], [address] Phone: [phone]   Fax:  [phone]  Name: [name] MRN: [id number] Date of Birth: Jul 12, 1961

## 2020-11-14 ENCOUNTER — Other Ambulatory Visit: Payer: Self-pay

## 2020-11-15 ENCOUNTER — Encounter: Payer: Self-pay | Admitting: Cardiology

## 2020-11-15 NOTE — Assessment & Plan Note (Signed)
There was concerned about possible fall from orthostatic hypotension postural dizziness.  This was back in December 2021.  She says she is also reporting going down the stairs.  But her diuretic dose was reduced to once daily.  We also discussed importance of staying adequately hydrated.  If she does feel lightheaded and dizzy, she is okay to hold which ever antihypertensive agent is up next course of the day.

## 2020-11-15 NOTE — Assessment & Plan Note (Signed)
Palpitations seem to be pretty well controlled with a combination of carvedilol and low-dose diltiazem.  Diltiazem is being used to help with potential treatment for pulmonary hypertension.

## 2020-11-15 NOTE — Assessment & Plan Note (Addendum)
Stable, doing well.  Trivial edema.  She is now taking her furosemide once daily and doing relatively well with it. Blood pressure is well controlled on carvedilol and telmisartan along with diltiazem. ==> Okay to stop Imdur  She is on GLP-1 agonist for her diabetes to help with weight loss, question if she would potentially benefit from SGLT two inhibitor for additional fluid management.-Defer to PCP.[name]  We discussed sliding scale Lasix the where she can take an act extra dose of Lasix if necessary based on weight gain, worsening dyspnea/PND/orthopnea or edema.

## 2020-11-15 NOTE — Assessment & Plan Note (Addendum)
Most recent lipid panel from June shows her LDL weight and 84.  Back in the right direction.  She remains on multiple medications including atorvastatin along with fenofibrate (for TG).  I had plan to switch her over to Vascepa, but apparently this was not covered by insurance therefore she was placed back on fenofibrate.  [facility] medication-actually the only drug with indication for hyperlipidemia triglyceridemia with cardiovascular benefit, but not covered by insurance)  Her triglycerides are still high, but not as bad with type of dietary modification.  If this episodes not covered, could please try to consider over-the-counter high-dose fish oil (3 g a day).

## 2020-11-17 ENCOUNTER — Ambulatory Visit (INDEPENDENT_AMBULATORY_CARE_PROVIDER_SITE_OTHER): Payer: 59 | Admitting: Physical Therapy

## 2020-11-17 ENCOUNTER — Other Ambulatory Visit: Payer: Self-pay

## 2020-11-17 ENCOUNTER — Encounter: Payer: Self-pay | Admitting: Physical Therapy

## 2020-11-17 DIAGNOSIS — M25662 Stiffness of left knee, not elsewhere classified: Secondary | ICD-10-CM

## 2020-11-17 DIAGNOSIS — R531 Weakness: Secondary | ICD-10-CM | POA: Diagnosis not present

## 2020-11-17 DIAGNOSIS — M6281 Muscle weakness (generalized): Secondary | ICD-10-CM | POA: Diagnosis not present

## 2020-11-17 DIAGNOSIS — M79605 Pain in left leg: Secondary | ICD-10-CM

## 2020-11-17 NOTE — Therapy (Signed)
[facility] [address] [place], [place], [address] Phone: [phone]   Fax:  [phone]  Physical Therapy Treatment  Patient Details  Name: [name] MRN: [id number] Date of Birth: [birth year] Referring Provider (PT): Tana Conch   Encounter Date: 11/17/2020   PT End of Session - 11/17/20 1441    Visit Number 3    Number of Visits 12    Date for PT Re-Evaluation 12/15/20    Authorization Type U[employer]    PT Start Time 1435    PT Stop Time 1515    PT Time Calculation (min) 40 min    Activity Tolerance Patient tolerated treatment well    Behavior During Therapy [facility] for tasks assessed/performed           Past Medical History:  Diagnosis Date  . Abnormal SPEP 04/17/2014  . Acute left ankle pain 01/26/2017  . ANEMIA-UNSPECIFIED 09/18/2009  . Anxiety   . Arthritis   . Chronic diastolic heart failure, NYHA class 2 (HCC)    Normal LVEDP by May 2018  . COPD (chronic obstructive pulmonary disease) (HCC)   . Depression   . DIABETES MELLITUS, TYPE II 08/21/2006  . Diabetic osteomyelitis (HCC) 05/29/2015  . Fatty liver   . Fracture of 5th metatarsal    non union  . GERD 08/21/2006   Had fundoplication  . GOUT 08/20/2010  . Hammer toe of right foot    2-5th toes  . Hx of umbilical hernia repair   . HYPERLIPIDEMIA 08/21/2006  . HYPERTENSION 08/21/2006  . Infection of wound due to methicillin resistant Staphylococcus aureus (MRSA)   . Internal hemorrhoids   . Kidney problem   . Multiple allergies 10/14/2016  . OBESITY 06/04/2009  . Onychomycosis 10/27/2015  . Osteomyelitis of left foot (HCC) 05/29/2015  . Pulmonary sarcoidosis (HCC)    Followed locally by pulmonology, but also by Dr. Sandy Salaam at [facility]  . Right knee pain 01/26/2017  . Vocal cord dysfunction   . Wears partial dentures     Past Surgical History:  Procedure Laterality Date  . ABDOMINAL HYSTERECTOMY    . APPENDECTOMY    . BLADDER SUSPENSION   11/11/2011   Procedure: TRANSVAGINAL TAPE (TVT) PROCEDURE;  Surgeon: Levi Aland, MD;  Location: [facility];  Service: Gynecology;  Laterality: N/A;  . CAROTIDS  02/18/11   CAROTID DUPLEX; VERTEBRALS ARE PATENT WITH ANTEGRADE FLOW. ICA/CCA RATIO 1.61 ON RIGHT AND 0.75 ON LEFT  . CHOLECYSTECTOMY  1984  . COLONOSCOPY  04/29/2010   Marina Goodell  . CYSTOSCOPY  11/11/2011   Procedure: CYSTOSCOPY;  Surgeon: Levi Aland, MD;  Location: [facility];  Service: Gynecology;  Laterality: N/A;  . EXTUBATION (ENDOTRACHEAL) IN OR N/A 09/23/2017   Procedure: EXTUBATION (ENDOTRACHEAL) IN OR;  Surgeon: Graylin Shiver, MD;  Location: [facility];  Service: ENT;  Laterality: N/A;  . FIBEROPTIC LARYNGOSCOPY AND TRACHEOSCOPY N/A 09/23/2017   Procedure: FLEXIBLE FIBEROPTIC LARYNGOSCOPY;  Surgeon: Graylin Shiver, MD;  Location: [facility];  Service: ENT;  Laterality: N/A;  . FRACTURE SURGERY     foot  . HALLUX FUSION Left 10/02/2018   Procedure: HALLUX ARTHRODESIS;  Surgeon: Felecia Shelling, DPM;  Location: [facility];  Service: Podiatry;  Laterality: Left;  . HAMMER TOE SURGERY Left 10/02/2018   Procedure: HAMMER TOE CORRECTION 2ND 3RD 4RD FIFTH TOE;  Surgeon: Felecia Shelling, DPM;  Location: [facility];  Service: Podiatry;  Laterality: Left;  . HAMMER TOE SURGERY Right 04/12/2019  Procedure: HAMMER TOE CORRECTION, 2ND, 3RD, 4TH AND 5TH TOES OF RIGHT FOOT;  Surgeon: Edrick Kins, DPM;  Location: [place];  Service: Podiatry;  Laterality: Right;  . HAMMER TOE SURGERY Left 10/25/2019   Procedure: HAMMER TOE REPAIR SECOND, THIRD, FOUTH;  Surgeon: Edrick Kins, DPM;  Location: [place] OR;  Service: Podiatry;  Laterality: Left;  . HERNIA REPAIR    . I & D EXTREMITY Left 06/27/2015   Procedure: Partial Excision Left Calcaneus, Place Antibiotic Beads, and Wound VAC;  Surgeon: Newt Minion, MD;  Location: [place];  Service: Orthopedics;  Laterality: Left;  . JOINT REPLACEMENT    . KNEE ARTHROSCOPY     right  . LEFT AND RIGHT HEART  CATHETERIZATION WITH CORONARY ANGIOGRAM N/A 04/23/2013   Procedure: LEFT AND RIGHT HEART CATHETERIZATION WITH CORONARY ANGIOGRAM;  Surgeon: Leonie Man, MD;  Location: [facility] C[facility];  Service: Cardiovascular;  Laterality: N/A;  . LEFT HEART CATH AND CORONARY ANGIOGRAPHY N/A 03/11/2017   Procedure: Left Heart Cath and Coronary Angiography;  Surgeon: Sherren Mocha, MD;  Location: [place] CV LAB; angiographically minimal CAD in the LAD otherwise normal.;  Normal LVEDP.  FALSE POSITIVE MYOVIEW  . LEFT HEART CATH AND CORONARY ANGIOGRAPHY  07/20/2010   LVEF 50-55% WITH VERY MILD GLOBAL HYPOKINESIA; ESSENTIALLY NORMAL CORONARY ARTERIES; NORMAL LV FUNCTION  . METATARSAL OSTEOTOMY WITH OPEN REDUCTION INTERNAL FIXATION (ORIF) METATARSAL WITH FUSION Left 04/09/2014   Procedure: LEFT FOOT FRACTURE OPEN TREATMENT METATARSAL INCLUDES INTERNAL FIXATION EACH;  Surgeon: Lorn Junes, MD;  Location: [place];  Service: Orthopedics;  Laterality: Left;  . NISSEN FUNDOPLICATION  2979  . NM MYOVIEW LTD --> FALSE POSITIVE  03/10/2017   : Moderate size "stress-induced "perfusion defect at the apex as well as "ill-defined stress-induced perfusion defect in the lateral wall.  EF 55%.  INTERMEDIATE risk. -->  FALSE POSITIVE  . ORIF DISTAL FEMUR FRACTURE  08/08/2020   [facility]: ORIF of left extra-articular periprosthetic distal femur fracture with synthesis of the femur plate with cortical nonlocking screws.  (Thought to be pathologic fracture due to osteoporosis) -> after a fall  . ORIF FEMUR W/ PERI-IMPLANT  08/29/2020   [facility]) revision of left femur fracture ORIF plate fixation screws; culture positive for MSSA  . Right and left CARDIAC CATHETERIZATION  04/23/2013   Angiographic normal coronaries; LVEDP 20 mmHg, PCWP 12-14 mmHg, RAP 12 mmHg.; Fick CO/CI 4.9/2.2  . SHOULDER ARTHROSCOPY Left 03/14/2019   Procedure: LEFT SHOULDER ARTHROSCOPY, DEBRIDEMENT, AND  DECOMPRESSION;  Surgeon: Newt Minion, MD;  Location: [place];  Service: Orthopedics;  Laterality: Left;  . TOTAL KNEE ARTHROPLASTY Right 06/29/2017   Procedure: RIGHT TOTAL KNEE ARTHROPLASTY;  Surgeon: Newt Minion, MD;  Location: [place];  Service: Orthopedics;  Laterality: Right;  . TOTAL KNEE ARTHROPLASTY Left 12/07/2017  . TOTAL KNEE ARTHROPLASTY Left 12/07/2017   Procedure: LEFT TOTAL KNEE ARTHROPLASTY;  Surgeon: Newt Minion, MD;  Location: [place];  Service: Orthopedics;  Laterality: Left;  . TRACHEOSTOMY TUBE PLACEMENT N/A 09/20/2017   Procedure: AWAKE INTUBATION WITH ANESTHESIA WITH VIDEO ASSISTANCE;  Surgeon: Helayne Seminole, MD;  Location: [place] OR;  Service: ENT;  Laterality: N/A;  . TRANSTHORACIC ECHOCARDIOGRAM  08/2014   Normal LV size and function.  Mild LVH.  EF 55-60%.  Normal regional wall motion.  GR 1 DD.  Normal RV size and function .  [name] TRANSTHORACIC ECHOCARDIOGRAM  08/12/2020    [facility]  [facility]): Normal LV size and function.  EF 55 to 60%.  No RWM A.  Normal filling pattern.  Normal RV.  No significant valvular disease-stenosis or regurgitation.  No vegetation.  . TUBAL LIGATION     with reversal in 1994  . VENTRAL HERNIA REPAIR      There were no vitals filed for this visit.   Subjective Assessment - 11/17/20 1440    Subjective Pt states knee doing a bit better.    Currently in Pain? Yes    Pain Score 4     Pain Location Leg    Pain Orientation Left    Pain Descriptors / Indicators Aching    Pain Type Acute pain    Pain Onset More than a month ago    Pain Frequency Intermittent                             [facility] Adult PT Treatment/Exercise - 11/17/20 1436      Ambulation/Gait   Gait Comments 35 ft x 4 with SPC and x 4 with No AD;      Exercises   Exercises Knee/Hip      Knee/Hip Exercises: Aerobic   Recumbent Bike L 1 x 8 min      Knee/Hip Exercises: Standing   Hip Flexion 20 reps;Knee bent    Hip Abduction 10 reps;Both;2  sets    Forward Step Up 10 reps;Hand Hold: 1;Step Height: 6"    Stairs up/down 5 steps 1 HR, x 3;      Knee/Hip Exercises: Seated   Sit to Sand 10 reps      Knee/Hip Exercises: Supine   Quad Sets --    Heel Slides 20 reps;Left    Heel Slides Limitations with strap    Straight Leg Raises 2 sets;10 reps;Both    Other Supine Knee/Hip Exercises --      Knee/Hip Exercises: Sidelying   Hip ABduction 10 reps;Both      Manual Therapy   Manual Therapy Soft tissue mobilization;Passive ROM    Soft tissue mobilization STM and IASTM to lateral thigh, quad and ITB    Passive ROM for L knee flexion                    PT Short Term Goals - 11/12/20 2107      PT SHORT TERM GOAL #1   Title Pt to be independent with initial HEP    Time 2    Period Weeks    Status New    Target Date 11/17/20      PT SHORT TERM GOAL #2   Title Pt to demo improved ROM of L knee by at 10 degrees    Time 4    Period Weeks    Status New    Target Date 11/17/20             PT Long Term Goals - 11/12/20 2108      PT LONG TERM GOAL #1   Title Pt to be independent with HEP    Time 6    Period Weeks    Status New    Target Date 12/15/20      PT LONG TERM GOAL #2   Title Pt to demo L knee ROM to be at least 115 deg    Time 6    Period Weeks    Status New    Target Date  12/15/20      PT LONG TERM GOAL #3   Title Pt to demo L quad at hip strength to be at least 4+/5 to improve stability and gait    Time 6    Period Weeks    Status New    Target Date 12/15/20      PT LONG TERM GOAL #4   Title Pt to demo stability and [place] of L LE to be WNL with gait, dynamic balance and single leg stance    Time 6    Period Weeks    Status New    Target Date 12/15/20                 Plan - 11/17/20 1609    Clinical Impression Statement Pt with improved weight bearing on L LE today and improved stability with standing ther ex. ALso has improved ability for ambulation with no AD. Recommended  use of SPC when going out of house.    Personal Factors and Comorbidities Fitness;Comorbidity 1    Comorbidities surgery with complications, L TKA already present    Examination-Activity Limitations Stand;Stairs;Squat;Locomotion Level    Examination-Participation Restrictions Cleaning;Community Activity;Shop    Stability/Clinical Decision Making Evolving/Moderate complexity    Rehab Potential Good    PT Frequency 2x / week    PT Duration 6 weeks    PT Treatment/Interventions ADLs/Self Care Home Management;Cryotherapy;Electrical Stimulation;DME Instruction;Ultrasound;Moist Heat;Iontophoresis 4mg /ml Dexamethasone;Gait training;Stair training;Functional mobility training;Therapeutic activities;Therapeutic exercise;Balance training;Patient/family education;Neuromuscular re-education;Manual techniques;Passive range of motion;Dry needling;Taping;Vasopneumatic Device;Spinal Manipulations;Joint Manipulations    Consulted and Agree with Plan of Care Patient           Patient will benefit from skilled therapeutic intervention in order to improve the following deficits and impairments:  Abnormal gait,Decreased range of motion,Difficulty walking,Increased muscle spasms,Decreased activity tolerance,Pain,Decreased balance,Impaired flexibility,Hypomobility,Decreased mobility,Decreased strength,Increased edema  Visit Diagnosis: Muscle weakness (generalized)  Pain in left leg  Stiffness of knee joint, left  Generalized weakness     Problem List Patient Active Problem List   Diagnosis Date Noted  . Iron deficiency anemia 10/01/2020  . Postural dizziness with presyncope 09/30/2020  . Hypertriglyceridemia 04/02/2020  . Aortic atherosclerosis ([place]) 02/14/2020  . Nontraumatic complete tear of left rotator cuff   . Diabetic neuropathy ([place]) 02/20/2019  . Severe persistent asthma 01/04/2019  . Subcortical microvascular ischemic occlusive disease 07/12/2018  . Rapid palpitations 07/12/2018  .  Impingement of left ankle joint 06/26/2018  . Constipation 04/21/2018  . Total knee replacement status, left 12/07/2017  . Dysphagia 09/20/2017  . Epiglottitis   . Plantar fasciitis, right 07/13/2017  . S/P total knee arthroplasty, right 06/29/2017  . Osteoarthrosis, localized, primary, knee, right   . Sprain of calcaneofibular ligament of right ankle 06/06/2017  . Osteoarthritis of right knee 01/26/2017  . Onychomycosis 10/27/2015  . CKD (chronic kidney disease) stage 3, GFR 30-59 ml/min (HCC) 04/27/2015  . MRSA (methicillin resistant staph aureus) culture positive 03/27/2015  . History of osteomyelitis 03/27/2015  . Fatty liver 09/30/2014  . Hot flashes 07/15/2014  . Abnormal SPEP 04/17/2014  . Fracture of left leg 04/17/2014  . Cushingoid side effect of steroids ([place]) 04/17/2014  . Internal hemorrhoids   . Pulmonary sarcoidosis ([place])   . Major depression in full remission ([place])   . Preoperative clearance 03/25/2014  . Obstructive chronic bronchitis without exacerbation ([place]) 09/18/2013  . Chest pain 04/11/2013  . Hypertensive heart disease with chronic diastolic congestive heart failure ([place])   . Solitary pulmonary nodule, on CT 02/2013 -  stable over 2 years in 2015 02/20/2013  . Gout 08/20/2010  . Deficiency anemia 09/18/2009  . Morbid obesity ([place]) 06/04/2009  . Sleep apnea 04/21/2009  . Sarcoidosis of lung ([place]) 04/10/2007  . Hyperlipidemia associated with type 2 diabetes mellitus ([place]) 08/21/2006  . Hypertension associated with diabetes ([place]) 08/21/2006  . GERD 08/21/2006  . Type II diabetes mellitus with renal manifestations ([place]) 08/21/2006    Lyndee Hensen, PT, DPT 4:11 PM  11/17/20    [facility] [place], [name], [address] Phone: [phone]   Fax:  [phone]  Name: [name] MRN: [id number] Date of Birth: 07-14-1961

## 2020-11-18 ENCOUNTER — Ambulatory Visit: Payer: 59 | Admitting: Endocrinology

## 2020-11-19 ENCOUNTER — Other Ambulatory Visit: Payer: Self-pay

## 2020-11-19 ENCOUNTER — Ambulatory Visit (INDEPENDENT_AMBULATORY_CARE_PROVIDER_SITE_OTHER): Payer: 59 | Admitting: Physical Therapy

## 2020-11-19 ENCOUNTER — Encounter: Payer: Self-pay | Admitting: Physical Therapy

## 2020-11-19 DIAGNOSIS — M6281 Muscle weakness (generalized): Secondary | ICD-10-CM

## 2020-11-19 DIAGNOSIS — R531 Weakness: Secondary | ICD-10-CM | POA: Diagnosis not present

## 2020-11-19 DIAGNOSIS — M25662 Stiffness of left knee, not elsewhere classified: Secondary | ICD-10-CM | POA: Diagnosis not present

## 2020-11-19 DIAGNOSIS — M79605 Pain in left leg: Secondary | ICD-10-CM

## 2020-11-19 NOTE — Therapy (Signed)
[place], [name], [address] Phone: [phone]   Fax:  [phone]  Physical Therapy Treatment  Patient Details  Name: [name] MRN: [id number] Date of Birth: 1960/10/21 Referring Provider (PT): Garret Reddish   Encounter Date: 11/19/2020   PT End of Session - 11/19/20 1510    Visit Number 4    Number of Visits 12    Date for PT Re-Evaluation 12/15/20    Authorization Type U[employer]    PT Start Time 1435    PT Stop Time 1450    PT Time Calculation (min) 15 min    Activity Tolerance Patient tolerated treatment well    Behavior During Therapy [facility] for tasks assessed/performed           Past Medical History:  Diagnosis Date  . Abnormal SPEP 04/17/2014  . Acute left ankle pain 01/26/2017  . ANEMIA-UNSPECIFIED 09/18/2009  . Anxiety   . Arthritis   . Chronic diastolic heart failure, NYHA class 2 (HCC)    Normal LVEDP by May 2018  . COPD (chronic obstructive pulmonary disease) ([place])   . Depression   . DIABETES MELLITUS, TYPE II 08/21/2006  . Diabetic osteomyelitis ([place]) 05/29/2015  . Fatty liver   . Fracture of 5th metatarsal    non union  . GERD 11/94/1740   Had fundoplication  . GOUT 08/20/2010  . Hammer toe of right foot    2-5th toes  . Hx of umbilical hernia repair   . HYPERLIPIDEMIA 08/21/2006  . HYPERTENSION 08/21/2006  . Infection of wound due to methicillin resistant Staphylococcus aureus (MRSA)   . Internal hemorrhoids   . Kidney problem   . Multiple allergies 10/14/2016  . OBESITY 06/04/2009  . Onychomycosis 10/27/2015  . Osteomyelitis of left foot ([place]) 05/29/2015  . Pulmonary sarcoidosis ([place])    Followed locally by pulmonology, but also by Dr. Casper Harrison at [facility]  . Right knee pain 01/26/2017  . Vocal cord dysfunction   . Wears partial dentures     Past Surgical History:  Procedure Laterality Date  . ABDOMINAL HYSTERECTOMY    . APPENDECTOMY    . BLADDER SUSPENSION   11/11/2011   Procedure: TRANSVAGINAL TAPE (TVT) PROCEDURE;  Surgeon: Olga Millers, MD;  Location: [facility];  Service: Gynecology;  Laterality: N/A;  . CAROTIDS  02/18/11   CAROTID DUPLEX; VERTEBRALS ARE PATENT WITH ANTEGRADE FLOW. ICA/CCA RATIO 1.61 ON RIGHT AND 0.75 ON LEFT  . CHOLECYSTECTOMY  1984  . COLONOSCOPY  04/29/2010   [name]  . CYSTOSCOPY  11/11/2011   Procedure: CYSTOSCOPY;  Surgeon: Olga Millers, MD;  Location: [facility];  Service: Gynecology;  Laterality: N/A;  . EXTUBATION (ENDOTRACHEAL) IN OR N/A 09/23/2017   Procedure: EXTUBATION (ENDOTRACHEAL) IN OR;  Surgeon: Helayne Seminole, MD;  Location: [place];  Service: ENT;  Laterality: N/A;  . FIBEROPTIC LARYNGOSCOPY AND TRACHEOSCOPY N/A 09/23/2017   Procedure: FLEXIBLE FIBEROPTIC LARYNGOSCOPY;  Surgeon: Helayne Seminole, MD;  Location: [place];  Service: ENT;  Laterality: N/A;  . FRACTURE SURGERY     foot  . HALLUX FUSION Left 10/02/2018   Procedure: HALLUX ARTHRODESIS;  Surgeon: Edrick Kins, DPM;  Location: [facility];  Service: Podiatry;  Laterality: Left;  . HAMMER TOE SURGERY Left 10/02/2018   Procedure: HAMMER TOE CORRECTION 2ND 3RD 4RD FIFTH TOE;  Surgeon: Edrick Kins, DPM;  Location: [facility];  Service: Podiatry;  Laterality: Left;  . HAMMER TOE SURGERY Right 04/12/2019  Procedure: HAMMER TOE CORRECTION, 2ND, 3RD, 4TH AND 5TH TOES OF RIGHT FOOT;  Surgeon: Edrick Kins, DPM;  Location: [place];  Service: Podiatry;  Laterality: Right;  . HAMMER TOE SURGERY Left 10/25/2019   Procedure: HAMMER TOE REPAIR SECOND, THIRD, FOUTH;  Surgeon: Edrick Kins, DPM;  Location: [facility];  Service: Podiatry;  Laterality: Left;  . HERNIA REPAIR    . I & D EXTREMITY Left 06/27/2015   Procedure: Partial Excision Left Calcaneus, Place Antibiotic Beads, and Wound VAC;  Surgeon: Newt Minion, MD;  Location: [place];  Service: Orthopedics;  Laterality: Left;  . JOINT REPLACEMENT    . KNEE ARTHROSCOPY     right  . LEFT AND RIGHT HEART  CATHETERIZATION WITH CORONARY ANGIOGRAM N/A 04/23/2013   Procedure: LEFT AND RIGHT HEART CATHETERIZATION WITH CORONARY ANGIOGRAM;  Surgeon: Leonie Man, MD;  Location: [facility];  Service: Cardiovascular;  Laterality: N/A;  . LEFT HEART CATH AND CORONARY ANGIOGRAPHY N/A 03/11/2017   Procedure: Left Heart Cath and Coronary Angiography;  Surgeon: Sherren Mocha, MD;  Location: [place] [facility]; angiographically minimal CAD in the LAD otherwise normal.;  Normal LVEDP.  FALSE POSITIVE MYOVIEW  . LEFT HEART CATH AND CORONARY ANGIOGRAPHY  07/20/2010   LVEF 50-55% WITH VERY MILD GLOBAL HYPOKINESIA; ESSENTIALLY NORMAL CORONARY ARTERIES; NORMAL LV FUNCTION  . METATARSAL OSTEOTOMY WITH OPEN REDUCTION INTERNAL FIXATION (ORIF) METATARSAL WITH FUSION Left 04/09/2014   Procedure: LEFT FOOT FRACTURE OPEN TREATMENT METATARSAL INCLUDES INTERNAL FIXATION EACH;  Surgeon: Lorn Junes, MD;  Location: [place];  Service: Orthopedics;  Laterality: Left;  . NISSEN FUNDOPLICATION  2979  . NM MYOVIEW LTD --> FALSE POSITIVE  03/10/2017   : Moderate size "stress-induced "perfusion defect at the apex as well as "ill-defined stress-induced perfusion defect in the lateral wall.  EF 55%.  INTERMEDIATE risk. -->  FALSE POSITIVE  . ORIF DISTAL FEMUR FRACTURE  08/08/2020   [facility]: ORIF of left extra-articular periprosthetic distal femur fracture with synthesis of the femur plate with cortical nonlocking screws.  (Thought to be pathologic fracture due to osteoporosis) -> after a fall  . ORIF FEMUR W/ PERI-IMPLANT  08/29/2020   [facility]) revision of left femur fracture ORIF plate fixation screws; culture positive for MSSA  . Right and left CARDIAC CATHETERIZATION  04/23/2013   Angiographic normal coronaries; LVEDP 20 mmHg, PCWP 12-14 mmHg, RAP 12 mmHg.; Fick CO/CI 4.9/2.2  . SHOULDER ARTHROSCOPY Left 03/14/2019   Procedure: LEFT SHOULDER ARTHROSCOPY, DEBRIDEMENT, AND  DECOMPRESSION;  Surgeon: Newt Minion, MD;  Location: [place];  Service: Orthopedics;  Laterality: Left;  . TOTAL KNEE ARTHROPLASTY Right 06/29/2017   Procedure: RIGHT TOTAL KNEE ARTHROPLASTY;  Surgeon: Newt Minion, MD;  Location: [place];  Service: Orthopedics;  Laterality: Right;  . TOTAL KNEE ARTHROPLASTY Left 12/07/2017  . TOTAL KNEE ARTHROPLASTY Left 12/07/2017   Procedure: LEFT TOTAL KNEE ARTHROPLASTY;  Surgeon: Newt Minion, MD;  Location: [place];  Service: Orthopedics;  Laterality: Left;  . TRACHEOSTOMY TUBE PLACEMENT N/A 09/20/2017   Procedure: AWAKE INTUBATION WITH ANESTHESIA WITH VIDEO ASSISTANCE;  Surgeon: Helayne Seminole, MD;  Location: [facility] [facility];  Service: ENT;  Laterality: N/A;  . TRANSTHORACIC ECHOCARDIOGRAM  08/2014   Normal LV size and function.  Mild LVH.  EF 55-60%.  Normal regional wall motion.  GR 1 DD.  Normal RV size and function .  [name] TRANSTHORACIC ECHOCARDIOGRAM  08/12/2020    [facility] Normal LV size and function.  EF 55 to 60%.  No RWM A.  Normal filling pattern.  Normal RV.  No significant valvular disease-stenosis or regurgitation.  No vegetation.  . TUBAL LIGATION     with reversal in 1994  . VENTRAL HERNIA REPAIR      There were no vitals filed for this visit.   Subjective Assessment - 11/19/20 1447    Subjective Pt states she was "a little sore" yesterday after therapy session, but felt better this morning. Pt states that her knee began hurting more than usual on the lateral aspect after sitting in her recliner today. Pt states she noticed that it has become more inflammed and swollen.    Currently in Pain? Yes    Pain Score 7     Pain Location Knee    Pain Orientation Left    Pain Descriptors / Indicators Sharp;Aching                             [facility] Adult PT Treatment/Exercise - 11/19/20 1446      Ambulation/Gait   Gait Comments 35 ft x 4 with SPC and x 4 with No AD;   on hold due to status change     Exercises    Exercises Knee/Hip      Knee/Hip Exercises: Aerobic   Recumbent Bike L 1 x 8 min   on hold due to status change     Knee/Hip Exercises: Standing   Hip Flexion 20 reps;Knee bent   on hold due to status change   Hip Abduction 10 reps;Both;2 sets   on hold due to status change   Forward Step Up 10 reps;Hand Hold: 1;Step Height: 6"   on hold due to status change   Stairs up/down 5 steps 1 HR, x 3;   on hold due to status change     Knee/Hip Exercises: Seated   Sit to Sand 10 reps   on hold due to status change     Knee/Hip Exercises: Supine   Heel Slides 20 reps;Left   on hold due to status change   Heel Slides Limitations with strap    Straight Leg Raises 2 sets;10 reps;Both   on hold due to status change     Knee/Hip Exercises: Sidelying   Hip ABduction 10 reps;Both   on hold due to status change     Manual Therapy   Manual Therapy Soft tissue mobilization;Passive ROM    Soft tissue mobilization STM and IASTM to lateral thigh, quad and ITB    Passive ROM for L knee flexion                  PT Education - 11/19/20 1453    Education Details HEP update, referral back to MD re acute onset of knee pain, POC, thermotherapy, AD usage            PT Short Term Goals - 11/12/20 2107      PT SHORT TERM GOAL #1   Title Pt to be independent with initial HEP    Time 2    Period Weeks    Status New    Target Date 11/17/20      PT SHORT TERM GOAL #2   Title Pt to demo improved ROM of L knee by at 10 degrees    Time 4    Period Weeks    Status New  Target Date 11/17/20             PT Long Term Goals - 11/12/20 2108      PT LONG TERM GOAL #1   Title Pt to be independent with HEP    Time 6    Period Weeks    Status New    Target Date 12/15/20      PT LONG TERM GOAL #2   Title Pt to demo L knee ROM to be at least 115 deg    Time 6    Period Weeks    Status New    Target Date 12/15/20      PT LONG TERM GOAL #3   Title Pt to demo L quad at hip strength to  be at least 4+/5 to improve stability and gait    Time 6    Period Weeks    Status New    Target Date 12/15/20      PT LONG TERM GOAL #4   Title Pt to demo stability and Ball of L LE to be WNL with gait, dynamic balance and single leg stance    Time 6    Period Weeks    Status New    Target Date 12/15/20                 Plan - 11/19/20 1511    Clinical Impression Statement Pt with very tender area over lateral/distal incision with palpation. Also mild soreness at distal quad. Pt with increased pain with movment, as well as weight bearing. due to severity of pts previous surgery, recommended she f/u with MD tomorrow for this, to ensure hardware is okay. Discussed icing and rest until then. No further exercises done today.    Personal Factors and Comorbidities Fitness;Comorbidity 1    Comorbidities surgery with complications, L TKA already present    Examination-Activity Limitations Stand;Stairs;Squat;Locomotion Level    Examination-Participation Restrictions Cleaning;Community Activity;Shop    Stability/Clinical Decision Making Evolving/Moderate complexity    Rehab Potential Good    PT Frequency 2x / week    PT Duration 6 weeks    PT Treatment/Interventions ADLs/Self Care Home Management;Cryotherapy;Electrical Stimulation;DME Instruction;Ultrasound;Moist Heat;Iontophoresis 4mg /ml Dexamethasone;Gait training;Stair training;Functional mobility training;Therapeutic activities;Therapeutic exercise;Balance training;Patient/family education;Neuromuscular re-education;Manual techniques;Passive range of motion;Dry needling;Taping;Vasopneumatic Device;Spinal Manipulations;Joint Manipulations    Consulted and Agree with Plan of Care Patient           Patient will benefit from skilled therapeutic intervention in order to improve the following deficits and impairments:  Abnormal gait,Decreased range of motion,Difficulty walking,Increased muscle spasms,Decreased activity  tolerance,Pain,Decreased balance,Impaired flexibility,Hypomobility,Decreased mobility,Decreased strength,Increased edema  Visit Diagnosis: Muscle weakness (generalized)  Pain in left leg  Stiffness of knee joint, left  Generalized weakness     Problem List Patient Active Problem List   Diagnosis Date Noted  . Iron deficiency anemia 10/01/2020  . Postural dizziness with presyncope 09/30/2020  . Hypertriglyceridemia 04/02/2020  . Aortic atherosclerosis ([place]) 02/14/2020  . Nontraumatic complete tear of left rotator cuff   . Diabetic neuropathy ([place]) 02/20/2019  . Severe persistent asthma 01/04/2019  . Subcortical microvascular ischemic occlusive disease 07/12/2018  . Rapid palpitations 07/12/2018  . Impingement of left ankle joint 06/26/2018  . Constipation 04/21/2018  . Total knee replacement status, left 12/07/2017  . Dysphagia 09/20/2017  . Epiglottitis   . Plantar fasciitis, right 07/13/2017  . S/P total knee arthroplasty, right 06/29/2017  . Osteoarthrosis, localized, primary, knee, right   . Sprain of calcaneofibular ligament of right ankle 06/06/2017  .  Osteoarthritis of right knee 01/26/2017  . Onychomycosis 10/27/2015  . CKD (chronic kidney disease) stage 3, GFR 30-59 ml/min (HCC) 04/27/2015  . MRSA (methicillin resistant staph aureus) culture positive 03/27/2015  . History of osteomyelitis 03/27/2015  . Fatty liver 09/30/2014  . Hot flashes 07/15/2014  . Abnormal SPEP 04/17/2014  . Fracture of left leg 04/17/2014  . Cushingoid side effect of steroids ([place]) 04/17/2014  . Internal hemorrhoids   . Pulmonary sarcoidosis ([place])   . Major depression in full remission ([place])   . Preoperative clearance 03/25/2014  . Obstructive chronic bronchitis without exacerbation ([place]) 09/18/2013  . Chest pain 04/11/2013  . Hypertensive heart disease with chronic diastolic congestive heart failure ([place])   . Solitary pulmonary nodule, on CT 02/2013 - stable over 2 years in 2015  02/20/2013  . Gout 08/20/2010  . Deficiency anemia 09/18/2009  . Morbid obesity ([place]) 06/04/2009  . Sleep apnea 04/21/2009  . Sarcoidosis of lung ([place]) 04/10/2007  . Hyperlipidemia associated with type 2 diabetes mellitus ([place]) 08/21/2006  . Hypertension associated with diabetes ([place]) 08/21/2006  . GERD 08/21/2006  . Type II diabetes mellitus with renal manifestations ([place]) 08/21/2006   Lyndee Hensen, PT, DPT 3:13 PM  11/19/20    [place], [name], [address] Phone: [phone]   Fax:  [phone]  Name: ANNMARGARET DECAPRIO MRN: [id number] Date of Birth: [birth year]

## 2020-11-24 ENCOUNTER — Encounter: Payer: 59 | Admitting: Physical Therapy

## 2020-11-24 ENCOUNTER — Other Ambulatory Visit: Payer: Self-pay

## 2020-11-24 ENCOUNTER — Ambulatory Visit (INDEPENDENT_AMBULATORY_CARE_PROVIDER_SITE_OTHER): Payer: 59 | Admitting: Physical Therapy

## 2020-11-24 ENCOUNTER — Encounter: Payer: Self-pay | Admitting: Physical Therapy

## 2020-11-24 DIAGNOSIS — M25662 Stiffness of left knee, not elsewhere classified: Secondary | ICD-10-CM | POA: Diagnosis not present

## 2020-11-24 DIAGNOSIS — M79605 Pain in left leg: Secondary | ICD-10-CM

## 2020-11-24 DIAGNOSIS — M6281 Muscle weakness (generalized): Secondary | ICD-10-CM

## 2020-11-24 NOTE — Therapy (Signed)
[facility] [address] [place], [name], [address] Phone: [phone]   Fax:  [phone]  Physical Therapy Treatment  Patient Details  Name: [name] MRN: [id number] Date of Birth: 1961/05/23 Referring Provider (PT): Garret Reddish   Encounter Date: 11/24/2020   PT End of Session - 11/24/20 1508    Visit Number 5    Number of Visits 12    Date for PT Re-Evaluation 12/15/20    Authorization Type U[employer]    PT Start Time 4742    PT Stop Time 1515    PT Time Calculation (min) 43 min    Activity Tolerance Patient tolerated treatment well    Behavior During Therapy [facility] for tasks assessed/performed           Past Medical History:  Diagnosis Date  . Abnormal SPEP 04/17/2014  . Acute left ankle pain 01/26/2017  . ANEMIA-UNSPECIFIED 09/18/2009  . Anxiety   . Arthritis   . Chronic diastolic heart failure, NYHA class 2 (HCC)    Normal LVEDP by May 2018  . COPD (chronic obstructive pulmonary disease) ([place])   . Depression   . DIABETES MELLITUS, TYPE II 08/21/2006  . Diabetic osteomyelitis ([place]) 05/29/2015  . Fatty liver   . Fracture of 5th metatarsal    non union  . GERD 59/56/3875   Had fundoplication  . GOUT 08/20/2010  . Hammer toe of right foot    2-5th toes  . Hx of umbilical hernia repair   . HYPERLIPIDEMIA 08/21/2006  . HYPERTENSION 08/21/2006  . Infection of wound due to methicillin resistant Staphylococcus aureus (MRSA)   . Internal hemorrhoids   . Kidney problem   . Multiple allergies 10/14/2016  . OBESITY 06/04/2009  . Onychomycosis 10/27/2015  . Osteomyelitis of left foot ([place]) 05/29/2015  . Pulmonary sarcoidosis ([place])    Followed locally by pulmonology, but also by Dr. Casper Harrison at [facility]  . Right knee pain 01/26/2017  . Vocal cord dysfunction   . Wears partial dentures     Past Surgical History:  Procedure Laterality Date  . ABDOMINAL HYSTERECTOMY    . APPENDECTOMY    . BLADDER SUSPENSION   11/11/2011   Procedure: TRANSVAGINAL TAPE (TVT) PROCEDURE;  Surgeon: Olga Millers, MD;  Location: [facility];  Service: Gynecology;  Laterality: N/A;  . CAROTIDS  02/18/11   CAROTID DUPLEX; VERTEBRALS ARE PATENT WITH ANTEGRADE FLOW. ICA/CCA RATIO 1.61 ON RIGHT AND 0.75 ON LEFT  . CHOLECYSTECTOMY  1984  . COLONOSCOPY  04/29/2010   [name]  . CYSTOSCOPY  11/11/2011   Procedure: CYSTOSCOPY;  Surgeon: Olga Millers, MD;  Location: [facility];  Service: Gynecology;  Laterality: N/A;  . EXTUBATION (ENDOTRACHEAL) IN OR N/A 09/23/2017   Procedure: EXTUBATION (ENDOTRACHEAL) IN OR;  Surgeon: Helayne Seminole, MD;  Location: [facility];  Service: ENT;  Laterality: N/A;  . FIBEROPTIC LARYNGOSCOPY AND TRACHEOSCOPY N/A 09/23/2017   Procedure: FLEXIBLE FIBEROPTIC LARYNGOSCOPY;  Surgeon: Helayne Seminole, MD;  Location: [facility];  Service: ENT;  Laterality: N/A;  . FRACTURE SURGERY     foot  . HALLUX FUSION Left 10/02/2018   Procedure: HALLUX ARTHRODESIS;  Surgeon: Edrick Kins, DPM;  Location: [facility];  Service: Podiatry;  Laterality: Left;  . HAMMER TOE SURGERY Left 10/02/2018   Procedure: HAMMER TOE CORRECTION 2ND 3RD 4RD FIFTH TOE;  Surgeon: Edrick Kins, DPM;  Location: [facility];  Service: Podiatry;  Laterality: Left;  . HAMMER TOE SURGERY Right 04/12/2019  Procedure: HAMMER TOE CORRECTION, 2ND, 3RD, 4TH AND 5TH TOES OF RIGHT FOOT;  Surgeon: Edrick Kins, DPM;  Location: [facility];  Service: Podiatry;  Laterality: Right;  . HAMMER TOE SURGERY Left 10/25/2019   Procedure: HAMMER TOE REPAIR SECOND, THIRD, FOUTH;  Surgeon: Edrick Kins, DPM;  Location: [facility];  Service: Podiatry;  Laterality: Left;  . HERNIA REPAIR    . I & D EXTREMITY Left 06/27/2015   Procedure: Partial Excision Left Calcaneus, Place Antibiotic Beads, and Wound VAC;  Surgeon: Newt Minion, MD;  Location: [facility];  Service: Orthopedics;  Laterality: Left;  . JOINT REPLACEMENT    . KNEE ARTHROSCOPY     right  . LEFT AND RIGHT HEART  CATHETERIZATION WITH CORONARY ANGIOGRAM N/A 04/23/2013   Procedure: LEFT AND RIGHT HEART CATHETERIZATION WITH CORONARY ANGIOGRAM;  Surgeon: Leonie Man, MD;  Location: [facility];  Service: Cardiovascular;  Laterality: N/A;  . LEFT HEART CATH AND CORONARY ANGIOGRAPHY N/A 03/11/2017   Procedure: Left Heart Cath and Coronary Angiography;  Surgeon: Sherren Mocha, MD;  Location: [facility]; angiographically minimal CAD in the LAD otherwise normal.;  Normal LVEDP.  FALSE POSITIVE MYOVIEW  . LEFT HEART CATH AND CORONARY ANGIOGRAPHY  07/20/2010   LVEF 50-55% WITH VERY MILD GLOBAL HYPOKINESIA; ESSENTIALLY NORMAL CORONARY ARTERIES; NORMAL LV FUNCTION  . METATARSAL OSTEOTOMY WITH OPEN REDUCTION INTERNAL FIXATION (ORIF) METATARSAL WITH FUSION Left 04/09/2014   Procedure: LEFT FOOT FRACTURE OPEN TREATMENT METATARSAL INCLUDES INTERNAL FIXATION EACH;  Surgeon: Lorn Junes, MD;  Location: [facility];  Service: Orthopedics;  Laterality: Left;  . NISSEN FUNDOPLICATION  2979  . NM MYOVIEW LTD --> FALSE POSITIVE  03/10/2017   : Moderate size "stress-induced "perfusion defect at the apex as well as "ill-defined stress-induced perfusion defect in the lateral wall.  EF 55%.  INTERMEDIATE risk. -->  FALSE POSITIVE  . ORIF DISTAL FEMUR FRACTURE  08/08/2020   [facility]: ORIF of left extra-articular periprosthetic distal femur fracture with synthesis of the femur plate with cortical nonlocking screws.  (Thought to be pathologic fracture due to osteoporosis) -> after a fall  . ORIF FEMUR W/ PERI-IMPLANT  08/29/2020   [facility]) revision of left femur fracture ORIF plate fixation screws; culture positive for MSSA  . Right and left CARDIAC CATHETERIZATION  04/23/2013   Angiographic normal coronaries; LVEDP 20 mmHg, PCWP 12-14 mmHg, RAP 12 mmHg.; Fick CO/CI 4.9/2.2  . SHOULDER ARTHROSCOPY Left 03/14/2019   Procedure: LEFT SHOULDER ARTHROSCOPY, DEBRIDEMENT, AND  DECOMPRESSION;  Surgeon: Newt Minion, MD;  Location: [facility];  Service: Orthopedics;  Laterality: Left;  . TOTAL KNEE ARTHROPLASTY Right 06/29/2017   Procedure: RIGHT TOTAL KNEE ARTHROPLASTY;  Surgeon: Newt Minion, MD;  Location: [facility];  Service: Orthopedics;  Laterality: Right;  . TOTAL KNEE ARTHROPLASTY Left 12/07/2017  . TOTAL KNEE ARTHROPLASTY Left 12/07/2017   Procedure: LEFT TOTAL KNEE ARTHROPLASTY;  Surgeon: Newt Minion, MD;  Location: [facility];  Service: Orthopedics;  Laterality: Left;  . TRACHEOSTOMY TUBE PLACEMENT N/A 09/20/2017   Procedure: AWAKE INTUBATION WITH ANESTHESIA WITH VIDEO ASSISTANCE;  Surgeon: Helayne Seminole, MD;  Location: [facility];  Service: ENT;  Laterality: N/A;  . TRANSTHORACIC ECHOCARDIOGRAM  08/2014   Normal LV size and function.  Mild LVH.  EF 55-60%.  Normal regional wall motion.  GR 1 DD.  Normal RV size and function .  [name] TRANSTHORACIC ECHOCARDIOGRAM  08/12/2020    [facility]  [facility]): Normal LV size and function.  EF 55 to 60%.  No RWM A.  Normal filling pattern.  Normal RV.  No significant valvular disease-stenosis or regurgitation.  No vegetation.  . TUBAL LIGATION     with reversal in 1994  . VENTRAL HERNIA REPAIR      There were no vitals filed for this visit.   Subjective Assessment - 11/24/20 1413    Subjective Pt states the knee feels better today. Pt states the MD took X-rays and the bone/hardware are in place. She reports that it was a "muscle stretching out or could be some scar tissue." Pt states the knee is not painful, just a little.    Pertinent History Bil TKA    Limitations Walking;Standing;House hold activities    Patient Stated Goals improved strength, use, gait    Pain Onset More than a month ago              [facility] PT Assessment - 11/24/20 1436      Ambulation/Gait   Gait Comments 35 ft x 4 with SPC and x 4 with No AD;   on hold due to status change                        [facility] Adult PT  Treatment/Exercise - 11/24/20 1438      Ambulation/Gait   Gait Comments 35 ft x 4 with SPC and x 4 with No AD;      Exercises   Exercises Knee/Hip      Knee/Hip Exercises: Aerobic   Recumbent Bike L 1 x 8 min   on hold due to status change     Knee/Hip Exercises: Standing   Hip Flexion 20 reps;Knee bent    Hip Abduction 10 reps;Both;2 sets    Forward Step Up 10 reps;Hand Hold: 1;Step Height: 6"    Functional Squat 2 sets;10 reps   towel under L LE, knee height chair   Stairs up/down 5 steps 1 HR, x 3;    SLS at stair, R foot on step, 70% WB on L 30s 3x      Knee/Hip Exercises: Seated   Sit to Sand --      Knee/Hip Exercises: Supine   Heel Slides --   on hold due to status change   Heel Slides Limitations --    Bridges 2 sets;10 reps    Straight Leg Raises 2 sets;10 reps;Both   on hold due to status change     Knee/Hip Exercises: Sidelying   Hip ABduction 10 reps;Both   on hold due to status change     Manual Therapy   Manual Therapy Soft tissue mobilization;Passive ROM    Soft tissue mobilization STM to lateral thigh, quad and ITB    Passive ROM for L knee flexion                  PT Education - 11/24/20 1506    Education Details HEP update, POC, thermotherapy, AD usage    Person(s) Educated Patient    Methods Explanation    Comprehension Verbalized understanding            PT Short Term Goals - 11/12/20 2107      PT SHORT TERM GOAL #1   Title Pt to be independent with initial HEP    Time 2    Period Weeks    Status New    Target Date 11/17/20  PT SHORT TERM GOAL #2   Title Pt to demo improved ROM of L knee by at 10 degrees    Time 4    Period Weeks    Status New    Target Date 11/17/20             PT Long Term Goals - 11/12/20 2108      PT LONG TERM GOAL #1   Title Pt to be independent with HEP    Time 6    Period Weeks    Status New    Target Date 12/15/20      PT LONG TERM GOAL #2   Title Pt to demo L knee ROM to be at  least 115 deg    Time 6    Period Weeks    Status New    Target Date 12/15/20      PT LONG TERM GOAL #3   Title Pt to demo L quad at hip strength to be at least 4+/5 to improve stability and gait    Time 6    Period Weeks    Status New    Target Date 12/15/20      PT LONG TERM GOAL #4   Title Pt to demo stability and [place] of L LE to be WNL with gait, dynamic balance and single leg stance    Time 6    Period Weeks    Status New    Target Date 12/15/20                 Plan - 11/24/20 1524    Clinical Impression Statement Pt able to meaningfully participate in therapy at today's session following MD clearance and imaging with negative findings of internal derangement. Pt was able to progress CKC squatting exercise to L LE emphasis due to offweighting. Pt had improved quality of gait after VC and demonstration for heel strike and toe off. Pt responded well to STM to VL and rec fem, report of decreased pain and improved muscle contraction following. Pt would benefit from continued skilled therapy in order to address functional mobility deficits and improve L LE strength for return to community mobility and PLOF.    Personal Factors and Comorbidities Fitness;Comorbidity 1    Comorbidities surgery with complications, L TKA already present    Examination-Activity Limitations Stand;Stairs;Squat;Locomotion Level    Examination-Participation Restrictions Cleaning;Community Activity;Shop    Stability/Clinical Decision Making Evolving/Moderate complexity    Rehab Potential Good    PT Frequency 2x / week    PT Duration 6 weeks    PT Treatment/Interventions ADLs/Self Care Home Management;Cryotherapy;Electrical Stimulation;DME Instruction;Ultrasound;Moist Heat;Iontophoresis 4mg /ml Dexamethasone;Gait training;Stair training;Functional mobility training;Therapeutic activities;Therapeutic exercise;Balance training;Patient/family education;Neuromuscular re-education;Manual techniques;Passive range  of motion;Dry needling;Taping;Vasopneumatic Device;Spinal Manipulations;Joint Manipulations    PT Next Visit Plan review squatting, bridge with band, review step up, TKE    Consulted and Agree with Plan of Care Patient           Patient will benefit from skilled therapeutic intervention in order to improve the following deficits and impairments:  Abnormal gait,Decreased range of motion,Difficulty walking,Increased muscle spasms,Decreased activity tolerance,Pain,Decreased balance,Impaired flexibility,Hypomobility,Decreased mobility,Decreased strength,Increased edema  Visit Diagnosis: Muscle weakness (generalized)  Pain in left leg  Stiffness of knee joint, left     Problem List Patient Active Problem List   Diagnosis Date Noted  . Iron deficiency anemia 10/01/2020  . Postural dizziness with presyncope 09/30/2020  . Hypertriglyceridemia 04/02/2020  . Aortic atherosclerosis ([place]) 02/14/2020  . Nontraumatic complete tear  of left rotator cuff   . Diabetic neuropathy ([place]) 02/20/2019  . Severe persistent asthma 01/04/2019  . Subcortical microvascular ischemic occlusive disease 07/12/2018  . Rapid palpitations 07/12/2018  . Impingement of left ankle joint 06/26/2018  . Constipation 04/21/2018  . Total knee replacement status, left 12/07/2017  . Dysphagia 09/20/2017  . Epiglottitis   . Plantar fasciitis, right 07/13/2017  . S/P total knee arthroplasty, right 06/29/2017  . Osteoarthrosis, localized, primary, knee, right   . Sprain of calcaneofibular ligament of right ankle 06/06/2017  . Osteoarthritis of right knee 01/26/2017  . Onychomycosis 10/27/2015  . CKD (chronic kidney disease) stage 3, GFR 30-59 ml/min (HCC) 04/27/2015  . MRSA (methicillin resistant staph aureus) culture positive 03/27/2015  . History of osteomyelitis 03/27/2015  . Fatty liver 09/30/2014  . Hot flashes 07/15/2014  . Abnormal SPEP 04/17/2014  . Fracture of left leg 04/17/2014  . Cushingoid side effect  of steroids ([place]) 04/17/2014  . Internal hemorrhoids   . Pulmonary sarcoidosis ([place])   . Major depression in full remission ([place])   . Preoperative clearance 03/25/2014  . Obstructive chronic bronchitis without exacerbation ([place]) 09/18/2013  . Chest pain 04/11/2013  . Hypertensive heart disease with chronic diastolic congestive heart failure ([place])   . Solitary pulmonary nodule, on CT 02/2013 - stable over 2 years in 2015 02/20/2013  . Gout 08/20/2010  . Deficiency anemia 09/18/2009  . Morbid obesity ([place]) 06/04/2009  . Sleep apnea 04/21/2009  . Sarcoidosis of lung ([place]) 04/10/2007  . Hyperlipidemia associated with type 2 diabetes mellitus ([place]) 08/21/2006  . Hypertension associated with diabetes ([place]) 08/21/2006  . GERD 08/21/2006  . Type II diabetes mellitus with renal manifestations ([place]) 08/21/2006   Daleen Bo PT, DPT 11/24/20 3:36 PM   [facility] [address] [place], [name], [address] Phone: [phone]   Fax:  [phone]  Name: [name] MRN: [id number] Date of Birth: 27-Jul-1961

## 2020-11-25 DIAGNOSIS — D860 Sarcoidosis of lung: Secondary | ICD-10-CM | POA: Diagnosis not present

## 2020-11-25 DIAGNOSIS — Z6841 Body Mass Index (BMI) 40.0 and over, adult: Secondary | ICD-10-CM | POA: Diagnosis not present

## 2020-11-26 ENCOUNTER — Ambulatory Visit (INDEPENDENT_AMBULATORY_CARE_PROVIDER_SITE_OTHER): Payer: 59 | Admitting: Physical Therapy

## 2020-11-26 ENCOUNTER — Encounter: Payer: Self-pay | Admitting: Physical Therapy

## 2020-11-26 ENCOUNTER — Encounter: Payer: 59 | Admitting: Physical Therapy

## 2020-11-26 ENCOUNTER — Other Ambulatory Visit: Payer: Self-pay

## 2020-11-26 DIAGNOSIS — M79605 Pain in left leg: Secondary | ICD-10-CM | POA: Diagnosis not present

## 2020-11-26 DIAGNOSIS — M6281 Muscle weakness (generalized): Secondary | ICD-10-CM

## 2020-11-26 DIAGNOSIS — M25662 Stiffness of left knee, not elsewhere classified: Secondary | ICD-10-CM

## 2020-11-26 NOTE — Therapy (Signed)
[facility] [address] [place], [name], [address] Phone: [phone]   Fax:  [phone]  Physical Therapy Treatment  Patient Details  Name: [name] MRN: [id number] Date of Birth: November 28, 1960 Referring Provider (PT): Garret Reddish   Encounter Date: 11/26/2020   PT End of Session - 11/26/20 1434    Visit Number 6    Number of Visits 12    Date for PT Re-Evaluation 12/15/20    Authorization Type U[employer]    PT Start Time 1660    PT Stop Time 1515    PT Time Calculation (min) 41 min    Activity Tolerance Patient tolerated treatment well    Behavior During Therapy [facility] for tasks assessed/performed           Past Medical History:  Diagnosis Date  . Abnormal SPEP 04/17/2014  . Acute left ankle pain 01/26/2017  . ANEMIA-UNSPECIFIED 09/18/2009  . Anxiety   . Arthritis   . Chronic diastolic heart failure, NYHA class 2 (HCC)    Normal LVEDP by May 2018  . COPD (chronic obstructive pulmonary disease) ([place])   . Depression   . DIABETES MELLITUS, TYPE II 08/21/2006  . Diabetic osteomyelitis ([place]) 05/29/2015  . Fatty liver   . Fracture of 5th metatarsal    non union  . GERD 63/10/6008   Had fundoplication  . GOUT 08/20/2010  . Hammer toe of right foot    2-5th toes  . Hx of umbilical hernia repair   . HYPERLIPIDEMIA 08/21/2006  . HYPERTENSION 08/21/2006  . Infection of wound due to methicillin resistant Staphylococcus aureus (MRSA)   . Internal hemorrhoids   . Kidney problem   . Multiple allergies 10/14/2016  . OBESITY 06/04/2009  . Onychomycosis 10/27/2015  . Osteomyelitis of left foot ([place]) 05/29/2015  . Pulmonary sarcoidosis ([place])    Followed locally by pulmonology, but also by Dr. Casper Harrison at [facility]  . Right knee pain 01/26/2017  . Vocal cord dysfunction   . Wears partial dentures     Past Surgical History:  Procedure Laterality Date  . ABDOMINAL HYSTERECTOMY    . APPENDECTOMY    . BLADDER SUSPENSION   11/11/2011   Procedure: TRANSVAGINAL TAPE (TVT) PROCEDURE;  Surgeon: Olga Millers, MD;  Location: [facility];  Service: Gynecology;  Laterality: N/A;  . CAROTIDS  02/18/11   CAROTID DUPLEX; VERTEBRALS ARE PATENT WITH ANTEGRADE FLOW. ICA/CCA RATIO 1.61 ON RIGHT AND 0.75 ON LEFT  . CHOLECYSTECTOMY  1984  . COLONOSCOPY  04/29/2010   [name]  . CYSTOSCOPY  11/11/2011   Procedure: CYSTOSCOPY;  Surgeon: Olga Millers, MD;  Location: [facility];  Service: Gynecology;  Laterality: N/A;  . EXTUBATION (ENDOTRACHEAL) IN OR N/A 09/23/2017   Procedure: EXTUBATION (ENDOTRACHEAL) IN OR;  Surgeon: Helayne Seminole, MD;  Location: [facility];  Service: ENT;  Laterality: N/A;  . FIBEROPTIC LARYNGOSCOPY AND TRACHEOSCOPY N/A 09/23/2017   Procedure: FLEXIBLE FIBEROPTIC LARYNGOSCOPY;  Surgeon: Helayne Seminole, MD;  Location: [facility];  Service: ENT;  Laterality: N/A;  . FRACTURE SURGERY     foot  . HALLUX FUSION Left 10/02/2018   Procedure: HALLUX ARTHRODESIS;  Surgeon: Edrick Kins, DPM;  Location: [facility];  Service: Podiatry;  Laterality: Left;  . HAMMER TOE SURGERY Left 10/02/2018   Procedure: HAMMER TOE CORRECTION 2ND 3RD 4RD FIFTH TOE;  Surgeon: Edrick Kins, DPM;  Location: [facility];  Service: Podiatry;  Laterality: Left;  . HAMMER TOE SURGERY Right 04/12/2019  Procedure: HAMMER TOE CORRECTION, 2ND, 3RD, 4TH AND 5TH TOES OF RIGHT FOOT;  Surgeon: Edrick Kins, DPM;  Location: [facility];  Service: Podiatry;  Laterality: Right;  . HAMMER TOE SURGERY Left 10/25/2019   Procedure: HAMMER TOE REPAIR SECOND, THIRD, FOUTH;  Surgeon: Edrick Kins, DPM;  Location: [facility];  Service: Podiatry;  Laterality: Left;  . HERNIA REPAIR    . I & D EXTREMITY Left 06/27/2015   Procedure: Partial Excision Left Calcaneus, Place Antibiotic Beads, and Wound VAC;  Surgeon: Newt Minion, MD;  Location: [facility];  Service: Orthopedics;  Laterality: Left;  . JOINT REPLACEMENT    . KNEE ARTHROSCOPY     right  . LEFT AND RIGHT HEART  CATHETERIZATION WITH CORONARY ANGIOGRAM N/A 04/23/2013   Procedure: LEFT AND RIGHT HEART CATHETERIZATION WITH CORONARY ANGIOGRAM;  Surgeon: Leonie Man, MD;  Location: [facility];  Service: Cardiovascular;  Laterality: N/A;  . LEFT HEART CATH AND CORONARY ANGIOGRAPHY N/A 03/11/2017   Procedure: Left Heart Cath and Coronary Angiography;  Surgeon: Sherren Mocha, MD;  Location: [facility]; angiographically minimal CAD in the LAD otherwise normal.;  Normal LVEDP.  FALSE POSITIVE MYOVIEW  . LEFT HEART CATH AND CORONARY ANGIOGRAPHY  07/20/2010   LVEF 50-55% WITH VERY MILD GLOBAL HYPOKINESIA; ESSENTIALLY NORMAL CORONARY ARTERIES; NORMAL LV FUNCTION  . METATARSAL OSTEOTOMY WITH OPEN REDUCTION INTERNAL FIXATION (ORIF) METATARSAL WITH FUSION Left 04/09/2014   Procedure: LEFT FOOT FRACTURE OPEN TREATMENT METATARSAL INCLUDES INTERNAL FIXATION EACH;  Surgeon: Lorn Junes, MD;  Location: [facility];  Service: Orthopedics;  Laterality: Left;  . NISSEN FUNDOPLICATION  2979  . NM MYOVIEW LTD --> FALSE POSITIVE  03/10/2017   : Moderate size "stress-induced "perfusion defect at the apex as well as "ill-defined stress-induced perfusion defect in the lateral wall.  EF 55%.  INTERMEDIATE risk. -->  FALSE POSITIVE  . ORIF DISTAL FEMUR FRACTURE  08/08/2020   [facility]: ORIF of left extra-articular periprosthetic distal femur fracture with synthesis of the femur plate with cortical nonlocking screws.  (Thought to be pathologic fracture due to osteoporosis) -> after a fall  . ORIF FEMUR W/ PERI-IMPLANT  08/29/2020   [facility]) revision of left femur fracture ORIF plate fixation screws; culture positive for MSSA  . Right and left CARDIAC CATHETERIZATION  04/23/2013   Angiographic normal coronaries; LVEDP 20 mmHg, PCWP 12-14 mmHg, RAP 12 mmHg.; Fick CO/CI 4.9/2.2  . SHOULDER ARTHROSCOPY Left 03/14/2019   Procedure: LEFT SHOULDER ARTHROSCOPY, DEBRIDEMENT, AND  DECOMPRESSION;  Surgeon: Newt Minion, MD;  Location: [facility];  Service: Orthopedics;  Laterality: Left;  . TOTAL KNEE ARTHROPLASTY Right 06/29/2017   Procedure: RIGHT TOTAL KNEE ARTHROPLASTY;  Surgeon: Newt Minion, MD;  Location: [facility];  Service: Orthopedics;  Laterality: Right;  . TOTAL KNEE ARTHROPLASTY Left 12/07/2017  . TOTAL KNEE ARTHROPLASTY Left 12/07/2017   Procedure: LEFT TOTAL KNEE ARTHROPLASTY;  Surgeon: Newt Minion, MD;  Location: [facility];  Service: Orthopedics;  Laterality: Left;  . TRACHEOSTOMY TUBE PLACEMENT N/A 09/20/2017   Procedure: AWAKE INTUBATION WITH ANESTHESIA WITH VIDEO ASSISTANCE;  Surgeon: Helayne Seminole, MD;  Location: [facility];  Service: ENT;  Laterality: N/A;  . TRANSTHORACIC ECHOCARDIOGRAM  08/2014   Normal LV size and function.  Mild LVH.  EF 55-60%.  Normal regional wall motion.  GR 1 DD.  Normal RV size and function .  [name] TRANSTHORACIC ECHOCARDIOGRAM  08/12/2020    [facility]): Normal LV size and function.  EF 55 to 60%.  No RWM A.  Normal filling pattern.  Normal RV.  No significant valvular disease-stenosis or regurgitation.  No vegetation.  . TUBAL LIGATION     with reversal in 1994  . VENTRAL HERNIA REPAIR      There were no vitals filed for this visit.   Subjective Assessment - 11/26/20 1432    Subjective Pt states that her knee continues to feel better today. She states she was not too sore after last session and has been doing well the last few days.    Pertinent History Bil TKA    Limitations Walking;Standing;House hold activities    Patient Stated Goals improved strength, use, gait    Currently in Pain? Yes    Pain Score 3     Pain Location Knee    Pain Orientation Left    Pain Descriptors / Indicators Sharp;Aching    Pain Type Chronic pain    Pain Onset More than a month ago                             [facility] Adult PT Treatment/Exercise - 11/26/20 1436      Ambulation/Gait   Gait Comments --       Exercises   Exercises Knee/Hip      Knee/Hip Exercises: Aerobic   Recumbent Bike L 1 x 8 min   on hold due to status change     Knee/Hip Exercises: Standing   Hip Flexion 20 reps;Knee bent    Hip Abduction 10 reps;Both;2 sets    Forward Step Up 10 reps;Hand Hold: 1;Step Height: 6"    Functional Squat 2 sets;10 reps   towel under L LE, knee height chair   SLS at stair, R foot on step, 70% WB on L 30s 3x      Knee/Hip Exercises: Supine   Bridges --    Bridges Limitations --    Bridges with Clamshell 2 sets;10 reps   Green TB   Straight Leg Raises 2 sets;10 reps;Both   on hold due to status change     Knee/Hip Exercises: Sidelying   Hip ABduction 10 reps;Both   on hold due to status change     Manual Therapy   Manual Therapy Soft tissue mobilization;Passive ROM    Soft tissue mobilization STM to lateral thigh, quad and ITB    Passive ROM for L knee flexion                  PT Education - 11/26/20 1434    Education Details HEP review, thermotherapy, AD usage, increased daily walking    Person(s) Educated Patient    Methods Explanation    Comprehension Verbalized understanding            PT Short Term Goals - 11/12/20 2107      PT SHORT TERM GOAL #1   Title Pt to be independent with initial HEP    Time 2    Period Weeks    Status New    Target Date 11/17/20      PT SHORT TERM GOAL #2   Title Pt to demo improved ROM of L knee by at 10 degrees    Time 4    Period Weeks    Status New    Target Date 11/17/20  PT Long Term Goals - 11/12/20 2108      PT LONG TERM GOAL #1   Title Pt to be independent with HEP    Time 6    Period Weeks    Status New    Target Date 12/15/20      PT LONG TERM GOAL #2   Title Pt to demo L knee ROM to be at least 115 deg    Time 6    Period Weeks    Status New    Target Date 12/15/20      PT LONG TERM GOAL #3   Title Pt to demo L quad at hip strength to be at least 4+/5 to improve stability and gait     Time 6    Period Weeks    Status New    Target Date 12/15/20      PT LONG TERM GOAL #4   Title Pt to demo stability and [place] of L LE to be WNL with gait, dynamic balance and single leg stance    Time 6    Period Weeks    Status New    Target Date 12/15/20                 Plan - 11/26/20 1435    Clinical Impression Statement Pt was able to perform squatting exercise with continued emphasis on L LE weightbearing. Pt was able to self correct weight shift and no loner required the use of an additional block. Pt responded well to STM to VL and rec fem, report of decreased pain and improved muscle contraction following. Pt does present with slight L knee extensor lag with SLR likely due to fatigue. Pt would benefit from continued skilled therapy in order to address functional mobility deficits and improve L LE strength for return to community mobility and PLOF.    Personal Factors and Comorbidities Fitness;Comorbidity 1    Comorbidities surgery with complications, L TKA already present    Examination-Activity Limitations Stand;Stairs;Squat;Locomotion Level    Examination-Participation Restrictions Cleaning;Community Activity;Shop    Stability/Clinical Decision Making Evolving/Moderate complexity    Rehab Potential Good    PT Frequency 2x / week    PT Duration 6 weeks    PT Treatment/Interventions ADLs/Self Care Home Management;Cryotherapy;Electrical Stimulation;DME Instruction;Ultrasound;Moist Heat;Iontophoresis 4mg /ml Dexamethasone;Gait training;Stair training;Functional mobility training;Therapeutic activities;Therapeutic exercise;Balance training;Patient/family education;Neuromuscular re-education;Manual techniques;Passive range of motion;Dry needling;Taping;Vasopneumatic Device;Spinal Manipulations;Joint Manipulations    PT Next Visit Plan review squatting, bridge with band, review step up, TKE    Consulted and Agree with Plan of Care Patient           Patient will benefit from  skilled therapeutic intervention in order to improve the following deficits and impairments:  Abnormal gait,Decreased range of motion,Difficulty walking,Increased muscle spasms,Decreased activity tolerance,Pain,Decreased balance,Impaired flexibility,Hypomobility,Decreased mobility,Decreased strength,Increased edema  Visit Diagnosis: Muscle weakness (generalized)  Pain in left leg  Stiffness of knee joint, left     Problem List Patient Active Problem List   Diagnosis Date Noted  . Iron deficiency anemia 10/01/2020  . Postural dizziness with presyncope 09/30/2020  . Hypertriglyceridemia 04/02/2020  . Aortic atherosclerosis ([place]) 02/14/2020  . Nontraumatic complete tear of left rotator cuff   . Diabetic neuropathy ([place]) 02/20/2019  . Severe persistent asthma 01/04/2019  . Subcortical microvascular ischemic occlusive disease 07/12/2018  . Rapid palpitations 07/12/2018  . Impingement of left ankle joint 06/26/2018  . Constipation 04/21/2018  . Total knee replacement status, left 12/07/2017  . Dysphagia 09/20/2017  . Epiglottitis   .  Plantar fasciitis, right 07/13/2017  . S/P total knee arthroplasty, right 06/29/2017  . Osteoarthrosis, localized, primary, knee, right   . Sprain of calcaneofibular ligament of right ankle 06/06/2017  . Osteoarthritis of right knee 01/26/2017  . Onychomycosis 10/27/2015  . CKD (chronic kidney disease) stage 3, GFR 30-59 ml/min (HCC) 04/27/2015  . MRSA (methicillin resistant staph aureus) culture positive 03/27/2015  . History of osteomyelitis 03/27/2015  . Fatty liver 09/30/2014  . Hot flashes 07/15/2014  . Abnormal SPEP 04/17/2014  . Fracture of left leg 04/17/2014  . Cushingoid side effect of steroids ([place]) 04/17/2014  . Internal hemorrhoids   . Pulmonary sarcoidosis ([place])   . Major depression in full remission ([place])   . Preoperative clearance 03/25/2014  . Obstructive chronic bronchitis without exacerbation ([place]) 09/18/2013  . Chest pain  04/11/2013  . Hypertensive heart disease with chronic diastolic congestive heart failure ([place])   . Solitary pulmonary nodule, on CT 02/2013 - stable over 2 years in 2015 02/20/2013  . Gout 08/20/2010  . Deficiency anemia 09/18/2009  . Morbid obesity ([place]) 06/04/2009  . Sleep apnea 04/21/2009  . Sarcoidosis of lung ([place]) 04/10/2007  . Hyperlipidemia associated with type 2 diabetes mellitus ([place]) 08/21/2006  . Hypertension associated with diabetes ([place]) 08/21/2006  . GERD 08/21/2006  . Type II diabetes mellitus with renal manifestations ([place]) 08/21/2006    Daleen Bo PT, DPT 11/26/20 3:30 PM   [facility] [address] [place], [name], [address] Phone: [phone]   Fax:  [phone]  Name: [name] MRN: [id number] Date of Birth: 1961/02/08

## 2020-11-27 ENCOUNTER — Ambulatory Visit: Payer: 59 | Admitting: Family Medicine

## 2020-11-28 ENCOUNTER — Ambulatory Visit: Payer: 59 | Admitting: Endocrinology

## 2020-12-01 ENCOUNTER — Ambulatory Visit (INDEPENDENT_AMBULATORY_CARE_PROVIDER_SITE_OTHER): Payer: 59 | Admitting: Physical Therapy

## 2020-12-01 ENCOUNTER — Encounter: Payer: Self-pay | Admitting: Physical Therapy

## 2020-12-01 ENCOUNTER — Other Ambulatory Visit: Payer: Self-pay

## 2020-12-01 DIAGNOSIS — R531 Weakness: Secondary | ICD-10-CM | POA: Diagnosis not present

## 2020-12-01 DIAGNOSIS — M25662 Stiffness of left knee, not elsewhere classified: Secondary | ICD-10-CM | POA: Diagnosis not present

## 2020-12-01 DIAGNOSIS — M79605 Pain in left leg: Secondary | ICD-10-CM | POA: Diagnosis not present

## 2020-12-01 DIAGNOSIS — M6281 Muscle weakness (generalized): Secondary | ICD-10-CM | POA: Diagnosis not present

## 2020-12-01 NOTE — Therapy (Signed)
[facility] [address] [place], [place], [address] Phone: [phone]   Fax:  [phone]  Physical Therapy Treatment  Patient Details  Name: [name] MRN: [id number] Date of Birth: 1960-10-18 Referring Provider (PT): Tana Conch   Encounter Date: 12/01/2020   PT End of Session - 12/01/20 1523    Visit Number 7    Number of Visits 12    Date for PT Re-Evaluation 12/15/20    Authorization Type UHC    PT Start Time 1437    PT Stop Time 1515    PT Time Calculation (min) 38 min    Activity Tolerance Patient tolerated treatment well    Behavior During Therapy [facility] for tasks assessed/performed           Past Medical History:  Diagnosis Date  . Abnormal SPEP 04/17/2014  . Acute left ankle pain 01/26/2017  . ANEMIA-UNSPECIFIED 09/18/2009  . Anxiety   . Arthritis   . Chronic diastolic heart failure, NYHA class 2 (HCC)    Normal LVEDP by May 2018  . COPD (chronic obstructive pulmonary disease) (HCC)   . Depression   . DIABETES MELLITUS, TYPE II 08/21/2006  . Diabetic osteomyelitis (HCC) 05/29/2015  . Fatty liver   . Fracture of 5th metatarsal    non union  . GERD 08/21/2006   Had fundoplication  . GOUT 08/20/2010  . Hammer toe of right foot    2-5th toes  . Hx of umbilical hernia repair   . HYPERLIPIDEMIA 08/21/2006  . HYPERTENSION 08/21/2006  . Infection of wound due to methicillin resistant Staphylococcus aureus (MRSA)   . Internal hemorrhoids   . Kidney problem   . Multiple allergies 10/14/2016  . OBESITY 06/04/2009  . Onychomycosis 10/27/2015  . Osteomyelitis of left foot (HCC) 05/29/2015  . Pulmonary sarcoidosis (HCC)    Followed locally by pulmonology, but also by Dr. Sandy Salaam at [facility]  . Right knee pain 01/26/2017  . Vocal cord dysfunction   . Wears partial dentures     Past Surgical History:  Procedure Laterality Date  . ABDOMINAL HYSTERECTOMY    . APPENDECTOMY    . BLADDER SUSPENSION   11/11/2011   Procedure: TRANSVAGINAL TAPE (TVT) PROCEDURE;  Surgeon: Levi Aland, MD;  Location: [facility];  Service: Gynecology;  Laterality: N/A;  . CAROTIDS  02/18/11   CAROTID DUPLEX; VERTEBRALS ARE PATENT WITH ANTEGRADE FLOW. ICA/CCA RATIO 1.61 ON RIGHT AND 0.75 ON LEFT  . CHOLECYSTECTOMY  1984  . COLONOSCOPY  04/29/2010   Marina Goodell  . CYSTOSCOPY  11/11/2011   Procedure: CYSTOSCOPY;  Surgeon: Levi Aland, MD;  Location: [facility];  Service: Gynecology;  Laterality: N/A;  . EXTUBATION (ENDOTRACHEAL) IN OR N/A 09/23/2017   Procedure: EXTUBATION (ENDOTRACHEAL) IN OR;  Surgeon: Graylin Shiver, MD;  Location: [facility];  Service: ENT;  Laterality: N/A;  . FIBEROPTIC LARYNGOSCOPY AND TRACHEOSCOPY N/A 09/23/2017   Procedure: FLEXIBLE FIBEROPTIC LARYNGOSCOPY;  Surgeon: Graylin Shiver, MD;  Location: [facility];  Service: ENT;  Laterality: N/A;  . FRACTURE SURGERY     foot  . HALLUX FUSION Left 10/02/2018   Procedure: HALLUX ARTHRODESIS;  Surgeon: Felecia Shelling, DPM;  Location: [facility];  Service: Podiatry;  Laterality: Left;  . HAMMER TOE SURGERY Left 10/02/2018   Procedure: HAMMER TOE CORRECTION 2ND 3RD 4RD FIFTH TOE;  Surgeon: Felecia Shelling, DPM;  Location: [facility];  Service: Podiatry;  Laterality: Left;  . HAMMER TOE SURGERY Right 04/12/2019  Procedure: HAMMER TOE CORRECTION, 2ND, 3RD, 4TH AND 5TH TOES OF RIGHT FOOT;  Surgeon: Edrick Kins, DPM;  Location: [place];  Service: Podiatry;  Laterality: Right;  . HAMMER TOE SURGERY Left 10/25/2019   Procedure: HAMMER TOE REPAIR SECOND, THIRD, FOUTH;  Surgeon: Edrick Kins, DPM;  Location: [place] [facility];  Service: Podiatry;  Laterality: Left;  . HERNIA REPAIR    . I & D EXTREMITY Left 06/27/2015   Procedure: Partial Excision Left Calcaneus, Place Antibiotic Beads, and Wound VAC;  Surgeon: Newt Minion, MD;  Location: [place];  Service: Orthopedics;  Laterality: Left;  . JOINT REPLACEMENT    . KNEE ARTHROSCOPY     right  . LEFT AND RIGHT HEART  CATHETERIZATION WITH CORONARY ANGIOGRAM N/A 04/23/2013   Procedure: LEFT AND RIGHT HEART CATHETERIZATION WITH CORONARY ANGIOGRAM;  Surgeon: Leonie Man, MD;  Location: [facility] C[facility];  Service: Cardiovascular;  Laterality: N/A;  . LEFT HEART CATH AND CORONARY ANGIOGRAPHY N/A 03/11/2017   Procedure: Left Heart Cath and Coronary Angiography;  Surgeon: Sherren Mocha, MD;  Location: [place] CV LAB; angiographically minimal CAD in the LAD otherwise normal.;  Normal LVEDP.  FALSE POSITIVE MYOVIEW  . LEFT HEART CATH AND CORONARY ANGIOGRAPHY  07/20/2010   LVEF 50-55% WITH VERY MILD GLOBAL HYPOKINESIA; ESSENTIALLY NORMAL CORONARY ARTERIES; NORMAL LV FUNCTION  . METATARSAL OSTEOTOMY WITH OPEN REDUCTION INTERNAL FIXATION (ORIF) METATARSAL WITH FUSION Left 04/09/2014   Procedure: LEFT FOOT FRACTURE OPEN TREATMENT METATARSAL INCLUDES INTERNAL FIXATION EACH;  Surgeon: Lorn Junes, MD;  Location: [place];  Service: Orthopedics;  Laterality: Left;  . NISSEN FUNDOPLICATION  2979  . NM MYOVIEW LTD --> FALSE POSITIVE  03/10/2017   : Moderate size "stress-induced "perfusion defect at the apex as well as "ill-defined stress-induced perfusion defect in the lateral wall.  EF 55%.  INTERMEDIATE risk. -->  FALSE POSITIVE  . ORIF DISTAL FEMUR FRACTURE  08/08/2020   [facility]: ORIF of left extra-articular periprosthetic distal femur fracture with synthesis of the femur plate with cortical nonlocking screws.  (Thought to be pathologic fracture due to osteoporosis) -> after a fall  . ORIF FEMUR W/ PERI-IMPLANT  08/29/2020   [facility]) revision of left femur fracture ORIF plate fixation screws; culture positive for MSSA  . Right and left CARDIAC CATHETERIZATION  04/23/2013   Angiographic normal coronaries; LVEDP 20 mmHg, PCWP 12-14 mmHg, RAP 12 mmHg.; Fick CO/CI 4.9/2.2  . SHOULDER ARTHROSCOPY Left 03/14/2019   Procedure: LEFT SHOULDER ARTHROSCOPY, DEBRIDEMENT, AND  DECOMPRESSION;  Surgeon: Newt Minion, MD;  Location: [place];  Service: Orthopedics;  Laterality: Left;  . TOTAL KNEE ARTHROPLASTY Right 06/29/2017   Procedure: RIGHT TOTAL KNEE ARTHROPLASTY;  Surgeon: Newt Minion, MD;  Location: [place];  Service: Orthopedics;  Laterality: Right;  . TOTAL KNEE ARTHROPLASTY Left 12/07/2017  . TOTAL KNEE ARTHROPLASTY Left 12/07/2017   Procedure: LEFT TOTAL KNEE ARTHROPLASTY;  Surgeon: Newt Minion, MD;  Location: [place];  Service: Orthopedics;  Laterality: Left;  . TRACHEOSTOMY TUBE PLACEMENT N/A 09/20/2017   Procedure: AWAKE INTUBATION WITH ANESTHESIA WITH VIDEO ASSISTANCE;  Surgeon: Helayne Seminole, MD;  Location: [place] OR;  Service: ENT;  Laterality: N/A;  . TRANSTHORACIC ECHOCARDIOGRAM  08/2014   Normal LV size and function.  Mild LVH.  EF 55-60%.  Normal regional wall motion.  GR 1 DD.  Normal RV size and function .  [name] TRANSTHORACIC ECHOCARDIOGRAM  08/12/2020    [facility]  [facility]): Normal LV size and function.  EF 55 to 60%.  No RWM A.  Normal filling pattern.  Normal RV.  No significant valvular disease-stenosis or regurgitation.  No vegetation.  . TUBAL LIGATION     with reversal in 1994  . VENTRAL HERNIA REPAIR      There were no vitals filed for this visit.   Subjective Assessment - 12/01/20 1522    Subjective Pt states doing well , minimal pain. Still using cane for outside ambulation.    Currently in Pain? No/denies    Pain Score 0-No pain              [facility] PT Assessment - 12/01/20 0001      AROM   Left Knee Flexion 110      Strength   Overall Strength Comments L knee flex: 4/5,  Ext: 4-/5                         [facility] Adult PT Treatment/Exercise - 12/01/20 0001      Exercises   Exercises Knee/Hip      Knee/Hip Exercises: Aerobic   Recumbent Bike L 1 x 7 min      Knee/Hip Exercises: Standing   Hip Flexion 20 reps;Knee bent    Hip Abduction 10 reps;Both;2 sets    Forward Step Up 10 reps;Hand Hold:  1;Step Height: 6"    Functional Squat 2 sets;10 reps   at mat table   Other Standing Knee Exercises walk/march 10 ft x 4;    Other Standing Knee Exercises L/R and A/P weight shifts on airex x 20 for stability,  staggered stance weight shifts x10 bil;      Knee/Hip Exercises: Supine   Bridges with Clamshell 2 sets;10 reps    Straight Leg Raises 2 sets;10 reps;Both      Knee/Hip Exercises: Prone   Hamstring Curl 10 reps      Manual Therapy   Manual Therapy Soft tissue mobilization;Passive ROM    Passive ROM for L knee flexion                  PT Education - 12/01/20 1523    Education Details Reviewed HEP    Person(s) Educated Patient    Methods Explanation;Demonstration;Verbal cues    Comprehension Verbalized understanding;Returned demonstration;Verbal cues required            PT Short Term Goals - 12/01/20 1524      PT SHORT TERM GOAL #1   Title Pt to be independent with initial HEP    Time 2    Period Weeks    Status Achieved    Target Date 11/17/20      PT SHORT TERM GOAL #2   Title Pt to demo improved ROM of L knee by at 10 degrees    Time 4    Period Weeks    Status Partially Met    Target Date 11/17/20             PT Long Term Goals - 12/01/20 1524      PT LONG TERM GOAL #1   Title Pt to be independent with HEP    Time 6    Period Weeks    Status Partially Met    Target Date 12/15/20      PT LONG TERM GOAL #2   Title Pt to demo L knee ROM to be at least 115 deg  Time 6    Period Weeks    Status On-going    Target Date 12/15/20      PT LONG TERM GOAL #3   Title Pt to demo L quad at hip strength to be at least 4+/5 to improve stability and gait    Time 6    Period Weeks    Status On-going    Target Date 12/15/20      PT LONG TERM GOAL #4   Title Pt to demo stability and [place] of L LE to be WNL with gait, dynamic balance and single leg stance    Time 6    Period Weeks    Status On-going    Target Date 12/15/20                  Plan - 12/01/20 1525    Clinical Impression Statement Pt progressing well, continued focus on strengthening and stability for L LE, quad and hip. Noted weakness and instability with ther ex, and will continue to benefit from work on this. Pt with improving ability for SLS phase of gait , marching, and stairs. Noted quad weakness with SLR. Pt to benefit from continued care for strength, stability, and safe functional activity.    Personal Factors and Comorbidities Fitness;Comorbidity 1    Comorbidities surgery with complications, L TKA already present    Examination-Activity Limitations Stand;Stairs;Squat;Locomotion Level    Examination-Participation Restrictions Cleaning;Community Activity;Shop    Stability/Clinical Decision Making Evolving/Moderate complexity    Rehab Potential Good    PT Frequency 2x / week    PT Duration 6 weeks    PT Treatment/Interventions ADLs/Self Care Home Management;Cryotherapy;Electrical Stimulation;DME Instruction;Ultrasound;Moist Heat;Iontophoresis 40m/ml Dexamethasone;Gait training;Stair training;Functional mobility training;Therapeutic activities;Therapeutic exercise;Balance training;Patient/family education;Neuromuscular re-education;Manual techniques;Passive range of motion;Dry needling;Taping;Vasopneumatic Device;Spinal Manipulations;Joint Manipulations    PT Next Visit Plan review squatting, bridge with band, review step up, TKE    Consulted and Agree with Plan of Care Patient           Patient will benefit from skilled therapeutic intervention in order to improve the following deficits and impairments:  Abnormal gait,Decreased range of motion,Difficulty walking,Increased muscle spasms,Decreased activity tolerance,Pain,Decreased balance,Impaired flexibility,Hypomobility,Decreased mobility,Decreased strength,Increased edema  Visit Diagnosis: Muscle weakness (generalized)  Pain in left leg  Stiffness of knee joint, left  Generalized  weakness     Problem List Patient Active Problem List   Diagnosis Date Noted  . Iron deficiency anemia 10/01/2020  . Postural dizziness with presyncope 09/30/2020  . Hypertriglyceridemia 04/02/2020  . Aortic atherosclerosis ([place] 02/14/2020  . Nontraumatic complete tear of left rotator cuff   . Diabetic neuropathy ([place] 02/20/2019  . Severe persistent asthma 01/04/2019  . Subcortical microvascular ischemic occlusive disease 07/12/2018  . Rapid palpitations 07/12/2018  . Impingement of left ankle joint 06/26/2018  . Constipation 04/21/2018  . Total knee replacement status, left 12/07/2017  . Dysphagia 09/20/2017  . Epiglottitis   . Plantar fasciitis, right 07/13/2017  . S/P total knee arthroplasty, right 06/29/2017  . Osteoarthrosis, localized, primary, knee, right   . Sprain of calcaneofibular ligament of right ankle 06/06/2017  . Osteoarthritis of right knee 01/26/2017  . Onychomycosis 10/27/2015  . CKD (chronic kidney disease) stage 3, GFR 30-59 ml/min (HCC) 04/27/2015  . MRSA (methicillin resistant staph aureus) culture positive 03/27/2015  . History of osteomyelitis 03/27/2015  . Fatty liver 09/30/2014  . Hot flashes 07/15/2014  . Abnormal SPEP 04/17/2014  . Fracture of left leg 04/17/2014  . Cushingoid side effect of steroids ([place]  04/17/2014  . Internal hemorrhoids   . Pulmonary sarcoidosis ([place])   . Major depression in full remission ([place])   . Preoperative clearance 03/25/2014  . Obstructive chronic bronchitis without exacerbation ([place]) 09/18/2013  . Chest pain 04/11/2013  . Hypertensive heart disease with chronic diastolic congestive heart failure ([place])   . Solitary pulmonary nodule, on CT 02/2013 - stable over 2 years in 2015 02/20/2013  . Gout 08/20/2010  . Deficiency anemia 09/18/2009  . Morbid obesity ([place]) 06/04/2009  . Sleep apnea 04/21/2009  . Sarcoidosis of lung ([place]) 04/10/2007  . Hyperlipidemia associated with type 2 diabetes mellitus ([place]) 08/21/2006   . Hypertension associated with diabetes ([place]) 08/21/2006  . GERD 08/21/2006  . Type II diabetes mellitus with renal manifestations ([place]) 08/21/2006    Lyndee Hensen 12/01/2020, 3:27 PM  [facility] [address] [place], [name], [address] Phone: [phone]   Fax:  [phone]  Name: NORVELL URESTE MRN: [id number] Date of Birth: 01-27-1961

## 2020-12-03 ENCOUNTER — Inpatient Hospital Stay: Payer: 59

## 2020-12-03 ENCOUNTER — Other Ambulatory Visit: Payer: Self-pay

## 2020-12-03 ENCOUNTER — Encounter: Payer: 59 | Admitting: Physical Therapy

## 2020-12-03 ENCOUNTER — Telehealth: Payer: Self-pay

## 2020-12-03 ENCOUNTER — Encounter: Payer: Self-pay | Admitting: Hematology and Oncology

## 2020-12-03 ENCOUNTER — Inpatient Hospital Stay: Payer: 59 | Attending: Hematology and Oncology

## 2020-12-03 ENCOUNTER — Inpatient Hospital Stay (HOSPITAL_BASED_OUTPATIENT_CLINIC_OR_DEPARTMENT_OTHER): Payer: 59 | Admitting: Hematology and Oncology

## 2020-12-03 VITALS — BP 146/70 | HR 72 | Temp 97.6°F | Resp 20

## 2020-12-03 DIAGNOSIS — D539 Nutritional anemia, unspecified: Secondary | ICD-10-CM | POA: Diagnosis not present

## 2020-12-03 DIAGNOSIS — R110 Nausea: Secondary | ICD-10-CM | POA: Diagnosis not present

## 2020-12-03 DIAGNOSIS — R5383 Other fatigue: Secondary | ICD-10-CM | POA: Diagnosis not present

## 2020-12-03 DIAGNOSIS — N183 Chronic kidney disease, stage 3 unspecified: Secondary | ICD-10-CM | POA: Insufficient documentation

## 2020-12-03 DIAGNOSIS — Z79899 Other long term (current) drug therapy: Secondary | ICD-10-CM | POA: Insufficient documentation

## 2020-12-03 DIAGNOSIS — D508 Other iron deficiency anemias: Secondary | ICD-10-CM

## 2020-12-03 DIAGNOSIS — D869 Sarcoidosis, unspecified: Secondary | ICD-10-CM | POA: Insufficient documentation

## 2020-12-03 DIAGNOSIS — D509 Iron deficiency anemia, unspecified: Secondary | ICD-10-CM | POA: Diagnosis not present

## 2020-12-03 DIAGNOSIS — I509 Heart failure, unspecified: Secondary | ICD-10-CM | POA: Insufficient documentation

## 2020-12-03 DIAGNOSIS — Z881 Allergy status to other antibiotic agents status: Secondary | ICD-10-CM | POA: Insufficient documentation

## 2020-12-03 DIAGNOSIS — R42 Dizziness and giddiness: Secondary | ICD-10-CM | POA: Diagnosis not present

## 2020-12-03 DIAGNOSIS — E119 Type 2 diabetes mellitus without complications: Secondary | ICD-10-CM | POA: Diagnosis not present

## 2020-12-03 LAB — SAMPLE TO BLOOD BANK

## 2020-12-03 LAB — CBC WITH DIFFERENTIAL/PLATELET
Abs Immature Granulocytes: 0.06 10*3/uL (ref 0.00–0.07)
Basophils Absolute: 0.1 10*3/uL (ref 0.0–0.1)
Basophils Relative: 1 %
Eosinophils Absolute: 0.4 10*3/uL (ref 0.0–0.5)
Eosinophils Relative: 5 %
HCT: 34.7 % — ABNORMAL LOW (ref 36.0–46.0)
Hemoglobin: 11 g/dL — ABNORMAL LOW (ref 12.0–15.0)
Immature Granulocytes: 1 %
Lymphocytes Relative: 26 %
Lymphs Abs: 2.3 10*3/uL (ref 0.7–4.0)
MCH: 27.5 pg (ref 26.0–34.0)
MCHC: 31.7 g/dL (ref 30.0–36.0)
MCV: 86.8 fL (ref 80.0–100.0)
Monocytes Absolute: 0.5 10*3/uL (ref 0.1–1.0)
Monocytes Relative: 6 %
Neutro Abs: 5.6 10*3/uL (ref 1.7–7.7)
Neutrophils Relative %: 61 %
Platelets: 328 10*3/uL (ref 150–400)
RBC: 4 MIL/uL (ref 3.87–5.11)
RDW: 13.6 % (ref 11.5–15.5)
WBC: 9 10*3/uL (ref 4.0–10.5)
nRBC: 0 % (ref 0.0–0.2)

## 2020-12-03 LAB — COMPREHENSIVE METABOLIC PANEL
ALT: 15 U/L (ref 0–44)
AST: 22 U/L (ref 15–41)
Albumin: 4.2 g/dL (ref 3.5–5.0)
Alkaline Phosphatase: 101 U/L (ref 38–126)
Anion gap: 12 (ref 5–15)
BUN: 22 mg/dL (ref 8–23)
CO2: 25 mmol/L (ref 22–32)
Calcium: 9.2 mg/dL (ref 8.9–10.3)
Chloride: 101 mmol/L (ref 98–111)
Creatinine, Ser: 1.33 mg/dL — ABNORMAL HIGH (ref 0.44–1.00)
GFR, Estimated: 46 mL/min — ABNORMAL LOW (ref >60)
Glucose, Bld: 153 mg/dL — ABNORMAL HIGH (ref 70–99)
Potassium: 4.1 mmol/L (ref 3.5–5.1)
Sodium: 138 mmol/L (ref 135–145)
Total Bilirubin: 0.5 mg/dL (ref 0.3–1.2)
Total Protein: 7.6 g/dL (ref 6.5–8.1)

## 2020-12-03 LAB — IRON AND TIBC
Iron: 56 ug/dL (ref 41–142)
Saturation Ratios: 11 % — ABNORMAL LOW (ref 21–57)
TIBC: 496 ug/dL — ABNORMAL HIGH (ref 236–444)
UIBC: 440 ug/dL — ABNORMAL HIGH (ref 120–384)

## 2020-12-03 LAB — FERRITIN: Ferritin: 84 ng/mL (ref 11–307)

## 2020-12-03 MED ORDER — SODIUM CHLORIDE 0.9 % IV SOLN
300.0000 mg | Freq: Once | INTRAVENOUS | Status: AC
  Administered 2020-12-03: 300 mg via INTRAVENOUS
  Filled 2020-12-03: qty 10

## 2020-12-03 MED ORDER — SODIUM CHLORIDE 0.9 % IV SOLN
INTRAVENOUS | Status: DC
  Filled 2020-12-03: qty 250

## 2020-12-03 NOTE — Telephone Encounter (Signed)
Called and left a message. Iron studies are a little low. Dr. Alvy Bimler sent a scheduling message to see her back in 2 months. Ask her to call the office back for questions.

## 2020-12-03 NOTE — Patient Instructions (Signed)

## 2020-12-03 NOTE — Assessment & Plan Note (Signed)
I have reviewed blood work with the patient and her husband Overall, she has multifactorial anemia She has received intravenous iron infusion with significant improvement of her blood count She has no infusion reactions She is scheduled to see GI service next month for colonoscopy Iron studies are still pending but due to persistent anemia, she will proceed with another dose of IV iron today I will call her with test results to determine when I should bring her back She also have a component of anemia chronic kidney disease She will continue aggressive risk factor modification with lifestyle changes and management of blood pressure and diabetes

## 2020-12-03 NOTE — Assessment & Plan Note (Addendum)
She has intermittent chronic kidney disease secondary to cardiac issues and diabetes ?She will continue aggressive risk factor modification ?There could be a component of anemia chronic kidney disease but with her current level of hemoglobin, she does not need ESA ?

## 2020-12-03 NOTE — Progress Notes (Signed)
[place] OFFICE PROGRESS NOTE  [name] Olp, MD  ASSESSMENT & PLAN:  Deficiency anemia I have reviewed blood work with the patient and her husband Overall, she has multifactorial anemia She has received intravenous iron infusion with significant improvement of her blood count She has no infusion reactions She is scheduled to see GI service next month for colonoscopy Iron studies are still pending but due to persistent anemia, she will proceed with another dose of IV iron today I will call her with test results to determine when I should bring her back She also have a component of anemia chronic kidney disease She will continue aggressive risk factor modification with lifestyle changes and management of blood pressure and diabetes  CKD (chronic kidney disease) stage 3, GFR 30-59 ml/min (HCC) She has intermittent chronic kidney disease secondary to cardiac issues and diabetes She will continue aggressive risk factor modification There could be a component of anemia chronic kidney disease but with her current level of hemoglobin, she does not need ESA   No orders of the defined types were placed in this encounter.   The total time spent in the appointment was 20 minutes encounter with patients including review of chart and various tests results, discussions about plan of care and coordination of care plan   All questions were answered. The patient knows to call the clinic with any problems, questions or concerns. No barriers to learning was detected.    [name] Lark, MD 2/23/202210:49 AM  INTERVAL HISTORY: [name] 60 y.o. female returns for follow-up 1 multifactorial anemia She felt better but still complain of excessive fatigue She has occasional rare rectal bleeding but not significant Denies shortness of breath or dizziness No infusion reactions  SUMMARY OF HEMATOLOGIC HISTORY: [name] 60 y.o. female is here because of recent progressive  anemia She is here accompanied by her husband I have the opportunity to review her blood test results from 2015 The patient has mild anemia over the last few years She has multiple chronic disease such as sarcoidosis, diabetes, congestive heart failure and others.   She has history of fundoplication.  She has multiple EGDs in the past, the last was in 2019 which showed no evidence of stomach ulcer or gastritis She takes chronic proton pump inhibitor Her hemoglobin stable between 10-11, most consistent with anemia of chronic disease  Around Halloween this year, she fell requiring left leg surgery.  Unfortunately, she developed complications with infection requiring IV antibiotics She also require blood transfusion intermittently According to her husband, advanced home care service has been drawing her blood from her PICC line and noted hemoglobin between 7.2-7.7 She has excessive fatigue This past Sunday, she had postural dizziness leading to a fall but she denies significant injuries She denies recent chest pain on exertion but has been somewhat sedentary since her surgery She was on chronic prednisone therapy but that was discontinued in October.  She use inhalers. She denies shortness of breath on minimal exertions or palpitations. She had not noticed any recent bleeding such as epistaxis, hematuria or hematochezia The patient denies over the counter NSAID ingestion. She is on antiplatelets agents and Lovenox since surgery  She had no prior history or diagnosis of cancer. Her age appropriate screening programs are up-to-date. She denies any pica and eats a variety of diet. She never donated blood  The patient was prescribed oral iron supplements and she takes 2 times a day and she tolerated that poorly.  She has significant nausea She received 2 doses of IV iron sucrose in January 2022  I have reviewed the past medical history, past surgical history, social history and family history with  the patient and they are unchanged from previous note.  ALLERGIES:  is allergic to methotrexate, vancomycin, lisinopril, chlorhexidine, clindamycin/lincomycin, doxycycline, and teflaro [ceftaroline].  MEDICATIONS:  Current Outpatient Medications  Medication Sig Dispense Refill  . albuterol (PROVENTIL HFA;VENTOLIN HFA) 108 (90 Base) MCG/ACT inhaler Inhale 1-2 puffs into the lungs every 6 (six) hours as needed for wheezing or shortness of breath. 8 g 5  . allopurinol (ZYLOPRIM) 100 MG tablet Take 100 mg by mouth every evening.     [name] aspirin EC 81 MG tablet Take 1 tablet (81 mg total) by mouth daily. 30 tablet 11  . atorvastatin (LIPITOR) 40 MG tablet TAKE 1 TABLET BY MOUTH EVERY DAY 90 tablet 3  . BREO ELLIPTA 200-25 MCG/INH AEPB TAKE 1 PUFF BY MOUTH EVERY DAY 60 each 5  . buPROPion (WELLBUTRIN XL) 300 MG 24 hr tablet TAKE 1 TABLET BY MOUTH EVERY DAY 90 tablet 3  . carvedilol (COREG) 12.5 MG tablet Take 12.5 mg by mouth 2 (two) times daily with a meal.    . chlorpheniramine-HYDROcodone (TUSSIONEX PENNKINETIC ER) 10-8 MG/5ML SUER Take 5 mLs by mouth at bedtime as needed for cough. 70 mL 0  . Cholecalciferol 25 MCG (1000 UT) capsule Take 1,000 Units by mouth in the morning and at bedtime.    . Colchicine 0.6 MG CAPS Take 0.6 mg by mouth 2 (two) times daily as needed for up to 4 days (may discontinue if symptoms improve). 20 capsule 1  . DEXILANT 60 MG capsule TAKE 1 CAPSULE BY MOUTH EVERY DAY 90 capsule 3  . diclofenac Sodium (VOLTAREN) 1 % GEL Apply 1 application topically as needed (pain.).     [name] diltiazem (CARDIZEM CD) 120 MG 24 hr capsule TAKE 1 CAPSULE BY MOUTH EVERY DAY 90 capsule 0  . Dulaglutide (TRULICITY) 3 SW/1.0XN SOPN Inject 0.5 mLs (3 mg total) into the skin once a week. 12 pen 3  . famotidine (PEPCID) 20 MG tablet Take 1 tablet (20 mg total) by mouth at bedtime. 30 tablet 3  . fenofibrate 160 MG tablet TAKE 1 TABLET BY MOUTH EVERY DAY 30 tablet 2  . fluticasone (FLONASE) 50 MCG/ACT  nasal spray Place 2 sprays into both nostrils daily as needed for allergies. 48 mL 3  . furosemide (LASIX) 40 MG tablet TAKE 1 TABLET BY MOUTH TWICE A DAY 180 tablet 1  . gabapentin (NEURONTIN) 100 MG capsule TAKE 1 CAPSULE BY MOUTH THREE TIMES A DAY 270 capsule 3  . glucose blood (CONTOUR NEXT TEST) test strip 1 each by Other route 4 (four) times daily. And lancets 4/day 400 each 3  . HYDROcodone-acetaminophen (NORCO/VICODIN) 5-325 MG tablet Take 1 tablet by mouth every 6 (six) hours as needed. 15 tablet 0  . insulin glargine (LANTUS SOLOSTAR) 100 UNIT/ML Solostar Pen Inject 25 Units into the skin every morning. 30 mL 2  . insulin lispro (HUMALOG KWIKPEN) 100 UNIT/ML KwikPen Inject 0.15 mLs (15 Units total) into the skin daily with supper. 15 mL 29  . Insulin Pen Needle (PEN NEEDLES) 32G X 4 MM MISC 1 each by Does not apply route 4 (four) times daily. E11.9 400 each 0  . KLOR-CON M20 20 MEQ tablet TAKE 1 & 1/2 TABLETS BY MOUTH TWICE A DAY 270 tablet 2  . lactulose (CHRONULAC) 10 GM/15ML  solution TAKE 15 MLS BY MOUTH DAILY AS NEEDED FOR CONSTIPATION. 1419 mL 1  . Lancet Devices (EASY MINI EJECT LANCING DEVICE) MISC Use as directed 4 times daily to test blood sugar 1 each 3  . linaclotide (LINZESS) 290 MCG CAPS capsule Take by mouth.    [name] LORazepam (ATIVAN) 0.5 MG tablet Take 0.5 mg by mouth every 12 (twelve) hours as needed for anxiety.    . metFORMIN (GLUCOPHAGE) 1000 MG tablet Take 1 tablet (1,000 mg total) by mouth 2 (two) times daily with a meal. 180 tablet 1  . Microlet Lancets MISC 1 each by Does not apply route 2 (two) times daily. E11.9 100 each 2  . nitroGLYCERIN (NITROSTAT) 0.4 MG SL tablet Place 1 tablet (0.4 mg total) under the tongue every 5 (five) minutes as needed for chest pain. Reported on 01/05/2016 25 tablet 6  . ondansetron (ZOFRAN-ODT) 4 MG disintegrating tablet Take 1 tablet (4 mg total) by mouth every 8 (eight) hours as needed for nausea or vomiting. 50 tablet 2  . telmisartan  (MICARDIS) 20 MG tablet TAKE 1 TABLET BY MOUTH EVERY DAY 90 tablet 1  . tiZANidine (ZANAFLEX) 4 MG tablet Take 0.5-1 tablets (2-4 mg total) by mouth every 8 (eight) hours as needed for muscle spasms. 30 tablet 1  . venlafaxine XR (EFFEXOR-XR) 75 MG 24 hr capsule TAKE 1 CAPSULE BY MOUTH TWICE A DAY 180 capsule 3   No current facility-administered medications for this visit.   Facility-Administered Medications Ordered in Other Visits  Medication Dose Route Frequency Provider Last Rate Last Admin  . iron sucrose (VENOFER) 300 mg in sodium chloride 0.9 % 250 mL IVPB  300 mg Intravenous Once Alvy Bimler, Lulani Bour, MD         REVIEW OF SYSTEMS:   Constitutional: Denies fevers, chills or night sweats Eyes: Denies blurriness of vision Ears, nose, mouth, throat, and face: Denies mucositis or sore throat Respiratory: Denies cough, dyspnea or wheezes Cardiovascular: Denies palpitation, chest discomfort or lower extremity swelling Gastrointestinal:  Denies nausea, heartburn or change in bowel habits Skin: Denies abnormal skin rashes Lymphatics: Denies new lymphadenopathy or easy bruising Neurological:Denies numbness, tingling or new weaknesses Behavioral/Psych: Mood is stable, no new changes  All other systems were reviewed with the patient and are negative.  PHYSICAL EXAMINATION: ECOG PERFORMANCE STATUS: 1 - Symptomatic but completely ambulatory  Vitals:   12/03/20 1015  BP: 139/64  Pulse: 77  Resp: 20  Temp: (!) 96.8 F (36 C)  SpO2: 98%   Filed Weights   12/03/20 1015  Weight: 219 lb (99.3 kg)    GENERAL:alert, no distress and comfortable SKIN: skin color, texture, turgor are normal, no rashes or significant lesions EYES: normal, Conjunctiva are pink and non-injected, sclera clear OROPHARYNX:no exudate, no erythema and lips, buccal mucosa, and tongue normal  NECK: supple, thyroid normal size, non-tender, without nodularity LYMPH:  no palpable lymphadenopathy in the cervical, axillary or  inguinal LUNGS: clear to auscultation and percussion with normal breathing effort HEART: regular rate & rhythm and no murmurs and no lower extremity edema ABDOMEN:abdomen soft, non-tender and normal bowel sounds Musculoskeletal:no cyanosis of digits and no clubbing  NEURO: alert & oriented x 3 with fluent speech, no focal motor/sensory deficits  LABORATORY DATA:  I have reviewed the data as listed     Component Value Date/Time   NA 138 12/03/2020 0953   NA 139 10/20/2020 0000   NA 135 (L) 04/17/2014 1039   K 4.1 12/03/2020 0953  K 4.5 04/17/2014 1039   CL 101 12/03/2020 0953   CO2 25 12/03/2020 0953   CO2 26 04/17/2014 1039   GLUCOSE 153 (H) 12/03/2020 0953   GLUCOSE 300 (H) 04/17/2014 1039   GLUCOSE 150 (H) 09/14/2006 1033   BUN 22 12/03/2020 0953   BUN 10 10/20/2020 0000   BUN 13.9 04/17/2014 1039   CREATININE 1.33 (H) 12/03/2020 0953   CREATININE 0.88 08/21/2020 1404   CREATININE 0.8 04/17/2014 1039   CALCIUM 9.2 12/03/2020 0953   CALCIUM 10.0 04/17/2014 1039   PROT 7.6 12/03/2020 0953   PROT 6.7 03/31/2020 1316   PROT 6.8 04/17/2014 1039   ALBUMIN 4.2 12/03/2020 0953   ALBUMIN 4.6 03/31/2020 1316   ALBUMIN 3.7 04/17/2014 1039   AST 22 12/03/2020 0953   AST 8 04/17/2014 1039   ALT 15 12/03/2020 0953   ALT 20 04/17/2014 1039   ALKPHOS 101 12/03/2020 0953   ALKPHOS 82 04/17/2014 1039   BILITOT 0.5 12/03/2020 0953   BILITOT 0.3 03/31/2020 1316   BILITOT 0.45 04/17/2014 1039   GFRNONAA 46 (L) 12/03/2020 0953   GFRNONAA 71 08/21/2020 1404   GFRAA 96 10/20/2020 0000   GFRAA 82 08/21/2020 1404    No results found for: SPEP, UPEP  Lab Results  Component Value Date   WBC 9.0 12/03/2020   NEUTROABS 5.6 12/03/2020   HGB 11.0 (L) 12/03/2020   HCT 34.7 (L) 12/03/2020   MCV 86.8 12/03/2020   PLT 328 12/03/2020      Chemistry      Component Value Date/Time   NA 138 12/03/2020 0953   NA 139 10/20/2020 0000   NA 135 (L) 04/17/2014 1039   K 4.1 12/03/2020 0953    K 4.5 04/17/2014 1039   CL 101 12/03/2020 0953   CO2 25 12/03/2020 0953   CO2 26 04/17/2014 1039   BUN 22 12/03/2020 0953   BUN 10 10/20/2020 0000   BUN 13.9 04/17/2014 1039   CREATININE 1.33 (H) 12/03/2020 0953   CREATININE 0.88 08/21/2020 1404   CREATININE 0.8 04/17/2014 1039   GLU 223 10/20/2020 0000      Component Value Date/Time   CALCIUM 9.2 12/03/2020 0953   CALCIUM 10.0 04/17/2014 1039   ALKPHOS 101 12/03/2020 0953   ALKPHOS 82 04/17/2014 1039   AST 22 12/03/2020 0953   AST 8 04/17/2014 1039   ALT 15 12/03/2020 0953   ALT 20 04/17/2014 1039   BILITOT 0.5 12/03/2020 0953   BILITOT 0.3 03/31/2020 1316   BILITOT 0.45 04/17/2014 1039       RADIOGRAPHIC STUDIES: I have personally reviewed the radiological images as listed and agreed with the findings in the report. No results found.

## 2020-12-04 ENCOUNTER — Telehealth: Payer: Self-pay | Admitting: Hematology and Oncology

## 2020-12-04 NOTE — Telephone Encounter (Signed)
Scheduled appt per 2/23 sch msg - pt is aware of appt date and time

## 2020-12-08 DIAGNOSIS — T8454XD Infection and inflammatory reaction due to internal left knee prosthesis, subsequent encounter: Secondary | ICD-10-CM | POA: Diagnosis not present

## 2020-12-08 DIAGNOSIS — M9712XD Periprosthetic fracture around internal prosthetic left knee joint, subsequent encounter: Secondary | ICD-10-CM | POA: Diagnosis not present

## 2020-12-09 ENCOUNTER — Encounter: Payer: 59 | Admitting: Physical Therapy

## 2020-12-11 ENCOUNTER — Encounter: Payer: Self-pay | Admitting: Physical Therapy

## 2020-12-11 ENCOUNTER — Ambulatory Visit (INDEPENDENT_AMBULATORY_CARE_PROVIDER_SITE_OTHER): Payer: 59 | Admitting: Physical Therapy

## 2020-12-11 ENCOUNTER — Other Ambulatory Visit: Payer: Self-pay

## 2020-12-11 DIAGNOSIS — M7581 Other shoulder lesions, right shoulder: Secondary | ICD-10-CM | POA: Diagnosis not present

## 2020-12-11 DIAGNOSIS — M79605 Pain in left leg: Secondary | ICD-10-CM | POA: Diagnosis not present

## 2020-12-11 DIAGNOSIS — M25662 Stiffness of left knee, not elsewhere classified: Secondary | ICD-10-CM

## 2020-12-11 DIAGNOSIS — M6281 Muscle weakness (generalized): Secondary | ICD-10-CM | POA: Diagnosis not present

## 2020-12-11 DIAGNOSIS — M5412 Radiculopathy, cervical region: Secondary | ICD-10-CM | POA: Diagnosis not present

## 2020-12-11 NOTE — Patient Instructions (Signed)
Access Code: [email or web address] URL: [email or web address] Date: 12/11/2020 Prepared by: Lyndee Hensen  Exercises Supine Heel Slide - 2 x daily - 2 sets - 10 reps Straight Leg Raise - 1 x daily - 2 sets - 10 reps Sidelying Hip Abduction - 1 x daily - 2 sets - 10 reps Step Up - 1 x daily - 1 sets - 10 reps Standing Hip Abduction with Counter Support - 1 x daily - 1-2 sets - 10 reps Seated Knee Extension AROM - 1 x daily - 2 sets - 10 reps Side Stepping with Counter Support - 1 x daily - 2 sets - 10 reps

## 2020-12-13 ENCOUNTER — Encounter: Payer: Self-pay | Admitting: Physical Therapy

## 2020-12-13 NOTE — Therapy (Signed)
[facility] [address] [place], [name], [address] Phone: [phone]   Fax:  [phone]  Physical Therapy Treatment  Patient Details  Name: [name] MRN: [id number] Date of Birth: 1961/02/28 Referring Provider (PT): Garret Reddish   Encounter Date: 12/11/2020   PT End of Session - 12/13/20 1441    Visit Number 8    Number of Visits 12    Date for PT Re-Evaluation 12/15/20    Authorization Type U[employer]    PT Start Time 6440    PT Stop Time 1515    PT Time Calculation (min) 40 min    Activity Tolerance Patient tolerated treatment well    Behavior During Therapy [facility] for tasks assessed/performed           Past Medical History:  Diagnosis Date  . Abnormal SPEP 04/17/2014  . Acute left ankle pain 01/26/2017  . ANEMIA-UNSPECIFIED 09/18/2009  . Anxiety   . Arthritis   . Chronic diastolic heart failure, NYHA class 2 (HCC)    Normal LVEDP by May 2018  . COPD (chronic obstructive pulmonary disease) ([place])   . Depression   . DIABETES MELLITUS, TYPE II 08/21/2006  . Diabetic osteomyelitis ([place]) 05/29/2015  . Fatty liver   . Fracture of 5th metatarsal    non union  . GERD 34/74/2595   Had fundoplication  . GOUT 08/20/2010  . Hammer toe of right foot    2-5th toes  . Hx of umbilical hernia repair   . HYPERLIPIDEMIA 08/21/2006  . HYPERTENSION 08/21/2006  . Infection of wound due to methicillin resistant Staphylococcus aureus (MRSA)   . Internal hemorrhoids   . Kidney problem   . Multiple allergies 10/14/2016  . OBESITY 06/04/2009  . Onychomycosis 10/27/2015  . Osteomyelitis of left foot ([place]) 05/29/2015  . Pulmonary sarcoidosis ([place])    Followed locally by pulmonology, but also by Dr. Casper Harrison at [facility]  . Right knee pain 01/26/2017  . Vocal cord dysfunction   . Wears partial dentures     Past Surgical History:  Procedure Laterality Date  . ABDOMINAL HYSTERECTOMY    . APPENDECTOMY    . BLADDER SUSPENSION   11/11/2011   Procedure: TRANSVAGINAL TAPE (TVT) PROCEDURE;  Surgeon: Olga Millers, MD;  Location: [facility];  Service: Gynecology;  Laterality: N/A;  . CAROTIDS  02/18/11   CAROTID DUPLEX; VERTEBRALS ARE PATENT WITH ANTEGRADE FLOW. ICA/CCA RATIO 1.61 ON RIGHT AND 0.75 ON LEFT  . CHOLECYSTECTOMY  1984  . COLONOSCOPY  04/29/2010   [name]  . CYSTOSCOPY  11/11/2011   Procedure: CYSTOSCOPY;  Surgeon: Olga Millers, MD;  Location: [facility];  Service: Gynecology;  Laterality: N/A;  . EXTUBATION (ENDOTRACHEAL) IN OR N/A 09/23/2017   Procedure: EXTUBATION (ENDOTRACHEAL) IN OR;  Surgeon: Helayne Seminole, MD;  Location: [facility];  Service: ENT;  Laterality: N/A;  . FIBEROPTIC LARYNGOSCOPY AND TRACHEOSCOPY N/A 09/23/2017   Procedure: FLEXIBLE FIBEROPTIC LARYNGOSCOPY;  Surgeon: Helayne Seminole, MD;  Location: [facility];  Service: ENT;  Laterality: N/A;  . FRACTURE SURGERY     foot  . HALLUX FUSION Left 10/02/2018   Procedure: HALLUX ARTHRODESIS;  Surgeon: Edrick Kins, DPM;  Location: [facility];  Service: Podiatry;  Laterality: Left;  . HAMMER TOE SURGERY Left 10/02/2018   Procedure: HAMMER TOE CORRECTION 2ND 3RD 4RD FIFTH TOE;  Surgeon: Edrick Kins, DPM;  Location: [facility];  Service: Podiatry;  Laterality: Left;  . HAMMER TOE SURGERY Right 04/12/2019  Procedure: HAMMER TOE CORRECTION, 2ND, 3RD, 4TH AND 5TH TOES OF RIGHT FOOT;  Surgeon: Edrick Kins, DPM;  Location: [facility];  Service: Podiatry;  Laterality: Right;  . HAMMER TOE SURGERY Left 10/25/2019   Procedure: HAMMER TOE REPAIR SECOND, THIRD, FOUTH;  Surgeon: Edrick Kins, DPM;  Location: [facility];  Service: Podiatry;  Laterality: Left;  . HERNIA REPAIR    . I & D EXTREMITY Left 06/27/2015   Procedure: Partial Excision Left Calcaneus, Place Antibiotic Beads, and Wound VAC;  Surgeon: Newt Minion, MD;  Location: [facility];  Service: Orthopedics;  Laterality: Left;  . JOINT REPLACEMENT    . KNEE ARTHROSCOPY     right  . LEFT AND RIGHT HEART  CATHETERIZATION WITH CORONARY ANGIOGRAM N/A 04/23/2013   Procedure: LEFT AND RIGHT HEART CATHETERIZATION WITH CORONARY ANGIOGRAM;  Surgeon: Leonie Man, MD;  Location: [facility];  Service: Cardiovascular;  Laterality: N/A;  . LEFT HEART CATH AND CORONARY ANGIOGRAPHY N/A 03/11/2017   Procedure: Left Heart Cath and Coronary Angiography;  Surgeon: Sherren Mocha, MD;  Location: [facility] CV LAB; angiographically minimal CAD in the LAD otherwise normal.;  Normal LVEDP.  FALSE POSITIVE MYOVIEW  . LEFT HEART CATH AND CORONARY ANGIOGRAPHY  07/20/2010   LVEF 50-55% WITH VERY MILD GLOBAL HYPOKINESIA; ESSENTIALLY NORMAL CORONARY ARTERIES; NORMAL LV FUNCTION  . METATARSAL OSTEOTOMY WITH OPEN REDUCTION INTERNAL FIXATION (ORIF) METATARSAL WITH FUSION Left 04/09/2014   Procedure: LEFT FOOT FRACTURE OPEN TREATMENT METATARSAL INCLUDES INTERNAL FIXATION EACH;  Surgeon: Lorn Junes, MD;  Location: [facility];  Service: Orthopedics;  Laterality: Left;  . NISSEN FUNDOPLICATION  3428  . NM MYOVIEW LTD --> FALSE POSITIVE  03/10/2017   : Moderate size "stress-induced "perfusion defect at the apex as well as "ill-defined stress-induced perfusion defect in the lateral wall.  EF 55%.  INTERMEDIATE risk. -->  FALSE POSITIVE  . ORIF DISTAL FEMUR FRACTURE  08/08/2020   [facility]: ORIF of left extra-articular periprosthetic distal femur fracture with synthesis of the femur plate with cortical nonlocking screws.  (Thought to be pathologic fracture due to osteoporosis) -> after a fall  . ORIF FEMUR W/ PERI-IMPLANT  08/29/2020   [facility]) revision of left femur fracture ORIF plate fixation screws; culture positive for MSSA  . Right and left CARDIAC CATHETERIZATION  04/23/2013   Angiographic normal coronaries; LVEDP 20 mmHg, PCWP 12-14 mmHg, RAP 12 mmHg.; Fick CO/CI 4.9/2.2  . SHOULDER ARTHROSCOPY Left 03/14/2019   Procedure: LEFT SHOULDER ARTHROSCOPY, DEBRIDEMENT, AND  DECOMPRESSION;  Surgeon: Newt Minion, MD;  Location: [facility];  Service: Orthopedics;  Laterality: Left;  . TOTAL KNEE ARTHROPLASTY Right 06/29/2017   Procedure: RIGHT TOTAL KNEE ARTHROPLASTY;  Surgeon: Newt Minion, MD;  Location: [facility];  Service: Orthopedics;  Laterality: Right;  . TOTAL KNEE ARTHROPLASTY Left 12/07/2017  . TOTAL KNEE ARTHROPLASTY Left 12/07/2017   Procedure: LEFT TOTAL KNEE ARTHROPLASTY;  Surgeon: Newt Minion, MD;  Location: [place] [facility];  Service: Orthopedics;  Laterality: Left;  . TRACHEOSTOMY TUBE PLACEMENT N/A 09/20/2017   Procedure: AWAKE INTUBATION WITH ANESTHESIA WITH VIDEO ASSISTANCE;  Surgeon: Helayne Seminole, MD;  Location: [facility];  Service: ENT;  Laterality: N/A;  . TRANSTHORACIC ECHOCARDIOGRAM  08/2014   Normal LV size and function.  Mild LVH.  EF 55-60%.  Normal regional wall motion.  GR 1 DD.  Normal RV size and function .  [name] TRANSTHORACIC ECHOCARDIOGRAM  08/12/2020    [facility]  [facility] [place] [facility]): Normal LV size and function.  EF 55 to 60%.  No RWM A.  Normal filling pattern.  Normal RV.  No significant valvular disease-stenosis or regurgitation.  No vegetation.  . TUBAL LIGATION     with reversal in 1994  . VENTRAL HERNIA REPAIR      There were no vitals filed for this visit.   Subjective Assessment - 12/13/20 1441    Subjective Pt doing very well, states no pain. Forgot cane today. She saw MD this week, with good report. Presents with slight limp without SPC.    Currently in Pain? No/denies                             [facility] Adult PT Treatment/Exercise - 12/13/20 0001      Exercises   Exercises Knee/Hip      Knee/Hip Exercises: Aerobic   Recumbent Bike L 2 x 8 min      Knee/Hip Exercises: Standing   Hip Flexion 20 reps;Knee bent    Hip Abduction 10 reps;Both;2 sets    Forward Step Up 10 reps;Hand Hold: 1;Step Height: 6"    Functional Squat 2 sets;10 reps   at mat table   Other Standing Knee Exercises walk/march 10 ft x 4;       Knee/Hip Exercises: Seated   Long Arc Quad 20 reps    Long Arc Quad Weight 2 lbs.      Knee/Hip Exercises: Supine   Bridges with Clamshell 2 sets;10 reps    Straight Leg Raises 2 sets;10 reps;Both      Knee/Hip Exercises: Sidelying   Hip ABduction 20 reps      Manual Therapy   Manual Therapy Soft tissue mobilization;Passive ROM    Passive ROM for L knee flexion                  PT Education - 12/13/20 1441    Education Details UPdated, Reviewed HEP    Person(s) Educated Patient    Methods Explanation;Demonstration;Handout    Comprehension Verbalized understanding;Returned demonstration;Verbal cues required;Need further instruction            PT Short Term Goals - 12/01/20 1524      PT SHORT TERM GOAL #1   Title Pt to be independent with initial HEP    Time 2    Period Weeks    Status Achieved    Target Date 11/17/20      PT SHORT TERM GOAL #2   Title Pt to demo improved ROM of L knee by at 10 degrees    Time 4    Period Weeks    Status Partially Met    Target Date 11/17/20             PT Long Term Goals - 12/01/20 1524      PT LONG TERM GOAL #1   Title Pt to be independent with HEP    Time 6    Period Weeks    Status Partially Met    Target Date 12/15/20      PT LONG TERM GOAL #2   Title Pt to demo L knee ROM to be at least 115 deg    Time 6    Period Weeks    Status On-going    Target Date 12/15/20      PT LONG TERM GOAL #3   Title Pt to demo L quad at hip strength  to be at least 4+/5 to improve stability and gait    Time 6    Period Weeks    Status On-going    Target Date 12/15/20      PT LONG TERM GOAL #4   Title Pt to demo stability and [place] of L LE to be WNL with gait, dynamic balance and single leg stance    Time 6    Period Weeks    Status On-going    Target Date 12/15/20                 Plan - 12/13/20 1442    Clinical Impression Statement Pt making good progress. Pt with improving strength in LE, and improving  ability for activity. Pt doing very well with HEP. She continues to have joint stiffness in R knee, minimal improvments with flexion ROM. Pt also with noted instability and weakness in hip and knee, seen with most single leg and stair activities and gait with trendelenberg. Discussed good improvments, but also discussed benefits of continued strengthening for optimal outcome. Pt to benefit from continued care. .    Personal Factors and Comorbidities Fitness;Comorbidity 1    Comorbidities surgery with complications, L TKA already present    Examination-Activity Limitations Stand;Stairs;Squat;Locomotion Level    Examination-Participation Restrictions Cleaning;Community Activity;Shop    Stability/Clinical Decision Making Evolving/Moderate complexity    Rehab Potential Good    PT Frequency 2x / week    PT Duration 6 weeks    PT Treatment/Interventions ADLs/Self Care Home Management;Cryotherapy;Electrical Stimulation;DME Instruction;Ultrasound;Moist Heat;Iontophoresis 4mg /ml Dexamethasone;Gait training;Stair training;Functional mobility training;Therapeutic activities;Therapeutic exercise;Balance training;Patient/family education;Neuromuscular re-education;Manual techniques;Passive range of motion;Dry needling;Taping;Vasopneumatic Device;Spinal Manipulations;Joint Manipulations    PT Next Visit Plan review squatting, bridge with band, review step up, TKE    Consulted and Agree with Plan of Care Patient           Patient will benefit from skilled therapeutic intervention in order to improve the following deficits and impairments:  Abnormal gait,Decreased range of motion,Difficulty walking,Increased muscle spasms,Decreased activity tolerance,Pain,Decreased balance,Impaired flexibility,Hypomobility,Decreased mobility,Decreased strength,Increased edema  Visit Diagnosis: Muscle weakness (generalized)  Pain in left leg  Stiffness of knee joint, left     Problem List Patient Active Problem List    Diagnosis Date Noted  . Iron deficiency anemia 10/01/2020  . Postural dizziness with presyncope 09/30/2020  . Hypertriglyceridemia 04/02/2020  . Aortic atherosclerosis ([place]) 02/14/2020  . Nontraumatic complete tear of left rotator cuff   . Diabetic neuropathy ([place]) 02/20/2019  . Severe persistent asthma 01/04/2019  . Subcortical microvascular ischemic occlusive disease 07/12/2018  . Rapid palpitations 07/12/2018  . Impingement of left ankle joint 06/26/2018  . Constipation 04/21/2018  . Total knee replacement status, left 12/07/2017  . Dysphagia 09/20/2017  . Epiglottitis   . Plantar fasciitis, right 07/13/2017  . S/P total knee arthroplasty, right 06/29/2017  . Osteoarthrosis, localized, primary, knee, right   . Sprain of calcaneofibular ligament of right ankle 06/06/2017  . Osteoarthritis of right knee 01/26/2017  . Onychomycosis 10/27/2015  . CKD (chronic kidney disease) stage 3, GFR 30-59 ml/min (HCC) 04/27/2015  . MRSA (methicillin resistant staph aureus) culture positive 03/27/2015  . History of osteomyelitis 03/27/2015  . Fatty liver 09/30/2014  . Hot flashes 07/15/2014  . Abnormal SPEP 04/17/2014  . Fracture of left leg 04/17/2014  . Cushingoid side effect of steroids ([place]) 04/17/2014  . Internal hemorrhoids   . Pulmonary sarcoidosis ([place])   . Major depression in full remission ([place])   . Preoperative clearance 03/25/2014  .  Obstructive chronic bronchitis without exacerbation ([place]) 09/18/2013  . Chest pain 04/11/2013  . Hypertensive heart disease with chronic diastolic congestive heart failure ([place])   . Solitary pulmonary nodule, on CT 02/2013 - stable over 2 years in 2015 02/20/2013  . Gout 08/20/2010  . Deficiency anemia 09/18/2009  . Morbid obesity ([place]) 06/04/2009  . Sleep apnea 04/21/2009  . Sarcoidosis of lung ([place]) 04/10/2007  . Hyperlipidemia associated with type 2 diabetes mellitus ([place]) 08/21/2006  . Hypertension associated with diabetes ([place]) 08/21/2006  .  GERD 08/21/2006  . Type II diabetes mellitus with renal manifestations ([place]) 08/21/2006    Lyndee Hensen, PT, DPT 2:44 PM  12/13/20    [facility] [place], [name], [address] Phone: [phone]   Fax:  [phone]  Name: SHANECIA HOGANSON MRN: [id number] Date of Birth: [birth year]

## 2020-12-15 ENCOUNTER — Encounter: Payer: 59 | Admitting: Physical Therapy

## 2020-12-16 ENCOUNTER — Telehealth: Payer: Self-pay | Admitting: Family Medicine

## 2020-12-16 ENCOUNTER — Other Ambulatory Visit: Payer: Self-pay | Admitting: Cardiology

## 2020-12-16 NOTE — Telephone Encounter (Signed)
Recv'd records from [facility] forwarded 2 pages to Dr. Garret Reddish 3/8/22fbg

## 2020-12-17 ENCOUNTER — Encounter: Payer: 59 | Admitting: Physical Therapy

## 2020-12-19 ENCOUNTER — Ambulatory Visit (INDEPENDENT_AMBULATORY_CARE_PROVIDER_SITE_OTHER): Payer: 59 | Admitting: Physician Assistant

## 2020-12-19 ENCOUNTER — Encounter: Payer: Self-pay | Admitting: Physician Assistant

## 2020-12-19 ENCOUNTER — Other Ambulatory Visit: Payer: Self-pay

## 2020-12-19 VITALS — BP 138/70 | HR 71 | Temp 97.3°F | Ht 66.0 in | Wt 218.0 lb

## 2020-12-19 DIAGNOSIS — R350 Frequency of micturition: Secondary | ICD-10-CM

## 2020-12-19 DIAGNOSIS — R0981 Nasal congestion: Secondary | ICD-10-CM

## 2020-12-19 DIAGNOSIS — R5383 Other fatigue: Secondary | ICD-10-CM

## 2020-12-19 LAB — CBC WITH DIFFERENTIAL/PLATELET
Basophils Absolute: 0.1 10*3/uL (ref 0.0–0.1)
Basophils Relative: 0.5 % (ref 0.0–3.0)
Eosinophils Absolute: 0.3 10*3/uL (ref 0.0–0.7)
Eosinophils Relative: 1.9 % (ref 0.0–5.0)
HCT: 34.2 % — ABNORMAL LOW (ref 36.0–46.0)
Hemoglobin: 11.2 g/dL — ABNORMAL LOW (ref 12.0–15.0)
Lymphocytes Relative: 24.1 % (ref 12.0–46.0)
Lymphs Abs: 3.2 10*3/uL (ref 0.7–4.0)
MCHC: 32.6 g/dL (ref 30.0–36.0)
MCV: 85.4 fl (ref 78.0–100.0)
Monocytes Absolute: 0.8 10*3/uL (ref 0.1–1.0)
Monocytes Relative: 5.9 % (ref 3.0–12.0)
Neutro Abs: 9 10*3/uL — ABNORMAL HIGH (ref 1.4–7.7)
Neutrophils Relative %: 67.6 % (ref 43.0–77.0)
Platelets: 367 10*3/uL (ref 150.0–400.0)
RBC: 4.01 Mil/uL (ref 3.87–5.11)
RDW: 15.3 % (ref 11.5–15.5)
WBC: 13.4 10*3/uL — ABNORMAL HIGH (ref 4.0–10.5)

## 2020-12-19 LAB — COMPREHENSIVE METABOLIC PANEL
ALT: 9 U/L (ref 0–35)
AST: 14 U/L (ref 0–37)
Albumin: 4.4 g/dL (ref 3.5–5.2)
Alkaline Phosphatase: 84 U/L (ref 39–117)
BUN: 24 mg/dL — ABNORMAL HIGH (ref 6–23)
CO2: 30 mEq/L (ref 19–32)
Calcium: 9.7 mg/dL (ref 8.4–10.5)
Chloride: 100 mEq/L (ref 96–112)
Creatinine, Ser: 1.23 mg/dL — ABNORMAL HIGH (ref 0.40–1.20)
GFR: 47.47 mL/min — ABNORMAL LOW (ref >60.00)
Glucose, Bld: 110 mg/dL — ABNORMAL HIGH (ref 70–99)
Potassium: 5.6 mEq/L — ABNORMAL HIGH (ref 3.5–5.1)
Sodium: 136 mEq/L (ref 135–145)
Total Bilirubin: 0.6 mg/dL (ref 0.2–1.2)
Total Protein: 6.9 g/dL (ref 6.0–8.3)

## 2020-12-19 LAB — POC URINALSYSI DIPSTICK (AUTOMATED)
Bilirubin, UA: NEGATIVE
Blood, UA: NEGATIVE
Glucose, UA: NEGATIVE
Ketones, UA: NEGATIVE
Leukocytes, UA: NEGATIVE
Nitrite, UA: NEGATIVE
Protein, UA: NEGATIVE
Spec Grav, UA: 1.015 (ref 1.010–1.025)
Urobilinogen, UA: 0.2 E.U./dL
pH, UA: 6.5 (ref 5.0–8.0)

## 2020-12-19 LAB — FERRITIN: Ferritin: 119 ng/mL (ref 10.0–291.0)

## 2020-12-19 LAB — CK: Total CK: 20 U/L (ref 7–177)

## 2020-12-19 LAB — B12 AND FOLATE PANEL
Folate: 7.5 ng/mL (ref >5.9)
Vitamin B-12: 171 pg/mL — ABNORMAL LOW (ref 211–911)

## 2020-12-19 LAB — TSH: TSH: 1.94 u[IU]/mL (ref 0.35–4.50)

## 2020-12-19 MED ORDER — IPRATROPIUM BROMIDE 0.03 % NA SOLN: 2.0000 | Freq: Two times a day (BID) | NASAL | 2 refills | Status: DC

## 2020-12-19 NOTE — Patient Instructions (Signed)
It was great to see you!  Let's update blood work and urine today.  Start nasal saline spray (i.e., Simply Saline) or nasal saline lavage (i.e., NeilMed) is recommended as needed and prior to medicated nasal sprays. Hold flonase for now and start atrovent nasal spray. If sinus symptoms worsen, let us know.  We will be in touch with results as soon as they return.  Take care,  Inda Coke PA-C

## 2020-12-19 NOTE — Progress Notes (Signed)
[name] is a 60 y.o. female here for a new problem.  I acted as a Education administrator for [employer], PA-C Anselmo Pickler, LPN   History of Present Illness:   Chief Complaint  Patient presents with  . Fatigue    HPI  Fatigue Pt c/o increase in fatigue for the past few days.  She states that she feels like she does when she normally needs an iron infusion.  She actually just had an iron infusion on February 23.  She feels like she has been sleeping a lot more and over the past few days has developed some sinus congestion.  She also states that she is using Flonase regularly and has had a little bit of dried blood in her nostrils.  She has had some ear pain with this as well.  Blood work was updated most recently on 2/23 at hematology  She denies: Shortness of breath, chest pain, lower extremity swelling, changes to medications or noncompliance with medications, lack of adequate nutrition or hydration, unusual pain, rectal bleeding, presyncope, unusual headaches.  She also denies any known Covid exposures or other exposures to illnesses.  Urinary frequency He does report that she has been peeing a little bit more frequently over the past day or so.  She has been treated in the past for urinary tract infections without significant symptoms.  She denies any back pain, fever, chills, nausea.   Past Medical History:  Diagnosis Date  . Abnormal SPEP 04/17/2014  . Acute left ankle pain 01/26/2017  . ANEMIA-UNSPECIFIED 09/18/2009  . Anxiety   . Arthritis   . Chronic diastolic heart failure, NYHA class 2 (HCC)    Normal LVEDP by May 2018  . COPD (chronic obstructive pulmonary disease) ([place])   . Depression   . DIABETES MELLITUS, TYPE II 08/21/2006  . Diabetic osteomyelitis ([place]) 05/29/2015  . Fatty liver   . Fracture of 5th metatarsal    non union  . GERD 86/57/8469   Had fundoplication  . GOUT 08/20/2010  . Hammer toe of right foot    2-5th toes  . Hx of umbilical hernia repair   .  HYPERLIPIDEMIA 08/21/2006  . HYPERTENSION 08/21/2006  . Infection of wound due to methicillin resistant Staphylococcus aureus (MRSA)   . Internal hemorrhoids   . Kidney problem   . Multiple allergies 10/14/2016  . OBESITY 06/04/2009  . Onychomycosis 10/27/2015  . Osteomyelitis of left foot ([place]) 05/29/2015  . Pulmonary sarcoidosis ([place])    Followed locally by pulmonology, but also by Dr. Casper Harrison at [facility]  . Right knee pain 01/26/2017  . Vocal cord dysfunction   . Wears partial dentures      Social History   Tobacco Use  . Smoking status: Never Smoker  . Smokeless tobacco: Never Used  Vaping Use  . Vaping Use: Never used  Substance Use Topics  . Alcohol use: No    Alcohol/week: 0.0 standard drinks  . Drug use: No    Past Surgical History:  Procedure Laterality Date  . ABDOMINAL HYSTERECTOMY    . APPENDECTOMY    . BLADDER SUSPENSION  11/11/2011   Procedure: TRANSVAGINAL TAPE (TVT) PROCEDURE;  Surgeon: Olga Millers, MD;  Location: [facility] [facility];  Service: Gynecology;  Laterality: N/A;  . CAROTIDS  02/18/11   CAROTID DUPLEX; VERTEBRALS ARE PATENT WITH ANTEGRADE FLOW. ICA/CCA RATIO 1.61 ON RIGHT AND 0.75 ON LEFT  . CHOLECYSTECTOMY  1984  . COLONOSCOPY  04/29/2010   [name]  . CYSTOSCOPY  11/11/2011   Procedure: CYSTOSCOPY;  Surgeon: Olga Millers, MD;  Location: [place] [facility];  Service: Gynecology;  Laterality: N/A;  . EXTUBATION (ENDOTRACHEAL) IN OR N/A 09/23/2017   Procedure: EXTUBATION (ENDOTRACHEAL) IN OR;  Surgeon: Helayne Seminole, MD;  Location: [place];  Service: ENT;  Laterality: N/A;  . FIBEROPTIC LARYNGOSCOPY AND TRACHEOSCOPY N/A 09/23/2017   Procedure: FLEXIBLE FIBEROPTIC LARYNGOSCOPY;  Surgeon: Helayne Seminole, MD;  Location: [place];  Service: ENT;  Laterality: N/A;  . FRACTURE SURGERY     foot  . HALLUX FUSION Left 10/02/2018   Procedure: HALLUX ARTHRODESIS;  Surgeon: Edrick Kins, DPM;  Location: [facility];  Service: Podiatry;  Laterality: Left;  . HAMMER  TOE SURGERY Left 10/02/2018   Procedure: HAMMER TOE CORRECTION 2ND 3RD 4RD FIFTH TOE;  Surgeon: Edrick Kins, DPM;  Location: [facility];  Service: Podiatry;  Laterality: Left;  . HAMMER TOE SURGERY Right 04/12/2019   Procedure: HAMMER TOE CORRECTION, 2ND, 3RD, 4TH AND 5TH TOES OF RIGHT FOOT;  Surgeon: Edrick Kins, DPM;  Location: [place];  Service: Podiatry;  Laterality: Right;  . HAMMER TOE SURGERY Left 10/25/2019   Procedure: HAMMER TOE REPAIR SECOND, THIRD, FOUTH;  Surgeon: Edrick Kins, DPM;  Location: [place] [facility];  Service: Podiatry;  Laterality: Left;  . HERNIA REPAIR    . I & D EXTREMITY Left 06/27/2015   Procedure: Partial Excision Left Calcaneus, Place Antibiotic Beads, and Wound VAC;  Surgeon: Newt Minion, MD;  Location: [place];  Service: Orthopedics;  Laterality: Left;  . JOINT REPLACEMENT    . KNEE ARTHROSCOPY     right  . LEFT AND RIGHT HEART CATHETERIZATION WITH CORONARY ANGIOGRAM N/A 04/23/2013   Procedure: LEFT AND RIGHT HEART CATHETERIZATION WITH CORONARY ANGIOGRAM;  Surgeon: Leonie Man, MD;  Location: [facility] CATH [facility];  Service: Cardiovascular;  Laterality: N/A;  . LEFT HEART CATH AND CORONARY ANGIOGRAPHY N/A 03/11/2017   Procedure: Left Heart Cath and Coronary Angiography;  Surgeon: Sherren Mocha, MD;  Location: [place] CV LAB; angiographically minimal CAD in the LAD otherwise normal.;  Normal LVEDP.  FALSE POSITIVE MYOVIEW  . LEFT HEART CATH AND CORONARY ANGIOGRAPHY  07/20/2010   LVEF 50-55% WITH VERY MILD GLOBAL HYPOKINESIA; ESSENTIALLY NORMAL CORONARY ARTERIES; NORMAL LV FUNCTION  . METATARSAL OSTEOTOMY WITH OPEN REDUCTION INTERNAL FIXATION (ORIF) METATARSAL WITH FUSION Left 04/09/2014   Procedure: LEFT FOOT FRACTURE OPEN TREATMENT METATARSAL INCLUDES INTERNAL FIXATION EACH;  Surgeon: Lorn Junes, MD;  Location: [place];  Service: Orthopedics;  Laterality: Left;  . NISSEN FUNDOPLICATION  5361  . NM MYOVIEW LTD --> FALSE POSITIVE  03/10/2017   : Moderate  size "stress-induced "perfusion defect at the apex as well as "ill-defined stress-induced perfusion defect in the lateral wall.  EF 55%.  INTERMEDIATE risk. -->  FALSE POSITIVE  . ORIF DISTAL FEMUR FRACTURE  08/08/2020   [facility]: ORIF of left extra-articular periprosthetic distal femur fracture with synthesis of the femur plate with cortical nonlocking screws.  (Thought to be pathologic fracture due to osteoporosis) -> after a fall  . ORIF FEMUR W/ PERI-IMPLANT  08/29/2020   [facility]) revision of left femur fracture ORIF plate fixation screws; culture positive for MSSA  . Right and left CARDIAC CATHETERIZATION  04/23/2013   Angiographic normal coronaries; LVEDP 20 mmHg, PCWP 12-14 mmHg, RAP 12 mmHg.; Fick CO/CI 4.9/2.2  . SHOULDER ARTHROSCOPY Left 03/14/2019   Procedure: LEFT SHOULDER ARTHROSCOPY, DEBRIDEMENT, AND DECOMPRESSION;  Surgeon: Newt Minion, MD;  Location: [place];  Service: Orthopedics;  Laterality: Left;  . TOTAL KNEE ARTHROPLASTY Right 06/29/2017   Procedure: RIGHT TOTAL KNEE ARTHROPLASTY;  Surgeon: Newt Minion, MD;  Location: [place];  Service: Orthopedics;  Laterality: Right;  . TOTAL KNEE ARTHROPLASTY Left 12/07/2017  . TOTAL KNEE ARTHROPLASTY Left 12/07/2017   Procedure: LEFT TOTAL KNEE ARTHROPLASTY;  Surgeon: Newt Minion, MD;  Location: [place];  Service: Orthopedics;  Laterality: Left;  . TRACHEOSTOMY TUBE PLACEMENT N/A 09/20/2017   Procedure: AWAKE INTUBATION WITH ANESTHESIA WITH VIDEO ASSISTANCE;  Surgeon: Helayne Seminole, MD;  Location: [place] [facility];  Service: ENT;  Laterality: N/A;  . TRANSTHORACIC ECHOCARDIOGRAM  08/2014   Normal LV size and function.  Mild LVH.  EF 55-60%.  Normal regional wall motion.  GR 1 DD.  Normal RV size and function .  [name] TRANSTHORACIC ECHOCARDIOGRAM  08/12/2020    [facility]): Normal LV size and function.  EF 55 to 60%.  No RWM A.  Normal filling pattern.  Normal RV.  No significant valvular disease-stenosis  or regurgitation.  No vegetation.  . TUBAL LIGATION     with reversal in 1994  . VENTRAL HERNIA REPAIR      Family History  Problem Relation Age of Onset  . Diabetes Father   . Heart attack Father 45  . Coronary artery disease Father   . Heart failure Father   . Throat cancer Father   . Hypertension Father   . Heart disease Father   . Sleep apnea Father   . Obesity Father   . COPD Mother   . Emphysema Mother   . Asthma Mother   . Heart failure Mother   . Breast cancer Mother   . Diabetes Mother   . Kidney disease Mother   . Thyroid disease Mother   . Heart attack Maternal Grandfather   . Sarcoidosis Maternal Uncle   . Lung cancer Brother   . Diabetes Brother   . Colon cancer Neg Hx   . Colon polyps Neg Hx   . Esophageal cancer Neg Hx   . Rectal cancer Neg Hx   . Stomach cancer Neg Hx     Allergies  Allergen Reactions  . Methotrexate Other (See Comments)    Peri-oral and buccal lesions  . Vancomycin Other (See Comments)    DOSE RELATED NEPHROTOXICITY  . Lisinopril Cough  . Chlorhexidine Itching  . Clindamycin/Lincomycin Nausea And Vomiting and Rash  . Doxycycline Rash  . Teflaro [Ceftaroline] Rash and Other (See Comments)    Tolerates ceftriaxone     Current Medications:   Current Outpatient Medications:  .  albuterol (PROVENTIL HFA;VENTOLIN HFA) 108 (90 Base) MCG/ACT inhaler, Inhale 1-2 puffs into the lungs every 6 (six) hours as needed for wheezing or shortness of breath., Disp: 8 g, Rfl: 5 .  allopurinol (ZYLOPRIM) 100 MG tablet, Take 100 mg by mouth every evening. , Disp: , Rfl:  .  aspirin EC 81 MG tablet, Take 1 tablet (81 mg total) by mouth daily., Disp: 30 tablet, Rfl: 11 .  atorvastatin (LIPITOR) 40 MG tablet, TAKE 1 TABLET BY MOUTH EVERY DAY, Disp: 90 tablet, Rfl: 3 .  BREO ELLIPTA 200-25 MCG/INH AEPB, TAKE 1 PUFF BY MOUTH EVERY DAY, Disp: 60 each, Rfl: 5 .  buPROPion (WELLBUTRIN XL) 300 MG 24 hr tablet, TAKE 1 TABLET BY MOUTH EVERY DAY, Disp: 90  tablet, Rfl: 3 .  carvedilol (COREG) 12.5 MG tablet,  Take 12.5 mg by mouth 2 (two) times daily with a meal., Disp: , Rfl:  .  chlorpheniramine-HYDROcodone (TUSSIONEX PENNKINETIC ER) 10-8 MG/5ML SUER, Take 5 mLs by mouth at bedtime as needed for cough., Disp: 70 mL, Rfl: 0 .  Cholecalciferol 25 MCG (1000 UT) capsule, Take 1,000 Units by mouth in the morning and at bedtime., Disp: , Rfl:  .  DEXILANT 60 MG capsule, TAKE 1 CAPSULE BY MOUTH EVERY DAY, Disp: 90 capsule, Rfl: 3 .  diclofenac Sodium (VOLTAREN) 1 % GEL, Apply 1 application topically as needed (pain.). , Disp: , Rfl:  .  diltiazem (CARDIZEM CD) 120 MG 24 hr capsule, TAKE 1 CAPSULE BY MOUTH EVERY DAY, Disp: 90 capsule, Rfl: 0 .  Dulaglutide (TRULICITY) 3 KV/4.2VZ SOPN, Inject 0.5 mLs (3 mg total) into the skin once a week., Disp: 12 pen, Rfl: 3 .  famotidine (PEPCID) 20 MG tablet, Take 1 tablet (20 mg total) by mouth at bedtime., Disp: 30 tablet, Rfl: 3 .  fenofibrate 160 MG tablet, TAKE 1 TABLET BY MOUTH EVERY DAY, Disp: 30 tablet, Rfl: 2 .  fluticasone (FLONASE) 50 MCG/ACT nasal spray, Place 2 sprays into both nostrils daily as needed for allergies., Disp: 48 mL, Rfl: 3 .  furosemide (LASIX) 40 MG tablet, TAKE 1 TABLET BY MOUTH TWICE A DAY, Disp: 180 tablet, Rfl: 1 .  gabapentin (NEURONTIN) 100 MG capsule, TAKE 1 CAPSULE BY MOUTH THREE TIMES A DAY, Disp: 270 capsule, Rfl: 3 .  glucose blood (CONTOUR NEXT TEST) test strip, 1 each by Other route 4 (four) times daily. And lancets 4/day, Disp: 400 each, Rfl: 3 .  HYDROcodone-acetaminophen (NORCO/VICODIN) 5-325 MG tablet, Take 1 tablet by mouth every 6 (six) hours as needed., Disp: 15 tablet, Rfl: 0 .  insulin glargine (LANTUS SOLOSTAR) 100 UNIT/ML Solostar Pen, Inject 25 Units into the skin every morning., Disp: 30 mL, Rfl: 2 .  insulin lispro (HUMALOG KWIKPEN) 100 UNIT/ML KwikPen, Inject 0.15 mLs (15 Units total) into the skin daily with supper., Disp: 15 mL, Rfl: 29 .  Insulin Pen Needle (PEN  NEEDLES) 32G X 4 MM MISC, 1 each by Does not apply route 4 (four) times daily. E11.9, Disp: 400 each, Rfl: 0 .  ipratropium (ATROVENT) 0.03 % nasal spray, Place 2 sprays into both nostrils every 12 (twelve) hours., Disp: 30 mL, Rfl: 2 .  KLOR-CON M20 20 MEQ tablet, TAKE 1 & 1/2 TABLETS BY MOUTH TWICE A DAY, Disp: 270 tablet, Rfl: 2 .  lactulose (CHRONULAC) 10 GM/15ML solution, TAKE 15 MLS BY MOUTH DAILY AS NEEDED FOR CONSTIPATION., Disp: 1419 mL, Rfl: 1 .  Lancet Devices (EASY MINI EJECT LANCING DEVICE) MISC, Use as directed 4 times daily to test blood sugar, Disp: 1 each, Rfl: 3 .  linaclotide (LINZESS) 290 MCG CAPS capsule, Take by mouth as needed., Disp: , Rfl:  .  LORazepam (ATIVAN) 0.5 MG tablet, Take 0.5 mg by mouth every 12 (twelve) hours as needed for anxiety., Disp: , Rfl:  .  meloxicam (MOBIC) 15 MG tablet, Take 15 mg by mouth daily., Disp: , Rfl:  .  metFORMIN (GLUCOPHAGE) 1000 MG tablet, Take 1 tablet (1,000 mg total) by mouth 2 (two) times daily with a meal., Disp: 180 tablet, Rfl: 1 .  Microlet Lancets MISC, 1 each by Does not apply route 2 (two) times daily. E11.9, Disp: 100 each, Rfl: 2 .  naproxen (NAPROSYN) 500 MG tablet, Take 500 mg by mouth 2 (two) times daily., Disp: , Rfl:  .  nitroGLYCERIN (NITROSTAT) 0.4 MG SL tablet, Place 1 tablet (0.4 mg total) under the tongue every 5 (five) minutes as needed for chest pain. Reported on 01/05/2016, Disp: 25 tablet, Rfl: 6 .  ondansetron (ZOFRAN-ODT) 4 MG disintegrating tablet, Take 1 tablet (4 mg total) by mouth every 8 (eight) hours as needed for nausea or vomiting., Disp: 50 tablet, Rfl: 2 .  telmisartan (MICARDIS) 20 MG tablet, TAKE 1 TABLET BY MOUTH EVERY DAY, Disp: 90 tablet, Rfl: 1 .  tiZANidine (ZANAFLEX) 4 MG tablet, Take 0.5-1 tablets (2-4 mg total) by mouth every 8 (eight) hours as needed for muscle spasms., Disp: 30 tablet, Rfl: 1 .  venlafaxine XR (EFFEXOR-XR) 75 MG 24 hr capsule, TAKE 1 CAPSULE BY MOUTH TWICE A DAY, Disp: 180  capsule, Rfl: 3   Review of Systems:   ROS Negative unless otherwise specified per HPI.  Vitals:   Vitals:   12/19/20 1259  BP: 138/70  Pulse: 71  Temp: (!) 97.3 F (36.3 C)  TempSrc: Temporal  SpO2: 96%  Weight: 218 lb (98.9 kg)  Height: 5\' 6"  (1.676 m)     Body mass index is 35.19 kg/m.  Physical Exam:   Physical Exam Vitals and nursing note reviewed.  Constitutional:      General: She is not in acute distress.    Appearance: She is well-developed. She is not ill-appearing or toxic-appearing.  HENT:     Head: Normocephalic and atraumatic.     Right Ear: Ear canal and external ear normal. A middle ear effusion is present. Tympanic membrane is not erythematous, retracted or bulging.     Left Ear: Ear canal and external ear normal. A middle ear effusion is present. Tympanic membrane is not erythematous, retracted or bulging.     Nose: Nose normal.     Right Sinus: No maxillary sinus tenderness or frontal sinus tenderness.     Left Sinus: No maxillary sinus tenderness or frontal sinus tenderness.     Mouth/Throat:     Pharynx: Uvula midline. No posterior oropharyngeal erythema.  Eyes:     General: Lids are normal.     Conjunctiva/sclera: Conjunctivae normal.  Neck:     Trachea: Trachea normal.  Cardiovascular:     Rate and Rhythm: Normal rate and regular rhythm.     Pulses: Normal pulses.     Heart sounds: Normal heart sounds, S1 normal and S2 normal.     Comments: No LE edema Pulmonary:     Effort: Pulmonary effort is normal.     Breath sounds: Normal breath sounds. No decreased breath sounds, wheezing, rhonchi or rales.  Lymphadenopathy:     Cervical: No cervical adenopathy.  Skin:    General: Skin is warm and dry.  Neurological:     Mental Status: She is alert.     GCS: GCS eye subscore is 4. GCS verbal subscore is 5. GCS motor subscore is 6.  Psychiatric:        Speech: Speech normal.        Behavior: Behavior normal. Behavior is cooperative.     Results for orders placed or performed in visit on 12/19/20  POCT Urinalysis Dipstick (Automated)  Result Value Ref Range   Color, UA yellow    Clarity, UA clear    Glucose, UA Negative Negative   Bilirubin, UA neg    Ketones, UA neg    Spec Grav, UA 1.015 1.010 - 1.025   Blood, UA neg    pH, UA 6.5 5.0 -  8.0   Protein, UA Negative Negative   Urobilinogen, UA 0.2 0.2 or 1.0 E.U./dL   Nitrite, UA neg    Leukocytes, UA Negative Negative   *Note: Due to a large number of results and/or encounters for the requested time period, some results have not been displayed. A complete set of results can be found in Results Review.     Assessment and Plan:   Betheny was seen today for fatigue.  Diagnoses and all orders for this visit:  Fatigue, unspecified type Unclear etiology. Vitals are stable and exam normal without any red flags. Will update blood work today. Recommend rest and hydration over the weekend. I discussed with her that if any new symptoms come up in the interim that she should go to the urgent care or ER depending on severity of symptoms. We will treat abnormal labs as indicated. -     CBC with Differential/Platelet -     Comprehensive metabolic panel -     Ferritin -     B12 and Folate Panel -     TSH -     CK (Creatine Kinase)  Urinary frequency Urinalysis is negative. She does have a history of relatively asymptomatic UTIs, I will go ahead and send this off for culture.  We will treat with antibiotics as indicated. -     POCT Urinalysis Dipstick (Automated)  Nasal congestion  We will have her switch out her Flonase to and nasal saline spray followed by Atrovent nasal spray. Follow-up next week if any worsening symptoms.  Other orders -     ipratropium (ATROVENT) 0.03 % nasal spray; Place 2 sprays into both nostrils every 12 (twelve) hours.  CMA or LPN served as scribe during this visit. History, Physical, and Plan performed by medical provider. The above  documentation has been reviewed and is accurate and complete.  Inda Coke, PA-C

## 2020-12-21 LAB — URINE CULTURE
MICRO NUMBER:: 11637690
SPECIMEN QUALITY:: ADEQUATE

## 2020-12-21 IMAGING — CT CT ABD-PELV W/ CM
2 of 5 series · 16 of 46 positions shown, 18 images · IV contrast (Omnipaque)
Comparison: CT 02/09/2018. Ultrasound 01/31/2020.

CLINICAL DATA: Upper abdominal pain radiating into the back.
History of congestive heart failure and sarcoidosis. Abdominal
abscess/infection suspected.

EXAM:
CT ABDOMEN AND PELVIS WITH CONTRAST
TECHNIQUE: Multidetector CT imaging of the abdomen and pelvis was performed
using the standard protocol following bolus administration of
intravenous contrast.
CONTRAST:  100mL OMNIPAQUE IOHEXOL 300 MG/ML  SOLN

[Series 2: axial st · axial · 0.98mm/px · z∈[-607,-107]mm · 13 of 112 slices shown, 15 images]
[im 6/112  soft-tissue]
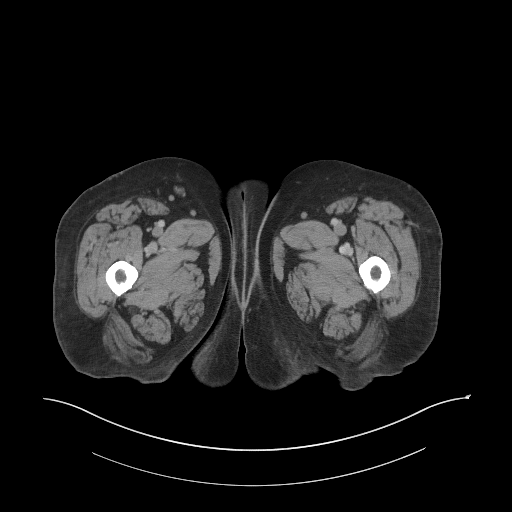
[im 6/112  bone]
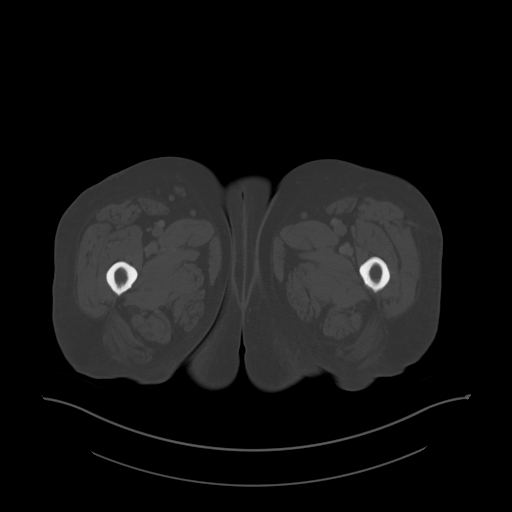
[im 18/112  soft-tissue]
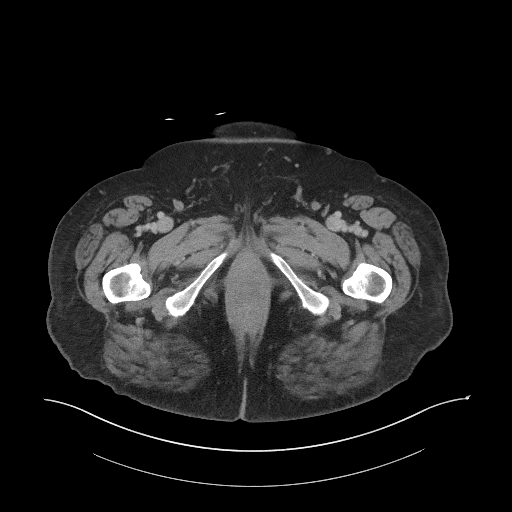
[im 24/112  soft-tissue]
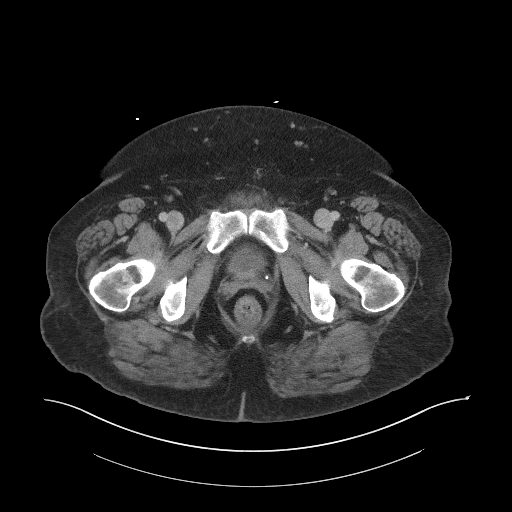
[im 30/112  soft-tissue]
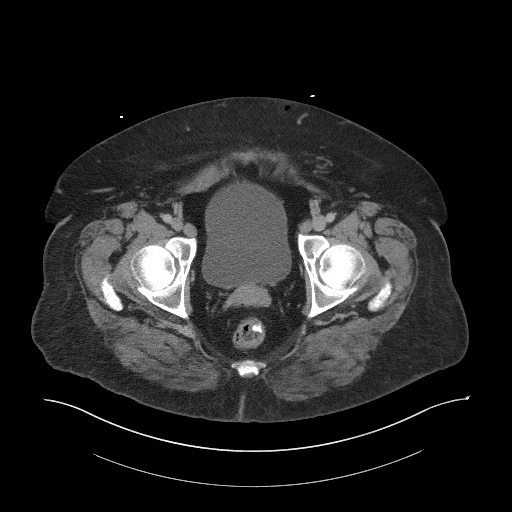
[im 41/112  soft-tissue]
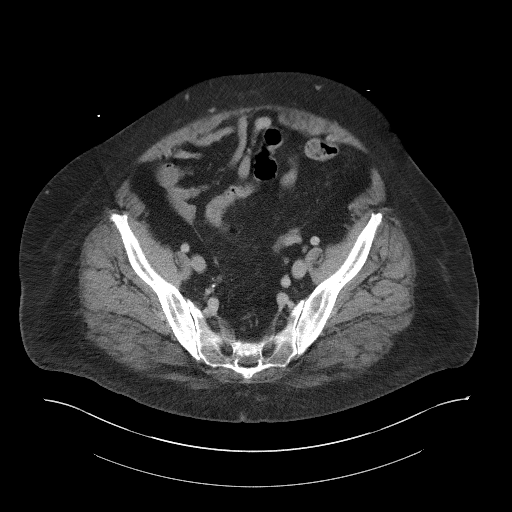
[im 47/112  soft-tissue]
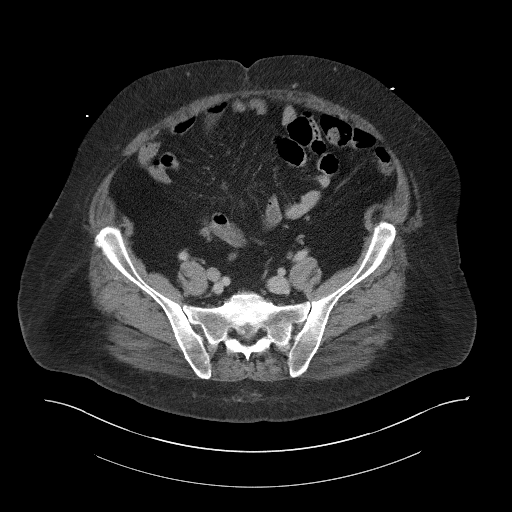
[im 59/112  soft-tissue]
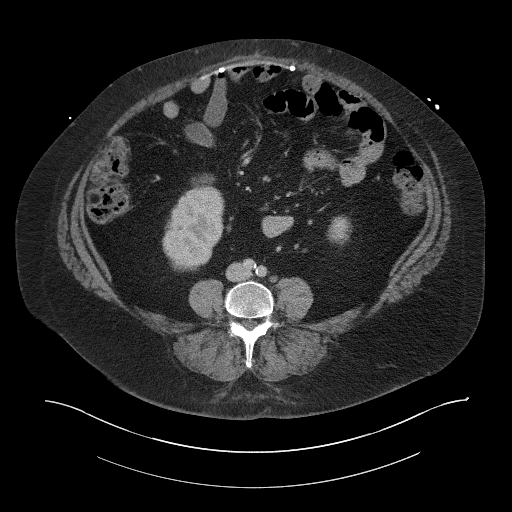
[im 65/112  soft-tissue]
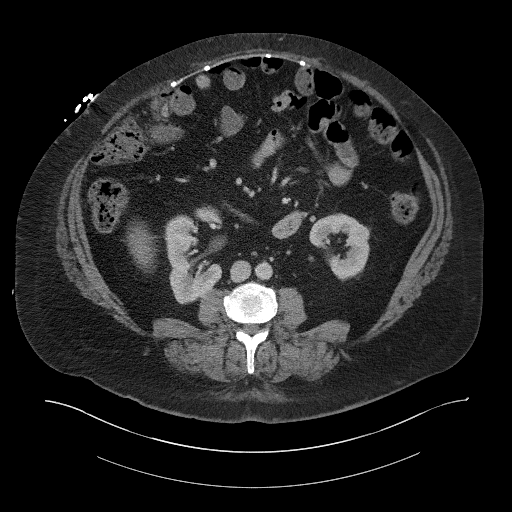
[im 71/112  soft-tissue]
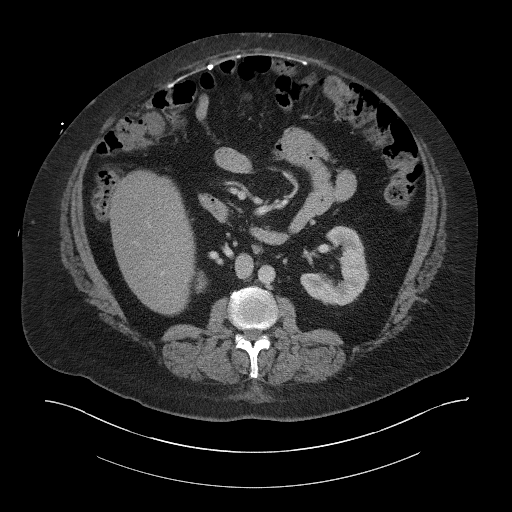
[im 71/112  bone]
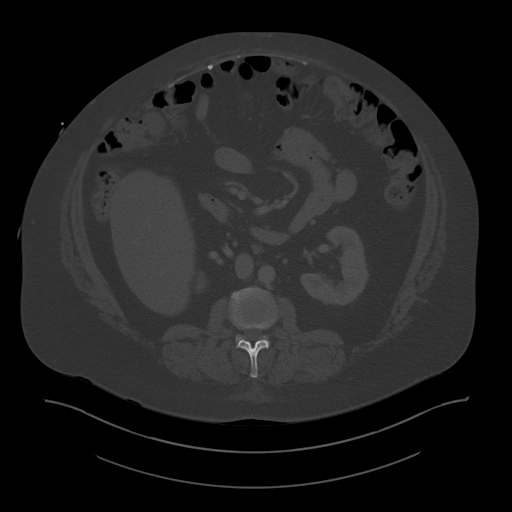
[im 82/112  soft-tissue]
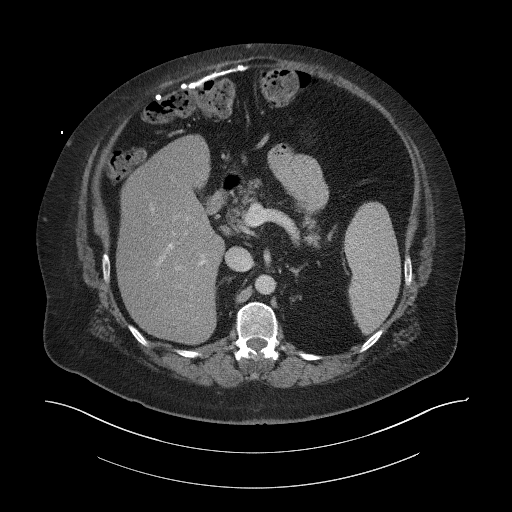
[im 88/112  soft-tissue]
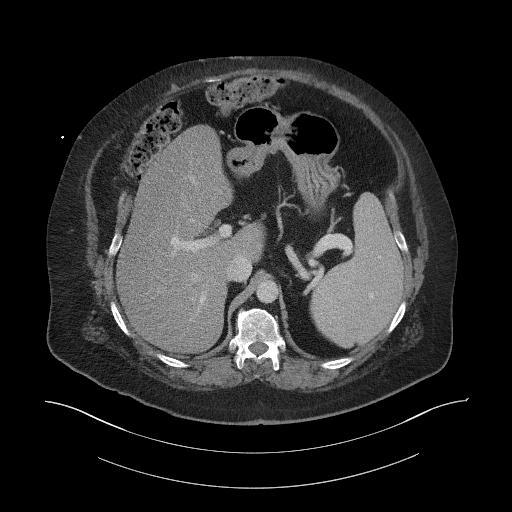
[im 94/112  soft-tissue]
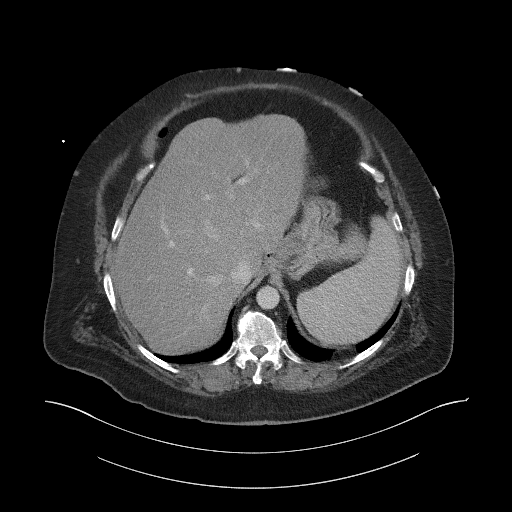
[im 106/112  soft-tissue]
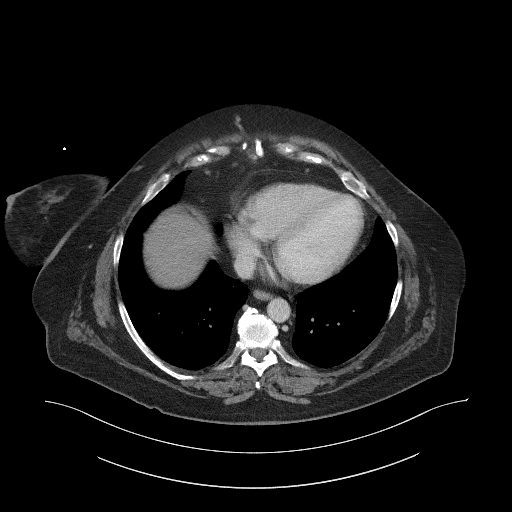

[Series 5: coronal st · coronal · 0.99mm/px · 3 of 137 slices shown]
[im 46/137  soft-tissue]
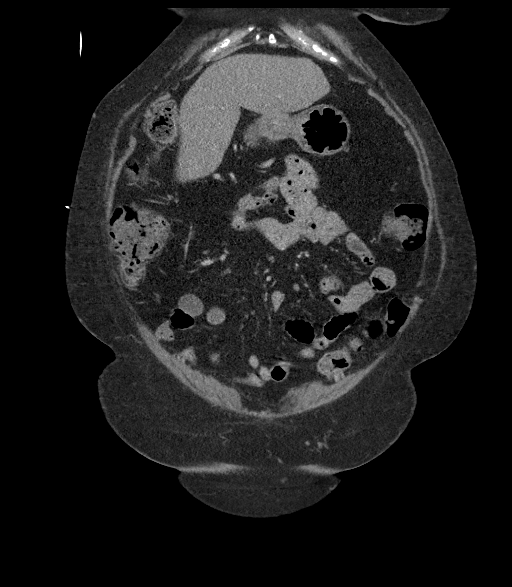
[im 61/137  soft-tissue]
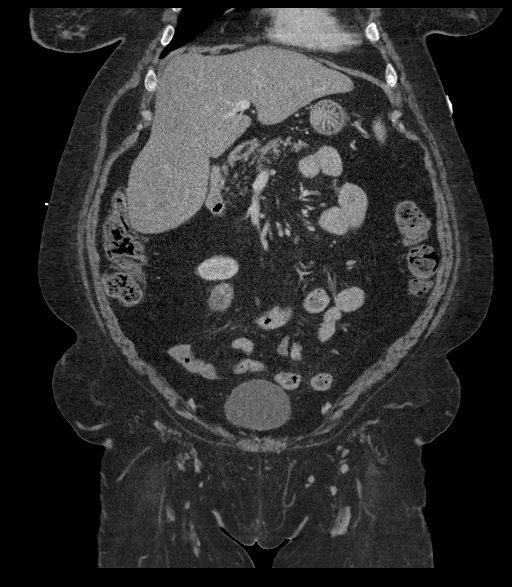
[im 76/137  soft-tissue]
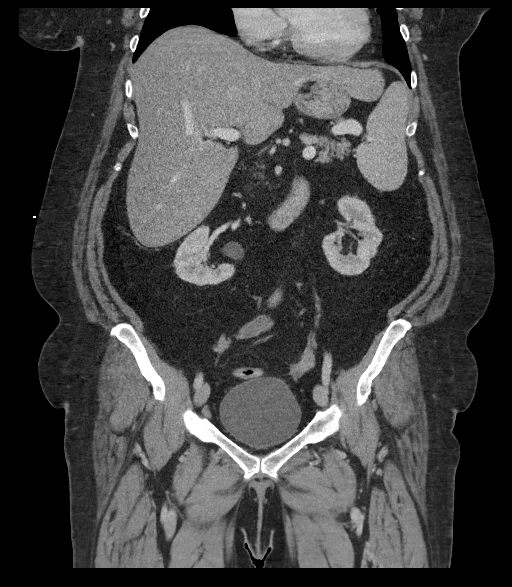

[16 of 46 positions shown; findings below may reference images not displayed]

FINDINGS: Lower chest: Clear lung bases. No significant pleural or pericardial
effusion.

Hepatobiliary: The density of the liver is within normal limits, and
no focal hepatic abnormalities are identified. No significant
biliary dilatation post cholecystectomy.

Pancreas: Fatty replaced. No focal abnormality, surrounding
inflammation or ductal dilatation.

Spleen: Normal in size without focal abnormality.

Adrenals/Urinary Tract: Both adrenal glands appear normal. The
kidneys appear normal without evidence of urinary tract calculus,
suspicious lesion or hydronephrosis. No bladder abnormalities are
seen.

Stomach/Bowel: No evidence of bowel wall thickening, distention or
surrounding inflammatory change. There are postsurgical changes in
the gastric cardia. The appendix is surgically absent.

Vascular/Lymphatic: There are no enlarged abdominal or pelvic lymph
nodes. Aortic and branch vessel atherosclerosis. No acute vascular
findings. The portal, superior mesenteric and splenic veins are
patent.

Reproductive: Hysterectomy. No adnexal mass.

Other: Stable postsurgical changes from previous umbilical hernia
repair. No recurrent hernia. No ascites or focal extraluminal fluid
collection.

Musculoskeletal: No acute or significant osseous findings.
IMPRESSION: 1. No acute findings or explanation for the patient's symptoms. No
evidence of abdominal abscess or infection.
2. Postsurgical changes as described.
3.  Aortic Atherosclerosis ([id number].L).

## 2020-12-21 IMAGING — CR DG CHEST 2V
2 series · 2 of 2 positions shown · non-contrast
Comparison: Prior chest radiographs 08/23/2018 and earlier.

CLINICAL DATA: Chest pain. Chest heaviness with radiation into
back. History of hypertension, diabetes, COPD, CHF and sarcoidosis.

EXAM:
CHEST - 2 VIEW

[w chest pa]
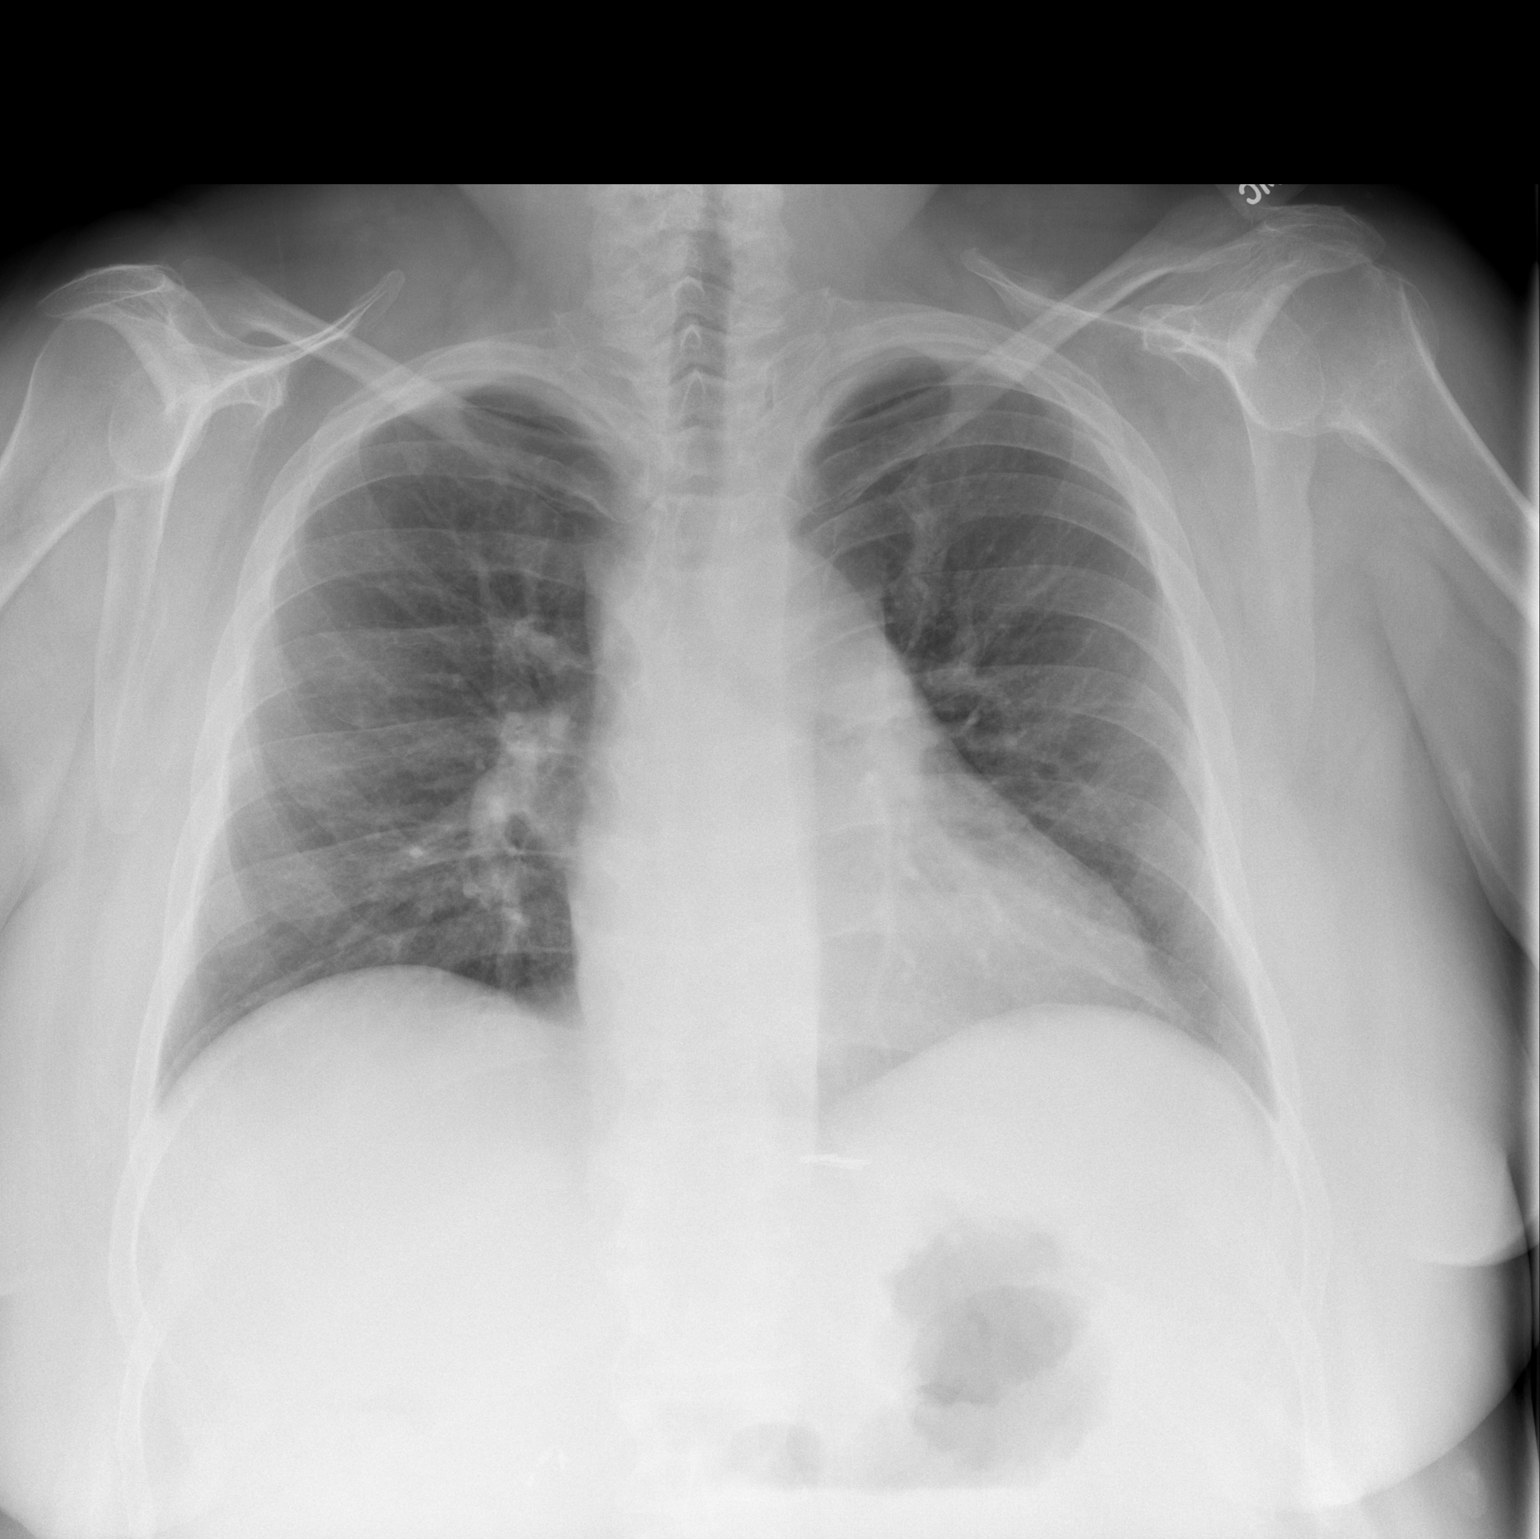

[w chest lat]
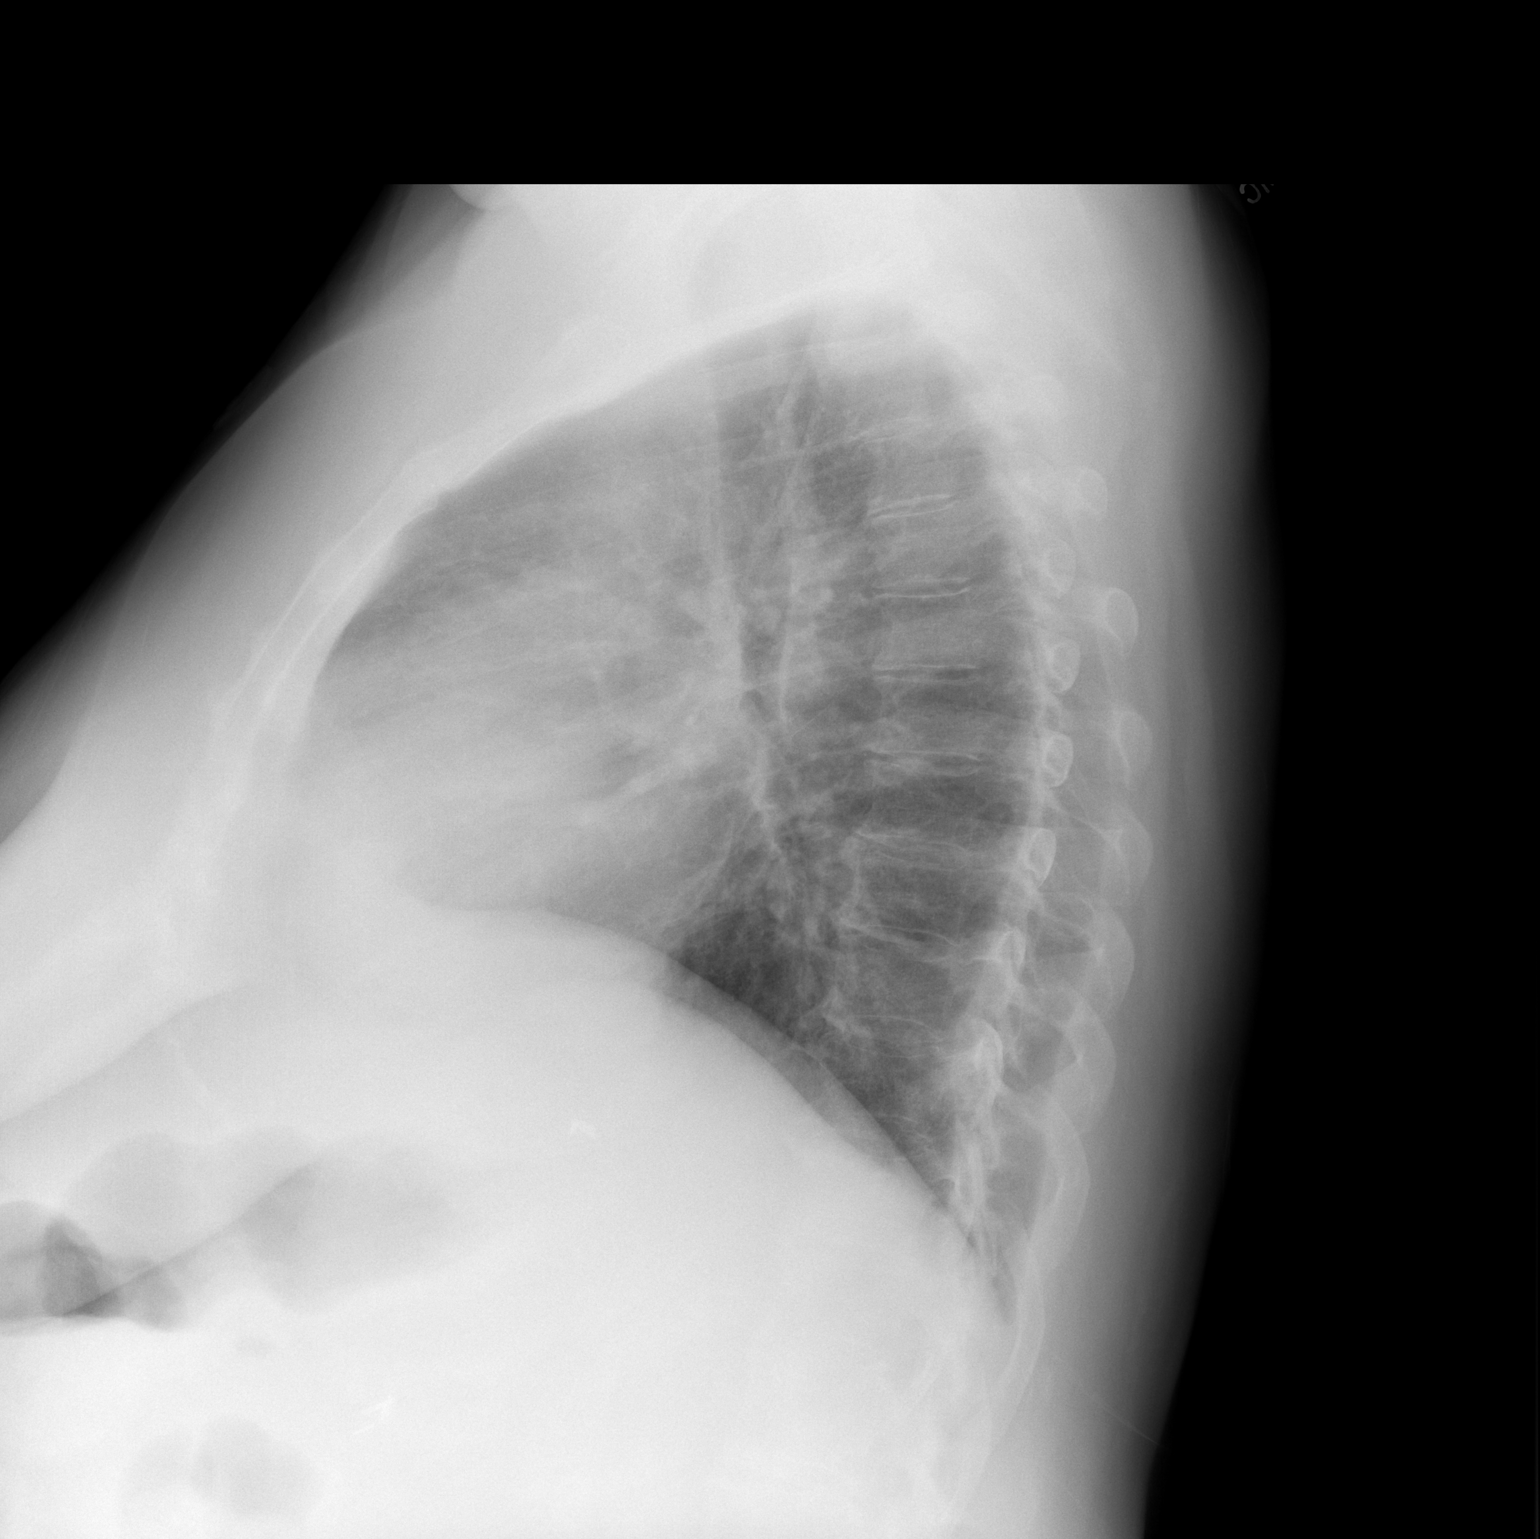

[2 of 2 positions shown; findings below may reference images not displayed]

FINDINGS: Heart size within normal limits. No appreciable airspace
consolidation. No evidence of pleural effusion or pneumothorax. No
acute bony abnormality is identified. Surgical clips project in the
region of the GE junction.
IMPRESSION: No evidence of acute cardiopulmonary abnormality.

## 2020-12-22 ENCOUNTER — Other Ambulatory Visit: Payer: Self-pay | Admitting: *Deleted

## 2020-12-22 ENCOUNTER — Telehealth: Payer: Self-pay

## 2020-12-22 ENCOUNTER — Other Ambulatory Visit: Payer: Self-pay | Admitting: Physician Assistant

## 2020-12-22 DIAGNOSIS — R5383 Other fatigue: Secondary | ICD-10-CM

## 2020-12-22 MED ORDER — AMOXICILLIN-POT CLAVULANATE 875-125 MG PO TABS: 1.0000 | ORAL_TABLET | Freq: Two times a day (BID) | ORAL | 0 refills | Status: DC

## 2020-12-22 NOTE — Telephone Encounter (Signed)
See result notes. 

## 2020-12-22 NOTE — Telephone Encounter (Signed)
Patient is calling in wondering if someone is able to give her a call about lab results, states she didn't understand the message.

## 2020-12-23 ENCOUNTER — Other Ambulatory Visit: Payer: Self-pay

## 2020-12-23 ENCOUNTER — Ambulatory Visit (AMBULATORY_SURGERY_CENTER): Payer: 59 | Admitting: *Deleted

## 2020-12-23 VITALS — Ht 66.0 in | Wt 218.0 lb

## 2020-12-23 DIAGNOSIS — Z1211 Encounter for screening for malignant neoplasm of colon: Secondary | ICD-10-CM

## 2020-12-23 NOTE — Progress Notes (Signed)
Pt's previsit is done over the phone and all paperwork (prep instructions, blank consent form to just read over, pre-procedure acknowledgement form and stamped envelope) sent to patient  covid vaccines x2 Last dose of nitro SL- 5 years ago   No trouble with anesthesia, denies being told they were difficult to intubate, or hx/fam hx of malignant hyperthermia per pt  Pt told to take Linzess daily to procedure  Pt has sutab at home   No egg or soy allergy  No home oxygen use   No medications for weight loss taken

## 2020-12-24 ENCOUNTER — Ambulatory Visit (INDEPENDENT_AMBULATORY_CARE_PROVIDER_SITE_OTHER): Payer: 59

## 2020-12-24 ENCOUNTER — Ambulatory Visit (INDEPENDENT_AMBULATORY_CARE_PROVIDER_SITE_OTHER): Payer: 59 | Admitting: Physical Therapy

## 2020-12-24 ENCOUNTER — Encounter: Payer: Self-pay | Admitting: Physical Therapy

## 2020-12-24 ENCOUNTER — Other Ambulatory Visit (INDEPENDENT_AMBULATORY_CARE_PROVIDER_SITE_OTHER): Payer: 59

## 2020-12-24 DIAGNOSIS — R5383 Other fatigue: Secondary | ICD-10-CM

## 2020-12-24 DIAGNOSIS — M6281 Muscle weakness (generalized): Secondary | ICD-10-CM

## 2020-12-24 DIAGNOSIS — M79605 Pain in left leg: Secondary | ICD-10-CM | POA: Diagnosis not present

## 2020-12-24 DIAGNOSIS — E538 Deficiency of other specified B group vitamins: Secondary | ICD-10-CM | POA: Diagnosis not present

## 2020-12-24 DIAGNOSIS — M25662 Stiffness of left knee, not elsewhere classified: Secondary | ICD-10-CM | POA: Diagnosis not present

## 2020-12-24 DIAGNOSIS — R531 Weakness: Secondary | ICD-10-CM | POA: Diagnosis not present

## 2020-12-24 LAB — COMPREHENSIVE METABOLIC PANEL
ALT: 21 U/L (ref 0–35)
AST: 18 U/L (ref 0–37)
Albumin: 4 g/dL (ref 3.5–5.2)
Alkaline Phosphatase: 95 U/L (ref 39–117)
BUN: 28 mg/dL — ABNORMAL HIGH (ref 6–23)
CO2: 29 mEq/L (ref 19–32)
Calcium: 9 mg/dL (ref 8.4–10.5)
Chloride: 99 mEq/L (ref 96–112)
Creatinine, Ser: 1.25 mg/dL — ABNORMAL HIGH (ref 0.40–1.20)
GFR: 46.56 mL/min — ABNORMAL LOW (ref >60.00)
Glucose, Bld: 124 mg/dL — ABNORMAL HIGH (ref 70–99)
Potassium: 4.9 mEq/L (ref 3.5–5.1)
Sodium: 135 mEq/L (ref 135–145)
Total Bilirubin: 0.6 mg/dL (ref 0.2–1.2)
Total Protein: 6.5 g/dL (ref 6.0–8.3)

## 2020-12-24 LAB — BASIC METABOLIC PANEL
BUN: 28 mg/dL — ABNORMAL HIGH (ref 6–23)
CO2: 29 mEq/L (ref 19–32)
Calcium: 9 mg/dL (ref 8.4–10.5)
Chloride: 99 mEq/L (ref 96–112)
Creatinine, Ser: 1.25 mg/dL — ABNORMAL HIGH (ref 0.40–1.20)
GFR: 46.56 mL/min — ABNORMAL LOW (ref >60.00)
Glucose, Bld: 124 mg/dL — ABNORMAL HIGH (ref 70–99)
Potassium: 4.9 mEq/L (ref 3.5–5.1)
Sodium: 135 mEq/L (ref 135–145)

## 2020-12-24 LAB — CBC WITH DIFFERENTIAL/PLATELET
Basophils Absolute: 0 10*3/uL (ref 0.0–0.1)
Basophils Relative: 0.5 % (ref 0.0–3.0)
Eosinophils Absolute: 0.2 10*3/uL (ref 0.0–0.7)
Eosinophils Relative: 2.3 % (ref 0.0–5.0)
HCT: 31.4 % — ABNORMAL LOW (ref 36.0–46.0)
Hemoglobin: 10.6 g/dL — ABNORMAL LOW (ref 12.0–15.0)
Lymphocytes Relative: 25.7 % (ref 12.0–46.0)
Lymphs Abs: 2.4 10*3/uL (ref 0.7–4.0)
MCHC: 33.7 g/dL (ref 30.0–36.0)
MCV: 85 fl (ref 78.0–100.0)
Monocytes Absolute: 0.7 10*3/uL (ref 0.1–1.0)
Monocytes Relative: 7.3 % (ref 3.0–12.0)
Neutro Abs: 6 10*3/uL (ref 1.4–7.7)
Neutrophils Relative %: 64.2 % (ref 43.0–77.0)
Platelets: 333 10*3/uL (ref 150.0–400.0)
RBC: 3.7 Mil/uL — ABNORMAL LOW (ref 3.87–5.11)
RDW: 15.3 % (ref 11.5–15.5)
WBC: 9.4 10*3/uL (ref 4.0–10.5)

## 2020-12-24 MED ORDER — CYANOCOBALAMIN 1000 MCG/ML IJ SOLN
1000.0000 ug | Freq: Once | INTRAMUSCULAR | Status: AC
  Administered 2020-12-24: 1000 ug via INTRAMUSCULAR

## 2020-12-24 NOTE — Progress Notes (Signed)
[name] 60 yo female came in the office today for a B12 Injection Per Garret Reddish. Administered CYANOCOBALAMIN 1,000 MCG IM in her left deltoid. Patient tolerated the injection very well.

## 2020-12-24 NOTE — Progress Notes (Signed)
I have reviewed and agree with note, evaluation, plan.   Sibley Rolison, MD  

## 2020-12-24 NOTE — Patient Instructions (Signed)
Access Code: [email or web address] URL: [email or web address] Date: 12/24/2020 Prepared by: Lyndee Hensen  Exercises Supine Heel Slide - 2 x daily - 2 sets - 10 reps Straight Leg Raise - 1 x daily - 2 sets - 10 reps Sidelying Hip Abduction - 1 x daily - 2 sets - 10 reps Step Up - 1 x daily - 1 sets - 10 reps Standing Hip Abduction with Counter Support - 1 x daily - 1-2 sets - 10 reps Seated Knee Extension AROM - 1 x daily - 2 sets - 10 reps Side Stepping with Counter Support - 1 x daily - 2 sets - 10 reps Walking March - 1 x daily - 2 sets - 10 reps Standing Tandem Balance with Counter Support - 2 x daily - 3 reps - 30 hold

## 2020-12-24 NOTE — Therapy (Signed)
[place], [name], [address] Phone: [phone]   Fax:  [phone]  Physical Therapy Treatment/Re-cert   Patient Details  Name: [name] MRN: [id number] Date of Birth: April 07, 1961 Referring Provider (PT): Garret Reddish   Encounter Date: 12/24/2020   PT End of Session - 12/24/20 1452    Visit Number 9    Number of Visits 12    Date for PT Re-Evaluation 01/21/21    Authorization Type U[employer]    PT Start Time 1430    PT Stop Time 1515    PT Time Calculation (min) 45 min    Activity Tolerance Patient tolerated treatment well    Behavior During Therapy [facility] for tasks assessed/performed           Past Medical History:  Diagnosis Date  . Abnormal SPEP 04/17/2014  . Acute left ankle pain 01/26/2017  . ANEMIA-UNSPECIFIED 09/18/2009  . Anxiety   . Arthritis   . Blood transfusion without reported diagnosis   . Cataract   . CHF (congestive heart failure) ([place])   . Chronic diastolic heart failure, NYHA class 2 (HCC)    Normal LVEDP by May 2018  . COPD (chronic obstructive pulmonary disease) ([place])   . Depression   . DIABETES MELLITUS, TYPE II 08/21/2006  . Diabetic osteomyelitis ([place]) 05/29/2015  . Fatty liver   . Fracture of 5th metatarsal    non union  . GERD 07/31/1172   Had fundoplication  . GOUT 08/20/2010  . Hammer toe of right foot    2-5th toes  . Hx of umbilical hernia repair   . HYPERLIPIDEMIA 08/21/2006  . HYPERTENSION 08/21/2006  . Infection of wound due to methicillin resistant Staphylococcus aureus (MRSA)   . Internal hemorrhoids   . Kidney problem   . Multiple allergies 10/14/2016  . OBESITY 06/04/2009  . Onychomycosis 10/27/2015  . Osteomyelitis of left foot ([place]) 05/29/2015  . Osteoporosis   . Pulmonary sarcoidosis ([place])    Followed locally by pulmonology, but also by Dr. Casper Harrison at [facility]  . Right knee pain 01/26/2017  . Vocal cord dysfunction   . Wears partial  dentures     Past Surgical History:  Procedure Laterality Date  . ABDOMINAL HYSTERECTOMY    . APPENDECTOMY    . BLADDER SUSPENSION  11/11/2011   Procedure: TRANSVAGINAL TAPE (TVT) PROCEDURE;  Surgeon: Olga Millers, MD;  Location: [facility];  Service: Gynecology;  Laterality: N/A;  . CAROTIDS  02/18/11   CAROTID DUPLEX; VERTEBRALS ARE PATENT WITH ANTEGRADE FLOW. ICA/CCA RATIO 1.61 ON RIGHT AND 0.75 ON LEFT  . CATARACT EXTRACTION, BILATERAL    . CHOLECYSTECTOMY  1984  . COLONOSCOPY  04/29/2010   [name]  . CYSTOSCOPY  11/11/2011   Procedure: CYSTOSCOPY;  Surgeon: Olga Millers, MD;  Location: [facility];  Service: Gynecology;  Laterality: N/A;  . EXTUBATION (ENDOTRACHEAL) IN OR N/A 09/23/2017   Procedure: EXTUBATION (ENDOTRACHEAL) IN OR;  Surgeon: Helayne Seminole, MD;  Location: [place];  Service: ENT;  Laterality: N/A;  . FIBEROPTIC LARYNGOSCOPY AND TRACHEOSCOPY N/A 09/23/2017   Procedure: FLEXIBLE FIBEROPTIC LARYNGOSCOPY;  Surgeon: Helayne Seminole, MD;  Location: [place];  Service: ENT;  Laterality: N/A;  . FRACTURE SURGERY     foot  . HALLUX FUSION Left 10/02/2018   Procedure: HALLUX ARTHRODESIS;  Surgeon: Edrick Kins, DPM;  Location: [facility];  Service: Podiatry;  Laterality: Left;  . HAMMER TOE SURGERY Left 10/02/2018  Procedure: HAMMER TOE CORRECTION 2ND 3RD 4RD FIFTH TOE;  Surgeon: Edrick Kins, DPM;  Location: [facility];  Service: Podiatry;  Laterality: Left;  . HAMMER TOE SURGERY Right 04/12/2019   Procedure: HAMMER TOE CORRECTION, 2ND, 3RD, 4TH AND 5TH TOES OF RIGHT FOOT;  Surgeon: Edrick Kins, DPM;  Location: [facility];  Service: Podiatry;  Laterality: Right;  . HAMMER TOE SURGERY Left 10/25/2019   Procedure: HAMMER TOE REPAIR SECOND, THIRD, FOUTH;  Surgeon: Edrick Kins, DPM;  Location: [place] [facility];  Service: Podiatry;  Laterality: Left;  . HERNIA REPAIR    . I & D EXTREMITY Left 06/27/2015   Procedure: Partial Excision Left Calcaneus, Place Antibiotic Beads, and Wound VAC;   Surgeon: Newt Minion, MD;  Location: [place];  Service: Orthopedics;  Laterality: Left;  . JOINT REPLACEMENT    . KNEE ARTHROSCOPY     right  . LEFT AND RIGHT HEART CATHETERIZATION WITH CORONARY ANGIOGRAM N/A 04/23/2013   Procedure: LEFT AND RIGHT HEART CATHETERIZATION WITH CORONARY ANGIOGRAM;  Surgeon: Leonie Man, MD;  Location: [facility];  Service: Cardiovascular;  Laterality: N/A;  . LEFT HEART CATH AND CORONARY ANGIOGRAPHY N/A 03/11/2017   Procedure: Left Heart Cath and Coronary Angiography;  Surgeon: Sherren Mocha, MD;  Location: [place] CV LAB; angiographically minimal CAD in the LAD otherwise normal.;  Normal LVEDP.  FALSE POSITIVE MYOVIEW  . LEFT HEART CATH AND CORONARY ANGIOGRAPHY  07/20/2010   LVEF 50-55% WITH VERY MILD GLOBAL HYPOKINESIA; ESSENTIALLY NORMAL CORONARY ARTERIES; NORMAL LV FUNCTION  . METATARSAL OSTEOTOMY WITH OPEN REDUCTION INTERNAL FIXATION (ORIF) METATARSAL WITH FUSION Left 04/09/2014   Procedure: LEFT FOOT FRACTURE OPEN TREATMENT METATARSAL INCLUDES INTERNAL FIXATION EACH;  Surgeon: Lorn Junes, MD;  Location: [place];  Service: Orthopedics;  Laterality: Left;  . NISSEN FUNDOPLICATION  3762  . NM MYOVIEW LTD --> FALSE POSITIVE  03/10/2017   : Moderate size "stress-induced "perfusion defect at the apex as well as "ill-defined stress-induced perfusion defect in the lateral wall.  EF 55%.  INTERMEDIATE risk. -->  FALSE POSITIVE  . ORIF DISTAL FEMUR FRACTURE  08/08/2020   [facility]: ORIF of left extra-articular periprosthetic distal femur fracture with synthesis of the femur plate with cortical nonlocking screws.  (Thought to be pathologic fracture due to osteoporosis) -> after a fall  . ORIF FEMUR W/ PERI-IMPLANT  08/29/2020   [facility]) revision of left femur fracture ORIF plate fixation screws; culture positive for MSSA  . Right and left CARDIAC CATHETERIZATION  04/23/2013   Angiographic normal coronaries; LVEDP  20 mmHg, PCWP 12-14 mmHg, RAP 12 mmHg.; Fick CO/CI 4.9/2.2  . SHOULDER ARTHROSCOPY Left 03/14/2019   Procedure: LEFT SHOULDER ARTHROSCOPY, DEBRIDEMENT, AND DECOMPRESSION;  Surgeon: Newt Minion, MD;  Location: [place];  Service: Orthopedics;  Laterality: Left;  . TOTAL KNEE ARTHROPLASTY Right 06/29/2017   Procedure: RIGHT TOTAL KNEE ARTHROPLASTY;  Surgeon: Newt Minion, MD;  Location: [place];  Service: Orthopedics;  Laterality: Right;  . TOTAL KNEE ARTHROPLASTY Left 12/07/2017  . TOTAL KNEE ARTHROPLASTY Left 12/07/2017   Procedure: LEFT TOTAL KNEE ARTHROPLASTY;  Surgeon: Newt Minion, MD;  Location: [place];  Service: Orthopedics;  Laterality: Left;  . TRACHEOSTOMY TUBE PLACEMENT N/A 09/20/2017   Procedure: AWAKE INTUBATION WITH ANESTHESIA WITH VIDEO ASSISTANCE;  Surgeon: Helayne Seminole, MD;  Location: [place] [facility];  Service: ENT;  Laterality: N/A;  . TRANSTHORACIC ECHOCARDIOGRAM  08/2014   Normal LV size  and function.  Mild LVH.  EF 55-60%.  Normal regional wall motion.  GR 1 DD.  Normal RV size and function .  [name] TRANSTHORACIC ECHOCARDIOGRAM  08/12/2020    [facility]): Normal LV size and function.  EF 55 to 60%.  No RWM A.  Normal filling pattern.  Normal RV.  No significant valvular disease-stenosis or regurgitation.  No vegetation.  . TUBAL LIGATION     with reversal in 1994  . UPPER GASTROINTESTINAL ENDOSCOPY    . VENTRAL HERNIA REPAIR      There were no vitals filed for this visit.   Subjective Assessment - 12/24/20 1451    Subjective Pt states doing very well with leg. Thinks she is back to doing everything she needs to. Has been doing HEP. Does report not feeling well last week, reports low hemaglobin and b12, had more lab testing today. Denies dizziness/lightheadedness or other symptoms today.    Currently in Pain? No/denies    Pain Score 0-No pain              [facility] PT Assessment - 12/24/20 0001      AROM   Left Knee Flexion 110                          [facility] Adult PT Treatment/Exercise - 12/24/20 0001      Exercises   Exercises Knee/Hip      Knee/Hip Exercises: Standing   Hip Flexion 20 reps;Knee bent    Hip Abduction 10 reps;Both;2 sets    Lateral Step Up 10 reps;Hand Hold: 1;Step Height: 6"    Forward Step Up 10 reps;Hand Hold: 1;Step Height: 6"    Functional Squat --    Other Standing Knee Exercises walk/march 10 ft x 4;    Other Standing Knee Exercises tandem stance 30 sec x 3 bil;      Knee/Hip Exercises: Seated   Long Arc Quad 20 reps    Long Arc Quad Weight 3 lbs.      Knee/Hip Exercises: Supine   Bridges 2 sets;10 reps    Bridges with Clamshell --    Straight Leg Raises 2 sets;10 reps;Both      Knee/Hip Exercises: Sidelying   Hip ABduction 20 reps      Manual Therapy   Manual Therapy Soft tissue mobilization;Passive ROM    Passive ROM --                  PT Education - 12/24/20 1452    Education Details Reviewed HEP    Person(s) Educated Patient    Methods Explanation;Demonstration;Tactile cues;Verbal cues    Comprehension Verbalized understanding;Returned demonstration;Verbal cues required;Tactile cues required            PT Short Term Goals - 12/24/20 2059      PT SHORT TERM GOAL #1   Title Pt to be independent with initial HEP    Time 2    Period Weeks    Status Achieved    Target Date 11/17/20      PT SHORT TERM GOAL #2   Title Pt to demo improved ROM of L knee by at 10 degrees    Baseline ROM 110 deg;    Time 4    Period Weeks    Status Partially Met    Target Date 11/17/20             PT Long Term Goals - 12/24/20 2100  PT LONG TERM GOAL #1   Title Pt to be independent with HEP    Time 6    Period Weeks    Status Partially Met    Target Date 01/21/21      PT LONG TERM GOAL #2   Title Pt to demo L knee ROM to be at least 115 deg    Time 6    Period Weeks    Status On-going    Target Date 01/21/21      PT LONG TERM GOAL #3    Title Pt to demo L quad at hip strength to be at least 4+/5 to improve stability and gait    Time 6    Period Weeks    Status Partially Met    Target Date 01/21/21      PT LONG TERM GOAL #4   Title Pt to demo stability and [employer] of L LE to be WNL with gait, dynamic balance and single leg stance    Time 6    Period Weeks    Status Partially Met    Target Date 01/21/21                 Plan - 12/24/20 2102    Clinical Impression Statement Pt progressing well towards LTGs. She has improved ROM for knee flexion, up to 110 degrees, but has not continued to improve beyond that. Pt with improved strength of quad and hip on L. She does have mild weakness in L hip and mild instaiblity of LE, seen mostly with single leg activitiy. .She has mild gait deficit with L lateral trunk lean, partially due to leg length discrepancy as well as hip weakness.  HEP reviewed today Pt to benefit from continued care at decreased frequency. Pt will continue Strengthening with HEP and return in 1-2 weeks, for f/u.    Personal Factors and Comorbidities Fitness;Comorbidity 1    Comorbidities surgery with complications, L TKA already present    Examination-Activity Limitations Stand;Stairs;Squat;Locomotion Level    Examination-Participation Restrictions Cleaning;Community Activity;Shop    Stability/Clinical Decision Making Evolving/Moderate complexity    Rehab Potential Good    PT Frequency 2x / week    PT Duration 6 weeks    PT Treatment/Interventions ADLs/Self Care Home Management;Cryotherapy;Electrical Stimulation;DME Instruction;Ultrasound;Moist Heat;Iontophoresis 102m/ml Dexamethasone;Gait training;Stair training;Functional mobility training;Therapeutic activities;Therapeutic exercise;Balance training;Patient/family education;Neuromuscular re-education;Manual techniques;Passive range of motion;Dry needling;Taping;Vasopneumatic Device;Spinal Manipulations;Joint Manipulations    PT Next Visit Plan review squatting,  bridge with band, review step up, TKE    Consulted and Agree with Plan of Care Patient           Patient will benefit from skilled therapeutic intervention in order to improve the following deficits and impairments:  Abnormal gait,Decreased range of motion,Difficulty walking,Increased muscle spasms,Decreased activity tolerance,Pain,Decreased balance,Impaired flexibility,Hypomobility,Decreased mobility,Decreased strength,Increased edema  Visit Diagnosis: Muscle weakness (generalized)  Pain in left leg  Stiffness of knee joint, left  Generalized weakness     Problem List Patient Active Problem List   Diagnosis Date Noted  . Iron deficiency anemia 10/01/2020  . Postural dizziness with presyncope 09/30/2020  . Hypertriglyceridemia 04/02/2020  . Aortic atherosclerosis ([place] 02/14/2020  . Nontraumatic complete tear of left rotator cuff   . Diabetic neuropathy ([place] 02/20/2019  . Severe persistent asthma 01/04/2019  . Subcortical microvascular ischemic occlusive disease 07/12/2018  . Rapid palpitations 07/12/2018  . Impingement of left ankle joint 06/26/2018  . Constipation 04/21/2018  . Total knee replacement status, left 12/07/2017  . Dysphagia 09/20/2017  .  Epiglottitis   . Plantar fasciitis, right 07/13/2017  . S/P total knee arthroplasty, right 06/29/2017  . Osteoarthrosis, localized, primary, knee, right   . Sprain of calcaneofibular ligament of right ankle 06/06/2017  . Osteoarthritis of right knee 01/26/2017  . Onychomycosis 10/27/2015  . CKD (chronic kidney disease) stage 3, GFR 30-59 ml/min (HCC) 04/27/2015  . MRSA (methicillin resistant staph aureus) culture positive 03/27/2015  . History of osteomyelitis 03/27/2015  . Fatty liver 09/30/2014  . Hot flashes 07/15/2014  . Abnormal SPEP 04/17/2014  . Fracture of left leg 04/17/2014  . Cushingoid side effect of steroids ([place]) 04/17/2014  . Internal hemorrhoids   . Pulmonary sarcoidosis ([place])   . Major depression  in full remission ([place])   . Preoperative clearance 03/25/2014  . Obstructive chronic bronchitis without exacerbation ([place]) 09/18/2013  . Chest pain 04/11/2013  . Hypertensive heart disease with chronic diastolic congestive heart failure ([place])   . Solitary pulmonary nodule, on CT 02/2013 - stable over 2 years in 2015 02/20/2013  . Gout 08/20/2010  . Deficiency anemia 09/18/2009  . Morbid obesity ([place]) 06/04/2009  . Sleep apnea 04/21/2009  . Sarcoidosis of lung ([place]) 04/10/2007  . Hyperlipidemia associated with type 2 diabetes mellitus ([place]) 08/21/2006  . Hypertension associated with diabetes ([place]) 08/21/2006  . GERD 08/21/2006  . Type II diabetes mellitus with renal manifestations ([place]) 08/21/2006   Lyndee Hensen, PT, DPT 9:17 PM  12/24/20    [facility] [place], [name], [address] Phone: [phone]   Fax:  [phone]  Name: [name] MRN: [id number] Date of Birth: Mar 05, 1961

## 2020-12-24 NOTE — Patient Instructions (Signed)
Health Maintenance Due  Topic Date Due  . COLONOSCOPY (Pts 45-85yrs Insurance coverage will need to be confirmed)  04/29/2020  . COVID-19 Vaccine (3 - Booster for Pfizer series) 07/17/2020    Depression screen [facility] 2/9 10/21/2020 08/21/2020 04/07/2020  Decreased Interest 0 0 0  Down, Depressed, Hopeless 0 0 0  PHQ - 2 Score 0 0 0  Altered sleeping - 0 0  Tired, decreased energy - 0 0  Change in appetite - 0 0  Feeling bad or failure about yourself  - 0 0  Trouble concentrating - 0 0  Moving slowly or fidgety/restless - 0 0  Suicidal thoughts - 0 0  PHQ-9 Score - 0 0  Difficult doing work/chores - - Not difficult at all  Some recent data might be hidden    Recommended follow up: No follow-ups on file.

## 2020-12-25 ENCOUNTER — Encounter: Payer: Self-pay | Admitting: Hematology and Oncology

## 2020-12-28 ENCOUNTER — Other Ambulatory Visit: Payer: Self-pay | Admitting: Cardiology

## 2020-12-30 NOTE — Progress Notes (Signed)
Phone [phone] In person visit   Subjective:   [name] is a 60 y.o. year old very pleasant female patient who presents for/with See problem oriented charting Chief Complaint  Patient presents with  . Diabetes  . Hyperlipidemia  . Hypertension  . Gastroesophageal Reflux   This visit occurred during the SARS-CoV-2 public health emergency.  Safety protocols were in place, including screening questions prior to the visit, additional usage of staff PPE, and extensive cleaning of exam room while observing appropriate contact time as indicated for disinfecting solutions.   Past Medical History-  Patient Active Problem List   Diagnosis Date Noted  . Severe persistent asthma 01/04/2019    Priority: High  . Subcortical microvascular ischemic occlusive disease 07/12/2018    Priority: High  . Obstructive chronic bronchitis without exacerbation ([place]) 09/18/2013    Priority: High  . Chest pain 04/11/2013    Priority: High  . Hypertensive heart disease with chronic diastolic congestive heart failure ([place])     Priority: High  . Sarcoidosis of lung ([place]) 04/10/2007    Priority: High  . Type II diabetes mellitus with renal manifestations ([place]) 08/21/2006    Priority: High  . Hypertriglyceridemia 04/02/2020    Priority: Medium  . Aortic atherosclerosis ([place]) 02/14/2020    Priority: Medium  . Constipation 04/21/2018    Priority: Medium  . Epiglottitis     Priority: Medium  . Osteoarthritis of right knee 01/26/2017    Priority: Medium  . CKD (chronic kidney disease) stage 3, GFR 30-59 ml/min (HCC) 04/27/2015    Priority: Medium  . History of osteomyelitis 03/27/2015    Priority: Medium  . Fatty liver 09/30/2014    Priority: Medium  . Major depression in full remission ([place])     Priority: Medium  . Gout 08/20/2010    Priority: Medium  . Deficiency anemia 09/18/2009    Priority: Medium  . Sleep apnea 04/21/2009    Priority: Medium  . Hyperlipidemia associated with type  2 diabetes mellitus ([place]) 08/21/2006    Priority: Medium  . Hypertension associated with diabetes ([place]) 08/21/2006    Priority: Medium  . Nontraumatic complete tear of left rotator cuff     Priority: Low  . Diabetic neuropathy ([place]) 02/20/2019    Priority: Low  . Rapid palpitations 07/12/2018    Priority: Low  . Impingement of left ankle joint 06/26/2018    Priority: Low  . Total knee replacement status, left 12/07/2017    Priority: Low  . Dysphagia 09/20/2017    Priority: Low  . Plantar fasciitis, right 07/13/2017    Priority: Low  . S/P total knee arthroplasty, right 06/29/2017    Priority: Low  . Osteoarthrosis, localized, primary, knee, right     Priority: Low  . Sprain of calcaneofibular ligament of right ankle 06/06/2017    Priority: Low  . Onychomycosis 10/27/2015    Priority: Low  . MRSA (methicillin resistant staph aureus) culture positive 03/27/2015    Priority: Low  . Hot flashes 07/15/2014    Priority: Low  . Abnormal SPEP 04/17/2014    Priority: Low  . Fracture of left leg 04/17/2014    Priority: Low  . Cushingoid side effect of steroids ([place]) 04/17/2014    Priority: Low  . Internal hemorrhoids     Priority: Low  . Preoperative clearance 03/25/2014    Priority: Low  . Solitary pulmonary nodule, on CT 02/2013 - stable over 2 years in 2015 02/20/2013    Priority: Low  .  Morbid obesity ([place]) 06/04/2009    Priority: Low  . GERD 08/21/2006    Priority: Low  . Iron deficiency anemia 10/01/2020  . Postural dizziness with presyncope 09/30/2020  . Pulmonary sarcoidosis (HCC)     Medications- reviewed and updated Current Outpatient Medications  Medication Sig Dispense Refill  . albuterol (PROVENTIL HFA;VENTOLIN HFA) 108 (90 Base) MCG/ACT inhaler Inhale 1-2 puffs into the lungs every 6 (six) hours as needed for wheezing or shortness of breath. 8 g 5  . allopurinol (ZYLOPRIM) 100 MG tablet Take 100 mg by mouth every evening.     [name] amoxicillin-clavulanate  (AUGMENTIN) 875-125 MG tablet Take 1 tablet by mouth 2 (two) times daily. 20 tablet 0  . aspirin EC 81 MG tablet Take 1 tablet (81 mg total) by mouth daily. 30 tablet 11  . atorvastatin (LIPITOR) 40 MG tablet TAKE 1 TABLET BY MOUTH EVERY DAY 90 tablet 3  . BREO ELLIPTA 200-25 MCG/INH AEPB TAKE 1 PUFF BY MOUTH EVERY DAY 60 each 5  . buPROPion (WELLBUTRIN XL) 300 MG 24 hr tablet TAKE 1 TABLET BY MOUTH EVERY DAY 90 tablet 3  . carvedilol (COREG) 12.5 MG tablet Take 12.5 mg by mouth 2 (two) times daily with a meal.    . chlorpheniramine-HYDROcodone (TUSSIONEX PENNKINETIC ER) 10-8 MG/5ML SUER Take 5 mLs by mouth at bedtime as needed for cough. 70 mL 0  . Cholecalciferol 25 MCG (1000 UT) capsule Take 1,000 Units by mouth in the morning and at bedtime.    [name] DEXILANT 60 MG capsule TAKE 1 CAPSULE BY MOUTH EVERY DAY 90 capsule 3  . diclofenac Sodium (VOLTAREN) 1 % GEL Apply 1 application topically as needed (pain.).     [name] diltiazem (CARDIZEM CD) 120 MG 24 hr capsule TAKE 1 CAPSULE BY MOUTH EVERY DAY 90 capsule 0  . Dulaglutide (TRULICITY) 3 ZH/2.9JM SOPN Inject 0.5 mLs (3 mg total) into the skin once a week. 12 pen 3  . famotidine (PEPCID) 20 MG tablet Take 1 tablet (20 mg total) by mouth at bedtime. 30 tablet 3  . fenofibrate 160 MG tablet TAKE 1 TABLET BY MOUTH EVERY DAY 90 tablet 3  . fluticasone (FLONASE) 50 MCG/ACT nasal spray Place 2 sprays into both nostrils daily as needed for allergies. 48 mL 3  . furosemide (LASIX) 40 MG tablet TAKE 1 TABLET BY MOUTH TWICE A DAY 180 tablet 1  . gabapentin (NEURONTIN) 100 MG capsule TAKE 1 CAPSULE BY MOUTH THREE TIMES A DAY 270 capsule 3  . glucose blood (CONTOUR NEXT TEST) test strip 1 each by Other route 4 (four) times daily. And lancets 4/day 400 each 3  . HYDROcodone-acetaminophen (NORCO/VICODIN) 5-325 MG tablet Take 1 tablet by mouth every 6 (six) hours as needed. 15 tablet 0  . insulin glargine (LANTUS SOLOSTAR) 100 UNIT/ML Solostar Pen Inject 25 Units into  the skin every morning. 30 mL 2  . insulin lispro (HUMALOG KWIKPEN) 100 UNIT/ML KwikPen Inject 0.15 mLs (15 Units total) into the skin daily with supper. 15 mL 29  . Insulin Pen Needle (PEN NEEDLES) 32G X 4 MM MISC 1 each by Does not apply route 4 (four) times daily. E11.9 400 each 0  . ipratropium (ATROVENT) 0.03 % nasal spray Place 2 sprays into both nostrils every 12 (twelve) hours. 30 mL 2  . KLOR-CON M20 20 MEQ tablet TAKE 1 & 1/2 TABLETS BY MOUTH TWICE A DAY 270 tablet 2  . lactulose (CHRONULAC) 10 GM/15ML solution TAKE 15 MLS BY  MOUTH DAILY AS NEEDED FOR CONSTIPATION. 1419 mL 1  . Lancet Devices (EASY MINI EJECT LANCING DEVICE) MISC Use as directed 4 times daily to test blood sugar 1 each 3  . linaclotide (LINZESS) 290 MCG CAPS capsule Take by mouth as needed.    [name] LORazepam (ATIVAN) 0.5 MG tablet Take 0.5 mg by mouth every 12 (twelve) hours as needed for anxiety.    . meloxicam (MOBIC) 15 MG tablet Take 15 mg by mouth daily.    . metFORMIN (GLUCOPHAGE) 1000 MG tablet Take 1 tablet (1,000 mg total) by mouth 2 (two) times daily with a meal. 180 tablet 1  . Microlet Lancets MISC 1 each by Does not apply route 2 (two) times daily. E11.9 100 each 2  . naproxen (NAPROSYN) 500 MG tablet Take 500 mg by mouth 2 (two) times daily.    . nitroGLYCERIN (NITROSTAT) 0.4 MG SL tablet Place 1 tablet (0.4 mg total) under the tongue every 5 (five) minutes as needed for chest pain. Reported on 01/05/2016 25 tablet 6  . ondansetron (ZOFRAN-ODT) 4 MG disintegrating tablet Take 1 tablet (4 mg total) by mouth every 8 (eight) hours as needed for nausea or vomiting. 50 tablet 2  . telmisartan (MICARDIS) 20 MG tablet TAKE 1 TABLET BY MOUTH EVERY DAY 90 tablet 1  . tiZANidine (ZANAFLEX) 4 MG tablet Take 0.5-1 tablets (2-4 mg total) by mouth every 8 (eight) hours as needed for muscle spasms. 30 tablet 1  . venlafaxine XR (EFFEXOR-XR) 75 MG 24 hr capsule TAKE 1 CAPSULE BY MOUTH TWICE A DAY 180 capsule 3   Current  Facility-Administered Medications  Medication Dose Route Frequency Provider Last Rate Last Admin  . cyanocobalamin ((VITAMIN B-12)) injection 1,000 mcg  1,000 mcg Intramuscular Once [name], MD         Objective:  BP 134/70   Pulse 74   Temp (!) 97.4 F (36.3 C) (Temporal)   Ht 5\' 6"  (1.676 m)   Wt 217 lb 9.6 oz (98.7 kg)   SpO2 98%   BMI 35.12 kg/m  Gen: NAD, resting comfortably CV: RRR no murmurs rubs or gallops Lungs: CTAB no crackles, wheeze, rhonchi Ext: no edema Skin: warm, dry    Assessment and Plan   #Fatigue- was seen by samantha- b12 was low and now on weekly injections for a month then once a month. Also had UTI. Seems to be improving on both fronts.   # Depression/hot flashes S: Compliant with Wellbutrin 300 xr, Effexor xr 75mg  and paxil 7.5mg  (for hot flashes).  A/P: reasonable control- continue current meds. phq9 under 5 and no thoughts of self harm    # hypertension/hypertensive heart disease with chronic diastolic CHF/CKD stage III S: compliant with Lasix 40 mg twice a day, diltiazem extended release 120 mg, carvedilol 12.5 mg BID, telmisartan 20mg  daily, imdur 30mg   Renal function stable with GFR in the 40s BP Readings from Last 3 Encounters:  01/01/21 134/70  12/19/20 138/70  12/03/20 (!) 146/70  A/P: hypertension at goal. Diastolic CHF stable. CKD III stable- did worsen after hospitalization but stabilized since then- continue all current meds for all these issues   # hyperlipidemia/aortic atherosclerosis S:compliant with atorvastatin 40 mg, fenofibrate 160mg .  Despite this triglycerides remain high and she was started on Vascepa by cardiology   she also takes aspirin for primary prevention Lab Results  Component Value Date   CHOL 162 03/31/2020   HDL 40 03/31/2020   Sharon 84 03/31/2020  LDLDIRECT 68.0 01/22/2020   TRIG 227 (H) 03/31/2020   CHOLHDL 5 01/22/2020  A/P:  Reasonable control but triglycerides slightly high- continue  current meds- work on lifestyle . Also prefer LDL under 70 with aortic atherosclerosis- working on lifestyle should help- recheck next visit  #Sarcoidosis of lung-follows at [facility] and is on long term prednisone/Obstructive chronic bronchitis without exacerbation /Severe persistent asthma without complication S:  patient compliant with breo 200-25mg  1 puff every day. Sparing use of albuterol- other than with allergies lately   Can get into some tough coughing spells when ill and requests tussionex generally at those times. Also uses flonase for allergies A/P: overall stable- continue current meds  #Type 2 diabetes mellitus/morbid obesity #Diabetic polyneuropathy associated with type 2 diabetes mellitus #fatty liver  - patient compliant with gabapentin 100mg  3x a day for neuropathy. Status stable. Plan continue current meds - follows with endocrinology and on insulin. Chronic steroids contribute to sugar dysregulation - for obesity BMI >40 and fatty liver  and Encouraged need for healthy eating, regular exercise, weight loss.  Down 14 lbs from CPE in august - appetite has bene lower since surgery Wt Readings from Last 3 Encounters:  01/01/21 217 lb 9.6 oz (98.7 kg)  12/23/20 218 lb (98.9 kg)  12/19/20 218 lb (98.9 kg)  LFTs have been norma- will continue to trend  Lab Results  Component Value Date   ALT 21 12/24/2020   AST 18 12/24/2020   ALKPHOS 95 12/24/2020   BILITOT 0.6 12/24/2020   #Gout # arthritic issues- s/p left total knee replacement. ongoign right knee OA S: 0 flares in 6 months at least on allopurinol 100mg  A/P:Stable. Continue current medications.    # Cushingoid side effect of steroids- noted. Continue to monitor.   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 Noted - trend cbc. Has seen oncology in past for positive SPEP but ultimately thought related to sarcoid. Iron infusions at cancer  center  Recommended follow up: Return in about 6 months (around 07/04/2021) for physical or sooner if needed. Future Appointments  Date Time Provider [place]  01/06/2021  8:00 AM Irene Shipper, MD [facility]  01/07/2021  9:30 AM Lyndee Hensen, PT [facility]  02/02/2021 11:15 AM [facility] LAB [facility] None  02/02/2021 11:40 AM Heath Lark, MD [facility] None  02/02/2021 12:30 PM [facility] INFUSION [facility] None  02/04/2021  1:00 PM Renato Shin, MD [facility] None    Lab/Order associations:   ICD-10-CM   1. Hypertension associated with diabetes ([place])  E11.59    I15.2   2. Gastroesophageal reflux disease without esophagitis  K21.9   3. Type 2 diabetes mellitus with other diabetic kidney complication, with long-term current use of insulin (HCC)  E11.29    Z79.4   4. Hyperlipidemia associated with type 2 diabetes mellitus ([place])  E11.69    E78.5   5. Stage 3 chronic kidney disease, unspecified whether stage 3a or 3b CKD (HCC)  N18.30   6. 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 Encounter for screening colonoscopy  Z12.11   8. B12 deficiency  E53.8 cyanocobalamin ((VITAMIN B-12)) injection 1,000 mcg   Meds ordered this encounter  Medications  . cyanocobalamin ((VITAMIN B-12)) injection 1,000 mcg   Return precautions advised.  Garret Reddish, MD

## 2020-12-30 NOTE — Patient Instructions (Addendum)
Health Maintenance Due  Topic Date Due  . COLONOSCOPY - already scheduled 04/29/2020  . COVID-19 Vaccine- please schedule this with your pharmacy 07/17/2020  . OPHTHALMOLOGY EXAM Appointment scheduled. Have them send [place] a copy 12/25/2020   Glad things seem to be stabilizing for you!  Recommended follow up: Return in about 6 months (around 07/04/2021) for physical or sooner if needed.

## 2020-12-31 ENCOUNTER — Ambulatory Visit: Payer: 59 | Admitting: Endocrinology

## 2020-12-31 ENCOUNTER — Other Ambulatory Visit: Payer: Self-pay | Admitting: Family Medicine

## 2020-12-31 ENCOUNTER — Encounter: Payer: 59 | Admitting: Physical Therapy

## 2021-01-01 ENCOUNTER — Ambulatory Visit (INDEPENDENT_AMBULATORY_CARE_PROVIDER_SITE_OTHER): Payer: 59 | Admitting: Family Medicine

## 2021-01-01 ENCOUNTER — Other Ambulatory Visit: Payer: Self-pay

## 2021-01-01 ENCOUNTER — Encounter: Payer: Self-pay | Admitting: Family Medicine

## 2021-01-01 VITALS — BP 134/70 | HR 74 | Temp 97.4°F | Ht 66.0 in | Wt 217.6 lb

## 2021-01-01 DIAGNOSIS — E785 Hyperlipidemia, unspecified: Secondary | ICD-10-CM

## 2021-01-01 DIAGNOSIS — J449 Chronic obstructive pulmonary disease, unspecified: Secondary | ICD-10-CM

## 2021-01-01 DIAGNOSIS — E1129 Type 2 diabetes mellitus with other diabetic kidney complication: Secondary | ICD-10-CM | POA: Diagnosis not present

## 2021-01-01 DIAGNOSIS — E242 Drug-induced Cushing's syndrome: Secondary | ICD-10-CM | POA: Diagnosis not present

## 2021-01-01 DIAGNOSIS — Z1211 Encounter for screening for malignant neoplasm of colon: Secondary | ICD-10-CM

## 2021-01-01 DIAGNOSIS — K219 Gastro-esophageal reflux disease without esophagitis: Secondary | ICD-10-CM

## 2021-01-01 DIAGNOSIS — F3342 Major depressive disorder, recurrent, in full remission: Secondary | ICD-10-CM | POA: Diagnosis not present

## 2021-01-01 DIAGNOSIS — N183 Chronic kidney disease, stage 3 unspecified: Secondary | ICD-10-CM

## 2021-01-01 DIAGNOSIS — E538 Deficiency of other specified B group vitamins: Secondary | ICD-10-CM | POA: Diagnosis not present

## 2021-01-01 DIAGNOSIS — I152 Hypertension secondary to endocrine disorders: Secondary | ICD-10-CM

## 2021-01-01 DIAGNOSIS — E1159 Type 2 diabetes mellitus with other circulatory complications: Secondary | ICD-10-CM

## 2021-01-01 DIAGNOSIS — M1A4720 Other secondary chronic gout, left ankle and foot, without tophus (tophi): Secondary | ICD-10-CM

## 2021-01-01 DIAGNOSIS — E1169 Type 2 diabetes mellitus with other specified complication: Secondary | ICD-10-CM | POA: Diagnosis not present

## 2021-01-01 DIAGNOSIS — Z794 Long term (current) use of insulin: Secondary | ICD-10-CM

## 2021-01-01 DIAGNOSIS — I700 Atherosclerosis of aorta: Secondary | ICD-10-CM

## 2021-01-01 MED ORDER — CYANOCOBALAMIN 1000 MCG/ML IJ SOLN
1000.0000 ug | Freq: Once | INTRAMUSCULAR | Status: AC
  Administered 2021-01-01: 1000 ug via INTRAMUSCULAR

## 2021-01-06 ENCOUNTER — Ambulatory Visit (AMBULATORY_SURGERY_CENTER): Payer: 59 | Admitting: Internal Medicine

## 2021-01-06 ENCOUNTER — Encounter: Payer: Self-pay | Admitting: Internal Medicine

## 2021-01-06 ENCOUNTER — Other Ambulatory Visit: Payer: Self-pay

## 2021-01-06 VITALS — BP 125/58 | HR 69 | Temp 97.2°F | Resp 15 | Ht 66.0 in | Wt 218.0 lb

## 2021-01-06 DIAGNOSIS — D120 Benign neoplasm of cecum: Secondary | ICD-10-CM | POA: Diagnosis not present

## 2021-01-06 DIAGNOSIS — D122 Benign neoplasm of ascending colon: Secondary | ICD-10-CM

## 2021-01-06 DIAGNOSIS — Z1211 Encounter for screening for malignant neoplasm of colon: Secondary | ICD-10-CM | POA: Diagnosis not present

## 2021-01-06 MED ORDER — SODIUM CHLORIDE 0.9 % IV SOLN
500.0000 mL | Freq: Once | INTRAVENOUS | Status: DC
Start: 2021-01-06 — End: 2021-01-06

## 2021-01-06 NOTE — Patient Instructions (Signed)
Await pathology results   YOU HAD AN ENDOSCOPIC PROCEDURE TODAY AT THE [facility]:   Refer to the procedure report that was given to you for any specific questions about what was found during the examination.  If the procedure report does not answer your questions, please call your gastroenterologist to clarify.  If you requested that your care partner not be given the details of your procedure findings, then the procedure report has been included in a sealed envelope for you to review at your convenience later.  YOU SHOULD EXPECT: Some feelings of bloating in the abdomen. Passage of more gas than usual.  Walking can help get rid of the air that was put into your GI tract during the procedure and reduce the bloating. If you had a lower endoscopy (such as a colonoscopy or flexible sigmoidoscopy) you may notice spotting of blood in your stool or on the toilet paper. If you underwent a bowel prep for your procedure, you may not have a normal bowel movement for a few days.  Please Note:  You might notice some irritation and congestion in your nose or some drainage.  This is from the oxygen used during your procedure.  There is no need for concern and it should clear up in a day or so.  SYMPTOMS TO REPORT IMMEDIATELY:   Following lower endoscopy (colonoscopy or flexible sigmoidoscopy):  Excessive amounts of blood in the stool  Significant tenderness or worsening of abdominal pains  Swelling of the abdomen that is new, acute  Fever of 100F or higher  For urgent or emergent issues, a gastroenterologist can be reached at any hour by calling [phone]. Do not use MyChart messaging for urgent concerns.    DIET:  We do recommend a small meal at first, but then you may proceed to your regular diet.  Drink plenty of fluids but you should avoid alcoholic beverages for 24 hours.  ACTIVITY:  You should plan to take it easy for the rest of today and you should NOT DRIVE or use heavy  machinery until tomorrow (because of the sedation medicines used during the test).    FOLLOW UP: Our staff will call the number listed on your records 48-72 hours following your procedure to check on you and address any questions or concerns that you may have regarding the information given to you following your procedure. If we do not reach you, we will leave a message.  We will attempt to reach you two times.  During this call, we will ask if you have developed any symptoms of COVID 19. If you develop any symptoms (ie: fever, flu-like symptoms, shortness of breath, cough etc.) before then, please call [phone].  If you test positive for Covid 19 in the 2 weeks post procedure, please call and report this information to us.    If any biopsies were taken you will be contacted by phone or by letter within the next 1-3 weeks.  Please call us at [phone] if you have not heard about the biopsies in 3 weeks.    SIGNATURES/CONFIDENTIALITY: You and/or your care partner have signed paperwork which will be entered into your electronic medical record.  These signatures attest to the fact that that the information above on your After Visit Summary has been reviewed and is understood.  Full responsibility of the confidentiality of this discharge information lies with you and/or your care-partner. 

## 2021-01-06 NOTE — Progress Notes (Signed)
VS by CW.  Virtual previsit complete

## 2021-01-06 NOTE — Progress Notes (Signed)
Report to PACU, RN, vss, BBS= Clear.  

## 2021-01-06 NOTE — Progress Notes (Signed)
Called to room to assist during endoscopic procedure.  Patient ID and intended procedure confirmed with present staff. Received instructions for my participation in the procedure from the performing physician.  

## 2021-01-06 NOTE — Op Note (Signed)
[facility] Patient Name: [name] Procedure Date: 01/06/2021 8:19 AM MRN: [id number] Endoscopist: Docia Chuck. [name] , MD Age: 60 Referring MD:  Date of Birth: 02/16/1961 Gender: Female Account #: [id number] Procedure:                Colonoscopy with cold snare polypectomy x 2 Indications:              Screening for colorectal malignant neoplasm.                            Negative index exam July 2011 Medicines:                Monitored Anesthesia Care Procedure:                Pre-Anesthesia Assessment:                           - Prior to the procedure, a History and Physical                            was performed, and patient medications and                            allergies were reviewed. The patient's tolerance of                            previous anesthesia was also reviewed. The risks                            and benefits of the procedure and the sedation                            options and risks were discussed with the patient.                            All questions were answered, and informed consent                            was obtained. Prior Anticoagulants: The patient has                            taken no previous anticoagulant or antiplatelet                            agents. After reviewing the risks and benefits, the                            patient was deemed in satisfactory condition to                            undergo the procedure.                           After obtaining informed consent, the colonoscope  was passed under direct vision. Throughout the                            procedure, the patient's blood pressure, pulse, and                            oxygen saturations were monitored continuously. The                            Colonoscope was introduced through the anus and                            advanced to the the cecum, identified by                            appendiceal orifice and  ileocecal valve. The                            ileocecal valve, appendiceal orifice, and rectum                            were photographed. The quality of the bowel                            preparation was excellent. The colonoscopy was                            performed without difficulty. The patient tolerated                            the procedure well. The bowel preparation used was                            SUPREP/tablets via split dose instruction. Scope In: 8:27:19 AM Scope Out: 8:45:19 AM Scope Withdrawal Time: 0 hours 14 minutes 14 seconds  Total Procedure Duration: 0 hours 18 minutes 0 seconds  Findings:                 Two polyps were found in the ascending colon and                            cecum. The polyps were 1 to 4 mm in size. These                            polyps were removed with a cold snare. Resection                            and retrieval were complete.                           Internal hemorrhoids were found during                            retroflexion. The hemorrhoids were small.  The exam was otherwise without abnormality on                            direct and retroflexion views. Complications:            No immediate complications. Estimated blood loss:                            None. Estimated Blood Loss:     Estimated blood loss: none. Impression:               - Two 1 to 4 mm polyps in the ascending colon and                            in the cecum, removed with a cold snare. Resected                            and retrieved.                           - Internal hemorrhoids.                           - The examination was otherwise normal on direct                            and retroflexion views. Recommendation:           - Repeat colonoscopy in 7-10 years for surveillance.                           - Patient has a contact number available for                            emergencies. The signs and symptoms of  potential                            delayed complications were discussed with the                            patient. Return to normal activities tomorrow.                            Written discharge instructions were provided to the                            patient.                           - Resume previous diet.                           - Continue present medications.                           - Await pathology results. Docia Chuck. [name], MD 01/06/2021 8:53:35 AM This report has been signed electronically.

## 2021-01-07 ENCOUNTER — Ambulatory Visit (INDEPENDENT_AMBULATORY_CARE_PROVIDER_SITE_OTHER): Payer: 59 | Admitting: Physical Therapy

## 2021-01-07 ENCOUNTER — Encounter: Payer: Self-pay | Admitting: Physical Therapy

## 2021-01-07 ENCOUNTER — Ambulatory Visit (INDEPENDENT_AMBULATORY_CARE_PROVIDER_SITE_OTHER): Payer: 59

## 2021-01-07 DIAGNOSIS — M79605 Pain in left leg: Secondary | ICD-10-CM

## 2021-01-07 DIAGNOSIS — M6281 Muscle weakness (generalized): Secondary | ICD-10-CM | POA: Diagnosis not present

## 2021-01-07 DIAGNOSIS — E538 Deficiency of other specified B group vitamins: Secondary | ICD-10-CM

## 2021-01-07 DIAGNOSIS — M25662 Stiffness of left knee, not elsewhere classified: Secondary | ICD-10-CM | POA: Diagnosis not present

## 2021-01-07 MED ORDER — CYANOCOBALAMIN 1000 MCG/ML IJ SOLN
1000.0000 ug | Freq: Once | INTRAMUSCULAR | Status: AC
  Administered 2021-01-07: 1000 ug via INTRAMUSCULAR

## 2021-01-07 NOTE — Progress Notes (Signed)
Per orders of Dr. Yong Channel, injection of B-12 given by Tobe Sos in right deltoid. Patient tolerated injection well. Patient will make appointment for 1 week.

## 2021-01-07 NOTE — Patient Instructions (Signed)
Access Code: [email or web address] URL: [email or web address] Date: 01/07/2021 Prepared by: Lyndee Hensen  Exercises Supine Heel Slide - 2 x daily - 2 sets - 10 reps Straight Leg Raise - 1 x daily - 2 sets - 10 reps Sidelying Hip Abduction - 1 x daily - 2 sets - 10 reps Step Up - 1 x daily - 1 sets - 10 reps Standing Hip Abduction with Counter Support - 1 x daily - 1-2 sets - 10 reps Seated Knee Extension AROM - 1 x daily - 2 sets - 10 reps Side Stepping with Counter Support - 1 x daily - 2 sets - 10 reps Walking March - 1 x daily - 2 sets - 10 reps Standing Tandem Balance with Counter Support - 2 x daily - 3 reps - 30 hold Single Leg Stance - 1 x daily - 3 reps - 30 hold

## 2021-01-07 NOTE — Therapy (Signed)
[facility] [address] [place], [name], [address] Phone: [phone]   Fax:  [phone]  Physical Therapy Treatment/Discharge   Patient Details  Name: [name] MRN: [id number] Date of Birth: 1960/12/01 Referring Provider (PT): Garret Reddish   Encounter Date: 01/07/2021   PT End of Session - 01/07/21 0938    Visit Number 10    Number of Visits 12    Date for PT Re-Evaluation 01/21/21    Authorization Type UHC    PT Start Time 0900    PT Stop Time 0935    PT Time Calculation (min) 35 min    Activity Tolerance Patient tolerated treatment well    Behavior During Therapy [facility] for tasks assessed/performed           Past Medical History:  Diagnosis Date  . Abnormal SPEP 04/17/2014  . Acute left ankle pain 01/26/2017  . ANEMIA-UNSPECIFIED 09/18/2009  . Anxiety   . Arthritis   . Blood transfusion without reported diagnosis   . Cataract   . CHF (congestive heart failure) ([place])   . Chronic diastolic heart failure, NYHA class 2 (HCC)    Normal LVEDP by May 2018  . COPD (chronic obstructive pulmonary disease) ([place])   . Depression   . DIABETES MELLITUS, TYPE II 08/21/2006  . Diabetic osteomyelitis ([place]) 05/29/2015  . Fatty liver   . Fracture of 5th metatarsal    non union  . GERD 24/06/7352   Had fundoplication  . GOUT 08/20/2010  . Hammer toe of right foot    2-5th toes  . Hx of umbilical hernia repair   . HYPERLIPIDEMIA 08/21/2006  . HYPERTENSION 08/21/2006  . Infection of wound due to methicillin resistant Staphylococcus aureus (MRSA)   . Internal hemorrhoids   . Kidney problem   . Multiple allergies 10/14/2016  . OBESITY 06/04/2009  . Onychomycosis 10/27/2015  . Osteomyelitis of left foot ([place]) 05/29/2015  . Osteoporosis   . Pulmonary sarcoidosis ([place])    Followed locally by pulmonology, but also by Dr. Casper Harrison at [facility]  . Right knee pain 01/26/2017  . Vocal cord dysfunction   . Wears partial  dentures     Past Surgical History:  Procedure Laterality Date  . ABDOMINAL HYSTERECTOMY    . APPENDECTOMY    . BLADDER SUSPENSION  11/11/2011   Procedure: TRANSVAGINAL TAPE (TVT) PROCEDURE;  Surgeon: Olga Millers, MD;  Location: [facility];  Service: Gynecology;  Laterality: N/A;  . CAROTIDS  02/18/11   CAROTID DUPLEX; VERTEBRALS ARE PATENT WITH ANTEGRADE FLOW. ICA/CCA RATIO 1.61 ON RIGHT AND 0.75 ON LEFT  . CATARACT EXTRACTION, BILATERAL    . CHOLECYSTECTOMY  1984  . COLONOSCOPY  04/29/2010   [name]  . CYSTOSCOPY  11/11/2011   Procedure: CYSTOSCOPY;  Surgeon: Olga Millers, MD;  Location: [facility];  Service: Gynecology;  Laterality: N/A;  . EXTUBATION (ENDOTRACHEAL) IN OR N/A 09/23/2017   Procedure: EXTUBATION (ENDOTRACHEAL) IN OR;  Surgeon: Helayne Seminole, MD;  Location: [facility];  Service: ENT;  Laterality: N/A;  . FIBEROPTIC LARYNGOSCOPY AND TRACHEOSCOPY N/A 09/23/2017   Procedure: FLEXIBLE FIBEROPTIC LARYNGOSCOPY;  Surgeon: Helayne Seminole, MD;  Location: [facility];  Service: ENT;  Laterality: N/A;  . FRACTURE SURGERY     foot  . HALLUX FUSION Left 10/02/2018   Procedure: HALLUX ARTHRODESIS;  Surgeon: Edrick Kins, DPM;  Location: [facility];  Service: Podiatry;  Laterality: Left;  . HAMMER TOE SURGERY Left 10/02/2018  Procedure: HAMMER TOE CORRECTION 2ND 3RD 4RD FIFTH TOE;  Surgeon: Edrick Kins, DPM;  Location: [facility];  Service: Podiatry;  Laterality: Left;  . HAMMER TOE SURGERY Right 04/12/2019   Procedure: HAMMER TOE CORRECTION, 2ND, 3RD, 4TH AND 5TH TOES OF RIGHT FOOT;  Surgeon: Edrick Kins, DPM;  Location: [facility];  Service: Podiatry;  Laterality: Right;  . HAMMER TOE SURGERY Left 10/25/2019   Procedure: HAMMER TOE REPAIR SECOND, THIRD, FOUTH;  Surgeon: Edrick Kins, DPM;  Location: [facility];  Service: Podiatry;  Laterality: Left;  . HERNIA REPAIR    . I & D EXTREMITY Left 06/27/2015   Procedure: Partial Excision Left Calcaneus, Place Antibiotic Beads, and Wound VAC;   Surgeon: Newt Minion, MD;  Location: [facility];  Service: Orthopedics;  Laterality: Left;  . JOINT REPLACEMENT    . KNEE ARTHROSCOPY     right  . LEFT AND RIGHT HEART CATHETERIZATION WITH CORONARY ANGIOGRAM N/A 04/23/2013   Procedure: LEFT AND RIGHT HEART CATHETERIZATION WITH CORONARY ANGIOGRAM;  Surgeon: Leonie Man, MD;  Location: [facility] CATH LAB;  Service: Cardiovascular;  Laterality: N/A;  . LEFT HEART CATH AND CORONARY ANGIOGRAPHY N/A 03/11/2017   Procedure: Left Heart Cath and Coronary Angiography;  Surgeon: Sherren Mocha, MD;  Location: [facility] CV LAB; angiographically minimal CAD in the LAD otherwise normal.;  Normal LVEDP.  FALSE POSITIVE MYOVIEW  . LEFT HEART CATH AND CORONARY ANGIOGRAPHY  07/20/2010   LVEF 50-55% WITH VERY MILD GLOBAL HYPOKINESIA; ESSENTIALLY NORMAL CORONARY ARTERIES; NORMAL LV FUNCTION  . METATARSAL OSTEOTOMY WITH OPEN REDUCTION INTERNAL FIXATION (ORIF) METATARSAL WITH FUSION Left 04/09/2014   Procedure: LEFT FOOT FRACTURE OPEN TREATMENT METATARSAL INCLUDES INTERNAL FIXATION EACH;  Surgeon: Lorn Junes, MD;  Location: [facility];  Service: Orthopedics;  Laterality: Left;  . NISSEN FUNDOPLICATION  3762  . NM MYOVIEW LTD --> FALSE POSITIVE  03/10/2017   : Moderate size "stress-induced "perfusion defect at the apex as well as "ill-defined stress-induced perfusion defect in the lateral wall.  EF 55%.  INTERMEDIATE risk. -->  FALSE POSITIVE  . ORIF DISTAL FEMUR FRACTURE  08/08/2020   [facility]: ORIF of left extra-articular periprosthetic distal femur fracture with synthesis of the femur plate with cortical nonlocking screws.  (Thought to be pathologic fracture due to osteoporosis) -> after a fall  . ORIF FEMUR W/ PERI-IMPLANT  08/29/2020   [facility]) revision of left femur fracture ORIF plate fixation screws; culture positive for MSSA  . Right and left CARDIAC CATHETERIZATION  04/23/2013   Angiographic normal coronaries; LVEDP  20 mmHg, PCWP 12-14 mmHg, RAP 12 mmHg.; Fick CO/CI 4.9/2.2  . SHOULDER ARTHROSCOPY Left 03/14/2019   Procedure: LEFT SHOULDER ARTHROSCOPY, DEBRIDEMENT, AND DECOMPRESSION;  Surgeon: Newt Minion, MD;  Location: [facility];  Service: Orthopedics;  Laterality: Left;  . TOTAL KNEE ARTHROPLASTY Right 06/29/2017   Procedure: RIGHT TOTAL KNEE ARTHROPLASTY;  Surgeon: Newt Minion, MD;  Location: [facility];  Service: Orthopedics;  Laterality: Right;  . TOTAL KNEE ARTHROPLASTY Left 12/07/2017  . TOTAL KNEE ARTHROPLASTY Left 12/07/2017   Procedure: LEFT TOTAL KNEE ARTHROPLASTY;  Surgeon: Newt Minion, MD;  Location: [facility];  Service: Orthopedics;  Laterality: Left;  . TRACHEOSTOMY TUBE PLACEMENT N/A 09/20/2017   Procedure: AWAKE INTUBATION WITH ANESTHESIA WITH VIDEO ASSISTANCE;  Surgeon: Helayne Seminole, MD;  Location: [facility];  Service: ENT;  Laterality: N/A;  . TRANSTHORACIC ECHOCARDIOGRAM  08/2014   Normal LV size  and function.  Mild LVH.  EF 55-60%.  Normal regional wall motion.  GR 1 DD.  Normal RV size and function .  [name] TRANSTHORACIC ECHOCARDIOGRAM  08/12/2020    [facility]): Normal LV size and function.  EF 55 to 60%.  No RWM A.  Normal filling pattern.  Normal RV.  No significant valvular disease-stenosis or regurgitation.  No vegetation.  . TUBAL LIGATION     with reversal in 1994  . UPPER GASTROINTESTINAL ENDOSCOPY    . VENTRAL HERNIA REPAIR      There were no vitals filed for this visit.   Subjective Assessment - 01/07/21 0937    Subjective Pt states doing very well. Went to beach this past weekend, walked quite far when shopping, reports no pain or difficulty since last visit.    Currently in Pain? No/denies    Pain Score 0-No pain              [facility] PT Assessment - 01/07/21 0001      AROM   Left Knee Flexion 110      Strength   Overall Strength Comments 5/5 knee and hips                         [facility] Adult PT Treatment/Exercise - 01/07/21 0001       Exercises   Exercises Knee/Hip      Knee/Hip Exercises: Aerobic   Recumbent Bike L 2 x 8 min      Knee/Hip Exercises: Standing   Hip Flexion 20 reps;Knee bent    Hip Abduction Both;2 sets;20 reps    Lateral Step Up 10 reps;Hand Hold: 1;Step Height: 6"    Forward Step Up 10 reps;Hand Hold: 1;Step Height: 6"    Functional Squat 2 sets;10 reps    Other Standing Knee Exercises walk/march 10 ft x 4;    Other Standing Knee Exercises SLS 10 sec x 8 on L ;      Knee/Hip Exercises: Seated   Long Arc Quad 20 reps    Long Arc Quad Weight 3 lbs.      Knee/Hip Exercises: Supine   Bridges 2 sets;10 reps      Knee/Hip Exercises: Sidelying   Hip ABduction 20 reps      Manual Therapy   Manual Therapy Soft tissue mobilization;Passive ROM                  PT Education - 01/07/21 0938    Education Details Reviewed Final HEP    Person(s) Educated Patient    Methods Explanation;Demonstration;Handout;Verbal cues    Comprehension Verbalized understanding;Returned demonstration;Verbal cues required            PT Short Term Goals - 01/07/21 0939      PT SHORT TERM GOAL #1   Title Pt to be independent with initial HEP    Time 2    Period Weeks    Status Achieved    Target Date 11/17/20      PT SHORT TERM GOAL #2   Title Pt to demo improved ROM of L knee by at 10 degrees    Baseline ROM 110 deg;    Time 4    Period Weeks    Status Partially Met    Target Date 11/17/20             PT Long Term Goals - 01/07/21 0939      PT LONG TERM GOAL #  1   Title Pt to be independent with HEP    Time 6    Period Weeks    Status Achieved      PT LONG TERM GOAL #2   Title Pt to demo L knee ROM to be at least 115 deg    Baseline ROM 110    Time 6    Period Weeks    Status Partially Met      PT LONG TERM GOAL #3   Title Pt to demo L quad at hip strength to be at least 4+/5 to improve stability and gait    Time 6    Period Weeks    Status Achieved      PT LONG TERM GOAL #4    Title Pt to demo stability and [place] of L LE to be WNL with gait, dynamic balance and single leg stance    Time 6    Period Weeks    Status Achieved                 Plan - 01/07/21 6213    Clinical Impression Statement Pt has made good progress, has met all goals at this time. Pt with no pain, and much improved strength and stability. She has ROM in L knee, up to 110 deg, has not made further improvments in a couple weeks. She has much improved single leg stability, still challenged with SLS, and mild hip weakness on L. WIll continue to work on these things for HEP. Gait mechanics much improved, with decreased trendellenberg, and more equal stance time. She has L LE shorter than R, so she does have very mild deviation from this. Pt doing very well and will be d/c to HEP at this time, Final HEP reviwed in detail. Discussed safe progression of gym activities. Pt in agreement with d/c plan.    Personal Factors and Comorbidities Fitness;Comorbidity 1    Comorbidities surgery with complications, L TKA already present    Examination-Activity Limitations Stand;Stairs;Squat;Locomotion Level    Examination-Participation Restrictions Cleaning;Community Activity;Shop    Stability/Clinical Decision Making Evolving/Moderate complexity    Rehab Potential Good    PT Frequency 2x / week    PT Duration 6 weeks    PT Treatment/Interventions ADLs/Self Care Home Management;Cryotherapy;Electrical Stimulation;DME Instruction;Ultrasound;Moist Heat;Iontophoresis 4mg /ml Dexamethasone;Gait training;Stair training;Functional mobility training;Therapeutic activities;Therapeutic exercise;Balance training;Patient/family education;Neuromuscular re-education;Manual techniques;Passive range of motion;Dry needling;Taping;Vasopneumatic Device;Spinal Manipulations;Joint Manipulations    PT Next Visit Plan review squatting, bridge with band, review step up, TKE    Consulted and Agree with Plan of Care Patient            Patient will benefit from skilled therapeutic intervention in order to improve the following deficits and impairments:  Abnormal gait,Decreased range of motion,Difficulty walking,Increased muscle spasms,Decreased activity tolerance,Pain,Decreased balance,Impaired flexibility,Hypomobility,Decreased mobility,Decreased strength,Increased edema  Visit Diagnosis: Pain in left leg  Stiffness of knee joint, left  Muscle weakness (generalized)     Problem List Patient Active Problem List   Diagnosis Date Noted  . Iron deficiency anemia 10/01/2020  . Postural dizziness with presyncope 09/30/2020  . Hypertriglyceridemia 04/02/2020  . Aortic atherosclerosis ([place]) 02/14/2020  . Nontraumatic complete tear of left rotator cuff   . Diabetic neuropathy ([place]) 02/20/2019  . Severe persistent asthma 01/04/2019  . Subcortical microvascular ischemic occlusive disease 07/12/2018  . Rapid palpitations 07/12/2018  . Impingement of left ankle joint 06/26/2018  . Constipation 04/21/2018  . Total knee replacement status, left 12/07/2017  . Dysphagia 09/20/2017  . Epiglottitis   .  Plantar fasciitis, right 07/13/2017  . S/P total knee arthroplasty, right 06/29/2017  . Osteoarthrosis, localized, primary, knee, right   . Sprain of calcaneofibular ligament of right ankle 06/06/2017  . Osteoarthritis of right knee 01/26/2017  . Onychomycosis 10/27/2015  . CKD (chronic kidney disease) stage 3, GFR 30-59 ml/min (HCC) 04/27/2015  . MRSA (methicillin resistant staph aureus) culture positive 03/27/2015  . History of osteomyelitis 03/27/2015  . Fatty liver 09/30/2014  . Hot flashes 07/15/2014  . Abnormal SPEP 04/17/2014  . Fracture of left leg 04/17/2014  . Cushingoid side effect of steroids ([place]) 04/17/2014  . Internal hemorrhoids   . Pulmonary sarcoidosis ([place])   . Major depression in full remission ([place])   . Preoperative clearance 03/25/2014  . Obstructive chronic bronchitis without exacerbation  ([place]) 09/18/2013  . Chest pain 04/11/2013  . Hypertensive heart disease with chronic diastolic congestive heart failure ([place])   . Solitary pulmonary nodule, on CT 02/2013 - stable over 2 years in 2015 02/20/2013  . Gout 08/20/2010  . Deficiency anemia 09/18/2009  . Morbid obesity ([place]) 06/04/2009  . Sleep apnea 04/21/2009  . Sarcoidosis of lung ([place]) 04/10/2007  . Hyperlipidemia associated with type 2 diabetes mellitus ([place]) 08/21/2006  . Hypertension associated with diabetes ([place]) 08/21/2006  . GERD 08/21/2006  . Type II diabetes mellitus with renal manifestations ([place]) 08/21/2006    Lyndee Hensen, PT, DPT 9:45 AM  01/07/21    [facility] [place], [name], [address] Phone: [phone]   Fax:  [phone]  Name: JUSTYN BOYSON MRN: [id number] Date of Birth: 01/11/1961   PHYSICAL THERAPY DISCHARGE SUMMARY  Visits from Start of Care: 10 Plan: Patient agrees to discharge.  Patient goals were met. Patient is being discharged due to meeting the stated rehab goals.  ?????    Lyndee Hensen, PT, DPT 9:45 AM  01/07/21

## 2021-01-08 ENCOUNTER — Telehealth: Payer: Self-pay

## 2021-01-08 NOTE — Telephone Encounter (Signed)
  Follow up Call-  Call back number 01/06/2021 06/20/2018  Post procedure Call Back phone  # [phone]  Permission to leave phone message Yes Yes  Some recent data might be hidden     Patient questions:  Do you have a fever, pain , or abdominal swelling? No. Pain Score  0 *  Have you tolerated food without any problems? Yes.    Have you been able to return to your normal activities? Yes.    Do you have any questions about your discharge instructions: Diet   No. Medications  No. Follow up visit  No.  Do you have questions or concerns about your Care? No.  Actions: * If pain score is 4 or above: No action needed, pain <4. 1. Have you developed a fever since your procedure? no  2.   Have you had an respiratory symptoms (SOB or cough) since your procedure? no  3.   Have you tested positive for COVID 19 since your procedure no  4.   Have you had any family members/close contacts diagnosed with the COVID 19 since your procedure?  no   If yes to any of these questions please route to Joylene John, RN and Joella Prince, RN

## 2021-01-11 ENCOUNTER — Other Ambulatory Visit: Payer: Self-pay | Admitting: Family Medicine

## 2021-01-13 ENCOUNTER — Encounter: Payer: Self-pay | Admitting: Internal Medicine

## 2021-01-13 DIAGNOSIS — Z01419 Encounter for gynecological examination (general) (routine) without abnormal findings: Secondary | ICD-10-CM | POA: Diagnosis not present

## 2021-01-13 DIAGNOSIS — R32 Unspecified urinary incontinence: Secondary | ICD-10-CM | POA: Diagnosis not present

## 2021-01-17 ENCOUNTER — Other Ambulatory Visit: Payer: Self-pay | Admitting: Family Medicine

## 2021-01-20 ENCOUNTER — Other Ambulatory Visit: Payer: Self-pay | Admitting: Family Medicine

## 2021-01-20 ENCOUNTER — Encounter: Payer: Self-pay | Admitting: Family Medicine

## 2021-01-21 DIAGNOSIS — R32 Unspecified urinary incontinence: Secondary | ICD-10-CM | POA: Diagnosis not present

## 2021-01-26 ENCOUNTER — Other Ambulatory Visit: Payer: Self-pay | Admitting: Family Medicine

## 2021-01-27 MED ORDER — DILTIAZEM HCL ER COATED BEADS 120 MG PO CP24
120.0000 mg | ORAL_CAPSULE | Freq: Every day | ORAL | 3 refills | Status: DC
Start: 2021-01-27 — End: 2022-01-28

## 2021-02-02 ENCOUNTER — Inpatient Hospital Stay (HOSPITAL_BASED_OUTPATIENT_CLINIC_OR_DEPARTMENT_OTHER): Payer: 59 | Admitting: Hematology and Oncology

## 2021-02-02 ENCOUNTER — Other Ambulatory Visit: Payer: Self-pay

## 2021-02-02 ENCOUNTER — Inpatient Hospital Stay: Payer: 59 | Attending: Hematology and Oncology

## 2021-02-02 ENCOUNTER — Inpatient Hospital Stay: Payer: 59

## 2021-02-02 ENCOUNTER — Other Ambulatory Visit: Payer: Self-pay | Admitting: Cardiology

## 2021-02-02 ENCOUNTER — Encounter: Payer: Self-pay | Admitting: Hematology and Oncology

## 2021-02-02 DIAGNOSIS — Z881 Allergy status to other antibiotic agents status: Secondary | ICD-10-CM | POA: Diagnosis not present

## 2021-02-02 DIAGNOSIS — D509 Iron deficiency anemia, unspecified: Secondary | ICD-10-CM | POA: Insufficient documentation

## 2021-02-02 DIAGNOSIS — D869 Sarcoidosis, unspecified: Secondary | ICD-10-CM | POA: Diagnosis not present

## 2021-02-02 DIAGNOSIS — R110 Nausea: Secondary | ICD-10-CM | POA: Insufficient documentation

## 2021-02-02 DIAGNOSIS — E1122 Type 2 diabetes mellitus with diabetic chronic kidney disease: Secondary | ICD-10-CM | POA: Insufficient documentation

## 2021-02-02 DIAGNOSIS — E538 Deficiency of other specified B group vitamins: Secondary | ICD-10-CM | POA: Insufficient documentation

## 2021-02-02 DIAGNOSIS — N183 Chronic kidney disease, stage 3 unspecified: Secondary | ICD-10-CM

## 2021-02-02 DIAGNOSIS — D539 Nutritional anemia, unspecified: Secondary | ICD-10-CM | POA: Diagnosis not present

## 2021-02-02 DIAGNOSIS — Z888 Allergy status to other drugs, medicaments and biological substances status: Secondary | ICD-10-CM | POA: Diagnosis not present

## 2021-02-02 DIAGNOSIS — Z79899 Other long term (current) drug therapy: Secondary | ICD-10-CM | POA: Diagnosis not present

## 2021-02-02 DIAGNOSIS — R5383 Other fatigue: Secondary | ICD-10-CM | POA: Insufficient documentation

## 2021-02-02 DIAGNOSIS — I509 Heart failure, unspecified: Secondary | ICD-10-CM | POA: Insufficient documentation

## 2021-02-02 DIAGNOSIS — E669 Obesity, unspecified: Secondary | ICD-10-CM | POA: Insufficient documentation

## 2021-02-02 DIAGNOSIS — D508 Other iron deficiency anemias: Secondary | ICD-10-CM

## 2021-02-02 LAB — CBC WITH DIFFERENTIAL/PLATELET
Abs Immature Granulocytes: 0.14 10*3/uL — ABNORMAL HIGH (ref 0.00–0.07)
Basophils Absolute: 0.1 10*3/uL (ref 0.0–0.1)
Basophils Relative: 1 %
Eosinophils Absolute: 0.3 10*3/uL (ref 0.0–0.5)
Eosinophils Relative: 3 %
HCT: 33.4 % — ABNORMAL LOW (ref 36.0–46.0)
Hemoglobin: 10.6 g/dL — ABNORMAL LOW (ref 12.0–15.0)
Immature Granulocytes: 2 %
Lymphocytes Relative: 25 %
Lymphs Abs: 2.1 10*3/uL (ref 0.7–4.0)
MCH: 28.6 pg (ref 26.0–34.0)
MCHC: 31.7 g/dL (ref 30.0–36.0)
MCV: 90.3 fL (ref 80.0–100.0)
Monocytes Absolute: 0.5 10*3/uL (ref 0.1–1.0)
Monocytes Relative: 6 %
Neutro Abs: 5.4 10*3/uL (ref 1.7–7.7)
Neutrophils Relative %: 63 %
Platelets: 317 10*3/uL (ref 150–400)
RBC: 3.7 MIL/uL — ABNORMAL LOW (ref 3.87–5.11)
RDW: 14.8 % (ref 11.5–15.5)
WBC: 8.5 10*3/uL (ref 4.0–10.5)
nRBC: 0 % (ref 0.0–0.2)

## 2021-02-02 LAB — COMPREHENSIVE METABOLIC PANEL
ALT: 16 U/L (ref 0–44)
AST: 21 U/L (ref 15–41)
Albumin: 3.9 g/dL (ref 3.5–5.0)
Alkaline Phosphatase: 81 U/L (ref 38–126)
Anion gap: 8 (ref 5–15)
BUN: 16 mg/dL (ref 8–23)
CO2: 28 mmol/L (ref 22–32)
Calcium: 9.3 mg/dL (ref 8.9–10.3)
Chloride: 105 mmol/L (ref 98–111)
Creatinine, Ser: 1.03 mg/dL — ABNORMAL HIGH (ref 0.44–1.00)
GFR, Estimated: >60 mL/min (ref >60)
Glucose, Bld: 138 mg/dL — ABNORMAL HIGH (ref 70–99)
Potassium: 5.1 mmol/L (ref 3.5–5.1)
Sodium: 141 mmol/L (ref 135–145)
Total Bilirubin: 0.5 mg/dL (ref 0.3–1.2)
Total Protein: 7 g/dL (ref 6.5–8.1)

## 2021-02-02 LAB — SAMPLE TO BLOOD BANK

## 2021-02-02 LAB — FERRITIN: Ferritin: 125 ng/mL (ref 11–307)

## 2021-02-02 LAB — IRON AND TIBC
Iron: 62 ug/dL (ref 41–142)
Saturation Ratios: 14 % — ABNORMAL LOW (ref 21–57)
TIBC: 443 ug/dL (ref 236–444)
UIBC: 381 ug/dL (ref 120–384)

## 2021-02-02 MED ORDER — SODIUM CHLORIDE 0.9 % IV SOLN
Freq: Once | INTRAVENOUS | Status: AC
  Filled 2021-02-02: qty 250

## 2021-02-02 MED ORDER — SODIUM CHLORIDE 0.9 % IV SOLN
300.0000 mg | Freq: Once | INTRAVENOUS | Status: AC
  Administered 2021-02-02: 300 mg via INTRAVENOUS
  Filled 2021-02-02: qty 300

## 2021-02-02 MED ORDER — FUROSEMIDE 40 MG PO TABS: 40.0000 mg | ORAL_TABLET | Freq: Every day | ORAL | 1 refills | Status: DC

## 2021-02-02 NOTE — Assessment & Plan Note (Signed)
We discussed risk factor modification She is interested in new diet plan to help her lose weight and hopefully control other risk factors I encouraged her to do so It should not interfere with her treatment for IV iron and B12 replacement therapy

## 2021-02-02 NOTE — Progress Notes (Signed)
Pt. declines to stay 30 minutes for post observation, states she has tolerated treatment without any issues. Instructed pt. Per Dr. Alvy Bimler, Iron studies look good and no further iron needed at this time. She will follow up and see her in 3 months.

## 2021-02-02 NOTE — Progress Notes (Signed)
[place] OFFICE PROGRESS NOTE  [name], MD  ASSESSMENT & PLAN:  Deficiency anemia I have reviewed blood work with the patient and her husband Overall, she has multifactorial anemia, combination of iron deficiency as well as B12 deficiency She is receiving B12 supplements through her primary care doctor's office She will receive 1 dose of iron sucrose today At the end of the day, her iron studies reveal adequate iron replacement except for borderline low saturation which will hopefully resolve with 1 dose of IV iron Plan to see her again in 3 months for further follow-up  CKD (chronic kidney disease) stage 3, GFR 30-59 ml/min (HCC) She has intermittent chronic kidney disease secondary to cardiac issues and diabetes She will continue aggressive risk factor modification There could be a component of anemia chronic kidney disease but with her current level of hemoglobin, she does not need ESA  Class II obesity We discussed risk factor modification She is interested in new diet plan to help her lose weight and hopefully control other risk factors I encouraged her to do so It should not interfere with her treatment for IV iron and B12 replacement therapy   No orders of the defined types were placed in this encounter.   The total time spent in the appointment was 20 minutes encounter with patients including review of chart and various tests results, discussions about plan of care and coordination of care plan   All questions were answered. The patient knows to call the clinic with any problems, questions or concerns. No barriers to learning was detected.    Heath Lark, MD 4/25/20221:32 PM  INTERVAL HISTORY: [name] 60 y.o. female returns for further follow-up She complained of excessive fatigue She was recently found to have B12 deficiency anemia and has been receiving B12 replacement therapy through her primary care doctor's office The patient  denies any recent signs or symptoms of bleeding such as spontaneous epistaxis, hematuria or hematochezia. She is interested to go on a special diet plan to lose weight  SUMMARY OF HEMATOLOGIC HISTORY: [name] 60 y.o. female is here because of recent progressive anemia She is here accompanied by her husband I have the opportunity to review her blood test results from 2015 The patient has mild anemia over the last few years She has multiple chronic disease such as sarcoidosis, diabetes, congestive heart failure and others.   She has history of fundoplication.  She has multiple EGDs in the past, the last was in 2019 which showed no evidence of stomach ulcer or gastritis She takes chronic proton pump inhibitor Her hemoglobin stable between 10-11, most consistent with anemia of chronic disease  Around Halloween this year, she fell requiring left leg surgery.  Unfortunately, she developed complications with infection requiring IV antibiotics She also require blood transfusion intermittently According to her husband, advanced home care service has been drawing her blood from her PICC line and noted hemoglobin between 7.2-7.7 She has excessive fatigue This past Sunday, she had postural dizziness leading to a fall but she denies significant injuries She denies recent chest pain on exertion but has been somewhat sedentary since her surgery She was on chronic prednisone therapy but that was discontinued in October.  She use inhalers. She denies shortness of breath on minimal exertions or palpitations. She had not noticed any recent bleeding such as epistaxis, hematuria or hematochezia The patient denies over the counter NSAID ingestion. She is on antiplatelets agents and Lovenox since surgery  She had no prior history or diagnosis of cancer. Her age appropriate screening programs are up-to-date. She denies any pica and eats a variety of diet. She never donated blood  The patient was  prescribed oral iron supplements and she takes 2 times a day and she tolerated that poorly.  She has significant nausea She received 2 doses of IV iron sucrose in January 2022 Starting in March, 2022, she started to receive B12 injections  I have reviewed the past medical history, past surgical history, social history and family history with the patient and they are unchanged from previous note.  ALLERGIES:  is allergic to methotrexate, vancomycin, lisinopril, chlorhexidine, clindamycin/lincomycin, doxycycline, and teflaro [ceftaroline].  MEDICATIONS:  Current Outpatient Medications  Medication Sig Dispense Refill  . albuterol (PROVENTIL HFA;VENTOLIN HFA) 108 (90 Base) MCG/ACT inhaler Inhale 1-2 puffs into the lungs every 6 (six) hours as needed for wheezing or shortness of breath. 8 g 5  . allopurinol (ZYLOPRIM) 100 MG tablet Take 100 mg by mouth every evening.     [name] aspirin EC 81 MG tablet Take 1 tablet (81 mg total) by mouth daily. 30 tablet 11  . atorvastatin (LIPITOR) 40 MG tablet TAKE 1 TABLET BY MOUTH EVERY DAY 90 tablet 3  . BREO ELLIPTA 200-25 MCG/INH AEPB TAKE 1 PUFF BY MOUTH EVERY DAY 60 each 5  . buPROPion (WELLBUTRIN XL) 300 MG 24 hr tablet TAKE 1 TABLET BY MOUTH EVERY DAY 90 tablet 3  . carvedilol (COREG) 12.5 MG tablet Take 12.5 mg by mouth 2 (two) times daily with a meal.    . chlorpheniramine-HYDROcodone (TUSSIONEX PENNKINETIC ER) 10-8 MG/5ML SUER Take 5 mLs by mouth at bedtime as needed for cough. 70 mL 0  . Cholecalciferol 25 MCG (1000 UT) capsule Take 1,000 Units by mouth in the morning and at bedtime.    [name] DEXILANT 60 MG capsule TAKE 1 CAPSULE BY MOUTH EVERY DAY 90 capsule 3  . diclofenac Sodium (VOLTAREN) 1 % GEL Apply 1 application topically as needed (pain.).     [name] diltiazem (CARDIZEM CD) 120 MG 24 hr capsule Take 1 capsule (120 mg total) by mouth daily. 90 capsule 3  . Dulaglutide (TRULICITY) 3 0000000 SOPN Inject 0.5 mLs (3 mg total) into the skin once a week. 12 pen  3  . famotidine (PEPCID) 20 MG tablet Take 1 tablet (20 mg total) by mouth at bedtime. 30 tablet 3  . fenofibrate 160 MG tablet TAKE 1 TABLET BY MOUTH EVERY DAY 90 tablet 3  . fluticasone (FLONASE) 50 MCG/ACT nasal spray Place 2 sprays into both nostrils daily as needed for allergies. 48 mL 3  . furosemide (LASIX) 40 MG tablet Take 1 tablet (40 mg total) by mouth daily. 180 tablet 1  . gabapentin (NEURONTIN) 100 MG capsule TAKE 1 CAPSULE BY MOUTH THREE TIMES A DAY 270 capsule 3  . glucose blood (CONTOUR NEXT TEST) test strip 1 each by Other route 4 (four) times daily. And lancets 4/day 400 each 3  . HYDROcodone-acetaminophen (NORCO/VICODIN) 5-325 MG tablet Take 1 tablet by mouth every 6 (six) hours as needed. 15 tablet 0  . insulin glargine (LANTUS SOLOSTAR) 100 UNIT/ML Solostar Pen Inject 25 Units into the skin every morning. 30 mL 2  . insulin lispro (HUMALOG KWIKPEN) 100 UNIT/ML KwikPen Inject 0.15 mLs (15 Units total) into the skin daily with supper. 15 mL 29  . Insulin Pen Needle (PEN NEEDLES) 32G X 4 MM MISC 1 each by Does not apply route  4 (four) times daily. E11.9 400 each 0  . ipratropium (ATROVENT) 0.03 % nasal spray Place 2 sprays into both nostrils every 12 (twelve) hours. 30 mL 2  . KLOR-CON M20 20 MEQ tablet TAKE 1 & 1/2 TABLETS BY MOUTH TWICE A DAY 270 tablet 2  . lactulose (CHRONULAC) 10 GM/15ML solution TAKE 15 MLS BY MOUTH DAILY AS NEEDED FOR CONSTIPATION. 1419 mL 1  . Lancet Devices (EASY MINI EJECT LANCING DEVICE) MISC Use as directed 4 times daily to test blood sugar 1 each 3  . linaclotide (LINZESS) 290 MCG CAPS capsule Take by mouth as needed.    [name] LORazepam (ATIVAN) 0.5 MG tablet Take 0.5 mg by mouth every 12 (twelve) hours as needed for anxiety.    . meloxicam (MOBIC) 15 MG tablet Take 15 mg by mouth daily.    . metFORMIN (GLUCOPHAGE) 1000 MG tablet Take 1 tablet (1,000 mg total) by mouth 2 (two) times daily with a meal. 180 tablet 1  . Microlet Lancets MISC 1 each by Does  not apply route 2 (two) times daily. E11.9 100 each 2  . naproxen (NAPROSYN) 500 MG tablet Take 500 mg by mouth 2 (two) times daily.    . nitroGLYCERIN (NITROSTAT) 0.4 MG SL tablet Place 1 tablet (0.4 mg total) under the tongue every 5 (five) minutes as needed for chest pain. Reported on 01/05/2016 25 tablet 6  . ondansetron (ZOFRAN-ODT) 4 MG disintegrating tablet Take 1 tablet (4 mg total) by mouth every 8 (eight) hours as needed for nausea or vomiting. 50 tablet 2  . telmisartan (MICARDIS) 20 MG tablet TAKE 1 TABLET BY MOUTH EVERY DAY 90 tablet 1  . tiZANidine (ZANAFLEX) 4 MG tablet Take 0.5-1 tablets (2-4 mg total) by mouth every 8 (eight) hours as needed for muscle spasms. 30 tablet 1  . venlafaxine XR (EFFEXOR-XR) 75 MG 24 hr capsule TAKE 1 CAPSULE BY MOUTH TWICE A DAY 180 capsule 3   No current facility-administered medications for this visit.   Facility-Administered Medications Ordered in Other Visits  Medication Dose Route Frequency Provider Last Rate Last Admin  . iron sucrose (VENOFER) 300 mg in sodium chloride 0.9 % 250 mL IVPB  300 mg Intravenous Once Alvy Bimler, Breccan Galant, MD 176.7 mL/hr at 02/02/21 1249 300 mg at 02/02/21 1249     REVIEW OF SYSTEMS:   Constitutional: Denies fevers, chills or night sweats Eyes: Denies blurriness of vision Ears, nose, mouth, throat, and face: Denies mucositis or sore throat Respiratory: Denies cough, dyspnea or wheezes Cardiovascular: Denies palpitation, chest discomfort or lower extremity swelling Gastrointestinal:  Denies nausea, heartburn or change in bowel habits Skin: Denies abnormal skin rashes Lymphatics: Denies new lymphadenopathy or easy bruising Neurological:Denies numbness, tingling or new weaknesses Behavioral/Psych: Mood is stable, no new changes  All other systems were reviewed with the patient and are negative.  PHYSICAL EXAMINATION: ECOG PERFORMANCE STATUS: 1 - Symptomatic but completely ambulatory  Vitals:   02/02/21 1139  BP: (!)  145/59  Pulse: 69  Resp: 18  Temp: 97.9 F (36.6 C)  SpO2: 100%   Filed Weights   02/02/21 1139  Weight: 220 lb 3.2 oz (99.9 kg)    GENERAL:alert, no distress and comfortable NEURO: alert & oriented x 3 with fluent speech, no focal motor/sensory deficits  LABORATORY DATA:  I have reviewed the data as listed     Component Value Date/Time   NA 141 02/02/2021 1118   NA 139 10/20/2020 0000   NA 135 (L) 04/17/2014 1039  K 5.1 02/02/2021 1118   K 4.5 04/17/2014 1039   CL 105 02/02/2021 1118   CO2 28 02/02/2021 1118   CO2 26 04/17/2014 1039   GLUCOSE 138 (H) 02/02/2021 1118   GLUCOSE 300 (H) 04/17/2014 1039   GLUCOSE 150 (H) 09/14/2006 1033   BUN 16 02/02/2021 1118   BUN 10 10/20/2020 0000   BUN 13.9 04/17/2014 1039   CREATININE 1.03 (H) 02/02/2021 1118   CREATININE 0.88 08/21/2020 1404   CREATININE 0.8 04/17/2014 1039   CALCIUM 9.3 02/02/2021 1118   CALCIUM 10.0 04/17/2014 1039   PROT 7.0 02/02/2021 1118   PROT 6.7 03/31/2020 1316   PROT 6.8 04/17/2014 1039   ALBUMIN 3.9 02/02/2021 1118   ALBUMIN 4.6 03/31/2020 1316   ALBUMIN 3.7 04/17/2014 1039   AST 21 02/02/2021 1118   AST 8 04/17/2014 1039   ALT 16 02/02/2021 1118   ALT 20 04/17/2014 1039   ALKPHOS 81 02/02/2021 1118   ALKPHOS 82 04/17/2014 1039   BILITOT 0.5 02/02/2021 1118   BILITOT 0.3 03/31/2020 1316   BILITOT 0.45 04/17/2014 1039   GFRNONAA >60 02/02/2021 1118   GFRNONAA 71 08/21/2020 1404   GFRAA 96 10/20/2020 0000   GFRAA 82 08/21/2020 1404    No results found for: SPEP, UPEP  Lab Results  Component Value Date   WBC 8.5 02/02/2021   NEUTROABS 5.4 02/02/2021   HGB 10.6 (L) 02/02/2021   HCT 33.4 (L) 02/02/2021   MCV 90.3 02/02/2021   PLT 317 02/02/2021      Chemistry      Component Value Date/Time   NA 141 02/02/2021 1118   NA 139 10/20/2020 0000   NA 135 (L) 04/17/2014 1039   K 5.1 02/02/2021 1118   K 4.5 04/17/2014 1039   CL 105 02/02/2021 1118   CO2 28 02/02/2021 1118   CO2 26  04/17/2014 1039   BUN 16 02/02/2021 1118   BUN 10 10/20/2020 0000   BUN 13.9 04/17/2014 1039   CREATININE 1.03 (H) 02/02/2021 1118   CREATININE 0.88 08/21/2020 1404   CREATININE 0.8 04/17/2014 1039   GLU 223 10/20/2020 0000      Component Value Date/Time   CALCIUM 9.3 02/02/2021 1118   CALCIUM 10.0 04/17/2014 1039   ALKPHOS 81 02/02/2021 1118   ALKPHOS 82 04/17/2014 1039   AST 21 02/02/2021 1118   AST 8 04/17/2014 1039   ALT 16 02/02/2021 1118   ALT 20 04/17/2014 1039   BILITOT 0.5 02/02/2021 1118   BILITOT 0.3 03/31/2020 1316   BILITOT 0.45 04/17/2014 1039

## 2021-02-02 NOTE — Assessment & Plan Note (Signed)
She has intermittent chronic kidney disease secondary to cardiac issues and diabetes ?She will continue aggressive risk factor modification ?There could be a component of anemia chronic kidney disease but with her current level of hemoglobin, she does not need ESA ?

## 2021-02-02 NOTE — Assessment & Plan Note (Signed)
I have reviewed blood work with the patient and her husband Overall, she has multifactorial anemia, combination of iron deficiency as well as B12 deficiency She is receiving B12 supplements through her primary care doctor's office She will receive 1 dose of iron sucrose today At the end of the day, her iron studies reveal adequate iron replacement except for borderline low saturation which will hopefully resolve with 1 dose of IV iron Plan to see her again in 3 months for further follow-up

## 2021-02-02 NOTE — Patient Instructions (Signed)

## 2021-02-03 ENCOUNTER — Telehealth: Payer: Self-pay | Admitting: Hematology and Oncology

## 2021-02-03 NOTE — Telephone Encounter (Signed)
Scheduled per 4/25 sch msg. Called and spoke with pt confirmed 7/25 appts

## 2021-02-04 ENCOUNTER — Ambulatory Visit: Payer: 59 | Admitting: Endocrinology

## 2021-02-05 ENCOUNTER — Ambulatory Visit (INDEPENDENT_AMBULATORY_CARE_PROVIDER_SITE_OTHER): Payer: 59

## 2021-02-05 ENCOUNTER — Other Ambulatory Visit: Payer: Self-pay

## 2021-02-05 DIAGNOSIS — E538 Deficiency of other specified B group vitamins: Secondary | ICD-10-CM

## 2021-02-05 MED ORDER — CYANOCOBALAMIN 1000 MCG/ML IJ SOLN
1000.0000 ug | Freq: Once | INTRAMUSCULAR | Status: AC
  Administered 2021-02-05: 1000 ug via INTRAMUSCULAR

## 2021-02-05 NOTE — Progress Notes (Signed)
[name] 60 year old female presents in office today for B12 injection per Garret Reddish, MD. Administered CYANOCOBALAMIN 1,000mg  IM left deltoid. Patient tolerated injection well, and is aware to return next month for next injection.

## 2021-02-06 ENCOUNTER — Ambulatory Visit: Payer: 59

## 2021-02-09 ENCOUNTER — Ambulatory Visit (INDEPENDENT_AMBULATORY_CARE_PROVIDER_SITE_OTHER): Payer: 59

## 2021-02-09 ENCOUNTER — Other Ambulatory Visit: Payer: Self-pay

## 2021-02-09 VITALS — BP 118/68 | HR 84 | Temp 97.3°F | Wt 222.6 lb

## 2021-02-09 DIAGNOSIS — Z0000 Encounter for general adult medical examination without abnormal findings: Secondary | ICD-10-CM | POA: Diagnosis not present

## 2021-02-09 NOTE — Progress Notes (Signed)
Subjective:   [name] is a 60 y.o. female who presents for Medicare Annual (Subsequent) preventive examination.  Review of Systems     Cardiac Risk Factors include: advanced age (>60men, >55 women);diabetes mellitus;dyslipidemia;hypertension;obesity (BMI >30kg/m2)     Objective:    Today's Vitals   02/09/21 0936  BP: 118/68  Pulse: 84  Temp: (!) 97.3 F (36.3 C)  SpO2: 94%  Weight: 222 lb 9.6 oz (101 kg)   Body mass index is 35.93 kg/m.  Advanced Directives 02/09/2021 01/06/2021 11/12/2020 02/11/2020 02/01/2020 10/18/2019 08/14/2019  Does Patient Have a Medical Advance Directive? Yes No No Yes Yes No No  Type of Academic librarian - - Healthcare Power of [place];Living will Living will;Healthcare Power of Attorney - -  Does patient want to make changes to medical advance directive? - - - - No - Patient declined - -  Copy of [place] in Chart? No - copy requested - - - No - copy requested - -  Would patient like information on creating a medical advance directive? - No - Patient declined No - Patient declined - - No - Patient declined No - Patient declined  Pre-existing out of facility DNR order (yellow form or pink MOST form) - - - - - - -    Current Medications (verified) Outpatient Encounter Medications as of 02/09/2021  Medication Sig  . albuterol (PROVENTIL HFA;VENTOLIN HFA) 108 (90 Base) MCG/ACT inhaler Inhale 1-2 puffs into the lungs every 6 (six) hours as needed for wheezing or shortness of breath.  . allopurinol (ZYLOPRIM) 100 MG tablet Take 100 mg by mouth every evening.   [name] aspirin EC 81 MG tablet Take 1 tablet (81 mg total) by mouth daily.  [name] atorvastatin (LIPITOR) 40 MG tablet TAKE 1 TABLET BY MOUTH EVERY DAY  . BREO ELLIPTA 200-25 MCG/INH AEPB TAKE 1 PUFF BY MOUTH EVERY DAY  . buPROPion (WELLBUTRIN XL) 300 MG 24 hr tablet TAKE 1 TABLET BY MOUTH EVERY DAY  . carvedilol (COREG) 12.5 MG tablet Take 12.5 mg by mouth 2  (two) times daily with a meal.  . chlorpheniramine-HYDROcodone (TUSSIONEX PENNKINETIC ER) 10-8 MG/5ML SUER Take 5 mLs by mouth at bedtime as needed for cough.  . Cholecalciferol 25 MCG (1000 UT) capsule Take 1,000 Units by mouth in the morning and at bedtime.  [name] DEXILANT 60 MG capsule TAKE 1 CAPSULE BY MOUTH EVERY DAY  . diclofenac Sodium (VOLTAREN) 1 % GEL Apply 1 application topically as needed (pain.).   [name] diltiazem (CARDIZEM CD) 120 MG 24 hr capsule Take 1 capsule (120 mg total) by mouth daily.  . Dulaglutide (TRULICITY) 3 ZY/6.0YT SOPN Inject 0.5 mLs (3 mg total) into the skin once a week.  . famotidine (PEPCID) 20 MG tablet Take 1 tablet (20 mg total) by mouth at bedtime.  . fenofibrate 160 MG tablet TAKE 1 TABLET BY MOUTH EVERY DAY  . fluticasone (FLONASE) 50 MCG/ACT nasal spray Place 2 sprays into both nostrils daily as needed for allergies.  . furosemide (LASIX) 40 MG tablet Take 1 tablet (40 mg total) by mouth daily.  [name] gabapentin (NEURONTIN) 100 MG capsule TAKE 1 CAPSULE BY MOUTH THREE TIMES A DAY  . glucose blood (CONTOUR NEXT TEST) test strip 1 each by Other route 4 (four) times daily. And lancets 4/day  . HYDROcodone-acetaminophen (NORCO/VICODIN) 5-325 MG tablet Take 1 tablet by mouth every 6 (six) hours as needed.  . insulin glargine (LANTUS SOLOSTAR) 100  UNIT/ML Solostar Pen Inject 25 Units into the skin every morning.  . insulin lispro (HUMALOG KWIKPEN) 100 UNIT/ML KwikPen Inject 0.15 mLs (15 Units total) into the skin daily with supper.  . Insulin Pen Needle (PEN NEEDLES) 32G X 4 MM MISC 1 each by Does not apply route 4 (four) times daily. E11.9  . ipratropium (ATROVENT) 0.03 % nasal spray Place 2 sprays into both nostrils every 12 (twelve) hours.  [name] KLOR-CON M20 20 MEQ tablet TAKE 1 & 1/2 TABLETS BY MOUTH TWICE A DAY  . lactulose (CHRONULAC) 10 GM/15ML solution TAKE 15 MLS BY MOUTH DAILY AS NEEDED FOR CONSTIPATION.  [name] Lancet Devices (EASY MINI EJECT LANCING DEVICE) MISC Use as  directed 4 times daily to test blood sugar  . LORazepam (ATIVAN) 0.5 MG tablet Take 0.5 mg by mouth every 12 (twelve) hours as needed for anxiety.  . meloxicam (MOBIC) 15 MG tablet Take 15 mg by mouth daily.  . metFORMIN (GLUCOPHAGE) 1000 MG tablet Take 1 tablet (1,000 mg total) by mouth 2 (two) times daily with a meal.  . Microlet Lancets MISC 1 each by Does not apply route 2 (two) times daily. E11.9  . naproxen (NAPROSYN) 500 MG tablet Take 500 mg by mouth 2 (two) times daily.  . ondansetron (ZOFRAN-ODT) 4 MG disintegrating tablet Take 1 tablet (4 mg total) by mouth every 8 (eight) hours as needed for nausea or vomiting.  [name] telmisartan (MICARDIS) 20 MG tablet TAKE 1 TABLET BY MOUTH EVERY DAY  . tiZANidine (ZANAFLEX) 4 MG tablet Take 0.5-1 tablets (2-4 mg total) by mouth every 8 (eight) hours as needed for muscle spasms.  [name] venlafaxine XR (EFFEXOR-XR) 75 MG 24 hr capsule TAKE 1 CAPSULE BY MOUTH TWICE A DAY  . linaclotide (LINZESS) 290 MCG CAPS capsule Take by mouth as needed.  . nitroGLYCERIN (NITROSTAT) 0.4 MG SL tablet Place 1 tablet (0.4 mg total) under the tongue every 5 (five) minutes as needed for chest pain. Reported on 01/05/2016 (Patient not taking: Reported on 02/09/2021)  . nystatin (MYCOSTATIN) 100000 UNIT/ML suspension nystatin 100,000 unit/mL oral suspension  SWISH, GARGLE, AND SPIT 20 ML 4 TIMES A DAY UNTIL RESOLVED  . [DISCONTINUED] mupirocin nasal ointment (BACTROBAN) 2 % Place 1 application into the nose 2 (two) times daily. Use one-half of tube in each nostril twice daily for five (5) days. After application, press sides of nose together and gently massage.   No facility-administered encounter medications on file as of 02/09/2021.    Allergies (verified) Methotrexate, Vancomycin, Lisinopril, Chlorhexidine, Clindamycin/lincomycin, Doxycycline, and Teflaro [ceftaroline]   History: Past Medical History:  Diagnosis Date  . Abnormal SPEP 04/17/2014  . Acute left ankle pain 01/26/2017   . ANEMIA-UNSPECIFIED 09/18/2009  . Anxiety   . Arthritis   . Blood transfusion without reported diagnosis   . Cataract   . CHF (congestive heart failure) ([place])   . Chronic diastolic heart failure, NYHA class 2 (HCC)    Normal LVEDP by May 2018  . COPD (chronic obstructive pulmonary disease) ([place])   . Depression   . DIABETES MELLITUS, TYPE II 08/21/2006  . Diabetic osteomyelitis ([place]) 05/29/2015  . Fatty liver   . Fracture of 5th metatarsal    non union  . GERD 32/95/1884   Had fundoplication  . GOUT 08/20/2010  . Hammer toe of right foot    2-5th toes  . Hx of umbilical hernia repair   . HYPERLIPIDEMIA 08/21/2006  . HYPERTENSION 08/21/2006  . Infection of wound due to  methicillin resistant Staphylococcus aureus (MRSA)   . Internal hemorrhoids   . Kidney problem   . Multiple allergies 10/14/2016  . OBESITY 06/04/2009  . Onychomycosis 10/27/2015  . Osteomyelitis of left foot ([place]) 05/29/2015  . Osteoporosis   . Pulmonary sarcoidosis ([place])    Followed locally by pulmonology, but also by Dr. Casper Harrison at [facility]  . Right knee pain 01/26/2017  . Vocal cord dysfunction   . Wears partial dentures    Past Surgical History:  Procedure Laterality Date  . ABDOMINAL HYSTERECTOMY    . APPENDECTOMY    . BLADDER SUSPENSION  11/11/2011   Procedure: TRANSVAGINAL TAPE (TVT) PROCEDURE;  Surgeon: Olga Millers, MD;  Location: [facility];  Service: Gynecology;  Laterality: N/A;  . CAROTIDS  02/18/11   CAROTID DUPLEX; VERTEBRALS ARE PATENT WITH ANTEGRADE FLOW. ICA/CCA RATIO 1.61 ON RIGHT AND 0.75 ON LEFT  . CATARACT EXTRACTION, BILATERAL    . CHOLECYSTECTOMY  1984  . COLONOSCOPY  04/29/2010   [name]  . CYSTOSCOPY  11/11/2011   Procedure: CYSTOSCOPY;  Surgeon: Olga Millers, MD;  Location: [facility];  Service: Gynecology;  Laterality: N/A;  . EXTUBATION (ENDOTRACHEAL) IN OR N/A 09/23/2017   Procedure: EXTUBATION (ENDOTRACHEAL) IN OR;  Surgeon: Helayne Seminole, MD;  Location: [place];   Service: ENT;  Laterality: N/A;  . FIBEROPTIC LARYNGOSCOPY AND TRACHEOSCOPY N/A 09/23/2017   Procedure: FLEXIBLE FIBEROPTIC LARYNGOSCOPY;  Surgeon: Helayne Seminole, MD;  Location: [place];  Service: ENT;  Laterality: N/A;  . FRACTURE SURGERY     foot  . HALLUX FUSION Left 10/02/2018   Procedure: HALLUX ARTHRODESIS;  Surgeon: Edrick Kins, DPM;  Location: [facility];  Service: Podiatry;  Laterality: Left;  . HAMMER TOE SURGERY Left 10/02/2018   Procedure: HAMMER TOE CORRECTION 2ND 3RD 4RD FIFTH TOE;  Surgeon: Edrick Kins, DPM;  Location: [facility];  Service: Podiatry;  Laterality: Left;  . HAMMER TOE SURGERY Right 04/12/2019   Procedure: HAMMER TOE CORRECTION, 2ND, 3RD, 4TH AND 5TH TOES OF RIGHT FOOT;  Surgeon: Edrick Kins, DPM;  Location: [place];  Service: Podiatry;  Laterality: Right;  . HAMMER TOE SURGERY Left 10/25/2019   Procedure: HAMMER TOE REPAIR SECOND, THIRD, FOUTH;  Surgeon: Edrick Kins, DPM;  Location: [facility];  Service: Podiatry;  Laterality: Left;  . HERNIA REPAIR    . I & D EXTREMITY Left 06/27/2015   Procedure: Partial Excision Left Calcaneus, Place Antibiotic Beads, and Wound VAC;  Surgeon: Newt Minion, MD;  Location: [facility];  Service: Orthopedics;  Laterality: Left;  . JOINT REPLACEMENT    . KNEE ARTHROSCOPY     right  . LEFT AND RIGHT HEART CATHETERIZATION WITH CORONARY ANGIOGRAM N/A 04/23/2013   Procedure: LEFT AND RIGHT HEART CATHETERIZATION WITH CORONARY ANGIOGRAM;  Surgeon: Leonie Man, MD;  Location: [facility];  Service: Cardiovascular;  Laterality: N/A;  . LEFT HEART CATH AND CORONARY ANGIOGRAPHY N/A 03/11/2017   Procedure: Left Heart Cath and Coronary Angiography;  Surgeon: Sherren Mocha, MD;  Location: [facility]; angiographically minimal CAD in the LAD otherwise normal.;  Normal LVEDP.  FALSE POSITIVE MYOVIEW  . LEFT HEART CATH AND CORONARY ANGIOGRAPHY  07/20/2010   LVEF 50-55% WITH VERY MILD GLOBAL HYPOKINESIA; ESSENTIALLY NORMAL CORONARY ARTERIES;  NORMAL LV FUNCTION  . METATARSAL OSTEOTOMY WITH OPEN REDUCTION INTERNAL FIXATION (ORIF) METATARSAL WITH FUSION Left 04/09/2014   Procedure: LEFT FOOT FRACTURE OPEN TREATMENT METATARSAL INCLUDES INTERNAL FIXATION EACH;  Surgeon:  Lorn Junes, MD;  Location: [place];  Service: Orthopedics;  Laterality: Left;  . NISSEN FUNDOPLICATION  2992  . NM MYOVIEW LTD --> FALSE POSITIVE  03/10/2017   : Moderate size "stress-induced "perfusion defect at the apex as well as "ill-defined stress-induced perfusion defect in the lateral wall.  EF 55%.  INTERMEDIATE risk. -->  FALSE POSITIVE  . ORIF DISTAL FEMUR FRACTURE  08/08/2020   [facility]: ORIF of left extra-articular periprosthetic distal femur fracture with synthesis of the femur plate with cortical nonlocking screws.  (Thought to be pathologic fracture due to osteoporosis) -> after a fall  . ORIF FEMUR W/ PERI-IMPLANT  08/29/2020   [facility]) revision of left femur fracture ORIF plate fixation screws; culture positive for MSSA  . Right and left CARDIAC CATHETERIZATION  04/23/2013   Angiographic normal coronaries; LVEDP 20 mmHg, PCWP 12-14 mmHg, RAP 12 mmHg.; Fick CO/CI 4.9/2.2  . SHOULDER ARTHROSCOPY Left 03/14/2019   Procedure: LEFT SHOULDER ARTHROSCOPY, DEBRIDEMENT, AND DECOMPRESSION;  Surgeon: Newt Minion, MD;  Location: [place];  Service: Orthopedics;  Laterality: Left;  . TOTAL KNEE ARTHROPLASTY Right 06/29/2017   Procedure: RIGHT TOTAL KNEE ARTHROPLASTY;  Surgeon: Newt Minion, MD;  Location: [place];  Service: Orthopedics;  Laterality: Right;  . TOTAL KNEE ARTHROPLASTY Left 12/07/2017  . TOTAL KNEE ARTHROPLASTY Left 12/07/2017   Procedure: LEFT TOTAL KNEE ARTHROPLASTY;  Surgeon: Newt Minion, MD;  Location: [place];  Service: Orthopedics;  Laterality: Left;  . TRACHEOSTOMY TUBE PLACEMENT N/A 09/20/2017   Procedure: AWAKE INTUBATION WITH ANESTHESIA WITH VIDEO ASSISTANCE;  Surgeon: Helayne Seminole, MD;   Location: [place] [facility];  Service: ENT;  Laterality: N/A;  . TRANSTHORACIC ECHOCARDIOGRAM  08/2014   Normal LV size and function.  Mild LVH.  EF 55-60%.  Normal regional wall motion.  GR 1 DD.  Normal RV size and function .  [name] TRANSTHORACIC ECHOCARDIOGRAM  08/12/2020    [facility]): Normal LV size and function.  EF 55 to 60%.  No RWM A.  Normal filling pattern.  Normal RV.  No significant valvular disease-stenosis or regurgitation.  No vegetation.  . TUBAL LIGATION     with reversal in 1994  . UPPER GASTROINTESTINAL ENDOSCOPY    . VENTRAL HERNIA REPAIR     Family History  Problem Relation Age of Onset  . Diabetes Father   . Heart attack Father 75  . Coronary artery disease Father   . Heart failure Father   . Throat cancer Father   . Hypertension Father   . Heart disease Father   . Sleep apnea Father   . Obesity Father   . COPD Mother   . Emphysema Mother   . Asthma Mother   . Heart failure Mother   . Breast cancer Mother   . Diabetes Mother   . Kidney disease Mother   . Thyroid disease Mother   . Heart attack Maternal Grandfather   . Sarcoidosis Maternal Uncle   . Lung cancer Brother   . Diabetes Brother   . Colon cancer Neg Hx   . Colon polyps Neg Hx   . Esophageal cancer Neg Hx   . Rectal cancer Neg Hx   . Stomach cancer Neg Hx    Social History   Socioeconomic History  . Marital status: Married    Spouse name: SHYLIE POLO  . Number of children: 2  . Years of education: 59  . Highest education  level: Not on file  Occupational History  . Occupation: DISABLED  Tobacco Use  . Smoking status: Never Smoker  . Smokeless tobacco: Never Used  Vaping Use  . Vaping Use: Never used  Substance and Sexual Activity  . Alcohol use: No    Alcohol/week: 0.0 standard drinks  . Drug use: No  . Sexual activity: Yes    Birth control/protection: Surgical  Other Topics Concern  . Not on file  Social History Narrative   Married 1994. 2 sons who both live close and 1  grandson.       Disability due to sarcoidosis. 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 Difficulty of Paying Living Expenses: Not hard at all  Food Insecurity: No Food Insecurity  . Worried About Charity fundraiser in the Last Year: Never true  . Ran Out of Food in the Last Year: Never true  Transportation Needs: No Transportation Needs  . Lack of Transportation (Medical): No  . Lack of Transportation (Non-Medical): No  Physical Activity: Insufficiently Active  . Days of Exercise per Week: 3 days  . Minutes of Exercise per Session: 30 min  Stress: No Stress Concern Present  . Feeling of Stress : Not at all  Social Connections: Moderately Integrated  . Frequency of Communication with Friends and Family: More than three times a week  . Frequency of Social Gatherings with Friends and Family: More than three times a week  . Attends Religious Services: More than 4 times per year  . Active Member of Clubs or Organizations: No  . Attends Archivist Meetings: Never  . Marital Status: Married    Tobacco Counseling Counseling given: Not Answered   Clinical Intake:  Pre-visit preparation completed: Yes        BMI - recorded: 35.93 Nutritional Status: BMI > 30  Obese Nutritional Risks: None Diabetes: Yes CBG done?: No Did pt. bring in CBG monitor from home?: No  How often do you need to have someone help you when you read instructions, pamphlets, or other written materials from your doctor or pharmacy?: 1 - Never  Diabetic?Nutrition Risk Assessment:  Has the patient had any N/V/D within the last 2 months?  No  Does the patient have any non-healing wounds?  No  Has the patient had any unintentional weight loss or weight gain?  No   Diabetes:  Is the  patient diabetic?  Yes  If diabetic, was a CBG obtained today?  No  Did the patient bring in their glucometer from home?  No  How often do you monitor your CBG's? Daily    Financial Strains and Diabetes Management:  Are you having any financial strains with the device, your supplies or your medication? No .  Does the patient want to be seen by Chronic Care Management for management of their diabetes?  No  Would the patient like to be referred to a Nutritionist or for Diabetic Management?  No   Diabetic Exams:  Diabetic Eye Exam: Overdue for diabetic eye exam. Pt has been advised about the importance in completing this exam. Patient advised to call and schedule an eye exam. Diabetic Foot Exam: Completed 09/12/20   Interpreter Needed?: No  Information entered by :: Charlott Rakes, LPN  Activities of Daily Living In your present state of health, do you have any difficulty performing the following activities: 02/09/2021  Hearing? N  Vision? N  Difficulty concentrating or making decisions? N  Walking or climbing stairs? N  Dressing or bathing? N  Doing errands, shopping? N  Preparing Food and eating ? N  Using the Toilet? N  In the past six months, have you accidently leaked urine? N  Do you have problems with loss of bowel control? N  Managing your Medications? N  Managing your Finances? N  Housekeeping or managing your Housekeeping? N  Some recent data might be hidden    Patient Care Team: [name], MD as PCP - General (Family Medicine) Leonie Man, MD as PCP - Cardiology (Cardiology) Elsie Stain, MD (Pulmonary Disease) Elsie Saas, MD as Consulting Physician (Orthopedic Surgery) Wenda Low, MD as Referring Physician (Pulmonary Disease) Katy Apo, MD as Consulting Physician (Ophthalmology) Lyndee Hensen, PT as Physical Therapist (Physical Therapy) Jonelle Sidle, MD as Consulting Physician (Obstetrics and Gynecology) Edrick Kins, DPM as Consulting Physician (Podiatry)  Indicate any recent Medical Services you may have received from other than [facility] providers in the past year (date may be approximate).     Assessment:   This is a routine wellness examination for [place].  Hearing/Vision screen  Hearing Screening   125Hz  250Hz  500Hz  1000Hz  2000Hz  3000Hz  4000Hz  6000Hz  8000Hz   Right ear:           Left ear:           Comments: Pt denies any hearing issues   Vision Screening Comments: Pt follows up with Dr  Prudencio Burly for annual eye exams   Dietary issues and exercise activities discussed: Current Exercise Habits: Home exercise routine, Type of exercise: walking, Time (Minutes): 30, Frequency (Times/Week): 3, Weekly Exercise (Minutes/Week): 90  Goals Addressed            This Visit's Progress   . patient       Lose weight       Depression Screen PHQ 2/9 Scores 02/09/2021 01/01/2021 10/21/2020 08/21/2020 04/07/2020 03/31/2020 02/14/2020  PHQ - 2 Score 0 0 0 0 0 1 0  PHQ- 9 Score - 3 - 0 0 2 0  Exception Documentation - - - - - Medical reason -    Fall Risk Fall Risk  02/09/2021 02/01/2020 07/28/2018 06/16/2017 01/26/2017  Falls in the past year? 1 0 No No No  Number falls in past yr: 1 0 - - -  Injury with Fall? 1 0 - - -  Comment left knee injury - - - -  Risk for fall due to : Impaired vision - - - -  Follow up Falls prevention discussed Falls evaluation completed;Falls prevention discussed;Education provided - - -    FALL RISK PREVENTION PERTAINING TO THE HOME:  Any stairs in or around the home? Yes  If so, are there any without handrails? No  Home free of loose throw rugs in walkways, pet beds, electrical cords, etc? Yes  Adequate lighting in your home to reduce risk of falls? Yes   ASSISTIVE DEVICES UTILIZED TO PREVENT FALLS:  Life alert? No  Use of a cane, walker or w/c? No  Grab bars in the bathroom? Yes  Shower chair or bench in shower? No  Elevated toilet seat or a handicapped toilet? Yes    TIMED UP AND GO:  Was the test performed? Yes .  Length of time  to ambulate 10 feet: 10 sec.   Gait steady and fast without use of assistive device  Cognitive Function:     6CIT Screen 02/09/2021 02/01/2020  What Year? 0 points 0 points  What month? 0 points 0 points  What time? - 0 points  Count back from 20 0 points 0 points  Months in reverse 0 points 0 points  Repeat phrase 2 points 0 points  Total Score - 0    Immunizations Immunization History  Administered Date(s) Administered  . H1N1 09/24/2008  . Hep A / Hep B 02/20/2018, 04/03/2018, 08/23/2018  . Influenza Inj Mdck Quad With Preservative 07/12/2019  . Influenza Split 09/24/2008, 07/07/2011, 07/18/2012, 06/12/2013, 07/15/2014, 08/08/2015, 06/22/2016, 07/01/2017, 08/16/2017, 07/14/2018, 07/04/2019, 07/12/2019, 07/26/2019, 07/02/2020  . Influenza Whole 08/15/2007, 06/26/2008, 07/23/2009, 07/16/2010  . Influenza, Seasonal, Injecte, Preservative Fre 10/26/2011, 07/18/2012  . Influenza,inj,Quad PF,6+ Mos 06/12/2013, 07/15/2014, 08/08/2015, 06/22/2016, 07/01/2017, 07/14/2018, 07/04/2019, 07/02/2020  . Influenza-Unspecified 09/24/2008, 07/26/2019  . PFIZER(Purple Top)SARS-COV-2 Vaccination 12/26/2019, 01/16/2020  . Pneumococcal Conjugate-13 05/06/2014  . Pneumococcal Polysaccharide-23 09/30/2008, 02/13/2013  . Td 05/21/2010  . Tdap 07/02/2020  . Zoster Recombinat (Shingrix) 03/02/2017, 05/02/2017    TDAP status: Up to date  Flu Vaccine status: Up to date  Pneumococcal vaccine status: Up to date  Covid-19 vaccine status: Completed vaccines  Qualifies for Shingles Vaccine? Yes   Zostavax completed Yes   Shingrix Completed?: Yes  Screening Tests Health Maintenance  Topic Date Due  . COVID-19 Vaccine (3 - Booster for Pfizer series) 07/17/2020  . OPHTHALMOLOGY EXAM  12/25/2020  . HEMOGLOBIN A1C  03/13/2021  . INFLUENZA VACCINE  05/11/2021  . FOOT EXAM  09/12/2021  . MAMMOGRAM  06/20/2022  . PAP SMEAR-Modifier   10/16/2022  . COLONOSCOPY (Pts 45-64yrs Insurance coverage will need to be confirmed)  01/07/2028  . TETANUS/TDAP  07/02/2030  . PNEUMOCOCCAL POLYSACCHARIDE VACCINE AGE 109-64 HIGH RISK  Completed  . Hepatitis C Screening  Completed  . HIV Screening  Completed  . HPV VACCINES  Aged Out    Health Maintenance  Health Maintenance Due  Topic Date Due  . COVID-19 Vaccine (3 - Booster for Pfizer series) 07/17/2020  . OPHTHALMOLOGY EXAM  12/25/2020    Colorectal cancer screening: Type of screening: Colonoscopy. Completed 01/06/21. Repeat every 7 years  Mammogram status: Completed 06/20/20. Repeat every year  Bone Density status: Completed 11/01/19. Results reflect: Bone density results: NORMAL. Repeat every 0 years.  Additional Screening:  Hepatitis C Screening:  Completed 03/23/16  Vision Screening: Recommended annual ophthalmology exams for early detection of glaucoma and other disorders of the eye. Is the patient up to date with their annual eye exam?  Yes  Who is the provider or what is the name of the office in which the patient attends annual eye exams? Dr Randon Goldsmith  If pt is not established with a provider, would they like to be referred to a provider to establish care? No .   Dental Screening: Recommended annual dental exams for proper oral hygiene  Community Resource Referral / Chronic Care Management: CRR required this visit?  No   CCM required this visit?  No      Plan:     I have personally reviewed and noted the following in the patient's chart:   . Medical and social history . Use of alcohol, tobacco or illicit drugs  . Current medications and supplements including opioid prescriptions.  . Functional ability and status . Nutritional status . Physical activity . Advanced directives . List  of other physicians . Hospitalizations, surgeries, and ER visits in previous 12 months . Vitals . Screenings to include cognitive, depression, and falls . Referrals and  appointments  In addition, I have reviewed and discussed with patient certain preventive protocols, quality metrics, and best practice recommendations. A written personalized care plan for preventive services as well as general preventive health recommendations were provided to patient.     Willette Brace, LPN   [id number]   Nurse Notes: none

## 2021-02-09 NOTE — Patient Instructions (Signed)
[name] , Thank you for taking time to come for your Medicare Wellness Visit. I appreciate your ongoing commitment to your health goals. Please review the following plan we discussed and let me know if I can assist you in the future.   Screening recommendations/referrals: Colonoscopy: Done 01/06/21 Mammogram: Done 06/20/20 Bone Density: Done 11/01/19 Recommended yearly ophthalmology/optometry visit for glaucoma screening and checkup Recommended yearly dental visit for hygiene and checkup  Vaccinations: Influenza vaccine: Up to date Pneumococcal vaccine: Up to date.utd Tdap vaccine: Up to date Shingles vaccine: Completed  03/02/17 & 05/02/17 Covid-19: Completed 12/26/19 & 01/15/17  Advanced directives: Please bring a copy of your health care power of attorney and living will to the office at your convenience.  Conditions/risks identified: Lose weight  Next appointment: Follow up in one year for your annual wellness visit.   Preventive Care 40-64 Years, Female Preventive care refers to lifestyle choices and visits with your health care provider that can promote health and wellness. What does preventive care include?  A yearly physical exam. This is also called an annual well check.  Dental exams once or twice a year.  Routine eye exams. Ask your health care provider how often you should have your eyes checked.  Personal lifestyle choices, including:  Daily care of your teeth and gums.  Regular physical activity.  Eating a healthy diet.  Avoiding tobacco and drug use.  Limiting alcohol use.  Practicing safe sex.  Taking low-dose aspirin daily starting at age 91.  Taking vitamin and mineral supplements as recommended by your health care provider. What happens during an annual well check? The services and screenings done by your health care provider during your annual well check will depend on your age, overall health, lifestyle risk factors, and family history of  disease. Counseling  Your health care provider may ask you questions about your:  Alcohol use.  Tobacco use.  Drug use.  Emotional well-being.  Home and relationship well-being.  Sexual activity.  Eating habits.  Work and work Statistician.  Method of birth control.  Menstrual cycle.  Pregnancy history. Screening  You may have the following tests or measurements:  Height, weight, and BMI.  Blood pressure.  Lipid and cholesterol levels. These may be checked every 5 years, or more frequently if you are over 62 years old.  Skin check.  Lung cancer screening. You may have this screening every year starting at age 72 if you have a 30-pack-year history of smoking and currently smoke or have quit within the past 15 years.  Fecal occult blood test (FOBT) of the stool. You may have this test every year starting at age 24.  Flexible sigmoidoscopy or colonoscopy. You may have a sigmoidoscopy every 5 years or a colonoscopy every 10 years starting at age 101.  Hepatitis C blood test.  Hepatitis B blood test.  Sexually transmitted disease (STD) testing.  Diabetes screening. This is done by checking your blood sugar (glucose) after you have not eaten for a while (fasting). You may have this done every 1-3 years.  Mammogram. This may be done every 1-2 years. Talk to your health care provider about when you should start having regular mammograms. This may depend on whether you have a family history of breast cancer.  BRCA-related cancer screening. This may be done if you have a family history of breast, ovarian, tubal, or peritoneal cancers.  Pelvic exam and Pap test. This may be done every 3 years starting at age 94. Starting  at age 78, this may be done every 5 years if you have a Pap test in combination with an HPV test.  Bone density scan. This is done to screen for osteoporosis. You may have this scan if you are at high risk for osteoporosis. Discuss your test results,  treatment options, and if necessary, the need for more tests with your health care provider. Vaccines  Your health care provider may recommend certain vaccines, such as:  Influenza vaccine. This is recommended every year.  Tetanus, diphtheria, and acellular pertussis (Tdap, Td) vaccine. You may need a Td booster every 10 years.  Zoster vaccine. You may need this after age 31.  Pneumococcal 13-valent conjugate (PCV13) vaccine. You may need this if you have certain conditions and were not previously vaccinated.  Pneumococcal polysaccharide (PPSV23) vaccine. You may need one or two doses if you smoke cigarettes or if you have certain conditions. Talk to your health care provider about which screenings and vaccines you need and how often you need them. This information is not intended to replace advice given to you by your health care provider. Make sure you discuss any questions you have with your health care provider. Document Released: 10/24/2015 Document Revised: 06/16/2016 Document Reviewed: 07/29/2015 Elsevier Interactive Patient Education  2017 [place] Prevention in the Home Falls can cause injuries. They can happen to people of all ages. There are many things you can do to make your home safe and to help prevent falls. What can I do on the outside of my home?  Regularly fix the edges of walkways and driveways and fix any cracks.  Remove anything that might make you trip as you walk through a door, such as a raised step or threshold.  Trim any bushes or trees on the path to your home.  Use bright outdoor lighting.  Clear any walking paths of anything that might make someone trip, such as rocks or tools.  Regularly check to see if handrails are loose or broken. Make sure that both sides of any steps have handrails.  Any raised decks and porches should have guardrails on the edges.  Have any leaves, snow, or ice cleared regularly.  Use sand or salt on walking  paths during winter.  Clean up any spills in your garage right away. This includes oil or grease spills. What can I do in the bathroom?  Use night lights.  Install grab bars by the toilet and in the tub and shower. Do not use towel bars as grab bars.  Use non-skid mats or decals in the tub or shower.  If you need to sit down in the shower, use a plastic, non-slip stool.  Keep the floor dry. Clean up any water that spills on the floor as soon as it happens.  Remove soap buildup in the tub or shower regularly.  Attach bath mats securely with double-sided non-slip rug tape.  Do not have throw rugs and other things on the floor that can make you trip. What can I do in the bedroom?  Use night lights.  Make sure that you have a light by your bed that is easy to reach.  Do not use any sheets or blankets that are too big for your bed. They should not hang down onto the floor.  Have a firm chair that has side arms. You can use this for support while you get dressed.  Do not have throw rugs and other things on the floor  that can make you trip. What can I do in the kitchen?  Clean up any spills right away.  Avoid walking on wet floors.  Keep items that you use a lot in easy-to-reach places.  If you need to reach something above you, use a strong step stool that has a grab bar.  Keep electrical cords out of the way.  Do not use floor polish or wax that makes floors slippery. If you must use wax, use non-skid floor wax.  Do not have throw rugs and other things on the floor that can make you trip. What can I do with my stairs?  Do not leave any items on the stairs.  Make sure that there are handrails on both sides of the stairs and use them. Fix handrails that are broken or loose. Make sure that handrails are as long as the stairways.  Check any carpeting to make sure that it is firmly attached to the stairs. Fix any carpet that is loose or worn.  Avoid having throw rugs at  the top or bottom of the stairs. If you do have throw rugs, attach them to the floor with carpet tape.  Make sure that you have a light switch at the top of the stairs and the bottom of the stairs. If you do not have them, ask someone to add them for you. What else can I do to help prevent falls?  Wear shoes that:  Do not have high heels.  Have rubber bottoms.  Are comfortable and fit you well.  Are closed at the toe. Do not wear sandals.  If you use a stepladder:  Make sure that it is fully opened. Do not climb a closed stepladder.  Make sure that both sides of the stepladder are locked into place.  Ask someone to hold it for you, if possible.  Clearly mark and make sure that you can see:  Any grab bars or handrails.  First and last steps.  Where the edge of each step is.  Use tools that help you move around (mobility aids) if they are needed. These include:  Canes.  Walkers.  Scooters.  Crutches.  Turn on the lights when you go into a dark area. Replace any light bulbs as soon as they burn out.  Set up your furniture so you have a clear path. Avoid moving your furniture around.  If any of your floors are uneven, fix them.  If there are any pets around you, be aware of where they are.  Review your medicines with your doctor. Some medicines can make you feel dizzy. This can increase your chance of falling. Ask your doctor what other things that you can do to help prevent falls. This information is not intended to replace advice given to you by your health care provider. Make sure you discuss any questions you have with your health care provider. Document Released: 07/24/2009 Document Revised: 03/04/2016 Document Reviewed: 11/01/2014 Elsevier Interactive Patient Education  2017 [employer].

## 2021-02-11 ENCOUNTER — Ambulatory Visit: Payer: 59 | Admitting: Endocrinology

## 2021-02-20 ENCOUNTER — Encounter: Payer: Self-pay | Admitting: Family Medicine

## 2021-02-20 ENCOUNTER — Telehealth (INDEPENDENT_AMBULATORY_CARE_PROVIDER_SITE_OTHER): Payer: 59 | Admitting: Family Medicine

## 2021-02-20 VITALS — BP 145/74 | HR 88 | Temp 97.6°F | Ht 66.0 in | Wt 220.0 lb

## 2021-02-20 DIAGNOSIS — R300 Dysuria: Secondary | ICD-10-CM

## 2021-02-20 LAB — POCT URINALYSIS DIPSTICK
Bilirubin, UA: NEGATIVE
Blood, UA: NEGATIVE
Glucose, UA: NEGATIVE
Ketones, UA: NEGATIVE
Nitrite, UA: NEGATIVE
Protein, UA: POSITIVE — AB
Spec Grav, UA: 1.015 (ref 1.010–1.025)
Urobilinogen, UA: 0.2 E.U./dL
pH, UA: 7 (ref 5.0–8.0)

## 2021-02-20 MED ORDER — NITROFURANTOIN MONOHYD MACRO 100 MG PO CAPS: 100.0000 mg | ORAL_CAPSULE | Freq: Two times a day (BID) | ORAL | 0 refills | Status: DC

## 2021-02-20 NOTE — Patient Instructions (Signed)
It was nice to see you!  You have a urinary tract infection. Please start the antibiotic.  We will check a urine culture to make sure you do not have a resistant bacteria. We will call you if we need to change your medications.   Please make sure you are drinking plenty of fluids over the next few days.  If your symptoms do not improve over the next 5-7 days, or if they worsen, please let us know. Please also let us know if you have worsening back pain, fevers, chills, or body aches.   Take care, Dr Minha Fulco  

## 2021-02-20 NOTE — Progress Notes (Signed)
   [name] is a 60 y.o. female who presents today for an office visit.  Assessment/Plan:  New/Acute Problems: UTI History and UA consistent with UTI.  No red flag signs or symptoms.  No signs of systemic illness.  Will start [place].  Check urine culture.  Encourage good oral hydration.  Discussed reasons to return to care.    Subjective:  HPI:  Symptoms started a couple of days ago. Getting worse. Pain in lower abdomen. Mild dysuria. Some frequency.  Feels similar to previous UTIs.          Objective:  Physical Exam: BP (!) 145/74   Pulse 88   Temp 97.6 F (36.4 C) (Temporal)   Ht 5\' 6"  (1.676 m)   Wt 220 lb (99.8 kg)   SpO2 97%   BMI 35.51 kg/m   Gen: No acute distress, resting comfortably Neuro: Grossly normal, moves all extremities Psych: Normal affect and thought content      Cadence Minton M. Jerline Pain, MD 02/20/2021 11:28 AM

## 2021-02-22 LAB — URINE CULTURE
MICRO NUMBER:: 11888043
SPECIMEN QUALITY:: ADEQUATE

## 2021-02-23 ENCOUNTER — Telehealth: Payer: Self-pay

## 2021-02-23 NOTE — Telephone Encounter (Signed)
Looks like results are back but no comments on them.

## 2021-02-23 NOTE — Telephone Encounter (Signed)
Commented on at 2:11 pm today (after you sent message) looks like patient informed

## 2021-02-23 NOTE — Telephone Encounter (Signed)
Patient would like to know if results of Urine Culture has came back yet and what those results are.  Please advise patient.

## 2021-02-23 NOTE — Progress Notes (Signed)
Please inform patient of the following:  Culture confirms UTI.  Antibiotic she is on should treat this.  Would like for her to finish antibiotics and let us know if not improving.

## 2021-03-05 ENCOUNTER — Other Ambulatory Visit: Payer: Self-pay

## 2021-03-05 ENCOUNTER — Ambulatory Visit: Payer: 59

## 2021-03-05 ENCOUNTER — Encounter: Payer: Self-pay | Admitting: Family Medicine

## 2021-03-05 ENCOUNTER — Ambulatory Visit (INDEPENDENT_AMBULATORY_CARE_PROVIDER_SITE_OTHER): Payer: 59 | Admitting: Family Medicine

## 2021-03-05 VITALS — BP 140/66 | HR 87 | Temp 98.2°F | Ht 66.0 in | Wt 225.6 lb

## 2021-03-05 DIAGNOSIS — R309 Painful micturition, unspecified: Secondary | ICD-10-CM

## 2021-03-05 DIAGNOSIS — M545 Low back pain, unspecified: Secondary | ICD-10-CM

## 2021-03-05 DIAGNOSIS — I152 Hypertension secondary to endocrine disorders: Secondary | ICD-10-CM | POA: Diagnosis not present

## 2021-03-05 DIAGNOSIS — E1159 Type 2 diabetes mellitus with other circulatory complications: Secondary | ICD-10-CM | POA: Diagnosis not present

## 2021-03-05 DIAGNOSIS — E538 Deficiency of other specified B group vitamins: Secondary | ICD-10-CM

## 2021-03-05 DIAGNOSIS — R350 Frequency of micturition: Secondary | ICD-10-CM

## 2021-03-05 LAB — POCT URINALYSIS DIPSTICK
Bilirubin, UA: NEGATIVE
Blood, UA: NEGATIVE
Glucose, UA: NEGATIVE
Ketones, UA: NEGATIVE
Leukocytes, UA: NEGATIVE
Nitrite, UA: NEGATIVE
Protein, UA: NEGATIVE
Spec Grav, UA: 1.02 (ref 1.010–1.025)
Urobilinogen, UA: 0.2 E.U./dL
pH, UA: 6 (ref 5.0–8.0)

## 2021-03-05 MED ORDER — CYANOCOBALAMIN 1000 MCG/ML IJ SOLN: 1000.0000 ug | Freq: Once | INTRAMUSCULAR | Status: DC

## 2021-03-05 MED ORDER — CEPHALEXIN 500 MG PO CAPS: 500.0000 mg | ORAL_CAPSULE | Freq: Three times a day (TID) | ORAL | 0 refills | Status: AC

## 2021-03-05 NOTE — Progress Notes (Signed)
Patient presented today for b12 injection ordered by Dr. Yong Channel administered by Simeon Craft given in [facility] Deltoid  Patient tolerated well

## 2021-03-05 NOTE — Patient Instructions (Addendum)
Health Maintenance Due  Topic Date Due  . OPHTHALMOLOGY EXAM Scheduled 03/19/21 12/25/2020   If UTI symptoms worsen you may start the Keflex or if we reach out to you about your culture results and states there is no infection-initial urine looks pretty good but sometimes infections can hide and only show up in culture  Blood pressure slightly high the last 2 office visits-I would like you to do some home monitoring with goal less than 135/85 at home- let me know early next week how your home numbers are doing  Recommended follow up: Return for as needed for new, worsening, persistent symptoms.

## 2021-03-05 NOTE — Progress Notes (Addendum)
Phone [phone] In person visit   Subjective:   [name] is a 60 y.o. year old very pleasant female patient who presents for/with See problem oriented charting Chief Complaint  Patient presents with  . Possible UTI     Lower back pain, flank pain, some kidney pain too. Started this week, urgency to go but can't go, pain when trying to urinate    This visit occurred during the SARS-CoV-2 public health emergency.  Safety protocols were in place, including screening questions prior to the visit, additional usage of staff PPE, and extensive cleaning of exam room while observing appropriate contact time as indicated for disinfecting solutions.   Past Medical History-  Patient Active Problem List   Diagnosis Date Noted  . Severe persistent asthma 01/04/2019    Priority: High  . Subcortical microvascular ischemic occlusive disease 07/12/2018    Priority: High  . Obstructive chronic bronchitis without exacerbation ([place]) 09/18/2013    Priority: High  . Chest pain 04/11/2013    Priority: High  . Hypertensive heart disease with chronic diastolic congestive heart failure ([place])     Priority: High  . Sarcoidosis of lung ([place]) 04/10/2007    Priority: High  . Type II diabetes mellitus with renal manifestations ([place]) 08/21/2006    Priority: High  . Hypertriglyceridemia 04/02/2020    Priority: Medium  . Aortic atherosclerosis ([place]) 02/14/2020    Priority: Medium  . Constipation 04/21/2018    Priority: Medium  . Epiglottitis     Priority: Medium  . Osteoarthritis of right knee 01/26/2017    Priority: Medium  . CKD (chronic kidney disease) stage 3, GFR 30-59 ml/min ([facility]) 04/27/2015    Priority: Medium  . History of osteomyelitis 03/27/2015    Priority: Medium  . Fatty liver 09/30/2014    Priority: Medium  . Major depression in full remission ([place])     Priority: Medium  . Gout 08/20/2010    Priority: Medium  . Deficiency anemia 09/18/2009    Priority: Medium  . Sleep apnea  04/21/2009    Priority: Medium  . Hyperlipidemia associated with type 2 diabetes mellitus ([place]) 08/21/2006    Priority: Medium  . Hypertension associated with diabetes ([place]) 08/21/2006    Priority: Medium  . Nontraumatic complete tear of left rotator cuff     Priority: Low  . Diabetic neuropathy ([place]) 02/20/2019    Priority: Low  . Rapid palpitations 07/12/2018    Priority: Low  . Impingement of left ankle joint 06/26/2018    Priority: Low  . Total knee replacement status, left 12/07/2017    Priority: Low  . Dysphagia 09/20/2017    Priority: Low  . Plantar fasciitis, right 07/13/2017    Priority: Low  . S/P total knee arthroplasty, right 06/29/2017    Priority: Low  . Osteoarthrosis, localized, primary, knee, right     Priority: Low  . Sprain of calcaneofibular ligament of right ankle 06/06/2017    Priority: Low  . Onychomycosis 10/27/2015    Priority: Low  . MRSA (methicillin resistant staph aureus) culture positive 03/27/2015    Priority: Low  . Hot flashes 07/15/2014    Priority: Low  . Abnormal SPEP 04/17/2014    Priority: Low  . Fracture of left leg 04/17/2014    Priority: Low  . Cushingoid side effect of steroids ([place]) 04/17/2014    Priority: Low  . Internal hemorrhoids     Priority: Low  . Preoperative clearance 03/25/2014    Priority: Low  . Solitary  pulmonary nodule, on CT 02/2013 - stable over 2 years in 2015 02/20/2013    Priority: Low  . Morbid obesity ([place]) 06/04/2009    Priority: Low  . GERD 08/21/2006    Priority: Low  . Class II obesity 02/02/2021  . Iron deficiency anemia 10/01/2020  . Postural dizziness with presyncope 09/30/2020  . Pulmonary sarcoidosis ([facility])     Medications- reviewed and updated Current Outpatient Medications  Medication Sig Dispense Refill  . albuterol (PROVENTIL HFA;VENTOLIN HFA) 108 (90 Base) MCG/ACT inhaler Inhale 1-2 puffs into the lungs every 6 (six) hours as needed for wheezing or shortness of breath. 8 g 5  .  allopurinol (ZYLOPRIM) 100 MG tablet Take 100 mg by mouth every evening.     [name] aspirin EC 81 MG tablet Take 1 tablet (81 mg total) by mouth daily. 30 tablet 11  . atorvastatin (LIPITOR) 40 MG tablet TAKE 1 TABLET BY MOUTH EVERY DAY 90 tablet 3  . BREO ELLIPTA 200-25 MCG/INH AEPB TAKE 1 PUFF BY MOUTH EVERY DAY 60 each 5  . buPROPion (WELLBUTRIN XL) 300 MG 24 hr tablet TAKE 1 TABLET BY MOUTH EVERY DAY 90 tablet 3  . carvedilol (COREG) 12.5 MG tablet Take 12.5 mg by mouth 2 (two) times daily with a meal.    . cephALEXin (KEFLEX) 500 MG capsule Take 1 capsule (500 mg total) by mouth 3 (three) times daily for 7 days. 21 capsule 0  . chlorpheniramine-HYDROcodone (TUSSIONEX PENNKINETIC ER) 10-8 MG/5ML SUER Take 5 mLs by mouth at bedtime as needed for cough. 70 mL 0  . Cholecalciferol 25 MCG (1000 UT) capsule Take 1,000 Units by mouth in the morning and at bedtime.    [name] DEXILANT 60 MG capsule TAKE 1 CAPSULE BY MOUTH EVERY DAY 90 capsule 3  . diclofenac Sodium (VOLTAREN) 1 % GEL Apply 1 application topically as needed (pain.).     [name] diltiazem (CARDIZEM CD) 120 MG 24 hr capsule Take 1 capsule (120 mg total) by mouth daily. 90 capsule 3  . Dulaglutide (TRULICITY) 3 FV/4.9SW SOPN Inject 0.5 mLs (3 mg total) into the skin once a week. 12 pen 3  . famotidine (PEPCID) 20 MG tablet Take 1 tablet (20 mg total) by mouth at bedtime. 30 tablet 3  . fenofibrate 160 MG tablet TAKE 1 TABLET BY MOUTH EVERY DAY 90 tablet 3  . fluticasone (FLONASE) 50 MCG/ACT nasal spray Place 2 sprays into both nostrils daily as needed for allergies. 48 mL 3  . furosemide (LASIX) 40 MG tablet Take 1 tablet (40 mg total) by mouth daily. 180 tablet 1  . gabapentin (NEURONTIN) 100 MG capsule TAKE 1 CAPSULE BY MOUTH THREE TIMES A DAY 270 capsule 3  . glucose blood (CONTOUR NEXT TEST) test strip 1 each by Other route 4 (four) times daily. And lancets 4/day 400 each 3  . HYDROcodone-acetaminophen (NORCO/VICODIN) 5-325 MG tablet Take 1 tablet by  mouth every 6 (six) hours as needed. 15 tablet 0  . insulin glargine (LANTUS SOLOSTAR) 100 UNIT/ML Solostar Pen Inject 25 Units into the skin every morning. 30 mL 2  . insulin lispro (HUMALOG KWIKPEN) 100 UNIT/ML KwikPen Inject 0.15 mLs (15 Units total) into the skin daily with supper. 15 mL 29  . Insulin Pen Needle (PEN NEEDLES) 32G X 4 MM MISC 1 each by Does not apply route 4 (four) times daily. E11.9 400 each 0  . ipratropium (ATROVENT) 0.03 % nasal spray Place 2 sprays into both nostrils every 12 (twelve)  hours. 30 mL 2  . KLOR-CON M20 20 MEQ tablet TAKE 1 & 1/2 TABLETS BY MOUTH TWICE A DAY 270 tablet 2  . lactulose (CHRONULAC) 10 GM/15ML solution TAKE 15 MLS BY MOUTH DAILY AS NEEDED FOR CONSTIPATION. 1419 mL 1  . Lancet Devices (EASY MINI EJECT LANCING DEVICE) MISC Use as directed 4 times daily to test blood sugar 1 each 3  . linaclotide (LINZESS) 290 MCG CAPS capsule Take by mouth as needed.    [name] LORazepam (ATIVAN) 0.5 MG tablet Take 0.5 mg by mouth every 12 (twelve) hours as needed for anxiety.    . meloxicam (MOBIC) 15 MG tablet Take 15 mg by mouth daily.    . metFORMIN (GLUCOPHAGE) 1000 MG tablet Take 1 tablet (1,000 mg total) by mouth 2 (two) times daily with a meal. 180 tablet 1  . Microlet Lancets MISC 1 each by Does not apply route 2 (two) times daily. E11.9 100 each 2  . naproxen (NAPROSYN) 500 MG tablet Take 500 mg by mouth 2 (two) times daily.    . nitrofurantoin, macrocrystal-monohydrate, (MACROBID) 100 MG capsule Take 1 capsule (100 mg total) by mouth 2 (two) times daily. 14 capsule 0  . nitroGLYCERIN (NITROSTAT) 0.4 MG SL tablet Place 1 tablet (0.4 mg total) under the tongue every 5 (five) minutes as needed for chest pain. Reported on 01/05/2016 25 tablet 6  . nystatin (MYCOSTATIN) 100000 UNIT/ML suspension nystatin 100,000 unit/mL oral suspension  SWISH, GARGLE, AND SPIT 20 ML 4 TIMES A DAY UNTIL RESOLVED    . ondansetron (ZOFRAN-ODT) 4 MG disintegrating tablet Take 1 tablet (4  mg total) by mouth every 8 (eight) hours as needed for nausea or vomiting. 50 tablet 2  . telmisartan (MICARDIS) 20 MG tablet TAKE 1 TABLET BY MOUTH EVERY DAY 90 tablet 1  . tiZANidine (ZANAFLEX) 4 MG tablet Take 0.5-1 tablets (2-4 mg total) by mouth every 8 (eight) hours as needed for muscle spasms. 30 tablet 1  . venlafaxine XR (EFFEXOR-XR) 75 MG 24 hr capsule TAKE 1 CAPSULE BY MOUTH TWICE A DAY 180 capsule 3   Current Facility-Administered Medications  Medication Dose Route Frequency Provider Last Rate Last Admin  . cyanocobalamin ((VITAMIN B-12)) injection 1,000 mcg  1,000 mcg Intramuscular Once [name], MD         Objective:  BP 140/66   Pulse 87   Temp 98.2 F (36.8 C) (Temporal)   Ht 5\' 6"  (1.676 m)   Wt 225 lb 9.6 oz (102.3 kg)   SpO2 96%   BMI 36.41 kg/m  Gen: NAD, resting comfortably, well appearing CV: RRR no murmurs rubs or gallops No suprapubic pain reported Ext: no edema Skin: warm, dry Neuro: grossly normal, moves all extremities Back: no CVA tenderness. Some mild upper back and lower back pain with palpation.     Assessment and Plan  # Social updates: going to visit brother in [place] over the weekend  #Concern for UTI S: complains of pain with urination, right low back pain, tenderness in lower quadrant Patients symptoms started a few days ago.  Slightly worsening ROS- no fever, chills, nausea, vomiting, flank pain. No blood in urine.  A/P: UA without clear UTI.. Will take a urine culture. Empiric treatment with: keflex 500 mg 3 times per day-she has allergy to cephalosporins listed but has done okay with ceftriaxone and Keflex in the past Patient to follow up if new or worsening symptoms or failure to improve.   #hypertension S: medication: compliant  with Lasix 40 mg once a day, diltiazem extended release 120 mg, carvedilol 12.5 mg BID, telmisartan 20mg  daily, imdur 30mg  Home readings #s: none taken BP Readings from Last 3 Encounters:  03/05/21  140/66  02/20/21 (!) 145/74  02/09/21 118/68  A/P: Mild poor control.  Instead of increasing medication patient prefers to-monitor at home and call back if blood pressure is elevated greater than 135/85 at home  Recommended follow up: Return for as needed for new, worsening, persistent symptoms. Future Appointments  Date Time Provider [place]  04/09/2021  9:30 AM [facility] NURSE [facility]  05/04/2021 10:15 AM [facility] LAB [facility] None  05/04/2021 10:40 AM Heath Lark, MD [facility] None  05/04/2021 11:30 AM [facility] INFUSION [facility] None  07/10/2021  2:40 PM [name], MD [facility]  02/15/2022  9:30 AM [facility] HEALTH COACH [facility]    Lab/Order associations:   ICD-10-CM   1. Frequent urination  R35.0 POCT Urinalysis Dipstick    Urine Culture  2. Low back pain, unspecified back pain laterality, unspecified chronicity, unspecified whether sciatica present  M54.50 POCT Urinalysis Dipstick  3. Pain with urination  R30.9 POCT Urinalysis Dipstick    Urine Culture  4. Hypertension associated with diabetes ([place])  E11.59    I15.2     Meds ordered this encounter  Medications  . cephALEXin (KEFLEX) 500 MG capsule    Sig: Take 1 capsule (500 mg total) by mouth 3 (three) times daily for 7 days.    Dispense:  21 capsule    Refill:  0   I,Harris Phan,acting as a scribe for Garret Reddish, MD.,have documented all relevant documentation on the behalf of Garret Reddish, MD,as directed by  Garret Reddish, MD while in the presence of Garret Reddish, MD.   I, Garret Reddish, MD, have reviewed all documentation for this visit. The documentation on 03/05/21 for the exam, diagnosis, procedures, and orders are all accurate and complete.   Return precautions advised.  Garret Reddish, MD

## 2021-03-06 LAB — URINE CULTURE
MICRO NUMBER:: 11938465
SPECIMEN QUALITY:: ADEQUATE

## 2021-03-11 ENCOUNTER — Other Ambulatory Visit: Payer: Self-pay | Admitting: Physician Assistant

## 2021-03-12 DIAGNOSIS — M9712XD Periprosthetic fracture around internal prosthetic left knee joint, subsequent encounter: Secondary | ICD-10-CM | POA: Diagnosis not present

## 2021-03-12 DIAGNOSIS — S72492D Other fracture of lower end of left femur, subsequent encounter for closed fracture with routine healing: Secondary | ICD-10-CM | POA: Diagnosis not present

## 2021-03-12 DIAGNOSIS — T8454XD Infection and inflammatory reaction due to internal left knee prosthesis, subsequent encounter: Secondary | ICD-10-CM | POA: Diagnosis not present

## 2021-03-17 ENCOUNTER — Telehealth: Payer: Self-pay

## 2021-03-17 DIAGNOSIS — N390 Urinary tract infection, site not specified: Secondary | ICD-10-CM

## 2021-03-17 NOTE — Telephone Encounter (Signed)
You may refer to urology under frequent UTIs.  Please include copies of all urine cultures from this year  If she has current symptoms she will need a visit for that

## 2021-03-17 NOTE — Telephone Encounter (Signed)
From lab culture on 05/28 "You do have a UTI but the antibiotic should appropriately treat this. Please complete the course. If yo Simonne Come new or worsening or persistent symptoms please let us know. "   Before I call pt would next steps be urology since hey are becoming more frequent?

## 2021-03-17 NOTE — Telephone Encounter (Signed)
Patient is calling in wanting to know if someone is able to give her a call back in regards to her urine culture. [name] states her UTI's are becoming to frequent and wanting to know what next steps are.

## 2021-03-18 NOTE — Addendum Note (Signed)
Addended by: Clyde Lundborg A on: 03/18/2021 08:21 AM   Modules accepted: Orders

## 2021-03-18 NOTE — Telephone Encounter (Signed)
Called and lm on pt vm tcb, referral has been placed. When pt calls back if she is having symptoms please schedule an appointment for her.

## 2021-03-20 DIAGNOSIS — H5203 Hypermetropia, bilateral: Secondary | ICD-10-CM | POA: Diagnosis not present

## 2021-03-20 DIAGNOSIS — Z961 Presence of intraocular lens: Secondary | ICD-10-CM | POA: Diagnosis not present

## 2021-03-20 DIAGNOSIS — D869 Sarcoidosis, unspecified: Secondary | ICD-10-CM | POA: Diagnosis not present

## 2021-03-20 DIAGNOSIS — E119 Type 2 diabetes mellitus without complications: Secondary | ICD-10-CM | POA: Diagnosis not present

## 2021-03-20 LAB — HM DIABETES EYE EXAM

## 2021-03-25 ENCOUNTER — Encounter: Payer: Self-pay | Admitting: Family Medicine

## 2021-04-08 ENCOUNTER — Ambulatory Visit (INDEPENDENT_AMBULATORY_CARE_PROVIDER_SITE_OTHER): Payer: 59 | Admitting: *Deleted

## 2021-04-08 DIAGNOSIS — E538 Deficiency of other specified B group vitamins: Secondary | ICD-10-CM

## 2021-04-08 MED ORDER — CYANOCOBALAMIN 1000 MCG/ML IJ SOLN
1000.0000 ug | Freq: Once | INTRAMUSCULAR | Status: AC
Start: 2021-04-08 — End: 2021-04-08
  Administered 2021-04-08: 1000 ug via INTRAMUSCULAR

## 2021-04-08 NOTE — Progress Notes (Signed)
Patient presents for B12 injection today. Patient received her B12 injection in Left Deltoid. Patient tolerated injection well.  Documentation entered in [facility] in [place]. Patient will return in 2 week to recheck B12 levels

## 2021-04-09 ENCOUNTER — Ambulatory Visit: Payer: 59

## 2021-04-09 ENCOUNTER — Other Ambulatory Visit: Payer: Self-pay | Admitting: Endocrinology

## 2021-04-09 DIAGNOSIS — E1129 Type 2 diabetes mellitus with other diabetic kidney complication: Secondary | ICD-10-CM

## 2021-04-17 ENCOUNTER — Other Ambulatory Visit: Payer: Self-pay | Admitting: Family Medicine

## 2021-04-17 NOTE — Telephone Encounter (Signed)
You may send in Aciphex/rabeprazole 20 mg daily #90 with 3 refills and inform patient

## 2021-04-17 NOTE — Telephone Encounter (Signed)
See alternates below.

## 2021-04-20 ENCOUNTER — Other Ambulatory Visit: Payer: Self-pay

## 2021-04-20 MED ORDER — RABEPRAZOLE SODIUM 20 MG PO TBEC: 20.0000 mg | DELAYED_RELEASE_TABLET | Freq: Every day | ORAL | 3 refills | Status: DC

## 2021-04-22 ENCOUNTER — Other Ambulatory Visit: Payer: Self-pay

## 2021-04-22 ENCOUNTER — Encounter: Payer: Self-pay | Admitting: Family Medicine

## 2021-04-22 ENCOUNTER — Other Ambulatory Visit (INDEPENDENT_AMBULATORY_CARE_PROVIDER_SITE_OTHER): Payer: 59

## 2021-04-22 DIAGNOSIS — E538 Deficiency of other specified B group vitamins: Secondary | ICD-10-CM | POA: Diagnosis not present

## 2021-04-22 DIAGNOSIS — D649 Anemia, unspecified: Secondary | ICD-10-CM | POA: Diagnosis not present

## 2021-04-22 LAB — CBC WITH DIFFERENTIAL/PLATELET
Basophils Absolute: 0.1 10*3/uL (ref 0.0–0.1)
Basophils Relative: 0.7 % (ref 0.0–3.0)
Eosinophils Absolute: 0.5 10*3/uL (ref 0.0–0.7)
Eosinophils Relative: 5.6 % — ABNORMAL HIGH (ref 0.0–5.0)
HCT: 31.2 % — ABNORMAL LOW (ref 36.0–46.0)
Hemoglobin: 10.7 g/dL — ABNORMAL LOW (ref 12.0–15.0)
Lymphocytes Relative: 27 % (ref 12.0–46.0)
Lymphs Abs: 2.5 10*3/uL (ref 0.7–4.0)
MCHC: 34.4 g/dL (ref 30.0–36.0)
MCV: 88.4 fl (ref 78.0–100.0)
Monocytes Absolute: 0.5 10*3/uL (ref 0.1–1.0)
Monocytes Relative: 5.6 % (ref 3.0–12.0)
Neutro Abs: 5.8 10*3/uL (ref 1.4–7.7)
Neutrophils Relative %: 61.1 % (ref 43.0–77.0)
Platelets: 296 10*3/uL (ref 150.0–400.0)
RBC: 3.53 Mil/uL — ABNORMAL LOW (ref 3.87–5.11)
RDW: 13.2 % (ref 11.5–15.5)
WBC: 9.4 10*3/uL (ref 4.0–10.5)

## 2021-04-22 LAB — VITAMIN B12: Vitamin B-12: 445 pg/mL (ref 211–911)

## 2021-04-23 ENCOUNTER — Telehealth: Payer: Self-pay

## 2021-04-23 ENCOUNTER — Encounter: Payer: Self-pay | Admitting: Family Medicine

## 2021-04-23 NOTE — Telephone Encounter (Signed)
Mychart sent.

## 2021-04-23 NOTE — Telephone Encounter (Signed)
Pt states she wants to know if you can tell her why her RBC's are low, and what to do about her hgb. She would also like to know what the Eosinophils are being checked for. She would like the response to be via mychart so she can read and go back and reference for herself if she forgets.

## 2021-04-23 NOTE — Telephone Encounter (Signed)
Patient returned call regarding lab work please return call

## 2021-04-29 ENCOUNTER — Ambulatory Visit (INDEPENDENT_AMBULATORY_CARE_PROVIDER_SITE_OTHER): Payer: 59

## 2021-04-29 ENCOUNTER — Ambulatory Visit (INDEPENDENT_AMBULATORY_CARE_PROVIDER_SITE_OTHER): Payer: 59 | Admitting: Endocrinology

## 2021-04-29 ENCOUNTER — Other Ambulatory Visit: Payer: Self-pay

## 2021-04-29 VITALS — BP 138/70 | HR 86 | Ht 66.0 in | Wt 227.8 lb

## 2021-04-29 DIAGNOSIS — E538 Deficiency of other specified B group vitamins: Secondary | ICD-10-CM | POA: Diagnosis not present

## 2021-04-29 DIAGNOSIS — E1129 Type 2 diabetes mellitus with other diabetic kidney complication: Secondary | ICD-10-CM

## 2021-04-29 DIAGNOSIS — Z794 Long term (current) use of insulin: Secondary | ICD-10-CM

## 2021-04-29 LAB — POCT GLYCOSYLATED HEMOGLOBIN (HGB A1C): Hemoglobin A1C: 6.7 % — AB (ref 4.0–5.6)

## 2021-04-29 MED ORDER — LANTUS SOLOSTAR 100 UNIT/ML ~~LOC~~ SOPN: 22.0000 [IU] | PEN_INJECTOR | SUBCUTANEOUS | 2 refills | Status: DC

## 2021-04-29 MED ORDER — CYANOCOBALAMIN 1000 MCG/ML IJ SOLN
1000.0000 ug | Freq: Once | INTRAMUSCULAR | Status: AC
  Administered 2021-04-29: 1000 ug via INTRAMUSCULAR

## 2021-04-29 MED ORDER — METFORMIN HCL ER 500 MG PO TB24: 2000.0000 mg | ORAL_TABLET | Freq: Every day | ORAL | 3 refills | Status: DC

## 2021-04-29 NOTE — Patient Instructions (Addendum)
I have sent a prescription to your pharmacy, to change the metformin to extended-release, and: Please reduce the Lantus to 22 units each morning. Please continue the same other medications.  check your blood sugar 4 times a day: before the 3 meals, and at bedtime.  also check if you have symptoms of your blood sugar being too high or too low.  please keep a record of the readings and bring it to your next appointment here (or you can bring the meter itself).  You can write it on any piece of paper.  please call us sooner if your blood sugar goes below 70, or if you have a lot of readings over 200.   Please come back for a follow-up appointment in 3-4 months.

## 2021-04-29 NOTE — Progress Notes (Signed)
Subjective:    Patient ID: [name], female    DOB: 1960/12/02, 60 y.o.   MRN: [id number]  HPI Pt returns for f/u of diabetes mellitus:  DM type: Insulin-requiring type 2 Dx'ed: 4742 Complications: PN and mild CAD.  Therapy: insulin since 5956, Trulicity, and metformin.  GDM: never DKA: never Severe hypoglycemia: never Pancreatitis: never Pancreatic imaging: CT (2015) showed fatty atrophy.  Other: she takes 2 QD insulins, after poor results with multiple daily injections; she intermittently takes prednisone for sarcoidosis or AB.  Interval history: no recent steroids. She takes meds as rx'ed.  cbg varies from 67-166.  She has hypoglycemia approx once per month.  This happens in the middle of the night.  She also has intermitt nausea. Past Medical History:  Diagnosis Date   Abnormal SPEP 04/17/2014   Acute left ankle pain 01/26/2017   ANEMIA-UNSPECIFIED 09/18/2009   Anxiety    Arthritis    Blood transfusion without reported diagnosis    Cataract    CHF (congestive heart failure) (HCC)    Chronic diastolic heart failure, NYHA class 2 (HCC)    Normal LVEDP by May 2018   COPD (chronic obstructive pulmonary disease) ([place])    Depression    DIABETES MELLITUS, TYPE II 08/21/2006   Diabetic osteomyelitis ([place]) 05/29/2015   Fatty liver    Fracture of 5th metatarsal    non union   GERD 38/75/6433   Had fundoplication   GOUT 29/51/8841   Hammer toe of right foot    2-5th toes   Hx of umbilical hernia repair    HYPERLIPIDEMIA 08/21/2006   HYPERTENSION 08/21/2006   Infection of wound due to methicillin resistant Staphylococcus aureus (MRSA)    Internal hemorrhoids    Kidney problem    Multiple allergies 10/14/2016   OBESITY 06/04/2009   Onychomycosis 10/27/2015   Osteomyelitis of left foot ([place]) 05/29/2015   Osteoporosis    Pulmonary sarcoidosis ([place])    Followed locally by pulmonology, but also by Dr. Casper Harrison at [facility]   Right knee pain 01/26/2017   Vocal  cord dysfunction    Wears partial dentures     Past Surgical History:  Procedure Laterality Date   ABDOMINAL HYSTERECTOMY     APPENDECTOMY     BLADDER SUSPENSION  11/11/2011   Procedure: TRANSVAGINAL TAPE (TVT) PROCEDURE;  Surgeon: Olga Millers, MD;  Location: [place] [facility];  Service: Gynecology;  Laterality: N/A;   CAROTIDS  02/18/11   CAROTID DUPLEX; VERTEBRALS ARE PATENT WITH ANTEGRADE FLOW. ICA/CCA RATIO 1.61 ON RIGHT AND 0.75 ON LEFT   CATARACT EXTRACTION, BILATERAL     CHOLECYSTECTOMY  1984   COLONOSCOPY  04/29/2010   [name]   CYSTOSCOPY  11/11/2011   Procedure: CYSTOSCOPY;  Surgeon: Olga Millers, MD;  Location: [place] [facility];  Service: Gynecology;  Laterality: N/A;   EXTUBATION (ENDOTRACHEAL) IN OR N/A 09/23/2017   Procedure: EXTUBATION (ENDOTRACHEAL) IN OR;  Surgeon: Helayne Seminole, MD;  Location: [place];  Service: ENT;  Laterality: N/A;   FIBEROPTIC LARYNGOSCOPY AND TRACHEOSCOPY N/A 09/23/2017   Procedure: FLEXIBLE FIBEROPTIC LARYNGOSCOPY;  Surgeon: Helayne Seminole, MD;  Location: [place];  Service: ENT;  Laterality: N/A;   FRACTURE SURGERY     foot   [place] Left 10/02/2018   Procedure: HALLUX ARTHRODESIS;  Surgeon: Edrick Kins, DPM;  Location: [facility];  Service: Podiatry;  Laterality: Left;   HAMMER TOE SURGERY Left 10/02/2018   Procedure: HAMMER TOE CORRECTION 2ND 3RD 4RD  FIFTH TOE;  Surgeon: Edrick Kins, DPM;  Location: [facility];  Service: Podiatry;  Laterality: Left;   HAMMER TOE SURGERY Right 04/12/2019   Procedure: HAMMER TOE CORRECTION, 2ND, 3RD, 4TH AND 5TH TOES OF RIGHT FOOT;  Surgeon: Edrick Kins, DPM;  Location: [place];  Service: Podiatry;  Laterality: Right;   HAMMER TOE SURGERY Left 10/25/2019   Procedure: HAMMER TOE REPAIR SECOND, THIRD, FOUTH;  Surgeon: Edrick Kins, DPM;  Location: [place] [facility];  Service: Podiatry;  Laterality: Left;   HERNIA REPAIR     I & D EXTREMITY Left 06/27/2015   Procedure: Partial Excision Left Calcaneus, Place Antibiotic Beads, and  Wound VAC;  Surgeon: Newt Minion, MD;  Location: [place];  Service: Orthopedics;  Laterality: Left;   JOINT REPLACEMENT     KNEE ARTHROSCOPY     right   LEFT AND RIGHT HEART CATHETERIZATION WITH CORONARY ANGIOGRAM N/A 04/23/2013   Procedure: LEFT AND RIGHT HEART CATHETERIZATION WITH CORONARY ANGIOGRAM;  Surgeon: Leonie Man, MD;  Location: [facility] C[facility];  Service: Cardiovascular;  Laterality: N/A;   LEFT HEART CATH AND CORONARY ANGIOGRAPHY N/A 03/11/2017   Procedure: Left Heart Cath and Coronary Angiography;  Surgeon: Sherren Mocha, MD;  Location: [place] CV LAB; angiographically minimal CAD in the LAD otherwise normal.;  Normal LVEDP.  FALSE POSITIVE MYOVIEW   LEFT HEART CATH AND CORONARY ANGIOGRAPHY  07/20/2010   LVEF 50-55% WITH VERY MILD GLOBAL HYPOKINESIA; ESSENTIALLY NORMAL CORONARY ARTERIES; NORMAL LV FUNCTION   METATARSAL OSTEOTOMY WITH OPEN REDUCTION INTERNAL FIXATION (ORIF) METATARSAL WITH FUSION Left 04/09/2014   Procedure: LEFT FOOT FRACTURE OPEN TREATMENT METATARSAL INCLUDES INTERNAL FIXATION EACH;  Surgeon: Lorn Junes, MD;  Location: [place];  Service: Orthopedics;  Laterality: Left;   NISSEN FUNDOPLICATION  1610   NM MYOVIEW LTD --> FALSE POSITIVE  03/10/2017   : Moderate size "stress-induced "perfusion defect at the apex as well as "ill-defined stress-induced perfusion defect in the lateral wall.  EF 55%.  INTERMEDIATE risk. -->  FALSE POSITIVE   ORIF DISTAL FEMUR FRACTURE  08/08/2020   [facility]: ORIF of left extra-articular periprosthetic distal femur fracture with synthesis of the femur plate with cortical nonlocking screws.  (Thought to be pathologic fracture due to osteoporosis) -> after a fall   ORIF FEMUR W/ PERI-IMPLANT  08/29/2020   [facility]) revision of left femur fracture ORIF plate fixation screws; culture positive for MSSA   Right and left CARDIAC CATHETERIZATION  04/23/2013   Angiographic normal coronaries; LVEDP  20 mmHg, PCWP 12-14 mmHg, RAP 12 mmHg.; Fick CO/CI 4.9/2.2   SHOULDER ARTHROSCOPY Left 03/14/2019   Procedure: LEFT SHOULDER ARTHROSCOPY, DEBRIDEMENT, AND DECOMPRESSION;  Surgeon: Newt Minion, MD;  Location: [place];  Service: Orthopedics;  Laterality: Left;   TOTAL KNEE ARTHROPLASTY Right 06/29/2017   Procedure: RIGHT TOTAL KNEE ARTHROPLASTY;  Surgeon: Newt Minion, MD;  Location: [place];  Service: Orthopedics;  Laterality: Right;   TOTAL KNEE ARTHROPLASTY Left 12/07/2017   TOTAL KNEE ARTHROPLASTY Left 12/07/2017   Procedure: LEFT TOTAL KNEE ARTHROPLASTY;  Surgeon: Newt Minion, MD;  Location: [place];  Service: Orthopedics;  Laterality: Left;   TRACHEOSTOMY TUBE PLACEMENT N/A 09/20/2017   Procedure: AWAKE INTUBATION WITH ANESTHESIA WITH VIDEO ASSISTANCE;  Surgeon: Helayne Seminole, MD;  Location: [place] [facility];  Service: ENT;  Laterality: N/A;   TRANSTHORACIC ECHOCARDIOGRAM  08/2014   Normal LV size and function.  Mild LVH.  EF  55-60%.  Normal regional wall motion.  GR 1 DD.  Normal RV size and function .   TRANSTHORACIC ECHOCARDIOGRAM  08/12/2020    [facility]): Normal LV size and function.  EF 55 to 60%.  No RWM A.  Normal filling pattern.  Normal RV.  No significant valvular disease-stenosis or regurgitation.  No vegetation.   TUBAL LIGATION     with reversal in 1994   UPPER GASTROINTESTINAL ENDOSCOPY     VENTRAL HERNIA REPAIR      Social History   Socioeconomic History   Marital status: Married    Spouse name: ARMELLA STOGNER   Number of children: 2   Years of education: 12   Highest education level: Not on file  Occupational History   Occupation: DISABLED  Tobacco Use   Smoking status: Never   Smokeless tobacco: Never  Vaping Use   Vaping Use: Never used  Substance and Sexual Activity   Alcohol use: No    Alcohol/week: 0.0 standard drinks   Drug use: No   Sexual activity: Yes    Birth control/protection: Surgical  Other Topics Concern   Not on file  Social  History Narrative   Married 1994. 2 sons who both live close and 1 grandson.       Disability due to sarcoidosis. 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 [employer]   Social Determinants of Health   Financial Resource Strain: Low Risk    Difficulty of Paying Living Expenses: Not hard at all  Food Insecurity: No Food Insecurity   Worried About Charity fundraiser in the Last Year: Never true   Arboriculturist in the Last Year: Never true  Transportation Needs: No Transportation Needs   Lack of Transportation (Medical): No   Lack of Transportation (Non-Medical): No  Physical Activity: Insufficiently Active   Days of Exercise per Week: 3 days   Minutes of Exercise per Session: 30 min  Stress: No Stress Concern Present   Feeling of Stress : Not at all  Social Connections: Moderately Integrated   Frequency of Communication with Friends and Family: More than three times a week   Frequency of Social Gatherings with Friends and Family: More than three times a week   Attends Religious Services: More than 4 times per year   Active Member of [employer] or Organizations: No   Attends Archivist Meetings: Never   Marital Status: Married  Human resources officer Violence: Not At Risk   Fear of Current or Ex-Partner: No   Emotionally Abused: No   Physically Abused: No   Sexually Abused: No    Current Outpatient Medications on File Prior to Visit  Medication Sig Dispense Refill   albuterol (PROVENTIL HFA;VENTOLIN HFA) 108 (90 Base) MCG/ACT inhaler Inhale 1-2 puffs into the lungs every 6 (six) hours as needed for wheezing or shortness of breath. 8 g 5   allopurinol (ZYLOPRIM) 100 MG tablet Take 100 mg by mouth every evening.      aspirin EC 81 MG tablet Take 1 tablet (81 mg total) by mouth daily. 30 tablet 11   atorvastatin (LIPITOR) 40 MG tablet TAKE 1 TABLET BY  MOUTH EVERY DAY 90 tablet 3   BREO ELLIPTA 200-25 MCG/INH AEPB TAKE 1 PUFF BY MOUTH EVERY DAY 60 each 5   buPROPion (WELLBUTRIN XL) 300 MG  24 hr tablet TAKE 1 TABLET BY MOUTH EVERY DAY 90 tablet 3   carvedilol (COREG) 12.5 MG tablet Take 12.5 mg by mouth 2 (two) times daily with a meal.     Cholecalciferol 25 MCG (1000 UT) capsule Take 1,000 Units by mouth in the morning and at bedtime.     DEXILANT 60 MG capsule TAKE 1 CAPSULE BY MOUTH EVERY DAY 90 capsule 3   diclofenac Sodium (VOLTAREN) 1 % GEL Apply 1 application topically as needed (pain.).      diltiazem (CARDIZEM CD) 120 MG 24 hr capsule Take 1 capsule (120 mg total) by mouth daily. 90 capsule 3   Dulaglutide (TRULICITY) 3 VC/9.4WH SOPN Inject 0.5 mLs (3 mg total) into the skin once a week. 12 pen 3   famotidine (PEPCID) 20 MG tablet Take 1 tablet (20 mg total) by mouth at bedtime. 30 tablet 3   fenofibrate 160 MG tablet TAKE 1 TABLET BY MOUTH EVERY DAY 90 tablet 3   fluticasone (FLONASE) 50 MCG/ACT nasal spray Place 2 sprays into both nostrils daily as needed for allergies. 48 mL 3   furosemide (LASIX) 40 MG tablet Take 1 tablet (40 mg total) by mouth daily. 180 tablet 1   gabapentin (NEURONTIN) 100 MG capsule TAKE 1 CAPSULE BY MOUTH THREE TIMES A DAY 270 capsule 3   glucose blood (CONTOUR NEXT TEST) test strip 1 each by Other route 4 (four) times daily. And lancets 4/day 400 each 3   insulin lispro (HUMALOG KWIKPEN) 100 UNIT/ML KwikPen Inject 0.15 mLs (15 Units total) into the skin daily with supper. 15 mL 29   Insulin Pen Needle (PEN NEEDLES) 32G X 4 MM MISC 1 each by Does not apply route 4 (four) times daily. E11.9 400 each 0   ipratropium (ATROVENT) 0.03 % nasal spray PLACE 2 SPRAYS INTO BOTH NOSTRILS EVERY 12 (TWELVE) HOURS. 30 mL 2   KLOR-CON M20 20 MEQ tablet TAKE 1 & 1/2 TABLETS BY MOUTH TWICE A DAY 270 tablet 2   lactulose (CHRONULAC) 10 GM/15ML solution TAKE 15 MLS BY MOUTH DAILY AS NEEDED FOR CONSTIPATION. 1419 mL 1   Lancet  Devices (EASY MINI EJECT LANCING DEVICE) MISC Use as directed 4 times daily to test blood sugar 1 each 3   linaclotide (LINZESS) 290 MCG CAPS capsule Take by mouth as needed.     LORazepam (ATIVAN) 0.5 MG tablet Take 0.5 mg by mouth every 12 (twelve) hours as needed for anxiety.     meloxicam (MOBIC) 15 MG tablet Take 15 mg by mouth daily.     Microlet Lancets MISC 1 each by Does not apply route 2 (two) times daily. E11.9 100 each 2   nitroGLYCERIN (NITROSTAT) 0.4 MG SL tablet Place 1 tablet (0.4 mg total) under the tongue every 5 (five) minutes as needed for chest pain. Reported on 01/05/2016 25 tablet 6   nystatin (MYCOSTATIN) 100000 UNIT/ML suspension nystatin 100,000 unit/mL oral suspension  SWISH, GARGLE, AND SPIT 20 ML 4 TIMES A DAY UNTIL RESOLVED     ondansetron (ZOFRAN-ODT) 4 MG disintegrating tablet Take 1 tablet (4 mg total) by mouth every 8 (eight) hours as needed for nausea or vomiting. 50 tablet 2   RABEprazole (ACIPHEX) 20 MG tablet Take 1 tablet (20 mg total) by mouth daily. 90 tablet 3   telmisartan (MICARDIS) 20 MG tablet TAKE 1 TABLET BY MOUTH EVERY DAY 90 tablet 1   tiZANidine (ZANAFLEX) 4 MG tablet Take 0.5-1 tablets (2-4 mg total) by mouth  every 8 (eight) hours as needed for muscle spasms. 30 tablet 1   venlafaxine XR (EFFEXOR-XR) 75 MG 24 hr capsule TAKE 1 CAPSULE BY MOUTH TWICE A DAY 180 capsule 3   [DISCONTINUED] mupirocin nasal ointment (BACTROBAN) 2 % Place 1 application into the nose 2 (two) times daily. Use one-half of tube in each nostril twice daily for five (5) days. After application, press sides of nose together and gently massage. 10 g 0   Current Facility-Administered Medications on File Prior to Visit  Medication Dose Route Frequency Provider Last Rate Last Admin   cyanocobalamin ((VITAMIN B-12)) injection 1,000 mcg  1,000 mcg Intramuscular Once [name], MD        Allergies  Allergen Reactions   Methotrexate Other (See Comments)    Peri-oral and  buccal lesions   Vancomycin Other (See Comments)    DOSE RELATED NEPHROTOXICITY   Lisinopril Cough   Chlorhexidine Itching   Clindamycin/Lincomycin Nausea And Vomiting and Rash   Doxycycline Rash   Teflaro [Ceftaroline] Rash and Other (See Comments)    Tolerates ceftriaxone     Family History  Problem Relation Age of Onset   Diabetes Father    Heart attack Father 62   Coronary artery disease Father    Heart failure Father    Throat cancer Father    Hypertension Father    Heart disease Father    Sleep apnea Father    Obesity Father    COPD Mother    Emphysema Mother    Asthma Mother    Heart failure Mother    Breast cancer Mother    Diabetes Mother    Kidney disease Mother    Thyroid disease Mother    Heart attack Maternal Grandfather    Sarcoidosis Maternal Uncle    Lung cancer Brother    Diabetes Brother    Colon cancer Neg Hx    Colon polyps Neg Hx    Esophageal cancer Neg Hx    Rectal cancer Neg Hx    Stomach cancer Neg Hx     BP 138/70 (BP Location: Right Arm, Patient Position: Sitting, Cuff Size: Large)   Pulse 86   Ht 5\' 6"  (1.676 m)   Wt 227 lb 12.8 oz (103.3 kg)   SpO2 95%   BMI 36.77 kg/m    Review of Systems Denies LOC    Objective:   Physical Exam Pulses: dorsalis pedis intact bilat.   MSK: no deformity of the feet CV: trace bilat leg edema Skin:  no ulcer on the feet.  normal color and temp on the feet. Neuro: sensation is intact to touch on the feet, but decreased from normal.    Lab Results  Component Value Date   HGBA1C 6.7 (A) 04/29/2021       Assessment & Plan:  Insulin-requiring type 2 DM. Hypoglycemia, due to insulin: this limits aggressiveness of glycemic control.   Nausea: due to metformin  Patient Instructions  I have sent a prescription to your pharmacy, to change the metformin to extended-release, and: Please reduce the Lantus to 22 units each morning. Please continue the same other medications.  check your blood  sugar 4 times a day: before the 3 meals, and at bedtime.  also check if you have symptoms of your blood sugar being too high or too low.  please keep a record of the readings and bring it to your next appointment here (or you can bring the meter itself).  You can write it  on any piece of paper.  please call us sooner if your blood sugar goes below 70, or if you have a lot of readings over 200.   Please come back for a follow-up appointment in 3-4 months.

## 2021-04-29 NOTE — Progress Notes (Signed)
Per orders of Dr. Yong Channel, injection of B-12 given by Tobe Sos in right deltoid. Patient tolerated injection well. Patient will make appointment for 1 month.

## 2021-04-30 IMAGING — MG DIGITAL SCREENING BILAT W/ TOMO W/ CAD
8 series · 8 of 24 positions shown · non-contrast
Comparison: Previous exam(s).

CLINICAL DATA: Screening.

EXAM:
DIGITAL SCREENING BILATERAL MAMMOGRAM WITH TOMO AND CAD

[R CC synth-2D]
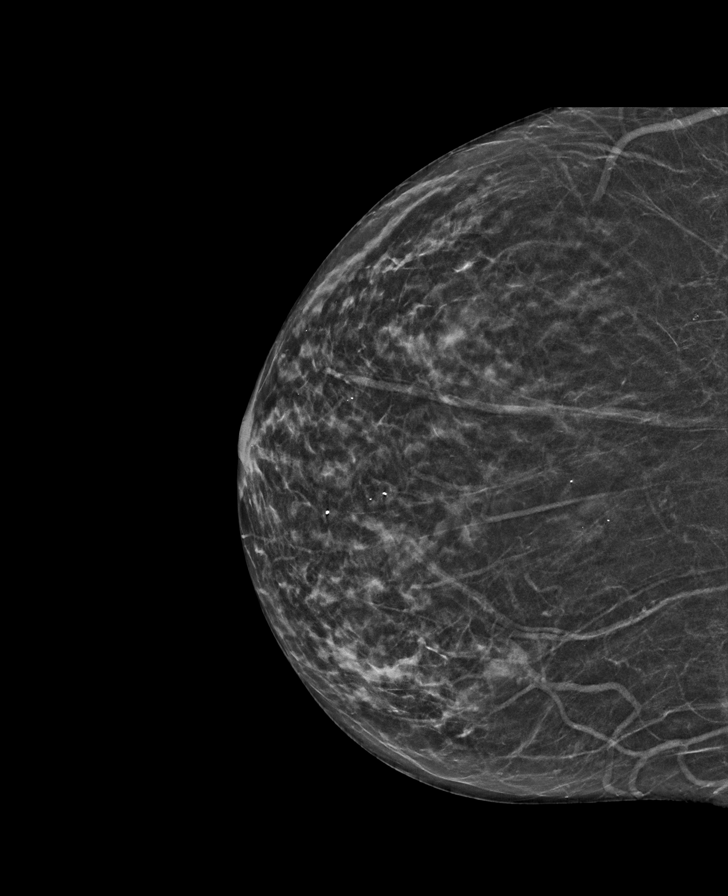

[L MLO synth-2D]
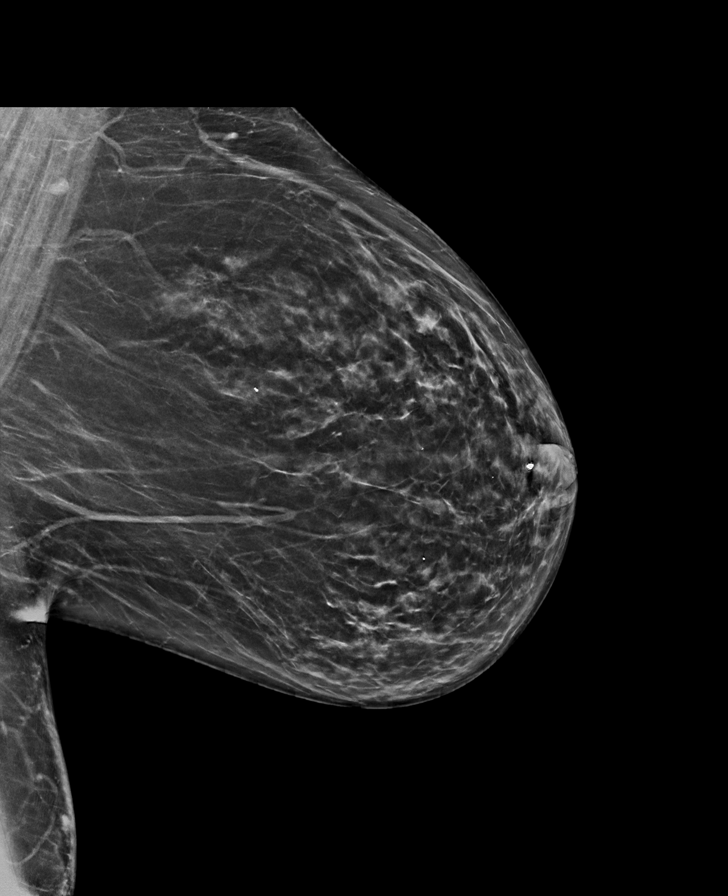

[R MLO synth-2D]
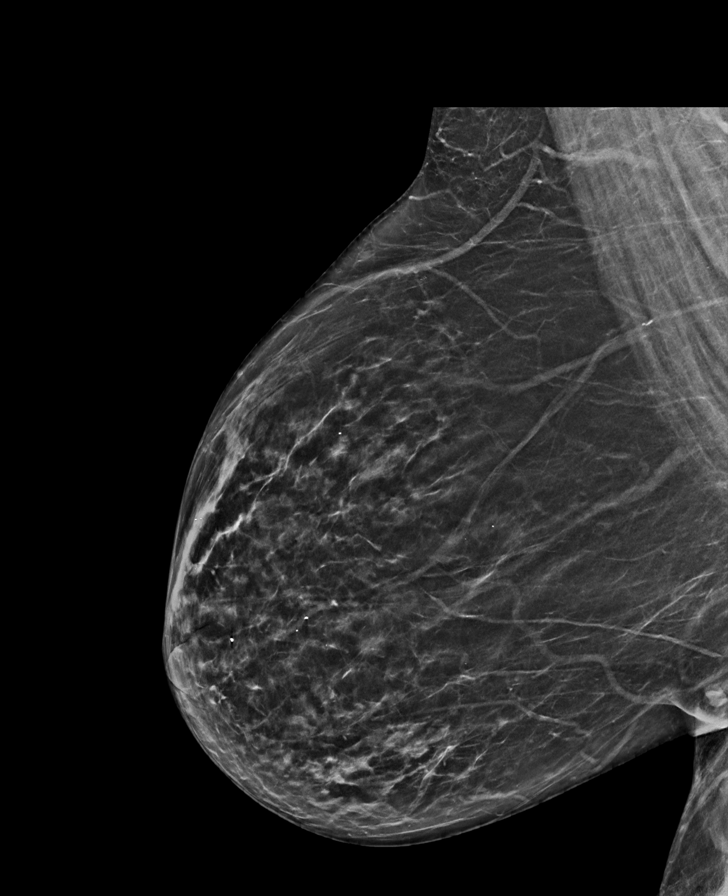

[L CC synth-2D]
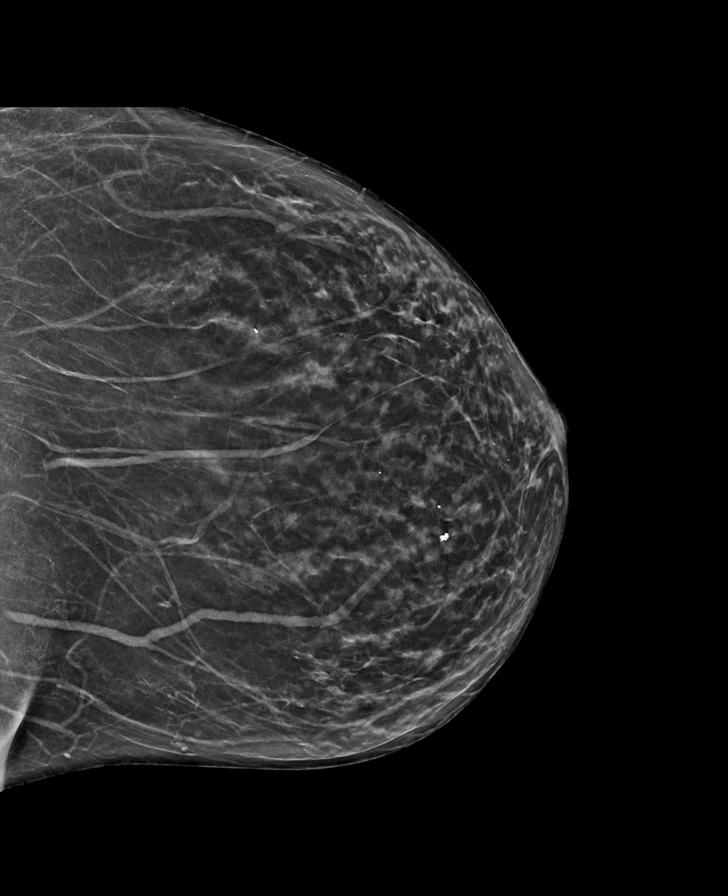

[R MLO tomo · tomo slice 34/67.0]
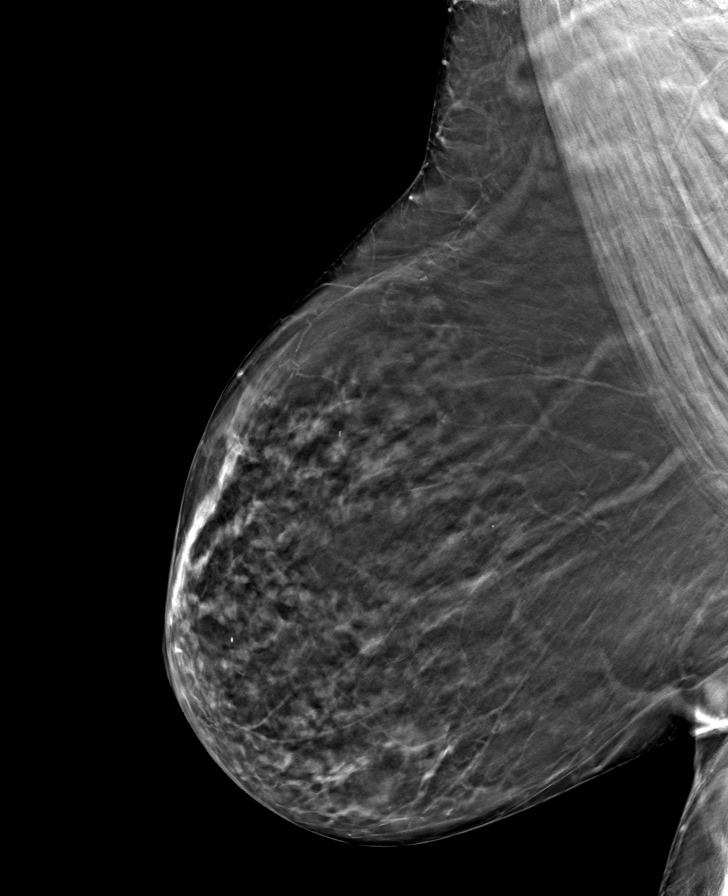

[L CC tomo · tomo slice 29/58.0]
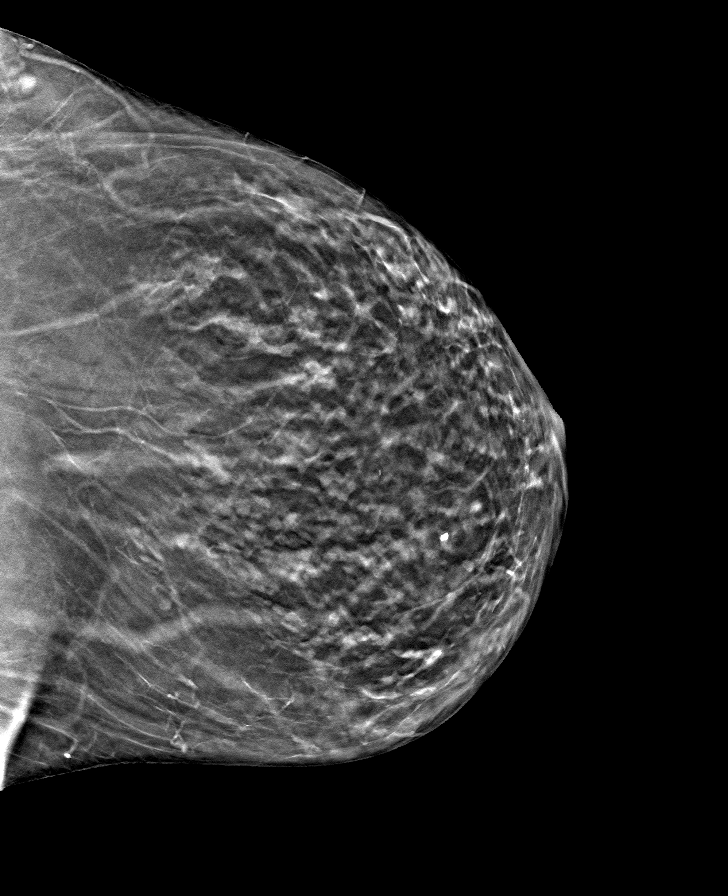

[R CC tomo · tomo slice 26/51.0]
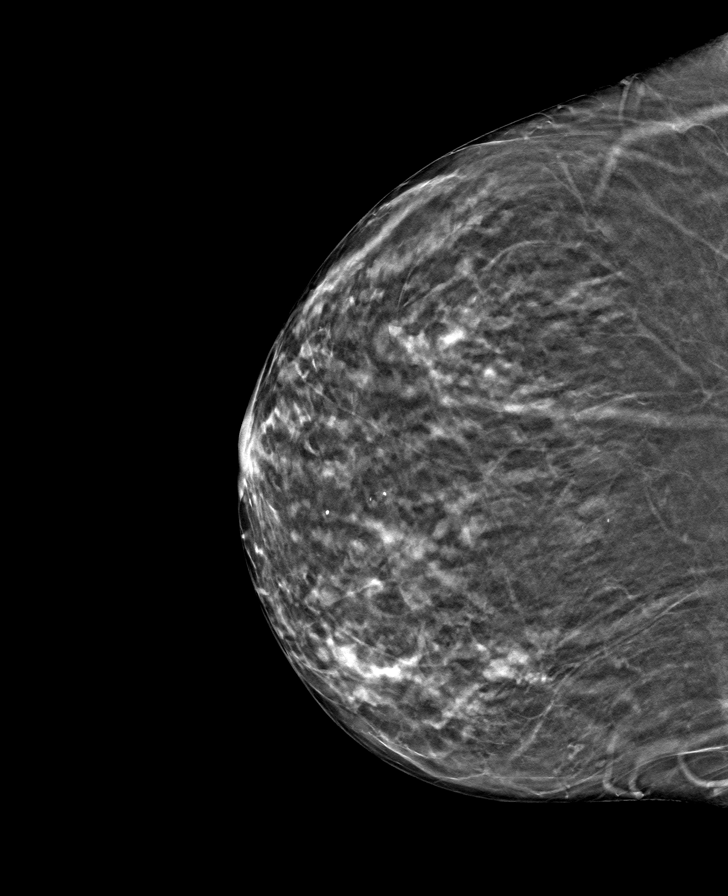

[L MLO tomo · tomo slice 35/70.0]
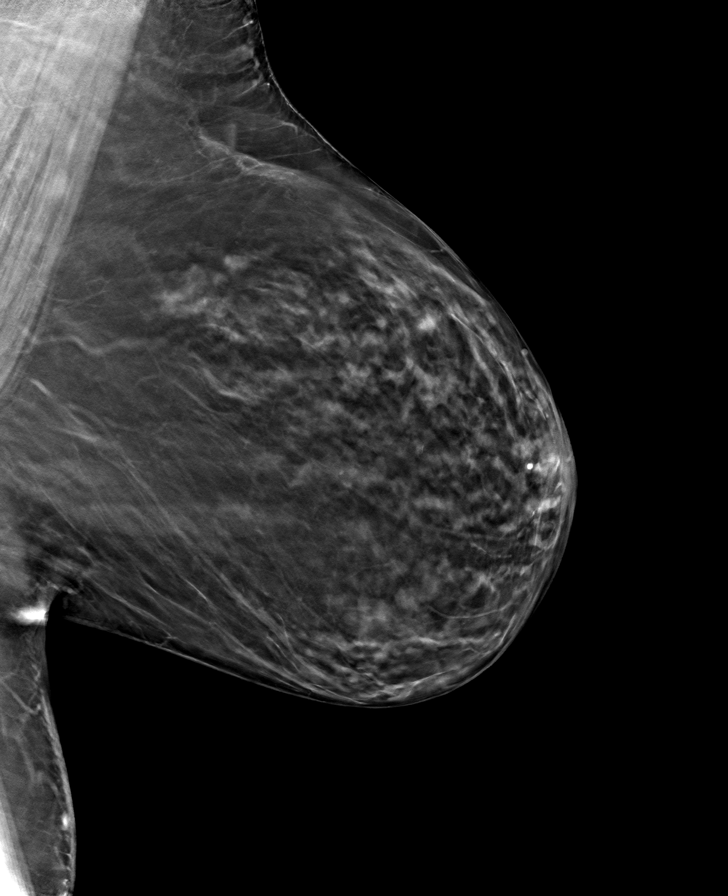

[8 of 24 positions shown; findings below may reference images not displayed]

ACR Breast Density Category b: There are scattered areas of
fibroglandular density.
FINDINGS: There are no findings suspicious for malignancy. Images were
processed with CAD.
IMPRESSION: No mammographic evidence of malignancy. A result letter of this
screening mammogram will be mailed directly to the patient.

RECOMMENDATION:
Screening mammogram in one year. (Code:[id number])

BI-RADS CATEGORY  1: Negative.

## 2021-05-04 ENCOUNTER — Encounter: Payer: Self-pay | Admitting: Hematology and Oncology

## 2021-05-04 ENCOUNTER — Other Ambulatory Visit: Payer: Self-pay

## 2021-05-04 ENCOUNTER — Inpatient Hospital Stay: Payer: 59 | Attending: Hematology and Oncology

## 2021-05-04 ENCOUNTER — Telehealth: Payer: Self-pay

## 2021-05-04 ENCOUNTER — Inpatient Hospital Stay: Payer: 59

## 2021-05-04 ENCOUNTER — Inpatient Hospital Stay (HOSPITAL_BASED_OUTPATIENT_CLINIC_OR_DEPARTMENT_OTHER): Payer: 59 | Admitting: Hematology and Oncology

## 2021-05-04 VITALS — BP 146/75 | HR 79 | Temp 98.2°F | Resp 18

## 2021-05-04 DIAGNOSIS — N183 Chronic kidney disease, stage 3 unspecified: Secondary | ICD-10-CM

## 2021-05-04 DIAGNOSIS — D539 Nutritional anemia, unspecified: Secondary | ICD-10-CM

## 2021-05-04 DIAGNOSIS — Z79899 Other long term (current) drug therapy: Secondary | ICD-10-CM | POA: Insufficient documentation

## 2021-05-04 DIAGNOSIS — E1122 Type 2 diabetes mellitus with diabetic chronic kidney disease: Secondary | ICD-10-CM | POA: Diagnosis not present

## 2021-05-04 DIAGNOSIS — D509 Iron deficiency anemia, unspecified: Secondary | ICD-10-CM

## 2021-05-04 DIAGNOSIS — S72492D Other fracture of lower end of left femur, subsequent encounter for closed fracture with routine healing: Secondary | ICD-10-CM | POA: Diagnosis not present

## 2021-05-04 DIAGNOSIS — D508 Other iron deficiency anemias: Secondary | ICD-10-CM

## 2021-05-04 DIAGNOSIS — M9712XD Periprosthetic fracture around internal prosthetic left knee joint, subsequent encounter: Secondary | ICD-10-CM | POA: Diagnosis not present

## 2021-05-04 LAB — COMPREHENSIVE METABOLIC PANEL
ALT: 21 U/L (ref 0–44)
AST: 24 U/L (ref 15–41)
Albumin: 3.8 g/dL (ref 3.5–5.0)
Alkaline Phosphatase: 80 U/L (ref 38–126)
Anion gap: 7 (ref 5–15)
BUN: 18 mg/dL (ref 8–23)
CO2: 26 mmol/L (ref 22–32)
Calcium: 9.3 mg/dL (ref 8.9–10.3)
Chloride: 104 mmol/L (ref 98–111)
Creatinine, Ser: 1.19 mg/dL — ABNORMAL HIGH (ref 0.44–1.00)
GFR, Estimated: 52 mL/min — ABNORMAL LOW (ref >60)
Glucose, Bld: 247 mg/dL — ABNORMAL HIGH (ref 70–99)
Potassium: 4.6 mmol/L (ref 3.5–5.1)
Sodium: 137 mmol/L (ref 135–145)
Total Bilirubin: 0.5 mg/dL (ref 0.3–1.2)
Total Protein: 6.8 g/dL (ref 6.5–8.1)

## 2021-05-04 LAB — FERRITIN: Ferritin: 199 ng/mL (ref 11–307)

## 2021-05-04 LAB — CBC WITH DIFFERENTIAL/PLATELET
Abs Immature Granulocytes: 0.12 10*3/uL — ABNORMAL HIGH (ref 0.00–0.07)
Basophils Absolute: 0.1 10*3/uL (ref 0.0–0.1)
Basophils Relative: 1 %
Eosinophils Absolute: 0.5 10*3/uL (ref 0.0–0.5)
Eosinophils Relative: 5 %
HCT: 32.2 % — ABNORMAL LOW (ref 36.0–46.0)
Hemoglobin: 10.8 g/dL — ABNORMAL LOW (ref 12.0–15.0)
Immature Granulocytes: 1 %
Lymphocytes Relative: 25 %
Lymphs Abs: 2.3 10*3/uL (ref 0.7–4.0)
MCH: 30.3 pg (ref 26.0–34.0)
MCHC: 33.5 g/dL (ref 30.0–36.0)
MCV: 90.4 fL (ref 80.0–100.0)
Monocytes Absolute: 0.6 10*3/uL (ref 0.1–1.0)
Monocytes Relative: 6 %
Neutro Abs: 5.6 10*3/uL (ref 1.7–7.7)
Neutrophils Relative %: 62 %
Platelets: 272 10*3/uL (ref 150–400)
RBC: 3.56 MIL/uL — ABNORMAL LOW (ref 3.87–5.11)
RDW: 12.7 % (ref 11.5–15.5)
WBC: 9.2 10*3/uL (ref 4.0–10.5)
nRBC: 0 % (ref 0.0–0.2)

## 2021-05-04 LAB — IRON AND TIBC
Iron: 82 ug/dL (ref 41–142)
Saturation Ratios: 20 % — ABNORMAL LOW (ref 21–57)
TIBC: 410 ug/dL (ref 236–444)
UIBC: 328 ug/dL (ref 120–384)

## 2021-05-04 MED ORDER — SODIUM CHLORIDE 0.9 % IV SOLN
300.0000 mg | Freq: Once | INTRAVENOUS | Status: AC
  Administered 2021-05-04: 300 mg via INTRAVENOUS
  Filled 2021-05-04: qty 300

## 2021-05-04 MED ORDER — SODIUM CHLORIDE 0.9 % IV SOLN
Freq: Once | INTRAVENOUS | Status: AC
  Filled 2021-05-04: qty 250

## 2021-05-04 NOTE — Telephone Encounter (Signed)
Called and given below message. She verbalized understanding. 

## 2021-05-04 NOTE — Progress Notes (Signed)
[facility] OFFICE PROGRESS NOTE  [name], MD  ASSESSMENT & PLAN:  Deficiency anemia I have reviewed blood work with the patient and her husband Overall, she has multifactorial anemia, combination of iron deficiency as well as B12 deficiency She is receiving B12 supplements through her primary care doctor's office She will receive 1 dose of iron sucrose today I will call her with results of iron studies; results show high ferritin.  We can space out her appointment to every 6 months We discussed the risk and benefits of oral vitamin B12 replacement therapy but she would like to proceed with B12 injections as scheduled through her primary care doctor for now  CKD (chronic kidney disease) stage 3, GFR 30-59 ml/min (HCC) She has intermittent chronic kidney disease secondary to cardiac issues and diabetes She will continue aggressive risk factor modification including drinking extra fluid during the hot summer times There could be a component of anemia chronic kidney disease but with her current level of hemoglobin, she does not need ESA  No orders of the defined types were placed in this encounter.   The total time spent in the appointment was 20 minutes encounter with patients including review of chart and various tests results, discussions about plan of care and coordination of care plan   All questions were answered. The patient knows to call the clinic with any problems, questions or concerns. No barriers to learning was detected.    Heath Lark, MD 7/25/20223:21 PM  INTERVAL HISTORY: [name] 60 y.o. female returns for further follow-up on multifactorial anemia including iron deficiency and B12 deficiency anemia She is here accompanied by her husband The patient denies any recent signs or symptoms of bleeding such as spontaneous epistaxis, hematuria or hematochezia. She continues to complain of excessive fatigue She usually feels better after IV  iron infusion  SUMMARY OF HEMATOLOGIC HISTORY: She is here accompanied by her husband I have the opportunity to review her blood test results from 2015 The patient has mild anemia over the last few years She has multiple chronic disease such as sarcoidosis, diabetes, congestive heart failure and others.   She has history of fundoplication.  She has multiple EGDs in the past, the last was in 2019 which showed no evidence of stomach ulcer or gastritis She takes chronic proton pump inhibitor Her hemoglobin stable between 10-11, most consistent with anemia of chronic disease  Around Halloween this year, she fell requiring left leg surgery.  Unfortunately, she developed complications with infection requiring IV antibiotics She also require blood transfusion intermittently According to her husband, advanced home care service has been drawing her blood from her PICC line and noted hemoglobin between 7.2-7.7 She has excessive fatigue This past Sunday, she had postural dizziness leading to a fall but she denies significant injuries She denies recent chest pain on exertion but has been somewhat sedentary since her surgery She was on chronic prednisone therapy but that was discontinued in October.  She use inhalers. She denies shortness of breath on minimal exertions or palpitations. She had not noticed any recent bleeding such as epistaxis, hematuria or hematochezia The patient denies over the counter NSAID ingestion. She is on antiplatelets agents and Lovenox since surgery  She had no prior history or diagnosis of cancer. Her age appropriate screening programs are up-to-date. She denies any pica and eats a variety of diet. She never donated blood  The patient was prescribed oral iron supplements and she takes 2 times a  day and she tolerated that poorly.  She has significant nausea She received 2 doses of IV iron sucrose in January 2022 Starting in March, 2022, she started to receive B12  injections  I have reviewed the past medical history, past surgical history, social history and family history with the patient and they are unchanged from previous note.  ALLERGIES:  is allergic to methotrexate, vancomycin, lisinopril, chlorhexidine, clindamycin/lincomycin, doxycycline, and teflaro [ceftaroline].  MEDICATIONS:  Current Outpatient Medications  Medication Sig Dispense Refill   albuterol (PROVENTIL HFA;VENTOLIN HFA) 108 (90 Base) MCG/ACT inhaler Inhale 1-2 puffs into the lungs every 6 (six) hours as needed for wheezing or shortness of breath. 8 g 5   allopurinol (ZYLOPRIM) 100 MG tablet Take 100 mg by mouth every evening.      aspirin EC 81 MG tablet Take 1 tablet (81 mg total) by mouth daily. 30 tablet 11   atorvastatin (LIPITOR) 40 MG tablet TAKE 1 TABLET BY MOUTH EVERY DAY 90 tablet 3   BREO ELLIPTA 200-25 MCG/INH AEPB TAKE 1 PUFF BY MOUTH EVERY DAY 60 each 5   buPROPion (WELLBUTRIN XL) 300 MG 24 hr tablet TAKE 1 TABLET BY MOUTH EVERY DAY 90 tablet 3   carvedilol (COREG) 12.5 MG tablet Take 12.5 mg by mouth 2 (two) times daily with a meal.     Cholecalciferol 25 MCG (1000 UT) capsule Take 1,000 Units by mouth in the morning and at bedtime.     diclofenac Sodium (VOLTAREN) 1 % GEL Apply 1 application topically as needed (pain.).      diltiazem (CARDIZEM CD) 120 MG 24 hr capsule Take 1 capsule (120 mg total) by mouth daily. 90 capsule 3   Dulaglutide (TRULICITY) 3 0000000 SOPN Inject 0.5 mLs (3 mg total) into the skin once a week. 12 pen 3   famotidine (PEPCID) 20 MG tablet Take 1 tablet (20 mg total) by mouth at bedtime. 30 tablet 3   fenofibrate 160 MG tablet TAKE 1 TABLET BY MOUTH EVERY DAY 90 tablet 3   fluticasone (FLONASE) 50 MCG/ACT nasal spray Place 2 sprays into both nostrils daily as needed for allergies. 48 mL 3   furosemide (LASIX) 40 MG tablet Take 1 tablet (40 mg total) by mouth daily. 180 tablet 1   gabapentin (NEURONTIN) 100 MG capsule TAKE 1 CAPSULE BY MOUTH  THREE TIMES A DAY 270 capsule 3   glucose blood (CONTOUR NEXT TEST) test strip 1 each by Other route 4 (four) times daily. And lancets 4/day 400 each 3   insulin glargine (LANTUS SOLOSTAR) 100 UNIT/ML Solostar Pen Inject 22 Units into the skin every morning. 30 mL 2   insulin lispro (HUMALOG KWIKPEN) 100 UNIT/ML KwikPen Inject 0.15 mLs (15 Units total) into the skin daily with supper. 15 mL 29   Insulin Pen Needle (PEN NEEDLES) 32G X 4 MM MISC 1 each by Does not apply route 4 (four) times daily. E11.9 400 each 0   ipratropium (ATROVENT) 0.03 % nasal spray PLACE 2 SPRAYS INTO BOTH NOSTRILS EVERY 12 (TWELVE) HOURS. 30 mL 2   KLOR-CON M20 20 MEQ tablet TAKE 1 & 1/2 TABLETS BY MOUTH TWICE A DAY 270 tablet 2   lactulose (CHRONULAC) 10 GM/15ML solution TAKE 15 MLS BY MOUTH DAILY AS NEEDED FOR CONSTIPATION. 1419 mL 1   Lancet Devices (EASY MINI EJECT LANCING DEVICE) MISC Use as directed 4 times daily to test blood sugar 1 each 3   linaclotide (LINZESS) 290 MCG CAPS capsule Take by mouth as  needed.     LORazepam (ATIVAN) 0.5 MG tablet Take 0.5 mg by mouth every 12 (twelve) hours as needed for anxiety.     meloxicam (MOBIC) 15 MG tablet Take 15 mg by mouth daily.     metFORMIN (GLUCOPHAGE-XR) 500 MG 24 hr tablet Take 4 tablets (2,000 mg total) by mouth daily with breakfast. 360 tablet 3   Microlet Lancets MISC 1 each by Does not apply route 2 (two) times daily. E11.9 100 each 2   nitroGLYCERIN (NITROSTAT) 0.4 MG SL tablet Place 1 tablet (0.4 mg total) under the tongue every 5 (five) minutes as needed for chest pain. Reported on 01/05/2016 25 tablet 6   nystatin (MYCOSTATIN) 100000 UNIT/ML suspension nystatin 100,000 unit/mL oral suspension  SWISH, GARGLE, AND SPIT 20 ML 4 TIMES A DAY UNTIL RESOLVED     ondansetron (ZOFRAN-ODT) 4 MG disintegrating tablet Take 1 tablet (4 mg total) by mouth every 8 (eight) hours as needed for nausea or vomiting. 50 tablet 2   RABEprazole (ACIPHEX) 20 MG tablet Take 1 tablet  (20 mg total) by mouth daily. 90 tablet 3   telmisartan (MICARDIS) 20 MG tablet TAKE 1 TABLET BY MOUTH EVERY DAY 90 tablet 1   tiZANidine (ZANAFLEX) 4 MG tablet Take 0.5-1 tablets (2-4 mg total) by mouth every 8 (eight) hours as needed for muscle spasms. 30 tablet 1   venlafaxine XR (EFFEXOR-XR) 75 MG 24 hr capsule TAKE 1 CAPSULE BY MOUTH TWICE A DAY 180 capsule 3   Current Facility-Administered Medications  Medication Dose Route Frequency Provider Last Rate Last Admin   cyanocobalamin ((VITAMIN B-12)) injection 1,000 mcg  1,000 mcg Intramuscular Once [name], MD         REVIEW OF SYSTEMS:   Constitutional: Denies fevers, chills or night sweats Eyes: Denies blurriness of vision Ears, nose, mouth, throat, and face: Denies mucositis or sore throat Respiratory: Denies cough, dyspnea or wheezes Cardiovascular: Denies palpitation, chest discomfort or lower extremity swelling Gastrointestinal:  Denies nausea, heartburn or change in bowel habits Skin: Denies abnormal skin rashes Lymphatics: Denies new lymphadenopathy or easy bruising Neurological:Denies numbness, tingling or new weaknesses Behavioral/Psych: Mood is stable, no new changes  All other systems were reviewed with the patient and are negative.  PHYSICAL EXAMINATION: ECOG PERFORMANCE STATUS: 1 - Symptomatic but completely ambulatory  Vitals:   05/04/21 1053  BP: (!) 142/57  Pulse: 85  Resp: 18  Temp: 97.8 F (36.6 C)  SpO2: 100%   Filed Weights   05/04/21 1053  Weight: 228 lb 12.8 oz (103.8 kg)    GENERAL:alert, no distress and comfortable Musculoskeletal:no cyanosis of digits and no clubbing  NEURO: alert & oriented x 3 with fluent speech, no focal motor/sensory deficits  LABORATORY DATA:  I have reviewed the data as listed     Component Value Date/Time   NA 137 05/04/2021 1031   NA 139 10/20/2020 0000   NA 135 (L) 04/17/2014 1039   K 4.6 05/04/2021 1031   K 4.5 04/17/2014 1039   CL 104 05/04/2021  1031   CO2 26 05/04/2021 1031   CO2 26 04/17/2014 1039   GLUCOSE 247 (H) 05/04/2021 1031   GLUCOSE 300 (H) 04/17/2014 1039   GLUCOSE 150 (H) 09/14/2006 1033   BUN 18 05/04/2021 1031   BUN 10 10/20/2020 0000   BUN 13.9 04/17/2014 1039   CREATININE 1.19 (H) 05/04/2021 1031   CREATININE 0.88 08/21/2020 1404   CREATININE 0.8 04/17/2014 1039   CALCIUM 9.3 05/04/2021 1031  CALCIUM 10.0 04/17/2014 1039   PROT 6.8 05/04/2021 1031   PROT 6.7 03/31/2020 1316   PROT 6.8 04/17/2014 1039   ALBUMIN 3.8 05/04/2021 1031   ALBUMIN 4.6 03/31/2020 1316   ALBUMIN 3.7 04/17/2014 1039   AST 24 05/04/2021 1031   AST 8 04/17/2014 1039   ALT 21 05/04/2021 1031   ALT 20 04/17/2014 1039   ALKPHOS 80 05/04/2021 1031   ALKPHOS 82 04/17/2014 1039   BILITOT 0.5 05/04/2021 1031   BILITOT 0.3 03/31/2020 1316   BILITOT 0.45 04/17/2014 1039   GFRNONAA 52 (L) 05/04/2021 1031   GFRNONAA 71 08/21/2020 1404   GFRAA 96 10/20/2020 0000   GFRAA 82 08/21/2020 1404    No results found for: SPEP, UPEP  Lab Results  Component Value Date   WBC 9.2 05/04/2021   NEUTROABS 5.6 05/04/2021   HGB 10.8 (L) 05/04/2021   HCT 32.2 (L) 05/04/2021   MCV 90.4 05/04/2021   PLT 272 05/04/2021      Chemistry      Component Value Date/Time   NA 137 05/04/2021 1031   NA 139 10/20/2020 0000   NA 135 (L) 04/17/2014 1039   K 4.6 05/04/2021 1031   K 4.5 04/17/2014 1039   CL 104 05/04/2021 1031   CO2 26 05/04/2021 1031   CO2 26 04/17/2014 1039   BUN 18 05/04/2021 1031   BUN 10 10/20/2020 0000   BUN 13.9 04/17/2014 1039   CREATININE 1.19 (H) 05/04/2021 1031   CREATININE 0.88 08/21/2020 1404   CREATININE 0.8 04/17/2014 1039   GLU 223 10/20/2020 0000      Component Value Date/Time   CALCIUM 9.3 05/04/2021 1031   CALCIUM 10.0 04/17/2014 1039   ALKPHOS 80 05/04/2021 1031   ALKPHOS 82 04/17/2014 1039   AST 24 05/04/2021 1031   AST 8 04/17/2014 1039   ALT 21 05/04/2021 1031   ALT 20 04/17/2014 1039   BILITOT 0.5  05/04/2021 1031   BILITOT 0.3 03/31/2020 1316   BILITOT 0.45 04/17/2014 1039

## 2021-05-04 NOTE — Patient Instructions (Signed)
Iron Sucrose injection What is this medication? IRON SUCROSE (AHY ern SOO krohs) is an iron complex. Iron is used to make healthy red blood cells, which carry oxygen and nutrients throughout the body. This medicine is used to treat iron deficiency anemia in people with chronickidney disease. This medicine may be used for other purposes; ask your health care provider orpharmacist if you have questions. COMMON BRAND NAME(S): Venofer What should I tell my care team before I take this medication? They need to know if you have any of these conditions: anemia not caused by low iron levels heart disease high levels of iron in the blood kidney disease liver disease an unusual or allergic reaction to iron, other medicines, foods, dyes, or preservatives pregnant or trying to get pregnant breast-feeding How should I use this medication? This medicine is for infusion into a vein. It is given by a health careprofessional in a hospital or clinic setting. Talk to your pediatrician regarding the use of this medicine in children. While this drug may be prescribed for children as young as 2 years for selectedconditions, precautions do apply. Overdosage: If you think you have taken too much of this medicine contact apoison control center or emergency room at once. NOTE: This medicine is only for you. Do not share this medicine with others. What if I miss a dose? It is important not to miss your dose. Call your doctor or health careprofessional if you are unable to keep an appointment. What may interact with this medication? Do not take this medicine with any of the following medications: deferoxamine dimercaprol other iron products This medicine may also interact with the following medications: chloramphenicol deferasirox This list may not describe all possible interactions. Give your health care provider a list of all the medicines, herbs, non-prescription drugs, or dietary supplements you use. Also tell  them if you smoke, drink alcohol, or use illegaldrugs. Some items may interact with your medicine. What should I watch for while using this medication? Visit your doctor or healthcare professional regularly. Tell your doctor or healthcare professional if your symptoms do not start to get better or if theyget worse. You may need blood work done while you are taking this medicine. You may need to follow a special diet. Talk to your doctor. Foods that contain iron include: whole grains/cereals, dried fruits, beans, or peas, leafy greenvegetables, and organ meats (liver, kidney). What side effects may I notice from receiving this medication? Side effects that you should report to your doctor or health care professionalas soon as possible: allergic reactions like skin rash, itching or hives, swelling of the face, lips, or tongue breathing problems changes in blood pressure cough fast, irregular heartbeat feeling faint or lightheaded, falls fever or chills flushing, sweating, or hot feelings joint or muscle aches/pains seizures swelling of the ankles or feet unusually weak or tired Side effects that usually do not require medical attention (report to yourdoctor or health care professional if they continue or are bothersome): diarrhea feeling achy headache irritation at site where injected nausea, vomiting stomach upset tiredness This list may not describe all possible side effects. Call your doctor for medical advice about side effects. You may report side effects to FDA at1-800-FDA-1088. Where should I keep my medication? This drug is given in a hospital or clinic and will not be stored at home. NOTE: This sheet is a summary. It may not cover all possible information. If you have questions about this medicine, talk to your doctor, pharmacist, orhealth care   provider.  2022 Elsevier/Gold Standard (2011-07-08 17:14:35)  

## 2021-05-04 NOTE — Assessment & Plan Note (Addendum)
She has intermittent chronic kidney disease secondary to cardiac issues and diabetes She will continue aggressive risk factor modification including drinking extra fluid during the hot summer times There could be a component of anemia chronic kidney disease but with her current level of hemoglobin, she does not need ESA

## 2021-05-04 NOTE — Assessment & Plan Note (Addendum)
I have reviewed blood work with the patient and her husband Overall, she has multifactorial anemia, combination of iron deficiency as well as B12 deficiency She is receiving B12 supplements through her primary care doctor's office She will receive 1 dose of iron sucrose today I will call her with results of iron studies; results show high ferritin.  We can space out her appointment to every 6 months We discussed the risk and benefits of oral vitamin B12 replacement therapy but she would like to proceed with B12 injections as scheduled through her primary care doctor for now

## 2021-05-04 NOTE — Telephone Encounter (Signed)
-----   Message from Heath Lark, MD sent at 05/04/2021 12:41 PM EDT ----- Pls call her Iron studies are good I plan to see her again in 6 months

## 2021-05-05 ENCOUNTER — Telehealth: Payer: Self-pay | Admitting: Hematology and Oncology

## 2021-05-05 NOTE — Telephone Encounter (Signed)
Scheduled per 7/25 sch msg. Called and spoke with pt confirmed added appts

## 2021-05-06 ENCOUNTER — Ambulatory Visit (INDEPENDENT_AMBULATORY_CARE_PROVIDER_SITE_OTHER): Payer: 59

## 2021-05-06 ENCOUNTER — Other Ambulatory Visit: Payer: Self-pay

## 2021-05-06 DIAGNOSIS — E538 Deficiency of other specified B group vitamins: Secondary | ICD-10-CM | POA: Diagnosis not present

## 2021-05-06 MED ORDER — CYANOCOBALAMIN 1000 MCG/ML IJ SOLN
1000.0000 ug | Freq: Once | INTRAMUSCULAR | Status: AC
  Administered 2021-05-06: 1000 ug via INTRAMUSCULAR

## 2021-05-06 NOTE — Progress Notes (Signed)
Per orders of Dr. Yong Channel, injection of B-12 given by Tobe Sos in right deltoid. Patient tolerated injection well. Patient will make appointment for 1 month.

## 2021-05-10 ENCOUNTER — Encounter (HOSPITAL_BASED_OUTPATIENT_CLINIC_OR_DEPARTMENT_OTHER): Payer: Self-pay | Admitting: Obstetrics and Gynecology

## 2021-05-10 ENCOUNTER — Other Ambulatory Visit: Payer: Self-pay

## 2021-05-10 ENCOUNTER — Emergency Department (HOSPITAL_BASED_OUTPATIENT_CLINIC_OR_DEPARTMENT_OTHER)
Admission: EM | Admit: 2021-05-10 | Discharge: 2021-05-10 | Disposition: A | Discharge status: Home / Self Care | Payer: 59 | Attending: Emergency Medicine | Admitting: Emergency Medicine

## 2021-05-10 DIAGNOSIS — Z794 Long term (current) use of insulin: Secondary | ICD-10-CM | POA: Insufficient documentation

## 2021-05-10 DIAGNOSIS — J45909 Unspecified asthma, uncomplicated: Secondary | ICD-10-CM | POA: Diagnosis not present

## 2021-05-10 DIAGNOSIS — Z7984 Long term (current) use of oral hypoglycemic drugs: Secondary | ICD-10-CM | POA: Insufficient documentation

## 2021-05-10 DIAGNOSIS — H9202 Otalgia, left ear: Secondary | ICD-10-CM | POA: Insufficient documentation

## 2021-05-10 DIAGNOSIS — I130 Hypertensive heart and chronic kidney disease with heart failure and stage 1 through stage 4 chronic kidney disease, or unspecified chronic kidney disease: Secondary | ICD-10-CM | POA: Diagnosis not present

## 2021-05-10 DIAGNOSIS — J449 Chronic obstructive pulmonary disease, unspecified: Secondary | ICD-10-CM | POA: Diagnosis not present

## 2021-05-10 DIAGNOSIS — E1122 Type 2 diabetes mellitus with diabetic chronic kidney disease: Secondary | ICD-10-CM | POA: Insufficient documentation

## 2021-05-10 DIAGNOSIS — N183 Chronic kidney disease, stage 3 unspecified: Secondary | ICD-10-CM | POA: Insufficient documentation

## 2021-05-10 DIAGNOSIS — E1140 Type 2 diabetes mellitus with diabetic neuropathy, unspecified: Secondary | ICD-10-CM | POA: Insufficient documentation

## 2021-05-10 DIAGNOSIS — Z79899 Other long term (current) drug therapy: Secondary | ICD-10-CM | POA: Insufficient documentation

## 2021-05-10 DIAGNOSIS — Z96653 Presence of artificial knee joint, bilateral: Secondary | ICD-10-CM | POA: Insufficient documentation

## 2021-05-10 DIAGNOSIS — Z7951 Long term (current) use of inhaled steroids: Secondary | ICD-10-CM | POA: Diagnosis not present

## 2021-05-10 DIAGNOSIS — Z955 Presence of coronary angioplasty implant and graft: Secondary | ICD-10-CM | POA: Diagnosis not present

## 2021-05-10 DIAGNOSIS — Z7982 Long term (current) use of aspirin: Secondary | ICD-10-CM | POA: Diagnosis not present

## 2021-05-10 DIAGNOSIS — I5032 Chronic diastolic (congestive) heart failure: Secondary | ICD-10-CM | POA: Insufficient documentation

## 2021-05-10 MED ORDER — KETOROLAC TROMETHAMINE 60 MG/2ML IM SOLN
30.0000 mg | Freq: Once | INTRAMUSCULAR | Status: AC
  Administered 2021-05-10: 30 mg via INTRAMUSCULAR
  Filled 2021-05-10: qty 2

## 2021-05-10 NOTE — Discharge Instructions (Addendum)
Recommend taking anti-inflammatory such as naproxen, Motrin or Mobic as needed for pain control.  Recommend following up with ENT.  If you develop fever, redness, swelling, worsening pain, come back to ER for reassessment.

## 2021-05-10 NOTE — ED Provider Notes (Signed)
[facility] EMERGENCY DEPT Provider Note   CSN: [id number] Arrival date & time: 05/10/21  2002     History Chief Complaint  Patient presents with   Otalgia    [name] is a 60 y.o. female.  Presented to ER with concern for ear pain.  Patient having pain in her left ear since yesterday.  No fevers or chills, no hearing loss.  No leaving aggravating symptoms.  Has not tried anything yet.  Pain is dull achy, constant.  Mild to moderate.  Denies history of frequent ear infections.  Has not seen ENT previously.  HPI     Past Medical History:  Diagnosis Date   Abnormal SPEP 04/17/2014   Acute left ankle pain 01/26/2017   ANEMIA-UNSPECIFIED 09/18/2009   Anxiety    Arthritis    Blood transfusion without reported diagnosis    Cataract    CHF (congestive heart failure) (HCC)    Chronic diastolic heart failure, NYHA class 2 (HCC)    Normal LVEDP by May 2018   COPD (chronic obstructive pulmonary disease) ([place])    Depression    DIABETES MELLITUS, TYPE II 08/21/2006   Diabetic osteomyelitis ([place]) 05/29/2015   Fatty liver    Fracture of 5th metatarsal    non union   GERD [id number]   Had fundoplication   GOUT [id number]   Hammer toe of right foot    2-5th toes   Hx of umbilical hernia repair    HYPERLIPIDEMIA 08/21/2006   HYPERTENSION 08/21/2006   Infection of wound due to methicillin resistant Staphylococcus aureus (MRSA)    Internal hemorrhoids    Kidney problem    Multiple allergies 10/14/2016   OBESITY 06/04/2009   Onychomycosis 10/27/2015   Osteomyelitis of left foot ([place]) 05/29/2015   Osteoporosis    Pulmonary sarcoidosis ([place])    Followed locally by pulmonology, but also by Dr. Casper Harrison at [facility]   Right knee pain 01/26/2017   Vocal cord dysfunction    Wears partial dentures     Patient Active Problem List   Diagnosis Date Noted   Class II obesity 02/02/2021   Iron deficiency anemia 10/01/2020   Postural dizziness with presyncope  09/30/2020   Hypertriglyceridemia 04/02/2020   Aortic atherosclerosis ([place]) 02/14/2020   Nontraumatic complete tear of left rotator cuff    Diabetic neuropathy ([place]) 02/20/2019   Severe persistent asthma 01/04/2019   Subcortical microvascular ischemic occlusive disease 07/12/2018   Rapid palpitations 07/12/2018   Impingement of left ankle joint 06/26/2018   Constipation 04/21/2018   Total knee replacement status, left 12/07/2017   Dysphagia 09/20/2017   Epiglottitis    Plantar fasciitis, right 07/13/2017   S/P total knee arthroplasty, right 06/29/2017   Osteoarthrosis, localized, primary, knee, right    Sprain of calcaneofibular ligament of right ankle 06/06/2017   Osteoarthritis of right knee 01/26/2017   Onychomycosis 10/27/2015   CKD (chronic kidney disease) stage 3, GFR 30-59 ml/min (HCC) 04/27/2015   MRSA (methicillin resistant staph aureus) culture positive 03/27/2015   History of osteomyelitis 03/27/2015   Fatty liver 09/30/2014   Hot flashes 07/15/2014   Abnormal SPEP 04/17/2014   Fracture of left leg 04/17/2014   Cushingoid side effect of steroids ([place]) 04/17/2014   Internal hemorrhoids    Pulmonary sarcoidosis ([place])    Major depression in full remission ([place])    Preoperative clearance 03/25/2014   Obstructive chronic bronchitis without exacerbation ([place]) 09/18/2013   Chest pain 04/11/2013   Hypertensive heart disease with chronic  diastolic congestive heart failure ([place])    Solitary pulmonary nodule, on CT 02/2013 - stable over 2 years in 2015 02/20/2013   Gout 08/20/2010   Deficiency anemia 09/18/2009   Morbid obesity ([place]) 06/04/2009   Sleep apnea 04/21/2009   Sarcoidosis of lung ([place]) 04/10/2007   Hyperlipidemia associated with type 2 diabetes mellitus ([place]) 08/21/2006   Hypertension associated with diabetes ([place]) 08/21/2006   GERD 08/21/2006   Type II diabetes mellitus with renal manifestations ([place]) 08/21/2006    Past Surgical History:  Procedure Laterality  Date   ABDOMINAL HYSTERECTOMY     APPENDECTOMY     BLADDER SUSPENSION  11/11/2011   Procedure: TRANSVAGINAL TAPE (TVT) PROCEDURE;  Surgeon: Olga Millers, MD;  Location: [facility];  Service: Gynecology;  Laterality: N/A;   CAROTIDS  02/18/11   CAROTID DUPLEX; VERTEBRALS ARE PATENT WITH ANTEGRADE FLOW. ICA/CCA RATIO 1.61 ON RIGHT AND 0.75 ON LEFT   CATARACT EXTRACTION, BILATERAL     CHOLECYSTECTOMY  1984   COLONOSCOPY  04/29/2010   [name]   CYSTOSCOPY  11/11/2011   Procedure: CYSTOSCOPY;  Surgeon: Olga Millers, MD;  Location: [facility];  Service: Gynecology;  Laterality: N/A;   EXTUBATION (ENDOTRACHEAL) IN OR N/A 09/23/2017   Procedure: EXTUBATION (ENDOTRACHEAL) IN OR;  Surgeon: Helayne Seminole, MD;  Location: [facility];  Service: ENT;  Laterality: N/A;   FIBEROPTIC LARYNGOSCOPY AND TRACHEOSCOPY N/A 09/23/2017   Procedure: FLEXIBLE FIBEROPTIC LARYNGOSCOPY;  Surgeon: Helayne Seminole, MD;  Location: [facility];  Service: ENT;  Laterality: N/A;   FRACTURE SURGERY     foot   [place] Left 10/02/2018   Procedure: HALLUX ARTHRODESIS;  Surgeon: Edrick Kins, DPM;  Location: [facility];  Service: Podiatry;  Laterality: Left;   HAMMER TOE SURGERY Left 10/02/2018   Procedure: HAMMER TOE CORRECTION 2ND 3RD 4RD FIFTH TOE;  Surgeon: Edrick Kins, DPM;  Location: [facility];  Service: Podiatry;  Laterality: Left;   HAMMER TOE SURGERY Right 04/12/2019   Procedure: HAMMER TOE CORRECTION, 2ND, 3RD, 4TH AND 5TH TOES OF RIGHT FOOT;  Surgeon: Edrick Kins, DPM;  Location: [facility];  Service: Podiatry;  Laterality: Right;   HAMMER TOE SURGERY Left 10/25/2019   Procedure: HAMMER TOE REPAIR SECOND, THIRD, FOUTH;  Surgeon: Edrick Kins, DPM;  Location: [facility];  Service: Podiatry;  Laterality: Left;   HERNIA REPAIR     I & D EXTREMITY Left 06/27/2015   Procedure: Partial Excision Left Calcaneus, Place Antibiotic Beads, and Wound VAC;  Surgeon: Newt Minion, MD;  Location: [facility];  Service: Orthopedics;  Laterality:  Left;   JOINT REPLACEMENT     KNEE ARTHROSCOPY     right   LEFT AND RIGHT HEART CATHETERIZATION WITH CORONARY ANGIOGRAM N/A 04/23/2013   Procedure: LEFT AND RIGHT HEART CATHETERIZATION WITH CORONARY ANGIOGRAM;  Surgeon: Leonie Man, MD;  Location: [facility] CATH LAB;  Service: Cardiovascular;  Laterality: N/A;   LEFT HEART CATH AND CORONARY ANGIOGRAPHY N/A 03/11/2017   Procedure: Left Heart Cath and Coronary Angiography;  Surgeon: Sherren Mocha, MD;  Location: [facility] CV LAB; angiographically minimal CAD in the LAD otherwise normal.;  Normal LVEDP.  FALSE POSITIVE MYOVIEW   LEFT HEART CATH AND CORONARY ANGIOGRAPHY  07/20/2010   LVEF 50-55% WITH VERY MILD GLOBAL HYPOKINESIA; ESSENTIALLY NORMAL CORONARY ARTERIES; NORMAL LV FUNCTION   METATARSAL OSTEOTOMY WITH OPEN REDUCTION INTERNAL FIXATION (ORIF) METATARSAL WITH FUSION Left 04/09/2014   Procedure: LEFT FOOT FRACTURE OPEN TREATMENT METATARSAL INCLUDES INTERNAL  FIXATION EACH;  Surgeon: Lorn Junes, MD;  Location: [facility];  Service: Orthopedics;  Laterality: Left;   NISSEN FUNDOPLICATION  [id number]   NM MYOVIEW LTD --> FALSE POSITIVE  03/10/2017   : Moderate size "stress-induced "perfusion defect at the apex as well as "ill-defined stress-induced perfusion defect in the lateral wall.  EF 55%.  INTERMEDIATE risk. -->  FALSE POSITIVE   ORIF DISTAL FEMUR FRACTURE  08/08/2020   [facility] [place]: ORIF of left extra-articular periprosthetic distal femur fracture with synthesis of the femur plate with cortical nonlocking screws.  (Thought to be pathologic fracture due to osteoporosis) -> after a fall   ORIF FEMUR W/ PERI-IMPLANT  08/29/2020   [facility]) revision of left femur fracture ORIF plate fixation screws; culture positive for MSSA   Right and left CARDIAC CATHETERIZATION  04/23/2013   Angiographic normal coronaries; LVEDP 20 mmHg, PCWP 12-14 mmHg, RAP 12 mmHg.; Fick CO/CI 4.9/2.2   SHOULDER ARTHROSCOPY Left  03/14/2019   Procedure: LEFT SHOULDER ARTHROSCOPY, DEBRIDEMENT, AND DECOMPRESSION;  Surgeon: Newt Minion, MD;  Location: [facility];  Service: Orthopedics;  Laterality: Left;   TOTAL KNEE ARTHROPLASTY Right 06/29/2017   Procedure: RIGHT TOTAL KNEE ARTHROPLASTY;  Surgeon: Newt Minion, MD;  Location: [facility];  Service: Orthopedics;  Laterality: Right;   TOTAL KNEE ARTHROPLASTY Left 12/07/2017   TOTAL KNEE ARTHROPLASTY Left 12/07/2017   Procedure: LEFT TOTAL KNEE ARTHROPLASTY;  Surgeon: Newt Minion, MD;  Location: [facility];  Service: Orthopedics;  Laterality: Left;   TRACHEOSTOMY TUBE PLACEMENT N/A 09/20/2017   Procedure: AWAKE INTUBATION WITH ANESTHESIA WITH VIDEO ASSISTANCE;  Surgeon: Helayne Seminole, MD;  Location: [facility];  Service: ENT;  Laterality: N/A;   TRANSTHORACIC ECHOCARDIOGRAM  08/2014   Normal LV size and function.  Mild LVH.  EF 55-60%.  Normal regional wall motion.  GR 1 DD.  Normal RV size and function .   TRANSTHORACIC ECHOCARDIOGRAM  08/12/2020    [facility]): Normal LV size and function.  EF 55 to 60%.  No RWM A.  Normal filling pattern.  Normal RV.  No significant valvular disease-stenosis or regurgitation.  No vegetation.   TUBAL LIGATION     with reversal in [place]       OB History     Gravida  2   Para  2   Term      Preterm      AB      Living  2      SAB      IAB      Ectopic      Multiple      Live Births              Family History  Problem Relation Age of Onset   Diabetes Father    Heart attack Father 10   Coronary artery disease Father    Heart failure Father    Throat cancer Father    Hypertension Father    Heart disease Father    Sleep apnea Father    Obesity Father    COPD Mother    Emphysema Mother    Asthma Mother    Heart failure Mother    Breast cancer Mother    Diabetes Mother    Kidney disease Mother    Thyroid disease Mother    Heart attack  Maternal Grandfather  Sarcoidosis Maternal Uncle    Lung cancer Brother    Diabetes Brother    Colon cancer Neg Hx    Colon polyps Neg Hx    Esophageal cancer Neg Hx    Rectal cancer Neg Hx    Stomach cancer Neg Hx     Social History   Tobacco Use   Smoking status: Never   Smokeless tobacco: Never  Vaping Use   Vaping Use: Never used  Substance Use Topics   Alcohol use: No    Alcohol/week: 0.0 standard drinks   Drug use: No    Home Medications Prior to Admission medications   Medication Sig Start Date End Date Taking? Authorizing Provider  albuterol (PROVENTIL HFA;VENTOLIN HFA) 108 (90 Base) MCG/ACT inhaler Inhale 1-2 puffs into the lungs every 6 (six) hours as needed for wheezing or shortness of breath. 01/04/19   [name], MD  allopurinol (ZYLOPRIM) 100 MG tablet Take 100 mg by mouth every evening.     [provider]  aspirin EC 81 MG tablet Take 1 tablet (81 mg total) by mouth daily. 11/03/20   Leonie Man, MD  atorvastatin (LIPITOR) 40 MG tablet TAKE 1 TABLET BY MOUTH EVERY DAY 09/08/20   [name], MD  BREO ELLIPTA 200-25 MCG/INH AEPB TAKE 1 PUFF BY MOUTH EVERY DAY 06/04/19   [name], MD  buPROPion (WELLBUTRIN XL) 300 MG 24 hr tablet TAKE 1 TABLET BY MOUTH EVERY DAY 01/12/21   [name], MD  carvedilol (COREG) 12.5 MG tablet Take 12.5 mg by mouth 2 (two) times daily with a meal.    [provider]  Cholecalciferol 25 MCG (1000 UT) capsule Take 1,000 Units by mouth in the morning and at bedtime. 10/26/11   [provider]  diclofenac Sodium (VOLTAREN) 1 % GEL Apply 1 application topically as needed (pain.).     [provider]  diltiazem (CARDIZEM CD) 120 MG 24 hr capsule Take 1 capsule (120 mg total) by mouth daily. 01/27/21   Leonie Man, MD  Dulaglutide (TRULICITY) 3 0000000 SOPN Inject 0.5 mLs (3 mg total) into the skin once a week. 04/30/20   Renato Shin, MD  famotidine (PEPCID) 20 MG tablet  Take 1 tablet (20 mg total) by mouth at bedtime. 12/13/18   Willia Craze, NP  fenofibrate 160 MG tablet TAKE 1 TABLET BY MOUTH EVERY DAY 12/29/20   Leonie Man, MD  fluticasone [facility]) 50 MCG/ACT nasal spray Place 2 sprays into both nostrils daily as needed for allergies. 01/22/20   [name], MD  furosemide (LASIX) 40 MG tablet Take 1 tablet (40 mg total) by mouth daily. 02/02/21   Heath Lark, MD  gabapentin (NEURONTIN) 100 MG capsule TAKE 1 CAPSULE BY MOUTH THREE TIMES A DAY 01/21/21   [name], MD  glucose blood (CONTOUR NEXT TEST) test strip 1 each by Other route 4 (four) times daily. And lancets 4/day 09/19/19   Renato Shin, MD  insulin glargine (LANTUS SOLOSTAR) 100 UNIT/ML Solostar Pen Inject 22 Units into the skin every morning. 04/29/21   Renato Shin, MD  insulin lispro (HUMALOG KWIKPEN) 100 UNIT/ML KwikPen Inject 0.15 mLs (15 Units total) into the skin daily with supper. 04/30/20   Renato Shin, MD  Insulin Pen Needle (PEN NEEDLES) 32G X 4 MM MISC 1 each by Does not apply route 4 (four) times daily. E11.9 02/22/20   Renato Shin, MD  ipratropium (ATROVENT) 0.03 % nasal spray PLACE  2 SPRAYS INTO BOTH NOSTRILS EVERY 12 (TWELVE) HOURS. 03/11/21   [name], MD  KLOR-CON M20 20 MEQ tablet TAKE 1 & 1/2 TABLETS BY MOUTH TWICE A DAY 10/09/20   [name], MD  lactulose (CHRONULAC) 10 GM/15ML solution TAKE 15 MLS BY MOUTH DAILY AS NEEDED FOR CONSTIPATION. 10/07/20   [name], MD  Lancet Devices (EASY MINI EJECT LANCING DEVICE) MISC Use as directed 4 times daily to test blood sugar 11/04/20   Renato Shin, MD  linaclotide [facility]) 290 MCG CAPS capsule Take by mouth as needed.    [provider]  LORazepam (ATIVAN) 0.5 MG tablet Take 0.5 mg by mouth every 12 (twelve) hours as needed for anxiety.    [provider]  meloxicam (MOBIC) 15 MG tablet Take 15 mg by mouth daily. 11/24/20   [provider]  metFORMIN (GLUCOPHAGE-XR)  500 MG 24 hr tablet Take 4 tablets (2,000 mg total) by mouth daily with breakfast. 04/29/21   Renato Shin, MD  Microlet Lancets MISC 1 each by Does not apply route 2 (two) times daily. E11.9 09/19/19   Renato Shin, MD  nitroGLYCERIN (NITROSTAT) 0.4 MG SL tablet Place 1 tablet (0.4 mg total) under the tongue every 5 (five) minutes as needed for chest pain. Reported on 01/05/2016 01/10/18   Leonie Man, MD  nystatin (MYCOSTATIN) 100000 UNIT/ML suspension nystatin 100,000 unit/mL oral suspension  SWISH, GARGLE, AND SPIT 20 ML 4 TIMES A DAY UNTIL RESOLVED    [provider]  ondansetron (ZOFRAN-ODT) 4 MG disintegrating tablet Take 1 tablet (4 mg total) by mouth every 8 (eight) hours as needed for nausea or vomiting. 12/13/18   Willia Craze, NP  RABEprazole (ACIPHEX) 20 MG tablet Take 1 tablet (20 mg total) by mouth daily. 04/20/21   [name], MD  telmisartan (MICARDIS) 20 MG tablet TAKE 1 TABLET BY MOUTH EVERY DAY 12/31/20   [name], MD  tiZANidine (ZANAFLEX) 4 MG tablet Take 0.5-1 tablets (2-4 mg total) by mouth every 8 (eight) hours as needed for muscle spasms. 07/03/20   Gregor Hams, MD  venlafaxine XR (EFFEXOR-XR) 75 MG 24 hr capsule TAKE 1 CAPSULE BY MOUTH TWICE A DAY 01/19/21   [name], MD  mupirocin nasal ointment (BACTROBAN) 2 % Place 1 application into the nose 2 (two) times daily. Use one-half of tube in each nostril twice daily for five (5) days. After application, press sides of nose together and gently massage. 03/28/18 03/28/18  [name], MD    Allergies    Methotrexate, Vancomycin, Lisinopril, Chlorhexidine, Clindamycin/lincomycin, Doxycycline, and Teflaro [ceftaroline]  Review of Systems   Review of Systems  Constitutional:  Negative for chills and fever.  HENT:  Negative for ear pain and sore throat.   Eyes:  Negative for pain and visual disturbance.  Respiratory:  Negative for cough and shortness of breath.   Cardiovascular:   Negative for chest pain and palpitations.  Gastrointestinal:  Negative for abdominal pain and vomiting.  Genitourinary:  Negative for dysuria and hematuria.  Musculoskeletal:  Negative for arthralgias and back pain.  Skin:  Negative for color change and rash.  Neurological:  Negative for seizures and syncope.  All other systems reviewed and are negative.  Physical Exam Updated Vital Signs BP (!) 172/73 (BP Location: Right Arm)   Pulse 86   Temp 98.4 F (36.9 C) (Oral)   Resp 18   Ht '5\' 6"'$  (1.676 m)   Wt  99.8 kg   SpO2 100%   BMI 35.51 kg/m   Physical Exam Vitals and nursing note reviewed.  Constitutional:      General: She is not in acute distress.    Appearance: She is well-developed.  HENT:     Head: Normocephalic and atraumatic.     Right Ear: Tympanic membrane, ear canal and external ear normal. There is no impacted cerumen.     Left Ear: Tympanic membrane, ear canal and external ear normal. There is no impacted cerumen.     Ears:     Comments: No tenderness to palpation over the mastoid process bilaterally Eyes:     Conjunctiva/sclera: Conjunctivae normal.  Neck:     Comments: There is some tenderness along the left SCM, no swelling, no induration, no erythema Cardiovascular:     Rate and Rhythm: Normal rate and regular rhythm.     Heart sounds: No murmur heard. Pulmonary:     Effort: Pulmonary effort is normal. No respiratory distress.     Breath sounds: Normal breath sounds.  Abdominal:     Palpations: Abdomen is soft.     Tenderness: There is no abdominal tenderness.  Musculoskeletal:     Cervical back: Neck supple.  Skin:    General: Skin is warm and dry.  Neurological:     Mental Status: She is alert.    ED Results / Procedures / Treatments   Labs (all labs ordered are listed, but only abnormal results are displayed) Labs Reviewed - No data to display  EKG None  Radiology No results found.  Procedures Procedures   Medications Ordered in  ED Medications  ketorolac (TORADOL) injection 30 mg (30 mg Intramuscular Given 05/10/21 2140)    ED Course  I have reviewed the triage vital signs and the nursing notes.  Pertinent labs & imaging results that were available during my care of the patient were reviewed by me and considered in my medical decision making (see chart for details).    MDM Rules/Calculators/A&P                           60 year old presents to ER with concern for left-sided otalgia.  On exam she appears well in no distress.  No fever.  Ear appears normal.  No signs or symptoms of otitis externa or media.  No tenderness over mastoid process.  She did have some tenderness over her lateral neck muscles.  No erythema, induration or swelling to suggest deeper infection.  Given her overall well appearance, believe she is appropriate for outpatient management I do not see need for further ER work-up today.  Reviewed return precautions with patient.  Recommend she have follow-up with an ENT specialist and try some anti-inflammatories for now in case this is MSK in etiology.   After the discussed management above, the patient was determined to be safe for discharge.  The patient was in agreement with this plan and all questions regarding their care were answered.  ED return precautions were discussed and the patient will return to the ED with any significant worsening of condition.  Final Clinical Impression(s) / ED Diagnoses Final diagnoses:  Otalgia of left ear    Rx / DC Orders ED Discharge Orders     None        Lucrezia Starch, MD 05/11/21 1930

## 2021-05-10 NOTE — ED Triage Notes (Signed)
Patient reports left sided ear pain since yesterday. Patient denies pain with swallowing, airway intact

## 2021-05-13 ENCOUNTER — Ambulatory Visit (INDEPENDENT_AMBULATORY_CARE_PROVIDER_SITE_OTHER): Payer: 59

## 2021-05-13 ENCOUNTER — Other Ambulatory Visit: Payer: Self-pay

## 2021-05-13 DIAGNOSIS — E538 Deficiency of other specified B group vitamins: Secondary | ICD-10-CM | POA: Diagnosis not present

## 2021-05-13 MED ORDER — CYANOCOBALAMIN 1000 MCG/ML IJ SOLN
1000.0000 ug | Freq: Once | INTRAMUSCULAR | Status: AC
  Administered 2021-05-13: 1000 ug via INTRAMUSCULAR

## 2021-05-13 NOTE — Progress Notes (Signed)
Per orders of Dr. Yong Channel, injection of B-12 given by Tobe Sos in left deltoid. Patient tolerated injection well. Patient will make appointment for 1 month.

## 2021-05-15 DIAGNOSIS — M542 Cervicalgia: Secondary | ICD-10-CM | POA: Diagnosis not present

## 2021-05-17 IMAGING — DX DG CHEST 1V PORT
1 series · 1 of 1 positions shown · non-contrast
Comparison: February 11, 2020

CLINICAL DATA: Cough, exposure to COVID

EXAM:
PORTABLE CHEST 1 VIEW

[chest ap]
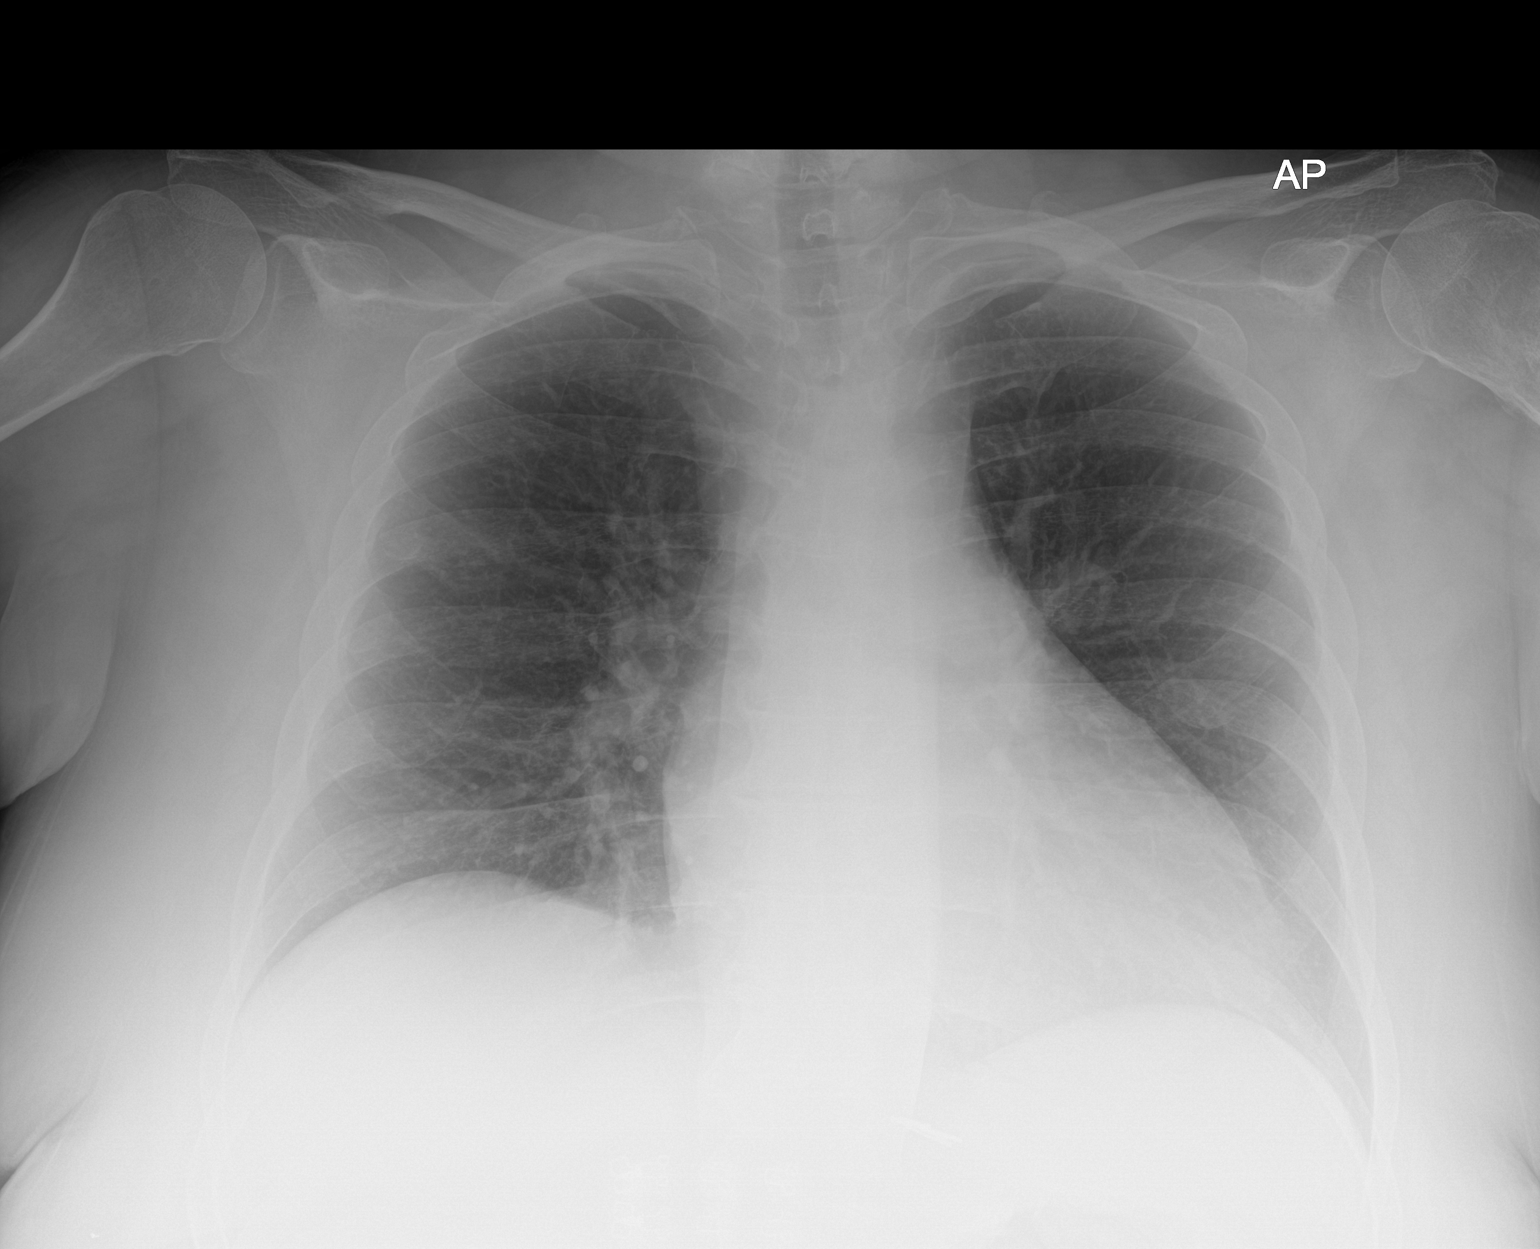

[1 of 1 positions shown; findings below may reference images not displayed]

FINDINGS: Trachea midline. Cardiomediastinal contours accentuated by portable
technique, stable compared to prior study.

Lungs are clear.  No pleural effusion.

On limited assessment no acute skeletal process.
IMPRESSION: No acute cardiopulmonary disease.

## 2021-05-19 ENCOUNTER — Other Ambulatory Visit: Payer: Self-pay | Admitting: Obstetrics & Gynecology

## 2021-05-19 DIAGNOSIS — Z1231 Encounter for screening mammogram for malignant neoplasm of breast: Secondary | ICD-10-CM

## 2021-05-27 ENCOUNTER — Ambulatory Visit (INDEPENDENT_AMBULATORY_CARE_PROVIDER_SITE_OTHER): Payer: 59 | Admitting: Surgery

## 2021-05-27 ENCOUNTER — Other Ambulatory Visit: Payer: Self-pay

## 2021-05-27 ENCOUNTER — Encounter: Payer: Self-pay | Admitting: Family Medicine

## 2021-05-27 ENCOUNTER — Ambulatory Visit: Payer: Self-pay

## 2021-05-27 ENCOUNTER — Encounter: Payer: Self-pay | Admitting: Surgery

## 2021-05-27 VITALS — BP 136/81 | HR 79 | Ht 72.0 in | Wt 228.6 lb

## 2021-05-27 DIAGNOSIS — M542 Cervicalgia: Secondary | ICD-10-CM

## 2021-05-27 MED ORDER — METHOCARBAMOL 500 MG PO TABS: 500.0000 mg | ORAL_TABLET | Freq: Three times a day (TID) | ORAL | 0 refills | Status: DC | PRN

## 2021-05-27 NOTE — Progress Notes (Signed)
neck  Office Visit Note   Patient: [name]           Date of Birth: Mar 04, 1961           MRN: [id number] Visit Date: 05/27/2021              Requested by: [name], MD [place],  [place] [id number] PCP: [name], MD   Assessment & Plan: Visit Diagnoses:  1. Neck pain   2. Cervicalgia     Plan: We will attempt conservative treatment.  We will schedule patient for formal PT and they can evaluate and treat as needed.  Sent in a prescription for Robaxin for spasms.  Have blood work drawn today to check a CBC and arthritis panel.  I think an anti-inflammatory would be of some benefit but I do not feel comfortable prescribing this patient today with her previous history.  She can talk to her primary care provider and see if they would like to prescribe an oral prednisone taper or see if its okay for her to use over-the-counter oral NSAID.  Follow-up me in 3 weeks for recheck.  We will decide at that time if cervical spine MRI is indicated.  Follow-Up Instructions: Return in about 3 weeks (around 06/17/2021) for with Oseas Detty recheck neck and review labs.   Orders:  Orders Placed This Encounter  Procedures   XR Cervical Spine 2 or 3 views   CBC   Sed Rate (ESR)   Antinuclear Antib (ANA)   Uric acid   Rheumatoid Factor   Ambulatory referral to Physical Therapy   Meds ordered this encounter  Medications   methocarbamol (ROBAXIN) 500 MG tablet    Sig: Take 1 tablet (500 mg total) by mouth every 8 (eight) hours as needed for muscle spasms.    Dispense:  40 tablet    Refill:  0      Procedures: No procedures performed   Clinical Data: No additional findings.   Subjective: Chief Complaint  Patient presents with   Neck - New Patient (Initial Visit)    HPI 60 year old white female who is new patient to clinic comes in today with complaints of worsening neck pain.  Pain ongoing x4 weeks.  No specific injury that she can recall.  States  that she has left-sided neck pain that radiates into the occipital region.  She also gets intermittent numbness and tingling in both hands.  She has had some pain in her left ear associated with this and was seen by her ENT at [facility] and they did not find anything that was contributing to her current problem.  She has also been having some headaches and neck spasms.  No pain radiating to the arms.  She has not been taking any oral NSAIDs.  She has a history of chronic kidney disease.  Patient was also on oral prednisone x20 years for sarcoidosis but this was recently discontinued.  Patient states she is had previous right shoulder rotator cuff repair by Dr. Elsie Saas several years ago. Review of Systems No current cardiac pulmonary GI GU issues  Objective: Vital Signs: BP 136/81 (BP Location: Right Arm, Patient Position: Sitting, Cuff Size: Normal)   Pulse 79   Ht 6' (1.829 m)   Wt 228 lb 9.6 oz (103.7 kg)   BMI 31.00 kg/m   Physical Exam HENT:     Head: Normocephalic and atraumatic.  Eyes:     Extraocular Movements: Extraocular movements  intact.  Pulmonary:     Effort: No respiratory distress.  Musculoskeletal:     Comments: Positive bilateral brachial plexus trapezius and scapular tenderness.  Both shoulders good range of motion but with some discomfort.  Positive bilateral shoulder impingement test.  Negative drop arm test.  Neurologically intact.  Neurological:     General: No focal deficit present.     Mental Status: She is alert and oriented to person, place, and time.  Psychiatric:        Mood and Affect: Mood normal.    Ortho Exam  Specialty Comments:  No specialty comments available.  Imaging: No results found.   PMFS History: Patient Active Problem List   Diagnosis Date Noted   Class II obesity 02/02/2021   Iron deficiency anemia 10/01/2020   Postural dizziness with presyncope 09/30/2020   Hypertriglyceridemia 04/02/2020   Aortic atherosclerosis ([place])  02/14/2020   Nontraumatic complete tear of left rotator cuff    Diabetic neuropathy ([place]) 02/20/2019   Severe persistent asthma 01/04/2019   Subcortical microvascular ischemic occlusive disease 07/12/2018   Rapid palpitations 07/12/2018   Impingement of left ankle joint 06/26/2018   Constipation 04/21/2018   Total knee replacement status, left 12/07/2017   Dysphagia 09/20/2017   Epiglottitis    Plantar fasciitis, right 07/13/2017   S/P total knee arthroplasty, right 06/29/2017   Osteoarthrosis, localized, primary, knee, right    Sprain of calcaneofibular ligament of right ankle 06/06/2017   Osteoarthritis of right knee 01/26/2017   Onychomycosis 10/27/2015   CKD (chronic kidney disease) stage 3, GFR 30-59 ml/min (HCC) 04/27/2015   MRSA (methicillin resistant staph aureus) culture positive 03/27/2015   History of osteomyelitis 03/27/2015   Fatty liver 09/30/2014   Hot flashes 07/15/2014   Abnormal SPEP 04/17/2014   Fracture of left leg 04/17/2014   Cushingoid side effect of steroids ([place]) 04/17/2014   Internal hemorrhoids    Pulmonary sarcoidosis ([place])    Major depression in full remission ([place])    Preoperative clearance 03/25/2014   Obstructive chronic bronchitis without exacerbation ([place]) 09/18/2013   Chest pain 04/11/2013   Hypertensive heart disease with chronic diastolic congestive heart failure ([place])    Solitary pulmonary nodule, on CT 02/2013 - stable over 2 years in 2015 02/20/2013   Gout 08/20/2010   Deficiency anemia 09/18/2009   Morbid obesity ([place]) 06/04/2009   Sleep apnea 04/21/2009   Sarcoidosis of lung ([place]) 04/10/2007   Hyperlipidemia associated with type 2 diabetes mellitus ([place]) 08/21/2006   Hypertension associated with diabetes ([place]) 08/21/2006   GERD 08/21/2006   Type II diabetes mellitus with renal manifestations ([place]) 08/21/2006   Past Medical History:  Diagnosis Date   Abnormal SPEP 04/17/2014   Acute left ankle pain 01/26/2017   ANEMIA-UNSPECIFIED  09/18/2009   Anxiety    Arthritis    Blood transfusion without reported diagnosis    Cataract    CHF (congestive heart failure) ([place])    Chronic diastolic heart failure, NYHA class 2 ([place])    Normal LVEDP by May 2018   COPD (chronic obstructive pulmonary disease) ([place])    Depression    DIABETES MELLITUS, TYPE II 08/21/2006   Diabetic osteomyelitis ([place]) 05/29/2015   Fatty liver    Fracture of 5th metatarsal    non union   GERD 24/23/5361   Had fundoplication   GOUT 44/31/5400   Hammer toe of right foot    2-5th toes   Hx of umbilical hernia repair    HYPERLIPIDEMIA 08/21/2006   HYPERTENSION  08/21/2006   Infection of wound due to methicillin resistant Staphylococcus aureus (MRSA)    Internal hemorrhoids    Kidney problem    Multiple allergies 10/14/2016   OBESITY 06/04/2009   Onychomycosis 10/27/2015   Osteomyelitis of left foot ([place]) 05/29/2015   Osteoporosis    Pulmonary sarcoidosis ([place])    Followed locally by pulmonology, but also by Dr. Casper Harrison at [facility]   Right knee pain 01/26/2017   Vocal cord dysfunction    Wears partial dentures     Family History  Problem Relation Age of Onset   Diabetes Father    Heart attack Father 30   Coronary artery disease Father    Heart failure Father    Throat cancer Father    Hypertension Father    Heart disease Father    Sleep apnea Father    Obesity Father    COPD Mother    Emphysema Mother    Asthma Mother    Heart failure Mother    Breast cancer Mother    Diabetes Mother    Kidney disease Mother    Thyroid disease Mother    Heart attack Maternal Grandfather    Sarcoidosis Maternal Uncle    Lung cancer Brother    Diabetes Brother    Colon cancer Neg Hx    Colon polyps Neg Hx    Esophageal cancer Neg Hx    Rectal cancer Neg Hx    Stomach cancer Neg Hx     Past Surgical History:  Procedure Laterality Date   ABDOMINAL HYSTERECTOMY     APPENDECTOMY     BLADDER SUSPENSION  11/11/2011   Procedure:  TRANSVAGINAL TAPE (TVT) PROCEDURE;  Surgeon: Olga Millers, MD;  Location: [facility];  Service: Gynecology;  Laterality: N/A;   CAROTIDS  02/18/11   CAROTID DUPLEX; VERTEBRALS ARE PATENT WITH ANTEGRADE FLOW. ICA/CCA RATIO 1.61 ON RIGHT AND 0.75 ON LEFT   CATARACT EXTRACTION, BILATERAL     CHOLECYSTECTOMY  1984   COLONOSCOPY  04/29/2010   [name]   CYSTOSCOPY  11/11/2011   Procedure: CYSTOSCOPY;  Surgeon: Olga Millers, MD;  Location: [facility];  Service: Gynecology;  Laterality: N/A;   EXTUBATION (ENDOTRACHEAL) IN OR N/A 09/23/2017   Procedure: EXTUBATION (ENDOTRACHEAL) IN OR;  Surgeon: Helayne Seminole, MD;  Location: [facility];  Service: ENT;  Laterality: N/A;   FIBEROPTIC LARYNGOSCOPY AND TRACHEOSCOPY N/A 09/23/2017   Procedure: FLEXIBLE FIBEROPTIC LARYNGOSCOPY;  Surgeon: Helayne Seminole, MD;  Location: [facility];  Service: ENT;  Laterality: N/A;   FRACTURE SURGERY     foot   [place] Left 10/02/2018   Procedure: HALLUX ARTHRODESIS;  Surgeon: Edrick Kins, DPM;  Location: [facility];  Service: Podiatry;  Laterality: Left;   HAMMER TOE SURGERY Left 10/02/2018   Procedure: HAMMER TOE CORRECTION 2ND 3RD 4RD FIFTH TOE;  Surgeon: Edrick Kins, DPM;  Location: [facility];  Service: Podiatry;  Laterality: Left;   HAMMER TOE SURGERY Right 04/12/2019   Procedure: HAMMER TOE CORRECTION, 2ND, 3RD, 4TH AND 5TH TOES OF RIGHT FOOT;  Surgeon: Edrick Kins, DPM;  Location: [facility];  Service: Podiatry;  Laterality: Right;   HAMMER TOE SURGERY Left 10/25/2019   Procedure: HAMMER TOE REPAIR SECOND, THIRD, FOUTH;  Surgeon: Edrick Kins, DPM;  Location: [facility];  Service: Podiatry;  Laterality: Left;   HERNIA REPAIR     I & D EXTREMITY Left 06/27/2015   Procedure: Partial Excision Left Calcaneus, Place Antibiotic Beads, and  Wound VAC;  Surgeon: Newt Minion, MD;  Location: [facility];  Service: Orthopedics;  Laterality: Left;   JOINT REPLACEMENT     KNEE ARTHROSCOPY     right   LEFT AND RIGHT HEART CATHETERIZATION  WITH CORONARY ANGIOGRAM N/A 04/23/2013   Procedure: LEFT AND RIGHT HEART CATHETERIZATION WITH CORONARY ANGIOGRAM;  Surgeon: Leonie Man, MD;  Location: [facility] C[facility] LAB;  Service: Cardiovascular;  Laterality: N/A;   LEFT HEART CATH AND CORONARY ANGIOGRAPHY N/A 03/11/2017   Procedure: Left Heart Cath and Coronary Angiography;  Surgeon: Sherren Mocha, MD;  Location: [facility]; angiographically minimal CAD in the LAD otherwise normal.;  Normal LVEDP.  FALSE POSITIVE MYOVIEW   LEFT HEART CATH AND CORONARY ANGIOGRAPHY  07/20/2010   LVEF 50-55% WITH VERY MILD GLOBAL HYPOKINESIA; ESSENTIALLY NORMAL CORONARY ARTERIES; NORMAL LV FUNCTION   METATARSAL OSTEOTOMY WITH OPEN REDUCTION INTERNAL FIXATION (ORIF) METATARSAL WITH FUSION Left 04/09/2014   Procedure: LEFT FOOT FRACTURE OPEN TREATMENT METATARSAL INCLUDES INTERNAL FIXATION EACH;  Surgeon: Lorn Junes, MD;  Location: [facility];  Service: Orthopedics;  Laterality: Left;   NISSEN FUNDOPLICATION  9373   NM MYOVIEW LTD --> FALSE POSITIVE  03/10/2017   : Moderate size "stress-induced "perfusion defect at the apex as well as "ill-defined stress-induced perfusion defect in the lateral wall.  EF 55%.  INTERMEDIATE risk. -->  FALSE POSITIVE   ORIF DISTAL FEMUR FRACTURE  08/08/2020   [facility]: ORIF of left extra-articular periprosthetic distal femur fracture with synthesis of the femur plate with cortical nonlocking screws.  (Thought to be pathologic fracture due to osteoporosis) -> after a fall   ORIF FEMUR W/ PERI-IMPLANT  08/29/2020   [facility]) revision of left femur fracture ORIF plate fixation screws; culture positive for MSSA   Right and left CARDIAC CATHETERIZATION  04/23/2013   Angiographic normal coronaries; LVEDP 20 mmHg, PCWP 12-14 mmHg, RAP 12 mmHg.; Fick CO/CI 4.9/2.2   SHOULDER ARTHROSCOPY Left 03/14/2019   Procedure: LEFT SHOULDER ARTHROSCOPY, DEBRIDEMENT, AND DECOMPRESSION;  Surgeon: Newt Minion, MD;  Location: [facility];  Service: Orthopedics;  Laterality: Left;   TOTAL KNEE ARTHROPLASTY Right 06/29/2017   Procedure: RIGHT TOTAL KNEE ARTHROPLASTY;  Surgeon: Newt Minion, MD;  Location: [facility];  Service: Orthopedics;  Laterality: Right;   TOTAL KNEE ARTHROPLASTY Left 12/07/2017   TOTAL KNEE ARTHROPLASTY Left 12/07/2017   Procedure: LEFT TOTAL KNEE ARTHROPLASTY;  Surgeon: Newt Minion, MD;  Location: [facility];  Service: Orthopedics;  Laterality: Left;   TRACHEOSTOMY TUBE PLACEMENT N/A 09/20/2017   Procedure: AWAKE INTUBATION WITH ANESTHESIA WITH VIDEO ASSISTANCE;  Surgeon: Helayne Seminole, MD;  Location: [facility];  Service: ENT;  Laterality: N/A;   TRANSTHORACIC ECHOCARDIOGRAM  08/2014   Normal LV size and function.  Mild LVH.  EF 55-60%.  Normal regional wall motion.  GR 1 DD.  Normal RV size and function .   TRANSTHORACIC ECHOCARDIOGRAM  08/12/2020    [facility]): Normal LV size and function.  EF 55 to 60%.  No RWM A.  Normal filling pattern.  Normal RV.  No significant valvular disease-stenosis or regurgitation.  No vegetation.   TUBAL LIGATION     with reversal in 1994   UPPER GASTROINTESTINAL ENDOSCOPY     VENTRAL HERNIA REPAIR     Social History   Occupational History   Occupation: DISABLED  Tobacco Use   Smoking status: Never   Smokeless tobacco: Never  Vaping Use   Vaping Use: Never used  Substance and Sexual Activity   Alcohol use: No    Alcohol/week: 0.0 standard drinks   Drug use: No   Sexual activity: Yes    Birth control/protection: Surgical

## 2021-05-29 ENCOUNTER — Other Ambulatory Visit: Payer: Self-pay | Admitting: Endocrinology

## 2021-05-29 LAB — CBC
HCT: 34.4 % — ABNORMAL LOW (ref 35.0–45.0)
Hemoglobin: 11.2 g/dL — ABNORMAL LOW (ref 11.7–15.5)
MCH: 29.7 pg (ref 27.0–33.0)
MCHC: 32.6 g/dL (ref 32.0–36.0)
MCV: 91.2 fL (ref 80.0–100.0)
MPV: 9.6 fL (ref 7.5–12.5)
Platelets: 309 10*3/uL (ref 140–400)
RBC: 3.77 10*6/uL — ABNORMAL LOW (ref 3.80–5.10)
RDW: 12.4 % (ref 11.0–15.0)
WBC: 9.2 10*3/uL (ref 3.8–10.8)

## 2021-05-29 LAB — RHEUMATOID FACTOR: Rheumatoid fact SerPl-aCnc: <14 IU/mL (ref <14)

## 2021-05-29 LAB — ANA: Anti Nuclear Antibody (ANA): NEGATIVE

## 2021-05-29 LAB — SEDIMENTATION RATE: Sed Rate: 9 mm/h (ref 0–30)

## 2021-05-29 LAB — URIC ACID: Uric Acid, Serum: 7.6 mg/dL — ABNORMAL HIGH (ref 2.5–7.0)

## 2021-06-01 ENCOUNTER — Encounter: Payer: Self-pay | Admitting: Physical Therapy

## 2021-06-01 ENCOUNTER — Ambulatory Visit (INDEPENDENT_AMBULATORY_CARE_PROVIDER_SITE_OTHER): Payer: 59 | Admitting: Physical Therapy

## 2021-06-01 ENCOUNTER — Other Ambulatory Visit: Payer: Self-pay

## 2021-06-01 DIAGNOSIS — M25512 Pain in left shoulder: Secondary | ICD-10-CM | POA: Diagnosis not present

## 2021-06-01 DIAGNOSIS — M25511 Pain in right shoulder: Secondary | ICD-10-CM | POA: Diagnosis not present

## 2021-06-01 DIAGNOSIS — M6281 Muscle weakness (generalized): Secondary | ICD-10-CM | POA: Diagnosis not present

## 2021-06-01 DIAGNOSIS — G8929 Other chronic pain: Secondary | ICD-10-CM

## 2021-06-01 DIAGNOSIS — M542 Cervicalgia: Secondary | ICD-10-CM

## 2021-06-01 DIAGNOSIS — R293 Abnormal posture: Secondary | ICD-10-CM

## 2021-06-01 NOTE — Patient Instructions (Signed)
Access Code: [email or web address] URL: [email or web address] Date: 06/01/2021 Prepared by: Kearney Hard  Exercises Seated Upper Trap Stretch - 2-3 x daily - 7 x weekly - 10 reps - 10 seconds hold Gentle Levator Scapulae Stretch - 2-3 x daily - 7 x weekly - 10 reps - 10 seconds hold Supine Cervical Retraction with Towel - 2-3 x daily - 7 x weekly - 2 sets - 10 reps - 10 seconds hold Standing Row with Resistance - 2-3 x daily - 7 x weekly - 2 sets - 10 reps - 3 seconds hold

## 2021-06-01 NOTE — Therapy (Signed)
[place], [name], [address] Phone: [phone]   Fax:  [phone]  Physical Therapy Evaluation  Patient Details  Name: [name] MRN: [id number] Date of Birth: 10-10-1961 Referring Provider (PT): Benjiman Core, [place]   Encounter Date: 06/01/2021   PT End of Session - 06/01/21 1144     Visit Number 1    Number of Visits 12    Progress Note Due on Visit 10    PT Start Time 1100    PT Stop Time 1140    PT Time Calculation (min) 40 min    Activity Tolerance Patient tolerated treatment well    Behavior During Therapy [facility] for tasks assessed/performed             Past Medical History:  Diagnosis Date   Abnormal SPEP 04/17/2014   Acute left ankle pain 01/26/2017   ANEMIA-UNSPECIFIED 09/18/2009   Anxiety    Arthritis    Blood transfusion without reported diagnosis    Cataract    CHF (congestive heart failure) ([place])    Chronic diastolic heart failure, NYHA class 2 ([place])    Normal LVEDP by May 2018   COPD (chronic obstructive pulmonary disease) ([place])    Depression    DIABETES MELLITUS, TYPE II 08/21/2006   Diabetic osteomyelitis ([place]) 05/29/2015   Fatty liver    Fracture of 5th metatarsal    non union   GERD 62/83/6629   Had fundoplication   GOUT 47/65/4650   Hammer toe of right foot    2-5th toes   Hx of umbilical hernia repair    HYPERLIPIDEMIA 08/21/2006   HYPERTENSION 08/21/2006   Infection of wound due to methicillin resistant Staphylococcus aureus (MRSA)    Internal hemorrhoids    Kidney problem    Multiple allergies 10/14/2016   OBESITY 06/04/2009   Onychomycosis 10/27/2015   Osteomyelitis of left foot ([place]) 05/29/2015   Osteoporosis    Pulmonary sarcoidosis (HCC)    Followed locally by pulmonology, but also by Dr. Casper Harrison at [facility]   Right knee pain 01/26/2017   Vocal cord dysfunction    Wears partial dentures     Past Surgical History:  Procedure Laterality Date   ABDOMINAL  HYSTERECTOMY     APPENDECTOMY     BLADDER SUSPENSION  11/11/2011   Procedure: TRANSVAGINAL TAPE (TVT) PROCEDURE;  Surgeon: Olga Millers, MD;  Location: [facility];  Service: Gynecology;  Laterality: N/A;   CAROTIDS  02/18/11   CAROTID DUPLEX; VERTEBRALS ARE PATENT WITH ANTEGRADE FLOW. ICA/CCA RATIO 1.61 ON RIGHT AND 0.75 ON LEFT   CATARACT EXTRACTION, BILATERAL     CHOLECYSTECTOMY  1984   COLONOSCOPY  04/29/2010   [name]   CYSTOSCOPY  11/11/2011   Procedure: CYSTOSCOPY;  Surgeon: Olga Millers, MD;  Location: [place] [facility];  Service: Gynecology;  Laterality: N/A;   EXTUBATION (ENDOTRACHEAL) IN OR N/A 09/23/2017   Procedure: EXTUBATION (ENDOTRACHEAL) IN OR;  Surgeon: Helayne Seminole, MD;  Location: [place];  Service: ENT;  Laterality: N/A;   FIBEROPTIC LARYNGOSCOPY AND TRACHEOSCOPY N/A 09/23/2017   Procedure: FLEXIBLE FIBEROPTIC LARYNGOSCOPY;  Surgeon: Helayne Seminole, MD;  Location: [place];  Service: ENT;  Laterality: N/A;   FRACTURE SURGERY     foot   [place] Left 10/02/2018   Procedure: HALLUX ARTHRODESIS;  Surgeon: Edrick Kins, DPM;  Location: [facility];  Service: Podiatry;  Laterality: Left;   HAMMER TOE SURGERY Left 10/02/2018   Procedure: HAMMER TOE CORRECTION  2ND 3RD 4RD FIFTH TOE;  Surgeon: Edrick Kins, DPM;  Location: [facility];  Service: Podiatry;  Laterality: Left;   HAMMER TOE SURGERY Right 04/12/2019   Procedure: HAMMER TOE CORRECTION, 2ND, 3RD, 4TH AND 5TH TOES OF RIGHT FOOT;  Surgeon: Edrick Kins, DPM;  Location: [place];  Service: Podiatry;  Laterality: Right;   HAMMER TOE SURGERY Left 10/25/2019   Procedure: HAMMER TOE REPAIR SECOND, THIRD, FOUTH;  Surgeon: Edrick Kins, DPM;  Location: [place] [facility];  Service: Podiatry;  Laterality: Left;   HERNIA REPAIR     I & D EXTREMITY Left 06/27/2015   Procedure: Partial Excision Left Calcaneus, Place Antibiotic Beads, and Wound VAC;  Surgeon: Newt Minion, MD;  Location: [place];  Service: Orthopedics;  Laterality: Left;   JOINT  REPLACEMENT     KNEE ARTHROSCOPY     right   LEFT AND RIGHT HEART CATHETERIZATION WITH CORONARY ANGIOGRAM N/A 04/23/2013   Procedure: LEFT AND RIGHT HEART CATHETERIZATION WITH CORONARY ANGIOGRAM;  Surgeon: Leonie Man, MD;  Location: [facility] CATH LAB;  Service: Cardiovascular;  Laterality: N/A;   LEFT HEART CATH AND CORONARY ANGIOGRAPHY N/A 03/11/2017   Procedure: Left Heart Cath and Coronary Angiography;  Surgeon: Sherren Mocha, MD;  Location: [place] CV LAB; angiographically minimal CAD in the LAD otherwise normal.;  Normal LVEDP.  FALSE POSITIVE MYOVIEW   LEFT HEART CATH AND CORONARY ANGIOGRAPHY  07/20/2010   LVEF 50-55% WITH VERY MILD GLOBAL HYPOKINESIA; ESSENTIALLY NORMAL CORONARY ARTERIES; NORMAL LV FUNCTION   METATARSAL OSTEOTOMY WITH OPEN REDUCTION INTERNAL FIXATION (ORIF) METATARSAL WITH FUSION Left 04/09/2014   Procedure: LEFT FOOT FRACTURE OPEN TREATMENT METATARSAL INCLUDES INTERNAL FIXATION EACH;  Surgeon: Lorn Junes, MD;  Location: [place];  Service: Orthopedics;  Laterality: Left;   NISSEN FUNDOPLICATION  8119   NM MYOVIEW LTD --> FALSE POSITIVE  03/10/2017   : Moderate size "stress-induced "perfusion defect at the apex as well as "ill-defined stress-induced perfusion defect in the lateral wall.  EF 55%.  INTERMEDIATE risk. -->  FALSE POSITIVE   ORIF DISTAL FEMUR FRACTURE  08/08/2020   [facility] [place]: ORIF of left extra-articular periprosthetic distal femur fracture with synthesis of the femur plate with cortical nonlocking screws.  (Thought to be pathologic fracture due to osteoporosis) -> after a fall   ORIF FEMUR W/ PERI-IMPLANT  08/29/2020   [facility]) revision of left femur fracture ORIF plate fixation screws; culture positive for MSSA   Right and left CARDIAC CATHETERIZATION  04/23/2013   Angiographic normal coronaries; LVEDP 20 mmHg, PCWP 12-14 mmHg, RAP 12 mmHg.; Fick CO/CI 4.9/2.2   SHOULDER ARTHROSCOPY Left 03/14/2019    Procedure: LEFT SHOULDER ARTHROSCOPY, DEBRIDEMENT, AND DECOMPRESSION;  Surgeon: Newt Minion, MD;  Location: [facility];  Service: Orthopedics;  Laterality: Left;   TOTAL KNEE ARTHROPLASTY Right 06/29/2017   Procedure: RIGHT TOTAL KNEE ARTHROPLASTY;  Surgeon: Newt Minion, MD;  Location: [place];  Service: Orthopedics;  Laterality: Right;   TOTAL KNEE ARTHROPLASTY Left 12/07/2017   TOTAL KNEE ARTHROPLASTY Left 12/07/2017   Procedure: LEFT TOTAL KNEE ARTHROPLASTY;  Surgeon: Newt Minion, MD;  Location: [place];  Service: Orthopedics;  Laterality: Left;   TRACHEOSTOMY TUBE PLACEMENT N/A 09/20/2017   Procedure: AWAKE INTUBATION WITH ANESTHESIA WITH VIDEO ASSISTANCE;  Surgeon: Helayne Seminole, MD;  Location: [place] [facility];  Service: ENT;  Laterality: N/A;   TRANSTHORACIC ECHOCARDIOGRAM  08/2014   Normal LV size and function.  Mild  LVH.  EF 55-60%.  Normal regional wall motion.  GR 1 DD.  Normal RV size and function .   TRANSTHORACIC ECHOCARDIOGRAM  08/12/2020    [facility]): Normal LV size and function.  EF 55 to 60%.  No RWM A.  Normal filling pattern.  Normal RV.  No significant valvular disease-stenosis or regurgitation.  No vegetation.   TUBAL LIGATION     with reversal in [place]      There were no vitals filed for this visit.    Subjective Assessment - 06/01/21 1108     Subjective Pt arriving to therapy reporting 3 week history of left sided neck pain. Pt stating she originally thought it maybe onset of ear infection , but after going to ER it was ruled out. Pt stating pain radiating into back of head.    Pertinent History closed fx left femur, CHF, COPD, anxiety, depression, DM, GERD, Gout, heperlipidemia, HTN, Osteoporosis, obesity, hallus fusion on left, left hammer toe, abdominal hysterectomy, left arm fx due to fall    Currently in Pain? Yes    Pain Score 5     Pain Location Neck    Pain Orientation Left    Pain  Descriptors / Indicators Aching;Sore    Pain Type Acute pain    Pain Onset 1 to 4 weeks ago    Pain Frequency Intermittent    Aggravating Factors  sometimes sleeping certain positions and it wakes pt up at night    Pain Relieving Factors muscle relaxor pills prescribed    Effect of Pain on Daily Activities difficulty sleeping, difficulty reaching behind me                [facility] PT Assessment - 06/01/21 0001       Assessment   Medical Diagnosis m54.2 neck pain    Referring Provider (PT) Benjiman Core, PA-C    Onset Date/Surgical Date --   3 week history   Hand Dominance Right    Prior Therapy yes for knees, and shoulder      Precautions   Precautions None      Restrictions   Weight Bearing Restrictions No      Balance Screen   Has the patient fallen in the past 6 months No    Is the patient reluctant to leave their home because of a fear of falling?  No      Home Environment   Living Environment Private residence    Living Arrangements Spouse/significant other    Type of [place] to enter    Entrance Stairs-Number of Steps 2    Entrance Stairs-Rails Left      Prior Function   Level of Independence Independent    Vocation Retired    Leisure shopping, beach trips      Cognition   Overall Cognitive Status Within Functional Limits for tasks assessed      Observation/Other Assessments   Focus on Therapeutic Outcomes (FOTO)  46% (predicted 60%)      Posture/Postural Control   Posture/Postural Control Postural limitations    Postural Limitations Rounded Shoulders;Forward head    Posture Comments lateral head tilt to left with increased left sholder hiking at rest      ROM / Strength   AROM / PROM / Strength AROM;Strength;PROM      AROM   Overall AROM  Deficits  Overall AROM Comments measurements in supine for shoulder, cervical measurement taken in sitting    AROM Assessment Site Cervical;Shoulder    Right/Left Shoulder Right;Left     Right Shoulder Extension 48 Degrees    Right Shoulder Flexion 150 Degrees   painful arc   Right Shoulder ABduction 130 Degrees   painful arc   Right Shoulder Internal Rotation 70 Degrees    Right Shoulder External Rotation 65 Degrees    Left Shoulder Extension 40 Degrees    Left Shoulder Flexion 150 Degrees   painful arc   Left Shoulder ABduction 135 Degrees   painful arc   Left Shoulder Internal Rotation 60 Degrees    Left Shoulder External Rotation 65 Degrees    Cervical Flexion 15    Cervical Extension 18    Cervical - Right Side Bend 10    Cervical - Left Side Bend 18    Cervical - Right Rotation 48    Cervical - Left Rotation 42      PROM   PROM Assessment Site Shoulder    Right/Left Shoulder Right;Left    Right Shoulder Extension 50 Degrees    Right Shoulder Flexion 155 Degrees    Right Shoulder ABduction 136 Degrees    Right Shoulder Internal Rotation 72 Degrees    Right Shoulder External Rotation 68 Degrees    Left Shoulder Extension 44 Degrees    Left Shoulder Flexion 152 Degrees    Left Shoulder ABduction 138 Degrees    Left Shoulder Internal Rotation 64 Degrees    Left Shoulder External Rotation 68 Degrees      Strength   Strength Assessment Site Shoulder    Right/Left Shoulder Right;Left    Right Shoulder Flexion 4/5    Right Shoulder Extension 4/5    Right Shoulder ABduction 4/5    Right Shoulder Internal Rotation 4/5    Right Shoulder External Rotation 4/5    Left Shoulder Flexion 4/5    Left Shoulder Extension 4/5    Left Shoulder ABduction 4/5    Left Shoulder Internal Rotation 4/5    Left Shoulder External Rotation 4/5      Palpation   Palpation comment TTP bilateral upper traps and left cervical paraspinals, and left middle trap with active trigger points noted throughout.      Special Tests   Other special tests negative empty can on right and left      Transfers   Five time sit to stand comments  [facility]      Ambulation/Gait   Gait Pattern  Step-through pattern                        Objective measurements completed on examination: See above findings.               PT Education - 06/01/21 1114     Education Details PT POC, HEP    Person(s) Educated Patient    Methods Explanation;Demonstration    Comprehension Verbalized understanding;Returned demonstration              PT Short Term Goals - 06/01/21 1302       PT SHORT TERM GOAL #1   Title Pt to be independent with initial HEP    Time 3    Period Weeks    Status New      PT SHORT TERM GOAL #2   Title Pt will report pain </= 5/10 with reaching overhead for donning shirts.  Status Partially Met               PT Long Term Goals - 06/01/21 1236       PT LONG TERM GOAL #1   Title Pt to be independent with advanced HEP.    Time 6    Period Weeks    Status New    Target Date 07/17/21      PT LONG TERM GOAL #2   Title Pt will improve her FOTO from 46% to >/= 60 %.    Baseline 46% on 06/01/2021    Time 6    Period Weeks    Status New    Target Date 07/17/21      PT LONG TERM GOAL #3   Title Pt will improve her bilateral cervical rotation to >/= 60 degrees with pain</= 2/10.    Time 6    Period Weeks    Status New    Target Date 07/17/21      PT LONG TERM GOAL #4   Title Pt will be able to imporve bilateral shoulder flexion to >/= 160 degrees with pain </= 2/10.    Time 6    Period Weeks    Status New    Target Date 07/17/21      PT LONG TERM GOAL #5   Title Pt will be able to perform lifting 10# object onto overhead shelf using bilateral UE"s using correct body mechanics with pain </= 2/10.    Time 6    Period Weeks    Status New                    Plan - 06/01/21 1256     Clinical Impression Statement Pt presenting today with 3 week history of neck pain and pain in bilateral shoulders. Pt with limitations in cervical ROM and shoulder ROM and mild weakness noted in bilateral shoulders. Pt was  instructed in HEP and was able to demonstrated compliance. We also discussed possible DN at upcoming visits to upper traps.  Skilled PT needed to address pt's impairments with below interventions.    Personal Factors and Comorbidities Comorbidity 3+    Comorbidities closed fx left femur, CHF, COPD, anxiety, depression, DM, GERD, Gout, heperlipidemia, HTN, Osteoporosis, obesity, hallus fusion on left, left hammer toe, abdominal hysterectomy    Examination-Activity Limitations Dressing;Lift;Carry;Other;Sleep    Examination-Participation Restrictions Cleaning;Community Activity;Other    Stability/Clinical Decision Making Stable/Uncomplicated    Clinical Decision Making Low    Rehab Potential Fair    PT Frequency 2x / week    PT Duration 12 weeks    PT Treatment/Interventions ADLs/Self Care Home Management;Cryotherapy;Electrical Stimulation;Iontophoresis 4mg /ml Dexamethasone;Moist Heat;Traction;Ultrasound;Therapeutic exercise;Therapeutic activities;Functional mobility training;Neuromuscular re-education;Patient/family education;Passive range of motion;Dry needling;Taping;Manual techniques    PT Next Visit Plan questionable DN for upper traps, cervical stretching, shoulder ROM and strengthening    PT Home Exercise Plan Access Code: [email or web address]    Consulted and Agree with Plan of Care Patient             Patient will benefit from skilled therapeutic intervention in order to improve the following deficits and impairments:  Pain, Postural dysfunction, Decreased strength, Decreased range of motion, Impaired UE functional use, Decreased activity tolerance, Impaired flexibility  Visit Diagnosis: Cervicalgia  Muscle weakness (generalized)  Chronic right shoulder pain  Acute pain of left shoulder  Abnormal posture     Problem List Patient Active Problem List   Diagnosis Date Noted   Class II obesity  02/02/2021   Iron deficiency anemia 10/01/2020   Postural dizziness with presyncope  09/30/2020   Hypertriglyceridemia 04/02/2020   Aortic atherosclerosis ([place]) 02/14/2020   Nontraumatic complete tear of left rotator cuff    Diabetic neuropathy ([place]) 02/20/2019   Severe persistent asthma 01/04/2019   Subcortical microvascular ischemic occlusive disease 07/12/2018   Rapid palpitations 07/12/2018   Impingement of left ankle joint 06/26/2018   Constipation 04/21/2018   Total knee replacement status, left 12/07/2017   Dysphagia 09/20/2017   Epiglottitis    Plantar fasciitis, right 07/13/2017   S/P total knee arthroplasty, right 06/29/2017   Osteoarthrosis, localized, primary, knee, right    Sprain of calcaneofibular ligament of right ankle 06/06/2017   Osteoarthritis of right knee 01/26/2017   Onychomycosis 10/27/2015   CKD (chronic kidney disease) stage 3, GFR 30-59 ml/min (HCC) 04/27/2015   MRSA (methicillin resistant staph aureus) culture positive 03/27/2015   History of osteomyelitis 03/27/2015   Fatty liver 09/30/2014   Hot flashes 07/15/2014   Abnormal SPEP 04/17/2014   Fracture of left leg 04/17/2014   Cushingoid side effect of steroids ([place]) 04/17/2014   Internal hemorrhoids    Pulmonary sarcoidosis (HCC)    Major depression in full remission ([place])    Preoperative clearance 03/25/2014   Obstructive chronic bronchitis without exacerbation ([place]) 09/18/2013   Chest pain 04/11/2013   Hypertensive heart disease with chronic diastolic congestive heart failure ([place])    Solitary pulmonary nodule, on CT 02/2013 - stable over 2 years in 2015 02/20/2013   Gout 08/20/2010   Deficiency anemia 09/18/2009   Morbid obesity ([place]) 06/04/2009   Sleep apnea 04/21/2009   Sarcoidosis of lung ([place]) 04/10/2007   Hyperlipidemia associated with type 2 diabetes mellitus ([place]) 08/21/2006   Hypertension associated with diabetes ([name]) 08/21/2006   GERD 08/21/2006   Type II diabetes mellitus with renal manifestations ([place]) 08/21/2006    Oretha Caprice, PT, MPT 06/01/2021, 1:04  PM  [facility] [address] [place], [name], [address] Phone: [phone]   Fax:  [phone]  Name: ANIS CINELLI MRN: [id number] Date of Birth: 02/12/1961

## 2021-06-11 ENCOUNTER — Ambulatory Visit: Payer: 59

## 2021-06-12 ENCOUNTER — Other Ambulatory Visit: Payer: Self-pay | Admitting: Family Medicine

## 2021-06-16 ENCOUNTER — Other Ambulatory Visit: Payer: Self-pay

## 2021-06-16 ENCOUNTER — Encounter: Payer: 59 | Admitting: Physical Therapy

## 2021-06-16 ENCOUNTER — Ambulatory Visit (INDEPENDENT_AMBULATORY_CARE_PROVIDER_SITE_OTHER): Payer: 59

## 2021-06-16 DIAGNOSIS — E538 Deficiency of other specified B group vitamins: Secondary | ICD-10-CM | POA: Diagnosis not present

## 2021-06-16 MED ORDER — CYANOCOBALAMIN 1000 MCG/ML IJ SOLN
1000.0000 ug | Freq: Once | INTRAMUSCULAR | Status: AC
Start: 2021-06-16 — End: 2021-06-16
  Administered 2021-06-16: 1000 ug via INTRAMUSCULAR

## 2021-06-16 NOTE — Progress Notes (Signed)
[name], 60 year old female presents in office today for b12 injection per Garret Reddish, MD. Administered CYANOCOBALAMIN 1000 mcg IM right deltoid. Patient tolerated injection well, is scheduled for next month.

## 2021-06-17 ENCOUNTER — Encounter: Payer: Self-pay | Admitting: Surgery

## 2021-06-17 ENCOUNTER — Ambulatory Visit (INDEPENDENT_AMBULATORY_CARE_PROVIDER_SITE_OTHER): Payer: 59 | Admitting: Surgery

## 2021-06-17 VITALS — BP 155/72 | HR 59 | Ht 72.0 in | Wt 228.6 lb

## 2021-06-17 DIAGNOSIS — M4722 Other spondylosis with radiculopathy, cervical region: Secondary | ICD-10-CM

## 2021-06-17 NOTE — Progress Notes (Signed)
Office Visit Note   Patient: [name]           Date of Birth: Oct 28, 1960           MRN: [id number] Visit Date: 06/17/2021              Requested by: [name], MD [place],  [place] [id number] PCP: [name], MD   Assessment & Plan: Visit Diagnoses:  1. Other spondylosis with radiculopathy, cervical region     Plan: With patient's ongoing symptoms I recommend getting cervical spine MRI to rule out HNP/stenosis.  Follow with Dr. Lorin Mercy after completion to discuss results and further treatment options.  On exam she does have a mildly positive impingement test on the right shoulder.  We will see if Dr. Lorin Mercy wants to consider doing a subacromial injection there depending on how she is feeling next visit.  Follow-Up Instructions: Return in about 3 weeks (around 07/08/2021) for with dr yates to review cervical mri scan.   Orders:  Orders Placed This Encounter  Procedures   MR Cervical Spine w/o contrast   No orders of the defined types were placed in this encounter.     Procedures: No procedures performed   Clinical Data: No additional findings.   Subjective: Chief Complaint  Patient presents with   Neck - Follow-up   Lab Results    HPI 60 year old white female returns.  States that symptoms unchanged from previous office visit with her neck pain and upper extremity radicular symptoms.  Primary care provider did not give oral prednisone or recommend taking any oral NSAIDs.  As previously documented in my note I did not feel comfortable recommending these medications to her.  Today patient describes having pain radiating to both shoulders.  More on the right side.  Some right shoulder pain with overhead activity but also described as being constant.   Objective: Vital Signs: BP (!) 155/72   Pulse (!) 59   Ht 6' (1.829 m)   Wt 228 lb 9.6 oz (103.7 kg)   BMI 31.00 kg/m   Physical Exam Musculoskeletal:     Comments: Positive  bilateral brachial plexus trapezius and scapular tenderness.  Right shoulder mildly positive impingement test.  Negative drop arm.  Good cuff strength.  Neurological:     Mental Status: She is alert.    Ortho Exam  Specialty Comments:  No specialty comments available.  Imaging: No results found.   PMFS History: Patient Active Problem List   Diagnosis Date Noted   Class II obesity 02/02/2021   Iron deficiency anemia 10/01/2020   Postural dizziness with presyncope 09/30/2020   Hypertriglyceridemia 04/02/2020   Aortic atherosclerosis ([place]) 02/14/2020   Nontraumatic complete tear of left rotator cuff    Diabetic neuropathy ([place]) 02/20/2019   Severe persistent asthma 01/04/2019   Subcortical microvascular ischemic occlusive disease 07/12/2018   Rapid palpitations 07/12/2018   Impingement of left ankle joint 06/26/2018   Constipation 04/21/2018   Total knee replacement status, left 12/07/2017   Dysphagia 09/20/2017   Epiglottitis    Plantar fasciitis, right 07/13/2017   S/P total knee arthroplasty, right 06/29/2017   Osteoarthrosis, localized, primary, knee, right    Sprain of calcaneofibular ligament of right ankle 06/06/2017   Osteoarthritis of right knee 01/26/2017   Onychomycosis 10/27/2015   CKD (chronic kidney disease) stage 3, GFR 30-59 ml/min (HCC) 04/27/2015   MRSA (methicillin resistant staph aureus) culture positive 03/27/2015   History of osteomyelitis 03/27/2015  Fatty liver 09/30/2014   Hot flashes 07/15/2014   Abnormal SPEP 04/17/2014   Fracture of left leg 04/17/2014   Cushingoid side effect of steroids ([place]) 04/17/2014   Internal hemorrhoids    Pulmonary sarcoidosis ([place])    Major depression in full remission ([place])    Preoperative clearance 03/25/2014   Obstructive chronic bronchitis without exacerbation ([place]) 09/18/2013   Chest pain 04/11/2013   Hypertensive heart disease with chronic diastolic congestive heart failure ([place])    Solitary pulmonary  nodule, on CT 02/2013 - stable over 2 years in 2015 02/20/2013   Gout 08/20/2010   Deficiency anemia 09/18/2009   Morbid obesity ([place]) 06/04/2009   Sleep apnea 04/21/2009   Sarcoidosis of lung ([place]) 04/10/2007   Hyperlipidemia associated with type 2 diabetes mellitus ([place]) 08/21/2006   Hypertension associated with diabetes ([place]) 08/21/2006   GERD 08/21/2006   Type II diabetes mellitus with renal manifestations ([place]) 08/21/2006   Past Medical History:  Diagnosis Date   Abnormal SPEP 04/17/2014   Acute left ankle pain 01/26/2017   ANEMIA-UNSPECIFIED 09/18/2009   Anxiety    Arthritis    Blood transfusion without reported diagnosis    Cataract    CHF (congestive heart failure) ([place])    Chronic diastolic heart failure, NYHA class 2 (HCC)    Normal LVEDP by May 2018   COPD (chronic obstructive pulmonary disease) ([place])    Depression    DIABETES MELLITUS, TYPE II 08/21/2006   Diabetic osteomyelitis ([place]) 05/29/2015   Fatty liver    Fracture of 5th metatarsal    non union   GERD [id number]   Had fundoplication   GOUT [id number]   Hammer toe of right foot    2-5th toes   Hx of umbilical hernia repair    HYPERLIPIDEMIA 08/21/2006   HYPERTENSION 08/21/2006   Infection of wound due to methicillin resistant Staphylococcus aureus (MRSA)    Internal hemorrhoids    Kidney problem    Multiple allergies 10/14/2016   OBESITY 06/04/2009   Onychomycosis 10/27/2015   Osteomyelitis of left foot ([place]) 05/29/2015   Osteoporosis    Pulmonary sarcoidosis ([place])    Followed locally by pulmonology, but also by Dr. Casper Harrison at [facility]   Right knee pain 01/26/2017   Vocal cord dysfunction    Wears partial dentures     Family History  Problem Relation Age of Onset   Diabetes Father    Heart attack Father 33   Coronary artery disease Father    Heart failure Father    Throat cancer Father    Hypertension Father    Heart disease Father    Sleep apnea Father    Obesity Father    COPD  Mother    Emphysema Mother    Asthma Mother    Heart failure Mother    Breast cancer Mother    Diabetes Mother    Kidney disease Mother    Thyroid disease Mother    Heart attack Maternal Grandfather    Sarcoidosis Maternal Uncle    Lung cancer Brother    Diabetes Brother    Colon cancer Neg Hx    Colon polyps Neg Hx    Esophageal cancer Neg Hx    Rectal cancer Neg Hx    Stomach cancer Neg Hx     Past Surgical History:  Procedure Laterality Date   ABDOMINAL HYSTERECTOMY     APPENDECTOMY     BLADDER SUSPENSION  11/11/2011   Procedure: TRANSVAGINAL TAPE (TVT) PROCEDURE;  Surgeon: Olga Millers, MD;  Location: [facility];  Service: Gynecology;  Laterality: N/A;   CAROTIDS  02/18/11   CAROTID DUPLEX; VERTEBRALS ARE PATENT WITH ANTEGRADE FLOW. ICA/CCA RATIO 1.61 ON RIGHT AND 0.75 ON LEFT   CATARACT EXTRACTION, BILATERAL     CHOLECYSTECTOMY  1984   COLONOSCOPY  04/29/2010   [name]   CYSTOSCOPY  11/11/2011   Procedure: CYSTOSCOPY;  Surgeon: Olga Millers, MD;  Location: [facility];  Service: Gynecology;  Laterality: N/A;   EXTUBATION (ENDOTRACHEAL) IN OR N/A 09/23/2017   Procedure: EXTUBATION (ENDOTRACHEAL) IN OR;  Surgeon: Helayne Seminole, MD;  Location: [facility];  Service: ENT;  Laterality: N/A;   FIBEROPTIC LARYNGOSCOPY AND TRACHEOSCOPY N/A 09/23/2017   Procedure: FLEXIBLE FIBEROPTIC LARYNGOSCOPY;  Surgeon: Helayne Seminole, MD;  Location: [facility];  Service: ENT;  Laterality: N/A;   FRACTURE SURGERY     foot   [place] Left 10/02/2018   Procedure: HALLUX ARTHRODESIS;  Surgeon: Edrick Kins, DPM;  Location: [facility];  Service: Podiatry;  Laterality: Left;   HAMMER TOE SURGERY Left 10/02/2018   Procedure: HAMMER TOE CORRECTION 2ND 3RD 4RD FIFTH TOE;  Surgeon: Edrick Kins, DPM;  Location: [facility];  Service: Podiatry;  Laterality: Left;   HAMMER TOE SURGERY Right 04/12/2019   Procedure: HAMMER TOE CORRECTION, 2ND, 3RD, 4TH AND 5TH TOES OF RIGHT FOOT;  Surgeon: Edrick Kins, DPM;   Location: [facility];  Service: Podiatry;  Laterality: Right;   HAMMER TOE SURGERY Left 10/25/2019   Procedure: HAMMER TOE REPAIR SECOND, THIRD, FOUTH;  Surgeon: Edrick Kins, DPM;  Location: [facility];  Service: Podiatry;  Laterality: Left;   HERNIA REPAIR     I & D EXTREMITY Left 06/27/2015   Procedure: Partial Excision Left Calcaneus, Place Antibiotic Beads, and Wound VAC;  Surgeon: Newt Minion, MD;  Location: [facility];  Service: Orthopedics;  Laterality: Left;   JOINT REPLACEMENT     KNEE ARTHROSCOPY     right   LEFT AND RIGHT HEART CATHETERIZATION WITH CORONARY ANGIOGRAM N/A 04/23/2013   Procedure: LEFT AND RIGHT HEART CATHETERIZATION WITH CORONARY ANGIOGRAM;  Surgeon: Leonie Man, MD;  Location: [facility] CATH LAB;  Service: Cardiovascular;  Laterality: N/A;   LEFT HEART CATH AND CORONARY ANGIOGRAPHY N/A 03/11/2017   Procedure: Left Heart Cath and Coronary Angiography;  Surgeon: Sherren Mocha, MD;  Location: [facility]; angiographically minimal CAD in the LAD otherwise normal.;  Normal LVEDP.  FALSE POSITIVE MYOVIEW   LEFT HEART CATH AND CORONARY ANGIOGRAPHY  07/20/2010   LVEF 50-55% WITH VERY MILD GLOBAL HYPOKINESIA; ESSENTIALLY NORMAL CORONARY ARTERIES; NORMAL LV FUNCTION   METATARSAL OSTEOTOMY WITH OPEN REDUCTION INTERNAL FIXATION (ORIF) METATARSAL WITH FUSION Left 04/09/2014   Procedure: LEFT FOOT FRACTURE OPEN TREATMENT METATARSAL INCLUDES INTERNAL FIXATION EACH;  Surgeon: Lorn Junes, MD;  Location: [facility];  Service: Orthopedics;  Laterality: Left;   NISSEN FUNDOPLICATION  [id number]   NM MYOVIEW LTD --> FALSE POSITIVE  03/10/2017   : Moderate size "stress-induced "perfusion defect at the apex as well as "ill-defined stress-induced perfusion defect in the lateral wall.  EF 55%.  INTERMEDIATE risk. -->  FALSE POSITIVE   ORIF DISTAL FEMUR FRACTURE  08/08/2020   [facility] [place]: ORIF of left extra-articular periprosthetic distal femur fracture with synthesis of the  femur plate with cortical nonlocking screws.  (Thought to be pathologic fracture due to osteoporosis) -> after a fall   ORIF FEMUR W/  PERI-IMPLANT  08/29/2020   [facility]) revision of left femur fracture ORIF plate fixation screws; culture positive for MSSA   Right and left CARDIAC CATHETERIZATION  04/23/2013   Angiographic normal coronaries; LVEDP 20 mmHg, PCWP 12-14 mmHg, RAP 12 mmHg.; Fick CO/CI 4.9/2.2   SHOULDER ARTHROSCOPY Left 03/14/2019   Procedure: LEFT SHOULDER ARTHROSCOPY, DEBRIDEMENT, AND DECOMPRESSION;  Surgeon: Newt Minion, MD;  Location: [facility];  Service: Orthopedics;  Laterality: Left;   TOTAL KNEE ARTHROPLASTY Right 06/29/2017   Procedure: RIGHT TOTAL KNEE ARTHROPLASTY;  Surgeon: Newt Minion, MD;  Location: [facility];  Service: Orthopedics;  Laterality: Right;   TOTAL KNEE ARTHROPLASTY Left 12/07/2017   TOTAL KNEE ARTHROPLASTY Left 12/07/2017   Procedure: LEFT TOTAL KNEE ARTHROPLASTY;  Surgeon: Newt Minion, MD;  Location: [facility];  Service: Orthopedics;  Laterality: Left;   TRACHEOSTOMY TUBE PLACEMENT N/A 09/20/2017   Procedure: AWAKE INTUBATION WITH ANESTHESIA WITH VIDEO ASSISTANCE;  Surgeon: Helayne Seminole, MD;  Location: [facility];  Service: ENT;  Laterality: N/A;   TRANSTHORACIC ECHOCARDIOGRAM  08/2014   Normal LV size and function.  Mild LVH.  EF 55-60%.  Normal regional wall motion.  GR 1 DD.  Normal RV size and function .   TRANSTHORACIC ECHOCARDIOGRAM  08/12/2020    [facility]): Normal LV size and function.  EF 55 to 60%.  No RWM A.  Normal filling pattern.  Normal RV.  No significant valvular disease-stenosis or regurgitation.  No vegetation.   TUBAL LIGATION     with reversal in 1994   UPPER GASTROINTESTINAL ENDOSCOPY     VENTRAL HERNIA REPAIR     Social History   Occupational History   Occupation: DISABLED  Tobacco Use   Smoking status: Never   Smokeless tobacco: Never  Vaping Use   Vaping Use: Never used  Substance and Sexual  Activity   Alcohol use: No    Alcohol/week: 0.0 standard drinks   Drug use: No   Sexual activity: Yes    Birth control/protection: Surgical

## 2021-06-20 ENCOUNTER — Other Ambulatory Visit: Payer: Self-pay

## 2021-06-20 ENCOUNTER — Ambulatory Visit
Admission: RE | Admit: 2021-06-20 | Discharge: 2021-06-20 | Disposition: A | Discharge status: Home / Self Care | Payer: 59 | Source: Ambulatory Visit | Attending: Surgery | Admitting: Surgery

## 2021-06-20 DIAGNOSIS — M4722 Other spondylosis with radiculopathy, cervical region: Secondary | ICD-10-CM

## 2021-06-22 ENCOUNTER — Encounter: Payer: Self-pay | Admitting: Physical Therapy

## 2021-06-22 ENCOUNTER — Ambulatory Visit (INDEPENDENT_AMBULATORY_CARE_PROVIDER_SITE_OTHER): Payer: 59 | Admitting: Physical Therapy

## 2021-06-22 ENCOUNTER — Other Ambulatory Visit: Payer: Self-pay

## 2021-06-22 DIAGNOSIS — M25511 Pain in right shoulder: Secondary | ICD-10-CM | POA: Diagnosis not present

## 2021-06-22 DIAGNOSIS — M6281 Muscle weakness (generalized): Secondary | ICD-10-CM | POA: Diagnosis not present

## 2021-06-22 DIAGNOSIS — M542 Cervicalgia: Secondary | ICD-10-CM

## 2021-06-22 DIAGNOSIS — M25512 Pain in left shoulder: Secondary | ICD-10-CM

## 2021-06-22 DIAGNOSIS — G8929 Other chronic pain: Secondary | ICD-10-CM

## 2021-06-22 DIAGNOSIS — M25612 Stiffness of left shoulder, not elsewhere classified: Secondary | ICD-10-CM

## 2021-06-22 DIAGNOSIS — R293 Abnormal posture: Secondary | ICD-10-CM

## 2021-06-22 NOTE — Therapy (Signed)
[place], [name], [address] Phone: [phone]   Fax:  [phone]  Physical Therapy Treatment  Patient Details  Name: [name] MRN: [id number] Date of Birth: [birth year] Referring Provider (PT): Benjiman Core, [place]   Encounter Date: 06/22/2021   PT End of Session - 06/22/21 1056     Visit Number 2    Number of Visits 12    Progress Note Due on Visit 10    PT Start Time [id number]    PT Stop Time 1056    PT Time Calculation (min) 38 min    Activity Tolerance Patient tolerated treatment well    Behavior During Therapy [facility] for tasks assessed/performed             Past Medical History:  Diagnosis Date   Abnormal SPEP 04/17/2014   Acute left ankle pain 01/26/2017   ANEMIA-UNSPECIFIED 09/18/2009   Anxiety    Arthritis    Blood transfusion without reported diagnosis    Cataract    CHF (congestive heart failure) ([place])    Chronic diastolic heart failure, NYHA class 2 ([place])    Normal LVEDP by May 2018   COPD (chronic obstructive pulmonary disease) ([place])    Depression    DIABETES MELLITUS, TYPE II 08/21/2006   Diabetic osteomyelitis ([place]) 05/29/2015   Fatty liver    Fracture of 5th metatarsal    non union   GERD [id number]   Had fundoplication   GOUT [id number]   Hammer toe of right foot    2-5th toes   Hx of umbilical hernia repair    HYPERLIPIDEMIA 08/21/2006   HYPERTENSION 08/21/2006   Infection of wound due to methicillin resistant Staphylococcus aureus (MRSA)    Internal hemorrhoids    Kidney problem    Multiple allergies 10/14/2016   OBESITY 06/04/2009   Onychomycosis 10/27/2015   Osteomyelitis of left foot ([place]) 05/29/2015   Osteoporosis    Pulmonary sarcoidosis ([place])    Followed locally by pulmonology, but also by Dr. Casper Harrison at [facility]   Right knee pain 01/26/2017   Vocal cord dysfunction    Wears partial dentures     Past Surgical History:  Procedure Laterality Date   ABDOMINAL  HYSTERECTOMY     APPENDECTOMY     BLADDER SUSPENSION  11/11/2011   Procedure: TRANSVAGINAL TAPE (TVT) PROCEDURE;  Surgeon: Olga Millers, MD;  Location: [facility];  Service: Gynecology;  Laterality: N/A;   CAROTIDS  02/18/11   CAROTID DUPLEX; VERTEBRALS ARE PATENT WITH ANTEGRADE FLOW. ICA/CCA RATIO 1.61 ON RIGHT AND 0.75 ON LEFT   CATARACT EXTRACTION, BILATERAL     CHOLECYSTECTOMY  1984   COLONOSCOPY  04/29/2010   [name]   CYSTOSCOPY  11/11/2011   Procedure: CYSTOSCOPY;  Surgeon: Olga Millers, MD;  Location: [facility];  Service: Gynecology;  Laterality: N/A;   EXTUBATION (ENDOTRACHEAL) IN OR N/A 09/23/2017   Procedure: EXTUBATION (ENDOTRACHEAL) IN OR;  Surgeon: Helayne Seminole, MD;  Location: [place];  Service: ENT;  Laterality: N/A;   FIBEROPTIC LARYNGOSCOPY AND TRACHEOSCOPY N/A 09/23/2017   Procedure: FLEXIBLE FIBEROPTIC LARYNGOSCOPY;  Surgeon: Helayne Seminole, MD;  Location: [place];  Service: ENT;  Laterality: N/A;   FRACTURE SURGERY     foot   [place] Left 10/02/2018   Procedure: HALLUX ARTHRODESIS;  Surgeon: Edrick Kins, DPM;  Location: [facility];  Service: Podiatry;  Laterality: Left;   HAMMER TOE SURGERY Left 10/02/2018   Procedure: HAMMER TOE CORRECTION  2ND 3RD 4RD FIFTH TOE;  Surgeon: Edrick Kins, DPM;  Location: [facility];  Service: Podiatry;  Laterality: Left;   HAMMER TOE SURGERY Right 04/12/2019   Procedure: HAMMER TOE CORRECTION, 2ND, 3RD, 4TH AND 5TH TOES OF RIGHT FOOT;  Surgeon: Edrick Kins, DPM;  Location: [place];  Service: Podiatry;  Laterality: Right;   HAMMER TOE SURGERY Left 10/25/2019   Procedure: HAMMER TOE REPAIR SECOND, THIRD, FOUTH;  Surgeon: Edrick Kins, DPM;  Location: [facility];  Service: Podiatry;  Laterality: Left;   HERNIA REPAIR     I & D EXTREMITY Left 06/27/2015   Procedure: Partial Excision Left Calcaneus, Place Antibiotic Beads, and Wound VAC;  Surgeon: Newt Minion, MD;  Location: [place];  Service: Orthopedics;  Laterality: Left;   JOINT  REPLACEMENT     KNEE ARTHROSCOPY     right   LEFT AND RIGHT HEART CATHETERIZATION WITH CORONARY ANGIOGRAM N/A 04/23/2013   Procedure: LEFT AND RIGHT HEART CATHETERIZATION WITH CORONARY ANGIOGRAM;  Surgeon: Leonie Man, MD;  Location: [facility] CATH [facility];  Service: Cardiovascular;  Laterality: N/A;   LEFT HEART CATH AND CORONARY ANGIOGRAPHY N/A 03/11/2017   Procedure: Left Heart Cath and Coronary Angiography;  Surgeon: Sherren Mocha, MD;  Location: [place] CV LAB; angiographically minimal CAD in the LAD otherwise normal.;  Normal LVEDP.  FALSE POSITIVE MYOVIEW   LEFT HEART CATH AND CORONARY ANGIOGRAPHY  07/20/2010   LVEF 50-55% WITH VERY MILD GLOBAL HYPOKINESIA; ESSENTIALLY NORMAL CORONARY ARTERIES; NORMAL LV FUNCTION   METATARSAL OSTEOTOMY WITH OPEN REDUCTION INTERNAL FIXATION (ORIF) METATARSAL WITH FUSION Left 04/09/2014   Procedure: LEFT FOOT FRACTURE OPEN TREATMENT METATARSAL INCLUDES INTERNAL FIXATION EACH;  Surgeon: Lorn Junes, MD;  Location: [facility];  Service: Orthopedics;  Laterality: Left;   NISSEN FUNDOPLICATION  8119   NM MYOVIEW LTD --> FALSE POSITIVE  03/10/2017   : Moderate size "stress-induced "perfusion defect at the apex as well as "ill-defined stress-induced perfusion defect in the lateral wall.  EF 55%.  INTERMEDIATE risk. -->  FALSE POSITIVE   ORIF DISTAL FEMUR FRACTURE  08/08/2020   [facility] [place]: ORIF of left extra-articular periprosthetic distal femur fracture with synthesis of the femur plate with cortical nonlocking screws.  (Thought to be pathologic fracture due to osteoporosis) -> after a fall   ORIF FEMUR W/ PERI-IMPLANT  08/29/2020   [facility]) revision of left femur fracture ORIF plate fixation screws; culture positive for MSSA   Right and left CARDIAC CATHETERIZATION  04/23/2013   Angiographic normal coronaries; LVEDP 20 mmHg, PCWP 12-14 mmHg, RAP 12 mmHg.; Fick CO/CI 4.9/2.2   SHOULDER ARTHROSCOPY Left 03/14/2019    Procedure: LEFT SHOULDER ARTHROSCOPY, DEBRIDEMENT, AND DECOMPRESSION;  Surgeon: Newt Minion, MD;  Location[facility];  Service: Orthopedics;  Laterality: Left;   TOTAL KNEE ARTHROPLASTY Right 06/29/2017   Procedure: RIGHT TOTAL KNEE ARTHROPLASTY;  Surgeon: Newt Minion, MD;  Location: [place];  Service: Orthopedics;  Laterality: Right;   TOTAL KNEE ARTHROPLASTY Left 12/07/2017   TOTAL KNEE ARTHROPLASTY Left 12/07/2017   Procedure: LEFT TOTAL KNEE ARTHROPLASTY;  Surgeon: Newt Minion, MD;  Location: [place];  Service: Orthopedics;  Laterality: Left;   TRACHEOSTOMY TUBE PLACEMENT N/A 09/20/2017   Procedure: AWAKE INTUBATION WITH ANESTHESIA WITH VIDEO ASSISTANCE;  Surgeon: Helayne Seminole, MD;  Location: [facility];  Service: ENT;  Laterality: N/A;   TRANSTHORACIC ECHOCARDIOGRAM  08/2014   Normal LV size and function.  Mild  LVH.  EF 55-60%.  Normal regional wall motion.  GR 1 DD.  Normal RV size and function .   TRANSTHORACIC ECHOCARDIOGRAM  08/12/2020    [facility]): Normal LV size and function.  EF 55 to 60%.  No RWM A.  Normal filling pattern.  Normal RV.  No significant valvular disease-stenosis or regurgitation.  No vegetation.   TUBAL LIGATION     with reversal in [place]      There were no vitals filed for this visit.   Subjective Assessment - 06/22/21 1023     Subjective Pt arriving today reporting pain of 6/10 in her neck and pain radiating down bilateral fingers which can increase to 10/10.    Pertinent History closed fx left femur, CHF, COPD, anxiety, depression, DM, GERD, Gout, heperlipidemia, HTN, Osteoporosis, obesity, hallus fusion on left, left hammer toe, abdominal hysterectomy, left arm fx due to fall    Currently in Pain? Yes    Pain Score 6     Pain Location Neck    Pain Orientation Lower;Left    Pain Descriptors / Indicators Aching;Sore;Tightness    Pain Radiating Towards down both arms into finger tips     Pain Onset More than a month ago                               [facility] Adult PT Treatment/Exercise - 06/22/21 0001       Exercises   Exercises Neck      Neck Exercises: Machines for Strengthening   Other Machines for Strengthening pulley x 3 minutes flexion      Neck Exercises: Theraband   Scapula Retraction 10 reps    Rows 10 reps    Rows Limitations green      Neck Exercises: Standing   Other Standing Exercises row with green theraband x 15, shoulder extension x 15 green theraband   3 second holds with each rep     Neck Exercises: Supine   Cervical Isometrics Flexion;Extension;Right rotation;Left rotation;5 secs    Neck Retraction 5 secs;10 reps      Neck Exercises: Stretches   Upper Trapezius Stretch Right;Left;2 reps;10 seconds    Upper Trapezius Stretch Limitations with added STM mobs to elongate muscle fibers    Levator Stretch 2 reps;10 seconds    Other Neck Stretches Door stretch arms at 90 degrees x 3 holding 20 seconds      Manual Therapy   Manual therapy comments PROM                       PT Short Term Goals - 06/22/21 1057       PT SHORT TERM GOAL #1   Title Pt to be independent with initial HEP    Status On-going      PT SHORT TERM GOAL #2   Title Pt will report pain </= 5/10 with reaching overhead for donning shirts.    Status On-going               PT Long Term Goals - 06/01/21 1236       PT LONG TERM GOAL #1   Title Pt to be independent with advanced HEP.    Time 6    Period Weeks    Status New    Target Date 07/17/21      PT  LONG TERM GOAL #2   Title Pt will improve her FOTO from 46% to >/= 60 %.    Baseline 46% on 06/01/2021    Time 6    Period Weeks    Status New    Target Date 07/17/21      PT LONG TERM GOAL #3   Title Pt will improve her bilateral cervical rotation to >/= 60 degrees with pain</= 2/10.    Time 6    Period Weeks    Status New    Target Date 07/17/21      PT LONG TERM  GOAL #4   Title Pt will be able to imporve bilateral shoulder flexion to >/= 160 degrees with pain </= 2/10.    Time 6    Period Weeks    Status New    Target Date 07/17/21      PT LONG TERM GOAL #5   Title Pt will be able to perform lifting 10# object onto overhead shelf using bilateral UE"s using correct body mechanics with pain </= 2/10.    Time 6    Period Weeks    Status New                   Plan - 06/22/21 1058     Clinical Impression Statement Pt tolerating exercises today. Pt's HEP reviewed. Pt still reproting numbness /tingling down bilateral arms into her finger tips. Pt's MRI revealed dominant degenerative finding is facet hypertrophy, up to moderate in the upper cervical spine and associated with mild to moderate right C3 neural foraminal stenosis.   Pt with good resposne to occipital release and cercial PROM an mobs reporting less pain at end of session with numbness and tingling still present but improved. Continue skilled PT to maximzie funciton. Consider traction next visit.    Personal Factors and Comorbidities Comorbidity 3+    Comorbidities closed fx left femur, CHF, COPD, anxiety, depression, DM, GERD, Gout, heperlipidemia, HTN, Osteoporosis, obesity, hallus fusion on left, left hammer toe, abdominal hysterectomy    Examination-Activity Limitations Dressing;Lift;Carry;Other;Sleep    Examination-Participation Restrictions Cleaning;Community Activity;Other    Stability/Clinical Decision Making Stable/Uncomplicated    Rehab Potential Fair    PT Frequency 2x / week    PT Duration 12 weeks    PT Treatment/Interventions ADLs/Self Care Home Management;Cryotherapy;Electrical Stimulation;Iontophoresis '4mg'$ /ml Dexamethasone;Moist Heat;Traction;Ultrasound;Therapeutic exercise;Therapeutic activities;Functional mobility training;Neuromuscular re-education;Patient/family education;Passive range of motion;Dry needling;Taping;Manual techniques    PT Next Visit Plan consider  traction, DN, cervical stretching, shoulder ROM/strengthening    PT Home Exercise Plan Access Code: [email or web address]    Consulted and Agree with Plan of Care Patient             Patient will benefit from skilled therapeutic intervention in order to improve the following deficits and impairments:  Pain, Postural dysfunction, Decreased strength, Decreased range of motion, Impaired UE functional use, Decreased activity tolerance, Impaired flexibility  Visit Diagnosis: Cervicalgia  Muscle weakness (generalized)  Chronic right shoulder pain  Acute pain of left shoulder  Abnormal posture  Stiffness of left shoulder joint     Problem List Patient Active Problem List   Diagnosis Date Noted   Class II obesity 02/02/2021   Iron deficiency anemia 10/01/2020   Postural dizziness with presyncope 09/30/2020   Hypertriglyceridemia 04/02/2020   Aortic atherosclerosis ([place]) 02/14/2020   Nontraumatic complete tear of left rotator cuff    Diabetic neuropathy ([place]) 02/20/2019   Severe persistent asthma 01/04/2019   Subcortical microvascular ischemic occlusive disease 07/12/2018  Rapid palpitations 07/12/2018   Impingement of left ankle joint 06/26/2018   Constipation 04/21/2018   Total knee replacement status, left 12/07/2017   Dysphagia 09/20/2017   Epiglottitis    Plantar fasciitis, right 07/13/2017   S/P total knee arthroplasty, right 06/29/2017   Osteoarthrosis, localized, primary, knee, right    Sprain of calcaneofibular ligament of right ankle 06/06/2017   Osteoarthritis of right knee 01/26/2017   Onychomycosis 10/27/2015   CKD (chronic kidney disease) stage 3, GFR 30-59 ml/min (HCC) 04/27/2015   MRSA (methicillin resistant staph aureus) culture positive 03/27/2015   History of osteomyelitis 03/27/2015   Fatty liver 09/30/2014   Hot flashes 07/15/2014   Abnormal SPEP 04/17/2014   Fracture of left leg 04/17/2014   Cushingoid side effect of steroids ([place]) 04/17/2014   Internal  hemorrhoids    Pulmonary sarcoidosis (HCC)    Major depression in full remission ([place])    Preoperative clearance 03/25/2014   Obstructive chronic bronchitis without exacerbation ([place]) 09/18/2013   Chest pain 04/11/2013   Hypertensive heart disease with chronic diastolic congestive heart failure ([place])    Solitary pulmonary nodule, on CT 02/2013 - stable over 2 years in 2015 02/20/2013   Gout 08/20/2010   Deficiency anemia 09/18/2009   Morbid obesity ([place]) 06/04/2009   Sleep apnea 04/21/2009   Sarcoidosis of lung ([place]) 04/10/2007   Hyperlipidemia associated with type 2 diabetes mellitus ([place]) 08/21/2006   Hypertension associated with diabetes ([place]) 08/21/2006   GERD 08/21/2006   Type II diabetes mellitus with renal manifestations ([place]) 08/21/2006    Oretha Caprice, PT, MPT 06/22/2021, [facility] [address] [place], [name], [address] Phone: [phone]   Fax:  [phone]  Name: [name] MRN: [id number] Date of Birth: February 08, 1961

## 2021-06-24 ENCOUNTER — Ambulatory Visit (INDEPENDENT_AMBULATORY_CARE_PROVIDER_SITE_OTHER): Payer: 59 | Admitting: Orthopaedic Surgery

## 2021-06-24 ENCOUNTER — Ambulatory Visit: Payer: 59 | Admitting: Specialist

## 2021-06-24 ENCOUNTER — Encounter: Payer: Self-pay | Admitting: Orthopaedic Surgery

## 2021-06-24 VITALS — BP 150/77 | HR 84 | Ht 66.0 in | Wt 225.0 lb

## 2021-06-24 DIAGNOSIS — M1A40X0 Other secondary chronic gout, unspecified site, without tophus (tophi): Secondary | ICD-10-CM | POA: Diagnosis not present

## 2021-06-24 DIAGNOSIS — M4802 Spinal stenosis, cervical region: Secondary | ICD-10-CM

## 2021-06-24 DIAGNOSIS — M47812 Spondylosis without myelopathy or radiculopathy, cervical region: Secondary | ICD-10-CM | POA: Diagnosis not present

## 2021-06-24 MED ORDER — ALLOPURINOL 100 MG PO TABS: ORAL_TABLET | ORAL | 0 refills | Status: DC

## 2021-06-24 MED ORDER — MELOXICAM 15 MG PO TABS: 15.0000 mg | ORAL_TABLET | Freq: Every day | ORAL | 1 refills | Status: DC

## 2021-06-29 ENCOUNTER — Ambulatory Visit (INDEPENDENT_AMBULATORY_CARE_PROVIDER_SITE_OTHER): Payer: 59 | Admitting: Physical Therapy

## 2021-06-29 ENCOUNTER — Other Ambulatory Visit: Payer: Self-pay

## 2021-06-29 ENCOUNTER — Encounter: Payer: Self-pay | Admitting: Physical Therapy

## 2021-06-29 DIAGNOSIS — M542 Cervicalgia: Secondary | ICD-10-CM | POA: Diagnosis not present

## 2021-06-29 DIAGNOSIS — R293 Abnormal posture: Secondary | ICD-10-CM

## 2021-06-29 DIAGNOSIS — M25612 Stiffness of left shoulder, not elsewhere classified: Secondary | ICD-10-CM

## 2021-06-29 DIAGNOSIS — G8929 Other chronic pain: Secondary | ICD-10-CM

## 2021-06-29 DIAGNOSIS — M47812 Spondylosis without myelopathy or radiculopathy, cervical region: Secondary | ICD-10-CM | POA: Insufficient documentation

## 2021-06-29 DIAGNOSIS — M4802 Spinal stenosis, cervical region: Secondary | ICD-10-CM | POA: Insufficient documentation

## 2021-06-29 DIAGNOSIS — M25511 Pain in right shoulder: Secondary | ICD-10-CM

## 2021-06-29 DIAGNOSIS — M6281 Muscle weakness (generalized): Secondary | ICD-10-CM

## 2021-06-29 DIAGNOSIS — M25512 Pain in left shoulder: Secondary | ICD-10-CM

## 2021-06-29 NOTE — Therapy (Signed)
[place], [name], [address] Phone: [phone]   Fax:  [phone]  Physical Therapy Treatment  Patient Details  Name: [name] MRN: [id number] Date of Birth: Mar 07, 1961 Referring Provider (PT): Benjiman Core, [place]   Encounter Date: 06/29/2021   PT End of Session - 06/29/21 1025     Visit Number 3    Number of Visits 12    Progress Note Due on Visit 10    PT Start Time [id number]    PT Stop Time 1056    PT Time Calculation (min) 38 min    Activity Tolerance Patient tolerated treatment well    Behavior During Therapy [facility] for tasks assessed/performed             Past Medical History:  Diagnosis Date   Abnormal SPEP 04/17/2014   Acute left ankle pain 01/26/2017   ANEMIA-UNSPECIFIED 09/18/2009   Anxiety    Arthritis    Blood transfusion without reported diagnosis    Cataract    CHF (congestive heart failure) ([place])    Chronic diastolic heart failure, NYHA class 2 ([place])    Normal LVEDP by May 2018   COPD (chronic obstructive pulmonary disease) ([place])    Depression    DIABETES MELLITUS, TYPE II 08/21/2006   Diabetic osteomyelitis ([place]) 05/29/2015   Fatty liver    Fracture of 5th metatarsal    non union   GERD [id number]   Had fundoplication   GOUT [id number]   Hammer toe of right foot    2-5th toes   Hx of umbilical hernia repair    HYPERLIPIDEMIA 08/21/2006   HYPERTENSION 08/21/2006   Infection of wound due to methicillin resistant Staphylococcus aureus (MRSA)    Internal hemorrhoids    Kidney problem    Multiple allergies 10/14/2016   OBESITY 06/04/2009   Onychomycosis 10/27/2015   Osteomyelitis of left foot ([place]) 05/29/2015   Osteoporosis    Pulmonary sarcoidosis ([place])    Followed locally by pulmonology, but also by Dr. Casper Harrison at [facility]   Right knee pain 01/26/2017   Vocal cord dysfunction    Wears partial dentures     Past Surgical History:  Procedure Laterality Date   ABDOMINAL  HYSTERECTOMY     APPENDECTOMY     BLADDER SUSPENSION  11/11/2011   Procedure: TRANSVAGINAL TAPE (TVT) PROCEDURE;  Surgeon: Olga Millers, MD;  Location: [facility];  Service: Gynecology;  Laterality: N/A;   CAROTIDS  02/18/11   CAROTID DUPLEX; VERTEBRALS ARE PATENT WITH ANTEGRADE FLOW. ICA/CCA RATIO 1.61 ON RIGHT AND 0.75 ON LEFT   CATARACT EXTRACTION, BILATERAL     CHOLECYSTECTOMY  1984   COLONOSCOPY  04/29/2010   [name]   CYSTOSCOPY  11/11/2011   Procedure: CYSTOSCOPY;  Surgeon: Olga Millers, MD;  Location: [facility];  Service: Gynecology;  Laterality: N/A;   EXTUBATION (ENDOTRACHEAL) IN OR N/A 09/23/2017   Procedure: EXTUBATION (ENDOTRACHEAL) IN OR;  Surgeon: Helayne Seminole, MD;  Location: [place];  Service: ENT;  Laterality: N/A;   FIBEROPTIC LARYNGOSCOPY AND TRACHEOSCOPY N/A 09/23/2017   Procedure: FLEXIBLE FIBEROPTIC LARYNGOSCOPY;  Surgeon: Helayne Seminole, MD;  Location: [place];  Service: ENT;  Laterality: N/A;   FRACTURE SURGERY     foot   [place] Left 10/02/2018   Procedure: HALLUX ARTHRODESIS;  Surgeon: Edrick Kins, DPM;  Location: [facility];  Service: Podiatry;  Laterality: Left;   HAMMER TOE SURGERY Left 10/02/2018   Procedure: HAMMER TOE CORRECTION  2ND 3RD 4RD FIFTH TOE;  Surgeon: Edrick Kins, DPM;  Location: [facility];  Service: Podiatry;  Laterality: Left;   HAMMER TOE SURGERY Right 04/12/2019   Procedure: HAMMER TOE CORRECTION, 2ND, 3RD, 4TH AND 5TH TOES OF RIGHT FOOT;  Surgeon: Edrick Kins, DPM;  Location: [place];  Service: Podiatry;  Laterality: Right;   HAMMER TOE SURGERY Left 10/25/2019   Procedure: HAMMER TOE REPAIR SECOND, THIRD, FOUTH;  Surgeon: Edrick Kins, DPM;  Location: [facility];  Service: Podiatry;  Laterality: Left;   HERNIA REPAIR     I & D EXTREMITY Left 06/27/2015   Procedure: Partial Excision Left Calcaneus, Place Antibiotic Beads, and Wound VAC;  Surgeon: Newt Minion, MD;  Location: [place];  Service: Orthopedics;  Laterality: Left;   JOINT  REPLACEMENT     KNEE ARTHROSCOPY     right   LEFT AND RIGHT HEART CATHETERIZATION WITH CORONARY ANGIOGRAM N/A 04/23/2013   Procedure: LEFT AND RIGHT HEART CATHETERIZATION WITH CORONARY ANGIOGRAM;  Surgeon: Leonie Man, MD;  Location: [facility] CATH [facility];  Service: Cardiovascular;  Laterality: N/A;   LEFT HEART CATH AND CORONARY ANGIOGRAPHY N/A 03/11/2017   Procedure: Left Heart Cath and Coronary Angiography;  Surgeon: Sherren Mocha, MD;  Location: [place] CV LAB; angiographically minimal CAD in the LAD otherwise normal.;  Normal LVEDP.  FALSE POSITIVE MYOVIEW   LEFT HEART CATH AND CORONARY ANGIOGRAPHY  07/20/2010   LVEF 50-55% WITH VERY MILD GLOBAL HYPOKINESIA; ESSENTIALLY NORMAL CORONARY ARTERIES; NORMAL LV FUNCTION   METATARSAL OSTEOTOMY WITH OPEN REDUCTION INTERNAL FIXATION (ORIF) METATARSAL WITH FUSION Left 04/09/2014   Procedure: LEFT FOOT FRACTURE OPEN TREATMENT METATARSAL INCLUDES INTERNAL FIXATION EACH;  Surgeon: Lorn Junes, MD;  Location: [facility];  Service: Orthopedics;  Laterality: Left;   NISSEN FUNDOPLICATION  8119   NM MYOVIEW LTD --> FALSE POSITIVE  03/10/2017   : Moderate size "stress-induced "perfusion defect at the apex as well as "ill-defined stress-induced perfusion defect in the lateral wall.  EF 55%.  INTERMEDIATE risk. -->  FALSE POSITIVE   ORIF DISTAL FEMUR FRACTURE  08/08/2020   [facility]: ORIF of left extra-articular periprosthetic distal femur fracture with synthesis of the femur plate with cortical nonlocking screws.  (Thought to be pathologic fracture due to osteoporosis) -> after a fall   ORIF FEMUR W/ PERI-IMPLANT  08/29/2020   [facility]) revision of left femur fracture ORIF plate fixation screws; culture positive for MSSA   Right and left CARDIAC CATHETERIZATION  04/23/2013   Angiographic normal coronaries; LVEDP 20 mmHg, PCWP 12-14 mmHg, RAP 12 mmHg.; Fick CO/CI 4.9/2.2   SHOULDER ARTHROSCOPY Left 03/14/2019    Procedure: LEFT SHOULDER ARTHROSCOPY, DEBRIDEMENT, AND DECOMPRESSION;  Surgeon: Newt Minion, MD;  Location: [facility];  Service: Orthopedics;  Laterality: Left;   TOTAL KNEE ARTHROPLASTY Right 06/29/2017   Procedure: RIGHT TOTAL KNEE ARTHROPLASTY;  Surgeon: Newt Minion, MD;  Location: [place];  Service: Orthopedics;  Laterality: Right;   TOTAL KNEE ARTHROPLASTY Left 12/07/2017   TOTAL KNEE ARTHROPLASTY Left 12/07/2017   Procedure: LEFT TOTAL KNEE ARTHROPLASTY;  Surgeon: Newt Minion, MD;  Location: [place];  Service: Orthopedics;  Laterality: Left;   TRACHEOSTOMY TUBE PLACEMENT N/A 09/20/2017   Procedure: AWAKE INTUBATION WITH ANESTHESIA WITH VIDEO ASSISTANCE;  Surgeon: Helayne Seminole, MD;  Location: [facility];  Service: ENT;  Laterality: N/A;   TRANSTHORACIC ECHOCARDIOGRAM  08/2014   Normal LV size and function.  Mild  LVH.  EF 55-60%.  Normal regional wall motion.  GR 1 DD.  Normal RV size and function .   TRANSTHORACIC ECHOCARDIOGRAM  08/12/2020    [facility]): Normal LV size and function.  EF 55 to 60%.  No RWM A.  Normal filling pattern.  Normal RV.  No significant valvular disease-stenosis or regurgitation.  No vegetation.   TUBAL LIGATION     with reversal in [place]      There were no vitals filed for this visit.   Subjective Assessment - 06/29/21 1023     Subjective Pt arriving today reporting 6/10 pain in right shoulder. Pt stating she saw her doctor last week and he thinks there maybe some gout due to uric acid levels being elevated.    Pertinent History closed fx left femur, CHF, COPD, anxiety, depression, DM, GERD, Gout, heperlipidemia, HTN, Osteoporosis, obesity, hallus fusion on left, left hammer toe, abdominal hysterectomy, left arm fx due to fall    Currently in Pain? Yes    Pain Score 6     Pain Location Shoulder    Pain Orientation Right    Pain Type Acute pain    Pain Onset More than a month ago                                [facility] Adult PT Treatment/Exercise - 06/29/21 0001       Exercises   Exercises Neck      Neck Exercises: Machines for Strengthening   Other Machines for Strengthening pulley x 3 minutes flexion      Neck Exercises: Standing   Wall Wash x 10 with bilateral UE's holding 3 seconds at end range    Other Standing Exercises row with green theraband x 15, shoulder extension x 15 green theraband   3 second holds with each rep     Neck Exercises: Seated   Neck Retraction 10 reps;5 secs    Shoulder Rolls Limitations shoulder rolls to help with relaxation along with deep breathing exercise      Neck Exercises: Supine   Cervical Isometrics Flexion;Extension;Right rotation;Left rotation;5 secs    Neck Retraction 5 secs;10 reps      Neck Exercises: Stretches   Upper Trapezius Stretch Right;Left;2 reps;10 seconds    Upper Trapezius Stretch Limitations with added STM mobs to elongate muscle fibers    Levator Stretch 2 reps;10 seconds    Other Neck Stretches Door stretch arms at 90 degrees x 3 holding 20 seconds      Manual Therapy   Manual therapy comments cervical gentle mobs and manual cervical traction                       PT Short Term Goals - 06/29/21 1025       PT SHORT TERM GOAL #1   Title Pt to be independent with initial HEP    Status Achieved      PT SHORT TERM GOAL #2   Title Pt will report pain </= 5/10 with reaching overhead for donning shirts.    Baseline Pt stating pain of 4-5/10 when donning her shirt    Time 4    Period Weeks    Status Achieved    Target Date 07/03/21               PT  Long Term Goals - 06/29/21 1027       PT LONG TERM GOAL #1   Title Pt to be independent with advanced HEP.    Status On-going      PT LONG TERM GOAL #2   Title Pt will improve her FOTO from 46% to >/= 60 %.    Status On-going      PT LONG TERM GOAL #3   Title Pt will improve her bilateral cervical  rotation to >/= 60 degrees with pain</= 2/10.    Status On-going      PT LONG TERM GOAL #4   Title Pt will be able to imporve bilateral shoulder flexion to >/= 160 degrees with pain </= 2/10.    Status On-going      PT LONG TERM GOAL #5   Title Pt will be able to perform lifting 10# object onto overhead shelf using bilateral UE"s using correct body mechanics with pain </= 2/10.    Status On-going                   Plan - 06/29/21 1055     Clinical Impression Statement Pt tolerating exercises well for neck and shoulder. Pt still reporting numbness and tingling down her right arm finger tips. Pt requiring instructions for exercise techniques throughout session and HEP reviewed. Continue skilled PT to maximize function.    Comorbidities closed fx left femur, CHF, COPD, anxiety, depression, DM, GERD, Gout, heperlipidemia, HTN, Osteoporosis, obesity, hallus fusion on left, left hammer toe, abdominal hysterectomy    Examination-Activity Limitations Dressing;Lift;Carry;Other;Sleep    Examination-Participation Restrictions Cleaning;Community Activity;Other    Stability/Clinical Decision Making Stable/Uncomplicated    Rehab Potential Fair    PT Frequency 2x / week    PT Duration 12 weeks    PT Treatment/Interventions ADLs/Self Care Home Management;Cryotherapy;Electrical Stimulation;Iontophoresis '4mg'$ /ml Dexamethasone;Moist Heat;Traction;Ultrasound;Therapeutic exercise;Therapeutic activities;Functional mobility training;Neuromuscular re-education;Patient/family education;Passive range of motion;Dry needling;Taping;Manual techniques    PT Next Visit Plan consider mechanical  traction, DN, cervical stretching, shoulder ROM/strengthening    PT Home Exercise Plan Access Code: [email or web address]    Consulted and Agree with Plan of Care Patient             Patient will benefit from skilled therapeutic intervention in order to improve the following deficits and impairments:  Pain, Postural  dysfunction, Decreased strength, Decreased range of motion, Impaired UE functional use, Decreased activity tolerance, Impaired flexibility  Visit Diagnosis: Cervicalgia  Chronic right shoulder pain  Muscle weakness (generalized)  Acute pain of left shoulder  Abnormal posture  Stiffness of left shoulder joint     Problem List Patient Active Problem List   Diagnosis Date Noted   Facet arthropathy, cervical 06/29/2021   Foraminal stenosis of cervical region 06/29/2021   Class II obesity 02/02/2021   Iron deficiency anemia 10/01/2020   Postural dizziness with presyncope 09/30/2020   Hypertriglyceridemia 04/02/2020   Aortic atherosclerosis ([place]) 02/14/2020   Nontraumatic complete tear of left rotator cuff    Diabetic neuropathy (HCC) 02/20/2019   Severe persistent asthma 01/04/2019   Subcortical microvascular ischemic occlusive disease 07/12/2018   Rapid palpitations 07/12/2018   Impingement of left ankle joint 06/26/2018   Constipation 04/21/2018   Total knee replacement status, left 12/07/2017   Dysphagia 09/20/2017   Epiglottitis    Plantar fasciitis, right 07/13/2017   S/P total knee arthroplasty, right 06/29/2017   Osteoarthrosis, localized, primary, knee, right    Sprain of calcaneofibular ligament of right ankle 06/06/2017   Osteoarthritis of right  knee 01/26/2017   Onychomycosis 10/27/2015   CKD (chronic kidney disease) stage 3, GFR 30-59 ml/min (HCC) 04/27/2015   MRSA (methicillin resistant staph aureus) culture positive 03/27/2015   History of osteomyelitis 03/27/2015   Fatty liver 09/30/2014   Hot flashes 07/15/2014   Abnormal SPEP 04/17/2014   Fracture of left leg 04/17/2014   Cushingoid side effect of steroids ([place]) 04/17/2014   Internal hemorrhoids    Pulmonary sarcoidosis (HCC)    Major depression in full remission ([place])    Preoperative clearance 03/25/2014   Obstructive chronic bronchitis without exacerbation ([place]) 09/18/2013   Chest pain 04/11/2013    Hypertensive heart disease with chronic diastolic congestive heart failure ([place])    Solitary pulmonary nodule, on CT 02/2013 - stable over 2 years in 2015 02/20/2013   Gout 08/20/2010   Deficiency anemia 09/18/2009   Morbid obesity ([place]) 06/04/2009   Sleep apnea 04/21/2009   Sarcoidosis of lung ([place]) 04/10/2007   Hyperlipidemia associated with type 2 diabetes mellitus ([place]) 08/21/2006   Hypertension associated with diabetes ([place]) 08/21/2006   GERD 08/21/2006   Type II diabetes mellitus with renal manifestations ([place]) 08/21/2006    Oretha Caprice, PT, MPT 06/29/2021, 11:04 AM  [facility] [address] [place], [name], [address] Phone: [phone]   Fax:  [phone]  Name: [name] MRN: [id number] Date of Birth: Aug 12, 1961

## 2021-06-29 NOTE — Progress Notes (Signed)
Office Visit Note   Patient: [name]           Date of Birth: [birth year]           MRN: [id number] Visit Date: 06/24/2021              Requested by: [name], MD [place],   [id number] PCP: [name], MD   Assessment & Plan: Visit Diagnoses:  1. 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 Facet arthropathy, cervical   3. Foraminal stenosis of cervical region     Plan: Patient has cervical foraminal stenosis with facet hypertrophy moderate on the right at C2-3 with mild to moderate right C3 foraminal stenosis.  Mild facet hypertrophy greater on the left at C3-4 with mild C4 foraminal stenosis.  Recheck 1 month.  She has hyperuricemia with uric acid 7.6.  She used to be on allopurinol.  Some of her cervical arthropathy may be related to gout and we will start her on Mobic 15 mg 1 p.o. daily and start some allopurinol 100 mg p.o. daily x1 week after for 5 days and gradually increase it with slow ramp up to 300 mg.  Recheck 1 month.  Follow-Up Instructions: Return in about 1 month (around 07/24/2021).   Orders:  No orders of the defined types were placed in this encounter.  Meds ordered this encounter  Medications   meloxicam (MOBIC) 15 MG tablet    Sig: Take 1 tablet (15 mg total) by mouth daily.    Dispense:  30 tablet    Refill:  1   allopurinol (ZYLOPRIM) 100 MG tablet    Sig: Take one po daily for one week then take 2 tablets daily for 1 week then 3 tablets daily.    Dispense:  60 tablet    Refill:  0      Procedures: No procedures performed   Clinical Data: No additional findings.   Subjective: Chief Complaint  Patient presents with   Neck - Pain, Follow-up    MRI review    HPI 60 year old female returns post MRI cervical spine with persistent problems with neck pain that radiates into both arms.  She states she has had some intermittent numbness and tingling in her fingers.  She has  diabetes some problems with her kidney and cannot take corticosteroids.  She is used Robaxin and Tylenol arthritis without relief.  She has been through physical therapy.  MRI scan is available and reviewed today with patient.  Review of Systems all other systems noncontributory to HPI.   Objective: Vital Signs: BP (!) 150/77   Pulse 84   Ht 5\' 6"  (1.676 m)   Wt 225 lb (102.1 kg)   BMI 36.32 kg/m   Physical Exam Constitutional:      Appearance: She is well-developed.  HENT:     Head: Normocephalic.     Right Ear: External ear normal.     Left Ear: External ear normal. There is no impacted cerumen.  Eyes:     Pupils: Pupils are equal, round, and reactive to light.  Neck:     Thyroid: No thyromegaly.     Trachea: No tracheal deviation.  Cardiovascular:     Rate and Rhythm: Normal rate.  Pulmonary:     Effort: Pulmonary effort is normal.  Abdominal:     Palpations: Abdomen is soft.  Musculoskeletal:     Cervical back: No rigidity.  Skin:  General: Skin is warm and dry.  Neurological:     Mental Status: She is alert and oriented to person, place, and time.  Psychiatric:        Behavior: Behavior normal.    Ortho Exam patient has pain with cervical compression and with cervical rotation.  Upper extremity reflexes are 2+ and symmetrical. Normal heel toe gait no lower extremity hyperreflexia.  No clonus. Specialty Comments:  No specialty comments available.  Imaging: CLINICAL DATA:  60 year old female with neck pain, bilateral shoulder pain radiating down the right arm for 2 months. No known injury.   EXAM: MRI CERVICAL SPINE WITHOUT CONTRAST   TECHNIQUE: Multiplanar, multisequence MR imaging of the cervical spine was performed. No intravenous contrast was administered.   COMPARISON:  Cervical spine CT 07/17/2010.   FINDINGS: Alignment: Chronic straightening of cervical lordosis. No spondylolisthesis.   Vertebrae: No marrow edema or evidence of acute osseous  abnormality. Visualized bone marrow signal is within normal limits.   Cord: Normal.  Spinal canal patency appears relatively normal.   Posterior Fossa, vertebral arteries, paraspinal tissues: Cervicomedullary junction is within normal limits. Negative visible posterior fossa. Major vascular flow voids in the neck are preserved. Negative visible neck soft tissues and lung apices.   Disc levels:   C2-C3: Mild to moderate facet hypertrophy greater on the right. No spinal stenosis. Mild to moderate right C3 foraminal stenosis.   C3-C4: Mild facet hypertrophy greater on the left. No spinal stenosis. Mild left C4 foraminal stenosis.   C4-C5:  Minimal disc bulge.  Mild facet hypertrophy.  No stenosis.   C5-C6:  Minimal disc bulge.  No stenosis.   C6-C7: Small right paracentral disc protrusion best seen on series 6, image 20. But no associated stenosis.   C7-T1:  Mild facet hypertrophy.  No stenosis.   Negative visible upper thoracic levels.   IMPRESSION: 1. Mild for age cervical spine degeneration. No spinal stenosis or acute osseous abnormality. 2. The dominant degenerative finding is facet hypertrophy, up to moderate in the upper cervical spine and associated with mild to moderate right C3 neural foraminal stenosis.     Electronically Signed   By: Genevie Ann M.D.   On: 06/20/2021 07:14   PMFS History: Patient Active Problem List   Diagnosis Date Noted   Facet arthropathy, cervical 06/29/2021   Foraminal stenosis of cervical region 06/29/2021   Class II obesity 02/02/2021   Iron deficiency anemia 10/01/2020   Postural dizziness with presyncope 09/30/2020   Hypertriglyceridemia 04/02/2020   Aortic atherosclerosis ([place]) 02/14/2020   Nontraumatic complete tear of left rotator cuff    Diabetic neuropathy ([place]) 02/20/2019   Severe persistent asthma 01/04/2019   Subcortical microvascular ischemic occlusive disease 07/12/2018   Rapid palpitations 07/12/2018   Impingement of  left ankle joint 06/26/2018   Constipation 04/21/2018   Total knee replacement status, left 12/07/2017   Dysphagia 09/20/2017   Epiglottitis    Plantar fasciitis, right 07/13/2017   S/P total knee arthroplasty, right 06/29/2017   Osteoarthrosis, localized, primary, knee, right    Sprain of calcaneofibular ligament of right ankle 06/06/2017   Osteoarthritis of right knee 01/26/2017   Onychomycosis 10/27/2015   CKD (chronic kidney disease) stage 3, GFR 30-59 ml/min (HCC) 04/27/2015   MRSA (methicillin resistant staph aureus) culture positive 03/27/2015   History of osteomyelitis 03/27/2015   Fatty liver 09/30/2014   Hot flashes 07/15/2014   Abnormal SPEP 04/17/2014   Fracture of left leg 04/17/2014   Cushingoid side effect of steroids (  [place]) 04/17/2014   Internal hemorrhoids    Pulmonary sarcoidosis ([place])    Major depression in full remission ([place])    Preoperative clearance 03/25/2014   Obstructive chronic bronchitis without exacerbation ([place]) 09/18/2013   Chest pain 04/11/2013   Hypertensive heart disease with chronic diastolic congestive heart failure ([place])    Solitary pulmonary nodule, on CT 02/2013 - stable over 2 years in 2015 02/20/2013   Gout 08/20/2010   Deficiency anemia 09/18/2009   Morbid obesity ([place]) 06/04/2009   Sleep apnea 04/21/2009   Sarcoidosis of lung ([place]) 04/10/2007   Hyperlipidemia associated with type 2 diabetes mellitus ([place]) 08/21/2006   Hypertension associated with diabetes ([place]) 08/21/2006   GERD 08/21/2006   Type II diabetes mellitus with renal manifestations ([place]) 08/21/2006   Past Medical History:  Diagnosis Date   Abnormal SPEP 04/17/2014   Acute left ankle pain 01/26/2017   ANEMIA-UNSPECIFIED 09/18/2009   Anxiety    Arthritis    Blood transfusion without reported diagnosis    Cataract    CHF (congestive heart failure) ([place])    Chronic diastolic heart failure, NYHA class 2 (HCC)    Normal LVEDP by May 2018   COPD (chronic obstructive pulmonary  disease) ([place])    Depression    DIABETES MELLITUS, TYPE II 08/21/2006   Diabetic osteomyelitis ([place]) 05/29/2015   Fatty liver    Fracture of 5th metatarsal    non union   GERD 19/75/8832   Had fundoplication   GOUT 54/98/2641   Hammer toe of right foot    2-5th toes   Hx of umbilical hernia repair    HYPERLIPIDEMIA 08/21/2006   HYPERTENSION 08/21/2006   Infection of wound due to methicillin resistant Staphylococcus aureus (MRSA)    Internal hemorrhoids    Kidney problem    Multiple allergies 10/14/2016   OBESITY 06/04/2009   Onychomycosis 10/27/2015   Osteomyelitis of left foot ([place]) 05/29/2015   Osteoporosis    Pulmonary sarcoidosis ([place])    Followed locally by pulmonology, but also by Dr. Casper Harrison at [facility]   Right knee pain 01/26/2017   Vocal cord dysfunction    Wears partial dentures     Family History  Problem Relation Age of Onset   Diabetes Father    Heart attack Father 39   Coronary artery disease Father    Heart failure Father    Throat cancer Father    Hypertension Father    Heart disease Father    Sleep apnea Father    Obesity Father    COPD Mother    Emphysema Mother    Asthma Mother    Heart failure Mother    Breast cancer Mother    Diabetes Mother    Kidney disease Mother    Thyroid disease Mother    Heart attack Maternal Grandfather    Sarcoidosis Maternal Uncle    Lung cancer Brother    Diabetes Brother    Colon cancer Neg Hx    Colon polyps Neg Hx    Esophageal cancer Neg Hx    Rectal cancer Neg Hx    Stomach cancer Neg Hx     Past Surgical History:  Procedure Laterality Date   ABDOMINAL HYSTERECTOMY     APPENDECTOMY     BLADDER SUSPENSION  11/11/2011   Procedure: TRANSVAGINAL TAPE (TVT) PROCEDURE;  Surgeon: Olga Millers, MD;  Location: [facility];  Service: Gynecology;  Laterality: N/A;   CAROTIDS  02/18/11   CAROTID DUPLEX; VERTEBRALS ARE PATENT WITH  ANTEGRADE FLOW. ICA/CCA RATIO 1.61 ON RIGHT AND 0.75 ON LEFT   CATARACT  EXTRACTION, BILATERAL     CHOLECYSTECTOMY  1984   COLONOSCOPY  04/29/2010   [name]   CYSTOSCOPY  11/11/2011   Procedure: CYSTOSCOPY;  Surgeon: Olga Millers, MD;  Location: [facility];  Service: Gynecology;  Laterality: N/A;   EXTUBATION (ENDOTRACHEAL) IN OR N/A 09/23/2017   Procedure: EXTUBATION (ENDOTRACHEAL) IN OR;  Surgeon: Helayne Seminole, MD;  Location: [facility];  Service: ENT;  Laterality: N/A;   FIBEROPTIC LARYNGOSCOPY AND TRACHEOSCOPY N/A 09/23/2017   Procedure: FLEXIBLE FIBEROPTIC LARYNGOSCOPY;  Surgeon: Helayne Seminole, MD;  Location: [facility];  Service: ENT;  Laterality: N/A;   FRACTURE SURGERY     foot   [place] Left 10/02/2018   Procedure: HALLUX ARTHRODESIS;  Surgeon: Edrick Kins, DPM;  Location: [facility];  Service: Podiatry;  Laterality: Left;   HAMMER TOE SURGERY Left 10/02/2018   Procedure: HAMMER TOE CORRECTION 2ND 3RD 4RD FIFTH TOE;  Surgeon: Edrick Kins, DPM;  Location: [facility];  Service: Podiatry;  Laterality: Left;   HAMMER TOE SURGERY Right 04/12/2019   Procedure: HAMMER TOE CORRECTION, 2ND, 3RD, 4TH AND 5TH TOES OF RIGHT FOOT;  Surgeon: Edrick Kins, DPM;  Location: [facility];  Service: Podiatry;  Laterality: Right;   HAMMER TOE SURGERY Left 10/25/2019   Procedure: HAMMER TOE REPAIR SECOND, THIRD, FOUTH;  Surgeon: Edrick Kins, DPM;  Location: [facility];  Service: Podiatry;  Laterality: Left;   HERNIA REPAIR     I & D EXTREMITY Left 06/27/2015   Procedure: Partial Excision Left Calcaneus, Place Antibiotic Beads, and Wound VAC;  Surgeon: Newt Minion, MD;  Location: [facility];  Service: Orthopedics;  Laterality: Left;   JOINT REPLACEMENT     KNEE ARTHROSCOPY     right   LEFT AND RIGHT HEART CATHETERIZATION WITH CORONARY ANGIOGRAM N/A 04/23/2013   Procedure: LEFT AND RIGHT HEART CATHETERIZATION WITH CORONARY ANGIOGRAM;  Surgeon: Leonie Man, MD;  Location: [facility];  Service: Cardiovascular;  Laterality: N/A;   LEFT HEART CATH AND CORONARY ANGIOGRAPHY N/A  03/11/2017   Procedure: Left Heart Cath and Coronary Angiography;  Surgeon: Sherren Mocha, MD;  Location: [facility]; angiographically minimal CAD in the LAD otherwise normal.;  Normal LVEDP.  FALSE POSITIVE MYOVIEW   LEFT HEART CATH AND CORONARY ANGIOGRAPHY  07/20/2010   LVEF 50-55% WITH VERY MILD GLOBAL HYPOKINESIA; ESSENTIALLY NORMAL CORONARY ARTERIES; NORMAL LV FUNCTION   METATARSAL OSTEOTOMY WITH OPEN REDUCTION INTERNAL FIXATION (ORIF) METATARSAL WITH FUSION Left 04/09/2014   Procedure: LEFT FOOT FRACTURE OPEN TREATMENT METATARSAL INCLUDES INTERNAL FIXATION EACH;  Surgeon: Lorn Junes, MD;  Location: [facility];  Service: Orthopedics;  Laterality: Left;   NISSEN FUNDOPLICATION  [id number]   NM MYOVIEW LTD --> FALSE POSITIVE  03/10/2017   : Moderate size "stress-induced "perfusion defect at the apex as well as "ill-defined stress-induced perfusion defect in the lateral wall.  EF 55%.  INTERMEDIATE risk. -->  FALSE POSITIVE   ORIF DISTAL FEMUR FRACTURE  08/08/2020   [facility]: ORIF of left extra-articular periprosthetic distal femur fracture with synthesis of the femur plate with cortical nonlocking screws.  (Thought to be pathologic fracture due to osteoporosis) -> after a fall   ORIF FEMUR W/ PERI-IMPLANT  08/29/2020   [facility]) revision of left femur fracture ORIF plate fixation screws; culture positive for MSSA   Right and left CARDIAC  CATHETERIZATION  04/23/2013   Angiographic normal coronaries; LVEDP 20 mmHg, PCWP 12-14 mmHg, RAP 12 mmHg.; Fick CO/CI 4.9/2.2   SHOULDER ARTHROSCOPY Left 03/14/2019   Procedure: LEFT SHOULDER ARTHROSCOPY, DEBRIDEMENT, AND DECOMPRESSION;  Surgeon: Newt Minion, MD;  Location: [facility];  Service: Orthopedics;  Laterality: Left;   TOTAL KNEE ARTHROPLASTY Right 06/29/2017   Procedure: RIGHT TOTAL KNEE ARTHROPLASTY;  Surgeon: Newt Minion, MD;  Location: [facility];  Service: Orthopedics;  Laterality: Right;   TOTAL KNEE  ARTHROPLASTY Left 12/07/2017   TOTAL KNEE ARTHROPLASTY Left 12/07/2017   Procedure: LEFT TOTAL KNEE ARTHROPLASTY;  Surgeon: Newt Minion, MD;  Location: [facility];  Service: Orthopedics;  Laterality: Left;   TRACHEOSTOMY TUBE PLACEMENT N/A 09/20/2017   Procedure: AWAKE INTUBATION WITH ANESTHESIA WITH VIDEO ASSISTANCE;  Surgeon: Helayne Seminole, MD;  Location: [facility];  Service: ENT;  Laterality: N/A;   TRANSTHORACIC ECHOCARDIOGRAM  08/2014   Normal LV size and function.  Mild LVH.  EF 55-60%.  Normal regional wall motion.  GR 1 DD.  Normal RV size and function .   TRANSTHORACIC ECHOCARDIOGRAM  08/12/2020    [facility]): Normal LV size and function.  EF 55 to 60%.  No RWM A.  Normal filling pattern.  Normal RV.  No significant valvular disease-stenosis or regurgitation.  No vegetation.   TUBAL LIGATION     with reversal in 1994   UPPER GASTROINTESTINAL ENDOSCOPY     VENTRAL HERNIA REPAIR     Social History   Occupational History   Occupation: DISABLED  Tobacco Use   Smoking status: Never   Smokeless tobacco: Never  Vaping Use   Vaping Use: Never used  Substance and Sexual Activity   Alcohol use: No    Alcohol/week: 0.0 standard drinks   Drug use: No   Sexual activity: Yes    Birth control/protection: Surgical

## 2021-07-06 ENCOUNTER — Encounter: Payer: 59 | Admitting: Physical Therapy

## 2021-07-07 ENCOUNTER — Encounter: Payer: Self-pay | Admitting: Physician Assistant

## 2021-07-07 ENCOUNTER — Ambulatory Visit (INDEPENDENT_AMBULATORY_CARE_PROVIDER_SITE_OTHER): Payer: 59 | Admitting: Physician Assistant

## 2021-07-07 ENCOUNTER — Other Ambulatory Visit: Payer: Self-pay

## 2021-07-07 ENCOUNTER — Ambulatory Visit (HOSPITAL_BASED_OUTPATIENT_CLINIC_OR_DEPARTMENT_OTHER): Payer: 59

## 2021-07-07 VITALS — BP 140/62 | HR 82 | Temp 97.8°F | Wt 227.6 lb

## 2021-07-07 DIAGNOSIS — R519 Headache, unspecified: Secondary | ICD-10-CM

## 2021-07-07 DIAGNOSIS — Z23 Encounter for immunization: Secondary | ICD-10-CM

## 2021-07-07 DIAGNOSIS — R0981 Nasal congestion: Secondary | ICD-10-CM

## 2021-07-07 MED ORDER — PREDNISONE 20 MG PO TABS
40.0000 mg | ORAL_TABLET | Freq: Every day | ORAL | 0 refills | Status: DC
Start: 2021-07-07 — End: 2021-07-21

## 2021-07-07 MED ORDER — AMOXICILLIN-POT CLAVULANATE 875-125 MG PO TABS: 1.0000 | ORAL_TABLET | Freq: Two times a day (BID) | ORAL | 0 refills | Status: DC

## 2021-07-07 NOTE — Progress Notes (Deleted)
[name] is a 60 y.o. female here for a new problem.     History of Present Illness:   No chief complaint on file.   HPI  Headaches  Past Medical History:  Diagnosis Date   Abnormal SPEP 04/17/2014   Acute left ankle pain 01/26/2017   ANEMIA-UNSPECIFIED 09/18/2009   Anxiety    Arthritis    Blood transfusion without reported diagnosis    Cataract    CHF (congestive heart failure) (HCC)    Chronic diastolic heart failure, NYHA class 2 (HCC)    Normal LVEDP by May 2018   COPD (chronic obstructive pulmonary disease) ([place])    Depression    DIABETES MELLITUS, TYPE II 08/21/2006   Diabetic osteomyelitis ([place]) 05/29/2015   Fatty liver    Fracture of 5th metatarsal    non union   GERD 60/73/7106   Had fundoplication   GOUT 26/94/8546   Hammer toe of right foot    2-5th toes   Hx of umbilical hernia repair    HYPERLIPIDEMIA 08/21/2006   HYPERTENSION 08/21/2006   Infection of wound due to methicillin resistant Staphylococcus aureus (MRSA)    Internal hemorrhoids    Kidney problem    Multiple allergies 10/14/2016   OBESITY 06/04/2009   Onychomycosis 10/27/2015   Osteomyelitis of left foot ([place]) 05/29/2015   Osteoporosis    Pulmonary sarcoidosis ([place])    Followed locally by pulmonology, but also by Dr. Casper Harrison at [facility]   Right knee pain 01/26/2017   Vocal cord dysfunction    Wears partial dentures      Social History   Tobacco Use   Smoking status: Never   Smokeless tobacco: Never  Vaping Use   Vaping Use: Never used  Substance Use Topics   Alcohol use: No    Alcohol/week: 0.0 standard drinks   Drug use: No    Past Surgical History:  Procedure Laterality Date   ABDOMINAL HYSTERECTOMY     APPENDECTOMY     BLADDER SUSPENSION  11/11/2011   Procedure: TRANSVAGINAL TAPE (TVT) PROCEDURE;  Surgeon: Olga Millers, MD;  Location: [facility];  Service: Gynecology;  Laterality: N/A;   CAROTIDS  02/18/11   CAROTID DUPLEX; VERTEBRALS ARE PATENT WITH  ANTEGRADE FLOW. ICA/CCA RATIO 1.61 ON RIGHT AND 0.75 ON LEFT   CATARACT EXTRACTION, BILATERAL     CHOLECYSTECTOMY  1984   COLONOSCOPY  04/29/2010   [name]   CYSTOSCOPY  11/11/2011   Procedure: CYSTOSCOPY;  Surgeon: Olga Millers, MD;  Location: [facility];  Service: Gynecology;  Laterality: N/A;   EXTUBATION (ENDOTRACHEAL) IN OR N/A 09/23/2017   Procedure: EXTUBATION (ENDOTRACHEAL) IN OR;  Surgeon: Helayne Seminole, MD;  Location: [place];  Service: ENT;  Laterality: N/A;   FIBEROPTIC LARYNGOSCOPY AND TRACHEOSCOPY N/A 09/23/2017   Procedure: FLEXIBLE FIBEROPTIC LARYNGOSCOPY;  Surgeon: Helayne Seminole, MD;  Location: [place];  Service: ENT;  Laterality: N/A;   FRACTURE SURGERY     foot   [place] Left 10/02/2018   Procedure: HALLUX ARTHRODESIS;  Surgeon: Edrick Kins, DPM;  Location: [facility];  Service: Podiatry;  Laterality: Left;   HAMMER TOE SURGERY Left 10/02/2018   Procedure: HAMMER TOE CORRECTION 2ND 3RD 4RD FIFTH TOE;  Surgeon: Edrick Kins, DPM;  Location: [facility];  Service: Podiatry;  Laterality: Left;   HAMMER TOE SURGERY Right 04/12/2019   Procedure: HAMMER TOE CORRECTION, 2ND, 3RD, 4TH AND 5TH TOES OF RIGHT FOOT;  Surgeon: Edrick Kins, DPM;  Location: [place];  Service: Podiatry;  Laterality: Right;   HAMMER TOE SURGERY Left 10/25/2019   Procedure: HAMMER TOE REPAIR SECOND, THIRD, FOUTH;  Surgeon: Edrick Kins, DPM;  Location: [place] [facility];  Service: Podiatry;  Laterality: Left;   HERNIA REPAIR     I & D EXTREMITY Left 06/27/2015   Procedure: Partial Excision Left Calcaneus, Place Antibiotic Beads, and Wound VAC;  Surgeon: Newt Minion, MD;  Location: [place];  Service: Orthopedics;  Laterality: Left;   JOINT REPLACEMENT     KNEE ARTHROSCOPY     right   LEFT AND RIGHT HEART CATHETERIZATION WITH CORONARY ANGIOGRAM N/A 04/23/2013   Procedure: LEFT AND RIGHT HEART CATHETERIZATION WITH CORONARY ANGIOGRAM;  Surgeon: Leonie Man, MD;  Location: [facility];  Service: Cardiovascular;   Laterality: N/A;   LEFT HEART CATH AND CORONARY ANGIOGRAPHY N/A 03/11/2017   Procedure: Left Heart Cath and Coronary Angiography;  Surgeon: Sherren Mocha, MD;  Location: [place] [facility]; angiographically minimal CAD in the LAD otherwise normal.;  Normal LVEDP.  FALSE POSITIVE MYOVIEW   LEFT HEART CATH AND CORONARY ANGIOGRAPHY  07/20/2010   LVEF 50-55% WITH VERY MILD GLOBAL HYPOKINESIA; ESSENTIALLY NORMAL CORONARY ARTERIES; NORMAL LV FUNCTION   METATARSAL OSTEOTOMY WITH OPEN REDUCTION INTERNAL FIXATION (ORIF) METATARSAL WITH FUSION Left 04/09/2014   Procedure: LEFT FOOT FRACTURE OPEN TREATMENT METATARSAL INCLUDES INTERNAL FIXATION EACH;  Surgeon: Lorn Junes, MD;  Location: [place];  Service: Orthopedics;  Laterality: Left;   NISSEN FUNDOPLICATION  6384   NM MYOVIEW LTD --> FALSE POSITIVE  03/10/2017   : Moderate size "stress-induced "perfusion defect at the apex as well as "ill-defined stress-induced perfusion defect in the lateral wall.  EF 55%.  INTERMEDIATE risk. -->  FALSE POSITIVE   ORIF DISTAL FEMUR FRACTURE  08/08/2020   [facility] [place]: ORIF of left extra-articular periprosthetic distal femur fracture with synthesis of the femur plate with cortical nonlocking screws.  (Thought to be pathologic fracture due to osteoporosis) -> after a fall   ORIF FEMUR W/ PERI-IMPLANT  08/29/2020   [facility]) revision of left femur fracture ORIF plate fixation screws; culture positive for MSSA   Right and left CARDIAC CATHETERIZATION  04/23/2013   Angiographic normal coronaries; LVEDP 20 mmHg, PCWP 12-14 mmHg, RAP 12 mmHg.; Fick CO/CI 4.9/2.2   SHOULDER ARTHROSCOPY Left 03/14/2019   Procedure: LEFT SHOULDER ARTHROSCOPY, DEBRIDEMENT, AND DECOMPRESSION;  Surgeon: Newt Minion, MD;  Location: [place];  Service: Orthopedics;  Laterality: Left;   TOTAL KNEE ARTHROPLASTY Right 06/29/2017   Procedure: RIGHT TOTAL KNEE ARTHROPLASTY;  Surgeon: Newt Minion, MD;  Location:  [place];  Service: Orthopedics;  Laterality: Right;   TOTAL KNEE ARTHROPLASTY Left 12/07/2017   TOTAL KNEE ARTHROPLASTY Left 12/07/2017   Procedure: LEFT TOTAL KNEE ARTHROPLASTY;  Surgeon: Newt Minion, MD;  Location: [place];  Service: Orthopedics;  Laterality: Left;   TRACHEOSTOMY TUBE PLACEMENT N/A 09/20/2017   Procedure: AWAKE INTUBATION WITH ANESTHESIA WITH VIDEO ASSISTANCE;  Surgeon: Helayne Seminole, MD;  Location: [place] [facility];  Service: ENT;  Laterality: N/A;   TRANSTHORACIC ECHOCARDIOGRAM  08/2014   Normal LV size and function.  Mild LVH.  EF 55-60%.  Normal regional wall motion.  GR 1 DD.  Normal RV size and function .   TRANSTHORACIC ECHOCARDIOGRAM  08/12/2020    [facility]): Normal LV size and function.  EF 55 to 60%.  No RWM A.  Normal filling  pattern.  Normal RV.  No significant valvular disease-stenosis or regurgitation.  No vegetation.   TUBAL LIGATION     with reversal in [place]      Family History  Problem Relation Age of Onset   Diabetes Father    Heart attack Father 6   Coronary artery disease Father    Heart failure Father    Throat cancer Father    Hypertension Father    Heart disease Father    Sleep apnea Father    Obesity Father    COPD Mother    Emphysema Mother    Asthma Mother    Heart failure Mother    Breast cancer Mother    Diabetes Mother    Kidney disease Mother    Thyroid disease Mother    Heart attack Maternal Grandfather    Sarcoidosis Maternal Uncle    Lung cancer Brother    Diabetes Brother    Colon cancer Neg Hx    Colon polyps Neg Hx    Esophageal cancer Neg Hx    Rectal cancer Neg Hx    Stomach cancer Neg Hx     Allergies  Allergen Reactions   Methotrexate Other (See Comments)    Peri-oral and buccal lesions   Vancomycin Other (See Comments)    DOSE RELATED NEPHROTOXICITY   Lisinopril Cough   Chlorhexidine Itching   Clindamycin/Lincomycin Nausea And Vomiting  and Rash   Doxycycline Rash   Teflaro [Ceftaroline] Rash and Other (See Comments)    Tolerates ceftriaxone     Current Medications:   Current Outpatient Medications:    albuterol (PROVENTIL HFA;VENTOLIN HFA) 108 (90 Base) MCG/ACT inhaler, Inhale 1-2 puffs into the lungs every 6 (six) hours as needed for wheezing or shortness of breath., Disp: 8 g, Rfl: 5   allopurinol (ZYLOPRIM) 100 MG tablet, Take 100 mg by mouth every evening. , Disp: , Rfl:    allopurinol (ZYLOPRIM) 100 MG tablet, Take one po daily for one week then take 2 tablets daily for 1 week then 3 tablets daily., Disp: 60 tablet, Rfl: 0   aspirin EC 81 MG tablet, Take 1 tablet (81 mg total) by mouth daily., Disp: 30 tablet, Rfl: 11   atorvastatin (LIPITOR) 40 MG tablet, TAKE 1 TABLET BY MOUTH EVERY DAY, Disp: 90 tablet, Rfl: 3   BREO ELLIPTA 200-25 MCG/INH AEPB, TAKE 1 PUFF BY MOUTH EVERY DAY, Disp: 60 each, Rfl: 5   buPROPion (WELLBUTRIN XL) 300 MG 24 hr tablet, TAKE 1 TABLET BY MOUTH EVERY DAY, Disp: 90 tablet, Rfl: 3   carvedilol (COREG) 12.5 MG tablet, Take 12.5 mg by mouth 2 (two) times daily with a meal., Disp: , Rfl:    Cholecalciferol 25 MCG (1000 UT) capsule, Take 1,000 Units by mouth in the morning and at bedtime., Disp: , Rfl:    diclofenac Sodium (VOLTAREN) 1 % GEL, Apply 1 application topically as needed (pain.). , Disp: , Rfl:    diltiazem (CARDIZEM CD) 120 MG 24 hr capsule, Take 1 capsule (120 mg total) by mouth daily., Disp: 90 capsule, Rfl: 3   famotidine (PEPCID) 20 MG tablet, Take 1 tablet (20 mg total) by mouth at bedtime., Disp: 30 tablet, Rfl: 3   fenofibrate 160 MG tablet, TAKE 1 TABLET BY MOUTH EVERY DAY, Disp: 90 tablet, Rfl: 3   fluticasone (FLONASE) 50 MCG/ACT nasal spray, Place 2 sprays into both nostrils daily as needed for allergies., Disp:  48 mL, Rfl: 3   furosemide (LASIX) 40 MG tablet, TAKE 1 TABLET BY MOUTH TWICE A DAY, Disp: 180 tablet, Rfl: 1   gabapentin (NEURONTIN) 100 MG capsule, TAKE 1 CAPSULE  BY MOUTH THREE TIMES A DAY, Disp: 270 capsule, Rfl: 3   glucose blood (CONTOUR NEXT TEST) test strip, 1 each by Other route 4 (four) times daily. And lancets 4/day, Disp: 400 each, Rfl: 3   insulin glargine (LANTUS SOLOSTAR) 100 UNIT/ML Solostar Pen, Inject 22 Units into the skin every morning., Disp: 30 mL, Rfl: 2   insulin lispro (HUMALOG KWIKPEN) 100 UNIT/ML KwikPen, Inject 0.15 mLs (15 Units total) into the skin daily with supper., Disp: 15 mL, Rfl: 29   Insulin Pen Needle (PEN NEEDLES) 32G X 4 MM MISC, 1 each by Does not apply route 4 (four) times daily. E11.9, Disp: 400 each, Rfl: 0   ipratropium (ATROVENT) 0.03 % nasal spray, PLACE 2 SPRAYS INTO BOTH NOSTRILS EVERY 12 (TWELVE) HOURS., Disp: 30 mL, Rfl: 2   KLOR-CON M20 20 MEQ tablet, TAKE 1 & 1/2 TABLETS BY MOUTH TWICE A DAY, Disp: 270 tablet, Rfl: 2   lactulose (CHRONULAC) 10 GM/15ML solution, TAKE 15 MLS BY MOUTH DAILY AS NEEDED FOR CONSTIPATION., Disp: 1419 mL, Rfl: 1   Lancet Devices (EASY MINI EJECT LANCING DEVICE) MISC, Use as directed 4 times daily to test blood sugar, Disp: 1 each, Rfl: 3   linaclotide (LINZESS) 290 MCG CAPS capsule, Take by mouth as needed., Disp: , Rfl:    LORazepam (ATIVAN) 0.5 MG tablet, Take 0.5 mg by mouth every 12 (twelve) hours as needed for anxiety., Disp: , Rfl:    meloxicam (MOBIC) 15 MG tablet, Take 1 tablet (15 mg total) by mouth daily., Disp: 30 tablet, Rfl: 1   metFORMIN (GLUCOPHAGE-XR) 500 MG 24 hr tablet, Take 4 tablets (2,000 mg total) by mouth daily with breakfast., Disp: 360 tablet, Rfl: 3   methocarbamol (ROBAXIN) 500 MG tablet, Take 1 tablet (500 mg total) by mouth every 8 (eight) hours as needed for muscle spasms., Disp: 40 tablet, Rfl: 0   Microlet Lancets MISC, 1 each by Does not apply route 2 (two) times daily. E11.9, Disp: 100 each, Rfl: 2   nitroGLYCERIN (NITROSTAT) 0.4 MG SL tablet, Place 1 tablet (0.4 mg total) under the tongue every 5 (five) minutes as needed for chest pain. Reported on  01/05/2016, Disp: 25 tablet, Rfl: 6   nystatin (MYCOSTATIN) 100000 UNIT/ML suspension, nystatin 100,000 unit/mL oral suspension  SWISH, GARGLE, AND SPIT 20 ML 4 TIMES A DAY UNTIL RESOLVED, Disp: , Rfl:    ondansetron (ZOFRAN-ODT) 4 MG disintegrating tablet, Take 1 tablet (4 mg total) by mouth every 8 (eight) hours as needed for nausea or vomiting., Disp: 50 tablet, Rfl: 2   RABEprazole (ACIPHEX) 20 MG tablet, Take 1 tablet (20 mg total) by mouth daily., Disp: 90 tablet, Rfl: 3   telmisartan (MICARDIS) 20 MG tablet, TAKE 1 TABLET BY MOUTH EVERY DAY, Disp: 90 tablet, Rfl: 1   TRULICITY 3 QI/2.9NL SOPN, INJECT 0.5 MLS (3 MG TOTAL) INTO THE SKIN ONCE A WEEK., Disp: 12 mL, Rfl: 3   venlafaxine XR (EFFEXOR-XR) 75 MG 24 hr capsule, TAKE 1 CAPSULE BY MOUTH TWICE A DAY, Disp: 180 capsule, Rfl: 3  Current Facility-Administered Medications:    cyanocobalamin ((VITAMIN B-12)) injection 1,000 mcg, 1,000 mcg, Intramuscular, Once, [name], MD   Review of Systems:   ROS  Vitals:   There were no vitals filed for  this visit.   There is no height or weight on file to calculate BMI.  Physical Exam:   Physical Exam  Assessment and Plan:   There are no diagnoses linked to this encounter.     ***  Inda Coke, PA-C

## 2021-07-07 NOTE — Progress Notes (Signed)
[name] is a 60 y.o. female here for a new problem.   History of Present Illness:   Chief Complaint  Patient presents with  . Migraine    Ongoing for 3 weeks, constant pain in the front and back of head. No relief with any OTC medications      HPI  Headaches She has a frontal headache that is consistent for 3 weeks. She rates her pain 7/10 and has taken Tylenol to improve symptoms. However she notes that her headache returns soon after the medication wears off. She relates her headache to be possibly a sinus infection. She also has some associated nausea, nasal post-drip, and congestion. Describes this being the worse headache of her life, but this is because her symptom have not resolved. Denies having blurred vision, one sided weakness on one side of body, or vomiting, loss of vision. She has described this as the worst headache of her life. She has been completing physical therapy to treat her neck pain, she does not relate this to her current symptoms. Recommended not to take steroids because of her diabetes in the past. Notes taking Flonase daily for seasonal allergies.   Past Medical History:  Diagnosis Date  . Abnormal SPEP 04/17/2014  . Acute left ankle pain 01/26/2017  . ANEMIA-UNSPECIFIED 09/18/2009  . Anxiety   . Arthritis   . Blood transfusion without reported diagnosis   . Cataract   . CHF (congestive heart failure) ([place])   . Chronic diastolic heart failure, NYHA class 2 (HCC)    Normal LVEDP by May 2018  . COPD (chronic obstructive pulmonary disease) ([place])   . Depression   . DIABETES MELLITUS, TYPE II 08/21/2006  . Diabetic osteomyelitis ([place]) 05/29/2015  . Fatty liver   . Fracture of 5th metatarsal    non union  . GERD 13/05/6577   Had fundoplication  . GOUT 08/20/2010  . Hammer toe of right foot    2-5th toes  . Hx of umbilical hernia repair   . HYPERLIPIDEMIA 08/21/2006  . HYPERTENSION 08/21/2006  . Infection of wound due to methicillin resistant  Staphylococcus aureus (MRSA)   . Internal hemorrhoids   . Kidney problem   . Multiple allergies 10/14/2016  . OBESITY 06/04/2009  . Onychomycosis 10/27/2015  . Osteomyelitis of left foot ([place]) 05/29/2015  . Osteoporosis   . Pulmonary sarcoidosis ([place])    Followed locally by pulmonology, but also by Dr. Casper Harrison at [facility]  . Right knee pain 01/26/2017  . Vocal cord dysfunction   . Wears partial dentures      Social History   Tobacco Use  . Smoking status: Never  . Smokeless tobacco: Never  Vaping Use  . Vaping Use: Never used  Substance Use Topics  . Alcohol use: No    Alcohol/week: 0.0 standard drinks  . Drug use: No    Past Surgical History:  Procedure Laterality Date  . ABDOMINAL HYSTERECTOMY    . APPENDECTOMY    . BLADDER SUSPENSION  11/11/2011   Procedure: TRANSVAGINAL TAPE (TVT) PROCEDURE;  Surgeon: Olga Millers, MD;  Location: [facility];  Service: Gynecology;  Laterality: N/A;  . CAROTIDS  02/18/11   CAROTID DUPLEX; VERTEBRALS ARE PATENT WITH ANTEGRADE FLOW. ICA/CCA RATIO 1.61 ON RIGHT AND 0.75 ON LEFT  . CATARACT EXTRACTION, BILATERAL    . CHOLECYSTECTOMY  1984  . COLONOSCOPY  04/29/2010   [name]  . CYSTOSCOPY  11/11/2011   Procedure: CYSTOSCOPY;  Surgeon: Olga Millers, MD;  Location: [facility];  Service: Gynecology;  Laterality: N/A;  . EXTUBATION (ENDOTRACHEAL) IN OR N/A 09/23/2017   Procedure: EXTUBATION (ENDOTRACHEAL) IN OR;  Surgeon: Helayne Seminole, MD;  Location: [place];  Service: ENT;  Laterality: N/A;  . FIBEROPTIC LARYNGOSCOPY AND TRACHEOSCOPY N/A 09/23/2017   Procedure: FLEXIBLE FIBEROPTIC LARYNGOSCOPY;  Surgeon: Helayne Seminole, MD;  Location: [place];  Service: ENT;  Laterality: N/A;  . FRACTURE SURGERY     foot  . HALLUX FUSION Left 10/02/2018   Procedure: HALLUX ARTHRODESIS;  Surgeon: Edrick Kins, DPM;  Location: [facility];  Service: Podiatry;  Laterality: Left;  . HAMMER TOE SURGERY Left 10/02/2018   Procedure: HAMMER TOE  CORRECTION 2ND 3RD 4RD FIFTH TOE;  Surgeon: Edrick Kins, DPM;  Location: [facility];  Service: Podiatry;  Laterality: Left;  . HAMMER TOE SURGERY Right 04/12/2019   Procedure: HAMMER TOE CORRECTION, 2ND, 3RD, 4TH AND 5TH TOES OF RIGHT FOOT;  Surgeon: Edrick Kins, DPM;  Location: [place];  Service: Podiatry;  Laterality: Right;  . HAMMER TOE SURGERY Left 10/25/2019   Procedure: HAMMER TOE REPAIR SECOND, THIRD, FOUTH;  Surgeon: Edrick Kins, DPM;  Location: [facility];  Service: Podiatry;  Laterality: Left;  . HERNIA REPAIR    . I & D EXTREMITY Left 06/27/2015   Procedure: Partial Excision Left Calcaneus, Place Antibiotic Beads, and Wound VAC;  Surgeon: Newt Minion, MD;  Location: [place];  Service: Orthopedics;  Laterality: Left;  . JOINT REPLACEMENT    . KNEE ARTHROSCOPY     right  . LEFT AND RIGHT HEART CATHETERIZATION WITH CORONARY ANGIOGRAM N/A 04/23/2013   Procedure: LEFT AND RIGHT HEART CATHETERIZATION WITH CORONARY ANGIOGRAM;  Surgeon: Leonie Man, MD;  Location: [facility] CATH LAB;  Service: Cardiovascular;  Laterality: N/A;  . LEFT HEART CATH AND CORONARY ANGIOGRAPHY N/A 03/11/2017   Procedure: Left Heart Cath and Coronary Angiography;  Surgeon: Sherren Mocha, MD;  Location: [place] CV LAB; angiographically minimal CAD in the LAD otherwise normal.;  Normal LVEDP.  FALSE POSITIVE MYOVIEW  . LEFT HEART CATH AND CORONARY ANGIOGRAPHY  07/20/2010   LVEF 50-55% WITH VERY MILD GLOBAL HYPOKINESIA; ESSENTIALLY NORMAL CORONARY ARTERIES; NORMAL LV FUNCTION  . METATARSAL OSTEOTOMY WITH OPEN REDUCTION INTERNAL FIXATION (ORIF) METATARSAL WITH FUSION Left 04/09/2014   Procedure: LEFT FOOT FRACTURE OPEN TREATMENT METATARSAL INCLUDES INTERNAL FIXATION EACH;  Surgeon: Lorn Junes, MD;  Location: [place];  Service: Orthopedics;  Laterality: Left;  . NISSEN FUNDOPLICATION  7353  . NM MYOVIEW LTD --> FALSE POSITIVE  03/10/2017   : Moderate size "stress-induced "perfusion defect at the apex as  well as "ill-defined stress-induced perfusion defect in the lateral wall.  EF 55%.  INTERMEDIATE risk. -->  FALSE POSITIVE  . ORIF DISTAL FEMUR FRACTURE  08/08/2020   [facility]: ORIF of left extra-articular periprosthetic distal femur fracture with synthesis of the femur plate with cortical nonlocking screws.  (Thought to be pathologic fracture due to osteoporosis) -> after a fall  . ORIF FEMUR W/ PERI-IMPLANT  08/29/2020   [facility]) revision of left femur fracture ORIF plate fixation screws; culture positive for MSSA  . Right and left CARDIAC CATHETERIZATION  04/23/2013   Angiographic normal coronaries; LVEDP 20 mmHg, PCWP 12-14 mmHg, RAP 12 mmHg.; Fick CO/CI 4.9/2.2  . SHOULDER ARTHROSCOPY Left 03/14/2019   Procedure: LEFT SHOULDER ARTHROSCOPY, DEBRIDEMENT, AND DECOMPRESSION;  Surgeon: Newt Minion, MD;  Location: [place];  Service:  Orthopedics;  Laterality: Left;  . TOTAL KNEE ARTHROPLASTY Right 06/29/2017   Procedure: RIGHT TOTAL KNEE ARTHROPLASTY;  Surgeon: Newt Minion, MD;  Location: [place];  Service: Orthopedics;  Laterality: Right;  . TOTAL KNEE ARTHROPLASTY Left 12/07/2017  . TOTAL KNEE ARTHROPLASTY Left 12/07/2017   Procedure: LEFT TOTAL KNEE ARTHROPLASTY;  Surgeon: Newt Minion, MD;  Location: [place];  Service: Orthopedics;  Laterality: Left;  . TRACHEOSTOMY TUBE PLACEMENT N/A 09/20/2017   Procedure: AWAKE INTUBATION WITH ANESTHESIA WITH VIDEO ASSISTANCE;  Surgeon: Helayne Seminole, MD;  Location: [facility];  Service: ENT;  Laterality: N/A;  . TRANSTHORACIC ECHOCARDIOGRAM  08/2014   Normal LV size and function.  Mild LVH.  EF 55-60%.  Normal regional wall motion.  GR 1 DD.  Normal RV size and function .  [name] TRANSTHORACIC ECHOCARDIOGRAM  08/12/2020    [facility]): Normal LV size and function.  EF 55 to 60%.  No RWM A.  Normal filling pattern.  Normal RV.  No significant valvular disease-stenosis or regurgitation.  No vegetation.  . TUBAL LIGATION      with reversal in 1994  . UPPER GASTROINTESTINAL ENDOSCOPY    . VENTRAL HERNIA REPAIR      Family History  Problem Relation Age of Onset  . Diabetes Father   . Heart attack Father 70  . Coronary artery disease Father   . Heart failure Father   . Throat cancer Father   . Hypertension Father   . Heart disease Father   . Sleep apnea Father   . Obesity Father   . COPD Mother   . Emphysema Mother   . Asthma Mother   . Heart failure Mother   . Breast cancer Mother   . Diabetes Mother   . Kidney disease Mother   . Thyroid disease Mother   . Heart attack Maternal Grandfather   . Sarcoidosis Maternal Uncle   . Lung cancer Brother   . Diabetes Brother   . Colon cancer Neg Hx   . Colon polyps Neg Hx   . Esophageal cancer Neg Hx   . Rectal cancer Neg Hx   . Stomach cancer Neg Hx     Allergies  Allergen Reactions  . Methotrexate Other (See Comments)    Peri-oral and buccal lesions  . Vancomycin Other (See Comments)    DOSE RELATED NEPHROTOXICITY  . Lisinopril Cough  . Chlorhexidine Itching  . Clindamycin/Lincomycin Nausea And Vomiting and Rash  . Doxycycline Rash  . Teflaro [Ceftaroline] Rash and Other (See Comments)    Tolerates ceftriaxone     Current Medications:   Current Outpatient Medications:  .  albuterol (PROVENTIL HFA;VENTOLIN HFA) 108 (90 Base) MCG/ACT inhaler, Inhale 1-2 puffs into the lungs every 6 (six) hours as needed for wheezing or shortness of breath., Disp: 8 g, Rfl: 5 .  allopurinol (ZYLOPRIM) 100 MG tablet, Take 100 mg by mouth every evening. , Disp: , Rfl:  .  allopurinol (ZYLOPRIM) 100 MG tablet, Take one po daily for one week then take 2 tablets daily for 1 week then 3 tablets daily., Disp: 60 tablet, Rfl: 0 .  amoxicillin-clavulanate (AUGMENTIN) 875-125 MG tablet, Take 1 tablet by mouth 2 (two) times daily., Disp: 20 tablet, Rfl: 0 .  aspirin EC 81 MG tablet, Take 1 tablet (81 mg total) by mouth daily., Disp: 30 tablet, Rfl: 11 .  atorvastatin  (LIPITOR) 40 MG tablet, TAKE 1 TABLET BY MOUTH EVERY DAY, Disp: 90 tablet, Rfl: 3 .  BREO ELLIPTA 200-25 MCG/INH AEPB, TAKE 1 PUFF BY MOUTH EVERY DAY, Disp: 60 each, Rfl: 5 .  buPROPion (WELLBUTRIN XL) 300 MG 24 hr tablet, TAKE 1 TABLET BY MOUTH EVERY DAY, Disp: 90 tablet, Rfl: 3 .  carvedilol (COREG) 12.5 MG tablet, Take 12.5 mg by mouth 2 (two) times daily with a meal., Disp: , Rfl:  .  Cholecalciferol 25 MCG (1000 UT) capsule, Take 1,000 Units by mouth in the morning and at bedtime., Disp: , Rfl:  .  diclofenac Sodium (VOLTAREN) 1 % GEL, Apply 1 application topically as needed (pain.). , Disp: , Rfl:  .  diltiazem (CARDIZEM CD) 120 MG 24 hr capsule, Take 1 capsule (120 mg total) by mouth daily., Disp: 90 capsule, Rfl: 3 .  famotidine (PEPCID) 20 MG tablet, Take 1 tablet (20 mg total) by mouth at bedtime., Disp: 30 tablet, Rfl: 3 .  fenofibrate 160 MG tablet, TAKE 1 TABLET BY MOUTH EVERY DAY, Disp: 90 tablet, Rfl: 3 .  fluticasone (FLONASE) 50 MCG/ACT nasal spray, Place 2 sprays into both nostrils daily as needed for allergies., Disp: 48 mL, Rfl: 3 .  furosemide (LASIX) 40 MG tablet, TAKE 1 TABLET BY MOUTH TWICE A DAY, Disp: 180 tablet, Rfl: 1 .  gabapentin (NEURONTIN) 100 MG capsule, TAKE 1 CAPSULE BY MOUTH THREE TIMES A DAY, Disp: 270 capsule, Rfl: 3 .  glucose blood (CONTOUR NEXT TEST) test strip, 1 each by Other route 4 (four) times daily. And lancets 4/day, Disp: 400 each, Rfl: 3 .  insulin glargine (LANTUS SOLOSTAR) 100 UNIT/ML Solostar Pen, Inject 22 Units into the skin every morning., Disp: 30 mL, Rfl: 2 .  insulin lispro (HUMALOG KWIKPEN) 100 UNIT/ML KwikPen, Inject 0.15 mLs (15 Units total) into the skin daily with supper., Disp: 15 mL, Rfl: 29 .  Insulin Pen Needle (PEN NEEDLES) 32G X 4 MM MISC, 1 each by Does not apply route 4 (four) times daily. E11.9, Disp: 400 each, Rfl: 0 .  ipratropium (ATROVENT) 0.03 % nasal spray, PLACE 2 SPRAYS INTO BOTH NOSTRILS EVERY 12 (TWELVE) HOURS., Disp:  30 mL, Rfl: 2 .  KLOR-CON M20 20 MEQ tablet, TAKE 1 & 1/2 TABLETS BY MOUTH TWICE A DAY, Disp: 270 tablet, Rfl: 2 .  lactulose (CHRONULAC) 10 GM/15ML solution, TAKE 15 MLS BY MOUTH DAILY AS NEEDED FOR CONSTIPATION., Disp: 1419 mL, Rfl: 1 .  Lancet Devices (EASY MINI EJECT LANCING DEVICE) MISC, Use as directed 4 times daily to test blood sugar, Disp: 1 each, Rfl: 3 .  linaclotide (LINZESS) 290 MCG CAPS capsule, Take by mouth as needed., Disp: , Rfl:  .  LORazepam (ATIVAN) 0.5 MG tablet, Take 0.5 mg by mouth every 12 (twelve) hours as needed for anxiety., Disp: , Rfl:  .  meloxicam (MOBIC) 15 MG tablet, Take 1 tablet (15 mg total) by mouth daily., Disp: 30 tablet, Rfl: 1 .  metFORMIN (GLUCOPHAGE-XR) 500 MG 24 hr tablet, Take 4 tablets (2,000 mg total) by mouth daily with breakfast., Disp: 360 tablet, Rfl: 3 .  methocarbamol (ROBAXIN) 500 MG tablet, Take 1 tablet (500 mg total) by mouth every 8 (eight) hours as needed for muscle spasms., Disp: 40 tablet, Rfl: 0 .  Microlet Lancets MISC, 1 each by Does not apply route 2 (two) times daily. E11.9, Disp: 100 each, Rfl: 2 .  nitroGLYCERIN (NITROSTAT) 0.4 MG SL tablet, Place 1 tablet (0.4 mg total) under the tongue every 5 (five) minutes as needed for chest pain. Reported on 01/05/2016, Disp: 25  tablet, Rfl: 6 .  nystatin (MYCOSTATIN) 100000 UNIT/ML suspension, nystatin 100,000 unit/mL oral suspension  SWISH, GARGLE, AND SPIT 20 ML 4 TIMES A DAY UNTIL RESOLVED, Disp: , Rfl:  .  ondansetron (ZOFRAN-ODT) 4 MG disintegrating tablet, Take 1 tablet (4 mg total) by mouth every 8 (eight) hours as needed for nausea or vomiting., Disp: 50 tablet, Rfl: 2 .  predniSONE (DELTASONE) 20 MG tablet, Take 2 tablets (40 mg total) by mouth daily., Disp: 10 tablet, Rfl: 0 .  RABEprazole (ACIPHEX) 20 MG tablet, Take 1 tablet (20 mg total) by mouth daily., Disp: 90 tablet, Rfl: 3 .  telmisartan (MICARDIS) 20 MG tablet, TAKE 1 TABLET BY MOUTH EVERY DAY, Disp: 90 tablet, Rfl: 1 .   TRULICITY 3 SW/5.4OE SOPN, INJECT 0.5 MLS (3 MG TOTAL) INTO THE SKIN ONCE A WEEK., Disp: 12 mL, Rfl: 3 .  venlafaxine XR (EFFEXOR-XR) 75 MG 24 hr capsule, TAKE 1 CAPSULE BY MOUTH TWICE A DAY, Disp: 180 capsule, Rfl: 3  Current Facility-Administered Medications:  .  cyanocobalamin ((VITAMIN B-12)) injection 1,000 mcg, 1,000 mcg, Intramuscular, Once, [name], MD   Review of Systems:   ROS Negative unless otherwise specified per HPI.  Vitals:   Vitals:   07/07/21 0904  BP: 140/62  Pulse: 82  Temp: 97.8 F (36.6 C)  TempSrc: Temporal  SpO2: 96%  Weight: 227 lb 9.6 oz (103.2 kg)     Body mass index is 36.74 kg/m.  Physical Exam:   Physical Exam Vitals and nursing note reviewed.  Constitutional:      General: She is not in acute distress.    Appearance: Normal appearance. She is well-developed. She is not ill-appearing or toxic-appearing.  HENT:     Head: Normocephalic and atraumatic.     Right Ear: Ear canal and external ear normal. A middle ear effusion is present. Tympanic membrane is not erythematous, retracted or bulging.     Left Ear: External ear normal. A middle ear effusion is present. Tympanic membrane is not erythematous, retracted or bulging.     Nose: Nose normal.     Right Sinus: No maxillary sinus tenderness or frontal sinus tenderness.     Left Sinus: No maxillary sinus tenderness or frontal sinus tenderness.     Mouth/Throat:     Pharynx: Uvula midline. No posterior oropharyngeal erythema.  Eyes:     General: Lids are normal.     Extraocular Movements: Extraocular movements intact.     Conjunctiva/sclera: Conjunctivae normal.     Pupils: Pupils are equal, round, and reactive to light.  Neck:     Trachea: Trachea normal.  Cardiovascular:     Rate and Rhythm: Normal rate and regular rhythm.     Heart sounds: Normal heart sounds, S1 normal and S2 normal.  Pulmonary:     Effort: Pulmonary effort is normal.     Breath sounds: Normal breath sounds.  No decreased breath sounds, wheezing, rhonchi or rales.  Lymphadenopathy:     Cervical: No cervical adenopathy.  Skin:    General: Skin is warm and dry.  Neurological:     General: No focal deficit present.     Mental Status: She is alert.     Cranial Nerves: Cranial nerves are intact.     Sensory: Sensation is intact.     Motor: Motor function is intact.     Coordination: Coordination is intact.  Psychiatric:        Speech: Speech normal.  Behavior: Behavior normal. Behavior is cooperative.    Assessment and Plan:   Acute nonintractable headache, unspecified headache type; Sinus congestion Neuro exam is normal today However given her report of this being an atypical headache presentation for her, thunderclap onset, and reports that this is the worst headache of her life we are going to obtain stat head CT to rule out stroke or hemorrhage. Discussed that if results are negative, we will proceed with oral prednisone and Augmentin antibiotic as she is having significant sinus congestion and has bilateral middle ear effusions.  Per chart she has tolerated Rocephin in the past despite history of rash with ceftaroline. Worsening precautions advised in the interim regarding her headache.  Need for immunization against influenza Performed today  I,Havlyn C Ratchford,acting as a scribe for Inda Coke, PA.,have documented all relevant documentation on the behalf of Inda Coke, PA,as directed by  Inda Coke, PA while in the presence of Inda Coke, [place].   I, Inda Coke, [place], have reviewed all documentation for this visit. The documentation on 07/07/21 for the exam, diagnosis, procedures, and orders are all accurate and complete.  Inda Coke, PA-C

## 2021-07-07 NOTE — Patient Instructions (Addendum)
It was great to see you!  We are going to get a stat head CT today.  I will call with you results.  I'm going to go ahead and send in prednisone and antibiotic, however do not start this until after we have the results of your scan.  Headache Precautions: Contact a doctor if: Your symptoms are not helped by medicine. You have a headache that feels different than the other headaches. You feel sick to your stomach (nauseous) or you throw up (vomit). You have a fever. Get help right away if: Your headache gets very bad quickly. Your headache gets worse after a lot of physical activity. You keep throwing up. You have a stiff neck. You have trouble seeing. You have trouble speaking. You have pain in the eye or ear. Your muscles are weak or you lose muscle control. You lose your balance or have trouble walking. You feel like you will pass out (faint) or you pass out. You are mixed up (confused). You have a seizure.

## 2021-07-08 ENCOUNTER — Ambulatory Visit: Payer: 59 | Admitting: Orthopaedic Surgery

## 2021-07-08 ENCOUNTER — Ambulatory Visit
Admission: RE | Admit: 2021-07-08 | Discharge: 2021-07-08 | Disposition: A | Discharge status: Home / Self Care | Payer: 59 | Source: Ambulatory Visit | Attending: Obstetrics & Gynecology | Admitting: Obstetrics & Gynecology

## 2021-07-08 DIAGNOSIS — Z1231 Encounter for screening mammogram for malignant neoplasm of breast: Secondary | ICD-10-CM

## 2021-07-09 ENCOUNTER — Telehealth: Payer: Self-pay

## 2021-07-09 ENCOUNTER — Other Ambulatory Visit: Payer: Self-pay | Admitting: Endocrinology

## 2021-07-09 NOTE — Telephone Encounter (Signed)
Pt's husband called regarding a CT scan. It looks like there is an order put in on 9/27 by [facility]. Lylia would to hear back. Please Advise.

## 2021-07-10 ENCOUNTER — Encounter: Payer: 59 | Admitting: Family Medicine

## 2021-07-13 ENCOUNTER — Telehealth: Payer: Self-pay | Admitting: *Deleted

## 2021-07-13 ENCOUNTER — Ambulatory Visit (INDEPENDENT_AMBULATORY_CARE_PROVIDER_SITE_OTHER): Payer: 59 | Admitting: Physical Therapy

## 2021-07-13 ENCOUNTER — Encounter: Payer: Self-pay | Admitting: Physical Therapy

## 2021-07-13 ENCOUNTER — Telehealth: Payer: Self-pay | Admitting: Orthopaedic Surgery

## 2021-07-13 ENCOUNTER — Other Ambulatory Visit: Payer: Self-pay

## 2021-07-13 DIAGNOSIS — M6281 Muscle weakness (generalized): Secondary | ICD-10-CM | POA: Diagnosis not present

## 2021-07-13 DIAGNOSIS — R293 Abnormal posture: Secondary | ICD-10-CM

## 2021-07-13 DIAGNOSIS — M25512 Pain in left shoulder: Secondary | ICD-10-CM

## 2021-07-13 DIAGNOSIS — G8929 Other chronic pain: Secondary | ICD-10-CM

## 2021-07-13 DIAGNOSIS — M542 Cervicalgia: Secondary | ICD-10-CM

## 2021-07-13 DIAGNOSIS — M25511 Pain in right shoulder: Secondary | ICD-10-CM

## 2021-07-13 NOTE — Telephone Encounter (Signed)
Husband wanting to know if patient can start taking medication given for after her CT scan.   Patient has not had CT scan yet

## 2021-07-13 NOTE — Telephone Encounter (Signed)
[place] for patient to see Dr. Louanne Skye?

## 2021-07-13 NOTE — Therapy (Addendum)
[facility] Physical Therapy [address] [place], [name], [address] Phone: [phone]   Fax:  [phone]  Physical Therapy Treatment Progress Note/ Recert  Patient Details  Name: [name] MRN: [id number] Date of Birth: 04-01-1961 Referring Provider (PT): Benjiman Core, PA-C  Progress Note Reporting Period 06/01/2021 to 07/13/2021  See note below for Objective Data and Assessment of Progress/Goals.     Encounter Date: 07/13/2021   PT End of Session - 07/13/21 1031     Visit Number 4    Number of Visits 12    Date for PT Re-Evaluation 08/28/21    Authorization Type recert sent on 40/06/7352 until 08/28/2021,    Progress Note Due on Visit 10    PT Start Time 2992    PT Stop Time 1045    PT Time Calculation (min) 30 min    Activity Tolerance Patient tolerated treatment well    Behavior During Therapy [facility] for tasks assessed/performed             Past Medical History:  Diagnosis Date   Abnormal SPEP 04/17/2014   Acute left ankle pain 01/26/2017   ANEMIA-UNSPECIFIED 09/18/2009   Anxiety    Arthritis    Blood transfusion without reported diagnosis    Cataract    CHF (congestive heart failure) (HCC)    Chronic diastolic heart failure, NYHA class 2 (HCC)    Normal LVEDP by May 2018   COPD (chronic obstructive pulmonary disease) ([place])    Depression    DIABETES MELLITUS, TYPE II 08/21/2006   Diabetic osteomyelitis ([place]) 05/29/2015   Fatty liver    Fracture of 5th metatarsal    non union   GERD 42/68/3419   Had fundoplication   GOUT 62/22/9798   Hammer toe of right foot    2-5th toes   Hx of umbilical hernia repair    HYPERLIPIDEMIA 08/21/2006   HYPERTENSION 08/21/2006   Infection of wound due to methicillin resistant Staphylococcus aureus (MRSA)    Internal hemorrhoids    Kidney problem    Multiple allergies 10/14/2016   OBESITY 06/04/2009   Onychomycosis 10/27/2015   Osteomyelitis of left foot ([place]) 05/29/2015   Osteoporosis    Pulmonary  sarcoidosis ([place])    Followed locally by pulmonology, but also by Dr. Casper Harrison at [facility]   Right knee pain 01/26/2017   Vocal cord dysfunction    Wears partial dentures     Past Surgical History:  Procedure Laterality Date   ABDOMINAL HYSTERECTOMY     APPENDECTOMY     BLADDER SUSPENSION  11/11/2011   Procedure: TRANSVAGINAL TAPE (TVT) PROCEDURE;  Surgeon: Olga Millers, MD;  Location: [place] [facility];  Service: Gynecology;  Laterality: N/A;   CAROTIDS  02/18/11   CAROTID DUPLEX; VERTEBRALS ARE PATENT WITH ANTEGRADE FLOW. ICA/CCA RATIO 1.61 ON RIGHT AND 0.75 ON LEFT   CATARACT EXTRACTION, BILATERAL     CHOLECYSTECTOMY  1984   COLONOSCOPY  04/29/2010   [name]   CYSTOSCOPY  11/11/2011   Procedure: CYSTOSCOPY;  Surgeon: Olga Millers, MD;  Location: [place] [facility];  Service: Gynecology;  Laterality: N/A;   EXTUBATION (ENDOTRACHEAL) IN OR N/A 09/23/2017   Procedure: EXTUBATION (ENDOTRACHEAL) IN OR;  Surgeon: Helayne Seminole, MD;  Location: [place];  Service: ENT;  Laterality: N/A;   FIBEROPTIC LARYNGOSCOPY AND TRACHEOSCOPY N/A 09/23/2017   Procedure: FLEXIBLE FIBEROPTIC LARYNGOSCOPY;  Surgeon: Helayne Seminole, MD;  Location: [place];  Service: ENT;  Laterality: N/A;   FRACTURE SURGERY  foot   HALLUX FUSION Left 10/02/2018   Procedure: HALLUX ARTHRODESIS;  Surgeon: Edrick Kins, DPM;  Location: [facility];  Service: Podiatry;  Laterality: Left;   HAMMER TOE SURGERY Left 10/02/2018   Procedure: HAMMER TOE CORRECTION 2ND 3RD 4RD FIFTH TOE;  Surgeon: Edrick Kins, DPM;  Location: [facility];  Service: Podiatry;  Laterality: Left;   HAMMER TOE SURGERY Right 04/12/2019   Procedure: HAMMER TOE CORRECTION, 2ND, 3RD, 4TH AND 5TH TOES OF RIGHT FOOT;  Surgeon: Edrick Kins, DPM;  Location: [place];  Service: Podiatry;  Laterality: Right;   HAMMER TOE SURGERY Left 10/25/2019   Procedure: HAMMER TOE REPAIR SECOND, THIRD, FOUTH;  Surgeon: Edrick Kins, DPM;  Location: [place] [facility];  Service: Podiatry;   Laterality: Left;   HERNIA REPAIR     I & D EXTREMITY Left 06/27/2015   Procedure: Partial Excision Left Calcaneus, Place Antibiotic Beads, and Wound VAC;  Surgeon: Newt Minion, MD;  Location: [place];  Service: Orthopedics;  Laterality: Left;   JOINT REPLACEMENT     KNEE ARTHROSCOPY     right   LEFT AND RIGHT HEART CATHETERIZATION WITH CORONARY ANGIOGRAM N/A 04/23/2013   Procedure: LEFT AND RIGHT HEART CATHETERIZATION WITH CORONARY ANGIOGRAM;  Surgeon: [place]ie Man, MD;  Location: [facility] CATH LAB;  Service: Cardiovascular;  Laterality: N/A;   LEFT HEART CATH AND CORONARY ANGIOGRAPHY N/A 03/11/2017   Procedure: Left Heart Cath and Coronary Angiography;  Surgeon: Sherren Mocha, MD;  Location: [place] CV LAB; angiographically minimal CAD in the LAD otherwise normal.;  Normal LVEDP.  FALSE POSITIVE MYOVIEW   LEFT HEART CATH AND CORONARY ANGIOGRAPHY  07/20/2010   LVEF 50-55% WITH VERY MILD GLOBAL HYPOKINESIA; ESSENTIALLY NORMAL CORONARY ARTERIES; NORMAL LV FUNCTION   METATARSAL OSTEOTOMY WITH OPEN REDUCTION INTERNAL FIXATION (ORIF) METATARSAL WITH FUSION Left 04/09/2014   Procedure: LEFT FOOT FRACTURE OPEN TREATMENT METATARSAL INCLUDES INTERNAL FIXATION EACH;  Surgeon: Lorn Junes, MD;  Location: [place];  Service: Orthopedics;  Laterality: Left;   NISSEN FUNDOPLICATION  6063   NM MYOVIEW LTD --> FALSE POSITIVE  03/10/2017   : Moderate size "stress-induced "perfusion defect at the apex as well as "ill-defined stress-induced perfusion defect in the lateral wall.  EF 55%.  INTERMEDIATE risk. -->  FALSE POSITIVE   ORIF DISTAL FEMUR FRACTURE  08/08/2020   [facility]: ORIF of left extra-articular periprosthetic distal femur fracture with synthesis of the femur plate with cortical nonlocking screws.  (Thought to be pathologic fracture due to osteoporosis) -> after a fall   ORIF FEMUR W/ PERI-IMPLANT  08/29/2020   [facility]) revision of left femur fracture  ORIF plate fixation screws; culture positive for MSSA   Right and left CARDIAC CATHETERIZATION  04/23/2013   Angiographic normal coronaries; LVEDP 20 mmHg, PCWP 12-14 mmHg, RAP 12 mmHg.; Fick CO/CI 4.9/2.2   SHOULDER ARTHROSCOPY Left 03/14/2019   Procedure: LEFT SHOULDER ARTHROSCOPY, DEBRIDEMENT, AND DECOMPRESSION;  Surgeon: Newt Minion, MD;  Location: [place];  Service: Orthopedics;  Laterality: Left;   TOTAL KNEE ARTHROPLASTY Right 06/29/2017   Procedure: RIGHT TOTAL KNEE ARTHROPLASTY;  Surgeon: Newt Minion, MD;  Location: [place];  Service: Orthopedics;  Laterality: Right;   TOTAL KNEE ARTHROPLASTY Left 12/07/2017   TOTAL KNEE ARTHROPLASTY Left 12/07/2017   Procedure: LEFT TOTAL KNEE ARTHROPLASTY;  Surgeon: Newt Minion, MD;  Location: [place];  Service: Orthopedics;  Laterality: Left;   TRACHEOSTOMY TUBE PLACEMENT N/A 09/20/2017  Procedure: AWAKE INTUBATION WITH ANESTHESIA WITH VIDEO ASSISTANCE;  Surgeon: Helayne Seminole, MD;  Location: [place] [facility];  Service: ENT;  Laterality: N/A;   TRANSTHORACIC ECHOCARDIOGRAM  08/2014   Normal LV size and function.  Mild LVH.  EF 55-60%.  Normal regional wall motion.  GR 1 DD.  Normal RV size and function .   TRANSTHORACIC ECHOCARDIOGRAM  08/12/2020    [facility]): Normal LV size and function.  EF 55 to 60%.  No RWM A.  Normal filling pattern.  Normal RV.  No significant valvular disease-stenosis or regurgitation.  No vegetation.   TUBAL LIGATION     with reversal in [place]      There were no vitals filed for this visit.   Subjective Assessment - 07/13/21 1015     Subjective Pt arriving to therpay today reporting 5/10 pain in right shoulder. Pt still reporting tingling in her right shoulder.    Pertinent History closed fx left femur, CHF, COPD, anxiety, depression, DM, GERD, Gout, heperlipidemia, HTN, Osteoporosis, obesity, hallus fusion on left, left hammer toe, abdominal  hysterectomy, left arm fx due to fall    Currently in Pain? Yes    Pain Score 6     Pain Location Shoulder    Pain Orientation Right    Pain Descriptors / Indicators Aching;Tingling    Pain Type Acute pain    Pain Onset More than a month ago                [facility] PT Assessment - 07/13/21 0001       Assessment   Medical Diagnosis m54.2 neck pain    Referring Provider (PT) Benjiman Core, PA-C      Observation/Other Assessments   Focus on Therapeutic Outcomes (FOTO)  54% (predicted 60%)   taken at 4th visit     AROM   Overall AROM Comments cervical measurements in sitting, UE measurements in supine    Right Shoulder Flexion 154 Degrees   painful arc   Right Shoulder ABduction 130 Degrees   painful arc more at end ranges   Right Shoulder Internal Rotation 70 Degrees   shoulder 45 degrees   Right Shoulder External Rotation 70 Degrees   shoulder abd 45 degrees   Cervical Flexion 40    Cervical Extension 22    Cervical - Right Side Bend 16    Cervical - Left Side Bend 18    Cervical - Right Rotation 50    Cervical - Left Rotation 40      Strength   Overall Strength Comments grossly 4/5 in bialteral UE's                           [facility] Adult PT Treatment/Exercise - 07/13/21 0001       Exercises   Exercises Neck      Neck Exercises: Theraband   Scapula Retraction 10 reps      Neck Exercises: Standing   Other Standing Exercises row with green theraband x 15, shoulder extension x 15 green theraband   3 second holds with each rep     Neck Exercises: Stretches   Upper Trapezius Stretch Right;Left;2 reps;10 seconds    Upper Trapezius Stretch Limitations with added STM mobs to elongate muscle fibers    Levator Stretch 2 reps;10 seconds      Modalities   Modalities Traction  Traction   Type of Traction Cervical    Min (lbs) 7    Max (lbs) 15    Hold Time cervical protocol    Rest Time cervical protocol    Time 20 minutes total                        PT Short Term Goals - 06/29/21 1025       PT SHORT TERM GOAL #1   Title Pt to be independent with initial HEP    Status Achieved      PT SHORT TERM GOAL #2   Title Pt will report pain </= 5/10 with reaching overhead for donning shirts.    Baseline Pt stating pain of 4-5/10 when donning her shirt    Time 4    Period Weeks    Status Achieved    Target Date 07/03/21               PT Long Term Goals - 07/13/21 1041       PT LONG TERM GOAL #1   Title Pt to be independent with advanced HEP.    Time 6    Status On-going    Target Date 08/28/21      PT LONG TERM GOAL #2   Title Pt will improve her FOTO from 46% to >/= 60 %.    Baseline 46% on 06/01/2021, 54% on 07/13/2021    Time 6    Target Date 08/28/21      PT LONG TERM GOAL #3   Title Pt will improve her bilateral cervical rotation to >/= 60 degrees with pain</= 2/10.    Baseline see flowsheets    Time 6    Period Weeks    Status On-going    Target Date 08/28/21      PT LONG TERM GOAL #4   Title Pt will be able to imporve bilateral shoulder flexion to >/= 160 degrees with pain </= 2/10.    Time 6    Period Weeks    Status On-going      PT LONG TERM GOAL #5   Title Pt will be able to perform lifting 10# object onto overhead shelf using bilateral UE"s using correct body mechanics with pain </= 2/10.    Baseline Pt reporting able to lift 10# object, but pain reported as 5-6/10.    Time 6    Period Weeks    Status On-going    Target Date 08/28/21                   Plan - 07/13/21 1033     Clinical Impression Statement Pt has made progress with her cervical AROM. Pt still showing signs of limitations in her right shoulder movement and still limited by pain. Pt also still reporting tingling down right UE down to her finger tips. Mechanical traction tried today with good response. PT's FOTO score increased to 54% from 46%. Continue skilled PT as pt tolerates.    Personal Factors and  Comorbidities Comorbidity 3+    Comorbidities closed fx left femur, CHF, COPD, anxiety, depression, DM, GERD, Gout, heperlipidemia, HTN, Osteoporosis, obesity, hallus fusion on left, left hammer toe, abdominal hysterectomy    Examination-Activity Limitations Dressing;Lift;Carry;Other;Sleep    Examination-Participation Restrictions Cleaning;Community Activity;Other    Stability/Clinical Decision Making Stable/Uncomplicated    Rehab Potential Fair    PT Frequency 2x / week    PT Duration 12 weeks    PT  Treatment/Interventions ADLs/Self Care Home Management;Cryotherapy;Electrical Stimulation;Iontophoresis 4mg /ml Dexamethasone;Moist Heat;Traction;Ultrasound;Therapeutic exercise;Therapeutic activities;Functional mobility training;Neuromuscular re-education;Patient/family education;Passive range of motion;Dry needling;Taping;Manual techniques    PT Next Visit Plan response to  mechanical  traction, DN, cervical stretching, shoulder ROM/strengthening    PT Home Exercise Plan Access Code: [place] and Agree with Plan of Care Patient             Patient will benefit from skilled therapeutic intervention in order to improve the following deficits and impairments:  Pain, Postural dysfunction, Decreased strength, Decreased range of motion, Impaired UE functional use, Decreased activity tolerance, Impaired flexibility  Visit Diagnosis: Cervicalgia - Plan: PT plan of care cert/re-cert  Muscle weakness (generalized) - Plan: PT plan of care cert/re-cert  Chronic right shoulder pain - Plan: PT plan of care cert/re-cert  Acute pain of left shoulder - Plan: PT plan of care cert/re-cert  Abnormal posture - Plan: PT plan of care cert/re-cert     Problem List Patient Active Problem List   Diagnosis Date Noted   Facet arthropathy, cervical 06/29/2021   Foraminal stenosis of cervical region 06/29/2021   Class II obesity 02/02/2021   Iron deficiency anemia 10/01/2020   Postural  dizziness with presyncope 09/30/2020   Hypertriglyceridemia 04/02/2020   Aortic atherosclerosis (HCC) 02/14/2020   Nontraumatic complete tear of left rotator cuff    Diabetic neuropathy ([place]) 02/20/2019   Severe persistent asthma 01/04/2019   Subcortical microvascular ischemic occlusive disease 07/12/2018   Rapid palpitations 07/12/2018   Impingement of left ankle joint 06/26/2018   Constipation 04/21/2018   Total knee replacement status, left 12/07/2017   Dysphagia 09/20/2017   Epiglottitis    Plantar fasciitis, right 07/13/2017   S/P total knee arthroplasty, right 06/29/2017   Osteoarthrosis, localized, primary, knee, right    Sprain of calcaneofibular ligament of right ankle 06/06/2017   Osteoarthritis of right knee 01/26/2017   Onychomycosis 10/27/2015   CKD (chronic kidney disease) stage 3, GFR 30-59 ml/min (HCC) 04/27/2015   MRSA (methicillin resistant staph aureus) culture positive 03/27/2015   History of osteomyelitis 03/27/2015   Fatty liver 09/30/2014   Hot flashes 07/15/2014   Abnormal SPEP 04/17/2014   Fracture of left leg 04/17/2014   Cushingoid side effect of steroids ([place]) 04/17/2014   Internal hemorrhoids    Pulmonary sarcoidosis (HCC)    Major depression in full remission ([place])    Preoperative clearance 03/25/2014   Obstructive chronic bronchitis without exacerbation ([place]) 09/18/2013   Chest pain 04/11/2013   Hypertensive heart disease with chronic diastolic congestive heart failure ([place])    Solitary pulmonary nodule, on CT 02/2013 - stable over 2 years in 2015 02/20/2013   Gout 08/20/2010   Deficiency anemia 09/18/2009   Morbid obesity ([place]) 06/04/2009   Sleep apnea 04/21/2009   Sarcoidosis of lung ([place]) 04/10/2007   Hyperlipidemia associated with type 2 diabetes mellitus ([place]) 08/21/2006   Hypertension associated with diabetes ([place]) 08/21/2006   GERD 08/21/2006   Type II diabetes mellitus with renal manifestations ([place]) 08/21/2006    Oretha Caprice,  PT, MPT 07/13/2021, 11:03 AM  [facility] Physical Therapy [address] [place], [name], [address] Phone: [phone]   Fax:  [phone]  Name: [name] MRN: [id number] Date of Birth: 23-Nov-1960

## 2021-07-13 NOTE — Telephone Encounter (Signed)
Pt's husband  Rush Landmark called requesting for his wife to see Dr. Louanne Skye. [name] asking for a call back to set an appt if it's ok. Please call pt or husband at [phone].

## 2021-07-13 NOTE — Telephone Encounter (Signed)
Please see message and pt is scheduled to see Dr. Yong Channel on 10/11.

## 2021-07-13 NOTE — Telephone Encounter (Signed)
Please see below. Patient is requesting to see Dr. Louanne Skye.

## 2021-07-13 NOTE — Telephone Encounter (Signed)
Spoke to pt told her per Aldona Bar, Please tell patient that her CT scan was denied. Pt verbalized understanding. How is she feeling? Pt said she still has the headache. Told her she can start the antibiotic and the prednisone that I Aldona Bar has sent in for you. If symptoms persist, I'm going to order MRI of brain after discussing with Dr. Yong Channel about her case. Pt verbalized understanding.

## 2021-07-14 ENCOUNTER — Other Ambulatory Visit: Payer: Self-pay | Admitting: Physician Assistant

## 2021-07-14 ENCOUNTER — Ambulatory Visit (HOSPITAL_COMMUNITY)
Admission: RE | Admit: 2021-07-14 | Discharge: 2021-07-14 | Disposition: A | Discharge status: Home / Self Care | Payer: 59 | Source: Ambulatory Visit | Attending: Physician Assistant | Admitting: Physician Assistant

## 2021-07-14 DIAGNOSIS — R519 Headache, unspecified: Secondary | ICD-10-CM | POA: Insufficient documentation

## 2021-07-14 NOTE — Telephone Encounter (Signed)
I called and sched her for 08/20/21 @ 1030

## 2021-07-15 ENCOUNTER — Ambulatory Visit (HOSPITAL_COMMUNITY): Payer: 59

## 2021-07-16 ENCOUNTER — Other Ambulatory Visit: Payer: Self-pay | Admitting: Orthopaedic Surgery

## 2021-07-16 NOTE — Progress Notes (Signed)
Phone [phone]   Subjective:  Patient presents today for their annual physical. Chief complaint-noted.   See problem oriented charting- ROS- full  review of systems was completed and negative except for: joint pain, headaches- migraine  The following were reviewed and entered/updated in epic: Past Medical History:  Diagnosis Date   Abnormal SPEP 04/17/2014   Acute left ankle pain 01/26/2017   ANEMIA-UNSPECIFIED 09/18/2009   Anxiety    Arthritis    Blood transfusion without reported diagnosis    Cataract    CHF (congestive heart failure) (HCC)    Chronic diastolic heart failure, NYHA class 2 (HCC)    Normal LVEDP by May 2018   COPD (chronic obstructive pulmonary disease) ([place])    Depression    DIABETES MELLITUS, TYPE II 08/21/2006   Diabetic osteomyelitis ([place]) 05/29/2015   Fatty liver    Fracture of 5th metatarsal    non union   GERD 78/93/8101   Had fundoplication   GOUT 75/07/2584   Hammer toe of right foot    2-5th toes   Hx of umbilical hernia repair    HYPERLIPIDEMIA 08/21/2006   HYPERTENSION 08/21/2006   Infection of wound due to methicillin resistant Staphylococcus aureus (MRSA)    Internal hemorrhoids    Kidney problem    Multiple allergies 10/14/2016   OBESITY 06/04/2009   Onychomycosis 10/27/2015   Osteomyelitis of left foot ([place]) 05/29/2015   Osteoporosis    Pulmonary sarcoidosis ([place])    Followed locally by pulmonology, but also by Dr. Casper Harrison at [facility]   Right knee pain 01/26/2017   Vocal cord dysfunction    Wears partial dentures    Patient Active Problem List   Diagnosis Date Noted   Severe persistent asthma 01/04/2019    Priority: 1.   Subcortical microvascular ischemic occlusive disease 07/12/2018    Priority: 1.   Obstructive chronic bronchitis without exacerbation ([place]) 09/18/2013    Priority: 1.   Chest pain 04/11/2013    Priority: 1.   Hypertensive heart disease with chronic diastolic congestive heart failure ([place])      Priority: 1.   Sarcoidosis of lung ([place]) 04/10/2007    Priority: 1.   Type II diabetes mellitus with renal manifestations ([place]) 08/21/2006    Priority: 1.   Hypertriglyceridemia 04/02/2020    Priority: 2.   Aortic atherosclerosis ([place]) 02/14/2020    Priority: 2.   Constipation 04/21/2018    Priority: 2.   Epiglottitis     Priority: 2.   Osteoarthritis of right knee 01/26/2017    Priority: 2.   CKD (chronic kidney disease) stage 3, GFR 30-59 ml/min (HCC) 04/27/2015    Priority: 2.   History of osteomyelitis 03/27/2015    Priority: 2.   Fatty liver 09/30/2014    Priority: 2.   Major depression in full remission [facility])     Priority: 2.   Gout 08/20/2010    Priority: 2.   Deficiency anemia 09/18/2009    Priority: 2.   Sleep apnea 04/21/2009    Priority: 2.   Hyperlipidemia associated with type 2 diabetes mellitus ([place]) 08/21/2006    Priority: 2.   Hypertension associated with diabetes ([place]) 08/21/2006    Priority: 2.   Nontraumatic complete tear of left rotator cuff     Priority: 3.   Diabetic neuropathy ([place]) 02/20/2019    Priority: 3.   Rapid palpitations 07/12/2018    Priority: 3.   Impingement of left ankle joint 06/26/2018    Priority: 3.  Total knee replacement status, left 12/07/2017    Priority: 3.   Dysphagia 09/20/2017    Priority: 3.   Plantar fasciitis, right 07/13/2017    Priority: 3.   S/P total knee arthroplasty, right 06/29/2017    Priority: 3.   Osteoarthrosis, localized, primary, knee, right     Priority: 3.   Sprain of calcaneofibular ligament of right ankle 06/06/2017    Priority: 3.   Onychomycosis 10/27/2015    Priority: 3.   MRSA (methicillin resistant staph aureus) culture positive 03/27/2015    Priority: 3.   Hot flashes 07/15/2014    Priority: 3.   Abnormal SPEP 04/17/2014    Priority: 3.   Fracture of left leg 04/17/2014    Priority: 3.   Cushingoid side effect of steroids ([place]) 04/17/2014    Priority: 3.   Internal hemorrhoids      Priority: 3.   Preoperative clearance 03/25/2014    Priority: 3.   Solitary pulmonary nodule, on CT 02/2013 - stable over 2 years in 2015 02/20/2013    Priority: 3.   GERD 08/21/2006    Priority: 3.   Facet arthropathy, cervical 06/29/2021   Foraminal stenosis of cervical region 06/29/2021   Class II obesity 02/02/2021   Iron deficiency anemia 10/01/2020   Postural dizziness with presyncope 09/30/2020   Pulmonary sarcoidosis [facility])    Past Surgical History:  Procedure Laterality Date   ABDOMINAL HYSTERECTOMY     APPENDECTOMY     BLADDER SUSPENSION  11/11/2011   Procedure: TRANSVAGINAL TAPE (TVT) PROCEDURE;  Surgeon: Olga Millers, MD;  Location: [place] [facility];  Service: Gynecology;  Laterality: N/A;   CAROTIDS  02/18/11   CAROTID DUPLEX; VERTEBRALS ARE PATENT WITH ANTEGRADE FLOW. ICA/CCA RATIO 1.61 ON RIGHT AND 0.75 ON LEFT   CATARACT EXTRACTION, BILATERAL     CHOLECYSTECTOMY  1984   COLONOSCOPY  04/29/2010   [name]   CYSTOSCOPY  11/11/2011   Procedure: CYSTOSCOPY;  Surgeon: Olga Millers, MD;  Location: [place] [facility];  Service: Gynecology;  Laterality: N/A;   EXTUBATION (ENDOTRACHEAL) IN OR N/A 09/23/2017   Procedure: EXTUBATION (ENDOTRACHEAL) IN OR;  Surgeon: Helayne Seminole, MD;  Location: [place];  Service: ENT;  Laterality: N/A;   FIBEROPTIC LARYNGOSCOPY AND TRACHEOSCOPY N/A 09/23/2017   Procedure: FLEXIBLE FIBEROPTIC LARYNGOSCOPY;  Surgeon: Helayne Seminole, MD;  Location: [place];  Service: ENT;  Laterality: N/A;   FRACTURE SURGERY     foot   [place] Left 10/02/2018   Procedure: HALLUX ARTHRODESIS;  Surgeon: Edrick Kins, DPM;  Location: [facility];  Service: Podiatry;  Laterality: Left;   HAMMER TOE SURGERY Left 10/02/2018   Procedure: HAMMER TOE CORRECTION 2ND 3RD 4RD FIFTH TOE;  Surgeon: Edrick Kins, DPM;  Location: [facility];  Service: Podiatry;  Laterality: Left;   HAMMER TOE SURGERY Right 04/12/2019   Procedure: HAMMER TOE CORRECTION, 2ND, 3RD, 4TH AND 5TH TOES OF RIGHT  FOOT;  Surgeon: Edrick Kins, DPM;  Location: [place];  Service: Podiatry;  Laterality: Right;   HAMMER TOE SURGERY Left 10/25/2019   Procedure: HAMMER TOE REPAIR SECOND, THIRD, FOUTH;  Surgeon: Edrick Kins, DPM;  Location: [place] [facility];  Service: Podiatry;  Laterality: Left;   HERNIA REPAIR     I & D EXTREMITY Left 06/27/2015   Procedure: Partial Excision Left Calcaneus, Place Antibiotic Beads, and Wound VAC;  Surgeon: Newt Minion, MD;  Location: [place];  Service: Orthopedics;  Laterality: Left;   JOINT REPLACEMENT  KNEE ARTHROSCOPY     right   LEFT AND RIGHT HEART CATHETERIZATION WITH CORONARY ANGIOGRAM N/A 04/23/2013   Procedure: LEFT AND RIGHT HEART CATHETERIZATION WITH CORONARY ANGIOGRAM;  Surgeon: Leonie Man, MD;  Location: [facility] C[facility];  Service: Cardiovascular;  Laterality: N/A;   LEFT HEART CATH AND CORONARY ANGIOGRAPHY N/A 03/11/2017   Procedure: Left Heart Cath and Coronary Angiography;  Surgeon: Sherren Mocha, MD;  Location: [place] CV LAB; angiographically minimal CAD in the LAD otherwise normal.;  Normal LVEDP.  FALSE POSITIVE MYOVIEW   LEFT HEART CATH AND CORONARY ANGIOGRAPHY  07/20/2010   LVEF 50-55% WITH VERY MILD GLOBAL HYPOKINESIA; ESSENTIALLY NORMAL CORONARY ARTERIES; NORMAL LV FUNCTION   METATARSAL OSTEOTOMY WITH OPEN REDUCTION INTERNAL FIXATION (ORIF) METATARSAL WITH FUSION Left 04/09/2014   Procedure: LEFT FOOT FRACTURE OPEN TREATMENT METATARSAL INCLUDES INTERNAL FIXATION EACH;  Surgeon: Lorn Junes, MD;  Location: [place];  Service: Orthopedics;  Laterality: Left;   NISSEN FUNDOPLICATION  [id number]   NM MYOVIEW LTD --> FALSE POSITIVE  03/10/2017   : Moderate size "stress-induced "perfusion defect at the apex as well as "ill-defined stress-induced perfusion defect in the lateral wall.  EF 55%.  INTERMEDIATE risk. -->  FALSE POSITIVE   ORIF DISTAL FEMUR FRACTURE  08/08/2020   [facility]: ORIF of left extra-articular periprosthetic distal  femur fracture with synthesis of the femur plate with cortical nonlocking screws.  (Thought to be pathologic fracture due to osteoporosis) -> after a fall   ORIF FEMUR W/ PERI-IMPLANT  08/29/2020   [facility]) revision of left femur fracture ORIF plate fixation screws; culture positive for MSSA   Right and left CARDIAC CATHETERIZATION  04/23/2013   Angiographic normal coronaries; LVEDP 20 mmHg, PCWP 12-14 mmHg, RAP 12 mmHg.; Fick CO/CI 4.9/2.2   SHOULDER ARTHROSCOPY Left 03/14/2019   Procedure: LEFT SHOULDER ARTHROSCOPY, DEBRIDEMENT, AND DECOMPRESSION;  Surgeon: Newt Minion, MD;  Location: [place];  Service: Orthopedics;  Laterality: Left;   TOTAL KNEE ARTHROPLASTY Right 06/29/2017   Procedure: RIGHT TOTAL KNEE ARTHROPLASTY;  Surgeon: Newt Minion, MD;  Location: [place];  Service: Orthopedics;  Laterality: Right;   TOTAL KNEE ARTHROPLASTY Left 12/07/2017   TOTAL KNEE ARTHROPLASTY Left 12/07/2017   Procedure: LEFT TOTAL KNEE ARTHROPLASTY;  Surgeon: Newt Minion, MD;  Location: [place];  Service: Orthopedics;  Laterality: Left;   TRACHEOSTOMY TUBE PLACEMENT N/A 09/20/2017   Procedure: AWAKE INTUBATION WITH ANESTHESIA WITH VIDEO ASSISTANCE;  Surgeon: Helayne Seminole, MD;  Location: [place] [facility];  Service: ENT;  Laterality: N/A;   TRANSTHORACIC ECHOCARDIOGRAM  08/2014   Normal LV size and function.  Mild LVH.  EF 55-60%.  Normal regional wall motion.  GR 1 DD.  Normal RV size and function .   TRANSTHORACIC ECHOCARDIOGRAM  08/12/2020    [facility]): Normal LV size and function.  EF 55 to 60%.  No RWM A.  Normal filling pattern.  Normal RV.  No significant valvular disease-stenosis or regurgitation.  No vegetation.   TUBAL LIGATION     with reversal in 1994   UPPER GASTROINTESTINAL ENDOSCOPY     VENTRAL HERNIA REPAIR      Family History  Problem Relation Age of Onset   Diabetes Father    Heart attack Father 74   Coronary artery disease Father    Heart failure Father     Throat cancer Father    Hypertension Father    Heart disease Father  Sleep apnea Father    Obesity Father    COPD Mother    Emphysema Mother    Asthma Mother    Heart failure Mother    Breast cancer Mother    Diabetes Mother    Kidney disease Mother    Thyroid disease Mother    Heart attack Maternal Grandfather    Sarcoidosis Maternal Uncle    Lung cancer Brother    Diabetes Brother    Colon cancer Neg Hx    Colon polyps Neg Hx    Esophageal cancer Neg Hx    Rectal cancer Neg Hx    Stomach cancer Neg Hx     Medications- reviewed and updated Current Outpatient Medications  Medication Sig Dispense Refill   albuterol (PROVENTIL HFA;VENTOLIN HFA) 108 (90 Base) MCG/ACT inhaler Inhale 1-2 puffs into the lungs every 6 (six) hours as needed for wheezing or shortness of breath. 8 g 5   allopurinol (ZYLOPRIM) 300 MG tablet Take one tablet daily. 30 tablet 2   aspirin EC 81 MG tablet Take 1 tablet (81 mg total) by mouth daily. 30 tablet 11   atorvastatin (LIPITOR) 40 MG tablet TAKE 1 TABLET BY MOUTH EVERY DAY 90 tablet 3   BREO ELLIPTA 200-25 MCG/INH AEPB TAKE 1 PUFF BY MOUTH EVERY DAY 60 each 5   buPROPion (WELLBUTRIN XL) 300 MG 24 hr tablet TAKE 1 TABLET BY MOUTH EVERY DAY 90 tablet 3   carvedilol (COREG) 12.5 MG tablet Take 12.5 mg by mouth 2 (two) times daily with a meal.     Cholecalciferol 25 MCG (1000 UT) capsule Take 1,000 Units by mouth in the morning and at bedtime.     diclofenac Sodium (VOLTAREN) 1 % GEL Apply 1 application topically as needed (pain.).      diltiazem (CARDIZEM CD) 120 MG 24 hr capsule Take 1 capsule (120 mg total) by mouth daily. 90 capsule 3   famotidine (PEPCID) 20 MG tablet Take 1 tablet (20 mg total) by mouth at bedtime. 30 tablet 3   fenofibrate 160 MG tablet TAKE 1 TABLET BY MOUTH EVERY DAY 90 tablet 3   fluticasone (FLONASE) 50 MCG/ACT nasal spray Place 2 sprays into both nostrils daily as needed for allergies. 48 mL 3   furosemide (LASIX) 40 MG  tablet TAKE 1 TABLET BY MOUTH TWICE A DAY 180 tablet 1   gabapentin (NEURONTIN) 100 MG capsule TAKE 1 CAPSULE BY MOUTH THREE TIMES A DAY 270 capsule 3   glucose blood (CONTOUR NEXT TEST) test strip 1 each by Other route 4 (four) times daily. And lancets 4/day 400 each 3   insulin glargine (LANTUS SOLOSTAR) 100 UNIT/ML Solostar Pen Inject 22 Units into the skin every morning. 30 mL 2   insulin lispro (HUMALOG KWIKPEN) 100 UNIT/ML KwikPen Inject 0.15 mLs (15 Units total) into the skin daily with supper. 15 mL 29   Insulin Pen Needle (PEN NEEDLES) 32G X 4 MM MISC 1 each by Does not apply route 4 (four) times daily. E11.9 400 each 0   ipratropium (ATROVENT) 0.03 % nasal spray PLACE 2 SPRAYS INTO BOTH NOSTRILS EVERY 12 (TWELVE) HOURS. 30 mL 2   KLOR-CON M20 20 MEQ tablet TAKE 1 & 1/2 TABLETS BY MOUTH TWICE A DAY 270 tablet 2   lactulose (CHRONULAC) 10 GM/15ML solution TAKE 15 MLS BY MOUTH DAILY AS NEEDED FOR CONSTIPATION. 1419 mL 1   Lancet Devices (EASY MINI EJECT LANCING DEVICE) MISC Use as directed 4 times daily to test blood  sugar 1 each 3   linaclotide (LINZESS) 290 MCG CAPS capsule Take by mouth as needed.     LORazepam (ATIVAN) 0.5 MG tablet Take 0.5 mg by mouth every 12 (twelve) hours as needed for anxiety.     meloxicam (MOBIC) 15 MG tablet Take 1 tablet (15 mg total) by mouth daily. 30 tablet 1   metFORMIN (GLUCOPHAGE-XR) 500 MG 24 hr tablet Take 4 tablets (2,000 mg total) by mouth daily with breakfast. 360 tablet 3   methocarbamol (ROBAXIN) 500 MG tablet Take 1 tablet (500 mg total) by mouth every 8 (eight) hours as needed for muscle spasms. 40 tablet 0   Microlet Lancets MISC 1 each by Does not apply route 2 (two) times daily. E11.9 100 each 2   nitroGLYCERIN (NITROSTAT) 0.4 MG SL tablet Place 1 tablet (0.4 mg total) under the tongue every 5 (five) minutes as needed for chest pain. Reported on 01/05/2016 25 tablet 6   nystatin (MYCOSTATIN) 100000 UNIT/ML suspension nystatin 100,000 unit/mL  oral suspension  SWISH, GARGLE, AND SPIT 20 ML 4 TIMES A DAY UNTIL RESOLVED     ondansetron (ZOFRAN-ODT) 4 MG disintegrating tablet Take 1 tablet (4 mg total) by mouth every 8 (eight) hours as needed for nausea or vomiting. 50 tablet 2   telmisartan (MICARDIS) 20 MG tablet TAKE 1 TABLET BY MOUTH EVERY DAY 90 tablet 1   TRULICITY 3 BS/4.9QP SOPN INJECT 0.5 MLS (3 MG TOTAL) INTO THE SKIN ONCE A WEEK. 12 mL 3   venlafaxine XR (EFFEXOR-XR) 75 MG 24 hr capsule TAKE 1 CAPSULE BY MOUTH TWICE A DAY 180 capsule 3   RABEprazole (ACIPHEX) 20 MG tablet Take 1 tablet (20 mg total) by mouth daily. (Patient not taking: Reported on 07/21/2021) 90 tablet 3   Current Facility-Administered Medications  Medication Dose Route Frequency Provider Last Rate Last Admin   cyanocobalamin ((VITAMIN B-12)) injection 1,000 mcg  1,000 mcg Intramuscular Once [name], MD        Allergies-reviewed and updated Allergies  Allergen Reactions   Methotrexate Other (See Comments)    Peri-oral and buccal lesions   Vancomycin Other (See Comments)    DOSE RELATED NEPHROTOXICITY   Lisinopril Cough   Chlorhexidine Itching   Clindamycin/Lincomycin Nausea And Vomiting and Rash   Doxycycline Rash   Teflaro [Ceftaroline] Rash and Other (See Comments)    Tolerates ceftriaxone     Social History   Social History Narrative   Married 1994. 2 sons who both live close and 1 grandson.       Disability due to sarcoidosis. 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 Oropharynx normal Neck: no thyromegaly CV: RRR faint murmur noted- she is going to discuss  with cardiology- last echo 2015 Lungs: CTAB no crackles, wheeze, rhonchi Abdomen: soft/nontender/nondistended/normal bowel sounds. No rebound or guarding.  Ext: no edema Skin: warm, dry Neuro: grossly normal, moves all extremities, PERRLA   Assessment and Plan   60 y.o. female presenting for annual physical.  Health Maintenance counseling: 1. Anticipatory guidance:  Patient counseled regarding regular dental exams - not going q6 months - wears dentures, eye exams -yearly,  avoiding smoking and second hand smoke , limiting alcohol to 1 beverage per day - never , no illicit drugs.   2. Risk factor reduction:  Advised patient of need for regular exercise and diet rich and fruits and vegetables to reduce risk of heart attack and stroke. Exercise- [employer] down as family staying with him for 3 months and now with right neck/shoulder pain. Diet-high protein low carb diet - working with weight to wellness last year - down about 8 lbs over last year- working on her own now.  Wt Readings from Last 3 Encounters:  07/21/21 225 lb 6.4 oz (102.2 kg)  07/07/21 227 lb 9.6 oz (103.2 kg)  06/24/21 225 lb (102.1 kg)  3. Immunizations/screenings/ancillary studies- discussed Omicron/Bivalent booster-just had this before [place]ney trip!  - otherwise up-to-date. Immunization History  Administered Date(s) Administered   H1N1 09/24/2008   Hep A / Hep B 02/20/2018, 04/03/2018, 08/23/2018   Influenza Inj Mdck Quad With Preservative 07/12/2019   Influenza Split 09/24/2008, 07/07/2011, 07/18/2012, 06/12/2013, 07/15/2014, 08/08/2015, 06/22/2016, 07/01/2017, 08/16/2017, 07/14/2018, 07/04/2019, 07/12/2019, 07/26/2019, 07/02/2020   Influenza Whole 08/15/2007, 06/26/2008, 07/23/2009, 07/16/2010   Influenza, Seasonal, Injecte, Preservative Fre 10/26/2011, 07/18/2012   Influenza,inj,Quad PF,6+ Mos 06/12/2013, 07/15/2014, 08/08/2015, 06/22/2016, 07/01/2017, 07/14/2018, 07/04/2019, 07/02/2020, 07/07/2021    Influenza-Unspecified 09/24/2008, 07/26/2019   PFIZER Comirnaty(Gray Top)Covid-19 Tri-Sucrose Vaccine 04/15/2021   PFIZER(Purple Top)SARS-COV-2 Vaccination 12/26/2019, 01/16/2020, 04/15/2021, 07/08/2021   Pneumococcal Conjugate-13 05/06/2014   Pneumococcal Polysaccharide-23 09/30/2008, 02/13/2013   Td 05/21/2010   Tdap 05/21/2010, 07/02/2020   Zoster Recombinat (Shingrix) 03/02/2017, 05/02/2017  4. Cervical cancer screening- last done 10/17/2019 with a 3-year repeat planned with GYN- do not see actual report attached to HM- will have team investigate 5. Breast cancer screening-  breast exam with GYN and mammogram last done on 07/08/2021 with a 1-year repeat planned- due now recommended update 6. Colon cancer screening - last done 01/06/2021 with a 7-year repeat planned 7. Skin cancer screening- No dermatologist. advised regular sunscreen use. Denies worrisome, changing, or new skin lesions.  8. Birth control/STD check- postmenopausal and monogamous plus hysterectomy 9. Osteoporosis screening at 33- last done on 11/01/19- normal - consider repeat in 2-3 years on prednisone 10. Smoking associated screening - never smoker  Status of chronic or acute concerns   #Cervicalgia-working with physical therapy right now-reports ongoing pain in the right neck/arm- tingling in fingers.  Has upcoming appointment with orthopedics again next month  #Severe headache-plan was for head CT to rule out stroke or hemorrhage at 07/07/2021 visit.  Plan was for prednisone and Augmentin for sinus congestion and bilateral middle ear effusions if CT reassuring.  Ended up doing MRI instead due to insurance issues. -finishing augmentin that has helped some -overall headaches have improved from peak but still having headaches 2-3 a week. Tylenol does not help. Neck could be triggering this. Not necessarily worse when neck bothering her. Had MRI in 2009 for headaches.  -hurts from front of head to back of head on both sides.  Nagging/constant does not pulse. Sick on stomach/nausea for a few mins. Lasts 15-20 mins. No blurry vision. Sugar and BP have been normal with this.  -does not sound like migraines to me and reassuring neuroimaging- would like to see how she does as neck progresses- suspect could be related to that. For now continue tylenol/rest when occurs ideally twice a week maxiumum  # Depression/hot flashes S:  Compliant with Wellbutrin 300 XR daily, Effexor xr 75 mg twice daily, and paxil 7.5 mg (for hot flashes).  Depression screen [facility] 2/9 07/21/2021 02/09/2021 01/01/2021  Decreased Interest 0 0 0  Down, Depressed, Hopeless 0 0 0  PHQ - 2 Score 0 0 0  Altered sleeping 0 - 0  Tired, decreased energy 0 - 3  Change in appetite 0 - 0  Feeling bad or failure about yourself  0 - 0  Trouble concentrating 0 - 0  Moving slowly or fidgety/restless 0 - 0  Suicidal thoughts 0 - 0  PHQ-9 Score 0 - 3  Difficult doing work/chores Not difficult at all - Somewhat difficult  Some recent data might be hidden  A/P: Full remission-PHQ 2 of 0- reasonably well-controlled-continue current medication  # hypertension/hypertensive heart disease with chronic diastolic CHF/CKD stage III S: compliant with Lasix 40 mg daily, diltiazem 120 mg extended release daily, carvedilol 12.5 mg BID, telmisartan 20 mg daily, imdur 30 mg  Renal function stable with GFR of 52 on last check BP Readings from Last 3 Encounters:  07/21/21 138/72  07/07/21 140/62  06/24/21 (!) 150/77  A/P: Hypertension stable on repeat today-asked her to do some home monitoring.  No recent weight gain or worsening of swelling in the legs in regards to CHF.  Chronic kidney disease stage III has been stable.  Continue current medications for all 3 issues -On meloxicam per orthopedics.  We discussed monitoring this closely to make sure renal function does not worsen-update with labs today- ideally would be off this asap  #did have UTi- seeing Dr. Shirley Muscat- off abx-  now on high dose cranberry pill. Has refill available from him  # hyperlipidemia S:compliant with atorvastatin 40 mg daily, fenofibrate 160 mg daily Despite this triglycerides remain high and she was started on Vascepa by cardiology   she also takes aspirin for primary prevention Lab Results  Component Value Date   CHOL 162 03/31/2020   HDL 40 03/31/2020   LDLCALC 84 03/31/2020   LDLDIRECT 68.0 01/22/2020   TRIG 227 (H) 03/31/2020   CHOLHDL 5 01/22/2020  A/P: Hopefully improve-update lipid panel today  #Sarcoidosis of lungs-follows at [facility] and was on long term prednisone/Obstructive chronic bronchitis without exacerbation /Severe persistent asthma without complication S:  patient compliant with breo 200-10m 1 puff every day- stable. Has been able to stay off prednisone thankfully  Can get into some tough coughing spells when ill and requests tussionex generally at those times. Also uses flonase for allergies  A/P: overall respiratory issues stable- continue current meds  #Type 2 diabetes mellitus/morbid obesity #Diabetic polyneuropathy associated with type 2 diabetes mellitus #fatty liver - patient compliant with gabapentin 100 mg 3x a day for neuropathy. Status stable. Plan continue rx - follows with endocrinology and on insulin with Dr. ELoanne Drillingand also on metformin. Prior Chronic steroids contribute to sugar dysregulation -too soon for A1c repeat today- stable last check Lab Results  Component Value Date   HGBA1C 6.7 (A) 04/29/2021   HGBA1C 6.0 (A) 09/12/2020   HGBA1C 7.9 08/08/2020  -LFTs have been normal most recently- will continue to trend  Hepatic Function Latest Ref Rng & Units 05/04/2021 02/02/2021 12/24/2020  Total Protein 6.5 - 8.1 g/dL 6.8 7.0 6.5  Albumin 3.5 - 5.0 g/dL 3.8 3.9 4.0  AST 15 - 41 U/L 24 21 18   ALT 0 - 44 U/L 21 16 21   Alk Phosphatase 38 - 126 U/L 80 81 95  Total Bilirubin 0.3 -  1.2 mg/dL 0.5 0.5 0.6  Bilirubin, Direct 0.0 - 0.2 mg/dL - - -     #Gout # arthritic issues- s/p left total knee replacement. ongoign right knee OA S: no flares in 6 months at least (they thought neck could be related) on allopurinol 100 mg daily- working up to 339m due to high - now on 300 Lab Results  Component Value Date   LABURIC 7.6 (H) 05/27/2021  A/P:gout-hopefully uric acid level is improved with goal of 6 or less-update now on allopurinol 300 mg.  # Cushingoid side effect of steroids- noted. 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 Noted - trend cbc. Has seen oncology in past for positive SPEP but ultimately thought related to sarcoid . iron infusions at times with hematology/ferritin adequate on last check- check today Lab Results  Component Value Date   FERRITIN 199 05/04/2021   #B12 deficiency S: discovered by SInda CokePA <200. doing once weekly injections for 6 weeks and then once a month after that Lab Results  Component Value Date   VITAMINB12 445 04/22/2021  A/P:labs today then can get b12 injectoin  Recommended follow up: Return in about 4 months (around 11/21/2021) for a follow-up or sooner if needed. Future Appointments  Date Time Provider [place] 07/24/2021  2:00 PM YMarybelle Killings MD [facility] None  07/27/2021 10:15 AM MOretha Caprice PT [facility] None  07/30/2021  8:00 AM ERenato Shin MD [facility]END[facility] None  08/20/2021 10:30 AM NJessy Oto MD [facility] None  11/05/2021 11:45 AM [facility] LAB [facility] None  11/05/2021 12:20 PM GHeath Lark MD [facility] None  11/05/2021  1:30 PM [facility] INFUSION [facility] None  12/07/2021  8:20 AM HLeonie Man MD [facility] 02/15/2022  9:30 AM [facility] HEALTH COACH [facility] PEC   Lab/Order associations: fasting   ICD-10-CM   1. Preventative health care  Z00.00     2. Recurrent  major depressive disorder, in full remission ([place]  F33.42     3. Type 2 diabetes mellitus with other diabetic kidney complication, with long-term current use of insulin (HCC)  E11.29    Z79.4     4. Hypertension associated with diabetes ([place]  E11.59    I15.2     5. Hyperlipidemia associated with type 2 diabetes mellitus (HCC)  E11.69 CBC with Differential/Platelet   E78.5 Comprehensive metabolic panel    Lipid panel    6. 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 B12 deficiency  E53.8 Vitamin B12    8. Iron deficiency anemia, unspecified iron deficiency anemia type  D50.9 Ferritin      No orders of the defined types were placed in this encounter.  I,Harris Phan,acting as a sEducation administratorfor SGarret Reddish MD.,have documented all relevant documentation on the behalf of SGarret Reddish MD,as directed by  SGarret Reddish MD while in the presence of SGarret Reddish MD.  I, SGarret Reddish MD, have reviewed all documentation for this visit. The documentation on 07/21/21 for the exam, diagnosis, procedures, and orders are all accurate and complete.  Return precautions advised.  SGarret Reddish MD

## 2021-07-16 NOTE — Telephone Encounter (Signed)
Please advise 

## 2021-07-16 NOTE — Patient Instructions (Addendum)
Please stop by lab before you go If you have mychart- we will send your results within 3 business days of [place] receiving them.  If you do not have mychart- we will call you about results within 5 business days of [place] receiving them.  *please also note that you will see labs on mychart as soon as they post. I will later go in and write notes on them- will say "notes from Dr. Yong Channel"  B-12 injection today after getting labs done.  In regards to your headaches, I want to see what the outcome will be as your neck progresses/resolves - I suspect that it could be related. You may continue getting rest and taking Tylenol- ideally take this medication no more than twice a week to avoid stressing the liver. Let me know if headaches do not improve after neck is doing better  Team please investigate in as to why patient's PAP Smear record is not attached to her health maintenance.  You blood pressure today was high-normal. Please do some home monitoring - goal at home is to ideally be <135/85 on average.  We will monitor your renal function since you are on Meloxicam. Ideally I would like for you to be off of this medication asap.   Recommended follow up: Return in about 4 months (around 11/21/2021) for a follow-up or sooner if needed.

## 2021-07-20 ENCOUNTER — Encounter: Payer: Self-pay | Admitting: Physical Therapy

## 2021-07-20 ENCOUNTER — Other Ambulatory Visit: Payer: Self-pay

## 2021-07-20 ENCOUNTER — Ambulatory Visit (INDEPENDENT_AMBULATORY_CARE_PROVIDER_SITE_OTHER): Payer: 59 | Admitting: Physical Therapy

## 2021-07-20 DIAGNOSIS — M25512 Pain in left shoulder: Secondary | ICD-10-CM | POA: Diagnosis not present

## 2021-07-20 DIAGNOSIS — M542 Cervicalgia: Secondary | ICD-10-CM | POA: Diagnosis not present

## 2021-07-20 DIAGNOSIS — R293 Abnormal posture: Secondary | ICD-10-CM

## 2021-07-20 DIAGNOSIS — M25511 Pain in right shoulder: Secondary | ICD-10-CM | POA: Diagnosis not present

## 2021-07-20 DIAGNOSIS — M6281 Muscle weakness (generalized): Secondary | ICD-10-CM

## 2021-07-20 DIAGNOSIS — G8929 Other chronic pain: Secondary | ICD-10-CM

## 2021-07-20 NOTE — Therapy (Addendum)
[place], [name], [address] Phone: [phone]   Fax:  [phone]  Physical Therapy Treatment Discharge  Patient Details  Name: [name] MRN: [id number] Date of Birth: 13-Dec-1960 Referring Provider (PT): Benjiman Core, [place]   Encounter Date: 07/20/2021   PT End of Session - 07/20/21 1020     Visit Number 5    Number of Visits 12    Date for PT Re-Evaluation 08/28/21    Authorization Type recert sent on 92/0/1007 until 08/28/2021,    Progress Note Due on Visit 10    PT Start Time 1010    PT Stop Time 1219    PT Time Calculation (min) 35 min    Activity Tolerance Patient tolerated treatment well    Behavior During Therapy [facility] for tasks assessed/performed             Past Medical History:  Diagnosis Date   Abnormal SPEP 04/17/2014   Acute left ankle pain 01/26/2017   ANEMIA-UNSPECIFIED 09/18/2009   Anxiety    Arthritis    Blood transfusion without reported diagnosis    Cataract    CHF (congestive heart failure) (HCC)    Chronic diastolic heart failure, NYHA class 2 ([place])    Normal LVEDP by May 2018   COPD (chronic obstructive pulmonary disease) ([place])    Depression    DIABETES MELLITUS, TYPE II 08/21/2006   Diabetic osteomyelitis ([place]) 05/29/2015   Fatty liver    Fracture of 5th metatarsal    non union   GERD 75/88/3254   Had fundoplication   GOUT 98/26/4158   Hammer toe of right foot    2-5th toes   Hx of umbilical hernia repair    HYPERLIPIDEMIA 08/21/2006   HYPERTENSION 08/21/2006   Infection of wound due to methicillin resistant Staphylococcus aureus (MRSA)    Internal hemorrhoids    Kidney problem    Multiple allergies 10/14/2016   OBESITY 06/04/2009   Onychomycosis 10/27/2015   Osteomyelitis of left foot ([place]) 05/29/2015   Osteoporosis    Pulmonary sarcoidosis ([place])    Followed locally by pulmonology, but also by Dr. Casper Harrison at [facility]   Right knee pain 01/26/2017    Vocal cord dysfunction    Wears partial dentures     Past Surgical History:  Procedure Laterality Date   ABDOMINAL HYSTERECTOMY     APPENDECTOMY     BLADDER SUSPENSION  11/11/2011   Procedure: TRANSVAGINAL TAPE (TVT) PROCEDURE;  Surgeon: Olga Millers, MD;  Location: [facility];  Service: Gynecology;  Laterality: N/A;   CAROTIDS  02/18/11   CAROTID DUPLEX; VERTEBRALS ARE PATENT WITH ANTEGRADE FLOW. ICA/CCA RATIO 1.61 ON RIGHT AND 0.75 ON LEFT   CATARACT EXTRACTION, BILATERAL     CHOLECYSTECTOMY  1984   COLONOSCOPY  04/29/2010   [name]   CYSTOSCOPY  11/11/2011   Procedure: CYSTOSCOPY;  Surgeon: Olga Millers, MD;  Location: [facility];  Service: Gynecology;  Laterality: N/A;   EXTUBATION (ENDOTRACHEAL) IN OR N/A 09/23/2017   Procedure: EXTUBATION (ENDOTRACHEAL) IN OR;  Surgeon: Helayne Seminole, MD;  Location: [place];  Service: ENT;  Laterality: N/A;   FIBEROPTIC LARYNGOSCOPY AND TRACHEOSCOPY N/A 09/23/2017   Procedure: FLEXIBLE FIBEROPTIC LARYNGOSCOPY;  Surgeon: Helayne Seminole, MD;  Location: [place];  Service: ENT;  Laterality: N/A;   FRACTURE SURGERY     foot   HALLUX FUSION Left 10/02/2018   Procedure: HALLUX ARTHRODESIS;  Surgeon: Edrick Kins, DPM;  Location[facility];  Service: Podiatry;  Laterality: Left;   HAMMER TOE SURGERY Left 10/02/2018   Procedure: HAMMER TOE CORRECTION 2ND 3RD 4RD FIFTH TOE;  Surgeon: Edrick Kins, DPM;  Location: [facility];  Service: Podiatry;  Laterality: Left;   HAMMER TOE SURGERY Right 04/12/2019   Procedure: HAMMER TOE CORRECTION, 2ND, 3RD, 4TH AND 5TH TOES OF RIGHT FOOT;  Surgeon: Edrick Kins, DPM;  Location: [place];  Service: Podiatry;  Laterality: Right;   HAMMER TOE SURGERY Left 10/25/2019   Procedure: HAMMER TOE REPAIR SECOND, THIRD, FOUTH;  Surgeon: Edrick Kins, DPM;  Location: [place] [facility];  Service: Podiatry;  Laterality: Left;   HERNIA REPAIR     I & D EXTREMITY Left 06/27/2015   Procedure: Partial Excision Left Calcaneus, Place Antibiotic Beads,  and Wound VAC;  Surgeon: Newt Minion, MD;  Location: [place];  Service: Orthopedics;  Laterality: Left;   JOINT REPLACEMENT     KNEE ARTHROSCOPY     right   LEFT AND RIGHT HEART CATHETERIZATION WITH CORONARY ANGIOGRAM N/A 04/23/2013   Procedure: LEFT AND RIGHT HEART CATHETERIZATION WITH CORONARY ANGIOGRAM;  Surgeon: Leonie Man, MD;  Location: [facility] CATH LAB;  Service: Cardiovascular;  Laterality: N/A;   LEFT HEART CATH AND CORONARY ANGIOGRAPHY N/A 03/11/2017   Procedure: Left Heart Cath and Coronary Angiography;  Surgeon: Sherren Mocha, MD;  Location: [place] CV LAB; angiographically minimal CAD in the LAD otherwise normal.;  Normal LVEDP.  FALSE POSITIVE MYOVIEW   LEFT HEART CATH AND CORONARY ANGIOGRAPHY  07/20/2010   LVEF 50-55% WITH VERY MILD GLOBAL HYPOKINESIA; ESSENTIALLY NORMAL CORONARY ARTERIES; NORMAL LV FUNCTION   METATARSAL OSTEOTOMY WITH OPEN REDUCTION INTERNAL FIXATION (ORIF) METATARSAL WITH FUSION Left 04/09/2014   Procedure: LEFT FOOT FRACTURE OPEN TREATMENT METATARSAL INCLUDES INTERNAL FIXATION EACH;  Surgeon: Lorn Junes, MD;  Location: [place];  Service: Orthopedics;  Laterality: Left;   NISSEN FUNDOPLICATION  2671   NM MYOVIEW LTD --> FALSE POSITIVE  03/10/2017   : Moderate size "stress-induced "perfusion defect at the apex as well as "ill-defined stress-induced perfusion defect in the lateral wall.  EF 55%.  INTERMEDIATE risk. -->  FALSE POSITIVE   ORIF DISTAL FEMUR FRACTURE  08/08/2020   [facility]: ORIF of left extra-articular periprosthetic distal femur fracture with synthesis of the femur plate with cortical nonlocking screws.  (Thought to be pathologic fracture due to osteoporosis) -> after a fall   ORIF FEMUR W/ PERI-IMPLANT  08/29/2020   [facility]) revision of left femur fracture ORIF plate fixation screws; culture positive for MSSA   Right and left CARDIAC CATHETERIZATION  04/23/2013   Angiographic normal coronaries;  LVEDP 20 mmHg, PCWP 12-14 mmHg, RAP 12 mmHg.; Fick CO/CI 4.9/2.2   SHOULDER ARTHROSCOPY Left 03/14/2019   Procedure: LEFT SHOULDER ARTHROSCOPY, DEBRIDEMENT, AND DECOMPRESSION;  Surgeon: Newt Minion, MD;  Location: [place];  Service: Orthopedics;  Laterality: Left;   TOTAL KNEE ARTHROPLASTY Right 06/29/2017   Procedure: RIGHT TOTAL KNEE ARTHROPLASTY;  Surgeon: Newt Minion, MD;  Location: [place];  Service: Orthopedics;  Laterality: Right;   TOTAL KNEE ARTHROPLASTY Left 12/07/2017   TOTAL KNEE ARTHROPLASTY Left 12/07/2017   Procedure: LEFT TOTAL KNEE ARTHROPLASTY;  Surgeon: Newt Minion, MD;  Location: [place];  Service: Orthopedics;  Laterality: Left;   TRACHEOSTOMY TUBE PLACEMENT N/A 09/20/2017   Procedure: AWAKE INTUBATION WITH ANESTHESIA WITH VIDEO ASSISTANCE;  Surgeon: Helayne Seminole, MD;  Location: [place];  Service: ENT;  Laterality: N/A;   TRANSTHORACIC ECHOCARDIOGRAM  08/2014   Normal LV size and function.  Mild LVH.  EF 55-60%.  Normal regional wall motion.  GR 1 DD.  Normal RV size and function .   TRANSTHORACIC ECHOCARDIOGRAM  08/12/2020    [facility]): Normal LV size and function.  EF 55 to 60%.  No RWM A.  Normal filling pattern.  Normal RV.  No significant valvular disease-stenosis or regurgitation.  No vegetation.   TUBAL LIGATION     with reversal in [place]      There were no vitals filed for this visit.   Subjective Assessment - 07/20/21 1009     Subjective Pt arriving today reporting 7/10 pain in right shoulder and down right arm into finger tips. Pt reporting good response to mechanical traction and wishes to try it again.    Pertinent History closed fx left femur, CHF, COPD, anxiety, depression, DM, GERD, Gout, heperlipidemia, HTN, Osteoporosis, obesity, hallus fusion on left, left hammer toe, abdominal hysterectomy, left arm fx due to fall    Patient Stated Goals Feel better before going to [facility]     Currently in Pain? Yes    Pain Score 8     Pain Location Shoulder    Pain Orientation Right    Pain Descriptors / Indicators Aching;Tingling    Pain Type Acute pain    Pain Onset More than a month ago    Pain Frequency Intermittent                               [facility] Adult PT Treatment/Exercise - 07/20/21 0001       Exercises   Exercises Neck      Neck Exercises: Machines for Strengthening   UBE (Upper Arm Bike) L2 x 6 minutes (3 forward and 3 back)      Neck Exercises: Theraband   Shoulder Extension 20 reps;Green    Rows 20 reps;Green;Other (comment)    Rows Limitations instructions not to shoulder hike      Neck Exercises: Standing   Other Standing Exercises --   3 second holds with each rep   Other Standing Exercises wall angels x 10, limited ER and shoulder flexion      Neck Exercises: Stretches   Upper Trapezius Stretch Right;Left;2 reps;10 seconds      Modalities   Modalities Traction      Traction   Type of Traction Cervical    Min (lbs) 10    Max (lbs) 20    Hold Time cervical protocol    Rest Time cervical protol    Time 20 minutes                     PT Education - 07/20/21 1016     Education Details Discussed traction response and increased pull to hlep with radicular symptoms    Person(s) Educated Patient    Methods Explanation    Comprehension Verbalized understanding              PT Short Term Goals - 06/29/21 1025       PT SHORT TERM GOAL #1   Title Pt to be independent with initial HEP    Status Achieved      PT SHORT TERM GOAL #2   Title Pt will report pain </=  5/10 with reaching overhead for donning shirts.    Baseline Pt stating pain of 4-5/10 when donning her shirt    Time 4    Period Weeks    Status Achieved    Target Date 07/03/21               PT Long Term Goals - 07/20/21 1038       PT LONG TERM GOAL #1   Title Pt to be independent with advanced HEP.    Status On-going       PT LONG TERM GOAL #2   Title Pt will improve her FOTO from 46% to >/= 60 %.    Baseline 46% on 06/01/2021, 54% on 07/13/2021    Status On-going      PT LONG TERM GOAL #3   Title Pt will improve her bilateral cervical rotation to >/= 60 degrees with pain</= 2/10.    Status On-going      PT LONG TERM GOAL #4   Title Pt will be able to imporve bilateral shoulder flexion to >/= 160 degrees with pain </= 2/10.    Status On-going      PT LONG TERM GOAL #5   Title Pt will be able to perform lifting 10# object onto overhead shelf using bilateral UE"s using correct body mechanics with pain </= 2/10.    Baseline Pt reporting able to lift 10# object, but pain reported as 5-6/10.    Status On-going                   Plan - 07/20/21 1021     Clinical Impression Statement Pt with good response to mechanical traction last visit. Traction performed again today following therapeautic exercises with incresaed weight pull. Pt tolerating well reporting improved pain and function. Continue skilled PT to maximize function.    Personal Factors and Comorbidities Comorbidity 3+    Comorbidities closed fx left femur, CHF, COPD, anxiety, depression, DM, GERD, Gout, heperlipidemia, HTN, Osteoporosis, obesity, hallus fusion on left, left hammer toe, abdominal hysterectomy    Examination-Activity Limitations Dressing;Lift;Carry;Other;Sleep    Examination-Participation Restrictions Cleaning;Community Activity;Other    Stability/Clinical Decision Making Stable/Uncomplicated    Rehab Potential Fair    PT Frequency 2x / week    PT Duration 12 weeks    PT Treatment/Interventions ADLs/Self Care Home Management;Cryotherapy;Electrical Stimulation;Iontophoresis 96m/ml Dexamethasone;Moist Heat;Traction;Ultrasound;Therapeutic exercise;Therapeutic activities;Functional mobility training;Neuromuscular re-education;Patient/family education;Passive range of motion;Dry needling;Taping;Manual techniques    PT Next Visit  Plan response to incresaed mechanical  traction, postural exercises, cervical stretching, shoulder ROM/strengthening    PT Home Exercise Plan Access Code: [email or web address]    Consulted and Agree with Plan of Care Patient             Patient will benefit from skilled therapeutic intervention in order to improve the following deficits and impairments:  Pain, Postural dysfunction, Decreased strength, Decreased range of motion, Impaired UE functional use, Decreased activity tolerance, Impaired flexibility  Visit Diagnosis: Cervicalgia  Muscle weakness (generalized)  Chronic right shoulder pain  Acute pain of left shoulder  Abnormal posture     Problem List Patient Active Problem List   Diagnosis Date Noted   Facet arthropathy, cervical 06/29/2021   Foraminal stenosis of cervical region 06/29/2021   Class II obesity 02/02/2021   Iron deficiency anemia 10/01/2020   Postural dizziness with presyncope 09/30/2020   Hypertriglyceridemia 04/02/2020   Aortic atherosclerosis ([place] 02/14/2020   Nontraumatic complete tear of left rotator cuff    Diabetic neuropathy (HCC)  02/20/2019   Severe persistent asthma 01/04/2019   Subcortical microvascular ischemic occlusive disease 07/12/2018   Rapid palpitations 07/12/2018   Impingement of left ankle joint 06/26/2018   Constipation 04/21/2018   Total knee replacement status, left 12/07/2017   Dysphagia 09/20/2017   Epiglottitis    Plantar fasciitis, right 07/13/2017   S/P total knee arthroplasty, right 06/29/2017   Osteoarthrosis, localized, primary, knee, right    Sprain of calcaneofibular ligament of right ankle 06/06/2017   Osteoarthritis of right knee 01/26/2017   Onychomycosis 10/27/2015   CKD (chronic kidney disease) stage 3, GFR 30-59 ml/min (HCC) 04/27/2015   MRSA (methicillin resistant staph aureus) culture positive 03/27/2015   History of osteomyelitis 03/27/2015   Fatty liver 09/30/2014   Hot flashes 07/15/2014   Abnormal SPEP  04/17/2014   Fracture of left leg 04/17/2014   Cushingoid side effect of steroids ([place]) 04/17/2014   Internal hemorrhoids    Pulmonary sarcoidosis (HCC)    Major depression in full remission ([place])    Preoperative clearance 03/25/2014   Obstructive chronic bronchitis without exacerbation ([place]) 09/18/2013   Chest pain 04/11/2013   Hypertensive heart disease with chronic diastolic congestive heart failure ([place])    Solitary pulmonary nodule, on CT 02/2013 - stable over 2 years in 2015 02/20/2013   Gout 08/20/2010   Deficiency anemia 09/18/2009   Morbid obesity ([employer] [employer]) 06/04/2009   Sleep apnea 04/21/2009   Sarcoidosis of lung ([place]) 04/10/2007   Hyperlipidemia associated with type 2 diabetes mellitus ([place]) 08/21/2006   Hypertension associated with diabetes ([place]) 08/21/2006   GERD 08/21/2006   Type II diabetes mellitus with renal manifestations ([place]) 08/21/2006    Oretha Caprice, PT,MPT 07/20/2021, 10:42 AM  [facility] Physical Therapy [address] [place], [name], [address] Phone: [phone]   Fax:  [phone]  Name: [name] MRN: [id number] Date of Birth: April 10, 1961  PHYSICAL THERAPY DISCHARGE SUMMARY  Visits from Start of Care: 5  Current functional level related to goals / functional outcomes: see above    Remaining deficits: see above    Education / Equipment: HEP   Patient agrees to discharge. Patient goals were not met. Patient is being discharged due to not returning since the last visit.  Kearney Hard, PT, MPT 10/06/21 9:11 AM

## 2021-07-21 ENCOUNTER — Ambulatory Visit (INDEPENDENT_AMBULATORY_CARE_PROVIDER_SITE_OTHER): Payer: 59 | Admitting: Family Medicine

## 2021-07-21 ENCOUNTER — Encounter: Payer: Self-pay | Admitting: Family Medicine

## 2021-07-21 ENCOUNTER — Other Ambulatory Visit: Payer: Self-pay

## 2021-07-21 VITALS — BP 138/72 | HR 84 | Temp 98.1°F | Ht 66.0 in | Wt 225.4 lb

## 2021-07-21 DIAGNOSIS — F3342 Major depressive disorder, recurrent, in full remission: Secondary | ICD-10-CM | POA: Diagnosis not present

## 2021-07-21 DIAGNOSIS — E1129 Type 2 diabetes mellitus with other diabetic kidney complication: Secondary | ICD-10-CM

## 2021-07-21 DIAGNOSIS — E538 Deficiency of other specified B group vitamins: Secondary | ICD-10-CM

## 2021-07-21 DIAGNOSIS — I152 Hypertension secondary to endocrine disorders: Secondary | ICD-10-CM

## 2021-07-21 DIAGNOSIS — Z0000 Encounter for general adult medical examination without abnormal findings: Secondary | ICD-10-CM | POA: Diagnosis not present

## 2021-07-21 DIAGNOSIS — E785 Hyperlipidemia, unspecified: Secondary | ICD-10-CM

## 2021-07-21 DIAGNOSIS — E1159 Type 2 diabetes mellitus with other circulatory complications: Secondary | ICD-10-CM | POA: Diagnosis not present

## 2021-07-21 DIAGNOSIS — E1169 Type 2 diabetes mellitus with other specified complication: Secondary | ICD-10-CM | POA: Diagnosis not present

## 2021-07-21 DIAGNOSIS — M1A40X0 Other secondary chronic gout, unspecified site, without tophus (tophi): Secondary | ICD-10-CM | POA: Diagnosis not present

## 2021-07-21 DIAGNOSIS — Z794 Long term (current) use of insulin: Secondary | ICD-10-CM

## 2021-07-21 DIAGNOSIS — D509 Iron deficiency anemia, unspecified: Secondary | ICD-10-CM

## 2021-07-21 DIAGNOSIS — E875 Hyperkalemia: Secondary | ICD-10-CM

## 2021-07-21 LAB — CBC WITH DIFFERENTIAL/PLATELET
Basophils Absolute: 0.1 10*3/uL (ref 0.0–0.1)
Basophils Relative: 0.6 % (ref 0.0–3.0)
Eosinophils Absolute: 0.5 10*3/uL (ref 0.0–0.7)
Eosinophils Relative: 4.5 % (ref 0.0–5.0)
HCT: 32.4 % — ABNORMAL LOW (ref 36.0–46.0)
Hemoglobin: 11 g/dL — ABNORMAL LOW (ref 12.0–15.0)
Lymphocytes Relative: 23.2 % (ref 12.0–46.0)
Lymphs Abs: 2.3 10*3/uL (ref 0.7–4.0)
MCHC: 33.8 g/dL (ref 30.0–36.0)
MCV: 90.2 fl (ref 78.0–100.0)
Monocytes Absolute: 0.6 10*3/uL (ref 0.1–1.0)
Monocytes Relative: 5.6 % (ref 3.0–12.0)
Neutro Abs: 6.6 10*3/uL (ref 1.4–7.7)
Neutrophils Relative %: 66.1 % (ref 43.0–77.0)
Platelets: 299 10*3/uL (ref 150.0–400.0)
RBC: 3.6 Mil/uL — ABNORMAL LOW (ref 3.87–5.11)
RDW: 13.3 % (ref 11.5–15.5)
WBC: 10 10*3/uL (ref 4.0–10.5)

## 2021-07-21 LAB — COMPREHENSIVE METABOLIC PANEL
ALT: 21 U/L (ref 0–35)
AST: 24 U/L (ref 0–37)
Albumin: 4.5 g/dL (ref 3.5–5.2)
Alkaline Phosphatase: 76 U/L (ref 39–117)
BUN: 18 mg/dL (ref 6–23)
CO2: 25 mEq/L (ref 19–32)
Calcium: 10 mg/dL (ref 8.4–10.5)
Chloride: 106 mEq/L (ref 96–112)
Creatinine, Ser: 1.2 mg/dL (ref 0.40–1.20)
GFR: 48.7 mL/min — ABNORMAL LOW (ref >60.00)
Glucose, Bld: 176 mg/dL — ABNORMAL HIGH (ref 70–99)
Potassium: 5.3 mEq/L — ABNORMAL HIGH (ref 3.5–5.1)
Sodium: 139 mEq/L (ref 135–145)
Total Bilirubin: 0.6 mg/dL (ref 0.2–1.2)
Total Protein: 7.1 g/dL (ref 6.0–8.3)

## 2021-07-21 LAB — LIPID PANEL
Cholesterol: 164 mg/dL (ref 0–200)
HDL: 49 mg/dL (ref >39.00)
NonHDL: 114.97
Total CHOL/HDL Ratio: 3
Triglycerides: 260 mg/dL — ABNORMAL HIGH (ref 0.0–149.0)
VLDL: 52 mg/dL — ABNORMAL HIGH (ref 0.0–40.0)

## 2021-07-21 LAB — VITAMIN B12: Vitamin B-12: 446 pg/mL (ref 211–911)

## 2021-07-21 LAB — LDL CHOLESTEROL, DIRECT: Direct LDL: 84 mg/dL

## 2021-07-21 LAB — FERRITIN: Ferritin: 185.9 ng/mL (ref 10.0–291.0)

## 2021-07-21 LAB — URIC ACID: Uric Acid, Serum: 4.9 mg/dL (ref 2.4–7.0)

## 2021-07-21 MED ORDER — CYANOCOBALAMIN 1000 MCG/ML IJ SOLN
1000.0000 ug | Freq: Once | INTRAMUSCULAR | Status: AC
  Administered 2021-07-21: 1000 ug via INTRAMUSCULAR

## 2021-07-21 NOTE — Addendum Note (Signed)
Addended by: Linton Ham on: 07/21/2021 09:05 AM   Modules accepted: Orders

## 2021-07-24 ENCOUNTER — Ambulatory Visit: Payer: 59 | Admitting: Orthopaedic Surgery

## 2021-07-27 ENCOUNTER — Encounter: Payer: 59 | Admitting: Physical Therapy

## 2021-07-30 ENCOUNTER — Other Ambulatory Visit: Payer: Self-pay

## 2021-07-30 ENCOUNTER — Ambulatory Visit (INDEPENDENT_AMBULATORY_CARE_PROVIDER_SITE_OTHER): Payer: 59 | Admitting: Endocrinology

## 2021-07-30 VITALS — BP 170/78 | HR 90 | Ht 66.0 in | Wt 227.2 lb

## 2021-07-30 DIAGNOSIS — E1129 Type 2 diabetes mellitus with other diabetic kidney complication: Secondary | ICD-10-CM

## 2021-07-30 DIAGNOSIS — Z794 Long term (current) use of insulin: Secondary | ICD-10-CM

## 2021-07-30 LAB — POCT GLYCOSYLATED HEMOGLOBIN (HGB A1C): Hemoglobin A1C: 7.4 % — AB (ref 4.0–5.6)

## 2021-07-30 MED ORDER — LANTUS SOLOSTAR 100 UNIT/ML ~~LOC~~ SOPN: 16.0000 [IU] | PEN_INJECTOR | SUBCUTANEOUS | 2 refills | Status: DC

## 2021-07-30 MED ORDER — TRULICITY 4.5 MG/0.5ML ~~LOC~~ SOAJ: 4.5000 mg | SUBCUTANEOUS | 3 refills | Status: DC

## 2021-07-30 NOTE — Patient Instructions (Addendum)
I have sent a prescription to your pharmacy, to increase the trulicity Please reduce the Lantus to 16 units each morning. Please continue the same other medications.  check your blood sugar 4 times a day: before the 3 meals, and at bedtime.  also check if you have symptoms of your blood sugar being too high or too low.  please keep a record of the readings and bring it to your next appointment here (or you can bring the meter itself).  You can write it on any piece of paper.  please call us sooner if your blood sugar goes below 70, or if you have a lot of readings over 200.   Please come back for a follow-up appointment in 3-4 months.

## 2021-07-30 NOTE — Progress Notes (Signed)
Subjective:    Patient ID: [name], female    DOB: 1961-07-11, 60 y.o.   MRN: [id number]  HPI Pt returns for f/u of diabetes mellitus:  DM type: Insulin-requiring type 2 Dx'ed: 4098 Complications: PN and mild CAD.  Therapy: insulin since 1191, Trulicity, and metformin.  GDM: never DKA: never Severe hypoglycemia: never Pancreatitis: never Pancreatic imaging: CT (2015) showed fatty atrophy.  Other: she takes 2 QD insulins, after poor results with multiple daily injections; she intermittently takes prednisone for sarcoidosis or AB.  Interval history: no recent steroids. She takes meds as rx'ed.  no cbg record, but states cbg's are in the mid-100's. It is in general lowest in the afternoon Past Medical History:  Diagnosis Date   Abnormal SPEP 04/17/2014   Acute left ankle pain 01/26/2017   ANEMIA-UNSPECIFIED 09/18/2009   Anxiety    Arthritis    Blood transfusion without reported diagnosis    Cataract    CHF (congestive heart failure) (HCC)    Chronic diastolic heart failure, NYHA class 2 (HCC)    Normal LVEDP by May 2018   COPD (chronic obstructive pulmonary disease) ([place])    Depression    DIABETES MELLITUS, TYPE II 08/21/2006   Diabetic osteomyelitis ([place]) 05/29/2015   Fatty liver    Fracture of 5th metatarsal    non union   GERD 47/82/9562   Had fundoplication   GOUT 13/05/6577   Hammer toe of right foot    2-5th toes   Hx of umbilical hernia repair    HYPERLIPIDEMIA 08/21/2006   HYPERTENSION 08/21/2006   Infection of wound due to methicillin resistant Staphylococcus aureus (MRSA)    Internal hemorrhoids    Kidney problem    Multiple allergies 10/14/2016   OBESITY 06/04/2009   Onychomycosis 10/27/2015   Osteomyelitis of left foot ([place]) 05/29/2015   Osteoporosis    Pulmonary sarcoidosis ([place])    Followed locally by pulmonology, but also by Dr. Casper [place] at [facility]   Right knee pain 01/26/2017   Vocal cord dysfunction    Wears partial dentures      Past Surgical History:  Procedure Laterality Date   ABDOMINAL HYSTERECTOMY     APPENDECTOMY     BLADDER SUSPENSION  11/11/2011   Procedure: TRANSVAGINAL TAPE (TVT) PROCEDURE;  Surgeon: Olga Millers, MD;  Location: [facility];  Service: Gynecology;  Laterality: N/A;   CAROTIDS  02/18/11   CAROTID DUPLEX; VERTEBRALS ARE PATENT WITH ANTEGRADE FLOW. ICA/CCA RATIO 1.61 ON RIGHT AND 0.75 ON LEFT   CATARACT EXTRACTION, BILATERAL     CHOLECYSTECTOMY  1984   COLONOSCOPY  04/29/2010   [name]   CYSTOSCOPY  11/11/2011   Procedure: CYSTOSCOPY;  Surgeon: Olga Millers, MD;  Location: [facility];  Service: Gynecology;  Laterality: N/A;   EXTUBATION (ENDOTRACHEAL) IN OR N/A 09/23/2017   Procedure: EXTUBATION (ENDOTRACHEAL) IN OR;  Surgeon: Helayne Seminole, MD;  Location: [place];  Service: ENT;  Laterality: N/A;   FIBEROPTIC LARYNGOSCOPY AND TRACHEOSCOPY N/A 09/23/2017   Procedure: FLEXIBLE FIBEROPTIC LARYNGOSCOPY;  Surgeon: Helayne Seminole, MD;  Location: [place];  Service: ENT;  Laterality: N/A;   FRACTURE SURGERY     foot   [place] Left 10/02/2018   Procedure: HALLUX ARTHRODESIS;  Surgeon: Edrick Kins, DPM;  Location: [facility];  Service: Podiatry;  Laterality: Left;   HAMMER TOE SURGERY Left 10/02/2018   Procedure: HAMMER TOE CORRECTION 2ND 3RD 4RD FIFTH TOE;  Surgeon: Edrick Kins, DPM;  Location: [facility];  Service: Podiatry;  Laterality: Left;   HAMMER TOE SURGERY Right 04/12/2019   Procedure: HAMMER TOE CORRECTION, 2ND, 3RD, 4TH AND 5TH TOES OF RIGHT FOOT;  Surgeon: Edrick Kins, DPM;  Location: [place];  Service: Podiatry;  Laterality: Right;   HAMMER TOE SURGERY Left 10/25/2019   Procedure: HAMMER TOE REPAIR SECOND, THIRD, FOUTH;  Surgeon: Edrick Kins, DPM;  Location: [facility];  Service: Podiatry;  Laterality: Left;   HERNIA REPAIR     I & D EXTREMITY Left 06/27/2015   Procedure: Partial Excision Left Calcaneus, Place Antibiotic Beads, and Wound VAC;  Surgeon: Newt Minion, MD;   Location: [place];  Service: Orthopedics;  Laterality: Left;   JOINT REPLACEMENT     KNEE ARTHROSCOPY     right   LEFT AND RIGHT HEART CATHETERIZATION WITH CORONARY ANGIOGRAM N/A 04/23/2013   Procedure: LEFT AND RIGHT HEART CATHETERIZATION WITH CORONARY ANGIOGRAM;  Surgeon: Leonie Man, MD;  Location: [facility];  Service: Cardiovascular;  Laterality: N/A;   LEFT HEART CATH AND CORONARY ANGIOGRAPHY N/A 03/11/2017   Procedure: Left Heart Cath and Coronary Angiography;  Surgeon: Sherren Mocha, MD;  Location: [place] [facility]; angiographically minimal CAD in the LAD otherwise normal.;  Normal LVEDP.  FALSE POSITIVE MYOVIEW   LEFT HEART CATH AND CORONARY ANGIOGRAPHY  07/20/2010   LVEF 50-55% WITH VERY MILD GLOBAL HYPOKINESIA; ESSENTIALLY NORMAL CORONARY ARTERIES; NORMAL LV FUNCTION   METATARSAL OSTEOTOMY WITH OPEN REDUCTION INTERNAL FIXATION (ORIF) METATARSAL WITH FUSION Left 04/09/2014   Procedure: LEFT FOOT FRACTURE OPEN TREATMENT METATARSAL INCLUDES INTERNAL FIXATION EACH;  Surgeon: Lorn Junes, MD;  Location: [place];  Service: Orthopedics;  Laterality: Left;   NISSEN FUNDOPLICATION  3536   NM MYOVIEW LTD --> FALSE POSITIVE  03/10/2017   : Moderate size "stress-induced "perfusion defect at the apex as well as "ill-defined stress-induced perfusion defect in the lateral wall.  EF 55%.  INTERMEDIATE risk. -->  FALSE POSITIVE   ORIF DISTAL FEMUR FRACTURE  08/08/2020   [facility]: ORIF of left extra-articular periprosthetic distal femur fracture with synthesis of the femur plate with cortical nonlocking screws.  (Thought to be pathologic fracture due to osteoporosis) -> after a fall   ORIF FEMUR W/ PERI-IMPLANT  08/29/2020   [facility]) revision of left femur fracture ORIF plate fixation screws; culture positive for MSSA   Right and left CARDIAC CATHETERIZATION  04/23/2013   Angiographic normal coronaries; LVEDP 20 mmHg, PCWP 12-14 mmHg, RAP 12 mmHg.;  Fick CO/CI 4.9/2.2   SHOULDER ARTHROSCOPY Left 03/14/2019   Procedure: LEFT SHOULDER ARTHROSCOPY, DEBRIDEMENT, AND DECOMPRESSION;  Surgeon: Newt Minion, MD;  Location: [place];  Service: Orthopedics;  Laterality: Left;   TOTAL KNEE ARTHROPLASTY Right 06/29/2017   Procedure: RIGHT TOTAL KNEE ARTHROPLASTY;  Surgeon: Newt Minion, MD;  Location: [place];  Service: Orthopedics;  Laterality: Right;   TOTAL KNEE ARTHROPLASTY Left 12/07/2017   TOTAL KNEE ARTHROPLASTY Left 12/07/2017   Procedure: LEFT TOTAL KNEE ARTHROPLASTY;  Surgeon: Newt Minion, MD;  Location: [place];  Service: Orthopedics;  Laterality: Left;   TRACHEOSTOMY TUBE PLACEMENT N/A 09/20/2017   Procedure: AWAKE INTUBATION WITH ANESTHESIA WITH VIDEO ASSISTANCE;  Surgeon: Helayne Seminole, MD;  Location: [facility] [facility];  Service: ENT;  Laterality: N/A;   TRANSTHORACIC ECHOCARDIOGRAM  08/2014   Normal LV size and function.  Mild LVH.  EF 55-60%.  Normal regional wall motion.  GR 1  DD.  Normal RV size and function .   TRANSTHORACIC ECHOCARDIOGRAM  08/12/2020    [facility]): Normal LV size and function.  EF 55 to 60%.  No RWM A.  Normal filling pattern.  Normal RV.  No significant valvular disease-stenosis or regurgitation.  No vegetation.   TUBAL LIGATION     with reversal in 1994   UPPER GASTROINTESTINAL ENDOSCOPY     VENTRAL HERNIA REPAIR      Social History   Socioeconomic History   Marital status: Married    Spouse name: MARLENA BARBATO   Number of children: 2   Years of education: 12   Highest education level: Not on file  Occupational History   Occupation: DISABLED  Tobacco Use   Smoking status: Never   Smokeless tobacco: Never  Vaping Use   Vaping Use: Never used  Substance and Sexual Activity   Alcohol use: No    Alcohol/week: 0.0 standard drinks   Drug use: No   Sexual activity: Yes    Birth control/protection: Surgical  Other Topics Concern   Not on file  Social History Narrative   Married 1994. 2 sons  who both live close and 1 grandson.       Disability due to sarcoidosis. 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 [employer]   Social Determinants of Health   Financial Resource Strain: Low Risk    Difficulty of Paying Living Expenses: Not hard at all  Food Insecurity: No Food Insecurity   Worried About Charity fundraiser in the Last Year: Never true   Arboriculturist in the Last Year: Never true  Transportation Needs: No Transportation Needs   Lack of Transportation (Medical): No   Lack of Transportation (Non-Medical): No  Physical Activity: Insufficiently Active   Days of Exercise per Week: 3 days   Minutes of Exercise per Session: 30 min  Stress: No Stress Concern Present   Feeling of Stress : Not at all  Social Connections: Moderately Integrated   Frequency of Communication with Friends and Family: More than three times a week   Frequency of Social Gatherings with Friends and Family: More than three times a week   Attends Religious Services: More than 4 times per year   Active Member of [employer] or Organizations: No   Attends Archivist Meetings: Never   Marital Status: Married  Human resources officer Violence: Not At Risk   Fear of Current or Ex-Partner: No   Emotionally Abused: No   Physically Abused: No   Sexually Abused: No    Current Outpatient Medications on File Prior to Visit  Medication Sig Dispense Refill   albuterol (PROVENTIL HFA;VENTOLIN HFA) 108 (90 Base) MCG/ACT inhaler Inhale 1-2 puffs into the lungs every 6 (six) hours as needed for wheezing or shortness of breath. 8 g 5   allopurinol (ZYLOPRIM) 300 MG tablet Take one tablet daily. 30 tablet 2   aspirin EC 81 MG tablet Take 1 tablet (81 mg total) by mouth daily. 30 tablet 11   atorvastatin (LIPITOR) 40 MG tablet TAKE 1 TABLET BY MOUTH EVERY DAY 90 tablet 3   BREO ELLIPTA  200-25 MCG/INH AEPB TAKE 1 PUFF BY MOUTH EVERY DAY 60 each 5   buPROPion (WELLBUTRIN XL) 300 MG 24 hr tablet TAKE 1 TABLET BY MOUTH EVERY DAY 90 tablet  3   carvedilol (COREG) 12.5 MG tablet Take 12.5 mg by mouth 2 (two) times daily with a meal.     Cholecalciferol 25 MCG (1000 UT) capsule Take 1,000 Units by mouth in the morning and at bedtime.     diclofenac Sodium (VOLTAREN) 1 % GEL Apply 1 application topically as needed (pain.).      diltiazem (CARDIZEM CD) 120 MG 24 hr capsule Take 1 capsule (120 mg total) by mouth daily. 90 capsule 3   famotidine (PEPCID) 20 MG tablet Take 1 tablet (20 mg total) by mouth at bedtime. 30 tablet 3   fenofibrate 160 MG tablet TAKE 1 TABLET BY MOUTH EVERY DAY 90 tablet 3   fluticasone (FLONASE) 50 MCG/ACT nasal spray Place 2 sprays into both nostrils daily as needed for allergies. 48 mL 3   furosemide (LASIX) 40 MG tablet TAKE 1 TABLET BY MOUTH TWICE A DAY 180 tablet 1   gabapentin (NEURONTIN) 100 MG capsule TAKE 1 CAPSULE BY MOUTH THREE TIMES A DAY 270 capsule 3   glucose blood (CONTOUR NEXT TEST) test strip 1 each by Other route 4 (four) times daily. And lancets 4/day 400 each 3   insulin lispro (HUMALOG KWIKPEN) 100 UNIT/ML KwikPen Inject 0.15 mLs (15 Units total) into the skin daily with supper. 15 mL 29   Insulin Pen Needle (PEN NEEDLES) 32G X 4 MM MISC 1 each by Does not apply route 4 (four) times daily. E11.9 400 each 0   ipratropium (ATROVENT) 0.03 % nasal spray PLACE 2 SPRAYS INTO BOTH NOSTRILS EVERY 12 (TWELVE) HOURS. 30 mL 2   KLOR-CON M20 20 MEQ tablet TAKE 1 & 1/2 TABLETS BY MOUTH TWICE A DAY 270 tablet 2   lactulose (CHRONULAC) 10 GM/15ML solution TAKE 15 MLS BY MOUTH DAILY AS NEEDED FOR CONSTIPATION. 1419 mL 1   Lancet Devices (EASY MINI EJECT LANCING DEVICE) MISC Use as directed 4 times daily to test blood sugar 1 each 3   linaclotide (LINZESS) 290 MCG CAPS capsule Take by mouth as needed.     LORazepam (ATIVAN) 0.5 MG tablet Take 0.5 mg by mouth  every 12 (twelve) hours as needed for anxiety.     meloxicam (MOBIC) 15 MG tablet Take 1 tablet (15 mg total) by mouth daily. 30 tablet 1   metFORMIN (GLUCOPHAGE-XR) 500 MG 24 hr tablet Take 4 tablets (2,000 mg total) by mouth daily with breakfast. 360 tablet 3   methocarbamol (ROBAXIN) 500 MG tablet Take 1 tablet (500 mg total) by mouth every 8 (eight) hours as needed for muscle spasms. 40 tablet 0   Microlet Lancets MISC 1 each by Does not apply route 2 (two) times daily. E11.9 100 each 2   nitroGLYCERIN (NITROSTAT) 0.4 MG SL tablet Place 1 tablet (0.4 mg total) under the tongue every 5 (five) minutes as needed for chest pain. Reported on 01/05/2016 25 tablet 6   nystatin (MYCOSTATIN) 100000 UNIT/ML suspension nystatin 100,000 unit/mL oral suspension  SWISH, GARGLE, AND SPIT 20 ML 4 TIMES A DAY UNTIL RESOLVED     ondansetron (ZOFRAN-ODT) 4 MG disintegrating tablet Take 1 tablet (4 mg total) by mouth every 8 (eight) hours as needed for nausea or vomiting. 50 tablet 2   RABEprazole (ACIPHEX) 20 MG tablet Take 1 tablet (20 mg total) by mouth daily. 90 tablet 3   telmisartan (MICARDIS) 20 MG tablet TAKE 1 TABLET BY MOUTH EVERY DAY 90 tablet 1   venlafaxine XR (EFFEXOR-XR) 75 MG 24 hr capsule TAKE 1  CAPSULE BY MOUTH TWICE A DAY 180 capsule 3   [DISCONTINUED] mupirocin nasal ointment (BACTROBAN) 2 % Place 1 application into the nose 2 (two) times daily. Use one-half of tube in each nostril twice daily for five (5) days. After application, press sides of nose together and gently massage. 10 g 0   Current Facility-Administered Medications on File Prior to Visit  Medication Dose Route Frequency Provider Last Rate Last Admin   cyanocobalamin ((VITAMIN B-12)) injection 1,000 mcg  1,000 mcg Intramuscular Once [name], MD        Allergies  Allergen Reactions   Methotrexate Other (See Comments)    Peri-oral and buccal lesions   Vancomycin Other (See Comments)    DOSE RELATED NEPHROTOXICITY    Lisinopril Cough   Chlorhexidine Itching   Clindamycin/Lincomycin Nausea And Vomiting and Rash   Doxycycline Rash   Teflaro [Ceftaroline] Rash and Other (See Comments)    Tolerates ceftriaxone     Family History  Problem Relation Age of Onset   Diabetes Father    Heart attack Father 35   Coronary artery disease Father    Heart failure Father    Throat cancer Father    Hypertension Father    Heart disease Father    Sleep apnea Father    Obesity Father    COPD Mother    Emphysema Mother    Asthma Mother    Heart failure Mother    Breast cancer Mother    Diabetes Mother    Kidney disease Mother    Thyroid disease Mother    Heart attack Maternal Grandfather    Sarcoidosis Maternal Uncle    Lung cancer Brother    Diabetes Brother    Colon cancer Neg Hx    Colon polyps Neg Hx    Esophageal cancer Neg Hx    Rectal cancer Neg Hx    Stomach cancer Neg Hx     BP (!) 170/78 (BP Location: Right Arm, Patient Position: Sitting, Cuff Size: Normal)   Pulse 90   Ht 5\' 6"  (1.676 m)   Wt 227 lb 3.2 oz (103.1 kg)   SpO2 97%   BMI 36.67 kg/m    Review of Systems She denies hypoglycemia and nausea.      Objective:   Physical Exam VITAL SIGNS:  See vs page GENERAL: no distress Pulses: dorsalis pedis intact bilat.   MSK: no deformity of the feet CV: no leg edema Skin:  no ulcer on the feet.  normal color and temp on the feet. Neuro: sensation is intact to touch on the feet Ext: there is bilateral onychomycosis of the toenails.    Lab Results  Component Value Date   HGBA1C 7.4 (A) 07/30/2021      Assessment & Plan:  Insulin-requiring type 2 DM: uncontrolled.   Patient Instructions  I have sent a prescription to your pharmacy, to increase the trulicity Please reduce the Lantus to 16 units each morning. Please continue the same other medications.  check your blood sugar 4 times a day: before the 3 meals, and at bedtime.  also check if you have symptoms of your blood  sugar being too high or too low.  please keep a record of the readings and bring it to your next appointment here (or you can bring the meter itself).  You can write it on any piece of paper.  please call us sooner if your blood sugar goes below 70, or if you have a lot of  readings over 200.   Please come back for a follow-up appointment in 3-4 months.

## 2021-08-03 DIAGNOSIS — M7551 Bursitis of right shoulder: Secondary | ICD-10-CM | POA: Insufficient documentation

## 2021-08-19 DIAGNOSIS — E119 Type 2 diabetes mellitus without complications: Secondary | ICD-10-CM | POA: Diagnosis not present

## 2021-08-19 DIAGNOSIS — J45909 Unspecified asthma, uncomplicated: Secondary | ICD-10-CM | POA: Diagnosis not present

## 2021-08-19 DIAGNOSIS — R053 Chronic cough: Secondary | ICD-10-CM | POA: Diagnosis not present

## 2021-08-19 DIAGNOSIS — I10 Essential (primary) hypertension: Secondary | ICD-10-CM | POA: Diagnosis not present

## 2021-08-19 DIAGNOSIS — D8689 Sarcoidosis of other sites: Secondary | ICD-10-CM | POA: Diagnosis not present

## 2021-08-19 DIAGNOSIS — Z881 Allergy status to other antibiotic agents status: Secondary | ICD-10-CM | POA: Diagnosis not present

## 2021-08-19 DIAGNOSIS — Z7951 Long term (current) use of inhaled steroids: Secondary | ICD-10-CM | POA: Diagnosis not present

## 2021-08-19 DIAGNOSIS — R079 Chest pain, unspecified: Secondary | ICD-10-CM | POA: Diagnosis not present

## 2021-08-19 DIAGNOSIS — Z79899 Other long term (current) drug therapy: Secondary | ICD-10-CM | POA: Diagnosis not present

## 2021-08-19 DIAGNOSIS — Z888 Allergy status to other drugs, medicaments and biological substances status: Secondary | ICD-10-CM | POA: Diagnosis not present

## 2021-08-19 DIAGNOSIS — Z794 Long term (current) use of insulin: Secondary | ICD-10-CM | POA: Diagnosis not present

## 2021-08-19 DIAGNOSIS — Z7984 Long term (current) use of oral hypoglycemic drugs: Secondary | ICD-10-CM | POA: Diagnosis not present

## 2021-08-19 DIAGNOSIS — D862 Sarcoidosis of lung with sarcoidosis of lymph nodes: Secondary | ICD-10-CM | POA: Diagnosis not present

## 2021-08-19 DIAGNOSIS — Z7982 Long term (current) use of aspirin: Secondary | ICD-10-CM | POA: Diagnosis not present

## 2021-08-20 ENCOUNTER — Ambulatory Visit: Payer: 59 | Admitting: Specialist

## 2021-08-21 ENCOUNTER — Other Ambulatory Visit: Payer: 59

## 2021-08-24 ENCOUNTER — Other Ambulatory Visit: Payer: Self-pay

## 2021-08-24 ENCOUNTER — Other Ambulatory Visit (INDEPENDENT_AMBULATORY_CARE_PROVIDER_SITE_OTHER): Payer: 59

## 2021-08-24 DIAGNOSIS — E875 Hyperkalemia: Secondary | ICD-10-CM

## 2021-08-24 DIAGNOSIS — E538 Deficiency of other specified B group vitamins: Secondary | ICD-10-CM

## 2021-08-24 LAB — BASIC METABOLIC PANEL
BUN: 25 mg/dL — ABNORMAL HIGH (ref 6–23)
CO2: 30 mEq/L (ref 19–32)
Calcium: 9.6 mg/dL (ref 8.4–10.5)
Chloride: 98 mEq/L (ref 96–112)
Creatinine, Ser: 1.25 mg/dL — ABNORMAL HIGH (ref 0.40–1.20)
GFR: 46.34 mL/min — ABNORMAL LOW (ref >60.00)
Glucose, Bld: 194 mg/dL — ABNORMAL HIGH (ref 70–99)
Potassium: 4.8 mEq/L (ref 3.5–5.1)
Sodium: 138 mEq/L (ref 135–145)

## 2021-08-24 LAB — VITAMIN B12: Vitamin B-12: 433 pg/mL (ref 211–911)

## 2021-08-30 ENCOUNTER — Other Ambulatory Visit: Payer: Self-pay | Admitting: Family Medicine

## 2021-09-07 ENCOUNTER — Other Ambulatory Visit: Payer: Self-pay | Admitting: Orthopaedic Surgery

## 2021-09-07 ENCOUNTER — Other Ambulatory Visit: Payer: Self-pay | Admitting: Endocrinology

## 2021-09-07 ENCOUNTER — Other Ambulatory Visit: Payer: Self-pay | Admitting: Internal Medicine

## 2021-09-07 DIAGNOSIS — Z794 Long term (current) use of insulin: Secondary | ICD-10-CM

## 2021-09-17 ENCOUNTER — Telehealth: Payer: Self-pay | Admitting: Endocrinology

## 2021-09-17 NOTE — Telephone Encounter (Signed)
Patient's husband [name] requests to be called at ph# [phone] re: patient has been out of Trulicity for 2 weeks due to a [employer] shortage of the dosage Patient takes.

## 2021-09-17 NOTE — Telephone Encounter (Signed)
Message sent thru MyChart 

## 2021-09-22 ENCOUNTER — Encounter: Payer: Self-pay | Admitting: Family Medicine

## 2021-09-22 ENCOUNTER — Ambulatory Visit (INDEPENDENT_AMBULATORY_CARE_PROVIDER_SITE_OTHER): Payer: 59 | Admitting: Family Medicine

## 2021-09-22 VITALS — BP 140/78 | HR 89 | Temp 97.8°F | Wt 221.6 lb

## 2021-09-22 DIAGNOSIS — R051 Acute cough: Secondary | ICD-10-CM

## 2021-09-22 DIAGNOSIS — J4550 Severe persistent asthma, uncomplicated: Secondary | ICD-10-CM | POA: Diagnosis not present

## 2021-09-22 LAB — POC COVID19 BINAXNOW: SARS Coronavirus 2 Ag: NEGATIVE

## 2021-09-22 MED ORDER — BENZONATATE 100 MG PO CAPS: 100.0000 mg | ORAL_CAPSULE | Freq: Three times a day (TID) | ORAL | 0 refills | Status: DC | PRN

## 2021-09-22 MED ORDER — PREDNISONE 20 MG PO TABS: 40.0000 mg | ORAL_TABLET | Freq: Every day | ORAL | 0 refills | Status: AC

## 2021-09-22 MED ORDER — AMOXICILLIN-POT CLAVULANATE 875-125 MG PO TABS: 1.0000 | ORAL_TABLET | Freq: Two times a day (BID) | ORAL | 0 refills | Status: DC

## 2021-09-22 NOTE — Patient Instructions (Signed)
It was very nice to see you today!  Merry Christmas!  Meds sent to pharmacy.  Only take prednisone if breathing worsens Monitor for covid if fevers start

## 2021-09-22 NOTE — Progress Notes (Signed)
Subjective:     Patient ID: [name], female    DOB: 11/29/1960, 60 y.o.   MRN: [id number]  Chief Complaint  Patient presents with   Cough    Dry cough, chest tightness. Started 12/13   Nasal Congestion    HPI-has had flu and covid immunizations. Dry cough/chest tightness since yesterday am.  Just got back from beach. Friend not sick Congestion/face pain, fatigue, some inc sob. Using albuterol.  Coughing all night. Ear pain.  No f/c.   No v/d.  Throat a little sore  Health Maintenance Due  Topic Date Due   COVID-19 Vaccine (5 - Booster for Pfizer series) 09/02/2021    Past Medical History:  Diagnosis Date   Abnormal SPEP 04/17/2014   Acute left ankle pain 01/26/2017   ANEMIA-UNSPECIFIED 09/18/2009   Anxiety    Arthritis    Blood transfusion without reported diagnosis    Cataract    CHF (congestive heart failure) (HCC)    Chronic diastolic heart failure, NYHA class 2 (HCC)    Normal LVEDP by May 2018   COPD (chronic obstructive pulmonary disease) ([place])    Depression    DIABETES MELLITUS, TYPE II 08/21/2006   Diabetic osteomyelitis ([place]) 05/29/2015   Fatty liver    Fracture of 5th metatarsal    non union   GERD 82/50/5397   Had fundoplication   GOUT 67/34/1937   Hammer toe of right foot    2-5th toes   Hx of umbilical hernia repair    HYPERLIPIDEMIA 08/21/2006   HYPERTENSION 08/21/2006   Infection of wound due to methicillin resistant Staphylococcus aureus (MRSA)    Internal hemorrhoids    Kidney problem    Multiple allergies 10/14/2016   OBESITY 06/04/2009   Onychomycosis 10/27/2015   Osteomyelitis of left foot ([place]) 05/29/2015   Osteoporosis    Pulmonary sarcoidosis ([place])    Followed locally by pulmonology, but also by Dr. Casper Harrison at [facility]   Right knee pain 01/26/2017   Vocal cord dysfunction    Wears partial dentures     Past Surgical History:  Procedure Laterality Date   ABDOMINAL HYSTERECTOMY     APPENDECTOMY     BLADDER  SUSPENSION  11/11/2011   Procedure: TRANSVAGINAL TAPE (TVT) PROCEDURE;  Surgeon: Olga Millers, MD;  Location: [facility];  Service: Gynecology;  Laterality: N/A;   CAROTIDS  02/18/11   CAROTID DUPLEX; VERTEBRALS ARE PATENT WITH ANTEGRADE FLOW. ICA/CCA RATIO 1.61 ON RIGHT AND 0.75 ON LEFT   CATARACT EXTRACTION, BILATERAL     CHOLECYSTECTOMY  1984   COLONOSCOPY  04/29/2010   [name]   CYSTOSCOPY  11/11/2011   Procedure: CYSTOSCOPY;  Surgeon: Olga Millers, MD;  Location: [facility];  Service: Gynecology;  Laterality: N/A;   EXTUBATION (ENDOTRACHEAL) IN OR N/A 09/23/2017   Procedure: EXTUBATION (ENDOTRACHEAL) IN OR;  Surgeon: Helayne Seminole, MD;  Location: [place];  Service: ENT;  Laterality: N/A;   FIBEROPTIC LARYNGOSCOPY AND TRACHEOSCOPY N/A 09/23/2017   Procedure: FLEXIBLE FIBEROPTIC LARYNGOSCOPY;  Surgeon: Helayne Seminole, MD;  Location: [place];  Service: ENT;  Laterality: N/A;   FRACTURE SURGERY     foot   [place] Left 10/02/2018   Procedure: HALLUX ARTHRODESIS;  Surgeon: Edrick Kins, DPM;  Location: [facility];  Service: Podiatry;  Laterality: Left;   HAMMER TOE SURGERY Left 10/02/2018   Procedure: HAMMER TOE CORRECTION 2ND 3RD 4RD FIFTH TOE;  Surgeon: Edrick Kins, DPM;  Location: [facility];  Service: Podiatry;  Laterality: Left;   HAMMER TOE SURGERY Right 04/12/2019   Procedure: HAMMER TOE CORRECTION, 2ND, 3RD, 4TH AND 5TH TOES OF RIGHT FOOT;  Surgeon: Edrick Kins, DPM;  Location: [place];  Service: Podiatry;  Laterality: Right;   HAMMER TOE SURGERY Left 10/25/2019   Procedure: HAMMER TOE REPAIR SECOND, THIRD, FOUTH;  Surgeon: Edrick Kins, DPM;  Location: [facility];  Service: Podiatry;  Laterality: Left;   HERNIA REPAIR     I & D EXTREMITY Left 06/27/2015   Procedure: Partial Excision Left Calcaneus, Place Antibiotic Beads, and Wound VAC;  Surgeon: Newt Minion, MD;  Location: [place];  Service: Orthopedics;  Laterality: Left;   JOINT REPLACEMENT     KNEE ARTHROSCOPY     right    LEFT AND RIGHT HEART CATHETERIZATION WITH CORONARY ANGIOGRAM N/A 04/23/2013   Procedure: LEFT AND RIGHT HEART CATHETERIZATION WITH CORONARY ANGIOGRAM;  Surgeon: Leonie Man, MD;  Location: [facility];  Service: Cardiovascular;  Laterality: N/A;   LEFT HEART CATH AND CORONARY ANGIOGRAPHY N/A 03/11/2017   Procedure: Left Heart Cath and Coronary Angiography;  Surgeon: Sherren Mocha, MD;  Location: [place] CV [facility]; angiographically minimal CAD in the LAD otherwise normal.;  Normal LVEDP.  FALSE POSITIVE MYOVIEW   LEFT HEART CATH AND CORONARY ANGIOGRAPHY  07/20/2010   LVEF 50-55% WITH VERY MILD GLOBAL HYPOKINESIA; ESSENTIALLY NORMAL CORONARY ARTERIES; NORMAL LV FUNCTION   METATARSAL OSTEOTOMY WITH OPEN REDUCTION INTERNAL FIXATION (ORIF) METATARSAL WITH FUSION Left 04/09/2014   Procedure: LEFT FOOT FRACTURE OPEN TREATMENT METATARSAL INCLUDES INTERNAL FIXATION EACH;  Surgeon: Lorn Junes, MD;  [facility];  Service: Orthopedics;  Laterality: Left;   NISSEN FUNDOPLICATION  5329   NM MYOVIEW LTD --> FALSE POSITIVE  03/10/2017   : Moderate size "stress-induced "perfusion defect at the apex as well as "ill-defined stress-induced perfusion defect in the lateral wall.  EF 55%.  INTERMEDIATE risk. -->  FALSE POSITIVE   ORIF DISTAL FEMUR FRACTURE  08/08/2020   [facility]: ORIF of left extra-articular periprosthetic distal femur fracture with synthesis of the femur plate with cortical nonlocking screws.  (Thought to be pathologic fracture due to osteoporosis) -> after a fall   ORIF FEMUR W/ PERI-IMPLANT  08/29/2020   [facility]) revision of left femur fracture ORIF plate fixation screws; culture positive for MSSA   Right and left CARDIAC CATHETERIZATION  04/23/2013   Angiographic normal coronaries; LVEDP 20 mmHg, PCWP 12-14 mmHg, RAP 12 mmHg.; Fick CO/CI 4.9/2.2   SHOULDER ARTHROSCOPY Left 03/14/2019   Procedure: LEFT SHOULDER ARTHROSCOPY, DEBRIDEMENT, AND  DECOMPRESSION;  Surgeon: Newt Minion, MD;  Location: [place];  Service: Orthopedics;  Laterality: Left;   TOTAL KNEE ARTHROPLASTY Right 06/29/2017   Procedure: RIGHT TOTAL KNEE ARTHROPLASTY;  Surgeon: Newt Minion, MD;  Location: [place];  Service: Orthopedics;  Laterality: Right;   TOTAL KNEE ARTHROPLASTY Left 12/07/2017   TOTAL KNEE ARTHROPLASTY Left 12/07/2017   Procedure: LEFT TOTAL KNEE ARTHROPLASTY;  Surgeon: Newt Minion, MD;  Location: [place];  Service: Orthopedics;  Laterality: Left;   TRACHEOSTOMY TUBE PLACEMENT N/A 09/20/2017   Procedure: AWAKE INTUBATION WITH ANESTHESIA WITH VIDEO ASSISTANCE;  Surgeon: Helayne Seminole, MD;  Location: [place] [facility];  Service: ENT;  Laterality: N/A;   TRANSTHORACIC ECHOCARDIOGRAM  08/2014   Normal LV size and function.  Mild LVH.  EF 55-60%.  Normal regional wall motion.  GR 1 DD.  Normal RV  size and function .   TRANSTHORACIC ECHOCARDIOGRAM  08/12/2020    [facility]): Normal LV size and function.  EF 55 to 60%.  No RWM A.  Normal filling pattern.  Normal RV.  No significant valvular disease-stenosis or regurgitation.  No vegetation.   TUBAL LIGATION     with reversal in [name]      Outpatient Medications Prior to Visit  Medication Sig Dispense Refill   albuterol (PROVENTIL HFA;VENTOLIN HFA) 108 (90 Base) MCG/ACT inhaler Inhale 1-2 puffs into the lungs every 6 (six) hours as needed for wheezing or shortness of breath. 8 g 5   allopurinol (ZYLOPRIM) 300 MG tablet TAKE 1 TABLET BY MOUTH EVERY DAY 90 tablet 0   aspirin EC 81 MG tablet Take 1 tablet (81 mg total) by mouth daily. 30 tablet 11   atorvastatin (LIPITOR) 40 MG tablet TAKE 1 TABLET BY MOUTH EVERY DAY 90 tablet 3   BREO ELLIPTA 200-25 MCG/INH AEPB TAKE 1 PUFF BY MOUTH EVERY DAY 60 each 5   buPROPion (WELLBUTRIN XL) 300 MG 24 hr tablet TAKE 1 TABLET BY MOUTH EVERY DAY 90 tablet 3   carvedilol (COREG) 12.5 MG tablet Take 12.5  mg by mouth 2 (two) times daily with a meal.     Cholecalciferol 25 MCG (1000 UT) capsule Take 1,000 Units by mouth in the morning and at bedtime.     diclofenac Sodium (VOLTAREN) 1 % GEL Apply 1 application topically as needed (pain.).      diltiazem (CARDIZEM CD) 120 MG 24 hr capsule Take 1 capsule (120 mg total) by mouth daily. 90 capsule 3   Dulaglutide (TRULICITY) 4.5 PZ/0.2HE SOPN Inject 4.5 mg as directed once a week. 6 mL 3   famotidine (PEPCID) 20 MG tablet Take 1 tablet (20 mg total) by mouth at bedtime. 30 tablet 3   fenofibrate 160 MG tablet TAKE 1 TABLET BY MOUTH EVERY DAY 90 tablet 3   fluticasone (FLONASE) 50 MCG/ACT nasal spray Place 2 sprays into both nostrils daily as needed for allergies. 48 mL 3   furosemide (LASIX) 40 MG tablet TAKE 1 TABLET BY MOUTH TWICE A DAY 180 tablet 1   gabapentin (NEURONTIN) 100 MG capsule TAKE 1 CAPSULE BY MOUTH THREE TIMES A DAY 270 capsule 3   glucose blood (CONTOUR NEXT TEST) test strip 1 each by Other route 4 (four) times daily. And lancets 4/day 400 each 3   HUMALOG KWIKPEN 100 UNIT/ML KwikPen INJECT AS NEEDED FOR A TOTAL OF 25 UNITS PER DAY 15 mL 30   insulin glargine (LANTUS SOLOSTAR) 100 UNIT/ML Solostar Pen Inject 16 Units into the skin every morning. 30 mL 2   Insulin Pen Needle (PEN NEEDLES) 32G X 4 MM MISC 1 each by Does not apply route 4 (four) times daily. E11.9 400 each 0   ipratropium (ATROVENT) 0.03 % nasal spray PLACE 2 SPRAYS INTO BOTH NOSTRILS EVERY 12 (TWELVE) HOURS. 30 mL 2   KLOR-CON M20 20 MEQ tablet TAKE 1 & 1/2 TABLETS BY MOUTH TWICE A DAY 270 tablet 2   lactulose (CHRONULAC) 10 GM/15ML solution TAKE 15 MLS BY MOUTH DAILY AS NEEDED FOR CONSTIPATION. 1419 mL 1   Lancet Devices (EASY MINI EJECT LANCING DEVICE) MISC Use as directed 4 times daily to test blood sugar 1 each 3   LINZESS 290 MCG CAPS capsule TAKE 1 CAPSULE BY MOUTH DAILY BEFORE BREAKFAST. 30 capsule 3  LORazepam (ATIVAN) 0.5 MG tablet Take 0.5 mg by mouth every 12  (twelve) hours as needed for anxiety.     meloxicam (MOBIC) 15 MG tablet Take 1 tablet (15 mg total) by mouth daily. 30 tablet 1   metFORMIN (GLUCOPHAGE-XR) 500 MG 24 hr tablet Take 4 tablets (2,000 mg total) by mouth daily with breakfast. 360 tablet 3   methocarbamol (ROBAXIN) 500 MG tablet Take 1 tablet (500 mg total) by mouth every 8 (eight) hours as needed for muscle spasms. 40 tablet 0   Microlet Lancets MISC 1 each by Does not apply route 2 (two) times daily. E11.9 100 each 2   nitroGLYCERIN (NITROSTAT) 0.4 MG SL tablet Place 1 tablet (0.4 mg total) under the tongue every 5 (five) minutes as needed for chest pain. Reported on 01/05/2016 25 tablet 6   nystatin (MYCOSTATIN) 100000 UNIT/ML suspension nystatin 100,000 unit/mL oral suspension  SWISH, GARGLE, AND SPIT 20 ML 4 TIMES A DAY UNTIL RESOLVED     ondansetron (ZOFRAN-ODT) 4 MG disintegrating tablet Take 1 tablet (4 mg total) by mouth every 8 (eight) hours as needed for nausea or vomiting. 50 tablet 2   RABEprazole (ACIPHEX) 20 MG tablet Take 1 tablet (20 mg total) by mouth daily. 90 tablet 3   telmisartan (MICARDIS) 20 MG tablet TAKE 1 TABLET BY MOUTH EVERY DAY 90 tablet 1   venlafaxine XR (EFFEXOR-XR) 75 MG 24 hr capsule TAKE 1 CAPSULE BY MOUTH TWICE A DAY 180 capsule 3   metFORMIN (GLUCOPHAGE) 1000 MG tablet TAKE 1 TABLET BY MOUTH 2 TIMES DAILY WITH A MEAL. 180 tablet 1   cyanocobalamin ((VITAMIN B-12)) injection 1,000 mcg      No facility-administered medications prior to visit.    Allergies  Allergen Reactions   Methotrexate Other (See Comments)    Peri-oral and buccal lesions   Vancomycin Other (See Comments)    DOSE RELATED NEPHROTOXICITY   Lisinopril Cough   Chlorhexidine Itching   Clindamycin/Lincomycin Nausea And Vomiting and Rash   Doxycycline Rash   Teflaro [Ceftaroline] Rash and Other (See Comments)    Tolerates ceftriaxone    XBM:[id number] except as noted in HPI Off pred for 1 yr d/t fx. A1C around  4      Objective:    Gen: WDWN NAD HEENT: NCAT, conjunctiva not injected, sclera nonicteric. TM WNL B, OP moist, no exudates  NECK:  supple, no thyromegaly, no nodes, no carotid bruits CARDIAC: RRR, S1S2+, no murmur. DP 2+B LUNGS: CTAB. No wheezes EXT:  no edema MSK: no gross abnormalities.  NEURO: A&O x3.  CN II-XII intact.  PSYCH: normal mood. Good eye contact   BP 140/78    Pulse 89    Temp 97.8 F (36.6 C) (Temporal)    Wt 221 lb 9.6 oz (100.5 kg)    SpO2 97%    BMI 35.77 kg/m  Wt Readings from Last 3 Encounters:  09/22/21 221 lb 9.6 oz (100.5 kg)  07/30/21 227 lb 3.2 oz (103.1 kg)  07/21/21 225 lb 6.4 oz (102.2 kg)       Assessment & Plan:   Problem List Items Addressed This Visit       Respiratory   Severe persistent asthma   Relevant Medications   predniSONE (DELTASONE) 20 MG tablet   Other Visit Diagnoses     Acute cough    -  Primary   Relevant Orders   POC COVID-19 (Completed)     Cough-suspect viral.  Could still be covid/flu.  Pt has covid tests at home and will test if fevers, etc.  For now, as very high risk, will do augmentin.  Pred to hold(knows how to adjust insulin) 2.  Asthma-cont meds.  As above  Meds ordered this encounter  Medications   amoxicillin-clavulanate (AUGMENTIN) 875-125 MG tablet    Sig: Take 1 tablet by mouth 2 (two) times daily.    Dispense:  20 tablet    Refill:  0   predniSONE (DELTASONE) 20 MG tablet    Sig: Take 2 tablets (40 mg total) by mouth daily with breakfast for 5 days.    Dispense:  10 tablet    Refill:  0   benzonatate (TESSALON PERLES) 100 MG capsule    Sig: Take 1 capsule (100 mg total) by mouth 3 (three) times daily as needed for cough.    Dispense:  20 capsule    Refill:  0    Wellington Hampshire., MD

## 2021-09-24 ENCOUNTER — Other Ambulatory Visit: Payer: Self-pay | Admitting: Endocrinology

## 2021-09-24 MED ORDER — OZEMPIC (2 MG/DOSE) 8 MG/3ML ~~LOC~~ SOPN: 2.0000 mg | PEN_INJECTOR | SUBCUTANEOUS | 3 refills | Status: DC

## 2021-09-30 ENCOUNTER — Other Ambulatory Visit: Payer: Self-pay | Admitting: Endocrinology

## 2021-10-05 ENCOUNTER — Other Ambulatory Visit: Payer: Self-pay | Admitting: Endocrinology

## 2021-10-05 MED ORDER — OZEMPIC (2 MG/DOSE) 8 MG/3ML ~~LOC~~ SOPN: 2.0000 mg | PEN_INJECTOR | SUBCUTANEOUS | 3 refills | Status: DC

## 2021-10-23 ENCOUNTER — Other Ambulatory Visit: Payer: Self-pay | Admitting: Family Medicine

## 2021-10-23 NOTE — Telephone Encounter (Signed)
See below

## 2021-10-23 NOTE — Telephone Encounter (Signed)
Called and spoke with pt and she will get her husband to call the insurance and she will mychart/call us back with names of what will be covered.

## 2021-10-23 NOTE — Telephone Encounter (Signed)
Please have her check with her insurance as to which proton pump inhibitors/PPIs are covered and send [place] an updated message

## 2021-10-27 ENCOUNTER — Ambulatory Visit: Payer: 59 | Admitting: Family Medicine

## 2021-10-28 ENCOUNTER — Encounter: Payer: Self-pay | Admitting: Hematology and Oncology

## 2021-11-02 ENCOUNTER — Other Ambulatory Visit: Payer: Self-pay | Admitting: Endocrinology

## 2021-11-02 DIAGNOSIS — M7551 Bursitis of right shoulder: Secondary | ICD-10-CM | POA: Diagnosis not present

## 2021-11-02 MED ORDER — OZEMPIC (2 MG/DOSE) 8 MG/3ML ~~LOC~~ SOPN: 2.0000 mg | PEN_INJECTOR | SUBCUTANEOUS | 3 refills | Status: DC

## 2021-11-03 ENCOUNTER — Other Ambulatory Visit: Payer: Self-pay | Admitting: Hematology and Oncology

## 2021-11-03 ENCOUNTER — Telehealth: Payer: Self-pay

## 2021-11-03 ENCOUNTER — Other Ambulatory Visit (HOSPITAL_COMMUNITY): Payer: Self-pay

## 2021-11-03 ENCOUNTER — Encounter: Payer: Self-pay | Admitting: Endocrinology

## 2021-11-03 ENCOUNTER — Encounter: Payer: Self-pay | Admitting: Hematology and Oncology

## 2021-11-03 DIAGNOSIS — D539 Nutritional anemia, unspecified: Secondary | ICD-10-CM

## 2021-11-03 NOTE — Telephone Encounter (Addendum)
Patient Advocate Encounter  Prior Authorization for [employer] injector has been approved.    PA# N/A  Effective dates: 11/03/21 through 11/02/22  Per Test Claim Patients co-pay is $75  Spoke with Pharmacy to Process.  Patient Advocate Fax: [phone]

## 2021-11-05 ENCOUNTER — Other Ambulatory Visit: Payer: Self-pay | Admitting: Endocrinology

## 2021-11-05 ENCOUNTER — Inpatient Hospital Stay: Payer: Medicare Other

## 2021-11-05 ENCOUNTER — Inpatient Hospital Stay: Payer: Medicare Other | Admitting: Hematology and Oncology

## 2021-11-05 ENCOUNTER — Other Ambulatory Visit (HOSPITAL_COMMUNITY): Payer: Self-pay

## 2021-11-05 ENCOUNTER — Telehealth: Payer: Self-pay

## 2021-11-05 ENCOUNTER — Other Ambulatory Visit: Payer: Self-pay | Admitting: Internal Medicine

## 2021-11-05 ENCOUNTER — Telehealth: Payer: Self-pay | Admitting: Hematology and Oncology

## 2021-11-05 DIAGNOSIS — H16203 Unspecified keratoconjunctivitis, bilateral: Secondary | ICD-10-CM | POA: Diagnosis not present

## 2021-11-05 NOTE — Telephone Encounter (Signed)
Called and canceled appt for. Dr. Alvy Bimler is out of the office.  Scheduler will call her to reschedule.

## 2021-11-05 NOTE — Telephone Encounter (Signed)
Scheduled appointment per 01/26 los. Patient aware.

## 2021-11-06 ENCOUNTER — Other Ambulatory Visit: Payer: Self-pay | Admitting: Endocrinology

## 2021-11-06 MED ORDER — NOVOLOG FLEXPEN 100 UNIT/ML ~~LOC~~ SOPN: 15.0000 [IU] | PEN_INJECTOR | Freq: Every day | SUBCUTANEOUS | 11 refills | Status: DC

## 2021-11-13 ENCOUNTER — Inpatient Hospital Stay: Payer: BC Managed Care – PPO | Attending: Hematology and Oncology

## 2021-11-13 ENCOUNTER — Inpatient Hospital Stay (HOSPITAL_BASED_OUTPATIENT_CLINIC_OR_DEPARTMENT_OTHER): Payer: BC Managed Care – PPO | Admitting: Hematology and Oncology

## 2021-11-13 ENCOUNTER — Encounter: Payer: Self-pay | Admitting: Hematology and Oncology

## 2021-11-13 ENCOUNTER — Other Ambulatory Visit: Payer: Self-pay

## 2021-11-13 VITALS — BP 154/61 | HR 91 | Temp 97.9°F | Resp 18 | Ht 66.0 in | Wt 228.2 lb

## 2021-11-13 DIAGNOSIS — Z794 Long term (current) use of insulin: Secondary | ICD-10-CM | POA: Insufficient documentation

## 2021-11-13 DIAGNOSIS — D539 Nutritional anemia, unspecified: Secondary | ICD-10-CM | POA: Diagnosis not present

## 2021-11-13 DIAGNOSIS — E611 Iron deficiency: Secondary | ICD-10-CM | POA: Diagnosis not present

## 2021-11-13 DIAGNOSIS — D869 Sarcoidosis, unspecified: Secondary | ICD-10-CM | POA: Insufficient documentation

## 2021-11-13 DIAGNOSIS — Z888 Allergy status to other drugs, medicaments and biological substances status: Secondary | ICD-10-CM | POA: Diagnosis not present

## 2021-11-13 DIAGNOSIS — E1165 Type 2 diabetes mellitus with hyperglycemia: Secondary | ICD-10-CM | POA: Diagnosis not present

## 2021-11-13 DIAGNOSIS — I509 Heart failure, unspecified: Secondary | ICD-10-CM | POA: Diagnosis not present

## 2021-11-13 DIAGNOSIS — Z79899 Other long term (current) drug therapy: Secondary | ICD-10-CM | POA: Insufficient documentation

## 2021-11-13 DIAGNOSIS — Z7952 Long term (current) use of systemic steroids: Secondary | ICD-10-CM | POA: Insufficient documentation

## 2021-11-13 DIAGNOSIS — N183 Chronic kidney disease, stage 3 unspecified: Secondary | ICD-10-CM | POA: Insufficient documentation

## 2021-11-13 DIAGNOSIS — Z881 Allergy status to other antibiotic agents status: Secondary | ICD-10-CM | POA: Insufficient documentation

## 2021-11-13 DIAGNOSIS — E538 Deficiency of other specified B group vitamins: Secondary | ICD-10-CM | POA: Insufficient documentation

## 2021-11-13 DIAGNOSIS — R42 Dizziness and giddiness: Secondary | ICD-10-CM | POA: Diagnosis not present

## 2021-11-13 DIAGNOSIS — D508 Other iron deficiency anemias: Secondary | ICD-10-CM

## 2021-11-13 DIAGNOSIS — E1122 Type 2 diabetes mellitus with diabetic chronic kidney disease: Secondary | ICD-10-CM | POA: Diagnosis not present

## 2021-11-13 LAB — COMPREHENSIVE METABOLIC PANEL
ALT: 20 U/L (ref 0–44)
AST: 19 U/L (ref 15–41)
Albumin: 4.3 g/dL (ref 3.5–5.0)
Alkaline Phosphatase: 84 U/L (ref 38–126)
Anion gap: 6 (ref 5–15)
BUN: 24 mg/dL — ABNORMAL HIGH (ref 8–23)
CO2: 26 mmol/L (ref 22–32)
Calcium: 9.3 mg/dL (ref 8.9–10.3)
Chloride: 101 mmol/L (ref 98–111)
Creatinine, Ser: 1.11 mg/dL — ABNORMAL HIGH (ref 0.44–1.00)
GFR, Estimated: 56 mL/min — ABNORMAL LOW (ref >60)
Glucose, Bld: 436 mg/dL — ABNORMAL HIGH (ref 70–99)
Potassium: 4.8 mmol/L (ref 3.5–5.1)
Sodium: 133 mmol/L — ABNORMAL LOW (ref 135–145)
Total Bilirubin: 0.6 mg/dL (ref 0.3–1.2)
Total Protein: 7 g/dL (ref 6.5–8.1)

## 2021-11-13 LAB — CBC WITH DIFFERENTIAL/PLATELET
Abs Immature Granulocytes: 0.18 10*3/uL — ABNORMAL HIGH (ref 0.00–0.07)
Basophils Absolute: 0.1 10*3/uL (ref 0.0–0.1)
Basophils Relative: 1 %
Eosinophils Absolute: 0.5 10*3/uL (ref 0.0–0.5)
Eosinophils Relative: 5 %
HCT: 32.4 % — ABNORMAL LOW (ref 36.0–46.0)
Hemoglobin: 10.6 g/dL — ABNORMAL LOW (ref 12.0–15.0)
Immature Granulocytes: 2 %
Lymphocytes Relative: 22 %
Lymphs Abs: 2 10*3/uL (ref 0.7–4.0)
MCH: 30.5 pg (ref 26.0–34.0)
MCHC: 32.7 g/dL (ref 30.0–36.0)
MCV: 93.1 fL (ref 80.0–100.0)
Monocytes Absolute: 0.5 10*3/uL (ref 0.1–1.0)
Monocytes Relative: 6 %
Neutro Abs: 5.8 10*3/uL (ref 1.7–7.7)
Neutrophils Relative %: 64 %
Platelets: 282 10*3/uL (ref 150–400)
RBC: 3.48 MIL/uL — ABNORMAL LOW (ref 3.87–5.11)
RDW: 13.2 % (ref 11.5–15.5)
WBC: 9 10*3/uL (ref 4.0–10.5)
nRBC: 0 % (ref 0.0–0.2)

## 2021-11-13 LAB — IRON AND IRON BINDING CAPACITY (CC-WL,HP ONLY)
Iron: 86 ug/dL (ref 28–170)
Saturation Ratios: 19 % (ref 10.4–31.8)
TIBC: 445 ug/dL (ref 250–450)
UIBC: 359 ug/dL (ref 148–442)

## 2021-11-13 LAB — FERRITIN: Ferritin: 235 ng/mL (ref 11–307)

## 2021-11-13 LAB — SEDIMENTATION RATE: Sed Rate: 27 mm/hr — ABNORMAL HIGH (ref 0–22)

## 2021-11-13 NOTE — Assessment & Plan Note (Signed)
She has poorly controlled diabetes Her blood sugar is over 400 today I recommend the patient to consult with her endocrinologist for further management

## 2021-11-13 NOTE — Progress Notes (Signed)
[facility] OFFICE PROGRESS NOTE  [name], MD  ASSESSMENT & PLAN:  Deficiency anemia I have reviewed blood work with the patient  Overall, she has multifactorial anemia Previously, she was found to have combined iron and B12 deficiency She is receiving B12 supplements through her primary care doctor's office Repeat iron studies show adequate level of ferritin I will cancel her IV iron tomorrow I have a long time explaining to the patient that the cause of her fatigue and anemia is not related to nutritional deficiency, likely due to uncontrolled diabetes causing chronic kidney disease  CKD (chronic kidney disease) stage 3, GFR 30-59 ml/min (HCC) She has intermittent chronic kidney disease secondary to cardiac issues and diabetes She will continue aggressive risk factor modification There could be a component of anemia chronic kidney disease but with her current level of hemoglobin, she does not need ESA  Uncontrolled type 2 diabetes mellitus with hyperglycemia ([place]) She has poorly controlled diabetes Her blood sugar is over 400 today I recommend the patient to consult with her endocrinologist for further management  Orders Placed This Encounter  Procedures   Iron and Iron Binding Capacity ([facility] only)    Standing Status:   Future    Standing Expiration Date:   11/13/2022   Ferritin    Standing Status:   Future    Standing Expiration Date:   11/13/2022    The total time spent in the appointment was 20 minutes encounter with patients including review of chart and various tests results, discussions about plan of care and coordination of care plan   All questions were answered. The patient knows to call the clinic with any problems, questions or concerns. No barriers to learning was detected.    Heath Lark, MD 2/3/202311:42 AM  INTERVAL HISTORY: [name] 61 y.o. female returns for further follow-up for multifactorial anemia Her blood sugar  is noted to be high According to the patient, she feels fine except for excessive fatigue The patient denies any recent signs or symptoms of bleeding such as spontaneous epistaxis, hematuria or hematochezia.  SUMMARY OF HEMATOLOGIC HISTORY: She is here accompanied by her husband I have the opportunity to review her blood test results from 2015 The patient has mild anemia over the last few years She has multiple chronic disease such as sarcoidosis, diabetes, congestive heart failure and others.   She has history of fundoplication.  She has multiple EGDs in the past, the last was in 2019 which showed no evidence of stomach ulcer or gastritis She takes chronic proton pump inhibitor Her hemoglobin stable between 10-11, most consistent with anemia of chronic disease  Around Halloween this year, she fell requiring left leg surgery.  Unfortunately, she developed complications with infection requiring IV antibiotics She also require blood transfusion intermittently According to her husband, advanced home care service has been drawing her blood from her PICC line and noted hemoglobin between 7.2-7.7 She has excessive fatigue This past Sunday, she had postural dizziness leading to a fall but she denies significant injuries She denies recent chest pain on exertion but has been somewhat sedentary since her surgery She was on chronic prednisone therapy but that was discontinued in October.  She use inhalers. She denies shortness of breath on minimal exertions or palpitations. She had not noticed any recent bleeding such as epistaxis, hematuria or hematochezia The patient denies over the counter NSAID ingestion. She is on antiplatelets agents and Lovenox since surgery  She had no  prior history or diagnosis of cancer. Her age appropriate screening programs are up-to-date. She denies any pica and eats a variety of diet. She never donated blood  The patient was prescribed oral iron supplements and she takes  2 times a day and she tolerated that poorly.  She has significant nausea She received 2 doses of IV iron sucrose in January 2022 Starting in March, 2022, she started to receive B12 injections  I have reviewed the past medical history, past surgical history, social history and family history with the patient and they are unchanged from previous note.  ALLERGIES:  is allergic to methotrexate, vancomycin, lisinopril, chlorhexidine, clindamycin/lincomycin, doxycycline, and teflaro [ceftaroline].  MEDICATIONS:  Current Outpatient Medications  Medication Sig Dispense Refill   albuterol (PROVENTIL HFA;VENTOLIN HFA) 108 (90 Base) MCG/ACT inhaler Inhale 1-2 puffs into the lungs every 6 (six) hours as needed for wheezing or shortness of breath. 8 g 5   allopurinol (ZYLOPRIM) 300 MG tablet TAKE 1 TABLET BY MOUTH EVERY DAY 90 tablet 0   amoxicillin-clavulanate (AUGMENTIN) 875-125 MG tablet Take 1 tablet by mouth 2 (two) times daily. 20 tablet 0   aspirin EC 81 MG tablet Take 1 tablet (81 mg total) by mouth daily. 30 tablet 11   atorvastatin (LIPITOR) 40 MG tablet TAKE 1 TABLET BY MOUTH EVERY DAY 90 tablet 3   benzonatate (TESSALON PERLES) 100 MG capsule Take 1 capsule (100 mg total) by mouth 3 (three) times daily as needed for cough. 20 capsule 0   BREO ELLIPTA 200-25 MCG/INH AEPB TAKE 1 PUFF BY MOUTH EVERY DAY 60 each 5   buPROPion (WELLBUTRIN XL) 300 MG 24 hr tablet TAKE 1 TABLET BY MOUTH EVERY DAY 90 tablet 3   carvedilol (COREG) 12.5 MG tablet Take 12.5 mg by mouth 2 (two) times daily with a meal.     Cholecalciferol 25 MCG (1000 UT) capsule Take 1,000 Units by mouth in the morning and at bedtime.     diclofenac Sodium (VOLTAREN) 1 % GEL Apply 1 application topically as needed (pain.).      diltiazem (CARDIZEM CD) 120 MG 24 hr capsule Take 1 capsule (120 mg total) by mouth daily. 90 capsule 3   famotidine (PEPCID) 20 MG tablet Take 1 tablet (20 mg total) by mouth at bedtime. 30 tablet 3   fenofibrate  160 MG tablet TAKE 1 TABLET BY MOUTH EVERY DAY 90 tablet 3   fluticasone (FLONASE) 50 MCG/ACT nasal spray Place 2 sprays into both nostrils daily as needed for allergies. 48 mL 3   furosemide (LASIX) 40 MG tablet TAKE 1 TABLET BY MOUTH TWICE A DAY 180 tablet 1   gabapentin (NEURONTIN) 100 MG capsule TAKE 1 CAPSULE BY MOUTH THREE TIMES A DAY 270 capsule 3   glucose blood (CONTOUR NEXT TEST) test strip 1 each by Other route 4 (four) times daily. And lancets 4/day 400 each 3   insulin aspart (NOVOLOG FLEXPEN) 100 UNIT/ML FlexPen Inject 15 Units into the skin daily with supper. 15 mL 11   insulin glargine (LANTUS SOLOSTAR) 100 UNIT/ML Solostar Pen Inject 16 Units into the skin every morning. 30 mL 2   Insulin Pen Needle (PEN NEEDLES) 32G X 4 MM MISC 1 each by Does not apply route 4 (four) times daily. E11.9 400 each 0   ipratropium (ATROVENT) 0.03 % nasal spray PLACE 2 SPRAYS INTO BOTH NOSTRILS EVERY 12 (TWELVE) HOURS. 30 mL 2   KLOR-CON M20 20 MEQ tablet TAKE 1 & 1/2 TABLETS BY MOUTH  TWICE A DAY 270 tablet 2   lactulose (CHRONULAC) 10 GM/15ML solution TAKE 15 MLS BY MOUTH DAILY AS NEEDED FOR CONSTIPATION. 1419 mL 1   Lancet Devices (EASY MINI EJECT LANCING DEVICE) MISC Use as directed 4 times daily to test blood sugar 1 each 3   LINZESS 290 MCG CAPS capsule TAKE 1 CAPSULE BY MOUTH DAILY BEFORE BREAKFAST. 30 capsule 3   LORazepam (ATIVAN) 0.5 MG tablet Take 0.5 mg by mouth every 12 (twelve) hours as needed for anxiety.     meloxicam (MOBIC) 15 MG tablet Take 1 tablet (15 mg total) by mouth daily. 30 tablet 1   metFORMIN (GLUCOPHAGE-XR) 500 MG 24 hr tablet Take 4 tablets (2,000 mg total) by mouth daily with breakfast. 360 tablet 3   methocarbamol (ROBAXIN) 500 MG tablet Take 1 tablet (500 mg total) by mouth every 8 (eight) hours as needed for muscle spasms. 40 tablet 0   Microlet Lancets MISC 1 each by Does not apply route 2 (two) times daily. E11.9 100 each 2   nitroGLYCERIN (NITROSTAT) 0.4 MG SL  tablet Place 1 tablet (0.4 mg total) under the tongue every 5 (five) minutes as needed for chest pain. Reported on 01/05/2016 25 tablet 6   nystatin (MYCOSTATIN) 100000 UNIT/ML suspension nystatin 100,000 unit/mL oral suspension  SWISH, GARGLE, AND SPIT 20 ML 4 TIMES A DAY UNTIL RESOLVED     ondansetron (ZOFRAN-ODT) 4 MG disintegrating tablet Take 1 tablet (4 mg total) by mouth every 8 (eight) hours as needed for nausea or vomiting. 50 tablet 2   RABEprazole (ACIPHEX) 20 MG tablet Take 1 tablet (20 mg total) by mouth daily. 90 tablet 3   Semaglutide, 2 MG/DOSE, (OZEMPIC, 2 MG/DOSE,) 8 MG/3ML SOPN Inject 2 mg into the skin once a week. 9 mL 3   telmisartan (MICARDIS) 20 MG tablet TAKE 1 TABLET BY MOUTH EVERY DAY 90 tablet 1   venlafaxine XR (EFFEXOR-XR) 75 MG 24 hr capsule TAKE 1 CAPSULE BY MOUTH TWICE A DAY 180 capsule 3   No current facility-administered medications for this visit.     REVIEW OF SYSTEMS:   Constitutional: Denies fevers, chills or night sweats Eyes: Denies blurriness of vision Ears, nose, mouth, throat, and face: Denies mucositis or sore throat Respiratory: Denies cough, dyspnea or wheezes Cardiovascular: Denies palpitation, chest discomfort or lower extremity swelling Gastrointestinal:  Denies nausea, heartburn or change in bowel habits Skin: Denies abnormal skin rashes Lymphatics: Denies new lymphadenopathy or easy bruising Neurological:Denies numbness, tingling or new weaknesses Behavioral/Psych: Mood is stable, no new changes  All other systems were reviewed with the patient and are negative.  PHYSICAL EXAMINATION: ECOG PERFORMANCE STATUS: 1 - Symptomatic but completely ambulatory  Vitals:   11/13/21 1028  BP: (!) 154/61  Pulse: 91  Resp: 18  Temp: 97.9 F (36.6 C)  SpO2: 100%   Filed Weights   11/13/21 1028  Weight: 228 lb 3.2 oz (103.5 kg)    GENERAL:alert, no distress and comfortable  NEURO: alert & oriented x 3 with fluent speech, no focal  motor/sensory deficits  LABORATORY DATA:  I have reviewed the data as listed     Component Value Date/Time   NA 133 (L) 11/13/2021 1003   NA 139 10/20/2020 0000   NA 135 (L) 04/17/2014 1039   K 4.8 11/13/2021 1003   K 4.5 04/17/2014 1039   CL 101 11/13/2021 1003   CO2 26 11/13/2021 1003   CO2 26 04/17/2014 1039   GLUCOSE 436 (H) 11/13/2021  1003   GLUCOSE 300 (H) 04/17/2014 1039   GLUCOSE 150 (H) 09/14/2006 1033   BUN 24 (H) 11/13/2021 1003   BUN 10 10/20/2020 0000   BUN 13.9 04/17/2014 1039   CREATININE 1.11 (H) 11/13/2021 1003   CREATININE 0.88 08/21/2020 1404   CREATININE 0.8 04/17/2014 1039   CALCIUM 9.3 11/13/2021 1003   CALCIUM 10.0 04/17/2014 1039   PROT 7.0 11/13/2021 1003   PROT 6.7 03/31/2020 1316   PROT 6.8 04/17/2014 1039   ALBUMIN 4.3 11/13/2021 1003   ALBUMIN 4.6 03/31/2020 1316   ALBUMIN 3.7 04/17/2014 1039   AST 19 11/13/2021 1003   AST 8 04/17/2014 1039   ALT 20 11/13/2021 1003   ALT 20 04/17/2014 1039   ALKPHOS 84 11/13/2021 1003   ALKPHOS 82 04/17/2014 1039   BILITOT 0.6 11/13/2021 1003   BILITOT 0.3 03/31/2020 1316   BILITOT 0.45 04/17/2014 1039   GFRNONAA 56 (L) 11/13/2021 1003   GFRNONAA 71 08/21/2020 1404   GFRAA 96 10/20/2020 0000   GFRAA 82 08/21/2020 1404    No results found for: SPEP, UPEP  Lab Results  Component Value Date   WBC 9.0 11/13/2021   NEUTROABS 5.8 11/13/2021   HGB 10.6 (L) 11/13/2021   HCT 32.4 (L) 11/13/2021   MCV 93.1 11/13/2021   PLT 282 11/13/2021      Chemistry      Component Value Date/Time   NA 133 (L) 11/13/2021 1003   NA 139 10/20/2020 0000   NA 135 (L) 04/17/2014 1039   K 4.8 11/13/2021 1003   K 4.5 04/17/2014 1039   CL 101 11/13/2021 1003   CO2 26 11/13/2021 1003   CO2 26 04/17/2014 1039   BUN 24 (H) 11/13/2021 1003   BUN 10 10/20/2020 0000   BUN 13.9 04/17/2014 1039   CREATININE 1.11 (H) 11/13/2021 1003   CREATININE 0.88 08/21/2020 1404   CREATININE 0.8 04/17/2014 1039   GLU 223 10/20/2020  0000      Component Value Date/Time   CALCIUM 9.3 11/13/2021 1003   CALCIUM 10.0 04/17/2014 1039   ALKPHOS 84 11/13/2021 1003   ALKPHOS 82 04/17/2014 1039   AST 19 11/13/2021 1003   AST 8 04/17/2014 1039   ALT 20 11/13/2021 1003   ALT 20 04/17/2014 1039   BILITOT 0.6 11/13/2021 1003   BILITOT 0.3 03/31/2020 1316   BILITOT 0.45 04/17/2014 1039

## 2021-11-13 NOTE — Assessment & Plan Note (Signed)
I have reviewed blood work with the patient  Overall, she has multifactorial anemia Previously, she was found to have combined iron and B12 deficiency She is receiving B12 supplements through her primary care doctor's office Repeat iron studies show adequate level of ferritin I will cancel her IV iron tomorrow I have a long time explaining to the patient that the cause of her fatigue and anemia is not related to nutritional deficiency, likely due to uncontrolled diabetes causing chronic kidney disease

## 2021-11-13 NOTE — Assessment & Plan Note (Signed)
She has intermittent chronic kidney disease secondary to cardiac issues and diabetes She will continue aggressive risk factor modification There could be a component of anemia chronic kidney disease but with her current level of hemoglobin, she does not need ESA

## 2021-11-14 ENCOUNTER — Inpatient Hospital Stay: Payer: BC Managed Care – PPO

## 2021-11-17 ENCOUNTER — Other Ambulatory Visit: Payer: Self-pay | Admitting: Endocrinology

## 2021-11-17 ENCOUNTER — Emergency Department (HOSPITAL_BASED_OUTPATIENT_CLINIC_OR_DEPARTMENT_OTHER): Payer: BC Managed Care – PPO

## 2021-11-17 ENCOUNTER — Emergency Department (HOSPITAL_BASED_OUTPATIENT_CLINIC_OR_DEPARTMENT_OTHER)
Admission: EM | Admit: 2021-11-17 | Discharge: 2021-11-18 | Disposition: A | Discharge status: Home / Self Care | Payer: BC Managed Care – PPO | Attending: Emergency Medicine | Admitting: Emergency Medicine

## 2021-11-17 ENCOUNTER — Other Ambulatory Visit: Payer: Self-pay

## 2021-11-17 ENCOUNTER — Encounter (HOSPITAL_BASED_OUTPATIENT_CLINIC_OR_DEPARTMENT_OTHER): Payer: Self-pay

## 2021-11-17 DIAGNOSIS — R109 Unspecified abdominal pain: Secondary | ICD-10-CM | POA: Diagnosis not present

## 2021-11-17 DIAGNOSIS — K760 Fatty (change of) liver, not elsewhere classified: Secondary | ICD-10-CM | POA: Insufficient documentation

## 2021-11-17 DIAGNOSIS — D72829 Elevated white blood cell count, unspecified: Secondary | ICD-10-CM | POA: Insufficient documentation

## 2021-11-17 DIAGNOSIS — N289 Disorder of kidney and ureter, unspecified: Secondary | ICD-10-CM

## 2021-11-17 DIAGNOSIS — I700 Atherosclerosis of aorta: Secondary | ICD-10-CM | POA: Diagnosis not present

## 2021-11-17 DIAGNOSIS — Z7982 Long term (current) use of aspirin: Secondary | ICD-10-CM | POA: Insufficient documentation

## 2021-11-17 DIAGNOSIS — D509 Iron deficiency anemia, unspecified: Secondary | ICD-10-CM | POA: Diagnosis not present

## 2021-11-17 DIAGNOSIS — K5730 Diverticulosis of large intestine without perforation or abscess without bleeding: Secondary | ICD-10-CM | POA: Diagnosis not present

## 2021-11-17 DIAGNOSIS — R1013 Epigastric pain: Secondary | ICD-10-CM | POA: Diagnosis not present

## 2021-11-17 DIAGNOSIS — D649 Anemia, unspecified: Secondary | ICD-10-CM

## 2021-11-17 LAB — COMPREHENSIVE METABOLIC PANEL
ALT: 16 U/L (ref 0–44)
AST: 16 U/L (ref 15–41)
Albumin: 4.9 g/dL (ref 3.5–5.0)
Alkaline Phosphatase: 64 U/L (ref 38–126)
Anion gap: 12 (ref 5–15)
BUN: 27 mg/dL — ABNORMAL HIGH (ref 8–23)
CO2: 26 mmol/L (ref 22–32)
Calcium: 9.9 mg/dL (ref 8.9–10.3)
Chloride: 97 mmol/L — ABNORMAL LOW (ref 98–111)
Creatinine, Ser: 1.35 mg/dL — ABNORMAL HIGH (ref 0.44–1.00)
GFR, Estimated: 44 mL/min — ABNORMAL LOW (ref >60)
Glucose, Bld: 201 mg/dL — ABNORMAL HIGH (ref 70–99)
Potassium: 4.7 mmol/L (ref 3.5–5.1)
Sodium: 135 mmol/L (ref 135–145)
Total Bilirubin: 0.7 mg/dL (ref 0.3–1.2)
Total Protein: 7.7 g/dL (ref 6.5–8.1)

## 2021-11-17 LAB — URINALYSIS, ROUTINE W REFLEX MICROSCOPIC
Bilirubin Urine: NEGATIVE
Glucose, UA: NEGATIVE mg/dL
Hgb urine dipstick: NEGATIVE
Ketones, ur: NEGATIVE mg/dL
Nitrite: NEGATIVE
Protein, ur: NEGATIVE mg/dL
Specific Gravity, Urine: 1.016 (ref 1.005–1.030)
pH: 5 (ref 5.0–8.0)

## 2021-11-17 LAB — CBC
HCT: 36 % (ref 36.0–46.0)
Hemoglobin: 11.8 g/dL — ABNORMAL LOW (ref 12.0–15.0)
MCH: 29.9 pg (ref 26.0–34.0)
MCHC: 32.8 g/dL (ref 30.0–36.0)
MCV: 91.4 fL (ref 80.0–100.0)
Platelets: 380 10*3/uL (ref 150–400)
RBC: 3.94 MIL/uL (ref 3.87–5.11)
RDW: 13.1 % (ref 11.5–15.5)
WBC: 16 10*3/uL — ABNORMAL HIGH (ref 4.0–10.5)
nRBC: 0 % (ref 0.0–0.2)

## 2021-11-17 LAB — LIPASE, BLOOD: Lipase: 11 U/L (ref 11–51)

## 2021-11-17 MED ORDER — IOHEXOL 300 MG/ML  SOLN
100.0000 mL | Freq: Once | INTRAMUSCULAR | Status: AC | PRN
  Administered 2021-11-17: 75 mL via INTRAVENOUS

## 2021-11-17 MED ORDER — LACTATED RINGERS IV SOLN: INTRAVENOUS | Status: DC

## 2021-11-17 MED ORDER — MORPHINE SULFATE (PF) 4 MG/ML IV SOLN
6.0000 mg | Freq: Once | INTRAVENOUS | Status: AC
  Administered 2021-11-17: 6 mg via INTRAVENOUS
  Filled 2021-11-17: qty 2

## 2021-11-17 MED ORDER — METOCLOPRAMIDE HCL 10 MG PO TABS: 10.0000 mg | ORAL_TABLET | Freq: Four times a day (QID) | ORAL | 0 refills | Status: DC

## 2021-11-17 MED ORDER — ONDANSETRON 4 MG PO TBDP: 4.0000 mg | ORAL_TABLET | Freq: Three times a day (TID) | ORAL | 0 refills | Status: DC | PRN

## 2021-11-17 MED ORDER — LACTATED RINGERS IV BOLUS
1000.0000 mL | Freq: Once | INTRAVENOUS | Status: AC
  Administered 2021-11-17: 1000 mL via INTRAVENOUS

## 2021-11-17 MED ORDER — HYDROCODONE-ACETAMINOPHEN 5-325 MG PO TABS: 1.0000 | ORAL_TABLET | ORAL | 0 refills | Status: DC | PRN

## 2021-11-17 MED ORDER — METOCLOPRAMIDE HCL 5 MG/ML IJ SOLN
5.0000 mg | Freq: Once | INTRAMUSCULAR | Status: AC
  Administered 2021-11-17: 5 mg via INTRAVENOUS
  Filled 2021-11-17: qty 2

## 2021-11-17 NOTE — ED Triage Notes (Signed)
Patient here POV from Home with ABD Pain.  Patient states Pain is Mid-ABD and began Sunday. Pain in Constant but wanes in Intensity. Pain is non-radiating and Sharp in [place].  Moderate Nausea. Moderate Emesis. Mild Diarrhea today. No Urinary Symptoms. No Fevers.  NAD Noted during Triage. A&Ox4. GCS 15. Ambulatory.

## 2021-11-17 NOTE — ED Provider Notes (Signed)
[facility] EMERGENC[facility] DEPT Provider Note   CSN: [id number] Arrival date & time: 11/17/21  1942     History  Chief Complaint  Patient presents with   Abdominal Pain    [name] is a 61 y.o. female.  61 year old female presents with several days of abdominal pain.  Pain has been epigastric and nonradiating.  She has had nausea but no vomiting.  Does have a history of a Nissen fundoplication due to severe acid reflux.  No fever or chills.  No diarrhea.  Has been using Zofran without relief.  No urinary symptoms.      Home Medications Prior to Admission medications   Medication Sig Start Date End Date Taking? Authorizing Provider  albuterol (PROVENTIL HFA;VENTOLIN HFA) 108 (90 Base) MCG/ACT inhaler Inhale 1-2 puffs into the lungs every 6 (six) hours as needed for wheezing or shortness of breath. 01/04/19   [name], MD  allopurinol (ZYLOPRIM) 300 MG tablet TAKE 1 TABLET BY MOUTH EVERY DAY 09/07/21   Marybelle Killings, MD  amoxicillin-clavulanate (AUGMENTIN) 875-125 MG tablet Take 1 tablet by mouth 2 (two) times daily. 09/22/21   Tawnya Crook, MD  aspirin EC 81 MG tablet Take 1 tablet (81 mg total) by mouth daily. 11/03/20   Leonie Man, MD  atorvastatin (LIPITOR) 40 MG tablet TAKE 1 TABLET BY MOUTH EVERY DAY 08/31/21   [name], MD  benzonatate (TESSALON PERLES) 100 MG capsule Take 1 capsule (100 mg total) by mouth 3 (three) times daily as needed for cough. 09/22/21   Tawnya Crook, MD  BREO ELLIPTA [id number] MCG/INH AEPB TAKE 1 PUFF BY MOUTH EVERY DAY 06/04/19   [name], MD  buPROPion (WELLBUTRIN XL) 300 MG 24 hr tablet TAKE 1 TABLET BY MOUTH EVERY DAY 01/12/21   [name], MD  carvedilol (COREG) 12.5 MG tablet Take 12.5 mg by mouth 2 (two) times daily with a meal.    [provider]  Cholecalciferol 25 MCG (1000 UT) capsule Take 1,000 Units by mouth in the morning and at bedtime. 10/26/11   [provider]   diclofenac Sodium (VOLTAREN) 1 % GEL Apply 1 application topically as needed (pain.).     [provider]  diltiazem (CARDIZEM CD) 120 MG 24 hr capsule Take 1 capsule (120 mg total) by mouth daily. 01/27/21   Leonie Man, MD  famotidine (PEPCID) 20 MG tablet Take 1 tablet (20 mg total) by mouth at bedtime. 12/13/18   Willia Craze, NP  fenofibrate 160 MG tablet TAKE 1 TABLET BY MOUTH EVERY DAY 12/29/20   Leonie Man, MD  fluticasone [facility]) 50 MCG/ACT nasal spray Place 2 sprays into both nostrils daily as needed for allergies. 01/22/20   [name], MD  furosemide (LASIX) 40 MG tablet TAKE 1 TABLET BY MOUTH TWICE A DAY 06/16/21   [name], MD  gabapentin (NEURONTIN) 100 MG capsule TAKE 1 CAPSULE BY MOUTH THREE TIMES A DAY 01/21/21   [name], MD  glucose blood (CONTOUR NEXT TEST) test strip 1 each by Other route 4 (four) times daily. And lancets 4/day 09/19/19   Renato Shin, MD  insulin aspart (NOVOLOG FLEXPEN) 100 UNIT/ML FlexPen Inject 15 Units into the skin daily with supper. 11/06/21   Renato Shin, MD  insulin glargine (LANTUS SOLOSTAR) 100 UNIT/ML Solostar Pen Inject 16 Units into the skin every morning. 07/30/21   Renato Shin, MD  Insulin Pen Needle (PEN NEEDLES) 32G  X 4 MM MISC 1 each by Does not apply route 4 (four) times daily. E11.9 02/22/20   Renato Shin, MD  ipratropium (ATROVENT) 0.03 % nasal spray PLACE 2 SPRAYS INTO BOTH NOSTRILS EVERY 12 (TWELVE) HOURS. 03/11/21   [name], MD  KLOR-CON M20 20 MEQ tablet TAKE 1 & 1/2 TABLETS BY MOUTH TWICE A DAY 07/09/21   [name], MD  lactulose (CHRONULAC) 10 GM/15ML solution TAKE 15 MLS BY MOUTH DAILY AS NEEDED FOR CONSTIPATION. 10/07/20   [name], MD  Lancet Devices (EASY MINI EJECT LANCING DEVICE) MISC Use as directed 4 times daily to test blood sugar 11/04/20   Renato Shin, MD  LINZESS 290 MCG CAPS capsule TAKE 1 CAPSULE BY MOUTH DAILY BEFORE BREAKFAST. 09/07/21   Irene Shipper, MD  LORazepam (ATIVAN) 0.5 MG tablet Take 0.5 mg by mouth every 12 (twelve) hours as needed for anxiety.    [provider]  meloxicam (MOBIC) 15 MG tablet Take 1 tablet (15 mg total) by mouth daily. 06/24/21   Marybelle Killings, MD  metFORMIN (GLUCOPHAGE-XR) 500 MG 24 hr tablet Take 4 tablets (2,000 mg total) by mouth daily with breakfast. 04/29/21   Renato Shin, MD  methocarbamol (ROBAXIN) 500 MG tablet Take 1 tablet (500 mg total) by mouth every 8 (eight) hours as needed for muscle spasms. 05/27/21   Lanae Crumbly, PA-C  Microlet Lancets MISC 1 each by Does not apply route 2 (two) times daily. E11.9 09/19/19   Renato Shin, MD  nitroGLYCERIN (NITROSTAT) 0.4 MG SL tablet Place 1 tablet (0.4 mg total) under the tongue every 5 (five) minutes as needed for chest pain. Reported on 01/05/2016 01/10/18   Leonie Man, MD  nystatin (MYCOSTATIN) 100000 UNIT/ML suspension nystatin 100,000 unit/mL oral suspension  SWISH, GARGLE, AND SPIT 20 ML 4 TIMES A DAY UNTIL RESOLVED    [provider]  ondansetron (ZOFRAN-ODT) 4 MG disintegrating tablet Take 1 tablet (4 mg total) by mouth every 8 (eight) hours as needed for nausea or vomiting. 11/17/21   Renato Shin, MD  RABEprazole (ACIPHEX) 20 MG tablet Take 1 tablet (20 mg total) by mouth daily. 04/20/21   [name], MD  Semaglutide, 2 MG/DOSE, (OZEMPIC, 2 MG/DOSE,) 8 MG/3ML SOPN Inject 2 mg into the skin once a week. 11/02/21   Renato Shin, MD  telmisartan (MICARDIS) 20 MG tablet TAKE 1 TABLET BY MOUTH EVERY DAY 07/09/21   [name], MD  venlafaxine XR (EFFEXOR-XR) 75 MG 24 hr capsule TAKE 1 CAPSULE BY MOUTH TWICE A DAY 01/19/21   [name], MD  mupirocin nasal ointment (BACTROBAN) 2 % Place 1 application into the nose 2 (two) times daily. Use one-half of tube in each nostril twice daily for five (5) days. After application, press sides of nose together and gently massage. 03/28/18 03/28/18  [name], MD       Allergies    Methotrexate, Vancomycin, Lisinopril, Chlorhexidine, Clindamycin/lincomycin, Doxycycline, and Teflaro [ceftaroline]    Review of Systems   Review of Systems  All other systems reviewed and are negative.  Physical Exam Updated Vital Signs BP (!) 160/81 (BP Location: Right Arm)    Pulse 83    Temp 97.7 F (36.5 C)    Resp 19    Ht 1.676 m (5\' 6" )    Wt 103.5 kg    SpO2 98%    BMI 36.83 kg/m  Physical Exam Vitals and nursing note reviewed.  Constitutional:      General: She is not in acute distress.    Appearance: Normal appearance. She is well-developed. She is not toxic-appearing.  HENT:     Head: Normocephalic and atraumatic.  Eyes:     General: Lids are normal.     Conjunctiva/sclera: Conjunctivae normal.     Pupils: Pupils are equal, round, and reactive to light.  Neck:     Thyroid: No thyroid mass.     Trachea: No tracheal deviation.  Cardiovascular:     Rate and Rhythm: Normal rate and regular rhythm.     Heart sounds: Normal heart sounds. No murmur heard.   No gallop.  Pulmonary:     Effort: Pulmonary effort is normal. No respiratory distress.     Breath sounds: Normal breath sounds. No stridor. No decreased breath sounds, wheezing, rhonchi or rales.  Abdominal:     General: There is distension.     Palpations: Abdomen is soft.     Tenderness: There is abdominal tenderness in the epigastric area. There is guarding. There is no rebound.  Musculoskeletal:        General: No tenderness. Normal range of motion.     Cervical back: Normal range of motion and neck supple.  Skin:    General: Skin is warm and dry.     Findings: No abrasion or rash.  Neurological:     Mental Status: She is alert and oriented to person, place, and time. Mental status is at baseline.     GCS: GCS eye subscore is 4. GCS verbal subscore is 5. GCS motor subscore is 6.     Cranial Nerves: No cranial nerve deficit.     Sensory: No sensory deficit.     Motor: Motor function is  intact.  Psychiatric:        Attention and Perception: Attention normal.        Speech: Speech normal.        Behavior: Behavior normal.    ED Results / Procedures / Treatments   Labs (all labs ordered are listed, but only abnormal results are displayed) Labs Reviewed  COMPREHENSIVE METABOLIC PANEL - Abnormal; Notable for the following components:      Result Value   Chloride 97 (*)    Glucose, Bld 201 (*)    BUN 27 (*)    Creatinine, Ser 1.35 (*)    GFR, Estimated 44 (*)    All other components within normal limits  CBC - Abnormal; Notable for the following components:   WBC 16.0 (*)    Hemoglobin 11.8 (*)    All other components within normal limits  URINALYSIS, ROUTINE W REFLEX MICROSCOPIC - Abnormal; Notable for the following components:   Leukocytes,Ua SMALL (*)    Bacteria, UA RARE (*)    All other components within normal limits  LIPASE, BLOOD    EKG None  Radiology No results found.  Procedures Procedures    Medications Ordered in ED Medications  lactated ringers bolus 1,000 mL (has no administration in time range)  lactated ringers infusion (has no administration in time range)  metoCLOPramide (REGLAN) injection 5 mg (has no administration in time range)  morphine (PF) 4 MG/ML injection 6 mg (has no administration in time range)    ED Course/ Medical Decision Making/ A&P                           Medical Decision Making Amount and/or Complexity of  Data Reviewed Labs: ordered. Radiology: ordered.  Risk Prescription drug management.   Patient's labs significant for leukocytosis.  Concern for possible intra-abdominal infection versus bowel obstruction.  Will give IV fluids here as patient is clinically dehydrated.  Also treat with antiemetics as well as IV opiate medications.  Abdominal CT has been ordered at this time.  Will sign out to next provider, Dr. Roxanne Mins        Final Clinical Impression(s) / ED Diagnoses Final diagnoses:  None     Rx / DC Orders ED Discharge Orders     None         Lacretia Leigh, MD 11/17/21 2230

## 2021-11-17 NOTE — Discharge Instructions (Signed)
Please take your rabeprazole (Aciphex) twice a day for the next 2 weeks.  Return if symptoms are getting worse, or are not improving after 24-48 hours.

## 2021-11-17 NOTE — ED Provider Notes (Signed)
Care assumed from Dr. Zenia Resides, patient with abdominal pain and leukocytosis pending CT of abdomen and pelvis.  CT scan shows no acute process.  I have independently viewed the images, and agree with radiologist's interpretation.  Patient states that nausea is better following metoclopramide, pain is better following morphine.  Symptoms had started after taking her first dose of Ozempic.  Abdominal pain and nausea are known side effects of that medication might be the cause of her problem.  On reexam, abdomen is soft with only minimal epigastric tenderness.  She is felt to be safe for discharge.  She is given a prescription for metoclopramide and a small number of hydrocodone-acetaminophen tablets.  Advised to return if pain persists or is getting worse.  Otherwise, follow-up with PCP.  Results for orders placed or performed during the hospital encounter of 11/17/21  Lipase, blood  Result Value Ref Range   Lipase 11 11 - 51 U/L  Comprehensive metabolic panel  Result Value Ref Range   Sodium 135 135 - 145 mmol/L   Potassium 4.7 3.5 - 5.1 mmol/L   Chloride 97 (L) 98 - 111 mmol/L   CO2 26 22 - 32 mmol/L   Glucose, Bld 201 (H) 70 - 99 mg/dL   BUN 27 (H) 8 - 23 mg/dL   Creatinine, Ser 1.35 (H) 0.44 - 1.00 mg/dL   Calcium 9.9 8.9 - 10.3 mg/dL   Total Protein 7.7 6.5 - 8.1 g/dL   Albumin 4.9 3.5 - 5.0 g/dL   AST 16 15 - 41 U/L   ALT 16 0 - 44 U/L   Alkaline Phosphatase 64 38 - 126 U/L   Total Bilirubin 0.7 0.3 - 1.2 mg/dL   GFR, Estimated 44 (L) >60 mL/min   Anion gap 12 5 - 15  CBC  Result Value Ref Range   WBC 16.0 (H) 4.0 - 10.5 K/uL   RBC 3.94 3.87 - 5.11 MIL/uL   Hemoglobin 11.8 (L) 12.0 - 15.0 g/dL   HCT 36.0 36.0 - 46.0 %   MCV 91.4 80.0 - 100.0 fL   MCH 29.9 26.0 - 34.0 pg   MCHC 32.8 30.0 - 36.0 g/dL   RDW 13.1 11.5 - 15.5 %   Platelets 380 150 - 400 K/uL   nRBC 0.0 0.0 - 0.2 %  Urinalysis, Routine w reflex microscopic Urine, Clean Catch  Result Value Ref Range   Color, Urine  YELLOW YELLOW   APPearance CLEAR CLEAR   Specific Gravity, Urine 1.016 1.005 - 1.030   pH 5.0 5.0 - 8.0   Glucose, UA NEGATIVE NEGATIVE mg/dL   Hgb urine dipstick NEGATIVE NEGATIVE   Bilirubin Urine NEGATIVE NEGATIVE   Ketones, ur NEGATIVE NEGATIVE mg/dL   Protein, ur NEGATIVE NEGATIVE mg/dL   Nitrite NEGATIVE NEGATIVE   Leukocytes,Ua SMALL (A) NEGATIVE   WBC, UA 0-5 0 - 5 WBC/hpf   Bacteria, UA RARE (A) NONE SEEN   Squamous Epithelial / LPF 0-5 0 - 5   Mucus PRESENT    Hyaline Casts, UA PRESENT    *Note: Due to a large number of results and/or encounters for the requested time period, some results have not been displayed. A complete set of results can be found in Results Review.   CT Abdomen Pelvis W Contrast  Result Date: 11/17/2021 CLINICAL DATA:  Abdomen pain EXAM: CT ABDOMEN AND PELVIS WITH CONTRAST TECHNIQUE: Multidetector CT imaging of the abdomen and pelvis was performed using the standard protocol following bolus administration of intravenous contrast. RADIATION  DOSE REDUCTION: This exam was performed according to the departmental dose-optimization program which includes automated exposure control, adjustment of the mA and/or kV according to patient size and/or use of iterative reconstruction technique. CONTRAST:  60mL OMNIPAQUE IOHEXOL 300 MG/ML  SOLN COMPARISON:  CT 02/11/2020 FINDINGS: Lower chest: Lung bases demonstrate no acute consolidation or effusion. Normal cardiac size. Hepatobiliary: Hepatic steatosis. Status post cholecystectomy. No biliary dilatation Pancreas: Unremarkable. No pancreatic ductal dilatation or surrounding inflammatory changes. Spleen: Borderline to slightly enlarged measuring 14 cm craniocaudad Adrenals/Urinary Tract: Adrenal glands are unremarkable. Kidneys are normal, without renal calculi, focal lesion, or hydronephrosis. Bladder is unremarkable. Stomach/Bowel: Stomach nonenlarged. No dilated small bowel. No acute bowel wall thickening. Mild diverticular  disease of the sigmoid colon Vascular/Lymphatic: Mild aortic atherosclerosis. No aneurysm. No suspicious lymph nodes Reproductive: Status post hysterectomy. No adnexal masses. Other: Negative for pelvic effusion or free air. Evidence of prior ventral hernia repair Musculoskeletal: No acute osseous abnormality. IMPRESSION: 1. No CT evidence for acute intra-abdominal or pelvic abnormality. 2. Hepatic steatosis 3. Mild diverticular disease of the sigmoid colon without acute inflammatory process. Electronically Signed   By: Donavan Foil M.D.   On: 32/20/2542 70:62      Delora Fuel, MD 37/62/83 2348

## 2021-11-18 DIAGNOSIS — S46011A Strain of muscle(s) and tendon(s) of the rotator cuff of right shoulder, initial encounter: Secondary | ICD-10-CM | POA: Diagnosis not present

## 2021-11-18 DIAGNOSIS — M67813 Other specified disorders of tendon, right shoulder: Secondary | ICD-10-CM | POA: Diagnosis not present

## 2021-11-18 DIAGNOSIS — M7551 Bursitis of right shoulder: Secondary | ICD-10-CM | POA: Diagnosis not present

## 2021-11-18 DIAGNOSIS — X58XXXA Exposure to other specified factors, initial encounter: Secondary | ICD-10-CM | POA: Diagnosis not present

## 2021-11-18 NOTE — ED Notes (Signed)
EMT-P provided AVS using Teachback Method. Patient verbalizes understanding of Discharge Instructions. Opportunity for Questioning and Answers were provided by EMT-P. Patient Discharged from ED.  ? ?

## 2021-11-23 ENCOUNTER — Ambulatory Visit: Payer: 59 | Admitting: Family Medicine

## 2021-11-27 DIAGNOSIS — M7551 Bursitis of right shoulder: Secondary | ICD-10-CM | POA: Diagnosis not present

## 2021-12-02 DIAGNOSIS — M75101 Unspecified rotator cuff tear or rupture of right shoulder, not specified as traumatic: Secondary | ICD-10-CM | POA: Diagnosis not present

## 2021-12-03 ENCOUNTER — Encounter: Payer: Self-pay | Admitting: Endocrinology

## 2021-12-03 ENCOUNTER — Other Ambulatory Visit: Payer: Self-pay

## 2021-12-03 ENCOUNTER — Ambulatory Visit (INDEPENDENT_AMBULATORY_CARE_PROVIDER_SITE_OTHER): Payer: BC Managed Care – PPO | Admitting: Endocrinology

## 2021-12-03 VITALS — BP 158/80 | HR 90 | Ht 66.0 in | Wt 226.0 lb

## 2021-12-03 DIAGNOSIS — I279 Pulmonary heart disease, unspecified: Secondary | ICD-10-CM | POA: Diagnosis not present

## 2021-12-03 DIAGNOSIS — E1129 Type 2 diabetes mellitus with other diabetic kidney complication: Secondary | ICD-10-CM

## 2021-12-03 DIAGNOSIS — Z794 Long term (current) use of insulin: Secondary | ICD-10-CM | POA: Diagnosis not present

## 2021-12-03 DIAGNOSIS — I517 Cardiomegaly: Secondary | ICD-10-CM | POA: Diagnosis not present

## 2021-12-03 LAB — POCT GLYCOSYLATED HEMOGLOBIN (HGB A1C): Hemoglobin A1C: 8.7 % — AB (ref 4.0–5.6)

## 2021-12-03 MED ORDER — OZEMPIC (0.25 OR 0.5 MG/DOSE) 2 MG/1.5ML ~~LOC~~ SOPN: 0.5000 mg | PEN_INJECTOR | SUBCUTANEOUS | 3 refills | Status: DC

## 2021-12-03 NOTE — Patient Instructions (Signed)
I have sent a prescription to your pharmacy, for a lower amount of Ozempic.  Start with 0.25 mg per week.  Then increase as you can, back up to 2 mg.   Please continue the same other medications.  check your blood sugar 4 times a day: before the 3 meals, and at bedtime.  also check if you have symptoms of your blood sugar being too high or too low.  please keep a record of the readings and bring it to your next appointment here (or you can bring the meter itself).  You can write it on any piece of paper.  please call us sooner if your blood sugar goes below 70, or if you have a lot of readings over 200.   Please come back for a follow-up appointment in 2 months.

## 2021-12-03 NOTE — Progress Notes (Signed)
Subjective:    Patient ID: [name], female    DOB: [birth year], 61 y.o.   MRN: [id number]  HPI Pt returns for f/u of diabetes mellitus:  DM type: Insulin-requiring type 2 Dx'ed: 1964 Complications: PN and mild CAD.  Therapy: insulin since 2297, Trulicity, and metformin.   GDM: never DKA: never Severe hypoglycemia: never Pancreatitis: never Pancreatic imaging: CT (2015) showed fatty atrophy.  Other: she takes 2 QD insulins, after poor results with multiple daily injections; she intermittently takes prednisone for sarcoidosis or AB.   Interval history: no recent steroids. She takes meds as rx'ed.  no cbg record, but states cbg's are still in the mid-100's. Pt says cbg's are worse because she can't get Trulicity.  She was able to obtain Ozempic, but 2 mg caused N/V.  Past Medical History:  Diagnosis Date   Abnormal SPEP 04/17/2014   Acute left ankle pain 01/26/2017   ANEMIA-UNSPECIFIED 09/18/2009   Anxiety    Arthritis    Blood transfusion without reported diagnosis    Cataract    CHF (congestive heart failure) (HCC)    Chronic diastolic heart failure, NYHA class 2 (HCC)    Normal LVEDP by May 2018   COPD (chronic obstructive pulmonary disease) ([place])    Depression    DIABETES MELLITUS, TYPE II 08/21/2006   Diabetic osteomyelitis ([place]) 05/29/2015   Fatty liver    Fracture of 5th metatarsal    non union   GERD 98/92/1194   Had fundoplication   GOUT 17/40/8144   Hammer toe of right foot    2-5th toes   Hx of umbilical hernia repair    HYPERLIPIDEMIA 08/21/2006   HYPERTENSION 08/21/2006   Infection of wound due to methicillin resistant Staphylococcus aureus (MRSA)    Internal hemorrhoids    Kidney problem    Multiple allergies 10/14/2016   OBESITY 06/04/2009   Onychomycosis 10/27/2015   Osteomyelitis of left foot ([place]) 05/29/2015   Osteoporosis    Pulmonary sarcoidosis ([place])    Followed locally by pulmonology, but also by Dr. Casper Harrison at [facility]   Right  knee pain 01/26/2017   Vocal cord dysfunction    Wears partial dentures     Past Surgical History:  Procedure Laterality Date   ABDOMINAL HYSTERECTOMY     APPENDECTOMY     BLADDER SUSPENSION  11/11/2011   Procedure: TRANSVAGINAL TAPE (TVT) PROCEDURE;  Surgeon: Olga Millers, MD;  Location: [facility];  Service: Gynecology;  Laterality: N/A;   CAROTIDS  02/18/11   CAROTID DUPLEX; VERTEBRALS ARE PATENT WITH ANTEGRADE FLOW. ICA/CCA RATIO 1.61 ON RIGHT AND 0.75 ON LEFT   CATARACT EXTRACTION, BILATERAL     CHOLECYSTECTOMY  1984   COLONOSCOPY  04/29/2010   [name]   CYSTOSCOPY  11/11/2011   Procedure: CYSTOSCOPY;  Surgeon: Olga Millers, MD;  Location: [facility];  Service: Gynecology;  Laterality: N/A;   EXTUBATION (ENDOTRACHEAL) IN OR N/A 09/23/2017   Procedure: EXTUBATION (ENDOTRACHEAL) IN OR;  Surgeon: Helayne Seminole, MD;  Location: [place];  Service: ENT;  Laterality: N/A;   FIBEROPTIC LARYNGOSCOPY AND TRACHEOSCOPY N/A 09/23/2017   Procedure: FLEXIBLE FIBEROPTIC LARYNGOSCOPY;  Surgeon: Helayne Seminole, MD;  Location: [place];  Service: ENT;  Laterality: N/A;   FRACTURE SURGERY     foot   [place] Left 10/02/2018   Procedure: HALLUX ARTHRODESIS;  Surgeon: Edrick Kins, DPM;  Location: [facility];  Service: Podiatry;  Laterality: Left;   HAMMER TOE SURGERY Left 10/02/2018  Procedure: HAMMER TOE CORRECTION 2ND 3RD 4RD FIFTH TOE;  Surgeon: Edrick Kins, DPM;  Location: [facility];  Service: Podiatry;  Laterality: Left;   HAMMER TOE SURGERY Right 04/12/2019   Procedure: HAMMER TOE CORRECTION, 2ND, 3RD, 4TH AND 5TH TOES OF RIGHT FOOT;  Surgeon: Edrick Kins, DPM;  Location: [place];  Service: Podiatry;  Laterality: Right;   HAMMER TOE SURGERY Left 10/25/2019   Procedure: HAMMER TOE REPAIR SECOND, THIRD, FOUTH;  Surgeon: Edrick Kins, DPM;  Location: [facility];  Service: Podiatry;  Laterality: Left;   HERNIA REPAIR     I & D EXTREMITY Left 06/27/2015   Procedure: Partial Excision Left Calcaneus,  Place Antibiotic Beads, and Wound VAC;  Surgeon: Newt Minion, MD;  Location: [place];  Service: Orthopedics;  Laterality: Left;   JOINT REPLACEMENT     KNEE ARTHROSCOPY     right   LEFT AND RIGHT HEART CATHETERIZATION WITH CORONARY ANGIOGRAM N/A 04/23/2013   Procedure: LEFT AND RIGHT HEART CATHETERIZATION WITH CORONARY ANGIOGRAM;  Surgeon: Leonie Man, MD;  Location: [facility] C[facility];  Service: Cardiovascular;  Laterality: N/A;   LEFT HEART CATH AND CORONARY ANGIOGRAPHY N/A 03/11/2017   Procedure: Left Heart Cath and Coronary Angiography;  Surgeon: Sherren Mocha, MD;  Location: [place] CV LAB; angiographically minimal CAD in the LAD otherwise normal.;  Normal LVEDP.  FALSE POSITIVE MYOVIEW   LEFT HEART CATH AND CORONARY ANGIOGRAPHY  07/20/2010   LVEF 50-55% WITH VERY MILD GLOBAL HYPOKINESIA; ESSENTIALLY NORMAL CORONARY ARTERIES; NORMAL LV FUNCTION   METATARSAL OSTEOTOMY WITH OPEN REDUCTION INTERNAL FIXATION (ORIF) METATARSAL WITH FUSION Left 04/09/2014   Procedure: LEFT FOOT FRACTURE OPEN TREATMENT METATARSAL INCLUDES INTERNAL FIXATION EACH;  Surgeon: Lorn Junes, MD;  Location: [place];  Service: Orthopedics;  Laterality: Left;   NISSEN FUNDOPLICATION  7124   NM MYOVIEW LTD --> FALSE POSITIVE  03/10/2017   : Moderate size "stress-induced "perfusion defect at the apex as well as "ill-defined stress-induced perfusion defect in the lateral wall.  EF 55%.  INTERMEDIATE risk. -->  FALSE POSITIVE   ORIF DISTAL FEMUR FRACTURE  08/08/2020   [facility]: ORIF of left extra-articular periprosthetic distal femur fracture with synthesis of the femur plate with cortical nonlocking screws.  (Thought to be pathologic fracture due to osteoporosis) -> after a fall   ORIF FEMUR W/ PERI-IMPLANT  08/29/2020   [facility]) revision of left femur fracture ORIF plate fixation screws; culture positive for MSSA   Right and left CARDIAC CATHETERIZATION  04/23/2013    Angiographic normal coronaries; LVEDP 20 mmHg, PCWP 12-14 mmHg, RAP 12 mmHg.; Fick CO/CI 4.9/2.2   SHOULDER ARTHROSCOPY Left 03/14/2019   Procedure: LEFT SHOULDER ARTHROSCOPY, DEBRIDEMENT, AND DECOMPRESSION;  Surgeon: Newt Minion, MD;  Location: [place];  Service: Orthopedics;  Laterality: Left;   TOTAL KNEE ARTHROPLASTY Right 06/29/2017   Procedure: RIGHT TOTAL KNEE ARTHROPLASTY;  Surgeon: Newt Minion, MD;  Location: [place];  Service: Orthopedics;  Laterality: Right;   TOTAL KNEE ARTHROPLASTY Left 12/07/2017   TOTAL KNEE ARTHROPLASTY Left 12/07/2017   Procedure: LEFT TOTAL KNEE ARTHROPLASTY;  Surgeon: Newt Minion, MD;  Location: [place];  Service: Orthopedics;  Laterality: Left;   TRACHEOSTOMY TUBE PLACEMENT N/A 09/20/2017   Procedure: AWAKE INTUBATION WITH ANESTHESIA WITH VIDEO ASSISTANCE;  Surgeon: Helayne Seminole, MD;  Location: [facility] [facility];  Service: ENT;  Laterality: N/A;   TRANSTHORACIC ECHOCARDIOGRAM  08/2014   Normal LV size  and function.  Mild LVH.  EF 55-60%.  Normal regional wall motion.  GR 1 DD.  Normal RV size and function .   TRANSTHORACIC ECHOCARDIOGRAM  08/12/2020    [facility]): Normal LV size and function.  EF 55 to 60%.  No RWM A.  Normal filling pattern.  Normal RV.  No significant valvular disease-stenosis or regurgitation.  No vegetation.   TUBAL LIGATION     with reversal in 1994   UPPER GASTROINTESTINAL ENDOSCOPY     VENTRAL HERNIA REPAIR      Social History   Socioeconomic History   Marital status: Married    Spouse name: GERILYNN MCCULLARS   Number of children: 2   Years of education: 12   Highest education level: Not on file  Occupational History   Occupation: DISABLED  Tobacco Use   Smoking status: Never   Smokeless tobacco: Never  Vaping Use   Vaping Use: Never used  Substance and Sexual Activity   Alcohol use: No    Alcohol/week: 0.0 standard drinks   Drug use: No   Sexual activity: Yes    Birth control/protection: Surgical  Other  Topics Concern   Not on file  Social History Narrative   Married 1994. 2 sons who both live close and 1 grandson.       Disability due to sarcoidosis. 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 [employer]   Social Determinants of Health   Financial Resource Strain: Low Risk    Difficulty of Paying Living Expenses: Not hard at all  Food Insecurity: No Food Insecurity   Worried About Charity fundraiser in the Last Year: Never true   Arboriculturist in the Last Year: Never true  Transportation Needs: No Transportation Needs   Lack of Transportation (Medical): No   Lack of Transportation (Non-Medical): No  Physical Activity: Insufficiently Active   Days of Exercise per Week: 3 days   Minutes of Exercise per Session: 30 min  Stress: No Stress Concern Present   Feeling of Stress : Not at all  Social Connections: Moderately Integrated   Frequency of Communication with Friends and Family: More than three times a week   Frequency of Social Gatherings with Friends and Family: More than three times a week   Attends Religious Services: More than 4 times per year   Active Member of [employer] or Organizations: No   Attends Archivist Meetings: Never   Marital Status: Married  Human resources officer Violence: Not At Risk   Fear of Current or Ex-Partner: No   Emotionally Abused: No   Physically Abused: No   Sexually Abused: No    Current Outpatient Medications on File Prior to Visit  Medication Sig Dispense Refill   albuterol (PROVENTIL HFA;VENTOLIN HFA) 108 (90 Base) MCG/ACT inhaler Inhale 1-2 puffs into the lungs every 6 (six) hours as needed for wheezing or shortness of breath. 8 g 5   allopurinol (ZYLOPRIM) 300 MG tablet TAKE 1 TABLET BY MOUTH EVERY DAY 90 tablet 0   aspirin EC 81 MG tablet Take 1 tablet (81 mg total) by mouth daily. 30 tablet 11    atorvastatin (LIPITOR) 40 MG tablet TAKE 1 TABLET BY MOUTH EVERY DAY 90 tablet 3   benzonatate (TESSALON PERLES) 100 MG capsule Take 1 capsule (100 mg total) by mouth 3 (  three) times daily as needed for cough. 20 capsule 0   BREO ELLIPTA 200-25 MCG/INH AEPB TAKE 1 PUFF BY MOUTH EVERY DAY 60 each 5   buPROPion (WELLBUTRIN XL) 300 MG 24 hr tablet TAKE 1 TABLET BY MOUTH EVERY DAY 90 tablet 3   carvedilol (COREG) 12.5 MG tablet Take 12.5 mg by mouth 2 (two) times daily with a meal.     Cholecalciferol 25 MCG (1000 UT) capsule Take 1,000 Units by mouth in the morning and at bedtime.     diclofenac Sodium (VOLTAREN) 1 % GEL Apply 1 application topically as needed (pain.).      diltiazem (CARDIZEM CD) 120 MG 24 hr capsule Take 1 capsule (120 mg total) by mouth daily. 90 capsule 3   famotidine (PEPCID) 20 MG tablet Take 1 tablet (20 mg total) by mouth at bedtime. 30 tablet 3   fenofibrate 160 MG tablet TAKE 1 TABLET BY MOUTH EVERY DAY 90 tablet 3   fluticasone (FLONASE) 50 MCG/ACT nasal spray Place 2 sprays into both nostrils daily as needed for allergies. 48 mL 3   furosemide (LASIX) 40 MG tablet TAKE 1 TABLET BY MOUTH TWICE A DAY 180 tablet 1   gabapentin (NEURONTIN) 100 MG capsule TAKE 1 CAPSULE BY MOUTH THREE TIMES A DAY 270 capsule 3   glucose blood (CONTOUR NEXT TEST) test strip 1 each by Other route 4 (four) times daily. And lancets 4/day 400 each 3   HYDROcodone-acetaminophen (NORCO) 5-325 MG tablet Take 1 tablet by mouth every 4 (four) hours as needed for moderate pain. 6 tablet 0   insulin aspart (NOVOLOG FLEXPEN) 100 UNIT/ML FlexPen Inject 15 Units into the skin daily with supper. 15 mL 11   insulin glargine (LANTUS SOLOSTAR) 100 UNIT/ML Solostar Pen Inject 16 Units into the skin every morning. 30 mL 2   Insulin Pen Needle (PEN NEEDLES) 32G X 4 MM MISC 1 each by Does not apply route 4 (four) times daily. E11.9 400 each 0   ipratropium (ATROVENT) 0.03 % nasal spray PLACE 2 SPRAYS INTO BOTH  NOSTRILS EVERY 12 (TWELVE) HOURS. 30 mL 2   KLOR-CON M20 20 MEQ tablet TAKE 1 & 1/2 TABLETS BY MOUTH TWICE A DAY 270 tablet 2   lactulose (CHRONULAC) 10 GM/15ML solution TAKE 15 MLS BY MOUTH DAILY AS NEEDED FOR CONSTIPATION. 1419 mL 1   Lancet Devices (EASY MINI EJECT LANCING DEVICE) MISC Use as directed 4 times daily to test blood sugar 1 each 3   LINZESS 290 MCG CAPS capsule TAKE 1 CAPSULE BY MOUTH DAILY BEFORE BREAKFAST. 30 capsule 3   LORazepam (ATIVAN) 0.5 MG tablet Take 0.5 mg by mouth every 12 (twelve) hours as needed for anxiety.     meloxicam (MOBIC) 15 MG tablet Take 1 tablet (15 mg total) by mouth daily. 30 tablet 1   metFORMIN (GLUCOPHAGE-XR) 500 MG 24 hr tablet Take 4 tablets (2,000 mg total) by mouth daily with breakfast. 360 tablet 3   methocarbamol (ROBAXIN) 500 MG tablet Take 1 tablet (500 mg total) by mouth every 8 (eight) hours as needed for muscle spasms. 40 tablet 0   metoCLOPramide (REGLAN) 10 MG tablet Take 1 tablet (10 mg total) by mouth every 6 (six) hours. 30 tablet 0   Microlet Lancets MISC 1 each by Does not apply route 2 (two) times daily. E11.9 100 each 2   nitroGLYCERIN (NITROSTAT) 0.4 MG SL tablet Place 1 tablet (0.4 mg total) under the tongue every 5 (five) minutes as needed  for chest pain. Reported on 01/05/2016 25 tablet 6   nystatin (MYCOSTATIN) 100000 UNIT/ML suspension nystatin 100,000 unit/mL oral suspension  SWISH, GARGLE, AND SPIT 20 ML 4 TIMES A DAY UNTIL RESOLVED     ondansetron (ZOFRAN-ODT) 4 MG disintegrating tablet Take 1 tablet (4 mg total) by mouth every 8 (eight) hours as needed for nausea or vomiting. 20 tablet 0   RABEprazole (ACIPHEX) 20 MG tablet Take 1 tablet (20 mg total) by mouth daily. 90 tablet 3   Semaglutide, 2 MG/DOSE, (OZEMPIC, 2 MG/DOSE,) 8 MG/3ML SOPN Inject 2 mg into the skin once a week. 9 mL 3   telmisartan (MICARDIS) 20 MG tablet TAKE 1 TABLET BY MOUTH EVERY DAY 90 tablet 1   venlafaxine XR (EFFEXOR-XR) 75 MG 24 hr capsule TAKE 1  CAPSULE BY MOUTH TWICE A DAY 180 capsule 3   [DISCONTINUED] mupirocin nasal ointment (BACTROBAN) 2 % Place 1 application into the nose 2 (two) times daily. Use one-half of tube in each nostril twice daily for five (5) days. After application, press sides of nose together and gently massage. 10 g 0   No current facility-administered medications on file prior to visit.    Allergies  Allergen Reactions   Methotrexate Other (See Comments)    Peri-oral and buccal lesions   Vancomycin Other (See Comments)    DOSE RELATED NEPHROTOXICITY   Lisinopril Cough   Chlorhexidine Itching   Clindamycin/Lincomycin Nausea And Vomiting and Rash   Doxycycline Rash   Teflaro [Ceftaroline] Rash and Other (See Comments)    Tolerates ceftriaxone     Family History  Problem Relation Age of Onset   Diabetes Father    Heart attack Father 26   Coronary artery disease Father    Heart failure Father    Throat cancer Father    Hypertension Father    Heart disease Father    Sleep apnea Father    Obesity Father    COPD Mother    Emphysema Mother    Asthma Mother    Heart failure Mother    Breast cancer Mother    Diabetes Mother    Kidney disease Mother    Thyroid disease Mother    Heart attack Maternal Grandfather    Sarcoidosis Maternal Uncle    Lung cancer Brother    Diabetes Brother    Colon cancer Neg Hx    Colon polyps Neg Hx    Esophageal cancer Neg Hx    Rectal cancer Neg Hx    Stomach cancer Neg Hx     BP (!) 158/80 (BP Location: Left Arm, Patient Position: Sitting, Cuff Size: Normal)    Pulse 90    Ht 5\' 6"  (1.676 m)    Wt 226 lb (102.5 kg)    SpO2 95%    BMI 36.48 kg/m    Review of Systems She denies hypoglycemia.      Objective:   Physical Exam    Lab Results  Component Value Date   HGBA1C 8.7 (A) 12/03/2021      Assessment & Plan:  Insulin-requiring type 2 DM: uncontrolled N/V, due to full dose of Ozempic.  We discussed.  We agree to resume at a lower dosage.    Patient Instructions  I have sent a prescription to your pharmacy, for a lower amount of Ozempic.  Start with 0.25 mg per week.  Then increase as you can, back up to 2 mg.   Please continue the same other medications.  check your blood  sugar 4 times a day: before the 3 meals, and at bedtime.  also check if you have symptoms of your blood sugar being too high or too low.  please keep a record of the readings and bring it to your next appointment here (or you can bring the meter itself).  You can write it on any piece of paper.  please call us sooner if your blood sugar goes below 70, or if you have a lot of readings over 200.   Please come back for a follow-up appointment in 2 months.

## 2021-12-04 ENCOUNTER — Ambulatory Visit (INDEPENDENT_AMBULATORY_CARE_PROVIDER_SITE_OTHER): Payer: BC Managed Care – PPO | Admitting: Physician Assistant

## 2021-12-04 ENCOUNTER — Encounter: Payer: Self-pay | Admitting: Physician Assistant

## 2021-12-04 VITALS — BP 149/68 | HR 87 | Temp 98.1°F | Ht 66.0 in | Wt 225.8 lb

## 2021-12-04 DIAGNOSIS — R011 Cardiac murmur, unspecified: Secondary | ICD-10-CM | POA: Diagnosis not present

## 2021-12-04 DIAGNOSIS — J0110 Acute frontal sinusitis, unspecified: Secondary | ICD-10-CM | POA: Diagnosis not present

## 2021-12-04 MED ORDER — AMOXICILLIN-POT CLAVULANATE 875-125 MG PO TABS: 1.0000 | ORAL_TABLET | Freq: Two times a day (BID) | ORAL | 0 refills | Status: DC

## 2021-12-04 NOTE — Progress Notes (Signed)
HUDSYN BARICH is a 61 y.o. female here for right ear pain.  History of Present Illness:   Chief Complaint  Patient presents with   Ear Pain    Right ear, Present for about a week. Aching/sore inside.   Facial Pain    Pressure around the nose area.    HPI  Right Ear Pain Rosealee presents with c/o right ear pain that has been onset of a week. Pt describes pain to be an aching/sore sensation. In addition to ear pain, she is also experiencing facial pressure, particularly around her nose region. She has had nasal congestion with occasional green nasal drainage as well as a dry cough. At this time she expresses belief that this could be a sinus infection. Denies fever or chills. She has not done a covid test.  Heart murmur Patient reports that she has never been told she has a heart murmur.  On my exam, I feel as though there was 1.  She actually has an appointment to see her cardiologist, Dr. Ellyn Hack, next week.  Denies any shortness of breath, chest pain, lower extremity swelling.  Per chart review last echo was done on November 2015: Impressions:  - Normal LV size with mild LV hypertrophy. EF 55-60%. Normal RV    size and systolic function. No significant valvular    abnormalities.   Past Medical History:  Diagnosis Date   Abnormal SPEP 04/17/2014   Acute left ankle pain 01/26/2017   ANEMIA-UNSPECIFIED 09/18/2009   Anxiety    Arthritis    Blood transfusion without reported diagnosis    Cataract    CHF (congestive heart failure) (HCC)    Chronic diastolic heart failure, NYHA class 2 (HCC)    Normal LVEDP by May 2018   COPD (chronic obstructive pulmonary disease) ([place])    Depression    DIABETES MELLITUS, TYPE II 08/21/2006   Diabetic osteomyelitis ([place]) 05/29/2015   Fatty liver    Fracture of 5th metatarsal    non union   GERD 94/17/4081   Had fundoplication   GOUT 44/81/8563   Hammer toe of right foot    2-5th toes   Hx of umbilical hernia repair    HYPERLIPIDEMIA  08/21/2006   HYPERTENSION 08/21/2006   Infection of wound due to methicillin resistant Staphylococcus aureus (MRSA)    Internal hemorrhoids    Kidney problem    Multiple allergies 10/14/2016   OBESITY 06/04/2009   Onychomycosis 10/27/2015   Osteomyelitis of left foot ([place]) 05/29/2015   Osteoporosis    Pulmonary sarcoidosis ([place])    Followed locally by pulmonology, but also by Dr. Casper Harrison at [facility]   Right knee pain 01/26/2017   Vocal cord dysfunction    Wears partial dentures      Social History   Tobacco Use   Smoking status: Never   Smokeless tobacco: Never  Vaping Use   Vaping Use: Never used  Substance Use Topics   Alcohol use: No    Alcohol/week: 0.0 standard drinks   Drug use: No    Past Surgical History:  Procedure Laterality Date   ABDOMINAL HYSTERECTOMY     APPENDECTOMY     BLADDER SUSPENSION  11/11/2011   Procedure: TRANSVAGINAL TAPE (TVT) PROCEDURE;  Surgeon: Olga Millers, MD;  Location: [place] [facility];  Service: Gynecology;  Laterality: N/A;   CAROTIDS  02/18/11   CAROTID DUPLEX; VERTEBRALS ARE PATENT WITH ANTEGRADE FLOW. ICA/CCA RATIO 1.61 ON RIGHT AND 0.75 ON LEFT   CATARACT EXTRACTION, BILATERAL  CHOLECYSTECTOMY  1984   COLONOSCOPY  04/29/2010   Lennice Sites  11/11/2011   Procedure: CYSTOSCOPY;  Surgeon: Olga Millers, MD;  Location: [place] [facility];  Service: Gynecology;  Laterality: N/A;   EXTUBATION (ENDOTRACHEAL) IN OR N/A 09/23/2017   Procedure: EXTUBATION (ENDOTRACHEAL) IN OR;  Surgeon: Helayne Seminole, MD;  Location: [place];  Service: ENT;  Laterality: N/A;   FIBEROPTIC LARYNGOSCOPY AND TRACHEOSCOPY N/A 09/23/2017   Procedure: FLEXIBLE FIBEROPTIC LARYNGOSCOPY;  Surgeon: Helayne Seminole, MD;  Location: [place];  Service: ENT;  Laterality: N/A;   FRACTURE SURGERY     foot   [place] Left 10/02/2018   Procedure: HALLUX ARTHRODESIS;  Surgeon: Edrick Kins, DPM;  Location: [facility];  Service: Podiatry;  Laterality: Left;   HAMMER  TOE SURGERY Left 10/02/2018   Procedure: HAMMER TOE CORRECTION 2ND 3RD 4RD FIFTH TOE;  Surgeon: Edrick Kins, DPM;  Location: [facility];  Service: Podiatry;  Laterality: Left;   HAMMER TOE SURGERY Right 04/12/2019   Procedure: HAMMER TOE CORRECTION, 2ND, 3RD, 4TH AND 5TH TOES OF RIGHT FOOT;  Surgeon: Edrick Kins, DPM;  Location: [place];  Service: Podiatry;  Laterality: Right;   HAMMER TOE SURGERY Left 10/25/2019   Procedure: HAMMER TOE REPAIR SECOND, THIRD, FOUTH;  Surgeon: Edrick Kins, DPM;  Location: [place] [facility];  Service: Podiatry;  Laterality: Left;   HERNIA REPAIR     I & D EXTREMITY Left 06/27/2015   Procedure: Partial Excision Left Calcaneus, Place Antibiotic Beads, and Wound VAC;  Surgeon: Newt Minion, MD;  Location: [place];  Service: Orthopedics;  Laterality: Left;   JOINT REPLACEMENT     KNEE ARTHROSCOPY     right   LEFT AND RIGHT HEART CATHETERIZATION WITH CORONARY ANGIOGRAM N/A 04/23/2013   Procedure: LEFT AND RIGHT HEART CATHETERIZATION WITH CORONARY ANGIOGRAM;  Surgeon: Leonie Man, MD;  Location: [facility] C[facility];  Service: Cardiovascular;  Laterality: N/A;   LEFT HEART CATH AND CORONARY ANGIOGRAPHY N/A 03/11/2017   Procedure: Left Heart Cath and Coronary Angiography;  Surgeon: Sherren Mocha, MD;  Location: [place] [facility]; angiographically minimal CAD in the LAD otherwise normal.;  Normal LVEDP.  FALSE POSITIVE MYOVIEW   LEFT HEART CATH AND CORONARY ANGIOGRAPHY  07/20/2010   LVEF 50-55% WITH VERY MILD GLOBAL HYPOKINESIA; ESSENTIALLY NORMAL CORONARY ARTERIES; NORMAL LV FUNCTION   METATARSAL OSTEOTOMY WITH OPEN REDUCTION INTERNAL FIXATION (ORIF) METATARSAL WITH FUSION Left 04/09/2014   Procedure: LEFT FOOT FRACTURE OPEN TREATMENT METATARSAL INCLUDES INTERNAL FIXATION EACH;  Surgeon: Lorn Junes, MD;  Location: [place];  Service: Orthopedics;  Laterality: Left;   NISSEN FUNDOPLICATION  1610   NM MYOVIEW LTD --> FALSE POSITIVE  03/10/2017   : Moderate size  "stress-induced "perfusion defect at the apex as well as "ill-defined stress-induced perfusion defect in the lateral wall.  EF 55%.  INTERMEDIATE risk. -->  FALSE POSITIVE   ORIF DISTAL FEMUR FRACTURE  08/08/2020   [facility]: ORIF of left extra-articular periprosthetic distal femur fracture with synthesis of the femur plate with cortical nonlocking screws.  (Thought to be pathologic fracture due to osteoporosis) -> after a fall   ORIF FEMUR W/ PERI-IMPLANT  08/29/2020   [facility]) revision of left femur fracture ORIF plate fixation screws; culture positive for MSSA   Right and left CARDIAC CATHETERIZATION  04/23/2013   Angiographic normal coronaries; LVEDP 20 mmHg, PCWP 12-14 mmHg, RAP 12 mmHg.; Fick CO/CI 4.9/2.2  SHOULDER ARTHROSCOPY Left 03/14/2019   Procedure: LEFT SHOULDER ARTHROSCOPY, DEBRIDEMENT, AND DECOMPRESSION;  Surgeon: Newt Minion, MD;  Location: [place];  Service: Orthopedics;  Laterality: Left;   TOTAL KNEE ARTHROPLASTY Right 06/29/2017   Procedure: RIGHT TOTAL KNEE ARTHROPLASTY;  Surgeon: Newt Minion, MD;  Location: [place];  Service: Orthopedics;  Laterality: Right;   TOTAL KNEE ARTHROPLASTY Left 12/07/2017   TOTAL KNEE ARTHROPLASTY Left 12/07/2017   Procedure: LEFT TOTAL KNEE ARTHROPLASTY;  Surgeon: Newt Minion, MD;  Location: [place];  Service: Orthopedics;  Laterality: Left;   TRACHEOSTOMY TUBE PLACEMENT N/A 09/20/2017   Procedure: AWAKE INTUBATION WITH ANESTHESIA WITH VIDEO ASSISTANCE;  Surgeon: Helayne Seminole, MD;  Location: [place] [facility];  Service: ENT;  Laterality: N/A;   TRANSTHORACIC ECHOCARDIOGRAM  08/2014   Normal LV size and function.  Mild LVH.  EF 55-60%.  Normal regional wall motion.  GR 1 DD.  Normal RV size and function .   TRANSTHORACIC ECHOCARDIOGRAM  08/12/2020    [facility]): Normal LV size and function.  EF 55 to 60%.  No RWM A.  Normal filling pattern.  Normal RV.  No significant valvular disease-stenosis or  regurgitation.  No vegetation.   TUBAL LIGATION     with reversal in [place]      Family History  Problem Relation Age of Onset   Diabetes Father    Heart attack Father 13   Coronary artery disease Father    Heart failure Father    Throat cancer Father    Hypertension Father    Heart disease Father    Sleep apnea Father    Obesity Father    COPD Mother    Emphysema Mother    Asthma Mother    Heart failure Mother    Breast cancer Mother    Diabetes Mother    Kidney disease Mother    Thyroid disease Mother    Heart attack Maternal Grandfather    Sarcoidosis Maternal Uncle    Lung cancer Brother    Diabetes Brother    Colon cancer Neg Hx    Colon polyps Neg Hx    Esophageal cancer Neg Hx    Rectal cancer Neg Hx    Stomach cancer Neg Hx     Allergies  Allergen Reactions   Methotrexate Other (See Comments)    Peri-oral and buccal lesions   Vancomycin Other (See Comments)    DOSE RELATED NEPHROTOXICITY   Lisinopril Cough   Chlorhexidine Itching   Clindamycin/Lincomycin Nausea And Vomiting and Rash   Doxycycline Rash   Teflaro [Ceftaroline] Rash and Other (See Comments)    Tolerates ceftriaxone     Current Medications:   Current Outpatient Medications:    albuterol (PROVENTIL HFA;VENTOLIN HFA) 108 (90 Base) MCG/ACT inhaler, Inhale 1-2 puffs into the lungs every 6 (six) hours as needed for wheezing or shortness of breath., Disp: 8 g, Rfl: 5   allopurinol (ZYLOPRIM) 300 MG tablet, TAKE 1 TABLET BY MOUTH EVERY DAY, Disp: 90 tablet, Rfl: 0   aspirin EC 81 MG tablet, Take 1 tablet (81 mg total) by mouth daily., Disp: 30 tablet, Rfl: 11   atorvastatin (LIPITOR) 40 MG tablet, TAKE 1 TABLET BY MOUTH EVERY DAY, Disp: 90 tablet, Rfl: 3   benzonatate (TESSALON PERLES) 100 MG capsule, Take 1 capsule (100 mg total) by mouth 3 (three) times daily as needed for cough., Disp: 20 capsule, Rfl:  0   BREO ELLIPTA 200-25 MCG/INH  AEPB, TAKE 1 PUFF BY MOUTH EVERY DAY, Disp: 60 each, Rfl: 5   buPROPion (WELLBUTRIN XL) 300 MG 24 hr tablet, TAKE 1 TABLET BY MOUTH EVERY DAY, Disp: 90 tablet, Rfl: 3   carvedilol (COREG) 12.5 MG tablet, Take 12.5 mg by mouth 2 (two) times daily with a meal., Disp: , Rfl:    Cholecalciferol 25 MCG (1000 UT) capsule, Take 1,000 Units by mouth in the morning and at bedtime., Disp: , Rfl:    diclofenac Sodium (VOLTAREN) 1 % GEL, Apply 1 application topically as needed (pain.). , Disp: , Rfl:    diltiazem (CARDIZEM CD) 120 MG 24 hr capsule, Take 1 capsule (120 mg total) by mouth daily., Disp: 90 capsule, Rfl: 3   famotidine (PEPCID) 20 MG tablet, Take 1 tablet (20 mg total) by mouth at bedtime., Disp: 30 tablet, Rfl: 3   fenofibrate 160 MG tablet, TAKE 1 TABLET BY MOUTH EVERY DAY, Disp: 90 tablet, Rfl: 3   fluticasone (FLONASE) 50 MCG/ACT nasal spray, Place 2 sprays into both nostrils daily as needed for allergies., Disp: 48 mL, Rfl: 3   furosemide (LASIX) 40 MG tablet, TAKE 1 TABLET BY MOUTH TWICE A DAY, Disp: 180 tablet, Rfl: 1   gabapentin (NEURONTIN) 100 MG capsule, TAKE 1 CAPSULE BY MOUTH THREE TIMES A DAY, Disp: 270 capsule, Rfl: 3   glucose blood (CONTOUR NEXT TEST) test strip, 1 each by Other route 4 (four) times daily. And lancets 4/day, Disp: 400 each, Rfl: 3   HYDROcodone-acetaminophen (NORCO) 5-325 MG tablet, Take 1 tablet by mouth every 4 (four) hours as needed for moderate pain., Disp: 6 tablet, Rfl: 0   insulin aspart (NOVOLOG FLEXPEN) 100 UNIT/ML FlexPen, Inject 15 Units into the skin daily with supper., Disp: 15 mL, Rfl: 11   insulin glargine (LANTUS SOLOSTAR) 100 UNIT/ML Solostar Pen, Inject 16 Units into the skin every morning., Disp: 30 mL, Rfl: 2   Insulin Pen Needle (PEN NEEDLES) 32G X 4 MM MISC, 1 each by Does not apply route 4 (four) times daily. E11.9, Disp: 400 each, Rfl: 0   ipratropium (ATROVENT) 0.03 % nasal spray, PLACE 2 SPRAYS INTO BOTH NOSTRILS EVERY 12 (TWELVE) HOURS.,  Disp: 30 mL, Rfl: 2   KLOR-CON M20 20 MEQ tablet, TAKE 1 & 1/2 TABLETS BY MOUTH TWICE A DAY, Disp: 270 tablet, Rfl: 2   lactulose (CHRONULAC) 10 GM/15ML solution, TAKE 15 MLS BY MOUTH DAILY AS NEEDED FOR CONSTIPATION., Disp: 1419 mL, Rfl: 1   Lancet Devices (EASY MINI EJECT LANCING DEVICE) MISC, Use as directed 4 times daily to test blood sugar, Disp: 1 each, Rfl: 3   LINZESS 290 MCG CAPS capsule, TAKE 1 CAPSULE BY MOUTH DAILY BEFORE BREAKFAST., Disp: 30 capsule, Rfl: 3   LORazepam (ATIVAN) 0.5 MG tablet, Take 0.5 mg by mouth every 12 (twelve) hours as needed for anxiety., Disp: , Rfl:    meloxicam (MOBIC) 15 MG tablet, Take 1 tablet (15 mg total) by mouth daily., Disp: 30 tablet, Rfl: 1   metFORMIN (GLUCOPHAGE-XR) 500 MG 24 hr tablet, Take 4 tablets (2,000 mg total) by mouth daily with breakfast., Disp: 360 tablet, Rfl: 3   methocarbamol (ROBAXIN) 500 MG tablet, Take 1 tablet (500 mg total) by mouth every 8 (eight) hours as needed for muscle spasms., Disp: 40 tablet, Rfl: 0   metoCLOPramide (REGLAN) 10 MG tablet, Take 1 tablet (10 mg total) by mouth every 6 (six) hours., Disp: 30 tablet, Rfl:  0   Microlet Lancets MISC, 1 each by Does not apply route 2 (two) times daily. E11.9, Disp: 100 each, Rfl: 2   nitroGLYCERIN (NITROSTAT) 0.4 MG SL tablet, Place 1 tablet (0.4 mg total) under the tongue every 5 (five) minutes as needed for chest pain. Reported on 01/05/2016, Disp: 25 tablet, Rfl: 6   nystatin (MYCOSTATIN) 100000 UNIT/ML suspension, nystatin 100,000 unit/mL oral suspension  SWISH, GARGLE, AND SPIT 20 ML 4 TIMES A DAY UNTIL RESOLVED, Disp: , Rfl:    ondansetron (ZOFRAN-ODT) 4 MG disintegrating tablet, Take 1 tablet (4 mg total) by mouth every 8 (eight) hours as needed for nausea or vomiting., Disp: 20 tablet, Rfl: 0   RABEprazole (ACIPHEX) 20 MG tablet, Take 1 tablet (20 mg total) by mouth daily., Disp: 90 tablet, Rfl: 3   Semaglutide, 2 MG/DOSE, (OZEMPIC, 2 MG/DOSE,) 8 MG/3ML SOPN, Inject 2 mg into  the skin once a week., Disp: 9 mL, Rfl: 3   Semaglutide,0.25 or 0.5MG /DOS, (OZEMPIC, 0.25 OR 0.5 MG/DOSE,) 2 MG/1.5ML SOPN, Inject 0.5 mg into the skin once a week., Disp: 4.5 mL, Rfl: 3   telmisartan (MICARDIS) 20 MG tablet, TAKE 1 TABLET BY MOUTH EVERY DAY, Disp: 90 tablet, Rfl: 1   venlafaxine XR (EFFEXOR-XR) 75 MG 24 hr capsule, TAKE 1 CAPSULE BY MOUTH TWICE A DAY, Disp: 180 capsule, Rfl: 3   amoxicillin-clavulanate (AUGMENTIN) 875-125 MG tablet, Take 1 tablet by mouth 2 (two) times daily. (Patient not taking: Reported on 12/04/2021), Disp: 20 tablet, Rfl: 0   Review of Systems:   ROS Negative unless otherwise specified per HPI.  Vitals:   Vitals:   12/04/21 0910  BP: (!) 149/68  Pulse: 87  Temp: 98.1 F (36.7 C)  TempSrc: Temporal  SpO2: 98%  Weight: 225 lb 12.8 oz (102.4 kg)  Height: 5\' 6"  (1.676 m)     Body mass index is 36.45 kg/m.  Physical Exam:   Physical Exam Vitals and nursing note reviewed.  Constitutional:      General: She is not in acute distress.    Appearance: She is well-developed. She is not ill-appearing or toxic-appearing.  HENT:     Right Ear: Tenderness present. A middle ear effusion is present. Tympanic membrane is erythematous.     Left Ear: A middle ear effusion is present.  Cardiovascular:     Rate and Rhythm: Normal rate and regular rhythm.     Pulses: Normal pulses.     Heart sounds: S1 normal and S2 normal. Murmur heard.  Pulmonary:     Effort: Pulmonary effort is normal.     Breath sounds: Normal breath sounds.  Skin:    General: Skin is warm and dry.  Neurological:     Mental Status: She is alert.     GCS: GCS eye subscore is 4. GCS verbal subscore is 5. GCS motor subscore is 6.  Psychiatric:        Speech: Speech normal.        Behavior: Behavior normal. Behavior is cooperative.    Assessment and Plan:   Acute non-recurrent frontal sinusitis No red flags on exam.  Will initiate augmentin x 5 days per orders for R AOM and  sinusitis. Discussed taking medications as prescribed. Reviewed return precautions including worsening fever, SOB, worsening cough or other concerns. Push fluids and rest. I recommend that patient follow-up if symptoms worsen or persist despite treatment x 7-10 days, sooner if needed.  Heart murmur Heard on my exam today New for patient  per chart review today Asymptomatic Discussed that patient needs to check in with her cardiologist about this when she sees him Monday --she verbalizes understanding to plan  I,Havlyn C Ratchford,acting as a scribe for Inda Coke, PA.,have documented all relevant documentation on the behalf of Inda Coke, PA,as directed by  Inda Coke, PA while in the presence of Inda Coke, [place].  I, Inda Coke, [place], have reviewed all documentation for this visit. The documentation on 12/04/21 for the exam, diagnosis, procedures, and orders are all accurate and complete.   Inda Coke, PA-C

## 2021-12-04 NOTE — Patient Instructions (Signed)
It was great to see you!  Start oral augmentin antibiotic Go ahead and let your surgeon know that we are doing this antibiotic  Please let your cardiologist know that I believe I heard a murmur on your exam today  Best of luck with your surgery.  Take care,  Inda Coke PA-C

## 2021-12-07 ENCOUNTER — Ambulatory Visit (INDEPENDENT_AMBULATORY_CARE_PROVIDER_SITE_OTHER): Payer: BC Managed Care – PPO | Admitting: Cardiology

## 2021-12-07 ENCOUNTER — Other Ambulatory Visit: Payer: Self-pay

## 2021-12-07 ENCOUNTER — Encounter: Payer: Self-pay | Admitting: Cardiology

## 2021-12-07 VITALS — BP 138/66 | HR 86 | Ht 66.0 in | Wt 220.6 lb

## 2021-12-07 DIAGNOSIS — I110 Hypertensive heart disease with heart failure: Secondary | ICD-10-CM

## 2021-12-07 DIAGNOSIS — R002 Palpitations: Secondary | ICD-10-CM | POA: Diagnosis not present

## 2021-12-07 DIAGNOSIS — R55 Syncope and collapse: Secondary | ICD-10-CM

## 2021-12-07 DIAGNOSIS — R42 Dizziness and giddiness: Secondary | ICD-10-CM | POA: Diagnosis not present

## 2021-12-07 DIAGNOSIS — Z01810 Encounter for preprocedural cardiovascular examination: Secondary | ICD-10-CM | POA: Diagnosis not present

## 2021-12-07 DIAGNOSIS — E1169 Type 2 diabetes mellitus with other specified complication: Secondary | ICD-10-CM | POA: Diagnosis not present

## 2021-12-07 DIAGNOSIS — E785 Hyperlipidemia, unspecified: Secondary | ICD-10-CM | POA: Diagnosis not present

## 2021-12-07 DIAGNOSIS — I5032 Chronic diastolic (congestive) heart failure: Secondary | ICD-10-CM | POA: Diagnosis not present

## 2021-12-07 DIAGNOSIS — I700 Atherosclerosis of aorta: Secondary | ICD-10-CM

## 2021-12-07 NOTE — Assessment & Plan Note (Addendum)
Blood pressure looks relatively stable today.  NYHA class II symptoms mostly because of her baseline dyspnea.  She does have some edema but does not have any take any extra Lasix.  She is on combination of carvedilol diltiazem and Micardis. She is on a stable dose of Lasix, usually taken once a day but it can take additional doses PRN weight gain or dyspnea.-Discussed sliding scale)  She is on GLP-1 agonist -> because of supply issues, not currently on Trulicity, trying to switch over to Ozempic.  Continue current medication regimen.  Defer management of diabetes to PCP.

## 2021-12-07 NOTE — Assessment & Plan Note (Signed)
Pending right rotator cuff surgery in the next few days.  (Reportedly on 12/09/2021).  No active angina symptoms besides some mild swelling and stable to improved dyspnea.  EKGs seems stable.  No active angina or CHF symptoms.  Edema controlled.  No arrhythmias.  EKG relatively benign, stable compared to previous evaluation.  I have cleared for low risk surgery on a low risk patient.  Her major  perioperative risks are probably noncardiac and more related to pulmonary disease.  With no active symptoms, I would not pursue any noninvasive testing.  On stable dose of beta-blocker.

## 2021-12-07 NOTE — Assessment & Plan Note (Signed)
Well contolled with combination of Beta Blocker & CCB -- Carvedilol 12.5 mg BID & Diltiazem CR 120 mg.  No SSx to suggest Afib/Flutter

## 2021-12-07 NOTE — Progress Notes (Signed)
Primary Care Provider: [name], MD Cardiologist: [name], MD Electrophysiologist: None  Clinic Note: Chief Complaint  Patient presents with   Follow-up    Annual.  Doing well.   Pre-op Exam    Upcoming right rotator cuff surgery   ===================================  ASSESSMENT/PLAN   Problem List Items Addressed This Visit       Cardiology Problems   Hypertensive heart disease with chronic diastolic congestive heart failure (HCC) (Chronic)    Blood pressure looks relatively stable today.  NYHA class II symptoms mostly because of her baseline dyspnea.  She does have some edema but does not have any take any extra Lasix.  She is on combination of carvedilol diltiazem and Micardis. She is on a stable dose of Lasix, usually taken once a day but it can take additional doses PRN weight gain or dyspnea.-Discussed sliding scale)  She is on GLP-1 agonist -> because of supply issues, not currently on Trulicity, trying to switch over to Ozempic.  Continue current medication regimen.  Defer management of diabetes to PCP.      Relevant Orders   EKG 12-Lead   Hyperlipidemia associated with type 2 diabetes mellitus (HCC) (Chronic)     LDL has been stable at 84 now for 2 years.  This is on 40 of atorvastatin along with fenofibrate.  Triglycerides still elevated which is likely related to her diabetes and glycemic control.  Last documented A1c was 7.4 this is after a roller coaster up-and-down.  As mentioned, switched from Trulicity now to Sunol because of supply chain issues.  Hopefully weight loss will help with decrease triglyceride levels and therefore improved overall cholesterol.  Continue current dose of statin and fenofibrate.  No signs of myalgias.  Low threshold to consider switching from fibrate to Vascepa.      Aortic atherosclerosis (HCC) (Chronic)    Incidentally found on CT scan.  Restricted modification with glycemic and lipid control as well as blood  pressure control.      Postural dizziness with presyncope (Chronic)    Try to avoid significant hypotension.  She is on carvedilol and diltiazem and telmisartan, but shooting for borderline permissive hypertension.  Blood pressure looks good today        Other   Preop cardiovascular exam    Pending right rotator cuff surgery in the next few days.  (Reportedly on 12/09/2021).  No active angina symptoms besides some mild swelling and stable to improved dyspnea.  EKGs seems stable.  No active angina or CHF symptoms.  Edema controlled.  No arrhythmias.  EKG relatively benign, stable compared to previous evaluation.  I have cleared for low risk surgery on a low risk patient.  Her major  perioperative risks are probably noncardiac and more related to pulmonary disease.  With no active symptoms, I would not pursue any noninvasive testing.  On stable dose of beta-blocker.        Rapid palpitations - Primary (Chronic)    Well contolled with combination of Beta Blocker & CCB -- Carvedilol 12.5 mg BID & Diltiazem CR 120 mg.  No SSx to suggest Afib/Flutter       Relevant Orders   EKG 12-Lead   Other Visit Diagnoses     Pre-operative cardiovascular examination       Relevant Orders   EKG 12-Lead       ===================================  HPI:     CARMACK is a 61 y.o. female with a PMH Notable for Chronic Sarcoidosis, DM-2 (  on insulin), HLD, HTN and mild HFpEF as well as possible COPD, and palpitations who presents today for annual follow-up.  She is being seen at the request of [name], MD.  RIGHT AND LEFT HEART CATH in 2014 ---> angiographic normal coronary arteries with essentially normal right heart pressures..  Follow-up CATH [id number] -ANGIOGRAPHICALLY NORMAL with mild nonobstructive disease in the LAD.  Normal LVEDP.  KENEISHA HECKART was last seen on November 03, 2020.  As a hospital follow-up after back-to-back hospitalizations for left femur fracture.  Initial  episode was 08/07/2020 with left femur ORIF on the 08/08/20.  She then came back in on November 19th of 2021 with recurrent fracture of the prosthesis.  This was a delayed follow-up because of her femur fractures.  Was having some sinusitis issues but was overall doing better.  Limited mobility because of her recent surgeries.  Stable dyspnea with occasional wheezing.  Stable pulmonary sarcoid.  Well-controlled palpitations. => Diuretic dose to be reduced to once daily during hospital stay.  Palpitations well-controlled with carvedilol and diltiazem,.  Discussed sliding scale Lasix.  Recent Hospitalizations: None  Reviewed  CV studies:    The following studies were reviewed today: (if available, images/films reviewed: From Epic Chart or Care Everywhere) None:  Interval History:   LINSY EHRESMAN presents today for cardiology follow-up overall doing pretty well from a cardiac standpoint.  She still has her baseline dyspnea, but recently went down for a trip with her family to [place] and walked her entire family.  No significant dyspnea with rest or exertion or chest pain.  No significant PND, orthopnea or edema.  Energy level overall is doing better since she is lost and has maintained weight loss.  This is probably related to her being on Trulicity.  Unfortunately because of availability of Trulicity, she was switched to Ozempic.  They are now able to titrate her up once a Ozempic.  She did not do well with the first shot.  Her A1c had dropped down to 6.7, but having stopped Trulicity is now back up to over 8.  Hoping with Ozempic this will improve.  No recent Sarcoid Spells - Dr. Casper Harrison retired.   CV Review of Symptoms (Summary) Cardiovascular ROS: positive for - mild baseline SOB/ DOE - but much better negative for - chest pain, edema, irregular heartbeat, orthopnea, palpitations, paroxysmal nocturnal dyspnea, rapid heart rate, or lightheadedness, dizziness, syncope/near syncope,  TIA/amaurosis fugax  REVIEWED OF SYSTEMS   Review of Systems  Constitutional:  Negative for malaise/fatigue (Energy actually seems to doing better.) and weight loss (Stable blood pressure now.  No further loss.).  HENT:  Positive for congestion (We well-controlled). Negative for nosebleeds.   Respiratory:  Positive for shortness of breath (Pretty much down to-baseline). Negative for cough, sputum production and wheezing.   Cardiovascular:        Per HPI  Gastrointestinal:  Negative for abdominal pain, blood in stool and melena.  Genitourinary:  Negative for dysuria and hematuria.  Musculoskeletal:  Positive for joint pain (For arthritis pains.  More controlled.). Negative for falls and myalgias.  Neurological:  Negative for dizziness (She still has some mild dizziness, but not significant.) and weakness.  Psychiatric/Behavioral:  Negative for depression and memory loss. The patient is not nervous/anxious and does not have insomnia.  .  I have reviewed and (if needed) personally updated the patient's problem list, medications, allergies, past medical and surgical history, social and family history.   PAST MEDICAL HISTORY  Past Medical History:  Diagnosis Date   Abnormal SPEP 04/17/2014   Acute left ankle pain 01/26/2017   ANEMIA-UNSPECIFIED 09/18/2009   Anxiety    Arthritis    Blood transfusion without reported diagnosis    Cataract    CHF (congestive heart failure) (HCC)    Chronic diastolic heart failure, NYHA class 2 (HCC)    Normal LVEDP by May 2018   COPD (chronic obstructive pulmonary disease) ([place])    Depression    DIABETES MELLITUS, TYPE II 08/21/2006   Diabetic osteomyelitis ([place]) 05/29/2015   Fatty liver    Fracture of 5th metatarsal    non union   GERD 81/10/7508   Had fundoplication   GOUT 25/85/2778   Hammer toe of right foot    2-5th toes   Hx of umbilical hernia repair    HYPERLIPIDEMIA 08/21/2006   HYPERTENSION 08/21/2006   Infection of wound due to  methicillin resistant Staphylococcus aureus (MRSA)    Internal hemorrhoids    Kidney problem    Multiple allergies 10/14/2016   OBESITY 06/04/2009   Onychomycosis 10/27/2015   Osteomyelitis of left foot ([place]) 05/29/2015   Osteoporosis    Pulmonary sarcoidosis ([place])    Followed locally by pulmonology, but also by Dr. Casper Harrison at [facility]   Right knee pain 01/26/2017   Vocal cord dysfunction    Wears partial dentures     PAST SURGICAL HISTORY   Past Surgical History:  Procedure Laterality Date   ABDOMINAL HYSTERECTOMY     APPENDECTOMY     BLADDER SUSPENSION  11/11/2011   Procedure: TRANSVAGINAL TAPE (TVT) PROCEDURE;  Surgeon: Olga Millers, MD;  Location: [facility];  Service: Gynecology;  Laterality: N/A;   CAROTIDS  02/18/11   CAROTID DUPLEX; VERTEBRALS ARE PATENT WITH ANTEGRADE FLOW. ICA/CCA RATIO 1.61 ON RIGHT AND 0.75 ON LEFT   CATARACT EXTRACTION, BILATERAL     CHOLECYSTECTOMY  1984   COLONOSCOPY  04/29/2010   [name]   CYSTOSCOPY  11/11/2011   Procedure: CYSTOSCOPY;  Surgeon: Olga Millers, MD;  Location: [facility];  Service: Gynecology;  Laterality: N/A;   EXTUBATION (ENDOTRACHEAL) IN OR N/A 09/23/2017   Procedure: EXTUBATION (ENDOTRACHEAL) IN OR;  Surgeon: Helayne Seminole, MD;  Location: [place];  Service: ENT;  Laterality: N/A;   FIBEROPTIC LARYNGOSCOPY AND TRACHEOSCOPY N/A 09/23/2017   Procedure: FLEXIBLE FIBEROPTIC LARYNGOSCOPY;  Surgeon: Helayne Seminole, MD;  Location: [place];  Service: ENT;  Laterality: N/A;   FRACTURE SURGERY     foot   [place] Left 10/02/2018   Procedure: HALLUX ARTHRODESIS;  Surgeon: Edrick Kins, DPM;  Location: [facility];  Service: Podiatry;  Laterality: Left;   HAMMER TOE SURGERY Left 10/02/2018   Procedure: HAMMER TOE CORRECTION 2ND 3RD 4RD FIFTH TOE;  Surgeon: Edrick Kins, DPM;  Location: [facility];  Service: Podiatry;  Laterality: Left;   HAMMER TOE SURGERY Right 04/12/2019   Procedure: HAMMER TOE CORRECTION, 2ND, 3RD, 4TH AND  5TH TOES OF RIGHT FOOT;  Surgeon: Edrick Kins, DPM;  Location: [place];  Service: Podiatry;  Laterality: Right;   HAMMER TOE SURGERY Left 10/25/2019   Procedure: HAMMER TOE REPAIR SECOND, THIRD, FOUTH;  Surgeon: Edrick Kins, DPM;  Location: [facility];  Service: Podiatry;  Laterality: Left;   HERNIA REPAIR     I & D EXTREMITY Left 06/27/2015   Procedure: Partial Excision Left Calcaneus, Place Antibiotic Beads, and Wound VAC;  Surgeon: Newt Minion, MD;  Location: [facility]  OR;  Service: Orthopedics;  Laterality: Left;   JOINT REPLACEMENT     KNEE ARTHROSCOPY     right   LEFT AND RIGHT HEART CATHETERIZATION WITH CORONARY ANGIOGRAM N/A 04/23/2013   Procedure: LEFT AND RIGHT HEART CATHETERIZATION WITH CORONARY ANGIOGRAM;  Surgeon: Leonie Man, MD;  Location: [facility] CATH LAB;  Service: Cardiovascular;  Laterality: N/A;   LEFT HEART CATH AND CORONARY ANGIOGRAPHY N/A 03/11/2017   Procedure: Left Heart Cath and Coronary Angiography;  Surgeon: Sherren Mocha, MD;  Location: [place] CV LAB; angiographically minimal CAD in the LAD otherwise normal.;  Normal LVEDP.  FALSE POSITIVE MYOVIEW   LEFT HEART CATH AND CORONARY ANGIOGRAPHY  07/20/2010   LVEF 50-55% WITH VERY MILD GLOBAL HYPOKINESIA; ESSENTIALLY NORMAL CORONARY ARTERIES; NORMAL LV FUNCTION   METATARSAL OSTEOTOMY WITH OPEN REDUCTION INTERNAL FIXATION (ORIF) METATARSAL WITH FUSION Left 04/09/2014   Procedure: LEFT FOOT FRACTURE OPEN TREATMENT METATARSAL INCLUDES INTERNAL FIXATION EACH;  Surgeon: Lorn Junes, MD;  Location: [place];  Service: Orthopedics;  Laterality: Left;   NISSEN FUNDOPLICATION  6269   NM MYOVIEW LTD --> FALSE POSITIVE  03/10/2017   : Moderate size "stress-induced "perfusion defect at the apex as well as "ill-defined stress-induced perfusion defect in the lateral wall.  EF 55%.  INTERMEDIATE risk. -->  FALSE POSITIVE   ORIF DISTAL FEMUR FRACTURE  08/08/2020   [facility] [place]: ORIF of left extra-articular  periprosthetic distal femur fracture with synthesis of the femur plate with cortical nonlocking screws.  (Thought to be pathologic fracture due to osteoporosis) -> after a fall   ORIF FEMUR W/ PERI-IMPLANT  08/29/2020   [facility]) revision of left femur fracture ORIF plate fixation screws; culture positive for MSSA   Right and left CARDIAC CATHETERIZATION  04/23/2013   Angiographic normal coronaries; LVEDP 20 mmHg, PCWP 12-14 mmHg, RAP 12 mmHg.; Fick CO/CI 4.9/2.2   SHOULDER ARTHROSCOPY Left 03/14/2019   Procedure: LEFT SHOULDER ARTHROSCOPY, DEBRIDEMENT, AND DECOMPRESSION;  Surgeon: Newt Minion, MD;  Location: [place];  Service: Orthopedics;  Laterality: Left;   TOTAL KNEE ARTHROPLASTY Right 06/29/2017   Procedure: RIGHT TOTAL KNEE ARTHROPLASTY;  Surgeon: Newt Minion, MD;  Location: [place];  Service: Orthopedics;  Laterality: Right;   TOTAL KNEE ARTHROPLASTY Left 12/07/2017   TOTAL KNEE ARTHROPLASTY Left 12/07/2017   Procedure: LEFT TOTAL KNEE ARTHROPLASTY;  Surgeon: Newt Minion, MD;  Location: [place];  Service: Orthopedics;  Laterality: Left;   TRACHEOSTOMY TUBE PLACEMENT N/A 09/20/2017   Procedure: AWAKE INTUBATION WITH ANESTHESIA WITH VIDEO ASSISTANCE;  Surgeon: Helayne Seminole, MD;  Location: [place] [facility];  Service: ENT;  Laterality: N/A;   TRANSTHORACIC ECHOCARDIOGRAM  08/2014   Normal LV size and function.  Mild LVH.  EF 55-60%.  Normal regional wall motion.  GR 1 DD.  Normal RV size and function .   TRANSTHORACIC ECHOCARDIOGRAM  08/12/2020    [facility]): Normal LV size and function.  EF 55 to 60%.  No RWM A.  Normal filling pattern.  Normal RV.  No significant valvular disease-stenosis or regurgitation.  No vegetation.   TUBAL LIGATION     with reversal in [place]      Immunization History  Administered Date(s) Administered   H1N1 09/24/2008   Hep A / Hep B 02/20/2018, 04/03/2018, 08/23/2018    Influenza Inj Mdck Quad With Preservative 07/12/2019   Influenza Split 09/24/2008,  07/07/2011, 07/18/2012, 06/12/2013, 07/15/2014, 08/08/2015, 06/22/2016, 07/01/2017, 08/16/2017, 07/14/2018, 07/04/2019, 07/12/2019, 07/26/2019, 07/02/2020   Influenza Whole 08/15/2007, 06/26/2008, 07/23/2009, 07/16/2010   Influenza, Seasonal, Injecte, Preservative Fre 10/26/2011, 07/18/2012   Influenza,inj,Quad PF,6+ Mos 06/12/2013, 07/15/2014, 08/08/2015, 06/22/2016, 07/01/2017, 07/14/2018, 07/04/2019, 07/02/2020, 07/07/2021   Influenza-Unspecified 09/24/2008, 07/26/2019   PFIZER Comirnaty(Gray Top)Covid-19 Tri-Sucrose Vaccine 04/15/2021   PFIZER(Purple Top)SARS-COV-2 Vaccination 12/26/2019, 01/16/2020, 04/15/2021, 07/08/2021   Pneumococcal Conjugate-13 05/06/2014   Pneumococcal Polysaccharide-23 09/30/2008, 02/13/2013   Td 05/21/2010   Tdap 05/21/2010, 07/02/2020   Zoster Recombinat (Shingrix) 03/02/2017, 05/02/2017    MEDICATIONS/ALLERGIES   Current Meds  Medication Sig   albuterol (PROVENTIL HFA;VENTOLIN HFA) 108 (90 Base) MCG/ACT inhaler Inhale 1-2 puffs into the lungs every 6 (six) hours as needed for wheezing or shortness of breath.   allopurinol (ZYLOPRIM) 300 MG tablet TAKE 1 TABLET BY MOUTH EVERY DAY   amoxicillin-clavulanate (AUGMENTIN) 875-125 MG tablet Take 1 tablet by mouth 2 (two) times daily.   aspirin EC 81 MG tablet Take 1 tablet (81 mg total) by mouth daily.   atorvastatin (LIPITOR) 40 MG tablet TAKE 1 TABLET BY MOUTH EVERY DAY   benzonatate (TESSALON PERLES) 100 MG capsule Take 1 capsule (100 mg total) by mouth 3 (three) times daily as needed for cough.   BREO ELLIPTA 200-25 MCG/INH AEPB TAKE 1 PUFF BY MOUTH EVERY DAY   buPROPion (WELLBUTRIN XL) 300 MG 24 hr tablet TAKE 1 TABLET BY MOUTH EVERY DAY   carvedilol (COREG) 12.5 MG tablet Take 12.5 mg by mouth 2 (two) times daily with a meal.   Cholecalciferol 25 MCG (1000 UT) capsule Take 1,000 Units by mouth in the morning and at bedtime.    diclofenac Sodium (VOLTAREN) 1 % GEL Apply 1 application topically as needed (pain.).    diltiazem (CARDIZEM CD) 120 MG 24 hr capsule Take 1 capsule (120 mg total) by mouth daily.   famotidine (PEPCID) 20 MG tablet Take 1 tablet (20 mg total) by mouth at bedtime.   fenofibrate 160 MG tablet TAKE 1 TABLET BY MOUTH EVERY DAY   fluticasone (FLONASE) 50 MCG/ACT nasal spray Place 2 sprays into both nostrils daily as needed for allergies.   furosemide (LASIX) 40 MG tablet TAKE 1 TABLET BY MOUTH TWICE A DAY   gabapentin (NEURONTIN) 100 MG capsule TAKE 1 CAPSULE BY MOUTH THREE TIMES A DAY   glucose blood (CONTOUR NEXT TEST) test strip 1 each by Other route 4 (four) times daily. And lancets 4/day   HYDROcodone-acetaminophen (NORCO) 5-325 MG tablet Take 1 tablet by mouth every 4 (four) hours as needed for moderate pain.   insulin aspart (NOVOLOG FLEXPEN) 100 UNIT/ML FlexPen Inject 15 Units into the skin daily with supper.   insulin glargine (LANTUS SOLOSTAR) 100 UNIT/ML Solostar Pen Inject 16 Units into the skin every morning.   Insulin Pen Needle (PEN NEEDLES) 32G X 4 MM MISC 1 each by Does not apply route 4 (four) times daily. E11.9   ipratropium (ATROVENT) 0.03 % nasal spray PLACE 2 SPRAYS INTO BOTH NOSTRILS EVERY 12 (TWELVE) HOURS.   KLOR-CON M20 20 MEQ tablet TAKE 1 & 1/2 TABLETS BY MOUTH TWICE A DAY   lactulose (CHRONULAC) 10 GM/15ML solution TAKE 15 MLS BY MOUTH DAILY AS NEEDED FOR CONSTIPATION.   Lancet Devices (EASY MINI EJECT LANCING DEVICE) MISC Use as directed 4 times daily to test blood sugar   LINZESS 290 MCG CAPS capsule TAKE 1 CAPSULE BY MOUTH DAILY BEFORE BREAKFAST.   LORazepam (ATIVAN) 0.5 MG tablet Take 0.5  mg by mouth every 12 (twelve) hours as needed for anxiety.   meloxicam (MOBIC) 15 MG tablet Take 1 tablet (15 mg total) by mouth daily.   metFORMIN (GLUCOPHAGE-XR) 500 MG 24 hr tablet Take 4 tablets (2,000 mg total) by mouth daily with breakfast.   methocarbamol (ROBAXIN) 500 MG  tablet Take 1 tablet (500 mg total) by mouth every 8 (eight) hours as needed for muscle spasms.   metoCLOPramide (REGLAN) 10 MG tablet Take 1 tablet (10 mg total) by mouth every 6 (six) hours.   Microlet Lancets MISC 1 each by Does not apply route 2 (two) times daily. E11.9   nitroGLYCERIN (NITROSTAT) 0.4 MG SL tablet Place 1 tablet (0.4 mg total) under the tongue every 5 (five) minutes as needed for chest pain. Reported on 01/05/2016   nystatin (MYCOSTATIN) 100000 UNIT/ML suspension nystatin 100,000 unit/mL oral suspension  SWISH, GARGLE, AND SPIT 20 ML 4 TIMES A DAY UNTIL RESOLVED   ondansetron (ZOFRAN-ODT) 4 MG disintegrating tablet Take 1 tablet (4 mg total) by mouth every 8 (eight) hours as needed for nausea or vomiting.   RABEprazole (ACIPHEX) 20 MG tablet Take 1 tablet (20 mg total) by mouth daily.   Semaglutide, 2 MG/DOSE, (OZEMPIC, 2 MG/DOSE,) 8 MG/3ML SOPN Inject 2 mg into the skin once a week.   Semaglutide,0.25 or 0.5MG /DOS, (OZEMPIC, 0.25 OR 0.5 MG/DOSE,) 2 MG/1.5ML SOPN Inject 0.5 mg into the skin once a week.   telmisartan (MICARDIS) 20 MG tablet TAKE 1 TABLET BY MOUTH EVERY DAY   venlafaxine XR (EFFEXOR-XR) 75 MG 24 hr capsule TAKE 1 CAPSULE BY MOUTH TWICE A DAY    Allergies  Allergen Reactions   Methotrexate Other (See Comments)    Peri-oral and buccal lesions   Vancomycin Other (See Comments)    DOSE RELATED NEPHROTOXICITY   Lisinopril Cough   Chlorhexidine Itching   Clindamycin/Lincomycin Nausea And Vomiting and Rash   Doxycycline Rash   Teflaro [Ceftaroline] Rash and Other (See Comments)    Tolerates ceftriaxone     SOCIAL HISTORY/FAMILY HISTORY   Reviewed in Epic:  Pertinent findings:  Social History   Tobacco Use   Smoking status: Never   Smokeless tobacco: Never  Vaping Use   Vaping Use: Never used  Substance Use Topics   Alcohol use: No    Alcohol/week: 0.0 standard drinks   Drug use: No   Social History   Social History Narrative   Married 1994.  2 sons who both live close and 1 grandson.       Disability due to sarcoidosis. 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    Appearance: She is obese. She is not ill-appearing or toxic-appearing.  HENT:     Head: Normocephalic and atraumatic.  Cardiovascular:     Rate and Rhythm: Normal rate  and regular rhythm. Occasional Extrasystoles are present.    Chest Wall: PMI is not displaced (Difficult to palpate).     Pulses: Decreased pulses (Difficult to palpate due to body habitus/edema.).     Heart sounds: S1 normal and S2 normal. Heart sounds are distant. No murmur heard.   No friction rub. No gallop.  Pulmonary:     Effort: Pulmonary effort is normal.     Breath sounds: Normal breath sounds. No wheezing, rhonchi or rales.     Comments: Mild interstitial sounds, no wheezes rales or rhonchi. Abdominal:     General: Abdomen is flat.     Palpations: Abdomen is soft.     Comments: Mild truncal obesity.  Musculoskeletal:        General: Swelling (Trivial left and 1+) present.     Cervical back: Normal range of motion and neck supple.     Right lower leg: Edema present.     Left lower leg: Edema present.  Skin:    General: Skin is dry.     Findings: Bruising present.  Neurological:     Mental Status: She is alert.         Adult ECG Report  Rate: 86 ;  Rhythm: normal sinus rhythm; Normal axis, intervals & durations; CRO Ant/Sept MI, age indeterminate   Narrative Interpretation:  stable - no change  Recent Labs: Reviewed Lab Results  Component Value Date   CHOL 164 07/21/2021   HDL 49.00 07/21/2021   LDLCALC 84 03/31/2020   LDLDIRECT 84.0 07/21/2021   TRIG 260.0 (H) 07/21/2021   CHOLHDL 3 07/21/2021   Lab Results  Component Value Date   CREATININE 1.35 (H) 11/17/2021   BUN 27 (H) 11/17/2021   NA 135 11/17/2021   K 4.7 11/17/2021   CL 97 (L) 11/17/2021   CO2 26 11/17/2021   CBC Latest Ref Rng & Units 11/17/2021 11/13/2021 07/21/2021  WBC 4.0 - 10.5 K/uL 16.0(H) 9.0 10.0  Hemoglobin 12.0 - 15.0 g/dL 11.8(L) 10.6(L) 11.0(L)  Hematocrit 36.0 - 46.0 % 36.0 32.4(L) 32.4(L)  Platelets 150 - 400 K/uL 380 282 299.0    Lab Results  Component Value Date   HGBA1C 8.7 (A) 12/03/2021   Lab Results  Component Value Date   TSH 1.94 12/19/2020    ==================================================  COVID-19 Education: The signs and symptoms of COVID-19 were discussed with the patient and how to seek care for testing (follow up with PCP or arrange E-visit).    I spent a total of 21 minutes with the patient spent in direct patient consultation.  Additional time spent with chart review  / charting (studies, outside notes, etc): 25 min Total Time: 46 min  Current medicines are reviewed at length with the patient today.  (+/- concerns) n/a  This visit occurred during the SARS-CoV-2 public health emergency.  Safety protocols were in place, including screening questions prior to the visit, additional usage of staff PPE, and extensive cleaning of exam room while observing appropriate contact time as indicated for disinfecting solutions.  Notice: This dictation was prepared with Dragon dictation along with smart phrase technology. Any transcriptional errors that result from this process are unintentional and may not be corrected upon review.  Studies Ordered:   Orders Placed This Encounter  Procedures   EKG 12-Lead    Patient Instructions / Medication Changes &  Studies & Tests Ordered   Patient Instructions  Medication Instructions:   No changes    *If you need a refill on  your cardiac medications before your next appointment, please call your pharmacy*   Lab Work:  Not needed   Testing/Procedures:  Not needed  Follow-Up: At [facility], you and your health needs are our priority.  As part of our continuing mission to provide you with exceptional heart care, we have created designated Provider Care Teams.  These Care Teams include your primary Cardiologist (physician) and Advanced Practice Providers (APPs -  Physician Assistants and Nurse Practitioners) who all work together to provide you with the care you need, when you need it.     Your next appointment:   12 month(s)  The format for your next appointment:   In Person  Provider:   [name], MD    Other Instructions   From a cardiac standpoint - you have clearance for upcoming surgery on December 09, 2021.     [name], M.D., M.S. Interventional Cardiologist   Pager # [phone] Phone # [phone] [address] [place], [place] [address]   Thank you for choosing Heartcare at [facility]!!

## 2021-12-07 NOTE — Assessment & Plan Note (Signed)
Try to avoid significant hypotension.  She is on carvedilol and diltiazem and telmisartan, but shooting for borderline permissive hypertension.  Blood pressure looks good today

## 2021-12-07 NOTE — Assessment & Plan Note (Addendum)
LDL has been stable at 84 now for 2 years.  This is on 40 of atorvastatin along with fenofibrate.  Triglycerides still elevated which is likely related to her diabetes and glycemic control.  Last documented A1c was 7.4 this is after a roller coaster up-and-down.  As mentioned, switched from Trulicity now to [place] because of supply chain issues.  Hopefully weight loss will help with decrease triglyceride levels and therefore improved overall cholesterol.  Continue current dose of statin and fenofibrate.  No signs of myalgias.  Low threshold to consider switching from fibrate to Vascepa.

## 2021-12-07 NOTE — Assessment & Plan Note (Signed)
Incidentally found on CT scan.  Restricted modification with glycemic and lipid control as well as blood pressure control.

## 2021-12-07 NOTE — Patient Instructions (Signed)
Medication Instructions:   No changes    *If you need a refill on your cardiac medications before your next appointment, please call your pharmacy*   Lab Work:  Not needed   Testing/Procedures:  Not needed  Follow-Up: At [facility], you and your health needs are our priority.  As part of our continuing mission to provide you with exceptional heart care, we have created designated Provider Care Teams.  These Care Teams include your primary Cardiologist (physician) and Advanced Practice Providers (APPs -  Physician Assistants and Nurse Practitioners) who all work together to provide you with the care you need, when you need it.     Your next appointment:   12 month(s)  The format for your next appointment:   In Person  Provider:   [name], MD    Other Instructions   From a cardiac standpoint - you have clearance for upcoming surgery on December 09, 2021.

## 2021-12-09 DIAGNOSIS — M75101 Unspecified rotator cuff tear or rupture of right shoulder, not specified as traumatic: Secondary | ICD-10-CM | POA: Diagnosis not present

## 2021-12-09 DIAGNOSIS — G8918 Other acute postprocedural pain: Secondary | ICD-10-CM | POA: Diagnosis not present

## 2021-12-09 DIAGNOSIS — M75121 Complete rotator cuff tear or rupture of right shoulder, not specified as traumatic: Secondary | ICD-10-CM | POA: Diagnosis not present

## 2021-12-09 HISTORY — PX: ROTATOR CUFF REPAIR: SHX139

## 2021-12-14 DIAGNOSIS — M7551 Bursitis of right shoulder: Secondary | ICD-10-CM | POA: Diagnosis not present

## 2021-12-14 DIAGNOSIS — Z789 Other specified health status: Secondary | ICD-10-CM | POA: Diagnosis not present

## 2021-12-14 DIAGNOSIS — M75101 Unspecified rotator cuff tear or rupture of right shoulder, not specified as traumatic: Secondary | ICD-10-CM | POA: Diagnosis not present

## 2021-12-14 DIAGNOSIS — M25511 Pain in right shoulder: Secondary | ICD-10-CM | POA: Diagnosis not present

## 2021-12-14 DIAGNOSIS — R29898 Other symptoms and signs involving the musculoskeletal system: Secondary | ICD-10-CM | POA: Diagnosis not present

## 2021-12-17 ENCOUNTER — Other Ambulatory Visit: Payer: Self-pay | Admitting: Orthopaedic Surgery

## 2021-12-17 ENCOUNTER — Other Ambulatory Visit: Payer: Self-pay | Admitting: Family Medicine

## 2021-12-23 DIAGNOSIS — R29898 Other symptoms and signs involving the musculoskeletal system: Secondary | ICD-10-CM | POA: Diagnosis not present

## 2021-12-23 DIAGNOSIS — Z789 Other specified health status: Secondary | ICD-10-CM | POA: Diagnosis not present

## 2021-12-23 DIAGNOSIS — M25511 Pain in right shoulder: Secondary | ICD-10-CM | POA: Diagnosis not present

## 2021-12-23 DIAGNOSIS — M7551 Bursitis of right shoulder: Secondary | ICD-10-CM | POA: Diagnosis not present

## 2021-12-23 DIAGNOSIS — M75101 Unspecified rotator cuff tear or rupture of right shoulder, not specified as traumatic: Secondary | ICD-10-CM | POA: Diagnosis not present

## 2021-12-24 ENCOUNTER — Telehealth: Payer: Self-pay

## 2021-12-24 NOTE — Telephone Encounter (Signed)
Letter received by mail via pt spouse asking to have a letter written to excuse pt from [employer]. Ok to write? ?

## 2021-12-24 NOTE — Telephone Encounter (Signed)
Letter has been written and placed up front, pt notified.  ?

## 2021-12-24 NOTE — Telephone Encounter (Signed)
To whom it may concern, ? ?Patient has multiple chronic illnesses and would be very high risk for QMGNO-03 complications-I recommend she be excused from jury duty. ? ?Thanks, ?Garret Reddish, MD ?

## 2022-01-03 ENCOUNTER — Other Ambulatory Visit: Payer: Self-pay | Admitting: Cardiology

## 2022-01-03 ENCOUNTER — Other Ambulatory Visit: Payer: Self-pay | Admitting: Family Medicine

## 2022-01-06 DIAGNOSIS — R29898 Other symptoms and signs involving the musculoskeletal system: Secondary | ICD-10-CM | POA: Diagnosis not present

## 2022-01-06 DIAGNOSIS — M25511 Pain in right shoulder: Secondary | ICD-10-CM | POA: Diagnosis not present

## 2022-01-06 DIAGNOSIS — M75101 Unspecified rotator cuff tear or rupture of right shoulder, not specified as traumatic: Secondary | ICD-10-CM | POA: Diagnosis not present

## 2022-01-06 DIAGNOSIS — Z789 Other specified health status: Secondary | ICD-10-CM | POA: Diagnosis not present

## 2022-01-06 DIAGNOSIS — M7551 Bursitis of right shoulder: Secondary | ICD-10-CM | POA: Diagnosis not present

## 2022-01-11 DIAGNOSIS — M7551 Bursitis of right shoulder: Secondary | ICD-10-CM | POA: Diagnosis not present

## 2022-01-11 DIAGNOSIS — Z789 Other specified health status: Secondary | ICD-10-CM | POA: Diagnosis not present

## 2022-01-11 DIAGNOSIS — M75101 Unspecified rotator cuff tear or rupture of right shoulder, not specified as traumatic: Secondary | ICD-10-CM | POA: Diagnosis not present

## 2022-01-11 DIAGNOSIS — M25511 Pain in right shoulder: Secondary | ICD-10-CM | POA: Diagnosis not present

## 2022-01-13 ENCOUNTER — Encounter: Payer: Self-pay | Admitting: Family Medicine

## 2022-01-13 ENCOUNTER — Ambulatory Visit (INDEPENDENT_AMBULATORY_CARE_PROVIDER_SITE_OTHER): Payer: BC Managed Care – PPO | Admitting: Family Medicine

## 2022-01-13 VITALS — BP 132/76 | HR 87 | Temp 98.0°F | Ht 66.0 in | Wt 217.5 lb

## 2022-01-13 DIAGNOSIS — R300 Dysuria: Secondary | ICD-10-CM

## 2022-01-13 LAB — POCT URINALYSIS DIPSTICK
Bilirubin, UA: NEGATIVE
Blood, UA: NEGATIVE
Glucose, UA: NEGATIVE
Ketones, UA: NEGATIVE
Nitrite, UA: NEGATIVE
Protein, UA: POSITIVE — AB
Spec Grav, UA: 1.015 (ref 1.010–1.025)
Urobilinogen, UA: 0.2 E.U./dL
pH, UA: 6 (ref 5.0–8.0)

## 2022-01-13 MED ORDER — PHENAZOPYRIDINE HCL 100 MG PO TABS: 100.0000 mg | ORAL_TABLET | Freq: Three times a day (TID) | ORAL | 0 refills | Status: DC | PRN

## 2022-01-13 MED ORDER — AMOXICILLIN-POT CLAVULANATE 875-125 MG PO TABS: 1.0000 | ORAL_TABLET | Freq: Two times a day (BID) | ORAL | 0 refills | Status: DC

## 2022-01-13 NOTE — Patient Instructions (Signed)
Meds have been sent the the pharmacy ?You can take tylenol for pain/fevers ?If worsening symptoms, let us know or go to the Emergency room  ? ?Drink plenty ?

## 2022-01-13 NOTE — Progress Notes (Signed)
? ?Subjective:  ? ? ? Patient ID: [name], female    DOB: November 17, 1960, 61 y.o.   MRN: [id number] ? ?Chief Complaint  ?Patient presents with  ? Dysuria  ?  Started Monday  ? Urinary Urgency  ?  Feels like she can go, but then cannot ?  ? ? ?HPI- no abx in 2 wks ?Dysuria since 4/3.  Urgency but then small amounts.  Not emptying. Nocturia x2.  Some R back pain. ?No n/v/f/c  ? ?There are no preventive care reminders to display for this patient. ? ?Past Medical History:  ?Diagnosis Date  ? Abnormal SPEP 04/17/2014  ? Acute left ankle pain 01/26/2017  ? ANEMIA-UNSPECIFIED 09/18/2009  ? Anxiety   ? Arthritis   ? Blood transfusion without reported diagnosis   ? Cataract   ? CHF (congestive heart failure) ([place])   ? Chronic diastolic heart failure, NYHA class 2 ([place])   ? Normal LVEDP by May 2018  ? COPD (chronic obstructive pulmonary disease) ([place])   ? Depression   ? DIABETES MELLITUS, TYPE II 08/21/2006  ? Diabetic osteomyelitis ([place]) 05/29/2015  ? Fatty liver   ? Fracture of 5th metatarsal   ? non union  ? GERD 08/21/2006  ? Had fundoplication  ? GOUT 08/20/2010  ? Hammer toe of right foot   ? 2-5th toes  ? Hx of umbilical hernia repair   ? HYPERLIPIDEMIA 08/21/2006  ? HYPERTENSION 08/21/2006  ? Infection of wound due to methicillin resistant Staphylococcus aureus (MRSA)   ? Internal hemorrhoids   ? Kidney problem   ? Multiple allergies 10/14/2016  ? OBESITY 06/04/2009  ? Onychomycosis 10/27/2015  ? Osteomyelitis of left foot ([place]) 05/29/2015  ? Osteoporosis   ? Pulmonary sarcoidosis ([place])   ? Followed locally by pulmonology, but also by Dr. Casper Harrison at [facility]  ? Right knee pain 01/26/2017  ? Vocal cord dysfunction   ? Wears partial dentures   ? ? ?Past Surgical History:  ?Procedure Laterality Date  ? ABDOMINAL HYSTERECTOMY    ? APPENDECTOMY    ? BLADDER SUSPENSION  11/11/2011  ? Procedure: TRANSVAGINAL TAPE (TVT) PROCEDURE;  Surgeon: Olga Millers, MD;  Location: [facility];  Service: Gynecology;  Laterality: N/A;   ? CAROTIDS  02/18/11  ? CAROTID DUPLEX; VERTEBRALS ARE PATENT WITH ANTEGRADE FLOW. ICA/CCA RATIO 1.61 ON RIGHT AND 0.75 ON LEFT  ? CATARACT EXTRACTION, BILATERAL    ? CHOLECYSTECTOMY  1984  ? COLONOSCOPY  04/29/2010  ? [name]  ? CYSTOSCOPY  11/11/2011  ? Procedure: CYSTOSCOPY;  Surgeon: Olga Millers, MD;  Location: [facility];  Service: Gynecology;  Laterality: N/A;  ? EXTUBATION (ENDOTRACHEAL) IN OR N/A 09/23/2017  ? Procedure: EXTUBATION (ENDOTRACHEAL) IN OR;  Surgeon: Helayne Seminole, MD;  Location: [place];  Service: ENT;  Laterality: N/A;  ? FIBEROPTIC LARYNGOSCOPY AND TRACHEOSCOPY N/A 09/23/2017  ? Procedure: FLEXIBLE FIBEROPTIC LARYNGOSCOPY;  Surgeon: Helayne Seminole, MD;  Location: [place];  Service: ENT;  Laterality: N/A;  ? FRACTURE SURGERY    ? foot  ? HALLUX FUSION Left 10/02/2018  ? Procedure: HALLUX ARTHRODESIS;  Surgeon: Edrick Kins, DPM;  [facility];  Service: Podiatry;  Laterality: Left;  ? HAMMER TOE SURGERY Left 10/02/2018  ? Procedure: HAMMER TOE CORRECTION 2ND 3RD 4RD FIFTH TOE;  Surgeon: Edrick Kins, DPM;  [facility];  Service: Podiatry;  Laterality: Left;  ? HAMMER TOE SURGERY Right 04/12/2019  ? Procedure: HAMMER TOE CORRECTION, 2ND,  3RD, 4TH AND 5TH TOES OF RIGHT FOOT;  Surgeon: Edrick Kins, DPM;  Location: [place];  Service: Podiatry;  Laterality: Right;  ? HAMMER TOE SURGERY Left 10/25/2019  ? Procedure: HAMMER TOE REPAIR SECOND, THIRD, FOUTH;  Surgeon: Edrick Kins, DPM;  Location: [place];  Service: Podiatry;  Laterality: Left;  ? HERNIA REPAIR    ? I & D EXTREMITY Left 06/27/2015  ? Procedure: Partial Excision Left Calcaneus, Place Antibiotic Beads, and Wound VAC;  Surgeon: Newt Minion, MD;  Location: [place];  Service: Orthopedics;  Laterality: Left;  ? JOINT REPLACEMENT    ? KNEE ARTHROSCOPY    ? right  ? LEFT AND RIGHT HEART CATHETERIZATION WITH CORONARY ANGIOGRAM N/A 04/23/2013  ? Procedure: LEFT AND RIGHT HEART CATHETERIZATION WITH CORONARY ANGIOGRAM;  Surgeon:  Leonie Man, MD;  Location: [facility];  Service: Cardiovascular;  Laterality: N/A;  ? LEFT HEART CATH AND CORONARY ANGIOGRAPHY N/A 03/11/2017  ? Procedure: Left Heart Cath and Coronary Angiography;  Surgeon: Sherren Mocha, MD;  Location: [place] CV LAB; angiographically minimal CAD in the LAD otherwise normal.;  Normal LVEDP.  FALSE POSITIVE MYOVIEW  ? LEFT HEART CATH AND CORONARY ANGIOGRAPHY  07/20/2010  ? LVEF 50-55% WITH VERY MILD GLOBAL HYPOKINESIA; ESSENTIALLY NORMAL CORONARY ARTERIES; NORMAL LV FUNCTION  ? METATARSAL OSTEOTOMY WITH OPEN REDUCTION INTERNAL FIXATION (ORIF) METATARSAL WITH FUSION Left 04/09/2014  ? Procedure: LEFT FOOT FRACTURE OPEN TREATMENT METATARSAL INCLUDES INTERNAL FIXATION EACH;  Surgeon: Lorn Junes, MD;  Location: [place];  Service: Orthopedics;  Laterality: Left;  ? NISSEN FUNDOPLICATION  1194  ? NM MYOVIEW LTD --> FALSE POSITIVE  03/10/2017  ? : Moderate size "stress-induced "perfusion defect at the apex as well as "ill-defined stress-induced perfusion defect in the lateral wall.  EF 55%.  INTERMEDIATE risk. -->  FALSE POSITIVE  ? ORIF DISTAL FEMUR FRACTURE  08/08/2020  ? [facility]: ORIF of left extra-articular periprosthetic distal femur fracture with synthesis of the femur plate with cortical nonlocking screws.  (Thought to be pathologic fracture due to osteoporosis) -> after a fall  ? ORIF FEMUR W/ PERI-IMPLANT  08/29/2020  ? [facility]) revision of left femur fracture ORIF plate fixation screws; culture positive for MSSA  ? Right and left CARDIAC CATHETERIZATION  04/23/2013  ? Angiographic normal coronaries; LVEDP 20 mmHg, PCWP 12-14 mmHg, RAP 12 mmHg.; Fick CO/CI 4.9/2.2  ? SHOULDER ARTHROSCOPY Left 03/14/2019  ? Procedure: LEFT SHOULDER ARTHROSCOPY, DEBRIDEMENT, AND DECOMPRESSION;  Surgeon: Newt Minion, MD;  Location: [place];  Service: Orthopedics;  Laterality: Left;  ? TOTAL KNEE ARTHROPLASTY Right 06/29/2017  ? Procedure:  RIGHT TOTAL KNEE ARTHROPLASTY;  Surgeon: Newt Minion, MD;  Location: [place];  Service: Orthopedics;  Laterality: Right;  ? TOTAL KNEE ARTHROPLASTY Left 12/07/2017  ? TOTAL KNEE ARTHROPLASTY Left 12/07/2017  ? Procedure: LEFT TOTAL KNEE ARTHROPLASTY;  Surgeon: Newt Minion, MD;  Location: [place];  Service: Orthopedics;  Laterality: Left;  ? TRACHEOSTOMY TUBE PLACEMENT N/A 09/20/2017  ? Procedure: AWAKE INTUBATION WITH ANESTHESIA WITH VIDEO ASSISTANCE;  Surgeon: Helayne Seminole, MD;  Location: [place];  Service: ENT;  Laterality: N/A;  ? TRANSTHORACIC ECHOCARDIOGRAM  08/2014  ? Normal LV size and function.  Mild LVH.  EF 55-60%.  Normal regional wall motion.  GR 1 DD.  Normal RV size and function .  ? TRANSTHORACIC ECHOCARDIOGRAM  08/12/2020  ?  [facility]): Normal LV  size and function.  EF 55 to 60%.  No RWM A.  Normal filling pattern.  Normal RV.  No significant valvular disease-stenosis or regurgitation.  No vegetation.  ? TUBAL LIGATION    ? with reversal in 1994  ? UPPER GASTROINTESTINAL ENDOSCOPY    ? VENTRAL HERNIA REPAIR    ? ? ?Outpatient Medications Prior to Visit  ?Medication Sig Dispense Refill  ? albuterol (PROVENTIL HFA;VENTOLIN HFA) 108 (90 Base) MCG/ACT inhaler Inhale 1-2 puffs into the lungs every 6 (six) hours as needed for wheezing or shortness of breath. 8 g 5  ? allopurinol (ZYLOPRIM) 300 MG tablet TAKE 1 TABLET BY MOUTH EVERY DAY 90 tablet 0  ? amoxicillin-clavulanate (AUGMENTIN) 875-125 MG tablet Take 1 tablet by mouth 2 (two) times daily. 10 tablet 0  ? aspirin EC 81 MG tablet Take 1 tablet (81 mg total) by mouth daily. 30 tablet 11  ? atorvastatin (LIPITOR) 40 MG tablet TAKE 1 TABLET BY MOUTH EVERY DAY 90 tablet 3  ? benzonatate (TESSALON PERLES) 100 MG capsule Take 1 capsule (100 mg total) by mouth 3 (three) times daily as needed for cough. 20 capsule 0  ? BREO ELLIPTA 200-25 MCG/INH AEPB TAKE 1 PUFF BY MOUTH EVERY DAY 60 each 5  ? buPROPion (WELLBUTRIN XL) 300 MG 24 hr  tablet TAKE 1 TABLET BY MOUTH EVERY DAY 90 tablet 3  ? carvedilol (COREG) 12.5 MG tablet Take 12.5 mg by mouth 2 (two) times daily with a meal.    ? Cholecalciferol 25 MCG (1000 UT) capsule Take 1,000 Units

## 2022-01-14 DIAGNOSIS — Y999 Unspecified external cause status: Secondary | ICD-10-CM | POA: Diagnosis not present

## 2022-01-14 DIAGNOSIS — W01198A Fall on same level from slipping, tripping and stumbling with subsequent striking against other object, initial encounter: Secondary | ICD-10-CM | POA: Diagnosis not present

## 2022-01-14 DIAGNOSIS — Y9301 Activity, walking, marching and hiking: Secondary | ICD-10-CM | POA: Diagnosis not present

## 2022-01-14 DIAGNOSIS — M25562 Pain in left knee: Secondary | ICD-10-CM | POA: Diagnosis not present

## 2022-01-16 LAB — URINE CULTURE
MICRO NUMBER:: 13225771
SPECIMEN QUALITY:: ADEQUATE

## 2022-01-18 DIAGNOSIS — M7551 Bursitis of right shoulder: Secondary | ICD-10-CM | POA: Diagnosis not present

## 2022-01-18 DIAGNOSIS — R29898 Other symptoms and signs involving the musculoskeletal system: Secondary | ICD-10-CM | POA: Diagnosis not present

## 2022-01-18 DIAGNOSIS — M25511 Pain in right shoulder: Secondary | ICD-10-CM | POA: Diagnosis not present

## 2022-01-18 DIAGNOSIS — Z789 Other specified health status: Secondary | ICD-10-CM | POA: Diagnosis not present

## 2022-01-18 DIAGNOSIS — M75101 Unspecified rotator cuff tear or rupture of right shoulder, not specified as traumatic: Secondary | ICD-10-CM | POA: Diagnosis not present

## 2022-01-21 DIAGNOSIS — M25511 Pain in right shoulder: Secondary | ICD-10-CM | POA: Diagnosis not present

## 2022-01-21 DIAGNOSIS — M7551 Bursitis of right shoulder: Secondary | ICD-10-CM | POA: Diagnosis not present

## 2022-01-21 DIAGNOSIS — M75101 Unspecified rotator cuff tear or rupture of right shoulder, not specified as traumatic: Secondary | ICD-10-CM | POA: Diagnosis not present

## 2022-01-21 DIAGNOSIS — Z789 Other specified health status: Secondary | ICD-10-CM | POA: Diagnosis not present

## 2022-01-25 ENCOUNTER — Other Ambulatory Visit: Payer: Self-pay | Admitting: Family Medicine

## 2022-01-25 DIAGNOSIS — M75101 Unspecified rotator cuff tear or rupture of right shoulder, not specified as traumatic: Secondary | ICD-10-CM | POA: Diagnosis not present

## 2022-01-25 DIAGNOSIS — M7551 Bursitis of right shoulder: Secondary | ICD-10-CM | POA: Diagnosis not present

## 2022-01-25 DIAGNOSIS — M25511 Pain in right shoulder: Secondary | ICD-10-CM | POA: Diagnosis not present

## 2022-01-25 DIAGNOSIS — Z789 Other specified health status: Secondary | ICD-10-CM | POA: Diagnosis not present

## 2022-01-28 ENCOUNTER — Other Ambulatory Visit: Payer: Self-pay | Admitting: Cardiology

## 2022-01-28 ENCOUNTER — Other Ambulatory Visit: Payer: Self-pay | Admitting: Family Medicine

## 2022-02-01 DIAGNOSIS — M25511 Pain in right shoulder: Secondary | ICD-10-CM | POA: Diagnosis not present

## 2022-02-01 DIAGNOSIS — M7551 Bursitis of right shoulder: Secondary | ICD-10-CM | POA: Diagnosis not present

## 2022-02-01 DIAGNOSIS — Z789 Other specified health status: Secondary | ICD-10-CM | POA: Diagnosis not present

## 2022-02-01 DIAGNOSIS — M75101 Unspecified rotator cuff tear or rupture of right shoulder, not specified as traumatic: Secondary | ICD-10-CM | POA: Diagnosis not present

## 2022-02-02 ENCOUNTER — Other Ambulatory Visit: Payer: Self-pay | Admitting: Family Medicine

## 2022-02-02 ENCOUNTER — Ambulatory Visit (INDEPENDENT_AMBULATORY_CARE_PROVIDER_SITE_OTHER): Payer: BC Managed Care – PPO | Admitting: Endocrinology

## 2022-02-02 VITALS — BP 150/88 | HR 86 | Wt 222.0 lb

## 2022-02-02 DIAGNOSIS — Z794 Long term (current) use of insulin: Secondary | ICD-10-CM

## 2022-02-02 DIAGNOSIS — E1129 Type 2 diabetes mellitus with other diabetic kidney complication: Secondary | ICD-10-CM | POA: Diagnosis not present

## 2022-02-02 LAB — POCT GLYCOSYLATED HEMOGLOBIN (HGB A1C): Hemoglobin A1C: 8.8 % — AB (ref 4.0–5.6)

## 2022-02-02 MED ORDER — TRULICITY 4.5 MG/0.5ML ~~LOC~~ SOAJ: 4.5000 mg | SUBCUTANEOUS | 3 refills | Status: DC

## 2022-02-02 NOTE — Progress Notes (Signed)
? ?Subjective:  ? ? Patient ID: [name], female    DOB: 1961-03-18, 61 y.o.   MRN: [id number] ? ?HPI ?Pt returns for f/u of diabetes mellitus:  ?DM type: Insulin-requiring type 2 ?Dx'ed: 2007 ?Complications: PN and mild CAD.  ?Therapy: insulin since 5465, Trulicity, and metformin.   ?GDM: never ?DKA: never ?Severe hypoglycemia: never ?Pancreatitis: never ?Pancreatic imaging: CT (2015) showed fatty atrophy.  ?Other: she takes 2 QD insulins, after poor results with multiple daily injections; she intermittently takes prednisone for sarcoidosis or AB.   ?Interval history: no recent steroids.  no cbg record, but states cbg's are still in the 100's.  There is no trend throughout the day.  She tried Ozempic, but even 0.5 mg caused N/V.  She changed to Trulicity 0.3/TWSF (started yesterday).  No nausea yet.   ?Past Medical History:  ?Diagnosis Date  ? Abnormal SPEP 04/17/2014  ? Acute left ankle pain 01/26/2017  ? ANEMIA-UNSPECIFIED 09/18/2009  ? Anxiety   ? Arthritis   ? Blood transfusion without reported diagnosis   ? Cataract   ? CHF (congestive heart failure) ([place])   ? Chronic diastolic heart failure, NYHA class 2 ([place])   ? Normal LVEDP by May 2018  ? COPD (chronic obstructive pulmonary disease) ([place])   ? Depression   ? DIABETES MELLITUS, TYPE II 08/21/2006  ? Diabetic osteomyelitis ([place]) 05/29/2015  ? Fatty liver   ? Fracture of 5th metatarsal   ? non union  ? GERD 08/21/2006  ? Had fundoplication  ? GOUT 08/20/2010  ? Hammer toe of right foot   ? 2-5th toes  ? Hx of umbilical hernia repair   ? HYPERLIPIDEMIA 08/21/2006  ? HYPERTENSION 08/21/2006  ? Infection of wound due to methicillin resistant Staphylococcus aureus (MRSA)   ? Internal hemorrhoids   ? Kidney problem   ? Multiple allergies 10/14/2016  ? OBESITY 06/04/2009  ? Onychomycosis 10/27/2015  ? Osteomyelitis of left foot ([place]) 05/29/2015  ? Osteoporosis   ? Pulmonary sarcoidosis ([place])   ? Followed locally by pulmonology, but also by Dr. Casper Harrison at [facility]  ? Right knee pain 01/26/2017  ? Vocal cord dysfunction   ? Wears partial dentures   ? ? ?Past Surgical History:  ?Procedure Laterality Date  ? ABDOMINAL HYSTERECTOMY    ? APPENDECTOMY    ? BLADDER SUSPENSION  11/11/2011  ? Procedure: TRANSVAGINAL TAPE (TVT) PROCEDURE;  Surgeon: Olga Millers, MD;  Location: [place] [facility];  Service: Gynecology;  Laterality: N/A;  ? CAROTIDS  02/18/2011  ? CAROTID DUPLEX; VERTEBRALS ARE PATENT WITH ANTEGRADE FLOW. ICA/CCA RATIO 1.61 ON RIGHT AND 0.75 ON LEFT  ? CATARACT EXTRACTION, BILATERAL    ? CHOLECYSTECTOMY  1984  ? COLONOSCOPY  04/29/2010  ? [name]  ? CYSTOSCOPY  11/11/2011  ? Procedure: CYSTOSCOPY;  Surgeon: Olga Millers, MD;  Location: [facility] [facility];  Service: Gynecology;  Laterality: N/A;  ? EXTUBATION (ENDOTRACHEAL) IN OR N/A 09/23/2017  ? Procedure: EXTUBATION (ENDOTRACHEAL) IN OR;  Surgeon: Helayne Seminole, MD;  Location: [place];  Service: ENT;  Laterality: N/A;  ? FIBEROPTIC LARYNGOSCOPY AND TRACHEOSCOPY N/A 09/23/2017  ? Procedure: FLEXIBLE FIBEROPTIC LARYNGOSCOPY;  Surgeon: Helayne Seminole, MD;  Location: [place];  Service: ENT;  Laterality: N/A;  ? FRACTURE SURGERY    ? foot  ? HALLUX FUSION Left 10/02/2018  ? Procedure: HALLUX ARTHRODESIS;  Surgeon: Edrick Kins, DPM;  [facility];  Service: Podiatry;  Laterality: Left;  ? HAMMER  TOE SURGERY Left 10/02/2018  ? Procedure: HAMMER TOE CORRECTION 2ND 3RD 4RD FIFTH TOE;  Surgeon: Edrick Kins, DPM;  [facility];  Service: Podiatry;  Laterality: Left;  ? HAMMER TOE SURGERY Right 04/12/2019  ? Procedure: HAMMER TOE CORRECTION, 2ND, 3RD, 4TH AND 5TH TOES OF RIGHT FOOT;  Surgeon: Edrick Kins, DPM;  Location: [place];  Service: Podiatry;  Laterality: Right;  ? HAMMER TOE SURGERY Left 10/25/2019  ? Procedure: HAMMER TOE REPAIR SECOND, THIRD, FOUTH;  Surgeon: Edrick Kins, DPM;  Location: [place];  Service: Podiatry;  Laterality: Left;  ? HERNIA REPAIR    ? I & D EXTREMITY Left 06/27/2015  ? Procedure:  Partial Excision Left Calcaneus, Place Antibiotic Beads, and Wound VAC;  Surgeon: Newt Minion, MD;  Location: [place];  Service: Orthopedics;  Laterality: Left;  ? JOINT REPLACEMENT    ? KNEE ARTHROSCOPY    ? right  ? LEFT AND RIGHT HEART CATHETERIZATION WITH CORONARY ANGIOGRAM N/A 04/23/2013  ? Procedure: LEFT AND RIGHT HEART CATHETERIZATION WITH CORONARY ANGIOGRAM;  Surgeon: Leonie Man, MD;  Location: [facility] CATH LAB;  Service: Cardiovascular;  Laterality: N/A;  ? LEFT HEART CATH AND CORONARY ANGIOGRAPHY N/A 03/11/2017  ? Procedure: Left Heart Cath and Coronary Angiography;  Surgeon: Sherren Mocha, MD;  Location: [place] CV LAB; angiographically minimal CAD in the LAD otherwise normal.;  Normal LVEDP.  FALSE POSITIVE MYOVIEW  ? LEFT HEART CATH AND CORONARY ANGIOGRAPHY  07/20/2010  ? LVEF 50-55% WITH VERY MILD GLOBAL HYPOKINESIA; ESSENTIALLY NORMAL CORONARY ARTERIES; NORMAL LV FUNCTION  ? METATARSAL OSTEOTOMY WITH OPEN REDUCTION INTERNAL FIXATION (ORIF) METATARSAL WITH FUSION Left 04/09/2014  ? Procedure: LEFT FOOT FRACTURE OPEN TREATMENT METATARSAL INCLUDES INTERNAL FIXATION EACH;  Surgeon: Lorn Junes, MD;  Location: [place];  Service: Orthopedics;  Laterality: Left;  ? NISSEN FUNDOPLICATION  3546  ? NM MYOVIEW LTD --> FALSE POSITIVE  03/10/2017  ? : Moderate size "stress-induced "perfusion defect at the apex as well as "ill-defined stress-induced perfusion defect in the lateral wall.  EF 55%.  INTERMEDIATE risk. -->  FALSE POSITIVE  ? ORIF DISTAL FEMUR FRACTURE  08/08/2020  ? [facility]: ORIF of left extra-articular periprosthetic distal femur fracture with synthesis of the femur plate with cortical nonlocking screws.  (Thought to be pathologic fracture due to osteoporosis) -> after a fall  ? ORIF FEMUR W/ PERI-IMPLANT  08/29/2020  ? [facility]) revision of left femur fracture ORIF plate fixation screws; culture positive for MSSA  ? Right and left CARDIAC  CATHETERIZATION  04/23/2013  ? Angiographic normal coronaries; LVEDP 20 mmHg, PCWP 12-14 mmHg, RAP 12 mmHg.; Fick CO/CI 4.9/2.2  ? ROTATOR CUFF REPAIR Right 12/09/2021  ? SHOULDER ARTHROSCOPY Left 03/14/2019  ? Procedure: LEFT SHOULDER ARTHROSCOPY, DEBRIDEMENT, AND DECOMPRESSION;  Surgeon: Newt Minion, MD;  Location: [place];  Service: Orthopedics;  Laterality: Left;  ? TOTAL KNEE ARTHROPLASTY Right 06/29/2017  ? Procedure: RIGHT TOTAL KNEE ARTHROPLASTY;  Surgeon: Newt Minion, MD;  Location: [place];  Service: Orthopedics;  Laterality: Right;  ? TOTAL KNEE ARTHROPLASTY Left 12/07/2017  ? TOTAL KNEE ARTHROPLASTY Left 12/07/2017  ? Procedure: LEFT TOTAL KNEE ARTHROPLASTY;  Surgeon: Newt Minion, MD;  Location: [place];  Service: Orthopedics;  Laterality: Left;  ? TRACHEOSTOMY TUBE PLACEMENT N/A 09/20/2017  ? Procedure: AWAKE INTUBATION WITH ANESTHESIA WITH VIDEO ASSISTANCE;  Surgeon: Helayne Seminole, MD;  Location: [place];  Service: ENT;  Laterality: N/A;  ? TRANSTHORACIC ECHOCARDIOGRAM  08/2014  ? Normal LV size and function.  Mild LVH.  EF 55-60%.  Normal regional wall motion.  GR 1 DD.  Normal RV size and function .  ? TRANSTHORACIC ECHOCARDIOGRAM  08/12/2020  ?  [facility]): Normal LV size and function.  EF 55 to 60%.  No RWM A.  Normal filling pattern.  Normal RV.  No significant valvular disease-stenosis or regurgitation.  No vegetation.  ? TUBAL LIGATION    ? with reversal in 1994  ? UPPER GASTROINTESTINAL ENDOSCOPY    ? VENTRAL HERNIA REPAIR    ? ? ?Social History  ? ?Socioeconomic History  ? Marital status: Married  ?  Spouse name: HIILEI GERST  ? Number of children: 2  ? Years of education: 42  ? Highest education level: Not on file  ?Occupational History  ? Occupation: DISABLED  ?Tobacco Use  ? Smoking status: Never  ? Smokeless tobacco: Never  ?Vaping Use  ? Vaping Use: Never used  ?Substance and Sexual Activity  ? Alcohol use: No  ?  Alcohol/week: 0.0 standard drinks  ? Drug use: No   ? Sexual activity: Yes  ?  Birth control/protection: Surgical  ?Other Topics Concern  ? Not on file  ?Social History Narrative  ? Married 1994. 2 sons who both live close and 1 grandson.   ?   ? Disability d

## 2022-02-02 NOTE — Patient Instructions (Addendum)
Please continue the same Trulicity, metformin, and insulins.   ?check your blood sugar 4 times a day: before the 3 meals, and at bedtime.  also check if you have symptoms of your blood sugar being too high or too low.  please keep a record of the readings and bring it to your next appointment here (or you can bring the meter itself).  You can write it on any piece of paper.  please call us sooner if your blood sugar goes below 70, or if you have a lot of readings over 200.   ?You should have an endocrinology follow-up appointment in 3 months.   ? ? ?

## 2022-02-03 ENCOUNTER — Ambulatory Visit: Payer: BC Managed Care – PPO | Admitting: Endocrinology

## 2022-02-08 ENCOUNTER — Ambulatory Visit (INDEPENDENT_AMBULATORY_CARE_PROVIDER_SITE_OTHER): Payer: BC Managed Care – PPO | Admitting: Family Medicine

## 2022-02-08 ENCOUNTER — Encounter: Payer: Self-pay | Admitting: Family Medicine

## 2022-02-08 VITALS — BP 154/80 | HR 94 | Temp 98.1°F | Ht 66.0 in | Wt 221.4 lb

## 2022-02-08 DIAGNOSIS — E1159 Type 2 diabetes mellitus with other circulatory complications: Secondary | ICD-10-CM | POA: Diagnosis not present

## 2022-02-08 DIAGNOSIS — N390 Urinary tract infection, site not specified: Secondary | ICD-10-CM | POA: Diagnosis not present

## 2022-02-08 DIAGNOSIS — I152 Hypertension secondary to endocrine disorders: Secondary | ICD-10-CM

## 2022-02-08 LAB — POCT URINALYSIS DIPSTICK
Bilirubin, UA: NEGATIVE
Blood, UA: NEGATIVE
Glucose, UA: NEGATIVE
Ketones, UA: NEGATIVE
Nitrite, UA: NEGATIVE
Protein, UA: POSITIVE — AB
Spec Grav, UA: 1.025 (ref 1.010–1.025)
Urobilinogen, UA: 0.2 E.U./dL
pH, UA: 5 (ref 5.0–8.0)

## 2022-02-08 MED ORDER — CEPHALEXIN 500 MG PO CAPS: 500.0000 mg | ORAL_CAPSULE | Freq: Two times a day (BID) | ORAL | 0 refills | Status: DC

## 2022-02-08 NOTE — Progress Notes (Signed)
? ?  [name] is a 61 y.o. female who presents today for an office visit. ? ?Assessment/Plan:  ?New/Acute Problems: ?UTI ?Symptoms and UA consistent with UTI.  No signs of systemic illness. Will empirically start Keflex '500mg'$  bid.  She has tolerated Keflex well in the past.  Encouraged good hydration.  Urine culture is pending.  She will let us know if not improving. ? ?Chronic Problems Addressed Today: ?HTN -elevated today to 154/80 on recheck.  She is normally well controlled.  We will continue her current regimen of Coreg 12.5 mg daily, telmisartan 20 mg daily, and Cardizem 120 mg daily.  She will continue to monitor at home and let us know if persistently elevated. ? ?  ?Subjective:  ?HPI: ? ?Patient here with concern for UTI.  She has more frequency and lower abdominal pressure.  Feels like prior UTIs.  She was diagnosed with UTI about a month ago and treated with a course of Augmentin.  Symptoms improved for several weeks before worsening a few days ago.  No fevers or chills. ? ?   ?  ?Objective:  ?Physical Exam: ?BP (!) 154/80 (BP Location: Right Arm)   Pulse 94   Temp 98.1 ?F (36.7 ?C) (Temporal)   Ht '5\' 6"'$  (1.676 m)   Wt 221 lb 6.4 oz (100.4 kg)   SpO2 98%   BMI 35.73 kg/m?   ?Gen: No acute distress, resting comfortably ?CV: Regular rate and rhythm with no murmurs appreciated ?Pulm: Normal work of breathing, clear to auscultation bilaterally with no crackles, wheezes, or rhonchi ?Neuro: Grossly normal, moves all extremities ?Psych: Normal affect and thought content ? ?   ? ?Algis Greenhouse. Jerline Pain, MD ?02/08/2022 11:48 AM  ?

## 2022-02-08 NOTE — Patient Instructions (Signed)
It was very nice to see you today! ? ?Please start the antibiotic.  Make sure that you are getting plenty of fluids.  We will call you once we have results back on your urine culture. ? ?Please keep an eye on your blood pressure and let us know if it is persistently elevated to 140/90 or higher. ? ?Take care, ?Dr Jerline Pain ? ?PLEASE NOTE: ? ?If you had any lab tests please let us know if you have not heard back within a few days. You may see your results on mychart before we have a chance to review them but we will give you a call once they are reviewed by [place]. If we ordered any referrals today, please let us know if you have not heard from their office within the next week.  ? ?Please try these tips to maintain a healthy lifestyle: ? ?Eat at least 3 REAL meals and 1-2 snacks per day.  Aim for no more than 5 hours between eating.  If you eat breakfast, please do so within one hour of getting up.  ? ?Each meal should contain half fruits/vegetables, one quarter protein, and one quarter carbs (no bigger than a computer mouse) ? ?Cut down on sweet beverages. This includes juice, soda, and sweet tea.  ? ?Drink at least 1 glass of water with each meal and aim for at least 8 glasses per day ? ?Exercise at least 150 minutes every week.   ?

## 2022-02-09 DIAGNOSIS — R29898 Other symptoms and signs involving the musculoskeletal system: Secondary | ICD-10-CM | POA: Diagnosis not present

## 2022-02-09 DIAGNOSIS — Z789 Other specified health status: Secondary | ICD-10-CM | POA: Diagnosis not present

## 2022-02-09 DIAGNOSIS — M25511 Pain in right shoulder: Secondary | ICD-10-CM | POA: Diagnosis not present

## 2022-02-09 DIAGNOSIS — M7551 Bursitis of right shoulder: Secondary | ICD-10-CM | POA: Diagnosis not present

## 2022-02-10 LAB — URINE CULTURE
MICRO NUMBER:: 13333197
SPECIMEN QUALITY:: ADEQUATE

## 2022-02-11 ENCOUNTER — Other Ambulatory Visit: Payer: Self-pay

## 2022-02-11 DIAGNOSIS — Z789 Other specified health status: Secondary | ICD-10-CM | POA: Diagnosis not present

## 2022-02-11 DIAGNOSIS — M75101 Unspecified rotator cuff tear or rupture of right shoulder, not specified as traumatic: Secondary | ICD-10-CM | POA: Diagnosis not present

## 2022-02-11 DIAGNOSIS — R29898 Other symptoms and signs involving the musculoskeletal system: Secondary | ICD-10-CM | POA: Diagnosis not present

## 2022-02-11 DIAGNOSIS — M25511 Pain in right shoulder: Secondary | ICD-10-CM | POA: Diagnosis not present

## 2022-02-11 DIAGNOSIS — M7551 Bursitis of right shoulder: Secondary | ICD-10-CM | POA: Diagnosis not present

## 2022-02-11 MED ORDER — NITROFURANTOIN MONOHYD MACRO 100 MG PO CAPS: 100.0000 mg | ORAL_CAPSULE | Freq: Two times a day (BID) | ORAL | 0 refills | Status: DC

## 2022-02-11 NOTE — Progress Notes (Signed)
Please inform patient of the following: ? ?Her urine culture confirms UTI.  Unfortunately the antibiotic we have her on is not effective against the bacteria that she has.  Please send in Macrobid 100 mg twice daily x5 days.  She should let us know if not improving after this.

## 2022-02-12 ENCOUNTER — Inpatient Hospital Stay: Payer: BC Managed Care – PPO | Attending: Hematology and Oncology

## 2022-02-12 ENCOUNTER — Encounter: Payer: Self-pay | Admitting: Hematology and Oncology

## 2022-02-12 ENCOUNTER — Inpatient Hospital Stay (HOSPITAL_BASED_OUTPATIENT_CLINIC_OR_DEPARTMENT_OTHER): Payer: BC Managed Care – PPO | Admitting: Hematology and Oncology

## 2022-02-12 ENCOUNTER — Other Ambulatory Visit: Payer: Self-pay

## 2022-02-12 ENCOUNTER — Telehealth: Payer: Self-pay

## 2022-02-12 ENCOUNTER — Inpatient Hospital Stay: Payer: Medicare Other

## 2022-02-12 DIAGNOSIS — E1165 Type 2 diabetes mellitus with hyperglycemia: Secondary | ICD-10-CM | POA: Insufficient documentation

## 2022-02-12 DIAGNOSIS — N183 Chronic kidney disease, stage 3 unspecified: Secondary | ICD-10-CM | POA: Diagnosis not present

## 2022-02-12 DIAGNOSIS — D508 Other iron deficiency anemias: Secondary | ICD-10-CM

## 2022-02-12 DIAGNOSIS — D539 Nutritional anemia, unspecified: Secondary | ICD-10-CM

## 2022-02-12 DIAGNOSIS — Z79899 Other long term (current) drug therapy: Secondary | ICD-10-CM | POA: Diagnosis not present

## 2022-02-12 LAB — CBC WITH DIFFERENTIAL/PLATELET
Abs Immature Granulocytes: 0.19 10*3/uL — ABNORMAL HIGH (ref 0.00–0.07)
Basophils Absolute: 0.1 10*3/uL (ref 0.0–0.1)
Basophils Relative: 1 %
Eosinophils Absolute: 0.4 10*3/uL (ref 0.0–0.5)
Eosinophils Relative: 4 %
HCT: 33.8 % — ABNORMAL LOW (ref 36.0–46.0)
Hemoglobin: 11.2 g/dL — ABNORMAL LOW (ref 12.0–15.0)
Immature Granulocytes: 2 %
Lymphocytes Relative: 19 %
Lymphs Abs: 2.1 10*3/uL (ref 0.7–4.0)
MCH: 30.7 pg (ref 26.0–34.0)
MCHC: 33.1 g/dL (ref 30.0–36.0)
MCV: 92.6 fL (ref 80.0–100.0)
Monocytes Absolute: 0.6 10*3/uL (ref 0.1–1.0)
Monocytes Relative: 6 %
Neutro Abs: 7.9 10*3/uL — ABNORMAL HIGH (ref 1.7–7.7)
Neutrophils Relative %: 68 %
Platelets: 324 10*3/uL (ref 150–400)
RBC: 3.65 MIL/uL — ABNORMAL LOW (ref 3.87–5.11)
RDW: 13 % (ref 11.5–15.5)
WBC: 11.3 10*3/uL — ABNORMAL HIGH (ref 4.0–10.5)
nRBC: 0 % (ref 0.0–0.2)

## 2022-02-12 LAB — COMPREHENSIVE METABOLIC PANEL
ALT: 19 U/L (ref 0–44)
AST: 18 U/L (ref 15–41)
Albumin: 4.4 g/dL (ref 3.5–5.0)
Alkaline Phosphatase: 106 U/L (ref 38–126)
Anion gap: 10 (ref 5–15)
BUN: 23 mg/dL (ref 8–23)
CO2: 25 mmol/L (ref 22–32)
Calcium: 9.4 mg/dL (ref 8.9–10.3)
Chloride: 100 mmol/L (ref 98–111)
Creatinine, Ser: 1.09 mg/dL — ABNORMAL HIGH (ref 0.44–1.00)
GFR, Estimated: 57 mL/min — ABNORMAL LOW (ref >60)
Glucose, Bld: 296 mg/dL — ABNORMAL HIGH (ref 70–99)
Potassium: 4.1 mmol/L (ref 3.5–5.1)
Sodium: 135 mmol/L (ref 135–145)
Total Bilirubin: 0.6 mg/dL (ref 0.3–1.2)
Total Protein: 7.4 g/dL (ref 6.5–8.1)

## 2022-02-12 LAB — IRON AND IRON BINDING CAPACITY (CC-WL,HP ONLY)
Iron: 70 ug/dL (ref 28–170)
Saturation Ratios: 17 % (ref 10.4–31.8)
TIBC: 419 ug/dL (ref 250–450)
UIBC: 349 ug/dL (ref 148–442)

## 2022-02-12 LAB — FERRITIN: Ferritin: 223 ng/mL (ref 11–307)

## 2022-02-12 NOTE — Assessment & Plan Note (Signed)
She has intermittent chronic kidney disease secondary to cardiac issues and diabetes ?She will continue aggressive risk factor modification ?There could be a component of anemia chronic kidney disease but with her current level of hemoglobin, she does not need ESA ?

## 2022-02-12 NOTE — Progress Notes (Signed)
? ?[name] ?OFFICE PROGRESS NOTE ? ?[name], MD ? ?ASSESSMENT & PLAN:  ?Deficiency anemia ?I have reviewed blood work with the patient  ?Overall, she has multifactorial anemia ?Previously, she was found to have combined iron and B12 deficiency ?She is receiving B12 supplements through her primary care doctor's office ?Repeat iron studies show adequate level of ferritin ?I will cancel her IV iron today ?I have a long time explaining to the patient that the cause of her fatigue and anemia is not related to nutritional deficiency, likely due to uncontrolled diabetes causing chronic kidney disease ?She has not needed intravenous iron for very long time ?She is comfortable to just follow with primary care doctor and endocrinologist for follow-up ?I welcomed the patient and her husband to contact me from time to time with repeat results of her iron studies ?If she needs intravenous iron infusion, I will bring her back for further treatment ? ?Uncontrolled type 2 diabetes mellitus with hyperglycemia ([place]) ?She has poorly controlled diabetes ?Her blood sugar is over 300 today ?I recommend the patient to consult with her endocrinologist for further management ? ?CKD (chronic kidney disease) stage 3, GFR 30-59 ml/min (HCC) ?She has intermittent chronic kidney disease secondary to cardiac issues and diabetes ?She will continue aggressive risk factor modification ?There could be a component of anemia chronic kidney disease but with her current level of hemoglobin, she does not need ESA ? ?No orders of the defined types were placed in this encounter. ? ? ?The total time spent in the appointment was 20 minutes encounter with patients including review of chart and various tests results, discussions about plan of care and coordination of care plan ? ? All questions were answered. The patient knows to call the clinic with any problems, questions or concerns. No barriers to learning was detected. ? ? ? Heath Lark, MD ?5/5/20233:35 PM ? ?INTERVAL HISTORY: ?[name] 61 y.o. female returns for follow-up with her husband for chronic deficiency anemia ?She complained of excessive fatigue ?The patient denies any recent signs or symptoms of bleeding such as spontaneous epistaxis, hematuria or hematochezia. ?She was treated with antibiotics recently for UTI ? ?SUMMARY OF HEMATOLOGIC HISTORY: ?She is here accompanied by her husband ?I have the opportunity to review her blood test results from 2015 ?The patient has mild anemia over the last few years ?She has multiple chronic disease such as sarcoidosis, diabetes, congestive heart failure and others.   ?She has history of fundoplication.  She has multiple EGDs in the past, the last was in 2019 which showed no evidence of stomach ulcer or gastritis ?She takes chronic proton pump inhibitor ?Her hemoglobin stable between 10-11, most consistent with anemia of chronic disease ? ?Around Halloween this year, she fell requiring left leg surgery.  Unfortunately, she developed complications with infection requiring IV antibiotics ?She also require blood transfusion intermittently ?According to her husband, advanced home care service has been drawing her blood from her PICC line and noted hemoglobin between 7.2-7.7 ?She has excessive fatigue ?This past Sunday, she had postural dizziness leading to a fall but she denies significant injuries ?She denies recent chest pain on exertion but has been somewhat sedentary since her surgery ?She was on chronic prednisone therapy but that was discontinued in October.  She use inhalers. She denies shortness of breath on minimal exertions or palpitations. ?She had not noticed any recent bleeding such as epistaxis, hematuria or hematochezia ?The patient denies over the counter NSAID  ingestion. She is on antiplatelets agents and Lovenox since surgery ? ?She had no prior history or diagnosis of cancer. Her age appropriate screening programs are  up-to-date. ?She denies any pica and eats a variety of diet. ?She never donated blood  ?The patient was prescribed oral iron supplements and she takes 2 times a day and she tolerated that poorly.  She has significant nausea ?She received 2 doses of IV iron sucrose in January 2022 ?Starting in March, 2022, she started to receive B12 injections ? ?I have reviewed the past medical history, past surgical history, social history and family history with the patient and they are unchanged from previous note. ? ?ALLERGIES:  is allergic to methotrexate, vancomycin, lisinopril, chlorhexidine, clindamycin/lincomycin, doxycycline, and teflaro [ceftaroline]. ? ?MEDICATIONS:  ?Current Outpatient Medications  ?Medication Sig Dispense Refill  ? albuterol (PROVENTIL HFA;VENTOLIN HFA) 108 (90 Base) MCG/ACT inhaler Inhale 1-2 puffs into the lungs every 6 (six) hours as needed for wheezing or shortness of breath. 8 g 5  ? allopurinol (ZYLOPRIM) 300 MG tablet TAKE 1 TABLET BY MOUTH EVERY DAY 90 tablet 0  ? aspirin EC 81 MG tablet Take 1 tablet (81 mg total) by mouth daily. 30 tablet 11  ? atorvastatin (LIPITOR) 40 MG tablet TAKE 1 TABLET BY MOUTH EVERY DAY 90 tablet 3  ? benzonatate (TESSALON PERLES) 100 MG capsule Take 1 capsule (100 mg total) by mouth 3 (three) times daily as needed for cough. 20 capsule 0  ? BREO ELLIPTA 200-25 MCG/INH AEPB TAKE 1 PUFF BY MOUTH EVERY DAY 60 each 5  ? buPROPion (WELLBUTRIN XL) 300 MG 24 hr tablet TAKE 1 TABLET BY MOUTH EVERY DAY 90 tablet 3  ? carvedilol (COREG) 12.5 MG tablet Take 12.5 mg by mouth 2 (two) times daily with a meal.    ? Cholecalciferol 25 MCG (1000 UT) capsule Take 1,000 Units by mouth in the morning and at bedtime.    ? diclofenac Sodium (VOLTAREN) 1 % GEL Apply 1 application topically as needed (pain.).     ? diltiazem (CARDIZEM CD) 120 MG 24 hr capsule TAKE 1 CAPSULE BY MOUTH EVERY DAY 90 capsule 3  ? Dulaglutide (TRULICITY) 4.5 RS/8.5IO SOPN Inject 4.5 mg as directed once a week. 6  mL 3  ? famotidine (PEPCID) 20 MG tablet Take 1 tablet (20 mg total) by mouth at bedtime. 30 tablet 3  ? fenofibrate 160 MG tablet TAKE 1 TABLET BY MOUTH EVERY DAY 90 tablet 3  ? fluticasone (FLONASE) 50 MCG/ACT nasal spray Place 2 sprays into both nostrils daily as needed for allergies. 48 mL 3  ? furosemide (LASIX) 40 MG tablet TAKE 1 TABLET BY MOUTH TWICE A DAY 180 tablet 1  ? gabapentin (NEURONTIN) 100 MG capsule TAKE 1 CAPSULE BY MOUTH THREE TIMES A DAY 270 capsule 3  ? glucose blood (CONTOUR NEXT TEST) test strip 1 each by Other route 4 (four) times daily. And lancets 4/day 400 each 3  ? insulin aspart (NOVOLOG FLEXPEN) 100 UNIT/ML FlexPen Inject 15 Units into the skin daily with supper. 15 mL 11  ? insulin glargine (LANTUS SOLOSTAR) 100 UNIT/ML Solostar Pen Inject 16 Units into the skin every morning. 30 mL 2  ? Insulin Pen Needle (PEN NEEDLES) 32G X 4 MM MISC 1 each by Does not apply route 4 (four) times daily. E11.9 400 each 0  ? ipratropium (ATROVENT) 0.03 % nasal spray PLACE 2 SPRAYS INTO BOTH NOSTRILS EVERY 12 (TWELVE) HOURS. 30 mL 2  ? KLOR-CON  M20 20 MEQ tablet TAKE 1 & 1/2 TABLETS BY MOUTH TWICE A DAY 270 tablet 2  ? lactulose (CHRONULAC) 10 GM/15ML solution TAKE 15 MLS BY MOUTH DAILY AS NEEDED FOR CONSTIPATION. 1419 mL 1  ? Lancet Devices (EASY MINI EJECT LANCING DEVICE) MISC Use as directed 4 times daily to test blood sugar 1 each 3  ? LINZESS 290 MCG CAPS capsule TAKE 1 CAPSULE BY MOUTH DAILY BEFORE BREAKFAST. 30 capsule 3  ? LORazepam (ATIVAN) 0.5 MG tablet Take 0.5 mg by mouth every 12 (twelve) hours as needed for anxiety.    ? meloxicam (MOBIC) 15 MG tablet Take 1 tablet (15 mg total) by mouth daily. 30 tablet 1  ? metFORMIN (GLUCOPHAGE-XR) 500 MG 24 hr tablet Take 4 tablets (2,000 mg total) by mouth daily with breakfast. 360 tablet 3  ? metoCLOPramide (REGLAN) 10 MG tablet Take 1 tablet (10 mg total) by mouth every 6 (six) hours. 30 tablet 0  ? Microlet Lancets MISC 1 each by Does not apply  route 2 (two) times daily. E11.9 100 each 2  ? nitrofurantoin, macrocrystal-monohydrate, (MACROBID) 100 MG capsule Take 1 capsule (100 mg total) by mouth 2 (two) times daily. 10 capsule 0  ? nitroGLYCER

## 2022-02-12 NOTE — Telephone Encounter (Signed)
-----   Message from Heath Lark, MD sent at 02/12/2022  2:53 PM EDT ----- ?Pls call her ?Ferritin is over 200 ?No need IV iron ? ?

## 2022-02-12 NOTE — Telephone Encounter (Signed)
Called and left below message. Ask her to call the office for questions. ?

## 2022-02-12 NOTE — Assessment & Plan Note (Signed)
I have reviewed blood work with the patient  ?Overall, she has multifactorial anemia ?Previously, she was found to have combined iron and B12 deficiency ?She is receiving B12 supplements through her primary care doctor's office ?Repeat iron studies show adequate level of ferritin ?I will cancel her IV iron today ?I have a long time explaining to the patient that the cause of her fatigue and anemia is not related to nutritional deficiency, likely due to uncontrolled diabetes causing chronic kidney disease ?She has not needed intravenous iron for very long time ?She is comfortable to just follow with primary care doctor and endocrinologist for follow-up ?I welcomed the patient and her husband to contact me from time to time with repeat results of her iron studies ?If she needs intravenous iron infusion, I will bring her back for further treatment ?

## 2022-02-12 NOTE — Assessment & Plan Note (Signed)
She has poorly controlled diabetes ?Her blood sugar is over 300 today ?I recommend the patient to consult with her endocrinologist for further management ?

## 2022-02-15 ENCOUNTER — Ambulatory Visit (INDEPENDENT_AMBULATORY_CARE_PROVIDER_SITE_OTHER): Payer: BC Managed Care – PPO

## 2022-02-15 VITALS — BP 138/62 | HR 91 | Temp 98.1°F | Wt 220.8 lb

## 2022-02-15 DIAGNOSIS — Z0000 Encounter for general adult medical examination without abnormal findings: Secondary | ICD-10-CM

## 2022-02-15 NOTE — Progress Notes (Addendum)
? ?Subjective:  ? [name] is a 61 y.o. female who presents for Medicare Annual (Subsequent) preventive examination. ? ?Review of Systems    ? ?Cardiac Risk Factors include: advanced age (>62mn, >>37women);diabetes mellitus;dyslipidemia;hypertension;obesity (BMI >30kg/m2) ? ?   ?Objective:  ?  ?Today's Vitals  ? 02/15/22 0983305/08/23 0935  ?BP: 140/68 138/62  ?Pulse: 99 91  ?Temp: 98.1 ?F (36.7 ?C)   ?SpO2: 93% 97%  ?Weight: 220 lb 12.8 oz (100.2 kg)   ? ?Body mass index is 35.64 kg/m?. ? ? ?  02/15/2022  ?  9:29 AM 11/17/2021  ?  8:13 PM 06/01/2021  ? 11:18 AM 02/09/2021  ?  9:45 AM 01/06/2021  ?  7:09 AM 11/12/2020  ?  8:57 PM 02/11/2020  ?  2:13 PM  ?Advanced Directives  ?Does Patient Have a Medical Advance Directive? Yes No Yes Yes No No Yes  ?Type of [place]  ?Does patient want to make changes to medical advance directive?   No - Patient declined      ?[place]? No - copy requested   No - copy requested     ?Would patient like information on creating a medical advance directive?  No - Patient declined No - Patient declined  No - Patient declined No - Patient declined   ? ? ?Current Medications (verified) ?Outpatient Encounter Medications as of 02/15/2022  ?Medication Sig  ? albuterol (PROVENTIL HFA;VENTOLIN HFA) 108 (90 Base) MCG/ACT inhaler Inhale 1-2 puffs into the lungs every 6 (six) hours as needed for wheezing or shortness of breath.  ? allopurinol (ZYLOPRIM) 300 MG tablet TAKE 1 TABLET BY MOUTH EVERY DAY  ? aspirin EC 81 MG tablet Take 1 tablet (81 mg total) by mouth daily.  ? atorvastatin (LIPITOR) 40 MG tablet TAKE 1 TABLET BY MOUTH EVERY DAY  ? BREO ELLIPTA 200-25 MCG/INH AEPB TAKE 1 PUFF BY MOUTH EVERY DAY  ? buPROPion (WELLBUTRIN XL) 300 MG 24 hr tablet TAKE 1 TABLET BY MOUTH EVERY DAY  ? carvedilol (COREG) 12.5 MG  tablet Take 12.5 mg by mouth 2 (two) times daily with a meal.  ? Cholecalciferol 25 MCG (1000 UT) capsule Take 1,000 Units by mouth in the morning and at bedtime.  ? diclofenac Sodium (VOLTAREN) 1 % GEL Apply 1 application topically as needed (pain.).   ? diltiazem (CARDIZEM CD) 120 MG 24 hr capsule TAKE 1 CAPSULE BY MOUTH EVERY DAY  ? Dulaglutide (TRULICITY) 4.5 MAS/5.0NLSOPN Inject 4.5 mg as directed once a week.  ? famotidine (PEPCID) 20 MG tablet Take 1 tablet (20 mg total) by mouth at bedtime.  ? fenofibrate 160 MG tablet TAKE 1 TABLET BY MOUTH EVERY DAY  ? fluticasone (FLONASE) 50 MCG/ACT nasal spray Place 2 sprays into both nostrils daily as needed for allergies.  ? furosemide (LASIX) 40 MG tablet TAKE 1 TABLET BY MOUTH TWICE A DAY  ? gabapentin (NEURONTIN) 100 MG capsule TAKE 1 CAPSULE BY MOUTH THREE TIMES A DAY  ? glucose blood (CONTOUR NEXT TEST) test strip 1 each by Other route 4 (four) times daily. And lancets 4/day  ? insulin aspart (NOVOLOG FLEXPEN) 100 UNIT/ML FlexPen Inject 15 Units into the skin daily with supper.  ? insulin glargine (LANTUS SOLOSTAR) 100 UNIT/ML Solostar Pen Inject 16 Units into the skin every morning.  ? Insulin  Pen Needle (PEN NEEDLES) 32G X 4 MM MISC 1 each by Does not apply route 4 (four) times daily. E11.9  ? ipratropium (ATROVENT) 0.03 % nasal spray PLACE 2 SPRAYS INTO BOTH NOSTRILS EVERY 12 (TWELVE) HOURS.  ? KLOR-CON M20 20 MEQ tablet TAKE 1 & 1/2 TABLETS BY MOUTH TWICE A DAY  ? lactulose (CHRONULAC) 10 GM/15ML solution TAKE 15 MLS BY MOUTH DAILY AS NEEDED FOR CONSTIPATION.  ? Lancet Devices (EASY MINI EJECT LANCING DEVICE) MISC Use as directed 4 times daily to test blood sugar  ? LINZESS 290 MCG CAPS capsule TAKE 1 CAPSULE BY MOUTH DAILY BEFORE BREAKFAST.  ? LORazepam (ATIVAN) 0.5 MG tablet Take 0.5 mg by mouth every 12 (twelve) hours as needed for anxiety.  ? meloxicam (MOBIC) 15 MG tablet Take 1 tablet (15 mg total) by mouth daily.  ? metFORMIN (GLUCOPHAGE-XR) 500 MG 24  hr tablet Take 4 tablets (2,000 mg total) by mouth daily with breakfast.  ? metoCLOPramide (REGLAN) 10 MG tablet Take 1 tablet (10 mg total) by mouth every 6 (six) hours.  ? Microlet Lancets MISC 1 each by Does not apply route 2 (two) times daily. E11.9  ? nitrofurantoin, macrocrystal-monohydrate, (MACROBID) 100 MG capsule Take 1 capsule (100 mg total) by mouth 2 (two) times daily.  ? nitroGLYCERIN (NITROSTAT) 0.4 MG SL tablet Place 1 tablet (0.4 mg total) under the tongue every 5 (five) minutes as needed for chest pain. Reported on 01/05/2016  ? nystatin (MYCOSTATIN) 100000 UNIT/ML suspension nystatin 100,000 unit/mL oral suspension ? SWISH, GARGLE, AND SPIT 20 ML 4 TIMES A DAY UNTIL RESOLVED  ? ondansetron (ZOFRAN-ODT) 4 MG disintegrating tablet Take 1 tablet (4 mg total) by mouth every 8 (eight) hours as needed for nausea or vomiting.  ? oxyCODONE-acetaminophen (PERCOCET/ROXICET) 5-325 MG tablet Take 1 tablet by mouth every 4 (four) hours as needed.  ? phenazopyridine (PYRIDIUM) 100 MG tablet Take 1 tablet (100 mg total) by mouth 3 (three) times daily as needed for pain.  ? RABEprazole (ACIPHEX) 20 MG tablet Take 1 tablet (20 mg total) by mouth daily.  ? telmisartan (MICARDIS) 20 MG tablet TAKE 1 TABLET BY MOUTH EVERY DAY  ? venlafaxine XR (EFFEXOR-XR) 75 MG 24 hr capsule TAKE 1 CAPSULE BY MOUTH TWICE A DAY  ? benzonatate (TESSALON PERLES) 100 MG capsule Take 1 capsule (100 mg total) by mouth 3 (three) times daily as needed for cough. (Patient not taking: Reported on 02/15/2022)  ? [DISCONTINUED] mupirocin nasal ointment (BACTROBAN) 2 % Place 1 application into the nose 2 (two) times daily. Use one-half of tube in each nostril twice daily for five (5) days. After application, press sides of nose together and gently massage.  ? ?No facility-administered encounter medications on file as of 02/15/2022.  ? ? ?Allergies (verified) ?Methotrexate, Vancomycin, Lisinopril, Chlorhexidine, Clindamycin/lincomycin, Doxycycline,  and Teflaro [ceftaroline]  ? ?History: ?Past Medical History:  ?Diagnosis Date  ? Abnormal SPEP 04/17/2014  ? Acute left ankle pain 01/26/2017  ? ANEMIA-UNSPECIFIED 09/18/2009  ? Anxiety   ? Arthritis   ? Blood transfusion without reported diagnosis   ? Cataract   ? CHF (congestive heart failure) ([place])   ? Chronic diastolic heart failure, NYHA class 2 ([place])   ? Normal LVEDP by May 2018  ? COPD (chronic obstructive pulmonary disease) ([place])   ? Depression   ? DIABETES MELLITUS, TYPE II 08/21/2006  ? Diabetic osteomyelitis ([place]) 05/29/2015  ? Fatty liver   ? Fracture of 5th metatarsal   ? non  union  ? GERD 08/21/2006  ? Had fundoplication  ? GOUT 08/20/2010  ? Hammer toe of right foot   ? 2-5th toes  ? Hx of umbilical hernia repair   ? HYPERLIPIDEMIA 08/21/2006  ? HYPERTENSION 08/21/2006  ? Infection of wound due to methicillin resistant Staphylococcus aureus (MRSA)   ? Internal hemorrhoids   ? Kidney problem   ? Multiple allergies 10/14/2016  ? OBESITY 06/04/2009  ? Onychomycosis 10/27/2015  ? Osteomyelitis of left foot ([place]) 05/29/2015  ? Osteoporosis   ? Pulmonary sarcoidosis ([place])   ? Followed locally by pulmonology, but also by [facility] Casper Harrison at [facility]  ? Right knee pain 01/26/2017  ? Vocal cord dysfunction   ? Wears partial dentures   ? ?Past Surgical History:  ?Procedure Laterality Date  ? ABDOMINAL HYSTERECTOMY    ? APPENDECTOMY    ? BLADDER SUSPENSION  11/11/2011  ? Procedure: TRANSVAGINAL TAPE (TVT) PROCEDURE;  Surgeon: Olga Millers, MD;  Location: [facility];  Service: Gynecology;  Laterality: N/A;  ? CAROTIDS  02/18/2011  ? CAROTID DUPLEX; VERTEBRALS ARE PATENT WITH ANTEGRADE FLOW. ICA/CCA RATIO 1.61 ON RIGHT AND 0.75 ON LEFT  ? CATARACT EXTRACTION, BILATERAL    ? CHOLECYSTECTOMY  1984  ? COLONOSCOPY  04/29/2010  ? [name]  ? CYSTOSCOPY  11/11/2011  ? Procedure: CYSTOSCOPY;  Surgeon: Olga Millers, MD;  Location: [facility];  Service: Gynecology;  Laterality: N/A;  ? EXTUBATION (ENDOTRACHEAL) IN OR N/A  09/23/2017  ? Procedure: EXTUBATION (ENDOTRACHEAL) IN OR;  Surgeon: Helayne Seminole, MD;  Location: [facility];  Service: ENT;  Laterality: N/A;  ? FIBEROPTIC LARYNGOSCOPY AND TRACHEOSCOPY N/A 09/23/2017  ? Pro

## 2022-02-15 NOTE — Patient Instructions (Signed)
[name] , ?Thank you for taking time to come for your Medicare Wellness Visit. I appreciate your ongoing commitment to your health goals. Please review the following plan we discussed and let me know if I can assist you in the future.  ? ?Screening recommendations/referrals: ?Colonoscopy: Done 01/06/21 repeat every 7 years  ?Mammogram: Done 07/08/21 repeat every year  ?Bone Density: Done 11/01/19 repeat every 2 years  ?Recommended yearly ophthalmology/optometry visit for glaucoma screening and checkup ?Recommended yearly dental visit for hygiene and checkup ? ?Vaccinations: ?Influenza vaccine: Done 07/07/21 repeat every  ?Pneumococcal vaccine: Up to date ?Tdap vaccine: Done 07/02/20 repeat every 10 years  ?Shingles vaccine: Completed  5/23 & 05/02/17   ?Covid-19: Completed 3/17, 01/16/20 & 7/6, 07/08/21 ? ?Advanced directives: Please bring a copy of your health care power of attorney and living will to the office at your convenience. ? ?Conditions/risks identified: Lose weight  ? ?Next appointment: Follow up in one year for your annual wellness visit.  ? ?Preventive Care 40-64 Years, Female ?Preventive care refers to lifestyle choices and visits with your health care provider that can promote health and wellness. ?What does preventive care include? ?A yearly physical exam. This is also called an annual well check. ?Dental exams once or twice a year. ?Routine eye exams. Ask your health care provider how often you should have your eyes checked. ?Personal lifestyle choices, including: ?Daily care of your teeth and gums. ?Regular physical activity. ?Eating a healthy diet. ?Avoiding tobacco and drug use. ?Limiting alcohol use. ?Practicing safe sex. ?Taking low-dose aspirin daily starting at age 80. ?Taking vitamin and mineral supplements as recommended by your health care provider. ?What happens during an annual well check? ?The services and screenings done by your health care provider during your annual well check will  depend on your age, overall health, lifestyle risk factors, and family history of disease. ?Counseling  ?Your health care provider may ask you questions about your: ?Alcohol use. ?Tobacco use. ?Drug use. ?Emotional well-being. ?Home and relationship well-being. ?Sexual activity. ?Eating habits. ?Work and work Statistician. ?Method of birth control. ?Menstrual cycle. ?Pregnancy history. ?Screening  ?You may have the following tests or measurements: ?Height, weight, and BMI. ?Blood pressure. ?Lipid and cholesterol levels. These may be checked every 5 years, or more frequently if you are over 71 years old. ?Skin check. ?Lung cancer screening. You may have this screening every year starting at age 40 if you have a 30-pack-year history of smoking and currently smoke or have quit within the past 15 years. ?Fecal occult blood test (FOBT) of the stool. You may have this test every year starting at age 58. ?Flexible sigmoidoscopy or colonoscopy. You may have a sigmoidoscopy every 5 years or a colonoscopy every 10 years starting at age 44. ?Hepatitis C blood test. ?Hepatitis B blood test. ?Sexually transmitted disease (STD) testing. ?Diabetes screening. This is done by checking your blood sugar (glucose) after you have not eaten for a while (fasting). You may have this done every 1-3 years. ?Mammogram. This may be done every 1-2 years. Talk to your health care provider about when you should start having regular mammograms. This may depend on whether you have a family history of breast cancer. ?BRCA-related cancer screening. This may be done if you have a family history of breast, ovarian, tubal, or peritoneal cancers. ?Pelvic exam and Pap test. This may be done every 3 years starting at age 8. Starting at age 43, this may be done every 5 years if you  have a Pap test in combination with an HPV test. ?Bone density scan. This is done to screen for osteoporosis. You may have this scan if you are at high risk for  osteoporosis. ?Discuss your test results, treatment options, and if necessary, the need for more tests with your health care provider. ?Vaccines  ?Your health care provider may recommend certain vaccines, such as: ?Influenza vaccine. This is recommended every year. ?Tetanus, diphtheria, and acellular pertussis (Tdap, Td) vaccine. You may need a Td booster every 10 years. ?Zoster vaccine. You may need this after age 67. ?Pneumococcal 13-valent conjugate (PCV13) vaccine. You may need this if you have certain conditions and were not previously vaccinated. ?Pneumococcal polysaccharide (PPSV23) vaccine. You may need one or two doses if you smoke cigarettes or if you have certain conditions. ?Talk to your health care provider about which screenings and vaccines you need and how often you need them. ?This information is not intended to replace advice given to you by your health care provider. Make sure you discuss any questions you have with your health care provider. ?Document Released: 10/24/2015 Document Revised: 06/16/2016 Document Reviewed: 07/29/2015 ?Elsevier Interactive Patient Education ? 2017 [employer] ? ? ? ?Fall Prevention in the Home ?Falls can cause injuries. They can happen to people of all ages. There are many things you can do to make your home safe and to help prevent falls. ?What can I do on the outside of my home? ?Regularly fix the edges of walkways and driveways and fix any cracks. ?Remove anything that might make you trip as you walk through a door, such as a raised step or threshold. ?Trim any bushes or trees on the path to your home. ?Use bright outdoor lighting. ?Clear any walking paths of anything that might make someone trip, such as rocks or tools. ?Regularly check to see if handrails are loose or broken. Make sure that both sides of any steps have handrails. ?Any raised decks and porches should have guardrails on the edges. ?Have any leaves, snow, or ice cleared regularly. ?Use sand or  salt on walking paths during winter. ?Clean up any spills in your garage right away. This includes oil or grease spills. ?What can I do in the bathroom? ?Use night lights. ?Install grab bars by the toilet and in the tub and shower. Do not use towel bars as grab bars. ?Use non-skid mats or decals in the tub or shower. ?If you need to sit down in the shower, use a plastic, non-slip stool. ?Keep the floor dry. Clean up any water that spills on the floor as soon as it happens. ?Remove soap buildup in the tub or shower regularly. ?Attach bath mats securely with double-sided non-slip rug tape. ?Do not have throw rugs and other things on the floor that can make you trip. ?What can I do in the bedroom? ?Use night lights. ?Make sure that you have a light by your bed that is easy to reach. ?Do not use any sheets or blankets that are too big for your bed. They should not hang down onto the floor. ?Have a firm chair that has side arms. You can use this for support while you get dressed. ?Do not have throw rugs and other things on the floor that can make you trip. ?What can I do in the kitchen? ?Clean up any spills right away. ?Avoid walking on wet floors. ?Keep items that you use a lot in easy-to-reach places. ?If you need to reach something above you,  use a strong step stool that has a grab bar. ?Keep electrical cords out of the way. ?Do not use floor polish or wax that makes floors slippery. If you must use wax, use non-skid floor wax. ?Do not have throw rugs and other things on the floor that can make you trip. ?What can I do with my stairs? ?Do not leave any items on the stairs. ?Make sure that there are handrails on both sides of the stairs and use them. Fix handrails that are broken or loose. Make sure that handrails are as long as the stairways. ?Check any carpeting to make sure that it is firmly attached to the stairs. Fix any carpet that is loose or worn. ?Avoid having throw rugs at the top or bottom of the stairs. If  you do have throw rugs, attach them to the floor with carpet tape. ?Make sure that you have a light switch at the top of the stairs and the bottom of the stairs. If you do not have them, ask someone to add them for you. ?Wha

## 2022-02-16 ENCOUNTER — Encounter: Payer: BC Managed Care – PPO | Admitting: Family Medicine

## 2022-02-16 DIAGNOSIS — Z789 Other specified health status: Secondary | ICD-10-CM | POA: Diagnosis not present

## 2022-02-16 DIAGNOSIS — M25511 Pain in right shoulder: Secondary | ICD-10-CM | POA: Diagnosis not present

## 2022-02-16 DIAGNOSIS — M7551 Bursitis of right shoulder: Secondary | ICD-10-CM | POA: Diagnosis not present

## 2022-02-16 DIAGNOSIS — M75101 Unspecified rotator cuff tear or rupture of right shoulder, not specified as traumatic: Secondary | ICD-10-CM | POA: Diagnosis not present

## 2022-02-23 DIAGNOSIS — M7551 Bursitis of right shoulder: Secondary | ICD-10-CM | POA: Diagnosis not present

## 2022-02-23 DIAGNOSIS — M25511 Pain in right shoulder: Secondary | ICD-10-CM | POA: Diagnosis not present

## 2022-02-23 DIAGNOSIS — M75101 Unspecified rotator cuff tear or rupture of right shoulder, not specified as traumatic: Secondary | ICD-10-CM | POA: Diagnosis not present

## 2022-02-23 DIAGNOSIS — R29898 Other symptoms and signs involving the musculoskeletal system: Secondary | ICD-10-CM | POA: Diagnosis not present

## 2022-02-25 DIAGNOSIS — M25511 Pain in right shoulder: Secondary | ICD-10-CM | POA: Diagnosis not present

## 2022-02-25 DIAGNOSIS — R29898 Other symptoms and signs involving the musculoskeletal system: Secondary | ICD-10-CM | POA: Diagnosis not present

## 2022-02-25 DIAGNOSIS — M7551 Bursitis of right shoulder: Secondary | ICD-10-CM | POA: Diagnosis not present

## 2022-02-25 DIAGNOSIS — M75101 Unspecified rotator cuff tear or rupture of right shoulder, not specified as traumatic: Secondary | ICD-10-CM | POA: Diagnosis not present

## 2022-03-03 DIAGNOSIS — M7551 Bursitis of right shoulder: Secondary | ICD-10-CM | POA: Diagnosis not present

## 2022-03-03 DIAGNOSIS — M25511 Pain in right shoulder: Secondary | ICD-10-CM | POA: Diagnosis not present

## 2022-03-03 DIAGNOSIS — R29898 Other symptoms and signs involving the musculoskeletal system: Secondary | ICD-10-CM | POA: Diagnosis not present

## 2022-03-03 DIAGNOSIS — M75101 Unspecified rotator cuff tear or rupture of right shoulder, not specified as traumatic: Secondary | ICD-10-CM | POA: Diagnosis not present

## 2022-03-04 DIAGNOSIS — R29898 Other symptoms and signs involving the musculoskeletal system: Secondary | ICD-10-CM | POA: Diagnosis not present

## 2022-03-04 DIAGNOSIS — M75101 Unspecified rotator cuff tear or rupture of right shoulder, not specified as traumatic: Secondary | ICD-10-CM | POA: Diagnosis not present

## 2022-03-04 DIAGNOSIS — Z789 Other specified health status: Secondary | ICD-10-CM | POA: Diagnosis not present

## 2022-03-04 DIAGNOSIS — M7551 Bursitis of right shoulder: Secondary | ICD-10-CM | POA: Diagnosis not present

## 2022-03-09 DIAGNOSIS — M75101 Unspecified rotator cuff tear or rupture of right shoulder, not specified as traumatic: Secondary | ICD-10-CM | POA: Diagnosis not present

## 2022-03-09 DIAGNOSIS — R29898 Other symptoms and signs involving the musculoskeletal system: Secondary | ICD-10-CM | POA: Diagnosis not present

## 2022-03-09 DIAGNOSIS — M25511 Pain in right shoulder: Secondary | ICD-10-CM | POA: Diagnosis not present

## 2022-03-09 DIAGNOSIS — M7551 Bursitis of right shoulder: Secondary | ICD-10-CM | POA: Diagnosis not present

## 2022-03-15 ENCOUNTER — Encounter: Payer: Self-pay | Admitting: Family Medicine

## 2022-03-15 ENCOUNTER — Ambulatory Visit (INDEPENDENT_AMBULATORY_CARE_PROVIDER_SITE_OTHER): Payer: BC Managed Care – PPO | Admitting: Family Medicine

## 2022-03-15 VITALS — BP 138/78 | HR 82 | Temp 98.2°F | Ht 66.0 in | Wt 226.0 lb

## 2022-03-15 DIAGNOSIS — R490 Dysphonia: Secondary | ICD-10-CM | POA: Diagnosis not present

## 2022-03-15 MED ORDER — AMOXICILLIN 875 MG PO TABS: 875.0000 mg | ORAL_TABLET | Freq: Two times a day (BID) | ORAL | 0 refills | Status: DC

## 2022-03-15 NOTE — Progress Notes (Signed)
Subjective:     Patient ID: [name], female    DOB: [birth year], 61 y.o.   MRN: [id number]  Chief Complaint  Patient presents with   Hoarse    Hoarseness started Friday, some cough, no fever, took some cough drops, didn't help.     HPI Hoarseness and cough for 3 days.  No f/c/sob/sore throat.  Mild runny nose  There are no preventive care reminders to display for this patient.  Past Medical History:  Diagnosis Date   Abnormal SPEP 04/17/2014   Acute left ankle pain 01/26/2017   ANEMIA-UNSPECIFIED 09/18/2009   Anxiety    Arthritis    Blood transfusion without reported diagnosis    Cataract    CHF (congestive heart failure) (HCC)    Chronic diastolic heart failure, NYHA class 2 (HCC)    Normal LVEDP by May 2018   COPD (chronic obstructive pulmonary disease) ([place])    Depression    DIABETES MELLITUS, TYPE II 08/21/2006   Diabetic osteomyelitis ([place]) 05/29/2015   Fatty liver    Fracture of 5th metatarsal    non union   GERD 40/34/7425   Had fundoplication   GOUT 95/63/8756   Hammer toe of right foot    2-5th toes   Hx of umbilical hernia repair    HYPERLIPIDEMIA 08/21/2006   HYPERTENSION 08/21/2006   Infection of wound due to methicillin resistant Staphylococcus aureus (MRSA)    Internal hemorrhoids    Kidney problem    Multiple allergies 10/14/2016   OBESITY 06/04/2009   Onychomycosis 10/27/2015   Osteomyelitis of left foot ([place]) 05/29/2015   Osteoporosis    Pulmonary sarcoidosis ([place])    Followed locally by pulmonology, but also by Dr. Casper Harrison at [facility]   Right knee pain 01/26/2017   Vocal cord dysfunction    Wears partial dentures     Past Surgical History:  Procedure Laterality Date   ABDOMINAL HYSTERECTOMY     APPENDECTOMY     BLADDER SUSPENSION  11/11/2011   Procedure: TRANSVAGINAL TAPE (TVT) PROCEDURE;  Surgeon: Olga Millers, MD;  Location: [facility];  Service: Gynecology;  Laterality: N/A;   CAROTIDS  02/18/2011   CAROTID DUPLEX;  VERTEBRALS ARE PATENT WITH ANTEGRADE FLOW. ICA/CCA RATIO 1.61 ON RIGHT AND 0.75 ON LEFT   CATARACT EXTRACTION, BILATERAL     CHOLECYSTECTOMY  1984   COLONOSCOPY  04/29/2010   [name]   CYSTOSCOPY  11/11/2011   Procedure: CYSTOSCOPY;  Surgeon: Olga Millers, MD;  Location: [facility];  Service: Gynecology;  Laterality: N/A;   EXTUBATION (ENDOTRACHEAL) IN OR N/A 09/23/2017   Procedure: EXTUBATION (ENDOTRACHEAL) IN OR;  Surgeon: Helayne Seminole, MD;  Location: [facility];  Service: ENT;  Laterality: N/A;   FIBEROPTIC LARYNGOSCOPY AND TRACHEOSCOPY N/A 09/23/2017   Procedure: FLEXIBLE FIBEROPTIC LARYNGOSCOPY;  Surgeon: Helayne Seminole, MD;  Location: [facility];  Service: ENT;  Laterality: N/A;   FRACTURE SURGERY     foot   Rich Left 10/02/2018   Procedure: HALLUX ARTHRODESIS;  Surgeon: Edrick Kins, DPM;  Location: [facility];  Service: Podiatry;  Laterality: Left;   HAMMER TOE SURGERY Left 10/02/2018   Procedure: HAMMER TOE CORRECTION 2ND 3RD 4RD FIFTH TOE;  Surgeon: Edrick Kins, DPM;  Location: [facility];  Service: Podiatry;  Laterality: Left;   HAMMER TOE SURGERY Right 04/12/2019   Procedure: HAMMER TOE CORRECTION, 2ND, 3RD, 4TH AND 5TH TOES OF RIGHT FOOT;  Surgeon: Edrick Kins, DPM;  Location: [facility];  Service: Podiatry;  Laterality: Right;   HAMMER TOE SURGERY Left 10/25/2019   Procedure: HAMMER TOE REPAIR SECOND, THIRD, FOUTH;  Surgeon: Edrick Kins, DPM;  Location: [facility];  Service: Podiatry;  Laterality: Left;   HERNIA REPAIR     I & D EXTREMITY Left 06/27/2015   Procedure: Partial Excision Left Calcaneus, Place Antibiotic Beads, and Wound VAC;  Surgeon: Newt Minion, MD;  Location: [facility];  Service: Orthopedics;  Laterality: Left;   JOINT REPLACEMENT     KNEE ARTHROSCOPY     right   LEFT AND RIGHT HEART CATHETERIZATION WITH CORONARY ANGIOGRAM N/A 04/23/2013   Procedure: LEFT AND RIGHT HEART CATHETERIZATION WITH CORONARY ANGIOGRAM;  Surgeon: Leonie Man, MD;  Location: [facility]  CATH LAB;  Service: Cardiovascular;  Laterality: N/A;   LEFT HEART CATH AND CORONARY ANGIOGRAPHY N/A 03/11/2017   Procedure: Left Heart Cath and Coronary Angiography;  Surgeon: Sherren Mocha, MD;  Location: [facility]; angiographically minimal CAD in the LAD otherwise normal.;  Normal LVEDP.  FALSE POSITIVE MYOVIEW   LEFT HEART CATH AND CORONARY ANGIOGRAPHY  07/20/2010   LVEF 50-55% WITH VERY MILD GLOBAL HYPOKINESIA; ESSENTIALLY NORMAL CORONARY ARTERIES; NORMAL LV FUNCTION   METATARSAL OSTEOTOMY WITH OPEN REDUCTION INTERNAL FIXATION (ORIF) METATARSAL WITH FUSION Left 04/09/2014   Procedure: LEFT FOOT FRACTURE OPEN TREATMENT METATARSAL INCLUDES INTERNAL FIXATION EACH;  Surgeon: Lorn Junes, MD;  Location: [place];  Service: Orthopedics;  Laterality: Left;   NISSEN FUNDOPLICATION  0938   NM MYOVIEW LTD --> FALSE POSITIVE  03/10/2017   : Moderate size "stress-induced "perfusion defect at the apex as well as "ill-defined stress-induced perfusion defect in the lateral wall.  EF 55%.  INTERMEDIATE risk. -->  FALSE POSITIVE   ORIF DISTAL FEMUR FRACTURE  08/08/2020   [facility]: ORIF of left extra-articular periprosthetic distal femur fracture with synthesis of the femur plate with cortical nonlocking screws.  (Thought to be pathologic fracture due to osteoporosis) -> after a fall   ORIF FEMUR W/ PERI-IMPLANT  08/29/2020   [facility]) revision of left femur fracture ORIF plate fixation screws; culture positive for MSSA   Right and left CARDIAC CATHETERIZATION  04/23/2013   Angiographic normal coronaries; LVEDP 20 mmHg, PCWP 12-14 mmHg, RAP 12 mmHg.; Fick CO/CI 4.9/2.2   ROTATOR CUFF REPAIR Right 12/09/2021   SHOULDER ARTHROSCOPY Left 03/14/2019   Procedure: LEFT SHOULDER ARTHROSCOPY, DEBRIDEMENT, AND DECOMPRESSION;  Surgeon: Newt Minion, MD;  Location: [place];  Service: Orthopedics;  Laterality: Left;   TOTAL KNEE ARTHROPLASTY Right 06/29/2017    Procedure: RIGHT TOTAL KNEE ARTHROPLASTY;  Surgeon: Newt Minion, MD;  Location: [facility];  Service: Orthopedics;  Laterality: Right;   TOTAL KNEE ARTHROPLASTY Left 12/07/2017   TOTAL KNEE ARTHROPLASTY Left 12/07/2017   Procedure: LEFT TOTAL KNEE ARTHROPLASTY;  Surgeon: Newt Minion, MD;  Location: [place];  Service: Orthopedics;  Laterality: Left;   TRACHEOSTOMY TUBE PLACEMENT N/A 09/20/2017   Procedure: AWAKE INTUBATION WITH ANESTHESIA WITH VIDEO ASSISTANCE;  Surgeon: Helayne Seminole, MD;  Location: [facility];  Service: ENT;  Laterality: N/A;   TRANSTHORACIC ECHOCARDIOGRAM  08/2014   Normal LV size and function.  Mild LVH.  EF 55-60%.  Normal regional wall motion.  GR 1 DD.  Normal RV size and function .   TRANSTHORACIC ECHOCARDIOGRAM  08/12/2020    [facility]): Normal LV size and function.  EF 55 to 60%.  No  RWM A.  Normal filling pattern.  Normal RV.  No significant valvular disease-stenosis or regurgitation.  No vegetation.   TUBAL LIGATION     with reversal in [place]      Outpatient Medications Prior to Visit  Medication Sig Dispense Refill   albuterol (PROVENTIL HFA;VENTOLIN HFA) 108 (90 Base) MCG/ACT inhaler Inhale 1-2 puffs into the lungs every 6 (six) hours as needed for wheezing or shortness of breath. 8 g 5   allopurinol (ZYLOPRIM) 300 MG tablet TAKE 1 TABLET BY MOUTH EVERY DAY 90 tablet 0   aspirin EC 81 MG tablet Take 1 tablet (81 mg total) by mouth daily. 30 tablet 11   atorvastatin (LIPITOR) 40 MG tablet TAKE 1 TABLET BY MOUTH EVERY DAY 90 tablet 3   BREO ELLIPTA 200-25 MCG/INH AEPB TAKE 1 PUFF BY MOUTH EVERY DAY 60 each 5   buPROPion (WELLBUTRIN XL) 300 MG 24 hr tablet TAKE 1 TABLET BY MOUTH EVERY DAY 90 tablet 3   carvedilol (COREG) 12.5 MG tablet Take 12.5 mg by mouth 2 (two) times daily with a meal.     Cholecalciferol 25 MCG (1000 UT) capsule Take 1,000 Units by mouth in the morning and at bedtime.      diclofenac Sodium (VOLTAREN) 1 % GEL Apply 1 application topically as needed (pain.).      diltiazem (CARDIZEM CD) 120 MG 24 hr capsule TAKE 1 CAPSULE BY MOUTH EVERY DAY 90 capsule 3   Dulaglutide (TRULICITY) 4.5 YD/7.4JO SOPN Inject 4.5 mg as directed once a week. 6 mL 3   famotidine (PEPCID) 20 MG tablet Take 1 tablet (20 mg total) by mouth at bedtime. 30 tablet 3   fenofibrate 160 MG tablet TAKE 1 TABLET BY MOUTH EVERY DAY 90 tablet 3   fluticasone (FLONASE) 50 MCG/ACT nasal spray Place 2 sprays into both nostrils daily as needed for allergies. 48 mL 3   furosemide (LASIX) 40 MG tablet TAKE 1 TABLET BY MOUTH TWICE A DAY 180 tablet 1   gabapentin (NEURONTIN) 100 MG capsule TAKE 1 CAPSULE BY MOUTH THREE TIMES A DAY 270 capsule 3   glucose blood (CONTOUR NEXT TEST) test strip 1 each by Other route 4 (four) times daily. And lancets 4/day 400 each 3   insulin aspart (NOVOLOG FLEXPEN) 100 UNIT/ML FlexPen Inject 15 Units into the skin daily with supper. 15 mL 11   insulin glargine (LANTUS SOLOSTAR) 100 UNIT/ML Solostar Pen Inject 16 Units into the skin every morning. 30 mL 2   Insulin Pen Needle (PEN NEEDLES) 32G X 4 MM MISC 1 each by Does not apply route 4 (four) times daily. E11.9 400 each 0   ipratropium (ATROVENT) 0.03 % nasal spray PLACE 2 SPRAYS INTO BOTH NOSTRILS EVERY 12 (TWELVE) HOURS. 30 mL 2   KLOR-CON M20 20 MEQ tablet TAKE 1 & 1/2 TABLETS BY MOUTH TWICE A DAY 270 tablet 2   lactulose (CHRONULAC) 10 GM/15ML solution TAKE 15 MLS BY MOUTH DAILY AS NEEDED FOR CONSTIPATION. 1419 mL 1   Lancet Devices (EASY MINI EJECT LANCING DEVICE) MISC Use as directed 4 times daily to test blood sugar 1 each 3   LINZESS 290 MCG CAPS capsule TAKE 1 CAPSULE BY MOUTH DAILY BEFORE BREAKFAST. 30 capsule 3   LORazepam (ATIVAN) 0.5 MG tablet Take 0.5 mg by mouth every 12 (twelve) hours as needed for anxiety.     meloxicam (MOBIC) 15 MG tablet Take 1  tablet (15 mg total) by mouth daily. 30 tablet 1   metFORMIN  (GLUCOPHAGE-XR) 500 MG 24 hr tablet Take 4 tablets (2,000 mg total) by mouth daily with breakfast. 360 tablet 3   metoCLOPramide (REGLAN) 10 MG tablet Take 1 tablet (10 mg total) by mouth every 6 (six) hours. 30 tablet 0   Microlet Lancets MISC 1 each by Does not apply route 2 (two) times daily. E11.9 100 each 2   nitrofurantoin, macrocrystal-monohydrate, (MACROBID) 100 MG capsule Take 1 capsule (100 mg total) by mouth 2 (two) times daily. 10 capsule 0   nitroGLYCERIN (NITROSTAT) 0.4 MG SL tablet Place 1 tablet (0.4 mg total) under the tongue every 5 (five) minutes as needed for chest pain. Reported on 01/05/2016 25 tablet 6   nystatin (MYCOSTATIN) 100000 UNIT/ML suspension nystatin 100,000 unit/mL oral suspension  SWISH, GARGLE, AND SPIT 20 ML 4 TIMES A DAY UNTIL RESOLVED     ondansetron (ZOFRAN-ODT) 4 MG disintegrating tablet Take 1 tablet (4 mg total) by mouth every 8 (eight) hours as needed for nausea or vomiting. 20 tablet 0   oxyCODONE-acetaminophen (PERCOCET/ROXICET) 5-325 MG tablet Take 1 tablet by mouth every 4 (four) hours as needed.     phenazopyridine (PYRIDIUM) 100 MG tablet Take 1 tablet (100 mg total) by mouth 3 (three) times daily as needed for pain. 6 tablet 0   RABEprazole (ACIPHEX) 20 MG tablet Take 1 tablet (20 mg total) by mouth daily. 90 tablet 3   telmisartan (MICARDIS) 20 MG tablet TAKE 1 TABLET BY MOUTH EVERY DAY 90 tablet 1   venlafaxine XR (EFFEXOR-XR) 75 MG 24 hr capsule TAKE 1 CAPSULE BY MOUTH TWICE A DAY 180 capsule 3   benzonatate (TESSALON PERLES) 100 MG capsule Take 1 capsule (100 mg total) by mouth 3 (three) times daily as needed for cough. (Patient not taking: Reported on 02/15/2022) 20 capsule 0   No facility-administered medications prior to visit.    Allergies  Allergen Reactions   Methotrexate Other (See Comments)    Peri-oral and buccal lesions   Vancomycin Other (See Comments)    DOSE RELATED NEPHROTOXICITY   Lisinopril Cough   Chlorhexidine Itching    Clindamycin/Lincomycin Nausea And Vomiting and Rash   Doxycycline Rash   Teflaro [Ceftaroline] Rash and Other (See Comments)    Tolerates ceftriaxone    ROS neg/noncontributory except as noted HPI/below No heartburn      Objective:     BP 138/78   Pulse 82   Temp 98.2 F (36.8 C)   Ht '5\' 6"'$  (1.676 m)   Wt 226 lb (102.5 kg)   SpO2 96%   BMI 36.48 kg/m  Wt Readings from Last 3 Encounters:  03/15/22 226 lb (102.5 kg)  02/15/22 220 lb 12.8 oz (100.2 kg)  02/12/22 221 lb 6.4 oz (100.4 kg)    Physical Exam   Gen: WDWN NAD OWF HEENT: NCAT, conjunctiva not injected, sclera nonicteric TM WNL B, OP dry, no exudates .  hoarse NECK:  supple, no thyromegaly,+ mildly enlg B sub mandibular nodes, no carotid bruits CARDIAC: RRR, S1S2+, no murmur.  LUNGS: CTAB. No wheezes EXT:  no edema MSK: no gross abnormalities.  NEURO: A&O x3.  CN II-XII intact.  PSYCH: normal mood. Good eye contact     Assessment & Plan:   Problem List Items Addressed This Visit   None Visit Diagnoses     Hoarseness    -  Primary      Hoarseness-some cough.  Suspect viral.  Voice rest, fluids.  Amox to hold if getting worse, new symptoms, worse cough-pt has asthma, DM.  No pred for now as DM, but if lungs flare, will add  Meds ordered this encounter  Medications   amoxicillin (AMOXIL) 875 MG tablet    Sig: Take 1 tablet (875 mg total) by mouth 2 (two) times daily.    Dispense:  14 tablet    Refill:  0    Wellington Hampshire, MD

## 2022-03-15 NOTE — Patient Instructions (Signed)
Aquaphor ointment for lips  Drink plenty of water.   Amoxicillin if getting worse. Or new symptoms

## 2022-03-23 DIAGNOSIS — R29898 Other symptoms and signs involving the musculoskeletal system: Secondary | ICD-10-CM | POA: Diagnosis not present

## 2022-03-23 DIAGNOSIS — M75101 Unspecified rotator cuff tear or rupture of right shoulder, not specified as traumatic: Secondary | ICD-10-CM | POA: Diagnosis not present

## 2022-03-23 DIAGNOSIS — M7551 Bursitis of right shoulder: Secondary | ICD-10-CM | POA: Diagnosis not present

## 2022-03-23 DIAGNOSIS — M25511 Pain in right shoulder: Secondary | ICD-10-CM | POA: Diagnosis not present

## 2022-03-26 DIAGNOSIS — Z4789 Encounter for other orthopedic aftercare: Secondary | ICD-10-CM | POA: Diagnosis not present

## 2022-03-29 ENCOUNTER — Ambulatory Visit: Payer: Self-pay

## 2022-03-29 ENCOUNTER — Ambulatory Visit (INDEPENDENT_AMBULATORY_CARE_PROVIDER_SITE_OTHER): Payer: BC Managed Care – PPO

## 2022-03-29 ENCOUNTER — Encounter: Payer: Self-pay | Admitting: Family Medicine

## 2022-03-29 ENCOUNTER — Ambulatory Visit (INDEPENDENT_AMBULATORY_CARE_PROVIDER_SITE_OTHER): Payer: BC Managed Care – PPO | Admitting: Family Medicine

## 2022-03-29 VITALS — BP 132/70 | HR 71 | Ht 66.0 in | Wt 231.4 lb

## 2022-03-29 DIAGNOSIS — E119 Type 2 diabetes mellitus without complications: Secondary | ICD-10-CM | POA: Diagnosis not present

## 2022-03-29 DIAGNOSIS — M79642 Pain in left hand: Secondary | ICD-10-CM | POA: Diagnosis not present

## 2022-03-29 DIAGNOSIS — D869 Sarcoidosis, unspecified: Secondary | ICD-10-CM | POA: Diagnosis not present

## 2022-03-29 DIAGNOSIS — G5602 Carpal tunnel syndrome, left upper limb: Secondary | ICD-10-CM

## 2022-03-29 DIAGNOSIS — H524 Presbyopia: Secondary | ICD-10-CM | POA: Diagnosis not present

## 2022-03-29 DIAGNOSIS — Z961 Presence of intraocular lens: Secondary | ICD-10-CM | POA: Diagnosis not present

## 2022-03-29 LAB — HM DIABETES EYE EXAM

## 2022-03-29 MED ORDER — GABAPENTIN 300 MG PO CAPS: ORAL_CAPSULE | ORAL | 3 refills | Status: DC

## 2022-03-29 MED ORDER — HYDROCODONE-ACETAMINOPHEN 5-325 MG PO TABS: 1.0000 | ORAL_TABLET | Freq: Four times a day (QID) | ORAL | 0 refills | Status: DC | PRN

## 2022-03-29 NOTE — Progress Notes (Signed)
I, [name], LAT, ATC, am serving as scribe for Dr. Lynne [name].  Subjective:    CC: L hand pain  HPI: [name] a 61 y/o female c/o L hand pain since Friday, 03/26/22, that she injured in a fall. Pt was previously seen by Dr. Georgina Snell in 2021 for low back and R hip pain. Today, pt reports L hand pain since 03/26/22 when she fell while chasing after her dog and landed on her L hand. Pt locates pain to her entire L hand w/ some radiating pain into her L wrist.  Swelling: slight L UE paresthesias: yes Aggravating factors: increased pain at night; L hand/wrist AROM Treatments tried: topical pain-relieving cream; Tylenol  Pertinent review of Systems: Fevers or chills  Relevant historical information: Sarcoidosis history.  History of prior long-term use of steroids with decreased bone mineral density.   Objective:    Vitals:   03/29/22 1410  BP: 132/70  Pulse: 71  SpO2: 97%   General: Well Developed, well nourished, and in no acute distress.   MSK: Left hand and wrist swollen wrist. Tender palpation anatomical snuffbox and dorsal ulnar wrist. Decreased wrist motion. Positive Tinel's at carpal tunnel. Intact grip strength. Pulses cap refill and sensation are intact distally.  Left elbow normal-appearing nontender normal motion.  Lab and Radiology Results  X-ray images left hand obtained today personally and independently interpreted No acute fractures are visible. Degenerative changes of the first [facility] present. Await formal radiology review   Impression and Recommendations:    Assessment and Plan: 61 y.o. female with left wrist pain after a fall.  This is concerning for radiographically occult fracture.  Plan for thumb spica splint and recheck in about a week.  Additionally she is having worsening carpal tunnel syndrome symptoms.  Will increase gabapentin from 300 at night to 100 in the morning and at lunch and 400 at night.  Patient prescribed hydrocodone.  Recheck in  about a week.  PDMP reviewed during this encounter. Orders Placed This Encounter  Procedures   DG Hand Complete Left    Standing Status:   Future    Number of Occurrences:   1    Standing Expiration Date:   04/28/2022    Order Specific Question:   Reason for Exam (SYMPTOM  OR DIAGNOSIS REQUIRED)    Answer:   left hand pain    Order Specific Question:   Preferred imaging location?    Answer:   [facility]   [place] LIMITED JOINT SPACE STRUCTURES UP LEFT(NO LINKED CHARGES)    Order Specific Question:   Reason for Exam (SYMPTOM  OR DIAGNOSIS REQUIRED)    Answer:   L hand pain    Order Specific Question:   Preferred imaging location?    Answer:   [place]   Meds ordered this encounter  Medications   gabapentin (NEURONTIN) 300 MG capsule    Sig: Take at night in addition to the '100mg'$  gabapentin to get '400mg'$  at night and '100mg'$  in the morning and at mid day.    Dispense:  90 capsule    Refill:  3   HYDROcodone-acetaminophen (NORCO/VICODIN) 5-325 MG tablet    Sig: Take 1 tablet by mouth every 6 (six) hours as needed.    Dispense:  15 tablet    Refill:  0    Discussed warning signs or symptoms. Please see discharge instructions. Patient expresses understanding.   The above documentation has been reviewed and is accurate and complete  [name], M.D.

## 2022-03-29 NOTE — Patient Instructions (Addendum)
Good to see you today.  L thumb spica splint.  Follow-up: one week

## 2022-03-30 NOTE — Progress Notes (Signed)
Left hand x-ray shows no fractures.  There are arthritis changes at the base of the thumb.

## 2022-04-07 ENCOUNTER — Ambulatory Visit (INDEPENDENT_AMBULATORY_CARE_PROVIDER_SITE_OTHER): Payer: BC Managed Care – PPO

## 2022-04-07 ENCOUNTER — Ambulatory Visit (INDEPENDENT_AMBULATORY_CARE_PROVIDER_SITE_OTHER): Payer: BC Managed Care – PPO | Admitting: Family Medicine

## 2022-04-07 VITALS — BP 150/78 | HR 90 | Ht 66.0 in | Wt 225.0 lb

## 2022-04-07 DIAGNOSIS — M25532 Pain in left wrist: Secondary | ICD-10-CM | POA: Diagnosis not present

## 2022-04-07 DIAGNOSIS — M19032 Primary osteoarthritis, left wrist: Secondary | ICD-10-CM | POA: Diagnosis not present

## 2022-04-07 NOTE — Patient Instructions (Signed)
Thank you for coming in today.   Please get an Xray today before you leave   Recheck in 2 weeks.   We may get an MRI based on the xray

## 2022-04-07 NOTE — Progress Notes (Signed)
   I, Peterson Lombard, LAT, ATC acting as a scribe for [name], MD.  [name] is a 61 y.o. female who presents to [place] at [facility] today for L hand pain,  concerning for radiographically occult fracture, worsening CTS, since Friday, 03/26/22, that she injured in a fall. Pt was last seen by Dr. Georgina Snell on 03/29/22 and was advised to wear a thumb spica splint, advised to increase her gabapentin dose, and was prescribed hydrocodone. Today, pt reports L hand is still painful. Pt has been compliant w/ wearing the thumb spica splint. Pt locates pain to the ulnar side of her L wrist.   Dx imaging: 03/29/22 L hand XR  Pertinent review of systems: No fevers or chills  Relevant historical information: Heart failure.  Sarcoidosis.   Exam:  BP (!) 150/78   Pulse 90   Ht '5\' 6"'$  (1.676 m)   Wt 225 lb (102.1 kg)   SpO2 97%   BMI 36.32 kg/m  General: Well Developed, well nourished, and in no acute distress.   MSK: Left wrist: Effusion present swelling present over the ulnar wrist. Tender palpation at ulnar wrist and base of thumb. Decreased wrist motion. Intact strength.   Lab and Radiology Results  X-ray images left wrist obtained today personally and independently interpreted No acute fractures are visible.  Significant degenerative changes are present at the first [name] and scaphoid. Await formal radiology review    Assessment and Plan: 61 y.o. female with continued left wrist pain.  Patient fell and injured her wrist.  She has pain and swelling at the ulnar wrist.  It is possible she does have a radiographically occult fracture.  Await radiology over read today's x-ray.  However if x-ray report continues to remain negative she may benefit from MRI arthrogram to further evaluate structural injury to the wrist.  For now continue thumb spica splint check back in 2 weeks.   PDMP not reviewed this encounter. Orders Placed This Encounter  Procedures   DG Wrist  Complete Left    Standing Status:   Future    Number of Occurrences:   1    Standing Expiration Date:   04/08/2023    Order Specific Question:   Reason for Exam (SYMPTOM  OR DIAGNOSIS REQUIRED)    Answer:   eval left wrist pain    Order Specific Question:   Preferred imaging location?    Answer:   [facility]   No orders of the defined types were placed in this encounter.    Discussed warning signs or symptoms. Please see discharge instructions. Patient expresses understanding.   The above documentation has been reviewed and is accurate and complete [name], M.D.

## 2022-04-08 ENCOUNTER — Encounter: Payer: Self-pay | Admitting: Family Medicine

## 2022-04-09 ENCOUNTER — Telehealth: Payer: Self-pay | Admitting: Family Medicine

## 2022-04-09 DIAGNOSIS — M25532 Pain in left wrist: Secondary | ICD-10-CM

## 2022-04-09 NOTE — Progress Notes (Signed)
Wrist x-ray shows no fractures.  You do have arthritis at the base of the thumb.  We will proceed to MRI arthrogram of the wrist.  You should hear soon about scheduling the MRI.

## 2022-04-09 NOTE — Telephone Encounter (Signed)
MRI arthrogram ordered.

## 2022-04-15 DIAGNOSIS — H0100B Unspecified blepharitis left eye, upper and lower eyelids: Secondary | ICD-10-CM | POA: Diagnosis not present

## 2022-04-15 DIAGNOSIS — H04123 Dry eye syndrome of bilateral lacrimal glands: Secondary | ICD-10-CM | POA: Diagnosis not present

## 2022-04-15 DIAGNOSIS — H0100A Unspecified blepharitis right eye, upper and lower eyelids: Secondary | ICD-10-CM | POA: Diagnosis not present

## 2022-04-15 DIAGNOSIS — H10413 Chronic giant papillary conjunctivitis, bilateral: Secondary | ICD-10-CM | POA: Diagnosis not present

## 2022-04-19 ENCOUNTER — Ambulatory Visit (INDEPENDENT_AMBULATORY_CARE_PROVIDER_SITE_OTHER): Payer: BC Managed Care – PPO

## 2022-04-19 ENCOUNTER — Ambulatory Visit (INDEPENDENT_AMBULATORY_CARE_PROVIDER_SITE_OTHER): Payer: BC Managed Care – PPO | Admitting: Sports Medicine

## 2022-04-19 DIAGNOSIS — M25532 Pain in left wrist: Secondary | ICD-10-CM

## 2022-04-19 DIAGNOSIS — M79642 Pain in left hand: Secondary | ICD-10-CM | POA: Insufficient documentation

## 2022-04-19 DIAGNOSIS — G8929 Other chronic pain: Secondary | ICD-10-CM

## 2022-04-19 DIAGNOSIS — M19032 Primary osteoarthritis, left wrist: Secondary | ICD-10-CM | POA: Diagnosis not present

## 2022-04-19 MED ORDER — GADOBUTROL 1 MMOL/ML IV SOLN
1.0000 mL | Freq: Once | INTRAVENOUS | Status: AC | PRN
  Administered 2022-04-19: 1 mL

## 2022-04-19 NOTE — Assessment & Plan Note (Signed)
This is a very pleasant 61 year old female, chronic left wrist pain, she localizes pain both at the radiocarpal joint as well as at the [facility], I did personally review her x-rays which showed CMC osteoarthritis and mild radiocarpal osteoarthritis. She is referred to me for MR arthrography. We did her arthrogram injection, further management per primary treating provider.

## 2022-04-19 NOTE — Progress Notes (Signed)
    Procedures performed today:    Procedure: Real-time Ultrasound Guided gadolinium contrast injection of left radiocarpal joint Device: Samsung HS60  Verbal informed consent obtained.  Time-out conducted.  Noted no overlying erythema, induration, or other signs of local infection.  Skin prepped in a sterile fashion.  Local anesthesia: Topical Ethyl chloride.  With sterile technique and under real time ultrasound guidance: Noted some arthritic changes, synovitis, 25-gauge needle advanced into the radiocarpal joint, I injected 1 cc lidocaine, 1 cc kenalog 40, syringe switched and 0.05 cc gadolinium injected, syringe switched and about 2 to 3 cc sterile saline used to fully distend the joint. Joint visualized and capsule seen distending confirming intra-articular placement of contrast material and medication. Completed without difficulty  Advised to call if fevers/chills, erythema, induration, drainage, or persistent bleeding.  Images permanently stored in PACS Impression: Technically successful ultrasound guided gadolinium contrast injection for MR arthrography.  Please see separate MR arthrogram report.  Independent interpretation of notes and tests performed by another provider:   None.  Brief History, Exam, Impression, and Recommendations:    Chronic pain of left wrist This is a very pleasant 61 year old female, chronic left wrist pain, she localizes pain both at the radiocarpal joint as well as at the [facility], I did personally review her x-rays which showed CMC osteoarthritis and mild radiocarpal osteoarthritis. She is referred to me for MR arthrography. We did her arthrogram injection, further management per primary treating provider.    ____________________________________________ Gwen Her. Dianah Field, M.D., ABFM., CAQSM., AME. Primary Care and Sports Medicine [facility]  Adjunct Professor of [employer] [facility] [employer]  [employer]  Risk manager

## 2022-04-20 NOTE — Progress Notes (Unsigned)
[name], am serving as a Education administrator for Dr. [name].  [name] is a 61 y.o. female who presents to [place] at [facility] today for f/u of L hand pain and L CTS after suffering a fall on 03/26/22.  She was last seen by Dr. Georgina Snell on 04/07/22 and noted con't pain despite wearing her thumb spica splint and increasing her gabapentin dose.  She was referred for a L wrist MRI and is here to review those results.  Today, pt reports that wrist and hand is feeling better but still has the pain at the wrist and up the thumb.  Diagnostic testing: L wrist MRI- 04/19/22; L wrist XR- 04/07/22; L hand XR- 03/29/22   Pertinent review of systems: No fevers or chills  Relevant historical information: Heart disease.  Sarcoidosis.   Exam:  BP 138/78   Pulse 81   Ht '5\' 6"'$  (1.676 m)   Wt 225 lb (102.1 kg)   SpO2 97%   BMI 36.32 kg/m  General: Well Developed, well nourished, and in no acute distress.   MSK: Left thumb: Tender palpation first [place].  Decreased thumb motion.    Lab and Radiology Results  Procedure: Real-time Ultrasound Guided Injection of left first [facility] Device: Philips Affiniti 50G Images permanently stored and available for review in PACS Verbal informed consent obtained.  Discussed risks and benefits of procedure. Warned about infection, bleeding, hyperglycemia damage to structures among others. Patient expresses understanding and agreement Time-out conducted.   Noted no overlying erythema, induration, or other signs of local infection.   Skin prepped in a sterile fashion.   Local anesthesia: Topical Ethyl chloride.   With sterile technique and under real time ultrasound guidance: 30 mg of Kenalog and 0.75 mL of lidocaine injected into first [place]. Fluid seen entering the thumb joint.   Completed without difficulty   Pain immediately resolved suggesting accurate placement of the medication.   Advised to call if fevers/chills, erythema, induration,  drainage, or persistent bleeding.   Images permanently stored and available for review in the ultrasound unit.  Impression: Technically successful ultrasound guided injection.      No results found. However, due to the size of the patient record, not all encounters were searched. Please check Results Review for a complete set of results. MR WRIST LEFT W CONTRAST  Result Date: 04/20/2022 CLINICAL DATA:  [place] 3 weeks ago.  Persistent wrist pain. EXAM: MR OF THE LEFT WRIST WITH CONTRAST TECHNIQUE: Multiplanar, multisequence MR imaging of the left wrist was performed following the administration of intravenous contrast. COMPARISON:  Radiographs 04/05/2022 CONTRAST:  69m GADAVIST GADOBUTROL 1 MMOL/ML IV SOLN FINDINGS: There is contrast in the midcarpal joint space but I do not have any images of with needle was placed. There is contrast in the lunotriquetral and scapholunate joints but I do not see any definite ligament tears. There is also contrast in the carpal tunnel which could be from extravasation or needle placement. Ligaments: The intercarpal ligaments appear intact. Triangular fibrocartilage: Intact. No extravasation of contrast into the radioulnar joint space. Tendons: The dorsal wrist tendons are intact. No tendinopathy or tenosynovitis. Carpal tunnel/median nerve: The carpal tunnel tendons appear normal. The carpal tunnel is filled with contrast as above. The median nerve is grossly normal. Guyon's canal: Normal appearance of the ulnar nerve. Joint/cartilage: The articular cartilage appears intact. No cartilage defects or osteochondral lesion. No obvious synovitis. Bones/carpal alignment: The carpal alignment is normal. No marrow edema or occult  fracture. Other: Moderate to advanced degenerative changes are noted at the [facility] joint of the thumb. IMPRESSION: 1. Contrast in the midcarpal joint space but no definite scapholunate or lunotriquetral ligament tear. Contrast in the carpal tunnel could be  from extravasation or needle placement. 2. Intact intercarpal ligaments and TFCC. 3. No acute bony findings. 4. The major wrist tendons are intact. 5. Moderate to advanced degenerative changes at the [facility] joint of the thumb. Electronically Signed   By: Marijo Sanes M.D.   On: 04/20/2022 15:56   [place] LIMITED JOINT SPACE STRUCTURES UP LEFT  Result Date: 04/19/2022 Procedure: Real-time Ultrasound Guided gadolinium contrast injection of left radiocarpal joint Device: Samsung HS60 Verbal informed consent obtained. Time-out conducted. Noted no overlying erythema, induration, or other signs of local infection. Skin prepped in a sterile fashion. Local anesthesia: Topical Ethyl chloride. With sterile technique and under real time ultrasound guidance: Noted some arthritic changes, synovitis, 25-gauge needle advanced into the radiocarpal joint, I injected 1 cc lidocaine, 1 cc kenalog 40, syringe switched and 0.05 cc gadolinium injected, syringe switched and about 2 to 3 cc sterile saline used to fully distend the joint. Joint visualized and capsule seen distending confirming intra-articular placement of contrast material and medication. Completed without difficulty Advised to call if fevers/chills, erythema, induration, drainage, or persistent bleeding. Images permanently stored in PACS Impression: Technically successful ultrasound guided gadolinium contrast injection for MR arthrography.  Please see separate MR arthrogram report.  I, [name], personally (independently) visualized and performed the interpretation of the MRI images attached in this note.     Assessment and Plan: 61 y.o. female with left wrist and thumb pain.  Majority the pain is thought to be due to first [facility] DJD.  She did have a steroid injection in her wrist as part of the arthrogram component of the MRI arthrogram on July 10.  However looking at the MRI results the majority of her pain I think is coming from her first [facility].  We discussed options.   Plan for steroid injection first [place] today and check back in 1 month.  Okay to start liberalizing activity and use of her thumb.   PDMP not reviewed this encounter. Orders Placed This Encounter  Procedures   [place] LIMITED JOINT SPACE STRUCTURES UP LEFT(NO LINKED CHARGES)    Standing Status:   Future    Number of Occurrences:   1    Standing Expiration Date:   04/22/2023    Order Specific Question:   Reason for Exam (SYMPTOM  OR DIAGNOSIS REQUIRED)    Answer:   Left wrist pain    Order Specific Question:   Preferred imaging location?    Answer:   [place]   No orders of the defined types were placed in this encounter.    Discussed warning signs or symptoms. Please see discharge instructions. Patient expresses understanding.   The above documentation has been reviewed and is accurate and complete [name], M.D.

## 2022-04-21 ENCOUNTER — Ambulatory Visit: Payer: Self-pay

## 2022-04-21 ENCOUNTER — Ambulatory Visit (INDEPENDENT_AMBULATORY_CARE_PROVIDER_SITE_OTHER): Payer: BC Managed Care – PPO | Admitting: Family Medicine

## 2022-04-21 VITALS — BP 138/78 | HR 81 | Ht 66.0 in | Wt 225.0 lb

## 2022-04-21 DIAGNOSIS — M25532 Pain in left wrist: Secondary | ICD-10-CM

## 2022-04-21 DIAGNOSIS — M79645 Pain in left finger(s): Secondary | ICD-10-CM | POA: Diagnosis not present

## 2022-04-21 DIAGNOSIS — M75101 Unspecified rotator cuff tear or rupture of right shoulder, not specified as traumatic: Secondary | ICD-10-CM | POA: Diagnosis not present

## 2022-04-21 DIAGNOSIS — M7551 Bursitis of right shoulder: Secondary | ICD-10-CM | POA: Diagnosis not present

## 2022-04-21 DIAGNOSIS — R29898 Other symptoms and signs involving the musculoskeletal system: Secondary | ICD-10-CM | POA: Diagnosis not present

## 2022-04-21 DIAGNOSIS — M25511 Pain in right shoulder: Secondary | ICD-10-CM | POA: Diagnosis not present

## 2022-04-21 NOTE — Progress Notes (Signed)
We will talk about the results when you return for a visit today but the main finding is arthritis at the base of the thumb.  No fracture or significant ligament or tendon tear is seen on the MRI.

## 2022-04-21 NOTE — Patient Instructions (Signed)
Good to see you Okay to stop using the wrist brace  Injection given in left wrist today Call or go to the ER if you develop a large red swollen joint with extreme pain or oozing puss.  Follow up in a month

## 2022-04-25 ENCOUNTER — Other Ambulatory Visit: Payer: Self-pay | Admitting: Endocrinology

## 2022-04-25 DIAGNOSIS — E1129 Type 2 diabetes mellitus with other diabetic kidney complication: Secondary | ICD-10-CM

## 2022-04-27 DIAGNOSIS — H10413 Chronic giant papillary conjunctivitis, bilateral: Secondary | ICD-10-CM | POA: Diagnosis not present

## 2022-04-27 DIAGNOSIS — H04123 Dry eye syndrome of bilateral lacrimal glands: Secondary | ICD-10-CM | POA: Diagnosis not present

## 2022-04-27 DIAGNOSIS — H0100A Unspecified blepharitis right eye, upper and lower eyelids: Secondary | ICD-10-CM | POA: Diagnosis not present

## 2022-04-27 DIAGNOSIS — H0100B Unspecified blepharitis left eye, upper and lower eyelids: Secondary | ICD-10-CM | POA: Diagnosis not present

## 2022-04-28 ENCOUNTER — Ambulatory Visit (INDEPENDENT_AMBULATORY_CARE_PROVIDER_SITE_OTHER): Payer: BC Managed Care – PPO | Admitting: Family Medicine

## 2022-04-28 ENCOUNTER — Encounter: Payer: Self-pay | Admitting: Family Medicine

## 2022-04-28 ENCOUNTER — Telehealth: Payer: Self-pay | Admitting: Family Medicine

## 2022-04-28 ENCOUNTER — Other Ambulatory Visit: Payer: Self-pay | Admitting: Family Medicine

## 2022-04-28 VITALS — BP 130/62 | HR 85 | Temp 98.0°F | Ht 66.0 in | Wt 222.5 lb

## 2022-04-28 DIAGNOSIS — J0100 Acute maxillary sinusitis, unspecified: Secondary | ICD-10-CM | POA: Diagnosis not present

## 2022-04-28 DIAGNOSIS — R110 Nausea: Secondary | ICD-10-CM | POA: Diagnosis not present

## 2022-04-28 DIAGNOSIS — R300 Dysuria: Secondary | ICD-10-CM | POA: Diagnosis not present

## 2022-04-28 LAB — POCT URINALYSIS DIPSTICK
Bilirubin, UA: NEGATIVE
Blood, UA: NEGATIVE
Glucose, UA: NEGATIVE
Ketones, UA: NEGATIVE
Leukocytes, UA: NEGATIVE
Nitrite, UA: NEGATIVE
Protein, UA: NEGATIVE
Spec Grav, UA: 1.01 (ref 1.010–1.025)
Urobilinogen, UA: 0.2 E.U./dL
pH, UA: 6 (ref 5.0–8.0)

## 2022-04-28 MED ORDER — AMOXICILLIN-POT CLAVULANATE 875-125 MG PO TABS: 1.0000 | ORAL_TABLET | Freq: Two times a day (BID) | ORAL | 0 refills | Status: DC

## 2022-04-28 NOTE — Telephone Encounter (Signed)
Pt states medication is not covered by her insurance. She is asking if something else can be called in soon. Please advise.  MEDICATION:RABEprazole (ACIPHEX) 20 MG tablet  PHARMACY: [name] [id number] [place] [name], [address] Phone:  [phone]  Fax:  [phone]

## 2022-04-28 NOTE — Progress Notes (Signed)
Subjective:     Patient ID: [name], female    DOB: 15-Jun-1961, 61 y.o.   MRN: [id number]  Chief Complaint  Patient presents with   Headache    On going headache started a few weeks ago, hurt under eyes Has been taking tylenol for pain   Dysuria    Dysuria started a couple of days ago    HPI  Dysuria for 2 days.  No back pain.  +urgency/frequency/not emptying.  No f/c.  No blood.  H/o UTI's.  2-3 just this year.  Already on 1/2 tab macrodantin daily.  Sees urol.   HA sinus area.  For few wks.  Intermitt pressure.  No f/c.  Some runny nose and congestion and some in chest.  Has sarcoidosis. Pain worse to bend over.  Some cough.  Using flonase-not help.  Meds not helping.  Some nausea after eats or first am.  Dry heaves. Trulicity for long time, but on and off it as hard to get.    There are no preventive care reminders to display for this patient.  Past Medical History:  Diagnosis Date   Abnormal SPEP 04/17/2014   Acute left ankle pain 01/26/2017   ANEMIA-UNSPECIFIED 09/18/2009   Anxiety    Arthritis    Blood transfusion without reported diagnosis    Cataract    CHF (congestive heart failure) (HCC)    Chronic diastolic heart failure, NYHA class 2 (HCC)    Normal LVEDP by May 2018   COPD (chronic obstructive pulmonary disease) ([place])    Depression    DIABETES MELLITUS, TYPE II 08/21/2006   Diabetic osteomyelitis ([place]) 05/29/2015   Fatty liver    Fracture of 5th metatarsal    non union   GERD 73/71/0626   Had fundoplication   GOUT 94/85/4627   Hammer toe of right foot    2-5th toes   Hx of umbilical hernia repair    HYPERLIPIDEMIA 08/21/2006   HYPERTENSION 08/21/2006   Infection of wound due to methicillin resistant Staphylococcus aureus (MRSA)    Internal hemorrhoids    Kidney problem    Multiple allergies 10/14/2016   OBESITY 06/04/2009   Onychomycosis 10/27/2015   Osteomyelitis of left foot ([place]) 05/29/2015   Osteoporosis    Pulmonary sarcoidosis ([place])     Followed locally by pulmonology, but also by Dr. Casper Harrison at [facility]   Right knee pain 01/26/2017   Vocal cord dysfunction    Wears partial dentures     Past Surgical History:  Procedure Laterality Date   ABDOMINAL HYSTERECTOMY     APPENDECTOMY     BLADDER SUSPENSION  11/11/2011   Procedure: TRANSVAGINAL TAPE (TVT) PROCEDURE;  Surgeon: Olga Millers, MD;  Location: [facility];  Service: Gynecology;  Laterality: N/A;   CAROTIDS  02/18/2011   CAROTID DUPLEX; VERTEBRALS ARE PATENT WITH ANTEGRADE FLOW. ICA/CCA RATIO 1.61 ON RIGHT AND 0.75 ON LEFT   CATARACT EXTRACTION, BILATERAL     CHOLECYSTECTOMY  1984   COLONOSCOPY  04/29/2010   [name]   CYSTOSCOPY  11/11/2011   Procedure: CYSTOSCOPY;  Surgeon: Olga Millers, MD;  Location: [facility];  Service: Gynecology;  Laterality: N/A;   EXTUBATION (ENDOTRACHEAL) IN OR N/A 09/23/2017   Procedure: EXTUBATION (ENDOTRACHEAL) IN OR;  Surgeon: Helayne Seminole, MD;  Location: [place];  Service: ENT;  Laterality: N/A;   FIBEROPTIC LARYNGOSCOPY AND TRACHEOSCOPY N/A 09/23/2017   Procedure: FLEXIBLE FIBEROPTIC LARYNGOSCOPY;  Surgeon: Helayne Seminole, MD;  Location: [facility]  OR;  Service: ENT;  Laterality: N/A;   FRACTURE SURGERY     foot   HALLUX FUSION Left 10/02/2018   Procedure: HALLUX ARTHRODESIS;  Surgeon: Edrick Kins, DPM;  Location: [facility];  Service: Podiatry;  Laterality: Left;   HAMMER TOE SURGERY Left 10/02/2018   Procedure: HAMMER TOE CORRECTION 2ND 3RD 4RD FIFTH TOE;  Surgeon: Edrick Kins, DPM;  Location: [facility];  Service: Podiatry;  Laterality: Left;   HAMMER TOE SURGERY Right 04/12/2019   Procedure: HAMMER TOE CORRECTION, 2ND, 3RD, 4TH AND 5TH TOES OF RIGHT FOOT;  Surgeon: Edrick Kins, DPM;  Location: [place];  Service: Podiatry;  Laterality: Right;   HAMMER TOE SURGERY Left 10/25/2019   Procedure: HAMMER TOE REPAIR SECOND, THIRD, FOUTH;  Surgeon: Edrick Kins, DPM;  Location: [place] [facility];  Service: Podiatry;  Laterality: Left;    HERNIA REPAIR     I & D EXTREMITY Left 06/27/2015   Procedure: Partial Excision Left Calcaneus, Place Antibiotic Beads, and Wound VAC;  Surgeon: Newt Minion, MD;  Location: [place];  Service: Orthopedics;  Laterality: Left;   JOINT REPLACEMENT     KNEE ARTHROSCOPY     right   LEFT AND RIGHT HEART CATHETERIZATION WITH CORONARY ANGIOGRAM N/A 04/23/2013   Procedure: LEFT AND RIGHT HEART CATHETERIZATION WITH CORONARY ANGIOGRAM;  Surgeon: Leonie Man, MD;  Location: [facility] C[facility];  Service: Cardiovascular;  Laterality: N/A;   LEFT HEART CATH AND CORONARY ANGIOGRAPHY N/A 03/11/2017   Procedure: Left Heart Cath and Coronary Angiography;  Surgeon: Sherren Mocha, MD;  Location: [place] CV LAB; angiographically minimal CAD in the LAD otherwise normal.;  Normal LVEDP.  FALSE POSITIVE MYOVIEW   LEFT HEART CATH AND CORONARY ANGIOGRAPHY  07/20/2010   LVEF 50-55% WITH VERY MILD GLOBAL HYPOKINESIA; ESSENTIALLY NORMAL CORONARY ARTERIES; NORMAL LV FUNCTION   METATARSAL OSTEOTOMY WITH OPEN REDUCTION INTERNAL FIXATION (ORIF) METATARSAL WITH FUSION Left 04/09/2014   Procedure: LEFT FOOT FRACTURE OPEN TREATMENT METATARSAL INCLUDES INTERNAL FIXATION EACH;  Surgeon: Lorn Junes, MD;  Location: [place];  Service: Orthopedics;  Laterality: Left;   NISSEN FUNDOPLICATION  8938   NM MYOVIEW LTD --> FALSE POSITIVE  03/10/2017   : Moderate size "stress-induced "perfusion defect at the apex as well as "ill-defined stress-induced perfusion defect in the lateral wall.  EF 55%.  INTERMEDIATE risk. -->  FALSE POSITIVE   ORIF DISTAL FEMUR FRACTURE  08/08/2020   [facility]: ORIF of left extra-articular periprosthetic distal femur fracture with synthesis of the femur plate with cortical nonlocking screws.  (Thought to be pathologic fracture due to osteoporosis) -> after a fall   ORIF FEMUR W/ PERI-IMPLANT  08/29/2020   [facility]) revision of left femur fracture ORIF plate  fixation screws; culture positive for MSSA   Right and left CARDIAC CATHETERIZATION  04/23/2013   Angiographic normal coronaries; LVEDP 20 mmHg, PCWP 12-14 mmHg, RAP 12 mmHg.; Fick CO/CI 4.9/2.2   ROTATOR CUFF REPAIR Right 12/09/2021   SHOULDER ARTHROSCOPY Left 03/14/2019   Procedure: LEFT SHOULDER ARTHROSCOPY, DEBRIDEMENT, AND DECOMPRESSION;  Surgeon: Newt Minion, MD;  Location: [place];  Service: Orthopedics;  Laterality: Left;   TOTAL KNEE ARTHROPLASTY Right 06/29/2017   Procedure: RIGHT TOTAL KNEE ARTHROPLASTY;  Surgeon: Newt Minion, MD;  Location: [place];  Service: Orthopedics;  Laterality: Right;   TOTAL KNEE ARTHROPLASTY Left 12/07/2017   TOTAL KNEE ARTHROPLASTY Left 12/07/2017   Procedure: LEFT TOTAL KNEE ARTHROPLASTY;  Surgeon: Newt Minion, MD;  Location: [place];  Service: Orthopedics;  Laterality: Left;   TRACHEOSTOMY TUBE PLACEMENT N/A 09/20/2017   Procedure: AWAKE INTUBATION WITH ANESTHESIA WITH VIDEO ASSISTANCE;  Surgeon: Helayne Seminole, MD;  Location: [place] [facility];  Service: ENT;  Laterality: N/A;   TRANSTHORACIC ECHOCARDIOGRAM  08/2014   Normal LV size and function.  Mild LVH.  EF 55-60%.  Normal regional wall motion.  GR 1 DD.  Normal RV size and function .   TRANSTHORACIC ECHOCARDIOGRAM  08/12/2020    [facility]): Normal LV size and function.  EF 55 to 60%.  No RWM A.  Normal filling pattern.  Normal RV.  No significant valvular disease-stenosis or regurgitation.  No vegetation.   TUBAL LIGATION     with reversal in [place]      Outpatient Medications Prior to Visit  Medication Sig Dispense Refill   albuterol (PROVENTIL HFA;VENTOLIN HFA) 108 (90 Base) MCG/ACT inhaler Inhale 1-2 puffs into the lungs every 6 (six) hours as needed for wheezing or shortness of breath. 8 g 5   albuterol (PROVENTIL) (2.5 MG/3ML) 0.083% nebulizer solution USE 1 VIAL VIA NEBULIZXER EVERY 6 HOURS AS NEEDED FOR WHEEZING OR  SHORTNESS OF BREATH     allopurinol (ZYLOPRIM) 300 MG tablet TAKE 1 TABLET BY MOUTH EVERY DAY 90 tablet 0   aspirin EC 81 MG tablet Take 1 tablet (81 mg total) by mouth daily. 30 tablet 11   atorvastatin (LIPITOR) 40 MG tablet TAKE 1 TABLET BY MOUTH EVERY DAY 90 tablet 3   benzonatate (TESSALON PERLES) 100 MG capsule Take 1 capsule (100 mg total) by mouth 3 (three) times daily as needed for cough. 20 capsule 0   BREO ELLIPTA 200-25 MCG/INH AEPB TAKE 1 PUFF BY MOUTH EVERY DAY 60 each 5   buPROPion (WELLBUTRIN XL) 300 MG 24 hr tablet TAKE 1 TABLET BY MOUTH EVERY DAY 90 tablet 3   carvedilol (COREG) 25 MG tablet Take 1 tablet by mouth 2 (two) times daily with a meal.     Cholecalciferol 25 MCG (1000 UT) capsule Take 1,000 Units by mouth in the morning and at bedtime.     diclofenac Sodium (VOLTAREN) 1 % GEL Apply 1 application topically as needed (pain.).      diltiazem (CARDIZEM CD) 120 MG 24 hr capsule TAKE 1 CAPSULE BY MOUTH EVERY DAY 90 capsule 3   Dulaglutide (TRULICITY) 4.5 ZO/1.0RU SOPN Inject 4.5 mg as directed once a week. 6 mL 3   famotidine (PEPCID) 20 MG tablet Take 1 tablet (20 mg total) by mouth at bedtime. 30 tablet 3   fenofibrate 160 MG tablet TAKE 1 TABLET BY MOUTH EVERY DAY 90 tablet 3   fluticasone (FLONASE) 50 MCG/ACT nasal spray Place 2 sprays into both nostrils daily as needed for allergies. 48 mL 3   furosemide (LASIX) 40 MG tablet TAKE 1 TABLET BY MOUTH TWICE A DAY 180 tablet 1   gabapentin (NEURONTIN) 100 MG capsule TAKE 1 CAPSULE BY MOUTH THREE TIMES A DAY 270 capsule 3   gabapentin (NEURONTIN) 300 MG capsule Take at night in addition to the '100mg'$  gabapentin to get '400mg'$  at night and '100mg'$  in the morning and at mid day. 90 capsule 3   glucose blood (CONTOUR NEXT TEST) test strip 1 each by Other route 4 (four) times daily. And lancets 4/day 400 each 3   HYDROcodone-acetaminophen (NORCO/VICODIN) 5-325 MG tablet  Take 1 tablet by mouth every 6 (six) hours as needed. 15 tablet 0    insulin aspart (NOVOLOG FLEXPEN) 100 UNIT/ML FlexPen Inject 15 Units into the skin daily with supper. 15 mL 11   insulin glargine (LANTUS SOLOSTAR) 100 UNIT/ML Solostar Pen Inject 16 Units into the skin every morning. 30 mL 2   insulin glargine, 1 Unit Dial, (TOUJEO SOLOSTAR) 300 UNIT/ML Solostar Pen INJECT 40 UNITS INTO THE SKIN AT BEDTIME     Insulin Pen Needle (PEN NEEDLES) 32G X 4 MM MISC 1 each by Does not apply route 4 (four) times daily. E11.9 400 each 0   ipratropium (ATROVENT) 0.03 % nasal spray PLACE 2 SPRAYS INTO BOTH NOSTRILS EVERY 12 (TWELVE) HOURS. 30 mL 2   KLOR-CON M20 20 MEQ tablet TAKE 1 & 1/2 TABLETS BY MOUTH TWICE A DAY 270 tablet 2   lactulose (CHRONULAC) 10 GM/15ML solution TAKE 15 MLS BY MOUTH DAILY AS NEEDED FOR CONSTIPATION. 1419 mL 1   Lancet Devices (EASY MINI EJECT LANCING DEVICE) MISC Use as directed 4 times daily to test blood sugar 1 each 3   LINZESS 290 MCG CAPS capsule TAKE 1 CAPSULE BY MOUTH DAILY BEFORE BREAKFAST. 30 capsule 3   LORazepam (ATIVAN) 0.5 MG tablet Take 0.5 mg by mouth every 12 (twelve) hours as needed for anxiety.     meloxicam (MOBIC) 15 MG tablet Take 1 tablet (15 mg total) by mouth daily. 30 tablet 1   metFORMIN (GLUCOPHAGE-XR) 500 MG 24 hr tablet Take 4 tablets (2,000 mg total) by mouth daily with breakfast. 360 tablet 3   metoCLOPramide (REGLAN) 10 MG tablet Take 1 tablet (10 mg total) by mouth every 6 (six) hours. 30 tablet 0   Microlet Lancets MISC 1 each by Does not apply route 2 (two) times daily. E11.9 100 each 2   nitrofurantoin, macrocrystal-monohydrate, (MACROBID) 100 MG capsule Take 1 capsule (100 mg total) by mouth 2 (two) times daily. 10 capsule 0   nitroGLYCERIN (NITROSTAT) 0.4 MG SL tablet Place 1 tablet (0.4 mg total) under the tongue every 5 (five) minutes as needed for chest pain. Reported on 01/05/2016 25 tablet 6   nystatin (MYCOSTATIN) 100000 UNIT/ML suspension nystatin 100,000 unit/mL oral suspension  SWISH, GARGLE, AND SPIT  20 ML 4 TIMES A DAY UNTIL RESOLVED     ondansetron (ZOFRAN-ODT) 4 MG disintegrating tablet Take 1 tablet (4 mg total) by mouth every 8 (eight) hours as needed for nausea or vomiting. 20 tablet 0   oxyCODONE-acetaminophen (PERCOCET/ROXICET) 5-325 MG tablet Take 1 tablet by mouth every 4 (four) hours as needed.     phenazopyridine (PYRIDIUM) 100 MG tablet Take 1 tablet (100 mg total) by mouth 3 (three) times daily as needed for pain. 6 tablet 0   RABEprazole (ACIPHEX) 20 MG tablet Take 1 tablet (20 mg total) by mouth daily. 90 tablet 3   telmisartan (MICARDIS) 20 MG tablet TAKE 1 TABLET BY MOUTH EVERY DAY 90 tablet 1   traMADol (ULTRAM) 50 MG tablet TAKE 1 TABLET BY MOUTH EVERY 12 HOURS AS NEEDED FOR MODERATE TO SEVERE PAIN     triamcinolone cream (KENALOG) 0.1 % APPLY TOPICALLY TO RASH 2 TIMES DAILY AS NEEDED. STOP USE AFTER 7-10 DAYS     venlafaxine XR (EFFEXOR-XR) 75 MG 24 hr capsule TAKE 1 CAPSULE BY MOUTH TWICE A DAY 180 capsule 3   carvedilol (COREG) 12.5 MG tablet Take 12.5 mg by mouth 2 (two) times daily with a meal.  No facility-administered medications prior to visit.    Allergies  Allergen Reactions   Methotrexate Other (See Comments)    Peri-oral and buccal lesions   Vancomycin Other (See Comments)    DOSE RELATED NEPHROTOXICITY Other reaction(s): other   Lisinopril Cough    Other reaction(s): other   Chlorhexidine Itching   Clindamycin/Lincomycin Nausea And Vomiting and Rash   Lincomycin Nausea Only and Rash    Other reaction(s): vomiting   Teflaro [Ceftaroline] Rash and Other (See Comments)    Tolerates ceftriaxone    ROS neg/noncontributory except as noted HPI/below      Objective:     BP 130/62   Pulse 85   Temp 98 F (36.7 C) (Temporal)   Ht '5\' 6"'$  (1.676 m)   Wt 222 lb 8 oz (100.9 kg)   SpO2 97%   BMI 35.91 kg/m  Wt Readings from Last 3 Encounters:  04/28/22 222 lb 8 oz (100.9 kg)  04/21/22 225 lb (102.1 kg)  04/07/22 225 lb (102.1 kg)    Physical  Exam   Gen: WDWN NAD HEENT: NCAT, conjunctiva not injected, sclera nonicteric TM WNL B, OP moist, no exudates .  Max sinuses very tender to percussion NECK:  supple, no thyromegaly, no nodes,  CARDIAC: RRR, S1S2+, no murmur.  LUNGS: CTAB. No wheezes ABDOMEN:  BS+, soft, mildly tender suprapubically. No HSM, no masses. No cvat EXT:  no edema MSK: no gross abnormalities.  NEURO: A&O x3.  CN II-XII intact.  PSYCH: normal mood. Good eye contact  Results for orders placed or performed in visit on 04/28/22  POCT urinalysis dipstick  Result Value Ref Range   Color, UA yellow    Clarity, UA clear    Glucose, UA Negative Negative   Bilirubin, UA negative    Ketones, UA negative    Spec Grav, UA 1.010 1.010 - 1.025   Blood, UA negative    pH, UA 6.0 5.0 - 8.0   Protein, UA Negative Negative   Urobilinogen, UA 0.2 0.2 or 1.0 E.U./dL   Nitrite, UA negative    Leukocytes, UA Negative Negative   Appearance     Odor     *Note: Due to a large number of results and/or encounters for the requested time period, some results have not been displayed. A complete set of results can be found in Results Review.        Assessment & Plan:   Problem List Items Addressed This Visit   None Visit Diagnoses     Dysuria    -  Primary   Relevant Orders   POCT urinalysis dipstick (Completed)   Urine Culture   Acute non-recurrent maxillary sinusitis       Relevant Medications   amoxicillin-clavulanate (AUGMENTIN) 875-125 MG tablet   Nausea          Dysuria-symptoms most c/w UTI but ua neg.  Await cx.  Placing on augmentin for sinuses. Maxillary sinusitis-augmentin 875 bid x 10d Nausea-may be from Trulicity as on and off it.  May be other.  Can take zofran or reglan.  Consider lower dose of Trulicity if continues.  Or f/u GI.  Meds ordered this encounter  Medications   amoxicillin-clavulanate (AUGMENTIN) 875-125 MG tablet    Sig: Take 1 tablet by mouth 2 (two) times daily.    Dispense:  20  tablet    Refill:  0    Wellington Hampshire, MD

## 2022-04-28 NOTE — Patient Instructions (Signed)
It was very nice to see you today!  Augmentin for sinuses and hopefully cover urine if infection.  Culture takes 2-3 days.   Worse, no change, let us know  Nausea-take zofran or reglan   PLEASE NOTE:  If you had any lab tests please let us know if you have not heard back within a few days. You may see your results on MyChart before we have a chance to review them but we will give you a call once they are reviewed by [place]. If we ordered any referrals today, please let us know if you have not heard from their office within the next week.   Please try these tips to maintain a healthy lifestyle:  Eat most of your calories during the day when you are active. Eliminate processed foods including packaged sweets (pies, cakes, cookies), reduce intake of potatoes, white bread, white pasta, and white rice. Look for whole grain options, oat flour or almond flour.  Each meal should contain half fruits/vegetables, one quarter protein, and one quarter carbs (no bigger than a computer mouse).  Cut down on sweet beverages. This includes juice, soda, and sweet tea. Also watch fruit intake, though this is a healthier sweet option, it still contains natural sugar! Limit to 3 servings daily.  Drink at least 1 glass of water with each meal and aim for at least 8 glasses per day  Exercise at least 150 minutes every week.

## 2022-04-29 ENCOUNTER — Other Ambulatory Visit (INDEPENDENT_AMBULATORY_CARE_PROVIDER_SITE_OTHER): Payer: BC Managed Care – PPO

## 2022-04-29 DIAGNOSIS — E1129 Type 2 diabetes mellitus with other diabetic kidney complication: Secondary | ICD-10-CM

## 2022-04-29 DIAGNOSIS — Z794 Long term (current) use of insulin: Secondary | ICD-10-CM

## 2022-04-29 LAB — BASIC METABOLIC PANEL
BUN: 22 mg/dL (ref 6–23)
CO2: 30 mEq/L (ref 19–32)
Calcium: 9.6 mg/dL (ref 8.4–10.5)
Chloride: 99 mEq/L (ref 96–112)
Creatinine, Ser: 1.12 mg/dL (ref 0.40–1.20)
GFR: 52.61 mL/min — ABNORMAL LOW (ref >60.00)
Glucose, Bld: 293 mg/dL — ABNORMAL HIGH (ref 70–99)
Potassium: 4.9 mEq/L (ref 3.5–5.1)
Sodium: 137 mEq/L (ref 135–145)

## 2022-04-29 LAB — MICROALBUMIN / CREATININE URINE RATIO
Creatinine,U: 78 mg/dL
Microalb Creat Ratio: 2.5 mg/g (ref 0.0–30.0)
Microalb, Ur: 2 mg/dL — ABNORMAL HIGH (ref 0.0–1.9)

## 2022-04-29 LAB — URINE CULTURE
MICRO NUMBER:: 13667534
Result:: NO GROWTH
SPECIMEN QUALITY:: ADEQUATE

## 2022-04-29 LAB — HEMOGLOBIN A1C: Hgb A1c MFr Bld: 8.4 % — ABNORMAL HIGH (ref 4.6–6.5)

## 2022-04-29 MED ORDER — PANTOPRAZOLE SODIUM 40 MG PO TBEC: 40.0000 mg | DELAYED_RELEASE_TABLET | Freq: Every day | ORAL | 3 refills | Status: DC

## 2022-04-29 NOTE — Telephone Encounter (Signed)
That is perfect- great job [employer]

## 2022-04-29 NOTE — Telephone Encounter (Signed)
Called and spoke to pt and informed her to contact insurance to see which medications they will cover and give [place] those names so that DR. Yong Channel will know what he has to chose from. Pt states they will call and give [place] a cb.

## 2022-04-30 IMAGING — MR MR CERVICAL SPINE W/O CM
5 series · 35 of 48 positions shown · non-contrast
Comparison: Cervical spine CT 07/17/2010.

CLINICAL DATA: 59-year-old female with neck pain, bilateral
shoulder pain radiating down the right arm for 2 months. No known
injury.

EXAM:
MRI CERVICAL SPINE WITHOUT CONTRAST
TECHNIQUE: Multiplanar, multisequence MR imaging of the cervical spine was
performed. No intravenous contrast was administered.

[Series 2: T2 · sagittal · 3.0mm · 0.41mm/px · 8 of 15 slices shown (1 of 2)]
[im 1/15]
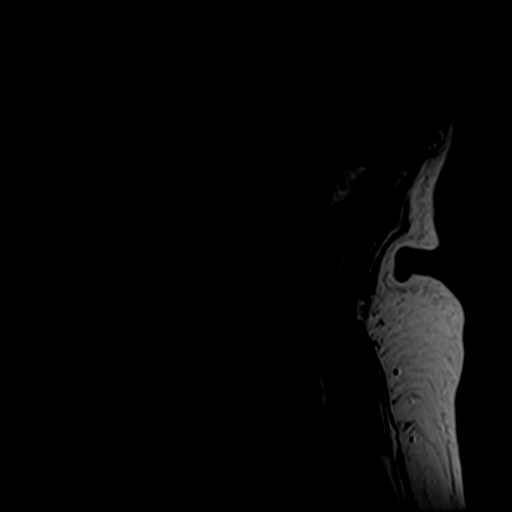
[im 3/15]
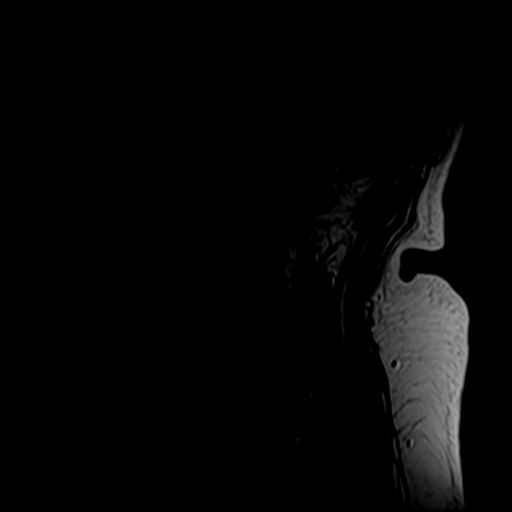
[im 5/15]
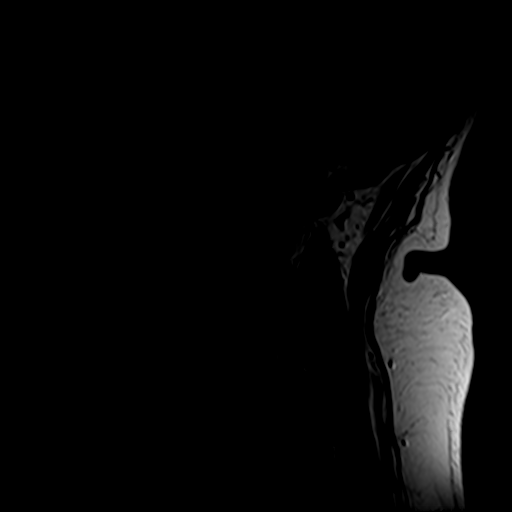
[im 7/15]
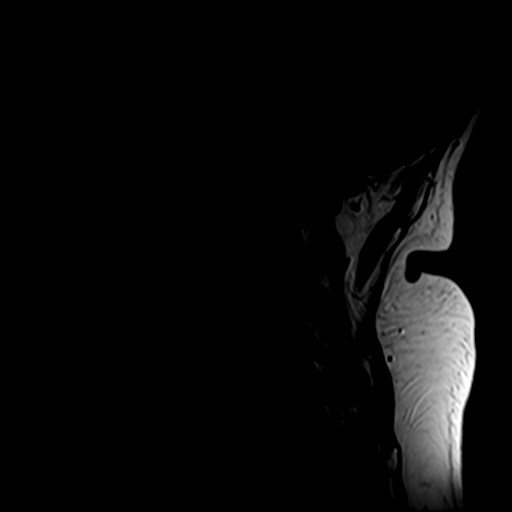
[im 9/15]
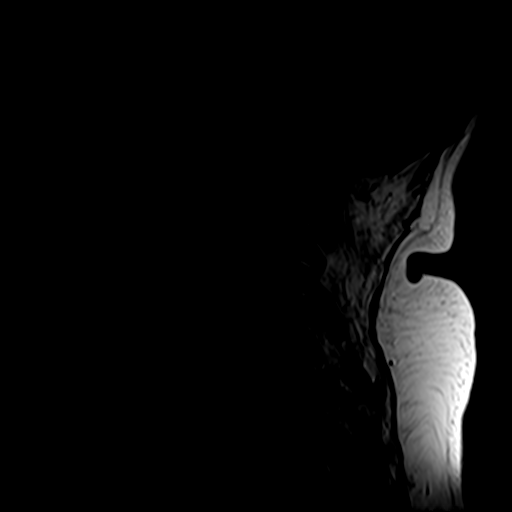
[im 11/15]
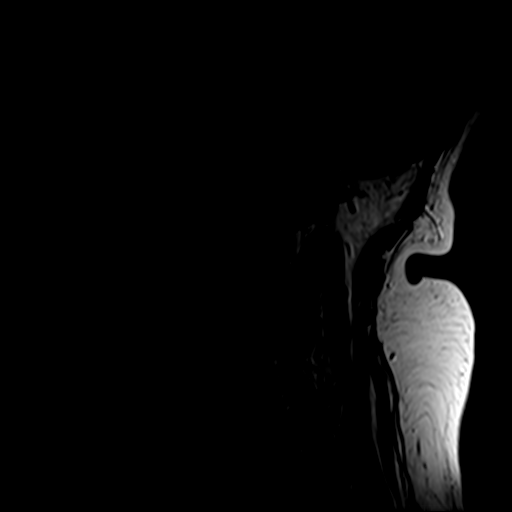
[im 13/15]
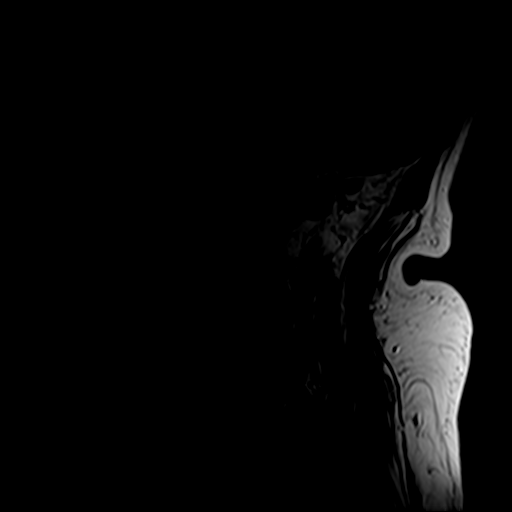
[im 15/15]
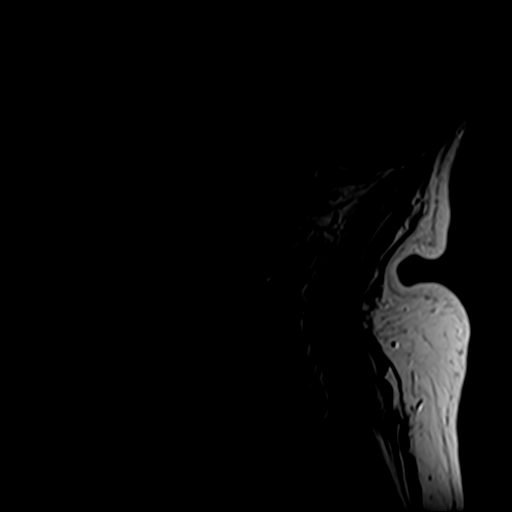

[Series 3: STIR · sagittal · 3.0mm · 0.82mm/px · 7 of 15 slices shown]
[im 1/15]
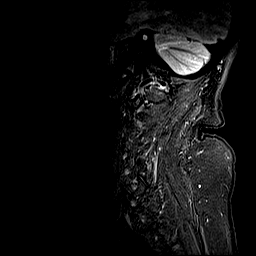
[im 3/15]
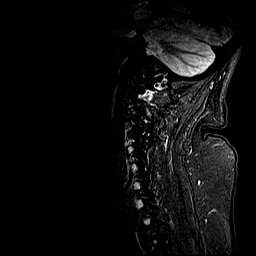
[im 5/15]
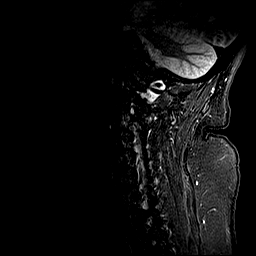
[im 8/15]
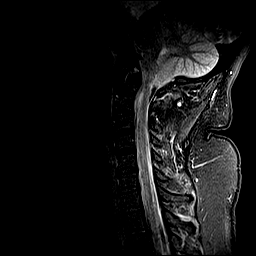
[im 10/15]
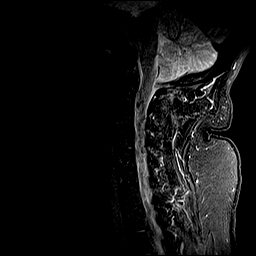
[im 12/15]
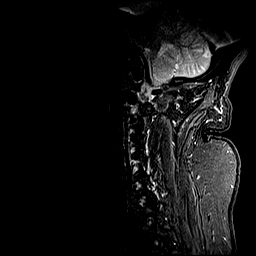
[im 15/15]
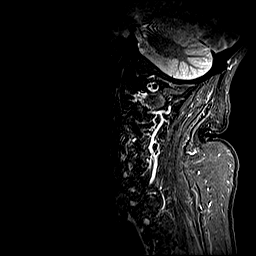

[Series 4: T1 · sagittal · 3.0mm · 0.82mm/px · 7 of 15 slices shown]
[im 1/15]
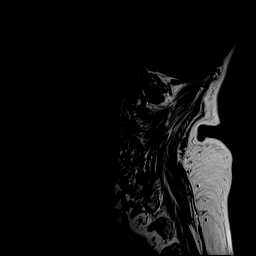
[im 3/15]
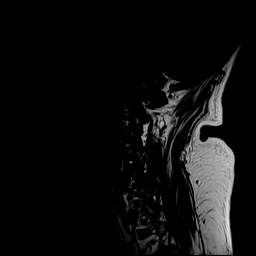
[im 5/15]
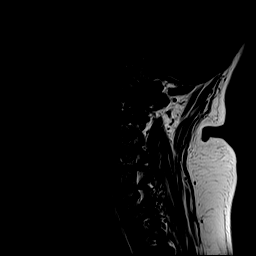
[im 8/15]
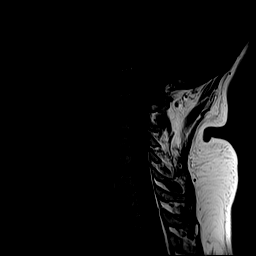
[im 10/15]
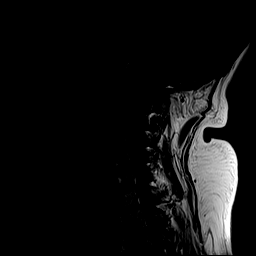
[im 12/15]
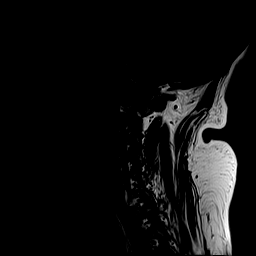
[im 15/15]
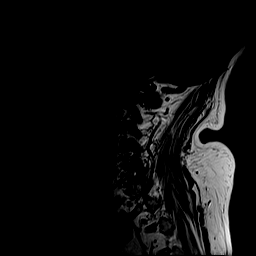

[Series 5: T2 · axial · 3.0mm · 0.70mm/px · z∈[-30,+61]mm · 9 of 26 slices shown (2 of 2)]
[im 1/26]
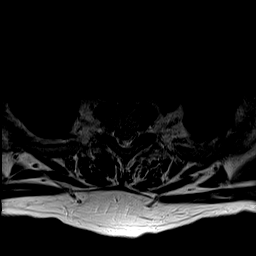
[im 5/26]
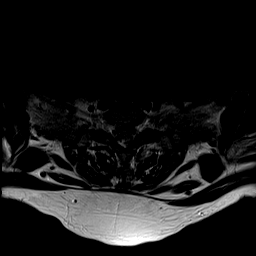
[im 9/26]
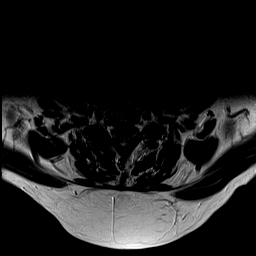
[im 11/26]
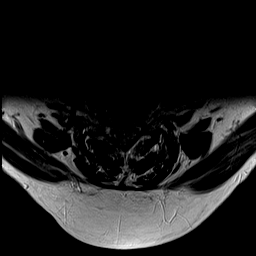
[im 13/26]
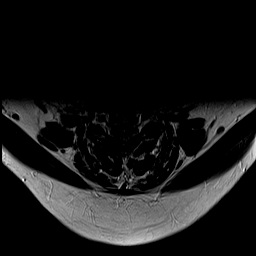
[im 15/26]
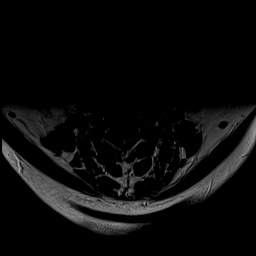
[im 17/26]
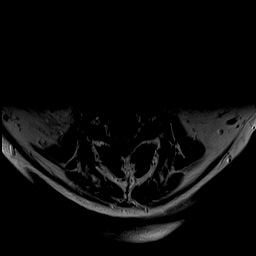
[im 21/26]
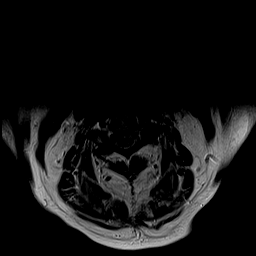
[im 26/26]
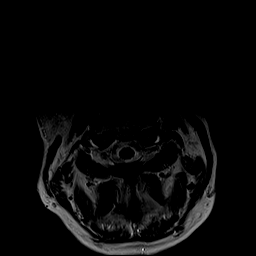

[Series 6: GRE · axial · 3.0mm · 0.35mm/px · z∈[-30,+6]mm · 4 of 26 slices shown]
[im 1/26]
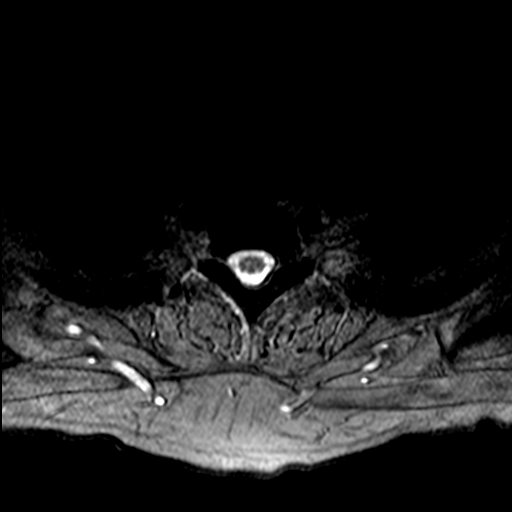
[im 5/26]
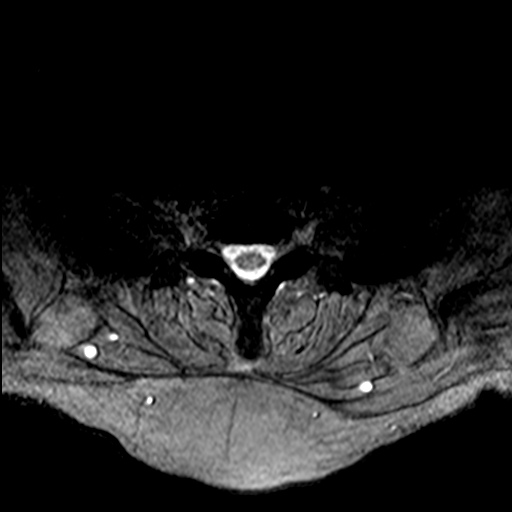
[im 9/26]
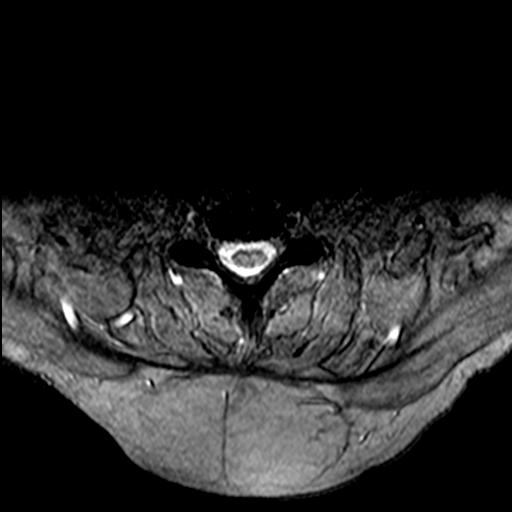
[im 11/26]
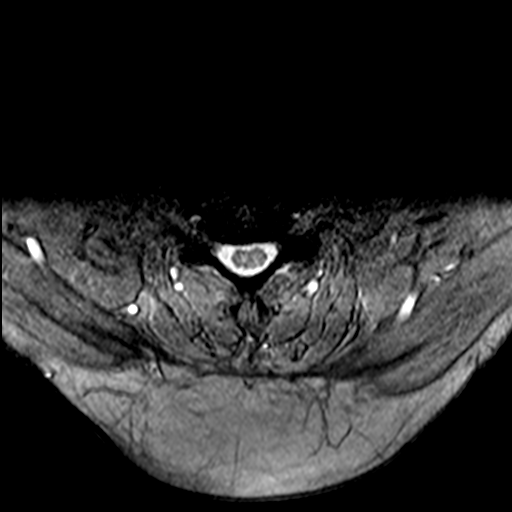

[35 of 48 positions shown; findings below may reference images not displayed]

FINDINGS: Alignment: Chronic straightening of cervical lordosis. No
spondylolisthesis.

Vertebrae: No marrow edema or evidence of acute osseous abnormality.
Visualized bone marrow signal is within normal limits.

Cord: Normal.  Spinal canal patency appears relatively normal.

Posterior Fossa, vertebral arteries, paraspinal tissues:
Cervicomedullary junction is within normal limits. Negative visible
posterior fossa. Major vascular flow voids in the neck are
preserved. Negative visible neck soft tissues and lung apices.

Disc levels:

C2-C3: Mild to moderate facet hypertrophy greater on the right. No
spinal stenosis. Mild to moderate right C3 foraminal stenosis.

C3-C4: Mild facet hypertrophy greater on the left. No spinal
stenosis. Mild left C4 foraminal stenosis.

C4-C5:  Minimal disc bulge.  Mild facet hypertrophy.  No stenosis.

C5-C6:  Minimal disc bulge.  No stenosis.

C6-C7: Small right paracentral disc protrusion best seen on series
6, image 20. But no associated stenosis.

C7-T1:  Mild facet hypertrophy.  No stenosis.

Negative visible upper thoracic levels.
IMPRESSION: 1. Mild for age cervical spine degeneration. No spinal stenosis or
acute osseous abnormality.
2. The dominant degenerative finding is facet hypertrophy, up to
moderate in the upper cervical spine and associated with mild to
moderate right C3 neural foraminal stenosis.

## 2022-05-03 ENCOUNTER — Encounter: Payer: Self-pay | Admitting: Endocrinology

## 2022-05-03 ENCOUNTER — Ambulatory Visit (INDEPENDENT_AMBULATORY_CARE_PROVIDER_SITE_OTHER): Payer: BC Managed Care – PPO | Admitting: Endocrinology

## 2022-05-03 VITALS — BP 142/76 | HR 84 | Ht 66.0 in | Wt 220.4 lb

## 2022-05-03 DIAGNOSIS — E782 Mixed hyperlipidemia: Secondary | ICD-10-CM | POA: Diagnosis not present

## 2022-05-03 DIAGNOSIS — E1165 Type 2 diabetes mellitus with hyperglycemia: Secondary | ICD-10-CM

## 2022-05-03 DIAGNOSIS — Z794 Long term (current) use of insulin: Secondary | ICD-10-CM | POA: Diagnosis not present

## 2022-05-03 MED ORDER — FREESTYLE LIBRE 3 SENSOR MISC: 1.0000 | 2 refills | Status: DC

## 2022-05-03 NOTE — Patient Instructions (Addendum)
Check blood sugars on waking up 3-4 days a week  Also check blood sugars about 2 hours after meals and do this after different meals by rotation  Recommended blood sugar levels on waking up are 90-130 and about 2 hours after meal is 130-180  Please bring your blood sugar monitor to each visit, thank you  Always take NovoLog 10 to 15 minutes before eating  Start with 8 units NovoLog before breakfast also and go up further if your sugars 2 hours later is a more than 180  Take 12 Novolog at supper  Add a protein like cheese, boiled egg or peanut butter at breakfast Eating cereal  LANTUS DO AT SUPPER with 20 units Go up 2 units on the Lantus every 3 days until your morning sugars are below at least 140

## 2022-05-03 NOTE — Progress Notes (Signed)
Patient ID: [name], female   DOB: 1960-11-11, 61 y.o.   MRN: [id number]           Reason for Appointment: Type II Diabetes follow-up   History of Present Illness   Diagnosis date: 2007 ?  Previous history:  Non-insulin hypoglycemic drugs previously used: Ozempic, metformin Insulin was started a few years after diagnosis  A1c range in the last few years is:6.0-9.9 and since 7/22 consistently higher  Recent history:     Non-insulin hypoglycemic drugs: Metformin 4944 mg a day, Trulicity 4.5 mg weekly     Insulin regimen:  Lantus 16 units in the morning Novolog 15   units at bedtime    Side effects from medications: Ozempic: nausea  Current self management, blood sugar patterns and problems identified:  A1c is 8.4 compared to 8.8 She says she was on a higher dose of Lantus previously and now taking only 16 units  She usually fairly regular with taking this in the mornings; also currently has a large supply of about 3 boxes at home  She has been on Trulicity for some time but in the first part of this year she had difficulty getting it consistently but now has been taking it regularly at least since This does help with satiety and has only occasional mild nausea Her weight is about the same  Exercise: She goes to the gym regularly and uses her exercise bike riding 5 miles are also doing resistance training for her arms   Diet management: Bfst cereal (cheerios) or eggs     Hypoglycemia: None recently, in the past she had a reading in the 60s once  Glucometer: Accu-Chek  Blood Glucose being checked very irregularly, she says she has difficulty using her meter also Reading by recall:  PRE-MEAL Fasting Lunch Dinner Bedtime Overall  Glucose range: 180      Mean/median:        POST-MEAL PC Breakfast PC Lunch PC Dinner  Glucose range:   180  Mean/median:       Dietician visit: Most recent: Years ago   Weight control:  Wt Readings from Last 3 Encounters:   05/03/22 220 lb 6.4 oz (100 kg)  04/28/22 222 lb 8 oz (100.9 kg)  04/21/22 225 lb (102.1 kg)            Diabetes labs:  Lab Results  Component Value Date   HGBA1C 8.4 (H) 04/29/2022   HGBA1C 8.8 (A) 02/02/2022   HGBA1C 8.7 (A) 12/03/2021   Lab Results  Component Value Date   MICROALBUR 2.0 (H) 04/29/2022   LDLCALC 84 03/31/2020   CREATININE 1.12 04/29/2022    No results found for: "FRUCTOSAMINE"   Allergies as of 05/03/2022       Reactions   Methotrexate Other (See Comments)   Peri-oral and buccal lesions   Vancomycin Other (See Comments)   DOSE RELATED NEPHROTOXICITY Other reaction(s): other   Lisinopril Cough   Other reaction(s): other   Chlorhexidine Itching   Clindamycin/lincomycin Nausea And Vomiting, Rash   Lincomycin Nausea Only, Rash   Other reaction(s): vomiting   Teflaro [ceftaroline] Rash, Other (See Comments)   Tolerates ceftriaxone         Medication List        Accurate as of May 03, 2022  2:26 PM. If you have any questions, ask your nurse or doctor.          STOP taking these medications    nitrofurantoin (macrocrystal-monohydrate) 100 MG capsule  Commonly known as: Macrobid Stopped by: Elayne Snare, MD       TAKE these medications    albuterol (2.5 MG/3ML) 0.083% nebulizer solution Commonly known as: PROVENTIL USE 1 VIAL VIA NEBULIZXER EVERY 6 HOURS AS NEEDED FOR WHEEZING OR SHORTNESS OF BREATH   albuterol 108 (90 Base) MCG/ACT inhaler Commonly known as: VENTOLIN HFA Inhale 1-2 puffs into the lungs every 6 (six) hours as needed for wheezing or shortness of breath.   allopurinol 300 MG tablet Commonly known as: ZYLOPRIM TAKE 1 TABLET BY MOUTH EVERY DAY   amoxicillin-clavulanate 875-125 MG tablet Commonly known as: AUGMENTIN Take 1 tablet by mouth 2 (two) times daily.   aspirin EC 81 MG tablet Take 1 tablet (81 mg total) by mouth daily.   atorvastatin 40 MG tablet Commonly known as: LIPITOR TAKE 1 TABLET BY MOUTH EVERY  DAY   benzonatate 100 MG capsule Commonly known as: Tessalon Perles Take 1 capsule (100 mg total) by mouth 3 (three) times daily as needed for cough.   Breo Ellipta 200-25 MCG/ACT Aepb Generic drug: fluticasone furoate-vilanterol TAKE 1 PUFF BY MOUTH EVERY DAY   buPROPion 300 MG 24 hr tablet Commonly known as: WELLBUTRIN XL TAKE 1 TABLET BY MOUTH EVERY DAY   carvedilol 25 MG tablet Commonly known as: COREG Take 1 tablet by mouth 2 (two) times daily with a meal.   Cholecalciferol 25 MCG (1000 UT) capsule Take 1,000 Units by mouth in the morning and at bedtime.   Contour Next Test test strip Generic drug: glucose blood 1 each by Other route 4 (four) times daily. And lancets 4/day   diclofenac Sodium 1 % Gel Commonly known as: VOLTAREN Apply 1 application topically as needed (pain.).   diltiazem 120 MG 24 hr capsule Commonly known as: CARDIZEM CD TAKE 1 CAPSULE BY MOUTH EVERY DAY   Easy Mini Eject Lancing Device Misc Use as directed 4 times daily to test blood sugar   famotidine 20 MG tablet Commonly known as: Pepcid Take 1 tablet (20 mg total) by mouth at bedtime.   fenofibrate 160 MG tablet TAKE 1 TABLET BY MOUTH EVERY DAY   fluticasone 50 MCG/ACT nasal spray Commonly known as: FLONASE Place 2 sprays into both nostrils daily as needed for allergies.   FreeStyle Libre 3 Sensor Misc 1 Device by Does not apply route every 14 (fourteen) days. Apply 1 sensor on upper arm every 14 days for continuous glucose monitoring Started by: Elayne Snare, MD   furosemide 40 MG tablet Commonly known as: LASIX TAKE 1 TABLET BY MOUTH TWICE A DAY   gabapentin 100 MG capsule Commonly known as: NEURONTIN TAKE 1 CAPSULE BY MOUTH THREE TIMES A DAY   gabapentin 300 MG capsule Commonly known as: NEURONTIN Take at night in addition to the '100mg'$  gabapentin to get '400mg'$  at night and '100mg'$  in the morning and at mid day.   HYDROcodone-acetaminophen 5-325 MG tablet Commonly known as:  NORCO/VICODIN Take 1 tablet by mouth every 6 (six) hours as needed.   ipratropium 0.03 % nasal spray Commonly known as: ATROVENT PLACE 2 SPRAYS INTO BOTH NOSTRILS EVERY 12 (TWELVE) HOURS.   Klor-Con M20 20 MEQ tablet Generic drug: potassium chloride SA TAKE 1 & 1/2 TABLETS BY MOUTH TWICE A DAY   lactulose 10 GM/15ML solution Commonly known as: CHRONULAC TAKE 15 MLS BY MOUTH DAILY AS NEEDED FOR CONSTIPATION.   Lantus SoloStar 100 UNIT/ML Solostar Pen Generic drug: insulin glargine Inject 16 Units into the skin every morning. What  changed: Another medication with the same name was removed. Continue taking this medication, and follow the directions you see here. Changed by: Elayne Snare, MD   Linzess 290 MCG Caps capsule Generic drug: linaclotide TAKE 1 CAPSULE BY MOUTH DAILY BEFORE BREAKFAST.   LORazepam 0.5 MG tablet Commonly known as: ATIVAN Take 0.5 mg by mouth every 12 (twelve) hours as needed for anxiety.   meloxicam 15 MG tablet Commonly known as: MOBIC Take 1 tablet (15 mg total) by mouth daily.   metFORMIN 500 MG 24 hr tablet Commonly known as: GLUCOPHAGE-XR Take 4 tablets (2,000 mg total) by mouth daily with breakfast.   metoCLOPramide 10 MG tablet Commonly known as: REGLAN Take 1 tablet (10 mg total) by mouth every 6 (six) hours.   Microlet Lancets Misc 1 each by Does not apply route 2 (two) times daily. E11.9   nitroGLYCERIN 0.4 MG SL tablet Commonly known as: NITROSTAT Place 1 tablet (0.4 mg total) under the tongue every 5 (five) minutes as needed for chest pain. Reported on 01/05/2016   NovoLOG FlexPen 100 UNIT/ML FlexPen Generic drug: insulin aspart Inject 15 Units into the skin daily with supper.   nystatin 100000 UNIT/ML suspension Commonly known as: MYCOSTATIN nystatin 100,000 unit/mL oral suspension  SWISH, GARGLE, AND SPIT 20 ML 4 TIMES A DAY UNTIL RESOLVED   ondansetron 4 MG disintegrating tablet Commonly known as: ZOFRAN-ODT Take 1 tablet (4  mg total) by mouth every 8 (eight) hours as needed for nausea or vomiting.   oxyCODONE-acetaminophen 5-325 MG tablet Commonly known as: PERCOCET/ROXICET Take 1 tablet by mouth every 4 (four) hours as needed.   pantoprazole 40 MG tablet Commonly known as: PROTONIX Take 1 tablet (40 mg total) by mouth daily.   Pen Needles 32G X 4 MM Misc 1 each by Does not apply route 4 (four) times daily. E11.9   phenazopyridine 100 MG tablet Commonly known as: Pyridium Take 1 tablet (100 mg total) by mouth 3 (three) times daily as needed for pain.   telmisartan 20 MG tablet Commonly known as: MICARDIS TAKE 1 TABLET BY MOUTH EVERY DAY   traMADol 50 MG tablet Commonly known as: ULTRAM TAKE 1 TABLET BY MOUTH EVERY 12 HOURS AS NEEDED FOR MODERATE TO SEVERE PAIN   triamcinolone cream 0.1 % Commonly known as: KENALOG APPLY TOPICALLY TO RASH 2 TIMES DAILY AS NEEDED. STOP USE AFTER 1-93 DAYS   Trulicity 4.5 XT/0.2IO Sopn Generic drug: Dulaglutide Inject 4.5 mg as directed once a week.   venlafaxine XR 75 MG 24 hr capsule Commonly known as: EFFEXOR-XR TAKE 1 CAPSULE BY MOUTH TWICE A DAY        Allergies:  Allergies  Allergen Reactions   Methotrexate Other (See Comments)    Peri-oral and buccal lesions   Vancomycin Other (See Comments)    DOSE RELATED NEPHROTOXICITY Other reaction(s): other   Lisinopril Cough    Other reaction(s): other   Chlorhexidine Itching   Clindamycin/Lincomycin Nausea And Vomiting and Rash   Lincomycin Nausea Only and Rash    Other reaction(s): vomiting   Teflaro [Ceftaroline] Rash and Other (See Comments)    Tolerates ceftriaxone     Past Medical History:  Diagnosis Date   Abnormal SPEP 04/17/2014   Acute left ankle pain 01/26/2017   ANEMIA-UNSPECIFIED 09/18/2009   Anxiety    Arthritis    Blood transfusion without reported diagnosis    Cataract    CHF (congestive heart failure) (HCC)    Chronic diastolic heart failure, NYHA class  2 ([place])    Normal LVEDP  by May 2018   COPD (chronic obstructive pulmonary disease) ([place])    Depression    DIABETES MELLITUS, TYPE II 08/21/2006   Diabetic osteomyelitis ([place]) 05/29/2015   Fatty liver    Fracture of 5th metatarsal    non union   GERD 32/67/1245   Had fundoplication   GOUT 80/99/8338   Hammer toe of right foot    2-5th toes   Hx of umbilical hernia repair    HYPERLIPIDEMIA 08/21/2006   HYPERTENSION 08/21/2006   Infection of wound due to methicillin resistant Staphylococcus aureus (MRSA)    Internal hemorrhoids    Kidney problem    Multiple allergies 10/14/2016   OBESITY 06/04/2009   Onychomycosis 10/27/2015   Osteomyelitis of left foot ([place]) 05/29/2015   Osteoporosis    Pulmonary sarcoidosis ([place])    Followed locally by pulmonology, but also by Dr. Casper Harrison at [facility]   Right knee pain 01/26/2017   Vocal cord dysfunction    Wears partial dentures     Past Surgical History:  Procedure Laterality Date   ABDOMINAL HYSTERECTOMY     APPENDECTOMY     BLADDER SUSPENSION  11/11/2011   Procedure: TRANSVAGINAL TAPE (TVT) PROCEDURE;  Surgeon: Olga Millers, MD;  Location: [facility];  Service: Gynecology;  Laterality: N/A;   CAROTIDS  02/18/2011   CAROTID DUPLEX; VERTEBRALS ARE PATENT WITH ANTEGRADE FLOW. ICA/CCA RATIO 1.61 ON RIGHT AND 0.75 ON LEFT   CATARACT EXTRACTION, BILATERAL     CHOLECYSTECTOMY  1984   COLONOSCOPY  04/29/2010   [name]   CYSTOSCOPY  11/11/2011   Procedure: CYSTOSCOPY;  Surgeon: Olga Millers, MD;  Location: [facility];  Service: Gynecology;  Laterality: N/A;   EXTUBATION (ENDOTRACHEAL) IN OR N/A 09/23/2017   Procedure: EXTUBATION (ENDOTRACHEAL) IN OR;  Surgeon: Helayne Seminole, MD;  Location: [place];  Service: ENT;  Laterality: N/A;   FIBEROPTIC LARYNGOSCOPY AND TRACHEOSCOPY N/A 09/23/2017   Procedure: FLEXIBLE FIBEROPTIC LARYNGOSCOPY;  Surgeon: Helayne Seminole, MD;  Location: [place];  Service: ENT;  Laterality: N/A;   FRACTURE SURGERY     foot   [place] Left 10/02/2018   Procedure: HALLUX ARTHRODESIS;  Surgeon: Edrick Kins, DPM;  Location: [facility];  Service: Podiatry;  Laterality: Left;   HAMMER TOE SURGERY Left 10/02/2018   Procedure: HAMMER TOE CORRECTION 2ND 3RD 4RD FIFTH TOE;  Surgeon: Edrick Kins, DPM;  Location: [facility];  Service: Podiatry;  Laterality: Left;   HAMMER TOE SURGERY Right 04/12/2019   Procedure: HAMMER TOE CORRECTION, 2ND, 3RD, 4TH AND 5TH TOES OF RIGHT FOOT;  Surgeon: Edrick Kins, DPM;  Location: [place];  Service: Podiatry;  Laterality: Right;   HAMMER TOE SURGERY Left 10/25/2019   Procedure: HAMMER TOE REPAIR SECOND, THIRD, FOUTH;  Surgeon: Edrick Kins, DPM;  Location: [facility];  Service: Podiatry;  Laterality: Left;   HERNIA REPAIR     I & D EXTREMITY Left 06/27/2015   Procedure: Partial Excision Left Calcaneus, Place Antibiotic Beads, and Wound VAC;  Surgeon: Newt Minion, MD;  Location: [place];  Service: Orthopedics;  Laterality: Left;   JOINT REPLACEMENT     KNEE ARTHROSCOPY     right   LEFT AND RIGHT HEART CATHETERIZATION WITH CORONARY ANGIOGRAM N/A 04/23/2013   Procedure: LEFT AND RIGHT HEART CATHETERIZATION WITH CORONARY ANGIOGRAM;  Surgeon: Leonie Man, MD;  Location: [facility];  Service: Cardiovascular;  Laterality: N/A;  LEFT HEART CATH AND CORONARY ANGIOGRAPHY N/A 03/11/2017   Procedure: Left Heart Cath and Coronary Angiography;  Surgeon: Sherren Mocha, MD;  Location: [place] CV LAB; angiographically minimal CAD in the LAD otherwise normal.;  Normal LVEDP.  FALSE POSITIVE MYOVIEW   LEFT HEART CATH AND CORONARY ANGIOGRAPHY  07/20/2010   LVEF 50-55% WITH VERY MILD GLOBAL HYPOKINESIA; ESSENTIALLY NORMAL CORONARY ARTERIES; NORMAL LV FUNCTION   METATARSAL OSTEOTOMY WITH OPEN REDUCTION INTERNAL FIXATION (ORIF) METATARSAL WITH FUSION Left 04/09/2014   Procedure: LEFT FOOT FRACTURE OPEN TREATMENT METATARSAL INCLUDES INTERNAL FIXATION EACH;  Surgeon: Lorn Junes, MD;  Location: [place];  Service: Orthopedics;  Laterality: Left;   NISSEN FUNDOPLICATION  4098   NM MYOVIEW LTD --> FALSE POSITIVE  03/10/2017   : Moderate size "stress-induced "perfusion defect at the apex as well as "ill-defined stress-induced perfusion defect in the lateral wall.  EF 55%.  INTERMEDIATE risk. -->  FALSE POSITIVE   ORIF DISTAL FEMUR FRACTURE  08/08/2020   [facility]: ORIF of left extra-articular periprosthetic distal femur fracture with synthesis of the femur plate with cortical nonlocking screws.  (Thought to be pathologic fracture due to osteoporosis) -> after a fall   ORIF FEMUR W/ PERI-IMPLANT  08/29/2020   [facility]) revision of left femur fracture ORIF plate fixation screws; culture positive for MSSA   Right and left CARDIAC CATHETERIZATION  04/23/2013   Angiographic normal coronaries; LVEDP 20 mmHg, PCWP 12-14 mmHg, RAP 12 mmHg.; Fick CO/CI 4.9/2.2   ROTATOR CUFF REPAIR Right 12/09/2021   SHOULDER ARTHROSCOPY Left 03/14/2019   Procedure: LEFT SHOULDER ARTHROSCOPY, DEBRIDEMENT, AND DECOMPRESSION;  Surgeon: Newt Minion, MD;  Location: [place];  Service: Orthopedics;  Laterality: Left;   TOTAL KNEE ARTHROPLASTY Right 06/29/2017   Procedure: RIGHT TOTAL KNEE ARTHROPLASTY;  Surgeon: Newt Minion, MD;  Location: [place];  Service: Orthopedics;  Laterality: Right;   TOTAL KNEE ARTHROPLASTY Left 12/07/2017   TOTAL KNEE ARTHROPLASTY Left 12/07/2017   Procedure: LEFT TOTAL KNEE ARTHROPLASTY;  Surgeon: Newt Minion, MD;  Location: [place];  Service: Orthopedics;  Laterality: Left;   TRACHEOSTOMY TUBE PLACEMENT N/A 09/20/2017   Procedure: AWAKE INTUBATION WITH ANESTHESIA WITH VIDEO ASSISTANCE;  Surgeon: Helayne Seminole, MD;  Location: [facility];  Service: ENT;  Laterality: N/A;   TRANSTHORACIC ECHOCARDIOGRAM  08/2014   Normal LV size and function.  Mild LVH.  EF 55-60%.  Normal regional wall motion.  GR 1 DD.  Normal RV size and function .   TRANSTHORACIC  ECHOCARDIOGRAM  08/12/2020    [facility]): Normal LV size and function.  EF 55 to 60%.  No RWM A.  Normal filling pattern.  Normal RV.  No significant valvular disease-stenosis or regurgitation.  No vegetation.   TUBAL LIGATION     with reversal in [name]      Family History  Problem Relation Age of Onset   Diabetes Father    Heart attack Father 63   Coronary artery disease Father    Heart failure Father    Throat cancer Father    Hypertension Father    Heart disease Father    Sleep apnea Father    Obesity Father    COPD Mother    Emphysema Mother    Asthma Mother    Heart failure Mother    Breast cancer Mother    Diabetes Mother  Kidney disease Mother    Thyroid disease Mother    Heart attack Maternal Grandfather    Sarcoidosis Maternal Uncle    Lung cancer Brother    Diabetes Brother    Colon cancer Neg Hx    Colon polyps Neg Hx    Esophageal cancer Neg Hx    Rectal cancer Neg Hx    Stomach cancer Neg Hx     Social History:  reports that she has never smoked. She has never used smokeless tobacco. She reports that she does not drink alcohol and does not use drugs.  Review of Systems  Gastrointestinal:  Positive for nausea.       Reflux     Last diabetic eye exam date 03/29/2022  Last foot exam date: 10/22  Symptoms of neuropathy: She has had some burning and pain in her feet and legs, generally controlled with gabapentin at bedtime   Hypertension:   Treatment includes Micardis 20 mg  BP Readings from Last 3 Encounters:  05/03/22 (!) 142/76  04/28/22 130/62  04/21/22 138/78    Lipid management: This is done by her PCP with Lipitor and fenofibrate    Lab Results  Component Value Date   CHOL 164 07/21/2021   CHOL 162 03/31/2020   CHOL 143 01/22/2020   Lab Results  Component Value Date   HDL 49.00 07/21/2021   HDL 40 03/31/2020   HDL 31.60 (L) 01/22/2020   Lab Results   Component Value Date   LDLCALC 84 03/31/2020   LDLCALC 49 03/10/2017   LDLCALC UNABLE TO CALCULATE IF TRIGLYCERIDE OVER 400 mg/dL 05/28/2011   Lab Results  Component Value Date   TRIG 260.0 (H) 07/21/2021   TRIG 227 (H) 03/31/2020   TRIG 293.0 (H) 01/22/2020   Lab Results  Component Value Date   CHOLHDL 3 07/21/2021   CHOLHDL 5 01/22/2020   CHOLHDL 5 07/04/2019   Lab Results  Component Value Date   LDLDIRECT 84.0 07/21/2021   LDLDIRECT 68.0 01/22/2020   LDLDIRECT 86.0 07/04/2019     Examination:   BP (!) 142/76 (BP Location: Left Arm, Patient Position: Sitting, Cuff Size: Normal)   Pulse 84   Ht '5\' 6"'$  (1.676 m)   Wt 220 lb 6.4 oz (100 kg)   SpO2 97%   BMI 35.57 kg/m   Body mass index is 35.57 kg/m.    ASSESSMENT/ PLAN:    Diabetes type 2 with obesity, hypertension and hyperlipidemia:   Current regimen: Once a day Lantus and NovoLog, Trulicity 4.5 mg and metformin  See history of present illness for detailed discussion of current diabetes management, blood sugar patterns and problems identified  A1c is 8.4  Blood glucose control has been consistently poor Current glucose monitoring is inadequate and she is likely to be benefiting from a continuous glucose sensor also She is using NovoLog inappropriately late at night instead of before supper Also likely needs mealtime coverage to at least breakfast   Recommendations:  Change LANTUS to suppertime instead of morning and start with 20 units She will go up every 3 days x 2 units until her morning sugars are below 140 Change NOVOLOG to suppertime and go down to 12 units for now, if her blood sugars 2 hours after eating are still over 180 she will go 2 to 4 units Start NOVOLOG 8 units before breakfast daily Increase by 2 to 4 units if blood sugars are consistently higher 2 hours after eating Blood sugars will be monitored by the  freestyle libre sensor A sample of the libre 3 sensor was applied to her upper arm  by the nurse educator and she was instructed on how to use this Discussed blood sugar targets as above for various times Started having more protein at breakfast especially when eating cereal Consultation with diabetes educator/nutritionist Discussed need for weight loss and long-term control of A1c below 7 Needs fasting lipids and if triglycerides consistently high may consider Vascepa  Patient Instructions  Check blood sugars on waking up 3-4 days a week  Also check blood sugars about 2 hours after meals and do this after different meals by rotation  Recommended blood sugar levels on waking up are 90-130 and about 2 hours after meal is 130-180  Please bring your blood sugar monitor to each visit, thank you  Always take NovoLog 10 to 15 minutes before eating  Start with 8 units NovoLog before breakfast also and go up further if your sugars 2 hours later is a more than 180  Take 12 Novolog at supper  Add a protein like cheese, boiled egg or peanut butter at breakfast Eating cereal  LANTUS DO AT SUPPER with 20 units Go up 2 units on the Lantus every 3 days until your morning sugars are below at least 140   [name] 05/03/2022, 2:26 PM

## 2022-05-04 ENCOUNTER — Telehealth: Payer: Self-pay | Admitting: Nutrition

## 2022-05-04 NOTE — Telephone Encounter (Signed)
Patient was trained on the use of the [place] 3.  The app was downloaded to her phone the app was linked to [place].  We were not able to start the sensor, and she was told to call the help line for this when she gets home.  Her husband agreed to do this.  Telephone number given to call for assistance

## 2022-05-05 ENCOUNTER — Other Ambulatory Visit: Payer: Self-pay

## 2022-05-05 DIAGNOSIS — E1165 Type 2 diabetes mellitus with hyperglycemia: Secondary | ICD-10-CM

## 2022-05-05 MED ORDER — METFORMIN HCL ER 500 MG PO TB24: 2000.0000 mg | ORAL_TABLET | Freq: Every day | ORAL | 3 refills | Status: DC

## 2022-05-18 IMAGING — MG MM DIGITAL SCREENING BILAT W/ TOMO AND CAD
6 of 10 series · 6 of 30 positions shown · non-contrast
Comparison: Previous exam(s).

CLINICAL DATA: Screening.

EXAM:
DIGITAL SCREENING BILATERAL MAMMOGRAM WITH TOMOSYNTHESIS AND CAD
TECHNIQUE: Bilateral screening digital craniocaudal and mediolateral oblique
mammograms were obtained. Bilateral screening digital breast
tomosynthesis was performed. The images were evaluated with
computer-aided detection.

[R CC synth-2D (1 of 2)]
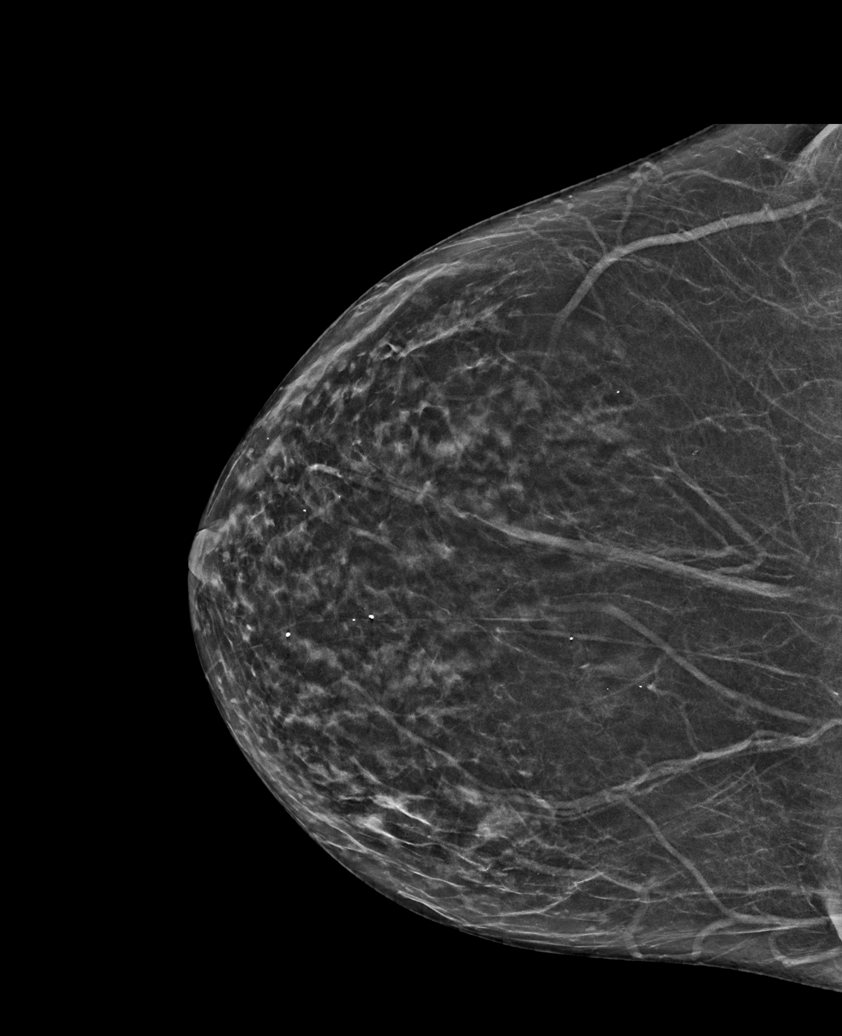

[L MLO synth-2D]
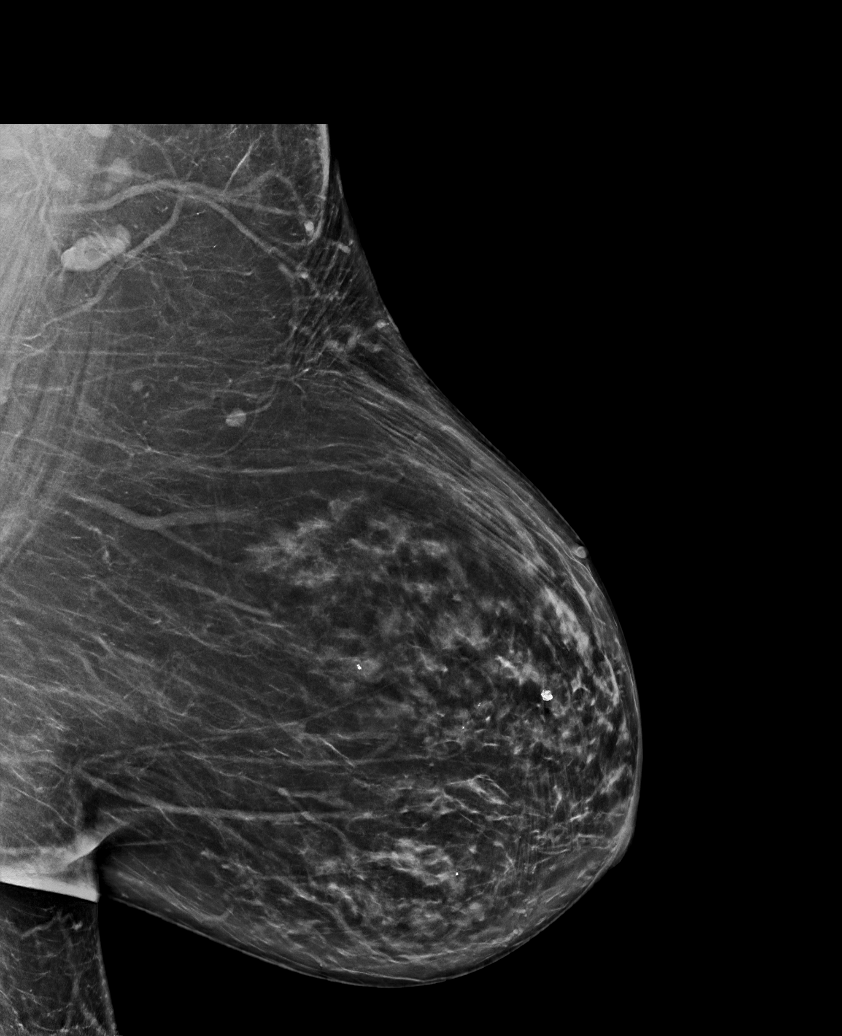

[R CC synth-2D (2 of 2)]
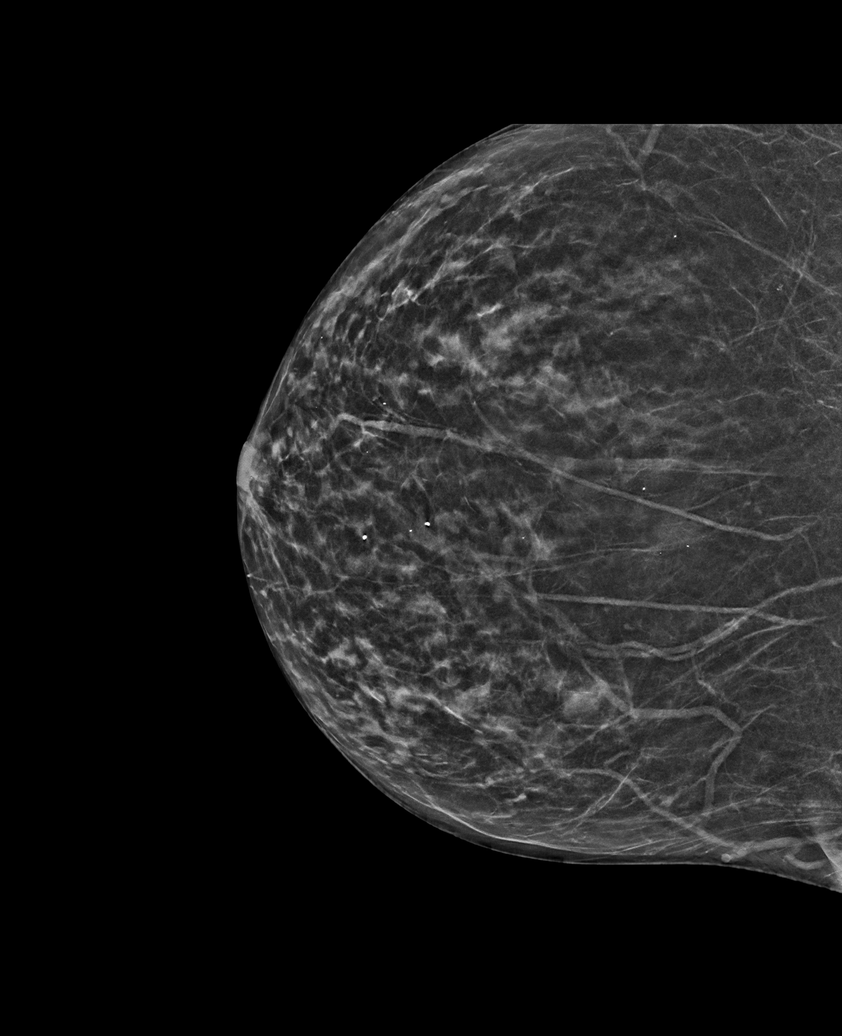

[L CC synth-2D]
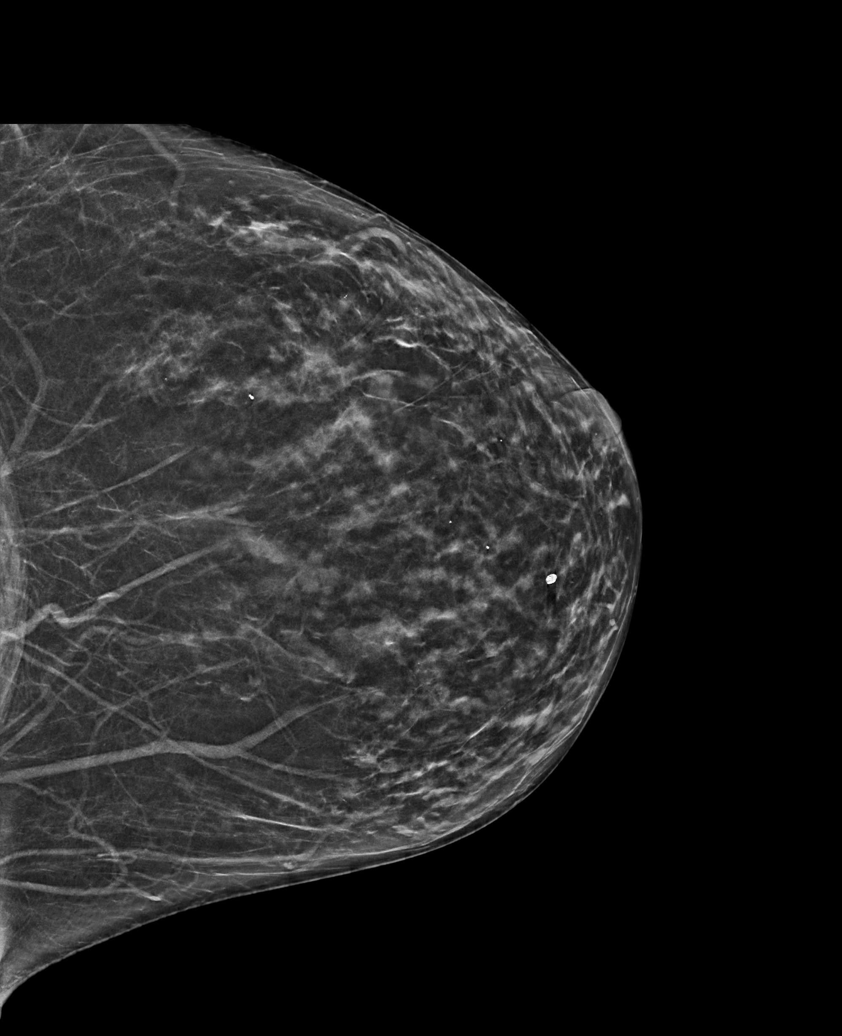

[R MLO synth-2D]
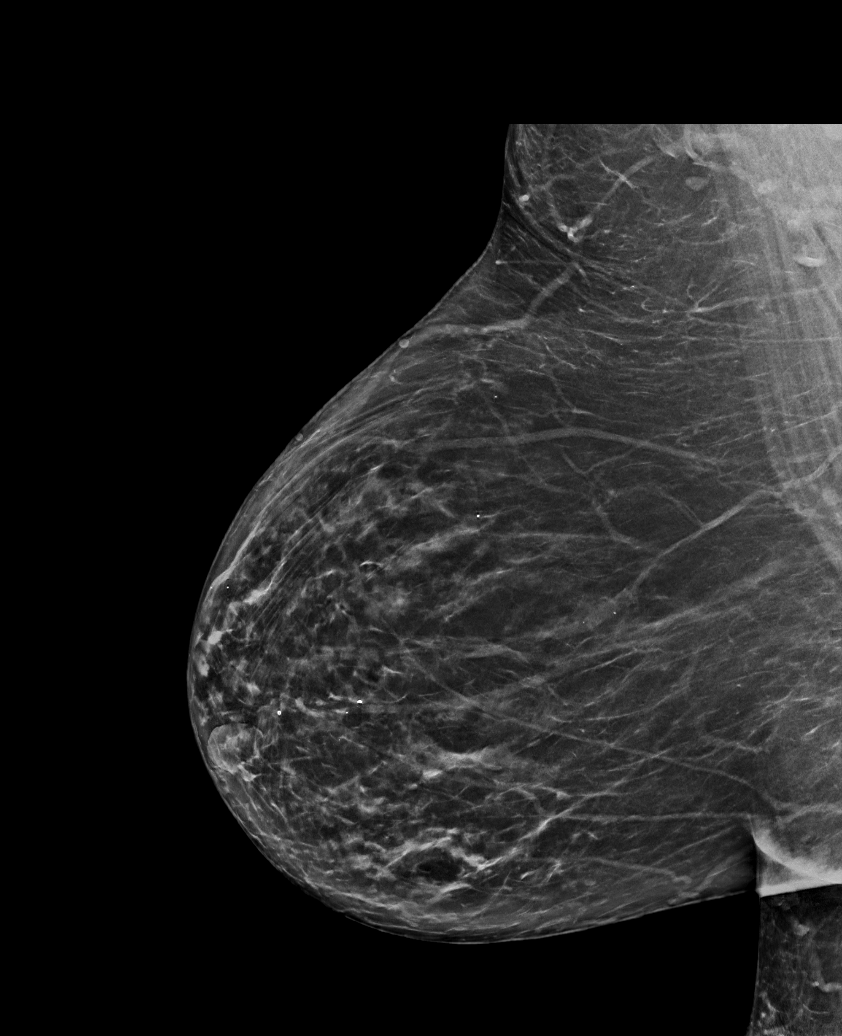

[R CC tomo · tomo slice 29/58.0]
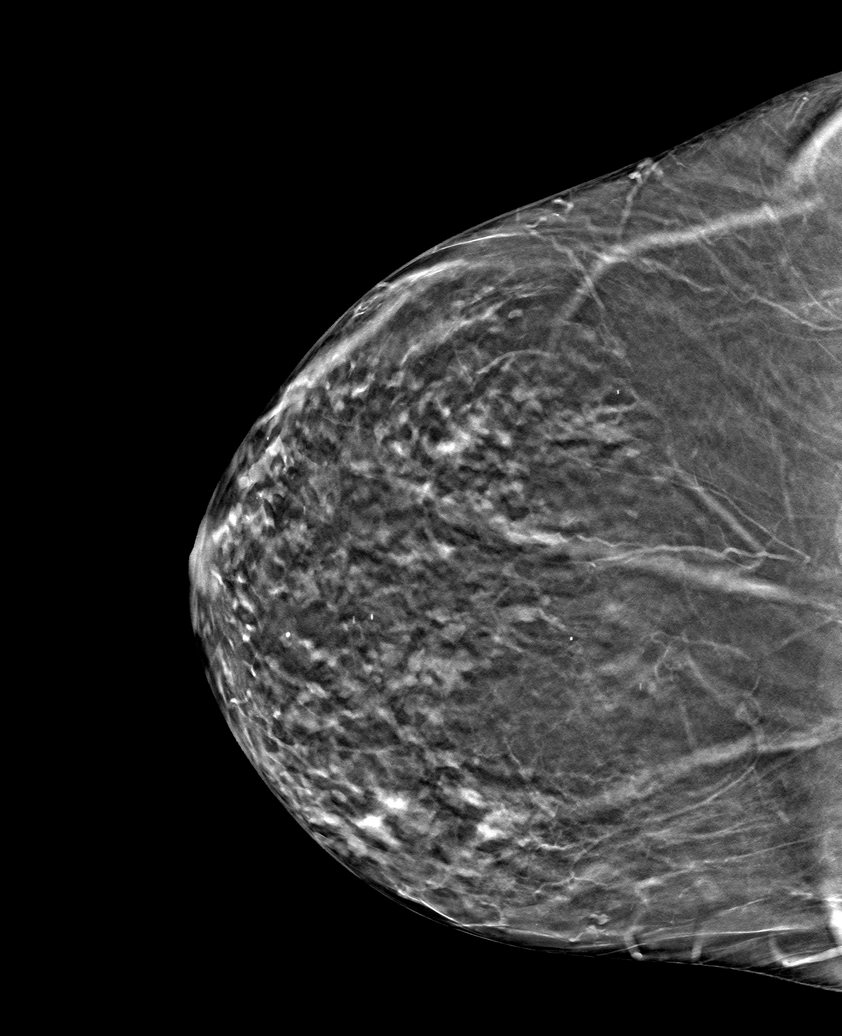

[6 of 30 positions shown; findings below may reference images not displayed]

ACR Breast Density Category b: There are scattered areas of
fibroglandular density.
FINDINGS: There are no findings suspicious for malignancy.
IMPRESSION: No mammographic evidence of malignancy. A result letter of this
screening mammogram will be mailed directly to the patient.

RECOMMENDATION:
Screening mammogram in one year. (Code:51-O-LD2)

BI-RADS CATEGORY  1: Negative.

## 2022-05-19 ENCOUNTER — Encounter (INDEPENDENT_AMBULATORY_CARE_PROVIDER_SITE_OTHER): Payer: Self-pay

## 2022-05-24 ENCOUNTER — Ambulatory Visit (INDEPENDENT_AMBULATORY_CARE_PROVIDER_SITE_OTHER): Payer: BC Managed Care – PPO | Admitting: Family

## 2022-05-24 ENCOUNTER — Ambulatory Visit: Payer: BC Managed Care – PPO | Admitting: Family Medicine

## 2022-05-24 ENCOUNTER — Encounter: Payer: Self-pay | Admitting: Family

## 2022-05-24 VITALS — BP 140/85 | HR 87 | Temp 98.3°F | Ht 66.0 in | Wt 222.2 lb

## 2022-05-24 DIAGNOSIS — R059 Cough, unspecified: Secondary | ICD-10-CM | POA: Diagnosis not present

## 2022-05-24 DIAGNOSIS — J45901 Unspecified asthma with (acute) exacerbation: Secondary | ICD-10-CM | POA: Diagnosis not present

## 2022-05-24 DIAGNOSIS — J441 Chronic obstructive pulmonary disease with (acute) exacerbation: Secondary | ICD-10-CM | POA: Diagnosis not present

## 2022-05-24 LAB — POC COVID19 BINAXNOW: SARS Coronavirus 2 Ag: NEGATIVE

## 2022-05-24 IMAGING — MR MR HEAD W/O CM
10 series · 48 of 48 positions shown · non-contrast
Comparison: CT from 07/27/2018.
COMPARISON: CT from 07/27/2018.

Addendum:
CLINICAL DATA: Initial evaluation for neuro deficit, stroke
suspected, headaches.

EXAM:
MRI HEAD WITHOUT CONTRAST
TECHNIQUE: Multiplanar, multiecho pulse sequences of the brain and surrounding
structures were obtained without intravenous contrast.

[Series 11: DWI · axial · 3.0mm · 1.25mm/px · z∈[-66,+74]mm · 9 of 96 slices shown (1 of 2)]
[im 1/96]
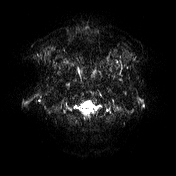
[im 12/96]
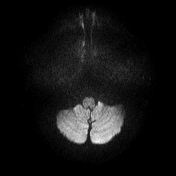
[im 24/96]
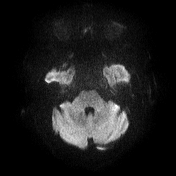
[im 36/96]
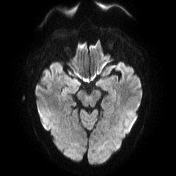
[im 48/96]
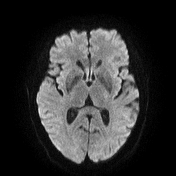
[im 60/96]
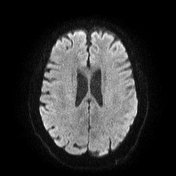
[im 72/96]
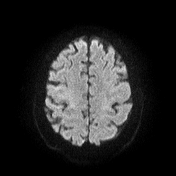
[im 84/96]
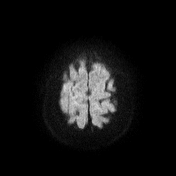
[im 96/96]
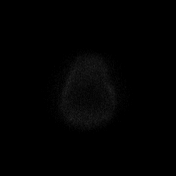

[Series 12: DWI · axial · 3.0mm · 1.25mm/px · z∈[-66,+74]mm · 4 of 48 slices shown (2 of 2)]
[im 1/48]
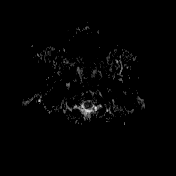
[im 16/48]
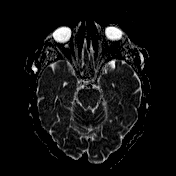
[im 32/48]
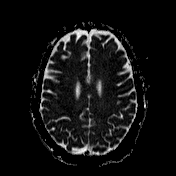
[im 48/48]
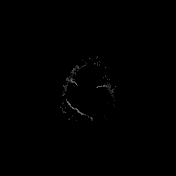

[Series 13: T1 · sagittal · 5.0mm · 0.72mm/px · 2 of 24 slices shown (1 of 2)]
[im 1/24]
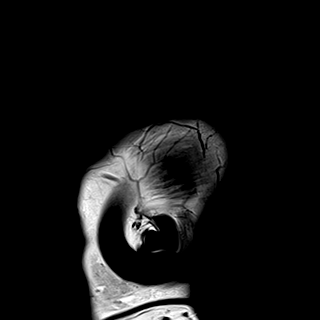
[im 24/24]
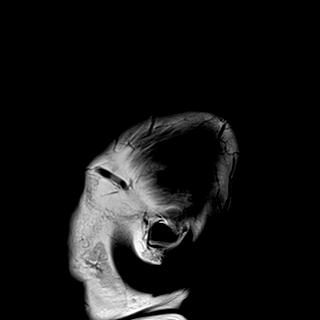

[Series 14: T2 · axial · 5.0mm · 0.60mm/px · z∈[-76,+85]mm · 2 of 26 slices shown (1 of 2)]
[im 1/26]
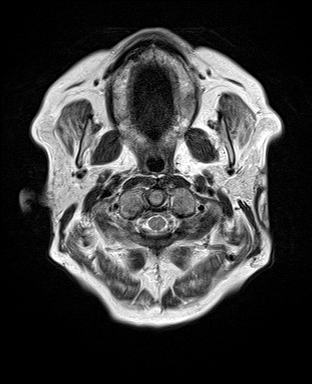
[im 26/26]
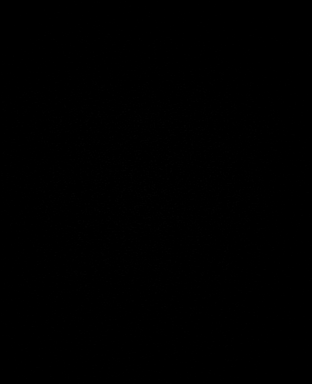

[Series 15: swi_images · axial · 3.0mm · 0.72mm/px · z∈[-84,+92]mm · 5 of 60 slices shown]
[im 1/60]
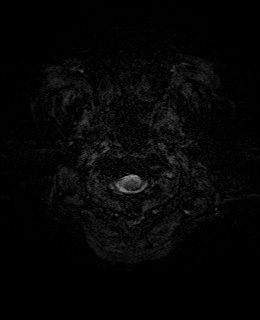
[im 15/60]
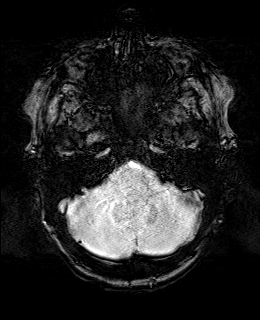
[im 30/60]
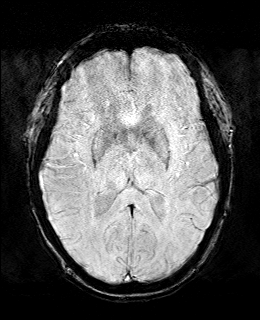
[im 45/60]
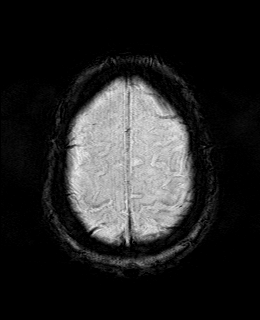
[im 60/60]
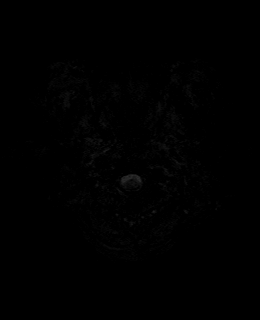

[Series 17: FLAIR · axial · 3.0mm · 0.72mm/px · z∈[-72,+80]mm · 4 of 52 slices shown]
[im 1/52]
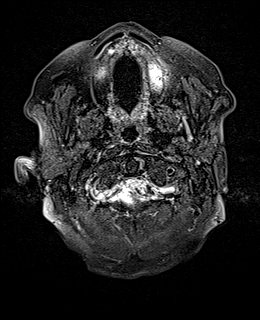
[im 18/52]
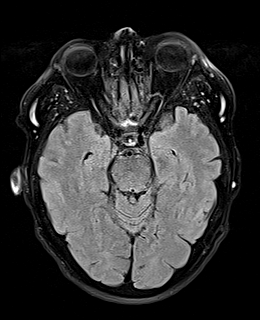
[im 35/52]
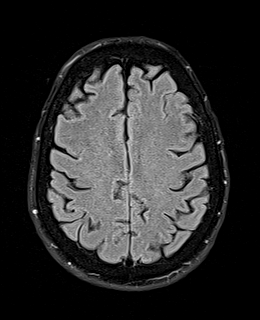
[im 52/52]
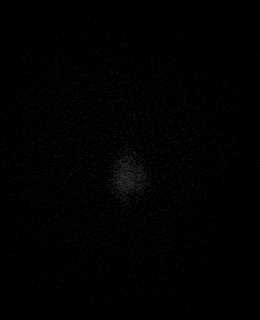

[Series 18: T1 · axial · 1.0mm · 0.90mm/px · z∈[-74,+84]mm · 13 of 160 slices shown (2 of 2)]
[im 1/160]
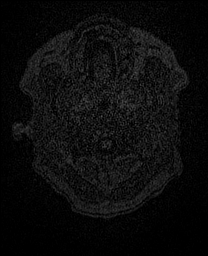
[im 14/160]
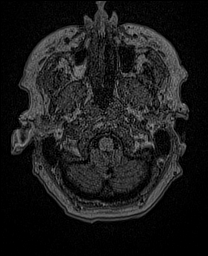
[im 27/160]
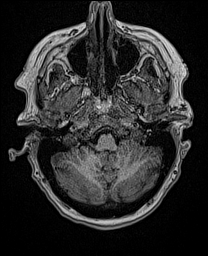
[im 40/160]
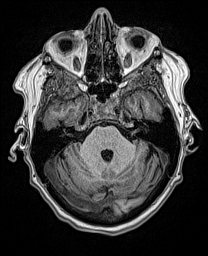
[im 54/160]
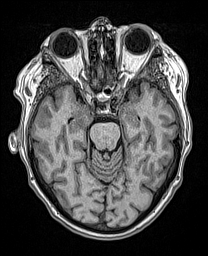
[im 67/160]
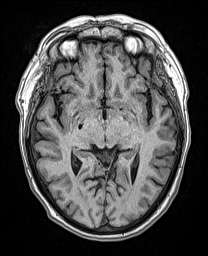
[im 80/160]
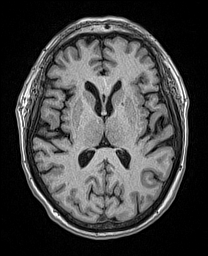
[im 93/160]
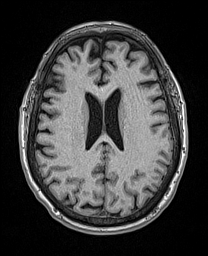
[im 107/160]
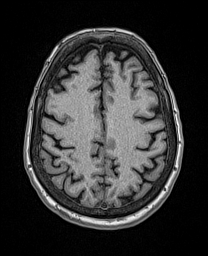
[im 120/160]
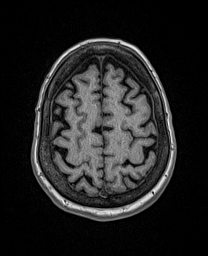
[im 133/160]
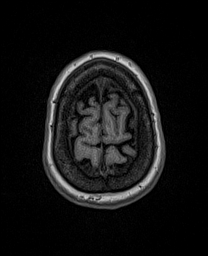
[im 146/160]
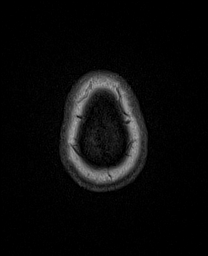
[im 160/160]
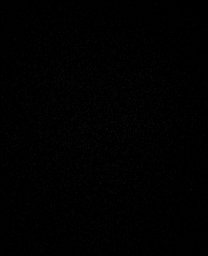

[Series 19: cor dwi_tracew · coronal · 5.0mm · 1.53mm/px · 5 of 56 slices shown]
[im 1/56]
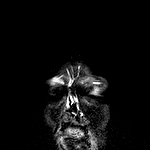
[im 14/56]
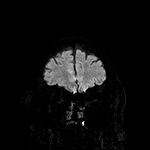
[im 28/56]
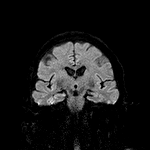
[im 42/56]
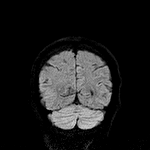
[im 56/56]
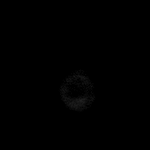

[Series 20: cor dwi_adc · coronal · 5.0mm · 1.53mm/px · 2 of 28 slices shown]
[im 1/28]
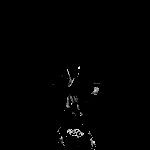
[im 28/28]
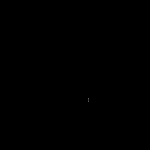

[Series 22: T2 · coronal · 5.0mm · 0.57mm/px · 2 of 28 slices shown (2 of 2)]
[im 1/28]
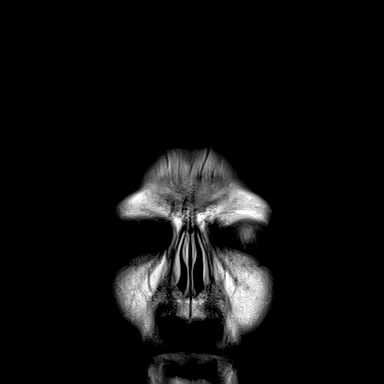
[im 28/28]
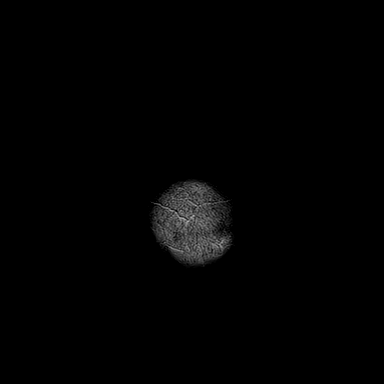

[48 of 48 positions shown; findings below may reference images not displayed]

FINDINGS: Brain: Cerebral volume within normal limits. Few scattered
subcentimeter foci of T2/FLAIR hyperintensity noted involving the
periventricular and deep white matter of both frontal lobes, minimal
in nature, and felt to be within normal limits for age.

No abnormal foci of restricted diffusion to suggest acute or
subacute ischemia. Gray-white matter differentiation maintained. No
areas of chronic cortical infarction. No evidence for acute or
chronic intracranial hemorrhage.

No mass lesion, mass effect or midline shift. No hydrocephalus or
extra-axial fluid collection. Pituitary gland suprasellar region
within normal limits.

Vascular: Major intracranial vascular flow voids are maintained.

Skull and upper cervical spine: Mild cerebellar tonsillar ectopia of
4 mm without frank Chiari malformation. Bone marrow signal intensity
within normal limits. No scalp soft tissue abnormality.

Sinuses/Orbits: Prior bilateral ocular lens replacement. Globes and
orbital soft tissues demonstrate no acute finding. Paranasal sinuses
are largely clear. No significant mastoid effusion. Inner ear
structures grossly normal.

Other: None.
IMPRESSION: Normal brain MRI for age. No acute intracranial abnormality
identified.

ADDENDUM:
Coronal DWI images have been loaded for review since time of initial
dictation. No new findings are seen.

*** End of Addendum ***
FINDINGS: Brain: Cerebral volume within normal limits. Few scattered
subcentimeter foci of T2/FLAIR hyperintensity noted involving the
periventricular and deep white matter of both frontal lobes, minimal
in nature, and felt to be within normal limits for age.

No abnormal foci of restricted diffusion to suggest acute or
subacute ischemia. Gray-white matter differentiation maintained. No
areas of chronic cortical infarction. No evidence for acute or
chronic intracranial hemorrhage.

No mass lesion, mass effect or midline shift. No hydrocephalus or
extra-axial fluid collection. Pituitary gland suprasellar region
within normal limits.

Vascular: Major intracranial vascular flow voids are maintained.

Skull and upper cervical spine: Mild cerebellar tonsillar ectopia of
4 mm without frank Chiari malformation. Bone marrow signal intensity
within normal limits. No scalp soft tissue abnormality.

Sinuses/Orbits: Prior bilateral ocular lens replacement. Globes and
orbital soft tissues demonstrate no acute finding. Paranasal sinuses
are largely clear. No significant mastoid effusion. Inner ear
structures grossly normal.

Other: None.
IMPRESSION: Normal brain MRI for age. No acute intracranial abnormality
identified.

## 2022-05-24 NOTE — Progress Notes (Signed)
Patient ID: [name], female    DOB: [birth year], 61 y.o.   MRN: [id number]  Chief Complaint  Patient presents with   Cough    Pt c/o dry cough, hoarseness, ear pain in both ears, lower back pain for about 4 days. Pt states she has tried cough syrup, breathing treatments and tylenol which did help some.     HPI: COPD exacerbation: sx started as cough 3 days ago, then developed nasal drainage yesterday, bilateral ear pain, denies fever, and reports dry cough, non-productive, did nebulizer tx last night only. has pulmonologist at [facility] who specializes in pulmonary sarcoidosis.   Assessment & Plan:  1. Asthma with COPD with exacerbation ([place]) Advised pt to increase her Breo inhaler to 1 puff bid & use nebulizer in the morning and evenings for next 3 days. Increase water intake, use '200mg'$  of Tessalon pearles tid prn (pt has at home). Call back end of week if sx are no better.  2. Cough, unspecified type rapid covid negative.  - POC COVID-19   Subjective:    Outpatient Medications Prior to Visit  Medication Sig Dispense Refill   albuterol (PROVENTIL HFA;VENTOLIN HFA) 108 (90 Base) MCG/ACT inhaler Inhale 1-2 puffs into the lungs every 6 (six) hours as needed for wheezing or shortness of breath. 8 g 5   albuterol (PROVENTIL) (2.5 MG/3ML) 0.083% nebulizer solution USE 1 VIAL VIA NEBULIZXER EVERY 6 HOURS AS NEEDED FOR WHEEZING OR SHORTNESS OF BREATH     allopurinol (ZYLOPRIM) 300 MG tablet TAKE 1 TABLET BY MOUTH EVERY DAY 90 tablet 0   amoxicillin-clavulanate (AUGMENTIN) 875-125 MG tablet Take 1 tablet by mouth 2 (two) times daily. 20 tablet 0   aspirin EC 81 MG tablet Take 1 tablet (81 mg total) by mouth daily. 30 tablet 11   atorvastatin (LIPITOR) 40 MG tablet TAKE 1 TABLET BY MOUTH EVERY DAY 90 tablet 3   benzonatate (TESSALON PERLES) 100 MG capsule Take 1 capsule (100 mg total) by mouth 3 (three) times daily as needed for cough. 20 capsule 0   BREO ELLIPTA 200-25 MCG/INH AEPB TAKE  1 PUFF BY MOUTH EVERY DAY 60 each 5   buPROPion (WELLBUTRIN XL) 300 MG 24 hr tablet TAKE 1 TABLET BY MOUTH EVERY DAY 90 tablet 3   carvedilol (COREG) 25 MG tablet Take 1 tablet by mouth 2 (two) times daily with a meal.     Cholecalciferol 25 MCG (1000 UT) capsule Take 1,000 Units by mouth in the morning and at bedtime.     Continuous Blood Gluc Sensor (FREESTYLE LIBRE 3 SENSOR) MISC 1 Device by Does not apply route every 14 (fourteen) days. Apply 1 sensor on upper arm every 14 days for continuous glucose monitoring 2 each 2   diclofenac Sodium (VOLTAREN) 1 % GEL Apply 1 application topically as needed (pain.).      diltiazem (CARDIZEM CD) 120 MG 24 hr capsule TAKE 1 CAPSULE BY MOUTH EVERY DAY 90 capsule 3   Dulaglutide (TRULICITY) 4.5 QZ/3.0QT SOPN Inject 4.5 mg as directed once a week. 6 mL 3   famotidine (PEPCID) 20 MG tablet Take 1 tablet (20 mg total) by mouth at bedtime. 30 tablet 3   fenofibrate 160 MG tablet TAKE 1 TABLET BY MOUTH EVERY DAY 90 tablet 3   fluticasone (FLONASE) 50 MCG/ACT nasal spray Place 2 sprays into both nostrils daily as needed for allergies. 48 mL 3   furosemide (LASIX) 40 MG tablet TAKE 1 TABLET BY MOUTH TWICE A DAY  180 tablet 1   gabapentin (NEURONTIN) 100 MG capsule TAKE 1 CAPSULE BY MOUTH THREE TIMES A DAY 270 capsule 3   gabapentin (NEURONTIN) 300 MG capsule Take at night in addition to the '100mg'$  gabapentin to get '400mg'$  at night and '100mg'$  in the morning and at mid day. 90 capsule 3   glucose blood (CONTOUR NEXT TEST) test strip 1 each by Other route 4 (four) times daily. And lancets 4/day 400 each 3   HYDROcodone-acetaminophen (NORCO/VICODIN) 5-325 MG tablet Take 1 tablet by mouth every 6 (six) hours as needed. 15 tablet 0   insulin aspart (NOVOLOG FLEXPEN) 100 UNIT/ML FlexPen Inject 15 Units into the skin daily with supper. 15 mL 11   insulin glargine (LANTUS SOLOSTAR) 100 UNIT/ML Solostar Pen Inject 16 Units into the skin every morning. 30 mL 2   Insulin Pen  Needle (PEN NEEDLES) 32G X 4 MM MISC 1 each by Does not apply route 4 (four) times daily. E11.9 400 each 0   ipratropium (ATROVENT) 0.03 % nasal spray PLACE 2 SPRAYS INTO BOTH NOSTRILS EVERY 12 (TWELVE) HOURS. 30 mL 2   KLOR-CON M20 20 MEQ tablet TAKE 1 & 1/2 TABLETS BY MOUTH TWICE A DAY 270 tablet 2   lactulose (CHRONULAC) 10 GM/15ML solution TAKE 15 MLS BY MOUTH DAILY AS NEEDED FOR CONSTIPATION. 1419 mL 1   Lancet Devices (EASY MINI EJECT LANCING DEVICE) MISC Use as directed 4 times daily to test blood sugar 1 each 3   LINZESS 290 MCG CAPS capsule TAKE 1 CAPSULE BY MOUTH DAILY BEFORE BREAKFAST. 30 capsule 3   LORazepam (ATIVAN) 0.5 MG tablet Take 0.5 mg by mouth every 12 (twelve) hours as needed for anxiety.     meloxicam (MOBIC) 15 MG tablet Take 1 tablet (15 mg total) by mouth daily. 30 tablet 1   metFORMIN (GLUCOPHAGE-XR) 500 MG 24 hr tablet Take 4 tablets (2,000 mg total) by mouth daily with breakfast. 360 tablet 3   metoCLOPramide (REGLAN) 10 MG tablet Take 1 tablet (10 mg total) by mouth every 6 (six) hours. 30 tablet 0   Microlet Lancets MISC 1 each by Does not apply route 2 (two) times daily. E11.9 100 each 2   nitroGLYCERIN (NITROSTAT) 0.4 MG SL tablet Place 1 tablet (0.4 mg total) under the tongue every 5 (five) minutes as needed for chest pain. Reported on 01/05/2016 25 tablet 6   nystatin (MYCOSTATIN) 100000 UNIT/ML suspension nystatin 100,000 unit/mL oral suspension  SWISH, GARGLE, AND SPIT 20 ML 4 TIMES A DAY UNTIL RESOLVED     ondansetron (ZOFRAN-ODT) 4 MG disintegrating tablet Take 1 tablet (4 mg total) by mouth every 8 (eight) hours as needed for nausea or vomiting. 20 tablet 0   oxyCODONE-acetaminophen (PERCOCET/ROXICET) 5-325 MG tablet Take 1 tablet by mouth every 4 (four) hours as needed.     pantoprazole (PROTONIX) 40 MG tablet Take 1 tablet (40 mg total) by mouth daily. 90 tablet 3   phenazopyridine (PYRIDIUM) 100 MG tablet Take 1 tablet (100 mg total) by mouth 3 (three) times  daily as needed for pain. 6 tablet 0   telmisartan (MICARDIS) 20 MG tablet TAKE 1 TABLET BY MOUTH EVERY DAY 90 tablet 1   traMADol (ULTRAM) 50 MG tablet TAKE 1 TABLET BY MOUTH EVERY 12 HOURS AS NEEDED FOR MODERATE TO SEVERE PAIN     triamcinolone cream (KENALOG) 0.1 % APPLY TOPICALLY TO RASH 2 TIMES DAILY AS NEEDED. STOP USE AFTER 7-10 DAYS     venlafaxine XR (  EFFEXOR-XR) 75 MG 24 hr capsule TAKE 1 CAPSULE BY MOUTH TWICE A DAY 180 capsule 3   No facility-administered medications prior to visit.   Past Medical History:  Diagnosis Date   Abnormal SPEP 04/17/2014   Acute left ankle pain 01/26/2017   ANEMIA-UNSPECIFIED 09/18/2009   Anxiety    Arthritis    Blood transfusion without reported diagnosis    Cataract    CHF (congestive heart failure) (HCC)    Chronic diastolic heart failure, NYHA class 2 (HCC)    Normal LVEDP by May 2018   COPD (chronic obstructive pulmonary disease) ([place])    Depression    DIABETES MELLITUS, TYPE II 08/21/2006   Diabetic osteomyelitis ([place]) 05/29/2015   Fatty liver    Fracture of 5th metatarsal    non union   GERD 42/68/3419   Had fundoplication   GOUT 62/22/9798   Hammer toe of right foot    2-5th toes   Hx of umbilical hernia repair    HYPERLIPIDEMIA 08/21/2006   HYPERTENSION 08/21/2006   Infection of wound due to methicillin resistant Staphylococcus aureus (MRSA)    Internal hemorrhoids    Kidney problem    Multiple allergies 10/14/2016   OBESITY 06/04/2009   Onychomycosis 10/27/2015   Osteomyelitis of left foot ([place]) 05/29/2015   Osteoporosis    Pulmonary sarcoidosis ([place])    Followed locally by pulmonology, but also by Dr. Casper Harrison at [facility]   Right knee pain 01/26/2017   Vocal cord dysfunction    Wears partial dentures    Past Surgical History:  Procedure Laterality Date   ABDOMINAL HYSTERECTOMY     APPENDECTOMY     BLADDER SUSPENSION  11/11/2011   Procedure: TRANSVAGINAL TAPE (TVT) PROCEDURE;  Surgeon: Olga Millers, MD;   Location: [facility];  Service: Gynecology;  Laterality: N/A;   CAROTIDS  02/18/2011   CAROTID DUPLEX; VERTEBRALS ARE PATENT WITH ANTEGRADE FLOW. ICA/CCA RATIO 1.61 ON RIGHT AND 0.75 ON LEFT   CATARACT EXTRACTION, BILATERAL     CHOLECYSTECTOMY  1984   COLONOSCOPY  04/29/2010   [name]   CYSTOSCOPY  11/11/2011   Procedure: CYSTOSCOPY;  Surgeon: Olga Millers, MD;  Location: [facility];  Service: Gynecology;  Laterality: N/A;   EXTUBATION (ENDOTRACHEAL) IN OR N/A 09/23/2017   Procedure: EXTUBATION (ENDOTRACHEAL) IN OR;  Surgeon: Helayne Seminole, MD;  Location: [place];  Service: ENT;  Laterality: N/A;   FIBEROPTIC LARYNGOSCOPY AND TRACHEOSCOPY N/A 09/23/2017   Procedure: FLEXIBLE FIBEROPTIC LARYNGOSCOPY;  Surgeon: Helayne Seminole, MD;  Location: [place];  Service: ENT;  Laterality: N/A;   FRACTURE SURGERY     foot   [place] Left 10/02/2018   Procedure: HALLUX ARTHRODESIS;  Surgeon: Edrick Kins, DPM;  Location: [facility];  Service: Podiatry;  Laterality: Left;   HAMMER TOE SURGERY Left 10/02/2018   Procedure: HAMMER TOE CORRECTION 2ND 3RD 4RD FIFTH TOE;  Surgeon: Edrick Kins, DPM;  Location: [facility];  Service: Podiatry;  Laterality: Left;   HAMMER TOE SURGERY Right 04/12/2019   Procedure: HAMMER TOE CORRECTION, 2ND, 3RD, 4TH AND 5TH TOES OF RIGHT FOOT;  Surgeon: Edrick Kins, DPM;  Location: [place];  Service: Podiatry;  Laterality: Right;   HAMMER TOE SURGERY Left 10/25/2019   Procedure: HAMMER TOE REPAIR SECOND, THIRD, FOUTH;  Surgeon: Edrick Kins, DPM;  Location: [facility];  Service: Podiatry;  Laterality: Left;   HERNIA REPAIR     I & D EXTREMITY Left 06/27/2015  Procedure: Partial Excision Left Calcaneus, Place Antibiotic Beads, and Wound VAC;  Surgeon: Newt Minion, MD;  Location: [place];  Service: Orthopedics;  Laterality: Left;   JOINT REPLACEMENT     KNEE ARTHROSCOPY     right   LEFT AND RIGHT HEART CATHETERIZATION WITH CORONARY ANGIOGRAM N/A 04/23/2013   Procedure: LEFT AND  RIGHT HEART CATHETERIZATION WITH CORONARY ANGIOGRAM;  Surgeon: Leonie Man, MD;  Location: [facility];  Service: Cardiovascular;  Laterality: N/A;   LEFT HEART CATH AND CORONARY ANGIOGRAPHY N/A 03/11/2017   Procedure: Left Heart Cath and Coronary Angiography;  Surgeon: Sherren Mocha, MD;  Location: [place] [facility]; angiographically minimal CAD in the LAD otherwise normal.;  Normal LVEDP.  FALSE POSITIVE MYOVIEW   LEFT HEART CATH AND CORONARY ANGIOGRAPHY  07/20/2010   LVEF 50-55% WITH VERY MILD GLOBAL HYPOKINESIA; ESSENTIALLY NORMAL CORONARY ARTERIES; NORMAL LV FUNCTION   METATARSAL OSTEOTOMY WITH OPEN REDUCTION INTERNAL FIXATION (ORIF) METATARSAL WITH FUSION Left 04/09/2014   Procedure: LEFT FOOT FRACTURE OPEN TREATMENT METATARSAL INCLUDES INTERNAL FIXATION EACH;  Surgeon: Lorn Junes, MD;  Location: [place];  Service: Orthopedics;  Laterality: Left;   NISSEN FUNDOPLICATION  2992   NM MYOVIEW LTD --> FALSE POSITIVE  03/10/2017   : Moderate size "stress-induced "perfusion defect at the apex as well as "ill-defined stress-induced perfusion defect in the lateral wall.  EF 55%.  INTERMEDIATE risk. -->  FALSE POSITIVE   ORIF DISTAL FEMUR FRACTURE  08/08/2020   [facility]: ORIF of left extra-articular periprosthetic distal femur fracture with synthesis of the femur plate with cortical nonlocking screws.  (Thought to be pathologic fracture due to osteoporosis) -> after a fall   ORIF FEMUR W/ PERI-IMPLANT  08/29/2020   [facility]) revision of left femur fracture ORIF plate fixation screws; culture positive for MSSA   Right and left CARDIAC CATHETERIZATION  04/23/2013   Angiographic normal coronaries; LVEDP 20 mmHg, PCWP 12-14 mmHg, RAP 12 mmHg.; Fick CO/CI 4.9/2.2   ROTATOR CUFF REPAIR Right 12/09/2021   SHOULDER ARTHROSCOPY Left 03/14/2019   Procedure: LEFT SHOULDER ARTHROSCOPY, DEBRIDEMENT, AND DECOMPRESSION;  Surgeon: Newt Minion, MD;   Location: [place];  Service: Orthopedics;  Laterality: Left;   TOTAL KNEE ARTHROPLASTY Right 06/29/2017   Procedure: RIGHT TOTAL KNEE ARTHROPLASTY;  Surgeon: Newt Minion, MD;  Location: [place];  Service: Orthopedics;  Laterality: Right;   TOTAL KNEE ARTHROPLASTY Left 12/07/2017   TOTAL KNEE ARTHROPLASTY Left 12/07/2017   Procedure: LEFT TOTAL KNEE ARTHROPLASTY;  Surgeon: Newt Minion, MD;  Location: [place];  Service: Orthopedics;  Laterality: Left;   TRACHEOSTOMY TUBE PLACEMENT N/A 09/20/2017   Procedure: AWAKE INTUBATION WITH ANESTHESIA WITH VIDEO ASSISTANCE;  Surgeon: Helayne Seminole, MD;  Location: [facility] [facility];  Service: ENT;  Laterality: N/A;   TRANSTHORACIC ECHOCARDIOGRAM  08/2014   Normal LV size and function.  Mild LVH.  EF 55-60%.  Normal regional wall motion.  GR 1 DD.  Normal RV size and function .   TRANSTHORACIC ECHOCARDIOGRAM  08/12/2020    [facility]): Normal LV size and function.  EF 55 to 60%.  No RWM A.  Normal filling pattern.  Normal RV.  No significant valvular disease-stenosis or regurgitation.  No vegetation.   TUBAL LIGATION     with reversal in 1994   UPPER GASTROINTESTINAL ENDOSCOPY     VENTRAL HERNIA REPAIR     Allergies  Allergen Reactions   Methotrexate Other (  See Comments)    Peri-oral and buccal lesions   Vancomycin Other (See Comments)    DOSE RELATED NEPHROTOXICITY Other reaction(s): other   Lisinopril Cough    Other reaction(s): other   Chlorhexidine Itching   Clindamycin/Lincomycin Nausea And Vomiting and Rash   Lincomycin Nausea Only and Rash    Other reaction(s): vomiting   Teflaro [Ceftaroline] Rash and Other (See Comments)    Tolerates ceftriaxone       Objective:    Physical Exam Vitals and nursing note reviewed.  Constitutional:      Appearance: Normal appearance.  HENT:     Right Ear: Tympanic membrane and ear canal normal.     Left Ear: Tympanic membrane and ear canal normal.     Mouth/Throat:     Mouth: Mucous  membranes are moist.     Pharynx: Oropharyngeal exudate present. No pharyngeal swelling, posterior oropharyngeal erythema or uvula swelling.     Tonsils: No tonsillar exudate.  Cardiovascular:     Rate and Rhythm: Normal rate and regular rhythm.  Pulmonary:     Effort: Pulmonary effort is normal.     Breath sounds: Normal breath sounds.  Musculoskeletal:        General: Normal range of motion.  Lymphadenopathy:     Head:     Right side of head: Preauricular adenopathy present.     Left side of head: Preauricular adenopathy present.     Cervical: No cervical adenopathy.  Skin:    General: Skin is warm and dry.  Neurological:     Mental Status: She is alert.  Psychiatric:        Mood and Affect: Mood normal.        Behavior: Behavior normal.   BP (!) 140/85 (BP Location: Left Arm, Patient Position: Sitting, Cuff Size: Large)   Pulse 87   Temp 98.3 F (36.8 C) (Temporal)   Ht '5\' 6"'$  (1.676 m)   Wt 222 lb 3.2 oz (100.8 kg)   SpO2 99%   BMI 35.86 kg/m  Wt Readings from Last 3 Encounters:  05/24/22 222 lb 3.2 oz (100.8 kg)  05/03/22 220 lb 6.4 oz (100 kg)  04/28/22 222 lb 8 oz (100.9 kg)       Jeanie Sewer, NP

## 2022-05-24 NOTE — Patient Instructions (Addendum)
It was very nice to see you today!    Increase your Breo inhaler to 1 puff twice a day for the next 3 days only. Continue to use your nebulizer in the morning and before bedtime for the next 3 days and your Albuterol inhaler as needed.  Increase your water intake to 2 liters daily.  After doing the above, and still not better, call or message the office on Thursday or Friday.     PLEASE NOTE:  If you had any lab tests please let us know if you have not heard back within a few days. You may see your results on MyChart before we have a chance to review them but we will give you a call once they are reviewed by [place]. If we ordered any referrals today, please let us know if you have not heard from their office within the next week.

## 2022-05-25 ENCOUNTER — Encounter: Payer: Self-pay | Admitting: Family

## 2022-05-25 DIAGNOSIS — R059 Cough, unspecified: Secondary | ICD-10-CM

## 2022-05-25 MED ORDER — BENZONATATE 100 MG PO CAPS: 200.0000 mg | ORAL_CAPSULE | Freq: Three times a day (TID) | ORAL | 0 refills | Status: DC | PRN

## 2022-05-27 ENCOUNTER — Telehealth: Payer: Self-pay | Admitting: Family

## 2022-05-27 DIAGNOSIS — J441 Chronic obstructive pulmonary disease with (acute) exacerbation: Secondary | ICD-10-CM

## 2022-05-27 MED ORDER — AZITHROMYCIN 250 MG PO TABS: ORAL_TABLET | ORAL | 0 refills | Status: AC

## 2022-05-27 NOTE — Addendum Note (Signed)
Addended byJeanie Sewer on: 05/27/2022 05:05 PM   Modules accepted: Orders

## 2022-05-27 NOTE — Telephone Encounter (Signed)
told her to call tomorrow if sx were no better - ask specific symptoms - any improvement at all? if some improvement, better to not take antibiotic, but if no improvement will send at end of day, let me know, thx.

## 2022-05-27 NOTE — Telephone Encounter (Signed)
Pt states she still has a cough, hoarseness and still hurting in her chest since when she was here at her appointment. Pt states there was no improvement and she still feels the same. Let pt know an antibiotic will be [name] [place] [facility] [address].

## 2022-05-27 NOTE — Telephone Encounter (Signed)
Patient states she was told at office visit on 05/24/22 that if her symptoms did not improve by today (which Patient states have not improved) a RX for a Z-Pack would be sent in to Pharmacy.  Patient requests RX for Z-Pack [facility] to  [name] [place] [address] [place] Phone:  [phone]  Fax:  [phone]

## 2022-06-05 ENCOUNTER — Encounter (HOSPITAL_BASED_OUTPATIENT_CLINIC_OR_DEPARTMENT_OTHER): Payer: Self-pay

## 2022-06-05 ENCOUNTER — Emergency Department (HOSPITAL_BASED_OUTPATIENT_CLINIC_OR_DEPARTMENT_OTHER): Payer: BC Managed Care – PPO | Admitting: Radiology

## 2022-06-05 ENCOUNTER — Emergency Department (HOSPITAL_BASED_OUTPATIENT_CLINIC_OR_DEPARTMENT_OTHER): Payer: BC Managed Care – PPO

## 2022-06-05 ENCOUNTER — Other Ambulatory Visit: Payer: Self-pay

## 2022-06-05 ENCOUNTER — Inpatient Hospital Stay (HOSPITAL_BASED_OUTPATIENT_CLINIC_OR_DEPARTMENT_OTHER)
Admission: EM | Admit: 2022-06-05 | Discharge: 2022-06-09 | DRG: 191 | Disposition: A | Discharge status: Home / Self Care | Payer: BC Managed Care – PPO | Attending: Internal Medicine | Admitting: Internal Medicine

## 2022-06-05 ENCOUNTER — Encounter (HOSPITAL_COMMUNITY): Payer: Self-pay

## 2022-06-05 DIAGNOSIS — E1129 Type 2 diabetes mellitus with other diabetic kidney complication: Secondary | ICD-10-CM | POA: Diagnosis present

## 2022-06-05 DIAGNOSIS — F419 Anxiety disorder, unspecified: Secondary | ICD-10-CM | POA: Diagnosis present

## 2022-06-05 DIAGNOSIS — J42 Unspecified chronic bronchitis: Secondary | ICD-10-CM | POA: Diagnosis not present

## 2022-06-05 DIAGNOSIS — J209 Acute bronchitis, unspecified: Secondary | ICD-10-CM | POA: Diagnosis not present

## 2022-06-05 DIAGNOSIS — Z791 Long term (current) use of non-steroidal anti-inflammatories (NSAID): Secondary | ICD-10-CM

## 2022-06-05 DIAGNOSIS — Z96653 Presence of artificial knee joint, bilateral: Secondary | ICD-10-CM | POA: Diagnosis not present

## 2022-06-05 DIAGNOSIS — Z981 Arthrodesis status: Secondary | ICD-10-CM | POA: Diagnosis not present

## 2022-06-05 DIAGNOSIS — Z9841 Cataract extraction status, right eye: Secondary | ICD-10-CM | POA: Diagnosis not present

## 2022-06-05 DIAGNOSIS — K219 Gastro-esophageal reflux disease without esophagitis: Secondary | ICD-10-CM | POA: Diagnosis present

## 2022-06-05 DIAGNOSIS — N183 Chronic kidney disease, stage 3 unspecified: Secondary | ICD-10-CM | POA: Diagnosis present

## 2022-06-05 DIAGNOSIS — J386 Stenosis of larynx: Secondary | ICD-10-CM | POA: Diagnosis not present

## 2022-06-05 DIAGNOSIS — F325 Major depressive disorder, single episode, in full remission: Secondary | ICD-10-CM | POA: Diagnosis present

## 2022-06-05 DIAGNOSIS — Z9842 Cataract extraction status, left eye: Secondary | ICD-10-CM | POA: Diagnosis not present

## 2022-06-05 DIAGNOSIS — Z841 Family history of disorders of kidney and ureter: Secondary | ICD-10-CM

## 2022-06-05 DIAGNOSIS — R0602 Shortness of breath: Secondary | ICD-10-CM | POA: Diagnosis not present

## 2022-06-05 DIAGNOSIS — I10 Essential (primary) hypertension: Secondary | ICD-10-CM | POA: Diagnosis present

## 2022-06-05 DIAGNOSIS — N1831 Chronic kidney disease, stage 3a: Secondary | ICD-10-CM | POA: Diagnosis not present

## 2022-06-05 DIAGNOSIS — M109 Gout, unspecified: Secondary | ICD-10-CM | POA: Diagnosis present

## 2022-06-05 DIAGNOSIS — E1140 Type 2 diabetes mellitus with diabetic neuropathy, unspecified: Secondary | ICD-10-CM | POA: Diagnosis not present

## 2022-06-05 DIAGNOSIS — Z888 Allergy status to other drugs, medicaments and biological substances status: Secondary | ICD-10-CM | POA: Diagnosis not present

## 2022-06-05 DIAGNOSIS — R059 Cough, unspecified: Secondary | ICD-10-CM | POA: Diagnosis not present

## 2022-06-05 DIAGNOSIS — E785 Hyperlipidemia, unspecified: Secondary | ICD-10-CM | POA: Diagnosis present

## 2022-06-05 DIAGNOSIS — J440 Chronic obstructive pulmonary disease with acute lower respiratory infection: Secondary | ICD-10-CM | POA: Diagnosis present

## 2022-06-05 DIAGNOSIS — I3139 Other pericardial effusion (noninflammatory): Secondary | ICD-10-CM | POA: Diagnosis present

## 2022-06-05 DIAGNOSIS — I152 Hypertension secondary to endocrine disorders: Secondary | ICD-10-CM | POA: Diagnosis not present

## 2022-06-05 DIAGNOSIS — Z794 Long term (current) use of insulin: Secondary | ICD-10-CM | POA: Diagnosis not present

## 2022-06-05 DIAGNOSIS — J441 Chronic obstructive pulmonary disease with (acute) exacerbation: Secondary | ICD-10-CM | POA: Diagnosis not present

## 2022-06-05 DIAGNOSIS — Z8249 Family history of ischemic heart disease and other diseases of the circulatory system: Secondary | ICD-10-CM

## 2022-06-05 DIAGNOSIS — I272 Pulmonary hypertension, unspecified: Secondary | ICD-10-CM | POA: Diagnosis not present

## 2022-06-05 DIAGNOSIS — I130 Hypertensive heart and chronic kidney disease with heart failure and stage 1 through stage 4 chronic kidney disease, or unspecified chronic kidney disease: Secondary | ICD-10-CM | POA: Diagnosis not present

## 2022-06-05 DIAGNOSIS — I5032 Chronic diastolic (congestive) heart failure: Secondary | ICD-10-CM | POA: Diagnosis not present

## 2022-06-05 DIAGNOSIS — Z6835 Body mass index (BMI) 35.0-35.9, adult: Secondary | ICD-10-CM

## 2022-06-05 DIAGNOSIS — K760 Fatty (change of) liver, not elsewhere classified: Secondary | ICD-10-CM | POA: Diagnosis present

## 2022-06-05 DIAGNOSIS — N1832 Chronic kidney disease, stage 3b: Secondary | ICD-10-CM | POA: Diagnosis present

## 2022-06-05 DIAGNOSIS — E66812 Obesity, class 2: Secondary | ICD-10-CM | POA: Diagnosis present

## 2022-06-05 DIAGNOSIS — Z7982 Long term (current) use of aspirin: Secondary | ICD-10-CM

## 2022-06-05 DIAGNOSIS — Z833 Family history of diabetes mellitus: Secondary | ICD-10-CM

## 2022-06-05 DIAGNOSIS — R061 Stridor: Secondary | ICD-10-CM | POA: Diagnosis present

## 2022-06-05 DIAGNOSIS — E669 Obesity, unspecified: Secondary | ICD-10-CM | POA: Diagnosis not present

## 2022-06-05 DIAGNOSIS — Z7984 Long term (current) use of oral hypoglycemic drugs: Secondary | ICD-10-CM

## 2022-06-05 DIAGNOSIS — E1122 Type 2 diabetes mellitus with diabetic chronic kidney disease: Secondary | ICD-10-CM | POA: Diagnosis not present

## 2022-06-05 DIAGNOSIS — Z8349 Family history of other endocrine, nutritional and metabolic diseases: Secondary | ICD-10-CM

## 2022-06-05 DIAGNOSIS — E1159 Type 2 diabetes mellitus with other circulatory complications: Secondary | ICD-10-CM | POA: Diagnosis not present

## 2022-06-05 DIAGNOSIS — Z20822 Contact with and (suspected) exposure to covid-19: Secondary | ICD-10-CM | POA: Diagnosis present

## 2022-06-05 DIAGNOSIS — Z79899 Other long term (current) drug therapy: Secondary | ICD-10-CM

## 2022-06-05 DIAGNOSIS — Z825 Family history of asthma and other chronic lower respiratory diseases: Secondary | ICD-10-CM

## 2022-06-05 DIAGNOSIS — F3342 Major depressive disorder, recurrent, in full remission: Secondary | ICD-10-CM | POA: Diagnosis not present

## 2022-06-05 LAB — BASIC METABOLIC PANEL
Anion gap: 12 (ref 5–15)
BUN: 22 mg/dL (ref 8–23)
CO2: 24 mmol/L (ref 22–32)
Calcium: 9.6 mg/dL (ref 8.9–10.3)
Chloride: 101 mmol/L (ref 98–111)
Creatinine, Ser: 1.21 mg/dL — ABNORMAL HIGH (ref 0.44–1.00)
GFR, Estimated: 51 mL/min — ABNORMAL LOW (ref >60)
Glucose, Bld: 191 mg/dL — ABNORMAL HIGH (ref 70–99)
Potassium: 4.3 mmol/L (ref 3.5–5.1)
Sodium: 137 mmol/L (ref 135–145)

## 2022-06-05 LAB — CBC
HCT: 32.6 % — ABNORMAL LOW (ref 36.0–46.0)
Hemoglobin: 10.7 g/dL — ABNORMAL LOW (ref 12.0–15.0)
MCH: 30.3 pg (ref 26.0–34.0)
MCHC: 32.8 g/dL (ref 30.0–36.0)
MCV: 92.4 fL (ref 80.0–100.0)
Platelets: 372 10*3/uL (ref 150–400)
RBC: 3.53 MIL/uL — ABNORMAL LOW (ref 3.87–5.11)
RDW: 13.4 % (ref 11.5–15.5)
WBC: 13.5 10*3/uL — ABNORMAL HIGH (ref 4.0–10.5)
nRBC: 0 % (ref 0.0–0.2)

## 2022-06-05 LAB — TROPONIN I (HIGH SENSITIVITY)
Troponin I (High Sensitivity): 5 ng/L (ref <18)
Troponin I (High Sensitivity): 5 ng/L (ref <18)

## 2022-06-05 MED ORDER — SODIUM CHLORIDE 0.9 % IV SOLN
3.0000 g | Freq: Once | INTRAVENOUS | Status: AC
  Administered 2022-06-05: 3 g via INTRAVENOUS

## 2022-06-05 MED ORDER — ALBUTEROL SULFATE (2.5 MG/3ML) 0.083% IN NEBU
INHALATION_SOLUTION | RESPIRATORY_TRACT | Status: AC
  Administered 2022-06-05: 2.5 mg
  Filled 2022-06-05: qty 3

## 2022-06-05 MED ORDER — IOHEXOL 350 MG/ML SOLN
100.0000 mL | Freq: Once | INTRAVENOUS | Status: AC | PRN
  Administered 2022-06-05: 100 mL via INTRAVENOUS

## 2022-06-05 MED ORDER — IPRATROPIUM-ALBUTEROL 0.5-2.5 (3) MG/3ML IN SOLN
RESPIRATORY_TRACT | Status: AC
  Administered 2022-06-05: 3 mL
  Filled 2022-06-05: qty 3

## 2022-06-05 MED ORDER — DEXAMETHASONE SODIUM PHOSPHATE 10 MG/ML IJ SOLN
10.0000 mg | Freq: Once | INTRAMUSCULAR | Status: AC
  Administered 2022-06-05: 10 mg via INTRAVENOUS
  Filled 2022-06-05: qty 1

## 2022-06-05 MED ORDER — LORAZEPAM 2 MG/ML IJ SOLN
0.5000 mg | Freq: Once | INTRAMUSCULAR | Status: AC
  Administered 2022-06-05: 0.5 mg via INTRAVENOUS
  Filled 2022-06-05: qty 1

## 2022-06-05 NOTE — ED Notes (Signed)
Called [facility] for transfer to [facility] ED.  Dr. Kreg Shropshire accepting

## 2022-06-05 NOTE — ED Provider Notes (Signed)
[facility] EMERGENCY DEPT Provider Note  CSN: [id number] Arrival date & time: 06/05/22 1820  Chief Complaint(s) Cough  HPI [name] is a 61 y.o. female with history of CHF, COPD diabetes, hyperlipidemia, hypertension, sarcoidosis, vocal cord spasm presenting to the emergency department with 3 weeks of cough, wheezing.  She tried Z-Pak without improvement.  Reports mild chest pain, no fevers or chills.  She tried breathing treatments at home without improvement.  She reports mild sore throat from coughing.  No nausea, vomiting.  No abdominal pain.  No back pain.  No leg swelling.  No recent travel or surgeries.   Past Medical History Past Medical History:  Diagnosis Date   Abnormal SPEP 04/17/2014   Acute left ankle pain 01/26/2017   ANEMIA-UNSPECIFIED 09/18/2009   Anxiety    Arthritis    Blood transfusion without reported diagnosis    Cataract    CHF (congestive heart failure) (HCC)    Chronic diastolic heart failure, NYHA class 2 (HCC)    Normal LVEDP by May 2018   COPD (chronic obstructive pulmonary disease) ([place])    Depression    DIABETES MELLITUS, TYPE II 08/21/2006   Diabetic osteomyelitis ([place]) 05/29/2015   Fatty liver    Fracture of 5th metatarsal    non union   GERD 27/74/1287   Had fundoplication   GOUT 86/76/7209   Hammer toe of right foot    2-5th toes   Hx of umbilical hernia repair    HYPERLIPIDEMIA 08/21/2006   HYPERTENSION 08/21/2006   Infection of wound due to methicillin resistant Staphylococcus aureus (MRSA)    Internal hemorrhoids    Kidney problem    Multiple allergies 10/14/2016   OBESITY 06/04/2009   Onychomycosis 10/27/2015   Osteomyelitis of left foot ([place]) 05/29/2015   Osteoporosis    Pulmonary sarcoidosis ([place])    Followed locally by pulmonology, but also by Dr. Casper Harrison at [facility]   Right knee pain 01/26/2017   Vocal cord dysfunction    Wears partial dentures    Patient Active Problem List   Diagnosis Date  Noted   Chronic pain of left wrist 04/19/2022   Preop cardiovascular exam 12/07/2021   Facet arthropathy, cervical 06/29/2021   Foraminal stenosis of cervical region 06/29/2021   Class II obesity 02/02/2021   Iron deficiency anemia 10/01/2020   Postural dizziness with presyncope 09/30/2020   Hypertriglyceridemia 04/02/2020   Aortic atherosclerosis ([place]) 02/14/2020   Nontraumatic complete tear of left rotator cuff    Diabetic neuropathy ([place]) 02/20/2019   Severe persistent asthma 01/04/2019   Subcortical microvascular ischemic occlusive disease 07/12/2018   Rapid palpitations 07/12/2018   Impingement of left ankle joint 06/26/2018   Constipation 04/21/2018   Total knee replacement status, left 12/07/2017   Dysphagia 09/20/2017   Epiglottitis    Plantar fasciitis, right 07/13/2017   S/P total knee arthroplasty, right 06/29/2017   Osteoarthrosis, localized, primary, knee, right    Sprain of calcaneofibular ligament of right ankle 06/06/2017   Osteoarthritis of right knee 01/26/2017   Onychomycosis 10/27/2015   CKD (chronic kidney disease) stage 3, GFR 30-59 ml/min (HCC) 04/27/2015   MRSA (methicillin resistant staph aureus) culture positive 03/27/2015   History of osteomyelitis 03/27/2015   Fatty liver 09/30/2014   Hot flashes 07/15/2014   Abnormal SPEP 04/17/2014   Fracture of left leg 04/17/2014   Cushingoid side effect of steroids ([place]) 04/17/2014   Internal hemorrhoids    Pulmonary sarcoidosis ([place])    Major depression in full  remission ([place])    Preoperative clearance 03/25/2014   Obstructive chronic bronchitis without exacerbation ([place]) 09/18/2013   Chest pain 04/11/2013   Hypertensive heart disease with chronic diastolic congestive heart failure ([place])    Solitary pulmonary nodule, on CT 02/2013 - stable over 2 years in 2015 02/20/2013   Gout 08/20/2010   Deficiency anemia 09/18/2009   Sleep apnea 04/21/2009   Sarcoidosis of lung ([place]) 04/10/2007   Hyperlipidemia  associated with type 2 diabetes mellitus ([place]) 08/21/2006   Hypertension associated with diabetes ([place]) 08/21/2006   GERD 08/21/2006   Type II diabetes mellitus with renal manifestations ([place]) 08/21/2006   Uncontrolled type 2 diabetes mellitus with hyperglycemia ([place]) 08/21/2006   Home Medication(s) Prior to Admission medications   Medication Sig Start Date End Date Taking? Authorizing Provider  albuterol (PROVENTIL HFA;VENTOLIN HFA) 108 (90 Base) MCG/ACT inhaler Inhale 1-2 puffs into the lungs every 6 (six) hours as needed for wheezing or shortness of breath. 01/04/19   [name], MD  albuterol (PROVENTIL) (2.5 MG/3ML) 0.083% nebulizer solution USE 1 VIAL VIA NEBULIZXER EVERY 6 HOURS AS NEEDED FOR WHEEZING OR SHORTNESS OF BREATH    [provider]  allopurinol (ZYLOPRIM) 300 MG tablet TAKE 1 TABLET BY MOUTH EVERY DAY 12/17/21   Aundra Dubin, PA-C  amoxicillin-clavulanate (AUGMENTIN) 875-125 MG tablet Take 1 tablet by mouth 2 (two) times daily. 04/28/22   Tawnya Crook, MD  aspirin EC 81 MG tablet Take 1 tablet (81 mg total) by mouth daily. 11/03/20   Leonie Man, MD  atorvastatin (LIPITOR) 40 MG tablet TAKE 1 TABLET BY MOUTH EVERY DAY 08/31/21   [name], MD  benzonatate (TESSALON PERLES) 100 MG capsule Take 2 capsules (200 mg total) by mouth 3 (three) times daily as needed for cough. 05/25/22   Jeanie Sewer, NP  BREO ELLIPTA 200-25 MCG/INH AEPB TAKE 1 PUFF BY MOUTH EVERY DAY 06/04/19   [name], MD  buPROPion (WELLBUTRIN XL) 300 MG 24 hr tablet TAKE 1 TABLET BY MOUTH EVERY DAY 01/04/22   [name], MD  carvedilol (COREG) 25 MG tablet Take 1 tablet by mouth 2 (two) times daily with a meal.    [provider]  Cholecalciferol 25 MCG (1000 UT) capsule Take 1,000 Units by mouth in the morning and at bedtime. 10/26/11   [provider]  Continuous Blood Gluc Sensor (FREESTYLE LIBRE 3 SENSOR) MISC 1 Device by Does not apply route  every 14 (fourteen) days. Apply 1 sensor on upper arm every 14 days for continuous glucose monitoring 05/03/22   Elayne Snare, MD  diclofenac Sodium (VOLTAREN) 1 % GEL Apply 1 application topically as needed (pain.).     [provider]  diltiazem (CARDIZEM CD) 120 MG 24 hr capsule TAKE 1 CAPSULE BY MOUTH EVERY DAY 01/28/22   Leonie Man, MD  Dulaglutide (TRULICITY) 4.5 WU/9.8JX SOPN Inject 4.5 mg as directed once a week. 02/02/22   Renato Shin, MD  famotidine (PEPCID) 20 MG tablet Take 1 tablet (20 mg total) by mouth at bedtime. 12/13/18   Willia Craze, NP  fenofibrate 160 MG tablet TAKE 1 TABLET BY MOUTH EVERY DAY 01/04/22   Leonie Man, MD  fluticasone [facility]) 50 MCG/ACT nasal spray Place 2 sprays into both nostrils daily as needed for allergies. 01/22/20   [name], MD  furosemide (LASIX) 40 MG tablet TAKE 1 TABLET BY MOUTH TWICE A DAY 12/17/21   [name], MD  gabapentin (NEURONTIN) 100 MG capsule TAKE 1 CAPSULE BY MOUTH THREE TIMES A DAY 02/02/22   [name], MD  gabapentin (NEURONTIN) 300 MG capsule Take at night in addition to the '100mg'$  gabapentin to get '400mg'$  at night and '100mg'$  in the morning and at mid day. 03/29/22   Gregor Hams, MD  glucose blood (CONTOUR NEXT TEST) test strip 1 each by Other route 4 (four) times daily. And lancets 4/day 09/19/19   Renato Shin, MD  HYDROcodone-acetaminophen (NORCO/VICODIN) 5-325 MG tablet Take 1 tablet by mouth every 6 (six) hours as needed. 03/29/22   Gregor Hams, MD  insulin aspart (NOVOLOG FLEXPEN) 100 UNIT/ML FlexPen Inject 15 Units into the skin daily with supper. 11/06/21   Renato Shin, MD  insulin glargine (LANTUS SOLOSTAR) 100 UNIT/ML Solostar Pen Inject 16 Units into the skin every morning. 07/30/21   Renato Shin, MD  Insulin Pen Needle (PEN NEEDLES) 32G X 4 MM MISC 1 each by Does not apply route 4 (four) times daily. E11.9 02/22/20   Renato Shin, MD  ipratropium (ATROVENT) 0.03 % nasal spray PLACE  2 SPRAYS INTO BOTH NOSTRILS EVERY 12 (TWELVE) HOURS. 03/11/21   [name], MD  KLOR-CON M20 20 MEQ tablet TAKE 1 & 1/2 TABLETS BY MOUTH TWICE A DAY 01/27/22   [name], MD  lactulose (CHRONULAC) 10 GM/15ML solution TAKE 15 MLS BY MOUTH DAILY AS NEEDED FOR CONSTIPATION. 10/07/20   [name], MD  Lancet Devices (EASY MINI EJECT LANCING DEVICE) MISC Use as directed 4 times daily to test blood sugar 11/04/20   Renato Shin, MD  LINZESS 290 MCG CAPS capsule TAKE 1 CAPSULE BY MOUTH DAILY BEFORE BREAKFAST. 09/07/21   Irene Shipper, MD  LORazepam (ATIVAN) 0.5 MG tablet Take 0.5 mg by mouth every 12 (twelve) hours as needed for anxiety.    [provider]  meloxicam (MOBIC) 15 MG tablet Take 1 tablet (15 mg total) by mouth daily. 06/24/21   Marybelle Killings, MD  metFORMIN (GLUCOPHAGE-XR) 500 MG 24 hr tablet Take 4 tablets (2,000 mg total) by mouth daily with breakfast. 05/05/22   Elayne Snare, MD  metoCLOPramide (REGLAN) 10 MG tablet Take 1 tablet (10 mg total) by mouth every 6 (six) hours. 10/16/08   Delora Fuel, MD  Microlet Lancets MISC 1 each by Does not apply route 2 (two) times daily. E11.9 09/19/19   Renato Shin, MD  nitroGLYCERIN (NITROSTAT) 0.4 MG SL tablet Place 1 tablet (0.4 mg total) under the tongue every 5 (five) minutes as needed for chest pain. Reported on 01/05/2016 01/10/18   Leonie Man, MD  nystatin (MYCOSTATIN) 100000 UNIT/ML suspension nystatin 100,000 unit/mL oral suspension  SWISH, GARGLE, AND SPIT 20 ML 4 TIMES A DAY UNTIL RESOLVED    [provider]  ondansetron (ZOFRAN-ODT) 4 MG disintegrating tablet Take 1 tablet (4 mg total) by mouth every 8 (eight) hours as needed for nausea or vomiting. 11/17/21   Renato Shin, MD  oxyCODONE-acetaminophen (PERCOCET/ROXICET) 5-325 MG tablet Take 1 tablet by mouth every 4 (four) hours as needed. 12/10/21   [provider]  pantoprazole (PROTONIX) 40 MG tablet Take 1 tablet (40 mg total) by mouth daily.  04/29/22   [name], MD  phenazopyridine (PYRIDIUM) 100 MG tablet Take 1 tablet (100 mg total) by mouth 3 (three) times daily as needed for pain. 01/13/22   Tawnya Crook, MD  telmisartan (MICARDIS) 20 MG tablet TAKE 1 TABLET BY MOUTH EVERY  DAY 01/04/22   [name], MD  traMADol (ULTRAM) 50 MG tablet TAKE 1 TABLET BY MOUTH EVERY 12 HOURS AS NEEDED FOR MODERATE TO SEVERE PAIN    [provider]  triamcinolone cream (KENALOG) 0.1 % APPLY TOPICALLY TO RASH 2 TIMES DAILY AS NEEDED. STOP USE AFTER 7-10 DAYS    [provider]  venlafaxine XR (EFFEXOR-XR) 75 MG 24 hr capsule TAKE 1 CAPSULE BY MOUTH TWICE A DAY 01/28/22   [name], MD  mupirocin nasal ointment (BACTROBAN) 2 % Place 1 application into the nose 2 (two) times daily. Use one-half of tube in each nostril twice daily for five (5) days. After application, press sides of nose together and gently massage. 03/28/18 03/28/18  [name], MD                                                                                                                                    Past Surgical History Past Surgical History:  Procedure Laterality Date   ABDOMINAL HYSTERECTOMY     APPENDECTOMY     BLADDER SUSPENSION  11/11/2011   Procedure: TRANSVAGINAL TAPE (TVT) PROCEDURE;  Surgeon: Olga Millers, MD;  Location: [facility];  Service: Gynecology;  Laterality: N/A;   CAROTIDS  02/18/2011   CAROTID DUPLEX; VERTEBRALS ARE PATENT WITH ANTEGRADE FLOW. ICA/CCA RATIO 1.61 ON RIGHT AND 0.75 ON LEFT   CATARACT EXTRACTION, BILATERAL     CHOLECYSTECTOMY  1984   COLONOSCOPY  04/29/2010   [name]   CYSTOSCOPY  11/11/2011   Procedure: CYSTOSCOPY;  Surgeon: Olga Millers, MD;  Location: [facility];  Service: Gynecology;  Laterality: N/A;   EXTUBATION (ENDOTRACHEAL) IN OR N/A 09/23/2017   Procedure: EXTUBATION (ENDOTRACHEAL) IN OR;  Surgeon: Helayne Seminole, MD;  Location: [facility];  Service: ENT;  Laterality: N/A;   FIBEROPTIC  LARYNGOSCOPY AND TRACHEOSCOPY N/A 09/23/2017   Procedure: FLEXIBLE FIBEROPTIC LARYNGOSCOPY;  Surgeon: Helayne Seminole, MD;  Location: [facility];  Service: ENT;  Laterality: N/A;   FRACTURE SURGERY     foot   [place] Left 10/02/2018   Procedure: HALLUX ARTHRODESIS;  Surgeon: Edrick Kins, DPM;  Location: [facility];  Service: Podiatry;  Laterality: Left;   HAMMER TOE SURGERY Left 10/02/2018   Procedure: HAMMER TOE CORRECTION 2ND 3RD 4RD FIFTH TOE;  Surgeon: Edrick Kins, DPM;  Location: [facility];  Service: Podiatry;  Laterality: Left;   HAMMER TOE SURGERY Right 04/12/2019   Procedure: HAMMER TOE CORRECTION, 2ND, 3RD, 4TH AND 5TH TOES OF RIGHT FOOT;  Surgeon: Edrick Kins, DPM;  Location: [facility];  Service: Podiatry;  Laterality: Right;   HAMMER TOE SURGERY Left 10/25/2019   Procedure: HAMMER TOE REPAIR SECOND, THIRD, FOUTH;  Surgeon: Edrick Kins, DPM;  Location: [facility];  Service: Podiatry;  Laterality: Left;   HERNIA REPAIR     I & D EXTREMITY Left 06/27/2015  Procedure: Partial Excision Left Calcaneus, Place Antibiotic Beads, and Wound VAC;  Surgeon: Newt Minion, MD;  Location: [facility];  Service: Orthopedics;  Laterality: Left;   JOINT REPLACEMENT     KNEE ARTHROSCOPY     right   LEFT AND RIGHT HEART CATHETERIZATION WITH CORONARY ANGIOGRAM N/A 04/23/2013   Procedure: LEFT AND RIGHT HEART CATHETERIZATION WITH CORONARY ANGIOGRAM;  Surgeon: Leonie Man, MD;  Location: [facility];  Service: Cardiovascular;  Laterality: N/A;   LEFT HEART CATH AND CORONARY ANGIOGRAPHY N/A 03/11/2017   Procedure: Left Heart Cath and Coronary Angiography;  Surgeon: Sherren Mocha, MD;  Location: [facility]; angiographically minimal CAD in the LAD otherwise normal.;  Normal LVEDP.  FALSE POSITIVE MYOVIEW   LEFT HEART CATH AND CORONARY ANGIOGRAPHY  07/20/2010   LVEF 50-55% WITH VERY MILD GLOBAL HYPOKINESIA; ESSENTIALLY NORMAL CORONARY ARTERIES; NORMAL LV FUNCTION   METATARSAL OSTEOTOMY WITH OPEN  REDUCTION INTERNAL FIXATION (ORIF) METATARSAL WITH FUSION Left 04/09/2014   Procedure: LEFT FOOT FRACTURE OPEN TREATMENT METATARSAL INCLUDES INTERNAL FIXATION EACH;  Surgeon: Lorn Junes, MD;  Location: [facility];  Service: Orthopedics;  Laterality: Left;   NISSEN FUNDOPLICATION  4665   NM MYOVIEW LTD --> FALSE POSITIVE  03/10/2017   : Moderate size "stress-induced "perfusion defect at the apex as well as "ill-defined stress-induced perfusion defect in the lateral wall.  EF 55%.  INTERMEDIATE risk. -->  FALSE POSITIVE   ORIF DISTAL FEMUR FRACTURE  08/08/2020   [facility]: ORIF of left extra-articular periprosthetic distal femur fracture with synthesis of the femur plate with cortical nonlocking screws.  (Thought to be pathologic fracture due to osteoporosis) -> after a fall   ORIF FEMUR W/ PERI-IMPLANT  08/29/2020   [facility]) revision of left femur fracture ORIF plate fixation screws; culture positive for MSSA   Right and left CARDIAC CATHETERIZATION  04/23/2013   Angiographic normal coronaries; LVEDP 20 mmHg, PCWP 12-14 mmHg, RAP 12 mmHg.; Fick CO/CI 4.9/2.2   ROTATOR CUFF REPAIR Right 12/09/2021   SHOULDER ARTHROSCOPY Left 03/14/2019   Procedure: LEFT SHOULDER ARTHROSCOPY, DEBRIDEMENT, AND DECOMPRESSION;  Surgeon: Newt Minion, MD;  Location: [facility];  Service: Orthopedics;  Laterality: Left;   TOTAL KNEE ARTHROPLASTY Right 06/29/2017   Procedure: RIGHT TOTAL KNEE ARTHROPLASTY;  Surgeon: Newt Minion, MD;  Location: [facility];  Service: Orthopedics;  Laterality: Right;   TOTAL KNEE ARTHROPLASTY Left 12/07/2017   TOTAL KNEE ARTHROPLASTY Left 12/07/2017   Procedure: LEFT TOTAL KNEE ARTHROPLASTY;  Surgeon: Newt Minion, MD;  Location: [place] [facility];  Service: Orthopedics;  Laterality: Left;   TRACHEOSTOMY TUBE PLACEMENT N/A 09/20/2017   Procedure: AWAKE INTUBATION WITH ANESTHESIA WITH VIDEO ASSISTANCE;  Surgeon: Helayne Seminole, MD;  Location: [facility];   Service: ENT;  Laterality: N/A;   TRANSTHORACIC ECHOCARDIOGRAM  08/2014   Normal LV size and function.  Mild LVH.  EF 55-60%.  Normal regional wall motion.  GR 1 DD.  Normal RV size and function .   TRANSTHORACIC ECHOCARDIOGRAM  08/12/2020    [facility]): Normal LV size and function.  EF 55 to 60%.  No RWM A.  Normal filling pattern.  Normal RV.  No significant valvular disease-stenosis or regurgitation.  No vegetation.   TUBAL LIGATION     with reversal in 1994   UPPER GASTROINTESTINAL ENDOSCOPY     VENTRAL HERNIA REPAIR     Family History Family History  Problem Relation Age  of Onset   Diabetes Father    Heart attack Father 72   Coronary artery disease Father    Heart failure Father    Throat cancer Father    Hypertension Father    Heart disease Father    Sleep apnea Father    Obesity Father    COPD Mother    Emphysema Mother    Asthma Mother    Heart failure Mother    Breast cancer Mother    Diabetes Mother    Kidney disease Mother    Thyroid disease Mother    Heart attack Maternal Grandfather    Sarcoidosis Maternal Uncle    Lung cancer Brother    Diabetes Brother    Colon cancer Neg Hx    Colon polyps Neg Hx    Esophageal cancer Neg Hx    Rectal cancer Neg Hx    Stomach cancer Neg Hx     Social History Social History   Tobacco Use   Smoking status: Never   Smokeless tobacco: Never  Vaping Use   Vaping Use: Never used  Substance Use Topics   Alcohol use: No    Alcohol/week: 0.0 standard drinks of alcohol   Drug use: No   Allergies Methotrexate, Vancomycin, Lisinopril, Chlorhexidine, Clindamycin/lincomycin, Lincomycin, and Teflaro [ceftaroline]  Review of Systems Review of Systems  All other systems reviewed and are negative.   Physical Exam Vital Signs  I have reviewed the triage vital signs BP (!) 154/78 (BP Location: Right Arm)   Pulse 90   Temp 98.6 F (37 C)   Resp (!) 25   Ht '5\' 6"'$  (1.676 m)   Wt 100.8 kg   SpO2 99%   BMI  35.87 kg/m  Physical Exam Vitals and nursing note reviewed.  Constitutional:      General: She is not in acute distress.    Appearance: She is well-developed.  HENT:     Head: Normocephalic and atraumatic.     Mouth/Throat:     Mouth: Mucous membranes are moist.  Eyes:     Pupils: Pupils are equal, round, and reactive to light.  Cardiovascular:     Rate and Rhythm: Normal rate and regular rhythm.     Heart sounds: No murmur heard. Pulmonary:     Effort: No respiratory distress.     Comments: Upper airway wheezing, no wheezing on pulmonary exam but externally has wheezing sounds.  Mildly increased work of breathing Abdominal:     General: Abdomen is flat.     Palpations: Abdomen is soft.     Tenderness: There is no abdominal tenderness.  Musculoskeletal:        General: No tenderness.     Right lower leg: No edema.     Left lower leg: No edema.  Skin:    General: Skin is warm and dry.  Neurological:     General: No focal deficit present.     Mental Status: She is alert. Mental status is at baseline.  Psychiatric:        Mood and Affect: Mood normal.        Behavior: Behavior normal.     ED Results and Treatments Labs (all labs ordered are listed, but only abnormal results are displayed) Labs Reviewed  BASIC METABOLIC PANEL - Abnormal; Notable for the following components:      Result Value   Glucose, Bld 191 (*)    Creatinine, Ser 1.21 (*)    GFR, Estimated 51 (*)  All other components within normal limits  CBC - Abnormal; Notable for the following components:   WBC 13.5 (*)    RBC 3.53 (*)    Hemoglobin 10.7 (*)    HCT 32.6 (*)    All other components within normal limits  TROPONIN I (HIGH SENSITIVITY)  TROPONIN I (HIGH SENSITIVITY)                                                                                                                          Radiology CT Soft Tissue Neck W Contrast  Result Date: 06/05/2022 CLINICAL DATA:  Cough with abnormal  breath sounds EXAM: CT NECK WITH CONTRAST TECHNIQUE: Multidetector CT imaging of the neck was performed using the standard protocol following the bolus administration of intravenous contrast. RADIATION DOSE REDUCTION: This exam was performed according to the departmental dose-optimization program which includes automated exposure control, adjustment of the mA and/or kV according to patient size and/or use of iterative reconstruction technique. CONTRAST:  113m OMNIPAQUE IOHEXOL 350 MG/ML SOLN COMPARISON:  09/20/2017 FINDINGS: PHARYNX AND LARYNX: The pharynx and epiglottis are normal. Narrowing of the laryngeal airway is favored to be due to phonation. The vocal cords do not appear edematous. SALIVARY GLANDS: Normal parotid, submandibular and sublingual glands. THYROID: Normal. LYMPH NODES: No enlarged or abnormal density lymph nodes. VASCULAR: Major cervical vessels are patent. LIMITED INTRACRANIAL: Normal. VISUALIZED ORBITS: Normal. MASTOIDS AND VISUALIZED PARANASAL SINUSES: No fluid levels or advanced mucosal thickening. No mastoid effusion. SKELETON: No bony spinal canal stenosis. No lytic or blastic lesions. UPPER CHEST: Clear. OTHER: None. IMPRESSION: 1. No acute abnormality of the neck. 2. Narrowing of the laryngeal airway is favored to be incidental, possibly phonation. If there is significant respiratory stridor, consider direct laryngoscopic assessment. Electronically Signed   By: KUlyses JarredM.D.   On: 06/05/2022 22:13   CT Angio Chest PE W and/or Wo Contrast  Result Date: 06/05/2022 CLINICAL DATA:  Cough. EXAM: CT ANGIOGRAPHY CHEST WITH CONTRAST TECHNIQUE: Multidetector CT imaging of the chest was performed using the standard protocol during bolus administration of intravenous contrast. Multiplanar CT image reconstructions and MIPs were obtained to evaluate the vascular anatomy. RADIATION DOSE REDUCTION: This exam was performed according to the departmental dose-optimization program which includes  automated exposure control, adjustment of the mA and/or kV according to patient size and/or use of iterative reconstruction technique. CONTRAST:  1069mOMNIPAQUE IOHEXOL 350 MG/ML SOLN COMPARISON:  CT chest 01/24/2014 FINDINGS: Cardiovascular: Heart is enlarged. Aorta is normal in size. There is no pericardial effusion. There are atherosclerotic calcifications of the aorta and coronary arteries. There is adequate opacification of the pulmonary arteries to the segmental level. There is no pulmonary embolism identified. Main pulmonary artery is enlarged. Mediastinum/Nodes: No enlarged mediastinal, hilar, or axillary lymph nodes. Thyroid gland, trachea, and esophagus demonstrate no significant findings. Lungs/Pleura: Lungs are clear. No pleural effusion or pneumothorax. Upper Abdomen: There surgical clips in the proximal stomach. Musculoskeletal: There are healed right-sided rib fractures. No  acute fractures are seen. Review of the MIP images confirms the above findings. IMPRESSION: 1. No evidence for pulmonary embolism. 2. Cardiomegaly. Enlarged main pulmonary artery can be seen with pulmonary artery hypertension. Electronically Signed   By: Ronney Asters M.D.   On: 06/05/2022 21:59   DG Chest Port 1 View  Result Date: 06/05/2022 CLINICAL DATA:  Nonproductive cough EXAM: PORTABLE CHEST 1 VIEW COMPARISON:  12/03/2021 FINDINGS: Heart and mediastinal contours are within normal limits. No focal opacities or effusions. No acute bony abnormality. IMPRESSION: No active disease. Electronically Signed   By: Rolm Baptise M.D.   On: 06/05/2022 20:13    Pertinent labs & imaging results that were available during my care of the patient were reviewed by me and considered in my medical decision making (see MDM for details).  Medications Ordered in ED Medications  ipratropium-albuterol (DUONEB) 0.5-2.5 (3) MG/3ML nebulizer solution (3 mLs  Given 06/05/22 1840)  albuterol (PROVENTIL) (2.5 MG/3ML) 0.083% nebulizer solution  (2.5 mg  Given 06/05/22 1840)  dexamethasone (DECADRON) injection 10 mg (10 mg Intravenous Given 06/05/22 2145)  LORazepam (ATIVAN) injection 0.5 mg (0.5 mg Intravenous Given 06/05/22 2145)  iohexol (OMNIPAQUE) 350 MG/ML injection 100 mL (100 mLs Intravenous Contrast Given 06/05/22 2125)  Ampicillin-Sulbactam (UNASYN) 3 g in sodium chloride 0.9 % 100 mL IVPB (3 g Intravenous New Bag/Given 06/05/22 2353)                                                                                                                                     Procedures Procedures  (including critical care time)  Medical Decision Making / ED Course   MDM:  61 year old female presenting to the emergency department with cough, wheezing.  Patient well-appearing, mildly increased work of breathing, has wheezing on exam which appears to be upper airway lung sounds transmitted downward.  Lungs are clear.  Symptoms did not improve with breathing treatment in the emergency department.  Given  history of vocal cord spasm, suspect this is possibly the most likely cause, will treat with steroids, will also obtain CT neck to evaluate for epiglottitis or deep space infection of the lower concern.  Given chest pain, shortness of breath, symptoms could also be related to pulmonary embolism so will obtain CT scan, patient has history of sarcoidosis which may also be contributing to her symptoms, she is currently off steroids.  Doubt ACS, symptoms atypical, EKG not concerning for ACS, troponin negative x2.  Will reassess.  Clinical Course as of 06/06/22 0025  Sat Jun 05, 2022  2332 Discussed with Dr. Janace Hoard, agrees with in person evaluation and laryngoscopy, will see patient when they arrive [facility] [WS]  Sun Jun 06, 2022  0023 Patient accepted as ED To ED transfer to [facility] by Dr. Betsey Holiday. CT neck shows laryngeal narrowing. With mild stridor and history of epiglottitis gave unasyn and steroids. ENT will see patient once they arrive.  [WS]  Clinical Course User Index [WS] Cristie Hem, MD     Additional history obtained: -Additional history obtained from husband -External records from outside source obtained and reviewed including: Chart review including previous notes, labs, imaging, consultation notes   Lab Tests: -I ordered, reviewed, and interpreted labs.   The pertinent results include:   Labs Reviewed  BASIC METABOLIC PANEL - Abnormal; Notable for the following components:      Result Value   Glucose, Bld 191 (*)    Creatinine, Ser 1.21 (*)    GFR, Estimated 51 (*)    All other components within normal limits  CBC - Abnormal; Notable for the following components:   WBC 13.5 (*)    RBC 3.53 (*)    Hemoglobin 10.7 (*)    HCT 32.6 (*)    All other components within normal limits  TROPONIN I (HIGH SENSITIVITY)  TROPONIN I (HIGH SENSITIVITY)      EKG   EKG Interpretation  Date/Time:  Saturday June 05 2022 18:40:31 EDT Ventricular Rate:  93 PR Interval:  154 QRS Duration: 84 QT Interval:  334 QTC Calculation: 415 R Axis:   73 Text Interpretation: Normal sinus rhythm Cannot rule out Anterior infarct When compared to Compared to previous tracing No significant change since Confirmed by Garnette Gunner [phone]) on 06/05/2022 9:13:50 PM         Imaging Studies ordered: I ordered imaging studies including CTA chest, CT neck I independently visualized and interpreted imaging. I agree with the radiologist interpretation   Medicines ordered and prescription drug management: Meds ordered this encounter  Medications   ipratropium-albuterol (DUONEB) 0.5-2.5 (3) MG/3ML nebulizer solution    Florene Glen, Robin F: cabinet override   albuterol (PROVENTIL) (2.5 MG/3ML) 0.083% nebulizer solution    Neomia Glass F: cabinet override   dexamethasone (DECADRON) injection 10 mg   LORazepam (ATIVAN) injection 0.5 mg   iohexol (OMNIPAQUE) 350 MG/ML injection 100 mL   Ampicillin-Sulbactam (UNASYN) 3 g in  sodium chloride 0.9 % 100 mL IVPB    Order Specific Question:   Antibiotic Indication:    Answer:   Aspiration Pneumonia    -I have reviewed the patients home medicines and have made adjustments as needed    Consultations Obtained: I requested consultation with the ENT,  and discussed lab and imaging findings as well as pertinent plan - they recommend: transfer to [facility] for direct laryngoscopy   Cardiac Monitoring: The patient was maintained on a cardiac monitor.  I personally viewed and interpreted the cardiac monitored which showed an underlying rhythm of: NSR    Reevaluation: After the interventions noted above, I reevaluated the patient and found that they have :improved  Co morbidities that complicate the patient evaluation  Past Medical History:  Diagnosis Date   Abnormal SPEP 04/17/2014   Acute left ankle pain 01/26/2017   ANEMIA-UNSPECIFIED 09/18/2009   Anxiety    Arthritis    Blood transfusion without reported diagnosis    Cataract    CHF (congestive heart failure) (HCC)    Chronic diastolic heart failure, NYHA class 2 ([place])    Normal LVEDP by May 2018   COPD (chronic obstructive pulmonary disease) ([place])    Depression    DIABETES MELLITUS, TYPE II 08/21/2006   Diabetic osteomyelitis ([place]) 05/29/2015   Fatty liver    Fracture of 5th metatarsal    non union   GERD 92/08/9416   Had fundoplication   GOUT 40/81/4481   Hammer toe of right foot  2-5th toes   Hx of umbilical hernia repair    HYPERLIPIDEMIA 08/21/2006   HYPERTENSION 08/21/2006   Infection of wound due to methicillin resistant Staphylococcus aureus (MRSA)    Internal hemorrhoids    Kidney problem    Multiple allergies 10/14/2016   OBESITY 06/04/2009   Onychomycosis 10/27/2015   Osteomyelitis of left foot ([place]) 05/29/2015   Osteoporosis    Pulmonary sarcoidosis ([place])    Followed locally by pulmonology, but also by Dr. Casper Harrison at [facility]   Right knee pain 01/26/2017   Vocal cord  dysfunction    Wears partial dentures       Dispostion: Transfer to [facility] ER     Final Clinical Impression(s) / ED Diagnoses Final diagnoses:  Stridor     This chart was dictated using voice recognition software.  Despite best efforts to proofread,  errors can occur which can change the documentation meaning.    Cristie Hem, MD 06/06/22 Laureen Abrahams

## 2022-06-05 NOTE — ED Triage Notes (Signed)
Patient here POV from Home  Endorses Non-Productive Cough for approximately 3 Weeks. Endorses SOB more recently with Wheezing.  Finished Z-Pack Prescription from PCP Recently. No Fevers. Some Chest Discomfort.   NAD Noted during Triage. A&Ox4. GCS 15. Ambulatory.

## 2022-06-05 NOTE — Progress Notes (Signed)
Pt has a strong non productive cough and neck wheezes. The Pt's lung sounded clear. She also takes 40 of lasix a day. RT will continue to monitor

## 2022-06-05 NOTE — ED Notes (Signed)
Pt ambulated to bathroom. Gait steady.

## 2022-06-06 ENCOUNTER — Encounter (HOSPITAL_COMMUNITY): Payer: Self-pay | Admitting: Family Medicine

## 2022-06-06 DIAGNOSIS — Z9841 Cataract extraction status, right eye: Secondary | ICD-10-CM | POA: Diagnosis not present

## 2022-06-06 DIAGNOSIS — Z20822 Contact with and (suspected) exposure to covid-19: Secondary | ICD-10-CM | POA: Diagnosis not present

## 2022-06-06 DIAGNOSIS — K219 Gastro-esophageal reflux disease without esophagitis: Secondary | ICD-10-CM | POA: Diagnosis not present

## 2022-06-06 DIAGNOSIS — N1831 Chronic kidney disease, stage 3a: Secondary | ICD-10-CM | POA: Diagnosis not present

## 2022-06-06 DIAGNOSIS — F3342 Major depressive disorder, recurrent, in full remission: Secondary | ICD-10-CM

## 2022-06-06 DIAGNOSIS — E785 Hyperlipidemia, unspecified: Secondary | ICD-10-CM | POA: Diagnosis not present

## 2022-06-06 DIAGNOSIS — J42 Unspecified chronic bronchitis: Secondary | ICD-10-CM

## 2022-06-06 DIAGNOSIS — E669 Obesity, unspecified: Secondary | ICD-10-CM | POA: Diagnosis not present

## 2022-06-06 DIAGNOSIS — Z888 Allergy status to other drugs, medicaments and biological substances status: Secondary | ICD-10-CM | POA: Diagnosis not present

## 2022-06-06 DIAGNOSIS — K760 Fatty (change of) liver, not elsewhere classified: Secondary | ICD-10-CM | POA: Diagnosis not present

## 2022-06-06 DIAGNOSIS — E1140 Type 2 diabetes mellitus with diabetic neuropathy, unspecified: Secondary | ICD-10-CM | POA: Diagnosis not present

## 2022-06-06 DIAGNOSIS — J209 Acute bronchitis, unspecified: Secondary | ICD-10-CM | POA: Diagnosis not present

## 2022-06-06 DIAGNOSIS — E1122 Type 2 diabetes mellitus with diabetic chronic kidney disease: Secondary | ICD-10-CM

## 2022-06-06 DIAGNOSIS — I272 Pulmonary hypertension, unspecified: Secondary | ICD-10-CM | POA: Diagnosis not present

## 2022-06-06 DIAGNOSIS — Z794 Long term (current) use of insulin: Secondary | ICD-10-CM | POA: Diagnosis not present

## 2022-06-06 DIAGNOSIS — Z96653 Presence of artificial knee joint, bilateral: Secondary | ICD-10-CM | POA: Diagnosis not present

## 2022-06-06 DIAGNOSIS — Z6835 Body mass index (BMI) 35.0-35.9, adult: Secondary | ICD-10-CM | POA: Diagnosis not present

## 2022-06-06 DIAGNOSIS — R061 Stridor: Secondary | ICD-10-CM | POA: Diagnosis not present

## 2022-06-06 DIAGNOSIS — J441 Chronic obstructive pulmonary disease with (acute) exacerbation: Secondary | ICD-10-CM | POA: Diagnosis present

## 2022-06-06 DIAGNOSIS — F419 Anxiety disorder, unspecified: Secondary | ICD-10-CM | POA: Diagnosis not present

## 2022-06-06 DIAGNOSIS — Z9842 Cataract extraction status, left eye: Secondary | ICD-10-CM | POA: Diagnosis not present

## 2022-06-06 DIAGNOSIS — I3139 Other pericardial effusion (noninflammatory): Secondary | ICD-10-CM | POA: Diagnosis not present

## 2022-06-06 DIAGNOSIS — Z981 Arthrodesis status: Secondary | ICD-10-CM | POA: Diagnosis not present

## 2022-06-06 DIAGNOSIS — R0602 Shortness of breath: Secondary | ICD-10-CM

## 2022-06-06 DIAGNOSIS — I130 Hypertensive heart and chronic kidney disease with heart failure and stage 1 through stage 4 chronic kidney disease, or unspecified chronic kidney disease: Secondary | ICD-10-CM | POA: Diagnosis not present

## 2022-06-06 DIAGNOSIS — J440 Chronic obstructive pulmonary disease with acute lower respiratory infection: Secondary | ICD-10-CM | POA: Diagnosis not present

## 2022-06-06 DIAGNOSIS — I5032 Chronic diastolic (congestive) heart failure: Secondary | ICD-10-CM | POA: Diagnosis not present

## 2022-06-06 DIAGNOSIS — E1159 Type 2 diabetes mellitus with other circulatory complications: Secondary | ICD-10-CM

## 2022-06-06 DIAGNOSIS — I152 Hypertension secondary to endocrine disorders: Secondary | ICD-10-CM

## 2022-06-06 DIAGNOSIS — M109 Gout, unspecified: Secondary | ICD-10-CM | POA: Diagnosis not present

## 2022-06-06 LAB — RESP PANEL BY RT-PCR (FLU A&B, COVID) ARPGX2
Influenza A by PCR: NEGATIVE
Influenza B by PCR: NEGATIVE
SARS Coronavirus 2 by RT PCR: NEGATIVE

## 2022-06-06 LAB — CBG MONITORING, ED
Glucose-Capillary: 261 mg/dL — ABNORMAL HIGH (ref 70–99)
Glucose-Capillary: 238 mg/dL — ABNORMAL HIGH (ref 70–99)
Glucose-Capillary: 322 mg/dL — ABNORMAL HIGH (ref 70–99)

## 2022-06-06 LAB — HIV ANTIBODY (ROUTINE TESTING W REFLEX): HIV Screen 4th Generation wRfx: NONREACTIVE

## 2022-06-06 LAB — CBC
HCT: 33 % — ABNORMAL LOW (ref 36.0–46.0)
Hemoglobin: 10.8 g/dL — ABNORMAL LOW (ref 12.0–15.0)
MCH: 30.9 pg (ref 26.0–34.0)
MCHC: 32.7 g/dL (ref 30.0–36.0)
MCV: 94.3 fL (ref 80.0–100.0)
Platelets: 339 10*3/uL (ref 150–400)
RBC: 3.5 MIL/uL — ABNORMAL LOW (ref 3.87–5.11)
RDW: 13.3 % (ref 11.5–15.5)
WBC: 9.8 10*3/uL (ref 4.0–10.5)
nRBC: 0 % (ref 0.0–0.2)

## 2022-06-06 LAB — GLUCOSE, CAPILLARY
Glucose-Capillary: 212 mg/dL — ABNORMAL HIGH (ref 70–99)
Glucose-Capillary: 358 mg/dL — ABNORMAL HIGH (ref 70–99)
Glucose-Capillary: 351 mg/dL — ABNORMAL HIGH (ref 70–99)

## 2022-06-06 LAB — BASIC METABOLIC PANEL
Anion gap: 11 (ref 5–15)
BUN: 22 mg/dL (ref 8–23)
CO2: 24 mmol/L (ref 22–32)
Calcium: 9.2 mg/dL (ref 8.9–10.3)
Chloride: 100 mmol/L (ref 98–111)
Creatinine, Ser: 1.18 mg/dL — ABNORMAL HIGH (ref 0.44–1.00)
GFR, Estimated: 52 mL/min — ABNORMAL LOW (ref >60)
Glucose, Bld: 274 mg/dL — ABNORMAL HIGH (ref 70–99)
Potassium: 4.1 mmol/L (ref 3.5–5.1)
Sodium: 135 mmol/L (ref 135–145)

## 2022-06-06 LAB — MRSA NEXT GEN BY PCR, NASAL: MRSA by PCR Next Gen: NOT DETECTED

## 2022-06-06 MED ORDER — SODIUM CHLORIDE 0.9% FLUSH
3.0000 mL | Freq: Two times a day (BID) | INTRAVENOUS | Status: DC
  Administered 2022-06-06 – 2022-06-09 (×7): 3 mL via INTRAVENOUS

## 2022-06-06 MED ORDER — GUAIFENESIN-DM 100-10 MG/5ML PO SYRP
5.0000 mL | ORAL_SOLUTION | ORAL | Status: DC | PRN
Start: 2022-06-06 — End: 2022-06-09

## 2022-06-06 MED ORDER — LORAZEPAM 1 MG PO TABS
0.5000 mg | ORAL_TABLET | Freq: Two times a day (BID) | ORAL | Status: DC | PRN
  Administered 2022-06-06: 0.5 mg via ORAL
  Filled 2022-06-06: qty 1

## 2022-06-06 MED ORDER — ACETAMINOPHEN 325 MG PO TABS
650.0000 mg | ORAL_TABLET | Freq: Four times a day (QID) | ORAL | Status: DC | PRN
  Administered 2022-06-06 – 2022-06-07 (×2): 650 mg via ORAL
  Filled 2022-06-06 (×2): qty 2

## 2022-06-06 MED ORDER — IPRATROPIUM-ALBUTEROL 0.5-2.5 (3) MG/3ML IN SOLN: 3.0000 mL | RESPIRATORY_TRACT | Status: DC | PRN

## 2022-06-06 MED ORDER — ALBUTEROL SULFATE (2.5 MG/3ML) 0.083% IN NEBU: 3.0000 mL | INHALATION_SOLUTION | RESPIRATORY_TRACT | Status: DC | PRN

## 2022-06-06 MED ORDER — ONDANSETRON HCL 4 MG/2ML IJ SOLN: 4.0000 mg | Freq: Four times a day (QID) | INTRAMUSCULAR | Status: DC | PRN

## 2022-06-06 MED ORDER — IPRATROPIUM-ALBUTEROL 0.5-2.5 (3) MG/3ML IN SOLN
3.0000 mL | Freq: Three times a day (TID) | RESPIRATORY_TRACT | Status: DC
  Administered 2022-06-07: 3 mL via RESPIRATORY_TRACT
  Filled 2022-06-06: qty 3

## 2022-06-06 MED ORDER — GABAPENTIN 100 MG PO CAPS
100.0000 mg | ORAL_CAPSULE | Freq: Two times a day (BID) | ORAL | Status: DC
  Administered 2022-06-06 – 2022-06-09 (×8): 100 mg via ORAL
  Filled 2022-06-06 (×8): qty 1

## 2022-06-06 MED ORDER — INSULIN GLARGINE-YFGN 100 UNIT/ML ~~LOC~~ SOLN
10.0000 [IU] | Freq: Every day | SUBCUTANEOUS | Status: DC
  Administered 2022-06-06 – 2022-06-07 (×2): 10 [IU] via SUBCUTANEOUS
  Filled 2022-06-06 (×2): qty 0.1

## 2022-06-06 MED ORDER — IRBESARTAN 150 MG PO TABS
75.0000 mg | ORAL_TABLET | Freq: Every day | ORAL | Status: DC
  Administered 2022-06-06 – 2022-06-09 (×4): 75 mg via ORAL
  Filled 2022-06-06 (×4): qty 1

## 2022-06-06 MED ORDER — ORAL CARE MOUTH RINSE: 15.0000 mL | OROMUCOSAL | Status: DC | PRN

## 2022-06-06 MED ORDER — HYDROCOD POLI-CHLORPHE POLI ER 10-8 MG/5ML PO SUER
5.0000 mL | Freq: Two times a day (BID) | ORAL | Status: DC
  Administered 2022-06-06 – 2022-06-09 (×7): 5 mL via ORAL
  Filled 2022-06-06 (×7): qty 5

## 2022-06-06 MED ORDER — IPRATROPIUM-ALBUTEROL 0.5-2.5 (3) MG/3ML IN SOLN
3.0000 mL | Freq: Four times a day (QID) | RESPIRATORY_TRACT | Status: DC
  Administered 2022-06-06 (×2): 3 mL via RESPIRATORY_TRACT
  Filled 2022-06-06 (×2): qty 3

## 2022-06-06 MED ORDER — PANTOPRAZOLE SODIUM 40 MG PO TBEC
40.0000 mg | DELAYED_RELEASE_TABLET | Freq: Every day | ORAL | Status: DC
  Administered 2022-06-06 – 2022-06-09 (×4): 40 mg via ORAL
  Filled 2022-06-06 (×4): qty 1

## 2022-06-06 MED ORDER — OXYCODONE HCL 5 MG PO TABS: 5.0000 mg | ORAL_TABLET | Freq: Four times a day (QID) | ORAL | Status: DC | PRN

## 2022-06-06 MED ORDER — ACETAMINOPHEN 650 MG RE SUPP: 650.0000 mg | Freq: Four times a day (QID) | RECTAL | Status: DC | PRN

## 2022-06-06 MED ORDER — IPRATROPIUM-ALBUTEROL 0.5-2.5 (3) MG/3ML IN SOLN
3.0000 mL | RESPIRATORY_TRACT | Status: DC | PRN
Start: 2022-06-06 — End: 2022-06-06

## 2022-06-06 MED ORDER — FUROSEMIDE 20 MG PO TABS
40.0000 mg | ORAL_TABLET | Freq: Two times a day (BID) | ORAL | Status: DC
  Administered 2022-06-06: 40 mg via ORAL
  Filled 2022-06-06: qty 2

## 2022-06-06 MED ORDER — METHYLPREDNISOLONE SODIUM SUCC 125 MG IJ SOLR
80.0000 mg | Freq: Every day | INTRAMUSCULAR | Status: DC
  Administered 2022-06-06 – 2022-06-07 (×2): 80 mg via INTRAVENOUS
  Filled 2022-06-06 (×2): qty 2

## 2022-06-06 MED ORDER — ATORVASTATIN CALCIUM 40 MG PO TABS
40.0000 mg | ORAL_TABLET | Freq: Every day | ORAL | Status: DC
  Administered 2022-06-06 – 2022-06-09 (×4): 40 mg via ORAL
  Filled 2022-06-06 (×4): qty 1

## 2022-06-06 MED ORDER — ASPIRIN 81 MG PO TBEC
81.0000 mg | DELAYED_RELEASE_TABLET | Freq: Every day | ORAL | Status: DC
  Administered 2022-06-06 – 2022-06-09 (×4): 81 mg via ORAL
  Filled 2022-06-06 (×4): qty 1

## 2022-06-06 MED ORDER — VENLAFAXINE HCL ER 75 MG PO CP24
75.0000 mg | ORAL_CAPSULE | Freq: Two times a day (BID) | ORAL | Status: DC
  Administered 2022-06-06 – 2022-06-09 (×7): 75 mg via ORAL
  Filled 2022-06-06 (×8): qty 1

## 2022-06-06 MED ORDER — POLYETHYLENE GLYCOL 3350 17 G PO PACK: 17.0000 g | PACK | Freq: Every day | ORAL | Status: DC | PRN

## 2022-06-06 MED ORDER — GABAPENTIN 300 MG PO CAPS: 300.0000 mg | ORAL_CAPSULE | Freq: Every day | ORAL | Status: DC

## 2022-06-06 MED ORDER — DILTIAZEM HCL ER COATED BEADS 120 MG PO CP24
120.0000 mg | ORAL_CAPSULE | Freq: Every day | ORAL | Status: DC
  Administered 2022-06-06 – 2022-06-09 (×4): 120 mg via ORAL
  Filled 2022-06-06 (×4): qty 1

## 2022-06-06 MED ORDER — HYDROCOD POLI-CHLORPHE POLI ER 10-8 MG/5ML PO SUER: 5.0000 mL | Freq: Two times a day (BID) | ORAL | Status: DC | PRN

## 2022-06-06 MED ORDER — ONDANSETRON HCL 4 MG PO TABS: 4.0000 mg | ORAL_TABLET | Freq: Four times a day (QID) | ORAL | Status: DC | PRN

## 2022-06-06 MED ORDER — FLUTICASONE FUROATE-VILANTEROL 200-25 MCG/ACT IN AEPB
1.0000 | INHALATION_SPRAY | Freq: Every day | RESPIRATORY_TRACT | Status: DC
  Administered 2022-06-06 – 2022-06-09 (×4): 1 via RESPIRATORY_TRACT
  Filled 2022-06-06 (×3): qty 28

## 2022-06-06 MED ORDER — LORAZEPAM 1 MG PO TABS: 1.0000 mg | ORAL_TABLET | Freq: Three times a day (TID) | ORAL | Status: DC | PRN

## 2022-06-06 MED ORDER — GABAPENTIN 100 MG PO CAPS
400.0000 mg | ORAL_CAPSULE | Freq: Every day | ORAL | Status: DC
  Administered 2022-06-06 – 2022-06-08 (×3): 400 mg via ORAL
  Filled 2022-06-06 (×3): qty 4

## 2022-06-06 MED ORDER — BUPROPION HCL ER (XL) 150 MG PO TB24
300.0000 mg | ORAL_TABLET | Freq: Every day | ORAL | Status: DC
  Administered 2022-06-06 – 2022-06-09 (×4): 300 mg via ORAL
  Filled 2022-06-06 (×2): qty 2
  Filled 2022-06-06: qty 1
  Filled 2022-06-06: qty 2

## 2022-06-06 MED ORDER — INSULIN ASPART 100 UNIT/ML IJ SOLN
0.0000 [IU] | INTRAMUSCULAR | Status: DC
  Administered 2022-06-06: 9 [IU] via SUBCUTANEOUS
  Administered 2022-06-06: 3 [IU] via SUBCUTANEOUS
  Administered 2022-06-06: 5 [IU] via SUBCUTANEOUS
  Administered 2022-06-06: 7 [IU] via SUBCUTANEOUS
  Administered 2022-06-06: 9 [IU] via SUBCUTANEOUS
  Administered 2022-06-06: 3 [IU] via SUBCUTANEOUS
  Administered 2022-06-07: 2 [IU] via SUBCUTANEOUS
  Administered 2022-06-07: 9 [IU] via SUBCUTANEOUS
  Administered 2022-06-07: 2 [IU] via SUBCUTANEOUS
  Administered 2022-06-07: 5 [IU] via SUBCUTANEOUS
  Administered 2022-06-07: 2 [IU] via SUBCUTANEOUS
  Administered 2022-06-07 (×2): 7 [IU] via SUBCUTANEOUS
  Administered 2022-06-08 (×2): 3 [IU] via SUBCUTANEOUS
  Administered 2022-06-08: 5 [IU] via SUBCUTANEOUS

## 2022-06-06 MED ORDER — CARVEDILOL 25 MG PO TABS
25.0000 mg | ORAL_TABLET | Freq: Two times a day (BID) | ORAL | Status: DC
  Administered 2022-06-06 – 2022-06-09 (×7): 25 mg via ORAL
  Filled 2022-06-06: qty 2
  Filled 2022-06-06 (×6): qty 1

## 2022-06-06 NOTE — ED Notes (Signed)
RT called

## 2022-06-06 NOTE — Assessment & Plan Note (Addendum)
Systolic blood pressure 053 to 150 mmHg.  Continue with diltiazem, carvedilol and irbersartan.   Continue to hold on diuretic therapy.

## 2022-06-06 NOTE — H&P (Signed)
History and Physical    BRIGITT [name] EVO:[id number] DOB: 10-12-1960 DOA: 06/05/2022  PCP: [name], MD   Patient coming from: home   Chief Complaint: Cough, shortness of breath, wheezing  HPI: [name] is a 61 y.o. female with medical history significant for insulin-dependent diabetes mellitus, hypertension, chronic bronchitis, sarcoidosis, chronic diastolic CHF, and GERD, now presenting with cough, shortness of breath, and wheezing.  Patient reports that she developed a nonproductive cough 3 weeks ago, was seen in an outpatient clinic for this and given a Z-Pak and benzonatate.  She completed the Z-Pak on 05/31/2022 without any improvement and she has not had any improvement in her symptoms with breathing treatments.  Cough has been nonproductive and she denies any associated fever or chills.  She went to [facility] for evaluation of the persistent nonproductive cough and states that she began to develop shortness of breath and wheezing when she was on [facility].  ED Course: Upon arrival to the ED, patient is found to be afebrile and saturating mid to upper 90s on room air with tachypnea and stable blood pressure.  EKG features sinus rhythm and chest x-ray is negative for acute cardiopulmonary disease.  CTA chest is negative for PE but notable for cardiomegaly and enlarged pulmonary artery.  There is narrowing of the laryngeal airway on CT neck, possibly related to phonation.    ENT was consulted by the ED physician, patient was treated with nebs, Decadron, Unasyn, and Ativan, and she was transferred from [place] to [facility] ED for ENT evaluation.  Review of Systems:  All other systems reviewed and apart from HPI, are negative.  Past Medical History:  Diagnosis Date   Abnormal SPEP 04/17/2014   Acute left ankle pain 01/26/2017   ANEMIA-UNSPECIFIED 09/18/2009   Anxiety    Arthritis    Blood transfusion without reported  diagnosis    Cataract    CHF (congestive heart failure) (HCC)    Chronic diastolic heart failure, NYHA class 2 (HCC)    Normal LVEDP by May 2018   COPD (chronic obstructive pulmonary disease) ([place])    Depression    DIABETES MELLITUS, TYPE II 08/21/2006   Diabetic osteomyelitis ([place]) 05/29/2015   Fatty liver    Fracture of 5th metatarsal    non union   GERD 29/93/7169   Had fundoplication   GOUT 67/89/3810   Hammer toe of right foot    2-5th toes   Hx of umbilical hernia repair    HYPERLIPIDEMIA 08/21/2006   HYPERTENSION 08/21/2006   Infection of wound due to methicillin resistant Staphylococcus aureus (MRSA)    Internal hemorrhoids    Kidney problem    Multiple allergies 10/14/2016   OBESITY 06/04/2009   Onychomycosis 10/27/2015   Osteomyelitis of left foot ([place]) 05/29/2015   Osteoporosis    Pulmonary sarcoidosis ([place])    Followed locally by pulmonology, but also by Dr. Casper Harrison at [facility]   Right knee pain 01/26/2017   Vocal cord dysfunction    Wears partial dentures     Past Surgical History:  Procedure Laterality Date   ABDOMINAL HYSTERECTOMY     APPENDECTOMY     BLADDER SUSPENSION  11/11/2011   Procedure: TRANSVAGINAL TAPE (TVT) PROCEDURE;  Surgeon: Olga Millers, MD;  Location: [facility];  Service: Gynecology;  Laterality: N/A;   CAROTIDS  02/18/2011   CAROTID DUPLEX; VERTEBRALS ARE PATENT WITH ANTEGRADE FLOW. ICA/CCA RATIO 1.61 ON RIGHT AND  0.75 ON LEFT   CATARACT EXTRACTION, BILATERAL     CHOLECYSTECTOMY  1984   COLONOSCOPY  04/29/2010   [name]   CYSTOSCOPY  11/11/2011   Procedure: CYSTOSCOPY;  Surgeon: Olga Millers, MD;  Location: [facility];  Service: Gynecology;  Laterality: N/A;   EXTUBATION (ENDOTRACHEAL) IN OR N/A 09/23/2017   Procedure: EXTUBATION (ENDOTRACHEAL) IN OR;  Surgeon: Helayne Seminole, MD;  Location: [place];  Service: ENT;  Laterality: N/A;   FIBEROPTIC LARYNGOSCOPY AND TRACHEOSCOPY N/A 09/23/2017   Procedure: FLEXIBLE FIBEROPTIC  LARYNGOSCOPY;  Surgeon: Helayne Seminole, MD;  Location: [place];  Service: ENT;  Laterality: N/A;   FRACTURE SURGERY     foot   [place] Left 10/02/2018   Procedure: HALLUX ARTHRODESIS;  Surgeon: Edrick Kins, DPM;  Location: [facility];  Service: Podiatry;  Laterality: Left;   HAMMER TOE SURGERY Left 10/02/2018   Procedure: HAMMER TOE CORRECTION 2ND 3RD 4RD FIFTH TOE;  Surgeon: Edrick Kins, DPM;  Location: [facility];  Service: Podiatry;  Laterality: Left;   HAMMER TOE SURGERY Right 04/12/2019   Procedure: HAMMER TOE CORRECTION, 2ND, 3RD, 4TH AND 5TH TOES OF RIGHT FOOT;  Surgeon: Edrick Kins, DPM;  Location: [place];  Service: Podiatry;  Laterality: Right;   HAMMER TOE SURGERY Left 10/25/2019   Procedure: HAMMER TOE REPAIR SECOND, THIRD, FOUTH;  Surgeon: Edrick Kins, DPM;  Location: [place] [facility];  Service: Podiatry;  Laterality: Left;   HERNIA REPAIR     I & D EXTREMITY Left 06/27/2015   Procedure: Partial Excision Left Calcaneus, Place Antibiotic Beads, and Wound VAC;  Surgeon: Newt Minion, MD;  Location: [place];  Service: Orthopedics;  Laterality: Left;   JOINT REPLACEMENT     KNEE ARTHROSCOPY     right   LEFT AND RIGHT HEART CATHETERIZATION WITH CORONARY ANGIOGRAM N/A 04/23/2013   Procedure: LEFT AND RIGHT HEART CATHETERIZATION WITH CORONARY ANGIOGRAM;  Surgeon: Leonie Man, MD;  Location: [facility] C[facility];  Service: Cardiovascular;  Laterality: N/A;   LEFT HEART CATH AND CORONARY ANGIOGRAPHY N/A 03/11/2017   Procedure: Left Heart Cath and Coronary Angiography;  Surgeon: Sherren Mocha, MD;  Location: [place] CV LAB; angiographically minimal CAD in the LAD otherwise normal.;  Normal LVEDP.  FALSE POSITIVE MYOVIEW   LEFT HEART CATH AND CORONARY ANGIOGRAPHY  07/20/2010   LVEF 50-55% WITH VERY MILD GLOBAL HYPOKINESIA; ESSENTIALLY NORMAL CORONARY ARTERIES; NORMAL LV FUNCTION   METATARSAL OSTEOTOMY WITH OPEN REDUCTION INTERNAL FIXATION (ORIF) METATARSAL WITH FUSION Left 04/09/2014    Procedure: LEFT FOOT FRACTURE OPEN TREATMENT METATARSAL INCLUDES INTERNAL FIXATION EACH;  Surgeon: Lorn Junes, MD;  Location: [place];  Service: Orthopedics;  Laterality: Left;   NISSEN FUNDOPLICATION  0865   NM MYOVIEW LTD --> FALSE POSITIVE  03/10/2017   : Moderate size "stress-induced "perfusion defect at the apex as well as "ill-defined stress-induced perfusion defect in the lateral wall.  EF 55%.  INTERMEDIATE risk. -->  FALSE POSITIVE   ORIF DISTAL FEMUR FRACTURE  08/08/2020   [facility]: ORIF of left extra-articular periprosthetic distal femur fracture with synthesis of the femur plate with cortical nonlocking screws.  (Thought to be pathologic fracture due to osteoporosis) -> after a fall   ORIF FEMUR W/ PERI-IMPLANT  08/29/2020   [facility]) revision of left femur fracture ORIF plate fixation screws; culture positive for MSSA   Right and left CARDIAC CATHETERIZATION  04/23/2013   Angiographic normal coronaries;  LVEDP 20 mmHg, PCWP 12-14 mmHg, RAP 12 mmHg.; Fick CO/CI 4.9/2.2   ROTATOR CUFF REPAIR Right 12/09/2021   SHOULDER ARTHROSCOPY Left 03/14/2019   Procedure: LEFT SHOULDER ARTHROSCOPY, DEBRIDEMENT, AND DECOMPRESSION;  Surgeon: Newt Minion, MD;  Location: [place];  Service: Orthopedics;  Laterality: Left;   TOTAL KNEE ARTHROPLASTY Right 06/29/2017   Procedure: RIGHT TOTAL KNEE ARTHROPLASTY;  Surgeon: Newt Minion, MD;  Location: [place];  Service: Orthopedics;  Laterality: Right;   TOTAL KNEE ARTHROPLASTY Left 12/07/2017   TOTAL KNEE ARTHROPLASTY Left 12/07/2017   Procedure: LEFT TOTAL KNEE ARTHROPLASTY;  Surgeon: Newt Minion, MD;  Location: [place];  Service: Orthopedics;  Laterality: Left;   TRACHEOSTOMY TUBE PLACEMENT N/A 09/20/2017   Procedure: AWAKE INTUBATION WITH ANESTHESIA WITH VIDEO ASSISTANCE;  Surgeon: Helayne Seminole, MD;  Location: [place] [facility];  Service: ENT;  Laterality: N/A;   TRANSTHORACIC ECHOCARDIOGRAM  08/2014    Normal LV size and function.  Mild LVH.  EF 55-60%.  Normal regional wall motion.  GR 1 DD.  Normal RV size and function .   TRANSTHORACIC ECHOCARDIOGRAM  08/12/2020    [facility]): Normal LV size and function.  EF 55 to 60%.  No RWM A.  Normal filling pattern.  Normal RV.  No significant valvular disease-stenosis or regurgitation.  No vegetation.   TUBAL LIGATION     with reversal in [place]      Social History:   reports that she has never smoked. She has never used smokeless tobacco. She reports that she does not drink alcohol and does not use drugs.  Allergies  Allergen Reactions   Methotrexate Other (See Comments)    Peri-oral and buccal lesions   Vancomycin Other (See Comments)    DOSE RELATED NEPHROTOXICITY Other reaction(s): other   Lisinopril Cough    Other reaction(s): other   Chlorhexidine Itching   Clindamycin/Lincomycin Nausea And Vomiting and Rash   Lincomycin Nausea Only and Rash    Other reaction(s): vomiting   Teflaro [Ceftaroline] Rash and Other (See Comments)    Tolerates ceftriaxone     Family History  Problem Relation Age of Onset   Diabetes Father    Heart attack Father 7   Coronary artery disease Father    Heart failure Father    Throat cancer Father    Hypertension Father    Heart disease Father    Sleep apnea Father    Obesity Father    COPD Mother    Emphysema Mother    Asthma Mother    Heart failure Mother    Breast cancer Mother    Diabetes Mother    Kidney disease Mother    Thyroid disease Mother    Heart attack Maternal Grandfather    Sarcoidosis Maternal Uncle    Lung cancer Brother    Diabetes Brother    Colon cancer Neg Hx    Colon polyps Neg Hx    Esophageal cancer Neg Hx    Rectal cancer Neg Hx    Stomach cancer Neg Hx      Prior to Admission medications   Medication Sig Start Date End Date Taking? Authorizing Provider  albuterol (PROVENTIL  HFA;VENTOLIN HFA) 108 (90 Base) MCG/ACT inhaler Inhale 1-2 puffs into the lungs every 6 (six) hours as needed for wheezing or shortness of breath. 01/04/19   [name], MD  albuterol (PROVENTIL) (2.5 MG/3ML) 0.083% nebulizer solution  USE 1 VIAL VIA NEBULIZXER EVERY 6 HOURS AS NEEDED FOR WHEEZING OR SHORTNESS OF BREATH    [provider]  allopurinol (ZYLOPRIM) 300 MG tablet TAKE 1 TABLET BY MOUTH EVERY DAY 12/17/21   Aundra Dubin, PA-C  amoxicillin-clavulanate (AUGMENTIN) 875-125 MG tablet Take 1 tablet by mouth 2 (two) times daily. 04/28/22   Tawnya Crook, MD  aspirin EC 81 MG tablet Take 1 tablet (81 mg total) by mouth daily. 11/03/20   Leonie Man, MD  atorvastatin (LIPITOR) 40 MG tablet TAKE 1 TABLET BY MOUTH EVERY DAY 08/31/21   [name], MD  benzonatate (TESSALON PERLES) 100 MG capsule Take 2 capsules (200 mg total) by mouth 3 (three) times daily as needed for cough. 05/25/22   Jeanie Sewer, NP  BREO ELLIPTA 200-25 MCG/INH AEPB TAKE 1 PUFF BY MOUTH EVERY DAY 06/04/19   [name], MD  buPROPion (WELLBUTRIN XL) 300 MG 24 hr tablet TAKE 1 TABLET BY MOUTH EVERY DAY 01/04/22   [name], MD  carvedilol (COREG) 25 MG tablet Take 1 tablet by mouth 2 (two) times daily with a meal.    [provider]  Cholecalciferol 25 MCG (1000 UT) capsule Take 1,000 Units by mouth in the morning and at bedtime. 10/26/11   [provider]  Continuous Blood Gluc Sensor (FREESTYLE LIBRE 3 SENSOR) MISC 1 Device by Does not apply route every 14 (fourteen) days. Apply 1 sensor on upper arm every 14 days for continuous glucose monitoring 05/03/22   Elayne Snare, MD  diclofenac Sodium (VOLTAREN) 1 % GEL Apply 1 application topically as needed (pain.).     [provider]  diltiazem (CARDIZEM CD) 120 MG 24 hr capsule TAKE 1 CAPSULE BY MOUTH EVERY DAY 01/28/22   Leonie Man, MD  Dulaglutide (TRULICITY) 4.5 ZO/1.0RU SOPN Inject 4.5 mg as directed  once a week. 02/02/22   Renato Shin, MD  famotidine (PEPCID) 20 MG tablet Take 1 tablet (20 mg total) by mouth at bedtime. 12/13/18   Willia Craze, NP  fenofibrate 160 MG tablet TAKE 1 TABLET BY MOUTH EVERY DAY 01/04/22   Leonie Man, MD  fluticasone [facility]) 50 MCG/ACT nasal spray Place 2 sprays into both nostrils daily as needed for allergies. 01/22/20   [name], MD  furosemide (LASIX) 40 MG tablet TAKE 1 TABLET BY MOUTH TWICE A DAY 12/17/21   [name], MD  gabapentin (NEURONTIN) 100 MG capsule TAKE 1 CAPSULE BY MOUTH THREE TIMES A DAY 02/02/22   [name], MD  gabapentin (NEURONTIN) 300 MG capsule Take at night in addition to the '100mg'$  gabapentin to get '400mg'$  at night and '100mg'$  in the morning and at mid day. 03/29/22   Gregor Hams, MD  glucose blood (CONTOUR NEXT TEST) test strip 1 each by Other route 4 (four) times daily. And lancets 4/day 09/19/19   Renato Shin, MD  HYDROcodone-acetaminophen (NORCO/VICODIN) 5-325 MG tablet Take 1 tablet by mouth every 6 (six) hours as needed. 03/29/22   Gregor Hams, MD  insulin aspart (NOVOLOG FLEXPEN) 100 UNIT/ML FlexPen Inject 15 Units into the skin daily with supper. 11/06/21   Renato Shin, MD  insulin glargine (LANTUS SOLOSTAR) 100 UNIT/ML Solostar Pen Inject 16 Units into the skin every morning. 07/30/21   Renato Shin, MD  Insulin Pen Needle (PEN NEEDLES) 32G X 4 MM MISC 1 each by Does not apply route 4 (four) times daily. E11.9 02/22/20   Loanne Drilling,  Hilliard Clark, MD  ipratropium (ATROVENT) 0.03 % nasal spray PLACE 2 SPRAYS INTO BOTH NOSTRILS EVERY 12 (TWELVE) HOURS. 03/11/21   [name], MD  KLOR-CON M20 20 MEQ tablet TAKE 1 & 1/2 TABLETS BY MOUTH TWICE A DAY 01/27/22   [name], MD  lactulose (CHRONULAC) 10 GM/15ML solution TAKE 15 MLS BY MOUTH DAILY AS NEEDED FOR CONSTIPATION. 10/07/20   [name], MD  Lancet Devices (EASY MINI EJECT LANCING DEVICE) MISC Use as directed 4 times daily to test blood sugar  11/04/20   Renato Shin, MD  LINZESS 290 MCG CAPS capsule TAKE 1 CAPSULE BY MOUTH DAILY BEFORE BREAKFAST. 09/07/21   Irene Shipper, MD  LORazepam (ATIVAN) 0.5 MG tablet Take 0.5 mg by mouth every 12 (twelve) hours as needed for anxiety.    [provider]  meloxicam (MOBIC) 15 MG tablet Take 1 tablet (15 mg total) by mouth daily. 06/24/21   Marybelle Killings, MD  metFORMIN (GLUCOPHAGE-XR) 500 MG 24 hr tablet Take 4 tablets (2,000 mg total) by mouth daily with breakfast. 05/05/22   Elayne Snare, MD  metoCLOPramide (REGLAN) 10 MG tablet Take 1 tablet (10 mg total) by mouth every 6 (six) hours. 02/12/64   Delora Fuel, MD  Microlet Lancets MISC 1 each by Does not apply route 2 (two) times daily. E11.9 09/19/19   Renato Shin, MD  nitroGLYCERIN (NITROSTAT) 0.4 MG SL tablet Place 1 tablet (0.4 mg total) under the tongue every 5 (five) minutes as needed for chest pain. Reported on 01/05/2016 01/10/18   Leonie Man, MD  nystatin (MYCOSTATIN) 100000 UNIT/ML suspension nystatin 100,000 unit/mL oral suspension  SWISH, GARGLE, AND SPIT 20 ML 4 TIMES A DAY UNTIL RESOLVED    [provider]  ondansetron (ZOFRAN-ODT) 4 MG disintegrating tablet Take 1 tablet (4 mg total) by mouth every 8 (eight) hours as needed for nausea or vomiting. 11/17/21   Renato Shin, MD  oxyCODONE-acetaminophen (PERCOCET/ROXICET) 5-325 MG tablet Take 1 tablet by mouth every 4 (four) hours as needed. 12/10/21   [provider]  pantoprazole (PROTONIX) 40 MG tablet Take 1 tablet (40 mg total) by mouth daily. 04/29/22   [name], MD  phenazopyridine (PYRIDIUM) 100 MG tablet Take 1 tablet (100 mg total) by mouth 3 (three) times daily as needed for pain. 01/13/22   Tawnya Crook, MD  telmisartan (MICARDIS) 20 MG tablet TAKE 1 TABLET BY MOUTH EVERY DAY 01/04/22   [name], MD  traMADol (ULTRAM) 50 MG tablet TAKE 1 TABLET BY MOUTH EVERY 12 HOURS AS NEEDED FOR MODERATE TO SEVERE PAIN    [provider]   triamcinolone cream (KENALOG) 0.1 % APPLY TOPICALLY TO RASH 2 TIMES DAILY AS NEEDED. STOP USE AFTER 7-10 DAYS    [provider]  venlafaxine XR (EFFEXOR-XR) 75 MG 24 hr capsule TAKE 1 CAPSULE BY MOUTH TWICE A DAY 01/28/22   [name], MD  mupirocin nasal ointment (BACTROBAN) 2 % Place 1 application into the nose 2 (two) times daily. Use one-half of tube in each nostril twice daily for five (5) days. After application, press sides of nose together and gently massage. 03/28/18 03/28/18  [name], MD    Physical Exam: Vitals:   06/06/22 0128 06/06/22 0215 06/06/22 0245 06/06/22 0345  BP: (!) 159/79 (!) 168/77 (!) 162/78 (!) 153/72  Pulse: 87 91 94 97  Resp: (!) 27 (!) 22 (!) 31 (!) 28  Temp:  TempSrc:      SpO2: 100% 97% 96% 98%  Weight:      Height:        Constitutional: No pallor, no diaphoresis   Eyes: PERTLA, lids and conjunctivae normal ENMT: Mucous membranes are moist. Posterior pharynx clear of any exudate or lesions.   Neck: supple, no masses  Respiratory: Prolonged expiratory phase, upper airways wheezing on expiration. Speaking full sentences.   Cardiovascular: S1 & S2 heard, regular rate and rhythm. No extremity edema.  Abdomen: No distension, no tenderness, soft. Bowel sounds active.  Musculoskeletal: no clubbing / cyanosis. No joint deformity upper and lower extremities.   Skin: no significant rashes, lesions, ulcers. Warm, dry, well-perfused. Neurologic: CN 2-12 grossly intact. Moving all extremities. Alert and oriented.  Psychiatric: Calm. Cooperative.    Labs and Imaging on Admission: I have personally reviewed following labs and imaging studies  CBC: Recent Labs  Lab 06/05/22 1842  WBC 13.5*  HGB 10.7*  HCT 32.6*  MCV 92.4  PLT 573   Basic Metabolic Panel: Recent Labs  Lab 06/05/22 1842  NA 137  K 4.3  CL 101  CO2 24  GLUCOSE 191*  BUN 22  CREATININE 1.21*  CALCIUM 9.6   GFR: Estimated Creatinine Clearance: 57.8  mL/min (A) (by C-G formula based on SCr of 1.21 mg/dL (H)). Liver Function Tests: No results for input(s): "AST", "ALT", "ALKPHOS", "BILITOT", "PROT", "ALBUMIN" in the last 168 hours. No results for input(s): "LIPASE", "AMYLASE" in the last 168 hours. No results for input(s): "AMMONIA" in the last 168 hours. Coagulation Profile: No results for input(s): "INR", "PROTIME" in the last 168 hours. Cardiac Enzymes: No results for input(s): "CKTOTAL", "CKMB", "CKMBINDEX", "TROPONINI" in the last 168 hours. BNP (last 3 results) No results for input(s): "PROBNP" in the last 8760 hours. HbA1C: No results for input(s): "HGBA1C" in the last 72 hours. CBG: No results for input(s): "GLUCAP" in the last 168 hours. Lipid Profile: No results for input(s): "CHOL", "HDL", "LDLCALC", "TRIG", "CHOLHDL", "LDLDIRECT" in the last 72 hours. Thyroid Function Tests: No results for input(s): "TSH", "T4TOTAL", "FREET4", "T3FREE", "THYROIDAB" in the last 72 hours. Anemia Panel: No results for input(s): "VITAMINB12", "FOLATE", "FERRITIN", "TIBC", "IRON", "RETICCTPCT" in the last 72 hours. Urine analysis:    Component Value Date/Time   COLORURINE YELLOW 11/17/2021 2031   APPEARANCEUR CLEAR 11/17/2021 2031   LABSPEC 1.016 11/17/2021 2031   PHURINE 5.0 11/17/2021 2031   GLUCOSEU NEGATIVE 11/17/2021 2031   HGBUR NEGATIVE 11/17/2021 2031   HGBUR negative 10/20/2010 0814   BILIRUBINUR negative 04/28/2022 1308   KETONESUR NEGATIVE 11/17/2021 2031   PROTEINUR Negative 04/28/2022 1308   PROTEINUR NEGATIVE 11/17/2021 2031   UROBILINOGEN 0.2 04/28/2022 1308   UROBILINOGEN 0.2 04/27/2015 1226   NITRITE negative 04/28/2022 1308   NITRITE NEGATIVE 11/17/2021 2031   LEUKOCYTESUR Negative 04/28/2022 1308   LEUKOCYTESUR SMALL (A) 11/17/2021 2031   Sepsis Labs: '@LABRCNTIP'$ (procalcitonin:4,lacticidven:4) ) Recent Results (from the past 240 hour(s))  Resp Panel by RT-PCR (Flu A&B, Covid) Anterior Nasal Swab     Status:  None   Collection Time: 06/06/22  2:51 AM   Specimen: Anterior Nasal Swab  Result Value Ref Range Status   SARS Coronavirus 2 by RT PCR NEGATIVE NEGATIVE Final    Comment: (NOTE) SARS-CoV-2 target nucleic acids are NOT DETECTED.  The SARS-CoV-2 RNA is generally detectable in upper respiratory specimens during the acute phase of infection. The lowest concentration of SARS-CoV-2 viral copies this assay can detect is 138 copies/mL. A  negative result does not preclude SARS-Cov-2 infection and should not be used as the sole basis for treatment or other patient management decisions. A negative result may occur with  improper specimen collection/handling, submission of specimen other than nasopharyngeal swab, presence of viral mutation(s) within the areas targeted by this assay, and inadequate number of viral copies(<138 copies/mL). A negative result must be combined with clinical observations, patient history, and epidemiological information. The expected result is Negative.  Fact Sheet for Patients:  EntrepreneurPulse.com.au  Fact Sheet for Healthcare Providers:  IncredibleEmployment.be  This test is no t yet approved or cleared by the [place] FDA and  has been authorized for detection and/or diagnosis of SARS-CoV-2 by FDA under an Emergency Use Authorization (EUA). This EUA will remain  in effect (meaning this test can be used) for the duration of the COVID-19 declaration under Section 564(b)(1) of the Act, 21 U.S.C.section 360bbb-3(b)(1), unless the authorization is terminated  or revoked sooner.       Influenza A by PCR NEGATIVE NEGATIVE Final   Influenza B by PCR NEGATIVE NEGATIVE Final    Comment: (NOTE) The Xpert Xpress SARS-CoV-2/FLU/RSV plus assay is intended as an aid in the diagnosis of influenza from Nasopharyngeal swab specimens and should not be used as a sole basis for treatment. Nasal washings and aspirates are unacceptable  for Xpert Xpress SARS-CoV-2/FLU/RSV testing.  Fact Sheet for Patients: EntrepreneurPulse.com.au  Fact Sheet for Healthcare Providers: IncredibleEmployment.be  This test is not yet approved or cleared by the [place] FDA and has been authorized for detection and/or diagnosis of SARS-CoV-2 by FDA under an Emergency Use Authorization (EUA). This EUA will remain in effect (meaning this test can be used) for the duration of the COVID-19 declaration under Section 564(b)(1) of the Act, 21 U.S.C. section 360bbb-3(b)(1), unless the authorization is terminated or revoked.  Performed at [facility], [place] [address] [place], [place] [address]      Radiological Exams on Admission: CT Soft Tissue Neck W Contrast  Result Date: 06/05/2022 CLINICAL DATA:  Cough with abnormal breath sounds EXAM: CT NECK WITH CONTRAST TECHNIQUE: Multidetector CT imaging of the neck was performed using the standard protocol following the bolus administration of intravenous contrast. RADIATION DOSE REDUCTION: This exam was performed according to the departmental dose-optimization program which includes automated exposure control, adjustment of the mA and/or kV according to patient size and/or use of iterative reconstruction technique. CONTRAST:  149m OMNIPAQUE IOHEXOL 350 MG/ML SOLN COMPARISON:  09/20/2017 FINDINGS: PHARYNX AND LARYNX: The pharynx and epiglottis are normal. Narrowing of the laryngeal airway is favored to be due to phonation. The vocal cords do not appear edematous. SALIVARY GLANDS: Normal parotid, submandibular and sublingual glands. THYROID: Normal. LYMPH NODES: No enlarged or abnormal density lymph nodes. VASCULAR: Major cervical vessels are patent. LIMITED INTRACRANIAL: Normal. VISUALIZED ORBITS: Normal. MASTOIDS AND VISUALIZED PARANASAL SINUSES: No fluid levels or advanced mucosal thickening. No mastoid effusion. SKELETON: No bony spinal canal stenosis. No  lytic or blastic lesions. UPPER CHEST: Clear. OTHER: None. IMPRESSION: 1. No acute abnormality of the neck. 2. Narrowing of the laryngeal airway is favored to be incidental, possibly phonation. If there is significant respiratory stridor, consider direct laryngoscopic assessment. Electronically Signed   By: KUlyses JarredM.D.   On: 06/05/2022 22:13   CT Angio Chest PE W and/or Wo Contrast  Result Date: 06/05/2022 CLINICAL DATA:  Cough. EXAM: CT ANGIOGRAPHY CHEST WITH CONTRAST TECHNIQUE: Multidetector CT imaging of the chest was performed using the standard protocol  during bolus administration of intravenous contrast. Multiplanar CT image reconstructions and MIPs were obtained to evaluate the vascular anatomy. RADIATION DOSE REDUCTION: This exam was performed according to the departmental dose-optimization program which includes automated exposure control, adjustment of the mA and/or kV according to patient size and/or use of iterative reconstruction technique. CONTRAST:  134m OMNIPAQUE IOHEXOL 350 MG/ML SOLN COMPARISON:  CT chest 01/24/2014 FINDINGS: Cardiovascular: Heart is enlarged. Aorta is normal in size. There is no pericardial effusion. There are atherosclerotic calcifications of the aorta and coronary arteries. There is adequate opacification of the pulmonary arteries to the segmental level. There is no pulmonary embolism identified. Main pulmonary artery is enlarged. Mediastinum/Nodes: No enlarged mediastinal, hilar, or axillary lymph nodes. Thyroid gland, trachea, and esophagus demonstrate no significant findings. Lungs/Pleura: Lungs are clear. No pleural effusion or pneumothorax. Upper Abdomen: There surgical clips in the proximal stomach. Musculoskeletal: There are healed right-sided rib fractures. No acute fractures are seen. Review of the MIP images confirms the above findings. IMPRESSION: 1. No evidence for pulmonary embolism. 2. Cardiomegaly. Enlarged main pulmonary artery can be seen with  pulmonary artery hypertension. Electronically Signed   By: ARonney AstersM.D.   On: 06/05/2022 21:59   DG Chest Port 1 View  Result Date: 06/05/2022 CLINICAL DATA:  Nonproductive cough EXAM: PORTABLE CHEST 1 VIEW COMPARISON:  12/03/2021 FINDINGS: Heart and mediastinal contours are within normal limits. No focal opacities or effusions. No acute bony abnormality. IMPRESSION: No active disease. Electronically Signed   By: KRolm BaptiseM.D.   On: 06/05/2022 20:13    EKG: Independently reviewed. SR.   Assessment/Plan   1. SOB  - Sent from [facility] d/t concern for stridor  - Appreciate PCCM assessment and recommendations  - No stridor on exam; no evidence for epiglottitis on CT  - ?vocal cord dysfunction exacerbated by cough  - Check flu & COVID pcr, continue PPI, continue prn Ativan, start cough suppressant, use Duonebs prn, trial cool mist    2. Chronic bronchitis  - No sputum production or wheezing to suggest exacerbation  - Continue ICS/LABA and as needed SABA    3. Insulin-dependent DM  - A1c was 8.4% in July 2023 - Continue CBG checks and insulin    4. Hypertension  - Continue Coreg, diltiazem, ARB    5. Chronic HFpEF  - Appears compensated  - EF was preserved on TTE from 2015  - Continue Lasix, ARB, beta-blocker    6. CKD IIIa  - SCr is 1.21 on admission; baseline appears to be ~1.1  - Renally-dose medications   7. Sarcoidosis  - Pt reports doing well off of steroids for the past couple years    8. Depression, anxiety  - Continue Effexor, Wellbutrin, as-needed Ativan    DVT prophylaxis: SCDs  Code Status: Full  Level of Care: Level of care: Progressive Family Communication: Husband at bedside  Disposition Plan:  Patient is from: home  Anticipated d/c is to: Home  Anticipated d/c date is: Possibly as early as 06/07/22  Patient currently: Pending ENT consultation Consults called: ENT, PCCM Admission status: Observation     TVianne Bulls MD [facility]  06/06/2022, 4:03 AM

## 2022-06-06 NOTE — Assessment & Plan Note (Addendum)
Her symptoms are slowly improving but not back to baseline yet. She has tested negative for COVID 19 for the second time, I suspect she did have COVID a few weeks ago. Her husband has tested positive on 08/28 for COVID 19.   Plan to continue with bronchodilator therapy Systemic and inhaled corticosteroids Change from methylprednisolone to prednisone.  Continue with antitussive agents and airway clearing techniques. Out of bed to chair tid with meals, pt and ot.

## 2022-06-06 NOTE — Assessment & Plan Note (Signed)
Plan to continue with venlafaxine and bupropion Continue with as needed lorazepam.

## 2022-06-06 NOTE — Hospital Course (Addendum)
[name] was admitted to the hospital with the working diagnosis of dyspnea.   61 yo female with the past medical history of hypertension, chronic bronchitis, sarcoidosis, diastolic heart failure, GERD and T2DM who presented with dyspnea. Reported 3 weeks of non productive cough, that was treated with azithromycin with no improvement in her symptoms. Over last few days her dyspnea was associated with dyspnea and wheezing prompting her to come to the ED. On her initial physical examination her blood pressure was 159/79, HR 87, RR 27 and 02 saturation 97%, lungs with prolonged expiratory phase, upper airway wheezing (expiratory), heart with S1 and S2 present and rhythmic with no gallops, abdomen with no distention, no lower extremity edema.   NA 137, K 4,3 Cl 101, bicarbonate 24, glucose 191, bun 22 cr 1,21 High sensitive troponin 5 and 5  Wbc 13,5 hgb 10,7 plt 372  Sars covid 19 negative   Chest radiograph with no infiltrates. CT chest with no pulmonary embolism, with no infiltrates.  CT neck soft tissue no acute changes, narrowing of the laryngeal airway is favored to be incidental possible phonation.   EKG 93 bpm, normal axis, normal intervals, sinus rhythm, small q led II, III, AvF, lead V5 and V6 with no significant ST segment or T wave changes.   Patient was evaluated by ENT, fiberoptic examination was normal. Normal vocal cord motion and open airway.   Patient has been placed on bronchodilator therapy and systemic steroids with slow improvement in her symptoms.  Her husband has tested positive for COVID 19.

## 2022-06-06 NOTE — Assessment & Plan Note (Signed)
Renal function with serum cr at 1,40 with K at 4,2 and serum bicarbonate at 25. Plan to continue close monitoring of renal function and continue supportive medical therapy. Avoid hypotension and nephrotoxic medications.

## 2022-06-06 NOTE — Plan of Care (Signed)
Discussed with patient plan of care for the evening, pain management and blood sugars while on steroids with some teach back displayed.  Problem: Education: Goal: Knowledge of General Education information will improve Description: Including pain rating scale, medication(s)/side effects and non-pharmacologic comfort measures Outcome: Progressing

## 2022-06-06 NOTE — Consult Note (Signed)
NAME:  [name], MRN:  [id number], DOB:  11-02-1960, LOS: 0 ADMISSION DATE:  06/05/2022, CONSULTATION DATE:  06/06/22 REFERRING MD:  Betsey Holiday, CHIEF COMPLAINT:  sob   History of Present Illness:  61 yo woman with hx of CHF, COPD< DM, HLD, HTN, Sarcoidoisis with pulmonary sarcoidosis (per hx on chart), hx of vocal cord Spasm, here with cough x 3 weeks, Shortness of breath, wheezing.  +mild CP. Still having difficulty with tachypnea, increased work of breathing, respiratory noises on exhalation.   Per husband similar presentation in past, has resolved with ativan in past, though has not improved today with ativan per patient.  He says she does improve when sleeping or when more calm and not talking.    Usually they try to get her cough under control when it flares up because excessive cough seems to lead to vocal cord spasm symptoms.   Recently completed course of azithromycin.  Hx of vocal cord dysfunction Also has hx of epiglotitis in 2020 which required intubation per husband.   Takes lasix '40mg'$  daily.   IN ED: albuterol, decadron '10mg'$  IV, duoneb, ativan Ampicillin.  Pertinent  Medical History  Anxiety/dep CHF, diastolic COPD  Dm II GERD GOUT HTN HLD Sarcoidosis, pulmonary VCD  Med: albuterol, allopurinol, augmentin (04/28/22?), asa, tessalon, breo/ellipta, wellbutrin, coreg, vit D , dilt, trulicity, fenofibrate, fluticasone, lasix, gabapentin, norco prn, atrovent, lactulose. Ativan, meloxicam, metrormin, reglan, ntg, nystatin swish and gargle, spit, percocet, protonix, micardis, pyridium, effexor,   Significant Hospital Events: Including procedures, antibiotic start and stop dates in addition to other pertinent events     Interim History / Subjective:   Objective   Blood pressure (!) 159/79, pulse 87, temperature 97.8 F (36.6 C), temperature source Oral, resp. rate (!) 27, height '5\' 6"'$  (1.676 m), weight 100.8 kg, SpO2 100 %.        Intake/Output Summary (Last 24  hours) at 06/06/2022 0131 Last data filed at 06/06/2022 0052 Gross per 24 hour  Intake 100.16 ml  Output --  Net 100.16 ml   Filed Weights   06/05/22 1832  Weight: 100.8 kg    Examination: General: alert and oriented, looks uncomfortable.  Tachypneic.  Saturation 98-100% on room air. Mucous membranes dry  HENT: ncat Lungs: no wheeze, CTAB  Cardiovascular: RRR no mgr  Abdomen: nt, nd, nbs  Extremities: no edema  Neuro: alert and oriented    Labs  AKI Cr 1.21 (BL 1.09)  WBC 13.5   CT chest IMPRESSION: 1. No evidence for pulmonary embolism. 2. Cardiomegaly. Enlarged main pulmonary artery can be seen with pulmonary artery hypertension.  CT neck    IMPRESSION: 1. No acute abnormality of the neck. 2. Narrowing of the laryngeal airway is favored to be incidental, possibly phonation. If there is significant respiratory stridor, consider direct laryngoscopic assessment.  Cxr : negative   Assessment & Plan:  [name] is a 61 yo woman with a hx of diagnosis of pulmonary sarcoidosis, hx of vocal cord dysfunction, here with cough and dyspnea.    SOB/Dyspnea: no stridor actually on my exam. Expiratory upper respiratory sounds.   No signs of epiglottitis on ct.  Could be c/w vocal cord dysfunction exacerbated by URI/cough.   Check flu/covid.  No evidence of PNA on CT chest.   Trial of cool mist, cont ativan prn.   Cont ppi for GERD.  Cont tessalon perles for cough.  Consider cough suppressant to decrease irritation to vocal cords.   Duonebs for COPD, could be  helpful if there is a copd component to cough, however I do not hear any wheezing on exam currently.  I do not think she requires monitoring in the ICU at the moment.   Pulmonary can continue to follow her for vocal cord dysfunction.    Labs   CBC: Recent Labs  Lab 06/05/22 1842  WBC 13.5*  HGB 10.7*  HCT 32.6*  MCV 92.4  PLT 485    Basic Metabolic Panel: Recent Labs  Lab 06/05/22 1842  NA 137  K 4.3   CL 101  CO2 24  GLUCOSE 191*  BUN 22  CREATININE 1.21*  CALCIUM 9.6   GFR: Estimated Creatinine Clearance: 57.8 mL/min (A) (by C-G formula based on SCr of 1.21 mg/dL (H)). Recent Labs  Lab 06/05/22 1842  WBC 13.5*    Liver Function Tests: No results for input(s): "AST", "ALT", "ALKPHOS", "BILITOT", "PROT", "ALBUMIN" in the last 168 hours. No results for input(s): "LIPASE", "AMYLASE" in the last 168 hours. No results for input(s): "AMMONIA" in the last 168 hours.  ABG    Component Value Date/Time   PHART 7.469 (H) 09/23/2017 0325   PCO2ART 35.4 09/23/2017 0325   PO2ART 198 (H) 09/23/2017 0325   HCO3 25.4 09/23/2017 0325   TCO2 25 09/20/2017 1431   ACIDBASEDEF 1.8 09/20/2017 2042   O2SAT 99.6 09/23/2017 0325     Coagulation Profile: No results for input(s): "INR", "PROTIME" in the last 168 hours.  Cardiac Enzymes: No results for input(s): "CKTOTAL", "CKMB", "CKMBINDEX", "TROPONINI" in the last 168 hours.  HbA1C: Hemoglobin A1C  Date/Time Value Ref Range Status  02/02/2022 01:32 PM 8.8 (A) 4.0 - 5.6 % Final  12/03/2021 08:10 AM 8.7 (A) 4.0 - 5.6 % Final  08/08/2020 12:00 AM 7.9  Final   Hgb A1c MFr Bld  Date/Time Value Ref Range Status  04/29/2022 10:14 AM 8.4 (H) 4.6 - 6.5 % Final    Comment:    Glycemic Control Guidelines for People with Diabetes:Non Diabetic:  <6%Goal of Therapy: <7%Additional Action Suggested:  >8%   10/18/2019 09:19 AM 9.2 (H) 4.8 - 5.6 % Final    Comment:    (NOTE) Pre diabetes:          5.7%-6.4% Diabetes:              >6.4% Glycemic control for   <7.0% adults with diabetes     CBG: No results for input(s): "GLUCAP" in the last 168 hours.  Review of Systems:   Review of Systems  Constitutional:  Negative for fever.  HENT:  Negative for hearing loss.   Eyes:  Negative for blurred vision.  Respiratory:  Positive for cough, shortness of breath and wheezing. Negative for hemoptysis and sputum production.   Cardiovascular:   Negative for chest pain and palpitations.  Gastrointestinal:  Negative for heartburn.  Genitourinary:  Negative for dysuria.  Musculoskeletal:  Negative for myalgias.  Skin:  Negative for rash.  Neurological:  Negative for dizziness.  Endo/Heme/Allergies:  Does not bruise/bleed easily.  Psychiatric/Behavioral:  Negative for depression.      Past Medical History:  She,  has a past medical history of Abnormal SPEP (04/17/2014), Acute left ankle pain (01/26/2017), ANEMIA-UNSPECIFIED (09/18/2009), Anxiety, Arthritis, Blood transfusion without reported diagnosis, Cataract, CHF (congestive heart failure) ([place]), Chronic diastolic heart failure, NYHA class 2 ([place]), COPD (chronic obstructive pulmonary disease) ([place]), Depression, DIABETES MELLITUS, TYPE II (08/21/2006), Diabetic osteomyelitis ([place]) (05/29/2015), Fatty liver, Fracture of 5th metatarsal, GERD (08/21/2006), GOUT (08/20/2010), Hammer toe of  right foot, umbilical hernia repair, HYPERLIPIDEMIA (08/21/2006), HYPERTENSION (08/21/2006), Infection of wound due to methicillin resistant Staphylococcus aureus (MRSA), Internal hemorrhoids, Kidney problem, Multiple allergies (10/14/2016), OBESITY (06/04/2009), Onychomycosis (10/27/2015), Osteomyelitis of left foot ([place]) (05/29/2015), Osteoporosis, Pulmonary sarcoidosis ([place]), Right knee pain (01/26/2017), Vocal cord dysfunction, and Wears partial dentures.   Surgical History:   Past Surgical History:  Procedure Laterality Date   ABDOMINAL HYSTERECTOMY     APPENDECTOMY     BLADDER SUSPENSION  11/11/2011   Procedure: TRANSVAGINAL TAPE (TVT) PROCEDURE;  Surgeon: Olga Millers, MD;  Location: [facility];  Service: Gynecology;  Laterality: N/A;   CAROTIDS  02/18/2011   CAROTID DUPLEX; VERTEBRALS ARE PATENT WITH ANTEGRADE FLOW. ICA/CCA RATIO 1.61 ON RIGHT AND 0.75 ON LEFT   CATARACT EXTRACTION, BILATERAL     CHOLECYSTECTOMY  1984   COLONOSCOPY  04/29/2010   [name]   CYSTOSCOPY  11/11/2011   Procedure: CYSTOSCOPY;   Surgeon: Olga Millers, MD;  Location: [facility];  Service: Gynecology;  Laterality: N/A;   EXTUBATION (ENDOTRACHEAL) IN OR N/A 09/23/2017   Procedure: EXTUBATION (ENDOTRACHEAL) IN OR;  Surgeon: Helayne Seminole, MD;  Location: [facility];  Service: ENT;  Laterality: N/A;   FIBEROPTIC LARYNGOSCOPY AND TRACHEOSCOPY N/A 09/23/2017   Procedure: FLEXIBLE FIBEROPTIC LARYNGOSCOPY;  Surgeon: Helayne Seminole, MD;  Location: [facility];  Service: ENT;  Laterality: N/A;   FRACTURE SURGERY     foot   [place] Left 10/02/2018   Procedure: HALLUX ARTHRODESIS;  Surgeon: Edrick Kins, DPM;  Location: [facility];  Service: Podiatry;  Laterality: Left;   HAMMER TOE SURGERY Left 10/02/2018   Procedure: HAMMER TOE CORRECTION 2ND 3RD 4RD FIFTH TOE;  Surgeon: Edrick Kins, DPM;  Location: [facility];  Service: Podiatry;  Laterality: Left;   HAMMER TOE SURGERY Right 04/12/2019   Procedure: HAMMER TOE CORRECTION, 2ND, 3RD, 4TH AND 5TH TOES OF RIGHT FOOT;  Surgeon: Edrick Kins, DPM;  Location: [facility];  Service: Podiatry;  Laterality: Right;   HAMMER TOE SURGERY Left 10/25/2019   Procedure: HAMMER TOE REPAIR SECOND, THIRD, FOUTH;  Surgeon: Edrick Kins, DPM;  Location: [facility];  Service: Podiatry;  Laterality: Left;   HERNIA REPAIR     I & D EXTREMITY Left 06/27/2015   Procedure: Partial Excision Left Calcaneus, Place Antibiotic Beads, and Wound VAC;  Surgeon: Newt Minion, MD;  Location: [facility];  Service: Orthopedics;  Laterality: Left;   JOINT REPLACEMENT     KNEE ARTHROSCOPY     right   LEFT AND RIGHT HEART CATHETERIZATION WITH CORONARY ANGIOGRAM N/A 04/23/2013   Procedure: LEFT AND RIGHT HEART CATHETERIZATION WITH CORONARY ANGIOGRAM;  Surgeon: Leonie Man, MD;  Location: [facility] C[facility] LAB;  Service: Cardiovascular;  Laterality: N/A;   LEFT HEART CATH AND CORONARY ANGIOGRAPHY N/A 03/11/2017   Procedure: Left Heart Cath and Coronary Angiography;  Surgeon: Sherren Mocha, MD;  Location: [facility] CV LAB;  angiographically minimal CAD in the LAD otherwise normal.;  Normal LVEDP.  FALSE POSITIVE MYOVIEW   LEFT HEART CATH AND CORONARY ANGIOGRAPHY  07/20/2010   LVEF 50-55% WITH VERY MILD GLOBAL HYPOKINESIA; ESSENTIALLY NORMAL CORONARY ARTERIES; NORMAL LV FUNCTION   METATARSAL OSTEOTOMY WITH OPEN REDUCTION INTERNAL FIXATION (ORIF) METATARSAL WITH FUSION Left 04/09/2014   Procedure: LEFT FOOT FRACTURE OPEN TREATMENT METATARSAL INCLUDES INTERNAL FIXATION EACH;  Surgeon: Lorn Junes, MD;  Location: [facility];  Service: Orthopedics;  Laterality: Left;   NISSEN FUNDOPLICATION  6010   NM  MYOVIEW LTD --> FALSE POSITIVE  03/10/2017   : Moderate size "stress-induced "perfusion defect at the apex as well as "ill-defined stress-induced perfusion defect in the lateral wall.  EF 55%.  INTERMEDIATE risk. -->  FALSE POSITIVE   ORIF DISTAL FEMUR FRACTURE  08/08/2020   [facility]: ORIF of left extra-articular periprosthetic distal femur fracture with synthesis of the femur plate with cortical nonlocking screws.  (Thought to be pathologic fracture due to osteoporosis) -> after a fall   ORIF FEMUR W/ PERI-IMPLANT  08/29/2020   [facility]) revision of left femur fracture ORIF plate fixation screws; culture positive for MSSA   Right and left CARDIAC CATHETERIZATION  04/23/2013   Angiographic normal coronaries; LVEDP 20 mmHg, PCWP 12-14 mmHg, RAP 12 mmHg.; Fick CO/CI 4.9/2.2   ROTATOR CUFF REPAIR Right 12/09/2021   SHOULDER ARTHROSCOPY Left 03/14/2019   Procedure: LEFT SHOULDER ARTHROSCOPY, DEBRIDEMENT, AND DECOMPRESSION;  Surgeon: Newt Minion, MD;  Location: [facility];  Service: Orthopedics;  Laterality: Left;   TOTAL KNEE ARTHROPLASTY Right 06/29/2017   Procedure: RIGHT TOTAL KNEE ARTHROPLASTY;  Surgeon: Newt Minion, MD;  Location: [facility];  Service: Orthopedics;  Laterality: Right;   TOTAL KNEE ARTHROPLASTY Left 12/07/2017   TOTAL KNEE ARTHROPLASTY Left 12/07/2017    Procedure: LEFT TOTAL KNEE ARTHROPLASTY;  Surgeon: Newt Minion, MD;  Location: [facility];  Service: Orthopedics;  Laterality: Left;   TRACHEOSTOMY TUBE PLACEMENT N/A 09/20/2017   Procedure: AWAKE INTUBATION WITH ANESTHESIA WITH VIDEO ASSISTANCE;  Surgeon: Helayne Seminole, MD;  Location: [facility];  Service: ENT;  Laterality: N/A;   TRANSTHORACIC ECHOCARDIOGRAM  08/2014   Normal LV size and function.  Mild LVH.  EF 55-60%.  Normal regional wall motion.  GR 1 DD.  Normal RV size and function .   TRANSTHORACIC ECHOCARDIOGRAM  08/12/2020    [facility]): Normal LV size and function.  EF 55 to 60%.  No RWM A.  Normal filling pattern.  Normal RV.  No significant valvular disease-stenosis or regurgitation.  No vegetation.   TUBAL LIGATION     with reversal in [place]       Social History:   reports that she has never smoked. She has never used smokeless tobacco. She reports that she does not drink alcohol and does not use drugs.   Family History:  Her family history includes Asthma in her mother; Breast cancer in her mother; COPD in her mother; Coronary artery disease in her father; Diabetes in her brother, father, and mother; Emphysema in her mother; Heart attack in her maternal grandfather; Heart attack (age of onset: 80) in her father; Heart disease in her father; Heart failure in her father and mother; Hypertension in her father; Kidney disease in her mother; Lung cancer in her brother; Obesity in her father; Sarcoidosis in her maternal uncle; Sleep apnea in her father; Throat cancer in her father; Thyroid disease in her mother. There is no history of Colon cancer, Colon polyps, Esophageal cancer, Rectal cancer, or Stomach cancer.   Allergies Allergies  Allergen Reactions   Methotrexate Other (See Comments)    Peri-oral and buccal lesions   Vancomycin Other (See Comments)    DOSE RELATED NEPHROTOXICITY Other reaction(s):  other   Lisinopril Cough    Other reaction(s): other   Chlorhexidine Itching   Clindamycin/Lincomycin Nausea And Vomiting and Rash   Lincomycin Nausea Only and Rash  Other reaction(s): vomiting   Teflaro [Ceftaroline] Rash and Other (See Comments)    Tolerates ceftriaxone      Home Medications  Prior to Admission medications   Medication Sig Start Date End Date Taking? Authorizing Provider  albuterol (PROVENTIL HFA;VENTOLIN HFA) 108 (90 Base) MCG/ACT inhaler Inhale 1-2 puffs into the lungs every 6 (six) hours as needed for wheezing or shortness of breath. 01/04/19   [name], MD  albuterol (PROVENTIL) (2.5 MG/3ML) 0.083% nebulizer solution USE 1 VIAL VIA NEBULIZXER EVERY 6 HOURS AS NEEDED FOR WHEEZING OR SHORTNESS OF BREATH    [provider]  allopurinol (ZYLOPRIM) 300 MG tablet TAKE 1 TABLET BY MOUTH EVERY DAY 12/17/21   Aundra Dubin, PA-C  amoxicillin-clavulanate (AUGMENTIN) 875-125 MG tablet Take 1 tablet by mouth 2 (two) times daily. 04/28/22   Tawnya Crook, MD  aspirin EC 81 MG tablet Take 1 tablet (81 mg total) by mouth daily. 11/03/20   Leonie Man, MD  atorvastatin (LIPITOR) 40 MG tablet TAKE 1 TABLET BY MOUTH EVERY DAY 08/31/21   [name], MD  benzonatate (TESSALON PERLES) 100 MG capsule Take 2 capsules (200 mg total) by mouth 3 (three) times daily as needed for cough. 05/25/22   Jeanie Sewer, NP  BREO ELLIPTA 200-25 MCG/INH AEPB TAKE 1 PUFF BY MOUTH EVERY DAY 06/04/19   [name], MD  buPROPion (WELLBUTRIN XL) 300 MG 24 hr tablet TAKE 1 TABLET BY MOUTH EVERY DAY 01/04/22   [name], MD  carvedilol (COREG) 25 MG tablet Take 1 tablet by mouth 2 (two) times daily with a meal.    [provider]  Cholecalciferol 25 MCG (1000 UT) capsule Take 1,000 Units by mouth in the morning and at bedtime. 10/26/11   [provider]  Continuous Blood Gluc Sensor (FREESTYLE LIBRE 3 SENSOR) MISC 1 Device by Does not apply route  every 14 (fourteen) days. Apply 1 sensor on upper arm every 14 days for continuous glucose monitoring 05/03/22   Elayne Snare, MD  diclofenac Sodium (VOLTAREN) 1 % GEL Apply 1 application topically as needed (pain.).     [provider]  diltiazem (CARDIZEM CD) 120 MG 24 hr capsule TAKE 1 CAPSULE BY MOUTH EVERY DAY 01/28/22   Leonie Man, MD  Dulaglutide (TRULICITY) 4.5 PI/9.5JO SOPN Inject 4.5 mg as directed once a week. 02/02/22   Renato Shin, MD  famotidine (PEPCID) 20 MG tablet Take 1 tablet (20 mg total) by mouth at bedtime. 12/13/18   Willia Craze, NP  fenofibrate 160 MG tablet TAKE 1 TABLET BY MOUTH EVERY DAY 01/04/22   Leonie Man, MD  fluticasone [facility]) 50 MCG/ACT nasal spray Place 2 sprays into both nostrils daily as needed for allergies. 01/22/20   [name], MD  furosemide (LASIX) 40 MG tablet TAKE 1 TABLET BY MOUTH TWICE A DAY 12/17/21   [name], MD  gabapentin (NEURONTIN) 100 MG capsule TAKE 1 CAPSULE BY MOUTH THREE TIMES A DAY 02/02/22   [name], MD  gabapentin (NEURONTIN) 300 MG capsule Take at night in addition to the '100mg'$  gabapentin to get '400mg'$  at night and '100mg'$  in the morning and at mid day. 03/29/22   Gregor Hams, MD  glucose blood (CONTOUR NEXT TEST) test strip 1 each by Other route 4 (four) times daily. And lancets 4/day 09/19/19   Renato Shin, MD  HYDROcodone-acetaminophen (NORCO/VICODIN) 5-325 MG tablet Take 1 tablet by mouth every 6 (six)  hours as needed. 03/29/22   Gregor Hams, MD  insulin aspart (NOVOLOG FLEXPEN) 100 UNIT/ML FlexPen Inject 15 Units into the skin daily with supper. 11/06/21   Renato Shin, MD  insulin glargine (LANTUS SOLOSTAR) 100 UNIT/ML Solostar Pen Inject 16 Units into the skin every morning. 07/30/21   Renato Shin, MD  Insulin Pen Needle (PEN NEEDLES) 32G X 4 MM MISC 1 each by Does not apply route 4 (four) times daily. E11.9 02/22/20   Renato Shin, MD  ipratropium (ATROVENT) 0.03 % nasal spray PLACE  2 SPRAYS INTO BOTH NOSTRILS EVERY 12 (TWELVE) HOURS. 03/11/21   [name], MD  KLOR-CON M20 20 MEQ tablet TAKE 1 & 1/2 TABLETS BY MOUTH TWICE A DAY 01/27/22   [name], MD  lactulose (CHRONULAC) 10 GM/15ML solution TAKE 15 MLS BY MOUTH DAILY AS NEEDED FOR CONSTIPATION. 10/07/20   [name], MD  Lancet Devices (EASY MINI EJECT LANCING DEVICE) MISC Use as directed 4 times daily to test blood sugar 11/04/20   Renato Shin, MD  LINZESS 290 MCG CAPS capsule TAKE 1 CAPSULE BY MOUTH DAILY BEFORE BREAKFAST. 09/07/21   Irene Shipper, MD  LORazepam (ATIVAN) 0.5 MG tablet Take 0.5 mg by mouth every 12 (twelve) hours as needed for anxiety.    [provider]  meloxicam (MOBIC) 15 MG tablet Take 1 tablet (15 mg total) by mouth daily. 06/24/21   Marybelle Killings, MD  metFORMIN (GLUCOPHAGE-XR) 500 MG 24 hr tablet Take 4 tablets (2,000 mg total) by mouth daily with breakfast. 05/05/22   Elayne Snare, MD  metoCLOPramide (REGLAN) 10 MG tablet Take 1 tablet (10 mg total) by mouth every 6 (six) hours. 4/0/98   Delora Fuel, MD  Microlet Lancets MISC 1 each by Does not apply route 2 (two) times daily. E11.9 09/19/19   Renato Shin, MD  nitroGLYCERIN (NITROSTAT) 0.4 MG SL tablet Place 1 tablet (0.4 mg total) under the tongue every 5 (five) minutes as needed for chest pain. Reported on 01/05/2016 01/10/18   Leonie Man, MD  nystatin (MYCOSTATIN) 100000 UNIT/ML suspension nystatin 100,000 unit/mL oral suspension  SWISH, GARGLE, AND SPIT 20 ML 4 TIMES A DAY UNTIL RESOLVED    [provider]  ondansetron (ZOFRAN-ODT) 4 MG disintegrating tablet Take 1 tablet (4 mg total) by mouth every 8 (eight) hours as needed for nausea or vomiting. 11/17/21   Renato Shin, MD  oxyCODONE-acetaminophen (PERCOCET/ROXICET) 5-325 MG tablet Take 1 tablet by mouth every 4 (four) hours as needed. 12/10/21   [provider]  pantoprazole (PROTONIX) 40 MG tablet Take 1 tablet (40 mg total) by mouth daily.  04/29/22   [name], MD  phenazopyridine (PYRIDIUM) 100 MG tablet Take 1 tablet (100 mg total) by mouth 3 (three) times daily as needed for pain. 01/13/22   Tawnya Crook, MD  telmisartan (MICARDIS) 20 MG tablet TAKE 1 TABLET BY MOUTH EVERY DAY 01/04/22   [name], MD  traMADol (ULTRAM) 50 MG tablet TAKE 1 TABLET BY MOUTH EVERY 12 HOURS AS NEEDED FOR MODERATE TO SEVERE PAIN    [provider]  triamcinolone cream (KENALOG) 0.1 % APPLY TOPICALLY TO RASH 2 TIMES DAILY AS NEEDED. STOP USE AFTER 7-10 DAYS    [provider]  venlafaxine XR (EFFEXOR-XR) 75 MG 24 hr capsule TAKE 1 CAPSULE BY MOUTH TWICE A DAY 01/28/22   [name], MD  mupirocin nasal ointment (BACTROBAN) 2 % Place 1 application into the  nose 2 (two) times daily. Use one-half of tube in each nostril twice daily for five (5) days. After application, press sides of nose together and gently massage. 03/28/18 03/28/18  [name], MD     Critical care time: 35 min

## 2022-06-06 NOTE — Assessment & Plan Note (Signed)
Calculated BMI 35.8

## 2022-06-06 NOTE — Assessment & Plan Note (Addendum)
Hyperglycemia  Capillary glucose 374, 255, 235 and 218.  Continue insulin sliding scale for glucose cover and monitoring. Increase intensity of sliding scale.  Decrease dose steroids, for now will keep basal insulin to 20 units and will add pre meal coverage. .   Diabetic neuropathy, continue with gabapentin.

## 2022-06-06 NOTE — Assessment & Plan Note (Signed)
No clinical signs of decompensated heart failure Plan to hold on diuretic therapy for now.  Continue blood pressure monitoring.

## 2022-06-06 NOTE — ED Notes (Signed)
Intensivist at bedside.

## 2022-06-06 NOTE — Progress Notes (Signed)
Progress Note   Patient: [name] TDV:[id number] DOB: 1961-07-04 DOA: 06/05/2022     0 DOS: the patient was seen and examined on 06/06/2022   Brief hospital course: [name] was admitted to the hospital with the working diagnosis of dyspnea.   61 yo female with the past medical history of hypertension, chronic bronchitis, sarcoidosis, diastolic heart failure, GERD and T2DM who presented with dyspnea. Reported 3 weeks of non productive cough, that was treated with azithromycin with no improvement in her symptoms. Over last few days her dyspnea was associated with dyspnea and wheezing prompting her to come to the ED. On her initial physical examination her blood pressure was 159/79, HR 87, RR 27 and 02 saturation 97%, lungs with prolonged expiratory phase, upper airway wheezing (expiratory), heart with S1 and S2 present and rhythmic with no gallops, abdomen with no distention, no lower extremity edema.   NA 137, K 4,3 Cl 101, bicarbonate 24, glucose 191, bun 22 cr 1,21 High sensitive troponin 5 and 5  Wbc 13,5 hgb 10,7 plt 372  Sars covid 19 negative   Chest radiograph with no infiltrates. CT chest with no pulmonary embolism, with no infiltrates.  CT neck soft tissue no acute changes, narrowing of the laryngeal airway is favored to be incidental possible phonation.   EKG 93 bpm, normal axis, normal intervals, sinus rhythm, small q led II, III, AvF, lead V5 and V6 with no significant ST segment or T wave changes.   Patient was evaluated by ENT, fiberoptic examination was normal. Normal vocal cord motion and open airway.   Assessment and Plan: COPD with acute exacerbation ([place]) Patient with no oxygen desaturation, respiratory failure ruled out. Positive prolonged expiratory phase and mild expiratory wheezing ENT evaluation with no upper airway compromise.   Plan to continue medical therapy with bronchodilator therapy  Add systemic corticosteroids Continue with inhaled  corticosteroids Antitussive therapy and as needed lorazepam, (increase dose to 1 mg and use as needed tid).  Continue oxymetry monitoring.  Chronic diastolic CHF (congestive heart failure) (HCC) No clinical signs of decompensated heart failure Plan to hold on diuretic therapy for now.  Continue blood pressure monitoring.   CKD (chronic kidney disease) stage 3, GFR 30-59 ml/min (HCC) Renal function with serum cr at 1,18 Plan to hold on diuretic therapy and follow up renal function in am Avoid hypotension or nephrotoxic medications.   Hypertension associated with diabetes ([place]) Continue blood pressure monitoring.  Continue with diltiazem, carvedilol and irbersartan.   Hold on furosemide for now.   Type II diabetes mellitus with renal manifestations (HCC) Continue insulin sliding scale for glucose cover and monitoring Patient is tolerating po well.  Continue with basal insulin  Diabetic neuropathy, continue with gabapentin.   Major depression in full remission ([place]) Plan to continue with venlafaxine and bupropion Continue with as needed lorazepam.   Class 2 obesity Calculated BMI 35.8         Subjective: Patient continue to have dyspnea and cough, not back to her baseline   Physical Exam: Vitals:   06/06/22 1030 06/06/22 1100 06/06/22 1136 06/06/22 1300  BP: (!) 124/96 (!) 130/56  (!) 169/42  Pulse: (!) 112 (!) 103  84  Resp: (!) 30 (!) 21  (!) 24  Temp:   98 F (36.7 C)   TempSrc:   Oral   SpO2: 97% 96%  99%  Weight:      Height:       Neurology awake and alert ENT with  no pallor, no stridor Cardiovascular with S1 and S2 present and rhythmic with no gallops, rubs or murmurs Respiratory with prolonged expiratory phase and end expiratory wheezing with no rhonchi Abdomen with no distention No lower extremity edema  Data Reviewed:    Family Communication: I spoke with patient's husband at the bedside, we talked in detail about patient's condition, plan of care  and prognosis and all questions were addressed.   Disposition: Status is: Observation The patient will require care spanning > 2 midnights and should be moved to inpatient because: IV steroids and oxymetry monitoring, high risk to develop respiratory failure   Planned Discharge Destination: Home    Author: Tawni Millers, MD 06/06/2022 1:31 PM  For on call review [email or web address].

## 2022-06-06 NOTE — ED Notes (Signed)
Pt transferred from [facility] via [facility] for ENT evaluation. Pt AO, VSS, 20G R FA

## 2022-06-06 NOTE — Consult Note (Signed)
Reason for Consult:stidor Referring Physician: [name] is an 61 y.o. female.  HPI: hx of cough for weeks and started wheezing today. She has SOB. Normal voice. No pain or soreness. She is admitted by CCM. No dysphagia.   Past Medical History:  Diagnosis Date   Abnormal SPEP 04/17/2014   Acute left ankle pain 01/26/2017   ANEMIA-UNSPECIFIED 09/18/2009   Anxiety    Arthritis    Blood transfusion without reported diagnosis    Cataract    CHF (congestive heart failure) (HCC)    Chronic diastolic heart failure, NYHA class 2 (HCC)    Normal LVEDP by May 2018   COPD (chronic obstructive pulmonary disease) ([place])    Depression    DIABETES MELLITUS, TYPE II 08/21/2006   Diabetic osteomyelitis ([place]) 05/29/2015   Fatty liver    Fracture of 5th metatarsal    non union   GERD 95/06/3266   Had fundoplication   GOUT 12/45/8099   Hammer toe of right foot    2-5th toes   Hx of umbilical hernia repair    HYPERLIPIDEMIA 08/21/2006   HYPERTENSION 08/21/2006   Infection of wound due to methicillin resistant Staphylococcus aureus (MRSA)    Internal hemorrhoids    Kidney problem    Multiple allergies 10/14/2016   OBESITY 06/04/2009   Onychomycosis 10/27/2015   Osteomyelitis of left foot ([place]) 05/29/2015   Osteoporosis    Pulmonary sarcoidosis ([place])    Followed locally by pulmonology, but also by Dr. Casper Harrison at [facility]   Right knee pain 01/26/2017   Vocal cord dysfunction    Wears partial dentures     Past Surgical History:  Procedure Laterality Date   ABDOMINAL HYSTERECTOMY     APPENDECTOMY     BLADDER SUSPENSION  11/11/2011   Procedure: TRANSVAGINAL TAPE (TVT) PROCEDURE;  Surgeon: Olga Millers, MD;  Location: [facility];  Service: Gynecology;  Laterality: N/A;   CAROTIDS  02/18/2011   CAROTID DUPLEX; VERTEBRALS ARE PATENT WITH ANTEGRADE FLOW. ICA/CCA RATIO 1.61 ON RIGHT AND 0.75 ON LEFT   CATARACT EXTRACTION, BILATERAL     CHOLECYSTECTOMY  1984   COLONOSCOPY   04/29/2010   [name]   CYSTOSCOPY  11/11/2011   Procedure: CYSTOSCOPY;  Surgeon: Olga Millers, MD;  Location: [facility];  Service: Gynecology;  Laterality: N/A;   EXTUBATION (ENDOTRACHEAL) IN OR N/A 09/23/2017   Procedure: EXTUBATION (ENDOTRACHEAL) IN OR;  Surgeon: Helayne Seminole, MD;  Location: [place];  Service: ENT;  Laterality: N/A;   FIBEROPTIC LARYNGOSCOPY AND TRACHEOSCOPY N/A 09/23/2017   Procedure: FLEXIBLE FIBEROPTIC LARYNGOSCOPY;  Surgeon: Helayne Seminole, MD;  Location: [place];  Service: ENT;  Laterality: N/A;   FRACTURE SURGERY     foot   [place] Left 10/02/2018   Procedure: HALLUX ARTHRODESIS;  Surgeon: Edrick Kins, DPM;  Location: [facility];  Service: Podiatry;  Laterality: Left;   HAMMER TOE SURGERY Left 10/02/2018   Procedure: HAMMER TOE CORRECTION 2ND 3RD 4RD FIFTH TOE;  Surgeon: Edrick Kins, DPM;  Location: [facility];  Service: Podiatry;  Laterality: Left;   HAMMER TOE SURGERY Right 04/12/2019   Procedure: HAMMER TOE CORRECTION, 2ND, 3RD, 4TH AND 5TH TOES OF RIGHT FOOT;  Surgeon: Edrick Kins, DPM;  Location: [facility];  Service: Podiatry;  Laterality: Right;   HAMMER TOE SURGERY Left 10/25/2019   Procedure: HAMMER TOE REPAIR SECOND, THIRD, FOUTH;  Surgeon: Edrick Kins, DPM;  Location: [facility];  Service: Podiatry;  Laterality: Left;   HERNIA REPAIR     I & D EXTREMITY Left 06/27/2015   Procedure: Partial Excision Left Calcaneus, Place Antibiotic Beads, and Wound VAC;  Surgeon: Newt Minion, MD;  Location: [place];  Service: Orthopedics;  Laterality: Left;   JOINT REPLACEMENT     KNEE ARTHROSCOPY     right   LEFT AND RIGHT HEART CATHETERIZATION WITH CORONARY ANGIOGRAM N/A 04/23/2013   Procedure: LEFT AND RIGHT HEART CATHETERIZATION WITH CORONARY ANGIOGRAM;  Surgeon: Leonie Man, MD;  Location: [facility] CATH LAB;  Service: Cardiovascular;  Laterality: N/A;   LEFT HEART CATH AND CORONARY ANGIOGRAPHY N/A 03/11/2017   Procedure: Left Heart Cath and Coronary  Angiography;  Surgeon: Sherren Mocha, MD;  Location: [place] CV LAB; angiographically minimal CAD in the LAD otherwise normal.;  Normal LVEDP.  FALSE POSITIVE MYOVIEW   LEFT HEART CATH AND CORONARY ANGIOGRAPHY  07/20/2010   LVEF 50-55% WITH VERY MILD GLOBAL HYPOKINESIA; ESSENTIALLY NORMAL CORONARY ARTERIES; NORMAL LV FUNCTION   METATARSAL OSTEOTOMY WITH OPEN REDUCTION INTERNAL FIXATION (ORIF) METATARSAL WITH FUSION Left 04/09/2014   Procedure: LEFT FOOT FRACTURE OPEN TREATMENT METATARSAL INCLUDES INTERNAL FIXATION EACH;  Surgeon: Lorn Junes, MD;  Location: [place];  Service: Orthopedics;  Laterality: Left;   NISSEN FUNDOPLICATION  4268   NM MYOVIEW LTD --> FALSE POSITIVE  03/10/2017   : Moderate size "stress-induced "perfusion defect at the apex as well as "ill-defined stress-induced perfusion defect in the lateral wall.  EF 55%.  INTERMEDIATE risk. -->  FALSE POSITIVE   ORIF DISTAL FEMUR FRACTURE  08/08/2020   [facility]: ORIF of left extra-articular periprosthetic distal femur fracture with synthesis of the femur plate with cortical nonlocking screws.  (Thought to be pathologic fracture due to osteoporosis) -> after a fall   ORIF FEMUR W/ PERI-IMPLANT  08/29/2020   [facility]) revision of left femur fracture ORIF plate fixation screws; culture positive for MSSA   Right and left CARDIAC CATHETERIZATION  04/23/2013   Angiographic normal coronaries; LVEDP 20 mmHg, PCWP 12-14 mmHg, RAP 12 mmHg.; Fick CO/CI 4.9/2.2   ROTATOR CUFF REPAIR Right 12/09/2021   SHOULDER ARTHROSCOPY Left 03/14/2019   Procedure: LEFT SHOULDER ARTHROSCOPY, DEBRIDEMENT, AND DECOMPRESSION;  Surgeon: Newt Minion, MD;  Location: [place];  Service: Orthopedics;  Laterality: Left;   TOTAL KNEE ARTHROPLASTY Right 06/29/2017   Procedure: RIGHT TOTAL KNEE ARTHROPLASTY;  Surgeon: Newt Minion, MD;  Location: [place];  Service: Orthopedics;  Laterality: Right;   TOTAL KNEE  ARTHROPLASTY Left 12/07/2017   TOTAL KNEE ARTHROPLASTY Left 12/07/2017   Procedure: LEFT TOTAL KNEE ARTHROPLASTY;  Surgeon: Newt Minion, MD;  Location: [place];  Service: Orthopedics;  Laterality: Left;   TRACHEOSTOMY TUBE PLACEMENT N/A 09/20/2017   Procedure: AWAKE INTUBATION WITH ANESTHESIA WITH VIDEO ASSISTANCE;  Surgeon: Helayne Seminole, MD;  Location: [facility];  Service: ENT;  Laterality: N/A;   TRANSTHORACIC ECHOCARDIOGRAM  08/2014   Normal LV size and function.  Mild LVH.  EF 55-60%.  Normal regional wall motion.  GR 1 DD.  Normal RV size and function .   TRANSTHORACIC ECHOCARDIOGRAM  08/12/2020    [facility]): Normal LV size and function.  EF 55 to 60%.  No RWM A.  Normal filling pattern.  Normal RV.  No significant valvular disease-stenosis or regurgitation.  No vegetation.   TUBAL LIGATION     with reversal in [place]  VENTRAL HERNIA REPAIR      Family History  Problem Relation Age of Onset   Diabetes Father    Heart attack Father 62   Coronary artery disease Father    Heart failure Father    Throat cancer Father    Hypertension Father    Heart disease Father    Sleep apnea Father    Obesity Father    COPD Mother    Emphysema Mother    Asthma Mother    Heart failure Mother    Breast cancer Mother    Diabetes Mother    Kidney disease Mother    Thyroid disease Mother    Heart attack Maternal Grandfather    Sarcoidosis Maternal Uncle    Lung cancer Brother    Diabetes Brother    Colon cancer Neg Hx    Colon polyps Neg Hx    Esophageal cancer Neg Hx    Rectal cancer Neg Hx    Stomach cancer Neg Hx     Social History:  reports that she has never smoked. She has never used smokeless tobacco. She reports that she does not drink alcohol and does not use drugs.  Allergies:  Allergies  Allergen Reactions   Methotrexate Other (See Comments)    Peri-oral and buccal lesions   Vancomycin Other (See Comments)    DOSE  RELATED NEPHROTOXICITY Other reaction(s): other   Lisinopril Cough    Other reaction(s): other   Chlorhexidine Itching   Clindamycin/Lincomycin Nausea And Vomiting and Rash   Lincomycin Nausea Only and Rash    Other reaction(s): vomiting   Teflaro [Ceftaroline] Rash and Other (See Comments)    Tolerates ceftriaxone     Medications: I have reviewed the patient's current medications.  Results for orders placed or performed during the hospital encounter of 06/05/22 (from the past 48 hour(s))  Basic metabolic panel     Status: Abnormal   Collection Time: 06/05/22  6:42 PM  Result Value Ref Range   Sodium 137 135 - 145 mmol/L   Potassium 4.3 3.5 - 5.1 mmol/L   Chloride 101 98 - 111 mmol/L   CO2 24 22 - 32 mmol/L   Glucose, Bld 191 (H) 70 - 99 mg/dL    Comment: Glucose reference range applies only to samples taken after fasting for at least 8 hours.   BUN 22 8 - 23 mg/dL   Creatinine, Ser 1.21 (H) 0.44 - 1.00 mg/dL   Calcium 9.6 8.9 - 10.3 mg/dL   GFR, Estimated 51 (L) >60 mL/min    Comment: (NOTE) Calculated using the CKD-EPI Creatinine Equation (2021)    Anion gap 12 5 - 15    Comment: Performed at [facility], [address], [place], [place] [address]  CBC     Status: Abnormal   Collection Time: 06/05/22  6:42 PM  Result Value Ref Range   WBC 13.5 (H) 4.0 - 10.5 K/uL   RBC 3.53 (L) 3.87 - 5.11 MIL/uL   Hemoglobin 10.7 (L) 12.0 - 15.0 g/dL   HCT 32.6 (L) 36.0 - 46.0 %   MCV 92.4 80.0 - 100.0 fL   MCH 30.3 26.0 - 34.0 pg   MCHC 32.8 30.0 - 36.0 g/dL   RDW 13.4 11.5 - 15.5 %   Platelets 372 150 - 400 K/uL   nRBC 0.0 0.0 - 0.2 %    Comment: Performed at [facility], [place], [name] [address]  Troponin I (High Sensitivity)  Status: None   Collection Time: 06/05/22  6:42 PM  Result Value Ref Range   Troponin I (High Sensitivity) 5 <18 ng/L    Comment: (NOTE) Elevated high sensitivity troponin I (hsTnI) values  and significant  changes across serial measurements may suggest ACS but many other  chronic and acute conditions are known to elevate hsTnI results.  Refer to the "Links" section for chest pain algorithms and additional  guidance. Performed at [facility], [place], [place] [address]   Troponin I (High Sensitivity)     Status: None   Collection Time: 06/05/22  8:50 PM  Result Value Ref Range   Troponin I (High Sensitivity) 5 <18 ng/L    Comment: (NOTE) Elevated high sensitivity troponin I (hsTnI) values and significant  changes across serial measurements may suggest ACS but many other  chronic and acute conditions are known to elevate hsTnI results.  Refer to the "Links" section for chest pain algorithms and additional  guidance. Performed at [facility], [address], [place], [place] [address]   Resp Panel by RT-PCR (Flu A&B, Covid) Anterior Nasal Swab     Status: None   Collection Time: 06/06/22  2:51 AM   Specimen: Anterior Nasal Swab  Result Value Ref Range   SARS Coronavirus 2 by RT PCR NEGATIVE NEGATIVE    Comment: (NOTE) SARS-CoV-2 target nucleic acids are NOT DETECTED.  The SARS-CoV-2 RNA is generally detectable in upper respiratory specimens during the acute phase of infection. The lowest concentration of SARS-CoV-2 viral copies this assay can detect is 138 copies/mL. A negative result does not preclude SARS-Cov-2 infection and should not be used as the sole basis for treatment or other patient management decisions. A negative result may occur with  improper specimen collection/handling, submission of specimen other than nasopharyngeal swab, presence of viral mutation(s) within the areas targeted by this assay, and inadequate number of viral copies(<138 copies/mL). A negative result must be combined with clinical observations, patient history, and epidemiological information. The expected result is  Negative.  Fact Sheet for Patients:  EntrepreneurPulse.com.au  Fact Sheet for Healthcare Providers:  IncredibleEmployment.be  This test is no t yet approved or cleared by the [place] FDA and  has been authorized for detection and/or diagnosis of SARS-CoV-2 by FDA under an Emergency Use Authorization (EUA). This EUA will remain  in effect (meaning this test can be used) for the duration of the COVID-19 declaration under Section 564(b)(1) of the Act, 21 U.S.C.section 360bbb-3(b)(1), unless the authorization is terminated  or revoked sooner.       Influenza A by PCR NEGATIVE NEGATIVE   Influenza B by PCR NEGATIVE NEGATIVE    Comment: (NOTE) The Xpert Xpress SARS-CoV-2/FLU/RSV plus assay is intended as an aid in the diagnosis of influenza from Nasopharyngeal swab specimens and should not be used as a sole basis for treatment. Nasal washings and aspirates are unacceptable for Xpert Xpress SARS-CoV-2/FLU/RSV testing.  Fact Sheet for Patients: EntrepreneurPulse.com.au  Fact Sheet for Healthcare Providers: IncredibleEmployment.be  This test is not yet approved or cleared by the [place] FDA and has been authorized for detection and/or diagnosis of SARS-CoV-2 by FDA under an Emergency Use Authorization (EUA). This EUA will remain in effect (meaning this test can be used) for the duration of the COVID-19 declaration under Section 564(b)(1) of the Act, 21 U.S.C. section 360bbb-3(b)(1), unless the authorization is terminated or revoked.  Performed at [facility], [place] [address], [place], [name]  [address]   CBG monitoring, ED     Status: Abnormal   Collection Time: 06/06/22  4:35 AM  Result Value Ref Range   Glucose-Capillary 261 (H) 70 - 99 mg/dL    Comment: Glucose reference range applies only to samples taken after fasting for at least 8 hours.  HIV Antibody (routine testing w rflx)      Status: None   Collection Time: 06/06/22  4:43 AM  Result Value Ref Range   HIV Screen 4th Generation wRfx Non Reactive Non Reactive    Comment: Performed at [facility], [address] [place], [place] [address]  Basic metabolic panel     Status: Abnormal   Collection Time: 06/06/22  4:43 AM  Result Value Ref Range   Sodium 135 135 - 145 mmol/L   Potassium 4.1 3.5 - 5.1 mmol/L   Chloride 100 98 - 111 mmol/L   CO2 24 22 - 32 mmol/L   Glucose, Bld 274 (H) 70 - 99 mg/dL    Comment: Glucose reference range applies only to samples taken after fasting for at least 8 hours.   BUN 22 8 - 23 mg/dL   Creatinine, Ser 1.18 (H) 0.44 - 1.00 mg/dL   Calcium 9.2 8.9 - 10.3 mg/dL   GFR, Estimated 52 (L) >60 mL/min    Comment: (NOTE) Calculated using the CKD-EPI Creatinine Equation (2021)    Anion gap 11 5 - 15    Comment: Performed at [facility] [address], [place], [name] [address]  CBC     Status: Abnormal   Collection Time: 06/06/22  4:43 AM  Result Value Ref Range   WBC 9.8 4.0 - 10.5 K/uL   RBC 3.50 (L) 3.87 - 5.11 MIL/uL   Hemoglobin 10.8 (L) 12.0 - 15.0 g/dL   HCT 33.0 (L) 36.0 - 46.0 %   MCV 94.3 80.0 - 100.0 fL   MCH 30.9 26.0 - 34.0 pg   MCHC 32.7 30.0 - 36.0 g/dL   RDW 13.3 11.5 - 15.5 %   Platelets 339 150 - 400 K/uL   nRBC 0.0 0.0 - 0.2 %    Comment: Performed at [facility], [place] [address], [place], [place] [address]  CBG monitoring, ED     Status: Abnormal   Collection Time: 06/06/22  8:28 AM  Result Value Ref Range   Glucose-Capillary 238 (H) 70 - 99 mg/dL    Comment: Glucose reference range applies only to samples taken after fasting for at least 8 hours.   Comment 1 Notify RN    Comment 2 Document in Chart    *Note: Due to a large number of results and/or encounters for the requested time period, some results have not been displayed. A complete set of results can be found in Results Review.    CT Soft Tissue Neck W Contrast  Result  Date: 06/05/2022 CLINICAL DATA:  Cough with abnormal breath sounds EXAM: CT NECK WITH CONTRAST TECHNIQUE: Multidetector CT imaging of the neck was performed using the standard protocol following the bolus administration of intravenous contrast. RADIATION DOSE REDUCTION: This exam was performed according to the departmental dose-optimization program which includes automated exposure control, adjustment of the mA and/or kV according to patient size and/or use of iterative reconstruction technique. CONTRAST:  132m OMNIPAQUE IOHEXOL 350 MG/ML SOLN COMPARISON:  09/20/2017 FINDINGS: PHARYNX AND LARYNX: The pharynx and epiglottis are normal. Narrowing of the laryngeal airway is favored to be due to phonation. The  vocal cords do not appear edematous. SALIVARY GLANDS: Normal parotid, submandibular and sublingual glands. THYROID: Normal. LYMPH NODES: No enlarged or abnormal density lymph nodes. VASCULAR: Major cervical vessels are patent. LIMITED INTRACRANIAL: Normal. VISUALIZED ORBITS: Normal. MASTOIDS AND VISUALIZED PARANASAL SINUSES: No fluid levels or advanced mucosal thickening. No mastoid effusion. SKELETON: No bony spinal canal stenosis. No lytic or blastic lesions. UPPER CHEST: Clear. OTHER: None. IMPRESSION: 1. No acute abnormality of the neck. 2. Narrowing of the laryngeal airway is favored to be incidental, possibly phonation. If there is significant respiratory stridor, consider direct laryngoscopic assessment. Electronically Signed   By: Ulyses Jarred M.D.   On: 06/05/2022 22:13   CT Angio Chest PE W and/or Wo Contrast  Result Date: 06/05/2022 CLINICAL DATA:  Cough. EXAM: CT ANGIOGRAPHY CHEST WITH CONTRAST TECHNIQUE: Multidetector CT imaging of the chest was performed using the standard protocol during bolus administration of intravenous contrast. Multiplanar CT image reconstructions and MIPs were obtained to evaluate the vascular anatomy. RADIATION DOSE REDUCTION: This exam was performed according to the  departmental dose-optimization program which includes automated exposure control, adjustment of the mA and/or kV according to patient size and/or use of iterative reconstruction technique. CONTRAST:  121m OMNIPAQUE IOHEXOL 350 MG/ML SOLN COMPARISON:  CT chest 01/24/2014 FINDINGS: Cardiovascular: Heart is enlarged. Aorta is normal in size. There is no pericardial effusion. There are atherosclerotic calcifications of the aorta and coronary arteries. There is adequate opacification of the pulmonary arteries to the segmental level. There is no pulmonary embolism identified. Main pulmonary artery is enlarged. Mediastinum/Nodes: No enlarged mediastinal, hilar, or axillary lymph nodes. Thyroid gland, trachea, and esophagus demonstrate no significant findings. Lungs/Pleura: Lungs are clear. No pleural effusion or pneumothorax. Upper Abdomen: There surgical clips in the proximal stomach. Musculoskeletal: There are healed right-sided rib fractures. No acute fractures are seen. Review of the MIP images confirms the above findings. IMPRESSION: 1. No evidence for pulmonary embolism. 2. Cardiomegaly. Enlarged main pulmonary artery can be seen with pulmonary artery hypertension. Electronically Signed   By: ARonney AstersM.D.   On: 06/05/2022 21:59   DG Chest Port 1 View  Result Date: 06/05/2022 CLINICAL DATA:  Nonproductive cough EXAM: PORTABLE CHEST 1 VIEW COMPARISON:  12/03/2021 FINDINGS: Heart and mediastinal contours are within normal limits. No focal opacities or effusions. No acute bony abnormality. IMPRESSION: No active disease. Electronically Signed   By: KRolm BaptiseM.D.   On: 06/05/2022 20:13    ROS Blood pressure (!) 154/90, pulse 100, temperature 98.2 F (36.8 C), temperature source Oral, resp. rate 20, height '5\' 6"'$  (1.676 m), weight 100.8 kg, SpO2 99 %. Physical Exam HENT:     Nose: Nose normal.     Mouth/Throat:     Mouth: Mucous membranes are moist.     Comments: Fibroptic exam shows normal NP. The  larynx looks normal. TVC move normal. The glottis has no lesions or reason for shortness of breath. I could hear some wheezing with wide open  glottis indicating coming from lower.  Rest of pharynx and larynx normal Eyes:     Extraocular Movements: Extraocular movements intact.     Pupils: Pupils are equal, round, and reactive to light.  Musculoskeletal:     Cervical back: Normal range of motion.  Neurological:     Mental Status: She is alert.       Assessment/Plan: Stridor- hx of cough and had several treatments. It has been going on for several weeks. [facility]  started wheezing. She had a laryngeal swelling 4 years ago. Not same as this problem. No pain. No sore throat. She is short of breath. CT scan with possible glottic swelling. No tracheal narrowing at the upper airway. Fiberoptic was normal. Normal vocal cord motion and open airway. Will let pulmonary work up the shortness of breath as it is not explained by upper airway.   [name] 06/06/2022, 9:18 AM

## 2022-06-07 ENCOUNTER — Telehealth: Payer: Self-pay | Admitting: Pulmonary Disease

## 2022-06-07 ENCOUNTER — Ambulatory Visit: Payer: BC Managed Care – PPO | Admitting: Family Medicine

## 2022-06-07 DIAGNOSIS — J441 Chronic obstructive pulmonary disease with (acute) exacerbation: Secondary | ICD-10-CM | POA: Diagnosis not present

## 2022-06-07 DIAGNOSIS — I5032 Chronic diastolic (congestive) heart failure: Secondary | ICD-10-CM | POA: Diagnosis not present

## 2022-06-07 DIAGNOSIS — E1122 Type 2 diabetes mellitus with diabetic chronic kidney disease: Secondary | ICD-10-CM | POA: Diagnosis not present

## 2022-06-07 DIAGNOSIS — N1831 Chronic kidney disease, stage 3a: Secondary | ICD-10-CM | POA: Diagnosis not present

## 2022-06-07 LAB — BASIC METABOLIC PANEL
Anion gap: 11 (ref 5–15)
BUN: 30 mg/dL — ABNORMAL HIGH (ref 8–23)
CO2: 25 mmol/L (ref 22–32)
Calcium: 9.1 mg/dL (ref 8.9–10.3)
Chloride: 100 mmol/L (ref 98–111)
Creatinine, Ser: 1.4 mg/dL — ABNORMAL HIGH (ref 0.44–1.00)
GFR, Estimated: 43 mL/min — ABNORMAL LOW (ref >60)
Glucose, Bld: 329 mg/dL — ABNORMAL HIGH (ref 70–99)
Potassium: 4.2 mmol/L (ref 3.5–5.1)
Sodium: 136 mmol/L (ref 135–145)

## 2022-06-07 LAB — GLUCOSE, CAPILLARY
Glucose-Capillary: 285 mg/dL — ABNORMAL HIGH (ref 70–99)
Glucose-Capillary: 156 mg/dL — ABNORMAL HIGH (ref 70–99)
Glucose-Capillary: 172 mg/dL — ABNORMAL HIGH (ref 70–99)
Glucose-Capillary: 269 mg/dL — ABNORMAL HIGH (ref 70–99)
Glucose-Capillary: 319 mg/dL — ABNORMAL HIGH (ref 70–99)
Glucose-Capillary: 340 mg/dL — ABNORMAL HIGH (ref 70–99)
Glucose-Capillary: 374 mg/dL — ABNORMAL HIGH (ref 70–99)

## 2022-06-07 LAB — CBC
HCT: 31 % — ABNORMAL LOW (ref 36.0–46.0)
Hemoglobin: 10.5 g/dL — ABNORMAL LOW (ref 12.0–15.0)
MCH: 31.1 pg (ref 26.0–34.0)
MCHC: 33.9 g/dL (ref 30.0–36.0)
MCV: 91.7 fL (ref 80.0–100.0)
Platelets: 349 10*3/uL (ref 150–400)
RBC: 3.38 MIL/uL — ABNORMAL LOW (ref 3.87–5.11)
RDW: 13.1 % (ref 11.5–15.5)
WBC: 12.6 10*3/uL — ABNORMAL HIGH (ref 4.0–10.5)
nRBC: 0 % (ref 0.0–0.2)

## 2022-06-07 MED ORDER — INSULIN GLARGINE-YFGN 100 UNIT/ML ~~LOC~~ SOLN
20.0000 [IU] | Freq: Every day | SUBCUTANEOUS | Status: DC
  Administered 2022-06-08 – 2022-06-09 (×2): 20 [IU] via SUBCUTANEOUS
  Filled 2022-06-07 (×2): qty 0.2

## 2022-06-07 MED ORDER — INSULIN GLARGINE-YFGN 100 UNIT/ML ~~LOC~~ SOLN
10.0000 [IU] | Freq: Once | SUBCUTANEOUS | Status: AC
Start: 2022-06-07 — End: 2022-06-07
  Administered 2022-06-07: 10 [IU] via SUBCUTANEOUS
  Filled 2022-06-07: qty 0.1

## 2022-06-07 MED ORDER — IPRATROPIUM-ALBUTEROL 0.5-2.5 (3) MG/3ML IN SOLN
3.0000 mL | Freq: Two times a day (BID) | RESPIRATORY_TRACT | Status: DC
Start: 2022-06-07 — End: 2022-06-08
  Administered 2022-06-07 – 2022-06-08 (×2): 3 mL via RESPIRATORY_TRACT
  Filled 2022-06-07 (×2): qty 3

## 2022-06-07 NOTE — Plan of Care (Signed)
  Problem: Education: Goal: Knowledge of General Education information will improve Description: Including pain rating scale, medication(s)/side effects and non-pharmacologic comfort measures Outcome: Progressing   Problem: Health Behavior/Discharge Planning: Goal: Ability to manage health-related needs will improve Outcome: Progressing   Problem: Clinical Measurements: Goal: Ability to maintain clinical measurements within normal limits will improve Outcome: Progressing Goal: Respiratory complications will improve Outcome: Progressing   Problem: Activity: Goal: Risk for activity intolerance will decrease Outcome: Progressing   

## 2022-06-07 NOTE — Progress Notes (Signed)
NAME:  [name], MRN:  [id number], DOB:  September 26, 1961, LOS: 1 ADMISSION DATE:  06/05/2022, CONSULTATION DATE:  06/06/22 REFERRING MD:  [name] Holiday, MD CHIEF COMPLAINT:  SOB   History of Present Illness:  61 YO woman with hx of CHF, HTN, HLD, COPD, Pulmonary sarcoidosis, vocal cord spasm, epiglottitis in 2020,  presenting with progressive, worsening cough for 3 weeks, shob, and wheezing.   Patient reports that had similar cough episodes in the past that resolve with ativan, decreased talking, or slow breathing but that have not improved in the past week.     Pertinent  Medical History  Anxiety/dep CHF, diastolic COPD  Dm II GERD GOUT HTN HLD Sarcoidosis, pulmonary VCD   Med: albuterol, allopurinol, augmentin (04/28/22?), asa, tessalon, breo/ellipta, wellbutrin, coreg, vit D , dilt, trulicity, fenofibrate, fluticasone, lasix, gabapentin, norco prn, atrovent, lactulose. Ativan, meloxicam, metrormin, reglan, ntg, nystatin swish and gargle, spit, percocet, protonix, micardis, pyridium, effexor,  Significant Hospital Events: Including procedures, antibiotic start and stop dates in addition to other pertinent events   06/05/2022 CXR: No focal opacities, effusiona, or bony abnormalities 06/05/2022 CT Angio chest PE: No PE. Main pulmonary artery enlargement. No hilar, mediastinal or axillary lymph node. No pleural effusion or pneumothorax.  One surgical clip in the proximal stomach.  06/05/2022 CT soft tissue neck with contrast: no acute abnormality of the neck. Normal pharyx and epiglottis. Narrowing of the laryngeal airway likely in setting of phonation. Non edematous vocal cords 06/06/2022: ENT consulted: normal vocal cord motion per fiberoptic evaluation. No tracheal narrowing.   Interim History / Subjective:  Patient with cough today and intermittent wheezing and SOB. Able to complete sentence. Denies chest pain or difficulty swallowing today. No sputum production, fevers, chills.  She  reports that her wheezing and cough have been better here than at home, and overall better since systemic steroids.  Patient with a 20 year history of sarcoidosis on prednisone until 5 years ago. She used to be followed at [facility], where she was last seen 2 years go. Patient mentioned that she has been getting progressively, though, intermittently short of breath in the past 6 months.    [name] has also noticed increased reflux over the last few months that worsen her cough. She has a history of Nissen procedure and stopped taking PPIs since, but that recently she's been using TUMs more often.  She is unable to vomit, but does full abdominal fullness and "gaging" sensation when eating too much.   Patient also notes that she snores, falls asleep often while talking with family and friends. Family was in the room and mentioned has always seemed like she has stopped breathing at times when sleeping.  She denies a history of sleep apnea or cpap use in the past.   Objective   Blood pressure (!) 128/56, pulse 63, temperature 97.9 F (36.6 C), temperature source Oral, resp. rate 18, height '5\' 6"'$  (1.676 m), weight 99.7 kg, SpO2 94 %.        Intake/Output Summary (Last 24 hours) at 06/07/2022 1809 Last data filed at 06/07/2022 0300 Gross per 24 hour  Intake 490 ml  Output --  Net 490 ml   Filed Weights   06/05/22 1832 06/06/22 1509  Weight: 100.8 kg 99.7 kg    Examination: General: Pleasant, well-appearing sitting in bed. No acute distress. HEENT: moist mucus membranes. CV: RRR. No murmurs, rubs, or gallops. No LE edema Pulmonary: Lungs CTAB. Normal  respiratory effort. Intermittent wheezing, decreased when  patient breathing slowly or through nose. Abdominal: Soft, nontender, nondistended. Normal bowel sounds. Extremities: Palpable radial and DP pulses. Normal ROM. Skin: Warm and dry. No obvious rash or lesions. Neuro: A&Ox3. Moves all extremities. Normal sensation. No focal  deficit. Psych: Pleasant mood and affect   Assessment & Plan:   Acute bronchitis Vocal cord dysfunction GERD ?Pulmonary HTN  Dyspnea  Patient with acute on chronic bronchitis, vocal cord dysfunction, sarcoidosis  presenting with progressive SOB an intermitted wheezing. She is currently stable. Presentation likely multifactorial, but complicated by vocal cord dysfunction and increased reflux in the past few months. Discussed CT chest findings with Dr. Garey Ham, who was not able to see full Nissen fundoplication wrap. Only one staple in view. Outpatient follow up with GI for re-evaluation. While inpatient, recommend PPI trial for GERD, cough suppressants, and warm beverages to manage vocal cord dysfunction.  Presentation unlikely secondary to infections, however, agree with primary to r/o COVID as patient's husband tested positive recently.   Additionally,  given history of sarcoidosis and lapse in follow up and treatment, would also favor  prednisone course for 7 days and Albuterol inhaler PRN.   Patient also found to have enlargement of main pulmonary artery on CT. Will order echo to start pulmonary hypertension work up.  Patient has established history as potential etiology, but also an estimated STOP BANG score of 6.  -PPI -Dextromethorphan -Tessalon pearls -Warm drinks -Albuterol inhaler PRN -TTE  -Outpatient follow up with Dr. Vaughan Browner -Outpatient PFT -Outpatient polysomnography   Best Practice (right click and "Reselect all SmartList Selections" daily)   Diet/type: Regular consistency (see orders) DVT prophylaxis: LMWH GI prophylaxis: PPI Lines: N/A Foley:  N/A Code Status:  full code Last date of multidisciplinary goals of care discussion [none]  Labs   CBC: Recent Labs  Lab 06/05/22 1842 06/06/22 0443 06/07/22 0104  WBC 13.5* 9.8 12.6*  HGB 10.7* 10.8* 10.5*  HCT 32.6* 33.0* 31.0*  MCV 92.4 94.3 91.7  PLT 372 339 902    Basic Metabolic Panel: Recent Labs   Lab 06/05/22 1842 06/06/22 0443 06/07/22 0104  NA 137 135 136  K 4.3 4.1 4.2  CL 101 100 100  CO2 '24 24 25  '$ GLUCOSE 191* 274* 329*  BUN 22 22 30*  CREATININE 1.21* 1.18* 1.40*  CALCIUM 9.6 9.2 9.1   GFR: Estimated Creatinine Clearance: 49.7 mL/min (A) (by C-G formula based on SCr of 1.4 mg/dL (H)). Recent Labs  Lab 06/05/22 1842 06/06/22 0443 06/07/22 0104  WBC 13.5* 9.8 12.6*    Liver Function Tests: No results for input(s): "AST", "ALT", "ALKPHOS", "BILITOT", "PROT", "ALBUMIN" in the last 168 hours. No results for input(s): "LIPASE", "AMYLASE" in the last 168 hours. No results for input(s): "AMMONIA" in the last 168 hours.  ABG    Component Value Date/Time   PHART 7.469 (H) 09/23/2017 0325   PCO2ART 35.4 09/23/2017 0325   PO2ART 198 (H) 09/23/2017 0325   HCO3 25.4 09/23/2017 0325   TCO2 25 09/20/2017 1431   ACIDBASEDEF 1.8 09/20/2017 2042   O2SAT 99.6 09/23/2017 0325     Coagulation Profile: No results for input(s): "INR", "PROTIME" in the last 168 hours.  Cardiac Enzymes: No results for input(s): "CKTOTAL", "CKMB", "CKMBINDEX", "TROPONINI" in the last 168 hours.  HbA1C: Hemoglobin A1C  Date/Time Value Ref Range Status  02/02/2022 01:32 PM 8.8 (A) 4.0 - 5.6 % Final  12/03/2021 08:10 AM 8.7 (A) 4.0 - 5.6 % Final  08/08/2020 12:00 AM 7.9  Final  Hgb A1c MFr Bld  Date/Time Value Ref Range Status  04/29/2022 10:14 AM 8.4 (H) 4.6 - 6.5 % Final    Comment:    Glycemic Control Guidelines for People with Diabetes:Non Diabetic:  <6%Goal of Therapy: <7%Additional Action Suggested:  >8%   10/18/2019 09:19 AM 9.2 (H) 4.8 - 5.6 % Final    Comment:    (NOTE) Pre diabetes:          5.7%-6.4% Diabetes:              >6.4% Glycemic control for   <7.0% adults with diabetes     CBG: Recent Labs  Lab 06/07/22 0336 06/07/22 0718 06/07/22 1130 06/07/22 1549 06/07/22 1729  GLUCAP 285* 156* 172* 269* 319*    Review of Systems:   Pertinent per HPI and  interim history  Past Medical History:  She,  has a past medical history of Abnormal SPEP (04/17/2014), Acute left ankle pain (01/26/2017), ANEMIA-UNSPECIFIED (09/18/2009), Anxiety, Arthritis, Blood transfusion without reported diagnosis, Cataract, CHF (congestive heart failure) ([place]), Chronic diastolic heart failure, NYHA class 2 ([place]), COPD (chronic obstructive pulmonary disease) ([place]), Depression, DIABETES MELLITUS, TYPE II (08/21/2006), Diabetic osteomyelitis ([place]) (05/29/2015), Fatty liver, Fracture of 5th metatarsal, GERD (08/21/2006), GOUT (08/20/2010), Hammer toe of right foot, umbilical hernia repair, HYPERLIPIDEMIA (08/21/2006), HYPERTENSION (08/21/2006), Infection of wound due to methicillin resistant Staphylococcus aureus (MRSA), Internal hemorrhoids, Kidney problem, Multiple allergies (10/14/2016), OBESITY (06/04/2009), Onychomycosis (10/27/2015), Osteomyelitis of left foot ([place]) (05/29/2015), Osteoporosis, Pulmonary sarcoidosis ([place]), Right knee pain (01/26/2017), Vocal cord dysfunction, and Wears partial dentures.   Surgical History:   Past Surgical History:  Procedure Laterality Date   ABDOMINAL HYSTERECTOMY     APPENDECTOMY     BLADDER SUSPENSION  11/11/2011   Procedure: TRANSVAGINAL TAPE (TVT) PROCEDURE;  Surgeon: Olga Millers, MD;  Location: [facility];  Service: Gynecology;  Laterality: N/A;   CAROTIDS  02/18/2011   CAROTID DUPLEX; VERTEBRALS ARE PATENT WITH ANTEGRADE FLOW. ICA/CCA RATIO 1.61 ON RIGHT AND 0.75 ON LEFT   CATARACT EXTRACTION, BILATERAL     CHOLECYSTECTOMY  1984   COLONOSCOPY  04/29/2010   [name]   CYSTOSCOPY  11/11/2011   Procedure: CYSTOSCOPY;  Surgeon: Olga Millers, MD;  Location: [facility];  Service: Gynecology;  Laterality: N/A;   EXTUBATION (ENDOTRACHEAL) IN OR N/A 09/23/2017   Procedure: EXTUBATION (ENDOTRACHEAL) IN OR;  Surgeon: Helayne Seminole, MD;  Location: [place];  Service: ENT;  Laterality: N/A;   FIBEROPTIC LARYNGOSCOPY AND TRACHEOSCOPY N/A 09/23/2017    Procedure: FLEXIBLE FIBEROPTIC LARYNGOSCOPY;  Surgeon: Helayne Seminole, MD;  Location: [place];  Service: ENT;  Laterality: N/A;   FRACTURE SURGERY     foot   [place] Left 10/02/2018   Procedure: HALLUX ARTHRODESIS;  Surgeon: Edrick Kins, DPM;  Location: [facility];  Service: Podiatry;  Laterality: Left;   HAMMER TOE SURGERY Left 10/02/2018   Procedure: HAMMER TOE CORRECTION 2ND 3RD 4RD FIFTH TOE;  Surgeon: Edrick Kins, DPM;  Location: [facility];  Service: Podiatry;  Laterality: Left;   HAMMER TOE SURGERY Right 04/12/2019   Procedure: HAMMER TOE CORRECTION, 2ND, 3RD, 4TH AND 5TH TOES OF RIGHT FOOT;  Surgeon: Edrick Kins, DPM;  Location: [place];  Service: Podiatry;  Laterality: Right;   HAMMER TOE SURGERY Left 10/25/2019   Procedure: HAMMER TOE REPAIR SECOND, THIRD, FOUTH;  Surgeon: Edrick Kins, DPM;  Location: [place] [facility];  Service: Podiatry;  Laterality: Left;   HERNIA REPAIR     I &  D EXTREMITY Left 06/27/2015   Procedure: Partial Excision Left Calcaneus, Place Antibiotic Beads, and Wound VAC;  Surgeon: Newt Minion, MD;  Location: [place];  Service: Orthopedics;  Laterality: Left;   JOINT REPLACEMENT     KNEE ARTHROSCOPY     right   LEFT AND RIGHT HEART CATHETERIZATION WITH CORONARY ANGIOGRAM N/A 04/23/2013   Procedure: LEFT AND RIGHT HEART CATHETERIZATION WITH CORONARY ANGIOGRAM;  Surgeon: Leonie Man, MD;  Location: [facility] C[facility] LAB;  Service: Cardiovascular;  Laterality: N/A;   LEFT HEART CATH AND CORONARY ANGIOGRAPHY N/A 03/11/2017   Procedure: Left Heart Cath and Coronary Angiography;  Surgeon: Sherren Mocha, MD;  Location: [place] CV LAB; angiographically minimal CAD in the LAD otherwise normal.;  Normal LVEDP.  FALSE POSITIVE MYOVIEW   LEFT HEART CATH AND CORONARY ANGIOGRAPHY  07/20/2010   LVEF 50-55% WITH VERY MILD GLOBAL HYPOKINESIA; ESSENTIALLY NORMAL CORONARY ARTERIES; NORMAL LV FUNCTION   METATARSAL OSTEOTOMY WITH OPEN REDUCTION INTERNAL FIXATION (ORIF) METATARSAL  WITH FUSION Left 04/09/2014   Procedure: LEFT FOOT FRACTURE OPEN TREATMENT METATARSAL INCLUDES INTERNAL FIXATION EACH;  Surgeon: Lorn Junes, MD;  Location: [place];  Service: Orthopedics;  Laterality: Left;   NISSEN FUNDOPLICATION  3664   NM MYOVIEW LTD --> FALSE POSITIVE  03/10/2017   : Moderate size "stress-induced "perfusion defect at the apex as well as "ill-defined stress-induced perfusion defect in the lateral wall.  EF 55%.  INTERMEDIATE risk. -->  FALSE POSITIVE   ORIF DISTAL FEMUR FRACTURE  08/08/2020   [facility] [place]: ORIF of left extra-articular periprosthetic distal femur fracture with synthesis of the femur plate with cortical nonlocking screws.  (Thought to be pathologic fracture due to osteoporosis) -> after a fall   ORIF FEMUR W/ PERI-IMPLANT  08/29/2020   [facility]) revision of left femur fracture ORIF plate fixation screws; culture positive for MSSA   Right and left CARDIAC CATHETERIZATION  04/23/2013   Angiographic normal coronaries; LVEDP 20 mmHg, PCWP 12-14 mmHg, RAP 12 mmHg.; Fick CO/CI 4.9/2.2   ROTATOR CUFF REPAIR Right 12/09/2021   SHOULDER ARTHROSCOPY Left 03/14/2019   Procedure: LEFT SHOULDER ARTHROSCOPY, DEBRIDEMENT, AND DECOMPRESSION;  Surgeon: Newt Minion, MD;  Location: [place];  Service: Orthopedics;  Laterality: Left;   TOTAL KNEE ARTHROPLASTY Right 06/29/2017   Procedure: RIGHT TOTAL KNEE ARTHROPLASTY;  Surgeon: Newt Minion, MD;  Location: [place];  Service: Orthopedics;  Laterality: Right;   TOTAL KNEE ARTHROPLASTY Left 12/07/2017   TOTAL KNEE ARTHROPLASTY Left 12/07/2017   Procedure: LEFT TOTAL KNEE ARTHROPLASTY;  Surgeon: Newt Minion, MD;  Location: [place];  Service: Orthopedics;  Laterality: Left;   TRACHEOSTOMY TUBE PLACEMENT N/A 09/20/2017   Procedure: AWAKE INTUBATION WITH ANESTHESIA WITH VIDEO ASSISTANCE;  Surgeon: Helayne Seminole, MD;  Location: [place] [facility];  Service: ENT;  Laterality: N/A;   TRANSTHORACIC  ECHOCARDIOGRAM  08/2014   Normal LV size and function.  Mild LVH.  EF 55-60%.  Normal regional wall motion.  GR 1 DD.  Normal RV size and function .   TRANSTHORACIC ECHOCARDIOGRAM  08/12/2020    [facility]): Normal LV size and function.  EF 55 to 60%.  No RWM A.  Normal filling pattern.  Normal RV.  No significant valvular disease-stenosis or regurgitation.  No vegetation.   TUBAL LIGATION     with reversal in [place]  Social History:   reports that she has never smoked. She has never used smokeless tobacco. She reports that she does not drink alcohol and does not use drugs.   Family History:  Her family history includes Asthma in her mother; Breast cancer in her mother; COPD in her mother; Coronary artery disease in her father; Diabetes in her brother, father, and mother; Emphysema in her mother; Heart attack in her maternal grandfather; Heart attack (age of onset: 53) in her father; Heart disease in her father; Heart failure in her father and mother; Hypertension in her father; Kidney disease in her mother; Lung cancer in her brother; Obesity in her father; Sarcoidosis in her maternal uncle; Sleep apnea in her father; Throat cancer in her father; Thyroid disease in her mother. There is no history of Colon cancer, Colon polyps, Esophageal cancer, Rectal cancer, or Stomach cancer.   Allergies Allergies  Allergen Reactions   Methotrexate Other (See Comments)    Peri-oral and buccal lesions   Vancomycin Other (See Comments)    DOSE RELATED NEPHROTOXICITY Other reaction(s): other   Lisinopril Cough    Other reaction(s): other   Chlorhexidine Itching   Clindamycin/Lincomycin Nausea And Vomiting and Rash   Lincomycin Nausea Only and Rash    Other reaction(s): vomiting   Teflaro [Ceftaroline] Rash and Other (See Comments)    Tolerates ceftriaxone      Home Medications  Prior to Admission medications   Medication  Sig Start Date End Date Taking? Authorizing Provider  albuterol (PROVENTIL HFA;VENTOLIN HFA) 108 (90 Base) MCG/ACT inhaler Inhale 1-2 puffs into the lungs every 6 (six) hours as needed for wheezing or shortness of breath. 01/04/19  Yes [name], MD  albuterol (PROVENTIL) (2.5 MG/3ML) 0.083% nebulizer solution Take 2.5 mg by nebulization every 6 (six) hours as needed for wheezing or shortness of breath.   Yes [provider]  allopurinol (ZYLOPRIM) 300 MG tablet TAKE 1 TABLET BY MOUTH EVERY DAY 12/17/21  Yes Aundra Dubin, PA-C  aspirin EC 81 MG tablet Take 1 tablet (81 mg total) by mouth daily. 11/03/20  Yes Leonie Man, MD  atorvastatin (LIPITOR) 40 MG tablet TAKE 1 TABLET BY MOUTH EVERY DAY 08/31/21  Yes [name], MD  benzonatate (TESSALON PERLES) 100 MG capsule Take 2 capsules (200 mg total) by mouth 3 (three) times daily as needed for cough. 05/25/22  Yes Hudnell, Stephanie, NP  BREO ELLIPTA 200-25 MCG/INH AEPB TAKE 1 PUFF BY MOUTH EVERY DAY Patient taking differently: Inhale 1 puff into the lungs daily. 06/04/19  Yes [name], MD  buPROPion (WELLBUTRIN XL) 300 MG 24 hr tablet TAKE 1 TABLET BY MOUTH EVERY DAY 01/04/22  Yes [name], MD  carvedilol (COREG) 25 MG tablet Take 1 tablet by mouth daily.   Yes [provider]  Cholecalciferol 25 MCG (1000 UT) capsule Take 1,000 Units by mouth in the morning and at bedtime. 10/26/11  Yes [provider]  diclofenac Sodium (VOLTAREN) 1 % GEL Apply 1 application topically as needed (pain.).    Yes [provider]  diltiazem (CARDIZEM CD) 120 MG 24 hr capsule TAKE 1 CAPSULE BY MOUTH EVERY DAY 01/28/22  Yes Leonie Man, MD  Dulaglutide (TRULICITY) 4.5 KT/6.2BW SOPN Inject 4.5 mg as directed once a week. 02/02/22  Yes Renato Shin, MD  famotidine (PEPCID) 20 MG tablet Take 1 tablet (20 mg total) by mouth at bedtime. 12/13/18  Yes Willia Craze, NP  fenofibrate 160 MG  tablet TAKE 1  TABLET BY MOUTH EVERY DAY 01/04/22  Yes Leonie Man, MD  fluticasone [facility]) 50 MCG/ACT nasal spray Place 2 sprays into both nostrils daily as needed for allergies. 01/22/20  Yes [name], MD  furosemide (LASIX) 40 MG tablet TAKE 1 TABLET BY MOUTH TWICE A DAY Patient taking differently: Take 40 mg by mouth daily. 12/17/21  Yes [name], MD  gabapentin (NEURONTIN) 100 MG capsule TAKE 1 CAPSULE BY MOUTH THREE TIMES A DAY Patient taking differently: Take 100 mg by mouth 3 (three) times daily. TAKE 1 CAPSULE BY MOUTH THREE TIMES A DAY 02/02/22  Yes [name], MD  HYDROcodone-acetaminophen (NORCO/VICODIN) 5-325 MG tablet Take 1 tablet by mouth every 6 (six) hours as needed. 03/29/22  Yes Gregor Hams, MD  insulin aspart (NOVOLOG FLEXPEN) 100 UNIT/ML FlexPen Inject 15 Units into the skin daily with supper. 11/06/21  Yes Renato Shin, MD  insulin glargine (LANTUS SOLOSTAR) 100 UNIT/ML Solostar Pen Inject 16 Units into the skin every morning. Patient taking differently: Inject 18 Units into the skin every morning. 07/30/21  Yes Renato Shin, MD  KLOR-CON M20 20 MEQ tablet TAKE 1 & 1/2 TABLETS BY MOUTH TWICE A DAY Patient taking differently: Take 30 mEq by mouth 2 (two) times daily. 01/27/22  Yes [name], MD  LINZESS 290 MCG CAPS capsule TAKE 1 CAPSULE BY MOUTH DAILY BEFORE BREAKFAST. Patient taking differently: Take 290 mcg by mouth daily as needed (IBS symptoms). 09/07/21  Yes Irene Shipper, MD  meloxicam (MOBIC) 15 MG tablet Take 1 tablet (15 mg total) by mouth daily. Patient taking differently: Take 15 mg by mouth daily as needed for pain. 06/24/21  Yes Marybelle Killings, MD  metFORMIN (GLUCOPHAGE-XR) 500 MG 24 hr tablet Take 4 tablets (2,000 mg total) by mouth daily with breakfast. 05/05/22  Yes Elayne Snare, MD  ondansetron (ZOFRAN-ODT) 4 MG disintegrating tablet Take 1 tablet (4 mg total) by mouth every 8 (eight) hours as needed for nausea or vomiting. 11/17/21  Yes  Renato Shin, MD  pantoprazole (PROTONIX) 40 MG tablet Take 1 tablet (40 mg total) by mouth daily. 04/29/22  Yes [name], MD  telmisartan (MICARDIS) 20 MG tablet TAKE 1 TABLET BY MOUTH EVERY DAY 01/04/22  Yes [name], MD  venlafaxine XR (EFFEXOR-XR) 75 MG 24 hr capsule TAKE 1 CAPSULE BY MOUTH TWICE A DAY 01/28/22  Yes [name], MD  amoxicillin-clavulanate (AUGMENTIN) 875-125 MG tablet Take 1 tablet by mouth 2 (two) times daily. Patient not taking: Reported on 06/06/2022 04/28/22   Tawnya Crook, MD  Continuous Blood Gluc Sensor (FREESTYLE LIBRE 3 SENSOR) MISC 1 Device by Does not apply route every 14 (fourteen) days. Apply 1 sensor on upper arm every 14 days for continuous glucose monitoring 05/03/22   Elayne Snare, MD  gabapentin (NEURONTIN) 300 MG capsule Take at night in addition to the '100mg'$  gabapentin to get '400mg'$  at night and '100mg'$  in the morning and at mid day. 03/29/22   Gregor Hams, MD  glucose blood (CONTOUR NEXT TEST) test strip 1 each by Other route 4 (four) times daily. And lancets 4/day 09/19/19   Renato Shin, MD  Insulin Pen Needle (PEN NEEDLES) 32G X 4 MM MISC 1 each by Does not apply route 4 (four) times daily. E11.9 02/22/20   Renato Shin, MD  ipratropium (ATROVENT) 0.03 % nasal spray PLACE 2 SPRAYS INTO BOTH NOSTRILS EVERY 12 (TWELVE) HOURS. Patient not  taking: Reported on 06/06/2022 03/11/21   [name], MD  lactulose (CHRONULAC) 10 GM/15ML solution TAKE 15 MLS BY MOUTH DAILY AS NEEDED FOR CONSTIPATION. Patient not taking: Reported on 06/06/2022 10/07/20   [name], MD  Lancet Devices (EASY MINI EJECT LANCING DEVICE) MISC Use as directed 4 times daily to test blood sugar 11/04/20   Renato Shin, MD  metoCLOPramide (REGLAN) 10 MG tablet Take 1 tablet (10 mg total) by mouth every 6 (six) hours. Patient not taking: Reported on 5/64/3329 02/09/87   Delora Fuel, MD  Microlet Lancets MISC 1 each by Does not apply route 2 (two) times daily. E11.9  09/19/19   Renato Shin, MD  nitroGLYCERIN (NITROSTAT) 0.4 MG SL tablet Place 1 tablet (0.4 mg total) under the tongue every 5 (five) minutes as needed for chest pain. Reported on 01/05/2016 01/10/18   Leonie Man, MD  phenazopyridine (PYRIDIUM) 100 MG tablet Take 1 tablet (100 mg total) by mouth 3 (three) times daily as needed for pain. Patient not taking: Reported on 06/06/2022 01/13/22   Tawnya Crook, MD  mupirocin nasal ointment (BACTROBAN) 2 % Place 1 application into the nose 2 (two) times daily. Use one-half of tube in each nostril twice daily for five (5) days. After application, press sides of nose together and gently massage. 03/28/18 03/28/18  [name], MD    Romana Juniper, MD 06/07/22

## 2022-06-07 NOTE — Progress Notes (Signed)
Attending:  Seen and examined with the resident Dr. Simeon Craft, discussed assessment and plan with her    Subjective: Feels OK, but still has cough and wheezing Noted more dyspnea in last few weeks Says that she has had more heartburn in the last few months  Objective: Vitals:   06/07/22 0500 06/07/22 0600 06/07/22 0720 06/07/22 0827  BP:   (!) 146/74   Pulse: 68 69 73   Resp: '16 16 19 16  '$ Temp:   97.9 F (36.6 C)   TempSrc:   Oral   SpO2: 95% 95% 92%   Weight:      Height:          Intake/Output Summary (Last 24 hours) at 06/07/2022 1050 Last data filed at 06/07/2022 0300 Gross per 24 hour  Intake 1370 ml  Output --  Net 1370 ml    General:  Resting comfortably in bed HENT: NCAT OP clear PULM: CTA B, normal effort, occasional exhalation only wheeze that comes and goes on exam CV: RRR, no mgr GI: BS+, soft, nontender MSK: normal bulk and tone Neuro: awake, alert, no distress, MAEW    CBC    Component Value Date/Time   WBC 12.6 (H) 06/07/2022 0104   RBC 3.38 (L) 06/07/2022 0104   HGB 10.5 (L) 06/07/2022 0104   HGB 12.1 04/17/2014 1039   HCT 31.0 (L) 06/07/2022 0104   HCT 36.9 04/17/2014 1039   PLT 349 06/07/2022 0104   PLT 436 (H) 04/17/2014 1039   MCV 91.7 06/07/2022 0104   MCV 87.9 04/17/2014 1039   MCH 31.1 06/07/2022 0104   MCHC 33.9 06/07/2022 0104   RDW 13.1 06/07/2022 0104   RDW 14.5 04/17/2014 1039   LYMPHSABS 2.1 02/12/2022 1018   LYMPHSABS 2.2 04/17/2014 1039   MONOABS 0.6 02/12/2022 1018   MONOABS 0.8 04/17/2014 1039   EOSABS 0.4 02/12/2022 1018   EOSABS 0.1 04/17/2014 1039   BASOSABS 0.1 02/12/2022 1018   BASOSABS 0.0 04/17/2014 1039    BMET    Component Value Date/Time   NA 136 06/07/2022 0104   NA 139 10/20/2020 0000   NA 135 (L) 04/17/2014 1039   K 4.2 06/07/2022 0104   K 4.5 04/17/2014 1039   CL 100 06/07/2022 0104   CO2 25 06/07/2022 0104   CO2 26 04/17/2014 1039   GLUCOSE 329 (H) 06/07/2022 0104   GLUCOSE 300 (H)  04/17/2014 1039   GLUCOSE 150 (H) 09/14/2006 1033   BUN 30 (H) 06/07/2022 0104   BUN 10 10/20/2020 0000   BUN 13.9 04/17/2014 1039   CREATININE 1.40 (H) 06/07/2022 0104   CREATININE 0.88 08/21/2020 1404   CREATININE 0.8 04/17/2014 1039   CALCIUM 9.1 06/07/2022 0104   CALCIUM 10.0 04/17/2014 1039   GFRNONAA 43 (L) 06/07/2022 0104   GFRNONAA 71 08/21/2020 1404   GFRAA 96 10/20/2020 0000   GFRAA 82 08/21/2020 1404    CXR images reviewed CT angiogram chest images personally reviewed> no PE, no mediastinal adenopathy, some airway thickening noted, no pulmonary parenchymal problems  Impression: Acute on chronic bronchitis Vocal cord dysfunction related cough GERD> picture worrisome for failure of Nissen Fundoplication Possible pulmonary hypertension Dyspnea  Discussion: Given her history I think it makes the most sense to treat her with a short course of steroids (prednisone '20mg'$  daily x 7 days) and SABA.  Would start PPI for GERD and encourage voice rest and cough suppression for vocal cord dysfunction.  Need to determine if Nissen has failed given  recent heartburn.  Need to work up pulmonary hypertension with echo and sleep study.  Plan: Prednisone '20mg'$  daily x7 days Delsym for cough SABA prn Start PPI Echo Discuss CT chest with radiology> Nissen still intact? Needs to re-establish pulmonary with Dr. Vaughan Browner needs outpatient PFT, sleep study  Roselie Awkward, MD [place] [facility] Pager: [phone] Cell: [phone] After 7:00 pm call Elink  [phone]   My cc time n/a minutes  Roselie Awkward, MD [place] [facility] Pager: [phone] Cell: [phone] After 7pm: [phone]

## 2022-06-07 NOTE — Progress Notes (Signed)
Progress Note   Patient: [name] ZOX:[id number] DOB: 1961/07/15 DOA: 06/05/2022     1 DOS: the patient was seen and examined on 06/07/2022   Brief hospital course: Mrs. Renaldo was admitted to the hospital with the working diagnosis of dyspnea.   61 yo female with the past medical history of hypertension, chronic bronchitis, sarcoidosis, diastolic heart failure, GERD and T2DM who presented with dyspnea. Reported 3 weeks of non productive cough, that was treated with azithromycin with no improvement in her symptoms. Over last few days her dyspnea was associated with dyspnea and wheezing prompting her to come to the ED. On her initial physical examination her blood pressure was 159/79, HR 87, RR 27 and 02 saturation 97%, lungs with prolonged expiratory phase, upper airway wheezing (expiratory), heart with S1 and S2 present and rhythmic with no gallops, abdomen with no distention, no lower extremity edema.   NA 137, K 4,3 Cl 101, bicarbonate 24, glucose 191, bun 22 cr 1,21 High sensitive troponin 5 and 5  Wbc 13,5 hgb 10,7 plt 372  Sars covid 19 negative   Chest radiograph with no infiltrates. CT chest with no pulmonary embolism, with no infiltrates.  CT neck soft tissue no acute changes, narrowing of the laryngeal airway is favored to be incidental possible phonation.   EKG 93 bpm, normal axis, normal intervals, sinus rhythm, small q led II, III, AvF, lead V5 and V6 with no significant ST segment or T wave changes.   Patient was evaluated by ENT, fiberoptic examination was normal. Normal vocal cord motion and open airway.   Patient has been placed on bronchodilator therapy and systemic steroids with slow improvement in her symptoms.  Her husband has tested positive for COVID 19.   Assessment and Plan: COPD with acute exacerbation ([place]) Patient with no oxygen desaturation, respiratory failure ruled out. Continue with prolonged expiratory phase and expiratory wheezing  Continue  medical therapy with bronchodilator therapy and systemic corticosteroids Continue with inhaled corticosteroids Antitussive therapy and as needed lorazepam Continue oxymetry monitoring.  Her husband had tested positive for COVID 19, will have patient re tested and if positive will add droplet precautions.   Chronic diastolic CHF (congestive heart failure) (HCC) No clinical signs of decompensated heart failure Plan to hold on diuretic therapy for now.  Continue blood pressure monitoring.   CKD (chronic kidney disease) stage 3, GFR 30-59 ml/min (HCC) Renal function with serum cr at 1,40 with K at 4,2 and serum bicarbonate at 25. Plan to continue close monitoring of renal function and continue supportive medical therapy. Avoid hypotension and nephrotoxic medications.   Hypertension associated with diabetes ([place]) Continue blood pressure monitoring.  On diltiazem, carvedilol and irbersartan.   Continue to hold on diuretic therapy.   Type II diabetes mellitus with renal manifestations (HCC) Hyperglycemia  Continue insulin sliding scale for glucose cover and monitoring Increase basal insulin to 20 units, and continue close monitoring.  If patient can tolerate po well will add pre meal insulin.   Diabetic neuropathy, continue with gabapentin.   Major depression in full remission ([place]) Plan to continue with venlafaxine and bupropion Continue with as needed lorazepam.   Class 2 obesity Calculated BMI 35.8         Subjective: Patient with mild improvement in her dyspnea but not back to baseline, no chest pain, no nausea or vomiting.   Physical Exam: Vitals:   06/07/22 0720 06/07/22 0827 06/07/22 1134 06/07/22 1550  BP: (!) 146/74  (!) 155/69 (!) 128/56  Pulse: 73  65 63  Resp: '19 16 11 18  '$ Temp: 97.9 F (36.6 C)  98 F (36.7 C) 97.9 F (36.6 C)  TempSrc: Oral  Oral Oral  SpO2: 92%  97% 94%  Weight:      Height:       Neurology awake and alert ENT with mild  pallor Cardiovascular with S1 and S2 present and rhythmic with no gallops, rubs or murmurs Respiratory with expiratory wheezing and prolonged expiratory phase, no rhonchi or rales.  Abdomen with no distention  No lower extremity edema  Data Reviewed:    Family Communication: no family at the bedside   Disposition: Status is: Inpatient Remains inpatient appropriate because: COPD exacerbation with impending respiratory failure   Planned Discharge Destination: Home      Author: Tawni Millers, MD 06/07/2022 5:54 PM  For on call review [email or web address].

## 2022-06-07 NOTE — Telephone Encounter (Signed)
Pt scheduled on 9/21 at 11:30 with PM. This will be printed on discharge papers, and reminder has been mailed.

## 2022-06-07 NOTE — Progress Notes (Signed)
Inpatient Diabetes Program Recommendations  AACE/ADA: New Consensus Statement on Inpatient Glycemic Control (2015)  Target Ranges:  Prepandial:   less than 140 mg/dL      Peak postprandial:   less than 180 mg/dL (1-2 hours)      Critically ill patients:  140 - 180 mg/dL   Lab Results  Component Value Date   GLUCAP 156 (H) 06/07/2022   HGBA1C 8.4 (H) 04/29/2022    Review of Glycemic Control  Latest Reference Range & Units 06/06/22 23:10 06/07/22 03:36 06/07/22 07:18  Glucose-Capillary 70 - 99 mg/dL 351 (H) 285 (H) 156 (H)   Diabetes history: DM 2 Outpatient Diabetes medications:  Trulicity 4.5 mg weekly Novolog 15 units with supper Lantus 18 units daily Metformin 2000 mg with breakfast Current orders for Inpatient glycemic control:  Solumedrol 80 mg IV daily Semglee 10 units daily Novolog sensitive q 4 hours  Inpatient Diabetes Program Recommendations:    May consider increasing Semglee to 20 units daily.  Also consider adding Novolog meal coverage 6 units tid with meals (hold if patient NPO or eats less than 50%).   Thanks,  Adah Perl, RN, BC-ADM Inpatient Diabetes Coordinator Pager [phone]  (8a-5p)

## 2022-06-07 NOTE — Telephone Encounter (Signed)
Please make hospital follow up visit in 3-4 weeks with Dr. Vaughan Browner or an APP.  Sarcoid, vocal cord dysfunction, bronchitis, pulmonary hypertension.

## 2022-06-08 ENCOUNTER — Inpatient Hospital Stay (HOSPITAL_COMMUNITY): Payer: BC Managed Care – PPO

## 2022-06-08 DIAGNOSIS — E1122 Type 2 diabetes mellitus with diabetic chronic kidney disease: Secondary | ICD-10-CM | POA: Diagnosis not present

## 2022-06-08 DIAGNOSIS — N1831 Chronic kidney disease, stage 3a: Secondary | ICD-10-CM | POA: Diagnosis not present

## 2022-06-08 DIAGNOSIS — I5032 Chronic diastolic (congestive) heart failure: Secondary | ICD-10-CM | POA: Diagnosis not present

## 2022-06-08 DIAGNOSIS — I272 Pulmonary hypertension, unspecified: Secondary | ICD-10-CM

## 2022-06-08 DIAGNOSIS — J441 Chronic obstructive pulmonary disease with (acute) exacerbation: Secondary | ICD-10-CM | POA: Diagnosis not present

## 2022-06-08 LAB — GLUCOSE, CAPILLARY
Glucose-Capillary: 255 mg/dL — ABNORMAL HIGH (ref 70–99)
Glucose-Capillary: 235 mg/dL — ABNORMAL HIGH (ref 70–99)
Glucose-Capillary: 218 mg/dL — ABNORMAL HIGH (ref 70–99)
Glucose-Capillary: 310 mg/dL — ABNORMAL HIGH (ref 70–99)
Glucose-Capillary: 441 mg/dL — ABNORMAL HIGH (ref 70–99)

## 2022-06-08 LAB — ECHOCARDIOGRAM COMPLETE
AR max vel: 2.78 cm2
AV Area VTI: 3.37 cm2
AV Area mean vel: 2.81 cm2
AV Mean grad: 4 mmHg
AV Peak grad: 7 mmHg
Ao pk vel: 1.32 m/s
Area-P 1/2: 3.21 cm2
Height: 66 in
S' Lateral: 3.7 cm
Weight: 3516.78 oz

## 2022-06-08 LAB — BASIC METABOLIC PANEL
Anion gap: 7 (ref 5–15)
BUN: 27 mg/dL — ABNORMAL HIGH (ref 8–23)
CO2: 28 mmol/L (ref 22–32)
Calcium: 9.1 mg/dL (ref 8.9–10.3)
Chloride: 102 mmol/L (ref 98–111)
Creatinine, Ser: 1.11 mg/dL — ABNORMAL HIGH (ref 0.44–1.00)
GFR, Estimated: 56 mL/min — ABNORMAL LOW (ref >60)
Glucose, Bld: 302 mg/dL — ABNORMAL HIGH (ref 70–99)
Potassium: 4 mmol/L (ref 3.5–5.1)
Sodium: 137 mmol/L (ref 135–145)

## 2022-06-08 LAB — SARS CORONAVIRUS 2 (TAT 6-24 HRS): SARS Coronavirus 2: NEGATIVE

## 2022-06-08 MED ORDER — ENOXAPARIN SODIUM 40 MG/0.4ML IJ SOSY
40.0000 mg | PREFILLED_SYRINGE | Freq: Every day | INTRAMUSCULAR | Status: DC
  Administered 2022-06-08 – 2022-06-09 (×2): 40 mg via SUBCUTANEOUS
  Filled 2022-06-08 (×2): qty 0.4

## 2022-06-08 MED ORDER — INSULIN ASPART 100 UNIT/ML IJ SOLN
0.0000 [IU] | Freq: Three times a day (TID) | INTRAMUSCULAR | Status: DC
  Administered 2022-06-08: 11 [IU] via SUBCUTANEOUS

## 2022-06-08 MED ORDER — IPRATROPIUM-ALBUTEROL 0.5-2.5 (3) MG/3ML IN SOLN
3.0000 mL | Freq: Two times a day (BID) | RESPIRATORY_TRACT | Status: DC
  Administered 2022-06-09: 3 mL via RESPIRATORY_TRACT
  Filled 2022-06-08 (×2): qty 3

## 2022-06-08 MED ORDER — INSULIN ASPART 100 UNIT/ML IJ SOLN
3.0000 [IU] | Freq: Three times a day (TID) | INTRAMUSCULAR | Status: DC
  Administered 2022-06-08 – 2022-06-09 (×3): 3 [IU] via SUBCUTANEOUS

## 2022-06-08 MED ORDER — PREDNISONE 20 MG PO TABS
40.0000 mg | ORAL_TABLET | Freq: Every day | ORAL | Status: DC
Start: 2022-06-08 — End: 2022-06-09
  Administered 2022-06-08 – 2022-06-09 (×2): 40 mg via ORAL
  Filled 2022-06-08 (×2): qty 2

## 2022-06-08 MED ORDER — INSULIN ASPART 100 UNIT/ML IJ SOLN
0.0000 [IU] | Freq: Three times a day (TID) | INTRAMUSCULAR | Status: DC
  Administered 2022-06-09: 11 [IU] via SUBCUTANEOUS
  Administered 2022-06-09: 2 [IU] via SUBCUTANEOUS

## 2022-06-08 MED ORDER — INSULIN ASPART 100 UNIT/ML IJ SOLN
12.0000 [IU] | Freq: Once | INTRAMUSCULAR | Status: AC
  Administered 2022-06-08: 12 [IU] via SUBCUTANEOUS

## 2022-06-08 MED ORDER — IPRATROPIUM-ALBUTEROL 0.5-2.5 (3) MG/3ML IN SOLN
3.0000 mL | Freq: Four times a day (QID) | RESPIRATORY_TRACT | Status: DC
Start: 2022-06-08 — End: 2022-06-08

## 2022-06-08 NOTE — Progress Notes (Signed)
Progress Note   Patient: [name] OVF:[id number] DOB: 12/13/1960 DOA: 06/05/2022     2 DOS: the patient was seen and examined on 06/08/2022   Brief hospital course: [name] was admitted to the hospital with the working diagnosis of COPD exacerbation, likely triggered by COVID 19 infection.   61 yo female with the past medical history of hypertension, chronic bronchitis, sarcoidosis, diastolic heart failure, GERD and T2DM who presented with dyspnea. Reported 3 weeks of non productive cough, that was treated with azithromycin with no improvement in her symptoms. Over last few days her she had progression in the severity of her symptoms, and wheezing prompting her to come to the ED. On her initial physical examination her blood pressure was 159/79, HR 87, RR 27 and 02 saturation 97%, lungs with prolonged expiratory phase, upper airway wheezing (expiratory), heart with S1 and S2 present and rhythmic with no gallops, abdomen with no distention, no lower extremity edema.   NA 137, K 4,3 Cl 101, bicarbonate 24, glucose 191, bun 22 cr 1,21 High sensitive troponin 5 and 5  Wbc 13,5 hgb 10,7 plt 372  Sars covid 19 negative   Chest radiograph with no infiltrates. CT chest with no pulmonary embolism, with no infiltrates.  CT neck soft tissue no acute changes, narrowing of the laryngeal airway is favored to be incidental possible phonation.   EKG 93 bpm, normal axis, normal intervals, sinus rhythm, small q led II, III, AvF, lead V5 and V6 with no significant ST segment or T wave changes.   Patient was evaluated by ENT, fiberoptic examination was normal. Normal vocal cord motion and open airway.   Patient has been placed on bronchodilator therapy and systemic steroids with slow improvement in her symptoms.  08/28 Her husband has tested positive for COVID 19.  08/29 her symptoms are improving but not back to baseline.  Out of bed to chair tid and increase mobility, Second test for COVID 19  continue to be negative.   Assessment and Plan: COPD with acute exacerbation ([place]) Her symptoms are slowly improving but not back to baseline yet. She has tested negative for COVID 19 for the second time, I suspect she did have COVID a few weeks ago. Her husband has tested positive on 08/28 for COVID 19.   Plan to continue with bronchodilator therapy Systemic and inhaled corticosteroids Change from methylprednisolone to prednisone.  Continue with antitussive agents and airway clearing techniques. Out of bed to chair tid with meals, pt and ot.   Chronic diastolic CHF (congestive heart failure) (HCC) Echocardiogram with preserved LV systolic function with EF 55 to 60% with moderate dilatation of internal cavity, RV systolic function is preserved. Small pericardial effusion, no significant valvular disease.   Currently with no clinical signs of decompensated heart failure Plan to hold on diuretic therapy for now.  Continue blood pressure monitoring.   CKD (chronic kidney disease) stage 3, GFR 30-59 ml/min (HCC) Her her renal function is improving, with serum cr at 1,1 with K at 4,0 and serum bicarbonate at 28. Plan to continue to hold on diuretic therapy for now.   Hypertension associated with diabetes ([place]) Systolic blood pressure 841 to 150 mmHg.  Continue with diltiazem, carvedilol and irbersartan.   Continue to hold on diuretic therapy.   Type II diabetes mellitus with renal manifestations (HCC) Hyperglycemia  Capillary glucose 374, 255, 235 and 218.  Continue insulin sliding scale for glucose cover and monitoring. Increase intensity of sliding scale.  Decrease dose  steroids, for now will keep basal insulin to 20 units and will add pre meal coverage. .   Diabetic neuropathy, continue with gabapentin.   Major depression in full remission ([place]) Plan to continue with venlafaxine and bupropion Continue with as needed lorazepam.   Class 2 obesity Calculated BMI 35.8          Subjective: patient with improvement in dyspnea, not yet back to baseline, no chest pain, no nausea or vomiting   Physical Exam: Vitals:   06/07/22 2321 06/08/22 0405 06/08/22 0755 06/08/22 1201  BP: 137/69 (!) 151/69 (!) 151/64 134/63  Pulse: 76 67 68 69  Resp: '16 18 17 12  '$ Temp: 98.3 F (36.8 C) 98 F (36.7 C) 98 F (36.7 C) 98.6 F (37 C)  TempSrc: Oral Oral Oral Oral  SpO2: 97% 99% 96% 96%  Weight:      Height:       Neurology awake and alert ENT with no pallor Cardiovascular with S1 and S2 present and rhythmic with no gallops Respiratory with prolonged expiratory phase and scattered expiratory wheezing, bilateral rhonchi Abdomen with no distention  No lower extremity edema  Data Reviewed:    Family Communication: no family at the  bedside   Disposition: Status is: Inpatient Remains inpatient appropriate because: COPD exacerbation   Planned Discharge Destination: Home     Author: Tawni Millers, MD 06/08/2022 12:15 PM  For on call review [email or web address].

## 2022-06-08 NOTE — Progress Notes (Signed)
Inpatient Diabetes Program Recommendations  AACE/ADA: New Consensus Statement on Inpatient Glycemic Control (2015)  Target Ranges:  Prepandial:   less than 140 mg/dL      Peak postprandial:   less than 180 mg/dL (1-2 hours)      Critically ill patients:  140 - 180 mg/dL   Lab Results  Component Value Date   GLUCAP 235 (H) 06/08/2022   HGBA1C 8.4 (H) 04/29/2022    Review of Glycemic Control  Latest Reference Range & Units 06/07/22 19:54 06/07/22 23:20 06/08/22 04:04 06/08/22 07:47  Glucose-Capillary 70 - 99 mg/dL 340 (H) 374 (H) 255 (H) 235 (H)  Diabetes history: DM 2 Outpatient Diabetes medications:  Trulicity 4.5 mg weekly Novolog 15 units with supper Lantus 18 units daily Metformin 2000 mg with breakfast Current orders for Inpatient glycemic control:  Solumedrol 80 mg IV daily Semglee 20 units daily Novolog sensitive q 4 hours  Inpatient Diabetes Program Recommendations:    Consider increasing Semglee to 30 units daily while on IV steroids.   Thanks  Adah Perl, RN, BC-ADM Inpatient Diabetes Coordinator Pager [phone]  (8a-5p)

## 2022-06-08 NOTE — Progress Notes (Signed)
Attending:  Seen and examined with the resident Dr. Simeon Craft, discussed assessment and plan with her    Subjective: Cough, dyspnea improved  Objective: Vitals:   06/07/22 2321 06/08/22 0405 06/08/22 0755 06/08/22 1201  BP: 137/69 (!) 151/69 (!) 151/64 134/63  Pulse: 76 67 68 69  Resp: '16 18 17 12  '$ Temp: 98.3 F (36.8 C) 98 F (36.7 C) 98 F (36.7 C) 98.6 F (37 C)  TempSrc: Oral Oral Oral Oral  SpO2: 97% 99% 96% 96%  Weight:      Height:          Intake/Output Summary (Last 24 hours) at 06/08/2022 1253 Last data filed at 06/08/2022 0905 Gross per 24 hour  Intake 420 ml  Output --  Net 420 ml    General:  Resting comfortably in bed HENT: NCAT OP clear PULM: CTA B, normal effort CV: RRR, no mgr GI: BS+, soft, nontender MSK: normal bulk and tone Neuro: awake, alert, no distress, MAEW    CBC    Component Value Date/Time   WBC 12.6 (H) 06/07/2022 0104   RBC 3.38 (L) 06/07/2022 0104   HGB 10.5 (L) 06/07/2022 0104   HGB 12.1 04/17/2014 1039   HCT 31.0 (L) 06/07/2022 0104   HCT 36.9 04/17/2014 1039   PLT 349 06/07/2022 0104   PLT 436 (H) 04/17/2014 1039   MCV 91.7 06/07/2022 0104   MCV 87.9 04/17/2014 1039   MCH 31.1 06/07/2022 0104   MCHC 33.9 06/07/2022 0104   RDW 13.1 06/07/2022 0104   RDW 14.5 04/17/2014 1039   LYMPHSABS 2.1 02/12/2022 1018   LYMPHSABS 2.2 04/17/2014 1039   MONOABS 0.6 02/12/2022 1018   MONOABS 0.8 04/17/2014 1039   EOSABS 0.4 02/12/2022 1018   EOSABS 0.1 04/17/2014 1039   BASOSABS 0.1 02/12/2022 1018   BASOSABS 0.0 04/17/2014 1039    BMET    Component Value Date/Time   NA 137 06/08/2022 0918   NA 139 10/20/2020 0000   NA 135 (L) 04/17/2014 1039   K 4.0 06/08/2022 0918   K 4.5 04/17/2014 1039   CL 102 06/08/2022 0918   CO2 28 06/08/2022 0918   CO2 26 04/17/2014 1039   GLUCOSE 302 (H) 06/08/2022 0918   GLUCOSE 300 (H) 04/17/2014 1039   GLUCOSE 150 (H) 09/14/2006 1033   BUN 27 (H) 06/08/2022 0918   BUN 10 10/20/2020  0000   BUN 13.9 04/17/2014 1039   CREATININE 1.11 (H) 06/08/2022 0918   CREATININE 0.88 08/21/2020 1404   CREATININE 0.8 04/17/2014 1039   CALCIUM 9.1 06/08/2022 0918   CALCIUM 10.0 04/17/2014 1039   GFRNONAA 56 (L) 06/08/2022 0918   GFRNONAA 71 08/21/2020 1404   GFRAA 96 10/20/2020 0000   GFRAA 82 08/21/2020 1404    CXR images reviewed CT angiogram chest images personally reviewed> no PE, no mediastinal adenopathy, some airway thickening noted, no pulmonary parenchymal problems Echo> LVEF 55-60%, RV size/function is OK, small pericardial effusion  Impression: Acute on chronic bronchitis > improved Vocal cord dysfunction related cough GERD> picture worrisome for failure of Nissen Fundoplication Possible pulmonary hypertension Dyspnea Sarcoidosis  Discussion: Improved today. Still suspicious her Nissen has failed which need outpatient work up  Plan: Prednisone '20mg'$  daily x 7 days Delsym for cough SABA prn PPI Needs outpatient upper GI series to assess Nissen F/u with Dr. Vaughan Browner as outpatient, needs repeat PFT, sleep study  Roselie Awkward, MD [place] [facility] Pager: [phone] Cell: [phone] After 7:00 pm call Elink  [phone]

## 2022-06-08 NOTE — Progress Notes (Signed)
NAME:  [name], MRN:  [id number], DOB:  03-30-1961, LOS: 2 ADMISSION DATE:  06/05/2022, CONSULTATION DATE:  06/06/22 REFERRING MD:  Betsey Holiday, MD CHIEF COMPLAINT:  SOB   History of Present Illness:  61 YO woman with hx of CHF, HTN, HLD, COPD, Pulmonary sarcoidosis, vocal cord spasm, epiglottitis in 2020,  presenting with progressive, worsening cough for 3 weeks, shob, and wheezing.   Patient reports that had similar cough episodes in the past that resolve with ativan, decreased talking, or slow breathing but that have not improved in the past week.    atient with a 20 year history of sarcoidosis on prednisone until 5 years ago. She used to be followed at [facility], where she was last seen 2 years go. Patient mentioned that she has been getting progressively, though, intermittently short of breath in the past 6 months.    [name] has also noticed increased reflux over the last few months that worsen her cough. She has a history of Nissen procedure and stopped taking PPIs since, but that recently she's been using TUMs more often.  She is unable to vomit, but does full abdominal fullness and "gaging" sensation when eating too much.   Patient also notes that she snores, falls asleep often while talking with family and friends. Family was in the room and mentioned has always seemed like she has stopped breathing at times when sleeping.  She denies a history of sleep apnea or cpap use in the past.   Pertinent  Medical History  Anxiety/dep CHF, diastolic COPD  Dm II GERD GOUT HTN HLD Sarcoidosis, pulmonary VCD   Med: albuterol, allopurinol, augmentin (04/28/22?), asa, tessalon, breo/ellipta, wellbutrin, coreg, vit D , dilt, trulicity, fenofibrate, fluticasone, lasix, gabapentin, norco prn, atrovent, lactulose. Ativan, meloxicam, metrormin, reglan, ntg, nystatin swish and gargle, spit, percocet, protonix, micardis, pyridium, effexor,  Significant Hospital Events: Including  procedures, antibiotic start and stop dates in addition to other pertinent events   06/05/2022 CXR: No focal opacities, effusiona, or bony abnormalities 06/05/2022 CT Angio chest PE: No PE. Main pulmonary artery enlargement. No hilar, mediastinal or axillary lymph node. No pleural effusion or pneumothorax.  One surgical clip in the proximal stomach.  06/05/2022 CT soft tissue neck with contrast: no acute abnormality of the neck. Normal pharyx and epiglottis. Narrowing of the laryngeal airway likely in setting of phonation. Non edematous vocal cords 06/06/2022: ENT consulted: normal vocal cord motion per fiberoptic evaluation. No tracheal narrowing.  06/07/22: TTE: LV EF  55-60%, with normal function and no regional motion abnormalities. RV systolic function and size normal. Small pericardial effusion noted. Normal RA pressure. 06/08/22 3rd day of solumedrol therapy. Negative COVID swab Interim History / Subjective:   Patient overall feeling better. Coughing is more controlled and less frequent than we she got to hospital. No shortness of breath when walking to the bathroom. Denies wheezing, chest pain, sputum production today.  Objective   Blood pressure (!) 151/64, pulse 68, temperature 98 F (36.7 C), temperature source Oral, resp. rate 17, height '5\' 6"'$  (1.676 m), weight 99.7 kg, SpO2 96 %.        Intake/Output Summary (Last 24 hours) at 06/08/2022 1053 Last data filed at 06/08/2022 0905 Gross per 24 hour  Intake 420 ml  Output --  Net 420 ml   Filed Weights   06/05/22 1832 06/06/22 1509  Weight: 100.8 kg 99.7 kg    Examination: General: Pleasant, well-appearing sitting in bed. No acute distress. HEENT: moist mucus  membranes. CV: RRR. No murmurs, rubs, or gallops. No LE edema Pulmonary: Lungs CTAB. Normal  respiratory effort. No wheezing noted today. Abdominal: Soft, nontender, nondistended. Normal bowel sounds. Extremities: Palpable radial and DP pulses. Normal ROM. Skin: Warm and dry.  No obvious rash or lesions. Neuro: A&Ox3. Moves all extremities. Normal sensation. No focal deficit. Psych: Pleasant mood and affect   Assessment & Plan:   Acute bronchitis Vocal cord dysfunction GERD ?Pulmonary HTN  Dyspnea  Patient is stable and improving with current management for vocal cord dysfunction and systemic steroids.  Patient could continue this treatment outpatient with close follow up in our clinic for her COPD and sarcoidosis. Infectious work up has been negative. TTE noted mild pericardial effusion which could be in setting of pulmonary hypertension as her main pulmonary artery was noted to be enlarged on CT imaging vs sarcoidosis. However, no evidence of R heart enlargement or increased R heart pressures on TTE today, decreasing our suspicion for pulmonary hypertension.  Patient has already been scheduled to be seen by Dr. Vaughan Browner for pulmonology follow up. Will plan for outpatient PFTs and sleep study. Recommend continued monitoring of mild pericardial effusion outpatient per PCP.  Plan: Prednisone course for 5 days Continue -Dextromethorphan -Tessalon pearls -Warm drinks -Albuterol inhaler PRN Outpatient: -Follow up with Dr. Vaughan Browner -PFT -Polysomnography  Pulmonology will sign off. Please re-engage if patient clinical picture deteriorates.   Best Practice (right click and "Reselect all SmartList Selections" daily)   Diet/type: Regular consistency (see orders) DVT prophylaxis: LMWH GI prophylaxis: PPI Lines: N/A Foley:  N/A Code Status:  full code Last date of multidisciplinary goals of care discussion [none]  Labs   CBC: Recent Labs  Lab 06/05/22 1842 06/06/22 0443 06/07/22 0104  WBC 13.5* 9.8 12.6*  HGB 10.7* 10.8* 10.5*  HCT 32.6* 33.0* 31.0*  MCV 92.4 94.3 91.7  PLT 372 339 147    Basic Metabolic Panel: Recent Labs  Lab 06/05/22 1842 06/06/22 0443 06/07/22 0104 06/08/22 0918  NA 137 135 136 137  K 4.3 4.1 4.2 4.0  CL 101 100 100 102   CO2 '24 24 25 28  '$ GLUCOSE 191* 274* 329* 302*  BUN 22 22 30* 27*  CREATININE 1.21* 1.18* 1.40* 1.11*  CALCIUM 9.6 9.2 9.1 9.1   GFR: Estimated Creatinine Clearance: 62.6 mL/min (A) (by C-G formula based on SCr of 1.11 mg/dL (H)). Recent Labs  Lab 06/05/22 1842 06/06/22 0443 06/07/22 0104  WBC 13.5* 9.8 12.6*    Liver Function Tests: No results for input(s): "AST", "ALT", "ALKPHOS", "BILITOT", "PROT", "ALBUMIN" in the last 168 hours. No results for input(s): "LIPASE", "AMYLASE" in the last 168 hours. No results for input(s): "AMMONIA" in the last 168 hours.  ABG    Component Value Date/Time   PHART 7.469 (H) 09/23/2017 0325   PCO2ART 35.4 09/23/2017 0325   PO2ART 198 (H) 09/23/2017 0325   HCO3 25.4 09/23/2017 0325   TCO2 25 09/20/2017 1431   ACIDBASEDEF 1.8 09/20/2017 2042   O2SAT 99.6 09/23/2017 0325     Coagulation Profile: No results for input(s): "INR", "PROTIME" in the last 168 hours.  Cardiac Enzymes: No results for input(s): "CKTOTAL", "CKMB", "CKMBINDEX", "TROPONINI" in the last 168 hours.  HbA1C: Hemoglobin A1C  Date/Time Value Ref Range Status  02/02/2022 01:32 PM 8.8 (A) 4.0 - 5.6 % Final  12/03/2021 08:10 AM 8.7 (A) 4.0 - 5.6 % Final  08/08/2020 12:00 AM 7.9  Final   Hgb A1c MFr Bld  Date/Time Value Ref  Range Status  04/29/2022 10:14 AM 8.4 (H) 4.6 - 6.5 % Final    Comment:    Glycemic Control Guidelines for People with Diabetes:Non Diabetic:  <6%Goal of Therapy: <7%Additional Action Suggested:  >8%   10/18/2019 09:19 AM 9.2 (H) 4.8 - 5.6 % Final    Comment:    (NOTE) Pre diabetes:          5.7%-6.4% Diabetes:              >6.4% Glycemic control for   <7.0% adults with diabetes     CBG: Recent Labs  Lab 06/07/22 1729 06/07/22 1954 06/07/22 2320 06/08/22 0404 06/08/22 0747  GLUCAP 319* 340* 374* 255* 235*    Review of Systems:   Pertinent per HPI and interim history  Past Medical History:  She,  has a past medical history of  Abnormal SPEP (04/17/2014), Acute left ankle pain (01/26/2017), ANEMIA-UNSPECIFIED (09/18/2009), Anxiety, Arthritis, Blood transfusion without reported diagnosis, Cataract, CHF (congestive heart failure) ([place]), Chronic diastolic heart failure, NYHA class 2 ([place]), COPD (chronic obstructive pulmonary disease) ([place]), Depression, DIABETES MELLITUS, TYPE II (08/21/2006), Diabetic osteomyelitis ([place]) (05/29/2015), Fatty liver, Fracture of 5th metatarsal, GERD (08/21/2006), GOUT (08/20/2010), Hammer toe of right foot, umbilical hernia repair, HYPERLIPIDEMIA (08/21/2006), HYPERTENSION (08/21/2006), Infection of wound due to methicillin resistant Staphylococcus aureus (MRSA), Internal hemorrhoids, Kidney problem, Multiple allergies (10/14/2016), OBESITY (06/04/2009), Onychomycosis (10/27/2015), Osteomyelitis of left foot ([place]) (05/29/2015), Osteoporosis, Pulmonary sarcoidosis ([place]), Right knee pain (01/26/2017), Vocal cord dysfunction, and Wears partial dentures.   Surgical History:   Past Surgical History:  Procedure Laterality Date   ABDOMINAL HYSTERECTOMY     APPENDECTOMY     BLADDER SUSPENSION  11/11/2011   Procedure: TRANSVAGINAL TAPE (TVT) PROCEDURE;  Surgeon: Olga Millers, MD;  Location: [facility];  Service: Gynecology;  Laterality: N/A;   CAROTIDS  02/18/2011   CAROTID DUPLEX; VERTEBRALS ARE PATENT WITH ANTEGRADE FLOW. ICA/CCA RATIO 1.61 ON RIGHT AND 0.75 ON LEFT   CATARACT EXTRACTION, BILATERAL     CHOLECYSTECTOMY  1984   COLONOSCOPY  04/29/2010   [name]   CYSTOSCOPY  11/11/2011   Procedure: CYSTOSCOPY;  Surgeon: Olga Millers, MD;  Location: [facility];  Service: Gynecology;  Laterality: N/A;   EXTUBATION (ENDOTRACHEAL) IN OR N/A 09/23/2017   Procedure: EXTUBATION (ENDOTRACHEAL) IN OR;  Surgeon: Helayne Seminole, MD;  Location: [place];  Service: ENT;  Laterality: N/A;   FIBEROPTIC LARYNGOSCOPY AND TRACHEOSCOPY N/A 09/23/2017   Procedure: FLEXIBLE FIBEROPTIC LARYNGOSCOPY;  Surgeon: Helayne Seminole,  MD;  Location: [place];  Service: ENT;  Laterality: N/A;   FRACTURE SURGERY     foot   [place] Left 10/02/2018   Procedure: HALLUX ARTHRODESIS;  Surgeon: Edrick Kins, DPM;  Location: [facility];  Service: Podiatry;  Laterality: Left;   HAMMER TOE SURGERY Left 10/02/2018   Procedure: HAMMER TOE CORRECTION 2ND 3RD 4RD FIFTH TOE;  Surgeon: Edrick Kins, DPM;  Location: [facility];  Service: Podiatry;  Laterality: Left;   HAMMER TOE SURGERY Right 04/12/2019   Procedure: HAMMER TOE CORRECTION, 2ND, 3RD, 4TH AND 5TH TOES OF RIGHT FOOT;  Surgeon: Edrick Kins, DPM;  Location: [place];  Service: Podiatry;  Laterality: Right;   HAMMER TOE SURGERY Left 10/25/2019   Procedure: HAMMER TOE REPAIR SECOND, THIRD, FOUTH;  Surgeon: Edrick Kins, DPM;  Location: [facility];  Service: Podiatry;  Laterality: Left;   HERNIA REPAIR     I & D EXTREMITY Left 06/27/2015   Procedure: Partial  Excision Left Calcaneus, Place Antibiotic Beads, and Wound VAC;  Surgeon: Newt Minion, MD;  Location: [place];  Service: Orthopedics;  Laterality: Left;   JOINT REPLACEMENT     KNEE ARTHROSCOPY     right   LEFT AND RIGHT HEART CATHETERIZATION WITH CORONARY ANGIOGRAM N/A 04/23/2013   Procedure: LEFT AND RIGHT HEART CATHETERIZATION WITH CORONARY ANGIOGRAM;  Surgeon: Leonie Man, MD;  Location: [facility] C[facility];  Service: Cardiovascular;  Laterality: N/A;   LEFT HEART CATH AND CORONARY ANGIOGRAPHY N/A 03/11/2017   Procedure: Left Heart Cath and Coronary Angiography;  Surgeon: Sherren Mocha, MD;  Location: [place] CV LAB; angiographically minimal CAD in the LAD otherwise normal.;  Normal LVEDP.  FALSE POSITIVE MYOVIEW   LEFT HEART CATH AND CORONARY ANGIOGRAPHY  07/20/2010   LVEF 50-55% WITH VERY MILD GLOBAL HYPOKINESIA; ESSENTIALLY NORMAL CORONARY ARTERIES; NORMAL LV FUNCTION   METATARSAL OSTEOTOMY WITH OPEN REDUCTION INTERNAL FIXATION (ORIF) METATARSAL WITH FUSION Left 04/09/2014   Procedure: LEFT FOOT FRACTURE OPEN TREATMENT  METATARSAL INCLUDES INTERNAL FIXATION EACH;  Surgeon: Lorn Junes, MD;  Location: [place];  Service: Orthopedics;  Laterality: Left;   NISSEN FUNDOPLICATION  6948   NM MYOVIEW LTD --> FALSE POSITIVE  03/10/2017   : Moderate size "stress-induced "perfusion defect at the apex as well as "ill-defined stress-induced perfusion defect in the lateral wall.  EF 55%.  INTERMEDIATE risk. -->  FALSE POSITIVE   ORIF DISTAL FEMUR FRACTURE  08/08/2020   [facility] [place]: ORIF of left extra-articular periprosthetic distal femur fracture with synthesis of the femur plate with cortical nonlocking screws.  (Thought to be pathologic fracture due to osteoporosis) -> after a fall   ORIF FEMUR W/ PERI-IMPLANT  08/29/2020   [facility]) revision of left femur fracture ORIF plate fixation screws; culture positive for MSSA   Right and left CARDIAC CATHETERIZATION  04/23/2013   Angiographic normal coronaries; LVEDP 20 mmHg, PCWP 12-14 mmHg, RAP 12 mmHg.; Fick CO/CI 4.9/2.2   ROTATOR CUFF REPAIR Right 12/09/2021   SHOULDER ARTHROSCOPY Left 03/14/2019   Procedure: LEFT SHOULDER ARTHROSCOPY, DEBRIDEMENT, AND DECOMPRESSION;  Surgeon: Newt Minion, MD;  Location: [place];  Service: Orthopedics;  Laterality: Left;   TOTAL KNEE ARTHROPLASTY Right 06/29/2017   Procedure: RIGHT TOTAL KNEE ARTHROPLASTY;  Surgeon: Newt Minion, MD;  Location: [place];  Service: Orthopedics;  Laterality: Right;   TOTAL KNEE ARTHROPLASTY Left 12/07/2017   TOTAL KNEE ARTHROPLASTY Left 12/07/2017   Procedure: LEFT TOTAL KNEE ARTHROPLASTY;  Surgeon: Newt Minion, MD;  Location: [place];  Service: Orthopedics;  Laterality: Left;   TRACHEOSTOMY TUBE PLACEMENT N/A 09/20/2017   Procedure: AWAKE INTUBATION WITH ANESTHESIA WITH VIDEO ASSISTANCE;  Surgeon: Helayne Seminole, MD;  Location: [facility];  Service: ENT;  Laterality: N/A;   TRANSTHORACIC ECHOCARDIOGRAM  08/2014   Normal LV size and function.  Mild LVH.  EF  55-60%.  Normal regional wall motion.  GR 1 DD.  Normal RV size and function .   TRANSTHORACIC ECHOCARDIOGRAM  08/12/2020    [facility]): Normal LV size and function.  EF 55 to 60%.  No RWM A.  Normal filling pattern.  Normal RV.  No significant valvular disease-stenosis or regurgitation.  No vegetation.   TUBAL LIGATION     with reversal in [place]       Social History:   reports that she has  never smoked. She has never used smokeless tobacco. She reports that she does not drink alcohol and does not use drugs.   Family History:  Her family history includes Asthma in her mother; Breast cancer in her mother; COPD in her mother; Coronary artery disease in her father; Diabetes in her brother, father, and mother; Emphysema in her mother; Heart attack in her maternal grandfather; Heart attack (age of onset: 67) in her father; Heart disease in her father; Heart failure in her father and mother; Hypertension in her father; Kidney disease in her mother; Lung cancer in her brother; Obesity in her father; Sarcoidosis in her maternal uncle; Sleep apnea in her father; Throat cancer in her father; Thyroid disease in her mother. There is no history of Colon cancer, Colon polyps, Esophageal cancer, Rectal cancer, or Stomach cancer.   Allergies Allergies  Allergen Reactions   Methotrexate Other (See Comments)    Peri-oral and buccal lesions   Vancomycin Other (See Comments)    DOSE RELATED NEPHROTOXICITY Other reaction(s): other   Lisinopril Cough    Other reaction(s): other   Chlorhexidine Itching   Clindamycin/Lincomycin Nausea And Vomiting and Rash   Lincomycin Nausea Only and Rash    Other reaction(s): vomiting   Teflaro [Ceftaroline] Rash and Other (See Comments)    Tolerates ceftriaxone      Home Medications  Prior to Admission medications   Medication Sig Start Date End Date Taking? Authorizing Provider  albuterol  (PROVENTIL HFA;VENTOLIN HFA) 108 (90 Base) MCG/ACT inhaler Inhale 1-2 puffs into the lungs every 6 (six) hours as needed for wheezing or shortness of breath. 01/04/19  Yes [name], MD  albuterol (PROVENTIL) (2.5 MG/3ML) 0.083% nebulizer solution Take 2.5 mg by nebulization every 6 (six) hours as needed for wheezing or shortness of breath.   Yes [provider]  allopurinol (ZYLOPRIM) 300 MG tablet TAKE 1 TABLET BY MOUTH EVERY DAY 12/17/21  Yes Aundra Dubin, PA-C  aspirin EC 81 MG tablet Take 1 tablet (81 mg total) by mouth daily. 11/03/20  Yes Leonie Man, MD  atorvastatin (LIPITOR) 40 MG tablet TAKE 1 TABLET BY MOUTH EVERY DAY 08/31/21  Yes [name], MD  benzonatate (TESSALON PERLES) 100 MG capsule Take 2 capsules (200 mg total) by mouth 3 (three) times daily as needed for cough. 05/25/22  Yes Hudnell, Stephanie, NP  BREO ELLIPTA 200-25 MCG/INH AEPB TAKE 1 PUFF BY MOUTH EVERY DAY Patient taking differently: Inhale 1 puff into the lungs daily. 06/04/19  Yes [name], MD  buPROPion (WELLBUTRIN XL) 300 MG 24 hr tablet TAKE 1 TABLET BY MOUTH EVERY DAY 01/04/22  Yes [name], MD  carvedilol (COREG) 25 MG tablet Take 1 tablet by mouth daily.   Yes [provider]  Cholecalciferol 25 MCG (1000 UT) capsule Take 1,000 Units by mouth in the morning and at bedtime. 10/26/11  Yes [provider]  diclofenac Sodium (VOLTAREN) 1 % GEL Apply 1 application topically as needed (pain.).    Yes [provider]  diltiazem (CARDIZEM CD) 120 MG 24 hr capsule TAKE 1 CAPSULE BY MOUTH EVERY DAY 01/28/22  Yes Leonie Man, MD  Dulaglutide (TRULICITY) 4.5 MV/6.7MC SOPN Inject 4.5 mg as directed once a week. 02/02/22  Yes Renato Shin, MD  famotidine (PEPCID) 20 MG tablet Take 1 tablet (20 mg total) by mouth at bedtime. 12/13/18  Yes Willia Craze, NP  fenofibrate 160 MG tablet TAKE 1 TABLET BY MOUTH EVERY DAY  01/04/22  Yes Leonie Man, MD   fluticasone [facility]) 50 MCG/ACT nasal spray Place 2 sprays into both nostrils daily as needed for allergies. 01/22/20  Yes [name], MD  furosemide (LASIX) 40 MG tablet TAKE 1 TABLET BY MOUTH TWICE A DAY Patient taking differently: Take 40 mg by mouth daily. 12/17/21  Yes [name], MD  gabapentin (NEURONTIN) 100 MG capsule TAKE 1 CAPSULE BY MOUTH THREE TIMES A DAY Patient taking differently: Take 100 mg by mouth 3 (three) times daily. TAKE 1 CAPSULE BY MOUTH THREE TIMES A DAY 02/02/22  Yes [name], MD  HYDROcodone-acetaminophen (NORCO/VICODIN) 5-325 MG tablet Take 1 tablet by mouth every 6 (six) hours as needed. 03/29/22  Yes Gregor Hams, MD  insulin aspart (NOVOLOG FLEXPEN) 100 UNIT/ML FlexPen Inject 15 Units into the skin daily with supper. 11/06/21  Yes Renato Shin, MD  insulin glargine (LANTUS SOLOSTAR) 100 UNIT/ML Solostar Pen Inject 16 Units into the skin every morning. Patient taking differently: Inject 18 Units into the skin every morning. 07/30/21  Yes Renato Shin, MD  KLOR-CON M20 20 MEQ tablet TAKE 1 & 1/2 TABLETS BY MOUTH TWICE A DAY Patient taking differently: Take 30 mEq by mouth 2 (two) times daily. 01/27/22  Yes [name], MD  LINZESS 290 MCG CAPS capsule TAKE 1 CAPSULE BY MOUTH DAILY BEFORE BREAKFAST. Patient taking differently: Take 290 mcg by mouth daily as needed (IBS symptoms). 09/07/21  Yes Irene Shipper, MD  meloxicam (MOBIC) 15 MG tablet Take 1 tablet (15 mg total) by mouth daily. Patient taking differently: Take 15 mg by mouth daily as needed for pain. 06/24/21  Yes Marybelle Killings, MD  metFORMIN (GLUCOPHAGE-XR) 500 MG 24 hr tablet Take 4 tablets (2,000 mg total) by mouth daily with breakfast. 05/05/22  Yes Elayne Snare, MD  ondansetron (ZOFRAN-ODT) 4 MG disintegrating tablet Take 1 tablet (4 mg total) by mouth every 8 (eight) hours as needed for nausea or vomiting. 11/17/21  Yes Renato Shin, MD  pantoprazole (PROTONIX) 40 MG tablet Take 1  tablet (40 mg total) by mouth daily. 04/29/22  Yes [name], MD  telmisartan (MICARDIS) 20 MG tablet TAKE 1 TABLET BY MOUTH EVERY DAY 01/04/22  Yes [name], MD  venlafaxine XR (EFFEXOR-XR) 75 MG 24 hr capsule TAKE 1 CAPSULE BY MOUTH TWICE A DAY 01/28/22  Yes [name], MD  amoxicillin-clavulanate (AUGMENTIN) 875-125 MG tablet Take 1 tablet by mouth 2 (two) times daily. Patient not taking: Reported on 06/06/2022 04/28/22   Tawnya Crook, MD  Continuous Blood Gluc Sensor (FREESTYLE LIBRE 3 SENSOR) MISC 1 Device by Does not apply route every 14 (fourteen) days. Apply 1 sensor on upper arm every 14 days for continuous glucose monitoring 05/03/22   Elayne Snare, MD  gabapentin (NEURONTIN) 300 MG capsule Take at night in addition to the '100mg'$  gabapentin to get '400mg'$  at night and '100mg'$  in the morning and at mid day. 03/29/22   Gregor Hams, MD  glucose blood (CONTOUR NEXT TEST) test strip 1 each by Other route 4 (four) times daily. And lancets 4/day 09/19/19   Renato Shin, MD  Insulin Pen Needle (PEN NEEDLES) 32G X 4 MM MISC 1 each by Does not apply route 4 (four) times daily. E11.9 02/22/20   Renato Shin, MD  ipratropium (ATROVENT) 0.03 % nasal spray PLACE 2 SPRAYS INTO BOTH NOSTRILS EVERY 12 (TWELVE) HOURS. Patient not taking: Reported on 06/06/2022 03/11/21   Yong Channel,  Brayton Mars, MD  lactulose (CHRONULAC) 10 GM/15ML solution TAKE 15 MLS BY MOUTH DAILY AS NEEDED FOR CONSTIPATION. Patient not taking: Reported on 06/06/2022 10/07/20   [name], MD  Lancet Devices (EASY MINI EJECT LANCING DEVICE) MISC Use as directed 4 times daily to test blood sugar 11/04/20   Renato Shin, MD  metoCLOPramide (REGLAN) 10 MG tablet Take 1 tablet (10 mg total) by mouth every 6 (six) hours. Patient not taking: Reported on 11/02/4823 0/0/37   Delora Fuel, MD  Microlet Lancets MISC 1 each by Does not apply route 2 (two) times daily. E11.9 09/19/19   Renato Shin, MD  nitroGLYCERIN (NITROSTAT) 0.4 MG  SL tablet Place 1 tablet (0.4 mg total) under the tongue every 5 (five) minutes as needed for chest pain. Reported on 01/05/2016 01/10/18   Leonie Man, MD  phenazopyridine (PYRIDIUM) 100 MG tablet Take 1 tablet (100 mg total) by mouth 3 (three) times daily as needed for pain. Patient not taking: Reported on 06/06/2022 01/13/22   Tawnya Crook, MD  mupirocin nasal ointment (BACTROBAN) 2 % Place 1 application into the nose 2 (two) times daily. Use one-half of tube in each nostril twice daily for five (5) days. After application, press sides of nose together and gently massage. 03/28/18 03/28/18  [name], MD    Romana Juniper, MD 06/08/22

## 2022-06-08 NOTE — Plan of Care (Signed)

## 2022-06-08 NOTE — Progress Notes (Signed)
Mobility Specialist Progress Note    06/08/22 1400  Mobility  Activity Ambulated independently in hallway  Level of Assistance Standby assist, set-up cues, supervision of patient - no hands on  Assistive Device None  Distance Ambulated (ft) 420 ft  Activity Response Tolerated well  $Mobility charge 1 Mobility   Pre-Mobility: 67 HR, 95% SpO2 During Mobility: 80 HR, >/=93% SpO2 Post-Mobility: 75 HR, 96% SpO2  Pt received in bed and agreeable. No complaints on walk. Returned to bed with call bell in reach.    Hildred Alamin Mobility Specialist

## 2022-06-09 ENCOUNTER — Ambulatory Visit: Payer: BC Managed Care – PPO | Admitting: Family Medicine

## 2022-06-09 DIAGNOSIS — I5032 Chronic diastolic (congestive) heart failure: Secondary | ICD-10-CM | POA: Diagnosis not present

## 2022-06-09 DIAGNOSIS — R061 Stridor: Secondary | ICD-10-CM | POA: Diagnosis not present

## 2022-06-09 DIAGNOSIS — J441 Chronic obstructive pulmonary disease with (acute) exacerbation: Secondary | ICD-10-CM | POA: Diagnosis not present

## 2022-06-09 DIAGNOSIS — N1831 Chronic kidney disease, stage 3a: Secondary | ICD-10-CM | POA: Diagnosis not present

## 2022-06-09 LAB — GLUCOSE, CAPILLARY
Glucose-Capillary: 138 mg/dL — ABNORMAL HIGH (ref 70–99)
Glucose-Capillary: 318 mg/dL — ABNORMAL HIGH (ref 70–99)

## 2022-06-09 MED ORDER — ALLOPURINOL 300 MG PO TABS
300.0000 mg | ORAL_TABLET | Freq: Every day | ORAL | Status: DC
  Administered 2022-06-09: 300 mg via ORAL
  Filled 2022-06-09: qty 1

## 2022-06-09 MED ORDER — PREDNISONE 20 MG PO TABS: 20.0000 mg | ORAL_TABLET | Freq: Every day | ORAL | 0 refills | Status: DC

## 2022-06-09 MED ORDER — IPRATROPIUM-ALBUTEROL 0.5-2.5 (3) MG/3ML IN SOLN
3.0000 mL | RESPIRATORY_TRACT | Status: DC | PRN
Start: 2022-06-09 — End: 2022-06-09

## 2022-06-09 MED ORDER — FAMOTIDINE 20 MG PO TABS: 20.0000 mg | ORAL_TABLET | Freq: Every day | ORAL | Status: DC

## 2022-06-09 MED ORDER — FUROSEMIDE 40 MG PO TABS: 40.0000 mg | ORAL_TABLET | Freq: Every day | ORAL | Status: DC

## 2022-06-09 MED ORDER — POTASSIUM CHLORIDE CRYS ER 20 MEQ PO TBCR: 30.0000 meq | EXTENDED_RELEASE_TABLET | Freq: Every day | ORAL | Status: DC

## 2022-06-09 NOTE — Discharge Summary (Signed)
DISCHARGE SUMMARY  ALBENA COMES  MR#: [id number]  DOB:02/19/1961  Date of Admission: 06/05/2022 Date of Discharge: 06/09/2022  Attending Physician:Tyrica Afzal Hennie Duos, MD  Patient's RKY:[id number], Brayton Mars, MD  Consults: Pulmonary  Disposition: DC home  Follow-up Appts:  Follow-up Information     [name], MD Follow up.   Specialty: Family Medicine Contact information: [name] [address] [phone]         Marshell Garfinkel, MD. Schedule an appointment as soon as possible for a visit in 39 day(s).   Specialty: Pulmonary Disease Contact information: [place] [address] [phone]                 Tests Needing Follow-up: -Pulmonary is concerned she may be having ongoing GERD/failure of her Nissen - will need outpt eval for this   Discharge Diagnoses: COPD with acute exacerbation  Chronic diastolic CHF  CKD stage 3, GFR 30-59 ml/min Hypertension associated with diabetes Type II diabetes mellitus with renal manifestations Diabetic neuropathy Major depression  Class 2 obesity - BMI 35.8   Initial presentation: 61 year old with a history of HTN, chronic bronchitis, sarcoidosis, diastolic CHF, GERD, and DM2 who presented to the ER with a 3-week history of nonproductive cough and dyspnea.  Exam at presentation was remarkable for significant wheezing.  There was no focal infiltrate on CXR and CTa chest was without evidence of PE.  Hospital Course:  COPD with acute exacerbation  Tested negative for COVID x2  -history suggests she likely had COVID weeks prior to her presentation - her husband tested + for COVID 8/28 -continue bronchodilators and steroids at does suggested by PCCM - vocal cord dysfunction ruled out with ENT consultation 8/27 and fiberoptic eval noting normal vocal cord motion with no tracheal narrowing - to follow-up with Dr. Vaughan Browner as outpatient - much improved at time of d/c w/  significant improvement in air movement of exam and minimal exp wheezing - able to ambulate w/o dyspnea and sats 92% or greater or RA even w/ exertion    Chronic diastolic CHF  TTE noted preserved LV systolic function with EF 55 to 60% with moderate dilatation of internal cavity, RV systolic function is preserved. Small pericardial effusion, no significant valvular disease - well compensated during this admission   CKD stage 3, GFR 30-59 ml/min Renal function improved during his hospital stay - holding diuresis for another 3 days then resume as outpatient   Hypertension associated with diabetes Continue diltiazem, carvedilol and irbersartan   Type II diabetes mellitus with renal manifestations CBG poorly controlled with use of steroid - stop steroid after short course as outpt    Diabetic neuropathy Continue gabapentin   Major depression  continue venlafaxine and bupropion with as needed lorazepam   Class 2 obesity - BMI 35.8  Counseled on advisability of weight loss   Allergies as of 06/09/2022       Reactions   Methotrexate Other (See Comments)   Peri-oral and buccal lesions   Vancomycin Other (See Comments)   DOSE RELATED NEPHROTOXICITY Other reaction(s): other   Lisinopril Cough   Other reaction(s): other   Chlorhexidine Itching   Clindamycin/lincomycin Nausea And Vomiting, Rash   Lincomycin Nausea Only, Rash   Other reaction(s): vomiting   Teflaro [ceftaroline] Rash, Other (See Comments)   Tolerates ceftriaxone         Medication List     STOP taking these medications    ipratropium 0.03 %  nasal spray Commonly known as: ATROVENT   nitroGLYCERIN 0.4 MG SL tablet Commonly known as: NITROSTAT       TAKE these medications    albuterol (2.5 MG/3ML) 0.083% nebulizer solution Commonly known as: PROVENTIL Take 2.5 mg by nebulization every 6 (six) hours as needed for wheezing or shortness of breath.   albuterol 108 (90 Base) MCG/ACT inhaler Commonly known  as: VENTOLIN HFA Inhale 1-2 puffs into the lungs every 6 (six) hours as needed for wheezing or shortness of breath.   allopurinol 300 MG tablet Commonly known as: ZYLOPRIM TAKE 1 TABLET BY MOUTH EVERY DAY   aspirin EC 81 MG tablet Take 1 tablet (81 mg total) by mouth daily.   atorvastatin 40 MG tablet Commonly known as: LIPITOR TAKE 1 TABLET BY MOUTH EVERY DAY   benzonatate 100 MG capsule Commonly known as: Tessalon Perles Take 2 capsules (200 mg total) by mouth 3 (three) times daily as needed for cough.   Breo Ellipta 200-25 MCG/ACT Aepb Generic drug: fluticasone furoate-vilanterol TAKE 1 PUFF BY MOUTH EVERY DAY What changed: See the new instructions.   buPROPion 300 MG 24 hr tablet Commonly known as: WELLBUTRIN XL TAKE 1 TABLET BY MOUTH EVERY DAY   carvedilol 25 MG tablet Commonly known as: COREG Take 1 tablet by mouth daily.   Cholecalciferol 25 MCG (1000 UT) capsule Take 1,000 Units by mouth in the morning and at bedtime.   Contour Next Test test strip Generic drug: glucose blood 1 each by Other route 4 (four) times daily. And lancets 4/day   diclofenac Sodium 1 % Gel Commonly known as: VOLTAREN Apply 1 application topically as needed (pain.).   diltiazem 120 MG 24 hr capsule Commonly known as: CARDIZEM CD TAKE 1 CAPSULE BY MOUTH EVERY DAY   Easy Mini Eject Lancing Device Misc Use as directed 4 times daily to test blood sugar   famotidine 20 MG tablet Commonly known as: Pepcid Take 1 tablet (20 mg total) by mouth at bedtime.   fenofibrate 160 MG tablet TAKE 1 TABLET BY MOUTH EVERY DAY   fluticasone 50 MCG/ACT nasal spray Commonly known as: FLONASE Place 2 sprays into both nostrils daily as needed for allergies.   FreeStyle Libre 3 Sensor Misc 1 Device by Does not apply route every 14 (fourteen) days. Apply 1 sensor on upper arm every 14 days for continuous glucose monitoring   furosemide 40 MG tablet Commonly known as: LASIX Take 1 tablet (40 mg  total) by mouth daily. Start taking on: June 12, 2022 What changed:  when to take this These instructions start on June 12, 2022. If you are unsure what to do until then, ask your doctor or other care provider.   gabapentin 100 MG capsule Commonly known as: NEURONTIN TAKE 1 CAPSULE BY MOUTH THREE TIMES A DAY What changed: See the new instructions.   gabapentin 300 MG capsule Commonly known as: NEURONTIN Take at night in addition to the '100mg'$  gabapentin to get '400mg'$  at night and '100mg'$  in the morning and at mid day. What changed: Another medication with the same name was changed. Make sure you understand how and when to take each.   HYDROcodone-acetaminophen 5-325 MG tablet Commonly known as: NORCO/VICODIN Take 1 tablet by mouth every 6 (six) hours as needed.   Lantus SoloStar 100 UNIT/ML Solostar Pen Generic drug: insulin glargine Inject 16 Units into the skin every morning. What changed: how much to take   Linzess 290 MCG Caps capsule Generic drug: linaclotide  TAKE 1 CAPSULE BY MOUTH DAILY BEFORE BREAKFAST. What changed: See the new instructions.   meloxicam 15 MG tablet Commonly known as: MOBIC Take 1 tablet (15 mg total) by mouth daily. What changed:  when to take this reasons to take this   metFORMIN 500 MG 24 hr tablet Commonly known as: GLUCOPHAGE-XR Take 4 tablets (2,000 mg total) by mouth daily with breakfast.   Microlet Lancets Misc 1 each by Does not apply route 2 (two) times daily. E11.9   NovoLOG FlexPen 100 UNIT/ML FlexPen Generic drug: insulin aspart Inject 15 Units into the skin daily with supper.   ondansetron 4 MG disintegrating tablet Commonly known as: ZOFRAN-ODT Take 1 tablet (4 mg total) by mouth every 8 (eight) hours as needed for nausea or vomiting.   pantoprazole 40 MG tablet Commonly known as: PROTONIX Take 1 tablet (40 mg total) by mouth daily.   Pen Needles 32G X 4 MM Misc 1 each by Does not apply route 4 (four) times daily.  E11.9   potassium chloride SA 20 MEQ tablet Commonly known as: Klor-Con M20 Take 1.5 tablets (30 mEq total) by mouth daily. Start taking on: June 12, 2022 What changed:  See the new instructions. These instructions start on June 12, 2022. If you are unsure what to do until then, ask your doctor or other care provider.   predniSONE 20 MG tablet Commonly known as: DELTASONE Take 1 tablet (20 mg total) by mouth daily with breakfast for 5 days. Start taking on: June 10, 2022   telmisartan 20 MG tablet Commonly known as: MICARDIS TAKE 1 TABLET BY MOUTH EVERY DAY   Trulicity 4.5 GG/8.3MO Sopn Generic drug: Dulaglutide Inject 4.5 mg as directed once a week.   venlafaxine XR 75 MG 24 hr capsule Commonly known as: EFFEXOR-XR TAKE 1 CAPSULE BY MOUTH TWICE A DAY        Day of Discharge BP (!) 145/54 (BP Location: Left Arm)   Pulse 64   Temp 98.1 F (36.7 C) (Oral)   Resp 18   Ht '5\' 6"'$  (1.676 m)   Wt 99.7 kg   SpO2 97%   BMI 35.48 kg/m   Physical Exam: General: No acute respiratory distress Lungs: Clear to auscultation bilaterally - minimal exp wheezing - no prolongation of exp phase Cardiovascular: Regular rate and rhythm without murmur gallop or rub normal S1 and S2 Abdomen: Nontender, nondistended, soft, bowel sounds positive, no rebound, no ascites, no appreciable mass Extremities: No significant cyanosis, clubbing, or edema bilateral lower extremities  Basic Metabolic Panel: Recent Labs  Lab 06/05/22 1842 06/06/22 0443 06/07/22 0104 06/08/22 0918  NA 137 135 136 137  K 4.3 4.1 4.2 4.0  CL 101 100 100 102  CO2 '24 24 25 28  '$ GLUCOSE 191* 274* 329* 302*  BUN 22 22 30* 27*  CREATININE 1.21* 1.18* 1.40* 1.11*  CALCIUM 9.6 9.2 9.1 9.1    CBC: Recent Labs  Lab 06/05/22 1842 06/06/22 0443 06/07/22 0104  WBC 13.5* 9.8 12.6*  HGB 10.7* 10.8* 10.5*  HCT 32.6* 33.0* 31.0*  MCV 92.4 94.3 91.7  PLT 372 339 349   Time spent in discharge (includes  decision making & examination of pt): 35 minutes  06/09/2022, 1:08 PM   Cherene Altes, MD [facility] Office  [phone]

## 2022-06-09 NOTE — Progress Notes (Signed)
OT Cancellation Note  Patient Details Name: [name] MRN: [id number] DOB: [birth year]   Cancelled Treatment:    Reason Eval/Treat Not Completed: OT screened, no needs identified, will sign off.  Per evaluating PT, patient is at baseline with no acute care therapeutic needs.    Romain Erion D Alayjah Boehringer 06/09/2022, 8:14 AM 06/09/2022  RP, OTR/L  Acute Rehabilitation Services  Office:  [phone]

## 2022-06-09 NOTE — TOC Progression Note (Signed)
Transition of Care [facility]) - Progression Note    Patient Details  Name: [name] MRN: [id number] Date of Birth: 11-Jun-1961  Transition of [facility]) CM/SW [place], RN Phone [phone]  06/09/2022, 1:13 PM  Clinical Narrative:    Patient with discharge orders. No TOC needs noted        Expected Discharge Plan and Services           Expected Discharge Date: 06/09/22                                     Social Determinants of Health (SDOH) Interventions    Readmission Risk Interventions     No data to display

## 2022-06-09 NOTE — Inpatient Diabetes Management (Signed)
Inpatient Diabetes Program Recommendations  AACE/ADA: New Consensus Statement on Inpatient Glycemic Control (2015)  Target Ranges:  Prepandial:   less than 140 mg/dL      Peak postprandial:   less than 180 mg/dL (1-2 hours)      Critically ill patients:  140 - 180 mg/dL   Lab Results  Component Value Date   GLUCAP 138 (H) 06/09/2022   HGBA1C 8.4 (H) 04/29/2022    Review of Glycemic Control  Latest Reference Range & Units 06/08/22 11:59 06/08/22 17:05 06/08/22 21:23 06/09/22 06:26  Glucose-Capillary 70 - 99 mg/dL 218 (H) 310 (H) 441 (H) 138 (H)  (H): Data is abnormally high Diabetes history: DM 2 Outpatient Diabetes medications:  Trulicity 4.5 mg weekly Novolog 15 units with supper Lantus 18 units daily Metformin 2000 mg with breakfast Current orders for Inpatient glycemic control:  Semglee 20 units daily Novolog 0-15 units TID Novolog 3 units TID Prednisone 40 mg QA  Inpatient Diabetes Program Recommendations:    Consider increasing Semglee to 30 units QD  Thanks, Bronson Curb, MSN, RNC-OB Diabetes Coordinator [phone] (8a-5p)

## 2022-06-09 NOTE — Evaluation (Signed)
Physical Therapy Evaluation & Discharge Patient Details Name: [name] MRN: [id number] DOB: Jun 24, 1961 Today's Date: 06/09/2022  History of Present Illness  [name] a 61 y.o. female admitted 06/05/22 with dyspnea; workup for COPD exacerbation. PMH includes HTN, chronic bronchitis, sarcoidosis, CHF, GERD, bilateral TKA, gout, T2DM.   Clinical Impression  Patient evaluated by Physical Therapy with no further acute PT needs identified. PTA, pt independent, lives with husband, on disability from work. Today, pt independent with mobility and ADL tasks; pt notes improvement in SOB, slight cough noted. All education has been completed and the patient has no further questions. Encouraged more frequent hallway ambulation while admitted. Acute [name] signing off. Thank you for this referral.    SpO2 97%, HR 71 at rest SpO2 94%, HR 84 with ambulation    Recommendations for follow up therapy are one component of a multi-disciplinary discharge planning process, led by the attending physician.  Recommendations may be updated based on patient status, additional functional criteria and insurance authorization.  Follow Up Recommendations No PT follow up      Assistance Recommended at Discharge None  Patient can return home with the following   N/A    Equipment Recommendations None recommended by PT  Recommendations for Other Services   Mobility Specialist   Functional Status Assessment Patient has not had a recent decline in their functional status     Precautions / Restrictions Precautions Precautions: None Restrictions Weight Bearing Restrictions: No      Mobility  Bed Mobility Overal bed mobility: Independent                  Transfers Overall transfer level: Independent Equipment used: None                    Ambulation/Gait Ambulation/Gait assistance: Independent Gait Distance (Feet): 500 Feet Assistive device: None Gait Pattern/deviations: WFL(Within  Functional Limits)       General Gait Details: steady ambulation indep without DME; no overt LOB or instability noted  Stairs            Wheelchair Mobility    Modified Rankin (Stroke Patients Only)       Balance Overall balance assessment: No apparent balance deficits (not formally assessed)                           High level balance activites: Side stepping, Backward walking, Direction changes, Turns, Sudden stops, Head turns High Level Balance Comments: no overt LOB or instability noted with observed higher level balance tasks             Pertinent Vitals/Pain Pain Assessment Pain Assessment: No/denies pain    Home Living Family/patient expects to be discharged to:: Private residence Living Arrangements: Spouse/significant other Available Help at Discharge: Family;Available 24 hours/day Type of Home: House Home Access: Stairs to enter Entrance Stairs-Rails: Right Entrance Stairs-Number of Steps: 2   Home Layout: One level Home Equipment: Shower seat;Grab bars - tub/shower Additional Comments: husband works from home    Prior Function Prior Level of Function : Independent/Modified Independent;Driving             Mobility Comments: independent without DME; does not work (on disability for sarcoidosis)       Hand Dominance        Extremity/Trunk Assessment   Upper Extremity Assessment Upper Extremity Assessment: Overall WFL for tasks assessed    Lower Extremity Assessment Lower Extremity Assessment: Overall  WFL for tasks assessed       Communication   Communication: No difficulties  Cognition Arousal/Alertness: Awake/alert Behavior During Therapy: WFL for tasks assessed/performed Overall Cognitive Status: Within Functional Limits for tasks assessed                                          General Comments General comments (skin integrity, edema, etc.): SpO2 97% on RA at rest, SpO2 94% on RA with  ambulation    Exercises     Assessment/Plan    PT Assessment Patient does not need any further PT services  PT Problem List         PT Treatment Interventions      PT Goals (Current goals can be found in the Care Plan section)  Acute Rehab PT Goals PT Goal Formulation: All assessment and education complete, DC therapy    Frequency       Co-evaluation               AM-PAC PT "6 Clicks" Mobility  Outcome Measure Help needed turning from your back to your side while in a flat bed without using bedrails?: None Help needed moving from lying on your back to sitting on the side of a flat bed without using bedrails?: None Help needed moving to and from a bed to a chair (including a wheelchair)?: None Help needed standing up from a chair using your arms (e.g., wheelchair or bedside chair)?: None Help needed to walk in hospital room?: None Help needed climbing 3-5 steps with a railing? : None 6 Click Score: 24    End of Session   Activity Tolerance: Patient tolerated treatment well Patient left: in bed;with call bell/phone within reach Nurse Communication: Mobility status PT Visit Diagnosis: Other abnormalities of gait and mobility (R26.89)    Time: 6378-5885 PT Time Calculation (min) (ACUTE ONLY): 10 min   Charges:   PT Evaluation $PT Eval Low Complexity: [place], PT, DPT Acute Rehabilitation Services  Personal: [facility] Office: [phone]  Derry Lory 06/09/2022, 8:05 AM

## 2022-06-15 ENCOUNTER — Encounter: Payer: Self-pay | Admitting: Family Medicine

## 2022-06-15 ENCOUNTER — Ambulatory Visit (INDEPENDENT_AMBULATORY_CARE_PROVIDER_SITE_OTHER): Payer: BC Managed Care – PPO | Admitting: Family Medicine

## 2022-06-15 ENCOUNTER — Other Ambulatory Visit (INDEPENDENT_AMBULATORY_CARE_PROVIDER_SITE_OTHER): Payer: BC Managed Care – PPO

## 2022-06-15 VITALS — BP 138/64 | HR 80 | Temp 98.2°F | Ht 66.0 in | Wt 219.2 lb

## 2022-06-15 DIAGNOSIS — J441 Chronic obstructive pulmonary disease with (acute) exacerbation: Secondary | ICD-10-CM | POA: Diagnosis not present

## 2022-06-15 DIAGNOSIS — D860 Sarcoidosis of lung: Secondary | ICD-10-CM | POA: Diagnosis not present

## 2022-06-15 DIAGNOSIS — Z23 Encounter for immunization: Secondary | ICD-10-CM

## 2022-06-15 DIAGNOSIS — K219 Gastro-esophageal reflux disease without esophagitis: Secondary | ICD-10-CM

## 2022-06-15 DIAGNOSIS — I5032 Chronic diastolic (congestive) heart failure: Secondary | ICD-10-CM

## 2022-06-15 DIAGNOSIS — I110 Hypertensive heart disease with heart failure: Secondary | ICD-10-CM

## 2022-06-15 DIAGNOSIS — E1122 Type 2 diabetes mellitus with diabetic chronic kidney disease: Secondary | ICD-10-CM | POA: Diagnosis not present

## 2022-06-15 DIAGNOSIS — Z9889 Other specified postprocedural states: Secondary | ICD-10-CM

## 2022-06-15 DIAGNOSIS — N1831 Chronic kidney disease, stage 3a: Secondary | ICD-10-CM

## 2022-06-15 DIAGNOSIS — Z794 Long term (current) use of insulin: Secondary | ICD-10-CM

## 2022-06-15 LAB — COMPREHENSIVE METABOLIC PANEL
ALT: 19 U/L (ref 0–35)
AST: 19 U/L (ref 0–37)
Albumin: 4.1 g/dL (ref 3.5–5.2)
Alkaline Phosphatase: 64 U/L (ref 39–117)
BUN: 20 mg/dL (ref 6–23)
CO2: 27 mEq/L (ref 19–32)
Calcium: 9.4 mg/dL (ref 8.4–10.5)
Chloride: 98 mEq/L (ref 96–112)
Creatinine, Ser: 1.11 mg/dL (ref 0.40–1.20)
GFR: 53.14 mL/min — ABNORMAL LOW (ref >60.00)
Glucose, Bld: 238 mg/dL — ABNORMAL HIGH (ref 70–99)
Potassium: 3.9 mEq/L (ref 3.5–5.1)
Sodium: 136 mEq/L (ref 135–145)
Total Bilirubin: 0.6 mg/dL (ref 0.2–1.2)
Total Protein: 6.8 g/dL (ref 6.0–8.3)

## 2022-06-15 MED ORDER — HYDROCOD POLI-CHLORPHE POLI ER 10-8 MG/5ML PO SUER: 5.0000 mL | Freq: Every evening | ORAL | 0 refills | Status: DC | PRN

## 2022-06-15 MED ORDER — DEXLANSOPRAZOLE 60 MG PO CPDR: 60.0000 mg | DELAYED_RELEASE_CAPSULE | Freq: Every day | ORAL | 11 refills | Status: DC

## 2022-06-15 NOTE — Addendum Note (Signed)
Addended by: Clyde Lundborg A on: 06/15/2022 09:05 AM   Modules accepted: Orders

## 2022-06-15 NOTE — Progress Notes (Signed)
Phone [phone] In person visit   Subjective:   [name] is a 61 y.o. year old very pleasant female patient who presents for/with See problem oriented charting Chief Complaint  Patient presents with   Follow-up    [name] here for hospital f/u for [place], pt states she is feeling better.   Past Medical History-  Patient Active Problem List   Diagnosis Date Noted   Severe persistent asthma 01/04/2019    Priority: High   Subcortical microvascular ischemic occlusive disease 07/12/2018    Priority: High   COPD with acute exacerbation ([place]) 09/18/2013    Priority: High   Chest pain 04/11/2013    Priority: High   Hypertensive heart disease with chronic diastolic congestive heart failure ([place])     Priority: High   Sarcoidosis of lung ([place]) 04/10/2007    Priority: High   Type II diabetes mellitus with renal manifestations ([place]) 08/21/2006    Priority: High   Hypertriglyceridemia 04/02/2020    Priority: Medium    Aortic atherosclerosis ([place]) 02/14/2020    Priority: Medium    Constipation 04/21/2018    Priority: Medium    Epiglottitis     Priority: Medium    Osteoarthritis of right knee 01/26/2017    Priority: Medium    CKD (chronic kidney disease) stage 3, GFR 30-59 ml/min ([place]) 04/27/2015    Priority: Medium    History of osteomyelitis 03/27/2015    Priority: Medium    Fatty liver 09/30/2014    Priority: Medium    Major depression in full remission ([place])     Priority: Medium    Gout 08/20/2010    Priority: Medium    Deficiency anemia 09/18/2009    Priority: Medium    Sleep apnea 04/21/2009    Priority: Medium    Hyperlipidemia associated with type 2 diabetes mellitus ([place]) 08/21/2006    Priority: Medium    Hypertension associated with diabetes ([place]) 08/21/2006    Priority: Medium    Nontraumatic complete tear of left rotator cuff     Priority: Low   Diabetic neuropathy ([place]) 02/20/2019    Priority: Low   Rapid palpitations 07/12/2018    Priority: Low    Impingement of left ankle joint 06/26/2018    Priority: Low   Total knee replacement status, left 12/07/2017    Priority: Low   Dysphagia 09/20/2017    Priority: Low   Plantar fasciitis, right 07/13/2017    Priority: Low   S/P total knee arthroplasty, right 06/29/2017    Priority: Low   Osteoarthrosis, localized, primary, knee, right     Priority: Low   Sprain of calcaneofibular ligament of right ankle 06/06/2017    Priority: Low   Onychomycosis 10/27/2015    Priority: Low   MRSA (methicillin resistant staph aureus) culture positive 03/27/2015    Priority: Low   Hot flashes 07/15/2014    Priority: Low   Abnormal SPEP 04/17/2014    Priority: Low   Fracture of left leg 04/17/2014    Priority: Low   Internal hemorrhoids     Priority: Low   Preoperative clearance 03/25/2014    Priority: Low   Solitary pulmonary nodule, on CT 02/2013 - stable over 2 years in 2015 02/20/2013    Priority: Low   GERD 08/21/2006    Priority: Low   Class 2 obesity 06/06/2022   Chronic pain of left wrist 04/19/2022   Preop cardiovascular exam 12/07/2021   Facet arthropathy, cervical 06/29/2021   Foraminal stenosis of  cervical region 06/29/2021   Class II obesity 02/02/2021   Iron deficiency anemia 10/01/2020   Postural dizziness with presyncope 09/30/2020   Chronic diastolic CHF (congestive heart failure) ([place]) 08/13/2017   Pulmonary sarcoidosis ([place])    Uncontrolled type 2 diabetes mellitus with hyperglycemia ([place]) 08/21/2006    Medications- reviewed and updated Current Outpatient Medications  Medication Sig Dispense Refill   albuterol (PROVENTIL HFA;VENTOLIN HFA) 108 (90 Base) MCG/ACT inhaler Inhale 1-2 puffs into the lungs every 6 (six) hours as needed for wheezing or shortness of breath. 8 g 5   albuterol (PROVENTIL) (2.5 MG/3ML) 0.083% nebulizer solution Take 2.5 mg by nebulization every 6 (six) hours as needed for wheezing or shortness of breath.     allopurinol (ZYLOPRIM) 300 MG tablet  TAKE 1 TABLET BY MOUTH EVERY DAY 90 tablet 0   aspirin EC 81 MG tablet Take 1 tablet (81 mg total) by mouth daily. 30 tablet 11   atorvastatin (LIPITOR) 40 MG tablet TAKE 1 TABLET BY MOUTH EVERY DAY 90 tablet 3   benzonatate (TESSALON PERLES) 100 MG capsule Take 2 capsules (200 mg total) by mouth 3 (three) times daily as needed for cough. 20 capsule 0   BREO ELLIPTA 200-25 MCG/INH AEPB TAKE 1 PUFF BY MOUTH EVERY DAY (Patient taking differently: Inhale 1 puff into the lungs daily.) 60 each 5   buPROPion (WELLBUTRIN XL) 300 MG 24 hr tablet TAKE 1 TABLET BY MOUTH EVERY DAY 90 tablet 3   carvedilol (COREG) 25 MG tablet Take 1 tablet by mouth daily.     chlorpheniramine-HYDROcodone (TUSSIONEX) 10-8 MG/5ML Take 5 mLs by mouth at bedtime as needed for cough (do not drive for 8 hours after taking). 70 mL 0   Cholecalciferol 25 MCG (1000 UT) capsule Take 1,000 Units by mouth in the morning and at bedtime.     Continuous Blood Gluc Sensor (FREESTYLE LIBRE 3 SENSOR) MISC 1 Device by Does not apply route every 14 (fourteen) days. Apply 1 sensor on upper arm every 14 days for continuous glucose monitoring 2 each 2   dexlansoprazole (DEXILANT) 60 MG capsule Take 1 capsule (60 mg total) by mouth daily. 30 capsule 11   diclofenac Sodium (VOLTAREN) 1 % GEL Apply 1 application topically as needed (pain.).      diltiazem (CARDIZEM CD) 120 MG 24 hr capsule TAKE 1 CAPSULE BY MOUTH EVERY DAY 90 capsule 3   Dulaglutide (TRULICITY) 4.5 PP/2.9JJ SOPN Inject 4.5 mg as directed once a week. 6 mL 3   famotidine (PEPCID) 20 MG tablet Take 1 tablet (20 mg total) by mouth at bedtime. 30 tablet 3   fenofibrate 160 MG tablet TAKE 1 TABLET BY MOUTH EVERY DAY 90 tablet 3   fluticasone (FLONASE) 50 MCG/ACT nasal spray Place 2 sprays into both nostrils daily as needed for allergies. 48 mL 3   furosemide (LASIX) 40 MG tablet Take 1 tablet (40 mg total) by mouth daily.     gabapentin (NEURONTIN) 300 MG capsule Take at night in addition  to the '100mg'$  gabapentin to get '400mg'$  at night and '100mg'$  in the morning and at mid day. 90 capsule 3   glucose blood (CONTOUR NEXT TEST) test strip 1 each by Other route 4 (four) times daily. And lancets 4/day 400 each 3   insulin aspart (NOVOLOG FLEXPEN) 100 UNIT/ML FlexPen Inject 15 Units into the skin daily with supper. 15 mL 11   insulin glargine (LANTUS SOLOSTAR) 100 UNIT/ML Solostar Pen Inject 16 Units into the skin  every morning. (Patient taking differently: Inject 18 Units into the skin every morning.) 30 mL 2   Insulin Pen Needle (PEN NEEDLES) 32G X 4 MM MISC 1 each by Does not apply route 4 (four) times daily. E11.9 400 each 0   Lancet Devices (EASY MINI EJECT LANCING DEVICE) MISC Use as directed 4 times daily to test blood sugar 1 each 3   LINZESS 290 MCG CAPS capsule TAKE 1 CAPSULE BY MOUTH DAILY BEFORE BREAKFAST. (Patient taking differently: Take 290 mcg by mouth daily as needed (IBS symptoms).) 30 capsule 3   meloxicam (MOBIC) 15 MG tablet Take 1 tablet (15 mg total) by mouth daily. (Patient taking differently: Take 15 mg by mouth daily as needed for pain.) 30 tablet 1   metFORMIN (GLUCOPHAGE-XR) 500 MG 24 hr tablet Take 4 tablets (2,000 mg total) by mouth daily with breakfast. 360 tablet 3   Microlet Lancets MISC 1 each by Does not apply route 2 (two) times daily. E11.9 100 each 2   ondansetron (ZOFRAN-ODT) 4 MG disintegrating tablet Take 1 tablet (4 mg total) by mouth every 8 (eight) hours as needed for nausea or vomiting. 20 tablet 0   potassium chloride SA (KLOR-CON M20) 20 MEQ tablet Take 1.5 tablets (30 mEq total) by mouth daily.     telmisartan (MICARDIS) 20 MG tablet TAKE 1 TABLET BY MOUTH EVERY DAY 90 tablet 1   venlafaxine XR (EFFEXOR-XR) 75 MG 24 hr capsule TAKE 1 CAPSULE BY MOUTH TWICE A DAY 180 capsule 3   No current facility-administered medications for this visit.     Objective:  BP 138/64   Pulse 80   Temp 98.2 F (36.8 C)   Ht '5\' 6"'$  (1.676 m)   Wt 219 lb 3.2 oz  (99.4 kg)   SpO2 97%   BMI 35.38 kg/m  Gen: NAD, resting comfortably CV: RRR no murmurs rubs or gallops Lungs: CTAB no crackles, wheeze, rhonchi Ext: no edema Skin: warm, dry     Assessment and Plan   #Hospital follow-up-pulmonary sarcoidosis, bronchitis S:Patient was hospitalized from 06/05/2022 to 06/09/2022 after presenting with 3 weeks of nonproductive cough and dyspnea related to pulmonary sarcoidosis-listed on discharge summary as COPD with exacerbation.  She tested negative for COVID but there was concern with husband testing positive in late August that this may have been the underlying diagnosis.  She appears to be responsive to steroids.  There was concern for vocal cord dysfunction with stridor (stridor seemed to improve on its own) but this was ruled out by ENT consultation on June 06, 2022. Continued on her baseline breo.   There was some concern that reflux may not be well controlled a pulmonary with consideration of outpatient GI follow-up-patient does have an Nissen fundoplication but concern for potential failure.  Outpatient pulmonology follow-up was planned with Dr. Vaughan Browner.    She had an updated echocardiogram due to Diastolic CHF-EF preserved at 55% and overall appears well compensated  Her CKD stage III actually improved during hospital stay-diuresis was held and was planned to be restarted 3 days after discharge  Today does report has some lingering cough even after all her treatments. Not productive. Scheduled 07/09/22 for follow up with pulmonology. She is ok with GI referral given concern of nissen fundoplication failure. Cough is improving but nighttime cough still bothersome. She finished prednisone- she ran out of old tussionex A/P: Pulmonary sarcoidosis/COPD/underlying asthma-possibly all flared by COVID given positive test in husband at home-still has some lingering cough and needs  a refill on Tussionex to help her sleep at night which was provided.  She knows if  she has any worsening but I would like for her to touch base with pulmonary and she has an appointment on the 29th. Continue breo  She has been having some reflux despite prior Niesen fundoplication and we placed a referral back to gastroenterology Dr. Davis Gourd was concerned about failure as well.    # hypertension/hypertensive heart disease with chronic diastolic CHF/CKD stage III S: compliant with Lasix 40 mg daily (restarted yesterday), diltiazem extended release 120 mg, carvedilol 25 mg BID  Renal function stable with GFR of 40s or 50s A/P: HTN- Blood pressure is high normal but just restarted Lasix yesterday-she will continue current medications for now-Home readings mostly in the 130s but occasionally 140s and hopefully will improve back on the Lasix-we will go ahead and update her renal function to do a recheck posthospitalization as well as a CBC  Chronic kidney disease stage III appears stable in the hospital-was actually improving during course  #Type 2 diabetes mellitus/morbid obesity S: follows with endocrinology and on insulin.  Sugar was higher during hospitalization- she states got as high as 400 CBG- improving at home Lab Results  Component Value Date   HGBA1C 8.4 (H) 04/29/2022   HGBA1C 8.8 (A) 02/02/2022   HGBA1C 8.7 (A) 12/03/2021  A/P:A1c mildly improved and has follow up with Dr. Dwyane Dee- enocucaged her to keep this   #Gerd- on high dose pantoprazole 40 mg but having breakthrough. Had been on dexilant in the past and better control- wants to retrial with new insurance - wonder if nighttime cough is more reflux related as well- hoping this change helps  Recommended follow up: Return for next already scheduled visit or sooner if needed. Future Appointments  Date Time Provider [place]  07/09/2022  9:15 AM [facility] None  07/12/2022  9:30 AM Marshell Garfinkel, MD [facility] None  07/13/2022 10:15 AM Val[facility] Piccolo Barnabas Lister, RD [facility] NDM   07/14/2022  9:45 AM Elayne Snare, MD [facility] None  02/28/2023  9:00 AM [name], MD [facility]   Lab/Order associations:   ICD-10-CM   1. COPD with acute exacerbation ([place])  J44.1     2. Sarcoidosis of lung ([place])  D86.0     3. Hypertensive heart disease with chronic diastolic congestive heart failure (HCC)  I11.0 CBC with Differential/Platelet   I50.32 Comprehensive metabolic panel    4. Type 2 diabetes mellitus with stage 3a chronic kidney disease, with long-term current use of insulin (H[facility])  E11.22    N18.31    Z79.4     5. Need for immunization against influenza  Z23 Flu Vaccine QUAD 65moIM (Fluarix, Fluzone & Alfiuria Quad PF)    6. Gastroesophageal reflux disease, unspecified whether esophagitis present  K21.9 Ambulatory referral to Gastroenterology    CBC with Differential/Platelet    Comprehensive metabolic panel    7. History of Nissen fundoplication  ZQ73.419Ambulatory referral to Gastroenterology      Meds ordered this encounter  Medications   chlorpheniramine-HYDROcodone (TUSSIONEX) 10-8 MG/5ML    Sig: Take 5 mLs by mouth at bedtime as needed for cough (do not drive for 8 hours after taking).    Dispense:  70 mL    Refill:  0   dexlansoprazole (DEXILANT) 60 MG capsule    Sig: Take 1 capsule (60 mg total) by mouth daily.    Dispense:  30 capsule    Refill:  11  Patient failed pantoprazole 40 mg- prior controlled on dexilant and would like to restart. May trial aciphex if this is not covered even with prior failure    Return precautions advised.  Garret Reddish, MD

## 2022-06-15 NOTE — Patient Instructions (Addendum)
[facility] contact Please call to schedule visit and/or procedure Address: [address] [place], [place] [address] Phone: [phone]   See if we can get you back on dexilant 60 mg- if not covered let me know and will trial aciphex  Please stop by lab before you go If you have mychart- we will send your results within 3 business days of [place] receiving them.  If you do not have mychart- we will call you about results within 5 business days of [place] receiving them.  *please also note that you will see labs on mychart as soon as they post. I will later go in and write notes on them- will say "notes from Dr. Yong Channel"   Recommended follow up: Return for next already scheduled visit or sooner if needed.

## 2022-06-16 LAB — CBC WITH DIFFERENTIAL/PLATELET
Basophils Absolute: 0.1 10*3/uL (ref 0.0–0.1)
Basophils Relative: 0.9 % (ref 0.0–3.0)
Eosinophils Absolute: 0.5 10*3/uL (ref 0.0–0.7)
Eosinophils Relative: 4.1 % (ref 0.0–5.0)
HCT: 33.7 % — ABNORMAL LOW (ref 36.0–46.0)
Hemoglobin: 11.2 g/dL — ABNORMAL LOW (ref 12.0–15.0)
Lymphocytes Relative: 23.6 % (ref 12.0–46.0)
Lymphs Abs: 2.6 10*3/uL (ref 0.7–4.0)
MCHC: 33.2 g/dL (ref 30.0–36.0)
MCV: 92 fl (ref 78.0–100.0)
Monocytes Absolute: 0.8 10*3/uL (ref 0.1–1.0)
Monocytes Relative: 6.9 % (ref 3.0–12.0)
Neutro Abs: 7.2 10*3/uL (ref 1.4–7.7)
Neutrophils Relative %: 64.5 % (ref 43.0–77.0)
Platelets: 321 10*3/uL (ref 150.0–400.0)
RBC: 3.67 Mil/uL — ABNORMAL LOW (ref 3.87–5.11)
RDW: 13.9 % (ref 11.5–15.5)
WBC: 11.2 10*3/uL — ABNORMAL HIGH (ref 4.0–10.5)

## 2022-06-18 NOTE — Progress Notes (Unsigned)
   I, Peterson Lombard, LAT, ATC acting as a scribe for [name], MD.  [name] is a 61 y.o. female who presents to [place] at [facility] today for f/u of L wrist pain and L CTS after suffering a fall on 03/26/22.  She was last seen by Dr. Georgina Snell on 04/21/22 and was give an L 1st [place] steroid injection and was advised OK to start advancing activity as tolerated. Today, pt reports  Dx imaging: 04/19/22 L wrist MRI arthrogram  04/07/22 L wrist XR  03/29/22 L hand XR  Pertinent review of systems: ***  Relevant historical information: ***   Exam:  There were no vitals taken for this visit. General: Well Developed, well nourished, and in no acute distress.   MSK: ***    Lab and Radiology Results No results found. However, due to the size of the patient record, not all encounters were searched. Please check Results Review for a complete set of results. No results found.     Assessment and Plan: 61 y.o. female with ***   PDMP not reviewed this encounter. No orders of the defined types were placed in this encounter.  No orders of the defined types were placed in this encounter.    Discussed warning signs or symptoms. Please see discharge instructions. Patient expresses understanding.   ***

## 2022-06-21 ENCOUNTER — Ambulatory Visit (INDEPENDENT_AMBULATORY_CARE_PROVIDER_SITE_OTHER): Payer: BC Managed Care – PPO | Admitting: Family Medicine

## 2022-06-21 ENCOUNTER — Other Ambulatory Visit: Payer: Self-pay | Admitting: Family Medicine

## 2022-06-21 VITALS — BP 152/76 | HR 89 | Ht 66.0 in | Wt 221.8 lb

## 2022-06-21 DIAGNOSIS — M79645 Pain in left finger(s): Secondary | ICD-10-CM

## 2022-06-21 DIAGNOSIS — Z1231 Encounter for screening mammogram for malignant neoplasm of breast: Secondary | ICD-10-CM

## 2022-06-21 NOTE — Patient Instructions (Signed)
Thank you for coming in today.   We can do the injection again at the 3 month mark which would be around October 12.   Check back as needed.   No need to do the injection if your do not hurt enough to do the shot.

## 2022-06-23 ENCOUNTER — Ambulatory Visit (INDEPENDENT_AMBULATORY_CARE_PROVIDER_SITE_OTHER): Payer: BC Managed Care – PPO | Admitting: Family Medicine

## 2022-06-23 ENCOUNTER — Encounter: Payer: Self-pay | Admitting: Family Medicine

## 2022-06-23 VITALS — BP 130/60 | HR 88 | Temp 97.8°F | Ht 66.0 in | Wt 221.2 lb

## 2022-06-23 DIAGNOSIS — I10 Essential (primary) hypertension: Secondary | ICD-10-CM

## 2022-06-23 DIAGNOSIS — J329 Chronic sinusitis, unspecified: Secondary | ICD-10-CM | POA: Diagnosis not present

## 2022-06-23 DIAGNOSIS — J4550 Severe persistent asthma, uncomplicated: Secondary | ICD-10-CM | POA: Diagnosis not present

## 2022-06-23 DIAGNOSIS — R059 Cough, unspecified: Secondary | ICD-10-CM

## 2022-06-23 DIAGNOSIS — B9689 Other specified bacterial agents as the cause of diseases classified elsewhere: Secondary | ICD-10-CM

## 2022-06-23 LAB — POC COVID19 BINAXNOW: SARS Coronavirus 2 Ag: NEGATIVE

## 2022-06-23 MED ORDER — AMOXICILLIN-POT CLAVULANATE 875-125 MG PO TABS: 1.0000 | ORAL_TABLET | Freq: Two times a day (BID) | ORAL | 0 refills | Status: DC

## 2022-06-23 NOTE — Progress Notes (Signed)
Phone [phone] In person visit   Subjective:   [name] is a 61 y.o. year old very pleasant female patient who presents for/with See problem oriented charting Chief Complaint  Patient presents with   Cough    Pt c/o productive cough with productive yellow mucous along with sinus pressure.   Past Medical History-  Patient Active Problem List   Diagnosis Date Noted   Severe persistent asthma 01/04/2019    Priority: High   Subcortical microvascular ischemic occlusive disease 07/12/2018    Priority: High   COPD with acute exacerbation ([place]) 09/18/2013    Priority: High   Chest pain 04/11/2013    Priority: High   Hypertensive heart disease with chronic diastolic congestive heart failure ([place])     Priority: High   Sarcoidosis of lung ([place]) 04/10/2007    Priority: High   Type II diabetes mellitus with renal manifestations ([place]) 08/21/2006    Priority: High   Hypertriglyceridemia 04/02/2020    Priority: Medium    Aortic atherosclerosis ([place]) 02/14/2020    Priority: Medium    Constipation 04/21/2018    Priority: Medium    Epiglottitis     Priority: Medium    Osteoarthritis of right knee 01/26/2017    Priority: Medium    CKD (chronic kidney disease) stage 3, GFR 30-59 ml/min ([place]) 04/27/2015    Priority: Medium    History of osteomyelitis 03/27/2015    Priority: Medium    Fatty liver 09/30/2014    Priority: Medium    Major depression in full remission ([place])     Priority: Medium    Gout 08/20/2010    Priority: Medium    Deficiency anemia 09/18/2009    Priority: Medium    Sleep apnea 04/21/2009    Priority: Medium    Hyperlipidemia associated with type 2 diabetes mellitus ([place]) 08/21/2006    Priority: Medium    Hypertension associated with diabetes ([place]) 08/21/2006    Priority: Medium    Nontraumatic complete tear of left rotator cuff     Priority: Low   Diabetic neuropathy ([place]) 02/20/2019    Priority: Low   Rapid palpitations 07/12/2018    Priority:  Low   Impingement of left ankle joint 06/26/2018    Priority: Low   Total knee replacement status, left 12/07/2017    Priority: Low   Dysphagia 09/20/2017    Priority: Low   Plantar fasciitis, right 07/13/2017    Priority: Low   S/P total knee arthroplasty, right 06/29/2017    Priority: Low   Osteoarthrosis, localized, primary, knee, right     Priority: Low   Sprain of calcaneofibular ligament of right ankle 06/06/2017    Priority: Low   Onychomycosis 10/27/2015    Priority: Low   MRSA (methicillin resistant staph aureus) culture positive 03/27/2015    Priority: Low   Hot flashes 07/15/2014    Priority: Low   Abnormal SPEP 04/17/2014    Priority: Low   Fracture of left leg 04/17/2014    Priority: Low   Internal hemorrhoids     Priority: Low   Preoperative clearance 03/25/2014    Priority: Low   Solitary pulmonary nodule, on CT 02/2013 - stable over 2 years in 2015 02/20/2013    Priority: Low   GERD 08/21/2006    Priority: Low   Class 2 obesity 06/06/2022   Chronic pain of left wrist 04/19/2022   Preop cardiovascular exam 12/07/2021   Facet arthropathy, cervical 06/29/2021   Foraminal stenosis of cervical region  06/29/2021   Class II obesity 02/02/2021   Iron deficiency anemia 10/01/2020   Postural dizziness with presyncope 09/30/2020   Chronic diastolic CHF (congestive heart failure) ([place]) 08/13/2017   Pulmonary sarcoidosis ([place])    Uncontrolled type 2 diabetes mellitus with hyperglycemia ([place]) 08/21/2006    Medications- reviewed and updated Current Outpatient Medications  Medication Sig Dispense Refill   albuterol (PROVENTIL HFA;VENTOLIN HFA) 108 (90 Base) MCG/ACT inhaler Inhale 1-2 puffs into the lungs every 6 (six) hours as needed for wheezing or shortness of breath. 8 g 5   albuterol (PROVENTIL) (2.5 MG/3ML) 0.083% nebulizer solution Take 2.5 mg by nebulization every 6 (six) hours as needed for wheezing or shortness of breath.     allopurinol (ZYLOPRIM) 300 MG  tablet TAKE 1 TABLET BY MOUTH EVERY DAY 90 tablet 0   amoxicillin-clavulanate (AUGMENTIN) 875-125 MG tablet Take 1 tablet by mouth 2 (two) times daily. 20 tablet 0   aspirin EC 81 MG tablet Take 1 tablet (81 mg total) by mouth daily. 30 tablet 11   atorvastatin (LIPITOR) 40 MG tablet TAKE 1 TABLET BY MOUTH EVERY DAY 90 tablet 3   benzonatate (TESSALON PERLES) 100 MG capsule Take 2 capsules (200 mg total) by mouth 3 (three) times daily as needed for cough. 20 capsule 0   BREO ELLIPTA 200-25 MCG/INH AEPB TAKE 1 PUFF BY MOUTH EVERY DAY (Patient taking differently: Inhale 1 puff into the lungs daily.) 60 each 5   buPROPion (WELLBUTRIN XL) 300 MG 24 hr tablet TAKE 1 TABLET BY MOUTH EVERY DAY 90 tablet 3   carvedilol (COREG) 25 MG tablet Take 1 tablet by mouth daily.     chlorpheniramine-HYDROcodone (TUSSIONEX) 10-8 MG/5ML Take 5 mLs by mouth at bedtime as needed for cough (do not drive for 8 hours after taking). 70 mL 0   Cholecalciferol 25 MCG (1000 UT) capsule Take 1,000 Units by mouth in the morning and at bedtime.     Continuous Blood Gluc Sensor (FREESTYLE LIBRE 3 SENSOR) MISC 1 Device by Does not apply route every 14 (fourteen) days. Apply 1 sensor on upper arm every 14 days for continuous glucose monitoring 2 each 2   dexlansoprazole (DEXILANT) 60 MG capsule Take 1 capsule (60 mg total) by mouth daily. 30 capsule 11   diclofenac Sodium (VOLTAREN) 1 % GEL Apply 1 application topically as needed (pain.).      diltiazem (CARDIZEM CD) 120 MG 24 hr capsule TAKE 1 CAPSULE BY MOUTH EVERY DAY 90 capsule 3   Dulaglutide (TRULICITY) 4.5 WN/0.2VO SOPN Inject 4.5 mg as directed once a week. 6 mL 3   famotidine (PEPCID) 20 MG tablet Take 1 tablet (20 mg total) by mouth at bedtime. 30 tablet 3   fenofibrate 160 MG tablet TAKE 1 TABLET BY MOUTH EVERY DAY 90 tablet 3   fluticasone (FLONASE) 50 MCG/ACT nasal spray Place 2 sprays into both nostrils daily as needed for allergies. 48 mL 3   furosemide (LASIX) 40 MG  tablet Take 1 tablet (40 mg total) by mouth daily.     gabapentin (NEURONTIN) 300 MG capsule Take at night in addition to the '100mg'$  gabapentin to get '400mg'$  at night and '100mg'$  in the morning and at mid day. 90 capsule 3   glucose blood (CONTOUR NEXT TEST) test strip 1 each by Other route 4 (four) times daily. And lancets 4/day 400 each 3   insulin aspart (NOVOLOG FLEXPEN) 100 UNIT/ML FlexPen Inject 15 Units into the skin daily with supper. 15 mL  11   insulin glargine (LANTUS SOLOSTAR) 100 UNIT/ML Solostar Pen Inject 16 Units into the skin every morning. (Patient taking differently: Inject 18 Units into the skin every morning.) 30 mL 2   Insulin Pen Needle (PEN NEEDLES) 32G X 4 MM MISC 1 each by Does not apply route 4 (four) times daily. E11.9 400 each 0   Lancet Devices (EASY MINI EJECT LANCING DEVICE) MISC Use as directed 4 times daily to test blood sugar 1 each 3   LINZESS 290 MCG CAPS capsule TAKE 1 CAPSULE BY MOUTH DAILY BEFORE BREAKFAST. (Patient taking differently: Take 290 mcg by mouth daily as needed (IBS symptoms).) 30 capsule 3   meloxicam (MOBIC) 15 MG tablet Take 1 tablet (15 mg total) by mouth daily. (Patient taking differently: Take 15 mg by mouth daily as needed for pain.) 30 tablet 1   metFORMIN (GLUCOPHAGE-XR) 500 MG 24 hr tablet Take 4 tablets (2,000 mg total) by mouth daily with breakfast. 360 tablet 3   Microlet Lancets MISC 1 each by Does not apply route 2 (two) times daily. E11.9 100 each 2   ondansetron (ZOFRAN-ODT) 4 MG disintegrating tablet Take 1 tablet (4 mg total) by mouth every 8 (eight) hours as needed for nausea or vomiting. 20 tablet 0   potassium chloride SA (KLOR-CON M20) 20 MEQ tablet Take 1.5 tablets (30 mEq total) by mouth daily.     telmisartan (MICARDIS) 20 MG tablet TAKE 1 TABLET BY MOUTH EVERY DAY 90 tablet 1   venlafaxine XR (EFFEXOR-XR) 75 MG 24 hr capsule TAKE 1 CAPSULE BY MOUTH TWICE A DAY 180 capsule 3   No current facility-administered medications for  this visit.     Objective:  BP 130/60   Pulse 88   Temp 97.8 F (36.6 C)   Ht '5\' 6"'$  (1.676 m)   Wt 221 lb 3.2 oz (100.3 kg)   SpO2 98%   BMI 35.70 kg/m  Gen: NAD, resting comfortably Mild frontal sinus tenderness, significant bilateral maxillary sinus tenderness, nasal turbinates erythematous and swollen but without active discharge, oropharynx largely normal other than some drainage noted from the sinuses CV: RRR no murmurs rubs or gallops Lungs: CTAB no crackles, wheeze, rhonchi Ext: no edema Skin: warm, dry  Results for orders placed or performed in visit on 06/23/22 (from the past 24 hour(s))  POC COVID-19     Status: None   Collection Time: 06/23/22 11:10 AM  Result Value Ref Range   SARS Coronavirus 2 Ag Negative Negative   *Note: Due to a large number of results and/or encounters for the requested time period, some results have not been displayed. A complete set of results can be found in Results Review.      Assessment and Plan   #Sinus pressure/productive cough S: Patient recently hospitalized for pulmonary sarcoidosis/COPD/underlying asthma all exacerbated by COVID-at that point she had some lingering cough and we refilled her Tussionex but we discussed if failure to improve to follow-up.  She has continued her Breo. No ear pressure.  Today she reports symptoms were improving slightly up to 3-4 days ago at which point they started to worsen-describes productive cough with yellow sputum as well as significant sinus pressure over last 3-4 days (so far has not blown out yellow discharge- feels socked in) -history of sinus infections.  She did have azithromycin last month as well as Augmentin in July for sinusitis-10 days at that point.  - no shortness of breath or wheeze yet - does not do  well with prednisone- raised sugar levels A/P: sinus pressure/productive cough that are worsening over 3-4 days and never fully recovered from prior illness/hospitalization- this gives  [place] lower threshold to start antibiotics for possible underlying bacterial component- in august she received a z-pack but with sinus pressure portion I told her I favored augmentin and 10 day course- can stop if symptoms improve -covid test negative today - with baseline asthma (severe persistent)/sarcoidosis- appears table for now- continue breo. No increased albuterol at this point -she has tolerated augmentin despite listed cephalosporin allergy - [place]- certainly hope she is feeling better prior ot trip  # hypertension S: compliant with Lasix 40 mg daily, diltiazem extended release 120 mg, carvedilol 25 mg BID,  telmisartan 20 mg BP Readings from Last 3 Encounters:  06/23/22 130/60  06/21/22 (!) 152/76  06/15/22 138/64  A/P: blood pressure slightly high 2 days ago with sports medicine but looks better today- continue current meds   Recommended follow up: Return for as needed for new, worsening, persistent symptoms. Future Appointments  Date Time Provider [place]  07/09/2022  3:20 PM [facility] MM 2 [facility]  07/12/2022  9:30 AM Marshell Garfinkel, MD [facility]UL[facility] None  07/13/2022 10:15 AM Valora Piccolo Barnabas Lister, RD [place] [facility]  09/07/2022 11:20 AM Irene Shipper, MD [facility]  10/12/2022  9:45 AM [facility] LAB [facility] None  10/14/2022  9:45 AM Elayne Snare, MD [facility] None  02/28/2023  9:00 AM [name], MD [facility] P[facility]    Lab/Order associations:   ICD-10-CM   1. Bacterial sinusitis  J32.9    B96.89     2. Essential hypertension  I10     3. Severe persistent asthma without complication  O12.24     4. Cough, unspecified type  R05.9 POC COVID-19     Meds ordered this encounter  Medications   amoxicillin-clavulanate (AUGMENTIN) 875-125 MG tablet    Sig: Take 1 tablet by mouth 2 (two) times daily.    Dispense:  20 tablet    Refill:  0    Return precautions advised.  Garret Reddish, MD

## 2022-06-23 NOTE — Patient Instructions (Addendum)
Due to never fully recovering from last illness and now worsening sinus pressure- lower threshold to treat with antibiotic- start augmentin for 10 days- hope you improve within 48 hours- see [place] back if symptoms worsen or fail to improve  Recommended follow up: Return for as needed for new, worsening, persistent symptoms.

## 2022-06-25 DIAGNOSIS — Z4789 Encounter for other orthopedic aftercare: Secondary | ICD-10-CM | POA: Diagnosis not present

## 2022-07-01 ENCOUNTER — Inpatient Hospital Stay: Payer: BC Managed Care – PPO | Admitting: Pulmonary Disease

## 2022-07-08 DIAGNOSIS — Z01419 Encounter for gynecological examination (general) (routine) without abnormal findings: Secondary | ICD-10-CM | POA: Diagnosis not present

## 2022-07-08 LAB — HM PAP SMEAR

## 2022-07-09 ENCOUNTER — Other Ambulatory Visit: Payer: BC Managed Care – PPO

## 2022-07-09 ENCOUNTER — Ambulatory Visit
Admission: RE | Admit: 2022-07-09 | Discharge: 2022-07-09 | Disposition: A | Discharge status: Home / Self Care | Payer: BC Managed Care – PPO | Source: Ambulatory Visit | Attending: Family Medicine | Admitting: Family Medicine

## 2022-07-09 DIAGNOSIS — Z1231 Encounter for screening mammogram for malignant neoplasm of breast: Secondary | ICD-10-CM

## 2022-07-10 ENCOUNTER — Other Ambulatory Visit: Payer: Self-pay | Admitting: Physician Assistant

## 2022-07-10 ENCOUNTER — Other Ambulatory Visit: Payer: Self-pay | Admitting: Family Medicine

## 2022-07-12 ENCOUNTER — Encounter: Payer: Self-pay | Admitting: Pulmonary Disease

## 2022-07-12 ENCOUNTER — Ambulatory Visit (INDEPENDENT_AMBULATORY_CARE_PROVIDER_SITE_OTHER): Payer: BC Managed Care – PPO | Admitting: Pulmonary Disease

## 2022-07-12 VITALS — BP 132/58 | HR 87 | Temp 98.2°F | Ht 66.0 in | Wt 220.0 lb

## 2022-07-12 DIAGNOSIS — G4730 Sleep apnea, unspecified: Secondary | ICD-10-CM

## 2022-07-12 DIAGNOSIS — J4540 Moderate persistent asthma, uncomplicated: Secondary | ICD-10-CM

## 2022-07-12 DIAGNOSIS — R0602 Shortness of breath: Secondary | ICD-10-CM

## 2022-07-12 NOTE — Patient Instructions (Signed)
I am glad you are improved after your recent hospitalization Will order PFTs and home sleep study Continue the Breo inhaler Please follow-up with your GI doctor as scheduled Return to clinic in 3 months.

## 2022-07-12 NOTE — Progress Notes (Signed)
[name]    [id number]    Jan 21, 1961  Primary Care Physician:Hunter, Brayton Mars, MD  Referring Physician: [name], MD [address] [place],  [place] [address]  Chief complaint: Consult for sarcoidosis follow-up, asthma, pulmonary hypertension evaluation  HPI: 61 y.o. who has history of hypertension, asthma, chronic bronchitis, sarcoidosis, diastolic CHF, GERD, diabetes, vocal cord dysfunction.  She has history of sarcoidosis diagnosed with mediastinoscopy in 2008.  Used to follow with Dr. Casper Harrison in [facility] with regular biannual follow-ups. She had been on high doses of prednisone. This has been slowly tapered to 5 mg a day.  Prior CT scans did not show any obvious pulmonary parenchymal involvement with sarcoidosis.  Her sarcoidosis is believed to have an airway component as well since she has recurrent attacks of bronchitis. She is also diagnosed with vocal cord dysfunction. She has taken off prednisone 21 after she had a femur fracture with no flareup of symptoms.  Last seen in pulmonary clinic in 2018 and she has not kept follow-up at [facility] since 2022  Hospitalized in August 2023 with nonproductive cough, dyspnea.  Noted to have significant wheezing and given a diagnosis COPD with exacerbation.  Tested negative for COVID though her husband was positive prior to admission.  PCCM was consulted.  She also saw ENT for evaluation of vocal cord dysfunction with fiberoptic evaluation showing normal vocal cord movement.  There is also question of enlarged PA on CT imaging however echocardiogram did not show significant right-sided dysfunction or pulmonary hypertension.  Hospital records, imaging and notes from 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  No mold, hot tub, Jacuzzi.  No feather pillows or comforters Smoking history: Never smoker Travel history: No  significant travel history Relevant family history: Uncle had sarcoidosis.  Outpatient Encounter Medications as of 07/12/2022  Medication Sig   albuterol (PROVENTIL HFA;VENTOLIN HFA) 108 (90 Base) MCG/ACT inhaler Inhale 1-2 puffs into the lungs every 6 (six) hours as needed for wheezing or shortness of breath.   albuterol (PROVENTIL) (2.5 MG/3ML) 0.083% nebulizer solution Take 2.5 mg by nebulization every 6 (six) hours as needed for wheezing or shortness of breath.   allopurinol (ZYLOPRIM) 300 MG tablet TAKE 1 TABLET BY MOUTH EVERY DAY   aspirin EC 81 MG tablet Take 1 tablet (81 mg total) by mouth daily.   atorvastatin (LIPITOR) 40 MG tablet TAKE 1 TABLET BY MOUTH EVERY DAY   benzonatate (TESSALON PERLES) 100 MG capsule Take 2 capsules (200 mg total) by mouth 3 (three) times daily as needed for cough.   BREO ELLIPTA 200-25 MCG/INH AEPB TAKE 1 PUFF BY MOUTH EVERY DAY (Patient taking differently: Inhale 1 puff into the lungs daily.)   buPROPion (WELLBUTRIN XL) 300 MG 24 hr tablet TAKE 1 TABLET BY MOUTH EVERY DAY   carvedilol (COREG) 25 MG tablet Take 1 tablet by mouth daily.   chlorpheniramine-HYDROcodone (TUSSIONEX) 10-8 MG/5ML Take 5 mLs by mouth at bedtime as needed for cough (do not drive for 8 hours after taking).   Cholecalciferol 25 MCG (1000 UT) capsule Take 1,000 Units by mouth in the morning and at bedtime.   Continuous Blood Gluc Sensor (FREESTYLE LIBRE 3 SENSOR) MISC 1 Device by Does not apply route every 14 (fourteen) days. Apply 1 sensor on upper arm every 14 days for continuous glucose monitoring   dexlansoprazole (DEXILANT) 60 MG  capsule Take 1 capsule (60 mg total) by mouth daily.   diclofenac Sodium (VOLTAREN) 1 % GEL Apply 1 application topically as needed (pain.).    diltiazem (CARDIZEM CD) 120 MG 24 hr capsule TAKE 1 CAPSULE BY MOUTH EVERY DAY   Dulaglutide (TRULICITY) 4.5 PN/3.6RW SOPN Inject 4.5 mg as directed once a week.   famotidine (PEPCID) 20 MG tablet Take 1 tablet (20  mg total) by mouth at bedtime.   fenofibrate 160 MG tablet TAKE 1 TABLET BY MOUTH EVERY DAY   fluticasone (FLONASE) 50 MCG/ACT nasal spray Place 2 sprays into both nostrils daily as needed for allergies.   furosemide (LASIX) 40 MG tablet Take 1 tablet (40 mg total) by mouth daily.   gabapentin (NEURONTIN) 300 MG capsule Take at night in addition to the '100mg'$  gabapentin to get '400mg'$  at night and '100mg'$  in the morning and at mid day.   glucose blood (CONTOUR NEXT TEST) test strip 1 each by Other route 4 (four) times daily. And lancets 4/day   insulin aspart (NOVOLOG FLEXPEN) 100 UNIT/ML FlexPen Inject 15 Units into the skin daily with supper.   insulin glargine (LANTUS SOLOSTAR) 100 UNIT/ML Solostar Pen Inject 16 Units into the skin every morning. (Patient taking differently: Inject 18 Units into the skin every morning.)   Insulin Pen Needle (PEN NEEDLES) 32G X 4 MM MISC 1 each by Does not apply route 4 (four) times daily. E11.9   Lancet Devices (EASY MINI EJECT LANCING DEVICE) MISC Use as directed 4 times daily to test blood sugar   LINZESS 290 MCG CAPS capsule TAKE 1 CAPSULE BY MOUTH DAILY BEFORE BREAKFAST. (Patient taking differently: Take 290 mcg by mouth daily as needed (IBS symptoms).)   meloxicam (MOBIC) 15 MG tablet Take 1 tablet (15 mg total) by mouth daily. (Patient taking differently: Take 15 mg by mouth daily as needed for pain.)   metFORMIN (GLUCOPHAGE-XR) 500 MG 24 hr tablet Take 4 tablets (2,000 mg total) by mouth daily with breakfast.   Microlet Lancets MISC 1 each by Does not apply route 2 (two) times daily. E11.9   ondansetron (ZOFRAN-ODT) 4 MG disintegrating tablet Take 1 tablet (4 mg total) by mouth every 8 (eight) hours as needed for nausea or vomiting.   potassium chloride SA (KLOR-CON M20) 20 MEQ tablet Take 1.5 tablets (30 mEq total) by mouth daily.   telmisartan (MICARDIS) 20 MG tablet TAKE 1 TABLET BY MOUTH EVERY DAY   venlafaxine XR (EFFEXOR-XR) 75 MG 24 hr capsule TAKE 1  CAPSULE BY MOUTH TWICE A DAY   [DISCONTINUED] amoxicillin-clavulanate (AUGMENTIN) 875-125 MG tablet Take 1 tablet by mouth 2 (two) times daily.   [DISCONTINUED] mupirocin nasal ointment (BACTROBAN) 2 % Place 1 application into the nose 2 (two) times daily. Use one-half of tube in each nostril twice daily for five (5) days. After application, press sides of nose together and gently massage.   No facility-administered encounter medications on file as of 07/12/2022.    Physical Exam: Blood pressure (!) 132/58, pulse 87, temperature 98.2 F (36.8 C), temperature source Oral, height '5\' 6"'$  (1.676 m), weight 220 lb (99.8 kg), SpO2 98 %. Gen:      No acute distress HEENT:  EOMI, sclera anicteric Neck:     No masses; no thyromegaly Lungs:    Clear to auscultation bilaterally; normal respiratory effort CV:         Regular rate and rhythm; no murmurs Abd:      + bowel sounds; soft, non-tender;  no palpable masses, no distension Ext:    No edema; adequate peripheral perfusion Skin:      Warm and dry; no rash Neuro: alert and oriented x 3 Psych: normal mood and affect  Data Reviewed: Imaging CT chest (07/03/13)- No CT findings to suggest sarcoidosis. A single tiny punctate indeterminate nodule is noted. The nodule's oval configuration makes an intrapulmonary lymph node or other benign entity more likely.   CTA 06/05/2022-no evidence of pulmonary embolism, cardiomegaly, enlarged pulmonary artery.  Lungs are clear. I have reviewed the images personally.  PFTs  02/12/16 FVC 2.12 (58%), FEV1 1.75 (1%), F/F 83, TLC 75%, DLCO 65% Moderate restriction and reduction in diffusion capacity. Improved compared to 2015   Labs: CBC 06/15/22 WBC 11.2, eos 4.1%, absolute eosinophil count 459  Surgical pathology, mediastinal lymph nodes 11/07/06 Nonnecrotizing granulomas.  Cardiac: Echocardiogram 06/08/2022-LVEF 78-58%, grade 1 diastolic dysfunction.  Normal RV size and function, small pericardial effusion.  No TR  noted.  Assessment:  Sarcoidosis Diagnosed with mediastinoscopy in 2008.  Had been on prednisone on past but off steroids since 2021. Recent CT reviewed with no significant pulmonary involvement of sarcoidosis.  It is previously felt that she had airway involvement of sarcoidosis leading to recurrent bronchitis  Chronic bronchitis Asthma/Airway component of sarcoidosis Continue Breo, albuterol as needed Repeat PFTs  Enlarged PA Noted on recent CT chest however echocardiogram is reassuring with normal RV size and function Order home sleep study for evaluation of sleep apnea.  She had previously been diagnosed with sleep apnea many years ago but is currently not on treatment.  Plan/Recommendations: Continue Breo, albuterol as needed PFTs, home sleep study.  Marshell Garfinkel MD [facility] 07/12/2022, 9:41 AM  CC: [name], MD

## 2022-07-13 ENCOUNTER — Ambulatory Visit: Payer: BC Managed Care – PPO | Admitting: Dietician

## 2022-07-13 ENCOUNTER — Ambulatory Visit: Payer: Self-pay

## 2022-07-13 ENCOUNTER — Ambulatory Visit (INDEPENDENT_AMBULATORY_CARE_PROVIDER_SITE_OTHER): Payer: BC Managed Care – PPO | Admitting: Family Medicine

## 2022-07-13 VITALS — BP 152/74 | HR 94 | Ht 66.0 in | Wt 219.0 lb

## 2022-07-13 DIAGNOSIS — M79645 Pain in left finger(s): Secondary | ICD-10-CM | POA: Diagnosis not present

## 2022-07-13 NOTE — Progress Notes (Deleted)
   I, Peterson Lombard, LAT, ATC acting as a scribe for [name], MD.  [name] is a 61 y.o. female who presents to [place] [facility] today for f/u L hand pain predominantly due to first [facility] DJD. On 04/21/22, pt was give an L 1st [facility] steroid injection. Pt was last seen by Dr. Georgina Snell on 06/21/22 and was advised we could repeat the steroid injection every 3 months if needed. Today, pt reports  Dx imaging: 04/19/22 L wrist MRI arthrogram             04/07/22 L wrist XR             03/29/22 L hand XR   Pertinent review of systems: ***  Relevant historical information: ***   Exam:  There were no vitals taken for this visit. General: Well Developed, well nourished, and in no acute distress.   MSK: ***    Lab and Radiology Results No results found. However, due to the size of the patient record, not all encounters were searched. Please check Results Review for a complete set of results. MM 3D SCREEN BREAST BILATERAL  Result Date: 07/12/2022 CLINICAL DATA:  Screening. EXAM: DIGITAL SCREENING BILATERAL MAMMOGRAM WITH TOMOSYNTHESIS AND CAD TECHNIQUE: Bilateral screening digital craniocaudal and mediolateral oblique mammograms were obtained. Bilateral screening digital breast tomosynthesis was performed. The images were evaluated with computer-aided detection. COMPARISON:  Previous exam(s). ACR Breast Density Category c: The breast tissue is heterogeneously dense, which may obscure small masses. FINDINGS: There are no findings suspicious for malignancy. IMPRESSION: No mammographic evidence of malignancy. A result letter of this screening mammogram will be mailed directly to the patient. RECOMMENDATION: Screening mammogram in one year. (Code:SM-B-01Y) BI-RADS CATEGORY  1: Negative. Electronically Signed   By: Abelardo Diesel M.D.   On: 07/12/2022 13:07       Assessment and Plan: 61 y.o. female with ***   PDMP not reviewed this encounter. No orders of the defined  types were placed in this encounter.  No orders of the defined types were placed in this encounter.    Discussed warning signs or symptoms. Please see discharge instructions. Patient expresses understanding.   ***

## 2022-07-13 NOTE — Progress Notes (Signed)
   I, Peterson Lombard, LAT, ATC acting as a scribe for [name], MD.  [name] is a 61 y.o. female who presents to [place] at [facility] today for f/u L hand pain predominantly due to first [facility] DJD. On 04/21/22, pt was give an L 1st [facility] steroid injection. Pt was last seen by Dr. Georgina Snell on 06/21/22 and was advised we could repeat the steroid injection every 3 months if needed. Today, pt reports L hand pain return around the end of Sept. Pt c/o difficulty trying to twist open jars/cans and states that it's very painful. Pt locates pain to the L 1st MC on the palmar aspect.   Dx imaging: 04/19/22 L wrist MRI arthrogram             04/07/22 L wrist XR             03/29/22 L hand XR  Pertinent review of systems: No fevers or chills  Relevant historical information: Hypertension and diabetes. Pulmonary sarcoidosis.  Exam:  BP (!) 152/74   Pulse 94   Ht '5\' 6"'$  (1.676 m)   Wt 219 lb (99.3 kg)   SpO2 98%   BMI 35.35 kg/m  General: Well Developed, well nourished, and in no acute distress.   MSK: Left thumb: Swollen at first [facility].  Tender palpation in this region. Decreased thumb motion.   Lab and Radiology Results  Procedure: Real-time Ultrasound Guided Injection of left first [facility] Device: Philips Affiniti 50G Images permanently stored and available for review in PACS Verbal informed consent obtained.  Discussed risks and benefits of procedure. Warned about infection, bleeding, hyperglycemia damage to structures among others. Patient expresses understanding and agreement Time-out conducted.   Noted no overlying erythema, induration, or other signs of local infection.   Skin prepped in a sterile fashion.   Local anesthesia: Topical Ethyl chloride.   With sterile technique and under real time ultrasound guidance: 40 mg of Kenalog and 1 mL of lidocaine injected into first [place]. Fluid seen entering the joint capsule.   Completed without difficulty   Pain immediately  resolved suggesting accurate placement of the medication.   Advised to call if fevers/chills, erythema, induration, drainage, or persistent bleeding.   Images permanently stored and available for review in the ultrasound unit.  Impression: Technically successful ultrasound guided injection.      Assessment and Plan: 61 y.o. female with left thumb DJD at first [facility].  Last injection was just about 3 months ago and has not lasted very long.  Plan for repeat injection today.  If this repeat injection does not last 3 months would recommend referral to orthopedic hand surgery for surgical consultation options.   PDMP not reviewed this encounter. Orders Placed This Encounter  Procedures   [place] LIMITED JOINT SPACE STRUCTURES UP LEFT(NO LINKED CHARGES)    Order Specific Question:   Reason for Exam (SYMPTOM  OR DIAGNOSIS REQUIRED)    Answer:   left hand pain    Order Specific Question:   Preferred imaging location?    Answer:   [place]   No orders of the defined types were placed in this encounter.    Discussed warning signs or symptoms. Please see discharge instructions. Patient expresses understanding.   The above documentation has been reviewed and is accurate and complete [name], M.D.

## 2022-07-13 NOTE — Patient Instructions (Addendum)
Thank you for coming in today.   You received an injection today. Seek immediate medical attention if the joint becomes red, extremely painful, or is oozing fluid.   Let me know if not better and I will refer you for a hand surgery consultation  Check back as needed

## 2022-07-14 ENCOUNTER — Ambulatory Visit: Payer: BC Managed Care – PPO | Admitting: Family Medicine

## 2022-07-14 ENCOUNTER — Ambulatory Visit: Payer: BC Managed Care – PPO | Admitting: Endocrinology

## 2022-07-23 ENCOUNTER — Ambulatory Visit (INDEPENDENT_AMBULATORY_CARE_PROVIDER_SITE_OTHER): Payer: BC Managed Care – PPO | Admitting: Physician Assistant

## 2022-07-23 ENCOUNTER — Encounter: Payer: Self-pay | Admitting: Physician Assistant

## 2022-07-23 VITALS — BP 136/83 | HR 107 | Temp 97.8°F | Ht 66.0 in | Wt 216.4 lb

## 2022-07-23 DIAGNOSIS — R300 Dysuria: Secondary | ICD-10-CM

## 2022-07-23 DIAGNOSIS — J029 Acute pharyngitis, unspecified: Secondary | ICD-10-CM | POA: Diagnosis not present

## 2022-07-23 DIAGNOSIS — R6889 Other general symptoms and signs: Secondary | ICD-10-CM

## 2022-07-23 LAB — POC URINALSYSI DIPSTICK (AUTOMATED)
Bilirubin, UA: NEGATIVE
Blood, UA: NEGATIVE
Glucose, UA: NEGATIVE
Ketones, UA: NEGATIVE
Nitrite, UA: NEGATIVE
Protein, UA: NEGATIVE
Spec Grav, UA: 1.015 (ref 1.010–1.025)
Urobilinogen, UA: 0.2 E.U./dL
pH, UA: 5.5 (ref 5.0–8.0)

## 2022-07-23 LAB — POCT INFLUENZA A/B

## 2022-07-23 LAB — POC COVID19 BINAXNOW: SARS Coronavirus 2 Ag: NEGATIVE

## 2022-07-23 LAB — POCT RAPID STREP A (OFFICE): Rapid Strep A Screen: POSITIVE — AB

## 2022-07-23 MED ORDER — IPRATROPIUM BROMIDE 0.06 % NA SOLN: 2.0000 | Freq: Four times a day (QID) | NASAL | 1 refills | Status: DC

## 2022-07-23 MED ORDER — HYDROCOD POLI-CHLORPHE POLI ER 10-8 MG/5ML PO SUER: 5.0000 mL | Freq: Every evening | ORAL | 0 refills | Status: DC | PRN

## 2022-07-23 MED ORDER — AMOXICILLIN 500 MG PO CAPS: 500.0000 mg | ORAL_CAPSULE | Freq: Two times a day (BID) | ORAL | 0 refills | Status: AC

## 2022-07-23 NOTE — Progress Notes (Signed)
[name] is a 61 y.o. female here for a follow up of a pre-existing problem.  History of Present Illness:   Chief Complaint  Patient presents with   Dizziness    Pt says she had Covid Vaccine and RSV Vaccine yesterday.    Fatigue    HPI  Flu-like symptoms She reports head congestion, nasal congestion, fatigue, bilateral ear pain, and BLE aches. Symptoms began this morning. He most bothersome symptoms are her headache and chest pain that occurs only with coughing. She received the RSV and flu vaccines yesterday. She developed a mild sore throat yesterday prior to vaccines. She has taken Tylenol without relief. Denies sick contacts. She has not needed her nebulizer or Albuterol with current illness. She uses Flonase daily and [employer].  UTI Symptoms For the past 3-4 days has had burning with urination, frequency Denies blood in urine, changes in stools Has very mild lower pelvic pain/pressure Denies fevers, chills, unusual back pain   Past Medical History:  Diagnosis Date   Abnormal SPEP 04/17/2014   Acute left ankle pain 01/26/2017   ANEMIA-UNSPECIFIED 09/18/2009   Anxiety    Arthritis    Blood transfusion without reported diagnosis    Cataract    CHF (congestive heart failure) (HCC)    Chronic diastolic heart failure, NYHA class 2 (HCC)    Normal LVEDP by May 2018   COPD (chronic obstructive pulmonary disease) ([place])    Depression    DIABETES MELLITUS, TYPE II 08/21/2006   Diabetic osteomyelitis ([place]) 05/29/2015   Fatty liver    Fracture of 5th metatarsal    non union   GERD 41/66/0630   Had fundoplication   GOUT 16/10/930   Hammer toe of right foot    2-5th toes   Hx of umbilical hernia repair    HYPERLIPIDEMIA 08/21/2006   HYPERTENSION 08/21/2006   Infection of wound due to methicillin resistant Staphylococcus aureus (MRSA)    Internal hemorrhoids    Kidney problem    Multiple allergies 10/14/2016   OBESITY 06/04/2009   Onychomycosis 10/27/2015    Osteomyelitis of left foot ([place]) 05/29/2015   Osteoporosis    Pulmonary sarcoidosis ([place])    Followed locally by pulmonology, but also by Dr. Casper Harrison at [facility]   Right knee pain 01/26/2017   Vocal cord dysfunction    Wears partial dentures      Social History   Tobacco Use   Smoking status: Never   Smokeless tobacco: Never  Vaping Use   Vaping Use: Never used  Substance Use Topics   Alcohol use: No    Alcohol/week: 0.0 standard drinks of alcohol   Drug use: No    Past Surgical History:  Procedure Laterality Date   ABDOMINAL HYSTERECTOMY     APPENDECTOMY     BLADDER SUSPENSION  11/11/2011   Procedure: TRANSVAGINAL TAPE (TVT) PROCEDURE;  Surgeon: Olga Millers, MD;  Location: [facility];  Service: Gynecology;  Laterality: N/A;   CAROTIDS  02/18/2011   CAROTID DUPLEX; VERTEBRALS ARE PATENT WITH ANTEGRADE FLOW. ICA/CCA RATIO 1.61 ON RIGHT AND 0.75 ON LEFT   CATARACT EXTRACTION, BILATERAL     CHOLECYSTECTOMY  1984   COLONOSCOPY  04/29/2010   [name]   CYSTOSCOPY  11/11/2011   Procedure: CYSTOSCOPY;  Surgeon: Olga Millers, MD;  Location: [facility];  Service: Gynecology;  Laterality: N/A;   EXTUBATION (ENDOTRACHEAL) IN OR N/A 09/23/2017   Procedure: EXTUBATION (ENDOTRACHEAL) IN OR;  Surgeon: Helayne Seminole, MD;  Location: [facility] [facility];  Service: ENT;  Laterality: N/A;   FIBEROPTIC LARYNGOSCOPY AND TRACHEOSCOPY N/A 09/23/2017   Procedure: FLEXIBLE FIBEROPTIC LARYNGOSCOPY;  Surgeon: Helayne Seminole, MD;  Location: [place];  Service: ENT;  Laterality: N/A;   FRACTURE SURGERY     foot   [place] Left 10/02/2018   Procedure: HALLUX ARTHRODESIS;  Surgeon: Edrick Kins, DPM;  Location: [facility];  Service: Podiatry;  Laterality: Left;   HAMMER TOE SURGERY Left 10/02/2018   Procedure: HAMMER TOE CORRECTION 2ND 3RD 4RD FIFTH TOE;  Surgeon: Edrick Kins, DPM;  Location: [facility];  Service: Podiatry;  Laterality: Left;   HAMMER TOE SURGERY Right 04/12/2019   Procedure:  HAMMER TOE CORRECTION, 2ND, 3RD, 4TH AND 5TH TOES OF RIGHT FOOT;  Surgeon: Edrick Kins, DPM;  Location: [place];  Service: Podiatry;  Laterality: Right;   HAMMER TOE SURGERY Left 10/25/2019   Procedure: HAMMER TOE REPAIR SECOND, THIRD, FOUTH;  Surgeon: Edrick Kins, DPM;  Location: [place] [facility];  Service: Podiatry;  Laterality: Left;   HERNIA REPAIR     I & D EXTREMITY Left 06/27/2015   Procedure: Partial Excision Left Calcaneus, Place Antibiotic Beads, and Wound VAC;  Surgeon: Newt Minion, MD;  Location: [place];  Service: Orthopedics;  Laterality: Left;   JOINT REPLACEMENT     KNEE ARTHROSCOPY     right   LEFT AND RIGHT HEART CATHETERIZATION WITH CORONARY ANGIOGRAM N/A 04/23/2013   Procedure: LEFT AND RIGHT HEART CATHETERIZATION WITH CORONARY ANGIOGRAM;  Surgeon: Leonie Man, MD;  Location: [facility] CATH LAB;  Service: Cardiovascular;  Laterality: N/A;   LEFT HEART CATH AND CORONARY ANGIOGRAPHY N/A 03/11/2017   Procedure: Left Heart Cath and Coronary Angiography;  Surgeon: Sherren Mocha, MD;  Location: [place] CV LAB; angiographically minimal CAD in the LAD otherwise normal.;  Normal LVEDP.  FALSE POSITIVE MYOVIEW   LEFT HEART CATH AND CORONARY ANGIOGRAPHY  07/20/2010   LVEF 50-55% WITH VERY MILD GLOBAL HYPOKINESIA; ESSENTIALLY NORMAL CORONARY ARTERIES; NORMAL LV FUNCTION   METATARSAL OSTEOTOMY WITH OPEN REDUCTION INTERNAL FIXATION (ORIF) METATARSAL WITH FUSION Left 04/09/2014   Procedure: LEFT FOOT FRACTURE OPEN TREATMENT METATARSAL INCLUDES INTERNAL FIXATION EACH;  Surgeon: Lorn Junes, MD;  Location: [place];  Service: Orthopedics;  Laterality: Left;   NISSEN FUNDOPLICATION  1941   NM MYOVIEW LTD --> FALSE POSITIVE  03/10/2017   : Moderate size "stress-induced "perfusion defect at the apex as well as "ill-defined stress-induced perfusion defect in the lateral wall.  EF 55%.  INTERMEDIATE risk. -->  FALSE POSITIVE   ORIF DISTAL FEMUR FRACTURE  08/08/2020   [facility]  [place]: ORIF of left extra-articular periprosthetic distal femur fracture with synthesis of the femur plate with cortical nonlocking screws.  (Thought to be pathologic fracture due to osteoporosis) -> after a fall   ORIF FEMUR W/ PERI-IMPLANT  08/29/2020   [facility]) revision of left femur fracture ORIF plate fixation screws; culture positive for MSSA   Right and left CARDIAC CATHETERIZATION  04/23/2013   Angiographic normal coronaries; LVEDP 20 mmHg, PCWP 12-14 mmHg, RAP 12 mmHg.; Fick CO/CI 4.9/2.2   ROTATOR CUFF REPAIR Right 12/09/2021   SHOULDER ARTHROSCOPY Left 03/14/2019   Procedure: LEFT SHOULDER ARTHROSCOPY, DEBRIDEMENT, AND DECOMPRESSION;  Surgeon: Newt Minion, MD;  Location: [place];  Service: Orthopedics;  Laterality: Left;   TOTAL KNEE ARTHROPLASTY Right 06/29/2017   Procedure: RIGHT TOTAL KNEE ARTHROPLASTY;  Surgeon: Newt Minion, MD;  Location: [place];  Service: Orthopedics;  Laterality: Right;   TOTAL KNEE ARTHROPLASTY Left 12/07/2017   TOTAL KNEE ARTHROPLASTY Left 12/07/2017   Procedure: LEFT TOTAL KNEE ARTHROPLASTY;  Surgeon: Newt Minion, MD;  Location: [place];  Service: Orthopedics;  Laterality: Left;   TRACHEOSTOMY TUBE PLACEMENT N/A 09/20/2017   Procedure: AWAKE INTUBATION WITH ANESTHESIA WITH VIDEO ASSISTANCE;  Surgeon: Helayne Seminole, MD;  Location: [place] [facility];  Service: ENT;  Laterality: N/A;   TRANSTHORACIC ECHOCARDIOGRAM  08/2014   Normal LV size and function.  Mild LVH.  EF 55-60%.  Normal regional wall motion.  GR 1 DD.  Normal RV size and function .   TRANSTHORACIC ECHOCARDIOGRAM  08/12/2020    [facility]): Normal LV size and function.  EF 55 to 60%.  No RWM A.  Normal filling pattern.  Normal RV.  No significant valvular disease-stenosis or regurgitation.  No vegetation.   TUBAL LIGATION     with reversal in [place]      Family History  Problem Relation Age of Onset    Diabetes Father    Heart attack Father 7   Coronary artery disease Father    Heart failure Father    Throat cancer Father    Hypertension Father    Heart disease Father    Sleep apnea Father    Obesity Father    COPD Mother    Emphysema Mother    Asthma Mother    Heart failure Mother    Breast cancer Mother    Diabetes Mother    Kidney disease Mother    Thyroid disease Mother    Heart attack Maternal Grandfather    Sarcoidosis Maternal Uncle    Lung cancer Brother    Diabetes Brother    Colon cancer Neg Hx    Colon polyps Neg Hx    Esophageal cancer Neg Hx    Rectal cancer Neg Hx    Stomach cancer Neg Hx     Allergies  Allergen Reactions   Methotrexate Other (See Comments)    Peri-oral and buccal lesions   Vancomycin Other (See Comments)    DOSE RELATED NEPHROTOXICITY Other reaction(s): other   Lisinopril Cough    Other reaction(s): other   Chlorhexidine Itching   Clindamycin/Lincomycin Nausea And Vomiting and Rash   Lincomycin Nausea Only and Rash    Other reaction(s): vomiting   Teflaro [Ceftaroline] Rash and Other (See Comments)    Tolerates ceftriaxone     Current Medications:   Current Outpatient Medications:    albuterol (PROVENTIL HFA;VENTOLIN HFA) 108 (90 Base) MCG/ACT inhaler, Inhale 1-2 puffs into the lungs every 6 (six) hours as needed for wheezing or shortness of breath., Disp: 8 g, Rfl: 5   albuterol (PROVENTIL) (2.5 MG/3ML) 0.083% nebulizer solution, Take 2.5 mg by nebulization every 6 (six) hours as needed for wheezing or shortness of breath., Disp: , Rfl:    allopurinol (ZYLOPRIM) 300 MG tablet, TAKE 1 TABLET BY MOUTH EVERY DAY, Disp: 90 tablet, Rfl: 0   aspirin EC 81 MG tablet, Take 1 tablet (81 mg total) by mouth daily., Disp: 30 tablet, Rfl: 11   atorvastatin (LIPITOR) 40 MG tablet, TAKE 1 TABLET BY MOUTH EVERY DAY, Disp: 90 tablet, Rfl: 3   benzonatate (TESSALON PERLES) 100 MG capsule, Take 2 capsules (200 mg total) by mouth 3 (three) times  daily as needed for cough., Disp: 20 capsule, Rfl: 0  BREO ELLIPTA 200-25 MCG/INH AEPB, TAKE 1 PUFF BY MOUTH EVERY DAY (Patient taking differently: Inhale 1 puff into the lungs daily.), Disp: 60 each, Rfl: 5   buPROPion (WELLBUTRIN XL) 300 MG 24 hr tablet, TAKE 1 TABLET BY MOUTH EVERY DAY, Disp: 90 tablet, Rfl: 3   carvedilol (COREG) 25 MG tablet, Take 1 tablet by mouth daily., Disp: , Rfl:    chlorpheniramine-HYDROcodone (TUSSIONEX) 10-8 MG/5ML, Take 5 mLs by mouth at bedtime as needed for cough (do not drive for 8 hours after taking)., Disp: 70 mL, Rfl: 0   Cholecalciferol 25 MCG (1000 UT) capsule, Take 1,000 Units by mouth in the morning and at bedtime., Disp: , Rfl:    Continuous Blood Gluc Sensor (FREESTYLE LIBRE 3 SENSOR) MISC, 1 Device by Does not apply route every 14 (fourteen) days. Apply 1 sensor on upper arm every 14 days for continuous glucose monitoring, Disp: 2 each, Rfl: 2   dexlansoprazole (DEXILANT) 60 MG capsule, Take 1 capsule (60 mg total) by mouth daily., Disp: 30 capsule, Rfl: 11   diclofenac Sodium (VOLTAREN) 1 % GEL, Apply 1 application topically as needed (pain.). , Disp: , Rfl:    diltiazem (CARDIZEM CD) 120 MG 24 hr capsule, TAKE 1 CAPSULE BY MOUTH EVERY DAY, Disp: 90 capsule, Rfl: 3   Dulaglutide (TRULICITY) 4.5 DX/4.1OI SOPN, Inject 4.5 mg as directed once a week., Disp: 6 mL, Rfl: 3   famotidine (PEPCID) 20 MG tablet, Take 1 tablet (20 mg total) by mouth at bedtime., Disp: 30 tablet, Rfl: 3   fenofibrate 160 MG tablet, TAKE 1 TABLET BY MOUTH EVERY DAY, Disp: 90 tablet, Rfl: 3   fluticasone (FLONASE) 50 MCG/ACT nasal spray, Place 2 sprays into both nostrils daily as needed for allergies., Disp: 48 mL, Rfl: 3   furosemide (LASIX) 40 MG tablet, Take 1 tablet (40 mg total) by mouth daily., Disp: , Rfl:    gabapentin (NEURONTIN) 300 MG capsule, Take at night in addition to the '100mg'$  gabapentin to get '400mg'$  at night and '100mg'$  in the morning and at mid day., Disp: 90 capsule,  Rfl: 3   glucose blood (CONTOUR NEXT TEST) test strip, 1 each by Other route 4 (four) times daily. And lancets 4/day, Disp: 400 each, Rfl: 3   insulin aspart (NOVOLOG FLEXPEN) 100 UNIT/ML FlexPen, Inject 15 Units into the skin daily with supper., Disp: 15 mL, Rfl: 11   insulin glargine (LANTUS SOLOSTAR) 100 UNIT/ML Solostar Pen, Inject 16 Units into the skin every morning. (Patient taking differently: Inject 18 Units into the skin every morning.), Disp: 30 mL, Rfl: 2   Insulin Pen Needle (PEN NEEDLES) 32G X 4 MM MISC, 1 each by Does not apply route 4 (four) times daily. E11.9, Disp: 400 each, Rfl: 0   ipratropium (ATROVENT) 0.06 % nasal spray, Place 2 sprays into both nostrils 4 (four) times daily., Disp: 15 mL, Rfl: 1   Lancet Devices (EASY MINI EJECT LANCING DEVICE) MISC, Use as directed 4 times daily to test blood sugar, Disp: 1 each, Rfl: 3   LINZESS 290 MCG CAPS capsule, TAKE 1 CAPSULE BY MOUTH DAILY BEFORE BREAKFAST. (Patient taking differently: Take 290 mcg by mouth daily as needed (IBS symptoms).), Disp: 30 capsule, Rfl: 3   meloxicam (MOBIC) 15 MG tablet, Take 1 tablet (15 mg total) by mouth daily. (Patient taking differently: Take 15 mg by mouth daily as needed for pain.), Disp: 30 tablet, Rfl: 1   metFORMIN (GLUCOPHAGE-XR) 500 MG 24 hr tablet, Take 4  tablets (2,000 mg total) by mouth daily with breakfast., Disp: 360 tablet, Rfl: 3   Microlet Lancets MISC, 1 each by Does not apply route 2 (two) times daily. E11.9, Disp: 100 each, Rfl: 2   ondansetron (ZOFRAN-ODT) 4 MG disintegrating tablet, Take 1 tablet (4 mg total) by mouth every 8 (eight) hours as needed for nausea or vomiting., Disp: 20 tablet, Rfl: 0   potassium chloride SA (KLOR-CON M20) 20 MEQ tablet, Take 1.5 tablets (30 mEq total) by mouth daily., Disp: , Rfl:    telmisartan (MICARDIS) 20 MG tablet, TAKE 1 TABLET BY MOUTH EVERY DAY, Disp: 90 tablet, Rfl: 1   venlafaxine XR (EFFEXOR-XR) 75 MG 24 hr capsule, TAKE 1 CAPSULE BY MOUTH  TWICE A DAY, Disp: 180 capsule, Rfl: 3   Review of Systems:   Review of Systems  Constitutional:  Positive for malaise/fatigue. Negative for chills, fever and weight loss.  HENT:  Positive for congestion, ear pain, sinus pain and sore throat. Negative for hearing loss.   Respiratory:  Positive for cough. Negative for hemoptysis.   Cardiovascular:  Positive for chest pain (only with coughing). Negative for palpitations, leg swelling and PND.  Gastrointestinal:  Negative for abdominal pain, constipation, diarrhea, heartburn, nausea and vomiting.  Genitourinary:  Negative for dysuria, frequency and urgency.  Musculoskeletal:  Positive for myalgias. Negative for back pain and neck pain.  Skin:  Negative for itching and rash.  Neurological:  Positive for headaches. Negative for dizziness, tingling and seizures.  Endo/Heme/Allergies:  Negative for polydipsia.  Psychiatric/Behavioral:  Negative for depression. The patient is not nervous/anxious.     Vitals:   Vitals:   07/23/22 1133  BP: 136/83  Pulse: (!) 107  Temp: 97.8 F (36.6 C)  TempSrc: Temporal  SpO2: 99%  Weight: 216 lb 6.4 oz (98.2 kg)  Height: '5\' 6"'$  (1.676 m)     Body mass index is 34.93 kg/m.  Physical Exam:   Physical Exam Vitals and nursing note reviewed.  Constitutional:      General: She is not in acute distress.    Appearance: She is well-developed. She is not ill-appearing or toxic-appearing.  HENT:     Head: Normocephalic and atraumatic.     Right Ear: Ear canal and external ear normal. A middle ear effusion is present. Tympanic membrane is not erythematous, retracted or bulging.     Left Ear: Ear canal and external ear normal. A middle ear effusion is present. Tympanic membrane is not erythematous, retracted or bulging.     Nose: Nose normal.     Right Sinus: No maxillary sinus tenderness or frontal sinus tenderness.     Left Sinus: No maxillary sinus tenderness or frontal sinus tenderness.      Mouth/Throat:     Pharynx: Uvula midline. No posterior oropharyngeal erythema.  Eyes:     General: Lids are normal.     Conjunctiva/sclera: Conjunctivae normal.  Neck:     Trachea: Trachea normal.  Cardiovascular:     Rate and Rhythm: Normal rate and regular rhythm.     Heart sounds: Normal heart sounds, S1 normal and S2 normal.  Pulmonary:     Effort: Pulmonary effort is normal.     Breath sounds: Normal breath sounds. No decreased breath sounds, wheezing, rhonchi or rales.  Lymphadenopathy:     Cervical: No cervical adenopathy.  Skin:    General: Skin is warm and dry.  Neurological:     Mental Status: She is alert.  Psychiatric:  Speech: Speech normal.        Behavior: Behavior normal. Behavior is cooperative.    Results for orders placed or performed in visit on 07/23/22  POC COVID-19  Result Value Ref Range   SARS Coronavirus 2 Ag Negative Negative  POCT Influenza A/B  Result Value Ref Range   Influenza A, POC     Influenza B, POC    POCT rapid strep A  Result Value Ref Range   Rapid Strep A Screen Positive (A) Negative  POCT Urinalysis Dipstick (Automated)  Result Value Ref Range   Color, UA yellow    Clarity, UA clear    Glucose, UA Negative Negative   Bilirubin, UA negative    Ketones, UA negative    Spec Grav, UA 1.015 1.010 - 1.025   Blood, UA negative    pH, UA 5.5 5.0 - 8.0   Protein, UA Negative Negative   Urobilinogen, UA 0.2 0.2 or 1.0 E.U./dL   Nitrite, UA negative    Leukocytes, UA Moderate (2+) (A) Negative     Assessment and Plan:   Flu-like symptoms No red flags on exam COVID and flu test are negative Suspect that symptoms are due to positive strep infection, vaccination of RSV and COVID yesterday and possible early UTI Rest, tylenol prn, push fluids  Sore throat Strep test is positive Start amoxicillin 500 mg BID x 10 days Follow-up if new/worsening  Dysuria No red flags on exam Moderate leuks on UA Will plan for start of  amoxicillin 500 mg BID  Urine culture sent -- will add treatment if indicated Worsening precautions advised   Inda Coke, PA-C   I,Alexis Herring,acting as a scribe for [employer], PA.,have documented all relevant documentation on the behalf of Inda Coke, PA,as directed by  Inda Coke, PA while in the presence of Inda Coke, [place].

## 2022-07-23 NOTE — Patient Instructions (Addendum)
It was great to see you!  COVID and flu tests are negative.  You have strep and side effects from your vaccines yesterday. We are getting a urine study to evaluate for possible UTI as well.  Stop Flonase for a bit and trial Atrovent nasal spray to dry out your ear congestion/pressure/pain.  Tussionex cough syrup refilled for you -- please use wisely and do not drive while taking this medication.   I will be in touch with your urine culture results and if we need to add additional antibiotics for this.  Push fluids and rest!  General instructions Make sure you: Pee until your bladder is empty. Do not hold pee for a long time. Empty your bladder after sex. Wipe from front to back after pooping if you are a female. Use each tissue one time when you wipe. Drink enough fluid to keep your pee pale yellow. Keep all follow-up visits as told by your doctor. This is important. Contact a doctor if: You do not get better after 1-2 days. Your symptoms go away and then come back. Get help right away if: You have very bad back pain. You have very bad pain in your lower belly. You have a fever. You are sick to your stomach (nauseous). You are throwing up.

## 2022-07-25 LAB — URINE CULTURE
MICRO NUMBER:: 14048469
SPECIMEN QUALITY:: ADEQUATE

## 2022-07-26 ENCOUNTER — Other Ambulatory Visit: Payer: Self-pay | Admitting: Physician Assistant

## 2022-07-26 ENCOUNTER — Other Ambulatory Visit (HOSPITAL_COMMUNITY): Payer: Self-pay

## 2022-07-26 ENCOUNTER — Ambulatory Visit: Payer: BC Managed Care – PPO | Admitting: Family Medicine

## 2022-07-26 MED ORDER — NITROFURANTOIN MONOHYD MACRO 100 MG PO CAPS: 100.0000 mg | ORAL_CAPSULE | Freq: Two times a day (BID) | ORAL | 0 refills | Status: DC

## 2022-07-28 ENCOUNTER — Ambulatory Visit (INDEPENDENT_AMBULATORY_CARE_PROVIDER_SITE_OTHER): Payer: BC Managed Care – PPO | Admitting: Family Medicine

## 2022-07-28 VITALS — BP 156/76 | HR 89 | Ht 66.0 in | Wt 215.0 lb

## 2022-07-28 DIAGNOSIS — M79645 Pain in left finger(s): Secondary | ICD-10-CM | POA: Diagnosis not present

## 2022-07-28 NOTE — Progress Notes (Signed)
I, Peterson Lombard, LAT, ATC acting as a scribe for [name], MD.  [name] is a 61 y.o. female who presents to [place] at [facility] today for continued left thumb pain.  Patient was last seen by Dr. Georgina Snell on 07/13/2022 and was given a steroid injection in her left first [facility] joint.  Today, patient reports prior steroid injection only lasted for about a week. Pt c/o difficulty gripping and dropping objects due to the pain in her L thumb.  Dx imaging: 04/19/22 L wrist MRI arthrogram             04/07/22 L wrist XR             03/29/22 L hand XR  Pertinent review of systems: No fevers or chills  Relevant historical information: Sarcoidosis and asthma.   Exam:  BP (!) 156/76   Pulse 89   Ht '5\' 6"'$  (1.676 m)   Wt 215 lb (97.5 kg)   SpO2 99%   BMI 34.70 kg/m  General: Well Developed, well nourished, and in no acute distress.   MSK: Left thumb tender palpation first [place].  Bossing first [place].  Decreased motion.    Lab and Radiology Results EXAM: MR OF THE LEFT WRIST WITH CONTRAST   TECHNIQUE: Multiplanar, multisequence MR imaging of the left wrist was performed following the administration of intravenous contrast.   COMPARISON:  Radiographs 04/05/2022   CONTRAST:  29m GADAVIST GADOBUTROL 1 MMOL/ML IV SOLN   FINDINGS: There is contrast in the midcarpal joint space but I do not have any images of with needle was placed. There is contrast in the lunotriquetral and scapholunate joints but I do not see any definite ligament tears. There is also contrast in the carpal tunnel which could be from extravasation or needle placement.   Ligaments: The intercarpal ligaments appear intact.   Triangular fibrocartilage: Intact. No extravasation of contrast into the radioulnar joint space.   Tendons: The dorsal wrist tendons are intact. No tendinopathy or tenosynovitis.   Carpal tunnel/median nerve: The carpal tunnel tendons appear normal. The carpal  tunnel is filled with contrast as above. The median nerve is grossly normal.   Guyon's canal: Normal appearance of the ulnar nerve.   Joint/cartilage: The articular cartilage appears intact. No cartilage defects or osteochondral lesion. No obvious synovitis.   Bones/carpal alignment: The carpal alignment is normal. No marrow edema or occult fracture.   Other: Moderate to advanced degenerative changes are noted at the [facility].   IMPRESSION: 1. Contrast in the midcarpal joint space but no definite scapholunate or lunotriquetral ligament tear. Contrast in the carpal tunnel could be from extravasation or needle placement. 2. Intact intercarpal ligaments and TFCC. 3. No acute bony findings. 4. The major wrist tendons are intact. 5. Moderate to advanced degenerative changes at the [facility] the thumb.     Electronically Signed   By: PMarijo SanesM.D.   On: 04/20/2022 15:56 I, ELynne Leader personally (independently) visualized and performed the interpretation of the images attached in this note.     Assessment and Plan: 61y.o. female with left thumb pain [facility].  Failing conservative management including trial of hand therapy and more recently [facility]  This injection was performed on October 3 and already has worn off.  Plan to refer to hand surgery to discuss surgical options.  We also discussed some ergonomic options in the kitchen including devices  to allow her to open jars and bottles more easily.   PDMP not reviewed this encounter. Orders Placed This Encounter  Procedures   Ambulatory referral to Orthopedic Surgery    Referral Priority:   Routine    Referral Type:   Surgical    Referral Reason:   Specialty Services Required    Requested Specialty:   Orthopedic Surgery    Number of Visits Requested:   1   No orders of the defined types were placed in this encounter.    Discussed warning signs or symptoms. Please see discharge  instructions. Patient expresses understanding.   The above documentation has been reviewed and is accurate and complete [name], M.D.

## 2022-07-28 NOTE — Patient Instructions (Signed)
Thank you for coming in today.   You should hear about surgery referral soon.   Try some of those bottle and jar openers for hand arthritis.   Let me know.

## 2022-08-03 ENCOUNTER — Telehealth: Payer: Self-pay | Admitting: Family Medicine

## 2022-08-03 NOTE — Telephone Encounter (Unsigned)
Pt has not heard from [facility] about referral to hand surgeon. Referral done so I gave pt the phone # to contact [facility] directly.  [name] having hand pain and OTC meds not helping. Can we send something in for her pain?  [name] [address] [place].

## 2022-08-05 MED ORDER — HYDROCODONE-ACETAMINOPHEN 5-325 MG PO TABS: 1.0000 | ORAL_TABLET | Freq: Four times a day (QID) | ORAL | 0 refills | Status: DC | PRN

## 2022-08-05 NOTE — Telephone Encounter (Signed)
Hydrocodone prescribed.

## 2022-08-05 NOTE — Telephone Encounter (Signed)
Patient informed and is scheduled to see [facility] November 4th

## 2022-08-11 ENCOUNTER — Ambulatory Visit (INDEPENDENT_AMBULATORY_CARE_PROVIDER_SITE_OTHER): Payer: BC Managed Care – PPO | Admitting: Family

## 2022-08-11 ENCOUNTER — Encounter: Payer: Self-pay | Admitting: Family

## 2022-08-11 VITALS — BP 138/75 | HR 97 | Temp 98.0°F | Ht 66.0 in | Wt 216.0 lb

## 2022-08-11 DIAGNOSIS — D860 Sarcoidosis of lung: Secondary | ICD-10-CM

## 2022-08-11 DIAGNOSIS — R350 Frequency of micturition: Secondary | ICD-10-CM

## 2022-08-11 LAB — POCT URINALYSIS DIPSTICK
Bilirubin, UA: NEGATIVE
Blood, UA: NEGATIVE
Glucose, UA: NEGATIVE
Ketones, UA: NEGATIVE
Nitrite, UA: NEGATIVE
Protein, UA: NEGATIVE
Spec Grav, UA: 1.015 (ref 1.010–1.025)
Urobilinogen, UA: 0.2 E.U./dL
pH, UA: 6 (ref 5.0–8.0)

## 2022-08-11 MED ORDER — IPRATROPIUM BROMIDE 0.02 % IN SOLN: 0.5000 mg | Freq: Four times a day (QID) | RESPIRATORY_TRACT | 1 refills | Status: DC | PRN

## 2022-08-11 MED ORDER — PREDNISONE 20 MG PO TABS: ORAL_TABLET | ORAL | 0 refills | Status: DC

## 2022-08-11 NOTE — Progress Notes (Signed)
Patient ID: [name], female    DOB: [birth year], 61 y.o.   MRN: [id number]  Chief Complaint  Patient presents with   Back Pain    Pt c/o lower back pain, urinary frequency for 2 days. Has tried tylenol which did help pain some.    Cough    Pt c/o Cough with green mucus for about 3-4 days, Has tried delsym which does help some.    HPI:      Urinary sx;: pt c/o dysura, frequency, low back pain, denies N,V pelvic pain. Reports recently tx 10/18 w/low dose Augmentin for UTI, felt better and sx have returned.  Persistent cough:  pt tx for sinusitis, Augmentin back in September, prior to that she had an exacerbation of her Sarcoidosis and was hospitalized. Has since seen Pulmonary w/no changes in her regimen - Breo & Albuterol nebulizer. Pt reports having a lot of mucus she coughs up daily.  Assessment & Plan:  1. Urinary frequency UA with mild leuks, just finished abt for previous UTI 1 week ago, will wait for culture results, advised pt hydration w/2L water qd and proper hygiene.   - POCT Urinalysis Dipstick - Urine Culture  2. Sarcoidosis of lung ([place]) pt in hospital for exacerbation late august, since she has had abt for sinusitis and UTI. Today with sx of bronchitis again. Pt reports a lot of chest mucus, lung sounds bronchial, but no wheezing. Sending Atrovent to add to her albuterol in nebulizer and advised to use  3 times per day. Also sending low dose pred pack, advised on use & SE of meds, increase water intake.   - ipratropium (ATROVENT) 0.02 % nebulizer solution; Take 2.5 mLs (0.5 mg total) by nebulization every 6 (six) hours as needed for wheezing or shortness of breath.  Dispense: 75 mL; Refill: 1 - predniSONE (DELTASONE) 20 MG tablet; Take 2 pills in the morning with breakfast for 4 days, then 1 pill for 3 days  Dispense: 11 tablet; Refill: 0  Subjective:    Outpatient Medications Prior to Visit  Medication Sig Dispense Refill   albuterol (PROVENTIL HFA;VENTOLIN  HFA) 108 (90 Base) MCG/ACT inhaler Inhale 1-2 puffs into the lungs every 6 (six) hours as needed for wheezing or shortness of breath. 8 g 5   albuterol (PROVENTIL) (2.5 MG/3ML) 0.083% nebulizer solution Take 2.5 mg by nebulization every 6 (six) hours as needed for wheezing or shortness of breath.     allopurinol (ZYLOPRIM) 300 MG tablet TAKE 1 TABLET BY MOUTH EVERY DAY 90 tablet 0   amoxicillin-clavulanate (AUGMENTIN) 250-125 MG tablet Take 1 tablet by mouth 3 (three) times daily.     aspirin EC 81 MG tablet Take 1 tablet (81 mg total) by mouth daily. 30 tablet 11   atorvastatin (LIPITOR) 40 MG tablet TAKE 1 TABLET BY MOUTH EVERY DAY 90 tablet 3   benzonatate (TESSALON PERLES) 100 MG capsule Take 2 capsules (200 mg total) by mouth 3 (three) times daily as needed for cough. 20 capsule 0   BREO ELLIPTA 200-25 MCG/INH AEPB TAKE 1 PUFF BY MOUTH EVERY DAY (Patient taking differently: Inhale 1 puff into the lungs daily.) 60 each 5   buPROPion (WELLBUTRIN XL) 300 MG 24 hr tablet TAKE 1 TABLET BY MOUTH EVERY DAY 90 tablet 3   carvedilol (COREG) 25 MG tablet Take 1 tablet by mouth daily.     chlorpheniramine-HYDROcodone (TUSSIONEX) 10-8 MG/5ML Take 5 mLs by mouth at bedtime as needed for cough (do not drive  for 8 hours after taking). 70 mL 0   Cholecalciferol 25 MCG (1000 UT) capsule Take 1,000 Units by mouth in the morning and at bedtime.     Continuous Blood Gluc Sensor (FREESTYLE LIBRE 3 SENSOR) MISC 1 Device by Does not apply route every 14 (fourteen) days. Apply 1 sensor on upper arm every 14 days for continuous glucose monitoring 2 each 2   dexlansoprazole (DEXILANT) 60 MG capsule Take 1 capsule (60 mg total) by mouth daily. 30 capsule 11   diclofenac Sodium (VOLTAREN) 1 % GEL Apply 1 application topically as needed (pain.).      diltiazem (CARDIZEM CD) 120 MG 24 hr capsule TAKE 1 CAPSULE BY MOUTH EVERY DAY 90 capsule 3   Dulaglutide (TRULICITY) 4.5 YC/1.4GY SOPN Inject 4.5 mg as directed once a week. 6  mL 3   famotidine (PEPCID) 20 MG tablet Take 1 tablet (20 mg total) by mouth at bedtime. 30 tablet 3   fenofibrate 160 MG tablet TAKE 1 TABLET BY MOUTH EVERY DAY 90 tablet 3   fluticasone (FLONASE) 50 MCG/ACT nasal spray Place 2 sprays into both nostrils daily as needed for allergies. 48 mL 3   furosemide (LASIX) 40 MG tablet Take 1 tablet (40 mg total) by mouth daily.     gabapentin (NEURONTIN) 300 MG capsule Take at night in addition to the '100mg'$  gabapentin to get '400mg'$  at night and '100mg'$  in the morning and at mid day. 90 capsule 3   glucose blood (CONTOUR NEXT TEST) test strip 1 each by Other route 4 (four) times daily. And lancets 4/day 400 each 3   HYDROcodone-acetaminophen (NORCO/VICODIN) 5-325 MG tablet Take 1 tablet by mouth every 6 (six) hours as needed. 15 tablet 0   insulin aspart (NOVOLOG FLEXPEN) 100 UNIT/ML FlexPen Inject 15 Units into the skin daily with supper. 15 mL 11   insulin glargine (LANTUS SOLOSTAR) 100 UNIT/ML Solostar Pen Inject 16 Units into the skin every morning. (Patient taking differently: Inject 18 Units into the skin every morning.) 30 mL 2   Insulin Pen Needle (PEN NEEDLES) 32G X 4 MM MISC 1 each by Does not apply route 4 (four) times daily. E11.9 400 each 0   ipratropium (ATROVENT) 0.06 % nasal spray Place 2 sprays into both nostrils 4 (four) times daily. 15 mL 1   Lancet Devices (EASY MINI EJECT LANCING DEVICE) MISC Use as directed 4 times daily to test blood sugar 1 each 3   LINZESS 290 MCG CAPS capsule TAKE 1 CAPSULE BY MOUTH DAILY BEFORE BREAKFAST. (Patient taking differently: Take 290 mcg by mouth daily as needed (IBS symptoms).) 30 capsule 3   meloxicam (MOBIC) 15 MG tablet Take 1 tablet (15 mg total) by mouth daily. (Patient taking differently: Take 15 mg by mouth daily as needed for pain.) 30 tablet 1   metFORMIN (GLUCOPHAGE-XR) 500 MG 24 hr tablet Take 4 tablets (2,000 mg total) by mouth daily with breakfast. 360 tablet 3   Microlet Lancets MISC 1 each by  Does not apply route 2 (two) times daily. E11.9 100 each 2   nitrofurantoin, macrocrystal-monohydrate, (MACROBID) 100 MG capsule Take 1 capsule (100 mg total) by mouth 2 (two) times daily. 10 capsule 0   ondansetron (ZOFRAN-ODT) 4 MG disintegrating tablet Take 1 tablet (4 mg total) by mouth every 8 (eight) hours as needed for nausea or vomiting. 20 tablet 0   potassium chloride SA (KLOR-CON M20) 20 MEQ tablet Take 1.5 tablets (30 mEq total) by mouth daily.  sulfamethoxazole-trimethoprim (BACTRIM) 200-40 MG/5ML suspension Take by mouth 2 (two) times daily.     telmisartan (MICARDIS) 20 MG tablet TAKE 1 TABLET BY MOUTH EVERY DAY 90 tablet 1   venlafaxine XR (EFFEXOR-XR) 75 MG 24 hr capsule TAKE 1 CAPSULE BY MOUTH TWICE A DAY 180 capsule 3   No facility-administered medications prior to visit.   Past Medical History:  Diagnosis Date   Abnormal SPEP 04/17/2014   Acute left ankle pain 01/26/2017   ANEMIA-UNSPECIFIED 09/18/2009   Anxiety    Arthritis    Blood transfusion without reported diagnosis    Cataract    CHF (congestive heart failure) (HCC)    Chronic diastolic heart failure, NYHA class 2 (HCC)    Normal LVEDP by May 2018   COPD (chronic obstructive pulmonary disease) ([place])    Depression    DIABETES MELLITUS, TYPE II 08/21/2006   Diabetic osteomyelitis ([place]) 05/29/2015   Fatty liver    Fracture of 5th metatarsal    non union   GERD 12/75/1700   Had fundoplication   GOUT 17/49/4496   Hammer toe of right foot    2-5th toes   Hx of umbilical hernia repair    HYPERLIPIDEMIA 08/21/2006   HYPERTENSION 08/21/2006   Infection of wound due to methicillin resistant Staphylococcus aureus (MRSA)    Internal hemorrhoids    Kidney problem    Multiple allergies 10/14/2016   OBESITY 06/04/2009   Onychomycosis 10/27/2015   Osteomyelitis of left foot ([place]) 05/29/2015   Osteoporosis    Pulmonary sarcoidosis ([place])    Followed locally by pulmonology, but also by Dr. Casper Harrison at [facility]   Right knee pain 01/26/2017   Vocal cord dysfunction    Wears partial dentures    Past Surgical History:  Procedure Laterality Date   ABDOMINAL HYSTERECTOMY     APPENDECTOMY     BLADDER SUSPENSION  11/11/2011   Procedure: TRANSVAGINAL TAPE (TVT) PROCEDURE;  Surgeon: Olga Millers, MD;  Location: [place] [facility];  Service: Gynecology;  Laterality: N/A;   CAROTIDS  02/18/2011   CAROTID DUPLEX; VERTEBRALS ARE PATENT WITH ANTEGRADE FLOW. ICA/CCA RATIO 1.61 ON RIGHT AND 0.75 ON LEFT   CATARACT EXTRACTION, BILATERAL     CHOLECYSTECTOMY  1984   COLONOSCOPY  04/29/2010   [name]   CYSTOSCOPY  11/11/2011   Procedure: CYSTOSCOPY;  Surgeon: Olga Millers, MD;  Location: [place] [facility];  Service: Gynecology;  Laterality: N/A;   EXTUBATION (ENDOTRACHEAL) IN OR N/A 09/23/2017   Procedure: EXTUBATION (ENDOTRACHEAL) IN OR;  Surgeon: Helayne Seminole, MD;  Location: [place];  Service: ENT;  Laterality: N/A;   FIBEROPTIC LARYNGOSCOPY AND TRACHEOSCOPY N/A 09/23/2017   Procedure: FLEXIBLE FIBEROPTIC LARYNGOSCOPY;  Surgeon: Helayne Seminole, MD;  Location: [place];  Service: ENT;  Laterality: N/A;   FRACTURE SURGERY     foot   [place] Left 10/02/2018   Procedure: HALLUX ARTHRODESIS;  Surgeon: Edrick Kins, DPM;  Location: [facility];  Service: Podiatry;  Laterality: Left;   HAMMER TOE SURGERY Left 10/02/2018   Procedure: HAMMER TOE CORRECTION 2ND 3RD 4RD FIFTH TOE;  Surgeon: Edrick Kins, DPM;  Location: [facility];  Service: Podiatry;  Laterality: Left;   HAMMER TOE SURGERY Right 04/12/2019   Procedure: HAMMER TOE CORRECTION, 2ND, 3RD, 4TH AND 5TH TOES OF RIGHT FOOT;  Surgeon: Edrick Kins, DPM;  Location: [place];  Service: Podiatry;  Laterality: Right;   HAMMER TOE SURGERY Left 10/25/2019   Procedure: HAMMER TOE REPAIR  SECOND, THIRD, FOUTH;  Surgeon: Edrick Kins, DPM;  Location: [place];  Service: Podiatry;  Laterality: Left;   HERNIA REPAIR     I & D EXTREMITY Left 06/27/2015   Procedure: Partial  Excision Left Calcaneus, Place Antibiotic Beads, and Wound VAC;  Surgeon: Newt Minion, MD;  Location: [place];  Service: Orthopedics;  Laterality: Left;   JOINT REPLACEMENT     KNEE ARTHROSCOPY     right   LEFT AND RIGHT HEART CATHETERIZATION WITH CORONARY ANGIOGRAM N/A 04/23/2013   Procedure: LEFT AND RIGHT HEART CATHETERIZATION WITH CORONARY ANGIOGRAM;  Surgeon: Leonie Man, MD;  Location: [facility] CATH LAB;  Service: Cardiovascular;  Laterality: N/A;   LEFT HEART CATH AND CORONARY ANGIOGRAPHY N/A 03/11/2017   Procedure: Left Heart Cath and Coronary Angiography;  Surgeon: Sherren Mocha, MD;  Location: [place] CV LAB; angiographically minimal CAD in the LAD otherwise normal.;  Normal LVEDP.  FALSE POSITIVE MYOVIEW   LEFT HEART CATH AND CORONARY ANGIOGRAPHY  07/20/2010   LVEF 50-55% WITH VERY MILD GLOBAL HYPOKINESIA; ESSENTIALLY NORMAL CORONARY ARTERIES; NORMAL LV FUNCTION   METATARSAL OSTEOTOMY WITH OPEN REDUCTION INTERNAL FIXATION (ORIF) METATARSAL WITH FUSION Left 04/09/2014   Procedure: LEFT FOOT FRACTURE OPEN TREATMENT METATARSAL INCLUDES INTERNAL FIXATION EACH;  Surgeon: Lorn Junes, MD;  Location: [place];  Service: Orthopedics;  Laterality: Left;   NISSEN FUNDOPLICATION  6734   NM MYOVIEW LTD --> FALSE POSITIVE  03/10/2017   : Moderate size "stress-induced "perfusion defect at the apex as well as "ill-defined stress-induced perfusion defect in the lateral wall.  EF 55%.  INTERMEDIATE risk. -->  FALSE POSITIVE   ORIF DISTAL FEMUR FRACTURE  08/08/2020   [facility]: ORIF of left extra-articular periprosthetic distal femur fracture with synthesis of the femur plate with cortical nonlocking screws.  (Thought to be pathologic fracture due to osteoporosis) -> after a fall   ORIF FEMUR W/ PERI-IMPLANT  08/29/2020   [facility]) revision of left femur fracture ORIF plate fixation screws; culture positive for MSSA   Right and left CARDIAC  CATHETERIZATION  04/23/2013   Angiographic normal coronaries; LVEDP 20 mmHg, PCWP 12-14 mmHg, RAP 12 mmHg.; Fick CO/CI 4.9/2.2   ROTATOR CUFF REPAIR Right 12/09/2021   SHOULDER ARTHROSCOPY Left 03/14/2019   Procedure: LEFT SHOULDER ARTHROSCOPY, DEBRIDEMENT, AND DECOMPRESSION;  Surgeon: Newt Minion, MD;  Location: [place];  Service: Orthopedics;  Laterality: Left;   TOTAL KNEE ARTHROPLASTY Right 06/29/2017   Procedure: RIGHT TOTAL KNEE ARTHROPLASTY;  Surgeon: Newt Minion, MD;  Location: [place];  Service: Orthopedics;  Laterality: Right;   TOTAL KNEE ARTHROPLASTY Left 12/07/2017   TOTAL KNEE ARTHROPLASTY Left 12/07/2017   Procedure: LEFT TOTAL KNEE ARTHROPLASTY;  Surgeon: Newt Minion, MD;  Location: [place];  Service: Orthopedics;  Laterality: Left;   TRACHEOSTOMY TUBE PLACEMENT N/A 09/20/2017   Procedure: AWAKE INTUBATION WITH ANESTHESIA WITH VIDEO ASSISTANCE;  Surgeon: Helayne Seminole, MD;  Location: [place] [facility];  Service: ENT;  Laterality: N/A;   TRANSTHORACIC ECHOCARDIOGRAM  08/2014   Normal LV size and function.  Mild LVH.  EF 55-60%.  Normal regional wall motion.  GR 1 DD.  Normal RV size and function .   TRANSTHORACIC ECHOCARDIOGRAM  08/12/2020    [facility]): Normal LV size and function.  EF 55 to 60%.  No RWM A.  Normal filling pattern.  Normal RV.  No significant valvular disease-stenosis or regurgitation.  No vegetation.  TUBAL LIGATION     with reversal in 1994   UPPER GASTROINTESTINAL ENDOSCOPY     VENTRAL HERNIA REPAIR     Allergies  Allergen Reactions   Methotrexate Other (See Comments)    Peri-oral and buccal lesions   Vancomycin Other (See Comments)    DOSE RELATED NEPHROTOXICITY Other reaction(s): other   Lisinopril Cough    Other reaction(s): other   Chlorhexidine Itching   Clindamycin/Lincomycin Nausea And Vomiting and Rash   Lincomycin Nausea Only and Rash    Other reaction(s): vomiting   Teflaro [Ceftaroline] Rash and Other (See Comments)     Tolerates ceftriaxone       Objective:    Physical Exam Vitals and nursing note reviewed.  Constitutional:      Appearance: Normal appearance.  Cardiovascular:     Rate and Rhythm: Normal rate and regular rhythm.  Pulmonary:     Effort: Pulmonary effort is normal.     Breath sounds: Examination of the right-upper field reveals rhonchi. Examination of the left-upper field reveals rhonchi. Rhonchi (more bronchial) present.  Musculoskeletal:        General: Normal range of motion.  Skin:    General: Skin is warm and dry.  Neurological:     Mental Status: She is alert.  Psychiatric:        Mood and Affect: Mood normal.        Behavior: Behavior normal.    BP 138/75 (BP Location: Left Arm, Patient Position: Sitting, Cuff Size: Large)   Pulse 97   Temp 98 F (36.7 C) (Temporal)   Ht '5\' 6"'$  (1.676 m)   Wt 216 lb (98 kg)   SpO2 95%   BMI 34.86 kg/m  Wt Readings from Last 3 Encounters:  08/11/22 216 lb (98 kg)  07/28/22 215 lb (97.5 kg)  07/23/22 216 lb 6.4 oz (98.2 kg)       Jeanie Sewer, NP

## 2022-08-12 LAB — URINE CULTURE
MICRO NUMBER:: 14130356
SPECIMEN QUALITY:: ADEQUATE

## 2022-08-13 NOTE — Progress Notes (Signed)
Your urine culture is negative, no antibiotic needed.

## 2022-08-16 DIAGNOSIS — M79642 Pain in left hand: Secondary | ICD-10-CM | POA: Diagnosis not present

## 2022-08-16 DIAGNOSIS — M1812 Unilateral primary osteoarthritis of first carpometacarpal joint, left hand: Secondary | ICD-10-CM | POA: Diagnosis not present

## 2022-08-30 ENCOUNTER — Encounter: Payer: BC Managed Care – PPO | Attending: Endocrinology | Admitting: Registered"

## 2022-08-30 ENCOUNTER — Encounter: Payer: Self-pay | Admitting: Registered"

## 2022-08-30 DIAGNOSIS — E1165 Type 2 diabetes mellitus with hyperglycemia: Secondary | ICD-10-CM | POA: Insufficient documentation

## 2022-08-30 NOTE — Progress Notes (Signed)
Diabetes Self-Management Education  Visit Type: First/Initial  Appt. Start Time: 1100 Appt. End Time: 1215  08/30/2022  [name], identified by name and date of birth, is a 61 y.o. female with a diagnosis of Diabetes: Type 2.   ASSESSMENT  There were no vitals taken for this visit. There is no height or weight on file to calculate BMI.  A1c 8.4% Antidiabetic medciations: Metformin 7628 mg a day, Trulicity 4.5 mg weekly  Insulin:  Lantus 16 units in the morning Novolog 15 units at bedtime discontinued Ozempic d/t nausea.  Exercise: 2x/week gym but recently hasn't gone as much because son recently got married.  Stress: Pt states son, dauther-in-law and their pets moved in with her while they are looking for a more affordable apartment.    Medical history: CKD stage 3, Hammer toe surgery 2019 & 2020  Pt states she wants help with [employer] libre 3. Pt applied sensor to arm but her phone would not scan. Pt was advised to call Tech support.  Pt reports Stage 3 ckd, careful not to drink too much tea. Pt states she is not sure what to eat and would like to also understand what normal glucose levels are.   Diabetes Self-Management Education - 08/30/22 1000       Visit Information   Visit Type First/Initial      Initial Visit   Diabetes Type Type 2    Date Diagnosed 20+ yrs    Are you currently following a meal plan? No    Are you taking your medications as prescribed? Yes      Health Coping   How would you rate your overall health? Fair      Psychosocial Assessment   Patient Belief/Attitude about Diabetes Motivated to manage diabetes    What is the hardest part about your diabetes right now, causing you the most concern, or is the most worrisome to you about your diabetes?   Making healty food and beverage choices;Checking blood sugar;Getting support / problem solving    Self-care barriers None    Self-management support Doctor's office;CDE visits    Other persons  present Family Member    Patient Concerns Nutrition/Meal planning;Monitoring    Learning Readiness Ready    How often do you need to have someone help you when you read instructions, pamphlets, or other written materials from your doctor or pharmacy? 1 - Never    What is the last grade level you completed in school? 12      Complications   Last HgB A1C per patient/outside source 8.4 %    How often do you check your blood sugar? 0 times/day (not testing)    Have you had a dilated eye exam in the past 12 months? Yes    Have you had a dental exam in the past 12 months? No    Are you checking your feet? Yes    How many days per week are you checking your feet? 7      Dietary Intake   Breakfast cereal Or biscuit    Snack (morning) nuts    Lunch sandwich OR skip    Snack (afternoon) nuts, or short bread cookies    Dinner taco, chips, salsa    Snack (evening) cookies, ham or cheese in middle of night    Beverage(s) water, occasional 1/2 sweet & unsweet tea, diet sprite,      Activity / Exercise   Activity / Exercise Type Light (walking / raking leaves)  How many days per week do you exercise? 2    How many minutes per day do you exercise? 60    Total minutes per week of exercise 120      Patient Education   Previous Diabetes Education No    Disease Pathophysiology Definition of diabetes, type 1 and 2, and the diagnosis of diabetes    Healthy Eating Role of diet in the treatment of diabetes and the relationship between the three main macronutrients and blood glucose level;Plate Method;Meal options for control of blood glucose level and chronic complications.;Carbohydrate counting    Being Active Role of exercise on diabetes management, blood pressure control and cardiac health.    Monitoring Purpose and frequency of SMBG.;Identified appropriate SMBG and/or A1C goals.    Acute complications Taught prevention, symptoms, and  treatment of hypoglycemia - the 15 rule.    Diabetes Stress and  Support Role of stress on diabetes      Individualized Goals (developed by patient)   Nutrition General guidelines for healthy choices and portions discussed    Monitoring  Consistenly use CGM;Other (comment)   call [employer] tech support     Outcomes   Expected Outcomes Demonstrated interest in learning. Expect positive outcomes    Future DMSE 4-6 wks    Program Status Not Completed             Individualized Plan for Diabetes Self-Management Training:   Learning Objective:  Patient will have a greater understanding of diabetes self-management. Patient education plan is to attend individual and/or group sessions per assessed needs and concerns.   Patient Instructions  Stop by Dr. Ronnie Derby office and ask his assistant about timing of when you take your Novolog (with meals?) Ask your doctor what your goal is for your A1c  Aim to balanced meals and snacks: Breakfast: pancake, sugar-free syrup, 1 piece of bacon, boiled egg Or boiled egg and 100% whole wheat OR [place] yogurt Lunch: (low sodium) ham and cheese sandwich, 1/2 apple and peanut butter (not too often due to sodium) Dinner: Broiled chicken, small potato, green beans and broccoli or salad Snack: nuts and orange   Call [employer] to see if they can help with getting the sensor to pair with your app   Expected Outcomes:  Demonstrated interest in learning. Expect positive outcomes  Education material provided: My Plate and Carbohydrate counting sheet  If problems or questions, patient to contact team via:  Phone and MyChart  Future DSME appointment: 4-6 wks

## 2022-08-30 NOTE — Patient Instructions (Addendum)
Stop by Dr. Ronnie Derby office and ask his assistant about timing of when you take your Novolog (with meals?) Ask your doctor what your goal is for your A1c  Aim to balanced meals and snacks: Breakfast: pancake, sugar-free syrup, 1 piece of bacon, boiled egg Or boiled egg and 100% whole wheat OR [place] yogurt Lunch: (low sodium) ham and cheese sandwich, 1/2 apple and peanut butter (not too often due to sodium) Dinner: Broiled chicken, small potato, green beans and broccoli or salad Snack: nuts and orange   Call [employer] to see if they can help with getting the sensor to pair with your app

## 2022-09-03 ENCOUNTER — Ambulatory Visit: Payer: BC Managed Care – PPO | Admitting: Dietician

## 2022-09-03 ENCOUNTER — Other Ambulatory Visit: Payer: Self-pay | Admitting: Physician Assistant

## 2022-09-07 ENCOUNTER — Ambulatory Visit (INDEPENDENT_AMBULATORY_CARE_PROVIDER_SITE_OTHER): Payer: BC Managed Care – PPO | Admitting: Internal Medicine

## 2022-09-07 ENCOUNTER — Encounter: Payer: Self-pay | Admitting: Internal Medicine

## 2022-09-07 VITALS — BP 130/76 | HR 99 | Ht 66.0 in | Wt 197.0 lb

## 2022-09-07 DIAGNOSIS — R1319 Other dysphagia: Secondary | ICD-10-CM

## 2022-09-07 DIAGNOSIS — K219 Gastro-esophageal reflux disease without esophagitis: Secondary | ICD-10-CM

## 2022-09-07 MED ORDER — PANTOPRAZOLE SODIUM 40 MG PO TBEC: 40.0000 mg | DELAYED_RELEASE_TABLET | Freq: Two times a day (BID) | ORAL | 3 refills | Status: DC

## 2022-09-07 NOTE — Progress Notes (Signed)
HISTORY OF PRESENT ILLNESS:  [name] is a pleasant 61 y.o. female with multiple medical problems as listed below.  She has been followed in this office for GERD, and routine colonoscopies.  She is sent today at the urging of the hospitalist (hospitalization August 2023 for pulmonary issues which is subsequently resolved) regarding problems with reflux and dysphagia.  She is accompanied by her husband Rush Landmark.  She has a remote history of Nissen fundoplication in 6962.  She does have history of esophageal dysphagia secondary to both stricture and a component of dysmotility.  She is currently on pantoprazole 40 mg in the morning and Pepcid at night.  Despite this she has significant pyrosis.  She does report some intermittent solid food dysphagia.  She did undergo upper endoscopy most recently September 2019.  The esophagus was grossly normal.  The esophagus was empirically dilated with [place].  This seemed to help.  Evidence of fundoplication was evident.  Her last colonoscopy was March 2022.  Diminutive adenoma.  Follow-up in 7 years recommended.  GI review of systems is otherwise remarkable for nausea and occasional vomiting/regurgitation.  Review of blood work from June 15, 2022 shows unremarkable comprehensive metabolic panel.  CBC with hemoglobin 11.2.  CT scan February 2023, of the abdomen and pelvis, revealed hepatic steatosis and diverticulosis.  No acute abnormality.  REVIEW OF SYSTEMS:  All non-GI ROS negative unless otherwise stated in the HPI except for arthritis and cough  Past Medical History:  Diagnosis Date   Abnormal SPEP 04/17/2014   Acute left ankle pain 01/26/2017   ANEMIA-UNSPECIFIED 09/18/2009   Anxiety    Arthritis    Blood transfusion without reported diagnosis    Cataract    CHF (congestive heart failure) (HCC)    Chronic diastolic heart failure, NYHA class 2 (HCC)    Normal LVEDP by May 2018   COPD (chronic obstructive pulmonary disease) ([place])     Depression    DIABETES MELLITUS, TYPE II 08/21/2006   Diabetic osteomyelitis ([place]) 05/29/2015   Fatty liver    Fracture of 5th metatarsal    non union   GERD 95/28/4132   Had fundoplication   GOUT 44/10/270   Hammer toe of right foot    2-5th toes   Hx of umbilical hernia repair    HYPERLIPIDEMIA 08/21/2006   HYPERTENSION 08/21/2006   Infection of wound due to methicillin resistant Staphylococcus aureus (MRSA)    Internal hemorrhoids    Kidney problem    Multiple allergies 10/14/2016   OBESITY 06/04/2009   Onychomycosis 10/27/2015   Osteomyelitis of left foot ([place]) 05/29/2015   Osteoporosis    Pulmonary sarcoidosis ([place])    Followed locally by pulmonology, but also by Dr. Casper Harrison at [facility]   Right knee pain 01/26/2017   Vocal cord dysfunction    Wears partial dentures     Past Surgical History:  Procedure Laterality Date   ABDOMINAL HYSTERECTOMY     APPENDECTOMY     BLADDER SUSPENSION  11/11/2011   Procedure: TRANSVAGINAL TAPE (TVT) PROCEDURE;  Surgeon: Olga Millers, MD;  Location: [facility];  Service: Gynecology;  Laterality: N/A;   CAROTIDS  02/18/2011   CAROTID DUPLEX; VERTEBRALS ARE PATENT WITH ANTEGRADE FLOW. ICA/CCA RATIO 1.61 ON RIGHT AND 0.75 ON LEFT   CATARACT EXTRACTION, BILATERAL     CHOLECYSTECTOMY  1984   COLONOSCOPY  04/29/2010   [name]   CYSTOSCOPY  11/11/2011   Procedure: CYSTOSCOPY;  Surgeon: Olga Millers, MD;  [facility];  Service: Gynecology;  Laterality: N/A;   EXTUBATION (ENDOTRACHEAL) IN OR N/A 09/23/2017   Procedure: EXTUBATION (ENDOTRACHEAL) IN OR;  Surgeon: Helayne Seminole, MD;  Location: [place];  Service: ENT;  Laterality: N/A;   FIBEROPTIC LARYNGOSCOPY AND TRACHEOSCOPY N/A 09/23/2017   Procedure: FLEXIBLE FIBEROPTIC LARYNGOSCOPY;  Surgeon: Helayne Seminole, MD;  Location: [facility];  Service: ENT;  Laterality: N/A;   FRACTURE SURGERY     foot   [place] Left 10/02/2018   Procedure: HALLUX ARTHRODESIS;  Surgeon:  Edrick Kins, DPM;  Location: [facility];  Service: Podiatry;  Laterality: Left;   HAMMER TOE SURGERY Left 10/02/2018   Procedure: HAMMER TOE CORRECTION 2ND 3RD 4RD FIFTH TOE;  Surgeon: Edrick Kins, DPM;  Location: [facility];  Service: Podiatry;  Laterality: Left;   HAMMER TOE SURGERY Right 04/12/2019   Procedure: HAMMER TOE CORRECTION, 2ND, 3RD, 4TH AND 5TH TOES OF RIGHT FOOT;  Surgeon: Edrick Kins, DPM;  Location: [facility];  Service: Podiatry;  Laterality: Right;   HAMMER TOE SURGERY Left 10/25/2019   Procedure: HAMMER TOE REPAIR SECOND, THIRD, FOUTH;  Surgeon: Edrick Kins, DPM;  Location: [facility];  Service: Podiatry;  Laterality: Left;   HERNIA REPAIR     I & D EXTREMITY Left 06/27/2015   Procedure: Partial Excision Left Calcaneus, Place Antibiotic Beads, and Wound VAC;  Surgeon: Newt Minion, MD;  Location: [facility];  Service: Orthopedics;  Laterality: Left;   JOINT REPLACEMENT     KNEE ARTHROSCOPY     right   LEFT AND RIGHT HEART CATHETERIZATION WITH CORONARY ANGIOGRAM N/A 04/23/2013   Procedure: LEFT AND RIGHT HEART CATHETERIZATION WITH CORONARY ANGIOGRAM;  Surgeon: Leonie Man, MD;  Location: [facility];  Service: Cardiovascular;  Laterality: N/A;   LEFT HEART CATH AND CORONARY ANGIOGRAPHY N/A 03/11/2017   Procedure: Left Heart Cath and Coronary Angiography;  Surgeon: Sherren Mocha, MD;  Location: [facility] CV LAB; angiographically minimal CAD in the LAD otherwise normal.;  Normal LVEDP.  FALSE POSITIVE MYOVIEW   LEFT HEART CATH AND CORONARY ANGIOGRAPHY  07/20/2010   LVEF 50-55% WITH VERY MILD GLOBAL HYPOKINESIA; ESSENTIALLY NORMAL CORONARY ARTERIES; NORMAL LV FUNCTION   METATARSAL OSTEOTOMY WITH OPEN REDUCTION INTERNAL FIXATION (ORIF) METATARSAL WITH FUSION Left 04/09/2014   Procedure: LEFT FOOT FRACTURE OPEN TREATMENT METATARSAL INCLUDES INTERNAL FIXATION EACH;  Surgeon: Lorn Junes, MD;  Location: [facility];  Service: Orthopedics;  Laterality: Left;   NISSEN  FUNDOPLICATION  1610   NM MYOVIEW LTD --> FALSE POSITIVE  03/10/2017   : Moderate size "stress-induced "perfusion defect at the apex as well as "ill-defined stress-induced perfusion defect in the lateral wall.  EF 55%.  INTERMEDIATE risk. -->  FALSE POSITIVE   ORIF DISTAL FEMUR FRACTURE  08/08/2020   [facility]: ORIF of left extra-articular periprosthetic distal femur fracture with synthesis of the femur plate with cortical nonlocking screws.  (Thought to be pathologic fracture due to osteoporosis) -> after a fall   ORIF FEMUR W/ PERI-IMPLANT  08/29/2020   [facility]) revision of left femur fracture ORIF plate fixation screws; culture positive for MSSA   Right and left CARDIAC CATHETERIZATION  04/23/2013   Angiographic normal coronaries; LVEDP 20 mmHg, PCWP 12-14 mmHg, RAP 12 mmHg.; Fick CO/CI 4.9/2.2   ROTATOR CUFF REPAIR Right 12/09/2021   SHOULDER ARTHROSCOPY Left 03/14/2019   Procedure: LEFT SHOULDER ARTHROSCOPY, DEBRIDEMENT, AND DECOMPRESSION;  Surgeon: Newt Minion,  MD;  Location: [place];  Service: Orthopedics;  Laterality: Left;   TOTAL KNEE ARTHROPLASTY Right 06/29/2017   Procedure: RIGHT TOTAL KNEE ARTHROPLASTY;  Surgeon: Newt Minion, MD;  Location: [place];  Service: Orthopedics;  Laterality: Right;   TOTAL KNEE ARTHROPLASTY Left 12/07/2017   TOTAL KNEE ARTHROPLASTY Left 12/07/2017   Procedure: LEFT TOTAL KNEE ARTHROPLASTY;  Surgeon: Newt Minion, MD;  Location: [place];  Service: Orthopedics;  Laterality: Left;   TRACHEOSTOMY TUBE PLACEMENT N/A 09/20/2017   Procedure: AWAKE INTUBATION WITH ANESTHESIA WITH VIDEO ASSISTANCE;  Surgeon: Helayne Seminole, MD;  Location: [facility] [place] [facility];  Service: ENT;  Laterality: N/A;   TRANSTHORACIC ECHOCARDIOGRAM  08/2014   Normal LV size and function.  Mild LVH.  EF 55-60%.  Normal regional wall motion.  GR 1 DD.  Normal RV size and function .   TRANSTHORACIC ECHOCARDIOGRAM  08/12/2020    [facility]): Normal LV size  and function.  EF 55 to 60%.  No RWM A.  Normal filling pattern.  Normal RV.  No significant valvular disease-stenosis or regurgitation.  No vegetation.   TUBAL LIGATION     with reversal in [place]  reports that she has never smoked. She has never used smokeless tobacco. She reports that she does not drink alcohol and does not use drugs.  family history includes Asthma in her mother; Breast cancer in her mother; COPD in her mother; Coronary artery disease in her father; Diabetes in her brother, father, and mother; Emphysema in her mother; Heart attack in her maternal grandfather; Heart attack (age of onset: 38) in her father; Heart disease in her father; Heart failure in her father and mother; Hypertension in her father; Kidney disease in her mother; Lung cancer in her brother; Obesity in her father; Sarcoidosis in her maternal uncle; Sleep apnea in her father; Throat cancer in her father; Thyroid disease in her mother.  Allergies  Allergen Reactions   Methotrexate Other (See Comments)    Peri-oral and buccal lesions   Vancomycin Other (See Comments)    DOSE RELATED NEPHROTOXICITY Other reaction(s): other   Lisinopril Cough    Other reaction(s): other   Chlorhexidine Itching   Clindamycin/Lincomycin Nausea And Vomiting and Rash   Lincomycin Nausea Only and Rash    Other reaction(s): vomiting   Teflaro [Ceftaroline] Rash and Other (See Comments)    Tolerates ceftriaxone        PHYSICAL EXAMINATION: Vital signs: BP 130/76   Pulse 99   Ht _0  (1.676 m)   Wt 197 lb (89.4 kg)   BMI 31.80 kg/m   Constitutional: generally well-appearing, no acute distress Psychiatric: alert and oriented x3, cooperative Eyes: extraocular movements intact, anicteric, conjunctiva pink Mouth: oral pharynx moist, no lesions Neck: supple no lymphadenopathy Cardiovascular: heart regular rate and rhythm, no  murmur Lungs: clear to auscultation bilaterally Abdomen: soft, nontender, nondistended, no obvious ascites, no peritoneal signs, normal bowel sounds, no organomegaly Rectal: Omitted Extremities: no clubbing, cyanosis, or lower extremity edema bilaterally Skin: no lesions on visible extremities Neuro: No focal deficits.  Cranial nerves intact  ASSESSMENT:  1.  GERD.  Active symptoms despite once daily PPI and H2 receptor antagonist. 2.  Status post Nissen fundoplication 6606 3.  Dysphagia.  Known dysmotility.  Previous empiric dilation for possible underlying stricture was helpful in 2019 4.  History of nonadvanced  adenoma 2022 5.  Multiple medical problems.  Stable, including insulin requiring diabetes.   PLAN:  1.  Reflux precautions 2.  Prescribed pantoprazole 40 mg twice daily.  Medication risk reviewed 3.  Schedule upper endoscopy with probable esophageal dilation.  The patient is high risk given her comorbidities and the need to address diabetic therapies. 4.  Hold diabetic therapies the day of the procedure to avoid unwanted hypoglycemia 5.  Surveillance colonoscopy around 2029 6.  Ongoing general medical care with other specialist and PCP A total time of 40 minutes was spent preparing to see the patient, reviewing a myriad of records and data, obtaining comprehensive history, performing medically appropriate physical examination, ordering medication, ordering therapeutic endoscopic procedure, counseling and educating the patient and her husband regarding the above listed issues, and documenting clinical information in the health record

## 2022-09-07 NOTE — Patient Instructions (Signed)
_______________________________________________________  If you are age 63 or older, your body mass index should be between 23-30. Your Body mass index is 31.8 kg/m. If this is out of the aforementioned range listed, please consider follow up with your Primary Care Provider.  If you are age 77 or younger, your body mass index should be between 19-25. Your Body mass index is 31.8 kg/m. If this is out of the aformentioned range listed, please consider follow up with your Primary Care Provider.   ________________________________________________________  The [facility] providers would like to encourage you to use [facility] to communicate with providers for non-urgent requests or questions.  Due to long hold times on the telephone, sending your provider a message by [facility] may be a faster and more efficient way to get a response.  Please allow 48 business hours for a response.  Please remember that this is for non-urgent requests.  _______________________________________________________  We have sent the following medications to your pharmacy for you to pick up at your convenience:  Pantoprazole - twice a day  You have been scheduled for an endoscopy. Please follow written instructions given to you at your visit today. If you use inhalers (even only as needed), please bring them with you on the day of your procedure.

## 2022-09-08 ENCOUNTER — Other Ambulatory Visit: Payer: Self-pay | Admitting: Orthopaedic Surgery

## 2022-09-08 DIAGNOSIS — M79642 Pain in left hand: Secondary | ICD-10-CM | POA: Diagnosis not present

## 2022-09-08 DIAGNOSIS — M1812 Unilateral primary osteoarthritis of first carpometacarpal joint, left hand: Secondary | ICD-10-CM | POA: Diagnosis not present

## 2022-09-08 MED ORDER — ALLOPURINOL 300 MG PO TABS: 300.0000 mg | ORAL_TABLET | Freq: Every day | ORAL | 0 refills | Status: DC

## 2022-09-09 ENCOUNTER — Other Ambulatory Visit: Payer: Self-pay | Admitting: Family Medicine

## 2022-09-09 ENCOUNTER — Encounter: Payer: BC Managed Care – PPO | Admitting: Dietician

## 2022-09-13 ENCOUNTER — Encounter: Payer: Self-pay | Admitting: Internal Medicine

## 2022-09-13 ENCOUNTER — Ambulatory Visit (AMBULATORY_SURGERY_CENTER): Payer: BC Managed Care – PPO | Admitting: Internal Medicine

## 2022-09-13 VITALS — BP 152/75 | HR 71 | Temp 98.6°F | Resp 12 | Ht 66.0 in | Wt 197.0 lb

## 2022-09-13 DIAGNOSIS — K760 Fatty (change of) liver, not elsewhere classified: Secondary | ICD-10-CM | POA: Diagnosis not present

## 2022-09-13 DIAGNOSIS — K219 Gastro-esophageal reflux disease without esophagitis: Secondary | ICD-10-CM

## 2022-09-13 DIAGNOSIS — K222 Esophageal obstruction: Secondary | ICD-10-CM

## 2022-09-13 DIAGNOSIS — R1319 Other dysphagia: Secondary | ICD-10-CM

## 2022-09-13 DIAGNOSIS — K317 Polyp of stomach and duodenum: Secondary | ICD-10-CM

## 2022-09-13 DIAGNOSIS — I509 Heart failure, unspecified: Secondary | ICD-10-CM | POA: Diagnosis not present

## 2022-09-13 DIAGNOSIS — I10 Essential (primary) hypertension: Secondary | ICD-10-CM | POA: Diagnosis not present

## 2022-09-13 DIAGNOSIS — R1310 Dysphagia, unspecified: Secondary | ICD-10-CM | POA: Diagnosis not present

## 2022-09-13 DIAGNOSIS — E119 Type 2 diabetes mellitus without complications: Secondary | ICD-10-CM | POA: Diagnosis not present

## 2022-09-13 DIAGNOSIS — F32A Depression, unspecified: Secondary | ICD-10-CM | POA: Diagnosis not present

## 2022-09-13 MED ORDER — SODIUM CHLORIDE 0.9 % IV SOLN: 500.0000 mL | INTRAVENOUS | Status: DC

## 2022-09-13 NOTE — Progress Notes (Signed)
Pt's states no medical or surgical changes since previsit or office visit. 

## 2022-09-13 NOTE — Progress Notes (Signed)
Called to room to assist during endoscopic procedure.  Patient ID and intended procedure confirmed with present staff. Received instructions for my participation in the procedure from the performing physician.  

## 2022-09-13 NOTE — Progress Notes (Signed)
HISTORY OF PRESENT ILLNESS:   [name] is a pleasant 61 y.o. female with multiple medical problems as listed below.  She has been followed in this office for GERD, and routine colonoscopies.  She is sent today at the urging of the hospitalist (hospitalization August 2023 for pulmonary issues which is subsequently resolved) regarding problems with reflux and dysphagia.  She is accompanied by her husband Rush Landmark.   She has a remote history of Nissen fundoplication in 2500.  She does have history of esophageal dysphagia secondary to both stricture and a component of dysmotility.  She is currently on pantoprazole 40 mg in the morning and Pepcid at night.  Despite this she has significant pyrosis.  She does report some intermittent solid food dysphagia.  She did undergo upper endoscopy most recently September 2019.  The esophagus was grossly normal.  The esophagus was empirically dilated with [place].  This seemed to help.  Evidence of fundoplication was evident.  Her last colonoscopy was March 2022.  Diminutive adenoma.  Follow-up in 7 years recommended.   GI review of systems is otherwise remarkable for nausea and occasional vomiting/regurgitation.  Review of blood work from June 15, 2022 shows unremarkable comprehensive metabolic panel.  CBC with hemoglobin 11.2.  CT scan February 2023, of the abdomen and pelvis, revealed hepatic steatosis and diverticulosis.  No acute abnormality.   REVIEW OF SYSTEMS:   All non-GI ROS negative unless otherwise stated in the HPI except for arthritis and cough       Past Medical History:  Diagnosis Date   Abnormal SPEP 04/17/2014   Acute left ankle pain 01/26/2017   ANEMIA-UNSPECIFIED 09/18/2009   Anxiety     Arthritis     Blood transfusion without reported diagnosis     Cataract     CHF (congestive heart failure) (HCC)     Chronic diastolic heart failure, NYHA class 2 (HCC)      Normal LVEDP by May 2018   COPD (chronic obstructive pulmonary  disease) ([place])     Depression     DIABETES MELLITUS, TYPE II 08/21/2006   Diabetic osteomyelitis ([place]) 05/29/2015   Fatty liver     Fracture of 5th metatarsal      non union   GERD 37/01/8888    Had fundoplication   GOUT 16/94/5038   Hammer toe of right foot      2-5th toes   Hx of umbilical hernia repair     HYPERLIPIDEMIA 08/21/2006   HYPERTENSION 08/21/2006   Infection of wound due to methicillin resistant Staphylococcus aureus (MRSA)     Internal hemorrhoids     Kidney problem     Multiple allergies 10/14/2016   OBESITY 06/04/2009   Onychomycosis 10/27/2015   Osteomyelitis of left foot ([place]) 05/29/2015   Osteoporosis     Pulmonary sarcoidosis ([place])      Followed locally by pulmonology, but also by Dr. Casper Harrison at [facility]   Right knee pain 01/26/2017   Vocal cord dysfunction     Wears partial dentures             Past Surgical History:  Procedure Laterality Date   ABDOMINAL HYSTERECTOMY       APPENDECTOMY       BLADDER SUSPENSION   11/11/2011    Procedure: TRANSVAGINAL TAPE (TVT) PROCEDURE;  Surgeon: Olga Millers, MD;  Location: [facility];  Service: Gynecology;  Laterality: N/A;   CAROTIDS   02/18/2011    CAROTID DUPLEX; VERTEBRALS  ARE PATENT WITH ANTEGRADE FLOW. ICA/CCA RATIO 1.61 ON RIGHT AND 0.75 ON LEFT   CATARACT EXTRACTION, BILATERAL       CHOLECYSTECTOMY   1984   COLONOSCOPY   04/29/2010    [name]   CYSTOSCOPY   11/11/2011    Procedure: CYSTOSCOPY;  Surgeon: Olga Millers, MD;  Location: [facility];  Service: Gynecology;  Laterality: N/A;   EXTUBATION (ENDOTRACHEAL) IN OR N/A 09/23/2017    Procedure: EXTUBATION (ENDOTRACHEAL) IN OR;  Surgeon: Helayne Seminole, MD;  Location: [place];  Service: ENT;  Laterality: N/A;   FIBEROPTIC LARYNGOSCOPY AND TRACHEOSCOPY N/A 09/23/2017    Procedure: FLEXIBLE FIBEROPTIC LARYNGOSCOPY;  Surgeon: Helayne Seminole, MD;  Location: [facility];  Service: ENT;  Laterality: N/A;   FRACTURE SURGERY        foot   [place] Left 10/02/2018    Procedure: HALLUX ARTHRODESIS;  Surgeon: Edrick Kins, DPM;  Location: [facility];  Service: Podiatry;  Laterality: Left;   HAMMER TOE SURGERY Left 10/02/2018    Procedure: HAMMER TOE CORRECTION 2ND 3RD 4RD FIFTH TOE;  Surgeon: Edrick Kins, DPM;  Location: [facility];  Service: Podiatry;  Laterality: Left;   HAMMER TOE SURGERY Right 04/12/2019    Procedure: HAMMER TOE CORRECTION, 2ND, 3RD, 4TH AND 5TH TOES OF RIGHT FOOT;  Surgeon: Edrick Kins, DPM;  Location: [facility];  Service: Podiatry;  Laterality: Right;   HAMMER TOE SURGERY Left 10/25/2019    Procedure: HAMMER TOE REPAIR SECOND, THIRD, FOUTH;  Surgeon: Edrick Kins, DPM;  Location: [facility];  Service: Podiatry;  Laterality: Left;   HERNIA REPAIR       I & D EXTREMITY Left 06/27/2015    Procedure: Partial Excision Left Calcaneus, Place Antibiotic Beads, and Wound VAC;  Surgeon: Newt Minion, MD;  Location: [place];  Service: Orthopedics;  Laterality: Left;   JOINT REPLACEMENT       KNEE ARTHROSCOPY        right   LEFT AND RIGHT HEART CATHETERIZATION WITH CORONARY ANGIOGRAM N/A 04/23/2013    Procedure: LEFT AND RIGHT HEART CATHETERIZATION WITH CORONARY ANGIOGRAM;  Surgeon: Leonie Man, MD;  Location: [facility] C[facility];  Service: Cardiovascular;  Laterality: N/A;   LEFT HEART CATH AND CORONARY ANGIOGRAPHY N/A 03/11/2017    Procedure: Left Heart Cath and Coronary Angiography;  Surgeon: Sherren Mocha, MD;  Location: [place] CV LAB; angiographically minimal CAD in the LAD otherwise normal.;  Normal LVEDP.  FALSE POSITIVE MYOVIEW   LEFT HEART CATH AND CORONARY ANGIOGRAPHY   07/20/2010    LVEF 50-55% WITH VERY MILD GLOBAL HYPOKINESIA; ESSENTIALLY NORMAL CORONARY ARTERIES; NORMAL LV FUNCTION   METATARSAL OSTEOTOMY WITH OPEN REDUCTION INTERNAL FIXATION (ORIF) METATARSAL WITH FUSION Left 04/09/2014    Procedure: LEFT FOOT FRACTURE OPEN TREATMENT METATARSAL INCLUDES INTERNAL FIXATION EACH;  Surgeon: Lorn Junes, MD;   Location: [place];  Service: Orthopedics;  Laterality: Left;   NISSEN FUNDOPLICATION   0814   NM MYOVIEW LTD --> FALSE POSITIVE   03/10/2017    : Moderate size "stress-induced "perfusion defect at the apex as well as "ill-defined stress-induced perfusion defect in the lateral wall.  EF 55%.  INTERMEDIATE risk. -->  FALSE POSITIVE   ORIF DISTAL FEMUR FRACTURE   08/08/2020    [facility] [place]: ORIF of left extra-articular periprosthetic distal femur fracture with synthesis of the femur plate with cortical nonlocking screws.  (Thought to be pathologic fracture due to osteoporosis) ->  after a fall   ORIF FEMUR W/ PERI-IMPLANT   08/29/2020    [facility]) revision of left femur fracture ORIF plate fixation screws; culture positive for MSSA   Right and left CARDIAC CATHETERIZATION   04/23/2013    Angiographic normal coronaries; LVEDP 20 mmHg, PCWP 12-14 mmHg, RAP 12 mmHg.; Fick CO/CI 4.9/2.2   ROTATOR CUFF REPAIR Right 12/09/2021   SHOULDER ARTHROSCOPY Left 03/14/2019    Procedure: LEFT SHOULDER ARTHROSCOPY, DEBRIDEMENT, AND DECOMPRESSION;  Surgeon: Newt Minion, MD;  Location: [place];  Service: Orthopedics;  Laterality: Left;   TOTAL KNEE ARTHROPLASTY Right 06/29/2017    Procedure: RIGHT TOTAL KNEE ARTHROPLASTY;  Surgeon: Newt Minion, MD;  Location: [place];  Service: Orthopedics;  Laterality: Right;   TOTAL KNEE ARTHROPLASTY Left 12/07/2017   TOTAL KNEE ARTHROPLASTY Left 12/07/2017    Procedure: LEFT TOTAL KNEE ARTHROPLASTY;  Surgeon: Newt Minion, MD;  Location: [place];  Service: Orthopedics;  Laterality: Left;   TRACHEOSTOMY TUBE PLACEMENT N/A 09/20/2017    Procedure: AWAKE INTUBATION WITH ANESTHESIA WITH VIDEO ASSISTANCE;  Surgeon: Helayne Seminole, MD;  Location: [place] [facility];  Service: ENT;  Laterality: N/A;   TRANSTHORACIC ECHOCARDIOGRAM   08/2014    Normal LV size and function.  Mild LVH.  EF 55-60%.  Normal regional wall motion.  GR 1 DD.  Normal RV size  and function .   TRANSTHORACIC ECHOCARDIOGRAM   08/12/2020     [facility]): Normal LV size and function.  EF 55 to 60%.  No RWM A.  Normal filling pattern.  Normal RV.  No significant valvular disease-stenosis or regurgitation.  No vegetation.   TUBAL LIGATION        with reversal in [place]  reports that she has never smoked. She has never used smokeless tobacco. She reports that she does not drink alcohol and does not use drugs.   family history includes Asthma in her mother; Breast cancer in her mother; COPD in her mother; Coronary artery disease in her father; Diabetes in her brother, father, and mother; Emphysema in her mother; Heart attack in her maternal grandfather; Heart attack (age of onset: 39) in her father; Heart disease in her father; Heart failure in her father and mother; Hypertension in her father; Kidney disease in her mother; Lung cancer in her brother; Obesity in her father; Sarcoidosis in her maternal uncle; Sleep apnea in her father; Throat cancer in her father; Thyroid disease in her mother.        Allergies  Allergen Reactions   Methotrexate Other (See Comments)      Peri-oral and buccal lesions   Vancomycin Other (See Comments)      DOSE RELATED NEPHROTOXICITY Other reaction(s): other   Lisinopril Cough      Other reaction(s): other   Chlorhexidine Itching   Clindamycin/Lincomycin Nausea And Vomiting and Rash   Lincomycin Nausea Only and Rash      Other reaction(s): vomiting   Teflaro [Ceftaroline] Rash and Other (See Comments)      Tolerates ceftriaxone           PHYSICAL EXAMINATION: Vital signs: BP 130/76   Pulse 99   Ht '5\' 6"'$  (1.676 m)   Wt 197 lb (89.4 kg)   BMI 31.80 kg/m   Constitutional: generally well-appearing, no acute distress  Psychiatric: alert and oriented x3, cooperative Eyes: extraocular movements intact, anicteric,  conjunctiva pink Mouth: oral pharynx moist, no lesions Neck: supple no lymphadenopathy Cardiovascular: heart regular rate and rhythm, no murmur Lungs: clear to auscultation bilaterally Abdomen: soft, nontender, nondistended, no obvious ascites, no peritoneal signs, normal bowel sounds, no organomegaly Rectal: Omitted Extremities: no clubbing, cyanosis, or lower extremity edema bilaterally Skin: no lesions on visible extremities Neuro: No focal deficits.  Cranial nerves intact   ASSESSMENT:   1.  GERD.  Active symptoms despite once daily PPI and H2 receptor antagonist. 2.  Status post Nissen fundoplication 1779 3.  Dysphagia.  Known dysmotility.  Previous empiric dilation for possible underlying stricture was helpful in 2019 4.  History of nonadvanced adenoma 2022 5.  Multiple medical problems.  Stable, including insulin requiring diabetes.     PLAN:   1.  Reflux precautions 2.  Prescribed pantoprazole 40 mg twice daily.  Medication risk reviewed 3.  Schedule upper endoscopy with probable esophageal dilation.  The patient is high risk given her comorbidities and the need to address diabetic therapies. 4.  Hold diabetic therapies the day of the procedure to avoid unwanted hypoglycemia 5.  Surveillance colonoscopy around 2029 6.  Ongoing general medical care with other specialist and PCP

## 2022-09-13 NOTE — Op Note (Signed)
[facility] Patient Name: [name] Procedure Date: 09/13/2022 11:39 AM MRN: [id number] Endoscopist: Docia Chuck. [name] , MD, [id number] Age: 61 Referring MD:  Date of Birth: 1961/07/03 Gender: Female Account #: [id number] Procedure:                Upper GI endoscopy with [facility] dilation of the                            esophagus. [address] [place] Indications:              Therapeutic procedure, Dysphagia, Esophageal reflux Medicines:                Monitored Anesthesia Care Procedure:                Pre-Anesthesia Assessment:                           - Prior to the procedure, a History and Physical                            was performed, and patient medications and                            allergies were reviewed. The patient's tolerance of                            previous anesthesia was also reviewed. The risks                            and benefits of the procedure and the sedation                            options and risks were discussed with the patient.                            All questions were answered, and informed consent                            was obtained. Prior Anticoagulants: The patient has                            taken no anticoagulant or antiplatelet agents. ASA                            Grade Assessment: II - A patient with mild systemic                            disease. After reviewing the risks and benefits,                            the patient was deemed in satisfactory condition to                            undergo the procedure.  After obtaining informed consent, the endoscope was                            passed under direct vision. Throughout the                            procedure, the patient's blood pressure, pulse, and                            oxygen saturations were monitored continuously. The                            Endoscope was introduced through the mouth, and                             advanced to the second part of duodenum. The upper                            GI endoscopy was accomplished without difficulty.                            The patient tolerated the procedure well. Scope In: Scope Out: Findings:                 The esophagus was normal. No obvious stricture. The                            scope was withdrawn after completing the entire                            endoscopic survey. Dilation was performed with a                            Maloney dilator with no resistance at 78 Fr.                           The stomach revealed evidence of prior                            fundoplication and benign fundic gland type polyps.                            Stomach was otherwise normal.                           The examined duodenum was normal.                           The cardia and gastric fundus were normal on                            retroflexion. Complications:            No immediate complications. Estimated Blood Loss:     Estimated blood loss: none. Impression:  1. GERD                           2. Dysphagia. Status post esophageal dilation                           3. Prior fundoplication and benign fundic gland                            polyps                           4. Otherwise normal EGD. Recommendation:           1. Patient has a contact number available for                            emergencies. The signs and symptoms of potential                            delayed complications were discussed with the                            patient. Return to normal activities tomorrow.                            Written discharge instructions were provided to the                            patient.                           2. Post dilation diet.                           3. Continue present medications.                           4. Return to the care of your primary provider. GI                            follow-up as needed Docia Chuck.  [name], MD 09/13/2022 11:55:04 AM This report has been signed electronically.

## 2022-09-13 NOTE — Progress Notes (Signed)
Sedate, gd SR, tolerated procedure well, VSS, report to RN 

## 2022-09-13 NOTE — Patient Instructions (Signed)
Nothing by mouth until 1:00pm, clear liquids until 2:00pm, soft foods for the rest of today. Tomorrow you may resume normal diet. Continue present medications.     YOU HAD AN ENDOSCOPIC PROCEDURE TODAY AT [facility]:   Refer to the procedure report that was given to you for any specific questions about what was found during the examination.  If the procedure report does not answer your questions, please call your gastroenterologist to clarify.  If you requested that your care partner not be given the details of your procedure findings, then the procedure report has been included in a sealed envelope for you to review at your convenience later.  YOU SHOULD EXPECT: Some feelings of bloating in the abdomen. Passage of more gas than usual.  Walking can help get rid of the air that was put into your GI tract during the procedure and reduce the bloating. If you had a lower endoscopy (such as a colonoscopy or flexible sigmoidoscopy) you may notice spotting of blood in your stool or on the toilet paper. If you underwent a bowel prep for your procedure, you may not have a normal bowel movement for a few days.  Please Note:  You might notice some irritation and congestion in your nose or some drainage.  This is from the oxygen used during your procedure.  There is no need for concern and it should clear up in a day or so.  SYMPTOMS TO REPORT IMMEDIATELY:   Following upper endoscopy (EGD)  Vomiting of blood or coffee ground material  New chest pain or pain under the shoulder blades  Painful or persistently difficult swallowing  New shortness of breath  Fever of 100F or higher  Black, tarry-looking stools  For urgent or emergent issues, a gastroenterologist can be reached at any hour by calling [phone]. Do not use MyChart messaging for urgent concerns.    DIET:  Nothing by mouth until 1:00pm, clear liquids until 2:00pm, soft foods for the rest of today. Tomorrow you may proceed  to your regular diet.  Drink plenty of fluids but you should avoid alcoholic beverages for 24 hours.  ACTIVITY:  You should plan to take it easy for the rest of today and you should NOT DRIVE or use heavy machinery until tomorrow (because of the sedation medicines used during the test).    FOLLOW UP: Our staff will call the number listed on your records the next business day following your procedure.  We will call around 7:15- 8:00 am to check on you and address any questions or concerns that you may have regarding the information given to you following your procedure. If we do not reach you, we will leave a message.     If any biopsies were taken you will be contacted by phone or by letter within the next 1-3 weeks.  Please call us at [phone] if you have not heard about the biopsies in 3 weeks.    SIGNATURES/CONFIDENTIALITY: You and/or your care partner have signed paperwork which will be entered into your electronic medical record.  These signatures attest to the fact that that the information above on your After Visit Summary has been reviewed and is understood.  Full responsibility of the confidentiality of this discharge information lies with you and/or your care-partner.

## 2022-09-14 ENCOUNTER — Telehealth: Payer: Self-pay

## 2022-09-14 NOTE — Telephone Encounter (Signed)
  Follow up Call-     09/13/2022   10:58 AM 01/06/2021    7:04 AM  Call back number  Post procedure Call Back phone  # [phone]  Permission to leave phone message Yes Yes     Patient questions:  Do you have a fever, pain , or abdominal swelling? No. Pain Score  0 *  Have you tolerated food without any problems? Yes.    Have you been able to return to your normal activities? Yes.    Do you have any questions about your discharge instructions: Diet   No. Medications  No. Follow up visit  No.  Do you have questions or concerns about your Care? No.  Actions: * If pain score is 4 or above: No action needed, pain <4.

## 2022-09-16 ENCOUNTER — Encounter: Payer: BC Managed Care – PPO | Admitting: Internal Medicine

## 2022-09-23 DIAGNOSIS — M1712 Unilateral primary osteoarthritis, left knee: Secondary | ICD-10-CM | POA: Diagnosis not present

## 2022-09-27 IMAGING — CT CT ABD-PELV W/ CM
2 of 5 series · 16 of 46 positions shown, 18 images · IV contrast (agent unspecified)
Comparison: CT 02/11/2020

CLINICAL DATA: Abdomen pain

EXAM:
CT ABDOMEN AND PELVIS WITH CONTRAST
TECHNIQUE: Multidetector CT imaging of the abdomen and pelvis was performed
using the standard protocol following bolus administration of
intravenous contrast.

[Series 2: abd pel w · axial · 0.95mm/px · z∈[-121,+344]mm · 13 of 105 slices shown, 15 images]
[im 6/105  soft-tissue]
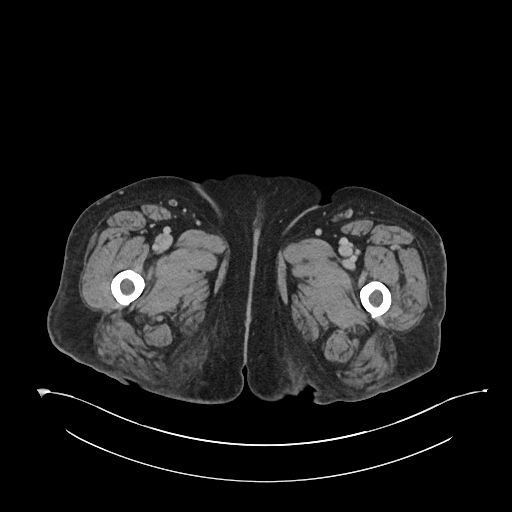
[im 6/105  bone]
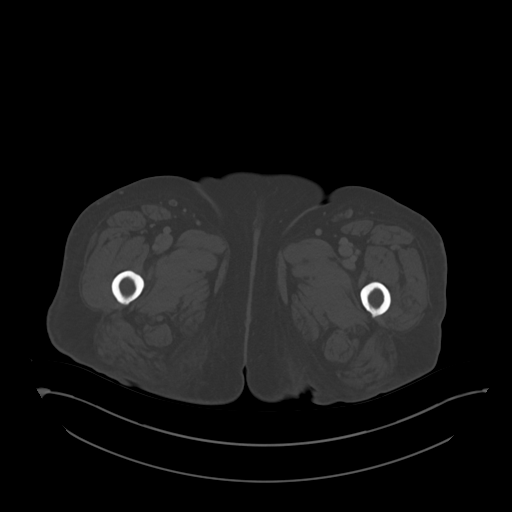
[im 17/105  soft-tissue]
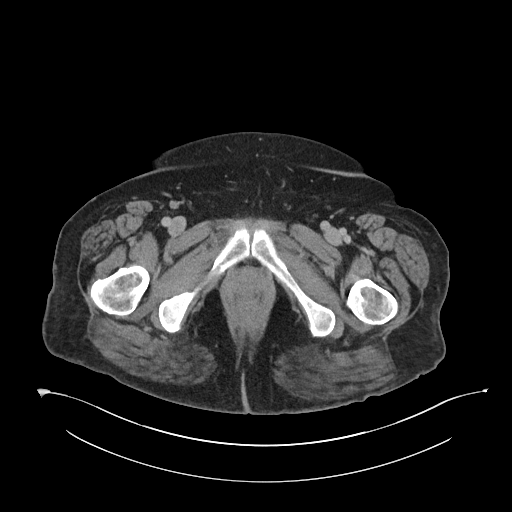
[im 22/105  soft-tissue]
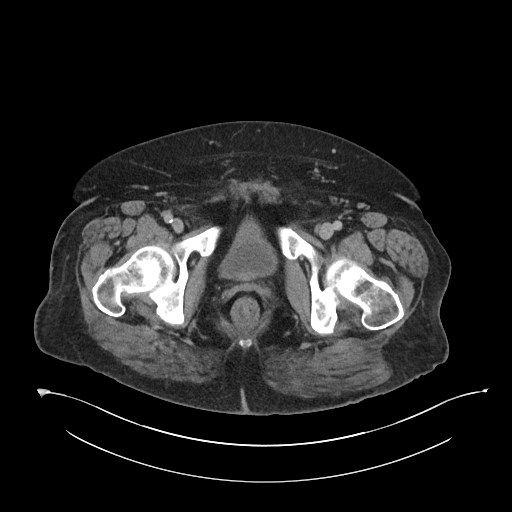
[im 28/105  soft-tissue]
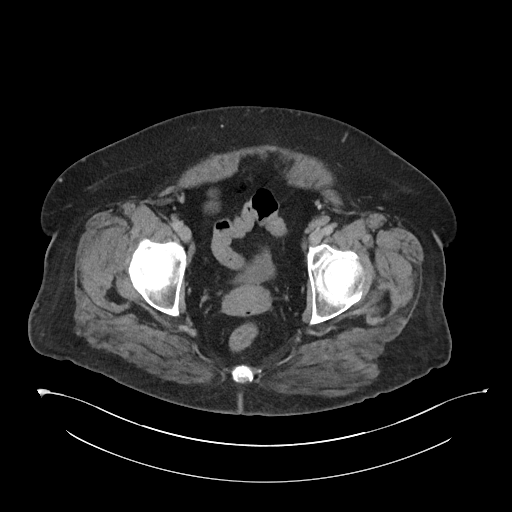
[im 39/105  soft-tissue]
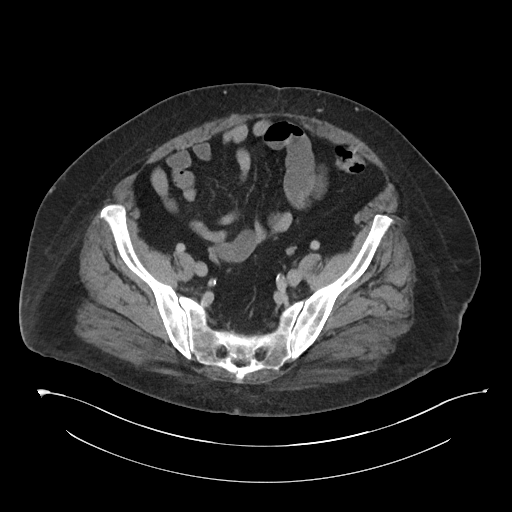
[im 44/105  soft-tissue]
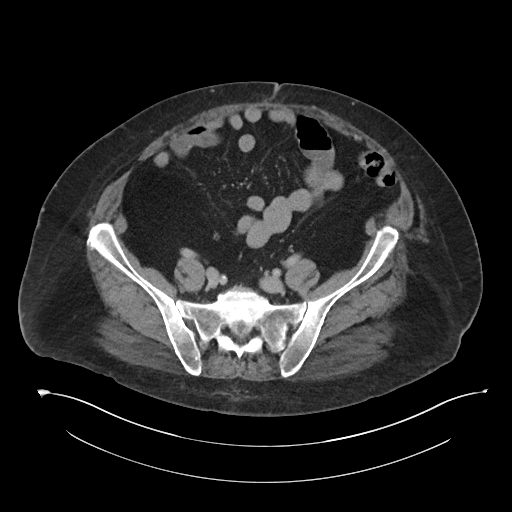
[im 55/105  soft-tissue]
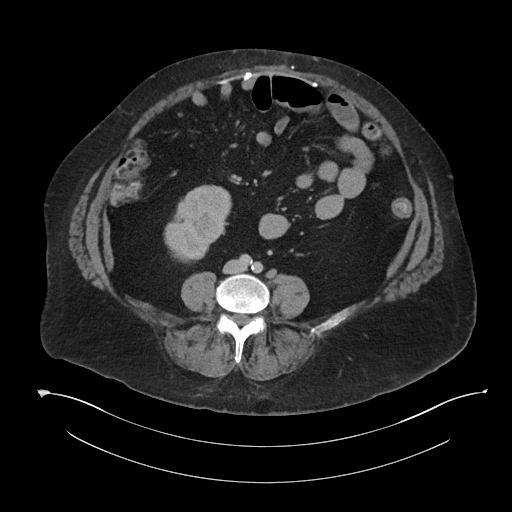
[im 61/105  soft-tissue]
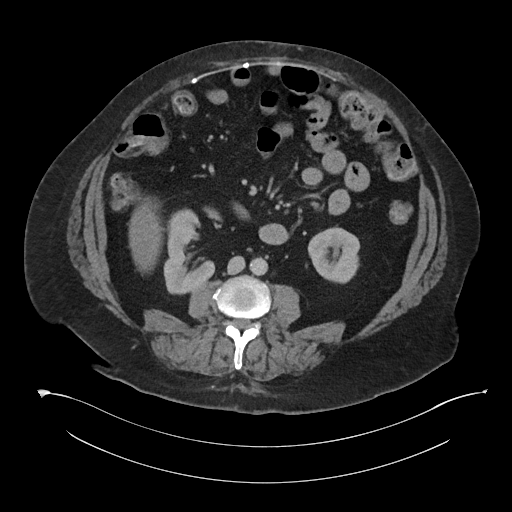
[im 66/105  soft-tissue]
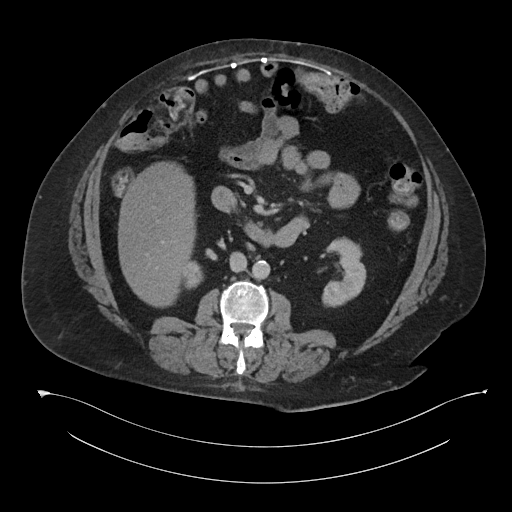
[im 66/105  bone]
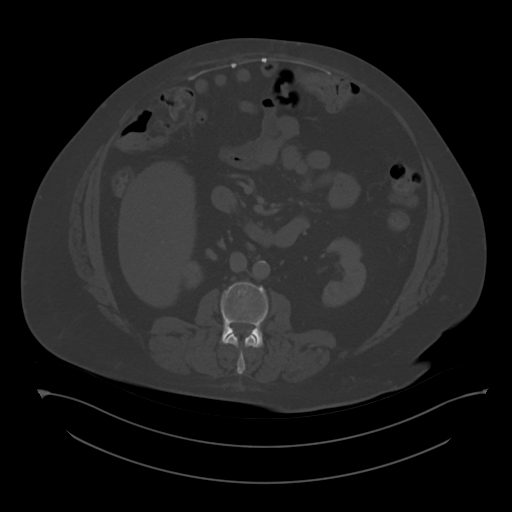
[im 77/105  soft-tissue]
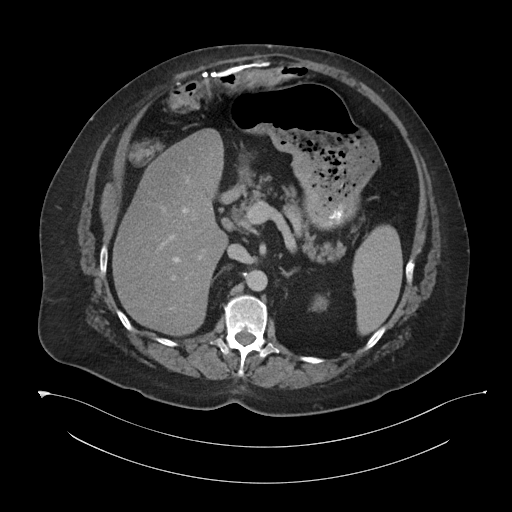
[im 83/105  soft-tissue]
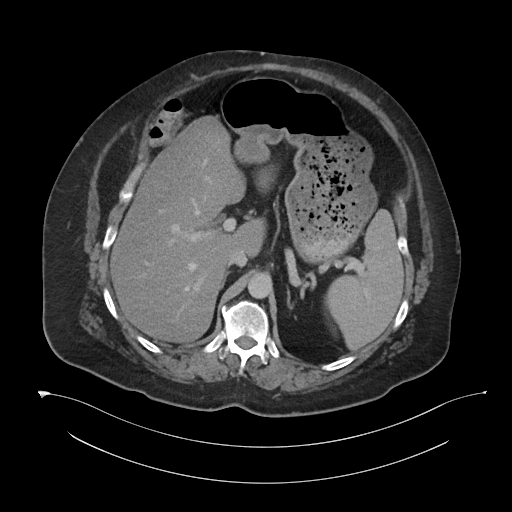
[im 88/105  soft-tissue]
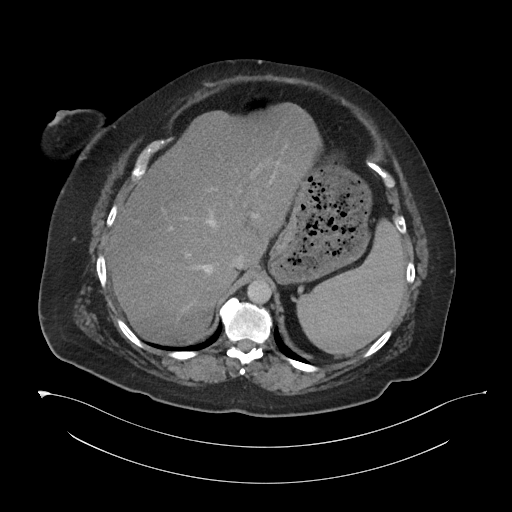
[im 99/105  soft-tissue]
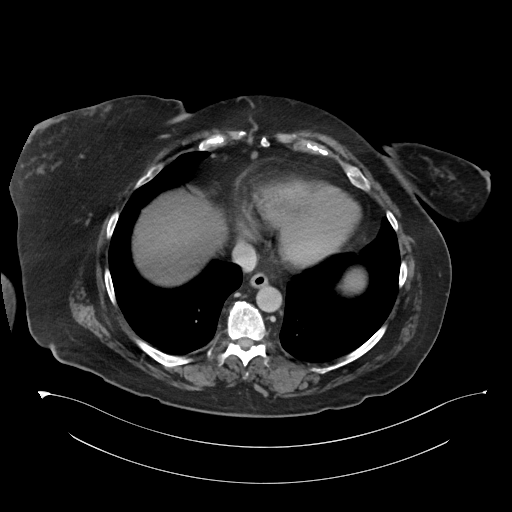

[Series 5: coronal · coronal · 0.92mm/px · 3 of 127 slices shown]
[im 43/127  soft-tissue]
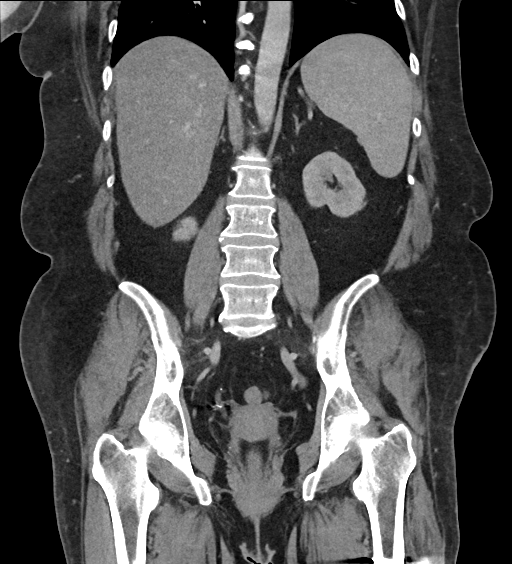
[im 57/127  soft-tissue]
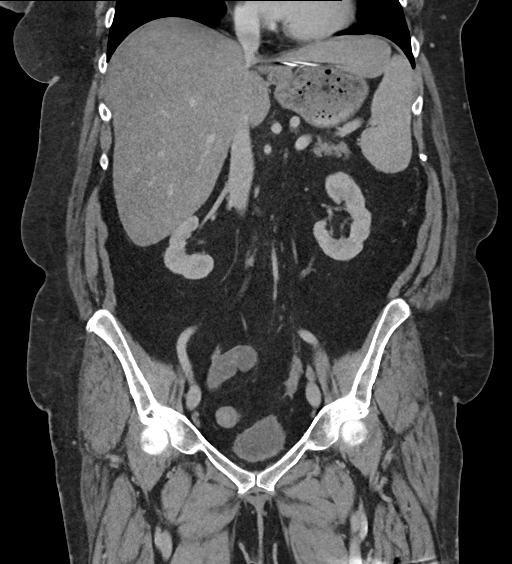
[im 71/127  soft-tissue]
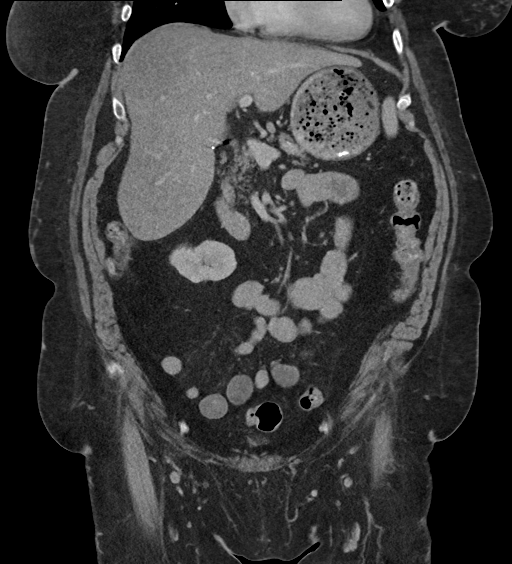

[16 of 46 positions shown; findings below may reference images not displayed]

RADIATION DOSE REDUCTION: This exam was performed according to the
departmental dose-optimization program which includes automated
exposure control, adjustment of the mA and/or kV according to
patient size and/or use of iterative reconstruction technique.

CONTRAST:  75mL OMNIPAQUE IOHEXOL 300 MG/ML  SOLN
FINDINGS: Lower chest: Lung bases demonstrate no acute consolidation or
effusion. Normal cardiac size.

Hepatobiliary: Hepatic steatosis. Status post cholecystectomy. No
biliary dilatation

Pancreas: Unremarkable. No pancreatic ductal dilatation or
surrounding inflammatory changes.

Spleen: Borderline to slightly enlarged measuring 14 cm craniocaudad

Adrenals/Urinary Tract: Adrenal glands are unremarkable. Kidneys are
normal, without renal calculi, focal lesion, or hydronephrosis.
Bladder is unremarkable.

Stomach/Bowel: Stomach nonenlarged. No dilated small bowel. No acute
bowel wall thickening. Mild diverticular disease of the sigmoid
colon

Vascular/Lymphatic: Mild aortic atherosclerosis. No aneurysm. No
suspicious lymph nodes

Reproductive: Status post hysterectomy. No adnexal masses.

Other: Negative for pelvic effusion or free air. Evidence of prior
ventral hernia repair

Musculoskeletal: No acute osseous abnormality.
IMPRESSION: 1. No CT evidence for acute intra-abdominal or pelvic abnormality.
2. Hepatic steatosis
3. Mild diverticular disease of the sigmoid colon without acute
inflammatory process.

## 2022-09-29 DIAGNOSIS — M659 Synovitis and tenosynovitis, unspecified: Secondary | ICD-10-CM | POA: Diagnosis not present

## 2022-09-29 DIAGNOSIS — M1812 Unilateral primary osteoarthritis of first carpometacarpal joint, left hand: Secondary | ICD-10-CM | POA: Diagnosis not present

## 2022-09-30 ENCOUNTER — Other Ambulatory Visit: Payer: Self-pay | Admitting: Family Medicine

## 2022-10-12 ENCOUNTER — Other Ambulatory Visit: Payer: BC Managed Care – PPO

## 2022-10-12 ENCOUNTER — Other Ambulatory Visit (INDEPENDENT_AMBULATORY_CARE_PROVIDER_SITE_OTHER): Payer: Medicare Other

## 2022-10-12 DIAGNOSIS — Z794 Long term (current) use of insulin: Secondary | ICD-10-CM

## 2022-10-12 DIAGNOSIS — E1165 Type 2 diabetes mellitus with hyperglycemia: Secondary | ICD-10-CM | POA: Diagnosis not present

## 2022-10-12 DIAGNOSIS — E782 Mixed hyperlipidemia: Secondary | ICD-10-CM

## 2022-10-12 LAB — BASIC METABOLIC PANEL
BUN: 23 mg/dL (ref 6–23)
CO2: 30 mEq/L (ref 19–32)
Calcium: 10 mg/dL (ref 8.4–10.5)
Chloride: 102 mEq/L (ref 96–112)
Creatinine, Ser: 1.42 mg/dL — ABNORMAL HIGH (ref 0.40–1.20)
GFR: 39.45 mL/min — ABNORMAL LOW (ref >60.00)
Glucose, Bld: 134 mg/dL — ABNORMAL HIGH (ref 70–99)
Potassium: 5.3 mEq/L — ABNORMAL HIGH (ref 3.5–5.1)
Sodium: 140 mEq/L (ref 135–145)

## 2022-10-12 LAB — LIPID PANEL
Cholesterol: 137 mg/dL (ref 0–200)
HDL: 50.6 mg/dL (ref >39.00)
LDL Cholesterol: 56 mg/dL (ref 0–99)
NonHDL: 86.68
Total CHOL/HDL Ratio: 3
Triglycerides: 154 mg/dL — ABNORMAL HIGH (ref 0.0–149.0)
VLDL: 30.8 mg/dL (ref 0.0–40.0)

## 2022-10-12 LAB — HEMOGLOBIN A1C: Hgb A1c MFr Bld: 8.1 % — ABNORMAL HIGH (ref 4.6–6.5)

## 2022-10-13 ENCOUNTER — Encounter: Payer: BC Managed Care – PPO | Admitting: Registered"

## 2022-10-13 LAB — FRUCTOSAMINE: Fructosamine: 270 umol/L (ref 0–285)

## 2022-10-14 ENCOUNTER — Ambulatory Visit (INDEPENDENT_AMBULATORY_CARE_PROVIDER_SITE_OTHER): Payer: BC Managed Care – PPO | Admitting: Pulmonary Disease

## 2022-10-14 ENCOUNTER — Ambulatory Visit (INDEPENDENT_AMBULATORY_CARE_PROVIDER_SITE_OTHER): Payer: BC Managed Care – PPO | Admitting: Endocrinology

## 2022-10-14 ENCOUNTER — Ambulatory Visit: Payer: BC Managed Care – PPO | Admitting: Endocrinology

## 2022-10-14 ENCOUNTER — Encounter: Payer: Self-pay | Admitting: Endocrinology

## 2022-10-14 ENCOUNTER — Ambulatory Visit: Payer: BC Managed Care – PPO

## 2022-10-14 VITALS — BP 156/72 | HR 80 | Ht 66.0 in | Wt 214.0 lb

## 2022-10-14 DIAGNOSIS — E1165 Type 2 diabetes mellitus with hyperglycemia: Secondary | ICD-10-CM | POA: Diagnosis not present

## 2022-10-14 DIAGNOSIS — E782 Mixed hyperlipidemia: Secondary | ICD-10-CM

## 2022-10-14 DIAGNOSIS — R0602 Shortness of breath: Secondary | ICD-10-CM

## 2022-10-14 DIAGNOSIS — Z794 Long term (current) use of insulin: Secondary | ICD-10-CM

## 2022-10-14 DIAGNOSIS — G4730 Sleep apnea, unspecified: Secondary | ICD-10-CM

## 2022-10-14 DIAGNOSIS — G4733 Obstructive sleep apnea (adult) (pediatric): Secondary | ICD-10-CM | POA: Diagnosis not present

## 2022-10-14 LAB — PULMONARY FUNCTION TEST
DL/VA % pred: 117 %
DL/VA: 4.83 ml/min/mmHg/L
DLCO cor % pred: 94 %
DLCO cor: 20.21 ml/min/mmHg
DLCO unc % pred: 94 %
DLCO unc: 20.21 ml/min/mmHg
FEF 25-75 Post: 3.78 L/sec
FEF 25-75 Pre: 2.11 L/sec
FEF2575-%Change-Post: 78 %
FEF2575-%Pred-Post: 161 %
FEF2575-%Pred-Pre: 90 %
FEV1-%Change-Post: 16 %
FEV1-%Pred-Post: 84 %
FEV1-%Pred-Pre: 72 %
FEV1-Post: 2.26 L
FEV1-Pre: 1.93 L
FEV1FVC-%Change-Post: 3 %
FEV1FVC-%Pred-Pre: 107 %
FEV6-%Change-Post: 12 %
FEV6-%Pred-Post: 78 %
FEV6-%Pred-Pre: 69 %
FEV6-Post: 2.61 L
FEV6-Pre: 2.32 L
FEV6FVC-%Change-Post: 0 %
FEV6FVC-%Pred-Post: 103 %
FEV6FVC-%Pred-Pre: 103 %
FVC-%Change-Post: 12 %
FVC-%Pred-Post: 75 %
FVC-%Pred-Pre: 66 %
FVC-Post: 2.61 L
FVC-Pre: 2.32 L
Post FEV1/FVC ratio: 86 %
Post FEV6/FVC ratio: 100 %
Pre FEV1/FVC ratio: 83 %
Pre FEV6/FVC Ratio: 100 %
RV % pred: 92 %
RV: 1.99 L
TLC % pred: 93 %
TLC: 5.03 L

## 2022-10-14 MED ORDER — DEXCOM G7 SENSOR MISC: 1.0000 | 3 refills | Status: DC

## 2022-10-14 NOTE — Progress Notes (Signed)
Full PFT performed today. °

## 2022-10-14 NOTE — Patient Instructions (Signed)
Full PFT performed today. °

## 2022-10-14 NOTE — Patient Instructions (Addendum)
Novolog 8 at breakfast, 10 at lunch and 14 at dinner  Lantus in am, start with 22 units  Metformin 3 daily  Stop Meloxicam if able  Exercise

## 2022-10-14 NOTE — Progress Notes (Signed)
Patient ID: [name], female   DOB: 1960-11-04, 62 y.o.   MRN: [id number]           Reason for Appointment: Type II Diabetes follow-up   History of Present Illness   Diagnosis date: 2007 ?  Previous history:  Non-insulin hypoglycemic drugs previously used: Ozempic, metformin Insulin was started a few years after diagnosis  A1c range in the last few years is:6.0-9.9 and since 7/22 consistently higher  Recent history:     Non-insulin hypoglycemic drugs: Metformin 4098 mg a day, Trulicity 4.5 mg weekly     Insulin regimen:  Lantus 18 units at bedtime,  Novolog 15 units after supper     Side effects from medications: Ozempic: nausea  Current self management, blood sugar patterns and problems identified:  A1c is 8.1 was 8.4 She has not followed any instructions for her insulin changes since her last visit She was told to take her Lantus in the evening instead of morning and NovoLog at suppertime but she is still following her previous routine Also was told to start NovoLog at breakfast Although she has started using the freestyle libre she has had technical difficulties with this and currently does not have any replacement sensors, also it may not stick long enough for her to use it on her arm Previously would be checking blood sugars only in the mornings and these will be variable On libre recording from early December shows low normal readings during the night and progressively higher readings during the day until bedtime Also has significant variability likely based on types of meals that she is having She has consistently high postprandial readings after most meals especially lunch and dinner Rarely has had low normal readings around 12-1 AM She has been taking her Trulicity fairly regularly Has not done any exercise However her weight appears to be less than in July last She is frequently getting cereal for breakfast  Exercise: She was previously going to the gym  regularly and using exercise bike riding 5 miles are also doing resistance training for her arms   Diet management: Bfst cereal (cheerios) or eggs     Hypoglycemia: None recently, in the past she had a reading in the 60s once  Freestyle libre analysis from December 6 through December 19 as follows  Overnight blood sugars are decreasing progressively after about 11 PM with lowest readings around 2-3 AM with only once low normal readings Blood sugars are progressively higher between about 3 AM until breakfast time During the day her blood sugars may fluctuate significantly in the afternoon but generally appear to be consistently going higher until 6 PM Postprandial readings are consistently above target and highest readings overall are between 4-6 PM  CGM use % of time 93  2-week average/GV 204/29  Time in range      35%  % Time Above 180 42  % Time above 250 23  % Time Below 70      PRE-MEAL Fasting Lunch Dinner 2-4 AM Overall  Glucose range:       Averages: 177   129    POST-MEAL PC Breakfast PC Lunch PC Dinner  Glucose range:     Averages: 131 218 240     Dietician visit: Most recent: Years ago   Weight control:  Wt Readings from Last 3 Encounters:  10/14/22 214 lb (97.1 kg)  09/13/22 197 lb (89.4 kg)  09/07/22 197 lb (89.4 kg)  Diabetes labs:  Lab Results  Component Value Date   HGBA1C 8.1 (H) 10/12/2022   HGBA1C 8.4 (H) 04/29/2022   HGBA1C 8.8 (A) 02/02/2022   Lab Results  Component Value Date   MICROALBUR 2.0 (H) 04/29/2022   LDLCALC 56 10/12/2022   CREATININE 1.42 (H) 10/12/2022    Lab Results  Component Value Date   FRUCTOSAMINE 270 10/12/2022     Allergies as of 10/14/2022       Reactions   Methotrexate Other (See Comments)   Peri-oral and buccal lesions   Vancomycin Other (See Comments)   DOSE RELATED NEPHROTOXICITY Other reaction(s): other   Lisinopril Cough   Other reaction(s): other   Chlorhexidine Itching    Clindamycin/lincomycin Nausea And Vomiting, Rash   Lincomycin Nausea Only, Rash   Other reaction(s): vomiting   Teflaro [ceftaroline] Rash, Other (See Comments)   Tolerates ceftriaxone         Medication List        Accurate as of October 14, 2022  9:00 PM. If you have any questions, ask your nurse or doctor.          albuterol (2.5 MG/3ML) 0.083% nebulizer solution Commonly known as: PROVENTIL Take 2.5 mg by nebulization every 6 (six) hours as needed for wheezing or shortness of breath.   albuterol 108 (90 Base) MCG/ACT inhaler Commonly known as: VENTOLIN HFA Inhale 1-2 puffs into the lungs every 6 (six) hours as needed for wheezing or shortness of breath.   allopurinol 300 MG tablet Commonly known as: ZYLOPRIM Take 1 tablet (300 mg total) by mouth daily.   amoxicillin-clavulanate 250-125 MG tablet Commonly known as: AUGMENTIN Take 1 tablet by mouth 3 (three) times daily.   aspirin EC 81 MG tablet Take 1 tablet (81 mg total) by mouth daily.   atorvastatin 40 MG tablet Commonly known as: LIPITOR TAKE 1 TABLET BY MOUTH EVERY DAY   benzonatate 100 MG capsule Commonly known as: Tessalon Perles Take 2 capsules (200 mg total) by mouth 3 (three) times daily as needed for cough.   Breo Ellipta 200-25 MCG/ACT Aepb Generic drug: fluticasone furoate-vilanterol TAKE 1 PUFF BY MOUTH EVERY DAY What changed: See the new instructions.   buPROPion 300 MG 24 hr tablet Commonly known as: WELLBUTRIN XL TAKE 1 TABLET BY MOUTH EVERY DAY   carvedilol 25 MG tablet Commonly known as: COREG Take 1 tablet by mouth daily.   chlorpheniramine-HYDROcodone 10-8 MG/5ML Commonly known as: TUSSIONEX Take 5 mLs by mouth at bedtime as needed for cough (do not drive for 8 hours after taking).   Cholecalciferol 25 MCG (1000 UT) capsule Take 1,000 Units by mouth in the morning and at bedtime.   Contour Next Test test strip Generic drug: glucose blood 1 each by Other route 4 (four) times  daily. And lancets 4/day   Dexcom G7 Sensor Misc 1 Device by Does not apply route as directed. Change sensor every 10 days What changed:  when to take this additional instructions Changed by: Elayne Snare, MD   dexlansoprazole 60 MG capsule Commonly known as: Dexilant Take 1 capsule (60 mg total) by mouth daily.   diclofenac Sodium 1 % Gel Commonly known as: VOLTAREN Apply 1 application topically as needed (pain.).   diltiazem 120 MG 24 hr capsule Commonly known as: CARDIZEM CD TAKE 1 CAPSULE BY MOUTH EVERY DAY   Easy Mini Eject Lancing Device Misc Use as directed 4 times daily to test blood sugar   famotidine 20 MG tablet Commonly known  as: Pepcid Take 1 tablet (20 mg total) by mouth at bedtime.   fenofibrate 160 MG tablet TAKE 1 TABLET BY MOUTH EVERY DAY   fluticasone 50 MCG/ACT nasal spray Commonly known as: FLONASE Place 2 sprays into both nostrils daily as needed for allergies.   furosemide 40 MG tablet Commonly known as: LASIX Take 1 tablet (40 mg total) by mouth daily.   gabapentin 300 MG capsule Commonly known as: NEURONTIN Take at night in addition to the '100mg'$  gabapentin to get '400mg'$  at night and '100mg'$  in the morning and at mid day.   HYDROcodone-acetaminophen 5-325 MG tablet Commonly known as: NORCO/VICODIN Take 1 tablet by mouth every 6 (six) hours as needed.   ipratropium 0.02 % nebulizer solution Commonly known as: ATROVENT Take 2.5 mLs (0.5 mg total) by nebulization every 6 (six) hours as needed for wheezing or shortness of breath.   ipratropium 0.06 % nasal spray Commonly known as: ATROVENT PLACE 2 SPRAYS INTO BOTH NOSTRILS 4 TIMES DAILY.   Lantus SoloStar 100 UNIT/ML Solostar Pen Generic drug: insulin glargine Inject 16 Units into the skin every morning. What changed: how much to take   Linzess 290 MCG Caps capsule Generic drug: linaclotide TAKE 1 CAPSULE BY MOUTH DAILY BEFORE BREAKFAST. What changed: See the new instructions.    meloxicam 15 MG tablet Commonly known as: MOBIC Take 1 tablet (15 mg total) by mouth daily.   metFORMIN 500 MG 24 hr tablet Commonly known as: GLUCOPHAGE-XR Take 4 tablets (2,000 mg total) by mouth daily with breakfast.   Microlet Lancets Misc 1 each by Does not apply route 2 (two) times daily. E11.9   nitrofurantoin (macrocrystal-monohydrate) 100 MG capsule Commonly known as: Macrobid Take 1 capsule (100 mg total) by mouth 2 (two) times daily.   NovoLOG FlexPen 100 UNIT/ML FlexPen Generic drug: insulin aspart Inject 15 Units into the skin daily with supper.   ondansetron 4 MG disintegrating tablet Commonly known as: ZOFRAN-ODT Take 1 tablet (4 mg total) by mouth every 8 (eight) hours as needed for nausea or vomiting.   pantoprazole 40 MG tablet Commonly known as: PROTONIX Take 1 tablet (40 mg total) by mouth 2 (two) times daily.   Pen Needles 32G X 4 MM Misc 1 each by Does not apply route 4 (four) times daily. E11.9   potassium chloride SA 20 MEQ tablet Commonly known as: Klor-Con M20 Take 1.5 tablets (30 mEq total) by mouth daily.   predniSONE 20 MG tablet Commonly known as: DELTASONE Take 2 pills in the morning with breakfast for 4 days, then 1 pill for 3 days   sulfamethoxazole-trimethoprim 200-40 MG/5ML suspension Commonly known as: BACTRIM Take by mouth 2 (two) times daily.   telmisartan 20 MG tablet Commonly known as: MICARDIS TAKE 1 TABLET BY MOUTH EVERY DAY   Trulicity 4.5 XT/0.6YI Sopn Generic drug: Dulaglutide Inject 4.5 mg as directed once a week.   venlafaxine XR 75 MG 24 hr capsule Commonly known as: EFFEXOR-XR TAKE 1 CAPSULE BY MOUTH TWICE A DAY        Allergies:  Allergies  Allergen Reactions   Methotrexate Other (See Comments)    Peri-oral and buccal lesions   Vancomycin Other (See Comments)    DOSE RELATED NEPHROTOXICITY Other reaction(s): other   Lisinopril Cough    Other reaction(s): other   Chlorhexidine Itching    Clindamycin/Lincomycin Nausea And Vomiting and Rash   Lincomycin Nausea Only and Rash    Other reaction(s): vomiting   Teflaro [Ceftaroline] Rash and  Other (See Comments)    Tolerates ceftriaxone     Past Medical History:  Diagnosis Date   Abnormal SPEP 04/17/2014   Acute left ankle pain 01/26/2017   ANEMIA-UNSPECIFIED 09/18/2009   Anxiety    Arthritis    Blood transfusion without reported diagnosis    Cataract    CHF (congestive heart failure) (HCC)    Chronic diastolic heart failure, NYHA class 2 (HCC)    Normal LVEDP by May 2018   COPD (chronic obstructive pulmonary disease) ([place])    Depression    DIABETES MELLITUS, TYPE II 08/21/2006   Diabetic osteomyelitis ([place]) 05/29/2015   Fatty liver    Fracture of 5th metatarsal    non union   GERD 59/16/3846   Had fundoplication   GOUT 65/99/3570   Hammer toe of right foot    2-5th toes   Hx of umbilical hernia repair    HYPERLIPIDEMIA 08/21/2006   HYPERTENSION 08/21/2006   Infection of wound due to methicillin resistant Staphylococcus aureus (MRSA)    Internal hemorrhoids    Kidney problem    Multiple allergies 10/14/2016   OBESITY 06/04/2009   Onychomycosis 10/27/2015   Osteomyelitis of left foot ([place]) 05/29/2015   Osteoporosis    Pulmonary sarcoidosis ([place])    Followed locally by pulmonology, but also by Dr. Casper Harrison at [facility]   Right knee pain 01/26/2017   Vocal cord dysfunction    Wears partial dentures     Past Surgical History:  Procedure Laterality Date   ABDOMINAL HYSTERECTOMY     APPENDECTOMY     BLADDER SUSPENSION  11/11/2011   Procedure: TRANSVAGINAL TAPE (TVT) PROCEDURE;  Surgeon: Olga Millers, MD;  Location: [facility];  Service: Gynecology;  Laterality: N/A;   CAROTIDS  02/18/2011   CAROTID DUPLEX; VERTEBRALS ARE PATENT WITH ANTEGRADE FLOW. ICA/CCA RATIO 1.61 ON RIGHT AND 0.75 ON LEFT   CATARACT EXTRACTION, BILATERAL     CHOLECYSTECTOMY  1984   COLONOSCOPY  04/29/2010   [name]   CYSTOSCOPY   11/11/2011   Procedure: CYSTOSCOPY;  Surgeon: Olga Millers, MD;  Location: [facility];  Service: Gynecology;  Laterality: N/A;   EXTUBATION (ENDOTRACHEAL) IN OR N/A 09/23/2017   Procedure: EXTUBATION (ENDOTRACHEAL) IN OR;  Surgeon: Helayne Seminole, MD;  Location: [place] [place];  Service: ENT;  Laterality: N/A;   FIBEROPTIC LARYNGOSCOPY AND TRACHEOSCOPY N/A 09/23/2017   Procedure: FLEXIBLE FIBEROPTIC LARYNGOSCOPY;  Surgeon: Helayne Seminole, MD;  Location: [facility];  Service: ENT;  Laterality: N/A;   FRACTURE SURGERY     foot   [place] Left 10/02/2018   Procedure: HALLUX ARTHRODESIS;  Surgeon: Edrick Kins, DPM;  Location: [facility];  Service: Podiatry;  Laterality: Left;   HAMMER TOE SURGERY Left 10/02/2018   Procedure: HAMMER TOE CORRECTION 2ND 3RD 4RD FIFTH TOE;  Surgeon: Edrick Kins, DPM;  Location: [facility];  Service: Podiatry;  Laterality: Left;   HAMMER TOE SURGERY Right 04/12/2019   Procedure: HAMMER TOE CORRECTION, 2ND, 3RD, 4TH AND 5TH TOES OF RIGHT FOOT;  Surgeon: Edrick Kins, DPM;  Location: [place];  Service: Podiatry;  Laterality: Right;   HAMMER TOE SURGERY Left 10/25/2019   Procedure: HAMMER TOE REPAIR SECOND, THIRD, FOUTH;  Surgeon: Edrick Kins, DPM;  Location: [facility];  Service: Podiatry;  Laterality: Left;   HERNIA REPAIR     I & D EXTREMITY Left 06/27/2015   Procedure: Partial Excision Left Calcaneus, Place Antibiotic Beads, and Wound VAC;  Surgeon: Beverely Low  Fernanda Drum, MD;  Location: [facility];  Service: Orthopedics;  Laterality: Left;   JOINT REPLACEMENT     KNEE ARTHROSCOPY     right   LEFT AND RIGHT HEART CATHETERIZATION WITH CORONARY ANGIOGRAM N/A 04/23/2013   Procedure: LEFT AND RIGHT HEART CATHETERIZATION WITH CORONARY ANGIOGRAM;  Surgeon: Leonie Man, MD;  Location: [facility];  Service: Cardiovascular;  Laterality: N/A;   LEFT HEART CATH AND CORONARY ANGIOGRAPHY N/A 03/11/2017   Procedure: Left Heart Cath and Coronary Angiography;  Surgeon: Sherren Mocha, MD;   Location: [facility]; angiographically minimal CAD in the LAD otherwise normal.;  Normal LVEDP.  FALSE POSITIVE MYOVIEW   LEFT HEART CATH AND CORONARY ANGIOGRAPHY  07/20/2010   LVEF 50-55% WITH VERY MILD GLOBAL HYPOKINESIA; ESSENTIALLY NORMAL CORONARY ARTERIES; NORMAL LV FUNCTION   METATARSAL OSTEOTOMY WITH OPEN REDUCTION INTERNAL FIXATION (ORIF) METATARSAL WITH FUSION Left 04/09/2014   Procedure: LEFT FOOT FRACTURE OPEN TREATMENT METATARSAL INCLUDES INTERNAL FIXATION EACH;  Surgeon: Lorn Junes, MD;  Location: [facility];  Service: Orthopedics;  Laterality: Left;   NISSEN FUNDOPLICATION  8592   NM MYOVIEW LTD --> FALSE POSITIVE  03/10/2017   : Moderate size "stress-induced "perfusion defect at the apex as well as "ill-defined stress-induced perfusion defect in the lateral wall.  EF 55%.  INTERMEDIATE risk. -->  FALSE POSITIVE   ORIF DISTAL FEMUR FRACTURE  08/08/2020   [facility]: ORIF of left extra-articular periprosthetic distal femur fracture with synthesis of the femur plate with cortical nonlocking screws.  (Thought to be pathologic fracture due to osteoporosis) -> after a fall   ORIF FEMUR W/ PERI-IMPLANT  08/29/2020   [facility]) revision of left femur fracture ORIF plate fixation screws; culture positive for MSSA   Right and left CARDIAC CATHETERIZATION  04/23/2013   Angiographic normal coronaries; LVEDP 20 mmHg, PCWP 12-14 mmHg, RAP 12 mmHg.; Fick CO/CI 4.9/2.2   ROTATOR CUFF REPAIR Right 12/09/2021   SHOULDER ARTHROSCOPY Left 03/14/2019   Procedure: LEFT SHOULDER ARTHROSCOPY, DEBRIDEMENT, AND DECOMPRESSION;  Surgeon: Newt Minion, MD;  Location: [place];  Service: Orthopedics;  Laterality: Left;   TOTAL KNEE ARTHROPLASTY Right 06/29/2017   Procedure: RIGHT TOTAL KNEE ARTHROPLASTY;  Surgeon: Newt Minion, MD;  Location: [place];  Service: Orthopedics;  Laterality: Right;   TOTAL KNEE ARTHROPLASTY Left 12/07/2017   TOTAL KNEE  ARTHROPLASTY Left 12/07/2017   Procedure: LEFT TOTAL KNEE ARTHROPLASTY;  Surgeon: Newt Minion, MD;  Location: [place];  Service: Orthopedics;  Laterality: Left;   TRACHEOSTOMY TUBE PLACEMENT N/A 09/20/2017   Procedure: AWAKE INTUBATION WITH ANESTHESIA WITH VIDEO ASSISTANCE;  Surgeon: Helayne Seminole, MD;  Location: [facility] [facility];  Service: ENT;  Laterality: N/A;   TRANSTHORACIC ECHOCARDIOGRAM  08/2014   Normal LV size and function.  Mild LVH.  EF 55-60%.  Normal regional wall motion.  GR 1 DD.  Normal RV size and function .   TRANSTHORACIC ECHOCARDIOGRAM  08/12/2020    [facility]): Normal LV size and function.  EF 55 to 60%.  No RWM A.  Normal filling pattern.  Normal RV.  No significant valvular disease-stenosis or regurgitation.  No vegetation.   TUBAL LIGATION     with reversal in [place]      Family History  Problem Relation Age of Onset   Diabetes Father    Heart attack Father 63  Coronary artery disease Father    Heart failure Father    Throat cancer Father    Hypertension Father    Heart disease Father    Sleep apnea Father    Obesity Father    COPD Mother    Emphysema Mother    Asthma Mother    Heart failure Mother    Breast cancer Mother    Diabetes Mother    Kidney disease Mother    Thyroid disease Mother    Heart attack Maternal Grandfather    Sarcoidosis Maternal Uncle    Lung cancer Brother    Diabetes Brother    Colon cancer Neg Hx    Colon polyps Neg Hx    Esophageal cancer Neg Hx    Rectal cancer Neg Hx    Stomach cancer Neg Hx     Social History:  reports that she has never smoked. She has never used smokeless tobacco. She reports that she does not drink alcohol and does not use drugs.  Review of Systems   Last diabetic eye exam date 03/29/2022  Last foot exam date: 10/22  Symptoms of neuropathy: She has had some burning and pain in her feet and legs, generally controlled with  gabapentin at bedtime   Hypertension:   Treatment includes Micardis 20 mg followed elsewhere  Potassium is high, she currently is on 3 potassium tablets with 40 mg Lasix  BP Readings from Last 3 Encounters:  10/14/22 (!) 156/72  09/13/22 (!) 152/75  09/07/22 130/76   For the last few weeks has been taking meloxicam for pain in the hand joints Also renal function appears to be relatively worse  Lab Results  Component Value Date   CREATININE 1.42 (H) 10/12/2022   CREATININE 1.11 06/15/2022   CREATININE 1.11 (H) 06/08/2022      Lipid management: She has had mixed hyperlipidemia This is followed by her PCP, treated with Lipitor and fenofibrate    Lab Results  Component Value Date   CHOL 137 10/12/2022   CHOL 164 07/21/2021   CHOL 162 03/31/2020   Lab Results  Component Value Date   HDL 50.60 10/12/2022   HDL 49.00 07/21/2021   HDL 40 03/31/2020   Lab Results  Component Value Date   LDLCALC 56 10/12/2022   LDLCALC 84 03/31/2020   LDLCALC 49 03/10/2017   Lab Results  Component Value Date   TRIG 154.0 (H) 10/12/2022   TRIG 260.0 (H) 07/21/2021   TRIG 227 (H) 03/31/2020   Lab Results  Component Value Date   CHOLHDL 3 10/12/2022   CHOLHDL 3 07/21/2021   CHOLHDL 5 01/22/2020   Lab Results  Component Value Date   LDLDIRECT 84.0 07/21/2021   LDLDIRECT 68.0 01/22/2020   LDLDIRECT 86.0 07/04/2019     Examination:   BP (!) 156/72   Pulse 80   Ht '5\' 6"'$  (1.676 m)   Wt 214 lb (97.1 kg)   SpO2 98%   BMI 34.54 kg/m   Body mass index is 34.54 kg/m.    ASSESSMENT/ PLAN:    Diabetes type 2 with obesity, hypertension and hyperlipidemia:   Current regimen: Once a day Lantus and NovoLog, Trulicity 4.5 mg and metformin  See history of present illness for detailed discussion of current diabetes management, blood sugar patterns and problems identified  A1c is 8.1  Blood glucose control has been consistently poor although A1c is slightly better than before and  however this slightly improved compared to July However she is on  inadequate and inappropriate insulin regimen Appears to be needing significant education regarding diabetes management, insulin and blood sugar targets  She has not been able to successfully use the freestyle libre although data is fairly complete from early December This showed only 35% readings within target  LIPIDS: Relatively well-controlled although triglycerides are borderline high  Recommendations:  Change LANTUS to suppertime instead of morning and start with 22 units Given her flowsheet to help her titrate this as discussed before She will go up every 3 days by 2 units until her morning sugars are below 130 Change NOVOLOG to suppertime and use average of 14 units She will also start NovoLog 18 units at breakfast and 10 at lunch Discussed needing to keep her postprandial readings under 180 and further adjustment may need to be made She will follow-up with the diabetes educator for review Try to use Dexcom G7 instead of freestyle libre for better reliability and may use abdomen if needed Patient information given on Dexcom and the clarity app Restart exercise regimen More regular follow-up  With her potassium and creatinine being higher she was encouraged to stop meloxicam if it could or reduce the dose at least She will also reduce her potassium supplements to 1 a day for now and follow-up with her PCP   Patient Instructions  Novolog 8 at breakfast, 10 at lunch and 14 at dinner  Lantus in am, start with 22 units  Metformin 3 daily  Stop Meloxicam if able  Exercise     Elayne Snare 10/14/2022, 9:00 PM

## 2022-10-18 ENCOUNTER — Ambulatory Visit: Payer: BC Managed Care – PPO | Admitting: Pulmonary Disease

## 2022-10-22 DIAGNOSIS — G4733 Obstructive sleep apnea (adult) (pediatric): Secondary | ICD-10-CM | POA: Diagnosis not present

## 2022-10-25 DIAGNOSIS — M1812 Unilateral primary osteoarthritis of first carpometacarpal joint, left hand: Secondary | ICD-10-CM | POA: Diagnosis not present

## 2022-10-25 DIAGNOSIS — N3000 Acute cystitis without hematuria: Secondary | ICD-10-CM | POA: Diagnosis not present

## 2022-10-25 DIAGNOSIS — N952 Postmenopausal atrophic vaginitis: Secondary | ICD-10-CM | POA: Diagnosis not present

## 2022-10-27 ENCOUNTER — Ambulatory Visit: Payer: BC Managed Care – PPO | Admitting: Registered"

## 2022-10-27 DIAGNOSIS — M79642 Pain in left hand: Secondary | ICD-10-CM | POA: Diagnosis not present

## 2022-10-28 DIAGNOSIS — M1812 Unilateral primary osteoarthritis of first carpometacarpal joint, left hand: Secondary | ICD-10-CM | POA: Diagnosis not present

## 2022-11-02 ENCOUNTER — Ambulatory Visit (INDEPENDENT_AMBULATORY_CARE_PROVIDER_SITE_OTHER): Payer: BC Managed Care – PPO | Admitting: Pulmonary Disease

## 2022-11-02 ENCOUNTER — Encounter: Payer: Self-pay | Admitting: Pulmonary Disease

## 2022-11-02 VITALS — BP 130/64 | HR 94 | Temp 98.1°F | Ht 66.0 in | Wt 217.6 lb

## 2022-11-02 DIAGNOSIS — D860 Sarcoidosis of lung: Secondary | ICD-10-CM | POA: Diagnosis not present

## 2022-11-02 DIAGNOSIS — J4540 Moderate persistent asthma, uncomplicated: Secondary | ICD-10-CM | POA: Diagnosis not present

## 2022-11-02 DIAGNOSIS — G4730 Sleep apnea, unspecified: Secondary | ICD-10-CM

## 2022-11-02 NOTE — Patient Instructions (Signed)
Use over-the-counter medications such as decongestants for sinus congestion Continues in the inhalers as prescribed Work on weight loss as you have sleep study shows mild sleep apnea Return to clinic in 6 months

## 2022-11-02 NOTE — Progress Notes (Addendum)
[name] BRUSTER    [id number]    10-08-1961  Primary Care Physician:Hunter, Brayton Mars, MD  Referring Physician: [name], MD [place],  [place] [id number]  Chief complaint: Follow up for sarcoidosis, asthma, pulmonary hypertension  HPI: 62 y.o. who has history of hypertension, asthma, chronic bronchitis, sarcoidosis, diastolic CHF, GERD, diabetes, vocal cord dysfunction.  She has history of sarcoidosis diagnosed with mediastinoscopy in 2008.  Used to follow with Dr. Casper Harrison in [facility] with regular biannual follow-ups. She had been on high doses of prednisone. This has been slowly tapered to 5 mg a day.  Prior CT scans did not show any obvious pulmonary parenchymal involvement with sarcoidosis.  Her sarcoidosis is believed to have an airway component as well since she has recurrent attacks of bronchitis. She is also diagnosed with vocal cord dysfunction. She has taken off prednisone 21 after she had a femur fracture with no flareup of symptoms.  Last seen in pulmonary clinic in 2018 and she has not kept follow-up at [facility] since 2022  Hospitalized in August 2023 with nonproductive cough, dyspnea.  Noted to have significant wheezing and given a diagnosis COPD with exacerbation.  Tested negative for COVID though her husband was positive prior to admission.  PCCM was consulted.  She also saw ENT for evaluation of vocal cord dysfunction with fiberoptic evaluation showing normal vocal cord movement.  There is also question of enlarged PA on CT imaging however echocardiogram did not show significant right-sided dysfunction or pulmonary hypertension.  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  No mold, hot tub, Jacuzzi.  No feather pillows or comforters Smoking history: Never smoker Travel history: No significant travel  history Relevant family history: Uncle had sarcoidosis.  Interim history: Here for review of sleep study. States that breathing is stable with no issues Has some sinus and upper chest congestion for the past 2 weeks.  Is not associated with dyspnea or wheezing  Outpatient Encounter Medications as of 11/02/2022  Medication Sig   albuterol (PROVENTIL HFA;VENTOLIN HFA) 108 (90 Base) MCG/ACT inhaler Inhale 1-2 puffs into the lungs every 6 (six) hours as needed for wheezing or shortness of breath.   albuterol (PROVENTIL) (2.5 MG/3ML) 0.083% nebulizer solution Take 2.5 mg by nebulization every 6 (six) hours as needed for wheezing or shortness of breath.   allopurinol (ZYLOPRIM) 300 MG tablet Take 1 tablet (300 mg total) by mouth daily.   aspirin EC 81 MG tablet Take 1 tablet (81 mg total) by mouth daily.   atorvastatin (LIPITOR) 40 MG tablet TAKE 1 TABLET BY MOUTH EVERY DAY   benzonatate (TESSALON PERLES) 100 MG capsule Take 2 capsules (200 mg total) by mouth 3 (three) times daily as needed for cough.   BREO ELLIPTA 200-25 MCG/INH AEPB TAKE 1 PUFF BY MOUTH EVERY DAY (Patient taking differently: Inhale 1 puff into the lungs daily.)   buPROPion (WELLBUTRIN XL) 300 MG 24 hr tablet TAKE 1 TABLET BY MOUTH EVERY DAY   carvedilol (COREG) 25 MG tablet Take 1 tablet by mouth daily.   chlorpheniramine-HYDROcodone (TUSSIONEX) 10-8 MG/5ML Take 5 mLs by mouth at bedtime as needed for cough (do not drive for 8 hours after taking).   Cholecalciferol 25 MCG (1000 UT) capsule Take 1,000 Units by mouth in the morning and at bedtime.   Continuous  Blood Gluc Sensor (DEXCOM G7 SENSOR) MISC 1 Device by Does not apply route as directed. Change sensor every 10 days   diclofenac Sodium (VOLTAREN) 1 % GEL Apply 1 application topically as needed (pain.).    diltiazem (CARDIZEM CD) 120 MG 24 hr capsule TAKE 1 CAPSULE BY MOUTH EVERY DAY   Dulaglutide (TRULICITY) 4.5 UU/7.2ZD SOPN Inject 4.5 mg as directed once a week.    famotidine (PEPCID) 20 MG tablet Take 1 tablet (20 mg total) by mouth at bedtime.   fenofibrate 160 MG tablet TAKE 1 TABLET BY MOUTH EVERY DAY   fluticasone (FLONASE) 50 MCG/ACT nasal spray Place 2 sprays into both nostrils daily as needed for allergies.   furosemide (LASIX) 40 MG tablet Take 1 tablet (40 mg total) by mouth daily.   gabapentin (NEURONTIN) 300 MG capsule Take at night in addition to the '100mg'$  gabapentin to get '400mg'$  at night and '100mg'$  in the morning and at mid day.   glucose blood (CONTOUR NEXT TEST) test strip 1 each by Other route 4 (four) times daily. And lancets 4/day   HYDROcodone-acetaminophen (NORCO/VICODIN) 5-325 MG tablet Take 1 tablet by mouth every 6 (six) hours as needed.   insulin aspart (NOVOLOG FLEXPEN) 100 UNIT/ML FlexPen Inject 15 Units into the skin daily with supper.   insulin glargine (LANTUS SOLOSTAR) 100 UNIT/ML Solostar Pen Inject 16 Units into the skin every morning. (Patient taking differently: Inject 18 Units into the skin every morning.)   Insulin Pen Needle (PEN NEEDLES) 32G X 4 MM MISC 1 each by Does not apply route 4 (four) times daily. E11.9   ipratropium (ATROVENT) 0.06 % nasal spray PLACE 2 SPRAYS INTO BOTH NOSTRILS 4 TIMES DAILY.   Lancet Devices (EASY MINI EJECT LANCING DEVICE) MISC Use as directed 4 times daily to test blood sugar   LINZESS 290 MCG CAPS capsule TAKE 1 CAPSULE BY MOUTH DAILY BEFORE BREAKFAST. (Patient taking differently: Take 290 mcg by mouth daily as needed (IBS symptoms).)   meloxicam (MOBIC) 15 MG tablet Take 1 tablet (15 mg total) by mouth daily.   metFORMIN (GLUCOPHAGE-XR) 500 MG 24 hr tablet Take 4 tablets (2,000 mg total) by mouth daily with breakfast.   Microlet Lancets MISC 1 each by Does not apply route 2 (two) times daily. E11.9   ondansetron (ZOFRAN-ODT) 4 MG disintegrating tablet Take 1 tablet (4 mg total) by mouth every 8 (eight) hours as needed for nausea or vomiting.   pantoprazole (PROTONIX) 40 MG tablet Take 1  tablet (40 mg total) by mouth 2 (two) times daily.   potassium chloride SA (KLOR-CON M20) 20 MEQ tablet Take 1.5 tablets (30 mEq total) by mouth daily.   telmisartan (MICARDIS) 20 MG tablet TAKE 1 TABLET BY MOUTH EVERY DAY   venlafaxine XR (EFFEXOR-XR) 75 MG 24 hr capsule TAKE 1 CAPSULE BY MOUTH TWICE A DAY   [DISCONTINUED] amoxicillin-clavulanate (AUGMENTIN) 250-125 MG tablet Take 1 tablet by mouth 3 (three) times daily. (Patient not taking: Reported on 09/07/2022)   [DISCONTINUED] dexlansoprazole (DEXILANT) 60 MG capsule Take 1 capsule (60 mg total) by mouth daily.   [DISCONTINUED] ipratropium (ATROVENT) 0.02 % nebulizer solution Take 2.5 mLs (0.5 mg total) by nebulization every 6 (six) hours as needed for wheezing or shortness of breath. (Patient not taking: Reported on 09/07/2022)   [DISCONTINUED] mupirocin nasal ointment (BACTROBAN) 2 % Place 1 application into the nose 2 (two) times daily. Use one-half of tube in each nostril twice daily for five (5) days. After application, press  sides of nose together and gently massage.   [DISCONTINUED] nitrofurantoin, macrocrystal-monohydrate, (MACROBID) 100 MG capsule Take 1 capsule (100 mg total) by mouth 2 (two) times daily.   [DISCONTINUED] predniSONE (DELTASONE) 20 MG tablet Take 2 pills in the morning with breakfast for 4 days, then 1 pill for 3 days   [DISCONTINUED] sulfamethoxazole-trimethoprim (BACTRIM) 200-40 MG/5ML suspension Take by mouth 2 (two) times daily.   Facility-Administered Encounter Medications as of 11/02/2022  Medication   0.9 %  sodium chloride infusion    Physical Exam: Blood pressure 130/64, pulse 94, temperature 98.1 F (36.7 C), temperature source Oral, height '5\' 6"'$  (1.676 m), weight 217 lb 9.6 oz (98.7 kg), SpO2 98 %. Gen:      No acute distress HEENT:  EOMI, sclera anicteric Neck:     No masses; no thyromegaly Lungs:    Clear to auscultation bilaterally; normal respiratory effort CV:         Regular rate and rhythm; no  murmurs Abd:      + bowel sounds; soft, non-tender; no palpable masses, no distension Ext:    No edema; adequate peripheral perfusion Skin:      Warm and dry; no rash Neuro: alert and oriented x 3 Psych: normal mood and affect   Data Reviewed: Imaging CT chest (07/03/13)- No CT findings to suggest sarcoidosis. A single tiny punctate indeterminate nodule is noted. The nodule's oval configuration makes an intrapulmonary lymph node or other benign entity more likely.   CTA 06/05/2022-no evidence of pulmonary embolism, cardiomegaly, enlarged pulmonary artery.  Lungs are clear. I have reviewed the images personally.  PFTs  02/12/16 FVC 2.12 (58%), FEV1 1.75 (1%), F/F 83, TLC 75%, DLCO 65% Moderate restriction and reduction in diffusion capacity. Improved compared to 2015  10/14/2022 FVC 2.61 [75%], FEV1 2.26 [84%], F/F86, TLC 5.03 [93%], DLCO 20.21 [94%] Air trapping, bronchodilator response   Labs: CBC 06/15/22 WBC 11.2, eos 4.1%, absolute eosinophil count 459  Surgical pathology, mediastinal lymph nodes 11/07/06 Nonnecrotizing granulomas.  Cardiac: Echocardiogram 06/08/2022-LVEF 16-60%, grade 1 diastolic dysfunction.  Normal RV size and function, small pericardial effusion.  No TR noted.  Sleep: PSG 10/14/2022 Mild OSA, AHI 8.5, desats to 80%.  Assessment:  Sarcoidosis Diagnosed with mediastinoscopy in 2008.  Had been on prednisone on past but off steroids since 2021. Recent CT reviewed with no significant pulmonary involvement of sarcoidosis.  It is previously felt that she had airway involvement of sarcoidosis leading to recurrent bronchitis  Chronic bronchitis Asthma/Airway component of sarcoidosis Continue Breo, albuterol as needed PFTs reviewed with bronchodilator response consistent with asthma  Enlarged PA Noted on recent CT chest however echocardiogram is reassuring with normal RV size and function  OSA Sleep study reviewed with mild sleep apnea.  We reviewed treatment  options including CPAP but she would like to try losing weight first Desats to 80 noted but it appears to be artifactual looking at the tracings  Plan/Recommendations: Continue Breo, albuterol as needed Weight loss  Marshell Garfinkel MD [facility] 11/02/2022, 1:50 PM  CC: [name], MD

## 2022-11-04 ENCOUNTER — Encounter: Payer: Self-pay | Admitting: Family

## 2022-11-04 ENCOUNTER — Other Ambulatory Visit (HOSPITAL_COMMUNITY): Payer: Self-pay

## 2022-11-04 ENCOUNTER — Ambulatory Visit (INDEPENDENT_AMBULATORY_CARE_PROVIDER_SITE_OTHER): Payer: BC Managed Care – PPO | Admitting: Family

## 2022-11-04 ENCOUNTER — Ambulatory Visit: Payer: BC Managed Care – PPO | Admitting: Physician Assistant

## 2022-11-04 ENCOUNTER — Encounter: Payer: Self-pay | Admitting: Endocrinology

## 2022-11-04 VITALS — BP 132/79 | HR 94 | Temp 98.0°F | Ht 66.0 in | Wt 211.5 lb

## 2022-11-04 DIAGNOSIS — G4483 Primary cough headache: Secondary | ICD-10-CM

## 2022-11-04 DIAGNOSIS — J441 Chronic obstructive pulmonary disease with (acute) exacerbation: Secondary | ICD-10-CM

## 2022-11-04 DIAGNOSIS — R059 Cough, unspecified: Secondary | ICD-10-CM

## 2022-11-04 LAB — POCT INFLUENZA A/B
Influenza A, POC: NEGATIVE
Influenza B, POC: NEGATIVE

## 2022-11-04 MED ORDER — BENZONATATE 100 MG PO CAPS: 100.0000 mg | ORAL_CAPSULE | Freq: Three times a day (TID) | ORAL | 0 refills | Status: DC | PRN

## 2022-11-04 MED ORDER — HYDROCOD POLI-CHLORPHE POLI ER 10-8 MG/5ML PO SUER: 5.0000 mL | Freq: Every evening | ORAL | 0 refills | Status: DC | PRN

## 2022-11-04 MED ORDER — CYCLOBENZAPRINE HCL 5 MG PO TABS: 5.0000 mg | ORAL_TABLET | Freq: Three times a day (TID) | ORAL | 0 refills | Status: DC | PRN

## 2022-11-04 NOTE — Progress Notes (Signed)
Patient ID: [name], female    DOB: 31-Mar-1961, 62 y.o.   MRN: [id number]  Chief Complaint  Patient presents with   Cough    Pt c/o cough, chills,body aches,headaches and Nasal congestion, Present since Tuesday. Covid negative last night. Has tried tussionex cough syrup, which did not help. Flu shot this season.    HPI:     URI sx:   Pt c/o cough, chills,body aches,headaches and Nasal congestion, Present since Tuesday. Covid negative last night. Has tried tussionex cough syrup, which did not help. Flu shot this season. Seen by pulmonology and had been coughing slightly, but got worse later.  Reports dry cough, ran out of tessalon pearles, running out. Has used her Atrovent nasal spray just a few times.  Assessment & Plan:  1. COPD with acute exacerbation ([place]) - rapid flu neg, lungs clear, reminded pt again on proper inhaler use, use nebulizer at least bid until cough better. Increase fluid intake to 2L/d.  - POCT Influenza A/B  2. Cough, unspecified type - refilling tessalon pearles and cough syrup. Reminded to use only prn, focus on proper inhaler use and increasing water intake to 2L/ day. Advised to cut Atrovent nasal spray to bid and Flonase qd, mouth very dry.  - benzonatate (TESSALON PERLES) 100 MG capsule; Take 1-2 capsules (100-200 mg total) by mouth 3 (three) times daily as needed for cough.  Dispense: 30 capsule; Refill: 0 - chlorpheniramine-HYDROcodone (TUSSIONEX) 10-8 MG/5ML; Take 5 mLs by mouth at bedtime as needed for cough (do not drive for 8 hours after taking).  Dispense: 115 mL; Refill: 0  3. Primary cough headache - sending Flexeril, advised on use & SE, ok to take with Tylenol or spaced out.  - cyclobenzaprine (FLEXERIL) 5 MG tablet; Take 1 tablet (5 mg total) by mouth 3 (three) times daily as needed for muscle spasms (or Headache).  Dispense: 30 tablet; Refill: 0   Subjective:    Outpatient Medications Prior to Visit  Medication Sig Dispense Refill    albuterol (PROVENTIL HFA;VENTOLIN HFA) 108 (90 Base) MCG/ACT inhaler Inhale 1-2 puffs into the lungs every 6 (six) hours as needed for wheezing or shortness of breath. 8 g 5   albuterol (PROVENTIL) (2.5 MG/3ML) 0.083% nebulizer solution Take 2.5 mg by nebulization every 6 (six) hours as needed for wheezing or shortness of breath.     allopurinol (ZYLOPRIM) 300 MG tablet Take 1 tablet (300 mg total) by mouth daily. 90 tablet 0   aspirin EC 81 MG tablet Take 1 tablet (81 mg total) by mouth daily. 30 tablet 11   atorvastatin (LIPITOR) 40 MG tablet TAKE 1 TABLET BY MOUTH EVERY DAY 90 tablet 3   BREO ELLIPTA 200-25 MCG/INH AEPB TAKE 1 PUFF BY MOUTH EVERY DAY (Patient taking differently: Inhale 1 puff into the lungs daily.) 60 each 5   buPROPion (WELLBUTRIN XL) 300 MG 24 hr tablet TAKE 1 TABLET BY MOUTH EVERY DAY 90 tablet 3   carvedilol (COREG) 25 MG tablet Take 1 tablet by mouth daily.     Cholecalciferol 25 MCG (1000 UT) capsule Take 1,000 Units by mouth in the morning and at bedtime.     Continuous Blood Gluc Sensor (DEXCOM G7 SENSOR) MISC 1 Device by Does not apply route as directed. Change sensor every 10 days 3 each 3   diclofenac Sodium (VOLTAREN) 1 % GEL Apply 1 application topically as needed (pain.).      diltiazem (CARDIZEM CD) 120 MG 24 hr capsule  TAKE 1 CAPSULE BY MOUTH EVERY DAY 90 capsule 3   Dulaglutide (TRULICITY) 4.5 EP/3.2RJ SOPN Inject 4.5 mg as directed once a week. 6 mL 3   famotidine (PEPCID) 20 MG tablet Take 1 tablet (20 mg total) by mouth at bedtime. 30 tablet 3   fenofibrate 160 MG tablet TAKE 1 TABLET BY MOUTH EVERY DAY 90 tablet 3   fluticasone (FLONASE) 50 MCG/ACT nasal spray Place 2 sprays into both nostrils daily as needed for allergies. 48 mL 3   furosemide (LASIX) 40 MG tablet Take 1 tablet (40 mg total) by mouth daily.     gabapentin (NEURONTIN) 300 MG capsule Take at night in addition to the '100mg'$  gabapentin to get '400mg'$  at night and '100mg'$  in the morning and at mid  day. 90 capsule 3   glucose blood (CONTOUR NEXT TEST) test strip 1 each by Other route 4 (four) times daily. And lancets 4/day 400 each 3   HYDROcodone-acetaminophen (NORCO/VICODIN) 5-325 MG tablet Take 1 tablet by mouth every 6 (six) hours as needed. 15 tablet 0   insulin aspart (NOVOLOG FLEXPEN) 100 UNIT/ML FlexPen Inject 15 Units into the skin daily with supper. 15 mL 11   insulin glargine (LANTUS SOLOSTAR) 100 UNIT/ML Solostar Pen Inject 16 Units into the skin every morning. (Patient taking differently: Inject 18 Units into the skin every morning.) 30 mL 2   Insulin Pen Needle (PEN NEEDLES) 32G X 4 MM MISC 1 each by Does not apply route 4 (four) times daily. E11.9 400 each 0   ipratropium (ATROVENT) 0.06 % nasal spray PLACE 2 SPRAYS INTO BOTH NOSTRILS 4 TIMES DAILY. 15 mL 3   Lancet Devices (EASY MINI EJECT LANCING DEVICE) MISC Use as directed 4 times daily to test blood sugar 1 each 3   LINZESS 290 MCG CAPS capsule TAKE 1 CAPSULE BY MOUTH DAILY BEFORE BREAKFAST. (Patient taking differently: Take 290 mcg by mouth daily as needed (IBS symptoms).) 30 capsule 3   meloxicam (MOBIC) 15 MG tablet Take 1 tablet (15 mg total) by mouth daily. 30 tablet 1   metFORMIN (GLUCOPHAGE-XR) 500 MG 24 hr tablet Take 4 tablets (2,000 mg total) by mouth daily with breakfast. 360 tablet 3   Microlet Lancets MISC 1 each by Does not apply route 2 (two) times daily. E11.9 100 each 2   ondansetron (ZOFRAN-ODT) 4 MG disintegrating tablet Take 1 tablet (4 mg total) by mouth every 8 (eight) hours as needed for nausea or vomiting. 20 tablet 0   pantoprazole (PROTONIX) 40 MG tablet Take 1 tablet (40 mg total) by mouth 2 (two) times daily. 180 tablet 3   potassium chloride SA (KLOR-CON M20) 20 MEQ tablet Take 1.5 tablets (30 mEq total) by mouth daily.     telmisartan (MICARDIS) 20 MG tablet TAKE 1 TABLET BY MOUTH EVERY DAY 90 tablet 1   venlafaxine XR (EFFEXOR-XR) 75 MG 24 hr capsule TAKE 1 CAPSULE BY MOUTH TWICE A DAY 180  capsule 3   benzonatate (TESSALON PERLES) 100 MG capsule Take 2 capsules (200 mg total) by mouth 3 (three) times daily as needed for cough. (Patient not taking: Reported on 11/04/2022) 20 capsule 0   chlorpheniramine-HYDROcodone (TUSSIONEX) 10-8 MG/5ML Take 5 mLs by mouth at bedtime as needed for cough (do not drive for 8 hours after taking). (Patient not taking: Reported on 11/04/2022) 70 mL 0   Facility-Administered Medications Prior to Visit  Medication Dose Route Frequency Provider Last Rate Last Admin   0.9 %  sodium  chloride infusion  500 mL Intravenous Continuous Irene Shipper, MD       Past Medical History:  Diagnosis Date   Abnormal SPEP 04/17/2014   Acute left ankle pain 01/26/2017   ANEMIA-UNSPECIFIED 09/18/2009   Anxiety    Arthritis    Blood transfusion without reported diagnosis    Cataract    CHF (congestive heart failure) (HCC)    Chronic diastolic heart failure, NYHA class 2 (HCC)    Normal LVEDP by May 2018   COPD (chronic obstructive pulmonary disease) ([place])    Depression    DIABETES MELLITUS, TYPE II 08/21/2006   Diabetic osteomyelitis ([place]) 05/29/2015   Fatty liver    Fracture of 5th metatarsal    non union   GERD 32/44/0102   Had fundoplication   GOUT 72/53/6644   Hammer toe of right foot    2-5th toes   Hx of umbilical hernia repair    HYPERLIPIDEMIA 08/21/2006   HYPERTENSION 08/21/2006   Infection of wound due to methicillin resistant Staphylococcus aureus (MRSA)    Internal hemorrhoids    Kidney problem    Multiple allergies 10/14/2016   OBESITY 06/04/2009   Onychomycosis 10/27/2015   Osteomyelitis of left foot ([place]) 05/29/2015   Osteoporosis    Pulmonary sarcoidosis ([place])    Followed locally by pulmonology, but also by Dr. Casper Harrison at [facility]   Right knee pain 01/26/2017   Vocal cord dysfunction    Wears partial dentures    Past Surgical History:  Procedure Laterality Date   ABDOMINAL HYSTERECTOMY     APPENDECTOMY     BLADDER SUSPENSION   11/11/2011   Procedure: TRANSVAGINAL TAPE (TVT) PROCEDURE;  Surgeon: Olga Millers, MD;  Location: [facility];  Service: Gynecology;  Laterality: N/A;   CAROTIDS  02/18/2011   CAROTID DUPLEX; VERTEBRALS ARE PATENT WITH ANTEGRADE FLOW. ICA/CCA RATIO 1.61 ON RIGHT AND 0.75 ON LEFT   CATARACT EXTRACTION, BILATERAL     CHOLECYSTECTOMY  1984   COLONOSCOPY  04/29/2010   [name]   CYSTOSCOPY  11/11/2011   Procedure: CYSTOSCOPY;  Surgeon: Olga Millers, MD;  Location: [facility];  Service: Gynecology;  Laterality: N/A;   EXTUBATION (ENDOTRACHEAL) IN OR N/A 09/23/2017   Procedure: EXTUBATION (ENDOTRACHEAL) IN OR;  Surgeon: Helayne Seminole, MD;  Location: [place];  Service: ENT;  Laterality: N/A;   FIBEROPTIC LARYNGOSCOPY AND TRACHEOSCOPY N/A 09/23/2017   Procedure: FLEXIBLE FIBEROPTIC LARYNGOSCOPY;  Surgeon: Helayne Seminole, MD;  Location: [facility];  Service: ENT;  Laterality: N/A;   FRACTURE SURGERY     foot   [place] Left 10/02/2018   Procedure: HALLUX ARTHRODESIS;  Surgeon: Edrick Kins, DPM;  Location: [facility];  Service: Podiatry;  Laterality: Left;   HAMMER TOE SURGERY Left 10/02/2018   Procedure: HAMMER TOE CORRECTION 2ND 3RD 4RD FIFTH TOE;  Surgeon: Edrick Kins, DPM;  Location: [facility];  Service: Podiatry;  Laterality: Left;   HAMMER TOE SURGERY Right 04/12/2019   Procedure: HAMMER TOE CORRECTION, 2ND, 3RD, 4TH AND 5TH TOES OF RIGHT FOOT;  Surgeon: Edrick Kins, DPM;  Location: [facility];  Service: Podiatry;  Laterality: Right;   HAMMER TOE SURGERY Left 10/25/2019   Procedure: HAMMER TOE REPAIR SECOND, THIRD, FOUTH;  Surgeon: Edrick Kins, DPM;  Location: [facility];  Service: Podiatry;  Laterality: Left;   HERNIA REPAIR     I & D EXTREMITY Left 06/27/2015   Procedure: Partial Excision Left Calcaneus, Place Antibiotic Beads, and Wound  VAC;  Surgeon: Newt Minion, MD;  Location: [place];  Service: Orthopedics;  Laterality: Left;   JOINT REPLACEMENT     KNEE ARTHROSCOPY     right   LEFT  AND RIGHT HEART CATHETERIZATION WITH CORONARY ANGIOGRAM N/A 04/23/2013   Procedure: LEFT AND RIGHT HEART CATHETERIZATION WITH CORONARY ANGIOGRAM;  Surgeon: Leonie Man, MD;  Location: [facility] C[facility];  Service: Cardiovascular;  Laterality: N/A;   LEFT HEART CATH AND CORONARY ANGIOGRAPHY N/A 03/11/2017   Procedure: Left Heart Cath and Coronary Angiography;  Surgeon: Sherren Mocha, MD;  Location: [facility] CV LAB; angiographically minimal CAD in the LAD otherwise normal.;  Normal LVEDP.  FALSE POSITIVE MYOVIEW   LEFT HEART CATH AND CORONARY ANGIOGRAPHY  07/20/2010   LVEF 50-55% WITH VERY MILD GLOBAL HYPOKINESIA; ESSENTIALLY NORMAL CORONARY ARTERIES; NORMAL LV FUNCTION   METATARSAL OSTEOTOMY WITH OPEN REDUCTION INTERNAL FIXATION (ORIF) METATARSAL WITH FUSION Left 04/09/2014   Procedure: LEFT FOOT FRACTURE OPEN TREATMENT METATARSAL INCLUDES INTERNAL FIXATION EACH;  Surgeon: Lorn Junes, MD;  Location: [place];  Service: Orthopedics;  Laterality: Left;   NISSEN FUNDOPLICATION  1601   NM MYOVIEW LTD --> FALSE POSITIVE  03/10/2017   : Moderate size "stress-induced "perfusion defect at the apex as well as "ill-defined stress-induced perfusion defect in the lateral wall.  EF 55%.  INTERMEDIATE risk. -->  FALSE POSITIVE   ORIF DISTAL FEMUR FRACTURE  08/08/2020   [facility]: ORIF of left extra-articular periprosthetic distal femur fracture with synthesis of the femur plate with cortical nonlocking screws.  (Thought to be pathologic fracture due to osteoporosis) -> after a fall   ORIF FEMUR W/ PERI-IMPLANT  08/29/2020   [facility]) revision of left femur fracture ORIF plate fixation screws; culture positive for MSSA   Right and left CARDIAC CATHETERIZATION  04/23/2013   Angiographic normal coronaries; LVEDP 20 mmHg, PCWP 12-14 mmHg, RAP 12 mmHg.; Fick CO/CI 4.9/2.2   ROTATOR CUFF REPAIR Right 12/09/2021   SHOULDER ARTHROSCOPY Left 03/14/2019   Procedure: LEFT  SHOULDER ARTHROSCOPY, DEBRIDEMENT, AND DECOMPRESSION;  Surgeon: Newt Minion, MD;  Location: [place];  Service: Orthopedics;  Laterality: Left;   TOTAL KNEE ARTHROPLASTY Right 06/29/2017   Procedure: RIGHT TOTAL KNEE ARTHROPLASTY;  Surgeon: Newt Minion, MD;  Location: [place];  Service: Orthopedics;  Laterality: Right;   TOTAL KNEE ARTHROPLASTY Left 12/07/2017   TOTAL KNEE ARTHROPLASTY Left 12/07/2017   Procedure: LEFT TOTAL KNEE ARTHROPLASTY;  Surgeon: Newt Minion, MD;  Location: [place];  Service: Orthopedics;  Laterality: Left;   TRACHEOSTOMY TUBE PLACEMENT N/A 09/20/2017   Procedure: AWAKE INTUBATION WITH ANESTHESIA WITH VIDEO ASSISTANCE;  Surgeon: Helayne Seminole, MD;  Location: [facility];  Service: ENT;  Laterality: N/A;   TRANSTHORACIC ECHOCARDIOGRAM  08/2014   Normal LV size and function.  Mild LVH.  EF 55-60%.  Normal regional wall motion.  GR 1 DD.  Normal RV size and function .   TRANSTHORACIC ECHOCARDIOGRAM  08/12/2020    [facility]): Normal LV size and function.  EF 55 to 60%.  No RWM A.  Normal filling pattern.  Normal RV.  No significant valvular disease-stenosis or regurgitation.  No vegetation.   TUBAL LIGATION     with reversal in 1994   UPPER GASTROINTESTINAL ENDOSCOPY     VENTRAL HERNIA REPAIR     Allergies  Allergen Reactions   Methotrexate Other (See Comments)    Peri-oral and buccal lesions  Vancomycin Other (See Comments)    DOSE RELATED NEPHROTOXICITY Other reaction(s): other   Lisinopril Cough    Other reaction(s): other   Chlorhexidine Itching   Clindamycin/Lincomycin Nausea And Vomiting and Rash   Lincomycin Nausea Only and Rash    Other reaction(s): vomiting   Teflaro [Ceftaroline] Rash and Other (See Comments)    Tolerates ceftriaxone       Objective:    Physical Exam Vitals and nursing note reviewed.  Constitutional:      Appearance: Normal appearance.  HENT:     Right Ear: Ear canal and external ear normal.     Left Ear: Ear  canal and external ear normal.     Mouth/Throat:     Mouth: Mucous membranes are dry.     Pharynx: No pharyngeal swelling, oropharyngeal exudate, posterior oropharyngeal erythema or uvula swelling.     Tonsils: No tonsillar exudate or tonsillar abscesses.  Cardiovascular:     Rate and Rhythm: Normal rate and regular rhythm.  Pulmonary:     Effort: Pulmonary effort is normal.     Breath sounds: Normal breath sounds.  Musculoskeletal:        General: Normal range of motion.  Skin:    General: Skin is warm and dry.  Neurological:     Mental Status: She is alert.  Psychiatric:        Mood and Affect: Mood normal.        Behavior: Behavior normal.    BP 132/79 (BP Location: Right Arm, Patient Position: Sitting, Cuff Size: Large)   Pulse 94   Temp 98 F (36.7 C) (Temporal)   Ht '5\' 6"'$  (1.676 m)   Wt 211 lb 8 oz (95.9 kg)   SpO2 99%   BMI 34.14 kg/m  Wt Readings from Last 3 Encounters:  11/04/22 211 lb 8 oz (95.9 kg)  11/02/22 217 lb 9.6 oz (98.7 kg)  10/14/22 214 lb (97.1 kg)      Jeanie Sewer, NP

## 2022-11-04 NOTE — Patient Instructions (Signed)
It was very nice to see you today!    I have sent refills to your pharmacy. I sent in Flexeril to take for your headache. This may cause drowsiness. You can take with Tylenol together or alternate.   Remember to hydrate! At least 2 liters = 8 cups of water daily!     PLEASE NOTE:  If you had any lab tests please let us know if you have not heard back within a few days. You may see your results on MyChart before we have a chance to review them but we will give you a call once they are reviewed by [place]. If we ordered any referrals today, please let us know if you have not heard from their office within the next week.

## 2022-11-05 ENCOUNTER — Encounter: Payer: Self-pay | Admitting: Family

## 2022-11-09 ENCOUNTER — Emergency Department (HOSPITAL_BASED_OUTPATIENT_CLINIC_OR_DEPARTMENT_OTHER): Payer: Medicare Other | Admitting: Radiology

## 2022-11-09 ENCOUNTER — Other Ambulatory Visit: Payer: Self-pay

## 2022-11-09 ENCOUNTER — Inpatient Hospital Stay (HOSPITAL_BASED_OUTPATIENT_CLINIC_OR_DEPARTMENT_OTHER)
Admission: EM | Admit: 2022-11-09 | Discharge: 2022-11-12 | DRG: 193 | Disposition: A | Discharge status: Home / Self Care | Payer: Medicare Other | Attending: Family Medicine | Admitting: Family Medicine

## 2022-11-09 ENCOUNTER — Encounter (HOSPITAL_BASED_OUTPATIENT_CLINIC_OR_DEPARTMENT_OTHER): Payer: Self-pay

## 2022-11-09 DIAGNOSIS — Z1152 Encounter for screening for COVID-19: Secondary | ICD-10-CM

## 2022-11-09 DIAGNOSIS — D631 Anemia in chronic kidney disease: Secondary | ICD-10-CM | POA: Diagnosis not present

## 2022-11-09 DIAGNOSIS — E785 Hyperlipidemia, unspecified: Secondary | ICD-10-CM | POA: Diagnosis present

## 2022-11-09 DIAGNOSIS — I130 Hypertensive heart and chronic kidney disease with heart failure and stage 1 through stage 4 chronic kidney disease, or unspecified chronic kidney disease: Secondary | ICD-10-CM | POA: Diagnosis present

## 2022-11-09 DIAGNOSIS — Z825 Family history of asthma and other chronic lower respiratory diseases: Secondary | ICD-10-CM

## 2022-11-09 DIAGNOSIS — E1122 Type 2 diabetes mellitus with diabetic chronic kidney disease: Secondary | ICD-10-CM | POA: Diagnosis present

## 2022-11-09 DIAGNOSIS — F32A Depression, unspecified: Secondary | ICD-10-CM | POA: Diagnosis not present

## 2022-11-09 DIAGNOSIS — Z7982 Long term (current) use of aspirin: Secondary | ICD-10-CM

## 2022-11-09 DIAGNOSIS — Z6834 Body mass index (BMI) 34.0-34.9, adult: Secondary | ICD-10-CM

## 2022-11-09 DIAGNOSIS — J449 Chronic obstructive pulmonary disease, unspecified: Secondary | ICD-10-CM

## 2022-11-09 DIAGNOSIS — E669 Obesity, unspecified: Secondary | ICD-10-CM | POA: Diagnosis present

## 2022-11-09 DIAGNOSIS — Z888 Allergy status to other drugs, medicaments and biological substances status: Secondary | ICD-10-CM

## 2022-11-09 DIAGNOSIS — J9601 Acute respiratory failure with hypoxia: Secondary | ICD-10-CM | POA: Diagnosis not present

## 2022-11-09 DIAGNOSIS — N1832 Chronic kidney disease, stage 3b: Secondary | ICD-10-CM | POA: Diagnosis not present

## 2022-11-09 DIAGNOSIS — Z96653 Presence of artificial knee joint, bilateral: Secondary | ICD-10-CM | POA: Diagnosis present

## 2022-11-09 DIAGNOSIS — D860 Sarcoidosis of lung: Secondary | ICD-10-CM | POA: Diagnosis not present

## 2022-11-09 DIAGNOSIS — Z794 Long term (current) use of insulin: Secondary | ICD-10-CM

## 2022-11-09 DIAGNOSIS — K589 Irritable bowel syndrome without diarrhea: Secondary | ICD-10-CM | POA: Diagnosis not present

## 2022-11-09 DIAGNOSIS — Z90710 Acquired absence of both cervix and uterus: Secondary | ICD-10-CM

## 2022-11-09 DIAGNOSIS — R059 Cough, unspecified: Secondary | ICD-10-CM | POA: Diagnosis not present

## 2022-11-09 DIAGNOSIS — E1169 Type 2 diabetes mellitus with other specified complication: Secondary | ICD-10-CM | POA: Diagnosis present

## 2022-11-09 DIAGNOSIS — I5032 Chronic diastolic (congestive) heart failure: Secondary | ICD-10-CM | POA: Diagnosis not present

## 2022-11-09 DIAGNOSIS — Z8614 Personal history of Methicillin resistant Staphylococcus aureus infection: Secondary | ICD-10-CM

## 2022-11-09 DIAGNOSIS — J441 Chronic obstructive pulmonary disease with (acute) exacerbation: Secondary | ICD-10-CM | POA: Diagnosis present

## 2022-11-09 DIAGNOSIS — J1100 Influenza due to unidentified influenza virus with unspecified type of pneumonia: Secondary | ICD-10-CM | POA: Diagnosis not present

## 2022-11-09 DIAGNOSIS — K219 Gastro-esophageal reflux disease without esophagitis: Secondary | ICD-10-CM | POA: Diagnosis present

## 2022-11-09 DIAGNOSIS — M109 Gout, unspecified: Secondary | ICD-10-CM | POA: Diagnosis present

## 2022-11-09 DIAGNOSIS — Z9049 Acquired absence of other specified parts of digestive tract: Secondary | ICD-10-CM

## 2022-11-09 DIAGNOSIS — Z833 Family history of diabetes mellitus: Secondary | ICD-10-CM

## 2022-11-09 DIAGNOSIS — T380X5A Adverse effect of glucocorticoids and synthetic analogues, initial encounter: Secondary | ICD-10-CM | POA: Diagnosis present

## 2022-11-09 DIAGNOSIS — Z801 Family history of malignant neoplasm of trachea, bronchus and lung: Secondary | ICD-10-CM

## 2022-11-09 DIAGNOSIS — Z8249 Family history of ischemic heart disease and other diseases of the circulatory system: Secondary | ICD-10-CM

## 2022-11-09 DIAGNOSIS — F419 Anxiety disorder, unspecified: Secondary | ICD-10-CM | POA: Diagnosis not present

## 2022-11-09 DIAGNOSIS — N1831 Chronic kidney disease, stage 3a: Secondary | ICD-10-CM

## 2022-11-09 DIAGNOSIS — Z79899 Other long term (current) drug therapy: Secondary | ICD-10-CM

## 2022-11-09 DIAGNOSIS — M810 Age-related osteoporosis without current pathological fracture: Secondary | ICD-10-CM | POA: Diagnosis present

## 2022-11-09 DIAGNOSIS — K760 Fatty (change of) liver, not elsewhere classified: Secondary | ICD-10-CM | POA: Diagnosis not present

## 2022-11-09 DIAGNOSIS — E1165 Type 2 diabetes mellitus with hyperglycemia: Secondary | ICD-10-CM | POA: Diagnosis present

## 2022-11-09 DIAGNOSIS — Z881 Allergy status to other antibiotic agents status: Secondary | ICD-10-CM

## 2022-11-09 DIAGNOSIS — Z841 Family history of disorders of kidney and ureter: Secondary | ICD-10-CM

## 2022-11-09 LAB — TROPONIN I (HIGH SENSITIVITY)
Troponin I (High Sensitivity): 5 ng/L (ref <18)
Troponin I (High Sensitivity): 4 ng/L (ref <18)

## 2022-11-09 LAB — CBC WITH DIFFERENTIAL/PLATELET
Abs Immature Granulocytes: 0.31 10*3/uL — ABNORMAL HIGH (ref 0.00–0.07)
Basophils Absolute: 0.1 10*3/uL (ref 0.0–0.1)
Basophils Relative: 1 %
Eosinophils Absolute: 0.5 10*3/uL (ref 0.0–0.5)
Eosinophils Relative: 4 %
HCT: 30.9 % — ABNORMAL LOW (ref 36.0–46.0)
Hemoglobin: 10.4 g/dL — ABNORMAL LOW (ref 12.0–15.0)
Immature Granulocytes: 2 %
Lymphocytes Relative: 24 %
Lymphs Abs: 3.2 10*3/uL (ref 0.7–4.0)
MCH: 30.5 pg (ref 26.0–34.0)
MCHC: 33.7 g/dL (ref 30.0–36.0)
MCV: 90.6 fL (ref 80.0–100.0)
Monocytes Absolute: 0.7 10*3/uL (ref 0.1–1.0)
Monocytes Relative: 5 %
Neutro Abs: 8.9 10*3/uL — ABNORMAL HIGH (ref 1.7–7.7)
Neutrophils Relative %: 64 %
Platelets: 318 10*3/uL (ref 150–400)
RBC: 3.41 MIL/uL — ABNORMAL LOW (ref 3.87–5.11)
RDW: 13.9 % (ref 11.5–15.5)
WBC: 13.7 10*3/uL — ABNORMAL HIGH (ref 4.0–10.5)
nRBC: 0 % (ref 0.0–0.2)

## 2022-11-09 LAB — COMPREHENSIVE METABOLIC PANEL
ALT: 36 U/L (ref 0–44)
AST: 32 U/L (ref 15–41)
Albumin: 4.3 g/dL (ref 3.5–5.0)
Alkaline Phosphatase: 99 U/L (ref 38–126)
Anion gap: 11 (ref 5–15)
BUN: 18 mg/dL (ref 8–23)
CO2: 29 mmol/L (ref 22–32)
Calcium: 9.6 mg/dL (ref 8.9–10.3)
Chloride: 96 mmol/L — ABNORMAL LOW (ref 98–111)
Creatinine, Ser: 1.19 mg/dL — ABNORMAL HIGH (ref 0.44–1.00)
GFR, Estimated: 51 mL/min — ABNORMAL LOW (ref >60)
Glucose, Bld: 220 mg/dL — ABNORMAL HIGH (ref 70–99)
Potassium: 4.1 mmol/L (ref 3.5–5.1)
Sodium: 136 mmol/L (ref 135–145)
Total Bilirubin: 0.5 mg/dL (ref 0.3–1.2)
Total Protein: 7.1 g/dL (ref 6.5–8.1)

## 2022-11-09 LAB — RESP PANEL BY RT-PCR (RSV, FLU A&B, COVID)  RVPGX2
Influenza A by PCR: NEGATIVE
Influenza B by PCR: NEGATIVE
Resp Syncytial Virus by PCR: NEGATIVE
SARS Coronavirus 2 by RT PCR: NEGATIVE

## 2022-11-09 LAB — BRAIN NATRIURETIC PEPTIDE: B Natriuretic Peptide: 21 pg/mL (ref 0.0–100.0)

## 2022-11-09 MED ORDER — IPRATROPIUM-ALBUTEROL 0.5-2.5 (3) MG/3ML IN SOLN: 3.0000 mL | Freq: Once | RESPIRATORY_TRACT | Status: AC

## 2022-11-09 MED ORDER — ALBUTEROL SULFATE (2.5 MG/3ML) 0.083% IN NEBU: 2.5000 mg | INHALATION_SOLUTION | Freq: Once | RESPIRATORY_TRACT | Status: AC

## 2022-11-09 MED ORDER — MAGNESIUM SULFATE 2 GM/50ML IV SOLN
2.0000 g | Freq: Once | INTRAVENOUS | Status: AC
  Administered 2022-11-09: 2 g via INTRAVENOUS
  Filled 2022-11-09: qty 50

## 2022-11-09 MED ORDER — SODIUM CHLORIDE 0.9 % IV SOLN
1.0000 g | Freq: Once | INTRAVENOUS | Status: AC
  Administered 2022-11-09: 1 g via INTRAVENOUS
  Filled 2022-11-09: qty 10

## 2022-11-09 MED ORDER — IPRATROPIUM-ALBUTEROL 0.5-2.5 (3) MG/3ML IN SOLN
3.0000 mL | RESPIRATORY_TRACT | Status: AC | PRN
  Administered 2022-11-09 – 2022-11-10 (×3): 3 mL via RESPIRATORY_TRACT
  Filled 2022-11-09 (×2): qty 3

## 2022-11-09 MED ORDER — ALBUTEROL SULFATE (2.5 MG/3ML) 0.083% IN NEBU
INHALATION_SOLUTION | RESPIRATORY_TRACT | Status: AC
  Administered 2022-11-09: 2.5 mg
  Filled 2022-11-09: qty 3

## 2022-11-09 MED ORDER — METHYLPREDNISOLONE SODIUM SUCC 125 MG IJ SOLR
125.0000 mg | Freq: Once | INTRAMUSCULAR | Status: AC
  Administered 2022-11-09: 125 mg via INTRAVENOUS
  Filled 2022-11-09: qty 2

## 2022-11-09 MED ORDER — SODIUM CHLORIDE 0.9 % IV SOLN
500.0000 mg | Freq: Once | INTRAVENOUS | Status: AC
  Administered 2022-11-09: 500 mg via INTRAVENOUS
  Filled 2022-11-09: qty 5

## 2022-11-09 MED ORDER — IPRATROPIUM-ALBUTEROL 0.5-2.5 (3) MG/3ML IN SOLN
RESPIRATORY_TRACT | Status: AC
  Administered 2022-11-09: 3 mL
  Filled 2022-11-09: qty 3

## 2022-11-09 NOTE — ED Triage Notes (Signed)
Patient here POV from Home.  Endorses Coughing that began 1 week ago. Mostly Dry.  No Fevers. Seen by PCP and recommended Prescribed Medications and Supportive Therapy.  NAD Noted during Triage. A&Ox4. GCS 15. Ambulatory.

## 2022-11-09 NOTE — ED Notes (Signed)
RT in to assess patient. BBS diminished. Upper airway wheezing noted. Patient stated breathing treatment did not provide much improvement.

## 2022-11-09 NOTE — Progress Notes (Signed)
Hospitalist Transfer Note:  Transferring facility: [facility] Requesting provider: Dr. Oswald Hillock (EDP at [facility]) Reason for transfer: admission for further evaluation and management of acute COPD exacerbation.    62 year old female with history of COPD, sarcoidosis, who presented to [facility] ED complaining of progressive shortness of breath over the course the last week.  This been associated with worsening productive cough.  she notes a history of COPD complicated by prior hospitalizations for associated exacerbation.  She reports that the above symptoms are very similar to that which she has experienced at times of prior acute COPD exacerbations.  In this context, she contacted her outpatient pulmonologist, Dr. Vaughan Browner, who recommended that she present to the emergency department for further evaluation and management thereof.  She was noted to have very poor air movement initially along with conversational dyspnea.  Vital signs in the ED were notable for the following: Afebrile; sinus tachycardia, with heart rates in the low 100s; respiratory rate 19-24, and oxygen saturation saturations in the mid 90s on room air.  Labs were notable for CBC, reflecting labeled cell count of 13,700 compared to most recent prior value of 11,200 in September 2023.  She notes no recent use of systemic corticosteroids.  COVID, influenza, RSV PCR were all negative.  Imaging notable for chest x-ray, which reportedly showed multifocal opacities, potentially representing pneumonia.  Medications administered prior to transfer included the following: Solumedrol, IV magnesium duo nebulizer treatments, albuterol nebulizer, Rocephin, azithromycin.    Subsequently, I accepted this patient for transfer for inpatient admission to a med/tele bed at [facility] or [facility] (first available) for further work-up and management of the above.       Check [email or web address] for on-call coverage.   Nursing staff, Please call [place]  number on Amion as soon as patient's arrival, so appropriate admitting provider can evaluate the pt.     Babs Bertin, DO Hospitalist

## 2022-11-09 NOTE — ED Provider Notes (Signed)
[facility] Provider Note   CSN: [id number] Arrival date & time: 11/09/22  1758     History Chief Complaint  Patient presents with   Cough    HPI ALEXYSS BALZARINI is a 62 y.o. female presenting for chief complaint of shortness of breath.  She is a 62 year old female with an extensive medical history.  States history of COPD secondary to sarcoidosis, hypertension hyperlipidemia, diabetes heart failure, asthma, obesity.  States she has had 2 weeks of fever cough congestion with rapid progression over the last 24 hours.  Otherwise ambulatory with poor p.o. intake. No known sick contacts..   Patient's recorded medical, surgical, social, medication list and allergies were reviewed in the Snapshot window as part of the initial history.   Review of Systems   Review of Systems  Constitutional:  Negative for chills and fever.  HENT:  Negative for ear pain and sore throat.   Eyes:  Negative for pain and visual disturbance.  Respiratory:  Positive for shortness of breath and wheezing. Negative for cough.   Cardiovascular:  Negative for chest pain and palpitations.  Gastrointestinal:  Negative for abdominal pain and vomiting.  Genitourinary:  Negative for dysuria and hematuria.  Musculoskeletal:  Negative for arthralgias and back pain.  Skin:  Negative for color change and rash.  Neurological:  Negative for seizures and syncope.  All other systems reviewed and are negative.   Physical Exam Updated Vital Signs BP (!) 152/96 (BP Location: Left Arm)   Pulse (!) 104   Temp 98.1 F (36.7 C) (Oral)   Resp (!) 24   Ht '5\' 6"'$  (1.676 m)   Wt 95.9 kg   SpO2 95%   BMI 34.12 kg/m  Physical Exam Vitals and nursing note reviewed.  Constitutional:      General: She is not in acute distress.    Appearance: She is well-developed.  HENT:     Head: Normocephalic and atraumatic.  Eyes:     Conjunctiva/sclera: Conjunctivae normal.  Cardiovascular:      Rate and Rhythm: Normal rate and regular rhythm.     Heart sounds: No murmur heard. Pulmonary:     Effort: Pulmonary effort is normal. No respiratory distress.     Breath sounds: Wheezing and rhonchi present.  Abdominal:     General: There is no distension.     Palpations: Abdomen is soft.     Tenderness: There is no abdominal tenderness. There is no right CVA tenderness or left CVA tenderness.  Musculoskeletal:        General: No swelling or tenderness. Normal range of motion.     Cervical back: Neck supple.  Skin:    General: Skin is warm and dry.  Neurological:     General: No focal deficit present.     Mental Status: She is alert and oriented to person, place, and time. Mental status is at baseline.     Cranial Nerves: No cranial nerve deficit.      ED Course/ Medical Decision Making/ A&P Clinical Course as of 11/09/22 2319  Tue Nov 09, 2022  2036 Reassess [CC]    Clinical Course User Index [CC] Tretha Sciara, MD    Procedures Procedures   Medications Ordered in ED Medications  ipratropium-albuterol (DUONEB) 0.5-2.5 (3) MG/3ML nebulizer solution 3 mL (3 mLs Nebulization Given 11/09/22 1859)  cefTRIAXone (ROCEPHIN) 1 g in sodium chloride 0.9 % 100 mL IVPB (1 g Intravenous New Bag/Given 11/09/22 2310)  azithromycin (ZITHROMAX) 500  mg in sodium chloride 0.9 % 250 mL IVPB (has no administration in time range)  methylPREDNISolone sodium succinate (SOLU-MEDROL) 125 mg/2 mL injection 125 mg (125 mg Intravenous Given 11/09/22 1845)  magnesium sulfate IVPB 2 g 50 mL (0 g Intravenous Stopped 11/09/22 1938)  albuterol (PROVENTIL) (2.5 MG/3ML) 0.083% nebulizer solution 2.5 mg (2.5 mg Nebulization Given 11/09/22 1826)  ipratropium-albuterol (DUONEB) 0.5-2.5 (3) MG/3ML nebulizer solution 3 mL (3 mLs Nebulization Given 11/09/22 1827)    Medical Decision Making:   THETA LEAF is a 62 y.o. female with a history of COPD, who presented to the ED today with acute on chronic SOB.  They are endorsing worsening of their baseline dyspnea over the past 72 hours. Their baseline is a 0L O2 requirement. At their baseline they are able to get around the neighborhood and they are not able to at this time.   On my initial exam, the pt was SOB and tachypneic. Audible wheezing and grossly decreased breath sounds appreciated.  They are endorsing increased sputum production.    Reviewed and confirmed nursing documentation for past medical history, family history, social history.    Initial Assessment:   With the patient's presentation of SOB in the above setting, most likely diagnosis is COPD Exacerbation. Other diagnoses were considered including (but not limited to) CAP, PE, ACS, viral infection, PTX. These are considered less likely due to history of present illness and physical exam findings.   This is most consistent with an acute life/limb threatening illness complicated by underlying chronic conditions.  Initial Plan:  Empiric treatment of patient's symptoms with immediate initiation of inhaled bronchodilators and IV steroids. Given advanced nature of patient's presentation, will proceed with IV magnesium as a rescue therapy.   Evaluation for ACS with EKG and delta troponin  Evaluation for infectious versus intrathoracic abnormality with chest x-ray  Evaluation for volume overload with BNP  Screening labs including CBC and Metabolic panel to evaluate for infectious or metabolic etiology of disease.  Patient's Wells score is low and patient does not warrant further objective evaluation for PE based on consistency of presentation of alternative diagnosis.  Objective evaluation as below reviewed   Initial Study Results:   Laboratory  All laboratory results reviewed without evidence of clinically relevant pathology.    EKG EKG was reviewed independently. Rate, rhythm, axis, intervals all examined and without medically relevant abnormality. ST segments without concerns for  elevations.    Radiology:  All images reviewed independently. Agree with radiology report at this time.   DG Chest Port 1 View  Result Date: 11/09/2022 CLINICAL DATA:  Cough for 1 week. EXAM: PORTABLE CHEST 1 VIEW COMPARISON:  June 05, 2022 FINDINGS: The heart size and mediastinal contours are within normal limits. Low lung volumes are noted. Mild, diffusely increased interstitial lung markings are seen. There is no evidence of focal consolidation, pleural effusion or pneumothorax. A radiopaque surgical clip is seen overlying the expected region of the gastroesophageal junction. Multilevel degenerative changes seen throughout the thoracic spine. IMPRESSION: 1. Low lung volumes and mildly increased interstitial lung markings which are likely, in part, chronic in nature. A superimposed component of interstitial edema cannot be excluded. Electronically Signed   By: Virgina Norfolk M.D.   On: 11/09/2022 19:18    Final Assessment and Plan:   After initiation of medical therapies, patient is grossly improved and no longer in acute distress.    Given the advanced nature of the patient's prenetation, patient will require advanced care and  admission was arranged.      Clinical Impression:  1. Chronic obstructive pulmonary disease, unspecified COPD type ([facility])      Admit  Final Clinical Impression(s) / ED Diagnoses Final diagnoses:  Chronic obstructive pulmonary disease, unspecified COPD type [facility])    Rx / DC Orders ED Discharge Orders     None         Tretha Sciara, MD 11/09/22 2319

## 2022-11-10 DIAGNOSIS — K589 Irritable bowel syndrome without diarrhea: Secondary | ICD-10-CM | POA: Diagnosis present

## 2022-11-10 DIAGNOSIS — K760 Fatty (change of) liver, not elsewhere classified: Secondary | ICD-10-CM | POA: Diagnosis present

## 2022-11-10 DIAGNOSIS — D860 Sarcoidosis of lung: Secondary | ICD-10-CM | POA: Diagnosis present

## 2022-11-10 DIAGNOSIS — E1169 Type 2 diabetes mellitus with other specified complication: Secondary | ICD-10-CM | POA: Diagnosis present

## 2022-11-10 DIAGNOSIS — K219 Gastro-esophageal reflux disease without esophagitis: Secondary | ICD-10-CM | POA: Diagnosis present

## 2022-11-10 DIAGNOSIS — M810 Age-related osteoporosis without current pathological fracture: Secondary | ICD-10-CM | POA: Diagnosis present

## 2022-11-10 DIAGNOSIS — I5032 Chronic diastolic (congestive) heart failure: Secondary | ICD-10-CM | POA: Diagnosis present

## 2022-11-10 DIAGNOSIS — E669 Obesity, unspecified: Secondary | ICD-10-CM | POA: Diagnosis present

## 2022-11-10 DIAGNOSIS — J441 Chronic obstructive pulmonary disease with (acute) exacerbation: Secondary | ICD-10-CM | POA: Diagnosis present

## 2022-11-10 DIAGNOSIS — Z1152 Encounter for screening for COVID-19: Secondary | ICD-10-CM | POA: Diagnosis not present

## 2022-11-10 DIAGNOSIS — Z96653 Presence of artificial knee joint, bilateral: Secondary | ICD-10-CM | POA: Diagnosis present

## 2022-11-10 DIAGNOSIS — J1100 Influenza due to unidentified influenza virus with unspecified type of pneumonia: Secondary | ICD-10-CM | POA: Diagnosis present

## 2022-11-10 DIAGNOSIS — M109 Gout, unspecified: Secondary | ICD-10-CM | POA: Diagnosis present

## 2022-11-10 DIAGNOSIS — T380X5A Adverse effect of glucocorticoids and synthetic analogues, initial encounter: Secondary | ICD-10-CM | POA: Diagnosis present

## 2022-11-10 DIAGNOSIS — F32A Depression, unspecified: Secondary | ICD-10-CM | POA: Diagnosis present

## 2022-11-10 DIAGNOSIS — J9601 Acute respiratory failure with hypoxia: Secondary | ICD-10-CM | POA: Diagnosis not present

## 2022-11-10 DIAGNOSIS — D631 Anemia in chronic kidney disease: Secondary | ICD-10-CM | POA: Diagnosis present

## 2022-11-10 DIAGNOSIS — E1122 Type 2 diabetes mellitus with diabetic chronic kidney disease: Secondary | ICD-10-CM | POA: Diagnosis present

## 2022-11-10 DIAGNOSIS — Z794 Long term (current) use of insulin: Secondary | ICD-10-CM | POA: Diagnosis not present

## 2022-11-10 DIAGNOSIS — I130 Hypertensive heart and chronic kidney disease with heart failure and stage 1 through stage 4 chronic kidney disease, or unspecified chronic kidney disease: Secondary | ICD-10-CM | POA: Diagnosis present

## 2022-11-10 DIAGNOSIS — F419 Anxiety disorder, unspecified: Secondary | ICD-10-CM | POA: Diagnosis present

## 2022-11-10 DIAGNOSIS — E785 Hyperlipidemia, unspecified: Secondary | ICD-10-CM | POA: Diagnosis present

## 2022-11-10 DIAGNOSIS — N1832 Chronic kidney disease, stage 3b: Secondary | ICD-10-CM | POA: Diagnosis present

## 2022-11-10 DIAGNOSIS — E1165 Type 2 diabetes mellitus with hyperglycemia: Secondary | ICD-10-CM | POA: Diagnosis present

## 2022-11-10 LAB — CBC WITH DIFFERENTIAL/PLATELET
Abs Immature Granulocytes: 0.34 10*3/uL — ABNORMAL HIGH (ref 0.00–0.07)
Basophils Absolute: 0.1 10*3/uL (ref 0.0–0.1)
Basophils Relative: 0 %
Eosinophils Absolute: 0 10*3/uL (ref 0.0–0.5)
Eosinophils Relative: 0 %
HCT: 32.2 % — ABNORMAL LOW (ref 36.0–46.0)
Hemoglobin: 10.4 g/dL — ABNORMAL LOW (ref 12.0–15.0)
Immature Granulocytes: 3 %
Lymphocytes Relative: 8 %
Lymphs Abs: 0.9 10*3/uL (ref 0.7–4.0)
MCH: 29.7 pg (ref 26.0–34.0)
MCHC: 32.3 g/dL (ref 30.0–36.0)
MCV: 92 fL (ref 80.0–100.0)
Monocytes Absolute: 0.1 10*3/uL (ref 0.1–1.0)
Monocytes Relative: 1 %
Neutro Abs: 10.8 10*3/uL — ABNORMAL HIGH (ref 1.7–7.7)
Neutrophils Relative %: 88 %
Platelets: 343 10*3/uL (ref 150–400)
RBC: 3.5 MIL/uL — ABNORMAL LOW (ref 3.87–5.11)
RDW: 13.7 % (ref 11.5–15.5)
WBC: 12.2 10*3/uL — ABNORMAL HIGH (ref 4.0–10.5)
nRBC: 0 % (ref 0.0–0.2)

## 2022-11-10 LAB — RESPIRATORY PANEL BY PCR
Adenovirus: NOT DETECTED
Bordetella Parapertussis: NOT DETECTED
Bordetella pertussis: NOT DETECTED
Chlamydophila pneumoniae: NOT DETECTED
Coronavirus 229E: NOT DETECTED
Coronavirus HKU1: NOT DETECTED
Coronavirus NL63: NOT DETECTED
Coronavirus OC43: NOT DETECTED
Influenza A H1 2009: DETECTED — AB
Influenza B: NOT DETECTED
Metapneumovirus: NOT DETECTED
Mycoplasma pneumoniae: NOT DETECTED
Parainfluenza Virus 1: NOT DETECTED
Parainfluenza Virus 2: NOT DETECTED
Parainfluenza Virus 3: NOT DETECTED
Parainfluenza Virus 4: NOT DETECTED
Respiratory Syncytial Virus: NOT DETECTED
Rhinovirus / Enterovirus: NOT DETECTED

## 2022-11-10 LAB — BASIC METABOLIC PANEL
Anion gap: 13 (ref 5–15)
BUN: 19 mg/dL (ref 8–23)
CO2: 25 mmol/L (ref 22–32)
Calcium: 9.2 mg/dL (ref 8.9–10.3)
Chloride: 97 mmol/L — ABNORMAL LOW (ref 98–111)
Creatinine, Ser: 1.25 mg/dL — ABNORMAL HIGH (ref 0.44–1.00)
GFR, Estimated: 48 mL/min — ABNORMAL LOW (ref >60)
Glucose, Bld: 344 mg/dL — ABNORMAL HIGH (ref 70–99)
Potassium: 4.3 mmol/L (ref 3.5–5.1)
Sodium: 135 mmol/L (ref 135–145)

## 2022-11-10 LAB — GLUCOSE, CAPILLARY
Glucose-Capillary: 370 mg/dL — ABNORMAL HIGH (ref 70–99)
Glucose-Capillary: 328 mg/dL — ABNORMAL HIGH (ref 70–99)
Glucose-Capillary: 290 mg/dL — ABNORMAL HIGH (ref 70–99)
Glucose-Capillary: 363 mg/dL — ABNORMAL HIGH (ref 70–99)
Glucose-Capillary: 450 mg/dL — ABNORMAL HIGH (ref 70–99)
Glucose-Capillary: 332 mg/dL — ABNORMAL HIGH (ref 70–99)

## 2022-11-10 LAB — PROCALCITONIN: Procalcitonin: <0.1 ng/mL

## 2022-11-10 MED ORDER — PANTOPRAZOLE SODIUM 40 MG PO TBEC
40.0000 mg | DELAYED_RELEASE_TABLET | Freq: Two times a day (BID) | ORAL | Status: DC
  Administered 2022-11-10 – 2022-11-12 (×5): 40 mg via ORAL
  Filled 2022-11-10 (×5): qty 1

## 2022-11-10 MED ORDER — INSULIN ASPART 100 UNIT/ML IJ SOLN
0.0000 [IU] | Freq: Three times a day (TID) | INTRAMUSCULAR | Status: DC
  Administered 2022-11-10: 11 [IU] via SUBCUTANEOUS
  Administered 2022-11-10 (×2): 20 [IU] via SUBCUTANEOUS
  Administered 2022-11-11: 15 [IU] via SUBCUTANEOUS
  Administered 2022-11-11: 11 [IU] via SUBCUTANEOUS
  Administered 2022-11-11: 7 [IU] via SUBCUTANEOUS
  Administered 2022-11-12: 3 [IU] via SUBCUTANEOUS

## 2022-11-10 MED ORDER — POLYETHYLENE GLYCOL 3350 17 G PO PACK: 17.0000 g | PACK | Freq: Every day | ORAL | Status: DC | PRN

## 2022-11-10 MED ORDER — ATORVASTATIN CALCIUM 40 MG PO TABS
40.0000 mg | ORAL_TABLET | Freq: Every day | ORAL | Status: DC
  Administered 2022-11-10 – 2022-11-12 (×3): 40 mg via ORAL
  Filled 2022-11-10 (×3): qty 1

## 2022-11-10 MED ORDER — FENOFIBRATE 160 MG PO TABS
160.0000 mg | ORAL_TABLET | Freq: Every day | ORAL | Status: DC
  Administered 2022-11-10 – 2022-11-12 (×3): 160 mg via ORAL
  Filled 2022-11-10 (×3): qty 1

## 2022-11-10 MED ORDER — INSULIN ASPART 100 UNIT/ML IJ SOLN
0.0000 [IU] | Freq: Every day | INTRAMUSCULAR | Status: DC
  Administered 2022-11-10 – 2022-11-11 (×2): 4 [IU] via SUBCUTANEOUS

## 2022-11-10 MED ORDER — IPRATROPIUM-ALBUTEROL 0.5-2.5 (3) MG/3ML IN SOLN
3.0000 mL | Freq: Two times a day (BID) | RESPIRATORY_TRACT | Status: DC
  Administered 2022-11-10 – 2022-11-12 (×4): 3 mL via RESPIRATORY_TRACT
  Filled 2022-11-10 (×4): qty 3

## 2022-11-10 MED ORDER — ALBUTEROL SULFATE (2.5 MG/3ML) 0.083% IN NEBU: 2.5000 mg | INHALATION_SOLUTION | RESPIRATORY_TRACT | Status: DC | PRN

## 2022-11-10 MED ORDER — VENLAFAXINE HCL ER 75 MG PO CP24
75.0000 mg | ORAL_CAPSULE | Freq: Two times a day (BID) | ORAL | Status: DC
  Administered 2022-11-10 – 2022-11-12 (×5): 75 mg via ORAL
  Filled 2022-11-10 (×5): qty 1

## 2022-11-10 MED ORDER — HYDROCOD POLI-CHLORPHE POLI ER 10-8 MG/5ML PO SUER
5.0000 mL | Freq: Every evening | ORAL | Status: DC | PRN
  Administered 2022-11-10 – 2022-11-11 (×2): 5 mL via ORAL
  Filled 2022-11-10 (×2): qty 5

## 2022-11-10 MED ORDER — INSULIN GLARGINE-YFGN 100 UNIT/ML ~~LOC~~ SOLN
22.0000 [IU] | Freq: Every day | SUBCUTANEOUS | Status: DC
  Administered 2022-11-10: 22 [IU] via SUBCUTANEOUS
  Filled 2022-11-10 (×2): qty 0.22

## 2022-11-10 MED ORDER — ASPIRIN 81 MG PO TBEC
81.0000 mg | DELAYED_RELEASE_TABLET | Freq: Every day | ORAL | Status: DC
  Administered 2022-11-10 – 2022-11-12 (×3): 81 mg via ORAL
  Filled 2022-11-10 (×3): qty 1

## 2022-11-10 MED ORDER — GUAIFENESIN ER 600 MG PO TB12
600.0000 mg | ORAL_TABLET | Freq: Two times a day (BID) | ORAL | Status: DC
  Administered 2022-11-10 – 2022-11-12 (×5): 600 mg via ORAL
  Filled 2022-11-10 (×5): qty 1

## 2022-11-10 MED ORDER — AZITHROMYCIN 250 MG PO TABS
500.0000 mg | ORAL_TABLET | Freq: Every day | ORAL | Status: DC
  Administered 2022-11-10: 500 mg via ORAL
  Filled 2022-11-10: qty 2

## 2022-11-10 MED ORDER — PROCHLORPERAZINE EDISYLATE 10 MG/2ML IJ SOLN: 5.0000 mg | Freq: Four times a day (QID) | INTRAMUSCULAR | Status: DC | PRN

## 2022-11-10 MED ORDER — SODIUM CHLORIDE 3 % IN NEBU
4.0000 mL | INHALATION_SOLUTION | Freq: Two times a day (BID) | RESPIRATORY_TRACT | Status: DC
  Administered 2022-11-10 – 2022-11-12 (×4): 4 mL via RESPIRATORY_TRACT
  Filled 2022-11-10 (×5): qty 4

## 2022-11-10 MED ORDER — IPRATROPIUM-ALBUTEROL 0.5-2.5 (3) MG/3ML IN SOLN: 3.0000 mL | Freq: Two times a day (BID) | RESPIRATORY_TRACT | Status: DC

## 2022-11-10 MED ORDER — SODIUM CHLORIDE 0.9 % IV SOLN: 500.0000 mg | INTRAVENOUS | Status: DC

## 2022-11-10 MED ORDER — FLUTICASONE FUROATE-VILANTEROL 200-25 MCG/ACT IN AEPB
1.0000 | INHALATION_SPRAY | Freq: Every day | RESPIRATORY_TRACT | Status: DC
  Administered 2022-11-10 – 2022-11-12 (×3): 1 via RESPIRATORY_TRACT
  Filled 2022-11-10: qty 28

## 2022-11-10 MED ORDER — SODIUM CHLORIDE 0.9 % IV SOLN
2.0000 g | INTRAVENOUS | Status: DC
  Administered 2022-11-10: 2 g via INTRAVENOUS
  Filled 2022-11-10: qty 20

## 2022-11-10 MED ORDER — ALLOPURINOL 300 MG PO TABS
300.0000 mg | ORAL_TABLET | Freq: Every day | ORAL | Status: DC
  Administered 2022-11-10 – 2022-11-12 (×3): 300 mg via ORAL
  Filled 2022-11-10 (×3): qty 1

## 2022-11-10 MED ORDER — ACETAMINOPHEN 325 MG PO TABS
650.0000 mg | ORAL_TABLET | Freq: Four times a day (QID) | ORAL | Status: DC | PRN
  Administered 2022-11-11 (×3): 650 mg via ORAL
  Filled 2022-11-10 (×3): qty 2

## 2022-11-10 MED ORDER — METHYLPREDNISOLONE SODIUM SUCC 40 MG IJ SOLR
40.0000 mg | Freq: Two times a day (BID) | INTRAMUSCULAR | Status: DC
  Administered 2022-11-10 – 2022-11-11 (×3): 40 mg via INTRAVENOUS
  Filled 2022-11-10 (×4): qty 1

## 2022-11-10 MED ORDER — BENZONATATE 100 MG PO CAPS: 200.0000 mg | ORAL_CAPSULE | Freq: Three times a day (TID) | ORAL | Status: DC | PRN

## 2022-11-10 MED ORDER — ENOXAPARIN SODIUM 40 MG/0.4ML IJ SOSY
40.0000 mg | PREFILLED_SYRINGE | Freq: Every day | INTRAMUSCULAR | Status: DC
  Administered 2022-11-10 – 2022-11-12 (×3): 40 mg via SUBCUTANEOUS
  Filled 2022-11-10 (×3): qty 0.4

## 2022-11-10 MED ORDER — LINACLOTIDE 145 MCG PO CAPS
290.0000 ug | ORAL_CAPSULE | Freq: Every day | ORAL | Status: DC
  Administered 2022-11-11 – 2022-11-12 (×2): 290 ug via ORAL
  Filled 2022-11-10 (×2): qty 2

## 2022-11-10 MED ORDER — FAMOTIDINE 20 MG PO TABS
20.0000 mg | ORAL_TABLET | Freq: Every day | ORAL | Status: DC
  Administered 2022-11-10 – 2022-11-11 (×2): 20 mg via ORAL
  Filled 2022-11-10 (×2): qty 1

## 2022-11-10 MED ORDER — DILTIAZEM HCL ER COATED BEADS 120 MG PO CP24
120.0000 mg | ORAL_CAPSULE | Freq: Every day | ORAL | Status: DC
  Administered 2022-11-10 – 2022-11-12 (×3): 120 mg via ORAL
  Filled 2022-11-10 (×3): qty 1

## 2022-11-10 MED ORDER — MELATONIN 5 MG PO TABS: 5.0000 mg | ORAL_TABLET | Freq: Every evening | ORAL | Status: DC | PRN

## 2022-11-10 MED ORDER — BUPROPION HCL ER (XL) 150 MG PO TB24
300.0000 mg | ORAL_TABLET | Freq: Every day | ORAL | Status: DC
  Administered 2022-11-11 – 2022-11-12 (×2): 300 mg via ORAL
  Filled 2022-11-10 (×2): qty 2

## 2022-11-10 NOTE — Progress Notes (Signed)
OT Cancellation Note  Patient Details Name: [name] MRN: [id number] DOB: November 14, 1960   Cancelled Treatment:    Reason Eval/Treat Not Completed: Other (comment). OT/PT order received states start tomorrow (11/11/2022) but order MD stated in her note mobilized as tolerated and order also for no restrictions for mobility. Have secure chatted attending MD Bonner Puna) for clarification.  [place], OTR/L [facility] [phone] Office [phone]    Almon Register 11/10/2022, 3:56 PM

## 2022-11-10 NOTE — H&P (Signed)
History and Physical  [name] OMV:[id number] DOB: 02/04/1961 DOA: 11/09/2022  Referring physician: Accepted by Dr. Velia Meyer, [facility], hospitalist service. PCP: [name], MD  Outpatient Specialists: Pulmonary. Patient coming from: Home.  Chief Complaint: Persistent, refractory cough, and shortness of breath  HPI: [name] is a 62 y.o. female with medical history significant for COPD not on oxygen supplementation at baseline, sarcoidosis, hypertension, hyperlipidemia, type 2 diabetes, chronic diastolic CHF, asthma, obesity, who initially presented to [facility] ED at the recommendation of her pulmonologist with complaints of refractory cough despite home treatments x 2 weeks.  Associated with subjective fevers, and poor oral intake.  The patient contacted her outpatient pulmonologist Dr. Vaughan Browner who recommended that she presents to the emergency department for further evaluation and management.  In the ED, chest x-ray revealed multifocal opacities with suspicion for pneumonia.  The patient was started on Rocephin and azithromycin to cover community-acquired pneumonia.  Additionally, she received IV Solu-Medrol, IV magnesium, DuoNeb treatments and albuterol nebulizer treatment.  Dr. Velia Meyer, [facility], was asked to admit.  The patient was accepted and admitted by [facility] to telemetry medical unit as inpatient status.  ED Course: Tmax 98.2.  BP 148/74, pulse 107, respiratory rate 20, O2 saturation 97% on 2 L.  Lab studies remarkable for serum glucose 220, creatinine 1.19 with GFR 51.  BNP 21.  High-sensitivity troponin negative x 2, 5, 4.  WBC 13.7.  Hemoglobin 10.4.  Review of Systems: Review of systems as noted in the HPI. All other systems reviewed and are negative.   Past Medical History:  Diagnosis Date   Abnormal SPEP 04/17/2014   Acute left ankle pain 01/26/2017   ANEMIA-UNSPECIFIED 09/18/2009   Anxiety    Arthritis    Blood transfusion without reported diagnosis    Cataract     CHF (congestive heart failure) (HCC)    Chronic diastolic heart failure, NYHA class 2 (HCC)    Normal LVEDP by May 2018   COPD (chronic obstructive pulmonary disease) ([place])    Depression    DIABETES MELLITUS, TYPE II 08/21/2006   Diabetic osteomyelitis ([place]) 05/29/2015   Fatty liver    Fracture of 5th metatarsal    non union   GERD 62/83/6629   Had fundoplication   GOUT 47/65/4650   Hammer toe of right foot    2-5th toes   Hx of umbilical hernia repair    HYPERLIPIDEMIA 08/21/2006   HYPERTENSION 08/21/2006   Infection of wound due to methicillin resistant Staphylococcus aureus (MRSA)    Internal hemorrhoids    Kidney problem    Multiple allergies 10/14/2016   OBESITY 06/04/2009   Onychomycosis 10/27/2015   Osteomyelitis of left foot ([place]) 05/29/2015   Osteoporosis    Pulmonary sarcoidosis ([place])    Followed locally by pulmonology, but also by Dr. Casper Harrison at [facility]   Right knee pain 01/26/2017   Vocal cord dysfunction    Wears partial dentures    Past Surgical History:  Procedure Laterality Date   ABDOMINAL HYSTERECTOMY     APPENDECTOMY     BLADDER SUSPENSION  11/11/2011   Procedure: TRANSVAGINAL TAPE (TVT) PROCEDURE;  Surgeon: Olga Millers, MD;  Location: [place] [facility];  Service: Gynecology;  Laterality: N/A;   CAROTIDS  02/18/2011   CAROTID DUPLEX; VERTEBRALS ARE PATENT WITH ANTEGRADE FLOW. ICA/CCA RATIO 1.61 ON RIGHT AND 0.75 ON LEFT   CATARACT EXTRACTION, BILATERAL     CHOLECYSTECTOMY  1984   COLONOSCOPY  04/29/2010   [name]  CYSTOSCOPY  11/11/2011   Procedure: CYSTOSCOPY;  Surgeon: Olga Millers, MD;  Location: [facility];  Service: Gynecology;  Laterality: N/A;   EXTUBATION (ENDOTRACHEAL) IN OR N/A 09/23/2017   Procedure: EXTUBATION (ENDOTRACHEAL) IN OR;  Surgeon: Helayne Seminole, MD;  Location: [place];  Service: ENT;  Laterality: N/A;   FIBEROPTIC LARYNGOSCOPY AND TRACHEOSCOPY N/A 09/23/2017   Procedure: FLEXIBLE FIBEROPTIC LARYNGOSCOPY;  Surgeon:  Helayne Seminole, MD;  Location: [place];  Service: ENT;  Laterality: N/A;   FRACTURE SURGERY     foot   [place] Left 10/02/2018   Procedure: HALLUX ARTHRODESIS;  Surgeon: Edrick Kins, DPM;  Location: [facility];  Service: Podiatry;  Laterality: Left;   HAMMER TOE SURGERY Left 10/02/2018   Procedure: HAMMER TOE CORRECTION 2ND 3RD 4RD FIFTH TOE;  Surgeon: Edrick Kins, DPM;  Location: [facility];  Service: Podiatry;  Laterality: Left;   HAMMER TOE SURGERY Right 04/12/2019   Procedure: HAMMER TOE CORRECTION, 2ND, 3RD, 4TH AND 5TH TOES OF RIGHT FOOT;  Surgeon: Edrick Kins, DPM;  Location: [place];  Service: Podiatry;  Laterality: Right;   HAMMER TOE SURGERY Left 10/25/2019   Procedure: HAMMER TOE REPAIR SECOND, THIRD, FOUTH;  Surgeon: Edrick Kins, DPM;  Location: [place] [facility];  Service: Podiatry;  Laterality: Left;   HERNIA REPAIR     I & D EXTREMITY Left 06/27/2015   Procedure: Partial Excision Left Calcaneus, Place Antibiotic Beads, and Wound VAC;  Surgeon: Newt Minion, MD;  Location: [place];  Service: Orthopedics;  Laterality: Left;   JOINT REPLACEMENT     KNEE ARTHROSCOPY     right   LEFT AND RIGHT HEART CATHETERIZATION WITH CORONARY ANGIOGRAM N/A 04/23/2013   Procedure: LEFT AND RIGHT HEART CATHETERIZATION WITH CORONARY ANGIOGRAM;  Surgeon: Leonie Man, MD;  Location: [facility] CATH LAB;  Service: Cardiovascular;  Laterality: N/A;   LEFT HEART CATH AND CORONARY ANGIOGRAPHY N/A 03/11/2017   Procedure: Left Heart Cath and Coronary Angiography;  Surgeon: Sherren Mocha, MD;  Location: [place] CV LAB; angiographically minimal CAD in the LAD otherwise normal.;  Normal LVEDP.  FALSE POSITIVE MYOVIEW   LEFT HEART CATH AND CORONARY ANGIOGRAPHY  07/20/2010   LVEF 50-55% WITH VERY MILD GLOBAL HYPOKINESIA; ESSENTIALLY NORMAL CORONARY ARTERIES; NORMAL LV FUNCTION   METATARSAL OSTEOTOMY WITH OPEN REDUCTION INTERNAL FIXATION (ORIF) METATARSAL WITH FUSION Left 04/09/2014   Procedure: LEFT FOOT  FRACTURE OPEN TREATMENT METATARSAL INCLUDES INTERNAL FIXATION EACH;  Surgeon: Lorn Junes, MD;  Location: [place];  Service: Orthopedics;  Laterality: Left;   NISSEN FUNDOPLICATION  6962   NM MYOVIEW LTD --> FALSE POSITIVE  03/10/2017   : Moderate size "stress-induced "perfusion defect at the apex as well as "ill-defined stress-induced perfusion defect in the lateral wall.  EF 55%.  INTERMEDIATE risk. -->  FALSE POSITIVE   ORIF DISTAL FEMUR FRACTURE  08/08/2020   [facility]: ORIF of left extra-articular periprosthetic distal femur fracture with synthesis of the femur plate with cortical nonlocking screws.  (Thought to be pathologic fracture due to osteoporosis) -> after a fall   ORIF FEMUR W/ PERI-IMPLANT  08/29/2020   [facility]) revision of left femur fracture ORIF plate fixation screws; culture positive for MSSA   Right and left CARDIAC CATHETERIZATION  04/23/2013   Angiographic normal coronaries; LVEDP 20 mmHg, PCWP 12-14 mmHg, RAP 12 mmHg.; Fick CO/CI 4.9/2.2   ROTATOR CUFF REPAIR Right 12/09/2021   SHOULDER ARTHROSCOPY Left 03/14/2019  Procedure: LEFT SHOULDER ARTHROSCOPY, DEBRIDEMENT, AND DECOMPRESSION;  Surgeon: Newt Minion, MD;  Location: [place];  Service: Orthopedics;  Laterality: Left;   TOTAL KNEE ARTHROPLASTY Right 06/29/2017   Procedure: RIGHT TOTAL KNEE ARTHROPLASTY;  Surgeon: Newt Minion, MD;  Location: [place];  Service: Orthopedics;  Laterality: Right;   TOTAL KNEE ARTHROPLASTY Left 12/07/2017   TOTAL KNEE ARTHROPLASTY Left 12/07/2017   Procedure: LEFT TOTAL KNEE ARTHROPLASTY;  Surgeon: Newt Minion, MD;  Location: [place];  Service: Orthopedics;  Laterality: Left;   TRACHEOSTOMY TUBE PLACEMENT N/A 09/20/2017   Procedure: AWAKE INTUBATION WITH ANESTHESIA WITH VIDEO ASSISTANCE;  Surgeon: Helayne Seminole, MD;  Location: [place] [facility];  Service: ENT;  Laterality: N/A;   TRANSTHORACIC ECHOCARDIOGRAM  08/2014   Normal LV size and  function.  Mild LVH.  EF 55-60%.  Normal regional wall motion.  GR 1 DD.  Normal RV size and function .   TRANSTHORACIC ECHOCARDIOGRAM  08/12/2020    [facility]): Normal LV size and function.  EF 55 to 60%.  No RWM A.  Normal filling pattern.  Normal RV.  No significant valvular disease-stenosis or regurgitation.  No vegetation.   TUBAL LIGATION     with reversal in [place]      Social History:  reports that she has never smoked. She has never used smokeless tobacco. She reports that she does not drink alcohol and does not use drugs.   Allergies  Allergen Reactions   Methotrexate Other (See Comments)    Peri-oral and buccal lesions   Vancomycin Other (See Comments)    DOSE RELATED NEPHROTOXICITY Other reaction(s): other   Lisinopril Cough    Other reaction(s): other   Chlorhexidine Itching   Clindamycin/Lincomycin Nausea And Vomiting and Rash   Lincomycin Nausea Only and Rash    Other reaction(s): vomiting   Teflaro [Ceftaroline] Rash and Other (See Comments)    Tolerates ceftriaxone     Family History  Problem Relation Age of Onset   Diabetes Father    Heart attack Father 37   Coronary artery disease Father    Heart failure Father    Throat cancer Father    Hypertension Father    Heart disease Father    Sleep apnea Father    Obesity Father    COPD Mother    Emphysema Mother    Asthma Mother    Heart failure Mother    Breast cancer Mother    Diabetes Mother    Kidney disease Mother    Thyroid disease Mother    Heart attack Maternal Grandfather    Sarcoidosis Maternal Uncle    Lung cancer Brother    Diabetes Brother    Colon cancer Neg Hx    Colon polyps Neg Hx    Esophageal cancer Neg Hx    Rectal cancer Neg Hx    Stomach cancer Neg Hx       Prior to Admission medications   Medication Sig Start Date End Date Taking? Authorizing Provider  albuterol (PROVENTIL HFA;VENTOLIN HFA) 108 (90  Base) MCG/ACT inhaler Inhale 1-2 puffs into the lungs every 6 (six) hours as needed for wheezing or shortness of breath. 01/04/19   [name], MD  albuterol (PROVENTIL) (2.5 MG/3ML) 0.083% nebulizer solution Take 2.5 mg by nebulization every 6 (six) hours as needed for wheezing or shortness of breath.    [provider]  allopurinol (ZYLOPRIM)  300 MG tablet Take 1 tablet (300 mg total) by mouth daily. 09/08/22   Aundra Dubin, PA-C  aspirin EC 81 MG tablet Take 1 tablet (81 mg total) by mouth daily. 11/03/20   Leonie Man, MD  atorvastatin (LIPITOR) 40 MG tablet TAKE 1 TABLET BY MOUTH EVERY DAY 09/09/22   [name], MD  benzonatate (TESSALON PERLES) 100 MG capsule Take 1-2 capsules (100-200 mg total) by mouth 3 (three) times daily as needed for cough. 11/04/22   Hudnell, Colletta Maryland, NP  BREO ELLIPTA 200-25 MCG/INH AEPB TAKE 1 PUFF BY MOUTH EVERY DAY Patient taking differently: Inhale 1 puff into the lungs daily. 06/04/19   [name], MD  buPROPion (WELLBUTRIN XL) 300 MG 24 hr tablet TAKE 1 TABLET BY MOUTH EVERY DAY 01/04/22   [name], MD  carvedilol (COREG) 25 MG tablet Take 1 tablet by mouth daily.    [provider]  chlorpheniramine-HYDROcodone (TUSSIONEX) 10-8 MG/5ML Take 5 mLs by mouth at bedtime as needed for cough (do not drive for 8 hours after taking). 11/04/22   Jeanie Sewer, NP  Cholecalciferol 25 MCG (1000 UT) capsule Take 1,000 Units by mouth in the morning and at bedtime. 10/26/11   [provider]  Continuous Blood Gluc Sensor (DEXCOM G7 SENSOR) MISC 1 Device by Does not apply route as directed. Change sensor every 10 days 10/14/22   Elayne Snare, MD  cyclobenzaprine (FLEXERIL) 5 MG tablet Take 1 tablet (5 mg total) by mouth 3 (three) times daily as needed for muscle spasms (or Headache). 11/04/22   Jeanie Sewer, NP  diclofenac Sodium (VOLTAREN) 1 % GEL Apply 1 application topically as needed (pain.).     [provider]  diltiazem (CARDIZEM CD) 120 MG 24 hr capsule TAKE 1 CAPSULE BY MOUTH EVERY DAY 01/28/22   Leonie Man, MD  Dulaglutide (TRULICITY) 4.5 EX/5.2WU SOPN Inject 4.5 mg as directed once a week. 02/02/22   Renato Shin, MD  famotidine (PEPCID) 20 MG tablet Take 1 tablet (20 mg total) by mouth at bedtime. 12/13/18   Willia Craze, NP  fenofibrate 160 MG tablet TAKE 1 TABLET BY MOUTH EVERY DAY 01/04/22   Leonie Man, MD  fluticasone [facility]) 50 MCG/ACT nasal spray Place 2 sprays into both nostrils daily as needed for allergies. 01/22/20   [name], MD  furosemide (LASIX) 40 MG tablet Take 1 tablet (40 mg total) by mouth daily. 06/12/22   Cherene Altes, MD  gabapentin (NEURONTIN) 300 MG capsule Take at night in addition to the '100mg'$  gabapentin to get '400mg'$  at night and '100mg'$  in the morning and at mid day. 03/29/22   Gregor Hams, MD  glucose blood (CONTOUR NEXT TEST) test strip 1 each by Other route 4 (four) times daily. And lancets 4/day 09/19/19   Renato Shin, MD  insulin aspart (NOVOLOG FLEXPEN) 100 UNIT/ML FlexPen Inject 15 Units into the skin daily with supper. 11/06/21   Renato Shin, MD  insulin glargine (LANTUS SOLOSTAR) 100 UNIT/ML Solostar Pen Inject 16 Units into the skin every morning. Patient taking differently: Inject 18 Units into the skin every morning. 07/30/21   Renato Shin, MD  Insulin Pen Needle (PEN NEEDLES) 32G X 4 MM MISC 1 each by Does not apply route 4 (four) times daily. E11.9 02/22/20   Renato Shin, MD  ipratropium (ATROVENT) 0.06 % nasal spray PLACE 2 SPRAYS INTO BOTH NOSTRILS 4 TIMES DAILY. 09/30/22   [name], MD  Lancet Devices (EASY MINI EJECT LANCING DEVICE) MISC Use as directed 4 times daily to test blood sugar 11/04/20   Renato Shin, MD  LINZESS 290 MCG CAPS capsule TAKE 1 CAPSULE BY MOUTH DAILY BEFORE BREAKFAST. Patient taking differently: Take 290 mcg by mouth daily as needed (IBS symptoms). 09/07/21   Irene Shipper, MD   meloxicam (MOBIC) 15 MG tablet Take 1 tablet (15 mg total) by mouth daily. 06/24/21   Marybelle Killings, MD  metFORMIN (GLUCOPHAGE-XR) 500 MG 24 hr tablet Take 4 tablets (2,000 mg total) by mouth daily with breakfast. 05/05/22   Elayne Snare, MD  Microlet Lancets MISC 1 each by Does not apply route 2 (two) times daily. E11.9 09/19/19   Renato Shin, MD  ondansetron (ZOFRAN-ODT) 4 MG disintegrating tablet Take 1 tablet (4 mg total) by mouth every 8 (eight) hours as needed for nausea or vomiting. 11/17/21   Renato Shin, MD  pantoprazole (PROTONIX) 40 MG tablet Take 1 tablet (40 mg total) by mouth 2 (two) times daily. 09/07/22   Irene Shipper, MD  potassium chloride SA (KLOR-CON M20) 20 MEQ tablet Take 1.5 tablets (30 mEq total) by mouth daily. 06/12/22   Cherene Altes, MD  telmisartan (MICARDIS) 20 MG tablet TAKE 1 TABLET BY MOUTH EVERY DAY 07/12/22   [name], MD  venlafaxine XR (EFFEXOR-XR) 75 MG 24 hr capsule TAKE 1 CAPSULE BY MOUTH TWICE A DAY 01/28/22   [name], MD  mupirocin nasal ointment (BACTROBAN) 2 % Place 1 application into the nose 2 (two) times daily. Use one-half of tube in each nostril twice daily for five (5) days. After application, press sides of nose together and gently massage. 03/28/18 03/28/18  [name], MD    Physical Exam: BP (!) 148/74 (BP Location: Left Arm)   Pulse (!) 107   Temp 98.2 F (36.8 C) (Oral)   Resp 20   Ht '5\' 6"'$  (1.676 m)   Wt 97 kg   SpO2 97%   BMI 34.51 kg/m   General: 62 y.o. year-old female well developed well nourished in no acute distress.  Alert and oriented x3. Cardiovascular: Regular rate and rhythm with no rubs or gallops.  No thyromegaly or JVD noted.  No lower extremity edema. 2/4 pulses in all 4 extremities. Respiratory: Diffuse wheezing bilaterally. Good inspiratory effort. Abdomen: Soft nontender nondistended with normal bowel sounds x4 quadrants. Muskuloskeletal: No cyanosis, clubbing or edema noted  bilaterally Neuro: CN II-XII intact, strength, sensation, reflexes Skin: No ulcerative lesions noted or rashes Psychiatry: Judgement and insight appear normal. Mood is appropriate for condition and setting          Labs on Admission:  Basic Metabolic Panel: Recent Labs  Lab 11/09/22 1850  NA 136  K 4.1  CL 96*  CO2 29  GLUCOSE 220*  BUN 18  CREATININE 1.19*  CALCIUM 9.6   Liver Function Tests: Recent Labs  Lab 11/09/22 1850  AST 32  ALT 36  ALKPHOS 99  BILITOT 0.5  PROT 7.1  ALBUMIN 4.3   No results for input(s): "LIPASE", "AMYLASE" in the last 168 hours. No results for input(s): "AMMONIA" in the last 168 hours. CBC: Recent Labs  Lab 11/09/22 1850  WBC 13.7*  NEUTROABS 8.9*  HGB 10.4*  HCT 30.9*  MCV 90.6  PLT 318   Cardiac Enzymes: No results for input(s): "CKTOTAL", "CKMB", "CKMBINDEX", "TROPONINI" in the last 168 hours.  BNP (last 3 results) Recent Labs  11/09/22 1850  BNP 21.0    ProBNP (last 3 results) No results for input(s): "PROBNP" in the last 8760 hours.  CBG: Recent Labs  Lab 11/10/22 0150  GLUCAP 370*    Radiological Exams on Admission: DG Chest Port 1 View  Result Date: 11/09/2022 CLINICAL DATA:  Cough for 1 week. EXAM: PORTABLE CHEST 1 VIEW COMPARISON:  June 05, 2022 FINDINGS: The heart size and mediastinal contours are within normal limits. Low lung volumes are noted. Mild, diffusely increased interstitial lung markings are seen. There is no evidence of focal consolidation, pleural effusion or pneumothorax. A radiopaque surgical clip is seen overlying the expected region of the gastroesophageal junction. Multilevel degenerative changes seen throughout the thoracic spine. IMPRESSION: 1. Low lung volumes and mildly increased interstitial lung markings which are likely, in part, chronic in nature. A superimposed component of interstitial edema cannot be excluded. Electronically Signed   By: Virgina Norfolk M.D.   On: 11/09/2022  19:18    EKG: I independently viewed the EKG done and my findings are as followed: Sinus rhythm rate of 96.  Nonspecific ST-T changes.  QTc 445.  Assessment/Plan Present on Admission:  Acute exacerbation of chronic obstructive pulmonary disease (COPD) (HCC)  Principal Problem:   Acute exacerbation of chronic obstructive pulmonary disease (COPD) (HCC)  Acute exacerbation of COPD Unknown trigger IV Solu-Medrol 40 mg twice daily Bronchodilators Incentive spirometer Mucinex 600 mg twice daily x 3 days, saline nebs twice daily x 3 days. Rocephin, IV azithromycin, started in the ED, continue Maintain O2 saturation greater than 90%  Possible early CAP, POA IV antibiotics Bronchodilators Incentive spirometer Mobilize as tolerated  Chronic diastolic CHF Euvolemic on exam BNP low Goal-directed medical therapy prior to dc Strict I's and O's and daily weight  Type 2 diabetes with hyperglycemia Serum glucose 220 Hemoglobin A1c done on 10/14/2022, 8.1. Heart healthy carb modified diet Insulin sliding scale.  CKD 3B, baseline Avoid nephrotoxic agents, dehydration and hypotension. Monitor urine output Repeat BMP in the morning  Anemia of chronic disease in the setting of CKD 3B No reported overt bleeding Hemoglobin 10.4 Monitor H&H.  Obesity BMI 34 Recommend weight loss outpatient with regular physical activity and healthy dieting.    DVT prophylaxis: Subcu Lovenox daily  Code Status: Full code  Family Communication: None at bedside  Disposition Plan: Admitted to telemetry medical unit  Consults called: None.  Admission status: Inpatient status   Status is: Inpatient The patient requires at least 2 midnights for further evaluation and treatment of present condition.   Kayleen Memos MD [facility] Pager [phone]  If 7PM-7AM, please contact night-coverage [email or web address] Password [email or web address]  11/10/2022, 2:01 AM

## 2022-11-10 NOTE — Inpatient Diabetes Management (Signed)
Inpatient Diabetes Program Recommendations  AACE/ADA: New Consensus Statement on Inpatient Glycemic Control (2015)  Target Ranges:  Prepandial:   less than 140 mg/dL      Peak postprandial:   less than 180 mg/dL (1-2 hours)      Critically ill patients:  140 - 180 mg/dL   Lab Results  Component Value Date   GLUCAP 290 (H) 11/10/2022   HGBA1C 8.1 (H) 10/12/2022    Review of Glycemic Control  Latest Reference Range & Units 11/10/22 01:50 11/10/22 04:15 11/10/22 07:57  Glucose-Capillary 70 - 99 mg/dL 370 (H) 328 (H) 290 (H)  (H): Data is abnormally high  Diabetes history: DM Outpatient Diabetes medications:  Dexcom CGM Trulicity 4.5 mg weekly Lantus 18 units QD Metformin 2000 mg QAM Current orders for Inpatient glycemic control:  Novolog 0-20 units TID and 0-5 units QHS Solumedrol 40 mg Q12H  Inpatient Diabetes Program Recommendations:    Please consider:  Semglee 15 units QD (0.15 units x 97 kg).  Will continue to follow while inpatient.  Thank you, Reche Dixon, MSN, [place] Diabetes Coordinator Inpatient Diabetes Program [phone] (team pager from 8a-5p)

## 2022-11-10 NOTE — TOC Initial Note (Signed)
Transition of Care [facility]) - Initial/Assessment Note    Patient Details  Name: [name] MRN: [id number] Date of Birth: 24-Feb-1961  Transition of Care [facility]) CM/SW Contact:    Bethena Roys, RN Phone Number: 11/10/2022, 1:54 PM  Clinical Narrative:  Risk for readmission assessment completed. PTA patient was independent from home with spouse support. Patient reports that she has DME: cane, rolling walker, and bedside commode. Patient states she has no issues getting to doctors appointments and getting medications. Case Manager will continue to follow for transition of care needs as the patient progresses.                 Expected Discharge Plan: Home/Self Care Barriers to Discharge: Continued Medical Work up   Patient Goals and CMS Choice Patient states their goals for this hospitalization and ongoing recovery are:: to return home with spouse.   Choice offered to / list presented to : NA      Expected Discharge Plan and Services   Discharge Planning Services: CM Consult Post Acute Care Choice: NA Living arrangements for the past 2 months: Single Family Home                   DME Agency: NA  Prior Living Arrangements/Services Living arrangements for the past 2 months: Single Family Home Lives with:: Spouse Patient language and need for interpreter reviewed:: Yes Do you feel safe going back to the place where you live?: Yes      Need for Family Participation in Patient Care: Yes (Comment) Care giver support system in place?: Yes (comment) Current home services: DME (ptient has dme cane, rolling walker, and bedside commode.) Criminal Activity/Legal Involvement Pertinent to Current Situation/Hospitalization: No - Comment as needed  Activities of Daily Living Home Assistive Devices/Equipment: None ADL Screening (condition at time of admission) Patient's cognitive ability adequate to safely complete daily activities?: Yes Is the patient deaf or have  difficulty hearing?: No Does the patient have difficulty seeing, even when wearing glasses/contacts?: No Does the patient have difficulty concentrating, remembering, or making decisions?: No Patient able to express need for assistance with ADLs?: Yes Does the patient have difficulty dressing or bathing?: No Independently performs ADLs?: Yes (appropriate for developmental age) Does the patient have difficulty walking or climbing stairs?: No Weakness of Legs: None Weakness of Arms/Hands: None  Permission Sought/Granted Permission sought to share information with : Family Supports, Case Manager   Emotional Assessment Appearance:: Appears stated age Attitude/Demeanor/Rapport: Engaged Affect (typically observed): Appropriate Orientation: : Oriented to Situation, Oriented to  Time, Oriented to Place, Oriented to Self Alcohol / Substance Use: Not Applicable Psych Involvement: No (comment)  Admission diagnosis:  Acute exacerbation of chronic obstructive pulmonary disease (COPD) ([place]) [J44.1] Chronic obstructive pulmonary disease, unspecified COPD type ([place]) [J44.9] Patient Active Problem List   Diagnosis Date Noted   Acute exacerbation of chronic obstructive pulmonary disease (COPD) ([place]) 11/09/2022   Class 2 obesity 06/06/2022   Chronic pain of left wrist 04/19/2022   Preop cardiovascular exam 12/07/2021   Facet arthropathy, cervical 06/29/2021   Foraminal stenosis of cervical region 06/29/2021   Class II obesity 02/02/2021   Iron deficiency anemia 10/01/2020   Postural dizziness with presyncope 09/30/2020   Hypertriglyceridemia 04/02/2020   Aortic atherosclerosis ([place]) 02/14/2020   Nontraumatic complete tear of left rotator cuff    Diabetic neuropathy ([place]) 02/20/2019   Severe persistent asthma 01/04/2019   Subcortical microvascular ischemic occlusive disease 07/12/2018   Rapid palpitations 07/12/2018  Impingement of left ankle joint 06/26/2018   Constipation 04/21/2018   Total  knee replacement status, left 12/07/2017   Dysphagia 09/20/2017   Epiglottitis    Chronic diastolic CHF (congestive heart failure) ([place]) 08/13/2017   Plantar fasciitis, right 07/13/2017   S/P total knee arthroplasty, right 06/29/2017   Osteoarthrosis, localized, primary, knee, right    Sprain of calcaneofibular ligament of right ankle 06/06/2017   Osteoarthritis of right knee 01/26/2017   Onychomycosis 10/27/2015   CKD (chronic kidney disease) stage 3, GFR 30-59 ml/min (HCC) 04/27/2015   MRSA (methicillin resistant staph aureus) culture positive 03/27/2015   History of osteomyelitis 03/27/2015   Fatty liver 09/30/2014   Hot flashes 07/15/2014   Abnormal SPEP 04/17/2014   Fracture of left leg 04/17/2014   Internal hemorrhoids    Pulmonary sarcoidosis ([place])    Major depression in full remission ([place])    Preoperative clearance 03/25/2014   COPD with acute exacerbation ([place]) 09/18/2013   Chest pain 04/11/2013   Hypertensive heart disease with chronic diastolic congestive heart failure ([place])    Solitary pulmonary nodule, on CT 02/2013 - stable over 2 years in 2015 02/20/2013   Gout 08/20/2010   Deficiency anemia 09/18/2009   Sleep apnea 04/21/2009   Sarcoidosis of lung ([place]) 04/10/2007   Hyperlipidemia associated with type 2 diabetes mellitus ([place]) 08/21/2006   Hypertension associated with diabetes ([place]) 08/21/2006   GERD 08/21/2006   Type II diabetes mellitus with renal manifestations ([place]) 08/21/2006   Uncontrolled type 2 diabetes mellitus with hyperglycemia ([place]) 08/21/2006   PCP:  [name], MD Pharmacy:   [name] [place] [place] - [address] [place] [address] Phone: [phone] Fax: [phone]  [name] [place] [place] [address]  [address] Phone: [phone] Fax: [phone]  PATIENT [place], [place] - [address] [place] [place]  [address] Phone: [phone] Fax: [phone]  [place], [place] [address] Phone: [phone] Fax: [phone]  [id number] [place], [address] AT [name] [address] Phone: [phone] Fax: [phone]  [name] [place] [address]: [phone]: [address]Phone: [phone]: [phone] [name] [place] [address] [place]one: [phone]: [phone] [address]one: [phone]: [phone]   Social Determinants of Health ([place] History: SDOH Screenings   Food Insecurity: No Food Insecurity (11/10/2022)  Housing: Low Risk  (11/10/2022)  Transportation Needs: No Transportation Needs (11/10/2022)  Utilities: Not At Risk (11/10/2022)  Depression (PHQ2-9): Low Risk  (11/04/2022)  Financial Resource Strain: Low Risk  (02/15/2022)  Physical Activity: Sufficiently Active (02/15/2022)  Social Connections: Moderately Integrated (02/15/2022)  Stress: No Stress Concern Present (02/15/2022)  Tobacco Use: Low Risk  (11/09/2022)   Readmission Risk Interventions    11/10/2022    1:49 PM  Readmission Risk Prevention Plan  Transportation Screening Complete  [place] or Home Care Consult Complete  Social Work Consult for [place] Planning/Counseling Complete  Palliative Care Screening Not Applicable  Medication Review (RN Care Manager) Referral to Pharmacy

## 2022-11-10 NOTE — Progress Notes (Signed)
[facility] PROGRESS NOTE  [name] (DOB: Mar 22, 1961) [id number] PCP: [name] Olp, MD Outpatient Specialists: Pulmonary, Dr. Vaughan [name].   Brief Narrative: [name] is a 62 y.o. female with a history of asthma, sarcoidosis, HTN, HLD, T2DM, chronic HFpEF, , and obesity who presented to the ED on 11/09/2022 on the advice of her pulmonologist for persistent cough, fevers, and poor oral intake. She was afebrile, tachypneic and wheezing on 2L O2 with leukocytosis WBC (13.7k) and multifocal opacities on CXR suggestive of pneumonia. SARS-CoV-2, RSV, influenza PCR's negative. Antibiotics and steroids started and the patient was admitted technically early this morning by Dr. [place] [name].   Subjective: Breathing about the same (seen a few hours after admission). No chest pain.  Objective: BP (!) 154/60 (BP Location: Left Arm)   Pulse (!) 104   Temp 98 F (36.7 C) (Oral)   Resp 18   Ht '5\' 6"'$  (1.676 m)   Wt 97 kg   SpO2 97%   BMI 34.51 kg/m   Gen: Obese, pleasant female in no distress Pulm: Nonlabored with O2. Expiratory wheezes throughout.   CV: RRR, no pitting edema GI: Soft, NT, ND, +BS  Neuro: Alert and oriented. No new focal deficits. Ext: Warm, no deformities Skin: No acute rashes, lesions or ulcers on visualized skin   Assessment & Plan: Acute hypoxic respiratory failure due to acute exacerbation of asthma/COPD due to multifocal pneumonia in patient with sarcoidosis. - Continue ceftriaxone, azithromycin for now. Follow fever curve and cultures. Check PCT. Check full viral panel given her URI symptoms and nonspecific CXR. - Start scheduled duoneb and prn albuterol neb. On Breo-Ellipta.  - Continue IV steroids until wheezing resolving.  - Monitor SpO2, aim to keep >89%. - BNP low, no edema, no vascular congestion on CXR.  - No pleuritic chest pain/leg swelling.  - I reordered home meds as appropriate including glargine insulin (steroid-induced hyperglycemia  noted)  [name] Pour, MD [facility] [email or web address] 11/10/2022, 4:07 PM

## 2022-11-11 DIAGNOSIS — J441 Chronic obstructive pulmonary disease with (acute) exacerbation: Secondary | ICD-10-CM | POA: Diagnosis not present

## 2022-11-11 LAB — BASIC METABOLIC PANEL
Anion gap: 13 (ref 5–15)
BUN: 25 mg/dL — ABNORMAL HIGH (ref 8–23)
CO2: 22 mmol/L (ref 22–32)
Calcium: 9.1 mg/dL (ref 8.9–10.3)
Chloride: 99 mmol/L (ref 98–111)
Creatinine, Ser: 1.1 mg/dL — ABNORMAL HIGH (ref 0.44–1.00)
GFR, Estimated: 56 mL/min — ABNORMAL LOW (ref >60)
Glucose, Bld: 335 mg/dL — ABNORMAL HIGH (ref 70–99)
Potassium: 4.5 mmol/L (ref 3.5–5.1)
Sodium: 134 mmol/L — ABNORMAL LOW (ref 135–145)

## 2022-11-11 LAB — GLUCOSE, CAPILLARY
Glucose-Capillary: 205 mg/dL — ABNORMAL HIGH (ref 70–99)
Glucose-Capillary: 340 mg/dL — ABNORMAL HIGH (ref 70–99)
Glucose-Capillary: 266 mg/dL — ABNORMAL HIGH (ref 70–99)
Glucose-Capillary: 335 mg/dL — ABNORMAL HIGH (ref 70–99)

## 2022-11-11 MED ORDER — OSELTAMIVIR PHOSPHATE 75 MG PO CAPS
75.0000 mg | ORAL_CAPSULE | Freq: Two times a day (BID) | ORAL | Status: DC
  Administered 2022-11-11 – 2022-11-12 (×3): 75 mg via ORAL
  Filled 2022-11-11 (×3): qty 1

## 2022-11-11 MED ORDER — INSULIN ASPART 100 UNIT/ML IJ SOLN
5.0000 [IU] | Freq: Three times a day (TID) | INTRAMUSCULAR | Status: DC
  Administered 2022-11-11 – 2022-11-12 (×4): 5 [IU] via SUBCUTANEOUS

## 2022-11-11 MED ORDER — INSULIN GLARGINE-YFGN 100 UNIT/ML ~~LOC~~ SOLN
25.0000 [IU] | Freq: Every day | SUBCUTANEOUS | Status: DC
  Administered 2022-11-11 – 2022-11-12 (×2): 25 [IU] via SUBCUTANEOUS
  Filled 2022-11-11 (×2): qty 0.25

## 2022-11-11 MED ORDER — METHYLPREDNISOLONE SODIUM SUCC 40 MG IJ SOLR
40.0000 mg | Freq: Every day | INTRAMUSCULAR | Status: DC
  Administered 2022-11-12: 40 mg via INTRAVENOUS
  Filled 2022-11-11: qty 1

## 2022-11-11 NOTE — Progress Notes (Signed)
Mobility Specialist - Progress Note   11/11/22 1445  Mobility  Activity Ambulated independently in room  Level of Assistance Independent  Assistive Device None  Distance Ambulated (ft) 40 ft  Activity Response Tolerated well  Mobility Referral Yes  $Mobility charge 1 Mobility   Pt received in bed and agreeable . No complaint throughout. Pt returned to bed with all needs met.   Franki Monte  Mobility Specialist Please contact via Solicitor or Rehab office at [phone]

## 2022-11-11 NOTE — Progress Notes (Addendum)
[facility] PROGRESS NOTE  [name] (DOB: [birth year]) [id number] PCP: [name], MD Outpatient Specialists: Pulmonary, Dr. Vaughan Browner.   Brief Narrative: [name] is a 62 y.o. female with a history of asthma, sarcoidosis, HTN, HLD, T2DM, chronic HFpEF, , and obesity who presented to the ED on 11/09/2022 on the advice of her pulmonologist for persistent cough, fevers, and poor oral intake. She was afebrile, tachypneic and wheezing on 2L O2 with leukocytosis WBC (13.7k) and multifocal opacities on CXR suggestive of pneumonia. SARS-CoV-2, RSV, influenza PCR's negative. Antibiotics and steroids started and the patient was admitted for asthma/COPD exacerbation. Respiratory virus panel resulted "+influenza A H1 2009." Respiratory status is improving.   Subjective: Able to get up with dyspnea but much improved from admission. Wheezing improved, no chest pain. Not at respiratory baseline.   Objective: BP (!) 155/72 (BP Location: Left Arm)   Pulse 86   Temp 98.2 F (36.8 C) (Oral)   Resp 17   Ht '5\' 6"'$  (1.676 m)   Wt 97 kg   SpO2 98%   BMI 34.51 kg/m   Gen: No distress, nontoxic Pulm: Tachypneic, Improved aeration and decreased wheezes  CV: RRR, no MRG GI: Soft, NT, ND, +BS  Neuro: Alert and oriented. No new focal deficits. Ext: Warm, +clubbing. Skin: No rashes, lesions or ulcers on visualized skin   Assessment & Plan: Acute hypoxic respiratory failure due to acute exacerbation of asthma/COPD due to influenza pneumonia in patient with sarcoidosis. Despite multiple previous negative tests, a full viral panel did reveal influenza A.  - PCT negative, viral cause identified. Stop abx, start tamiflu, will have to dose for renal function.  - Continue scheduled duoneb BID and prn albuterol neb. Continue breo-ellipta.  - Deescalate steroids, wean oxygen. If improvement continues over next 24 hours, anticipate DC home 2/2.  - Monitor SpO2, aim to keep >89%. -  Antitussives - Ambulate  IDT2DM uncontrolled chronically (HbA1c 8.1%) and with acute steroid-induced hyperglycemia.  - Augment basal and bolus insulin dosing again today, deescalate steroids.   GERD:  - Continue PPI, pepcid  IBS:  - Continue linzess  Depression: Quiescent.  - Continue venlafaxine, bupropion.  HLD:  - Continue home Tx  Gout: No flare currently - Continue home allopurinol.  Obesity: Body mass index is 34.51 kg/m.   [name] Pour, MD [facility] [email or web address] 11/11/2022, 11:46 AM

## 2022-11-11 NOTE — Evaluation (Signed)
Physical Therapy Evaluation Patient Details Name: [name] MRN: [id number] DOB: 05-17-1961 Today's Date: 11/11/2022  History of Present Illness  62 y.o. female who presented to the ED on 11/09/2022 on the advice of her pulmonologist for persistent cough, fevers, and poor oral intake. Admitted for treatment of acute hypoxic respiratory failure due to acute exacerbation of asthma/COPD due to multifocal PNA in pt with sarcoidosis.PMH: asthma, sarcoidosis, HTN, HLD, T2DM, chronic HFpEF and obesity  Clinical Impression  Patient evaluated by Physical Therapy with no further acute PT needs identified. All education has been completed and the patient has no further questions. [name] independent with her mobility and will have appropriate assistance at home. Pt has no follow-up Physical Therapy or equipment needs. [name] signing off. Thank you for this referral.        Recommendations for follow up therapy are one component of a multi-disciplinary discharge planning process, led by the attending physician.  Recommendations may be updated based on patient status, additional functional criteria and insurance authorization.  Follow Up Recommendations No PT follow up      Assistance Recommended at Discharge PRN     Equipment Recommendations None recommended by PT     Functional Status Assessment Patient has not had a recent decline in their functional status     Precautions / Restrictions Precautions Precautions: None      Mobility  Bed Mobility Overal bed mobility: Modified Independent             General bed mobility comments: HoB elevated    Transfers Overall transfer level: Modified independent                 General transfer comment: uses bedrail to powerup    Ambulation/Gait Ambulation/Gait assistance: Independent Gait Distance (Feet): 80 Feet           General Gait Details: WFL, able to navigate around significant obstacles in very small room         Balance Overall balance assessment: No apparent balance deficits (not formally assessed)                                           Pertinent Vitals/Pain  Denies pain     Home Living Family/patient expects to be discharged to:: Private residence Living Arrangements: Spouse/significant other Available Help at Discharge: Family;Available 24 hours/day Type of Home: House Home Access: Stairs to enter Entrance Stairs-Rails: Right Entrance Stairs-Number of Steps: 2   Home Layout: One level Home Equipment: Conservation officer, nature (2 wheels);Kasandra Knudsen - single point Additional Comments: husband there most of the time, and when he is not has family that can stay with her    Prior Function Prior Level of Function : Independent/Modified Independent;Driving               ADLs Comments: pt with L hand/wrist in cast for immobilization of her thumb, husband has been assisting with ADL/iADLs but prior to that was independent, cast will come off 2/15     Hand Dominance   Dominant Hand: Right    Extremity/Trunk Assessment   Upper Extremity Assessment Upper Extremity Assessment: LUE deficits/detail LUE Deficits / Details: L wrist/hand in cast to immobilize thumb    Lower Extremity Assessment Lower Extremity Assessment: Overall WFL for tasks assessed    Cervical / Trunk Assessment Cervical / Trunk Assessment: Normal  Communication   Communication:  No difficulties  Cognition Arousal/Alertness: Awake/alert Behavior During Therapy: WFL for tasks assessed/performed Overall Cognitive Status: Within Functional Limits for tasks assessed                                          General Comments General comments (skin integrity, edema, etc.): VSS on RA        Assessment/Plan    PT Assessment Patient does not need any further PT services         PT Goals (Current goals can be found in the Care Plan section)  Acute Rehab PT Goals PT Goal Formulation: All  assessment and education complete, DC therapy     AM-PAC PT "6 Clicks" Mobility  Outcome Measure Help needed turning from your back to your side while in a flat bed without using bedrails?: None Help needed moving from lying on your back to sitting on the side of a flat bed without using bedrails?: None Help needed moving to and from a bed to a chair (including a wheelchair)?: None Help needed standing up from a chair using your arms (e.g., wheelchair or bedside chair)?: None Help needed to walk in hospital room?: None Help needed climbing 3-5 steps with a railing? : None 6 Click Score: 24    End of Session   Activity Tolerance: Patient tolerated treatment well Patient left: in chair;with call bell/phone within reach Nurse Communication: Mobility status PT Visit Diagnosis: Muscle weakness (generalized) (M62.81)    Time: 1116-1140 PT Time Calculation (min) (ACUTE ONLY): 24 min   Charges:   PT Evaluation $PT Eval Moderate Complexity: 1 Mod PT Treatments $Therapeutic Exercise: 8-22 mins        Ephrem Carrick B. Migdalia Dk PT, DPT Acute Rehabilitation Services Please use secure chat or  Call Office [phone]   [place] 11/11/2022, 12:05 PM

## 2022-11-12 ENCOUNTER — Ambulatory Visit: Payer: BC Managed Care – PPO | Admitting: Family Medicine

## 2022-11-12 DIAGNOSIS — J441 Chronic obstructive pulmonary disease with (acute) exacerbation: Secondary | ICD-10-CM | POA: Diagnosis not present

## 2022-11-12 LAB — GLUCOSE, CAPILLARY: Glucose-Capillary: 150 mg/dL — ABNORMAL HIGH (ref 70–99)

## 2022-11-12 MED ORDER — PREDNISONE 20 MG PO TABS: 40.0000 mg | ORAL_TABLET | Freq: Every day | ORAL | 0 refills | Status: DC

## 2022-11-12 MED ORDER — OSELTAMIVIR PHOSPHATE 75 MG PO CAPS: 75.0000 mg | ORAL_CAPSULE | Freq: Two times a day (BID) | ORAL | 0 refills | Status: DC

## 2022-11-12 NOTE — Discharge Summary (Signed)
Physician Discharge Summary   Patient: [name] MRN: [id number] DOB: 02-17-1961  Admit date:     11/09/2022  Discharge date: 11/12/22  Discharge Physician: Patrecia Pour   PCP: [name], MD   Recommendations at discharge:  Follow up with her excellent PCP as scheduled 2/16.  Discharge Diagnoses: Principal Problem:   Acute exacerbation of chronic obstructive pulmonary disease (COPD) [facility])  Hospital Course: [name] is a 62 y.o. female with a history of asthma, sarcoidosis, HTN, HLD, T2DM, chronic HFpEF, , and obesity who presented to the ED on 11/09/2022 on the advice of her pulmonologist for persistent cough, fevers, and poor oral intake. She was afebrile, tachypneic and wheezing on 2L O2 with leukocytosis WBC (13.7k) and multifocal opacities on CXR suggestive of pneumonia. SARS-CoV-2, RSV, influenza PCR's negative. Antibiotics, nebs, and steroids started and the patient was admitted for asthma/COPD exacerbation. Respiratory virus panel resulted "+influenza A H1 2009." Respiratory status is improving on steroids and nebs. Tamiflu started due to severity of illness/infiltrates. She is no longer hypoxemic, has normal respiratory effort and improved exam. She has a nebulizer at home with medications and is stable for discharge.  Assessment and Plan: Acute hypoxic respiratory failure due to acute exacerbation of asthma/COPD due to influenza pneumonia in patient with sarcoidosis. Despite multiple previous negative tests, a full viral panel did reveal influenza A.  - PCT negative, viral cause identified. Stopped abx, started tamiflu, will complete 5 days. - Continue controller. Continue nebs around the clock x48 hours at home.  - Deescalated steroids, weaned oxygen and remains stable.     IDT2DM uncontrolled chronically (HbA1c 8.1%) and with acute steroid-induced hyperglycemia.  - Augmented insulin while on high dose steroids, will abbreviate burst to 5 days given her  improvement in wheezing and hyperglycemia. Advised to adjust insulin if CBGs persistently elevated.   GERD:  - Continue PPI, pepcid   IBS:  - Continue linzess   Depression: Quiescent.  - Continue venlafaxine, bupropion.   HLD:  - Continue home Tx   Gout: No flare currently - Continue home allopurinol.   Obesity: Body mass index is 34.51 kg/m.   Consultants: None Procedures performed: None  Disposition: Home Diet recommendation:  Cardiac and Carb modified diet DISCHARGE MEDICATION: Allergies as of 11/12/2022       Reactions   Firvanq [vancomycin] Other (See Comments)   Dose related nephrotoxicity    Trexall [methotrexate] Other (See Comments)   Peri-oral and buccal lesions   Zestril [lisinopril] Cough   Chlorhexidine Itching   Cleocin [clindamycin] Nausea And Vomiting, Rash   Lincocin [lincomycin Hcl] Nausea And Vomiting, Rash   Teflaro [ceftaroline] Rash, Other (See Comments)   Tolerates ceftriaxone         Medication List     TAKE these medications    acetaminophen 500 MG tablet Commonly known as: TYLENOL Take 1,000 mg by mouth daily as needed for headache, fever, moderate pain or mild pain.   albuterol (2.5 MG/3ML) 0.083% nebulizer solution Commonly known as: PROVENTIL Take 2.5 mg by nebulization every 6 (six) hours as needed for wheezing or shortness of breath.   allopurinol 300 MG tablet Commonly known as: ZYLOPRIM Take 1 tablet (300 mg total) by mouth daily.   aspirin EC 81 MG tablet Take 1 tablet (81 mg total) by mouth daily.   atorvastatin 40 MG tablet Commonly known as: LIPITOR TAKE 1 TABLET BY MOUTH EVERY DAY   benzonatate 100 MG capsule Commonly known as: Best boy  Take 1-2 capsules (100-200 mg total) by mouth 3 (three) times daily as needed for cough. What changed: how much to take   Breo Ellipta 200-25 MCG/ACT Aepb Generic drug: fluticasone furoate-vilanterol TAKE 1 PUFF BY MOUTH EVERY DAY What changed: See the new  instructions.   buPROPion 300 MG 24 hr tablet Commonly known as: WELLBUTRIN XL TAKE 1 TABLET BY MOUTH EVERY DAY   chlorpheniramine-HYDROcodone 10-8 MG/5ML Commonly known as: TUSSIONEX Take 5 mLs by mouth at bedtime as needed for cough (do not drive for 8 hours after taking). What changed: reasons to take this   Contour Next Test test strip Generic drug: glucose blood 1 each by Other route 4 (four) times daily. And lancets 4/day   cyclobenzaprine 5 MG tablet Commonly known as: FLEXERIL Take 1 tablet (5 mg total) by mouth 3 (three) times daily as needed for muscle spasms (or Headache). What changed: reasons to take this   Dexcom G7 Sensor Misc 1 Device by Does not apply route as directed. Change sensor every 10 days   diltiazem 120 MG 24 hr capsule Commonly known as: CARDIZEM CD TAKE 1 CAPSULE BY MOUTH EVERY DAY What changed: how much to take   Easy Mini Eject Lancing Device Misc Use as directed 4 times daily to test blood sugar   estradiol 0.1 MG/GM vaginal cream Commonly known as: ESTRACE Place 0.5 Applicatorfuls vaginally 2 (two) times a week.   fenofibrate 160 MG tablet TAKE 1 TABLET BY MOUTH EVERY DAY   fluticasone 50 MCG/ACT nasal spray Commonly known as: FLONASE Place 2 sprays into both nostrils daily as needed for allergies. What changed:  how much to take when to take this   furosemide 40 MG tablet Commonly known as: LASIX Take 1 tablet (40 mg total) by mouth daily.   gabapentin 300 MG capsule Commonly known as: NEURONTIN Take at night in addition to the '100mg'$  gabapentin to get '400mg'$  at night and '100mg'$  in the morning and at mid day. What changed:  how much to take how to take this when to take this reasons to take this additional instructions   ipratropium 0.06 % nasal spray Commonly known as: ATROVENT PLACE 2 SPRAYS INTO BOTH NOSTRILS 4 TIMES DAILY. What changed: See the new instructions.   Lantus SoloStar 100 UNIT/ML Solostar Pen Generic drug:  insulin glargine Inject 16 Units into the skin every morning. What changed:  how much to take when to take this   Linzess 290 MCG Caps capsule Generic drug: linaclotide TAKE 1 CAPSULE BY MOUTH DAILY BEFORE BREAKFAST.   meloxicam 15 MG tablet Commonly known as: MOBIC Take 1 tablet (15 mg total) by mouth daily.   metFORMIN 500 MG 24 hr tablet Commonly known as: GLUCOPHAGE-XR Take 4 tablets (2,000 mg total) by mouth daily with breakfast. What changed:  how much to take when to take this   Microlet Lancets Misc 1 each by Does not apply route 2 (two) times daily. E11.9   NovoLOG FlexPen 100 UNIT/ML FlexPen Generic drug: insulin aspart Inject 15 Units into the skin daily with supper. What changed:  how much to take when to take this additional instructions   oseltamivir 75 MG capsule Commonly known as: TAMIFLU Take 1 capsule (75 mg total) by mouth 2 (two) times daily.   pantoprazole 40 MG tablet Commonly known as: PROTONIX Take 1 tablet (40 mg total) by mouth 2 (two) times daily.   Pen Needles 32G X 4 MM Misc 1 each by Does not apply  route 4 (four) times daily. E11.9   potassium chloride SA 20 MEQ tablet Commonly known as: Klor-Con M20 Take 1.5 tablets (30 mEq total) by mouth daily. What changed: when to take this   predniSONE 20 MG tablet Commonly known as: DELTASONE Take 2 tablets (40 mg total) by mouth daily with breakfast. Start taking on: November 13, 2022   telmisartan 20 MG tablet Commonly known as: MICARDIS TAKE 1 TABLET BY MOUTH EVERY DAY   Trulicity 4.5 OE/7.0JJ Sopn Generic drug: Dulaglutide Inject 4.5 mg as directed once a week. What changed: when to take this   venlafaxine XR 75 MG 24 hr capsule Commonly known as: EFFEXOR-XR TAKE 1 CAPSULE BY MOUTH TWICE A DAY        Discharge Exam: Filed Weights   11/09/22 1803 11/10/22 0141  Weight: 95.9 kg 97 kg  BP (!) 140/63 (BP Location: Right Arm)   Pulse 91   Temp 98 F (36.7 C) (Oral)   Resp  17   Ht '5\' 6"'$  (1.676 m)   Wt 97 kg   SpO2 92%   BMI 34.51 kg/m   No distress Nonlabored, breathing comfortably without wheezes or crackles. Good air exchange.  RRR, no MRG or pitting edema  Condition at discharge: stable  The results of significant diagnostics from this hospitalization (including imaging, microbiology, ancillary and laboratory) are listed below for reference.   Imaging Studies: DG Chest Port 1 View  Result Date: 11/09/2022 CLINICAL DATA:  Cough for 1 week. EXAM: PORTABLE CHEST 1 VIEW COMPARISON:  June 05, 2022 FINDINGS: The heart size and mediastinal contours are within normal limits. Low lung volumes are noted. Mild, diffusely increased interstitial lung markings are seen. There is no evidence of focal consolidation, pleural effusion or pneumothorax. A radiopaque surgical clip is seen overlying the expected region of the gastroesophageal junction. Multilevel degenerative changes seen throughout the thoracic spine. IMPRESSION: 1. Low lung volumes and mildly increased interstitial lung markings which are likely, in part, chronic in nature. A superimposed component of interstitial edema cannot be excluded. Electronically Signed   By: Virgina Norfolk M.D.   On: 11/09/2022 19:18    Microbiology: Results for orders placed or performed during the hospital encounter of 11/09/22  Resp panel by RT-PCR (RSV, Flu A&B, Covid) Nasopharyngeal Swab     Status: None   Collection Time: 11/09/22  6:09 PM   Specimen: Nasopharyngeal Swab; Nasal Swab  Result Value Ref Range Status   SARS Coronavirus 2 by RT PCR NEGATIVE NEGATIVE Final    Comment: (NOTE) SARS-CoV-2 target nucleic acids are NOT DETECTED.  The SARS-CoV-2 RNA is generally detectable in upper respiratory specimens during the acute phase of infection. The lowest concentration of SARS-CoV-2 viral copies this assay can detect is 138 copies/mL. A negative result does not preclude SARS-Cov-2 infection and should not be used as  the sole basis for treatment or other patient management decisions. A negative result may occur with  improper specimen collection/handling, submission of specimen other than nasopharyngeal swab, presence of viral mutation(s) within the areas targeted by this assay, and inadequate number of viral copies(<138 copies/mL). A negative result must be combined with clinical observations, patient history, and epidemiological information. The expected result is Negative.  Fact Sheet for Patients:  EntrepreneurPulse.com.au  Fact Sheet for Healthcare Providers:  IncredibleEmployment.be  This test is no t yet approved or cleared by the [place] FDA and  has been authorized for detection and/or diagnosis of SARS-CoV-2 by FDA under an Emergency Use  Authorization (EUA). This EUA will remain  in effect (meaning this test can be used) for the duration of the COVID-19 declaration under Section 564(b)(1) of the Act, 21 U.S.C.section 360bbb-3(b)(1), unless the authorization is terminated  or revoked sooner.       Influenza A by PCR NEGATIVE NEGATIVE Final   Influenza B by PCR NEGATIVE NEGATIVE Final    Comment: (NOTE) The Xpert Xpress SARS-CoV-2/FLU/RSV plus assay is intended as an aid in the diagnosis of influenza from Nasopharyngeal swab specimens and should not be used as a sole basis for treatment. Nasal washings and aspirates are unacceptable for Xpert Xpress SARS-CoV-2/FLU/RSV testing.  Fact Sheet for Patients: EntrepreneurPulse.com.au  Fact Sheet for Healthcare Providers: IncredibleEmployment.be  This test is not yet approved or cleared by the [place] FDA and has been authorized for detection and/or diagnosis of SARS-CoV-2 by FDA under an Emergency Use Authorization (EUA). This EUA will remain in effect (meaning this test can be used) for the duration of the COVID-19 declaration under Section 564(b)(1) of  the Act, 21 U.S.C. section 360bbb-3(b)(1), unless the authorization is terminated or revoked.     Resp Syncytial Virus by PCR NEGATIVE NEGATIVE Final    Comment: (NOTE) Fact Sheet for Patients: EntrepreneurPulse.com.au  Fact Sheet for Healthcare Providers: IncredibleEmployment.be  This test is not yet approved or cleared by the [place] FDA and has been authorized for detection and/or diagnosis of SARS-CoV-2 by FDA under an Emergency Use Authorization (EUA). This EUA will remain in effect (meaning this test can be used) for the duration of the COVID-19 declaration under Section 564(b)(1) of the Act, 21 U.S.C. section 360bbb-3(b)(1), unless the authorization is terminated or revoked.  Performed at [facility], [address], [place], [place] [address]   Blood culture (routine x 2)     Status: None (Preliminary result)   Collection Time: 11/09/22 10:50 PM   Specimen: BLOOD  Result Value Ref Range Status   Specimen Description BLOOD BLOOD RIGHT HAND  Final   Special Requests   Final    BOTTLES DRAWN AEROBIC AND ANAEROBIC Blood Culture adequate volume   Culture   Final    NO GROWTH < 24 HOURS Performed at [facility], [place] [address], [place], [place] [address]    Report Status PENDING  Incomplete  Blood culture (routine x 2)     Status: None (Preliminary result)   Collection Time: 11/09/22 10:55 PM   Specimen: BLOOD  Result Value Ref Range Status   Specimen Description BLOOD BLOOD RIGHT FOREARM  Final   Special Requests   Final    BOTTLES DRAWN AEROBIC AND ANAEROBIC Blood Culture adequate volume   Culture   Final    NO GROWTH < 24 HOURS Performed at [facility], [place] [address], [place],  [address]    Report Status PENDING  Incomplete  Respiratory (~20 pathogens) panel by PCR     Status: Abnormal   Collection Time: 11/10/22  5:56 PM   Specimen: Nasopharyngeal Swab; Respiratory  Result  Value Ref Range Status   Adenovirus NOT DETECTED NOT DETECTED Final   Coronavirus 229E NOT DETECTED NOT DETECTED Final    Comment: (NOTE) The Coronavirus on the Respiratory Panel, DOES NOT test for the novel  Coronavirus (2019 nCoV)    Coronavirus HKU1 NOT DETECTED NOT DETECTED Final   Coronavirus NL63 NOT DETECTED NOT DETECTED Final   Coronavirus OC43 NOT DETECTED NOT DETECTED Final   Metapneumovirus NOT DETECTED NOT DETECTED Final  Rhinovirus / Enterovirus NOT DETECTED NOT DETECTED Final   Influenza A H1 2009 DETECTED (A) NOT DETECTED Final   Influenza B NOT DETECTED NOT DETECTED Final   Parainfluenza Virus 1 NOT DETECTED NOT DETECTED Final   Parainfluenza Virus 2 NOT DETECTED NOT DETECTED Final   Parainfluenza Virus 3 NOT DETECTED NOT DETECTED Final   Parainfluenza Virus 4 NOT DETECTED NOT DETECTED Final   Respiratory Syncytial Virus NOT DETECTED NOT DETECTED Final   Bordetella pertussis NOT DETECTED NOT DETECTED Final   Bordetella Parapertussis NOT DETECTED NOT DETECTED Final   Chlamydophila pneumoniae NOT DETECTED NOT DETECTED Final   Mycoplasma pneumoniae NOT DETECTED NOT DETECTED Final    Comment: Performed at [facility], [place] [address] [place], [place] [address]   *Note: Due to a large number of results and/or encounters for the requested time period, some results have not been displayed. A complete set of results can be found in Results Review.    Labs: CBC: Recent Labs  Lab 11/09/22 1850 11/10/22 0409  WBC 13.7* 12.2*  NEUTROABS 8.9* 10.8*  HGB 10.4* 10.4*  HCT 30.9* 32.2*  MCV 90.6 92.0  PLT 318 188   Basic Metabolic Panel: Recent Labs  Lab 11/09/22 1850 11/10/22 0409 11/11/22 0144  NA 136 135 134*  K 4.1 4.3 4.5  CL 96* 97* 99  CO2 '29 25 22  '$ GLUCOSE 220* 344* 335*  BUN 18 19 25*  CREATININE 1.19* 1.25* 1.10*  CALCIUM 9.6 9.2 9.1   Liver Function Tests: Recent Labs  Lab 11/09/22 1850  AST 32  ALT 36  ALKPHOS 99  BILITOT 0.5  PROT  7.1  ALBUMIN 4.3   CBG: Recent Labs  Lab 11/11/22 0741 11/11/22 1151 11/11/22 1609 11/11/22 2123 11/12/22 0810  GLUCAP 205* 340* 266* 335* 150*    Discharge time spent: greater than 30 minutes.  Signed: Patrecia Pour, MD [facility] 11/12/2022

## 2022-11-12 NOTE — Progress Notes (Signed)
Discharge instructions reviewed with pt and her husband.  Copy of instructions given to pt. Pt informed scripts sent to her pharmacy for pick up.  Pt to be d/c'd via wheelchair with belongings, with husband.            To be escorted by staff/hospital volunteer.

## 2022-11-15 ENCOUNTER — Telehealth: Payer: Self-pay

## 2022-11-15 LAB — CULTURE, BLOOD (ROUTINE X 2)
Culture: NO GROWTH
Culture: NO GROWTH
Special Requests: ADEQUATE
Special Requests: ADEQUATE

## 2022-11-15 NOTE — Telephone Encounter (Signed)
Transition Care Management Follow-up Telephone Call Date of discharge and from where:[facility] [place] 11-12-22 Dx: COPD exac   How have you been since you were released from the hospital? Doing little better  Any questions or concerns? No  Items Reviewed: Did the pt receive and understand the discharge instructions provided? Yes  Medications obtained and verified? Yes  Other? No  Any new allergies since your discharge? no Dietary orders reviewed? Yes Do you have support at home? Yes   Home Care and Equipment/Supplies: Were home health services ordered? no If so, what is the name of the agency? na  Has the agency set up a time to come to the patient's home? not applicable Were any new equipment or medical supplies ordered?  No What is the name of the medical supply agency? na Were you able to get the supplies/equipment? not applicable Do you have any questions related to the use of the equipment or supplies? No  Functional Questionnaire: (I = Independent and D = Dependent) ADLs: I- with assistance  Bathing/Dressing- I- with assistance   Meal Prep- I  Eating- I  Maintaining continence- I  Transferring/Ambulation- I  Managing Meds- I  Follow up appointments reviewed:  PCP Hospital f/u appt confirmed? Yes  [facility]? No   Are transportation arrangements needed? no If their condition worsens, is the pt aware to call PCP or go to the Emergency Dept.? yes Was the patient provided with contact information for the PCP's office or ED? Yes Was to pt encouraged to call back with questions or concerns? Yes   Juanda Crumble LPN [facility] Direct Dial [phone]

## 2022-11-15 NOTE — Telephone Encounter (Signed)
Noted thanks.

## 2022-11-18 DIAGNOSIS — M1812 Unilateral primary osteoarthritis of first carpometacarpal joint, left hand: Secondary | ICD-10-CM | POA: Diagnosis not present

## 2022-11-18 DIAGNOSIS — M79642 Pain in left hand: Secondary | ICD-10-CM | POA: Diagnosis not present

## 2022-11-18 DIAGNOSIS — M659 Synovitis and tenosynovitis, unspecified: Secondary | ICD-10-CM | POA: Diagnosis not present

## 2022-11-19 ENCOUNTER — Encounter: Payer: Self-pay | Admitting: Family Medicine

## 2022-11-19 ENCOUNTER — Ambulatory Visit (INDEPENDENT_AMBULATORY_CARE_PROVIDER_SITE_OTHER): Payer: Medicare Other | Admitting: Family Medicine

## 2022-11-19 VITALS — BP 140/70 | HR 70 | Temp 97.6°F | Ht 66.0 in | Wt 214.4 lb

## 2022-11-19 DIAGNOSIS — I5032 Chronic diastolic (congestive) heart failure: Secondary | ICD-10-CM | POA: Diagnosis not present

## 2022-11-19 DIAGNOSIS — J42 Unspecified chronic bronchitis: Secondary | ICD-10-CM | POA: Diagnosis not present

## 2022-11-19 DIAGNOSIS — D860 Sarcoidosis of lung: Secondary | ICD-10-CM | POA: Diagnosis not present

## 2022-11-19 DIAGNOSIS — E1169 Type 2 diabetes mellitus with other specified complication: Secondary | ICD-10-CM | POA: Diagnosis not present

## 2022-11-19 DIAGNOSIS — Z794 Long term (current) use of insulin: Secondary | ICD-10-CM | POA: Diagnosis not present

## 2022-11-19 DIAGNOSIS — E785 Hyperlipidemia, unspecified: Secondary | ICD-10-CM | POA: Diagnosis not present

## 2022-11-19 DIAGNOSIS — J4550 Severe persistent asthma, uncomplicated: Secondary | ICD-10-CM | POA: Diagnosis not present

## 2022-11-19 DIAGNOSIS — E1122 Type 2 diabetes mellitus with diabetic chronic kidney disease: Secondary | ICD-10-CM

## 2022-11-19 DIAGNOSIS — I110 Hypertensive heart disease with heart failure: Secondary | ICD-10-CM | POA: Diagnosis not present

## 2022-11-19 DIAGNOSIS — N1831 Chronic kidney disease, stage 3a: Secondary | ICD-10-CM

## 2022-11-19 MED ORDER — NYSTATIN 100000 UNIT/ML MT SUSP: 5.0000 mL | Freq: Four times a day (QID) | OROMUCOSAL | 1 refills | Status: DC

## 2022-11-19 MED ORDER — ONDANSETRON 4 MG PO TBDP: 4.0000 mg | ORAL_TABLET | Freq: Three times a day (TID) | ORAL | 2 refills | Status: DC | PRN

## 2022-11-19 NOTE — Patient Instructions (Addendum)
Team can you get the Covid 19 shot logged from [place]  Blood pressure slightly high today- please monitor over next few days and update me next week- hoping better at home. For now- continue current medications   Appear to have thrush on end of tongue at least- and rest of mouth on redder side and could be beginnings of the same- treat with nystatin swish and spit 4x a day (do not swallow)  Let us know if worsening breathing issues but you seem to have this behind you  Recommended follow up: Return for next already scheduled visit or sooner if needed. On may 20th they have [place] set up for a wellness visit after the physical on the 17th- its ok to cancel that and change to a nurse wellness visit

## 2022-11-19 NOTE — Progress Notes (Unsigned)
Phone [phone]   Subjective:  [name] is a 62 y.o. year old very pleasant female patient who presents for transitional care management and hospital follow up for sarcoidosis and chronic bronchitis (never smoker) exacerbation. Patient was hospitalized from November 09, 2022 to November 12, 2022. A TCM phone call was completed on November 15, 2022. Medical complexity moderate  Patient with history of asthma, sarcoidosis, heart failure with preserved ejection fraction who was sent to the hospital by her pulmonologist for persistent cough, fevers and poor oral intake.  She was febrile, tachypneic, and wheezing at arrival and required oxygen at 2 L.  Noted to have leukocytosis of over 13,000 as well as increased interstitial markings though noted as mild and likely chronic on chest x-ray still with some concern for pneumonia.  He was admitted for asthma/COPD exacerbation but also tested positive for influenza and Tamiflu was started (antibiotics were stopped after regulation of influenza A).  Steroids were weaned as well as oxygen.  Her COVID test was negative.  Procalcitonin was not elevated. White blood cells trended down before discharge- 12.2 k on last check  Chronic conditions: 1.  Diabetes with elevated A1c 8.1 and required increases in her baseline insulin while hospitalized.  Diabetes influenced duration of prednisone usage-there is limited to 5 days given improvement in wheeze.  Basal insulin at 16 units continued at home along with metformin and NovoLog 15 units with dinner and Trulicity  2.  GERD could potentially contribute to cough so she was continued on PPI and Pepcid 3.  IBS-patient was continued on Linzess 4.  Depression -well-managed with venlafaxine and bupropion 3 mg extended release 5.  Hyperlipidemia-was continued on home statin-atorvastatin 40 mg as well as fenofibrate 160 mg 6.  Gout-continue home allopurinol 300 mg 7.  Hypertension and CHF-continued on diltiazem 120 mg  extended releaseas well as telmisartan 20 mg and furosemide 40 mg BP Readings from Last 3 Encounters:  11/19/22 (!) 140/70  11/12/22 (!) 140/63  11/04/22 132/79    I independently reviewed chest x-ray 1 view from November 09, 2022.  Airway appears normal.  No bony abnormalities other than degenerative changes of thoracic spine.  Possible cardiomegaly but only 1 view, low lung volumes noted by high diaphragm, mild diffuse increased interstitial markings without obvious consolidation  Today reports some sore throat and has some patches of irritation in her mouth- wonders if prednisone could have caused this plus was on antibiotics short term until it was figured out she had flu. Breathing has been much better- has finished prednisone (I believe as of this morning) and tamiflu thankfully. Sugars came back down off of prednisone- this morning 113. Mild nausea at home after getting home- out of ondansetron which helped in past with similar- refill requested.     See problem oriented charting as well  Past Medical History-  Patient Active Problem List   Diagnosis Date Noted   Severe persistent asthma 01/04/2019    Priority: High   Subcortical microvascular ischemic occlusive disease 07/12/2018    Priority: High   Chronic bronchitis ([place]) in never smoker 09/18/2013    Priority: High   Chest pain 04/11/2013    Priority: High   Hypertensive heart disease with chronic diastolic congestive heart failure ([place])     Priority: High   Sarcoidosis of lung ([place]) 04/10/2007    Priority: High   Type II diabetes mellitus with renal manifestations ([place]) 08/21/2006    Priority: High   Hypertriglyceridemia 04/02/2020  Priority: Medium    Aortic atherosclerosis ([place]) 02/14/2020    Priority: Medium    Constipation 04/21/2018    Priority: Medium    Epiglottitis     Priority: Medium    Osteoarthritis of right knee 01/26/2017    Priority: Medium    CKD (chronic kidney disease) stage 3, GFR 30-59 ml/min  (HCC) 04/27/2015    Priority: Medium    History of osteomyelitis 03/27/2015    Priority: Medium    Fatty liver 09/30/2014    Priority: Medium    Major depression in full remission [facility])     Priority: Medium    Gout 08/20/2010    Priority: Medium    Deficiency anemia 09/18/2009    Priority: Medium    Sleep apnea 04/21/2009    Priority: Medium    Hyperlipidemia associated with type 2 diabetes mellitus ([place]) 08/21/2006    Priority: Medium    Hypertension associated with diabetes ([place]) 08/21/2006    Priority: Medium    Nontraumatic complete tear of left rotator cuff     Priority: Low   Diabetic neuropathy ([place]) 02/20/2019    Priority: Low   Rapid palpitations 07/12/2018    Priority: Low   Impingement of left ankle joint 06/26/2018    Priority: Low   Total knee replacement status, left 12/07/2017    Priority: Low   Dysphagia 09/20/2017    Priority: Low   Plantar fasciitis, right 07/13/2017    Priority: Low   S/P total knee arthroplasty, right 06/29/2017    Priority: Low   Osteoarthrosis, localized, primary, knee, right     Priority: Low   Sprain of calcaneofibular ligament of right ankle 06/06/2017    Priority: Low   Onychomycosis 10/27/2015    Priority: Low   MRSA (methicillin resistant staph aureus) culture positive 03/27/2015    Priority: Low   Hot flashes 07/15/2014    Priority: Low   Abnormal SPEP 04/17/2014    Priority: Low   Fracture of left leg 04/17/2014    Priority: Low   Internal hemorrhoids     Priority: Low   Preoperative clearance 03/25/2014    Priority: Low   Solitary pulmonary nodule, on CT 02/2013 - stable over 2 years in 2015 02/20/2013    Priority: Low   GERD 08/21/2006    Priority: Low   Acute exacerbation of chronic obstructive pulmonary disease (COPD) ([place]) 11/09/2022   Class 2 obesity 06/06/2022   Chronic pain of left wrist 04/19/2022   Preop cardiovascular exam 12/07/2021   Facet arthropathy, cervical 06/29/2021   Foraminal stenosis of  cervical region 06/29/2021   Class II obesity 02/02/2021   Iron deficiency anemia 10/01/2020   Postural dizziness with presyncope 09/30/2020   Chronic diastolic CHF (congestive heart failure) ([place]) 08/13/2017   Pulmonary sarcoidosis ([place])    Uncontrolled type 2 diabetes mellitus with hyperglycemia ([place]) 08/21/2006    Medications- reviewed and updated  A medical reconciliation was performed comparing current medicines to hospital discharge medications. Current Outpatient Medications  Medication Sig Dispense Refill   acetaminophen (TYLENOL) 500 MG tablet Take 1,000 mg by mouth daily as needed for headache, fever, moderate pain or mild pain.     albuterol (PROVENTIL) (2.5 MG/3ML) 0.083% nebulizer solution Take 2.5 mg by nebulization every 6 (six) hours as needed for wheezing or shortness of breath.     allopurinol (ZYLOPRIM) 300 MG tablet Take 1 tablet (300 mg total) by mouth daily. 90 tablet 0   aspirin EC 81 MG tablet Take 1  tablet (81 mg total) by mouth daily. 30 tablet 11   atorvastatin (LIPITOR) 40 MG tablet TAKE 1 TABLET BY MOUTH EVERY DAY (Patient taking differently: Take 40 mg by mouth daily.) 90 tablet 3   benzonatate (TESSALON PERLES) 100 MG capsule Take 1-2 capsules (100-200 mg total) by mouth 3 (three) times daily as needed for cough. (Patient taking differently: Take 200 mg by mouth 3 (three) times daily as needed for cough.) 30 capsule 0   BREO ELLIPTA 200-25 MCG/INH AEPB TAKE 1 PUFF BY MOUTH EVERY DAY (Patient taking differently: Inhale 1 puff into the lungs daily.) 60 each 5   buPROPion (WELLBUTRIN XL) 300 MG 24 hr tablet TAKE 1 TABLET BY MOUTH EVERY DAY (Patient taking differently: Take 300 mg by mouth daily.) 90 tablet 3   chlorpheniramine-HYDROcodone (TUSSIONEX) 10-8 MG/5ML Take 5 mLs by mouth at bedtime as needed for cough (do not drive for 8 hours after taking). (Patient taking differently: Take 5 mLs by mouth at bedtime as needed for cough.) 115 mL 0   Continuous Blood Gluc  Sensor (DEXCOM G7 SENSOR) MISC 1 Device by Does not apply route as directed. Change sensor every 10 days 3 each 3   cyclobenzaprine (FLEXERIL) 5 MG tablet Take 1 tablet (5 mg total) by mouth 3 (three) times daily as needed for muscle spasms (or Headache). (Patient taking differently: Take 5 mg by mouth 3 (three) times daily as needed for muscle spasms (headache).) 30 tablet 0   diltiazem (CARDIZEM CD) 120 MG 24 hr capsule TAKE 1 CAPSULE BY MOUTH EVERY DAY (Patient taking differently: Take 120 mg by mouth daily.) 90 capsule 3   Dulaglutide (TRULICITY) 4.5 0000000 SOPN Inject 4.5 mg as directed once a week. (Patient taking differently: Inject 4.5 mg as directed every Monday.) 6 mL 3   estradiol (ESTRACE) 0.1 MG/GM vaginal cream Place 0.5 Applicatorfuls vaginally 2 (two) times a week.     fenofibrate 160 MG tablet TAKE 1 TABLET BY MOUTH EVERY DAY (Patient taking differently: Take 160 mg by mouth daily.) 90 tablet 3   fluticasone (FLONASE) 50 MCG/ACT nasal spray Place 2 sprays into both nostrils daily as needed for allergies. (Patient taking differently: Place 1 spray into both nostrils daily.) 48 mL 3   furosemide (LASIX) 40 MG tablet Take 1 tablet (40 mg total) by mouth daily.     gabapentin (NEURONTIN) 300 MG capsule Take at night in addition to the 168m gabapentin to get 4037mat night and 10053mn the morning and at mid day. (Patient taking differently: Take 300 mg by mouth at bedtime as needed (pain, neuropathy).) 90 capsule 3   glucose blood (CONTOUR NEXT TEST) test strip 1 each by Other route 4 (four) times daily. And lancets 4/day 400 each 3   insulin aspart (NOVOLOG FLEXPEN) 100 UNIT/ML FlexPen Inject 15 Units into the skin daily with supper. (Patient taking differently: Inject 8-14 Units into the skin See admin instructions. Inject 8 units before breakfast, 10 units before lunch, 14 units before supper.) 15 mL 11   insulin glargine (LANTUS SOLOSTAR) 100 UNIT/ML Solostar Pen Inject 16 Units into  the skin every morning. (Patient taking differently: Inject 22 Units into the skin daily.) 30 mL 2   Insulin Pen Needle (PEN NEEDLES) 32G X 4 MM MISC 1 each by Does not apply route 4 (four) times daily. E11.9 400 each 0   ipratropium (ATROVENT) 0.06 % nasal spray PLACE 2 SPRAYS INTO BOTH NOSTRILS 4 TIMES DAILY. (Patient taking differently: Place  1-2 sprays into both nostrils daily as needed for rhinitis.) 15 mL 3   Lancet Devices (EASY MINI EJECT LANCING DEVICE) MISC Use as directed 4 times daily to test blood sugar 1 each 3   LINZESS 290 MCG CAPS capsule TAKE 1 CAPSULE BY MOUTH DAILY BEFORE BREAKFAST. 30 capsule 3   meloxicam (MOBIC) 15 MG tablet Take 1 tablet (15 mg total) by mouth daily. 30 tablet 1   metFORMIN (GLUCOPHAGE-XR) 500 MG 24 hr tablet Take 4 tablets (2,000 mg total) by mouth daily with breakfast. (Patient taking differently: Take 1,000 mg by mouth 2 (two) times daily.) 360 tablet 3   Microlet Lancets MISC 1 each by Does not apply route 2 (two) times daily. E11.9 100 each 2   nystatin (MYCOSTATIN) 100000 UNIT/ML suspension Take 5 mLs (500,000 Units total) by mouth 4 (four) times daily. 180 mL 1   ondansetron (ZOFRAN-ODT) 4 MG disintegrating tablet Take 1 tablet (4 mg total) by mouth every 8 (eight) hours as needed for nausea or vomiting. 20 tablet 2   oseltamivir (TAMIFLU) 75 MG capsule Take 1 capsule (75 mg total) by mouth 2 (two) times daily. 7 capsule 0   pantoprazole (PROTONIX) 40 MG tablet Take 1 tablet (40 mg total) by mouth 2 (two) times daily. 180 tablet 3   potassium chloride SA (KLOR-CON M20) 20 MEQ tablet Take 1.5 tablets (30 mEq total) by mouth daily. (Patient taking differently: Take 30 mEq by mouth 2 (two) times daily.)     predniSONE (DELTASONE) 20 MG tablet Take 2 tablets (40 mg total) by mouth daily with breakfast. 13 tablet 0   telmisartan (MICARDIS) 20 MG tablet TAKE 1 TABLET BY MOUTH EVERY DAY (Patient taking differently: Take 20 mg by mouth daily.) 90 tablet 1    venlafaxine XR (EFFEXOR-XR) 75 MG 24 hr capsule TAKE 1 CAPSULE BY MOUTH TWICE A DAY (Patient taking differently: Take 75 mg by mouth 2 (two) times daily.) 180 capsule 3   No current facility-administered medications for this visit.   Objective  Objective:  BP (!) 140/70   Pulse 70   Temp 97.6 F (36.4 C)   Ht 5' 6"$  (1.676 m)   Wt 214 lb 6.4 oz (97.3 kg)   SpO2 98%   BMI 34.61 kg/m  Gen: NAD, resting comfortably On tip of tongue whitish discoloration noted and throughout rest of mucosa appears more red than usual, uvula slightly erythematous -no tonsilar enlargement CV: RRR no murmurs rubs or gallops Lungs: CTAB no crackles, wheeze, rhonchi Abdomen: soft/nontender other than intermittent epigastric mild discomfort/nondistended/normal bowel sounds. No rebound or guarding.  Ext: no edema Skin: warm, dry    Assessment and Plan:   # TCM/hospital follow-up-have encouraged follow-up with pulmonology (she declines (As well as endocrinology (to help management of diabetes).  She is making significant strides-no believe she needs outpatient physical therapy or nursing at this time.  She did need some ondansetron to help her continue to tolerate medications-mild stomach upset since leaving hospital-metformin could contribute but believe we need to continue this glucose control  Sarcoidosis of lung ([place]) Patient appeared to have significant worsening of respiratory status with recent influenza infection exacerbating her sarcoidosis/chronic bronchitis/sarcoidosis with airway component possible asthma - She was stabilized with Tamiflu and steroids and able to be weaned off of oxygen - She feels much better today - Advised her to keep close follow-up with pulmonology-she would like to hold off for now unless she has worsening symptoms  She does Appear to  have thrush on end of tongue at least (steroids and antibiotics likely contributed)- and rest of mouth on redder side and could be beginnings of  the same- treat with nystatin swish and spit 4x a day (do not swallow)  Type II diabetes mellitus with renal manifestations [facility]) Patient is management endocrinology.  A1c up to 8.1 and sugars have been running higher but we are anticipating her glucose as she comes down on prednisone to improve some-for now continue current medication and encourage close follow-up with endocrinology -Endocrinology did recently refer her to nutrition and I think that would beneficial for her  Hypertensive heart disease with chronic diastolic congestive heart failure ([place]) CHF appears stable/largely euvolemic on Lasix just once a day 40 mg- but blood pressure is mildly elevated-we opted to continue diltiazem, telmisartan, furosemide at current doses but she agrees to update me in a week-if pressures remain elevated we may need to adjust medication  Severe persistent asthma No wheezing on exam today.  Treatment with prednisone and treatment of underlying influenza appears to have brought her back to baseline-she is just finishing up prednisone 40 mg daily  CKD (chronic kidney disease) stage 3, GFR 30-59 ml/min (HCC) GFR at 56 on discharge-reasonably stable-continue to monitor  Hyperlipidemia associated with type 2 diabetes mellitus ([place]) Lab Results  Component Value Date   CHOL 137 10/12/2022   HDL 50.60 10/12/2022   LDLCALC 56 10/12/2022   LDLDIRECT 84.0 07/21/2021   TRIG 154.0 (H) 10/12/2022   CHOLHDL 3 10/12/2022  Well-controlled with LDL under 70-continue fenofibrate and atorvastatin 40 mg daily   Recommended follow up: Return for next already scheduled visit or sooner if needed. Future Appointments  Date Time Provider [place]  12/10/2022 10:30 AM Lenna Sciara, NP [facility] None  12/13/2022 11:30 AM [facility] None  12/15/2022 11:45 AM Elayne Snare, MD [facility] None  02/25/2023  9:00 AM [name], MD [facility]  02/28/2023  9:00 AM [name], MD  [facility]    Lab/Order associations:   ICD-10-CM   1. Sarcoidosis of lung ([place])  D86.0     2. Chronic bronchitis, unspecified chronic bronchitis type ([place])  J42     3. Type 2 diabetes mellitus with stage 3a chronic kidney disease, with long-term current use of insulin (HCC)  E11.22    N18.31    Z79.4     4. Hypertensive heart disease with chronic diastolic congestive heart failure (HCC)  I11.0    I50.32     5. Severe persistent asthma without complication  [id number]     6. Stage 3a chronic kidney disease (HCC)  N18.31     7. Hyperlipidemia associated with type 2 diabetes mellitus (HCC)  E11.69    E78.5       Meds ordered this encounter  Medications   ondansetron (ZOFRAN-ODT) 4 MG disintegrating tablet    Sig: Take 1 tablet (4 mg total) by mouth every 8 (eight) hours as needed for nausea or vomiting.    Dispense:  20 tablet    Refill:  2   nystatin (MYCOSTATIN) 100000 UNIT/ML suspension    Sig: Take 5 mLs (500,000 Units total) by mouth 4 (four) times daily.    Dispense:  180 mL    Refill:  1    Return precautions advised.  Garret Reddish, MD

## 2022-11-20 NOTE — Assessment & Plan Note (Signed)
CHF appears stable/largely euvolemic on Lasix just once a day 40 mg- but blood pressure is mildly elevated-we opted to continue diltiazem, telmisartan, furosemide at current doses but she agrees to update me in a week-if pressures remain elevated we may need to adjust medication

## 2022-11-20 NOTE — Assessment & Plan Note (Addendum)
Patient is management endocrinology.  A1c up to 8.1 and sugars have been running higher but we are anticipating her glucose as she comes down on prednisone to improve some-for now continue current medication and encourage close follow-up with endocrinology -Endocrinology did recently refer her to nutrition and I think that would beneficial for her

## 2022-11-20 NOTE — Assessment & Plan Note (Addendum)
No wheezing on exam today.  Treatment with prednisone and treatment of underlying influenza appears to have brought her back to baseline-she is just finishing up prednisone 40 mg daily

## 2022-11-20 NOTE — Assessment & Plan Note (Signed)
GFR at 56 on discharge-reasonably stable-continue to monitor

## 2022-11-20 NOTE — Assessment & Plan Note (Addendum)
Patient appeared to have significant worsening of respiratory status with recent influenza infection exacerbating her sarcoidosis/chronic bronchitis/sarcoidosis with airway component possible asthma - She was stabilized with Tamiflu and steroids and able to be weaned off of oxygen - She feels much better today - Advised her to keep close follow-up with pulmonology-she would like to hold off for now unless she has worsening symptoms  She does Appear to have thrush on end of tongue at least (steroids and antibiotics likely contributed)- and rest of mouth on redder side and could be beginnings of the same- treat with nystatin swish and spit 4x a day (do not swallow)

## 2022-11-20 NOTE — Assessment & Plan Note (Signed)
Lab Results  Component Value Date   CHOL 137 10/12/2022   HDL 50.60 10/12/2022   LDLCALC 56 10/12/2022   LDLDIRECT 84.0 07/21/2021   TRIG 154.0 (H) 10/12/2022   CHOLHDL 3 10/12/2022  Well-controlled with LDL under 70-continue fenofibrate and atorvastatin 40 mg daily

## 2022-11-26 ENCOUNTER — Inpatient Hospital Stay: Payer: BC Managed Care – PPO | Admitting: Family Medicine

## 2022-11-29 ENCOUNTER — Ambulatory Visit (INDEPENDENT_AMBULATORY_CARE_PROVIDER_SITE_OTHER): Payer: BC Managed Care – PPO

## 2022-11-29 ENCOUNTER — Encounter: Payer: Self-pay | Admitting: Podiatry

## 2022-11-29 ENCOUNTER — Telehealth: Payer: Self-pay

## 2022-11-29 ENCOUNTER — Other Ambulatory Visit: Payer: Self-pay | Admitting: Podiatry

## 2022-11-29 ENCOUNTER — Ambulatory Visit (INDEPENDENT_AMBULATORY_CARE_PROVIDER_SITE_OTHER): Payer: Medicare Other | Admitting: Podiatry

## 2022-11-29 DIAGNOSIS — M7671 Peroneal tendinitis, right leg: Secondary | ICD-10-CM | POA: Diagnosis not present

## 2022-11-29 DIAGNOSIS — M7750 Other enthesopathy of unspecified foot: Secondary | ICD-10-CM

## 2022-11-29 DIAGNOSIS — M7751 Other enthesopathy of right foot: Secondary | ICD-10-CM | POA: Diagnosis not present

## 2022-11-29 DIAGNOSIS — M2041 Other hammer toe(s) (acquired), right foot: Secondary | ICD-10-CM | POA: Diagnosis not present

## 2022-11-29 DIAGNOSIS — M2042 Other hammer toe(s) (acquired), left foot: Secondary | ICD-10-CM | POA: Diagnosis not present

## 2022-11-29 DIAGNOSIS — M79642 Pain in left hand: Secondary | ICD-10-CM | POA: Diagnosis not present

## 2022-11-29 MED ORDER — BETAMETHASONE SOD PHOS & ACET 6 (3-3) MG/ML IJ SUSP
3.0000 mg | Freq: Once | INTRAMUSCULAR | Status: AC
  Administered 2022-11-29: 3 mg via INTRA_ARTICULAR

## 2022-11-29 NOTE — Progress Notes (Signed)
Chief Complaint  Patient presents with   Foot Pain    Lateral (5th met base) right - tender, callused area x several months, tried Tylenol   Toe Pain    Toes bilateral - previous hammertoe surgery and noticing toes are turning under again and bothersome when walking or certain shoes   New Patient (Initial Visit)    Est pt 2021    HPI: 62 y.o. female presenting today for follow-up evaluation of symptomatic hammertoes especially to the left foot.  Patient has a history of hammertoe repair to the left foot and subsequent removal of the internal screw fixation of the toes.  She says that specifically the second and third digits of the left foot are very painful and tender on a daily basis.  She has tried different shoes over the past few years with no significant improvement or relief of symptoms.  Patient also states that she is having pain and tenderness to the lateral aspect of the right foot for the last few months.  She says it is very callused and tender.  Denies a history of injury.  She has not done anything for treatment.  Past Medical History:  Diagnosis Date   Abnormal SPEP 04/17/2014   Acute left ankle pain 01/26/2017   ANEMIA-UNSPECIFIED 09/18/2009   Anxiety    Arthritis    Blood transfusion without reported diagnosis    Cataract    CHF (congestive heart failure) (HCC)    Chronic diastolic heart failure, NYHA class 2 (HCC)    Normal LVEDP by May 2018   COPD (chronic obstructive pulmonary disease) ([place])    Depression    DIABETES MELLITUS, TYPE II 08/21/2006   Diabetic osteomyelitis ([place]) 05/29/2015   Fatty liver    Fracture of 5th metatarsal    non union   GERD [id number]   Had fundoplication   GOUT [id number]   Hammer toe of right foot    2-5th toes   Hx of umbilical hernia repair    HYPERLIPIDEMIA 08/21/2006   HYPERTENSION 08/21/2006   Infection of wound due to methicillin resistant Staphylococcus aureus (MRSA)    Internal hemorrhoids    Kidney problem    Multiple  allergies 10/14/2016   OBESITY 06/04/2009   Onychomycosis 10/27/2015   Osteomyelitis of left foot ([place]) 05/29/2015   Osteoporosis    Pulmonary sarcoidosis ([place])    Followed locally by pulmonology, but also by Dr. Casper Harrison at [facility]   Right knee pain 01/26/2017   Vocal cord dysfunction    Wears partial dentures     Past Surgical History:  Procedure Laterality Date   ABDOMINAL HYSTERECTOMY     APPENDECTOMY     BLADDER SUSPENSION  11/11/2011   Procedure: TRANSVAGINAL TAPE (TVT) PROCEDURE;  Surgeon: Olga Millers, MD;  Location: [facility];  Service: Gynecology;  Laterality: N/A;   CAROTIDS  02/18/2011   CAROTID DUPLEX; VERTEBRALS ARE PATENT WITH ANTEGRADE FLOW. ICA/CCA RATIO 1.61 ON RIGHT AND 0.75 ON LEFT   CATARACT EXTRACTION, BILATERAL     CHOLECYSTECTOMY  1984   COLONOSCOPY  04/29/2010   [name]   CYSTOSCOPY  11/11/2011   Procedure: CYSTOSCOPY;  Surgeon: Olga Millers, MD;  Location: [facility];  Service: Gynecology;  Laterality: N/A;   EXTUBATION (ENDOTRACHEAL) IN OR N/A 09/23/2017   Procedure: EXTUBATION (ENDOTRACHEAL) IN OR;  Surgeon: Helayne Seminole, MD;  Location: [facility];  Service: ENT;  Laterality: N/A;   FIBEROPTIC LARYNGOSCOPY AND TRACHEOSCOPY N/A 09/23/2017   Procedure: FLEXIBLE  FIBEROPTIC LARYNGOSCOPY;  Surgeon: Helayne Seminole, MD;  Location: [facility];  Service: ENT;  Laterality: N/A;   FRACTURE SURGERY     foot   [place] Left 10/02/2018   Procedure: HALLUX ARTHRODESIS;  Surgeon: Edrick Kins, DPM;  Location: [facility];  Service: Podiatry;  Laterality: Left;   HAMMER TOE SURGERY Left 10/02/2018   Procedure: HAMMER TOE CORRECTION 2ND 3RD 4RD FIFTH TOE;  Surgeon: Edrick Kins, DPM;  Location: [facility];  Service: Podiatry;  Laterality: Left;   HAMMER TOE SURGERY Right 04/12/2019   Procedure: HAMMER TOE CORRECTION, 2ND, 3RD, 4TH AND 5TH TOES OF RIGHT FOOT;  Surgeon: Edrick Kins, DPM;  Location: [facility];  Service: Podiatry;  Laterality: Right;   HAMMER TOE  SURGERY Left 10/25/2019   Procedure: HAMMER TOE REPAIR SECOND, THIRD, FOUTH;  Surgeon: Edrick Kins, DPM;  Location: [facility];  Service: Podiatry;  Laterality: Left;   HERNIA REPAIR     I & D EXTREMITY Left 06/27/2015   Procedure: Partial Excision Left Calcaneus, Place Antibiotic Beads, and Wound VAC;  Surgeon: Newt Minion, MD;  Location: [facility];  Service: Orthopedics;  Laterality: Left;   JOINT REPLACEMENT     KNEE ARTHROSCOPY     right   LEFT AND RIGHT HEART CATHETERIZATION WITH CORONARY ANGIOGRAM N/A 04/23/2013   Procedure: LEFT AND RIGHT HEART CATHETERIZATION WITH CORONARY ANGIOGRAM;  Surgeon: Leonie Man, MD;  Location: [facility] C[facility];  Service: Cardiovascular;  Laterality: N/A;   LEFT HEART CATH AND CORONARY ANGIOGRAPHY N/A 03/11/2017   Procedure: Left Heart Cath and Coronary Angiography;  Surgeon: Sherren Mocha, MD;  Location: [facility]; angiographically minimal CAD in the LAD otherwise normal.;  Normal LVEDP.  FALSE POSITIVE MYOVIEW   LEFT HEART CATH AND CORONARY ANGIOGRAPHY  07/20/2010   LVEF 50-55% WITH VERY MILD GLOBAL HYPOKINESIA; ESSENTIALLY NORMAL CORONARY ARTERIES; NORMAL LV FUNCTION   METATARSAL OSTEOTOMY WITH OPEN REDUCTION INTERNAL FIXATION (ORIF) METATARSAL WITH FUSION Left 04/09/2014   Procedure: LEFT FOOT FRACTURE OPEN TREATMENT METATARSAL INCLUDES INTERNAL FIXATION EACH;  Surgeon: Lorn Junes, MD;  Location: [facility];  Service: Orthopedics;  Laterality: Left;   NISSEN FUNDOPLICATION  [id number]   NM MYOVIEW LTD --> FALSE POSITIVE  03/10/2017   : Moderate size "stress-induced "perfusion defect at the apex as well as "ill-defined stress-induced perfusion defect in the lateral wall.  EF 55%.  INTERMEDIATE risk. -->  FALSE POSITIVE   ORIF DISTAL FEMUR FRACTURE  08/08/2020   [facility]: ORIF of left extra-articular periprosthetic distal femur fracture with synthesis of the femur plate with cortical nonlocking screws.  (Thought to be  pathologic fracture due to osteoporosis) -> after a fall   ORIF FEMUR W/ PERI-IMPLANT  08/29/2020   [facility]) revision of left femur fracture ORIF plate fixation screws; culture positive for MSSA   Right and left CARDIAC CATHETERIZATION  04/23/2013   Angiographic normal coronaries; LVEDP 20 mmHg, PCWP 12-14 mmHg, RAP 12 mmHg.; Fick CO/CI 4.9/2.2   ROTATOR CUFF REPAIR Right 12/09/2021   SHOULDER ARTHROSCOPY Left 03/14/2019   Procedure: LEFT SHOULDER ARTHROSCOPY, DEBRIDEMENT, AND DECOMPRESSION;  Surgeon: Newt Minion, MD;  Location: [facility];  Service: Orthopedics;  Laterality: Left;   TOTAL KNEE ARTHROPLASTY Right 06/29/2017   Procedure: RIGHT TOTAL KNEE ARTHROPLASTY;  Surgeon: Newt Minion, MD;  Location: [facility];  Service: Orthopedics;  Laterality: Right;   TOTAL KNEE ARTHROPLASTY Left 12/07/2017   TOTAL KNEE  ARTHROPLASTY Left 12/07/2017   Procedure: LEFT TOTAL KNEE ARTHROPLASTY;  Surgeon: Newt Minion, MD;  Location: [facility];  Service: Orthopedics;  Laterality: Left;   TRACHEOSTOMY TUBE PLACEMENT N/A 09/20/2017   Procedure: AWAKE INTUBATION WITH ANESTHESIA WITH VIDEO ASSISTANCE;  Surgeon: Helayne Seminole, MD;  Location: [facility];  Service: ENT;  Laterality: N/A;   TRANSTHORACIC ECHOCARDIOGRAM  08/2014   Normal LV size and function.  Mild LVH.  EF 55-60%.  Normal regional wall motion.  GR 1 DD.  Normal RV size and function .   TRANSTHORACIC ECHOCARDIOGRAM  08/12/2020    [facility]): Normal LV size and function.  EF 55 to 60%.  No RWM A.  Normal filling pattern.  Normal RV.  No significant valvular disease-stenosis or regurgitation.  No vegetation.   TUBAL LIGATION     with reversal in [place]      Allergies  Allergen Reactions   Firvanq [Vancomycin] Other (See Comments)    Dose related nephrotoxicity    Trexall [Methotrexate] Other (See Comments)    Peri-oral and buccal lesions   Zestril [Lisinopril]  Cough   Chlorhexidine Itching   Cleocin [Clindamycin] Nausea And Vomiting and Rash   Lincocin [Lincomycin Hcl] Nausea And Vomiting and Rash   Teflaro [Ceftaroline] Rash and Other (See Comments)    Tolerates ceftriaxone      Physical Exam: General: The patient is alert and oriented x3 in no acute distress.  Dermatology: Skin is warm, dry and supple bilateral lower extremities. Negative for open lesions or macerations.  Hyperkeratotic callus noted around the fifth metatarsal tubercle of the right foot.  Vascular: Palpable pedal pulses bilaterally. Capillary refill within normal limits.  Negative for any significant edema or erythema  Neurological: Light touch and protective threshold grossly intact  Musculoskeletal Exam: There is pain on palpation around the fifth metatarsal tubercle of the right foot.  Residual hammertoe deformity noted specifically to the second and third digit and contracture of the DIPJ noted with associated tenderness to palpation  Radiographic Exam B/L feet 11/29/2022:  Orthopedic screw noted to the fifth metatarsal and great toe of the left foot.  This appears stable and intact.  Hammertoe contracture at the DIPJ noted to the second and third digits of the left foot.  No acute fractures identified.  There does appear to be a chronic old stress reaction fracture of the fourth metatarsal.  Assessment: 1.  Insertional peroneal tendinitis right 2.  Symptomatic callus fifth metatarsal tubercle right 3.  Residual hammertoe deformity digits 2, 3 left   Plan of Care:  1. Patient evaluated. X-Rays reviewed.  2.  Injection of 0.5 cc Celestone Soluspan injected around the fifth metatarsal tubercle right 3.  Excisional debridement of the hyperkeratotic callus tissue was performed today using a 312 scalpel without incident or bleeding. 4.  The patient states that she has hammertoe pain to the second and third toes for the past few years now.  This affects her on a daily basis  and is affecting her daily quality of life.  She has tried different shoes with no improvement.  The patient would like to have surgery to correct for the rigid contracture of the DIPJ's.  Surgery was discussed in detail as well as the postoperative recovery course.  No guarantees were expressed or implied.  Risk benefits advantages and disadvantages were all explained.  Postoperative recovery course explained in detail.  The patient  has had hammertoe surgery before and would like to proceed to correct for the second and third toes. 5.  Authorization for surgery was initiated today.  Surgery will consist of PIPJ arthroplasty second and third digit left foot 6.  Return to clinic 1 week postop      Edrick Kins, DPM [facility]  Dr. Edrick Kins, DPM    [address], [place] [address]                Office [phone]  Fax [phone]

## 2022-11-29 NOTE — Telephone Encounter (Signed)
[name] is scheduled for surgery with Dr. Amalia Hailey on 12/30/2022. She is currently taking Trulicity. She was informed that her last dose needs to be on 12/20/2022 then she can resume after surgery. She stated she understood

## 2022-12-01 ENCOUNTER — Telehealth: Payer: Self-pay | Admitting: Urology

## 2022-12-01 NOTE — Telephone Encounter (Signed)
DOS - 12/30/22  HAMMERTOE REPAIR 2,3 LEFT --- [id number]  MEDICARE A & B  [employer]BS EFFECTIVE DATE - 10/11/22  DEDUCTIBLE - $2,500.00 W/ $0.00 REMAINING OOP - $6,500.00 W/ $0.00 REMAINING COINSURANCE - 40%  SPOKE WITH BOBBI O. WITH [employer] AND SHE STATED THAT FOR CPT CODE 20254 HAS BEEN APPROVED, AUTH # [id number], GOOD FROM 12/30/22 - 02/27/23.  REF # [id number]

## 2022-12-04 ENCOUNTER — Emergency Department (HOSPITAL_COMMUNITY): Payer: Medicare Other

## 2022-12-04 ENCOUNTER — Other Ambulatory Visit: Payer: Self-pay

## 2022-12-04 ENCOUNTER — Emergency Department (HOSPITAL_COMMUNITY)
Admission: EM | Admit: 2022-12-04 | Discharge: 2022-12-04 | Disposition: A | Discharge status: Home / Self Care | Payer: Medicare Other | Attending: Emergency Medicine | Admitting: Emergency Medicine

## 2022-12-04 ENCOUNTER — Other Ambulatory Visit: Payer: Self-pay | Admitting: Physician Assistant

## 2022-12-04 DIAGNOSIS — Z79899 Other long term (current) drug therapy: Secondary | ICD-10-CM | POA: Diagnosis not present

## 2022-12-04 DIAGNOSIS — S0181XA Laceration without foreign body of other part of head, initial encounter: Secondary | ICD-10-CM | POA: Diagnosis not present

## 2022-12-04 DIAGNOSIS — S8002XA Contusion of left knee, initial encounter: Secondary | ICD-10-CM

## 2022-12-04 DIAGNOSIS — Z96652 Presence of left artificial knee joint: Secondary | ICD-10-CM | POA: Diagnosis not present

## 2022-12-04 DIAGNOSIS — Y9301 Activity, walking, marching and hiking: Secondary | ICD-10-CM | POA: Diagnosis not present

## 2022-12-04 DIAGNOSIS — S0990XA Unspecified injury of head, initial encounter: Secondary | ICD-10-CM | POA: Diagnosis not present

## 2022-12-04 DIAGNOSIS — I10 Essential (primary) hypertension: Secondary | ICD-10-CM | POA: Diagnosis not present

## 2022-12-04 DIAGNOSIS — E119 Type 2 diabetes mellitus without complications: Secondary | ICD-10-CM | POA: Diagnosis not present

## 2022-12-04 DIAGNOSIS — M25562 Pain in left knee: Secondary | ICD-10-CM | POA: Diagnosis not present

## 2022-12-04 DIAGNOSIS — Z7984 Long term (current) use of oral hypoglycemic drugs: Secondary | ICD-10-CM | POA: Diagnosis not present

## 2022-12-04 DIAGNOSIS — R55 Syncope and collapse: Secondary | ICD-10-CM | POA: Diagnosis not present

## 2022-12-04 DIAGNOSIS — S80919A Unspecified superficial injury of unspecified knee, initial encounter: Secondary | ICD-10-CM | POA: Diagnosis not present

## 2022-12-04 DIAGNOSIS — S01112A Laceration without foreign body of left eyelid and periocular area, initial encounter: Secondary | ICD-10-CM

## 2022-12-04 DIAGNOSIS — W19XXXA Unspecified fall, initial encounter: Secondary | ICD-10-CM

## 2022-12-04 DIAGNOSIS — Y92511 Restaurant or cafe as the place of occurrence of the external cause: Secondary | ICD-10-CM | POA: Insufficient documentation

## 2022-12-04 DIAGNOSIS — W01198A Fall on same level from slipping, tripping and stumbling with subsequent striking against other object, initial encounter: Secondary | ICD-10-CM | POA: Insufficient documentation

## 2022-12-04 DIAGNOSIS — R220 Localized swelling, mass and lump, head: Secondary | ICD-10-CM | POA: Diagnosis not present

## 2022-12-04 DIAGNOSIS — Z7982 Long term (current) use of aspirin: Secondary | ICD-10-CM | POA: Diagnosis not present

## 2022-12-04 DIAGNOSIS — Z794 Long term (current) use of insulin: Secondary | ICD-10-CM | POA: Insufficient documentation

## 2022-12-04 DIAGNOSIS — S0592XA Unspecified injury of left eye and orbit, initial encounter: Secondary | ICD-10-CM | POA: Diagnosis not present

## 2022-12-04 MED ORDER — ACETAMINOPHEN 325 MG PO TABS
650.0000 mg | ORAL_TABLET | Freq: Once | ORAL | Status: AC
  Administered 2022-12-04: 650 mg via ORAL
  Filled 2022-12-04: qty 2

## 2022-12-04 MED ORDER — IBUPROFEN 200 MG PO TABS
400.0000 mg | ORAL_TABLET | Freq: Once | ORAL | Status: AC
  Administered 2022-12-04: 400 mg via ORAL
  Filled 2022-12-04: qty 2

## 2022-12-04 MED ORDER — LIDOCAINE-EPINEPHRINE (PF) 2 %-1:200000 IJ SOLN
10.0000 mL | Freq: Once | INTRAMUSCULAR | Status: AC
  Administered 2022-12-04: 10 mL
  Filled 2022-12-04: qty 20

## 2022-12-04 NOTE — Discharge Instructions (Addendum)
You were seen in the emergency department after your fall.  Your workup showed no broken bones and no bleeding in your brain.  You likely bruised your knee and you can continue to ice it and keep it elevated and take Tylenol and Motrin for pain.  You should also ice your eye.  You did have a cut on your eye that was repaired with 2 stitches and a small cut on your eyebrow repaired with skin glue.  You should try to keep this dry for the next 12 to 24 hours and then you can shower normally, just do not soak your face or swim while the stitches are in place.  These can be removed by your primary doctor, urgent care or in the ER in 3 to 5 days.  You should return to the emergency department for severe headaches, repetitive vomiting, decreased vision, or if you have any other new or concerning symptoms.

## 2022-12-04 NOTE — ED Provider Notes (Signed)
[facility] AT [facility] Provider Note   CSN: [id number] Arrival date & time: 12/04/22  1928     History  Chief Complaint  Patient presents with   Lytle Michaels    [name] is a 62 y.o. female.  Patient is a 62 year old female with a past medical history of hypertension, diabetes, prior left knee replacement presenting to the emergency department after a fall.  She states that she was walking out of a restaurant this evening when she tripped, fell landing on her left knee and hitting her face.  She states that she thinks that she had a brief episode of loss of consciousness.  She states that she has a mild headache and had some nausea earlier but denies any vomiting.  She denies any numbness or weakness.  She states that she was unable to stand up and ambulate due to the pain in her left knee.  Per her records, her tetanus was last updated in 2021.  The history is provided by the patient and medical records.  Fall  Patient is a 62 year old female with a past medical history of hypertension, diabetes, prior     Home Medications Prior to Admission medications   Medication Sig Start Date End Date Taking? Authorizing Provider  acetaminophen (TYLENOL) 500 MG tablet Take 1,000 mg by mouth daily as needed for headache, fever, moderate pain or mild pain.    [provider]  albuterol (PROVENTIL) (2.5 MG/3ML) 0.083% nebulizer solution Take 2.5 mg by nebulization every 6 (six) hours as needed for wheezing or shortness of breath.    [provider]  allopurinol (ZYLOPRIM) 300 MG tablet Take 1 tablet (300 mg total) by mouth daily. 09/08/22   Aundra Dubin, PA-C  aspirin EC 81 MG tablet Take 1 tablet (81 mg total) by mouth daily. 11/03/20   Leonie Man, MD  atorvastatin (LIPITOR) 40 MG tablet TAKE 1 TABLET BY MOUTH EVERY DAY Patient taking differently: Take 40 mg by mouth daily. 09/09/22   [name], MD  benzonatate (TESSALON  PERLES) 100 MG capsule Take 1-2 capsules (100-200 mg total) by mouth 3 (three) times daily as needed for cough. Patient taking differently: Take 200 mg by mouth 3 (three) times daily as needed for cough. 11/04/22   Hudnell, Colletta [place], NP  BREO ELLIPTA 200-25 MCG/INH AEPB TAKE 1 PUFF BY MOUTH EVERY DAY Patient taking differently: Inhale 1 puff into the lungs daily. 06/04/19   [name], MD  buPROPion (WELLBUTRIN XL) 300 MG 24 hr tablet TAKE 1 TABLET BY MOUTH EVERY DAY Patient taking differently: Take 300 mg by mouth daily. 01/04/22   [name], MD  chlorpheniramine-HYDROcodone (TUSSIONEX) 10-8 MG/5ML Take 5 mLs by mouth at bedtime as needed for cough (do not drive for 8 hours after taking). Patient taking differently: Take 5 mLs by mouth at bedtime as needed for cough. 11/04/22   Jeanie Sewer, NP  Continuous Blood Gluc Sensor (DEXCOM G7 SENSOR) MISC 1 Device by Does not apply route as directed. Change sensor every 10 days 10/14/22   Elayne Snare, MD  cyclobenzaprine (FLEXERIL) 5 MG tablet Take 1 tablet (5 mg total) by mouth 3 (three) times daily as needed for muscle spasms (or Headache). Patient taking differently: Take 5 mg by mouth 3 (three) times daily as needed for muscle spasms (headache). 11/04/22   Jeanie Sewer, NP  diltiazem (CARDIZEM CD) 120 MG 24 hr capsule TAKE 1 CAPSULE BY MOUTH EVERY DAY Patient taking  differently: Take 120 mg by mouth daily. 01/28/22   Leonie Man, MD  Dulaglutide (TRULICITY) 4.5 0000000 SOPN Inject 4.5 mg as directed once a week. Patient taking differently: Inject 4.5 mg as directed every Monday. 02/02/22   Renato Shin, MD  estradiol (ESTRACE) 0.1 MG/GM vaginal cream Place 0.5 Applicatorfuls vaginally 2 (two) times a week.    [provider]  fenofibrate 160 MG tablet TAKE 1 TABLET BY MOUTH EVERY DAY Patient taking differently: Take 160 mg by mouth daily. 01/04/22   Leonie Man, MD  fluticasone [facility]) 50 MCG/ACT nasal spray  Place 2 sprays into both nostrils daily as needed for allergies. Patient taking differently: Place 1 spray into both nostrils daily. 01/22/20   [name], MD  furosemide (LASIX) 40 MG tablet Take 1 tablet (40 mg total) by mouth daily. 06/12/22   Cherene Altes, MD  gabapentin (NEURONTIN) 300 MG capsule Take at night in addition to the '100mg'$  gabapentin to get '400mg'$  at night and '100mg'$  in the morning and at mid day. Patient taking differently: Take 300 mg by mouth at bedtime as needed (pain, neuropathy). 03/29/22   Gregor Hams, MD  glucose blood (CONTOUR NEXT TEST) test strip 1 each by Other route 4 (four) times daily. And lancets 4/day 09/19/19   Renato Shin, MD  insulin aspart (NOVOLOG FLEXPEN) 100 UNIT/ML FlexPen Inject 15 Units into the skin daily with supper. Patient taking differently: Inject 8-14 Units into the skin See admin instructions. Inject 8 units before breakfast, 10 units before lunch, 14 units before supper. 11/06/21   Renato Shin, MD  insulin glargine (LANTUS SOLOSTAR) 100 UNIT/ML Solostar Pen Inject 16 Units into the skin every morning. Patient taking differently: Inject 22 Units into the skin daily. 07/30/21   Renato Shin, MD  Insulin Pen Needle (PEN NEEDLES) 32G X 4 MM MISC 1 each by Does not apply route 4 (four) times daily. E11.9 02/22/20   Renato Shin, MD  ipratropium (ATROVENT) 0.06 % nasal spray PLACE 2 SPRAYS INTO BOTH NOSTRILS 4 TIMES DAILY. Patient taking differently: Place 1-2 sprays into both nostrils daily as needed for rhinitis. 09/30/22   [name], MD  Lancet Devices (EASY MINI EJECT LANCING DEVICE) MISC Use as directed 4 times daily to test blood sugar 11/04/20   Renato Shin, MD  LINZESS 290 MCG CAPS capsule TAKE 1 CAPSULE BY MOUTH DAILY BEFORE BREAKFAST. 09/07/21   Irene Shipper, MD  meloxicam (MOBIC) 15 MG tablet Take 1 tablet (15 mg total) by mouth daily. 06/24/21   Marybelle Killings, MD  metFORMIN (GLUCOPHAGE-XR) 500 MG 24 hr tablet Take 4  tablets (2,000 mg total) by mouth daily with breakfast. Patient taking differently: Take 1,000 mg by mouth 2 (two) times daily. 05/05/22   Elayne Snare, MD  Microlet Lancets MISC 1 each by Does not apply route 2 (two) times daily. E11.9 09/19/19   Renato Shin, MD  nystatin (MYCOSTATIN) 100000 UNIT/ML suspension Take 5 mLs (500,000 Units total) by mouth 4 (four) times daily. 11/19/22   [name], MD  ondansetron (ZOFRAN-ODT) 4 MG disintegrating tablet Take 1 tablet (4 mg total) by mouth every 8 (eight) hours as needed for nausea or vomiting. 11/19/22   [name], MD  oseltamivir (TAMIFLU) 75 MG capsule Take 1 capsule (75 mg total) by mouth 2 (two) times daily. 11/12/22   Patrecia Pour, MD  pantoprazole (PROTONIX) 40 MG tablet Take 1 tablet (40 mg total) by mouth 2 (  two) times daily. 09/07/22   Irene Shipper, MD  potassium chloride SA (KLOR-CON M20) 20 MEQ tablet Take 1.5 tablets (30 mEq total) by mouth daily. Patient taking differently: Take 30 mEq by mouth 2 (two) times daily. 06/12/22   Cherene Altes, MD  predniSONE (DELTASONE) 20 MG tablet Take 2 tablets (40 mg total) by mouth daily with breakfast. 11/13/22   Patrecia Pour, MD  telmisartan (MICARDIS) 20 MG tablet TAKE 1 TABLET BY MOUTH EVERY DAY Patient taking differently: Take 20 mg by mouth daily. 07/12/22   [name], MD  venlafaxine XR (EFFEXOR-XR) 75 MG 24 hr capsule TAKE 1 CAPSULE BY MOUTH TWICE A DAY Patient taking differently: Take 75 mg by mouth 2 (two) times daily. 01/28/22   [name], MD  mupirocin nasal ointment (BACTROBAN) 2 % Place 1 application into the nose 2 (two) times daily. Use one-half of tube in each nostril twice daily for five (5) days. After application, press sides of nose together and gently massage. 03/28/18 03/28/18  [name], MD      Allergies    Firvanq [vancomycin], Trexall [methotrexate], Zestril [lisinopril], Chlorhexidine, Cleocin [clindamycin], Lincocin [lincomycin hcl], and  Teflaro [ceftaroline]    Review of Systems   Review of Systems  Physical Exam Updated Vital Signs BP (!) 150/65 (BP Location: Left Arm)   Pulse (!) 102   Temp 98.7 F (37.1 C) (Oral)   Resp 18   SpO2 95%  Physical Exam Vitals and nursing note reviewed.  Constitutional:      General: She is not in acute distress.    Appearance: Normal appearance. She is obese.  HENT:     Head: Normocephalic and atraumatic.     Nose: Nose normal.     Mouth/Throat:     Mouth: Mucous membranes are moist.     Pharynx: Oropharynx is clear.  Eyes:     Extraocular Movements: Extraocular movements intact.     Conjunctiva/sclera: Conjunctivae normal.     Pupils: Pupils are equal, round, and reactive to light.     Comments: Ecchymosis around left eye with tenderness to palpation along left eyebrow Approximately 1.5 cm gaping, nonbleeding laceration to left eyebrow  Neck:     Comments: No midline neck tenderness Cardiovascular:     Rate and Rhythm: Normal rate and regular rhythm.     Heart sounds: Normal heart sounds.  Pulmonary:     Effort: Pulmonary effort is normal.     Breath sounds: Normal breath sounds.  Abdominal:     General: Abdomen is flat.     Palpations: Abdomen is soft.     Tenderness: There is no abdominal tenderness.  Musculoskeletal:     Cervical back: Normal range of motion and neck supple.     Comments: No midline back tenderness Pelvis stable, nontender No bony tenderness to palpation of bilateral upper extremities or right lower extremity Tenderness to palpation of left anterior and medial knee with overlying abrasion, knee flexion intact though increased pain with knee flexion, no bony tenderness to left hip or ankle, no obvious deformities  Skin:    General: Skin is warm and dry.  Neurological:     General: No focal deficit present.     Mental Status: She is alert and oriented to person, place, and time.     Sensory: No sensory deficit.     Motor: No weakness.   Psychiatric:        Mood and Affect: Mood normal.  ED Results / Procedures / Treatments   Labs (all labs ordered are listed, but only abnormal results are displayed) Labs Reviewed - No data to display  EKG None  Radiology CT Head Wo Contrast  Result Date: 12/04/2022 CLINICAL DATA:  Head trauma, moderate-severe; Facial trauma, blunt. Fall, facial laceration EXAM: CT HEAD WITHOUT CONTRAST CT MAXILLOFACIAL WITHOUT CONTRAST TECHNIQUE: Multidetector CT imaging of the head and maxillofacial structures were performed using the standard protocol without intravenous contrast. Multiplanar CT image reconstructions of the maxillofacial structures were also generated. RADIATION DOSE REDUCTION: This exam was performed according to the departmental dose-optimization program which includes automated exposure control, adjustment of the mA and/or kV according to patient size and/or use of iterative reconstruction technique. COMPARISON:  CT head 07/27/2018 FINDINGS: CT HEAD FINDINGS Brain: No evidence of acute infarction, hemorrhage, hydrocephalus, extra-axial collection or mass lesion/mass effect. Vascular: No hyperdense vessel or unexpected calcification. Skull: Normal. Negative for fracture or focal lesion. Other: Mastoid air cells and middle ear cavities are clear. Soft tissue swelling and punctate subcutaneous gas noted superficial to the left supraorbital ridge. CT MAXILLOFACIAL FINDINGS Osseous: No fracture or mandibular dislocation. No destructive process. Orbits: Moderate left preseptal soft tissue swelling. The ocular globes are intact. Ocular lenses have been removed. No retro-orbital fluid collection or inflammatory change identified. Extraocular musculature and optic nerves are unremarkable. Sinuses: Clear. Soft tissues: Negative. IMPRESSION: CT HEAD: 1. No acute intracranial abnormality. No calvarial fracture. Mild soft tissue swelling superficial to the left supraorbital ridge. CT MAXILLOFACIAL: 1.  No acute facial bone fracture. 2. Moderate left preseptal soft tissue swelling. Electronically Signed   By: Fidela Salisbury M.D.   On: 12/04/2022 20:50   CT Maxillofacial WO CM  Result Date: 12/04/2022 CLINICAL DATA:  Head trauma, moderate-severe; Facial trauma, blunt. Fall, facial laceration EXAM: CT HEAD WITHOUT CONTRAST CT MAXILLOFACIAL WITHOUT CONTRAST TECHNIQUE: Multidetector CT imaging of the head and maxillofacial structures were performed using the standard protocol without intravenous contrast. Multiplanar CT image reconstructions of the maxillofacial structures were also generated. RADIATION DOSE REDUCTION: This exam was performed according to the departmental dose-optimization program which includes automated exposure control, adjustment of the mA and/or kV according to patient size and/or use of iterative reconstruction technique. COMPARISON:  CT head 07/27/2018 FINDINGS: CT HEAD FINDINGS Brain: No evidence of acute infarction, hemorrhage, hydrocephalus, extra-axial collection or mass lesion/mass effect. Vascular: No hyperdense vessel or unexpected calcification. Skull: Normal. Negative for fracture or focal lesion. Other: Mastoid air cells and middle ear cavities are clear. Soft tissue swelling and punctate subcutaneous gas noted superficial to the left supraorbital ridge. CT MAXILLOFACIAL FINDINGS Osseous: No fracture or mandibular dislocation. No destructive process. Orbits: Moderate left preseptal soft tissue swelling. The ocular globes are intact. Ocular lenses have been removed. No retro-orbital fluid collection or inflammatory change identified. Extraocular musculature and optic nerves are unremarkable. Sinuses: Clear. Soft tissues: Negative. IMPRESSION: CT HEAD: 1. No acute intracranial abnormality. No calvarial fracture. Mild soft tissue swelling superficial to the left supraorbital ridge. CT MAXILLOFACIAL: 1. No acute facial bone fracture. 2. Moderate left preseptal soft tissue swelling.  Electronically Signed   By: Fidela Salisbury M.D.   On: 12/04/2022 20:50   DG Knee Complete 4 Views Left  Result Date: 12/04/2022 CLINICAL DATA:  Knee pain after fall EXAM: LEFT KNEE - COMPLETE 4+ VIEW COMPARISON:  Radiographs 01/14/2022 FINDINGS: Fixation plates and screws of the distal femur are redemonstrated. Left TKA. No radiographic evidence of loosening. Old fracture deformity of the distal femoral metadiaphysis.  No evidence of acute fracture. No dislocation. No definite joint effusion. IMPRESSION: No significant change from 01/14/2022. No evidence of acute fracture. Electronically Signed   By: Placido Sou M.D.   On: 12/04/2022 20:33    Procedures .Marland KitchenLaceration Repair  Date/Time: 12/04/2022 9:39 PM  Performed by: Kemper Durie, DO Authorized by: Kemper Durie, DO   Consent:    Consent obtained:  Verbal   Consent given by:  Patient   Risks discussed:  Infection, need for additional repair, nerve damage, pain, poor cosmetic result, poor wound healing and retained foreign body   Alternatives discussed:  No treatment Universal protocol:    Procedure explained and questions answered to patient or proxy's satisfaction: yes     Patient identity confirmed:  Verbally with patient Anesthesia:    Anesthesia method:  Local infiltration   Local anesthetic:  Lidocaine 1% WITH epi Laceration details:    Location:  Face   Face location:  L eyebrow   Length (cm):  1.5   Depth (mm):  3 Exploration:    Limited defect created (wound extended): yes     Hemostasis achieved with:  Direct pressure   Imaging obtained comment:  CT max/face   Imaging outcome: foreign body not noted     Wound exploration: wound explored through full range of motion and entire depth of wound visualized     Wound extent: areolar tissue not violated, fascia not violated, no foreign body, no signs of injury, no nerve damage, no tendon damage, no underlying fracture and no vascular damage     Contaminated: no    Treatment:    Area cleansed with:  Shur-Clens   Amount of cleaning:  Standard   Irrigation method:  Pressure wash   Visualized foreign bodies/material removed: no     Debridement:  None   Undermining:  None   Scar revision: no   Skin repair:    Repair method:  Sutures   Suture size:  6-0   Suture material:  Prolene   Suture technique:  Simple interrupted   Number of sutures:  2 Approximation:    Approximation:  Close Repair type:    Repair type:  Simple Post-procedure details:    Dressing:  Adhesive bandage   Procedure completion:  Tolerated well, no immediate complications     Medications Ordered in ED Medications  ibuprofen (ADVIL) tablet 400 mg (has no administration in time range)  acetaminophen (TYLENOL) tablet 650 mg (650 mg Oral Given 12/04/22 2043)  lidocaine-EPINEPHrine (XYLOCAINE W/EPI) 2 %-1:200000 (PF) injection 10 mL (10 mLs Infiltration Given by Other 12/04/22 2128)    ED Course/ Medical Decision Making/ A&P                             Medical Decision Making This patient presents to the ED with chief complaint(s) of fall with pertinent past medical history of HTN, DM, prior L knee replacement which further complicates the presenting complaint. The complaint involves an extensive differential diagnosis and also carries with it a high risk of complications and morbidity.    The differential diagnosis includes fracture, dislocation, disruption of hardware of left knee, ICH, mass effect, facial fracture, patient's vision is intact without any eye pain or proptosis making retrobulbar hematoma unlikely, patient does have evidence of a laceration on her eyebrow as well as abrasion to the left knee, no other traumatic injuries seen on exam, no presyncopal symptoms making syncopal fall unlikely  Additional history obtained: Additional history obtained from N/A Records reviewed previous admission documents  ED Course and Reassessment: On patient's arrival to the  emergency department she is awake alert and well-appearing in no acute distress.  She does have a laceration to her left eyebrow with underlying bony tenderness concerning for facial fracture and will have CT head and max face performed.  She will require laceration repair.  Her tetanus was updated in 2021 so she does not require update today.  She additionally has tenderness to palpation of her left knee and will have x-ray performed to evaluate for fracture or dislocation.  She was given Tylenol and ice for pain control and will be closely reassessed.  Independent labs interpretation:  N/A  Independent visualization of imaging: - I independently visualized the following imaging with scope of interpretation limited to determining acute life threatening conditions related to emergency care: CTH/Max face, L knee XR, which revealed no acute traumatic injury  Consultation: - Consulted or discussed management/test interpretation w/ external professional: N/A  Consideration for admission or further workup: Patient has no emergent conditions requiring admission or further work-up at this time and is stable for discharge home with primary care follow-up  Social Determinants of health: N/A    Amount and/or Complexity of Data Reviewed Radiology: ordered.  Risk OTC drugs. Prescription drug management.          Final Clinical Impression(s) / ED Diagnoses Final diagnoses:  Fall, initial encounter  Laceration of left eyebrow, initial encounter  Contusion of left knee, initial encounter    Rx / DC Orders ED Discharge Orders     None         Kemper Durie, DO 12/04/22 2142

## 2022-12-04 NOTE — ED Triage Notes (Signed)
Pt BIB [facility] from restaurant. Pt had a fall hit head causing laceration over left eye. No LOC. Pt c/o left knee pain. Hx left femur fracture and left knee replacement. Vitals stable 130/60 100HR 20RR 98%ra  [id number] hx diabetes.

## 2022-12-07 ENCOUNTER — Telehealth: Payer: Self-pay

## 2022-12-07 NOTE — Telephone Encounter (Signed)
Transition Care Management Follow-up Telephone Call  Date discharged? 12/04/22  How have you been since you were released from the hospital? Some improvement, but still experiencing knee pain and tenderness around her eye laceration.   Do you understand why you were in the hospital? Yes, fell walking out of a restaurant landing on her left knee and hitting her face which resulted in a laceration to her left eyebrow.  Do you understand the discharge instructions? yes  Where were you discharged to? Home   Items Reviewed: Medications reviewed: yes Allergies reviewed: yes Dietary changes reviewed: yes Referrals reviewed: yes   Functional Questionnaire:  Activities of Daily Living (ADLs):   She states they are independent in the following: ambulation, bathing and hygiene, feeding, continence, grooming, toileting, and dressing  States they require assistance with the following: n/a  Any transportation issues/concerns?: no  Any patient concerns? no  Confirmed importance and date/time of follow-up visits scheduled yes Provider Appointment booked with Garret Reddish, MD 12/10/22 @ 4 pm   Confirmed with patient if condition begins to worsen call PCP or go to the ER.  Patient was given the office number and encouraged to call back with question or concerns.  : yes

## 2022-12-07 NOTE — Telephone Encounter (Signed)
Noted thanks °

## 2022-12-08 ENCOUNTER — Ambulatory Visit: Payer: BC Managed Care – PPO | Admitting: Physician Assistant

## 2022-12-10 ENCOUNTER — Ambulatory Visit: Payer: Medicare Other | Admitting: Family Medicine

## 2022-12-10 ENCOUNTER — Ambulatory Visit: Payer: BC Managed Care – PPO | Admitting: Nurse Practitioner

## 2022-12-10 ENCOUNTER — Encounter: Payer: Self-pay | Admitting: Family Medicine

## 2022-12-10 VITALS — BP 150/68 | HR 98 | Temp 97.7°F | Ht 66.0 in | Wt 210.8 lb

## 2022-12-10 DIAGNOSIS — W19XXXA Unspecified fall, initial encounter: Secondary | ICD-10-CM | POA: Diagnosis not present

## 2022-12-10 DIAGNOSIS — S0191XA Laceration without foreign body of unspecified part of head, initial encounter: Secondary | ICD-10-CM

## 2022-12-10 DIAGNOSIS — I10 Essential (primary) hypertension: Secondary | ICD-10-CM

## 2022-12-10 MED ORDER — TELMISARTAN 40 MG PO TABS: 40.0000 mg | ORAL_TABLET | Freq: Every day | ORAL | 3 refills | Status: DC

## 2022-12-10 NOTE — Patient Instructions (Addendum)
Increase telmisartan to '40mg'$  - continue other blood pressure medicines  Recommended follow up: Return in about 1 month (around 01/10/2023) for followup or sooner if needed.Schedule b4 you leave. And recheck potassium

## 2022-12-10 NOTE — Progress Notes (Signed)
Phone [phone] In person visit   Subjective:   [name] is a 62 y.o. year old very pleasant female patient who presents for/with See problem oriented charting Chief Complaint  Patient presents with   Follow-up    [name] here for ER f/u due to fall and needs suture removed. Pt was leaving a restaurant last Saturday and not sure if she blacked out, she does not remember the fall but remembers event before the fall.    Past Medical History-  Patient Active Problem List   Diagnosis Date Noted   Severe persistent asthma 01/04/2019    Priority: High   Subcortical microvascular ischemic occlusive disease 07/12/2018    Priority: High   Chronic bronchitis ([place]) in never smoker 09/18/2013    Priority: High   Chest pain 04/11/2013    Priority: High   Hypertensive heart disease with chronic diastolic congestive heart failure ([place])     Priority: High   Sarcoidosis of lung ([place]) 04/10/2007    Priority: High   Type II diabetes mellitus with renal manifestations ([place]) 08/21/2006    Priority: High   Hypertriglyceridemia 04/02/2020    Priority: Medium    Aortic atherosclerosis ([place]) 02/14/2020    Priority: Medium    Constipation 04/21/2018    Priority: Medium    Epiglottitis     Priority: Medium    Osteoarthritis of right knee 01/26/2017    Priority: Medium    CKD (chronic kidney disease) stage 3, GFR 30-59 ml/min ([place]) 04/27/2015    Priority: Medium    History of osteomyelitis 03/27/2015    Priority: Medium    Fatty liver 09/30/2014    Priority: Medium    Major depression in full remission ([place])     Priority: Medium    Gout 08/20/2010    Priority: Medium    Deficiency anemia 09/18/2009    Priority: Medium    Sleep apnea 04/21/2009    Priority: Medium    Hyperlipidemia associated with type 2 diabetes mellitus ([place]) 08/21/2006    Priority: Medium    Hypertension associated with diabetes ([place]) 08/21/2006    Priority: Medium    Nontraumatic complete tear of left  rotator cuff     Priority: Low   Diabetic neuropathy ([place]) 02/20/2019    Priority: Low   Rapid palpitations 07/12/2018    Priority: Low   Impingement of left ankle joint 06/26/2018    Priority: Low   Total knee replacement status, left 12/07/2017    Priority: Low   Dysphagia 09/20/2017    Priority: Low   Plantar fasciitis, right 07/13/2017    Priority: Low   S/P total knee arthroplasty, right 06/29/2017    Priority: Low   Osteoarthrosis, localized, primary, knee, right     Priority: Low   Sprain of calcaneofibular ligament of right ankle 06/06/2017    Priority: Low   Onychomycosis 10/27/2015    Priority: Low   MRSA (methicillin resistant staph aureus) culture positive 03/27/2015    Priority: Low   Hot flashes 07/15/2014    Priority: Low   Abnormal SPEP 04/17/2014    Priority: Low   Fracture of left leg 04/17/2014    Priority: Low   Internal hemorrhoids     Priority: Low   Preoperative clearance 03/25/2014    Priority: Low   Solitary pulmonary nodule, on CT 02/2013 - stable over 2 years in 2015 02/20/2013    Priority: Low   GERD 08/21/2006    Priority: Low   Acute exacerbation  of chronic obstructive pulmonary disease (COPD) ([place]) 11/09/2022   Class 2 obesity 06/06/2022   Chronic pain of left wrist 04/19/2022   Preop cardiovascular exam 12/07/2021   Facet arthropathy, cervical 06/29/2021   Foraminal stenosis of cervical region 06/29/2021   Class II obesity 02/02/2021   Iron deficiency anemia 10/01/2020   Postural dizziness with presyncope 09/30/2020   Chronic diastolic CHF (congestive heart failure) ([place]) 08/13/2017   Pulmonary sarcoidosis ([place])    Uncontrolled type 2 diabetes mellitus with hyperglycemia ([place]) 08/21/2006    Medications- reviewed and updated Current Outpatient Medications  Medication Sig Dispense Refill   acetaminophen (TYLENOL) 500 MG tablet Take 1,000 mg by mouth daily as needed for headache, fever, moderate pain or mild pain.     albuterol  (PROVENTIL) (2.5 MG/3ML) 0.083% nebulizer solution Take 2.5 mg by nebulization every 6 (six) hours as needed for wheezing or shortness of breath.     allopurinol (ZYLOPRIM) 300 MG tablet Take 1 tablet (300 mg total) by mouth daily. 90 tablet 0   aspirin EC 81 MG tablet Take 1 tablet (81 mg total) by mouth daily. 30 tablet 11   atorvastatin (LIPITOR) 40 MG tablet TAKE 1 TABLET BY MOUTH EVERY DAY (Patient taking differently: Take 40 mg by mouth daily.) 90 tablet 3   BREO ELLIPTA 200-25 MCG/INH AEPB TAKE 1 PUFF BY MOUTH EVERY DAY (Patient taking differently: Inhale 1 puff into the lungs daily.) 60 each 5   buPROPion (WELLBUTRIN XL) 300 MG 24 hr tablet TAKE 1 TABLET BY MOUTH EVERY DAY (Patient taking differently: Take 300 mg by mouth daily.) 90 tablet 3   Continuous Blood Gluc Sensor (DEXCOM G7 SENSOR) MISC 1 Device by Does not apply route as directed. Change sensor every 10 days 3 each 3   cyclobenzaprine (FLEXERIL) 5 MG tablet Take 1 tablet (5 mg total) by mouth 3 (three) times daily as needed for muscle spasms (or Headache). (Patient taking differently: Take 5 mg by mouth 3 (three) times daily as needed for muscle spasms (headache).) 30 tablet 0   diltiazem (CARDIZEM CD) 120 MG 24 hr capsule TAKE 1 CAPSULE BY MOUTH EVERY DAY (Patient taking differently: Take 120 mg by mouth daily.) 90 capsule 3   Dulaglutide (TRULICITY) 4.5 0000000 SOPN Inject 4.5 mg as directed once a week. (Patient taking differently: Inject 4.5 mg as directed every Monday.) 6 mL 3   estradiol (ESTRACE) 0.1 MG/GM vaginal cream Place 0.5 Applicatorfuls vaginally 2 (two) times a week.     fenofibrate 160 MG tablet TAKE 1 TABLET BY MOUTH EVERY DAY (Patient taking differently: Take 160 mg by mouth daily.) 90 tablet 3   fluticasone (FLONASE) 50 MCG/ACT nasal spray Place 2 sprays into both nostrils daily as needed for allergies. (Patient taking differently: Place 1 spray into both nostrils daily.) 48 mL 3   furosemide (LASIX) 40 MG tablet  Take 1 tablet (40 mg total) by mouth daily.     gabapentin (NEURONTIN) 300 MG capsule Take at night in addition to the '100mg'$  gabapentin to get '400mg'$  at night and '100mg'$  in the morning and at mid day. (Patient taking differently: Take 300 mg by mouth at bedtime as needed (pain, neuropathy).) 90 capsule 3   glucose blood (CONTOUR NEXT TEST) test strip 1 each by Other route 4 (four) times daily. And lancets 4/day 400 each 3   insulin aspart (NOVOLOG FLEXPEN) 100 UNIT/ML FlexPen Inject 15 Units into the skin daily with supper. (Patient taking differently: Inject 8-14 Units into the  skin See admin instructions. Inject 8 units before breakfast, 10 units before lunch, 14 units before supper.) 15 mL 11   insulin glargine (LANTUS SOLOSTAR) 100 UNIT/ML Solostar Pen Inject 16 Units into the skin every morning. (Patient taking differently: Inject 22 Units into the skin daily.) 30 mL 2   Insulin Pen Needle (PEN NEEDLES) 32G X 4 MM MISC 1 each by Does not apply route 4 (four) times daily. E11.9 400 each 0   ipratropium (ATROVENT) 0.06 % nasal spray PLACE 2 SPRAYS INTO BOTH NOSTRILS 4 TIMES DAILY. (Patient taking differently: Place 1-2 sprays into both nostrils daily as needed for rhinitis.) 15 mL 3   Lancet Devices (EASY MINI EJECT LANCING DEVICE) MISC Use as directed 4 times daily to test blood sugar 1 each 3   LINZESS 290 MCG CAPS capsule TAKE 1 CAPSULE BY MOUTH DAILY BEFORE BREAKFAST. 30 capsule 3   meloxicam (MOBIC) 15 MG tablet Take 1 tablet (15 mg total) by mouth daily. 30 tablet 1   metFORMIN (GLUCOPHAGE-XR) 500 MG 24 hr tablet Take 4 tablets (2,000 mg total) by mouth daily with breakfast. (Patient taking differently: Take 1,000 mg by mouth 2 (two) times daily.) 360 tablet 3   Microlet Lancets MISC 1 each by Does not apply route 2 (two) times daily. E11.9 100 each 2   nystatin (MYCOSTATIN) 100000 UNIT/ML suspension Take 5 mLs (500,000 Units total) by mouth 4 (four) times daily. 180 mL 1   pantoprazole  (PROTONIX) 40 MG tablet Take 1 tablet (40 mg total) by mouth 2 (two) times daily. 180 tablet 3   potassium chloride SA (KLOR-CON M20) 20 MEQ tablet Take 1.5 tablets (30 mEq total) by mouth daily. (Patient taking differently: Take 30 mEq by mouth 2 (two) times daily.)     venlafaxine XR (EFFEXOR-XR) 75 MG 24 hr capsule TAKE 1 CAPSULE BY MOUTH TWICE A DAY (Patient taking differently: Take 75 mg by mouth 2 (two) times daily.) 180 capsule 3   benzonatate (TESSALON PERLES) 100 MG capsule Take 1-2 capsules (100-200 mg total) by mouth 3 (three) times daily as needed for cough. (Patient not taking: Reported on 12/10/2022) 30 capsule 0   chlorpheniramine-HYDROcodone (TUSSIONEX) 10-8 MG/5ML Take 5 mLs by mouth at bedtime as needed for cough (do not drive for 8 hours after taking). (Patient not taking: Reported on 12/10/2022) 115 mL 0   ondansetron (ZOFRAN-ODT) 4 MG disintegrating tablet Take 1 tablet (4 mg total) by mouth every 8 (eight) hours as needed for nausea or vomiting. (Patient not taking: Reported on 12/10/2022) 20 tablet 2   telmisartan (MICARDIS) 40 MG tablet Take 1 tablet (40 mg total) by mouth daily. 90 tablet 3   No current facility-administered medications for this visit.     Objective:  BP (!) 150/64   Pulse 98   Temp 97.7 F (36.5 C)   Ht '5\' 6"'$  (1.676 m)   Wt 210 lb 12.8 oz (95.6 kg)   SpO2 98%   BMI 34.02 kg/m  Gen: NAD, resting comfortably Extensive bruising over left eye and in the left eyebrow-2 Prolene sutures noted-1 embedded in scab-able to remove both CV: RRR no murmurs rubs or gallops Lungs: CTAB no crackles, wheeze, rhonchi Ext: no edema Skin: warm, dry     Assessment and Plan    # Fall S: Per emergency department notes patient was walking out of the restaurant when she tripped and fell forward landing on her left knee and hitting her face.  Questionable brief loss of  consciousness- when I discussed with her today she remembers the fall but thinks temporarily lost  consciousness when she hit her head- husband states very brief- husband said she was speaking normally.  She noted a mild headache and some nausea but without vomiting.  She was having difficulty standing up and ambulating due to pain in her left knee.  There was concern for potential facial fracture and had CT of head and face performed-thankfully no fracture was found and no evidence of subdural hematoma.  She also required x-ray of her knee but no fracture or dislocation was noted   She did have a laceration and required 2 sutures with Prolene on left eyebrow.  She reports mild headaches that are improving.  Knee pain has improved as well  No facial or extremity weakness. No slurred words or trouble swallowing. no blurry vision or double vision. No paresthesias. No confusion or word finding difficulties.   A/P: Patient tripped at [place] fell and hit her head as well as her knee.  She came in today for suture removal-we were able to remove both sutures-the second 1 was rather difficult as there proportion of scab had grown into suture but we are able to eventually remove this -Mechanical fall-discussed using cane or walker for prevention but she declines   Headache pattern is improving but gave red flags to return to care or seek care immediately-suspect she had a concussion with the fall.  Knee is improving-monitor  # hypertension S: compliant with Lasix 40 mg daily, diltiazem extended release 120 mg, carvedilol 25 mg BID,  telmisartan 20 mg -prior on imdur on list  Home readings: 150s at home even before the fall BP Readings from Last 3 Encounters:  12/10/22 (!) 150/64  12/04/22 (!) 150/65  11/19/22 (!) 140/70  A/P: Hypertension is poorly controlled even before fall on home readings- Increase telmisartan to '40mg'$  - continue other blood pressure medicines - reports 20 meq twice daily- we will recheck potassium at folow up and see if we need to adjust - 1 month follow up  -Could also  increase diltiazem  Recommended follow up: Return in about 1 month (around 01/10/2023) for followup or sooner if needed.Schedule b4 you leave. Future Appointments  Date Time Provider [place]  12/13/2022 11:30 AM [facility] None  12/15/2022 11:45 AM Elayne Snare, MD [facility] None  01/05/2023  3:45 PM Edrick Kins, DPM [facility]  01/12/2023  2:45 PM Edrick Kins, DPM [facility]  01/13/2023  9:15 AM Darreld Mclean, PA-C [facility] None  01/26/2023  2:15 PM Edrick Kins, DPM [facility]  02/25/2023  9:00 AM [name], MD [facility]  02/28/2023  9:00 AM [name], MD [facility]   Lab/Order associations:   ICD-10-CM   1. Essential hypertension  I10     2. Fall, initial encounter  W19.XXXA     3. Laceration of head without foreign body, unspecified part of head, initial encounter  S01.91XA       Meds ordered this encounter  Medications   telmisartan (MICARDIS) 40 MG tablet    Sig: Take 1 tablet (40 mg total) by mouth daily.    Dispense:  90 tablet    Refill:  3    Return precautions advised.  Garret Reddish, MD

## 2022-12-13 ENCOUNTER — Other Ambulatory Visit (INDEPENDENT_AMBULATORY_CARE_PROVIDER_SITE_OTHER): Payer: Medicare Other

## 2022-12-13 DIAGNOSIS — E1165 Type 2 diabetes mellitus with hyperglycemia: Secondary | ICD-10-CM | POA: Diagnosis not present

## 2022-12-13 DIAGNOSIS — Z794 Long term (current) use of insulin: Secondary | ICD-10-CM

## 2022-12-13 LAB — HEMOGLOBIN A1C: Hgb A1c MFr Bld: 8.2 % — ABNORMAL HIGH (ref 4.6–6.5)

## 2022-12-13 LAB — BASIC METABOLIC PANEL
BUN: 19 mg/dL (ref 6–23)
CO2: 30 mEq/L (ref 19–32)
Calcium: 9.6 mg/dL (ref 8.4–10.5)
Chloride: 100 mEq/L (ref 96–112)
Creatinine, Ser: 1.2 mg/dL (ref 0.40–1.20)
GFR: 48.22 mL/min — ABNORMAL LOW (ref >60.00)
Glucose, Bld: 147 mg/dL — ABNORMAL HIGH (ref 70–99)
Potassium: 4.5 mEq/L (ref 3.5–5.1)
Sodium: 139 mEq/L (ref 135–145)

## 2022-12-15 ENCOUNTER — Ambulatory Visit: Payer: Medicare Other | Admitting: Endocrinology

## 2022-12-15 ENCOUNTER — Encounter: Payer: Self-pay | Admitting: Endocrinology

## 2022-12-15 VITALS — BP 154/80 | HR 93 | Ht 60.0 in | Wt 211.2 lb

## 2022-12-15 DIAGNOSIS — E1165 Type 2 diabetes mellitus with hyperglycemia: Secondary | ICD-10-CM

## 2022-12-15 DIAGNOSIS — Z794 Long term (current) use of insulin: Secondary | ICD-10-CM | POA: Diagnosis not present

## 2022-12-15 DIAGNOSIS — Z8639 Personal history of other endocrine, nutritional and metabolic disease: Secondary | ICD-10-CM | POA: Diagnosis not present

## 2022-12-15 NOTE — Progress Notes (Signed)
Patient ID: [name], female   DOB: 12-28-1960, 62 y.o.   MRN: [id number]           Reason for Appointment: Type II Diabetes follow-up   History of Present Illness   Diagnosis date: 2007 ?  Previous history:  Non-insulin hypoglycemic drugs previously used: Ozempic, metformin Insulin was started a few years after diagnosis  A1c range in the last few years is:6.0-9.9 and since 7/22 consistently higher  Recent history:     Non-insulin hypoglycemic drugs: Metformin [id number] mg a day, Trulicity 4.5 mg weekly     Insulin regimen:  Lantus 20 units at am, [place]remeal NovoLog 05-20-13     Side effects from medications: Ozempic: nausea  Current self management, blood sugar patterns and problems identified:  A1c is 8.2 She has again not followed dosage instructions for her insulin changes since her last visit She was told to take her Lantus in the evening instead of morning and she is still taking it in the morning Also has not increased her NovoLog doses as directed and was recommended at least 18 units at dinnertime She is trying to do better with taking her insulin consistently before meals but may be sometimes late With eating cereal her blood sugars are frequently much higher after breakfast and overall are the highest in the morning postprandially However overnight and pre-meal blood sugars are significantly high with taking only 18 units of Lantus instead of at least 22 recommended previously No hypoglycemia although she thinks her blood sugar was around 73 just after bedtime once Still has no significant weight loss She has been taking her Trulicity fairly regularly Has not done any exercise However her weight appears to be less than in July last She is frequently getting cereal for breakfast   Diet management: Breakfast usually cereal (cheerios) or eggs     Download of Dexcom G7 data for the last 2 weeks is analyzed as below  Overnight blood sugars are fairly persistently  high with occasional episodes of hyperglycemia also although more steady in the last 3 nights, also no hypoglycemia  Premeal blood sugars on average are about 180 at lunch and trending higher at dinnertime  POSTPRANDIAL blood sugar spikes are seen frequently after breakfast at variable times in the mornings Blood sugars after lunch and dinner were generally higher in the first week especially after dinnertime when they were consistently over 180 Blood sugars after lunch and dinner in the second week are more even compared to Premeal readings  On average blood sugars are the highest after breakfast averaging 244 and after dinner 232  CGM use % of time 96  2-week average/GV 195/26  Time in range      42  %  % Time Above 180 44+14  % Time above 250   % Time Below 70      PRE-MEAL Fasting Lunch Dinner Bedtime Overall  Glucose range:       Averages: 184       POST-MEAL PC Breakfast PC Lunch PC Dinner  Glucose range:     Averages: 244  232     Dietician visit: Most recent: Years ago   Weight control:  Wt Readings from Last 3 Encounters:  12/15/22 211 lb 3.2 oz (95.8 kg)  12/10/22 210 lb 12.8 oz (95.6 kg)  11/19/22 214 lb 6.4 oz (97.3 kg)            Diabetes labs:  Lab Results  Component Value Date   HGBA1C  8.2 (H) 12/13/2022   HGBA1C 8.1 (H) 10/12/2022   HGBA1C 8.4 (H) 04/29/2022   Lab Results  Component Value Date   MICROALBUR 2.0 (H) 04/29/2022   LDLCALC 56 10/12/2022   CREATININE 1.20 12/13/2022    Lab Results  Component Value Date   FRUCTOSAMINE 270 10/12/2022     Allergies as of 12/15/2022       Reactions   Firvanq [vancomycin] Other (See Comments)   Dose related nephrotoxicity    Trexall [methotrexate] Other (See Comments)   Peri-oral and buccal lesions   Zestril [lisinopril] Cough   Chlorhexidine Itching   Cleocin [clindamycin] Nausea And Vomiting, Rash   Lincocin [lincomycin Hcl] Nausea And Vomiting, Rash   Teflaro [ceftaroline] Rash, Other (See  Comments)   Tolerates ceftriaxone         Medication List        Accurate as of December 15, 2022  6:08 PM. If you have any questions, ask your nurse or doctor.          acetaminophen 500 MG tablet Commonly known as: TYLENOL Take 1,000 mg by mouth daily as needed for headache, fever, moderate pain or mild pain.   albuterol (2.5 MG/3ML) 0.083% nebulizer solution Commonly known as: PROVENTIL Take 2.5 mg by nebulization every 6 (six) hours as needed for wheezing or shortness of breath.   allopurinol 300 MG tablet Commonly known as: ZYLOPRIM Take 1 tablet (300 mg total) by mouth daily.   aspirin EC 81 MG tablet Take 1 tablet (81 mg total) by mouth daily.   atorvastatin 40 MG tablet Commonly known as: LIPITOR TAKE 1 TABLET BY MOUTH EVERY DAY   benzonatate 100 MG capsule Commonly known as: Tessalon Perles Take 1-2 capsules (100-200 mg total) by mouth 3 (three) times daily as needed for cough.   Breo Ellipta 200-25 MCG/ACT Aepb Generic drug: fluticasone furoate-vilanterol TAKE 1 PUFF BY MOUTH EVERY DAY What changed: See the new instructions.   buPROPion 300 MG 24 hr tablet Commonly known as: WELLBUTRIN XL TAKE 1 TABLET BY MOUTH EVERY DAY   chlorpheniramine-HYDROcodone 10-8 MG/5ML Commonly known as: TUSSIONEX Take 5 mLs by mouth at bedtime as needed for cough (do not drive for 8 hours after taking).   Contour Next Test test strip Generic drug: glucose blood 1 each by Other route 4 (four) times daily. And lancets 4/day   cyclobenzaprine 5 MG tablet Commonly known as: FLEXERIL Take 1 tablet (5 mg total) by mouth 3 (three) times daily as needed for muscle spasms (or Headache). What changed: reasons to take this   Dexcom G7 Sensor Misc 1 Device by Does not apply route as directed. Change sensor every 10 days   diltiazem 120 MG 24 hr capsule Commonly known as: CARDIZEM CD TAKE 1 CAPSULE BY MOUTH EVERY DAY What changed: how much to take   Easy Mini Eject Lancing  Device Misc Use as directed 4 times daily to test blood sugar   estradiol 0.1 MG/GM vaginal cream Commonly known as: ESTRACE Place 0.5 Applicatorfuls vaginally 2 (two) times a week.   fenofibrate 160 MG tablet TAKE 1 TABLET BY MOUTH EVERY DAY   fluticasone 50 MCG/ACT nasal spray Commonly known as: FLONASE Place 2 sprays into both nostrils daily as needed for allergies. What changed:  how much to take when to take this   furosemide 40 MG tablet Commonly known as: LASIX Take 1 tablet (40 mg total) by mouth daily.   gabapentin 300 MG capsule Commonly known as: NEURONTIN  Take at night in addition to the '100mg'$  gabapentin to get '400mg'$  at night and '100mg'$  in the morning and at mid day. What changed:  how much to take how to take this when to take this reasons to take this additional instructions   ipratropium 0.06 % nasal spray Commonly known as: ATROVENT PLACE 2 SPRAYS INTO BOTH NOSTRILS 4 TIMES DAILY. What changed: See the new instructions.   Lantus SoloStar 100 UNIT/ML Solostar Pen Generic drug: insulin glargine Inject 16 Units into the skin every morning. What changed:  how much to take when to take this   Linzess 290 MCG Caps capsule Generic drug: linaclotide TAKE 1 CAPSULE BY MOUTH DAILY BEFORE BREAKFAST.   meloxicam 15 MG tablet Commonly known as: MOBIC Take 1 tablet (15 mg total) by mouth daily.   metFORMIN 500 MG 24 hr tablet Commonly known as: GLUCOPHAGE-XR Take 4 tablets (2,000 mg total) by mouth daily with breakfast. What changed:  how much to take when to take this   Microlet Lancets Misc 1 each by Does not apply route 2 (two) times daily. E11.9   NovoLOG FlexPen 100 UNIT/ML FlexPen Generic drug: insulin aspart Inject 15 Units into the skin daily with supper. What changed:  how much to take when to take this additional instructions   nystatin 100000 UNIT/ML suspension Commonly known as: MYCOSTATIN Take 5 mLs (500,000 Units total) by mouth 4  (four) times daily.   ondansetron 4 MG disintegrating tablet Commonly known as: ZOFRAN-ODT Take 1 tablet (4 mg total) by mouth every 8 (eight) hours as needed for nausea or vomiting.   pantoprazole 40 MG tablet Commonly known as: PROTONIX Take 1 tablet (40 mg total) by mouth 2 (two) times daily.   Pen Needles 32G X 4 MM Misc 1 each by Does not apply route 4 (four) times daily. E11.9   potassium chloride SA 20 MEQ tablet Commonly known as: Klor-Con M20 Take 1.5 tablets (30 mEq total) by mouth daily. What changed: when to take this   telmisartan 40 MG tablet Commonly known as: MICARDIS Take 1 tablet (40 mg total) by mouth daily.   Trulicity 4.5 0000000 Sopn Generic drug: Dulaglutide Inject 4.5 mg as directed once a week. What changed: when to take this   venlafaxine XR 75 MG 24 hr capsule Commonly known as: EFFEXOR-XR TAKE 1 CAPSULE BY MOUTH TWICE A DAY        Allergies:  Allergies  Allergen Reactions   Firvanq [Vancomycin] Other (See Comments)    Dose related nephrotoxicity    Trexall [Methotrexate] Other (See Comments)    Peri-oral and buccal lesions   Zestril [Lisinopril] Cough   Chlorhexidine Itching   Cleocin [Clindamycin] Nausea And Vomiting and Rash   Lincocin [Lincomycin Hcl] Nausea And Vomiting and Rash   Teflaro [Ceftaroline] Rash and Other (See Comments)    Tolerates ceftriaxone     Past Medical History:  Diagnosis Date   Abnormal SPEP 04/17/2014   Acute left ankle pain 01/26/2017   ANEMIA-UNSPECIFIED 09/18/2009   Anxiety    Arthritis    Blood transfusion without reported diagnosis    Cataract    CHF (congestive heart failure) (HCC)    Chronic diastolic heart failure, NYHA class 2 (HCC)    Normal LVEDP by May 2018   COPD (chronic obstructive pulmonary disease) ([place])    Depression    DIABETES MELLITUS, TYPE II 08/21/2006   Diabetic osteomyelitis ([place]) 05/29/2015   Fatty liver    Fracture of 5th metatarsal  non union   GERD [id number]   Had  fundoplication   GOUT [id number]   Hammer toe of right foot    2-5th toes   Hx of umbilical hernia repair    HYPERLIPIDEMIA 08/21/2006   HYPERTENSION 08/21/2006   Infection of wound due to methicillin resistant Staphylococcus aureus (MRSA)    Internal hemorrhoids    Kidney problem    Multiple allergies 10/14/2016   OBESITY 06/04/2009   Onychomycosis 10/27/2015   Osteomyelitis of left foot ([place]) 05/29/2015   Osteoporosis    Pulmonary sarcoidosis ([place])    Followed locally by pulmonology, but also by Dr. Casper Harrison at [facility]   Right knee pain 01/26/2017   Vocal cord dysfunction    Wears partial dentures     Past Surgical History:  Procedure Laterality Date   ABDOMINAL HYSTERECTOMY     APPENDECTOMY     BLADDER SUSPENSION  11/11/2011   Procedure: TRANSVAGINAL TAPE (TVT) PROCEDURE;  Surgeon: Olga Millers, MD;  Location: [facility];  Service: Gynecology;  Laterality: N/A;   CAROTIDS  02/18/2011   CAROTID DUPLEX; VERTEBRALS ARE PATENT WITH ANTEGRADE FLOW. ICA/CCA RATIO 1.61 ON RIGHT AND 0.75 ON LEFT   CATARACT EXTRACTION, BILATERAL     CHOLECYSTECTOMY  1984   COLONOSCOPY  04/29/2010   [name]   CYSTOSCOPY  11/11/2011   Procedure: CYSTOSCOPY;  Surgeon: Olga Millers, MD;  Location: [facility];  Service: Gynecology;  Laterality: N/A;   EXTUBATION (ENDOTRACHEAL) IN OR N/A 09/23/2017   Procedure: EXTUBATION (ENDOTRACHEAL) IN OR;  Surgeon: Helayne Seminole, MD;  Location: [place];  Service: ENT;  Laterality: N/A;   FIBEROPTIC LARYNGOSCOPY AND TRACHEOSCOPY N/A 09/23/2017   Procedure: FLEXIBLE FIBEROPTIC LARYNGOSCOPY;  Surgeon: Helayne Seminole, MD;  Location: [place];  Service: ENT;  Laterality: N/A;   FRACTURE SURGERY     foot   [place] Left 10/02/2018   Procedure: HALLUX ARTHRODESIS;  Surgeon: Edrick Kins, DPM;  Location: [facility];  Service: Podiatry;  Laterality: Left;   HAMMER TOE SURGERY Left 10/02/2018   Procedure: HAMMER TOE CORRECTION 2ND 3RD 4RD FIFTH TOE;   Surgeon: Edrick Kins, DPM;  Location: [facility];  Service: Podiatry;  Laterality: Left;   HAMMER TOE SURGERY Right 04/12/2019   Procedure: HAMMER TOE CORRECTION, 2ND, 3RD, 4TH AND 5TH TOES OF RIGHT FOOT;  Surgeon: Edrick Kins, DPM;  Location: [place];  Service: Podiatry;  Laterality: Right;   HAMMER TOE SURGERY Left 10/25/2019   Procedure: HAMMER TOE REPAIR SECOND, THIRD, FOUTH;  Surgeon: Edrick Kins, DPM;  Location: [place] [facility];  Service: Podiatry;  Laterality: Left;   HERNIA REPAIR     I & D EXTREMITY Left 06/27/2015   Procedure: Partial Excision Left Calcaneus, Place Antibiotic Beads, and Wound VAC;  Surgeon: Newt Minion, MD;  Location: [place];  Service: Orthopedics;  Laterality: Left;   JOINT REPLACEMENT     KNEE ARTHROSCOPY     right   LEFT AND RIGHT HEART CATHETERIZATION WITH CORONARY ANGIOGRAM N/A 04/23/2013   Procedure: LEFT AND RIGHT HEART CATHETERIZATION WITH CORONARY ANGIOGRAM;  Surgeon: Leonie Man, MD;  Location: [facility];  Service: Cardiovascular;  Laterality: N/A;   LEFT HEART CATH AND CORONARY ANGIOGRAPHY N/A 03/11/2017   Procedure: Left Heart Cath and Coronary Angiography;  Surgeon: Sherren Mocha, MD;  Location: [place] [facility]; angiographically minimal CAD in the LAD otherwise normal.;  Normal LVEDP.  FALSE POSITIVE MYOVIEW   LEFT HEART CATH AND  CORONARY ANGIOGRAPHY  07/20/2010   LVEF 50-55% WITH VERY MILD GLOBAL HYPOKINESIA; ESSENTIALLY NORMAL CORONARY ARTERIES; NORMAL LV FUNCTION   METATARSAL OSTEOTOMY WITH OPEN REDUCTION INTERNAL FIXATION (ORIF) METATARSAL WITH FUSION Left 04/09/2014   Procedure: LEFT FOOT FRACTURE OPEN TREATMENT METATARSAL INCLUDES INTERNAL FIXATION EACH;  Surgeon: Lorn Junes, MD;  Location: [place];  Service: Orthopedics;  Laterality: Left;   NISSEN FUNDOPLICATION  [id number]   NM MYOVIEW LTD --> FALSE POSITIVE  03/10/2017   : Moderate size "stress-induced "perfusion defect at the apex as well as "ill-defined stress-induced  perfusion defect in the lateral wall.  EF 55%.  INTERMEDIATE risk. -->  FALSE POSITIVE   ORIF DISTAL FEMUR FRACTURE  08/08/2020   [facility]: ORIF of left extra-articular periprosthetic distal femur fracture with synthesis of the femur plate with cortical nonlocking screws.  (Thought to be pathologic fracture due to osteoporosis) -> after a fall   ORIF FEMUR W/ PERI-IMPLANT  08/29/2020   [facility]) revision of left femur fracture ORIF plate fixation screws; culture positive for MSSA   Right and left CARDIAC CATHETERIZATION  04/23/2013   Angiographic normal coronaries; LVEDP 20 mmHg, PCWP 12-14 mmHg, RAP 12 mmHg.; [place]ick CO/CI 4.9/2.2   ROTATOR CUFF REPAIR Right 12/09/2021   SHOULDER ARTHROSCOPY Left 03/14/2019   Procedure: LEFT SHOULDER ARTHROSCOPY, DEBRIDEMENT, AND DECOMPRESSION;  Surgeon: Newt Minion, MD;  Location: [place];  Service: Orthopedics;  Laterality: Left;   TOTAL KNEE ARTHROPLASTY Right 06/29/2017   Procedure: RIGHT TOTAL KNEE ARTHROPLASTY;  Surgeon: Newt Minion, MD;  Location: [place];  Service: Orthopedics;  Laterality: Right;   TOTAL KNEE ARTHROPLASTY Left 12/07/2017   TOTAL KNEE ARTHROPLASTY Left 12/07/2017   Procedure: LEFT TOTAL KNEE ARTHROPLASTY;  Surgeon: Newt Minion, MD;  Location: [place];  Service: Orthopedics;  Laterality: Left;   TRACHEOSTOMY TUBE PLACEMENT N/A 09/20/2017   Procedure: AWAKE INTUBATION WITH ANESTHESIA WITH VIDEO ASSISTANCE;  Surgeon: Helayne Seminole, MD;  Location: [place] [facility];  Service: ENT;  Laterality: N/A;   TRANSTHORACIC ECHOCARDIOGRAM  08/2014   Normal LV size and function.  Mild LVH.  EF 55-60%.  Normal regional wall motion.  GR 1 DD.  Normal RV size and function .   TRANSTHORACIC ECHOCARDIOGRAM  08/12/2020    [facility]): Normal LV size and function.  EF 55 to 60%.  No RWM A.  Normal filling pattern.  Normal RV.  No significant valvular disease-stenosis or regurgitation.  No vegetation.   TUBAL LIGATION      with reversal in [place]      Family History  Problem Relation Age of Onset   Diabetes Father    Heart attack Father 53   Coronary artery disease Father    Heart failure Father    Throat cancer Father    Hypertension Father    Heart disease Father    Sleep apnea Father    Obesity Father    COPD Mother    Emphysema Mother    Asthma Mother    Heart failure Mother    Breast cancer Mother    Diabetes Mother    Kidney disease Mother    Thyroid disease Mother    Heart attack Maternal Grandfather    Sarcoidosis Maternal Uncle    Lung cancer Brother    Diabetes Brother    Colon cancer Neg Hx    Colon polyps Neg  Hx    Esophageal cancer Neg Hx    Rectal cancer Neg Hx    Stomach cancer Neg Hx     Social History:  reports that she has never smoked. She has never used smokeless tobacco. She reports that she does not drink alcohol and does not use drugs.  Review of Systems   Last diabetic eye exam date 03/29/2022  Last foot exam date: 10/22  Symptoms of neuropathy: She has had some burning and pain in her feet and legs, generally controlled with gabapentin at bedtime   Hypertension:   Treatment includes Micardis 20 mg followed elsewhere  BP Readings from Last 3 Encounters:  12/15/22 (!) 154/80  12/10/22 (!) 150/68  12/04/22 (!) 150/65   Renal function improving and potassium stable  Lab Results  Component Value Date   CREATININE 1.20 12/13/2022   CREATININE 1.10 (H) 11/11/2022   CREATININE 1.25 (H) 11/10/2022   Lab Results  Component Value Date   K 4.5 12/13/2022      Lipid management: She has had mixed hyperlipidemia This is followed by her PCP, treated with Lipitor and fenofibrate    Lab Results  Component Value Date   CHOL 137 10/12/2022   CHOL 164 07/21/2021   CHOL 162 03/31/2020   Lab Results  Component Value Date   HDL 50.60 10/12/2022   HDL 49.00 07/21/2021   HDL 40 03/31/2020   Lab  Results  Component Value Date   LDLCALC 56 10/12/2022   LDLCALC 84 03/31/2020   LDLCALC 49 03/10/2017   Lab Results  Component Value Date   TRIG 154.0 (H) 10/12/2022   TRIG 260.0 (H) 07/21/2021   TRIG 227 (H) 03/31/2020   Lab Results  Component Value Date   CHOLHDL 3 10/12/2022   CHOLHDL 3 07/21/2021   CHOLHDL 5 01/22/2020   Lab Results  Component Value Date   LDLDIRECT 84.0 07/21/2021   LDLDIRECT 68.0 01/22/2020   LDLDIRECT 86.0 07/04/2019     Examination:   BP (!) 154/80 (BP Location: Left Arm, Patient Position: Sitting, Cuff Size: Normal)   Pulse 93   Ht 5' (1.524 m)   Wt 211 lb 3.2 oz (95.8 kg)   SpO2 95%   BMI 41.25 kg/m   Body mass index is 41.25 kg/m.    ASSESSMENT/ PLAN:    Diabetes type 2 with obesity, hypertension and hyperlipidemia:   Current regimen: Once a day Lantus and NovoLog, Trulicity 4.5 mg and metformin  See history of present illness for detailed discussion of current diabetes management, blood sugar patterns and problems identified  A1c is 8.2 and about the same  Blood glucose control has been consistently poor   Given with better glucose monitoring and reliable readings from Dexcom her blood sugars are still out of control and she has not increased her insulin as directed Also with high carbohydrate meals or snacks such as cereal or fruit blood sugars are significantly higher Blood sugar patterns and difficulties with control and adjustment of insulin was discussed in detail today  Recommendations:  Change LANTUS to 20 units instead of 18 and next week may need to use 22 units based on fasting blood sugar target of at least 140 or less Cut back on sweets, grapes and have more protein snacks Will switch to [place] on the next refill to provide better and more stable control over 24 hours At least 12 units to be used at breakfast when she is eating cereal and add protein Adjust mealtime doses  up or down 2 to 3 units for variable  carbohydrate intake If eating a large snack at night may need small NovoLog coverage also Restart exercise  Hyperkalemia: Resolved, needs to cut back on high potassium foods again  Patient Instructions  Lantus 20 and next week if am sugars > 140 go to 22 units  For cereal take 12 Novolog   Less grapes, sweet fruits  Call Before refilling Lantus to go to [place] 12/15/2022, 6:08 PM

## 2022-12-15 NOTE — Patient Instructions (Addendum)
Lantus 20 and next week if am sugars > 140 go to 22 units  For cereal take 12 Novolog   Less grapes, sweet fruits  Call Before refilling Lantus to go to [place]

## 2022-12-29 ENCOUNTER — Encounter: Payer: Self-pay | Admitting: Endocrinology

## 2022-12-29 DIAGNOSIS — M79645 Pain in left finger(s): Secondary | ICD-10-CM | POA: Diagnosis not present

## 2022-12-29 DIAGNOSIS — M25532 Pain in left wrist: Secondary | ICD-10-CM | POA: Diagnosis not present

## 2022-12-30 ENCOUNTER — Encounter: Payer: Self-pay | Admitting: Podiatry

## 2022-12-30 ENCOUNTER — Other Ambulatory Visit: Payer: Self-pay | Admitting: Podiatry

## 2022-12-30 ENCOUNTER — Other Ambulatory Visit: Payer: Self-pay | Admitting: Orthopedic Surgery

## 2022-12-30 ENCOUNTER — Encounter: Payer: Self-pay | Admitting: Orthopedic Surgery

## 2022-12-30 DIAGNOSIS — M2042 Other hammer toe(s) (acquired), left foot: Secondary | ICD-10-CM | POA: Diagnosis not present

## 2022-12-30 DIAGNOSIS — S6992XA Unspecified injury of left wrist, hand and finger(s), initial encounter: Secondary | ICD-10-CM

## 2022-12-30 MED ORDER — OXYCODONE-ACETAMINOPHEN 5-325 MG PO TABS: 1.0000 | ORAL_TABLET | ORAL | 0 refills | Status: DC | PRN

## 2022-12-30 MED ORDER — IBUPROFEN 800 MG PO TABS: 800.0000 mg | ORAL_TABLET | Freq: Three times a day (TID) | ORAL | 1 refills | Status: DC

## 2022-12-30 NOTE — Progress Notes (Signed)
PRN postop 

## 2023-01-04 ENCOUNTER — Other Ambulatory Visit: Payer: Self-pay | Admitting: Family Medicine

## 2023-01-04 ENCOUNTER — Other Ambulatory Visit: Payer: Self-pay | Admitting: Cardiology

## 2023-01-05 ENCOUNTER — Ambulatory Visit (INDEPENDENT_AMBULATORY_CARE_PROVIDER_SITE_OTHER): Payer: BC Managed Care – PPO | Admitting: Podiatry

## 2023-01-05 ENCOUNTER — Ambulatory Visit (INDEPENDENT_AMBULATORY_CARE_PROVIDER_SITE_OTHER): Payer: BC Managed Care – PPO

## 2023-01-05 DIAGNOSIS — M2042 Other hammer toe(s) (acquired), left foot: Secondary | ICD-10-CM | POA: Diagnosis not present

## 2023-01-05 DIAGNOSIS — M7671 Peroneal tendinitis, right leg: Secondary | ICD-10-CM

## 2023-01-05 DIAGNOSIS — M2041 Other hammer toe(s) (acquired), right foot: Secondary | ICD-10-CM

## 2023-01-05 DIAGNOSIS — Z9889 Other specified postprocedural states: Secondary | ICD-10-CM

## 2023-01-09 NOTE — Progress Notes (Deleted)
Cardiology Office Note:    Date:  01/09/2023   ID:  [name], DOB Feb 12, 1961, MRN [id number]  PCP:  [name], MD  Cardiologist:  [name], MD  Electrophysiologist:  None   Referring MD: [name], MD   Chief Complaint: routine follow-up of CHF  History of Present Illness:    [name] is a 62 y.o. female with a history of minimal CAD on cardiac catheterization in 123XX123, chronic diastolic CHF, pulmonary sarcoidosis, COPD, hypertension, hyperlipidemia, type 2 diabetes mellitus, GERD, fatty liver disease, and vocal cord dysfunction who is followed by Dr. Ellyn Hack and presents today for routine follow-up of CHF.  Patient is primarily followed by Dr. Ellyn Hack for chronic diastolic CHF. Echos in 2012 and again in 2015 showed normal LV function and grade 1 diastolic dysfunction. She has intermittent chest pain over the years and has undergone multiple stress tests. Last ischemic evaluation was a left cardiac catheterization in 03/2017 which showed only minimal non-obstructive irregularities of the LAD but otherwise normal coronaries. LVEDP was normal. Monitor in 07/2018 for further evaluation of rapid palpitations  showed underlying sinus rhythm with occasional PVCs but no significant arrhythmias.   She was last seen by Dr. Ellyn Hack in 11/2021 at which time she continued to have her baseline dyspnea which was stable and was otherwise doing well from a cardiac standpoint.  Since last office visit, she was admitted in 05/2022 for COPD exacerbation. Echo during this admission showed LVEF of 55-60% with normal wall motion and grade 1 diastolic dysfunction. She was recently admitted again from 11/09/2022 to 11/12/2022 for acute hypoxic respiratory failure secondary to COPD exacerbation and influenza pneumonia.  Patient presents today for follow-up. ***  Chronic Diastolic CHF Last Echo in 05/2022 showed LVEF of 55-60% with normal wall motion and grade 1 diastolic  dysfunction.  - Euvolemic on exam.  - Continue Lasix 40mg  daily.  - Discussed importance of daily weights and sodium/ fluid restrictions.  Hypertension BP well controlled. *** - Continue current medications: Diltiazem 120mg  daily and Telmisartan 40mg  daily.   Hyperlipidemia Lipid panel in 10/2022: Total Cholesterol 137, Triglycerides 154, HDL 50.60, LDL 56.  - Continue Lipitor 40mg  daily.  Type 2 Diabetes Mellitus Hemoglobin A1c 8.2% in 12/2022. - On Metformin, Trulicity, and Insulin.  - Management per Endocrinology.  Pulmonary Sarcoidosis  COPD - Followed by PCP.  Past Medical History:  Diagnosis Date   Abnormal SPEP 04/17/2014   Acute left ankle pain 01/26/2017   ANEMIA-UNSPECIFIED 09/18/2009   Anxiety    Arthritis    Blood transfusion without reported diagnosis    Cataract    CHF (congestive heart failure) (HCC)    Chronic diastolic heart failure, NYHA class 2 (HCC)    Normal LVEDP by May 2018   COPD (chronic obstructive pulmonary disease) ([place])    Depression    DIABETES MELLITUS, TYPE II 08/21/2006   Diabetic osteomyelitis ([place]) 05/29/2015   Fatty liver    Fracture of 5th metatarsal    non union   GERD [id number]   Had fundoplication   GOUT [id number]   Hammer toe of right foot    2-5th toes   Hx of umbilical hernia repair    HYPERLIPIDEMIA 08/21/2006   HYPERTENSION 08/21/2006   Infection of wound due to methicillin resistant Staphylococcus aureus (MRSA)    Internal hemorrhoids    Kidney problem    Multiple allergies 10/14/2016   OBESITY 06/04/2009   Onychomycosis 10/27/2015   Osteomyelitis of left  foot ([place]) 05/29/2015   Osteoporosis    Pulmonary sarcoidosis ([place])    Followed locally by pulmonology, but also by Dr. Casper Harrison at [facility]   Right knee pain 01/26/2017   Vocal cord dysfunction    Wears partial dentures     Past Surgical History:  Procedure Laterality Date   ABDOMINAL HYSTERECTOMY     APPENDECTOMY     BLADDER SUSPENSION  11/11/2011    Procedure: TRANSVAGINAL TAPE (TVT) PROCEDURE;  Surgeon: Olga Millers, MD;  Location: [facility];  Service: Gynecology;  Laterality: N/A;   CAROTIDS  02/18/2011   CAROTID DUPLEX; VERTEBRALS ARE PATENT WITH ANTEGRADE FLOW. ICA/CCA RATIO 1.61 ON RIGHT AND 0.75 ON LEFT   CATARACT EXTRACTION, BILATERAL     CHOLECYSTECTOMY  1984   COLONOSCOPY  04/29/2010   [name]   CYSTOSCOPY  11/11/2011   Procedure: CYSTOSCOPY;  Surgeon: Olga Millers, MD;  Location: [facility] [facility];  Service: Gynecology;  Laterality: N/A;   EXTUBATION (ENDOTRACHEAL) IN OR N/A 09/23/2017   Procedure: EXTUBATION (ENDOTRACHEAL) IN OR;  Surgeon: Helayne Seminole, MD;  Location: [place];  Service: ENT;  Laterality: N/A;   FIBEROPTIC LARYNGOSCOPY AND TRACHEOSCOPY N/A 09/23/2017   Procedure: FLEXIBLE FIBEROPTIC LARYNGOSCOPY;  Surgeon: Helayne Seminole, MD;  Location: [place];  Service: ENT;  Laterality: N/A;   FRACTURE SURGERY     foot   [place] Left 10/02/2018   Procedure: HALLUX ARTHRODESIS;  Surgeon: Edrick Kins, DPM;  [facility];  Service: Podiatry;  Laterality: Left;   HAMMER TOE SURGERY Left 10/02/2018   Procedure: HAMMER TOE CORRECTION 2ND 3RD 4RD FIFTH TOE;  Surgeon: Edrick Kins, DPM;  [facility];  Service: Podiatry;  Laterality: Left;   HAMMER TOE SURGERY Right 04/12/2019   Procedure: HAMMER TOE CORRECTION, 2ND, 3RD, 4TH AND 5TH TOES OF RIGHT FOOT;  Surgeon: Edrick Kins, DPM;  Location: [place];  Service: Podiatry;  Laterality: Right;   HAMMER TOE SURGERY Left 10/25/2019   Procedure: HAMMER TOE REPAIR SECOND, THIRD, FOUTH;  Surgeon: Edrick Kins, DPM;  Location: [facility];  Service: Podiatry;  Laterality: Left;   HERNIA REPAIR     I & D EXTREMITY Left 06/27/2015   Procedure: Partial Excision Left Calcaneus, Place Antibiotic Beads, and Wound VAC;  Surgeon: Newt Minion, MD;  Location: [place];  Service: Orthopedics;  Laterality: Left;   JOINT REPLACEMENT     KNEE ARTHROSCOPY     right   LEFT AND RIGHT  HEART CATHETERIZATION WITH CORONARY ANGIOGRAM N/A 04/23/2013   Procedure: LEFT AND RIGHT HEART CATHETERIZATION WITH CORONARY ANGIOGRAM;  Surgeon: Leonie Man, MD;  Location: [facility] CATH LAB;  Service: Cardiovascular;  Laterality: N/A;   LEFT HEART CATH AND CORONARY ANGIOGRAPHY N/A 03/11/2017   Procedure: Left Heart Cath and Coronary Angiography;  Surgeon: Sherren Mocha, MD;  Location: [place] CV LAB; angiographically minimal CAD in the LAD otherwise normal.;  Normal LVEDP.  FALSE POSITIVE MYOVIEW   LEFT HEART CATH AND CORONARY ANGIOGRAPHY  07/20/2010   LVEF 50-55% WITH VERY MILD GLOBAL HYPOKINESIA; ESSENTIALLY NORMAL CORONARY ARTERIES; NORMAL LV FUNCTION   METATARSAL OSTEOTOMY WITH OPEN REDUCTION INTERNAL FIXATION (ORIF) METATARSAL WITH FUSION Left 04/09/2014   Procedure: LEFT FOOT FRACTURE OPEN TREATMENT METATARSAL INCLUDES INTERNAL FIXATION EACH;  Surgeon: Lorn Junes, MD;  Location: [place];  Service: Orthopedics;  Laterality: Left;   NISSEN FUNDOPLICATION  [id number]   NM MYOVIEW LTD --> FALSE POSITIVE  03/10/2017   :  Moderate size "stress-induced "perfusion defect at the apex as well as "ill-defined stress-induced perfusion defect in the lateral wall.  EF 55%.  INTERMEDIATE risk. -->  FALSE POSITIVE   ORIF DISTAL FEMUR FRACTURE  08/08/2020   [facility]: ORIF of left extra-articular periprosthetic distal femur fracture with synthesis of the femur plate with cortical nonlocking screws.  (Thought to be pathologic fracture due to osteoporosis) -> after a fall   ORIF FEMUR W/ PERI-IMPLANT  08/29/2020   [facility]) revision of left femur fracture ORIF plate fixation screws; culture positive for MSSA   Right and left CARDIAC CATHETERIZATION  04/23/2013   Angiographic normal coronaries; LVEDP 20 mmHg, PCWP 12-14 mmHg, RAP 12 mmHg.; Fick CO/CI 4.9/2.2   ROTATOR CUFF REPAIR Right 12/09/2021   SHOULDER ARTHROSCOPY Left 03/14/2019   Procedure: LEFT SHOULDER  ARTHROSCOPY, DEBRIDEMENT, AND DECOMPRESSION;  Surgeon: Newt Minion, MD;  Location: [place];  Service: Orthopedics;  Laterality: Left;   TOTAL KNEE ARTHROPLASTY Right 06/29/2017   Procedure: RIGHT TOTAL KNEE ARTHROPLASTY;  Surgeon: Newt Minion, MD;  Location: [place];  Service: Orthopedics;  Laterality: Right;   TOTAL KNEE ARTHROPLASTY Left 12/07/2017   TOTAL KNEE ARTHROPLASTY Left 12/07/2017   Procedure: LEFT TOTAL KNEE ARTHROPLASTY;  Surgeon: Newt Minion, MD;  Location: [place];  Service: Orthopedics;  Laterality: Left;   TRACHEOSTOMY TUBE PLACEMENT N/A 09/20/2017   Procedure: AWAKE INTUBATION WITH ANESTHESIA WITH VIDEO ASSISTANCE;  Surgeon: Helayne Seminole, MD;  Location: [facility] [facility];  Service: ENT;  Laterality: N/A;   TRANSTHORACIC ECHOCARDIOGRAM  08/2014   Normal LV size and function.  Mild LVH.  EF 55-60%.  Normal regional wall motion.  GR 1 DD.  Normal RV size and function .   TRANSTHORACIC ECHOCARDIOGRAM  08/12/2020    [facility]): Normal LV size and function.  EF 55 to 60%.  No RWM A.  Normal filling pattern.  Normal RV.  No significant valvular disease-stenosis or regurgitation.  No vegetation.   TUBAL LIGATION     with reversal in [place] REPAIR      Current Medications: No outpatient medications have been marked as taking for the 01/13/23 encounter (Appointment) with Darreld Mclean, PA-C.     Allergies:   Firvanq [vancomycin], Trexall [methotrexate], Zestril [lisinopril], Chlorhexidine, Cleocin [clindamycin], Lincocin [lincomycin hcl], and Teflaro [ceftaroline]   Social History   Socioeconomic History   Marital status: Married    Spouse name: TERIN KAHRS   Number of children: 2   Years of education: 12   Highest education level: Not on file  Occupational History   Occupation: DISABLED  Tobacco Use   Smoking status: Never   Smokeless tobacco: Never  Vaping Use   Vaping Use: Never used  Substance  and Sexual Activity   Alcohol use: No    Alcohol/week: 0.0 standard drinks of alcohol   Drug use: No   Sexual activity: Yes    Birth control/protection: Surgical  Other Topics Concern   Not on file  Social History Narrative   Married 1994. 2 sons who both live close and 1 grandson.       Disability due to sarcoidosis. 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 [employer]   Social Determinants of [employer]  Strain: Low Risk  (02/15/2022)   Overall Financial Resource Strain (CARDIA)    Difficulty of Paying Living Expenses: Not hard at all  Food Insecurity: No Food Insecurity (11/10/2022)   Hunger Vital Sign    Worried About Running Out of Food in the Last Year: Never true    Ran Out of Food in the Last Year: Never true  Transportation Needs: No Transportation Needs (11/10/2022)   PRAPARE - Hydrologist (Medical): No    Lack of Transportation (Non-Medical): No  Physical Activity: Sufficiently Active (02/15/2022)   Exercise Vital Sign    Days of Exercise per Week: 3 days    Minutes of Exercise per Session: 60 min  Stress: No Stress Concern Present (02/15/2022)   [place]    Feeling of Stress : Not at all  Social Connections: Moderately Integrated (02/15/2022)   Social Connection and Isolation Panel [NHANES]    Frequency of Communication with Friends and Family: More than three times a week    Frequency of Social Gatherings with Friends and Family: More than three times a week    Attends Religious Services: More than 4 times per year    Active Member of [employer] or Organizations: No    Attends Music therapist: Never    Marital Status: Married     Family History: The patient's family history includes Asthma in her mother; Breast cancer in  her mother; COPD in her mother; Coronary artery disease in her father; Diabetes in her brother, father, and mother; Emphysema in her mother; Heart attack in her maternal grandfather; Heart attack (age of onset: 16) in her father; Heart disease in her father; Heart failure in her father and mother; Hypertension in her father; Kidney disease in her mother; Lung cancer in her brother; Obesity in her father; Sarcoidosis in her maternal uncle; Sleep apnea in her father; Throat cancer in her father; Thyroid disease in her mother. There is no history of Colon cancer, Colon polyps, Esophageal cancer, Rectal cancer, or Stomach cancer.  ROS:   Please see the history of present illness.     EKGs/Labs/Other Studies Reviewed:    The following studies were reviewed :  Left Cardiac Catheterization 03/11/2017: 1. Angiographically normal left main, RCA, and LCx 2. Minimal nonobstructive irregularity in the LAD 3. Normal LVEDP _______________  Monitor 07/20/2018 to 08/18/2018: Mostly sinus rhythm: Rates 39 to 220 bpm. Average 79 bpm. Roughly 2% PVCs and less than 1% PACs. No arrhythmias: No A. fib, PAT, MAT, SVT or VT.   Relatively normal study with occasional PVCs. _______________  Echocardiogram 06/08/2022: Impressions: 1. Left ventricular ejection fraction, by estimation, is 55 to 60%. The  left ventricle has normal function. The left ventricle has no regional  wall motion abnormalities. The left ventricular internal cavity size was  moderately dilated. Left ventricular  diastolic parameters are consistent with Grade I diastolic dysfunction  (impaired relaxation).   2. Right ventricular systolic function is normal. The right ventricular  size is normal.   3. Left atrial size was mildly dilated.   4. A small pericardial effusion is present.   5. The mitral valve is normal in structure. No evidence of mitral valve  regurgitation. No evidence of mitral stenosis.   6. The aortic valve was not well  visualized. Aortic valve regurgitation  is not visualized. No aortic stenosis is present.   7. The inferior vena cava is normal in size with  greater than 50%  respiratory variability, suggesting right atrial pressure of 3 mmHg.   Comparison(s): Prior images unable to be directly viewed, comparison made  by report only. No significant change from prior study.    EKG:  EKG not ordered today.   Recent Labs: 11/09/2022: ALT 36; B Natriuretic Peptide 21.0 11/10/2022: Hemoglobin 10.4; Platelets 343 12/13/2022: BUN 19; Creatinine, Ser 1.20; Potassium 4.5; Sodium 139  Recent Lipid Panel    Component Value Date/Time   CHOL 137 10/12/2022 0931   CHOL 162 03/31/2020 1316   TRIG 154.0 (H) 10/12/2022 0931   TRIG 168 (H) 09/14/2006 1033   HDL 50.60 10/12/2022 0931   HDL 40 03/31/2020 1316   CHOLHDL 3 10/12/2022 0931   VLDL 30.8 10/12/2022 0931   LDLCALC 56 10/12/2022 0931   LDLCALC 84 03/31/2020 1316   LDLDIRECT 84.0 07/21/2021 0852    Physical Exam:    Vital Signs: There were no vitals taken for this visit.    Wt Readings from Last 3 Encounters:  12/15/22 211 lb 3.2 oz (95.8 kg)  12/10/22 210 lb 12.8 oz (95.6 kg)  11/19/22 214 lb 6.4 oz (97.3 kg)     General: 62 y.o. female in no acute distress. HEENT: Normocephalic and atraumatic. Sclera clear. EOMs intact. Neck: Supple. No carotid bruits. No JVD. Heart: *** RRR. Distinct S1 and S2. No murmurs, gallops, or rubs. Radial and distal pedal pulses 2+ and equal bilaterally. Lungs: No increased work of breathing. Clear to ausculation bilaterally. No wheezes, rhonchi, or rales.  Abdomen: Soft, non-distended, and non-tender to palpation. Bowel sounds present in all 4 quadrants.  MSK: Normal strength and tone for age. *** Extremities: No lower extremity edema.    Skin: Warm and dry. Neuro: Alert and oriented x3. No focal deficits. Psych: Normal affect. Responds appropriately.   Assessment:    No diagnosis found.  Plan:      Disposition: Follow up in ***   Medication Adjustments/Labs and Tests Ordered: Current medicines are reviewed at length with the patient today.  Concerns regarding medicines are outlined above.  No orders of the defined types were placed in this encounter.  No orders of the defined types were placed in this encounter.   There are no Patient Instructions on file for this visit.   Signed, Darreld Mclean, PA-C  01/09/2023 5:24 PM    [place]

## 2023-01-12 ENCOUNTER — Ambulatory Visit (INDEPENDENT_AMBULATORY_CARE_PROVIDER_SITE_OTHER): Payer: BC Managed Care – PPO | Admitting: Podiatry

## 2023-01-12 DIAGNOSIS — Z9889 Other specified postprocedural states: Secondary | ICD-10-CM

## 2023-01-12 NOTE — Progress Notes (Signed)
Chief Complaint  Patient presents with   Routine Post Op    POV # 2 DOS 12/30/22 --- HAMMERTOE ARTHROPLASTY DIGITS 2,3 LEFT FOOT, NO N/V/F/C/SOB, patient denies any pain,  suture were removed today, TX: cam boot     Subjective:  Patient presents today status post hammertoe arthroplasty of the second and third digit left foot.  Patient doing well.  WBAT in the cam boot.  No new complaints at this time  Past Medical History:  Diagnosis Date   Abnormal SPEP 04/17/2014   Acute left ankle pain 01/26/2017   ANEMIA-UNSPECIFIED 09/18/2009   Anxiety    Arthritis    Blood transfusion without reported diagnosis    Cataract    CHF (congestive heart failure) (HCC)    Chronic diastolic heart failure, NYHA class 2 (HCC)    Normal LVEDP by May 2018   COPD (chronic obstructive pulmonary disease) ([place])    Depression    DIABETES MELLITUS, TYPE II 08/21/2006   Diabetic osteomyelitis ([place]) 05/29/2015   Fatty liver    Fracture of 5th metatarsal    non union   GERD [id number]   Had fundoplication   GOUT [id number]   Hammer toe of right foot    2-5th toes   Hx of umbilical hernia repair    HYPERLIPIDEMIA 08/21/2006   HYPERTENSION 08/21/2006   Infection of wound due to methicillin resistant Staphylococcus aureus (MRSA)    Internal hemorrhoids    Kidney problem    Multiple allergies 10/14/2016   OBESITY 06/04/2009   Onychomycosis 10/27/2015   Osteomyelitis of left foot ([place]) 05/29/2015   Osteoporosis    Pulmonary sarcoidosis ([place])    Followed locally by pulmonology, but also by Dr. Casper Harrison at [facility]   Right knee pain 01/26/2017   Vocal cord dysfunction    Wears partial dentures     Past Surgical History:  Procedure Laterality Date   ABDOMINAL HYSTERECTOMY     APPENDECTOMY     BLADDER SUSPENSION  11/11/2011   Procedure: TRANSVAGINAL TAPE (TVT) PROCEDURE;  Surgeon: Olga Millers, MD;  Location: [facility];  Service: Gynecology;  Laterality: N/A;   CAROTIDS  02/18/2011   CAROTID  DUPLEX; VERTEBRALS ARE PATENT WITH ANTEGRADE FLOW. ICA/CCA RATIO 1.61 ON RIGHT AND 0.75 ON LEFT   CATARACT EXTRACTION, BILATERAL     CHOLECYSTECTOMY  1984   COLONOSCOPY  04/29/2010   [name]   CYSTOSCOPY  11/11/2011   Procedure: CYSTOSCOPY;  Surgeon: Olga Millers, MD;  Location: [facility];  Service: Gynecology;  Laterality: N/A;   EXTUBATION (ENDOTRACHEAL) IN OR N/A 09/23/2017   Procedure: EXTUBATION (ENDOTRACHEAL) IN OR;  Surgeon: Helayne Seminole, MD;  Location: [place];  Service: ENT;  Laterality: N/A;   FIBEROPTIC LARYNGOSCOPY AND TRACHEOSCOPY N/A 09/23/2017   Procedure: FLEXIBLE FIBEROPTIC LARYNGOSCOPY;  Surgeon: Helayne Seminole, MD;  Location: [place];  Service: ENT;  Laterality: N/A;   FRACTURE SURGERY     foot   [place] Left 10/02/2018   Procedure: HALLUX ARTHRODESIS;  Surgeon: Edrick Kins, DPM;  [facility];  Service: Podiatry;  Laterality: Left;   HAMMER TOE SURGERY Left 10/02/2018   Procedure: HAMMER TOE CORRECTION 2ND 3RD 4RD FIFTH TOE;  Surgeon: Edrick Kins, DPM;  [facility];  Service: Podiatry;  Laterality: Left;   HAMMER TOE SURGERY Right 04/12/2019   Procedure: HAMMER TOE CORRECTION, 2ND, 3RD, 4TH AND 5TH TOES OF RIGHT FOOT;  Surgeon: Edrick Kins, DPM;  Location: [place];  Service: Podiatry;  Laterality: Right;   HAMMER TOE SURGERY Left 10/25/2019   Procedure: HAMMER TOE REPAIR SECOND, THIRD, FOUTH;  Surgeon: Edrick Kins, DPM;  Location: [place] [facility];  Service: Podiatry;  Laterality: Left;   HERNIA REPAIR     I & D EXTREMITY Left 06/27/2015   Procedure: Partial Excision Left Calcaneus, Place Antibiotic Beads, and Wound VAC;  Surgeon: Newt Minion, MD;  Location: [place];  Service: Orthopedics;  Laterality: Left;   JOINT REPLACEMENT     KNEE ARTHROSCOPY     right   LEFT AND RIGHT HEART CATHETERIZATION WITH CORONARY ANGIOGRAM N/A 04/23/2013   Procedure: LEFT AND RIGHT HEART CATHETERIZATION WITH CORONARY ANGIOGRAM;  Surgeon: Leonie Man, MD;   Location: [facility] CATH LAB;  Service: Cardiovascular;  Laterality: N/A;   LEFT HEART CATH AND CORONARY ANGIOGRAPHY N/A 03/11/2017   Procedure: Left Heart Cath and Coronary Angiography;  Surgeon: Sherren Mocha, MD;  Location: [place] CV LAB; angiographically minimal CAD in the LAD otherwise normal.;  Normal LVEDP.  FALSE POSITIVE MYOVIEW   LEFT HEART CATH AND CORONARY ANGIOGRAPHY  07/20/2010   LVEF 50-55% WITH VERY MILD GLOBAL HYPOKINESIA; ESSENTIALLY NORMAL CORONARY ARTERIES; NORMAL LV FUNCTION   METATARSAL OSTEOTOMY WITH OPEN REDUCTION INTERNAL FIXATION (ORIF) METATARSAL WITH FUSION Left 04/09/2014   Procedure: LEFT FOOT FRACTURE OPEN TREATMENT METATARSAL INCLUDES INTERNAL FIXATION EACH;  Surgeon: Lorn Junes, MD;  Location: [place];  Service: Orthopedics;  Laterality: Left;   NISSEN FUNDOPLICATION  123XX123   NM MYOVIEW LTD --> FALSE POSITIVE  03/10/2017   : Moderate size "stress-induced "perfusion defect at the apex as well as "ill-defined stress-induced perfusion defect in the lateral wall.  EF 55%.  INTERMEDIATE risk. -->  FALSE POSITIVE   ORIF DISTAL FEMUR FRACTURE  08/08/2020   [facility]: ORIF of left extra-articular periprosthetic distal femur fracture with synthesis of the femur plate with cortical nonlocking screws.  (Thought to be pathologic fracture due to osteoporosis) -> after a fall   ORIF FEMUR W/ PERI-IMPLANT  08/29/2020   [facility]) revision of left femur fracture ORIF plate fixation screws; culture positive for MSSA   Right and left CARDIAC CATHETERIZATION  04/23/2013   Angiographic normal coronaries; LVEDP 20 mmHg, PCWP 12-14 mmHg, RAP 12 mmHg.; Fick CO/CI 4.9/2.2   ROTATOR CUFF REPAIR Right 12/09/2021   SHOULDER ARTHROSCOPY Left 03/14/2019   Procedure: LEFT SHOULDER ARTHROSCOPY, DEBRIDEMENT, AND DECOMPRESSION;  Surgeon: Newt Minion, MD;  Location: [place];  Service: Orthopedics;  Laterality: Left;   TOTAL KNEE ARTHROPLASTY Right  06/29/2017   Procedure: RIGHT TOTAL KNEE ARTHROPLASTY;  Surgeon: Newt Minion, MD;  Location: [place];  Service: Orthopedics;  Laterality: Right;   TOTAL KNEE ARTHROPLASTY Left 12/07/2017   TOTAL KNEE ARTHROPLASTY Left 12/07/2017   Procedure: LEFT TOTAL KNEE ARTHROPLASTY;  Surgeon: Newt Minion, MD;  Location: [place];  Service: Orthopedics;  Laterality: Left;   TRACHEOSTOMY TUBE PLACEMENT N/A 09/20/2017   Procedure: AWAKE INTUBATION WITH ANESTHESIA WITH VIDEO ASSISTANCE;  Surgeon: Helayne Seminole, MD;  Location: [place] [facility];  Service: ENT;  Laterality: N/A;   TRANSTHORACIC ECHOCARDIOGRAM  08/2014   Normal LV size and function.  Mild LVH.  EF 55-60%.  Normal regional wall motion.  GR 1 DD.  Normal RV size and function .   TRANSTHORACIC ECHOCARDIOGRAM  08/12/2020    [facility]): Normal LV size and function.  EF 55 to 60%.  No RWM A.  Normal filling pattern.  Normal RV.  No significant valvular disease-stenosis or regurgitation.  No vegetation.   TUBAL LIGATION     with reversal in [place]      Allergies  Allergen Reactions   Firvanq [Vancomycin] Other (See Comments)    Dose related nephrotoxicity    Trexall [Methotrexate] Other (See Comments)    Peri-oral and buccal lesions   Zestril [Lisinopril] Cough   Chlorhexidine Itching   Cleocin [Clindamycin] Nausea And Vomiting and Rash   Lincocin [Lincomycin Hcl] Nausea And Vomiting and Rash   Teflaro [Ceftaroline] Rash and Other (See Comments)    Tolerates ceftriaxone     Objective/Physical Exam Neurovascular status intact.  Incision well coapted with sutures intact. No sign of infectious process noted. No dehiscence. No active bleeding noted.  Minimal edema noted to the second and third digits but overall the toes are in good rectus alignment with good routine healing  Assessment: 1. s/p second and third digit left. DOS: 12/30/2022   Plan of Care:  1. Patient was  evaluated.  Sutures removed 2.  Dressings changed. 3.  Continue WBAT cam boot 4.  Return to clinic 2 weeks for percutaneous pin removal and follow-up x-ray   Edrick Kins, DPM [facility]  Dr. Edrick Kins, DPM    [address], [place] [address]                Office [phone]  Fax [phone]

## 2023-01-13 ENCOUNTER — Ambulatory Visit: Payer: Medicare Other | Admitting: Student

## 2023-01-14 ENCOUNTER — Other Ambulatory Visit: Payer: Self-pay | Admitting: Family Medicine

## 2023-01-17 ENCOUNTER — Ambulatory Visit
Admission: RE | Admit: 2023-01-17 | Discharge: 2023-01-17 | Disposition: A | Discharge status: Home / Self Care | Payer: Medicare Other | Source: Ambulatory Visit | Attending: Orthopedic Surgery | Admitting: Orthopedic Surgery

## 2023-01-17 ENCOUNTER — Other Ambulatory Visit: Payer: Self-pay | Admitting: Orthopedic Surgery

## 2023-01-17 DIAGNOSIS — M79645 Pain in left finger(s): Secondary | ICD-10-CM | POA: Diagnosis not present

## 2023-01-17 DIAGNOSIS — S66802A Unspecified injury of other specified muscles, fascia and tendons at wrist and hand level, left hand, initial encounter: Secondary | ICD-10-CM

## 2023-01-17 DIAGNOSIS — M79642 Pain in left hand: Secondary | ICD-10-CM | POA: Diagnosis not present

## 2023-01-17 DIAGNOSIS — M7989 Other specified soft tissue disorders: Secondary | ICD-10-CM | POA: Diagnosis not present

## 2023-01-17 DIAGNOSIS — S6992XA Unspecified injury of left wrist, hand and finger(s), initial encounter: Secondary | ICD-10-CM

## 2023-01-17 DIAGNOSIS — R200 Anesthesia of skin: Secondary | ICD-10-CM | POA: Diagnosis not present

## 2023-01-17 DIAGNOSIS — R202 Paresthesia of skin: Secondary | ICD-10-CM | POA: Diagnosis not present

## 2023-01-17 DIAGNOSIS — M25532 Pain in left wrist: Secondary | ICD-10-CM | POA: Diagnosis not present

## 2023-01-17 MED ORDER — IOPAMIDOL (ISOVUE-M 200) INJECTION 41%
1.0000 mL | Freq: Once | INTRAMUSCULAR | Status: AC
  Administered 2023-01-17: 1 mL via INTRA_ARTICULAR

## 2023-01-17 NOTE — Progress Notes (Signed)
Chief Complaint  Patient presents with   Routine Post Op    POV # 1 DOS 12/30/22 --- HAMMERTOE ARTHROPLASTY DIGITS 2,3 LEFT FOOT    Subjective:  Patient presents today status post hammertoe arthroplasty digits 2, 3 of the left foot.  DOS: 12/30/2022.  Patient doing well.  She is WBAT in the cam boot as instructed.  Dressings clean dry and intact.  No pain.  Past Medical History:  Diagnosis Date   Abnormal SPEP 04/17/2014   Acute left ankle pain 01/26/2017   ANEMIA-UNSPECIFIED 09/18/2009   Anxiety    Arthritis    Blood transfusion without reported diagnosis    Cataract    CHF (congestive heart failure) (HCC)    Chronic diastolic heart failure, NYHA class 2 (HCC)    Normal LVEDP by May 2018   COPD (chronic obstructive pulmonary disease) (HCC)    Depression    DIABETES MELLITUS, TYPE II 08/21/2006   Diabetic osteomyelitis (HCC) 05/29/2015   Fatty liver    Fracture of 5th metatarsal    non union   GERD 08/21/2006   Had fundoplication   GOUT 08/20/2010   Hammer toe of right foot    2-5th toes   Hx of umbilical hernia repair    HYPERLIPIDEMIA 08/21/2006   HYPERTENSION 08/21/2006   Infection of wound due to methicillin resistant Staphylococcus aureus (MRSA)    Internal hemorrhoids    Kidney problem    Multiple allergies 10/14/2016   OBESITY 06/04/2009   Onychomycosis 10/27/2015   Osteomyelitis of left foot (HCC) 05/29/2015   Osteoporosis    Pulmonary sarcoidosis (HCC)    Followed locally by pulmonology, but also [facility]   Right knee pain 01/26/2017   Vocal cord dysfunction    Wears partial dentures     Past Surgical History:  Procedure Laterality Date   ABDOMINAL HYSTERECTOMY     APPENDECTOMY     BLADDER SUSPENSION  11/11/2011   Procedure: TRANSVAGINAL TAPE (TVT) PROCEDURE;  Surgeon: Levi AlandMark E Anderson, MD;  Location: [facility];  Service: Gynecology;  Laterality: N/A;   CAROTIDS  02/18/2011   CAROTID DUPLEX; VERTEBRALS ARE PATENT WITH ANTEGRADE  FLOW. ICA/CCA RATIO 1.61 ON RIGHT AND 0.75 ON LEFT   CATARACT EXTRACTION, BILATERAL     CHOLECYSTECTOMY  1984   COLONOSCOPY  04/29/2010   [facility] GoodellPerry   CYSTOSCOPY  11/11/2011   Procedure: CYSTOSCOPY;  Surgeon: Levi AlandMark E Anderson, MD;  Location: [facility];  Service: Gynecology;  Laterality: N/A;   EXTUBATION (ENDOTRACHEAL) IN OR N/A 09/23/2017   Procedure: EXTUBATION (ENDOTRACHEAL) IN OR;  Surgeon: Graylin ShiverMarcellino, Amanda J, MD;  [facility];  Service: ENT;  Laterality: N/A;   FIBEROPTIC LARYNGOSCOPY AND TRACHEOSCOPY N/A 09/23/2017   Procedure: FLEXIBLE FIBEROPTIC LARYNGOSCOPY;  Surgeon: Graylin ShiverMarcellino, Amanda J, MD;  [facility];  Service: ENT;  Laterality: N/A;   FRACTURE SURGERY     foot   HALLUX FUSION Left 10/02/2018   Procedure: HALLUX ARTHRODESIS;  Surgeon: Felecia ShellingEvans, Samyak Sackmann M, DPM;  Location: [facility];  Service: Podiatry;  Laterality: Left;   HAMMER TOE SURGERY Left 10/02/2018   Procedure: HAMMER TOE CORRECTION 2ND 3RD 4RD FIFTH TOE;  Surgeon: Felecia ShellingEvans, Canesha Tesfaye M, DPM;  Location: [facility];  Service: Podiatry;  Laterality: Left;   HAMMER TOE SURGERY Right 04/12/2019   Procedure: HAMMER TOE CORRECTION, 2ND, 3RD, 4TH AND 5TH TOES OF RIGHT FOOT;  Surgeon: Felecia ShellingEvans, Addley Ballinger M, DPM;  [facility];  Service: Podiatry;  Laterality: Right;  HAMMER TOE SURGERY Left 10/25/2019   Procedure: HAMMER TOE REPAIR SECOND, THIRD, FOUTH;  Surgeon: Felecia Shelling, DPM;  [facility];  Service: Podiatry;  Laterality: Left;   HERNIA REPAIR     I & D EXTREMITY Left 06/27/2015   Procedure: Partial Excision Left Calcaneus, Place Antibiotic Beads, and Wound VAC;  Surgeon: Nadara Mustard, MD;  [facility];  Service: Orthopedics;  Laterality: Left;   JOINT REPLACEMENT     KNEE ARTHROSCOPY     right   LEFT AND RIGHT HEART CATHETERIZATION WITH CORONARY ANGIOGRAM N/A 04/23/2013   Procedure: LEFT AND RIGHT HEART CATHETERIZATION WITH CORONARY ANGIOGRAM;  Surgeon: Marykay Lex, MD;  Location: [facility] CATH LAB;  Service: Cardiovascular;   Laterality: N/A;   LEFT HEART CATH AND CORONARY ANGIOGRAPHY N/A 03/11/2017   Procedure: Left Heart Cath and Coronary Angiography;  Surgeon: Tonny Bollman, MD;  Location: [facility] INVASIVE CV LAB; angiographically minimal CAD in the LAD otherwise normal.;  Normal LVEDP.  FALSE POSITIVE MYOVIEW   LEFT HEART CATH AND CORONARY ANGIOGRAPHY  07/20/2010   LVEF 50-55% WITH VERY MILD GLOBAL HYPOKINESIA; ESSENTIALLY NORMAL CORONARY ARTERIES; NORMAL LV FUNCTION   METATARSAL OSTEOTOMY WITH OPEN REDUCTION INTERNAL FIXATION (ORIF) METATARSAL WITH FUSION Left 04/09/2014   Procedure: LEFT FOOT FRACTURE OPEN TREATMENT METATARSAL INCLUDES INTERNAL FIXATION EACH;  Surgeon: Nilda Simmer, MD;  Location: [facility];  Service: Orthopedics;  Laterality: Left;   NISSEN FUNDOPLICATION  2004   NM MYOVIEW LTD --> FALSE POSITIVE  03/10/2017   : Moderate size "stress-induced "perfusion defect at the apex as well as "ill-defined stress-induced perfusion defect in the lateral wall.  EF 55%.  INTERMEDIATE risk. -->  FALSE POSITIVE   ORIF DISTAL FEMUR FRACTURE  08/08/2020   [facility]: ORIF of left extra-articular periprosthetic distal femur fracture with synthesis of the femur plate with cortical nonlocking screws.  (Thought to be pathologic fracture due to osteoporosis) -> after a fall   ORIF FEMUR W/ PERI-IMPLANT  08/29/2020   [facility]) revision of left femur fracture ORIF plate fixation screws; culture positive for MSSA   Right and left CARDIAC CATHETERIZATION  04/23/2013   Angiographic normal coronaries; LVEDP 20 mmHg, PCWP 12-14 mmHg, RAP 12 mmHg.; Fick CO/CI 4.9/2.2   ROTATOR CUFF REPAIR Right 12/09/2021   SHOULDER ARTHROSCOPY Left 03/14/2019   Procedure: LEFT SHOULDER ARTHROSCOPY, DEBRIDEMENT, AND DECOMPRESSION;  Surgeon: Nadara Mustard, MD;  [facility];  Service: Orthopedics;  Laterality: Left;   TOTAL KNEE ARTHROPLASTY Right 06/29/2017   Procedure: RIGHT TOTAL KNEE  ARTHROPLASTY;  Surgeon: Nadara Mustard, MD;  Location[facility] OR;  Service: Orthopedics;  Laterality: Right;   TOTAL KNEE ARTHROPLASTY Left 12/07/2017   TOTAL KNEE ARTHROPLASTY Left 12/07/2017   Procedure: LEFT TOTAL KNEE ARTHROPLASTY;  Surgeon: Nadara Mustard, MD;  Location: [facility] OR;  Service: Orthopedics;  Laterality: Left;   TRACHEOSTOMY TUBE PLACEMENT N/A 09/20/2017   Procedure: AWAKE INTUBATION WITH ANESTHESIA WITH VIDEO ASSISTANCE;  Surgeon: Graylin Shiver, MD;  [facility];  Service: ENT;  Laterality: N/A;   TRANSTHORACIC ECHOCARDIOGRAM  08/2014   Normal LV size and function.  Mild LVH.  EF 55-60%.  Normal regional wall motion.  GR 1 DD.  Normal RV size and function .   TRANSTHORACIC ECHOCARDIOGRAM  08/12/2020    [facility]): Normal LV size and function.  EF 55 to 60%.  No RWM A.  Normal filling pattern.  Normal RV.  No significant valvular disease-stenosis or regurgitation.  No vegetation.   TUBAL LIGATION     with reversal in 1994   UPPER GASTROINTESTINAL ENDOSCOPY     VENTRAL HERNIA REPAIR      Allergies  Allergen Reactions   Firvanq [Vancomycin] Other (See Comments)    Dose related nephrotoxicity    Trexall [Methotrexate] Other (See Comments)    Peri-oral and buccal lesions   Zestril [Lisinopril] Cough   Chlorhexidine Itching   Cleocin [Clindamycin] Nausea And Vomiting and Rash   Lincocin [Lincomycin Hcl] Nausea And Vomiting and Rash   Teflaro [Ceftaroline] Rash and Other (See Comments)    Tolerates ceftriaxone     Objective/Physical Exam Neurovascular status intact.  Incision well coapted with sutures intact. No sign of infectious process noted. No dehiscence. No active bleeding noted.  Moderate edema noted to the surgical extremity.  Good rectus alignment of the toes with percutaneous fixation pins intact  Radiographic Exam LT foot 01/05/2023:  Orthopedic hardware noted throughout the foot which is stable and intact.  Percutaneous fixation pins to the  second and third digits stable in good alignment.  There does appear to be irregularity/possible fracture of the proximal phalanx of the second toe.  This is also visible on preoperative x-rays.  Overall stability of the foot noted  Assessment: 1. s/p hammertoe arthroplasty digits 2, 3 left foot. DOS: 12/30/2022   Plan of Care:  1. Patient was evaluated. X-rays reviewed 2.  Dressings changed.  Clean dry and intact x 1 week 3.  Continue WBAT cam boot 4.  Return to clinic 1 week suture removal   Felecia Shelling, DPM [facility]  Dr. Felecia Shelling, DPM    [address] [place], [place] [address]                Office [phone]  Fax [phone]

## 2023-01-21 ENCOUNTER — Encounter: Payer: Self-pay | Admitting: Family Medicine

## 2023-01-21 DIAGNOSIS — M25532 Pain in left wrist: Secondary | ICD-10-CM | POA: Diagnosis not present

## 2023-01-26 ENCOUNTER — Ambulatory Visit (INDEPENDENT_AMBULATORY_CARE_PROVIDER_SITE_OTHER): Payer: BC Managed Care – PPO | Admitting: Podiatry

## 2023-01-26 ENCOUNTER — Ambulatory Visit (INDEPENDENT_AMBULATORY_CARE_PROVIDER_SITE_OTHER): Payer: Medicare Other

## 2023-01-26 DIAGNOSIS — M2041 Other hammer toe(s) (acquired), right foot: Secondary | ICD-10-CM

## 2023-01-26 DIAGNOSIS — M2042 Other hammer toe(s) (acquired), left foot: Secondary | ICD-10-CM | POA: Diagnosis not present

## 2023-01-26 DIAGNOSIS — Z9889 Other specified postprocedural states: Secondary | ICD-10-CM

## 2023-01-26 NOTE — Progress Notes (Signed)
Chief Complaint  Patient presents with   Routine Post Op    POV # 3 DOS 12/30/22 --- HAMMERTOE ARTHROPLASTY DIGITS 2,3 LEFT FOOT, NO N/V/F/C/SOB, patient denies any pain,  X-Rays done today TX: cam boot        Subjective:  Patient presents today status post hammertoe arthroplasty of the second and third digit left foot.  DOS: 12/30/2022.  Patient doing well.  No pain.  She is WBAT in the cam boot.  No new complaints at this time  Past Medical History:  Diagnosis Date   Abnormal SPEP 04/17/2014   Acute left ankle pain 01/26/2017   ANEMIA-UNSPECIFIED 09/18/2009   Anxiety    Arthritis    Blood transfusion without reported diagnosis    Cataract    CHF (congestive heart failure) (HCC)    Chronic diastolic heart failure, NYHA class 2 (HCC)    Normal LVEDP by May 2018   COPD (chronic obstructive pulmonary disease) (HCC)    Depression    DIABETES MELLITUS, TYPE II 08/21/2006   Diabetic osteomyelitis (HCC) 05/29/2015   Fatty liver    Fracture of 5th metatarsal    non union   GERD 08/21/2006   Had fundoplication   GOUT 08/20/2010   Hammer toe of right foot    2-5th toes   Hx of umbilical hernia repair    HYPERLIPIDEMIA 08/21/2006   HYPERTENSION 08/21/2006   Infection of wound due to methicillin resistant Staphylococcus aureus (MRSA)    Internal hemorrhoids    Kidney problem    Multiple allergies 10/14/2016   OBESITY 06/04/2009   Onychomycosis 10/27/2015   Osteomyelitis of left foot (HCC) 05/29/2015   Osteoporosis    Pulmonary sarcoidosis (HCC)    Followed locally by pulmonology, but also by Dr. Sandy Salaam at [facility]   Right knee pain 01/26/2017   Vocal cord dysfunction    Wears partial dentures     Past Surgical History:  Procedure Laterality Date   ABDOMINAL HYSTERECTOMY     APPENDECTOMY     BLADDER SUSPENSION  11/11/2011   Procedure: TRANSVAGINAL TAPE (TVT) PROCEDURE;  Surgeon: Levi Aland, MD;  Location: [facility];  Service: Gynecology;  Laterality: N/A;    CAROTIDS  02/18/2011   CAROTID DUPLEX; VERTEBRALS ARE PATENT WITH ANTEGRADE FLOW. ICA/CCA RATIO 1.61 ON RIGHT AND 0.75 ON LEFT   CATARACT EXTRACTION, BILATERAL     CHOLECYSTECTOMY  1984   COLONOSCOPY  04/29/2010   Marina Goodell   CYSTOSCOPY  11/11/2011   Procedure: CYSTOSCOPY;  Surgeon: Levi Aland, MD;  Location: [facility];  Service: Gynecology;  Laterality: N/A;   EXTUBATION (ENDOTRACHEAL) IN OR N/A 09/23/2017   Procedure: EXTUBATION (ENDOTRACHEAL) IN OR;  Surgeon: Graylin Shiver, MD;  [facility];  Service: ENT;  Laterality: N/A;   FIBEROPTIC LARYNGOSCOPY AND TRACHEOSCOPY N/A 09/23/2017   Procedure: FLEXIBLE FIBEROPTIC LARYNGOSCOPY;  Surgeon: Graylin Shiver, MD;  [facility];  Service: ENT;  Laterality: N/A;   FRACTURE SURGERY     foot   HALLUX FUSION Left 10/02/2018   Procedure: HALLUX ARTHRODESIS;  Surgeon: Felecia Shelling, DPM;  Location: [facility];  Service: Podiatry;  Laterality: Left;   HAMMER TOE SURGERY Left 10/02/2018   Procedure: HAMMER TOE CORRECTION 2ND 3RD 4RD FIFTH TOE;  Surgeon: Felecia Shelling, DPM;  Location: [facility];  Service: Podiatry;  Laterality: Left;   HAMMER TOE SURGERY Right 04/12/2019   Procedure: HAMMER TOE CORRECTION, 2ND, 3RD, 4TH AND 5TH TOES OF RIGHT FOOT;  Surgeon: Felecia Shelling, DPM;  [facility];  Service: Podiatry;  Laterality: Right;   HAMMER TOE SURGERY Left 10/25/2019   Procedure: HAMMER TOE REPAIR SECOND, THIRD, FOUTH;  Surgeon: Felecia Shelling, DPM;  [facility];  Service: Podiatry;  Laterality: Left;   HERNIA REPAIR     I & D EXTREMITY Left 06/27/2015   Procedure: Partial Excision Left Calcaneus, Place Antibiotic Beads, and Wound VAC;  Surgeon: Nadara Mustard, MD;  [facility];  Service: Orthopedics;  Laterality: Left;   JOINT REPLACEMENT     KNEE ARTHROSCOPY     right   LEFT AND RIGHT HEART CATHETERIZATION WITH CORONARY ANGIOGRAM N/A 04/23/2013   Procedure: LEFT AND RIGHT HEART CATHETERIZATION WITH CORONARY ANGIOGRAM;   Surgeon: Marykay Lex, MD;  Location: [facility] CATH LAB;  Service: Cardiovascular;  Laterality: N/A;   LEFT HEART CATH AND CORONARY ANGIOGRAPHY N/A 03/11/2017   Procedure: Left Heart Cath and Coronary Angiography;  Surgeon: Tonny Bollman, MD;  Location: [facility] INVASIVE CV LAB; angiographically minimal CAD in the LAD otherwise normal.;  Normal LVEDP.  FALSE POSITIVE MYOVIEW   LEFT HEART CATH AND CORONARY ANGIOGRAPHY  07/20/2010   LVEF 50-55% WITH VERY MILD GLOBAL HYPOKINESIA; ESSENTIALLY NORMAL CORONARY ARTERIES; NORMAL LV FUNCTION   METATARSAL OSTEOTOMY WITH OPEN REDUCTION INTERNAL FIXATION (ORIF) METATARSAL WITH FUSION Left 04/09/2014   Procedure: LEFT FOOT FRACTURE OPEN TREATMENT METATARSAL INCLUDES INTERNAL FIXATION EACH;  Surgeon: Nilda Simmer, MD;  Location: [facility];  Service: Orthopedics;  Laterality: Left;   NISSEN FUNDOPLICATION  2004   NM MYOVIEW LTD --> FALSE POSITIVE  03/10/2017   : Moderate size "stress-induced "perfusion defect at the apex as well as "ill-defined stress-induced perfusion defect in the lateral wall.  EF 55%.  INTERMEDIATE risk. -->  FALSE POSITIVE   ORIF DISTAL FEMUR FRACTURE  08/08/2020   [facility]: ORIF of left extra-articular periprosthetic distal femur fracture with synthesis of the femur plate with cortical nonlocking screws.  (Thought to be pathologic fracture due to osteoporosis) -> after a fall   ORIF FEMUR W/ PERI-IMPLANT  08/29/2020   [facility]) revision of left femur fracture ORIF plate fixation screws; culture positive for MSSA   Right and left CARDIAC CATHETERIZATION  04/23/2013   Angiographic normal coronaries; LVEDP 20 mmHg, PCWP 12-14 mmHg, RAP 12 mmHg.; Fick CO/CI 4.9/2.2   ROTATOR CUFF REPAIR Right 12/09/2021   SHOULDER ARTHROSCOPY Left 03/14/2019   Procedure: LEFT SHOULDER ARTHROSCOPY, DEBRIDEMENT, AND DECOMPRESSION;  Surgeon: Nadara Mustard, MD;  [facility];  Service: Orthopedics;  Laterality: Left;    TOTAL KNEE ARTHROPLASTY Right 06/29/2017   Procedure: RIGHT TOTAL KNEE ARTHROPLASTY;  Surgeon: Nadara Mustard, MD;  Location: [facility] OR;  Service: Orthopedics;  Laterality: Right;   TOTAL KNEE ARTHROPLASTY Left 12/07/2017   TOTAL KNEE ARTHROPLASTY Left 12/07/2017   Procedure: LEFT TOTAL KNEE ARTHROPLASTY;  Surgeon: Nadara Mustard, MD;  Location: [facility] OR;  Service: Orthopedics;  Laterality: Left;   TRACHEOSTOMY TUBE PLACEMENT N/A 09/20/2017   Procedure: AWAKE INTUBATION WITH ANESTHESIA WITH VIDEO ASSISTANCE;  Surgeon: Graylin Shiver, MD;  [facility];  Service: ENT;  Laterality: N/A;   TRANSTHORACIC ECHOCARDIOGRAM  08/2014   Normal LV size and function.  Mild LVH.  EF 55-60%.  Normal regional wall motion.  GR 1 DD.  Normal RV size and function .   TRANSTHORACIC ECHOCARDIOGRAM  08/12/2020    [facility]): Normal LV size and  function.  EF 55 to 60%.  No RWM A.  Normal filling pattern.  Normal RV.  No significant valvular disease-stenosis or regurgitation.  No vegetation.   TUBAL LIGATION     with reversal in 1994   UPPER GASTROINTESTINAL ENDOSCOPY     VENTRAL HERNIA REPAIR      Allergies  Allergen Reactions   Firvanq [Vancomycin] Other (See Comments)    Dose related nephrotoxicity    Trexall [Methotrexate] Other (See Comments)    Peri-oral and buccal lesions   Zestril [Lisinopril] Cough   Chlorhexidine Itching   Cleocin [Clindamycin] Nausea And Vomiting and Rash   Lincocin [Lincomycin Hcl] Nausea And Vomiting and Rash   Teflaro [Ceftaroline] Rash and Other (See Comments)    Tolerates ceftriaxone     Objective/Physical Exam Neurovascular status intact.  Skin incisions nicely healed.  There is some residual mild edema which should resolve over time.  No erythema or concern for infection.  Percutaneous fixation pins intact.  Toes in good rectus alignment  Radiographic exam LT foot 01/26/2023 Good alignment of the toes.  No appreciable change since last x-rays  taken.  Assessment: 1. s/p second and third digit left. DOS: 12/30/2022  -Patient evaluated -Percutaneous fixation pins removed today -Discontinue cam boot.  Patient may now transition into good supportive tennis shoes and sneakers -Slowly increase to full activity no restrictions -Return to clinic as needed  Felecia Shelling, DPM [facility]  Dr. Felecia Shelling, DPM    [address] [place], [place] [address]                Office [phone]  Fax [phone]

## 2023-01-29 ENCOUNTER — Other Ambulatory Visit: Payer: Self-pay | Admitting: Family Medicine

## 2023-01-29 ENCOUNTER — Other Ambulatory Visit: Payer: Self-pay | Admitting: Cardiology

## 2023-02-06 ENCOUNTER — Other Ambulatory Visit: Payer: Self-pay | Admitting: Cardiology

## 2023-02-06 IMAGING — DX DG HAND COMPLETE 3+V*L*
3 series · 3 of 3 positions shown · non-contrast
Comparison: None Available.

CLINICAL DATA: Status post fall 3 days ago with subsequent left
hand pain.

EXAM:
LEFT HAND - COMPLETE 3+ VIEW

[hand ap]
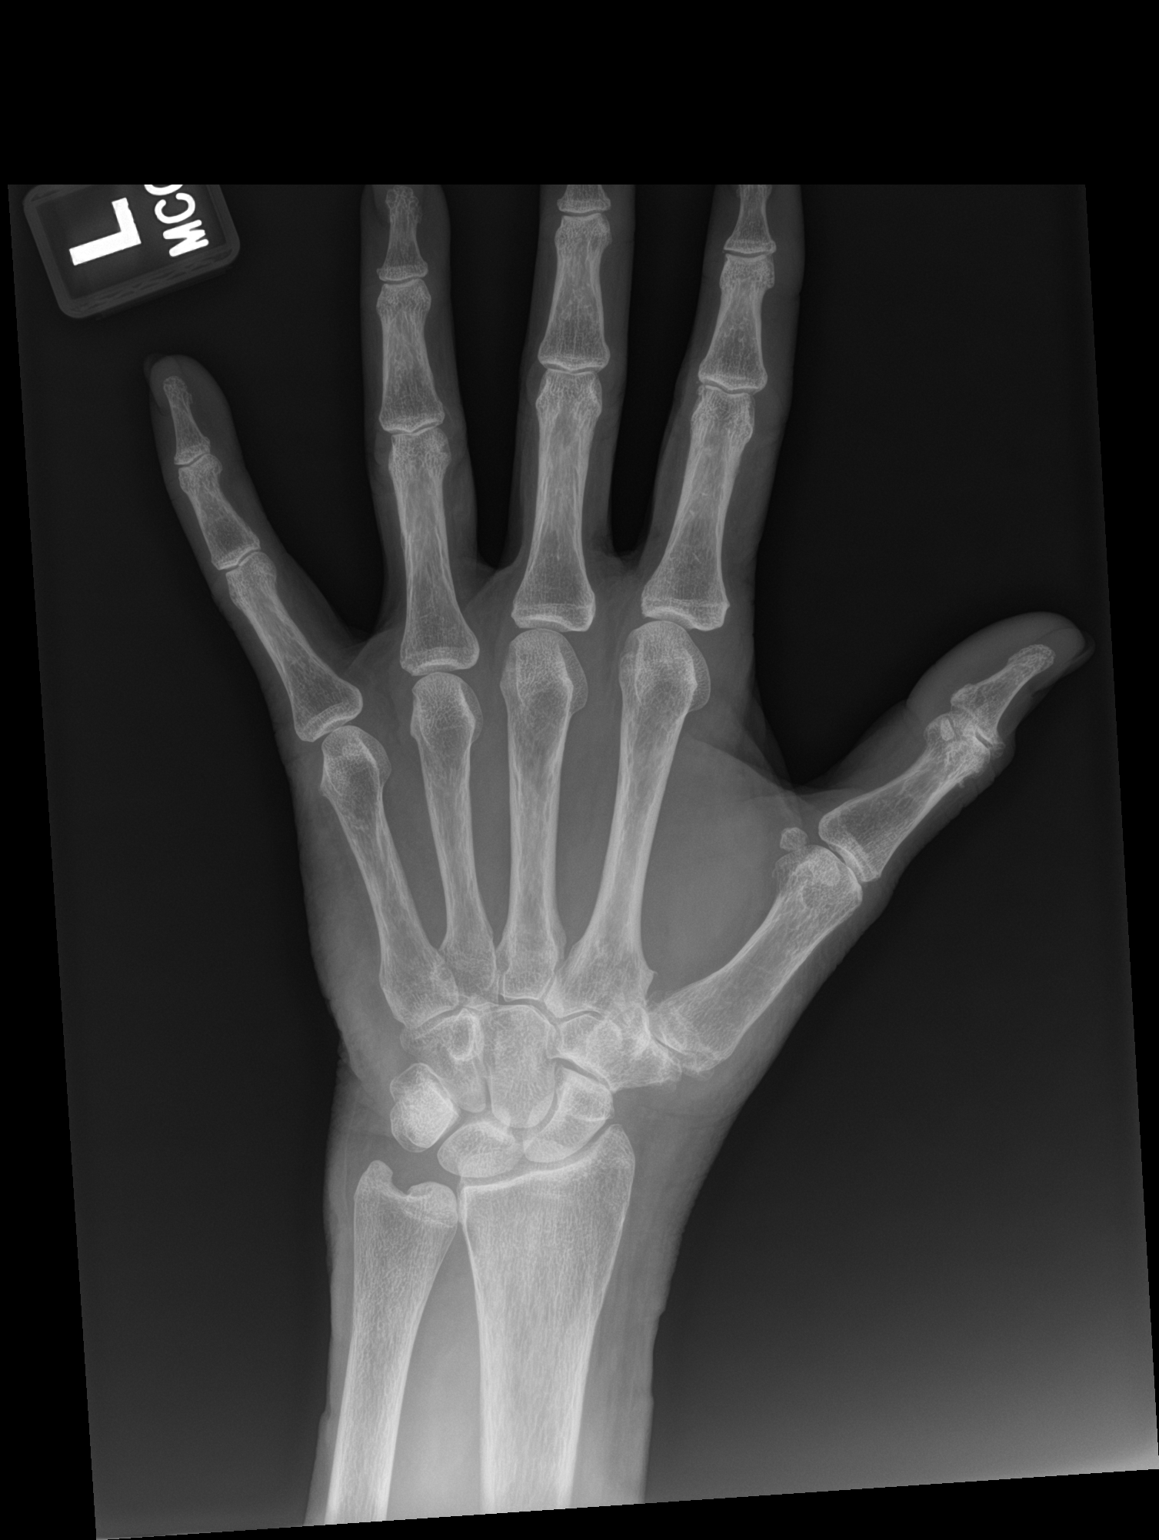

[hand obl]
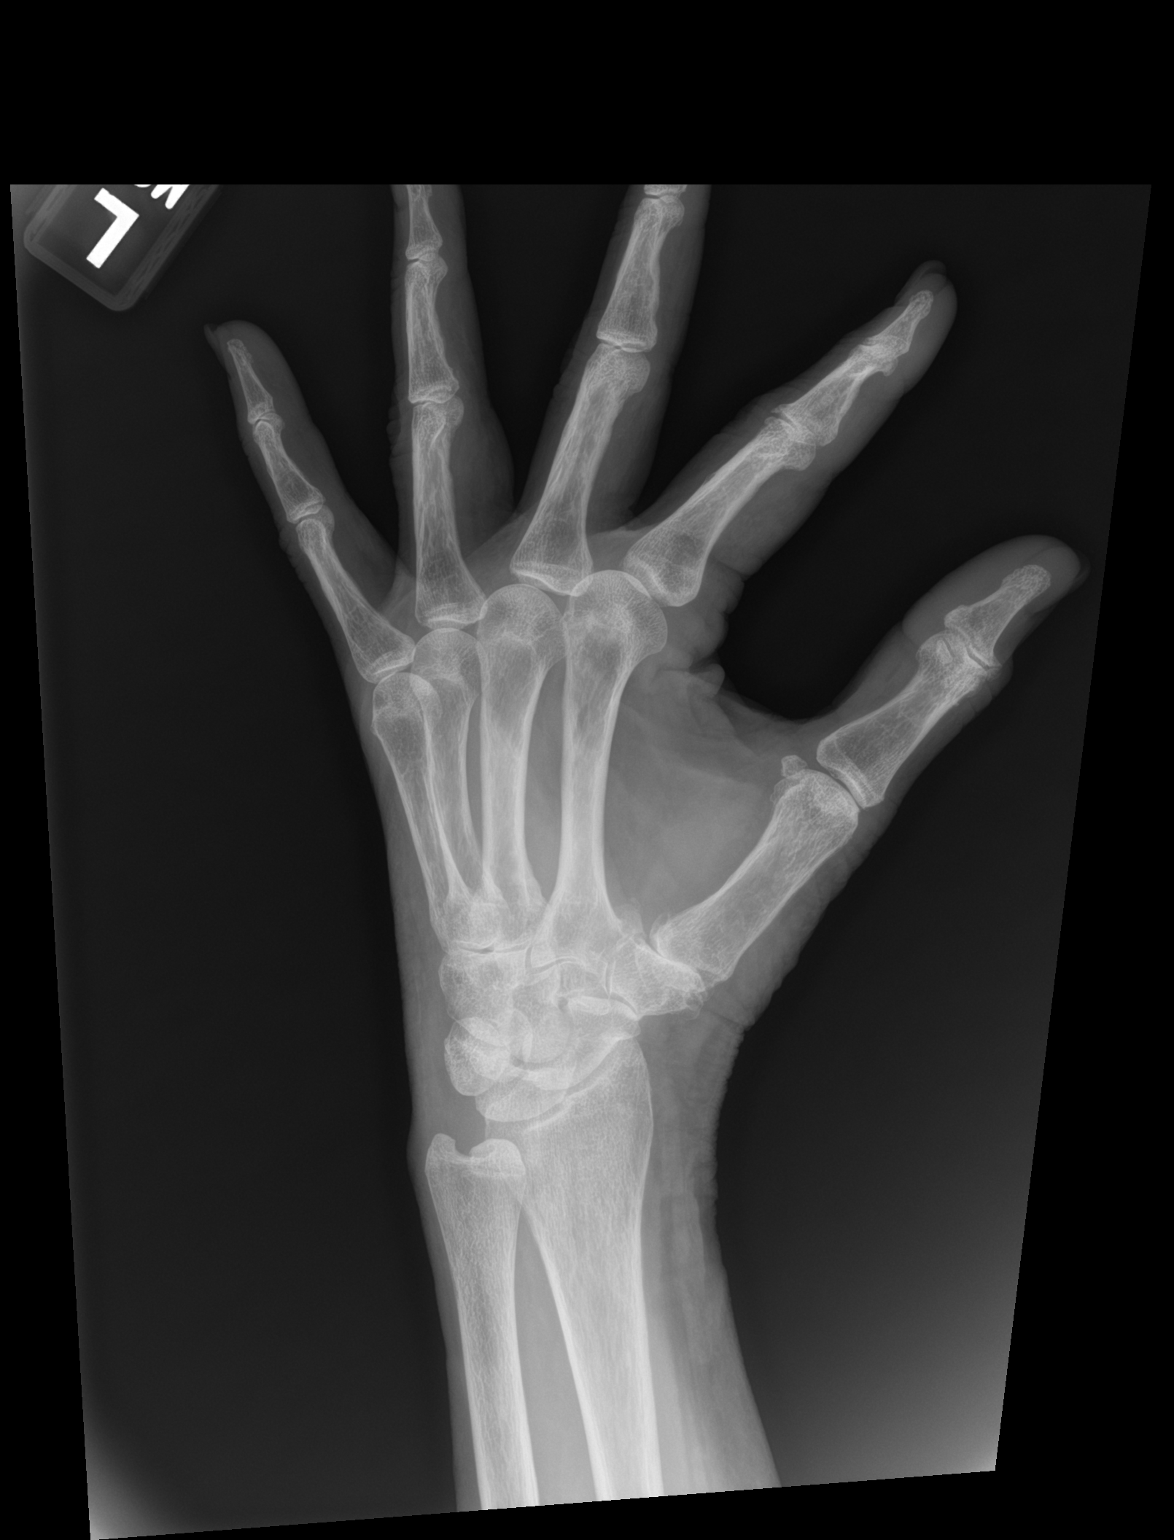

[hand lat]
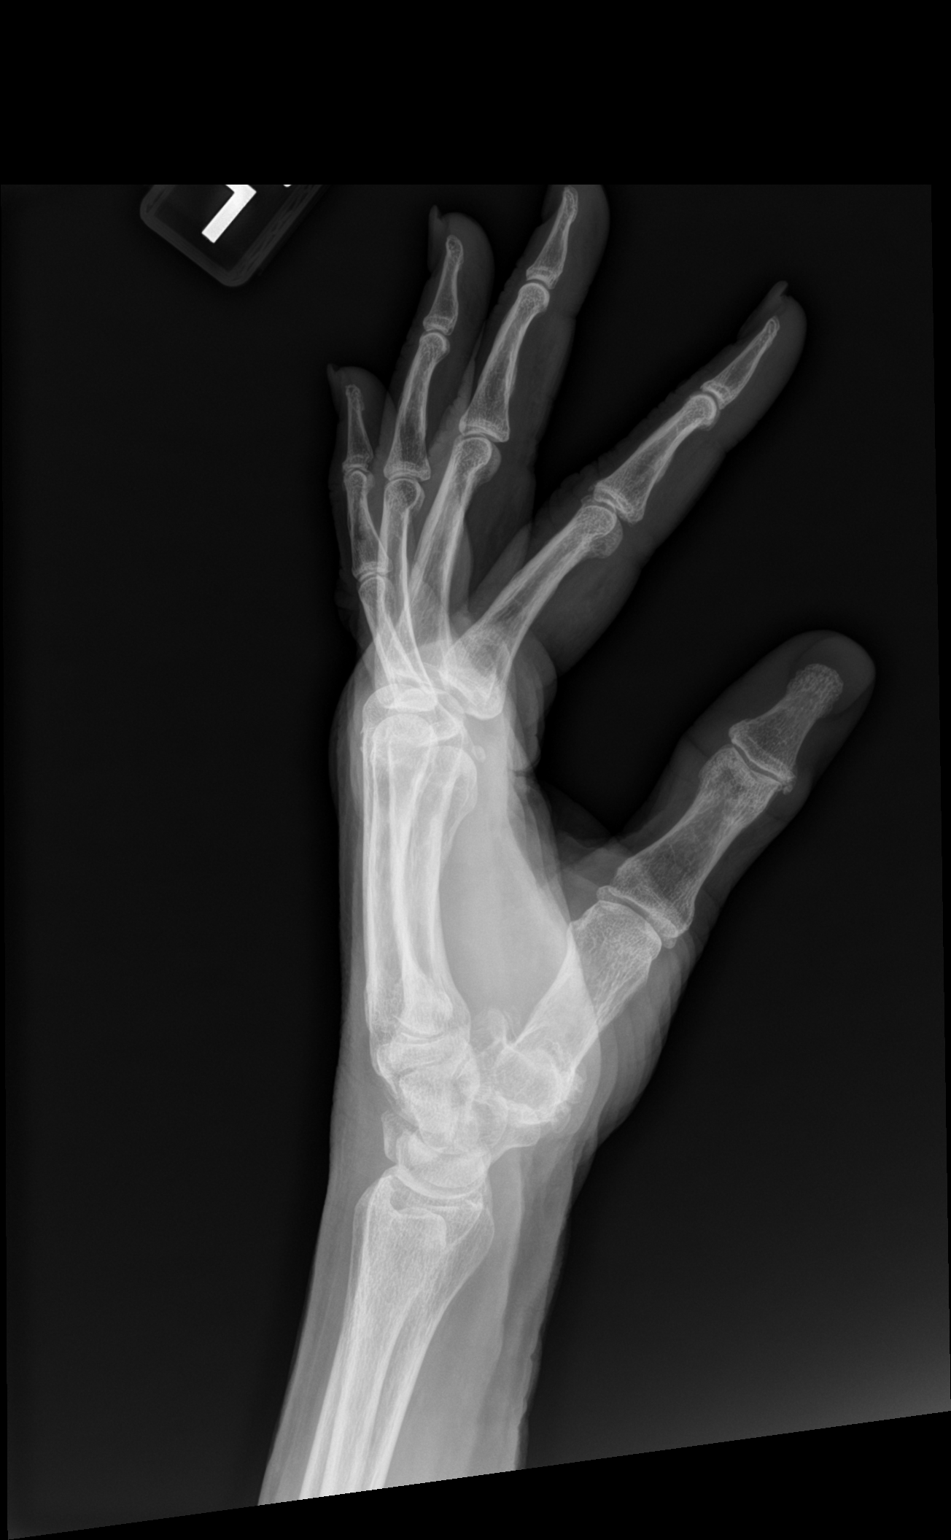

[3 of 3 positions shown; findings below may reference images not displayed]

FINDINGS: There is no evidence of fracture or dislocation. Degenerative
changes are seen involving the carpometacarpal articulation of the
left thumb. Soft tissues are unremarkable.
IMPRESSION: No acute fracture or dislocation.

## 2023-02-07 ENCOUNTER — Other Ambulatory Visit: Payer: Self-pay | Admitting: Podiatry

## 2023-02-07 DIAGNOSIS — M2041 Other hammer toe(s) (acquired), right foot: Secondary | ICD-10-CM

## 2023-02-07 DIAGNOSIS — Z9889 Other specified postprocedural states: Secondary | ICD-10-CM

## 2023-02-08 NOTE — Progress Notes (Signed)
Cardiology Office Note:    Date:  02/17/2023   ID:  [name], DOB 1961/01/09, MRN [id number]  PCP:  Shelva Majestic, MD  Cardiologist:  Bryan Lemma, MD  Electrophysiologist:  None   Referring MD: Shelva Majestic, MD   Chief Complaint: routine follow-up for CHF  History of Present Illness:    [name] is a 62 y.o. female with a history of minimal CAD on cardiac catheterization in 03/2017, chronic diastolic CHF, pulmonary sarcoidosis, COPD, hypertension, hyperlipidemia, type 2 diabetes mellitus, GERD, fatty liver disease, and vocal cord dysfunction who is followed by Dr. Herbie Baltimore and presents today for routine follow-up of CHF.  Patient is primarily followed by Dr. Herbie Baltimore for chronic diastolic CHF. Echos in 2012 and again in 2015 showed normal LV function and grade 1 diastolic dysfunction. She has intermittent chest pain over the years and has undergone multiple stress tests. Last ischemic evaluation was a left cardiac catheterization in 03/2017 which showed only minimal non-obstructive irregularities of the LAD but otherwise normal coronaries. LVEDP was normal. Monitor in 07/2018 for further evaluation of rapid palpitations  showed underlying sinus rhythm with occasional PVCs but no significant arrhythmias.   She was last seen by Dr. Herbie Baltimore in 11/2021 at which time she continued to have her baseline dyspnea which was stable and was otherwise doing well from a cardiac standpoint.  Since last office visit, she was admitted in 05/2022 for COPD exacerbation. Echo during this admission showed LVEF of 55-60% with normal wall motion and grade 1 diastolic dysfunction. She was recently admitted again from 11/09/2022 to 11/12/2022 for acute hypoxic respiratory failure secondary to COPD exacerbation and influenza pneumonia.  Patient presents today for follow-up. Here alone. Patient is doing well from a cardiac standpoint. She denies any chest pain. She has chronic dyspnea with exertion  if she walks longer distances due to her COPD and pulmonary sarcoidosis but this is stable. No shortness of breath at rest or ambulating around her house. No orthopnea, PND, edema, palpitations, lightheadedness, dizziness, or syncope. Her only complaint is some varicose veins that are sometimes tender to the touch. She also reports occasional leg cramps at night but this occurs rarely.   Past Medical History:  Diagnosis Date   Abnormal SPEP 04/17/2014   Acute left ankle pain 01/26/2017   ANEMIA-UNSPECIFIED 09/18/2009   Anxiety    Arthritis    Blood transfusion without reported diagnosis    Cataract    CHF (congestive heart failure) (HCC)    Chronic diastolic heart failure, NYHA class 2 (HCC)    Normal LVEDP by May 2018   COPD (chronic obstructive pulmonary disease) (HCC)    Depression    DIABETES MELLITUS, TYPE II 08/21/2006   Diabetic osteomyelitis (HCC) 05/29/2015   Fatty liver    Fracture of 5th metatarsal    non union   GERD 08/21/2006   Had fundoplication   GOUT 08/20/2010   Hammer toe of right foot    2-5th toes   Hx of umbilical hernia repair    HYPERLIPIDEMIA 08/21/2006   HYPERTENSION 08/21/2006   Infection of wound due to methicillin resistant Staphylococcus aureus (MRSA)    Internal hemorrhoids    Kidney problem    Multiple allergies 10/14/2016   OBESITY 06/04/2009   Onychomycosis 10/27/2015   Osteomyelitis of left foot (HCC) 05/29/2015   Osteoporosis    Pulmonary sarcoidosis (HCC)    Followed locally by pulmonology, but also by Dr. Sandy Salaam at [facility]  Right knee pain 01/26/2017   Vocal cord dysfunction    Wears partial dentures     Past Surgical History:  Procedure Laterality Date   ABDOMINAL HYSTERECTOMY     APPENDECTOMY     BLADDER SUSPENSION  11/11/2011   Procedure: TRANSVAGINAL TAPE (TVT) PROCEDURE;  Surgeon: Levi Aland, MD;  Location: [facility];  Service: Gynecology;  Laterality: N/A;   CAROTIDS  02/18/2011   CAROTID DUPLEX; VERTEBRALS ARE  PATENT WITH ANTEGRADE FLOW. ICA/CCA RATIO 1.61 ON RIGHT AND 0.75 ON LEFT   CATARACT EXTRACTION, BILATERAL     CHOLECYSTECTOMY  1984   COLONOSCOPY  04/29/2010   Marina Goodell   CYSTOSCOPY  11/11/2011   Procedure: CYSTOSCOPY;  Surgeon: Levi Aland, MD;  Location: [facility];  Service: Gynecology;  Laterality: N/A;   EXTUBATION (ENDOTRACHEAL) IN OR N/A 09/23/2017   Procedure: EXTUBATION (ENDOTRACHEAL) IN OR;  Surgeon: Graylin Shiver, MD;  Location: [facility];  Service: ENT;  Laterality: N/A;   FIBEROPTIC LARYNGOSCOPY AND TRACHEOSCOPY N/A 09/23/2017   Procedure: FLEXIBLE FIBEROPTIC LARYNGOSCOPY;  Surgeon: Graylin Shiver, MD;  Location: [facility];  Service: ENT;  Laterality: N/A;   FRACTURE SURGERY     foot   HALLUX FUSION Left 10/02/2018   Procedure: HALLUX ARTHRODESIS;  Surgeon: Felecia Shelling, DPM;  Location: [facility];  Service: Podiatry;  Laterality: Left;   HAMMER TOE SURGERY Left 10/02/2018   Procedure: HAMMER TOE CORRECTION 2ND 3RD 4RD FIFTH TOE;  Surgeon: Felecia Shelling, DPM;  Location: [facility];  Service: Podiatry;  Laterality: Left;   HAMMER TOE SURGERY Right 04/12/2019   Procedure: HAMMER TOE CORRECTION, 2ND, 3RD, 4TH AND 5TH TOES OF RIGHT FOOT;  Surgeon: Felecia Shelling, DPM;  Location: [facility];  Service: Podiatry;  Laterality: Right;   HAMMER TOE SURGERY Left 10/25/2019   Procedure: HAMMER TOE REPAIR SECOND, THIRD, FOUTH;  Surgeon: Felecia Shelling, DPM;  Location: [facility];  Service: Podiatry;  Laterality: Left;   HERNIA REPAIR     I & D EXTREMITY Left 06/27/2015   Procedure: Partial Excision Left Calcaneus, Place Antibiotic Beads, and Wound VAC;  Surgeon: Nadara Mustard, MD;  Location: [facility];  Service: Orthopedics;  Laterality: Left;   JOINT REPLACEMENT     KNEE ARTHROSCOPY     right   LEFT AND RIGHT HEART CATHETERIZATION WITH CORONARY ANGIOGRAM N/A 04/23/2013   Procedure: LEFT AND RIGHT HEART CATHETERIZATION WITH CORONARY ANGIOGRAM;  Surgeon: Marykay Lex, MD;  Location: [facility] CATH LAB;   Service: Cardiovascular;  Laterality: N/A;   LEFT HEART CATH AND CORONARY ANGIOGRAPHY N/A 03/11/2017   Procedure: Left Heart Cath and Coronary Angiography;  Surgeon: Tonny Bollman, MD;  Location: [facility] INVASIVE CV LAB; angiographically minimal CAD in the LAD otherwise normal.;  Normal LVEDP.  FALSE POSITIVE MYOVIEW   LEFT HEART CATH AND CORONARY ANGIOGRAPHY  07/20/2010   LVEF 50-55% WITH VERY MILD GLOBAL HYPOKINESIA; ESSENTIALLY NORMAL CORONARY ARTERIES; NORMAL LV FUNCTION   METATARSAL OSTEOTOMY WITH OPEN REDUCTION INTERNAL FIXATION (ORIF) METATARSAL WITH FUSION Left 04/09/2014   Procedure: LEFT FOOT FRACTURE OPEN TREATMENT METATARSAL INCLUDES INTERNAL FIXATION EACH;  Surgeon: Nilda Simmer, MD;  Location: [facility];  Service: Orthopedics;  Laterality: Left;   NISSEN FUNDOPLICATION  2004   NM MYOVIEW LTD --> FALSE POSITIVE  03/10/2017   : Moderate size "stress-induced "perfusion defect at the apex as well as "ill-defined stress-induced perfusion defect in the lateral wall.  EF 55%.  INTERMEDIATE risk. -->  FALSE  POSITIVE   ORIF DISTAL FEMUR FRACTURE  08/08/2020   [facility]: ORIF of left extra-articular periprosthetic distal femur fracture with synthesis of the femur plate with cortical nonlocking screws.  (Thought to be pathologic fracture due to osteoporosis) -> after a fall   ORIF FEMUR W/ PERI-IMPLANT  08/29/2020   [facility]) revision of left femur fracture ORIF plate fixation screws; culture positive for MSSA   Right and left CARDIAC CATHETERIZATION  04/23/2013   Angiographic normal coronaries; LVEDP 20 mmHg, PCWP 12-14 mmHg, RAP 12 mmHg.; Fick CO/CI 4.9/2.2   ROTATOR CUFF REPAIR Right 12/09/2021   SHOULDER ARTHROSCOPY Left 03/14/2019   Procedure: LEFT SHOULDER ARTHROSCOPY, DEBRIDEMENT, AND DECOMPRESSION;  Surgeon: Nadara Mustard, MD;  Location: [facility];  Service: Orthopedics;  Laterality: Left;   TOTAL KNEE ARTHROPLASTY Right 06/29/2017   Procedure:  RIGHT TOTAL KNEE ARTHROPLASTY;  Surgeon: Nadara Mustard, MD;  Location[facility] OR;  Service: Orthopedics;  Laterality: Right;   TOTAL KNEE ARTHROPLASTY Left 12/07/2017   TOTAL KNEE ARTHROPLASTY Left 12/07/2017   Procedure: LEFT TOTAL KNEE ARTHROPLASTY;  Surgeon: Nadara Mustard, MD;  Location: [facility] OR;  Service: Orthopedics;  Laterality: Left;   TRACHEOSTOMY TUBE PLACEMENT N/A 09/20/2017   Procedure: AWAKE INTUBATION WITH ANESTHESIA WITH VIDEO ASSISTANCE;  Surgeon: Graylin Shiver, MD;  Location: [facility];  Service: ENT;  Laterality: N/A;   TRANSTHORACIC ECHOCARDIOGRAM  08/2014   Normal LV size and function.  Mild LVH.  EF 55-60%.  Normal regional wall motion.  GR 1 DD.  Normal RV size and function .   TRANSTHORACIC ECHOCARDIOGRAM  08/12/2020    [facility]): Normal LV size and function.  EF 55 to 60%.  No RWM A.  Normal filling pattern.  Normal RV.  No significant valvular disease-stenosis or regurgitation.  No vegetation.   TUBAL LIGATION     with reversal in 1994   UPPER GASTROINTESTINAL ENDOSCOPY     VENTRAL HERNIA REPAIR      Current Medications: Current Meds  Medication Sig   albuterol (PROVENTIL) (2.5 MG/3ML) 0.083% nebulizer solution Take 2.5 mg by nebulization every 6 (six) hours as needed for wheezing or shortness of breath.   allopurinol (ZYLOPRIM) 300 MG tablet Take 1 tablet (300 mg total) by mouth daily.   aspirin EC 81 MG tablet Take 1 tablet (81 mg total) by mouth daily.   atorvastatin (LIPITOR) 40 MG tablet TAKE 1 TABLET BY MOUTH EVERY DAY (Patient taking differently: Take 40 mg by mouth daily.)   benzonatate (TESSALON PERLES) 100 MG capsule Take 1-2 capsules (100-200 mg total) by mouth 3 (three) times daily as needed for cough.   BREO ELLIPTA 200-25 MCG/INH AEPB TAKE 1 PUFF BY MOUTH EVERY DAY (Patient taking differently: Inhale 1 puff into the lungs daily.)   buPROPion (WELLBUTRIN XL) 300 MG 24 hr tablet TAKE 1 TABLET BY MOUTH EVERY DAY   chlorpheniramine-HYDROcodone  (TUSSIONEX) 10-8 MG/5ML Take 5 mLs by mouth at bedtime as needed for cough (do not drive for 8 hours after taking).   Continuous Blood Gluc Sensor (DEXCOM G7 SENSOR) MISC 1 Device by Does not apply route as directed. Change sensor every 10 days   cyclobenzaprine (FLEXERIL) 5 MG tablet Take 1 tablet (5 mg total) by mouth 3 (three) times daily as needed for muscle spasms (or Headache). (Patient taking differently: Take 5 mg by mouth 3 (three) times daily as needed for muscle spasms (headache).)   Dulaglutide (TRULICITY) 4.5 MG/0.5ML SOPN  Inject 4.5 mg as directed once a week. (Patient taking differently: Inject 4.5 mg as directed every Monday.)   estradiol (ESTRACE) 0.1 MG/GM vaginal cream Place 0.5 Applicatorfuls vaginally 2 (two) times a week.   fenofibrate 160 MG tablet TAKE 1 TABLET BY MOUTH EVERY DAY   fluticasone (FLONASE) 50 MCG/ACT nasal spray Place 2 sprays into both nostrils daily as needed for allergies. (Patient taking differently: Place 1 spray into both nostrils daily.)   furosemide (LASIX) 40 MG tablet Take 1 tablet (40 mg total) by mouth daily.   gabapentin (NEURONTIN) 300 MG capsule Take at night in addition to the 100mg  gabapentin to get 400mg  at night and 100mg  in the morning and at mid day. (Patient taking differently: Take 300 mg by mouth at bedtime as needed (pain, neuropathy).)   glucose blood (CONTOUR NEXT TEST) test strip 1 each by Other route 4 (four) times daily. And lancets 4/day   ibuprofen (ADVIL) 800 MG tablet Take 1 tablet (800 mg total) by mouth 3 (three) times daily.   insulin aspart (NOVOLOG FLEXPEN) 100 UNIT/ML FlexPen Inject 15 Units into the skin daily with supper. (Patient taking differently: Inject 8-14 Units into the skin See admin instructions. Inject 8 units before breakfast, 10 units before lunch, 14 units before supper.)   insulin glargine (LANTUS SOLOSTAR) 100 UNIT/ML Solostar Pen Inject 16 Units into the skin every morning. (Patient taking differently: Inject  22 Units into the skin daily.)   Insulin Pen Needle (PEN NEEDLES) 32G X 4 MM MISC 1 each by Does not apply route 4 (four) times daily. E11.9   ipratropium (ATROVENT) 0.06 % nasal spray PLACE 2 SPRAYS INTO BOTH NOSTRILS 4 TIMES DAILY. (Patient taking differently: Place 1-2 sprays into both nostrils daily as needed for rhinitis.)   KLOR-CON M20 20 MEQ tablet TAKE 1 & 1/2 TABLETS BY MOUTH TWICE A DAY   Lancet Devices (EASY MINI EJECT LANCING DEVICE) MISC Use as directed 4 times daily to test blood sugar   LINZESS 290 MCG CAPS capsule TAKE 1 CAPSULE BY MOUTH DAILY BEFORE BREAKFAST. (Patient taking differently: as needed.)   meloxicam (MOBIC) 15 MG tablet Take 1 tablet (15 mg total) by mouth daily.   metFORMIN (GLUCOPHAGE-XR) 500 MG 24 hr tablet Take 4 tablets (2,000 mg total) by mouth daily with breakfast. (Patient taking differently: Take 1,000 mg by mouth 2 (two) times daily.)   Microlet Lancets MISC 1 each by Does not apply route 2 (two) times daily. E11.9   nystatin (MYCOSTATIN) 100000 UNIT/ML suspension Take 5 mLs (500,000 Units total) by mouth 4 (four) times daily.   ondansetron (ZOFRAN-ODT) 4 MG disintegrating tablet Take 1 tablet (4 mg total) by mouth every 8 (eight) hours as needed for nausea or vomiting.   oxyCODONE-acetaminophen (PERCOCET) 5-325 MG tablet Take 1 tablet by mouth every 4 (four) hours as needed for severe pain.   pantoprazole (PROTONIX) 40 MG tablet Take 1 tablet (40 mg total) by mouth 2 (two) times daily.   venlafaxine XR (EFFEXOR-XR) 75 MG 24 hr capsule TAKE 1 CAPSULE BY MOUTH TWICE A DAY   [DISCONTINUED] diltiazem (CARDIZEM CD) 120 MG 24 hr capsule Take 1 capsule (120 mg total) by mouth daily. Keep upcoming appointment for future refills.   [DISCONTINUED] telmisartan (MICARDIS) 40 MG tablet Take 1 tablet (40 mg total) by mouth daily.     Allergies:   Firvanq [vancomycin], Trexall [methotrexate], Zestril [lisinopril], Chlorhexidine, Cleocin [clindamycin], Lincocin [lincomycin  hcl], and Teflaro [ceftaroline]   Social History  Socioeconomic History   Marital status: Married    Spouse name: YARIMAR REINHART   Number of children: 2   Years of education: 12   Highest education level: Not on file  Occupational History   Occupation: DISABLED  Tobacco Use   Smoking status: Never   Smokeless tobacco: Never  Vaping Use   Vaping Use: Never used  Substance and Sexual Activity   Alcohol use: No    Alcohol/week: 0.0 standard drinks of alcohol   Drug use: No   Sexual activity: Yes    Birth control/protection: Surgical  Other Topics Concern   Not on file  Social History Narrative   Married 1994. 2 sons who both live close and 1 grandson.       Disability due to sarcoidosis. 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 [employer]   Social Determinants of Health   Financial Resource Strain: Low Risk  (02/15/2022)   Overall Financial Resource Strain (CARDIA)    Difficulty of Paying Living Expenses: Not hard at all  Food Insecurity: No Food Insecurity (11/10/2022)   Hunger Vital Sign    Worried About Running Out of Food in the Last Year: Never true    Ran Out of Food in the Last Year: Never true  Transportation Needs: No Transportation Needs (11/10/2022)   PRAPARE - Administrator, Civil Service (Medical): No    Lack of Transportation (Non-Medical): No  Physical Activity: Sufficiently Active (02/15/2022)   Exercise Vital Sign    Days of Exercise per Week: 3 days    Minutes of Exercise per Session: 60 min  Stress: No Stress Concern Present (02/15/2022)   [employer] of Occupational Health - Occupational Stress Questionnaire    Feeling of Stress : Not at all  Social Connections: Moderately Integrated (02/15/2022)   Social Connection and Isolation Panel [NHANES]    Frequency of Communication with Friends and Family: More than  three times a week    Frequency of Social Gatherings with Friends and Family: More than three times a week    Attends Religious Services: More than 4 times per year    Active Member of [employer] or Organizations: No    Attends Engineer, structural: Never    Marital Status: Married     Family History: The patient's family history includes Asthma in her mother; Breast cancer in her mother; COPD in her mother; Coronary artery disease in her father; Diabetes in her brother, father, and mother; Emphysema in her mother; Heart attack in her maternal grandfather; Heart attack (age of onset: 51) in her father; Heart disease in her father; Heart failure in her father and mother; Hypertension in her father; Kidney disease in her mother; Lung cancer in her brother; Obesity in her father; Sarcoidosis in her maternal uncle; Sleep apnea in her father; Throat cancer in her father; Thyroid disease in her mother. There is no history of Colon cancer, Colon polyps, Esophageal cancer, Rectal cancer, or Stomach cancer.  ROS:   Please see the history of present illness.      EKGs/Labs/Other Studies Reviewed:    The following studies were reviewed today:  Left Cardiac Catheterization 03/11/2017: 1. Angiographically normal left main, RCA, and LCx 2. Minimal nonobstructive irregularity in the LAD 3. Normal LVEDP _______________  Monitor 07/20/2018 to 08/18/2018: Mostly sinus rhythm: Rates 39 to 220 bpm.  Average 79 bpm. Roughly 2% PVCs and less than 1% PACs. No arrhythmias: No A. fib, PAT, MAT, SVT or VT.   Relatively normal study with occasional PVCs. _______________  Echocardiogram 06/08/2022: Impressions: 1. Left ventricular ejection fraction, by estimation, is 55 to 60%. The  left ventricle has normal function. The left ventricle has no regional  wall motion abnormalities. The left ventricular internal cavity size was  moderately dilated. Left ventricular  diastolic parameters are consistent with  Grade I diastolic dysfunction  (impaired relaxation).   2. Right ventricular systolic function is normal. The right ventricular  size is normal.   3. Left atrial size was mildly dilated.   4. A small pericardial effusion is present.   5. The mitral valve is normal in structure. No evidence of mitral valve  regurgitation. No evidence of mitral stenosis.   6. The aortic valve was not well visualized. Aortic valve regurgitation  is not visualized. No aortic stenosis is present.   7. The inferior vena cava is normal in size with greater than 50%  respiratory variability, suggesting right atrial pressure of 3 mmHg.   Comparison(s): Prior images unable to be directly viewed, comparison made  by report only. No significant change from prior study.   EKG:  EKG not ordered today.   Recent Labs: 11/09/2022: B Natriuretic Peptide 21.0 02/10/2023: ALT 16; BUN 16; Creatinine, Ser 1.15; Hemoglobin 11.4; Platelets 371.0; Potassium 4.9; Sodium 139  Recent Lipid Panel    Component Value Date/Time   CHOL 137 10/12/2022 0931   CHOL 162 03/31/2020 1316   TRIG 154.0 (H) 10/12/2022 0931   TRIG 168 (H) 09/14/2006 1033   HDL 50.60 10/12/2022 0931   HDL 40 03/31/2020 1316   CHOLHDL 3 10/12/2022 0931   VLDL 30.8 10/12/2022 0931   LDLCALC 56 10/12/2022 0931   LDLCALC 84 03/31/2020 1316   LDLDIRECT 84.0 07/21/2021 0852    Physical Exam:    Vital Signs: BP 132/70   Pulse 84   Ht 5\' 6"  (1.676 m)   Wt 213 lb 9.6 oz (96.9 kg)   SpO2 98%   BMI 34.48 kg/m     Wt Readings from Last 3 Encounters:  02/17/23 213 lb 9.6 oz (96.9 kg)  02/10/23 215 lb (97.5 kg)  12/15/22 211 lb 3.2 oz (95.8 kg)     General: 62 y.o. Caucasian female in no acute distress. HEENT: Normocephalic and atraumatic. Sclera clear. EOMs intact. Neck: Supple. No carotid bruits. No JVD. Heart: RRR. Distinct S1 and S2. No murmurs, gallops, or rubs. Radial pulses 2+ and equal bilaterally. Lungs: No increased work of breathing. Clear to  ausculation bilaterally. No wheezes, rhonchi, or rales.  Abdomen: Soft, non-distended, and non-tender to palpation.  Extremities: No lower extremity edema.  Varicose veins of bilateral lower extremities (left > right). Skin: Warm and dry. Neuro: Alert and oriented x3. No focal deficits. Psych: Normal affect. Responds appropriately.   Assessment:    1. Chronic diastolic CHF (congestive heart failure) (HCC)   2. Hypertension, unspecified type   3. Hyperlipidemia, unspecified hyperlipidemia type   4. Type 2 diabetes mellitus with obesity (HCC)   5. Varicose veins of both lower extremities, unspecified whether complicated   6. Pulmonary sarcoidosis (HCC)   7. Chronic obstructive pulmonary disease, unspecified COPD type (HCC)     Plan:    Chronic Diastolic CHF Last Echo in 05/2022 showed LVEF of 55-60% with normal wall motion and grade 1 diastolic dysfunction.  - Euvolemic on exam.  -  Continue Lasix 40mg  daily.   Hypertension BP well controlled.  - Continue current medications: Diltiazem 120mg  daily and Telmisartan 40mg  daily. Will provide refills of these.  Hyperlipidemia Lipid panel in 10/2022: Total Cholesterol 137, Triglycerides 154, HDL 50.60, LDL 56.  - Continue Lipitor 40mg  daily and Fenofibrate 160mg  daily..  Type 2 Diabetes Mellitus Hemoglobin A1c 8.2% in 12/2022. - On Metformin, Trulicity, and Insulin.  - Management per Endocrinology.  Varicose Veins Patient has varicose veins of bilateral lower extremities (left > right). These are sometimes tender to the touch. - Recommended compressions stockings and elevating legs as much as possible.  - Discussed possible referral to Vascular Surgery if these become very bothersome for her. She will let us know if she would like a referral placed.   Pulmonary Sarcoidosis  COPD - Followed by Pulmonology.   Disposition: Follow up in 1 year.    Medication Adjustments/Labs and Tests Ordered: Current medicines are reviewed at  length with the patient today.  Concerns regarding medicines are outlined above.  No orders of the defined types were placed in this encounter.  Meds ordered this encounter  Medications   diltiazem (CARDIZEM CD) 120 MG 24 hr capsule    Sig: Take 1 capsule (120 mg total) by mouth daily.    Dispense:  90 capsule    Refill:  3   telmisartan (MICARDIS) 40 MG tablet    Sig: Take 1 tablet (40 mg total) by mouth daily.    Dispense:  90 tablet    Refill:  3    Patient Instructions  Medication Instructions:   Your physician recommends that you continue on your current medications as directed. Please refer to the Current Medication list given to you today.  *If you need a refill on your cardiac medications before your next appointment, please call your pharmacy*  Lab Work: NONE ordered at this time of appointment   If you have labs (blood work) drawn today and your tests are completely normal, you will receive your results only by: MyChart Message (if you have MyChart) OR A paper copy in the mail If you have any lab test that is abnormal or we need to change your treatment, we will call you to review the results.  Testing/Procedures: NONE ordered at this time of appointment   Follow-Up: At [facility], you and your health needs are our priority.  As part of our continuing mission to provide you with exceptional heart care, we have created designated Provider Care Teams.  These Care Teams include your primary Cardiologist (physician) and Advanced Practice Providers (APPs -  Physician Assistants and Nurse Practitioners) who all work together to provide you with the care you need, when you need it.  Your next appointment:   1 year(s)  Provider:   Bryan Lemma, MD     Other Instructions     Signed, Corrin Parker, PA-C  02/17/2023 10:02 AM    [facility]

## 2023-02-09 ENCOUNTER — Other Ambulatory Visit: Payer: Self-pay | Admitting: Family Medicine

## 2023-02-10 ENCOUNTER — Ambulatory Visit (INDEPENDENT_AMBULATORY_CARE_PROVIDER_SITE_OTHER): Payer: Medicare Other | Admitting: Physician Assistant

## 2023-02-10 ENCOUNTER — Encounter: Payer: Self-pay | Admitting: Physician Assistant

## 2023-02-10 VITALS — BP 120/60 | HR 78 | Temp 97.5°F | Ht 60.0 in | Wt 215.0 lb

## 2023-02-10 DIAGNOSIS — R1030 Lower abdominal pain, unspecified: Secondary | ICD-10-CM | POA: Diagnosis not present

## 2023-02-10 DIAGNOSIS — R197 Diarrhea, unspecified: Secondary | ICD-10-CM | POA: Diagnosis not present

## 2023-02-10 DIAGNOSIS — R82998 Other abnormal findings in urine: Secondary | ICD-10-CM | POA: Diagnosis not present

## 2023-02-10 LAB — COMPREHENSIVE METABOLIC PANEL
ALT: 16 U/L (ref 0–35)
AST: 18 U/L (ref 0–37)
Albumin: 4.4 g/dL (ref 3.5–5.2)
Alkaline Phosphatase: 74 U/L (ref 39–117)
BUN: 16 mg/dL (ref 6–23)
CO2: 30 mEq/L (ref 19–32)
Calcium: 9.5 mg/dL (ref 8.4–10.5)
Chloride: 99 mEq/L (ref 96–112)
Creatinine, Ser: 1.15 mg/dL (ref 0.40–1.20)
GFR: 50.69 mL/min — ABNORMAL LOW (ref >60.00)
Glucose, Bld: 178 mg/dL — ABNORMAL HIGH (ref 70–99)
Potassium: 4.9 mEq/L (ref 3.5–5.1)
Sodium: 139 mEq/L (ref 135–145)
Total Bilirubin: 0.6 mg/dL (ref 0.2–1.2)
Total Protein: 6.7 g/dL (ref 6.0–8.3)

## 2023-02-10 LAB — POC URINALSYSI DIPSTICK (AUTOMATED)
Bilirubin, UA: NEGATIVE
Blood, UA: NEGATIVE
Glucose, UA: NEGATIVE
Ketones, UA: NEGATIVE
Nitrite, UA: NEGATIVE
Protein, UA: NEGATIVE
Spec Grav, UA: 1.015 (ref 1.010–1.025)
Urobilinogen, UA: 0.2 E.U./dL
pH, UA: 6.5 (ref 5.0–8.0)

## 2023-02-10 LAB — CBC WITH DIFFERENTIAL/PLATELET
Basophils Absolute: 0.1 10*3/uL (ref 0.0–0.1)
Basophils Relative: 0.8 % (ref 0.0–3.0)
Eosinophils Absolute: 0.4 10*3/uL (ref 0.0–0.7)
Eosinophils Relative: 3.3 % (ref 0.0–5.0)
HCT: 33.9 % — ABNORMAL LOW (ref 36.0–46.0)
Hemoglobin: 11.4 g/dL — ABNORMAL LOW (ref 12.0–15.0)
Lymphocytes Relative: 24.5 % (ref 12.0–46.0)
Lymphs Abs: 3 10*3/uL (ref 0.7–4.0)
MCHC: 33.7 g/dL (ref 30.0–36.0)
MCV: 89.9 fl (ref 78.0–100.0)
Monocytes Absolute: 0.7 10*3/uL (ref 0.1–1.0)
Monocytes Relative: 5.8 % (ref 3.0–12.0)
Neutro Abs: 7.9 10*3/uL — ABNORMAL HIGH (ref 1.4–7.7)
Neutrophils Relative %: 65.6 % (ref 43.0–77.0)
Platelets: 371 10*3/uL (ref 150.0–400.0)
RBC: 3.77 Mil/uL — ABNORMAL LOW (ref 3.87–5.11)
RDW: 13.2 % (ref 11.5–15.5)
WBC: 12.1 10*3/uL — ABNORMAL HIGH (ref 4.0–10.5)

## 2023-02-10 LAB — LIPASE: Lipase: 9 U/L — ABNORMAL LOW (ref 11.0–59.0)

## 2023-02-10 NOTE — Patient Instructions (Signed)
It was great to see you!  We are getting blood work and stool studies for further evaluation of symptoms  Please work on hydrating well  Let's hold your Metformin for a few days and your next Trulicity dose to see if this makes a significant difference in your symptoms  Contact a health care provider if: You have a fever. Your diarrhea gets worse. You have new symptoms. You vomit every time you eat or drink. You feel light-headed, dizzy, or have a headache. You have muscle cramps. You have signs of dehydration, such as: Dark urine, very little urine, or no urine. Cracked lips. Dry mouth. Sunken eyes. Sleepiness. Weakness. You have bloody or black stools or stools that look like tar. You have severe pain, cramping, or bloating in your abdomen. Your skin feels cold and clammy. You feel confused.    Take care,  Jarold Motto PA-C

## 2023-02-10 NOTE — Progress Notes (Signed)
[name] is a 62 y.o. female here for a new problem.  History of Present Illness:   Chief Complaint  Patient presents with   Abdominal Pain    Pt c/o lower abdominal pain started 2 weeks ago, diarrhea off and on.    HPI  Abdominal Pain: She complains of abdominal pain, diarrhea, and nausea starting 2 weeks ago.  She also endorses back pain and fecal urgency with diarrhea. She has about 5 bowel movements a day.  She states her stool is very dark, but not black.  Denies any blood in stool.  She has been compliant with Metformin and Trulicity, with no prior intolerance.  She has tried imodium without relief of symptoms Denies any current urinary symptoms.  Denies any changes in diet, lifestyle, or travel.  Denies recent antibiotic use  Past Medical History:  Diagnosis Date   Abnormal SPEP 04/17/2014   Acute left ankle pain 01/26/2017   ANEMIA-UNSPECIFIED 09/18/2009   Anxiety    Arthritis    Blood transfusion without reported diagnosis    Cataract    CHF (congestive heart failure) (HCC)    Chronic diastolic heart failure, NYHA class 2 (HCC)    Normal LVEDP by May 2018   COPD (chronic obstructive pulmonary disease) (HCC)    Depression    DIABETES MELLITUS, TYPE II 08/21/2006   Diabetic osteomyelitis (HCC) 05/29/2015   Fatty liver    Fracture of 5th metatarsal    non union   GERD 08/21/2006   Had fundoplication   GOUT 08/20/2010   Hammer toe of right foot    2-5th toes   Hx of umbilical hernia repair    HYPERLIPIDEMIA 08/21/2006   HYPERTENSION 08/21/2006   Infection of wound due to methicillin resistant Staphylococcus aureus (MRSA)    Internal hemorrhoids    Kidney problem    Multiple allergies 10/14/2016   OBESITY 06/04/2009   Onychomycosis 10/27/2015   Osteomyelitis of left foot (HCC) 05/29/2015   Osteoporosis    Pulmonary sarcoidosis (HCC)    Followed locally by pulmonology, but also by Dr. Sandy Salaam at [facility]   Right knee pain 01/26/2017   Vocal  cord dysfunction    Wears partial dentures      Social History   Tobacco Use   Smoking status: Never   Smokeless tobacco: Never  Vaping Use   Vaping Use: Never used  Substance Use Topics   Alcohol use: No    Alcohol/week: 0.0 standard drinks of alcohol   Drug use: No    Past Surgical History:  Procedure Laterality Date   ABDOMINAL HYSTERECTOMY     APPENDECTOMY     BLADDER SUSPENSION  11/11/2011   Procedure: TRANSVAGINAL TAPE (TVT) PROCEDURE;  Surgeon: Levi Aland, MD;  Location: [facility];  Service: Gynecology;  Laterality: N/A;   CAROTIDS  02/18/2011   CAROTID DUPLEX; VERTEBRALS ARE PATENT WITH ANTEGRADE FLOW. ICA/CCA RATIO 1.61 ON RIGHT AND 0.75 ON LEFT   CATARACT EXTRACTION, BILATERAL     CHOLECYSTECTOMY  1984   COLONOSCOPY  04/29/2010   Marina Goodell   CYSTOSCOPY  11/11/2011   Procedure: CYSTOSCOPY;  Surgeon: Levi Aland, MD;  Location: [facility];  Service: Gynecology;  Laterality: N/A;   EXTUBATION (ENDOTRACHEAL) IN OR N/A 09/23/2017   Procedure: EXTUBATION (ENDOTRACHEAL) IN OR;  Surgeon: Graylin Shiver, MD;  Location: [facility];  Service: ENT;  Laterality: N/A;   FIBEROPTIC LARYNGOSCOPY AND TRACHEOSCOPY N/A 09/23/2017   Procedure: FLEXIBLE FIBEROPTIC LARYNGOSCOPY;  Surgeon: Doran Heater,  Glee Arvin, MD;  Location: [facility] OR;  Service: ENT;  Laterality: N/A;   FRACTURE SURGERY     foot   HALLUX FUSION Left 10/02/2018   Procedure: HALLUX ARTHRODESIS;  Surgeon: Felecia Shelling, DPM;  Location: [facility];  Service: Podiatry;  Laterality: Left;   HAMMER TOE SURGERY Left 10/02/2018   Procedure: HAMMER TOE CORRECTION 2ND 3RD 4RD FIFTH TOE;  Surgeon: Felecia Shelling, DPM;  Location: [facility];  Service: Podiatry;  Laterality: Left;   HAMMER TOE SURGERY Right 04/12/2019   Procedure: HAMMER TOE CORRECTION, 2ND, 3RD, 4TH AND 5TH TOES OF RIGHT FOOT;  Surgeon: Felecia Shelling, DPM;  Location: [facility];  Service: Podiatry;  Laterality: Right;   HAMMER TOE SURGERY Left 10/25/2019   Procedure: HAMMER TOE  REPAIR SECOND, THIRD, FOUTH;  Surgeon: Felecia Shelling, DPM;  Location: [facility];  Service: Podiatry;  Laterality: Left;   HERNIA REPAIR     I & D EXTREMITY Left 06/27/2015   Procedure: Partial Excision Left Calcaneus, Place Antibiotic Beads, and Wound VAC;  Surgeon: Nadara Mustard, MD;  Location: [facility];  Service: Orthopedics;  Laterality: Left;   JOINT REPLACEMENT     KNEE ARTHROSCOPY     right   LEFT AND RIGHT HEART CATHETERIZATION WITH CORONARY ANGIOGRAM N/A 04/23/2013   Procedure: LEFT AND RIGHT HEART CATHETERIZATION WITH CORONARY ANGIOGRAM;  Surgeon: Marykay Lex, MD;  Location: [facility] CATH LAB;  Service: Cardiovascular;  Laterality: N/A;   LEFT HEART CATH AND CORONARY ANGIOGRAPHY N/A 03/11/2017   Procedure: Left Heart Cath and Coronary Angiography;  Surgeon: Tonny Bollman, MD;  Location: [facility] INVASIVE CV LAB; angiographically minimal CAD in the LAD otherwise normal.;  Normal LVEDP.  FALSE POSITIVE MYOVIEW   LEFT HEART CATH AND CORONARY ANGIOGRAPHY  07/20/2010   LVEF 50-55% WITH VERY MILD GLOBAL HYPOKINESIA; ESSENTIALLY NORMAL CORONARY ARTERIES; NORMAL LV FUNCTION   METATARSAL OSTEOTOMY WITH OPEN REDUCTION INTERNAL FIXATION (ORIF) METATARSAL WITH FUSION Left 04/09/2014   Procedure: LEFT FOOT FRACTURE OPEN TREATMENT METATARSAL INCLUDES INTERNAL FIXATION EACH;  Surgeon: Nilda Simmer, MD;  Location: [facility];  Service: Orthopedics;  Laterality: Left;   NISSEN FUNDOPLICATION  2004   NM MYOVIEW LTD --> FALSE POSITIVE  03/10/2017   : Moderate size "stress-induced "perfusion defect at the apex as well as "ill-defined stress-induced perfusion defect in the lateral wall.  EF 55%.  INTERMEDIATE risk. -->  FALSE POSITIVE   ORIF DISTAL FEMUR FRACTURE  08/08/2020   [facility]: ORIF of left extra-articular periprosthetic distal femur fracture with synthesis of the femur plate with cortical nonlocking screws.  (Thought to be pathologic fracture due to osteoporosis) -> after a fall    ORIF FEMUR W/ PERI-IMPLANT  08/29/2020   [facility]) revision of left femur fracture ORIF plate fixation screws; culture positive for MSSA   Right and left CARDIAC CATHETERIZATION  04/23/2013   Angiographic normal coronaries; LVEDP 20 mmHg, PCWP 12-14 mmHg, RAP 12 mmHg.; Fick CO/CI 4.9/2.2   ROTATOR CUFF REPAIR Right 12/09/2021   SHOULDER ARTHROSCOPY Left 03/14/2019   Procedure: LEFT SHOULDER ARTHROSCOPY, DEBRIDEMENT, AND DECOMPRESSION;  Surgeon: Nadara Mustard, MD;  Location: [facility];  Service: Orthopedics;  Laterality: Left;   TOTAL KNEE ARTHROPLASTY Right 06/29/2017   Procedure: RIGHT TOTAL KNEE ARTHROPLASTY;  Surgeon: Nadara Mustard, MD;  Location: [facility] OR;  Service: Orthopedics;  Laterality: Right;   TOTAL KNEE ARTHROPLASTY Left 12/07/2017   TOTAL KNEE ARTHROPLASTY Left 12/07/2017  Procedure: LEFT TOTAL KNEE ARTHROPLASTY;  Surgeon: Nadara Mustard, MD;  Location: [facility] OR;  Service: Orthopedics;  Laterality: Left;   TRACHEOSTOMY TUBE PLACEMENT N/A 09/20/2017   Procedure: AWAKE INTUBATION WITH ANESTHESIA WITH VIDEO ASSISTANCE;  Surgeon: Graylin Shiver, MD;  Location: [facility];  Service: ENT;  Laterality: N/A;   TRANSTHORACIC ECHOCARDIOGRAM  08/2014   Normal LV size and function.  Mild LVH.  EF 55-60%.  Normal regional wall motion.  GR 1 DD.  Normal RV size and function .   TRANSTHORACIC ECHOCARDIOGRAM  08/12/2020    [facility]): Normal LV size and function.  EF 55 to 60%.  No RWM A.  Normal filling pattern.  Normal RV.  No significant valvular disease-stenosis or regurgitation.  No vegetation.   TUBAL LIGATION     with reversal in 1994   UPPER GASTROINTESTINAL ENDOSCOPY     VENTRAL HERNIA REPAIR      Family History  Problem Relation Age of Onset   Diabetes Father    Heart attack Father 69   Coronary artery disease Father    Heart failure Father    Throat cancer Father    Hypertension Father    Heart disease Father    Sleep apnea Father    Obesity Father     COPD Mother    Emphysema Mother    Asthma Mother    Heart failure Mother    Breast cancer Mother    Diabetes Mother    Kidney disease Mother    Thyroid disease Mother    Heart attack Maternal Grandfather    Sarcoidosis Maternal Uncle    Lung cancer Brother    Diabetes Brother    Colon cancer Neg Hx    Colon polyps Neg Hx    Esophageal cancer Neg Hx    Rectal cancer Neg Hx    Stomach cancer Neg Hx     Allergies  Allergen Reactions   Firvanq [Vancomycin] Other (See Comments)    Dose related nephrotoxicity    Trexall [Methotrexate] Other (See Comments)    Peri-oral and buccal lesions   Zestril [Lisinopril] Cough   Chlorhexidine Itching   Cleocin [Clindamycin] Nausea And Vomiting and Rash   Lincocin [Lincomycin Hcl] Nausea And Vomiting and Rash   Teflaro [Ceftaroline] Rash and Other (See Comments)    Tolerates ceftriaxone     Current Medications:   Current Outpatient Medications:    acetaminophen (TYLENOL) 500 MG tablet, Take 1,000 mg by mouth daily as needed for headache, fever, moderate pain or mild pain., Disp: , Rfl:    albuterol (PROVENTIL) (2.5 MG/3ML) 0.083% nebulizer solution, Take 2.5 mg by nebulization every 6 (six) hours as needed for wheezing or shortness of breath., Disp: , Rfl:    allopurinol (ZYLOPRIM) 300 MG tablet, Take 1 tablet (300 mg total) by mouth daily., Disp: 90 tablet, Rfl: 0   aspirin EC 81 MG tablet, Take 1 tablet (81 mg total) by mouth daily., Disp: 30 tablet, Rfl: 11   atorvastatin (LIPITOR) 40 MG tablet, TAKE 1 TABLET BY MOUTH EVERY DAY (Patient taking differently: Take 40 mg by mouth daily.), Disp: 90 tablet, Rfl: 3   benzonatate (TESSALON PERLES) 100 MG capsule, Take 1-2 capsules (100-200 mg total) by mouth 3 (three) times daily as needed for cough., Disp: 30 capsule, Rfl: 0   BREO ELLIPTA 200-25 MCG/INH AEPB, TAKE 1 PUFF BY MOUTH EVERY DAY (Patient taking differently: Inhale 1 puff into the lungs daily.), Disp: 60 each, Rfl: 5  buPROPion  (WELLBUTRIN XL) 300 MG 24 hr tablet, TAKE 1 TABLET BY MOUTH EVERY DAY, Disp: 90 tablet, Rfl: 3   chlorpheniramine-HYDROcodone (TUSSIONEX) 10-8 MG/5ML, Take 5 mLs by mouth at bedtime as needed for cough (do not drive for 8 hours after taking)., Disp: 115 mL, Rfl: 0   Continuous Blood Gluc Sensor (DEXCOM G7 SENSOR) MISC, 1 Device by Does not apply route as directed. Change sensor every 10 days, Disp: 3 each, Rfl: 3   cyclobenzaprine (FLEXERIL) 5 MG tablet, Take 1 tablet (5 mg total) by mouth 3 (three) times daily as needed for muscle spasms (or Headache). (Patient taking differently: Take 5 mg by mouth 3 (three) times daily as needed for muscle spasms (headache).), Disp: 30 tablet, Rfl: 0   diltiazem (CARDIZEM CD) 120 MG 24 hr capsule, Take 1 capsule (120 mg total) by mouth daily. Keep upcoming appointment for future refills., Disp: 30 capsule, Rfl: 0   Dulaglutide (TRULICITY) 4.5 MG/0.5ML SOPN, Inject 4.5 mg as directed once a week. (Patient taking differently: Inject 4.5 mg as directed every Monday.), Disp: 6 mL, Rfl: 3   estradiol (ESTRACE) 0.1 MG/GM vaginal cream, Place 0.5 Applicatorfuls vaginally 2 (two) times a week., Disp: , Rfl:    fenofibrate 160 MG tablet, TAKE 1 TABLET BY MOUTH EVERY DAY, Disp: 90 tablet, Rfl: 1   fluticasone (FLONASE) 50 MCG/ACT nasal spray, Place 2 sprays into both nostrils daily as needed for allergies. (Patient taking differently: Place 1 spray into both nostrils daily.), Disp: 48 mL, Rfl: 3   furosemide (LASIX) 40 MG tablet, Take 1 tablet (40 mg total) by mouth daily., Disp: , Rfl:    gabapentin (NEURONTIN) 300 MG capsule, Take at night in addition to the 100mg  gabapentin to get 400mg  at night and 100mg  in the morning and at mid day. (Patient taking differently: Take 300 mg by mouth at bedtime as needed (pain, neuropathy).), Disp: 90 capsule, Rfl: 3   glucose blood (CONTOUR NEXT TEST) test strip, 1 each by Other route 4 (four) times daily. And lancets 4/day, Disp: 400 each,  Rfl: 3   ibuprofen (ADVIL) 800 MG tablet, Take 1 tablet (800 mg total) by mouth 3 (three) times daily., Disp: 60 tablet, Rfl: 1   insulin aspart (NOVOLOG FLEXPEN) 100 UNIT/ML FlexPen, Inject 15 Units into the skin daily with supper. (Patient taking differently: Inject 8-14 Units into the skin See admin instructions. Inject 8 units before breakfast, 10 units before lunch, 14 units before supper.), Disp: 15 mL, Rfl: 11   insulin glargine (LANTUS SOLOSTAR) 100 UNIT/ML Solostar Pen, Inject 16 Units into the skin every morning. (Patient taking differently: Inject 22 Units into the skin daily.), Disp: 30 mL, Rfl: 2   Insulin Pen Needle (PEN NEEDLES) 32G X 4 MM MISC, 1 each by Does not apply route 4 (four) times daily. E11.9, Disp: 400 each, Rfl: 0   ipratropium (ATROVENT) 0.06 % nasal spray, PLACE 2 SPRAYS INTO BOTH NOSTRILS 4 TIMES DAILY. (Patient taking differently: Place 1-2 sprays into both nostrils daily as needed for rhinitis.), Disp: 15 mL, Rfl: 3   KLOR-CON M20 20 MEQ tablet, TAKE 1 & 1/2 TABLETS BY MOUTH TWICE A DAY, Disp: 270 tablet, Rfl: 2   Lancet Devices (EASY MINI EJECT LANCING DEVICE) MISC, Use as directed 4 times daily to test blood sugar, Disp: 1 each, Rfl: 3   LINZESS 290 MCG CAPS capsule, TAKE 1 CAPSULE BY MOUTH DAILY BEFORE BREAKFAST. (Patient taking differently: as needed.), Disp: 30 capsule, Rfl: 3  meloxicam (MOBIC) 15 MG tablet, Take 1 tablet (15 mg total) by mouth daily., Disp: 30 tablet, Rfl: 1   metFORMIN (GLUCOPHAGE-XR) 500 MG 24 hr tablet, Take 4 tablets (2,000 mg total) by mouth daily with breakfast. (Patient taking differently: Take 1,000 mg by mouth 2 (two) times daily.), Disp: 360 tablet, Rfl: 3   Microlet Lancets MISC, 1 each by Does not apply route 2 (two) times daily. E11.9, Disp: 100 each, Rfl: 2   nystatin (MYCOSTATIN) 100000 UNIT/ML suspension, Take 5 mLs (500,000 Units total) by mouth 4 (four) times daily., Disp: 180 mL, Rfl: 1   ondansetron (ZOFRAN-ODT) 4 MG  disintegrating tablet, Take 1 tablet (4 mg total) by mouth every 8 (eight) hours as needed for nausea or vomiting., Disp: 20 tablet, Rfl: 2   oxyCODONE-acetaminophen (PERCOCET) 5-325 MG tablet, Take 1 tablet by mouth every 4 (four) hours as needed for severe pain., Disp: 30 tablet, Rfl: 0   pantoprazole (PROTONIX) 40 MG tablet, Take 1 tablet (40 mg total) by mouth 2 (two) times daily., Disp: 180 tablet, Rfl: 3   telmisartan (MICARDIS) 40 MG tablet, Take 1 tablet (40 mg total) by mouth daily., Disp: 90 tablet, Rfl: 3   venlafaxine XR (EFFEXOR-XR) 75 MG 24 hr capsule, TAKE 1 CAPSULE BY MOUTH TWICE A DAY, Disp: 180 capsule, Rfl: 3   Review of Systems:   Review of Systems  Gastrointestinal:  Positive for abdominal pain, diarrhea (with fecal urgency) and nausea.  Musculoskeletal:  Positive for back pain.    Vitals:   Vitals:   02/10/23 1119  BP: 120/60  Pulse: 78  Temp: (!) 97.5 F (36.4 C)  TempSrc: Temporal  SpO2: 99%  Weight: 215 lb (97.5 kg)  Height: 5' (1.524 m)     Body mass index is 41.99 kg/m.  Physical Exam:   Physical Exam Vitals and nursing note reviewed.  Constitutional:      General: She is not in acute distress.    Appearance: She is well-developed. She is not ill-appearing or toxic-appearing.  Cardiovascular:     Rate and Rhythm: Normal rate and regular rhythm.     Pulses: Normal pulses.     Heart sounds: Normal heart sounds, S1 normal and S2 normal.  Pulmonary:     Effort: Pulmonary effort is normal.     Breath sounds: Normal breath sounds.  Abdominal:     General: Abdomen is flat.     Palpations: Abdomen is soft.     Tenderness: There is generalized abdominal tenderness. There is no right CVA tenderness or left CVA tenderness.     Comments: Numerous scars on entire abdomen Has some slight generalized tenderness in upper abdomen but no guarding or rebound present  Skin:    General: Skin is warm and dry.  Neurological:     Mental Status: She is alert.      GCS: GCS eye subscore is 4. GCS verbal subscore is 5. GCS motor subscore is 6.  Psychiatric:        Speech: Speech normal.        Behavior: Behavior normal. Behavior is cooperative.    Results for orders placed or performed in visit on 02/10/23  POCT Urinalysis Dipstick (Automated)  Result Value Ref Range   Color, UA yellow    Clarity, UA clear    Glucose, UA Negative Negative   Bilirubin, UA Negative    Ketones, UA Negative    Spec Grav, UA 1.015 1.010 - 1.025   Blood, UA Negative  pH, UA 6.5 5.0 - 8.0   Protein, UA Negative Negative   Urobilinogen, UA 0.2 0.2 or 1.0 E.U./dL   Nitrite, UA Negative    Leukocytes, UA Small (1+) (A) Negative    Assessment and Plan:   Diarrhea, unspecified type No red flags on my exam Vitals show that she is pretty well-hydrated and she does not appear acutely ill Abdomen is not acute on my exam  Suspect likely viral etiology I have ordered blood work, urine culture and stool cultures She was instructed to hold her metformin and her next Trulicity dose to see if this changes her symptoms in any way  Very low threshold to go to the ER if any worsening symptoms or concerns for significant dehydration due to multiple comorbidities    I,Rachel Rivera,acting as a scribe for [employer], PA.,have documented all relevant documentation on the behalf of Jarold Motto, PA,as directed by  Jarold Motto, PA while in the presence of Jarold Motto, [place].  I, Jarold Motto, [place], have reviewed all documentation for this visit. The documentation on 02/10/23 for the exam, diagnosis, procedures, and orders are all accurate and complete.   Jarold Motto, PA-C

## 2023-02-11 ENCOUNTER — Ambulatory Visit: Payer: Medicare Other | Admitting: Physician Assistant

## 2023-02-11 LAB — URINE CULTURE
MICRO NUMBER:: 14905154
SPECIMEN QUALITY:: ADEQUATE

## 2023-02-17 ENCOUNTER — Encounter: Payer: Self-pay | Admitting: Student

## 2023-02-17 ENCOUNTER — Ambulatory Visit: Payer: BC Managed Care – PPO | Attending: Nurse Practitioner | Admitting: Student

## 2023-02-17 ENCOUNTER — Encounter: Payer: BC Managed Care – PPO | Admitting: Family Medicine

## 2023-02-17 VITALS — BP 132/70 | HR 84 | Ht 66.0 in | Wt 213.6 lb

## 2023-02-17 DIAGNOSIS — I5032 Chronic diastolic (congestive) heart failure: Secondary | ICD-10-CM | POA: Diagnosis not present

## 2023-02-17 DIAGNOSIS — I10 Essential (primary) hypertension: Secondary | ICD-10-CM

## 2023-02-17 DIAGNOSIS — I8393 Asymptomatic varicose veins of bilateral lower extremities: Secondary | ICD-10-CM | POA: Diagnosis not present

## 2023-02-17 DIAGNOSIS — E785 Hyperlipidemia, unspecified: Secondary | ICD-10-CM

## 2023-02-17 DIAGNOSIS — Z7985 Long-term (current) use of injectable non-insulin antidiabetic drugs: Secondary | ICD-10-CM

## 2023-02-17 DIAGNOSIS — E1169 Type 2 diabetes mellitus with other specified complication: Secondary | ICD-10-CM

## 2023-02-17 DIAGNOSIS — J449 Chronic obstructive pulmonary disease, unspecified: Secondary | ICD-10-CM

## 2023-02-17 DIAGNOSIS — E669 Obesity, unspecified: Secondary | ICD-10-CM

## 2023-02-17 DIAGNOSIS — D860 Sarcoidosis of lung: Secondary | ICD-10-CM | POA: Diagnosis not present

## 2023-02-17 MED ORDER — TELMISARTAN 40 MG PO TABS: 40.0000 mg | ORAL_TABLET | Freq: Every day | ORAL | 3 refills | Status: DC

## 2023-02-17 MED ORDER — DILTIAZEM HCL ER COATED BEADS 120 MG PO CP24: 120.0000 mg | ORAL_CAPSULE | Freq: Every day | ORAL | 3 refills | Status: DC

## 2023-02-17 NOTE — Patient Instructions (Addendum)
Medication Instructions:   Your physician recommends that you continue on your current medications as directed. Please refer to the Current Medication list given to you today.  *If you need a refill on your cardiac medications before your next appointment, please call your pharmacy*  Lab Work: NONE ordered at this time of appointment   If you have labs (blood work) drawn today and your tests are completely normal, you will receive your results only by: MyChart Message (if you have MyChart) OR A paper copy in the mail If you have any lab test that is abnormal or we need to change your treatment, we will call you to review the results.  Testing/Procedures: NONE ordered at this time of appointment   Follow-Up: At [facility], you and your health needs are our priority.  As part of our continuing mission to provide you with exceptional heart care, we have created designated Provider Care Teams.  These Care Teams include your primary Cardiologist (physician) and Advanced Practice Providers (APPs -  Physician Assistants and Nurse Practitioners) who all work together to provide you with the care you need, when you need it.  Your next appointment:   1 year(s)  Provider:   Bryan Lemma, MD     Other Instructions

## 2023-02-25 ENCOUNTER — Encounter: Payer: Self-pay | Admitting: Family Medicine

## 2023-02-25 ENCOUNTER — Ambulatory Visit (INDEPENDENT_AMBULATORY_CARE_PROVIDER_SITE_OTHER): Payer: Medicare Other | Admitting: Family Medicine

## 2023-02-25 ENCOUNTER — Other Ambulatory Visit: Payer: Self-pay | Admitting: Endocrinology

## 2023-02-25 VITALS — BP 134/62 | HR 96 | Temp 97.6°F | Ht 66.0 in | Wt 211.6 lb

## 2023-02-25 DIAGNOSIS — N1831 Chronic kidney disease, stage 3a: Secondary | ICD-10-CM | POA: Diagnosis not present

## 2023-02-25 DIAGNOSIS — E1122 Type 2 diabetes mellitus with diabetic chronic kidney disease: Secondary | ICD-10-CM | POA: Diagnosis not present

## 2023-02-25 DIAGNOSIS — E785 Hyperlipidemia, unspecified: Secondary | ICD-10-CM

## 2023-02-25 DIAGNOSIS — E1169 Type 2 diabetes mellitus with other specified complication: Secondary | ICD-10-CM

## 2023-02-25 DIAGNOSIS — I10 Essential (primary) hypertension: Secondary | ICD-10-CM | POA: Diagnosis not present

## 2023-02-25 DIAGNOSIS — E538 Deficiency of other specified B group vitamins: Secondary | ICD-10-CM | POA: Diagnosis not present

## 2023-02-25 DIAGNOSIS — Z0000 Encounter for general adult medical examination without abnormal findings: Secondary | ICD-10-CM | POA: Diagnosis not present

## 2023-02-25 DIAGNOSIS — F3342 Major depressive disorder, recurrent, in full remission: Secondary | ICD-10-CM

## 2023-02-25 DIAGNOSIS — I700 Atherosclerosis of aorta: Secondary | ICD-10-CM | POA: Diagnosis not present

## 2023-02-25 DIAGNOSIS — Z794 Long term (current) use of insulin: Secondary | ICD-10-CM | POA: Diagnosis not present

## 2023-02-25 DIAGNOSIS — D539 Nutritional anemia, unspecified: Secondary | ICD-10-CM

## 2023-02-25 DIAGNOSIS — Z780 Asymptomatic menopausal state: Secondary | ICD-10-CM | POA: Diagnosis not present

## 2023-02-25 LAB — CBC WITH DIFFERENTIAL/PLATELET
Basophils Absolute: 0.1 10*3/uL (ref 0.0–0.1)
Basophils Relative: 0.7 % (ref 0.0–3.0)
Eosinophils Absolute: 0.4 10*3/uL (ref 0.0–0.7)
Eosinophils Relative: 3.4 % (ref 0.0–5.0)
HCT: 34.3 % — ABNORMAL LOW (ref 36.0–46.0)
Hemoglobin: 11.5 g/dL — ABNORMAL LOW (ref 12.0–15.0)
Lymphocytes Relative: 24 % (ref 12.0–46.0)
Lymphs Abs: 2.7 10*3/uL (ref 0.7–4.0)
MCHC: 33.5 g/dL (ref 30.0–36.0)
MCV: 89.7 fl (ref 78.0–100.0)
Monocytes Absolute: 0.6 10*3/uL (ref 0.1–1.0)
Monocytes Relative: 5.8 % (ref 3.0–12.0)
Neutro Abs: 7.3 10*3/uL (ref 1.4–7.7)
Neutrophils Relative %: 66.1 % (ref 43.0–77.0)
Platelets: 389 10*3/uL (ref 150.0–400.0)
RBC: 3.82 Mil/uL — ABNORMAL LOW (ref 3.87–5.11)
RDW: 12.9 % (ref 11.5–15.5)
WBC: 11.1 10*3/uL — ABNORMAL HIGH (ref 4.0–10.5)

## 2023-02-25 LAB — IBC + FERRITIN
Ferritin: 115.9 ng/mL (ref 10.0–291.0)
Iron: 47 ug/dL (ref 42–145)
Saturation Ratios: 10.1 % — ABNORMAL LOW (ref 20.0–50.0)
TIBC: 464.8 ug/dL — ABNORMAL HIGH (ref 250.0–450.0)
Transferrin: 332 mg/dL (ref 212.0–360.0)

## 2023-02-25 LAB — MICROALBUMIN / CREATININE URINE RATIO
Creatinine,U: 105 mg/dL
Microalb Creat Ratio: 2.1 mg/g (ref 0.0–30.0)
Microalb, Ur: 2.2 mg/dL — ABNORMAL HIGH (ref 0.0–1.9)

## 2023-02-25 LAB — VITAMIN B12: Vitamin B-12: 204 pg/mL — ABNORMAL LOW (ref 211–911)

## 2023-02-25 NOTE — Patient Instructions (Addendum)
You are eligible to schedule your annual wellness visit with our nurse specialist Inetta Fermo.  Please consider scheduling this before you leave today  Schedule your bone density test at check out desk.  - located [address] across the street from [place] - in the basement - you DO NEED an appointment for the bone density tests.   Please stop by lab before you go If you have mychart- we will send your results within 3 business days of [place] receiving them.  If you do not have mychart- we will call you about results within 5 business days of [place] receiving them.  *please also note that you will see labs on mychart as soon as they post. I will later go in and write notes on them- will say "notes from Dr. Durene Cal"   Recommended follow up: Return in about 4 months (around 06/28/2023) for followup or sooner if needed.Schedule b4 you leave.

## 2023-02-25 NOTE — Progress Notes (Signed)
Phone [phone]   Subjective:  Patient presents today for their annual physical. Chief complaint-noted.   See problem oriented charting- ROS- full  review of systems was completed and negative except for: post nasal drip, eye itching, prior diarrhea episodes have almost resolved- not taking linzes, some nausea in the morning in particular- ondansetron as needed- plans to schedule with gastroenterology - had scheduled but then cancelled as better- with oscillation plans to schedule- largley normal EGD on 09/13/22- no unintentional weight loss- still able to eat  The following were reviewed and entered/updated in epic: Past Medical History:  Diagnosis Date   Abnormal SPEP 04/17/2014   Acute left ankle pain 01/26/2017   ANEMIA-UNSPECIFIED 09/18/2009   Anxiety    Arthritis    Blood transfusion without reported diagnosis    Cataract    CHF (congestive heart failure) (HCC)    Chronic diastolic heart failure, NYHA class 2 (HCC)    Normal LVEDP by May 2018   COPD (chronic obstructive pulmonary disease) (HCC)    Depression    DIABETES MELLITUS, TYPE II 08/21/2006   Diabetic osteomyelitis (HCC) 05/29/2015   Fatty liver    Fracture of 5th metatarsal    non union   GERD 08/21/2006   Had fundoplication   GOUT 08/20/2010   Hammer toe of right foot    2-5th toes   Hx of umbilical hernia repair    HYPERLIPIDEMIA 08/21/2006   HYPERTENSION 08/21/2006   Infection of wound due to methicillin resistant Staphylococcus aureus (MRSA)    Internal hemorrhoids    Kidney problem    Multiple allergies 10/14/2016   OBESITY 06/04/2009   Onychomycosis 10/27/2015   Osteomyelitis of left foot (HCC) 05/29/2015   Osteoporosis    Pulmonary sarcoidosis (HCC)    Followed locally by pulmonology, but also by Dr. Sandy Salaam at [facility]   Right knee pain 01/26/2017   Vocal cord dysfunction    Wears partial dentures    Patient Active Problem List   Diagnosis Date Noted   Severe persistent asthma  01/04/2019    Priority: High   Subcortical microvascular ischemic occlusive disease 07/12/2018    Priority: High   Chronic diastolic CHF (congestive heart failure) (HCC) 08/13/2017    Priority: High   Pulmonary sarcoidosis (HCC)     Priority: High   Chronic bronchitis (HCC) in never smoker 09/18/2013    Priority: High   Chest pain 04/11/2013    Priority: High   Hypertensive heart disease with chronic diastolic congestive heart failure (HCC)     Priority: High   Sarcoidosis of lung (HCC) 04/10/2007    Priority: High   Type II diabetes mellitus with renal manifestations (HCC) 08/21/2006    Priority: High   Iron deficiency anemia 10/01/2020    Priority: Medium    Hypertriglyceridemia 04/02/2020    Priority: Medium    Aortic atherosclerosis (HCC) 02/14/2020    Priority: Medium    Constipation 04/21/2018    Priority: Medium    Epiglottitis     Priority: Medium    Osteoarthritis of right knee 01/26/2017    Priority: Medium    CKD (chronic kidney disease) stage 3, GFR 30-59 ml/min (HCC) 04/27/2015    Priority: Medium    History of osteomyelitis 03/27/2015    Priority: Medium    Fatty liver 09/30/2014    Priority: Medium    Major depression in full remission [facility])     Priority: Medium    Gout 08/20/2010    Priority: Medium  Deficiency anemia 09/18/2009    Priority: Medium    Sleep apnea 04/21/2009    Priority: Medium    Hyperlipidemia associated with type 2 diabetes mellitus (HCC) 08/21/2006    Priority: Medium    Hypertension 08/21/2006    Priority: Medium    Nontraumatic complete tear of left rotator cuff     Priority: Low   Diabetic neuropathy (HCC) 02/20/2019    Priority: Low   Rapid palpitations 07/12/2018    Priority: Low   Impingement of left ankle joint 06/26/2018    Priority: Low   Total knee replacement status, left 12/07/2017    Priority: Low   Dysphagia 09/20/2017    Priority: Low   Plantar fasciitis, right 07/13/2017    Priority: Low   S/P total  knee arthroplasty, right 06/29/2017    Priority: Low   Osteoarthrosis, localized, primary, knee, right     Priority: Low   Sprain of calcaneofibular ligament of right ankle 06/06/2017    Priority: Low   Onychomycosis 10/27/2015    Priority: Low   MRSA (methicillin resistant staph aureus) culture positive 03/27/2015    Priority: Low   Hot flashes 07/15/2014    Priority: Low   Abnormal SPEP 04/17/2014    Priority: Low   Fracture of left leg 04/17/2014    Priority: Low   Internal hemorrhoids     Priority: Low   Preoperative clearance 03/25/2014    Priority: Low   Solitary pulmonary nodule, on CT 02/2013 - stable over 2 years in 2015 02/20/2013    Priority: Low   GERD 08/21/2006    Priority: Low   Chronic pain of left wrist 04/19/2022    Priority: 1.   Facet arthropathy, cervical 06/29/2021    Priority: 1.   Foraminal stenosis of cervical region 06/29/2021    Priority: 1.   Preop cardiovascular exam 12/07/2021   Postural dizziness with presyncope 09/30/2020   Past Surgical History:  Procedure Laterality Date   ABDOMINAL HYSTERECTOMY     APPENDECTOMY     BLADDER SUSPENSION  11/11/2011   Procedure: TRANSVAGINAL TAPE (TVT) PROCEDURE;  Surgeon: Levi Aland, MD;  Location: [facility];  Service: Gynecology;  Laterality: N/A;   CAROTIDS  02/18/2011   CAROTID DUPLEX; VERTEBRALS ARE PATENT WITH ANTEGRADE FLOW. ICA/CCA RATIO 1.61 ON RIGHT AND 0.75 ON LEFT   CATARACT EXTRACTION, BILATERAL     CHOLECYSTECTOMY  1984   COLONOSCOPY  04/29/2010   Marina Goodell   CYSTOSCOPY  11/11/2011   Procedure: CYSTOSCOPY;  Surgeon: Levi Aland, MD;  Location: [facility];  Service: Gynecology;  Laterality: N/A;   EXTUBATION (ENDOTRACHEAL) IN OR N/A 09/23/2017   Procedure: EXTUBATION (ENDOTRACHEAL) IN OR;  Surgeon: Graylin Shiver, MD;  Location: [facility];  Service: ENT;  Laterality: N/A;   FIBEROPTIC LARYNGOSCOPY AND TRACHEOSCOPY N/A 09/23/2017   Procedure: FLEXIBLE FIBEROPTIC LARYNGOSCOPY;  Surgeon:  Graylin Shiver, MD;  Location: [facility];  Service: ENT;  Laterality: N/A;   FRACTURE SURGERY     foot   HALLUX FUSION Left 10/02/2018   Procedure: HALLUX ARTHRODESIS;  Surgeon: Felecia Shelling, DPM;  Location: [facility];  Service: Podiatry;  Laterality: Left;   HAMMER TOE SURGERY Left 10/02/2018   Procedure: HAMMER TOE CORRECTION 2ND 3RD 4RD FIFTH TOE;  Surgeon: Felecia Shelling, DPM;  Location: [facility];  Service: Podiatry;  Laterality: Left;   HAMMER TOE SURGERY Right 04/12/2019   Procedure: HAMMER TOE CORRECTION, 2ND, 3RD, 4TH AND 5TH TOES OF RIGHT FOOT;  Surgeon: Felecia Shelling, DPM;  Location: [facility];  Service: Podiatry;  Laterality: Right;   HAMMER TOE SURGERY Left 10/25/2019   Procedure: HAMMER TOE REPAIR SECOND, THIRD, FOUTH;  Surgeon: Felecia Shelling, DPM;  Location: [facility];  Service: Podiatry;  Laterality: Left;   HERNIA REPAIR     I & D EXTREMITY Left 06/27/2015   Procedure: Partial Excision Left Calcaneus, Place Antibiotic Beads, and Wound VAC;  Surgeon: Nadara Mustard, MD;  Location: [facility];  Service: Orthopedics;  Laterality: Left;   JOINT REPLACEMENT     KNEE ARTHROSCOPY     right   LEFT AND RIGHT HEART CATHETERIZATION WITH CORONARY ANGIOGRAM N/A 04/23/2013   Procedure: LEFT AND RIGHT HEART CATHETERIZATION WITH CORONARY ANGIOGRAM;  Surgeon: Marykay Lex, MD;  Location: [facility] CATH LAB;  Service: Cardiovascular;  Laterality: N/A;   LEFT HEART CATH AND CORONARY ANGIOGRAPHY N/A 03/11/2017   Procedure: Left Heart Cath and Coronary Angiography;  Surgeon: Tonny Bollman, MD;  Location: [facility] INVASIVE CV LAB; angiographically minimal CAD in the LAD otherwise normal.;  Normal LVEDP.  FALSE POSITIVE MYOVIEW   LEFT HEART CATH AND CORONARY ANGIOGRAPHY  07/20/2010   LVEF 50-55% WITH VERY MILD GLOBAL HYPOKINESIA; ESSENTIALLY NORMAL CORONARY ARTERIES; NORMAL LV FUNCTION   METATARSAL OSTEOTOMY WITH OPEN REDUCTION INTERNAL FIXATION (ORIF) METATARSAL WITH FUSION Left 04/09/2014   Procedure: LEFT FOOT  FRACTURE OPEN TREATMENT METATARSAL INCLUDES INTERNAL FIXATION EACH;  Surgeon: Nilda Simmer, MD;  Location: [facility];  Service: Orthopedics;  Laterality: Left;   NISSEN FUNDOPLICATION  2004   NM MYOVIEW LTD --> FALSE POSITIVE  03/10/2017   : Moderate size "stress-induced "perfusion defect at the apex as well as "ill-defined stress-induced perfusion defect in the lateral wall.  EF 55%.  INTERMEDIATE risk. -->  FALSE POSITIVE   ORIF DISTAL FEMUR FRACTURE  08/08/2020   [facility]: ORIF of left extra-articular periprosthetic distal femur fracture with synthesis of the femur plate with cortical nonlocking screws.  (Thought to be pathologic fracture due to osteoporosis) -> after a fall   ORIF FEMUR W/ PERI-IMPLANT  08/29/2020   [facility]) revision of left femur fracture ORIF plate fixation screws; culture positive for MSSA   Right and left CARDIAC CATHETERIZATION  04/23/2013   Angiographic normal coronaries; LVEDP 20 mmHg, PCWP 12-14 mmHg, RAP 12 mmHg.; Fick CO/CI 4.9/2.2   ROTATOR CUFF REPAIR Right 12/09/2021   SHOULDER ARTHROSCOPY Left 03/14/2019   Procedure: LEFT SHOULDER ARTHROSCOPY, DEBRIDEMENT, AND DECOMPRESSION;  Surgeon: Nadara Mustard, MD;  Location[facility];  Service: Orthopedics;  Laterality: Left;   TOTAL KNEE ARTHROPLASTY Right 06/29/2017   Procedure: RIGHT TOTAL KNEE ARTHROPLASTY;  Surgeon: Nadara Mustard, MD;  Location: [facility] OR;  Service: Orthopedics;  Laterality: Right;   TOTAL KNEE ARTHROPLASTY Left 12/07/2017   TOTAL KNEE ARTHROPLASTY Left 12/07/2017   Procedure: LEFT TOTAL KNEE ARTHROPLASTY;  Surgeon: Nadara Mustard, MD;  Location: [facility] OR;  Service: Orthopedics;  Laterality: Left;   TRACHEOSTOMY TUBE PLACEMENT N/A 09/20/2017   Procedure: AWAKE INTUBATION WITH ANESTHESIA WITH VIDEO ASSISTANCE;  Surgeon: Graylin Shiver, MD;  [facility];  Service: ENT;  Laterality: N/A;   TRANSTHORACIC ECHOCARDIOGRAM  08/2014   Normal LV size and  function.  Mild LVH.  EF 55-60%.  Normal regional wall motion.  GR 1 DD.  Normal RV size and function .   TRANSTHORACIC ECHOCARDIOGRAM  08/12/2020    [facility]): Normal LV size and  function.  EF 55 to 60%.  No RWM A.  Normal filling pattern.  Normal RV.  No significant valvular disease-stenosis or regurgitation.  No vegetation.   TUBAL LIGATION     with reversal in 1994   UPPER GASTROINTESTINAL ENDOSCOPY     VENTRAL HERNIA REPAIR      Family History  Problem Relation Age of Onset   Diabetes Father    Heart attack Father 104   Coronary artery disease Father    Heart failure Father    Throat cancer Father    Hypertension Father    Heart disease Father    Sleep apnea Father    Obesity Father    COPD Mother    Emphysema Mother    Asthma Mother    Heart failure Mother    Breast cancer Mother    Diabetes Mother    Kidney disease Mother    Thyroid disease Mother    Heart attack Maternal Grandfather    Sarcoidosis Maternal Uncle    Lung cancer Brother    Diabetes Brother    Colon cancer Neg Hx    Colon polyps Neg Hx    Esophageal cancer Neg Hx    Rectal cancer Neg Hx    Stomach cancer Neg Hx     Medications- reviewed and updated Current Outpatient Medications  Medication Sig Dispense Refill   albuterol (PROVENTIL) (2.5 MG/3ML) 0.083% nebulizer solution Take 2.5 mg by nebulization every 6 (six) hours as needed for wheezing or shortness of breath.     allopurinol (ZYLOPRIM) 300 MG tablet Take 1 tablet (300 mg total) by mouth daily. 90 tablet 0   aspirin EC 81 MG tablet Take 1 tablet (81 mg total) by mouth daily. 30 tablet 11   atorvastatin (LIPITOR) 40 MG tablet TAKE 1 TABLET BY MOUTH EVERY DAY (Patient taking differently: Take 40 mg by mouth daily.) 90 tablet 3   BREO ELLIPTA 200-25 MCG/INH AEPB TAKE 1 PUFF BY MOUTH EVERY DAY (Patient taking differently: Inhale 1 puff into the lungs daily.) 60 each 5   buPROPion (WELLBUTRIN XL) 300 MG 24 hr tablet TAKE 1 TABLET BY  MOUTH EVERY DAY 90 tablet 3   Continuous Blood Gluc Sensor (DEXCOM G7 SENSOR) MISC 1 Device by Does not apply route as directed. Change sensor every 10 days 3 each 3   diltiazem (CARDIZEM CD) 120 MG 24 hr capsule Take 1 capsule (120 mg total) by mouth daily. 90 capsule 3   Dulaglutide (TRULICITY) 4.5 MG/0.5ML SOPN Inject 4.5 mg as directed once a week. (Patient taking differently: Inject 4.5 mg as directed every Monday.) 6 mL 3   estradiol (ESTRACE) 0.1 MG/GM vaginal cream Place 0.5 Applicatorfuls vaginally 2 (two) times a week.     fenofibrate 160 MG tablet TAKE 1 TABLET BY MOUTH EVERY DAY 90 tablet 1   fluticasone (FLONASE) 50 MCG/ACT nasal spray Place 2 sprays into both nostrils daily as needed for allergies. (Patient taking differently: Place 1 spray into both nostrils daily.) 48 mL 3   furosemide (LASIX) 40 MG tablet Take 1 tablet (40 mg total) by mouth daily.     gabapentin (NEURONTIN) 300 MG capsule Take at night in addition to the 100mg  gabapentin to get 400mg  at night and 100mg  in the morning and at mid day. (Patient taking differently: Take 300 mg by mouth at bedtime as needed (pain, neuropathy).) 90 capsule 3   glucose blood (CONTOUR NEXT TEST) test strip 1 each by Other route 4 (four) times  daily. And lancets 4/day 400 each 3   ibuprofen (ADVIL) 800 MG tablet Take 1 tablet (800 mg total) by mouth 3 (three) times daily. 60 tablet 1   insulin aspart (NOVOLOG FLEXPEN) 100 UNIT/ML FlexPen Inject 15 Units into the skin daily with supper. (Patient taking differently: Inject 8-14 Units into the skin See admin instructions. Inject 8 units before breakfast, 10 units before lunch, 14 units before supper.) 15 mL 11   insulin glargine (LANTUS SOLOSTAR) 100 UNIT/ML Solostar Pen Inject 16 Units into the skin every morning. (Patient taking differently: Inject 22 Units into the skin daily.) 30 mL 2   Insulin Pen Needle (PEN NEEDLES) 32G X 4 MM MISC 1 each by Does not apply route 4 (four) times daily. E11.9  400 each 0   ipratropium (ATROVENT) 0.06 % nasal spray PLACE 2 SPRAYS INTO BOTH NOSTRILS 4 TIMES DAILY. (Patient taking differently: Place 1-2 sprays into both nostrils daily as needed for rhinitis.) 15 mL 3   KLOR-CON M20 20 MEQ tablet TAKE 1 & 1/2 TABLETS BY MOUTH TWICE A DAY 270 tablet 2   Lancet Devices (EASY MINI EJECT LANCING DEVICE) MISC Use as directed 4 times daily to test blood sugar 1 each 3   LINZESS 290 MCG CAPS capsule TAKE 1 CAPSULE BY MOUTH DAILY BEFORE BREAKFAST. (Patient taking differently: as needed.) 30 capsule 3   metFORMIN (GLUCOPHAGE-XR) 500 MG 24 hr tablet Take 4 tablets (2,000 mg total) by mouth daily with breakfast. (Patient taking differently: Take 1,000 mg by mouth 2 (two) times daily.) 360 tablet 3   Microlet Lancets MISC 1 each by Does not apply route 2 (two) times daily. E11.9 100 each 2   nystatin (MYCOSTATIN) 100000 UNIT/ML suspension Take 5 mLs (500,000 Units total) by mouth 4 (four) times daily. 180 mL 1   pantoprazole (PROTONIX) 40 MG tablet Take 1 tablet (40 mg total) by mouth 2 (two) times daily. 180 tablet 3   telmisartan (MICARDIS) 40 MG tablet Take 1 tablet (40 mg total) by mouth daily. 90 tablet 3   venlafaxine XR (EFFEXOR-XR) 75 MG 24 hr capsule TAKE 1 CAPSULE BY MOUTH TWICE A DAY 180 capsule 3   benzonatate (TESSALON PERLES) 100 MG capsule Take 1-2 capsules (100-200 mg total) by mouth 3 (three) times daily as needed for cough. (Patient not taking: Reported on 02/25/2023) 30 capsule 0   chlorpheniramine-HYDROcodone (TUSSIONEX) 10-8 MG/5ML Take 5 mLs by mouth at bedtime as needed for cough (do not drive for 8 hours after taking). (Patient not taking: Reported on 02/25/2023) 115 mL 0   cyclobenzaprine (FLEXERIL) 5 MG tablet Take 1 tablet (5 mg total) by mouth 3 (three) times daily as needed for muscle spasms (or Headache). (Patient not taking: Reported on 02/25/2023) 30 tablet 0   ondansetron (ZOFRAN-ODT) 4 MG disintegrating tablet Take 1 tablet (4 mg total) by  mouth every 8 (eight) hours as needed for nausea or vomiting. (Patient not taking: Reported on 02/25/2023) 20 tablet 2   oxyCODONE-acetaminophen (PERCOCET) 5-325 MG tablet Take 1 tablet by mouth every 4 (four) hours as needed for severe pain. (Patient not taking: Reported on 02/25/2023) 30 tablet 0   No current facility-administered medications for this visit.    Allergies-reviewed and updated Allergies  Allergen Reactions   Firvanq [Vancomycin] Other (See Comments)    Dose related nephrotoxicity    Trexall [Methotrexate] Other (See Comments)    Peri-oral and buccal lesions   Zestril [Lisinopril] Cough   Chlorhexidine Itching   Cleocin [  Clindamycin] Nausea And Vomiting and Rash   Lincocin [Lincomycin Hcl] Nausea And Vomiting and Rash   Teflaro [Ceftaroline] Rash and Other (See Comments)    Tolerates ceftriaxone     Social History   Social History Narrative   Married 1994. 2 sons who both live close and 1 grandson.       Disability due to sarcoidosis. 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 Oropharynx normal Neck: no thyromegaly CV: RRR no murmurs rubs or gallops Lungs: CTAB no crackles, wheeze, rhonchi Abdomen: soft/nontender/nondistended/normal bowel sounds. No rebound or guarding.  Ext: no edema Skin: warm, dry Neuro: grossly normal, moves all extremities, PERRLA   Assessment and Plan   62 y.o. [name] presenting for annual physical.  Health Maintenance counseling: 1. Anticipatory guidance: Patient counseled regarding regular dental exams - wears dentures, eye exams - yearly in june,  avoiding smoking and second  hand smoke , limiting alcohol to 1 beverage per day- does not drink , no illicit drugs- none.   2. Risk factor reduction:  Advised patient of need for regular exercise and diet rich and fruits and vegetables to reduce risk of heart attack and stroke.  Exercise- trying to get back into [employer] after dealing with some hand pain and foot surgery.  Diet/weight management-from last physical down 14 lbs- about 18 months-she has been trying to eat better/lose weight- plus the recent nausea issues have contributed son.  Wt Readings from Last 3 Encounters:  02/25/23 211 lb 9.6 oz (96 kg)  02/17/23 213 lb 9.6 oz (96.9 kg)  02/10/23 215 lb (97.5 kg)  3. Immunizations/screenings/ancillary studies- COVID shot - reports having in the fall- recommended annual  Immunization History  Administered Date(s) Administered   H1N1 09/24/2008   Hep A / Hep B 02/20/2018, 04/03/2018, 08/23/2018   Influenza Inj Mdck Quad With Preservative 07/12/2019   Influenza Split 09/24/2008, 07/07/2011, 07/18/2012, 06/12/2013, 07/15/2014, 08/08/2015, 06/22/2016, 07/01/2017, 08/16/2017, 07/14/2018, 07/04/2019, 07/12/2019, 07/26/2019, 07/02/2020   Influenza Whole 08/15/2007, 06/26/2008, 07/23/2009, 07/16/2010   Influenza, Seasonal, Injecte, Preservative Fre 10/26/2011, 07/18/2012   Influenza,inj,Quad PF,6+ Mos 06/12/2013, 07/15/2014, 08/08/2015, 06/22/2016, 07/01/2017, 07/14/2018, 07/04/2019, 07/02/2020, 07/07/2021, 06/15/2022   Influenza-Unspecified 09/24/2008, 07/07/2011, 07/18/2012, 06/12/2013, 07/15/2014, 08/08/2015, 06/22/2016, 07/01/2017, 07/14/2018, 07/04/2019, 07/12/2019, 07/26/2019, 07/02/2020   PFIZER Comirnaty(Gray Top)Covid-19 Tri-Sucrose Vaccine 04/15/2021   PFIZER(Purple Top)SARS-COV-2 Vaccination 12/26/2019, 01/16/2020, 04/15/2021, 07/08/2021   Pneumococcal Conjugate-13 05/06/2014   Pneumococcal Polysaccharide-23 09/30/2008, 02/13/2013   Td 05/21/2010   Td (Adult), 2 Lf Tetanus Toxid, Preservative Free 05/21/2010    Tdap 05/21/2010, 07/02/2020   Zoster Recombinat (Shingrix) 03/02/2017, 05/02/2017   Zoster, Unspecified 03/02/2017, 05/02/2017  4. Cervical cancer screening- with GYN- 10/27/19 but sees regularly and was told 5 years for actual pap as human papilloma virus negative- report not linking 5. Breast cancer screening-  breast exam with GYN and mammogram 07/09/22 6. Colon cancer screening - 01/06/21 with 7 year repeat 7. Skin cancer screening- no dermatologist. advised regular sunscreen use. Denies worrisome, changing, or new skin lesions.  8. Birth control/STD check- only active with husband  and postmenopausal 9. Osteoporosis screening at 63- 11/01/19 reassuring but with prior steroid use we opted to update again today- may can do 5 years if reassuring 10. Smoking associated screening - never smoker  Status of chronic or acute concerns   #left foot surgery with DrLogan Bores for hammertoe- reports healing from this    #GFR III- advised to minimize NSAIDs and took meloxicam off list  # Depression/hot flashes S: Compliant with Wellbutrin 300 xr, Effexor xr 75 mg -prior paxil 7.5mg  (for hot flashes).     02/25/2023    8:43 AM 11/19/2022    1:09 PM 11/04/2022   11:04 AM  Depression screen PHQ 2/9  Decreased Interest 0 0 0  Down, Depressed, Hopeless 0 0 0  PHQ - 2 Score 0 0 0  Altered sleeping 0 0 0  Tired, decreased energy 0 0 0  Change in appetite 0 0 0  Feeling bad or failure about yourself  0 0 0  Trouble concentrating 0 0 0  Moving slowly or fidgety/restless 0 0 0  Suicidal thoughts 0 0 0  PHQ-9 Score 0 0 0  Difficult doing work/chores Not difficult at all Not difficult at all Not difficult at all  A/P: full remission- continue current medications   # hypertension/hypertensive heart disease with chronic diastolic CHF/CKD stage III S: compliant with Lasix 40 mg daily (also potassium 20 meq), diltiazem extended release 120 mg, carvedilol 25 mg twice daily- appears accidentally stopped on  list July 2023 for unknown reasons- she states no longer on this (could bring all medicines to next vist to check) ,  telmisartan 20 mg--> 40 mg on 12/10/22 -prior on imdur on list  Renal function stable with GFR of 50 BP Readings from Last 3 Encounters:  02/25/23 134/62  02/17/23 132/70  02/10/23 120/60  A/P: reasonable control for hypertension and hypertensive heart disease CHF- stable/euvolemic- weight down slightly, no edema   # hyperlipidemia S:compliant with atorvastatin 40 mg, fenofibrate 160mg .  Despite this triglycerides remain high and she was started on Vascepa by cardiology   she also takes aspirin for primary prevention Lab Results  Component Value Date   CHOL 137 10/12/2022   HDL 50.60 10/12/2022   LDLCALC 56 10/12/2022   LDLDIRECT 84.0 07/21/2021   TRIG 154.0 (H) 10/12/2022   CHOLHDL 3 10/12/2022  A/P: excellent control last visit- continue current medications   Aortic atherosclerosis (presumed stable)- LDL goal ideally <70 - continue current medications   #Sarcoidosis of lung- prior seen by [facility] #asthma S: Medication:  breo 200-25mg  1 puff every day.  off long term prednisone due to osteoporosis risk -Can get into some tough coughing spells when ill and requests tussionex generally at those times. Also uses flonase for allergies. Tessalon Perles also help A/P: thankfully stbale lalely- continue current medications   #Type 2 diabetes mellitus/morbid obesity #Diabetic polyneuropathy associated with type 2 diabetes mellitus #fatty liver - patient compliant with gabapentin 100mg  3x a day for neuropathy with extra 300 mg at night. Status stable/still somewhat bothersome. Plan - continue current medications  - follows with endocrinology and on insulin glargine and novolog. -also on Trulicity and meformin -LFTs have been norma- will continue to trend  Lab Results  Component Value Date   HGBA1C 8.2 (H) 12/13/2022   HGBA1C 8.1 (H) 10/12/2022   HGBA1C 8.4 (H) 04/29/2022   A/P: DM - hopefully improving- keep follow up with endocrinology and continue current medications Neuropathy stable continue current medications Fatty liver- with stable  LFTs and losing weight- continue her efforts Lab Results  Component Value Date   ALT 16 02/10/2023   AST 18 02/10/2023   ALKPHOS 74 02/10/2023   BILITOT 0.6 02/10/2023    #Gout # arthritic issues- s/p left total knee replacement. ongoing right knee OA S: 0 flares recently on allopurinol 300mg  A/P:gout has been doing well   # Cushingoid side effect of steroids- thankfully off steroids  #Gerd- on high dose dexilant in past- now on pantoprazole 40 mg due to coverage issues- occasional heartburn with this- Tums helpful. 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 Noted - trend cbc. Has seen oncology in past for positive SPEP but ultimately thought related to sarcoid . iron infusions at times with hematology Lab Results  Component Value Date   FERRITIN 223 02/12/2022   #B12 deficiency S: discovered by Jarold Motto PA <200. doing once weekly injections for 6 weeks and then once a month after that- has stopped B12 though- so we will recheck Lab Results  Component Value Date   VITAMINB12 433 08/24/2021  A/P:concern could be low- update today   #Constipation- linzess through gastroenterology but not needing right now   Recommended follow up: Return in about 4 months (around 06/28/2023) for followup or sooner if needed.Schedule b4 you leave. Future Appointments  Date Time Provider Department Center  03/15/2023 11:30 AM [facility] ANNUAL WELLNESS VISIT 1 [facility]  03/21/2023  9:20 AM Motwani, Komal, MD [facility] None   Lab/Order associations: fasting   ICD-10-CM   1. Preventative health care  Z00.00     2. Major depressive disorder, recurrent, in full remission (HCC) Chronic F33.42     3. Aortic atherosclerosis (HCC) Chronic I70.0     4.  Type 2 diabetes mellitus with stage 3a chronic kidney disease, with long-term current use of insulin (HCC)  E11.22 Microalbumin / creatinine urine ratio   N18.31    Z79.4     5. Primary hypertension  I10     6. Hyperlipidemia associated with type 2 diabetes mellitus (HCC)  E11.69    E78.5     7. Deficiency anemia  D53.9 CBC with Differential/Platelet    IBC + Ferritin    8. Postmenopausal  Z78.0 DG Bone Density    9. B12 deficiency  E53.8 Vitamin B12     No orders of the defined types were placed in this encounter.  Return precautions advised.  Tana Conch, MD

## 2023-02-28 ENCOUNTER — Encounter: Payer: Medicare Other | Admitting: Family Medicine

## 2023-02-28 DIAGNOSIS — M25532 Pain in left wrist: Secondary | ICD-10-CM | POA: Diagnosis not present

## 2023-03-01 ENCOUNTER — Telehealth: Payer: Self-pay | Admitting: Family Medicine

## 2023-03-01 NOTE — Telephone Encounter (Signed)
Pt would like a call back with lab results 

## 2023-03-02 NOTE — Telephone Encounter (Signed)
Called and spoke with pt and labs reviewed, transferred to front for scheduling b12 injections.

## 2023-03-03 ENCOUNTER — Ambulatory Visit (INDEPENDENT_AMBULATORY_CARE_PROVIDER_SITE_OTHER)
Admission: RE | Admit: 2023-03-03 | Discharge: 2023-03-03 | Disposition: A | Discharge status: Home / Self Care | Payer: Medicare Other | Source: Ambulatory Visit

## 2023-03-03 ENCOUNTER — Ambulatory Visit (INDEPENDENT_AMBULATORY_CARE_PROVIDER_SITE_OTHER): Payer: Medicare Other

## 2023-03-03 DIAGNOSIS — E538 Deficiency of other specified B group vitamins: Secondary | ICD-10-CM

## 2023-03-03 DIAGNOSIS — Z780 Asymptomatic menopausal state: Secondary | ICD-10-CM

## 2023-03-03 MED ORDER — CYANOCOBALAMIN 1000 MCG/ML IJ SOLN
1000.0000 ug | Freq: Once | INTRAMUSCULAR | Status: AC
Start: 2023-03-03 — End: 2023-03-03
  Administered 2023-03-03: 1000 ug via INTRAMUSCULAR

## 2023-03-03 NOTE — Progress Notes (Signed)
Pt here for B12 injection for Dr. Hunter. Injection given in right deltoid. Pt tolerated well.   

## 2023-03-04 DIAGNOSIS — Z780 Asymptomatic menopausal state: Secondary | ICD-10-CM | POA: Diagnosis not present

## 2023-03-10 ENCOUNTER — Ambulatory Visit: Payer: Medicare Other

## 2023-03-10 ENCOUNTER — Ambulatory Visit (INDEPENDENT_AMBULATORY_CARE_PROVIDER_SITE_OTHER): Payer: Medicare Other

## 2023-03-10 DIAGNOSIS — M65312 Trigger thumb, left thumb: Secondary | ICD-10-CM | POA: Diagnosis not present

## 2023-03-10 DIAGNOSIS — M654 Radial styloid tenosynovitis [de Quervain]: Secondary | ICD-10-CM | POA: Diagnosis not present

## 2023-03-10 DIAGNOSIS — M1812 Unilateral primary osteoarthritis of first carpometacarpal joint, left hand: Secondary | ICD-10-CM | POA: Insufficient documentation

## 2023-03-10 DIAGNOSIS — R202 Paresthesia of skin: Secondary | ICD-10-CM | POA: Diagnosis not present

## 2023-03-10 DIAGNOSIS — E538 Deficiency of other specified B group vitamins: Secondary | ICD-10-CM

## 2023-03-10 DIAGNOSIS — R200 Anesthesia of skin: Secondary | ICD-10-CM | POA: Diagnosis not present

## 2023-03-10 DIAGNOSIS — M25532 Pain in left wrist: Secondary | ICD-10-CM | POA: Diagnosis not present

## 2023-03-10 MED ORDER — CYANOCOBALAMIN 1000 MCG/ML IJ SOLN
1000.0000 ug | Freq: Once | INTRAMUSCULAR | Status: AC
Start: 2023-03-10 — End: 2023-03-10
  Administered 2023-03-10: 1000 ug via INTRAMUSCULAR

## 2023-03-10 NOTE — Progress Notes (Signed)
Pt here for B12 injection for Dr. Hunter. Injection given in left deltoid. Pt tolerated well.   

## 2023-03-15 ENCOUNTER — Ambulatory Visit (INDEPENDENT_AMBULATORY_CARE_PROVIDER_SITE_OTHER): Payer: BC Managed Care – PPO

## 2023-03-15 VITALS — BP 140/68 | HR 93 | Temp 97.8°F | Wt 219.6 lb

## 2023-03-15 DIAGNOSIS — Z0000 Encounter for general adult medical examination without abnormal findings: Secondary | ICD-10-CM

## 2023-03-15 NOTE — Progress Notes (Addendum)
Subjective:   [name] is a 61 y.o. female who presents for Medicare Annual (Subsequent) preventive examination.  Patient Medicare AWV questionnaire was completed by the patient on 03/14/23; I have confirmed that all information answered by patient is correct and no changes since this date.      Review of Systems     Cardiac Risk Factors include: advanced age (>24men, >48 women);dyslipidemia;hypertension;obesity (BMI >30kg/m2);diabetes mellitus     Objective:    Today's Vitals   03/15/23 1122 03/15/23 1143  BP: (!) 150/84 (!) 140/68  Pulse: 93   Temp: 97.8 F (36.6 C)   SpO2: 93%   Weight: 219 lb 9.6 oz (99.6 kg)    Body mass index is 35.44 kg/m.     03/15/2023   11:34 AM 11/10/2022    3:13 AM 11/09/2022    6:04 PM 08/30/2022    9:00 AM 06/05/2022    6:33 PM 02/15/2022    9:29 AM 11/17/2021    8:13 PM  Advanced Directives  Does Patient Have a Medical Advance Directive? No No No No No Yes No  Type of Holiday representative of Healthcare Power of Attorney in Chart?      No - copy requested   Would patient like information on creating a medical advance directive? No - Patient declined No - Patient declined No - Patient declined No - Guardian declined No - Patient declined  No - Patient declined    Current Medications (verified) Outpatient Encounter Medications as of 03/15/2023  Medication Sig   albuterol (PROVENTIL) (2.5 MG/3ML) 0.083% nebulizer solution Take 2.5 mg by nebulization every 6 (six) hours as needed for wheezing or shortness of breath.   allopurinol (ZYLOPRIM) 300 MG tablet Take 1 tablet (300 mg total) by mouth daily.   aspirin EC 81 MG tablet Take 1 tablet (81 mg total) by mouth daily.   atorvastatin (LIPITOR) 40 MG tablet TAKE 1 TABLET BY MOUTH EVERY DAY (Patient taking differently: Take 40 mg by mouth daily.)   benzonatate (TESSALON PERLES) 100 MG capsule Take 1-2 capsules (100-200 mg total) by mouth 3 (three) times daily  as needed for cough.   BREO ELLIPTA 200-25 MCG/INH AEPB TAKE 1 PUFF BY MOUTH EVERY DAY (Patient taking differently: Inhale 1 puff into the lungs daily.)   buPROPion (WELLBUTRIN XL) 300 MG 24 hr tablet TAKE 1 TABLET BY MOUTH EVERY DAY   chlorpheniramine-HYDROcodone (TUSSIONEX) 10-8 MG/5ML Take 5 mLs by mouth at bedtime as needed for cough (do not drive for 8 hours after taking).   Continuous Glucose Sensor (DEXCOM G7 SENSOR) MISC 1 DEVICE BY DOES NOT APPLY ROUTE AS DIRECTED. CHANGE SENSOR EVERY 10 DAYS   cyclobenzaprine (FLEXERIL) 5 MG tablet Take 1 tablet (5 mg total) by mouth 3 (three) times daily as needed for muscle spasms (or Headache).   diltiazem (CARDIZEM CD) 120 MG 24 hr capsule Take 1 capsule (120 mg total) by mouth daily.   Dulaglutide (TRULICITY) 4.5 MG/0.5ML SOPN Inject 4.5 mg as directed once a week. (Patient taking differently: Inject 4.5 mg as directed every Monday.)   estradiol (ESTRACE) 0.1 MG/GM vaginal cream Place 0.5 Applicatorfuls vaginally 2 (two) times a week.   fenofibrate 160 MG tablet TAKE 1 TABLET BY MOUTH EVERY DAY   fluticasone (FLONASE) 50 MCG/ACT nasal spray Place 2 sprays into both nostrils daily as needed for allergies. (Patient taking differently: Place 1 spray into both nostrils daily.)  furosemide (LASIX) 40 MG tablet Take 1 tablet (40 mg total) by mouth daily.   gabapentin (NEURONTIN) 300 MG capsule Take at night in addition to the 100mg  gabapentin to get 400mg  at night and 100mg  in the morning and at mid day. (Patient taking differently: Take 300 mg by mouth at bedtime as needed (pain, neuropathy).)   glucose blood (CONTOUR NEXT TEST) test strip 1 each by Other route 4 (four) times daily. And lancets 4/day   ibuprofen (ADVIL) 800 MG tablet Take 1 tablet (800 mg total) by mouth 3 (three) times daily.   insulin aspart (NOVOLOG FLEXPEN) 100 UNIT/ML FlexPen Inject 15 Units into the skin daily with supper. (Patient taking differently: Inject 8-14 Units into the skin  See admin instructions. Inject 8 units before breakfast, 10 units before lunch, 14 units before supper.)   insulin glargine (LANTUS SOLOSTAR) 100 UNIT/ML Solostar Pen Inject 16 Units into the skin every morning. (Patient taking differently: Inject 22 Units into the skin daily.)   Insulin Pen Needle (PEN NEEDLES) 32G X 4 MM MISC 1 each by Does not apply route 4 (four) times daily. E11.9   ipratropium (ATROVENT) 0.06 % nasal spray PLACE 2 SPRAYS INTO BOTH NOSTRILS 4 TIMES DAILY. (Patient taking differently: Place 1-2 sprays into both nostrils daily as needed for rhinitis.)   KLOR-CON M20 20 MEQ tablet TAKE 1 & 1/2 TABLETS BY MOUTH TWICE A DAY   Lancet Devices (EASY MINI EJECT LANCING DEVICE) MISC Use as directed 4 times daily to test blood sugar   LINZESS 290 MCG CAPS capsule TAKE 1 CAPSULE BY MOUTH DAILY BEFORE BREAKFAST. (Patient taking differently: as needed.)   metFORMIN (GLUCOPHAGE-XR) 500 MG 24 hr tablet Take 4 tablets (2,000 mg total) by mouth daily with breakfast. (Patient taking differently: Take 1,000 mg by mouth 2 (two) times daily.)   Microlet Lancets MISC 1 each by Does not apply route 2 (two) times daily. E11.9   nystatin (MYCOSTATIN) 100000 UNIT/ML suspension Take 5 mLs (500,000 Units total) by mouth 4 (four) times daily.   ondansetron (ZOFRAN-ODT) 4 MG disintegrating tablet Take 1 tablet (4 mg total) by mouth every 8 (eight) hours as needed for nausea or vomiting.   oxyCODONE-acetaminophen (PERCOCET) 5-325 MG tablet Take 1 tablet by mouth every 4 (four) hours as needed for severe pain.   pantoprazole (PROTONIX) 40 MG tablet Take 1 tablet (40 mg total) by mouth 2 (two) times daily.   telmisartan (MICARDIS) 40 MG tablet Take 1 tablet (40 mg total) by mouth daily.   venlafaxine XR (EFFEXOR-XR) 75 MG 24 hr capsule TAKE 1 CAPSULE BY MOUTH TWICE A DAY   [DISCONTINUED] mupirocin nasal ointment (BACTROBAN) 2 % Place 1 application into the nose 2 (two) times daily. Use one-half of tube in each  nostril twice daily for five (5) days. After application, press sides of nose together and gently massage.   No facility-administered encounter medications on file as of 03/15/2023.    Allergies (verified) Firvanq [vancomycin], Trexall [methotrexate], Zestril [lisinopril], Chlorhexidine, Cleocin [clindamycin], Lincocin [lincomycin hcl], and Teflaro [ceftaroline]   History: Past Medical History:  Diagnosis Date   Abnormal SPEP 04/17/2014   Acute left ankle pain 01/26/2017   ANEMIA-UNSPECIFIED 09/18/2009   Anxiety    Arthritis    Blood transfusion without reported diagnosis    Cataract    CHF (congestive heart failure) (HCC)    Chronic diastolic heart failure, NYHA class 2 (HCC)    Normal LVEDP by May 2018   COPD (chronic obstructive  pulmonary disease) (HCC)    Depression    DIABETES MELLITUS, TYPE II 08/21/2006   Diabetic osteomyelitis (HCC) 05/29/2015   Fatty liver    Fracture of 5th metatarsal    non union   GERD 08/21/2006   Had fundoplication   GOUT 08/20/2010   Hammer toe of right foot    2-5th toes   Hx of umbilical hernia repair    HYPERLIPIDEMIA 08/21/2006   HYPERTENSION 08/21/2006   Infection of wound due to methicillin resistant Staphylococcus aureus (MRSA)    Internal hemorrhoids    Kidney problem    Multiple allergies 10/14/2016   OBESITY 06/04/2009   Onychomycosis 10/27/2015   Osteomyelitis of left foot (HCC) 05/29/2015   Osteoporosis    Pulmonary sarcoidosis (HCC)    Followed locally by pulmonology, but also by Dr. Sandy Sal[facility]am at [facility]   Right knee pain 01/26/2017   Vocal cord dysfunction    Wears partial dentures    Past Surgical History:  Procedure Laterality Date   ABDOMINAL HYSTERECTOMY     APPENDECTOMY     BLADDER SUSPENSION  11/11/2011   Procedure: TRANSVAGINAL TAPE (TVT) PROCEDURE;  Surgeon: Levi Aland, MD;  Location: [facility];  Service: Gynecology;  Laterality: N/A;   CAROTIDS  02/18/2011   CAROTID DUPLEX; VERTEBRALS ARE PATENT WITH  ANTEGRADE FLOW. ICA/CCA RATIO 1.61 ON RIGHT AND 0.75 ON LEFT   CATARACT EXTRACTION, BILATERAL     CHOLECYSTECTOMY  1984   COLONOSCOPY  04/29/2010   Marina Goodell   CYSTOSCOPY  11/11/2011   Procedure: CYSTOSCOPY;  Surgeon: Levi Aland, MD;  Location: [facility];  Service: Gynecology;  Laterality: N/A;   EXTUBATION (ENDOTRACHEAL) IN OR N/A 09/23/2017   Procedure: EXTUBATION (ENDOTRACHEAL) IN OR;  Surgeon: Graylin Shiver, MD;  Location: [facility];  Service: ENT;  Laterality: N/A;   FIBEROPTIC LARYNGOSCOPY AND TRACHEOSCOPY N/A 09/23/2017   Procedure: FLEXIBLE FIBEROPTIC LARYNGOSCOPY;  Surgeon: Graylin Shiver, MD;  Location: [facility];  Service: ENT;  Laterality: N/A;   FRACTURE SURGERY     foot   HALLUX FUSION Left 10/02/2018   Procedure: HALLUX ARTHRODESIS;  Surgeon: Felecia Shelling, DPM;  Location: [facility];  Service: Podiatry;  Laterality: Left;   HAMMER TOE SURGERY Left 10/02/2018   Procedure: HAMMER TOE CORRECTION 2ND 3RD 4RD FIFTH TOE;  Surgeon: Felecia Shelling, DPM;  Location: [facility];  Service: Podiatry;  Laterality: Left;   HAMMER TOE SURGERY Right 04/12/2019   Procedure: HAMMER TOE CORRECTION, 2ND, 3RD, 4TH AND 5TH TOES OF RIGHT FOOT;  Surgeon: Felecia Shelling, DPM;  Location[facility];  Service: Podiatry;  Laterality: Right;   HAMMER TOE SURGERY Left 10/25/2019   Procedure: HAMMER TOE REPAIR SECOND, THIRD, FOUTH;  Surgeon: Felecia Shelling, DPM;  Location: [facility];  Service: Podiatry;  Laterality: Left;   HERNIA REPAIR     I & D EXTREMITY Left 06/27/2015   Procedure: Partial Excision Left Calcaneus, Place Antibiotic Beads, and Wound VAC;  Surgeon: Nadara Mustard, MD;  Location: [facility];  Service: Orthopedics;  Laterality: Left;   JOINT REPLACEMENT     KNEE ARTHROSCOPY     right   LEFT AND RIGHT HEART CATHETERIZATION WITH CORONARY ANGIOGRAM N/A 04/23/2013   Procedure: LEFT AND RIGHT HEART CATHETERIZATION WITH CORONARY ANGIOGRAM;  Surgeon: Marykay Lex, MD;  Location: [facility] CATH LAB;  Service:  Cardiovascular;  Laterality: N/A;   LEFT HEART CATH AND CORONARY ANGIOGRAPHY N/A 03/11/2017   Procedure: Left Heart Cath and  Coronary Angiography;  Surgeon: Tonny Bollman, MD;  Location: [facility] INVASIVE CV LAB; angiographically minimal CAD in the LAD otherwise normal.;  Normal LVEDP.  FALSE POSITIVE MYOVIEW   LEFT HEART CATH AND CORONARY ANGIOGRAPHY  07/20/2010   LVEF 50-55% WITH VERY MILD GLOBAL HYPOKINESIA; ESSENTIALLY NORMAL CORONARY ARTERIES; NORMAL LV FUNCTION   METATARSAL OSTEOTOMY WITH OPEN REDUCTION INTERNAL FIXATION (ORIF) METATARSAL WITH FUSION Left 04/09/2014   Procedure: LEFT FOOT FRACTURE OPEN TREATMENT METATARSAL INCLUDES INTERNAL FIXATION EACH;  Surgeon: Nilda Simmer, MD;  Location: [facility];  Service: Orthopedics;  Laterality: Left;   NISSEN FUNDOPLICATION  2004   NM MYOVIEW LTD --> FALSE POSITIVE  03/10/2017   : Moderate size "stress-induced "perfusion defect at the apex as well as "ill-defined stress-induced perfusion defect in the lateral wall.  EF 55%.  INTERMEDIATE risk. -->  FALSE POSITIVE   ORIF DISTAL FEMUR FRACTURE  08/08/2020   [facility] [place]: ORIF of left extra-articular periprosthetic distal femur fracture with synthesis of the femur plate with cortical nonlocking screws.  (Thought to be pathologic fracture due to osteoporosis) -> after a fall   ORIF FEMUR W/ PERI-IMPLANT  08/29/2020   [facility]) revision of left femur fracture ORIF plate fixation screws; culture positive for MSSA   Right and left CARDIAC CATHETERIZATION  04/23/2013   Angiographic normal coronaries; LVEDP 20 mmHg, PCWP 12-14 mmHg, RAP 12 mmHg.; Fick CO/CI 4.9/2.2   ROTATOR CUFF REPAIR Right 12/09/2021   SHOULDER ARTHROSCOPY Left 03/14/2019   Procedure: LEFT SHOULDER ARTHROSCOPY, DEBRIDEMENT, AND DECOMPRESSION;  Surgeon: Nadara Mustard, MD;  Location[facility];  Service: Orthopedics;  Laterality: Left;   TOTAL KNEE ARTHROPLASTY Right 06/29/2017   Procedure: RIGHT  TOTAL KNEE ARTHROPLASTY;  Surgeon: Nadara Mustard, MD;  [facility];  Service: Orthopedics;  Laterality: Right;   TOTAL KNEE ARTHROPLASTY Left 12/07/2017   TOTAL KNEE ARTHROPLASTY Left 12/07/2017   Procedure: LEFT TOTAL KNEE ARTHROPLASTY;  Surgeon: Nadara Mustard, MD;  [facility] OR;  Service: Orthopedics;  Laterality: Left;   TRACHEOSTOMY TUBE PLACEMENT N/A 09/20/2017   Procedure: AWAKE INTUBATION WITH ANESTHESIA WITH VIDEO ASSISTANCE;  Surgeon: Graylin Shiver, MD;  Location[facility];  Service: ENT;  Laterality: N/A;   TRANSTHORACIC ECHOCARDIOGRAM  08/2014   Normal LV size and function.  Mild LVH.  EF 55-60%.  Normal regional wall motion.  GR 1 DD.  Normal RV size and function .   TRANSTHORACIC ECHOCARDIOGRAM  08/12/2020    [facility]): Normal LV size and function.  EF 55 to 60%.  No RWM A.  Normal filling pattern.  Normal RV.  No significant valvular disease-stenosis or regurgitation.  No vegetation.   TUBAL LIGATION     with reversal in 1994   UPPER GASTROINTESTINAL ENDOSCOPY     VENTRAL HERNIA REPAIR     Family History  Problem Relation Age of Onset   Diabetes Father    Heart attack Father 16   Coronary artery disease Father    Heart failure Father    Throat cancer Father    Hypertension Father    Heart disease Father    Sleep apnea Father    Obesity Father    COPD Mother    Emphysema Mother    Asthma Mother    Heart failure Mother    Breast cancer Mother    Diabetes Mother    Kidney disease Mother    Thyroid disease Mother    Heart attack  Maternal Grandfather    Sarcoidosis Maternal Uncle    Lung cancer Brother    Diabetes Brother    Colon cancer Neg Hx    Colon polyps Neg Hx    Esophageal cancer Neg Hx    Rectal cancer Neg Hx    Stomach cancer Neg Hx    Social History   Socioeconomic History   Marital status: Married    Spouse name: DONNAH SCICCHITANO   Number of children: 2   Years of education: 12   Highest education level: Not on file   Occupational History   Occupation: DISABLED  Tobacco Use   Smoking status: Never   Smokeless tobacco: Never  Vaping Use   Vaping Use: Never used  Substance and Sexual Activity   Alcohol use: No    Alcohol/week: 0.0 standard drinks of alcohol   Drug use: No   Sexual activity: Yes    Birth control/protection: Surgical  Other Topics Concern   Not on file  Social History Narrative   Married 1994. 2 sons who both live close and 1 grandson.       Disability due to sarcoidosis. 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 [employer]   Social Determinants of Health   Financial Resource Strain: Low Risk  (03/14/2023)   Overall Financial Resource Strain (CARDIA)    Difficulty of Paying Living Expenses: Not hard at all  Food Insecurity: No Food Insecurity (03/14/2023)   Hunger Vital Sign    Worried About Running Out of Food in the Last Year: Never true    Ran Out of Food in the Last Year: Never true  Transportation Needs: No Transportation Needs (03/14/2023)   PRAPARE - Administrator, Civil Service (Medical): No    Lack of Transportation (Non-Medical): No  Physical Activity: Insufficiently Active (03/14/2023)   Exercise Vital Sign    Days of Exercise per Week: 3 days    Minutes of Exercise per Session: 30 min  Stress: No Stress Concern Present (03/14/2023)   [employer] of Occupational Health - Occupational Stress Questionnaire    Feeling of Stress : Not at all  Social Connections: Socially Integrated (03/14/2023)   Social Connection and Isolation Panel [NHANES]    Frequency of Communication with Friends and Family: More than three times a week    Frequency of Social Gatherings with Friends and Family: More than three times a week    Attends Religious Services: More than 4 times per year    Active Member of [employer] or Organizations: Yes    Attends  Banker Meetings: 1 to 4 times per year    Marital Status: Married    Tobacco Counseling Counseling given: Not Answered   Clinical Intake:  Pre-visit preparation completed: Yes  Pain : No/denies pain     BMI - recorded: 35.44 Nutritional Status: BMI > 30  Obese Diabetes: Yes CBG done?: Yes (230 per pt) CBG resulted in Enter/ Edit results?: No Did pt. bring in CBG monitor from home?: No  How often do you need to have someone help you when you read instructions, pamphlets, or other written materials from your doctor or pharmacy?: 1 - Never  Diabetic?Nutrition Risk Assessment:  Has the patient had any N/V/D within the last 2 months?  Yes  Does the patient have any non-healing wounds?  No  Has  the patient had any unintentional weight loss or weight gain?  No   Diabetes:  Is the patient diabetic?  Yes  If diabetic, was a CBG obtained today?  Yes  Did the patient bring in their glucometer from home?  No  How often do you monitor your CBG's? Daily dexacom .   Financial Strains and Diabetes Management:  Are you having any financial strains with the device, your supplies or your medication? No .  Does the patient want to be seen by Chronic Care Management for management of their diabetes?  No  Would the patient like to be referred to a Nutritionist or for Diabetic Management?  No   Diabetic Exams:  Diabetic Eye Exam: Completed 03/29/22 Diabetic Foot Exam: Completed 11/19/22   Interpreter Needed?: No  Information entered by :: Lanier Ensign, LPN   Activities of Daily Living    03/14/2023    9:35 PM 11/10/2022    3:26 AM  In your present state of health, do you have any difficulty performing the following activities:  Hearing? 0   Vision? 0   Difficulty concentrating or making decisions? 0   Walking or climbing stairs? 0   Dressing or bathing? 0   Doing errands, shopping? 0 0  Preparing Food and eating ? N   Using the Toilet? N   In the past six  months, have you accidently leaked urine? N   Do you have problems with loss of bowel control? N   Managing your Medications? N   Managing your Finances? N   Housekeeping or managing your Housekeeping? N     Patient Care Team: Shelva Majestic, MD as PCP - General (Family Medicine) Marykay Lex, MD as PCP - Cardiology (Cardiology) Storm Frisk, MD (Pulmonary Disease) Salvatore Marvel, MD as Consulting Physician (Orthopedic Surgery) Dellis Filbert, MD as Referring Physician (Pulmonary Disease) Antony Contras, MD as Consulting Physician (Ophthalmology) Sedalia Muta, PT as Physical Therapist (Physical Therapy) Rande Brunt, MD as Consulting Physician (Obstetrics and Gynecology) Felecia Shelling, DPM as Consulting Physician (Podiatry)  Indicate any recent Medical Services you may have received from other than [facility] providers in the past year (date may be approximate).     Assessment:   This is a routine wellness examination for [place].  Hearing/Vision screen Hearing Screening - Comments:: Pt denies any hearing issues  Vision Screening - Comments:: Pt follows up with Dr Randon Goldsmith for annual eye exam  Dietary issues and exercise activities discussed: Current Exercise Habits: Home exercise routine, Type of exercise: walking;Other - see comments, Time (Minutes): 30, Frequency (Times/Week): 3, Weekly Exercise (Minutes/Week): 90   Goals Addressed             This Visit's Progress    Patient Stated       Lose weight        Depression Screen    03/15/2023   11:32 AM 02/25/2023    8:43 AM 11/19/2022    1:09 PM 11/04/2022   11:04 AM 08/30/2022   10:00 AM 05/24/2022   11:50 AM 03/15/2022    9:06 AM  PHQ 2/9 Scores  PHQ - 2 Score 0 0 0 0 0 0 0  PHQ- 9 Score 0 0 0 0  0     Fall Risk    03/14/2023    9:35 PM 02/25/2023    8:43 AM 12/10/2022    3:56 PM 11/19/2022    1:08 PM 11/04/2022   11:08 AM  Fall Risk  Falls in the past year? 1 1 1  0 0  Number falls in past yr:  0 0 1 0 0  Injury with Fall? 1 1 1  0 0  Comment left hand      Risk for fall due to : Impaired vision History of fall(s) History of fall(s) No Fall Risks No Fall Risks  Follow up Falls prevention discussed Falls evaluation completed Falls evaluation completed Falls evaluation completed Falls evaluation completed;Education provided    FALL RISK PREVENTION PERTAINING TO THE HOME:  Any stairs in or around the home? Yes  If so, are there any without handrails? No  Home free of loose throw rugs in walkways, pet beds, electrical cords, etc? Yes  Adequate lighting in your home to reduce risk of falls? Yes   ASSISTIVE DEVICES UTILIZED TO PREVENT FALLS:  Life alert? No  Use of a cane, walker or w/c? No  Grab bars in the bathroom? Yes  Shower chair or bench in shower? No  Elevated toilet seat or a handicapped toilet? No   TIMED UP AND GO:  Was the test performed? Yes .  Length of time to ambulate 10 feet: 10 sec.   Gait steady and fast without use of assistive device  Cognitive Function:        03/15/2023   11:36 AM 02/15/2022    9:32 AM 02/09/2021    9:48 AM 02/01/2020    1:43 PM  6CIT Screen  What Year? 0 points 0 points 0 points 0 points  What month? 0 points 0 points 0 points 0 points  What time? 0 points 0 points  0 points  Count back from 20 0 points 0 points 0 points 0 points  Months in reverse 0 points 2 points 0 points 0 points  Repeat phrase 0 points 0 points 2 points 0 points  Total Score 0 points 2 points  0 points    Immunizations Immunization History  Administered Date(s) Administered   H1N1 09/24/2008   Hep A / Hep B 02/20/2018, 04/03/2018, 08/23/2018   Influenza Inj Mdck Quad With Preservative 07/12/2019   Influenza Split 09/24/2008, 07/07/2011, 07/18/2012, 06/12/2013, 07/15/2014, 08/08/2015, 06/22/2016, 07/01/2017, 08/16/2017, 07/14/2018, 07/04/2019, 07/12/2019, 07/26/2019, 07/02/2020   Influenza Whole 08/15/2007, 06/26/2008, 07/23/2009, 07/16/2010   Influenza,  Seasonal, Injecte, Preservative Fre 10/26/2011, 07/18/2012   Influenza,inj,Quad PF,6+ Mos 06/12/2013, 07/15/2014, 08/08/2015, 06/22/2016, 07/01/2017, 07/14/2018, 07/04/2019, 07/02/2020, 07/07/2021, 06/15/2022   Influenza-Unspecified 09/24/2008, 07/07/2011, 07/18/2012, 06/12/2013, 07/15/2014, 08/08/2015, 06/22/2016, 07/01/2017, 07/14/2018, 07/04/2019, 07/12/2019, 07/26/2019, 07/02/2020   PFIZER Comirnaty(Gray Top)Covid-19 Tri-Sucrose Vaccine 04/15/2021   PFIZER(Purple Top)SARS-COV-2 Vaccination 12/26/2019, 01/16/2020, 04/15/2021, 07/08/2021   Pneumococcal Conjugate-13 05/06/2014   Pneumococcal Polysaccharide-23 09/30/2008, 02/13/2013   Td 05/21/2010   Td (Adult), 2 Lf Tetanus Toxid, Preservative Free 05/21/2010   Tdap 05/21/2010, 07/02/2020   Zoster Recombinat (Shingrix) 03/02/2017, 05/02/2017   Zoster, Unspecified 03/02/2017, 05/02/2017    TDAP status: Up to date  Flu Vaccine status: Up to date  Pneumococcal vaccine status: Up to date  Covid-19 vaccine status: Completed vaccines  Qualifies for Shingles Vaccine? Yes   Zostavax completed Yes   Shingrix Completed?: Yes  Screening Tests Health Maintenance  Topic Date Due   COVID-19 Vaccine (5 - 2023-24 season) 06/11/2022   OPHTHALMOLOGY EXAM  03/30/2023   INFLUENZA VACCINE  05/12/2023   HEMOGLOBIN A1C  06/15/2023   MAMMOGRAM  07/10/2023   FOOT EXAM  11/20/2023   Diabetic kidney evaluation - eGFR measurement  02/10/2024   Diabetic kidney evaluation - Urine ACR  02/25/2024  Medicare Annual Wellness (AWV)  03/14/2024   PAP SMEAR-Modifier  10/16/2024   DEXA SCAN  03/02/2026   Colonoscopy  01/07/2028   DTaP/Tdap/Td (4 - Td or Tdap) 07/02/2030   Hepatitis C Screening  Completed   HIV Screening  Completed   Zoster Vaccines- Shingrix  Completed   Pneumococcal Vaccine 72-1 Years old  Aged Out   HPV VACCINES  Aged Out    Health Maintenance  Health Maintenance Due  Topic Date Due   COVID-19 Vaccine (5 - 2023-24 season)  06/11/2022    Colorectal cancer screening: Type of screening: Colonoscopy. Completed 01/06/21. Repeat every 7 years  Mammogram status: Completed 07/09/22. Repeat every year  Bone Density status: Completed 03/03/23. Results reflect: Bone density results: OSTEOPENIA. Repeat every 2 years.  Additional Screening:  Hepatitis C Screening:  Completed 03/23/16  Vision Screening: Recommended annual ophthalmology exams for early detection of glaucoma and other disorders of the eye. Is the patient up to date with their annual eye exam?  Yes  Who is the provider or what is the name of the office in which the patient attends annual eye exams? Dr Randon Goldsmith  If pt is not established with a provider, would they like to be referred to a provider to establish care? No .   Dental Screening: Recommended annual dental exams for proper oral hygiene  Community Resource Referral / Chronic Care Management: CRR required this visit?  No   CCM required this visit?  No      Plan:     I have personally reviewed and noted the following in the patient's chart:   Medical and social history Use of alcohol, tobacco or illicit drugs  Current medications and supplements including opioid prescriptions. Patient is currently taking opioid prescriptions. Information provided to patient regarding non-opioid alternatives. Patient advised to discuss non-opioid treatment plan with their provider. Functional ability and status Nutritional status Physical activity Advanced directives List of other physicians Hospitalizations, surgeries, and ER visits in previous 12 months Vitals Screenings to include cognitive, depression, and falls Referrals and appointments  In addition, I have reviewed and discussed with patient certain preventive protocols, quality metrics, and best practice recommendations. A written personalized care plan for preventive services as well as general preventive health recommendations were provided to  patient.     Marzella Schlein, LPN   10/16/1094   Nurse Notes: none

## 2023-03-16 DIAGNOSIS — M25532 Pain in left wrist: Secondary | ICD-10-CM | POA: Diagnosis not present

## 2023-03-17 ENCOUNTER — Ambulatory Visit (INDEPENDENT_AMBULATORY_CARE_PROVIDER_SITE_OTHER): Payer: Medicare Other

## 2023-03-17 ENCOUNTER — Ambulatory Visit: Payer: Medicare Other

## 2023-03-17 DIAGNOSIS — E538 Deficiency of other specified B group vitamins: Secondary | ICD-10-CM | POA: Diagnosis not present

## 2023-03-17 MED ORDER — CYANOCOBALAMIN 1000 MCG/ML IJ SOLN
1000.0000 ug | Freq: Once | INTRAMUSCULAR | Status: AC
Start: 2023-03-17 — End: 2023-03-17
  Administered 2023-03-17: 1000 ug via INTRAMUSCULAR

## 2023-03-17 NOTE — Progress Notes (Signed)
[name] here for her B12 injection for DR. Hunter. Injection given in right deltoid. Pt tolerated well.

## 2023-03-21 ENCOUNTER — Ambulatory Visit (INDEPENDENT_AMBULATORY_CARE_PROVIDER_SITE_OTHER): Payer: Medicare Other | Admitting: "Endocrinology

## 2023-03-21 ENCOUNTER — Encounter: Payer: Self-pay | Admitting: "Endocrinology

## 2023-03-21 VITALS — BP 144/96 | HR 87 | Ht 66.0 in | Wt 214.8 lb

## 2023-03-21 DIAGNOSIS — Z794 Long term (current) use of insulin: Secondary | ICD-10-CM | POA: Diagnosis not present

## 2023-03-21 DIAGNOSIS — E782 Mixed hyperlipidemia: Secondary | ICD-10-CM | POA: Diagnosis not present

## 2023-03-21 DIAGNOSIS — E1165 Type 2 diabetes mellitus with hyperglycemia: Secondary | ICD-10-CM

## 2023-03-21 DIAGNOSIS — Z7985 Long-term (current) use of injectable non-insulin antidiabetic drugs: Secondary | ICD-10-CM | POA: Diagnosis not present

## 2023-03-21 DIAGNOSIS — Z7984 Long term (current) use of oral hypoglycemic drugs: Secondary | ICD-10-CM

## 2023-03-21 LAB — POCT GLYCOSYLATED HEMOGLOBIN (HGB A1C): Hemoglobin A1C: 6.9 % — AB (ref 4.0–5.6)

## 2023-03-21 NOTE — Progress Notes (Signed)
Outpatient Endocrinology Note [place], [place]  03/21/23   [name] [birth year] [id number]  Referring Provider: Shelva Majestic, MD Primary Care Provider: Shelva Majestic, MD Reason for consultation: Subjective   Assessment & Plan  Diagnoses and all orders for this visit:  Uncontrolled type 2 diabetes mellitus with hyperglycemia, with long-term current use of insulin (HCC) -     POCT glycosylated hemoglobin (Hb A1C)  Long term (current) use of oral hypoglycemic drugs  Long-term (current) use of injectable non-insulin antidiabetic drugs  Mixed hypercholesterolemia and hypertriglyceridemia    Diabetes complicated by nephropathy Hba1c goal less than 7.0, current Hba1c is 6.9. Will recommend the following: Metformin 2000 mg a day, Trulicity 4.5 mg weekly Lantus 18 units at am, Premeal NovoLog 05-20-14 fifteen min before meals  No known contraindications to any of above medications  Hyperlipidemia -Last LDL at goal: 56, Tg 154 -on atorvastatin 40 mg QD and fenofibrate 160 mg qd -Follow low fat diet and exercise    -Blood pressure goal <140/90 - Microalbumin/creatinine at goal < 30 - on ACE/ARB Telmisartan 40 mg qd -diet changes including salt restriction -limit eating outside -counseled BP targets per standards of diabetes care -Uncontrolled blood pressure can lead to retinopathy, nephropathy and cardiovascular and atherosclerotic heart disease  Reviewed and counseled on: -A1C target -Blood sugar targets -Complications of uncontrolled diabetes  -Checking blood sugar before meals and bedtime and bring log next visit -All medications with mechanism of action and side effects -Hypoglycemia management: rule of 15's, Glucagon Emergency Kit and medical alert ID -low-carb low-fat plate-method diet -At least 20 minutes of physical activity per day -Annual dilated retinal eye exam and foot exam -compliance and follow up needs -follow up as scheduled  or earlier if problem gets worse  Call if blood sugar is less than 70 or consistently above 250    Take a 15 gm snack of carbohydrate at bedtime before you go to sleep if your blood sugar is less than 100.    If you are going to fast after midnight for a test or procedure, ask your physician for instructions on how to reduce/decrease your insulin dose.    Call if blood sugar is less than 70 or consistently above 250  -Treating a low sugar by rule of 15  (15 gms of sugar every 15 min until sugar is more than 70) If you feel your sugar is low, test your sugar to be sure If your sugar is low (less than 70), then take 15 grams of a fast acting Carbohydrate (3-4 glucose tablets or glucose gel or 4 ounces of juice or regular soda) Recheck your sugar 15 min after treating low to make sure it is more than 70 If sugar is still less than 70, treat again with 15 grams of carbohydrate          Don't drive the hour of hypoglycemia  If unconscious/unable to eat or drink by mouth, use glucagon injection or nasal spray baqsimi and call 911. Can repeat again in 15 min if still unconscious.  Return in about 4 months (around 07/21/2023).   I have reviewed current medications, nurse's notes, allergies, vital signs, past medical and surgical history, family medical history, and social history for this encounter. Counseled patient on symptoms, examination findings, lab findings, imaging results, treatment decisions and monitoring and prognosis. The patient understood the recommendations and agrees with the treatment plan. All questions regarding treatment plan were fully answered.  Jerricka Carvey,  MD  03/21/23    History of Present Illness [name] is a 62 y.o. year old female who presents for evaluation of Type 2 diabetes mellitus.  [name] was first diagnosed in 2007.    Home diabetes regimen: Metformin 2000 mg a day, Trulicity 4.5 mg weekly Lantus 16 units at am, Premeal NovoLog  05-20-14   Previous history:  Non-insulin hypoglycemic drugs previously used: Ozempic, metformin Insulin was started a few years after diagnosis Side effects from medications: Ozempic: nausea  COMPLICATIONS -  MI/Stroke -  retinopathy +  neuropathy-sees podiatrist annually Dr Logan Bores  +  nephropathy  BLOOD SUGAR DATA  CGM interpretation: At today's visit, we reviewed her CGM downloads. The full report is scanned in the media. Reviewing the CGM trends, Bg show uptrend at break fast time at 10 am followed by downward trend at 3pm  Physical Exam  BP (!) 144/96   Pulse 87   Ht 5\' 6"  (1.676 m)   Wt 214 lb 12.8 oz (97.4 kg)   SpO2 98%   BMI 34.67 kg/m    Constitutional: well developed, well nourished Head: normocephalic, atraumatic Eyes: sclera anicteric, no redness Neck: supple Lungs: normal respiratory effort Neurology: alert and oriented Skin: dry, no appreciable rashes Musculoskeletal: no appreciable defects Psychiatric: normal mood and affect   Current Medications Patient's Medications  New Prescriptions   No medications on file  Previous Medications   ALBUTEROL (PROVENTIL) (2.5 MG/3ML) 0.083% NEBULIZER SOLUTION    Take 2.5 mg by nebulization every 6 (six) hours as needed for wheezing or shortness of breath.   ALLOPURINOL (ZYLOPRIM) 300 MG TABLET    Take 1 tablet (300 mg total) by mouth daily.   ASPIRIN EC 81 MG TABLET    Take 1 tablet (81 mg total) by mouth daily.   ATORVASTATIN (LIPITOR) 40 MG TABLET    TAKE 1 TABLET BY MOUTH EVERY DAY   BENZONATATE (TESSALON PERLES) 100 MG CAPSULE    Take 1-2 capsules (100-200 mg total) by mouth 3 (three) times daily as needed for cough.   BREO ELLIPTA 200-25 MCG/INH AEPB    TAKE 1 PUFF BY MOUTH EVERY DAY   BUPROPION (WELLBUTRIN XL) 300 MG 24 HR TABLET    TAKE 1 TABLET BY MOUTH EVERY DAY   CHLORPHENIRAMINE-HYDROCODONE (TUSSIONEX) 10-8 MG/5ML    Take 5 mLs by mouth at bedtime as needed for cough (do not drive for 8 hours after taking).    CONTINUOUS GLUCOSE SENSOR (DEXCOM G7 SENSOR) MISC    1 DEVICE BY DOES NOT APPLY ROUTE AS DIRECTED. CHANGE SENSOR EVERY 10 DAYS   CYCLOBENZAPRINE (FLEXERIL) 5 MG TABLET    Take 1 tablet (5 mg total) by mouth 3 (three) times daily as needed for muscle spasms (or Headache).   DILTIAZEM (CARDIZEM CD) 120 MG 24 HR CAPSULE    Take 1 capsule (120 mg total) by mouth daily.   DULAGLUTIDE (TRULICITY) 4.5 MG/0.5ML SOPN    Inject 4.5 mg as directed once a week.   ESTRADIOL (ESTRACE) 0.1 MG/GM VAGINAL CREAM    Place 0.5 Applicatorfuls vaginally 2 (two) times a week.   FENOFIBRATE 160 MG TABLET    TAKE 1 TABLET BY MOUTH EVERY DAY   FLUTICASONE (FLONASE) 50 MCG/ACT NASAL SPRAY    Place 2 sprays into both nostrils daily as needed for allergies.   FUROSEMIDE (LASIX) 40 MG TABLET    Take 1 tablet (40 mg total) by mouth daily.   GABAPENTIN (NEURONTIN) 300 MG CAPSULE  Take at night in addition to the 100mg  gabapentin to get 400mg  at night and 100mg  in the morning and at mid day.   GLUCOSE BLOOD (CONTOUR NEXT TEST) TEST STRIP    1 each by Other route 4 (four) times daily. And lancets 4/day   IBUPROFEN (ADVIL) 800 MG TABLET    Take 1 tablet (800 mg total) by mouth 3 (three) times daily.   INSULIN ASPART (NOVOLOG FLEXPEN) 100 UNIT/ML FLEXPEN    Inject 15 Units into the skin daily with supper.   INSULIN GLARGINE (LANTUS SOLOSTAR) 100 UNIT/ML SOLOSTAR PEN    Inject 16 Units into the skin every morning.   INSULIN PEN NEEDLE (PEN NEEDLES) 32G X 4 MM MISC    1 each by Does not apply route 4 (four) times daily. E11.9   IPRATROPIUM (ATROVENT) 0.06 % NASAL SPRAY    PLACE 2 SPRAYS INTO BOTH NOSTRILS 4 TIMES DAILY.   KLOR-CON M20 20 MEQ TABLET    TAKE 1 & 1/2 TABLETS BY MOUTH TWICE A DAY   LANCET DEVICES (EASY MINI EJECT LANCING DEVICE) MISC    Use as directed 4 times daily to test blood sugar   LINZESS 290 MCG CAPS CAPSULE    TAKE 1 CAPSULE BY MOUTH DAILY BEFORE BREAKFAST.   METFORMIN (GLUCOPHAGE-XR) 500 MG 24 HR TABLET     Take 4 tablets (2,000 mg total) by mouth daily with breakfast.   MICROLET LANCETS MISC    1 each by Does not apply route 2 (two) times daily. E11.9   NYSTATIN (MYCOSTATIN) 100000 UNIT/ML SUSPENSION    Take 5 mLs (500,000 Units total) by mouth 4 (four) times daily.   ONDANSETRON (ZOFRAN-ODT) 4 MG DISINTEGRATING TABLET    Take 1 tablet (4 mg total) by mouth every 8 (eight) hours as needed for nausea or vomiting.   OXYCODONE-ACETAMINOPHEN (PERCOCET) 5-325 MG TABLET    Take 1 tablet by mouth every 4 (four) hours as needed for severe pain.   PANTOPRAZOLE (PROTONIX) 40 MG TABLET    Take 1 tablet (40 mg total) by mouth 2 (two) times daily.   TELMISARTAN (MICARDIS) 40 MG TABLET    Take 1 tablet (40 mg total) by mouth daily.   VENLAFAXINE XR (EFFEXOR-XR) 75 MG 24 HR CAPSULE    TAKE 1 CAPSULE BY MOUTH TWICE A DAY  Modified Medications   No medications on file  Discontinued Medications   No medications on file    Allergies Allergies  Allergen Reactions   Firvanq [Vancomycin] Other (See Comments)    Dose related nephrotoxicity    Trexall [Methotrexate] Other (See Comments)    Peri-oral and buccal lesions   Zestril [Lisinopril] Cough   Chlorhexidine Itching   Cleocin [Clindamycin] Nausea And Vomiting and Rash   Lincocin [Lincomycin Hcl] Nausea And Vomiting and Rash   Teflaro [Ceftaroline] Rash and Other (See Comments)    Tolerates ceftriaxone     Past Medical History Past Medical History:  Diagnosis Date   Abnormal SPEP 04/17/2014   Acute left ankle pain 01/26/2017   ANEMIA-UNSPECIFIED 09/18/2009   Anxiety    Arthritis    Blood transfusion without reported diagnosis    Cataract    CHF (congestive heart failure) (HCC)    Chronic diastolic heart failure, NYHA class 2 (HCC)    Normal LVEDP by May 2018   COPD (chronic obstructive pulmonary disease) (HCC)    Depression    DIABETES MELLITUS, TYPE II 08/21/2006   Diabetic osteomyelitis (HCC) 05/29/2015   Fatty  liver    Fracture of 5th  metatarsal    non union   GERD 08/21/2006   Had fundoplication   GOUT 08/20/2010   Hammer toe of right foot    2-5th toes   Hx of umbilical hernia repair    HYPERLIPIDEMIA 08/21/2006   HYPERTENSION 08/21/2006   Infection of wound due to methicillin resistant Staphylococcus aureus (MRSA)    Internal hemorrhoids    Kidney problem    Multiple allergies 10/14/2016   OBESITY 06/04/2009   Onychomycosis 10/27/2015   Osteomyelitis of left foot (HCC) 05/29/2015   Osteoporosis    Pulmonary sarcoidosis (HCC)    Followed locally by pulmonology, but also by Dr. Sandy Sal[facility]am at [facility]   Right knee pain 01/26/2017   Vocal cord dysfunction    Wears partial dentures     Past Surgical History Past Surgical History:  Procedure Laterality Date   ABDOMINAL HYSTERECTOMY     APPENDECTOMY     BLADDER SUSPENSION  11/11/2011   Procedure: TRANSVAGINAL TAPE (TVT) PROCEDURE;  Surgeon: Levi Aland, MD;  Location: [facility];  Service: Gynecology;  Laterality: N/A;   CAROTIDS  02/18/2011   CAROTID DUPLEX; VERTEBRALS ARE PATENT WITH ANTEGRADE FLOW. ICA/CCA RATIO 1.61 ON RIGHT AND 0.75 ON LEFT   CATARACT EXTRACTION, BILATERAL     CHOLECYSTECTOMY  1984   COLONOSCOPY  04/29/2010   Marina Goodell   CYSTOSCOPY  11/11/2011   Procedure: CYSTOSCOPY;  Surgeon: Levi Aland, MD;  Location: [facility];  Service: Gynecology;  Laterality: N/A;   EXTUBATION (ENDOTRACHEAL) IN OR N/A 09/23/2017   Procedure: EXTUBATION (ENDOTRACHEAL) IN OR;  Surgeon: Graylin Shiver, MD;  Location: [facility];  Service: ENT;  Laterality: N/A;   FIBEROPTIC LARYNGOSCOPY AND TRACHEOSCOPY N/A 09/23/2017   Procedure: FLEXIBLE FIBEROPTIC LARYNGOSCOPY;  Surgeon: Graylin Shiver, MD;  Location: [facility];  Service: ENT;  Laterality: N/A;   FRACTURE SURGERY     foot   HALLUX FUSION Left 10/02/2018   Procedure: HALLUX ARTHRODESIS;  Surgeon: Felecia Shelling, DPM;  Location: [facility];  Service: Podiatry;  Laterality: Left;   HAMMER TOE SURGERY Left  10/02/2018   Procedure: HAMMER TOE CORRECTION 2ND 3RD 4RD FIFTH TOE;  Surgeon: Felecia Shelling, DPM;  Location: [facility];  Service: Podiatry;  Laterality: Left;   HAMMER TOE SURGERY Right 04/12/2019   Procedure: HAMMER TOE CORRECTION, 2ND, 3RD, 4TH AND 5TH TOES OF RIGHT FOOT;  Surgeon: Felecia Shelling, DPM;  Location[facility];  Service: Podiatry;  Laterality: Right;   HAMMER TOE SURGERY Left 10/25/2019   Procedure: HAMMER TOE REPAIR SECOND, THIRD, FOUTH;  Surgeon: Felecia Shelling, DPM;  Location: [facility];  Service: Podiatry;  Laterality: Left;   HERNIA REPAIR     I & D EXTREMITY Left 06/27/2015   Procedure: Partial Excision Left Calcaneus, Place Antibiotic Beads, and Wound VAC;  Surgeon: Nadara Mustard, MD;  Location: [facility];  Service: Orthopedics;  Laterality: Left;   JOINT REPLACEMENT     KNEE ARTHROSCOPY     right   LEFT AND RIGHT HEART CATHETERIZATION WITH CORONARY ANGIOGRAM N/A 04/23/2013   Procedure: LEFT AND RIGHT HEART CATHETERIZATION WITH CORONARY ANGIOGRAM;  Surgeon: Marykay Lex, MD;  Location: [facility] CATH LAB;  Service: Cardiovascular;  Laterality: N/A;   LEFT HEART CATH AND CORONARY ANGIOGRAPHY N/A 03/11/2017   Procedure: Left Heart Cath and Coronary Angiography;  Surgeon: Tonny Bollman, MD;  Location: [facility] INVASIVE CV LAB; angiographically minimal CAD in the LAD otherwise  normal.;  Normal LVEDP.  FALSE POSITIVE MYOVIEW   LEFT HEART CATH AND CORONARY ANGIOGRAPHY  07/20/2010   LVEF 50-55% WITH VERY MILD GLOBAL HYPOKINESIA; ESSENTIALLY NORMAL CORONARY ARTERIES; NORMAL LV FUNCTION   METATARSAL OSTEOTOMY WITH OPEN REDUCTION INTERNAL FIXATION (ORIF) METATARSAL WITH FUSION Left 04/09/2014   Procedure: LEFT FOOT FRACTURE OPEN TREATMENT METATARSAL INCLUDES INTERNAL FIXATION EACH;  Surgeon: Nilda Simmer, MD;  Location: [facility];  Service: Orthopedics;  Laterality: Left;   NISSEN FUNDOPLICATION  2004   NM MYOVIEW LTD --> FALSE POSITIVE  03/10/2017   : Moderate size "stress-induced  "perfusion defect at the apex as well as "ill-defined stress-induced perfusion defect in the lateral wall.  EF 55%.  INTERMEDIATE risk. -->  FALSE POSITIVE   ORIF DISTAL FEMUR FRACTURE  08/08/2020   [facility]: ORIF of left extra-articular periprosthetic distal femur fracture with synthesis of the femur plate with cortical nonlocking screws.  (Thought to be pathologic fracture due to osteoporosis) -> after a fall   ORIF FEMUR W/ PERI-IMPLANT  08/29/2020   [facility]) revision of left femur fracture ORIF plate fixation screws; culture positive for MSSA   Right and left CARDIAC CATHETERIZATION  04/23/2013   Angiographic normal coronaries; LVEDP 20 mmHg, PCWP 12-14 mmHg, RAP 12 mmHg.; Fick CO/CI 4.9/2.2   ROTATOR CUFF REPAIR Right 12/09/2021   SHOULDER ARTHROSCOPY Left 03/14/2019   Procedure: LEFT SHOULDER ARTHROSCOPY, DEBRIDEMENT, AND DECOMPRESSION;  Surgeon: Nadara Mustard, MD;  [facility];  Service: Orthopedics;  Laterality: Left;   TOTAL KNEE ARTHROPLASTY Right 06/29/2017   Procedure: RIGHT TOTAL KNEE ARTHROPLASTY;  Surgeon: Nadara Mustard, MD;  Location[facility] OR;  Service: Orthopedics;  Laterality: Right;   TOTAL KNEE ARTHROPLASTY Left 12/07/2017   TOTAL KNEE ARTHROPLASTY Left 12/07/2017   Procedure: LEFT TOTAL KNEE ARTHROPLASTY;  Surgeon: Nadara Mustard, MD;  Location: [facility] OR;  Service: Orthopedics;  Laterality: Left;   TRACHEOSTOMY TUBE PLACEMENT N/A 09/20/2017   Procedure: AWAKE INTUBATION WITH ANESTHESIA WITH VIDEO ASSISTANCE;  Surgeon: Graylin Shiver, MD;  Location[facility];  Service: ENT;  Laterality: N/A;   TRANSTHORACIC ECHOCARDIOGRAM  08/2014   Normal LV size and function.  Mild LVH.  EF 55-60%.  Normal regional wall motion.  GR 1 DD.  Normal RV size and function .   TRANSTHORACIC ECHOCARDIOGRAM  08/12/2020    [facility]): Normal LV size and function.  EF 55 to 60%.  No RWM A.  Normal filling pattern.  Normal RV.  No significant valvular  disease-stenosis or regurgitation.  No vegetation.   TUBAL LIGATION     with reversal in 1994   UPPER GASTROINTESTINAL ENDOSCOPY     VENTRAL HERNIA REPAIR      Family History family history includes Asthma in her mother; Breast cancer in her mother; COPD in her mother; Coronary artery disease in her father; Diabetes in her brother, father, and mother; Emphysema in her mother; Heart attack in her maternal grandfather; Heart attack (age of onset: 56) in her father; Heart disease in her father; Heart failure in her father and mother; Hypertension in her father; Kidney disease in her mother; Lung cancer in her brother; Obesity in her father; Sarcoidosis in her maternal uncle; Sleep apnea in her father; Throat cancer in her father; Thyroid disease in her mother.  Social History Social History   Socioeconomic History   Marital status: Married    Spouse name: EESHA SCHMALTZ   Number of children:  2   Years of education: 73   Highest education level: Not on file  Occupational History   Occupation: DISABLED  Tobacco Use   Smoking status: Never   Smokeless tobacco: Never  Vaping Use   Vaping Use: Never used  Substance and Sexual Activity   Alcohol use: No    Alcohol/week: 0.0 standard drinks of alcohol   Drug use: No   Sexual activity: Yes    Birth control/protection: Surgical  Other Topics Concern   Not on file  Social History Narrative   Married 1994. 2 sons who both live close and 1 grandson.       Disability due to sarcoidosis. 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 [employer]   Social Determinants of Health   Financial Resource Strain: Low Risk  (03/14/2023)   Overall Financial Resource Strain (CARDIA)    Difficulty of Paying Living Expenses: Not hard at all  Food Insecurity: No Food Insecurity (03/14/2023)   Hunger Vital Sign    Worried About  Running Out of Food in the Last Year: Never true    Ran Out of Food in the Last Year: Never true  Transportation Needs: No Transportation Needs (03/14/2023)   PRAPARE - Administrator, Civil Service (Medical): No    Lack of Transportation (Non-Medical): No  Physical Activity: Insufficiently Active (03/14/2023)   Exercise Vital Sign    Days of Exercise per Week: 3 days    Minutes of Exercise per Session: 30 min  Stress: No Stress Concern Present (03/14/2023)   [employer] of Occupational Health - Occupational Stress Questionnaire    Feeling of Stress : Not at all  Social Connections: Socially Integrated (03/14/2023)   Social Connection and Isolation Panel [NHANES]    Frequency of Communication with Friends and Family: More than three times a week    Frequency of Social Gatherings with Friends and Family: More than three times a week    Attends Religious Services: More than 4 times per year    Active Member of Clubs or Organizations: Yes    Attends Banker Meetings: 1 to 4 times per year    Marital Status: Married  Catering manager Violence: Not At Risk (03/15/2023)   Humiliation, Afraid, Rape, and Kick questionnaire    Fear of Current or Ex-Partner: No    Emotionally Abused: No    Physically Abused: No    Sexually Abused: No    Lab Results  Component Value Date   HGBA1C 6.9 (A) 03/21/2023   HGBA1C 8.2 (H) 12/13/2022   HGBA1C 8.1 (H) 10/12/2022   Lab Results  Component Value Date   CHOL 137 10/12/2022   Lab Results  Component Value Date   HDL 50.60 10/12/2022   Lab Results  Component Value Date   LDLCALC 56 10/12/2022   Lab Results  Component Value Date   TRIG 154.0 (H) 10/12/2022   Lab Results  Component Value Date   CHOLHDL 3 10/12/2022   Lab Results  Component Value Date   CREATININE 1.15 02/10/2023   Lab Results  Component Value Date   GFR 50.69 (L) 02/10/2023   Lab Results  Component Value Date   MICROALBUR 2.2 (H) 02/25/2023       Component Value Date/Time   NA 139 02/10/2023 1141   NA 139 10/20/2020 0000   NA  135 (L) 04/17/2014 1039   K 4.9 02/10/2023 1141   K 4.5 04/17/2014 1039   CL 99 02/10/2023 1141   CO2 30 02/10/2023 1141   CO2 26 04/17/2014 1039   GLUCOSE 178 (H) 02/10/2023 1141   GLUCOSE 300 (H) 04/17/2014 1039   GLUCOSE 150 (H) 09/14/2006 1033   BUN 16 02/10/2023 1141   BUN 10 10/20/2020 0000   BUN 13.9 04/17/2014 1039   CREATININE 1.15 02/10/2023 1141   CREATININE 0.88 08/21/2020 1404   CREATININE 0.8 04/17/2014 1039   CALCIUM 9.5 02/10/2023 1141   CALCIUM 10.0 04/17/2014 1039   PROT 6.7 02/10/2023 1141   PROT 6.7 03/31/2020 1316   PROT 6.8 04/17/2014 1039   ALBUMIN 4.4 02/10/2023 1141   ALBUMIN 4.6 03/31/2020 1316   ALBUMIN 3.7 04/17/2014 1039   AST 18 02/10/2023 1141   AST 8 04/17/2014 1039   ALT 16 02/10/2023 1141   ALT 20 04/17/2014 1039   ALKPHOS 74 02/10/2023 1141   ALKPHOS 82 04/17/2014 1039   BILITOT 0.6 02/10/2023 1141   BILITOT 0.3 03/31/2020 1316   BILITOT 0.45 04/17/2014 1039   GFRNONAA 56 (L) 11/11/2022 0144   GFRNONAA 71 08/21/2020 1404   GFRAA 96 10/20/2020 0000   GFRAA 82 08/21/2020 1404      Latest Ref Rng & Units 02/10/2023   11:41 AM 12/13/2022   11:05 AM 11/11/2022    1:44 AM  BMP  Glucose 70 - 99 mg/dL 604  540  981   BUN 6 - 23 mg/dL 16  19  25    Creatinine 0.40 - 1.20 mg/dL 1.91  4.78  2.95   Sodium 135 - 145 mEq/L 139  139  134   Potassium 3.5 - 5.1 mEq/L 4.9  4.5  4.5   Chloride 96 - 112 mEq/L 99  100  99   CO2 19 - 32 mEq/L 30  30  22    Calcium 8.4 - 10.5 mg/dL 9.5  9.6  9.1        Component Value Date/Time   WBC 11.1 (H) 02/25/2023 1014   RBC 3.82 (L) 02/25/2023 1014   HGB 11.5 (L) 02/25/2023 1014   HGB 12.1 04/17/2014 1039   HCT 34.3 (L) 02/25/2023 1014   HCT 36.9 04/17/2014 1039   PLT 389.0 02/25/2023 1014   PLT 436 (H) 04/17/2014 1039   MCV 89.7 02/25/2023 1014   MCV 87.9 04/17/2014 1039   MCH 29.7 11/10/2022 0409   MCHC 33.5  02/25/2023 1014   RDW 12.9 02/25/2023 1014   RDW 14.5 04/17/2014 1039   LYMPHSABS 2.7 02/25/2023 1014   LYMPHSABS 2.2 04/17/2014 1039   MONOABS 0.6 02/25/2023 1014   MONOABS 0.8 04/17/2014 1039   EOSABS 0.4 02/25/2023 1014   EOSABS 0.1 04/17/2014 1039   BASOSABS 0.1 02/25/2023 1014   BASOSABS 0.0 04/17/2014 1039     Parts of this note may have been dictated using voice recognition software. There may be variances in spelling and vocabulary which are unintentional. Not all errors are proofread. Please notify the Thereasa Parkin if any discrepancies are noted or if the meaning of any statement is not clear.

## 2023-03-22 ENCOUNTER — Ambulatory Visit: Payer: Medicare Other

## 2023-03-22 ENCOUNTER — Ambulatory Visit (INDEPENDENT_AMBULATORY_CARE_PROVIDER_SITE_OTHER): Payer: Medicare Other

## 2023-03-22 DIAGNOSIS — E538 Deficiency of other specified B group vitamins: Secondary | ICD-10-CM

## 2023-03-22 MED ORDER — CYANOCOBALAMIN 1000 MCG/ML IJ SOLN
1000.0000 ug | Freq: Once | INTRAMUSCULAR | Status: AC
Start: 2023-03-22 — End: 2023-03-22
  Administered 2023-03-22: 1000 ug via INTRAMUSCULAR

## 2023-03-22 NOTE — Progress Notes (Signed)
Pt tolerated b12 injection well. 

## 2023-03-24 ENCOUNTER — Ambulatory Visit: Payer: Medicare Other

## 2023-03-24 DIAGNOSIS — M1812 Unilateral primary osteoarthritis of first carpometacarpal joint, left hand: Secondary | ICD-10-CM | POA: Diagnosis not present

## 2023-03-24 DIAGNOSIS — M65312 Trigger thumb, left thumb: Secondary | ICD-10-CM | POA: Diagnosis not present

## 2023-03-24 DIAGNOSIS — G5602 Carpal tunnel syndrome, left upper limb: Secondary | ICD-10-CM | POA: Diagnosis not present

## 2023-03-24 DIAGNOSIS — M654 Radial styloid tenosynovitis [de Quervain]: Secondary | ICD-10-CM | POA: Diagnosis not present

## 2023-03-30 ENCOUNTER — Telehealth: Payer: Self-pay | Admitting: *Deleted

## 2023-03-30 NOTE — Telephone Encounter (Signed)
   Pre-operative Risk Assessment    Patient Name: [name]  DOB: March 28, 1961 MRN: [id number]      Request for Surgical Clearance    Procedure:   LEFT CMC ARTHROPLASTY WITH ALLODERM, THUMB A1 PULLEY RELEASE, FIRST DORSAL COMPETENT RELEASE, CARPAL TUNNEL RELEASE  Date of Surgery:  Clearance TBD                                 Surgeon:  Kelli Hope, MD Surgeon's Group or Practice Name:  [facility] Phone number:  [phone] Fax number:  [phone]   Type of Clearance Requested:   - Medical  - Pharmacy:  Hold Aspirin NOT INDICATED HOW LONG   Type of Anesthesia:  Not Indicated   Additional requests/questions:    [name]   03/30/2023, 11:35 AM

## 2023-03-30 NOTE — Telephone Encounter (Signed)
   Name: ABIA HOCKENBURY  DOB: 1960-11-25  MRN: [id number]   Primary Cardiologist: Bryan Lemma, MD  Chart reviewed as part of pre-operative protocol coverage. Patient was contacted 03/30/2023 in reference to pre-operative risk assessment for pending surgery as outlined below.  KENDALYNN KANAAN was last seen on 02/17/2023 by Marjie Skiff, PA.  Since that day, ANILU COUNCILMAN has done well from a cardiac standpoint.  Denies any new symptoms or concerns.  She is able to complete greater than 4 METS without difficulty.  Therefore, based on ACC/AHA guidelines, the patient would be at acceptable risk for the planned procedure without further cardiovascular testing.   Per office protocol, she may hold Aspirin for 5-7 days prior to procedure. Please resume Aspirin as soon as possible postprocedure, at the discretion of the surgeon.   The patient was advised that if she develops new symptoms prior to surgery to contact our office to arrange for a follow-up visit, and she verbalized understanding.  I will route this recommendation to the requesting party via Epic fax function and remove from pre-op pool. Please call with questions.  Joylene Grapes, NP 03/30/2023, 12:11 PM

## 2023-03-31 ENCOUNTER — Encounter: Payer: Medicare Other | Admitting: Family Medicine

## 2023-04-04 ENCOUNTER — Ambulatory Visit: Payer: Medicare Other | Admitting: Nurse Practitioner

## 2023-04-12 DIAGNOSIS — Z79899 Other long term (current) drug therapy: Secondary | ICD-10-CM | POA: Diagnosis not present

## 2023-04-12 DIAGNOSIS — M13842 Other specified arthritis, left hand: Secondary | ICD-10-CM | POA: Diagnosis not present

## 2023-04-12 DIAGNOSIS — G5603 Carpal tunnel syndrome, bilateral upper limbs: Secondary | ICD-10-CM | POA: Diagnosis not present

## 2023-04-12 DIAGNOSIS — G5602 Carpal tunnel syndrome, left upper limb: Secondary | ICD-10-CM | POA: Diagnosis not present

## 2023-04-12 DIAGNOSIS — I110 Hypertensive heart disease with heart failure: Secondary | ICD-10-CM | POA: Diagnosis not present

## 2023-04-12 DIAGNOSIS — I509 Heart failure, unspecified: Secondary | ICD-10-CM | POA: Diagnosis not present

## 2023-04-12 DIAGNOSIS — E119 Type 2 diabetes mellitus without complications: Secondary | ICD-10-CM | POA: Diagnosis not present

## 2023-04-12 DIAGNOSIS — J449 Chronic obstructive pulmonary disease, unspecified: Secondary | ICD-10-CM | POA: Diagnosis not present

## 2023-04-12 DIAGNOSIS — Z794 Long term (current) use of insulin: Secondary | ICD-10-CM | POA: Diagnosis not present

## 2023-04-12 DIAGNOSIS — M65312 Trigger thumb, left thumb: Secondary | ICD-10-CM | POA: Diagnosis not present

## 2023-04-12 DIAGNOSIS — M654 Radial styloid tenosynovitis [de Quervain]: Secondary | ICD-10-CM | POA: Diagnosis not present

## 2023-04-21 DIAGNOSIS — G5602 Carpal tunnel syndrome, left upper limb: Secondary | ICD-10-CM | POA: Diagnosis not present

## 2023-04-21 DIAGNOSIS — M65312 Trigger thumb, left thumb: Secondary | ICD-10-CM | POA: Diagnosis not present

## 2023-04-21 DIAGNOSIS — M1812 Unilateral primary osteoarthritis of first carpometacarpal joint, left hand: Secondary | ICD-10-CM | POA: Diagnosis not present

## 2023-04-21 DIAGNOSIS — M654 Radial styloid tenosynovitis [de Quervain]: Secondary | ICD-10-CM | POA: Diagnosis not present

## 2023-04-26 DIAGNOSIS — M1812 Unilateral primary osteoarthritis of first carpometacarpal joint, left hand: Secondary | ICD-10-CM | POA: Diagnosis not present

## 2023-04-26 DIAGNOSIS — M25542 Pain in joints of left hand: Secondary | ICD-10-CM | POA: Diagnosis not present

## 2023-04-26 DIAGNOSIS — M25642 Stiffness of left hand, not elsewhere classified: Secondary | ICD-10-CM | POA: Diagnosis not present

## 2023-05-03 DIAGNOSIS — M1812 Unilateral primary osteoarthritis of first carpometacarpal joint, left hand: Secondary | ICD-10-CM | POA: Diagnosis not present

## 2023-05-05 ENCOUNTER — Telehealth: Payer: Self-pay | Admitting: Pharmacy Technician

## 2023-05-05 NOTE — Telephone Encounter (Signed)
We received a fax form from a Diabetic supply company. Looks like it was last used when the pt saw Dr. Everardo All. Saved to the Media tab, if you need to use it for a new order.

## 2023-05-06 NOTE — Telephone Encounter (Signed)
Noted thank you

## 2023-05-09 DIAGNOSIS — Z961 Presence of intraocular lens: Secondary | ICD-10-CM | POA: Diagnosis not present

## 2023-05-09 DIAGNOSIS — E119 Type 2 diabetes mellitus without complications: Secondary | ICD-10-CM | POA: Diagnosis not present

## 2023-05-09 DIAGNOSIS — H52203 Unspecified astigmatism, bilateral: Secondary | ICD-10-CM | POA: Diagnosis not present

## 2023-05-11 ENCOUNTER — Other Ambulatory Visit: Payer: Self-pay | Admitting: Family Medicine

## 2023-05-11 ENCOUNTER — Other Ambulatory Visit: Payer: Self-pay | Admitting: Endocrinology

## 2023-05-11 ENCOUNTER — Other Ambulatory Visit: Payer: Self-pay

## 2023-05-11 DIAGNOSIS — M25542 Pain in joints of left hand: Secondary | ICD-10-CM | POA: Diagnosis not present

## 2023-05-11 DIAGNOSIS — M1812 Unilateral primary osteoarthritis of first carpometacarpal joint, left hand: Secondary | ICD-10-CM | POA: Diagnosis not present

## 2023-05-11 DIAGNOSIS — E1165 Type 2 diabetes mellitus with hyperglycemia: Secondary | ICD-10-CM

## 2023-05-11 DIAGNOSIS — M25642 Stiffness of left hand, not elsewhere classified: Secondary | ICD-10-CM | POA: Diagnosis not present

## 2023-05-11 MED ORDER — TRULICITY 4.5 MG/0.5ML ~~LOC~~ SOAJ: 4.5000 mg | SUBCUTANEOUS | 3 refills | Status: DC

## 2023-05-11 NOTE — Telephone Encounter (Signed)
Last OV 07/28/22 Next OV not scheduled  Last refill 03/29/22 Qty #90/3

## 2023-05-13 ENCOUNTER — Other Ambulatory Visit: Payer: Self-pay

## 2023-05-13 MED ORDER — NOVOLOG FLEXPEN 100 UNIT/ML ~~LOC~~ SOPN: 15.0000 [IU] | PEN_INJECTOR | Freq: Every day | SUBCUTANEOUS | 11 refills | Status: DC

## 2023-05-19 DIAGNOSIS — M1812 Unilateral primary osteoarthritis of first carpometacarpal joint, left hand: Secondary | ICD-10-CM | POA: Diagnosis not present

## 2023-05-26 ENCOUNTER — Encounter (INDEPENDENT_AMBULATORY_CARE_PROVIDER_SITE_OTHER): Payer: Self-pay

## 2023-05-31 DIAGNOSIS — M1812 Unilateral primary osteoarthritis of first carpometacarpal joint, left hand: Secondary | ICD-10-CM | POA: Diagnosis not present

## 2023-05-31 DIAGNOSIS — G5602 Carpal tunnel syndrome, left upper limb: Secondary | ICD-10-CM | POA: Diagnosis not present

## 2023-06-01 DIAGNOSIS — M25542 Pain in joints of left hand: Secondary | ICD-10-CM | POA: Diagnosis not present

## 2023-06-01 DIAGNOSIS — M25642 Stiffness of left hand, not elsewhere classified: Secondary | ICD-10-CM | POA: Diagnosis not present

## 2023-06-01 DIAGNOSIS — M1812 Unilateral primary osteoarthritis of first carpometacarpal joint, left hand: Secondary | ICD-10-CM | POA: Diagnosis not present

## 2023-06-15 ENCOUNTER — Encounter: Payer: Self-pay | Admitting: Physician Assistant

## 2023-06-15 ENCOUNTER — Ambulatory Visit (INDEPENDENT_AMBULATORY_CARE_PROVIDER_SITE_OTHER): Payer: Medicare Other | Admitting: Physician Assistant

## 2023-06-15 VITALS — BP 134/67 | HR 86 | Temp 97.8°F | Ht 66.0 in | Wt 219.8 lb

## 2023-06-15 DIAGNOSIS — M1812 Unilateral primary osteoarthritis of first carpometacarpal joint, left hand: Secondary | ICD-10-CM | POA: Diagnosis not present

## 2023-06-15 DIAGNOSIS — R300 Dysuria: Secondary | ICD-10-CM

## 2023-06-15 DIAGNOSIS — R1011 Right upper quadrant pain: Secondary | ICD-10-CM

## 2023-06-15 DIAGNOSIS — M25642 Stiffness of left hand, not elsewhere classified: Secondary | ICD-10-CM | POA: Diagnosis not present

## 2023-06-15 DIAGNOSIS — M25542 Pain in joints of left hand: Secondary | ICD-10-CM | POA: Diagnosis not present

## 2023-06-15 LAB — POCT URINALYSIS DIPSTICK
Bilirubin, UA: NEGATIVE
Blood, UA: NEGATIVE
Glucose, UA: NEGATIVE
Ketones, UA: 1
Nitrite, UA: NEGATIVE
Protein, UA: NEGATIVE
Spec Grav, UA: 1.015 (ref 1.010–1.025)
Urobilinogen, UA: 0.2 U/dL
pH, UA: 6 (ref 5.0–8.0)

## 2023-06-15 MED ORDER — CEPHALEXIN 500 MG PO CAPS
500.0000 mg | ORAL_CAPSULE | Freq: Two times a day (BID) | ORAL | 0 refills | Status: DC
Start: 2023-06-15 — End: 2023-06-17

## 2023-06-15 NOTE — Patient Instructions (Signed)
It was great to see you!  Start keflex antibiotic for your urinary tract infection (UTI) We will get blood work and imaging for your abdominal pain  If any worsening pain in the meantime, please go to the ER  Take care,  Jarold Motto PA-C

## 2023-06-15 NOTE — Progress Notes (Signed)
[name] is a 62 y.o. female here for a new problem.  History of Present Illness:   Chief Complaint  Patient presents with   Dysuria    X2weeks, R side Flank Pain, ,ABD/Pelvic Pain, No Hematuria, Freqent urination,no fever, Normal void      UTI She has been experiencing worsening dysuria since 06/01/23. She has not taken AZO or other OTC medications. Denies vaginal discharge, constipation, nausea and vomiting, fever, chills. No recent travel. Urinalysis today shows small leukocytes. She has history of urinary tract infection (UTI) symptom(s) - this is similar presentation.  RUQ Abdominal Pain She has had worsening RUQ abdominal pain for 4 weeks. Rates pain at 4/10 with changing intensity, oftentimes just cannot get comfortable when trying to sit down Nothing seems to improve or worsen pain. She has not started any new medications recently.  Past Medical History:  Diagnosis Date   Abnormal SPEP 04/17/2014   Acute left ankle pain 01/26/2017   ANEMIA-UNSPECIFIED 09/18/2009   Anxiety    Arthritis    Blood transfusion without reported diagnosis    Cataract    CHF (congestive heart failure) (HCC)    Chronic diastolic heart failure, NYHA class 2 (HCC)    Normal LVEDP by May 2018   COPD (chronic obstructive pulmonary disease) (HCC)    Depression    DIABETES MELLITUS, TYPE II 08/21/2006   Diabetic osteomyelitis (HCC) 05/29/2015   Fatty liver    Fracture of 5th metatarsal    non union   GERD 08/21/2006   Had fundoplication   GOUT 08/20/2010   Hammer toe of right foot    2-5th toes   Hx of umbilical hernia repair    HYPERLIPIDEMIA 08/21/2006   HYPERTENSION 08/21/2006   Infection of wound due to methicillin resistant Staphylococcus aureus (MRSA)    Internal hemorrhoids    Kidney problem    Multiple allergies 10/14/2016   OBESITY 06/04/2009   Onychomycosis 10/27/2015   Osteomyelitis of left foot (HCC) 05/29/2015   Osteoporosis    Pulmonary sarcoidosis (HCC)    Followed  locally by pulmonology, but also by Dr. Sandy Salaam at [facility]   Right knee pain 01/26/2017   Vocal cord dysfunction    Wears partial dentures      Social History   Tobacco Use   Smoking status: Never   Smokeless tobacco: Never  Vaping Use   Vaping status: Never Used  Substance Use Topics   Alcohol use: No    Alcohol/week: 0.0 standard drinks of alcohol   Drug use: No    Past Surgical History:  Procedure Laterality Date   ABDOMINAL HYSTERECTOMY     APPENDECTOMY     BLADDER SUSPENSION  11/11/2011   Procedure: TRANSVAGINAL TAPE (TVT) PROCEDURE;  Surgeon: Levi Aland, MD;  Location: [facility];  Service: Gynecology;  Laterality: N/A;   CAROTIDS  02/18/2011   CAROTID DUPLEX; VERTEBRALS ARE PATENT WITH ANTEGRADE FLOW. ICA/CCA RATIO 1.61 ON RIGHT AND 0.75 ON LEFT   CATARACT EXTRACTION, BILATERAL     CHOLECYSTECTOMY  1984   COLONOSCOPY  04/29/2010   Marina Goodell   CYSTOSCOPY  11/11/2011   Procedure: CYSTOSCOPY;  Surgeon: Levi Aland, MD;  Location: [facility];  Service: Gynecology;  Laterality: N/A;   EXTUBATION (ENDOTRACHEAL) IN OR N/A 09/23/2017   Procedure: EXTUBATION (ENDOTRACHEAL) IN OR;  Surgeon: Graylin Shiver, MD;  Location: [facility];  Service: ENT;  Laterality: N/A;   FIBEROPTIC LARYNGOSCOPY AND TRACHEOSCOPY N/A 09/23/2017   Procedure: FLEXIBLE FIBEROPTIC  LARYNGOSCOPY;  Surgeon: Graylin Shiver, MD;  Location: [facility] OR;  Service: ENT;  Laterality: N/A;   FRACTURE SURGERY     foot   HALLUX FUSION Left 10/02/2018   Procedure: HALLUX ARTHRODESIS;  Surgeon: Felecia Shelling, DPM;  Location: [facility];  Service: Podiatry;  Laterality: Left;   HAMMER TOE SURGERY Left 10/02/2018   Procedure: HAMMER TOE CORRECTION 2ND 3RD 4RD FIFTH TOE;  Surgeon: Felecia Shelling, DPM;  Location: [facility];  Service: Podiatry;  Laterality: Left;   HAMMER TOE SURGERY Right 04/12/2019   Procedure: HAMMER TOE CORRECTION, 2ND, 3RD, 4TH AND 5TH TOES OF RIGHT FOOT;  Surgeon: Felecia Shelling, DPM;   Location: [facility];  Service: Podiatry;  Laterality: Right;   HAMMER TOE SURGERY Left 10/25/2019   Procedure: HAMMER TOE REPAIR SECOND, THIRD, FOUTH;  Surgeon: Felecia Shelling, DPM;  Location: [facility];  Service: Podiatry;  Laterality: Left;   HERNIA REPAIR     I & D EXTREMITY Left 06/27/2015   Procedure: Partial Excision Left Calcaneus, Place Antibiotic Beads, and Wound VAC;  Surgeon: Nadara Mustard, MD;  Location: [facility];  Service: Orthopedics;  Laterality: Left;   JOINT REPLACEMENT     KNEE ARTHROSCOPY     right   LEFT AND RIGHT HEART CATHETERIZATION WITH CORONARY ANGIOGRAM N/A 04/23/2013   Procedure: LEFT AND RIGHT HEART CATHETERIZATION WITH CORONARY ANGIOGRAM;  Surgeon: Marykay Lex, MD;  Location: [facility] CATH LAB;  Service: Cardiovascular;  Laterality: N/A;   LEFT HEART CATH AND CORONARY ANGIOGRAPHY N/A 03/11/2017   Procedure: Left Heart Cath and Coronary Angiography;  Surgeon: Tonny Bollman, MD;  Location: [facility] INVASIVE CV LAB; angiographically minimal CAD in the LAD otherwise normal.;  Normal LVEDP.  FALSE POSITIVE MYOVIEW   LEFT HEART CATH AND CORONARY ANGIOGRAPHY  07/20/2010   LVEF 50-55% WITH VERY MILD GLOBAL HYPOKINESIA; ESSENTIALLY NORMAL CORONARY ARTERIES; NORMAL LV FUNCTION   METATARSAL OSTEOTOMY WITH OPEN REDUCTION INTERNAL FIXATION (ORIF) METATARSAL WITH FUSION Left 04/09/2014   Procedure: LEFT FOOT FRACTURE OPEN TREATMENT METATARSAL INCLUDES INTERNAL FIXATION EACH;  Surgeon: Nilda Simmer, MD;  Location: [facility];  Service: Orthopedics;  Laterality: Left;   NISSEN FUNDOPLICATION  2004   NM MYOVIEW LTD --> FALSE POSITIVE  03/10/2017   : Moderate size "stress-induced "perfusion defect at the apex as well as "ill-defined stress-induced perfusion defect in the lateral wall.  EF 55%.  INTERMEDIATE risk. -->  FALSE POSITIVE   ORIF DISTAL FEMUR FRACTURE  08/08/2020   [facility]: ORIF of left extra-articular periprosthetic distal femur fracture with synthesis of  the femur plate with cortical nonlocking screws.  (Thought to be pathologic fracture due to osteoporosis) -> after a fall   ORIF FEMUR W/ PERI-IMPLANT  08/29/2020   [facility]) revision of left femur fracture ORIF plate fixation screws; culture positive for MSSA   Right and left CARDIAC CATHETERIZATION  04/23/2013   Angiographic normal coronaries; LVEDP 20 mmHg, PCWP 12-14 mmHg, RAP 12 mmHg.; Fick CO/CI 4.9/2.2   ROTATOR CUFF REPAIR Right 12/09/2021   SHOULDER ARTHROSCOPY Left 03/14/2019   Procedure: LEFT SHOULDER ARTHROSCOPY, DEBRIDEMENT, AND DECOMPRESSION;  Surgeon: Nadara Mustard, MD;  Location: [facility];  Service: Orthopedics;  Laterality: Left;   TOTAL KNEE ARTHROPLASTY Right 06/29/2017   Procedure: RIGHT TOTAL KNEE ARTHROPLASTY;  Surgeon: Nadara Mustard, MD;  Location: [facility] OR;  Service: Orthopedics;  Laterality: Right;   TOTAL KNEE ARTHROPLASTY Left 12/07/2017   TOTAL KNEE ARTHROPLASTY  Left 12/07/2017   Procedure: LEFT TOTAL KNEE ARTHROPLASTY;  Surgeon: Nadara Mustard, MD;  Location: [facility] OR;  Service: Orthopedics;  Laterality: Left;   TRACHEOSTOMY TUBE PLACEMENT N/A 09/20/2017   Procedure: AWAKE INTUBATION WITH ANESTHESIA WITH VIDEO ASSISTANCE;  Surgeon: Graylin Shiver, MD;  Location: [facility];  Service: ENT;  Laterality: N/A;   TRANSTHORACIC ECHOCARDIOGRAM  08/2014   Normal LV size and function.  Mild LVH.  EF 55-60%.  Normal regional wall motion.  GR 1 DD.  Normal RV size and function .   TRANSTHORACIC ECHOCARDIOGRAM  08/12/2020    [facility]): Normal LV size and function.  EF 55 to 60%.  No RWM A.  Normal filling pattern.  Normal RV.  No significant valvular disease-stenosis or regurgitation.  No vegetation.   TUBAL LIGATION     with reversal in 1994   UPPER GASTROINTESTINAL ENDOSCOPY     VENTRAL HERNIA REPAIR      Family History  Problem Relation Age of Onset   Diabetes Father    Heart attack Father 44   Coronary artery disease Father    Heart failure  Father    Throat cancer Father    Hypertension Father    Heart disease Father    Sleep apnea Father    Obesity Father    COPD Mother    Emphysema Mother    Asthma Mother    Heart failure Mother    Breast cancer Mother    Diabetes Mother    Kidney disease Mother    Thyroid disease Mother    Heart attack Maternal Grandfather    Sarcoidosis Maternal Uncle    Lung cancer Brother    Diabetes Brother    Colon cancer Neg Hx    Colon polyps Neg Hx    Esophageal cancer Neg Hx    Rectal cancer Neg Hx    Stomach cancer Neg Hx     Allergies  Allergen Reactions   Firvanq [Vancomycin] Other (See Comments)    Dose related nephrotoxicity    Trexall [Methotrexate] Other (See Comments)    Peri-oral and buccal lesions   Zestril [Lisinopril] Cough   Chlorhexidine Itching   Cleocin [Clindamycin] Nausea And Vomiting and Rash   Lincocin [Lincomycin Hcl] Nausea And Vomiting and Rash   Teflaro [Ceftaroline] Rash and Other (See Comments)    Tolerates ceftriaxone     Current Medications:   Current Outpatient Medications:    albuterol (PROVENTIL) (2.5 MG/3ML) 0.083% nebulizer solution, Take 2.5 mg by nebulization every 6 (six) hours as needed for wheezing or shortness of breath., Disp: , Rfl:    allopurinol (ZYLOPRIM) 300 MG tablet, Take 1 tablet (300 mg total) by mouth daily., Disp: 90 tablet, Rfl: 0   aspirin EC 81 MG tablet, Take 1 tablet (81 mg total) by mouth daily., Disp: 30 tablet, Rfl: 11   atorvastatin (LIPITOR) 40 MG tablet, TAKE 1 TABLET BY MOUTH EVERY DAY (Patient taking differently: Take 40 mg by mouth daily.), Disp: 90 tablet, Rfl: 3   benzonatate (TESSALON PERLES) 100 MG capsule, Take 1-2 capsules (100-200 mg total) by mouth 3 (three) times daily as needed for cough., Disp: 30 capsule, Rfl: 0   BREO ELLIPTA 200-25 MCG/INH AEPB, TAKE 1 PUFF BY MOUTH EVERY DAY (Patient taking differently: Inhale 1 puff into the lungs daily.), Disp: 60 each, Rfl: 5   buPROPion (WELLBUTRIN XL) 300 MG  24 hr tablet, TAKE 1 TABLET BY MOUTH EVERY DAY, Disp: 90 tablet, Rfl: 3  chlorpheniramine-HYDROcodone (TUSSIONEX) 10-8 MG/5ML, Take 5 mLs by mouth at bedtime as needed for cough (do not drive for 8 hours after taking)., Disp: 115 mL, Rfl: 0   Continuous Glucose Sensor (DEXCOM G7 SENSOR) MISC, 1 DEVICE BY DOES NOT APPLY ROUTE AS DIRECTED. CHANGE SENSOR EVERY 10 DAYS, Disp: 3 each, Rfl: 3   cyclobenzaprine (FLEXERIL) 5 MG tablet, Take 1 tablet (5 mg total) by mouth 3 (three) times daily as needed for muscle spasms (or Headache)., Disp: 30 tablet, Rfl: 0   diltiazem (CARDIZEM CD) 120 MG 24 hr capsule, Take 1 capsule (120 mg total) by mouth daily., Disp: 90 capsule, Rfl: 3   Dulaglutide (TRULICITY) 4.5 MG/0.5ML SOPN, Inject 4.5 mg as directed once a week., Disp: 6 mL, Rfl: 3   estradiol (ESTRACE) 0.1 MG/GM vaginal cream, Place 0.5 Applicatorfuls vaginally 2 (two) times a week., Disp: , Rfl:    fenofibrate 160 MG tablet, TAKE 1 TABLET BY MOUTH EVERY DAY, Disp: 90 tablet, Rfl: 1   fluticasone (FLONASE) 50 MCG/ACT nasal spray, Place 2 sprays into both nostrils daily as needed for allergies. (Patient taking differently: Place 1 spray into both nostrils daily.), Disp: 48 mL, Rfl: 3   furosemide (LASIX) 40 MG tablet, Take 1 tablet (40 mg total) by mouth daily., Disp: , Rfl:    gabapentin (NEURONTIN) 300 MG capsule, TAKE AT NIGHT IN ADDITION TO THE 100MG  GABAPENTIN TO GET 400MG  AT NIGHT AND 100MG  IN THE MORNING AND AT MID DAY., Disp: 90 capsule, Rfl: 0   glucose blood (CONTOUR NEXT TEST) test strip, 1 each by Other route 4 (four) times daily. And lancets 4/day, Disp: 400 each, Rfl: 3   ibuprofen (ADVIL) 800 MG tablet, Take 1 tablet (800 mg total) by mouth 3 (three) times daily., Disp: 60 tablet, Rfl: 1   insulin aspart (NOVOLOG FLEXPEN) 100 UNIT/ML FlexPen, Inject 15 Units into the skin daily with supper., Disp: 15 mL, Rfl: 11   insulin glargine (LANTUS SOLOSTAR) 100 UNIT/ML Solostar Pen, Inject 16 Units into  the skin every morning. (Patient taking differently: Inject 22 Units into the skin daily.), Disp: 30 mL, Rfl: 2   Insulin Pen Needle (PEN NEEDLES) 32G X 4 MM MISC, 1 each by Does not apply route 4 (four) times daily. E11.9, Disp: 400 each, Rfl: 0   ipratropium (ATROVENT) 0.06 % nasal spray, PLACE 2 SPRAYS INTO BOTH NOSTRILS 4 TIMES DAILY., Disp: 45 mL, Rfl: 3   KLOR-CON M20 20 MEQ tablet, TAKE 1 & 1/2 TABLETS BY MOUTH TWICE A DAY, Disp: 270 tablet, Rfl: 2   Lancet Devices (EASY MINI EJECT LANCING DEVICE) MISC, Use as directed 4 times daily to test blood sugar, Disp: 1 each, Rfl: 3   LINZESS 290 MCG CAPS capsule, TAKE 1 CAPSULE BY MOUTH DAILY BEFORE BREAKFAST. (Patient taking differently: as needed.), Disp: 30 capsule, Rfl: 3   metFORMIN (GLUCOPHAGE-XR) 500 MG 24 hr tablet, TAKE 4 TABLETS BY MOUTH DAILY WITH BREAKFAST, Disp: 360 tablet, Rfl: 0   Microlet Lancets MISC, 1 each by Does not apply route 2 (two) times daily. E11.9, Disp: 100 each, Rfl: 2   nystatin (MYCOSTATIN) 100000 UNIT/ML suspension, Take 5 mLs (500,000 Units total) by mouth 4 (four) times daily., Disp: 180 mL, Rfl: 1   ondansetron (ZOFRAN-ODT) 4 MG disintegrating tablet, Take 1 tablet (4 mg total) by mouth every 8 (eight) hours as needed for nausea or vomiting., Disp: 20 tablet, Rfl: 2   oxyCODONE-acetaminophen (PERCOCET) 5-325 MG tablet, Take 1 tablet by mouth  every 4 (four) hours as needed for severe pain., Disp: 30 tablet, Rfl: 0   pantoprazole (PROTONIX) 40 MG tablet, Take 1 tablet (40 mg total) by mouth 2 (two) times daily., Disp: 180 tablet, Rfl: 3   telmisartan (MICARDIS) 20 MG tablet, TAKE 1 TABLET BY MOUTH EVERY DAY, Disp: 90 tablet, Rfl: 1   telmisartan (MICARDIS) 40 MG tablet, Take 1 tablet (40 mg total) by mouth daily., Disp: 90 tablet, Rfl: 3   venlafaxine XR (EFFEXOR-XR) 75 MG 24 hr capsule, TAKE 1 CAPSULE BY MOUTH TWICE A DAY, Disp: 180 capsule, Rfl: 3   Review of Systems:   Review of Systems  Constitutional:   Negative for fever and malaise/fatigue.  HENT:  Negative for congestion.   Eyes:  Negative for blurred vision.  Respiratory:  Negative for cough and shortness of breath.   Cardiovascular:  Negative for chest pain, palpitations and leg swelling.  Gastrointestinal:  Positive for abdominal pain (RUQ). Negative for constipation and vomiting.  Genitourinary:  Positive for dysuria.       (-) Vaginal discharge  Musculoskeletal:  Negative for back pain.  Skin:  Negative for rash.  Neurological:  Negative for loss of consciousness and headaches.    Vitals:   Vitals:   06/15/23 1411  BP: 134/67  Pulse: 86  Temp: 97.8 F (36.6 C)  TempSrc: Temporal  SpO2: 97%  Weight: 219 lb 12.8 oz (99.7 kg)  Height: 5\' 6"  (1.676 m)     Body mass index is 35.48 kg/m.  Physical Exam:   Physical Exam Vitals and nursing note reviewed.  Constitutional:      General: She is not in acute distress.    Appearance: She is well-developed. She is not ill-appearing or toxic-appearing.  Cardiovascular:     Rate and Rhythm: Normal rate and regular rhythm.     Pulses: Normal pulses.     Heart sounds: Normal heart sounds, S1 normal and S2 normal.  Pulmonary:     Effort: Pulmonary effort is normal.     Breath sounds: Normal breath sounds.  Abdominal:     General: Abdomen is flat. Bowel sounds are normal.     Palpations: Abdomen is soft.     Tenderness: There is abdominal tenderness in the right upper quadrant. There is guarding. There is no right CVA tenderness, left CVA tenderness or rebound.  Skin:    General: Skin is warm and dry.  Neurological:     Mental Status: She is alert.     GCS: GCS eye subscore is 4. GCS verbal subscore is 5. GCS motor subscore is 6.  Psychiatric:        Speech: Speech normal.        Behavior: Behavior normal. Behavior is cooperative.    Results for orders placed or performed in visit on 06/15/23  POCT urinalysis dipstick  Result Value Ref Range   Color, UA light yellow     Clarity, UA clear    Glucose, UA Negative Negative   Bilirubin, UA negative    Ketones, UA 1    Spec Grav, UA 1.015 1.010 - 1.025   Blood, UA negative    pH, UA 6.0 5.0 - 8.0   Protein, UA Negative Negative   Urobilinogen, UA 0.2 0.2 or 1.0 E.U./dL   Nitrite, UA negative    Leukocytes, UA Small (1+) (A) Negative   Appearance     Odor      Assessment and Plan:   Dysuria No red flags on  exam Will send urine off for culture Urinalysis shows small leukocytes, will empirically start oral keflex per orders If new/worsening symptom(s), she was advised to reach out  RUQ abdominal pain She is quite tender on exam but no evidence of acute abdomen Will obtain stat CT abdomen/pel and update blood work DDx includes: IBS, peptic ulcer disease, pancreatitis, colitis, nephrolithiasis, among others If worsening symptom(s) in the meantime, recommend ER evaluation as soon as possible     I,Alexander Ruley,acting as a Neurosurgeon for [employer], PA.,have documented all relevant documentation on the behalf of Jarold Motto, PA,as directed by  Jarold Motto, PA while in the presence of Jarold Motto, [place].  I, Jarold Motto, [place], have reviewed all documentation for this visit. The documentation on 06/15/23 for the exam, diagnosis, procedures, and orders are all accurate and complete.  Jarold Motto, PA-C

## 2023-06-16 ENCOUNTER — Ambulatory Visit (HOSPITAL_BASED_OUTPATIENT_CLINIC_OR_DEPARTMENT_OTHER)
Admission: RE | Admit: 2023-06-16 | Discharge: 2023-06-16 | Disposition: A | Discharge status: Home / Self Care | Payer: Medicare Other | Source: Ambulatory Visit | Attending: Physician Assistant | Admitting: Physician Assistant

## 2023-06-16 DIAGNOSIS — R160 Hepatomegaly, not elsewhere classified: Secondary | ICD-10-CM | POA: Diagnosis not present

## 2023-06-16 DIAGNOSIS — R1011 Right upper quadrant pain: Secondary | ICD-10-CM | POA: Diagnosis not present

## 2023-06-16 DIAGNOSIS — K760 Fatty (change of) liver, not elsewhere classified: Secondary | ICD-10-CM | POA: Diagnosis not present

## 2023-06-16 LAB — COMPREHENSIVE METABOLIC PANEL
ALT: 19 U/L (ref 0–35)
AST: 22 U/L (ref 0–37)
Albumin: 4.4 g/dL (ref 3.5–5.2)
Alkaline Phosphatase: 67 U/L (ref 39–117)
BUN: 19 mg/dL (ref 6–23)
CO2: 25 meq/L (ref 19–32)
Calcium: 9.6 mg/dL (ref 8.4–10.5)
Chloride: 101 meq/L (ref 96–112)
Creatinine, Ser: 1.15 mg/dL (ref 0.40–1.20)
GFR: 50.57 mL/min — ABNORMAL LOW (ref >60.00)
Glucose, Bld: 147 mg/dL — ABNORMAL HIGH (ref 70–99)
Potassium: 4.1 meq/L (ref 3.5–5.1)
Sodium: 136 meq/L (ref 135–145)
Total Bilirubin: 0.6 mg/dL (ref 0.2–1.2)
Total Protein: 7 g/dL (ref 6.0–8.3)

## 2023-06-16 LAB — POCT I-STAT CREATININE: Creatinine, Ser: 1.3 mg/dL — ABNORMAL HIGH (ref 0.44–1.00)

## 2023-06-16 LAB — CBC WITH DIFFERENTIAL/PLATELET
Basophils Absolute: 0.2 10*3/uL — ABNORMAL HIGH (ref 0.0–0.1)
Basophils Relative: 1.5 % (ref 0.0–3.0)
Eosinophils Absolute: 0.4 10*3/uL (ref 0.0–0.7)
Eosinophils Relative: 3.5 % (ref 0.0–5.0)
HCT: 33.1 % — ABNORMAL LOW (ref 36.0–46.0)
Hemoglobin: 10.9 g/dL — ABNORMAL LOW (ref 12.0–15.0)
Lymphocytes Relative: 25.4 % (ref 12.0–46.0)
Lymphs Abs: 3 10*3/uL (ref 0.7–4.0)
MCHC: 33 g/dL (ref 30.0–36.0)
MCV: 89 fl (ref 78.0–100.0)
Monocytes Absolute: 0.8 10*3/uL (ref 0.1–1.0)
Monocytes Relative: 6.8 % (ref 3.0–12.0)
Neutro Abs: 7.4 10*3/uL (ref 1.4–7.7)
Neutrophils Relative %: 62.8 % (ref 43.0–77.0)
Platelets: 372 10*3/uL (ref 150.0–400.0)
RBC: 3.72 Mil/uL — ABNORMAL LOW (ref 3.87–5.11)
RDW: 13.7 % (ref 11.5–15.5)
WBC: 11.8 10*3/uL — ABNORMAL HIGH (ref 4.0–10.5)

## 2023-06-16 LAB — LIPASE: Lipase: 20 U/L (ref 11.0–59.0)

## 2023-06-16 MED ORDER — IOHEXOL 300 MG/ML  SOLN
100.0000 mL | Freq: Once | INTRAMUSCULAR | Status: AC | PRN
  Administered 2023-06-16: 100 mL via INTRAVENOUS

## 2023-06-17 ENCOUNTER — Other Ambulatory Visit: Payer: Self-pay | Admitting: Physician Assistant

## 2023-06-17 LAB — URINE CULTURE
MICRO NUMBER:: 15420210
SPECIMEN QUALITY:: ADEQUATE

## 2023-06-17 MED ORDER — NITROFURANTOIN MONOHYD MACRO 100 MG PO CAPS
100.0000 mg | ORAL_CAPSULE | Freq: Two times a day (BID) | ORAL | 0 refills | Status: DC
Start: 2023-06-17 — End: 2023-08-09

## 2023-06-21 ENCOUNTER — Ambulatory Visit: Payer: Medicare Other | Admitting: Pulmonary Disease

## 2023-06-23 DIAGNOSIS — M654 Radial styloid tenosynovitis [de Quervain]: Secondary | ICD-10-CM | POA: Diagnosis not present

## 2023-06-23 DIAGNOSIS — M25542 Pain in joints of left hand: Secondary | ICD-10-CM | POA: Diagnosis not present

## 2023-06-23 DIAGNOSIS — M1812 Unilateral primary osteoarthritis of first carpometacarpal joint, left hand: Secondary | ICD-10-CM | POA: Diagnosis not present

## 2023-06-23 DIAGNOSIS — M25642 Stiffness of left hand, not elsewhere classified: Secondary | ICD-10-CM | POA: Diagnosis not present

## 2023-06-28 ENCOUNTER — Ambulatory Visit: Payer: Medicare Other | Admitting: Family Medicine

## 2023-07-04 ENCOUNTER — Ambulatory Visit (INDEPENDENT_AMBULATORY_CARE_PROVIDER_SITE_OTHER): Payer: Medicare Other | Admitting: Family Medicine

## 2023-07-04 ENCOUNTER — Encounter: Payer: Self-pay | Admitting: Family Medicine

## 2023-07-04 VITALS — BP 130/74 | HR 71 | Temp 97.2°F | Ht 66.0 in | Wt 222.4 lb

## 2023-07-04 DIAGNOSIS — Z23 Encounter for immunization: Secondary | ICD-10-CM | POA: Diagnosis not present

## 2023-07-04 DIAGNOSIS — E538 Deficiency of other specified B group vitamins: Secondary | ICD-10-CM

## 2023-07-04 DIAGNOSIS — I10 Essential (primary) hypertension: Secondary | ICD-10-CM

## 2023-07-04 DIAGNOSIS — D509 Iron deficiency anemia, unspecified: Secondary | ICD-10-CM | POA: Diagnosis not present

## 2023-07-04 DIAGNOSIS — D860 Sarcoidosis of lung: Secondary | ICD-10-CM | POA: Diagnosis not present

## 2023-07-04 DIAGNOSIS — E1169 Type 2 diabetes mellitus with other specified complication: Secondary | ICD-10-CM | POA: Diagnosis not present

## 2023-07-04 DIAGNOSIS — E785 Hyperlipidemia, unspecified: Secondary | ICD-10-CM | POA: Diagnosis not present

## 2023-07-04 DIAGNOSIS — N1831 Chronic kidney disease, stage 3a: Secondary | ICD-10-CM | POA: Diagnosis not present

## 2023-07-04 NOTE — Patient Instructions (Addendum)
Let us know when you get your COVID vaccine.  we will check B12 with labs - if low on oral you will likely need long term monthly injections  Please stop by lab before you go If you have mychart- we will send your results within 3 business days of [place] receiving them.  If you do not have mychart- we will call you about results within 5 business days of [place] receiving them.  *please also note that you will see labs on mychart as soon as they post. I will later go in and write notes on them- will say "notes from Dr. Durene Cal"   Recommended follow up: Return in about 4 months (around 11/03/2023) for followup or sooner if needed.Schedule b4 you leave.

## 2023-07-04 NOTE — Progress Notes (Signed)
Phone [phone] In person visit   Subjective:   [name] is a 62 y.o. year old very pleasant female patient who presents for/with See problem oriented charting Chief Complaint  Patient presents with   4 month f/u   Past Medical History-  Patient Active Problem List   Diagnosis Date Noted   Severe persistent asthma 01/04/2019    Priority: High   Subcortical microvascular ischemic occlusive disease 07/12/2018    Priority: High   Chronic diastolic CHF (congestive heart failure) (HCC) 08/13/2017    Priority: High   Pulmonary sarcoidosis (HCC)     Priority: High   Chronic bronchitis (HCC) in never smoker 09/18/2013    Priority: High   Chest pain 04/11/2013    Priority: High   Hypertensive heart disease with chronic diastolic congestive heart failure (HCC)     Priority: High   Sarcoidosis of lung (HCC) 04/10/2007    Priority: High   Type II diabetes mellitus with renal manifestations (HCC) 08/21/2006    Priority: High   Iron deficiency anemia 10/01/2020    Priority: Medium    Hypertriglyceridemia 04/02/2020    Priority: Medium    Aortic atherosclerosis (HCC) 02/14/2020    Priority: Medium    Constipation 04/21/2018    Priority: Medium    Epiglottitis     Priority: Medium    Osteoarthritis of right knee 01/26/2017    Priority: Medium    CKD (chronic kidney disease) stage 3, GFR 30-59 ml/min (HCC) 04/27/2015    Priority: Medium    History of osteomyelitis 03/27/2015    Priority: Medium    Fatty liver 09/30/2014    Priority: Medium    Major depression in full remission (HCC)     Priority: Medium    Gout 08/20/2010    Priority: Medium    Deficiency anemia 09/18/2009    Priority: Medium    Sleep apnea 04/21/2009    Priority: Medium    Hyperlipidemia associated with type 2 diabetes mellitus (HCC) 08/21/2006    Priority: Medium    Hypertension 08/21/2006    Priority: Medium    Nontraumatic complete tear of left rotator cuff     Priority: Low   Diabetic  neuropathy (HCC) 02/20/2019    Priority: Low   Rapid palpitations 07/12/2018    Priority: Low   Impingement of left ankle joint 06/26/2018    Priority: Low   Total knee replacement status, left 12/07/2017    Priority: Low   Dysphagia 09/20/2017    Priority: Low   Plantar fasciitis, right 07/13/2017    Priority: Low   S/P total knee arthroplasty, right 06/29/2017    Priority: Low   Osteoarthrosis, localized, primary, knee, right     Priority: Low   Sprain of calcaneofibular ligament of right ankle 06/06/2017    Priority: Low   Onychomycosis 10/27/2015    Priority: Low   MRSA (methicillin resistant staph aureus) culture positive 03/27/2015    Priority: Low   Hot flashes 07/15/2014    Priority: Low   Abnormal SPEP 04/17/2014    Priority: Low   Fracture of left leg 04/17/2014    Priority: Low   Internal hemorrhoids     Priority: Low   Preoperative clearance 03/25/2014    Priority: Low   Solitary pulmonary nodule, on CT 02/2013 - stable over 2 years in 2015 02/20/2013    Priority: Low   GERD 08/21/2006    Priority: Low   Chronic pain of left wrist 04/19/2022  Priority: 1.   Facet arthropathy, cervical 06/29/2021    Priority: 1.   Foraminal stenosis of cervical region 06/29/2021    Priority: 1.   Preop cardiovascular exam 12/07/2021   Postural dizziness with presyncope 09/30/2020    Medications- reviewed and updated Current Outpatient Medications  Medication Sig Dispense Refill   albuterol (PROVENTIL) (2.5 MG/3ML) 0.083% nebulizer solution Take 2.5 mg by nebulization every 6 (six) hours as needed for wheezing or shortness of breath.     allopurinol (ZYLOPRIM) 300 MG tablet Take 1 tablet (300 mg total) by mouth daily. 90 tablet 0   aspirin EC 81 MG tablet Take 1 tablet (81 mg total) by mouth daily. 30 tablet 11   atorvastatin (LIPITOR) 40 MG tablet TAKE 1 TABLET BY MOUTH EVERY DAY (Patient taking differently: Take 40 mg by mouth daily.) 90 tablet 3   BREO ELLIPTA 200-25  MCG/INH AEPB TAKE 1 PUFF BY MOUTH EVERY DAY (Patient taking differently: Inhale 1 puff into the lungs daily.) 60 each 5   buPROPion (WELLBUTRIN XL) 300 MG 24 hr tablet TAKE 1 TABLET BY MOUTH EVERY DAY 90 tablet 3   Continuous Glucose Sensor (DEXCOM G7 SENSOR) MISC 1 DEVICE BY DOES NOT APPLY ROUTE AS DIRECTED. CHANGE SENSOR EVERY 10 DAYS 3 each 3   diltiazem (CARDIZEM CD) 120 MG 24 hr capsule Take 1 capsule (120 mg total) by mouth daily. 90 capsule 3   Dulaglutide (TRULICITY) 4.5 MG/0.5ML SOPN Inject 4.5 mg as directed once a week. 6 mL 3   estradiol (ESTRACE) 0.1 MG/GM vaginal cream Place 0.5 Applicatorfuls vaginally 2 (two) times a week.     fenofibrate 160 MG tablet TAKE 1 TABLET BY MOUTH EVERY DAY 90 tablet 1   fluticasone (FLONASE) 50 MCG/ACT nasal spray Place 2 sprays into both nostrils daily as needed for allergies. (Patient taking differently: Place 1 spray into both nostrils daily.) 48 mL 3   furosemide (LASIX) 40 MG tablet Take 1 tablet (40 mg total) by mouth daily.     gabapentin (NEURONTIN) 300 MG capsule TAKE AT NIGHT IN ADDITION TO THE 100MG  GABAPENTIN TO GET 400MG  AT NIGHT AND 100MG  IN THE MORNING AND AT MID DAY. 90 capsule 0   glucose blood (CONTOUR NEXT TEST) test strip 1 each by Other route 4 (four) times daily. And lancets 4/day 400 each 3   ibuprofen (ADVIL) 800 MG tablet Take 1 tablet (800 mg total) by mouth 3 (three) times daily. 60 tablet 1   insulin aspart (NOVOLOG FLEXPEN) 100 UNIT/ML FlexPen Inject 15 Units into the skin daily with supper. 15 mL 11   insulin glargine (LANTUS SOLOSTAR) 100 UNIT/ML Solostar Pen Inject 16 Units into the skin every morning. (Patient taking differently: Inject 22 Units into the skin daily.) 30 mL 2   Insulin Pen Needle (PEN NEEDLES) 32G X 4 MM MISC 1 each by Does not apply route 4 (four) times daily. E11.9 400 each 0   ipratropium (ATROVENT) 0.06 % nasal spray PLACE 2 SPRAYS INTO BOTH NOSTRILS 4 TIMES DAILY. 45 mL 3   KLOR-CON M20 20 MEQ tablet  TAKE 1 & 1/2 TABLETS BY MOUTH TWICE A DAY 270 tablet 2   Lancet Devices (EASY MINI EJECT LANCING DEVICE) MISC Use as directed 4 times daily to test blood sugar 1 each 3   LINZESS 290 MCG CAPS capsule TAKE 1 CAPSULE BY MOUTH DAILY BEFORE BREAKFAST. (Patient taking differently: as needed.) 30 capsule 3   metFORMIN (GLUCOPHAGE-XR) 500 MG 24 hr tablet TAKE  4 TABLETS BY MOUTH DAILY WITH BREAKFAST 360 tablet 0   Microlet Lancets MISC 1 each by Does not apply route 2 (two) times daily. E11.9 100 each 2   nitrofurantoin, macrocrystal-monohydrate, (MACROBID) 100 MG capsule Take 1 capsule (100 mg total) by mouth 2 (two) times daily. 10 capsule 0   nystatin (MYCOSTATIN) 100000 UNIT/ML suspension Take 5 mLs (500,000 Units total) by mouth 4 (four) times daily. 180 mL 1   pantoprazole (PROTONIX) 40 MG tablet Take 1 tablet (40 mg total) by mouth 2 (two) times daily. 180 tablet 3   telmisartan (MICARDIS) 20 MG tablet TAKE 1 TABLET BY MOUTH EVERY DAY 90 tablet 1   telmisartan (MICARDIS) 40 MG tablet Take 1 tablet (40 mg total) by mouth daily. 90 tablet 3   venlafaxine XR (EFFEXOR-XR) 75 MG 24 hr capsule TAKE 1 CAPSULE BY MOUTH TWICE A DAY 180 capsule 3   benzonatate (TESSALON PERLES) 100 MG capsule Take 1-2 capsules (100-200 mg total) by mouth 3 (three) times daily as needed for cough. (Patient not taking: Reported on 07/04/2023) 30 capsule 0   chlorpheniramine-HYDROcodone (TUSSIONEX) 10-8 MG/5ML Take 5 mLs by mouth at bedtime as needed for cough (do not drive for 8 hours after taking). (Patient not taking: Reported on 07/04/2023) 115 mL 0   cyclobenzaprine (FLEXERIL) 5 MG tablet Take 1 tablet (5 mg total) by mouth 3 (three) times daily as needed for muscle spasms (or Headache). (Patient not taking: Reported on 07/04/2023) 30 tablet 0   ondansetron (ZOFRAN-ODT) 4 MG disintegrating tablet Take 1 tablet (4 mg total) by mouth every 8 (eight) hours as needed for nausea or vomiting. (Patient not taking: Reported on 07/04/2023)  20 tablet 2   oxyCODONE-acetaminophen (PERCOCET) 5-325 MG tablet Take 1 tablet by mouth every 4 (four) hours as needed for severe pain. (Patient not taking: Reported on 07/04/2023) 30 tablet 0   No current facility-administered medications for this visit.     Objective:  BP 130/74   Pulse 71   Temp (!) 97.2 F (36.2 C)   Ht 5\' 6"  (1.676 m)   Wt 222 lb 6.4 oz (100.9 kg)   SpO2 95%   BMI 35.90 kg/m  Gen: NAD, resting comfortably CV: RRR no murmurs rubs or gallops Lungs: CTAB no crackles, wheeze, rhonchi Abdomen: soft/nontender/nondistended/normal bowel sounds. No rebound or guarding.  Ext: no edema Skin: warm, dry Neuro: grossly normal, moves all extremities       Assessment and Plan   #recent urinary tract infection- seemed to cause abdominal pain as well- she is doing much better  #[facility] for [facility] thumb, carpal tunnel- healing well  # Depression/hot flashes S: Compliant with Wellbutrin 300 xr, Effexor xr 75mg   - prior paxil 7.5mg  (for hot flashes).     06/15/2023    2:22 PM 03/15/2023   11:32 AM 02/25/2023    8:43 AM  Depression screen PHQ 2/9  Decreased Interest 0 0 0  Down, Depressed, Hopeless 0 0 0  PHQ - 2 Score 0 0 0  Altered sleeping  0 0  Tired, decreased energy  0 0  Change in appetite  0 0  Feeling bad or failure about yourself   0 0  Trouble concentrating  0 0  Moving slowly or fidgety/restless  0 0  Suicidal thoughts  0 0  PHQ-9 Score  0 0  Difficult doing work/chores  Not difficult at all Not difficult at all  A/P: full remission- continue current medications   #  hypertension/hypertensive heart disease with chronic diastolic CHF/CKD stage III S: compliant with Lasix 40 mg daily (also potassium 20 meq), diltiazem extended release 120 mg,  telmisartan 20 mg--> 40 mg on 12/10/22 -prior on imdur on list  -prior coreg 25 mg- appears accidentally stopped Renal function stable with GFR of  near 51 BP Readings from Last 3 Encounters:  07/04/23  130/74  06/15/23 134/67  03/21/23 (!) 144/96  A/P: hypertension stable- continue current medicines  CKD III stable- continue current medicines   # hyperlipidemia #aortic atherosclerosis S:compliant with atorvastatin 40 mg, fenofibrate 160mg .  Despite this triglycerides remain high and she was started on Vascepa by cardiology  Lab Results  Component Value Date   CHOL 137 10/12/2022   HDL 50.60 10/12/2022   LDLCALC 56 10/12/2022   LDLDIRECT 84.0 07/21/2021   TRIG 154.0 (H) 10/12/2022   CHOLHDL 3 10/12/2022   A/P: stable- continue current medicines    #Sarcoidosis of lung- prior seen by [facility]- now only local S: Obstructive chronic bronchitis without exacerbation /Severe persistent asthma without complication - off long term prednisone due to osteoporosis S: Medication:  breo 200-25mg  1 puff every day.  Rare albuterol with colds A/P: reasonable control lately- continue to monitor   #Type 2 diabetes mellitus/morbid obesity-sees Dr. Roosevelt Locks [place] Lab Results  Component Value Date   HGBA1C 6.9 (A) 03/21/2023   HGBA1C 8.2 (H) 12/13/2022   HGBA1C 8.1 (H) 10/12/2022   #Gout # arthritic issues- s/p bilateral total knee replacement. 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 Noted - trend cbc. Has seen oncology in past for positive SPEP but ultimately thought related to sarcoid . iron infusions at times with hematology. On oral iron daily  Lab Results  Component Value Date   FERRITIN 115.9 02/25/2023   #B12 deficiency S: discovered by Jarold Motto PA <200. doing once weekly injections for 6 weeks and then once a month after that- but has been sometime - she is taking oral b12  A/P: we will check B12 with labs - if low on oral you will likely  need long term monthly injections  #Constipation- linzess through gastroenterology in past- not needing much   Recommended follow up: Return in about 4 months (around 11/03/2023) for followup or sooner if needed.Schedule b4 you leave. Future Appointments  Date Time Provider Department Center  07/21/2023  8:00 AM Altamese Storla, MD [facility] None  08/23/2023  3:00 PM Mannam, Colbert Coyer, MD [facility] None  03/20/2024 11:45 AM [facility] ANNUAL WELLNESS VISIT 1 [facility]    Lab/Order associations:   ICD-10-CM   1. Primary hypertension  I10     2. Hyperlipidemia associated with type 2 diabetes mellitus (HCC)  E11.69    E78.5     3. Iron deficiency anemia, unspecified iron deficiency anemia type  D50.9 IBC + Ferritin    CBC with Differential/Platelet    4. B12 deficiency  E53.8 Vitamin B12    5. Sarcoidosis of lung (HCC)  D86.0     6. Stage 3a chronic kidney disease (HCC)  N18.31     7. Need for influenza vaccination  Z23 Flu vaccine trivalent PF, 6mos and older(Flulaval,Afluria,Fluarix,Fluzone)      No orders of the defined types were placed in this encounter.  Return precautions advised.  Tana Conch, MD

## 2023-07-05 LAB — CBC WITH DIFFERENTIAL/PLATELET
Basophils Absolute: 0 10*3/uL (ref 0.0–0.1)
Basophils Relative: 0.2 % (ref 0.0–3.0)
Eosinophils Absolute: 0.4 10*3/uL (ref 0.0–0.7)
Eosinophils Relative: 3.4 % (ref 0.0–5.0)
HCT: 33.8 % — ABNORMAL LOW (ref 36.0–46.0)
Hemoglobin: 11 g/dL — ABNORMAL LOW (ref 12.0–15.0)
Lymphocytes Relative: 26.2 % (ref 12.0–46.0)
Lymphs Abs: 2.7 10*3/uL (ref 0.7–4.0)
MCHC: 32.4 g/dL (ref 30.0–36.0)
MCV: 91 fl (ref 78.0–100.0)
Monocytes Absolute: 0.7 10*3/uL (ref 0.1–1.0)
Monocytes Relative: 6.4 % (ref 3.0–12.0)
Neutro Abs: 6.6 10*3/uL (ref 1.4–7.7)
Neutrophils Relative %: 63.8 % (ref 43.0–77.0)
Platelets: 365 10*3/uL (ref 150.0–400.0)
RBC: 3.71 Mil/uL — ABNORMAL LOW (ref 3.87–5.11)
RDW: 14 % (ref 11.5–15.5)
WBC: 10.4 10*3/uL (ref 4.0–10.5)

## 2023-07-05 LAB — IBC + FERRITIN
Ferritin: 138.1 ng/mL (ref 10.0–291.0)
Iron: 64 ug/dL (ref 42–145)
Saturation Ratios: 14 % — ABNORMAL LOW (ref 20.0–50.0)
TIBC: 456.4 ug/dL — ABNORMAL HIGH (ref 250.0–450.0)
Transferrin: 326 mg/dL (ref 212.0–360.0)

## 2023-07-05 LAB — VITAMIN B12: Vitamin B-12: 731 pg/mL (ref 211–911)

## 2023-07-06 DIAGNOSIS — M1812 Unilateral primary osteoarthritis of first carpometacarpal joint, left hand: Secondary | ICD-10-CM | POA: Diagnosis not present

## 2023-07-06 DIAGNOSIS — M25542 Pain in joints of left hand: Secondary | ICD-10-CM | POA: Diagnosis not present

## 2023-07-06 DIAGNOSIS — M25642 Stiffness of left hand, not elsewhere classified: Secondary | ICD-10-CM | POA: Diagnosis not present

## 2023-07-11 DIAGNOSIS — Z01419 Encounter for gynecological examination (general) (routine) without abnormal findings: Secondary | ICD-10-CM | POA: Diagnosis not present

## 2023-07-15 ENCOUNTER — Other Ambulatory Visit: Payer: Self-pay | Admitting: Family Medicine

## 2023-07-15 ENCOUNTER — Encounter: Payer: Self-pay | Admitting: "Endocrinology

## 2023-07-15 DIAGNOSIS — Z0000 Encounter for general adult medical examination without abnormal findings: Secondary | ICD-10-CM

## 2023-07-18 ENCOUNTER — Other Ambulatory Visit: Payer: Self-pay

## 2023-07-18 DIAGNOSIS — E1165 Type 2 diabetes mellitus with hyperglycemia: Secondary | ICD-10-CM

## 2023-07-18 MED ORDER — DEXCOM G7 SENSOR MISC: 1.0000 | 3 refills | Status: DC

## 2023-07-19 ENCOUNTER — Ambulatory Visit: Payer: Medicare Other

## 2023-07-21 ENCOUNTER — Ambulatory Visit: Payer: Medicare Other | Admitting: "Endocrinology

## 2023-07-27 ENCOUNTER — Encounter: Payer: Self-pay | Admitting: "Endocrinology

## 2023-07-27 ENCOUNTER — Ambulatory Visit (INDEPENDENT_AMBULATORY_CARE_PROVIDER_SITE_OTHER): Payer: Medicare Other | Admitting: "Endocrinology

## 2023-07-27 VITALS — BP 130/64 | HR 82 | Ht 66.0 in | Wt 217.4 lb

## 2023-07-27 DIAGNOSIS — Z794 Long term (current) use of insulin: Secondary | ICD-10-CM

## 2023-07-27 DIAGNOSIS — E782 Mixed hyperlipidemia: Secondary | ICD-10-CM

## 2023-07-27 DIAGNOSIS — Z7985 Long-term (current) use of injectable non-insulin antidiabetic drugs: Secondary | ICD-10-CM

## 2023-07-27 DIAGNOSIS — E1165 Type 2 diabetes mellitus with hyperglycemia: Secondary | ICD-10-CM | POA: Diagnosis not present

## 2023-07-27 DIAGNOSIS — Z7984 Long term (current) use of oral hypoglycemic drugs: Secondary | ICD-10-CM

## 2023-07-27 LAB — POCT GLYCOSYLATED HEMOGLOBIN (HGB A1C): Hemoglobin A1C: 7 % — AB (ref 4.0–5.6)

## 2023-07-27 NOTE — Patient Instructions (Signed)

## 2023-07-27 NOTE — Progress Notes (Signed)
Outpatient Endocrinology Note Altamese Bensley, MD  07/27/23   [name] 1961-07-18 [id number]  Referring Provider: Shelva Majestic, MD Primary Care Provider: Shelva Majestic, MD Reason for consultation: Subjective   Assessment & Plan  Diagnoses and all orders for this visit:  Uncontrolled type 2 diabetes mellitus with hyperglycemia, with long-term current use of insulin (HCC) -     POCT glycosylated hemoglobin (Hb A1C) -     Lipid panel; Future  Long term (current) use of oral hypoglycemic drugs  Long-term (current) use of injectable non-insulin antidiabetic drugs  Long-term insulin use (HCC)  Mixed hypercholesterolemia and hypertriglyceridemia   Diabetes complicated by nephropathy Hba1c goal less than 7.0, current Hba1c is 7. Will recommend the following: Metformin 2000 mg a day, Trulicity 4.5 mg weekly Lantus 24 units at am, Premeal NovoLog 05-20-13 fifteen min before meals  No known contraindications to any of above medications  Hyperlipidemia -Last LDL at goal: 56, Tg 154 -on atorvastatin 40 mg QD and fenofibrate 160 mg qd -Follow low fat diet and exercise    -Blood pressure goal <140/90 - Microalbumin/creatinine at goal < 30 - on ACE/ARB Telmisartan 40 mg qd -diet changes including salt restriction -limit eating outside -counseled BP targets per standards of diabetes care -Uncontrolled blood pressure can lead to retinopathy, nephropathy and cardiovascular and atherosclerotic heart disease  Reviewed and counseled on: -A1C target -Blood sugar targets -Complications of uncontrolled diabetes  -Checking blood sugar before meals and bedtime and bring log next visit -All medications with mechanism of action and side effects -Hypoglycemia management: rule of 15's, Glucagon Emergency Kit and medical alert ID -low-carb low-fat plate-method diet -At least 20 minutes of physical activity per day -Annual dilated retinal eye exam and foot  exam -compliance and follow up needs -follow up as scheduled or earlier if problem gets worse  Call if blood sugar is less than 70 or consistently above 250    Take a 15 gm snack of carbohydrate at bedtime before you go to sleep if your blood sugar is less than 100.    If you are going to fast after midnight for a test or procedure, ask your physician for instructions on how to reduce/decrease your insulin dose.    Call if blood sugar is less than 70 or consistently above 250  -Treating a low sugar by rule of 15  (15 gms of sugar every 15 min until sugar is more than 70) If you feel your sugar is low, test your sugar to be sure If your sugar is low (less than 70), then take 15 grams of a fast acting Carbohydrate (3-4 glucose tablets or glucose gel or 4 ounces of juice or regular soda) Recheck your sugar 15 min after treating low to make sure it is more than 70 If sugar is still less than 70, treat again with 15 grams of carbohydrate          Don't drive the hour of hypoglycemia  If unconscious/unable to eat or drink by mouth, use glucagon injection or nasal spray baqsimi and call 911. Can repeat again in 15 min if still unconscious.  Return in about 4 weeks (around 08/24/2023) for 8 am lab the day of next visit.   I have reviewed current medications, nurse's notes, allergies, vital signs, past medical and surgical history, family medical history, and social history for this encounter. Counseled patient on symptoms, examination findings, lab findings, imaging results, treatment decisions and monitoring and prognosis. The  patient understood the recommendations and agrees with the treatment plan. All questions regarding treatment plan were fully answered.  Altamese [place], MD  07/27/23   History of Present Illness LEKESHA [name] is a 62 y.o. year old female who presents for evaluation of Type 2 diabetes mellitus.  [name] was first diagnosed in 2007.    Home diabetes  regimen: Metformin 2000 mg a day, Trulicity 4.5 mg weekly Lantus 18 units at am, Premeal NovoLog 05-20-13   Previous history:  Non-insulin hypoglycemic drugs previously used: Ozempic, metformin Insulin was started a few years after diagnosis Side effects from medications: Ozempic: nausea  COMPLICATIONS -  MI/Stroke -  retinopathy +  neuropathy-sees podiatrist annually Dr Logan Bores  +  nephropathy  BLOOD SUGAR DATA  CGM interpretation: At today's visit, we reviewed her CGM downloads. The full report is scanned in the media. Reviewing the CGM trends, Bg show uptrend trend throughout the day.  Physical Exam  BP 130/64   Pulse 82   Ht 5\' 6"  (1.676 m)   Wt 217 lb 6.4 oz (98.6 kg)   SpO2 96%   BMI 35.09 kg/m    Constitutional: well developed, well nourished Head: normocephalic, atraumatic Eyes: sclera anicteric, no redness Neck: supple Lungs: normal respiratory effort Neurology: alert and oriented Skin: dry, no appreciable rashes Musculoskeletal: no appreciable defects Psychiatric: normal mood and affect   Current Medications Patient's Medications  New Prescriptions   No medications on file  Previous Medications   ALBUTEROL (PROVENTIL) (2.5 MG/3ML) 0.083% NEBULIZER SOLUTION    Take 2.5 mg by nebulization every 6 (six) hours as needed for wheezing or shortness of breath.   ALLOPURINOL (ZYLOPRIM) 300 MG TABLET    Take 1 tablet (300 mg total) by mouth daily.   ASPIRIN EC 81 MG TABLET    Take 1 tablet (81 mg total) by mouth daily.   ATORVASTATIN (LIPITOR) 40 MG TABLET    TAKE 1 TABLET BY MOUTH EVERY DAY   BENZONATATE (TESSALON PERLES) 100 MG CAPSULE    Take 1-2 capsules (100-200 mg total) by mouth 3 (three) times daily as needed for cough.   BREO ELLIPTA 200-25 MCG/INH AEPB    TAKE 1 PUFF BY MOUTH EVERY DAY   BUPROPION (WELLBUTRIN XL) 300 MG 24 HR TABLET    TAKE 1 TABLET BY MOUTH EVERY DAY   CHLORPHENIRAMINE-HYDROCODONE (TUSSIONEX) 10-8 MG/5ML    Take 5 mLs by mouth at bedtime as  needed for cough (do not drive for 8 hours after taking).   CONTINUOUS GLUCOSE SENSOR (DEXCOM G7 SENSOR) MISC    1 Device by Does not apply route as directed. Change sensor every 10 days   CYCLOBENZAPRINE (FLEXERIL) 5 MG TABLET    Take 1 tablet (5 mg total) by mouth 3 (three) times daily as needed for muscle spasms (or Headache).   DILTIAZEM (CARDIZEM CD) 120 MG 24 HR CAPSULE    Take 1 capsule (120 mg total) by mouth daily.   DULAGLUTIDE (TRULICITY) 4.5 MG/0.5ML SOPN    Inject 4.5 mg as directed once a week.   ESTRADIOL (ESTRACE) 0.1 MG/GM VAGINAL CREAM    Place 0.5 Applicatorfuls vaginally 2 (two) times a week.   FENOFIBRATE 160 MG TABLET    TAKE 1 TABLET BY MOUTH EVERY DAY   FLUTICASONE (FLONASE) 50 MCG/ACT NASAL SPRAY    Place 2 sprays into both nostrils daily as needed for allergies.   FUROSEMIDE (LASIX) 40 MG TABLET    Take 1 tablet (40 mg total)  by mouth daily.   GABAPENTIN (NEURONTIN) 300 MG CAPSULE    TAKE AT NIGHT IN ADDITION TO THE 100MG  GABAPENTIN TO GET 400MG  AT NIGHT AND 100MG  IN THE MORNING AND AT MID DAY.   GLUCOSE BLOOD (CONTOUR NEXT TEST) TEST STRIP    1 each by Other route 4 (four) times daily. And lancets 4/day   IBUPROFEN (ADVIL) 800 MG TABLET    Take 1 tablet (800 mg total) by mouth 3 (three) times daily.   INSULIN ASPART (NOVOLOG FLEXPEN) 100 UNIT/ML FLEXPEN    Inject 15 Units into the skin daily with supper.   INSULIN GLARGINE (LANTUS SOLOSTAR) 100 UNIT/ML SOLOSTAR PEN    Inject 16 Units into the skin every morning.   INSULIN PEN NEEDLE (PEN NEEDLES) 32G X 4 MM MISC    1 each by Does not apply route 4 (four) times daily. E11.9   IPRATROPIUM (ATROVENT) 0.06 % NASAL SPRAY    PLACE 2 SPRAYS INTO BOTH NOSTRILS 4 TIMES DAILY.   KLOR-CON M20 20 MEQ TABLET    TAKE 1 & 1/2 TABLETS BY MOUTH TWICE A DAY   LANCET DEVICES (EASY MINI EJECT LANCING DEVICE) MISC    Use as directed 4 times daily to test blood sugar   LINZESS 290 MCG CAPS CAPSULE    TAKE 1 CAPSULE BY MOUTH DAILY BEFORE  BREAKFAST.   METFORMIN (GLUCOPHAGE-XR) 500 MG 24 HR TABLET    TAKE 4 TABLETS BY MOUTH DAILY WITH BREAKFAST   MICROLET LANCETS MISC    1 each by Does not apply route 2 (two) times daily. E11.9   NITROFURANTOIN, MACROCRYSTAL-MONOHYDRATE, (MACROBID) 100 MG CAPSULE    Take 1 capsule (100 mg total) by mouth 2 (two) times daily.   NYSTATIN (MYCOSTATIN) 100000 UNIT/ML SUSPENSION    Take 5 mLs (500,000 Units total) by mouth 4 (four) times daily.   ONDANSETRON (ZOFRAN-ODT) 4 MG DISINTEGRATING TABLET    Take 1 tablet (4 mg total) by mouth every 8 (eight) hours as needed for nausea or vomiting.   OXYCODONE-ACETAMINOPHEN (PERCOCET) 5-325 MG TABLET    Take 1 tablet by mouth every 4 (four) hours as needed for severe pain.   PANTOPRAZOLE (PROTONIX) 40 MG TABLET    Take 1 tablet (40 mg total) by mouth 2 (two) times daily.   TELMISARTAN (MICARDIS) 40 MG TABLET    Take 1 tablet (40 mg total) by mouth daily.   VENLAFAXINE XR (EFFEXOR-XR) 75 MG 24 HR CAPSULE    TAKE 1 CAPSULE BY MOUTH TWICE A DAY  Modified Medications   No medications on file  Discontinued Medications   TELMISARTAN (MICARDIS) 20 MG TABLET    TAKE 1 TABLET BY MOUTH EVERY DAY    Allergies Allergies  Allergen Reactions   Firvanq [Vancomycin] Other (See Comments)    Dose related nephrotoxicity    Trexall [Methotrexate] Other (See Comments)    Peri-oral and buccal lesions   Zestril [Lisinopril] Cough   Chlorhexidine Itching   Cleocin [Clindamycin] Nausea And Vomiting and Rash   Lincocin [Lincomycin Hcl] Nausea And Vomiting and Rash   Teflaro [Ceftaroline] Rash and Other (See Comments)    Tolerates ceftriaxone     Past Medical History Past Medical History:  Diagnosis Date   Abnormal SPEP 04/17/2014   Acute left ankle pain 01/26/2017   ANEMIA-UNSPECIFIED 09/18/2009   Anxiety    Arthritis    Blood transfusion without reported diagnosis    Cataract    CHF (congestive heart failure) (HCC)    Chronic  diastolic heart failure, NYHA class 2  (HCC)    Normal LVEDP by May 2018   COPD (chronic obstructive pulmonary disease) (HCC)    Depression    DIABETES MELLITUS, TYPE II 08/21/2006   Diabetic osteomyelitis (HCC) 05/29/2015   Fatty liver    Fracture of 5th metatarsal    non union   GERD 08/21/2006   Had fundoplication   GOUT 08/20/2010   Hammer toe of right foot    2-5th toes   Hx of umbilical hernia repair    HYPERLIPIDEMIA 08/21/2006   HYPERTENSION 08/21/2006   Infection of wound due to methicillin resistant Staphylococcus aureus (MRSA)    Internal hemorrhoids    Kidney problem    Multiple allergies 10/14/2016   OBESITY 06/04/2009   Onychomycosis 10/27/2015   Osteomyelitis of left foot (HCC) 05/29/2015   Osteoporosis    Pulmonary sarcoidosis (HCC)    Followed locally by pulmonology, but also by [facility]. Sandy Sal[facility] at [facility]   Right knee pain 01/26/2017   Vocal cord dysfunction    Wears partial dentures     Past Surgical History Past Surgical History:  Procedure Laterality Date   ABDOMINAL HYSTERECTOMY     APPENDECTOMY     BLADDER SUSPENSION  11/11/2011   Procedure: TRANSVAGINAL TAPE (TVT) PROCEDURE;  Surgeon: Levi Aland, MD;  Location: [facility];  Service: Gynecology;  Laterality: N/A;   CAROTIDS  02/18/2011   CAROTID DUPLEX; VERTEBRALS ARE PATENT WITH ANTEGRADE FLOW. ICA/CCA RATIO 1.61 ON RIGHT AND 0.75 ON LEFT   CATARACT EXTRACTION, BILATERAL     CHOLECYSTECTOMY  1984   COLONOSCOPY  04/29/2010   Marina Goodell   CYSTOSCOPY  11/11/2011   Procedure: CYSTOSCOPY;  Surgeon: Levi Aland, MD;  Location: [facility];  Service: Gynecology;  Laterality: N/A;   EXTUBATION (ENDOTRACHEAL) IN OR N/A 09/23/2017   Procedure: EXTUBATION (ENDOTRACHEAL) IN OR;  Surgeon: Graylin Shiver, MD;  Location: [facility];  Service: ENT;  Laterality: N/A;   FIBEROPTIC LARYNGOSCOPY AND TRACHEOSCOPY N/A 09/23/2017   Procedure: FLEXIBLE FIBEROPTIC LARYNGOSCOPY;  Surgeon: Graylin Shiver, MD;  Location: [facility];  Service: ENT;   Laterality: N/A;   FRACTURE SURGERY     foot   HALLUX FUSION Left 10/02/2018   Procedure: HALLUX ARTHRODESIS;  Surgeon: Felecia Shelling, DPM;  Location: [facility];  Service: Podiatry;  Laterality: Left;   HAMMER TOE SURGERY Left 10/02/2018   Procedure: HAMMER TOE CORRECTION 2ND 3RD 4RD FIFTH TOE;  Surgeon: Felecia Shelling, DPM;  Location: [facility];  Service: Podiatry;  Laterality: Left;   HAMMER TOE SURGERY Right 04/12/2019   Procedure: HAMMER TOE CORRECTION, 2ND, 3RD, 4TH AND 5TH TOES OF RIGHT FOOT;  Surgeon: Felecia Shelling, DPM;  Location: [facility];  Service: Podiatry;  Laterality: Right;   HAMMER TOE SURGERY Left 10/25/2019   Procedure: HAMMER TOE REPAIR SECOND, THIRD, FOUTH;  Surgeon: Felecia Shelling, DPM;  Location: [facility];  Service: Podiatry;  Laterality: Left;   HERNIA REPAIR     I & D EXTREMITY Left 06/27/2015   Procedure: Partial Excision Left Calcaneus, Place Antibiotic Beads, and Wound VAC;  Surgeon: Nadara Mustard, MD;  Location: [facility];  Service: Orthopedics;  Laterality: Left;   JOINT REPLACEMENT     KNEE ARTHROSCOPY     right   LEFT AND RIGHT HEART CATHETERIZATION WITH CORONARY ANGIOGRAM N/A 04/23/2013   Procedure: LEFT AND RIGHT HEART CATHETERIZATION WITH CORONARY ANGIOGRAM;  Surgeon: Marykay Lex, MD;  Location: [facility] CATH  LAB;  Service: Cardiovascular;  Laterality: N/A;   LEFT HEART CATH AND CORONARY ANGIOGRAPHY N/A 03/11/2017   Procedure: Left Heart Cath and Coronary Angiography;  Surgeon: Tonny Bollman, MD;  Location: [facility] INVASIVE CV LAB; angiographically minimal CAD in the LAD otherwise normal.;  Normal LVEDP.  FALSE POSITIVE MYOVIEW   LEFT HEART CATH AND CORONARY ANGIOGRAPHY  07/20/2010   LVEF 50-55% WITH VERY MILD GLOBAL HYPOKINESIA; ESSENTIALLY NORMAL CORONARY ARTERIES; NORMAL LV FUNCTION   METATARSAL OSTEOTOMY WITH OPEN REDUCTION INTERNAL FIXATION (ORIF) METATARSAL WITH FUSION Left 04/09/2014   Procedure: LEFT FOOT FRACTURE OPEN TREATMENT METATARSAL INCLUDES INTERNAL FIXATION  EACH;  Surgeon: Nilda Simmer, MD;  Location: [facility];  Service: Orthopedics;  Laterality: Left;   NISSEN FUNDOPLICATION  2004   NM MYOVIEW LTD --> FALSE POSITIVE  03/10/2017   : Moderate size "stress-induced "perfusion defect at the apex as well as "ill-defined stress-induced perfusion defect in the lateral wall.  EF 55%.  INTERMEDIATE risk. -->  FALSE POSITIVE   ORIF DISTAL FEMUR FRACTURE  08/08/2020   [facility]: ORIF of left extra-articular periprosthetic distal femur fracture with synthesis of the femur plate with cortical nonlocking screws.  (Thought to be pathologic fracture due to osteoporosis) -> after a fall   ORIF FEMUR W/ PERI-IMPLANT  08/29/2020   [facility]) revision of left femur fracture ORIF plate fixation screws; culture positive for MSSA   Right and left CARDIAC CATHETERIZATION  04/23/2013   Angiographic normal coronaries; LVEDP 20 mmHg, PCWP 12-14 mmHg, RAP 12 mmHg.; Fick CO/CI 4.9/2.2   ROTATOR CUFF REPAIR Right 12/09/2021   SHOULDER ARTHROSCOPY Left 03/14/2019   Procedure: LEFT SHOULDER ARTHROSCOPY, DEBRIDEMENT, AND DECOMPRESSION;  Surgeon: Nadara Mustard, MD;  Location[facility];  Service: Orthopedics;  Laterality: Left;   TOTAL KNEE ARTHROPLASTY Right 06/29/2017   Procedure: RIGHT TOTAL KNEE ARTHROPLASTY;  Surgeon: Nadara Mustard, MD;  Location: [facility] OR;  Service: Orthopedics;  Laterality: Right;   TOTAL KNEE ARTHROPLASTY Left 12/07/2017   TOTAL KNEE ARTHROPLASTY Left 12/07/2017   Procedure: LEFT TOTAL KNEE ARTHROPLASTY;  Surgeon: Nadara Mustard, MD;  Location: [facility] OR;  Service: Orthopedics;  Laterality: Left;   TRACHEOSTOMY TUBE PLACEMENT N/A 09/20/2017   Procedure: AWAKE INTUBATION WITH ANESTHESIA WITH VIDEO ASSISTANCE;  Surgeon: Graylin Shiver, MD;  Location[facility];  Service: ENT;  Laterality: N/A;   TRANSTHORACIC ECHOCARDIOGRAM  08/2014   Normal LV size and function.  Mild LVH.  EF 55-60%.  Normal regional wall motion.  GR  1 DD.  Normal RV size and function .   TRANSTHORACIC ECHOCARDIOGRAM  08/12/2020    [facility]): Normal LV size and function.  EF 55 to 60%.  No RWM A.  Normal filling pattern.  Normal RV.  No significant valvular disease-stenosis or regurgitation.  No vegetation.   TUBAL LIGATION     with reversal in 1994   UPPER GASTROINTESTINAL ENDOSCOPY     VENTRAL HERNIA REPAIR      Family History family history includes Asthma in her mother; Breast cancer in her mother; COPD in her mother; Coronary artery disease in her father; Diabetes in her brother, father, and mother; Emphysema in her mother; Heart attack in her maternal grandfather; Heart attack (age of onset: 23) in her father; Heart disease in her father; Heart failure in her father and mother; Hypertension in her father; Kidney disease in her mother; Lung cancer in her brother; Obesity in her father; Sarcoidosis in  her maternal uncle; Sleep apnea in her father; Throat cancer in her father; Thyroid disease in her mother.  Social History Social History   Socioeconomic History   Marital status: Married    Spouse name: TALEESHA ARENDALL   Number of children: 2   Years of education: 12   Highest education level: 12th grade  Occupational History   Occupation: DISABLED  Tobacco Use   Smoking status: Never   Smokeless tobacco: Never  Vaping Use   Vaping status: Never Used  Substance and Sexual Activity   Alcohol use: No    Alcohol/week: 0.0 standard drinks of alcohol   Drug use: No   Sexual activity: Yes    Birth control/protection: Surgical  Other Topics Concern   Not on file  Social History Narrative   Married 1994. 2 sons who both live close and 1 grandson.       Disability due to sarcoidosis. 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 [employer]   Social Determinants of Health   Financial  Resource Strain: Low Risk  (07/03/2023)   Overall Financial Resource Strain (CARDIA)    Difficulty of Paying Living Expenses: Not hard at all  Food Insecurity: No Food Insecurity (07/03/2023)   Hunger Vital Sign    Worried About Running Out of Food in the Last Year: Never true    Ran Out of Food in the Last Year: Never true  Transportation Needs: No Transportation Needs (07/03/2023)   PRAPARE - Administrator, Civil Service (Medical): No    Lack of Transportation (Non-Medical): No  Physical Activity: Insufficiently Active (07/03/2023)   Exercise Vital Sign    Days of Exercise per Week: 3 days    Minutes of Exercise per Session: 30 min  Stress: No Stress Concern Present (07/03/2023)   [employer] of Occupational Health - Occupational Stress Questionnaire    Feeling of Stress : Not at all  Social Connections: Socially Integrated (07/03/2023)   Social Connection and Isolation Panel [NHANES]    Frequency of Communication with Friends and Family: More than three times a week    Frequency of Social Gatherings with Friends and Family: More than three times a week    Attends Religious Services: More than 4 times per year    Active Member of Clubs or Organizations: Yes    Attends Banker Meetings: More than 4 times per year    Marital Status: Married  Catering manager Violence: Not At Risk (03/15/2023)   Humiliation, Afraid, Rape, and Kick questionnaire    Fear of Current or Ex-Partner: No    Emotionally Abused: No    Physically Abused: No    Sexually Abused: No    Lab Results  Component Value Date   HGBA1C 7.0 (A) 07/27/2023   HGBA1C 6.9 (A) 03/21/2023   HGBA1C 8.2 (H) 12/13/2022   Lab Results  Component Value Date   CHOL 137 10/12/2022   Lab Results  Component Value Date   HDL 50.60 10/12/2022   Lab Results  Component Value Date   LDLCALC 56 10/12/2022   Lab Results  Component Value Date   TRIG 154.0 (H) 10/12/2022   Lab Results  Component Value  Date   CHOLHDL 3 10/12/2022   Lab Results  Component Value Date   CREATININE 1.30 (H) 06/16/2023   Lab Results  Component Value  Date   GFR 50.57 (L) 06/15/2023   Lab Results  Component Value Date   MICROALBUR 2.2 (H) 02/25/2023      Component Value Date/Time   NA 136 06/15/2023 1453   NA 139 10/20/2020 0000   NA 135 (L) 04/17/2014 1039   K 4.1 06/15/2023 1453   K 4.5 04/17/2014 1039   CL 101 06/15/2023 1453   CO2 25 06/15/2023 1453   CO2 26 04/17/2014 1039   GLUCOSE 147 (H) 06/15/2023 1453   GLUCOSE 300 (H) 04/17/2014 1039   GLUCOSE 150 (H) 09/14/2006 1033   BUN 19 06/15/2023 1453   BUN 10 10/20/2020 0000   BUN 13.9 04/17/2014 1039   CREATININE 1.30 (H) 06/16/2023 1047   CREATININE 0.88 08/21/2020 1404   CREATININE 0.8 04/17/2014 1039   CALCIUM 9.6 06/15/2023 1453   CALCIUM 10.0 04/17/2014 1039   PROT 7.0 06/15/2023 1453   PROT 6.7 03/31/2020 1316   PROT 6.8 04/17/2014 1039   ALBUMIN 4.4 06/15/2023 1453   ALBUMIN 4.6 03/31/2020 1316   ALBUMIN 3.7 04/17/2014 1039   AST 22 06/15/2023 1453   AST 8 04/17/2014 1039   ALT 19 06/15/2023 1453   ALT 20 04/17/2014 1039   ALKPHOS 67 06/15/2023 1453   ALKPHOS 82 04/17/2014 1039   BILITOT 0.6 06/15/2023 1453   BILITOT 0.3 03/31/2020 1316   BILITOT 0.45 04/17/2014 1039   GFRNONAA 56 (L) 11/11/2022 0144   GFRNONAA 71 08/21/2020 1404   GFRAA 96 10/20/2020 0000   GFRAA 82 08/21/2020 1404      Latest Ref Rng & Units 06/16/2023   10:47 AM 06/15/2023    2:53 PM 02/10/2023   11:41 AM  BMP  Glucose 70 - 99 mg/dL  027  253   BUN 6 - 23 mg/dL  19  16   Creatinine 6.64 - 1.00 mg/dL 4.03  4.74  2.59   Sodium 135 - 145 mEq/L  136  139   Potassium 3.5 - 5.1 mEq/L  4.1  4.9   Chloride 96 - 112 mEq/L  101  99   CO2 19 - 32 mEq/L  25  30   Calcium 8.4 - 10.5 mg/dL  9.6  9.5        Component Value Date/Time   WBC 10.4 07/04/2023 1533   RBC 3.71 (L) 07/04/2023 1533   HGB 11.0 (L) 07/04/2023 1533   HGB 12.1 04/17/2014 1039   HCT  33.8 (L) 07/04/2023 1533   HCT 36.9 04/17/2014 1039   PLT 365.0 07/04/2023 1533   PLT 436 (H) 04/17/2014 1039   MCV 91.0 07/04/2023 1533   MCV 87.9 04/17/2014 1039   MCH 29.7 11/10/2022 0409   MCHC 32.4 07/04/2023 1533   RDW 14.0 07/04/2023 1533   RDW 14.5 04/17/2014 1039   LYMPHSABS 2.7 07/04/2023 1533   LYMPHSABS 2.2 04/17/2014 1039   MONOABS 0.7 07/04/2023 1533   MONOABS 0.8 04/17/2014 1039   EOSABS 0.4 07/04/2023 1533   EOSABS 0.1 04/17/2014 1039   BASOSABS 0.0 07/04/2023 1533   BASOSABS 0.0 04/17/2014 1039     Parts of this note may have been dictated using voice recognition software. There may be variances in spelling and vocabulary which are unintentional. Not all errors are proofread. Please notify the Thereasa Parkin if any discrepancies are noted or if the meaning of any statement is not clear.

## 2023-08-03 ENCOUNTER — Ambulatory Visit (INDEPENDENT_AMBULATORY_CARE_PROVIDER_SITE_OTHER): Payer: Medicare Other | Admitting: Family Medicine

## 2023-08-03 VITALS — BP 133/65 | HR 80 | Temp 97.5°F | Ht 66.0 in | Wt 217.2 lb

## 2023-08-03 DIAGNOSIS — R059 Cough, unspecified: Secondary | ICD-10-CM

## 2023-08-03 DIAGNOSIS — J441 Chronic obstructive pulmonary disease with (acute) exacerbation: Secondary | ICD-10-CM

## 2023-08-03 DIAGNOSIS — D860 Sarcoidosis of lung: Secondary | ICD-10-CM

## 2023-08-03 LAB — POC COVID19 BINAXNOW: SARS Coronavirus 2 Ag: NEGATIVE

## 2023-08-03 LAB — POCT RAPID STREP A (OFFICE): Rapid Strep A Screen: POSITIVE — AB

## 2023-08-03 LAB — POCT INFLUENZA A/B
Influenza A, POC: NEGATIVE
Influenza B, POC: NEGATIVE

## 2023-08-03 MED ORDER — HYDROCOD POLI-CHLORPHE POLI ER 10-8 MG/5ML PO SUER: 5.0000 mL | Freq: Every evening | ORAL | 0 refills | Status: DC | PRN

## 2023-08-03 MED ORDER — AMOXICILLIN 500 MG PO CAPS: 500.0000 mg | ORAL_CAPSULE | Freq: Two times a day (BID) | ORAL | 0 refills | Status: AC

## 2023-08-03 MED ORDER — ALBUTEROL SULFATE HFA 108 (90 BASE) MCG/ACT IN AERS: 2.0000 | INHALATION_SPRAY | Freq: Four times a day (QID) | RESPIRATORY_TRACT | 0 refills | Status: DC | PRN

## 2023-08-03 NOTE — Patient Instructions (Addendum)
It was very nice to see you today!  You have strep throat.  Start the amoxicillin.  I will refill your cough syrup and albuterol.  Make sure that she get plenty of fluids.  Let us know if not improving.  Return if symptoms worsen or fail to improve.   Take care, Dr Jimmey Ralph  PLEASE NOTE:  If you had any lab tests, please let us know if you have not heard back within a few days. You may see your results on mychart before we have a chance to review them but we will give you a call once they are reviewed by [place].   If we ordered any referrals today, please let us know if you have not heard from their office within the next week.   If you had any urgent prescriptions sent in today, please check with the pharmacy within an hour of our visit to make sure the prescription was transmitted appropriately.   Please try these tips to maintain a healthy lifestyle:  Eat at least 3 REAL meals and 1-2 snacks per day.  Aim for no more than 5 hours between eating.  If you eat breakfast, please do so within one hour of getting up.   Each meal should contain half fruits/vegetables, one quarter protein, and one quarter carbs (no bigger than a computer mouse)  Cut down on sweet beverages. This includes juice, soda, and sweet tea.   Drink at least 1 glass of water with each meal and aim for at least 8 glasses per day  Exercise at least 150 minutes every week.

## 2023-08-03 NOTE — Progress Notes (Signed)
   [name] is a 62 y.o. female who presents today for an office visit.  Assessment/Plan:  New/Acute Problems: Sore Throat Rapid strep positive. Covid and flu negative.  Will start amoxicillin.  Encouraged hydration.  She can use over-the-counter meds as needed as well.  Chronic Problems Addressed Today: Sarcoidosis / COPD Mild flare today are likely related to her URI and strep throat as above.  No signs of respiratory distress.  No signs of systemic illness.  Will refill her Tussidex and albuterol.  She has done well with these previously.  She will let us know if not improving.  We discussed reasons to return to care and seek emergent care.     Subjective:  HPI:  See Assessment / plan for status of chronic conditions.  Patient is here today with cough and sore throat. Symptoms started a couple of days ago. She did have a close contact that had a COVID a few days ago. No fevers or chills. Cough is non productive. No chest pain. Some shortness of breath.       Objective:  Physical Exam: BP 133/65   Pulse 80   Temp (!) 97.5 F (36.4 C) (Temporal)   Ht 5\' 6"  (1.676 m)   Wt 217 lb 3.2 oz (98.5 kg)   SpO2 96%   BMI 35.06 kg/m   Gen: No acute distress, resting comfortably HEENT: OP erythematous.  TMs clear. CV: Regular rate and rhythm with no murmurs appreciated Pulm: Normal work of breathing, Occasional and expiratory wheeze at bilateral bases.  Coarse rhonchi noted throughout. Neuro: Grossly normal, moves all extremities Psych: Normal affect and thought content      Zeniah Briney M. Jimmey Ralph, MD 08/03/2023 9:55 AM

## 2023-08-08 ENCOUNTER — Other Ambulatory Visit: Payer: Self-pay | Admitting: "Endocrinology

## 2023-08-08 DIAGNOSIS — E1165 Type 2 diabetes mellitus with hyperglycemia: Secondary | ICD-10-CM

## 2023-08-09 ENCOUNTER — Encounter (HOSPITAL_COMMUNITY): Payer: Self-pay

## 2023-08-09 ENCOUNTER — Encounter: Payer: Self-pay | Admitting: Physician Assistant

## 2023-08-09 ENCOUNTER — Emergency Department (HOSPITAL_COMMUNITY): Payer: Medicare Other

## 2023-08-09 ENCOUNTER — Other Ambulatory Visit: Payer: Self-pay

## 2023-08-09 ENCOUNTER — Ambulatory Visit (INDEPENDENT_AMBULATORY_CARE_PROVIDER_SITE_OTHER): Payer: Medicare Other | Admitting: Physician Assistant

## 2023-08-09 ENCOUNTER — Inpatient Hospital Stay (HOSPITAL_COMMUNITY)
Admission: EM | Admit: 2023-08-09 | Discharge: 2023-08-11 | DRG: 191 | Disposition: A | Discharge status: Home / Self Care | Payer: Medicare Other | Source: Ambulatory Visit | Attending: Student | Admitting: Student

## 2023-08-09 VITALS — BP 126/70 | HR 81 | Temp 97.3°F | Ht 66.0 in | Wt 214.4 lb

## 2023-08-09 DIAGNOSIS — E1122 Type 2 diabetes mellitus with diabetic chronic kidney disease: Secondary | ICD-10-CM | POA: Diagnosis present

## 2023-08-09 DIAGNOSIS — Z833 Family history of diabetes mellitus: Secondary | ICD-10-CM

## 2023-08-09 DIAGNOSIS — Z7951 Long term (current) use of inhaled steroids: Secondary | ICD-10-CM

## 2023-08-09 DIAGNOSIS — E785 Hyperlipidemia, unspecified: Secondary | ICD-10-CM | POA: Diagnosis not present

## 2023-08-09 DIAGNOSIS — G4730 Sleep apnea, unspecified: Secondary | ICD-10-CM | POA: Diagnosis not present

## 2023-08-09 DIAGNOSIS — Z96653 Presence of artificial knee joint, bilateral: Secondary | ICD-10-CM | POA: Diagnosis not present

## 2023-08-09 DIAGNOSIS — E1129 Type 2 diabetes mellitus with other diabetic kidney complication: Secondary | ICD-10-CM | POA: Diagnosis present

## 2023-08-09 DIAGNOSIS — Z1152 Encounter for screening for COVID-19: Secondary | ICD-10-CM | POA: Diagnosis not present

## 2023-08-09 DIAGNOSIS — M109 Gout, unspecified: Secondary | ICD-10-CM | POA: Diagnosis present

## 2023-08-09 DIAGNOSIS — Z6834 Body mass index (BMI) 34.0-34.9, adult: Secondary | ICD-10-CM

## 2023-08-09 DIAGNOSIS — Z79899 Other long term (current) drug therapy: Secondary | ICD-10-CM | POA: Diagnosis not present

## 2023-08-09 DIAGNOSIS — I10 Essential (primary) hypertension: Secondary | ICD-10-CM | POA: Diagnosis present

## 2023-08-09 DIAGNOSIS — Z801 Family history of malignant neoplasm of trachea, bronchus and lung: Secondary | ICD-10-CM

## 2023-08-09 DIAGNOSIS — J4551 Severe persistent asthma with (acute) exacerbation: Secondary | ICD-10-CM | POA: Diagnosis present

## 2023-08-09 DIAGNOSIS — Z8349 Family history of other endocrine, nutritional and metabolic diseases: Secondary | ICD-10-CM

## 2023-08-09 DIAGNOSIS — N183 Chronic kidney disease, stage 3 unspecified: Secondary | ICD-10-CM | POA: Diagnosis present

## 2023-08-09 DIAGNOSIS — K219 Gastro-esophageal reflux disease without esophagitis: Secondary | ICD-10-CM | POA: Diagnosis not present

## 2023-08-09 DIAGNOSIS — J441 Chronic obstructive pulmonary disease with (acute) exacerbation: Principal | ICD-10-CM | POA: Diagnosis present

## 2023-08-09 DIAGNOSIS — J4550 Severe persistent asthma, uncomplicated: Secondary | ICD-10-CM | POA: Diagnosis present

## 2023-08-09 DIAGNOSIS — Z794 Long term (current) use of insulin: Secondary | ICD-10-CM

## 2023-08-09 DIAGNOSIS — E66811 Obesity, class 1: Secondary | ICD-10-CM | POA: Diagnosis not present

## 2023-08-09 DIAGNOSIS — I130 Hypertensive heart and chronic kidney disease with heart failure and stage 1 through stage 4 chronic kidney disease, or unspecified chronic kidney disease: Secondary | ICD-10-CM | POA: Diagnosis not present

## 2023-08-09 DIAGNOSIS — N182 Chronic kidney disease, stage 2 (mild): Secondary | ICD-10-CM | POA: Diagnosis not present

## 2023-08-09 DIAGNOSIS — E1140 Type 2 diabetes mellitus with diabetic neuropathy, unspecified: Secondary | ICD-10-CM | POA: Diagnosis not present

## 2023-08-09 DIAGNOSIS — R918 Other nonspecific abnormal finding of lung field: Secondary | ICD-10-CM | POA: Diagnosis not present

## 2023-08-09 DIAGNOSIS — Z7984 Long term (current) use of oral hypoglycemic drugs: Secondary | ICD-10-CM

## 2023-08-09 DIAGNOSIS — F411 Generalized anxiety disorder: Secondary | ICD-10-CM | POA: Diagnosis not present

## 2023-08-09 DIAGNOSIS — D860 Sarcoidosis of lung: Secondary | ICD-10-CM | POA: Diagnosis present

## 2023-08-09 DIAGNOSIS — B9789 Other viral agents as the cause of diseases classified elsewhere: Secondary | ICD-10-CM | POA: Diagnosis not present

## 2023-08-09 DIAGNOSIS — I5032 Chronic diastolic (congestive) heart failure: Secondary | ICD-10-CM | POA: Diagnosis not present

## 2023-08-09 DIAGNOSIS — Z7982 Long term (current) use of aspirin: Secondary | ICD-10-CM

## 2023-08-09 DIAGNOSIS — Z808 Family history of malignant neoplasm of other organs or systems: Secondary | ICD-10-CM

## 2023-08-09 DIAGNOSIS — Z825 Family history of asthma and other chronic lower respiratory diseases: Secondary | ICD-10-CM

## 2023-08-09 DIAGNOSIS — Z8249 Family history of ischemic heart disease and other diseases of the circulatory system: Secondary | ICD-10-CM | POA: Diagnosis not present

## 2023-08-09 DIAGNOSIS — Z881 Allergy status to other antibiotic agents status: Secondary | ICD-10-CM

## 2023-08-09 DIAGNOSIS — R0989 Other specified symptoms and signs involving the circulatory and respiratory systems: Secondary | ICD-10-CM | POA: Diagnosis not present

## 2023-08-09 DIAGNOSIS — I517 Cardiomegaly: Secondary | ICD-10-CM | POA: Diagnosis not present

## 2023-08-09 DIAGNOSIS — R0602 Shortness of breath: Secondary | ICD-10-CM

## 2023-08-09 DIAGNOSIS — N1832 Chronic kidney disease, stage 3b: Secondary | ICD-10-CM | POA: Diagnosis present

## 2023-08-09 DIAGNOSIS — Z888 Allergy status to other drugs, medicaments and biological substances status: Secondary | ICD-10-CM | POA: Diagnosis not present

## 2023-08-09 DIAGNOSIS — Z803 Family history of malignant neoplasm of breast: Secondary | ICD-10-CM

## 2023-08-09 DIAGNOSIS — Z9049 Acquired absence of other specified parts of digestive tract: Secondary | ICD-10-CM

## 2023-08-09 DIAGNOSIS — G8929 Other chronic pain: Secondary | ICD-10-CM | POA: Diagnosis not present

## 2023-08-09 DIAGNOSIS — Z841 Family history of disorders of kidney and ureter: Secondary | ICD-10-CM

## 2023-08-09 DIAGNOSIS — B348 Other viral infections of unspecified site: Secondary | ICD-10-CM | POA: Diagnosis not present

## 2023-08-09 LAB — CBC WITH DIFFERENTIAL/PLATELET
Abs Immature Granulocytes: 0.22 10*3/uL — ABNORMAL HIGH (ref 0.00–0.07)
Basophils Absolute: 0.1 10*3/uL (ref 0.0–0.1)
Basophils Relative: 1 %
Eosinophils Absolute: 0.6 10*3/uL — ABNORMAL HIGH (ref 0.0–0.5)
Eosinophils Relative: 5 %
HCT: 34.3 % — ABNORMAL LOW (ref 36.0–46.0)
Hemoglobin: 11 g/dL — ABNORMAL LOW (ref 12.0–15.0)
Immature Granulocytes: 2 %
Lymphocytes Relative: 26 %
Lymphs Abs: 3.1 10*3/uL (ref 0.7–4.0)
MCH: 29.6 pg (ref 26.0–34.0)
MCHC: 32.1 g/dL (ref 30.0–36.0)
MCV: 92.5 fL (ref 80.0–100.0)
Monocytes Absolute: 0.7 10*3/uL (ref 0.1–1.0)
Monocytes Relative: 6 %
Neutro Abs: 7.2 10*3/uL (ref 1.7–7.7)
Neutrophils Relative %: 60 %
Platelets: 348 10*3/uL (ref 150–400)
RBC: 3.71 MIL/uL — ABNORMAL LOW (ref 3.87–5.11)
RDW: 12.8 % (ref 11.5–15.5)
WBC: 11.8 10*3/uL — ABNORMAL HIGH (ref 4.0–10.5)
nRBC: 0 % (ref 0.0–0.2)

## 2023-08-09 LAB — BASIC METABOLIC PANEL
Anion gap: 11 (ref 5–15)
BUN: 23 mg/dL (ref 8–23)
CO2: 25 mmol/L (ref 22–32)
Calcium: 9.2 mg/dL (ref 8.9–10.3)
Chloride: 101 mmol/L (ref 98–111)
Creatinine, Ser: 1.06 mg/dL — ABNORMAL HIGH (ref 0.44–1.00)
GFR, Estimated: 59 mL/min — ABNORMAL LOW (ref >60)
Glucose, Bld: 153 mg/dL — ABNORMAL HIGH (ref 70–99)
Potassium: 4.1 mmol/L (ref 3.5–5.1)
Sodium: 137 mmol/L (ref 135–145)

## 2023-08-09 LAB — HIV ANTIBODY (ROUTINE TESTING W REFLEX): HIV Screen 4th Generation wRfx: NONREACTIVE

## 2023-08-09 LAB — RESP PANEL BY RT-PCR (RSV, FLU A&B, COVID)  RVPGX2
Influenza A by PCR: NEGATIVE
Influenza B by PCR: NEGATIVE
Resp Syncytial Virus by PCR: NEGATIVE
SARS Coronavirus 2 by RT PCR: NEGATIVE

## 2023-08-09 LAB — GLUCOSE, CAPILLARY
Glucose-Capillary: 264 mg/dL — ABNORMAL HIGH (ref 70–99)
Glucose-Capillary: 283 mg/dL — ABNORMAL HIGH (ref 70–99)

## 2023-08-09 MED ORDER — ATORVASTATIN CALCIUM 40 MG PO TABS
40.0000 mg | ORAL_TABLET | Freq: Every day | ORAL | Status: DC
  Administered 2023-08-09 – 2023-08-11 (×3): 40 mg via ORAL
  Filled 2023-08-09 (×3): qty 1

## 2023-08-09 MED ORDER — PANTOPRAZOLE SODIUM 40 MG PO TBEC
40.0000 mg | DELAYED_RELEASE_TABLET | Freq: Two times a day (BID) | ORAL | Status: DC
  Administered 2023-08-09 – 2023-08-11 (×4): 40 mg via ORAL
  Filled 2023-08-09 (×4): qty 1

## 2023-08-09 MED ORDER — IRBESARTAN 150 MG PO TABS
150.0000 mg | ORAL_TABLET | Freq: Every day | ORAL | Status: DC
  Administered 2023-08-09 – 2023-08-11 (×3): 150 mg via ORAL
  Filled 2023-08-09 (×3): qty 1

## 2023-08-09 MED ORDER — CYCLOBENZAPRINE HCL 10 MG PO TABS: 5.0000 mg | ORAL_TABLET | Freq: Three times a day (TID) | ORAL | Status: DC | PRN

## 2023-08-09 MED ORDER — ALBUTEROL SULFATE (2.5 MG/3ML) 0.083% IN NEBU: 5.0000 mg | INHALATION_SOLUTION | Freq: Once | RESPIRATORY_TRACT | Status: DC

## 2023-08-09 MED ORDER — INSULIN ASPART 100 UNIT/ML IJ SOLN
0.0000 [IU] | Freq: Every day | INTRAMUSCULAR | Status: DC
Start: 2023-08-09 — End: 2023-08-10
  Administered 2023-08-09: 3 [IU] via SUBCUTANEOUS
  Filled 2023-08-09: qty 0.05

## 2023-08-09 MED ORDER — TRAZODONE HCL 50 MG PO TABS: 25.0000 mg | ORAL_TABLET | Freq: Every evening | ORAL | Status: DC | PRN

## 2023-08-09 MED ORDER — DILTIAZEM HCL ER COATED BEADS 120 MG PO CP24
120.0000 mg | ORAL_CAPSULE | Freq: Every day | ORAL | Status: DC
  Administered 2023-08-09 – 2023-08-11 (×3): 120 mg via ORAL
  Filled 2023-08-09 (×3): qty 1

## 2023-08-09 MED ORDER — INSULIN ASPART 100 UNIT/ML IJ SOLN
5.0000 [IU] | Freq: Three times a day (TID) | INTRAMUSCULAR | Status: DC
  Administered 2023-08-09 – 2023-08-10 (×3): 5 [IU] via SUBCUTANEOUS

## 2023-08-09 MED ORDER — ENOXAPARIN SODIUM 40 MG/0.4ML IJ SOSY
40.0000 mg | PREFILLED_SYRINGE | INTRAMUSCULAR | Status: DC
  Administered 2023-08-09 – 2023-08-10 (×2): 40 mg via SUBCUTANEOUS
  Filled 2023-08-09 (×2): qty 0.4

## 2023-08-09 MED ORDER — METHYLPREDNISOLONE SODIUM SUCC 40 MG IJ SOLR
40.0000 mg | Freq: Two times a day (BID) | INTRAMUSCULAR | Status: DC
  Administered 2023-08-09 – 2023-08-11 (×4): 40 mg via INTRAVENOUS
  Filled 2023-08-09 (×4): qty 1

## 2023-08-09 MED ORDER — ONDANSETRON HCL 4 MG PO TABS: 4.0000 mg | ORAL_TABLET | Freq: Four times a day (QID) | ORAL | Status: DC | PRN

## 2023-08-09 MED ORDER — ALBUTEROL (5 MG/ML) CONTINUOUS INHALATION SOLN: 10.0000 mg/h | INHALATION_SOLUTION | Freq: Once | RESPIRATORY_TRACT | Status: DC

## 2023-08-09 MED ORDER — ACETAMINOPHEN 325 MG PO TABS
650.0000 mg | ORAL_TABLET | Freq: Four times a day (QID) | ORAL | Status: DC | PRN
  Administered 2023-08-09 – 2023-08-10 (×2): 650 mg via ORAL
  Filled 2023-08-09 (×2): qty 2

## 2023-08-09 MED ORDER — IPRATROPIUM-ALBUTEROL 0.5-2.5 (3) MG/3ML IN SOLN
3.0000 mL | Freq: Once | RESPIRATORY_TRACT | Status: AC
  Administered 2023-08-09: 3 mL via RESPIRATORY_TRACT
  Filled 2023-08-09: qty 3

## 2023-08-09 MED ORDER — DEXAMETHASONE SODIUM PHOSPHATE 10 MG/ML IJ SOLN
10.0000 mg | Freq: Once | INTRAMUSCULAR | Status: AC
  Administered 2023-08-09: 10 mg via INTRAVENOUS
  Filled 2023-08-09: qty 1

## 2023-08-09 MED ORDER — ALBUTEROL SULFATE (2.5 MG/3ML) 0.083% IN NEBU
INHALATION_SOLUTION | RESPIRATORY_TRACT | Status: AC
  Filled 2023-08-09: qty 12

## 2023-08-09 MED ORDER — FLUTICASONE PROPIONATE 50 MCG/ACT NA SUSP
1.0000 | Freq: Every day | NASAL | Status: DC
  Administered 2023-08-10 – 2023-08-11 (×2): 1 via NASAL
  Filled 2023-08-09: qty 16

## 2023-08-09 MED ORDER — ALBUTEROL SULFATE (2.5 MG/3ML) 0.083% IN NEBU: 10.0000 mg/h | INHALATION_SOLUTION | Freq: Once | RESPIRATORY_TRACT | Status: DC

## 2023-08-09 MED ORDER — FENOFIBRATE 160 MG PO TABS
160.0000 mg | ORAL_TABLET | Freq: Every day | ORAL | Status: DC
  Administered 2023-08-10 – 2023-08-11 (×2): 160 mg via ORAL
  Filled 2023-08-09 (×2): qty 1

## 2023-08-09 MED ORDER — FUROSEMIDE 40 MG PO TABS
40.0000 mg | ORAL_TABLET | Freq: Every day | ORAL | Status: DC
  Administered 2023-08-10 – 2023-08-11 (×2): 40 mg via ORAL
  Filled 2023-08-09 (×2): qty 1

## 2023-08-09 MED ORDER — ALBUTEROL SULFATE (2.5 MG/3ML) 0.083% IN NEBU: 2.5000 mg | INHALATION_SOLUTION | RESPIRATORY_TRACT | Status: DC | PRN

## 2023-08-09 MED ORDER — SODIUM CHLORIDE 0.9 % IV SOLN
500.0000 mg | Freq: Once | INTRAVENOUS | Status: AC
  Administered 2023-08-09: 500 mg via INTRAVENOUS
  Filled 2023-08-09: qty 5

## 2023-08-09 MED ORDER — IPRATROPIUM-ALBUTEROL 0.5-2.5 (3) MG/3ML IN SOLN
3.0000 mL | Freq: Four times a day (QID) | RESPIRATORY_TRACT | Status: DC
  Administered 2023-08-09 – 2023-08-11 (×8): 3 mL via RESPIRATORY_TRACT
  Filled 2023-08-09 (×8): qty 3

## 2023-08-09 MED ORDER — POTASSIUM CHLORIDE CRYS ER 20 MEQ PO TBCR
20.0000 meq | EXTENDED_RELEASE_TABLET | Freq: Every day | ORAL | Status: DC
  Administered 2023-08-10 – 2023-08-11 (×2): 20 meq via ORAL
  Filled 2023-08-09 (×2): qty 1

## 2023-08-09 MED ORDER — ONDANSETRON HCL 4 MG/2ML IJ SOLN: 4.0000 mg | Freq: Four times a day (QID) | INTRAMUSCULAR | Status: DC | PRN

## 2023-08-09 MED ORDER — INSULIN GLARGINE-YFGN 100 UNIT/ML ~~LOC~~ SOLN
22.0000 [IU] | Freq: Every day | SUBCUTANEOUS | Status: DC
  Administered 2023-08-10 – 2023-08-11 (×2): 22 [IU] via SUBCUTANEOUS
  Filled 2023-08-09 (×2): qty 0.22

## 2023-08-09 MED ORDER — INSULIN ASPART 100 UNIT/ML IJ SOLN
0.0000 [IU] | Freq: Three times a day (TID) | INTRAMUSCULAR | Status: DC
  Administered 2023-08-09: 5 [IU] via SUBCUTANEOUS
  Administered 2023-08-10: 8 [IU] via SUBCUTANEOUS
  Administered 2023-08-10: 5 [IU] via SUBCUTANEOUS
  Filled 2023-08-09: qty 0.15

## 2023-08-09 MED ORDER — IPRATROPIUM BROMIDE 0.06 % NA SOLN
1.0000 | Freq: Four times a day (QID) | NASAL | Status: DC
Start: 2023-08-09 — End: 2023-08-11
  Administered 2023-08-09 – 2023-08-11 (×6): 1 via NASAL
  Filled 2023-08-09: qty 15

## 2023-08-09 MED ORDER — SODIUM CHLORIDE 0.9 % IV SOLN
1.0000 g | INTRAVENOUS | Status: DC
  Administered 2023-08-10: 1 g via INTRAVENOUS
  Filled 2023-08-09: qty 10

## 2023-08-09 MED ORDER — ACETAMINOPHEN 650 MG RE SUPP: 650.0000 mg | Freq: Four times a day (QID) | RECTAL | Status: DC | PRN

## 2023-08-09 MED ORDER — ALBUTEROL SULFATE (2.5 MG/3ML) 0.083% IN NEBU
10.0000 mg | INHALATION_SOLUTION | Freq: Once | RESPIRATORY_TRACT | Status: AC
  Administered 2023-08-09: 10 mg via RESPIRATORY_TRACT
  Filled 2023-08-09: qty 12

## 2023-08-09 MED ORDER — GABAPENTIN 300 MG PO CAPS
300.0000 mg | ORAL_CAPSULE | Freq: Every day | ORAL | Status: DC
  Administered 2023-08-09 – 2023-08-10 (×2): 300 mg via ORAL
  Filled 2023-08-09 (×2): qty 1

## 2023-08-09 MED ORDER — SODIUM CHLORIDE 0.9 % IV SOLN: 500.0000 mg | INTRAVENOUS | Status: DC

## 2023-08-09 MED ORDER — SODIUM CHLORIDE 0.9 % IV SOLN
1.0000 g | Freq: Once | INTRAVENOUS | Status: AC
Start: 2023-08-09 — End: 2023-08-09
  Administered 2023-08-09: 1 g via INTRAVENOUS
  Filled 2023-08-09: qty 10

## 2023-08-09 NOTE — ED Triage Notes (Signed)
Pt presenting with a cough, and audible wheezing that has been progressively becoming worse of the last several days. Pt seen at PCP, referred to the ER for further evaluation. Pt has been taking amoxicillin for about 1 wk, breathing treatment for several days, and an inhaler for several days without improvement.

## 2023-08-09 NOTE — ED Provider Notes (Signed)
[place] EMERGENCY DEPARTMENT AT [facility] Provider Note   CSN: [id number] Arrival date & time: 08/09/23  4403     History  Chief Complaint  Patient presents with   Shortness of Breath    [name] is a 62 y.o. female.  Patient is a 62 year old female with a history of COPD and sarcoidosis as well as diabetes, hyperlipidemia and hypertension who presents with shortness of breath.  History is obtained by the patient and her husband who helps provide history.  She has been having some worsening shortness of breath over the last week but its gotten markedly worse over the last 2 days.  She has a dry cough.  She has some pain in the center of her chest from the coughing but no other associated chest pain.  Her cough is nonproductive.  No noticeable fevers.  No leg pain or swelling.  She has been using nebulizer machines and an inhaler at home without improvement in symptoms.  She is not on baseline home oxygen.  She is followed by [facility] pulmonology.  She was seen about a week ago after she was having a cough and wheezing.  She had tested positive at that time for strep throat and was started on amoxicillin which she is currently still taking.  Her COVID/flu test was negative at that time.  She was seen in her primary care office this morning with worsening symptoms and was sent here for further evaluation.       Home Medications Prior to Admission medications   Medication Sig Start Date End Date Taking? Authorizing Provider  albuterol (PROVENTIL) (2.5 MG/3ML) 0.083% nebulizer solution Take 2.5 mg by nebulization every 6 (six) hours as needed for wheezing or shortness of breath.    [provider]  albuterol (VENTOLIN HFA) 108 (90 Base) MCG/ACT inhaler Inhale 2 puffs into the lungs every 6 (six) hours as needed for wheezing or shortness of breath. 08/03/23   Ardith Dark, MD  allopurinol (ZYLOPRIM) 300 MG tablet Take 1 tablet (300 mg total) by mouth daily.  09/08/22   Cristie Hem, PA-C  amoxicillin (AMOXIL) 500 MG capsule Take 1 capsule (500 mg total) by mouth 2 (two) times daily for 10 days. 08/03/23 08/13/23  Ardith Dark, MD  aspirin EC 81 MG tablet Take 1 tablet (81 mg total) by mouth daily. 11/03/20   Marykay Lex, MD  atorvastatin (LIPITOR) 40 MG tablet TAKE 1 TABLET BY MOUTH EVERY DAY Patient taking differently: Take 40 mg by mouth daily. 09/09/22   Shelva Majestic, MD  benzonatate (TESSALON PERLES) 100 MG capsule Take 1-2 capsules (100-200 mg total) by mouth 3 (three) times daily as needed for cough. 11/04/22   Hudnell, Judeth Cornfield, NP  BREO ELLIPTA 200-25 MCG/INH AEPB TAKE 1 PUFF BY MOUTH EVERY DAY Patient taking differently: Inhale 1 puff into the lungs daily. 06/04/19   Shelva Majestic, MD  buPROPion (WELLBUTRIN XL) 300 MG 24 hr tablet TAKE 1 TABLET BY MOUTH EVERY DAY 01/04/23   Shelva Majestic, MD  chlorpheniramine-HYDROcodone (TUSSIONEX) 10-8 MG/5ML Take 5 mLs by mouth at bedtime as needed for cough (do not drive for 8 hours after taking). 08/03/23   Ardith Dark, MD  Continuous Glucose Sensor (DEXCOM G7 SENSOR) MISC 1 Device by Does not apply route as directed. Change sensor every 10 days 07/18/23   Altamese Dierks, MD  cyclobenzaprine (FLEXERIL) 5 MG tablet Take 1 tablet (5 mg total) by mouth 3 (three)  times daily as needed for muscle spasms (or Headache). 11/04/22   Dulce Sellar, NP  diltiazem (CARDIZEM CD) 120 MG 24 hr capsule Take 1 capsule (120 mg total) by mouth daily. 02/17/23   Marjie Skiff E, PA-C  Dulaglutide (TRULICITY) 4.5 MG/0.5ML SOPN Inject 4.5 mg as directed once a week. 05/11/23   Motwani, Carin Hock, MD  estradiol (ESTRACE) 0.1 MG/GM vaginal cream Place 0.5 Applicatorfuls vaginally 2 (two) times a week.    [provider]  fenofibrate 160 MG tablet TAKE 1 TABLET BY MOUTH EVERY DAY 02/07/23   Marykay Lex, MD  fluticasone [facility]) 50 MCG/ACT nasal spray Place 2 sprays into both nostrils daily as  needed for allergies. Patient taking differently: Place 1 spray into both nostrils daily. 01/22/20   Shelva Majestic, MD  furosemide (LASIX) 40 MG tablet Take 1 tablet (40 mg total) by mouth daily. 06/12/22   Lonia Blood, MD  gabapentin (NEURONTIN) 300 MG capsule TAKE AT NIGHT IN ADDITION TO THE 100MG  GABAPENTIN TO GET 400MG  AT NIGHT AND 100MG  IN THE MORNING AND AT MID DAY. Patient taking differently: Take 300 mg by mouth at bedtime. 05/11/23   Rodolph Bong, MD  glucose blood (CONTOUR NEXT TEST) test strip 1 each by Other route 4 (four) times daily. And lancets 4/day 09/19/19   Romero Belling, MD  ibuprofen (ADVIL) 800 MG tablet Take 1 tablet (800 mg total) by mouth 3 (three) times daily. 12/30/22   Felecia Shelling, DPM  insulin aspart (NOVOLOG FLEXPEN) 100 UNIT/ML FlexPen Inject 15 Units into the skin daily with supper. 05/13/23   Motwani, Carin Hock, MD  insulin glargine (LANTUS SOLOSTAR) 100 UNIT/ML Solostar Pen Inject 16 Units into the skin every morning. Patient taking differently: Inject 22 Units into the skin daily. 07/30/21   Romero Belling, MD  Insulin Pen Needle (PEN NEEDLES) 32G X 4 MM MISC 1 each by Does not apply route 4 (four) times daily. E11.9 02/22/20   Romero Belling, MD  ipratropium (ATROVENT) 0.06 % nasal spray PLACE 2 SPRAYS INTO BOTH NOSTRILS 4 TIMES DAILY. 05/11/23   Shelva Majestic, MD  KLOR-CON M20 20 MEQ tablet TAKE 1 & 1/2 TABLETS BY MOUTH TWICE A DAY 01/04/23   Shelva Majestic, MD  Lancet Devices (EASY MINI EJECT LANCING DEVICE) MISC Use as directed 4 times daily to test blood sugar 11/04/20   Romero Belling, MD  LINZESS 290 MCG CAPS capsule TAKE 1 CAPSULE BY MOUTH DAILY BEFORE BREAKFAST. Patient taking differently: as needed. 09/07/21   Hilarie Fredrickson, MD  metFORMIN (GLUCOPHAGE-XR) 500 MG 24 hr tablet TAKE 4 TABLETS BY MOUTH DAILY WITH BREAKFAST. 08/08/23   Altamese Havana, MD  Microlet Lancets MISC 1 each by Does not apply route 2 (two) times daily. E11.9 09/19/19   Romero Belling,  MD  ondansetron (ZOFRAN-ODT) 4 MG disintegrating tablet Take 1 tablet (4 mg total) by mouth every 8 (eight) hours as needed for nausea or vomiting. 11/19/22   Shelva Majestic, MD  oxyCODONE-acetaminophen (PERCOCET) 5-325 MG tablet Take 1 tablet by mouth every 4 (four) hours as needed for severe pain. 12/30/22   Felecia Shelling, DPM  pantoprazole (PROTONIX) 40 MG tablet Take 1 tablet (40 mg total) by mouth 2 (two) times daily. 09/07/22   Hilarie Fredrickson, MD  telmisartan (MICARDIS) 40 MG tablet Take 1 tablet (40 mg total) by mouth daily. 02/17/23   Marjie Skiff E, PA-C  venlafaxine XR (EFFEXOR-XR) 75 MG 24 hr capsule TAKE 1  CAPSULE BY MOUTH TWICE A DAY 01/31/23   Shelva Majestic, MD  mupirocin nasal ointment (BACTROBAN) 2 % Place 1 application into the nose 2 (two) times daily. Use one-half of tube in each nostril twice daily for five (5) days. After application, press sides of nose together and gently massage. 03/28/18 03/28/18  Shelva Majestic, MD      Allergies    Firvanq [vancomycin], Trexall [methotrexate], Zestril [lisinopril], Chlorhexidine, Cleocin [clindamycin], Lincocin [lincomycin hcl], and Teflaro [ceftaroline]    Review of Systems   Review of Systems  Constitutional:  Negative for chills, diaphoresis, fatigue and fever.  HENT:  Negative for congestion, rhinorrhea and sneezing.   Eyes: Negative.   Respiratory:  Positive for cough, shortness of breath and wheezing. Negative for chest tightness.   Cardiovascular:  Positive for chest pain. Negative for leg swelling.  Gastrointestinal:  Negative for abdominal pain, blood in stool, diarrhea, nausea and vomiting.  Genitourinary:  Negative for difficulty urinating, flank pain, frequency and hematuria.  Musculoskeletal:  Negative for arthralgias and back pain.  Skin:  Negative for rash.  Neurological:  Negative for dizziness, speech difficulty, weakness, numbness and headaches.    Physical Exam Updated Vital Signs BP (!) 153/73   Pulse  (!) 102   Temp 97.9 F (36.6 C) (Oral)   Resp (!) 26   Ht 5\' 6"  (1.676 m)   Wt 97.2 kg   SpO2 96%   BMI 34.59 kg/m  Physical Exam Constitutional:      Appearance: She is well-developed.  HENT:     Head: Normocephalic and atraumatic.  Eyes:     Pupils: Pupils are equal, round, and reactive to light.  Cardiovascular:     Rate and Rhythm: Normal rate and regular rhythm.     Heart sounds: Normal heart sounds.  Pulmonary:     Effort: Tachypnea and accessory muscle usage present. No respiratory distress.     Breath sounds: Decreased breath sounds and wheezing present. No rales.  Chest:     Chest wall: No tenderness.  Abdominal:     General: Bowel sounds are normal.     Palpations: Abdomen is soft.     Tenderness: There is no abdominal tenderness. There is no guarding or rebound.  Musculoskeletal:        General: Normal range of motion.     Cervical back: Normal range of motion and neck supple.     Comments: No edema or calf tenderness  Lymphadenopathy:     Cervical: No cervical adenopathy.  Skin:    General: Skin is warm and dry.     Findings: No rash.  Neurological:     Mental Status: She is alert and oriented to person, place, and time.     ED Results / Procedures / Treatments   Labs (all labs ordered are listed, but only abnormal results are displayed) Labs Reviewed  BASIC METABOLIC PANEL - Abnormal; Notable for the following components:      Result Value   Glucose, Bld 153 (*)    Creatinine, Ser 1.06 (*)    GFR, Estimated 59 (*)    All other components within normal limits  CBC WITH DIFFERENTIAL/PLATELET - Abnormal; Notable for the following components:   WBC 11.8 (*)    RBC 3.71 (*)    Hemoglobin 11.0 (*)    HCT 34.3 (*)    Eosinophils Absolute 0.6 (*)    Abs Immature Granulocytes 0.22 (*)    All other components within normal limits  RESP PANEL BY RT-PCR (RSV, FLU A&B, COVID)  [id number]    EKG EKG Interpretation Date/Time:  Tuesday August 09 2023  08:52:21 EDT Ventricular Rate:  89 PR Interval:  149 QRS Duration:  106 QT Interval:  359 QTC Calculation: 437 R Axis:   40  Text Interpretation: Sinus rhythm Minimal ST elevation, inferior leads since last tracing no significant change Confirmed by Rolan Bucco [phone]) on 08/09/2023 9:40:17 AM  Radiology DG Chest Port 1 View  Result Date: 08/09/2023 CLINICAL DATA:  Shortness of breath EXAM: PORTABLE CHEST 1 VIEW COMPARISON:  Chest radiograph dated 11/09/2022 FINDINGS: Low lung volumes with bronchovascular crowding. Hazy bilateral lower lung opacities. No pleural effusion or pneumothorax. Enlarged cardiomediastinal silhouette. No acute osseous abnormality. Surgical clips project over the gastroesophageal junction. IMPRESSION: 1. Low lung volumes with bronchovascular crowding. Hazy bilateral lower lung opacities, which may represent atelectasis, aspiration, or pneumonia. 2. Cardiomegaly. Electronically Signed   By: Agustin Cree M.D.   On: 08/09/2023 09:42    Procedures Procedures    Medications Ordered in ED Medications  cefTRIAXone (ROCEPHIN) 1 g in sodium chloride 0.9 % 100 mL IVPB (1 g Intravenous New Bag/Given 08/09/23 1157)  azithromycin (ZITHROMAX) 500 mg in sodium chloride 0.9 % 250 mL IVPB (has no administration in time range)  ipratropium-albuterol (DUONEB) 0.5-2.5 (3) MG/3ML nebulizer solution 3 mL (3 mLs Nebulization Given 08/09/23 0928)  dexamethasone (DECADRON) injection 10 mg (10 mg Intravenous Given 08/09/23 0942)  albuterol (PROVENTIL) (2.5 MG/3ML) 0.083% nebulizer solution 10 mg (10 mg Nebulization Given 08/09/23 1054)    ED Course/ Medical Decision Making/ A&P                                 Medical Decision Making Amount and/or Complexity of Data Reviewed Labs: ordered. Radiology: ordered.  Risk Prescription drug management. Decision regarding hospitalization.   Patient is a 62 year old female who presents with wheezing and shortness of breath.  She was  given Decadron and started on nebulizer treatments.  She was given DuoNeb followed by continuous hour-long neb.  She did have some ongoing improvement following this.  On repeat exams, she still has wheezing and some tachypnea but her work of breathing has improved.  Chest x-ray was performed which was interpreted by me and confirmed by the radiologist to show some bibasilar social markings.  She was started on IV Rocephin and Zithromax.  Her COVID/flu test is negative.  Other labs are reviewed and are nonconcerning.  Her EKG does not show any ischemic changes.  She does not have other symptoms that would be more concerning for ACS.  She does not have other symptoms that would be more concerning for PE.  I did speak with Dr. Erenest Blank who will admit the patient for further treatment.  CRITICAL CARE Performed by: Rolan Bucco Total critical care time: 60 minutes Critical care time was exclusive of separately billable procedures and treating other patients. Critical care was necessary to treat or prevent imminent or life-threatening deterioration. Critical care was time spent personally by me on the following activities: development of treatment plan with patient and/or surrogate as well as nursing, discussions with consultants, evaluation of patient's response to treatment, examination of patient, obtaining history from patient or surrogate, ordering and performing treatments and interventions, ordering and review of laboratory studies, ordering and review of radiographic studies, pulse oximetry and re-evaluation of patient's condition.   Final Clinical Impression(s) / ED Diagnoses Final  diagnoses:  COPD exacerbation [facility])    Rx / DC Orders ED Discharge Orders     None         Rolan Bucco, MD 08/09/23 1224

## 2023-08-09 NOTE — H&P (Signed)
History and Physical  [name] WNU:[id number] DOB: 11-02-1960 DOA: 08/09/2023  PCP: Shelva Majestic, MD   Chief Complaint: Cough, shortness of breath, wheezing  HPI: [name] is a 62 y.o. female with medical history significant for hypertension, asthma, pulmonary sarcoidosis, chronic bronchitis, chronic heart failure with preserved EF, GERD, insulin-dependent type 2 diabetes being admitted to the hospital for exacerbation of COPD.  Patient is typically on room air at baseline, she was diagnosed via her PCP with strep throat about 1 week ago, has been on amoxicillin and says that her sore throat symptoms have improved.  However, since that time she has had continued cough, with some clear sputum production, denies any fevers, chills, chest pain.  In the last 48 hours, she has started having some wheezing.  She was given an inhaler by her PCP, this has not been helping.  Decided to come to the ER for evaluation.  Here in the emergency department, chest x-ray is unremarkable, she has borderline leukocytosis, stable CKD.  Chest x-ray as noted below without evidence of obvious consolidation.  She was given IV Solu-Medrol, breathing treatment, empiric IV antibiotics and hospitalist admission was requested.  Review of Systems: Please see HPI for pertinent positives and negatives. A complete 10 system review of systems are otherwise negative.  Past Medical History:  Diagnosis Date   Abnormal SPEP 04/17/2014   Acute left ankle pain 01/26/2017   ANEMIA-UNSPECIFIED 09/18/2009   Anxiety    Arthritis    Blood transfusion without reported diagnosis    Cataract    CHF (congestive heart failure) (HCC)    Chronic diastolic heart failure, NYHA class 2 (HCC)    Normal LVEDP by May 2018   COPD (chronic obstructive pulmonary disease) (HCC)    Depression    DIABETES MELLITUS, TYPE II 08/21/2006   Diabetic osteomyelitis (HCC) 05/29/2015   Fatty liver    Fracture of 5th metatarsal    non union    GERD 08/21/2006   Had fundoplication   GOUT 08/20/2010   Hammer toe of right foot    2-5th toes   Hx of umbilical hernia repair    HYPERLIPIDEMIA 08/21/2006   HYPERTENSION 08/21/2006   Infection of wound due to methicillin resistant Staphylococcus aureus (MRSA)    Internal hemorrhoids    Kidney problem    Multiple allergies 10/14/2016   OBESITY 06/04/2009   Onychomycosis 10/27/2015   Osteomyelitis of left foot (HCC) 05/29/2015   Osteoporosis    Pulmonary sarcoidosis (HCC)    Followed locally by pulmonology, but also by Dr. Sandy Salaam at [facility]   Right knee pain 01/26/2017   Vocal cord dysfunction    Wears partial dentures    Past Surgical History:  Procedure Laterality Date   ABDOMINAL HYSTERECTOMY     APPENDECTOMY     BLADDER SUSPENSION  11/11/2011   Procedure: TRANSVAGINAL TAPE (TVT) PROCEDURE;  Surgeon: Levi Aland, MD;  Location: [facility];  Service: Gynecology;  Laterality: N/A;   CAROTIDS  02/18/2011   CAROTID DUPLEX; VERTEBRALS ARE PATENT WITH ANTEGRADE FLOW. ICA/CCA RATIO 1.61 ON RIGHT AND 0.75 ON LEFT   CATARACT EXTRACTION, BILATERAL     CHOLECYSTECTOMY  1984   COLONOSCOPY  04/29/2010   Marina Goodell   CYSTOSCOPY  11/11/2011   Procedure: CYSTOSCOPY;  Surgeon: Levi Aland, MD;  Location: [facility];  Service: Gynecology;  Laterality: N/A;   EXTUBATION (ENDOTRACHEAL) IN OR N/A 09/23/2017   Procedure: EXTUBATION (ENDOTRACHEAL) IN OR;  Surgeon: Billy Fischer  J, MD;  Location: [facility] OR;  Service: ENT;  Laterality: N/A;   FIBEROPTIC LARYNGOSCOPY AND TRACHEOSCOPY N/A 09/23/2017   Procedure: FLEXIBLE FIBEROPTIC LARYNGOSCOPY;  Surgeon: Graylin Shiver, MD;  Location: [facility] OR;  Service: ENT;  Laterality: N/A;   FRACTURE SURGERY     foot   HALLUX FUSION Left 10/02/2018   Procedure: HALLUX ARTHRODESIS;  Surgeon: Felecia Shelling, DPM;  Location: [facility];  Service: Podiatry;  Laterality: Left;   HAMMER TOE SURGERY Left 10/02/2018   Procedure: HAMMER TOE CORRECTION 2ND  3RD 4RD FIFTH TOE;  Surgeon: Felecia Shelling, DPM;  Location: [facility];  Service: Podiatry;  Laterality: Left;   HAMMER TOE SURGERY Right 04/12/2019   Procedure: HAMMER TOE CORRECTION, 2ND, 3RD, 4TH AND 5TH TOES OF RIGHT FOOT;  Surgeon: Felecia Shelling, DPM;  Location: [facility];  Service: Podiatry;  Laterality: Right;   HAMMER TOE SURGERY Left 10/25/2019   Procedure: HAMMER TOE REPAIR SECOND, THIRD, FOUTH;  Surgeon: Felecia Shelling, DPM;  Location: [facility];  Service: Podiatry;  Laterality: Left;   HERNIA REPAIR     I & D EXTREMITY Left 06/27/2015   Procedure: Partial Excision Left Calcaneus, Place Antibiotic Beads, and Wound VAC;  Surgeon: Nadara Mustard, MD;  Location: [facility];  Service: Orthopedics;  Laterality: Left;   JOINT REPLACEMENT     KNEE ARTHROSCOPY     right   LEFT AND RIGHT HEART CATHETERIZATION WITH CORONARY ANGIOGRAM N/A 04/23/2013   Procedure: LEFT AND RIGHT HEART CATHETERIZATION WITH CORONARY ANGIOGRAM;  Surgeon: Marykay Lex, MD;  Location: [facility] CATH LAB;  Service: Cardiovascular;  Laterality: N/A;   LEFT HEART CATH AND CORONARY ANGIOGRAPHY N/A 03/11/2017   Procedure: Left Heart Cath and Coronary Angiography;  Surgeon: Tonny Bollman, MD;  Location: [facility] INVASIVE CV LAB; angiographically minimal CAD in the LAD otherwise normal.;  Normal LVEDP.  FALSE POSITIVE MYOVIEW   LEFT HEART CATH AND CORONARY ANGIOGRAPHY  07/20/2010   LVEF 50-55% WITH VERY MILD GLOBAL HYPOKINESIA; ESSENTIALLY NORMAL CORONARY ARTERIES; NORMAL LV FUNCTION   METATARSAL OSTEOTOMY WITH OPEN REDUCTION INTERNAL FIXATION (ORIF) METATARSAL WITH FUSION Left 04/09/2014   Procedure: LEFT FOOT FRACTURE OPEN TREATMENT METATARSAL INCLUDES INTERNAL FIXATION EACH;  Surgeon: Nilda Simmer, MD;  Location: [facility];  Service: Orthopedics;  Laterality: Left;   NISSEN FUNDOPLICATION  2004   NM MYOVIEW LTD --> FALSE POSITIVE  03/10/2017   : Moderate size "stress-induced "perfusion defect at the apex as well as "ill-defined  stress-induced perfusion defect in the lateral wall.  EF 55%.  INTERMEDIATE risk. -->  FALSE POSITIVE   ORIF DISTAL FEMUR FRACTURE  08/08/2020   [facility]: ORIF of left extra-articular periprosthetic distal femur fracture with synthesis of the femur plate with cortical nonlocking screws.  (Thought to be pathologic fracture due to osteoporosis) -> after a fall   ORIF FEMUR W/ PERI-IMPLANT  08/29/2020   [facility]) revision of left femur fracture ORIF plate fixation screws; culture positive for MSSA   Right and left CARDIAC CATHETERIZATION  04/23/2013   Angiographic normal coronaries; LVEDP 20 mmHg, PCWP 12-14 mmHg, RAP 12 mmHg.; Fick CO/CI 4.9/2.2   ROTATOR CUFF REPAIR Right 12/09/2021   SHOULDER ARTHROSCOPY Left 03/14/2019   Procedure: LEFT SHOULDER ARTHROSCOPY, DEBRIDEMENT, AND DECOMPRESSION;  Surgeon: Nadara Mustard, MD;  Location: [facility];  Service: Orthopedics;  Laterality: Left;   TOTAL KNEE ARTHROPLASTY Right 06/29/2017   Procedure: RIGHT TOTAL KNEE ARTHROPLASTY;  Surgeon: Lajoyce Corners,  Randa Evens, MD;  Location: [facility];  Service: Orthopedics;  Laterality: Right;   TOTAL KNEE ARTHROPLASTY Left 12/07/2017   TOTAL KNEE ARTHROPLASTY Left 12/07/2017   Procedure: LEFT TOTAL KNEE ARTHROPLASTY;  Surgeon: Nadara Mustard, MD;  Location: [facility] OR;  Service: Orthopedics;  Laterality: Left;   TRACHEOSTOMY TUBE PLACEMENT N/A 09/20/2017   Procedure: AWAKE INTUBATION WITH ANESTHESIA WITH VIDEO ASSISTANCE;  Surgeon: Graylin Shiver, MD;  Location: [facility];  Service: ENT;  Laterality: N/A;   TRANSTHORACIC ECHOCARDIOGRAM  08/2014   Normal LV size and function.  Mild LVH.  EF 55-60%.  Normal regional wall motion.  GR 1 DD.  Normal RV size and function .   TRANSTHORACIC ECHOCARDIOGRAM  08/12/2020    [facility]): Normal LV size and function.  EF 55 to 60%.  No RWM A.  Normal filling pattern.  Normal RV.  No significant valvular disease-stenosis or regurgitation.  No vegetation.   TUBAL  LIGATION     with reversal in 1994   UPPER GASTROINTESTINAL ENDOSCOPY     VENTRAL HERNIA REPAIR      Social History:  reports that she has never smoked. She has never used smokeless tobacco. She reports that she does not drink alcohol and does not use drugs.   Allergies  Allergen Reactions   Firvanq [Vancomycin] Other (See Comments)    Dose related nephrotoxicity    Trexall [Methotrexate] Other (See Comments)    Peri-oral and buccal lesions   Zestril [Lisinopril] Cough   Chlorhexidine Itching   Cleocin [Clindamycin] Nausea And Vomiting and Rash   Lincocin [Lincomycin Hcl] Nausea And Vomiting and Rash   Teflaro [Ceftaroline] Rash and Other (See Comments)    Tolerates ceftriaxone     Family History  Problem Relation Age of Onset   Diabetes Father    Heart attack Father 54   Coronary artery disease Father    Heart failure Father    Throat cancer Father    Hypertension Father    Heart disease Father    Sleep apnea Father    Obesity Father    COPD Mother    Emphysema Mother    Asthma Mother    Heart failure Mother    Breast cancer Mother    Diabetes Mother    Kidney disease Mother    Thyroid disease Mother    Heart attack Maternal Grandfather    Sarcoidosis Maternal Uncle    Lung cancer Brother    Diabetes Brother    Colon cancer Neg Hx    Colon polyps Neg Hx    Esophageal cancer Neg Hx    Rectal cancer Neg Hx    Stomach cancer Neg Hx      Prior to Admission medications   Medication Sig Start Date End Date Taking? Authorizing Provider  albuterol (PROVENTIL) (2.5 MG/3ML) 0.083% nebulizer solution Take 2.5 mg by nebulization every 6 (six) hours as needed for wheezing or shortness of breath.    [provider]  albuterol (VENTOLIN HFA) 108 (90 Base) MCG/ACT inhaler Inhale 2 puffs into the lungs every 6 (six) hours as needed for wheezing or shortness of breath. 08/03/23   Ardith Dark, MD  allopurinol (ZYLOPRIM) 300 MG tablet Take 1 tablet (300 mg total) by  mouth daily. 09/08/22   Cristie Hem, PA-C  amoxicillin (AMOXIL) 500 MG capsule Take 1 capsule (500 mg total) by mouth 2 (two) times daily for 10 days. 08/03/23 08/13/23  Ardith Dark, MD  aspirin  EC 81 MG tablet Take 1 tablet (81 mg total) by mouth daily. 11/03/20   Marykay Lex, MD  atorvastatin (LIPITOR) 40 MG tablet TAKE 1 TABLET BY MOUTH EVERY DAY Patient taking differently: Take 40 mg by mouth daily. 09/09/22   Shelva Majestic, MD  benzonatate (TESSALON PERLES) 100 MG capsule Take 1-2 capsules (100-200 mg total) by mouth 3 (three) times daily as needed for cough. 11/04/22   Hudnell, Judeth Cornfield, NP  BREO ELLIPTA 200-25 MCG/INH AEPB TAKE 1 PUFF BY MOUTH EVERY DAY Patient taking differently: Inhale 1 puff into the lungs daily. 06/04/19   Shelva Majestic, MD  buPROPion (WELLBUTRIN XL) 300 MG 24 hr tablet TAKE 1 TABLET BY MOUTH EVERY DAY 01/04/23   Shelva Majestic, MD  chlorpheniramine-HYDROcodone (TUSSIONEX) 10-8 MG/5ML Take 5 mLs by mouth at bedtime as needed for cough (do not drive for 8 hours after taking). 08/03/23   Ardith Dark, MD  Continuous Glucose Sensor (DEXCOM G7 SENSOR) MISC 1 Device by Does not apply route as directed. Change sensor every 10 days 07/18/23   Altamese Anderson, MD  cyclobenzaprine (FLEXERIL) 5 MG tablet Take 1 tablet (5 mg total) by mouth 3 (three) times daily as needed for muscle spasms (or Headache). 11/04/22   Dulce Sellar, NP  diltiazem (CARDIZEM CD) 120 MG 24 hr capsule Take 1 capsule (120 mg total) by mouth daily. 02/17/23   Marjie Skiff E, PA-C  Dulaglutide (TRULICITY) 4.5 MG/0.5ML SOPN Inject 4.5 mg as directed once a week. 05/11/23   Motwani, Carin Hock, MD  estradiol (ESTRACE) 0.1 MG/GM vaginal cream Place 0.5 Applicatorfuls vaginally 2 (two) times a week.    [provider]  fenofibrate 160 MG tablet TAKE 1 TABLET BY MOUTH EVERY DAY 02/07/23   Marykay Lex, MD  fluticasone [facility]) 50 MCG/ACT nasal spray Place 2 sprays into both  nostrils daily as needed for allergies. Patient taking differently: Place 1 spray into both nostrils daily. 01/22/20   Shelva Majestic, MD  furosemide (LASIX) 40 MG tablet Take 1 tablet (40 mg total) by mouth daily. 06/12/22   Lonia Blood, MD  gabapentin (NEURONTIN) 300 MG capsule TAKE AT NIGHT IN ADDITION TO THE 100MG  GABAPENTIN TO GET 400MG  AT NIGHT AND 100MG  IN THE MORNING AND AT MID DAY. Patient taking differently: Take 300 mg by mouth at bedtime. 05/11/23   Rodolph Bong, MD  glucose blood (CONTOUR NEXT TEST) test strip 1 each by Other route 4 (four) times daily. And lancets 4/day 09/19/19   Romero Belling, MD  ibuprofen (ADVIL) 800 MG tablet Take 1 tablet (800 mg total) by mouth 3 (three) times daily. 12/30/22   Felecia Shelling, DPM  insulin aspart (NOVOLOG FLEXPEN) 100 UNIT/ML FlexPen Inject 15 Units into the skin daily with supper. 05/13/23   Motwani, Carin Hock, MD  insulin glargine (LANTUS SOLOSTAR) 100 UNIT/ML Solostar Pen Inject 16 Units into the skin every morning. Patient taking differently: Inject 22 Units into the skin daily. 07/30/21   Romero Belling, MD  Insulin Pen Needle (PEN NEEDLES) 32G X 4 MM MISC 1 each by Does not apply route 4 (four) times daily. E11.9 02/22/20   Romero Belling, MD  ipratropium (ATROVENT) 0.06 % nasal spray PLACE 2 SPRAYS INTO BOTH NOSTRILS 4 TIMES DAILY. 05/11/23   Shelva Majestic, MD  KLOR-CON M20 20 MEQ tablet TAKE 1 & 1/2 TABLETS BY MOUTH TWICE A DAY 01/04/23   Shelva Majestic, MD  Lancet Devices (EASY MINI EJECT LANCING  DEVICE) MISC Use as directed 4 times daily to test blood sugar 11/04/20   Romero Belling, MD  LINZESS 290 MCG CAPS capsule TAKE 1 CAPSULE BY MOUTH DAILY BEFORE BREAKFAST. Patient taking differently: as needed. 09/07/21   Hilarie Fredrickson, MD  metFORMIN (GLUCOPHAGE-XR) 500 MG 24 hr tablet TAKE 4 TABLETS BY MOUTH DAILY WITH BREAKFAST. 08/08/23   Altamese Imperial, MD  Microlet Lancets MISC 1 each by Does not apply route 2 (two) times daily. E11.9  09/19/19   Romero Belling, MD  ondansetron (ZOFRAN-ODT) 4 MG disintegrating tablet Take 1 tablet (4 mg total) by mouth every 8 (eight) hours as needed for nausea or vomiting. 11/19/22   Shelva Majestic, MD  oxyCODONE-acetaminophen (PERCOCET) 5-325 MG tablet Take 1 tablet by mouth every 4 (four) hours as needed for severe pain. 12/30/22   Felecia Shelling, DPM  pantoprazole (PROTONIX) 40 MG tablet Take 1 tablet (40 mg total) by mouth 2 (two) times daily. 09/07/22   Hilarie Fredrickson, MD  telmisartan (MICARDIS) 40 MG tablet Take 1 tablet (40 mg total) by mouth daily. 02/17/23   Marjie Skiff E, PA-C  venlafaxine XR (EFFEXOR-XR) 75 MG 24 hr capsule TAKE 1 CAPSULE BY MOUTH TWICE A DAY 01/31/23   Shelva Majestic, MD  mupirocin nasal ointment (BACTROBAN) 2 % Place 1 application into the nose 2 (two) times daily. Use one-half of tube in each nostril twice daily for five (5) days. After application, press sides of nose together and gently massage. 03/28/18 03/28/18  Shelva Majestic, MD    Physical Exam: BP (!) 153/73   Pulse (!) 102   Temp 97.9 F (36.6 C) (Oral)   Resp (!) 26   Ht 5\' 6"  (1.676 m)   Wt 97.2 kg   SpO2 96%   BMI 34.59 kg/m   General:  Alert, oriented, calm, in no acute distress, resting comfortably on room air.  Her husband is at the bedside.  She is speaking in full sentences, very minimal dry sounding cough.  Audible wheezing from several feet away. Eyes: EOMI, clear conjuctivae, white sclerea Neck: supple, no masses, trachea mildline  Cardiovascular: RRR, no murmurs or rubs, no peripheral edema  Respiratory: Breath sounds are diminished globally, she has some slight tachypnea especially when talking.  No retractions, no stridor.  Diffuse expiratory wheezing.  No basilar crackles. Abdomen: soft, nontender, nondistended, normal bowel tones heard  Skin: dry, no rashes  Musculoskeletal: no joint effusions, normal range of motion  Psychiatric: appropriate affect, normal speech   Neurologic: extraocular muscles intact, clear speech, moving all extremities with intact sensorium         Labs on Admission:  Basic Metabolic Panel: Recent Labs  Lab 08/09/23 0941  NA 137  K 4.1  CL 101  CO2 25  GLUCOSE 153*  BUN 23  CREATININE 1.06*  CALCIUM 9.2   Liver Function Tests: No results for input(s): "AST", "ALT", "ALKPHOS", "BILITOT", "PROT", "ALBUMIN" in the last 168 hours. No results for input(s): "LIPASE", "AMYLASE" in the last 168 hours. No results for input(s): "AMMONIA" in the last 168 hours. CBC: Recent Labs  Lab 08/09/23 0941  WBC 11.8*  NEUTROABS 7.2  HGB 11.0*  HCT 34.3*  MCV 92.5  PLT 348   Cardiac Enzymes: No results for input(s): "CKTOTAL", "CKMB", "CKMBINDEX", "TROPONINI" in the last 168 hours.  BNP (last 3 results) Recent Labs    11/09/22 1850  BNP 21.0    ProBNP (last 3 results) No results for  input(s): "PROBNP" in the last 8760 hours.  CBG: No results for input(s): "GLUCAP" in the last 168 hours.  Radiological Exams on Admission: DG Chest Port 1 View  Result Date: 08/09/2023 CLINICAL DATA:  Shortness of breath EXAM: PORTABLE CHEST 1 VIEW COMPARISON:  Chest radiograph dated 11/09/2022 FINDINGS: Low lung volumes with bronchovascular crowding. Hazy bilateral lower lung opacities. No pleural effusion or pneumothorax. Enlarged cardiomediastinal silhouette. No acute osseous abnormality. Surgical clips project over the gastroesophageal junction. IMPRESSION: 1. Low lung volumes with bronchovascular crowding. Hazy bilateral lower lung opacities, which may represent atelectasis, aspiration, or pneumonia. 2. Cardiomegaly. Electronically Signed   By: Agustin Cree M.D.   On: 08/09/2023 09:42    Assessment/Plan [name] is a 62 y.o. female with medical history significant for hypertension, asthma, pulmonary sarcoidosis, chronic bronchitis, chronic heart failure with preserved EF, GERD, insulin-dependent type 2 diabetes being admitted to  the hospital for exacerbation of COPD.   Exacerbation of COPD-she does have chronic bronchitis, sarcoidosis as well as diagnosis of asthma.  Currently not hypoxic, afebrile. -Inpatient admission to MedSurg -Supplemental oxygen if needed, with goal O2 saturation greater than 90% -Received 125 mg IV Solu-Medrol in the ER, continue steroids 40 mg IV every 12 hours -Scheduled DuoNebs, as needed albuterol -Incentive spirometry and flutter valve -Empiric IV azithromycin and IV Rocephin, due to possible pulmonary consolidation  Insulin-dependent type 2 diabetes-decently well-controlled, last hemoglobin A1c 7.0 -Heart healthy carb controlled diet -Continue Semglee 22 units daily -Premeal NovoLog 5 units with meals (takes 10 units at home, however anticipate she will not be eating as much in the hospital) -Moderate dose sliding scale  Chronic heart failure with preserved EF-no evidence of acute exacerbation, patient appears euvolemic and well compensated on exam -Continue Lasix with potassium supplementation  Peripheral neuropathy-Neurontin  Hypertension-continue home diltiazem and telmisartan (therapeutic interchange to Avapro)  Hyperlipidemia-Lipitor and fenofibrate  GERD-Protonix  DVT prophylaxis: Lovenox     Code Status: Full Code  Consults called: None  Admission status: The appropriate patient status for this patient is INPATIENT. Inpatient status is judged to be reasonable and necessary in order to provide the required intensity of service to ensure the patient's safety. The patient's presenting symptoms, physical exam findings, and initial radiographic and laboratory data in the context of their chronic comorbidities is felt to place them at high risk for further clinical deterioration. Furthermore, it is not anticipated that the patient will be medically stable for discharge from the hospital within 2 midnights of admission.    I certify that at the point of admission it is my  clinical judgment that the patient will require inpatient hospital care spanning beyond 2 midnights from the point of admission due to high intensity of service, high risk for further deterioration and high frequency of surveillance required  Time spent: 59 minutes  Jasira Robinson Sharlette Dense MD [facility] Pager [phone]  If 7PM-7AM, please contact night-coverage [email or web address] Password [email or web address]  08/09/2023, 12:40 PM

## 2023-08-09 NOTE — Plan of Care (Signed)

## 2023-08-09 NOTE — ED Notes (Signed)
ED TO INPATIENT HANDOFF REPORT  Name/Age/Gender [name] 62 y.o. female  Code Status    Code Status Orders  (From admission, onward)           Start     Ordered   08/09/23 1224  Full code  Continuous       Question:  By:  Answer:  Consent: discussion documented in EHR   08/09/23 1224           Code Status History     Date Active Date Inactive Code Status Order ID Comments User Context   11/10/2022 0201 11/12/2022 1522 Full Code [id number]  Darlin Drop, DO Inpatient   06/06/2022 0402 06/09/2022 1904 Full Code [id number]  Briscoe Deutscher, MD ED   12/07/2017 1534 12/10/2017 1450 Full Code [id number]  Nadara Mustard, MD Inpatient   09/23/2017 1002 09/26/2017 1935 Full Code [id number]  Coralyn Helling, MD Inpatient   08/13/2017 0022 08/15/2017 2030 Full Code [id number]  Lorretta Harp, MD ED   06/29/2017 1550 07/01/2017 1603 Full Code [id number]  Nadara Mustard, MD Inpatient   03/09/2017 2011 03/12/2017 1447 Full Code [id number]  Jonah Blue, MD Inpatient   12/01/2015 0053 12/05/2015 1736 Full Code [id number]  Eston Esters, MD ED   10/14/2015 0013 10/18/2015 1653 Full Code [id number]  Briscoe Deutscher, MD ED   10/14/2015 0002 10/14/2015 0013 Full Code [id number]  Briscoe Deutscher, MD ED   06/27/2015 1625 06/30/2015 1538 Full Code [id number]  Nadara Mustard, MD Inpatient   04/27/2015 1551 04/29/2015 1605 Full Code [id number]  Albertine Grates, MD Inpatient   03/27/2015 1719 03/29/2015 2056 Full Code [id number]  Julien Girt, PA-C Inpatient   09/15/2014 1730 09/18/2014 1906 Full Code [id number]  Leatha Gilding, MD ED   03/14/2014 0331 03/16/2014 1652 Full Code [id number]  Lynden Oxford, MD Inpatient   12/03/2013 0053 12/05/2013 1839 Full Code [id number]  Kela Millin, MD ED   09/18/2013 0241 09/20/2013 1252 Full Code [id number]  Storm Frisk, MD Inpatient   04/12/2013 0100 04/12/2013 2026 Full Code [id number]  Alba Cory, MD Inpatient   02/11/2013 0908 02/13/2013 1619 Full Code [id number]  Penny Pia, MD Inpatient    12/16/2012 0352 12/18/2012 1623 Full Code [id number]  Houston Siren, MD ED   12/31/2011 0123 01/03/2012 1419 Full Code [id number]  Harvest Dark, RN Inpatient   12/30/2011 0732 12/31/2011 0123 Full Code [id number]  Dayton Bailiff, MD ED       Home/SNF/Other Home  Chief Complaint COPD with acute exacerbation (HCC) [J44.1]  Level of Care/Admitting Diagnosis ED Disposition     ED Disposition  Admit   Condition  --   Comment  Hospital Area: [facility] [[id number]]  Level of Care: Med-Surg [16]  May admit patient to [facility] or [facility] if equivalent level of care is available:: Yes  Covid Evaluation: Confirmed COVID Negative  Diagnosis: COPD with acute exacerbation [facility]) [867619]  Admitting Physician: Maryln Gottron [[id number]]  Attending Physician: [facility], MIR [email or web address] [[id number]]  Certification:: I certify this patient will need inpatient services for at least 2 midnights  Expected Medical Readiness: 08/12/2023          Medical History Past Medical History:  Diagnosis Date   Abnormal SPEP 04/17/2014   Acute left ankle pain 01/26/2017   ANEMIA-UNSPECIFIED 09/18/2009   Anxiety    Arthritis    Blood transfusion without reported diagnosis    Cataract    CHF (congestive heart failure) (  HCC)    Chronic diastolic heart failure, NYHA class 2 (HCC)    Normal LVEDP by May 2018   COPD (chronic obstructive pulmonary disease) (HCC)    Depression    DIABETES MELLITUS, TYPE II 08/21/2006   Diabetic osteomyelitis (HCC) 05/29/2015   Fatty liver    Fracture of 5th metatarsal    non union   GERD 08/21/2006   Had fundoplication   GOUT 08/20/2010   Hammer toe of right foot    2-5th toes   Hx of umbilical hernia repair    HYPERLIPIDEMIA 08/21/2006   HYPERTENSION 08/21/2006   Infection of wound due to methicillin resistant Staphylococcus aureus (MRSA)    Internal hemorrhoids    Kidney problem    Multiple allergies 10/14/2016   OBESITY 06/04/2009   Onychomycosis 10/27/2015    Osteomyelitis of left foot (HCC) 05/29/2015   Osteoporosis    Pulmonary sarcoidosis (HCC)    Followed locally by pulmonology, but also by Dr. Sandy Salaam at [facility]   Right knee pain 01/26/2017   Vocal cord dysfunction    Wears partial dentures     Allergies Allergies  Allergen Reactions   Firvanq [Vancomycin] Other (See Comments)    Dose related nephrotoxicity    Trexall [Methotrexate] Other (See Comments)    Peri-oral and buccal lesions   Zestril [Lisinopril] Cough   Chlorhexidine Itching   Cleocin [Clindamycin] Nausea And Vomiting and Rash   Lincocin [Lincomycin Hcl] Nausea And Vomiting and Rash   Teflaro [Ceftaroline] Rash and Other (See Comments)    Tolerates ceftriaxone     IV Location/Drains/Wounds Patient Lines/Drains/Airways Status     Active Line/Drains/Airways     Name Placement date Placement time Site Days   Peripheral IV 08/09/23 20 G 1" Anterior;Right Forearm 08/09/23  0940  Forearm  less than 1            Labs/Imaging Results for orders placed or performed during the hospital encounter of 08/09/23 (from the past 48 hour(s))  Resp panel by RT-PCR (RSV, Flu A&B, Covid) Anterior Nasal Swab     Status: None   Collection Time: 08/09/23  8:56 AM   Specimen: Anterior Nasal Swab  Result Value Ref Range   SARS Coronavirus 2 by RT PCR NEGATIVE NEGATIVE    Comment: (NOTE) SARS-CoV-2 target nucleic acids are NOT DETECTED.  The SARS-CoV-2 RNA is generally detectable in upper respiratory specimens during the acute phase of infection. The lowest concentration of SARS-CoV-2 viral copies this assay can detect is 138 copies/mL. A negative result does not preclude SARS-Cov-2 infection and should not be used as the sole basis for treatment or other patient management decisions. A negative result may occur with  improper specimen collection/handling, submission of specimen other than nasopharyngeal swab, presence of viral mutation(s) within the areas  targeted by this assay, and inadequate number of viral copies(<138 copies/mL). A negative result must be combined with clinical observations, patient history, and epidemiological information. The expected result is Negative.  Fact Sheet for Patients:  BloggerCourse.com  Fact Sheet for Healthcare Providers:  SeriousBroker.it  This test is no t yet approved or cleared by the [place] FDA and  has been authorized for detection and/or diagnosis of SARS-CoV-2 by FDA under an Emergency Use Authorization (EUA). This EUA will remain  in effect (meaning this test can be used) for the duration of the COVID-19 declaration under Section 564(b)(1) of the Act, 21 U.S.C.section 360bbb-3(b)(1), unless the authorization is terminated  or revoked sooner.  Influenza A by PCR NEGATIVE NEGATIVE   Influenza B by PCR NEGATIVE NEGATIVE    Comment: (NOTE) The Xpert Xpress SARS-CoV-2/FLU/RSV plus assay is intended as an aid in the diagnosis of influenza from Nasopharyngeal swab specimens and should not be used as a sole basis for treatment. Nasal washings and aspirates are unacceptable for Xpert Xpress SARS-CoV-2/FLU/RSV testing.  Fact Sheet for Patients: BloggerCourse.com  Fact Sheet for Healthcare Providers: SeriousBroker.it  This test is not yet approved or cleared by the [place] FDA and has been authorized for detection and/or diagnosis of SARS-CoV-2 by FDA under an Emergency Use Authorization (EUA). This EUA will remain in effect (meaning this test can be used) for the duration of the COVID-19 declaration under Section 564(b)(1) of the Act, 21 U.S.C. section 360bbb-3(b)(1), unless the authorization is terminated or revoked.     Resp Syncytial Virus by PCR NEGATIVE NEGATIVE    Comment: (NOTE) Fact Sheet for Patients: BloggerCourse.com  Fact Sheet for  Healthcare Providers: SeriousBroker.it  This test is not yet approved or cleared by the [place] FDA and has been authorized for detection and/or diagnosis of SARS-CoV-2 by FDA under an Emergency Use Authorization (EUA). This EUA will remain in effect (meaning this test can be used) for the duration of the COVID-19 declaration under Section 564(b)(1) of the Act, 21 U.S.C. section 360bbb-3(b)(1), unless the authorization is terminated or revoked.  Performed at [facility], [address] [place], [place] [address]   Basic metabolic panel     Status: Abnormal   Collection Time: 08/09/23  9:41 AM  Result Value Ref Range   Sodium 137 135 - 145 mmol/L   Potassium 4.1 3.5 - 5.1 mmol/L   Chloride 101 98 - 111 mmol/L   CO2 25 22 - 32 mmol/L   Glucose, Bld 153 (H) 70 - 99 mg/dL    Comment: Glucose reference range applies only to samples taken after fasting for at least 8 hours.   BUN 23 8 - 23 mg/dL   Creatinine, Ser 0.98 (H) 0.44 - 1.00 mg/dL   Calcium 9.2 8.9 - 11.9 mg/dL   GFR, Estimated 59 (L) >60 mL/min    Comment: (NOTE) Calculated using the CKD-EPI Creatinine Equation (2021)    Anion gap 11 5 - 15    Comment: Performed at [facility], [address], [place], [place] [address]  CBC with Differential/Platelet     Status: Abnormal   Collection Time: 08/09/23  9:41 AM  Result Value Ref Range   WBC 11.8 (H) 4.0 - 10.5 K/uL   RBC 3.71 (L) 3.87 - 5.11 MIL/uL   Hemoglobin 11.0 (L) 12.0 - 15.0 g/dL   HCT 95.6 (L) 21.3 - 08.6 %   MCV 92.5 80.0 - 100.0 fL   MCH 29.6 26.0 - 34.0 pg   MCHC 32.1 30.0 - 36.0 g/dL   RDW 57.8 46.9 - 62.9 %   Platelets 348 150 - 400 K/uL   nRBC 0.0 0.0 - 0.2 %   Neutrophils Relative % 60 %   Neutro Abs 7.2 1.7 - 7.7 K/uL   Lymphocytes Relative 26 %   Lymphs Abs 3.1 0.7 - 4.0 K/uL   Monocytes Relative 6 %   Monocytes Absolute 0.7 0.1 - 1.0 K/uL   Eosinophils Relative 5 %    Eosinophils Absolute 0.6 (H) 0.0 - 0.5 K/uL   Basophils Relative 1 %   Basophils Absolute 0.1 0.0 - 0.1 K/uL   Immature Granulocytes 2 %  Abs Immature Granulocytes 0.22 (H) 0.00 - 0.07 K/uL    Comment: Performed at [facility], [address] [place], [place] [address]   *Note: Due to a large number of results and/or encounters for the requested time period, some results have not been displayed. A complete set of results can be found in Results Review.   DG Chest Port 1 View  Result Date: 08/09/2023 CLINICAL DATA:  Shortness of breath EXAM: PORTABLE CHEST 1 VIEW COMPARISON:  Chest radiograph dated 11/09/2022 FINDINGS: Low lung volumes with bronchovascular crowding. Hazy bilateral lower lung opacities. No pleural effusion or pneumothorax. Enlarged cardiomediastinal silhouette. No acute osseous abnormality. Surgical clips project over the gastroesophageal junction. IMPRESSION: 1. Low lung volumes with bronchovascular crowding. Hazy bilateral lower lung opacities, which may represent atelectasis, aspiration, or pneumonia. 2. Cardiomegaly. Electronically Signed   By: Agustin Cree M.D.   On: 08/09/2023 09:42    Pending Labs Unresulted Labs (From admission, onward)     Start     Ordered   08/10/23 0500  Basic metabolic panel  Tomorrow morning,   R        08/09/23 1224   08/10/23 0500  CBC  Tomorrow morning,   R        08/09/23 1224   08/09/23 1224  HIV Antibody (routine testing w rflx)  (HIV Antibody (Routine testing w reflex) panel)  Once,   R        08/09/23 1224            Vitals/Pain Today's Vitals   08/09/23 0849 08/09/23 0955 08/09/23 1054 08/09/23 1151  BP: (!) 152/84   (!) 153/73  Pulse: 87   (!) 102  Resp: (!) 22   (!) 26  Temp: (!) 97.5 F (36.4 C)   97.9 F (36.6 C)  TempSrc: Oral   Oral  SpO2: 96%  100% 96%  Weight:  97.2 kg    Height:  5\' 6"  (1.676 m)    PainSc:        Isolation Precautions No active isolations  Medications Medications   azithromycin (ZITHROMAX) 500 mg in sodium chloride 0.9 % 250 mL IVPB (has no administration in time range)  atorvastatin (LIPITOR) tablet 40 mg (has no administration in time range)  diltiazem (CARDIZEM CD) 24 hr capsule 120 mg (has no administration in time range)  furosemide (LASIX) tablet 40 mg (has no administration in time range)  irbesartan (AVAPRO) tablet 150 mg (has no administration in time range)  insulin glargine-yfgn (SEMGLEE) injection 22 Units (has no administration in time range)  pantoprazole (PROTONIX) EC tablet 40 mg (has no administration in time range)  cyclobenzaprine (FLEXERIL) tablet 5 mg (has no administration in time range)  gabapentin (NEURONTIN) capsule 300 mg (has no administration in time range)  fluticasone (FLONASE) 50 MCG/ACT nasal spray 1 spray (has no administration in time range)  ipratropium (ATROVENT) 0.06 % nasal spray 1 spray (has no administration in time range)  azithromycin (ZITHROMAX) 500 mg in sodium chloride 0.9 % 250 mL IVPB (has no administration in time range)  cefTRIAXone (ROCEPHIN) 1 g in sodium chloride 0.9 % 100 mL IVPB (has no administration in time range)  ipratropium-albuterol (DUONEB) 0.5-2.5 (3) MG/3ML nebulizer solution 3 mL (has no administration in time range)  methylPREDNISolone sodium succinate (SOLU-MEDROL) 40 mg/mL injection 40 mg (has no administration in time range)  enoxaparin (LOVENOX) injection 40 mg (has no administration in time range)  insulin aspart (novoLOG) injection 0-15 Units (has no administration  in time range)  insulin aspart (novoLOG) injection 0-5 Units (has no administration in time range)  acetaminophen (TYLENOL) tablet 650 mg (has no administration in time range)    Or  acetaminophen (TYLENOL) suppository 650 mg (has no administration in time range)  traZODone (DESYREL) tablet 25 mg (has no administration in time range)  ondansetron (ZOFRAN) tablet 4 mg (has no administration in time range)    Or   ondansetron (ZOFRAN) injection 4 mg (has no administration in time range)  albuterol (PROVENTIL) (2.5 MG/3ML) 0.083% nebulizer solution 2.5 mg (has no administration in time range)  ipratropium-albuterol (DUONEB) 0.5-2.5 (3) MG/3ML nebulizer solution 3 mL (3 mLs Nebulization Given 08/09/23 0928)  dexamethasone (DECADRON) injection 10 mg (10 mg Intravenous Given 08/09/23 0942)  albuterol (PROVENTIL) (2.5 MG/3ML) 0.083% nebulizer solution 10 mg (10 mg Nebulization Given 08/09/23 1054)  cefTRIAXone (ROCEPHIN) 1 g in sodium chloride 0.9 % 100 mL IVPB (1 g Intravenous New Bag/Given 08/09/23 1157)    Mobility walks

## 2023-08-09 NOTE — Progress Notes (Signed)
[name] is a 62 y.o. female here for a follow up of a pre-existing problem.  History of Present Illness:   Chief Complaint  Patient presents with   Cough    Pt still having persistent cough, non-productive, wheezing, chest hurst when coughing. Denies fever or chills.    HPI  Cough / Wheezing:  Pt was last seen by Dr. Jimmey Ralph on 10/23 complaining of sore throat, cough, and some shortness of breath.  Rapid strep test was positive. Covid and flu was negative.  She was started on amoxicillin 500 mg twice daily for 10 days.   Today, she complains of a persistent cough and wheezing.  Wheezing started 48 hours ago.  Has been using amoxicillin as prescribed at her last visit as well as her breo and nebulizer.  She started using her nebulizer 2 nights ago and 3 times yesterday.  Was not taking albuterol inhaler prior to her last visit.  Reports that most times she gets a cough she ends up wheezing and usually gets hospitalized for this.   Past Medical History:  Diagnosis Date   Abnormal SPEP 04/17/2014   Acute left ankle pain 01/26/2017   ANEMIA-UNSPECIFIED 09/18/2009   Anxiety    Arthritis    Blood transfusion without reported diagnosis    Cataract    CHF (congestive heart failure) (HCC)    Chronic diastolic heart failure, NYHA class 2 (HCC)    Normal LVEDP by May 2018   COPD (chronic obstructive pulmonary disease) (HCC)    Depression    DIABETES MELLITUS, TYPE II 08/21/2006   Diabetic osteomyelitis (HCC) 05/29/2015   Fatty liver    Fracture of 5th metatarsal    non union   GERD 08/21/2006   Had fundoplication   GOUT 08/20/2010   Hammer toe of right foot    2-5th toes   Hx of umbilical hernia repair    HYPERLIPIDEMIA 08/21/2006   HYPERTENSION 08/21/2006   Infection of wound due to methicillin resistant Staphylococcus aureus (MRSA)    Internal hemorrhoids    Kidney problem    Multiple allergies 10/14/2016   OBESITY 06/04/2009   Onychomycosis 10/27/2015   Osteomyelitis  of left foot (HCC) 05/29/2015   Osteoporosis    Pulmonary sarcoidosis (HCC)    Followed locally by pulmonology, but also by Dr. Sandy Salaam at [facility]   Right knee pain 01/26/2017   Vocal cord dysfunction    Wears partial dentures      Social History   Tobacco Use   Smoking status: Never   Smokeless tobacco: Never  Vaping Use   Vaping status: Never Used  Substance Use Topics   Alcohol use: No    Alcohol/week: 0.0 standard drinks of alcohol   Drug use: No    Past Surgical History:  Procedure Laterality Date   ABDOMINAL HYSTERECTOMY     APPENDECTOMY     BLADDER SUSPENSION  11/11/2011   Procedure: TRANSVAGINAL TAPE (TVT) PROCEDURE;  Surgeon: Levi Aland, MD;  Location: [facility];  Service: Gynecology;  Laterality: N/A;   CAROTIDS  02/18/2011   CAROTID DUPLEX; VERTEBRALS ARE PATENT WITH ANTEGRADE FLOW. ICA/CCA RATIO 1.61 ON RIGHT AND 0.75 ON LEFT   CATARACT EXTRACTION, BILATERAL     CHOLECYSTECTOMY  1984   COLONOSCOPY  04/29/2010   Marina Goodell   CYSTOSCOPY  11/11/2011   Procedure: CYSTOSCOPY;  Surgeon: Levi Aland, MD;  Location: [facility];  Service: Gynecology;  Laterality: N/A;   EXTUBATION (ENDOTRACHEAL) IN OR N/A 09/23/2017  Procedure: EXTUBATION (ENDOTRACHEAL) IN OR;  Surgeon: Graylin Shiver, MD;  Location: [facility] OR;  Service: ENT;  Laterality: N/A;   FIBEROPTIC LARYNGOSCOPY AND TRACHEOSCOPY N/A 09/23/2017   Procedure: FLEXIBLE FIBEROPTIC LARYNGOSCOPY;  Surgeon: Graylin Shiver, MD;  Location: [facility] OR;  Service: ENT;  Laterality: N/A;   FRACTURE SURGERY     foot   HALLUX FUSION Left 10/02/2018   Procedure: HALLUX ARTHRODESIS;  Surgeon: Felecia Shelling, DPM;  Location: [facility];  Service: Podiatry;  Laterality: Left;   HAMMER TOE SURGERY Left 10/02/2018   Procedure: HAMMER TOE CORRECTION 2ND 3RD 4RD FIFTH TOE;  Surgeon: Felecia Shelling, DPM;  Location: [facility];  Service: Podiatry;  Laterality: Left;   HAMMER TOE SURGERY Right 04/12/2019   Procedure: HAMMER TOE  CORRECTION, 2ND, 3RD, 4TH AND 5TH TOES OF RIGHT FOOT;  Surgeon: Felecia Shelling, DPM;  Location: [facility] OR;  Service: Podiatry;  Laterality: Right;   HAMMER TOE SURGERY Left 10/25/2019   Procedure: HAMMER TOE REPAIR SECOND, THIRD, FOUTH;  Surgeon: Felecia Shelling, DPM;  Location: [facility] OR;  Service: Podiatry;  Laterality: Left;   HERNIA REPAIR     I & D EXTREMITY Left 06/27/2015   Procedure: Partial Excision Left Calcaneus, Place Antibiotic Beads, and Wound VAC;  Surgeon: Nadara Mustard, MD;  Location: [facility] OR;  Service: Orthopedics;  Laterality: Left;   JOINT REPLACEMENT     KNEE ARTHROSCOPY     right   LEFT AND RIGHT HEART CATHETERIZATION WITH CORONARY ANGIOGRAM N/A 04/23/2013   Procedure: LEFT AND RIGHT HEART CATHETERIZATION WITH CORONARY ANGIOGRAM;  Surgeon: Marykay Lex, MD;  Location: [facility] CATH LAB;  Service: Cardiovascular;  Laterality: N/A;   LEFT HEART CATH AND CORONARY ANGIOGRAPHY N/A 03/11/2017   Procedure: Left Heart Cath and Coronary Angiography;  Surgeon: Tonny Bollman, MD;  Location: [facility] INVASIVE CV LAB; angiographically minimal CAD in the LAD otherwise normal.;  Normal LVEDP.  FALSE POSITIVE MYOVIEW   LEFT HEART CATH AND CORONARY ANGIOGRAPHY  07/20/2010   LVEF 50-55% WITH VERY MILD GLOBAL HYPOKINESIA; ESSENTIALLY NORMAL CORONARY ARTERIES; NORMAL LV FUNCTION   METATARSAL OSTEOTOMY WITH OPEN REDUCTION INTERNAL FIXATION (ORIF) METATARSAL WITH FUSION Left 04/09/2014   Procedure: LEFT FOOT FRACTURE OPEN TREATMENT METATARSAL INCLUDES INTERNAL FIXATION EACH;  Surgeon: Nilda Simmer, MD;  Location: [facility];  Service: Orthopedics;  Laterality: Left;   NISSEN FUNDOPLICATION  2004   NM MYOVIEW LTD --> FALSE POSITIVE  03/10/2017   : Moderate size "stress-induced "perfusion defect at the apex as well as "ill-defined stress-induced perfusion defect in the lateral wall.  EF 55%.  INTERMEDIATE risk. -->  FALSE POSITIVE   ORIF DISTAL FEMUR FRACTURE  08/08/2020   [facility]:  ORIF of left extra-articular periprosthetic distal femur fracture with synthesis of the femur plate with cortical nonlocking screws.  (Thought to be pathologic fracture due to osteoporosis) -> after a fall   ORIF FEMUR W/ PERI-IMPLANT  08/29/2020   [facility]) revision of left femur fracture ORIF plate fixation screws; culture positive for MSSA   Right and left CARDIAC CATHETERIZATION  04/23/2013   Angiographic normal coronaries; LVEDP 20 mmHg, PCWP 12-14 mmHg, RAP 12 mmHg.; Fick CO/CI 4.9/2.2   ROTATOR CUFF REPAIR Right 12/09/2021   SHOULDER ARTHROSCOPY Left 03/14/2019   Procedure: LEFT SHOULDER ARTHROSCOPY, DEBRIDEMENT, AND DECOMPRESSION;  Surgeon: Nadara Mustard, MD;  Location: [facility];  Service: Orthopedics;  Laterality: Left;   TOTAL KNEE ARTHROPLASTY Right 06/29/2017  Procedure: RIGHT TOTAL KNEE ARTHROPLASTY;  Surgeon: Nadara Mustard, MD;  Location: [facility] OR;  Service: Orthopedics;  Laterality: Right;   TOTAL KNEE ARTHROPLASTY Left 12/07/2017   TOTAL KNEE ARTHROPLASTY Left 12/07/2017   Procedure: LEFT TOTAL KNEE ARTHROPLASTY;  Surgeon: Nadara Mustard, MD;  Location: [facility] OR;  Service: Orthopedics;  Laterality: Left;   TRACHEOSTOMY TUBE PLACEMENT N/A 09/20/2017   Procedure: AWAKE INTUBATION WITH ANESTHESIA WITH VIDEO ASSISTANCE;  Surgeon: Graylin Shiver, MD;  Location: [facility];  Service: ENT;  Laterality: N/A;   TRANSTHORACIC ECHOCARDIOGRAM  08/2014   Normal LV size and function.  Mild LVH.  EF 55-60%.  Normal regional wall motion.  GR 1 DD.  Normal RV size and function .   TRANSTHORACIC ECHOCARDIOGRAM  08/12/2020    [facility]): Normal LV size and function.  EF 55 to 60%.  No RWM A.  Normal filling pattern.  Normal RV.  No significant valvular disease-stenosis or regurgitation.  No vegetation.   TUBAL LIGATION     with reversal in 1994   UPPER GASTROINTESTINAL ENDOSCOPY     VENTRAL HERNIA REPAIR      Family History  Problem Relation Age of Onset   Diabetes  Father    Heart attack Father 78   Coronary artery disease Father    Heart failure Father    Throat cancer Father    Hypertension Father    Heart disease Father    Sleep apnea Father    Obesity Father    COPD Mother    Emphysema Mother    Asthma Mother    Heart failure Mother    Breast cancer Mother    Diabetes Mother    Kidney disease Mother    Thyroid disease Mother    Heart attack Maternal Grandfather    Sarcoidosis Maternal Uncle    Lung cancer Brother    Diabetes Brother    Colon cancer Neg Hx    Colon polyps Neg Hx    Esophageal cancer Neg Hx    Rectal cancer Neg Hx    Stomach cancer Neg Hx     Allergies  Allergen Reactions   Firvanq [Vancomycin] Other (See Comments)    Dose related nephrotoxicity    Trexall [Methotrexate] Other (See Comments)    Peri-oral and buccal lesions   Zestril [Lisinopril] Cough   Chlorhexidine Itching   Cleocin [Clindamycin] Nausea And Vomiting and Rash   Lincocin [Lincomycin Hcl] Nausea And Vomiting and Rash   Teflaro [Ceftaroline] Rash and Other (See Comments)    Tolerates ceftriaxone     Current Medications:   Current Outpatient Medications:    albuterol (PROVENTIL) (2.5 MG/3ML) 0.083% nebulizer solution, Take 2.5 mg by nebulization every 6 (six) hours as needed for wheezing or shortness of breath., Disp: , Rfl:    albuterol (VENTOLIN HFA) 108 (90 Base) MCG/ACT inhaler, Inhale 2 puffs into the lungs every 6 (six) hours as needed for wheezing or shortness of breath., Disp: 8 g, Rfl: 0   allopurinol (ZYLOPRIM) 300 MG tablet, Take 1 tablet (300 mg total) by mouth daily., Disp: 90 tablet, Rfl: 0   amoxicillin (AMOXIL) 500 MG capsule, Take 1 capsule (500 mg total) by mouth 2 (two) times daily for 10 days., Disp: 20 capsule, Rfl: 0   aspirin EC 81 MG tablet, Take 1 tablet (81 mg total) by mouth daily., Disp: 30 tablet, Rfl: 11   atorvastatin (LIPITOR) 40 MG tablet, TAKE 1 TABLET BY MOUTH EVERY DAY (Patient taking  differently: Take 40 mg  by mouth daily.), Disp: 90 tablet, Rfl: 3   benzonatate (TESSALON PERLES) 100 MG capsule, Take 1-2 capsules (100-200 mg total) by mouth 3 (three) times daily as needed for cough., Disp: 30 capsule, Rfl: 0   BREO ELLIPTA 200-25 MCG/INH AEPB, TAKE 1 PUFF BY MOUTH EVERY DAY (Patient taking differently: Inhale 1 puff into the lungs daily.), Disp: 60 each, Rfl: 5   buPROPion (WELLBUTRIN XL) 300 MG 24 hr tablet, TAKE 1 TABLET BY MOUTH EVERY DAY, Disp: 90 tablet, Rfl: 3   chlorpheniramine-HYDROcodone (TUSSIONEX) 10-8 MG/5ML, Take 5 mLs by mouth at bedtime as needed for cough (do not drive for 8 hours after taking)., Disp: 115 mL, Rfl: 0   Continuous Glucose Sensor (DEXCOM G7 SENSOR) MISC, 1 Device by Does not apply route as directed. Change sensor every 10 days, Disp: 3 each, Rfl: 3   cyclobenzaprine (FLEXERIL) 5 MG tablet, Take 1 tablet (5 mg total) by mouth 3 (three) times daily as needed for muscle spasms (or Headache)., Disp: 30 tablet, Rfl: 0   diltiazem (CARDIZEM CD) 120 MG 24 hr capsule, Take 1 capsule (120 mg total) by mouth daily., Disp: 90 capsule, Rfl: 3   Dulaglutide (TRULICITY) 4.5 MG/0.5ML SOPN, Inject 4.5 mg as directed once a week., Disp: 6 mL, Rfl: 3   estradiol (ESTRACE) 0.1 MG/GM vaginal cream, Place 0.5 Applicatorfuls vaginally 2 (two) times a week., Disp: , Rfl:    fenofibrate 160 MG tablet, TAKE 1 TABLET BY MOUTH EVERY DAY, Disp: 90 tablet, Rfl: 1   fluticasone (FLONASE) 50 MCG/ACT nasal spray, Place 2 sprays into both nostrils daily as needed for allergies. (Patient taking differently: Place 1 spray into both nostrils daily.), Disp: 48 mL, Rfl: 3   furosemide (LASIX) 40 MG tablet, Take 1 tablet (40 mg total) by mouth daily., Disp: , Rfl:    gabapentin (NEURONTIN) 300 MG capsule, TAKE AT NIGHT IN ADDITION TO THE 100MG  GABAPENTIN TO GET 400MG  AT NIGHT AND 100MG  IN THE MORNING AND AT MID DAY. (Patient taking differently: Take 300 mg by mouth at bedtime.), Disp: 90 capsule, Rfl: 0   glucose  blood (CONTOUR NEXT TEST) test strip, 1 each by Other route 4 (four) times daily. And lancets 4/day, Disp: 400 each, Rfl: 3   ibuprofen (ADVIL) 800 MG tablet, Take 1 tablet (800 mg total) by mouth 3 (three) times daily., Disp: 60 tablet, Rfl: 1   insulin aspart (NOVOLOG FLEXPEN) 100 UNIT/ML FlexPen, Inject 15 Units into the skin daily with supper., Disp: 15 mL, Rfl: 11   insulin glargine (LANTUS SOLOSTAR) 100 UNIT/ML Solostar Pen, Inject 16 Units into the skin every morning. (Patient taking differently: Inject 22 Units into the skin daily.), Disp: 30 mL, Rfl: 2   Insulin Pen Needle (PEN NEEDLES) 32G X 4 MM MISC, 1 each by Does not apply route 4 (four) times daily. E11.9, Disp: 400 each, Rfl: 0   ipratropium (ATROVENT) 0.06 % nasal spray, PLACE 2 SPRAYS INTO BOTH NOSTRILS 4 TIMES DAILY., Disp: 45 mL, Rfl: 3   KLOR-CON M20 20 MEQ tablet, TAKE 1 & 1/2 TABLETS BY MOUTH TWICE A DAY, Disp: 270 tablet, Rfl: 2   Lancet Devices (EASY MINI EJECT LANCING DEVICE) MISC, Use as directed 4 times daily to test blood sugar, Disp: 1 each, Rfl: 3   LINZESS 290 MCG CAPS capsule, TAKE 1 CAPSULE BY MOUTH DAILY BEFORE BREAKFAST. (Patient taking differently: as needed.), Disp: 30 capsule, Rfl: 3   metFORMIN (GLUCOPHAGE-XR) 500 MG  24 hr tablet, TAKE 4 TABLETS BY MOUTH DAILY WITH BREAKFAST., Disp: 360 tablet, Rfl: 0   Microlet Lancets MISC, 1 each by Does not apply route 2 (two) times daily. E11.9, Disp: 100 each, Rfl: 2   ondansetron (ZOFRAN-ODT) 4 MG disintegrating tablet, Take 1 tablet (4 mg total) by mouth every 8 (eight) hours as needed for nausea or vomiting., Disp: 20 tablet, Rfl: 2   oxyCODONE-acetaminophen (PERCOCET) 5-325 MG tablet, Take 1 tablet by mouth every 4 (four) hours as needed for severe pain., Disp: 30 tablet, Rfl: 0   pantoprazole (PROTONIX) 40 MG tablet, Take 1 tablet (40 mg total) by mouth 2 (two) times daily., Disp: 180 tablet, Rfl: 3   telmisartan (MICARDIS) 40 MG tablet, Take 1 tablet (40 mg total) by  mouth daily., Disp: 90 tablet, Rfl: 3   venlafaxine XR (EFFEXOR-XR) 75 MG 24 hr capsule, TAKE 1 CAPSULE BY MOUTH TWICE A DAY, Disp: 180 capsule, Rfl: 3   Review of Systems:   ROS  Vitals:   Vitals:   08/09/23 0803  BP: 126/70  Pulse: 81  Temp: (!) 97.3 F (36.3 C)  TempSrc: Temporal  SpO2: 97%  Weight: 214 lb 6.1 oz (97.2 kg)  Height: 5\' 6"  (1.676 m)     Body mass index is 34.6 kg/m.  Physical Exam:   Physical Exam Vitals and nursing note reviewed.  Constitutional:      General: She is not in acute distress.    Appearance: She is well-developed. She is not ill-appearing or toxic-appearing.  Cardiovascular:     Rate and Rhythm: Normal rate and regular rhythm.     Pulses: Normal pulses.     Heart sounds: Normal heart sounds, S1 normal and S2 normal.  Pulmonary:     Effort: Accessory muscle usage present.     Breath sounds: Decreased air movement present. Examination of the right-upper field reveals wheezing. Examination of the left-upper field reveals decreased breath sounds. Examination of the right-middle field reveals wheezing. Examination of the left-middle field reveals decreased breath sounds. Examination of the right-lower field reveals wheezing. Examination of the left-lower field reveals decreased breath sounds. Decreased breath sounds and wheezing present.  Skin:    General: Skin is warm and dry.  Neurological:     Mental Status: She is alert.     GCS: GCS eye subscore is 4. GCS verbal subscore is 5. GCS motor subscore is 6.  Psychiatric:        Speech: Speech normal.        Behavior: Behavior normal. Behavior is cooperative.     Assessment and Plan:   SOB (shortness of breath); COPD with acute exacerbation (HCC) Patient appears in mild distress Reports that when her symptom(s) get this severe, she is usually admitted to the hospital Discussed best course of action at this point is to send to emergency room as soon as possible -- her husband is with her  and agreeable to take her at this time  I, Isabelle Course, acting as a Neurosurgeon for Jarold Motto, [place]., have documented all relevant documentation on the behalf of Jarold Motto, [place], as directed by  Jarold Motto, PA while in the presence of Jarold Motto, [place].  I, Jarold Motto, [place], have reviewed all documentation for this visit. The documentation on 08/09/23 for the exam, diagnosis, procedures, and orders are all accurate and complete.  Jarold Motto, PA-C

## 2023-08-10 DIAGNOSIS — J441 Chronic obstructive pulmonary disease with (acute) exacerbation: Secondary | ICD-10-CM | POA: Diagnosis not present

## 2023-08-10 LAB — RESPIRATORY PANEL BY PCR

## 2023-08-10 LAB — BASIC METABOLIC PANEL
Anion gap: 12 (ref 5–15)
BUN: 23 mg/dL (ref 8–23)
CO2: 25 mmol/L (ref 22–32)
Calcium: 9.5 mg/dL (ref 8.9–10.3)
Chloride: 100 mmol/L (ref 98–111)
Creatinine, Ser: 1 mg/dL (ref 0.44–1.00)
GFR, Estimated: >60 mL/min (ref >60)
Glucose, Bld: 268 mg/dL — ABNORMAL HIGH (ref 70–99)
Potassium: 4.8 mmol/L (ref 3.5–5.1)
Sodium: 137 mmol/L (ref 135–145)

## 2023-08-10 LAB — CBC
HCT: 32.1 % — ABNORMAL LOW (ref 36.0–46.0)
Hemoglobin: 10.6 g/dL — ABNORMAL LOW (ref 12.0–15.0)
MCH: 30.3 pg (ref 26.0–34.0)
MCHC: 33 g/dL (ref 30.0–36.0)
MCV: 91.7 fL (ref 80.0–100.0)
Platelets: 326 10*3/uL (ref 150–400)
RBC: 3.5 MIL/uL — ABNORMAL LOW (ref 3.87–5.11)
RDW: 12.6 % (ref 11.5–15.5)
WBC: 14.2 10*3/uL — ABNORMAL HIGH (ref 4.0–10.5)
nRBC: 0 % (ref 0.0–0.2)

## 2023-08-10 LAB — GLUCOSE, CAPILLARY
Glucose-Capillary: 226 mg/dL — ABNORMAL HIGH (ref 70–99)
Glucose-Capillary: 282 mg/dL — ABNORMAL HIGH (ref 70–99)
Glucose-Capillary: 320 mg/dL — ABNORMAL HIGH (ref 70–99)
Glucose-Capillary: 228 mg/dL — ABNORMAL HIGH (ref 70–99)

## 2023-08-10 MED ORDER — INSULIN ASPART 100 UNIT/ML IJ SOLN
0.0000 [IU] | Freq: Three times a day (TID) | INTRAMUSCULAR | Status: DC
  Administered 2023-08-10: 15 [IU] via SUBCUTANEOUS
  Administered 2023-08-11: 7 [IU] via SUBCUTANEOUS
  Administered 2023-08-11: 17 [IU] via SUBCUTANEOUS

## 2023-08-10 MED ORDER — INSULIN ASPART 100 UNIT/ML IJ SOLN
0.0000 [IU] | Freq: Every day | INTRAMUSCULAR | Status: DC
  Administered 2023-08-10: 2 [IU] via SUBCUTANEOUS

## 2023-08-10 MED ORDER — AZITHROMYCIN 250 MG PO TABS
500.0000 mg | ORAL_TABLET | Freq: Every day | ORAL | Status: DC
  Administered 2023-08-10: 500 mg via ORAL
  Filled 2023-08-10: qty 2

## 2023-08-10 MED ORDER — VENLAFAXINE HCL ER 75 MG PO CP24
75.0000 mg | ORAL_CAPSULE | Freq: Two times a day (BID) | ORAL | Status: DC
  Administered 2023-08-10 – 2023-08-11 (×2): 75 mg via ORAL
  Filled 2023-08-10 (×2): qty 1

## 2023-08-10 MED ORDER — INSULIN ASPART 100 UNIT/ML IJ SOLN
10.0000 [IU] | Freq: Three times a day (TID) | INTRAMUSCULAR | Status: DC
  Administered 2023-08-10 – 2023-08-11 (×3): 10 [IU] via SUBCUTANEOUS

## 2023-08-10 MED ORDER — AZITHROMYCIN 250 MG PO TABS
500.0000 mg | ORAL_TABLET | Freq: Every day | ORAL | Status: AC
  Administered 2023-08-11: 500 mg via ORAL
  Filled 2023-08-10: qty 2

## 2023-08-10 MED ORDER — GUAIFENESIN-DM 100-10 MG/5ML PO SYRP
5.0000 mL | ORAL_SOLUTION | ORAL | Status: DC | PRN
  Administered 2023-08-10 (×2): 5 mL via ORAL
  Filled 2023-08-10 (×2): qty 5

## 2023-08-10 NOTE — Progress Notes (Signed)
Mobility Specialist - Progress Note   08/10/23 0933  Oxygen Therapy  SpO2 95 %  O2 Device Room Air  Patient Activity (if Appropriate) Ambulating  Mobility  Activity Ambulated independently in hallway  Level of Assistance Independent  Assistive Device None  Distance Ambulated (ft) 275 ft  Activity Response Tolerated well  Mobility Referral Yes  $Mobility charge 1 Mobility  Mobility Specialist Start Time (ACUTE ONLY) [id number]  Mobility Specialist Stop Time (ACUTE ONLY) 0930  Mobility Specialist Time Calculation (min) (ACUTE ONLY) 8 min   Nurse requested Mobility Specialist to perform oxygen saturation test with pt which includes removing pt from oxygen both at rest and while ambulating.  Below are the results from that testing.     Patient Saturations on Room Air at Rest = spO2 99%  Patient Saturations on Room Air while Ambulating = sp02 95% .    At end of testing pt left in room on 0  Liters of oxygen.  Reported results to nurse. Pt received in bed and agreeable to mobility. Pt took x3 standing rest breaks during session d/t SOB. Pt also had some wheezing throughout session. Upon returning to room, pt had a coughing spell. Pt to bed after session with all needs met.    Pre-mobility: 101 HR, 99% SpO2 (RA) During mobility: 110 HR, 95% SpO2 (RA) Post-mobility: 98% SPO2 (RA)  Chief Technology Officer

## 2023-08-10 NOTE — Plan of Care (Signed)

## 2023-08-10 NOTE — Progress Notes (Signed)
PROGRESS NOTE    [name]  NWG:[id number] DOB: 1960/11/07 DOA: 08/09/2023 PCP: Shelva Majestic, MD    Brief Narrative:    [name] is a 62 y.o. female with medical history significant for hypertension, asthma, pulmonary sarcoidosis, chronic bronchitis, chronic heart failure with preserved EF, GERD, insulin-dependent type 2 diabetes being admitted to the hospital for exacerbation of COPD.  Patient is typically on room air at baseline, she was diagnosed via her PCP with strep throat about 1 week ago, has been on amoxicillin and says that her sore throat symptoms have improved.  However, since that time she has had continued cough, with some clear sputum production, denies any fevers, chills, chest pain.  In the last 48 hours, she has started having some wheezing.  She was given an inhaler by her PCP, this has not been helping.  Decided to come to the ER for evaluation.  Here in the emergency department, chest x-ray is unremarkable, she has borderline leukocytosis, stable CKD.  Chest x-ray as noted below without evidence of obvious consolidation.  She was given IV Solu-Medrol, breathing treatment, empiric IV antibiotics and hospitalist admission was requested.    Assessment & Plan:   Active Problems:   Chronic diastolic CHF (congestive heart failure) (HCC)   CKD (chronic kidney disease) stage 3, GFR 30-59 ml/min (HCC)   Hypertension   Type II diabetes mellitus with renal manifestations (HCC)   Sarcoidosis of lung (HCC)   Pulmonary sarcoidosis (HCC)   Sleep apnea   Severe persistent asthma   COPD with acute exacerbation (HCC)  # Exacerbation of COPD/Asthma, sarcoidosis No fever or productive cough to strongly suspect pneumonia though hazy opacities in b/l bases could suggest infection. Today wheezing, no hypoxia. Reports significant SOB with ambulation today, doesn't feel well enough to return home. Covid/flu/rsv neg. - continue ceftriaxone/azithromycin for possible cap and  anti-inflammatory effects - continue methylpred today, likely transition to orals tomorrow assuming improving - continue duonebs - f/u RVP - consider formal pt consult tomorrow if she still doesn't feel up for discharge tomorrow  # T2DM Here glucose elevated - continue semglee - increase mealtime to home 10 - SSI  will increase to resistant  # HFpEF Appars euvolemic - cont home lasix  # GAD  - home effexor  # HTN Here bp controlled - cont home diltiazem, atorvastatin  # Chronic pain - home gabapentin  # Obesity noted   DVT prophylaxis: lovenox Code Status: full Family Communication: none @ bedside  Level of care: Med-Surg Status is: Inpatient Remains inpatient appropriate because: ongoing dyspnea    Consultants:  none  Procedures: none  Antimicrobials:  Ceftrixone/azithromycin    Subjective: Ongoing wheeze, significant dyspnea w/ ambulation, no fever or productive cough  Objective: Vitals:   08/10/23 0620 08/10/23 0714 08/10/23 0933 08/10/23 1124  BP: (!) 147/57     Pulse: 86 93    Resp: 14 18    Temp: 98.2 F (36.8 C)     TempSrc: Oral     SpO2: 97% 97% 95% 98%  Weight:      Height:        Intake/Output Summary (Last 24 hours) at 08/10/2023 1331 Last data filed at 08/10/2023 0938 Gross per 24 hour  Intake 480 ml  Output --  Net 480 ml   Filed Weights   08/09/23 0955  Weight: 97.2 kg    Examination:  General exam: Appears calm and comfortable  Respiratory system: exp wheeze throughout Cardiovascular system: S1 &  S2 heard, RRR. No JVD, murmurs, rubs, gallops or clicks.   Gastrointestinal system: Abdomen is obese, soft and nontender. No organomegaly or masses felt.   Central nervous system: Alert and oriented. No focal neurological deficits. Extremities: Symmetric 5 x 5 power. Skin: No rashes, lesions or ulcers Psychiatry: Judgement and insight appear normal. Mood & affect appropriate.     Data Reviewed: I have personally  reviewed following labs and imaging studies  CBC: Recent Labs  Lab 08/09/23 0941 08/10/23 0609  WBC 11.8* 14.2*  NEUTROABS 7.2  --   HGB 11.0* 10.6*  HCT 34.3* 32.1*  MCV 92.5 91.7  PLT 348 326   Basic Metabolic Panel: Recent Labs  Lab 08/09/23 0941 08/10/23 0609  NA 137 137  K 4.1 4.8  CL 101 100  CO2 25 25  GLUCOSE 153* 268*  BUN 23 23  CREATININE 1.06* 1.00  CALCIUM 9.2 9.5   GFR: Estimated Creatinine Clearance: 66.8 mL/min (by C-G formula based on SCr of 1 mg/dL). Liver Function Tests: No results for input(s): "AST", "ALT", "ALKPHOS", "BILITOT", "PROT", "ALBUMIN" in the last 168 hours. No results for input(s): "LIPASE", "AMYLASE" in the last 168 hours. No results for input(s): "AMMONIA" in the last 168 hours. Coagulation Profile: No results for input(s): "INR", "PROTIME" in the last 168 hours. Cardiac Enzymes: No results for input(s): "CKTOTAL", "CKMB", "CKMBINDEX", "TROPONINI" in the last 168 hours. BNP (last 3 results) No results for input(s): "PROBNP" in the last 8760 hours. HbA1C: No results for input(s): "HGBA1C" in the last 72 hours. CBG: Recent Labs  Lab 08/09/23 1615 08/09/23 2151 08/10/23 0725 08/10/23 1102  GLUCAP 264* 283* 226* 282*   Lipid Profile: No results for input(s): "CHOL", "HDL", "LDLCALC", "TRIG", "CHOLHDL", "LDLDIRECT" in the last 72 hours. Thyroid Function Tests: No results for input(s): "TSH", "T4TOTAL", "FREET4", "T3FREE", "THYROIDAB" in the last 72 hours. Anemia Panel: No results for input(s): "VITAMINB12", "FOLATE", "FERRITIN", "TIBC", "IRON", "RETICCTPCT" in the last 72 hours. Urine analysis:    Component Value Date/Time   COLORURINE YELLOW 11/17/2021 2031   APPEARANCEUR CLEAR 11/17/2021 2031   LABSPEC 1.016 11/17/2021 2031   PHURINE 5.0 11/17/2021 2031   GLUCOSEU NEGATIVE 11/17/2021 2031   HGBUR NEGATIVE 11/17/2021 2031   HGBUR negative 10/20/2010 0814   BILIRUBINUR negative 06/15/2023 1432   KETONESUR NEGATIVE  11/17/2021 2031   PROTEINUR Negative 06/15/2023 1432   PROTEINUR NEGATIVE 11/17/2021 2031   UROBILINOGEN 0.2 06/15/2023 1432   UROBILINOGEN 0.2 04/27/2015 1226   NITRITE negative 06/15/2023 1432   NITRITE NEGATIVE 11/17/2021 2031   LEUKOCYTESUR Small (1+) (A) 06/15/2023 1432   LEUKOCYTESUR SMALL (A) 11/17/2021 2031   Sepsis Labs: @LABRCNTIP (procalcitonin:4,lacticidven:4)  ) Recent Results (from the past 240 hour(s))  Resp panel by RT-PCR (RSV, Flu A&B, Covid) Anterior Nasal Swab     Status: None   Collection Time: 08/09/23  8:56 AM   Specimen: Anterior Nasal Swab  Result Value Ref Range Status   SARS Coronavirus 2 by RT PCR NEGATIVE NEGATIVE Final    Comment: (NOTE) SARS-CoV-2 target nucleic acids are NOT DETECTED.  The SARS-CoV-2 RNA is generally detectable in upper respiratory specimens during the acute phase of infection. The lowest concentration of SARS-CoV-2 viral copies this assay can detect is 138 copies/mL. A negative result does not preclude SARS-Cov-2 infection and should not be used as the sole basis for treatment or other patient management decisions. A negative result may occur with  improper specimen collection/handling, submission of specimen other than nasopharyngeal swab, presence of  viral mutation(s) within the areas targeted by this assay, and inadequate number of viral copies(<138 copies/mL). A negative result must be combined with clinical observations, patient history, and epidemiological information. The expected result is Negative.  Fact Sheet for Patients:  BloggerCourse.com  Fact Sheet for Healthcare Providers:  SeriousBroker.it  This test is no t yet approved or cleared by the [place] FDA and  has been authorized for detection and/or diagnosis of SARS-CoV-2 by FDA under an Emergency Use Authorization (EUA). This EUA will remain  in effect (meaning this test can be used) for the duration of  the COVID-19 declaration under Section 564(b)(1) of the Act, 21 U.S.C.section 360bbb-3(b)(1), unless the authorization is terminated  or revoked sooner.       Influenza A by PCR NEGATIVE NEGATIVE Final   Influenza B by PCR NEGATIVE NEGATIVE Final    Comment: (NOTE) The Xpert Xpress SARS-CoV-2/FLU/RSV plus assay is intended as an aid in the diagnosis of influenza from Nasopharyngeal swab specimens and should not be used as a sole basis for treatment. Nasal washings and aspirates are unacceptable for Xpert Xpress SARS-CoV-2/FLU/RSV testing.  Fact Sheet for Patients: BloggerCourse.com  Fact Sheet for Healthcare Providers: SeriousBroker.it  This test is not yet approved or cleared by the [place] FDA and has been authorized for detection and/or diagnosis of SARS-CoV-2 by FDA under an Emergency Use Authorization (EUA). This EUA will remain in effect (meaning this test can be used) for the duration of the COVID-19 declaration under Section 564(b)(1) of the Act, 21 U.S.C. section 360bbb-3(b)(1), unless the authorization is terminated or revoked.     Resp Syncytial Virus by PCR NEGATIVE NEGATIVE Final    Comment: (NOTE) Fact Sheet for Patients: BloggerCourse.com  Fact Sheet for Healthcare Providers: SeriousBroker.it  This test is not yet approved or cleared by the [place] FDA and has been authorized for detection and/or diagnosis of SARS-CoV-2 by FDA under an Emergency Use Authorization (EUA). This EUA will remain in effect (meaning this test can be used) for the duration of the COVID-19 declaration under Section 564(b)(1) of the Act, 21 U.S.C. section 360bbb-3(b)(1), unless the authorization is terminated or revoked.  Performed at [facility], [address], [place], [place] [address]          Radiology Studies: DG Chest Port 1  View  Result Date: 08/09/2023 CLINICAL DATA:  Shortness of breath EXAM: PORTABLE CHEST 1 VIEW COMPARISON:  Chest radiograph dated 11/09/2022 FINDINGS: Low lung volumes with bronchovascular crowding. Hazy bilateral lower lung opacities. No pleural effusion or pneumothorax. Enlarged cardiomediastinal silhouette. No acute osseous abnormality. Surgical clips project over the gastroesophageal junction. IMPRESSION: 1. Low lung volumes with bronchovascular crowding. Hazy bilateral lower lung opacities, which may represent atelectasis, aspiration, or pneumonia. 2. Cardiomegaly. Electronically Signed   By: Agustin Cree M.D.   On: 08/09/2023 09:42        Scheduled Meds:  atorvastatin  40 mg Oral Daily   azithromycin  500 mg Oral Daily   diltiazem  120 mg Oral Daily   enoxaparin (LOVENOX) injection  40 mg Subcutaneous Q24H   fenofibrate  160 mg Oral Daily   fluticasone  1 spray Each Nare Daily   furosemide  40 mg Oral Daily   gabapentin  300 mg Oral QHS   insulin aspart  0-15 Units Subcutaneous TID WC   insulin aspart  0-5 Units Subcutaneous QHS   insulin aspart  5 Units Subcutaneous TID WC   insulin glargine-yfgn  22 Units  Subcutaneous Daily   ipratropium  1 spray Each Nare QID   ipratropium-albuterol  3 mL Nebulization QID   irbesartan  150 mg Oral Daily   methylPREDNISolone (SOLU-MEDROL) injection  40 mg Intravenous Q12H   pantoprazole  40 mg Oral BID   potassium chloride  20 mEq Oral Daily   Continuous Infusions:  cefTRIAXone (ROCEPHIN)  IV 1 g (08/10/23 1119)     LOS: 1 day     Silvano Bilis, MD [facility]   If 7PM-7AM, please contact night-coverage [email or web address] Password [email or web address] 08/10/2023, 1:31 PM

## 2023-08-11 DIAGNOSIS — B348 Other viral infections of unspecified site: Secondary | ICD-10-CM

## 2023-08-11 LAB — GLUCOSE, CAPILLARY
Glucose-Capillary: 204 mg/dL — ABNORMAL HIGH (ref 70–99)
Glucose-Capillary: 247 mg/dL — ABNORMAL HIGH (ref 70–99)

## 2023-08-11 MED ORDER — FLUTICASONE PROPIONATE 50 MCG/ACT NA SUSP: 2.0000 | Freq: Every day | NASAL | 3 refills | Status: AC | PRN

## 2023-08-11 MED ORDER — IPRATROPIUM-ALBUTEROL 0.5-2.5 (3) MG/3ML IN SOLN: 3.0000 mL | Freq: Two times a day (BID) | RESPIRATORY_TRACT | Status: DC

## 2023-08-11 MED ORDER — GUAIFENESIN-DM 100-10 MG/5ML PO SYRP: 5.0000 mL | ORAL_SOLUTION | ORAL | 0 refills | Status: DC | PRN

## 2023-08-11 NOTE — Plan of Care (Signed)
Explained discharge instructions with pt and pt's husband. Both said they understood instructions. Pt discharged, vitals stable.  Problem: Education: Goal: Ability to describe self-care measures that may prevent or decrease complications (Diabetes Survival Skills Education) will improve Outcome: Adequate for Discharge Goal: Individualized Educational Video(s) Outcome: Adequate for Discharge   Problem: Coping: Goal: Ability to adjust to condition or change in health will improve Outcome: Adequate for Discharge   Problem: Fluid Volume: Goal: Ability to maintain a balanced intake and output will improve Outcome: Adequate for Discharge   Problem: Health Behavior/Discharge Planning: Goal: Ability to identify and utilize available resources and services will improve Outcome: Adequate for Discharge Goal: Ability to manage health-related needs will improve Outcome: Adequate for Discharge   Problem: Metabolic: Goal: Ability to maintain appropriate glucose levels will improve Outcome: Adequate for Discharge   Problem: Nutritional: Goal: Maintenance of adequate nutrition will improve Outcome: Adequate for Discharge Goal: Progress toward achieving an optimal weight will improve Outcome: Adequate for Discharge   Problem: Skin Integrity: Goal: Risk for impaired skin integrity will decrease Outcome: Adequate for Discharge   Problem: Tissue Perfusion: Goal: Adequacy of tissue perfusion will improve Outcome: Adequate for Discharge   Problem: Education: Goal: Knowledge of General Education information will improve Description: Including pain rating scale, medication(s)/side effects and non-pharmacologic comfort measures Outcome: Adequate for Discharge   Problem: Health Behavior/Discharge Planning: Goal: Ability to manage health-related needs will improve Outcome: Adequate for Discharge   Problem: Clinical Measurements: Goal: Ability to maintain clinical measurements within normal  limits will improve Outcome: Adequate for Discharge Goal: Will remain free from infection Outcome: Adequate for Discharge Goal: Diagnostic test results will improve Outcome: Adequate for Discharge Goal: Respiratory complications will improve Outcome: Adequate for Discharge Goal: Cardiovascular complication will be avoided Outcome: Adequate for Discharge   Problem: Activity: Goal: Risk for activity intolerance will decrease Outcome: Adequate for Discharge   Problem: Nutrition: Goal: Adequate nutrition will be maintained Outcome: Adequate for Discharge   Problem: Coping: Goal: Level of anxiety will decrease Outcome: Adequate for Discharge   Problem: Elimination: Goal: Will not experience complications related to bowel motility Outcome: Adequate for Discharge Goal: Will not experience complications related to urinary retention Outcome: Adequate for Discharge   Problem: Pain Management: Goal: General experience of comfort will improve Outcome: Adequate for Discharge   Problem: Safety: Goal: Ability to remain free from injury will improve Outcome: Adequate for Discharge   Problem: Skin Integrity: Goal: Risk for impaired skin integrity will decrease Outcome: Adequate for Discharge

## 2023-08-11 NOTE — Discharge Summary (Signed)
[facility] Discharge Summary   Patient: [name]:[id number]  PCP: Shelva Majestic, MD  Date of admission: 08/09/2023   Date of discharge:  08/11/2023     Discharge Diagnoses:  Principal Problem:   Hypertension Active Problems:   Chronic diastolic CHF (congestive heart failure) (HCC)   CKD (chronic kidney disease) stage 3, GFR 30-59 ml/min (HCC)   Type II diabetes mellitus with renal manifestations (HCC)   Sarcoidosis of lung (HCC)   Pulmonary sarcoidosis (HCC)   Sleep apnea   Severe persistent asthma   COPD with acute exacerbation (HCC)   Admitted From: Home Disposition:  Home   Recommendations for Outpatient Follow-up:  PCP: in 1 wk Follow up LABS/TEST:     Follow-up Information     Shelva Majestic, MD Follow up in 1 week(s).   Specialty: Family Medicine Contact information: [address] [place] [address] [phone]                Diet recommendation: Cardiac and Carb modified diet  Activity: The patient is advised to gradually reintroduce usual activities, as tolerated  Discharge Condition: stable  Code Status: Full code   History of present illness: As per the H and P dictated on admission. Hospital Course:  SHONNIE NORTHROP is a 62 y.o. female with medical history significant for hypertension, asthma, pulmonary sarcoidosis, chronic bronchitis, chronic heart failure with preserved EF, GERD, insulin-dependent type 2 diabetes being admitted to the hospital for exacerbation of COPD.  Patient is typically on room air at baseline, she was diagnosed via her PCP with strep throat about 1 week ago, has been on amoxicillin and says that her sore throat symptoms have improved.  However, since that time she has had continued cough, with some clear sputum production, denies any fevers, chills, chest pain.  In the last 48 hours, she has started having some wheezing.  She was given an inhaler by her PCP, this has not been helping.   Decided to come to the ER for evaluation.  Here in the emergency department, chest x-ray is unremarkable, she has borderline leukocytosis, stable CKD.  Chest x-ray as noted below without evidence of obvious consolidation.  She was given IV Solu-Medrol, breathing treatment, empiric IV antibiotics and hospitalist admission was requested.      Assessment & Plan:   Active Problems:   Chronic diastolic CHF (congestive heart failure) (HCC)   CKD (chronic kidney disease) stage 3, GFR 30-59 ml/min (HCC)   Hypertension   Type II diabetes mellitus with renal manifestations (HCC)   Sarcoidosis of lung (HCC)   Pulmonary sarcoidosis (HCC)   Sleep apnea   Severe persistent asthma   COPD with acute exacerbation (HCC)   # Exacerbation of COPD/Asthma, due to rhinovirus infection History of sarcoidosis No fever or productive cough to strongly suspect pneumonia though hazy opacities in b/l bases could suggest infection. Pt was wheezing, no hypoxia. Reports significant SOB with ambulation, doesn't feel well enough to return home. Covid/flu/rsv neg. S/p ceftriaxone/azithromycin for possible cap and anti-inflammatory effects. S/p methylpred and duonebs. RVP positive for rhinovirus infection.  Discontinued antibiotics.  Prescribed Robitussin DM on discharge for symptomatic treatment.  Recommended to follow with PCP.   # T2DM, resumed home regime, recommended to monitor CBG and continue diabetic diet.  Follow with PCP for further management. # HFpEF: Appars euvolemic, cont home lasix # GAD, home effexor # HTN and HLD : Resumed home medications. # Chronic pain: home gabapentin # Obesity Body  mass index is 34.59 kg/m.  Nutrition Interventions: Calorie restricted diet and daily exercise advised to lose body weight.  Lifestyle modification discussed.   - Patient was instructed, not to drive, operate heavy machinery, perform activities at heights, swimming or participation in water activities or provide baby  sitting services while on Pain, Sleep and Anxiety Medications; until her outpatient Physician has advised to do so again.  - Also recommended to not to take more than prescribed Pain, Sleep and Anxiety Medications.  Patient was ambulatory without any assistance. On the day of the discharge the patient's vitals were stable, and no other acute medical condition were reported by patient. the patient was felt safe to be discharge at Home.  Consultants: None Procedures: None  Discharge Exam: General: Appear in no distress, no Rash; Oral Mucosa Clear, moist. Cardiovascular: S1 and S2 Present, no Murmur, Respiratory: Equal air entry bilaterally, mild crackles and mild wheezing bilaterally. Abdomen: Bowel Sound present, Soft and no tenderness, no hernia Extremities: no Pedal edema, no calf tenderness Neurology: alert and oriented to time, place, and person affect appropriate.  Filed Weights   08/09/23 0955  Weight: 97.2 kg   Vitals:   08/11/23 0832 08/11/23 0920  BP:    Pulse:    Resp:    Temp:    SpO2: 99% 94%    DISCHARGE MEDICATION: Allergies as of 08/11/2023       Reactions   Firvanq [vancomycin] Other (See Comments)   Dose related nephrotoxicity    Trexall [methotrexate] Other (See Comments)   Peri-oral and buccal lesions   Zestril [lisinopril] Cough   Chlorhexidine Itching   Cleocin [clindamycin] Nausea And Vomiting, Rash   Lincocin [lincomycin Hcl] Nausea And Vomiting, Rash   Teflaro [ceftaroline] Rash, Other (See Comments)   Tolerates ceftriaxone         Medication List     STOP taking these medications    allopurinol 300 MG tablet Commonly known as: ZYLOPRIM       TAKE these medications    albuterol (2.5 MG/3ML) 0.083% nebulizer solution Commonly known as: PROVENTIL Take 2.5 mg by nebulization every 6 (six) hours as needed for wheezing or shortness of breath.   albuterol 108 (90 Base) MCG/ACT inhaler Commonly known as: VENTOLIN HFA Inhale 2 puffs  into the lungs every 6 (six) hours as needed for wheezing or shortness of breath.   amoxicillin 500 MG capsule Commonly known as: AMOXIL Take 1 capsule (500 mg total) by mouth 2 (two) times daily for 10 days.   aspirin EC 81 MG tablet Take 1 tablet (81 mg total) by mouth daily.   atorvastatin 40 MG tablet Commonly known as: LIPITOR TAKE 1 TABLET BY MOUTH EVERY DAY   benzonatate 100 MG capsule Commonly known as: Tessalon Perles Take 1-2 capsules (100-200 mg total) by mouth 3 (three) times daily as needed for cough.   Breo Ellipta 200-25 MCG/INH Aepb Generic drug: fluticasone furoate-vilanterol TAKE 1 PUFF BY MOUTH EVERY DAY What changed: See the new instructions.   buPROPion 300 MG 24 hr tablet Commonly known as: WELLBUTRIN XL TAKE 1 TABLET BY MOUTH EVERY DAY   chlorpheniramine-HYDROcodone 10-8 MG/5ML Commonly known as: TUSSIONEX Take 5 mLs by mouth at bedtime as needed for cough (do not drive for 8 hours after taking).   Contour Next Test test strip Generic drug: glucose blood 1 each by Other route 4 (four) times daily. And lancets 4/day   cyclobenzaprine 5 MG tablet Commonly known as: FLEXERIL Take 1 tablet (  5 mg total) by mouth 3 (three) times daily as needed for muscle spasms (or Headache).   Dexcom G7 Sensor Misc 1 Device by Does not apply route as directed. Change sensor every 10 days   diltiazem 120 MG 24 hr capsule Commonly known as: CARDIZEM CD Take 1 capsule (120 mg total) by mouth daily.   Easy Mini Eject Lancing Device Misc Use as directed 4 times daily to test blood sugar   estradiol 0.1 MG/GM vaginal cream Commonly known as: ESTRACE Place 0.5 Applicatorfuls vaginally 2 (two) times a week.   fenofibrate 160 MG tablet TAKE 1 TABLET BY MOUTH EVERY DAY   fluticasone 50 MCG/ACT nasal spray Commonly known as: FLONASE Place 2 sprays into both nostrils daily as needed for allergies. What changed:  how much to take when to take this reasons to take  this   furosemide 40 MG tablet Commonly known as: LASIX Take 1 tablet (40 mg total) by mouth daily.   gabapentin 300 MG capsule Commonly known as: NEURONTIN TAKE AT NIGHT IN ADDITION TO THE 100MG  GABAPENTIN TO GET 400MG  AT NIGHT AND 100MG  IN THE MORNING AND AT MID DAY. What changed: See the new instructions.   guaiFENesin-dextromethorphan 100-10 MG/5ML syrup Commonly known as: ROBITUSSIN DM Take 5 mLs by mouth every 4 (four) hours as needed for cough (chest congestion).   ibuprofen 800 MG tablet Commonly known as: ADVIL Take 1 tablet (800 mg total) by mouth 3 (three) times daily. What changed:  when to take this reasons to take this   ipratropium 0.06 % nasal spray Commonly known as: ATROVENT PLACE 2 SPRAYS INTO BOTH NOSTRILS 4 TIMES DAILY.   Klor-Con M20 20 MEQ tablet Generic drug: potassium chloride SA TAKE 1 & 1/2 TABLETS BY MOUTH TWICE A DAY What changed: See the new instructions.   Lantus SoloStar 100 UNIT/ML Solostar Pen Generic drug: insulin glargine Inject 16 Units into the skin every morning. What changed:  how much to take when to take this   Linzess 290 MCG Caps capsule Generic drug: linaclotide TAKE 1 CAPSULE BY MOUTH DAILY BEFORE BREAKFAST. What changed: See the new instructions.   metFORMIN 500 MG 24 hr tablet Commonly known as: GLUCOPHAGE-XR TAKE 4 TABLETS BY MOUTH DAILY WITH BREAKFAST.   Microlet Lancets Misc 1 each by Does not apply route 2 (two) times daily. E11.9   NovoLOG FlexPen 100 UNIT/ML FlexPen Generic drug: insulin aspart Inject 15 Units into the skin daily with supper.   ondansetron 4 MG disintegrating tablet Commonly known as: ZOFRAN-ODT Take 1 tablet (4 mg total) by mouth every 8 (eight) hours as needed for nausea or vomiting.   oxyCODONE-acetaminophen 5-325 MG tablet Commonly known as: Percocet Take 1 tablet by mouth every 4 (four) hours as needed for severe pain.   pantoprazole 40 MG tablet Commonly known as: PROTONIX Take  1 tablet (40 mg total) by mouth 2 (two) times daily.   Pen Needles 32G X 4 MM Misc 1 each by Does not apply route 4 (four) times daily. E11.9   telmisartan 40 MG tablet Commonly known as: MICARDIS Take 1 tablet (40 mg total) by mouth daily.   Trulicity 4.5 MG/0.5ML Soaj Generic drug: Dulaglutide Inject 4.5 mg as directed once a week. What changed: additional instructions   venlafaxine XR 75 MG 24 hr capsule Commonly known as: EFFEXOR-XR TAKE 1 CAPSULE BY MOUTH TWICE A DAY       Allergies  Allergen Reactions   Firvanq [Vancomycin] Other (See Comments)  Dose related nephrotoxicity    Trexall [Methotrexate] Other (See Comments)    Peri-oral and buccal lesions   Zestril [Lisinopril] Cough   Chlorhexidine Itching   Cleocin [Clindamycin] Nausea And Vomiting and Rash   Lincocin [Lincomycin Hcl] Nausea And Vomiting and Rash   Teflaro [Ceftaroline] Rash and Other (See Comments)    Tolerates ceftriaxone    Discharge Instructions     Call MD for:  difficulty breathing, headache or visual disturbances   Complete by: As directed    Call MD for:  extreme fatigue   Complete by: As directed    Call MD for:  persistant dizziness or light-headedness   Complete by: As directed    Call MD for:  persistant nausea and vomiting   Complete by: As directed    Call MD for:  severe uncontrolled pain   Complete by: As directed    Call MD for:  temperature >100.4   Complete by: As directed    Diet - low sodium heart healthy   Complete by: As directed    Discharge instructions   Complete by: As directed    Follow-up with PCP in 1 week   Increase activity slowly   Complete by: As directed        The results of significant diagnostics from this hospitalization (including imaging, microbiology, ancillary and laboratory) are listed below for reference.    Significant Diagnostic Studies: DG Chest Port 1 View  Result Date: 08/09/2023 CLINICAL DATA:  Shortness of breath EXAM: PORTABLE  CHEST 1 VIEW COMPARISON:  Chest radiograph dated 11/09/2022 FINDINGS: Low lung volumes with bronchovascular crowding. Hazy bilateral lower lung opacities. No pleural effusion or pneumothorax. Enlarged cardiomediastinal silhouette. No acute osseous abnormality. Surgical clips project over the gastroesophageal junction. IMPRESSION: 1. Low lung volumes with bronchovascular crowding. Hazy bilateral lower lung opacities, which may represent atelectasis, aspiration, or pneumonia. 2. Cardiomegaly. Electronically Signed   By: Agustin Cree M.D.   On: 08/09/2023 09:42    Microbiology: Recent Results (from the past 240 hour(s))  Resp panel by RT-PCR (RSV, Flu A&B, Covid) Anterior Nasal Swab     Status: None   Collection Time: 08/09/23  8:56 AM   Specimen: Anterior Nasal Swab  Result Value Ref Range Status   SARS Coronavirus 2 by RT PCR NEGATIVE NEGATIVE Final    Comment: (NOTE) SARS-CoV-2 target nucleic acids are NOT DETECTED.  The SARS-CoV-2 RNA is generally detectable in upper respiratory specimens during the acute phase of infection. The lowest concentration of SARS-CoV-2 viral copies this assay can detect is 138 copies/mL. A negative result does not preclude SARS-Cov-2 infection and should not be used as the sole basis for treatment or other patient management decisions. A negative result may occur with  improper specimen collection/handling, submission of specimen other than nasopharyngeal swab, presence of viral mutation(s) within the areas targeted by this assay, and inadequate number of viral copies(<138 copies/mL). A negative result must be combined with clinical observations, patient history, and epidemiological information. The expected result is Negative.  Fact Sheet for Patients:  BloggerCourse.com  Fact Sheet for Healthcare Providers:  SeriousBroker.it  This test is no t yet approved or cleared by the [place] FDA and  has been  authorized for detection and/or diagnosis of SARS-CoV-2 by FDA under an Emergency Use Authorization (EUA). This EUA will remain  in effect (meaning this test can be used) for the duration of the COVID-19 declaration under Section 564(b)(1) of the Act, 21 U.S.C.section 360bbb-3(b)(1), unless the authorization  is terminated  or revoked sooner.       Influenza A by PCR NEGATIVE NEGATIVE Final   Influenza B by PCR NEGATIVE NEGATIVE Final    Comment: (NOTE) The Xpert Xpress SARS-CoV-2/FLU/RSV plus assay is intended as an aid in the diagnosis of influenza from Nasopharyngeal swab specimens and should not be used as a sole basis for treatment. Nasal washings and aspirates are unacceptable for Xpert Xpress SARS-CoV-2/FLU/RSV testing.  Fact Sheet for Patients: BloggerCourse.com  Fact Sheet for Healthcare Providers: SeriousBroker.it  This test is not yet approved or cleared by the [place] FDA and has been authorized for detection and/or diagnosis of SARS-CoV-2 by FDA under an Emergency Use Authorization (EUA). This EUA will remain in effect (meaning this test can be used) for the duration of the COVID-19 declaration under Section 564(b)(1) of the Act, 21 U.S.C. section 360bbb-3(b)(1), unless the authorization is terminated or revoked.     Resp Syncytial Virus by PCR NEGATIVE NEGATIVE Final    Comment: (NOTE) Fact Sheet for Patients: BloggerCourse.com  Fact Sheet for Healthcare Providers: SeriousBroker.it  This test is not yet approved or cleared by the [place] FDA and has been authorized for detection and/or diagnosis of SARS-CoV-2 by FDA under an Emergency Use Authorization (EUA). This EUA will remain in effect (meaning this test can be used) for the duration of the COVID-19 declaration under Section 564(b)(1) of the Act, 21 U.S.C. section 360bbb-3(b)(1), unless the  authorization is terminated or revoked.  Performed at [facility], [address], [place], [place] [address]   Respiratory (~20 pathogens) panel by PCR     Status: Abnormal   Collection Time: 08/10/23  2:18 PM   Specimen: Nasopharyngeal Swab; Respiratory  Result Value Ref Range Status   Adenovirus NOT DETECTED NOT DETECTED Final   Coronavirus 229E NOT DETECTED NOT DETECTED Final    Comment: (NOTE) The Coronavirus on the Respiratory Panel, DOES NOT test for the novel  Coronavirus (2019 nCoV)    Coronavirus HKU1 NOT DETECTED NOT DETECTED Final   Coronavirus NL63 NOT DETECTED NOT DETECTED Final   Coronavirus OC43 NOT DETECTED NOT DETECTED Final   Metapneumovirus NOT DETECTED NOT DETECTED Final   Rhinovirus / Enterovirus DETECTED (A) NOT DETECTED Final   Influenza A NOT DETECTED NOT DETECTED Final   Influenza B NOT DETECTED NOT DETECTED Final   Parainfluenza Virus 1 NOT DETECTED NOT DETECTED Final   Parainfluenza Virus 2 NOT DETECTED NOT DETECTED Final   Parainfluenza Virus 3 NOT DETECTED NOT DETECTED Final   Parainfluenza Virus 4 NOT DETECTED NOT DETECTED Final   Respiratory Syncytial Virus NOT DETECTED NOT DETECTED Final   Bordetella pertussis NOT DETECTED NOT DETECTED Final   Bordetella Parapertussis NOT DETECTED NOT DETECTED Final   Chlamydophila pneumoniae NOT DETECTED NOT DETECTED Final   Mycoplasma pneumoniae NOT DETECTED NOT DETECTED Final    Comment: Performed at [facility], [address] [place], [place] [address]     Labs: CBC: Recent Labs  Lab 08/09/23 0941 08/10/23 0609  WBC 11.8* 14.2*  NEUTROABS 7.2  --   HGB 11.0* 10.6*  HCT 34.3* 32.1*  MCV 92.5 91.7  PLT 348 326   Basic Metabolic Panel: Recent Labs  Lab 08/09/23 0941 08/10/23 0609  NA 137 137  K 4.1 4.8  CL 101 100  CO2 25 25  GLUCOSE 153* 268*  BUN 23 23  CREATININE 1.06* 1.00  CALCIUM 9.2 9.5   Liver Function Tests: No results for  input(s): "AST", "ALT",  "ALKPHOS", "BILITOT", "PROT", "ALBUMIN" in the last 168 hours. No results for input(s): "LIPASE", "AMYLASE" in the last 168 hours. No results for input(s): "AMMONIA" in the last 168 hours. Cardiac Enzymes: No results for input(s): "CKTOTAL", "CKMB", "CKMBINDEX", "TROPONINI" in the last 168 hours. BNP (last 3 results) Recent Labs    11/09/22 1850  BNP 21.0   CBG: Recent Labs  Lab 08/10/23 1102 08/10/23 1615 08/10/23 2135 08/11/23 0724 08/11/23 1104  GLUCAP 282* 320* 228* 204* 247*    Time spent: 35 minutes  Signed:  Gillis Santa  [facility] 08/11/2023 1:34 PM

## 2023-08-11 NOTE — Plan of Care (Signed)

## 2023-08-11 NOTE — Progress Notes (Signed)
Mobility Specialist - Progress Note   08/11/23 0920  Oxygen Therapy  SpO2 94 %  O2 Device Room Air  Patient Activity (if Appropriate) Ambulating  Mobility  Activity Ambulated independently in hallway  Level of Assistance Independent  Assistive Device None  Distance Ambulated (ft) 500 ft  Activity Response Tolerated well  Mobility Referral Yes  $Mobility charge 1 Mobility  Mobility Specialist Start Time (ACUTE ONLY) 0910  Mobility Specialist Stop Time (ACUTE ONLY) 0919  Mobility Specialist Time Calculation (min) (ACUTE ONLY) 9 min   Pt received in room standing and agreeable to mobility. No complaints during session. Pt to room after session with all needs met.    Pre-mobility: 95% SpO2 During mobility: 94% SpO2 Post-mobility: 93% SPO2  Chief Technology Officer

## 2023-08-12 ENCOUNTER — Telehealth: Payer: Self-pay

## 2023-08-12 ENCOUNTER — Ambulatory Visit: Payer: Medicare Other

## 2023-08-12 NOTE — Transitions of Care (Post Inpatient/ED Visit) (Signed)
08/12/2023  Name: [name] MRN: [id number] DOB: 03-15-1961  Today's TOC FU Call Status: Today's TOC FU Call Status:: Successful TOC FU Call Completed TOC FU Call Complete Date: 08/12/23 Patient's Name and Date of Birth confirmed.  Transition Care Management Follow-up Telephone Call Date of Discharge: 08/11/23 Discharge Facility: [facility]) Type of Discharge: Inpatient Admission Primary Inpatient Discharge Diagnosis:: "COPD exacerbation" How have you been since you were released from the hospital?: Better (Pt pleased to report she is doing well-"breathing much better- no SOB, just a very mild occassionall dry cough"-rested well last night, eating good,she has been up walking & moving around with no issues) Any questions or concerns?: No  Items Reviewed: Did you receive and understand the discharge instructions provided?: Yes Medications obtained,verified, and reconciled?: Yes (Medications Reviewed) Any new allergies since your discharge?: No Dietary orders reviewed?: Yes Type of Diet Ordered:: low salt/heart healthy/carb modified Do you have support at home?: Yes People in Home: spouse Name of Support/Comfort Primary Source: spouse  Medications Reviewed Today: Medications Reviewed Today     Reviewed by Charlyn Minerva, RN (Registered Nurse) on 08/12/23 at 1130  Med List Status: <None>   Medication Order Taking? Sig Documenting Provider Last Dose Status Informant  albuterol (PROVENTIL) (2.5 MG/3ML) 0.083% nebulizer solution [id number] Yes Take 2.5 mg by nebulization every 6 (six) hours as needed for wheezing or shortness of breath. [provider] Taking Active Spouse/Significant Other  albuterol (VENTOLIN HFA) 108 (90 Base) MCG/ACT inhaler [id number] Yes Inhale 2 puffs into the lungs every 6 (six) hours as needed for wheezing or shortness of breath. Ardith Dark, MD Taking Active Spouse/Significant Other  amoxicillin (AMOXIL) 500 MG capsule  [id number] Yes Take 1 capsule (500 mg total) by mouth 2 (two) times daily for 10 days. Ardith Dark, MD Taking Active Spouse/Significant Other  aspirin EC 81 MG tablet [id number] Yes Take 1 tablet (81 mg total) by mouth daily. Marykay Lex, MD Taking Active Spouse/Significant Other  atorvastatin (LIPITOR) 40 MG tablet [id number] Yes TAKE 1 TABLET BY MOUTH EVERY DAY  Patient taking differently: Take 40 mg by mouth daily.   Shelva Majestic, MD Taking Active Spouse/Significant Other  benzonatate (TESSALON PERLES) 100 MG capsule [id number] Yes Take 1-2 capsules (100-200 mg total) by mouth 3 (three) times daily as needed for cough. Dulce Sellar, NP Taking Active Spouse/Significant Other  BREO ELLIPTA 200-25 MCG/INH AEPB [id number] Yes TAKE 1 PUFF BY MOUTH EVERY DAY  Patient taking differently: Inhale 1 puff into the lungs daily.   Shelva Majestic, MD Taking Active Spouse/Significant Other           Med Note Kandis Cocking Alinda Dooms A   Tue Aug 09, 2023  9:30 PM)    buPROPion (WELLBUTRIN XL) 300 MG 24 hr tablet [id number] Yes TAKE 1 TABLET BY MOUTH EVERY DAY Shelva Majestic, MD Taking Active Spouse/Significant Other  chlorpheniramine-HYDROcodone (TUSSIONEX) 10-8 MG/5ML [id number] Yes Take 5 mLs by mouth at bedtime as needed for cough (do not drive for 8 hours after taking). Ardith Dark, MD Taking Active Spouse/Significant Other  Continuous Glucose Sensor (DEXCOM G7 SENSOR) MISC [id number] Yes 1 Device by Does not apply route as directed. Change sensor every 10 days Altamese Braham, MD Taking Active Spouse/Significant Other  cyclobenzaprine (FLEXERIL) 5 MG tablet [id number] Yes Take 1 tablet (5 mg total) by mouth 3 (three) times daily as needed for muscle spasms (or Headache). Dulce Sellar, NP Taking Active Spouse/Significant Other  diltiazem (CARDIZEM  CD) 120 MG 24 hr capsule [id number] Yes Take 1 capsule (120 mg total) by mouth daily. Corrin Parker, PA-C Taking Active  Spouse/Significant Other  Dulaglutide (TRULICITY) 4.5 MG/0.5ML SOPN [id number] Yes Inject 4.5 mg as directed once a week.  Patient taking differently: Inject 4.5 mg as directed once a week. Mondays   Altamese [place], MD Taking Active Spouse/Significant Other  estradiol (ESTRACE) 0.1 MG/GM vaginal cream [id number] Yes Place 0.5 Applicatorfuls vaginally 2 (two) times a week. [provider] Taking Active Spouse/Significant Other           Med Note Kandis Cocking Ronnald Nian   Tue Aug 09, 2023  9:36 PM) Pt uses prn  fenofibrate 160 MG tablet [id number] Yes TAKE 1 TABLET BY MOUTH EVERY DAY Marykay Lex, MD Taking Active Spouse/Significant Other  fluticasone (FLONASE) 50 MCG/ACT nasal spray [id number] Yes Place 2 sprays into both nostrils daily as needed for allergies. Gillis Santa, MD Taking Active   furosemide (LASIX) 40 MG tablet [id number] Yes Take 1 tablet (40 mg total) by mouth daily. Lonia Blood, MD Taking Active Spouse/Significant Other  gabapentin (NEURONTIN) 300 MG capsule [id number] Yes TAKE AT NIGHT IN ADDITION TO THE 100MG  GABAPENTIN TO GET 400MG  AT NIGHT AND 100MG  IN THE MORNING AND AT MID DAY.  Patient taking differently: Take 300 mg by mouth at bedtime.   Rodolph Bong, MD Taking Active Spouse/Significant Other  glucose blood (CONTOUR NEXT TEST) test strip [id number] Yes 1 each by Other route 4 (four) times daily. And lancets 4/day Romero Belling, MD Taking Active Spouse/Significant Other  guaiFENesin-dextromethorphan [facility] DM) 100-10 MG/5ML syrup [id number] Yes Take 5 mLs by mouth every 4 (four) hours as needed for cough (chest congestion). Gillis Santa, MD Taking Active   ibuprofen (ADVIL) 800 MG tablet [id number] Yes Take 1 tablet (800 mg total) by mouth 3 (three) times daily.  Patient taking differently: Take 800 mg by mouth 3 (three) times daily as needed for moderate pain (pain score 4-6).   Felecia Shelling, DPM Taking Active Spouse/Significant Other  insulin  aspart (NOVOLOG FLEXPEN) 100 UNIT/ML FlexPen [id number] Yes Inject 15 Units into the skin daily with supper. Altamese Baroda, MD Taking Active Spouse/Significant Other  insulin glargine (LANTUS SOLOSTAR) 100 UNIT/ML Solostar Pen [id number] Yes Inject 16 Units into the skin every morning.  Patient taking differently: Inject 22 Units into the skin daily.   Romero Belling, MD Taking Active Spouse/Significant Other  Insulin Pen Needle (PEN NEEDLES) 32G X 4 MM MISC [id number] Yes 1 each by Does not apply route 4 (four) times daily. E11.9 Romero Belling, MD Taking Active Spouse/Significant Other  ipratropium (ATROVENT) 0.06 % nasal spray [id number] Yes PLACE 2 SPRAYS INTO BOTH NOSTRILS 4 TIMES DAILY. Shelva Majestic, MD Taking Active Spouse/Significant Other  KLOR-CON M20 20 MEQ tablet [id number] Yes TAKE 1 & 1/2 TABLETS BY MOUTH TWICE A DAY  Patient taking differently: 20 mEq 2 (two) times daily.   Shelva Majestic, MD Taking Active Spouse/Significant Other  Lancet Devices (EASY MINI EJECT LANCING DEVICE) MISC [id number] Yes Use as directed 4 times daily to test blood sugar Romero Belling, MD Taking Active Spouse/Significant Other  LINZESS 290 MCG CAPS capsule [id number] Yes TAKE 1 CAPSULE BY MOUTH DAILY BEFORE BREAKFAST.  Patient taking differently: as needed.   Hilarie Fredrickson, MD Taking Active Spouse/Significant Other           Med Note Epimenio Sarin, Edison Nasuti Aug 09, 2023 12:28 PM)  metFORMIN (GLUCOPHAGE-XR) 500 MG 24 hr tablet [id number] Yes TAKE 4 TABLETS BY MOUTH DAILY WITH BREAKFAST. Altamese Hurricane, MD Taking Active Spouse/Significant Other  Microlet Lancets MISC [id number] Yes 1 each by Does not apply route 2 (two) times daily. E11.9 Romero Belling, MD Taking Active Spouse/Significant Other    Discontinued 03/28/18 1659   ondansetron (ZOFRAN-ODT) 4 MG disintegrating tablet [id number] Yes Take 1 tablet (4 mg total) by mouth every 8 (eight) hours as needed for nausea or vomiting. Shelva Majestic, MD Taking  Active Spouse/Significant Other  oxyCODONE-acetaminophen (PERCOCET) 5-325 MG tablet [id number] Yes Take 1 tablet by mouth every 4 (four) hours as needed for severe pain. Felecia Shelling, DPM Taking Active Spouse/Significant Other  pantoprazole (PROTONIX) 40 MG tablet [id number] Yes Take 1 tablet (40 mg total) by mouth 2 (two) times daily. Hilarie Fredrickson, MD Taking Active Spouse/Significant Other  telmisartan (MICARDIS) 40 MG tablet [id number] Yes Take 1 tablet (40 mg total) by mouth daily. Corrin Parker, PA-C Taking Active Spouse/Significant Other  venlafaxine XR (EFFEXOR-XR) 75 MG 24 hr capsule [id number] Yes TAKE 1 CAPSULE BY MOUTH TWICE A DAY Shelva Majestic, MD Taking Active Spouse/Significant Other            Home Care and Equipment/Supplies: Were Home Health Services Ordered?: NA Any new equipment or medical supplies ordered?: NA  Functional Questionnaire: Do you need assistance with bathing/showering or dressing?: No Do you need assistance with meal preparation?: No Do you need assistance with eating?: No Do you have difficulty maintaining continence: No Do you need assistance with getting out of bed/getting out of a chair/moving?: No Do you have difficulty managing or taking your medications?: No  Follow up appointments reviewed: PCP Follow-up appointment confirmed?: No (Care guide attempted to assist with making PCP appt during call-PCP with no openings for several wks-pt did not want to see another provider-she will call office to request appt) MD Provider Line [phone] Given: No Specialist Hospital Follow-up appointment confirmed?: Yes Date of Specialist follow-up appointment?: 08/23/23 Follow-Up Specialty Provider:: Dr. Isaiah Serge Do you need transportation to your follow-up appointment?: No (pt confirms she is able to get to her appts) Do you understand care options if your condition(s) worsen?: Yes-patient verbalized understanding  SDOH Interventions Today     Flowsheet Row Most Recent Value  SDOH Interventions   Food Insecurity Interventions Intervention Not Indicated  Transportation Interventions Intervention Not Indicated       Antionette Fairy, RN,BSN,CCM RN Care Manager Transitions of Care  [place]  Direct Phone: [phone] Toll Free: [phone] Fax: [phone]

## 2023-08-18 ENCOUNTER — Ambulatory Visit: Payer: Medicare Other | Admitting: Family Medicine

## 2023-08-18 ENCOUNTER — Encounter: Payer: Self-pay | Admitting: Family Medicine

## 2023-08-18 VITALS — BP 132/76 | HR 85 | Temp 97.9°F | Ht 66.0 in | Wt 215.4 lb

## 2023-08-18 DIAGNOSIS — Z794 Long term (current) use of insulin: Secondary | ICD-10-CM

## 2023-08-18 DIAGNOSIS — I10 Essential (primary) hypertension: Secondary | ICD-10-CM

## 2023-08-18 DIAGNOSIS — N1831 Chronic kidney disease, stage 3a: Secondary | ICD-10-CM

## 2023-08-18 DIAGNOSIS — J4550 Severe persistent asthma, uncomplicated: Secondary | ICD-10-CM | POA: Diagnosis not present

## 2023-08-18 DIAGNOSIS — B9689 Other specified bacterial agents as the cause of diseases classified elsewhere: Secondary | ICD-10-CM

## 2023-08-18 DIAGNOSIS — J329 Chronic sinusitis, unspecified: Secondary | ICD-10-CM | POA: Diagnosis not present

## 2023-08-18 DIAGNOSIS — E1122 Type 2 diabetes mellitus with diabetic chronic kidney disease: Secondary | ICD-10-CM | POA: Diagnosis not present

## 2023-08-18 DIAGNOSIS — D860 Sarcoidosis of lung: Secondary | ICD-10-CM

## 2023-08-18 DIAGNOSIS — I5032 Chronic diastolic (congestive) heart failure: Secondary | ICD-10-CM

## 2023-08-18 MED ORDER — ONDANSETRON 4 MG PO TBDP: 4.0000 mg | ORAL_TABLET | Freq: Three times a day (TID) | ORAL | 0 refills | Status: DC | PRN

## 2023-08-18 MED ORDER — PREDNISONE 20 MG PO TABS: ORAL_TABLET | ORAL | 0 refills | Status: DC

## 2023-08-18 MED ORDER — AMOXICILLIN-POT CLAVULANATE 875-125 MG PO TABS: 1.0000 | ORAL_TABLET | Freq: Two times a day (BID) | ORAL | 0 refills | Status: AC

## 2023-08-18 NOTE — Progress Notes (Signed)
Phone [phone]   Subjective:  [name] is a 62 y.o. year old very pleasant female patient who presents for transitional care management and hospital follow up for asthma/pulmonary sarcoidosis flare up due to rhinovirus. Patient was hospitalized from 08/09/2023 to 08/11/2023. A TCM phone call was completed on 08/12/23. Medical complexity moderate  Per hospital discharge patient was admitted for exacerbation of COPD BUT the patient does not carry COPD diagnosis-instead pulmonary sarcoidosis as well as asthma only.    She had been diagnosed with strep throat a week prior and been on amoxicillin for a week with improving sore throat but she had continued cough, clear sputum production without fever, chills, chest pain.  48 hours prior to admission she started having wheezing and she noted no improvement with inhaler that she had at home and decided to come to ER for evaluation.  No obvious pneumonia/consolidation on chest x-ray though some hazy opacifications in lung bases -Patient given IV Solu-Medrol, breathing treatments, empiric IV antibiotics and was admitted -COVID, flu were negative as well as RSV.  Rhinovirus was positive.  She had initial antibiotics but these were discontinued after rhinovirus diagnosis -She was discharged on Breo as per her baseline 20-25 mcg 1 puff per day- we reflected today on this and she stated actually was not taking prior to hospitalization but is consistent now   No obvious exacerbation of her heart failure with preserved ejection fraction in the hospital.  Diabetes was maintained with monitoring and diabetic diet and home meds (including Trulicity 4.5 mg and Lantus 16 units and metformin 2000 mg extended release daily) were restarted on discharge.  Anxiety she was continued on Effexor 75 mg twice daily and Wellbutrin 300 mg.  Hypertension (on diltiazem 120 mg extended release, Lasix 40 mg, telmisartan 40 mg) and hyperlipidemia were maintained on home  medication.  She is maintained on gabapentin for chronic pain  Today she reports, ongoing fatigue, some nausea. This has beeen hard for her as normally bounces back even if she gets sick and has to go to hospital. Still coughing but dry. Not wheezing much. Cough has eased off some. Still no fever at home. Still feels chest congestion. Shortness of breath much improved very close to baseline initially reported but on further questioning albuterol maybe 2-3 x a day though which is higher than usual as she doesn't normally use this - she also reports while in hospital developed worsening sinus pressure and congestion though she is not able to blow anything out. Some hoarseness  I personally reviewed chest x-ray from 08/09/2023-normal airway, no obvious bony abnormalities, potential cardiomegaly, slight opacifications in bilateral lower lung fields, right hemidiaphragm appears mildly elevated.  Low lung volumes noted   See problem oriented charting as well  Past Medical History-  Patient Active Problem List   Diagnosis Date Noted   Severe persistent asthma 01/04/2019    Priority: High   Subcortical microvascular ischemic occlusive disease 07/12/2018    Priority: High   Chronic diastolic CHF (congestive heart failure) (HCC) 08/13/2017    Priority: High   Pulmonary sarcoidosis (HCC)     Priority: High   Chronic bronchitis (HCC) in never smoker 09/18/2013    Priority: High   Chest pain 04/11/2013    Priority: High   Hypertensive heart disease with chronic diastolic congestive heart failure (HCC)     Priority: High   Sarcoidosis of lung (HCC) 04/10/2007    Priority: High   Type II diabetes mellitus with renal manifestations (HCC)  08/21/2006    Priority: High   Iron deficiency anemia 10/01/2020    Priority: Medium    Hypertriglyceridemia 04/02/2020    Priority: Medium    Aortic atherosclerosis (HCC) 02/14/2020    Priority: Medium    Constipation 04/21/2018    Priority: Medium     Epiglottitis     Priority: Medium    Osteoarthritis of right knee 01/26/2017    Priority: Medium    CKD (chronic kidney disease) stage 3, GFR 30-59 ml/min (HCC) 04/27/2015    Priority: Medium    History of osteomyelitis 03/27/2015    Priority: Medium    Fatty liver 09/30/2014    Priority: Medium    Major depression in full remission [facility])     Priority: Medium    Gout 08/20/2010    Priority: Medium    Deficiency anemia 09/18/2009    Priority: Medium    Sleep apnea 04/21/2009    Priority: Medium    Hyperlipidemia associated with type 2 diabetes mellitus (HCC) 08/21/2006    Priority: Medium    Hypertension 08/21/2006    Priority: Medium    Nontraumatic complete tear of left rotator cuff     Priority: Low   Diabetic neuropathy (HCC) 02/20/2019    Priority: Low   Rapid palpitations 07/12/2018    Priority: Low   Impingement of left ankle joint 06/26/2018    Priority: Low   Total knee replacement status, left 12/07/2017    Priority: Low   Dysphagia 09/20/2017    Priority: Low   Plantar fasciitis, right 07/13/2017    Priority: Low   S/P total knee arthroplasty, right 06/29/2017    Priority: Low   Osteoarthrosis, localized, primary, knee, right     Priority: Low   Sprain of calcaneofibular ligament of right ankle 06/06/2017    Priority: Low   Onychomycosis 10/27/2015    Priority: Low   MRSA (methicillin resistant staph aureus) culture positive 03/27/2015    Priority: Low   Hot flashes 07/15/2014    Priority: Low   Abnormal SPEP 04/17/2014    Priority: Low   Fracture of left leg 04/17/2014    Priority: Low   Internal hemorrhoids     Priority: Low   Preoperative clearance 03/25/2014    Priority: Low   Solitary pulmonary nodule, on CT 02/2013 - stable over 2 years in 2015 02/20/2013    Priority: Low   GERD 08/21/2006    Priority: Low   Chronic pain of left wrist 04/19/2022    Priority: 1.   Facet arthropathy, cervical 06/29/2021    Priority: 1.   Foraminal stenosis  of cervical region 06/29/2021    Priority: 1.   COPD with acute exacerbation (HCC) 08/09/2023   Preop cardiovascular exam 12/07/2021   Postural dizziness with presyncope 09/30/2020    Medications- reviewed and updated  A medical reconciliation was performed comparing current medicines to hospital discharge medications. Current Outpatient Medications  Medication Sig Dispense Refill   albuterol (PROVENTIL) (2.5 MG/3ML) 0.083% nebulizer solution Take 2.5 mg by nebulization every 6 (six) hours as needed for wheezing or shortness of breath.     albuterol (VENTOLIN HFA) 108 (90 Base) MCG/ACT inhaler Inhale 2 puffs into the lungs every 6 (six) hours as needed for wheezing or shortness of breath. 8 g 0   aspirin EC 81 MG tablet Take 1 tablet (81 mg total) by mouth daily. 30 tablet 11   atorvastatin (LIPITOR) 40 MG tablet TAKE 1 TABLET BY MOUTH EVERY DAY (Patient taking  differently: Take 40 mg by mouth daily.) 90 tablet 3   BREO ELLIPTA 200-25 MCG/INH AEPB TAKE 1 PUFF BY MOUTH EVERY DAY (Patient taking differently: Inhale 1 puff into the lungs daily.) 60 each 5   buPROPion (WELLBUTRIN XL) 300 MG 24 hr tablet TAKE 1 TABLET BY MOUTH EVERY DAY 90 tablet 3   Continuous Glucose Sensor (DEXCOM G7 SENSOR) MISC 1 Device by Does not apply route as directed. Change sensor every 10 days 3 each 3   diltiazem (CARDIZEM CD) 120 MG 24 hr capsule Take 1 capsule (120 mg total) by mouth daily. 90 capsule 3   Dulaglutide (TRULICITY) 4.5 MG/0.5ML SOPN Inject 4.5 mg as directed once a week. (Patient taking differently: Inject 4.5 mg as directed once a week. Mondays) 6 mL 3   estradiol (ESTRACE) 0.1 MG/GM vaginal cream Place 0.5 Applicatorfuls vaginally 2 (two) times a week.     fenofibrate 160 MG tablet TAKE 1 TABLET BY MOUTH EVERY DAY 90 tablet 1   fluticasone (FLONASE) 50 MCG/ACT nasal spray Place 2 sprays into both nostrils daily as needed for allergies. 48 mL 3   furosemide (LASIX) 40 MG tablet Take 1 tablet (40 mg  total) by mouth daily.     gabapentin (NEURONTIN) 300 MG capsule TAKE AT NIGHT IN ADDITION TO THE 100MG  GABAPENTIN TO GET 400MG  AT NIGHT AND 100MG  IN THE MORNING AND AT MID DAY. (Patient taking differently: Take 300 mg by mouth at bedtime.) 90 capsule 0   glucose blood (CONTOUR NEXT TEST) test strip 1 each by Other route 4 (four) times daily. And lancets 4/day 400 each 3   ibuprofen (ADVIL) 800 MG tablet Take 1 tablet (800 mg total) by mouth 3 (three) times daily. (Patient taking differently: Take 800 mg by mouth 3 (three) times daily as needed for moderate pain (pain score 4-6).) 60 tablet 1   insulin aspart (NOVOLOG FLEXPEN) 100 UNIT/ML FlexPen Inject 15 Units into the skin daily with supper. 15 mL 11   insulin glargine (LANTUS SOLOSTAR) 100 UNIT/ML Solostar Pen Inject 16 Units into the skin every morning. (Patient taking differently: Inject 22 Units into the skin daily.) 30 mL 2   Insulin Pen Needle (PEN NEEDLES) 32G X 4 MM MISC 1 each by Does not apply route 4 (four) times daily. E11.9 400 each 0   ipratropium (ATROVENT) 0.06 % nasal spray PLACE 2 SPRAYS INTO BOTH NOSTRILS 4 TIMES DAILY. 45 mL 3   KLOR-CON M20 20 MEQ tablet TAKE 1 & 1/2 TABLETS BY MOUTH TWICE A DAY (Patient taking differently: 20 mEq 2 (two) times daily.) 270 tablet 2   Lancet Devices (EASY MINI EJECT LANCING DEVICE) MISC Use as directed 4 times daily to test blood sugar 1 each 3   LINZESS 290 MCG CAPS capsule TAKE 1 CAPSULE BY MOUTH DAILY BEFORE BREAKFAST. (Patient taking differently: as needed.) 30 capsule 3   metFORMIN (GLUCOPHAGE-XR) 500 MG 24 hr tablet TAKE 4 TABLETS BY MOUTH DAILY WITH BREAKFAST. 360 tablet 0   Microlet Lancets MISC 1 each by Does not apply route 2 (two) times daily. E11.9 100 each 2   pantoprazole (PROTONIX) 40 MG tablet Take 1 tablet (40 mg total) by mouth 2 (two) times daily. 180 tablet 3   telmisartan (MICARDIS) 40 MG tablet Take 1 tablet (40 mg total) by mouth daily. 90 tablet 3   venlafaxine XR  (EFFEXOR-XR) 75 MG 24 hr capsule TAKE 1 CAPSULE BY MOUTH TWICE A DAY 180 capsule 3   benzonatate (  TESSALON PERLES) 100 MG capsule Take 1-2 capsules (100-200 mg total) by mouth 3 (three) times daily as needed for cough. (Patient not taking: Reported on 08/18/2023) 30 capsule 0   chlorpheniramine-HYDROcodone (TUSSIONEX) 10-8 MG/5ML Take 5 mLs by mouth at bedtime as needed for cough (do not drive for 8 hours after taking). (Patient not taking: Reported on 08/18/2023) 115 mL 0   cyclobenzaprine (FLEXERIL) 5 MG tablet Take 1 tablet (5 mg total) by mouth 3 (three) times daily as needed for muscle spasms (or Headache). (Patient not taking: Reported on 08/18/2023) 30 tablet 0   guaiFENesin-dextromethorphan (ROBITUSSIN DM) 100-10 MG/5ML syrup Take 5 mLs by mouth every 4 (four) hours as needed for cough (chest congestion). (Patient not taking: Reported on 08/18/2023) 118 mL 0   ondansetron (ZOFRAN-ODT) 4 MG disintegrating tablet Take 1 tablet (4 mg total) by mouth every 8 (eight) hours as needed for nausea or vomiting. (Patient not taking: Reported on 08/18/2023) 20 tablet 2   oxyCODONE-acetaminophen (PERCOCET) 5-325 MG tablet Take 1 tablet by mouth every 4 (four) hours as needed for severe pain. (Patient not taking: Reported on 08/18/2023) 30 tablet 0   No current facility-administered medications for this visit.   Objective  Objective:  BP 132/76   Pulse 85   Temp 97.9 F (36.6 C)   Ht 5\' 6"  (1.676 m)   Wt 215 lb 6.4 oz (97.7 kg)   SpO2 98%   BMI 34.77 kg/m  Gen: NAD, resting comfortably Frontal sinus tenderness noted bilaterally but worse on the right.  Minimal maxillary sinus tenderness.  Nasal turbinates edematous and erythematous without significant discharge.  Pharynx with some posterior drainage CV: RRR no murmurs rubs or gallops Lungs: CTAB no crackles, wheeze, rhonchi Abdomen: soft/nontender/nondistended/normal bowel sounds. No rebound or guarding.  Ext: no edema Skin: warm, dry    Assessment  and Plan:    #Sarcoidosis of lung/asthma exacerbation leading to hospitalization -Patient with flare of sarcoidosis/asthma due to rhinovirus -She also reports was not taking Breo for hospitalization and I encouraged her to take this regularly 200-25 mcg 1 puff every day - She seems to be improving as far as breathing and cough on 1 hand but on the other hand reports needing albuterol still 2 or 3 times a day.  Was not discharged with prednisone and we opted to do 7-day course of this (prior long-term prednisone use but was removed due to osteoporosis) -She also has some significant sinus pressure and congestion for now 10 days-we will cover with Augmentin (recognizing that she just finished amoxicillin but that this would not provide adequate sinusitis coverage) -Also reports some nausea with this illness and will prescribe Zofran - she also has pulmonary follow up set up next week- I strongly encouraged her to keep this appointment for reassessment -we also considered repeat CXR to evaluate hazy opacities and if worsening could be sign of pneumonia - she prefers to trial the Augmentin first   # hypertension/hypertensive heart disease with chronic diastolic CHF-hypertension is well-controlled on Lasix 40 mg daily (also potassium 20 meq), diltiazem extended release 120 mg,  telmisartan 40 mg -Controlled. Continue current medications. -no obvious signs of fluid overload contributing to respiratory status    #Type 2 diabetes mellitus/morbid obesity-sees Dr. Roosevelt Locks - follows with endocrinology and on insulin glargine and novolog. Chronic steroids contribute to sugar dysregulation  but thankfully has been off for some time- we will be placing her on this short term but with well controlled a1c suspect she can  tolerate -also on Trulicity 4.5 mgand metformin -stable- continue current medicines Lab Results  Component Value Date   HGBA1C 7.0 (A) 07/27/2023   HGBA1C 6.9 (A) 03/21/2023   HGBA1C 8.2  (H) 12/13/2022   Recommended follow up: Return for as needed for new, worsening, persistent symptoms. Future Appointments  Date Time Provider Department Center  08/22/2023 10:30 AM [facility] None  08/23/2023  3:00 PM Mannam, Praveen, MD [facility] None  08/29/2023  9:20 AM Motwani, Komal, MD [facility] None  09/15/2023  3:50 PM [facility] MM 2 [facility]  11/03/2023  1:20 PM Shelva Majestic, MD [facility]  03/20/2024 11:45 AM [facility] ANNUAL WELLNESS VISIT 1 [facility]    Lab/Order associations:   ICD-10-CM   1. Pulmonary sarcoidosis (HCC)  D86.0     2. Severe persistent asthma without complication  J45.50     3. Bacterial sinusitis  J32.9    B96.89     4. Chronic diastolic CHF (congestive heart failure) (HCC)  I50.32     5. Type 2 diabetes mellitus with stage 3a chronic kidney disease, with long-term current use of insulin (HCC)  E11.22    N18.31    Z79.4     6. Primary hypertension  I10       Meds ordered this encounter  Medications   amoxicillin-clavulanate (AUGMENTIN) 875-125 MG tablet    Sig: Take 1 tablet by mouth 2 (two) times daily for 7 days.    Dispense:  14 tablet    Refill:  0   ondansetron (ZOFRAN-ODT) 4 MG disintegrating tablet    Sig: Take 1 tablet (4 mg total) by mouth every 8 (eight) hours as needed for nausea or vomiting.    Dispense:  20 tablet    Refill:  0   predniSONE (DELTASONE) 20 MG tablet    Sig: Take 2 pills for 3 days, 1 pill for 4 days    Dispense:  10 tablet    Refill:  0    Return precautions advised.  Tana Conch, MD

## 2023-08-18 NOTE — Patient Instructions (Addendum)
Lungs and breathing seem better but with still using albuterol 2-3x a day I am going to do another round of prednisone  On the other hand you are still feeling run down, sinus pressure, congestion- lets try Augmentin to see if this clears things up  If not feeling better in about 10 days let me know and I can order x-ray  plus lets see what Dr. Isaiah Serge says  Recommended follow up: Return for as needed for new, worsening, persistent symptoms.

## 2023-08-22 ENCOUNTER — Other Ambulatory Visit: Payer: Medicare Other

## 2023-08-23 ENCOUNTER — Ambulatory Visit (INDEPENDENT_AMBULATORY_CARE_PROVIDER_SITE_OTHER): Payer: Medicare Other | Admitting: Pulmonary Disease

## 2023-08-23 ENCOUNTER — Encounter: Payer: Self-pay | Admitting: Pulmonary Disease

## 2023-08-23 VITALS — BP 150/72 | HR 89 | Ht 66.0 in | Wt 218.8 lb

## 2023-08-23 DIAGNOSIS — J4540 Moderate persistent asthma, uncomplicated: Secondary | ICD-10-CM

## 2023-08-23 DIAGNOSIS — D860 Sarcoidosis of lung: Secondary | ICD-10-CM | POA: Diagnosis not present

## 2023-08-23 NOTE — Patient Instructions (Signed)
VISIT SUMMARY:  You came in today for a follow-up after your recent hospitalization due to a COPD and asthma exacerbation caused by a rhinovirus infection. You initially had strep throat and were treated with amoxicillin, but your condition worsened, leading to wheezing and difficulty breathing, which required hospital admission. During your hospital stay, you received IV steroids, antibiotics, and bronchodilators. You are now feeling better, regaining strength, and able to perform daily activities, although you still experience some chest discomfort and coughing.  YOUR PLAN:  -COPD/ASTHMA EXACERBATION SECONDARY TO RHINOVIRUS: This means that your chronic lung conditions (COPD and asthma) worsened due to a rhinovirus infection. You were treated in the hospital with IV steroids, antibiotics, and bronchodilators. Continue taking Breo daily and use your albuterol rescue inhaler 2-3 times a day as needed.  -STREP THROAT: This is a bacterial infection in the throat that was treated with amoxicillin. You currently have no symptoms, indicating that the infection has resolved.  -SARCOIDOSIS: This is a chronic condition where inflammatory cells grow in different parts of your body, usually the lungs. There are no acute issues at this time, so continue with your current management plan.  -SLEEP APNEA: This is a condition where you have pauses in breathing or shallow breaths while you sleep. Your previous testing showed borderline results, so no changes to your current management are needed.  INSTRUCTIONS:  Please continue with your current management plan and monitor your symptoms. If you experience any worsening of your condition or new symptoms, contact our office immediately.

## 2023-08-23 NOTE — Progress Notes (Signed)
[name]    [id number]    [birth year]  Primary Care Physician:Hunter, Aldine Contes, MD  Referring Physician: Shelva Majestic, MD [address] [place],  [place] [address]  Chief complaint: Follow up for sarcoidosis, asthma, pulmonary hypertension  HPI: 62 y.o. who has history of hypertension, asthma, chronic bronchitis, sarcoidosis, diastolic CHF, GERD, diabetes, vocal cord dysfunction.  She has history of sarcoidosis diagnosed with mediastinoscopy in 2008.  Used to follow with Dr. Sandy Salaam in [facility] with regular biannual follow-ups. She had been on high doses of prednisone. This has been slowly tapered to 5 mg a day.  Prior CT scans did not show any obvious pulmonary parenchymal involvement with sarcoidosis.  Her sarcoidosis is believed to have an airway component as well since she has recurrent attacks of bronchitis. She is also diagnosed with vocal cord dysfunction. She has taken off prednisone 21 after she had a femur fracture with no flareup of symptoms.  Last seen in pulmonary clinic in 2018 and she has not kept follow-up at [facility] since 2022  Hospitalized in August 2023 with nonproductive cough, dyspnea.  Noted to have significant wheezing and given a diagnosis COPD with exacerbation.  Tested negative for COVID though her husband was positive prior to admission.  PCCM was consulted.  She also saw ENT for evaluation of vocal cord dysfunction with fiberoptic evaluation showing normal vocal cord movement.  There is also question of enlarged PA on CT imaging however echocardiogram did not show significant right-sided dysfunction or pulmonary hypertension.  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  No mold, hot tub, Jacuzzi.  No feather pillows or comforters Smoking history: Never smoker Travel history: No significant travel  history Relevant family history: Uncle had sarcoidosis.  Interim history: Discussed the use of AI scribe software for clinical note transcription with the patient, who gave verbal consent to proceed.  The patient, with a history of COPD, asthma, bronchitis, and sarcoidosis, presents for a follow-up after a recent hospitalization in October 2024  due to a COPD and asthma exacerbation secondary to rhinovirus infection. She initially presented with strep throat and was treated with amoxicillin as an outpatient. However, her condition worsened, leading to wheezing and difficulty breathing, necessitating hospital admission. During her hospital stay, she received IV steroids, antibiotics, and bronchodilators.  Currently, she reports feeling better, regaining strength, and being able to perform daily activities. She still experiences some chest discomfort and coughing, but not as severe as before. She is on daily Breo and uses an albuterol rescue inhaler two to three times a day. She denies using CPAP, as previous testing indicated borderline results.  Hospitalization records reviewed  Outpatient Encounter Medications as of 08/23/2023  Medication Sig   albuterol (PROVENTIL) (2.5 MG/3ML) 0.083% nebulizer solution Take 2.5 mg by nebulization every 6 (six) hours as needed for wheezing or shortness of breath.   albuterol (VENTOLIN HFA) 108 (90 Base) MCG/ACT inhaler Inhale 2 puffs into the lungs every 6 (six) hours as needed for wheezing or shortness of breath.   amoxicillin-clavulanate (AUGMENTIN) 875-125 MG tablet Take 1 tablet by mouth 2 (two) times daily for 7 days.   aspirin EC 81 MG tablet Take 1 tablet (81 mg total) by mouth daily.   atorvastatin (LIPITOR) 40 MG tablet TAKE 1 TABLET BY MOUTH EVERY DAY (Patient taking differently: Take  40 mg by mouth daily.)   BREO ELLIPTA 200-25 MCG/INH AEPB TAKE 1 PUFF BY MOUTH EVERY DAY (Patient taking differently: Inhale 1 puff into the lungs daily.)   buPROPion  (WELLBUTRIN XL) 300 MG 24 hr tablet TAKE 1 TABLET BY MOUTH EVERY DAY   chlorpheniramine-HYDROcodone (TUSSIONEX) 10-8 MG/5ML Take 5 mLs by mouth at bedtime as needed for cough (do not drive for 8 hours after taking).   Continuous Glucose Sensor (DEXCOM G7 SENSOR) MISC 1 Device by Does not apply route as directed. Change sensor every 10 days   cyclobenzaprine (FLEXERIL) 5 MG tablet Take 1 tablet (5 mg total) by mouth 3 (three) times daily as needed for muscle spasms (or Headache).   diltiazem (CARDIZEM CD) 120 MG 24 hr capsule Take 1 capsule (120 mg total) by mouth daily.   Dulaglutide (TRULICITY) 4.5 MG/0.5ML SOPN Inject 4.5 mg as directed once a week. (Patient taking differently: Inject 4.5 mg as directed once a week. Mondays)   estradiol (ESTRACE) 0.1 MG/GM vaginal cream Place 0.5 Applicatorfuls vaginally 2 (two) times a week.   fenofibrate 160 MG tablet TAKE 1 TABLET BY MOUTH EVERY DAY   fluticasone (FLONASE) 50 MCG/ACT nasal spray Place 2 sprays into both nostrils daily as needed for allergies.   furosemide (LASIX) 40 MG tablet Take 1 tablet (40 mg total) by mouth daily.   gabapentin (NEURONTIN) 300 MG capsule TAKE AT NIGHT IN ADDITION TO THE 100MG  GABAPENTIN TO GET 400MG  AT NIGHT AND 100MG  IN THE MORNING AND AT MID DAY. (Patient taking differently: Take 300 mg by mouth at bedtime.)   glucose blood (CONTOUR NEXT TEST) test strip 1 each by Other route 4 (four) times daily. And lancets 4/day   guaiFENesin-dextromethorphan (ROBITUSSIN DM) 100-10 MG/5ML syrup Take 5 mLs by mouth every 4 (four) hours as needed for cough (chest congestion).   ibuprofen (ADVIL) 800 MG tablet Take 1 tablet (800 mg total) by mouth 3 (three) times daily. (Patient taking differently: Take 800 mg by mouth 3 (three) times daily as needed for moderate pain (pain score 4-6).)   insulin aspart (NOVOLOG FLEXPEN) 100 UNIT/ML FlexPen Inject 15 Units into the skin daily with supper.   insulin glargine (LANTUS SOLOSTAR) 100 UNIT/ML  Solostar Pen Inject 16 Units into the skin every morning. (Patient taking differently: Inject 22 Units into the skin daily.)   Insulin Pen Needle (PEN NEEDLES) 32G X 4 MM MISC 1 each by Does not apply route 4 (four) times daily. E11.9   ipratropium (ATROVENT) 0.06 % nasal spray PLACE 2 SPRAYS INTO BOTH NOSTRILS 4 TIMES DAILY.   KLOR-CON M20 20 MEQ tablet TAKE 1 & 1/2 TABLETS BY MOUTH TWICE A DAY (Patient taking differently: 20 mEq 2 (two) times daily.)   Lancet Devices (EASY MINI EJECT LANCING DEVICE) MISC Use as directed 4 times daily to test blood sugar   LINZESS 290 MCG CAPS capsule TAKE 1 CAPSULE BY MOUTH DAILY BEFORE BREAKFAST. (Patient taking differently: as needed.)   metFORMIN (GLUCOPHAGE-XR) 500 MG 24 hr tablet TAKE 4 TABLETS BY MOUTH DAILY WITH BREAKFAST.   Microlet Lancets MISC 1 each by Does not apply route 2 (two) times daily. E11.9   ondansetron (ZOFRAN-ODT) 4 MG disintegrating tablet Take 1 tablet (4 mg total) by mouth every 8 (eight) hours as needed for nausea or vomiting.   pantoprazole (PROTONIX) 40 MG tablet Take 1 tablet (40 mg total) by mouth 2 (two) times daily.   predniSONE (DELTASONE) 20 MG tablet Take 2 pills for 3  days, 1 pill for 4 days   telmisartan (MICARDIS) 40 MG tablet Take 1 tablet (40 mg total) by mouth daily.   venlafaxine XR (EFFEXOR-XR) 75 MG 24 hr capsule TAKE 1 CAPSULE BY MOUTH TWICE A DAY   [DISCONTINUED] mupirocin nasal ointment (BACTROBAN) 2 % Place 1 application into the nose 2 (two) times daily. Use one-half of tube in each nostril twice daily for five (5) days. After application, press sides of nose together and gently massage.   No facility-administered encounter medications on file as of 08/23/2023.    Physical Exam: Blood pressure (!) 150/72, pulse 89, height 5\' 6"  (1.676 m), weight 218 lb 12.8 oz (99.2 kg), SpO2 97%. Gen:      No acute distress HEENT:  EOMI, sclera anicteric Neck:     No masses; no thyromegaly Lungs:    Clear to auscultation  bilaterally; normal respiratory effort CV:         Regular rate and rhythm; no murmurs Abd:      + bowel sounds; soft, non-tender; no palpable masses, no distension Ext:    No edema; adequate peripheral perfusion Skin:      Warm and dry; no rash Neuro: alert and oriented x 3 Psych: normal mood and affect   Data Reviewed: Imaging CT chest (07/03/13)- No CT findings to suggest sarcoidosis. A single tiny punctate indeterminate nodule is noted. The nodule's oval configuration makes an intrapulmonary lymph node or other benign entity more likely.   CTA 06/05/2022-no evidence of pulmonary embolism, cardiomegaly, enlarged pulmonary artery.  Lungs are clear.  CT abdomen pelvis 06/16/2023-visualized lung base shows no abnormal findings.  Chest x-ray 08/09/2023-low lung volumes with bronchovascular crowding. I have reviewed the images personally.  PFTs  02/12/16 FVC 2.12 (58%), FEV1 1.75 (1%), F/F 83, TLC 75%, DLCO 65% Moderate restriction and reduction in diffusion capacity. Improved compared to 2015  10/14/2022 FVC 2.61 [75%], FEV1 2.26 [84%], F/F86, TLC 5.03 [93%], DLCO 20.21 [94%] Air trapping, bronchodilator response   Labs: CBC 06/15/22 WBC 11.2, eos 4.1%, absolute eosinophil count 459  Surgical pathology, mediastinal lymph nodes 11/07/06 Nonnecrotizing granulomas.  Cardiac: Echocardiogram 06/08/2022-LVEF 55-60%, grade 1 diastolic dysfunction.  Normal RV size and function, small pericardial effusion.  No TR noted.  Sleep: PSG 10/14/2022 Mild OSA, AHI 8.5, desats to 80%.  Assessment:  Sarcoidosis Diagnosed with mediastinoscopy in 2008.  Had been on prednisone on past but off steroids since 2021. Recent CT reviewed with no significant pulmonary involvement of sarcoidosis.  It is previously felt that she had airway involvement of sarcoidosis leading to recurrent bronchitis  Chronic bronchitis Asthma/Airway component of sarcoidosis COPD/Asthma exacerbation secondary to  Rhinovirus Hospitalized in October 2024 for COPD/Asthma exacerbation. Treated with IV steroids, antibiotics, and bronchodilators. Currently reports improvement and is regaining strength. Mild residual chest discomfort and cough.  -Continue Breo daily and Albuterol 2-3 times daily as needed.  Enlarged PA Noted on recent CT chest however echocardiogram is reassuring with normal RV size and function  OSA Sleep study reviewed with mild sleep apnea.  We reviewed treatment options including CPAP but she would like to try losing weight first Desats to 80 noted but it appears to be artifactual looking at the tracings.  Follow-up No acute issues at this time. Continue current management and monitor symptoms.   Plan/Recommendations: Continue Virgel Bouquet, albuterol as needed  Chilton Greathouse MD [facility] 08/23/2023, 3:07 PM  CC: Shelva Majestic, MD

## 2023-08-27 ENCOUNTER — Other Ambulatory Visit: Payer: Self-pay | Admitting: "Endocrinology

## 2023-08-27 ENCOUNTER — Other Ambulatory Visit: Payer: Self-pay | Admitting: Family Medicine

## 2023-08-27 ENCOUNTER — Other Ambulatory Visit: Payer: Self-pay | Admitting: Internal Medicine

## 2023-08-27 DIAGNOSIS — E1165 Type 2 diabetes mellitus with hyperglycemia: Secondary | ICD-10-CM

## 2023-08-29 ENCOUNTER — Ambulatory Visit: Payer: Medicare Other | Admitting: "Endocrinology

## 2023-08-29 ENCOUNTER — Ambulatory Visit (INDEPENDENT_AMBULATORY_CARE_PROVIDER_SITE_OTHER): Payer: Medicare Other | Admitting: Family Medicine

## 2023-08-29 ENCOUNTER — Encounter: Payer: Self-pay | Admitting: Family Medicine

## 2023-08-29 VITALS — BP 124/64 | HR 83 | Temp 97.2°F | Ht 66.0 in | Wt 218.0 lb

## 2023-08-29 DIAGNOSIS — R350 Frequency of micturition: Secondary | ICD-10-CM | POA: Diagnosis not present

## 2023-08-29 DIAGNOSIS — B9689 Other specified bacterial agents as the cause of diseases classified elsewhere: Secondary | ICD-10-CM

## 2023-08-29 DIAGNOSIS — I10 Essential (primary) hypertension: Secondary | ICD-10-CM | POA: Diagnosis not present

## 2023-08-29 DIAGNOSIS — I5032 Chronic diastolic (congestive) heart failure: Secondary | ICD-10-CM

## 2023-08-29 DIAGNOSIS — R300 Dysuria: Secondary | ICD-10-CM

## 2023-08-29 DIAGNOSIS — J329 Chronic sinusitis, unspecified: Secondary | ICD-10-CM | POA: Diagnosis not present

## 2023-08-29 LAB — POC URINALSYSI DIPSTICK (AUTOMATED)
Bilirubin, UA: NEGATIVE
Blood, UA: NEGATIVE
Glucose, UA: NEGATIVE
Ketones, UA: NEGATIVE
Leukocytes, UA: NEGATIVE
Nitrite, UA: NEGATIVE
Protein, UA: NEGATIVE
Spec Grav, UA: 1.02 (ref 1.010–1.025)
Urobilinogen, UA: 0.2 U/dL
pH, UA: 5 (ref 5.0–8.0)

## 2023-08-29 MED ORDER — AMOXICILLIN-POT CLAVULANATE 875-125 MG PO TABS: 1.0000 | ORAL_TABLET | Freq: Two times a day (BID) | ORAL | 0 refills | Status: AC

## 2023-08-29 NOTE — Telephone Encounter (Signed)
 Pt has not been seen by Dr. Denyse Amass in over a year. Rx refill not appropriate.

## 2023-08-29 NOTE — Patient Instructions (Addendum)
I am concerned this is still sinusitis that never fully resolved- since you were improving though we opted to trial 14 day course of Augmentin. If this does not resolve the issue may need sinus CT - with how tender you are in sinuses I think that's the most likely cause and we can hold off on neuroimaging for now . Please keep me updated - if symptoms worsen at any point let me know sooner than 2 weeks  You may unfortunately need a different antibiotic depending on how cultures come back on urine.   Recommended follow up: Return for next already scheduled visit or sooner if needed.

## 2023-08-29 NOTE — Progress Notes (Signed)
Phone [phone] In person visit   Subjective:   [name] is a 62 y.o. year old very pleasant female patient who presents for/with See problem oriented charting Chief Complaint  Patient presents with   Headache    Pt states she is having headaches that started a week ago starting at the front of her head radiating to the back of her head.   Urinary Frequency    Pt c/o urinary frequency that started 2 days ago.   Past Medical History-  Patient Active Problem List   Diagnosis Date Noted   Severe persistent asthma 01/04/2019    Priority: High   Subcortical microvascular ischemic occlusive disease 07/12/2018    Priority: High   Chronic diastolic CHF (congestive heart failure) (HCC) 08/13/2017    Priority: High   Pulmonary sarcoidosis (HCC)     Priority: High   Chronic bronchitis (HCC) in never smoker 09/18/2013    Priority: High   Chest pain 04/11/2013    Priority: High   Hypertensive heart disease with chronic diastolic congestive heart failure (HCC)     Priority: High   Sarcoidosis of lung (HCC) 04/10/2007    Priority: High   Type II diabetes mellitus with renal manifestations (HCC) 08/21/2006    Priority: High   Iron deficiency anemia 10/01/2020    Priority: Medium    Hypertriglyceridemia 04/02/2020    Priority: Medium    Aortic atherosclerosis (HCC) 02/14/2020    Priority: Medium    Constipation 04/21/2018    Priority: Medium    Epiglottitis     Priority: Medium    Osteoarthritis of right knee 01/26/2017    Priority: Medium    CKD (chronic kidney disease) stage 3, GFR 30-59 ml/min (HCC) 04/27/2015    Priority: Medium    History of osteomyelitis 03/27/2015    Priority: Medium    Fatty liver 09/30/2014    Priority: Medium    Major depression in full remission (HCC)     Priority: Medium    Gout 08/20/2010    Priority: Medium    Deficiency anemia 09/18/2009    Priority: Medium    Sleep apnea 04/21/2009    Priority: Medium    Hyperlipidemia  associated with type 2 diabetes mellitus (HCC) 08/21/2006    Priority: Medium    Hypertension 08/21/2006    Priority: Medium    Nontraumatic complete tear of left rotator cuff     Priority: Low   Diabetic neuropathy (HCC) 02/20/2019    Priority: Low   Rapid palpitations 07/12/2018    Priority: Low   Impingement of left ankle joint 06/26/2018    Priority: Low   Total knee replacement status, left 12/07/2017    Priority: Low   Dysphagia 09/20/2017    Priority: Low   Plantar fasciitis, right 07/13/2017    Priority: Low   S/P total knee arthroplasty, right 06/29/2017    Priority: Low   Osteoarthrosis, localized, primary, knee, right     Priority: Low   Sprain of calcaneofibular ligament of right ankle 06/06/2017    Priority: Low   Onychomycosis 10/27/2015    Priority: Low   MRSA (methicillin resistant staph aureus) culture positive 03/27/2015    Priority: Low   Hot flashes 07/15/2014    Priority: Low   Abnormal SPEP 04/17/2014    Priority: Low   Fracture of left leg 04/17/2014    Priority: Low   Internal hemorrhoids     Priority: Low   Preoperative clearance 03/25/2014    Priority:  Low   Solitary pulmonary nodule, on CT 02/2013 - stable over 2 years in 2015 02/20/2013    Priority: Low   GERD 08/21/2006    Priority: Low   Chronic pain of left wrist 04/19/2022    Priority: 1.   Facet arthropathy, cervical 06/29/2021    Priority: 1.   Foraminal stenosis of cervical region 06/29/2021    Priority: 1.   COPD with acute exacerbation (HCC) 08/09/2023   Preop cardiovascular exam 12/07/2021   Postural dizziness with presyncope 09/30/2020    Medications- reviewed and updated Current Outpatient Medications  Medication Sig Dispense Refill   albuterol (PROVENTIL) (2.5 MG/3ML) 0.083% nebulizer solution Take 2.5 mg by nebulization every 6 (six) hours as needed for wheezing or shortness of breath.     albuterol (VENTOLIN HFA) 108 (90 Base) MCG/ACT inhaler Inhale 2 puffs into the  lungs every 6 (six) hours as needed for wheezing or shortness of breath. 8 g 0   aspirin EC 81 MG tablet Take 1 tablet (81 mg total) by mouth daily. 30 tablet 11   atorvastatin (LIPITOR) 40 MG tablet TAKE 1 TABLET BY MOUTH EVERY DAY (Patient taking differently: Take 40 mg by mouth daily.) 90 tablet 3   BREO ELLIPTA 200-25 MCG/INH AEPB TAKE 1 PUFF BY MOUTH EVERY DAY (Patient taking differently: Inhale 1 puff into the lungs daily.) 60 each 5   buPROPion (WELLBUTRIN XL) 300 MG 24 hr tablet TAKE 1 TABLET BY MOUTH EVERY DAY 90 tablet 3   chlorpheniramine-HYDROcodone (TUSSIONEX) 10-8 MG/5ML Take 5 mLs by mouth at bedtime as needed for cough (do not drive for 8 hours after taking). 115 mL 0   Continuous Glucose Sensor (DEXCOM G7 SENSOR) MISC 1 Device by Does not apply route as directed. Change sensor every 10 days 3 each 3   cyclobenzaprine (FLEXERIL) 5 MG tablet Take 1 tablet (5 mg total) by mouth 3 (three) times daily as needed for muscle spasms (or Headache). 30 tablet 0   diltiazem (CARDIZEM CD) 120 MG 24 hr capsule Take 1 capsule (120 mg total) by mouth daily. 90 capsule 3   Dulaglutide (TRULICITY) 4.5 MG/0.5ML SOPN Inject 4.5 mg as directed once a week. (Patient taking differently: Inject 4.5 mg as directed once a week. Mondays) 6 mL 3   estradiol (ESTRACE) 0.1 MG/GM vaginal cream Place 0.5 Applicatorfuls vaginally 2 (two) times a week.     fenofibrate 160 MG tablet TAKE 1 TABLET BY MOUTH EVERY DAY 90 tablet 1   fluticasone (FLONASE) 50 MCG/ACT nasal spray Place 2 sprays into both nostrils daily as needed for allergies. 48 mL 3   furosemide (LASIX) 40 MG tablet Take 1 tablet (40 mg total) by mouth daily.     gabapentin (NEURONTIN) 300 MG capsule TAKE AT NIGHT IN ADDITION TO THE 100MG  GABAPENTIN TO GET 400MG  AT NIGHT AND 100MG  IN THE MORNING AND AT MID DAY. (Patient taking differently: Take 300 mg by mouth at bedtime.) 90 capsule 0   glucose blood (CONTOUR NEXT TEST) test strip 1 each by Other route 4  (four) times daily. And lancets 4/day 400 each 3   ibuprofen (ADVIL) 800 MG tablet Take 1 tablet (800 mg total) by mouth 3 (three) times daily. (Patient taking differently: Take 800 mg by mouth 3 (three) times daily as needed for moderate pain (pain score 4-6).) 60 tablet 1   insulin aspart (NOVOLOG FLEXPEN) 100 UNIT/ML FlexPen Inject 15 Units into the skin daily with supper. 15 mL 11   insulin  glargine (LANTUS SOLOSTAR) 100 UNIT/ML Solostar Pen Inject 16 Units into the skin every morning. (Patient taking differently: Inject 22 Units into the skin daily.) 30 mL 2   Insulin Pen Needle (PEN NEEDLES) 32G X 4 MM MISC 1 each by Does not apply route 4 (four) times daily. E11.9 400 each 0   ipratropium (ATROVENT) 0.06 % nasal spray PLACE 2 SPRAYS INTO BOTH NOSTRILS 4 TIMES DAILY. 45 mL 3   KLOR-CON M20 20 MEQ tablet TAKE 1 & 1/2 TABLETS BY MOUTH TWICE A DAY (Patient taking differently: 20 mEq 2 (two) times daily.) 270 tablet 2   Lancet Devices (EASY MINI EJECT LANCING DEVICE) MISC Use as directed 4 times daily to test blood sugar 1 each 3   LINZESS 290 MCG CAPS capsule TAKE 1 CAPSULE BY MOUTH DAILY BEFORE BREAKFAST. (Patient taking differently: as needed.) 30 capsule 3   metFORMIN (GLUCOPHAGE-XR) 500 MG 24 hr tablet TAKE 4 TABLETS BY MOUTH DAILY WITH BREAKFAST. 360 tablet 0   Microlet Lancets MISC 1 each by Does not apply route 2 (two) times daily. E11.9 100 each 2   ondansetron (ZOFRAN-ODT) 4 MG disintegrating tablet Take 1 tablet (4 mg total) by mouth every 8 (eight) hours as needed for nausea or vomiting. 20 tablet 0   pantoprazole (PROTONIX) 40 MG tablet TAKE 1 TABLET BY MOUTH TWICE A DAY 180 tablet 1   telmisartan (MICARDIS) 40 MG tablet Take 1 tablet (40 mg total) by mouth daily. 90 tablet 3   venlafaxine XR (EFFEXOR-XR) 75 MG 24 hr capsule TAKE 1 CAPSULE BY MOUTH TWICE A DAY 180 capsule 3   guaiFENesin-dextromethorphan (ROBITUSSIN DM) 100-10 MG/5ML syrup Take 5 mLs by mouth every 4 (four) hours as  needed for cough (chest congestion). (Patient not taking: Reported on 08/29/2023) 118 mL 0   No current facility-administered medications for this visit.     Objective:  BP 124/64   Pulse 83   Temp (!) 97.2 F (36.2 C)   Ht 5\' 6"  (1.676 m)   Wt 218 lb (98.9 kg)   SpO2 97%   BMI 35.19 kg/m  Gen: NAD, resting comfortably Is very tender in maxillary and frontal sinuses and reports some radiation of pain into the back of her head.  Nasal turbinates without discharge-mildly edematous.  Oropharynx largely normal.  No cough during visit. CV: RRR no murmurs rubs or gallops Lungs: CTAB no crackles, wheeze, rhonchi Ext: no edema Skin: warm, dry   Results for orders placed or performed in visit on 08/29/23 (from the past 24 hour(s))  POCT Urinalysis Dipstick (Automated)     Status: None   Collection Time: 08/29/23  3:21 PM  Result Value Ref Range   Color, UA yellow    Clarity, UA clear    Glucose, UA Negative Negative   Bilirubin, UA neg    Ketones, UA neg    Spec Grav, UA 1.020 1.010 - 1.025   Blood, UA neg    pH, UA 5.0 5.0 - 8.0   Protein, UA Negative Negative   Urobilinogen, UA 0.2 0.2 or 1.0 E.U./dL   Nitrite, UA neg    Leukocytes, UA Negative Negative   *Note: Due to a large number of results and/or encounters for the requested time period, some results have not been displayed. A complete set of results can be found in Results Review.       Assessment and Plan   # Frontal and maxillary sinus tenderness concerning for sinusitis S: Patient was seen  11 days ago hospital follow-up from flare of sarcoidosis/asthma due to rhinovirus.  Breathing and cough were somewhat better but was still using albuterol 2 or 3 times a day so we opted to prescribe prednisone - She also has significant sinus pressure and congestion for 10 days and due to concern of secondary bacterial sinusitis we treated with Augmentin for 7 days.  Was done with some nausea will prescribe Zofran -There had been  some hazy opacities on chest x-ray and we offered chest x-ray but she wanted to trial antibiotics first  Today she reports having headaches starting a day or two after finishing antibiotics- prior headache had resolved before the new one started. Congestion improved but never resolved- still felt off but did improve. Albuterol down to once a day- had been up to 2-3 x a day last visit. Breathing is much better. Is doing breo regularly now  Headaches start in front of her head radiating  to the back of her head and also hurt in maxillary sinuses. Congestion has worsened again after initially improving. Breathing still ok.  A/P: Prior respiratory illness appears to have fully resolved.  On the other hand-I am concerned this is still sinusitis that never fully resolved- since you were improving though we opted to trial 14 day course of Augmentin. If this does not resolve the issue may need sinus CT - with how tender you are in sinuses I think that's the most likely cause and we can hold off on neuroimaging for now . Please keep me updated - if symptoms worsen at any point let me know sooner than 2 weeks  #Concern for UTI S: Patients symptoms started 2 days ago.  Complains of dysuria: mild; polyuria: yes; nocturia: increased; urgency: none.  Symptoms are about the same.  ROS- no fever, chills, nausea, vomiting,No blood in urine. Some flank pain A/P: UA without clear urinary tract infection. Likely UTI. Will get culture. Empiric treatment- holding off other than treatment for sinusitis-Augmentin may have potential benefit Patient to follow up if new or worsening symptoms or failure to improve.   # hypertension/hypertensive heart disease with chronic diastolic CHF S: compliant with Lasix 40 mg daily (also potassium 20 meq), diltiazem extended release 120 mg,  telmisartan 20 mg--> 40 mg on 12/10/22 A/P: Blood pressure appears well-controlled-continue current medication CHF appears euvolemic-continue current  medication.  For urinary symptoms typically would recommend increasing hydration-somewhat more challenging with her CHF-she is since he is going to hold steady and wait on results of urine culture or mildly increase fluids short-term  Recommended follow up: Return for next already scheduled visit or sooner if needed. Future Appointments  Date Time Provider Department Center  09/30/2023 11:30 AM [facility] 3 [facility]  11/03/2023  1:20 PM Shelva Majestic, MD [facility]  02/20/2024 11:00 AM Parrett, Virgel Bouquet, NP [facility] None  03/20/2024 11:45 AM [facility] ANNUAL WELLNESS VISIT 1 [facility]    Lab/Order associations:   ICD-10-CM   1. Urinary frequency  R35.0 POCT Urinalysis Dipstick (Automated)    Urine Culture    2. Dysuria  R30.0 Urine Culture    3. Bacterial sinusitis  J32.9    B96.89     4. Primary hypertension  I10     5. Chronic diastolic CHF (congestive heart failure) (HCC)  I50.32       Meds ordered this encounter  Medications   amoxicillin-clavulanate (AUGMENTIN) 875-125 MG tablet    Sig: Take 1 tablet by mouth 2 (two)  times daily for 14 days.    Dispense:  28 tablet    Refill:  0    Return precautions advised.  Tana Conch, MD

## 2023-08-29 NOTE — Telephone Encounter (Signed)
Metformin refill request complete

## 2023-08-30 ENCOUNTER — Other Ambulatory Visit: Payer: Self-pay | Admitting: Family Medicine

## 2023-08-30 LAB — URINE CULTURE
MICRO NUMBER:: 15743875
Result:: NO GROWTH
SPECIMEN QUALITY:: ADEQUATE

## 2023-09-06 ENCOUNTER — Other Ambulatory Visit: Payer: Self-pay | Admitting: Family Medicine

## 2023-09-13 ENCOUNTER — Encounter: Payer: Self-pay | Admitting: Family Medicine

## 2023-09-15 ENCOUNTER — Ambulatory Visit: Payer: Medicare Other

## 2023-09-16 ENCOUNTER — Other Ambulatory Visit: Payer: Self-pay

## 2023-09-16 DIAGNOSIS — R059 Cough, unspecified: Secondary | ICD-10-CM

## 2023-09-16 MED ORDER — BENZONATATE 100 MG PO CAPS: 100.0000 mg | ORAL_CAPSULE | Freq: Three times a day (TID) | ORAL | 3 refills | Status: DC | PRN

## 2023-09-17 ENCOUNTER — Other Ambulatory Visit: Payer: Self-pay | Admitting: Family Medicine

## 2023-09-29 DIAGNOSIS — M79642 Pain in left hand: Secondary | ICD-10-CM | POA: Diagnosis not present

## 2023-09-30 ENCOUNTER — Ambulatory Visit: Payer: Medicare Other

## 2023-10-01 ENCOUNTER — Encounter: Payer: Self-pay | Admitting: "Endocrinology

## 2023-10-03 ENCOUNTER — Other Ambulatory Visit: Payer: Self-pay | Admitting: "Endocrinology

## 2023-10-03 ENCOUNTER — Other Ambulatory Visit: Payer: Self-pay

## 2023-10-03 DIAGNOSIS — Z794 Long term (current) use of insulin: Secondary | ICD-10-CM

## 2023-10-03 MED ORDER — MICROLET LANCETS MISC: 1.0000 | Freq: Two times a day (BID) | 2 refills | Status: AC

## 2023-10-03 MED ORDER — LANTUS SOLOSTAR 100 UNIT/ML ~~LOC~~ SOPN: 16.0000 [IU] | PEN_INJECTOR | SUBCUTANEOUS | 2 refills | Status: DC

## 2023-10-07 ENCOUNTER — Other Ambulatory Visit (HOSPITAL_COMMUNITY): Payer: Self-pay

## 2023-10-07 ENCOUNTER — Encounter: Payer: Self-pay | Admitting: Family Medicine

## 2023-10-07 NOTE — Telephone Encounter (Signed)
Brand Lantus Solostar is covered.

## 2023-10-09 ENCOUNTER — Other Ambulatory Visit: Payer: Self-pay | Admitting: Cardiology

## 2023-10-10 ENCOUNTER — Other Ambulatory Visit (HOSPITAL_COMMUNITY): Payer: Self-pay

## 2023-10-10 ENCOUNTER — Ambulatory Visit
Admission: RE | Admit: 2023-10-10 | Discharge: 2023-10-10 | Disposition: A | Discharge status: Home / Self Care | Payer: Medicare Other | Source: Ambulatory Visit | Attending: Family Medicine | Admitting: Family Medicine

## 2023-10-10 DIAGNOSIS — Z1231 Encounter for screening mammogram for malignant neoplasm of breast: Secondary | ICD-10-CM | POA: Diagnosis not present

## 2023-10-10 DIAGNOSIS — Z0000 Encounter for general adult medical examination without abnormal findings: Secondary | ICD-10-CM

## 2023-10-10 NOTE — Telephone Encounter (Signed)
Brand Lantus covered under a courtesy fill but is still $105.00- billing for Semglee pen shows copay of $0.00. Can a new script be sent or has the patient previously failed/had a contraindication to this preferred alternative?

## 2023-10-11 ENCOUNTER — Encounter: Payer: Self-pay | Admitting: Family

## 2023-10-11 ENCOUNTER — Ambulatory Visit: Payer: Medicare Other | Admitting: Family

## 2023-10-11 VITALS — BP 139/78 | HR 74 | Temp 97.8°F | Ht 66.0 in | Wt 218.0 lb

## 2023-10-11 DIAGNOSIS — B349 Viral infection, unspecified: Secondary | ICD-10-CM | POA: Diagnosis not present

## 2023-10-11 DIAGNOSIS — J4551 Severe persistent asthma with (acute) exacerbation: Secondary | ICD-10-CM

## 2023-10-11 DIAGNOSIS — L309 Dermatitis, unspecified: Secondary | ICD-10-CM

## 2023-10-11 DIAGNOSIS — R6889 Other general symptoms and signs: Secondary | ICD-10-CM

## 2023-10-11 DIAGNOSIS — J9801 Acute bronchospasm: Secondary | ICD-10-CM | POA: Diagnosis not present

## 2023-10-11 LAB — POCT INFLUENZA A/B
Influenza A, POC: NEGATIVE
Influenza B, POC: NEGATIVE

## 2023-10-11 LAB — POC COVID19 BINAXNOW: SARS Coronavirus 2 Ag: NEGATIVE

## 2023-10-11 MED ORDER — ALBUTEROL SULFATE (2.5 MG/3ML) 0.083% IN NEBU: 2.5000 mg | INHALATION_SOLUTION | Freq: Four times a day (QID) | RESPIRATORY_TRACT | 2 refills | Status: DC | PRN

## 2023-10-11 NOTE — Progress Notes (Signed)
 Patient ID: [name], female    DOB: [birth year], 62 y.o.   MRN: [id number]  Chief Complaint  Patient presents with   Cough    Pt c/o cough, headache, sore throat present since 12/26.  Exposure to covid on 12/28. Has tried Augmentin  which did not help sx an mucinex - DM.    Discussed the use of AI scribe software for clinical note transcription with the patient, who gave verbal consent to proceed.  History of Present Illness   The patient, with a history of Sarcoidosis and a previous COVID-19 infection, presents with symptoms suggestive of a possible COVID-19 reinfection. She was exposed to a friend who tested positive for COVID-19 on the 28th of December. Since then, she has been feeling unwell but has not yet done a home test. She has been managing her symptoms with Mucinex  D and 2 days worth of left over Augmentin , and has not reported any fever. The patient also has a history of lung disease for which she uses Breo and an albuterol  inhaler prn. She has a nebulizer, but has not used it for her current symptoms.     Assessment & Plan:     Persistent cough w/SOB - Recent exposure to confirmed covid case. Currently symptomatic with cough and wheezing. In office rapid covid and flu testing negative. Exacerbation of asthma symptoms with current illness. Currently using Breo qd and has nebulizer at home. Discussed importance of using nebulizer during illness to improve lung function. -Use nebulizer treatments tid, especially during current illness, then prn. -Continue Breo as prescribed. -Sending refill of albuterol  for nebulizer at home. -Advised on increasing water  intake to 2L daily. -Increase Flonase  nasal spray, 1 squirt each side bid until sx are improved. -RTO precautions provided if not better by Friday.  Ear dermatitis -  Noted dry skin on outer pinna of both ears during exam. Discussed use of hydrocortisone  cream for itching and thick cream for general dryness. -Apply  hydrocortisone  cream if itching. -Apply thick cream for general dryness.     Subjective:    Outpatient Medications Prior to Visit  Medication Sig Dispense Refill   albuterol  (PROVENTIL ) (2.5 MG/3ML) 0.083% nebulizer solution Take 2.5 mg by nebulization every 6 (six) hours as needed for wheezing or shortness of breath.     albuterol  (VENTOLIN  HFA) 108 (90 Base) MCG/ACT inhaler TAKE 2 PUFFS BY MOUTH EVERY 6 HOURS AS NEEDED FOR WHEEZE OR SHORTNESS OF BREATH 8.5 each 1   aspirin  EC 81 MG tablet Take 1 tablet (81 mg total) by mouth daily. 30 tablet 11   atorvastatin  (LIPITOR) 40 MG tablet Take 1 tablet (40 mg total) by mouth daily. 90 tablet 3   benzonatate  (TESSALON  PERLES) 100 MG capsule Take 1-2 capsules (100-200 mg total) by mouth 3 (three) times daily as needed for cough. 30 capsule 3   BREO ELLIPTA  200-25 MCG/INH AEPB TAKE 1 PUFF BY MOUTH EVERY DAY (Patient taking differently: Inhale 1 puff into the lungs daily.) 60 each 5   buPROPion  (WELLBUTRIN  XL) 300 MG 24 hr tablet TAKE 1 TABLET BY MOUTH EVERY DAY 90 tablet 3   chlorpheniramine -HYDROcodone  (TUSSIONEX) 10-8 MG/5ML Take 5 mLs by mouth at bedtime as needed for cough (do not drive for 8 hours after taking). 115 mL 0   Continuous Glucose Sensor (DEXCOM G7 SENSOR) MISC 1 Device by Does not apply route as directed. Change sensor every 10 days 3 each 3   cyclobenzaprine  (FLEXERIL ) 5 MG tablet Take 1 tablet (5  mg total) by mouth 3 (three) times daily as needed for muscle spasms (or Headache). 30 tablet 0   diltiazem  (CARDIZEM  CD) 120 MG 24 hr capsule Take 1 capsule (120 mg total) by mouth daily. 90 capsule 3   Dulaglutide  (TRULICITY ) 4.5 MG/0.5ML SOPN Inject 4.5 mg as directed once a week. (Patient taking differently: Inject 4.5 mg as directed once a week. Mondays) 6 mL 3   estradiol  (ESTRACE ) 0.1 MG/GM vaginal cream Place 0.5 Applicatorfuls vaginally 2 (two) times a week.     fenofibrate  160 MG tablet TAKE 1 TABLET BY MOUTH EVERY DAY 90 tablet 1    fluticasone  (FLONASE ) 50 MCG/ACT nasal spray Place 2 sprays into both nostrils daily as needed for allergies. 48 mL 3   furosemide  (LASIX ) 40 MG tablet Take 1 tablet (40 mg total) by mouth daily.     gabapentin  (NEURONTIN ) 100 MG capsule TAKE 1 CAPSULE BY MOUTH THREE TIMES A DAY 270 capsule 3   glucose blood (CONTOUR NEXT TEST) test strip 1 each by Other route 4 (four) times daily. And lancets 4/day 400 each 3   guaiFENesin -dextromethorphan  (ROBITUSSIN DM) 100-10 MG/5ML syrup Take 5 mLs by mouth every 4 (four) hours as needed for cough (chest congestion). 118 mL 0   ibuprofen  (ADVIL ) 800 MG tablet Take 1 tablet (800 mg total) by mouth 3 (three) times daily. (Patient taking differently: Take 800 mg by mouth 3 (three) times daily as needed for moderate pain (pain score 4-6).) 60 tablet 1   insulin  aspart (NOVOLOG  FLEXPEN) 100 UNIT/ML FlexPen Inject 15 Units into the skin daily with supper. 15 mL 11   insulin  glargine (LANTUS  SOLOSTAR) 100 UNIT/ML Solostar Pen Inject 16 Units into the skin every morning. 30 mL 2   Insulin  Pen Needle (PEN NEEDLES) 32G X 4 MM MISC 1 each by Does not apply route 4 (four) times daily. E11.9 400 each 0   ipratropium (ATROVENT ) 0.06 % nasal spray PLACE 2 SPRAYS INTO BOTH NOSTRILS 4 TIMES DAILY. 45 mL 3   KLOR-CON  M20 20 MEQ tablet TAKE 1 & 1/2 TABLETS BY MOUTH TWICE A DAY (Patient taking differently: 20 mEq 2 (two) times daily.) 270 tablet 2   Lancet Devices (EASY MINI EJECT LANCING DEVICE) MISC Use as directed 4 times daily to test blood sugar 1 each 3   LINZESS  290 MCG CAPS capsule TAKE 1 CAPSULE BY MOUTH DAILY BEFORE BREAKFAST. (Patient taking differently: as needed.) 30 capsule 3   metFORMIN  (GLUCOPHAGE -XR) 500 MG 24 hr tablet TAKE 4 TABLETS BY MOUTH DAILY WITH BREAKFAST. 360 tablet 0   Microlet Lancets MISC 1 each by Does not apply route 2 (two) times daily. E11.9 100 each 2   ondansetron  (ZOFRAN -ODT) 4 MG disintegrating tablet Take 1 tablet (4 mg total) by mouth every 8  (eight) hours as needed for nausea or vomiting. 20 tablet 0   pantoprazole  (PROTONIX ) 40 MG tablet TAKE 1 TABLET BY MOUTH TWICE A DAY 180 tablet 1   telmisartan  (MICARDIS ) 40 MG tablet Take 1 tablet (40 mg total) by mouth daily. 90 tablet 3   venlafaxine  XR (EFFEXOR -XR) 75 MG 24 hr capsule TAKE 1 CAPSULE BY MOUTH TWICE A DAY 180 capsule 3   gabapentin  (NEURONTIN ) 300 MG capsule TAKE AT NIGHT IN ADDITION TO THE 100MG  GABAPENTIN  TO GET 400MG  AT NIGHT AND 100MG  IN THE MORNING AND AT MID DAY. (Patient taking differently: Take 300 mg by mouth at bedtime.) 90 capsule 0   No facility-administered medications prior to visit.  Past Medical History:  Diagnosis Date   Abnormal SPEP 04/17/2014   Acute left ankle pain 01/26/2017   ANEMIA-UNSPECIFIED 09/18/2009   Anxiety    Arthritis    Blood transfusion without reported diagnosis    Cataract    CHF (congestive heart failure) (HCC)    Chronic diastolic heart failure, NYHA class 2 (HCC)    Normal LVEDP by May 2018   COPD (chronic obstructive pulmonary disease) (HCC)    Depression    DIABETES MELLITUS, TYPE II 08/21/2006   Diabetic osteomyelitis (HCC) 05/29/2015   Fatty liver    Fracture of 5th metatarsal    non union   GERD 08/21/2006   Had fundoplication   GOUT 08/20/2010   Hammer toe of right foot    2-5th toes   Hx of umbilical hernia repair    HYPERLIPIDEMIA 08/21/2006   HYPERTENSION 08/21/2006   Infection of wound due to methicillin resistant Staphylococcus aureus (MRSA)    Internal hemorrhoids    Kidney problem    Multiple allergies 10/14/2016   OBESITY 06/04/2009   Onychomycosis 10/27/2015   Osteomyelitis of left foot (HCC) 05/29/2015   Osteoporosis    Pulmonary sarcoidosis (HCC)    Followed locally by pulmonology, but also by [facility]. Lavella at [facility]   Right knee pain 01/26/2017   Vocal cord dysfunction    Wears partial dentures    Past Surgical History:  Procedure Laterality Date   ABDOMINAL HYSTERECTOMY      APPENDECTOMY     BLADDER SUSPENSION  11/11/2011   Procedure: TRANSVAGINAL TAPE (TVT) PROCEDURE;  Surgeon: Oneil FORBES Piety, MD;  Location: [facility];  Service: Gynecology;  Laterality: N/A;   CAROTIDS  02/18/2011   CAROTID DUPLEX; VERTEBRALS ARE PATENT WITH ANTEGRADE FLOW. ICA/CCA RATIO 1.61 ON RIGHT AND 0.75 ON LEFT   CATARACT EXTRACTION, BILATERAL     CHOLECYSTECTOMY  1984   COLONOSCOPY  04/29/2010   [facility]   CYSTOSCOPY  11/11/2011   Procedure: CYSTOSCOPY;  Surgeon: Oneil FORBES Piety, MD;  Location: [facility];  Service: Gynecology;  Laterality: N/A;   EXTUBATION (ENDOTRACHEAL) IN OR N/A 09/23/2017   Procedure: EXTUBATION (ENDOTRACHEAL) IN OR;  Surgeon: Terri Alan PARAS, MD;  Location: [facility];  Service: ENT;  Laterality: N/A;   FIBEROPTIC LARYNGOSCOPY AND TRACHEOSCOPY N/A 09/23/2017   Procedure: FLEXIBLE FIBEROPTIC LARYNGOSCOPY;  Surgeon: Terri Alan PARAS, MD;  Location: [facility];  Service: ENT;  Laterality: N/A;   FRACTURE SURGERY     foot   HALLUX FUSION Left 10/02/2018   Procedure: HALLUX ARTHRODESIS;  Surgeon: Janit Thresa HERO, DPM;  Location: [facility];  Service: Podiatry;  Laterality: Left;   HAMMER TOE SURGERY Left 10/02/2018   Procedure: HAMMER TOE CORRECTION 2ND 3RD 4RD FIFTH TOE;  Surgeon: Janit Thresa HERO, DPM;  Location: [facility];  Service: Podiatry;  Laterality: Left;   HAMMER TOE SURGERY Right 04/12/2019   Procedure: HAMMER TOE CORRECTION, 2ND, 3RD, 4TH AND 5TH TOES OF RIGHT FOOT;  Surgeon: Janit Thresa HERO, DPM;  Location: [facility];  Service: Podiatry;  Laterality: Right;   HAMMER TOE SURGERY Left 10/25/2019   Procedure: HAMMER TOE REPAIR SECOND, THIRD, FOUTH;  Surgeon: Janit Thresa HERO, DPM;  Location: [facility];  Service: Podiatry;  Laterality: Left;   HERNIA REPAIR     I & D EXTREMITY Left 06/27/2015   Procedure: Partial Excision Left Calcaneus, Place Antibiotic Beads, and Wound VAC;  Surgeon: Jerona Harden GAILS, MD;  Location: [facility];  Service: Orthopedics;  Laterality: Left;  JOINT REPLACEMENT      KNEE ARTHROSCOPY     right   LEFT AND RIGHT HEART CATHETERIZATION WITH CORONARY ANGIOGRAM N/A 04/23/2013   Procedure: LEFT AND RIGHT HEART CATHETERIZATION WITH CORONARY ANGIOGRAM;  Surgeon: Alm LELON Clay, MD;  Location: [facility] CATH LAB;  Service: Cardiovascular;  Laterality: N/A;   LEFT HEART CATH AND CORONARY ANGIOGRAPHY N/A 03/11/2017   Procedure: Left Heart Cath and Coronary Angiography;  Surgeon: Wonda Sharper, MD;  Location: [facility] INVASIVE CV LAB; angiographically minimal CAD in the LAD otherwise normal.;  Normal LVEDP.  FALSE POSITIVE MYOVIEW    LEFT HEART CATH AND CORONARY ANGIOGRAPHY  07/20/2010   LVEF 50-55% WITH VERY MILD GLOBAL HYPOKINESIA; ESSENTIALLY NORMAL CORONARY ARTERIES; NORMAL LV FUNCTION   METATARSAL OSTEOTOMY WITH OPEN REDUCTION INTERNAL FIXATION (ORIF) METATARSAL WITH FUSION Left 04/09/2014   Procedure: LEFT FOOT FRACTURE OPEN TREATMENT METATARSAL INCLUDES INTERNAL FIXATION EACH;  Surgeon: Lamar DELENA Millman, MD;  Location: [facility];  Service: Orthopedics;  Laterality: Left;   NISSEN FUNDOPLICATION  2004   NM MYOVIEW  LTD --> FALSE POSITIVE  03/10/2017   : Moderate size stress-induced perfusion defect at the apex as well as ill-defined stress-induced perfusion defect in the lateral wall.  EF 55%.  INTERMEDIATE risk. -->  FALSE POSITIVE   ORIF DISTAL FEMUR FRACTURE  08/08/2020   [facility]: ORIF of left extra-articular periprosthetic distal femur fracture with synthesis of the femur plate with cortical nonlocking screws.  (Thought to be pathologic fracture due to osteoporosis) -> after a fall   ORIF FEMUR W/ PERI-IMPLANT  08/29/2020   [facility]) revision of left femur fracture ORIF plate fixation screws; culture positive for MSSA   Right and left CARDIAC CATHETERIZATION  04/23/2013   Angiographic normal coronaries; LVEDP 20 mmHg, PCWP 12-14 mmHg, RAP 12 mmHg.; Fick CO/CI 4.9/2.2   ROTATOR CUFF REPAIR Right 12/09/2021   SHOULDER  ARTHROSCOPY Left 03/14/2019   Procedure: LEFT SHOULDER ARTHROSCOPY, DEBRIDEMENT, AND DECOMPRESSION;  Surgeon: Harden Jerona GAILS, MD;  Location: [facility];  Service: Orthopedics;  Laterality: Left;   TOTAL KNEE ARTHROPLASTY Right 06/29/2017   Procedure: RIGHT TOTAL KNEE ARTHROPLASTY;  Surgeon: Harden Jerona GAILS, MD;  Location: [facility] OR;  Service: Orthopedics;  Laterality: Right;   TOTAL KNEE ARTHROPLASTY Left 12/07/2017   TOTAL KNEE ARTHROPLASTY Left 12/07/2017   Procedure: LEFT TOTAL KNEE ARTHROPLASTY;  Surgeon: Harden Jerona GAILS, MD;  Location: [facility] OR;  Service: Orthopedics;  Laterality: Left;   TRACHEOSTOMY TUBE PLACEMENT N/A 09/20/2017   Procedure: AWAKE INTUBATION WITH ANESTHESIA WITH VIDEO ASSISTANCE;  Surgeon: Terri Alan PARAS, MD;  Location: [facility];  Service: ENT;  Laterality: N/A;   TRANSTHORACIC ECHOCARDIOGRAM  08/2014   Normal LV size and function.  Mild LVH.  EF 55-60%.  Normal regional wall motion.  GR 1 DD.  Normal RV size and function .   TRANSTHORACIC ECHOCARDIOGRAM  08/12/2020    [facility]): Normal LV size and function.  EF 55 to 60%.  No RWM A.  Normal filling pattern.  Normal RV.  No significant valvular disease-stenosis or regurgitation.  No vegetation.   TUBAL LIGATION     with reversal in 1994   UPPER GASTROINTESTINAL ENDOSCOPY     VENTRAL HERNIA REPAIR     Allergies  Allergen Reactions   Firvanq  [Vancomycin ] Other (See Comments)    Dose related nephrotoxicity    Trexall [Methotrexate] Other (See Comments)    Peri-oral and buccal lesions   Zestril  [Lisinopril ] Cough  Chlorhexidine  Itching   Cleocin  [Clindamycin ] Nausea And Vomiting and Rash   Lincocin [Lincomycin Hcl] Nausea And Vomiting and Rash   Teflaro [Ceftaroline] Rash and Other (See Comments)    Tolerates ceftriaxone        Objective:    Physical Exam Vitals and nursing note reviewed. Exam conducted with a chaperone present.  Constitutional:      Appearance: Normal appearance.  HENT:     Head:  Normocephalic.     Right Ear: Tympanic membrane normal.     Left Ear: Tympanic membrane normal.     Nose: Nose normal.     Mouth/Throat:     Mouth: Mucous membranes are moist.  Eyes:     Pupils: Pupils are equal, round, and reactive to light.  Cardiovascular:     Rate and Rhythm: Normal rate and regular rhythm.  Pulmonary:     Effort: Pulmonary effort is normal.     Breath sounds: Examination of the right-upper field reveals wheezing. Examination of the left-upper field reveals wheezing. Examination of the right-middle field reveals wheezing. Wheezing (expiratory) present.  Genitourinary:    Exam position: Lithotomy position.     Pubic Area: No rash or pubic lice.      Labia:        Right: No rash.        Left: No rash.      Vagina: Normal.     Cervix: Normal.  Musculoskeletal:        General: Normal range of motion.     Cervical back: Normal range of motion.  Lymphadenopathy:     Cervical: No cervical adenopathy.  Skin:    General: Skin is warm and dry.     Findings: Rash present. Rash is scaling (outer & inner pinna bilaterally, no erythema noted).  Neurological:     Mental Status: She is alert.  Psychiatric:        Mood and Affect: Mood normal.        Behavior: Behavior normal.    BP 139/78 (BP Location: Left Arm, Patient Position: Sitting, Cuff Size: Normal)   Pulse 74   Temp 97.8 F (36.6 C) (Temporal)   Ht 5' 6 (1.676 m)   Wt 218 lb (98.9 kg)   SpO2 100%   BMI 35.19 kg/m  Wt Readings from Last 3 Encounters:  10/11/23 218 lb (98.9 kg)  08/29/23 218 lb (98.9 kg)  08/23/23 218 lb 12.8 oz (99.2 kg)      Lucius Krabbe, NP

## 2023-10-13 ENCOUNTER — Other Ambulatory Visit: Payer: Self-pay

## 2023-10-14 ENCOUNTER — Other Ambulatory Visit: Payer: Self-pay | Admitting: Family Medicine

## 2023-10-17 ENCOUNTER — Ambulatory Visit (INDEPENDENT_AMBULATORY_CARE_PROVIDER_SITE_OTHER): Payer: Medicare Other

## 2023-10-17 ENCOUNTER — Ambulatory Visit (INDEPENDENT_AMBULATORY_CARE_PROVIDER_SITE_OTHER): Payer: Medicare Other | Admitting: Podiatry

## 2023-10-17 ENCOUNTER — Encounter: Payer: Self-pay | Admitting: Podiatry

## 2023-10-17 DIAGNOSIS — M2140 Flat foot [pes planus] (acquired), unspecified foot: Secondary | ICD-10-CM | POA: Diagnosis not present

## 2023-10-17 DIAGNOSIS — M778 Other enthesopathies, not elsewhere classified: Secondary | ICD-10-CM

## 2023-10-17 DIAGNOSIS — E0843 Diabetes mellitus due to underlying condition with diabetic autonomic (poly)neuropathy: Secondary | ICD-10-CM | POA: Diagnosis not present

## 2023-10-17 DIAGNOSIS — M19079 Primary osteoarthritis, unspecified ankle and foot: Secondary | ICD-10-CM

## 2023-10-17 NOTE — Progress Notes (Signed)
 Chief Complaint  Patient presents with   Callouses    Patient states she has Callouses on the outside of both feet , patient takes tylenol  for pain if she has to patient states.    Subjective:  63 year old female PMHx T2DM with chronic foot pain and history of multiple surgeries presenting for evaluation of continued chronic foot pain especially to the lateral column of the bilateral feet.  She developed symptomatic painful calluses around the fifth metatarsal tubercles and would like to discuss different treatment options  Past Medical History:  Diagnosis Date   Abnormal SPEP 04/17/2014   Acute left ankle pain 01/26/2017   ANEMIA-UNSPECIFIED 09/18/2009   Anxiety    Arthritis    Blood transfusion without reported diagnosis    Cataract    CHF (congestive heart failure) (HCC)    Chronic diastolic heart failure, NYHA class 2 (HCC)    Normal LVEDP by May 2018   COPD (chronic obstructive pulmonary disease) (HCC)    Depression    DIABETES MELLITUS, TYPE II 08/21/2006   Diabetic osteomyelitis (HCC) 05/29/2015   Fatty liver    Fracture of 5th metatarsal    non union   GERD 08/21/2006   Had fundoplication   GOUT 08/20/2010   Hammer toe of right foot    2-5th toes   Hx of umbilical hernia repair    HYPERLIPIDEMIA 08/21/2006   HYPERTENSION 08/21/2006   Infection of wound due to methicillin resistant Staphylococcus aureus (MRSA)    Internal hemorrhoids    Kidney problem    Multiple allergies 10/14/2016   OBESITY 06/04/2009   Onychomycosis 10/27/2015   Osteomyelitis of left foot (HCC) 05/29/2015   Osteoporosis    Pulmonary sarcoidosis (HCC)    Followed locally by pulmonology, but also by Dr. Lavella at [facility]   Right knee pain 01/26/2017   Vocal cord dysfunction    Wears partial dentures     Past Surgical History:  Procedure Laterality Date   ABDOMINAL HYSTERECTOMY     APPENDECTOMY     BLADDER SUSPENSION  11/11/2011   Procedure: TRANSVAGINAL TAPE (TVT) PROCEDURE;   Surgeon: Oneil FORBES Piety, MD;  Location: [facility];  Service: Gynecology;  Laterality: N/A;   CAROTIDS  02/18/2011   CAROTID DUPLEX; VERTEBRALS ARE PATENT WITH ANTEGRADE FLOW. ICA/CCA RATIO 1.61 ON RIGHT AND 0.75 ON LEFT   CATARACT EXTRACTION, BILATERAL     CHOLECYSTECTOMY  1984   COLONOSCOPY  04/29/2010   [facility]   CYSTOSCOPY  11/11/2011   Procedure: CYSTOSCOPY;  Surgeon: Oneil FORBES Piety, MD;  Location: [facility];  Service: Gynecology;  Laterality: N/A;   EXTUBATION (ENDOTRACHEAL) IN OR N/A 09/23/2017   Procedure: EXTUBATION (ENDOTRACHEAL) IN OR;  Surgeon: Terri Alan PARAS, MD;  Location: [facility];  Service: ENT;  Laterality: N/A;   FIBEROPTIC LARYNGOSCOPY AND TRACHEOSCOPY N/A 09/23/2017   Procedure: FLEXIBLE FIBEROPTIC LARYNGOSCOPY;  Surgeon: Terri Alan PARAS, MD;  Location: [facility];  Service: ENT;  Laterality: N/A;   FRACTURE SURGERY     foot   HALLUX FUSION Left 10/02/2018   Procedure: HALLUX ARTHRODESIS;  Surgeon: Janit Thresa HERO, DPM;  Location: [facility];  Service: Podiatry;  Laterality: Left;   HAMMER TOE SURGERY Left 10/02/2018   Procedure: HAMMER TOE CORRECTION 2ND 3RD 4RD FIFTH TOE;  Surgeon: Janit Thresa HERO, DPM;  Location: [facility];  Service: Podiatry;  Laterality: Left;   HAMMER TOE SURGERY Right 04/12/2019   Procedure: HAMMER TOE CORRECTION, 2ND, 3RD, 4TH AND 5TH TOES OF RIGHT  FOOT;  Surgeon: Janit Thresa HERO, DPM;  Location: [facility];  Service: Podiatry;  Laterality: Right;   HAMMER TOE SURGERY Left 10/25/2019   Procedure: HAMMER TOE REPAIR SECOND, THIRD, FOUTH;  Surgeon: Janit Thresa HERO, DPM;  Location: [facility];  Service: Podiatry;  Laterality: Left;   HERNIA REPAIR     I & D EXTREMITY Left 06/27/2015   Procedure: Partial Excision Left Calcaneus, Place Antibiotic Beads, and Wound VAC;  Surgeon: Jerona Harden GAILS, MD;  Location: [facility];  Service: Orthopedics;  Laterality: Left;   JOINT REPLACEMENT     KNEE ARTHROSCOPY     right   LEFT AND RIGHT HEART CATHETERIZATION WITH CORONARY ANGIOGRAM N/A  04/23/2013   Procedure: LEFT AND RIGHT HEART CATHETERIZATION WITH CORONARY ANGIOGRAM;  Surgeon: Alm LELON Clay, MD;  Location: [facility] CATH LAB;  Service: Cardiovascular;  Laterality: N/A;   LEFT HEART CATH AND CORONARY ANGIOGRAPHY N/A 03/11/2017   Procedure: Left Heart Cath and Coronary Angiography;  Surgeon: Wonda Sharper, MD;  Location: [facility] INVASIVE CV LAB; angiographically minimal CAD in the LAD otherwise normal.;  Normal LVEDP.  FALSE POSITIVE MYOVIEW    LEFT HEART CATH AND CORONARY ANGIOGRAPHY  07/20/2010   LVEF 50-55% WITH VERY MILD GLOBAL HYPOKINESIA; ESSENTIALLY NORMAL CORONARY ARTERIES; NORMAL LV FUNCTION   METATARSAL OSTEOTOMY WITH OPEN REDUCTION INTERNAL FIXATION (ORIF) METATARSAL WITH FUSION Left 04/09/2014   Procedure: LEFT FOOT FRACTURE OPEN TREATMENT METATARSAL INCLUDES INTERNAL FIXATION EACH;  Surgeon: Lamar DELENA Millman, MD;  Location: [facility];  Service: Orthopedics;  Laterality: Left;   NISSEN FUNDOPLICATION  2004   NM MYOVIEW  LTD --> FALSE POSITIVE  03/10/2017   : Moderate size stress-induced perfusion defect at the apex as well as ill-defined stress-induced perfusion defect in the lateral wall.  EF 55%.  INTERMEDIATE risk. -->  FALSE POSITIVE   ORIF DISTAL FEMUR FRACTURE  08/08/2020   [facility] [place]: ORIF of left extra-articular periprosthetic distal femur fracture with synthesis of the femur plate with cortical nonlocking screws.  (Thought to be pathologic fracture due to osteoporosis) -> after a fall   ORIF FEMUR W/ PERI-IMPLANT  08/29/2020   [facility]) revision of left femur fracture ORIF plate fixation screws; culture positive for MSSA   Right and left CARDIAC CATHETERIZATION  04/23/2013   Angiographic normal coronaries; LVEDP 20 mmHg, PCWP 12-14 mmHg, RAP 12 mmHg.; Fick CO/CI 4.9/2.2   ROTATOR CUFF REPAIR Right 12/09/2021   SHOULDER ARTHROSCOPY Left 03/14/2019   Procedure: LEFT SHOULDER ARTHROSCOPY, DEBRIDEMENT, AND DECOMPRESSION;   Surgeon: Harden Jerona GAILS, MD;  Location: [facility];  Service: Orthopedics;  Laterality: Left;   TOTAL KNEE ARTHROPLASTY Right 06/29/2017   Procedure: RIGHT TOTAL KNEE ARTHROPLASTY;  Surgeon: Harden Jerona GAILS, MD;  Location: [facility] OR;  Service: Orthopedics;  Laterality: Right;   TOTAL KNEE ARTHROPLASTY Left 12/07/2017   TOTAL KNEE ARTHROPLASTY Left 12/07/2017   Procedure: LEFT TOTAL KNEE ARTHROPLASTY;  Surgeon: Harden Jerona GAILS, MD;  Location: [facility] OR;  Service: Orthopedics;  Laterality: Left;   TRACHEOSTOMY TUBE PLACEMENT N/A 09/20/2017   Procedure: AWAKE INTUBATION WITH ANESTHESIA WITH VIDEO ASSISTANCE;  Surgeon: Terri Alan PARAS, MD;  Location: [facility];  Service: ENT;  Laterality: N/A;   TRANSTHORACIC ECHOCARDIOGRAM  08/2014   Normal LV size and function.  Mild LVH.  EF 55-60%.  Normal regional wall motion.  GR 1 DD.  Normal RV size and function .   TRANSTHORACIC ECHOCARDIOGRAM  08/12/2020    [facility]): Normal LV  size and function.  EF 55 to 60%.  No RWM A.  Normal filling pattern.  Normal RV.  No significant valvular disease-stenosis or regurgitation.  No vegetation.   TUBAL LIGATION     with reversal in 1994   UPPER GASTROINTESTINAL ENDOSCOPY     VENTRAL HERNIA REPAIR      Allergies  Allergen Reactions   Firvanq  [Vancomycin ] Other (See Comments)    Dose related nephrotoxicity    Trexall [Methotrexate] Other (See Comments)    Peri-oral and buccal lesions   Zestril  [Lisinopril ] Cough   Chlorhexidine  Itching   Cleocin  [Clindamycin ] Nausea And Vomiting and Rash   Lincocin [Lincomycin Hcl] Nausea And Vomiting and Rash   Teflaro [Ceftaroline] Rash and Other (See Comments)    Tolerates ceftriaxone     Objective: Physical Exam General: The patient is alert and oriented x3 in no acute distress.  Dermatology: Skin is cool, dry and supple bilateral lower extremities.  Hyperkeratotic preulcerative calluses noted around the fifth metatarsal tubercles of the bilateral feet  Vascular:  Palpable pedal pulses bilaterally. No edema or erythema noted. Capillary refill within normal limits.  Neurological: Light touch and protective threshold diminished intact bilaterally.   Musculoskeletal Exam: History of multiple surgeries with pes planovalgus deformity bilateral.  With weightbearing there is excessive loading to the lateral column of the bilateral feet.  Radiographic exam B/L feet 10/17/2023 Chronic degenerative changes noted to the bilateral feet with collapse of the medial longitudinal arch and pes planovalgus deformity.  Orthopedic hardware to the left foot appears stable and intact.  No acute fracture identified however there is chronic fracture at the base of the fourth metatarsal of the right foot  Assessment: 1.  Diabetes mellitus with peripheral polyneuropathy 2.  Pes planovalgus deformity bilateral 3.  History of multiple surgeries bilateral feet 4.  Preulcerative callus lesions fifth metatarsal tubercles bilateral  -Patient evaluated - Given the patient's condition and multiple surgeries we are going to pursue conservative care for now.  I do believe the patient would benefit from diabetic shoes with custom molded Plastizote insoles to equally distribute pressure throughout the foot and potentially alleviate the pain especially to the lateral column -Appointment with orthotics department for new diabetic shoes and custom molded insoles.  Order placed -Return to clinic with me as needed  Thresa EMERSON Sar, DPM [facility]  Dr. Thresa EMERSON Sar, DPM    [address] [place], [place] [address]                Office [phone]  Fax [phone]

## 2023-10-18 ENCOUNTER — Other Ambulatory Visit (HOSPITAL_COMMUNITY): Payer: Self-pay

## 2023-10-18 ENCOUNTER — Telehealth: Payer: Self-pay | Admitting: Pharmacy Technician

## 2023-10-18 ENCOUNTER — Other Ambulatory Visit: Payer: Self-pay

## 2023-10-18 NOTE — Telephone Encounter (Addendum)
 Pharmacy Patient Advocate Encounter   Received notification from RX Request Messages that prior authorization for Lantus /Semglee  is required/requested.   Insurance verification completed.   The patient is insured through [name] . Looks like pt's ins changed at the first of the year.   Called the pharmacy. Pt was able to get a 89 day supply for $75. Filled 10/13/23

## 2023-10-18 NOTE — Telephone Encounter (Signed)
 A user error has taken place: encounter opened in error, closed for administrative reasons.

## 2023-10-20 ENCOUNTER — Ambulatory Visit (INDEPENDENT_AMBULATORY_CARE_PROVIDER_SITE_OTHER): Payer: 59 | Admitting: "Endocrinology

## 2023-10-20 VITALS — BP 128/70 | HR 88 | Ht 66.0 in | Wt 217.0 lb

## 2023-10-20 DIAGNOSIS — Z7985 Long-term (current) use of injectable non-insulin antidiabetic drugs: Secondary | ICD-10-CM

## 2023-10-20 DIAGNOSIS — E1165 Type 2 diabetes mellitus with hyperglycemia: Secondary | ICD-10-CM | POA: Diagnosis not present

## 2023-10-20 DIAGNOSIS — Z794 Long term (current) use of insulin: Secondary | ICD-10-CM | POA: Diagnosis not present

## 2023-10-20 DIAGNOSIS — E7800 Pure hypercholesterolemia, unspecified: Secondary | ICD-10-CM

## 2023-10-20 DIAGNOSIS — Z7984 Long term (current) use of oral hypoglycemic drugs: Secondary | ICD-10-CM | POA: Diagnosis not present

## 2023-10-20 LAB — POCT GLYCOSYLATED HEMOGLOBIN (HGB A1C): Hemoglobin A1C: 6.6 % — AB (ref 4.0–5.6)

## 2023-10-20 MED ORDER — TIRZEPATIDE 7.5 MG/0.5ML ~~LOC~~ SOAJ: 7.5000 mg | SUBCUTANEOUS | 1 refills | Status: DC

## 2023-10-20 MED ORDER — TIRZEPATIDE 5 MG/0.5ML ~~LOC~~ SOAJ: 5.0000 mg | SUBCUTANEOUS | 1 refills | Status: DC

## 2023-10-20 NOTE — Patient Instructions (Addendum)
 Will recommend the following: Metformin  2000 mg a day, start mounajro 5 mg/week, stop Trulicity  4.5 mg weekly-wants to lose weight  Lantus  22 units at am, Premeal NovoLog  07-22-13 fifteen min before meals ___________   Goals of DM therapy:  Morning Fasting blood sugar: 80-140  Blood sugar before meals: 80-140 Bed time blood sugar: 100-150  A1C <7%, limited only by hypoglycemia  1.Diabetes medications and their side effects discussed, including hypoglycemia    2. Check blood glucose:  a) Always check blood sugars before driving. Please see below (under hypoglycemia) on how to manage b) Check a minimum of 3 times/day or more as needed when having symptoms of hypoglycemia.   c) Try to check blood glucose before sleeping/in the middle of the night to ensure that it is remaining stable and not dropping less than 100 d) Check blood glucose more often if sick  3. Diet: a) 3 meals per day schedule b: Restrict carbs to 60-70 grams (4 servings) per meal c) Colorful vegetables - 3 servings a day, and low sugar fruit 2 servings/day Plate control method: 1/4 plate protein, 1/4 starch, 1/2 green, yellow, or red vegetables d) Avoid carbohydrate snacks unless hypoglycemic episode, or increased physical activity  4. Regular exercise as tolerated, preferably 3 or more hours a week  5. Hypoglycemia: a)  Do not drive or operate machinery without first testing blood glucose to assure it is over 90 mg%, or if dizzy, lightheaded, not feeling normal, etc, or  if foot or leg is numb or weak. b)  If blood glucose less than 70, take four 5gm Glucose tabs or 15-30 gm Glucose gel.  Repeat every 15 min as needed until blood sugar is >100 mg/dl. If hypoglycemia persists then call 911.   6. Sick day management: a) Check blood glucose more often b) Continue usual therapy if blood sugars are elevated.   7. Contact the doctor immediately if blood glucose is frequently <60 mg/dl, or an episode of severe  hypoglycemia occurs (where someone had to give you glucose/  glucagon or if you passed out from a low blood glucose), or if blood glucose is persistently >350 mg/dl, for further management  8. A change in level of physical activity or exercise and a change in diet may also affect your blood sugar. Check blood sugars more often and call if needed.  Instructions: 1. Bring glucose meter, blood glucose records on every visit for review 2. Continue to follow up with primary care physician and other providers for medical care 3. Yearly eye  and foot exam 4. Please get blood work done prior to the next appointment

## 2023-10-20 NOTE — Progress Notes (Signed)
 Outpatient Endocrinology Note [name] Birmingham, MD  10/20/23   [name] 07-Dec-1960 [id number]  Referring Provider: Katrinka Garnette KIDD, MD Primary Care Provider: Katrinka Garnette KIDD, MD Reason for consultation: Subjective   Assessment & Plan  Diagnoses and all orders for this visit:  Uncontrolled type 2 diabetes mellitus with hyperglycemia, with long-term current use of insulin  (HCC) -     POCT glycosylated hemoglobin (Hb A1C) -     Lipid panel -     Microalbumin / creatinine urine ratio -     Comprehensive metabolic panel  Long term (current) use of oral hypoglycemic drugs  Long-term (current) use of injectable non-insulin  antidiabetic drugs  Long-term insulin  use (HCC)  Pure hypercholesterolemia  Other orders -     Discontinue: tirzepatide  (MOUNJARO ) 7.5 MG/0.5ML Pen; Inject 7.5 mg into the skin once a week. -     tirzepatide  (MOUNJARO ) 5 MG/0.5ML Pen; Inject 5 mg into the skin once a week.   Diabetes complicated by nephropathy Hba1c goal less than 7.0, current Hba1c is 7. Will recommend the following: Metformin  2000 mg a day, start mounajro 5 mg/week, stop Trulicity  4.5 mg weekly (wants to lose weight) Lantus  22 units at am, Premeal NovoLog  07-22-13 fifteen min before meals  Got sick with ozempic   No known contraindications to any of above medications  Hyperlipidemia -Last LDL at goal: 56, Tg 154 -on atorvastatin  40 mg QD and fenofibrate  160 mg qd -Follow low fat diet and exercise    -Blood pressure goal <140/90 - Microalbumin/creatinine at goal < 30 - on ACE/ARB Telmisartan  40 mg qd -diet changes including salt restriction -limit eating outside -counseled BP targets per standards of diabetes care -Uncontrolled blood pressure can lead to retinopathy, nephropathy and cardiovascular and atherosclerotic heart disease  Reviewed and counseled on: -A1C target -Blood sugar targets -Complications of uncontrolled diabetes  -Checking blood sugar  before meals and bedtime and bring log next visit -All medications with mechanism of action and side effects -Hypoglycemia management: rule of 15's, Glucagon Emergency Kit and medical alert ID -low-carb low-fat plate-method diet -At least 20 minutes of physical activity per day -Annual dilated retinal eye exam and foot exam -compliance and follow up needs -follow up as scheduled or earlier if problem gets worse  Call if blood sugar is less than 70 or consistently above 250    Take a 15 gm snack of carbohydrate at bedtime before you go to sleep if your blood sugar is less than 100.    If you are going to fast after midnight for a test or procedure, ask your physician for instructions on how to reduce/decrease your insulin  dose.    Call if blood sugar is less than 70 or consistently above 250  -Treating a low sugar by rule of 15  (15 gms of sugar every 15 min until sugar is more than 70) If you feel your sugar is low, test your sugar to be sure If your sugar is low (less than 70), then take 15 grams of a fast acting Carbohydrate (3-4 glucose tablets or glucose gel or 4 ounces of juice or regular soda) Recheck your sugar 15 min after treating low to make sure it is more than 70 If sugar is still less than 70, treat again with 15 grams of carbohydrate          Don't drive the hour of hypoglycemia  If unconscious/unable to eat or drink by mouth, use glucagon injection or nasal spray baqsimi and  call 911. Can repeat again in 15 min if still unconscious.  Return in about 4 weeks (around 11/17/2023) for visit, labs today.   I have reviewed current medications, nurse's notes, allergies, vital signs, past medical and surgical history, family medical history, and social history for this encounter. Counseled patient on symptoms, examination findings, lab findings, imaging results, treatment decisions and monitoring and prognosis. The patient understood the recommendations and agrees with the treatment  plan. All questions regarding treatment plan were fully answered.  [name] Birmingham, MD  10/20/23   History of Present Illness [name] is a 63 y.o. year old female who presents for evaluation of Type 2 diabetes mellitus.  [name] was first diagnosed in 63    Home diabetes regimen: Metformin  2000 mg a day, Trulicity  4.5 mg weekly Lantus  23 units at am, Premeal NovoLog  07-22-13  Previous history:  Non-insulin  hypoglycemic drugs previously used: Ozempic , metformin  Insulin  was started a few years after diagnosis Side effects from medications: Ozempic : nausea  COMPLICATIONS -  MI/Stroke -  retinopathy +  neuropathy-sees podiatrist annually Dr Janit  +  nephropathy  BLOOD SUGAR DATA  CGM interpretation: At today's visit, we reviewed her CGM downloads. The full report is scanned in the media. Reviewing the CGM trends, blood glucose is mildly elevated in daytime.  Physical Exam  BP 128/70   Pulse 88   Ht 5' 6 (1.676 m)   Wt 217 lb (98.4 kg)   SpO2 94%   BMI 35.02 kg/m    Constitutional: well developed, well nourished Head: normocephalic, atraumatic Eyes: sclera anicteric, no redness Neck: supple Lungs: normal respiratory effort Neurology: alert and oriented Skin: dry, no appreciable rashes Musculoskeletal: no appreciable defects Psychiatric: normal mood and affect   Current Medications Patient's Medications  New Prescriptions   TIRZEPATIDE  (MOUNJARO ) 5 MG/0.5ML PEN    Inject 5 mg into the skin once a week.  Previous Medications   ALBUTEROL  (PROVENTIL ) (2.5 MG/3ML) 0.083% NEBULIZER SOLUTION    Take 3 mLs (2.5 mg total) by nebulization every 6 (six) hours as needed for wheezing or shortness of breath (Use 2-3 times per day when sick.).   ALBUTEROL  (VENTOLIN  HFA) 108 (90 BASE) MCG/ACT INHALER    TAKE 2 PUFFS BY MOUTH EVERY 6 HOURS AS NEEDED FOR WHEEZE OR SHORTNESS OF BREATH   ASPIRIN  EC 81 MG TABLET    Take 1 tablet (81 mg total) by mouth daily.    ATORVASTATIN  (LIPITOR) 40 MG TABLET    Take 1 tablet (40 mg total) by mouth daily.   BENZONATATE  (TESSALON  PERLES) 100 MG CAPSULE    Take 1-2 capsules (100-200 mg total) by mouth 3 (three) times daily as needed for cough.   BREO ELLIPTA  200-25 MCG/INH AEPB    TAKE 1 PUFF BY MOUTH EVERY DAY   BUPROPION  (WELLBUTRIN  XL) 300 MG 24 HR TABLET    TAKE 1 TABLET BY MOUTH EVERY DAY   CHLORPHENIRAMINE -HYDROCODONE  (TUSSIONEX) 10-8 MG/5ML    Take 5 mLs by mouth at bedtime as needed for cough (do not drive for 8 hours after taking).   CONTINUOUS GLUCOSE SENSOR (DEXCOM G7 SENSOR) MISC    1 Device by Does not apply route as directed. Change sensor every 10 days   CYCLOBENZAPRINE  (FLEXERIL ) 5 MG TABLET    Take 1 tablet (5 mg total) by mouth 3 (three) times daily as needed for muscle spasms (or Headache).   DILTIAZEM  (CARDIZEM  CD) 120 MG 24 HR CAPSULE    Take 1 capsule (  120 mg total) by mouth daily.   ESTRADIOL  (ESTRACE ) 0.1 MG/GM VAGINAL CREAM    Place 0.5 Applicatorfuls vaginally 2 (two) times a week.   FENOFIBRATE  160 MG TABLET    TAKE 1 TABLET BY MOUTH EVERY DAY   FLUTICASONE  (FLONASE ) 50 MCG/ACT NASAL SPRAY    Place 2 sprays into both nostrils daily as needed for allergies.   FUROSEMIDE  (LASIX ) 40 MG TABLET    Take 1 tablet (40 mg total) by mouth daily.   GABAPENTIN  (NEURONTIN ) 100 MG CAPSULE    TAKE 1 CAPSULE BY MOUTH THREE TIMES A DAY   GLUCOSE BLOOD (CONTOUR NEXT TEST) TEST STRIP    1 each by Other route 4 (four) times daily. And lancets 4/day   GUAIFENESIN -DEXTROMETHORPHAN  (ROBITUSSIN DM) 100-10 MG/5ML SYRUP    Take 5 mLs by mouth every 4 (four) hours as needed for cough (chest congestion).   IBUPROFEN  (ADVIL ) 800 MG TABLET    Take 1 tablet (800 mg total) by mouth 3 (three) times daily.   INSULIN  ASPART (NOVOLOG  FLEXPEN) 100 UNIT/ML FLEXPEN    Inject 15 Units into the skin daily with supper.   INSULIN  GLARGINE-YFGN (SEMGLEE , YFGN,) 100 UNIT/ML PEN    Inject 16 Units into the skin every morning.   INSULIN   PEN NEEDLE (PEN NEEDLES) 32G X 4 MM MISC    1 each by Does not apply route 4 (four) times daily. E11.9   IPRATROPIUM (ATROVENT ) 0.06 % NASAL SPRAY    PLACE 2 SPRAYS INTO BOTH NOSTRILS 4 TIMES DAILY.   KLOR-CON  M20 20 MEQ TABLET    TAKE 1 & 1/2 TABLETS BY MOUTH TWICE A DAY   LANCET DEVICES (EASY MINI EJECT LANCING DEVICE) MISC    Use as directed 4 times daily to test blood sugar   LINZESS  290 MCG CAPS CAPSULE    TAKE 1 CAPSULE BY MOUTH DAILY BEFORE BREAKFAST.   METFORMIN  (GLUCOPHAGE -XR) 500 MG 24 HR TABLET    TAKE 4 TABLETS BY MOUTH DAILY WITH BREAKFAST.   MICROLET LANCETS MISC    1 each by Does not apply route 2 (two) times daily. E11.9   ONDANSETRON  (ZOFRAN -ODT) 4 MG DISINTEGRATING TABLET    Take 1 tablet (4 mg total) by mouth every 8 (eight) hours as needed for nausea or vomiting.   PANTOPRAZOLE  (PROTONIX ) 40 MG TABLET    TAKE 1 TABLET BY MOUTH TWICE A DAY   TELMISARTAN  (MICARDIS ) 40 MG TABLET    Take 1 tablet (40 mg total) by mouth daily.   VENLAFAXINE  XR (EFFEXOR -XR) 75 MG 24 HR CAPSULE    TAKE 1 CAPSULE BY MOUTH TWICE A DAY  Modified Medications   No medications on file  Discontinued Medications   DULAGLUTIDE  (TRULICITY ) 4.5 MG/0.5ML SOPN    Inject 4.5 mg as directed once a week.    Allergies Allergies  Allergen Reactions   Firvanq  [Vancomycin ] Other (See Comments)    Dose related nephrotoxicity    Trexall [Methotrexate] Other (See Comments)    Peri-oral and buccal lesions   Zestril  [Lisinopril ] Cough   Chlorhexidine  Itching   Cleocin  [Clindamycin ] Nausea And Vomiting and Rash   Lincocin [Lincomycin Hcl] Nausea And Vomiting and Rash   Teflaro [Ceftaroline] Rash and Other (See Comments)    Tolerates ceftriaxone      Past Medical History Past Medical History:  Diagnosis Date   Abnormal SPEP 04/17/2014   Acute left ankle pain 01/26/2017   ANEMIA-UNSPECIFIED 09/18/2009   Anxiety    Arthritis    Blood transfusion  without reported diagnosis    Cataract    CHF (congestive heart  failure) (HCC)    Chronic diastolic heart failure, NYHA class 2 (HCC)    Normal LVEDP by May 2018   COPD (chronic obstructive pulmonary disease) (HCC)    Depression    DIABETES MELLITUS, TYPE II 08/21/2006   Diabetic osteomyelitis (HCC) 05/29/2015   Fatty liver    Fracture of 5th metatarsal    non union   GERD 08/21/2006   Had fundoplication   GOUT 08/20/2010   Hammer toe of right foot    2-5th toes   Hx of umbilical hernia repair    HYPERLIPIDEMIA 08/21/2006   HYPERTENSION 08/21/2006   Infection of wound due to methicillin resistant Staphylococcus aureus (MRSA)    Internal hemorrhoids    Kidney problem    Multiple allergies 10/14/2016   OBESITY 06/04/2009   Onychomycosis 10/27/2015   Osteomyelitis of left foot (HCC) 05/29/2015   Osteoporosis    Pulmonary sarcoidosis (HCC)    Followed locally by pulmonology, but also by Dr. Lavella at [facility]   Right knee pain 01/26/2017   Vocal cord dysfunction    Wears partial dentures     Past Surgical History Past Surgical History:  Procedure Laterality Date   ABDOMINAL HYSTERECTOMY     APPENDECTOMY     BLADDER SUSPENSION  11/11/2011   Procedure: TRANSVAGINAL TAPE (TVT) PROCEDURE;  Surgeon: Oneil FORBES Piety, MD;  Location: [facility];  Service: Gynecology;  Laterality: N/A;   CAROTIDS  02/18/2011   CAROTID DUPLEX; VERTEBRALS ARE PATENT WITH ANTEGRADE FLOW. ICA/CCA RATIO 1.61 ON RIGHT AND 0.75 ON LEFT   CATARACT EXTRACTION, BILATERAL     CHOLECYSTECTOMY  1984   COLONOSCOPY  04/29/2010   [facility]   CYSTOSCOPY  11/11/2011   Procedure: CYSTOSCOPY;  Surgeon: Oneil FORBES Piety, MD;  Location: [facility];  Service: Gynecology;  Laterality: N/A;   EXTUBATION (ENDOTRACHEAL) IN OR N/A 09/23/2017   Procedure: EXTUBATION (ENDOTRACHEAL) IN OR;  Surgeon: Terri Alan PARAS, MD;  Location: [facility];  Service: ENT;  Laterality: N/A;   FIBEROPTIC LARYNGOSCOPY AND TRACHEOSCOPY N/A 09/23/2017   Procedure: FLEXIBLE FIBEROPTIC LARYNGOSCOPY;  Surgeon:  Terri Alan PARAS, MD;  Location: [facility];  Service: ENT;  Laterality: N/A;   FRACTURE SURGERY     foot   HALLUX FUSION Left 10/02/2018   Procedure: HALLUX ARTHRODESIS;  Surgeon: Janit Thresa HERO, DPM;  Location: [facility];  Service: Podiatry;  Laterality: Left;   HAMMER TOE SURGERY Left 10/02/2018   Procedure: HAMMER TOE CORRECTION 2ND 3RD 4RD FIFTH TOE;  Surgeon: Janit Thresa HERO, DPM;  Location: [facility];  Service: Podiatry;  Laterality: Left;   HAMMER TOE SURGERY Right 04/12/2019   Procedure: HAMMER TOE CORRECTION, 2ND, 3RD, 4TH AND 5TH TOES OF RIGHT FOOT;  Surgeon: Janit Thresa HERO, DPM;  Location: [facility];  Service: Podiatry;  Laterality: Right;   HAMMER TOE SURGERY Left 10/25/2019   Procedure: HAMMER TOE REPAIR SECOND, THIRD, FOUTH;  Surgeon: Janit Thresa HERO, DPM;  Location: [facility];  Service: Podiatry;  Laterality: Left;   HERNIA REPAIR     I & D EXTREMITY Left 06/27/2015   Procedure: Partial Excision Left Calcaneus, Place Antibiotic Beads, and Wound VAC;  Surgeon: Jerona Harden GAILS, MD;  Location: [facility];  Service: Orthopedics;  Laterality: Left;   JOINT REPLACEMENT     KNEE ARTHROSCOPY     right   LEFT AND RIGHT HEART CATHETERIZATION WITH CORONARY ANGIOGRAM N/A 04/23/2013  Procedure: LEFT AND RIGHT HEART CATHETERIZATION WITH CORONARY ANGIOGRAM;  Surgeon: Alm LELON Clay, MD;  Location: [facility] CATH LAB;  Service: Cardiovascular;  Laterality: N/A;   LEFT HEART CATH AND CORONARY ANGIOGRAPHY N/A 03/11/2017   Procedure: Left Heart Cath and Coronary Angiography;  Surgeon: Wonda Sharper, MD;  Location: [facility] INVASIVE CV LAB; angiographically minimal CAD in the LAD otherwise normal.;  Normal LVEDP.  FALSE POSITIVE MYOVIEW    LEFT HEART CATH AND CORONARY ANGIOGRAPHY  07/20/2010   LVEF 50-55% WITH VERY MILD GLOBAL HYPOKINESIA; ESSENTIALLY NORMAL CORONARY ARTERIES; NORMAL LV FUNCTION   METATARSAL OSTEOTOMY WITH OPEN REDUCTION INTERNAL FIXATION (ORIF) METATARSAL WITH FUSION Left 04/09/2014   Procedure: LEFT FOOT  FRACTURE OPEN TREATMENT METATARSAL INCLUDES INTERNAL FIXATION EACH;  Surgeon: Lamar DELENA Millman, MD;  Location: [facility];  Service: Orthopedics;  Laterality: Left;   NISSEN FUNDOPLICATION  2004   NM MYOVIEW  LTD --> FALSE POSITIVE  03/10/2017   : Moderate size stress-induced perfusion defect at the apex as well as ill-defined stress-induced perfusion defect in the lateral wall.  EF 55%.  INTERMEDIATE risk. -->  FALSE POSITIVE   ORIF DISTAL FEMUR FRACTURE  08/08/2020   [facility]: ORIF of left extra-articular periprosthetic distal femur fracture with synthesis of the femur plate with cortical nonlocking screws.  (Thought to be pathologic fracture due to osteoporosis) -> after a fall   ORIF FEMUR W/ PERI-IMPLANT  08/29/2020   [facility]) revision of left femur fracture ORIF plate fixation screws; culture positive for MSSA   Right and left CARDIAC CATHETERIZATION  04/23/2013   Angiographic normal coronaries; LVEDP 20 mmHg, PCWP 12-14 mmHg, RAP 12 mmHg.; Fick CO/CI 4.9/2.2   ROTATOR CUFF REPAIR Right 12/09/2021   SHOULDER ARTHROSCOPY Left 03/14/2019   Procedure: LEFT SHOULDER ARTHROSCOPY, DEBRIDEMENT, AND DECOMPRESSION;  Surgeon: Harden Jerona GAILS, MD;  Location[facility];  Service: Orthopedics;  Laterality: Left;   TOTAL KNEE ARTHROPLASTY Right 06/29/2017   Procedure: RIGHT TOTAL KNEE ARTHROPLASTY;  Surgeon: Harden Jerona GAILS, MD;  [facility] OR;  Service: Orthopedics;  Laterality: Right;   TOTAL KNEE ARTHROPLASTY Left 12/07/2017   TOTAL KNEE ARTHROPLASTY Left 12/07/2017   Procedure: LEFT TOTAL KNEE ARTHROPLASTY;  Surgeon: Harden Jerona GAILS, MD;  Location[facility] OR;  Service: Orthopedics;  Laterality: Left;   TRACHEOSTOMY TUBE PLACEMENT N/A 09/20/2017   Procedure: AWAKE INTUBATION WITH ANESTHESIA WITH VIDEO ASSISTANCE;  Surgeon: Terri Alan PARAS, MD;  Location[facility];  Service: ENT;  Laterality: N/A;   TRANSTHORACIC ECHOCARDIOGRAM  08/2014   Normal LV size and  function.  Mild LVH.  EF 55-60%.  Normal regional wall motion.  GR 1 DD.  Normal RV size and function .   TRANSTHORACIC ECHOCARDIOGRAM  08/12/2020    [facility]): Normal LV size and function.  EF 55 to 60%.  No RWM A.  Normal filling pattern.  Normal RV.  No significant valvular disease-stenosis or regurgitation.  No vegetation.   TUBAL LIGATION     with reversal in 1994   UPPER GASTROINTESTINAL ENDOSCOPY     VENTRAL HERNIA REPAIR      Family History family history includes Asthma in her mother; Breast cancer in her mother; COPD in her mother; Coronary artery disease in her father; Diabetes in her brother, father, and mother; Emphysema in her mother; Heart attack in her maternal grandfather; Heart attack (age of onset: 24) in her father; Heart disease in her father; Heart failure in her father and mother; Hypertension  in her father; Kidney disease in her mother; Lung cancer in her brother; Obesity in her father; Sarcoidosis in her maternal uncle; Sleep apnea in her father; Throat cancer in her father; Thyroid  disease in her mother.  Social History Social History   Socioeconomic History   Marital status: Married    Spouse name: YAIRA BERNARDI   Number of children: 2   Years of education: 12   Highest education level: 12th grade  Occupational History   Occupation: DISABLED  Tobacco Use   Smoking status: Never   Smokeless tobacco: Never  Vaping Use   Vaping status: Never Used  Substance and Sexual Activity   Alcohol use: No    Alcohol/week: 0.0 standard drinks of alcohol   Drug use: No   Sexual activity: Yes    Birth control/protection: Surgical  Other Topics Concern   Not on file  Social History Narrative   Married 1994. 2 sons who both live close and 1 grandson.       Disability due to sarcoidosis. 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  [employer]   Social Drivers of Health   Financial Resource Strain: Low Risk  (10/10/2023)   Overall Financial Resource Strain (CARDIA)    Difficulty of Paying Living Expenses: Not hard at all  Food Insecurity: No Food Insecurity (10/10/2023)   Hunger Vital Sign    Worried About Running Out of Food in the Last Year: Never true    Ran Out of Food in the Last Year: Never true  Transportation Needs: No Transportation Needs (10/10/2023)   PRAPARE - Administrator, Civil Service (Medical): No    Lack of Transportation (Non-Medical): No  Physical Activity: Insufficiently Active (10/10/2023)   Exercise Vital Sign    Days of Exercise per Week: 3 days    Minutes of Exercise per Session: 30 min  Stress: No Stress Concern Present (10/10/2023)   [employer] of Occupational Health - Occupational Stress Questionnaire    Feeling of Stress : Not at all  Social Connections: Socially Integrated (10/10/2023)   Social Connection and Isolation Panel [NHANES]    Frequency of Communication with Friends and Family: More than three times a week    Frequency of Social Gatherings with Friends and Family: Twice a week    Attends Religious Services: More than 4 times per year    Active Member of Clubs or Organizations: No    Attends Engineer, Structural: More than 4 times per year    Marital Status: Married  Catering Manager Violence: Not At Risk (08/09/2023)   Humiliation, Afraid, Rape, and Kick questionnaire    Fear of Current or Ex-Partner: No    Emotionally Abused: No    Physically Abused: No    Sexually Abused: No    Lab Results  Component Value Date   HGBA1C 6.6 (A) 10/20/2023   HGBA1C 7.0 (A) 07/27/2023   HGBA1C 6.9 (A) 03/21/2023   Lab Results  Component Value Date   CHOL 137 10/12/2022   Lab Results  Component Value Date   HDL 50.60 10/12/2022   Lab Results  Component Value Date   LDLCALC 56 10/12/2022   Lab Results  Component Value Date   TRIG  154.0 (H) 10/12/2022   Lab Results  Component Value Date   CHOLHDL 3 10/12/2022   Lab Results  Component Value Date   CREATININE 1.00 08/10/2023   Lab Results  Component Value Date   GFR 50.57 (L) 06/15/2023   Lab Results  Component Value Date   MICROALBUR 2.2 (H) 02/25/2023      Component Value Date/Time   NA 137 08/10/2023 0609   NA 139 10/20/2020 0000   NA 135 (L) 04/17/2014 1039   K 4.8 08/10/2023 0609   K 4.5 04/17/2014 1039   CL 100 08/10/2023 0609   CO2 25 08/10/2023 0609   CO2 26 04/17/2014 1039   GLUCOSE 268 (H) 08/10/2023 0609   GLUCOSE 300 (H) 04/17/2014 1039   GLUCOSE 150 (H) 09/14/2006 1033   BUN 23 08/10/2023 0609   BUN 10 10/20/2020 0000   BUN 13.9 04/17/2014 1039   CREATININE 1.00 08/10/2023 0609   CREATININE 0.88 08/21/2020 1404   CREATININE 0.8 04/17/2014 1039   CALCIUM  9.5 08/10/2023 0609   CALCIUM  10.0 04/17/2014 1039   PROT 7.0 06/15/2023 1453   PROT 6.7 03/31/2020 1316   PROT 6.8 04/17/2014 1039   ALBUMIN 4.4 06/15/2023 1453   ALBUMIN 4.6 03/31/2020 1316   ALBUMIN 3.7 04/17/2014 1039   AST 22 06/15/2023 1453   AST 8 04/17/2014 1039   ALT 19 06/15/2023 1453   ALT 20 04/17/2014 1039   ALKPHOS 67 06/15/2023 1453   ALKPHOS 82 04/17/2014 1039   BILITOT 0.6 06/15/2023 1453   BILITOT 0.3 03/31/2020 1316   BILITOT 0.45 04/17/2014 1039   GFRNONAA >60 08/10/2023 0609   GFRNONAA 71 08/21/2020 1404   GFRAA 96 10/20/2020 0000   GFRAA 82 08/21/2020 1404      Latest Ref Rng & Units 08/10/2023    6:09 AM 08/09/2023    9:41 AM 06/16/2023   10:47 AM  BMP  Glucose 70 - 99 mg/dL 731  846    BUN 8 - 23 mg/dL 23  23    Creatinine 9.55 - 1.00 mg/dL 8.99  8.93  8.69   Sodium 135 - 145 mmol/L 137  137    Potassium 3.5 - 5.1 mmol/L 4.8  4.1    Chloride 98 - 111 mmol/L 100  101    CO2 22 - 32 mmol/L 25  25    Calcium  8.9 - 10.3 mg/dL 9.5  9.2         Component Value Date/Time   WBC 14.2 (H) 08/10/2023 0609   RBC 3.50 (L) 08/10/2023 0609   HGB  10.6 (L) 08/10/2023 0609   HGB 12.1 04/17/2014 1039   HCT 32.1 (L) 08/10/2023 0609   HCT 36.9 04/17/2014 1039   PLT 326 08/10/2023 0609   PLT 436 (H) 04/17/2014 1039   MCV 91.7 08/10/2023 0609   MCV 87.9 04/17/2014 1039   MCH 30.3 08/10/2023 0609   MCHC 33.0 08/10/2023 0609   RDW 12.6 08/10/2023 0609   RDW 14.5 04/17/2014 1039   LYMPHSABS 3.1 08/09/2023 0941   LYMPHSABS 2.2 04/17/2014 1039   MONOABS 0.7 08/09/2023 0941   MONOABS 0.8 04/17/2014 1039   EOSABS 0.6 (H) 08/09/2023 0941   EOSABS 0.1 04/17/2014 1039   BASOSABS 0.1 08/09/2023 0941   BASOSABS 0.0 04/17/2014 1039     Parts of this note may have been dictated using voice recognition software. There may be variances in spelling and vocabulary which are unintentional. Not all errors are proofread. Please notify the dino if any discrepancies are noted or if the meaning of any statement is not clear.

## 2023-10-21 ENCOUNTER — Encounter: Payer: Self-pay | Admitting: "Endocrinology

## 2023-10-21 LAB — MICROALBUMIN / CREATININE URINE RATIO
Creatinine, Urine: 92 mg/dL (ref 20–275)
Microalb Creat Ratio: 15 mg/g{creat} (ref <30)
Microalb, Ur: 1.4 mg/dL

## 2023-10-21 LAB — LIPID PANEL
Cholesterol: 151 mg/dL (ref <200)
HDL: 50 mg/dL (ref >=50)
LDL Cholesterol (Calc): 76 mg/dL
Non-HDL Cholesterol (Calc): 101 mg/dL (ref <130)
Total CHOL/HDL Ratio: 3 (calc) (ref <5.0)
Triglycerides: 157 mg/dL — ABNORMAL HIGH (ref <150)

## 2023-10-21 LAB — COMPREHENSIVE METABOLIC PANEL
AG Ratio: 2.1 (calc) (ref 1.0–2.5)
ALT: 19 U/L (ref 6–29)
AST: 24 U/L (ref 10–35)
Albumin: 4.7 g/dL (ref 3.6–5.1)
Alkaline phosphatase (APISO): 69 U/L (ref 37–153)
BUN/Creatinine Ratio: 19 (calc) (ref 6–22)
BUN: 22 mg/dL (ref 7–25)
CO2: 26 mmol/L (ref 20–32)
Calcium: 10 mg/dL (ref 8.6–10.4)
Chloride: 102 mmol/L (ref 98–110)
Creat: 1.18 mg/dL — ABNORMAL HIGH (ref 0.50–1.05)
Globulin: 2.2 g/dL (ref 1.9–3.7)
Glucose, Bld: 176 mg/dL — ABNORMAL HIGH (ref 65–99)
Potassium: 5.1 mmol/L (ref 3.5–5.3)
Sodium: 137 mmol/L (ref 135–146)
Total Bilirubin: 0.7 mg/dL (ref 0.2–1.2)
Total Protein: 6.9 g/dL (ref 6.1–8.1)

## 2023-10-25 ENCOUNTER — Telehealth: Payer: Self-pay

## 2023-10-25 ENCOUNTER — Other Ambulatory Visit (HOSPITAL_COMMUNITY): Payer: Self-pay

## 2023-10-25 NOTE — Telephone Encounter (Signed)
 Pharmacy Patient Advocate Encounter   Received notification from CoverMyMeds that prior authorization for Mounjaro  is required/requested.   Insurance verification completed.   The patient is insured through [name] .   Per test claim: PA required; PA submitted to above mentioned insurance via CoverMyMeds Key/confirmation #/EOC [id number] Status is pending

## 2023-11-01 NOTE — Telephone Encounter (Signed)
Pharmacy Patient Advocate Encounter  Received notification from [name] that Prior Authorization for [name] has been APPROVED through 10/23/2026   PA #/Case ID/Reference #: [id number]

## 2023-11-03 ENCOUNTER — Encounter: Payer: Self-pay | Admitting: "Endocrinology

## 2023-11-03 ENCOUNTER — Ambulatory Visit: Payer: Medicare Other | Admitting: Family Medicine

## 2023-11-04 ENCOUNTER — Other Ambulatory Visit (HOSPITAL_COMMUNITY): Payer: Self-pay

## 2023-11-04 ENCOUNTER — Telehealth: Payer: Self-pay

## 2023-11-04 NOTE — Telephone Encounter (Signed)
Pharmacy Patient Advocate Encounter   Received notification from CoverMyMeds that prior authorization for Dexcom G7 sensor is required/requested.   Insurance verification completed.   The patient is insured through [name] .   Per test claim: PA required; PA submitted to above mentioned insurance via CoverMyMeds Key/confirmation #/EOC [id number] Status is pending   PA is APPROVED through 11/03/2024 PA Case ID #: [id number]

## 2023-11-05 ENCOUNTER — Other Ambulatory Visit: Payer: Self-pay | Admitting: Family Medicine

## 2023-11-07 ENCOUNTER — Other Ambulatory Visit (HOSPITAL_COMMUNITY): Payer: Self-pay

## 2023-11-07 ENCOUNTER — Telehealth: Payer: Self-pay

## 2023-11-07 NOTE — Telephone Encounter (Signed)
PA needed for Dexcom G7 per patient

## 2023-11-08 ENCOUNTER — Encounter: Payer: Self-pay | Admitting: Family Medicine

## 2023-11-08 ENCOUNTER — Ambulatory Visit (INDEPENDENT_AMBULATORY_CARE_PROVIDER_SITE_OTHER): Payer: 59 | Admitting: Family Medicine

## 2023-11-08 VITALS — BP 138/68 | HR 83 | Temp 97.2°F | Ht 66.0 in | Wt 218.2 lb

## 2023-11-08 DIAGNOSIS — E1169 Type 2 diabetes mellitus with other specified complication: Secondary | ICD-10-CM

## 2023-11-08 DIAGNOSIS — J4550 Severe persistent asthma, uncomplicated: Secondary | ICD-10-CM | POA: Diagnosis not present

## 2023-11-08 DIAGNOSIS — I5032 Chronic diastolic (congestive) heart failure: Secondary | ICD-10-CM

## 2023-11-08 DIAGNOSIS — E1122 Type 2 diabetes mellitus with diabetic chronic kidney disease: Secondary | ICD-10-CM | POA: Diagnosis not present

## 2023-11-08 DIAGNOSIS — I110 Hypertensive heart disease with heart failure: Secondary | ICD-10-CM

## 2023-11-08 DIAGNOSIS — Z794 Long term (current) use of insulin: Secondary | ICD-10-CM

## 2023-11-08 DIAGNOSIS — E785 Hyperlipidemia, unspecified: Secondary | ICD-10-CM | POA: Diagnosis not present

## 2023-11-08 DIAGNOSIS — D860 Sarcoidosis of lung: Secondary | ICD-10-CM | POA: Diagnosis not present

## 2023-11-08 DIAGNOSIS — N1831 Chronic kidney disease, stage 3a: Secondary | ICD-10-CM

## 2023-11-08 LAB — CBC WITH DIFFERENTIAL/PLATELET
Basophils Absolute: 0.1 10*3/uL (ref 0.0–0.1)
Basophils Relative: 0.9 % (ref 0.0–3.0)
Eosinophils Absolute: 0.4 10*3/uL (ref 0.0–0.7)
Eosinophils Relative: 3.8 % (ref 0.0–5.0)
HCT: 33.2 % — ABNORMAL LOW (ref 36.0–46.0)
Hemoglobin: 11 g/dL — ABNORMAL LOW (ref 12.0–15.0)
Lymphocytes Relative: 27.1 % (ref 12.0–46.0)
Lymphs Abs: 2.6 10*3/uL (ref 0.7–4.0)
MCHC: 33.1 g/dL (ref 30.0–36.0)
MCV: 90 fL (ref 78.0–100.0)
Monocytes Absolute: 0.6 10*3/uL (ref 0.1–1.0)
Monocytes Relative: 6 % (ref 3.0–12.0)
Neutro Abs: 6 10*3/uL (ref 1.4–7.7)
Neutrophils Relative %: 62.2 % (ref 43.0–77.0)
Platelets: 367 10*3/uL (ref 150.0–400.0)
RBC: 3.69 Mil/uL — ABNORMAL LOW (ref 3.87–5.11)
RDW: 13.2 % (ref 11.5–15.5)
WBC: 9.7 10*3/uL (ref 4.0–10.5)

## 2023-11-08 LAB — TSH: TSH: 1.81 u[IU]/mL (ref 0.35–5.50)

## 2023-11-08 NOTE — Patient Instructions (Addendum)
Please stop by lab before you go If you have mychart- we will send your results within 3 business days of [place] receiving them.  If you do not have mychart- we will call you about results within 5 business days of [place] receiving them.  *please also note that you will see labs on mychart as soon as they post. I will later go in and write notes on them- will say "notes from Dr. Durene Cal"   See [place] back if fatigue worsens- otherwise we will plan on 4 months  Recommended follow up: Return in about 4 months (around 03/07/2024) for followup or sooner if needed.Schedule b4 you leave.

## 2023-11-08 NOTE — Progress Notes (Signed)
Phone [phone] In person visit   Subjective:   [name] is a 63 y.o. year old very pleasant female patient who presents for/with See problem oriented charting Chief Complaint  Patient presents with   Medical Management of Chronic Issues   Hypertension   Fall    Pt fell last tues.   Past Medical History-  Patient Active Problem List   Diagnosis Date Noted   Severe persistent asthma 01/04/2019    Priority: High   Subcortical microvascular ischemic occlusive disease 07/12/2018    Priority: High   Chronic diastolic CHF (congestive heart failure) (HCC) 08/13/2017    Priority: High   Pulmonary sarcoidosis (HCC)     Priority: High   Chronic bronchitis (HCC) in never smoker 09/18/2013    Priority: High   Chest pain 04/11/2013    Priority: High   Hypertensive heart disease with chronic diastolic congestive heart failure (HCC)     Priority: High   Sarcoidosis of lung (HCC) 04/10/2007    Priority: High   Type II diabetes mellitus with renal manifestations (HCC) 08/21/2006    Priority: High   Iron deficiency anemia 10/01/2020    Priority: Medium    Hypertriglyceridemia 04/02/2020    Priority: Medium    Aortic atherosclerosis (HCC) 02/14/2020    Priority: Medium    Constipation 04/21/2018    Priority: Medium    Epiglottitis     Priority: Medium    Osteoarthritis of right knee 01/26/2017    Priority: Medium    CKD (chronic kidney disease) stage 3, GFR 30-59 ml/min (HCC) 04/27/2015    Priority: Medium    History of osteomyelitis 03/27/2015    Priority: Medium    Fatty liver 09/30/2014    Priority: Medium    Major depression in full remission (HCC)     Priority: Medium    Gout 08/20/2010    Priority: Medium    Deficiency anemia 09/18/2009    Priority: Medium    Sleep apnea 04/21/2009    Priority: Medium    Hyperlipidemia associated with type 2 diabetes mellitus (HCC) 08/21/2006    Priority: Medium    Hypertension 08/21/2006    Priority: Medium     Nontraumatic complete tear of left rotator cuff     Priority: Low   Diabetic neuropathy (HCC) 02/20/2019    Priority: Low   Rapid palpitations 07/12/2018    Priority: Low   Impingement of left ankle joint 06/26/2018    Priority: Low   Total knee replacement status, left 12/07/2017    Priority: Low   Dysphagia 09/20/2017    Priority: Low   Plantar fasciitis, right 07/13/2017    Priority: Low   S/P total knee arthroplasty, right 06/29/2017    Priority: Low   Osteoarthrosis, localized, primary, knee, right     Priority: Low   Sprain of calcaneofibular ligament of right ankle 06/06/2017    Priority: Low   Onychomycosis 10/27/2015    Priority: Low   MRSA (methicillin resistant staph aureus) culture positive 03/27/2015    Priority: Low   Hot flashes 07/15/2014    Priority: Low   Abnormal SPEP 04/17/2014    Priority: Low   Fracture of left leg 04/17/2014    Priority: Low   Internal hemorrhoids     Priority: Low   Preoperative clearance 03/25/2014    Priority: Low   Solitary pulmonary nodule, on CT 02/2013 - stable over 2 years in 2015 02/20/2013    Priority: Low   GERD 08/21/2006  Priority: Low   Chronic pain of left wrist 04/19/2022    Priority: 1.   Facet arthropathy, cervical 06/29/2021    Priority: 1.   Foraminal stenosis of cervical region 06/29/2021    Priority: 1.   Preop cardiovascular exam 12/07/2021   Postural dizziness with presyncope 09/30/2020    Medications- reviewed and updated Current Outpatient Medications  Medication Sig Dispense Refill   albuterol (PROVENTIL) (2.5 MG/3ML) 0.083% nebulizer solution Take 3 mLs (2.5 mg total) by nebulization every 6 (six) hours as needed for wheezing or shortness of breath (Use 2-3 times per day when sick.). 75 mL 2   albuterol (VENTOLIN HFA) 108 (90 Base) MCG/ACT inhaler TAKE 2 PUFFS BY MOUTH EVERY 6 HOURS AS NEEDED FOR WHEEZE OR SHORTNESS OF BREATH 8.5 each 1   aspirin EC 81 MG tablet Take 1 tablet (81 mg total) by  mouth daily. 30 tablet 11   atorvastatin (LIPITOR) 40 MG tablet Take 1 tablet (40 mg total) by mouth daily. 90 tablet 3   BREO ELLIPTA 200-25 MCG/INH AEPB TAKE 1 PUFF BY MOUTH EVERY DAY (Patient taking differently: Inhale 1 puff into the lungs daily.) 60 each 5   buPROPion (WELLBUTRIN XL) 300 MG 24 hr tablet TAKE 1 TABLET BY MOUTH EVERY DAY 90 tablet 3   chlorpheniramine-HYDROcodone (TUSSIONEX) 10-8 MG/5ML Take 5 mLs by mouth at bedtime as needed for cough (do not drive for 8 hours after taking). 115 mL 0   Continuous Glucose Sensor (DEXCOM G7 SENSOR) MISC 1 Device by Does not apply route as directed. Change sensor every 10 days 3 each 3   diltiazem (CARDIZEM CD) 120 MG 24 hr capsule Take 1 capsule (120 mg total) by mouth daily. 90 capsule 3   estradiol (ESTRACE) 0.1 MG/GM vaginal cream Place 0.5 Applicatorfuls vaginally 2 (two) times a week.     fenofibrate 160 MG tablet TAKE 1 TABLET BY MOUTH EVERY DAY 90 tablet 1   fluticasone (FLONASE) 50 MCG/ACT nasal spray Place 2 sprays into both nostrils daily as needed for allergies. 48 mL 3   furosemide (LASIX) 40 MG tablet Take 1 tablet (40 mg total) by mouth daily.     gabapentin (NEURONTIN) 100 MG capsule TAKE 1 CAPSULE BY MOUTH THREE TIMES A DAY 270 capsule 3   glucose blood (CONTOUR NEXT TEST) test strip 1 each by Other route 4 (four) times daily. And lancets 4/day 400 each 3   insulin aspart (NOVOLOG FLEXPEN) 100 UNIT/ML FlexPen Inject 15 Units into the skin daily with supper. 15 mL 11   insulin glargine-yfgn (SEMGLEE, YFGN,) 100 UNIT/ML Pen Inject 16 Units into the skin every morning. 30 mL 2   Insulin Pen Needle (PEN NEEDLES) 32G X 4 MM MISC 1 each by Does not apply route 4 (four) times daily. E11.9 400 each 0   ipratropium (ATROVENT) 0.06 % nasal spray PLACE 2 SPRAYS INTO BOTH NOSTRILS 4 TIMES DAILY. 45 mL 3   KLOR-CON M20 20 MEQ tablet TAKE 1 & 1/2 TABLETS BY MOUTH TWICE A DAY 270 tablet 2   Lancet Devices (EASY MINI EJECT LANCING DEVICE) MISC  Use as directed 4 times daily to test blood sugar 1 each 3   LINZESS 290 MCG CAPS capsule TAKE 1 CAPSULE BY MOUTH DAILY BEFORE BREAKFAST. (Patient taking differently: as needed.) 30 capsule 3   metFORMIN (GLUCOPHAGE-XR) 500 MG 24 hr tablet TAKE 4 TABLETS BY MOUTH DAILY WITH BREAKFAST. 360 tablet 0   Microlet Lancets MISC 1 each by Does not  apply route 2 (two) times daily. E11.9 100 each 2   pantoprazole (PROTONIX) 40 MG tablet TAKE 1 TABLET BY MOUTH TWICE A DAY 180 tablet 1   telmisartan (MICARDIS) 40 MG tablet Take 1 tablet (40 mg total) by mouth daily. 90 tablet 3   tirzepatide (MOUNJARO) 5 MG/0.5ML Pen Inject 5 mg into the skin once a week. 2 mL 1   venlafaxine XR (EFFEXOR-XR) 75 MG 24 hr capsule TAKE 1 CAPSULE BY MOUTH TWICE A DAY 180 capsule 3   benzonatate (TESSALON PERLES) 100 MG capsule Take 1-2 capsules (100-200 mg total) by mouth 3 (three) times daily as needed for cough. (Patient not taking: Reported on 11/08/2023) 30 capsule 3   cyclobenzaprine (FLEXERIL) 5 MG tablet Take 1 tablet (5 mg total) by mouth 3 (three) times daily as needed for muscle spasms (or Headache). 30 tablet 0   guaiFENesin-dextromethorphan (ROBITUSSIN DM) 100-10 MG/5ML syrup Take 5 mLs by mouth every 4 (four) hours as needed for cough (chest congestion). (Patient not taking: Reported on 11/08/2023) 118 mL 0   ondansetron (ZOFRAN-ODT) 4 MG disintegrating tablet Take 1 tablet (4 mg total) by mouth every 8 (eight) hours as needed for nausea or vomiting. (Patient not taking: Reported on 11/08/2023) 20 tablet 0   No current facility-administered medications for this visit.     Objective:  BP 138/68   Pulse 83   Temp (!) 97.2 F (36.2 C)   Ht 5\' 6"  (1.676 m)   Wt 218 lb 3.2 oz (99 kg)   SpO2 95%   BMI 35.22 kg/m  Gen: NAD, resting comfortably CV: RRR no murmurs rubs or gallops Lungs: CTAB no crackles, wheeze, rhonchi Ext: no edema Skin: warm, dry     Assessment and Plan   # Fall S: Occurred last Tuesday-  tripped over dog onto cement floor covered with carpet. Larey Seat forward onto her left knee pimarily then onto left upper chest. Did not hit face. Did not hit face or have loss of consciousness. Chest was sore from fall but improving. Knee was sore- prior knee replacement but improving- does have slight effusion A/P: we discussed the importance of fall avoidance- has to be extra careful in the future particularly around animals. Offered x-ray but wants to hold off for now    # Depression/hot flashes S: Compliant with Wellbutrin 300 xr, Effexor xr 75mg   A/P: reports reasonable control/full remisison- continue current medications   # hypertension/hypertensive heart disease with chronic diastolic CHF/CKD stage III S: compliant with Lasix 40 mg daily (also potassium 20 meq), diltiazem extended release 120 mg,  telmisartan 20 mg--> 40 mg on 12/10/22 Renal function stable with creatinine 1.18 last check- creatinine cl of 57 BP Readings from Last 3 Encounters:  11/08/23 138/68  10/20/23 128/70  10/11/23 139/78  A/P: hypertension stable- continue current medicines on repeat- higher than usual- monitor at home CHF- stable- continue current medicines . No increased edema or weight Chronic kidney disease III stable- continue current medications  . Advised to avoid nsaids -is having some fiatuge- wants to check CBC to make sure anemia stbale and check TSH . No chest pain or shortness of breath with this  # hyperlipidemia #aortic atherosclerosis S:compliant with atorvastatin 40 mg, fenofibrate 160mg . Lab Results  Component Value Date   CHOL 151 10/20/2023   HDL 50 10/20/2023   LDLCALC 76 10/20/2023   LDLDIRECT 84.0 07/21/2021   TRIG 157 (H) 10/20/2023   CHOLHDL 3.0 10/20/2023  A/P: cholesterol close to ideal with  LDL at 76 and ideal at 70 and triglyceride(s) close to goal 150 or less at 157- continue current medications    #Sarcoidosis of lung- prior seen by [facility] S: prednisone/Obstructive chronic  bronchitis without exacerbation /Severe persistent asthma without complication - off long term prednisone due to osteoporosis S: Medication: every morning-  breo 200-25mg  1 puff every day.   - some cough recently but not as bad as some seasons. Tussionex available if needed A/P: pulmonary issues including sarcoidosis and asthma and chronic bronchitis (no known copd) are overall stable- continue current medications   #Type 2 diabetes mellitus/morbid obesity-sees Dr. Roosevelt Locks- just checked and stable at 6.6. continue follow up with endocrinology and metformin and Mounjaro also novolog sliding scale insulin plus 23 units of long acting insulin - neuropathy stable on gabapentin but bothersome.   Recommended follow up: Return in about 4 months (around 03/07/2024) for followup or sooner if needed.Schedule b4 you leave. Future Appointments  Date Time Provider Department Center  11/09/2023  9:30 AM [facility]  12/01/2023  9:20 AM Altamese , MD [facility] None  02/20/2024 11:00 AM Parrett, Virgel Bouquet, NP [facility] None  03/20/2024 11:45 AM [facility] ANNUAL WELLNESS VISIT 1 [facility]   Lab/Order associations:   ICD-10-CM   1. Chronic diastolic CHF (congestive heart failure) (HCC) Chronic I50.32     2. Type 2 diabetes mellitus with stage 3a chronic kidney disease, with long-term current use of insulin (HCC) Chronic E11.22    N18.31    Z79.4     3. Severe persistent asthma without complication  J45.50     4. Sarcoidosis of lung (HCC)  D86.0     5. Hyperlipidemia associated with type 2 diabetes mellitus (HCC)  E11.69 CBC with Differential/Platelet   E78.5 TSH    6. Hypertensive heart disease with chronic diastolic congestive heart failure (HCC)  I11.0    I50.32       No orders of the defined types were placed in this encounter.   Return precautions advised.  Tana Conch, MD

## 2023-11-09 ENCOUNTER — Other Ambulatory Visit: Payer: Medicare Other

## 2023-11-16 DIAGNOSIS — S8002XA Contusion of left knee, initial encounter: Secondary | ICD-10-CM | POA: Diagnosis not present

## 2023-12-01 ENCOUNTER — Ambulatory Visit: Payer: Medicare Other | Admitting: "Endocrinology

## 2023-12-05 ENCOUNTER — Encounter: Payer: Self-pay | Admitting: Physician Assistant

## 2023-12-05 ENCOUNTER — Ambulatory Visit (INDEPENDENT_AMBULATORY_CARE_PROVIDER_SITE_OTHER): Payer: 59 | Admitting: Physician Assistant

## 2023-12-05 VITALS — BP 128/70 | HR 79 | Temp 97.5°F | Ht 66.0 in | Wt 214.0 lb

## 2023-12-05 DIAGNOSIS — J329 Chronic sinusitis, unspecified: Secondary | ICD-10-CM

## 2023-12-05 DIAGNOSIS — B9689 Other specified bacterial agents as the cause of diseases classified elsewhere: Secondary | ICD-10-CM | POA: Diagnosis not present

## 2023-12-05 DIAGNOSIS — R1084 Generalized abdominal pain: Secondary | ICD-10-CM | POA: Diagnosis not present

## 2023-12-05 DIAGNOSIS — R350 Frequency of micturition: Secondary | ICD-10-CM

## 2023-12-05 LAB — COMPREHENSIVE METABOLIC PANEL
ALT: 23 U/L (ref 0–35)
AST: 24 U/L (ref 0–37)
Albumin: 4.5 g/dL (ref 3.5–5.2)
Alkaline Phosphatase: 55 U/L (ref 39–117)
BUN: 26 mg/dL — ABNORMAL HIGH (ref 6–23)
CO2: 28 meq/L (ref 19–32)
Calcium: 9.7 mg/dL (ref 8.4–10.5)
Chloride: 101 meq/L (ref 96–112)
Creatinine, Ser: 1.21 mg/dL — ABNORMAL HIGH (ref 0.40–1.20)
GFR: 47.42 mL/min — ABNORMAL LOW (ref >60.00)
Glucose, Bld: 148 mg/dL — ABNORMAL HIGH (ref 70–99)
Potassium: 4.9 meq/L (ref 3.5–5.1)
Sodium: 137 meq/L (ref 135–145)
Total Bilirubin: 0.4 mg/dL (ref 0.2–1.2)
Total Protein: 7.1 g/dL (ref 6.0–8.3)

## 2023-12-05 LAB — UNLABELED: Test Ordered On Req: 395

## 2023-12-05 LAB — POCT URINALYSIS DIPSTICK
Bilirubin, UA: NEGATIVE
Blood, UA: NEGATIVE
Glucose, UA: NEGATIVE
Ketones, UA: NEGATIVE
Leukocytes, UA: NEGATIVE
Nitrite, UA: NEGATIVE
Protein, UA: NEGATIVE
Spec Grav, UA: 1.02 (ref 1.010–1.025)
Urobilinogen, UA: 0.2 U/dL
pH, UA: 6 (ref 5.0–8.0)

## 2023-12-05 LAB — CBC WITH DIFFERENTIAL/PLATELET
Basophils Absolute: 0.1 10*3/uL (ref 0.0–0.1)
Basophils Relative: 0.6 % (ref 0.0–3.0)
Eosinophils Absolute: 0.4 10*3/uL (ref 0.0–0.7)
Eosinophils Relative: 3.5 % (ref 0.0–5.0)
HCT: 33.2 % — ABNORMAL LOW (ref 36.0–46.0)
Hemoglobin: 11 g/dL — ABNORMAL LOW (ref 12.0–15.0)
Lymphocytes Relative: 25.3 % (ref 12.0–46.0)
Lymphs Abs: 2.9 10*3/uL (ref 0.7–4.0)
MCHC: 33.3 g/dL (ref 30.0–36.0)
MCV: 90 fL (ref 78.0–100.0)
Monocytes Absolute: 0.6 10*3/uL (ref 0.1–1.0)
Monocytes Relative: 5.4 % (ref 3.0–12.0)
Neutro Abs: 7.4 10*3/uL (ref 1.4–7.7)
Neutrophils Relative %: 65.2 % (ref 43.0–77.0)
Platelets: 343 10*3/uL (ref 150.0–400.0)
RBC: 3.68 Mil/uL — ABNORMAL LOW (ref 3.87–5.11)
RDW: 13.1 % (ref 11.5–15.5)
WBC: 11.3 10*3/uL — ABNORMAL HIGH (ref 4.0–10.5)

## 2023-12-05 MED ORDER — AMOXICILLIN-POT CLAVULANATE 875-125 MG PO TABS: 1.0000 | ORAL_TABLET | Freq: Two times a day (BID) | ORAL | 0 refills | Status: DC

## 2023-12-05 NOTE — Patient Instructions (Signed)
 It was great to see you!  Hold metformin while having diarrhea  Push fluids and focus on bland diet  Start antibiotic(s) for your sinus symptom(s)  If ANY change to symptom(s), please let me know   Take care,  Jarold Motto PA-C

## 2023-12-05 NOTE — Progress Notes (Signed)
 [name] is a 63 y.o. female here for a new problem.  History of Present Illness:   Chief Complaint  Patient presents with   Sinus Problem    Pt c/o sinus pressure, headache, green nasal drainage, x 1 week. Denies fever or chills   Back Pain    Pt c/o low back pain x several days, radiated down right hip and upper thigh, has been using heating pad. Also having urinary frequency.    Sinus Problem Associated symptoms include coughing, headaches and a sore throat. Pertinent negatives include no chills.  Back Pain Associated symptoms include abdominal pain and headaches. Pertinent negatives include no dysuria or fever.   Sinus Pain Patient complains of sinus pressure that has persisted for the past week. Associated symptoms include headaches, green nasal drainage, and abdominal cramps, and a mild sore throat and cough. She also reports experiencing diarrhea that started this morning.  Reports taking tylenol and inhaler's without much relief.  Denies any fever or chills.  No known exposure to any viral sickness.   Back Pain  Patient also complains of right lower back pain that has persisted for the past several days.  Pain tends to radiate to her right hip and upper thigh.  Associated symptoms include increased urinary frequency.  Does report having similar pain when having a kidney infection. Urine analysis was normal today.   Denies any dysuria.    Diabetes  She is currently on Mounjaro 5 mg without any side effects. Doesn't believe this is contributing to her current symptoms. No recent dose increase or change.  No concerns or symptoms at this time.  Past Medical History:  Diagnosis Date   Abnormal SPEP 04/17/2014   Acute left ankle pain 01/26/2017   ANEMIA-UNSPECIFIED 09/18/2009   Anxiety    Arthritis    Blood transfusion without reported diagnosis    Cataract    CHF (congestive heart failure) (HCC)    Chronic diastolic heart failure, NYHA class 2 (HCC)    Normal  LVEDP by May 2018   COPD (chronic obstructive pulmonary disease) (HCC)    Depression    DIABETES MELLITUS, TYPE II 08/21/2006   Diabetic osteomyelitis (HCC) 05/29/2015   Fatty liver    Fracture of 5th metatarsal    non union   GERD 08/21/2006   Had fundoplication   GOUT 08/20/2010   Hammer toe of right foot    2-5th toes   Hx of umbilical hernia repair    HYPERLIPIDEMIA 08/21/2006   HYPERTENSION 08/21/2006   Infection of wound due to methicillin resistant Staphylococcus aureus (MRSA)    Internal hemorrhoids    Kidney problem    Multiple allergies 10/14/2016   OBESITY 06/04/2009   Onychomycosis 10/27/2015   Osteomyelitis of left foot (HCC) 05/29/2015   Osteoporosis    Pulmonary sarcoidosis (HCC)    Followed locally by pulmonology, but also by Dr. Sandy Sal[facility]am at [facility]   Right knee pain 01/26/2017   Vocal cord dysfunction    Wears partial dentures      Social History   Tobacco Use   Smoking status: Never   Smokeless tobacco: Never  Vaping Use   Vaping status: Never Used  Substance Use Topics   Alcohol use: No    Alcohol/week: 0.0 standard drinks of alcohol   Drug use: No    Past Surgical History:  Procedure Laterality Date   ABDOMINAL HYSTERECTOMY     APPENDECTOMY     BLADDER SUSPENSION  11/11/2011  Procedure: TRANSVAGINAL TAPE (TVT) PROCEDURE;  Surgeon: Levi Aland, MD;  Location: [facility];  Service: Gynecology;  Laterality: N/A;   CAROTIDS  02/18/2011   CAROTID DUPLEX; VERTEBRALS ARE PATENT WITH ANTEGRADE FLOW. ICA/CCA RATIO 1.61 ON RIGHT AND 0.75 ON LEFT   CATARACT EXTRACTION, BILATERAL     CHOLECYSTECTOMY  1984   COLONOSCOPY  04/29/2010   Marina Goodell   CYSTOSCOPY  11/11/2011   Procedure: CYSTOSCOPY;  Surgeon: Levi Aland, MD;  Location: [facility];  Service: Gynecology;  Laterality: N/A;   EXTUBATION (ENDOTRACHEAL) IN OR N/A 09/23/2017   Procedure: EXTUBATION (ENDOTRACHEAL) IN OR;  Surgeon: Graylin Shiver, MD;  Location: [facility];  Service: ENT;   Laterality: N/A;   FIBEROPTIC LARYNGOSCOPY AND TRACHEOSCOPY N/A 09/23/2017   Procedure: FLEXIBLE FIBEROPTIC LARYNGOSCOPY;  Surgeon: Graylin Shiver, MD;  Location: [facility];  Service: ENT;  Laterality: N/A;   FRACTURE SURGERY     foot   HALLUX FUSION Left 10/02/2018   Procedure: HALLUX ARTHRODESIS;  Surgeon: Felecia Shelling, DPM;  Location: [facility];  Service: Podiatry;  Laterality: Left;   HAMMER TOE SURGERY Left 10/02/2018   Procedure: HAMMER TOE CORRECTION 2ND 3RD 4RD FIFTH TOE;  Surgeon: Felecia Shelling, DPM;  Location: [facility];  Service: Podiatry;  Laterality: Left;   HAMMER TOE SURGERY Right 04/12/2019   Procedure: HAMMER TOE CORRECTION, 2ND, 3RD, 4TH AND 5TH TOES OF RIGHT FOOT;  Surgeon: Felecia Shelling, DPM;  Location: [facility];  Service: Podiatry;  Laterality: Right;   HAMMER TOE SURGERY Left 10/25/2019   Procedure: HAMMER TOE REPAIR SECOND, THIRD, FOUTH;  Surgeon: Felecia Shelling, DPM;  Location: [facility];  Service: Podiatry;  Laterality: Left;   HERNIA REPAIR     I & D EXTREMITY Left 06/27/2015   Procedure: Partial Excision Left Calcaneus, Place Antibiotic Beads, and Wound VAC;  Surgeon: Nadara Mustard, MD;  Location: [facility];  Service: Orthopedics;  Laterality: Left;   JOINT REPLACEMENT     KNEE ARTHROSCOPY     right   LEFT AND RIGHT HEART CATHETERIZATION WITH CORONARY ANGIOGRAM N/A 04/23/2013   Procedure: LEFT AND RIGHT HEART CATHETERIZATION WITH CORONARY ANGIOGRAM;  Surgeon: Marykay Lex, MD;  Location[facility] CATH LAB;  Service: Cardiovascular;  Laterality: N/A;   LEFT HEART CATH AND CORONARY ANGIOGRAPHY N/A 03/11/2017   Procedure: Left Heart Cath and Coronary Angiography;  Surgeon: Tonny Bollman, MD;  Location: [facility] INVASIVE CV LAB; angiographically minimal CAD in the LAD otherwise normal.;  Normal LVEDP.  FALSE POSITIVE MYOVIEW   LEFT HEART CATH AND CORONARY ANGIOGRAPHY  07/20/2010   LVEF 50-55% WITH VERY MILD GLOBAL HYPOKINESIA; ESSENTIALLY NORMAL CORONARY ARTERIES; NORMAL LV FUNCTION    METATARSAL OSTEOTOMY WITH OPEN REDUCTION INTERNAL FIXATION (ORIF) METATARSAL WITH FUSION Left 04/09/2014   Procedure: LEFT FOOT FRACTURE OPEN TREATMENT METATARSAL INCLUDES INTERNAL FIXATION EACH;  Surgeon: Nilda Simmer, MD;  Location: [facility];  Service: Orthopedics;  Laterality: Left;   NISSEN FUNDOPLICATION  2004   NM MYOVIEW LTD --> FALSE POSITIVE  03/10/2017   : Moderate size "stress-induced "perfusion defect at the apex as well as "ill-defined stress-induced perfusion defect in the lateral wall.  EF 55%.  INTERMEDIATE risk. -->  FALSE POSITIVE   ORIF DISTAL FEMUR FRACTURE  08/08/2020   [facility]: ORIF of left extra-articular periprosthetic distal femur fracture with synthesis of the femur plate with cortical nonlocking screws.  (Thought to be pathologic fracture due to osteoporosis) -> after a  fall   ORIF FEMUR W/ PERI-IMPLANT  08/29/2020   [facility]) revision of left femur fracture ORIF plate fixation screws; culture positive for MSSA   Right and left CARDIAC CATHETERIZATION  04/23/2013   Angiographic normal coronaries; LVEDP 20 mmHg, PCWP 12-14 mmHg, RAP 12 mmHg.; Fick CO/CI 4.9/2.2   ROTATOR CUFF REPAIR Right 12/09/2021   SHOULDER ARTHROSCOPY Left 03/14/2019   Procedure: LEFT SHOULDER ARTHROSCOPY, DEBRIDEMENT, AND DECOMPRESSION;  Surgeon: Nadara Mustard, MD;  Location: [facility];  Service: Orthopedics;  Laterality: Left;   TOTAL KNEE ARTHROPLASTY Right 06/29/2017   Procedure: RIGHT TOTAL KNEE ARTHROPLASTY;  Surgeon: Nadara Mustard, MD;  Location: [facility] OR;  Service: Orthopedics;  Laterality: Right;   TOTAL KNEE ARTHROPLASTY Left 12/07/2017   TOTAL KNEE ARTHROPLASTY Left 12/07/2017   Procedure: LEFT TOTAL KNEE ARTHROPLASTY;  Surgeon: Nadara Mustard, MD;  Location: [facility] OR;  Service: Orthopedics;  Laterality: Left;   TRACHEOSTOMY TUBE PLACEMENT N/A 09/20/2017   Procedure: AWAKE INTUBATION WITH ANESTHESIA WITH VIDEO ASSISTANCE;  Surgeon: Graylin Shiver, MD;  Location: [facility];  Service: ENT;  Laterality: N/A;   TRANSTHORACIC ECHOCARDIOGRAM  08/2014   Normal LV size and function.  Mild LVH.  EF 55-60%.  Normal regional wall motion.  GR 1 DD.  Normal RV size and function .   TRANSTHORACIC ECHOCARDIOGRAM  08/12/2020    [facility]): Normal LV size and function.  EF 55 to 60%.  No RWM A.  Normal filling pattern.  Normal RV.  No significant valvular disease-stenosis or regurgitation.  No vegetation.   TUBAL LIGATION     with reversal in 1994   UPPER GASTROINTESTINAL ENDOSCOPY     VENTRAL HERNIA REPAIR      Family History  Problem Relation Age of Onset   Diabetes Father    Heart attack Father 68   Coronary artery disease Father    Heart failure Father    Throat cancer Father    Hypertension Father    Heart disease Father    Sleep apnea Father    Obesity Father    COPD Mother    Emphysema Mother    Asthma Mother    Heart failure Mother    Breast cancer Mother    Diabetes Mother    Kidney disease Mother    Thyroid disease Mother    Heart attack Maternal Grandfather    Sarcoidosis Maternal Uncle    Lung cancer Brother    Diabetes Brother    Colon cancer Neg Hx    Colon polyps Neg Hx    Esophageal cancer Neg Hx    Rectal cancer Neg Hx    Stomach cancer Neg Hx     Allergies  Allergen Reactions   Firvanq [Vancomycin] Other (See Comments)    Dose related nephrotoxicity    Trexall [Methotrexate] Other (See Comments)    Peri-oral and buccal lesions   Zestril [Lisinopril] Cough   Chlorhexidine Itching   Cleocin [Clindamycin] Nausea And Vomiting and Rash   Lincocin [Lincomycin Hcl] Nausea And Vomiting and Rash   Teflaro [Ceftaroline] Rash and Other (See Comments)    Tolerates ceftriaxone     Current Medications:   Current Outpatient Medications:    albuterol (PROVENTIL) (2.5 MG/3ML) 0.083% nebulizer solution, Take 3 mLs (2.5 mg total) by nebulization every 6 (six) hours as needed for wheezing or shortness  of breath (Use 2-3 times per day when sick.)., Disp: 75 mL, Rfl: 2   albuterol (VENTOLIN HFA)  108 (90 Base) MCG/ACT inhaler, TAKE 2 PUFFS BY MOUTH EVERY 6 HOURS AS NEEDED FOR WHEEZE OR SHORTNESS OF BREATH, Disp: 8.5 each, Rfl: 1   aspirin EC 81 MG tablet, Take 1 tablet (81 mg total) by mouth daily., Disp: 30 tablet, Rfl: 11   atorvastatin (LIPITOR) 40 MG tablet, Take 1 tablet (40 mg total) by mouth daily., Disp: 90 tablet, Rfl: 3   benzonatate (TESSALON PERLES) 100 MG capsule, Take 1-2 capsules (100-200 mg total) by mouth 3 (three) times daily as needed for cough. (Patient not taking: Reported on 11/08/2023), Disp: 30 capsule, Rfl: 3   BREO ELLIPTA 200-25 MCG/INH AEPB, TAKE 1 PUFF BY MOUTH EVERY DAY (Patient taking differently: Inhale 1 puff into the lungs daily.), Disp: 60 each, Rfl: 5   buPROPion (WELLBUTRIN XL) 300 MG 24 hr tablet, TAKE 1 TABLET BY MOUTH EVERY DAY, Disp: 90 tablet, Rfl: 3   chlorpheniramine-HYDROcodone (TUSSIONEX) 10-8 MG/5ML, Take 5 mLs by mouth at bedtime as needed for cough (do not drive for 8 hours after taking)., Disp: 115 mL, Rfl: 0   Continuous Glucose Sensor (DEXCOM G7 SENSOR) MISC, 1 Device by Does not apply route as directed. Change sensor every 10 days, Disp: 3 each, Rfl: 3   cyclobenzaprine (FLEXERIL) 5 MG tablet, Take 1 tablet (5 mg total) by mouth 3 (three) times daily as needed for muscle spasms (or Headache)., Disp: 30 tablet, Rfl: 0   diltiazem (CARDIZEM CD) 120 MG 24 hr capsule, Take 1 capsule (120 mg total) by mouth daily., Disp: 90 capsule, Rfl: 3   estradiol (ESTRACE) 0.1 MG/GM vaginal cream, Place 0.5 Applicatorfuls vaginally 2 (two) times a week., Disp: , Rfl:    fenofibrate 160 MG tablet, TAKE 1 TABLET BY MOUTH EVERY DAY, Disp: 90 tablet, Rfl: 1   fluticasone (FLONASE) 50 MCG/ACT nasal spray, Place 2 sprays into both nostrils daily as needed for allergies., Disp: 48 mL, Rfl: 3   furosemide (LASIX) 40 MG tablet, Take 1 tablet (40 mg total) by mouth daily.,  Disp: , Rfl:    gabapentin (NEURONTIN) 100 MG capsule, TAKE 1 CAPSULE BY MOUTH THREE TIMES A DAY, Disp: 270 capsule, Rfl: 3   glucose blood (CONTOUR NEXT TEST) test strip, 1 each by Other route 4 (four) times daily. And lancets 4/day, Disp: 400 each, Rfl: 3   guaiFENesin-dextromethorphan (ROBITUSSIN DM) 100-10 MG/5ML syrup, Take 5 mLs by mouth every 4 (four) hours as needed for cough (chest congestion). (Patient not taking: Reported on 11/08/2023), Disp: 118 mL, Rfl: 0   insulin aspart (NOVOLOG FLEXPEN) 100 UNIT/ML FlexPen, Inject 15 Units into the skin daily with supper., Disp: 15 mL, Rfl: 11   insulin glargine-yfgn (SEMGLEE, YFGN,) 100 UNIT/ML Pen, Inject 16 Units into the skin every morning., Disp: 30 mL, Rfl: 2   Insulin Pen Needle (PEN NEEDLES) 32G X 4 MM MISC, 1 each by Does not apply route 4 (four) times daily. E11.9, Disp: 400 each, Rfl: 0   ipratropium (ATROVENT) 0.06 % nasal spray, PLACE 2 SPRAYS INTO BOTH NOSTRILS 4 TIMES DAILY., Disp: 45 mL, Rfl: 3   KLOR-CON M20 20 MEQ tablet, TAKE 1 & 1/2 TABLETS BY MOUTH TWICE A DAY, Disp: 270 tablet, Rfl: 2   Lancet Devices (EASY MINI EJECT LANCING DEVICE) MISC, Use as directed 4 times daily to test blood sugar, Disp: 1 each, Rfl: 3   LINZESS 290 MCG CAPS capsule, TAKE 1 CAPSULE BY MOUTH DAILY BEFORE BREAKFAST. (Patient taking differently: as needed.), Disp: 30 capsule, Rfl: 3  metFORMIN (GLUCOPHAGE-XR) 500 MG 24 hr tablet, TAKE 4 TABLETS BY MOUTH DAILY WITH BREAKFAST., Disp: 360 tablet, Rfl: 0   Microlet Lancets MISC, 1 each by Does not apply route 2 (two) times daily. E11.9, Disp: 100 each, Rfl: 2   ondansetron (ZOFRAN-ODT) 4 MG disintegrating tablet, Take 1 tablet (4 mg total) by mouth every 8 (eight) hours as needed for nausea or vomiting. (Patient not taking: Reported on 11/08/2023), Disp: 20 tablet, Rfl: 0   pantoprazole (PROTONIX) 40 MG tablet, TAKE 1 TABLET BY MOUTH TWICE A DAY, Disp: 180 tablet, Rfl: 1   telmisartan (MICARDIS) 40 MG tablet,  Take 1 tablet (40 mg total) by mouth daily., Disp: 90 tablet, Rfl: 3   tirzepatide (MOUNJARO) 5 MG/0.5ML Pen, Inject 5 mg into the skin once a week., Disp: 2 mL, Rfl: 1   venlafaxine XR (EFFEXOR-XR) 75 MG 24 hr capsule, TAKE 1 CAPSULE BY MOUTH TWICE A DAY, Disp: 180 capsule, Rfl: 3   Review of Systems:   Review of Systems  Constitutional:  Negative for chills and fever.  HENT:  Positive for sinus pain and sore throat.   Respiratory:  Positive for cough and sputum production.   Gastrointestinal:  Positive for abdominal pain and diarrhea.  Genitourinary:  Positive for frequency. Negative for dysuria.  Musculoskeletal:  Positive for back pain.  Neurological:  Positive for headaches.    Vitals:   There were no vitals filed for this visit.   There is no height or weight on file to calculate BMI.  Physical Exam:   Physical Exam  Assessment and Plan:   There are no diagnoses linked to this encounter.   Jarold Motto, PA-C  I,Safa M Kadhim,acting as a scribe for Jarold Motto, PA.,have documented all relevant documentation on the behalf of Jarold Motto, PA,as directed by  Jarold Motto, PA while in the presence of Jarold Motto, [place].   I, Jarold Motto, [place], have reviewed all documentation for this visit. The documentation on 12/05/23 for the exam, diagnosis, procedures, and orders are all accurate and complete.

## 2023-12-06 ENCOUNTER — Encounter: Payer: Self-pay | Admitting: Physician Assistant

## 2023-12-06 NOTE — Telephone Encounter (Signed)
 See result notes. Patient called.

## 2023-12-07 ENCOUNTER — Ambulatory Visit: Payer: Medicare Other

## 2023-12-07 ENCOUNTER — Other Ambulatory Visit: Payer: Self-pay | Admitting: "Endocrinology

## 2023-12-07 DIAGNOSIS — M2141 Flat foot [pes planus] (acquired), right foot: Secondary | ICD-10-CM

## 2023-12-07 DIAGNOSIS — M2140 Flat foot [pes planus] (acquired), unspecified foot: Secondary | ICD-10-CM

## 2023-12-07 DIAGNOSIS — E0843 Diabetes mellitus due to underlying condition with diabetic autonomic (poly)neuropathy: Secondary | ICD-10-CM

## 2023-12-07 DIAGNOSIS — E1165 Type 2 diabetes mellitus with hyperglycemia: Secondary | ICD-10-CM

## 2023-12-07 DIAGNOSIS — M2041 Other hammer toe(s) (acquired), right foot: Secondary | ICD-10-CM

## 2023-12-07 NOTE — Telephone Encounter (Signed)
 Medication refill request complete

## 2023-12-07 NOTE — Progress Notes (Signed)
 Patient presents to the office today for diabetic shoe and insole measuring.  Patient was measured with brannock device to determine size and width for 1 pair of extra depth shoes and foam casted for 3 pair of insoles.   Documentation of medical necessity will be sent to patient's treating diabetic doctor to verify and sign.   Patient's diabetic provider: Altamese Orviston MD   Shoes and insoles will be ordered at that time and patient will be notified for an appointment for fitting when they arrive.   Shoe size (per patient): 10 Brannock measurement: 10 Shoe choice:   New bal purple / New Bal Lilac Shoe size ordered: 10WD  Ppw ABN signed

## 2023-12-09 ENCOUNTER — Encounter: Payer: Self-pay | Admitting: Physician Assistant

## 2023-12-09 LAB — PAT ID TIQ DOC: Test Affected: 395

## 2023-12-09 LAB — URINE CULTURE
MICRO NUMBER:: 16137716
SPECIMEN QUALITY:: ADEQUATE

## 2023-12-17 ENCOUNTER — Other Ambulatory Visit: Payer: Self-pay | Admitting: Family Medicine

## 2023-12-26 ENCOUNTER — Other Ambulatory Visit: Payer: Self-pay | Admitting: "Endocrinology

## 2024-01-02 ENCOUNTER — Encounter: Payer: Self-pay | Admitting: Physician Assistant

## 2024-01-02 ENCOUNTER — Ambulatory Visit (INDEPENDENT_AMBULATORY_CARE_PROVIDER_SITE_OTHER): Admitting: Physician Assistant

## 2024-01-02 VITALS — BP 130/66 | HR 85 | Temp 97.4°F | Ht 66.0 in | Wt 209.2 lb

## 2024-01-02 DIAGNOSIS — R059 Cough, unspecified: Secondary | ICD-10-CM

## 2024-01-02 DIAGNOSIS — J029 Acute pharyngitis, unspecified: Secondary | ICD-10-CM

## 2024-01-02 LAB — POC COVID19 BINAXNOW: SARS Coronavirus 2 Ag: NEGATIVE

## 2024-01-02 LAB — POCT RAPID STREP A (OFFICE): Rapid Strep A Screen: NEGATIVE

## 2024-01-02 MED ORDER — BENZONATATE 100 MG PO CAPS: 100.0000 mg | ORAL_CAPSULE | Freq: Three times a day (TID) | ORAL | 3 refills | Status: DC | PRN

## 2024-01-02 MED ORDER — DOXYCYCLINE HYCLATE 100 MG PO CAPS: 100.0000 mg | ORAL_CAPSULE | Freq: Two times a day (BID) | ORAL | 0 refills | Status: DC

## 2024-01-02 MED ORDER — ALBUTEROL SULFATE HFA 108 (90 BASE) MCG/ACT IN AERS: 1.0000 | INHALATION_SPRAY | RESPIRATORY_TRACT | 1 refills | Status: DC | PRN

## 2024-01-02 NOTE — Patient Instructions (Signed)
 It was great to see you!  Start doxycycline for your sinus infection  I have refilled your tessalon Perles and albuterol inhaler  Let's follow-up as needed if symptom(s) worsen or do not improve, sooner if you have concerns.  Take care,  Jarold Motto PA-C

## 2024-01-02 NOTE — Progress Notes (Signed)
 [name] is a 63 y.o. female here for a new problem.  History of Present Illness:   Chief Complaint  Patient presents with   Sore Throat    Pt c/o dry cough, sore throat, headache, started on Friday.    HPI  Sore throat: Pt complains of a sore throat, dry cough, and headaches  starting Friday 3/21.  She also endorses frontal sinus pressure and around her eyes.  Has been taking Mucinex at home, which has not helped.  Has had to use both her rescue inhaler and nebulizer about 1-2 times per day.  Her sinus pain has improved since she was last seen on 12/05/23.  Denies any recent travel.   Past Medical History:  Diagnosis Date   Abnormal SPEP 04/17/2014   Acute left ankle pain 01/26/2017   ANEMIA-UNSPECIFIED 09/18/2009   Anxiety    Arthritis    Blood transfusion without reported diagnosis    Cataract    CHF (congestive heart failure) (HCC)    Chronic diastolic heart failure, NYHA class 2 (HCC)    Normal LVEDP by May 2018   COPD (chronic obstructive pulmonary disease) (HCC)    Depression    DIABETES MELLITUS, TYPE II 08/21/2006   Diabetic osteomyelitis (HCC) 05/29/2015   Fatty liver    Fracture of 5th metatarsal    non union   GERD 08/21/2006   Had fundoplication   GOUT 08/20/2010   Hammer toe of right foot    2-5th toes   Hx of umbilical hernia repair    HYPERLIPIDEMIA 08/21/2006   HYPERTENSION 08/21/2006   Infection of wound due to methicillin resistant Staphylococcus aureus (MRSA)    Internal hemorrhoids    Kidney problem    Multiple allergies 10/14/2016   OBESITY 06/04/2009   Onychomycosis 10/27/2015   Osteomyelitis of left foot (HCC) 05/29/2015   Osteoporosis    Pulmonary sarcoidosis (HCC)    Followed locally by pulmonology, but also by Dr. Sandy Salaam at [facility]   Right knee pain 01/26/2017   Vocal cord dysfunction    Wears partial dentures      Social History   Tobacco Use   Smoking status: Never   Smokeless tobacco: Never  Vaping Use    Vaping status: Never Used  Substance Use Topics   Alcohol use: No    Alcohol/week: 0.0 standard drinks of alcohol   Drug use: No    Past Surgical History:  Procedure Laterality Date   ABDOMINAL HYSTERECTOMY     APPENDECTOMY     BLADDER SUSPENSION  11/11/2011   Procedure: TRANSVAGINAL TAPE (TVT) PROCEDURE;  Surgeon: Levi Aland, MD;  Location: [facility];  Service: Gynecology;  Laterality: N/A;   CAROTIDS  02/18/2011   CAROTID DUPLEX; VERTEBRALS ARE PATENT WITH ANTEGRADE FLOW. ICA/CCA RATIO 1.61 ON RIGHT AND 0.75 ON LEFT   CATARACT EXTRACTION, BILATERAL     CHOLECYSTECTOMY  1984   COLONOSCOPY  04/29/2010   Marina Goodell   CYSTOSCOPY  11/11/2011   Procedure: CYSTOSCOPY;  Surgeon: Levi Aland, MD;  Location: [facility];  Service: Gynecology;  Laterality: N/A;   EXTUBATION (ENDOTRACHEAL) IN OR N/A 09/23/2017   Procedure: EXTUBATION (ENDOTRACHEAL) IN OR;  Surgeon: Graylin Shiver, MD;  Location: [facility];  Service: ENT;  Laterality: N/A;   FIBEROPTIC LARYNGOSCOPY AND TRACHEOSCOPY N/A 09/23/2017   Procedure: FLEXIBLE FIBEROPTIC LARYNGOSCOPY;  Surgeon: Graylin Shiver, MD;  Location: [facility];  Service: ENT;  Laterality: N/A;   FRACTURE SURGERY  foot   HALLUX FUSION Left 10/02/2018   Procedure: HALLUX ARTHRODESIS;  Surgeon: Felecia Shelling, DPM;  Location: [facility];  Service: Podiatry;  Laterality: Left;   HAMMER TOE SURGERY Left 10/02/2018   Procedure: HAMMER TOE CORRECTION 2ND 3RD 4RD FIFTH TOE;  Surgeon: Felecia Shelling, DPM;  Location: [facility];  Service: Podiatry;  Laterality: Left;   HAMMER TOE SURGERY Right 04/12/2019   Procedure: HAMMER TOE CORRECTION, 2ND, 3RD, 4TH AND 5TH TOES OF RIGHT FOOT;  Surgeon: Felecia Shelling, DPM;  Location: [facility];  Service: Podiatry;  Laterality: Right;   HAMMER TOE SURGERY Left 10/25/2019   Procedure: HAMMER TOE REPAIR SECOND, THIRD, FOUTH;  Surgeon: Felecia Shelling, DPM;  Location: [facility];  Service: Podiatry;  Laterality: Left;   HERNIA REPAIR     I & D  EXTREMITY Left 06/27/2015   Procedure: Partial Excision Left Calcaneus, Place Antibiotic Beads, and Wound VAC;  Surgeon: Nadara Mustard, MD;  Location: [facility];  Service: Orthopedics;  Laterality: Left;   JOINT REPLACEMENT     KNEE ARTHROSCOPY     right   LEFT AND RIGHT HEART CATHETERIZATION WITH CORONARY ANGIOGRAM N/A 04/23/2013   Procedure: LEFT AND RIGHT HEART CATHETERIZATION WITH CORONARY ANGIOGRAM;  Surgeon: Marykay Lex, MD;  Location: [facility] CATH LAB;  Service: Cardiovascular;  Laterality: N/A;   LEFT HEART CATH AND CORONARY ANGIOGRAPHY N/A 03/11/2017   Procedure: Left Heart Cath and Coronary Angiography;  Surgeon: Tonny Bollman, MD;  Location: [facility] INVASIVE CV LAB; angiographically minimal CAD in the LAD otherwise normal.;  Normal LVEDP.  FALSE POSITIVE MYOVIEW   LEFT HEART CATH AND CORONARY ANGIOGRAPHY  07/20/2010   LVEF 50-55% WITH VERY MILD GLOBAL HYPOKINESIA; ESSENTIALLY NORMAL CORONARY ARTERIES; NORMAL LV FUNCTION   METATARSAL OSTEOTOMY WITH OPEN REDUCTION INTERNAL FIXATION (ORIF) METATARSAL WITH FUSION Left 04/09/2014   Procedure: LEFT FOOT FRACTURE OPEN TREATMENT METATARSAL INCLUDES INTERNAL FIXATION EACH;  Surgeon: Nilda Simmer, MD;  Location: [facility];  Service: Orthopedics;  Laterality: Left;   NISSEN FUNDOPLICATION  2004   NM MYOVIEW LTD --> FALSE POSITIVE  03/10/2017   : Moderate size "stress-induced "perfusion defect at the apex as well as "ill-defined stress-induced perfusion defect in the lateral wall.  EF 55%.  INTERMEDIATE risk. -->  FALSE POSITIVE   ORIF DISTAL FEMUR FRACTURE  08/08/2020   [facility]: ORIF of left extra-articular periprosthetic distal femur fracture with synthesis of the femur plate with cortical nonlocking screws.  (Thought to be pathologic fracture due to osteoporosis) -> after a fall   ORIF FEMUR W/ PERI-IMPLANT  08/29/2020   [facility]) revision of left femur fracture ORIF plate fixation screws; culture  positive for MSSA   Right and left CARDIAC CATHETERIZATION  04/23/2013   Angiographic normal coronaries; LVEDP 20 mmHg, PCWP 12-14 mmHg, RAP 12 mmHg.; Fick CO/CI 4.9/2.2   ROTATOR CUFF REPAIR Right 12/09/2021   SHOULDER ARTHROSCOPY Left 03/14/2019   Procedure: LEFT SHOULDER ARTHROSCOPY, DEBRIDEMENT, AND DECOMPRESSION;  Surgeon: Nadara Mustard, MD;  Location: [facility];  Service: Orthopedics;  Laterality: Left;   TOTAL KNEE ARTHROPLASTY Right 06/29/2017   Procedure: RIGHT TOTAL KNEE ARTHROPLASTY;  Surgeon: Nadara Mustard, MD;  Location: [facility] OR;  Service: Orthopedics;  Laterality: Right;   TOTAL KNEE ARTHROPLASTY Left 12/07/2017   TOTAL KNEE ARTHROPLASTY Left 12/07/2017   Procedure: LEFT TOTAL KNEE ARTHROPLASTY;  Surgeon: Nadara Mustard, MD;  Location: [facility] OR;  Service: Orthopedics;  Laterality: Left;  TRACHEOSTOMY TUBE PLACEMENT N/A 09/20/2017   Procedure: AWAKE INTUBATION WITH ANESTHESIA WITH VIDEO ASSISTANCE;  Surgeon: Graylin Shiver, MD;  Location: [facility];  Service: ENT;  Laterality: N/A;   TRANSTHORACIC ECHOCARDIOGRAM  08/2014   Normal LV size and function.  Mild LVH.  EF 55-60%.  Normal regional wall motion.  GR 1 DD.  Normal RV size and function .   TRANSTHORACIC ECHOCARDIOGRAM  08/12/2020    [facility]): Normal LV size and function.  EF 55 to 60%.  No RWM A.  Normal filling pattern.  Normal RV.  No significant valvular disease-stenosis or regurgitation.  No vegetation.   TUBAL LIGATION     with reversal in 1994   UPPER GASTROINTESTINAL ENDOSCOPY     VENTRAL HERNIA REPAIR      Family History  Problem Relation Age of Onset   Diabetes Father    Heart attack Father 68   Coronary artery disease Father    Heart failure Father    Throat cancer Father    Hypertension Father    Heart disease Father    Sleep apnea Father    Obesity Father    COPD Mother    Emphysema Mother    Asthma Mother    Heart failure Mother    Breast cancer Mother    Diabetes Mother    Kidney  disease Mother    Thyroid disease Mother    Heart attack Maternal Grandfather    Sarcoidosis Maternal Uncle    Lung cancer Brother    Diabetes Brother    Colon cancer Neg Hx    Colon polyps Neg Hx    Esophageal cancer Neg Hx    Rectal cancer Neg Hx    Stomach cancer Neg Hx     Allergies  Allergen Reactions   Firvanq [Vancomycin] Other (See Comments)    Dose related nephrotoxicity    Trexall [Methotrexate] Other (See Comments)    Peri-oral and buccal lesions   Zestril [Lisinopril] Cough   Chlorhexidine Itching   Cleocin [Clindamycin] Nausea And Vomiting and Rash   Lincocin [Lincomycin Hcl] Nausea And Vomiting and Rash   Teflaro [Ceftaroline] Rash and Other (See Comments)    Tolerates ceftriaxone     Current Medications:   Current Outpatient Medications:    albuterol (PROVENTIL) (2.5 MG/3ML) 0.083% nebulizer solution, Take 3 mLs (2.5 mg total) by nebulization every 6 (six) hours as needed for wheezing or shortness of breath (Use 2-3 times per day when sick.)., Disp: 75 mL, Rfl: 2   albuterol (VENTOLIN HFA) 108 (90 Base) MCG/ACT inhaler, TAKE 2 PUFFS BY MOUTH EVERY 6 HOURS AS NEEDED FOR WHEEZE OR SHORTNESS OF BREATH, Disp: 8.5 each, Rfl: 1   aspirin EC 81 MG tablet, Take 1 tablet (81 mg total) by mouth daily., Disp: 30 tablet, Rfl: 11   atorvastatin (LIPITOR) 40 MG tablet, Take 1 tablet (40 mg total) by mouth daily., Disp: 90 tablet, Rfl: 3   benzonatate (TESSALON PERLES) 100 MG capsule, Take 1-2 capsules (100-200 mg total) by mouth 3 (three) times daily as needed for cough., Disp: 30 capsule, Rfl: 3   BREO ELLIPTA 200-25 MCG/INH AEPB, TAKE 1 PUFF BY MOUTH EVERY DAY (Patient taking differently: Inhale 1 puff into the lungs daily.), Disp: 60 each, Rfl: 5   buPROPion (WELLBUTRIN XL) 300 MG 24 hr tablet, TAKE 1 TABLET BY MOUTH EVERY DAY, Disp: 90 tablet, Rfl: 3   chlorpheniramine-HYDROcodone (TUSSIONEX) 10-8 MG/5ML, Take 5 mLs by mouth at bedtime as  needed for cough (do not drive for 8  hours after taking)., Disp: 115 mL, Rfl: 0   Continuous Glucose Sensor (DEXCOM G7 SENSOR) MISC, 1 DEVICE BY DOES NOT APPLY ROUTE AS DIRECTED. CHANGE SENSOR EVERY 10 DAYS, Disp: 3 each, Rfl: 3   cyclobenzaprine (FLEXERIL) 5 MG tablet, Take 1 tablet (5 mg total) by mouth 3 (three) times daily as needed for muscle spasms (or Headache)., Disp: 30 tablet, Rfl: 0   diltiazem (CARDIZEM CD) 120 MG 24 hr capsule, Take 1 capsule (120 mg total) by mouth daily., Disp: 90 capsule, Rfl: 3   estradiol (ESTRACE) 0.1 MG/GM vaginal cream, Place 0.5 Applicatorfuls vaginally 2 (two) times a week., Disp: , Rfl:    fenofibrate 160 MG tablet, TAKE 1 TABLET BY MOUTH EVERY DAY, Disp: 90 tablet, Rfl: 1   fluticasone (FLONASE) 50 MCG/ACT nasal spray, Place 2 sprays into both nostrils daily as needed for allergies., Disp: 48 mL, Rfl: 3   furosemide (LASIX) 40 MG tablet, Take 1 tablet (40 mg total) by mouth daily., Disp: , Rfl:    gabapentin (NEURONTIN) 100 MG capsule, TAKE 1 CAPSULE BY MOUTH THREE TIMES A DAY, Disp: 270 capsule, Rfl: 3   glucose blood (CONTOUR NEXT TEST) test strip, 1 each by Other route 4 (four) times daily. And lancets 4/day, Disp: 400 each, Rfl: 3   guaiFENesin-dextromethorphan (ROBITUSSIN DM) 100-10 MG/5ML syrup, Take 5 mLs by mouth every 4 (four) hours as needed for cough (chest congestion)., Disp: 118 mL, Rfl: 0   insulin aspart (NOVOLOG FLEXPEN) 100 UNIT/ML FlexPen, Inject 15 Units into the skin daily with supper. (Patient taking differently: Inject into the skin 3 (three) times daily with meals. 8 untis, 12 units and 14 unts), Disp: 15 mL, Rfl: 11   insulin glargine-yfgn (SEMGLEE, YFGN,) 100 UNIT/ML Pen, Inject 16 Units into the skin every morning. (Patient taking differently: Inject 23 Units into the skin every morning.), Disp: 30 mL, Rfl: 2   Insulin Pen Needle (PEN NEEDLES) 32G X 4 MM MISC, 1 each by Does not apply route 4 (four) times daily. E11.9, Disp: 400 each, Rfl: 0   ipratropium (ATROVENT) 0.06  % nasal spray, PLACE 2 SPRAYS INTO BOTH NOSTRILS 4 TIMES DAILY., Disp: 45 mL, Rfl: 3   KLOR-CON M20 20 MEQ tablet, TAKE 1 & 1/2 TABLETS BY MOUTH TWICE A DAY, Disp: 270 tablet, Rfl: 2   Lancet Devices (EASY MINI EJECT LANCING DEVICE) MISC, Use as directed 4 times daily to test blood sugar, Disp: 1 each, Rfl: 3   metFORMIN (GLUCOPHAGE-XR) 500 MG 24 hr tablet, TAKE 4 TABLETS BY MOUTH DAILY WITH BREAKFAST., Disp: 360 tablet, Rfl: 0   Microlet Lancets MISC, 1 each by Does not apply route 2 (two) times daily. E11.9, Disp: 100 each, Rfl: 2   ondansetron (ZOFRAN-ODT) 4 MG disintegrating tablet, Take 1 tablet (4 mg total) by mouth every 8 (eight) hours as needed for nausea or vomiting., Disp: 20 tablet, Rfl: 0   pantoprazole (PROTONIX) 40 MG tablet, TAKE 1 TABLET BY MOUTH TWICE A DAY, Disp: 180 tablet, Rfl: 1   telmisartan (MICARDIS) 40 MG tablet, TAKE 1 TABLET BY MOUTH EVERY DAY, Disp: 90 tablet, Rfl: 3   tirzepatide (MOUNJARO) 5 MG/0.5ML Pen, INJECT 5 MG SUBCUTANEOUSLY WEEKLY, Disp: 2 mL, Rfl: 1   venlafaxine XR (EFFEXOR-XR) 75 MG 24 hr capsule, TAKE 1 CAPSULE BY MOUTH TWICE A DAY, Disp: 180 capsule, Rfl: 3   Review of Systems:   Negative unless otherwise specified per HPI.  Vitals:   Vitals:   01/02/24 1116  BP: 130/66  Pulse: 85  Temp: (!) 97.4 F (36.3 C)  TempSrc: Temporal  SpO2: 97%  Weight: 209 lb 4 oz (94.9 kg)  Height: 5\' 6"  (1.676 m)     Body mass index is 33.77 kg/m.  Physical Exam:   Physical Exam Vitals and nursing note reviewed.  Constitutional:      General: She is not in acute distress.    Appearance: She is well-developed. She is not ill-appearing or toxic-appearing.  HENT:     Head: Normocephalic and atraumatic.     Right Ear: Tympanic membrane, ear canal and external ear normal. Tympanic membrane is not erythematous, retracted or bulging.     Left Ear: Tympanic membrane, ear canal and external ear normal. Tympanic membrane is not erythematous, retracted or bulging.      Nose:     Right Sinus: Frontal sinus tenderness present. No maxillary sinus tenderness.     Left Sinus: Frontal sinus tenderness present. No maxillary sinus tenderness.     Mouth/Throat:     Pharynx: Uvula midline. No posterior oropharyngeal erythema.  Eyes:     General: Lids are normal.     Conjunctiva/sclera: Conjunctivae normal.  Neck:     Trachea: Trachea normal.  Cardiovascular:     Rate and Rhythm: Normal rate and regular rhythm.     Heart sounds: Normal heart sounds, S1 normal and S2 normal.  Pulmonary:     Effort: Pulmonary effort is normal.     Breath sounds: Normal breath sounds. No decreased breath sounds, wheezing, rhonchi or rales.  Lymphadenopathy:     Cervical: No cervical adenopathy.  Skin:    General: Skin is warm and dry.  Neurological:     Mental Status: She is alert.  Psychiatric:        Speech: Speech normal.        Behavior: Behavior normal. Behavior is cooperative.    Results for orders placed or performed in visit on 01/02/24  POCT rapid strep A  Result Value Ref Range   Rapid Strep A Screen Negative Negative  POC COVID-19  Result Value Ref Range   SARS Coronavirus 2 Ag Negative Negative    Assessment and Plan:   1. Sore throat (Primary) - POCT rapid strep A - POC COVID-19 No red flags on exam.   Will initiate doxycycline, refill albuterol inhaler and tessalon perles per orders.  Discussed taking medications as prescribed.  Reviewed return precautions including new or worsening fever, SOB, new or worsening cough or other concerns.  Push fluids and rest.  I recommend that patient follow-up if symptoms worsen or persist despite treatment x 7-10 days, sooner if needed.   I, Isabelle Course, acting as a Neurosurgeon for Jarold Motto, [place]., have documented all relevant documentation on the behalf of Jarold Motto, [place], as directed by  Jarold Motto, PA while in the presence of Jarold Motto, [place].  I, Isabelle Course, have reviewed all  documentation for this visit. The documentation on 01/02/24 for the exam, diagnosis, procedures, and orders are all accurate and complete.  Jarold Motto, PA-C

## 2024-01-11 ENCOUNTER — Other Ambulatory Visit: Payer: Self-pay | Admitting: Family Medicine

## 2024-01-13 ENCOUNTER — Encounter: Payer: Self-pay | Admitting: Family Medicine

## 2024-01-13 ENCOUNTER — Ambulatory Visit (INDEPENDENT_AMBULATORY_CARE_PROVIDER_SITE_OTHER): Admitting: Family Medicine

## 2024-01-13 VITALS — BP 130/70 | HR 76 | Temp 97.3°F | Ht 66.0 in | Wt 204.4 lb

## 2024-01-13 DIAGNOSIS — R350 Frequency of micturition: Secondary | ICD-10-CM

## 2024-01-13 DIAGNOSIS — I10 Essential (primary) hypertension: Secondary | ICD-10-CM

## 2024-01-13 DIAGNOSIS — R1013 Epigastric pain: Secondary | ICD-10-CM

## 2024-01-13 DIAGNOSIS — K219 Gastro-esophageal reflux disease without esophagitis: Secondary | ICD-10-CM

## 2024-01-13 LAB — POC URINALSYSI DIPSTICK (AUTOMATED)
Bilirubin, UA: NEGATIVE
Blood, UA: NEGATIVE
Glucose, UA: NEGATIVE
Ketones, UA: NEGATIVE
Nitrite, UA: NEGATIVE
Protein, UA: NEGATIVE
Spec Grav, UA: 1.01 (ref 1.010–1.025)
Urobilinogen, UA: 0.2 U/dL
pH, UA: 7 (ref 5.0–8.0)

## 2024-01-13 LAB — CBC WITH DIFFERENTIAL/PLATELET
Basophils Absolute: 0.1 10*3/uL (ref 0.0–0.1)
Basophils Relative: 0.6 % (ref 0.0–3.0)
Eosinophils Absolute: 0.4 10*3/uL (ref 0.0–0.7)
Eosinophils Relative: 3.7 % (ref 0.0–5.0)
HCT: 32.8 % — ABNORMAL LOW (ref 36.0–46.0)
Hemoglobin: 11 g/dL — ABNORMAL LOW (ref 12.0–15.0)
Lymphocytes Relative: 26.4 % (ref 12.0–46.0)
Lymphs Abs: 2.6 10*3/uL (ref 0.7–4.0)
MCHC: 33.7 g/dL (ref 30.0–36.0)
MCV: 89.1 fl (ref 78.0–100.0)
Monocytes Absolute: 0.6 10*3/uL (ref 0.1–1.0)
Monocytes Relative: 5.6 % (ref 3.0–12.0)
Neutro Abs: 6.4 10*3/uL (ref 1.4–7.7)
Neutrophils Relative %: 63.7 % (ref 43.0–77.0)
Platelets: 331 10*3/uL (ref 150.0–400.0)
RBC: 3.68 Mil/uL — ABNORMAL LOW (ref 3.87–5.11)
RDW: 13.1 % (ref 11.5–15.5)
WBC: 10 10*3/uL (ref 4.0–10.5)

## 2024-01-13 LAB — LIPASE: Lipase: 16 U/L (ref 11.0–59.0)

## 2024-01-13 LAB — COMPREHENSIVE METABOLIC PANEL WITH GFR
ALT: 15 U/L (ref 0–35)
AST: 19 U/L (ref 0–37)
Albumin: 4.4 g/dL (ref 3.5–5.2)
Alkaline Phosphatase: 58 U/L (ref 39–117)
BUN: 21 mg/dL (ref 6–23)
CO2: 28 meq/L (ref 19–32)
Calcium: 9.5 mg/dL (ref 8.4–10.5)
Chloride: 102 meq/L (ref 96–112)
Creatinine, Ser: 1.22 mg/dL — ABNORMAL HIGH (ref 0.40–1.20)
GFR: 46.92 mL/min — ABNORMAL LOW (ref >60.00)
Glucose, Bld: 158 mg/dL — ABNORMAL HIGH (ref 70–99)
Potassium: 4.9 meq/L (ref 3.5–5.1)
Sodium: 138 meq/L (ref 135–145)
Total Bilirubin: 0.5 mg/dL (ref 0.2–1.2)
Total Protein: 6.7 g/dL (ref 6.0–8.3)

## 2024-01-13 LAB — AMYLASE: Amylase: 20 U/L — ABNORMAL LOW (ref 27–131)

## 2024-01-13 MED ORDER — FAMOTIDINE 20 MG PO TABS: 20.0000 mg | ORAL_TABLET | Freq: Two times a day (BID) | ORAL | 1 refills | Status: DC

## 2024-01-13 NOTE — Progress Notes (Signed)
 Phone [phone] In person visit   Subjective:   [name] is a 64 y.o. year old very pleasant female patient who presents for/with See problem oriented charting Chief Complaint  Patient presents with   Abdominal Pain    Pt c/o abd pain that started last week and gets nauseated with eating.   Urinary Frequency    Pt c/o frequent urination that started 2 days ago.   Past Medical History-  Patient Active Problem List   Diagnosis Date Noted   Severe persistent asthma 01/04/2019    Priority: High   Subcortical microvascular ischemic occlusive disease 07/12/2018    Priority: High   Chronic diastolic CHF (congestive heart failure) (HCC) 08/13/2017    Priority: High   Pulmonary sarcoidosis (HCC)     Priority: High   Chronic bronchitis (HCC) in never smoker 09/18/2013    Priority: High   Chest pain 04/11/2013    Priority: High   Hypertensive heart disease with chronic diastolic congestive heart failure (HCC)     Priority: High   Sarcoidosis of lung (HCC) 04/10/2007    Priority: High   Type II diabetes mellitus with renal manifestations (HCC) 08/21/2006    Priority: High   Iron deficiency anemia 10/01/2020    Priority: Medium    Hypertriglyceridemia 04/02/2020    Priority: Medium    Aortic atherosclerosis (HCC) 02/14/2020    Priority: Medium    Constipation 04/21/2018    Priority: Medium    Epiglottitis     Priority: Medium    Osteoarthritis of right knee 01/26/2017    Priority: Medium    CKD (chronic kidney disease) stage 3, GFR 30-59 ml/min (HCC) 04/27/2015    Priority: Medium    History of osteomyelitis 03/27/2015    Priority: Medium    Fatty liver 09/30/2014    Priority: Medium    Major depression in full remission (HCC)     Priority: Medium    Gout 08/20/2010    Priority: Medium    Deficiency anemia 09/18/2009    Priority: Medium    Sleep apnea 04/21/2009    Priority: Medium    Hyperlipidemia associated with type 2 diabetes mellitus (HCC) 08/21/2006     Priority: Medium    Hypertension 08/21/2006    Priority: Medium    GERD 08/21/2006    Priority: Medium    Nontraumatic complete tear of left rotator cuff     Priority: Low   Diabetic neuropathy (HCC) 02/20/2019    Priority: Low   Rapid palpitations 07/12/2018    Priority: Low   Impingement of left ankle joint 06/26/2018    Priority: Low   Total knee replacement status, left 12/07/2017    Priority: Low   Dysphagia 09/20/2017    Priority: Low   Plantar fasciitis, right 07/13/2017    Priority: Low   S/P total knee arthroplasty, right 06/29/2017    Priority: Low   Osteoarthrosis, localized, primary, knee, right     Priority: Low   Sprain of calcaneofibular ligament of right ankle 06/06/2017    Priority: Low   Onychomycosis 10/27/2015    Priority: Low   MRSA (methicillin resistant staph aureus) culture positive 03/27/2015    Priority: Low   Hot flashes 07/15/2014    Priority: Low   Abnormal SPEP 04/17/2014    Priority: Low   Fracture of left leg 04/17/2014    Priority: Low   Internal hemorrhoids     Priority: Low   Preoperative clearance 03/25/2014    Priority: Low  Solitary pulmonary nodule, on CT 02/2013 - stable over 2 years in 2015 02/20/2013    Priority: Low   Chronic pain of left wrist 04/19/2022    Priority: 1.   Facet arthropathy, cervical 06/29/2021    Priority: 1.   Foraminal stenosis of cervical region 06/29/2021    Priority: 1.   Preop cardiovascular exam 12/07/2021   Postural dizziness with presyncope 09/30/2020    Medications- reviewed and updated Current Outpatient Medications  Medication Sig Dispense Refill   albuterol (PROVENTIL) (2.5 MG/3ML) 0.083% nebulizer solution Take 3 mLs (2.5 mg total) by nebulization every 6 (six) hours as needed for wheezing or shortness of breath (Use 2-3 times per day when sick.). 75 mL 2   albuterol (VENTOLIN HFA) 108 (90 Base) MCG/ACT inhaler Inhale 1-2 puffs into the lungs every 4 (four) hours as needed for  wheezing or shortness of breath. 8.5 each 1   aspirin EC 81 MG tablet Take 1 tablet (81 mg total) by mouth daily. 30 tablet 11   atorvastatin (LIPITOR) 40 MG tablet Take 1 tablet (40 mg total) by mouth daily. 90 tablet 3   BREO ELLIPTA 200-25 MCG/INH AEPB TAKE 1 PUFF BY MOUTH EVERY DAY (Patient taking differently: Inhale 1 puff into the lungs daily.) 60 each 5   buPROPion (WELLBUTRIN XL) 300 MG 24 hr tablet TAKE 1 TABLET BY MOUTH EVERY DAY 90 tablet 3   chlorpheniramine-HYDROcodone (TUSSIONEX) 10-8 MG/5ML Take 5 mLs by mouth at bedtime as needed for cough (do not drive for 8 hours after taking). 115 mL 0   Continuous Glucose Sensor (DEXCOM G7 SENSOR) MISC 1 DEVICE BY DOES NOT APPLY ROUTE AS DIRECTED. CHANGE SENSOR EVERY 10 DAYS 3 each 3   diltiazem (CARDIZEM CD) 120 MG 24 hr capsule Take 1 capsule (120 mg total) by mouth daily. 90 capsule 3   estradiol (ESTRACE) 0.1 MG/GM vaginal cream Place 0.5 Applicatorfuls vaginally 2 (two) times a week.     famotidine (PEPCID) 20 MG tablet Take 1 tablet (20 mg total) by mouth 2 (two) times daily. 60 tablet 1   fenofibrate 160 MG tablet TAKE 1 TABLET BY MOUTH EVERY DAY 90 tablet 1   fluticasone (FLONASE) 50 MCG/ACT nasal spray Place 2 sprays into both nostrils daily as needed for allergies. 48 mL 3   furosemide (LASIX) 40 MG tablet Take 1 tablet (40 mg total) by mouth daily.     gabapentin (NEURONTIN) 100 MG capsule TAKE 1 CAPSULE BY MOUTH THREE TIMES A DAY 270 capsule 3   glucose blood (CONTOUR NEXT TEST) test strip 1 each by Other route 4 (four) times daily. And lancets 4/day 400 each 3   insulin aspart (NOVOLOG FLEXPEN) 100 UNIT/ML FlexPen Inject 15 Units into the skin daily with supper. 15 mL 11   insulin glargine-yfgn (SEMGLEE, YFGN,) 100 UNIT/ML Pen Inject 16 Units into the skin every morning. (Patient taking differently: Inject 23 Units into the skin every morning.) 30 mL 2   Insulin Pen Needle (PEN NEEDLES) 32G X 4 MM MISC 1 each by Does not apply route  4 (four) times daily. E11.9 400 each 0   ipratropium (ATROVENT) 0.06 % nasal spray PLACE 2 SPRAYS INTO BOTH NOSTRILS 4 TIMES DAILY. 45 mL 3   KLOR-CON M20 20 MEQ tablet TAKE 1 & 1/2 TABLETS BY MOUTH TWICE A DAY 270 tablet 2   Lancet Devices (EASY MINI EJECT LANCING DEVICE) MISC Use as directed 4 times daily to test blood sugar 1 each 3  metFORMIN (GLUCOPHAGE-XR) 500 MG 24 hr tablet TAKE 4 TABLETS BY MOUTH DAILY WITH BREAKFAST. 360 tablet 0   Microlet Lancets MISC 1 each by Does not apply route 2 (two) times daily. E11.9 100 each 2   pantoprazole (PROTONIX) 40 MG tablet TAKE 1 TABLET BY MOUTH TWICE A DAY 180 tablet 1   telmisartan (MICARDIS) 40 MG tablet TAKE 1 TABLET BY MOUTH EVERY DAY 90 tablet 3   tirzepatide (MOUNJARO) 5 MG/0.5ML Pen INJECT 5 MG SUBCUTANEOUSLY WEEKLY 2 mL 1   venlafaxine XR (EFFEXOR-XR) 75 MG 24 hr capsule TAKE 1 CAPSULE BY MOUTH TWICE A DAY 180 capsule 3   benzonatate (TESSALON PERLES) 100 MG capsule Take 1-2 capsules (100-200 mg total) by mouth 3 (three) times daily as needed for cough. (Patient not taking: Reported on 01/13/2024) 30 capsule 3   cyclobenzaprine (FLEXERIL) 5 MG tablet Take 1 tablet (5 mg total) by mouth 3 (three) times daily as needed for muscle spasms (or Headache). (Patient not taking: Reported on 01/13/2024) 30 tablet 0   guaiFENesin-dextromethorphan (ROBITUSSIN DM) 100-10 MG/5ML syrup Take 5 mLs by mouth every 4 (four) hours as needed for cough (chest congestion). (Patient not taking: Reported on 01/13/2024) 118 mL 0   ondansetron (ZOFRAN-ODT) 4 MG disintegrating tablet Take 1 tablet (4 mg total) by mouth every 8 (eight) hours as needed for nausea or vomiting. (Patient not taking: Reported on 01/13/2024) 20 tablet 0   No current facility-administered medications for this visit.     Objective:  BP 130/70   Pulse 76   Temp (!) 97.3 F (36.3 C)   Ht 5\' 6"  (1.676 m)   Wt 204 lb 6.4 oz (92.7 kg)   SpO2 96%   BMI 32.99 kg/m  Gen: NAD, resting comfortably CV:  RRR no murmurs rubs or gallops Lungs: CTAB no crackles, wheeze, rhonchi Abdomen: soft/nontender/nondistended/normal bowel sounds. No rebound or guarding.  Ext: no edema Skin: warm, dry Neuro: grossly normal, moves all extremities   Diabetic foot exam was performed with the following findings:   No deformities, ulcerations, or other skin breakdown Intact posterior tibialis and dorsalis pedis pulses Known neuropathy- does not feel monofilament       Results for orders placed or performed in visit on 01/13/24 (from the past 24 hours)  POCT Urinalysis Dipstick (Automated)     Status: Abnormal   Collection Time: 01/13/24  8:13 AM  Result Value Ref Range   Color, UA yellow    Clarity, UA clear    Glucose, UA Negative Negative   Bilirubin, UA neg    Ketones, UA neg    Spec Grav, UA 1.010 1.010 - 1.025   Blood, UA neg    pH, UA 7.0 5.0 - 8.0   Protein, UA Negative Negative   Urobilinogen, UA 0.2 0.2 or 1.0 E.U./dL   Nitrite, UA neg    Leukocytes, UA Trace (A) Negative   *Note: Due to a large number of results and/or encounters for the requested time period, some results have not been displayed. A complete set of results can be found in Results Review.        Assessment and Plan    # Abdominal pain/urinary frequency S: Patient reports abdominal pain located across upper abdomen that started last week.  She is also noted some nausea with eating- has not thrown up.No fever/chills/constipation or diarrhea. Daily bowel movement have been noted still. No straining. Did start with some burning in her chest around the same time. She is  compliant with pantoprazole 40 mg twice a day. History nissen fundoplication but still with symptoms. Last EGD 09/13/22 with Dr. Marina Goodell  Patient with history of right upper quadrant pain in September 2024 and had CT of abdomen pelvis-had hepatic steatosis and hepatomegaly but otherwise reassuring and no cause of pain found.  Her urine culture did eventually  show Citrobacter and Keflex was stopped and changed to Macrobid at that time.  -In February 2025 had some urinary frequency but tested negative for UTI-had been treated with Augmentin for sinusitis -In November 2024 for patient had urinary frequency and urine culture was negative  - Also 2 days ago started with frequent urination.  Dysuria:no.  Nocturia:yes. Urgency: yes. And has developed some LLQ tenderness with this starting.    A/P: Patient with history of GERD and for the last week has noted upper abdominal discomfort as well as burning sensation in upper chest that started around the same time-concern for flareup and GERD-already on max PPI-pantoprazole twice daily.  Did better on Dexilant but coverage issues so we will lay her on famotidine 20 mg twice daily for 1 to 2 months-if not improving within the next week or 2 consider imaging or referral to GI-certainly sooner if worsens - Also update blood work including pancreatic testing (on [place] which can cause pancreatitis and can worsen GERD) and checking LFTs-consider imaging earlier if concerning findings  For urinary frequency-urine cultures have been negative in the past with similar symptoms but has some left lower quadrant pain as well-we will check urine culture but we opted to hold off on empiric treatment With only trace leukocytes on urine  # hypertension/hypertensive heart disease with chronic diastolic CHF S: compliant with Lasix 40 mg daily (also potassium 20 meq), diltiazem extended release 120 mg,  telmisartan 40 mg  BP Readings from Last 3 Encounters:  01/13/24 130/70  01/02/24 130/66  12/05/23 128/70  A/P: blood pressure well controlled continue current medications    #Type 2 diabetes mellitus/morbid obesity-sees Dr. Roosevelt Locks- has done well on Mounjaro- but could be affecting abdominal symptoms. Congratulated on weight loss Lab Results  Component Value Date   HGBA1C 6.6 (A) 10/20/2023   HGBA1C 7.0 (A) 07/27/2023    HGBA1C 6.9 (A) 03/21/2023  - diabetes well controlled with care of Dr. Roosevelt Locks I discussed the microalbumin to creatinine lab error with patient that occurred with [facility] labs.  Essentially the ratio was off by a factor of 10.  In this patient's individual case her corrected value from 10 months ago would be 21 and then had [facility] do testing 2 months ago and still normal at 15- no change in therapy is needed for her.    Recommended follow up: Return for next already scheduled visit or sooner if needed. Future Appointments  Date Time Provider Department Center  01/18/2024  2:00 PM Altamese Ripley, MD [facility] None  02/08/2024  9:00 AM [facility]  02/23/2024  9:30 AM Parrett, Virgel Bouquet, NP [facility] None  03/12/2024 11:00 AM Shelva Majestic, MD [facility]  03/20/2024 11:45 AM [facility] ANNUAL WELLNESS VISIT 1 [facility]   Lab/Order associations:   ICD-10-CM   1. Epigastric abdominal pain  R10.13 Comprehensive metabolic panel with GFR    CBC with Differential/Platelet    Lipase    Amylase    2. Urinary frequency  R35.0 POCT Urinalysis Dipstick (Automated)    Urine Culture    Urine Culture    3. Primary hypertension  I10  4. Gastroesophageal reflux disease without esophagitis  K21.9       Meds ordered this encounter  Medications   famotidine (PEPCID) 20 MG tablet    Sig: Take 1 tablet (20 mg total) by mouth 2 (two) times daily.    Dispense:  60 tablet    Refill:  1    Return precautions advised.  Tana Conch, MD

## 2024-01-13 NOTE — Patient Instructions (Addendum)
 Please stop by lab before you go If you have mychart- we will send your results within 3 business days of [place] receiving them.  If you do not have mychart- we will call you about results within 5 business days of [place] receiving them.  *please also note that you will see labs on mychart as soon as they post. I will later go in and write notes on them- will say "notes from Dr. Durene Cal"   We are going to wait on culture- but you may need antibiotic for potential urinary tract infection  Start Pepcid twice daily before meals in addition to pantoprazole twice daily- for at least a month- if completely resolves can go back down -if not improving even if labs are ok within a week or two let me know- we could consider imaging or gastroenterology but we wanted to try to reduce radiation if possible  Recommended follow up: Return for next already scheduled visit or sooner if needed.

## 2024-01-15 ENCOUNTER — Other Ambulatory Visit: Payer: Self-pay

## 2024-01-15 ENCOUNTER — Emergency Department (HOSPITAL_BASED_OUTPATIENT_CLINIC_OR_DEPARTMENT_OTHER)
Admission: EM | Admit: 2024-01-15 | Discharge: 2024-01-15 | Disposition: A | Discharge status: Home / Self Care | Attending: Emergency Medicine | Admitting: Emergency Medicine

## 2024-01-15 ENCOUNTER — Encounter (HOSPITAL_BASED_OUTPATIENT_CLINIC_OR_DEPARTMENT_OTHER): Payer: Self-pay | Admitting: Emergency Medicine

## 2024-01-15 ENCOUNTER — Emergency Department (HOSPITAL_BASED_OUTPATIENT_CLINIC_OR_DEPARTMENT_OTHER): Admitting: Radiology

## 2024-01-15 DIAGNOSIS — E119 Type 2 diabetes mellitus without complications: Secondary | ICD-10-CM | POA: Insufficient documentation

## 2024-01-15 DIAGNOSIS — S4992XA Unspecified injury of left shoulder and upper arm, initial encounter: Secondary | ICD-10-CM | POA: Diagnosis present

## 2024-01-15 DIAGNOSIS — S41132A Puncture wound without foreign body of left upper arm, initial encounter: Secondary | ICD-10-CM | POA: Insufficient documentation

## 2024-01-15 DIAGNOSIS — W460XXA Contact with hypodermic needle, initial encounter: Secondary | ICD-10-CM | POA: Insufficient documentation

## 2024-01-15 DIAGNOSIS — I509 Heart failure, unspecified: Secondary | ICD-10-CM | POA: Insufficient documentation

## 2024-01-15 DIAGNOSIS — I110 Hypertensive heart disease with heart failure: Secondary | ICD-10-CM | POA: Diagnosis not present

## 2024-01-15 DIAGNOSIS — Z794 Long term (current) use of insulin: Secondary | ICD-10-CM | POA: Insufficient documentation

## 2024-01-15 DIAGNOSIS — Z7982 Long term (current) use of aspirin: Secondary | ICD-10-CM | POA: Insufficient documentation

## 2024-01-15 DIAGNOSIS — Z7984 Long term (current) use of oral hypoglycemic drugs: Secondary | ICD-10-CM | POA: Diagnosis not present

## 2024-01-15 LAB — URINE CULTURE
MICRO NUMBER:: 16290656
SPECIMEN QUALITY:: ADEQUATE

## 2024-01-15 NOTE — ED Provider Notes (Addendum)
 [place] EMERGENCY DEPARTMENT AT [facility] Provider Note   CSN: [id number] Arrival date & time: 01/15/24  1451     History  Chief Complaint  Patient presents with   Foreign Body    [name] is a 63 y.o. female.  Patient removed her Dexcom sensor yesterday because it was not functioning.  And they noticed that the needle that is normally at the base of it was not there and they think it is in her left arm area.  They feel something a little hard there.  Did replace the Dexcom sensor over to her right arm.  Should be a metallic foreign body.  Past medical history significant for diabetes hyperlipidemia obesity hypertension pulmonary sarcoidosis.  Osteomyelitis of left foot in 2016 congestive heart failure.  Past surgical history significant for abdominal hysterectomy and gallbladder removal.  Appendectomy as well.  Patient's had heart catheterizations in the past open reduction internal fixation distal femur fracture 2021 at [facility].  Patient is never used tobacco products.       Home Medications Prior to Admission medications   Medication Sig Start Date End Date Taking? Authorizing Provider  albuterol (PROVENTIL) (2.5 MG/3ML) 0.083% nebulizer solution Take 3 mLs (2.5 mg total) by nebulization every 6 (six) hours as needed for wheezing or shortness of breath (Use 2-3 times per day when sick.). 10/11/23   Dulce Sellar, NP  albuterol (VENTOLIN HFA) 108 (90 Base) MCG/ACT inhaler Inhale 1-2 puffs into the lungs every 4 (four) hours as needed for wheezing or shortness of breath. 01/02/24   Jarold Motto, PA  aspirin EC 81 MG tablet Take 1 tablet (81 mg total) by mouth daily. 11/03/20   Marykay Lex, MD  atorvastatin (LIPITOR) 40 MG tablet Take 1 tablet (40 mg total) by mouth daily. 09/19/23   Shelva Majestic, MD  benzonatate (TESSALON PERLES) 100 MG capsule Take 1-2 capsules (100-200 mg total) by mouth 3 (three) times daily as needed for cough. Patient  not taking: Reported on 01/13/2024 01/02/24   Jarold Motto, PA  BREO ELLIPTA 200-25 MCG/INH AEPB TAKE 1 PUFF BY MOUTH EVERY DAY Patient taking differently: Inhale 1 puff into the lungs daily. 06/04/19   Shelva Majestic, MD  buPROPion (WELLBUTRIN XL) 300 MG 24 hr tablet TAKE 1 TABLET BY MOUTH EVERY DAY 01/11/24   Shelva Majestic, MD  chlorpheniramine-HYDROcodone (TUSSIONEX) 10-8 MG/5ML Take 5 mLs by mouth at bedtime as needed for cough (do not drive for 8 hours after taking). 08/03/23   Ardith Dark, MD  Continuous Glucose Sensor (DEXCOM G7 SENSOR) MISC 1 DEVICE BY DOES NOT APPLY ROUTE AS DIRECTED. CHANGE SENSOR EVERY 10 DAYS 12/07/23   Altamese [place], MD  cyclobenzaprine (FLEXERIL) 5 MG tablet Take 1 tablet (5 mg total) by mouth 3 (three) times daily as needed for muscle spasms (or Headache). Patient not taking: Reported on 01/13/2024 11/04/22   Dulce Sellar, NP  diltiazem (CARDIZEM CD) 120 MG 24 hr capsule Take 1 capsule (120 mg total) by mouth daily. 02/17/23   Corrin Parker, PA-C  estradiol (ESTRACE) 0.1 MG/GM vaginal cream Place 0.5 Applicatorfuls vaginally 2 (two) times a week.    [provider]  famotidine (PEPCID) 20 MG tablet Take 1 tablet (20 mg total) by mouth 2 (two) times daily. 01/13/24   Shelva Majestic, MD  fenofibrate 160 MG tablet TAKE 1 TABLET BY MOUTH EVERY DAY 10/10/23   Marykay Lex, MD  fluticasone [facility]) 50 MCG/ACT nasal spray  Place 2 sprays into both nostrils daily as needed for allergies. 08/11/23   Gillis Santa, MD  furosemide (LASIX) 40 MG tablet Take 1 tablet (40 mg total) by mouth daily. 06/12/22   Lonia Blood, MD  gabapentin (NEURONTIN) 100 MG capsule TAKE 1 CAPSULE BY MOUTH THREE TIMES A DAY 09/06/23   Shelva Majestic, MD  glucose blood (CONTOUR NEXT TEST) test strip 1 each by Other route 4 (four) times daily. And lancets 4/day 09/19/19   Romero Belling, MD  guaiFENesin-dextromethorphan [facility] DM) 100-10 MG/5ML syrup Take 5 mLs by  mouth every 4 (four) hours as needed for cough (chest congestion). Patient not taking: Reported on 01/13/2024 08/11/23   Gillis Santa, MD  insulin aspart (NOVOLOG FLEXPEN) 100 UNIT/ML FlexPen Inject 15 Units into the skin daily with supper. 05/13/23   Motwani, Carin Hock, MD  insulin glargine-yfgn (SEMGLEE, YFGN,) 100 UNIT/ML Pen Inject 16 Units into the skin every morning. Patient taking differently: Inject 23 Units into the skin every morning. 10/18/23   Motwani, Carin Hock, MD  Insulin Pen Needle (PEN NEEDLES) 32G X 4 MM MISC 1 each by Does not apply route 4 (four) times daily. E11.9 02/22/20   Romero Belling, MD  ipratropium (ATROVENT) 0.06 % nasal spray PLACE 2 SPRAYS INTO BOTH NOSTRILS 4 TIMES DAILY. 05/11/23   Shelva Majestic, MD  KLOR-CON M20 20 MEQ tablet TAKE 1 & 1/2 TABLETS BY MOUTH TWICE A DAY 10/14/23   Shelva Majestic, MD  Lancet Devices (EASY MINI EJECT LANCING DEVICE) MISC Use as directed 4 times daily to test blood sugar 11/04/20   Romero Belling, MD  metFORMIN (GLUCOPHAGE-XR) 500 MG 24 hr tablet TAKE 4 TABLETS BY MOUTH DAILY WITH BREAKFAST. 08/29/23   Altamese [place], MD  Microlet Lancets MISC 1 each by Does not apply route 2 (two) times daily. E11.9 10/03/23   Motwani, Carin Hock, MD  ondansetron (ZOFRAN-ODT) 4 MG disintegrating tablet Take 1 tablet (4 mg total) by mouth every 8 (eight) hours as needed for nausea or vomiting. Patient not taking: Reported on 01/13/2024 08/18/23   Shelva Majestic, MD  pantoprazole (PROTONIX) 40 MG tablet TAKE 1 TABLET BY MOUTH TWICE A DAY 08/29/23   Hilarie Fredrickson, MD  telmisartan (MICARDIS) 40 MG tablet TAKE 1 TABLET BY MOUTH EVERY DAY 12/19/23   Shelva Majestic, MD  tirzepatide [facility]) 5 MG/0.5ML Pen INJECT 5 MG SUBCUTANEOUSLY WEEKLY 12/26/23   Motwani, Carin Hock, MD  venlafaxine XR (EFFEXOR-XR) 75 MG 24 hr capsule TAKE 1 CAPSULE BY MOUTH TWICE A DAY 01/31/23   Shelva Majestic, MD  mupirocin nasal ointment (BACTROBAN) 2 % Place 1 application into the nose 2 (two) times  daily. Use one-half of tube in each nostril twice daily for five (5) days. After application, press sides of nose together and gently massage. 03/28/18 03/28/18  Shelva Majestic, MD      Allergies    Firvanq [vancomycin], Trexall [methotrexate], Zestril [lisinopril], Chlorhexidine, Cleocin [clindamycin], Lincocin [lincomycin hcl], and Teflaro [ceftaroline]    Review of Systems   Review of Systems  Constitutional:  Negative for chills and fever.  HENT:  Negative for ear pain and sore throat.   Eyes:  Negative for pain and visual disturbance.  Respiratory:  Negative for cough and shortness of breath.   Cardiovascular:  Negative for chest pain and palpitations.  Gastrointestinal:  Negative for abdominal pain and vomiting.  Genitourinary:  Negative for dysuria and hematuria.  Musculoskeletal:  Negative for arthralgias and back pain.  Skin:  Positive for wound. Negative for color change and rash.  Neurological:  Negative for seizures and syncope.  All other systems reviewed and are negative.   Physical Exam Updated Vital Signs BP (!) 147/67 (BP Location: Right Arm)   Pulse 79   Temp 97.6 F (36.4 C) (Oral)   Resp 18   Ht 1.676 m (5\' 6" )   Wt 92.7 kg   SpO2 99%   BMI 32.99 kg/m  Physical Exam Vitals and nursing note reviewed.  Constitutional:      General: She is not in acute distress.    Appearance: Normal appearance. She is well-developed.  HENT:     Head: Normocephalic and atraumatic.  Eyes:     Extraocular Movements: Extraocular movements intact.     Conjunctiva/sclera: Conjunctivae normal.     Pupils: Pupils are equal, round, and reactive to light.  Cardiovascular:     Rate and Rhythm: Normal rate and regular rhythm.     Heart sounds: No murmur heard. Pulmonary:     Effort: Pulmonary effort is normal. No respiratory distress.     Breath sounds: Normal breath sounds.  Abdominal:     Palpations: Abdomen is soft.     Tenderness: There is no abdominal tenderness.   Musculoskeletal:        General: Signs of injury present. No swelling.     Cervical back: Neck supple.     Comments: Left arm with an area of some redness with a little bit of hemorrhage under the skin.  Not able to palpate the foreign body certainly not sticking up.  Left arm distally radial pulse 2+.  Neurovascularly intact.  Skin:    General: Skin is warm and dry.     Capillary Refill: Capillary refill takes less than 2 seconds.  Neurological:     General: No focal deficit present.     Mental Status: She is alert and oriented to person, place, and time.  Psychiatric:        Mood and Affect: Mood normal.     ED Results / Procedures / Treatments   Labs (all labs ordered are listed, but only abnormal results are displayed) Labs Reviewed - No data to display  EKG None  Radiology DG Humerus Left Result Date: 01/15/2024 CLINICAL DATA:  Concern for retained needle in the left arm. EXAM: LEFT HUMERUS - 2+ VIEW COMPARISON:  None Available. FINDINGS: No radiopaque foreign body is identified. Remote healed fracture deformity of the left proximal humerus head and neck junction. There is no evidence of acute fracture IMPRESSION: No radiopaque foreign body identified. Electronically Signed   By: Hart Robinsons M.D.   On: 01/15/2024 16:01    Procedures Procedures    Medications Ordered in ED Medications - No data to display  ED Course/ Medical Decision Making/ A&P                                 Medical Decision Making Amount and/or Complexity of Data Reviewed Radiology: ordered.   Will get x-rays to confirm foreign body size and placement.  But since is not palpable may be challenging to get out.  With it being a hard metallic items possible the body may push that out on its own.  X-ray negative for any metallic foreign body in the left arm.   Final Clinical Impression(s) / ED Diagnoses Final diagnoses:  Puncture wound of left upper arm without foreign  body, initial  encounter    Rx / DC Orders ED Discharge Orders     None         Vanetta Mulders, MD 01/15/24 0865    Vanetta Mulders, MD 01/15/24 1623

## 2024-01-15 NOTE — Discharge Instructions (Signed)
 No evidence of any foreign body left behind in the left arm.

## 2024-01-15 NOTE — ED Triage Notes (Signed)
 Pt via pov from home with dexcom needle in her arm since yesterday. Husband changed the dexcom and no needle was on the apparatus when it came out. Small hard place on left arm. Pt alert & oriented, nad noted.

## 2024-01-15 NOTE — ED Notes (Signed)
 Discharge paperwork given and verbally understood.

## 2024-01-16 ENCOUNTER — Telehealth: Payer: Self-pay | Admitting: Family Medicine

## 2024-01-16 ENCOUNTER — Encounter: Payer: Self-pay | Admitting: Family Medicine

## 2024-01-16 ENCOUNTER — Telehealth: Payer: Self-pay | Admitting: *Deleted

## 2024-01-16 ENCOUNTER — Other Ambulatory Visit: Payer: Self-pay | Admitting: Family Medicine

## 2024-01-16 MED ORDER — FOSFOMYCIN TROMETHAMINE 3 G PO PACK: 3.0000 g | PACK | Freq: Once | ORAL | 0 refills | Status: AC

## 2024-01-16 NOTE — Telephone Encounter (Signed)
 Dexcom needle broke off under skin. Seen in [facility] on 01/15/24  Patient Name First: [name] Last: [name] Gender: Female DOB: 1961-09-06 Age: 63 Y 5 M 16 D Return Phone Number: [phone] (Primary), [phone] (Secondary) Address: City/ State/ Zip: [place]  [address] Client [facility] Night - Human resources [facility] Night Provider Tana Conch- MD Contact Type Call Who Is Calling Patient / Member / Family / Caregiver Call Type Triage / Clinical Caller Name Najat Olazabal Caller Phone Number [phone] Relationship To Patient Spouse Return Phone Number [phone] (Primary) Chief Complaint Arm Injury Reason for Call Symptomatic / Request for Health Information Initial Comment Caller states he needs advice on if he needs to make an Appt for the Pt or take her to the ED. Caller states the needle on the dexcom meter has broken off under the skin and it's painful in the arm area. Per nurse make urgent. Translation No Nurse Assessment Nurse: Nicholaus Bloom, RN, Judeth Cornfield Date/Time Lamount Cohen Time): 01/15/2024 12:59:09 PM Confirm and document reason for call. If symptomatic, describe symptoms. ---caller reports that pt wears a dexcom 7 and Friday night it wasn't working so yesterday they took it out and noticed that the needle was not attached having pain at insertion site and cannot get the needle out Does the patient have any new or worsening symptoms? ---Yes Will a triage be completed? ---Yes Related visit to physician within the last 2 weeks? ---No Does the PT have any chronic conditions? (i.e. diabetes, asthma, this includes High risk factors for pregnancy, etc.) ---Yes List chronic conditions. ---diabetic, high cholesterol, depression Is this a behavioral health or substance abuse call? ---No Guidelines Guideline Title Affirmed Question Affirmed Notes Nurse Date/Time Lamount Cohen Time) Skin Foreign Body  Deeply embedded FB (e.g., needle or toothpick in foot) Nicholaus Bloom, RN, Judeth Cornfield 01/15/2024 1:01:07 PM Disp. Time Lamount Cohen Time) Disposition Final User 01/15/2024 12:57:56 PM Send to Urgent Stevphen Meuse 01/15/2024 1:04:38 PM Go to ED Now Yes Nicholaus Bloom, RN, Judeth Cornfield Final Disposition 01/15/2024 1:04:38 PM Go to ED Now Yes Nicholaus Bloom, RN, Loura Halt Disagree/Comply Comply Caller Understands Yes PreDisposition Call Doctor Care Advice Given Per Guideline GO TO ED NOW: * You need to be seen in the Emergency Department. * Go to the ED at ___________ Hospital. * Leave now. Drive carefully. ALTERNATE DISPOSITION - URGENT CARE CENTER: * An Urgent Care Center can usually manage this problem, IF one is available in the caller's area. CARE ADVICE given per Skin Foreign Body (Adult) guideline. Referrals GO TO FACILITY UNDECIDED

## 2024-01-16 NOTE — Telephone Encounter (Signed)
 Copied from CRM [id number]. Topic: Clinical - Lab/Test Results >> Jan 16, 2024  3:09 PM [name] wrote: Reason for CRM: calling about urine culture results, please call [phone]  Patient informed that Dr. Durene Cal has not looked at results and made recommendations, someone will call her when he does. Patient verbalized understanding.

## 2024-01-18 ENCOUNTER — Ambulatory Visit (INDEPENDENT_AMBULATORY_CARE_PROVIDER_SITE_OTHER): Admitting: "Endocrinology

## 2024-01-18 ENCOUNTER — Encounter: Payer: Self-pay | Admitting: "Endocrinology

## 2024-01-18 VITALS — BP 122/70 | HR 87 | Ht 66.0 in | Wt 209.0 lb

## 2024-01-18 DIAGNOSIS — Z7984 Long term (current) use of oral hypoglycemic drugs: Secondary | ICD-10-CM | POA: Diagnosis not present

## 2024-01-18 DIAGNOSIS — E7800 Pure hypercholesterolemia, unspecified: Secondary | ICD-10-CM

## 2024-01-18 DIAGNOSIS — Z794 Long term (current) use of insulin: Secondary | ICD-10-CM | POA: Diagnosis not present

## 2024-01-18 DIAGNOSIS — Z7985 Long-term (current) use of injectable non-insulin antidiabetic drugs: Secondary | ICD-10-CM

## 2024-01-18 DIAGNOSIS — E1165 Type 2 diabetes mellitus with hyperglycemia: Secondary | ICD-10-CM | POA: Diagnosis not present

## 2024-01-18 LAB — POCT GLYCOSYLATED HEMOGLOBIN (HGB A1C): Hemoglobin A1C: 6.9 % — AB (ref 4.0–5.6)

## 2024-01-18 MED ORDER — TIRZEPATIDE 7.5 MG/0.5ML ~~LOC~~ SOAJ: 7.5000 mg | SUBCUTANEOUS | 0 refills | Status: DC

## 2024-01-18 NOTE — Progress Notes (Signed)
 Outpatient Endocrinology Note Altamese Montcalm, MD  01/18/24   [name] 01/04/1961 [id number]  Referring Provider: Shelva Majestic, MD Primary Care Provider: Shelva Majestic, MD Reason for consultation: Subjective   Assessment & Plan  Diagnoses and all orders for this visit:  Uncontrolled type 2 diabetes mellitus with hyperglycemia, with long-term current use of insulin (HCC) -     POCT glycosylated hemoglobin (Hb A1C) -     Basic metabolic panel with GFR  Long term (current) use of oral hypoglycemic drugs  Long-term (current) use of injectable non-insulin antidiabetic drugs  Long-term insulin use (HCC)  Pure hypercholesterolemia  Other orders -     tirzepatide (MOUNJARO) 7.5 MG/0.5ML Pen; Inject 7.5 mg into the skin once a week.   Diabetes complicated by nephropathy Hba1c goal less than 7.0, current Hba1c is 6.9. Will recommend the following: Metformin 2000 mg a day, mounjaro 7.5 mg/week Lantus 22 units at am BMP Q3 mo to check GFR (pt knows to cut metformin to 1000 mg every day if GFR drops <45)  12/2023 Stopped premeal NovoLog 07-22-13 fifteen min before meals due to HYPOGLYCEMIA, as low as 40s Stopped Trulicity 4.5 mg weekly due to poor weight loss Got sick with ozempic  No known contraindications to any of above medications  Hyperlipidemia -Last LDL at goal: 56, Tg 154 -on atorvastatin 40 mg QD and fenofibrate 160 mg qd -Follow low fat diet and exercise   -Blood pressure goal <140/90 - Microalbumin/creatinine at goal < 30 - on ACE/ARB Telmisartan 40 mg qd -diet changes including salt restriction -limit eating outside -counseled BP targets per standards of diabetes care -Uncontrolled blood pressure can lead to retinopathy, nephropathy and cardiovascular and atherosclerotic heart disease  Reviewed and counseled on: -A1C target -Blood sugar targets -Complications of uncontrolled diabetes  -Checking blood sugar before meals and bedtime  and bring log next visit -All medications with mechanism of action and side effects -Hypoglycemia management: rule of 15's, Glucagon Emergency Kit and medical alert ID -low-carb low-fat plate-method diet -At least 20 minutes of physical activity per day -Annual dilated retinal eye exam and foot exam -compliance and follow up needs -follow up as scheduled or earlier if problem gets worse  Call if blood sugar is less than 70 or consistently above 250    Take a 15 gm snack of carbohydrate at bedtime before you go to sleep if your blood sugar is less than 100.    If you are going to fast after midnight for a test or procedure, ask your physician for instructions on how to reduce/decrease your insulin dose.    Call if blood sugar is less than 70 or consistently above 250  -Treating a low sugar by rule of 15  (15 gms of sugar every 15 min until sugar is more than 70) If you feel your sugar is low, test your sugar to be sure If your sugar is low (less than 70), then take 15 grams of a fast acting Carbohydrate (3-4 glucose tablets or glucose gel or 4 ounces of juice or regular soda) Recheck your sugar 15 min after treating low to make sure it is more than 70 If sugar is still less than 70, treat again with 15 grams of carbohydrate          Don't drive the hour of hypoglycemia  If unconscious/unable to eat or drink by mouth, use glucagon injection or nasal spray baqsimi and call 911. Can repeat again in 15  min if still unconscious.  Return in about 3 months (around 04/18/2024) for visit + labs before next visit.   I have reviewed current medications, nurse's notes, allergies, vital signs, past medical and surgical history, family medical history, and social history for this encounter. Counseled patient on symptoms, examination findings, lab findings, imaging results, treatment decisions and monitoring and prognosis. The patient understood the recommendations and agrees with the treatment plan. All  questions regarding treatment plan were fully answered.  Altamese Yetter, MD  01/18/24   History of Present Illness [name] is a 63 y.o. year old female who presents for evaluation of Type 2 diabetes mellitus.  DILLYN JOAQUIN was first diagnosed in 2007.    Home diabetes regimen: Metformin 2000 mg a day, Trulicity 4.5 mg weekly Lantus 22 units at am,  Stopped Premeal NovoLog 07-22-13 units tidac   Previous history:  Non-insulin hypoglycemic drugs previously used: Ozempic, metformin Insulin was started a few years after diagnosis Side effects from medications: Ozempic: nausea  COMPLICATIONS -  MI/Stroke -  retinopathy +  neuropathy-sees podiatrist annually Dr Logan Bores  +  nephropathy  BLOOD SUGAR DATA  CGM interpretation: At today's visit, we reviewed her CGM downloads. The full report is scanned in the media. Reviewing the CGM trends, blood glucose is mildly elevated after break fast.  Physical Exam  BP 122/70   Pulse 87   Ht 5\' 6"  (1.676 m)   Wt 209 lb (94.8 kg)   SpO2 97%   BMI 33.73 kg/m    Constitutional: well developed, well nourished Head: normocephalic, atraumatic Eyes: sclera anicteric, no redness Neck: supple Lungs: normal respiratory effort Neurology: alert and oriented Skin: dry, no appreciable rashes Musculoskeletal: no appreciable defects Psychiatric: normal mood and affect   Current Medications Patient's Medications  New Prescriptions   TIRZEPATIDE (MOUNJARO) 7.5 MG/0.5ML PEN    Inject 7.5 mg into the skin once a week.  Previous Medications   ALBUTEROL (PROVENTIL) (2.5 MG/3ML) 0.083% NEBULIZER SOLUTION    Take 3 mLs (2.5 mg total) by nebulization every 6 (six) hours as needed for wheezing or shortness of breath (Use 2-3 times per day when sick.).   ALBUTEROL (VENTOLIN HFA) 108 (90 BASE) MCG/ACT INHALER    Inhale 1-2 puffs into the lungs every 4 (four) hours as needed for wheezing or shortness of breath.   ASPIRIN EC 81 MG TABLET    Take 1  tablet (81 mg total) by mouth daily.   ATORVASTATIN (LIPITOR) 40 MG TABLET    Take 1 tablet (40 mg total) by mouth daily.   BENZONATATE (TESSALON PERLES) 100 MG CAPSULE    Take 1-2 capsules (100-200 mg total) by mouth 3 (three) times daily as needed for cough.   BREO ELLIPTA 200-25 MCG/INH AEPB    TAKE 1 PUFF BY MOUTH EVERY DAY   BUPROPION (WELLBUTRIN XL) 300 MG 24 HR TABLET    TAKE 1 TABLET BY MOUTH EVERY DAY   CHLORPHENIRAMINE-HYDROCODONE (TUSSIONEX) 10-8 MG/5ML    Take 5 mLs by mouth at bedtime as needed for cough (do not drive for 8 hours after taking).   CONTINUOUS GLUCOSE SENSOR (DEXCOM G7 SENSOR) MISC    1 DEVICE BY DOES NOT APPLY ROUTE AS DIRECTED. CHANGE SENSOR EVERY 10 DAYS   CYCLOBENZAPRINE (FLEXERIL) 5 MG TABLET    Take 1 tablet (5 mg total) by mouth 3 (three) times daily as needed for muscle spasms (or Headache).   DILTIAZEM (CARDIZEM CD) 120 MG 24 HR CAPSULE  Take 1 capsule (120 mg total) by mouth daily.   ESTRADIOL (ESTRACE) 0.1 MG/GM VAGINAL CREAM    Place 0.5 Applicatorfuls vaginally 2 (two) times a week.   FAMOTIDINE (PEPCID) 20 MG TABLET    Take 1 tablet (20 mg total) by mouth 2 (two) times daily.   FENOFIBRATE 160 MG TABLET    TAKE 1 TABLET BY MOUTH EVERY DAY   FLUTICASONE (FLONASE) 50 MCG/ACT NASAL SPRAY    Place 2 sprays into both nostrils daily as needed for allergies.   FUROSEMIDE (LASIX) 40 MG TABLET    Take 1 tablet (40 mg total) by mouth daily.   GABAPENTIN (NEURONTIN) 100 MG CAPSULE    TAKE 1 CAPSULE BY MOUTH THREE TIMES A DAY   GLUCOSE BLOOD (CONTOUR NEXT TEST) TEST STRIP    1 each by Other route 4 (four) times daily. And lancets 4/day   GUAIFENESIN-DEXTROMETHORPHAN (ROBITUSSIN DM) 100-10 MG/5ML SYRUP    Take 5 mLs by mouth every 4 (four) hours as needed for cough (chest congestion).   INSULIN ASPART (NOVOLOG FLEXPEN) 100 UNIT/ML FLEXPEN    Inject 15 Units into the skin daily with supper.   INSULIN GLARGINE-YFGN (SEMGLEE, YFGN,) 100 UNIT/ML PEN    Inject 16 Units into  the skin every morning.   INSULIN PEN NEEDLE (PEN NEEDLES) 32G X 4 MM MISC    1 each by Does not apply route 4 (four) times daily. E11.9   IPRATROPIUM (ATROVENT) 0.06 % NASAL SPRAY    PLACE 2 SPRAYS INTO BOTH NOSTRILS 4 TIMES DAILY.   KLOR-CON M20 20 MEQ TABLET    TAKE 1 & 1/2 TABLETS BY MOUTH TWICE A DAY   LANCET DEVICES (EASY MINI EJECT LANCING DEVICE) MISC    Use as directed 4 times daily to test blood sugar   METFORMIN (GLUCOPHAGE-XR) 500 MG 24 HR TABLET    TAKE 4 TABLETS BY MOUTH DAILY WITH BREAKFAST.   MICROLET LANCETS MISC    1 each by Does not apply route 2 (two) times daily. E11.9   ONDANSETRON (ZOFRAN-ODT) 4 MG DISINTEGRATING TABLET    Take 1 tablet (4 mg total) by mouth every 8 (eight) hours as needed for nausea or vomiting.   PANTOPRAZOLE (PROTONIX) 40 MG TABLET    TAKE 1 TABLET BY MOUTH TWICE A DAY   TELMISARTAN (MICARDIS) 40 MG TABLET    TAKE 1 TABLET BY MOUTH EVERY DAY   VENLAFAXINE XR (EFFEXOR-XR) 75 MG 24 HR CAPSULE    TAKE 1 CAPSULE BY MOUTH TWICE A DAY  Modified Medications   No medications on file  Discontinued Medications   TIRZEPATIDE (MOUNJARO) 5 MG/0.5ML PEN    INJECT 5 MG SUBCUTANEOUSLY WEEKLY    Allergies Allergies  Allergen Reactions   Firvanq [Vancomycin] Other (See Comments)    Dose related nephrotoxicity    Trexall [Methotrexate] Other (See Comments)    Peri-oral and buccal lesions   Zestril [Lisinopril] Cough   Chlorhexidine Itching   Cleocin [Clindamycin] Nausea And Vomiting and Rash   Lincocin [Lincomycin Hcl] Nausea And Vomiting and Rash   Teflaro [Ceftaroline] Rash and Other (See Comments)    Tolerates ceftriaxone     Past Medical History Past Medical History:  Diagnosis Date   Abnormal SPEP 04/17/2014   Acute left ankle pain 01/26/2017   ANEMIA-UNSPECIFIED 09/18/2009   Anxiety    Arthritis    Blood transfusion without reported diagnosis    Cataract    CHF (congestive heart failure) (HCC)    Chronic diastolic  heart failure, NYHA class 2 (HCC)     Normal LVEDP by May 2018   COPD (chronic obstructive pulmonary disease) (HCC)    Depression    DIABETES MELLITUS, TYPE II 08/21/2006   Diabetic osteomyelitis (HCC) 05/29/2015   Fatty liver    Fracture of 5th metatarsal    non union   GERD 08/21/2006   Had fundoplication   GOUT 08/20/2010   Hammer toe of right foot    2-5th toes   Hx of umbilical hernia repair    HYPERLIPIDEMIA 08/21/2006   HYPERTENSION 08/21/2006   Infection of wound due to methicillin resistant Staphylococcus aureus (MRSA)    Internal hemorrhoids    Kidney problem    Multiple allergies 10/14/2016   OBESITY 06/04/2009   Onychomycosis 10/27/2015   Osteomyelitis of left foot (HCC) 05/29/2015   Osteoporosis    Pulmonary sarcoidosis (HCC)    Followed locally by pulmonology, but also by Dr. Sandy Sal[facility]am at [facility]   Right knee pain 01/26/2017   Vocal cord dysfunction    Wears partial dentures     Past Surgical History Past Surgical History:  Procedure Laterality Date   ABDOMINAL HYSTERECTOMY     APPENDECTOMY     BLADDER SUSPENSION  11/11/2011   Procedure: TRANSVAGINAL TAPE (TVT) PROCEDURE;  Surgeon: Levi Aland, MD;  Location: [facility];  Service: Gynecology;  Laterality: N/A;   CAROTIDS  02/18/2011   CAROTID DUPLEX; VERTEBRALS ARE PATENT WITH ANTEGRADE FLOW. ICA/CCA RATIO 1.61 ON RIGHT AND 0.75 ON LEFT   CATARACT EXTRACTION, BILATERAL     CHOLECYSTECTOMY  1984   COLONOSCOPY  04/29/2010   Marina Goodell   CYSTOSCOPY  11/11/2011   Procedure: CYSTOSCOPY;  Surgeon: Levi Aland, MD;  Location: [facility];  Service: Gynecology;  Laterality: N/A;   EXTUBATION (ENDOTRACHEAL) IN OR N/A 09/23/2017   Procedure: EXTUBATION (ENDOTRACHEAL) IN OR;  Surgeon: Graylin Shiver, MD;  Location: [facility];  Service: ENT;  Laterality: N/A;   FIBEROPTIC LARYNGOSCOPY AND TRACHEOSCOPY N/A 09/23/2017   Procedure: FLEXIBLE FIBEROPTIC LARYNGOSCOPY;  Surgeon: Graylin Shiver, MD;  Location: [facility];  Service: ENT;  Laterality: N/A;    FRACTURE SURGERY     foot   HALLUX FUSION Left 10/02/2018   Procedure: HALLUX ARTHRODESIS;  Surgeon: Felecia Shelling, DPM;  Location: [facility];  Service: Podiatry;  Laterality: Left;   HAMMER TOE SURGERY Left 10/02/2018   Procedure: HAMMER TOE CORRECTION 2ND 3RD 4RD FIFTH TOE;  Surgeon: Felecia Shelling, DPM;  Location: [facility];  Service: Podiatry;  Laterality: Left;   HAMMER TOE SURGERY Right 04/12/2019   Procedure: HAMMER TOE CORRECTION, 2ND, 3RD, 4TH AND 5TH TOES OF RIGHT FOOT;  Surgeon: Felecia Shelling, DPM;  Location: [facility];  Service: Podiatry;  Laterality: Right;   HAMMER TOE SURGERY Left 10/25/2019   Procedure: HAMMER TOE REPAIR SECOND, THIRD, FOUTH;  Surgeon: Felecia Shelling, DPM;  Location: [facility];  Service: Podiatry;  Laterality: Left;   HERNIA REPAIR     I & D EXTREMITY Left 06/27/2015   Procedure: Partial Excision Left Calcaneus, Place Antibiotic Beads, and Wound VAC;  Surgeon: Nadara Mustard, MD;  Location: [facility];  Service: Orthopedics;  Laterality: Left;   JOINT REPLACEMENT     KNEE ARTHROSCOPY     right   LEFT AND RIGHT HEART CATHETERIZATION WITH CORONARY ANGIOGRAM N/A 04/23/2013   Procedure: LEFT AND RIGHT HEART CATHETERIZATION WITH CORONARY ANGIOGRAM;  Surgeon: Marykay Lex, MD;  Location: [facility] CATH LAB;  Service: Cardiovascular;  Laterality: N/A;   LEFT HEART CATH AND CORONARY ANGIOGRAPHY N/A 03/11/2017   Procedure: Left Heart Cath and Coronary Angiography;  Surgeon: Tonny Bollman, MD;  Location: [facility] INVASIVE CV LAB; angiographically minimal CAD in the LAD otherwise normal.;  Normal LVEDP.  FALSE POSITIVE MYOVIEW   LEFT HEART CATH AND CORONARY ANGIOGRAPHY  07/20/2010   LVEF 50-55% WITH VERY MILD GLOBAL HYPOKINESIA; ESSENTIALLY NORMAL CORONARY ARTERIES; NORMAL LV FUNCTION   METATARSAL OSTEOTOMY WITH OPEN REDUCTION INTERNAL FIXATION (ORIF) METATARSAL WITH FUSION Left 04/09/2014   Procedure: LEFT FOOT FRACTURE OPEN TREATMENT METATARSAL INCLUDES INTERNAL FIXATION EACH;  Surgeon:  Nilda Simmer, MD;  Location: [facility];  Service: Orthopedics;  Laterality: Left;   NISSEN FUNDOPLICATION  2004   NM MYOVIEW LTD --> FALSE POSITIVE  03/10/2017   : Moderate size "stress-induced "perfusion defect at the apex as well as "ill-defined stress-induced perfusion defect in the lateral wall.  EF 55%.  INTERMEDIATE risk. -->  FALSE POSITIVE   ORIF DISTAL FEMUR FRACTURE  08/08/2020   [facility]: ORIF of left extra-articular periprosthetic distal femur fracture with synthesis of the femur plate with cortical nonlocking screws.  (Thought to be pathologic fracture due to osteoporosis) -> after a fall   ORIF FEMUR W/ PERI-IMPLANT  08/29/2020   [facility] revision of left femur fracture ORIF plate fixation screws; culture positive for MSSA   Right and left CARDIAC CATHETERIZATION  04/23/2013   Angiographic normal coronaries; LVEDP 20 mmHg, PCWP 12-14 mmHg, RAP 12 mmHg.; Fick CO/CI 4.9/2.2   ROTATOR CUFF REPAIR Right 12/09/2021   SHOULDER ARTHROSCOPY Left 03/14/2019   Procedure: LEFT SHOULDER ARTHROSCOPY, DEBRIDEMENT, AND DECOMPRESSION;  Surgeon: Nadara Mustard, MD;  Location: [facility];  Service: Orthopedics;  Laterality: Left;   TOTAL KNEE ARTHROPLASTY Right 06/29/2017   Procedure: RIGHT TOTAL KNEE ARTHROPLASTY;  Surgeon: Nadara Mustard, MD;  [facility] OR;  Service: Orthopedics;  Laterality: Right;   TOTAL KNEE ARTHROPLASTY Left 12/07/2017   TOTAL KNEE ARTHROPLASTY Left 12/07/2017   Procedure: LEFT TOTAL KNEE ARTHROPLASTY;  Surgeon: Nadara Mustard, MD;  Location: [facility] OR;  Service: Orthopedics;  Laterality: Left;   TRACHEOSTOMY TUBE PLACEMENT N/A 09/20/2017   Procedure: AWAKE INTUBATION WITH ANESTHESIA WITH VIDEO ASSISTANCE;  Surgeon: Graylin Shiver, MD;  Location[facility];  Service: ENT;  Laterality: N/A;   TRANSTHORACIC ECHOCARDIOGRAM  08/2014   Normal LV size and function.  Mild LVH.  EF 55-60%.  Normal regional wall motion.  GR 1 DD.  Normal RV  size and function .   TRANSTHORACIC ECHOCARDIOGRAM  08/12/2020    [facility]): Normal LV size and function.  EF 55 to 60%.  No RWM A.  Normal filling pattern.  Normal RV.  No significant valvular disease-stenosis or regurgitation.  No vegetation.   TUBAL LIGATION     with reversal in 1994   UPPER GASTROINTESTINAL ENDOSCOPY     VENTRAL HERNIA REPAIR      Family History family history includes Asthma in her mother; Breast cancer in her mother; COPD in her mother; Coronary artery disease in her father; Diabetes in her brother, father, and mother; Emphysema in her mother; Heart attack in her maternal grandfather; Heart attack (age of onset: 5) in her father; Heart disease in her father; Heart failure in her father and mother; Hypertension in her father; Kidney disease in her mother; Lung cancer in her brother; Obesity in her father; Sarcoidosis in her maternal  uncle; Sleep apnea in her father; Throat cancer in her father; Thyroid disease in her mother.  Social History Social History   Socioeconomic History   Marital status: Married    Spouse name: BEVAN VU   Number of children: 2   Years of education: 12   Highest education level: 12th grade  Occupational History   Occupation: DISABLED  Tobacco Use   Smoking status: Never   Smokeless tobacco: Never  Vaping Use   Vaping status: Never Used  Substance and Sexual Activity   Alcohol use: No    Alcohol/week: 0.0 standard drinks of alcohol   Drug use: No   Sexual activity: Yes    Birth control/protection: Surgical  Other Topics Concern   Not on file  Social History Narrative   Married 1994. 2 sons who both live close and 1 grandson.       Disability due to sarcoidosis. 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 [employer]   Social Drivers of Health   Financial Resource Strain: Low Risk   (10/10/2023)   Overall Financial Resource Strain (CARDIA)    Difficulty of Paying Living Expenses: Not hard at all  Food Insecurity: No Food Insecurity (10/10/2023)   Hunger Vital Sign    Worried About Running Out of Food in the Last Year: Never true    Ran Out of Food in the Last Year: Never true  Transportation Needs: No Transportation Needs (10/10/2023)   PRAPARE - Administrator, Civil Service (Medical): No    Lack of Transportation (Non-Medical): No  Physical Activity: Insufficiently Active (10/10/2023)   Exercise Vital Sign    Days of Exercise per Week: 3 days    Minutes of Exercise per Session: 30 min  Stress: No Stress Concern Present (10/10/2023)   [employer] of Occupational Health - Occupational Stress Questionnaire    Feeling of Stress : Not at all  Social Connections: Socially Integrated (10/10/2023)   Social Connection and Isolation Panel [NHANES]    Frequency of Communication with Friends and Family: More than three times a week    Frequency of Social Gatherings with Friends and Family: Twice a week    Attends Religious Services: More than 4 times per year    Active Member of Clubs or Organizations: No    Attends Engineer, structural: More than 4 times per year    Marital Status: Married  Catering manager Violence: Not At Risk (08/09/2023)   Humiliation, Afraid, Rape, and Kick questionnaire    Fear of Current or Ex-Partner: No    Emotionally Abused: No    Physically Abused: No    Sexually Abused: No    Lab Results  Component Value Date   HGBA1C 6.9 (A) 01/18/2024   HGBA1C 6.6 (A) 10/20/2023   HGBA1C 7.0 (A) 07/27/2023   Lab Results  Component Value Date   CHOL 151 10/20/2023   Lab Results  Component Value Date   HDL 50 10/20/2023   Lab Results  Component Value Date   LDLCALC 76 10/20/2023   Lab Results  Component Value Date   TRIG 157 (H) 10/20/2023   Lab Results  Component Value Date   CHOLHDL 3.0 10/20/2023   Lab  Results  Component Value Date   CREATININE 1.22 (H) 01/13/2024   Lab Results  Component Value Date   GFR 46.92 (  L) 01/13/2024   Lab Results  Component Value Date   MICROALBUR 1.4 10/20/2023      Component Value Date/Time   NA 138 01/13/2024 0854   NA 139 10/20/2020 0000   NA 135 (L) 04/17/2014 1039   K 4.9 01/13/2024 0854   K 4.5 04/17/2014 1039   CL 102 01/13/2024 0854   CO2 28 01/13/2024 0854   CO2 26 04/17/2014 1039   GLUCOSE 158 (H) 01/13/2024 0854   GLUCOSE 300 (H) 04/17/2014 1039   GLUCOSE 150 (H) 09/14/2006 1033   BUN 21 01/13/2024 0854   BUN 10 10/20/2020 0000   BUN 13.9 04/17/2014 1039   CREATININE 1.22 (H) 01/13/2024 0854   CREATININE 1.18 (H) 10/20/2023 1156   CREATININE 0.8 04/17/2014 1039   CALCIUM 9.5 01/13/2024 0854   CALCIUM 10.0 04/17/2014 1039   PROT 6.7 01/13/2024 0854   PROT 6.7 03/31/2020 1316   PROT 6.8 04/17/2014 1039   ALBUMIN 4.4 01/13/2024 0854   ALBUMIN 4.6 03/31/2020 1316   ALBUMIN 3.7 04/17/2014 1039   AST 19 01/13/2024 0854   AST 8 04/17/2014 1039   ALT 15 01/13/2024 0854   ALT 20 04/17/2014 1039   ALKPHOS 58 01/13/2024 0854   ALKPHOS 82 04/17/2014 1039   BILITOT 0.5 01/13/2024 0854   BILITOT 0.3 03/31/2020 1316   BILITOT 0.45 04/17/2014 1039   GFRNONAA >60 08/10/2023 0609   GFRNONAA 71 08/21/2020 1404   GFRAA 96 10/20/2020 0000   GFRAA 82 08/21/2020 1404      Latest Ref Rng & Units 01/13/2024    8:54 AM 12/05/2023   12:00 PM 10/20/2023   11:56 AM  BMP  Glucose 70 - 99 mg/dL 161  096  045   BUN 6 - 23 mg/dL 21  26  22    Creatinine 0.40 - 1.20 mg/dL 4.09  8.11  9.14   BUN/Creat Ratio 6 - 22 (calc)   19   Sodium 135 - 145 mEq/L 138  137  137   Potassium 3.5 - 5.1 mEq/L 4.9  4.9  5.1   Chloride 96 - 112 mEq/L 102  101  102   CO2 19 - 32 mEq/L 28  28  26    Calcium 8.4 - 10.5 mg/dL 9.5  9.7  78.2        Component Value Date/Time   WBC 10.0 01/13/2024 0854   RBC 3.68 (L) 01/13/2024 0854   HGB 11.0 (L) 01/13/2024 0854   HGB  12.1 04/17/2014 1039   HCT 32.8 (L) 01/13/2024 0854   HCT 36.9 04/17/2014 1039   PLT 331.0 01/13/2024 0854   PLT 436 (H) 04/17/2014 1039   MCV 89.1 01/13/2024 0854   MCV 87.9 04/17/2014 1039   MCH 30.3 08/10/2023 0609   MCHC 33.7 01/13/2024 0854   RDW 13.1 01/13/2024 0854   RDW 14.5 04/17/2014 1039   LYMPHSABS 2.6 01/13/2024 0854   LYMPHSABS 2.2 04/17/2014 1039   MONOABS 0.6 01/13/2024 0854   MONOABS 0.8 04/17/2014 1039   EOSABS 0.4 01/13/2024 0854   EOSABS 0.1 04/17/2014 1039   BASOSABS 0.1 01/13/2024 0854   BASOSABS 0.0 04/17/2014 1039     Parts of this note may have been dictated using voice recognition software. There may be variances in spelling and vocabulary which are unintentional. Not all errors are proofread. Please notify the Thereasa Parkin if any discrepancies are noted or if the meaning of any statement is not clear.

## 2024-01-30 ENCOUNTER — Other Ambulatory Visit: Payer: Self-pay | Admitting: "Endocrinology

## 2024-01-30 DIAGNOSIS — E1165 Type 2 diabetes mellitus with hyperglycemia: Secondary | ICD-10-CM

## 2024-02-01 ENCOUNTER — Emergency Department (HOSPITAL_BASED_OUTPATIENT_CLINIC_OR_DEPARTMENT_OTHER)

## 2024-02-01 ENCOUNTER — Other Ambulatory Visit: Payer: Self-pay

## 2024-02-01 ENCOUNTER — Encounter: Payer: Self-pay | Admitting: "Endocrinology

## 2024-02-01 ENCOUNTER — Emergency Department (HOSPITAL_BASED_OUTPATIENT_CLINIC_OR_DEPARTMENT_OTHER)
Admission: EM | Admit: 2024-02-01 | Discharge: 2024-02-01 | Disposition: A | Discharge status: Home / Self Care | Attending: Emergency Medicine | Admitting: Emergency Medicine

## 2024-02-01 ENCOUNTER — Encounter (HOSPITAL_BASED_OUTPATIENT_CLINIC_OR_DEPARTMENT_OTHER): Payer: Self-pay

## 2024-02-01 DIAGNOSIS — I10 Essential (primary) hypertension: Secondary | ICD-10-CM | POA: Diagnosis not present

## 2024-02-01 DIAGNOSIS — Z79899 Other long term (current) drug therapy: Secondary | ICD-10-CM | POA: Diagnosis not present

## 2024-02-01 DIAGNOSIS — Z7984 Long term (current) use of oral hypoglycemic drugs: Secondary | ICD-10-CM | POA: Diagnosis not present

## 2024-02-01 DIAGNOSIS — R1011 Right upper quadrant pain: Secondary | ICD-10-CM | POA: Diagnosis present

## 2024-02-01 DIAGNOSIS — E119 Type 2 diabetes mellitus without complications: Secondary | ICD-10-CM | POA: Insufficient documentation

## 2024-02-01 DIAGNOSIS — Z7982 Long term (current) use of aspirin: Secondary | ICD-10-CM | POA: Diagnosis not present

## 2024-02-01 DIAGNOSIS — Z794 Long term (current) use of insulin: Secondary | ICD-10-CM | POA: Diagnosis not present

## 2024-02-01 DIAGNOSIS — D72829 Elevated white blood cell count, unspecified: Secondary | ICD-10-CM | POA: Insufficient documentation

## 2024-02-01 DIAGNOSIS — K458 Other specified abdominal hernia without obstruction or gangrene: Secondary | ICD-10-CM | POA: Diagnosis not present

## 2024-02-01 LAB — BASIC METABOLIC PANEL WITH GFR
Anion gap: 16 — ABNORMAL HIGH (ref 5–15)
BUN: 19 mg/dL (ref 8–23)
CO2: 21 mmol/L — ABNORMAL LOW (ref 22–32)
Calcium: 9.6 mg/dL (ref 8.9–10.3)
Chloride: 103 mmol/L (ref 98–111)
Creatinine, Ser: 1.34 mg/dL — ABNORMAL HIGH (ref 0.44–1.00)
GFR, Estimated: 44 mL/min — ABNORMAL LOW (ref >60)
Glucose, Bld: 134 mg/dL — ABNORMAL HIGH (ref 70–99)
Potassium: 4.3 mmol/L (ref 3.5–5.1)
Sodium: 140 mmol/L (ref 135–145)

## 2024-02-01 LAB — URINALYSIS, ROUTINE W REFLEX MICROSCOPIC
Bilirubin Urine: NEGATIVE
Glucose, UA: NEGATIVE mg/dL
Hgb urine dipstick: NEGATIVE
Ketones, ur: NEGATIVE mg/dL
Leukocytes,Ua: NEGATIVE
Nitrite: NEGATIVE
Protein, ur: NEGATIVE mg/dL
Specific Gravity, Urine: 1.015 (ref 1.005–1.030)
pH: 5 (ref 5.0–8.0)

## 2024-02-01 LAB — CBC
HCT: 32.8 % — ABNORMAL LOW (ref 36.0–46.0)
Hemoglobin: 10.8 g/dL — ABNORMAL LOW (ref 12.0–15.0)
MCH: 29.6 pg (ref 26.0–34.0)
MCHC: 32.9 g/dL (ref 30.0–36.0)
MCV: 89.9 fL (ref 80.0–100.0)
Platelets: 366 10*3/uL (ref 150–400)
RBC: 3.65 MIL/uL — ABNORMAL LOW (ref 3.87–5.11)
RDW: 13 % (ref 11.5–15.5)
WBC: 12.1 10*3/uL — ABNORMAL HIGH (ref 4.0–10.5)
nRBC: 0 % (ref 0.0–0.2)

## 2024-02-01 MED ORDER — ONDANSETRON HCL 4 MG/2ML IJ SOLN
4.0000 mg | Freq: Once | INTRAMUSCULAR | Status: AC
  Administered 2024-02-01: 4 mg via INTRAVENOUS
  Filled 2024-02-01: qty 2

## 2024-02-01 MED ORDER — FENTANYL CITRATE PF 50 MCG/ML IJ SOSY
50.0000 ug | PREFILLED_SYRINGE | Freq: Once | INTRAMUSCULAR | Status: AC
  Administered 2024-02-01: 50 ug via INTRAVENOUS
  Filled 2024-02-01: qty 1

## 2024-02-01 NOTE — Discharge Instructions (Addendum)
 You had a hernia noted on your CT scan where you are having your pain.  This is likely the cause of your pain.  I talked to the general surgeon Dr. Andy Bannister, she would like you to follow-up with her outpatient.  Please call her office to schedule appointment within the next week.  Please avoid lifting anything heavy until you are seen by the general surgeon.  You also have an enlarged liver and spleen noted on your CT scan.  Please make your PCP aware of this finding.  I have included the CT scan results below for your reference.  You have an elevated creatinine which is a measure of your kidney function, however, this appears to be at your baseline.  You have normal electrolytes today.    You may take up to 1000mg  of tylenol  every 6 hours as needed for pain.  Do not take more then 4g per day.  Please return to the ER if you have any severe worsening of your pain, skin changes over your abdomen, vomiting, any other new or concerning symptoms.  "EXAM: CT ABDOMEN AND PELVIS WITHOUT CONTRAST   TECHNIQUE: Multidetector CT imaging of the abdomen and pelvis was performed following the standard protocol without IV contrast.   RADIATION DOSE REDUCTION: This exam was performed according to the departmental dose-optimization program which includes automated exposure control, adjustment of the mA and/or kV according to patient size and/or use of iterative reconstruction technique.   COMPARISON:  CT abdomen and pelvis 06/16/2023   FINDINGS: Lower chest: No acute abnormality.   Hepatobiliary: The liver is mildly enlarged. The liver appears diffusely heterogeneous. Gallbladder surgically absent. There is no biliary ductal dilatation.   Pancreas: Unremarkable. No pancreatic ductal dilatation or surrounding inflammatory changes.   Spleen: Spleen is mildly enlarged, unchanged.   Adrenals/Urinary Tract: Adrenal glands are unremarkable. Kidneys are normal, without renal calculi, focal lesion, or  hydronephrosis. Bladder is unremarkable.   Stomach/Bowel: Stomach is within normal limits. There are surgical changes at the gastroesophageal junction. Appendix is not seen. No evidence of bowel wall thickening, distention, or inflammatory changes.   Vascular/Lymphatic: Aortic atherosclerosis. No enlarged abdominal or pelvic lymph nodes.   Reproductive: Status post hysterectomy. No adnexal masses.   Other: There is no ascites. Anterior abdominal wall mesh is present. There is a small hernia in the anterior right upper quadrant containing fat and nondilated colon. This is seen at the level of the liver.   Musculoskeletal: No acute or significant osseous findings.   IMPRESSION: 1. No acute localizing process in the abdomen or pelvis. 2. Hepatosplenomegaly. 3. Heterogeneous appearance of the liver, nonspecific. 4. Small hernia in the anterior right upper quadrant containing fat and nondilated colon. Correlate clinically for point tenderness. 5. Aortic atherosclerosis.   Aortic Atherosclerosis (ICD10-I70.0).     Electronically Signed   By: Tyron Gallon M.D.   On: 02/01/2024 20:55"

## 2024-02-01 NOTE — ED Triage Notes (Signed)
 Pt reports right flank pain that radiates to right abdomen. Pain began suddenly 2-3 hours ago. Reports pressure with urination x 3 days

## 2024-02-01 NOTE — ED Provider Notes (Signed)
 [place] [facility] Provider Note   CSN: [id number] Arrival date & time: 02/01/24  1632     History {Add pertinent medical, surgical, social history, OB history to HPI:1} Chief Complaint  Patient presents with   Flank Pain    [name] is a 63 y.o. female with history of hypertension, type 2 diabetes   Flank Pain       Home Medications Prior to Admission medications   Medication Sig Start Date End Date Taking? Authorizing Provider  albuterol  (PROVENTIL ) (2.5 MG/3ML) 0.083% nebulizer solution Take 3 mLs (2.5 mg total) by nebulization every 6 (six) hours as needed for wheezing or shortness of breath (Use 2-3 times per day when sick.). 10/11/23   Versa Gore, NP  albuterol  (VENTOLIN  HFA) 108 (90 Base) MCG/ACT inhaler Inhale 1-2 puffs into the lungs every 4 (four) hours as needed for wheezing or shortness of breath. 01/02/24   Alexander Iba, PA  aspirin  EC 81 MG tablet Take 1 tablet (81 mg total) by mouth daily. 11/03/20   Arleen Lacer, MD  atorvastatin  (LIPITOR) 40 MG tablet Take 1 tablet (40 mg total) by mouth daily. 09/19/23   Almira Jaeger, MD  benzonatate  (TESSALON  PERLES) 100 MG capsule Take 1-2 capsules (100-200 mg total) by mouth 3 (three) times daily as needed for cough. 01/02/24   Alexander Iba, PA  BREO ELLIPTA  200-25 MCG/INH AEPB TAKE 1 PUFF BY MOUTH EVERY DAY Patient taking differently: Inhale 1 puff into the lungs daily. 06/04/19   Almira Jaeger, MD  buPROPion  (WELLBUTRIN  XL) 300 MG 24 hr tablet TAKE 1 TABLET BY MOUTH EVERY DAY 01/11/24   Almira Jaeger, MD  chlorpheniramine -HYDROcodone  (TUSSIONEX) 10-8 MG/5ML Take 5 mLs by mouth at bedtime as needed for cough (do not drive for 8 hours after taking). 08/03/23   Rodney Clamp, MD  Continuous Glucose Sensor (DEXCOM G7 SENSOR) MISC 1 DEVICE BY DOES NOT APPLY ROUTE AS DIRECTED. CHANGE SENSOR EVERY 10 DAYS 12/07/23   Jorge Newcomer, MD  cyclobenzaprine   (FLEXERIL ) 5 MG tablet Take 1 tablet (5 mg total) by mouth 3 (three) times daily as needed for muscle spasms (or Headache). 11/04/22   Versa Gore, NP  diltiazem  (CARDIZEM  CD) 120 MG 24 hr capsule Take 1 capsule (120 mg total) by mouth daily. 02/17/23   Goodrich, Callie E, PA-C  estradiol  (ESTRACE ) 0.1 MG/GM vaginal cream Place 0.5 Applicatorfuls vaginally 2 (two) times a week.    [provider]  famotidine  (PEPCID ) 20 MG tablet Take 1 tablet (20 mg total) by mouth 2 (two) times daily. 01/13/24   Almira Jaeger, MD  fenofibrate  160 MG tablet TAKE 1 TABLET BY MOUTH EVERY DAY 10/10/23   Arleen Lacer, MD  fluticasone  (FLONASE ) 50 MCG/ACT nasal spray Place 2 sprays into both nostrils daily as needed for allergies. 08/11/23   Althia Atlas, MD  furosemide  (LASIX ) 40 MG tablet Take 1 tablet (40 mg total) by mouth daily. 06/12/22   Abbe Abate, MD  gabapentin  (NEURONTIN ) 100 MG capsule TAKE 1 CAPSULE BY MOUTH THREE TIMES A DAY 09/06/23   Almira Jaeger, MD  glucose blood (CONTOUR NEXT TEST) test strip 1 each by Other route 4 (four) times daily. And lancets 4/day 09/19/19   Gwyndolyn Lerner, MD  guaiFENesin -dextromethorphan  (ROBITUSSIN DM) 100-10 MG/5ML syrup Take 5 mLs by mouth every 4 (four) hours as needed for cough (chest congestion). 08/11/23   Althia Atlas, MD  insulin  aspart (NOVOLOG  FLEXPEN)  100 UNIT/ML FlexPen Inject 15 Units into the skin daily with supper. 05/13/23   Motwani, Komal, MD  insulin  glargine-yfgn (SEMGLEE , YFGN,) 100 UNIT/ML Pen Inject 16 Units into the skin every morning. Patient taking differently: Inject 23 Units into the skin every morning. 10/18/23   Motwani, Komal, MD  Insulin  Pen Needle (PEN NEEDLES) 32G X 4 MM MISC 1 each by Does not apply route 4 (four) times daily. E11.9 02/22/20   Gwyndolyn Lerner, MD  ipratropium (ATROVENT ) 0.06 % nasal spray PLACE 2 SPRAYS INTO BOTH NOSTRILS 4 TIMES DAILY. 05/11/23   Almira Jaeger, MD  KLOR-CON  M20 20 MEQ tablet TAKE 1 &  1/2 TABLETS BY MOUTH TWICE A DAY 10/14/23   Almira Jaeger, MD  Lancet Devices (EASY MINI EJECT LANCING DEVICE) MISC Use as directed 4 times daily to test blood sugar 11/04/20   Gwyndolyn Lerner, MD  metFORMIN  (GLUCOPHAGE -XR) 500 MG 24 hr tablet TAKE 4 TABLETS BY MOUTH DAILY WITH BREAKFAST. 08/29/23   Motwani, Komal, MD  Microlet Lancets MISC 1 each by Does not apply route 2 (two) times daily. E11.9 10/03/23   Motwani, Komal, MD  ondansetron  (ZOFRAN -ODT) 4 MG disintegrating tablet Take 1 tablet (4 mg total) by mouth every 8 (eight) hours as needed for nausea or vomiting. 08/18/23   Almira Jaeger, MD  pantoprazole  (PROTONIX ) 40 MG tablet TAKE 1 TABLET BY MOUTH TWICE A DAY 08/29/23   Tobin Forts, MD  telmisartan  (MICARDIS ) 40 MG tablet TAKE 1 TABLET BY MOUTH EVERY DAY 12/19/23   Almira Jaeger, MD  tirzepatide [facility]) 7.5 MG/0.5ML Pen Inject 7.5 mg into the skin once a week. 01/18/24   Motwani, Komal, MD  venlafaxine  XR (EFFEXOR -XR) 75 MG 24 hr capsule TAKE 1 CAPSULE BY MOUTH TWICE A DAY 01/31/23   Almira Jaeger, MD  mupirocin  nasal ointment (BACTROBAN ) 2 % Place 1 application into the nose 2 (two) times daily. Use one-half of tube in each nostril twice daily for five (5) days. After application, press sides of nose together and gently massage. 03/28/18 03/28/18  Almira Jaeger, MD      Allergies    Firvanq  [vancomycin ], Trexall [methotrexate], Zestril  [lisinopril ], Chlorhexidine , Cleocin  [clindamycin ], Lincocin [lincomycin hcl], and Teflaro [ceftaroline]    Review of Systems   Review of Systems  Genitourinary:  Positive for flank pain.    Physical Exam Updated Vital Signs BP (!) 159/65   Pulse 75   Temp 97.7 F (36.5 C) (Oral)   Resp 18   Wt 94.8 kg   SpO2 98%   BMI 33.73 kg/m  Physical Exam  ED Results / Procedures / Treatments   Labs (all labs ordered are listed, but only abnormal results are displayed) Labs Reviewed  BASIC METABOLIC PANEL WITH GFR - Abnormal; Notable  for the following components:      Result Value   CO2 21 (*)    Glucose, Bld 134 (*)    Creatinine, Ser 1.34 (*)    GFR, Estimated 44 (*)    Anion gap 16 (*)    All other components within normal limits  CBC - Abnormal; Notable for the following components:   WBC 12.1 (*)    RBC 3.65 (*)    Hemoglobin 10.8 (*)    HCT 32.8 (*)    All other components within normal limits  URINALYSIS, ROUTINE W REFLEX MICROSCOPIC    EKG None  Radiology CT Renal Stone Study Result Date: 02/01/2024 CLINICAL DATA:  Right flank pain  EXAM: CT ABDOMEN AND PELVIS WITHOUT CONTRAST TECHNIQUE: Multidetector CT imaging of the abdomen and pelvis was performed following the standard protocol without IV contrast. RADIATION DOSE REDUCTION: This exam was performed according to the departmental dose-optimization program which includes automated exposure control, adjustment of the mA and/or kV according to patient size and/or use of iterative reconstruction technique. COMPARISON:  CT abdomen and pelvis 06/16/2023 FINDINGS: Lower chest: No acute abnormality. Hepatobiliary: The liver is mildly enlarged. The liver appears diffusely heterogeneous. Gallbladder surgically absent. There is no biliary ductal dilatation. Pancreas: Unremarkable. No pancreatic ductal dilatation or surrounding inflammatory changes. Spleen: Spleen is mildly enlarged, unchanged. Adrenals/Urinary Tract: Adrenal glands are unremarkable. Kidneys are normal, without renal calculi, focal lesion, or hydronephrosis. Bladder is unremarkable. Stomach/Bowel: Stomach is within normal limits. There are surgical changes at the gastroesophageal junction. Appendix is not seen. No evidence of bowel wall thickening, distention, or inflammatory changes. Vascular/Lymphatic: Aortic atherosclerosis. No enlarged abdominal or pelvic lymph nodes. Reproductive: Status post hysterectomy. No adnexal masses. Other: There is no ascites. Anterior abdominal wall mesh is present. There is a  small hernia in the anterior right upper quadrant containing fat and nondilated colon. This is seen at the level of the liver. Musculoskeletal: No acute or significant osseous findings. IMPRESSION: 1. No acute localizing process in the abdomen or pelvis. 2. Hepatosplenomegaly. 3. Heterogeneous appearance of the liver, nonspecific. 4. Small hernia in the anterior right upper quadrant containing fat and nondilated colon. Correlate clinically for point tenderness. 5. Aortic atherosclerosis. Aortic Atherosclerosis (ICD10-I70.0). Electronically Signed   By: Tyron Gallon M.D.   On: 02/01/2024 20:55    Procedures Procedures  {Document cardiac monitor, telemetry assessment procedure when appropriate:1}  Medications Ordered in ED Medications  fentaNYL  (SUBLIMAZE ) injection 50 mcg (50 mcg Intravenous Given 02/01/24 1949)  ondansetron  (ZOFRAN ) injection 4 mg (4 mg Intravenous Given 02/01/24 1950)    ED Course/ Medical Decision Making/ A&P   {   Click here for ABCD2, HEART and other calculatorsREFRESH Note before signing :1}                              Medical Decision Making Amount and/or Complexity of Data Reviewed Labs: ordered. Radiology: ordered.  Risk Prescription drug management.     Differential diagnosis includes but is not limited to ***  ED Course:  Upon initial evaluation, patient is ***  Labs Ordered: I Ordered, and personally interpreted labs.  The pertinent results include:  ***  Imaging Studies ordered: I ordered imaging studies including CT renal I independently visualized the imaging with scope of interpretation limited to determining acute life threatening conditions related to emergency care. Imaging showed  Hernia in the anterior right upper quadrant containing fat and nondilated colon Hepatosplenomegaly  I agree with the radiologist interpretation   Cardiac Monitoring: / EKG: The patient was maintained on a cardiac monitor.  I personally viewed and  interpreted the cardiac monitored which showed an underlying rhythm of: ***   Consultations Obtained: I requested consultation with the ***,  and discussed lab and imaging findings as well as pertinent plan - they recommend: ***  Medications Given: ***  Upon re-evaluation, patient ***.      Impression: ***  Disposition:  {AF ED Dispo:29713} Return precautions given.    Record Review: External records from outside source obtained and reviewed including ***     This chart was dictated using voice recognition software, Dragon. Despite the best efforts of  this provider to proofread and correct errors, errors may still occur which can change documentation meaning.    {Document critical care time when appropriate:1} {Document review of labs and clinical decision tools ie heart score, Chads2Vasc2 etc:1}  {Document your independent review of radiology images, and any outside records:1} {Document your discussion with family members, caretakers, and with consultants:1} {Document social determinants of health affecting pt's care:1} {Document your decision making why or why not admission, treatments were needed:1} Final Clinical Impression(s) / ED Diagnoses Final diagnoses:  None    Rx / DC Orders ED Discharge Orders     None

## 2024-02-02 NOTE — Telephone Encounter (Signed)
 Requested Prescriptions   Pending Prescriptions Disp Refills   metFORMIN  (GLUCOPHAGE -XR) 500 MG 24 hr tablet [Pharmacy Med Name: METFORMIN  HCL ER 500 MG TABLET] 360 tablet 0    Sig: TAKE 4 TABLETS BY MOUTH DAILY WITH BREAKFAST.

## 2024-02-08 ENCOUNTER — Other Ambulatory Visit: Payer: Self-pay | Admitting: Family Medicine

## 2024-02-08 ENCOUNTER — Ambulatory Visit (INDEPENDENT_AMBULATORY_CARE_PROVIDER_SITE_OTHER)

## 2024-02-08 ENCOUNTER — Ambulatory Visit: Payer: Self-pay | Admitting: General Surgery

## 2024-02-08 DIAGNOSIS — M2142 Flat foot [pes planus] (acquired), left foot: Secondary | ICD-10-CM

## 2024-02-08 DIAGNOSIS — M2042 Other hammer toe(s) (acquired), left foot: Secondary | ICD-10-CM

## 2024-02-08 DIAGNOSIS — M2141 Flat foot [pes planus] (acquired), right foot: Secondary | ICD-10-CM

## 2024-02-08 DIAGNOSIS — E1142 Type 2 diabetes mellitus with diabetic polyneuropathy: Secondary | ICD-10-CM | POA: Diagnosis not present

## 2024-02-08 DIAGNOSIS — M2041 Other hammer toe(s) (acquired), right foot: Secondary | ICD-10-CM | POA: Diagnosis not present

## 2024-02-08 NOTE — H&P (Signed)
 History of Present Illness: [name] is a 63 y.o. female who is seen today as an office consultation at the request of Dr. Room for evaluation of No chief complaint on file. Aaron Aas   History of Present Illness [name] is a 63 year old female who presents with abdominal pain. She was referred by Dr. Arlene Ben for evaluation of a hernia.  She experiences severe, constant abdominal pain for one to two months, primarily on the right side. The pain worsens at night, disrupts sleep, and is unrelieved by Tylenol . There is no nausea, vomiting, diarrhea, or constipation.  Her surgical history includes fundoplication for reflux, ventral hernia repair with mesh, and gallbladder removal with a drain tube. The current pain is near the gallbladder surgery scar.  She has diabetes managed with a Dexcom monitor, hypertension, and has lost significant weight from nearly 300 pounds to 204 pounds. Recent heavy lifting while helping friends move may have exacerbated her symptoms.    Pts sees Dr. Addie Holstein    Review of Systems: A complete review of systems was obtained from the patient.  I have reviewed this information and discussed as appropriate with the patient.  See HPI as well for other ROS.  Review of Systems  Constitutional: Negative.   HENT: Negative.    Eyes: Negative.   Respiratory: Negative.    Cardiovascular: Negative.   Gastrointestinal: Negative.   Genitourinary: Negative.   Musculoskeletal: Negative.   Skin: Negative.   Neurological: Negative.   Endo/Heme/Allergies: Negative.   Psychiatric/Behavioral: Negative.        Medical History: Past Medical History: Diagnosis Date  Arthritis   CHF (congestive heart failure) (CMS/HHS-HCC)   Chronic kidney disease   Hypertension    There is no problem list on file for this patient.   Past Surgical History: Procedure Laterality Date  CHOLECYSTECTOMY    HERNIA REPAIR    REPLACEMENT TOTAL KNEE       Allergies Allergen Reactions  Lisinopril  Anaphylaxis and Cough   Other reaction(s): Cough  Methotrexate Anaphylaxis and Other (See Comments)   Peri-oral and buccal lesions  Other reaction(s): SWELLING/EDEMA  REACTION: peri-oral and buccal lesions.  Vancomycin  Other (See Comments)   Dose related nephrotoxicity   nephrotoxicity  Chlorhexidine  Itching   Current Outpatient Medications on File Prior to Visit Medication Sig Dispense Refill  albuterol  (PROVENTIL ) 2.5 mg /3 mL (0.083 %) nebulizer solution Inhale 2.5 mg into the lungs    aspirin  81 MG EC tablet aspirin  81 mg tablet,delayed release  TAKE 2 TABLETS (162 MG TOTAL) BY MOUTH DAILY FOR 30 DAYS.    atorvastatin  (LIPITOR) 40 MG tablet Take 40 mg by mouth once daily    benzonatate  (TESSALON ) 100 MG capsule Take 100-200 mg by mouth    buPROPion  (WELLBUTRIN  XL) 300 MG XL tablet Take 1 tablet by mouth once daily    cyclobenzaprine  (FLEXERIL ) 5 MG tablet Take 5 mg by mouth    dilTIAZem  (CARDIZEM  CD) 120 MG XR capsule Take 120 mg by mouth once daily    estradioL  (ESTRACE ) 0.01 % (0.1 mg/gram) vaginal cream Place 0.5 Applicatorfuls vaginally    famotidine  (PEPCID ) 20 MG tablet Take 1 tablet by mouth 2 (two) times daily    fenofibrate  160 MG tablet Take 1 tablet by mouth once daily    fluticasone  propionate (FLONASE ) 50 mcg/actuation nasal spray Place 2 sprays into one nostril    FUROsemide  (LASIX ) 40 MG tablet Take 40 mg by mouth once daily    gabapentin  (NEURONTIN ) 100 MG  capsule Take 1 capsule by mouth 3 (three) times daily    HYDROcodone -chlorpheniramine  (TUSSIONEX) 10-8 mg/5 mL ER suspension Take 5 mLs by mouth    ipratropium (ATROVENT ) 0.06 % nasal spray PLACE 2 SPRAYS INTO BOTH NOSTRILS 4 TIMES DAILY.    KLOR-CON  M20 20 mEq ER tablet TAKE 1 & 1/2 TABLETS BY MOUTH TWICE A DAY    LANTUS  SOLOSTAR U-100 INSULIN  pen injector (concentration 100 units/mL) INJECT 16 UNITS INTO THE SKIN EVERY MORNING.    metFORMIN  (GLUCOPHAGE -XR) 500 MG  XR tablet Take 4 tablets by mouth daily with breakfast    MOUNJARO 7.5 mg/0.5 mL pen injector Inject 7.5 mg subcutaneously    NOVOLOG  FLEXPEN U-100 INSULIN  pen injector (concentration 100 units/mL) Inject 15 Units subcutaneously    ondansetron  (ZOFRAN -ODT) 4 MG disintegrating tablet Take 4 mg by mouth every 8 (eight) hours as needed    pantoprazole  (PROTONIX ) 40 MG DR tablet Take 1 tablet by mouth 2 (two) times daily    telmisartan  (MICARDIS ) 40 MG tablet Take 1 tablet by mouth once daily    tiZANidine  (ZANAFLEX ) 4 MG tablet tizanidine  4 mg tablet  TAKE 0.5-1 TABLETS (2-4 MG TOTAL) BY MOUTH EVERY 8 (EIGHT) HOURS AS NEEDED FOR MUSCLE SPASMS.    venlafaxine  (EFFEXOR -XR) 75 MG XR capsule Take 1 capsule by mouth 2 (two) times daily    No current facility-administered medications on file prior to visit.   Family History Problem Relation Age of Onset  High blood pressure (Hypertension) Mother   Diabetes Mother   Skin cancer Father   Obesity Father   High blood pressure (Hypertension) Father   Hyperlipidemia (Elevated cholesterol) Father   Diabetes Father   Myocardial Infarction (Heart attack) Father   Obesity Sister   Diabetes Brother   Obesity Brother     Social History  Tobacco Use Smoking Status Never Smokeless Tobacco Never    Social History  Socioeconomic History  Marital status: Married Tobacco Use  Smoking status: Never  Smokeless tobacco: Never  Social Drivers of Catering manager Strain: Low Risk  (10/10/2023)  Received from [facility]  Overall Financial Resource Strain (CARDIA)   Difficulty of Paying Living Expenses: Not hard at all Food Insecurity: No Food Insecurity (10/10/2023)  Received from [facility]  Hunger Vital Sign   Worried About Running Out of Food in the Last Year: Never true   Ran Out of Food in the Last Year: Never true Transportation Needs: No Transportation Needs (10/10/2023)  Received from [facility] -  Transportation   Lack of Transportation (Medical): No   Lack of Transportation (Non-Medical): No Physical Activity: Insufficiently Active (10/10/2023)  Received from [facility]  Exercise Vital Sign   Days of Exercise per Week: 3 days   Minutes of Exercise per Session: 30 min Stress: No Stress Concern Present (10/10/2023)  [facility] from [facility]ational [facility] - Occupational Stress Questionnaire   Feeling of Stress : Not at all Social Connections: Socially Integrated (10/10/2023)  Received from [facility]  Social Connection and Isolation Panel [NHANES]   Frequency of Communication with Friends and Family: More than three times a week   Frequency of Social Gatherings with Friends and Family: Twice a week   Attends Religious Services: More than 4 times per year   Active Member of [employer] or Organizations: No   Attends Engineer, structural: More than 4 times per year   Marital Status: Married Housing Stability: Unknown (02/08/2024)  Housing  Stability Vital Sign   Homeless in the Last Year: No   Objective:   Vitals:  02/08/24 1343 BP: (!) 160/76 Pulse: 93 Weight: 92.9 kg (204 lb 12.8 oz) Height: 167.6 cm (5\' 6" ) PainSc:   8   Body mass index is 33.06 kg/m.  Physical Exam Constitutional:      Appearance: Normal appearance.  HENT:     Head: Normocephalic and atraumatic.     Mouth/Throat:     Mouth: Mucous membranes are moist.     Pharynx: Oropharynx is clear.  Eyes:     General: No scleral icterus.    Pupils: Pupils are equal, round, and reactive to light.  Cardiovascular:     Rate and Rhythm: Normal rate and regular rhythm.     Pulses: Normal pulses.     Heart sounds: No murmur heard.    No friction rub. No gallop.  Pulmonary:     Effort: Pulmonary effort is normal. No respiratory distress.     Breath sounds: Normal breath sounds. No stridor.  Abdominal:     General: Abdomen is flat.     Hernia: A hernia is present.      Musculoskeletal:        General: No swelling.  Skin:    General: Skin is warm.  Neurological:     General: No focal deficit present.     Mental Status: She is alert and oriented to person, place, and time. Mental status is at baseline.  Psychiatric:        Mood and Affect: Mood normal.        Thought Content: Thought content normal.        Judgment: Judgment normal.      Hernia Size:2cm Incarcerated: no Yes Initial Hernia   Assessment and Plan: Diagnoses and all orders for this visit:  Incisional hernia without obstruction or gangrene    Madelinne Obryant is a 63 y.o. female  PT will require Cardiac eval prior to scheduling surgery  1.  We will proceed to the OR for a lap ventral hernia repair with mesh. 2. All risks and benefits were discussed with the patient, to generally include infection, bleeding, damage to surrounding structures, acute and chronic nerve pain, and recurrence. Alternatives were offered and described.  All questions were answered and the patient voiced understanding of the procedure and wishes to proceed at this point.       No follow-ups on file.  Shela Derby, MD, [facility], [place] [facility]

## 2024-02-08 NOTE — Progress Notes (Signed)
 Patient presents today to pick up diabetic shoes and insoles.  Patient was dispensed 1 pair of diabetic shoes and 3 pairs of foam casted diabetic insoles. Fit was satisfactory. Instructions for break-in and wear was reviewed and a copy was given to the patient.   Re-appointment for regularly scheduled diabetic foot care visits or if they should experience any trouble with the shoes or insoles.

## 2024-02-09 ENCOUNTER — Encounter: Payer: Self-pay | Admitting: Family Medicine

## 2024-02-09 ENCOUNTER — Telehealth (HOSPITAL_BASED_OUTPATIENT_CLINIC_OR_DEPARTMENT_OTHER): Payer: Self-pay | Admitting: *Deleted

## 2024-02-09 ENCOUNTER — Ambulatory Visit (INDEPENDENT_AMBULATORY_CARE_PROVIDER_SITE_OTHER): Admitting: Family Medicine

## 2024-02-09 VITALS — BP 130/60 | HR 92 | Temp 97.4°F | Ht 66.0 in | Wt 203.8 lb

## 2024-02-09 DIAGNOSIS — I5032 Chronic diastolic (congestive) heart failure: Secondary | ICD-10-CM | POA: Diagnosis not present

## 2024-02-09 DIAGNOSIS — K439 Ventral hernia without obstruction or gangrene: Secondary | ICD-10-CM

## 2024-02-09 DIAGNOSIS — D860 Sarcoidosis of lung: Secondary | ICD-10-CM | POA: Diagnosis not present

## 2024-02-09 DIAGNOSIS — E785 Hyperlipidemia, unspecified: Secondary | ICD-10-CM

## 2024-02-09 DIAGNOSIS — E1169 Type 2 diabetes mellitus with other specified complication: Secondary | ICD-10-CM

## 2024-02-09 DIAGNOSIS — E1122 Type 2 diabetes mellitus with diabetic chronic kidney disease: Secondary | ICD-10-CM

## 2024-02-09 DIAGNOSIS — Z794 Long term (current) use of insulin: Secondary | ICD-10-CM

## 2024-02-09 DIAGNOSIS — I110 Hypertensive heart disease with heart failure: Secondary | ICD-10-CM | POA: Diagnosis not present

## 2024-02-09 DIAGNOSIS — N1831 Chronic kidney disease, stage 3a: Secondary | ICD-10-CM

## 2024-02-09 NOTE — Progress Notes (Signed)
 Phone [phone] In person visit   Subjective:   [name] is a 63 y.o. year old very pleasant female patient who presents for/with See problem oriented charting Chief Complaint  Patient presents with   hosp f/u    [name] here for hosp f/u due to abdominal hernia and was also told that spleen and liver are enlarged.    Past Medical History-  Patient Active Problem List   Diagnosis Date Noted   Severe persistent asthma 01/04/2019    Priority: High   Subcortical microvascular ischemic occlusive disease 07/12/2018    Priority: High   Chronic diastolic CHF (congestive heart failure) (HCC) 08/13/2017    Priority: High   Pulmonary sarcoidosis (HCC)     Priority: High   Chronic bronchitis (HCC) in never smoker 09/18/2013    Priority: High   Chest pain 04/11/2013    Priority: High   Hypertensive heart disease with chronic diastolic congestive heart failure (HCC)     Priority: High   Sarcoidosis of lung (HCC) 04/10/2007    Priority: High   Type II diabetes mellitus with renal manifestations (HCC) 08/21/2006    Priority: High   Iron  deficiency anemia 10/01/2020    Priority: Medium    Hypertriglyceridemia 04/02/2020    Priority: Medium    Aortic atherosclerosis (HCC) 02/14/2020    Priority: Medium    Constipation 04/21/2018    Priority: Medium    Epiglottitis     Priority: Medium    Osteoarthritis of right knee 01/26/2017    Priority: Medium    CKD (chronic kidney disease) stage 3, GFR 30-59 ml/min (HCC) 04/27/2015    Priority: Medium    History of osteomyelitis 03/27/2015    Priority: Medium    Fatty liver 09/30/2014    Priority: Medium    Major depression in full remission (HCC)     Priority: Medium    Gout 08/20/2010    Priority: Medium    Deficiency anemia 09/18/2009    Priority: Medium    Sleep apnea 04/21/2009    Priority: Medium    Hyperlipidemia associated with type 2 diabetes mellitus (HCC) 08/21/2006    Priority: Medium    Hypertension 08/21/2006     Priority: Medium    GERD 08/21/2006    Priority: Medium    Nontraumatic complete tear of left rotator cuff     Priority: Low   Diabetic neuropathy (HCC) 02/20/2019    Priority: Low   Rapid palpitations 07/12/2018    Priority: Low   Impingement of left ankle joint 06/26/2018    Priority: Low   Total knee replacement status, left 12/07/2017    Priority: Low   Dysphagia 09/20/2017    Priority: Low   Plantar fasciitis, right 07/13/2017    Priority: Low   S/P total knee arthroplasty, right 06/29/2017    Priority: Low   Osteoarthrosis, localized, primary, knee, right     Priority: Low   Sprain of calcaneofibular ligament of right ankle 06/06/2017    Priority: Low   Onychomycosis 10/27/2015    Priority: Low   MRSA (methicillin resistant staph aureus) culture positive 03/27/2015    Priority: Low   Hot flashes 07/15/2014    Priority: Low   Abnormal SPEP 04/17/2014    Priority: Low   Fracture of left leg 04/17/2014    Priority: Low   Internal hemorrhoids     Priority: Low   Preoperative clearance 03/25/2014    Priority: Low   Solitary pulmonary nodule, on CT 02/2013 -  stable over 2 years in 2015 02/20/2013    Priority: Low   Chronic pain of left wrist 04/19/2022    Priority: 1.   Facet arthropathy, cervical 06/29/2021    Priority: 1.   Foraminal stenosis of cervical region 06/29/2021    Priority: 1.   Preop cardiovascular exam 12/07/2021   Postural dizziness with presyncope 09/30/2020    Medications- reviewed and updated Current Outpatient Medications  Medication Sig Dispense Refill   albuterol  (PROVENTIL ) (2.5 MG/3ML) 0.083% nebulizer solution Take 3 mLs (2.5 mg total) by nebulization every 6 (six) hours as needed for wheezing or shortness of breath (Use 2-3 times per day when sick.). 75 mL 2   albuterol  (VENTOLIN  HFA) 108 (90 Base) MCG/ACT inhaler Inhale 1-2 puffs into the lungs every 4 (four) hours as needed for wheezing or shortness of breath. 8.5 each 1   aspirin   EC 81 MG tablet Take 1 tablet (81 mg total) by mouth daily. 30 tablet 11   atorvastatin  (LIPITOR) 40 MG tablet Take 1 tablet (40 mg total) by mouth daily. 90 tablet 3   BREO ELLIPTA  200-25 MCG/INH AEPB TAKE 1 PUFF BY MOUTH EVERY DAY (Patient taking differently: Inhale 1 puff into the lungs daily.) 60 each 5   buPROPion  (WELLBUTRIN  XL) 300 MG 24 hr tablet TAKE 1 TABLET BY MOUTH EVERY DAY 90 tablet 3   Continuous Glucose Sensor (DEXCOM G7 SENSOR) MISC 1 DEVICE BY DOES NOT APPLY ROUTE AS DIRECTED. CHANGE SENSOR EVERY 10 DAYS 3 each 3   diltiazem  (CARDIZEM  CD) 120 MG 24 hr capsule Take 1 capsule (120 mg total) by mouth daily. 90 capsule 3   estradiol  (ESTRACE ) 0.1 MG/GM vaginal cream Place 0.5 Applicatorfuls vaginally 2 (two) times a week.     famotidine  (PEPCID ) 20 MG tablet TAKE 1 TABLET BY MOUTH TWICE A DAY 180 tablet 1   fenofibrate  160 MG tablet TAKE 1 TABLET BY MOUTH EVERY DAY 90 tablet 1   fluticasone  (FLONASE ) 50 MCG/ACT nasal spray Place 2 sprays into both nostrils daily as needed for allergies. 48 mL 3   furosemide  (LASIX ) 40 MG tablet Take 1 tablet (40 mg total) by mouth daily.     gabapentin  (NEURONTIN ) 100 MG capsule TAKE 1 CAPSULE BY MOUTH THREE TIMES A DAY 270 capsule 3   glucose blood (CONTOUR NEXT TEST) test strip 1 each by Other route 4 (four) times daily. And lancets 4/day 400 each 3   insulin  aspart (NOVOLOG  FLEXPEN) 100 UNIT/ML FlexPen Inject 15 Units into the skin daily with supper. 15 mL 11   insulin  glargine-yfgn (SEMGLEE , YFGN,) 100 UNIT/ML Pen Inject 16 Units into the skin every morning. (Patient taking differently: Inject 23 Units into the skin every morning.) 30 mL 2   Insulin  Pen Needle (PEN NEEDLES) 32G X 4 MM MISC 1 each by Does not apply route 4 (four) times daily. E11.9 400 each 0   ipratropium (ATROVENT ) 0.06 % nasal spray PLACE 2 SPRAYS INTO BOTH NOSTRILS 4 TIMES DAILY. 45 mL 3   KLOR-CON  M20 20 MEQ tablet TAKE 1 & 1/2 TABLETS BY MOUTH TWICE A DAY 270 tablet 2    Lancet Devices (EASY MINI EJECT LANCING DEVICE) MISC Use as directed 4 times daily to test blood sugar 1 each 3   metFORMIN  (GLUCOPHAGE -XR) 500 MG 24 hr tablet TAKE 4 TABLETS BY MOUTH DAILY WITH BREAKFAST. 360 tablet 0   Microlet Lancets MISC 1 each by Does not apply route 2 (two) times daily. E11.9 100 each 2  ondansetron  (ZOFRAN -ODT) 4 MG disintegrating tablet Take 1 tablet (4 mg total) by mouth every 8 (eight) hours as needed for nausea or vomiting. 20 tablet 0   pantoprazole  (PROTONIX ) 40 MG tablet TAKE 1 TABLET BY MOUTH TWICE A DAY 180 tablet 1   telmisartan  (MICARDIS ) 40 MG tablet TAKE 1 TABLET BY MOUTH EVERY DAY 90 tablet 3   tirzepatide (MOUNJARO) 7.5 MG/0.5ML Pen Inject 7.5 mg into the skin once a week. 6 mL 0   venlafaxine  XR (EFFEXOR -XR) 75 MG 24 hr capsule TAKE 1 CAPSULE BY MOUTH TWICE A DAY 180 capsule 3   benzonatate  (TESSALON  PERLES) 100 MG capsule Take 1-2 capsules (100-200 mg total) by mouth 3 (three) times daily as needed for cough. (Patient not taking: Reported on 02/09/2024) 30 capsule 3   chlorpheniramine -HYDROcodone  (TUSSIONEX) 10-8 MG/5ML Take 5 mLs by mouth at bedtime as needed for cough (do not drive for 8 hours after taking). (Patient not taking: Reported on 02/09/2024) 115 mL 0   cyclobenzaprine  (FLEXERIL ) 5 MG tablet Take 1 tablet (5 mg total) by mouth 3 (three) times daily as needed for muscle spasms (or Headache). (Patient not taking: Reported on 02/09/2024) 30 tablet 0   guaiFENesin -dextromethorphan  (ROBITUSSIN DM) 100-10 MG/5ML syrup Take 5 mLs by mouth every 4 (four) hours as needed for cough (chest congestion). (Patient not taking: Reported on 02/09/2024) 118 mL 0   No current facility-administered medications for this visit.     Objective:  BP 130/60   Pulse 92   Temp (!) 97.4 F (36.3 C)   Ht 5\' 6"  (1.676 m)   Wt 203 lb 12.8 oz (92.4 kg)   SpO2 95%   BMI 32.89 kg/m   CV: RRR  Lungs: nonlabored, normal respiratory rate-lungs largely clear Abdomen:  soft/nondistended  Abdomen: soft/nontender other than pain in the right upper quadrant under the ribs more laterally/nondistended/normal bowel sounds. No rebound or guarding.  Ext: no edema Skin: warm, dry     Assessment and Plan    # Emergency department follow-up for hernia right upper quadrant abdomen S: Patient presented with right-sided flank pain and right upper quadrant pain that started 2 to 3 hours before the visit.  No nausea, vomiting, fevers, diarrhea, dysuria, hematuria, increased urinary frequency-had leukocytosis of 12.1 thousand, hemoglobin largely stable around 10.8 which appears to be her baseline and creatinine at 1.34 which appeared to be her baseline urinalysis without signs of infection-CT renal study was completed showing hernia in the right upper quadrant containing fat and nondilated colon as well as hepatosplenomegaly (known fatty liver), both stable and unchanged from prior imaging such as CT 06/16/2023)-spleen was only noted as mildly enlarged -General Surgery Dr. Melton Squires recommended outpatient follow-up with them and to avoid heavy lifting -she saw surgeon today and they are recommending operative repair A/P: right upper quadrant abdomen hernia containing bowel- in need of operative repair- I agree with this especially with how bad her pain was and could be recurrent in future- she knows if pain does recur and doesn't relent to go back to Emergency Department but the hope is for outpatient instead of emergent surgery. Will attempt laparacopic -  she is pending cardiology clearance already- likely wil need pulmonary and endocrine input as well but I agree need for surgery  # Reflecting back to last visit- thankfully reflux calmed down with adding the Pepcid - came off and hasn't recurred and urinary tract infection was treated with fosfomycin previously-no recurrence of symptoms  # Her other chronic medical conditions  are largely stable-see notes below  # Depression/hot  flashes- Compliant with Wellbutrin  300 xr, Effexor  xr 75mg    # hypertension/hypertensive heart disease with chronic diastolic CHF/CKD stage III S: compliant with Lasix  40 mg daily (also potassium 20 meq), diltiazem  extended release 120 mg,  telmisartan  40 mg  Renal function stable with GFR of 44 in emergency department labs A/P: Blood pressure well-controlled-continue current medication  Chronic diastolic CHF-follows closely with cardiology-Lasix  appears helpful-continue current medication and cardiology follow-up  # hyperlipidemia #aortic atherosclerosis S:compliant with atorvastatin  40 mg, fenofibrate  160mg .  -Despite this triglycerides remain high and she was started on Vascepa  by cardiology but not covered by insurance  she also takes aspirin  for primary prevention Lab Results  Component Value Date   CHOL 151 10/20/2023   HDL 50 10/20/2023   LDLCALC 76 10/20/2023   LDLDIRECT 84.0 07/21/2021   TRIG 157 (H) 10/20/2023   CHOLHDL 3.0 10/20/2023   A/P: LDL mildly elevated-he is referred to work on diet and exercise-the same for her triglycerides. Aortic atherosclerosis (presumed stable)- LDL goal ideally <70-to stay above goal  #Sarcoidosis of lung- prior seen by [facility]-follows up with Dr. Waylan Haggard now Obstructive chronic bronchitis without exacerbation /Severe persistent asthma without complication S: Medication:  breo 200-25mg  1 puff every day.  Prior long-term prednisone  but stopped due to osteoporosis.  Albuterol  helpful as needed A/P: Sarcoidosis and other pulmonary disease overall stable-continue current medication-will need pulmonology input surgery slightly  #Type 2 diabetes mellitus/morbid obesity-sees Dr. Allyson Ares has been well-controlled with combination of Semglee  long-acting plus aspart short acting plus metformin  2000 mg extended release daily and Mounjaro 7.5 mg.  Florence Hunt also helpful with weight loss-I congratulated her on her progress and is down several pounds today   Lab Results  Component Value Date   HGBA1C 6.9 (A) 01/18/2024   HGBA1C 6.6 (A) 10/20/2023   HGBA1C 7.0 (A) 07/27/2023   #Diabetic polyneuropathy associated with type 2 diabetes mellitus #fatty liver- patient compliant with gabapentin  100mg  3x a day for neuropathy with extra 300 mg at night.   Recommended follow up: Return for next already scheduled visit or sooner if needed. Also if need to push that out a few months that's fine with me as well- just see how you are doing Future Appointments  Date Time Provider Department Center  02/23/2024  9:30 AM Parrett, Macdonald Savoy, NP [facility] None  03/12/2024 11:00 AM Almira Jaeger, MD [facility]  03/13/2024  8:00 AM Arleen Lacer, MD [facility] H[facility]  03/21/2024 11:20 AM [facility] ANNUAL WELLNESS VISIT 1 [facility]  04/12/2024 10:15 AM [facility] None  04/18/2024 10:20 AM Motwani, Joanna Muck, MD [facility] None    Lab/Order associations:   ICD-10-CM   1. Abdominal wall hernia  K43.9     2. Chronic diastolic CHF (congestive heart failure) (HCC)  I50.32     3. Hypertensive heart disease with chronic diastolic congestive heart failure (HCC)  I11.0    I50.32     4. Sarcoidosis of lung (HCC)  D86.0     5. Type 2 diabetes mellitus with stage 3a chronic kidney disease, with long-term current use of insulin  (HCC)  E11.22    N18.31    Z79.4     6. Stage 3a chronic kidney disease (HCC)  N18.31     7. Hyperlipidemia associated with type 2 diabetes mellitus (HCC)  E11.69    E78.5       No orders of the defined types were placed in this encounter.  Return precautions advised.  Clarisa Crooked, MD

## 2024-02-09 NOTE — Telephone Encounter (Signed)
   Pre-operative Risk Assessment    Patient Name: [name]  DOB: 1961/08/21 MRN: [id number]   Date of last office visit: 03/13/2024 Date of next office visit: None  Request for Surgical Clearance    Procedure:   Hernia Surgery  Date of Surgery:  Clearance TBD                                 Surgeon:  Dr. Shela Derby Surgeon's Group or Practice Name:  [facility] Phone number:  [phone] Fax number:  [phone]   Type of Clearance Requested:   - Medical  - Pharmacy:  Hold Aspirin  Not Indicated   Type of Anesthesia:  General    Additional requests/questions:    Signed, Lauris Port   02/09/2024, 8:36 AM

## 2024-02-09 NOTE — Patient Instructions (Addendum)
 I wish you the best with the surgery. Dr. Melton Squires is excellent and that makes me feel confident in care you will get.  - they will likely send me a form and we may get input from endo and pulmonary as well- but cardiology clearance is already pending  Glad reflux is better   Recommended follow up: Return for next already scheduled visit or sooner if needed. Also if need to push that out a few months that's fine with me as well- just see how you are doing

## 2024-02-10 NOTE — Telephone Encounter (Signed)
   Name: [name]  DOB: 03/26/1961  MRN: [id number]  Primary Cardiologist: Randene Bustard, MD  Chart reviewed as part of pre-operative protocol coverage. The patient has an upcoming visit scheduled with Dr. Addie Holstein on 03/13/2024 at which time clearance can be addressed in case there are any issues that would impact surgical recommendations.  Hernia surgery is not scheduled until TBD as below. I added preop FYI to appointment note so that provider is aware to address at time of outpatient visit.  Per office protocol the cardiology provider should forward their finalized clearance decision and recommendations regarding antiplatelet therapy to the requesting party below.    She may hold aspirin  for 5-7 days prior to procedure. Please resume aspirin  as soon as possible postprocedure, at the discretion of the surgeon.    I will route this message as FYI to requesting party and remove this message from the preop box as separate preop APP input not needed at this time.   Please call with any questions.  Ava Boatman, NP  02/10/2024, 11:52 AM

## 2024-02-20 ENCOUNTER — Encounter: Payer: Self-pay | Admitting: Family Medicine

## 2024-02-20 ENCOUNTER — Other Ambulatory Visit: Payer: Self-pay

## 2024-02-20 ENCOUNTER — Ambulatory Visit: Payer: Medicare Other | Admitting: Adult Health

## 2024-02-20 ENCOUNTER — Encounter: Payer: Self-pay | Admitting: Cardiology

## 2024-02-20 MED ORDER — FUROSEMIDE 40 MG PO TABS: 40.0000 mg | ORAL_TABLET | Freq: Every day | ORAL | 3 refills | Status: AC

## 2024-02-23 ENCOUNTER — Ambulatory Visit: Attending: Nurse Practitioner | Admitting: Nurse Practitioner

## 2024-02-23 ENCOUNTER — Ambulatory Visit: Admitting: Adult Health

## 2024-02-23 ENCOUNTER — Encounter: Payer: Self-pay | Admitting: Nurse Practitioner

## 2024-02-23 VITALS — BP 140/68 | HR 77 | Ht 66.0 in | Wt 202.0 lb

## 2024-02-23 DIAGNOSIS — I2510 Atherosclerotic heart disease of native coronary artery without angina pectoris: Secondary | ICD-10-CM

## 2024-02-23 DIAGNOSIS — E785 Hyperlipidemia, unspecified: Secondary | ICD-10-CM

## 2024-02-23 DIAGNOSIS — I5032 Chronic diastolic (congestive) heart failure: Secondary | ICD-10-CM | POA: Diagnosis not present

## 2024-02-23 DIAGNOSIS — I10 Essential (primary) hypertension: Secondary | ICD-10-CM | POA: Diagnosis not present

## 2024-02-23 DIAGNOSIS — Z01810 Encounter for preprocedural cardiovascular examination: Secondary | ICD-10-CM

## 2024-02-23 DIAGNOSIS — E669 Obesity, unspecified: Secondary | ICD-10-CM

## 2024-02-23 DIAGNOSIS — J449 Chronic obstructive pulmonary disease, unspecified: Secondary | ICD-10-CM

## 2024-02-23 DIAGNOSIS — E1169 Type 2 diabetes mellitus with other specified complication: Secondary | ICD-10-CM

## 2024-02-23 DIAGNOSIS — D860 Sarcoidosis of lung: Secondary | ICD-10-CM

## 2024-02-23 NOTE — Progress Notes (Signed)
 Office Visit    Patient Name: [name] Date of Encounter: 02/23/2024  Primary Care Provider:  Almira Jaeger, MD Primary Cardiologist:  Randene Bustard, MD  Chief Complaint    63 year old female with a history of nonobstructive CAD chronic diastolic heart failure, hypertension, hyperlipidemia, pulmonary sarcoidosis, COPD, type 2 diabetes, fatty liver disease, varicose veins, vocal cord dysfunction, and GERD who presents for follow-up related to CAD and hypertension and for preoperative cardiac evaluation.  Past Medical History    Past Medical History:  Diagnosis Date   Abnormal SPEP 04/17/2014   Acute left ankle pain 01/26/2017   Allergy    ANEMIA-UNSPECIFIED 09/18/2009   Anxiety    Arthritis    Blood transfusion without reported diagnosis    Cataract    CHF (congestive heart failure) (HCC)    Chronic diastolic heart failure, NYHA class 2 (HCC)    Normal LVEDP by May 2018   COPD (chronic obstructive pulmonary disease) (HCC)    Depression    DIABETES MELLITUS, TYPE II 08/21/2006   Diabetic osteomyelitis (HCC) 05/29/2015   Fatty liver    Fracture of 5th metatarsal    non union   GERD 08/21/2006   Had fundoplication   GOUT 08/20/2010   Hammer toe of right foot    2-5th toes   Hx of umbilical hernia repair    HYPERLIPIDEMIA 08/21/2006   HYPERTENSION 08/21/2006   Infection of wound due to methicillin resistant Staphylococcus aureus (MRSA)    Internal hemorrhoids    Kidney problem    Multiple allergies 10/14/2016   OBESITY 06/04/2009   Onychomycosis 10/27/2015   Osteomyelitis of left foot (HCC) 05/29/2015   Osteoporosis    Pulmonary sarcoidosis (HCC)    Followed locally by pulmonology, but also by Dr. Boone Buzzard at [facility]   Right knee pain 01/26/2017   Vocal cord dysfunction    Wears partial dentures    Past Surgical History:  Procedure Laterality Date   ABDOMINAL HYSTERECTOMY     APPENDECTOMY     BLADDER SUSPENSION  11/11/2011    Procedure: TRANSVAGINAL TAPE (TVT) PROCEDURE;  Surgeon: Hamp Levine, MD;  Location: [facility];  Service: Gynecology;  Laterality: N/A;   CAROTIDS  02/18/2011   CAROTID DUPLEX; VERTEBRALS ARE PATENT WITH ANTEGRADE FLOW. ICA/CCA RATIO 1.61 ON RIGHT AND 0.75 ON LEFT   CATARACT EXTRACTION, BILATERAL     CHOLECYSTECTOMY  1984   COLONOSCOPY  04/29/2010   Elvin Hammer   CYSTOSCOPY  11/11/2011   Procedure: CYSTOSCOPY;  Surgeon: Hamp Levine, MD;  Location: [facility];  Service: Gynecology;  Laterality: N/A;   EXTUBATION (ENDOTRACHEAL) IN OR N/A 09/23/2017   Procedure: EXTUBATION (ENDOTRACHEAL) IN OR;  Surgeon: Eldon Greenland, MD;  Location: [facility];  Service: ENT;  Laterality: N/A;   FIBEROPTIC LARYNGOSCOPY AND TRACHEOSCOPY N/A 09/23/2017   Procedure: FLEXIBLE FIBEROPTIC LARYNGOSCOPY;  Surgeon: Eldon Greenland, MD;  Location: [facility];  Service: ENT;  Laterality: N/A;   FRACTURE SURGERY     foot   HALLUX FUSION Left 10/02/2018   Procedure: HALLUX ARTHRODESIS;  Surgeon: Dot Gazella, DPM;  Location: [facility];  Service: Podiatry;  Laterality: Left;   HAMMER TOE SURGERY Left 10/02/2018   Procedure: HAMMER TOE CORRECTION 2ND 3RD 4RD FIFTH TOE;  Surgeon: Dot Gazella, DPM;  Location: [facility];  Service: Podiatry;  Laterality: Left;   HAMMER TOE SURGERY Right 04/12/2019   Procedure: HAMMER TOE CORRECTION, 2ND, 3RD, 4TH AND 5TH TOES OF RIGHT FOOT;  Surgeon: Dot Gazella, DPM;  Location: [facility];  Service: Podiatry;  Laterality: Right;   HAMMER TOE SURGERY Left 10/25/2019   Procedure: HAMMER TOE REPAIR SECOND, THIRD, FOUTH;  Surgeon: Dot Gazella, DPM;  Location: [facility];  Service: Podiatry;  Laterality: Left;   HERNIA REPAIR     I & D EXTREMITY Left 06/27/2015   Procedure: Partial Excision Left Calcaneus, Place Antibiotic Beads, and Wound VAC;  Surgeon: Timothy Ford, MD;  Location: [facility];  Service: Orthopedics;  Laterality: Left;   JOINT REPLACEMENT     KNEE ARTHROSCOPY     right   LEFT AND RIGHT HEART  CATHETERIZATION WITH CORONARY ANGIOGRAM N/A 04/23/2013   Procedure: LEFT AND RIGHT HEART CATHETERIZATION WITH CORONARY ANGIOGRAM;  Surgeon: Arleen Lacer, MD;  Location: [facility] CATH LAB;  Service: Cardiovascular;  Laterality: N/A;   LEFT HEART CATH AND CORONARY ANGIOGRAPHY N/A 03/11/2017   Procedure: Left Heart Cath and Coronary Angiography;  Surgeon: Arnoldo Lapping, MD;  Location: [facility] INVASIVE CV LAB; angiographically minimal CAD in the LAD otherwise normal.;  Normal LVEDP.  FALSE POSITIVE MYOVIEW    LEFT HEART CATH AND CORONARY ANGIOGRAPHY  07/20/2010   LVEF 50-55% WITH VERY MILD GLOBAL HYPOKINESIA; ESSENTIALLY NORMAL CORONARY ARTERIES; NORMAL LV FUNCTION   METATARSAL OSTEOTOMY WITH OPEN REDUCTION INTERNAL FIXATION (ORIF) METATARSAL WITH FUSION Left 04/09/2014   Procedure: LEFT FOOT FRACTURE OPEN TREATMENT METATARSAL INCLUDES INTERNAL FIXATION EACH;  Surgeon: Genevie Kerns, MD;  Location: [facility];  Service: Orthopedics;  Laterality: Left;   NISSEN FUNDOPLICATION  2004   NM MYOVIEW  LTD --> FALSE POSITIVE  03/10/2017   : Moderate size "stress-induced "perfusion defect at the apex as well as "ill-defined stress-induced perfusion defect in the lateral wall.  EF 55%.  INTERMEDIATE risk. -->  FALSE POSITIVE   ORIF DISTAL FEMUR FRACTURE  08/08/2020   [facility]: ORIF of left extra-articular periprosthetic distal femur fracture with synthesis of the femur plate with cortical nonlocking screws.  (Thought to be pathologic fracture due to osteoporosis) -> after a fall   ORIF FEMUR W/ PERI-IMPLANT  08/29/2020   [facility]) revision of left femur fracture ORIF plate fixation screws; culture positive for MSSA   Right and left CARDIAC CATHETERIZATION  04/23/2013   Angiographic normal coronaries; LVEDP 20 mmHg, PCWP 12-14 mmHg, RAP 12 mmHg.; Fick CO/CI 4.9/2.2   ROTATOR CUFF REPAIR Right 12/09/2021   SHOULDER ARTHROSCOPY Left 03/14/2019   Procedure: LEFT SHOULDER  ARTHROSCOPY, DEBRIDEMENT, AND DECOMPRESSION;  Surgeon: Timothy Ford, MD;  Location: [facility];  Service: Orthopedics;  Laterality: Left;   TOTAL KNEE ARTHROPLASTY Right 06/29/2017   Procedure: RIGHT TOTAL KNEE ARTHROPLASTY;  Surgeon: Timothy Ford, MD;  Location: [facility] OR;  Service: Orthopedics;  Laterality: Right;   TOTAL KNEE ARTHROPLASTY Left 12/07/2017   TOTAL KNEE ARTHROPLASTY Left 12/07/2017   Procedure: LEFT TOTAL KNEE ARTHROPLASTY;  Surgeon: Timothy Ford, MD;  Location: [facility] OR;  Service: Orthopedics;  Laterality: Left;   TRACHEOSTOMY TUBE PLACEMENT N/A 09/20/2017   Procedure: AWAKE INTUBATION WITH ANESTHESIA WITH VIDEO ASSISTANCE;  Surgeon: Eldon Greenland, MD;  Location: [facility];  Service: ENT;  Laterality: N/A;   TRANSTHORACIC ECHOCARDIOGRAM  08/2014   Normal LV size and function.  Mild LVH.  EF 55-60%.  Normal regional wall motion.  GR 1 DD.  Normal RV size and function .   TRANSTHORACIC ECHOCARDIOGRAM  08/12/2020    [facility]): Normal LV size and  function.  EF 55 to 60%.  No RWM A.  Normal filling pattern.  Normal RV.  No significant valvular disease-stenosis or regurgitation.  No vegetation.   TUBAL LIGATION     with reversal in 1994   UPPER GASTROINTESTINAL ENDOSCOPY     VENTRAL HERNIA REPAIR      Allergies  Allergies  Allergen Reactions   Firvanq  [Vancomycin ] Other (See Comments)    Dose related nephrotoxicity    Trexall [Methotrexate] Other (See Comments)    Peri-oral and buccal lesions   Zestril  [Lisinopril ] Cough   Chlorhexidine  Itching   Cleocin  [Clindamycin ] Nausea And Vomiting and Rash   Lincocin [Lincomycin Hcl] Nausea And Vomiting and Rash   Teflaro [Ceftaroline] Rash and Other (See Comments)    Tolerates ceftriaxone       Labs/Other Studies Reviewed    The following studies were reviewed today:  Cardiac Studies & Procedures   ______________________________________________________________________________________________      ECHOCARDIOGRAM  ECHOCARDIOGRAM COMPLETE 06/08/2022  Narrative ECHOCARDIOGRAM REPORT    Patient Name:   EBELYN LESNER Date of Exam: 06/08/2022 Medical Rec #:  [id number]        Height:       66.0 in Accession #:    [id number]       Weight:       219.8 lb Date of Birth:  [birth year]       BSA:          2.082 m Patient Age:    60 years         BP:           151/69 mmHg Patient Gender: F                HR:           64 bpm. Exam Location:  Inpatient  Procedure: 2D Echo, Color Doppler and Cardiac Doppler  Indications:    Pulmonary Hypertension  History:        Patient has prior history of Echocardiogram examinations, most recent 02/12/2013. CHF, COPD; Risk Factors:Hypertension, Dyslipidemia and Diabetes.  Sonographer:    Paige Boatman Referring Phys: [id number] MAURICIO DANIEL ARRIEN  IMPRESSIONS   1. Left ventricular ejection fraction, by estimation, is 55 to 60%. The left ventricle has normal function. The left ventricle has no regional wall motion abnormalities. The left ventricular internal cavity size was moderately dilated. Left ventricular diastolic parameters are consistent with Grade I diastolic dysfunction (impaired relaxation). 2. Right ventricular systolic function is normal. The right ventricular size is normal. 3. Left atrial size was mildly dilated. 4. A small pericardial effusion is present. 5. The mitral valve is normal in structure. No evidence of mitral valve regurgitation. No evidence of mitral stenosis. 6. The aortic valve was not well visualized. Aortic valve regurgitation is not visualized. No aortic stenosis is present. 7. The inferior vena cava is normal in size with greater than 50% respiratory variability, suggesting right atrial pressure of 3 mmHg.  Comparison(s): Prior images unable to be directly viewed, comparison made by report only. No significant change from prior study.  FINDINGS Left Ventricle: Left ventricular ejection fraction, by estimation,  is 55 to 60%. The left ventricle has normal function. The left ventricle has no regional wall motion abnormalities. The left ventricular internal cavity size was moderately dilated. There is no left ventricular hypertrophy. Left ventricular diastolic parameters are consistent with Grade I diastolic dysfunction (impaired relaxation).  Right Ventricle: The right ventricular size is normal. No increase in right ventricular wall  thickness. Right ventricular systolic function is normal.  Left Atrium: Left atrial size was mildly dilated.  Right Atrium: Right atrial size was normal in size.  Pericardium: A small pericardial effusion is present.  Mitral Valve: The mitral valve is normal in structure. No evidence of mitral valve regurgitation. No evidence of mitral valve stenosis.  Tricuspid Valve: The tricuspid valve is normal in structure. Tricuspid valve regurgitation is not demonstrated. No evidence of tricuspid stenosis.  Aortic Valve: The aortic valve was not well visualized. Aortic valve regurgitation is not visualized. No aortic stenosis is present. Aortic valve mean gradient measures 4.0 mmHg. Aortic valve peak gradient measures 7.0 mmHg. Aortic valve area, by VTI measures 3.37 cm.  Pulmonic Valve: The pulmonic valve was normal in structure. Pulmonic valve regurgitation is not visualized. No evidence of pulmonic stenosis.  Aorta: The aortic root and ascending aorta are structurally normal, with no evidence of dilitation.  Venous: The inferior vena cava is normal in size with greater than 50% respiratory variability, suggesting right atrial pressure of 3 mmHg.  IAS/Shunts: No atrial level shunt detected by color flow Doppler.   LEFT VENTRICLE PLAX 2D LVIDd:         5.70 cm   Diastology LVIDs:         3.70 cm   LV e' medial:    4.57 cm/s LV PW:         1.00 cm   LV E/e' medial:  18.5 LV IVS:        1.00 cm   LV e' lateral:   6.74 cm/s LVOT diam:     2.20 cm   LV E/e' lateral: 12.6 LV  SV:         103 LV SV Index:   50 LVOT Area:     3.80 cm   RIGHT VENTRICLE RV S prime:     15.40 cm/s  LEFT ATRIUM           Index        RIGHT ATRIUM           Index LA diam:      3.10 cm 1.49 cm/m   RA Area:     17.00 cm LA Vol (A2C): 77.9 ml 37.40 ml/m  RA Volume:   43.80 ml  21.04 ml/m LA Vol (A4C): 85.6 ml 41.12 ml/m AORTIC VALVE AV Area (Vmax):    2.78 cm AV Area (Vmean):   2.81 cm AV Area (VTI):     3.37 cm AV Vmax:           132.00 cm/s AV Vmean:          89.600 cm/s AV VTI:            0.307 m AV Peak Grad:      7.0 mmHg AV Mean Grad:      4.0 mmHg LVOT Vmax:         96.70 cm/s LVOT Vmean:        66.300 cm/s LVOT VTI:          0.272 m LVOT/AV VTI ratio: 0.89  AORTA Ao Root diam: 3.40 cm Ao Asc diam:  3.30 cm  MITRAL VALVE MV Area (PHT): 3.21 cm     SHUNTS MV Decel Time: 236 msec     Systemic VTI:  0.27 m MV E velocity: 84.60 cm/s   Systemic Diam: 2.20 cm MV A velocity: 118.00 cm/s MV E/A ratio:  0.72  Gloriann Larger MD Electronically signed by Arneta Beverage  Paulita Boss MD Signature Date/Time: 06/08/2022/9:11:59 AM    Final          ______________________________________________________________________________________________     Recent Labs: 11/08/2023: TSH 1.81 01/13/2024: ALT 15 02/01/2024: BUN 19; Creatinine, Ser 1.34; Hemoglobin 10.8; Platelets 366; Potassium 4.3; Sodium 140  Recent Lipid Panel    Component Value Date/Time   CHOL 151 10/20/2023 1156   CHOL 162 03/31/2020 1316   TRIG 157 (H) 10/20/2023 1156   TRIG 168 (H) 09/14/2006 1033   HDL 50 10/20/2023 1156   HDL 40 03/31/2020 1316   CHOLHDL 3.0 10/20/2023 1156   VLDL 30.8 10/12/2022 0931   LDLCALC 76 10/20/2023 1156   LDLDIRECT 84.0 07/21/2021 0852    History of Present Illness    63 year old female with the above past medical history including nonobstructive CAD, chronic diastolic heart failure, hypertension, hyperlipidemia, pulmonary sarcoidosis, COPD, type 2 diabetes,  varicose veins, fatty liver disease, vocal cord dysfunction, and GERD.  She has a history of chronic diastolic heart failure.  Echocardiogram in 2012 and again in 2015 showed normal LV function, G1 DD.  She has had intermittent chest discomfort over the years and has undergone multiple stress tests.  Cardiac catheterization in 2018 showed minimal nonobstructive irregularities of the LAD, otherwise normal coronaries, LVEDP was normal.  Cardiac monitor in 2019 in the setting of palpitations showed predominantly sinus rhythm, occasional PVCs, no significant arrhythmia.  Echocardiogram in 05/2022 during hospitalization for COPD exacerbation showed EF 55 to 60%, normal wall motion, G1 DD.  She was hospitalized in 11/2022 in the setting of acute hypoxic respiratory failure secondary to COPD exacerbation and influenza pneumonia.  She was last seen in office on 02/17/2023 and was stable from a cardiac standpoint.  She reported stable chronic dyspnea, she denied chest pain.  She presents today for follow-up accompanied by her sister-in-law and for preoperative cardiac evaluation for upcoming hernia surgery with Dr. Shela Derby of [facility] surgery with request to hold aspirin  prior to procedure. Since her last visit she has done well from a cardiac standpoint.  She denies any symptoms concerning for angina, denies worsening dyspnea.  She is very active, she goes to the gym regularly.  Overall, she reports feeling well.  Home Medications    Current Outpatient Medications  Medication Sig Dispense Refill   albuterol  (PROVENTIL ) (2.5 MG/3ML) 0.083% nebulizer solution Take 3 mLs (2.5 mg total) by nebulization every 6 (six) hours as needed for wheezing or shortness of breath (Use 2-3 times per day when sick.). 75 mL 2   albuterol  (VENTOLIN  HFA) 108 (90 Base) MCG/ACT inhaler Inhale 1-2 puffs into the lungs every 4 (four) hours as needed for wheezing or shortness of breath. 8.5 each 1   aspirin  EC 81 MG tablet  Take 1 tablet (81 mg total) by mouth daily. 30 tablet 11   atorvastatin  (LIPITOR) 40 MG tablet Take 1 tablet (40 mg total) by mouth daily. 90 tablet 3   benzonatate  (TESSALON  PERLES) 100 MG capsule Take 1-2 capsules (100-200 mg total) by mouth 3 (three) times daily as needed for cough. 30 capsule 3   BREO ELLIPTA  200-25 MCG/INH AEPB TAKE 1 PUFF BY MOUTH EVERY DAY (Patient taking differently: Inhale 1 puff into the lungs daily.) 60 each 5   buPROPion  (WELLBUTRIN  XL) 300 MG 24 hr tablet TAKE 1 TABLET BY MOUTH EVERY DAY 90 tablet 3   chlorpheniramine -HYDROcodone  (TUSSIONEX) 10-8 MG/5ML Take 5 mLs by mouth at bedtime as needed for cough (do not drive for 8 hours  after taking). 115 mL 0   Continuous Glucose Sensor (DEXCOM G7 SENSOR) MISC 1 DEVICE BY DOES NOT APPLY ROUTE AS DIRECTED. CHANGE SENSOR EVERY 10 DAYS 3 each 3   cyclobenzaprine  (FLEXERIL ) 5 MG tablet Take 1 tablet (5 mg total) by mouth 3 (three) times daily as needed for muscle spasms (or Headache). 30 tablet 0   diltiazem  (CARDIZEM  CD) 120 MG 24 hr capsule Take 1 capsule (120 mg total) by mouth daily. 90 capsule 3   estradiol  (ESTRACE ) 0.1 MG/GM vaginal cream Place 0.5 Applicatorfuls vaginally 2 (two) times a week.     famotidine  (PEPCID ) 20 MG tablet TAKE 1 TABLET BY MOUTH TWICE A DAY 180 tablet 1   fenofibrate  160 MG tablet TAKE 1 TABLET BY MOUTH EVERY DAY 90 tablet 1   fluticasone  (FLONASE ) 50 MCG/ACT nasal spray Place 2 sprays into both nostrils daily as needed for allergies. 48 mL 3   furosemide  (LASIX ) 40 MG tablet Take 1 tablet (40 mg total) by mouth daily. 90 tablet 3   gabapentin  (NEURONTIN ) 100 MG capsule TAKE 1 CAPSULE BY MOUTH THREE TIMES A DAY 270 capsule 3   glucose blood (CONTOUR NEXT TEST) test strip 1 each by Other route 4 (four) times daily. And lancets 4/day 400 each 3   guaiFENesin -dextromethorphan  (ROBITUSSIN DM) 100-10 MG/5ML syrup Take 5 mLs by mouth every 4 (four) hours as needed for cough (chest congestion). 118 mL 0    insulin  aspart (NOVOLOG  FLEXPEN) 100 UNIT/ML FlexPen Inject 15 Units into the skin daily with supper. 15 mL 11   insulin  glargine-yfgn (SEMGLEE , YFGN,) 100 UNIT/ML Pen Inject 16 Units into the skin every morning. (Patient taking differently: Inject 23 Units into the skin every morning.) 30 mL 2   Insulin  Pen Needle (PEN NEEDLES) 32G X 4 MM MISC 1 each by Does not apply route 4 (four) times daily. E11.9 400 each 0   ipratropium (ATROVENT ) 0.06 % nasal spray PLACE 2 SPRAYS INTO BOTH NOSTRILS 4 TIMES DAILY. 45 mL 3   KLOR-CON  M20 20 MEQ tablet TAKE 1 & 1/2 TABLETS BY MOUTH TWICE A DAY 270 tablet 2   Lancet Devices (EASY MINI EJECT LANCING DEVICE) MISC Use as directed 4 times daily to test blood sugar 1 each 3   LANTUS  SOLOSTAR 100 UNIT/ML Solostar Pen INJECT 16 UNITS INTO THE SKIN EVERY MORNING.     metFORMIN  (GLUCOPHAGE -XR) 500 MG 24 hr tablet TAKE 4 TABLETS BY MOUTH DAILY WITH BREAKFAST. 360 tablet 0   Microlet Lancets MISC 1 each by Does not apply route 2 (two) times daily. E11.9 100 each 2   ondansetron  (ZOFRAN -ODT) 4 MG disintegrating tablet Take 1 tablet (4 mg total) by mouth every 8 (eight) hours as needed for nausea or vomiting. 20 tablet 0   pantoprazole  (PROTONIX ) 40 MG tablet TAKE 1 TABLET BY MOUTH TWICE A DAY 180 tablet 1   telmisartan  (MICARDIS ) 40 MG tablet TAKE 1 TABLET BY MOUTH EVERY DAY 90 tablet 3   tirzepatide (MOUNJARO) 7.5 MG/0.5ML Pen Inject 7.5 mg into the skin once a week. 6 mL 0   venlafaxine  XR (EFFEXOR -XR) 75 MG 24 hr capsule TAKE 1 CAPSULE BY MOUTH TWICE A DAY 180 capsule 3   No current facility-administered medications for this visit.     Review of Systems    She denies chest pain, palpitations, pnd, orthopnea, n, v, dizziness, syncope, edema, weight gain, or early satiety. All other systems reviewed and are otherwise negative except as noted above.  Physical Exam    VS:  BP (!) 140/68 (BP Location: Left Arm, Patient Position: Sitting, Cuff Size: Normal)   Pulse 77    Ht 5\' 6"  (1.676 m)   Wt 202 lb (91.6 kg)   SpO2 96%   BMI 32.60 kg/m  GEN: Well nourished, well developed, in no acute distress. HEENT: normal. Neck: Supple, no JVD, carotid bruits, or masses. Cardiac: RRR, no murmurs, rubs, or gallops. No clubbing, cyanosis, edema.  Radials/DP/PT 2+ and equal bilaterally.  Respiratory:  Respirations regular and unlabored, clear to auscultation bilaterally. GI: Soft, nontender, nondistended, BS + x 4. MS: no deformity or atrophy. Skin: warm and dry, no rash. Neuro:  Strength and sensation are intact. Psych: Normal affect.  Accessory Clinical Findings    ECG personally reviewed by me today - EKG Interpretation Date/Time:  Thursday Feb 23 2024 08:19:48 EDT Ventricular Rate:  77 PR Interval:  172 QRS Duration:  94 QT Interval:  364 QTC Calculation: 411 R Axis:   4  Text Interpretation: Normal sinus rhythm Cannot rule out Inferior infarct , age undetermined When compared with ECG of 09-Aug-2023 08:52, PREVIOUS ECG IS PRESENT Confirmed by Marlana Silvan ([id number]) on 02/23/2024 8:47:41 AM  - no acute changes.   Lab Results  Component Value Date   WBC 12.1 (H) 02/01/2024   HGB 10.8 (L) 02/01/2024   HCT 32.8 (L) 02/01/2024   MCV 89.9 02/01/2024   PLT 366 02/01/2024   Lab Results  Component Value Date   CREATININE 1.34 (H) 02/01/2024   BUN 19 02/01/2024   NA 140 02/01/2024   K 4.3 02/01/2024   CL 103 02/01/2024   CO2 21 (L) 02/01/2024   Lab Results  Component Value Date   ALT 15 01/13/2024   AST 19 01/13/2024   ALKPHOS 58 01/13/2024   BILITOT 0.5 01/13/2024   Lab Results  Component Value Date   CHOL 151 10/20/2023   HDL 50 10/20/2023   LDLCALC 76 10/20/2023   LDLDIRECT 84.0 07/21/2021   TRIG 157 (H) 10/20/2023   CHOLHDL 3.0 10/20/2023    Lab Results  Component Value Date   HGBA1C 6.9 (A) 01/18/2024    Assessment & Plan    1. CAD: Cardiac catheterization in 2018 showed minimal nonobstructive irregularities of the LAD,  otherwise normal coronaries. Stable with no anginal symptoms. No indication for ischemic evaluation.  Continue aspirin , telmisartan , diltiazem , Lipitor, and fenofibrate .  2. Chronic diastolic heart failure: Most recent echo in 05/2022 showed EF 55 to 60%, normal wall motion, G1 DD.  She has stable mild dyspnea in the setting of sarcoidosis/COPD.  Euvolemic and well compensated on exam.  Continue Lasix .  3. Hypertension: BP slightly elevated in office today, has been well-controlled at home with SBP ranging from 120 to 130 mmHg.  Continue to monitor BP and report BP consistently greater than 130/80.  For now, continue current antihypertensive regimen.  4. Hyperlipidemia: LDL was 76 in 10/2023.  Continue Lipitor, fenofibrate .  5. COPD/sarcoidosis: Denies worsening dyspnea.  Follows with pulmonology.  6. Type 2 diabetes: A1c was 6.9 in 01/2024.  Follows with endocrinology.  7. Preoperative cardiac exam: According to the Revised Cardiac Risk Index (RCRI), her Perioperative Risk of Major Cardiac Event is (%): 0.9. Her Functional Capacity in METs is: 7.01 according to the [employer] Activity Status Index (DASI). Therefore, based on ACC/AHA guidelines, patient would be at acceptable risk for the planned procedure without further cardiovascular testing. Per office protocol, she may hold Aspirin  for 5-7 days  prior to procedure. Please resume Aspirin  as soon as possible postprocedure, at the discretion of the surgeon. I will route this recommendation to the requesting party via Epic fax function.    8. Disposition: Follow-up in 1 year, sooner if needed.   Jude Norton, NP 02/23/2024, 8:47 AM

## 2024-02-23 NOTE — Patient Instructions (Signed)
 Medication Instructions:  Your physician recommends that you continue on your current medications as directed. Please refer to the Current Medication list given to you today.  *If you need a refill on your cardiac medications before your next appointment, please call your pharmacy*  Lab Work: NONE ordered at this time of appointment   Testing/Procedures: NONE ordered at this time of appointment   Follow-Up: At [facility], you and your health needs are our priority.  As part of our continuing mission to provide you with exceptional heart care, our providers are all part of one team.  This team includes your primary Cardiologist (physician) and Advanced Practice Providers or APPs (Physician Assistants and Nurse Practitioners) who all work together to provide you with the care you need, when you need it.  Your next appointment:   1 year(s)  Provider:   Randene Bustard, MD    We recommend signing up for the patient portal called "MyChart".  Sign up information is provided on this After Visit Summary.  MyChart is used to connect with patients for Virtual Visits (Telemedicine).  Patients are able to view lab/test results, encounter notes, upcoming appointments, etc.  Non-urgent messages can be sent to your provider as well.   To learn more about what you can do with MyChart, go to [email or web address].

## 2024-03-12 ENCOUNTER — Ambulatory Visit: Payer: 59 | Admitting: Family Medicine

## 2024-03-13 ENCOUNTER — Ambulatory Visit: Admitting: Cardiology

## 2024-03-13 NOTE — Pre-Procedure Instructions (Signed)
 Surgical Instructions   Your procedure is scheduled on March 26, 2024. Report to [facility]tr[facility] "A" at 12:15 P.M., then check in with the Admitting office. Any questions or running late day of surgery: call [phone]  Questions prior to your surgery date: call [phone], Monday-Friday, 8am-4pm. If you experience any cold or flu symptoms such as cough, fever, chills, shortness of breath, etc. between now and your scheduled surgery, please notify us  at the above number.     Remember:  Do not eat after midnight the night before your surgery   You may drink clear liquids until 11:15 AM the morning of your surgery.   Clear liquids allowed are: Water , Non-Citrus Juices (without pulp), Carbonated Beverages, Clear Tea (no milk, honey, etc.), Black Coffee Only (NO MILK, CREAM OR POWDERED CREAMER of any kind), and Gatorade.    Take these medicines the morning of surgery with A SIP OF WATER : atorvastatin  (LIPITOR)  BREO ELLIPTA  Inhaler buPROPion  (WELLBUTRIN  XL)  diltiazem  (CARDIZEM  CD)  famotidine  (PEPCID )  fenofibrate   gabapentin  (NEURONTIN )  ipratropium (ATROVENT ) nasal spray  pantoprazole  (PROTONIX )  venlafaxine  XR (EFFEXOR -XR)    May take these medicines IF NEEDED: albuterol  (PROVENTIL ) nebulizer solution  albuterol  (VENTOLIN  HFA) inhaler - please bring inhaler with you day of surgery fluticasone  (FLONASE ) nasal spray  ondansetron  (ZOFRAN -ODT)    Follow your surgeon's instructions on when to stop Aspirin .  If no instructions were given by your surgeon then you will need to call the office to get those instructions.     One week prior to surgery, STOP taking any Aleve , Naproxen , Ibuprofen , Motrin , Advil , Goody's, BC's, all herbal medications, fish oil, and non-prescription vitamins.   WHAT DO I DO ABOUT MY DIABETES MEDICATION?   Do not take metFORMIN  (GLUCOPHAGE -XR) the morning of surgery.  THE NIGHT BEFORE SURGERY/THE MORNING OF SURGERY, take 12 units of  LANTUS  insulin .   STOP taking your tirzepatide [facility]) one week prior to surgery. DO NOT take any doses after June 8th.   DO NOT take any of your insulin  aspart (NOVOLOG  FLEXPEN) the night/bedtime prior to surgery.   Morning of surgery, if your CBG is greater than 220 mg/dL, you may take  of your insulin  aspart (NOVOLOG  FLEXPEN).   HOW TO MANAGE YOUR DIABETES BEFORE AND AFTER SURGERY  Why is it important to control my blood sugar before and after surgery? Improving blood sugar levels before and after surgery helps healing and can limit problems. A way of improving blood sugar control is eating a healthy diet by:  Eating less sugar and carbohydrates  Increasing activity/exercise  Talking with your doctor about reaching your blood sugar goals High blood sugars (greater than 180 mg/dL) can raise your risk of infections and slow your recovery, so you will need to focus on controlling your diabetes during the weeks before surgery. Make sure that the doctor who takes care of your diabetes knows about your planned surgery including the date and location.  How do I manage my blood sugar before surgery? Check your blood sugar at least 4 times a day, starting 2 days before surgery, to make sure that the level is not too high or low.  Check your blood sugar the morning of your surgery when you wake up and every 2 hours until you get to the Short Stay unit.  If your blood sugar is less than 70 mg/dL, you will need to treat for low blood sugar: Do not take insulin . Treat a low blood sugar (less than  70 mg/dL) with  cup of clear juice (cranberry or apple), 4 glucose tablets, OR glucose gel. Recheck blood sugar in 15 minutes after treatment (to make sure it is greater than 70 mg/dL). If your blood sugar is not greater than 70 mg/dL on recheck, call [phone] for further instructions. Report your blood sugar to the short stay nurse when you get to Short Stay.  If you are admitted to the  hospital after surgery: Your blood sugar will be checked by the staff and you will probably be given insulin  after surgery (instead of oral diabetes medicines) to make sure you have good blood sugar levels. The goal for blood sugar control after surgery is 80-180 mg/dL.                      Do NOT Smoke (Tobacco/Vaping) for 24 hours prior to your procedure.  If you use a CPAP at night, you may bring your mask/headgear for your overnight stay.   You will be asked to remove any contacts, glasses, piercing's, hearing aid's, dentures/partials prior to surgery. Please bring cases for these items if needed.    Patients discharged the day of surgery will not be allowed to drive home, and someone needs to stay with them for 24 hours.  SURGICAL WAITING ROOM VISITATION Patients may have no more than 2 support people in the waiting area - these visitors may rotate.   Pre-op nurse will coordinate an appropriate time for 1 ADULT support person, who may not rotate, to accompany patient in pre-op.  Children under the age of 70 must have an adult with them who is not the patient and must remain in the main waiting area with an adult.  If the patient needs to stay at the hospital during part of their recovery, the visitor guidelines for inpatient rooms apply.  Please refer to the [facility] website for the visitor guidelines for any additional information.   If you received a COVID test during your pre-op visit  it is requested that you wear a mask when out in public, stay away from anyone that may not be feeling well and notify your surgeon if you develop symptoms. If you have been in contact with anyone that has tested positive in the last 10 days please notify you surgeon.      Pre-operative CHG Bathing Instructions   You can play a key role in reducing the risk of infection after surgery. Your skin needs to be as free of germs as possible. You can reduce the number of germs on your skin by washing  with CHG (chlorhexidine  gluconate) soap before surgery. CHG is an antiseptic soap that kills germs and continues to kill germs even after washing.   DO NOT use if you have an allergy to chlorhexidine /CHG or antibacterial soaps. If your skin becomes reddened or irritated, stop using the CHG and notify one of our RNs at [phone].              TAKE A SHOWER THE NIGHT BEFORE SURGERY AND THE DAY OF SURGERY    Please keep in mind the following:  DO NOT shave, including legs and underarms, 48 hours prior to surgery.   You may shave your face before/day of surgery.  Place clean sheets on your bed the night before surgery Use a clean washcloth (not used since being washed) for each shower. DO NOT sleep with pet's night before surgery.  CHG Shower Instructions:  Wash your face  and private area with normal soap. If you choose to wash your hair, wash first with your normal shampoo.  After you use shampoo/soap, rinse your hair and body thoroughly to remove shampoo/soap residue.  Turn the water  OFF and apply half the bottle of CHG soap to a CLEAN washcloth.  Apply CHG soap ONLY FROM YOUR NECK DOWN TO YOUR TOES (washing for 3-5 minutes)  DO NOT use CHG soap on face, private areas, open wounds, or sores.  Pay special attention to the area where your surgery is being performed.  If you are having back surgery, having someone wash your back for you may be helpful. Wait 2 minutes after CHG soap is applied, then you may rinse off the CHG soap.  Pat dry with a clean towel  Put on clean pajamas    Additional instructions for the day of surgery: DO NOT APPLY any lotions, deodorants, cologne, or perfumes.   Do not wear jewelry or makeup Do not wear nail polish, gel polish, artificial nails, or any other type of covering on natural nails (fingers and toes) Do not bring valuables to the hospital. The [facility] is not responsible for valuables/personal belongings. Put on clean/comfortable clothes.  Please  brush your teeth.  Ask your nurse before applying any prescription medications to the skin.

## 2024-03-14 ENCOUNTER — Encounter (HOSPITAL_COMMUNITY): Payer: Self-pay

## 2024-03-14 ENCOUNTER — Other Ambulatory Visit: Payer: Self-pay

## 2024-03-14 ENCOUNTER — Encounter (HOSPITAL_COMMUNITY)
Admission: RE | Admit: 2024-03-14 | Discharge: 2024-03-14 | Disposition: A | Discharge status: Home / Self Care | Source: Ambulatory Visit | Attending: General Surgery | Admitting: General Surgery

## 2024-03-14 VITALS — BP 134/62 | HR 80 | Temp 98.2°F | Resp 17 | Ht 66.0 in | Wt 200.4 lb

## 2024-03-14 DIAGNOSIS — K760 Fatty (change of) liver, not elsewhere classified: Secondary | ICD-10-CM | POA: Insufficient documentation

## 2024-03-14 DIAGNOSIS — Z01818 Encounter for other preprocedural examination: Secondary | ICD-10-CM

## 2024-03-14 DIAGNOSIS — Z9889 Other specified postprocedural states: Secondary | ICD-10-CM | POA: Diagnosis not present

## 2024-03-14 DIAGNOSIS — N1831 Chronic kidney disease, stage 3a: Secondary | ICD-10-CM | POA: Diagnosis not present

## 2024-03-14 DIAGNOSIS — D649 Anemia, unspecified: Secondary | ICD-10-CM | POA: Insufficient documentation

## 2024-03-14 DIAGNOSIS — Z7982 Long term (current) use of aspirin: Secondary | ICD-10-CM | POA: Diagnosis not present

## 2024-03-14 DIAGNOSIS — E1122 Type 2 diabetes mellitus with diabetic chronic kidney disease: Secondary | ICD-10-CM | POA: Insufficient documentation

## 2024-03-14 DIAGNOSIS — Z96653 Presence of artificial knee joint, bilateral: Secondary | ICD-10-CM | POA: Insufficient documentation

## 2024-03-14 DIAGNOSIS — I5032 Chronic diastolic (congestive) heart failure: Secondary | ICD-10-CM | POA: Diagnosis not present

## 2024-03-14 DIAGNOSIS — Z79899 Other long term (current) drug therapy: Secondary | ICD-10-CM | POA: Diagnosis not present

## 2024-03-14 DIAGNOSIS — D860 Sarcoidosis of lung: Secondary | ICD-10-CM | POA: Insufficient documentation

## 2024-03-14 DIAGNOSIS — Z01812 Encounter for preprocedural laboratory examination: Secondary | ICD-10-CM | POA: Diagnosis not present

## 2024-03-14 DIAGNOSIS — I130 Hypertensive heart and chronic kidney disease with heart failure and stage 1 through stage 4 chronic kidney disease, or unspecified chronic kidney disease: Secondary | ICD-10-CM | POA: Diagnosis not present

## 2024-03-14 DIAGNOSIS — K219 Gastro-esophageal reflux disease without esophagitis: Secondary | ICD-10-CM | POA: Insufficient documentation

## 2024-03-14 DIAGNOSIS — Z794 Long term (current) use of insulin: Secondary | ICD-10-CM | POA: Diagnosis not present

## 2024-03-14 DIAGNOSIS — J449 Chronic obstructive pulmonary disease, unspecified: Secondary | ICD-10-CM | POA: Diagnosis not present

## 2024-03-14 DIAGNOSIS — E785 Hyperlipidemia, unspecified: Secondary | ICD-10-CM | POA: Diagnosis not present

## 2024-03-14 DIAGNOSIS — K432 Incisional hernia without obstruction or gangrene: Secondary | ICD-10-CM | POA: Insufficient documentation

## 2024-03-14 LAB — CBC
HCT: 34 % — ABNORMAL LOW (ref 36.0–46.0)
Hemoglobin: 10.9 g/dL — ABNORMAL LOW (ref 12.0–15.0)
MCH: 29.3 pg (ref 26.0–34.0)
MCHC: 32.1 g/dL (ref 30.0–36.0)
MCV: 91.4 fL (ref 80.0–100.0)
Platelets: 374 10*3/uL (ref 150–400)
RBC: 3.72 MIL/uL — ABNORMAL LOW (ref 3.87–5.11)
RDW: 13 % (ref 11.5–15.5)
WBC: 10.9 10*3/uL — ABNORMAL HIGH (ref 4.0–10.5)
nRBC: 0 % (ref 0.0–0.2)

## 2024-03-14 LAB — COMPREHENSIVE METABOLIC PANEL WITH GFR
ALT: 28 U/L (ref 0–44)
AST: 32 U/L (ref 15–41)
Albumin: 3.9 g/dL (ref 3.5–5.0)
Alkaline Phosphatase: 56 U/L (ref 38–126)
Anion gap: 7 (ref 5–15)
BUN: 22 mg/dL (ref 8–23)
CO2: 27 mmol/L (ref 22–32)
Calcium: 9.4 mg/dL (ref 8.9–10.3)
Chloride: 103 mmol/L (ref 98–111)
Creatinine, Ser: 1.63 mg/dL — ABNORMAL HIGH (ref 0.44–1.00)
GFR, Estimated: 35 mL/min — ABNORMAL LOW (ref >60)
Glucose, Bld: 147 mg/dL — ABNORMAL HIGH (ref 70–99)
Potassium: 4.7 mmol/L (ref 3.5–5.1)
Sodium: 137 mmol/L (ref 135–145)
Total Bilirubin: 0.9 mg/dL (ref 0.0–1.2)
Total Protein: 6.9 g/dL (ref 6.5–8.1)

## 2024-03-14 LAB — HEMOGLOBIN A1C
Hgb A1c MFr Bld: 6.4 % — ABNORMAL HIGH (ref 4.8–5.6)
Mean Plasma Glucose: 136.98 mg/dL

## 2024-03-14 LAB — GLUCOSE, CAPILLARY: Glucose-Capillary: 136 mg/dL — ABNORMAL HIGH (ref 70–99)

## 2024-03-14 NOTE — Progress Notes (Signed)
 PCP - Dr. Clarisa Crooked Cardiologist - Dr. Randene Bustard LOV 02-23-24 w/follow up in 1 year  PPM/ICD - Denies Device Orders - N/A Rep Notified - N/A  Chest x-ray - N/A EKG - 02-23-24 Stress Test - 03-10-17 ECHO - 06-08-22 Cardiac Cath - 03-11-17  Sleep Study - Denies CPAP - n/a  Fasting Blood Sugar - 130'3 Checks Blood Sugar __3___ times a day; wears a Dexcom on Left arm  Last dose of GLP1 agonist-  Mounjaro GLP1 instructions: Last dose to be taken on her normal day Saturday, June 7th  Blood Thinner Instructions: Denies Aspirin  Instructions: Hold for 5 days; last dose on 03/16/24  ERAS Protcol - Clears 1115 PRE-SURGERY Ensure or G2- none   COVID TEST- N/A   Anesthesia review: Yes, cardiac clearance, HTN, CHF, COPD, DM, fatty liver.   Patient denies shortness of breath, fever, cough and chest pain at PAT appointment. Patient denies any respiratory issues at this time.    All instructions explained to the patient, with a verbal understanding of the material. Patient agrees to go over the instructions while at home for a better understanding. Patient also instructed to self quarantine after being tested for COVID-19. The opportunity to ask questions was provided.

## 2024-03-15 NOTE — Progress Notes (Addendum)
 Anesthesia Chart Review:  Case: [id number] Date/Time: 03/26/24 1400   Procedures:      LAPAROSCOPY, DIAGNOSTIC     LYSIS, ADHESIONS, LAPAROSCOPIC     REPAIR, HERNIA, VENTRAL, LAPAROSCOPY-ASSISTED - LAP VENTRAL HERNIA REPAIR WITH MESH   Anesthesia type: General   Diagnosis: Incisional hernia, without obstruction or gangrene [K43.2]   Pre-op diagnosis: Incisional hernia, without obstruction or gangrene   [facility]   Surgeons: Shela Derby, MD       DISCUSSION: Patient is a 63 year old female scheduled for the above procedure.  History includes never smoker, chronic diastolic CHF, HTN, DM2, HLD, COPD, pulmonary sarcoidosis, vocal cord dysfunction, anemia, fatty liver, GERD (s/p fundoplication), epiglottitis (09/2017 requiring awake fiberoptic intubation through right nare with 6.5 ETT, extubated 09/23/17), osteoarthritis (right TKA 06/29/17; left TKA 2/27/'19). BMI is consistent obesity.   She had cardiology evaluation on 02/23/24 by Marlana Silvan, NP.  Followed for chronic diastolic CHF, . Minimal LAD disease by 2018 [facility]. She denied angina or worsening dyspnea. Reported being active and goes to the gym regularly. Weight was down about 10 lbs since May 2024 visit. Euvolemic, on Lasix . No ischemic testing recommended. Preoperative cardiac exam: According to the Revised Cardiac Risk Index (RCRI), her Perioperative Risk of Major Cardiac Event is (%): 0.9. Her Functional Capacity in METs is: 7.01 according to the Duke Activity Status Index (DASI). Therefore, based on ACC/AHA guidelines, patient would be at acceptable risk for the planned procedure without further cardiovascular testing. Per office protocol, she may hold Aspirin  for 5-7 days prior to procedure. Please resume Aspirin  as soon as possible postprocedure, at the discretion of the surgeon. One year follow-up recommended.   Last pulmonology visit was on 08/23/23 with Dr. Waylan Haggard. She was diagnosed with sarcoidosis in 2008  per mediastinoscopy. Off steroids since 2021. Recent CT showed no significant pulmonary involvement. Hospitalized in 07/2023 for COPD/asthma exacerbation in setting of rhinovirus. Treated with IV steroids, antibiotics, and bronchodilators. Continue Breo daily and albuterol  2-3 x/day as needed. Dilated main pulmonary artery on 06/05/22 CTA chest. 05/2822 TTE showed LVEF 55-60%, grade 1 diastolic dysfunction, normal RV size and function, small pericardial effusion, no TR. She had mild OSA by 10/2022 sleep study. She wanted to try weight loss before considering CPAP. Follow-up ~ 6 months planned, scheduled for APP visit 05/25/24.     A1c 6.4% on 03/14/24. She has a Dexcom G7 CGM. She is on metformin , Mounjaro , Lantus  24 units/day, as needed Novolog . Last Mounjaro  planned 03/17/24. Creatinine was 1.63, up from 1.34 on 02/01/24 but has been trending upward since October. I did forward labs to PCP Dr. Arlene Ben for future follow-up purposes. Continue repeat Creatinine on the day of surgery versus post-operative follow-up. (UPDATE 03/16/24 6:29 PM: PCP Dr. Arlene Ben does want BMP repeated on day of surgery, and requests I forward him results for future follow-up/management.  Day of surgery BMP ordered.)  ADDENDUM 03/26/24 9:15 AM: Noted that after PAT, she had primary care visit on 03/20/24 for abdominal pain and N/D with urinary frequency and dysuria. CT abd/pelvis showed no acute findings, mild splenomegaly, similar fat containing hernia, aorthic atherosclerosis. CMP showed and worsening renal function with Creatinine up to 1.77, normal LFTs. CBC showed elevated WBC at 14K with elevated absolute neutrophils. Urine culture positive for Klebsiella pneumoniae. She was prescribed Augmentin . Metformin  was placed on hold. Appears she did communicate with surgery. As requested. She had a BMP today prior to surgery. Her Creatinine has improved a  bit since 03/20/24 with Cr 1.16, BUN 17, eGFR 53. Will forward to Dr. Arlene Ben. She underwent surgery  earlier this morning.    VS: BP 134/62   Pulse 80   Temp 36.8 C   Resp 17   Ht 5' 6 (1.676 m)   Wt 90.9 kg   SpO2 100%   BMI 32.35 kg/m   PROVIDERS: Almira Jaeger, MD is PCP, last visit 02/09/24 and noted plans for surgery.  Randene Bustard, MD is cardiologist Mannam, Praveen, MD is pulmonologist Jorge Newcomer, MD is endocrinologist Almeda Jacobs, MD is HEM, last visit 02/12/22 for multi-factorial anemia, IDA and B12 deficiency. Ok to continue follow-up with PCP and endocrinology.Aaron Aas  LABS: Preoperative labs noted. H/H 10.9/34.0, consistent with recent prior results. Cr 1.63, up fro m1.34 on 02/01/24.  (all labs ordered are listed, but only abnormal results are displayed)  Labs Reviewed  GLUCOSE, CAPILLARY - Abnormal; Notable for the following components:      Result Value   Glucose-Capillary 136 (*)    All other components within normal limits  CBC - Abnormal; Notable for the following components:   WBC 10.9 (*)    RBC 3.72 (*)    Hemoglobin 10.9 (*)    HCT 34.0 (*)    All other components within normal limits  COMPREHENSIVE METABOLIC PANEL WITH GFR - Abnormal; Notable for the following components:   Glucose, Bld 147 (*)    Creatinine, Ser 1.63 (*)    GFR, Estimated 35 (*)    All other components within normal limits  HEMOGLOBIN A1C - Abnormal; Notable for the following components:   Hgb A1c MFr Bld 6.4 (*)    All other components within normal limits    OTHER: Home Sleep Study 10/14/22: Mildi OSA/hypopnea syndrome, AHI 8.5/Hr. Snoring with oxygen  desaturation to a nadir of 80% with average 94%.  PFTs 10/14/22: FVC 2.61 [75%], FEV1 2.26 [84%], F/F86, TLC 5.03 [93%], DLCO 20.21 [94%] Air trapping, bronchodilator response     IMAGES: CT Renal Stone Study 02/01/24: IMPRESSION: 1. No acute localizing process in the abdomen or pelvis. 2. Hepatosplenomegaly. 3. Heterogeneous appearance of the liver, nonspecific. 4. Small hernia in the anterior right upper quadrant  containing fat and nondilated colon. Correlate clinically for point tenderness. 5. Aortic atherosclerosis.   1V CXR 08/09/23: IMPRESSION: 1. Low lung volumes with bronchovascular crowding. Hazy bilateral lower lung opacities, which may represent atelectasis, aspiration, or pneumonia. 2. Cardiomegaly.    EKG:  EKG 02/23/24: Normal sinus rhythm Cannot rule out Inferior infarct , age undetermined When compared with ECG of 09-Aug-2023 08:52, PREVIOUS ECG IS PRESENT Confirmed by Marlana Silvan ([id number]) on 02/23/2024 8:47:41 AM     CV: Echo 06/08/22: IMPRESSIONS   1. Left ventricular ejection fraction, by estimation, is 55 to 60%. The  left ventricle has normal function. The left ventricle has no regional  wall motion abnormalities. The left ventricular internal cavity size was  moderately dilated. Left ventricular  diastolic parameters are consistent with Grade I diastolic dysfunction  (impaired relaxation).   2. Right ventricular systolic function is normal. The right ventricular  size is normal.   3. Left atrial size was mildly dilated.   4. A small pericardial effusion is present.   5. The mitral valve is normal in structure. No evidence of mitral valve  regurgitation. No evidence of mitral stenosis.   6. The aortic valve was not well visualized. Aortic valve regurgitation  is not visualized. No aortic stenosis is present.   7.  The inferior vena cava is normal in size with greater than 50%  respiratory variability, suggesting right atrial pressure of 3 mmHg.  - Comparison(s): Prior images unable to be directly viewed, comparison made  by report only. No significant change from prior study.    Cardiac Event Monitor 07/20/18-08/18/18:  Mostly sinus rhythm: Rates 39 to 220 bpm. Average 79 bpm.   Roughly 2% PVCs and less than 1% PACs.   No arrhythmias: No A. fib, PAT, MAT, SVT or VT.    Relatively normal study with occasional PVCs.     Cardiac cath 03/11/17 (done for  intermediate risk stress test):  Conclusion: 1. Angiographically normal left main, RCA, and LCx 2. Minimal nonobstructive irregularity in the LAD 3. Normal LVEDP    Nuclear stress test 03/10/17:  Impression: 1. There is a moderate-sized stress-induced perfusion defect at the apex as well as ill-defined stress-induced perfusion defect in the lateral. 2. Normal left ventricular wall motion. 3. Left ventricular ejection fraction 55% 4. Non invasive risk stratification: Intermediate    Past Medical History:  Diagnosis Date   Abnormal SPEP 04/17/2014   Acute left ankle pain 01/26/2017   Allergy    ANEMIA-UNSPECIFIED 09/18/2009   Anxiety    Arthritis    Blood transfusion without reported diagnosis    Cataract    CHF (congestive heart failure) (HCC)    Chronic diastolic heart failure, NYHA class 2 (HCC)    Normal LVEDP by May 2018   COPD (chronic obstructive pulmonary disease) (HCC)    Depression    DIABETES MELLITUS, TYPE II 08/21/2006   Diabetic osteomyelitis (HCC) 05/29/2015   Fatty liver    Fracture of 5th metatarsal    non union   GERD 08/21/2006   Had fundoplication   GOUT 08/20/2010   Hammer toe of right foot    2-5th toes   Hx of umbilical hernia repair    HYPERLIPIDEMIA 08/21/2006   HYPERTENSION 08/21/2006   Infection of wound due to methicillin resistant Staphylococcus aureus (MRSA)    Internal hemorrhoids    Kidney problem    Multiple allergies 10/14/2016   OBESITY 06/04/2009   Onychomycosis 10/27/2015   Osteomyelitis of left foot (HCC) 05/29/2015   Osteoporosis    Pulmonary sarcoidosis (HCC)    Followed locally by pulmonology, but also by Dr. Boone Buzzard at [facility]   Right knee pain 01/26/2017   Vocal cord dysfunction    Wears partial dentures     Past Surgical History:  Procedure Laterality Date   ABDOMINAL HYSTERECTOMY     APPENDECTOMY     BLADDER SUSPENSION  11/11/2011   Procedure: TRANSVAGINAL TAPE (TVT) PROCEDURE;  Surgeon: Hamp Levine, MD;  Location: [facility];  Service: Gynecology;  Laterality: N/A;   CAROTIDS  02/18/2011   CAROTID DUPLEX; VERTEBRALS ARE PATENT WITH ANTEGRADE FLOW. ICA/CCA RATIO 1.61 ON RIGHT AND 0.75 ON LEFT   CATARACT EXTRACTION, BILATERAL     CHOLECYSTECTOMY  1984   COLONOSCOPY  04/29/2010   Elvin Hammer   CYSTOSCOPY  11/11/2011   Procedure: CYSTOSCOPY;  Surgeon: Hamp Levine, MD;  Location: [facility];  Service: Gynecology;  Laterality: N/A;   EXTUBATION (ENDOTRACHEAL) IN OR N/A 09/23/2017   Procedure: EXTUBATION (ENDOTRACHEAL) IN OR;  Surgeon: Eldon Greenland, MD;  [facility];  Service: ENT;  Laterality: N/A;   FIBEROPTIC LARYNGOSCOPY AND TRACHEOSCOPY N/A 09/23/2017   Procedure: FLEXIBLE FIBEROPTIC LARYNGOSCOPY;  Surgeon: Eldon Greenland, MD;  [facility];  Service: ENT;  Laterality:  N/A;   FRACTURE SURGERY     foot   HALLUX FUSION Left 10/02/2018   Procedure: HALLUX ARTHRODESIS;  Surgeon: Dot Gazella, DPM;  Location: [facility];  Service: Podiatry;  Laterality: Left;   HAMMER TOE SURGERY Left 10/02/2018   Procedure: HAMMER TOE CORRECTION 2ND 3RD 4RD FIFTH TOE;  Surgeon: Dot Gazella, DPM;  Location: [facility];  Service: Podiatry;  Laterality: Left;   HAMMER TOE SURGERY Right 04/12/2019   Procedure: HAMMER TOE CORRECTION, 2ND, 3RD, 4TH AND 5TH TOES OF RIGHT FOOT;  Surgeon: Dot Gazella, DPM;  [facility];  Service: Podiatry;  Laterality: Right;   HAMMER TOE SURGERY Left 10/25/2019   Procedure: HAMMER TOE REPAIR SECOND, THIRD, FOUTH;  Surgeon: Dot Gazella, DPM;  [facility];  Service: Podiatry;  Laterality: Left;   HERNIA REPAIR     I & D EXTREMITY Left 06/27/2015   Procedure: Partial Excision Left Calcaneus, Place Antibiotic Beads, and Wound VAC;  Surgeon: Timothy Ford, MD;  [facility];  Service: Orthopedics;  Laterality: Left;   JOINT REPLACEMENT     KNEE ARTHROSCOPY     right   LEFT AND RIGHT HEART CATHETERIZATION WITH CORONARY ANGIOGRAM N/A 04/23/2013    Procedure: LEFT AND RIGHT HEART CATHETERIZATION WITH CORONARY ANGIOGRAM;  Surgeon: Arleen Lacer, MD;  Location: [facility] CATH LAB;  Service: Cardiovascular;  Laterality: N/A;   LEFT HEART CATH AND CORONARY ANGIOGRAPHY N/A 03/11/2017   Procedure: Left Heart Cath and Coronary Angiography;  Surgeon: Arnoldo Lapping, MD;  Location: [facility] INVASIVE CV LAB; angiographically minimal CAD in the LAD otherwise normal.;  Normal LVEDP.  FALSE POSITIVE MYOVIEW    LEFT HEART CATH AND CORONARY ANGIOGRAPHY  07/20/2010   LVEF 50-55% WITH VERY MILD GLOBAL HYPOKINESIA; ESSENTIALLY NORMAL CORONARY ARTERIES; NORMAL LV FUNCTION   METATARSAL OSTEOTOMY WITH OPEN REDUCTION INTERNAL FIXATION (ORIF) METATARSAL WITH FUSION Left 04/09/2014   Procedure: LEFT FOOT FRACTURE OPEN TREATMENT METATARSAL INCLUDES INTERNAL FIXATION EACH;  Surgeon: Genevie Kerns, MD;  Location: [facility];  Service: Orthopedics;  Laterality: Left;   NISSEN FUNDOPLICATION  2004   NM MYOVIEW  LTD --> FALSE POSITIVE  03/10/2017   : Moderate size stress-induced perfusion defect at the apex as well as ill-defined stress-induced perfusion defect in the lateral wall.  EF 55%.  INTERMEDIATE risk. -->  FALSE POSITIVE   ORIF DISTAL FEMUR FRACTURE  08/08/2020   [facility]: ORIF of left extra-articular periprosthetic distal femur fracture with synthesis of the femur plate with cortical nonlocking screws.  (Thought to be pathologic fracture due to osteoporosis) -> after a fall   ORIF FEMUR W/ PERI-IMPLANT  08/29/2020   [facility]) revision of left femur fracture ORIF plate fixation screws; culture positive for MSSA   Right and left CARDIAC CATHETERIZATION  04/23/2013   Angiographic normal coronaries; LVEDP 20 mmHg, PCWP 12-14 mmHg, RAP 12 mmHg.; Fick CO/CI 4.9/2.2   ROTATOR CUFF REPAIR Right 12/09/2021   SHOULDER ARTHROSCOPY Left 03/14/2019   Procedure: LEFT SHOULDER ARTHROSCOPY, DEBRIDEMENT, AND DECOMPRESSION;  Surgeon: Timothy Ford, MD;  [facility];  Service: Orthopedics;  Laterality: Left;   TOTAL KNEE ARTHROPLASTY Right 06/29/2017   Procedure: RIGHT TOTAL KNEE ARTHROPLASTY;  Surgeon: Timothy Ford, MD;  Location: [facility] OR;  Service: Orthopedics;  Laterality: Right;   TOTAL KNEE ARTHROPLASTY Left 12/07/2017   TOTAL KNEE ARTHROPLASTY Left 12/07/2017   Procedure: LEFT TOTAL KNEE ARTHROPLASTY;  Surgeon: Timothy Ford, MD;  [facility];  Service: Orthopedics;  Laterality: Left;   TRACHEOSTOMY TUBE PLACEMENT N/A 09/20/2017   Procedure: AWAKE INTUBATION WITH ANESTHESIA WITH VIDEO ASSISTANCE;  Surgeon: Eldon Greenland, MD;  [facility];  Service: ENT;  Laterality: N/A;   TRANSTHORACIC ECHOCARDIOGRAM  08/2014   Normal LV size and function.  Mild LVH.  EF 55-60%.  Normal regional wall motion.  GR 1 DD.  Normal RV size and function .   TRANSTHORACIC ECHOCARDIOGRAM  08/12/2020    [facility]): Normal LV size and function.  EF 55 to 60%.  No RWM A.  Normal filling pattern.  Normal RV.  No significant valvular disease-stenosis or regurgitation.  No vegetation.   TUBAL LIGATION     with reversal in 1994   UPPER GASTROINTESTINAL ENDOSCOPY     VENTRAL HERNIA REPAIR      MEDICATIONS:  albuterol  (PROVENTIL ) (2.5 MG/3ML) 0.083% nebulizer solution   albuterol  (VENTOLIN  HFA) 108 (90 Base) MCG/ACT inhaler   aspirin  EC 81 MG tablet   atorvastatin  (LIPITOR) 40 MG tablet   benzonatate  (TESSALON  PERLES) 100 MG capsule   BREO ELLIPTA  200-25 MCG/INH AEPB   buPROPion  (WELLBUTRIN  XL) 300 MG 24 hr tablet   chlorpheniramine -HYDROcodone  (TUSSIONEX) 10-8 MG/5ML   Continuous Glucose Sensor (DEXCOM G7 SENSOR) MISC   cyclobenzaprine  (FLEXERIL ) 5 MG tablet   diltiazem  (CARDIZEM  CD) 120 MG 24 hr capsule   estradiol  (ESTRACE ) 0.1 MG/GM vaginal cream   famotidine  (PEPCID ) 20 MG tablet   fenofibrate  160 MG tablet   fluticasone  (FLONASE ) 50 MCG/ACT nasal spray   furosemide  (LASIX ) 40 MG tablet   gabapentin   (NEURONTIN ) 100 MG capsule   glucose blood (CONTOUR NEXT TEST) test strip   guaiFENesin -dextromethorphan  (ROBITUSSIN DM) 100-10 MG/5ML syrup   insulin  aspart (NOVOLOG  FLEXPEN) 100 UNIT/ML FlexPen   insulin  glargine-yfgn (SEMGLEE , YFGN,) 100 UNIT/ML Pen   Insulin  Pen Needle (PEN NEEDLES) 32G X 4 MM MISC   ipratropium (ATROVENT ) 0.06 % nasal spray   KLOR-CON  M20 20 MEQ tablet   Lancet Devices (EASY MINI EJECT LANCING DEVICE) MISC   LANTUS  SOLOSTAR 100 UNIT/ML Solostar Pen   metFORMIN  (GLUCOPHAGE -XR) 500 MG 24 hr tablet   Microlet Lancets MISC   ondansetron  (ZOFRAN -ODT) 4 MG disintegrating tablet   pantoprazole  (PROTONIX ) 40 MG tablet   telmisartan  (MICARDIS ) 40 MG tablet   tirzepatide  (MOUNJARO ) 7.5 MG/0.5ML Pen   venlafaxine  XR (EFFEXOR -XR) 75 MG 24 hr capsule   No current facility-administered medications for this encounter.    Ella Gun, PA-C Surgical Short Stay/Anesthesiology [facility] Phone [phone] [facility] Phone [phone] 03/15/2024 7:36 PM

## 2024-03-15 NOTE — Anesthesia Preprocedure Evaluation (Addendum)
 Anesthesia Evaluation  Patient identified by MRN, date of birth, ID band Patient awake    Reviewed: Allergy & Precautions, NPO status , Patient's Chart, lab work & pertinent test results, reviewed documented beta blocker date and time   Airway Mallampati: II  TM Distance: >3 FB Neck ROM: Full    Dental  (+) Upper Dentures, Lower Dentures   Pulmonary asthma , sleep apnea , COPD,  COPD inhaler  Pulmonary sarcoidosis    Pulmonary exam normal breath sounds clear to auscultation       Cardiovascular hypertension, Pt. on home beta blockers and Pt. on medications Normal cardiovascular exam Rhythm:Regular Rate:Normal   '23 TTE - EF 55 to 60%. The left ventricular internal cavity size was moderately dilated. Left ventricular diastolic parameters are consistent with Grade I diastolic dysfunction (impaired relaxation). Left atrial size was mildly dilated. A small pericardial effusion is present. No significant valvular d/o identified.    Neuro/Psych  PSYCHIATRIC DISORDERS Anxiety Depression    negative neurological ROS     GI/Hepatic Neg liver ROS,GERD  Medicated and Controlled,,  Endo/Other  diabetes, Type 2, Insulin  Dependent, Oral Hypoglycemic Agents   Obesity On GLP-1a   Renal/GU CRFRenal disease     Musculoskeletal  (+) Arthritis ,   Gout    Abdominal   Peds  Hematology  (+) Blood dyscrasia, anemia HLD   Anesthesia Other Findings Hx vocal cord dysfunction   Reproductive/Obstetrics                             Anesthesia Physical Anesthesia Plan  ASA: 3  Anesthesia Plan: General   Post-op Pain Management: Tylenol  PO (pre-op)*   Induction: Intravenous  PONV Risk Score and Plan: 3 and Ondansetron , Dexamethasone , Midazolam  and Treatment may vary due to age or medical condition  Airway Management Planned: Oral ETT  Additional Equipment: None  Intra-op Plan:   Post-operative Plan:  Extubation in OR  Informed Consent: I have reviewed the patients History and Physical, chart, labs and discussed the procedure including the risks, benefits and alternatives for the proposed anesthesia with the patient or authorized representative who has indicated his/her understanding and acceptance.     Dental advisory given  Plan Discussed with: CRNA and Anesthesiologist  Anesthesia Plan Comments: (PAT note written 03/15/2024 )        Anesthesia Quick Evaluation

## 2024-03-20 ENCOUNTER — Ambulatory Visit (INDEPENDENT_AMBULATORY_CARE_PROVIDER_SITE_OTHER): Admitting: Physician Assistant

## 2024-03-20 ENCOUNTER — Encounter: Payer: Self-pay | Admitting: Physician Assistant

## 2024-03-20 ENCOUNTER — Telehealth: Payer: Self-pay

## 2024-03-20 VITALS — BP 110/60 | HR 53 | Temp 98.1°F | Ht 66.0 in | Wt 198.4 lb

## 2024-03-20 DIAGNOSIS — R350 Frequency of micturition: Secondary | ICD-10-CM

## 2024-03-20 DIAGNOSIS — R109 Unspecified abdominal pain: Secondary | ICD-10-CM | POA: Diagnosis not present

## 2024-03-20 LAB — POCT URINALYSIS DIPSTICK
Bilirubin, UA: POSITIVE
Blood, UA: NEGATIVE
Glucose, UA: NEGATIVE
Ketones, UA: NEGATIVE
Nitrite, UA: NEGATIVE
Protein, UA: POSITIVE — AB
Spec Grav, UA: 1.025 (ref 1.010–1.025)
Urobilinogen, UA: 1 U/dL
pH, UA: 5.5 (ref 5.0–8.0)

## 2024-03-20 LAB — CBC WITH DIFFERENTIAL/PLATELET
Absolute Lymphocytes: 3444 {cells}/uL (ref 850–3900)
Absolute Monocytes: 910 {cells}/uL (ref 200–950)
Basophils Absolute: 98 {cells}/uL (ref 0–200)
Basophils Relative: 0.7 %
Eosinophils Absolute: 392 {cells}/uL (ref 15–500)
Eosinophils Relative: 2.8 %
HCT: 32.8 % — ABNORMAL LOW (ref 35.0–45.0)
Hemoglobin: 10.8 g/dL — ABNORMAL LOW (ref 11.7–15.5)
MCH: 30 pg (ref 27.0–33.0)
MCHC: 32.9 g/dL (ref 32.0–36.0)
MCV: 91.1 fL (ref 80.0–100.0)
MPV: 9.6 fL (ref 7.5–12.5)
Monocytes Relative: 6.5 %
Neutro Abs: 9156 {cells}/uL — ABNORMAL HIGH (ref 1500–7800)
Neutrophils Relative %: 65.4 %
Platelets: 386 10*3/uL (ref 140–400)
RBC: 3.6 10*6/uL — ABNORMAL LOW (ref 3.80–5.10)
RDW: 12 % (ref 11.0–15.0)
Total Lymphocyte: 24.6 %
WBC: 14 10*3/uL — ABNORMAL HIGH (ref 3.8–10.8)

## 2024-03-20 LAB — COMPREHENSIVE METABOLIC PANEL WITH GFR
AG Ratio: 2.1 (calc) (ref 1.0–2.5)
ALT: 14 U/L (ref 6–29)
AST: 18 U/L (ref 10–35)
Albumin: 4.6 g/dL (ref 3.6–5.1)
Alkaline phosphatase (APISO): 68 U/L (ref 37–153)
BUN/Creatinine Ratio: 12 (calc) (ref 6–22)
BUN: 22 mg/dL (ref 7–25)
CO2: 28 mmol/L (ref 20–32)
Calcium: 9.2 mg/dL (ref 8.6–10.4)
Chloride: 99 mmol/L (ref 98–110)
Creat: 1.77 mg/dL — ABNORMAL HIGH (ref 0.50–1.05)
Globulin: 2.2 g/dL (ref 1.9–3.7)
Glucose, Bld: 113 mg/dL — ABNORMAL HIGH (ref 65–99)
Potassium: 4.5 mmol/L (ref 3.5–5.3)
Sodium: 136 mmol/L (ref 135–146)
Total Bilirubin: 0.7 mg/dL (ref 0.2–1.2)
Total Protein: 6.8 g/dL (ref 6.1–8.1)
eGFR: 32 mL/min/{1.73_m2} — ABNORMAL LOW (ref >=60)

## 2024-03-20 MED ORDER — AMOXICILLIN-POT CLAVULANATE 875-125 MG PO TABS: 1.0000 | ORAL_TABLET | Freq: Two times a day (BID) | ORAL | 0 refills | Status: DC

## 2024-03-20 NOTE — Telephone Encounter (Signed)
 See below. Copied from CRM [id number]. Topic: Clinical - Medication Question >> Mar 20, 2024  3:54 PM Albertha Alosa wrote: Reason for CRM: Patient husband called in regarding prescription, stated that PA Ova Bloomer stated she was prescribing her Zofran  , ut they did not receive it would like for it to be sent to the pharmacy  [name] [place], [place] - [address] [place] [address] Phone: [phone] Fax: [phone]

## 2024-03-20 NOTE — Patient Instructions (Signed)
 It was great to see you!  Start Augmentin   Stat CT of abdomen -- someone will call you to schedule  We will update blood work  If worsening symptom(s), go to the ER  Take care,  Charice Zuno PA-C

## 2024-03-20 NOTE — Progress Notes (Signed)
 [name] is a 63 y.o. female here for a new problem.  History of Present Illness:   Chief Complaint  Patient presents with   Diarrhea    Pt c/o diarrhea since yesterday 3-4 times, none today.   Abdominal Pain    Pt c/o abdominal pain and nausea started today.   She is accompanied by her friend.   UTI: Pt complains of urinary frequency and dysuria starting Sunday night, diarrhea starting yesterday, and abdominal pain and nausea starting today.  Reports 3-4 episodes of diarrhea yesterday; no episodes today.  Eating overall well, but notes her appetite has decreased since starting Mounjaro.  Tolerating Mounjaro well with no adverse side effects.  No food intolerances.  She notes she has an abdominal hernia. Has hernia repair scheduled on 6/16.  She notes her current abdominal pain is not similar to he hernia pain or renal stone pain she has experienced in the past.  [name] agreeable to have imaging done.  Denies any unusual back pain or respiratory symptoms.   Past Medical History:  Diagnosis Date   Abnormal SPEP 04/17/2014   Acute left ankle pain 01/26/2017   Allergy    ANEMIA-UNSPECIFIED 09/18/2009   Anxiety    Arthritis    Blood transfusion without reported diagnosis    Cataract    CHF (congestive heart failure) (HCC)    Chronic diastolic heart failure, NYHA class 2 (HCC)    Normal LVEDP by May 2018   COPD (chronic obstructive pulmonary disease) (HCC)    Depression    DIABETES MELLITUS, TYPE II 08/21/2006   Diabetic osteomyelitis (HCC) 05/29/2015   Fatty liver    Fracture of 5th metatarsal    non union   GERD 08/21/2006   Had fundoplication   GOUT 08/20/2010   Hammer toe of right foot    2-5th toes   Hx of umbilical hernia repair    HYPERLIPIDEMIA 08/21/2006   HYPERTENSION 08/21/2006   Infection of wound due to methicillin resistant Staphylococcus aureus (MRSA)    Internal hemorrhoids    Kidney problem    Multiple allergies 10/14/2016   OBESITY  06/04/2009   Onychomycosis 10/27/2015   Osteomyelitis of left foot (HCC) 05/29/2015   Osteoporosis    Pulmonary sarcoidosis (HCC)    Followed locally by pulmonology, but also by Dr. Boone Buzzard at [facility]   Right knee pain 01/26/2017   Vocal cord dysfunction    Wears partial dentures      Social History   Tobacco Use   Smoking status: Never   Smokeless tobacco: Never  Vaping Use   Vaping status: Never Used  Substance Use Topics   Alcohol use: No   Drug use: No    Past Surgical History:  Procedure Laterality Date   ABDOMINAL HYSTERECTOMY     APPENDECTOMY     BLADDER SUSPENSION  11/11/2011   Procedure: TRANSVAGINAL TAPE (TVT) PROCEDURE;  Surgeon: Hamp Levine, MD;  Location: [facility];  Service: Gynecology;  Laterality: N/A;   CAROTIDS  02/18/2011   CAROTID DUPLEX; VERTEBRALS ARE PATENT WITH ANTEGRADE FLOW. ICA/CCA RATIO 1.61 ON RIGHT AND 0.75 ON LEFT   CATARACT EXTRACTION, BILATERAL     CHOLECYSTECTOMY  1984   COLONOSCOPY  04/29/2010   Elvin Hammer   CYSTOSCOPY  11/11/2011   Procedure: CYSTOSCOPY;  Surgeon: Hamp Levine, MD;  Location: [facility];  Service: Gynecology;  Laterality: N/A;   EXTUBATION (ENDOTRACHEAL) IN OR N/A 09/23/2017   Procedure: EXTUBATION (ENDOTRACHEAL) IN OR;  Surgeon: Eldon Greenland, MD;  Location: [facility] OR;  Service: ENT;  Laterality: N/A;   FIBEROPTIC LARYNGOSCOPY AND TRACHEOSCOPY N/A 09/23/2017   Procedure: FLEXIBLE FIBEROPTIC LARYNGOSCOPY;  Surgeon: Eldon Greenland, MD;  Location: [facility] OR;  Service: ENT;  Laterality: N/A;   FRACTURE SURGERY     foot   HALLUX FUSION Left 10/02/2018   Procedure: HALLUX ARTHRODESIS;  Surgeon: Dot Gazella, DPM;  Location: [facility];  Service: Podiatry;  Laterality: Left;   HAMMER TOE SURGERY Left 10/02/2018   Procedure: HAMMER TOE CORRECTION 2ND 3RD 4RD FIFTH TOE;  Surgeon: Dot Gazella, DPM;  Location: [facility];  Service: Podiatry;  Laterality: Left;   HAMMER TOE SURGERY Right 04/12/2019   Procedure:  HAMMER TOE CORRECTION, 2ND, 3RD, 4TH AND 5TH TOES OF RIGHT FOOT;  Surgeon: Dot Gazella, DPM;  Location: [facility] OR;  Service: Podiatry;  Laterality: Right;   HAMMER TOE SURGERY Left 10/25/2019   Procedure: HAMMER TOE REPAIR SECOND, THIRD, FOUTH;  Surgeon: Dot Gazella, DPM;  Location: [facility] OR;  Service: Podiatry;  Laterality: Left;   HERNIA REPAIR     I & D EXTREMITY Left 06/27/2015   Procedure: Partial Excision Left Calcaneus, Place Antibiotic Beads, and Wound VAC;  Surgeon: Timothy Ford, MD;  Location: [facility] OR;  Service: Orthopedics;  Laterality: Left;   JOINT REPLACEMENT     KNEE ARTHROSCOPY     right   LEFT AND RIGHT HEART CATHETERIZATION WITH CORONARY ANGIOGRAM N/A 04/23/2013   Procedure: LEFT AND RIGHT HEART CATHETERIZATION WITH CORONARY ANGIOGRAM;  Surgeon: Arleen Lacer, MD;  Location: [facility] CATH LAB;  Service: Cardiovascular;  Laterality: N/A;   LEFT HEART CATH AND CORONARY ANGIOGRAPHY N/A 03/11/2017   Procedure: Left Heart Cath and Coronary Angiography;  Surgeon: Arnoldo Lapping, MD;  Location: [facility] INVASIVE CV LAB; angiographically minimal CAD in the LAD otherwise normal.;  Normal LVEDP.  FALSE POSITIVE MYOVIEW    LEFT HEART CATH AND CORONARY ANGIOGRAPHY  07/20/2010   LVEF 50-55% WITH VERY MILD GLOBAL HYPOKINESIA; ESSENTIALLY NORMAL CORONARY ARTERIES; NORMAL LV FUNCTION   METATARSAL OSTEOTOMY WITH OPEN REDUCTION INTERNAL FIXATION (ORIF) METATARSAL WITH FUSION Left 04/09/2014   Procedure: LEFT FOOT FRACTURE OPEN TREATMENT METATARSAL INCLUDES INTERNAL FIXATION EACH;  Surgeon: Genevie Kerns, MD;  Location: [facility];  Service: Orthopedics;  Laterality: Left;   NISSEN FUNDOPLICATION  2004   NM MYOVIEW  LTD --> FALSE POSITIVE  03/10/2017   : Moderate size "stress-induced "perfusion defect at the apex as well as "ill-defined stress-induced perfusion defect in the lateral wall.  EF 55%.  INTERMEDIATE risk. -->  FALSE POSITIVE   ORIF DISTAL FEMUR FRACTURE  08/08/2020   [facility]  [place]: ORIF of left extra-articular periprosthetic distal femur fracture with synthesis of the femur plate with cortical nonlocking screws.  (Thought to be pathologic fracture due to osteoporosis) -> after a fall   ORIF FEMUR W/ PERI-IMPLANT  08/29/2020   [facility]) revision of left femur fracture ORIF plate fixation screws; culture positive for MSSA   Right and left CARDIAC CATHETERIZATION  04/23/2013   Angiographic normal coronaries; LVEDP 20 mmHg, PCWP 12-14 mmHg, RAP 12 mmHg.; Fick CO/CI 4.9/2.2   ROTATOR CUFF REPAIR Right 12/09/2021   SHOULDER ARTHROSCOPY Left 03/14/2019   Procedure: LEFT SHOULDER ARTHROSCOPY, DEBRIDEMENT, AND DECOMPRESSION;  Surgeon: Timothy Ford, MD;  Location: [facility];  Service: Orthopedics;  Laterality: Left;   TOTAL KNEE ARTHROPLASTY Right 06/29/2017   Procedure: RIGHT TOTAL KNEE ARTHROPLASTY;  Surgeon: Timothy Ford, MD;  Location: [facility] OR;  Service: Orthopedics;  Laterality: Right;   TOTAL KNEE ARTHROPLASTY Left 12/07/2017   TOTAL KNEE ARTHROPLASTY Left 12/07/2017   Procedure: LEFT TOTAL KNEE ARTHROPLASTY;  Surgeon: Timothy Ford, MD;  Location: [facility] OR;  Service: Orthopedics;  Laterality: Left;   TRACHEOSTOMY TUBE PLACEMENT N/A 09/20/2017   Procedure: AWAKE INTUBATION WITH ANESTHESIA WITH VIDEO ASSISTANCE;  Surgeon: Eldon Greenland, MD;  Location: [facility];  Service: ENT;  Laterality: N/A;   TRANSTHORACIC ECHOCARDIOGRAM  08/2014   Normal LV size and function.  Mild LVH.  EF 55-60%.  Normal regional wall motion.  GR 1 DD.  Normal RV size and function .   TRANSTHORACIC ECHOCARDIOGRAM  08/12/2020    [facility]): Normal LV size and function.  EF 55 to 60%.  No RWM A.  Normal filling pattern.  Normal RV.  No significant valvular disease-stenosis or regurgitation.  No vegetation.   TUBAL LIGATION     with reversal in 1994   UPPER GASTROINTESTINAL ENDOSCOPY     VENTRAL HERNIA REPAIR      Family History  Problem Relation Age of Onset    Diabetes Father    Heart attack Father 33   Coronary artery disease Father    Heart failure Father    Throat cancer Father    Hypertension Father    Heart disease Father    Sleep apnea Father    Obesity Father    COPD Mother    Emphysema Mother    Asthma Mother    Heart failure Mother    Breast cancer Mother    Diabetes Mother    Kidney disease Mother    Thyroid  disease Mother    Heart attack Maternal Grandfather    Sarcoidosis Maternal Uncle    Lung cancer Brother    Diabetes Brother    Diabetes Brother    Colon cancer Neg Hx    Colon polyps Neg Hx    Esophageal cancer Neg Hx    Rectal cancer Neg Hx    Stomach cancer Neg Hx     Allergies  Allergen Reactions   Firvanq  [Vancomycin ] Other (See Comments)    Dose related nephrotoxicity    Trexall [Methotrexate] Other (See Comments)    Peri-oral and buccal lesions   Zestril  [Lisinopril ] Cough   Chlorhexidine  Itching   Cleocin  [Clindamycin ] Nausea And Vomiting and Rash   Lincocin [Lincomycin Hcl] Nausea And Vomiting and Rash   Teflaro [Ceftaroline] Rash and Other (See Comments)    Tolerates ceftriaxone      Current Medications:   Current Outpatient Medications:    albuterol  (PROVENTIL ) (2.5 MG/3ML) 0.083% nebulizer solution, Take 3 mLs (2.5 mg total) by nebulization every 6 (six) hours as needed for wheezing or shortness of breath (Use 2-3 times per day when sick.)., Disp: 75 mL, Rfl: 2   albuterol  (VENTOLIN  HFA) 108 (90 Base) MCG/ACT inhaler, Inhale 1-2 puffs into the lungs every 4 (four) hours as needed for wheezing or shortness of breath., Disp: 8.5 each, Rfl: 1   amoxicillin -clavulanate (AUGMENTIN ) 875-125 MG tablet, Take 1 tablet by mouth 2 (two) times daily., Disp: 20 tablet, Rfl: 0   aspirin  EC 81 MG tablet, Take 1 tablet (81 mg total) by mouth daily., Disp: 30 tablet, Rfl: 11   atorvastatin  (LIPITOR) 40 MG tablet, Take 1 tablet (40 mg total) by mouth daily., Disp: 90 tablet, Rfl: 3   benzonatate  (TESSALON  PERLES)  100 MG capsule, Take 1-2 capsules (  100-200 mg total) by mouth 3 (three) times daily as needed for cough., Disp: 30 capsule, Rfl: 3   BREO ELLIPTA  200-25 MCG/INH AEPB, TAKE 1 PUFF BY MOUTH EVERY DAY, Disp: 60 each, Rfl: 5   buPROPion  (WELLBUTRIN  XL) 300 MG 24 hr tablet, TAKE 1 TABLET BY MOUTH EVERY DAY, Disp: 90 tablet, Rfl: 3   chlorpheniramine -HYDROcodone  (TUSSIONEX) 10-8 MG/5ML, Take 5 mLs by mouth at bedtime as needed for cough (do not drive for 8 hours after taking)., Disp: 115 mL, Rfl: 0   Continuous Glucose Sensor (DEXCOM G7 SENSOR) MISC, 1 DEVICE BY DOES NOT APPLY ROUTE AS DIRECTED. CHANGE SENSOR EVERY 10 DAYS, Disp: 3 each, Rfl: 3   cyclobenzaprine  (FLEXERIL ) 5 MG tablet, Take 1 tablet (5 mg total) by mouth 3 (three) times daily as needed for muscle spasms (or Headache)., Disp: 30 tablet, Rfl: 0   diltiazem  (CARDIZEM  CD) 120 MG 24 hr capsule, Take 1 capsule (120 mg total) by mouth daily., Disp: 90 capsule, Rfl: 3   estradiol  (ESTRACE ) 0.1 MG/GM vaginal cream, Place 0.5 Applicatorfuls vaginally daily as needed (irritation)., Disp: , Rfl:    famotidine  (PEPCID ) 20 MG tablet, TAKE 1 TABLET BY MOUTH TWICE A DAY, Disp: 180 tablet, Rfl: 1   fenofibrate  160 MG tablet, TAKE 1 TABLET BY MOUTH EVERY DAY, Disp: 90 tablet, Rfl: 1   fluticasone  (FLONASE ) 50 MCG/ACT nasal spray, Place 2 sprays into both nostrils daily as needed for allergies., Disp: 48 mL, Rfl: 3   furosemide  (LASIX ) 40 MG tablet, Take 1 tablet (40 mg total) by mouth daily., Disp: 90 tablet, Rfl: 3   gabapentin  (NEURONTIN ) 100 MG capsule, TAKE 1 CAPSULE BY MOUTH THREE TIMES A DAY, Disp: 270 capsule, Rfl: 3   glucose blood (CONTOUR NEXT TEST) test strip, 1 each by Other route 4 (four) times daily. And lancets 4/day, Disp: 400 each, Rfl: 3   guaiFENesin -dextromethorphan  (ROBITUSSIN DM) 100-10 MG/5ML syrup, Take 5 mLs by mouth every 4 (four) hours as needed for cough (chest congestion)., Disp: 118 mL, Rfl: 0   Insulin  Pen Needle (PEN NEEDLES)  32G X 4 MM MISC, 1 each by Does not apply route 4 (four) times daily. E11.9, Disp: 400 each, Rfl: 0   ipratropium (ATROVENT ) 0.06 % nasal spray, PLACE 2 SPRAYS INTO BOTH NOSTRILS 4 TIMES DAILY., Disp: 45 mL, Rfl: 3   KLOR-CON  M20 20 MEQ tablet, TAKE 1 & 1/2 TABLETS BY MOUTH TWICE A DAY, Disp: 270 tablet, Rfl: 2   Lancet Devices (EASY MINI EJECT LANCING DEVICE) MISC, Use as directed 4 times daily to test blood sugar, Disp: 1 each, Rfl: 3   LANTUS  SOLOSTAR 100 UNIT/ML Solostar Pen, Inject 24 Units into the skin daily., Disp: , Rfl:    metFORMIN  (GLUCOPHAGE -XR) 500 MG 24 hr tablet, TAKE 4 TABLETS BY MOUTH DAILY WITH BREAKFAST., Disp: 360 tablet, Rfl: 0   Microlet Lancets MISC, 1 each by Does not apply route 2 (two) times daily. E11.9, Disp: 100 each, Rfl: 2   ondansetron  (ZOFRAN -ODT) 4 MG disintegrating tablet, Take 1 tablet (4 mg total) by mouth every 8 (eight) hours as needed for nausea or vomiting., Disp: 20 tablet, Rfl: 0   pantoprazole  (PROTONIX ) 40 MG tablet, TAKE 1 TABLET BY MOUTH TWICE A DAY, Disp: 180 tablet, Rfl: 1   telmisartan  (MICARDIS ) 40 MG tablet, TAKE 1 TABLET BY MOUTH EVERY DAY, Disp: 90 tablet, Rfl: 3   tirzepatide (MOUNJARO) 7.5 MG/0.5ML Pen, Inject 7.5 mg into the skin once a week., Disp: 6  mL, Rfl: 0   venlafaxine  XR (EFFEXOR -XR) 75 MG 24 hr capsule, TAKE 1 CAPSULE BY MOUTH TWICE A DAY, Disp: 180 capsule, Rfl: 3   Review of Systems:   Negative unless otherwise specified per HPI.  Vitals:   Vitals:   03/20/24 1412  BP: 110/60  Pulse: (!) 53  Temp: 98.1 F (36.7 C)  TempSrc: Temporal  SpO2: 98%  Weight: 198 lb 6.1 oz (90 kg)  Height: 5\' 6"  (1.676 m)     Body mass index is 32.02 kg/m.  Physical Exam:   Physical Exam Vitals and nursing note reviewed.  Constitutional:      General: She is not in acute distress.    Appearance: She is well-developed. She is not ill-appearing or toxic-appearing.  Cardiovascular:     Rate and Rhythm: Normal rate and regular rhythm.      Pulses: Normal pulses.     Heart sounds: Normal heart sounds, S1 normal and S2 normal.  Pulmonary:     Effort: Pulmonary effort is normal.     Breath sounds: Normal breath sounds.  Abdominal:     General: Abdomen is flat. Bowel sounds are normal.     Palpations: Abdomen is soft.     Tenderness: There is abdominal tenderness in the periumbilical area. There is no right CVA tenderness or left CVA tenderness.  Skin:    General: Skin is warm and dry.  Neurological:     Mental Status: She is alert.     GCS: GCS eye subscore is 4. GCS verbal subscore is 5. GCS motor subscore is 6.  Psychiatric:        Speech: Speech normal.        Behavior: Behavior normal. Behavior is cooperative.    Results for orders placed or performed in visit on 03/20/24  POCT urinalysis dipstick  Result Value Ref Range   Color, UA Amber    Clarity, UA Cloudy    Glucose, UA Negative Negative   Bilirubin, UA Positive    Ketones, UA Negative    Spec Grav, UA 1.025 1.010 - 1.025   Blood, UA Negative    pH, UA 5.5 5.0 - 8.0   Protein, UA Positive (A) Negative   Urobilinogen, UA 1.0 0.2 or 1.0 E.U./dL   Nitrite, UA Negative    Leukocytes, UA Large (3+) (A) Negative   Appearance     Odor      Assessment and Plan:   1. Abdominal pain, unspecified abdominal location Unclear etiology Her vitals are stable and I do not feel as though she has an acute abdomen on my exam that warrants ER evaluation We will order stat CT abdomen (without contrast due to recent increase in creatinine) Repeat CBC and CMP today Will order Augmentin  for UTI, see below for possible intra-abdominal infection If any worsening symptoms, needs to go to the emergency room - POCT urinalysis dipstick - Urine Culture - CBC with Differential/Platelet - Comprehensive metabolic panel with GFR - CT ABDOMEN PELVIS WO CONTRAST; Future  2. Frequent urination Concern for possible UTI based on UA and history Will trial Augmentin  for urinary  tract infection symptoms and also for any sort of intra-abdominal infection as we are ruling that out, see above Push fluids If any new or worsening symptoms in the meantime, she was instructed to go to the emergency room  I, Bernita Bristle, acting as a scribe for Alexander Iba, [place]., have documented all relevant documentation on the behalf of Alexander Iba, [place], as  directed by   while in the presence of Alexander Iba, [place].  I, Alexander Iba, [place], have reviewed all documentation for this visit. The documentation on 03/20/24 for the exam, diagnosis, procedures, and orders are all accurate and complete.  Alexander Iba, PA-C

## 2024-03-21 ENCOUNTER — Encounter (HOSPITAL_COMMUNITY): Payer: Self-pay

## 2024-03-21 ENCOUNTER — Ambulatory Visit (HOSPITAL_COMMUNITY)
Admission: RE | Admit: 2024-03-21 | Discharge: 2024-03-21 | Disposition: A | Discharge status: Home / Self Care | Source: Ambulatory Visit | Attending: Physician Assistant | Admitting: Physician Assistant

## 2024-03-21 ENCOUNTER — Other Ambulatory Visit: Payer: Self-pay | Admitting: Student

## 2024-03-21 ENCOUNTER — Ambulatory Visit: Payer: Self-pay | Admitting: Physician Assistant

## 2024-03-21 ENCOUNTER — Ambulatory Visit

## 2024-03-21 DIAGNOSIS — R109 Unspecified abdominal pain: Secondary | ICD-10-CM | POA: Diagnosis present

## 2024-03-21 MED ORDER — ONDANSETRON 4 MG PO TBDP: 4.0000 mg | ORAL_TABLET | Freq: Three times a day (TID) | ORAL | 0 refills | Status: DC | PRN

## 2024-03-21 NOTE — Telephone Encounter (Signed)
 Spoke to pt told her Rx for Zofran  sent to pharmacy. Pt verbalized understanding.

## 2024-03-21 NOTE — Telephone Encounter (Signed)
 [name] okay to send in Zofran  for patient?

## 2024-03-21 NOTE — Addendum Note (Signed)
 Addended by: Winona Haw on: 03/21/2024 09:54 AM   Modules accepted: Orders

## 2024-03-22 LAB — URINE CULTURE
MICRO NUMBER:: 16561664
SPECIMEN QUALITY:: ADEQUATE

## 2024-03-23 NOTE — H&P (Signed)
 History of Present Illness: [name] is a 63 y.o. female who is seen today as an office consultation at the request of Dr. Room for evaluation of No chief complaint on file. Aaron Aas   History of Present Illness [name] is a 63 year old female who presents with abdominal pain. She was referred by Dr. Arlene Ben for evaluation of a hernia.  She experiences severe, constant abdominal pain for one to two months, primarily on the right side. The pain worsens at night, disrupts sleep, and is unrelieved by Tylenol . There is no nausea, vomiting, diarrhea, or constipation.  Her surgical history includes fundoplication for reflux, ventral hernia repair with mesh, and gallbladder removal with a drain tube. The current pain is near the gallbladder surgery scar.  She has diabetes managed with a Dexcom monitor, hypertension, and has lost significant weight from nearly 300 pounds to 204 pounds. Recent heavy lifting while helping friends move may have exacerbated her symptoms.    Pts sees Dr. Addie Holstein    Review of Systems: A complete review of systems was obtained from the patient.  I have reviewed this information and discussed as appropriate with the patient.  See HPI as well for other ROS.  Review of Systems  Constitutional: Negative.   HENT: Negative.    Eyes: Negative.   Respiratory: Negative.    Cardiovascular: Negative.   Gastrointestinal: Negative.   Genitourinary: Negative.   Musculoskeletal: Negative.   Skin: Negative.   Neurological: Negative.   Endo/Heme/Allergies: Negative.   Psychiatric/Behavioral: Negative.        Medical History: Past Medical History: Diagnosis Date  Arthritis   CHF (congestive heart failure) (CMS/HHS-HCC)   Chronic kidney disease   Hypertension    There is no problem list on file for this patient.   Past Surgical History: Procedure Laterality Date  CHOLECYSTECTOMY    HERNIA REPAIR    REPLACEMENT TOTAL KNEE       Allergies Allergen Reactions  Lisinopril  Anaphylaxis and Cough   Other reaction(s): Cough  Methotrexate Anaphylaxis and Other (See Comments)   Peri-oral and buccal lesions  Other reaction(s): SWELLING/EDEMA  REACTION: peri-oral and buccal lesions.  Vancomycin  Other (See Comments)   Dose related nephrotoxicity   nephrotoxicity  Chlorhexidine  Itching   Current Outpatient Medications on File Prior to Visit Medication Sig Dispense Refill  albuterol  (PROVENTIL ) 2.5 mg /3 mL (0.083 %) nebulizer solution Inhale 2.5 mg into the lungs    aspirin  81 MG EC tablet aspirin  81 mg tablet,delayed release  TAKE 2 TABLETS (162 MG TOTAL) BY MOUTH DAILY FOR 30 DAYS.    atorvastatin  (LIPITOR) 40 MG tablet Take 40 mg by mouth once daily    benzonatate  (TESSALON ) 100 MG capsule Take 100-200 mg by mouth    buPROPion  (WELLBUTRIN  XL) 300 MG XL tablet Take 1 tablet by mouth once daily    cyclobenzaprine  (FLEXERIL ) 5 MG tablet Take 5 mg by mouth    dilTIAZem  (CARDIZEM  CD) 120 MG XR capsule Take 120 mg by mouth once daily    estradioL  (ESTRACE ) 0.01 % (0.1 mg/gram) vaginal cream Place 0.5 Applicatorfuls vaginally    famotidine  (PEPCID ) 20 MG tablet Take 1 tablet by mouth 2 (two) times daily    fenofibrate  160 MG tablet Take 1 tablet by mouth once daily    fluticasone  propionate (FLONASE ) 50 mcg/actuation nasal spray Place 2 sprays into one nostril    FUROsemide  (LASIX ) 40 MG tablet Take 40 mg by mouth once daily    gabapentin  (NEURONTIN ) 100 MG  capsule Take 1 capsule by mouth 3 (three) times daily    HYDROcodone -chlorpheniramine  (TUSSIONEX) 10-8 mg/5 mL ER suspension Take 5 mLs by mouth    ipratropium (ATROVENT ) 0.06 % nasal spray PLACE 2 SPRAYS INTO BOTH NOSTRILS 4 TIMES DAILY.    KLOR-CON  M20 20 mEq ER tablet TAKE 1 & 1/2 TABLETS BY MOUTH TWICE A DAY    LANTUS  SOLOSTAR U-100 INSULIN  pen injector (concentration 100 units/mL) INJECT 16 UNITS INTO THE SKIN EVERY MORNING.    metFORMIN  (GLUCOPHAGE -XR) 500 MG  XR tablet Take 4 tablets by mouth daily with breakfast    MOUNJARO 7.5 mg/0.5 mL pen injector Inject 7.5 mg subcutaneously    NOVOLOG  FLEXPEN U-100 INSULIN  pen injector (concentration 100 units/mL) Inject 15 Units subcutaneously    ondansetron  (ZOFRAN -ODT) 4 MG disintegrating tablet Take 4 mg by mouth every 8 (eight) hours as needed    pantoprazole  (PROTONIX ) 40 MG DR tablet Take 1 tablet by mouth 2 (two) times daily    telmisartan  (MICARDIS ) 40 MG tablet Take 1 tablet by mouth once daily    tiZANidine  (ZANAFLEX ) 4 MG tablet tizanidine  4 mg tablet  TAKE 0.5-1 TABLETS (2-4 MG TOTAL) BY MOUTH EVERY 8 (EIGHT) HOURS AS NEEDED FOR MUSCLE SPASMS.    venlafaxine  (EFFEXOR -XR) 75 MG XR capsule Take 1 capsule by mouth 2 (two) times daily    No current facility-administered medications on file prior to visit.   Family History Problem Relation Age of Onset  High blood pressure (Hypertension) Mother   Diabetes Mother   Skin cancer Father   Obesity Father   High blood pressure (Hypertension) Father   Hyperlipidemia (Elevated cholesterol) Father   Diabetes Father   Myocardial Infarction (Heart attack) Father   Obesity Sister   Diabetes Brother   Obesity Brother     Social History  Tobacco Use Smoking Status Never Smokeless Tobacco Never    Social History  Socioeconomic History  Marital status: Married Tobacco Use  Smoking status: Never  Smokeless tobacco: Never  Social Drivers of Catering manager Strain: Low Risk  (10/10/2023)  Received from [facility]  Overall Financial Resource Strain (CARDIA)   Difficulty of Paying Living Expenses: Not hard at all Food Insecurity: No Food Insecurity (10/10/2023)  Received from [facility]  Hunger Vital Sign   Worried About Running Out of Food in the Last Year: Never true   Ran Out of Food in the Last Year: Never true Transportation Needs: No Transportation Needs (10/10/2023)  Received from [facility] -  Transportation   Lack of Transportation (Medical): No   Lack of Transportation (Non-Medical): No Physical Activity: Insufficiently Active (10/10/2023)  Received from [facility]  Exercise Vital Sign   Days of Exercise per Week: 3 days   Minutes of Exercise per Session: 30 min Stress: No Stress Concern Present (10/10/2023)  Received from [facility]ational [facility] - Occupational Stress Questionnaire   Feeling of Stress : Not at all Social Connections: Socially Integrated (10/10/2023)  Received from [facility]  Social Connection and Isolation Panel [NHANES]   Frequency of Communication with Friends and Family: More than three times a week   Frequency of Social Gatherings with Friends and Family: Twice a week   Attends Religious Services: More than 4 times per year   Active Member of [employer] or Organizations: No   Attends Engineer, structural: More than 4 times per year   Marital Status: Married Housing Stability: Unknown (02/08/2024)  Housing  Stability Vital Sign   Homeless in the Last Year: No   Objective:   Vitals:  02/08/24 1343 BP: (!) 160/76 Pulse: 93 Weight: 92.9 kg (204 lb 12.8 oz) Height: 167.6 cm (5\' 6" ) PainSc:   8   Body mass index is 33.06 kg/m.  Physical Exam Constitutional:      Appearance: Normal appearance.  HENT:     Head: Normocephalic and atraumatic.     Mouth/Throat:     Mouth: Mucous membranes are moist.     Pharynx: Oropharynx is clear.  Eyes:     General: No scleral icterus.    Pupils: Pupils are equal, round, and reactive to light.  Cardiovascular:     Rate and Rhythm: Normal rate and regular rhythm.     Pulses: Normal pulses.     Heart sounds: No murmur heard.    No friction rub. No gallop.  Pulmonary:     Effort: Pulmonary effort is normal. No respiratory distress.     Breath sounds: Normal breath sounds. No stridor.  Abdominal:     General: Abdomen is flat.     Hernia: A hernia is present.      Musculoskeletal:        General: No swelling.  Skin:    General: Skin is warm.  Neurological:     General: No focal deficit present.     Mental Status: She is alert and oriented to person, place, and time. Mental status is at baseline.  Psychiatric:        Mood and Affect: Mood normal.        Thought Content: Thought content normal.        Judgment: Judgment normal.      Hernia Size:2cm Incarcerated: no Yes Initial Hernia   Assessment and Plan: Diagnoses and all orders for this visit:  Incisional hernia without obstruction or gangrene    [name] is a 63 y.o. female  PT will require Cardiac eval prior to scheduling surgery  1.  We will proceed to the OR for a lap ventral hernia repair with mesh. 2. All risks and benefits were discussed with the patient, to generally include infection, bleeding, damage to surrounding structures, acute and chronic nerve pain, and recurrence. Alternatives were offered and described.  All questions were answered and the patient voiced understanding of the procedure and wishes to proceed at this point.       No follow-ups on file.  Shela Derby, MD, [facility], [place] [facility]

## 2024-03-26 ENCOUNTER — Ambulatory Visit (HOSPITAL_COMMUNITY): Payer: Self-pay | Admitting: Vascular Surgery

## 2024-03-26 ENCOUNTER — Ambulatory Visit (HOSPITAL_COMMUNITY)
Admission: RE | Admit: 2024-03-26 | Discharge: 2024-03-26 | Disposition: A | Discharge status: Home / Self Care | Attending: General Surgery | Admitting: General Surgery

## 2024-03-26 ENCOUNTER — Other Ambulatory Visit: Payer: Self-pay

## 2024-03-26 ENCOUNTER — Encounter (HOSPITAL_COMMUNITY): Payer: Self-pay | Admitting: General Surgery

## 2024-03-26 ENCOUNTER — Encounter (HOSPITAL_COMMUNITY)
Admission: RE | Disposition: A | Discharge status: Home / Self Care | Payer: Self-pay | Source: Home / Self Care | Attending: General Surgery

## 2024-03-26 DIAGNOSIS — K219 Gastro-esophageal reflux disease without esophagitis: Secondary | ICD-10-CM | POA: Insufficient documentation

## 2024-03-26 DIAGNOSIS — J4489 Other specified chronic obstructive pulmonary disease: Secondary | ICD-10-CM | POA: Insufficient documentation

## 2024-03-26 DIAGNOSIS — Z794 Long term (current) use of insulin: Secondary | ICD-10-CM | POA: Diagnosis not present

## 2024-03-26 DIAGNOSIS — D860 Sarcoidosis of lung: Secondary | ICD-10-CM | POA: Insufficient documentation

## 2024-03-26 DIAGNOSIS — K432 Incisional hernia without obstruction or gangrene: Secondary | ICD-10-CM | POA: Diagnosis present

## 2024-03-26 DIAGNOSIS — K660 Peritoneal adhesions (postprocedural) (postinfection): Secondary | ICD-10-CM | POA: Diagnosis not present

## 2024-03-26 DIAGNOSIS — I5032 Chronic diastolic (congestive) heart failure: Secondary | ICD-10-CM | POA: Insufficient documentation

## 2024-03-26 DIAGNOSIS — Z01818 Encounter for other preprocedural examination: Secondary | ICD-10-CM

## 2024-03-26 DIAGNOSIS — I130 Hypertensive heart and chronic kidney disease with heart failure and stage 1 through stage 4 chronic kidney disease, or unspecified chronic kidney disease: Secondary | ICD-10-CM | POA: Insufficient documentation

## 2024-03-26 DIAGNOSIS — E1122 Type 2 diabetes mellitus with diabetic chronic kidney disease: Secondary | ICD-10-CM | POA: Diagnosis not present

## 2024-03-26 DIAGNOSIS — E669 Obesity, unspecified: Secondary | ICD-10-CM | POA: Insufficient documentation

## 2024-03-26 DIAGNOSIS — Z7984 Long term (current) use of oral hypoglycemic drugs: Secondary | ICD-10-CM | POA: Insufficient documentation

## 2024-03-26 DIAGNOSIS — N189 Chronic kidney disease, unspecified: Secondary | ICD-10-CM | POA: Diagnosis not present

## 2024-03-26 DIAGNOSIS — R7989 Other specified abnormal findings of blood chemistry: Secondary | ICD-10-CM

## 2024-03-26 DIAGNOSIS — J449 Chronic obstructive pulmonary disease, unspecified: Secondary | ICD-10-CM | POA: Diagnosis not present

## 2024-03-26 DIAGNOSIS — Z6831 Body mass index (BMI) 31.0-31.9, adult: Secondary | ICD-10-CM | POA: Insufficient documentation

## 2024-03-26 HISTORY — PX: LAPAROSCOPIC LYSIS OF ADHESIONS: SHX5905

## 2024-03-26 HISTORY — PX: LAPAROSCOPY: SHX197

## 2024-03-26 HISTORY — PX: LAPAROSCOPIC ASSISTED VENTRAL HERNIA REPAIR: SHX6312

## 2024-03-26 LAB — GLUCOSE, CAPILLARY
Glucose-Capillary: 176 mg/dL — ABNORMAL HIGH (ref 70–99)
Glucose-Capillary: 155 mg/dL — ABNORMAL HIGH (ref 70–99)

## 2024-03-26 LAB — BASIC METABOLIC PANEL WITH GFR
Anion gap: 9 (ref 5–15)
BUN: 17 mg/dL (ref 8–23)
CO2: 26 mmol/L (ref 22–32)
Calcium: 8.7 mg/dL — ABNORMAL LOW (ref 8.9–10.3)
Chloride: 106 mmol/L (ref 98–111)
Creatinine, Ser: 1.16 mg/dL — ABNORMAL HIGH (ref 0.44–1.00)
GFR, Estimated: 53 mL/min — ABNORMAL LOW (ref >60)
Glucose, Bld: 184 mg/dL — ABNORMAL HIGH (ref 70–99)
Potassium: 4 mmol/L (ref 3.5–5.1)
Sodium: 141 mmol/L (ref 135–145)

## 2024-03-26 SURGERY — LAPAROSCOPY, DIAGNOSTIC
Anesthesia: General

## 2024-03-26 MED ORDER — FENTANYL CITRATE (PF) 250 MCG/5ML IJ SOLN
INTRAMUSCULAR | Status: DC | PRN
  Administered 2024-03-26 (×2): 50 ug via INTRAVENOUS

## 2024-03-26 MED ORDER — LIDOCAINE 2% (20 MG/ML) 5 ML SYRINGE
INTRAMUSCULAR | Status: AC
  Filled 2024-03-26: qty 5

## 2024-03-26 MED ORDER — SODIUM CHLORIDE 0.9 % IV SOLN: INTRAVENOUS | Status: DC | PRN

## 2024-03-26 MED ORDER — 0.9 % SODIUM CHLORIDE (POUR BTL) OPTIME
TOPICAL | Status: DC | PRN
  Administered 2024-03-26: 1000 mL

## 2024-03-26 MED ORDER — MIDAZOLAM HCL 2 MG/2ML IJ SOLN
INTRAMUSCULAR | Status: AC
  Filled 2024-03-26: qty 2

## 2024-03-26 MED ORDER — OXYCODONE HCL 5 MG/5ML PO SOLN: 5.0000 mg | Freq: Once | ORAL | Status: AC | PRN

## 2024-03-26 MED ORDER — PROPOFOL 10 MG/ML IV BOLUS
INTRAVENOUS | Status: AC
  Filled 2024-03-26: qty 20

## 2024-03-26 MED ORDER — FENTANYL CITRATE (PF) 250 MCG/5ML IJ SOLN
INTRAMUSCULAR | Status: AC
  Filled 2024-03-26: qty 5

## 2024-03-26 MED ORDER — BUPIVACAINE HCL 0.25 % IJ SOLN
INTRAMUSCULAR | Status: DC | PRN
  Administered 2024-03-26: 10 mL

## 2024-03-26 MED ORDER — SODIUM CHLORIDE 0.9 % IR SOLN
Status: DC | PRN
Start: 2024-03-26 — End: 2024-03-26

## 2024-03-26 MED ORDER — DEXAMETHASONE SODIUM PHOSPHATE 10 MG/ML IJ SOLN
INTRAMUSCULAR | Status: AC
  Filled 2024-03-26: qty 1

## 2024-03-26 MED ORDER — ACETAMINOPHEN 500 MG PO TABS
1000.0000 mg | ORAL_TABLET | Freq: Once | ORAL | Status: AC
  Administered 2024-03-26: 1000 mg via ORAL
  Filled 2024-03-26: qty 2

## 2024-03-26 MED ORDER — ROCURONIUM BROMIDE 10 MG/ML (PF) SYRINGE
PREFILLED_SYRINGE | INTRAVENOUS | Status: DC | PRN
  Administered 2024-03-26: 10 mg via INTRAVENOUS
  Administered 2024-03-26: 50 mg via INTRAVENOUS

## 2024-03-26 MED ORDER — CEFAZOLIN SODIUM 1 G IJ SOLR
INTRAMUSCULAR | Status: AC
  Filled 2024-03-26: qty 20

## 2024-03-26 MED ORDER — TRAMADOL HCL 50 MG PO TABS: 50.0000 mg | ORAL_TABLET | Freq: Four times a day (QID) | ORAL | 0 refills | Status: AC | PRN

## 2024-03-26 MED ORDER — INSULIN ASPART 100 UNIT/ML IJ SOLN
INTRAMUSCULAR | Status: AC
  Filled 2024-03-26: qty 1

## 2024-03-26 MED ORDER — MIDAZOLAM HCL 2 MG/2ML IJ SOLN
INTRAMUSCULAR | Status: DC | PRN
  Administered 2024-03-26: 2 mg via INTRAVENOUS

## 2024-03-26 MED ORDER — OXYCODONE HCL 5 MG PO TABS
5.0000 mg | ORAL_TABLET | Freq: Once | ORAL | Status: AC | PRN
  Administered 2024-03-26: 5 mg via ORAL

## 2024-03-26 MED ORDER — ONDANSETRON HCL 4 MG/2ML IJ SOLN
INTRAMUSCULAR | Status: AC
  Filled 2024-03-26: qty 2

## 2024-03-26 MED ORDER — CEFAZOLIN SODIUM-DEXTROSE 2-4 GM/100ML-% IV SOLN
2.0000 g | INTRAVENOUS | Status: DC
  Filled 2024-03-26: qty 100

## 2024-03-26 MED ORDER — BUPIVACAINE HCL (PF) 0.25 % IJ SOLN
INTRAMUSCULAR | Status: AC
  Filled 2024-03-26: qty 30

## 2024-03-26 MED ORDER — ROCURONIUM BROMIDE 10 MG/ML (PF) SYRINGE
PREFILLED_SYRINGE | INTRAVENOUS | Status: AC
  Filled 2024-03-26: qty 10

## 2024-03-26 MED ORDER — INSULIN ASPART 100 UNIT/ML IJ SOLN
0.0000 [IU] | INTRAMUSCULAR | Status: DC | PRN
  Administered 2024-03-26: 2 [IU] via SUBCUTANEOUS

## 2024-03-26 MED ORDER — ONDANSETRON HCL 4 MG/2ML IJ SOLN
INTRAMUSCULAR | Status: DC | PRN
  Administered 2024-03-26: 4 mg via INTRAVENOUS

## 2024-03-26 MED ORDER — FENTANYL CITRATE (PF) 100 MCG/2ML IJ SOLN: 25.0000 ug | INTRAMUSCULAR | Status: DC | PRN

## 2024-03-26 MED ORDER — LACTATED RINGERS IV SOLN: INTRAVENOUS | Status: DC

## 2024-03-26 MED ORDER — ONDANSETRON HCL 4 MG/2ML IJ SOLN
4.0000 mg | Freq: Once | INTRAMUSCULAR | Status: AC | PRN
  Administered 2024-03-26: 4 mg via INTRAVENOUS

## 2024-03-26 MED ORDER — CEFAZOLIN SODIUM-DEXTROSE 1-4 GM/50ML-% IV SOLN
INTRAVENOUS | Status: DC | PRN
  Administered 2024-03-26: 2 g via INTRAVENOUS

## 2024-03-26 MED ORDER — PROPOFOL 10 MG/ML IV BOLUS: INTRAVENOUS | Status: DC | PRN

## 2024-03-26 MED ORDER — OXYCODONE HCL 5 MG PO TABS
ORAL_TABLET | ORAL | Status: AC
  Filled 2024-03-26: qty 1

## 2024-03-26 MED ORDER — LIDOCAINE 2% (20 MG/ML) 5 ML SYRINGE
INTRAMUSCULAR | Status: DC | PRN
  Administered 2024-03-26: 100 mg via INTRAVENOUS

## 2024-03-26 MED ORDER — SUGAMMADEX SODIUM 200 MG/2ML IV SOLN
INTRAVENOUS | Status: DC | PRN
  Administered 2024-03-26: 300 mg via INTRAVENOUS

## 2024-03-26 MED ORDER — SODIUM CHLORIDE (PF) 0.9 % IJ SOLN
INTRAMUSCULAR | Status: AC
  Filled 2024-03-26: qty 10

## 2024-03-26 SURGICAL SUPPLY — 42 items
CABLE HIGH FREQUENCY MONO STRZ (ELECTRODE) IMPLANT
CANISTER SUCTION 3000ML PPV (SUCTIONS) IMPLANT
CLIP APPLIE 5 13 M/L LIGAMAX5 (MISCELLANEOUS) IMPLANT
COVER SURGICAL LIGHT HANDLE (MISCELLANEOUS) ×1 IMPLANT
DERMABOND ADVANCED .7 DNX12 (GAUZE/BANDAGES/DRESSINGS) ×1 IMPLANT
DEVICE SECURE STRAP 25 ABSORB (INSTRUMENTS) ×1 IMPLANT
DEVICE TROCAR PUNCTURE CLOSURE (ENDOMECHANICALS) ×1 IMPLANT
DRAPE WARM FLUID 44X44 (DRAPES) ×1 IMPLANT
ELECTRODE REM PT RTRN 9FT ADLT (ELECTROSURGICAL) ×1 IMPLANT
GAUZE SPONGE 2X2 8PLY STRL LF (GAUZE/BANDAGES/DRESSINGS) ×1 IMPLANT
GLOVE BIO SURGEON STRL SZ7.5 (GLOVE) ×2 IMPLANT
GOWN STRL REUS W/ TWL LRG LVL3 (GOWN DISPOSABLE) ×2 IMPLANT
GOWN STRL REUS W/ TWL XL LVL3 (GOWN DISPOSABLE) ×1 IMPLANT
IRRIGATION SUCT STRKRFLW 2 WTP (MISCELLANEOUS) IMPLANT
KIT BASIN OR (CUSTOM PROCEDURE TRAY) ×1 IMPLANT
KIT TURNOVER KIT B (KITS) ×1 IMPLANT
MESH VENTRALIGHT ST 4X6IN (Mesh General) IMPLANT
NDL INSUFFLATION 14GA 120MM (NEEDLE) ×1 IMPLANT
NEEDLE INSUFFLATION 14GA 120MM (NEEDLE) ×1 IMPLANT
NS IRRIG 1000ML POUR BTL (IV SOLUTION) ×1 IMPLANT
PAD ARMBOARD POSITIONER FOAM (MISCELLANEOUS) ×2 IMPLANT
POUCH LAPAROSCOPIC INSTRUMENT (MISCELLANEOUS) ×1 IMPLANT
SCISSORS LAP 5X35 DISP (ENDOMECHANICALS) ×1 IMPLANT
SET TUBE SMOKE EVAC HIGH FLOW (TUBING) ×1 IMPLANT
SHEARS HARMONIC ACE PLUS 36CM (ENDOMECHANICALS) IMPLANT
SLEEVE Z-THREAD 5X100MM (TROCAR) ×1 IMPLANT
SUT CHROMIC 2 0 SH (SUTURE) ×1 IMPLANT
SUT ETHIBOND 0 MO6 C/R (SUTURE) IMPLANT
SUT MNCRL AB 4-0 PS2 18 (SUTURE) ×1 IMPLANT
SUT NOVA 1 T20/GS 25DT (SUTURE) IMPLANT
SUT PROLENE 2 0 KS (SUTURE) IMPLANT
SUT VICRYL 0 UR6 27IN ABS (SUTURE) IMPLANT
TOWEL GREEN STERILE (TOWEL DISPOSABLE) ×1 IMPLANT
TOWEL GREEN STERILE FF (TOWEL DISPOSABLE) ×1 IMPLANT
TRAY FOLEY W/BAG SLVR 16FR ST (SET/KITS/TRAYS/PACK) IMPLANT
TRAY LAPAROSCOPIC MC (CUSTOM PROCEDURE TRAY) ×1 IMPLANT
TROCAR 11X100 Z THREAD (TROCAR) IMPLANT
TROCAR BALLN 12MMX100 BLUNT (TROCAR) IMPLANT
TROCAR Z THREAD OPTICAL 12X100 (TROCAR) IMPLANT
TROCAR Z-THREAD OPTICAL 5X100M (TROCAR) ×1 IMPLANT
WARMER LAPAROSCOPE (MISCELLANEOUS) ×1 IMPLANT
WATER STERILE IRR 1000ML POUR (IV SOLUTION) ×1 IMPLANT

## 2024-03-26 NOTE — Discharge Instructions (Signed)
[facility] _______[facility]  HERNIA REPAIR: POST OP INSTRUCTIONS  Always review your discharge instruction sheet given to you by the facility where your surgery was performed. IF YOU HAVE DISABILITY OR FAMILY LEAVE FORMS, YOU MUST BRING THEM TO THE OFFICE FOR PROCESSING.   DO NOT GIVE THEM TO YOUR DOCTOR.  1. A  prescription for pain medication may be given to you upon discharge.  Take your pain medication as prescribed, if needed.  If narcotic pain medicine is not needed, then you may take acetaminophen (Tylenol) or ibuprofen (Advil) as needed. 2. Take your usually prescribed medications unless otherwise directed. If you need a refill on your pain medication, please contact your pharmacy.  They will contact our office to request authorization. Prescriptions will not be filled after 5 pm or on week-ends. 3. You should follow a light diet the first 24 hours after arrival home, such as soup and crackers, etc.  Be sure to include lots of fluids daily.  Resume your normal diet the day after surgery. 4.Most patients will experience some swelling and bruising around the umbilicus or in the groin and scrotum.  Ice packs and reclining will help.  Swelling and bruising can take several days to resolve.  6. It is common to experience some constipation if taking pain medication after surgery.  Increasing fluid intake and taking a stool softener (such as Colace) will usually help or prevent this problem from occurring.  A mild laxative (Milk of Magnesia or Miralax) should be taken according to package directions if there are no bowel movements after 48 hours. 7. Unless discharge instructions indicate otherwise, you may remove your bandages 24-48 hours after surgery, and you may shower at that time.  You may have steri-strips (small skin tapes) in place directly over the incision.  These strips should be left on the skin for 7-10 days.  If your surgeon used skin glue on the incision, you may shower in  24 hours.  The glue will flake off over the next 2-3 weeks.  Any sutures or staples will be removed at the office during your follow-up visit. 8. ACTIVITIES:  You may resume regular (light) daily activities beginning the next day--such as daily self-care, walking, climbing stairs--gradually increasing activities as tolerated.  You may have sexual intercourse when it is comfortable.  Refrain from any heavy lifting or straining until approved by your doctor.  a.You may drive when you are no longer taking prescription pain medication, you can comfortably wear a seatbelt, and you can safely maneuver your car and apply brakes. b.RETURN TO WORK:   _____________________________________________  9.You should see your doctor in the office for a follow-up appointment approximately 2-3 weeks after your surgery.  Make sure that you call for this appointment within a day or two after you arrive home to insure a convenient appointment time. 10.OTHER INSTRUCTIONS: _________________________    _____________________________________  WHEN TO CALL YOUR DOCTOR: Fever over 101.0 Inability to urinate Nausea and/or vomiting Extreme swelling or bruising Continued bleeding from incision. Increased pain, redness, or drainage from the incision  The clinic staff is available to answer your questions during regular business hours.  Please don't hesitate to call and ask to speak to one of the nurses for clinical concerns.  If you have a medical emergency, go to the nearest emergency room or call 911.  A surgeon from [facility] is always on call at the hospital   [address], [address], [place], [place]    [address] ?  [address] [address], [place], [place]   [address] [phone] ? [phone] ? FAX [phone] Web site: [email or web address]  

## 2024-03-26 NOTE — Op Note (Addendum)
 03/26/2024  8:16 AM  PATIENT:  [name]  63 y.o. female  PRE-OPERATIVE DIAGNOSIS:  2cm Incisional hernia, without obstruction or gangrene  POST-OPERATIVE DIAGNOSIS: 2cm Incisional hernia, without obstruction or gangrene  PROCEDURE:  Procedure(s) with comments: LAPAROSCOPY, DIAGNOSTIC (N/A) LYSIS, ADHESIONS, LAPAROSCOPIC (N/A) REPAIR, HERNIA, VENTRAL, LAPAROSCOPY-ASSISTED (N/A) - LAP VENTRAL HERNIA REPAIR WITH MESH  SURGEON:  Surgeons and Role:    Shela Derby, MD - Primary  ASSISTANTS: Dr. Benna Brasher  ANESTHESIA:   local and general  EBL:  minimal   BLOOD ADMINISTERED:none  DRAINS: none   LOCAL MEDICATIONS USED:  BUPIVICAINE   SPECIMEN:  No Specimen  DISPOSITION OF SPECIMEN:  N/A  COUNTS:  YES  TOURNIQUET:  * No tourniquets in log *  DICTATION: .Dragon Dictation  Findings: 2cm incisional hernai just below the Right subcostal margin.  Transverse colon adhesions to the anterior abdominal wall.  Details of the procedure:  After the patient was consented patient was taken back to the operating room patient was then placed in supine position bilateral SCDs in place.  The patient was prepped and draped in the usual sterile fashion. After antibiotics were confirmed a timeout was called and all facts were verified. The Veress needle technique was used to insuflate the abdomen at Palmer's point. The abdomen was insufflated to 14 mm mercury. Subsequently a 5 mm trocar was placed a camera inserted there was no injury to any intra-abdominal organs.    There was seen to be an non-incarcerated  2cm incisional hernia in the RUQ.  A second camera port was in placed into the left lower quadrant.   At this the Falicform ligament was taken down with Bovie cautery maintaining hemostasis.   The transverse colon and omental adhesions were taken down from the anterior abdominal wall both sharply and bluntly.  I proceeded to reduce the hernia contents.  The hernia sac was  dissected out of the hernia and disposed.  The fascia at the hernia was reapproximated using a #0 Ethibonds x 3.  Once the hernia was cleared away, a Bard Ventralight 15x10cm  mesh was inserted into the abdomen.  The mesh was secured circumferentially with am Securestrap tacker in a double crown fashion.  There were no tacks placed above the costal margin.  The omentum was brought over the area of the mesh. The fascia at the left lower quadrant port site was reapproximated with a 0 vicryl and an endoclose device.  The pneumoperitoneum was evacuated & all trocars  were removed. The skin was reapproximated with 4-0  Monocryl sutures in a subcuticular fashion. The skin was dressed with Dermabond.  The patient was taken to the recovery room in stable condition.  I was personally present during the key and critical portions of this procedure and immediately available throughout the entire procedure, as documented in my operative note.   Type of repair -primary suture & mesh  Mesh overlap - 4cm  Placement of mesh -  beneath fascia and into peritoneal cavity  Size: 3cm, Primary Hernia, and Reducible Hernia   PLAN OF CARE: Discharge to home after PACU  PATIENT DISPOSITION:  PACU - hemodynamically stable.   Delay start of Pharmacological VTE agent (>24hrs) due to surgical blood loss or risk of bleeding: not applicable

## 2024-03-26 NOTE — Anesthesia Postprocedure Evaluation (Signed)
 Anesthesia Post Note  Patient: [name]  Procedure(s) Performed: LAPAROSCOPY, DIAGNOSTIC LYSIS, ADHESIONS, LAPAROSCOPIC REPAIR, HERNIA, VENTRAL, LAPAROSCOPY-ASSISTED     Patient location during evaluation: PACU Anesthesia Type: General Level of consciousness: awake and alert Pain management: pain level controlled Vital Signs Assessment: post-procedure vital signs reviewed and stable Respiratory status: spontaneous breathing, nonlabored ventilation and respiratory function stable Cardiovascular status: stable and blood pressure returned to baseline Anesthetic complications: no   No notable events documented.  Last Vitals:  Vitals:   03/26/24 0900 03/26/24 0912  BP: 132/65 127/65  Pulse: 70 70  Resp: 17 19  Temp:  36.7 C  SpO2: 94% 94%    Last Pain:  Vitals:   03/26/24 0845  TempSrc:   PainSc: 0-No pain                 Juventino Oppenheim

## 2024-03-26 NOTE — Anesthesia Procedure Notes (Signed)
 Procedure Name: Intubation Date/Time: 03/26/2024 7:30 AM  Performed by: Darlena Ego, CRNAPre-anesthesia Checklist: Patient identified, Emergency Drugs available, Suction available and Patient being monitored Patient Re-evaluated:Patient Re-evaluated prior to induction Oxygen  Delivery Method: Circle System Utilized Preoxygenation: Pre-oxygenation with 100% oxygen  Induction Type: IV induction Ventilation: Mask ventilation without difficulty Laryngoscope Size: Mac and 3 Grade View: Grade I Tube type: Oral Tube size: 7.0 mm Number of attempts: 1 Airway Equipment and Method: Stylet and Oral airway Placement Confirmation: ETT inserted through vocal cords under direct vision, positive ETCO2 and breath sounds checked- equal and bilateral Secured at: 21 cm Tube secured with: Tape Dental Injury: Teeth and Oropharynx as per pre-operative assessment

## 2024-03-26 NOTE — Interval H&P Note (Signed)
 History and Physical Interval Note:  03/26/2024 7:16 AM  [name]  has presented today for surgery, with the diagnosis of Incisional hernia, without obstruction or gangrene.  The various methods of treatment have been discussed with the patient and family. After consideration of risks, benefits and other options for treatment, the patient has consented to  Procedure(s) with comments: LAPAROSCOPY, DIAGNOSTIC (N/A) LYSIS, ADHESIONS, LAPAROSCOPIC (N/A) REPAIR, HERNIA, VENTRAL, LAPAROSCOPY-ASSISTED (N/A) - LAP VENTRAL HERNIA REPAIR WITH MESH as a surgical intervention.  The patient's history has been reviewed, patient examined, no change in status, stable for surgery.  I have reviewed the patient's chart and labs.  Questions were answered to the patient's satisfaction.     Daliya Parchment

## 2024-03-26 NOTE — Transfer of Care (Signed)
 Immediate Anesthesia Transfer of Care Note  Patient: [name]  Procedure(s) Performed: LAPAROSCOPY, DIAGNOSTIC LYSIS, ADHESIONS, LAPAROSCOPIC REPAIR, HERNIA, VENTRAL, LAPAROSCOPY-ASSISTED  Patient Location: PACU  Anesthesia Type:General  Level of Consciousness: sedated and responds to stimulation  Airway & Oxygen  Therapy: Patient Spontanous Breathing and Patient connected to nasal cannula oxygen   Post-op Assessment: Report given to RN and Post -op Vital signs reviewed and stable  Post vital signs: Reviewed and stable  Last Vitals:  Vitals Value Taken Time  BP 138/68 03/26/24 08:30  Temp    Pulse 68 03/26/24 08:32  Resp 20 03/26/24 08:32  SpO2 99 % 03/26/24 08:32  Vitals shown include unfiled device data.  Last Pain:  Vitals:   03/26/24 0616  TempSrc:   PainSc: 3          Complications: No notable events documented.

## 2024-03-27 ENCOUNTER — Encounter (HOSPITAL_COMMUNITY): Payer: Self-pay | Admitting: General Surgery

## 2024-04-05 ENCOUNTER — Ambulatory Visit: Admitting: Family

## 2024-04-05 ENCOUNTER — Encounter: Payer: Self-pay | Admitting: Family

## 2024-04-05 ENCOUNTER — Ambulatory Visit

## 2024-04-05 VITALS — BP 124/62 | HR 73 | Temp 98.1°F | Ht 66.0 in | Wt 200.8 lb

## 2024-04-05 VITALS — Ht 66.0 in | Wt 200.0 lb

## 2024-04-05 DIAGNOSIS — N3090 Cystitis, unspecified without hematuria: Secondary | ICD-10-CM

## 2024-04-05 DIAGNOSIS — Z0000 Encounter for general adult medical examination without abnormal findings: Secondary | ICD-10-CM

## 2024-04-05 DIAGNOSIS — Z789 Other specified health status: Secondary | ICD-10-CM | POA: Insufficient documentation

## 2024-04-05 DIAGNOSIS — R350 Frequency of micturition: Secondary | ICD-10-CM | POA: Diagnosis not present

## 2024-04-05 DIAGNOSIS — R269 Unspecified abnormalities of gait and mobility: Secondary | ICD-10-CM | POA: Insufficient documentation

## 2024-04-05 DIAGNOSIS — Z7409 Other reduced mobility: Secondary | ICD-10-CM | POA: Insufficient documentation

## 2024-04-05 LAB — POC URINALSYSI DIPSTICK (AUTOMATED)
Bilirubin, UA: NEGATIVE
Blood, UA: POSITIVE
Glucose, UA: NEGATIVE
Ketones, UA: NEGATIVE
Nitrite, UA: NEGATIVE
Protein, UA: NEGATIVE
Spec Grav, UA: 1.015 (ref 1.010–1.025)
Urobilinogen, UA: 0.2 U/dL
pH, UA: 6 (ref 5.0–8.0)

## 2024-04-05 MED ORDER — SULFAMETHOXAZOLE-TRIMETHOPRIM 800-160 MG PO TABS: 1.0000 | ORAL_TABLET | Freq: Two times a day (BID) | ORAL | 0 refills | Status: DC

## 2024-04-05 NOTE — Progress Notes (Signed)
 Patient ID: [name], female    DOB: 1961/05/28, 63 y.o.   MRN: [id number]  Chief Complaint  Patient presents with   Urinary Tract Infection    Pt in office for possible UTI; pt saying prev visit seen same thing; meds helped but now having symptoms again; flank pain; frequency and urgency hasn't been bad; Pt also admits burning during urination;   Discussed the use of AI scribe software for clinical note transcription with the patient, who gave verbal consent to proceed.  History of Present Illness [name] is a 63 year old female with recurrent urinary tract infections who presents with symptoms of another infection.  She experiences symptoms of a urinary tract infection after completing a five-day course of Augmentin . A previous culture indicated no need to switch antibiotics. Her current symptoms are less severe than the last episode, which occurred approximately two weeks ago. She had no fever during her last infection, but did have an elevated WBC. She denies current diarrhea but experienced it on Monday. She questions whether restarting Mounjaro  could be causing diarrhea but notes it occurred even when taking the medication previously. She does not use over-the-counter medications like Azo for her symptoms. She has diabetes. She recalls an allergic reaction to clindamycin  when treated for MRSA in her foot. She feels very tired and notes low hemoglobin in a recent blood test. Her red blood cell count is usually low, and her white blood cell count is typically high, which she associates with infection.  Assessment & Plan Recurrent Urinary Tract Infection (UTI) Recurrent UTI, current episode less severe. Previous treatment with Augmentin , culture did not require change. Recent diarrhea likely due to Augmentin . Bactrim  chosen to minimize diarrhea risk, no known allergies, expected effective. Small leuks found today. - Prescribe Bactrim  for UTI treatment x 3d. - Send urine  culture. - Advise hydration, aim for two liters of water  daily. - Monitor for diarrhea, report if occurs, reminded on proper hygiene. - Call back if symptoms are not resolving after finishing abt.  Mild Anemia Fatigue reported, low hemoglobin (10.8 g/dL) on recent lab suggests anemia. Iron  supplementation recommended, B12 considered for energy. - Recommend over-the-counter low dose slow-release iron  supplement (25-45 mg). - Consider OTC B12 supplement for energy.   Subjective:    Outpatient Medications Prior to Visit  Medication Sig Dispense Refill   albuterol  (PROVENTIL ) (2.5 MG/3ML) 0.083% nebulizer solution Take 3 mLs (2.5 mg total) by nebulization every 6 (six) hours as needed for wheezing or shortness of breath (Use 2-3 times per day when sick.). 75 mL 2   albuterol  (VENTOLIN  HFA) 108 (90 Base) MCG/ACT inhaler Inhale 1-2 puffs into the lungs every 4 (four) hours as needed for wheezing or shortness of breath. 8.5 each 1   aspirin  EC 81 MG tablet Take 1 tablet (81 mg total) by mouth daily. 30 tablet 11   atorvastatin  (LIPITOR) 40 MG tablet Take 1 tablet (40 mg total) by mouth daily. 90 tablet 3   benzonatate  (TESSALON  PERLES) 100 MG capsule Take 1-2 capsules (100-200 mg total) by mouth 3 (three) times daily as needed for cough. 30 capsule 3   BREO ELLIPTA  200-25 MCG/INH AEPB TAKE 1 PUFF BY MOUTH EVERY DAY 60 each 5   buPROPion  (WELLBUTRIN  XL) 300 MG 24 hr tablet TAKE 1 TABLET BY MOUTH EVERY DAY 90 tablet 3   Calcium  Carbonate (CALTRATE 600 PO) Orally Once a day for 30 day(s)     chlorpheniramine -HYDROcodone  (TUSSIONEX) 10-8 MG/5ML Take  5 mLs by mouth at bedtime as needed for cough (do not drive for 8 hours after taking). 115 mL 0   Continuous Glucose Sensor (DEXCOM G7 SENSOR) MISC 1 DEVICE BY DOES NOT APPLY ROUTE AS DIRECTED. CHANGE SENSOR EVERY 10 DAYS 3 each 3   cyclobenzaprine  (FLEXERIL ) 5 MG tablet Take 1 tablet (5 mg total) by mouth 3 (three) times daily as needed for muscle spasms  (or Headache). 30 tablet 0   diltiazem  (CARDIZEM  CD) 120 MG 24 hr capsule TAKE 1 CAPSULE BY MOUTH EVERY DAY 90 capsule 3   estradiol  (ESTRACE ) 0.1 MG/GM vaginal cream Place 0.5 Applicatorfuls vaginally daily as needed (irritation).     famotidine  (PEPCID ) 20 MG tablet TAKE 1 TABLET BY MOUTH TWICE A DAY 180 tablet 1   fenofibrate  160 MG tablet TAKE 1 TABLET BY MOUTH EVERY DAY 90 tablet 1   fluticasone  (FLONASE ) 50 MCG/ACT nasal spray Place 2 sprays into both nostrils daily as needed for allergies. 48 mL 3   furosemide  (LASIX ) 40 MG tablet Take 1 tablet (40 mg total) by mouth daily. 90 tablet 3   gabapentin  (NEURONTIN ) 100 MG capsule TAKE 1 CAPSULE BY MOUTH THREE TIMES A DAY 270 capsule 3   guaiFENesin -dextromethorphan  (ROBITUSSIN DM) 100-10 MG/5ML syrup Take 5 mLs by mouth every 4 (four) hours as needed for cough (chest congestion). 118 mL 0   Insulin  Pen Needle (PEN NEEDLES) 32G X 4 MM MISC 1 each by Does not apply route 4 (four) times daily. E11.9 400 each 0   ipratropium (ATROVENT ) 0.06 % nasal spray PLACE 2 SPRAYS INTO BOTH NOSTRILS 4 TIMES DAILY. 45 mL 3   KLOR-CON  M20 20 MEQ tablet TAKE 1 & 1/2 TABLETS BY MOUTH TWICE A DAY 270 tablet 2   LANTUS  SOLOSTAR 100 UNIT/ML Solostar Pen Inject 24 Units into the skin daily.     metFORMIN  (GLUCOPHAGE -XR) 500 MG 24 hr tablet TAKE 4 TABLETS BY MOUTH DAILY WITH BREAKFAST. 360 tablet 0   ondansetron  (ZOFRAN -ODT) 4 MG disintegrating tablet Take 1 tablet (4 mg total) by mouth every 8 (eight) hours as needed for nausea or vomiting. 20 tablet 0   pantoprazole  (PROTONIX ) 40 MG tablet TAKE 1 TABLET BY MOUTH TWICE A DAY 180 tablet 1   telmisartan  (MICARDIS ) 40 MG tablet TAKE 1 TABLET BY MOUTH EVERY DAY 90 tablet 3   tirzepatide  (MOUNJARO ) 7.5 MG/0.5ML Pen Inject 7.5 mg into the skin once a week. 6 mL 0   traMADol  (ULTRAM ) 50 MG tablet Take 1 tablet (50 mg total) by mouth every 6 (six) hours as needed. 20 tablet 0   venlafaxine  XR (EFFEXOR -XR) 75 MG 24 hr capsule  TAKE 1 CAPSULE BY MOUTH TWICE A DAY 180 capsule 3   amoxicillin -clavulanate (AUGMENTIN ) 875-125 MG tablet Take 1 tablet by mouth 2 (two) times daily. (Patient not taking: Reported on 04/05/2024) 20 tablet 0   glucose blood (CONTOUR NEXT TEST) test strip 1 each by Other route 4 (four) times daily. And lancets 4/day (Patient not taking: Reported on 04/05/2024) 400 each 3   Lancet Devices (EASY MINI EJECT LANCING DEVICE) MISC Use as directed 4 times daily to test blood sugar (Patient not taking: Reported on 04/05/2024) 1 each 3   Microlet Lancets MISC 1 each by Does not apply route 2 (two) times daily. E11.9 (Patient not taking: Reported on 04/05/2024) 100 each 2   No facility-administered medications prior to visit.   Past Medical History:  Diagnosis Date   Abnormal SPEP 04/17/2014  Acute left ankle pain 01/26/2017   Allergy    ANEMIA-UNSPECIFIED 09/18/2009   Anxiety    Arthritis    Blood transfusion without reported diagnosis    Cataract    CHF (congestive heart failure) (HCC)    Chronic diastolic heart failure, NYHA class 2 (HCC)    Normal LVEDP by May 2018   COPD (chronic obstructive pulmonary disease) (HCC)    Depression    DIABETES MELLITUS, TYPE II 08/21/2006   Diabetic osteomyelitis (HCC) 05/29/2015   Fatty liver    Fracture of 5th metatarsal    non union   GERD 08/21/2006   Had fundoplication   GOUT 08/20/2010   Hammer toe of right foot    2-5th toes   Hx of umbilical hernia repair    HYPERLIPIDEMIA 08/21/2006   HYPERTENSION 08/21/2006   Infection of prosthetic left knee joint (HCC) 08/29/2020   Infection of wound due to methicillin resistant Staphylococcus aureus (MRSA)    Internal hemorrhoids    Kidney problem    Multiple allergies 10/14/2016   OBESITY 06/04/2009   Onychomycosis 10/27/2015   Osteomyelitis of left foot (HCC) 05/29/2015   Osteoporosis    Pulmonary sarcoidosis (HCC)    Followed locally by pulmonology, but also by Dr. Lavella at [facility]    Right knee pain 01/26/2017   Vocal cord dysfunction    Wears partial dentures    Past Surgical History:  Procedure Laterality Date   ABDOMINAL HYSTERECTOMY     APPENDECTOMY     BLADDER SUSPENSION  11/11/2011   Procedure: TRANSVAGINAL TAPE (TVT) PROCEDURE;  Surgeon: Oneil FORBES Piety, MD;  Location: [facility];  Service: Gynecology;  Laterality: N/A;   CAROTIDS  02/18/2011   CAROTID DUPLEX; VERTEBRALS ARE PATENT WITH ANTEGRADE FLOW. ICA/CCA RATIO 1.61 ON RIGHT AND 0.75 ON LEFT   CATARACT EXTRACTION, BILATERAL     CHOLECYSTECTOMY  1984   COLONOSCOPY  04/29/2010   [facility]   CYSTOSCOPY  11/11/2011   Procedure: CYSTOSCOPY;  Surgeon: Oneil FORBES Piety, MD;  Location: [facility];  Service: Gynecology;  Laterality: N/A;   EXTUBATION (ENDOTRACHEAL) IN OR N/A 09/23/2017   Procedure: EXTUBATION (ENDOTRACHEAL) IN OR;  Surgeon: Terri Alan PARAS, MD;  Location: [facility];  Service: ENT;  Laterality: N/A;   FIBEROPTIC LARYNGOSCOPY AND TRACHEOSCOPY N/A 09/23/2017   Procedure: FLEXIBLE FIBEROPTIC LARYNGOSCOPY;  Surgeon: Terri Alan PARAS, MD;  Location: [facility];  Service: ENT;  Laterality: N/A;   FRACTURE SURGERY     foot   HALLUX FUSION Left 10/02/2018   Procedure: HALLUX ARTHRODESIS;  Surgeon: Janit Thresa HERO, DPM;  Location: [facility];  Service: Podiatry;  Laterality: Left;   HAMMER TOE SURGERY Left 10/02/2018   Procedure: HAMMER TOE CORRECTION 2ND 3RD 4RD FIFTH TOE;  Surgeon: Janit Thresa HERO, DPM;  Location: [facility];  Service: Podiatry;  Laterality: Left;   HAMMER TOE SURGERY Right 04/12/2019   Procedure: HAMMER TOE CORRECTION, 2ND, 3RD, 4TH AND 5TH TOES OF RIGHT FOOT;  Surgeon: Janit Thresa HERO, DPM;  Location: [facility];  Service: Podiatry;  Laterality: Right;   HAMMER TOE SURGERY Left 10/25/2019   Procedure: HAMMER TOE REPAIR SECOND, THIRD, FOUTH;  Surgeon: Janit Thresa HERO, DPM;  Location: [facility];  Service: Podiatry;  Laterality: Left;   HERNIA REPAIR     I & D EXTREMITY Left 06/27/2015   Procedure: Partial Excision  Left Calcaneus, Place Antibiotic Beads, and Wound VAC;  Surgeon: Jerona Harden GAILS, MD;  Location: [facility];  Service: Orthopedics;  Laterality: Left;   JOINT REPLACEMENT     KNEE ARTHROSCOPY     right   LAPAROSCOPIC ASSISTED VENTRAL HERNIA REPAIR N/A 03/26/2024   Procedure: REPAIR, HERNIA, VENTRAL, LAPAROSCOPY-ASSISTED;  Surgeon: Rubin Calamity, MD;  Location: [facility];  Service: General;  Laterality: N/A;  LAP VENTRAL HERNIA REPAIR WITH MESH   LAPAROSCOPIC LYSIS OF ADHESIONS N/A 03/26/2024   Procedure: LYSIS, ADHESIONS, LAPAROSCOPIC;  Surgeon: Rubin Calamity, MD;  Location: [facility] OR;  Service: General;  Laterality: N/A;   LAPAROSCOPY N/A 03/26/2024   Procedure: LAPAROSCOPY, DIAGNOSTIC;  Surgeon: Rubin Calamity, MD;  Location: [facility] OR;  Service: General;  Laterality: N/A;   LEFT AND RIGHT HEART CATHETERIZATION WITH CORONARY ANGIOGRAM N/A 04/23/2013   Procedure: LEFT AND RIGHT HEART CATHETERIZATION WITH CORONARY ANGIOGRAM;  Surgeon: Alm LELON Clay, MD;  Location: [facility] CATH LAB;  Service: Cardiovascular;  Laterality: N/A;   LEFT HEART CATH AND CORONARY ANGIOGRAPHY N/A 03/11/2017   Procedure: Left Heart Cath and Coronary Angiography;  Surgeon: Wonda Sharper, MD;  Location: [facility] INVASIVE CV LAB; angiographically minimal CAD in the LAD otherwise normal.;  Normal LVEDP.  FALSE POSITIVE MYOVIEW    LEFT HEART CATH AND CORONARY ANGIOGRAPHY  07/20/2010   LVEF 50-55% WITH VERY MILD GLOBAL HYPOKINESIA; ESSENTIALLY NORMAL CORONARY ARTERIES; NORMAL LV FUNCTION   METATARSAL OSTEOTOMY WITH OPEN REDUCTION INTERNAL FIXATION (ORIF) METATARSAL WITH FUSION Left 04/09/2014   Procedure: LEFT FOOT FRACTURE OPEN TREATMENT METATARSAL INCLUDES INTERNAL FIXATION EACH;  Surgeon: Lamar DELENA Millman, MD;  Location: [facility];  Service: Orthopedics;  Laterality: Left;   NISSEN FUNDOPLICATION  2004   NM MYOVIEW  LTD --> FALSE POSITIVE  03/10/2017   : Moderate size stress-induced perfusion defect at the apex as well as  ill-defined stress-induced perfusion defect in the lateral wall.  EF 55%.  INTERMEDIATE risk. -->  FALSE POSITIVE   ORIF DISTAL FEMUR FRACTURE  08/08/2020   [facility]: ORIF of left extra-articular periprosthetic distal femur fracture with synthesis of the femur plate with cortical nonlocking screws.  (Thought to be pathologic fracture due to osteoporosis) -> after a fall   ORIF FEMUR W/ PERI-IMPLANT  08/29/2020   [facility]) revision of left femur fracture ORIF plate fixation screws; culture positive for MSSA   Right and left CARDIAC CATHETERIZATION  04/23/2013   Angiographic normal coronaries; LVEDP 20 mmHg, PCWP 12-14 mmHg, RAP 12 mmHg.; Fick CO/CI 4.9/2.2   ROTATOR CUFF REPAIR Right 12/09/2021   SHOULDER ARTHROSCOPY Left 03/14/2019   Procedure: LEFT SHOULDER ARTHROSCOPY, DEBRIDEMENT, AND DECOMPRESSION;  Surgeon: Harden Jerona GAILS, MD;  Location: [facility];  Service: Orthopedics;  Laterality: Left;   TOTAL KNEE ARTHROPLASTY Right 06/29/2017   Procedure: RIGHT TOTAL KNEE ARTHROPLASTY;  Surgeon: Harden Jerona GAILS, MD;  Location: [facility] OR;  Service: Orthopedics;  Laterality: Right;   TOTAL KNEE ARTHROPLASTY Left 12/07/2017   TOTAL KNEE ARTHROPLASTY Left 12/07/2017   Procedure: LEFT TOTAL KNEE ARTHROPLASTY;  Surgeon: Harden Jerona GAILS, MD;  Location: [facility] OR;  Service: Orthopedics;  Laterality: Left;   TRACHEOSTOMY TUBE PLACEMENT N/A 09/20/2017   Procedure: AWAKE INTUBATION WITH ANESTHESIA WITH VIDEO ASSISTANCE;  Surgeon: Terri Alan PARAS, MD;  Location: [facility];  Service: ENT;  Laterality: N/A;   TRANSTHORACIC ECHOCARDIOGRAM  08/2014   Normal LV size and function.  Mild LVH.  EF 55-60%.  Normal regional wall motion.  GR 1 DD.  Normal RV size and function .   TRANSTHORACIC ECHOCARDIOGRAM  08/12/2020    [facility]): Normal  LV size and function.  EF 55 to 60%.  No RWM A.  Normal filling pattern.  Normal RV.  No significant valvular disease-stenosis or regurgitation.  No  vegetation.   TUBAL LIGATION     with reversal in 1994   UPPER GASTROINTESTINAL ENDOSCOPY     VENTRAL HERNIA REPAIR     Allergies  Allergen Reactions   Firvanq  [Vancomycin ] Other (See Comments)    Dose related nephrotoxicity    Methotrexate Other (See Comments)    Peri-oral and buccal lesions   Lisinopril  Cough   Chlorhexidine  Itching   Cleocin  [Clindamycin ] Nausea And Vomiting and Rash   Lincocin [Lincomycin Hcl] Nausea And Vomiting and Rash   Teflaro [Ceftaroline] Rash and Other (See Comments)    Tolerates ceftriaxone        Objective:    Physical Exam Vitals and nursing note reviewed.  Constitutional:      Appearance: Normal appearance.   Cardiovascular:     Rate and Rhythm: Normal rate and regular rhythm.  Pulmonary:     Effort: Pulmonary effort is normal.     Breath sounds: Normal breath sounds.   Musculoskeletal:        General: Normal range of motion.   Skin:    General: Skin is warm and dry.   Neurological:     Mental Status: She is alert.   Psychiatric:        Mood and Affect: Mood normal.        Behavior: Behavior normal.    BP 124/62 (BP Location: Left Arm, Patient Position: Sitting, Cuff Size: Normal)   Pulse 73   Temp 98.1 F (36.7 C) (Temporal)   Ht 5' 6 (1.676 m)   Wt 200 lb 12.8 oz (91.1 kg)   SpO2 94%   BMI 32.41 kg/m  Wt Readings from Last 3 Encounters:  04/05/24 200 lb 12.8 oz (91.1 kg)  03/26/24 198 lb (89.8 kg)  03/20/24 198 lb 6.1 oz (90 kg)       Lucius Krabbe, NP

## 2024-04-05 NOTE — Progress Notes (Addendum)
 Subjective:   [name] is a 63 y.o. who presents for a Medicare Wellness preventive visit.  As a reminder, Annual Wellness Visits don't include a physical exam, and some assessments may be limited, especially if this visit is performed virtually. We may recommend an in-person follow-up visit with your provider if needed.  Visit Complete: Virtual I connected with  [name] on 04/05/24 by a audio enabled telemedicine application and verified that I am speaking with the correct person using two identifiers.  Patient Location: Home  Provider Location: Office/Clinic  I discussed the limitations of evaluation and management by telemedicine. The patient expressed understanding and agreed to proceed.  Vital Signs: Because this visit was a virtual/telehealth visit, some criteria may be missing or patient reported. Any vitals not documented were not able to be obtained and vitals that have been documented are patient reported.  VideoDeclined- This patient declined Librarian, academic. Therefore the visit was completed with audio only.  Persons Participating in Visit: Patient.  AWV Questionnaire: Yes: Patient Medicare AWV questionnaire was completed by the patient on 04/03/24; I have confirmed that all information answered by patient is correct and no changes since this date.  Cardiac Risk Factors include: advanced age (>75men, >65 women);diabetes mellitus;dyslipidemia;hypertension;obesity (BMI >30kg/m2)     Objective:    Today's Vitals   04/05/24 1455  Weight: 200 lb (90.7 kg)  Height: 5' 6 (1.676 m)   Body mass index is 32.28 kg/m.     04/05/2024    3:00 PM 03/26/2024    6:03 AM 03/14/2024    9:18 AM 02/01/2024    5:12 PM 01/15/2024    3:15 PM 08/09/2023    8:39 AM 03/15/2023   11:34 AM  Advanced Directives  Does Patient Have a Medical Advance Directive? No No No No No No No  Would patient like information on creating a medical advance  directive? No - Patient declined No - Patient declined No - Patient declined   No - Patient declined No - Patient declined    Current Medications (verified) Outpatient Encounter Medications as of 04/05/2024  Medication Sig   albuterol  (PROVENTIL ) (2.5 MG/3ML) 0.083% nebulizer solution Take 3 mLs (2.5 mg total) by nebulization every 6 (six) hours as needed for wheezing or shortness of breath (Use 2-3 times per day when sick.).   albuterol  (VENTOLIN  HFA) 108 (90 Base) MCG/ACT inhaler Inhale 1-2 puffs into the lungs every 4 (four) hours as needed for wheezing or shortness of breath.   aspirin  EC 81 MG tablet Take 1 tablet (81 mg total) by mouth daily.   atorvastatin  (LIPITOR) 40 MG tablet Take 1 tablet (40 mg total) by mouth daily.   benzonatate  (TESSALON  PERLES) 100 MG capsule Take 1-2 capsules (100-200 mg total) by mouth 3 (three) times daily as needed for cough.   BREO ELLIPTA  200-25 MCG/INH AEPB TAKE 1 PUFF BY MOUTH EVERY DAY   buPROPion  (WELLBUTRIN  XL) 300 MG 24 hr tablet TAKE 1 TABLET BY MOUTH EVERY DAY   Calcium  Carbonate (CALTRATE 600 PO) Orally Once a day for 30 day(s)   chlorpheniramine -HYDROcodone  (TUSSIONEX) 10-8 MG/5ML Take 5 mLs by mouth at bedtime as needed for cough (do not drive for 8 hours after taking).   Continuous Glucose Sensor (DEXCOM G7 SENSOR) MISC 1 DEVICE BY DOES NOT APPLY ROUTE AS DIRECTED. CHANGE SENSOR EVERY 10 DAYS   cyclobenzaprine  (FLEXERIL ) 5 MG tablet Take 1 tablet (5 mg total) by mouth 3 (three) times daily as  needed for muscle spasms (or Headache).   diltiazem  (CARDIZEM  CD) 120 MG 24 hr capsule TAKE 1 CAPSULE BY MOUTH EVERY DAY   estradiol  (ESTRACE ) 0.1 MG/GM vaginal cream Place 0.5 Applicatorfuls vaginally daily as needed (irritation).   famotidine  (PEPCID ) 20 MG tablet TAKE 1 TABLET BY MOUTH TWICE A DAY   fenofibrate  160 MG tablet TAKE 1 TABLET BY MOUTH EVERY DAY   fluticasone  (FLONASE ) 50 MCG/ACT nasal spray Place 2 sprays into both nostrils daily as needed  for allergies.   furosemide  (LASIX ) 40 MG tablet Take 1 tablet (40 mg total) by mouth daily.   gabapentin  (NEURONTIN ) 100 MG capsule TAKE 1 CAPSULE BY MOUTH THREE TIMES A DAY   glucose blood (CONTOUR NEXT TEST) test strip 1 each by Other route 4 (four) times daily. And lancets 4/day   guaiFENesin -dextromethorphan  (ROBITUSSIN DM) 100-10 MG/5ML syrup Take 5 mLs by mouth every 4 (four) hours as needed for cough (chest congestion).   Insulin  Pen Needle (PEN NEEDLES) 32G X 4 MM MISC 1 each by Does not apply route 4 (four) times daily. E11.9   ipratropium (ATROVENT ) 0.06 % nasal spray PLACE 2 SPRAYS INTO BOTH NOSTRILS 4 TIMES DAILY.   KLOR-CON  M20 20 MEQ tablet TAKE 1 & 1/2 TABLETS BY MOUTH TWICE A DAY   Lancet Devices (EASY MINI EJECT LANCING DEVICE) MISC Use as directed 4 times daily to test blood sugar   LANTUS  SOLOSTAR 100 UNIT/ML Solostar Pen Inject 24 Units into the skin daily.   metFORMIN  (GLUCOPHAGE -XR) 500 MG 24 hr tablet TAKE 4 TABLETS BY MOUTH DAILY WITH BREAKFAST.   Microlet Lancets MISC 1 each by Does not apply route 2 (two) times daily. E11.9   ondansetron  (ZOFRAN -ODT) 4 MG disintegrating tablet Take 1 tablet (4 mg total) by mouth every 8 (eight) hours as needed for nausea or vomiting.   pantoprazole  (PROTONIX ) 40 MG tablet TAKE 1 TABLET BY MOUTH TWICE A DAY   sulfamethoxazole -trimethoprim  (BACTRIM  DS) 800-160 MG tablet Take 1 tablet by mouth 2 (two) times daily after a meal.   telmisartan  (MICARDIS ) 40 MG tablet TAKE 1 TABLET BY MOUTH EVERY DAY   tirzepatide  (MOUNJARO ) 7.5 MG/0.5ML Pen Inject 7.5 mg into the skin once a week.   traMADol  (ULTRAM ) 50 MG tablet Take 1 tablet (50 mg total) by mouth every 6 (six) hours as needed.   venlafaxine  XR (EFFEXOR -XR) 75 MG 24 hr capsule TAKE 1 CAPSULE BY MOUTH TWICE A DAY   [DISCONTINUED] mupirocin  nasal ointment (BACTROBAN ) 2 % Place 1 application into the nose 2 (two) times daily. Use one-half of tube in each nostril twice daily for five (5) days.  After application, press sides of nose together and gently massage.   No facility-administered encounter medications on file as of 04/05/2024.    Allergies (verified) Firvanq  [vancomycin ], Methotrexate, Lisinopril , Chlorhexidine , Cleocin  [clindamycin ], Lincocin [lincomycin hcl], and Teflaro [ceftaroline]   History: Past Medical History:  Diagnosis Date   Abnormal SPEP 04/17/2014   Acute left ankle pain 01/26/2017   Allergy    ANEMIA-UNSPECIFIED 09/18/2009   Anxiety    Arthritis    Blood transfusion without reported diagnosis    Cataract    CHF (congestive heart failure) (HCC)    Chronic diastolic heart failure, NYHA class 2 (HCC)    Normal LVEDP by May 2018   COPD (chronic obstructive pulmonary disease) (HCC)    Depression    DIABETES MELLITUS, TYPE II 08/21/2006   Diabetic osteomyelitis (HCC) 05/29/2015   Fatty liver  Fracture of 5th metatarsal    non union   GERD 08/21/2006   Had fundoplication   GOUT 08/20/2010   Hammer toe of right foot    2-5th toes   Hx of umbilical hernia repair    HYPERLIPIDEMIA 08/21/2006   HYPERTENSION 08/21/2006   Infection of prosthetic left knee joint (HCC) 08/29/2020   Infection of wound due to methicillin resistant Staphylococcus aureus (MRSA)    Internal hemorrhoids    Kidney problem    Multiple allergies 10/14/2016   OBESITY 06/04/2009   Onychomycosis 10/27/2015   Osteomyelitis of left foot (HCC) 05/29/2015   Osteoporosis    Pulmonary sarcoidosis (HCC)    Followed locally by pulmonology, but also by Dr. Lavella at [facility]   Right knee pain 01/26/2017   Vocal cord dysfunction    Wears partial dentures    Past Surgical History:  Procedure Laterality Date   ABDOMINAL HYSTERECTOMY     APPENDECTOMY     BLADDER SUSPENSION  11/11/2011   Procedure: TRANSVAGINAL TAPE (TVT) PROCEDURE;  Surgeon: Oneil FORBES Piety, MD;  Location: [facility];  Service: Gynecology;  Laterality: N/A;   CAROTIDS  02/18/2011   CAROTID DUPLEX;  VERTEBRALS ARE PATENT WITH ANTEGRADE FLOW. ICA/CCA RATIO 1.61 ON RIGHT AND 0.75 ON LEFT   CATARACT EXTRACTION, BILATERAL     CHOLECYSTECTOMY  1984   COLONOSCOPY  04/29/2010   [facility]   CYSTOSCOPY  11/11/2011   Procedure: CYSTOSCOPY;  Surgeon: Oneil FORBES Piety, MD;  Location: [facility];  Service: Gynecology;  Laterality: N/A;   EXTUBATION (ENDOTRACHEAL) IN OR N/A 09/23/2017   Procedure: EXTUBATION (ENDOTRACHEAL) IN OR;  Surgeon: Terri Alan PARAS, MD;  Location: [facility];  Service: ENT;  Laterality: N/A;   FIBEROPTIC LARYNGOSCOPY AND TRACHEOSCOPY N/A 09/23/2017   Procedure: FLEXIBLE FIBEROPTIC LARYNGOSCOPY;  Surgeon: Terri Alan PARAS, MD;  Location: [facility];  Service: ENT;  Laterality: N/A;   FRACTURE SURGERY     foot   HALLUX FUSION Left 10/02/2018   Procedure: HALLUX ARTHRODESIS;  Surgeon: Janit Thresa HERO, DPM;  Location: [facility];  Service: Podiatry;  Laterality: Left;   HAMMER TOE SURGERY Left 10/02/2018   Procedure: HAMMER TOE CORRECTION 2ND 3RD 4RD FIFTH TOE;  Surgeon: Janit Thresa HERO, DPM;  Location: [facility];  Service: Podiatry;  Laterality: Left;   HAMMER TOE SURGERY Right 04/12/2019   Procedure: HAMMER TOE CORRECTION, 2ND, 3RD, 4TH AND 5TH TOES OF RIGHT FOOT;  Surgeon: Janit Thresa HERO, DPM;  Location[facility];  Service: Podiatry;  Laterality: Right;   HAMMER TOE SURGERY Left 10/25/2019   Procedure: HAMMER TOE REPAIR SECOND, THIRD, FOUTH;  Surgeon: Janit Thresa HERO, DPM;  Location: [facility];  Service: Podiatry;  Laterality: Left;   HERNIA REPAIR     I & D EXTREMITY Left 06/27/2015   Procedure: Partial Excision Left Calcaneus, Place Antibiotic Beads, and Wound VAC;  Surgeon: Jerona Harden GAILS, MD;  Location: [facility];  Service: Orthopedics;  Laterality: Left;   JOINT REPLACEMENT     KNEE ARTHROSCOPY     right   LAPAROSCOPIC ASSISTED VENTRAL HERNIA REPAIR N/A 03/26/2024   Procedure: REPAIR, HERNIA, VENTRAL, LAPAROSCOPY-ASSISTED;  Surgeon: Rubin Calamity, MD;  Location: [facility] OR;  Service: General;   Laterality: N/A;  LAP VENTRAL HERNIA REPAIR WITH MESH   LAPAROSCOPIC LYSIS OF ADHESIONS N/A 03/26/2024   Procedure: LYSIS, ADHESIONS, LAPAROSCOPIC;  Surgeon: Rubin Calamity, MD;  Location: [facility] OR;  Service: General;  Laterality: N/A;   LAPAROSCOPY N/A 03/26/2024   Procedure:  LAPAROSCOPY, DIAGNOSTIC;  Surgeon: Rubin Calamity, MD;  Location: [facility] OR;  Service: General;  Laterality: N/A;   LEFT AND RIGHT HEART CATHETERIZATION WITH CORONARY ANGIOGRAM N/A 04/23/2013   Procedure: LEFT AND RIGHT HEART CATHETERIZATION WITH CORONARY ANGIOGRAM;  Surgeon: Alm LELON Clay, MD;  Location: [facility] CATH LAB;  Service: Cardiovascular;  Laterality: N/A;   LEFT HEART CATH AND CORONARY ANGIOGRAPHY N/A 03/11/2017   Procedure: Left Heart Cath and Coronary Angiography;  Surgeon: Wonda Sharper, MD;  Location: [facility] INVASIVE CV LAB; angiographically minimal CAD in the LAD otherwise normal.;  Normal LVEDP.  FALSE POSITIVE MYOVIEW    LEFT HEART CATH AND CORONARY ANGIOGRAPHY  07/20/2010   LVEF 50-55% WITH VERY MILD GLOBAL HYPOKINESIA; ESSENTIALLY NORMAL CORONARY ARTERIES; NORMAL LV FUNCTION   METATARSAL OSTEOTOMY WITH OPEN REDUCTION INTERNAL FIXATION (ORIF) METATARSAL WITH FUSION Left 04/09/2014   Procedure: LEFT FOOT FRACTURE OPEN TREATMENT METATARSAL INCLUDES INTERNAL FIXATION EACH;  Surgeon: Lamar DELENA Millman, MD;  Location: [facility];  Service: Orthopedics;  Laterality: Left;   NISSEN FUNDOPLICATION  2004   NM MYOVIEW  LTD --> FALSE POSITIVE  03/10/2017   : Moderate size stress-induced perfusion defect at the apex as well as ill-defined stress-induced perfusion defect in the lateral wall.  EF 55%.  INTERMEDIATE risk. -->  FALSE POSITIVE   ORIF DISTAL FEMUR FRACTURE  08/08/2020   [facility] [place]: ORIF of left extra-articular periprosthetic distal femur fracture with synthesis of the femur plate with cortical nonlocking screws.  (Thought to be pathologic fracture due to osteoporosis) -> after a fall   ORIF  FEMUR W/ PERI-IMPLANT  08/29/2020   [facility]) revision of left femur fracture ORIF plate fixation screws; culture positive for MSSA   Right and left CARDIAC CATHETERIZATION  04/23/2013   Angiographic normal coronaries; LVEDP 20 mmHg, PCWP 12-14 mmHg, RAP 12 mmHg.; Fick CO/CI 4.9/2.2   ROTATOR CUFF REPAIR Right 12/09/2021   SHOULDER ARTHROSCOPY Left 03/14/2019   Procedure: LEFT SHOULDER ARTHROSCOPY, DEBRIDEMENT, AND DECOMPRESSION;  Surgeon: Harden Jerona GAILS, MD;  [facility];  Service: Orthopedics;  Laterality: Left;   TOTAL KNEE ARTHROPLASTY Right 06/29/2017   Procedure: RIGHT TOTAL KNEE ARTHROPLASTY;  Surgeon: Harden Jerona GAILS, MD;  Location: [facility] OR;  Service: Orthopedics;  Laterality: Right;   TOTAL KNEE ARTHROPLASTY Left 12/07/2017   TOTAL KNEE ARTHROPLASTY Left 12/07/2017   Procedure: LEFT TOTAL KNEE ARTHROPLASTY;  Surgeon: Harden Jerona GAILS, MD;  Location: [facility] OR;  Service: Orthopedics;  Laterality: Left;   TRACHEOSTOMY TUBE PLACEMENT N/A 09/20/2017   Procedure: AWAKE INTUBATION WITH ANESTHESIA WITH VIDEO ASSISTANCE;  Surgeon: Terri Alan PARAS, MD;  Location[facility];  Service: ENT;  Laterality: N/A;   TRANSTHORACIC ECHOCARDIOGRAM  08/2014   Normal LV size and function.  Mild LVH.  EF 55-60%.  Normal regional wall motion.  GR 1 DD.  Normal RV size and function .   TRANSTHORACIC ECHOCARDIOGRAM  08/12/2020    [facility]): Normal LV size and function.  EF 55 to 60%.  No RWM A.  Normal filling pattern.  Normal RV.  No significant valvular disease-stenosis or regurgitation.  No vegetation.   TUBAL LIGATION     with reversal in 1994   UPPER GASTROINTESTINAL ENDOSCOPY     VENTRAL HERNIA REPAIR     Family History  Problem Relation Age of Onset   Diabetes Father    Heart attack Father 54   Coronary artery disease Father    Heart failure Father  Throat cancer Father    Hypertension Father    Heart disease Father    Sleep apnea Father    Obesity Father    COPD  Mother    Emphysema Mother    Asthma Mother    Heart failure Mother    Breast cancer Mother    Diabetes Mother    Kidney disease Mother    Thyroid  disease Mother    Heart attack Maternal Grandfather    Sarcoidosis Maternal Uncle    Lung cancer Brother    Diabetes Brother    Diabetes Brother    Colon cancer Neg Hx    Colon polyps Neg Hx    Esophageal cancer Neg Hx    Rectal cancer Neg Hx    Stomach cancer Neg Hx    Social History   Socioeconomic History   Marital status: Married    Spouse name: AOKI WEDEMEYER   Number of children: 2   Years of education: 12   Highest education level: 12th grade  Occupational History   Occupation: DISABLED  Tobacco Use   Smoking status: Never   Smokeless tobacco: Never  Vaping Use   Vaping status: Never Used  Substance and Sexual Activity   Alcohol use: No   Drug use: No   Sexual activity: Yes    Birth control/protection: Surgical  Other Topics Concern   Not on file  Social History Narrative   Married 1994. 2 sons who both live close and 1 grandson.       Disability due to sarcoidosis. 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 [employer]   Social Drivers of Health   Financial Resource Strain: Low Risk  (04/05/2024)   Overall Financial Resource Strain (CARDIA)    Difficulty of Paying Living Expenses: Not hard at all  Food Insecurity: No Food Insecurity (04/05/2024)   Hunger Vital Sign    Worried About Running Out of Food in the Last Year: Never true    Ran Out of Food in the Last Year: Never true  Transportation Needs: No Transportation Needs (04/05/2024)   PRAPARE - Administrator, Civil Service (Medical): No    Lack of Transportation (Non-Medical): No  Physical Activity: Insufficiently Active (04/05/2024)   Exercise Vital Sign    Days of Exercise per Week: 3 days    Minutes of Exercise  per Session: 30 min  Stress: No Stress Concern Present (04/05/2024)   [employer] of Occupational Health - Occupational Stress Questionnaire    Feeling of Stress: Not at all  Social Connections: Socially Integrated (04/05/2024)   Social Connection and Isolation Panel    Frequency of Communication with Friends and Family: More than three times a week    Frequency of Social Gatherings with Friends and Family: More than three times a week    Attends Religious Services: More than 4 times per year    Active Member of [employer] or Organizations: Yes    Attends Banker Meetings: 1 to 4 times per year    Marital Status: Married    Tobacco Counseling Counseling given: Not Answered    Clinical Intake:  Pre-visit preparation completed: Yes  Pain : No/denies pain     BMI - recorded: 32.28 Nutritional Status: BMI > 30  Obese Diabetes: Yes CBG done?: Yes (200 per pt after breakfast) CBG resulted in Enter/  Edit results?: No Did pt. bring in CBG monitor from home?: No  Lab Results  Component Value Date   HGBA1C 6.4 (H) 03/14/2024   HGBA1C 6.9 (A) 01/18/2024   HGBA1C 6.6 (A) 10/20/2023     How often do you need to have someone help you when you read instructions, pamphlets, or other written materials from your doctor or pharmacy?: 1 - Never  Interpreter Needed?: No  Information entered by :: Ellouise Haws, LPN   Activities of Daily Living     04/03/2024   10:07 PM 03/26/2024    5:49 AM  In your present state of health, do you have any difficulty performing the following activities:  Hearing? 0   Vision? 0   Difficulty concentrating or making decisions? 0   Walking or climbing stairs? 0   Dressing or bathing? 0   Doing errands, shopping? 0 0  Preparing Food and eating ? N   Using the Toilet? N   In the past six months, have you accidently leaked urine? N   Do you have problems with loss of bowel control? N   Managing your Medications? N   Managing your  Finances? N   Housekeeping or managing your Housekeeping? N     Patient Care Team: Katrinka Garnette KIDD, MD as PCP - General (Family Medicine) Anner Alm ORN, MD as PCP - Cardiology (Cardiology) Brien Belvie BRAVO, MD (Pulmonary Disease) Jane Charleston, MD as Consulting Physician (Orthopedic Surgery) Rolfe Glendia DEL, MD as Referring Physician (Pulmonary Disease) Charmayne Molly, MD as Consulting Physician (Ophthalmology) Dow Maxwell, PT as Physical Therapist (Physical Therapy) Taam-Akelman, Rosalea K, MD as Consulting Physician (Obstetrics and Gynecology) Janit Thresa HERO, DPM as Consulting Physician (Podiatry)  I have updated your Care Teams any recent Medical Services you may have received from other providers in the past year.     Assessment:   This is a routine wellness examination for [place].  Hearing/Vision screen Hearing Screening - Comments:: Pt denies any hearing issues  Vision Screening - Comments:: Wears rx glasses - up to date with routine eye exams with Dr Molly lyles     Goals Addressed             This Visit's Progress    Patient Stated       Weight loss        Depression Screen     04/05/2024    2:58 PM 02/09/2024    1:43 PM 08/29/2023    3:23 PM 08/18/2023    2:48 PM 08/03/2023    9:45 AM 06/15/2023    2:22 PM 03/15/2023   11:32 AM  PHQ 2/9 Scores  PHQ - 2 Score 0 0 0 0 0 0 0  PHQ- 9 Score 0 0 0 4   0    Fall Risk     04/05/2024    3:01 PM 04/03/2024   10:07 PM 11/08/2023   10:51 AM 08/18/2023    2:48 PM 08/03/2023    9:45 AM  Fall Risk   Falls in the past year? 0 0 1 0 0  Number falls in past yr:  0 1 0 0  Injury with Fall?  0 1 0 0  Risk for fall due to : No Fall Risks  History of fall(s) No Fall Risks No Fall Risks  Follow up Falls prevention discussed  Falls evaluation completed Falls evaluation completed     MEDICARE RISK AT HOME:  Medicare Risk at Home Any stairs in or around  the home?: Yes If so, are there any without handrails?:  No Home free of loose throw rugs in walkways, pet beds, electrical cords, etc?: Yes Adequate lighting in your home to reduce risk of falls?: Yes Life alert?: No Use of a cane, walker or w/c?: No Grab bars in the bathroom?: No Shower chair or bench in shower?: No Elevated toilet seat or a handicapped toilet?: No  TIMED UP AND GO:  Was the test performed?  No  Cognitive Function: 6CIT completed        04/05/2024    3:01 PM 03/15/2023   11:36 AM 02/15/2022    9:32 AM 02/09/2021    9:48 AM 02/01/2020    1:43 PM  6CIT Screen  What Year? 0 points 0 points 0 points 0 points 0 points  What month? 0 points 0 points 0 points 0 points 0 points  What time? 0 points 0 points 0 points  0 points  Count back from 20 0 points 0 points 0 points 0 points 0 points  Months in reverse 0 points 0 points 2 points 0 points 0 points  Repeat phrase 0 points 0 points 0 points 2 points 0 points  Total Score 0 points 0 points 2 points  0 points    Immunizations Immunization History  Administered Date(s) Administered   Fluzone Influenza virus vaccine,trivalent (IIV3), split virus 08/15/2007, 06/26/2008, 07/23/2009, 07/16/2010   H1N1 09/24/2008   Hep A / Hep B 02/20/2018, 04/03/2018, 08/23/2018   Influenza Inj Mdck Quad With Preservative 07/12/2019   Influenza Split 09/24/2008, 07/07/2011, 07/18/2012, 06/12/2013, 07/15/2014, 08/08/2015, 06/22/2016, 07/01/2017, 08/16/2017, 07/14/2018, 07/04/2019, 07/12/2019, 07/26/2019, 07/02/2020   Influenza Whole 08/15/2007, 06/26/2008, 07/23/2009, 07/16/2010   Influenza, Seasonal, Injecte, Preservative Fre 10/26/2011, 07/18/2012, 07/04/2023   Influenza,inj,Quad PF,6+ Mos 06/12/2013, 07/15/2014, 08/08/2015, 06/22/2016, 07/01/2017, 07/14/2018, 07/04/2019, 07/02/2020, 07/07/2021, 06/15/2022   Influenza-Unspecified 09/24/2008, 07/07/2011, 07/18/2012, 06/12/2013, 07/15/2014, 08/08/2015, 06/22/2016, 07/01/2017, 07/14/2018, 07/04/2019, 07/12/2019, 07/26/2019, 07/02/2020   Novel  Infuenza-h1n1-09 09/24/2008   PFIZER Comirnaty(Gray Top)Covid-19 Tri-Sucrose Vaccine 04/15/2021   PFIZER(Purple Top)SARS-COV-2 Vaccination 12/26/2019, 01/16/2020, 04/15/2021, 07/08/2021   Pfizer(Comirnaty)Fall Seasonal Vaccine 12 years and older 07/22/2022   Pneumococcal Conjugate-13 05/06/2014   Pneumococcal Polysaccharide-23 09/30/2008, 02/13/2013   Td 05/21/2010   Td (Adult), 2 Lf Tetanus Toxid, Preservative Free 05/21/2010   Tdap 05/21/2010, 07/02/2020   Unspecified SARS-COV-2 Vaccination 05/12/2023   Zoster Recombinant(Shingrix ) 03/02/2017, 05/02/2017   Zoster, Unspecified 03/02/2017, 05/02/2017    Screening Tests Health Maintenance  Topic Date Due   Pneumococcal Vaccine 54-61 Years old (4 of 4 - PCV20 or PCV21) 05/07/2019   COVID-19 Vaccine (7 - 2024-25 season) 04/21/2024 (Originally 07/07/2023)   OPHTHALMOLOGY EXAM  05/08/2024   INFLUENZA VACCINE  05/11/2024   HEMOGLOBIN A1C  09/13/2024   MAMMOGRAM  10/09/2024   Diabetic kidney evaluation - Urine ACR  10/19/2024   FOOT EXAM  01/12/2025   Diabetic kidney evaluation - eGFR measurement  03/26/2025   Medicare Annual Wellness (AWV)  04/05/2025   Cervical Cancer Screening (HPV/Pap Cotest)  07/08/2025   DEXA SCAN  03/02/2026   Colonoscopy  01/07/2028   DTaP/Tdap/Td (4 - Td or Tdap) 07/02/2030   Hepatitis B Vaccines  Completed   Hepatitis C Screening  Completed   HIV Screening  Completed   Zoster Vaccines- Shingrix   Completed   HPV VACCINES  Aged Out   Meningococcal B Vaccine  Aged Out    Health Maintenance  Health Maintenance Due  Topic Date Due   Pneumococcal Vaccine 34-39 Years old (4 of 4 -  PCV20 or PCV21) 05/07/2019   Health Maintenance Items Addressed: See Nurse Notes at the end of this note  Additional Screening:  Vision Screening: Recommended annual ophthalmology exams for early detection of glaucoma and other disorders of the eye. Would you like a referral to an eye doctor? No    Dental Screening:  Recommended annual dental exams for proper oral hygiene  Community Resource Referral / Chronic Care Management: CRR required this visit?  No   CCM required this visit?  No   Plan:    I have personally reviewed and noted the following in the patient's chart:   Medical and social history Use of alcohol, tobacco or illicit drugs  Current medications and supplements including opioid prescriptions. Patient is currently taking opioid prescriptions. Information provided to patient regarding non-opioid alternatives. Patient advised to discuss non-opioid treatment plan with their provider. Functional ability and status Nutritional status Physical activity Advanced directives List of other physicians Hospitalizations, surgeries, and ER visits in previous 12 months Vitals Screenings to include cognitive, depression, and falls Referrals and appointments  In addition, I have reviewed and discussed with patient certain preventive protocols, quality metrics, and best practice recommendations. A written personalized care plan for preventive services as well as general preventive health recommendations were provided to patient.   Ellouise VEAR Haws, LPN   [id number]   After Visit Summary: (MyChart) Due to this being a telephonic visit, the after visit summary with patients personalized plan was offered to patient via MyChart   Notes: Nothing significant to report at this time.

## 2024-04-05 NOTE — Patient Instructions (Addendum)
 [name] , Thank you for taking time out of your busy schedule to complete your Annual Wellness Visit with me. I enjoyed our conversation and look forward to speaking with you again next year. I, as well as your care team,  appreciate your ongoing commitment to your health goals. Please review the following plan we discussed and let me know if I can assist you in the future. Your Game plan/ To Do List    Referrals: If you haven't heard from the office you've been referred to, please reach out to them at the phone provided.   Follow up Visits: Next Medicare AWV with our clinical staff: 04/09/25  Have you seen your provider in the last 6 months (3 months if uncontrolled diabetes)? Yes Next Office Visit with your provider: pt will follow up at a later date for appt   Clinician Recommendations:  Aim for 30 minutes of exercise or brisk walking, 6-8 glasses of water , and 5 servings of fruits and vegetables each day.        This is a list of the screening recommended for you and due dates:  Health Maintenance  Topic Date Due   Pneumococcal Vaccination (4 of 4 - PCV20 or PCV21) 05/07/2019   Medicare Annual Wellness Visit  03/14/2024   COVID-19 Vaccine (7 - 2024-25 season) 04/21/2024*   Eye exam for diabetics  05/08/2024   Flu Shot  05/11/2024   Hemoglobin A1C  09/13/2024   Mammogram  10/09/2024   Yearly kidney health urinalysis for diabetes  10/19/2024   Complete foot exam   01/12/2025   Yearly kidney function blood test for diabetes  03/26/2025   Pap with HPV screening  07/08/2025   DEXA scan (bone density measurement)  03/02/2026   Colon Cancer Screening  01/07/2028   DTaP/Tdap/Td vaccine (4 - Td or Tdap) 07/02/2030   Hepatitis B Vaccine  Completed   Hepatitis C Screening  Completed   HIV Screening  Completed   Zoster (Shingles) Vaccine  Completed   HPV Vaccine  Aged Out   Meningitis B Vaccine  Aged Out  *Topic was postponed. The date shown is not the original due date.     Advanced directives: (Declined) Advance directive discussed with you today. Even though you declined this today, please call our office should you change your mind, and we can give you the proper paperwork for you to fill out. Advance Care Planning is important because it:  [x]  Makes sure you receive the medical care that is consistent with your values, goals, and preferences  [x]  It provides guidance to your family and loved ones and reduces their decisional burden about whether or not they are making the right decisions based on your wishes.  Follow the link provided in your after visit summary or read over the paperwork we have mailed to you to help you started getting your Advance Directives in place. If you need assistance in completing these, please reach out to us  so that we can help you!  See attachments for Preventive Care and Fall Prevention Tips.

## 2024-04-06 ENCOUNTER — Other Ambulatory Visit: Payer: Self-pay | Admitting: Internal Medicine

## 2024-04-06 ENCOUNTER — Other Ambulatory Visit: Payer: Self-pay | Admitting: Family Medicine

## 2024-04-06 LAB — URINE CULTURE
MICRO NUMBER:: 16629272
SPECIMEN QUALITY:: ADEQUATE

## 2024-04-06 NOTE — Telephone Encounter (Signed)
 See pharmacy note, ok to change to once daily?

## 2024-04-09 ENCOUNTER — Ambulatory Visit: Payer: Self-pay | Admitting: Family

## 2024-04-10 ENCOUNTER — Telehealth: Payer: Self-pay

## 2024-04-10 ENCOUNTER — Other Ambulatory Visit (HOSPITAL_COMMUNITY): Payer: Self-pay

## 2024-04-10 NOTE — Telephone Encounter (Signed)
 Please submit this to prior authorization team-please let them know that she has been stable on this dose for prolonged period and reducing the dose to once a day could create instability  Please let patient know that we are having issues from insurance but trying to work around them - she can try cash pay like goodrx if too costly

## 2024-04-10 NOTE — Telephone Encounter (Signed)
 See note below

## 2024-04-10 NOTE — Telephone Encounter (Addendum)
 Pharmacy Patient Advocate Encounter   Received notification from Pt Calls Messages that prior authorization for venlafaxine  XR 75 MG 24 hr capsule is required/requested.   Insurance verification completed.   The patient is insured through [name] .   Per test claim: The current 30 day co-pay is, $0.00.  No PA needed at this time. This test claim was processed through [facility]- copay amounts may vary at other pharmacies due to pharmacy/plan contracts, or as the patient moves through the different stages of their insurance plan.     Called pharmacy to process with PA code [id number]

## 2024-04-10 NOTE — Telephone Encounter (Signed)
 Thank you so much, please keep us  updated.

## 2024-04-12 ENCOUNTER — Other Ambulatory Visit

## 2024-04-12 LAB — BASIC METABOLIC PANEL WITH GFR
BUN/Creatinine Ratio: 16 (calc) (ref 6–22)
BUN: 24 mg/dL (ref 7–25)
CO2: 27 mmol/L (ref 20–32)
Calcium: 9.7 mg/dL (ref 8.6–10.4)
Chloride: 103 mmol/L (ref 98–110)
Creat: 1.48 mg/dL — ABNORMAL HIGH (ref 0.50–1.05)
Glucose, Bld: 119 mg/dL — ABNORMAL HIGH (ref 65–99)
Potassium: 5.3 mmol/L (ref 3.5–5.3)
Sodium: 140 mmol/L (ref 135–146)
eGFR: 39 mL/min/{1.73_m2} — ABNORMAL LOW (ref >=60)

## 2024-04-15 ENCOUNTER — Other Ambulatory Visit: Payer: Self-pay | Admitting: "Endocrinology

## 2024-04-15 DIAGNOSIS — E1165 Type 2 diabetes mellitus with hyperglycemia: Secondary | ICD-10-CM

## 2024-04-16 ENCOUNTER — Ambulatory Visit (INDEPENDENT_AMBULATORY_CARE_PROVIDER_SITE_OTHER): Admitting: Family

## 2024-04-16 VITALS — BP 120/64 | HR 93 | Temp 98.2°F | Ht 66.0 in | Wt 197.6 lb

## 2024-04-16 DIAGNOSIS — M542 Cervicalgia: Secondary | ICD-10-CM

## 2024-04-16 MED ORDER — CYCLOBENZAPRINE HCL 5 MG PO TABS: 5.0000 mg | ORAL_TABLET | Freq: Three times a day (TID) | ORAL | 0 refills | Status: AC | PRN

## 2024-04-16 NOTE — Progress Notes (Signed)
 Patient ID: [name], female    DOB: 06/26/1961, 63 y.o.   MRN: [id number]  Chief Complaint  Patient presents with   Neck Pain    Hurts in ear and back down in her neck. Started a couple days ago. Tried [employer] and nothing is helping. First time she has been seen for this.   Discussed the use of AI scribe software for clinical note transcription with the patient, who gave verbal consent to proceed.  History of Present Illness [name] is a 63 year old female who presents with ear and shoulder pain.  Otalgia and referred pain - Ear pain radiating to the arm and shoulder - Pain is tender to touch - Onset since last Wednesday or Thursday - Uncertain if pain originates in the ear or below the ear  Musculoskeletal pain - Shoulder and arm pain associated with ear pain - No recent incidents of awkward sleeping positions or neck strain that she can remember - Pain managed with ice, heat, and topical analgesics such as Icyhot  Medication intolerance - Previously used Flexeril , and did tolerate well  Assessment & Plan Cervicalgia Pain below left ear, neck, and top of shoulder likely due to strain. No ear infection. Considered Flexeril  for muscle relaxation, advised caution due to drowsiness. Low GFR, NSAIDs contraindicated. Recommended Tylenol .  - Continue heat and analgesic rubs several times per day. - Prescribe Flexeril  5 mg tid prn, option for 10 mg, mainly in evening or at home. - Advise generic Tylenol  extra strength or arthritis (650 mg), alternate or can take with Flexeril . - Send prescription to [facility] pharmacy.   Subjective:    Outpatient Medications Prior to Visit  Medication Sig Dispense Refill   albuterol  (PROVENTIL ) (2.5 MG/3ML) 0.083% nebulizer solution Take 3 mLs (2.5 mg total) by nebulization every 6 (six) hours as needed for wheezing or shortness of breath (Use 2-3 times per day when sick.). 75 mL 2   albuterol  (VENTOLIN  HFA) 108 (90 Base) MCG/ACT  inhaler Inhale 1-2 puffs into the lungs every 4 (four) hours as needed for wheezing or shortness of breath. 8.5 each 1   aspirin  EC 81 MG tablet Take 1 tablet (81 mg total) by mouth daily. 30 tablet 11   atorvastatin  (LIPITOR) 40 MG tablet Take 1 tablet (40 mg total) by mouth daily. 90 tablet 3   benzonatate  (TESSALON  PERLES) 100 MG capsule Take 1-2 capsules (100-200 mg total) by mouth 3 (three) times daily as needed for cough. 30 capsule 3   BREO ELLIPTA  200-25 MCG/INH AEPB TAKE 1 PUFF BY MOUTH EVERY DAY 60 each 5   buPROPion  (WELLBUTRIN  XL) 300 MG 24 hr tablet TAKE 1 TABLET BY MOUTH EVERY DAY 90 tablet 3   Calcium  Carbonate (CALTRATE 600 PO) Orally Once a day for 30 day(s)     chlorpheniramine -HYDROcodone  (TUSSIONEX) 10-8 MG/5ML Take 5 mLs by mouth at bedtime as needed for cough (do not drive for 8 hours after taking). 115 mL 0   Continuous Glucose Sensor (DEXCOM G7 SENSOR) MISC 1 DEVICE BY DOES NOT APPLY ROUTE AS DIRECTED. CHANGE SENSOR EVERY 10 DAYS 3 each 3   cyclobenzaprine  (FLEXERIL ) 5 MG tablet Take 1 tablet (5 mg total) by mouth 3 (three) times daily as needed for muscle spasms (or Headache). 30 tablet 0   diltiazem  (CARDIZEM  CD) 120 MG 24 hr capsule TAKE 1 CAPSULE BY MOUTH EVERY DAY 90 capsule 3   estradiol  (ESTRACE ) 0.1 MG/GM vaginal cream Place 0.5 Applicatorfuls vaginally  daily as needed (irritation).     famotidine  (PEPCID ) 20 MG tablet TAKE 1 TABLET BY MOUTH TWICE A DAY 180 tablet 1   fenofibrate  160 MG tablet TAKE 1 TABLET BY MOUTH EVERY DAY 90 tablet 1   fluticasone  (FLONASE ) 50 MCG/ACT nasal spray Place 2 sprays into both nostrils daily as needed for allergies. 48 mL 3   furosemide  (LASIX ) 40 MG tablet Take 1 tablet (40 mg total) by mouth daily. 90 tablet 3   gabapentin  (NEURONTIN ) 100 MG capsule TAKE 1 CAPSULE BY MOUTH THREE TIMES A DAY 270 capsule 3   glucose blood (CONTOUR NEXT TEST) test strip 1 each by Other route 4 (four) times daily. And lancets 4/day 400 each 3    guaiFENesin -dextromethorphan  (ROBITUSSIN DM) 100-10 MG/5ML syrup Take 5 mLs by mouth every 4 (four) hours as needed for cough (chest congestion). 118 mL 0   Insulin  Pen Needle (PEN NEEDLES) 32G X 4 MM MISC 1 each by Does not apply route 4 (four) times daily. E11.9 400 each 0   ipratropium (ATROVENT ) 0.06 % nasal spray PLACE 2 SPRAYS INTO BOTH NOSTRILS 4 TIMES DAILY. 45 mL 3   KLOR-CON  M20 20 MEQ tablet TAKE 1 & 1/2 TABLETS BY MOUTH TWICE A DAY 270 tablet 2   Lancet Devices (EASY MINI EJECT LANCING DEVICE) MISC Use as directed 4 times daily to test blood sugar 1 each 3   LANTUS  SOLOSTAR 100 UNIT/ML Solostar Pen Inject 24 Units into the skin daily.     metFORMIN  (GLUCOPHAGE -XR) 500 MG 24 hr tablet TAKE 4 TABLETS BY MOUTH DAILY WITH BREAKFAST. 360 tablet 0   Microlet Lancets MISC 1 each by Does not apply route 2 (two) times daily. E11.9 100 each 2   ondansetron  (ZOFRAN -ODT) 4 MG disintegrating tablet Take 1 tablet (4 mg total) by mouth every 8 (eight) hours as needed for nausea or vomiting. 20 tablet 0   pantoprazole  (PROTONIX ) 40 MG tablet TAKE 1 TABLET BY MOUTH TWICE A DAY 180 tablet 1   sulfamethoxazole -trimethoprim  (BACTRIM  DS) 800-160 MG tablet Take 1 tablet by mouth 2 (two) times daily after a meal. 6 tablet 0   telmisartan  (MICARDIS ) 40 MG tablet TAKE 1 TABLET BY MOUTH EVERY DAY 90 tablet 3   tirzepatide  (MOUNJARO ) 7.5 MG/0.5ML Pen Inject 7.5 mg into the skin once a week. 6 mL 0   traMADol  (ULTRAM ) 50 MG tablet Take 1 tablet (50 mg total) by mouth every 6 (six) hours as needed. 20 tablet 0   venlafaxine  XR (EFFEXOR -XR) 75 MG 24 hr capsule TAKE 1 CAPSULE BY MOUTH TWICE A DAY 180 capsule 3   No facility-administered medications prior to visit.   Past Medical History:  Diagnosis Date   Abnormal SPEP 04/17/2014   Acute left ankle pain 01/26/2017   Allergy    ANEMIA-UNSPECIFIED 09/18/2009   Anxiety    Arthritis    Blood transfusion without reported diagnosis    Cataract    CHF (congestive  heart failure) (HCC)    Chronic diastolic heart failure, NYHA class 2 (HCC)    Normal LVEDP by May 2018   COPD (chronic obstructive pulmonary disease) (HCC)    Depression    DIABETES MELLITUS, TYPE II 08/21/2006   Diabetic osteomyelitis (HCC) 05/29/2015   Fatty liver    Fracture of 5th metatarsal    non union   GERD 08/21/2006   Had fundoplication   GOUT 08/20/2010   Hammer toe of right foot    2-5th toes  Hx of umbilical hernia repair    HYPERLIPIDEMIA 08/21/2006   HYPERTENSION 08/21/2006   Infection of prosthetic left knee joint (HCC) 08/29/2020   Infection of wound due to methicillin resistant Staphylococcus aureus (MRSA)    Internal hemorrhoids    Kidney problem    Multiple allergies 10/14/2016   OBESITY 06/04/2009   Onychomycosis 10/27/2015   Osteomyelitis of left foot (HCC) 05/29/2015   Osteoporosis    Pulmonary sarcoidosis (HCC)    Followed locally by pulmonology, but also by Dr. Lavella at [facility]   Right knee pain 01/26/2017   Vocal cord dysfunction    Wears partial dentures    Past Surgical History:  Procedure Laterality Date   ABDOMINAL HYSTERECTOMY     APPENDECTOMY     BLADDER SUSPENSION  11/11/2011   Procedure: TRANSVAGINAL TAPE (TVT) PROCEDURE;  Surgeon: Oneil FORBES Piety, MD;  Location: [facility];  Service: Gynecology;  Laterality: N/A;   CAROTIDS  02/18/2011   CAROTID DUPLEX; VERTEBRALS ARE PATENT WITH ANTEGRADE FLOW. ICA/CCA RATIO 1.61 ON RIGHT AND 0.75 ON LEFT   CATARACT EXTRACTION, BILATERAL     CHOLECYSTECTOMY  1984   COLONOSCOPY  04/29/2010   [facility]   CYSTOSCOPY  11/11/2011   Procedure: CYSTOSCOPY;  Surgeon: Oneil FORBES Piety, MD;  Location: [facility];  Service: Gynecology;  Laterality: N/A;   EXTUBATION (ENDOTRACHEAL) IN OR N/A 09/23/2017   Procedure: EXTUBATION (ENDOTRACHEAL) IN OR;  Surgeon: Terri Alan PARAS, MD;  Location: [facility];  Service: ENT;  Laterality: N/A;   FIBEROPTIC LARYNGOSCOPY AND TRACHEOSCOPY N/A 09/23/2017   Procedure:  FLEXIBLE FIBEROPTIC LARYNGOSCOPY;  Surgeon: Terri Alan PARAS, MD;  Location: [facility];  Service: ENT;  Laterality: N/A;   FRACTURE SURGERY     foot   HALLUX FUSION Left 10/02/2018   Procedure: HALLUX ARTHRODESIS;  Surgeon: Janit Thresa HERO, DPM;  Location: [facility];  Service: Podiatry;  Laterality: Left;   HAMMER TOE SURGERY Left 10/02/2018   Procedure: HAMMER TOE CORRECTION 2ND 3RD 4RD FIFTH TOE;  Surgeon: Janit Thresa HERO, DPM;  Location: [facility];  Service: Podiatry;  Laterality: Left;   HAMMER TOE SURGERY Right 04/12/2019   Procedure: HAMMER TOE CORRECTION, 2ND, 3RD, 4TH AND 5TH TOES OF RIGHT FOOT;  Surgeon: Janit Thresa HERO, DPM;  Location: [facility];  Service: Podiatry;  Laterality: Right;   HAMMER TOE SURGERY Left 10/25/2019   Procedure: HAMMER TOE REPAIR SECOND, THIRD, FOUTH;  Surgeon: Janit Thresa HERO, DPM;  Location: [facility];  Service: Podiatry;  Laterality: Left;   HERNIA REPAIR     I & D EXTREMITY Left 06/27/2015   Procedure: Partial Excision Left Calcaneus, Place Antibiotic Beads, and Wound VAC;  Surgeon: Jerona Harden GAILS, MD;  Location: [facility];  Service: Orthopedics;  Laterality: Left;   JOINT REPLACEMENT     KNEE ARTHROSCOPY     right   LAPAROSCOPIC ASSISTED VENTRAL HERNIA REPAIR N/A 03/26/2024   Procedure: REPAIR, HERNIA, VENTRAL, LAPAROSCOPY-ASSISTED;  Surgeon: Rubin Calamity, MD;  Location: [facility];  Service: General;  Laterality: N/A;  LAP VENTRAL HERNIA REPAIR WITH MESH   LAPAROSCOPIC LYSIS OF ADHESIONS N/A 03/26/2024   Procedure: LYSIS, ADHESIONS, LAPAROSCOPIC;  Surgeon: Rubin Calamity, MD;  Location: [facility] OR;  Service: General;  Laterality: N/A;   LAPAROSCOPY N/A 03/26/2024   Procedure: LAPAROSCOPY, DIAGNOSTIC;  Surgeon: Rubin Calamity, MD;  Location[facility] OR;  Service: General;  Laterality: N/A;   LEFT AND RIGHT HEART CATHETERIZATION WITH CORONARY ANGIOGRAM N/A 04/23/2013   Procedure: LEFT AND RIGHT HEART  CATHETERIZATION WITH CORONARY ANGIOGRAM;  Surgeon: Alm LELON Clay, MD;  Location: [facility]  CATH LAB;  Service: Cardiovascular;  Laterality: N/A;   LEFT HEART CATH AND CORONARY ANGIOGRAPHY N/A 03/11/2017   Procedure: Left Heart Cath and Coronary Angiography;  Surgeon: Wonda Sharper, MD;  Location: [facility] INVASIVE CV LAB; angiographically minimal CAD in the LAD otherwise normal.;  Normal LVEDP.  FALSE POSITIVE MYOVIEW    LEFT HEART CATH AND CORONARY ANGIOGRAPHY  07/20/2010   LVEF 50-55% WITH VERY MILD GLOBAL HYPOKINESIA; ESSENTIALLY NORMAL CORONARY ARTERIES; NORMAL LV FUNCTION   METATARSAL OSTEOTOMY WITH OPEN REDUCTION INTERNAL FIXATION (ORIF) METATARSAL WITH FUSION Left 04/09/2014   Procedure: LEFT FOOT FRACTURE OPEN TREATMENT METATARSAL INCLUDES INTERNAL FIXATION EACH;  Surgeon: Lamar DELENA Millman, MD;  Location: [facility];  Service: Orthopedics;  Laterality: Left;   NISSEN FUNDOPLICATION  2004   NM MYOVIEW  LTD --> FALSE POSITIVE  03/10/2017   : Moderate size stress-induced perfusion defect at the apex as well as ill-defined stress-induced perfusion defect in the lateral wall.  EF 55%.  INTERMEDIATE risk. -->  FALSE POSITIVE   ORIF DISTAL FEMUR FRACTURE  08/08/2020   [facility]: ORIF of left extra-articular periprosthetic distal femur fracture with synthesis of the femur plate with cortical nonlocking screws.  (Thought to be pathologic fracture due to osteoporosis) -> after a fall   ORIF FEMUR W/ PERI-IMPLANT  08/29/2020   [facility]) revision of left femur fracture ORIF plate fixation screws; culture positive for MSSA   Right and left CARDIAC CATHETERIZATION  04/23/2013   Angiographic normal coronaries; LVEDP 20 mmHg, PCWP 12-14 mmHg, RAP 12 mmHg.; Fick CO/CI 4.9/2.2   ROTATOR CUFF REPAIR Right 12/09/2021   SHOULDER ARTHROSCOPY Left 03/14/2019   Procedure: LEFT SHOULDER ARTHROSCOPY, DEBRIDEMENT, AND DECOMPRESSION;  Surgeon: Harden Jerona GAILS, MD;  Location: [facility];  Service: Orthopedics;  Laterality: Left;   TOTAL KNEE ARTHROPLASTY Right 06/29/2017    Procedure: RIGHT TOTAL KNEE ARTHROPLASTY;  Surgeon: Harden Jerona GAILS, MD;  Location: [facility] OR;  Service: Orthopedics;  Laterality: Right;   TOTAL KNEE ARTHROPLASTY Left 12/07/2017   TOTAL KNEE ARTHROPLASTY Left 12/07/2017   Procedure: LEFT TOTAL KNEE ARTHROPLASTY;  Surgeon: Harden Jerona GAILS, MD;  Location: [facility] OR;  Service: Orthopedics;  Laterality: Left;   TRACHEOSTOMY TUBE PLACEMENT N/A 09/20/2017   Procedure: AWAKE INTUBATION WITH ANESTHESIA WITH VIDEO ASSISTANCE;  Surgeon: Terri Alan PARAS, MD;  Location: [facility];  Service: ENT;  Laterality: N/A;   TRANSTHORACIC ECHOCARDIOGRAM  08/2014   Normal LV size and function.  Mild LVH.  EF 55-60%.  Normal regional wall motion.  GR 1 DD.  Normal RV size and function .   TRANSTHORACIC ECHOCARDIOGRAM  08/12/2020    [facility]): Normal LV size and function.  EF 55 to 60%.  No RWM A.  Normal filling pattern.  Normal RV.  No significant valvular disease-stenosis or regurgitation.  No vegetation.   TUBAL LIGATION     with reversal in 1994   UPPER GASTROINTESTINAL ENDOSCOPY     VENTRAL HERNIA REPAIR     Allergies  Allergen Reactions   Firvanq  [Vancomycin ] Other (See Comments)    Dose related nephrotoxicity    Methotrexate Other (See Comments)    Peri-oral and buccal lesions   Lisinopril  Cough   Chlorhexidine  Itching   Cleocin  [Clindamycin ] Nausea And Vomiting and Rash   Lincocin [Lincomycin Hcl] Nausea And Vomiting and Rash   Teflaro [Ceftaroline] Rash and Other (See Comments)  Tolerates ceftriaxone        Objective:    Physical Exam Vitals and nursing note reviewed.  Constitutional:      Appearance: Normal appearance.  Cardiovascular:     Rate and Rhythm: Normal rate and regular rhythm.  Pulmonary:     Effort: Pulmonary effort is normal.     Breath sounds: Normal breath sounds.  Musculoskeletal:     Cervical back: No edema, erythema, signs of trauma or rigidity. Pain with movement and muscular tenderness (left neck and trapezius)  present. No spinous process tenderness. Decreased range of motion.  Skin:    General: Skin is warm and dry.  Neurological:     Mental Status: She is alert.  Psychiatric:        Mood and Affect: Mood normal.        Behavior: Behavior normal.    BP 120/64   Pulse 93   Temp 98.2 F (36.8 C) (Temporal)   Ht 5' 6 (1.676 m)   Wt 197 lb 9.6 oz (89.6 kg)   SpO2 97%   BMI 31.89 kg/m  Wt Readings from Last 3 Encounters:  04/16/24 197 lb 9.6 oz (89.6 kg)  04/05/24 200 lb (90.7 kg)  04/05/24 200 lb 12.8 oz (91.1 kg)      Lucius Krabbe, NP

## 2024-04-18 ENCOUNTER — Ambulatory Visit (INDEPENDENT_AMBULATORY_CARE_PROVIDER_SITE_OTHER): Admitting: "Endocrinology

## 2024-04-18 ENCOUNTER — Encounter: Payer: Self-pay | Admitting: "Endocrinology

## 2024-04-18 VITALS — BP 120/60 | HR 85 | Ht 66.0 in | Wt 198.0 lb

## 2024-04-18 DIAGNOSIS — Z7984 Long term (current) use of oral hypoglycemic drugs: Secondary | ICD-10-CM | POA: Diagnosis not present

## 2024-04-18 DIAGNOSIS — E1165 Type 2 diabetes mellitus with hyperglycemia: Secondary | ICD-10-CM | POA: Diagnosis not present

## 2024-04-18 DIAGNOSIS — Z794 Long term (current) use of insulin: Secondary | ICD-10-CM

## 2024-04-18 DIAGNOSIS — Z7985 Long-term (current) use of injectable non-insulin antidiabetic drugs: Secondary | ICD-10-CM

## 2024-04-18 DIAGNOSIS — E782 Mixed hyperlipidemia: Secondary | ICD-10-CM

## 2024-04-18 MED ORDER — TIRZEPATIDE 10 MG/0.5ML ~~LOC~~ SOAJ
10.0000 mg | SUBCUTANEOUS | 1 refills | Status: DC
Start: 2024-04-18 — End: 2024-05-21

## 2024-04-18 NOTE — Progress Notes (Signed)
 Outpatient Endocrinology Note Obadiah Birmingham, MD  04/18/24   [name] August 07, 1961 [id number]  Referring Provider: Katrinka Garnette KIDD, MD Primary Care Provider: Katrinka Garnette KIDD, MD Reason for consultation: Subjective   Assessment & Plan  Diagnoses and all orders for this visit:  Uncontrolled type 2 diabetes mellitus with hyperglycemia, with long-term current use of insulin  (HCC)  Long term (current) use of oral hypoglycemic drugs  Long-term (current) use of injectable non-insulin  antidiabetic drugs  Long-term insulin  use (HCC)  Mixed hypercholesterolemia and hypertriglyceridemia  Other orders -     tirzepatide  (MOUNJARO ) 10 MG/0.5ML Pen; Inject 10 mg into the skin once a week.   Diabetes complicated by nephropathy Hba1c goal less than 7.0, current Hba1c is  Lab Results  Component Value Date   HGBA1C 6.4 (H) 03/14/2024   HGBA1C 6.9 (A) 01/18/2024   HGBA1C 6.6 (A) 10/20/2023    Will recommend the following: Metformin  1000 mg a day, mounjaro  10 mg/week Lantus  22 units at am BMP Q3 mo to check GFR (pt knows to cut metformin  to 1000 mg every day if GFR drops <45 and stop ti if GFR <30)  12/2023 Stopped premeal NovoLog  07-22-13 fifteen min before meals due to HYPOGLYCEMIA, as low as 40s Stopped Trulicity  4.5 mg weekly due to poor weight loss Got sick with ozempic   No known contraindications to any of above medications  Hyperlipidemia -Last LDL at goal: 56, Tg 154 -on atorvastatin  40 mg QD and fenofibrate  160 mg qd -Follow low fat diet and exercise   -Blood pressure goal <140/90 - Microalbumin/creatinine at goal < 30 - on ACE/ARB Telmisartan  40 mg qd -diet changes including salt restriction -limit eating outside -counseled BP targets per standards of diabetes care -Uncontrolled blood pressure can lead to retinopathy, nephropathy and cardiovascular and atherosclerotic heart disease  Reviewed and counseled on: -A1C target -Blood sugar  targets -Complications of uncontrolled diabetes  -Checking blood sugar before meals and bedtime and bring log next visit -All medications with mechanism of action and side effects -Hypoglycemia management: rule of 15's, Glucagon Emergency Kit and medical alert ID -low-carb low-fat plate-method diet -At least 20 minutes of physical activity per day -Annual dilated retinal eye exam and foot exam -compliance and follow up needs -follow up as scheduled or earlier if problem gets worse  Call if blood sugar is less than 70 or consistently above 250    Take a 15 gm snack of carbohydrate at bedtime before you go to sleep if your blood sugar is less than 100.    If you are going to fast after midnight for a test or procedure, ask your physician for instructions on how to reduce/decrease your insulin  dose.    Call if blood sugar is less than 70 or consistently above 250  -Treating a low sugar by rule of 15  (15 gms of sugar every 15 min until sugar is more than 70) If you feel your sugar is low, test your sugar to be sure If your sugar is low (less than 70), then take 15 grams of a fast acting Carbohydrate (3-4 glucose tablets or glucose gel or 4 ounces of juice or regular soda) Recheck your sugar 15 min after treating low to make sure it is more than 70 If sugar is still less than 70, treat again with 15 grams of carbohydrate          Don't drive the hour of hypoglycemia  If unconscious/unable to eat or drink by mouth,  use glucagon injection or nasal spray baqsimi and call 911. Can repeat again in 15 min if still unconscious.  Return in about 4 weeks (around 05/16/2024).   I have reviewed current medications, nurse's notes, allergies, vital signs, past medical and surgical history, family medical history, and social history for this encounter. Counseled patient on symptoms, examination findings, lab findings, imaging results, treatment decisions and monitoring and prognosis. The patient understood  the recommendations and agrees with the treatment plan. All questions regarding treatment plan were fully answered.  Obadiah Birmingham, MD  04/18/24   History of Present Illness [name] is a 63 y.o. year old female who presents for evaluation of Type 2 diabetes mellitus.  [name] was first diagnosed in 2007.    Home diabetes regimen: Metformin  2000 mg a day, Lantus  24 units at am,  Stopped Premeal NovoLog  07-22-13 units tidac   Stopped Trulicity  4.5 mg weekly, likes mounjaro  due to weight loss   Previous history:  Non-insulin  hypoglycemic drugs previously used: Ozempic , metformin  Insulin  was started a few years after diagnosis Side effects from medications: Ozempic : nausea  COMPLICATIONS -  MI/Stroke -  retinopathy +  neuropathy-sees podiatrist annually Dr Janit  +  nephropathy  BLOOD SUGAR DATA CGM interpretation: At today's visit, we reviewed her CGM downloads. The full report is scanned in the media. Reviewing the CGM trends, blood glucose is mildly elevated after break fast and some after supper.  Physical Exam  BP 120/60   Pulse 85   Ht 5' 6 (1.676 m)   Wt 198 lb (89.8 kg)   SpO2 98%   BMI 31.96 kg/m    Constitutional: well developed, well nourished Head: normocephalic, atraumatic Eyes: sclera anicteric, no redness Neck: supple Lungs: normal respiratory effort Neurology: alert and oriented Skin: dry, no appreciable rashes Musculoskeletal: no appreciable defects Psychiatric: normal mood and affect   Current Medications Patient's Medications  New Prescriptions   TIRZEPATIDE  (MOUNJARO ) 10 MG/0.5ML PEN    Inject 10 mg into the skin once a week.  Previous Medications   ALBUTEROL  (PROVENTIL ) (2.5 MG/3ML) 0.083% NEBULIZER SOLUTION    Take 3 mLs (2.5 mg total) by nebulization every 6 (six) hours as needed for wheezing or shortness of breath (Use 2-3 times per day when sick.).   ALBUTEROL  (VENTOLIN  HFA) 108 (90 BASE) MCG/ACT INHALER    Inhale 1-2  puffs into the lungs every 4 (four) hours as needed for wheezing or shortness of breath.   ASPIRIN  EC 81 MG TABLET    Take 1 tablet (81 mg total) by mouth daily.   ATORVASTATIN  (LIPITOR) 40 MG TABLET    Take 1 tablet (40 mg total) by mouth daily.   BENZONATATE  (TESSALON  PERLES) 100 MG CAPSULE    Take 1-2 capsules (100-200 mg total) by mouth 3 (three) times daily as needed for cough.   BREO ELLIPTA  200-25 MCG/INH AEPB    TAKE 1 PUFF BY MOUTH EVERY DAY   BUPROPION  (WELLBUTRIN  XL) 300 MG 24 HR TABLET    TAKE 1 TABLET BY MOUTH EVERY DAY   CALCIUM  CARBONATE (CALTRATE 600 PO)    Orally Once a day for 30 day(s)   CHLORPHENIRAMINE -HYDROCODONE  (TUSSIONEX) 10-8 MG/5ML    Take 5 mLs by mouth at bedtime as needed for cough (do not drive for 8 hours after taking).   CONTINUOUS GLUCOSE SENSOR (DEXCOM G7 SENSOR) MISC    1 DEVICE BY DOES NOT APPLY ROUTE AS DIRECTED. CHANGE SENSOR EVERY 10 DAYS   CYCLOBENZAPRINE  (FLEXERIL )  5 MG TABLET    Take 1-2 tablets (5-10 mg total) by mouth 3 (three) times daily as needed for muscle spasms (May cause drowsiness).   DILTIAZEM  (CARDIZEM  CD) 120 MG 24 HR CAPSULE    TAKE 1 CAPSULE BY MOUTH EVERY DAY   ESTRADIOL  (ESTRACE ) 0.1 MG/GM VAGINAL CREAM    Place 0.5 Applicatorfuls vaginally daily as needed (irritation).   FAMOTIDINE  (PEPCID ) 20 MG TABLET    TAKE 1 TABLET BY MOUTH TWICE A DAY   FENOFIBRATE  160 MG TABLET    TAKE 1 TABLET BY MOUTH EVERY DAY   FLUTICASONE  (FLONASE ) 50 MCG/ACT NASAL SPRAY    Place 2 sprays into both nostrils daily as needed for allergies.   FUROSEMIDE  (LASIX ) 40 MG TABLET    Take 1 tablet (40 mg total) by mouth daily.   GABAPENTIN  (NEURONTIN ) 100 MG CAPSULE    TAKE 1 CAPSULE BY MOUTH THREE TIMES A DAY   GLUCOSE BLOOD (CONTOUR NEXT TEST) TEST STRIP    1 each by Other route 4 (four) times daily. And lancets 4/day   GUAIFENESIN -DEXTROMETHORPHAN  (ROBITUSSIN DM) 100-10 MG/5ML SYRUP    Take 5 mLs by mouth every 4 (four) hours as needed for cough (chest congestion).    INSULIN  PEN NEEDLE (PEN NEEDLES) 32G X 4 MM MISC    1 each by Does not apply route 4 (four) times daily. E11.9   IPRATROPIUM (ATROVENT ) 0.06 % NASAL SPRAY    PLACE 2 SPRAYS INTO BOTH NOSTRILS 4 TIMES DAILY.   KLOR-CON  M20 20 MEQ TABLET    TAKE 1 & 1/2 TABLETS BY MOUTH TWICE A DAY   LANCET DEVICES (EASY MINI EJECT LANCING DEVICE) MISC    Use as directed 4 times daily to test blood sugar   LANTUS  SOLOSTAR 100 UNIT/ML SOLOSTAR PEN    Inject 24 Units into the skin daily.   METFORMIN  (GLUCOPHAGE -XR) 500 MG 24 HR TABLET    TAKE 4 TABLETS BY MOUTH DAILY WITH BREAKFAST.   MICROLET LANCETS MISC    1 each by Does not apply route 2 (two) times daily. E11.9   ONDANSETRON  (ZOFRAN -ODT) 4 MG DISINTEGRATING TABLET    Take 1 tablet (4 mg total) by mouth every 8 (eight) hours as needed for nausea or vomiting.   PANTOPRAZOLE  (PROTONIX ) 40 MG TABLET    TAKE 1 TABLET BY MOUTH TWICE A DAY   TELMISARTAN  (MICARDIS ) 40 MG TABLET    TAKE 1 TABLET BY MOUTH EVERY DAY   TRAMADOL  (ULTRAM ) 50 MG TABLET    Take 1 tablet (50 mg total) by mouth every 6 (six) hours as needed.   VENLAFAXINE  XR (EFFEXOR -XR) 75 MG 24 HR CAPSULE    TAKE 1 CAPSULE BY MOUTH TWICE A DAY  Modified Medications   No medications on file  Discontinued Medications   TIRZEPATIDE  (MOUNJARO ) 7.5 MG/0.5ML PEN    Inject 7.5 mg into the skin once a week.    Allergies Allergies  Allergen Reactions   Firvanq  [Vancomycin ] Other (See Comments)    Dose related nephrotoxicity    Methotrexate Other (See Comments)    Peri-oral and buccal lesions   Lisinopril  Cough   Chlorhexidine  Itching   Cleocin  [Clindamycin ] Nausea And Vomiting and Rash   Lincocin [Lincomycin Hcl] Nausea And Vomiting and Rash   Teflaro [Ceftaroline] Rash and Other (See Comments)    Tolerates ceftriaxone      Past Medical History Past Medical History:  Diagnosis Date   Abnormal SPEP 04/17/2014   Acute left ankle pain 01/26/2017  Allergy    ANEMIA-UNSPECIFIED 09/18/2009   Anxiety     Arthritis    Blood transfusion without reported diagnosis    Cataract    CHF (congestive heart failure) (HCC)    Chronic diastolic heart failure, NYHA class 2 (HCC)    Normal LVEDP by May 2018   COPD (chronic obstructive pulmonary disease) (HCC)    Depression    DIABETES MELLITUS, TYPE II 08/21/2006   Diabetic osteomyelitis (HCC) 05/29/2015   Fatty liver    Fracture of 5th metatarsal    non union   GERD 08/21/2006   Had fundoplication   GOUT 08/20/2010   Hammer toe of right foot    2-5th toes   Hx of umbilical hernia repair    HYPERLIPIDEMIA 08/21/2006   HYPERTENSION 08/21/2006   Infection of prosthetic left knee joint (HCC) 08/29/2020   Infection of wound due to methicillin resistant Staphylococcus aureus (MRSA)    Internal hemorrhoids    Kidney problem    Multiple allergies 10/14/2016   OBESITY 06/04/2009   Onychomycosis 10/27/2015   Osteomyelitis of left foot (HCC) 05/29/2015   Osteoporosis    Pulmonary sarcoidosis (HCC)    Followed locally by pulmonology, but also by [facility] Lavella at [facility]   Right knee pain 01/26/2017   Vocal cord dysfunction    Wears partial dentures     Past Surgical History Past Surgical History:  Procedure Laterality Date   ABDOMINAL HYSTERECTOMY     APPENDECTOMY     BLADDER SUSPENSION  11/11/2011   Procedure: TRANSVAGINAL TAPE (TVT) PROCEDURE;  Surgeon: Oneil FORBES Piety, MD;  Location: [facility];  Service: Gynecology;  Laterality: N/A;   CAROTIDS  02/18/2011   CAROTID DUPLEX; VERTEBRALS ARE PATENT WITH ANTEGRADE FLOW. ICA/CCA RATIO 1.61 ON RIGHT AND 0.75 ON LEFT   CATARACT EXTRACTION, BILATERAL     CHOLECYSTECTOMY  1984   COLONOSCOPY  04/29/2010   [facility]   CYSTOSCOPY  11/11/2011   Procedure: CYSTOSCOPY;  Surgeon: Oneil FORBES Piety, MD;  Location: [facility];  Service: Gynecology;  Laterality: N/A;   EXTUBATION (ENDOTRACHEAL) IN OR N/A 09/23/2017   Procedure: EXTUBATION (ENDOTRACHEAL) IN OR;  Surgeon: Terri Alan PARAS, MD;   Location: [facility];  Service: ENT;  Laterality: N/A;   FIBEROPTIC LARYNGOSCOPY AND TRACHEOSCOPY N/A 09/23/2017   Procedure: FLEXIBLE FIBEROPTIC LARYNGOSCOPY;  Surgeon: Terri Alan PARAS, MD;  Location: [facility];  Service: ENT;  Laterality: N/A;   FRACTURE SURGERY     foot   HALLUX FUSION Left 10/02/2018   Procedure: HALLUX ARTHRODESIS;  Surgeon: Janit Thresa HERO, DPM;  Location: [facility];  Service: Podiatry;  Laterality: Left;   HAMMER TOE SURGERY Left 10/02/2018   Procedure: HAMMER TOE CORRECTION 2ND 3RD 4RD FIFTH TOE;  Surgeon: Janit Thresa HERO, DPM;  Location: [facility];  Service: Podiatry;  Laterality: Left;   HAMMER TOE SURGERY Right 04/12/2019   Procedure: HAMMER TOE CORRECTION, 2ND, 3RD, 4TH AND 5TH TOES OF RIGHT FOOT;  Surgeon: Janit Thresa HERO, DPM;  Location[facility];  Service: Podiatry;  Laterality: Right;   HAMMER TOE SURGERY Left 10/25/2019   Procedure: HAMMER TOE REPAIR SECOND, THIRD, FOUTH;  Surgeon: Janit Thresa HERO, DPM;  Location: [facility];  Service: Podiatry;  Laterality: Left;   HERNIA REPAIR     I & D EXTREMITY Left 06/27/2015   Procedure: Partial Excision Left Calcaneus, Place Antibiotic Beads, and Wound VAC;  Surgeon: Jerona Harden GAILS, MD;  Location: [facility];  Service: Orthopedics;  Laterality: Left;  JOINT REPLACEMENT     KNEE ARTHROSCOPY     right   LAPAROSCOPIC ASSISTED VENTRAL HERNIA REPAIR N/A 03/26/2024   Procedure: REPAIR, HERNIA, VENTRAL, LAPAROSCOPY-ASSISTED;  Surgeon: Rubin Calamity, MD;  Location: [facility];  Service: General;  Laterality: N/A;  LAP VENTRAL HERNIA REPAIR WITH MESH   LAPAROSCOPIC LYSIS OF ADHESIONS N/A 03/26/2024   Procedure: LYSIS, ADHESIONS, LAPAROSCOPIC;  Surgeon: Rubin Calamity, MD;  Location[facility] OR;  Service: General;  Laterality: N/A;   LAPAROSCOPY N/A 03/26/2024   Procedure: LAPAROSCOPY, DIAGNOSTIC;  Surgeon: Rubin Calamity, MD;  Location: [facility] OR;  Service: General;  Laterality: N/A;   LEFT AND RIGHT HEART CATHETERIZATION WITH CORONARY ANGIOGRAM N/A 04/23/2013    Procedure: LEFT AND RIGHT HEART CATHETERIZATION WITH CORONARY ANGIOGRAM;  Surgeon: Alm LELON Clay, MD;  Location: [facility] CATH LAB;  Service: Cardiovascular;  Laterality: N/A;   LEFT HEART CATH AND CORONARY ANGIOGRAPHY N/A 03/11/2017   Procedure: Left Heart Cath and Coronary Angiography;  Surgeon: Wonda Sharper, MD;  Location: [facility] INVASIVE CV LAB; angiographically minimal CAD in the LAD otherwise normal.;  Normal LVEDP.  FALSE POSITIVE MYOVIEW    LEFT HEART CATH AND CORONARY ANGIOGRAPHY  07/20/2010   LVEF 50-55% WITH VERY MILD GLOBAL HYPOKINESIA; ESSENTIALLY NORMAL CORONARY ARTERIES; NORMAL LV FUNCTION   METATARSAL OSTEOTOMY WITH OPEN REDUCTION INTERNAL FIXATION (ORIF) METATARSAL WITH FUSION Left 04/09/2014   Procedure: LEFT FOOT FRACTURE OPEN TREATMENT METATARSAL INCLUDES INTERNAL FIXATION EACH;  Surgeon: Lamar DELENA Millman, MD;  Location: [facility];  Service: Orthopedics;  Laterality: Left;   NISSEN FUNDOPLICATION  2004   NM MYOVIEW  LTD --> FALSE POSITIVE  03/10/2017   : Moderate size stress-induced perfusion defect at the apex as well as ill-defined stress-induced perfusion defect in the lateral wall.  EF 55%.  INTERMEDIATE risk. -->  FALSE POSITIVE   ORIF DISTAL FEMUR FRACTURE  08/08/2020   [facility]: ORIF of left extra-articular periprosthetic distal femur fracture with synthesis of the femur plate with cortical nonlocking screws.  (Thought to be pathologic fracture due to osteoporosis) -> after a fall   ORIF FEMUR W/ PERI-IMPLANT  08/29/2020   [facility]) revision of left femur fracture ORIF plate fixation screws; culture positive for MSSA   Right and left CARDIAC CATHETERIZATION  04/23/2013   Angiographic normal coronaries; LVEDP 20 mmHg, PCWP 12-14 mmHg, RAP 12 mmHg.; Fick CO/CI 4.9/2.2   ROTATOR CUFF REPAIR Right 12/09/2021   SHOULDER ARTHROSCOPY Left 03/14/2019   Procedure: LEFT SHOULDER ARTHROSCOPY, DEBRIDEMENT, AND DECOMPRESSION;  Surgeon: Harden Jerona GAILS, MD;  Location[facility];  Service: Orthopedics;  Laterality: Left;   TOTAL KNEE ARTHROPLASTY Right 06/29/2017   Procedure: RIGHT TOTAL KNEE ARTHROPLASTY;  Surgeon: Harden Jerona GAILS, MD;  Location: [facility] OR;  Service: Orthopedics;  Laterality: Right;   TOTAL KNEE ARTHROPLASTY Left 12/07/2017   TOTAL KNEE ARTHROPLASTY Left 12/07/2017   Procedure: LEFT TOTAL KNEE ARTHROPLASTY;  Surgeon: Harden Jerona GAILS, MD;  Location: [facility] OR;  Service: Orthopedics;  Laterality: Left;   TRACHEOSTOMY TUBE PLACEMENT N/A 09/20/2017   Procedure: AWAKE INTUBATION WITH ANESTHESIA WITH VIDEO ASSISTANCE;  Surgeon: Terri Alan PARAS, MD;  Location[facility];  Service: ENT;  Laterality: N/A;   TRANSTHORACIC ECHOCARDIOGRAM  08/2014   Normal LV size and function.  Mild LVH.  EF 55-60%.  Normal regional wall motion.  GR 1 DD.  Normal RV size and function .   TRANSTHORACIC ECHOCARDIOGRAM  08/12/2020    [facility]): Normal LV size and  function.  EF 55 to 60%.  No RWM A.  Normal filling pattern.  Normal RV.  No significant valvular disease-stenosis or regurgitation.  No vegetation.   TUBAL LIGATION     with reversal in 1994   UPPER GASTROINTESTINAL ENDOSCOPY     VENTRAL HERNIA REPAIR      Family History family history includes Asthma in her mother; Breast cancer in her mother; COPD in her mother; Coronary artery disease in her father; Diabetes in her brother, brother, father, and mother; Emphysema in her mother; Heart attack in her maternal grandfather; Heart attack (age of onset: 85) in her father; Heart disease in her father; Heart failure in her father and mother; Hypertension in her father; Kidney disease in her mother; Lung cancer in her brother; Obesity in her father; Sarcoidosis in her maternal uncle; Sleep apnea in her father; Throat cancer in her father; Thyroid  disease in her mother.  Social History Social History   Socioeconomic History   Marital status: Married    Spouse name: INAARA TYE   Number  of children: 2   Years of education: 12   Highest education level: 12th grade  Occupational History   Occupation: DISABLED  Tobacco Use   Smoking status: Never   Smokeless tobacco: Never  Vaping Use   Vaping status: Never Used  Substance and Sexual Activity   Alcohol use: No   Drug use: No   Sexual activity: Yes    Birth control/protection: Surgical  Other Topics Concern   Not on file  Social History Narrative   Married 1994. 2 sons who both live close and 1 grandson.       Disability due to sarcoidosis. 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 [employer]   Social Drivers of Health   Financial Resource Strain: Low Risk  (04/05/2024)   Overall Financial Resource Strain (CARDIA)    Difficulty of Paying Living Expenses: Not hard at all  Food Insecurity: No Food Insecurity (04/05/2024)   Hunger Vital Sign    Worried About Running Out of Food in the Last Year: Never true    Ran Out of Food in the Last Year: Never true  Transportation Needs: No Transportation Needs (04/05/2024)   PRAPARE - Administrator, Civil Service (Medical): No    Lack of Transportation (Non-Medical): No  Physical Activity: Insufficiently Active (04/05/2024)   Exercise Vital Sign    Days of Exercise per Week: 3 days    Minutes of Exercise per Session: 30 min  Stress: No Stress Concern Present (04/05/2024)   [employer] of Occupational Health - Occupational Stress Questionnaire    Feeling of Stress: Not at all  Social Connections: Socially Integrated (04/05/2024)   Social Connection and Isolation Panel    Frequency of Communication with Friends and Family: More than three times a week    Frequency of Social Gatherings with Friends and Family: More than three times a week    Attends Religious Services: More than 4 times per year    Active Member of [employer] or  Organizations: Yes    Attends Banker Meetings: 1 to 4 times per year    Marital Status: Married  Catering manager Violence: Not At Risk (04/05/2024)   Humiliation, Afraid, Rape, and Kick questionnaire    Fear of Current or Ex-Partner: No    Emotionally  Abused: No    Physically Abused: No    Sexually Abused: No    Lab Results  Component Value Date   HGBA1C 6.4 (H) 03/14/2024   HGBA1C 6.9 (A) 01/18/2024   HGBA1C 6.6 (A) 10/20/2023   Lab Results  Component Value Date   CHOL 151 10/20/2023   Lab Results  Component Value Date   HDL 50 10/20/2023   Lab Results  Component Value Date   LDLCALC 76 10/20/2023   Lab Results  Component Value Date   TRIG 157 (H) 10/20/2023   Lab Results  Component Value Date   CHOLHDL 3.0 10/20/2023   Lab Results  Component Value Date   CREATININE 1.48 (H) 04/12/2024   Lab Results  Component Value Date   GFR 46.92 (L) 01/13/2024   Lab Results  Component Value Date   MICROALBUR 1.4 10/20/2023      Component Value Date/Time   NA 140 04/12/2024 0911   NA 139 10/20/2020 0000   NA 135 (L) 04/17/2014 1039   K 5.3 04/12/2024 0911   K 4.5 04/17/2014 1039   CL 103 04/12/2024 0911   CO2 27 04/12/2024 0911   CO2 26 04/17/2014 1039   GLUCOSE 119 (H) 04/12/2024 0911   GLUCOSE 300 (H) 04/17/2014 1039   GLUCOSE 150 (H) 09/14/2006 1033   BUN 24 04/12/2024 0911   BUN 10 10/20/2020 0000   BUN 13.9 04/17/2014 1039   CREATININE 1.48 (H) 04/12/2024 0911   CREATININE 0.8 04/17/2014 1039   CALCIUM  9.7 04/12/2024 0911   CALCIUM  10.0 04/17/2014 1039   PROT 6.8 03/20/2024 1440   PROT 6.7 03/31/2020 1316   PROT 6.8 04/17/2014 1039   ALBUMIN 3.9 03/14/2024 0937   ALBUMIN 4.6 03/31/2020 1316   ALBUMIN 3.7 04/17/2014 1039   AST 18 03/20/2024 1440   AST 8 04/17/2014 1039   ALT 14 03/20/2024 1440   ALT 20 04/17/2014 1039   ALKPHOS 56 03/14/2024 0937   ALKPHOS 82 04/17/2014 1039   BILITOT 0.7 03/20/2024 1440   BILITOT 0.3 03/31/2020  1316   BILITOT 0.45 04/17/2014 1039   GFRNONAA 53 (L) 03/26/2024 0610   GFRNONAA 71 08/21/2020 1404   GFRAA 96 10/20/2020 0000   GFRAA 82 08/21/2020 1404      Latest Ref Rng & Units 04/12/2024    9:11 AM 03/26/2024    6:10 AM 03/20/2024    2:40 PM  BMP  Glucose 65 - 99 mg/dL 880  815  886   BUN 7 - 25 mg/dL 24  17  22    Creatinine 0.50 - 1.05 mg/dL 8.51  8.83  8.22   BUN/Creat Ratio 6 - 22 (calc) 16   12   Sodium 135 - 146 mmol/L 140  141  136   Potassium 3.5 - 5.3 mmol/L 5.3  4.0  4.5   Chloride 98 - 110 mmol/L 103  106  99   CO2 20 - 32 mmol/L 27  26  28    Calcium  8.6 - 10.4 mg/dL 9.7  8.7  9.2        Component Value Date/Time   WBC 14.0 (H) 03/20/2024 1440   RBC 3.60 (L) 03/20/2024 1440   HGB 10.8 (L) 03/20/2024 1440   HGB 12.1 04/17/2014 1039   HCT 32.8 (L) 03/20/2024 1440   HCT 36.9 04/17/2014 1039   PLT 386 03/20/2024 1440   PLT 436 (H) 04/17/2014 1039   MCV 91.1 03/20/2024 1440   MCV 87.9 04/17/2014 1039   MCH  30.0 03/20/2024 1440   MCHC 32.9 03/20/2024 1440   RDW 12.0 03/20/2024 1440   RDW 14.5 04/17/2014 1039   LYMPHSABS 2.6 01/13/2024 0854   LYMPHSABS 2.2 04/17/2014 1039   MONOABS 0.6 01/13/2024 0854   MONOABS 0.8 04/17/2014 1039   EOSABS 392 03/20/2024 1440   EOSABS 0.1 04/17/2014 1039   BASOSABS 98 03/20/2024 1440   BASOSABS 0.0 04/17/2014 1039     Parts of this note may have been dictated using voice recognition software. There may be variances in spelling and vocabulary which are unintentional. Not all errors are proofread. Please notify the dino if any discrepancies are noted or if the meaning of any statement is not clear.

## 2024-04-18 NOTE — Patient Instructions (Signed)

## 2024-04-19 ENCOUNTER — Encounter: Payer: Self-pay | Admitting: "Endocrinology

## 2024-05-05 ENCOUNTER — Other Ambulatory Visit: Payer: Self-pay | Admitting: "Endocrinology

## 2024-05-05 ENCOUNTER — Ambulatory Visit
Admission: EM | Admit: 2024-05-05 | Discharge: 2024-05-05 | Disposition: A | Discharge status: Home / Self Care | Source: Ambulatory Visit | Attending: Family Medicine | Admitting: Family Medicine

## 2024-05-05 DIAGNOSIS — S61258A Open bite of other finger without damage to nail, initial encounter: Secondary | ICD-10-CM

## 2024-05-05 DIAGNOSIS — E1165 Type 2 diabetes mellitus with hyperglycemia: Secondary | ICD-10-CM

## 2024-05-05 DIAGNOSIS — W540XXA Bitten by dog, initial encounter: Secondary | ICD-10-CM

## 2024-05-05 MED ORDER — AMOXICILLIN-POT CLAVULANATE 875-125 MG PO TABS: 1.0000 | ORAL_TABLET | Freq: Two times a day (BID) | ORAL | 0 refills | Status: AC

## 2024-05-05 NOTE — ED Provider Notes (Signed)
 [facility]    CSN: [id number] Arrival date & time: 05/05/24  1154      History   Chief Complaint Chief Complaint  Patient presents with   Animal Bite    HPI [name] is a 63 y.o. female with a past medical history of arthritis, COPD, CHF, diabetes presents for dog bite.  Patient reports last night she was bitten by her son's dog while feeding of a hot dog.  Bite was on her left index finger.  States she cleaned it and but has had intermittent bleeding since then.  She takes 81 mg of aspirin  otherwise no other blood thinning medications.  States the dog is up-to-date on its vaccines including rabies.  Patient is up-to-date on her tetanus from 2021.  She denies any swelling, numbness or tingling.  No other concerns at this time.   Animal Bite   Past Medical History:  Diagnosis Date   Abnormal SPEP 04/17/2014   Acute left ankle pain 01/26/2017   Allergy    ANEMIA-UNSPECIFIED 09/18/2009   Anxiety    Arthritis    Blood transfusion without reported diagnosis    Cataract    CHF (congestive heart failure) (HCC)    Chronic diastolic heart failure, NYHA class 2 (HCC)    Normal LVEDP by May 2018   COPD (chronic obstructive pulmonary disease) (HCC)    Depression    DIABETES MELLITUS, TYPE II 08/21/2006   Diabetic osteomyelitis (HCC) 05/29/2015   Fatty liver    Fracture of 5th metatarsal    non union   GERD 08/21/2006   Had fundoplication   GOUT 08/20/2010   Hammer toe of right foot    2-5th toes   Hx of umbilical hernia repair    HYPERLIPIDEMIA 08/21/2006   HYPERTENSION 08/21/2006   Infection of prosthetic left knee joint (HCC) 08/29/2020   Infection of wound due to methicillin resistant Staphylococcus aureus (MRSA)    Internal hemorrhoids    Kidney problem    Multiple allergies 10/14/2016   OBESITY 06/04/2009   Onychomycosis 10/27/2015   Osteomyelitis of left foot (HCC) 05/29/2015   Osteoporosis    Pulmonary sarcoidosis (HCC)    Followed locally by  pulmonology, but also by Dr. Lavella at [facility]   Right knee pain 01/26/2017   Vocal cord dysfunction    Wears partial dentures     Patient Active Problem List   Diagnosis Date Noted   Impaired gait 04/05/2024   Decreased activities of daily living (ADL) 04/05/2024   Impaired functional mobility, balance, gait, and endurance 04/05/2024   Left carpal tunnel syndrome 03/24/2023   Arthritis of carpometacarpal (CMC) joint of left thumb 03/10/2023   Radial styloid tenosynovitis (de quervain) 03/10/2023   Trigger finger of left thumb 03/10/2023   Pain of left hand 04/19/2022   Preop cardiovascular exam 12/07/2021   Subacromial bursitis of right shoulder joint 08/03/2021   Facet arthropathy, cervical 06/29/2021   Foraminal stenosis of cervical region 06/29/2021   Neck pain, musculoskeletal 05/15/2021   Iron  deficiency anemia 10/01/2020   Postural dizziness with presyncope 09/30/2020   Benign essential hypertension 08/07/2020   Hypertriglyceridemia 04/02/2020   Aortic atherosclerosis (HCC) 02/14/2020   Nontraumatic complete tear of left rotator cuff    Diabetic neuropathy (HCC) 02/20/2019   Severe persistent asthma 01/04/2019   Subcortical microvascular ischemic occlusive disease 07/12/2018   Rapid palpitations 07/12/2018   Impingement of left ankle joint 06/26/2018   Constipation 04/21/2018   Total knee replacement status,  left 12/07/2017   Oropharyngeal dysphagia 09/20/2017   Epiglottitis    Chronic diastolic CHF (congestive heart failure) (HCC) 08/13/2017   Plantar fasciitis, right 07/13/2017   S/P total knee arthroplasty, right 06/29/2017   Osteoarthrosis, localized, primary, knee, right    Sprain of calcaneofibular ligament of right ankle 06/06/2017   Osteoarthritis of right knee 01/26/2017   Onychomycosis 10/27/2015   CKD (chronic kidney disease) stage 3, GFR 30-59 ml/min (HCC) 04/27/2015   MRSA (methicillin resistant staph aureus) culture positive 03/27/2015    History of osteomyelitis 03/27/2015   Fatty liver 09/30/2014   Hot flashes 07/15/2014   Abnormal SPEP 04/17/2014   Fracture of left leg 04/17/2014   Internal hemorrhoids    Pulmonary sarcoidosis (HCC)    Major depression in full remission (HCC)    Preoperative clearance 03/25/2014   Chronic bronchitis (HCC) in never smoker 09/18/2013   Obstructive chronic bronchitis with exacerbation (HCC) 09/18/2013   Chest pain 04/11/2013   Hypertensive heart disease with chronic diastolic congestive heart failure (HCC)    Solitary pulmonary nodule, on CT 02/2013 - stable over 2 years in 2015 02/20/2013   Gout 08/20/2010   Deficiency anemia 09/18/2009   Sleep apnea 04/21/2009   Sarcoidosis of lung (HCC) 04/10/2007   Hyperlipidemia associated with type 2 diabetes mellitus (HCC) 08/21/2006   Hypertension 08/21/2006   GERD 08/21/2006   Type II diabetes mellitus with renal manifestations (HCC) 08/21/2006    Past Surgical History:  Procedure Laterality Date   ABDOMINAL HYSTERECTOMY     APPENDECTOMY     BLADDER SUSPENSION  11/11/2011   Procedure: TRANSVAGINAL TAPE (TVT) PROCEDURE;  Surgeon: Oneil FORBES Piety, MD;  Location: [facility];  Service: Gynecology;  Laterality: N/A;   CAROTIDS  02/18/2011   CAROTID DUPLEX; VERTEBRALS ARE PATENT WITH ANTEGRADE FLOW. ICA/CCA RATIO 1.61 ON RIGHT AND 0.75 ON LEFT   CATARACT EXTRACTION, BILATERAL     CHOLECYSTECTOMY  1984   COLONOSCOPY  04/29/2010   [facility]   CYSTOSCOPY  11/11/2011   Procedure: CYSTOSCOPY;  Surgeon: Oneil FORBES Piety, MD;  Location: [facility];  Service: Gynecology;  Laterality: N/A;   EXTUBATION (ENDOTRACHEAL) IN OR N/A 09/23/2017   Procedure: EXTUBATION (ENDOTRACHEAL) IN OR;  Surgeon: Terri Alan PARAS, MD;  Location: [facility];  Service: ENT;  Laterality: N/A;   FIBEROPTIC LARYNGOSCOPY AND TRACHEOSCOPY N/A 09/23/2017   Procedure: FLEXIBLE FIBEROPTIC LARYNGOSCOPY;  Surgeon: Terri Alan PARAS, MD;  Location: [facility];  Service: ENT;  Laterality: N/A;    FRACTURE SURGERY     foot   HALLUX FUSION Left 10/02/2018   Procedure: HALLUX ARTHRODESIS;  Surgeon: Janit Thresa HERO, DPM;  Location: [facility];  Service: Podiatry;  Laterality: Left;   HAMMER TOE SURGERY Left 10/02/2018   Procedure: HAMMER TOE CORRECTION 2ND 3RD 4RD FIFTH TOE;  Surgeon: Janit Thresa HERO, DPM;  Location: [facility];  Service: Podiatry;  Laterality: Left;   HAMMER TOE SURGERY Right 04/12/2019   Procedure: HAMMER TOE CORRECTION, 2ND, 3RD, 4TH AND 5TH TOES OF RIGHT FOOT;  Surgeon: Janit Thresa HERO, DPM;  Location[facility];  Service: Podiatry;  Laterality: Right;   HAMMER TOE SURGERY Left 10/25/2019   Procedure: HAMMER TOE REPAIR SECOND, THIRD, FOUTH;  Surgeon: Janit Thresa HERO, DPM;  Location: [facility];  Service: Podiatry;  Laterality: Left;   HERNIA REPAIR     I & D EXTREMITY Left 06/27/2015   Procedure: Partial Excision Left Calcaneus, Place Antibiotic Beads, and Wound VAC;  Surgeon: Jerona Harden GAILS, MD;  Location:  [facility];  Service: Orthopedics;  Laterality: Left;   JOINT REPLACEMENT     KNEE ARTHROSCOPY     right   LAPAROSCOPIC ASSISTED VENTRAL HERNIA REPAIR N/A 03/26/2024   Procedure: REPAIR, HERNIA, VENTRAL, LAPAROSCOPY-ASSISTED;  Surgeon: Rubin Calamity, MD;  Location: [facility];  Service: General;  Laterality: N/A;  LAP VENTRAL HERNIA REPAIR WITH MESH   LAPAROSCOPIC LYSIS OF ADHESIONS N/A 03/26/2024   Procedure: LYSIS, ADHESIONS, LAPAROSCOPIC;  Surgeon: Rubin Calamity, MD;  Location: [facility] OR;  Service: General;  Laterality: N/A;   LAPAROSCOPY N/A 03/26/2024   Procedure: LAPAROSCOPY, DIAGNOSTIC;  Surgeon: Rubin Calamity, MD;  Location: [facility] OR;  Service: General;  Laterality: N/A;   LEFT AND RIGHT HEART CATHETERIZATION WITH CORONARY ANGIOGRAM N/A 04/23/2013   Procedure: LEFT AND RIGHT HEART CATHETERIZATION WITH CORONARY ANGIOGRAM;  Surgeon: Alm LELON Clay, MD;  Location: [facility] CATH LAB;  Service: Cardiovascular;  Laterality: N/A;   LEFT HEART CATH AND CORONARY ANGIOGRAPHY N/A 03/11/2017   Procedure:  Left Heart Cath and Coronary Angiography;  Surgeon: Wonda Sharper, MD;  Location: [facility] INVASIVE CV LAB; angiographically minimal CAD in the LAD otherwise normal.;  Normal LVEDP.  FALSE POSITIVE MYOVIEW    LEFT HEART CATH AND CORONARY ANGIOGRAPHY  07/20/2010   LVEF 50-55% WITH VERY MILD GLOBAL HYPOKINESIA; ESSENTIALLY NORMAL CORONARY ARTERIES; NORMAL LV FUNCTION   METATARSAL OSTEOTOMY WITH OPEN REDUCTION INTERNAL FIXATION (ORIF) METATARSAL WITH FUSION Left 04/09/2014   Procedure: LEFT FOOT FRACTURE OPEN TREATMENT METATARSAL INCLUDES INTERNAL FIXATION EACH;  Surgeon: Lamar DELENA Millman, MD;  Location: [facility];  Service: Orthopedics;  Laterality: Left;   NISSEN FUNDOPLICATION  2004   NM MYOVIEW  LTD --> FALSE POSITIVE  03/10/2017   : Moderate size stress-induced perfusion defect at the apex as well as ill-defined stress-induced perfusion defect in the lateral wall.  EF 55%.  INTERMEDIATE risk. -->  FALSE POSITIVE   ORIF DISTAL FEMUR FRACTURE  08/08/2020   [facility]: ORIF of left extra-articular periprosthetic distal femur fracture with synthesis of the femur plate with cortical nonlocking screws.  (Thought to be pathologic fracture due to osteoporosis) -> after a fall   ORIF FEMUR W/ PERI-IMPLANT  08/29/2020   [facility]) revision of left femur fracture ORIF plate fixation screws; culture positive for MSSA   Right and left CARDIAC CATHETERIZATION  04/23/2013   Angiographic normal coronaries; LVEDP 20 mmHg, PCWP 12-14 mmHg, RAP 12 mmHg.; Fick CO/CI 4.9/2.2   ROTATOR CUFF REPAIR Right 12/09/2021   SHOULDER ARTHROSCOPY Left 03/14/2019   Procedure: LEFT SHOULDER ARTHROSCOPY, DEBRIDEMENT, AND DECOMPRESSION;  Surgeon: Harden Jerona GAILS, MD;  Location[facility];  Service: Orthopedics;  Laterality: Left;   TOTAL KNEE ARTHROPLASTY Right 06/29/2017   Procedure: RIGHT TOTAL KNEE ARTHROPLASTY;  Surgeon: Harden Jerona GAILS, MD;  Location: [facility] OR;  Service: Orthopedics;  Laterality:  Right;   TOTAL KNEE ARTHROPLASTY Left 12/07/2017   TOTAL KNEE ARTHROPLASTY Left 12/07/2017   Procedure: LEFT TOTAL KNEE ARTHROPLASTY;  Surgeon: Harden Jerona GAILS, MD;  Location: [facility] OR;  Service: Orthopedics;  Laterality: Left;   TRACHEOSTOMY TUBE PLACEMENT N/A 09/20/2017   Procedure: AWAKE INTUBATION WITH ANESTHESIA WITH VIDEO ASSISTANCE;  Surgeon: Terri Alan PARAS, MD;  Location: [facility];  Service: ENT;  Laterality: N/A;   TRANSTHORACIC ECHOCARDIOGRAM  08/2014   Normal LV size and function.  Mild LVH.  EF 55-60%.  Normal regional wall motion.  GR 1 DD.  Normal RV size and function .   TRANSTHORACIC ECHOCARDIOGRAM  08/12/2020  [facility]): Normal LV size and function.  EF 55 to 60%.  No RWM A.  Normal filling pattern.  Normal RV.  No significant valvular disease-stenosis or regurgitation.  No vegetation.   TUBAL LIGATION     with reversal in 1994   UPPER GASTROINTESTINAL ENDOSCOPY     VENTRAL HERNIA REPAIR      OB History     Gravida  2   Para  2   Term      Preterm      AB      Living  2      SAB      IAB      Ectopic      Multiple      Live Births               Home Medications    Prior to Admission medications   Medication Sig Start Date End Date Taking? Authorizing Provider  amoxicillin -clavulanate (AUGMENTIN ) 875-125 MG tablet Take 1 tablet by mouth every 12 (twelve) hours for 5 days. 05/05/24 05/10/24 Yes Liahm Grivas, Jodi R, NP  albuterol  (PROVENTIL ) (2.5 MG/3ML) 0.083% nebulizer solution Take 3 mLs (2.5 mg total) by nebulization every 6 (six) hours as needed for wheezing or shortness of breath (Use 2-3 times per day when sick.). 10/11/23   Lucius Krabbe, NP  albuterol  (VENTOLIN  HFA) 108 (90 Base) MCG/ACT inhaler Inhale 1-2 puffs into the lungs every 4 (four) hours as needed for wheezing or shortness of breath. 01/02/24   Job Lukes, PA  aspirin  EC 81 MG tablet Take 1 tablet (81 mg total) by mouth daily. 11/03/20   Anner Alm ORN, MD   atorvastatin  (LIPITOR) 40 MG tablet Take 1 tablet (40 mg total) by mouth daily. 09/19/23   Katrinka Garnette KIDD, MD  benzonatate  (TESSALON  PERLES) 100 MG capsule Take 1-2 capsules (100-200 mg total) by mouth 3 (three) times daily as needed for cough. 01/02/24   Job Lukes, PA  BREO ELLIPTA  200-25 MCG/INH AEPB TAKE 1 PUFF BY MOUTH EVERY DAY 06/04/19   Katrinka Garnette KIDD, MD  buPROPion  (WELLBUTRIN  XL) 300 MG 24 hr tablet TAKE 1 TABLET BY MOUTH EVERY DAY 01/11/24   Katrinka Garnette KIDD, MD  Calcium  Carbonate (CALTRATE 600 PO) Orally Once a day for 30 day(s)    [provider]  chlorpheniramine -HYDROcodone  (TUSSIONEX) 10-8 MG/5ML Take 5 mLs by mouth at bedtime as needed for cough (do not drive for 8 hours after taking). 08/03/23   Kennyth Worth HERO, MD  Continuous Glucose Sensor (DEXCOM G7 SENSOR) MISC 1 DEVICE BY DOES NOT APPLY ROUTE AS DIRECTED. CHANGE SENSOR EVERY 10 DAYS 04/16/24   Dartha Ernst, MD  cyclobenzaprine  (FLEXERIL ) 5 MG tablet Take 1-2 tablets (5-10 mg total) by mouth 3 (three) times daily as needed for muscle spasms (May cause drowsiness). 04/16/24   Lucius Krabbe, NP  diltiazem  (CARDIZEM  CD) 120 MG 24 hr capsule TAKE 1 CAPSULE BY MOUTH EVERY DAY 03/21/24   Goodrich, Callie E, PA-C  estradiol  (ESTRACE ) 0.1 MG/GM vaginal cream Place 0.5 Applicatorfuls vaginally daily as needed (irritation).    [provider]  famotidine  (PEPCID ) 20 MG tablet TAKE 1 TABLET BY MOUTH TWICE A DAY 02/08/24   Katrinka Garnette KIDD, MD  fenofibrate  160 MG tablet TAKE 1 TABLET BY MOUTH EVERY DAY 10/10/23   Anner Alm ORN, MD  fluticasone  (FLONASE ) 50 MCG/ACT nasal spray Place 2 sprays into both nostrils daily as needed for allergies. 08/11/23   Von Bellis, MD  furosemide  (LASIX ) 40 MG tablet Take 1 tablet (40 mg total) by mouth daily. 02/20/24   Katrinka Garnette KIDD, MD  gabapentin  (NEURONTIN ) 100 MG capsule TAKE 1 CAPSULE BY MOUTH THREE TIMES A DAY 09/06/23   Katrinka Garnette KIDD, MD  glucose blood  (CONTOUR NEXT TEST) test strip 1 each by Other route 4 (four) times daily. And lancets 4/day 09/19/19   Kassie Mallick, MD  guaiFENesin -dextromethorphan  (ROBITUSSIN DM) 100-10 MG/5ML syrup Take 5 mLs by mouth every 4 (four) hours as needed for cough (chest congestion). 08/11/23   Von Bellis, MD  Insulin  Pen Needle (PEN NEEDLES) 32G X 4 MM MISC 1 each by Does not apply route 4 (four) times daily. E11.9 02/22/20   Kassie Mallick, MD  ipratropium (ATROVENT ) 0.06 % nasal spray PLACE 2 SPRAYS INTO BOTH NOSTRILS 4 TIMES DAILY. 05/11/23   Katrinka Garnette KIDD, MD  KLOR-CON  M20 20 MEQ tablet TAKE 1 & 1/2 TABLETS BY MOUTH TWICE A DAY 10/14/23   Katrinka Garnette KIDD, MD  Lancet Devices (EASY MINI EJECT LANCING DEVICE) MISC Use as directed 4 times daily to test blood sugar 11/04/20   Kassie Mallick, MD  LANTUS  SOLOSTAR 100 UNIT/ML Solostar Pen Inject 24 Units into the skin daily.    [provider]  metFORMIN  (GLUCOPHAGE -XR) 500 MG 24 hr tablet TAKE 4 TABLETS BY MOUTH DAILY WITH BREAKFAST. 02/02/24   Motwani, Komal, MD  Microlet Lancets MISC 1 each by Does not apply route 2 (two) times daily. E11.9 10/03/23   Motwani, Komal, MD  ondansetron  (ZOFRAN -ODT) 4 MG disintegrating tablet Take 1 tablet (4 mg total) by mouth every 8 (eight) hours as needed for nausea or vomiting. 03/21/24   Job Lukes, PA  pantoprazole  (PROTONIX ) 40 MG tablet TAKE 1 TABLET BY MOUTH TWICE A DAY 04/06/24   Abran Norleen SAILOR, MD  telmisartan  (MICARDIS ) 40 MG tablet TAKE 1 TABLET BY MOUTH EVERY DAY 12/19/23   Katrinka Garnette KIDD, MD  tirzepatide  (MOUNJARO ) 10 MG/0.5ML Pen Inject 10 mg into the skin once a week. 04/18/24   Motwani, Komal, MD  traMADol  (ULTRAM ) 50 MG tablet Take 1 tablet (50 mg total) by mouth every 6 (six) hours as needed. 03/26/24 03/26/25  Rubin Calamity, MD  venlafaxine  XR (EFFEXOR -XR) 75 MG 24 hr capsule TAKE 1 CAPSULE BY MOUTH TWICE A DAY 04/10/24   Katrinka Garnette KIDD, MD  mupirocin  nasal ointment (BACTROBAN ) 2 % Place 1 application  into the nose 2 (two) times daily. Use one-half of tube in each nostril twice daily for five (5) days. After application, press sides of nose together and gently massage. 03/28/18 03/28/18  Katrinka Garnette KIDD, MD    Family History Family History  Problem Relation Age of Onset   Diabetes Father    Heart attack Father 47   Coronary artery disease Father    Heart failure Father    Throat cancer Father    Hypertension Father    Heart disease Father    Sleep apnea Father    Obesity Father    COPD Mother    Emphysema Mother    Asthma Mother    Heart failure Mother    Breast cancer Mother    Diabetes Mother    Kidney disease Mother    Thyroid  disease Mother    Heart attack Maternal Grandfather    Sarcoidosis Maternal Uncle    Lung cancer Brother    Diabetes Brother    Diabetes Brother    Colon cancer Neg Hx  Colon polyps Neg Hx    Esophageal cancer Neg Hx    Rectal cancer Neg Hx    Stomach cancer Neg Hx     Social History Social History   Tobacco Use   Smoking status: Never   Smokeless tobacco: Never  Vaping Use   Vaping status: Never Used  Substance Use Topics   Alcohol use: No   Drug use: No     Allergies   Firvanq  [vancomycin ], Methotrexate, Lisinopril , Chlorhexidine , Cleocin  [clindamycin ], Lincocin [lincomycin hcl], and Teflaro [ceftaroline]   Review of Systems Review of Systems  Skin:  Positive for wound.     Physical Exam Triage Vital Signs ED Triage Vitals  Encounter Vitals Group     BP 05/05/24 1204 (!) 120/57     Girls Systolic BP Percentile --      Girls Diastolic BP Percentile --      Boys Systolic BP Percentile --      Boys Diastolic BP Percentile --      Pulse Rate 05/05/24 1204 78     Resp 05/05/24 1204 15     Temp 05/05/24 1204 98.7 F (37.1 C)     Temp Source 05/05/24 1204 Oral     SpO2 05/05/24 1204 97 %     Weight --      Height --      Head Circumference --      Peak Flow --      Pain Score 05/05/24 1203 5     Pain Loc --       Pain Education --      Exclude from Growth Chart --    No data found.  Updated Vital Signs BP (!) 120/57 (BP Location: Left Arm)   Pulse 78   Temp 98.7 F (37.1 C) (Oral)   Resp 15   SpO2 97%   Visual Acuity Right Eye Distance:   Left Eye Distance:   Bilateral Distance:    Right Eye Near:   Left Eye Near:    Bilateral Near:     Physical Exam Vitals and nursing note reviewed.  Constitutional:      General: She is not in acute distress.    Appearance: Normal appearance. She is not ill-appearing.  HENT:     Head: Normocephalic and atraumatic.  Eyes:     Pupils: Pupils are equal, round, and reactive to light.  Cardiovascular:     Rate and Rhythm: Normal rate.  Pulmonary:     Effort: Pulmonary effort is normal.  Musculoskeletal:       Hands:     Comments: Superficial dog bite to palmar aspect of left distal index finger.  No active bleeding.  No tendon involvement.  Edges well-approximated.  No swelling drainage.  DP +2.  See photo  Skin:    General: Skin is warm and dry.  Neurological:     General: No focal deficit present.     Mental Status: She is alert and oriented to person, place, and time.  Psychiatric:        Mood and Affect: Mood normal.        Behavior: Behavior normal.      UC Treatments / Results  Labs (all labs ordered are listed, but only abnormal results are displayed) Labs Reviewed - No data to display  Basic metabolic panel with GFR Order: [id number]  Status: Final result     Next appt: 05/21/2024 at 10:40 AM in Endocrinology Douglass Birmingham, MD)     Dx:  Uncontrolled type 2 diabetes mellitus...   Test Result Released: Yes (seen)   0 Result Notes     1 HM Topic          Component Ref Range & Units (hover) 3 wk ago (04/12/24) 1 mo ago (03/26/24) 1 mo ago (03/20/24) 1 mo ago (03/14/24) 3 mo ago (02/01/24) 3 mo ago (01/13/24) 5 mo ago (12/05/23)  Glucose, Bld 119 High  184 High  R, CM 113 High  CM 147 High  R, CM 134 High  R, CM 158 High  R 148  High  R  Comment: .            Fasting reference interval . For someone without known diabetes, a glucose value between 100 and 125 mg/dL is consistent with prediabetes and should be confirmed with a follow-up test. .  BUN 24 17 R 22 22 R 19 R 21 R 26 High  R  Creat 1.48 High  1.16 High  R 1.77 High  1.63 High  R 1.34 High  R 1.22 High  R 1.21 High  R  eGFR 39 Low   32 Low       BUN/Creatinine Ratio 16  12      Sodium 140 141 R 136 137 R 140 R 138 R 137 R  Potassium 5.3 4.0 R 4.5 4.7 R 4.3 R 4.9 R 4.9 R  Chloride 103 106 R 99 103 R 103 R 102 R 101 R  CO2 27 26 R 28 27 R 21 Low  R 28 R 28 R  Calcium  9.7 8.7 Low  R 9.2 9.4 R 9.6 R 9.5 R 9.7 R  Resulting Agency [facility] [place] [facility] [place] [facility] [place] [facility] [place] [facility]         Resulting Agency's Comment  Performing Organization Information:     Site [facility]ENNERT (CLIA: [id number])     Name: ORLIN SUMNER MORITA     Address: [address] [place], [place] [address]     Director: ROMERO RAV MD  Specimen Collected: 04/12/24 09:11 Last Resulted: 04/12/24 23:21    EKG   Radiology No results found.  Procedures Procedures (including critical care time)  Medications Ordered in UC Medications - No data to display  Initial Impression / Assessment and Plan / UC Course  I have reviewed the triage vital signs and the nursing notes.  Pertinent labs & imaging results that were available during my care of the patient were reviewed by me and considered in my medical decision making (see chart for details).     Reviewed exam and symptoms with patient.  No red flags.  Wound cleansed and dressed by nursing staff.  Labs reviewed, creatinine clearance 54 mL/min, start Augmentin  to prevent infection.  No dosage adjustment indicated.  Patient is up-to-date on her tetanus and dog is up-to-date on its rabies vaccine.  Discussed signs symptoms of infection as well as  wound care.  PCP follow-up if symptoms do not improve.  ER precautions reviewed. Final Clinical Impressions(s) / UC Diagnoses   Final diagnoses:  Dog bite of index finger, initial encounter     Discharge Instructions      Keep wound clean and dry.  Start Augmentin  twice daily for 5 days to help prevent infection.  Monitor for any signs of infection including redness, drainage, swelling, fevers or chills and please seek reevaluation in the emergency room if these occur.  Please follow-up  with your PCP if your symptoms are not improving.  Hope you feel better soon!     ED Prescriptions     Medication Sig Dispense Auth. Provider   amoxicillin -clavulanate (AUGMENTIN ) 875-125 MG tablet Take 1 tablet by mouth every 12 (twelve) hours for 5 days. 10 tablet Fernande Treiber, Jodi R, NP      PDMP not reviewed this encounter.   Loreda Myla SAUNDERS, NP 05/05/24 [phone]

## 2024-05-05 NOTE — Discharge Instructions (Addendum)
 Keep wound clean and dry.  Start Augmentin  twice daily for 5 days to help prevent infection.  Monitor for any signs of infection including redness, drainage, swelling, fevers or chills and please seek reevaluation in the emergency room if these occur.  Please follow-up with your PCP if your symptoms are not improving.  Hope you feel better soon!

## 2024-05-05 NOTE — ED Triage Notes (Addendum)
 Pt present with a dog bite to the lt index finger. Pt was bitten yesterday by her son dog. She reports the dog is vaccinated. Pt states she has not stopped bleeding since last night, is not on blood thinners.

## 2024-05-08 NOTE — Telephone Encounter (Signed)
 Requested Prescriptions   Pending Prescriptions Disp Refills   metFORMIN  (GLUCOPHAGE -XR) 500 MG 24 hr tablet [Pharmacy Med Name: METFORMIN  HCL ER 500 MG TABLET] 360 tablet 0    Sig: TAKE 4 TABLETS BY MOUTH DAILY WITH BREAKFAST.

## 2024-05-11 LAB — HM DIABETES EYE EXAM

## 2024-05-21 ENCOUNTER — Encounter: Payer: Self-pay | Admitting: "Endocrinology

## 2024-05-21 ENCOUNTER — Ambulatory Visit (INDEPENDENT_AMBULATORY_CARE_PROVIDER_SITE_OTHER): Admitting: "Endocrinology

## 2024-05-21 VITALS — BP 130/70 | Ht 66.0 in | Wt 196.0 lb

## 2024-05-21 DIAGNOSIS — Z7985 Long-term (current) use of injectable non-insulin antidiabetic drugs: Secondary | ICD-10-CM | POA: Diagnosis not present

## 2024-05-21 DIAGNOSIS — Z794 Long term (current) use of insulin: Secondary | ICD-10-CM | POA: Diagnosis not present

## 2024-05-21 DIAGNOSIS — Z7984 Long term (current) use of oral hypoglycemic drugs: Secondary | ICD-10-CM | POA: Diagnosis not present

## 2024-05-21 DIAGNOSIS — E1165 Type 2 diabetes mellitus with hyperglycemia: Secondary | ICD-10-CM | POA: Diagnosis not present

## 2024-05-21 DIAGNOSIS — E782 Mixed hyperlipidemia: Secondary | ICD-10-CM

## 2024-05-21 MED ORDER — TIRZEPATIDE 12.5 MG/0.5ML ~~LOC~~ SOAJ
12.5000 mg | SUBCUTANEOUS | 0 refills | Status: DC
Start: 2024-05-21 — End: 2024-08-21

## 2024-05-21 MED ORDER — METFORMIN HCL ER 500 MG PO TB24: 500.0000 mg | ORAL_TABLET | Freq: Two times a day (BID) | ORAL | 0 refills | Status: AC

## 2024-05-21 NOTE — Patient Instructions (Addendum)
 Will recommend the following: Metformin  XR 500 mg 2 pills a day, mounjaro  12.5 mg/week Lantus  20-22 units at am, cut by 2 units if blood sugar is less than 100  _________   Goals of DM therapy:  Morning Fasting blood sugar: 80-140  Blood sugar before meals: 80-140 Bed time blood sugar: 100-150  A1C <7%, limited only by hypoglycemia  1.Diabetes medications and their side effects discussed, including hypoglycemia    2. Check blood glucose:  a) Always check blood sugars before driving. Please see below (under hypoglycemia) on how to manage b) Check a minimum of 3 times/day or more as needed when having symptoms of hypoglycemia.   c) Try to check blood glucose before sleeping/in the middle of the night to ensure that it is remaining stable and not dropping less than 100 d) Check blood glucose more often if sick  3. Diet: a) 3 meals per day schedule b: Restrict carbs to 60-70 grams (4 servings) per meal c) Colorful vegetables - 3 servings a day, and low sugar fruit 2 servings/day Plate control method: 1/4 plate protein, 1/4 starch, 1/2 green, yellow, or red vegetables d) Avoid carbohydrate snacks unless hypoglycemic episode, or increased physical activity  4. Regular exercise as tolerated, preferably 3 or more hours a week  5. Hypoglycemia: a)  Do not drive or operate machinery without first testing blood glucose to assure it is over 90 mg%, or if dizzy, lightheaded, not feeling normal, etc, or  if foot or leg is numb or weak. b)  If blood glucose less than 70, take four 5gm Glucose tabs or 15-30 gm Glucose gel.  Repeat every 15 min as needed until blood sugar is >100 mg/dl. If hypoglycemia persists then call 911.   6. Sick day management: a) Check blood glucose more often b) Continue usual therapy if blood sugars are elevated.   7. Contact the doctor immediately if blood glucose is frequently <60 mg/dl, or an episode of severe hypoglycemia occurs (where someone had to give you  glucose/  glucagon or if you passed out from a low blood glucose), or if blood glucose is persistently >350 mg/dl, for further management  8. A change in level of physical activity or exercise and a change in diet may also affect your blood sugar. Check blood sugars more often and call if needed.  Instructions: 1. Bring glucose meter, blood glucose records on every visit for review 2. Continue to follow up with primary care physician and other providers for medical care 3. Yearly eye  and foot exam 4. Please get blood work done prior to the next appointment

## 2024-05-21 NOTE — Progress Notes (Signed)
 Outpatient Endocrinology Note Obadiah Birmingham, MD  05/21/24   [name] [birth year] [id number]  Referring Provider: Katrinka Garnette KIDD, MD Primary Care Provider: Katrinka Garnette KIDD, MD Reason for consultation: Subjective   Assessment & Plan  Diagnoses and all orders for this visit:  Uncontrolled type 2 diabetes mellitus with hyperglycemia, with long-term current use of insulin  (HCC) -     metFORMIN  (GLUCOPHAGE -XR) 500 MG 24 hr tablet; Take 1 tablet (500 mg total) by mouth 2 (two) times daily with a meal. -     Basic metabolic panel with GFR  Long term (current) use of oral hypoglycemic drugs  Long-term (current) use of injectable non-insulin  antidiabetic drugs  Long-term insulin  use (HCC)  Mixed hypercholesterolemia and hypertriglyceridemia  Other orders -     tirzepatide  (MOUNJARO ) 12.5 MG/0.5ML Pen; Inject 12.5 mg into the skin once a week.   Diabetes complicated by nephropathy Hba1c goal less than 7.0, current Hba1c is  Lab Results  Component Value Date   HGBA1C 6.4 (H) 03/14/2024   HGBA1C 6.9 (A) 01/18/2024   HGBA1C 6.6 (A) 10/20/2023    Will recommend the following: Metformin  XR 500 mg 2 pills a day, mounjaro  12.5 mg/week Lantus  20-22 units at am, cut by 2 units if blood sugar is less than 100 BMP Q3 mo to check GFR (pt knows to cut metformin  to 1000 mg every day if GFR drops <45 and stop ti if GFR <30)  12/2023 Stopped premeal NovoLog  07-22-13 fifteen min before meals due to HYPOGLYCEMIA, as low as 40s Stopped Trulicity  4.5 mg weekly due to poor weight loss Got sick with ozempic   No known contraindications to any of above medications  Hyperlipidemia -Last LDL at goal: 56, Tg 154 -on atorvastatin  40 mg QD and fenofibrate  160 mg qd -Follow low fat diet and exercise   -Blood pressure goal <140/90 - Microalbumin/creatinine at goal < 30 - on ACE/ARB Telmisartan  40 mg qd -diet changes including salt restriction -limit eating outside -counseled  BP targets per standards of diabetes care -Uncontrolled blood pressure can lead to retinopathy, nephropathy and cardiovascular and atherosclerotic heart disease  Reviewed and counseled on: -A1C target -Blood sugar targets -Complications of uncontrolled diabetes  -Checking blood sugar before meals and bedtime and bring log next visit -All medications with mechanism of action and side effects -Hypoglycemia management: rule of 15's, Glucagon Emergency Kit and medical alert ID -low-carb low-fat plate-method diet -At least 20 minutes of physical activity per day -Annual dilated retinal eye exam and foot exam -compliance and follow up needs -follow up as scheduled or earlier if problem gets worse  Call if blood sugar is less than 70 or consistently above 250    Take a 15 gm snack of carbohydrate at bedtime before you go to sleep if your blood sugar is less than 100.    If you are going to fast after midnight for a test or procedure, ask your physician for instructions on how to reduce/decrease your insulin  dose.    Call if blood sugar is less than 70 or consistently above 250  -Treating a low sugar by rule of 15  (15 gms of sugar every 15 min until sugar is more than 70) If you feel your sugar is low, test your sugar to be sure If your sugar is low (less than 70), then take 15 grams of a fast acting Carbohydrate (3-4 glucose tablets or glucose gel or 4 ounces of juice or regular soda) Recheck your sugar  15 min after treating low to make sure it is more than 70 If sugar is still less than 70, treat again with 15 grams of carbohydrate          Don't drive the hour of hypoglycemia  If unconscious/unable to eat or drink by mouth, use glucagon injection or nasal spray baqsimi and call 911. Can repeat again in 15 min if still unconscious.  Return in about 3 months (around 08/21/2024) for visit + labs before next visit.   I have reviewed current medications, nurse's notes, allergies, vital  signs, past medical and surgical history, family medical history, and social history for this encounter. Counseled patient on symptoms, examination findings, lab findings, imaging results, treatment decisions and monitoring and prognosis. The patient understood the recommendations and agrees with the treatment plan. All questions regarding treatment plan were fully answered.  Obadiah Birmingham, MD  05/21/24   History of Present Illness [name] is a 63 y.o. year old female who presents for evaluation of Type 2 diabetes mellitus.  [name] was first diagnosed in 2007.    Home diabetes regimen: Metformin  2000 mg a day, Lantus  24 units at am, Mounjaro  10mg /week    Stopped Premeal NovoLog  07-22-13 units tidac   Stopped Trulicity  4.5 mg weekly, likes mounjaro  due to weight loss   Previous history:  Non-insulin  hypoglycemic drugs previously used: Ozempic , metformin  Insulin  was started a few years after diagnosis Side effects from medications: Ozempic : nausea  COMPLICATIONS -  MI/Stroke -  retinopathy +  neuropathy-sees podiatrist annually Dr Janit  +  nephropathy  BLOOD SUGAR DATA CGM interpretation: At today's visit, we reviewed her CGM downloads. The full report is scanned in the media. Reviewing the CGM trends, blood glucose is mildly elevated at noon, otherwise mostly well controlled.  Physical Exam  BP 130/70   Ht 5' 6 (1.676 m)   Wt 196 lb (88.9 kg)   BMI 31.64 kg/m    Constitutional: well developed, well nourished Head: normocephalic, atraumatic Eyes: sclera anicteric, no redness Neck: supple Lungs: normal respiratory effort Neurology: alert and oriented Skin: dry, no appreciable rashes Musculoskeletal: no appreciable defects Psychiatric: normal mood and affect   Current Medications Patient's Medications  New Prescriptions   TIRZEPATIDE  (MOUNJARO ) 12.5 MG/0.5ML PEN    Inject 12.5 mg into the skin once a week.  Previous Medications   ALBUTEROL   (PROVENTIL ) (2.5 MG/3ML) 0.083% NEBULIZER SOLUTION    Take 3 mLs (2.5 mg total) by nebulization every 6 (six) hours as needed for wheezing or shortness of breath (Use 2-3 times per day when sick.).   ALBUTEROL  (VENTOLIN  HFA) 108 (90 BASE) MCG/ACT INHALER    Inhale 1-2 puffs into the lungs every 4 (four) hours as needed for wheezing or shortness of breath.   ASPIRIN  EC 81 MG TABLET    Take 1 tablet (81 mg total) by mouth daily.   ATORVASTATIN  (LIPITOR) 40 MG TABLET    Take 1 tablet (40 mg total) by mouth daily.   BENZONATATE  (TESSALON  PERLES) 100 MG CAPSULE    Take 1-2 capsules (100-200 mg total) by mouth 3 (three) times daily as needed for cough.   BREO ELLIPTA  200-25 MCG/INH AEPB    TAKE 1 PUFF BY MOUTH EVERY DAY   BUPROPION  (WELLBUTRIN  XL) 300 MG 24 HR TABLET    TAKE 1 TABLET BY MOUTH EVERY DAY   CALCIUM  CARBONATE (CALTRATE 600 PO)    Orally Once a day for 30 day(s)   CHLORPHENIRAMINE -HYDROCODONE  (TUSSIONEX) 10-8  MG/5ML    Take 5 mLs by mouth at bedtime as needed for cough (do not drive for 8 hours after taking).   CONTINUOUS GLUCOSE SENSOR (DEXCOM G7 SENSOR) MISC    1 DEVICE BY DOES NOT APPLY ROUTE AS DIRECTED. CHANGE SENSOR EVERY 10 DAYS   CYCLOBENZAPRINE  (FLEXERIL ) 5 MG TABLET    Take 1-2 tablets (5-10 mg total) by mouth 3 (three) times daily as needed for muscle spasms (May cause drowsiness).   DILTIAZEM  (CARDIZEM  CD) 120 MG 24 HR CAPSULE    TAKE 1 CAPSULE BY MOUTH EVERY DAY   ESTRADIOL  (ESTRACE ) 0.1 MG/GM VAGINAL CREAM    Place 0.5 Applicatorfuls vaginally daily as needed (irritation).   FAMOTIDINE  (PEPCID ) 20 MG TABLET    TAKE 1 TABLET BY MOUTH TWICE A DAY   FENOFIBRATE  160 MG TABLET    TAKE 1 TABLET BY MOUTH EVERY DAY   FLUTICASONE  (FLONASE ) 50 MCG/ACT NASAL SPRAY    Place 2 sprays into both nostrils daily as needed for allergies.   FUROSEMIDE  (LASIX ) 40 MG TABLET    Take 1 tablet (40 mg total) by mouth daily.   GABAPENTIN  (NEURONTIN ) 100 MG CAPSULE    TAKE 1 CAPSULE BY MOUTH THREE TIMES A  DAY   GLUCOSE BLOOD (CONTOUR NEXT TEST) TEST STRIP    1 each by Other route 4 (four) times daily. And lancets 4/day   GUAIFENESIN -DEXTROMETHORPHAN  (ROBITUSSIN DM) 100-10 MG/5ML SYRUP    Take 5 mLs by mouth every 4 (four) hours as needed for cough (chest congestion).   INSULIN  PEN NEEDLE (PEN NEEDLES) 32G X 4 MM MISC    1 each by Does not apply route 4 (four) times daily. E11.9   IPRATROPIUM (ATROVENT ) 0.06 % NASAL SPRAY    PLACE 2 SPRAYS INTO BOTH NOSTRILS 4 TIMES DAILY.   KLOR-CON  M20 20 MEQ TABLET    TAKE 1 & 1/2 TABLETS BY MOUTH TWICE A DAY   LANCET DEVICES (EASY MINI EJECT LANCING DEVICE) MISC    Use as directed 4 times daily to test blood sugar   LANTUS  SOLOSTAR 100 UNIT/ML SOLOSTAR PEN    Inject 24 Units into the skin daily.   MICROLET LANCETS MISC    1 each by Does not apply route 2 (two) times daily. E11.9   ONDANSETRON  (ZOFRAN -ODT) 4 MG DISINTEGRATING TABLET    Take 1 tablet (4 mg total) by mouth every 8 (eight) hours as needed for nausea or vomiting.   PANTOPRAZOLE  (PROTONIX ) 40 MG TABLET    TAKE 1 TABLET BY MOUTH TWICE A DAY   TELMISARTAN  (MICARDIS ) 40 MG TABLET    TAKE 1 TABLET BY MOUTH EVERY DAY   TRAMADOL  (ULTRAM ) 50 MG TABLET    Take 1 tablet (50 mg total) by mouth every 6 (six) hours as needed.   VENLAFAXINE  XR (EFFEXOR -XR) 75 MG 24 HR CAPSULE    TAKE 1 CAPSULE BY MOUTH TWICE A DAY  Modified Medications   Modified Medication Previous Medication   METFORMIN  (GLUCOPHAGE -XR) 500 MG 24 HR TABLET metFORMIN  (GLUCOPHAGE -XR) 500 MG 24 hr tablet      Take 1 tablet (500 mg total) by mouth 2 (two) times daily with a meal.    TAKE 4 TABLETS BY MOUTH DAILY WITH BREAKFAST.  Discontinued Medications   TIRZEPATIDE  (MOUNJARO ) 10 MG/0.5ML PEN    Inject 10 mg into the skin once a week.    Allergies Allergies  Allergen Reactions   Firvanq  [Vancomycin ] Other (See Comments)    Dose related nephrotoxicity  Methotrexate Other (See Comments)    Peri-oral and buccal lesions   Lisinopril  Cough    Chlorhexidine  Itching   Cleocin  [Clindamycin ] Nausea And Vomiting and Rash   Lincocin [Lincomycin Hcl] Nausea And Vomiting and Rash   Teflaro [Ceftaroline] Rash and Other (See Comments)    Tolerates ceftriaxone      Past Medical History Past Medical History:  Diagnosis Date   Abnormal SPEP 04/17/2014   Acute left ankle pain 01/26/2017   Allergy    ANEMIA-UNSPECIFIED 09/18/2009   Anxiety    Arthritis    Blood transfusion without reported diagnosis    Cataract    CHF (congestive heart failure) (HCC)    Chronic diastolic heart failure, NYHA class 2 (HCC)    Normal LVEDP by May 2018   COPD (chronic obstructive pulmonary disease) (HCC)    Depression    DIABETES MELLITUS, TYPE II 08/21/2006   Diabetic osteomyelitis (HCC) 05/29/2015   Fatty liver    Fracture of 5th metatarsal    non union   GERD 08/21/2006   Had fundoplication   GOUT 08/20/2010   Hammer toe of right foot    2-5th toes   Hx of umbilical hernia repair    HYPERLIPIDEMIA 08/21/2006   HYPERTENSION 08/21/2006   Infection of prosthetic left knee joint (HCC) 08/29/2020   Infection of wound due to methicillin resistant Staphylococcus aureus (MRSA)    Internal hemorrhoids    Kidney problem    Multiple allergies 10/14/2016   OBESITY 06/04/2009   Onychomycosis 10/27/2015   Osteomyelitis of left foot (HCC) 05/29/2015   Osteoporosis    Pulmonary sarcoidosis (HCC)    Followed locally by pulmonology, but also by Dr. Lavella at [facility]   Right knee pain 01/26/2017   Vocal cord dysfunction    Wears partial dentures     Past Surgical History Past Surgical History:  Procedure Laterality Date   ABDOMINAL HYSTERECTOMY     APPENDECTOMY     BLADDER SUSPENSION  11/11/2011   Procedure: TRANSVAGINAL TAPE (TVT) PROCEDURE;  Surgeon: Oneil FORBES Piety, MD;  Location: [facility];  Service: Gynecology;  Laterality: N/A;   CAROTIDS  02/18/2011   CAROTID DUPLEX; VERTEBRALS ARE PATENT WITH ANTEGRADE FLOW. ICA/CCA RATIO 1.61  ON RIGHT AND 0.75 ON LEFT   CATARACT EXTRACTION, BILATERAL     CHOLECYSTECTOMY  1984   COLONOSCOPY  04/29/2010   [facility]   CYSTOSCOPY  11/11/2011   Procedure: CYSTOSCOPY;  Surgeon: Oneil FORBES Piety, MD;  Location: [facility];  Service: Gynecology;  Laterality: N/A;   EXTUBATION (ENDOTRACHEAL) IN OR N/A 09/23/2017   Procedure: EXTUBATION (ENDOTRACHEAL) IN OR;  Surgeon: Terri Alan PARAS, MD;  Location: [facility];  Service: ENT;  Laterality: N/A;   FIBEROPTIC LARYNGOSCOPY AND TRACHEOSCOPY N/A 09/23/2017   Procedure: FLEXIBLE FIBEROPTIC LARYNGOSCOPY;  Surgeon: Terri Alan PARAS, MD;  Location: [facility];  Service: ENT;  Laterality: N/A;   FRACTURE SURGERY     foot   HALLUX FUSION Left 10/02/2018   Procedure: HALLUX ARTHRODESIS;  Surgeon: Janit Thresa HERO, DPM;  Location: [facility];  Service: Podiatry;  Laterality: Left;   HAMMER TOE SURGERY Left 10/02/2018   Procedure: HAMMER TOE CORRECTION 2ND 3RD 4RD FIFTH TOE;  Surgeon: Janit Thresa HERO, DPM;  Location: [facility];  Service: Podiatry;  Laterality: Left;   HAMMER TOE SURGERY Right 04/12/2019   Procedure: HAMMER TOE CORRECTION, 2ND, 3RD, 4TH AND 5TH TOES OF RIGHT FOOT;  Surgeon: Janit Thresa HERO, DPM;  Location: [facility];  Service: Podiatry;  Laterality: Right;   HAMMER TOE SURGERY Left 10/25/2019   Procedure: HAMMER TOE REPAIR SECOND, THIRD, FOUTH;  Surgeon: Janit Thresa HERO, DPM;  Location: [facility];  Service: Podiatry;  Laterality: Left;   HERNIA REPAIR     I & D EXTREMITY Left 06/27/2015   Procedure: Partial Excision Left Calcaneus, Place Antibiotic Beads, and Wound VAC;  Surgeon: Jerona Harden GAILS, MD;  Location: [facility];  Service: Orthopedics;  Laterality: Left;   JOINT REPLACEMENT     KNEE ARTHROSCOPY     right   LAPAROSCOPIC ASSISTED VENTRAL HERNIA REPAIR N/A 03/26/2024   Procedure: REPAIR, HERNIA, VENTRAL, LAPAROSCOPY-ASSISTED;  Surgeon: Rubin Calamity, MD;  Location: [facility];  Service: General;  Laterality: N/A;  LAP VENTRAL HERNIA REPAIR WITH MESH   LAPAROSCOPIC  LYSIS OF ADHESIONS N/A 03/26/2024   Procedure: LYSIS, ADHESIONS, LAPAROSCOPIC;  Surgeon: Rubin Calamity, MD;  Location[facility] OR;  Service: General;  Laterality: N/A;   LAPAROSCOPY N/A 03/26/2024   Procedure: LAPAROSCOPY, DIAGNOSTIC;  Surgeon: Rubin Calamity, MD;  Location: [facility] OR;  Service: General;  Laterality: N/A;   LEFT AND RIGHT HEART CATHETERIZATION WITH CORONARY ANGIOGRAM N/A 04/23/2013   Procedure: LEFT AND RIGHT HEART CATHETERIZATION WITH CORONARY ANGIOGRAM;  Surgeon: Alm LELON Clay, MD;  Location: [facility] CATH LAB;  Service: Cardiovascular;  Laterality: N/A;   LEFT HEART CATH AND CORONARY ANGIOGRAPHY N/A 03/11/2017   Procedure: Left Heart Cath and Coronary Angiography;  Surgeon: Wonda Sharper, MD;  Location: [facility] INVASIVE CV LAB; angiographically minimal CAD in the LAD otherwise normal.;  Normal LVEDP.  FALSE POSITIVE MYOVIEW    LEFT HEART CATH AND CORONARY ANGIOGRAPHY  07/20/2010   LVEF 50-55% WITH VERY MILD GLOBAL HYPOKINESIA; ESSENTIALLY NORMAL CORONARY ARTERIES; NORMAL LV FUNCTION   METATARSAL OSTEOTOMY WITH OPEN REDUCTION INTERNAL FIXATION (ORIF) METATARSAL WITH FUSION Left 04/09/2014   Procedure: LEFT FOOT FRACTURE OPEN TREATMENT METATARSAL INCLUDES INTERNAL FIXATION EACH;  Surgeon: Lamar DELENA Millman, MD;  Location: [facility];  Service: Orthopedics;  Laterality: Left;   NISSEN FUNDOPLICATION  2004   NM MYOVIEW  LTD --> FALSE POSITIVE  03/10/2017   : Moderate size stress-induced perfusion defect at the apex as well as ill-defined stress-induced perfusion defect in the lateral wall.  EF 55%.  INTERMEDIATE risk. -->  FALSE POSITIVE   ORIF DISTAL FEMUR FRACTURE  08/08/2020   [facility]: ORIF of left extra-articular periprosthetic distal femur fracture with synthesis of the femur plate with cortical nonlocking screws.  (Thought to be pathologic fracture due to osteoporosis) -> after a fall   ORIF FEMUR W/ PERI-IMPLANT  08/29/2020   [facility]) revision  of left femur fracture ORIF plate fixation screws; culture positive for MSSA   Right and left CARDIAC CATHETERIZATION  04/23/2013   Angiographic normal coronaries; LVEDP 20 mmHg, PCWP 12-14 mmHg, RAP 12 mmHg.; Fick CO/CI 4.9/2.2   ROTATOR CUFF REPAIR Right 12/09/2021   SHOULDER ARTHROSCOPY Left 03/14/2019   Procedure: LEFT SHOULDER ARTHROSCOPY, DEBRIDEMENT, AND DECOMPRESSION;  Surgeon: Harden Jerona GAILS, MD;  Location: [facility];  Service: Orthopedics;  Laterality: Left;   TOTAL KNEE ARTHROPLASTY Right 06/29/2017   Procedure: RIGHT TOTAL KNEE ARTHROPLASTY;  Surgeon: Harden Jerona GAILS, MD;  Location: [facility] OR;  Service: Orthopedics;  Laterality: Right;   TOTAL KNEE ARTHROPLASTY Left 12/07/2017   TOTAL KNEE ARTHROPLASTY Left 12/07/2017   Procedure: LEFT TOTAL KNEE ARTHROPLASTY;  Surgeon: Harden Jerona GAILS, MD;  Location: [facility] OR;  Service: Orthopedics;  Laterality: Left;   TRACHEOSTOMY TUBE PLACEMENT N/A  09/20/2017   Procedure: AWAKE INTUBATION WITH ANESTHESIA WITH VIDEO ASSISTANCE;  Surgeon: Terri Alan PARAS, MD;  Location: [facility];  Service: ENT;  Laterality: N/A;   TRANSTHORACIC ECHOCARDIOGRAM  08/2014   Normal LV size and function.  Mild LVH.  EF 55-60%.  Normal regional wall motion.  GR 1 DD.  Normal RV size and function .   TRANSTHORACIC ECHOCARDIOGRAM  08/12/2020    [facility]): Normal LV size and function.  EF 55 to 60%.  No RWM A.  Normal filling pattern.  Normal RV.  No significant valvular disease-stenosis or regurgitation.  No vegetation.   TUBAL LIGATION     with reversal in 1994   UPPER GASTROINTESTINAL ENDOSCOPY     VENTRAL HERNIA REPAIR      Family History family history includes Asthma in her mother; Breast cancer in her mother; COPD in her mother; Coronary artery disease in her father; Diabetes in her brother, brother, father, and mother; Emphysema in her mother; Heart attack in her maternal grandfather; Heart attack (age of onset: 36) in her father; Heart disease in her father;  Heart failure in her father and mother; Hypertension in her father; Kidney disease in her mother; Lung cancer in her brother; Obesity in her father; Sarcoidosis in her maternal uncle; Sleep apnea in her father; Throat cancer in her father; Thyroid  disease in her mother.  Social History Social History   Socioeconomic History   Marital status: Married    Spouse name: LUNDON ROSIER   Number of children: 2   Years of education: 12   Highest education level: 12th grade  Occupational History   Occupation: DISABLED  Tobacco Use   Smoking status: Never   Smokeless tobacco: Never  Vaping Use   Vaping status: Never Used  Substance and Sexual Activity   Alcohol use: No   Drug use: No   Sexual activity: Yes    Birth control/protection: Surgical  Other Topics Concern   Not on file  Social History Narrative   Married 1994. 2 sons who both live close and 1 grandson.       Disability due to sarcoidosis. 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 [employer]   Social Drivers of Health   Financial Resource Strain: Low Risk  (04/05/2024)   Overall Financial Resource Strain (CARDIA)    Difficulty of Paying Living Expenses: Not hard at all  Food Insecurity: No Food Insecurity (04/05/2024)   Hunger Vital Sign    Worried About Running Out of Food in the Last Year: Never true    Ran Out of Food in the Last Year: Never true  Transportation Needs: No Transportation Needs (04/05/2024)   PRAPARE - Administrator, Civil Service (Medical): No    Lack of Transportation (Non-Medical): No  Physical Activity: Insufficiently Active (04/05/2024)   Exercise Vital Sign    Days of Exercise per Week: 3 days    Minutes of Exercise per Session: 30 min  Stress: No Stress Concern Present (04/05/2024)   [employer] of Occupational Health - Occupational Stress  Questionnaire    Feeling of Stress: Not at all  Social Connections: Socially Integrated (04/05/2024)   Social Connection and Isolation Panel    Frequency of Communication with Friends and Family: More than three times a week    Frequency of Social Gatherings with Friends  and Family: More than three times a week    Attends Religious Services: More than 4 times per year    Active Member of Clubs or Organizations: Yes    Attends Club or Organization Meetings: 1 to 4 times per year    Marital Status: Married  Catering manager Violence: Not At Risk (04/05/2024)   Humiliation, Afraid, Rape, and Kick questionnaire    Fear of Current or Ex-Partner: No    Emotionally Abused: No    Physically Abused: No    Sexually Abused: No    Lab Results  Component Value Date   HGBA1C 6.4 (H) 03/14/2024   HGBA1C 6.9 (A) 01/18/2024   HGBA1C 6.6 (A) 10/20/2023   Lab Results  Component Value Date   CHOL 151 10/20/2023   Lab Results  Component Value Date   HDL 50 10/20/2023   Lab Results  Component Value Date   LDLCALC 76 10/20/2023   Lab Results  Component Value Date   TRIG 157 (H) 10/20/2023   Lab Results  Component Value Date   CHOLHDL 3.0 10/20/2023   Lab Results  Component Value Date   CREATININE 1.48 (H) 04/12/2024   Lab Results  Component Value Date   GFR 46.92 (L) 01/13/2024   Lab Results  Component Value Date   MICROALBUR 1.4 10/20/2023      Component Value Date/Time   NA 140 04/12/2024 0911   NA 139 10/20/2020 0000   NA 135 (L) 04/17/2014 1039   K 5.3 04/12/2024 0911   K 4.5 04/17/2014 1039   CL 103 04/12/2024 0911   CO2 27 04/12/2024 0911   CO2 26 04/17/2014 1039   GLUCOSE 119 (H) 04/12/2024 0911   GLUCOSE 300 (H) 04/17/2014 1039   GLUCOSE 150 (H) 09/14/2006 1033   BUN 24 04/12/2024 0911   BUN 10 10/20/2020 0000   BUN 13.9 04/17/2014 1039   CREATININE 1.48 (H) 04/12/2024 0911   CREATININE 0.8 04/17/2014 1039   CALCIUM  9.7 04/12/2024 0911   CALCIUM  10.0  04/17/2014 1039   PROT 6.8 03/20/2024 1440   PROT 6.7 03/31/2020 1316   PROT 6.8 04/17/2014 1039   ALBUMIN 3.9 03/14/2024 0937   ALBUMIN 4.6 03/31/2020 1316   ALBUMIN 3.7 04/17/2014 1039   AST 18 03/20/2024 1440   AST 8 04/17/2014 1039   ALT 14 03/20/2024 1440   ALT 20 04/17/2014 1039   ALKPHOS 56 03/14/2024 0937   ALKPHOS 82 04/17/2014 1039   BILITOT 0.7 03/20/2024 1440   BILITOT 0.3 03/31/2020 1316   BILITOT 0.45 04/17/2014 1039   GFRNONAA 53 (L) 03/26/2024 0610   GFRNONAA 71 08/21/2020 1404   GFRAA 96 10/20/2020 0000   GFRAA 82 08/21/2020 1404      Latest Ref Rng & Units 04/12/2024    9:11 AM 03/26/2024    6:10 AM 03/20/2024    2:40 PM  BMP  Glucose 65 - 99 mg/dL 880  815  886   BUN 7 - 25 mg/dL 24  17  22    Creatinine 0.50 - 1.05 mg/dL 8.51  8.83  8.22   BUN/Creat Ratio 6 - 22 (calc) 16   12   Sodium 135 - 146 mmol/L 140  141  136   Potassium 3.5 - 5.3 mmol/L 5.3  4.0  4.5   Chloride 98 - 110 mmol/L 103  106  99   CO2 20 - 32 mmol/L 27  26  28    Calcium  8.6 - 10.4 mg/dL 9.7  8.7  9.2        Component Value Date/Time   WBC 14.0 (H) 03/20/2024 1440   RBC 3.60 (L) 03/20/2024 1440   HGB 10.8 (L) 03/20/2024 1440   HGB 12.1 04/17/2014 1039   HCT 32.8 (L) 03/20/2024 1440   HCT 36.9 04/17/2014 1039   PLT 386 03/20/2024 1440   PLT 436 (H) 04/17/2014 1039   MCV 91.1 03/20/2024 1440   MCV 87.9 04/17/2014 1039   MCH 30.0 03/20/2024 1440   MCHC 32.9 03/20/2024 1440   RDW 12.0 03/20/2024 1440   RDW 14.5 04/17/2014 1039   LYMPHSABS 2.6 01/13/2024 0854   LYMPHSABS 2.2 04/17/2014 1039   MONOABS 0.6 01/13/2024 0854   MONOABS 0.8 04/17/2014 1039   EOSABS 392 03/20/2024 1440   EOSABS 0.1 04/17/2014 1039   BASOSABS 98 03/20/2024 1440   BASOSABS 0.0 04/17/2014 1039     Parts of this note may have been dictated using voice recognition software. There may be variances in spelling and vocabulary which are unintentional. Not all errors are proofread. Please notify the dino if  any discrepancies are noted or if the meaning of any statement is not clear.

## 2024-05-25 ENCOUNTER — Ambulatory Visit: Admitting: Adult Health

## 2024-06-01 ENCOUNTER — Encounter: Payer: Self-pay | Admitting: Family

## 2024-06-01 ENCOUNTER — Ambulatory Visit: Admitting: Family

## 2024-06-01 VITALS — BP 124/73 | HR 77 | Temp 97.7°F | Ht 66.0 in | Wt 190.8 lb

## 2024-06-01 DIAGNOSIS — R300 Dysuria: Secondary | ICD-10-CM | POA: Diagnosis not present

## 2024-06-01 LAB — POCT URINALYSIS DIPSTICK
Bilirubin, UA: NEGATIVE
Blood, UA: NEGATIVE
Glucose, UA: NEGATIVE
Ketones, UA: NEGATIVE
Nitrite, UA: NEGATIVE
Protein, UA: NEGATIVE
Spec Grav, UA: 1.02 (ref 1.010–1.025)
Urobilinogen, UA: 0.2 U/dL
pH, UA: 6 (ref 5.0–8.0)

## 2024-06-01 MED ORDER — PHENAZOPYRIDINE HCL 100 MG PO TABS: 100.0000 mg | ORAL_TABLET | Freq: Three times a day (TID) | ORAL | 0 refills | Status: DC | PRN

## 2024-06-01 NOTE — Progress Notes (Signed)
 Patient ID: [name], female    DOB: [birth year], 63 y.o.   MRN: [id number]  Chief Complaint  Patient presents with   Dysuria    Pt c/o painful urination, Pelvic pain, flank pain and urinary urgency. Present for a couple of days.  Discussed the use of AI scribe software for clinical note transcription with the patient, who gave verbal consent to proceed.  History of Present Illness   [name] is a 63 year old female who presents with recurrent urinary symptoms.  Lower urinary tract symptoms - Dysuria, urgency, and increased frequency of urination, similar to symptoms experienced in June - Urinates every five to twenty minutes - Recent onset of dark urine - Current urinalysis shows trace leukocytes - No infection identified during previous episode in June despite antibiotic treatment - Aware that caffeine worsens symptoms - Drinks at least four glasses of water  daily - Concerned about managing symptoms during upcoming trip, leaving Tuesday.    Assessment & Plan:     Dysuria, urinary frequency, and urgency Recurrent symptoms with small leukocytes on urinalysis suggest possible non-infectious etiology, as occurred in June after culture results. Want to avoid over prescribing of antibiotics. - Prescribed Pyridium  TID until tablets as needed for burning for 3 days, advised on SE. - Sent urine culture to confirm infection, will notify of results hopefully Monday. - Advised increased fluid intake, especially with caffeine consumption, at least 2.5L water  qd. - Call back if sx are not improving.    Subjective:    Outpatient Medications Prior to Visit  Medication Sig Dispense Refill   albuterol  (PROVENTIL ) (2.5 MG/3ML) 0.083% nebulizer solution Take 3 mLs (2.5 mg total) by nebulization every 6 (six) hours as needed for wheezing or shortness of breath (Use 2-3 times per day when sick.). 75 mL 2   albuterol  (VENTOLIN  HFA) 108 (90 Base) MCG/ACT inhaler Inhale 1-2 puffs into  the lungs every 4 (four) hours as needed for wheezing or shortness of breath. 8.5 each 1   aspirin  EC 81 MG tablet Take 1 tablet (81 mg total) by mouth daily. 30 tablet 11   atorvastatin  (LIPITOR) 40 MG tablet Take 1 tablet (40 mg total) by mouth daily. 90 tablet 3   benzonatate  (TESSALON  PERLES) 100 MG capsule Take 1-2 capsules (100-200 mg total) by mouth 3 (three) times daily as needed for cough. 30 capsule 3   BREO ELLIPTA  200-25 MCG/INH AEPB TAKE 1 PUFF BY MOUTH EVERY DAY 60 each 5   buPROPion  (WELLBUTRIN  XL) 300 MG 24 hr tablet TAKE 1 TABLET BY MOUTH EVERY DAY 90 tablet 3   Calcium  Carbonate (CALTRATE 600 PO) Orally Once a day for 30 day(s)     chlorpheniramine -HYDROcodone  (TUSSIONEX) 10-8 MG/5ML Take 5 mLs by mouth at bedtime as needed for cough (do not drive for 8 hours after taking). 115 mL 0   Continuous Glucose Sensor (DEXCOM G7 SENSOR) MISC 1 DEVICE BY DOES NOT APPLY ROUTE AS DIRECTED. CHANGE SENSOR EVERY 10 DAYS 3 each 3   cyclobenzaprine  (FLEXERIL ) 5 MG tablet Take 1-2 tablets (5-10 mg total) by mouth 3 (three) times daily as needed for muscle spasms (May cause drowsiness). 30 tablet 0   diltiazem  (CARDIZEM  CD) 120 MG 24 hr capsule TAKE 1 CAPSULE BY MOUTH EVERY DAY 90 capsule 3   estradiol  (ESTRACE ) 0.1 MG/GM vaginal cream Place 0.5 Applicatorfuls vaginally daily as needed (irritation).     famotidine  (PEPCID ) 20 MG tablet TAKE 1 TABLET BY MOUTH TWICE A DAY  180 tablet 1   fenofibrate  160 MG tablet TAKE 1 TABLET BY MOUTH EVERY DAY 90 tablet 1   fluticasone  (FLONASE ) 50 MCG/ACT nasal spray Place 2 sprays into both nostrils daily as needed for allergies. 48 mL 3   furosemide  (LASIX ) 40 MG tablet Take 1 tablet (40 mg total) by mouth daily. 90 tablet 3   gabapentin  (NEURONTIN ) 100 MG capsule TAKE 1 CAPSULE BY MOUTH THREE TIMES A DAY 270 capsule 3   glucose blood (CONTOUR NEXT TEST) test strip 1 each by Other route 4 (four) times daily. And lancets 4/day 400 each 3    guaiFENesin -dextromethorphan  (ROBITUSSIN DM) 100-10 MG/5ML syrup Take 5 mLs by mouth every 4 (four) hours as needed for cough (chest congestion). 118 mL 0   Insulin  Pen Needle (PEN NEEDLES) 32G X 4 MM MISC 1 each by Does not apply route 4 (four) times daily. E11.9 400 each 0   ipratropium (ATROVENT ) 0.06 % nasal spray PLACE 2 SPRAYS INTO BOTH NOSTRILS 4 TIMES DAILY. 45 mL 3   KLOR-CON  M20 20 MEQ tablet TAKE 1 & 1/2 TABLETS BY MOUTH TWICE A DAY 270 tablet 2   Lancet Devices (EASY MINI EJECT LANCING DEVICE) MISC Use as directed 4 times daily to test blood sugar 1 each 3   LANTUS  SOLOSTAR 100 UNIT/ML Solostar Pen Inject 24 Units into the skin daily.     metFORMIN  (GLUCOPHAGE -XR) 500 MG 24 hr tablet Take 1 tablet (500 mg total) by mouth 2 (two) times daily with a meal. 180 tablet 0   Microlet Lancets MISC 1 each by Does not apply route 2 (two) times daily. E11.9 100 each 2   ondansetron  (ZOFRAN -ODT) 4 MG disintegrating tablet Take 1 tablet (4 mg total) by mouth every 8 (eight) hours as needed for nausea or vomiting. 20 tablet 0   pantoprazole  (PROTONIX ) 40 MG tablet TAKE 1 TABLET BY MOUTH TWICE A DAY 180 tablet 1   telmisartan  (MICARDIS ) 40 MG tablet TAKE 1 TABLET BY MOUTH EVERY DAY 90 tablet 3   tirzepatide  (MOUNJARO ) 12.5 MG/0.5ML Pen Inject 12.5 mg into the skin once a week. 6 mL 0   traMADol  (ULTRAM ) 50 MG tablet Take 1 tablet (50 mg total) by mouth every 6 (six) hours as needed. 20 tablet 0   venlafaxine  XR (EFFEXOR -XR) 75 MG 24 hr capsule TAKE 1 CAPSULE BY MOUTH TWICE A DAY 180 capsule 3   No facility-administered medications prior to visit.   Past Medical History:  Diagnosis Date   Abnormal SPEP 04/17/2014   Acute left ankle pain 01/26/2017   Allergy    ANEMIA-UNSPECIFIED 09/18/2009   Anxiety    Arthritis    Blood transfusion without reported diagnosis    Cataract    CHF (congestive heart failure) (HCC)    Chronic diastolic heart failure, NYHA class 2 (HCC)    Normal LVEDP by May 2018    COPD (chronic obstructive pulmonary disease) (HCC)    Depression    DIABETES MELLITUS, TYPE II 08/21/2006   Diabetic osteomyelitis (HCC) 05/29/2015   Fatty liver    Fracture of 5th metatarsal    non union   GERD 08/21/2006   Had fundoplication   GOUT 08/20/2010   Hammer toe of right foot    2-5th toes   Hx of umbilical hernia repair    HYPERLIPIDEMIA 08/21/2006   HYPERTENSION 08/21/2006   Infection of prosthetic left knee joint (HCC) 08/29/2020   Infection of wound due to methicillin resistant Staphylococcus aureus (MRSA)  Internal hemorrhoids    Kidney problem    Multiple allergies 10/14/2016   OBESITY 06/04/2009   Onychomycosis 10/27/2015   Osteomyelitis of left foot (HCC) 05/29/2015   Osteoporosis    Pulmonary sarcoidosis (HCC)    Followed locally by pulmonology, but also by Dr. Lavella at [facility]   Right knee pain 01/26/2017   Vocal cord dysfunction    Wears partial dentures    Past Surgical History:  Procedure Laterality Date   ABDOMINAL HYSTERECTOMY     APPENDECTOMY     BLADDER SUSPENSION  11/11/2011   Procedure: TRANSVAGINAL TAPE (TVT) PROCEDURE;  Surgeon: Oneil FORBES Piety, MD;  Location: [facility];  Service: Gynecology;  Laterality: N/A;   CAROTIDS  02/18/2011   CAROTID DUPLEX; VERTEBRALS ARE PATENT WITH ANTEGRADE FLOW. ICA/CCA RATIO 1.61 ON RIGHT AND 0.75 ON LEFT   CATARACT EXTRACTION, BILATERAL     CHOLECYSTECTOMY  1984   COLONOSCOPY  04/29/2010   [facility]   CYSTOSCOPY  11/11/2011   Procedure: CYSTOSCOPY;  Surgeon: Oneil FORBES Piety, MD;  Location: [facility];  Service: Gynecology;  Laterality: N/A;   EXTUBATION (ENDOTRACHEAL) IN OR N/A 09/23/2017   Procedure: EXTUBATION (ENDOTRACHEAL) IN OR;  Surgeon: Terri Alan PARAS, MD;  Location: [facility];  Service: ENT;  Laterality: N/A;   FIBEROPTIC LARYNGOSCOPY AND TRACHEOSCOPY N/A 09/23/2017   Procedure: FLEXIBLE FIBEROPTIC LARYNGOSCOPY;  Surgeon: Terri Alan PARAS, MD;  Location: [facility];  Service: ENT;   Laterality: N/A;   FRACTURE SURGERY     foot   HALLUX FUSION Left 10/02/2018   Procedure: HALLUX ARTHRODESIS;  Surgeon: Janit Thresa HERO, DPM;  Location: [facility];  Service: Podiatry;  Laterality: Left;   HAMMER TOE SURGERY Left 10/02/2018   Procedure: HAMMER TOE CORRECTION 2ND 3RD 4RD FIFTH TOE;  Surgeon: Janit Thresa HERO, DPM;  Location: [facility];  Service: Podiatry;  Laterality: Left;   HAMMER TOE SURGERY Right 04/12/2019   Procedure: HAMMER TOE CORRECTION, 2ND, 3RD, 4TH AND 5TH TOES OF RIGHT FOOT;  Surgeon: Janit Thresa HERO, DPM;  Location: [facility];  Service: Podiatry;  Laterality: Right;   HAMMER TOE SURGERY Left 10/25/2019   Procedure: HAMMER TOE REPAIR SECOND, THIRD, FOUTH;  Surgeon: Janit Thresa HERO, DPM;  Location: [facility];  Service: Podiatry;  Laterality: Left;   HERNIA REPAIR     I & D EXTREMITY Left 06/27/2015   Procedure: Partial Excision Left Calcaneus, Place Antibiotic Beads, and Wound VAC;  Surgeon: Jerona Harden GAILS, MD;  Location: [facility];  Service: Orthopedics;  Laterality: Left;   JOINT REPLACEMENT     KNEE ARTHROSCOPY     right   LAPAROSCOPIC ASSISTED VENTRAL HERNIA REPAIR N/A 03/26/2024   Procedure: REPAIR, HERNIA, VENTRAL, LAPAROSCOPY-ASSISTED;  Surgeon: Rubin Calamity, MD;  Location: [facility];  Service: General;  Laterality: N/A;  LAP VENTRAL HERNIA REPAIR WITH MESH   LAPAROSCOPIC LYSIS OF ADHESIONS N/A 03/26/2024   Procedure: LYSIS, ADHESIONS, LAPAROSCOPIC;  Surgeon: Rubin Calamity, MD;  Location: [facility] OR;  Service: General;  Laterality: N/A;   LAPAROSCOPY N/A 03/26/2024   Procedure: LAPAROSCOPY, DIAGNOSTIC;  Surgeon: Rubin Calamity, MD;  Location: [facility] OR;  Service: General;  Laterality: N/A;   LEFT AND RIGHT HEART CATHETERIZATION WITH CORONARY ANGIOGRAM N/A 04/23/2013   Procedure: LEFT AND RIGHT HEART CATHETERIZATION WITH CORONARY ANGIOGRAM;  Surgeon: Alm LELON Clay, MD;  Location: [facility];  Service: Cardiovascular;  Laterality: N/A;   LEFT HEART CATH AND CORONARY ANGIOGRAPHY N/A  03/11/2017   Procedure: Left Heart Cath and  Coronary Angiography;  Surgeon: Wonda Sharper, MD;  Location: [facility] INVASIVE CV LAB; angiographically minimal CAD in the LAD otherwise normal.;  Normal LVEDP.  FALSE POSITIVE MYOVIEW    LEFT HEART CATH AND CORONARY ANGIOGRAPHY  07/20/2010   LVEF 50-55% WITH VERY MILD GLOBAL HYPOKINESIA; ESSENTIALLY NORMAL CORONARY ARTERIES; NORMAL LV FUNCTION   METATARSAL OSTEOTOMY WITH OPEN REDUCTION INTERNAL FIXATION (ORIF) METATARSAL WITH FUSION Left 04/09/2014   Procedure: LEFT FOOT FRACTURE OPEN TREATMENT METATARSAL INCLUDES INTERNAL FIXATION EACH;  Surgeon: Lamar DELENA Millman, MD;  Location: [facility];  Service: Orthopedics;  Laterality: Left;   NISSEN FUNDOPLICATION  2004   NM MYOVIEW  LTD --> FALSE POSITIVE  03/10/2017   : Moderate size stress-induced perfusion defect at the apex as well as ill-defined stress-induced perfusion defect in the lateral wall.  EF 55%.  INTERMEDIATE risk. -->  FALSE POSITIVE   ORIF DISTAL FEMUR FRACTURE  08/08/2020   [facility]: ORIF of left extra-articular periprosthetic distal femur fracture with synthesis of the femur plate with cortical nonlocking screws.  (Thought to be pathologic fracture due to osteoporosis) -> after a fall   ORIF FEMUR W/ PERI-IMPLANT  08/29/2020   [facility]) revision of left femur fracture ORIF plate fixation screws; culture positive for MSSA   Right and left CARDIAC CATHETERIZATION  04/23/2013   Angiographic normal coronaries; LVEDP 20 mmHg, PCWP 12-14 mmHg, RAP 12 mmHg.; Fick CO/CI 4.9/2.2   ROTATOR CUFF REPAIR Right 12/09/2021   SHOULDER ARTHROSCOPY Left 03/14/2019   Procedure: LEFT SHOULDER ARTHROSCOPY, DEBRIDEMENT, AND DECOMPRESSION;  Surgeon: Harden Jerona GAILS, MD;  Location: [facility];  Service: Orthopedics;  Laterality: Left;   TOTAL KNEE ARTHROPLASTY Right 06/29/2017   Procedure: RIGHT TOTAL KNEE ARTHROPLASTY;  Surgeon: Harden Jerona GAILS, MD;  Location: [facility] OR;  Service:  Orthopedics;  Laterality: Right;   TOTAL KNEE ARTHROPLASTY Left 12/07/2017   TOTAL KNEE ARTHROPLASTY Left 12/07/2017   Procedure: LEFT TOTAL KNEE ARTHROPLASTY;  Surgeon: Harden Jerona GAILS, MD;  Location: [facility] OR;  Service: Orthopedics;  Laterality: Left;   TRACHEOSTOMY TUBE PLACEMENT N/A 09/20/2017   Procedure: AWAKE INTUBATION WITH ANESTHESIA WITH VIDEO ASSISTANCE;  Surgeon: Terri Alan PARAS, MD;  Location: [facility];  Service: ENT;  Laterality: N/A;   TRANSTHORACIC ECHOCARDIOGRAM  08/2014   Normal LV size and function.  Mild LVH.  EF 55-60%.  Normal regional wall motion.  GR 1 DD.  Normal RV size and function .   TRANSTHORACIC ECHOCARDIOGRAM  08/12/2020    [facility]): Normal LV size and function.  EF 55 to 60%.  No RWM A.  Normal filling pattern.  Normal RV.  No significant valvular disease-stenosis or regurgitation.  No vegetation.   TUBAL LIGATION     with reversal in 1994   UPPER GASTROINTESTINAL ENDOSCOPY     VENTRAL HERNIA REPAIR     Allergies  Allergen Reactions   Firvanq  [Vancomycin ] Other (See Comments)    Dose related nephrotoxicity    Methotrexate Other (See Comments)    Peri-oral and buccal lesions   Lisinopril  Cough   Chlorhexidine  Itching   Cleocin  [Clindamycin ] Nausea And Vomiting and Rash   Lincocin [Lincomycin Hcl] Nausea And Vomiting and Rash   Teflaro [Ceftaroline] Rash and Other (See Comments)    Tolerates ceftriaxone        Objective:    Physical Exam Vitals and nursing note reviewed.  Constitutional:      Appearance: Normal appearance.  Cardiovascular:     Rate and Rhythm:  Normal rate and regular rhythm.  Pulmonary:     Effort: Pulmonary effort is normal.     Breath sounds: Normal breath sounds.  Musculoskeletal:        General: Normal range of motion.  Skin:    General: Skin is warm and dry.  Neurological:     Mental Status: She is alert.  Psychiatric:        Mood and Affect: Mood normal.        Behavior: Behavior normal.    BP 124/73  (BP Location: Left Arm, Patient Position: Sitting, Cuff Size: Normal)   Pulse 77   Temp 97.7 F (36.5 C) (Temporal)   Ht 5' 6 (1.676 m)   Wt 190 lb 12.8 oz (86.5 kg)   SpO2 100%   BMI 30.80 kg/m  Wt Readings from Last 3 Encounters:  06/01/24 190 lb 12.8 oz (86.5 kg)  05/21/24 196 lb (88.9 kg)  04/18/24 198 lb (89.8 kg)      Lucius Krabbe, NP

## 2024-06-02 LAB — URINE CULTURE
MICRO NUMBER:: 16870696
SPECIMEN QUALITY:: ADEQUATE

## 2024-06-03 ENCOUNTER — Ambulatory Visit: Payer: Self-pay | Admitting: Family

## 2024-06-12 ENCOUNTER — Encounter: Payer: Self-pay | Admitting: Sports Medicine

## 2024-06-14 ENCOUNTER — Other Ambulatory Visit: Payer: Self-pay | Admitting: Cardiology

## 2024-07-04 ENCOUNTER — Ambulatory Visit (INDEPENDENT_AMBULATORY_CARE_PROVIDER_SITE_OTHER): Admitting: Family

## 2024-07-04 ENCOUNTER — Encounter: Payer: Self-pay | Admitting: Family

## 2024-07-04 VITALS — BP 134/60 | HR 79 | Temp 98.4°F | Ht 66.0 in

## 2024-07-04 DIAGNOSIS — B349 Viral infection, unspecified: Secondary | ICD-10-CM | POA: Diagnosis not present

## 2024-07-04 DIAGNOSIS — R059 Cough, unspecified: Secondary | ICD-10-CM

## 2024-07-04 DIAGNOSIS — J4551 Severe persistent asthma with (acute) exacerbation: Secondary | ICD-10-CM | POA: Diagnosis not present

## 2024-07-04 LAB — POCT INFLUENZA A/B
Influenza A, POC: NEGATIVE
Influenza B, POC: NEGATIVE

## 2024-07-04 LAB — POC COVID19 BINAXNOW: SARS Coronavirus 2 Ag: NEGATIVE

## 2024-07-04 MED ORDER — ALBUTEROL SULFATE (2.5 MG/3ML) 0.083% IN NEBU: 2.5000 mg | INHALATION_SOLUTION | Freq: Four times a day (QID) | RESPIRATORY_TRACT | 2 refills | Status: AC | PRN

## 2024-07-04 NOTE — Progress Notes (Signed)
 Patient ID: [name], female    DOB: 03/07/1961, 63 y.o.   MRN: [id number]  Chief Complaint  Patient presents with   Cough    Pt c/o cough, nasal congestion, sore throat and chest congestion. Present for 2-3 days, Has tried tylenol .  Discussed the use of AI scribe software for clinical note transcription with the patient, who gave verbal consent to proceed.  History of Present Illness   [name] is a 63 year old female with asthma who presents with a sore throat, headache, and runny nose.  She experiences a sore throat, which she describes as mild and similar to previous strep infections. She has a history of strep throat with positive tests in the past two years, but a negative test earlier this year. She also has a headache and runny nose. She has not taken sinus medication and has no formal allergy diagnosis.  A dry cough has worsened since returning from the beach on Sunday. She was exposed to someone with COVID-19. She experiences chest pain, attributed to coughing. She uses an inhaler in the mornings and at night and has a nebulizer available but has not used it recently. She has asthma and Sarcoidosis and uses Breo once daily. She has frequent bronchitis but currently has no mucus production. She uses albuterol  in the mornings and at night.  Yesterday, she experienced chills and felt unwell, staying in her bedroom. She did not check her temperature but felt warm. She uses Tylenol  for relief and believes she can take ibuprofen  or Aleve . She denies shortness of breath but reports chest discomfort from coughing.     Assessment and Plan    Acute upper respiratory infection w/Asthma exacerbation Symptoms consistent with viral upper respiratory infection. COVID-19 and influenza tests negative. Mild sore throat not indicative of streptococcal pharyngitis. Likely viral etiology. Asthma exacerbated by upper respiratory infection. Increased chest discomfort and wheezing noted on  auscultation. - Advised to restart her Flonase  nasal spray, one squirt each nostril twice daily for 2 days, then reduce to once daily until symptoms improve. - Use nebulizer twice daily, potentially three times daily if tolerated, instead of inhaler when at home, sending refill of nebulizer albuterol . - Continue Breo once daily. - Increase fluid intake. - Contact via MyChart if symptoms do not improve by Monday.      Subjective:    Outpatient Medications Prior to Visit  Medication Sig Dispense Refill   albuterol  (PROVENTIL ) (2.5 MG/3ML) 0.083% nebulizer solution Take 3 mLs (2.5 mg total) by nebulization every 6 (six) hours as needed for wheezing or shortness of breath (Use 2-3 times per day when sick.). 75 mL 2   albuterol  (VENTOLIN  HFA) 108 (90 Base) MCG/ACT inhaler Inhale 1-2 puffs into the lungs every 4 (four) hours as needed for wheezing or shortness of breath. 8.5 each 1   aspirin  EC 81 MG tablet Take 1 tablet (81 mg total) by mouth daily. 30 tablet 11   atorvastatin  (LIPITOR) 40 MG tablet Take 1 tablet (40 mg total) by mouth daily. 90 tablet 3   benzonatate  (TESSALON  PERLES) 100 MG capsule Take 1-2 capsules (100-200 mg total) by mouth 3 (three) times daily as needed for cough. 30 capsule 3   BREO ELLIPTA  200-25 MCG/INH AEPB TAKE 1 PUFF BY MOUTH EVERY DAY 60 each 5   buPROPion  (WELLBUTRIN  XL) 300 MG 24 hr tablet TAKE 1 TABLET BY MOUTH EVERY DAY 90 tablet 3   Calcium  Carbonate (CALTRATE 600 PO) Orally Once a  day for 30 day(s)     chlorpheniramine -HYDROcodone  (TUSSIONEX) 10-8 MG/5ML Take 5 mLs by mouth at bedtime as needed for cough (do not drive for 8 hours after taking). 115 mL 0   Continuous Glucose Sensor (DEXCOM G7 SENSOR) MISC 1 DEVICE BY DOES NOT APPLY ROUTE AS DIRECTED. CHANGE SENSOR EVERY 10 DAYS 3 each 3   cyclobenzaprine  (FLEXERIL ) 5 MG tablet Take 1-2 tablets (5-10 mg total) by mouth 3 (three) times daily as needed for muscle spasms (May cause drowsiness). 30 tablet 0    diltiazem  (CARDIZEM  CD) 120 MG 24 hr capsule TAKE 1 CAPSULE BY MOUTH EVERY DAY 90 capsule 3   estradiol  (ESTRACE ) 0.1 MG/GM vaginal cream Place 0.5 Applicatorfuls vaginally daily as needed (irritation).     famotidine  (PEPCID ) 20 MG tablet TAKE 1 TABLET BY MOUTH TWICE A DAY 180 tablet 1   fenofibrate  160 MG tablet TAKE 1 TABLET BY MOUTH EVERY DAY 90 tablet 2   fluticasone  (FLONASE ) 50 MCG/ACT nasal spray Place 2 sprays into both nostrils daily as needed for allergies. 48 mL 3   furosemide  (LASIX ) 40 MG tablet Take 1 tablet (40 mg total) by mouth daily. 90 tablet 3   gabapentin  (NEURONTIN ) 100 MG capsule TAKE 1 CAPSULE BY MOUTH THREE TIMES A DAY 270 capsule 3   glucose blood (CONTOUR NEXT TEST) test strip 1 each by Other route 4 (four) times daily. And lancets 4/day 400 each 3   guaiFENesin -dextromethorphan  (ROBITUSSIN DM) 100-10 MG/5ML syrup Take 5 mLs by mouth every 4 (four) hours as needed for cough (chest congestion). 118 mL 0   Insulin  Pen Needle (PEN NEEDLES) 32G X 4 MM MISC 1 each by Does not apply route 4 (four) times daily. E11.9 400 each 0   ipratropium (ATROVENT ) 0.06 % nasal spray PLACE 2 SPRAYS INTO BOTH NOSTRILS 4 TIMES DAILY. 45 mL 3   KLOR-CON  M20 20 MEQ tablet TAKE 1 & 1/2 TABLETS BY MOUTH TWICE A DAY 270 tablet 2   Lancet Devices (EASY MINI EJECT LANCING DEVICE) MISC Use as directed 4 times daily to test blood sugar 1 each 3   LANTUS  SOLOSTAR 100 UNIT/ML Solostar Pen Inject 24 Units into the skin daily.     metFORMIN  (GLUCOPHAGE -XR) 500 MG 24 hr tablet Take 1 tablet (500 mg total) by mouth 2 (two) times daily with a meal. 180 tablet 0   Microlet Lancets MISC 1 each by Does not apply route 2 (two) times daily. E11.9 100 each 2   ondansetron  (ZOFRAN -ODT) 4 MG disintegrating tablet Take 1 tablet (4 mg total) by mouth every 8 (eight) hours as needed for nausea or vomiting. 20 tablet 0   pantoprazole  (PROTONIX ) 40 MG tablet TAKE 1 TABLET BY MOUTH TWICE A DAY 180 tablet 1    phenazopyridine  (PYRIDIUM ) 100 MG tablet Take 1 tablet (100 mg total) by mouth 3 (three) times daily as needed for pain. 10 tablet 0   telmisartan  (MICARDIS ) 40 MG tablet TAKE 1 TABLET BY MOUTH EVERY DAY 90 tablet 3   tirzepatide  (MOUNJARO ) 12.5 MG/0.5ML Pen Inject 12.5 mg into the skin once a week. 6 mL 0   traMADol  (ULTRAM ) 50 MG tablet Take 1 tablet (50 mg total) by mouth every 6 (six) hours as needed. 20 tablet 0   venlafaxine  XR (EFFEXOR -XR) 75 MG 24 hr capsule TAKE 1 CAPSULE BY MOUTH TWICE A DAY 180 capsule 3   No facility-administered medications prior to visit.   Past Medical History:  Diagnosis Date  Abnormal SPEP 04/17/2014   Acute left ankle pain 01/26/2017   Allergy    ANEMIA-UNSPECIFIED 09/18/2009   Anxiety    Arthritis    Blood transfusion without reported diagnosis    Cataract    CHF (congestive heart failure) (HCC)    Chronic diastolic heart failure, NYHA class 2 (HCC)    Normal LVEDP by May 2018   COPD (chronic obstructive pulmonary disease) (HCC)    Depression    DIABETES MELLITUS, TYPE II 08/21/2006   Diabetic osteomyelitis (HCC) 05/29/2015   Fatty liver    Fracture of 5th metatarsal    non union   GERD 08/21/2006   Had fundoplication   GOUT 08/20/2010   Hammer toe of right foot    2-5th toes   Hx of umbilical hernia repair    HYPERLIPIDEMIA 08/21/2006   HYPERTENSION 08/21/2006   Infection of prosthetic left knee joint 08/29/2020   Infection of wound due to methicillin resistant Staphylococcus aureus (MRSA)    Internal hemorrhoids    Kidney problem    Multiple allergies 10/14/2016   OBESITY 06/04/2009   Onychomycosis 10/27/2015   Osteomyelitis of left foot (HCC) 05/29/2015   Osteoporosis    Pulmonary sarcoidosis    Followed locally by pulmonology, but also by Dr. Lavella at [facility]   Right knee pain 01/26/2017   Vocal cord dysfunction    Wears partial dentures    Past Surgical History:  Procedure Laterality Date   ABDOMINAL  HYSTERECTOMY     APPENDECTOMY     BLADDER SUSPENSION  11/11/2011   Procedure: TRANSVAGINAL TAPE (TVT) PROCEDURE;  Surgeon: Oneil FORBES Piety, MD;  Location: [facility];  Service: Gynecology;  Laterality: N/A;   CAROTIDS  02/18/2011   CAROTID DUPLEX; VERTEBRALS ARE PATENT WITH ANTEGRADE FLOW. ICA/CCA RATIO 1.61 ON RIGHT AND 0.75 ON LEFT   CATARACT EXTRACTION, BILATERAL     CHOLECYSTECTOMY  1984   COLONOSCOPY  04/29/2010   [facility]   CYSTOSCOPY  11/11/2011   Procedure: CYSTOSCOPY;  Surgeon: Oneil FORBES Piety, MD;  Location: [facility];  Service: Gynecology;  Laterality: N/A;   EXTUBATION (ENDOTRACHEAL) IN OR N/A 09/23/2017   Procedure: EXTUBATION (ENDOTRACHEAL) IN OR;  Surgeon: Terri Alan PARAS, MD;  Location: [facility];  Service: ENT;  Laterality: N/A;   FIBEROPTIC LARYNGOSCOPY AND TRACHEOSCOPY N/A 09/23/2017   Procedure: FLEXIBLE FIBEROPTIC LARYNGOSCOPY;  Surgeon: Terri Alan PARAS, MD;  Location: [facility];  Service: ENT;  Laterality: N/A;   FRACTURE SURGERY     foot   HALLUX FUSION Left 10/02/2018   Procedure: HALLUX ARTHRODESIS;  Surgeon: Janit Thresa HERO, DPM;  Location: [facility];  Service: Podiatry;  Laterality: Left;   HAMMER TOE SURGERY Left 10/02/2018   Procedure: HAMMER TOE CORRECTION 2ND 3RD 4RD FIFTH TOE;  Surgeon: Janit Thresa HERO, DPM;  Location: [facility];  Service: Podiatry;  Laterality: Left;   HAMMER TOE SURGERY Right 04/12/2019   Procedure: HAMMER TOE CORRECTION, 2ND, 3RD, 4TH AND 5TH TOES OF RIGHT FOOT;  Surgeon: Janit Thresa HERO, DPM;  Location: [facility];  Service: Podiatry;  Laterality: Right;   HAMMER TOE SURGERY Left 10/25/2019   Procedure: HAMMER TOE REPAIR SECOND, THIRD, FOUTH;  Surgeon: Janit Thresa HERO, DPM;  Location: [facility];  Service: Podiatry;  Laterality: Left;   HERNIA REPAIR     I & D EXTREMITY Left 06/27/2015   Procedure: Partial Excision Left Calcaneus, Place Antibiotic Beads, and Wound VAC;  Surgeon: Jerona Harden GAILS, MD;  Location: [facility];  Service:  Orthopedics;  Laterality: Left;    JOINT REPLACEMENT     KNEE ARTHROSCOPY     right   LAPAROSCOPIC ASSISTED VENTRAL HERNIA REPAIR N/A 03/26/2024   Procedure: REPAIR, HERNIA, VENTRAL, LAPAROSCOPY-ASSISTED;  Surgeon: Rubin Calamity, MD;  Location: [facility];  Service: General;  Laterality: N/A;  LAP VENTRAL HERNIA REPAIR WITH MESH   LAPAROSCOPIC LYSIS OF ADHESIONS N/A 03/26/2024   Procedure: LYSIS, ADHESIONS, LAPAROSCOPIC;  Surgeon: Rubin Calamity, MD;  Location[facility] OR;  Service: General;  Laterality: N/A;   LAPAROSCOPY N/A 03/26/2024   Procedure: LAPAROSCOPY, DIAGNOSTIC;  Surgeon: Rubin Calamity, MD;  Location: [facility] OR;  Service: General;  Laterality: N/A;   LEFT AND RIGHT HEART CATHETERIZATION WITH CORONARY ANGIOGRAM N/A 04/23/2013   Procedure: LEFT AND RIGHT HEART CATHETERIZATION WITH CORONARY ANGIOGRAM;  Surgeon: Alm LELON Clay, MD;  Location: [facility] CATH LAB;  Service: Cardiovascular;  Laterality: N/A;   LEFT HEART CATH AND CORONARY ANGIOGRAPHY N/A 03/11/2017   Procedure: Left Heart Cath and Coronary Angiography;  Surgeon: Wonda Sharper, MD;  Location: [facility] INVASIVE CV LAB; angiographically minimal CAD in the LAD otherwise normal.;  Normal LVEDP.  FALSE POSITIVE MYOVIEW    LEFT HEART CATH AND CORONARY ANGIOGRAPHY  07/20/2010   LVEF 50-55% WITH VERY MILD GLOBAL HYPOKINESIA; ESSENTIALLY NORMAL CORONARY ARTERIES; NORMAL LV FUNCTION   METATARSAL OSTEOTOMY WITH OPEN REDUCTION INTERNAL FIXATION (ORIF) METATARSAL WITH FUSION Left 04/09/2014   Procedure: LEFT FOOT FRACTURE OPEN TREATMENT METATARSAL INCLUDES INTERNAL FIXATION EACH;  Surgeon: Lamar DELENA Millman, MD;  Location: [facility];  Service: Orthopedics;  Laterality: Left;   NISSEN FUNDOPLICATION  2004   NM MYOVIEW  LTD --> FALSE POSITIVE  03/10/2017   : Moderate size stress-induced perfusion defect at the apex as well as ill-defined stress-induced perfusion defect in the lateral wall.  EF 55%.  INTERMEDIATE risk. -->  FALSE POSITIVE   ORIF DISTAL FEMUR FRACTURE  08/08/2020    [facility] [place]: ORIF of left extra-articular periprosthetic distal femur fracture with synthesis of the femur plate with cortical nonlocking screws.  (Thought to be pathologic fracture due to osteoporosis) -> after a fall   ORIF FEMUR W/ PERI-IMPLANT  08/29/2020   [facility]) revision of left femur fracture ORIF plate fixation screws; culture positive for MSSA   Right and left CARDIAC CATHETERIZATION  04/23/2013   Angiographic normal coronaries; LVEDP 20 mmHg, PCWP 12-14 mmHg, RAP 12 mmHg.; Fick CO/CI 4.9/2.2   ROTATOR CUFF REPAIR Right 12/09/2021   SHOULDER ARTHROSCOPY Left 03/14/2019   Procedure: LEFT SHOULDER ARTHROSCOPY, DEBRIDEMENT, AND DECOMPRESSION;  Surgeon: Harden Jerona GAILS, MD;  Location: [facility];  Service: Orthopedics;  Laterality: Left;   TOTAL KNEE ARTHROPLASTY Right 06/29/2017   Procedure: RIGHT TOTAL KNEE ARTHROPLASTY;  Surgeon: Harden Jerona GAILS, MD;  Location: [facility] OR;  Service: Orthopedics;  Laterality: Right;   TOTAL KNEE ARTHROPLASTY Left 12/07/2017   TOTAL KNEE ARTHROPLASTY Left 12/07/2017   Procedure: LEFT TOTAL KNEE ARTHROPLASTY;  Surgeon: Harden Jerona GAILS, MD;  Location: [facility] OR;  Service: Orthopedics;  Laterality: Left;   TRACHEOSTOMY TUBE PLACEMENT N/A 09/20/2017   Procedure: AWAKE INTUBATION WITH ANESTHESIA WITH VIDEO ASSISTANCE;  Surgeon: Terri Alan PARAS, MD;  Location: [facility];  Service: ENT;  Laterality: N/A;   TRANSTHORACIC ECHOCARDIOGRAM  08/2014   Normal LV size and function.  Mild LVH.  EF 55-60%.  Normal regional wall motion.  GR 1 DD.  Normal RV size and function .   TRANSTHORACIC ECHOCARDIOGRAM  08/12/2020    [facility]  [place]): Normal LV size and function.  EF 55 to 60%.  No RWM A.  Normal filling pattern.  Normal RV.  No significant valvular disease-stenosis or regurgitation.  No vegetation.   TUBAL LIGATION     with reversal in 1994   UPPER GASTROINTESTINAL ENDOSCOPY     VENTRAL HERNIA REPAIR     Allergies  Allergen Reactions    Firvanq  [Vancomycin ] Other (See Comments)    Dose related nephrotoxicity    Methotrexate Other (See Comments)    Peri-oral and buccal lesions   Lisinopril  Cough   Chlorhexidine  Itching   Cleocin  [Clindamycin ] Nausea And Vomiting and Rash   Lincocin [Lincomycin Hcl] Nausea And Vomiting and Rash   Teflaro [Ceftaroline] Rash and Other (See Comments)    Tolerates ceftriaxone        Objective:    Physical Exam Vitals and nursing note reviewed.  Constitutional:      Appearance: Normal appearance. She is ill-appearing.     Interventions: Face mask in place.  HENT:     Right Ear: Tympanic membrane and ear canal normal.     Left Ear: Tympanic membrane and ear canal normal.     Nose:     Right Sinus: No frontal sinus tenderness (pressure).     Left Sinus: No frontal sinus tenderness (pressure).     Mouth/Throat:     Mouth: Mucous membranes are moist.     Pharynx: Posterior oropharyngeal erythema (mild) present. No pharyngeal swelling, oropharyngeal exudate or uvula swelling.     Tonsils: No tonsillar exudate or tonsillar abscesses.  Cardiovascular:     Rate and Rhythm: Normal rate and regular rhythm.  Pulmonary:     Effort: Pulmonary effort is normal.     Breath sounds: Examination of the right-upper field reveals wheezing. Examination of the left-upper field reveals wheezing and rhonchi. Wheezing and rhonchi present.  Musculoskeletal:        General: Normal range of motion.  Lymphadenopathy:     Head:     Right side of head: No preauricular or posterior auricular adenopathy.     Left side of head: No preauricular or posterior auricular adenopathy.     Cervical: No cervical adenopathy.  Skin:    General: Skin is warm and dry.  Neurological:     Mental Status: She is alert.  Psychiatric:        Mood and Affect: Mood normal.        Behavior: Behavior normal.    BP 134/60 (BP Location: Left Arm, Patient Position: Sitting, Cuff Size: Large)   Pulse 79   Temp 98.4 F (36.9 C)  (Temporal)   Ht 5' 6 (1.676 m)   SpO2 97%   BMI 30.80 kg/m  Wt Readings from Last 3 Encounters:  06/01/24 190 lb 12.8 oz (86.5 kg)  05/21/24 196 lb (88.9 kg)  04/18/24 198 lb (89.8 kg)      Lucius Krabbe, NP

## 2024-07-09 ENCOUNTER — Encounter: Payer: Self-pay | Admitting: Family Medicine

## 2024-07-09 ENCOUNTER — Ambulatory Visit: Admitting: Family Medicine

## 2024-07-09 VITALS — BP 110/62 | HR 81 | Temp 97.6°F | Ht 66.0 in | Wt 185.6 lb

## 2024-07-09 DIAGNOSIS — I10 Essential (primary) hypertension: Secondary | ICD-10-CM | POA: Diagnosis not present

## 2024-07-09 DIAGNOSIS — J4551 Severe persistent asthma with (acute) exacerbation: Secondary | ICD-10-CM | POA: Diagnosis not present

## 2024-07-09 DIAGNOSIS — Z794 Long term (current) use of insulin: Secondary | ICD-10-CM

## 2024-07-09 DIAGNOSIS — J329 Chronic sinusitis, unspecified: Secondary | ICD-10-CM | POA: Diagnosis not present

## 2024-07-09 DIAGNOSIS — B9689 Other specified bacterial agents as the cause of diseases classified elsewhere: Secondary | ICD-10-CM

## 2024-07-09 DIAGNOSIS — N1832 Chronic kidney disease, stage 3b: Secondary | ICD-10-CM | POA: Diagnosis not present

## 2024-07-09 DIAGNOSIS — E1122 Type 2 diabetes mellitus with diabetic chronic kidney disease: Secondary | ICD-10-CM

## 2024-07-09 MED ORDER — PREDNISONE 20 MG PO TABS: ORAL_TABLET | ORAL | 0 refills | Status: DC

## 2024-07-09 MED ORDER — BENZONATATE 100 MG PO CAPS
100.0000 mg | ORAL_CAPSULE | Freq: Three times a day (TID) | ORAL | 3 refills | Status: AC | PRN
Start: 2024-07-09 — End: ?

## 2024-07-09 MED ORDER — FLUTICASONE FUROATE-VILANTEROL 200-25 MCG/ACT IN AEPB: 1.0000 | INHALATION_SPRAY | Freq: Every day | RESPIRATORY_TRACT | 3 refills | Status: DC

## 2024-07-09 MED ORDER — AMOXICILLIN-POT CLAVULANATE 875-125 MG PO TABS: 1.0000 | ORAL_TABLET | Freq: Two times a day (BID) | ORAL | 0 refills | Status: AC

## 2024-07-09 MED ORDER — HYDROCOD POLI-CHLORPHE POLI ER 10-8 MG/5ML PO SUER: 5.0000 mL | Freq: Every evening | ORAL | 0 refills | Status: AC | PRN

## 2024-07-09 NOTE — Progress Notes (Signed)
 Phone [phone] In person visit   Subjective:   [name] is a 63 y.o. year old very pleasant female patient who presents for/with See problem oriented charting Chief Complaint  Patient presents with   Nasal Congestion    Negative flu and covid test 9/24; cough; chest pain on inspiration; nasal congestion; headache;    Discussed the use of AI scribe software for clinical note transcription with the patient, who gave verbal consent to proceed.  History of Present Illness   [name] is a 63 year old female with chronic kidney disease stage 3B, sarcoidosis, obstructive chronic bronchitis, and severe persistent asthma who presents with persistent coughing and congestion.  She was initially seen on July 04, 2024, with negative flu and COVID testing. Her symptoms have persisted for approximately 10 days, with no improvement and possibly worsening. She experiences nasal and chest congestion, headaches, and reports discomfort when taking deep breaths. Wheezing and chest tightness are present; the patient thinks her sarcoidosis might be acting up.  She has a history of chronic kidney disease stage 3B, with her most recent GFR at 43 two months ago and 32 three months ago. Her medical history also includes sarcoidosis of the lung, obstructive chronic bronchitis, and severe persistent asthma. She is currently on Breo 200/25 mcg, one puff daily, and uses her nebulizer twice a day. She was previously on prednisone  but has been off it for some time.  She reports a sore throat that has improved, but significant nasal congestion and chest discomfort persist. She also experiences ear pain and fullness.  Her medication regimen includes Lantus  20 units daily, Mounjaro  12.5 mg weekly, metformin  extended release twice daily, furosemide  daily, diltiazem  extended release 120 mg, and telmisartan  40 mg amongst other medications. She has a history of a rash with cephalosporins but tolerates  Augmentin  well. She manages her diabetes with insulin  and has experienced weight loss, which has improved her blood sugar control.  Her A1c has been 6.4 most recently    Past Medical History-  Patient Active Problem List   Diagnosis Date Noted   Severe persistent asthma 01/04/2019    Priority: High   Subcortical microvascular ischemic occlusive disease 07/12/2018    Priority: High   Chronic diastolic CHF (congestive heart failure) (HCC) 08/13/2017    Priority: High   Pulmonary sarcoidosis     Priority: High   Chronic bronchitis (HCC) in never smoker 09/18/2013    Priority: High   Chest pain 04/11/2013    Priority: High   Hypertensive heart disease with chronic diastolic congestive heart failure (HCC)     Priority: High   Sarcoidosis of lung 04/10/2007    Priority: High   Type II diabetes mellitus with renal manifestations (HCC) 08/21/2006    Priority: High   Iron  deficiency anemia 10/01/2020    Priority: Medium    Hypertriglyceridemia 04/02/2020    Priority: Medium    Aortic atherosclerosis 02/14/2020    Priority: Medium    Constipation 04/21/2018    Priority: Medium    Epiglottitis     Priority: Medium    Osteoarthritis of right knee 01/26/2017    Priority: Medium    Chronic kidney disease, stage 3b (HCC) 04/27/2015    Priority: Medium    History of osteomyelitis 03/27/2015    Priority: Medium    Fatty liver 09/30/2014    Priority: Medium    Major depression in full remission     Priority: Medium    Gout 08/20/2010  Priority: Medium    Deficiency anemia 09/18/2009    Priority: Medium    Sleep apnea 04/21/2009    Priority: Medium    Hyperlipidemia associated with type 2 diabetes mellitus (HCC) 08/21/2006    Priority: Medium    Hypertension 08/21/2006    Priority: Medium    GERD 08/21/2006    Priority: Medium    Nontraumatic complete tear of left rotator cuff     Priority: Low   Diabetic neuropathy (HCC) 02/20/2019    Priority: Low   Rapid palpitations  07/12/2018    Priority: Low   Impingement of left ankle joint 06/26/2018    Priority: Low   Total knee replacement status, left 12/07/2017    Priority: Low   Oropharyngeal dysphagia 09/20/2017    Priority: Low   Plantar fasciitis, right 07/13/2017    Priority: Low   S/P total knee arthroplasty, right 06/29/2017    Priority: Low   Osteoarthrosis, localized, primary, knee, right     Priority: Low   Sprain of calcaneofibular ligament of right ankle 06/06/2017    Priority: Low   Onychomycosis 10/27/2015    Priority: Low   MRSA (methicillin resistant staph aureus) culture positive 03/27/2015    Priority: Low   Hot flashes 07/15/2014    Priority: Low   Abnormal SPEP 04/17/2014    Priority: Low   Fracture of left leg 04/17/2014    Priority: Low   Internal hemorrhoids     Priority: Low   Preoperative clearance 03/25/2014    Priority: Low   Solitary pulmonary nodule, on CT 02/2013 - stable over 2 years in 2015 02/20/2013    Priority: Low   Pain of left hand 04/19/2022    Priority: 1.   Facet arthropathy, cervical 06/29/2021    Priority: 1.   Foraminal stenosis of cervical region 06/29/2021    Priority: 1.   Impaired gait 04/05/2024   Decreased activities of daily living (ADL) 04/05/2024   Impaired functional mobility, balance, gait, and endurance 04/05/2024   Left carpal tunnel syndrome 03/24/2023   Arthritis of carpometacarpal [facility]) joint of left thumb 03/10/2023   Radial styloid tenosynovitis (de quervain) 03/10/2023   Trigger finger of left thumb 03/10/2023   Preop cardiovascular exam 12/07/2021   Subacromial bursitis of right shoulder joint 08/03/2021   Neck pain, musculoskeletal 05/15/2021   Postural dizziness with presyncope 09/30/2020   Benign essential hypertension 08/07/2020   Obstructive chronic bronchitis with exacerbation (HCC) 09/18/2013    Medications- reviewed and updated Current Outpatient Medications  Medication Sig Dispense Refill   albuterol   (PROVENTIL ) (2.5 MG/3ML) 0.083% nebulizer solution Take 3 mLs (2.5 mg total) by nebulization every 6 (six) hours as needed for wheezing or shortness of breath (Use 2-3 times per day when sick.). 75 mL 2   albuterol  (VENTOLIN  HFA) 108 (90 Base) MCG/ACT inhaler Inhale 1-2 puffs into the lungs every 4 (four) hours as needed for wheezing or shortness of breath. 8.5 each 1   amoxicillin -clavulanate (AUGMENTIN ) 875-125 MG tablet Take 1 tablet by mouth 2 (two) times daily for 7 days. 14 tablet 0   aspirin  EC 81 MG tablet Take 1 tablet (81 mg total) by mouth daily. 30 tablet 11   atorvastatin  (LIPITOR) 40 MG tablet Take 1 tablet (40 mg total) by mouth daily. 90 tablet 3   buPROPion  (WELLBUTRIN  XL) 300 MG 24 hr tablet TAKE 1 TABLET BY MOUTH EVERY DAY 90 tablet 3   Calcium  Carbonate (CALTRATE 600 PO) Orally Once a day for 30  day(s)     Continuous Glucose Sensor (DEXCOM G7 SENSOR) MISC 1 DEVICE BY DOES NOT APPLY ROUTE AS DIRECTED. CHANGE SENSOR EVERY 10 DAYS 3 each 3   cyclobenzaprine  (FLEXERIL ) 5 MG tablet Take 1-2 tablets (5-10 mg total) by mouth 3 (three) times daily as needed for muscle spasms (May cause drowsiness). 30 tablet 0   diltiazem  (CARDIZEM  CD) 120 MG 24 hr capsule TAKE 1 CAPSULE BY MOUTH EVERY DAY 90 capsule 3   estradiol  (ESTRACE ) 0.1 MG/GM vaginal cream Place 0.5 Applicatorfuls vaginally daily as needed (irritation).     famotidine  (PEPCID ) 20 MG tablet TAKE 1 TABLET BY MOUTH TWICE A DAY 180 tablet 1   fenofibrate  160 MG tablet TAKE 1 TABLET BY MOUTH EVERY DAY 90 tablet 2   fluticasone  (FLONASE ) 50 MCG/ACT nasal spray Place 2 sprays into both nostrils daily as needed for allergies. 48 mL 3   fluticasone  furoate-vilanterol (BREO ELLIPTA ) 200-25 MCG/ACT AEPB Inhale 1 puff into the lungs daily. 3 each 3   furosemide  (LASIX ) 40 MG tablet Take 1 tablet (40 mg total) by mouth daily. 90 tablet 3   gabapentin  (NEURONTIN ) 100 MG capsule TAKE 1 CAPSULE BY MOUTH THREE TIMES A DAY 270 capsule 3   glucose  blood (CONTOUR NEXT TEST) test strip 1 each by Other route 4 (four) times daily. And lancets 4/day 400 each 3   guaiFENesin -dextromethorphan  (ROBITUSSIN DM) 100-10 MG/5ML syrup Take 5 mLs by mouth every 4 (four) hours as needed for cough (chest congestion). 118 mL 0   Insulin  Pen Needle (PEN NEEDLES) 32G X 4 MM MISC 1 each by Does not apply route 4 (four) times daily. E11.9 400 each 0   ipratropium (ATROVENT ) 0.06 % nasal spray PLACE 2 SPRAYS INTO BOTH NOSTRILS 4 TIMES DAILY. 45 mL 3   KLOR-CON  M20 20 MEQ tablet TAKE 1 & 1/2 TABLETS BY MOUTH TWICE A DAY 270 tablet 2   Lancet Devices (EASY MINI EJECT LANCING DEVICE) MISC Use as directed 4 times daily to test blood sugar 1 each 3   LANTUS  SOLOSTAR 100 UNIT/ML Solostar Pen Inject 24 Units into the skin daily.     metFORMIN  (GLUCOPHAGE -XR) 500 MG 24 hr tablet Take 1 tablet (500 mg total) by mouth 2 (two) times daily with a meal. 180 tablet 0   Microlet Lancets MISC 1 each by Does not apply route 2 (two) times daily. E11.9 100 each 2   ondansetron  (ZOFRAN -ODT) 4 MG disintegrating tablet Take 1 tablet (4 mg total) by mouth every 8 (eight) hours as needed for nausea or vomiting. 20 tablet 0   pantoprazole  (PROTONIX ) 40 MG tablet TAKE 1 TABLET BY MOUTH TWICE A DAY 180 tablet 1   phenazopyridine  (PYRIDIUM ) 100 MG tablet Take 1 tablet (100 mg total) by mouth 3 (three) times daily as needed for pain. 10 tablet 0   predniSONE  (DELTASONE ) 20 MG tablet Take 2 pills for 3 days, 1 pill for 4 days 10 tablet 0   telmisartan  (MICARDIS ) 40 MG tablet TAKE 1 TABLET BY MOUTH EVERY DAY 90 tablet 3   tirzepatide  (MOUNJARO ) 12.5 MG/0.5ML Pen Inject 12.5 mg into the skin once a week. 6 mL 0   traMADol  (ULTRAM ) 50 MG tablet Take 1 tablet (50 mg total) by mouth every 6 (six) hours as needed. 20 tablet 0   venlafaxine  XR (EFFEXOR -XR) 75 MG 24 hr capsule TAKE 1 CAPSULE BY MOUTH TWICE A DAY 180 capsule 3   benzonatate  (TESSALON  PERLES) 100 MG capsule Take 1-2  capsules (100-200 mg  total) by mouth 3 (three) times daily as needed for cough. 30 capsule 3   chlorpheniramine -HYDROcodone  (TUSSIONEX) 10-8 MG/5ML Take 5 mLs by mouth at bedtime as needed for cough (do not drive for 8 hours after taking). 115 mL 0   No current facility-administered medications for this visit.     Objective:  BP 110/62 (BP Location: Left Arm, Patient Position: Sitting, Cuff Size: Normal)   Pulse 81   Temp 97.6 F (36.4 C) (Temporal)   Ht 5' 6 (1.676 m)   Wt 185 lb 9.6 oz (84.2 kg)   SpO2 98%   BMI 29.96 kg/m  Gen: NAD, resting comfortably CV: RRR no murmurs rubs or gallops HEENT: No ear infection obviously with normal tympanic membranes bilaterally. Nasal mucosa somewhat dry. Sinus tenderness bilaterally and frontal and maxillary sinus. Throat appears normal. CHEST: Coarse breath sounds with wheeze diffusely.  Does not appear in respiratory distress Abdomen: soft/nontender/nondistended/normal bowel sounds. No rebound or guarding.  Ext: no significant edema Skin: warm, dry  LABS GFR: 32 (03/2024) GFR: 39 (04/2024)    Assessment and Plan      # Asthma exacerbation in patient with pulmonary sarcoidosis  -Worsening symptoms over the past 10 days, including wheezing, chest discomfort, and difficulty taking deep breaths. Coarse breath sounds and wheezing on examination indicate a possible exacerbation of asthma and bronchitis. Current symptoms suggest an acute lower respiratory infection exacerbating her chronic conditions. - Prescribe prednisone  40 mg for 3 days, then 20 mg for 4 days, total of 7 days. - Continue Breo 200/25 mcg, one puff daily. - Use nebulizer twice a day as needed. -Treating underlying sinusitis may help as well -She would like to have Tessalon  and  tussionex on hand for her to have her coughing spells  # Acute bacterial sinusitis Reports 10 days of sinus pressure, congestion, and tenderness, with no improvement. Examination reveals sinus tenderness and nasal  congestion, suggesting acute sinusitis. Symptoms may be due to a secondary bacterial infection following an upper respiratory infection. - Prescribe Augmentin  at full dose, considering kidney function is adequate.  # Sarcoidosis of lung Sarcoidosis of the lung may be contributing to her respiratory symptoms. Current treatment plan is focused on addressing the acute exacerbation of asthma   # Chronic kidney disease stage 3B Most recent GFR was 39 two months ago and 32 three months ago. Kidney function is adequate to allow full dosing of Augmentin  without adjustment. - Monitor kidney function regularly.  # Type 2 diabetes mellitus, well controlled-working with endocrinology Diabetes is well controlled with an A1c under 7. She is on Lantus  20 units daily, Mounjaro  12.5 mg weekly, and metformin  extended release twice daily. Prednisone  may affect blood sugar levels, but her diabetes is well managed, allowing for short-term use. - Continue current diabetes medications.  # Hypertension Blood pressure is well controlled, likely aided by recent weight loss. She is on furosemide , diltiazem  extended release, and telmisartan . Prednisone  may raise blood pressure, but current control is good. - Continue current antihypertensive regimen.     Recommended follow up: Return in about 6 months (around 01/06/2025) for physical or sooner if needed.Schedule b4 you leave. Future Appointments  Date Time Provider Department Center  07/13/2024 10:30 AM Parrett, Madelin RAMAN, NP [facility] [id number]  08/16/2024 10:15 AM [facility] None  08/21/2024  9:20 AM Dartha Ernst, MD [facility] None  01/08/2025  1:00 PM Katrinka Garnette KIDD, MD [facility]  04/09/2025  1:00 PM  [facility] ANNUAL WELLNESS VISIT 1 [facility] [place]    Lab/Order associations:   ICD-10-CM   1. Bacterial sinusitis  J32.9    B96.89     2. Severe persistent asthma with exacerbation  J45.51 benzonatate  (TESSALON  PERLES)  100 MG capsule    chlorpheniramine -HYDROcodone  (TUSSIONEX) 10-8 MG/5ML    3. Chronic kidney disease, stage 3b (HCC)  N18.32     4. Primary hypertension  I10     5. Type 2 diabetes mellitus with stage 3b chronic kidney disease, with long-term current use of insulin  (HCC)  E11.22    N18.32    Z79.4       Meds ordered this encounter  Medications   predniSONE  (DELTASONE ) 20 MG tablet    Sig: Take 2 pills for 3 days, 1 pill for 4 days    Dispense:  10 tablet    Refill:  0   amoxicillin -clavulanate (AUGMENTIN ) 875-125 MG tablet    Sig: Take 1 tablet by mouth 2 (two) times daily for 7 days.    Dispense:  14 tablet    Refill:  0   fluticasone  furoate-vilanterol (BREO ELLIPTA ) 200-25 MCG/ACT AEPB    Sig: Inhale 1 puff into the lungs daily.    Dispense:  3 each    Refill:  3   benzonatate  (TESSALON  PERLES) 100 MG capsule    Sig: Take 1-2 capsules (100-200 mg total) by mouth 3 (three) times daily as needed for cough.    Dispense:  30 capsule    Refill:  3   chlorpheniramine -HYDROcodone  (TUSSIONEX) 10-8 MG/5ML    Sig: Take 5 mLs by mouth at bedtime as needed for cough (do not drive for 8 hours after taking).    Dispense:  115 mL    Refill:  0    Return precautions advised.  Garnette Lukes, MD

## 2024-07-09 NOTE — Patient Instructions (Addendum)
 Try prednisone  for asthma exacerbation. Also Augmentin  for sinus infection with over 10 days of symptoms and worsening- follow up if fail to improve and otherwise...  Recommended follow up: Return in about 6 months (around 01/06/2025) for physical or sooner if needed.Schedule b4 you leave.  Congrats on weight loss and less insulin !

## 2024-07-10 ENCOUNTER — Encounter: Payer: Self-pay | Admitting: Family Medicine

## 2024-07-11 ENCOUNTER — Other Ambulatory Visit: Payer: Self-pay | Admitting: Family Medicine

## 2024-07-11 ENCOUNTER — Telehealth: Payer: Self-pay

## 2024-07-11 NOTE — Telephone Encounter (Signed)
 Pt called to report elevated sugar due to take prednisone , Pt started taking medication last night she reports sugar level is currently 264. Please advise.

## 2024-07-13 ENCOUNTER — Ambulatory Visit: Admitting: Adult Health

## 2024-07-21 ENCOUNTER — Other Ambulatory Visit: Payer: Self-pay

## 2024-07-21 ENCOUNTER — Emergency Department (HOSPITAL_COMMUNITY)

## 2024-07-21 ENCOUNTER — Emergency Department (HOSPITAL_COMMUNITY)
Admission: EM | Admit: 2024-07-21 | Discharge: 2024-07-21 | Disposition: A | Discharge status: Home / Self Care | Attending: Emergency Medicine | Admitting: Emergency Medicine

## 2024-07-21 ENCOUNTER — Encounter (HOSPITAL_COMMUNITY): Payer: Self-pay | Admitting: Emergency Medicine

## 2024-07-21 DIAGNOSIS — Z7982 Long term (current) use of aspirin: Secondary | ICD-10-CM | POA: Diagnosis not present

## 2024-07-21 DIAGNOSIS — D72829 Elevated white blood cell count, unspecified: Secondary | ICD-10-CM | POA: Diagnosis not present

## 2024-07-21 DIAGNOSIS — Z794 Long term (current) use of insulin: Secondary | ICD-10-CM | POA: Diagnosis not present

## 2024-07-21 DIAGNOSIS — R110 Nausea: Secondary | ICD-10-CM | POA: Insufficient documentation

## 2024-07-21 DIAGNOSIS — Z79899 Other long term (current) drug therapy: Secondary | ICD-10-CM | POA: Insufficient documentation

## 2024-07-21 DIAGNOSIS — I10 Essential (primary) hypertension: Secondary | ICD-10-CM | POA: Insufficient documentation

## 2024-07-21 DIAGNOSIS — Z7984 Long term (current) use of oral hypoglycemic drugs: Secondary | ICD-10-CM | POA: Diagnosis not present

## 2024-07-21 DIAGNOSIS — R1084 Generalized abdominal pain: Secondary | ICD-10-CM | POA: Diagnosis present

## 2024-07-21 DIAGNOSIS — D72819 Decreased white blood cell count, unspecified: Secondary | ICD-10-CM | POA: Diagnosis not present

## 2024-07-21 DIAGNOSIS — E119 Type 2 diabetes mellitus without complications: Secondary | ICD-10-CM | POA: Insufficient documentation

## 2024-07-21 DIAGNOSIS — K5900 Constipation, unspecified: Secondary | ICD-10-CM | POA: Insufficient documentation

## 2024-07-21 LAB — COMPREHENSIVE METABOLIC PANEL WITH GFR
ALT: 14 U/L (ref 0–44)
AST: 19 U/L (ref 15–41)
Albumin: 4 g/dL (ref 3.5–5.0)
Alkaline Phosphatase: 48 U/L (ref 38–126)
Anion gap: 10 (ref 5–15)
BUN: 28 mg/dL — ABNORMAL HIGH (ref 8–23)
CO2: 27 mmol/L (ref 22–32)
Calcium: 9.4 mg/dL (ref 8.9–10.3)
Chloride: 101 mmol/L (ref 98–111)
Creatinine, Ser: 1.61 mg/dL — ABNORMAL HIGH (ref 0.44–1.00)
GFR, Estimated: 35 mL/min — ABNORMAL LOW (ref >60)
Glucose, Bld: 178 mg/dL — ABNORMAL HIGH (ref 70–99)
Potassium: 4.2 mmol/L (ref 3.5–5.1)
Sodium: 138 mmol/L (ref 135–145)
Total Bilirubin: 0.5 mg/dL (ref 0.0–1.2)
Total Protein: 6.1 g/dL — ABNORMAL LOW (ref 6.5–8.1)

## 2024-07-21 LAB — CBC
HCT: 34.5 % — ABNORMAL LOW (ref 36.0–46.0)
Hemoglobin: 11 g/dL — ABNORMAL LOW (ref 12.0–15.0)
MCH: 28.6 pg (ref 26.0–34.0)
MCHC: 31.9 g/dL (ref 30.0–36.0)
MCV: 89.8 fL (ref 80.0–100.0)
Platelets: 375 K/uL (ref 150–400)
RBC: 3.84 MIL/uL — ABNORMAL LOW (ref 3.87–5.11)
RDW: 13.7 % (ref 11.5–15.5)
WBC: 19.5 K/uL — ABNORMAL HIGH (ref 4.0–10.5)
nRBC: 0 % (ref 0.0–0.2)

## 2024-07-21 LAB — URINALYSIS, ROUTINE W REFLEX MICROSCOPIC
Bilirubin Urine: NEGATIVE
Glucose, UA: NEGATIVE mg/dL
Hgb urine dipstick: NEGATIVE
Ketones, ur: NEGATIVE mg/dL
Nitrite: NEGATIVE
Protein, ur: NEGATIVE mg/dL
Specific Gravity, Urine: 1.018 (ref 1.005–1.030)
WBC, UA: >50 WBC/hpf (ref 0–5)
pH: 5 (ref 5.0–8.0)

## 2024-07-21 LAB — LIPASE, BLOOD: Lipase: 22 U/L (ref 11–51)

## 2024-07-21 MED ORDER — ONDANSETRON HCL 4 MG/2ML IJ SOLN
4.0000 mg | Freq: Once | INTRAMUSCULAR | Status: AC
  Administered 2024-07-21: 4 mg via INTRAVENOUS
  Filled 2024-07-21: qty 2

## 2024-07-21 MED ORDER — IOHEXOL 300 MG/ML  SOLN
75.0000 mL | Freq: Once | INTRAMUSCULAR | Status: AC | PRN
  Administered 2024-07-21: 75 mL via INTRAVENOUS

## 2024-07-21 MED ORDER — FENTANYL CITRATE (PF) 50 MCG/ML IJ SOSY
50.0000 ug | PREFILLED_SYRINGE | Freq: Once | INTRAMUSCULAR | Status: AC
  Administered 2024-07-21: 50 ug via INTRAVENOUS
  Filled 2024-07-21: qty 1

## 2024-07-21 NOTE — ED Notes (Signed)
 Patient transported to CT

## 2024-07-21 NOTE — Discharge Instructions (Signed)
 We discussed the CT results on today's visit.  Please obtain some MiraLAX , take 1 capful with an 8 ounce glass of water  in the morning, afternoon, nighttime until some stool is present.  If you experience any fever, worsening symptoms please return to the emergency department.

## 2024-07-21 NOTE — ED Provider Notes (Signed)
 [place] EMERGENCY DEPARTMENT AT [facility] Provider Note   CSN: [id number] Arrival date & time: 07/21/24  1746     Patient presents with: Abdominal Pain   [name] is a 63 y.o. female.   63 year old with a past medical history of diabetes, gout, hypertension presents to the ED with a chief complaint of generalized abdominal pain which has been ongoing for the past 2 days.  Patient describes this as a cramping, aching feeling, there is no alleviating or exacerbating factors.  She did take some Tylenol  prior to arrival in the emergency department with some improvement in symptoms.  She reports her last oral intake was prior to arrival in the ED consisting of a small slice of pizza.  Her last bowel movement was yesterday, she did not notice any blood in her stool.  Does have a prior history of a hernia repair but this happened a few years back.  She has tried some reflux medication as well without any improvement.  She denies any fever, urinary symptoms, recent travel, recent antibiotic use.  The history is provided by the patient.  Abdominal Pain Pain location:  Generalized Pain quality: aching and cramping   Pain radiates to:  Does not radiate Pain severity:  Moderate Onset quality:  Gradual Duration:  2 days Timing:  Intermittent Progression:  Unchanged Chronicity:  New Context: not diet changes, not recent illness and not recent sexual activity   Associated symptoms: nausea   Associated symptoms: no chest pain, no chills, no fever, no shortness of breath and no vomiting        Prior to Admission medications   Medication Sig Start Date End Date Taking? Authorizing Provider  albuterol  (PROVENTIL ) (2.5 MG/3ML) 0.083% nebulizer solution Take 3 mLs (2.5 mg total) by nebulization every 6 (six) hours as needed for wheezing or shortness of breath (Use 2-3 times per day when sick.). 07/04/24   Lucius Krabbe, NP  albuterol  (VENTOLIN  HFA) 108 (90 Base) MCG/ACT  inhaler Inhale 1-2 puffs into the lungs every 4 (four) hours as needed for wheezing or shortness of breath. 01/02/24   Job Lukes, PA  aspirin  EC 81 MG tablet Take 1 tablet (81 mg total) by mouth daily. 11/03/20   Anner Alm ORN, MD  atorvastatin  (LIPITOR) 40 MG tablet Take 1 tablet (40 mg total) by mouth daily. 09/19/23   Katrinka Garnette KIDD, MD  benzonatate  (TESSALON  PERLES) 100 MG capsule Take 1-2 capsules (100-200 mg total) by mouth 3 (three) times daily as needed for cough. 07/09/24   Katrinka Garnette KIDD, MD  buPROPion  (WELLBUTRIN  XL) 300 MG 24 hr tablet TAKE 1 TABLET BY MOUTH EVERY DAY 01/11/24   Katrinka Garnette KIDD, MD  Calcium  Carbonate (CALTRATE 600 PO) Orally Once a day for 30 day(s)    [provider]  chlorpheniramine -HYDROcodone  (TUSSIONEX) 10-8 MG/5ML Take 5 mLs by mouth at bedtime as needed for cough (do not drive for 8 hours after taking). 07/09/24   Katrinka Garnette KIDD, MD  Continuous Glucose Sensor (DEXCOM G7 SENSOR) MISC 1 DEVICE BY DOES NOT APPLY ROUTE AS DIRECTED. CHANGE SENSOR EVERY 10 DAYS 04/16/24   Dartha Ernst, MD  cyclobenzaprine  (FLEXERIL ) 5 MG tablet Take 1-2 tablets (5-10 mg total) by mouth 3 (three) times daily as needed for muscle spasms (May cause drowsiness). 04/16/24   Lucius Krabbe, NP  diltiazem  (CARDIZEM  CD) 120 MG 24 hr capsule TAKE 1 CAPSULE BY MOUTH EVERY DAY 03/21/24   Goodrich, Callie E, PA-C  estradiol  (ESTRACE )  0.1 MG/GM vaginal cream Place 0.5 Applicatorfuls vaginally daily as needed (irritation).    [provider]  famotidine  (PEPCID ) 20 MG tablet TAKE 1 TABLET BY MOUTH TWICE A DAY 02/08/24   Katrinka Garnette KIDD, MD  fenofibrate  160 MG tablet TAKE 1 TABLET BY MOUTH EVERY DAY 06/14/24   Anner Alm ORN, MD  fluticasone  (FLONASE ) 50 MCG/ACT nasal spray Place 2 sprays into both nostrils daily as needed for allergies. 08/11/23   Von Bellis, MD  fluticasone  furoate-vilanterol (BREO ELLIPTA ) 200-25 MCG/ACT AEPB Inhale 1 puff into the lungs daily.  07/09/24   Katrinka Garnette KIDD, MD  furosemide  (LASIX ) 40 MG tablet Take 1 tablet (40 mg total) by mouth daily. 02/20/24   Katrinka Garnette KIDD, MD  gabapentin  (NEURONTIN ) 100 MG capsule TAKE 1 CAPSULE BY MOUTH THREE TIMES A DAY 09/06/23   Katrinka Garnette KIDD, MD  glucose blood (CONTOUR NEXT TEST) test strip 1 each by Other route 4 (four) times daily. And lancets 4/day 09/19/19   Kassie Mallick, MD  guaiFENesin -dextromethorphan  (ROBITUSSIN DM) 100-10 MG/5ML syrup Take 5 mLs by mouth every 4 (four) hours as needed for cough (chest congestion). 08/11/23   Von Bellis, MD  Insulin  Pen Needle (PEN NEEDLES) 32G X 4 MM MISC 1 each by Does not apply route 4 (four) times daily. E11.9 02/22/20   Kassie Mallick, MD  ipratropium (ATROVENT ) 0.06 % nasal spray PLACE 2 SPRAYS INTO BOTH NOSTRILS 4 TIMES DAILY. 05/11/23   Katrinka Garnette KIDD, MD  KLOR-CON  M20 20 MEQ tablet TAKE 1 & 1/2 TABLETS BY MOUTH TWICE A DAY 10/14/23   Katrinka Garnette KIDD, MD  Lancet Devices (EASY MINI EJECT LANCING DEVICE) MISC Use as directed 4 times daily to test blood sugar 11/04/20   Kassie Mallick, MD  LANTUS  SOLOSTAR 100 UNIT/ML Solostar Pen Inject 24 Units into the skin daily.    [provider]  metFORMIN  (GLUCOPHAGE -XR) 500 MG 24 hr tablet Take 1 tablet (500 mg total) by mouth 2 (two) times daily with a meal. 05/21/24 08/19/24  Motwani, Obadiah, MD  Microlet Lancets MISC 1 each by Does not apply route 2 (two) times daily. E11.9 10/03/23   Motwani, Komal, MD  ondansetron  (ZOFRAN -ODT) 4 MG disintegrating tablet Take 1 tablet (4 mg total) by mouth every 8 (eight) hours as needed for nausea or vomiting. 03/21/24   Job Lukes, PA  pantoprazole  (PROTONIX ) 40 MG tablet TAKE 1 TABLET BY MOUTH TWICE A DAY 04/06/24   Abran Norleen SAILOR, MD  phenazopyridine  (PYRIDIUM ) 100 MG tablet Take 1 tablet (100 mg total) by mouth 3 (three) times daily as needed for pain. 06/01/24   Lucius Krabbe, NP  predniSONE  (DELTASONE ) 20 MG tablet Take 2 pills for 3 days, 1 pill  for 4 days 07/09/24   Katrinka Garnette KIDD, MD  telmisartan  (MICARDIS ) 40 MG tablet TAKE 1 TABLET BY MOUTH EVERY DAY 12/19/23   Katrinka Garnette KIDD, MD  tirzepatide  (MOUNJARO ) 12.5 MG/0.5ML Pen Inject 12.5 mg into the skin once a week. 05/21/24   Motwani, Komal, MD  traMADol  (ULTRAM ) 50 MG tablet Take 1 tablet (50 mg total) by mouth every 6 (six) hours as needed. 03/26/24 03/26/25  Rubin Calamity, MD  venlafaxine  XR (EFFEXOR -XR) 75 MG 24 hr capsule TAKE 1 CAPSULE BY MOUTH TWICE A DAY 04/10/24   Katrinka Garnette KIDD, MD  mupirocin  nasal ointment (BACTROBAN ) 2 % Place 1 application into the nose 2 (two) times daily. Use one-half of tube in each nostril twice daily for five (5) days.  After application, press sides of nose together and gently massage. 03/28/18 03/28/18  Katrinka Garnette KIDD, MD    Allergies: Firvanq  [vancomycin ], Methotrexate, Lisinopril , Chlorhexidine , Cleocin  [clindamycin ], Lincocin [lincomycin hcl], and Teflaro [ceftaroline]    Review of Systems  Constitutional:  Negative for chills and fever.  Respiratory:  Negative for shortness of breath.   Cardiovascular:  Negative for chest pain.  Gastrointestinal:  Positive for abdominal pain and nausea. Negative for vomiting.  Musculoskeletal:  Negative for back pain.  All other systems reviewed and are negative.   Updated Vital Signs BP (!) 162/75   Pulse 86   Temp 98.6 F (37 C)   Resp 19   SpO2 94%   Physical Exam Vitals and nursing note reviewed.  Constitutional:      Appearance: She is well-developed.  HENT:     Head: Normocephalic and atraumatic.  Cardiovascular:     Rate and Rhythm: Normal rate.  Pulmonary:     Effort: Pulmonary effort is normal.     Breath sounds: No wheezing.  Abdominal:     General: Abdomen is flat. Bowel sounds are increased.     Palpations: Abdomen is soft.     Tenderness: There is generalized abdominal tenderness. There is no right CVA tenderness or left CVA tenderness.  Skin:    General: Skin is warm and  dry.  Neurological:     Mental Status: She is alert and oriented to person, place, and time.     (all labs ordered are listed, but only abnormal results are displayed) Labs Reviewed  COMPREHENSIVE METABOLIC PANEL WITH GFR - Abnormal; Notable for the following components:      Result Value   Glucose, Bld 178 (*)    BUN 28 (*)    Creatinine, Ser 1.61 (*)    Total Protein 6.1 (*)    GFR, Estimated 35 (*)    All other components within normal limits  CBC - Abnormal; Notable for the following components:   WBC 19.5 (*)    RBC 3.84 (*)    Hemoglobin 11.0 (*)    HCT 34.5 (*)    All other components within normal limits  URINALYSIS, ROUTINE W REFLEX MICROSCOPIC - Abnormal; Notable for the following components:   APPearance CLOUDY (*)    Leukocytes,Ua MODERATE (*)    Bacteria, UA RARE (*)    All other components within normal limits  URINE CULTURE  LIPASE, BLOOD    EKG: None  Radiology: CT ABDOMEN PELVIS W CONTRAST Result Date: 07/21/2024 CLINICAL DATA:  Abdominal pain, bilateral flank pain, nausea EXAM: CT ABDOMEN AND PELVIS WITH CONTRAST TECHNIQUE: Multidetector CT imaging of the abdomen and pelvis was performed using the standard protocol following bolus administration of intravenous contrast. RADIATION DOSE REDUCTION: This exam was performed according to the departmental dose-optimization program which includes automated exposure control, adjustment of the mA and/or kV according to patient size and/or use of iterative reconstruction technique. CONTRAST:  75mL OMNIPAQUE  IOHEXOL  300 MG/ML  SOLN COMPARISON:  03/21/2024 FINDINGS: Lower chest: No acute findings Hepatobiliary: No focal hepatic abnormality. Gallbladder unremarkable. Pancreas: No focal abnormality or ductal dilatation. Spleen: No focal abnormality.  Normal size. Adrenals/Urinary Tract: No adrenal abnormality. No focal renal abnormality. No stones or hydronephrosis. Urinary bladder is unremarkable. Stomach/Bowel: Stomach,  large and small bowel grossly unremarkable. Moderate stool burden. Vascular/Lymphatic: Aortic atherosclerosis. No evidence of aneurysm or adenopathy. Reproductive: Prior hysterectomy.  No adnexal masses. Other: No free fluid or free air.  Prior ventral hernia repair. Musculoskeletal: No  acute bony abnormality. IMPRESSION: No acute findings in the abdomen or pelvis. Moderate stool burden. Aortic atherosclerosis. Electronically Signed   By: Franky Crease M.D.   On: 07/21/2024 21:35     Procedures   Medications Ordered in the ED  ondansetron  (ZOFRAN ) injection 4 mg (4 mg Intravenous Given 07/21/24 1917)  fentaNYL  (SUBLIMAZE ) injection 50 mcg (50 mcg Intravenous Given 07/21/24 1917)  iohexol  (OMNIPAQUE ) 300 MG/ML solution 75 mL (75 mLs Intravenous Contrast Given 07/21/24 2103)    Clinical Course as of 07/21/24 2209  Sat Jul 21, 2024  [address]): MODERATE [JS]  2145 Bacteria, UA(!): RARE [JS]    Clinical Course User Index [JS] Brenten Janney, PA-C                                 Medical Decision Making Amount and/or Complexity of Data Reviewed Labs: ordered. Decision-making details documented in ED Course. Radiology: ordered.  Risk Prescription drug management.   This patient presents to the ED for concern of abdominal pain, this involves a number of treatment options, and is a complaint that carries with it a high risk of complications and morbidity.  The differential diagnosis includes obstruction, cholecystitis, appendicitis.   Co morbidities: Discussed in HPI   Brief History:  See HPI.   EMR reviewed including pt PMHx, past surgical history and past visits to ER.   See HPI for more details   Lab Tests:  I ordered and independently interpreted labs.  The pertinent results include:    CBC with a leukocytosis of 19.5, hemoglobin is at her baseline.  CMP with no electrolyte derangement, creatinine level is slightly elevated from baseline.  LFTs are within normal  limits.  Lipase level is normal.  UA with moderate leukocytes, greater than 50 white blood cell count along with rare bacteria, will send this for culture due to suprapubic pain.   Imaging Studies:  CT abdomen and pelvis showed: IMPRESSION:  No acute findings in the abdomen or pelvis.    Moderate stool burden.   Medicines ordered:  I ordered medication including fentanyl , Zofran  for symptomatic treatment Reevaluation of the patient after these medicines showed that the patient improved I have reviewed the patients home medicines and have made adjustments as needed  Reevaluation:  After the interventions noted above I re-evaluated patient and found that they have :improved  Social Determinants of Health:  The patient's social determinants of health were a factor in the care of this patient  Problem List / ED Course:  Patient present to the ED with a chief complaint of abdominal pain this is been ongoing for the last couple days, reports that last oral intake was pizza a couple of bites prior to arrival in the emergency department.  She has not had any nausea, no vomiting.  There is no alleviating or abating factors.  Blood work on today's visit CBC with leukocytosis of 19.5, hemoglobin is slightly decreased.  CMP with no electrolyte derangement, creatinine level was elevated at 1.61 along with BUN also elevated.  UA with some moderate leukocytes, greater than 10 white blood cell count, will send this for culture as she is not having any urinary symptoms at this time.  Over, she is here with some suprapubic pain along with generalized abdominal pain. Abdominal exam showed, with no focal point of tenderness at this time.  CT abdomen and pelvis was ordered which only showed moderate stool burden.  She has a prior history of hernia repair, no incarcerations or strangulation's at this time.  She did receive some fentanyl  along with Zofran  with improvement in her pain.  We did discuss pushing  fluids, obtaining some MiraLAX , along with changing diet to help with stool passage.  She is agreeable to plan and treatment at this time, hemodynamically stable for discharge.   Dispostion:  After consideration of the diagnostic results and the patients response to treatment, I feel that the patent would benefit from parallax, close follow-up with PCP or return if worsening.    Portions of this note were generated with Scientist, clinical (histocompatibility and immunogenetics). Dictation errors may occur despite best attempts at proofreading.  Final diagnoses:  Constipation, unspecified constipation type  Generalized abdominal pain    ED Discharge Orders     None          Maureen Broad, PA-C 07/21/24 2209    Suzette Pac, MD 07/23/24 [phone]

## 2024-07-21 NOTE — ED Triage Notes (Signed)
 Pt reports generalized abdominal pain, nausea, and bilateral flank pain that started yesterday. Pt denies fevers.

## 2024-07-23 ENCOUNTER — Encounter: Payer: Self-pay | Admitting: Family Medicine

## 2024-07-24 ENCOUNTER — Ambulatory Visit (INDEPENDENT_AMBULATORY_CARE_PROVIDER_SITE_OTHER): Admitting: Family Medicine

## 2024-07-24 ENCOUNTER — Encounter: Payer: Self-pay | Admitting: Family Medicine

## 2024-07-24 VITALS — BP 124/58 | HR 77 | Temp 98.1°F | Ht 66.0 in | Wt 183.8 lb

## 2024-07-24 DIAGNOSIS — N3001 Acute cystitis with hematuria: Secondary | ICD-10-CM

## 2024-07-24 DIAGNOSIS — K5900 Constipation, unspecified: Secondary | ICD-10-CM

## 2024-07-24 LAB — URINE CULTURE: Culture: >=100000 — AB

## 2024-07-24 MED ORDER — CEPHALEXIN 500 MG PO CAPS: 500.0000 mg | ORAL_CAPSULE | Freq: Three times a day (TID) | ORAL | 0 refills | Status: AC

## 2024-07-24 NOTE — Progress Notes (Signed)
 Phone [phone] In person visit   Subjective:   [name] is a 63 y.o. year old very pleasant female patient who presents for/with See problem oriented charting Chief Complaint  Patient presents with   Hospitalization Follow-up    Was seen in the er and now wants to discuss scan results;    Urinary Tract Infection    Pt thinks she has a UTI; urine culture and urinalysis in chart done 07/21/2024; still having dysuria and burning and frequency;    Past Medical History-  Patient Active Problem List   Diagnosis Date Noted   Severe persistent asthma (HCC) 01/04/2019    Priority: High   Subcortical microvascular ischemic occlusive disease 07/12/2018    Priority: High   Chronic diastolic CHF (congestive heart failure) (HCC) 08/13/2017    Priority: High   Pulmonary sarcoidosis     Priority: High   Chronic bronchitis (HCC) in never smoker 09/18/2013    Priority: High   Chest pain 04/11/2013    Priority: High   Hypertensive heart disease with chronic diastolic congestive heart failure (HCC)     Priority: High   Sarcoidosis of lung 04/10/2007    Priority: High   Type II diabetes mellitus with renal manifestations (HCC) 08/21/2006    Priority: High   Iron  deficiency anemia 10/01/2020    Priority: Medium    Hypertriglyceridemia 04/02/2020    Priority: Medium    Aortic atherosclerosis 02/14/2020    Priority: Medium    Constipation 04/21/2018    Priority: Medium    Epiglottitis     Priority: Medium    Osteoarthritis of right knee 01/26/2017    Priority: Medium    Chronic kidney disease, stage 3b (HCC) 04/27/2015    Priority: Medium    History of osteomyelitis 03/27/2015    Priority: Medium    Fatty liver 09/30/2014    Priority: Medium    Major depression in full remission     Priority: Medium    Gout 08/20/2010    Priority: Medium    Deficiency anemia 09/18/2009    Priority: Medium    Sleep apnea 04/21/2009    Priority: Medium    Hyperlipidemia associated with  type 2 diabetes mellitus (HCC) 08/21/2006    Priority: Medium    Hypertension 08/21/2006    Priority: Medium    GERD 08/21/2006    Priority: Medium    Nontraumatic complete tear of left rotator cuff     Priority: Low   Diabetic neuropathy (HCC) 02/20/2019    Priority: Low   Rapid palpitations 07/12/2018    Priority: Low   Impingement of left ankle joint 06/26/2018    Priority: Low   Total knee replacement status, left 12/07/2017    Priority: Low   Oropharyngeal dysphagia 09/20/2017    Priority: Low   Plantar fasciitis, right 07/13/2017    Priority: Low   S/P total knee arthroplasty, right 06/29/2017    Priority: Low   Osteoarthrosis, localized, primary, knee, right     Priority: Low   Sprain of calcaneofibular ligament of right ankle 06/06/2017    Priority: Low   Onychomycosis 10/27/2015    Priority: Low   MRSA (methicillin resistant staph aureus) culture positive 03/27/2015    Priority: Low   Hot flashes 07/15/2014    Priority: Low   Abnormal SPEP 04/17/2014    Priority: Low   Fracture of left leg 04/17/2014    Priority: Low   Internal hemorrhoids     Priority: Low  Preoperative clearance 03/25/2014    Priority: Low   Solitary pulmonary nodule, on CT 02/2013 - stable over 2 years in 2015 02/20/2013    Priority: Low   Pain of left hand 04/19/2022    Priority: 1.   Facet arthropathy, cervical 06/29/2021    Priority: 1.   Foraminal stenosis of cervical region 06/29/2021    Priority: 1.   Impaired gait 04/05/2024   Decreased activities of daily living (ADL) 04/05/2024   Impaired functional mobility, balance, gait, and endurance 04/05/2024   Left carpal tunnel syndrome 03/24/2023   Arthritis of carpometacarpal [facility]) joint of left thumb 03/10/2023   Radial styloid tenosynovitis (de quervain) 03/10/2023   Trigger finger of left thumb 03/10/2023   Preop cardiovascular exam 12/07/2021   Subacromial bursitis of right shoulder joint 08/03/2021   Neck pain,  musculoskeletal 05/15/2021   Postural dizziness with presyncope 09/30/2020   Benign essential hypertension 08/07/2020   Obstructive chronic bronchitis with exacerbation (HCC) 09/18/2013    Medications- reviewed and updated Current Outpatient Medications  Medication Sig Dispense Refill   albuterol  (PROVENTIL ) (2.5 MG/3ML) 0.083% nebulizer solution Take 3 mLs (2.5 mg total) by nebulization every 6 (six) hours as needed for wheezing or shortness of breath (Use 2-3 times per day when sick.). 75 mL 2   albuterol  (VENTOLIN  HFA) 108 (90 Base) MCG/ACT inhaler Inhale 1-2 puffs into the lungs every 4 (four) hours as needed for wheezing or shortness of breath. 8.5 each 1   aspirin  EC 81 MG tablet Take 1 tablet (81 mg total) by mouth daily. 30 tablet 11   atorvastatin  (LIPITOR) 40 MG tablet Take 1 tablet (40 mg total) by mouth daily. 90 tablet 3   benzonatate  (TESSALON  PERLES) 100 MG capsule Take 1-2 capsules (100-200 mg total) by mouth 3 (three) times daily as needed for cough. 30 capsule 3   buPROPion  (WELLBUTRIN  XL) 300 MG 24 hr tablet TAKE 1 TABLET BY MOUTH EVERY DAY 90 tablet 3   Calcium  Carbonate (CALTRATE 600 PO) Orally Once a day for 30 day(s)     cephALEXin  (KEFLEX ) 500 MG capsule Take 1 capsule (500 mg total) by mouth 3 (three) times daily for 7 days. 21 capsule 0   chlorpheniramine -HYDROcodone  (TUSSIONEX) 10-8 MG/5ML Take 5 mLs by mouth at bedtime as needed for cough (do not drive for 8 hours after taking). 115 mL 0   Continuous Glucose Sensor (DEXCOM G7 SENSOR) MISC 1 DEVICE BY DOES NOT APPLY ROUTE AS DIRECTED. CHANGE SENSOR EVERY 10 DAYS 3 each 3   cyclobenzaprine  (FLEXERIL ) 5 MG tablet Take 1-2 tablets (5-10 mg total) by mouth 3 (three) times daily as needed for muscle spasms (May cause drowsiness). 30 tablet 0   diltiazem  (CARDIZEM  CD) 120 MG 24 hr capsule TAKE 1 CAPSULE BY MOUTH EVERY DAY 90 capsule 3   estradiol  (ESTRACE ) 0.1 MG/GM vaginal cream Place 0.5 Applicatorfuls vaginally daily as  needed (irritation).     famotidine  (PEPCID ) 20 MG tablet TAKE 1 TABLET BY MOUTH TWICE A DAY 180 tablet 1   fenofibrate  160 MG tablet TAKE 1 TABLET BY MOUTH EVERY DAY 90 tablet 2   fluticasone  (FLONASE ) 50 MCG/ACT nasal spray Place 2 sprays into both nostrils daily as needed for allergies. 48 mL 3   fluticasone  furoate-vilanterol (BREO ELLIPTA ) 200-25 MCG/ACT AEPB Inhale 1 puff into the lungs daily. 3 each 3   furosemide  (LASIX ) 40 MG tablet Take 1 tablet (40 mg total) by mouth daily. 90 tablet 3   gabapentin  (NEURONTIN ) 100  MG capsule TAKE 1 CAPSULE BY MOUTH THREE TIMES A DAY 270 capsule 3   glucose blood (CONTOUR NEXT TEST) test strip 1 each by Other route 4 (four) times daily. And lancets 4/day 400 each 3   guaiFENesin -dextromethorphan  (ROBITUSSIN DM) 100-10 MG/5ML syrup Take 5 mLs by mouth every 4 (four) hours as needed for cough (chest congestion). 118 mL 0   Insulin  Pen Needle (PEN NEEDLES) 32G X 4 MM MISC 1 each by Does not apply route 4 (four) times daily. E11.9 400 each 0   ipratropium (ATROVENT ) 0.06 % nasal spray PLACE 2 SPRAYS INTO BOTH NOSTRILS 4 TIMES DAILY. 45 mL 3   KLOR-CON  M20 20 MEQ tablet TAKE 1 & 1/2 TABLETS BY MOUTH TWICE A DAY 270 tablet 2   Lancet Devices (EASY MINI EJECT LANCING DEVICE) MISC Use as directed 4 times daily to test blood sugar 1 each 3   LANTUS  SOLOSTAR 100 UNIT/ML Solostar Pen Inject 24 Units into the skin daily.     metFORMIN  (GLUCOPHAGE -XR) 500 MG 24 hr tablet Take 1 tablet (500 mg total) by mouth 2 (two) times daily with a meal. 180 tablet 0   Microlet Lancets MISC 1 each by Does not apply route 2 (two) times daily. E11.9 100 each 2   ondansetron  (ZOFRAN -ODT) 4 MG disintegrating tablet Take 1 tablet (4 mg total) by mouth every 8 (eight) hours as needed for nausea or vomiting. 20 tablet 0   pantoprazole  (PROTONIX ) 40 MG tablet TAKE 1 TABLET BY MOUTH TWICE A DAY 180 tablet 1   phenazopyridine  (PYRIDIUM ) 100 MG tablet Take 1 tablet (100 mg total) by mouth 3  (three) times daily as needed for pain. 10 tablet 0   telmisartan  (MICARDIS ) 40 MG tablet TAKE 1 TABLET BY MOUTH EVERY DAY 90 tablet 3   tirzepatide  (MOUNJARO ) 12.5 MG/0.5ML Pen Inject 12.5 mg into the skin once a week. 6 mL 0   traMADol  (ULTRAM ) 50 MG tablet Take 1 tablet (50 mg total) by mouth every 6 (six) hours as needed. 20 tablet 0   venlafaxine  XR (EFFEXOR -XR) 75 MG 24 hr capsule TAKE 1 CAPSULE BY MOUTH TWICE A DAY 180 capsule 3   No current facility-administered medications for this visit.     Objective:  BP (!) 124/58 (BP Location: Left Arm, Patient Position: Sitting, Cuff Size: Normal)   Pulse 77   Temp 98.1 F (36.7 C) (Temporal)   Ht 5' 6 (1.676 m)   Wt 183 lb 12.8 oz (83.4 kg)   SpO2 99%   BMI 29.67 kg/m  Gen: NAD, resting comfortably CV: RRR no murmurs rubs or gallops Lungs: CTAB no crackles, wheeze, rhonchi Abdomen: Rather tense in upper abdomen.  Suprapubic tenderness noted rather significant.  No rebound or guarding.  Ext: no edema Skin: warm, dry     Assessment and Plan   # ER follow up for generalized abdominal pain S: Patient presented to the emergency department on 07/21/2024 with complaint of generalized abdominal pain ongoing for 2 days described as a cramping aching feeling with no alleviating or exacerbating factors other than mild relief with Tylenol .  She reported a bowel movement the day prior.  She tried some reflux medicine without any improvement. -CT abdomen pelvis 07/21/24 with moderate stool burden but otherwise no acute findings -UA with 6-10 RBC and urine culture showed E. Coli but she had not been contacted yet about results -creatinine up to 1.61 but she has also had [place] of fluctuation in the last year  with values anywhere from 1-1.7 -did have leukocytosis but this is rather typical for her with chronic inflammation.   Since being home has tried miralax  up to 3 times a day- small bowel movement with that. Has increased fluid. No loose  stool   Suprapubic abdominal pain has persisted and pain up to 7/10- can even affect walking.  She is also feeling some upper abdominal cramping A/P: CT scan with suggestion of constipation-reviewed images and a lot of this stool burden seems to be in her upper abdomen when she is rather tight in that area-encouraged her to continue MiraLAX  3 times a day for now and hold for loose stools  She also has suprapubic tenderness and urine culture showing E. coli sensitive to cephalexin  that she has tolerated this medication before despite alternate Supples on allergy and we opted to retry this.  She will follow-up if symptoms fail to improve - There was blood on urinalysis and we discussed next visit needing to recheck this    Recommended follow up: Return for as needed for new, worsening, persistent symptoms. Future Appointments  Date Time Provider Department Center  08/16/2024 10:15 AM [facility] None  08/21/2024  9:20 AM Dartha Ernst, MD [facility] None  08/22/2024  9:30 AM Parrett, Madelin RAMAN, NP [facility] [id number]  01/08/2025  1:00 PM Katrinka Garnette KIDD, MD [facility]  04/09/2025  1:00 PM [facility] ANNUAL WELLNESS VISIT 1 [facility] [place]    Lab/Order associations:   ICD-10-CM   1. Constipation, unspecified constipation type  K59.00     2. Acute cystitis with hematuria  N30.01       Meds ordered this encounter  Medications   cephALEXin  (KEFLEX ) 500 MG capsule    Sig: Take 1 capsule (500 mg total) by mouth 3 (three) times daily for 7 days.    Dispense:  21 capsule    Refill:  0   I personally spent a total of 33 minutes in the care of the patient today including preparing to see the patient, getting/reviewing separately obtained history from Emergency Department discharge and summarizing, performing a medically appropriate exam/evaluation, counseling and educating, placing orders, and documenting clinical information in the EHR.  Return  precautions advised.  Garnette Katrinka, MD

## 2024-07-24 NOTE — Patient Instructions (Addendum)
 You did end up having urinary tract infection- sorry they have not had chance to call yet but I'm going of the go ahead and treat this with keflex /cephalexin  500 mg three times a day for 7 days. This may explain the abdominal pain- your next visit do need to do another urine because there was possible blood but that could just be from UTI  Recommended follow up: Return for as needed for new, worsening, persistent symptoms.

## 2024-07-25 ENCOUNTER — Telehealth (HOSPITAL_BASED_OUTPATIENT_CLINIC_OR_DEPARTMENT_OTHER): Payer: Self-pay | Admitting: *Deleted

## 2024-07-25 NOTE — Telephone Encounter (Signed)
 Post ED Visit - Positive Culture Follow-up  Culture report reviewed by antimicrobial stewardship pharmacist: [facility] []  Rankin Dee, Pharm.D. []  Venetia Gully, Pharm.D., BCPS AQ-ID []  Garrel Crews, Pharm.D., BCPS []  Almarie Lunger, Pharm.D., BCPS []  [place], [address].D., BCPS, AAHIVP []  Rosaline Bihari, Pharm.D., BCPS, AAHIVP []  Vernell Meier, PharmD, BCPS []  Latanya Hint, PharmD, BCPS []  Donald Medley, PharmD, BCPS []  Rocky Bold, PharmD []  Dorothyann Alert, PharmD, BCPS []  Morene Babe, PharmD  [facility] [x]  Rodericks Fobbs, PharmD []  Romona Bliss, PharmD []  Dolphus Roller, PharmD []  Veva Seip, Rph []  Vernell Daunt) Leonce, PharmD []  Eva Allis, PharmD []  Rosaline Millet, PharmD []  Iantha Batch, PharmD []  Arvin Gauss, PharmD []  Wanda Hasting, PharmD []  Ronal Rav, PharmD []  Rocky Slade, PharmD []  Bard Jeans, PharmD   Positive urine culture Treated with keflex , organism sensitive to the same and no further patient follow-up is required at this time.  Lorita Barnie Pereyra 07/25/2024, 11:47 AM

## 2024-07-27 ENCOUNTER — Encounter: Payer: Self-pay | Admitting: Family Medicine

## 2024-07-27 MED ORDER — FLUCONAZOLE 150 MG PO TABS: 150.0000 mg | ORAL_TABLET | Freq: Once | ORAL | 0 refills | Status: AC

## 2024-08-13 ENCOUNTER — Ambulatory Visit (INDEPENDENT_AMBULATORY_CARE_PROVIDER_SITE_OTHER)

## 2024-08-13 ENCOUNTER — Encounter: Payer: Self-pay | Admitting: Podiatry

## 2024-08-13 ENCOUNTER — Ambulatory Visit (INDEPENDENT_AMBULATORY_CARE_PROVIDER_SITE_OTHER): Admitting: Podiatry

## 2024-08-13 ENCOUNTER — Ambulatory Visit: Admitting: Family Medicine

## 2024-08-13 VITALS — Ht 66.0 in | Wt 183.3 lb

## 2024-08-13 DIAGNOSIS — M2141 Flat foot [pes planus] (acquired), right foot: Secondary | ICD-10-CM

## 2024-08-13 DIAGNOSIS — M898X7 Other specified disorders of bone, ankle and foot: Secondary | ICD-10-CM | POA: Diagnosis not present

## 2024-08-13 NOTE — Progress Notes (Signed)
 Chief Complaint  Patient presents with   Foot Pain    [name] here due to pain in the right foot she states this has been going on for a while, the pain is at the top of the foot and along the side, has a visible knot to the side of the foot.    Subjective:  63 year old female PMHx T2DM with chronic foot pain and history of multiple surgeries presenting for evaluation of continued chronic foot pain especially to the lateral column of the right foot.  We have tried different conservative modalities including cortisone injections, shoe gear modifications, diabetic shoes with custom molded insoles, but none seem to alleviate her symptoms or pain.  She continues to have pain on a daily basis to the right foot.  Past Medical History:  Diagnosis Date   Abnormal SPEP 04/17/2014   Acute left ankle pain 01/26/2017   Allergy    ANEMIA-UNSPECIFIED 09/18/2009   Anxiety    Arthritis    Blood transfusion without reported diagnosis    Cataract    CHF (congestive heart failure) (HCC)    Chronic diastolic heart failure, NYHA class 2 (HCC)    Normal LVEDP by May 2018   COPD (chronic obstructive pulmonary disease) (HCC)    Depression 2000   DIABETES MELLITUS, TYPE II 08/21/2006   Diabetic osteomyelitis (HCC) 05/29/2015   Fatty liver    Fracture of 5th metatarsal    non union   GERD 08/21/2006   Had fundoplication   GOUT 08/20/2010   Hammer toe of right foot    2-5th toes   Hx of umbilical hernia repair    HYPERLIPIDEMIA 08/21/2006   HYPERTENSION 08/21/2006   Infection of prosthetic left knee joint 08/29/2020   Infection of wound due to methicillin resistant Staphylococcus aureus (MRSA)    Internal hemorrhoids    Kidney problem    Multiple allergies 10/14/2016   OBESITY 06/04/2009   Onychomycosis 10/27/2015   Osteomyelitis of left foot (HCC) 05/29/2015   Osteoporosis    Pulmonary sarcoidosis    Followed locally by pulmonology, but also by Dr. Lavella at [facility]   Right  knee pain 01/26/2017   Vocal cord dysfunction    Wears partial dentures     Past Surgical History:  Procedure Laterality Date   ABDOMINAL HYSTERECTOMY     APPENDECTOMY     BLADDER SUSPENSION  11/11/2011   Procedure: TRANSVAGINAL TAPE (TVT) PROCEDURE;  Surgeon: Oneil FORBES Piety, MD;  Location: [facility];  Service: Gynecology;  Laterality: N/A;   CAROTIDS  02/18/2011   CAROTID DUPLEX; VERTEBRALS ARE PATENT WITH ANTEGRADE FLOW. ICA/CCA RATIO 1.61 ON RIGHT AND 0.75 ON LEFT   CATARACT EXTRACTION, BILATERAL     CHOLECYSTECTOMY  1984   COLONOSCOPY  04/29/2010   [facility]   CYSTOSCOPY  11/11/2011   Procedure: CYSTOSCOPY;  Surgeon: Oneil FORBES Piety, MD;  Location: [facility];  Service: Gynecology;  Laterality: N/A;   EXTUBATION (ENDOTRACHEAL) IN OR N/A 09/23/2017   Procedure: EXTUBATION (ENDOTRACHEAL) IN OR;  Surgeon: Terri Alan PARAS, MD;  Location: [facility];  Service: ENT;  Laterality: N/A;   FIBEROPTIC LARYNGOSCOPY AND TRACHEOSCOPY N/A 09/23/2017   Procedure: FLEXIBLE FIBEROPTIC LARYNGOSCOPY;  Surgeon: Terri Alan PARAS, MD;  Location: [facility];  Service: ENT;  Laterality: N/A;   FRACTURE SURGERY     foot   HALLUX FUSION Left 10/02/2018   Procedure: HALLUX ARTHRODESIS;  Surgeon: Janit Thresa HERO, DPM;  Location: [facility];  Service: Podiatry;  Laterality:  Left;   HAMMER TOE SURGERY Left 10/02/2018   Procedure: HAMMER TOE CORRECTION 2ND 3RD 4RD FIFTH TOE;  Surgeon: Janit Thresa HERO, DPM;  Location: [facility];  Service: Podiatry;  Laterality: Left;   HAMMER TOE SURGERY Right 04/12/2019   Procedure: HAMMER TOE CORRECTION, 2ND, 3RD, 4TH AND 5TH TOES OF RIGHT FOOT;  Surgeon: Janit Thresa HERO, DPM;  Location: [facility];  Service: Podiatry;  Laterality: Right;   HAMMER TOE SURGERY Left 10/25/2019   Procedure: HAMMER TOE REPAIR SECOND, THIRD, FOUTH;  Surgeon: Janit Thresa HERO, DPM;  Location: [facility];  Service: Podiatry;  Laterality: Left;   HERNIA REPAIR     I & D EXTREMITY Left 06/27/2015   Procedure: Partial Excision Left  Calcaneus, Place Antibiotic Beads, and Wound VAC;  Surgeon: Jerona Harden GAILS, MD;  Location: [facility];  Service: Orthopedics;  Laterality: Left;   JOINT REPLACEMENT     KNEE ARTHROSCOPY     right   LAPAROSCOPIC ASSISTED VENTRAL HERNIA REPAIR N/A 03/26/2024   Procedure: REPAIR, HERNIA, VENTRAL, LAPAROSCOPY-ASSISTED;  Surgeon: Rubin Calamity, MD;  Location: [facility];  Service: General;  Laterality: N/A;  LAP VENTRAL HERNIA REPAIR WITH MESH   LAPAROSCOPIC LYSIS OF ADHESIONS N/A 03/26/2024   Procedure: LYSIS, ADHESIONS, LAPAROSCOPIC;  Surgeon: Rubin Calamity, MD;  Location: [facility] OR;  Service: General;  Laterality: N/A;   LAPAROSCOPY N/A 03/26/2024   Procedure: LAPAROSCOPY, DIAGNOSTIC;  Surgeon: Rubin Calamity, MD;  Location: [facility] OR;  Service: General;  Laterality: N/A;   LEFT AND RIGHT HEART CATHETERIZATION WITH CORONARY ANGIOGRAM N/A 04/23/2013   Procedure: LEFT AND RIGHT HEART CATHETERIZATION WITH CORONARY ANGIOGRAM;  Surgeon: Alm LELON Clay, MD;  Location: [facility] CATH LAB;  Service: Cardiovascular;  Laterality: N/A;   LEFT HEART CATH AND CORONARY ANGIOGRAPHY N/A 03/11/2017   Procedure: Left Heart Cath and Coronary Angiography;  Surgeon: Wonda Sharper, MD;  Location: [facility] INVASIVE CV LAB; angiographically minimal CAD in the LAD otherwise normal.;  Normal LVEDP.  FALSE POSITIVE MYOVIEW    LEFT HEART CATH AND CORONARY ANGIOGRAPHY  07/20/2010   LVEF 50-55% WITH VERY MILD GLOBAL HYPOKINESIA; ESSENTIALLY NORMAL CORONARY ARTERIES; NORMAL LV FUNCTION   METATARSAL OSTEOTOMY WITH OPEN REDUCTION INTERNAL FIXATION (ORIF) METATARSAL WITH FUSION Left 04/09/2014   Procedure: LEFT FOOT FRACTURE OPEN TREATMENT METATARSAL INCLUDES INTERNAL FIXATION EACH;  Surgeon: Lamar DELENA Millman, MD;  Location: [facility];  Service: Orthopedics;  Laterality: Left;   NISSEN FUNDOPLICATION  2004   NM MYOVIEW  LTD --> FALSE POSITIVE  03/10/2017   : Moderate size stress-induced perfusion defect at the apex as well as ill-defined  stress-induced perfusion defect in the lateral wall.  EF 55%.  INTERMEDIATE risk. -->  FALSE POSITIVE   ORIF DISTAL FEMUR FRACTURE  08/08/2020   [facility]: ORIF of left extra-articular periprosthetic distal femur fracture with synthesis of the femur plate with cortical nonlocking screws.  (Thought to be pathologic fracture due to osteoporosis) -> after a fall   ORIF FEMUR W/ PERI-IMPLANT  08/29/2020   [facility]) revision of left femur fracture ORIF plate fixation screws; culture positive for MSSA   Right and left CARDIAC CATHETERIZATION  04/23/2013   Angiographic normal coronaries; LVEDP 20 mmHg, PCWP 12-14 mmHg, RAP 12 mmHg.; Fick CO/CI 4.9/2.2   ROTATOR CUFF REPAIR Right 12/09/2021   SHOULDER ARTHROSCOPY Left 03/14/2019   Procedure: LEFT SHOULDER ARTHROSCOPY, DEBRIDEMENT, AND DECOMPRESSION;  Surgeon: Harden Jerona GAILS, MD;  Location: [facility];  Service: Orthopedics;  Laterality: Left;  TOTAL KNEE ARTHROPLASTY Right 06/29/2017   Procedure: RIGHT TOTAL KNEE ARTHROPLASTY;  Surgeon: Harden Jerona GAILS, MD;  Location: [facility] OR;  Service: Orthopedics;  Laterality: Right;   TOTAL KNEE ARTHROPLASTY Left 12/07/2017   TOTAL KNEE ARTHROPLASTY Left 12/07/2017   Procedure: LEFT TOTAL KNEE ARTHROPLASTY;  Surgeon: Harden Jerona GAILS, MD;  Location: [facility] OR;  Service: Orthopedics;  Laterality: Left;   TRACHEOSTOMY TUBE PLACEMENT N/A 09/20/2017   Procedure: AWAKE INTUBATION WITH ANESTHESIA WITH VIDEO ASSISTANCE;  Surgeon: Terri Alan PARAS, MD;  Location: [facility];  Service: ENT;  Laterality: N/A;   TRANSTHORACIC ECHOCARDIOGRAM  08/2014   Normal LV size and function.  Mild LVH.  EF 55-60%.  Normal regional wall motion.  GR 1 DD.  Normal RV size and function .   TRANSTHORACIC ECHOCARDIOGRAM  08/12/2020    [facility]): Normal LV size and function.  EF 55 to 60%.  No RWM A.  Normal filling pattern.  Normal RV.  No significant valvular disease-stenosis or regurgitation.  No vegetation.   TUBAL  LIGATION     with reversal in 1994   UPPER GASTROINTESTINAL ENDOSCOPY     VENTRAL HERNIA REPAIR      Allergies  Allergen Reactions   Firvanq  [Vancomycin ] Other (See Comments)    Dose related nephrotoxicity    Methotrexate Other (See Comments)    Peri-oral and buccal lesions   Lisinopril  Cough   Chlorhexidine  Itching   Cleocin  [Clindamycin ] Nausea And Vomiting and Rash   Lincocin [Lincomycin Hcl] Nausea And Vomiting and Rash   Teflaro [Ceftaroline] Rash and Other (See Comments)    Tolerates ceftriaxone     Objective: Physical Exam General: The patient is alert and oriented x3 in no acute distress.  Dermatology: Skin is cool, dry and supple bilateral lower extremities.  Hyperkeratotic preulcerative calluses noted around the fifth metatarsal tubercles of the bilateral feet  Vascular: Palpable pedal pulses bilaterally. No edema or erythema noted. Capillary refill within normal limits.  Neurological: Light touch and protective threshold diminished intact bilaterally.   Musculoskeletal Exam: History of multiple surgeries with pes planovalgus deformity bilateral.  With weightbearing there is excessive loading to the lateral column of the bilateral feet.  Significant pain and tenderness with palpation to the fifth metatarsal tubercle of the right foot today.  Radiographic exam RT foot 08/13/2024 Chronic degenerative changes noted to the bilateral feet with collapse of the medial longitudinal arch and pes planovalgus deformity.  Orthopedic hardware to the left foot appears stable and intact.  No acute fracture identified however there is chronic fracture at the base of the fourth metatarsal of the right foot  Assessment: 1.  Diabetes mellitus with peripheral polyneuropathy; controlled 2.  Pes planovalgus deformity bilateral 3.  History of multiple surgeries bilateral feet 4.  Painful symptomatic fifth metatarsal tubercle right foot  -Patient evaluated.  X-rays reviewed RT  foot -Unfortunately conservative management and treatment has been unsuccessful.  She has tried diabetic shoes with custom molded insoles, oral anti-inflammatories, cortisone injections, but there continues to be no significant improvement or lasting alleviation of her symptoms.  Today we did discuss surgical intervention which would include partial excision of the fifth metatarsal tubercle of the right foot.  Care would need to be taken to avoid any compromise to the insertion of the peroneus brevis tendon.  Today we discussed the risk benefits advantages and disadvantages of the procedure.  Also discussed the postoperative recovery course.  No guarantees were implied.  All patient questions were answered.  After discussion today she would like to proceed with surgery.  The postoperative recovery course was also explained.  She will likely be allowed to weight-bear in the cam boot for about 4 weeks -Authorization for surgery initiated today.  Surgery will consist of partial excision of fifth metatarsal tubercle right foot -Return to clinic 1 week postop  Thresa EMERSON Sar, DPM [facility]  Dr. Thresa EMERSON Sar, DPM    [address] [place], [place] [address]                Office [phone]  Fax [phone]

## 2024-08-14 ENCOUNTER — Encounter: Payer: Self-pay | Admitting: Family Medicine

## 2024-08-14 ENCOUNTER — Other Ambulatory Visit: Payer: Self-pay | Admitting: "Endocrinology

## 2024-08-14 ENCOUNTER — Ambulatory Visit: Admitting: Family Medicine

## 2024-08-14 VITALS — BP 120/68 | HR 87 | Temp 97.5°F | Ht 66.0 in | Wt 181.4 lb

## 2024-08-14 DIAGNOSIS — I10 Essential (primary) hypertension: Secondary | ICD-10-CM | POA: Diagnosis not present

## 2024-08-14 DIAGNOSIS — N1832 Chronic kidney disease, stage 3b: Secondary | ICD-10-CM

## 2024-08-14 DIAGNOSIS — J020 Streptococcal pharyngitis: Secondary | ICD-10-CM | POA: Diagnosis not present

## 2024-08-14 DIAGNOSIS — R058 Other specified cough: Secondary | ICD-10-CM | POA: Diagnosis not present

## 2024-08-14 DIAGNOSIS — N3000 Acute cystitis without hematuria: Secondary | ICD-10-CM | POA: Diagnosis not present

## 2024-08-14 DIAGNOSIS — R300 Dysuria: Secondary | ICD-10-CM | POA: Diagnosis not present

## 2024-08-14 DIAGNOSIS — Z794 Long term (current) use of insulin: Secondary | ICD-10-CM

## 2024-08-14 DIAGNOSIS — J029 Acute pharyngitis, unspecified: Secondary | ICD-10-CM | POA: Diagnosis not present

## 2024-08-14 LAB — POC COVID19 BINAXNOW: SARS Coronavirus 2 Ag: NEGATIVE

## 2024-08-14 LAB — POCT URINALYSIS DIP (CLINITEK)
Bilirubin, UA: NEGATIVE
Blood, UA: NEGATIVE
Glucose, UA: NEGATIVE mg/dL
Ketones, POC UA: NEGATIVE mg/dL
Nitrite, UA: NEGATIVE
POC PROTEIN,UA: 30 — AB
Spec Grav, UA: 1.025 (ref 1.010–1.025)
Urobilinogen, UA: 1 U/dL
pH, UA: 6 (ref 5.0–8.0)

## 2024-08-14 LAB — POCT INFLUENZA A/B
Influenza A, POC: NEGATIVE
Influenza B, POC: NEGATIVE

## 2024-08-14 LAB — POCT RAPID STREP A (OFFICE): Rapid Strep A Screen: POSITIVE — AB

## 2024-08-14 MED ORDER — AMOXICILLIN-POT CLAVULANATE 875-125 MG PO TABS: 1.0000 | ORAL_TABLET | Freq: Two times a day (BID) | ORAL | 0 refills | Status: AC

## 2024-08-14 NOTE — Telephone Encounter (Signed)
 Requested Prescriptions   Pending Prescriptions Disp Refills   Continuous Glucose Sensor (DEXCOM G7 SENSOR) MISC [Pharmacy Med Name: DEXCOM G7 SENSOR] 3 each 3    Sig: 1 DEVICE BY DOES NOT APPLY ROUTE AS DIRECTED. CHANGE SENSOR EVERY 10 DAYS

## 2024-08-14 NOTE — Progress Notes (Signed)
 Phone [phone] In person visit   Subjective:   [name] is a 63 y.o. year old very pleasant female patient who presents for/with See problem oriented charting Chief Complaint  Patient presents with   Urinary Tract Infection    Started last week; dysuria; frequency; lower abdominal pain;    Breathing Problem    Dry cough; lost voice last Thursday; ear pain since last Thursday; chest hurts when coughs; sinus headache since last Thursday;     Past Medical History-  Patient Active Problem List   Diagnosis Date Noted   Severe persistent asthma (HCC) 01/04/2019    Priority: High   Subcortical microvascular ischemic occlusive disease 07/12/2018    Priority: High   Chronic diastolic CHF (congestive heart failure) (HCC) 08/13/2017    Priority: High   Pulmonary sarcoidosis     Priority: High   Chronic bronchitis (HCC) in never smoker 09/18/2013    Priority: High   Chest pain 04/11/2013    Priority: High   Hypertensive heart disease with chronic diastolic congestive heart failure (HCC)     Priority: High   Sarcoidosis of lung 04/10/2007    Priority: High   Type II diabetes mellitus with renal manifestations (HCC) 08/21/2006    Priority: High   Iron  deficiency anemia 10/01/2020    Priority: Medium    Hypertriglyceridemia 04/02/2020    Priority: Medium    Aortic atherosclerosis 02/14/2020    Priority: Medium    Constipation 04/21/2018    Priority: Medium    Epiglottitis     Priority: Medium    Osteoarthritis of right knee 01/26/2017    Priority: Medium    Chronic kidney disease, stage 3b (HCC) 04/27/2015    Priority: Medium    History of osteomyelitis 03/27/2015    Priority: Medium    Fatty liver 09/30/2014    Priority: Medium    Major depression in full remission     Priority: Medium    Gout 08/20/2010    Priority: Medium    Deficiency anemia 09/18/2009    Priority: Medium    Sleep apnea 04/21/2009    Priority: Medium    Hyperlipidemia associated with  type 2 diabetes mellitus (HCC) 08/21/2006    Priority: Medium    Hypertension 08/21/2006    Priority: Medium    GERD 08/21/2006    Priority: Medium    Nontraumatic complete tear of left rotator cuff     Priority: Low   Diabetic neuropathy (HCC) 02/20/2019    Priority: Low   Rapid palpitations 07/12/2018    Priority: Low   Impingement of left ankle joint 06/26/2018    Priority: Low   Total knee replacement status, left 12/07/2017    Priority: Low   Oropharyngeal dysphagia 09/20/2017    Priority: Low   Plantar fasciitis, right 07/13/2017    Priority: Low   S/P total knee arthroplasty, right 06/29/2017    Priority: Low   Osteoarthrosis, localized, primary, knee, right     Priority: Low   Sprain of calcaneofibular ligament of right ankle 06/06/2017    Priority: Low   Onychomycosis 10/27/2015    Priority: Low   MRSA (methicillin resistant staph aureus) culture positive 03/27/2015    Priority: Low   Hot flashes 07/15/2014    Priority: Low   Abnormal SPEP 04/17/2014    Priority: Low   Fracture of left leg 04/17/2014    Priority: Low   Internal hemorrhoids     Priority: Low   Preoperative clearance 03/25/2014  Priority: Low   Solitary pulmonary nodule, on CT 02/2013 - stable over 2 years in 2015 02/20/2013    Priority: Low   Pain of left hand 04/19/2022    Priority: 1.   Facet arthropathy, cervical 06/29/2021    Priority: 1.   Foraminal stenosis of cervical region 06/29/2021    Priority: 1.   Impaired gait 04/05/2024   Decreased activities of daily living (ADL) 04/05/2024   Impaired functional mobility, balance, gait, and endurance 04/05/2024   Left carpal tunnel syndrome 03/24/2023   Arthritis of carpometacarpal [facility]) joint of left thumb 03/10/2023   Radial styloid tenosynovitis (de quervain) 03/10/2023   Trigger finger of left thumb 03/10/2023   Preop cardiovascular exam 12/07/2021   Subacromial bursitis of right shoulder joint 08/03/2021   Neck pain,  musculoskeletal 05/15/2021   Postural dizziness with presyncope 09/30/2020   Benign essential hypertension 08/07/2020   Obstructive chronic bronchitis with exacerbation (HCC) 09/18/2013    Medications- reviewed and updated Current Outpatient Medications  Medication Sig Dispense Refill   albuterol  (PROVENTIL ) (2.5 MG/3ML) 0.083% nebulizer solution Take 3 mLs (2.5 mg total) by nebulization every 6 (six) hours as needed for wheezing or shortness of breath (Use 2-3 times per day when sick.). 75 mL 2   albuterol  (VENTOLIN  HFA) 108 (90 Base) MCG/ACT inhaler Inhale 1-2 puffs into the lungs every 4 (four) hours as needed for wheezing or shortness of breath. 8.5 each 1   amoxicillin -clavulanate (AUGMENTIN ) 875-125 MG tablet Take 1 tablet by mouth 2 (two) times daily for 10 days. 20 tablet 0   aspirin  EC 81 MG tablet Take 1 tablet (81 mg total) by mouth daily. 30 tablet 11   atorvastatin  (LIPITOR) 40 MG tablet Take 1 tablet (40 mg total) by mouth daily. 90 tablet 3   benzonatate  (TESSALON  PERLES) 100 MG capsule Take 1-2 capsules (100-200 mg total) by mouth 3 (three) times daily as needed for cough. 30 capsule 3   buPROPion  (WELLBUTRIN  XL) 300 MG 24 hr tablet TAKE 1 TABLET BY MOUTH EVERY DAY 90 tablet 3   Calcium  Carbonate (CALTRATE 600 PO) Orally Once a day for 30 day(s)     chlorpheniramine -HYDROcodone  (TUSSIONEX) 10-8 MG/5ML Take 5 mLs by mouth at bedtime as needed for cough (do not drive for 8 hours after taking). 115 mL 0   Continuous Glucose Sensor (DEXCOM G7 SENSOR) MISC 1 DEVICE BY DOES NOT APPLY ROUTE AS DIRECTED. CHANGE SENSOR EVERY 10 DAYS 3 each 3   cyclobenzaprine  (FLEXERIL ) 5 MG tablet Take 1-2 tablets (5-10 mg total) by mouth 3 (three) times daily as needed for muscle spasms (May cause drowsiness). 30 tablet 0   diltiazem  (CARDIZEM  CD) 120 MG 24 hr capsule TAKE 1 CAPSULE BY MOUTH EVERY DAY 90 capsule 3   estradiol  (ESTRACE ) 0.1 MG/GM vaginal cream Place 0.5 Applicatorfuls vaginally daily as  needed (irritation).     famotidine  (PEPCID ) 20 MG tablet TAKE 1 TABLET BY MOUTH TWICE A DAY 180 tablet 1   fenofibrate  160 MG tablet TAKE 1 TABLET BY MOUTH EVERY DAY 90 tablet 2   fluticasone  (FLONASE ) 50 MCG/ACT nasal spray Place 2 sprays into both nostrils daily as needed for allergies. 48 mL 3   fluticasone  furoate-vilanterol (BREO ELLIPTA ) 200-25 MCG/ACT AEPB Inhale 1 puff into the lungs daily. 3 each 3   furosemide  (LASIX ) 40 MG tablet Take 1 tablet (40 mg total) by mouth daily. 90 tablet 3   gabapentin  (NEURONTIN ) 100 MG capsule TAKE 1 CAPSULE BY MOUTH THREE TIMES  A DAY 270 capsule 3   glucose blood (CONTOUR NEXT TEST) test strip 1 each by Other route 4 (four) times daily. And lancets 4/day 400 each 3   guaiFENesin -dextromethorphan  (ROBITUSSIN DM) 100-10 MG/5ML syrup Take 5 mLs by mouth every 4 (four) hours as needed for cough (chest congestion). 118 mL 0   Insulin  Pen Needle (PEN NEEDLES) 32G X 4 MM MISC 1 each by Does not apply route 4 (four) times daily. E11.9 400 each 0   ipratropium (ATROVENT ) 0.06 % nasal spray PLACE 2 SPRAYS INTO BOTH NOSTRILS 4 TIMES DAILY. 45 mL 3   KLOR-CON  M20 20 MEQ tablet TAKE 1 & 1/2 TABLETS BY MOUTH TWICE A DAY 270 tablet 2   Lancet Devices (EASY MINI EJECT LANCING DEVICE) MISC Use as directed 4 times daily to test blood sugar 1 each 3   LANTUS  SOLOSTAR 100 UNIT/ML Solostar Pen Inject 24 Units into the skin daily.     metFORMIN  (GLUCOPHAGE -XR) 500 MG 24 hr tablet Take 1 tablet (500 mg total) by mouth 2 (two) times daily with a meal. 180 tablet 0   Microlet Lancets MISC 1 each by Does not apply route 2 (two) times daily. E11.9 100 each 2   ondansetron  (ZOFRAN -ODT) 4 MG disintegrating tablet Take 1 tablet (4 mg total) by mouth every 8 (eight) hours as needed for nausea or vomiting. 20 tablet 0   pantoprazole  (PROTONIX ) 40 MG tablet TAKE 1 TABLET BY MOUTH TWICE A DAY 180 tablet 1   phenazopyridine  (PYRIDIUM ) 100 MG tablet Take 1 tablet (100 mg total) by mouth 3  (three) times daily as needed for pain. 10 tablet 0   telmisartan  (MICARDIS ) 40 MG tablet TAKE 1 TABLET BY MOUTH EVERY DAY 90 tablet 3   tirzepatide  (MOUNJARO ) 12.5 MG/0.5ML Pen Inject 12.5 mg into the skin once a week. 6 mL 0   traMADol  (ULTRAM ) 50 MG tablet Take 1 tablet (50 mg total) by mouth every 6 (six) hours as needed. 20 tablet 0   venlafaxine  XR (EFFEXOR -XR) 75 MG 24 hr capsule TAKE 1 CAPSULE BY MOUTH TWICE A DAY 180 capsule 3   No current facility-administered medications for this visit.     Objective:  BP 120/68 (BP Location: Left Arm, Patient Position: Sitting, Cuff Size: Normal)   Pulse 87   Temp (!) 97.5 F (36.4 C) (Temporal)   Ht 5' 6 (1.676 m)   Wt 181 lb 6.4 oz (82.3 kg)   SpO2 98%   BMI 29.28 kg/m  Gen: NAD, resting comfortably Tympanic membrane normal bilaterally, frontal sinus tenderness, nasal turbinates erythematous with yellow discharge, pharynx erythematous with mild tonsillar enlargement CV: RRR no murmurs rubs or gallops Lungs: CTAB no crackles, wheeze, rhonchi Abdomen: soft/nontender/nondistended/normal bowel sounds. No rebound or guarding.  No CVA tenderness Ext: no edema Skin: warm, dry  Results for orders placed or performed in visit on 08/14/24 (from the past 24 hours)  POCT URINALYSIS DIP (CLINITEK)     Status: Abnormal   Collection Time: 08/14/24  2:26 PM  Result Value Ref Range   Color, UA straw (A) yellow   Clarity, UA cloudy (A) clear   Glucose, UA negative negative mg/dL   Bilirubin, UA negative negative   Ketones, POC UA negative negative mg/dL   Spec Grav, UA 8.974 8.989 - 1.025   Blood, UA negative negative   pH, UA 6.0 5.0 - 8.0   POC PROTEIN,UA =30 (A) negative, trace   Urobilinogen, UA 1.0 0.2 or 1.0 E.U./dL  Nitrite, UA Negative Negative   Leukocytes, UA Small (1+) (A) Negative  POCT rapid strep A     Status: Abnormal   Collection Time: 08/14/24  2:39 PM  Result Value Ref Range   Rapid Strep A Screen Positive (A) Negative   POCT Influenza A/B     Status: None   Collection Time: 08/14/24  2:40 PM  Result Value Ref Range   Influenza A, POC Negative Negative   Influenza B, POC Negative Negative  POC COVID-19     Status: None   Collection Time: 08/14/24  2:41 PM  Result Value Ref Range   SARS Coronavirus 2 Ag Negative Negative   *Note: Due to a large number of results and/or encounters for the requested time period, some results have not been displayed. A complete set of results can be found in Results Review.       Assessment and Plan   #Concern for UTI S: Patients symptoms started about a week ago.  Complains of dysuria: Yes and has lower abdominal pain as well; polyuria: yes; nocturia: yes; urgency: yes.  Symptoms are stable.  ROS- no fever, chills, nausea, vomiting, flank pain. No blood in urine.   A/P: UA collected-concern for UTI. Will get culture. Empiric treatment with: Augmentin  to also cover sinuses as well as strep throat Patient to follow up if new or worsening symptoms or failure to improve.   # Sore throat/dry cough/ sinus headcches S: Patient reports dry cough and sore throat development last Thursday the 30th and on day 6 today.  She has noted a sinus headache since that time as well.  Also getting some chest discomfort when she coughs. No shortness of breath. No wheeze  A/P: Patients strep test was positive today and with her sinus headache(s) and urinary symptom(s) we are going to treat with Augmentin  for 10 days to cover urinary tract infection hopefully as well as possible early bacterial sinusitis (though could be viral) as well as strep throat      # hypertension/hypertensive heart disease with chronic diastolic CHF/CKD stage III S: compliant with Lasix  40 mg daily (also potassium 20 meq), diltiazem  extended release 120 mg,  telmisartan  40 mg  - GFR at 35 for CKD III should be stable for augmentin  A/P: well controlled continue current medications for blood pressure  For CKD III-  stable- avoids NSAIDs- level stable for urinary tract infection/strep treatment   Recommended follow up: Return for as needed for new, worsening, persistent symptoms. Future Appointments  Date Time Provider Department Center  08/16/2024 10:15 AM [facility] None  08/21/2024  9:20 AM Dartha Ernst, MD [facility] None  08/22/2024  9:30 AM Parrett, Madelin RAMAN, NP [facility] [id number]  01/08/2025  1:00 PM Katrinka Garnette KIDD, MD [facility]  04/09/2025  1:00 PM [facility] ANNUAL WELLNESS VISIT 1 [facility] [place]    Lab/Order associations:   ICD-10-CM   1. Dysuria  R30.0 POCT URINALYSIS DIP (CLINITEK)    Urine Culture    Urine Culture    2. Strep pharyngitis  J02.0     3. Acute cystitis without hematuria  N30.00     4. Benign essential hypertension  I10     5. Stage 3b chronic kidney disease (HCC)  N18.32     6. Other cough  R05.8 POCT Influenza A/B    POC COVID-19    POCT rapid strep A    7. Sore throat  J02.9 POCT Influenza A/B  POC COVID-19    POCT rapid strep A      Meds ordered this encounter  Medications   amoxicillin -clavulanate (AUGMENTIN ) 875-125 MG tablet    Sig: Take 1 tablet by mouth 2 (two) times daily for 10 days.    Dispense:  20 tablet    Refill:  0    Return precautions advised.  Garnette Lukes, MD

## 2024-08-14 NOTE — Patient Instructions (Addendum)
 Treat with Augmentin  x 10 days- hoping this covers urinary tract infection (await culture) but will cover strep and should cover sinuses although early to call bacterial  Recommended follow up: Return for as needed for new, worsening, persistent symptoms.

## 2024-08-16 ENCOUNTER — Other Ambulatory Visit

## 2024-08-16 LAB — BASIC METABOLIC PANEL WITH GFR
BUN/Creatinine Ratio: 15 (calc) (ref 6–22)
BUN: 19 mg/dL (ref 7–25)
CO2: 29 mmol/L (ref 20–32)
Calcium: 9.7 mg/dL (ref 8.6–10.4)
Chloride: 104 mmol/L (ref 98–110)
Creat: 1.3 mg/dL — ABNORMAL HIGH (ref 0.50–1.05)
Glucose, Bld: 125 mg/dL — ABNORMAL HIGH (ref 65–99)
Potassium: 4.8 mmol/L (ref 3.5–5.3)
Sodium: 143 mmol/L (ref 135–146)
eGFR: 46 mL/min/1.73m2 — ABNORMAL LOW (ref >=60)

## 2024-08-17 ENCOUNTER — Ambulatory Visit: Payer: Self-pay | Admitting: Family Medicine

## 2024-08-17 LAB — URINE CULTURE
MICRO NUMBER:: 17188502
SPECIMEN QUALITY:: ADEQUATE

## 2024-08-17 MED ORDER — SULFAMETHOXAZOLE-TRIMETHOPRIM 800-160 MG PO TABS: 1.0000 | ORAL_TABLET | Freq: Two times a day (BID) | ORAL | 0 refills | Status: AC

## 2024-08-18 ENCOUNTER — Emergency Department (HOSPITAL_BASED_OUTPATIENT_CLINIC_OR_DEPARTMENT_OTHER)
Admission: EM | Admit: 2024-08-18 | Discharge: 2024-08-19 | Disposition: A | Discharge status: Home / Self Care | Attending: Emergency Medicine | Admitting: Emergency Medicine

## 2024-08-18 ENCOUNTER — Other Ambulatory Visit: Payer: Self-pay

## 2024-08-18 ENCOUNTER — Emergency Department (HOSPITAL_BASED_OUTPATIENT_CLINIC_OR_DEPARTMENT_OTHER): Admitting: Radiology

## 2024-08-18 DIAGNOSIS — Z794 Long term (current) use of insulin: Secondary | ICD-10-CM | POA: Insufficient documentation

## 2024-08-18 DIAGNOSIS — E119 Type 2 diabetes mellitus without complications: Secondary | ICD-10-CM | POA: Insufficient documentation

## 2024-08-18 DIAGNOSIS — Z7984 Long term (current) use of oral hypoglycemic drugs: Secondary | ICD-10-CM | POA: Insufficient documentation

## 2024-08-18 DIAGNOSIS — M79642 Pain in left hand: Secondary | ICD-10-CM | POA: Insufficient documentation

## 2024-08-18 DIAGNOSIS — Z7982 Long term (current) use of aspirin: Secondary | ICD-10-CM | POA: Diagnosis not present

## 2024-08-18 DIAGNOSIS — W19XXXA Unspecified fall, initial encounter: Secondary | ICD-10-CM | POA: Insufficient documentation

## 2024-08-18 DIAGNOSIS — S63502A Unspecified sprain of left wrist, initial encounter: Secondary | ICD-10-CM

## 2024-08-18 DIAGNOSIS — I10 Essential (primary) hypertension: Secondary | ICD-10-CM | POA: Diagnosis not present

## 2024-08-18 DIAGNOSIS — M25532 Pain in left wrist: Secondary | ICD-10-CM | POA: Diagnosis not present

## 2024-08-18 DIAGNOSIS — M25552 Pain in left hip: Secondary | ICD-10-CM | POA: Insufficient documentation

## 2024-08-18 DIAGNOSIS — Z79899 Other long term (current) drug therapy: Secondary | ICD-10-CM | POA: Insufficient documentation

## 2024-08-18 DIAGNOSIS — S7002XA Contusion of left hip, initial encounter: Secondary | ICD-10-CM

## 2024-08-18 LAB — CBC WITH DIFFERENTIAL/PLATELET
Abs Immature Granulocytes: 0.18 K/uL — ABNORMAL HIGH (ref 0.00–0.07)
Basophils Absolute: 0.1 K/uL (ref 0.0–0.1)
Basophils Relative: 1 %
Eosinophils Absolute: 0.5 K/uL (ref 0.0–0.5)
Eosinophils Relative: 3 %
HCT: 29.4 % — ABNORMAL LOW (ref 36.0–46.0)
Hemoglobin: 9.8 g/dL — ABNORMAL LOW (ref 12.0–15.0)
Immature Granulocytes: 1 %
Lymphocytes Relative: 27 %
Lymphs Abs: 4 K/uL (ref 0.7–4.0)
MCH: 30.1 pg (ref 26.0–34.0)
MCHC: 33.3 g/dL (ref 30.0–36.0)
MCV: 90.2 fL (ref 80.0–100.0)
Monocytes Absolute: 1 K/uL (ref 0.1–1.0)
Monocytes Relative: 7 %
Neutro Abs: 9.1 K/uL — ABNORMAL HIGH (ref 1.7–7.7)
Neutrophils Relative %: 61 %
Platelets: 362 K/uL (ref 150–400)
RBC: 3.26 MIL/uL — ABNORMAL LOW (ref 3.87–5.11)
RDW: 13.6 % (ref 11.5–15.5)
WBC: 14.9 K/uL — ABNORMAL HIGH (ref 4.0–10.5)
nRBC: 0 % (ref 0.0–0.2)

## 2024-08-18 NOTE — ED Triage Notes (Addendum)
 Pt POV reporting fall this afternoon when getting out of car, became dizzy and fell on L side, reporting L arm and L hip pain, no thinners, denies dizziness at this time

## 2024-08-18 NOTE — ED Provider Notes (Signed)
 [place] EM[facility]GENC[facility] DEPARTMENT AT [facility] Provider Note   CSN: [id number] Arrival date & time: 08/18/24  2231     Patient presents with: [name] is a 63 y.o. female.  {Add pertinent medical, surgical, social history, OB history to HPI:32947} Patient is a 63 year old female with past medical history of diabetes, hypertension, hyperlipidemia.  Patient presenting today for evaluation of a fall.  She reports getting out of the car to go into a restaurant when she became somewhat dizzy, then fell.  She landed on her left hip and left wrist.  She is describing discomfort to the left hip, left wrist, and left hand.  She denies any loss of consciousness.  The dizziness has resolved.  She does report being treated recently for bronchitis and does report some ear congestion.       Prior to Admission medications   Medication Sig Start Date End Date Taking? Authorizing Provider  albuterol  (PROVENTIL ) (2.5 MG/3ML) 0.083% nebulizer solution Take 3 mLs (2.5 mg total) by nebulization every 6 (six) hours as needed for wheezing or shortness of breath (Use 2-3 times per day when sick.). 07/04/24   Lucius Krabbe, NP  albuterol  (VENTOLIN  HFA) 108 (90 Base) MCG/ACT inhaler Inhale 1-2 puffs into the lungs every 4 (four) hours as needed for wheezing or shortness of breath. 01/02/24   Job Lukes, PA  amoxicillin -clavulanate (AUGMENTIN ) 875-125 MG tablet Take 1 tablet by mouth 2 (two) times daily for 10 days. 08/14/24 08/24/24  Katrinka Garnette KIDD, MD  aspirin  EC 81 MG tablet Take 1 tablet (81 mg total) by mouth daily. 11/03/20   Anner Alm ORN, MD  atorvastatin  (LIPITOR) 40 MG tablet Take 1 tablet (40 mg total) by mouth daily. 09/19/23   Katrinka Garnette KIDD, MD  benzonatate  (TESSALON  PERLES) 100 MG capsule Take 1-2 capsules (100-200 mg total) by mouth 3 (three) times daily as needed for cough. 07/09/24   Katrinka Garnette KIDD, MD  buPROPion  (WELLBUTRIN  XL) 300 MG 24 hr tablet TAKE 1  TABLET BY MOUTH EVERY DAY 01/11/24   Katrinka Garnette KIDD, MD  Calcium  Carbonate (CALTRATE 600 PO) Orally Once a day for 30 day(s)    [provider]  chlorpheniramine -HYDROcodone  (TUSSIONEX) 10-8 MG/5ML Take 5 mLs by mouth at bedtime as needed for cough (do not drive for 8 hours after taking). 07/09/24   Katrinka Garnette KIDD, MD  Continuous Glucose Sensor (DEXCOM G7 SENSOR) MISC 1 DEVICE BY DOES NOT APPLY ROUTE AS DIRECTED. CHANGE SENSOR EVERY 10 DAYS 08/14/24   Dartha Ernst, MD  cyclobenzaprine  (FLEXERIL ) 5 MG tablet Take 1-2 tablets (5-10 mg total) by mouth 3 (three) times daily as needed for muscle spasms (May cause drowsiness). 04/16/24   Lucius Krabbe, NP  diltiazem  (CARDIZEM  CD) 120 MG 24 hr capsule TAKE 1 CAPSULE BY MOUTH EVERY DAY 03/21/24   Goodrich, Callie E, PA-C  estradiol  (ESTRACE ) 0.1 MG/GM vaginal cream Place 0.5 Applicatorfuls vaginally daily as needed (irritation).    [provider]  famotidine  (PEPCID ) 20 MG tablet TAKE 1 TABLET BY MOUTH TWICE A DAY 02/08/24   Katrinka Garnette KIDD, MD  fenofibrate  160 MG tablet TAKE 1 TABLET BY MOUTH EVERY DAY 06/14/24   Anner Alm ORN, MD  fluticasone  (FLONASE ) 50 MCG/ACT nasal spray Place 2 sprays into both nostrils daily as needed for allergies. 08/11/23   Von Bellis, MD  fluticasone  furoate-vilanterol (BREO ELLIPTA ) 200-25 MCG/ACT AEPB Inhale 1 puff into the lungs daily. 07/09/24   Katrinka Garnette  O, MD  furosemide  (LASIX ) 40 MG tablet Take 1 tablet (40 mg total) by mouth daily. 02/20/24   Katrinka Garnette KIDD, MD  gabapentin  (NEURONTIN ) 100 MG capsule TAKE 1 CAPSULE BY MOUTH THREE TIMES A DAY 09/06/23   Katrinka Garnette KIDD, MD  glucose blood (CONTOUR NEXT TEST) test strip 1 each by Other route 4 (four) times daily. And lancets 4/day 09/19/19   Kassie Mallick, MD  guaiFENesin -dextromethorphan  (ROBITUSSIN DM) 100-10 MG/5ML syrup Take 5 mLs by mouth every 4 (four) hours as needed for cough (chest congestion). 08/11/23   Von Bellis, MD   Insulin  Pen Needle (PEN NEEDLES) 32G X 4 MM MISC 1 each by Does not apply route 4 (four) times daily. E11.9 02/22/20   Kassie Mallick, MD  ipratropium (ATROVENT ) 0.06 % nasal spray PLACE 2 SPRAYS INTO BOTH NOSTRILS 4 TIMES DAILY. 05/11/23   Katrinka Garnette KIDD, MD  KLOR-CON  M20 20 MEQ tablet TAKE 1 & 1/2 TABLETS BY MOUTH TWICE A DAY 10/14/23   Katrinka Garnette KIDD, MD  Lancet Devices (EASY MINI EJECT LANCING DEVICE) MISC Use as directed 4 times daily to test blood sugar 11/04/20   Kassie Mallick, MD  LANTUS  SOLOSTAR 100 UNIT/ML Solostar Pen Inject 24 Units into the skin daily.    [provider]  metFORMIN  (GLUCOPHAGE -XR) 500 MG 24 hr tablet Take 1 tablet (500 mg total) by mouth 2 (two) times daily with a meal. 05/21/24 08/19/24  Motwani, Obadiah, MD  Microlet Lancets MISC 1 each by Does not apply route 2 (two) times daily. E11.9 10/03/23   Motwani, Komal, MD  ondansetron  (ZOFRAN -ODT) 4 MG disintegrating tablet Take 1 tablet (4 mg total) by mouth every 8 (eight) hours as needed for nausea or vomiting. 03/21/24   Job Lukes, PA  pantoprazole  (PROTONIX ) 40 MG tablet TAKE 1 TABLET BY MOUTH TWICE A DAY 04/06/24   Abran Norleen SAILOR, MD  phenazopyridine  (PYRIDIUM ) 100 MG tablet Take 1 tablet (100 mg total) by mouth 3 (three) times daily as needed for pain. 06/01/24   Lucius Krabbe, NP  sulfamethoxazole -trimethoprim  (BACTRIM  DS) 800-160 MG tablet Take 1 tablet by mouth 2 (two) times daily for 7 days. 08/17/24 08/24/24  Katrinka Garnette KIDD, MD  telmisartan  (MICARDIS ) 40 MG tablet TAKE 1 TABLET BY MOUTH EVERY DAY 12/19/23   Katrinka Garnette KIDD, MD  tirzepatide  (MOUNJARO ) 12.5 MG/0.5ML Pen Inject 12.5 mg into the skin once a week. 05/21/24   Motwani, Komal, MD  traMADol  (ULTRAM ) 50 MG tablet Take 1 tablet (50 mg total) by mouth every 6 (six) hours as needed. 03/26/24 03/26/25  Rubin Calamity, MD  venlafaxine  XR (EFFEXOR -XR) 75 MG 24 hr capsule TAKE 1 CAPSULE BY MOUTH TWICE A DAY 04/10/24   Katrinka Garnette KIDD, MD   mupirocin  nasal ointment (BACTROBAN ) 2 % Place 1 application into the nose 2 (two) times daily. Use one-half of tube in each nostril twice daily for five (5) days. After application, press sides of nose together and gently massage. 03/28/18 03/28/18  Katrinka Garnette KIDD, MD    Allergies: Firvanq  [vancomycin ], Methotrexate, Lisinopril , Chlorhexidine , Cleocin  [clindamycin ], Lincocin [lincomycin hcl], and Teflaro [ceftaroline]    Review of Systems  All other systems reviewed and are negative.   Updated Vital Signs BP (!) 132/49   Pulse 79   Temp 98 F (36.7 C) (Temporal)   Resp (!) 23   Ht 5' 6 (1.676 m)   Wt 80.3 kg   SpO2 98%   BMI 28.57 kg/m   Physical Exam Vitals and  nursing note reviewed.  Constitutional:      General: She is not in acute distress.    Appearance: She is well-developed. She is not diaphoretic.  HENT:     Head: Normocephalic and atraumatic.  Cardiovascular:     Rate and Rhythm: Normal rate and regular rhythm.     Heart sounds: No murmur heard.    No friction rub. No gallop.  Pulmonary:     Effort: Pulmonary effort is normal. No respiratory distress.     Breath sounds: Normal breath sounds. No wheezing.  Abdominal:     General: Bowel sounds are normal. There is no distension.     Palpations: Abdomen is soft.     Tenderness: There is no abdominal tenderness.  Musculoskeletal:        General: Normal range of motion.     Cervical back: Normal range of motion and neck supple.     Comments: There is tenderness to palpation over the lateral aspect of the left hip.  There is no deformity or shortening or rotation of the leg.  DP pulses are palpable and motor and sensation are intact throughout the entire leg.  The left hand and wrist appear grossly normal without significant swelling or deformity.  She has good range of motion of the wrist.  Ulnar and radial pulses are palpable and capillary refill is brisk to all fingers.  She is able to flex, extend all fingers.   Sensation is intact to all fingers.  Skin:    General: Skin is warm and dry.  Neurological:     General: No focal deficit present.     Mental Status: She is alert and oriented to person, place, and time.     (all labs ordered are listed, but only abnormal results are displayed) Labs Reviewed  BASIC METABOLIC PANEL WITH GFR  CBC WITH DIFFERENTIAL/PLATELET    EKG: None  Radiology: No results found.  {Document cardiac monitor, telemetry assessment procedure when appropriate:32947} Procedures   Medications Ordered in the ED - No data to display    {Click here for ABCD2, HEART and other calculators REFRESH Note before signing:1}                              Medical Decision Making Amount and/or Complexity of Data Reviewed Labs: ordered. Radiology: ordered.   ***  {Document critical care time when appropriate  Document review of labs and clinical decision tools ie [id number], etc  Document your independent review of radiology images and any outside records  Document your discussion with family members, caretakers and with consultants  Document social determinants of health affecting pt's care  Document your decision making why or why not admission, treatments were needed:32947:::1}   Final diagnoses:  None    ED Discharge Orders     None

## 2024-08-19 ENCOUNTER — Other Ambulatory Visit: Payer: Self-pay | Admitting: Family Medicine

## 2024-08-19 LAB — BASIC METABOLIC PANEL WITH GFR
Anion gap: 12 (ref 5–15)
BUN: 23 mg/dL (ref 8–23)
CO2: 26 mmol/L (ref 22–32)
Calcium: 9.5 mg/dL (ref 8.9–10.3)
Chloride: 101 mmol/L (ref 98–111)
Creatinine, Ser: 1.67 mg/dL — ABNORMAL HIGH (ref 0.44–1.00)
GFR, Estimated: 34 mL/min — ABNORMAL LOW (ref >60)
Glucose, Bld: 104 mg/dL — ABNORMAL HIGH (ref 70–99)
Potassium: 4.1 mmol/L (ref 3.5–5.1)
Sodium: 140 mmol/L (ref 135–145)

## 2024-08-19 NOTE — Discharge Instructions (Signed)
Ice for 20 minutes every 2 hours while awake for the next 2 days.  Rest.  Take ibuprofen 600 mg every 6 hours as needed for pain.  Follow-up with primary doctor if not improving in the next week.

## 2024-08-21 ENCOUNTER — Encounter: Payer: Self-pay | Admitting: "Endocrinology

## 2024-08-21 ENCOUNTER — Ambulatory Visit (INDEPENDENT_AMBULATORY_CARE_PROVIDER_SITE_OTHER): Admitting: "Endocrinology

## 2024-08-21 VITALS — BP 102/60 | Ht 66.0 in | Wt 181.0 lb

## 2024-08-21 DIAGNOSIS — Z794 Long term (current) use of insulin: Secondary | ICD-10-CM

## 2024-08-21 DIAGNOSIS — E782 Mixed hyperlipidemia: Secondary | ICD-10-CM

## 2024-08-21 DIAGNOSIS — Z7985 Long-term (current) use of injectable non-insulin antidiabetic drugs: Secondary | ICD-10-CM

## 2024-08-21 DIAGNOSIS — Z7984 Long term (current) use of oral hypoglycemic drugs: Secondary | ICD-10-CM

## 2024-08-21 DIAGNOSIS — E1140 Type 2 diabetes mellitus with diabetic neuropathy, unspecified: Secondary | ICD-10-CM

## 2024-08-21 LAB — POCT GLYCOSYLATED HEMOGLOBIN (HGB A1C): Hemoglobin A1C: 5.9 % — AB (ref 4.0–5.6)

## 2024-08-21 MED ORDER — TIRZEPATIDE 15 MG/0.5ML ~~LOC~~ SOAJ: 15.0000 mg | SUBCUTANEOUS | 3 refills | Status: DC

## 2024-08-21 MED ORDER — TIRZEPATIDE 15 MG/0.5ML ~~LOC~~ SOAJ
15.0000 mg | SUBCUTANEOUS | 3 refills | Status: AC
Start: 2024-08-21 — End: ?

## 2024-08-21 NOTE — Progress Notes (Signed)
 Outpatient Endocrinology Note Obadiah Birmingham, MD  08/21/24   [name] April 28, 1961 [id number]  Referring Provider: Katrinka Garnette KIDD, MD Primary Care Provider: Katrinka Garnette KIDD, MD Reason for consultation: Subjective   Assessment & Plan  Diagnoses and all orders for this visit:  Type 2 diabetes mellitus with diabetic neuropathy, with long-term current use of insulin  (HCC) -     POCT glycosylated hemoglobin (Hb A1C) -     Discontinue: tirzepatide  (MOUNJARO ) 15 MG/0.5ML Pen; Inject 15 mg into the skin once a week. -     Microalbumin / creatinine urine ratio -     Lipid panel -     Comprehensive metabolic panel with GFR -     tirzepatide  (MOUNJARO ) 15 MG/0.5ML Pen; Inject 15 mg into the skin once a week.  Long term (current) use of oral hypoglycemic drugs  Long-term (current) use of injectable non-insulin  antidiabetic drugs  Long-term insulin  use (HCC)  Insulin  dose changed (HCC)  Mixed hypercholesterolemia and hypertriglyceridemia    Diabetes complicated by nephropathy Hba1c goal less than 7.0, current Hba1c is  Lab Results  Component Value Date   HGBA1C 5.9 (A) 08/21/2024   HGBA1C 6.4 (H) 03/14/2024   HGBA1C 6.9 (A) 01/18/2024    Will recommend the following: Metformin  XR 500 mg 2 pills a day (stop if GFR <30, patient and husband educated), mounjaro  15 mg/week Lantus  18 units at am, cut by 2 units if blood sugar is less than 100 BMP Q3 mo to check GFR (pt knows to cut metformin  to 1000 mg every day if GFR drops <45 and stop ti if GFR <30)  12/2023 Stopped premeal NovoLog  07-22-13 fifteen min before meals due to HYPOGLYCEMIA, as low as 40s Stopped Trulicity  4.5 mg weekly due to poor weight loss Got sick with ozempic   No known contraindications to any of above medications  Hyperlipidemia -Last LDL at goal: 56, Tg 154 -on atorvastatin  40 mg QD and fenofibrate  160 mg qd -Follow low fat diet and exercise   -Blood pressure goal <140/90 -  Microalbumin/creatinine at goal < 30 - on ACE/ARB Telmisartan  40 mg qd -diet changes including salt restriction -limit eating outside -counseled BP targets per standards of diabetes care -Uncontrolled blood pressure can lead to retinopathy, nephropathy and cardiovascular and atherosclerotic heart disease  Reviewed and counseled on: -A1C target -Blood sugar targets -Complications of uncontrolled diabetes  -Checking blood sugar before meals and bedtime and bring log next visit -All medications with mechanism of action and side effects -Hypoglycemia management: rule of 15's, Glucagon Emergency Kit and medical alert ID -low-carb low-fat plate-method diet -At least 20 minutes of physical activity per day -Annual dilated retinal eye exam and foot exam -compliance and follow up needs -follow up as scheduled or earlier if problem gets worse  Call if blood sugar is less than 70 or consistently above 250    Take a 15 gm snack of carbohydrate at bedtime before you go to sleep if your blood sugar is less than 100.    If you are going to fast after midnight for a test or procedure, ask your physician for instructions on how to reduce/decrease your insulin  dose.    Call if blood sugar is less than 70 or consistently above 250  -Treating a low sugar by rule of 15  (15 gms of sugar every 15 min until sugar is more than 70) If you feel your sugar is low, test your sugar to be sure If your sugar  is low (less than 70), then take 15 grams of a fast acting Carbohydrate (3-4 glucose tablets or glucose gel or 4 ounces of juice or regular soda) Recheck your sugar 15 min after treating low to make sure it is more than 70 If sugar is still less than 70, treat again with 15 grams of carbohydrate          Don't drive the hour of hypoglycemia  If unconscious/unable to eat or drink by mouth, use glucagon injection or nasal spray baqsimi and call 911. Can repeat again in 15 min if still unconscious.  Return in  about 3 months (around 11/21/2024) for visit and 8 am labs before next visit.   I have reviewed current medications, nurse's notes, allergies, vital signs, past medical and surgical history, family medical history, and social history for this encounter. Counseled patient on symptoms, examination findings, lab findings, imaging results, treatment decisions and monitoring and prognosis. The patient understood the recommendations and agrees with the treatment plan. All questions regarding treatment plan were fully answered.  Obadiah Birmingham, MD  08/21/24   History of Present Illness [name] is a 63 y.o. year old female who presents for evaluation of Type 2 diabetes mellitus.  [name] was first diagnosed in 2007.    Home diabetes regimen: Metformin  XR 500 mg a day, Lantus  20 units at am, Mounjaro  12.5 mg/week    Stopped Premeal NovoLog  07-22-13 units tidac   Stopped Trulicity  4.5 mg weekly, likes mounjaro  due to weight loss   Previous history:  Non-insulin  hypoglycemic drugs previously used: Ozempic , metformin  Insulin  was started a few years after diagnosis Side effects from medications: Ozempic : nausea  COMPLICATIONS -  MI/Stroke -  retinopathy +  neuropathy-sees podiatrist annually Dr Janit  +  nephropathy  BLOOD SUGAR DATA CGM interpretation: At today's visit, we reviewed her CGM downloads. The full report is scanned in the media. Reviewing the CGM trends, blood glucose is mildly elevated at noon, otherwise mostly well controlled.  Physical Exam  BP 102/60   Ht 5' 6 (1.676 m)   Wt 181 lb (82.1 kg)   BMI 29.21 kg/m    Constitutional: well developed, well nourished Head: normocephalic, atraumatic Eyes: sclera anicteric, no redness Neck: supple Lungs: normal respiratory effort Neurology: alert and oriented Skin: dry, no appreciable rashes Musculoskeletal: no appreciable defects Psychiatric: normal mood and affect   Current Medications Patient's  Medications  New Prescriptions   TIRZEPATIDE  (MOUNJARO ) 15 MG/0.5ML PEN    Inject 15 mg into the skin once a week.  Previous Medications   ALBUTEROL  (PROVENTIL ) (2.5 MG/3ML) 0.083% NEBULIZER SOLUTION    Take 3 mLs (2.5 mg total) by nebulization every 6 (six) hours as needed for wheezing or shortness of breath (Use 2-3 times per day when sick.).   ALBUTEROL  (VENTOLIN  HFA) 108 (90 BASE) MCG/ACT INHALER    Inhale 1-2 puffs into the lungs every 4 (four) hours as needed for wheezing or shortness of breath.   AMOXICILLIN -CLAVULANATE (AUGMENTIN ) 875-125 MG TABLET    Take 1 tablet by mouth 2 (two) times daily for 10 days.   ASPIRIN  EC 81 MG TABLET    Take 1 tablet (81 mg total) by mouth daily.   ATORVASTATIN  (LIPITOR) 40 MG TABLET    TAKE 1 TABLET BY MOUTH EVERY DAY   BENZONATATE  (TESSALON  PERLES) 100 MG CAPSULE    Take 1-2 capsules (100-200 mg total) by mouth 3 (three) times daily as needed for cough.   BUPROPION  (WELLBUTRIN   XL) 300 MG 24 HR TABLET    TAKE 1 TABLET BY MOUTH EVERY DAY   CALCIUM  CARBONATE (CALTRATE 600 PO)    Orally Once a day for 30 day(s)   CHLORPHENIRAMINE -HYDROCODONE  (TUSSIONEX) 10-8 MG/5ML    Take 5 mLs by mouth at bedtime as needed for cough (do not drive for 8 hours after taking).   CONTINUOUS GLUCOSE SENSOR (DEXCOM G7 SENSOR) MISC    1 DEVICE BY DOES NOT APPLY ROUTE AS DIRECTED. CHANGE SENSOR EVERY 10 DAYS   CYCLOBENZAPRINE  (FLEXERIL ) 5 MG TABLET    Take 1-2 tablets (5-10 mg total) by mouth 3 (three) times daily as needed for muscle spasms (May cause drowsiness).   DILTIAZEM  (CARDIZEM  CD) 120 MG 24 HR CAPSULE    TAKE 1 CAPSULE BY MOUTH EVERY DAY   ESTRADIOL  (ESTRACE ) 0.1 MG/GM VAGINAL CREAM    Place 0.5 Applicatorfuls vaginally daily as needed (irritation).   FAMOTIDINE  (PEPCID ) 20 MG TABLET    TAKE 1 TABLET BY MOUTH TWICE A DAY   FENOFIBRATE  160 MG TABLET    TAKE 1 TABLET BY MOUTH EVERY DAY   FLUTICASONE  (FLONASE ) 50 MCG/ACT NASAL SPRAY    Place 2 sprays into both nostrils daily  as needed for allergies.   FLUTICASONE  FUROATE-VILANTEROL (BREO ELLIPTA ) 200-25 MCG/ACT AEPB    Inhale 1 puff into the lungs daily.   FUROSEMIDE  (LASIX ) 40 MG TABLET    Take 1 tablet (40 mg total) by mouth daily.   GABAPENTIN  (NEURONTIN ) 100 MG CAPSULE    TAKE 1 CAPSULE BY MOUTH THREE TIMES A DAY   GLUCOSE BLOOD (CONTOUR NEXT TEST) TEST STRIP    1 each by Other route 4 (four) times daily. And lancets 4/day   GUAIFENESIN -DEXTROMETHORPHAN  (ROBITUSSIN DM) 100-10 MG/5ML SYRUP    Take 5 mLs by mouth every 4 (four) hours as needed for cough (chest congestion).   INSULIN  PEN NEEDLE (PEN NEEDLES) 32G X 4 MM MISC    1 each by Does not apply route 4 (four) times daily. E11.9   IPRATROPIUM (ATROVENT ) 0.06 % NASAL SPRAY    PLACE 2 SPRAYS INTO BOTH NOSTRILS 4 TIMES DAILY.   KLOR-CON  M20 20 MEQ TABLET    TAKE 1 & 1/2 TABLETS BY MOUTH TWICE A DAY   LANCET DEVICES (EASY MINI EJECT LANCING DEVICE) MISC    Use as directed 4 times daily to test blood sugar   LANTUS  SOLOSTAR 100 UNIT/ML SOLOSTAR PEN    Inject 24 Units into the skin daily.   METFORMIN  (GLUCOPHAGE -XR) 500 MG 24 HR TABLET    Take 1 tablet (500 mg total) by mouth 2 (two) times daily with a meal.   MICROLET LANCETS MISC    1 each by Does not apply route 2 (two) times daily. E11.9   ONDANSETRON  (ZOFRAN -ODT) 4 MG DISINTEGRATING TABLET    Take 1 tablet (4 mg total) by mouth every 8 (eight) hours as needed for nausea or vomiting.   PANTOPRAZOLE  (PROTONIX ) 40 MG TABLET    TAKE 1 TABLET BY MOUTH TWICE A DAY   PHENAZOPYRIDINE  (PYRIDIUM ) 100 MG TABLET    Take 1 tablet (100 mg total) by mouth 3 (three) times daily as needed for pain.   SULFAMETHOXAZOLE -TRIMETHOPRIM  (BACTRIM  DS) 800-160 MG TABLET    Take 1 tablet by mouth 2 (two) times daily for 7 days.   TELMISARTAN  (MICARDIS ) 40 MG TABLET    TAKE 1 TABLET BY MOUTH EVERY DAY   TRAMADOL  (ULTRAM ) 50 MG TABLET    Take 1  tablet (50 mg total) by mouth every 6 (six) hours as needed.   VENLAFAXINE  XR (EFFEXOR -XR) 75 MG  24 HR CAPSULE    TAKE 1 CAPSULE BY MOUTH TWICE A DAY  Modified Medications   No medications on file  Discontinued Medications   TIRZEPATIDE  (MOUNJARO ) 12.5 MG/0.5ML PEN    Inject 12.5 mg into the skin once a week.    Allergies Allergies  Allergen Reactions   Firvanq  [Vancomycin ] Other (See Comments)    Dose related nephrotoxicity    Methotrexate Other (See Comments)    Peri-oral and buccal lesions   Lisinopril  Cough   Chlorhexidine  Itching   Cleocin  [Clindamycin ] Nausea And Vomiting and Rash   Lincocin [Lincomycin Hcl] Nausea And Vomiting and Rash   Teflaro [Ceftaroline] Rash and Other (See Comments)    Tolerates ceftriaxone      Past Medical History Past Medical History:  Diagnosis Date   Abnormal SPEP 04/17/2014   Acute left ankle pain 01/26/2017   Allergy    ANEMIA-UNSPECIFIED 09/18/2009   Anxiety    Arthritis    Blood transfusion without reported diagnosis    Cataract    CHF (congestive heart failure) (HCC)    Chronic diastolic heart failure, NYHA class 2 (HCC)    Normal LVEDP by May 2018   COPD (chronic obstructive pulmonary disease) (HCC)    Depression 2000   DIABETES MELLITUS, TYPE II 08/21/2006   Diabetic osteomyelitis (HCC) 05/29/2015   Fatty liver    Fracture of 5th metatarsal    non union   GERD 08/21/2006   Had fundoplication   GOUT 08/20/2010   Hammer toe of right foot    2-5th toes   Hx of umbilical hernia repair    HYPERLIPIDEMIA 08/21/2006   HYPERTENSION 08/21/2006   Infection of prosthetic left knee joint 08/29/2020   Infection of wound due to methicillin resistant Staphylococcus aureus (MRSA)    Internal hemorrhoids    Kidney problem    Multiple allergies 10/14/2016   OBESITY 06/04/2009   Onychomycosis 10/27/2015   Osteomyelitis of left foot (HCC) 05/29/2015   Osteoporosis    Pulmonary sarcoidosis    Followed locally by pulmonology, but also by [facility] Lavella at [facility]   Right knee pain 01/26/2017   Vocal cord dysfunction     Wears partial dentures     Past Surgical History Past Surgical History:  Procedure Laterality Date   ABDOMINAL HYSTERECTOMY     APPENDECTOMY     BLADDER SUSPENSION  11/11/2011   Procedure: TRANSVAGINAL TAPE (TVT) PROCEDURE;  Surgeon: Oneil FORBES Piety, MD;  Location: [facility];  Service: Gynecology;  Laterality: N/A;   CAROTIDS  02/18/2011   CAROTID DUPLEX; VERTEBRALS ARE PATENT WITH ANTEGRADE FLOW. ICA/CCA RATIO 1.61 ON RIGHT AND 0.75 ON LEFT   CATARACT EXTRACTION, BILATERAL     CHOLECYSTECTOMY  1984   COLONOSCOPY  04/29/2010   [facility]   CYSTOSCOPY  11/11/2011   Procedure: CYSTOSCOPY;  Surgeon: Oneil FORBES Piety, MD;  Location: [facility];  Service: Gynecology;  Laterality: N/A;   EXTUBATION (ENDOTRACHEAL) IN OR N/A 09/23/2017   Procedure: EXTUBATION (ENDOTRACHEAL) IN OR;  Surgeon: Terri Alan PARAS, MD;  Location: [facility];  Service: ENT;  Laterality: N/A;   FIBEROPTIC LARYNGOSCOPY AND TRACHEOSCOPY N/A 09/23/2017   Procedure: FLEXIBLE FIBEROPTIC LARYNGOSCOPY;  Surgeon: Terri Alan PARAS, MD;  Location: [facility];  Service: ENT;  Laterality: N/A;   FRACTURE SURGERY     foot   HALLUX FUSION Left 10/02/2018   Procedure: HALLUX  ARTHRODESIS;  Surgeon: Janit Thresa HERO, DPM;  Location: [facility];  Service: Podiatry;  Laterality: Left;   HAMMER TOE SURGERY Left 10/02/2018   Procedure: HAMMER TOE CORRECTION 2ND 3RD 4RD FIFTH TOE;  Surgeon: Janit Thresa HERO, DPM;  Location: [facility];  Service: Podiatry;  Laterality: Left;   HAMMER TOE SURGERY Right 04/12/2019   Procedure: HAMMER TOE CORRECTION, 2ND, 3RD, 4TH AND 5TH TOES OF RIGHT FOOT;  Surgeon: Janit Thresa HERO, DPM;  Location: [facility];  Service: Podiatry;  Laterality: Right;   HAMMER TOE SURGERY Left 10/25/2019   Procedure: HAMMER TOE REPAIR SECOND, THIRD, FOUTH;  Surgeon: Janit Thresa HERO, DPM;  Location: [facility];  Service: Podiatry;  Laterality: Left;   HERNIA REPAIR     I & D EXTREMITY Left 06/27/2015   Procedure: Partial Excision Left Calcaneus, Place  Antibiotic Beads, and Wound VAC;  Surgeon: Jerona Harden GAILS, MD;  Location: [facility];  Service: Orthopedics;  Laterality: Left;   JOINT REPLACEMENT     KNEE ARTHROSCOPY     right   LAPAROSCOPIC ASSISTED VENTRAL HERNIA REPAIR N/A 03/26/2024   Procedure: REPAIR, HERNIA, VENTRAL, LAPAROSCOPY-ASSISTED;  Surgeon: Rubin Calamity, MD;  Location: [facility];  Service: General;  Laterality: N/A;  LAP VENTRAL HERNIA REPAIR WITH MESH   LAPAROSCOPIC LYSIS OF ADHESIONS N/A 03/26/2024   Procedure: LYSIS, ADHESIONS, LAPAROSCOPIC;  Surgeon: Rubin Calamity, MD;  Location: [facility] OR;  Service: General;  Laterality: N/A;   LAPAROSCOPY N/A 03/26/2024   Procedure: LAPAROSCOPY, DIAGNOSTIC;  Surgeon: Rubin Calamity, MD;  Location: [facility] OR;  Service: General;  Laterality: N/A;   LEFT AND RIGHT HEART CATHETERIZATION WITH CORONARY ANGIOGRAM N/A 04/23/2013   Procedure: LEFT AND RIGHT HEART CATHETERIZATION WITH CORONARY ANGIOGRAM;  Surgeon: Alm LELON Clay, MD;  Location: [facility] CATH LAB;  Service: Cardiovascular;  Laterality: N/A;   LEFT HEART CATH AND CORONARY ANGIOGRAPHY N/A 03/11/2017   Procedure: Left Heart Cath and Coronary Angiography;  Surgeon: Wonda Sharper, MD;  Location: [facility] INVASIVE CV LAB; angiographically minimal CAD in the LAD otherwise normal.;  Normal LVEDP.  FALSE POSITIVE MYOVIEW    LEFT HEART CATH AND CORONARY ANGIOGRAPHY  07/20/2010   LVEF 50-55% WITH VERY MILD GLOBAL HYPOKINESIA; ESSENTIALLY NORMAL CORONARY ARTERIES; NORMAL LV FUNCTION   METATARSAL OSTEOTOMY WITH OPEN REDUCTION INTERNAL FIXATION (ORIF) METATARSAL WITH FUSION Left 04/09/2014   Procedure: LEFT FOOT FRACTURE OPEN TREATMENT METATARSAL INCLUDES INTERNAL FIXATION EACH;  Surgeon: Lamar DELENA Millman, MD;  Location: [facility];  Service: Orthopedics;  Laterality: Left;   NISSEN FUNDOPLICATION  2004   NM MYOVIEW  LTD --> FALSE POSITIVE  03/10/2017   : Moderate size stress-induced perfusion defect at the apex as well as ill-defined stress-induced  perfusion defect in the lateral wall.  EF 55%.  INTERMEDIATE risk. -->  FALSE POSITIVE   ORIF DISTAL FEMUR FRACTURE  08/08/2020   [facility] [place] [facility]: ORIF of left extra-articular periprosthetic distal femur fracture with synthesis of the femur plate with cortical nonlocking screws.  (Thought to be pathologic fracture due to osteoporosis) -> after a fall   ORIF FEMUR W/ PERI-IMPLANT  08/29/2020   [facility]) revision of left femur fracture ORIF plate fixation screws; culture positive for MSSA   Right and left CARDIAC CATHETERIZATION  04/23/2013   Angiographic normal coronaries; LVEDP 20 mmHg, PCWP 12-14 mmHg, RAP 12 mmHg.; Fick CO/CI 4.9/2.2   ROTATOR CUFF REPAIR Right 12/09/2021   SHOULDER ARTHROSCOPY Left 03/14/2019   Procedure: LEFT SHOULDER ARTHROSCOPY, DEBRIDEMENT, AND DECOMPRESSION;  Surgeon:  Harden Jerona GAILS, MD;  Location: [facility] OR;  Service: Orthopedics;  Laterality: Left;   TOTAL KNEE ARTHROPLASTY Right 06/29/2017   Procedure: RIGHT TOTAL KNEE ARTHROPLASTY;  Surgeon: Harden Jerona GAILS, MD;  Location: [facility] OR;  Service: Orthopedics;  Laterality: Right;   TOTAL KNEE ARTHROPLASTY Left 12/07/2017   TOTAL KNEE ARTHROPLASTY Left 12/07/2017   Procedure: LEFT TOTAL KNEE ARTHROPLASTY;  Surgeon: Harden Jerona GAILS, MD;  Location: [facility] OR;  Service: Orthopedics;  Laterality: Left;   TRACHEOSTOMY TUBE PLACEMENT N/A 09/20/2017   Procedure: AWAKE INTUBATION WITH ANESTHESIA WITH VIDEO ASSISTANCE;  Surgeon: Terri Alan PARAS, MD;  Location[facility];  Service: ENT;  Laterality: N/A;   TRANSTHORACIC ECHOCARDIOGRAM  08/2014   Normal LV size and function.  Mild LVH.  EF 55-60%.  Normal regional wall motion.  GR 1 DD.  Normal RV size and function .   TRANSTHORACIC ECHOCARDIOGRAM  08/12/2020    [facility]): Normal LV size and function.  EF 55 to 60%.  No RWM A.  Normal filling pattern.  Normal RV.  No significant valvular disease-stenosis or regurgitation.  No vegetation.   TUBAL LIGATION      with reversal in 1994   UPPER GASTROINTESTINAL ENDOSCOPY     VENTRAL HERNIA REPAIR      Family History family history includes Asthma in her mother; Breast cancer in her mother; COPD in her mother; Coronary artery disease in her father; Diabetes in her brother, brother, father, and mother; Emphysema in her mother; Heart attack in her maternal grandfather; Heart attack (age of onset: 42) in her father; Heart disease in her father; Heart failure in her father and mother; Hypertension in her father; Kidney disease in her mother; Lung cancer in her brother; Obesity in her father; Sarcoidosis in her maternal uncle; Sleep apnea in her father; Throat cancer in her father; Thyroid  disease in her mother.  Social History Social History   Socioeconomic History   Marital status: Married    Spouse name: FIDELIS LOTH   Number of children: 2   Years of education: 12   Highest education level: 12th grade  Occupational History   Occupation: DISABLED  Tobacco Use   Smoking status: Never   Smokeless tobacco: Never  Vaping Use   Vaping status: Never Used  Substance and Sexual Activity   Alcohol use: No   Drug use: No   Sexual activity: Yes    Birth control/protection: Surgical  Other Topics Concern   Not on file  Social History Narrative   Married 1994. 2 sons who both live close and 1 grandson.       Disability due to sarcoidosis. 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 [employer]   Social Drivers of Health   Financial Resource Strain: Low Risk  (07/23/2024)   Overall Financial Resource Strain (CARDIA)    Difficulty of Paying Living Expenses: Not hard at all  Food Insecurity: No Food Insecurity (07/23/2024)   Hunger Vital Sign    Worried About Running Out of Food in the Last Year: Never true    Ran Out of Food in the Last Year: Never true  Transportation  Needs: No Transportation Needs (07/23/2024)   PRAPARE - Administrator, Civil Service (Medical): No    Lack of Transportation (Non-Medical): No  Physical Activity: Inactive (07/23/2024)   Exercise Vital  Sign    Days of Exercise per Week: 0 days    Minutes of Exercise per Session: Not on file  Stress: No Stress Concern Present (07/23/2024)   [employer] of Occupational Health - Occupational Stress Questionnaire    Feeling of Stress: Not at all  Social Connections: Socially Integrated (07/23/2024)   Social Connection and Isolation Panel    Frequency of Communication with Friends and Family: More than three times a week    Frequency of Social Gatherings with Friends and Family: Once a week    Attends Religious Services: More than 4 times per year    Active Member of Clubs or Organizations: Yes    Attends Banker Meetings: More than 4 times per year    Marital Status: Married  Catering Manager Violence: Not At Risk (04/05/2024)   Humiliation, Afraid, Rape, and Kick questionnaire    Fear of Current or Ex-Partner: No    Emotionally Abused: No    Physically Abused: No    Sexually Abused: No    Lab Results  Component Value Date   HGBA1C 5.9 (A) 08/21/2024   HGBA1C 6.4 (H) 03/14/2024   HGBA1C 6.9 (A) 01/18/2024   Lab Results  Component Value Date   CHOL 151 10/20/2023   Lab Results  Component Value Date   HDL 50 10/20/2023   Lab Results  Component Value Date   LDLCALC 76 10/20/2023   Lab Results  Component Value Date   TRIG 157 (H) 10/20/2023   Lab Results  Component Value Date   CHOLHDL 3.0 10/20/2023   Lab Results  Component Value Date   CREATININE 1.67 (H) 08/18/2024   Lab Results  Component Value Date   GFR 46.92 (L) 01/13/2024   Lab Results  Component Value Date   MICROALBUR 1.4 10/20/2023      Component Value Date/Time   NA 140 08/18/2024 2341   NA 139 10/20/2020 0000   NA 135 (L) 04/17/2014 1039   K 4.1 08/18/2024 2341    K 4.5 04/17/2014 1039   CL 101 08/18/2024 2341   CO2 26 08/18/2024 2341   CO2 26 04/17/2014 1039   GLUCOSE 104 (H) 08/18/2024 2341   GLUCOSE 300 (H) 04/17/2014 1039   GLUCOSE 150 (H) 09/14/2006 1033   BUN 23 08/18/2024 2341   BUN 10 10/20/2020 0000   BUN 13.9 04/17/2014 1039   CREATININE 1.67 (H) 08/18/2024 2341   CREATININE 1.30 (H) 08/16/2024 0946   CREATININE 0.8 04/17/2014 1039   CALCIUM  9.5 08/18/2024 2341   CALCIUM  10.0 04/17/2014 1039   PROT 6.1 (L) 07/21/2024 1913   PROT 6.7 03/31/2020 1316   PROT 6.8 04/17/2014 1039   ALBUMIN 4.0 07/21/2024 1913   ALBUMIN 4.6 03/31/2020 1316   ALBUMIN 3.7 04/17/2014 1039   AST 19 07/21/2024 1913   AST 8 04/17/2014 1039   ALT 14 07/21/2024 1913   ALT 20 04/17/2014 1039   ALKPHOS 48 07/21/2024 1913   ALKPHOS 82 04/17/2014 1039   BILITOT 0.5 07/21/2024 1913   BILITOT 0.3 03/31/2020 1316   BILITOT 0.45 04/17/2014 1039   GFRNONAA 34 (L) 08/18/2024 2341   GFRNONAA 71 08/21/2020 1404   GFRAA 96 10/20/2020 0000   GFRAA 82 08/21/2020 1404      Latest Ref Rng & Units 08/18/2024   11:41 PM 08/16/2024    9:46 AM 07/21/2024    7:13 PM  BMP  Glucose 70 - 99 mg/dL 895  874  821   BUN 8 -  23 mg/dL 23  19  28    Creatinine 0.44 - 1.00 mg/dL 8.32  8.69  8.38   BUN/Creat Ratio 6 - 22 (calc)  15    Sodium 135 - 145 mmol/L 140  143  138   Potassium 3.5 - 5.1 mmol/L 4.1  4.8  4.2   Chloride 98 - 111 mmol/L 101  104  101   CO2 22 - 32 mmol/L 26  29  27    Calcium  8.9 - 10.3 mg/dL 9.5  9.7  9.4        Component Value Date/Time   WBC 14.9 (H) 08/18/2024 2341   RBC 3.26 (L) 08/18/2024 2341   HGB 9.8 (L) 08/18/2024 2341   HGB 12.1 04/17/2014 1039   HCT 29.4 (L) 08/18/2024 2341   HCT 36.9 04/17/2014 1039   PLT 362 08/18/2024 2341   PLT 436 (H) 04/17/2014 1039   MCV 90.2 08/18/2024 2341   MCV 87.9 04/17/2014 1039   MCH 30.1 08/18/2024 2341   MCHC 33.3 08/18/2024 2341   RDW 13.6 08/18/2024 2341   RDW 14.5 04/17/2014 1039   LYMPHSABS 4.0  08/18/2024 2341   LYMPHSABS 2.2 04/17/2014 1039   MONOABS 1.0 08/18/2024 2341   MONOABS 0.8 04/17/2014 1039   EOSABS 0.5 08/18/2024 2341   EOSABS 0.1 04/17/2014 1039   BASOSABS 0.1 08/18/2024 2341   BASOSABS 0.0 04/17/2014 1039     Parts of this note may have been dictated using voice recognition software. There may be variances in spelling and vocabulary which are unintentional. Not all errors are proofread. Please notify the dino if any discrepancies are noted or if the meaning of any statement is not clear.

## 2024-08-21 NOTE — Patient Instructions (Signed)

## 2024-08-22 ENCOUNTER — Ambulatory Visit: Admitting: Adult Health

## 2024-08-24 ENCOUNTER — Ambulatory Visit: Admitting: Family Medicine

## 2024-08-24 ENCOUNTER — Telehealth: Payer: Self-pay | Admitting: Podiatry

## 2024-08-24 NOTE — Telephone Encounter (Signed)
 Called and scheduled patient for surgery on 09/27/2024. Patient on GLP1 and advised to stop week prior as well as her aspirin . Patient pharmacy correct in chart.

## 2024-08-27 ENCOUNTER — Encounter: Payer: Self-pay | Admitting: Podiatry

## 2024-08-30 ENCOUNTER — Other Ambulatory Visit: Payer: Self-pay | Admitting: Physician Assistant

## 2024-08-30 ENCOUNTER — Telehealth: Payer: Self-pay | Admitting: Podiatry

## 2024-08-30 NOTE — Telephone Encounter (Signed)
 DOS- 09/27/2024  METATARSAL HEAD RES 5TH RT- 71886  [employer] EFFECTIVE DATE- 10/12/2023  DEDUCTIBLE- $500 REMAINING- $241.82 OOP- $2000 REMAINING- $0 FAMILY DEDUCTIBLE- $1000 REMAINING- $705.98 FAMILY OOP- $4000 REMAINING- $1219.28 COINSURANCE- 10%  PER AVAILITY PORTAL, NO PRIOR AUTH IS REQUIRED FOR CPT CODE 71886. DOCUMENTATION ATTACHED TO SURGERY CONSENT PACKET.

## 2024-09-09 ENCOUNTER — Other Ambulatory Visit: Payer: Self-pay | Admitting: Family Medicine

## 2024-09-12 ENCOUNTER — Ambulatory Visit: Payer: Self-pay

## 2024-09-12 NOTE — Telephone Encounter (Signed)
 Message from Anthony R sent at 09/12/2024 12:16 PM EST  Pt experiencing pain during urination along with other symptoms that her husband could not remember so he ask if a nurse can call the pt as they are not currently together. Phone number listed on chart is the best number to call/

## 2024-09-12 NOTE — Telephone Encounter (Signed)
Pt scheduled for appt.

## 2024-09-12 NOTE — Telephone Encounter (Signed)
 This RN attempted x1 to contact patient, no answer, left voicemail message.

## 2024-09-12 NOTE — Telephone Encounter (Signed)
 FYI Only or Action Required?: FYI only for provider: appointment scheduled on 09/13/2024 at 8 AM at [facility].  Patient was last seen in primary care on 08/14/2024 by Katrinka Garnette KIDD, MD.  Called Nurse Triage reporting Dysuria and Urinary Frequency.  Symptoms began today.  Interventions attempted: Rest, hydration, or home remedies.  Symptoms are: unchanged.  Triage Disposition: See Physician Within 24 Hours  Patient/caregiver understands and will follow disposition?: Yes  Message from [place] R sent at 09/12/2024 12:16 PM EST  Pt experiencing pain during urination along with other symptoms that her husband could not remember so he ask if a nurse can call the pt as they are not currently together. Phone number listed on chart is the best number to call/   Reason for Disposition  All other patients with painful urination  (Exception: [1] EITHER frequency or urgency AND [2] has on-call doctor.)  Answer Assessment - Initial Assessment Questions Reports pain with urination along with urinary frequency. Does have a hx of UTIs. Requesting to be seen tomorrow. Scheduled to see a provider at [facility] tomorrow at 8 AM  1. SEVERITY: How bad is the pain?  (e.g., Scale 1-10; mild, moderate, or severe)     5 out of 10 2. FREQUENCY: How many times have you had painful urination today?      3-4 times today 3. PATTERN: Is pain present every time you urinate or just sometimes?      Every time 4. ONSET: When did the painful urination start?      Today but patient states she felt like she might have been having it all week.  5. FEVER: Do you have a fever? If Yes, ask: What is your temperature, how was it measured, and when did it start?     no 6. PAST UTI: Have you had a urine infection before? If Yes, ask: When was the last time? and What happened that time?      Yes-last times 3-4 weeks ago 7. CAUSE: What do you think is causing the painful urination?  (e.g., UTI, scratch,  Herpes sore)     UTI 8. OTHER SYMPTOMS: Do you have any other symptoms? (e.g., blood in urine, flank pain, genital sores, urgency, vaginal discharge)     Flank pain, frequency  Protocols used: Urination Pain - Female-A-AH

## 2024-09-13 ENCOUNTER — Ambulatory Visit: Admitting: Family Medicine

## 2024-09-13 ENCOUNTER — Encounter: Payer: Self-pay | Admitting: Family Medicine

## 2024-09-13 VITALS — BP 114/62 | HR 76 | Temp 97.8°F | Wt 175.0 lb

## 2024-09-13 DIAGNOSIS — R300 Dysuria: Secondary | ICD-10-CM

## 2024-09-13 DIAGNOSIS — N3001 Acute cystitis with hematuria: Secondary | ICD-10-CM | POA: Diagnosis not present

## 2024-09-13 LAB — POCT URINALYSIS DIPSTICK
Bilirubin, UA: NEGATIVE
Blood, UA: POSITIVE — AB
Glucose, UA: NEGATIVE
Ketones, UA: NEGATIVE
Nitrite, UA: NEGATIVE
Protein, UA: POSITIVE — AB
Spec Grav, UA: 1.02 (ref 1.010–1.025)
Urobilinogen, UA: 0.2 U/dL
pH, UA: 6 (ref 5.0–8.0)

## 2024-09-13 MED ORDER — NITROFURANTOIN MONOHYD MACRO 100 MG PO CAPS: 100.0000 mg | ORAL_CAPSULE | Freq: Two times a day (BID) | ORAL | 0 refills | Status: AC

## 2024-09-13 NOTE — Patient Instructions (Signed)
 Upon looking at your med list it seems you have a history of intolerance to a few antibiotics which cause rash.  A prescription for Macrobid  was sent to your pharmacy.  You can take 1 pill twice a day.  If we need to contact you regarding the culture results and changing the medicine we will.  Increase your fluid intake.

## 2024-09-13 NOTE — Progress Notes (Signed)
 Established Patient Office Visit   Subjective  Patient ID: [name], female    DOB: Nov 26, 1960  Age: 63 y.o. MRN: [id number]  Chief Complaint  Patient presents with   Acute Visit    Patient came in for pain with urination and hurts in pelvic area for 4 days.    Patient is a 63 year old female followed by Dr. Katrinka and seen for acute concern.  Patient endorses dysuria, suprapubic pain, urinary frequency, decreased UOP, and mild low back pain x 4 days.  Patient denies fever, chills, nausea, vomiting, constipation.  Denies history of renal calculi.  Drinking 3 16.9 ounce bottles of water  per day.    Patient Active Problem List   Diagnosis Date Noted   Impaired gait 04/05/2024   Decreased activities of daily living (ADL) 04/05/2024   Impaired functional mobility, balance, gait, and endurance 04/05/2024   Left carpal tunnel syndrome 03/24/2023   Arthritis of carpometacarpal (CMC) joint of left thumb 03/10/2023   Radial styloid tenosynovitis (de quervain) 03/10/2023   Trigger finger of left thumb 03/10/2023   Pain of left hand 04/19/2022   Preop cardiovascular exam 12/07/2021   Subacromial bursitis of right shoulder joint 08/03/2021   Facet arthropathy, cervical 06/29/2021   Foraminal stenosis of cervical region 06/29/2021   Neck pain, musculoskeletal 05/15/2021   Iron  deficiency anemia 10/01/2020   Postural dizziness with presyncope 09/30/2020   Benign essential hypertension 08/07/2020   Hypertriglyceridemia 04/02/2020   Aortic atherosclerosis 02/14/2020   Nontraumatic complete tear of left rotator cuff    Diabetic neuropathy (HCC) 02/20/2019   Severe persistent asthma (HCC) 01/04/2019   Subcortical microvascular ischemic occlusive disease 07/12/2018   Rapid palpitations 07/12/2018   Impingement of left ankle joint 06/26/2018   Constipation 04/21/2018   Total knee replacement status, left 12/07/2017   Oropharyngeal dysphagia 09/20/2017   Epiglottitis    Chronic  diastolic CHF (congestive heart failure) (HCC) 08/13/2017   Plantar fasciitis, right 07/13/2017   S/P total knee arthroplasty, right 06/29/2017   Osteoarthrosis, localized, primary, knee, right    Sprain of calcaneofibular ligament of right ankle 06/06/2017   Osteoarthritis of right knee 01/26/2017   Onychomycosis 10/27/2015   Chronic kidney disease, stage 3b (HCC) 04/27/2015   MRSA (methicillin resistant staph aureus) culture positive 03/27/2015   History of osteomyelitis 03/27/2015   Fatty liver 09/30/2014   Hot flashes 07/15/2014   Abnormal SPEP 04/17/2014   Fracture of left leg 04/17/2014   Internal hemorrhoids    Pulmonary sarcoidosis    Major depression in full remission    Preoperative clearance 03/25/2014   Chronic bronchitis (HCC) in never smoker 09/18/2013   Obstructive chronic bronchitis with exacerbation (HCC) 09/18/2013   Chest pain 04/11/2013   Hypertensive heart disease with chronic diastolic congestive heart failure (HCC)    Solitary pulmonary nodule, on CT 02/2013 - stable over 2 years in 2015 02/20/2013   Gout 08/20/2010   Deficiency anemia 09/18/2009   Sleep apnea 04/21/2009   Sarcoidosis of lung 04/10/2007   Hyperlipidemia associated with type 2 diabetes mellitus (HCC) 08/21/2006   Hypertension 08/21/2006   GERD 08/21/2006   Type II diabetes mellitus with renal manifestations (HCC) 08/21/2006   Past Medical History:  Diagnosis Date   Abnormal SPEP 04/17/2014   Acute left ankle pain 01/26/2017   Allergy    ANEMIA-UNSPECIFIED 09/18/2009   Anxiety    Arthritis    Blood transfusion without reported diagnosis    Cataract    CHF (congestive heart failure) (HCC)  Chronic diastolic heart failure, NYHA class 2 (HCC)    Normal LVEDP by May 2018   COPD (chronic obstructive pulmonary disease) (HCC)    Depression 2000   DIABETES MELLITUS, TYPE II 08/21/2006   Diabetic osteomyelitis (HCC) 05/29/2015   Fatty liver    Fracture of 5th metatarsal    non union    GERD 08/21/2006   Had fundoplication   GOUT 08/20/2010   Hammer toe of right foot    2-5th toes   Hx of umbilical hernia repair    HYPERLIPIDEMIA 08/21/2006   HYPERTENSION 08/21/2006   Infection of prosthetic left knee joint 08/29/2020   Infection of wound due to methicillin resistant Staphylococcus aureus (MRSA)    Internal hemorrhoids    Kidney problem    Multiple allergies 10/14/2016   OBESITY 06/04/2009   Onychomycosis 10/27/2015   Osteomyelitis of left foot (HCC) 05/29/2015   Osteoporosis    Pulmonary sarcoidosis    Followed locally by pulmonology, but also by Dr. Lavella at [facility]   Right knee pain 01/26/2017   Vocal cord dysfunction    Wears partial dentures    Past Surgical History:  Procedure Laterality Date   ABDOMINAL HYSTERECTOMY     APPENDECTOMY     BLADDER SUSPENSION  11/11/2011   Procedure: TRANSVAGINAL TAPE (TVT) PROCEDURE;  Surgeon: Oneil FORBES Piety, MD;  Location: [facility];  Service: Gynecology;  Laterality: N/A;   CAROTIDS  02/18/2011   CAROTID DUPLEX; VERTEBRALS ARE PATENT WITH ANTEGRADE FLOW. ICA/CCA RATIO 1.61 ON RIGHT AND 0.75 ON LEFT   CATARACT EXTRACTION, BILATERAL     CHOLECYSTECTOMY  1984   COLONOSCOPY  04/29/2010   [facility]   CYSTOSCOPY  11/11/2011   Procedure: CYSTOSCOPY;  Surgeon: Oneil FORBES Piety, MD;  Location: [facility];  Service: Gynecology;  Laterality: N/A;   EXTUBATION (ENDOTRACHEAL) IN OR N/A 09/23/2017   Procedure: EXTUBATION (ENDOTRACHEAL) IN OR;  Surgeon: Terri Alan PARAS, MD;  Location: [facility];  Service: ENT;  Laterality: N/A;   FIBEROPTIC LARYNGOSCOPY AND TRACHEOSCOPY N/A 09/23/2017   Procedure: FLEXIBLE FIBEROPTIC LARYNGOSCOPY;  Surgeon: Terri Alan PARAS, MD;  Location: [facility] OR;  Service: ENT;  Laterality: N/A;   FRACTURE SURGERY     foot   HALLUX FUSION Left 10/02/2018   Procedure: HALLUX ARTHRODESIS;  Surgeon: Janit Thresa HERO, DPM;  Location: [facility];  Service: Podiatry;  Laterality: Left;   HAMMER TOE SURGERY Left  10/02/2018   Procedure: HAMMER TOE CORRECTION 2ND 3RD 4RD FIFTH TOE;  Surgeon: Janit Thresa HERO, DPM;  Location: [facility];  Service: Podiatry;  Laterality: Left;   HAMMER TOE SURGERY Right 04/12/2019   Procedure: HAMMER TOE CORRECTION, 2ND, 3RD, 4TH AND 5TH TOES OF RIGHT FOOT;  Surgeon: Janit Thresa HERO, DPM;  Location: [facility];  Service: Podiatry;  Laterality: Right;   HAMMER TOE SURGERY Left 10/25/2019   Procedure: HAMMER TOE REPAIR SECOND, THIRD, FOUTH;  Surgeon: Janit Thresa HERO, DPM;  Location: [facility];  Service: Podiatry;  Laterality: Left;   HERNIA REPAIR     I & D EXTREMITY Left 06/27/2015   Procedure: Partial Excision Left Calcaneus, Place Antibiotic Beads, and Wound VAC;  Surgeon: Jerona Harden GAILS, MD;  Location: [facility];  Service: Orthopedics;  Laterality: Left;   JOINT REPLACEMENT     KNEE ARTHROSCOPY     right   LAPAROSCOPIC ASSISTED VENTRAL HERNIA REPAIR N/A 03/26/2024   Procedure: REPAIR, HERNIA, VENTRAL, LAPAROSCOPY-ASSISTED;  Surgeon: Rubin Calamity, MD;  Location: [facility];  Service: General;  Laterality: N/A;  LAP VENTRAL HERNIA REPAIR WITH MESH   LAPAROSCOPIC LYSIS OF ADHESIONS N/A 03/26/2024   Procedure: LYSIS, ADHESIONS, LAPAROSCOPIC;  Surgeon: Rubin Calamity, MD;  Location: [facility] OR;  Service: General;  Laterality: N/A;   LAPAROSCOPY N/A 03/26/2024   Procedure: LAPAROSCOPY, DIAGNOSTIC;  Surgeon: Rubin Calamity, MD;  Location: [facility] OR;  Service: General;  Laterality: N/A;   LEFT AND RIGHT HEART CATHETERIZATION WITH CORONARY ANGIOGRAM N/A 04/23/2013   Procedure: LEFT AND RIGHT HEART CATHETERIZATION WITH CORONARY ANGIOGRAM;  Surgeon: Alm LELON Clay, MD;  Location: [facility] CATH LAB;  Service: Cardiovascular;  Laterality: N/A;   LEFT HEART CATH AND CORONARY ANGIOGRAPHY N/A 03/11/2017   Procedure: Left Heart Cath and Coronary Angiography;  Surgeon: Wonda Sharper, MD;  Location: [facility] INVASIVE CV LAB; angiographically minimal CAD in the LAD otherwise normal.;  Normal LVEDP.  FALSE POSITIVE MYOVIEW    LEFT  HEART CATH AND CORONARY ANGIOGRAPHY  07/20/2010   LVEF 50-55% WITH VERY MILD GLOBAL HYPOKINESIA; ESSENTIALLY NORMAL CORONARY ARTERIES; NORMAL LV FUNCTION   METATARSAL OSTEOTOMY WITH OPEN REDUCTION INTERNAL FIXATION (ORIF) METATARSAL WITH FUSION Left 04/09/2014   Procedure: LEFT FOOT FRACTURE OPEN TREATMENT METATARSAL INCLUDES INTERNAL FIXATION EACH;  Surgeon: Lamar DELENA Millman, MD;  Location: [facility];  Service: Orthopedics;  Laterality: Left;   NISSEN FUNDOPLICATION  2004   NM MYOVIEW  LTD --> FALSE POSITIVE  03/10/2017   : Moderate size stress-induced perfusion defect at the apex as well as ill-defined stress-induced perfusion defect in the lateral wall.  EF 55%.  INTERMEDIATE risk. -->  FALSE POSITIVE   ORIF DISTAL FEMUR FRACTURE  08/08/2020   [facility]: ORIF of left extra-articular periprosthetic distal femur fracture with synthesis of the femur plate with cortical nonlocking screws.  (Thought to be pathologic fracture due to osteoporosis) -> after a fall   ORIF FEMUR W/ PERI-IMPLANT  08/29/2020   [facility]) revision of left femur fracture ORIF plate fixation screws; culture positive for MSSA   Right and left CARDIAC CATHETERIZATION  04/23/2013   Angiographic normal coronaries; LVEDP 20 mmHg, PCWP 12-14 mmHg, RAP 12 mmHg.; Fick CO/CI 4.9/2.2   ROTATOR CUFF REPAIR Right 12/09/2021   SHOULDER ARTHROSCOPY Left 03/14/2019   Procedure: LEFT SHOULDER ARTHROSCOPY, DEBRIDEMENT, AND DECOMPRESSION;  Surgeon: Harden Jerona GAILS, MD;  Location: [facility];  Service: Orthopedics;  Laterality: Left;   TOTAL KNEE ARTHROPLASTY Right 06/29/2017   Procedure: RIGHT TOTAL KNEE ARTHROPLASTY;  Surgeon: Harden Jerona GAILS, MD;  Location: [facility] OR;  Service: Orthopedics;  Laterality: Right;   TOTAL KNEE ARTHROPLASTY Left 12/07/2017   TOTAL KNEE ARTHROPLASTY Left 12/07/2017   Procedure: LEFT TOTAL KNEE ARTHROPLASTY;  Surgeon: Harden Jerona GAILS, MD;  Location: [facility] OR;  Service: Orthopedics;   Laterality: Left;   TRACHEOSTOMY TUBE PLACEMENT N/A 09/20/2017   Procedure: AWAKE INTUBATION WITH ANESTHESIA WITH VIDEO ASSISTANCE;  Surgeon: Terri Alan PARAS, MD;  Location: [facility] OR;  Service: ENT;  Laterality: N/A;   TRANSTHORACIC ECHOCARDIOGRAM  08/2014   Normal LV size and function.  Mild LVH.  EF 55-60%.  Normal regional wall motion.  GR 1 DD.  Normal RV size and function .   TRANSTHORACIC ECHOCARDIOGRAM  08/12/2020    [facility]): Normal LV size and function.  EF 55 to 60%.  No RWM A.  Normal filling pattern.  Normal RV.  No significant valvular disease-stenosis or regurgitation.  No vegetation.   TUBAL LIGATION     with reversal in 1994   UPPER  GASTROINTESTINAL ENDOSCOPY     VENTRAL HERNIA REPAIR     Social History   Tobacco Use   Smoking status: Never   Smokeless tobacco: Never  Vaping Use   Vaping status: Never Used  Substance Use Topics   Alcohol use: No   Drug use: No   Family History  Problem Relation Age of Onset   Diabetes Father    Heart attack Father 58   Coronary artery disease Father    Heart failure Father    Throat cancer Father    Hypertension Father    Heart disease Father    Sleep apnea Father    Obesity Father    COPD Mother    Emphysema Mother    Asthma Mother    Heart failure Mother    Breast cancer Mother    Diabetes Mother    Kidney disease Mother    Thyroid  disease Mother    Heart attack Maternal Grandfather    Sarcoidosis Maternal Uncle    Lung cancer Brother    Diabetes Brother    Diabetes Brother    Colon cancer Neg Hx    Colon polyps Neg Hx    Esophageal cancer Neg Hx    Rectal cancer Neg Hx    Stomach cancer Neg Hx    Allergies  Allergen Reactions   Firvanq  [Vancomycin ] Other (See Comments)    Dose related nephrotoxicity    Methotrexate Other (See Comments)    Peri-oral and buccal lesions   Lisinopril  Cough   Chlorhexidine  Itching   Cleocin  [Clindamycin ] Nausea And Vomiting and Rash   Lincocin [Lincomycin  Hcl] Nausea And Vomiting and Rash   Teflaro [Ceftaroline] Rash and Other (See Comments)    Tolerates ceftriaxone      ROS Negative unless stated above    Objective:     BP 114/62 (BP Location: Left Arm, Patient Position: Sitting, Cuff Size: Normal)   Pulse 76   Temp 97.8 F (36.6 C) (Oral)   Wt 175 lb (79.4 kg)   SpO2 98%   BMI 28.25 kg/m  BP Readings from Last 3 Encounters:  09/13/24 114/62  08/21/24 102/60  08/19/24 (!) 128/53   Wt Readings from Last 3 Encounters:  09/13/24 175 lb (79.4 kg)  08/21/24 181 lb (82.1 kg)  08/18/24 177 lb (80.3 kg)      Physical Exam Constitutional:      General: She is not in acute distress.    Appearance: Normal appearance.  HENT:     Head: Normocephalic and atraumatic.     Nose: Nose normal.     Mouth/Throat:     Mouth: Mucous membranes are moist.  Cardiovascular:     Rate and Rhythm: Normal rate and regular rhythm.     Heart sounds: Normal heart sounds. No murmur heard.    No gallop.  Pulmonary:     Effort: Pulmonary effort is normal. No respiratory distress.     Breath sounds: Normal breath sounds. No wheezing, rhonchi or rales.  Abdominal:     General: Bowel sounds are normal. There is no distension.     Palpations: Abdomen is soft.     Tenderness: There is abdominal tenderness. There is no right CVA tenderness, left CVA tenderness or guarding.  Skin:    General: Skin is warm and dry.  Neurological:     Mental Status: She is alert and oriented to person, place, and time.        08/14/2024    2:22 PM 07/24/2024  3:28 PM 07/09/2024    9:57 AM  Depression screen PHQ 2/9  Decreased Interest 0 0 0  Down, Depressed, Hopeless 0 0 0  PHQ - 2 Score 0 0 0  Altered sleeping 0 0 0  Tired, decreased energy 0 0 0  Change in appetite 0 0 0  Feeling bad or failure about yourself  0 0 0  Trouble concentrating 0 0 0  Moving slowly or fidgety/restless 0 0 0  Suicidal thoughts 0 0 0  PHQ-9 Score 0  0  0   Difficult doing  work/chores Not difficult at all Not difficult at all Not difficult at all     Data saved with a previous flowsheet row definition      08/14/2024    2:22 PM 07/24/2024    3:28 PM 07/09/2024    9:57 AM 08/29/2023    3:24 PM  GAD 7 : Generalized Anxiety Score  Nervous, Anxious, on Edge 0 0 0 0  Control/stop worrying 0 0 0 0  Worry too much - different things 0 0 0 0  Trouble relaxing 0 0 0 0  Restless 0 0 0 0  Easily annoyed or irritable 0 0 0 0  Afraid - awful might happen 0 0 0 0  Total GAD 7 Score 0 0 0 0  Anxiety Difficulty Not difficult at all Not difficult at all Not difficult at all Not difficult at all     No results found for any visits on 09/13/24.    Assessment & Plan:   Acute cystitis with hematuria -     Urine Culture -     Nitrofurantoin  Monohyd Macro; Take 1 capsule (100 mg total) by mouth 2 (two) times daily for 7 days.  Dispense: 14 capsule; Refill: 0  Dysuria -     Urine Culture -     POCT urinalysis dipstick  Acute UTI symptoms.  POC UA with 3+ leuks, 1+ RBCs, protein, SG 1.020.  No nitrites.  Sent for culture.  Start ABX while awaiting results.  Increase fluid intake.  Given precautions.  Follow-up with PCP as needed.  Return if symptoms worsen or fail to improve.   Clotilda JONELLE Single, MD

## 2024-09-16 LAB — URINE CULTURE
MICRO NUMBER:: 17313234
SPECIMEN QUALITY:: ADEQUATE

## 2024-09-17 ENCOUNTER — Other Ambulatory Visit: Payer: Self-pay | Admitting: Family Medicine

## 2024-09-17 DIAGNOSIS — Z1231 Encounter for screening mammogram for malignant neoplasm of breast: Secondary | ICD-10-CM

## 2024-09-20 ENCOUNTER — Ambulatory Visit: Payer: Self-pay | Admitting: Family Medicine

## 2024-09-27 ENCOUNTER — Other Ambulatory Visit: Payer: Self-pay | Admitting: Podiatry

## 2024-09-27 DIAGNOSIS — M898X7 Other specified disorders of bone, ankle and foot: Secondary | ICD-10-CM | POA: Diagnosis not present

## 2024-09-27 MED ORDER — OXYCODONE-ACETAMINOPHEN 5-325 MG PO TABS: 1.0000 | ORAL_TABLET | ORAL | 0 refills | Status: AC | PRN

## 2024-09-27 MED ORDER — CEPHALEXIN 500 MG PO CAPS: 500.0000 mg | ORAL_CAPSULE | Freq: Four times a day (QID) | ORAL | 0 refills | Status: DC

## 2024-09-27 NOTE — Progress Notes (Signed)
 PRN postop

## 2024-10-03 ENCOUNTER — Ambulatory Visit: Admitting: Podiatry

## 2024-10-03 ENCOUNTER — Ambulatory Visit (INDEPENDENT_AMBULATORY_CARE_PROVIDER_SITE_OTHER)

## 2024-10-03 DIAGNOSIS — M898X7 Other specified disorders of bone, ankle and foot: Secondary | ICD-10-CM | POA: Diagnosis not present

## 2024-10-03 NOTE — Progress Notes (Signed)
 Patient presents for post-op visit today, POV #1 DOS 09/27/2024 RT METATARSAL HEAD RES 5TH  Doing good since surgery..  Notes: n/a  Vital Signs: Today's Vitals   10/03/24 9093  PainSc: 0-No pain      Radiographs: [x]  Taken []  Not taken  Surgical Site Assessment:  - Dressing:  [x]  Minimal dry blood, intact []  Reinforced   []  Changed     -Notes: n/a  - Incision:  [x]  CDI (clean, dry, intact)  []  Mild erythema  []  Drainage noted   -Notes: n/a  - Swelling:  []  None  [x]  Mild  []  Moderate   []  Significant     -Notes: n/a  - Bruising:  [x]  None  []  Present: n/a   - Sutures/Staples:  []  None [x]  Intact  []  Removed Today  [x]  Plan to remove at next visit   -Cast/Splint/Pins: [x]  None []  Intact []  Removed Today []  Plan to remove at next visit []  Replaced  -Signs of infection:  [x]  None  []  Present - Describe: n/a  -DME:    []  None [x]  AFW []  Surgical shoe []  Cast  []  Splint  -Walking status:  [x]  Full WB  []  Partial WB  []  NWB  -Utilizing device:  [x]  None []  Knee Scooter []  Crutches []  Wheelchair    DVT assessment:  [x]  Denies symptoms []  Chest pain/SOB []  Pain in calf/redness/warmth   Redressed DSD and ace wrap. Educated on signs of infection, proper dressing care, pain management, and weight bearing status. Patient will contact provider with any new or worsening symptoms. The provider assessed the patient today and reviewed instructions regarding plan of care.

## 2024-10-13 ENCOUNTER — Other Ambulatory Visit: Payer: Self-pay | Admitting: Internal Medicine

## 2024-10-15 ENCOUNTER — Ambulatory Visit

## 2024-10-16 ENCOUNTER — Encounter: Payer: Self-pay | Admitting: Adult Health

## 2024-10-16 ENCOUNTER — Ambulatory Visit: Admitting: Adult Health

## 2024-10-16 ENCOUNTER — Ambulatory Visit

## 2024-10-16 VITALS — BP 109/65 | HR 74 | Temp 97.7°F | Ht 66.0 in | Wt 172.2 lb

## 2024-10-16 DIAGNOSIS — D860 Sarcoidosis of lung: Secondary | ICD-10-CM

## 2024-10-16 DIAGNOSIS — D869 Sarcoidosis, unspecified: Secondary | ICD-10-CM | POA: Diagnosis not present

## 2024-10-16 DIAGNOSIS — Z23 Encounter for immunization: Secondary | ICD-10-CM | POA: Diagnosis not present

## 2024-10-16 DIAGNOSIS — J309 Allergic rhinitis, unspecified: Secondary | ICD-10-CM

## 2024-10-16 DIAGNOSIS — K219 Gastro-esophageal reflux disease without esophagitis: Secondary | ICD-10-CM

## 2024-10-16 DIAGNOSIS — J4540 Moderate persistent asthma, uncomplicated: Secondary | ICD-10-CM

## 2024-10-16 MED ORDER — ALBUTEROL SULFATE HFA 108 (90 BASE) MCG/ACT IN AERS: 1.0000 | INHALATION_SPRAY | RESPIRATORY_TRACT | 1 refills | Status: AC | PRN

## 2024-10-16 MED ORDER — FLUTICASONE FUROATE-VILANTEROL 200-25 MCG/ACT IN AEPB: 1.0000 | INHALATION_SPRAY | Freq: Every day | RESPIRATORY_TRACT | 3 refills | Status: AC

## 2024-10-16 NOTE — Patient Instructions (Addendum)
 Continue on BREO 1 puff daily  Albuterol  inhaler or neb As needed   Activity as able Zyrtec and Flonase  daily As needed   Continue on Protonix  and Pepcid   GERD diet.  Chest xray today  Flu shot today  Follow up with Dr. Theophilus or Eilleen Davoli NP in 1 year with PFT and As needed

## 2024-10-16 NOTE — Progress Notes (Signed)
 "  @Patient  ID: [name], female    DOB: [birth year], 64 y.o.   MRN: [id number]  Chief Complaint  Patient presents with   Medical Management of Chronic Issues    Asthma and sarcoidosis    Referring provider: Katrinka Garnette KIDD, MD  HPI: 64 year old female never smoker followed for asthma, chronic bronchitis, sarcoidosis Medical history significant for diastolic heart failure, diabetes, vocal cord dysfunction Sarcoidosis was diagnosed with mediastinoscopy in 2008, treated with chronic steroids for a long period of time weaned off in 2021 Previously followed at [facility] pulmonary with Dr. Lavella   TEST/EVENTS : Reviewed 10/16/2024  Hospitalization August 2023 for acute bronchitis, seen by ENT for vocal cord dysfunction-fiberoptic laryngoscopy showed normal vocal cord movement  Echo negative for pulmonary hypertension  CT chest August 2023 lungs clear, no  adenopathy, no PE  PFTs January 2024 mild to moderate restriction with FEV1 at 84%, ratio 86, FVC 75%, positive bronchodilator response, DLCO 94%.  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  No mold, hot tub, Jacuzzi.  No feather pillows or comforters Smoking history: Never smoker Travel history: No significant travel history Relevant family history: Uncle had sarcoidosis.   Discussed the use of AI scribe software for clinical note transcription with the patient, who gave verbal consent to proceed.  History of Present Illness [name] is a 64 year old female with asthma and sarcoidosis who presents for an annual checkup.  She reports no active symptoms of sarcoidosis and has not needed prednisone  since 2021. A CT scan in 2023 showed clear lungs and no lymph node involvement.  Her asthma is managed with Breo, one puff daily, and albuterol  as needed. She has not had any emergency room visits or hospitalizations for asthma recently. She uses  a albuterol  nebulizer and inhaler for flare-ups and has not experienced recent coughing or wheezing.  She has a history of vocal cord dysfunction, particularly during episodes of bronchitis, but her vocal cords are currently fine. She has been evaluated by an ENT.  For reflux, she takes pantoprazole  and Pepcid , which provide good control of symptoms. She also uses Flonase  as needed for allergies and can take Zyrtec or Claritin if necessary.  She recently underwent surgery on her right foot to shave down a protruding bone that was causing pain when walking and rubbing against shoes. She reports significant improvement post-surgery. Remains on walking boot.   She has been less active due to recent surgeries on her foot  but plans to resume gym activities once her foot heals.  No recent coughing or wheezing. She reports dry mouth, likely due to medications, but no signs of thrush.     Allergies[1]  Immunization History  Administered Date(s) Administered   Fluzone Influenza virus vaccine,trivalent (IIV3), split virus 08/15/2007, 06/26/2008, 07/23/2009, 07/16/2010   H1N1 09/24/2008   Hep A / Hep B 02/20/2018, 04/03/2018, 08/23/2018   Influenza Inj Mdck Quad With Preservative 07/12/2019   Influenza Split 09/24/2008, 07/07/2011, 07/18/2012, 06/12/2013, 07/15/2014, 08/08/2015, 06/22/2016, 07/01/2017, 08/16/2017, 07/14/2018, 07/04/2019, 07/12/2019, 07/26/2019, 07/02/2020   Influenza Whole 08/15/2007, 06/26/2008, 07/23/2009, 07/16/2010   Influenza, Seasonal, Injecte, Preservative Fre 10/26/2011, 07/18/2012, 07/04/2023   Influenza,inj,Quad PF,6+ Mos 06/12/2013, 07/15/2014, 08/08/2015, 06/22/2016, 07/01/2017, 07/14/2018, 07/04/2019, 07/02/2020, 07/07/2021, 06/15/2022   Influenza-Unspecified 09/24/2008, 07/07/2011, 07/18/2012, 06/12/2013, 07/15/2014, 08/08/2015, 06/22/2016, 07/01/2017, 07/14/2018, 07/04/2019, 07/12/2019, 07/26/2019, 07/02/2020   Novel Infuenza-h1n1-09 09/24/2008   PFIZER  Comirnaty(Gray Top)Covid-19 Tri-Sucrose Vaccine 04/15/2021   PFIZER(Purple Top)SARS-COV-2  Vaccination 12/26/2019, 01/16/2020, 04/15/2021, 07/08/2021   Pfizer(Comirnaty)Fall Seasonal Vaccine 12 years and older 07/22/2022   Pneumococcal Conjugate-13 05/06/2014   Pneumococcal Polysaccharide-23 09/30/2008, 02/13/2013   Td 05/21/2010   Td (Adult), 2 Lf Tetanus Toxid, Preservative Free 05/21/2010   Tdap 05/21/2010, 07/02/2020   Unspecified SARS-COV-2 Vaccination 05/12/2023   Zoster Recombinant(Shingrix ) 03/02/2017, 05/02/2017   Zoster, Unspecified 03/02/2017, 05/02/2017    Past Medical History:  Diagnosis Date   Abnormal SPEP 04/17/2014   Acute left ankle pain 01/26/2017   Allergy    ANEMIA-UNSPECIFIED 09/18/2009   Anxiety    Arthritis    Blood transfusion without reported diagnosis    Cataract    CHF (congestive heart failure) (HCC)    Chronic diastolic heart failure, NYHA class 2 (HCC)    Normal LVEDP by May 2018   COPD (chronic obstructive pulmonary disease) (HCC)    Depression 2000   DIABETES MELLITUS, TYPE II 08/21/2006   Diabetic osteomyelitis (HCC) 05/29/2015   Fatty liver    Fracture of 5th metatarsal    non union   GERD 08/21/2006   Had fundoplication   GOUT 08/20/2010   Hammer toe of right foot    2-5th toes   Hx of umbilical hernia repair    HYPERLIPIDEMIA 08/21/2006   HYPERTENSION 08/21/2006   Infection of prosthetic left knee joint 08/29/2020   Infection of wound due to methicillin resistant Staphylococcus aureus (MRSA)    Internal hemorrhoids    Kidney problem    Multiple allergies 10/14/2016   OBESITY 06/04/2009   Onychomycosis 10/27/2015   Osteomyelitis of left foot (HCC) 05/29/2015   Osteoporosis    Pulmonary sarcoidosis    Followed locally by pulmonology, but also by Dr. Lavella at [facility]   Right knee pain 01/26/2017   Vocal cord dysfunction    Wears partial dentures     Tobacco History: Tobacco Use History[2] Counseling given:  Not Answered   Outpatient Medications Prior to Visit  Medication Sig Dispense Refill   albuterol  (PROVENTIL ) (2.5 MG/3ML) 0.083% nebulizer solution Take 3 mLs (2.5 mg total) by nebulization every 6 (six) hours as needed for wheezing or shortness of breath (Use 2-3 times per day when sick.). 75 mL 2   albuterol  (VENTOLIN  HFA) 108 (90 Base) MCG/ACT inhaler Inhale 1-2 puffs into the lungs every 4 (four) hours as needed for wheezing or shortness of breath. 8.5 each 1   aspirin  EC 81 MG tablet Take 1 tablet (81 mg total) by mouth daily. 30 tablet 11   atorvastatin  (LIPITOR) 40 MG tablet TAKE 1 TABLET BY MOUTH EVERY DAY 90 tablet 3   benzonatate  (TESSALON  PERLES) 100 MG capsule Take 1-2 capsules (100-200 mg total) by mouth 3 (three) times daily as needed for cough. 30 capsule 3   buPROPion  (WELLBUTRIN  XL) 300 MG 24 hr tablet TAKE 1 TABLET BY MOUTH EVERY DAY 90 tablet 3   cephALEXin  (KEFLEX ) 500 MG capsule Take 1 capsule (500 mg total) by mouth 4 (four) times daily. 28 capsule 0   chlorpheniramine -HYDROcodone  (TUSSIONEX) 10-8 MG/5ML Take 5 mLs by mouth at bedtime as needed for cough (do not drive for 8 hours after taking). 115 mL 0   Continuous Glucose Sensor (DEXCOM G7 SENSOR) MISC 1 DEVICE BY DOES NOT APPLY ROUTE AS DIRECTED. CHANGE SENSOR EVERY 10 DAYS 3 each 3   cyclobenzaprine  (FLEXERIL ) 5 MG tablet Take 1-2 tablets (5-10 mg total) by mouth 3 (three) times daily as needed for muscle spasms (May cause drowsiness). 30 tablet 0  diltiazem  (CARDIZEM  CD) 120 MG 24 hr capsule TAKE 1 CAPSULE BY MOUTH EVERY DAY 90 capsule 3   estradiol  (ESTRACE ) 0.1 MG/GM vaginal cream Place 0.5 Applicatorfuls vaginally daily as needed (irritation).     famotidine  (PEPCID ) 20 MG tablet TAKE 1 TABLET BY MOUTH TWICE A DAY 180 tablet 1   fenofibrate  160 MG tablet TAKE 1 TABLET BY MOUTH EVERY DAY 90 tablet 2   fluticasone  (FLONASE ) 50 MCG/ACT nasal spray Place 2 sprays into both nostrils daily as needed for allergies. 48 mL 3    fluticasone  furoate-vilanterol (BREO ELLIPTA ) 200-25 MCG/ACT AEPB Inhale 1 puff into the lungs daily. 3 each 3   furosemide  (LASIX ) 40 MG tablet Take 1 tablet (40 mg total) by mouth daily. 90 tablet 3   gabapentin  (NEURONTIN ) 100 MG capsule TAKE 1 CAPSULE BY MOUTH THREE TIMES A DAY 270 capsule 3   glucose blood (CONTOUR NEXT TEST) test strip 1 each by Other route 4 (four) times daily. And lancets 4/day 400 each 3   guaiFENesin -dextromethorphan  (ROBITUSSIN DM) 100-10 MG/5ML syrup Take 5 mLs by mouth every 4 (four) hours as needed for cough (chest congestion). 118 mL 0   Insulin  Pen Needle (PEN NEEDLES) 32G X 4 MM MISC 1 each by Does not apply route 4 (four) times daily. E11.9 400 each 0   ipratropium (ATROVENT ) 0.06 % nasal spray PLACE 2 SPRAYS INTO BOTH NOSTRILS 4 TIMES DAILY. 45 mL 3   KLOR-CON  M20 20 MEQ tablet TAKE 1 & 1/2 TABLETS BY MOUTH TWICE A DAY 270 tablet 2   Lancet Devices (EASY MINI EJECT LANCING DEVICE) MISC Use as directed 4 times daily to test blood sugar 1 each 3   LANTUS  SOLOSTAR 100 UNIT/ML Solostar Pen Inject 24 Units into the skin daily.     metFORMIN  (GLUCOPHAGE -XR) 500 MG 24 hr tablet Take 1 tablet (500 mg total) by mouth 2 (two) times daily with a meal. 180 tablet 0   ondansetron  (ZOFRAN -ODT) 4 MG disintegrating tablet TAKE 1 TABLET BY MOUTH EVERY 8 HOURS AS NEEDED FOR NAUSEA AND VOMITING 20 tablet 0   oxyCODONE -acetaminophen  (PERCOCET) 5-325 MG tablet Take 1 tablet by mouth every 4 (four) hours as needed for severe pain (pain score 7-10). 30 tablet 0   pantoprazole  (PROTONIX ) 40 MG tablet Take 1 tablet (40 mg total) by mouth 2 (two) times daily. Office visit for further refills 180 tablet 0   phenazopyridine  (PYRIDIUM ) 100 MG tablet Take 1 tablet (100 mg total) by mouth 3 (three) times daily as needed for pain. 10 tablet 0   telmisartan  (MICARDIS ) 40 MG tablet TAKE 1 TABLET BY MOUTH EVERY DAY 90 tablet 3   tirzepatide  (MOUNJARO ) 15 MG/0.5ML Pen Inject 15 mg into the skin  once a week. 9 mL 3   traMADol  (ULTRAM ) 50 MG tablet Take 1 tablet (50 mg total) by mouth every 6 (six) hours as needed. 20 tablet 0   venlafaxine  XR (EFFEXOR -XR) 75 MG 24 hr capsule TAKE 1 CAPSULE BY MOUTH TWICE A DAY 180 capsule 3   Calcium  Carbonate (CALTRATE 600 PO) Orally Once a day for 30 day(s) (Patient not taking: Reported on 10/16/2024)     Microlet Lancets MISC 1 each by Does not apply route 2 (two) times daily. E11.9 (Patient not taking: Reported on 10/16/2024) 100 each 2   No facility-administered medications prior to visit.     Review of Systems:   Constitutional:   No  weight loss, night sweats,  Fevers, chills, fatigue, or  lassitude.  HEENT:   No headaches,  Difficulty swallowing,  Tooth/dental problems, or  Sore throat,                No sneezing, itching, ear ache, nasal congestion, post nasal drip,   CV:  No chest pain,  Orthopnea, PND, swelling in lower extremities, anasarca, dizziness, palpitations, syncope.   GI  No heartburn, indigestion, abdominal pain, nausea, vomiting, diarrhea, change in bowel habits, loss of appetite, bloody stools.   Resp: No shortness of breath with exertion or at rest.  No excess mucus, no productive cough,  No non-productive cough,  No coughing up of blood.  No change in color of mucus.  No wheezing.  No chest wall deformity  Skin: no rash or lesions.  GU: no dysuria, change in color of urine, no urgency or frequency.  No flank pain, no hematuria   MS:  foot surgery/walking boot .    Physical Exam  BP 109/65   Pulse 74   Temp 97.7 F (36.5 C)   Ht 5' 6 (1.676 m) Comment: Per pt  Wt 172 lb 3.2 oz (78.1 kg)   SpO2 97% Comment: RA  BMI 27.79 kg/m   GEN: A/Ox3; pleasant , NAD, well nourished    HEENT:  [place]/AT,    NOSE-clear, THROAT-clear, no lesions, no postnasal drip or exudate noted.   NECK:  Supple w/ fair ROM; no JVD; normal carotid impulses w/o bruits; no thyromegaly or nodules palpated; no lymphadenopathy.    RESP  Clear   P & A; w/o, wheezes/ rales/ or rhonchi. no accessory muscle use, no dullness to percussion  CARD:  RRR, no m/r/g, no peripheral edema, pulses intact, no cyanosis or clubbing.  GI:   Soft & nt; nml bowel sounds; no organomegaly or masses detected.   Musco: Warm bil, walking boot right   Neuro: alert, no focal deficits noted.    Skin: Warm, no lesions or rashes    Lab Results:Reviewed 10/16/2024   CBC   BMET   BNP    Imaging:   Administration History     None          Latest Ref Rng & Units 10/14/2022    3:25 PM 02/12/2016    8:55 AM  PFT Results  FVC-Pre L 2.32  2.12   FVC-Predicted Pre % 66  58   FVC-Post L 2.61  2.13   FVC-Predicted Post % 75  58   Pre FEV1/FVC % % 83  83   Post FEV1/FCV % % 86  87   FEV1-Pre L 1.93  1.75   FEV1-Predicted Pre % 72  61   FEV1-Post L 2.26  1.85   DLCO uncorrected ml/min/mmHg 20.21  17.67   DLCO UNC% % 94  65   DLCO corrected ml/min/mmHg 20.21  18.87   DLCO COR %Predicted % 94  70   DLVA Predicted % 117  100   TLC L 5.03  4.01   TLC % Predicted % 93  75   RV % Predicted % 92  80     No results found for: NITRICOXIDE      No data to display              Assessment & Plan:   Assessment and Plan Assessment & Plan Moderate persistent asthma  -well controlled  Asthma is well-controlled with no recent emergency room visits or hospitalizations. No current exacerbations or symptoms like wheezing or coughing.  Continue Breo one puff  daily and use albuterol  as needed. Use a nebulizer during flare-ups.  Flonase  and Zyrtec as needed during allergy season. Encourage activity, starting with five minutes and building up to thirty minutes daily when able .  Check PFT on return   Sarcoidosis-appears in remission  Sarcoidosis remains in remission with no recent flare She has been off prednisone  for over four years. A CT scan in 2023 showed clear lungs with no lymph node involvement. Continue routine monitoring with an annual  follow-up  Check PFT on return . Chest xray today.   Gastroesophageal reflux disease   Reflux is well-controlled with the current medication regimen.  . Reflux is a potential trigger for asthma and vocal cord issues. Continue pantoprazole  and use Pepcid    Allergic rhinitis   Managed with Flonase  and Zyrtec as needed. Allergy season is approaching, which may trigger asthma symptoms.         Madelin Stank, NP 10/16/2024     [1]  Allergies Allergen Reactions   Firvanq  [Vancomycin ] Other (See Comments)    Dose related nephrotoxicity    Methotrexate Other (See Comments)    Peri-oral and buccal lesions   Lisinopril  Cough   Chlorhexidine  Itching   Cleocin  [Clindamycin ] Nausea And Vomiting and Rash   Lincocin [Lincomycin Hcl] Nausea And Vomiting and Rash   Teflaro [Ceftaroline] Rash and Other (See Comments)    Tolerates ceftriaxone    [2]  Social History Tobacco Use  Smoking Status Never  Smokeless Tobacco Never   "

## 2024-10-17 ENCOUNTER — Encounter

## 2024-10-24 ENCOUNTER — Ambulatory Visit: Admitting: Podiatry

## 2024-10-24 ENCOUNTER — Encounter: Payer: Self-pay | Admitting: Podiatry

## 2024-10-24 ENCOUNTER — Ambulatory Visit (INDEPENDENT_AMBULATORY_CARE_PROVIDER_SITE_OTHER): Admitting: Family

## 2024-10-24 ENCOUNTER — Encounter

## 2024-10-24 ENCOUNTER — Ambulatory Visit

## 2024-10-24 ENCOUNTER — Encounter: Payer: Self-pay | Admitting: Family

## 2024-10-24 VITALS — Ht 66.0 in | Wt 172.2 lb

## 2024-10-24 VITALS — BP 120/62 | HR 83 | Temp 97.5°F | Ht 66.0 in | Wt 168.5 lb

## 2024-10-24 DIAGNOSIS — J0110 Acute frontal sinusitis, unspecified: Secondary | ICD-10-CM

## 2024-10-24 DIAGNOSIS — G4452 New daily persistent headache (NDPH): Secondary | ICD-10-CM | POA: Diagnosis not present

## 2024-10-24 DIAGNOSIS — M898X7 Other specified disorders of bone, ankle and foot: Secondary | ICD-10-CM

## 2024-10-24 MED ORDER — AMOXICILLIN-POT CLAVULANATE 875-125 MG PO TABS: 1.0000 | ORAL_TABLET | Freq: Two times a day (BID) | ORAL | 0 refills | Status: DC

## 2024-10-24 NOTE — Progress Notes (Signed)
 "  Chief Complaint  Patient presents with   Routine Post Op    POV #2 DOS 09/27/2024 RT METATARSAL HEAD RES 5TH, pt states everything is going well, has no pain or complaints.    Subjective:  Patient presents today status post fifth metatarsal tubercle resection right foot.  DOS: 09/27/2024.  Doing well.  Past Medical History:  Diagnosis Date   Abnormal SPEP 04/17/2014   Acute left ankle pain 01/26/2017   Allergy    ANEMIA-UNSPECIFIED 09/18/2009   Anxiety    Arthritis    Blood transfusion without reported diagnosis    Cataract    CHF (congestive heart failure) (HCC)    Chronic diastolic heart failure, NYHA class 2 (HCC)    Normal LVEDP by May 2018   COPD (chronic obstructive pulmonary disease) (HCC)    Depression 2000   DIABETES MELLITUS, TYPE II 08/21/2006   Diabetic osteomyelitis (HCC) 05/29/2015   Fatty liver    Fracture of 5th metatarsal    non union   GERD 08/21/2006   Had fundoplication   GOUT 08/20/2010   Hammer toe of right foot    2-5th toes   Hx of umbilical hernia repair    HYPERLIPIDEMIA 08/21/2006   HYPERTENSION 08/21/2006   Infection of prosthetic left knee joint 08/29/2020   Infection of wound due to methicillin resistant Staphylococcus aureus (MRSA)    Internal hemorrhoids    Kidney problem    Multiple allergies 10/14/2016   OBESITY 06/04/2009   Onychomycosis 10/27/2015   Osteomyelitis of left foot (HCC) 05/29/2015   Osteoporosis    Pulmonary sarcoidosis    Followed locally by pulmonology, but also by Dr. Lavella at [facility]   Right knee pain 01/26/2017   Vocal cord dysfunction    Wears partial dentures     Past Surgical History:  Procedure Laterality Date   ABDOMINAL HYSTERECTOMY     APPENDECTOMY     BLADDER SUSPENSION  11/11/2011   Procedure: TRANSVAGINAL TAPE (TVT) PROCEDURE;  Surgeon: Oneil FORBES Piety, MD;  Location: [facility];  Service: Gynecology;  Laterality: N/A;   CAROTIDS  02/18/2011   CAROTID DUPLEX; VERTEBRALS ARE  PATENT WITH ANTEGRADE FLOW. ICA/CCA RATIO 1.61 ON RIGHT AND 0.75 ON LEFT   CATARACT EXTRACTION, BILATERAL     CHOLECYSTECTOMY  1984   COLONOSCOPY  04/29/2010   [facility]   CYSTOSCOPY  11/11/2011   Procedure: CYSTOSCOPY;  Surgeon: Oneil FORBES Piety, MD;  Location: [facility];  Service: Gynecology;  Laterality: N/A;   EXTUBATION (ENDOTRACHEAL) IN OR N/A 09/23/2017   Procedure: EXTUBATION (ENDOTRACHEAL) IN OR;  Surgeon: Terri Alan PARAS, MD;  Location: [facility] OR;  Service: ENT;  Laterality: N/A;   FIBEROPTIC LARYNGOSCOPY AND TRACHEOSCOPY N/A 09/23/2017   Procedure: FLEXIBLE FIBEROPTIC LARYNGOSCOPY;  Surgeon: Terri Alan PARAS, MD;  Location: [facility] OR;  Service: ENT;  Laterality: N/A;   FRACTURE SURGERY     foot   HALLUX FUSION Left 10/02/2018   Procedure: HALLUX ARTHRODESIS;  Surgeon: Janit Thresa HERO, DPM;  Location: [facility];  Service: Podiatry;  Laterality: Left;   HAMMER TOE SURGERY Left 10/02/2018   Procedure: HAMMER TOE CORRECTION 2ND 3RD 4RD FIFTH TOE;  Surgeon: Janit Thresa HERO, DPM;  Location: [facility];  Service: Podiatry;  Laterality: Left;   HAMMER TOE SURGERY Right 04/12/2019   Procedure: HAMMER TOE CORRECTION, 2ND, 3RD, 4TH AND 5TH TOES OF RIGHT FOOT;  Surgeon: Janit Thresa HERO, DPM;  Location: [facility];  Service: Podiatry;  Laterality: Right;   HAMMER  TOE SURGERY Left 10/25/2019   Procedure: HAMMER TOE REPAIR SECOND, THIRD, FOUTH;  Surgeon: Janit Thresa HERO, DPM;  Location: [facility];  Service: Podiatry;  Laterality: Left;   HERNIA REPAIR     I & D EXTREMITY Left 06/27/2015   Procedure: Partial Excision Left Calcaneus, Place Antibiotic Beads, and Wound VAC;  Surgeon: Jerona Harden GAILS, MD;  Location: [facility];  Service: Orthopedics;  Laterality: Left;   JOINT REPLACEMENT     KNEE ARTHROSCOPY     right   LAPAROSCOPIC ASSISTED VENTRAL HERNIA REPAIR N/A 03/26/2024   Procedure: REPAIR, HERNIA, VENTRAL, LAPAROSCOPY-ASSISTED;  Surgeon: Rubin Calamity, MD;  Location: [facility];  Service: General;  Laterality: N/A;  LAP  VENTRAL HERNIA REPAIR WITH MESH   LAPAROSCOPIC LYSIS OF ADHESIONS N/A 03/26/2024   Procedure: LYSIS, ADHESIONS, LAPAROSCOPIC;  Surgeon: Rubin Calamity, MD;  Location: [facility] OR;  Service: General;  Laterality: N/A;   LAPAROSCOPY N/A 03/26/2024   Procedure: LAPAROSCOPY, DIAGNOSTIC;  Surgeon: Rubin Calamity, MD;  Location[facility] OR;  Service: General;  Laterality: N/A;   LEFT AND RIGHT HEART CATHETERIZATION WITH CORONARY ANGIOGRAM N/A 04/23/2013   Procedure: LEFT AND RIGHT HEART CATHETERIZATION WITH CORONARY ANGIOGRAM;  Surgeon: Alm LELON Clay, MD;  Location: [facility] CATH LAB;  Service: Cardiovascular;  Laterality: N/A;   LEFT HEART CATH AND CORONARY ANGIOGRAPHY N/A 03/11/2017   Procedure: Left Heart Cath and Coronary Angiography;  Surgeon: Wonda Sharper, MD;  Location: [facility] INVASIVE CV LAB; angiographically minimal CAD in the LAD otherwise normal.;  Normal LVEDP.  FALSE POSITIVE MYOVIEW    LEFT HEART CATH AND CORONARY ANGIOGRAPHY  07/20/2010   LVEF 50-55% WITH VERY MILD GLOBAL HYPOKINESIA; ESSENTIALLY NORMAL CORONARY ARTERIES; NORMAL LV FUNCTION   METATARSAL OSTEOTOMY WITH OPEN REDUCTION INTERNAL FIXATION (ORIF) METATARSAL WITH FUSION Left 04/09/2014   Procedure: LEFT FOOT FRACTURE OPEN TREATMENT METATARSAL INCLUDES INTERNAL FIXATION EACH;  Surgeon: Lamar DELENA Millman, MD;  Location: [facility];  Service: Orthopedics;  Laterality: Left;   NISSEN FUNDOPLICATION  2004   NM MYOVIEW  LTD --> FALSE POSITIVE  03/10/2017   : Moderate size stress-induced perfusion defect at the apex as well as ill-defined stress-induced perfusion defect in the lateral wall.  EF 55%.  INTERMEDIATE risk. -->  FALSE POSITIVE   ORIF DISTAL FEMUR FRACTURE  08/08/2020   [facility]: ORIF of left extra-articular periprosthetic distal femur fracture with synthesis of the femur plate with cortical nonlocking screws.  (Thought to be pathologic fracture due to osteoporosis) -> after a fall   ORIF FEMUR W/ PERI-IMPLANT   08/29/2020   [facility]) revision of left femur fracture ORIF plate fixation screws; culture positive for MSSA   Right and left CARDIAC CATHETERIZATION  04/23/2013   Angiographic normal coronaries; LVEDP 20 mmHg, PCWP 12-14 mmHg, RAP 12 mmHg.; Fick CO/CI 4.9/2.2   ROTATOR CUFF REPAIR Right 12/09/2021   SHOULDER ARTHROSCOPY Left 03/14/2019   Procedure: LEFT SHOULDER ARTHROSCOPY, DEBRIDEMENT, AND DECOMPRESSION;  Surgeon: Harden Jerona GAILS, MD;  Location: [facility] OR;  Service: Orthopedics;  Laterality: Left;   TOTAL KNEE ARTHROPLASTY Right 06/29/2017   Procedure: RIGHT TOTAL KNEE ARTHROPLASTY;  Surgeon: Harden Jerona GAILS, MD;  Location: [facility] OR;  Service: Orthopedics;  Laterality: Right;   TOTAL KNEE ARTHROPLASTY Left 12/07/2017   TOTAL KNEE ARTHROPLASTY Left 12/07/2017   Procedure: LEFT TOTAL KNEE ARTHROPLASTY;  Surgeon: Harden Jerona GAILS, MD;  Location: [facility] OR;  Service: Orthopedics;  Laterality: Left;   TRACHEOSTOMY TUBE PLACEMENT N/A 09/20/2017   Procedure: AWAKE  INTUBATION WITH ANESTHESIA WITH VIDEO ASSISTANCE;  Surgeon: Terri Alan PARAS, MD;  Location: [facility] OR;  Service: ENT;  Laterality: N/A;   TRANSTHORACIC ECHOCARDIOGRAM  08/2014   Normal LV size and function.  Mild LVH.  EF 55-60%.  Normal regional wall motion.  GR 1 DD.  Normal RV size and function .   TRANSTHORACIC ECHOCARDIOGRAM  08/12/2020    [facility]): Normal LV size and function.  EF 55 to 60%.  No RWM A.  Normal filling pattern.  Normal RV.  No significant valvular disease-stenosis or regurgitation.  No vegetation.   TUBAL LIGATION     with reversal in 1994   UPPER GASTROINTESTINAL ENDOSCOPY     VENTRAL HERNIA REPAIR      Allergies[1]  Objective/Physical Exam Neurovascular status intact.  Incision well coapted with staples intact. No sign of infectious process noted. No dehiscence. No active bleeding noted.   Radiographic Exam RT foot 10/03/2024:  Interval resection of the hypertrophic fifth metatarsal tubercle  noted with clean osteotomies.  No complicating features noted  Assessment: 1. s/p fifth metatarsal tubercle exostectomy right. DOS: 09/27/2024   Plan of Care:  -Patient was evaluated.  Overall the patient is doing very well -Staples removed -Continue WBAT cam boot -Recommend Ace wrap daily.  Ace wrap's provided -In 2 weeks the patient and begin to transition out of the cam boot to good supportive tennis shoes and sneakers.  Ideally I would like to see her about 1 week after she has been wearing tennis shoes -Return to clinic 3 weeks  Thresa EMERSON Sar, DPM [facility]  Dr. Thresa EMERSON Sar, DPM    [address] [place] [address]                Office [phone]  Fax [phone]          [1]  Allergies Allergen Reactions   Firvanq  [Vancomycin ] Other (See Comments)    Dose related nephrotoxicity    Methotrexate Other (See Comments)    Peri-oral and buccal lesions   Lisinopril  Cough   Chlorhexidine  Itching   Cleocin  [Clindamycin ] Nausea And Vomiting and Rash   Lincocin [Lincomycin Hcl] Nausea And Vomiting and Rash   Teflaro [Ceftaroline] Rash and Other (See Comments)    Tolerates ceftriaxone     "

## 2024-10-24 NOTE — Progress Notes (Signed)
 "  Patient ID: [name], female    DOB: [birth year], 64 y.o.   MRN: [id number]  Chief Complaint  Patient presents with   Headache    Patient c/o headaches, present for 1 week. Has tried tylenol , which does not help sx and causes abdominal aching.   Discussed the use of AI scribe software for clinical note transcription with the patient, who gave verbal consent to proceed.  History of Present Illness [name] is a 64 year old female with sarcoidosis who presents with persistent headaches and gastrointestinal symptoms.  She has had severe intermittent headaches for the past week, located in her forehead without facial or dental radiation and not relieved by Tylenol . She describes a pressure sensation with headaches occurring at various times day and night, but denies any nasal congestion or drainage. Her husband had flu confirmed last weekend and now has a congestive cough. She started having sx with stomach pain/diarrhea, headache, chills, malaise. She feels cold but has not had a fever. She received a flu shot this year. She is on Mounjaro  15 mg and cannot take ibuprofen  or Aleve  because of stomach issues. She had bunion and bone shaving foot surgery about a month ago and is no longer using oxycodone  or taking Keflex . She finds the postoperative boot uncomfortable. She uses inhalers for sarcoidosis and nasal sprays including Flonase  and ipratropium as needed. She used Flonase  last night for her headache with uncertain benefit. She denies ear pain or stuffiness but has felt dizzy at times, off-balance, as if she might fall.  Assessment & Plan Acute sinusitis Intermittent headaches with sinus pressure likely due to acute sinusitis. Symptoms suggest mild viral infection, possibly flu, but sx started a week ago. Headaches not relieved by Tylenol . Possible vertigo related to viral illness. - Continue Flonase  nasal spray, one squirt each side twice a day x3d, then qd until sx  resolved. - Prescribed Augmentin  (amoxicillin /clavulanate) after eating x5d, start on Friday if no relief from Flonase . - Advised hydration and symptomatic relief. - Call back if sx are persisting after finishing abt.  Persistent headache Believe related to sinus pressure, flu-like sx, no hx of headaches.  - Advised to use Flonase  bid x3d, if not relief, stop using. - Also sending Augmentin  to treat possible sinus infection, advised to wait 1 day and see if Flonase  helping headache & no need to take antibiotic if helping.    Subjective:    Outpatient Medications Prior to Visit  Medication Sig Dispense Refill   albuterol  (PROVENTIL ) (2.5 MG/3ML) 0.083% nebulizer solution Take 3 mLs (2.5 mg total) by nebulization every 6 (six) hours as needed for wheezing or shortness of breath (Use 2-3 times per day when sick.). 75 mL 2   albuterol  (VENTOLIN  HFA) 108 (90 Base) MCG/ACT inhaler Inhale 1-2 puffs into the lungs every 4 (four) hours as needed for wheezing or shortness of breath. 3 each 1   aspirin  EC 81 MG tablet Take 1 tablet (81 mg total) by mouth daily. 30 tablet 11   atorvastatin  (LIPITOR) 40 MG tablet TAKE 1 TABLET BY MOUTH EVERY DAY 90 tablet 3   benzonatate  (TESSALON  PERLES) 100 MG capsule Take 1-2 capsules (100-200 mg total) by mouth 3 (three) times daily as needed for cough. 30 capsule 3   buPROPion  (WELLBUTRIN  XL) 300 MG 24 hr tablet TAKE 1 TABLET BY MOUTH EVERY DAY 90 tablet 3   Calcium  Carbonate (CALTRATE 600 PO) Orally Once a day for 30 day(s)  cephALEXin  (KEFLEX ) 500 MG capsule Take 1 capsule (500 mg total) by mouth 4 (four) times daily. 28 capsule 0   chlorpheniramine -HYDROcodone  (TUSSIONEX) 10-8 MG/5ML Take 5 mLs by mouth at bedtime as needed for cough (do not drive for 8 hours after taking). 115 mL 0   Continuous Glucose Sensor (DEXCOM G7 SENSOR) MISC 1 DEVICE BY DOES NOT APPLY ROUTE AS DIRECTED. CHANGE SENSOR EVERY 10 DAYS 3 each 3   cyclobenzaprine  (FLEXERIL ) 5 MG tablet  Take 1-2 tablets (5-10 mg total) by mouth 3 (three) times daily as needed for muscle spasms (May cause drowsiness). 30 tablet 0   diltiazem  (CARDIZEM  CD) 120 MG 24 hr capsule TAKE 1 CAPSULE BY MOUTH EVERY DAY 90 capsule 3   estradiol  (ESTRACE ) 0.1 MG/GM vaginal cream Place 0.5 Applicatorfuls vaginally daily as needed (irritation).     famotidine  (PEPCID ) 20 MG tablet TAKE 1 TABLET BY MOUTH TWICE A DAY 180 tablet 1   fenofibrate  160 MG tablet TAKE 1 TABLET BY MOUTH EVERY DAY 90 tablet 2   fluticasone  (FLONASE ) 50 MCG/ACT nasal spray Place 2 sprays into both nostrils daily as needed for allergies. 48 mL 3   fluticasone  furoate-vilanterol (BREO ELLIPTA ) 200-25 MCG/ACT AEPB Inhale 1 puff into the lungs daily. 3 each 3   furosemide  (LASIX ) 40 MG tablet Take 1 tablet (40 mg total) by mouth daily. 90 tablet 3   gabapentin  (NEURONTIN ) 100 MG capsule TAKE 1 CAPSULE BY MOUTH THREE TIMES A DAY 270 capsule 3   glucose blood (CONTOUR NEXT TEST) test strip 1 each by Other route 4 (four) times daily. And lancets 4/day 400 each 3   guaiFENesin -dextromethorphan  (ROBITUSSIN DM) 100-10 MG/5ML syrup Take 5 mLs by mouth every 4 (four) hours as needed for cough (chest congestion). 118 mL 0   Insulin  Pen Needle (PEN NEEDLES) 32G X 4 MM MISC 1 each by Does not apply route 4 (four) times daily. E11.9 400 each 0   ipratropium (ATROVENT ) 0.06 % nasal spray PLACE 2 SPRAYS INTO BOTH NOSTRILS 4 TIMES DAILY. 45 mL 3   KLOR-CON  M20 20 MEQ tablet TAKE 1 & 1/2 TABLETS BY MOUTH TWICE A DAY 270 tablet 2   Lancet Devices (EASY MINI EJECT LANCING DEVICE) MISC Use as directed 4 times daily to test blood sugar 1 each 3   LANTUS  SOLOSTAR 100 UNIT/ML Solostar Pen Inject 24 Units into the skin daily.     metFORMIN  (GLUCOPHAGE -XR) 500 MG 24 hr tablet Take 1 tablet (500 mg total) by mouth 2 (two) times daily with a meal. 180 tablet 0   Microlet Lancets MISC 1 each by Does not apply route 2 (two) times daily. E11.9 100 each 2   ondansetron   (ZOFRAN -ODT) 4 MG disintegrating tablet TAKE 1 TABLET BY MOUTH EVERY 8 HOURS AS NEEDED FOR NAUSEA AND VOMITING 20 tablet 0   oxyCODONE -acetaminophen  (PERCOCET) 5-325 MG tablet Take 1 tablet by mouth every 4 (four) hours as needed for severe pain (pain score 7-10). 30 tablet 0   pantoprazole  (PROTONIX ) 40 MG tablet Take 1 tablet (40 mg total) by mouth 2 (two) times daily. Office visit for further refills 180 tablet 0   phenazopyridine  (PYRIDIUM ) 100 MG tablet Take 1 tablet (100 mg total) by mouth 3 (three) times daily as needed for pain. 10 tablet 0   telmisartan  (MICARDIS ) 40 MG tablet TAKE 1 TABLET BY MOUTH EVERY DAY 90 tablet 3   tirzepatide  (MOUNJARO ) 15 MG/0.5ML Pen Inject 15 mg into the skin once a week. 9  mL 3   traMADol  (ULTRAM ) 50 MG tablet Take 1 tablet (50 mg total) by mouth every 6 (six) hours as needed. 20 tablet 0   venlafaxine  XR (EFFEXOR -XR) 75 MG 24 hr capsule TAKE 1 CAPSULE BY MOUTH TWICE A DAY 180 capsule 3   No facility-administered medications prior to visit.   Past Medical History:  Diagnosis Date   Abnormal SPEP 04/17/2014   Acute left ankle pain 01/26/2017   Allergy    ANEMIA-UNSPECIFIED 09/18/2009   Anxiety    Arthritis    Blood transfusion without reported diagnosis    Cataract    CHF (congestive heart failure) (HCC)    Chronic diastolic heart failure, NYHA class 2 (HCC)    Normal LVEDP by May 2018   COPD (chronic obstructive pulmonary disease) (HCC)    Depression 2000   DIABETES MELLITUS, TYPE II 08/21/2006   Diabetic osteomyelitis (HCC) 05/29/2015   Fatty liver    Fracture of 5th metatarsal    non union   GERD 08/21/2006   Had fundoplication   GOUT 08/20/2010   Hammer toe of right foot    2-5th toes   Hx of umbilical hernia repair    HYPERLIPIDEMIA 08/21/2006   HYPERTENSION 08/21/2006   Infection of prosthetic left knee joint 08/29/2020   Infection of wound due to methicillin resistant Staphylococcus aureus (MRSA)    Internal hemorrhoids    Kidney  problem    Multiple allergies 10/14/2016   OBESITY 06/04/2009   Onychomycosis 10/27/2015   Osteomyelitis of left foot (HCC) 05/29/2015   Osteoporosis    Pulmonary sarcoidosis    Followed locally by pulmonology, but also by Dr. Lavella at [facility]   Right knee pain 01/26/2017   Vocal cord dysfunction    Wears partial dentures    Past Surgical History:  Procedure Laterality Date   ABDOMINAL HYSTERECTOMY     APPENDECTOMY     BLADDER SUSPENSION  11/11/2011   Procedure: TRANSVAGINAL TAPE (TVT) PROCEDURE;  Surgeon: Oneil FORBES Piety, MD;  Location: [facility];  Service: Gynecology;  Laterality: N/A;   CAROTIDS  02/18/2011   CAROTID DUPLEX; VERTEBRALS ARE PATENT WITH ANTEGRADE FLOW. ICA/CCA RATIO 1.61 ON RIGHT AND 0.75 ON LEFT   CATARACT EXTRACTION, BILATERAL     CHOLECYSTECTOMY  1984   COLONOSCOPY  04/29/2010   [facility]   CYSTOSCOPY  11/11/2011   Procedure: CYSTOSCOPY;  Surgeon: Oneil FORBES Piety, MD;  Location: [facility];  Service: Gynecology;  Laterality: N/A;   EXTUBATION (ENDOTRACHEAL) IN OR N/A 09/23/2017   Procedure: EXTUBATION (ENDOTRACHEAL) IN OR;  Surgeon: Terri Alan PARAS, MD;  Location: [facility];  Service: ENT;  Laterality: N/A;   FIBEROPTIC LARYNGOSCOPY AND TRACHEOSCOPY N/A 09/23/2017   Procedure: FLEXIBLE FIBEROPTIC LARYNGOSCOPY;  Surgeon: Terri Alan PARAS, MD;  Location: [facility];  Service: ENT;  Laterality: N/A;   FRACTURE SURGERY     foot   HALLUX FUSION Left 10/02/2018   Procedure: HALLUX ARTHRODESIS;  Surgeon: Janit Thresa HERO, DPM;  Location: [facility];  Service: Podiatry;  Laterality: Left;   HAMMER TOE SURGERY Left 10/02/2018   Procedure: HAMMER TOE CORRECTION 2ND 3RD 4RD FIFTH TOE;  Surgeon: Janit Thresa HERO, DPM;  Location: [facility];  Service: Podiatry;  Laterality: Left;   HAMMER TOE SURGERY Right 04/12/2019   Procedure: HAMMER TOE CORRECTION, 2ND, 3RD, 4TH AND 5TH TOES OF RIGHT FOOT;  Surgeon: Janit Thresa HERO, DPM;  Location: [facility];  Service: Podiatry;  Laterality:  Right;   HAMMER TOE SURGERY  Left 10/25/2019   Procedure: HAMMER TOE REPAIR SECOND, THIRD, FOUTH;  Surgeon: Janit Thresa HERO, DPM;  Location: [facility];  Service: Podiatry;  Laterality: Left;   HERNIA REPAIR     I & D EXTREMITY Left 06/27/2015   Procedure: Partial Excision Left Calcaneus, Place Antibiotic Beads, and Wound VAC;  Surgeon: Jerona Harden GAILS, MD;  Location: [facility];  Service: Orthopedics;  Laterality: Left;   JOINT REPLACEMENT     KNEE ARTHROSCOPY     right   LAPAROSCOPIC ASSISTED VENTRAL HERNIA REPAIR N/A 03/26/2024   Procedure: REPAIR, HERNIA, VENTRAL, LAPAROSCOPY-ASSISTED;  Surgeon: Rubin Calamity, MD;  Location: [facility];  Service: General;  Laterality: N/A;  LAP VENTRAL HERNIA REPAIR WITH MESH   LAPAROSCOPIC LYSIS OF ADHESIONS N/A 03/26/2024   Procedure: LYSIS, ADHESIONS, LAPAROSCOPIC;  Surgeon: Rubin Calamity, MD;  Location: [facility] OR;  Service: General;  Laterality: N/A;   LAPAROSCOPY N/A 03/26/2024   Procedure: LAPAROSCOPY, DIAGNOSTIC;  Surgeon: Rubin Calamity, MD;  Location: [facility] OR;  Service: General;  Laterality: N/A;   LEFT AND RIGHT HEART CATHETERIZATION WITH CORONARY ANGIOGRAM N/A 04/23/2013   Procedure: LEFT AND RIGHT HEART CATHETERIZATION WITH CORONARY ANGIOGRAM;  Surgeon: Alm LELON Clay, MD;  Location: [facility] CATH LAB;  Service: Cardiovascular;  Laterality: N/A;   LEFT HEART CATH AND CORONARY ANGIOGRAPHY N/A 03/11/2017   Procedure: Left Heart Cath and Coronary Angiography;  Surgeon: Wonda Sharper, MD;  Location: [facility] INVASIVE CV LAB; angiographically minimal CAD in the LAD otherwise normal.;  Normal LVEDP.  FALSE POSITIVE MYOVIEW    LEFT HEART CATH AND CORONARY ANGIOGRAPHY  07/20/2010   LVEF 50-55% WITH VERY MILD GLOBAL HYPOKINESIA; ESSENTIALLY NORMAL CORONARY ARTERIES; NORMAL LV FUNCTION   METATARSAL OSTEOTOMY WITH OPEN REDUCTION INTERNAL FIXATION (ORIF) METATARSAL WITH FUSION Left 04/09/2014   Procedure: LEFT FOOT FRACTURE OPEN TREATMENT METATARSAL INCLUDES INTERNAL FIXATION EACH;   Surgeon: Lamar DELENA Millman, MD;  Location: [facility];  Service: Orthopedics;  Laterality: Left;   NISSEN FUNDOPLICATION  2004   NM MYOVIEW  LTD --> FALSE POSITIVE  03/10/2017   : Moderate size stress-induced perfusion defect at the apex as well as ill-defined stress-induced perfusion defect in the lateral wall.  EF 55%.  INTERMEDIATE risk. -->  FALSE POSITIVE   ORIF DISTAL FEMUR FRACTURE  08/08/2020   [facility]: ORIF of left extra-articular periprosthetic distal femur fracture with synthesis of the femur plate with cortical nonlocking screws.  (Thought to be pathologic fracture due to osteoporosis) -> after a fall   ORIF FEMUR W/ PERI-IMPLANT  08/29/2020   [facility] [id number] [facility]) revision of left femur fracture ORIF plate fixation screws; culture positive for MSSA   Right and left CARDIAC CATHETERIZATION  04/23/2013   Angiographic normal coronaries; LVEDP 20 mmHg, PCWP 12-14 mmHg, RAP 12 mmHg.; Fick CO/CI 4.9/2.2   ROTATOR CUFF REPAIR Right 12/09/2021   SHOULDER ARTHROSCOPY Left 03/14/2019   Procedure: LEFT SHOULDER ARTHROSCOPY, DEBRIDEMENT, AND DECOMPRESSION;  Surgeon: Harden Jerona GAILS, MD;  Location: [facility] OR;  Service: Orthopedics;  Laterality: Left;   TOTAL KNEE ARTHROPLASTY Right 06/29/2017   Procedure: RIGHT TOTAL KNEE ARTHROPLASTY;  Surgeon: Harden Jerona GAILS, MD;  Location: [facility] OR;  Service: Orthopedics;  Laterality: Right;   TOTAL KNEE ARTHROPLASTY Left 12/07/2017   TOTAL KNEE ARTHROPLASTY Left 12/07/2017   Procedure: LEFT TOTAL KNEE ARTHROPLASTY;  Surgeon: Harden Jerona GAILS, MD;  Location: [facility] OR;  Service: Orthopedics;  Laterality: Left;   TRACHEOSTOMY TUBE PLACEMENT N/A 09/20/2017   Procedure: AWAKE INTUBATION WITH ANESTHESIA  WITH VIDEO ASSISTANCE;  Surgeon: Terri Alan PARAS, MD;  Location: [facility] OR;  Service: ENT;  Laterality: N/A;   TRANSTHORACIC ECHOCARDIOGRAM  08/2014   Normal LV size and function.  Mild LVH.  EF 55-60%.  Normal regional wall motion.  GR 1 DD.   Normal RV size and function .   TRANSTHORACIC ECHOCARDIOGRAM  08/12/2020    [facility]): Normal LV size and function.  EF 55 to 60%.  No RWM A.  Normal filling pattern.  Normal RV.  No significant valvular disease-stenosis or regurgitation.  No vegetation.   TUBAL LIGATION     with reversal in 1994   UPPER GASTROINTESTINAL ENDOSCOPY     VENTRAL HERNIA REPAIR     Allergies[1]    Objective:    Physical Exam Vitals and nursing note reviewed.  Constitutional:      Appearance: Normal appearance. She is ill-appearing.     Interventions: Face mask in place.  HENT:     Right Ear: Tympanic membrane and ear canal normal.     Left Ear: Tympanic membrane and ear canal normal.     Nose:     Right Sinus: Frontal sinus tenderness present.     Left Sinus: Frontal sinus tenderness present.     Mouth/Throat:     Mouth: Mucous membranes are moist.     Pharynx: Posterior oropharyngeal erythema (mild) present. No pharyngeal swelling, oropharyngeal exudate or uvula swelling.     Tonsils: No tonsillar exudate or tonsillar abscesses.  Cardiovascular:     Rate and Rhythm: Normal rate and regular rhythm.  Pulmonary:     Effort: Pulmonary effort is normal.     Breath sounds: Normal breath sounds.  Musculoskeletal:        General: Normal range of motion.  Lymphadenopathy:     Head:     Right side of head: No preauricular or posterior auricular adenopathy.     Left side of head: No preauricular or posterior auricular adenopathy.     Cervical: No cervical adenopathy.  Skin:    General: Skin is warm and dry.  Neurological:     Mental Status: She is alert.  Psychiatric:        Mood and Affect: Mood normal.        Behavior: Behavior normal.    BP 120/62 (BP Location: Left Arm, Patient Position: Sitting, Cuff Size: Normal)   Pulse 83   Temp (!) 97.5 F (36.4 C) (Temporal)   Ht 5' 6 (1.676 m)   Wt 168 lb 8 oz (76.4 kg)   SpO2 99%   BMI 27.20 kg/m  Wt Readings from Last 3  Encounters:  10/24/24 168 lb 8 oz (76.4 kg)  10/24/24 172 lb 3.2 oz (78.1 kg)  10/16/24 172 lb 3.2 oz (78.1 kg)      Madalina Rosman, NP     [1]  Allergies Allergen Reactions   Firvanq  [Vancomycin ] Other (See Comments)    Dose related nephrotoxicity    Methotrexate Other (See Comments)    Peri-oral and buccal lesions   Lisinopril  Cough   Chlorhexidine  Itching   Cleocin  [Clindamycin ] Nausea And Vomiting and Rash   Lincocin [Lincomycin Hcl] Nausea And Vomiting and Rash   Teflaro [Ceftaroline] Rash and Other (See Comments)    Tolerates ceftriaxone     "

## 2024-10-26 ENCOUNTER — Ambulatory Visit: Payer: Self-pay | Admitting: Adult Health

## 2024-10-29 ENCOUNTER — Ambulatory Visit: Payer: Self-pay

## 2024-10-29 NOTE — Telephone Encounter (Signed)
 Patient scheduled to see Sheppard Buttner 10/30/2024. Dr. Katrinka will review triage notes.

## 2024-10-29 NOTE — Telephone Encounter (Signed)
 FYI Only or Action Required?: FYI only for provider: appointment scheduled on 1/20.  Patient was last seen in primary care on 10/24/2024 by Lucius Krabbe, NP.  Called Nurse Triage reporting Sinusitis.  Symptoms began several days ago.  Interventions attempted: OTC medications: Tylenol .  Symptoms are: gradually worsening.  Triage Disposition: See Today or Tomorrow in Office (overriding Home Care)  Patient/caregiver understands and will follow disposition?: Yes  Reason for Triage: patient has sinus issues and feels light head, dizziness when standing   Reason for Disposition  [1] Sinus congestion as part of a cold AND [2] present < 10 days  Answer Assessment - Initial Assessment Questions 1. LOCATION: Where does it hurt?      forehead 2. ONSET: When did the sinus pain start?  (e.g., hours, days)      A week ago 3. SEVERITY: How bad is the pain?   (Scale 0-10; or none, mild, moderate or severe)     moderate 4. RECURRENT SYMPTOM: Have you ever had sinus problems before? If Yes, ask: When was the last time? and What happened that time?       5. NASAL CONGESTION: Is the nose blocked? If Yes, ask: Can you open it or must you breathe through your mouth?     yes 6. NASAL DISCHARGE: Do you have discharge from your nose? If so ask, What color?     Yes, white 7. FEVER: Do you have a fever? If Yes, ask: What is it, how was it measured, and when did it start?      no 8. OTHER SYMPTOMS: Do you have any other symptoms? (e.g., sore throat, cough, earache, difficulty breathing)     denies  Protocols used: Sinus Pain or Congestion-A-AH

## 2024-10-30 ENCOUNTER — Ambulatory Visit: Admitting: Physician Assistant

## 2024-10-30 ENCOUNTER — Encounter: Payer: Self-pay | Admitting: Physician Assistant

## 2024-10-30 VITALS — BP 120/68 | HR 92 | Temp 97.9°F | Ht 66.0 in | Wt 167.0 lb

## 2024-10-30 DIAGNOSIS — R5383 Other fatigue: Secondary | ICD-10-CM | POA: Diagnosis not present

## 2024-10-30 DIAGNOSIS — J0110 Acute frontal sinusitis, unspecified: Secondary | ICD-10-CM

## 2024-10-30 LAB — CBC WITH DIFFERENTIAL/PLATELET
Basophils Absolute: 0.1 K/uL (ref 0.0–0.1)
Basophils Relative: 0.5 % (ref 0.0–3.0)
Eosinophils Absolute: 0.3 K/uL (ref 0.0–0.7)
Eosinophils Relative: 3 % (ref 0.0–5.0)
HCT: 33.7 % — ABNORMAL LOW (ref 36.0–46.0)
Hemoglobin: 11.4 g/dL — ABNORMAL LOW (ref 12.0–15.0)
Lymphocytes Relative: 30.4 % (ref 12.0–46.0)
Lymphs Abs: 3.1 K/uL (ref 0.7–4.0)
MCHC: 33.7 g/dL (ref 30.0–36.0)
MCV: 89.6 fl (ref 78.0–100.0)
Monocytes Absolute: 0.6 K/uL (ref 0.1–1.0)
Monocytes Relative: 6.3 % (ref 3.0–12.0)
Neutro Abs: 6.2 K/uL (ref 1.4–7.7)
Neutrophils Relative %: 59.8 % (ref 43.0–77.0)
Platelets: 325 K/uL (ref 150.0–400.0)
RBC: 3.76 Mil/uL — ABNORMAL LOW (ref 3.87–5.11)
RDW: 12.9 % (ref 11.5–15.5)
WBC: 10.3 K/uL (ref 4.0–10.5)

## 2024-10-30 LAB — IBC + FERRITIN
Ferritin: 224.4 ng/mL (ref 10.0–291.0)
Iron: 75 ug/dL (ref 42–145)
Saturation Ratios: 17.6 % — ABNORMAL LOW (ref 20.0–50.0)
TIBC: 427 ug/dL (ref 250.0–450.0)
Transferrin: 305 mg/dL (ref 212.0–360.0)

## 2024-10-30 LAB — COMPREHENSIVE METABOLIC PANEL WITH GFR
ALT: 22 U/L (ref 3–35)
AST: 30 U/L (ref 5–37)
Albumin: 4.4 g/dL (ref 3.5–5.2)
Alkaline Phosphatase: 41 U/L (ref 39–117)
BUN: 20 mg/dL (ref 6–23)
CO2: 30 meq/L (ref 19–32)
Calcium: 9.6 mg/dL (ref 8.4–10.5)
Chloride: 100 meq/L (ref 96–112)
Creatinine, Ser: 1.51 mg/dL — ABNORMAL HIGH (ref 0.40–1.20)
GFR: 36.12 mL/min — ABNORMAL LOW
Glucose, Bld: 131 mg/dL — ABNORMAL HIGH (ref 70–99)
Potassium: 4.7 meq/L (ref 3.5–5.1)
Sodium: 138 meq/L (ref 135–145)
Total Bilirubin: 0.5 mg/dL (ref 0.2–1.2)
Total Protein: 6.9 g/dL (ref 6.0–8.3)

## 2024-10-30 LAB — VITAMIN B12: Vitamin B-12: 445 pg/mL (ref 211–911)

## 2024-10-30 LAB — TSH: TSH: 1.1 u[IU]/mL (ref 0.35–5.50)

## 2024-10-30 MED ORDER — PREDNISONE 20 MG PO TABS: 20.0000 mg | ORAL_TABLET | Freq: Every day | ORAL | 0 refills | Status: AC

## 2024-10-30 MED ORDER — LEVOFLOXACIN 500 MG PO TABS: 500.0000 mg | ORAL_TABLET | Freq: Every day | ORAL | 0 refills | Status: AC

## 2024-10-30 NOTE — Progress Notes (Signed)
 "  History of Present Illness:   Chief Complaint  Patient presents with   Sinus Problem    Pt c/o sinus pain and pressure, dizziness, x 5 days ago. She was seen on 1/14     Discussed the use of AI scribe software for clinical note transcription with the patient, who gave verbal consent to proceed.  History of Present Illness   [name] is a 64 year old female who presents with persistent sinus symptoms and dizziness.  She has had sinus symptoms and dizziness for six days, starting while her husband was recovering from the flu. She has a persistent cough, fatigue, and head pressure that worsens when standing and makes her feel like she might pass out. Nasal discharge is clear. She denies chest tightness or wheezing.  She has used nasal sprays (Flonase  and atrovent ) consistently and completed a five-day course of Augmentin  without relief. Tylenol  has not helped. She notes crackling and popping in her ears but no severe sore throat or marked nasal congestion.  She recently had foot surgery and was prescribed cephalexin  to prevent infection. She takes metformin  and avoids NSAIDs because of kidney concerns, though she has taken some ibuprofen  recently. She has B12 deficiency treated previously with injections and low iron  that required infusions in 2021. She complains of ongoing fatigue and would like blood work.  Past Medical History:  Diagnosis Date   Abnormal SPEP 04/17/2014   Acute left ankle pain 01/26/2017   Allergy    ANEMIA-UNSPECIFIED 09/18/2009   Anxiety    Arthritis    Blood transfusion without reported diagnosis    Cataract    CHF (congestive heart failure) (HCC)    Chronic diastolic heart failure, NYHA class 2 (HCC)    Normal LVEDP by May 2018   COPD (chronic obstructive pulmonary disease) (HCC)    Depression 2000   DIABETES MELLITUS, TYPE II 08/21/2006   Diabetic osteomyelitis (HCC) 05/29/2015   Fatty liver    Fracture of 5th metatarsal    non union   GERD  08/21/2006   Had fundoplication   GOUT 08/20/2010   Hammer toe of right foot    2-5th toes   Hx of umbilical hernia repair    HYPERLIPIDEMIA 08/21/2006   HYPERTENSION 08/21/2006   Infection of prosthetic left knee joint 08/29/2020   Infection of wound due to methicillin resistant Staphylococcus aureus (MRSA)    Internal hemorrhoids    Kidney problem    Multiple allergies 10/14/2016   OBESITY 06/04/2009   Onychomycosis 10/27/2015   Osteomyelitis of left foot (HCC) 05/29/2015   Osteoporosis    Pulmonary sarcoidosis    Followed locally by pulmonology, but also by Dr. Lavella at [facility]   Right knee pain 01/26/2017   Vocal cord dysfunction    Wears partial dentures      Social History[1]  Past Surgical History:  Procedure Laterality Date   ABDOMINAL HYSTERECTOMY     APPENDECTOMY     BLADDER SUSPENSION  11/11/2011   Procedure: TRANSVAGINAL TAPE (TVT) PROCEDURE;  Surgeon: Oneil FORBES Piety, MD;  Location: [facility];  Service: Gynecology;  Laterality: N/A;   CAROTIDS  02/18/2011   CAROTID DUPLEX; VERTEBRALS ARE PATENT WITH ANTEGRADE FLOW. ICA/CCA RATIO 1.61 ON RIGHT AND 0.75 ON LEFT   CATARACT EXTRACTION, BILATERAL     CHOLECYSTECTOMY  1984   COLONOSCOPY  04/29/2010   [facility]   CYSTOSCOPY  11/11/2011   Procedure: CYSTOSCOPY;  Surgeon: Oneil FORBES Piety, MD;  Location: [facility]  ORS;  Service: Gynecology;  Laterality: N/A;   EXTUBATION (ENDOTRACHEAL) IN OR N/A 09/23/2017   Procedure: EXTUBATION (ENDOTRACHEAL) IN OR;  Surgeon: Terri Alan PARAS, MD;  Location: [facility] OR;  Service: ENT;  Laterality: N/A;   FIBEROPTIC LARYNGOSCOPY AND TRACHEOSCOPY N/A 09/23/2017   Procedure: FLEXIBLE FIBEROPTIC LARYNGOSCOPY;  Surgeon: Terri Alan PARAS, MD;  Location: [facility] OR;  Service: ENT;  Laterality: N/A;   FRACTURE SURGERY     foot   HALLUX FUSION Left 10/02/2018   Procedure: HALLUX ARTHRODESIS;  Surgeon: Janit Thresa HERO, DPM;  Location: [facility];  Service: Podiatry;  Laterality: Left;   HAMMER  TOE SURGERY Left 10/02/2018   Procedure: HAMMER TOE CORRECTION 2ND 3RD 4RD FIFTH TOE;  Surgeon: Janit Thresa HERO, DPM;  Location: [facility];  Service: Podiatry;  Laterality: Left;   HAMMER TOE SURGERY Right 04/12/2019   Procedure: HAMMER TOE CORRECTION, 2ND, 3RD, 4TH AND 5TH TOES OF RIGHT FOOT;  Surgeon: Janit Thresa HERO, DPM;  Location: [facility];  Service: Podiatry;  Laterality: Right;   HAMMER TOE SURGERY Left 10/25/2019   Procedure: HAMMER TOE REPAIR SECOND, THIRD, FOUTH;  Surgeon: Janit Thresa HERO, DPM;  Location: [facility];  Service: Podiatry;  Laterality: Left;   HERNIA REPAIR     I & D EXTREMITY Left 06/27/2015   Procedure: Partial Excision Left Calcaneus, Place Antibiotic Beads, and Wound VAC;  Surgeon: Jerona Harden GAILS, MD;  Location: [facility];  Service: Orthopedics;  Laterality: Left;   JOINT REPLACEMENT     KNEE ARTHROSCOPY     right   LAPAROSCOPIC ASSISTED VENTRAL HERNIA REPAIR N/A 03/26/2024   Procedure: REPAIR, HERNIA, VENTRAL, LAPAROSCOPY-ASSISTED;  Surgeon: Rubin Calamity, MD;  Location: [facility] OR;  Service: General;  Laterality: N/A;  LAP VENTRAL HERNIA REPAIR WITH MESH   LAPAROSCOPIC LYSIS OF ADHESIONS N/A 03/26/2024   Procedure: LYSIS, ADHESIONS, LAPAROSCOPIC;  Surgeon: Rubin Calamity, MD;  Location: [facility] OR;  Service: General;  Laterality: N/A;   LAPAROSCOPY N/A 03/26/2024   Procedure: LAPAROSCOPY, DIAGNOSTIC;  Surgeon: Rubin Calamity, MD;  Location: [facility] OR;  Service: General;  Laterality: N/A;   LEFT AND RIGHT HEART CATHETERIZATION WITH CORONARY ANGIOGRAM N/A 04/23/2013   Procedure: LEFT AND RIGHT HEART CATHETERIZATION WITH CORONARY ANGIOGRAM;  Surgeon: Alm LELON Clay, MD;  Location: [facility] CATH LAB;  Service: Cardiovascular;  Laterality: N/A;   LEFT HEART CATH AND CORONARY ANGIOGRAPHY N/A 03/11/2017   Procedure: Left Heart Cath and Coronary Angiography;  Surgeon: Wonda Sharper, MD;  Location: [facility] INVASIVE CV LAB; angiographically minimal CAD in the LAD otherwise normal.;  Normal LVEDP.  FALSE POSITIVE  MYOVIEW    LEFT HEART CATH AND CORONARY ANGIOGRAPHY  07/20/2010   LVEF 50-55% WITH VERY MILD GLOBAL HYPOKINESIA; ESSENTIALLY NORMAL CORONARY ARTERIES; NORMAL LV FUNCTION   METATARSAL OSTEOTOMY WITH OPEN REDUCTION INTERNAL FIXATION (ORIF) METATARSAL WITH FUSION Left 04/09/2014   Procedure: LEFT FOOT FRACTURE OPEN TREATMENT METATARSAL INCLUDES INTERNAL FIXATION EACH;  Surgeon: Lamar DELENA Millman, MD;  Location: [facility];  Service: Orthopedics;  Laterality: Left;   NISSEN FUNDOPLICATION  2004   NM MYOVIEW  LTD --> FALSE POSITIVE  03/10/2017   : Moderate size stress-induced perfusion defect at the apex as well as ill-defined stress-induced perfusion defect in the lateral wall.  EF 55%.  INTERMEDIATE risk. -->  FALSE POSITIVE   ORIF DISTAL FEMUR FRACTURE  08/08/2020   [facility]: ORIF of left extra-articular periprosthetic distal femur fracture with synthesis of the femur plate with cortical nonlocking screws.  (Thought to  be pathologic fracture due to osteoporosis) -> after a fall   ORIF FEMUR W/ PERI-IMPLANT  08/29/2020   [facility]) revision of left femur fracture ORIF plate fixation screws; culture positive for MSSA   Right and left CARDIAC CATHETERIZATION  04/23/2013   Angiographic normal coronaries; LVEDP 20 mmHg, PCWP 12-14 mmHg, RAP 12 mmHg.; Fick CO/CI 4.9/2.2   ROTATOR CUFF REPAIR Right 12/09/2021   SHOULDER ARTHROSCOPY Left 03/14/2019   Procedure: LEFT SHOULDER ARTHROSCOPY, DEBRIDEMENT, AND DECOMPRESSION;  Surgeon: Harden Jerona GAILS, MD;  Location: [facility] OR;  Service: Orthopedics;  Laterality: Left;   TOTAL KNEE ARTHROPLASTY Right 06/29/2017   Procedure: RIGHT TOTAL KNEE ARTHROPLASTY;  Surgeon: Harden Jerona GAILS, MD;  Location: [facility] OR;  Service: Orthopedics;  Laterality: Right;   TOTAL KNEE ARTHROPLASTY Left 12/07/2017   TOTAL KNEE ARTHROPLASTY Left 12/07/2017   Procedure: LEFT TOTAL KNEE ARTHROPLASTY;  Surgeon: Harden Jerona GAILS, MD;  Location: [facility] OR;  Service:  Orthopedics;  Laterality: Left;   TRACHEOSTOMY TUBE PLACEMENT N/A 09/20/2017   Procedure: AWAKE INTUBATION WITH ANESTHESIA WITH VIDEO ASSISTANCE;  Surgeon: Terri Alan PARAS, MD;  Location: [facility] OR;  Service: ENT;  Laterality: N/A;   TRANSTHORACIC ECHOCARDIOGRAM  08/2014   Normal LV size and function.  Mild LVH.  EF 55-60%.  Normal regional wall motion.  GR 1 DD.  Normal RV size and function .   TRANSTHORACIC ECHOCARDIOGRAM  08/12/2020    [facility]): Normal LV size and function.  EF 55 to 60%.  No RWM A.  Normal filling pattern.  Normal RV.  No significant valvular disease-stenosis or regurgitation.  No vegetation.   TUBAL LIGATION     with reversal in 1994   UPPER GASTROINTESTINAL ENDOSCOPY     VENTRAL HERNIA REPAIR      Family History  Problem Relation Age of Onset   Diabetes Father    Heart attack Father 59   Coronary artery disease Father    Heart failure Father    Throat cancer Father    Hypertension Father    Heart disease Father    Sleep apnea Father    Obesity Father    COPD Mother    Emphysema Mother    Asthma Mother    Heart failure Mother    Breast cancer Mother    Diabetes Mother    Kidney disease Mother    Thyroid  disease Mother    Heart attack Maternal Grandfather    Sarcoidosis Maternal Uncle    Lung cancer Brother    Diabetes Brother    Diabetes Brother    Colon cancer Neg Hx    Colon polyps Neg Hx    Esophageal cancer Neg Hx    Rectal cancer Neg Hx    Stomach cancer Neg Hx     Allergies[2]  Current Medications:  Current Medications[3]   Review of Systems:   Negative unless otherwise specified per HPI.  Vitals:   Vitals:   10/30/24 0901  BP: 120/68  Pulse: 92  Temp: 97.9 F (36.6 C)  TempSrc: Temporal  Weight: 167 lb (75.8 kg)  Height: 5' 6 (1.676 m)     Body mass index is 26.95 kg/m.  Physical Exam:   Physical Exam Vitals and nursing note reviewed.  Constitutional:      General: She is not in acute distress.     Appearance: She is well-developed. She is not ill-appearing or toxic-appearing.  HENT:     Head: Normocephalic and atraumatic.  Right Ear: Ear canal and external ear normal. A middle ear effusion is present. Tympanic membrane is not erythematous, retracted or bulging.     Left Ear: Ear canal and external ear normal. A middle ear effusion is present. Tympanic membrane is not erythematous, retracted or bulging.     Nose:     Right Sinus: Frontal sinus tenderness present. No maxillary sinus tenderness.     Left Sinus: Frontal sinus tenderness present. No maxillary sinus tenderness.     Mouth/Throat:     Pharynx: Uvula midline. No posterior oropharyngeal erythema.  Eyes:     General: Lids are normal.     Conjunctiva/sclera: Conjunctivae normal.  Neck:     Trachea: Trachea normal.  Cardiovascular:     Rate and Rhythm: Normal rate and regular rhythm.     Heart sounds: Normal heart sounds, S1 normal and S2 normal.  Pulmonary:     Effort: Pulmonary effort is normal.     Breath sounds: Normal breath sounds. No decreased breath sounds, wheezing, rhonchi or rales.  Lymphadenopathy:     Cervical: No cervical adenopathy.  Skin:    General: Skin is warm and dry.  Neurological:     Mental Status: She is alert.  Psychiatric:        Speech: Speech normal.        Behavior: Behavior normal. Behavior is cooperative.     Assessment and Plan:   Assessment and Plan    Acute frontal sinusitis Persistent symptoms despite Augmentin . No improvement. Question if at her last visit she was positive for the flu. - Prescribed levofloxacin  for sinusitis. - Prescribed 20 mg prednisone  daily x 5 days for sinus pressure/eustachian tube dysfunction  - Continue Flonase  and Atrovent  nasal sprays daily. - Consider dedicated sinus CT if still no improvement of symptom(s)   Other fatigue Persistent fatigue with unclear etiology. Possible iron  deficiency anemia, B12 deficiency, vitamin D  deficiency. Question if  at her last visit she was positive for the flu. - Ordered blood tests for B12, iron , thyroid  -- declines need for urine screening - Follow up based on results and clinical symptom(s)   Lucie Buttner, PA-C    [1]  Social History Tobacco Use   Smoking status: Never   Smokeless tobacco: Never  Vaping Use   Vaping status: Never Used  Substance Use Topics   Alcohol use: No   Drug use: No  [2]  Allergies Allergen Reactions   Firvanq  [Vancomycin ] Other (See Comments)    Dose related nephrotoxicity    Methotrexate Other (See Comments)    Peri-oral and buccal lesions   Lisinopril  Cough   Chlorhexidine  Itching   Cleocin  [Clindamycin ] Nausea And Vomiting and Rash   Lincocin [Lincomycin Hcl] Nausea And Vomiting and Rash   Teflaro [Ceftaroline] Rash and Other (See Comments)    Tolerates ceftriaxone    [3]  Current Outpatient Medications:    albuterol  (PROVENTIL ) (2.5 MG/3ML) 0.083% nebulizer solution, Take 3 mLs (2.5 mg total) by nebulization every 6 (six) hours as needed for wheezing or shortness of breath (Use 2-3 times per day when sick.)., Disp: 75 mL, Rfl: 2   albuterol  (VENTOLIN  HFA) 108 (90 Base) MCG/ACT inhaler, Inhale 1-2 puffs into the lungs every 4 (four) hours as needed for wheezing or shortness of breath., Disp: 3 each, Rfl: 1   aspirin  EC 81 MG tablet, Take 1 tablet (81 mg total) by mouth daily., Disp: 30 tablet, Rfl: 11   atorvastatin  (LIPITOR) 40 MG tablet, TAKE 1 TABLET BY  MOUTH EVERY DAY, Disp: 90 tablet, Rfl: 3   benzonatate  (TESSALON  PERLES) 100 MG capsule, Take 1-2 capsules (100-200 mg total) by mouth 3 (three) times daily as needed for cough., Disp: 30 capsule, Rfl: 3   buPROPion  (WELLBUTRIN  XL) 300 MG 24 hr tablet, TAKE 1 TABLET BY MOUTH EVERY DAY, Disp: 90 tablet, Rfl: 3   Calcium  Carbonate (CALTRATE 600 PO), Orally Once a day for 30 day(s), Disp: , Rfl:    chlorpheniramine -HYDROcodone  (TUSSIONEX) 10-8 MG/5ML, Take 5 mLs by mouth at bedtime as needed for cough (do  not drive for 8 hours after taking)., Disp: 115 mL, Rfl: 0   Continuous Glucose Sensor (DEXCOM G7 SENSOR) MISC, 1 DEVICE BY DOES NOT APPLY ROUTE AS DIRECTED. CHANGE SENSOR EVERY 10 DAYS, Disp: 3 each, Rfl: 3   cyclobenzaprine  (FLEXERIL ) 5 MG tablet, Take 1-2 tablets (5-10 mg total) by mouth 3 (three) times daily as needed for muscle spasms (May cause drowsiness)., Disp: 30 tablet, Rfl: 0   diltiazem  (CARDIZEM  CD) 120 MG 24 hr capsule, TAKE 1 CAPSULE BY MOUTH EVERY DAY, Disp: 90 capsule, Rfl: 3   estradiol  (ESTRACE ) 0.1 MG/GM vaginal cream, Place 0.5 Applicatorfuls vaginally daily as needed (irritation)., Disp: , Rfl:    famotidine  (PEPCID ) 20 MG tablet, TAKE 1 TABLET BY MOUTH TWICE A DAY, Disp: 180 tablet, Rfl: 1   fenofibrate  160 MG tablet, TAKE 1 TABLET BY MOUTH EVERY DAY, Disp: 90 tablet, Rfl: 2   fluticasone  (FLONASE ) 50 MCG/ACT nasal spray, Place 2 sprays into both nostrils daily as needed for allergies., Disp: 48 mL, Rfl: 3   fluticasone  furoate-vilanterol (BREO ELLIPTA ) 200-25 MCG/ACT AEPB, Inhale 1 puff into the lungs daily., Disp: 3 each, Rfl: 3   furosemide  (LASIX ) 40 MG tablet, Take 1 tablet (40 mg total) by mouth daily., Disp: 90 tablet, Rfl: 3   gabapentin  (NEURONTIN ) 100 MG capsule, TAKE 1 CAPSULE BY MOUTH THREE TIMES A DAY, Disp: 270 capsule, Rfl: 3   glucose blood (CONTOUR NEXT TEST) test strip, 1 each by Other route 4 (four) times daily. And lancets 4/day, Disp: 400 each, Rfl: 3   Insulin  Pen Needle (PEN NEEDLES) 32G X 4 MM MISC, 1 each by Does not apply route 4 (four) times daily. E11.9, Disp: 400 each, Rfl: 0   ipratropium (ATROVENT ) 0.06 % nasal spray, PLACE 2 SPRAYS INTO BOTH NOSTRILS 4 TIMES DAILY., Disp: 45 mL, Rfl: 3   KLOR-CON  M20 20 MEQ tablet, TAKE 1 & 1/2 TABLETS BY MOUTH TWICE A DAY, Disp: 270 tablet, Rfl: 2   Lancet Devices (EASY MINI EJECT LANCING DEVICE) MISC, Use as directed 4 times daily to test blood sugar, Disp: 1 each, Rfl: 3   LANTUS  SOLOSTAR 100 UNIT/ML Solostar  Pen, Inject 24 Units into the skin daily., Disp: , Rfl:    levofloxacin  (LEVAQUIN ) 500 MG tablet, Take 1 tablet (500 mg total) by mouth daily for 5 days., Disp: 5 tablet, Rfl: 0   metFORMIN  (GLUCOPHAGE -XR) 500 MG 24 hr tablet, Take 1 tablet (500 mg total) by mouth 2 (two) times daily with a meal., Disp: 180 tablet, Rfl: 0   Microlet Lancets MISC, 1 each by Does not apply route 2 (two) times daily. E11.9, Disp: 100 each, Rfl: 2   ondansetron  (ZOFRAN -ODT) 4 MG disintegrating tablet, TAKE 1 TABLET BY MOUTH EVERY 8 HOURS AS NEEDED FOR NAUSEA AND VOMITING, Disp: 20 tablet, Rfl: 0   oxyCODONE -acetaminophen  (PERCOCET) 5-325 MG tablet, Take 1 tablet by mouth every 4 (four) hours as needed for  severe pain (pain score 7-10)., Disp: 30 tablet, Rfl: 0   pantoprazole  (PROTONIX ) 40 MG tablet, Take 1 tablet (40 mg total) by mouth 2 (two) times daily. Office visit for further refills, Disp: 180 tablet, Rfl: 0   predniSONE  (DELTASONE ) 20 MG tablet, Take 1 tablet (20 mg total) by mouth daily with breakfast., Disp: 10 tablet, Rfl: 0   telmisartan  (MICARDIS ) 40 MG tablet, TAKE 1 TABLET BY MOUTH EVERY DAY, Disp: 90 tablet, Rfl: 3   tirzepatide  (MOUNJARO ) 15 MG/0.5ML Pen, Inject 15 mg into the skin once a week., Disp: 9 mL, Rfl: 3   traMADol  (ULTRAM ) 50 MG tablet, Take 1 tablet (50 mg total) by mouth every 6 (six) hours as needed., Disp: 20 tablet, Rfl: 0   venlafaxine  XR (EFFEXOR -XR) 75 MG 24 hr capsule, TAKE 1 CAPSULE BY MOUTH TWICE A DAY, Disp: 180 capsule, Rfl: 3  "

## 2024-10-31 ENCOUNTER — Ambulatory Visit: Payer: Self-pay | Admitting: Physician Assistant

## 2024-11-07 ENCOUNTER — Encounter: Admitting: Podiatry

## 2024-11-09 ENCOUNTER — Ambulatory Visit
Admission: RE | Admit: 2024-11-09 | Discharge: 2024-11-09 | Disposition: A | Source: Ambulatory Visit | Attending: Family Medicine | Admitting: Family Medicine

## 2024-11-09 DIAGNOSIS — Z1231 Encounter for screening mammogram for malignant neoplasm of breast: Secondary | ICD-10-CM

## 2024-11-12 ENCOUNTER — Other Ambulatory Visit

## 2024-11-15 ENCOUNTER — Other Ambulatory Visit

## 2024-11-16 LAB — COMPREHENSIVE METABOLIC PANEL WITH GFR
AG Ratio: 2.3 (calc) (ref 1.0–2.5)
ALT: 20 U/L (ref 6–29)
AST: 24 U/L (ref 10–35)
Albumin: 4.4 g/dL (ref 3.6–5.1)
Alkaline phosphatase (APISO): 48 U/L (ref 37–153)
BUN/Creatinine Ratio: 16 (calc) (ref 6–22)
BUN: 26 mg/dL — ABNORMAL HIGH (ref 7–25)
CO2: 29 mmol/L (ref 20–32)
Calcium: 9.6 mg/dL (ref 8.6–10.4)
Chloride: 104 mmol/L (ref 98–110)
Creat: 1.59 mg/dL — ABNORMAL HIGH (ref 0.50–1.05)
Globulin: 1.9 g/dL (ref 1.9–3.7)
Glucose, Bld: 85 mg/dL (ref 65–99)
Potassium: 4.8 mmol/L (ref 3.5–5.3)
Sodium: 141 mmol/L (ref 135–146)
Total Bilirubin: 0.5 mg/dL (ref 0.2–1.2)
Total Protein: 6.3 g/dL (ref 6.1–8.1)
eGFR: 36 mL/min/{1.73_m2} — ABNORMAL LOW

## 2024-11-16 LAB — MICROALBUMIN / CREATININE URINE RATIO
Creatinine, Urine: 94 mg/dL (ref 20–275)
Microalb Creat Ratio: 6 mg/g{creat}
Microalb, Ur: 0.6 mg/dL

## 2024-11-16 LAB — LIPID PANEL
Cholesterol: 142 mg/dL
HDL: 49 mg/dL — ABNORMAL LOW
LDL Cholesterol (Calc): 68 mg/dL
Non-HDL Cholesterol (Calc): 93 mg/dL
Total CHOL/HDL Ratio: 2.9 (calc)
Triglycerides: 168 mg/dL — ABNORMAL HIGH

## 2024-11-19 ENCOUNTER — Encounter: Admitting: Podiatry

## 2024-11-19 ENCOUNTER — Ambulatory Visit: Admitting: "Endocrinology

## 2025-01-08 ENCOUNTER — Encounter: Admitting: Family Medicine

## 2025-02-18 ENCOUNTER — Encounter: Admitting: Family Medicine

## 2025-04-09 ENCOUNTER — Ambulatory Visit
